# Patient Record
Sex: Female | Born: 1945
Health system: Southern US, Community
[De-identification: ages and names within clinical notes are randomized; demographics above are authoritative.]

## PROBLEM LIST (undated history)

## (undated) DIAGNOSIS — Z8701 Personal history of pneumonia (recurrent): Secondary | ICD-10-CM

## (undated) DIAGNOSIS — I4891 Unspecified atrial fibrillation: Secondary | ICD-10-CM

## (undated) DIAGNOSIS — Z87312 Personal history of (healed) stress fracture: Secondary | ICD-10-CM

## (undated) DIAGNOSIS — K56609 Unspecified intestinal obstruction, unspecified as to partial versus complete obstruction: Secondary | ICD-10-CM

## (undated) DIAGNOSIS — Z8614 Personal history of Methicillin resistant Staphylococcus aureus infection: Secondary | ICD-10-CM

## (undated) DIAGNOSIS — I420 Dilated cardiomyopathy: Secondary | ICD-10-CM

## (undated) DIAGNOSIS — I2510 Atherosclerotic heart disease of native coronary artery without angina pectoris: Secondary | ICD-10-CM

## (undated) DIAGNOSIS — M797 Fibromyalgia: Secondary | ICD-10-CM

## (undated) DIAGNOSIS — I10 Essential (primary) hypertension: Secondary | ICD-10-CM

## (undated) DIAGNOSIS — K3184 Gastroparesis: Secondary | ICD-10-CM

## (undated) DIAGNOSIS — K5792 Diverticulitis of intestine, part unspecified, without perforation or abscess without bleeding: Secondary | ICD-10-CM

## (undated) DIAGNOSIS — S42402A Unspecified fracture of lower end of left humerus, initial encounter for closed fracture: Secondary | ICD-10-CM

## (undated) DIAGNOSIS — J189 Pneumonia, unspecified organism: Secondary | ICD-10-CM

## (undated) DIAGNOSIS — M1990 Unspecified osteoarthritis, unspecified site: Secondary | ICD-10-CM

## (undated) DIAGNOSIS — D631 Anemia in chronic kidney disease: Secondary | ICD-10-CM

## (undated) DIAGNOSIS — T17900A Unspecified foreign body in respiratory tract, part unspecified causing asphyxiation, initial encounter: Secondary | ICD-10-CM

## (undated) DIAGNOSIS — J449 Chronic obstructive pulmonary disease, unspecified: Secondary | ICD-10-CM

## (undated) DIAGNOSIS — J90 Pleural effusion, not elsewhere classified: Secondary | ICD-10-CM

## (undated) DIAGNOSIS — E538 Deficiency of other specified B group vitamins: Secondary | ICD-10-CM

## (undated) DIAGNOSIS — D649 Anemia, unspecified: Secondary | ICD-10-CM

## (undated) DIAGNOSIS — Z950 Presence of cardiac pacemaker: Secondary | ICD-10-CM

## (undated) DIAGNOSIS — Z9981 Dependence on supplemental oxygen: Secondary | ICD-10-CM

## (undated) DIAGNOSIS — I447 Left bundle-branch block, unspecified: Secondary | ICD-10-CM

## (undated) DIAGNOSIS — I509 Heart failure, unspecified: Secondary | ICD-10-CM

## (undated) DIAGNOSIS — K759 Inflammatory liver disease, unspecified: Secondary | ICD-10-CM

## (undated) DIAGNOSIS — Z8719 Personal history of other diseases of the digestive system: Secondary | ICD-10-CM

## (undated) DIAGNOSIS — K909 Intestinal malabsorption, unspecified: Secondary | ICD-10-CM

## (undated) DIAGNOSIS — I82B19 Acute embolism and thrombosis of unspecified subclavian vein: Secondary | ICD-10-CM

## (undated) DIAGNOSIS — Z973 Presence of spectacles and contact lenses: Secondary | ICD-10-CM

## (undated) DIAGNOSIS — Z8711 Personal history of peptic ulcer disease: Secondary | ICD-10-CM

## (undated) DIAGNOSIS — K219 Gastro-esophageal reflux disease without esophagitis: Secondary | ICD-10-CM

## (undated) DIAGNOSIS — G2581 Restless legs syndrome: Secondary | ICD-10-CM

## (undated) HISTORY — DX: Left bundle-branch block, unspecified: I44.7

## (undated) HISTORY — PX: GASTROCUTANEOUS FISTULA CLOSURE: SHX1695

## (undated) HISTORY — DX: Dilated cardiomyopathy: I42.0

## (undated) HISTORY — DX: Atherosclerotic heart disease of native coronary artery without angina pectoris: I25.10

## (undated) HISTORY — DX: Presence of cardiac pacemaker: Z95.0

## (undated) HISTORY — PX: INTERSTIM IMPLANT PLACEMENT: SHX5130

## (undated) HISTORY — DX: Gastroparesis: K31.84

## (undated) HISTORY — DX: Acute embolism and thrombosis of unspecified subclavian vein: I82.B19

## (undated) HISTORY — DX: Gastro-esophageal reflux disease without esophagitis: K21.9

## (undated) HISTORY — PX: CARDIAC CATHETERIZATION: SHX172

## (undated) HISTORY — DX: Anemia in chronic kidney disease: D63.1

## (undated) HISTORY — DX: Deficiency of other specified B group vitamins: E53.8

## (undated) HISTORY — DX: Chronic obstructive pulmonary disease, unspecified: J44.9

## (undated) HISTORY — DX: Personal history of other diseases of the digestive system: Z87.19

## (undated) HISTORY — PX: APPENDECTOMY: SHX54

## (undated) HISTORY — DX: Intestinal malabsorption, unspecified: K90.9

## (undated) HISTORY — DX: Fibromyalgia: M79.7

## (undated) HISTORY — DX: Unspecified foreign body in respiratory tract, part unspecified causing asphyxiation, initial encounter: T17.900A

## (undated) HISTORY — DX: Unspecified atrial fibrillation: I48.91

## (undated) HISTORY — PX: TOTAL ABDOMINAL HYSTERECTOMY: SHX209

## (undated) HISTORY — DX: Dependence on supplemental oxygen: Z99.81

## (undated) HISTORY — DX: Unspecified intestinal obstruction, unspecified as to partial versus complete obstruction: K56.609

## (undated) HISTORY — DX: Pleural effusion, not elsewhere classified: J90

## (undated) HISTORY — DX: Essential (primary) hypertension: I10

## (undated) HISTORY — DX: Anemia, unspecified: D64.9

## (undated) HISTORY — DX: Pneumonia, unspecified organism: J18.9

## (undated) HISTORY — PX: TONSILLECTOMY: SUR1361

## (undated) HISTORY — PX: OTHER SURGICAL HISTORY: SHX169

## (undated) HISTORY — DX: Restless legs syndrome: G25.81

## (undated) HISTORY — PX: COLECTOMY: SHX59

## (undated) SURGERY — Surgical Case
Anesthesia: *Unknown

---

## 1997-07-27 ENCOUNTER — Inpatient Hospital Stay (HOSPITAL_COMMUNITY): Admission: EM | Admit: 1997-07-27 | Discharge: 1997-08-03 | Payer: Self-pay | Admitting: Emergency Medicine

## 1998-03-12 ENCOUNTER — Inpatient Hospital Stay (HOSPITAL_COMMUNITY): Admission: EM | Admit: 1998-03-12 | Discharge: 1998-03-18 | Payer: Self-pay | Admitting: *Deleted

## 1998-03-12 ENCOUNTER — Encounter: Payer: Self-pay | Admitting: General Surgery

## 1998-03-13 ENCOUNTER — Encounter: Payer: Self-pay | Admitting: General Surgery

## 1998-03-15 ENCOUNTER — Encounter: Payer: Self-pay | Admitting: General Surgery

## 1998-03-16 ENCOUNTER — Encounter: Payer: Self-pay | Admitting: General Surgery

## 1998-03-17 ENCOUNTER — Encounter: Payer: Self-pay | Admitting: General Surgery

## 1998-11-15 ENCOUNTER — Encounter: Payer: Self-pay | Admitting: Internal Medicine

## 1998-12-13 ENCOUNTER — Inpatient Hospital Stay (HOSPITAL_COMMUNITY): Admission: EM | Admit: 1998-12-13 | Discharge: 1998-12-17 | Payer: Self-pay | Admitting: Emergency Medicine

## 1998-12-13 ENCOUNTER — Encounter: Payer: Self-pay | Admitting: Emergency Medicine

## 1998-12-14 ENCOUNTER — Encounter: Payer: Self-pay | Admitting: General Surgery

## 1998-12-15 ENCOUNTER — Encounter: Payer: Self-pay | Admitting: Internal Medicine

## 1999-01-23 ENCOUNTER — Other Ambulatory Visit: Admission: RE | Admit: 1999-01-23 | Discharge: 1999-01-23 | Payer: Self-pay | Admitting: *Deleted

## 1999-04-27 ENCOUNTER — Encounter: Payer: Self-pay | Admitting: Emergency Medicine

## 1999-04-27 ENCOUNTER — Inpatient Hospital Stay (HOSPITAL_COMMUNITY): Admission: EM | Admit: 1999-04-27 | Discharge: 1999-04-30 | Payer: Self-pay | Admitting: Emergency Medicine

## 1999-04-29 ENCOUNTER — Encounter: Payer: Self-pay | Admitting: General Surgery

## 1999-11-22 ENCOUNTER — Inpatient Hospital Stay (HOSPITAL_COMMUNITY): Admission: EM | Admit: 1999-11-22 | Discharge: 1999-11-27 | Payer: Self-pay | Admitting: Emergency Medicine

## 1999-11-22 ENCOUNTER — Encounter: Payer: Self-pay | Admitting: General Surgery

## 1999-11-23 ENCOUNTER — Encounter: Payer: Self-pay | Admitting: General Surgery

## 1999-11-24 ENCOUNTER — Encounter: Payer: Self-pay | Admitting: General Surgery

## 1999-12-18 ENCOUNTER — Ambulatory Visit (HOSPITAL_COMMUNITY): Admission: RE | Admit: 1999-12-18 | Discharge: 1999-12-18 | Payer: Self-pay | Admitting: General Surgery

## 1999-12-18 ENCOUNTER — Encounter: Payer: Self-pay | Admitting: General Surgery

## 2000-01-25 ENCOUNTER — Encounter (INDEPENDENT_AMBULATORY_CARE_PROVIDER_SITE_OTHER): Payer: Self-pay | Admitting: Specialist

## 2000-01-25 ENCOUNTER — Inpatient Hospital Stay (HOSPITAL_COMMUNITY): Admission: RE | Admit: 2000-01-25 | Discharge: 2000-02-04 | Payer: Self-pay | Admitting: General Surgery

## 2000-01-25 ENCOUNTER — Encounter: Payer: Self-pay | Admitting: Anesthesiology

## 2000-01-28 ENCOUNTER — Encounter: Payer: Self-pay | Admitting: Surgery

## 2000-02-04 HISTORY — PX: COLECTOMY: SHX59

## 2000-08-05 ENCOUNTER — Other Ambulatory Visit: Admission: RE | Admit: 2000-08-05 | Discharge: 2000-08-05 | Payer: Self-pay | Admitting: *Deleted

## 2001-01-24 ENCOUNTER — Ambulatory Visit (HOSPITAL_COMMUNITY): Admission: RE | Admit: 2001-01-24 | Discharge: 2001-01-24 | Payer: Self-pay | Admitting: Gastroenterology

## 2001-01-24 ENCOUNTER — Encounter: Payer: Self-pay | Admitting: Internal Medicine

## 2001-02-26 ENCOUNTER — Inpatient Hospital Stay (HOSPITAL_COMMUNITY): Admission: EM | Admit: 2001-02-26 | Discharge: 2001-03-03 | Payer: Self-pay | Admitting: Emergency Medicine

## 2001-02-28 ENCOUNTER — Encounter: Payer: Self-pay | Admitting: General Surgery

## 2001-03-28 ENCOUNTER — Encounter (INDEPENDENT_AMBULATORY_CARE_PROVIDER_SITE_OTHER): Payer: Self-pay | Admitting: Specialist

## 2001-03-28 ENCOUNTER — Ambulatory Visit (HOSPITAL_COMMUNITY): Admission: RE | Admit: 2001-03-28 | Discharge: 2001-03-28 | Payer: Self-pay | Admitting: Hematology and Oncology

## 2001-03-30 ENCOUNTER — Encounter: Payer: Self-pay | Admitting: Emergency Medicine

## 2001-03-30 ENCOUNTER — Inpatient Hospital Stay (HOSPITAL_COMMUNITY): Admission: EM | Admit: 2001-03-30 | Discharge: 2001-04-08 | Payer: Self-pay | Admitting: Emergency Medicine

## 2001-03-31 ENCOUNTER — Encounter: Payer: Self-pay | Admitting: Internal Medicine

## 2001-04-01 ENCOUNTER — Encounter: Payer: Self-pay | Admitting: Internal Medicine

## 2001-05-06 ENCOUNTER — Encounter: Payer: Self-pay | Admitting: *Deleted

## 2001-05-06 ENCOUNTER — Emergency Department (HOSPITAL_COMMUNITY): Admission: EM | Admit: 2001-05-06 | Discharge: 2001-05-06 | Payer: Self-pay | Admitting: *Deleted

## 2001-05-07 ENCOUNTER — Encounter (INDEPENDENT_AMBULATORY_CARE_PROVIDER_SITE_OTHER): Payer: Self-pay | Admitting: *Deleted

## 2001-05-07 ENCOUNTER — Inpatient Hospital Stay (HOSPITAL_COMMUNITY): Admission: EM | Admit: 2001-05-07 | Discharge: 2001-05-31 | Payer: Self-pay | Admitting: Emergency Medicine

## 2001-05-07 ENCOUNTER — Encounter (INDEPENDENT_AMBULATORY_CARE_PROVIDER_SITE_OTHER): Payer: Self-pay | Admitting: Specialist

## 2001-05-07 ENCOUNTER — Encounter: Payer: Self-pay | Admitting: Family Medicine

## 2001-05-08 ENCOUNTER — Encounter (HOSPITAL_BASED_OUTPATIENT_CLINIC_OR_DEPARTMENT_OTHER): Payer: Self-pay | Admitting: General Surgery

## 2001-05-10 ENCOUNTER — Encounter (HOSPITAL_BASED_OUTPATIENT_CLINIC_OR_DEPARTMENT_OTHER): Payer: Self-pay | Admitting: General Surgery

## 2001-05-10 ENCOUNTER — Encounter (INDEPENDENT_AMBULATORY_CARE_PROVIDER_SITE_OTHER): Payer: Self-pay | Admitting: *Deleted

## 2001-05-20 ENCOUNTER — Encounter (HOSPITAL_BASED_OUTPATIENT_CLINIC_OR_DEPARTMENT_OTHER): Payer: Self-pay | Admitting: General Surgery

## 2001-05-20 HISTORY — PX: SMALL INTESTINE SURGERY: SHX150

## 2002-03-17 ENCOUNTER — Encounter: Payer: Self-pay | Admitting: Family Medicine

## 2002-03-17 ENCOUNTER — Ambulatory Visit (HOSPITAL_COMMUNITY): Admission: RE | Admit: 2002-03-17 | Discharge: 2002-03-17 | Payer: Self-pay | Admitting: Family Medicine

## 2003-08-24 ENCOUNTER — Other Ambulatory Visit: Admission: RE | Admit: 2003-08-24 | Discharge: 2003-08-24 | Payer: Self-pay | Admitting: Family Medicine

## 2004-01-08 ENCOUNTER — Ambulatory Visit: Payer: Self-pay | Admitting: Hematology & Oncology

## 2004-02-24 ENCOUNTER — Encounter (INDEPENDENT_AMBULATORY_CARE_PROVIDER_SITE_OTHER): Payer: Self-pay | Admitting: *Deleted

## 2004-02-24 ENCOUNTER — Encounter: Admission: RE | Admit: 2004-02-24 | Discharge: 2004-02-24 | Payer: Self-pay | Admitting: Gastroenterology

## 2004-03-23 ENCOUNTER — Ambulatory Visit: Payer: Self-pay | Admitting: Hematology & Oncology

## 2004-04-20 ENCOUNTER — Inpatient Hospital Stay (HOSPITAL_COMMUNITY): Admission: EM | Admit: 2004-04-20 | Discharge: 2004-04-22 | Payer: Self-pay | Admitting: Emergency Medicine

## 2004-05-17 ENCOUNTER — Ambulatory Visit: Payer: Self-pay | Admitting: Hematology & Oncology

## 2004-07-18 ENCOUNTER — Ambulatory Visit: Payer: Self-pay | Admitting: Hematology & Oncology

## 2004-10-27 ENCOUNTER — Ambulatory Visit: Payer: Self-pay | Admitting: Hematology & Oncology

## 2004-12-22 IMAGING — RF DG ESOPHAGUS
12 series · 20 of 21 positions shown · non-contrast
Comparison: none

CLINICAL DATA: Dysphagia. 
 BARIUM SWALLOW: 
 Initially double contrast barium swallow was performed.  The mucosa of the esophagus appears normal.  Single contrast study shows the neuromuscular swallowing mechanism to appear normal.  Esophageal peristalsis is normal.  There is a small sliding hiatal hernia present with valsalva maneuver.  No reflux is seen and a barium pill passes into the stomach without delay.

[Series 2: run · 1 of 1 slices shown (1 of 12)]
[im 1/1]
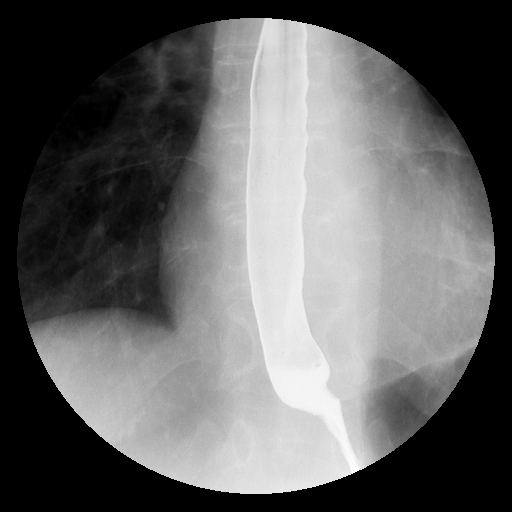

[Series 3: run · 1 of 1 slices shown (2 of 12)]
[im 1/1]
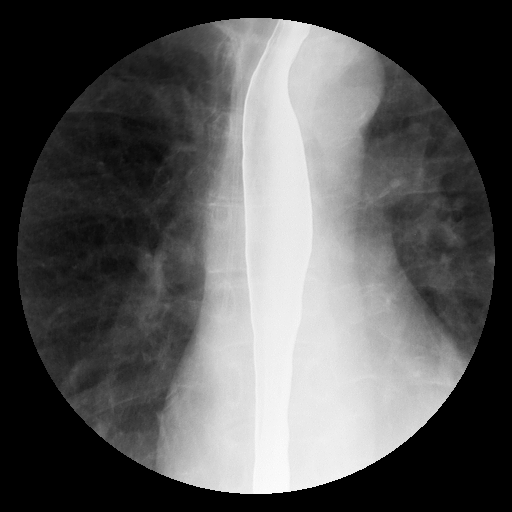

[Series 5: run · 1 of 1 slices shown (3 of 12)]
[im 1/1]
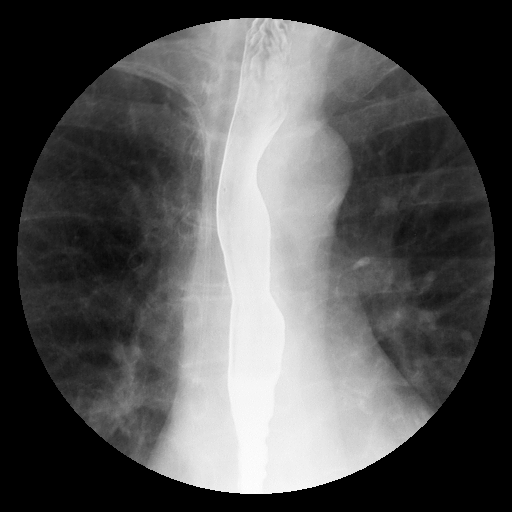

[Series 6: run · 6 of 6 slices shown (4 of 12)]
[im 1/6]
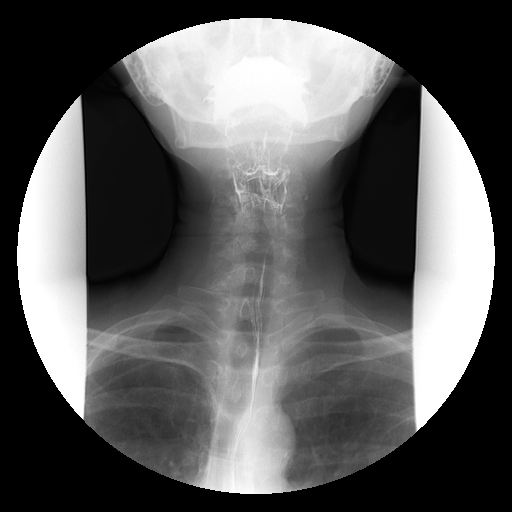
[im 2/6]
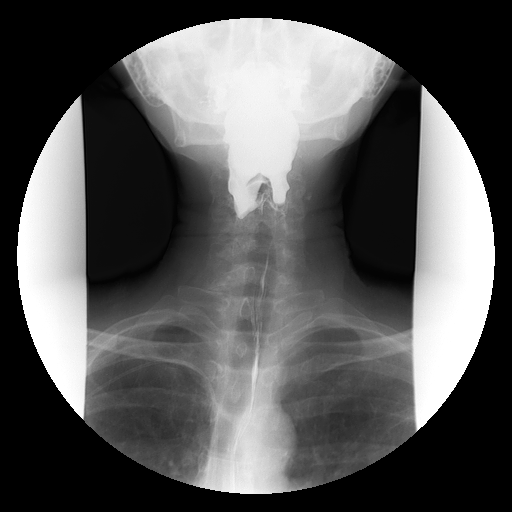
[im 3/6]
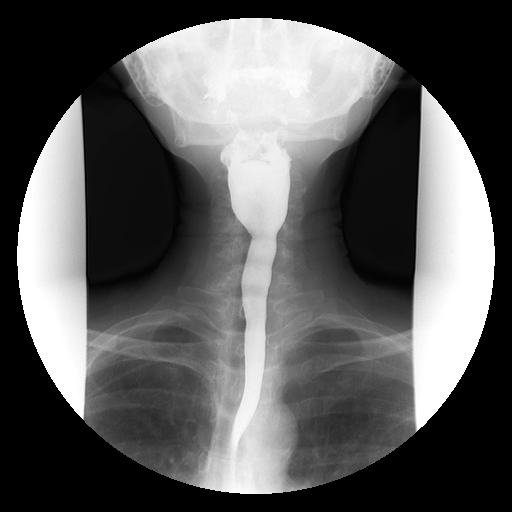
[im 4/6]
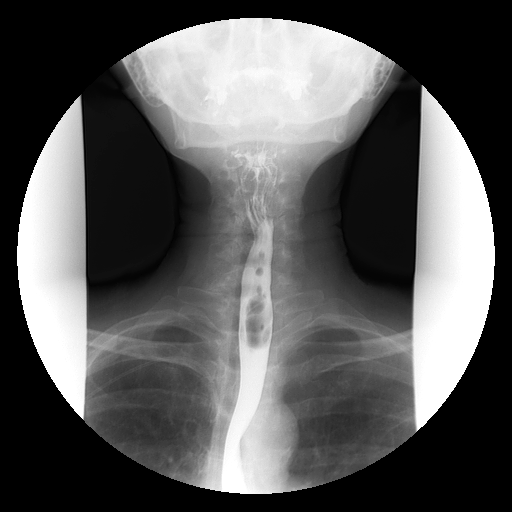
[im 5/6]
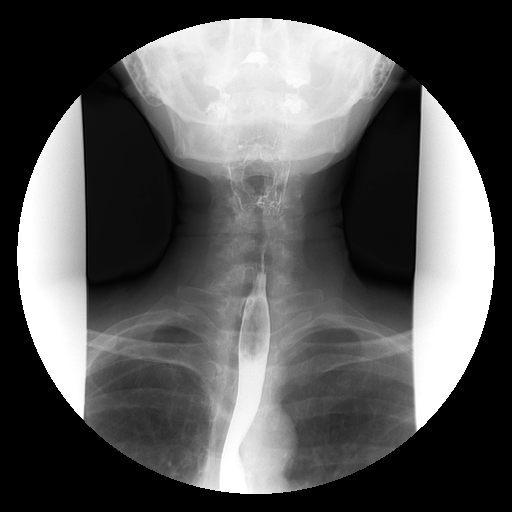
[im 6/6]
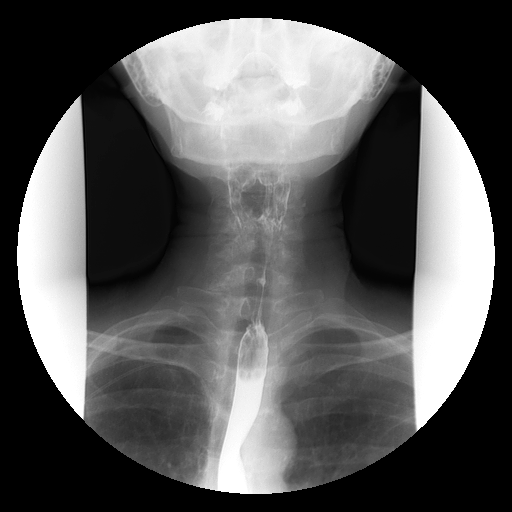

[Series 7: run · 4 of 5 slices shown (5 of 12)]
[im 1/5]
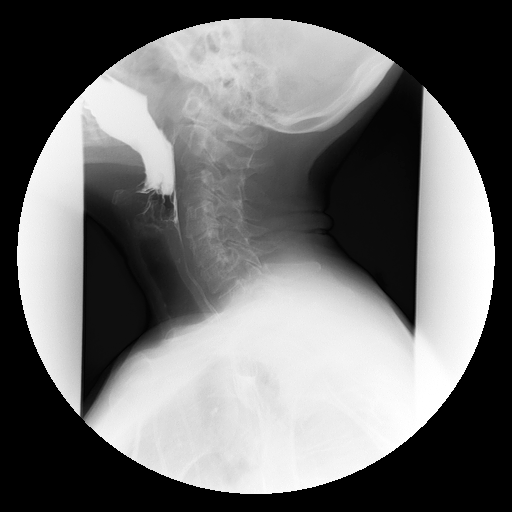
[im 3/5]
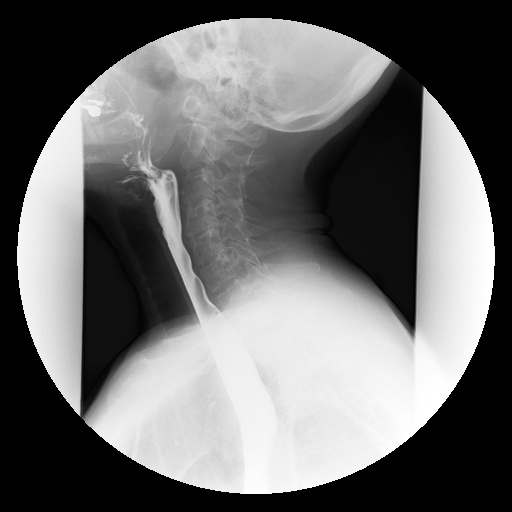
[im 4/5]
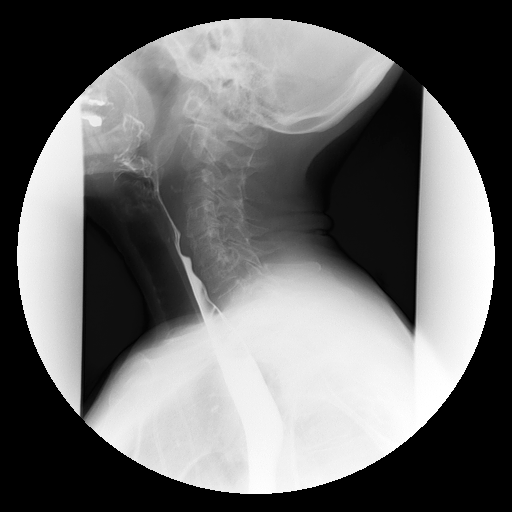
[im 5/5]
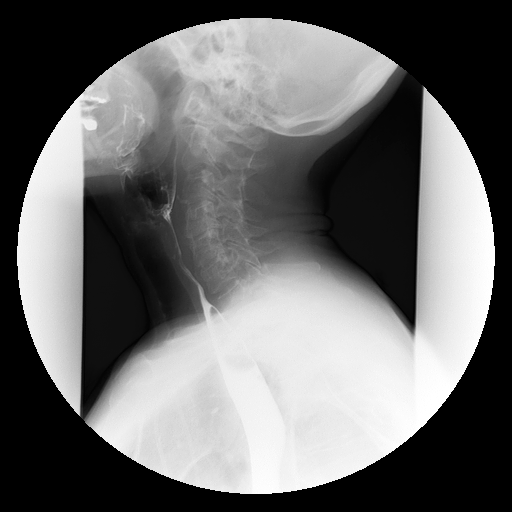

[Series 8: run · 1 of 1 slices shown (6 of 12)]
[im 1/1]
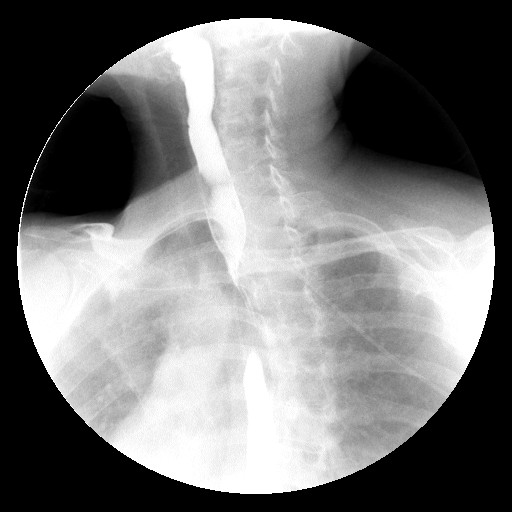

[Series 9: run · 1 of 1 slices shown (7 of 12)]
[im 1/1]
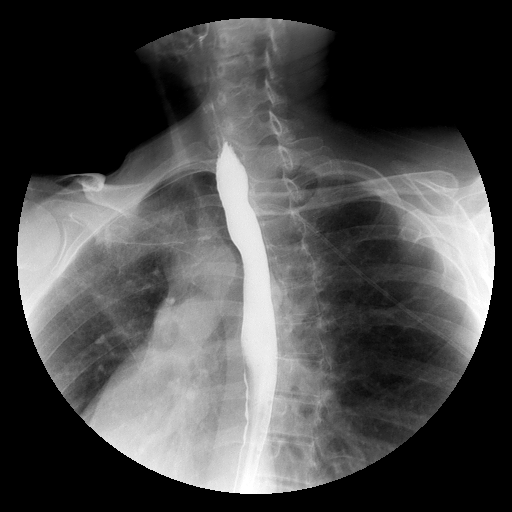

[Series 10: run · 1 of 1 slices shown (8 of 12)]
[im 1/1]
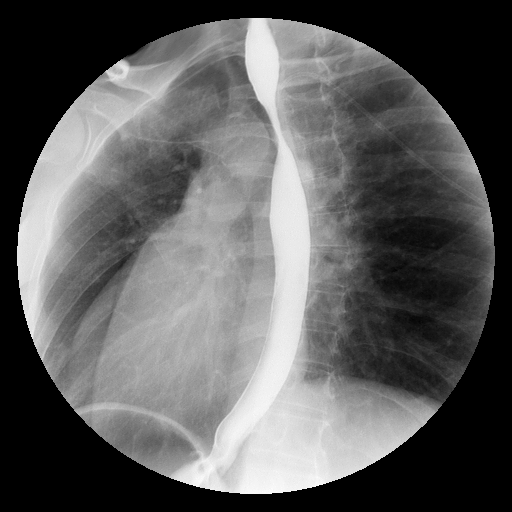

[Series 11: run · 1 of 1 slices shown (9 of 12)]
[im 1/1]
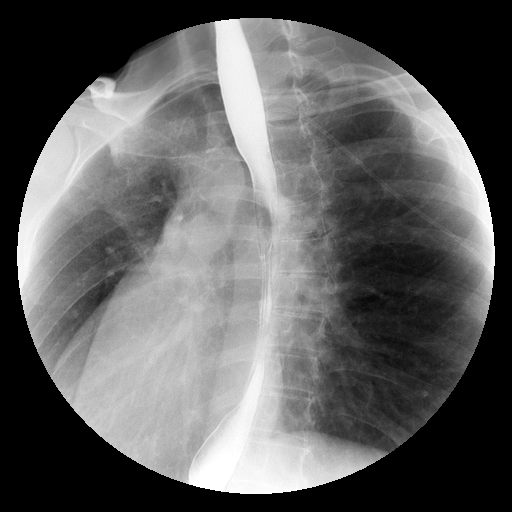

[Series 12: run · 1 of 1 slices shown (10 of 12)]
[im 1/1]
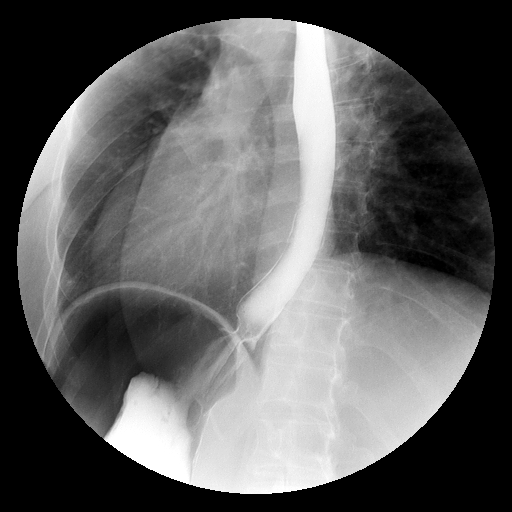

[Series 13: run · 1 of 1 slices shown (11 of 12)]
[im 1/1]
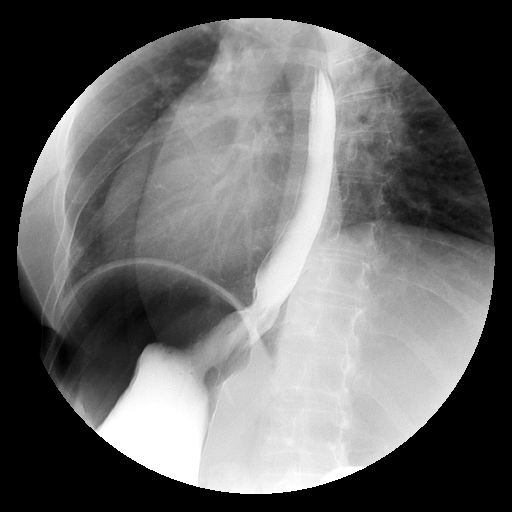

[Series 14: run · 1 of 1 slices shown (12 of 12)]
[im 1/1]
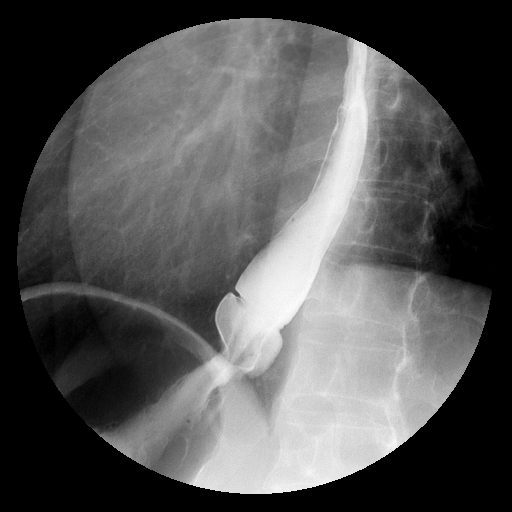

[20 of 21 positions shown; findings below may reference images not displayed]

IMPRESSION: 1.  Small sliding hiatal hernia.  No reflux is seen.  Barium pill passes into stomach without delay.

## 2005-02-17 IMAGING — CR DG CHEST 1V PORT
1 series · 1 of 1 positions shown · non-contrast
Comparison: none

CLINICAL DATA: Cellulitis of the nose, PICC line placement. 
 PORTABLE CHEST ? SINGLE VIEW:
 An AP semierect portable film of the chest made 04/21/04 at 1550 hours shows a new right PICC line to have been introduced.  The tip of the right PICC line is in the superior vena cava and estimated 3 cm above the right atrium. There is no pneumothorax. There remains the left central venous catheter which has not changed in position and again seen in the superior vena cava.  The lungs show diffuse peribronchial thickening and I cannot rule out an area of atelectasis or infiltrate at the right lung base.  It was not apparent on the previous study.  The heart and mediastinum within the normal limit.  Bony thorax is normal.

[view not recorded]
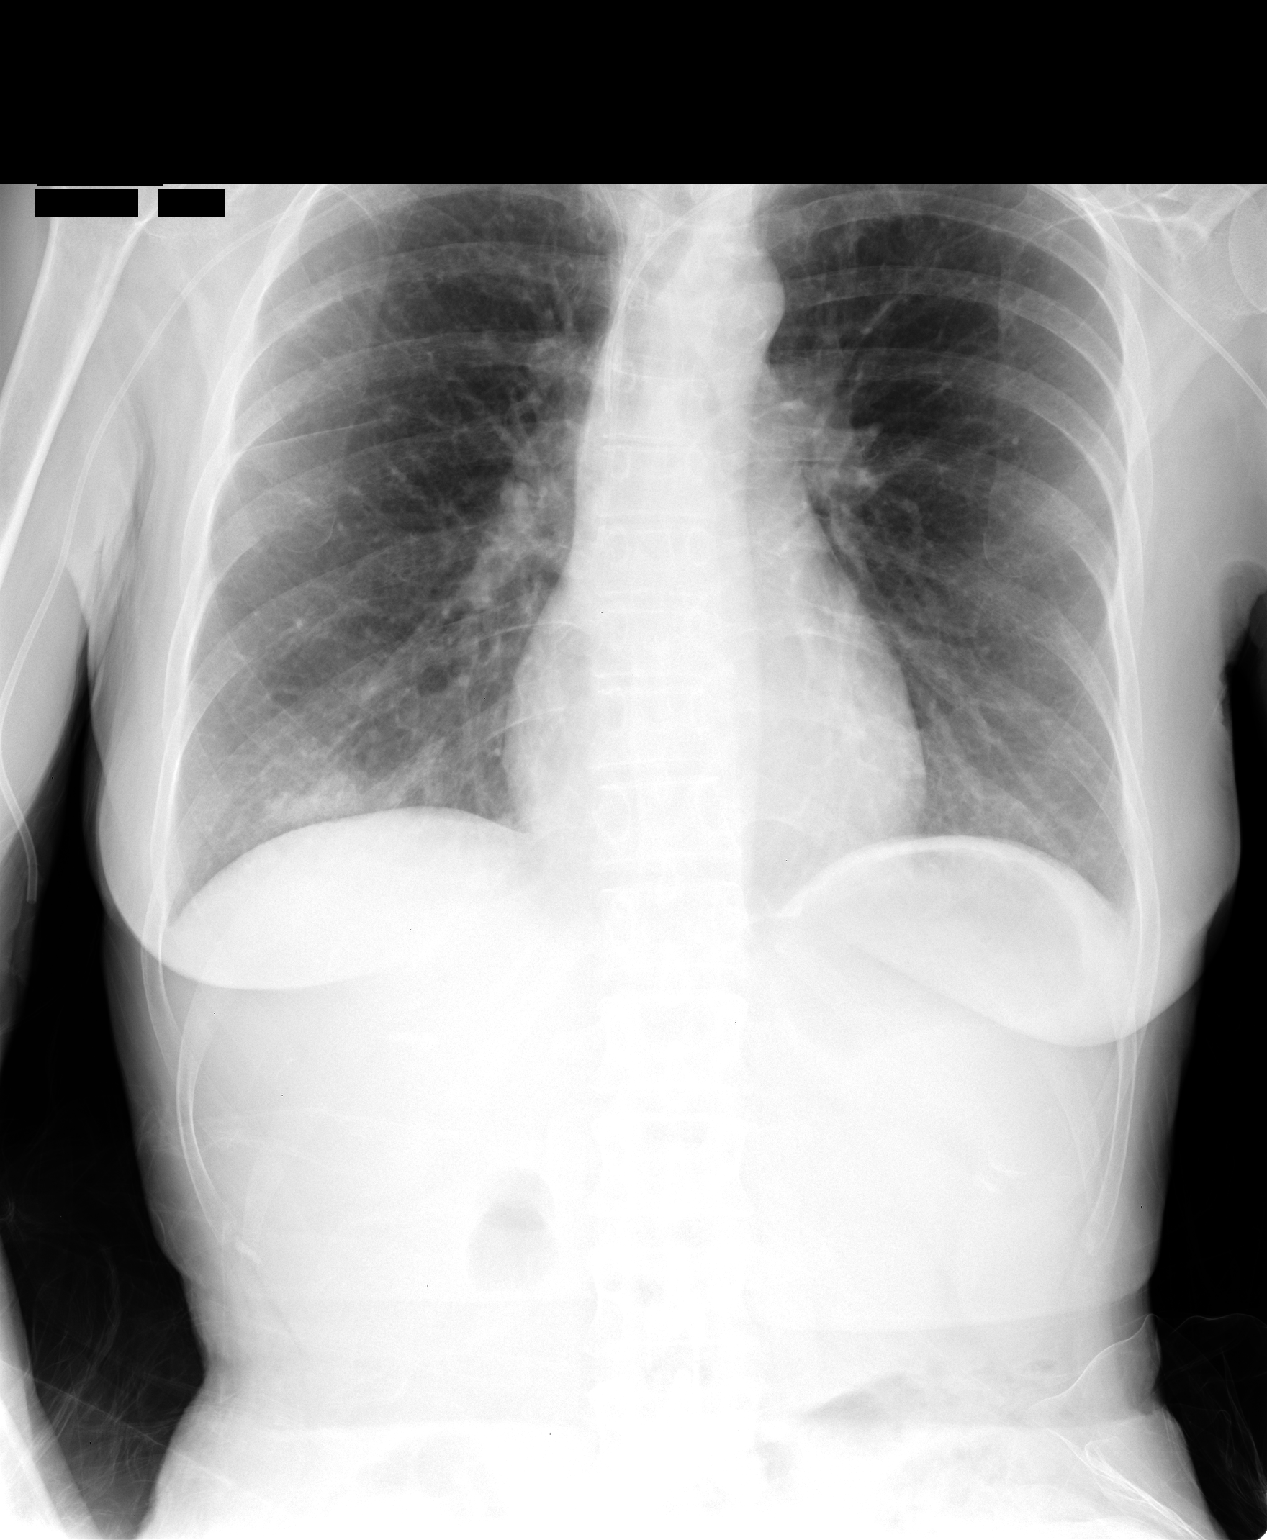

[1 of 1 positions shown; findings below may reference images not displayed]

IMPRESSION: 1.  PICC line tip appears to be in the superior vena cava estimated 3 cm above the right atrium.  No pneumothorax.  
 2.  There remains a left central venous catheter which has not changed. 
 3.  There is a vague area of increased density associated with the right lung base, possibility of both atelectasis/infiltrate Claudenilson be considered.

## 2005-05-14 ENCOUNTER — Ambulatory Visit: Payer: Self-pay | Admitting: Hematology & Oncology

## 2005-11-20 ENCOUNTER — Ambulatory Visit: Payer: Self-pay | Admitting: Family Medicine

## 2005-12-24 ENCOUNTER — Ambulatory Visit: Payer: Self-pay | Admitting: Family Medicine

## 2005-12-28 ENCOUNTER — Ambulatory Visit: Payer: Self-pay | Admitting: Internal Medicine

## 2005-12-31 ENCOUNTER — Ambulatory Visit (HOSPITAL_COMMUNITY): Admission: RE | Admit: 2005-12-31 | Discharge: 2005-12-31 | Payer: Self-pay | Admitting: Internal Medicine

## 2005-12-31 ENCOUNTER — Encounter (INDEPENDENT_AMBULATORY_CARE_PROVIDER_SITE_OTHER): Payer: Self-pay | Admitting: *Deleted

## 2006-01-01 ENCOUNTER — Encounter (INDEPENDENT_AMBULATORY_CARE_PROVIDER_SITE_OTHER): Payer: Self-pay | Admitting: *Deleted

## 2006-01-01 ENCOUNTER — Ambulatory Visit (HOSPITAL_COMMUNITY): Admission: RE | Admit: 2006-01-01 | Discharge: 2006-01-01 | Payer: Self-pay | Admitting: Internal Medicine

## 2006-01-15 ENCOUNTER — Ambulatory Visit: Payer: Self-pay | Admitting: Family Medicine

## 2006-01-15 LAB — CONVERTED CEMR LAB
BUN: 10 mg/dL (ref 6–23)
Creatinine, Ser: 1 mg/dL (ref 0.4–1.2)

## 2006-02-18 ENCOUNTER — Ambulatory Visit: Payer: Self-pay | Admitting: Family Medicine

## 2006-03-11 ENCOUNTER — Ambulatory Visit: Payer: Self-pay | Admitting: Family Medicine

## 2006-03-11 LAB — CONVERTED CEMR LAB
BUN: 14 mg/dL (ref 6–23)
CO2: 28 meq/L (ref 19–32)
Calcium: 8 mg/dL — ABNORMAL LOW (ref 8.4–10.5)
Chloride: 106 meq/L (ref 96–112)
Chol/HDL Ratio, serum: 3.6
Cholesterol: 139 mg/dL (ref 0–200)
Creatinine, Ser: 1.1 mg/dL (ref 0.4–1.2)
GFR calc non Af Amer: 54 mL/min
Glomerular Filtration Rate, Af Am: 65 mL/min/{1.73_m2}
Glucose, Bld: 104 mg/dL — ABNORMAL HIGH (ref 70–99)
HCT: 32.9 % — ABNORMAL LOW (ref 36.0–46.0)
HDL: 38.5 mg/dL — ABNORMAL LOW (ref >39.0)
Hemoglobin: 11.4 g/dL — ABNORMAL LOW (ref 12.0–15.0)
LDL Cholesterol: 85 mg/dL (ref 0–99)
MCHC: 34.6 g/dL (ref 30.0–36.0)
MCV: 88.9 fL (ref 78.0–100.0)
Platelets: 293 10*3/uL (ref 150–400)
Potassium: 3.9 meq/L (ref 3.5–5.1)
RBC: 3.7 M/uL — ABNORMAL LOW (ref 3.87–5.11)
RDW: 12.2 % (ref 11.5–14.6)
Sodium: 138 meq/L (ref 135–145)
TSH: 3.21 microintl units/mL (ref 0.35–5.50)
Triglyceride fasting, serum: 76 mg/dL (ref 0–149)
VLDL: 15 mg/dL (ref 0–40)
WBC: 10.2 10*3/uL (ref 4.5–10.5)

## 2006-03-18 ENCOUNTER — Ambulatory Visit: Payer: Self-pay | Admitting: Internal Medicine

## 2006-03-28 ENCOUNTER — Ambulatory Visit: Payer: Self-pay | Admitting: Family Medicine

## 2006-03-28 LAB — CONVERTED CEMR LAB: Calcium: 8.2 mg/dL — ABNORMAL LOW (ref 8.4–10.5)

## 2006-04-08 ENCOUNTER — Inpatient Hospital Stay (HOSPITAL_BASED_OUTPATIENT_CLINIC_OR_DEPARTMENT_OTHER): Admission: RE | Admit: 2006-04-08 | Discharge: 2006-04-08 | Payer: Self-pay | Admitting: Cardiology

## 2006-04-30 ENCOUNTER — Ambulatory Visit (HOSPITAL_BASED_OUTPATIENT_CLINIC_OR_DEPARTMENT_OTHER): Admission: RE | Admit: 2006-04-30 | Discharge: 2006-04-30 | Payer: Self-pay | Admitting: Urology

## 2006-04-30 HISTORY — PX: CYSTOSCOPY WITH INJECTION: SHX1424

## 2006-05-01 DIAGNOSIS — D509 Iron deficiency anemia, unspecified: Secondary | ICD-10-CM | POA: Insufficient documentation

## 2006-05-01 DIAGNOSIS — I10 Essential (primary) hypertension: Secondary | ICD-10-CM | POA: Insufficient documentation

## 2006-05-17 ENCOUNTER — Ambulatory Visit: Payer: Self-pay | Admitting: Family Medicine

## 2006-05-17 LAB — CONVERTED CEMR LAB: Calcium: 8.8 mg/dL (ref 8.4–10.5)

## 2006-05-28 ENCOUNTER — Ambulatory Visit (HOSPITAL_BASED_OUTPATIENT_CLINIC_OR_DEPARTMENT_OTHER): Admission: RE | Admit: 2006-05-28 | Discharge: 2006-05-28 | Payer: Self-pay | Admitting: Urology

## 2006-06-05 ENCOUNTER — Ambulatory Visit (HOSPITAL_COMMUNITY): Admission: RE | Admit: 2006-06-05 | Discharge: 2006-06-05 | Payer: Self-pay | Admitting: Urology

## 2006-07-18 ENCOUNTER — Ambulatory Visit: Payer: Self-pay | Admitting: Family Medicine

## 2006-07-18 LAB — CONVERTED CEMR LAB
BUN: 13 mg/dL (ref 6–23)
CO2: 29 meq/L (ref 19–32)
Calcium: 8.6 mg/dL (ref 8.4–10.5)
Chloride: 104 meq/L (ref 96–112)
Creatinine, Ser: 0.8 mg/dL (ref 0.4–1.2)
GFR calc Af Amer: 94 mL/min
GFR calc non Af Amer: 78 mL/min
Glucose, Bld: 90 mg/dL (ref 70–99)
Potassium: 4.3 meq/L (ref 3.5–5.1)
Sodium: 140 meq/L (ref 135–145)

## 2006-07-31 DIAGNOSIS — M1990 Unspecified osteoarthritis, unspecified site: Secondary | ICD-10-CM | POA: Insufficient documentation

## 2006-07-31 DIAGNOSIS — R5382 Chronic fatigue, unspecified: Secondary | ICD-10-CM | POA: Insufficient documentation

## 2006-07-31 DIAGNOSIS — R32 Unspecified urinary incontinence: Secondary | ICD-10-CM | POA: Insufficient documentation

## 2006-07-31 DIAGNOSIS — IMO0001 Reserved for inherently not codable concepts without codable children: Secondary | ICD-10-CM

## 2006-07-31 DIAGNOSIS — M797 Fibromyalgia: Secondary | ICD-10-CM | POA: Insufficient documentation

## 2006-07-31 DIAGNOSIS — G9332 Myalgic encephalomyelitis/chronic fatigue syndrome: Secondary | ICD-10-CM | POA: Insufficient documentation

## 2006-07-31 DIAGNOSIS — M5136 Other intervertebral disc degeneration, lumbar region: Secondary | ICD-10-CM | POA: Insufficient documentation

## 2006-08-01 ENCOUNTER — Encounter (INDEPENDENT_AMBULATORY_CARE_PROVIDER_SITE_OTHER): Payer: Self-pay | Admitting: Family Medicine

## 2006-08-01 ENCOUNTER — Ambulatory Visit: Payer: Self-pay | Admitting: Family Medicine

## 2006-08-01 ENCOUNTER — Encounter: Payer: Self-pay | Admitting: Family Medicine

## 2006-08-01 DIAGNOSIS — G2581 Restless legs syndrome: Secondary | ICD-10-CM | POA: Insufficient documentation

## 2006-08-01 DIAGNOSIS — G894 Chronic pain syndrome: Secondary | ICD-10-CM | POA: Insufficient documentation

## 2006-08-01 LAB — CONVERTED CEMR LAB
BUN: 15 mg/dL (ref 6–23)
CO2: 31 meq/L (ref 19–32)
Calcium: 8.8 mg/dL (ref 8.4–10.5)
Chloride: 101 meq/L (ref 96–112)
Creatinine, Ser: 0.9 mg/dL (ref 0.4–1.2)
GFR calc Af Amer: 82 mL/min
GFR calc non Af Amer: 68 mL/min
Glucose, Bld: 96 mg/dL (ref 70–99)
Potassium: 4.3 meq/L (ref 3.5–5.1)
Sodium: 138 meq/L (ref 135–145)

## 2006-08-16 ENCOUNTER — Ambulatory Visit: Payer: Self-pay | Admitting: Internal Medicine

## 2006-08-16 LAB — CONVERTED CEMR LAB
Albumin: 3.8 g/dL (ref 3.5–5.2)
BUN: 19 mg/dL (ref 6–23)
CO2: 33 meq/L — ABNORMAL HIGH (ref 19–32)
Calcium: 8.6 mg/dL (ref 8.4–10.5)
Chloride: 107 meq/L (ref 96–112)
Creatinine, Ser: 0.8 mg/dL (ref 0.4–1.2)
GFR calc Af Amer: 94 mL/min
GFR calc non Af Amer: 78 mL/min
Glucose, Bld: 95 mg/dL (ref 70–99)
Phosphorus: 4.5 mg/dL (ref 2.3–4.6)
Potassium: 5.2 meq/L — ABNORMAL HIGH (ref 3.5–5.1)
Prealbumin: 17.8 mg/dL — ABNORMAL LOW (ref 18.0–45.0)
Sodium: 143 meq/L (ref 135–145)

## 2006-09-05 ENCOUNTER — Ambulatory Visit: Payer: Self-pay | Admitting: Family Medicine

## 2006-09-08 LAB — CONVERTED CEMR LAB
BUN: 18 mg/dL (ref 6–23)
Basophils Absolute: 0.1 10*3/uL (ref 0.0–0.1)
Basophils Relative: 0.9 % (ref 0.0–1.0)
CO2: 32 meq/L (ref 19–32)
Calcium: 8.3 mg/dL — ABNORMAL LOW (ref 8.4–10.5)
Chloride: 107 meq/L (ref 96–112)
Creatinine, Ser: 0.8 mg/dL (ref 0.4–1.2)
Eosinophils Absolute: 0.1 10*3/uL (ref 0.0–0.6)
Eosinophils Relative: 1.1 % (ref 0.0–5.0)
GFR calc Af Amer: 94 mL/min
GFR calc non Af Amer: 78 mL/min
Glucose, Bld: 87 mg/dL (ref 70–99)
HCT: 32.9 % — ABNORMAL LOW (ref 36.0–46.0)
Hemoglobin: 11 g/dL — ABNORMAL LOW (ref 12.0–15.0)
Lymphocytes Relative: 7.1 % — ABNORMAL LOW (ref 12.0–46.0)
MCHC: 33.3 g/dL (ref 30.0–36.0)
MCV: 87.6 fL (ref 78.0–100.0)
Monocytes Absolute: 0.5 10*3/uL (ref 0.2–0.7)
Monocytes Relative: 8.3 % (ref 3.0–11.0)
Neutro Abs: 5.3 10*3/uL (ref 1.4–7.7)
Neutrophils Relative %: 82.6 % — ABNORMAL HIGH (ref 43.0–77.0)
Platelets: 262 10*3/uL (ref 150–400)
Potassium: 4.2 meq/L (ref 3.5–5.1)
RBC: 3.76 M/uL — ABNORMAL LOW (ref 3.87–5.11)
RDW: 11.9 % (ref 11.5–14.6)
Sodium: 141 meq/L (ref 135–145)
WBC: 6.5 10*3/uL (ref 4.5–10.5)

## 2006-09-09 ENCOUNTER — Telehealth (INDEPENDENT_AMBULATORY_CARE_PROVIDER_SITE_OTHER): Payer: Self-pay | Admitting: *Deleted

## 2006-09-16 ENCOUNTER — Ambulatory Visit: Payer: Self-pay | Admitting: Internal Medicine

## 2006-09-16 LAB — CONVERTED CEMR LAB
Albumin: 3.2 g/dL — ABNORMAL LOW (ref 3.5–5.2)
BUN: 10 mg/dL (ref 6–23)
CO2: 34 meq/L — ABNORMAL HIGH (ref 19–32)
Calcium: 8.8 mg/dL (ref 8.4–10.5)
Chloride: 100 meq/L (ref 96–112)
Creatinine, Ser: 0.8 mg/dL (ref 0.4–1.2)
GFR calc Af Amer: 94 mL/min
GFR calc non Af Amer: 78 mL/min
Glucose, Bld: 95 mg/dL (ref 70–99)
Phosphorus: 4.5 mg/dL (ref 2.3–4.6)
Potassium: 4.2 meq/L (ref 3.5–5.1)
Prealbumin: 17.1 mg/dL — ABNORMAL LOW (ref 18.0–45.0)
Sodium: 139 meq/L (ref 135–145)

## 2006-09-30 ENCOUNTER — Telehealth (INDEPENDENT_AMBULATORY_CARE_PROVIDER_SITE_OTHER): Payer: Self-pay | Admitting: *Deleted

## 2006-10-30 IMAGING — CR DG ABDOMEN 1V
3 series · 3 of 3 positions shown · non-contrast
Comparison: Small bowel follow-through of one day prior.

CLINICAL DATA: Yesterday?s small bowel follow-through demonstrated incomplete filling of the rectum. 
ABDOMEN ? 1 VIEW (3 IMAGES):

[t abdomen supine]
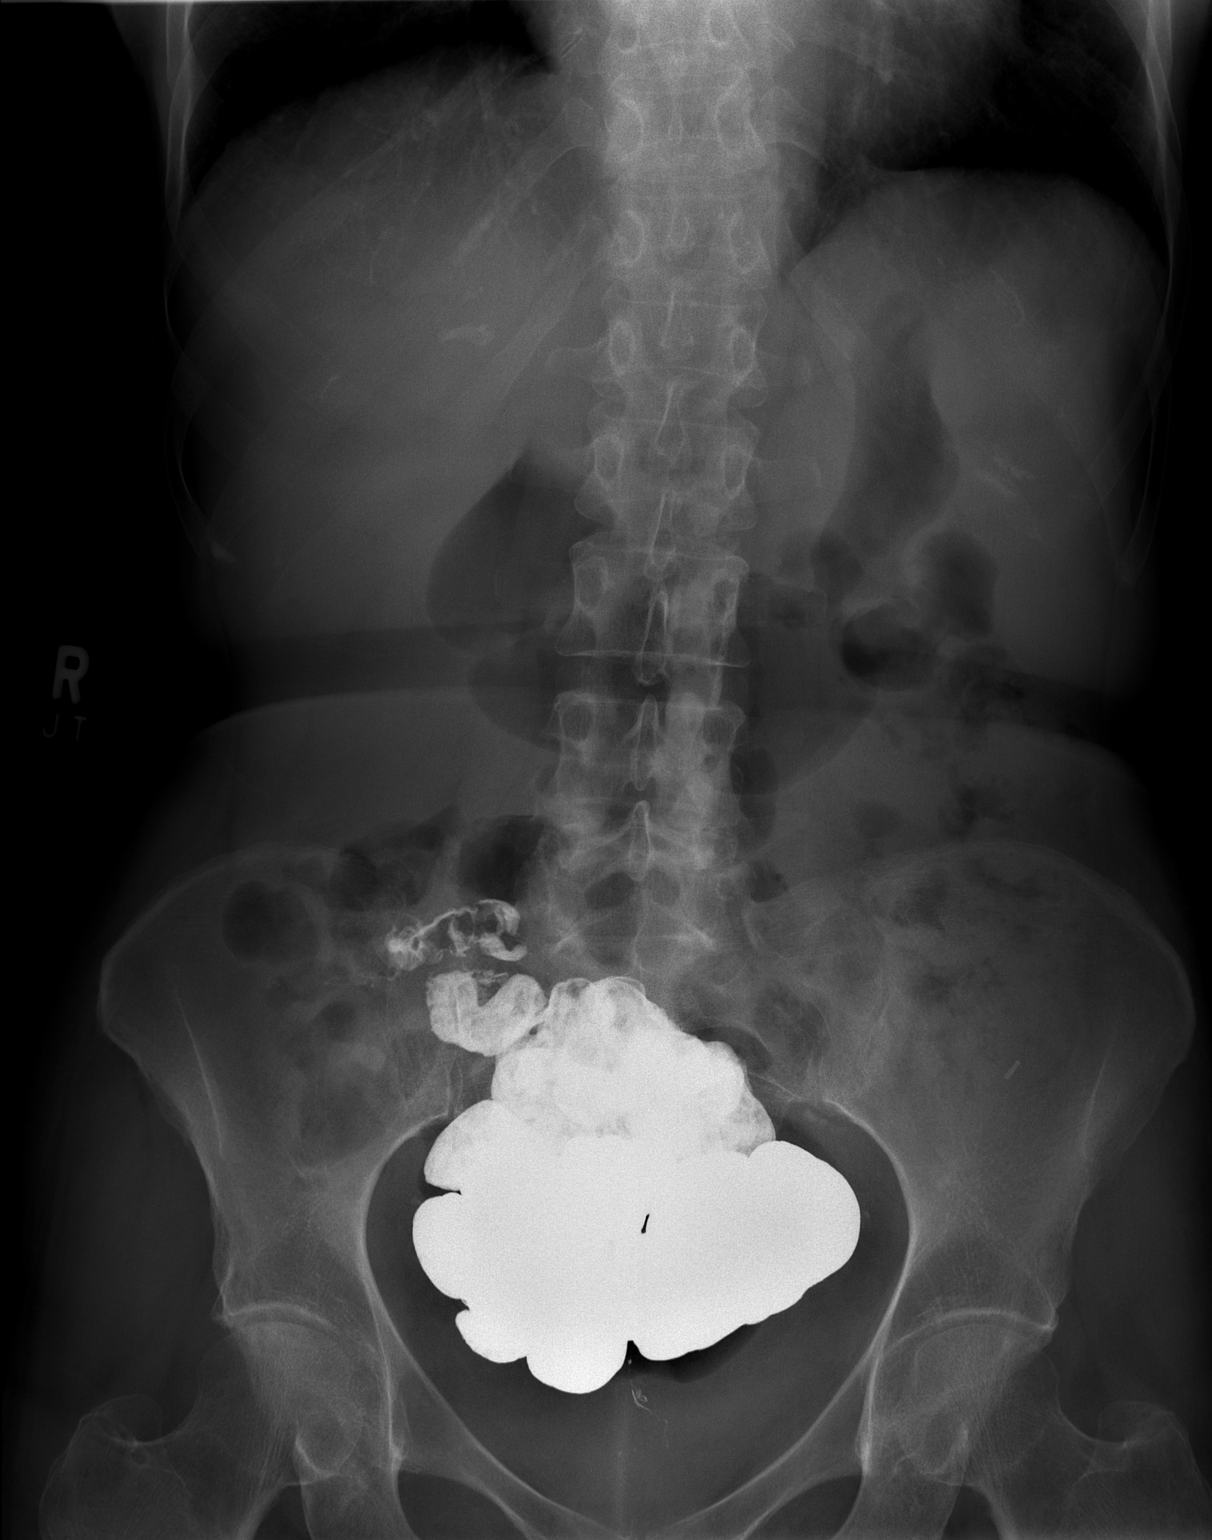

[t abdomen supine *]
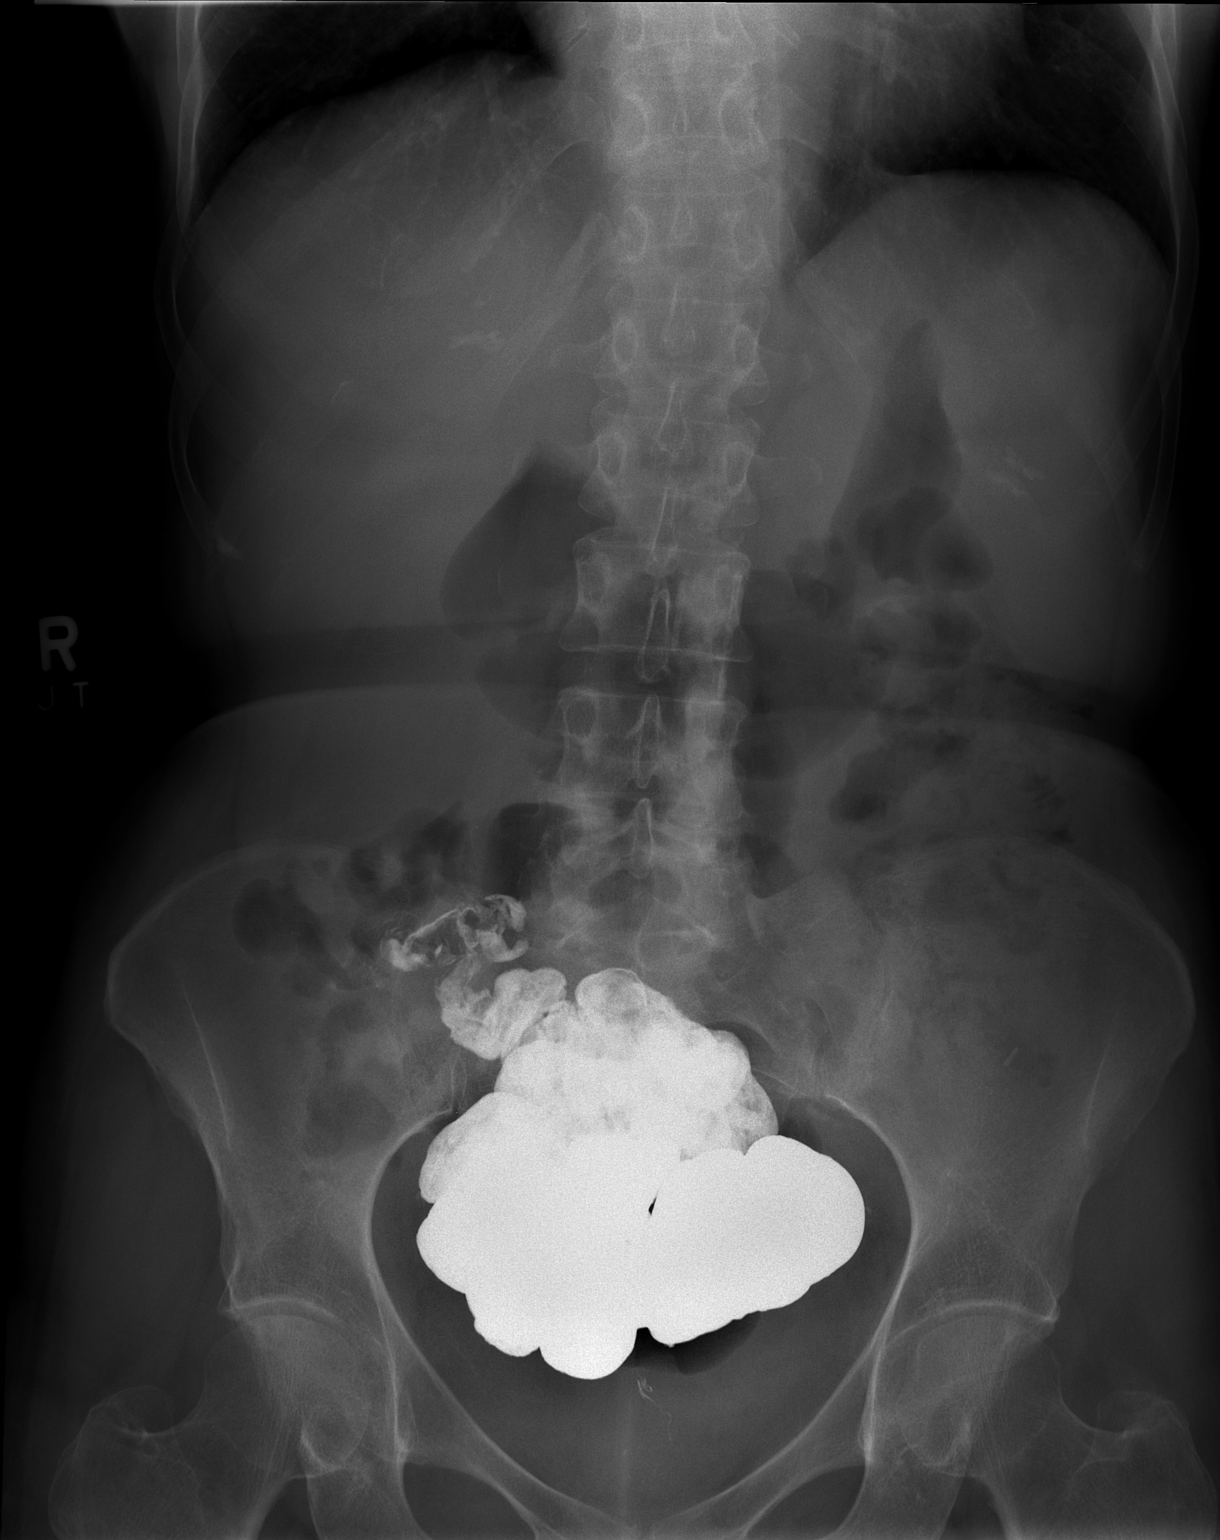

[view not recorded]
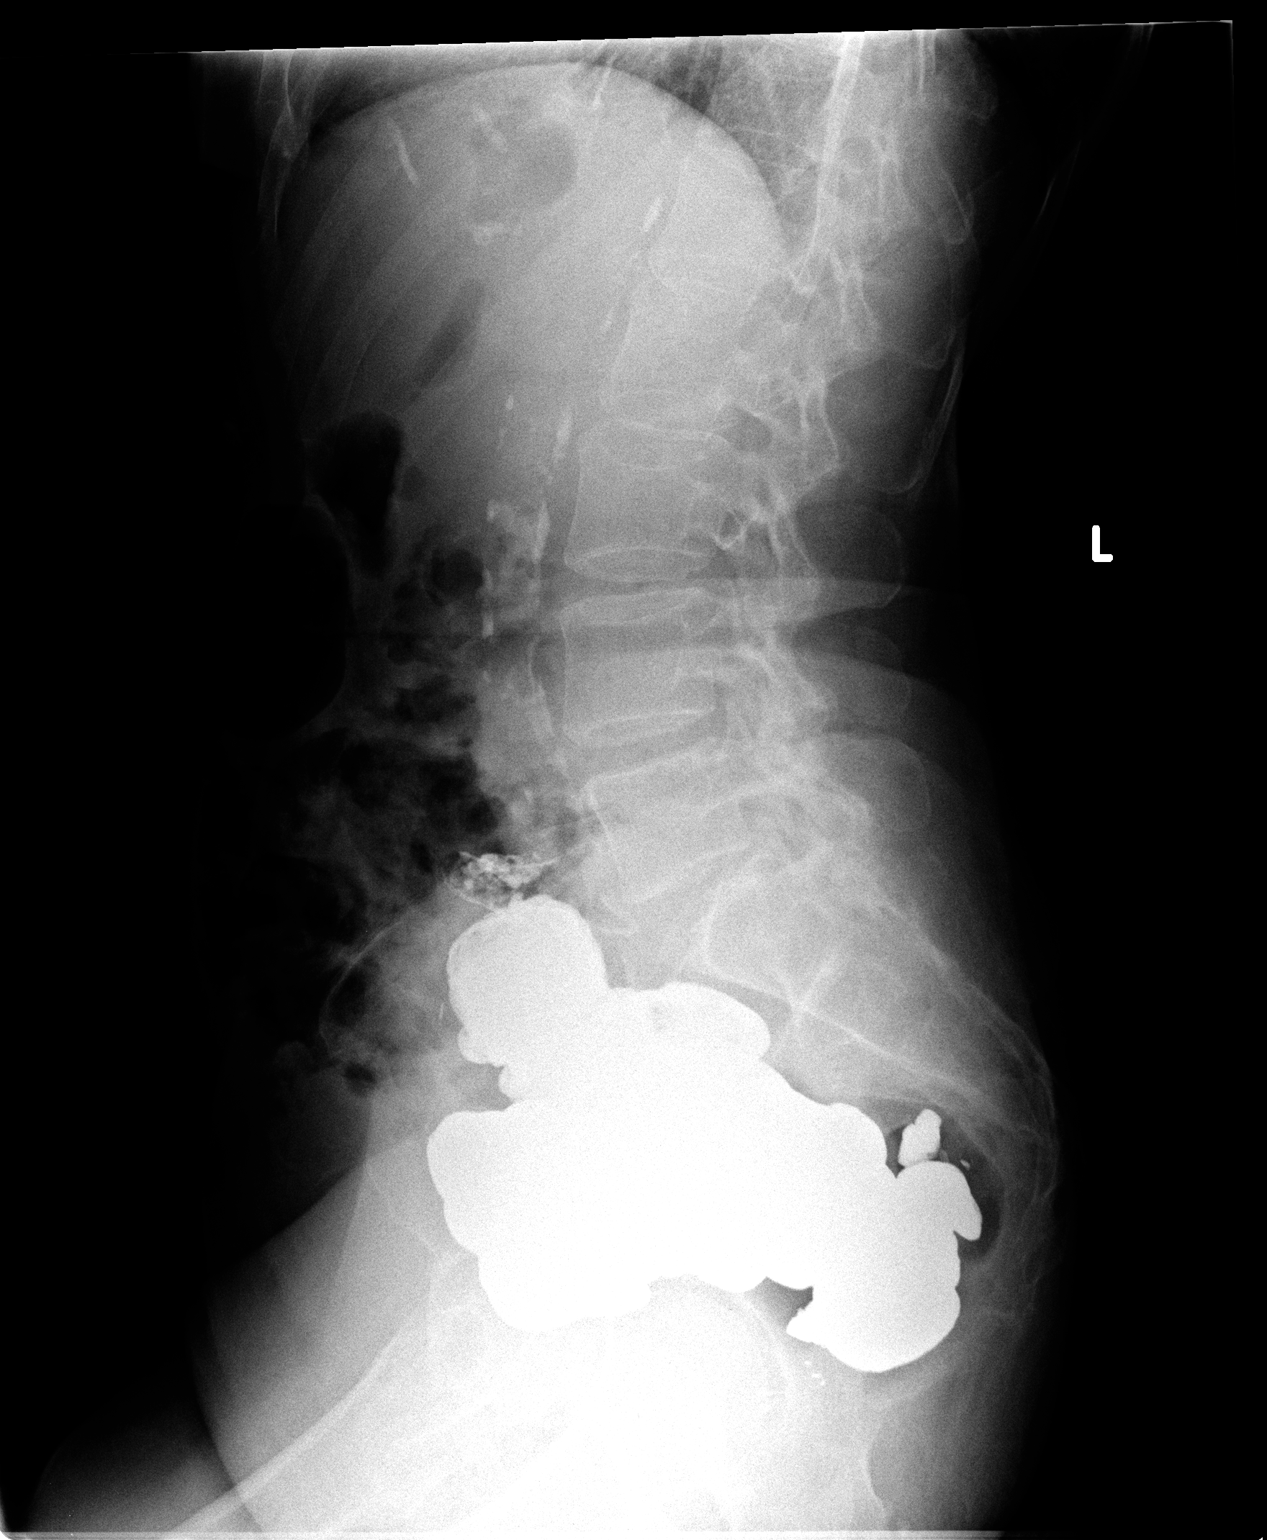

[3 of 3 positions shown; findings below may reference images not displayed]

FINDINGS: Two AP and one lateral view of the rectum demonstrate contrast filling the distal bowel.  This is likely to the level of the rectum.  The anus is not entirely filled.
IMPRESSION: Filling of the distal bowel loops, likely to the level of the rectum.

## 2006-10-31 ENCOUNTER — Encounter (INDEPENDENT_AMBULATORY_CARE_PROVIDER_SITE_OTHER): Payer: Self-pay | Admitting: *Deleted

## 2006-10-31 ENCOUNTER — Telehealth (INDEPENDENT_AMBULATORY_CARE_PROVIDER_SITE_OTHER): Payer: Self-pay | Admitting: *Deleted

## 2006-11-02 ENCOUNTER — Ambulatory Visit: Payer: Self-pay | Admitting: Hematology & Oncology

## 2006-11-06 ENCOUNTER — Encounter (INDEPENDENT_AMBULATORY_CARE_PROVIDER_SITE_OTHER): Payer: Self-pay | Admitting: Family Medicine

## 2006-11-06 LAB — FERRITIN: Ferritin: 466 ng/mL — ABNORMAL HIGH (ref 10–291)

## 2006-11-06 LAB — CBC & DIFF AND RETIC
BASO%: 0.7 % (ref 0.0–2.0)
Basophils Absolute: 0 10*3/uL (ref 0.0–0.1)
EOS%: 2.7 % (ref 0.0–7.0)
Eosinophils Absolute: 0.1 10*3/uL (ref 0.0–0.5)
HCT: 35.1 % (ref 34.8–46.6)
HGB: 12.1 g/dL (ref 11.6–15.9)
IRF: 0.26 (ref 0.130–0.330)
LYMPH%: 15.8 % (ref 14.0–48.0)
MCH: 29.7 pg (ref 26.0–34.0)
MCHC: 34.5 g/dL (ref 32.0–36.0)
MCV: 86.2 fL (ref 81.0–101.0)
MONO#: 0.4 10*3/uL (ref 0.1–0.9)
MONO%: 8 % (ref 0.0–13.0)
NEUT#: 4 10*3/uL (ref 1.5–6.5)
NEUT%: 72.8 % (ref 39.6–76.8)
Platelets: 255 10*3/uL (ref 145–400)
RBC: 4.07 10*6/uL (ref 3.70–5.32)
RDW: 13.3 % (ref 11.3–14.5)
RETIC #: 15.9 10*3/uL — ABNORMAL LOW (ref 19.7–115.1)
Retic %: 0.4 % (ref 0.4–2.3)
WBC: 5.5 10*3/uL (ref 3.9–10.0)
lymph#: 0.9 10*3/uL (ref 0.9–3.3)

## 2006-11-08 LAB — TRANSFERRIN RECEPTOR, SOLUABLE: Transferrin Receptor, Soluble: 9.7 nmol/L

## 2006-11-20 ENCOUNTER — Telehealth (INDEPENDENT_AMBULATORY_CARE_PROVIDER_SITE_OTHER): Payer: Self-pay | Admitting: *Deleted

## 2006-12-03 ENCOUNTER — Ambulatory Visit: Payer: Self-pay | Admitting: Family Medicine

## 2006-12-06 ENCOUNTER — Telehealth (INDEPENDENT_AMBULATORY_CARE_PROVIDER_SITE_OTHER): Payer: Self-pay | Admitting: *Deleted

## 2006-12-11 ENCOUNTER — Ambulatory Visit: Payer: Self-pay | Admitting: Internal Medicine

## 2006-12-23 ENCOUNTER — Ambulatory Visit: Payer: Self-pay | Admitting: Hematology & Oncology

## 2006-12-25 LAB — CBC & DIFF AND RETIC
BASO%: 0.5 % (ref 0.0–2.0)
Basophils Absolute: 0 10*3/uL (ref 0.0–0.1)
EOS%: 1.2 % (ref 0.0–7.0)
Eosinophils Absolute: 0.1 10*3/uL (ref 0.0–0.5)
HCT: 35.1 % (ref 34.8–46.6)
HGB: 11.9 g/dL (ref 11.6–15.9)
IRF: 0.33 (ref 0.130–0.330)
LYMPH%: 10.2 % — ABNORMAL LOW (ref 14.0–48.0)
MCH: 29.7 pg (ref 26.0–34.0)
MCHC: 34 g/dL (ref 32.0–36.0)
MCV: 87.5 fL (ref 81.0–101.0)
MONO#: 0.4 10*3/uL (ref 0.1–0.9)
MONO%: 6.3 % (ref 0.0–13.0)
NEUT#: 5.7 10*3/uL (ref 1.5–6.5)
NEUT%: 81.8 % — ABNORMAL HIGH (ref 39.6–76.8)
Platelets: 372 10*3/uL (ref 145–400)
RBC: 4.01 10*6/uL (ref 3.70–5.32)
RDW: 13.4 % (ref 11.3–14.5)
RETIC #: 31.7 10*3/uL (ref 19.7–115.1)
Retic %: 0.8 % (ref 0.4–2.3)
WBC: 6.9 10*3/uL (ref 3.9–10.0)
lymph#: 0.7 10*3/uL — ABNORMAL LOW (ref 0.9–3.3)

## 2006-12-25 LAB — CHCC SMEAR

## 2006-12-27 LAB — TRANSFERRIN RECEPTOR, SOLUABLE: Transferrin Receptor, Soluble: 8.2 nmol/L

## 2006-12-27 LAB — FERRITIN: Ferritin: 886 ng/mL — ABNORMAL HIGH (ref 10–291)

## 2007-01-06 ENCOUNTER — Encounter (INDEPENDENT_AMBULATORY_CARE_PROVIDER_SITE_OTHER): Payer: Self-pay | Admitting: Family Medicine

## 2007-01-08 ENCOUNTER — Encounter (INDEPENDENT_AMBULATORY_CARE_PROVIDER_SITE_OTHER): Payer: Self-pay | Admitting: *Deleted

## 2007-01-08 ENCOUNTER — Encounter: Admission: RE | Admit: 2007-01-08 | Discharge: 2007-01-08 | Payer: Self-pay | Admitting: General Surgery

## 2007-01-08 ENCOUNTER — Telehealth (INDEPENDENT_AMBULATORY_CARE_PROVIDER_SITE_OTHER): Payer: Self-pay | Admitting: *Deleted

## 2007-01-13 ENCOUNTER — Telehealth (INDEPENDENT_AMBULATORY_CARE_PROVIDER_SITE_OTHER): Payer: Self-pay | Admitting: *Deleted

## 2007-01-15 ENCOUNTER — Ambulatory Visit: Payer: Self-pay | Admitting: Family Medicine

## 2007-01-29 ENCOUNTER — Telehealth (INDEPENDENT_AMBULATORY_CARE_PROVIDER_SITE_OTHER): Payer: Self-pay | Admitting: *Deleted

## 2007-02-10 ENCOUNTER — Telehealth (INDEPENDENT_AMBULATORY_CARE_PROVIDER_SITE_OTHER): Payer: Self-pay | Admitting: *Deleted

## 2007-02-12 ENCOUNTER — Observation Stay (HOSPITAL_COMMUNITY): Admission: EM | Admit: 2007-02-12 | Discharge: 2007-02-14 | Payer: Self-pay | Admitting: Emergency Medicine

## 2007-02-12 ENCOUNTER — Ambulatory Visit: Payer: Self-pay | Admitting: Internal Medicine

## 2007-02-14 ENCOUNTER — Encounter (INDEPENDENT_AMBULATORY_CARE_PROVIDER_SITE_OTHER): Payer: Self-pay | Admitting: Family Medicine

## 2007-02-14 ENCOUNTER — Telehealth (INDEPENDENT_AMBULATORY_CARE_PROVIDER_SITE_OTHER): Payer: Self-pay | Admitting: Family Medicine

## 2007-02-14 ENCOUNTER — Telehealth (INDEPENDENT_AMBULATORY_CARE_PROVIDER_SITE_OTHER): Payer: Self-pay | Admitting: *Deleted

## 2007-02-17 ENCOUNTER — Ambulatory Visit: Payer: Self-pay | Admitting: Family Medicine

## 2007-02-17 DIAGNOSIS — R55 Syncope and collapse: Secondary | ICD-10-CM | POA: Insufficient documentation

## 2007-02-17 DIAGNOSIS — M810 Age-related osteoporosis without current pathological fracture: Secondary | ICD-10-CM | POA: Insufficient documentation

## 2007-02-20 ENCOUNTER — Telehealth (INDEPENDENT_AMBULATORY_CARE_PROVIDER_SITE_OTHER): Payer: Self-pay | Admitting: *Deleted

## 2007-02-24 ENCOUNTER — Ambulatory Visit: Payer: Self-pay | Admitting: Hematology & Oncology

## 2007-02-26 ENCOUNTER — Encounter (INDEPENDENT_AMBULATORY_CARE_PROVIDER_SITE_OTHER): Payer: Self-pay | Admitting: Family Medicine

## 2007-02-26 LAB — CBC & DIFF AND RETIC
BASO%: 0.4 % (ref 0.0–2.0)
Basophils Absolute: 0 10*3/uL (ref 0.0–0.1)
EOS%: 1.2 % (ref 0.0–7.0)
Eosinophils Absolute: 0.1 10*3/uL (ref 0.0–0.5)
HCT: 34.4 % — ABNORMAL LOW (ref 34.8–46.6)
HGB: 11.8 g/dL (ref 11.6–15.9)
IRF: 0.25 (ref 0.130–0.330)
LYMPH%: 13.5 % — ABNORMAL LOW (ref 14.0–48.0)
MCH: 30.3 pg (ref 26.0–34.0)
MCHC: 34.3 g/dL (ref 32.0–36.0)
MCV: 88.1 fL (ref 81.0–101.0)
MONO#: 0.5 10*3/uL (ref 0.1–0.9)
MONO%: 7.9 % (ref 0.0–13.0)
NEUT#: 4.9 10*3/uL (ref 1.5–6.5)
NEUT%: 77 % — ABNORMAL HIGH (ref 39.6–76.8)
Platelets: 373 10*3/uL (ref 145–400)
RBC: 3.9 10*6/uL (ref 3.70–5.32)
RDW: 13 % (ref 11.3–14.5)
RETIC #: 29.3 10*3/uL (ref 19.7–115.1)
Retic %: 0.8 % (ref 0.4–2.3)
WBC: 6.4 10*3/uL (ref 3.9–10.0)
lymph#: 0.9 10*3/uL (ref 0.9–3.3)

## 2007-02-26 LAB — CHCC SMEAR

## 2007-02-26 LAB — FERRITIN: Ferritin: 868 ng/mL — ABNORMAL HIGH (ref 10–291)

## 2007-03-03 ENCOUNTER — Ambulatory Visit: Payer: Self-pay | Admitting: Family Medicine

## 2007-03-10 ENCOUNTER — Telehealth (INDEPENDENT_AMBULATORY_CARE_PROVIDER_SITE_OTHER): Payer: Self-pay | Admitting: *Deleted

## 2007-03-18 ENCOUNTER — Encounter (INDEPENDENT_AMBULATORY_CARE_PROVIDER_SITE_OTHER): Payer: Self-pay | Admitting: Family Medicine

## 2007-03-18 ENCOUNTER — Ambulatory Visit: Payer: Self-pay | Admitting: Internal Medicine

## 2007-03-21 ENCOUNTER — Telehealth (INDEPENDENT_AMBULATORY_CARE_PROVIDER_SITE_OTHER): Payer: Self-pay | Admitting: *Deleted

## 2007-03-28 ENCOUNTER — Telehealth (INDEPENDENT_AMBULATORY_CARE_PROVIDER_SITE_OTHER): Payer: Self-pay | Admitting: *Deleted

## 2007-04-09 ENCOUNTER — Telehealth (INDEPENDENT_AMBULATORY_CARE_PROVIDER_SITE_OTHER): Payer: Self-pay | Admitting: *Deleted

## 2007-04-18 ENCOUNTER — Telehealth (INDEPENDENT_AMBULATORY_CARE_PROVIDER_SITE_OTHER): Payer: Self-pay | Admitting: *Deleted

## 2007-04-28 ENCOUNTER — Ambulatory Visit: Payer: Self-pay | Admitting: Hematology & Oncology

## 2007-04-28 ENCOUNTER — Telehealth (INDEPENDENT_AMBULATORY_CARE_PROVIDER_SITE_OTHER): Payer: Self-pay | Admitting: *Deleted

## 2007-05-01 ENCOUNTER — Encounter (INDEPENDENT_AMBULATORY_CARE_PROVIDER_SITE_OTHER): Payer: Self-pay | Admitting: Family Medicine

## 2007-05-01 LAB — CBC & DIFF AND RETIC
BASO%: 0.5 % (ref 0.0–2.0)
Basophils Absolute: 0 10*3/uL (ref 0.0–0.1)
EOS%: 0.8 % (ref 0.0–7.0)
Eosinophils Absolute: 0.1 10*3/uL (ref 0.0–0.5)
HCT: 37.7 % (ref 34.8–46.6)
HGB: 11.8 g/dL (ref 11.6–15.9)
IRF: 0.23 (ref 0.130–0.330)
LYMPH%: 7 % — ABNORMAL LOW (ref 14.0–48.0)
MCH: 27.5 pg (ref 26.0–34.0)
MCHC: 31.4 g/dL — ABNORMAL LOW (ref 32.0–36.0)
MCV: 87.8 fL (ref 81.0–101.0)
MONO#: 0.5 10*3/uL (ref 0.1–0.9)
MONO%: 5.4 % (ref 0.0–13.0)
NEUT#: 7.9 10*3/uL — ABNORMAL HIGH (ref 1.5–6.5)
NEUT%: 86.3 % — ABNORMAL HIGH (ref 39.6–76.8)
Platelets: 264 10*3/uL (ref 145–400)
RBC: 4.29 10*6/uL (ref 3.70–5.32)
RDW: 13.7 % (ref 11.3–14.5)
RETIC #: 24.9 10*3/uL (ref 19.7–115.1)
Retic %: 0.6 % (ref 0.4–2.3)
WBC: 9.1 10*3/uL (ref 3.9–10.0)
lymph#: 0.6 10*3/uL — ABNORMAL LOW (ref 0.9–3.3)

## 2007-05-01 LAB — CHCC SMEAR

## 2007-05-03 LAB — TRANSFERRIN RECEPTOR, SOLUABLE: Transferrin Receptor, Soluble: 8.6 nmol/L

## 2007-05-03 LAB — FERRITIN: Ferritin: 774 ng/mL — ABNORMAL HIGH (ref 10–291)

## 2007-05-14 ENCOUNTER — Telehealth (INDEPENDENT_AMBULATORY_CARE_PROVIDER_SITE_OTHER): Payer: Self-pay | Admitting: *Deleted

## 2007-06-02 ENCOUNTER — Ambulatory Visit: Payer: Self-pay | Admitting: Family Medicine

## 2007-06-12 ENCOUNTER — Telehealth (INDEPENDENT_AMBULATORY_CARE_PROVIDER_SITE_OTHER): Payer: Self-pay | Admitting: *Deleted

## 2007-06-26 ENCOUNTER — Telehealth (INDEPENDENT_AMBULATORY_CARE_PROVIDER_SITE_OTHER): Payer: Self-pay | Admitting: *Deleted

## 2007-07-14 ENCOUNTER — Telehealth: Payer: Self-pay | Admitting: Internal Medicine

## 2007-07-16 ENCOUNTER — Telehealth: Payer: Self-pay | Admitting: Internal Medicine

## 2007-07-17 ENCOUNTER — Encounter: Payer: Self-pay | Admitting: Internal Medicine

## 2007-07-17 ENCOUNTER — Telehealth (INDEPENDENT_AMBULATORY_CARE_PROVIDER_SITE_OTHER): Payer: Self-pay | Admitting: *Deleted

## 2007-07-29 ENCOUNTER — Ambulatory Visit: Payer: Self-pay | Admitting: Hematology & Oncology

## 2007-07-31 ENCOUNTER — Encounter: Payer: Self-pay | Admitting: Internal Medicine

## 2007-08-07 ENCOUNTER — Telehealth: Payer: Self-pay | Admitting: Internal Medicine

## 2007-08-07 ENCOUNTER — Ambulatory Visit: Payer: Self-pay | Admitting: Critical Care Medicine

## 2007-08-07 ENCOUNTER — Inpatient Hospital Stay (HOSPITAL_COMMUNITY): Admission: EM | Admit: 2007-08-07 | Discharge: 2007-08-19 | Payer: Self-pay | Admitting: Emergency Medicine

## 2007-08-07 ENCOUNTER — Ambulatory Visit: Payer: Self-pay | Admitting: Internal Medicine

## 2007-08-07 ENCOUNTER — Encounter (INDEPENDENT_AMBULATORY_CARE_PROVIDER_SITE_OTHER): Payer: Self-pay | Admitting: *Deleted

## 2007-08-07 DIAGNOSIS — R109 Unspecified abdominal pain: Secondary | ICD-10-CM | POA: Insufficient documentation

## 2007-08-07 DIAGNOSIS — R110 Nausea: Secondary | ICD-10-CM | POA: Insufficient documentation

## 2007-08-07 DIAGNOSIS — K3184 Gastroparesis: Secondary | ICD-10-CM | POA: Insufficient documentation

## 2007-08-13 ENCOUNTER — Encounter (INDEPENDENT_AMBULATORY_CARE_PROVIDER_SITE_OTHER): Payer: Self-pay | Admitting: Interventional Radiology

## 2007-08-15 ENCOUNTER — Encounter (INDEPENDENT_AMBULATORY_CARE_PROVIDER_SITE_OTHER): Payer: Self-pay | Admitting: *Deleted

## 2007-08-21 ENCOUNTER — Telehealth (INDEPENDENT_AMBULATORY_CARE_PROVIDER_SITE_OTHER): Payer: Self-pay | Admitting: *Deleted

## 2007-08-22 ENCOUNTER — Encounter: Payer: Self-pay | Admitting: Family Medicine

## 2007-08-25 ENCOUNTER — Encounter: Payer: Self-pay | Admitting: Family Medicine

## 2007-08-25 ENCOUNTER — Telehealth (INDEPENDENT_AMBULATORY_CARE_PROVIDER_SITE_OTHER): Payer: Self-pay | Admitting: *Deleted

## 2007-08-26 ENCOUNTER — Ambulatory Visit: Payer: Self-pay | Admitting: Family Medicine

## 2007-08-27 ENCOUNTER — Encounter: Payer: Self-pay | Admitting: Family Medicine

## 2007-08-28 ENCOUNTER — Telehealth: Payer: Self-pay | Admitting: Family Medicine

## 2007-08-28 LAB — CONVERTED CEMR LAB
BUN: 12 mg/dL (ref 6–23)
Basophils Absolute: 0 10*3/uL (ref 0.0–0.1)
Basophils Relative: 0.3 % (ref 0.0–1.0)
CO2: 29 meq/L (ref 19–32)
Calcium: 8.2 mg/dL — ABNORMAL LOW (ref 8.4–10.5)
Chloride: 98 meq/L (ref 96–112)
Creatinine, Ser: 0.9 mg/dL (ref 0.4–1.2)
Eosinophils Absolute: 0.2 10*3/uL (ref 0.0–0.7)
Eosinophils Relative: 2.9 % (ref 0.0–5.0)
GFR calc Af Amer: 82 mL/min
GFR calc non Af Amer: 67 mL/min
Glucose, Bld: 74 mg/dL (ref 70–99)
HCT: 29.6 % — ABNORMAL LOW (ref 36.0–46.0)
Hemoglobin: 10 g/dL — ABNORMAL LOW (ref 12.0–15.0)
Lymphocytes Relative: 15.4 % (ref 12.0–46.0)
MCHC: 33.7 g/dL (ref 30.0–36.0)
MCV: 86.9 fL (ref 78.0–100.0)
Monocytes Absolute: 0.4 10*3/uL (ref 0.1–1.0)
Monocytes Relative: 7.6 % (ref 3.0–12.0)
Neutro Abs: 3.9 10*3/uL (ref 1.4–7.7)
Neutrophils Relative %: 73.8 % (ref 43.0–77.0)
Platelets: 558 10*3/uL — ABNORMAL HIGH (ref 150–400)
Potassium: 4.9 meq/L (ref 3.5–5.1)
RBC: 3.4 M/uL — ABNORMAL LOW (ref 3.87–5.11)
RDW: 12.5 % (ref 11.5–14.6)
Sodium: 134 meq/L — ABNORMAL LOW (ref 135–145)
WBC: 5.3 10*3/uL (ref 4.5–10.5)

## 2007-09-01 ENCOUNTER — Encounter (INDEPENDENT_AMBULATORY_CARE_PROVIDER_SITE_OTHER): Payer: Self-pay | Admitting: *Deleted

## 2007-09-03 ENCOUNTER — Ambulatory Visit: Payer: Self-pay | Admitting: Family Medicine

## 2007-09-03 ENCOUNTER — Encounter (INDEPENDENT_AMBULATORY_CARE_PROVIDER_SITE_OTHER): Payer: Self-pay | Admitting: *Deleted

## 2007-09-08 ENCOUNTER — Ambulatory Visit: Payer: Self-pay | Admitting: Family Medicine

## 2007-09-24 ENCOUNTER — Telehealth (INDEPENDENT_AMBULATORY_CARE_PROVIDER_SITE_OTHER): Payer: Self-pay | Admitting: *Deleted

## 2007-09-26 ENCOUNTER — Telehealth (INDEPENDENT_AMBULATORY_CARE_PROVIDER_SITE_OTHER): Payer: Self-pay | Admitting: *Deleted

## 2007-10-02 ENCOUNTER — Telehealth (INDEPENDENT_AMBULATORY_CARE_PROVIDER_SITE_OTHER): Payer: Self-pay | Admitting: *Deleted

## 2007-10-03 ENCOUNTER — Telehealth (INDEPENDENT_AMBULATORY_CARE_PROVIDER_SITE_OTHER): Payer: Self-pay | Admitting: *Deleted

## 2007-10-09 ENCOUNTER — Ambulatory Visit: Payer: Self-pay | Admitting: Internal Medicine

## 2007-10-09 LAB — CONVERTED CEMR LAB
Basophils Absolute: 0.1 10*3/uL (ref 0.0–0.1)
Basophils Relative: 0.8 % (ref 0.0–3.0)
Eosinophils Absolute: 0.1 10*3/uL (ref 0.0–0.7)
Eosinophils Relative: 1.6 % (ref 0.0–5.0)
HCT: 35.6 % — ABNORMAL LOW (ref 36.0–46.0)
Hemoglobin: 11.9 g/dL — ABNORMAL LOW (ref 12.0–15.0)
Iron: 49 ug/dL (ref 42–145)
Lymphocytes Relative: 11.1 % — ABNORMAL LOW (ref 12.0–46.0)
MCHC: 33.5 g/dL (ref 30.0–36.0)
MCV: 85.8 fL (ref 78.0–100.0)
Monocytes Absolute: 0.4 10*3/uL (ref 0.1–1.0)
Monocytes Relative: 6 % (ref 3.0–12.0)
Neutro Abs: 5 10*3/uL (ref 1.4–7.7)
Neutrophils Relative %: 80.5 % — ABNORMAL HIGH (ref 43.0–77.0)
Platelets: 285 10*3/uL (ref 150–400)
RBC: 4.14 M/uL (ref 3.87–5.11)
RDW: 13.2 % (ref 11.5–14.6)
Saturation Ratios: 19.3 % — ABNORMAL LOW (ref 20.0–50.0)
Transferrin: 181.6 mg/dL — ABNORMAL LOW (ref >=212.0)
WBC: 6.3 10*3/uL (ref 4.5–10.5)

## 2007-10-13 ENCOUNTER — Encounter: Payer: Self-pay | Admitting: Family Medicine

## 2007-10-23 ENCOUNTER — Ambulatory Visit (HOSPITAL_BASED_OUTPATIENT_CLINIC_OR_DEPARTMENT_OTHER): Admission: RE | Admit: 2007-10-23 | Discharge: 2007-10-23 | Payer: Self-pay | Admitting: Urology

## 2007-10-23 HISTORY — PX: INTERSTIM IMPLANT REVISION: SHX5138

## 2007-10-24 ENCOUNTER — Telehealth (INDEPENDENT_AMBULATORY_CARE_PROVIDER_SITE_OTHER): Payer: Self-pay | Admitting: *Deleted

## 2007-10-28 ENCOUNTER — Ambulatory Visit: Payer: Self-pay | Admitting: Hematology & Oncology

## 2007-10-30 ENCOUNTER — Encounter: Payer: Self-pay | Admitting: Internal Medicine

## 2007-11-06 IMAGING — RF DG BE W/ CM - WO/W KUB
13 series · 13 of 13 positions shown · non-contrast
Comparison: Upper GI, 01/01/06.

CLINICAL DATA: Patient is status post near total colectomy with history of small bowel obstructions.  
 BARIUM ENEMA:

[Series 1: run · 1 of 1 slices shown (1 of 11)]
[im 1/1]
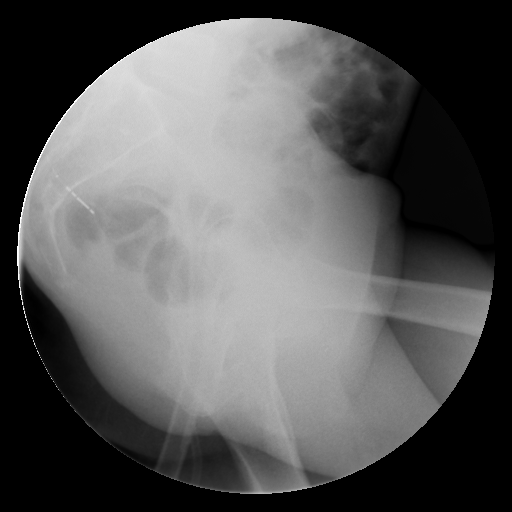

[Series 2: run · 1 of 1 slices shown (2 of 11)]
[im 1/1]
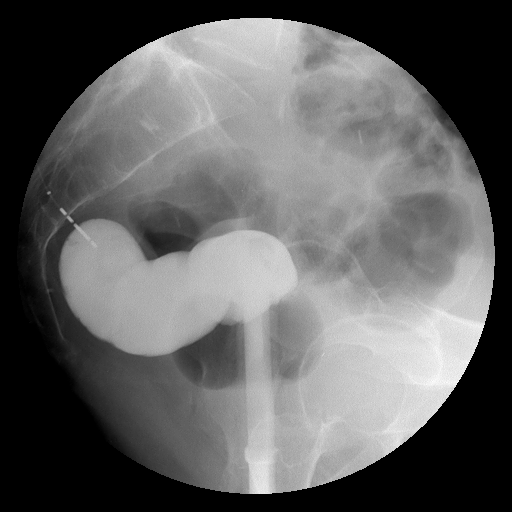

[Series 3: run · 1 of 1 slices shown (3 of 11)]
[im 1/1]
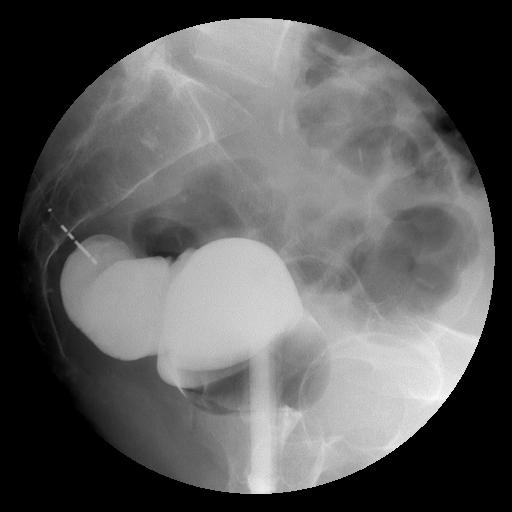

[Series 4: run · 1 of 1 slices shown (4 of 11)]
[im 1/1]
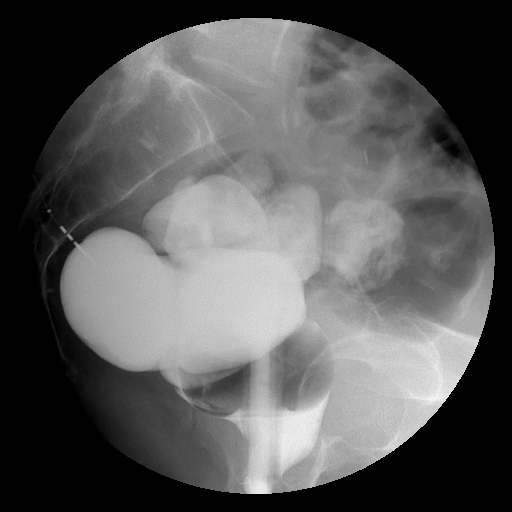

[Series 5: run · 1 of 1 slices shown (5 of 11)]
[im 1/1]
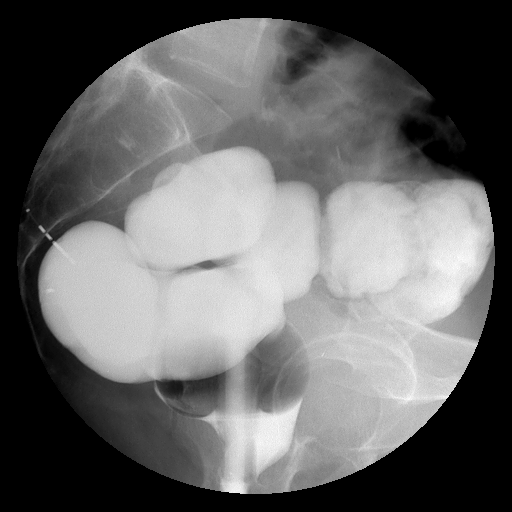

[Series 6: run · 1 of 1 slices shown (6 of 11)]
[im 1/1]
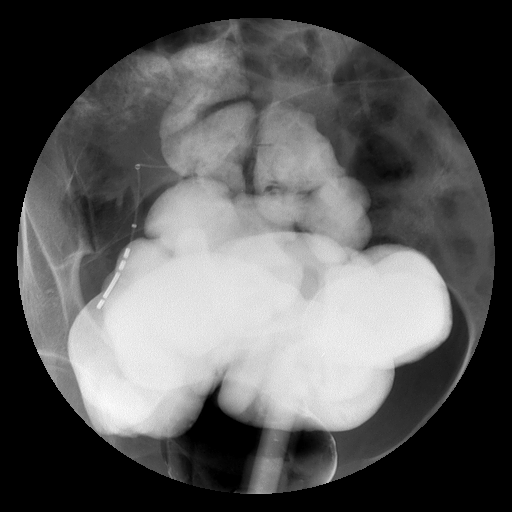

[Series 7: run · 1 of 1 slices shown (7 of 11)]
[im 1/1]
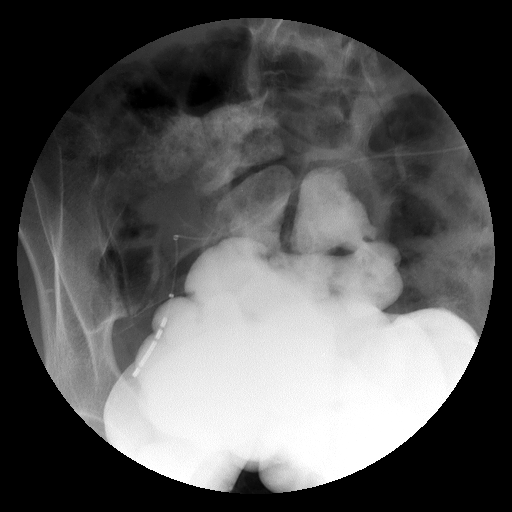

[Series 8: run · 1 of 1 slices shown (8 of 11)]
[im 1/1]
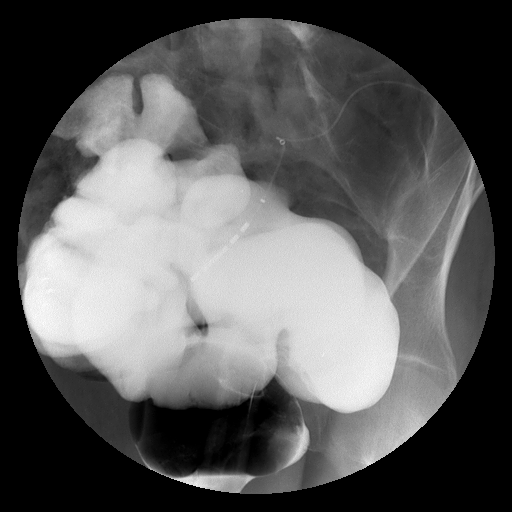

[Series 9: run · 1 of 1 slices shown (9 of 11)]
[im 1/1]
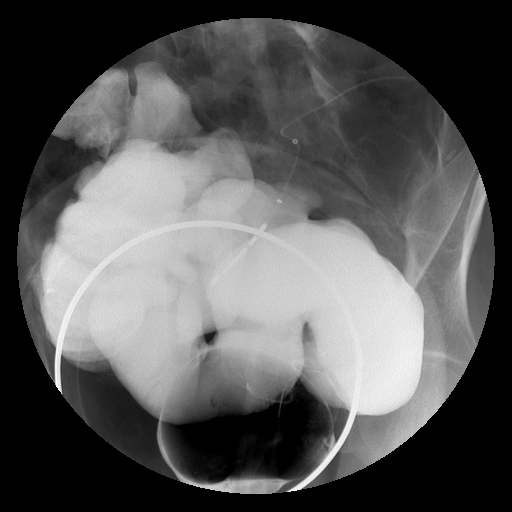

[Series 10: run · 1 of 1 slices shown (10 of 11)]
[im 1/1]
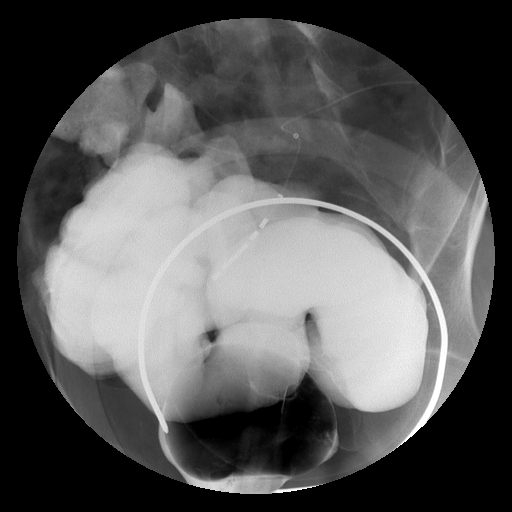

[Series 11: run · 1 of 1 slices shown (11 of 11)]
[im 1/1]
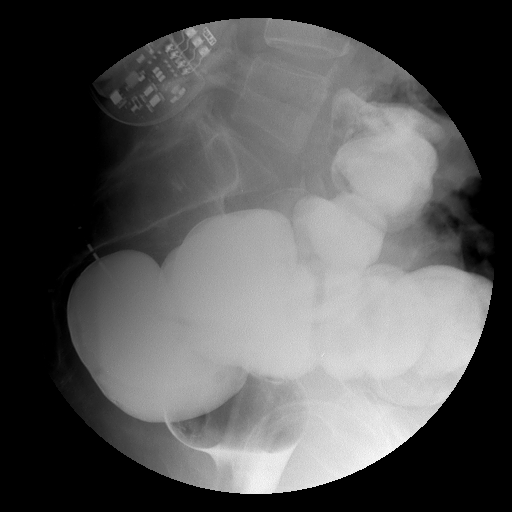

[Series 1001: view not recorded · 0.20mm/px · 1 of 1 slices shown (1 of 2)]
[im 1/1]
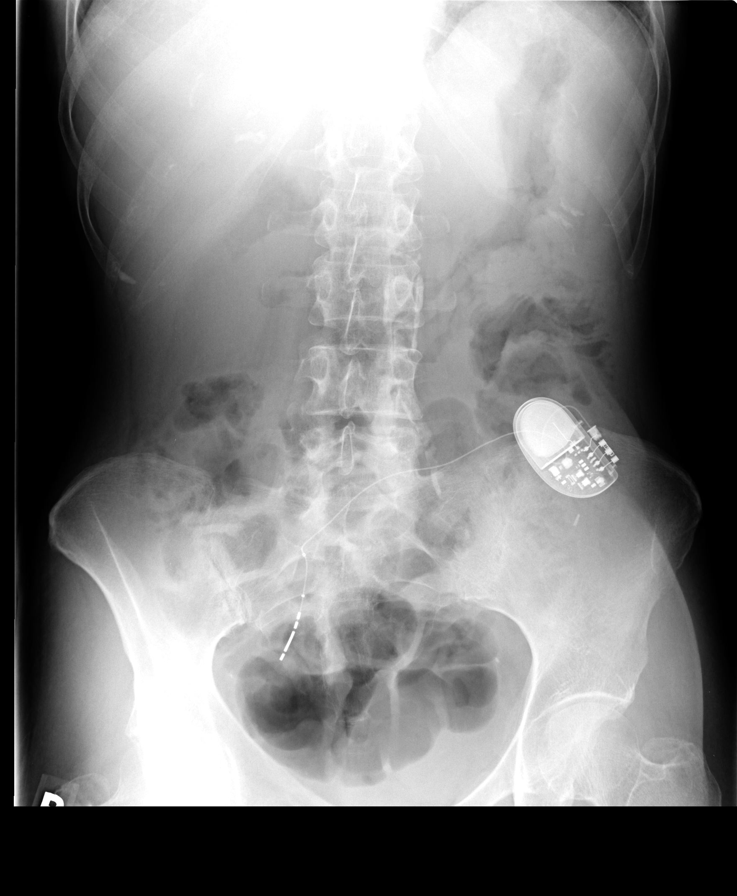

[Series 1002: view not recorded · 0.20mm/px · 1 of 1 slices shown (2 of 2)]
[im 1/1]
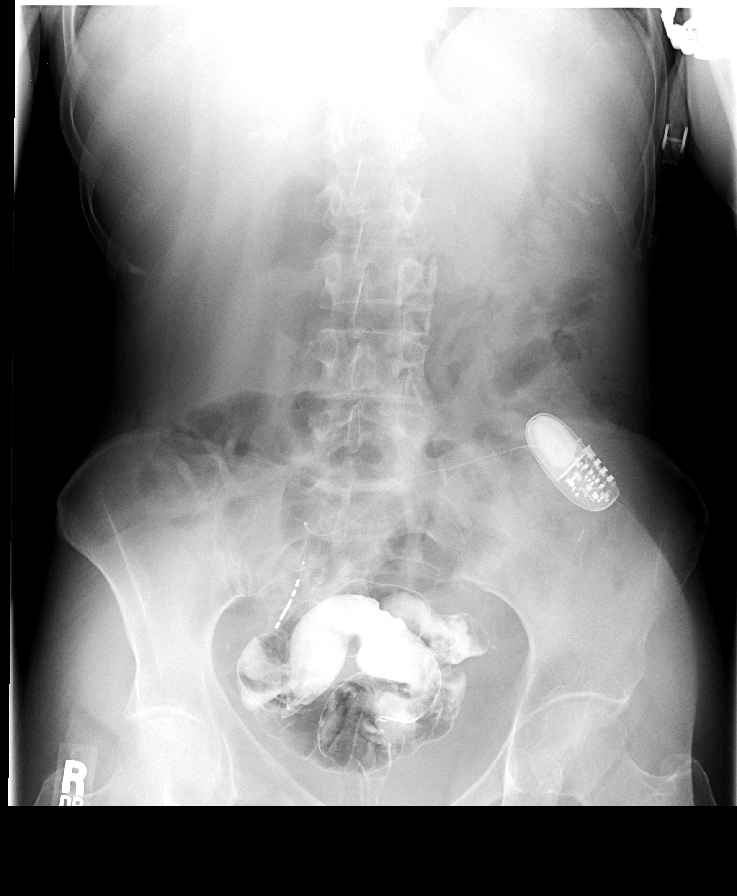

[13 of 13 positions shown; findings below may reference images not displayed]

FINDINGS: Under fluoroscopic guidance water soluble contrast was instilled in the rectum.  The rectum fills normally with contrast flowing freely through the distal enteric colonic anastomosis.  There is only a short segment of sigmoid colon distal to the anastomosis.  No evidence of mucosal irregularity, stricture, or mass within the sigmoid colon or rectum.
IMPRESSION: 1.  Near total colectomy. 
 2.  No evidence of obstruction or mass with contrast flowing freely through the enteric colonic anastomosis.

## 2007-11-20 ENCOUNTER — Telehealth (INDEPENDENT_AMBULATORY_CARE_PROVIDER_SITE_OTHER): Payer: Self-pay | Admitting: *Deleted

## 2007-11-27 ENCOUNTER — Telehealth (INDEPENDENT_AMBULATORY_CARE_PROVIDER_SITE_OTHER): Payer: Self-pay | Admitting: *Deleted

## 2007-12-08 ENCOUNTER — Ambulatory Visit: Payer: Self-pay | Admitting: Family Medicine

## 2007-12-08 DIAGNOSIS — I4891 Unspecified atrial fibrillation: Secondary | ICD-10-CM | POA: Insufficient documentation

## 2007-12-09 ENCOUNTER — Ambulatory Visit: Payer: Self-pay | Admitting: Family Medicine

## 2007-12-11 IMAGING — CR DG WRIST COMPLETE 3+V*L*
3 series · 3 of 3 positions shown · non-contrast
Comparison: none

HISTORY: Fall, left wrist pain

[view not recorded (1 of 3)]
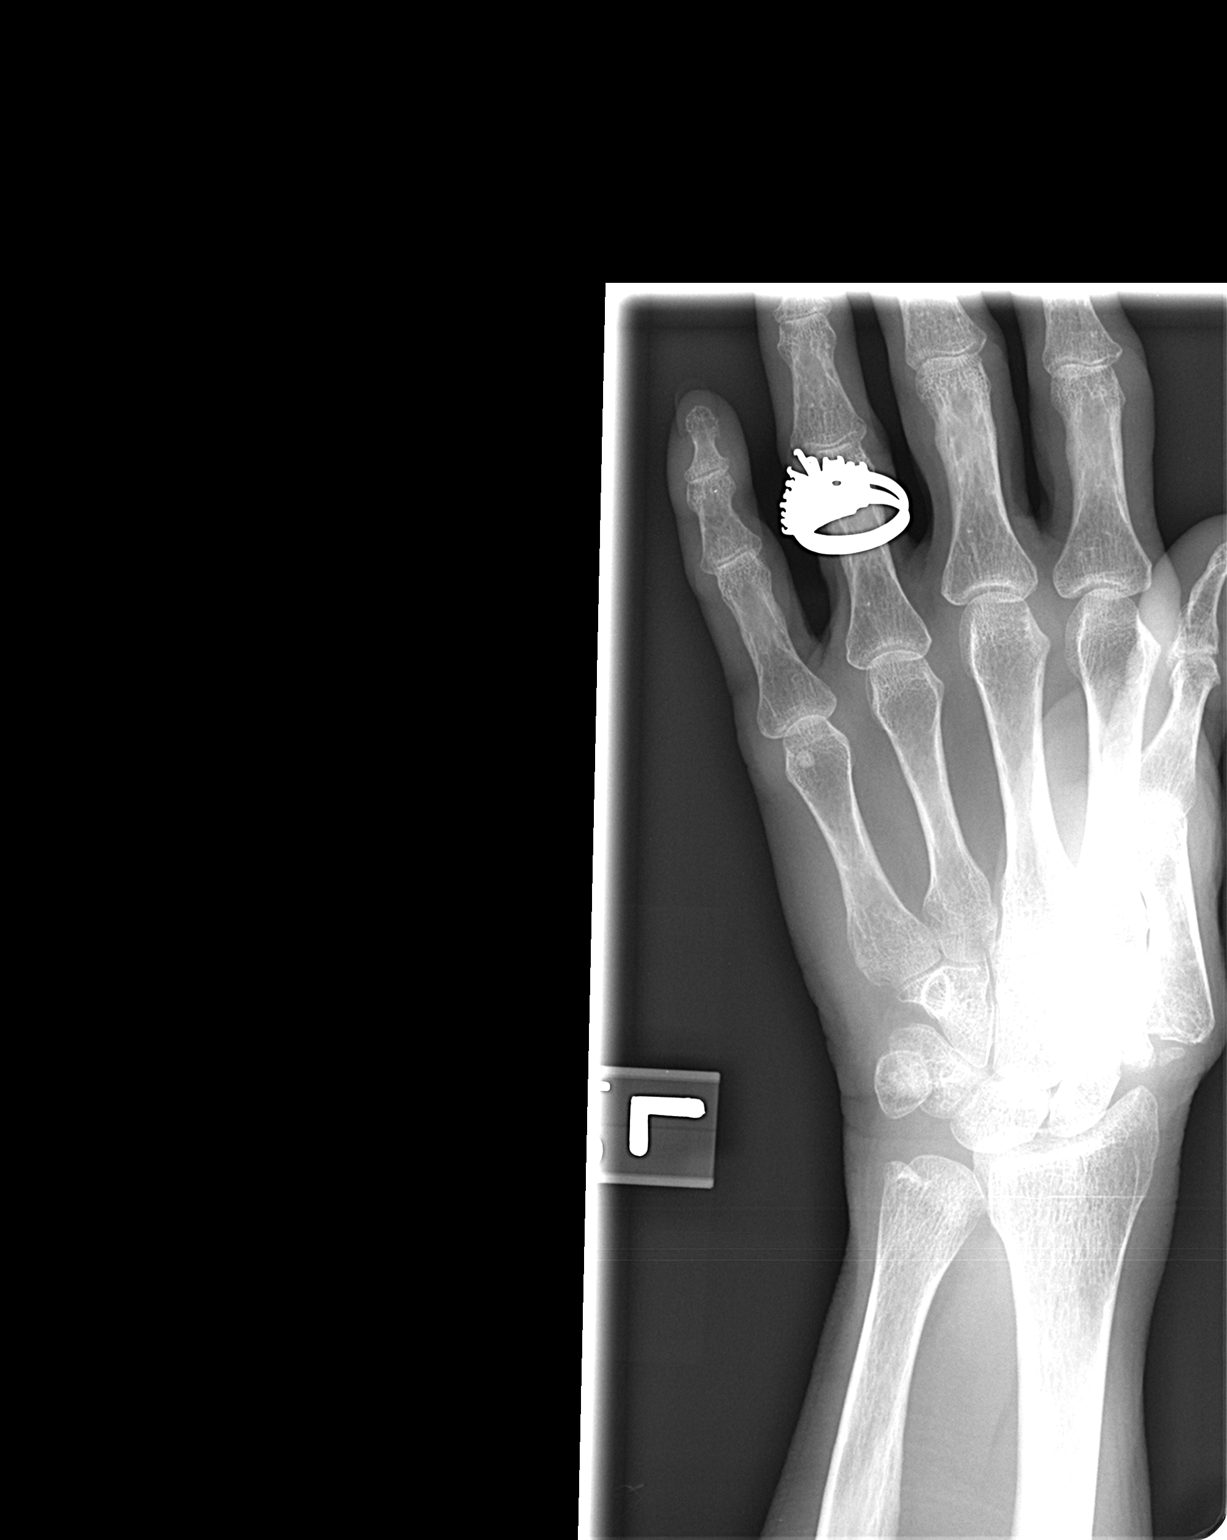

[view not recorded (2 of 3)]
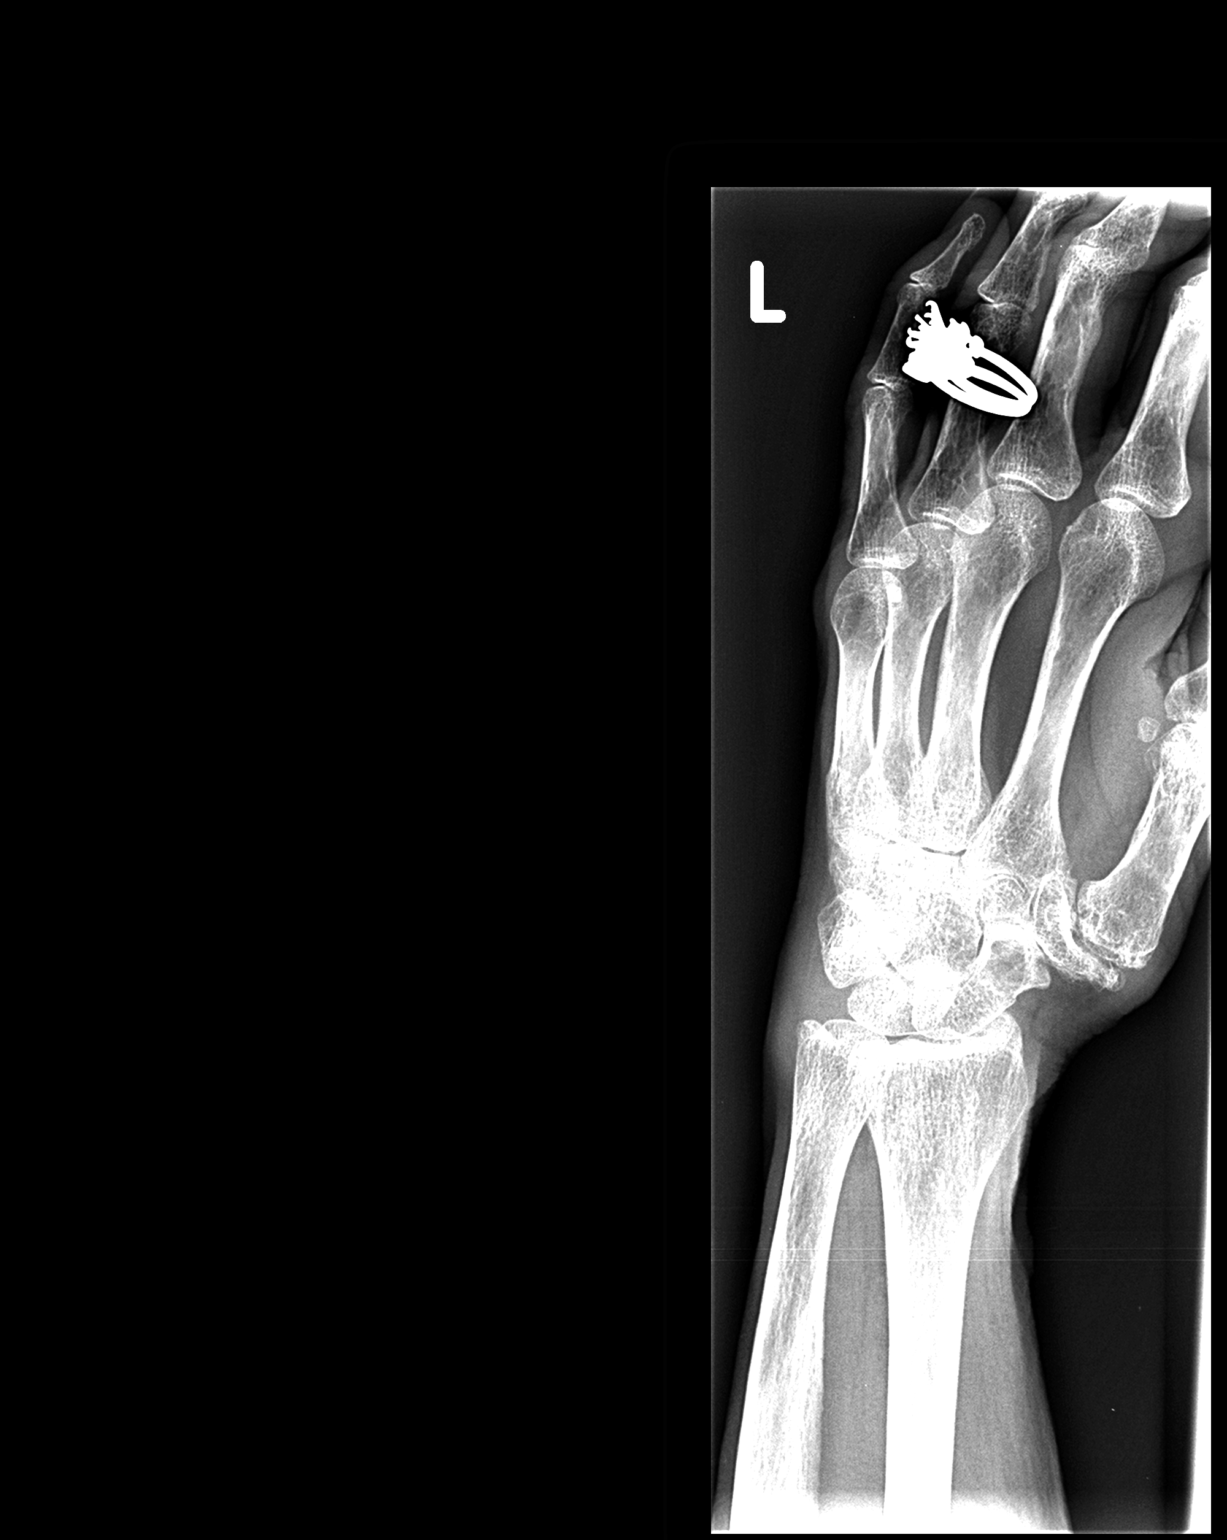

[view not recorded (3 of 3)]
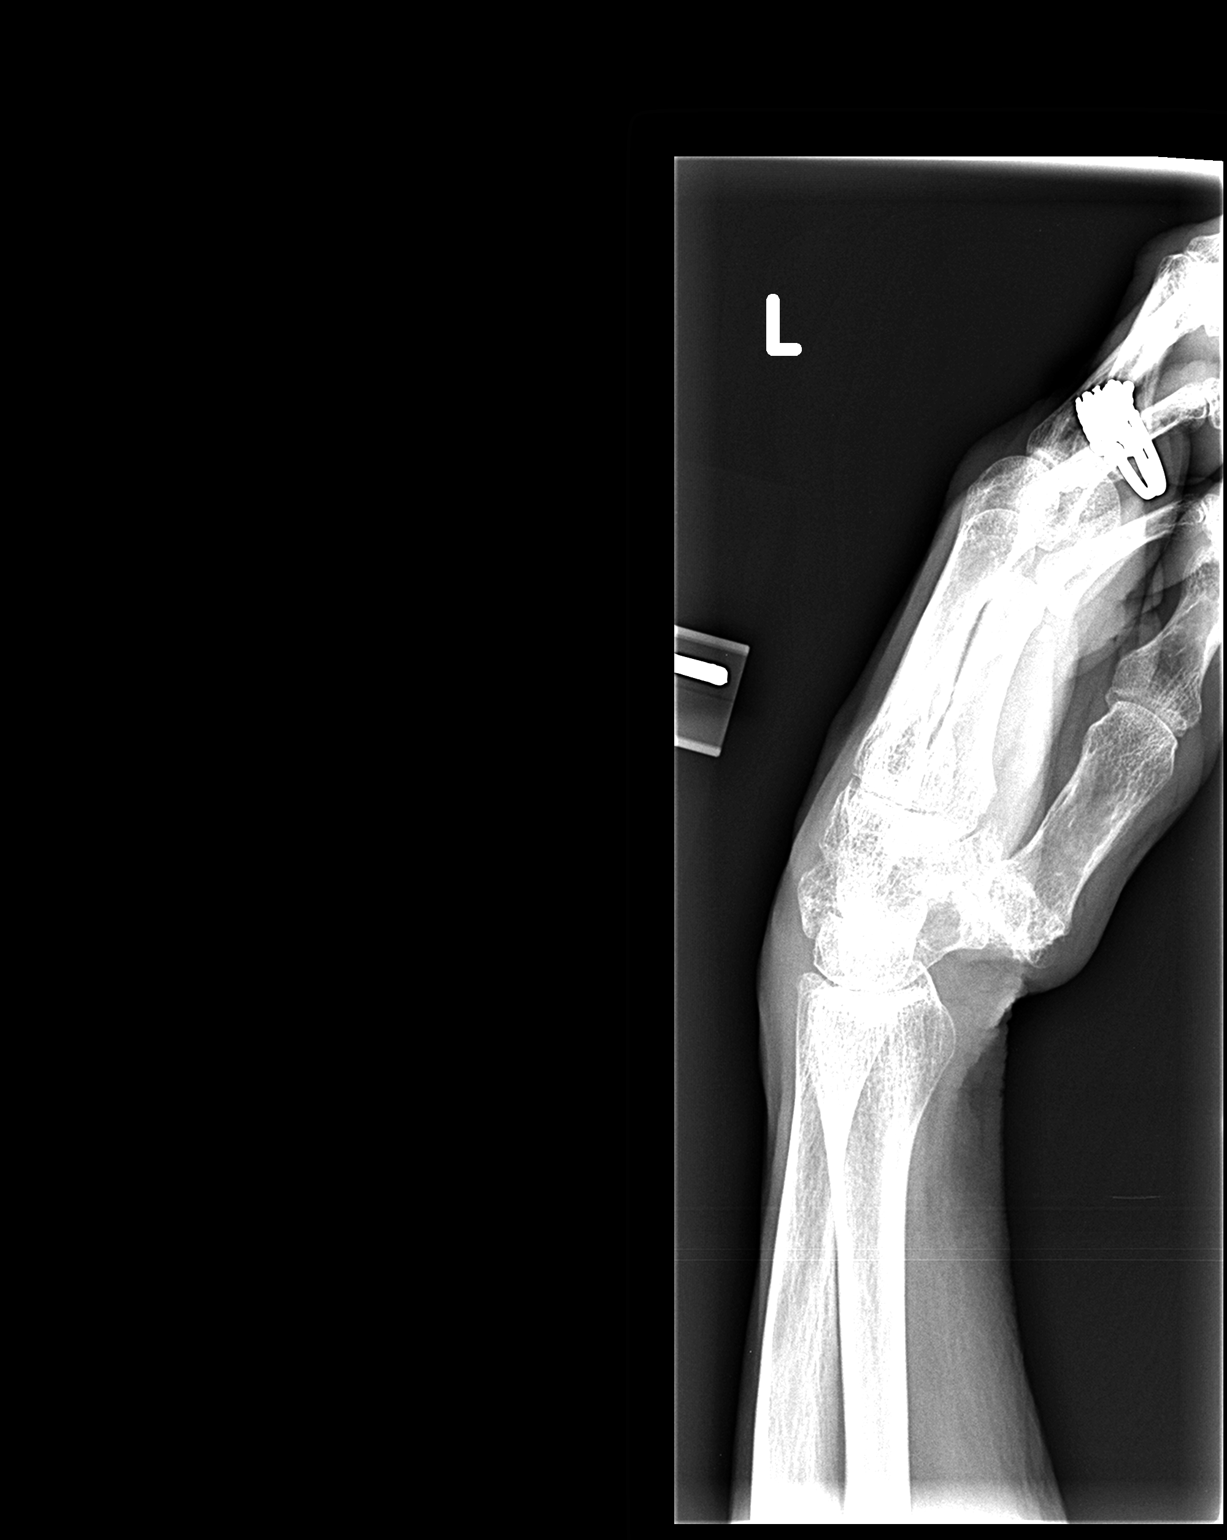

[3 of 3 positions shown; findings below may reference images not displayed]

LEFT WRIST 3 VIEWS:

Diffuse bony demineralization.
Degenerative changes at radial border of carpus, prominent at distal pole
scaphoid and at first carpometacarpal joint.
Cystic degenerative change seen at distal scaphoid.
No definite acute fracture or dislocation.
Slight obliquity on lateral view, limited by patient positioning.
Soft tissue swelling at ulnar border of wrist.
IMPRESSION: Osteoporosis with scattered degenerative changes at radial border of carpus.
No definite acute bony abnormalities.

## 2007-12-11 IMAGING — CT CT HEAD W/O CM
1 series · 16 of 30 positions shown, 20 images · IV contrast (agent unspecified)
Comparison: none

HISTORY: Syncope, fall

CT HEAD WITHOUT CONTRAST:
Routine noncontrast CT head without priors for comparison.
Minimal age related atrophy.
Normal ventricular morphology.
No midline shift or mass-effect.
Otherwise normal appearance of brain parenchyma.
No mass, hemorrhage, or infarct.
Sinuses clear and bones unremarkable.

[Series 2: headseq 4.8 h45s · axial · 0.43mm/px · z∈[-672,-544]mm · 16 of 30 slices shown, 20 images]
[im 2/30  brain]
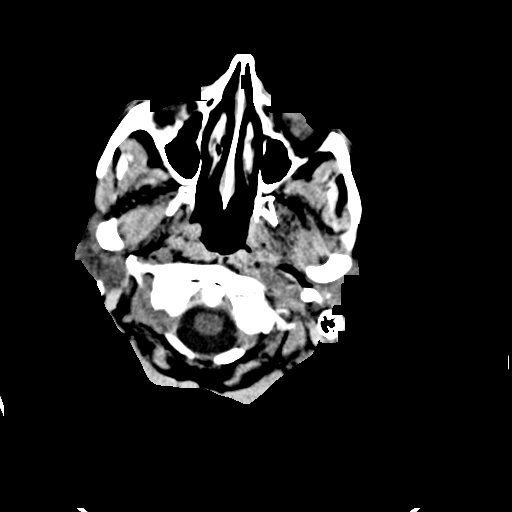
[im 2/30  bone]
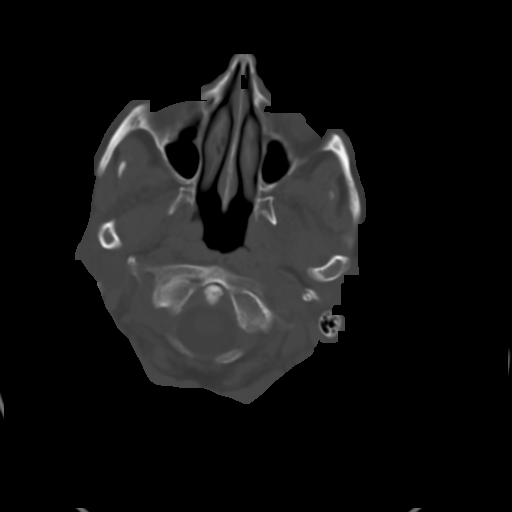
[im 4/30  brain]
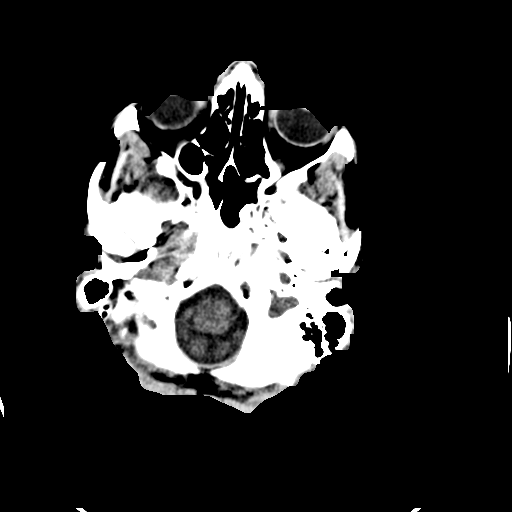
[im 6/30  brain]
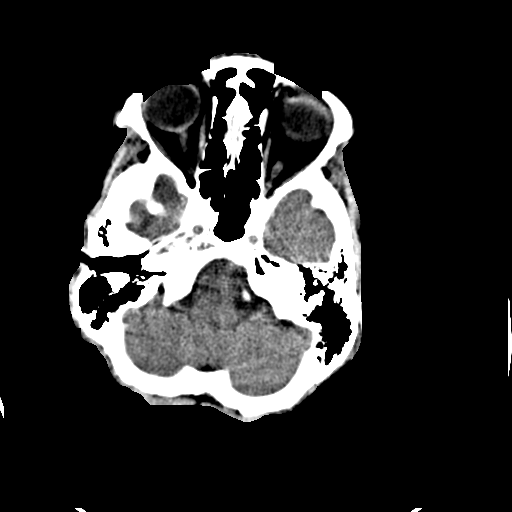
[im 8/30  brain]
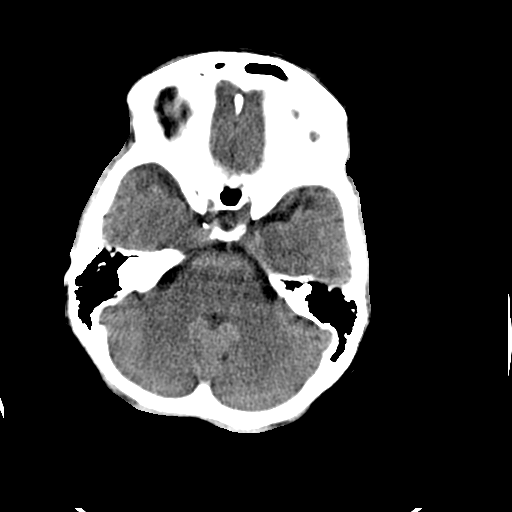
[im 9/30  brain]
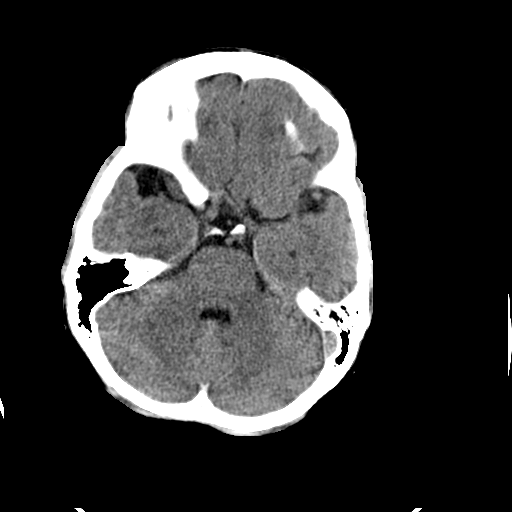
[im 9/30  bone]
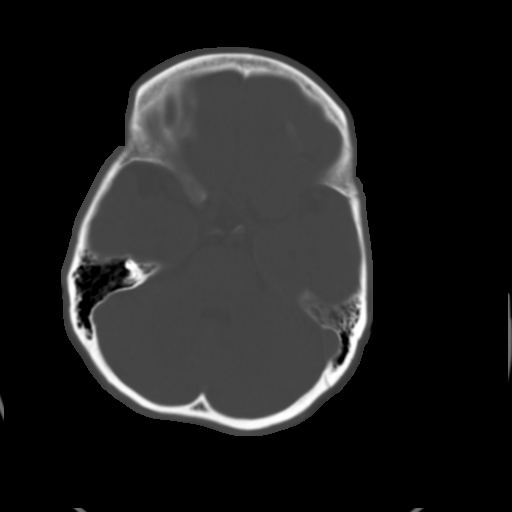
[im 11/30  brain]
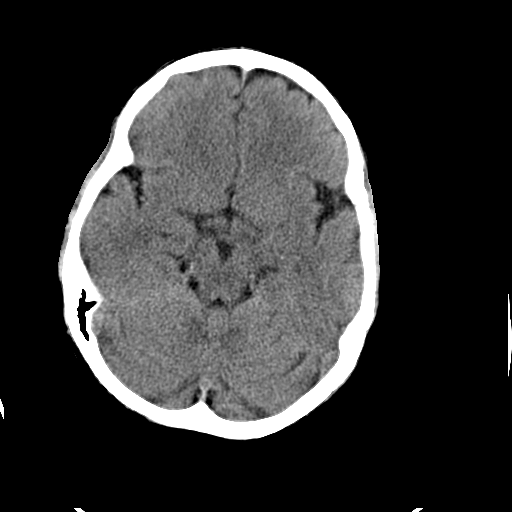
[im 13/30  brain]
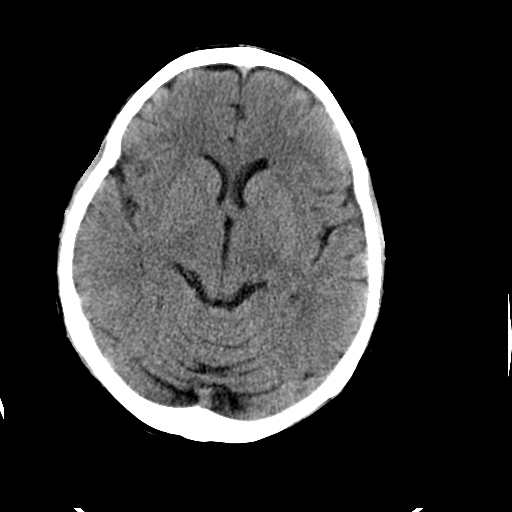
[im 15/30  brain]
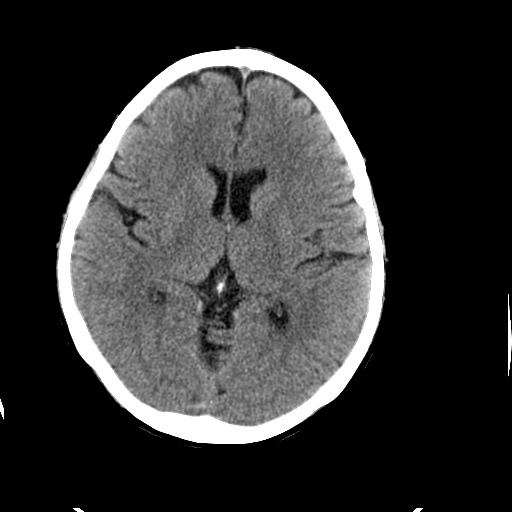
[im 16/30  brain]
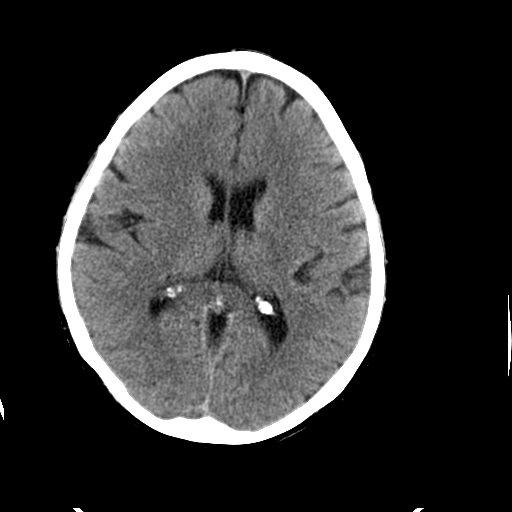
[im 16/30  bone]
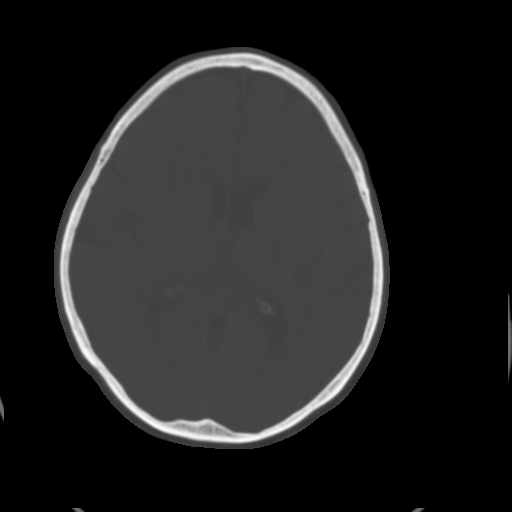
[im 18/30  brain]
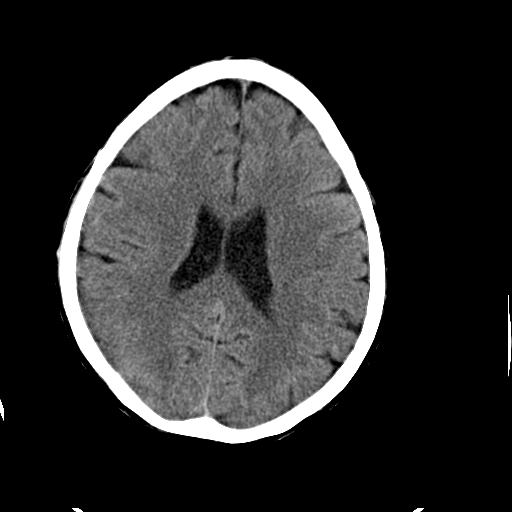
[im 20/30  brain]
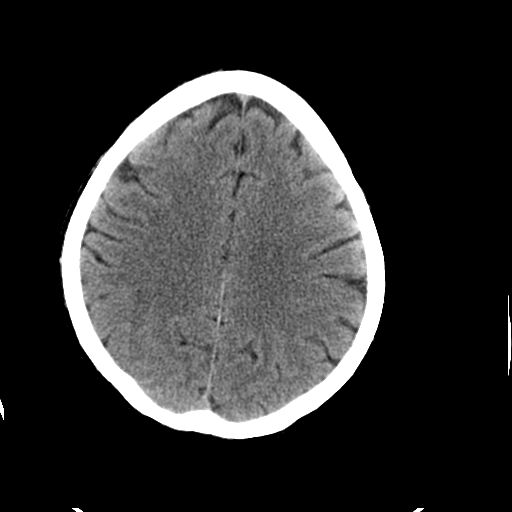
[im 22/30  brain]
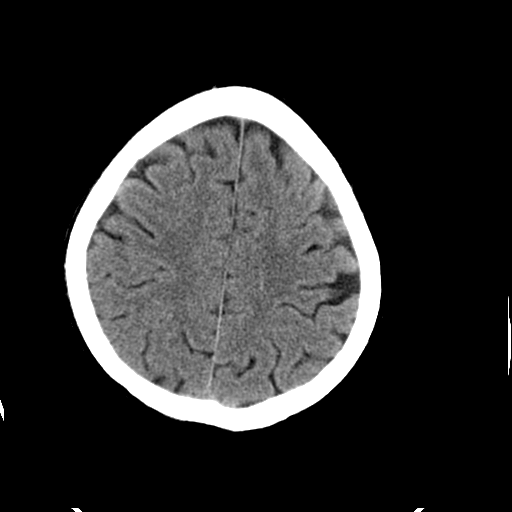
[im 23/30  brain]
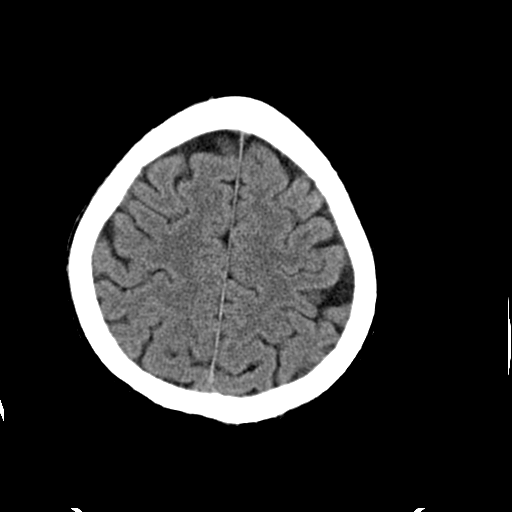
[im 23/30  bone]
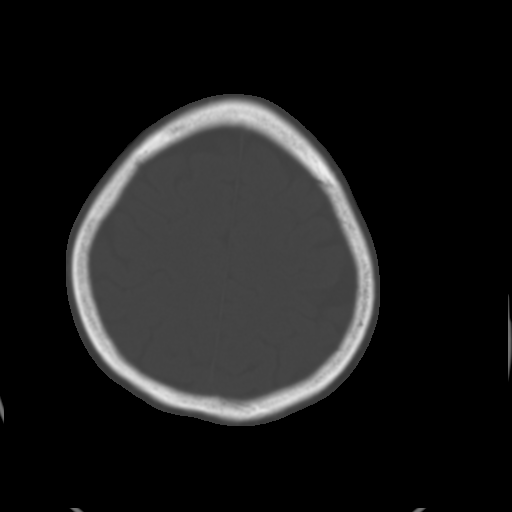
[im 25/30  brain]
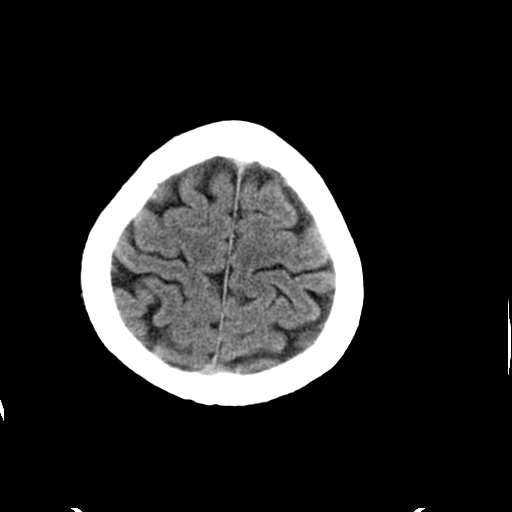
[im 27/30  brain]
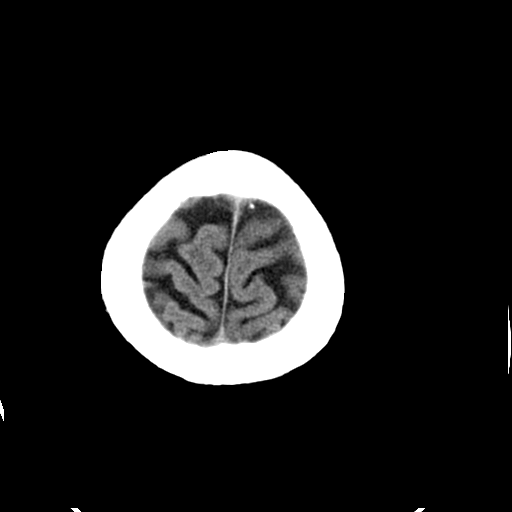
[im 29/30  brain]
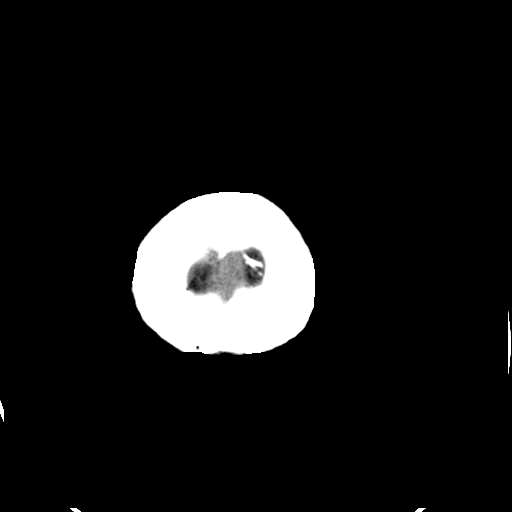

[16 of 30 positions shown; findings below may reference images not displayed]

IMPRESSION: No acute intracranial abnormalities.

## 2007-12-11 IMAGING — CR DG SACRUM/COCCYX 2+V
3 series · 3 of 3 positions shown · non-contrast
Comparison: none

HISTORY: Coccygeal pain, fall

SACRUM AND COCCYX 3 VIEWS:
Limited assessment of sacrum and coccyx on AP views due to stool in colon.
SI joints symmetric.
Diffuse bony demineralization.
Stimulator lead in pelvis.
No definite sacrococcygeal fracture on lateral view.

[t sacrum a.p.]
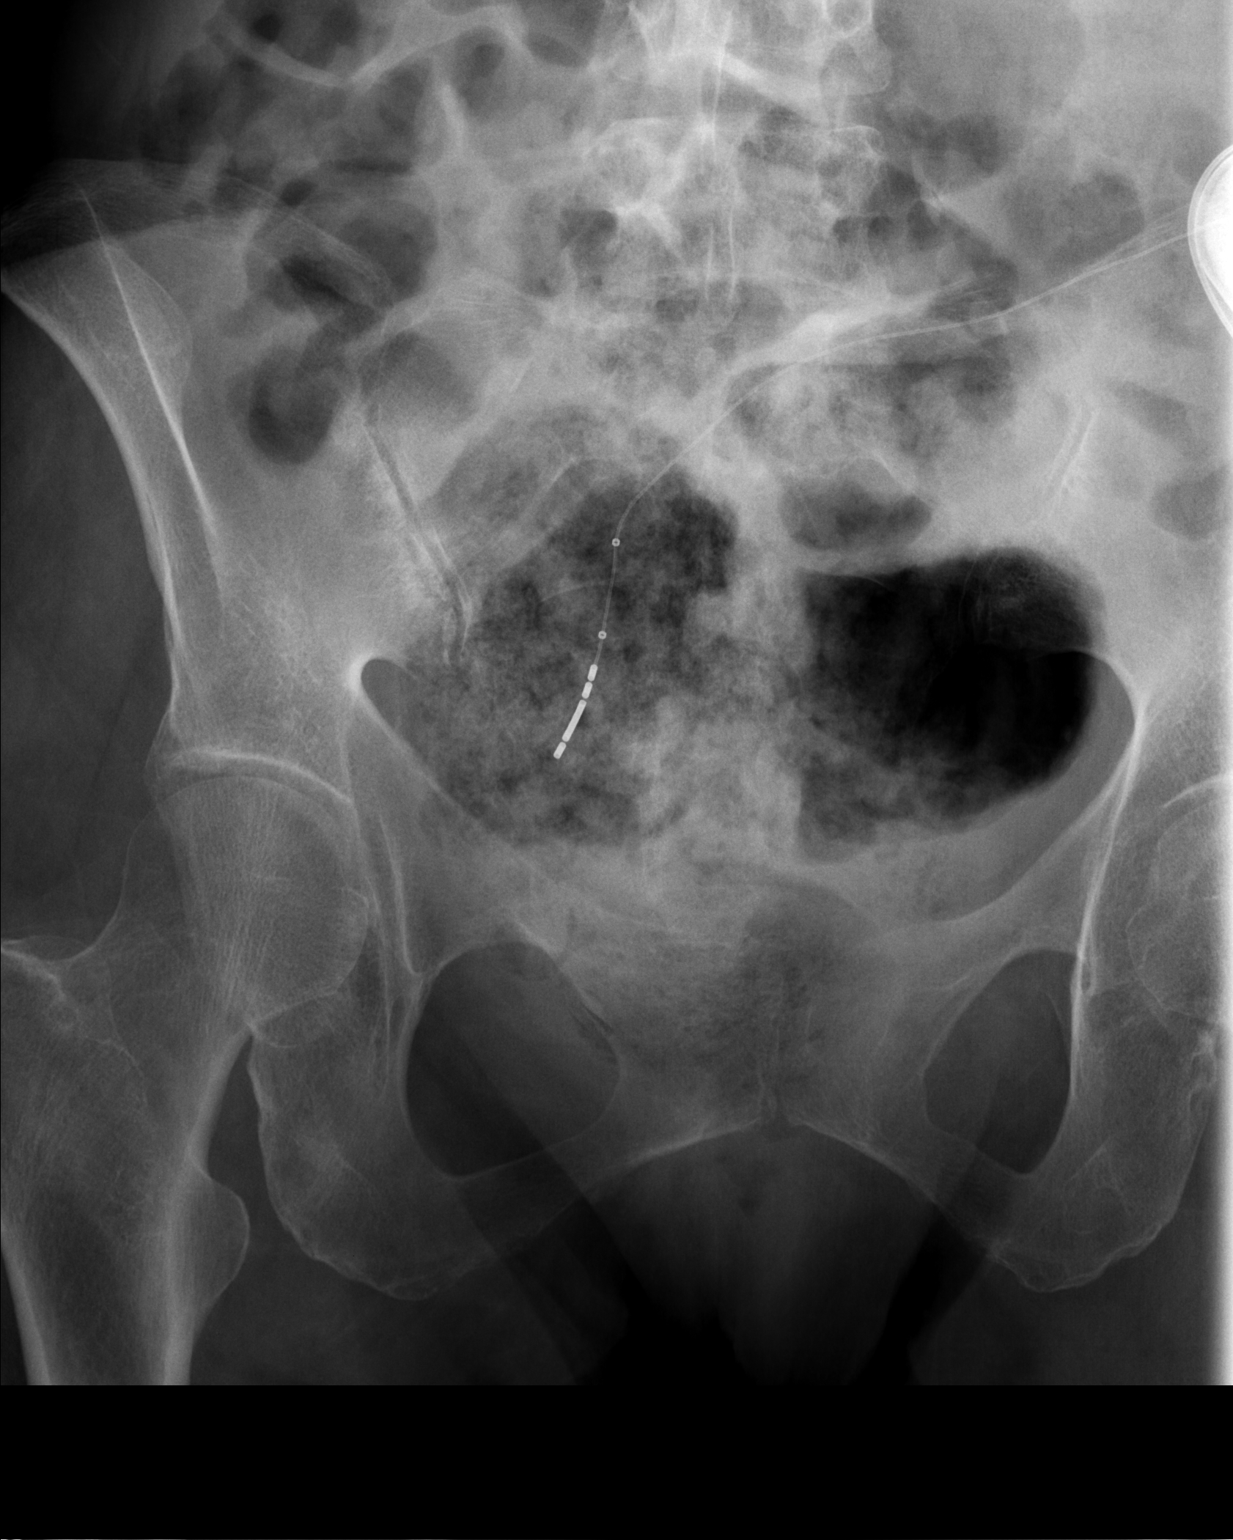

[t coccyx a.p.]
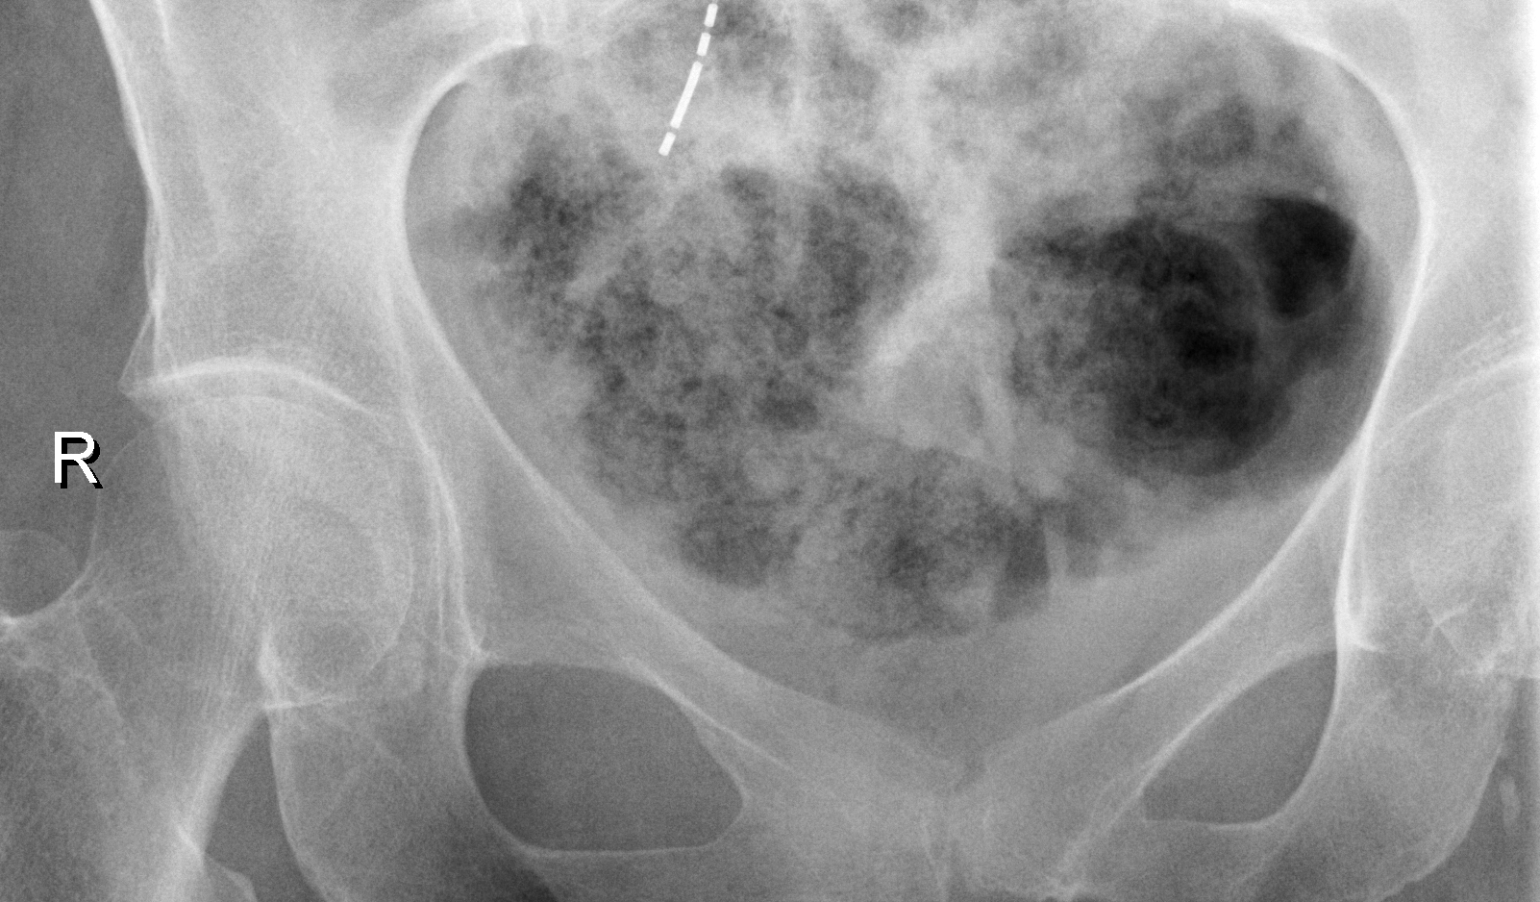

[t sacrum lat *]
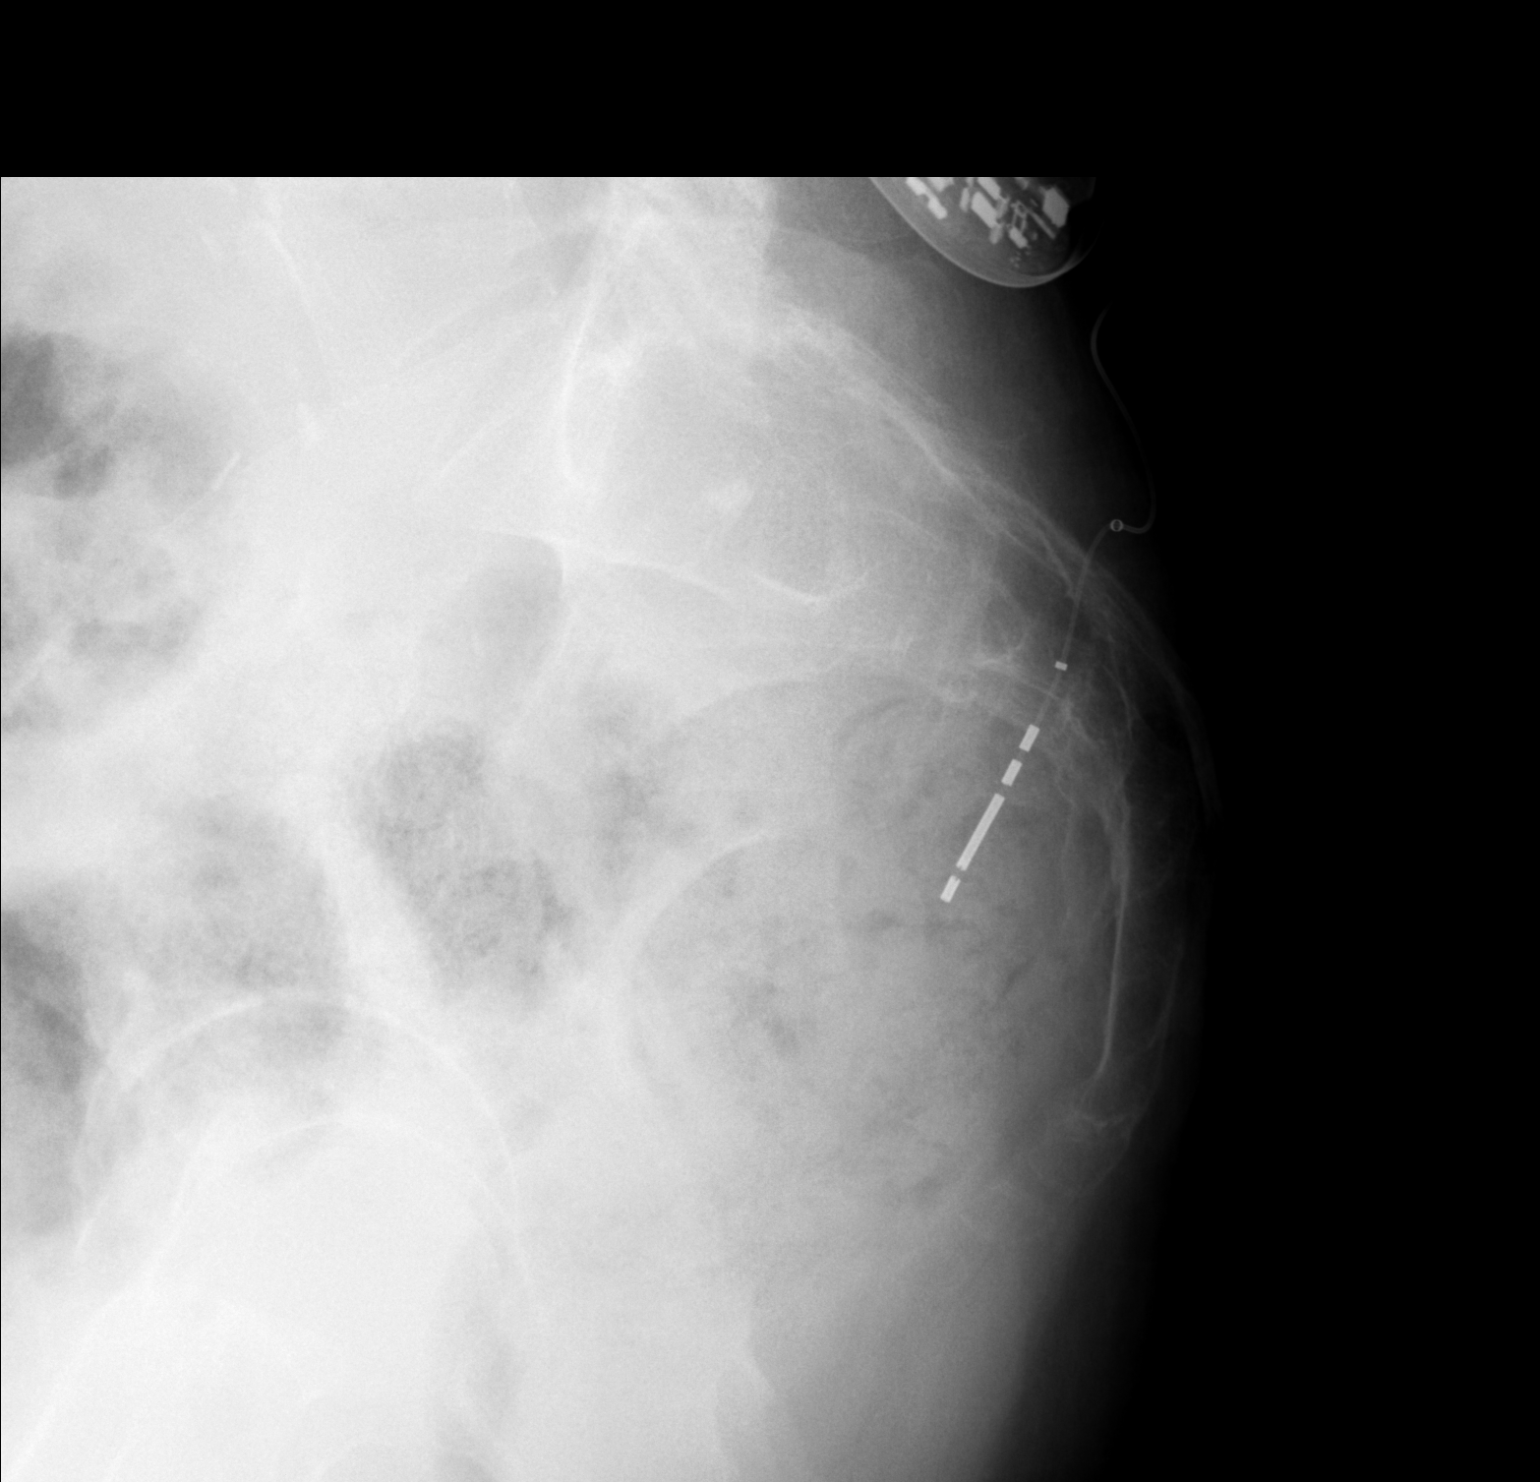

[3 of 3 positions shown; findings below may reference images not displayed]

IMPRESSION: Bony demineralization without definite sacrococcygeal fracture.

## 2007-12-16 ENCOUNTER — Telehealth (INDEPENDENT_AMBULATORY_CARE_PROVIDER_SITE_OTHER): Payer: Self-pay | Admitting: *Deleted

## 2007-12-16 LAB — CONVERTED CEMR LAB
ALT: 18 units/L (ref 0–35)
AST: 26 units/L (ref 0–37)
Albumin: 3.8 g/dL (ref 3.5–5.2)
Alkaline Phosphatase: 81 units/L (ref 39–117)
BUN: 12 mg/dL (ref 6–23)
Basophils Absolute: 0 10*3/uL (ref 0.0–0.1)
Basophils Relative: 0.1 % (ref 0.0–3.0)
Bilirubin, Direct: 0.1 mg/dL (ref 0.0–0.3)
CO2: 31 meq/L (ref 19–32)
Calcium: 8.5 mg/dL (ref 8.4–10.5)
Chloride: 104 meq/L (ref 96–112)
Cholesterol: 160 mg/dL (ref 0–200)
Creatinine, Ser: 0.8 mg/dL (ref 0.4–1.2)
Eosinophils Absolute: 0.1 10*3/uL (ref 0.0–0.7)
Eosinophils Relative: 1.4 % (ref 0.0–5.0)
Folate: 17.7 ng/mL
GFR calc Af Amer: 93 mL/min
GFR calc non Af Amer: 77 mL/min
Glucose, Bld: 94 mg/dL (ref 70–99)
HCT: 36.3 % (ref 36.0–46.0)
HDL: 44.4 mg/dL (ref >39.0)
Hemoglobin: 12.7 g/dL (ref 12.0–15.0)
Iron: 80 ug/dL (ref 42–145)
LDL Cholesterol: 100 mg/dL — ABNORMAL HIGH (ref 0–99)
Lymphocytes Relative: 9.6 % — ABNORMAL LOW (ref 12.0–46.0)
MCHC: 34.9 g/dL (ref 30.0–36.0)
MCV: 86.7 fL (ref 78.0–100.0)
Monocytes Absolute: 0.4 10*3/uL (ref 0.1–1.0)
Monocytes Relative: 6.7 % (ref 3.0–12.0)
Neutro Abs: 5.6 10*3/uL (ref 1.4–7.7)
Neutrophils Relative %: 82.2 % — ABNORMAL HIGH (ref 43.0–77.0)
Platelets: 306 10*3/uL (ref 150–400)
Potassium: 4.7 meq/L (ref 3.5–5.1)
RBC: 4.18 M/uL (ref 3.87–5.11)
RDW: 13.3 % (ref 11.5–14.6)
Saturation Ratios: 30.1 % (ref 20.0–50.0)
Sodium: 139 meq/L (ref 135–145)
TSH: 1 microintl units/mL (ref 0.35–5.50)
Total Bilirubin: 0.6 mg/dL (ref 0.3–1.2)
Total CHOL/HDL Ratio: 3.6
Total Protein: 6.8 g/dL (ref 6.0–8.3)
Transferrin: 189.9 mg/dL — ABNORMAL LOW (ref >=212.0)
Triglycerides: 76 mg/dL (ref 0–149)
VLDL: 15 mg/dL (ref 0–40)
Vit D, 1,25-Dihydroxy: 28 — ABNORMAL LOW (ref 30–89)
Vitamin B-12: 244 pg/mL (ref 211–911)
WBC: 6.7 10*3/uL (ref 4.5–10.5)

## 2007-12-19 ENCOUNTER — Encounter: Payer: Self-pay | Admitting: Internal Medicine

## 2007-12-29 ENCOUNTER — Telehealth (INDEPENDENT_AMBULATORY_CARE_PROVIDER_SITE_OTHER): Payer: Self-pay | Admitting: *Deleted

## 2008-01-08 ENCOUNTER — Telehealth (INDEPENDENT_AMBULATORY_CARE_PROVIDER_SITE_OTHER): Payer: Self-pay | Admitting: *Deleted

## 2008-01-15 ENCOUNTER — Ambulatory Visit: Payer: Self-pay | Admitting: Family Medicine

## 2008-01-16 ENCOUNTER — Encounter (INDEPENDENT_AMBULATORY_CARE_PROVIDER_SITE_OTHER): Payer: Self-pay | Admitting: *Deleted

## 2008-01-16 LAB — CONVERTED CEMR LAB: Vit D, 1,25-Dihydroxy: 57 (ref 30–89)

## 2008-01-28 ENCOUNTER — Telehealth (INDEPENDENT_AMBULATORY_CARE_PROVIDER_SITE_OTHER): Payer: Self-pay | Admitting: *Deleted

## 2008-02-03 ENCOUNTER — Ambulatory Visit: Payer: Self-pay | Admitting: Family Medicine

## 2008-02-03 ENCOUNTER — Encounter: Payer: Self-pay | Admitting: Family Medicine

## 2008-02-10 ENCOUNTER — Encounter: Payer: Self-pay | Admitting: Family Medicine

## 2008-02-25 ENCOUNTER — Ambulatory Visit: Payer: Self-pay | Admitting: Hematology & Oncology

## 2008-02-26 ENCOUNTER — Encounter (INDEPENDENT_AMBULATORY_CARE_PROVIDER_SITE_OTHER): Payer: Self-pay | Admitting: *Deleted

## 2008-02-26 ENCOUNTER — Telehealth (INDEPENDENT_AMBULATORY_CARE_PROVIDER_SITE_OTHER): Payer: Self-pay | Admitting: *Deleted

## 2008-03-01 ENCOUNTER — Telehealth (INDEPENDENT_AMBULATORY_CARE_PROVIDER_SITE_OTHER): Payer: Self-pay | Admitting: *Deleted

## 2008-03-02 ENCOUNTER — Ambulatory Visit: Payer: Self-pay | Admitting: Family Medicine

## 2008-03-03 ENCOUNTER — Telehealth (INDEPENDENT_AMBULATORY_CARE_PROVIDER_SITE_OTHER): Payer: Self-pay | Admitting: *Deleted

## 2008-03-03 ENCOUNTER — Encounter (INDEPENDENT_AMBULATORY_CARE_PROVIDER_SITE_OTHER): Payer: Self-pay | Admitting: *Deleted

## 2008-03-03 LAB — CONVERTED CEMR LAB
BUN: 11 mg/dL (ref 6–23)
Basophils Absolute: 0.1 10*3/uL (ref 0.0–0.1)
Basophils Relative: 1.4 % (ref 0.0–3.0)
CO2: 30 meq/L (ref 19–32)
Calcium: 8.4 mg/dL (ref 8.4–10.5)
Chloride: 104 meq/L (ref 96–112)
Creatinine, Ser: 0.7 mg/dL (ref 0.4–1.2)
Eosinophils Absolute: 0.1 10*3/uL (ref 0.0–0.7)
Eosinophils Relative: 1.1 % (ref 0.0–5.0)
GFR calc Af Amer: 109 mL/min
GFR calc non Af Amer: 90 mL/min
Glucose, Bld: 86 mg/dL (ref 70–99)
HCT: 34.7 % — ABNORMAL LOW (ref 36.0–46.0)
Hemoglobin: 11.8 g/dL — ABNORMAL LOW (ref 12.0–15.0)
Lymphocytes Relative: 13.4 % (ref 12.0–46.0)
MCHC: 34.2 g/dL (ref 30.0–36.0)
MCV: 88.2 fL (ref 78.0–100.0)
Monocytes Absolute: 0.5 10*3/uL (ref 0.1–1.0)
Monocytes Relative: 7.5 % (ref 3.0–12.0)
Neutro Abs: 4.9 10*3/uL (ref 1.4–7.7)
Neutrophils Relative %: 76.6 % (ref 43.0–77.0)
Platelets: 268 10*3/uL (ref 150–400)
Potassium: 3.9 meq/L (ref 3.5–5.1)
RBC: 3.93 M/uL (ref 3.87–5.11)
RDW: 12.5 % (ref 11.5–14.6)
Sodium: 141 meq/L (ref 135–145)
WBC: 6.5 10*3/uL (ref 4.5–10.5)

## 2008-03-05 ENCOUNTER — Ambulatory Visit: Payer: Self-pay | Admitting: Family Medicine

## 2008-03-08 ENCOUNTER — Encounter: Payer: Self-pay | Admitting: Internal Medicine

## 2008-03-08 LAB — CBC WITH DIFFERENTIAL (CANCER CENTER ONLY)
BASO#: 0 10*3/uL (ref 0.0–0.2)
BASO%: 0.5 % (ref 0.0–2.0)
EOS%: 1.6 % (ref 0.0–7.0)
Eosinophils Absolute: 0.1 10*3/uL (ref 0.0–0.5)
HCT: 31.6 % — ABNORMAL LOW (ref 34.8–46.6)
HGB: 10.8 g/dL — ABNORMAL LOW (ref 11.6–15.9)
LYMPH#: 0.9 10*3/uL (ref 0.9–3.3)
LYMPH%: 10.5 % — ABNORMAL LOW (ref 14.0–48.0)
MCH: 29.2 pg (ref 26.0–34.0)
MCHC: 34.2 g/dL (ref 32.0–36.0)
MCV: 85 fL (ref 81–101)
MONO#: 0.5 10*3/uL (ref 0.1–0.9)
MONO%: 5.7 % (ref 0.0–13.0)
NEUT#: 6.7 10*3/uL — ABNORMAL HIGH (ref 1.5–6.5)
NEUT%: 81.7 % — ABNORMAL HIGH (ref 39.6–80.0)
Platelets: 361 10*3/uL (ref 145–400)
RBC: 3.71 10*6/uL (ref 3.70–5.32)
RDW: 13.2 % (ref 10.5–14.6)
WBC: 8.2 10*3/uL (ref 3.9–10.0)

## 2008-03-08 LAB — FERRITIN: Ferritin: 486 ng/mL — ABNORMAL HIGH (ref 10–291)

## 2008-03-08 LAB — RETICULOCYTES (CHCC)
ABS Retic: 50.4 10*3/uL (ref 19.0–186.0)
RBC.: 3.88 MIL/uL (ref 3.87–5.11)
Retic Ct Pct: 1.3 % (ref 0.4–3.1)

## 2008-03-08 LAB — CHCC SATELLITE - SMEAR

## 2008-03-18 ENCOUNTER — Ambulatory Visit: Payer: Self-pay | Admitting: Family Medicine

## 2008-03-22 ENCOUNTER — Telehealth (INDEPENDENT_AMBULATORY_CARE_PROVIDER_SITE_OTHER): Payer: Self-pay | Admitting: *Deleted

## 2008-03-31 ENCOUNTER — Telehealth (INDEPENDENT_AMBULATORY_CARE_PROVIDER_SITE_OTHER): Payer: Self-pay | Admitting: *Deleted

## 2008-04-01 ENCOUNTER — Telehealth (INDEPENDENT_AMBULATORY_CARE_PROVIDER_SITE_OTHER): Payer: Self-pay | Admitting: *Deleted

## 2008-04-14 ENCOUNTER — Telehealth (INDEPENDENT_AMBULATORY_CARE_PROVIDER_SITE_OTHER): Payer: Self-pay | Admitting: *Deleted

## 2008-04-20 ENCOUNTER — Telehealth (INDEPENDENT_AMBULATORY_CARE_PROVIDER_SITE_OTHER): Payer: Self-pay | Admitting: *Deleted

## 2008-04-22 ENCOUNTER — Ambulatory Visit: Payer: Self-pay | Admitting: Family Medicine

## 2008-04-26 ENCOUNTER — Telehealth (INDEPENDENT_AMBULATORY_CARE_PROVIDER_SITE_OTHER): Payer: Self-pay | Admitting: *Deleted

## 2008-04-29 ENCOUNTER — Ambulatory Visit: Payer: Self-pay | Admitting: Family Medicine

## 2008-05-04 ENCOUNTER — Ambulatory Visit: Payer: Self-pay | Admitting: Hematology & Oncology

## 2008-05-05 ENCOUNTER — Ambulatory Visit: Payer: Self-pay | Admitting: Internal Medicine

## 2008-05-06 ENCOUNTER — Encounter: Payer: Self-pay | Admitting: Internal Medicine

## 2008-05-06 LAB — CBC WITH DIFFERENTIAL (CANCER CENTER ONLY)
BASO#: 0 10*3/uL (ref 0.0–0.2)
BASO%: 0.5 % (ref 0.0–2.0)
EOS%: 2 % (ref 0.0–7.0)
Eosinophils Absolute: 0.1 10*3/uL (ref 0.0–0.5)
HCT: 35.3 % (ref 34.8–46.6)
HGB: 11.5 g/dL — ABNORMAL LOW (ref 11.6–15.9)
LYMPH#: 0.6 10*3/uL — ABNORMAL LOW (ref 0.9–3.3)
LYMPH%: 15 % (ref 14.0–48.0)
MCH: 28.3 pg (ref 26.0–34.0)
MCHC: 32.5 g/dL (ref 32.0–36.0)
MCV: 87 fL (ref 81–101)
MONO#: 0.2 10*3/uL (ref 0.1–0.9)
MONO%: 5.9 % (ref 0.0–13.0)
NEUT#: 3.1 10*3/uL (ref 1.5–6.5)
NEUT%: 76.6 % (ref 39.6–80.0)
Platelets: 222 10*3/uL (ref 145–400)
RBC: 4.04 10*6/uL (ref 3.70–5.32)
RDW: 13.4 % (ref 10.5–14.6)
WBC: 4.1 10*3/uL (ref 3.9–10.0)

## 2008-05-06 LAB — CHCC SATELLITE - SMEAR

## 2008-05-08 LAB — ERYTHROPOIETIN: Erythropoietin: 10.3 m[IU]/mL (ref 2.6–34.0)

## 2008-05-08 LAB — TRANSFERRIN RECEPTOR, SOLUABLE: Transferrin Receptor, Soluble: 9.8 nmol/L

## 2008-05-08 LAB — FERRITIN: Ferritin: 620 ng/mL — ABNORMAL HIGH (ref 10–291)

## 2008-05-08 LAB — RETICULOCYTES (CHCC)
ABS Retic: 26.2 10*3/uL (ref 19.0–186.0)
RBC.: 4.37 MIL/uL (ref 3.87–5.11)
Retic Ct Pct: 0.6 % (ref 0.4–3.1)

## 2008-05-12 ENCOUNTER — Ambulatory Visit: Payer: Self-pay | Admitting: Pulmonary Disease

## 2008-05-12 LAB — CONVERTED CEMR LAB
BUN: 21 mg/dL (ref 6–23)
CO2: 32 meq/L (ref 19–32)
Calcium: 8.5 mg/dL (ref 8.4–10.5)
Chloride: 104 meq/L (ref 96–112)
Creatinine, Ser: 0.8 mg/dL (ref 0.4–1.2)
GFR calc Af Amer: 93 mL/min
GFR calc non Af Amer: 77 mL/min
Glucose, Bld: 101 mg/dL — ABNORMAL HIGH (ref 70–99)
Potassium: 4.7 meq/L (ref 3.5–5.1)
Sodium: 141 meq/L (ref 135–145)

## 2008-05-18 ENCOUNTER — Ambulatory Visit: Payer: Self-pay | Admitting: Internal Medicine

## 2008-05-18 ENCOUNTER — Telehealth (INDEPENDENT_AMBULATORY_CARE_PROVIDER_SITE_OTHER): Payer: Self-pay | Admitting: *Deleted

## 2008-05-31 ENCOUNTER — Telehealth (INDEPENDENT_AMBULATORY_CARE_PROVIDER_SITE_OTHER): Payer: Self-pay | Admitting: *Deleted

## 2008-06-01 ENCOUNTER — Telehealth (INDEPENDENT_AMBULATORY_CARE_PROVIDER_SITE_OTHER): Payer: Self-pay | Admitting: *Deleted

## 2008-06-02 ENCOUNTER — Encounter: Payer: Self-pay | Admitting: Internal Medicine

## 2008-06-02 ENCOUNTER — Ambulatory Visit: Payer: Self-pay | Admitting: Internal Medicine

## 2008-06-02 DIAGNOSIS — K2990 Gastroduodenitis, unspecified, without bleeding: Secondary | ICD-10-CM

## 2008-06-02 DIAGNOSIS — K2970 Gastritis, unspecified, without bleeding: Secondary | ICD-10-CM | POA: Insufficient documentation

## 2008-06-02 LAB — CONVERTED CEMR LAB: UREASE: NEGATIVE

## 2008-06-03 ENCOUNTER — Ambulatory Visit: Payer: Self-pay | Admitting: Family Medicine

## 2008-06-03 DIAGNOSIS — K5730 Diverticulosis of large intestine without perforation or abscess without bleeding: Secondary | ICD-10-CM | POA: Insufficient documentation

## 2008-06-04 IMAGING — CR DG ABDOMEN ACUTE W/ 1V CHEST
3 series · 3 of 3 positions shown · non-contrast
Comparison: Abdominal radiograph [DATE] of seven

CLINICAL DATA: Abdominal pain, nausea and vomiting

ACUTE ABDOMEN SERIES (ABDOMEN 2 VIEW & CHEST 1 VIEW)

[view not recorded (1 of 3)]
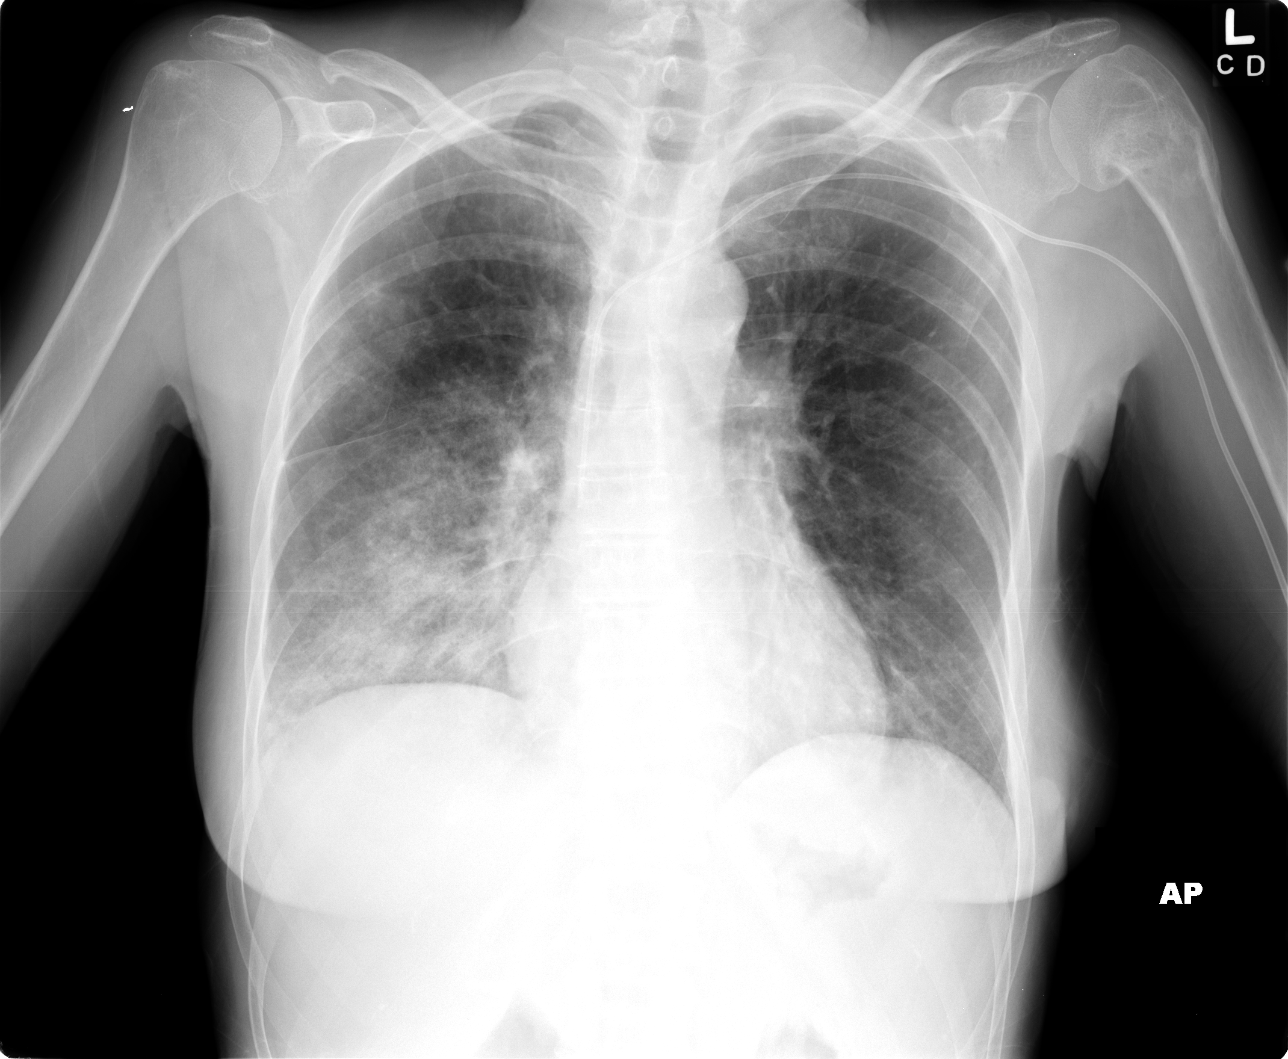

[view not recorded (2 of 3)]
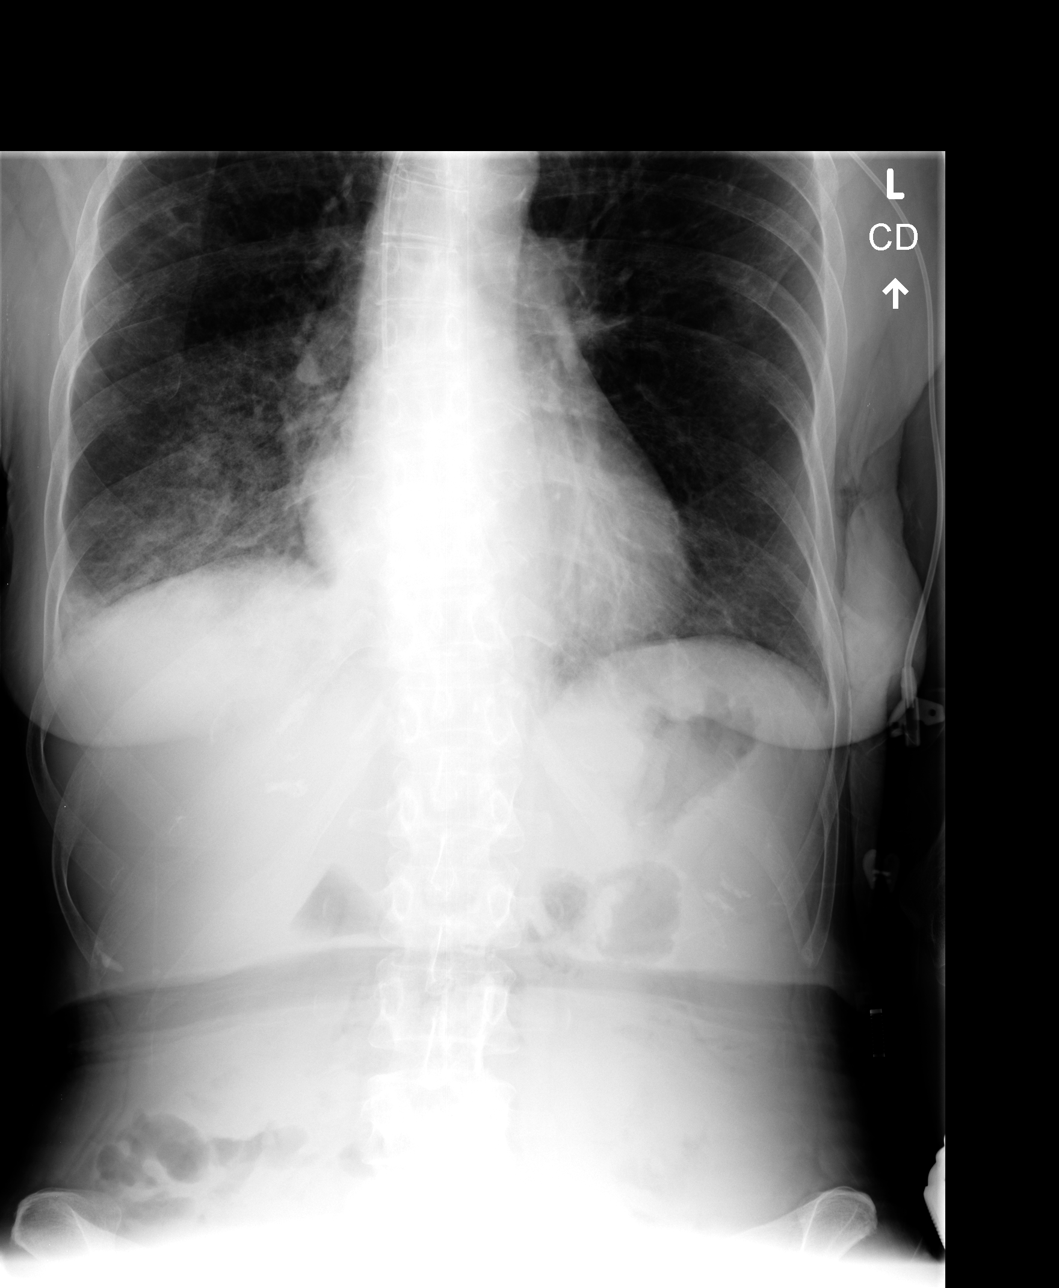

[view not recorded (3 of 3)]
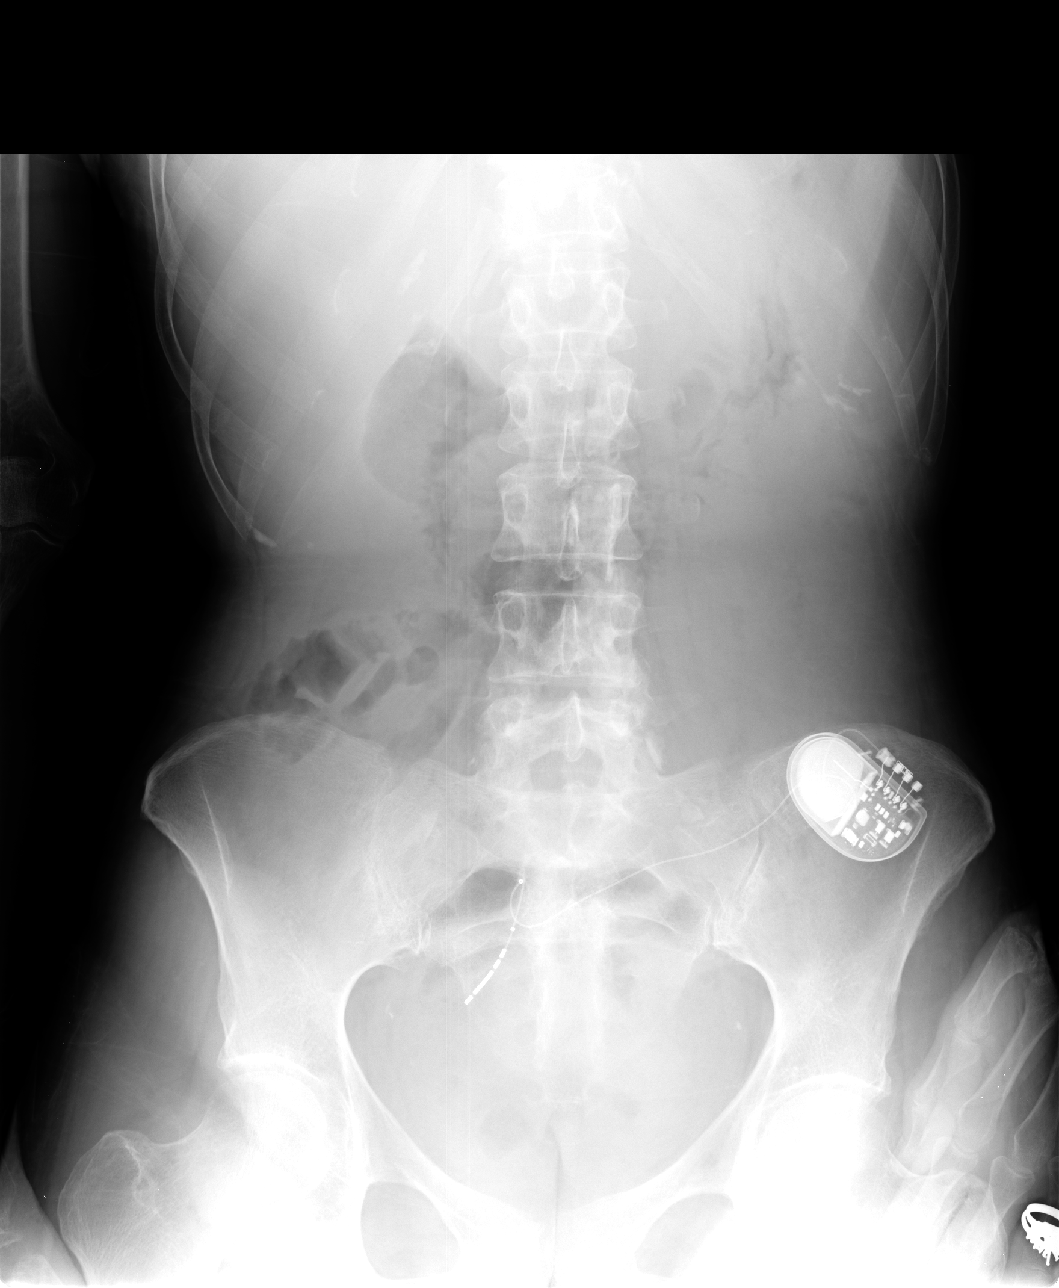

[3 of 3 positions shown; findings below may reference images not displayed]

FINDINGS: Left-sided PICC line tip terminates at the cavoatrial
junction.  There is ill-defined patchy right lower lobe pulmonary
parenchymal opacity and minimal involvement of the right upper
lobe.  Left lung is grossly clear.  No free air beneath the
diaphragms.  S-shaped scoliosis of the thoracolumbar spine noted.
Bowel gas pattern is normal.  A neural stimulator or other
electronic device projects over the pelvis.
IMPRESSION: Patchy right lower lobe and right upper lobe pulmonary opacities,
suspicious for pneumonia.  Alternatively, aspiration, hemorrhage,
asymmetric edema or contusion could have a similar appearance but
these would depend on the clinical context.

No evidence for obstruction or perforation.

## 2008-06-05 ENCOUNTER — Encounter: Payer: Self-pay | Admitting: Internal Medicine

## 2008-06-08 IMAGING — CR DG CHEST 2V
2 series · 2 of 2 positions shown · non-contrast
Comparison: Portable chest 04/21/2004.

CLINICAL DATA: Pneumonia, low oxygen saturation.

CHEST - 2 VIEW

[w chest pa]
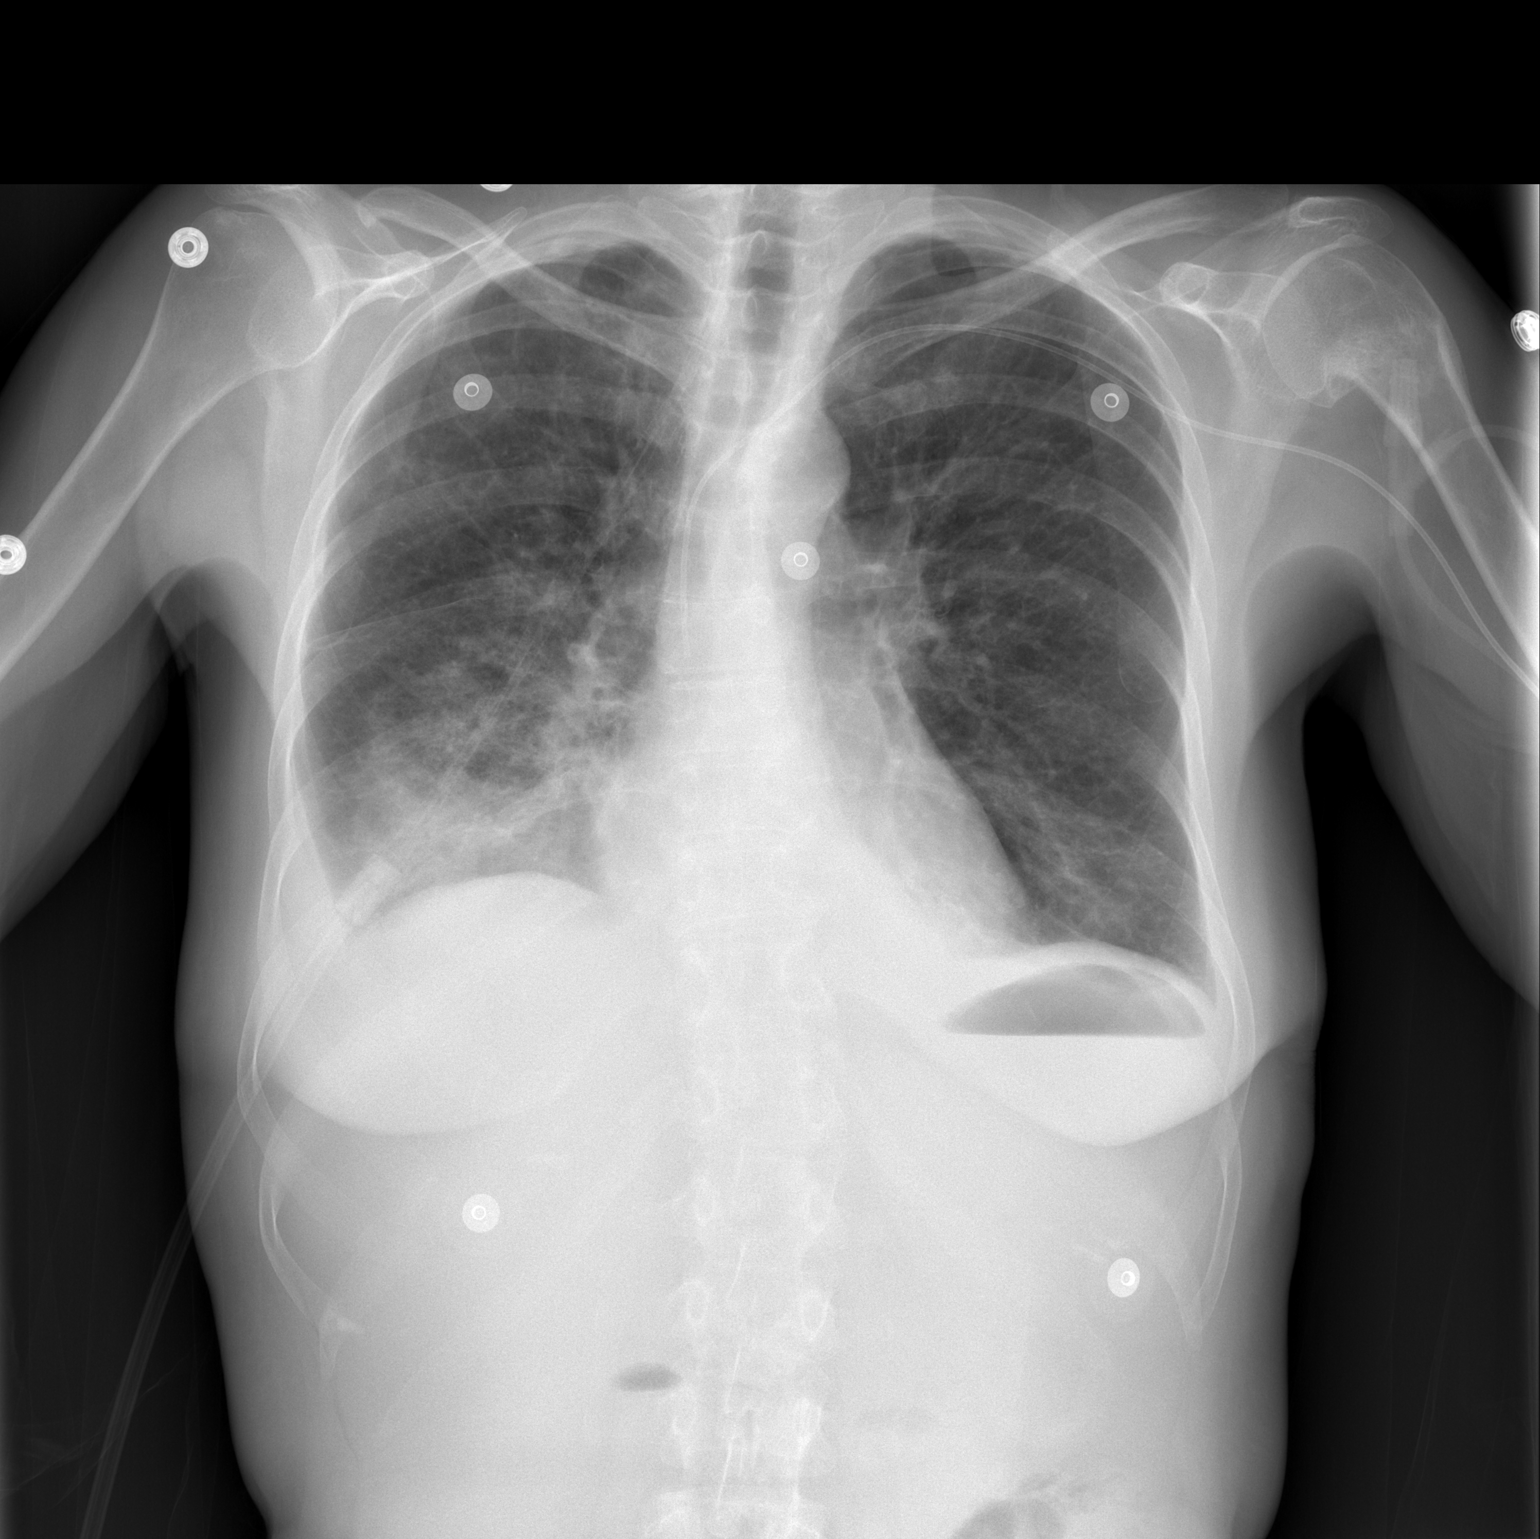

[w chest lat]
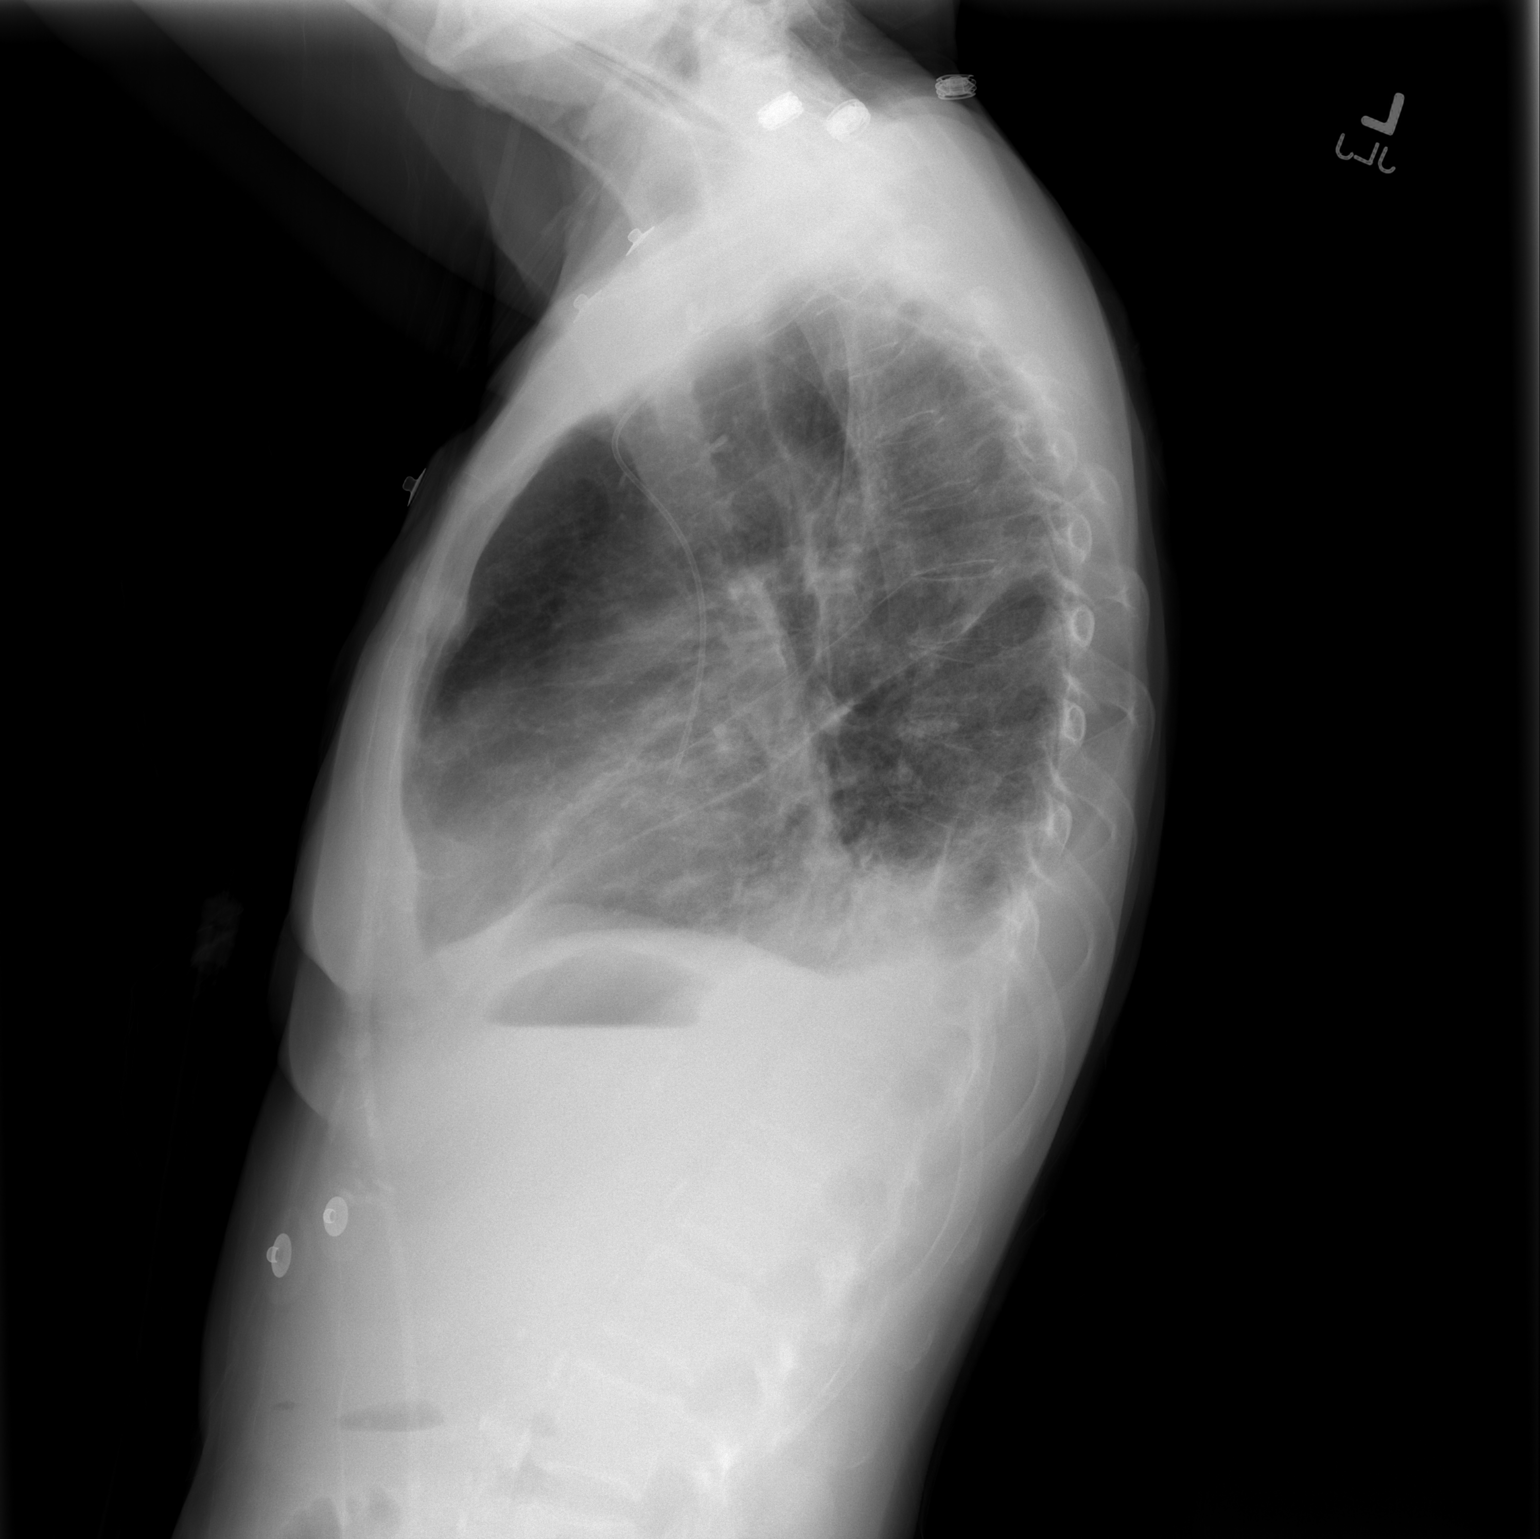

[2 of 2 positions shown; findings below may reference images not displayed]

FINDINGS: The patient has a small right pleural effusion of right
basilar air space disease consistent with pneumonia.  Smaller left
effusion with airspace opacity medial aspect of the left lung base
also noted.  Heart size normal.
IMPRESSION: Bibasilar airspace disease and effusions, greater on the right,
consistent with pneumonia.

## 2008-06-09 IMAGING — CT CT CHEST W/ CM
2 of 5 series · 15 of 36 positions shown, 18 images · IV contrast (omnipaque)
Comparison: Chest radiograph, 05/22/2007, radiograph T [DATE] six

CLINICAL DATA: Pneumonia,

CT CHEST WITH CONTRAST
TECHNIQUE: Multidetector CT imaging of the chest was performed
following the standard protocol during bolus administration of
intravenous contrast.
Contrast: 80 ml Omnipaque

[Series 2: chest_routine 5.0 b40f st · axial · 0.59mm/px · z∈[+1616,+1886]mm · 12 of 64 slices shown, 15 images]
[im 5/64  mediastinal]
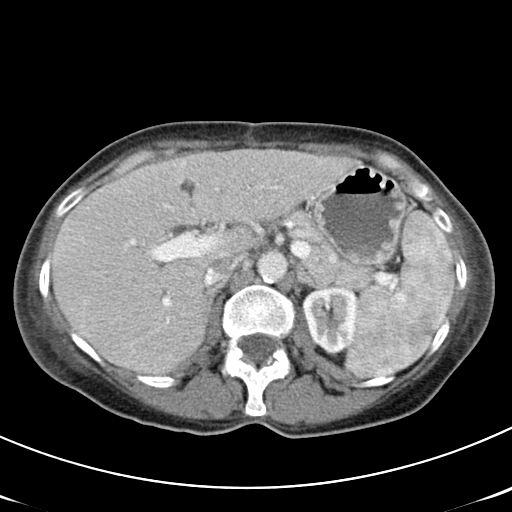
[im 5/64  lung]
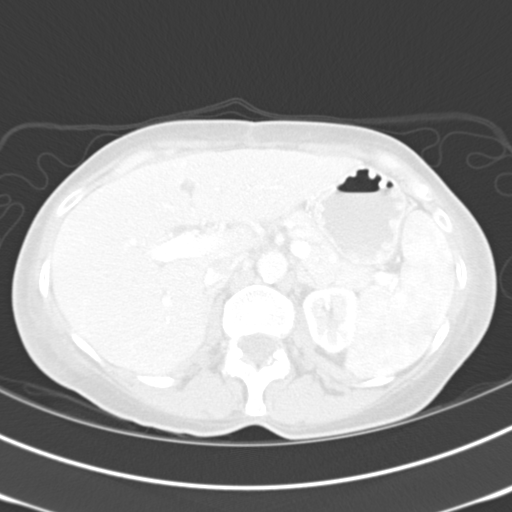
[im 10/64  lung]
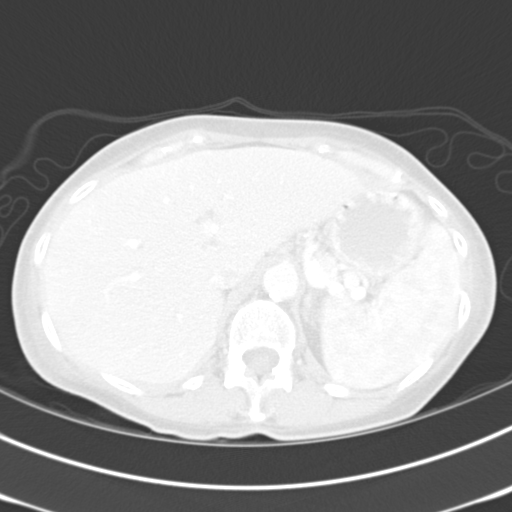
[im 14/64  lung]
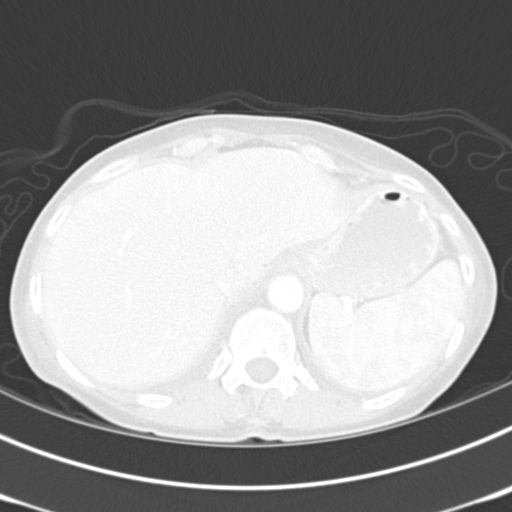
[im 19/64  lung]
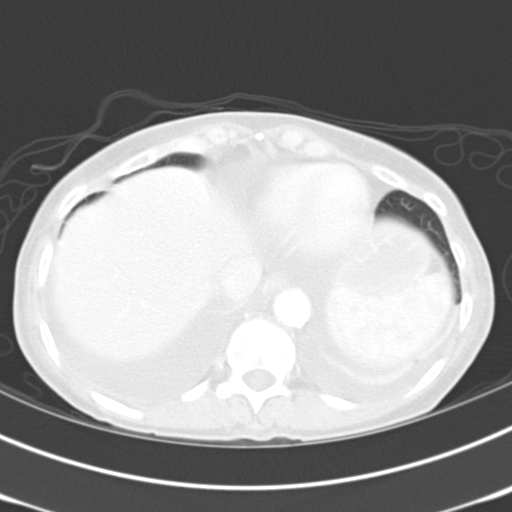
[im 23/64  mediastinal]
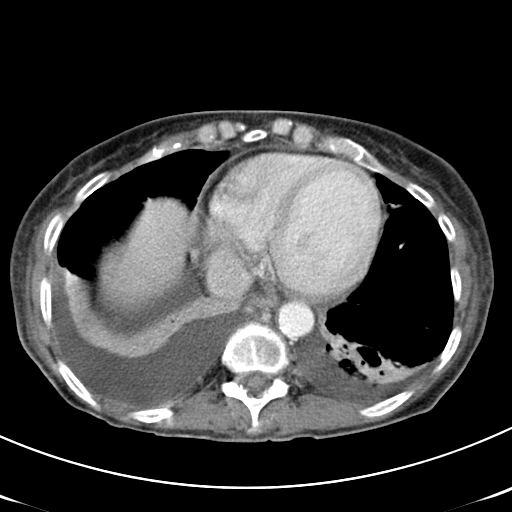
[im 23/64  lung]
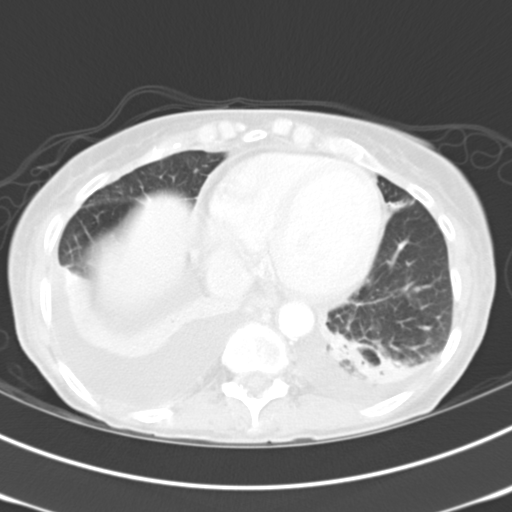
[im 28/64  lung]
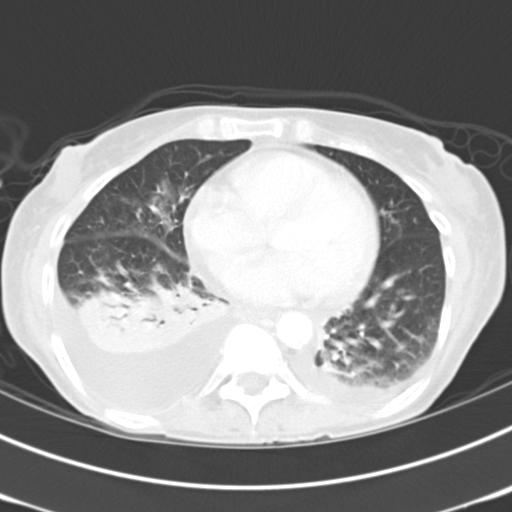
[im 37/64  lung]
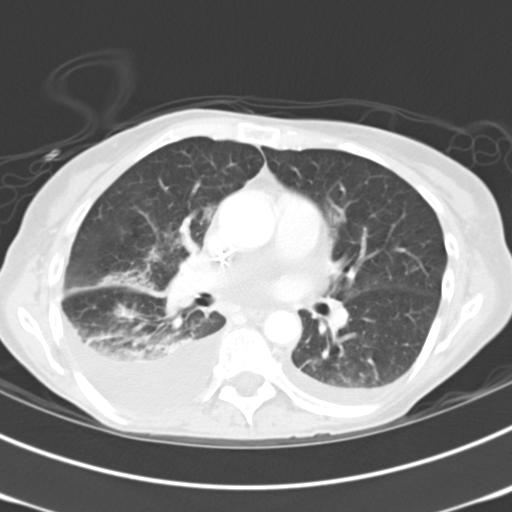
[im 41/64  lung]
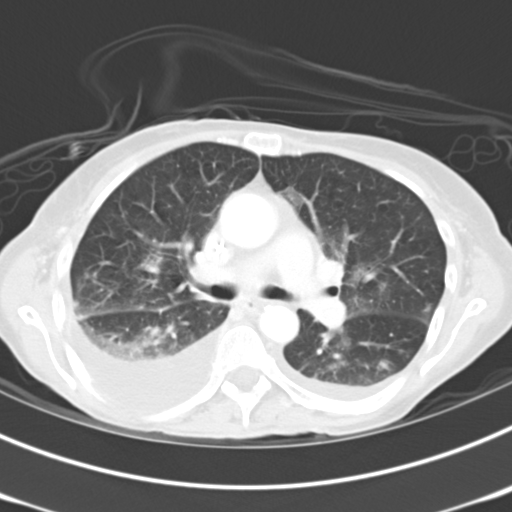
[im 46/64  mediastinal]
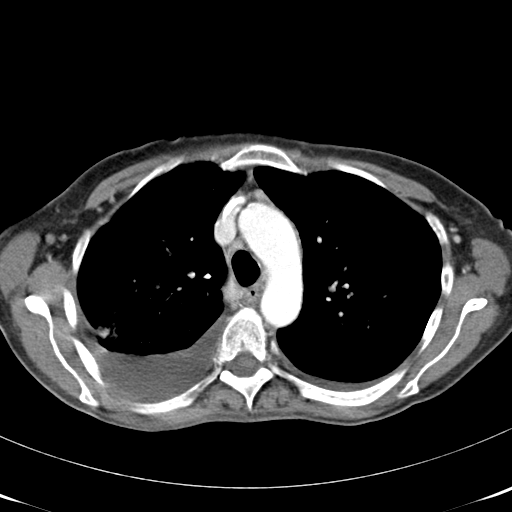
[im 46/64  lung]
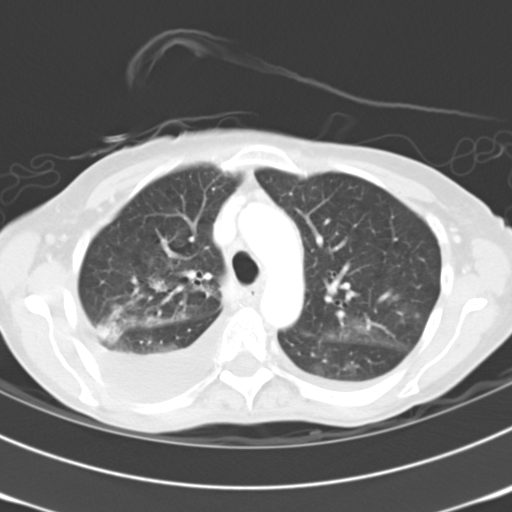
[im 50/64  lung]
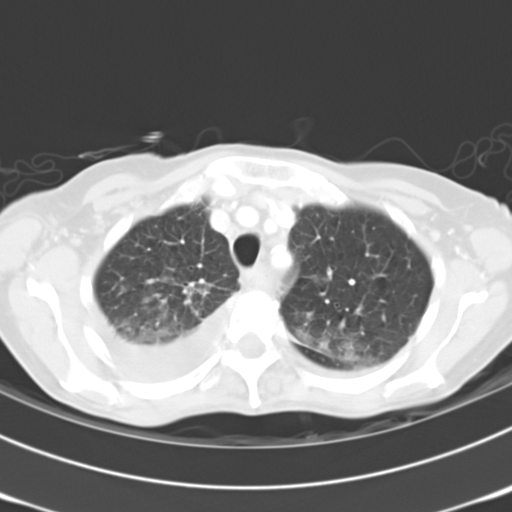
[im 55/64  lung]
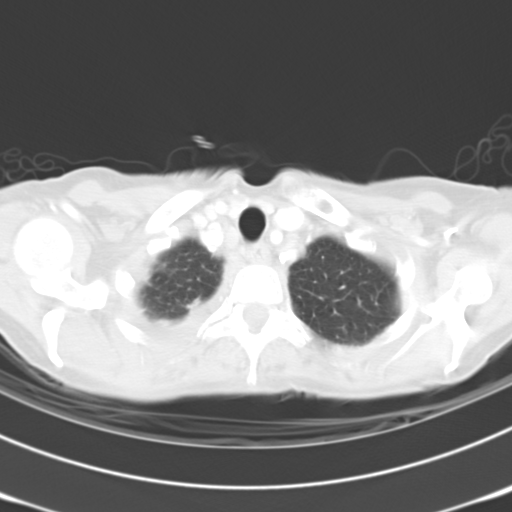
[im 59/64  lung]
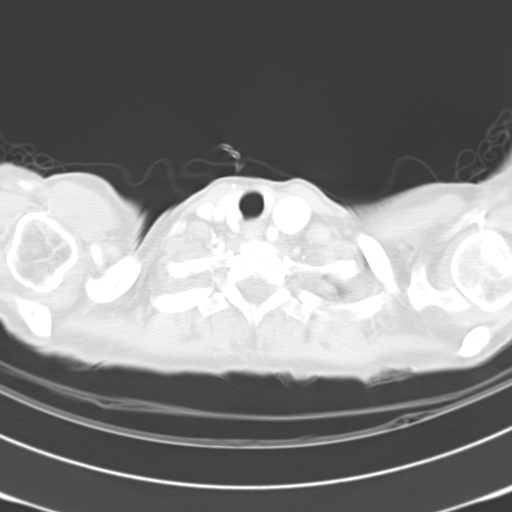

[Series 602: coronal images · coronal · 0.65mm/px · 3 of 71 slices shown]
[im 15/71  lung]
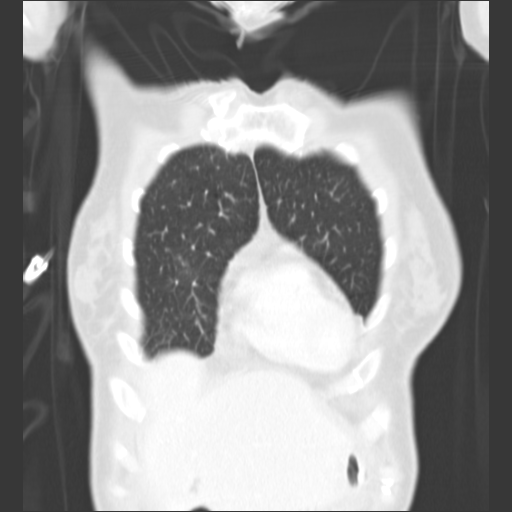
[im 29/71  lung]
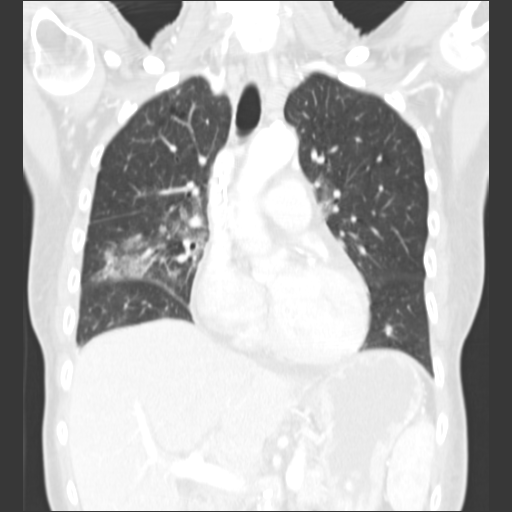
[im 43/71  lung]
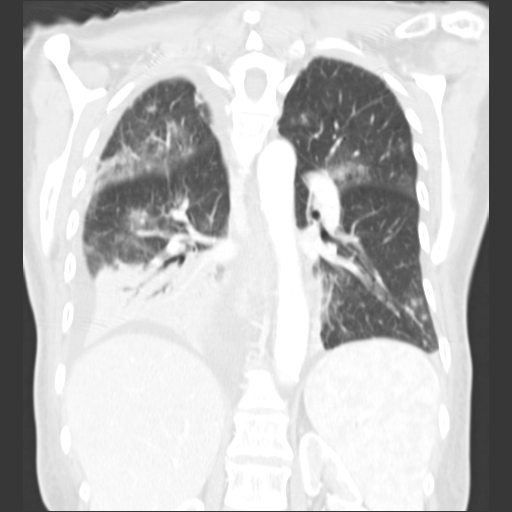

[15 of 36 positions shown; findings below may reference images not displayed]

FINDINGS: There is a moderate layering right pleural effusion and
small left pleural effusion.  There is collapse of the right lower
lobe with air bronchograms representing either a passive
atelectasis versus infiltrate.  There is a central air space
disease within the right middle lobe and superior segment right
lower lobe as well as the left lower lobe and lingula to a lesser
extent.  The airway appears normal.

There is a left PICC line with tip in the distal SVC.  There are
small left and right infraclavicular and hila high paratracheal
nodes.  For  example 8 mm on image 12 and 7 mm on image eight.  No
enlarged lower paratracheal nodes or hilar nodes.  No evidence of
supraclavicular or axillary nodes.

No focal hepatic lesion.  The adrenal glands are partially
visualized.  Spleen appears normal.

Review of the bone windows demonstrates no aggressive osseous
lesions.
IMPRESSION: 1  There is dense consolidation at the right lung base with air
bronchograms with associated large right pleural effusion.  This
likely represents a combination of infection and passive
atelectasis from adjacent large effusion..  No obstructing mass
identified..  Consider bronchoscopy in patient with a chronic
infection

2. .  There is also mild consolidation the left lung base as well
as patchy central air space disease in the upper lobes and lower
lobes suggesting multifocal pneumonia versus of pulmonary edema.
Favor infection.

3.  Small infraclavicular and high paratracheal nodes are not
pathologic by CT size criteria. Attention on follow-up.

## 2008-06-09 IMAGING — CR DG CHEST DECUBITUS*R*
1 series · 1 of 1 positions shown · non-contrast
Comparison: 08/11/2007

CLINICAL DATA: Pleural effusion

CHEST - RIGHT DECUBITUS

[w chest decub.]
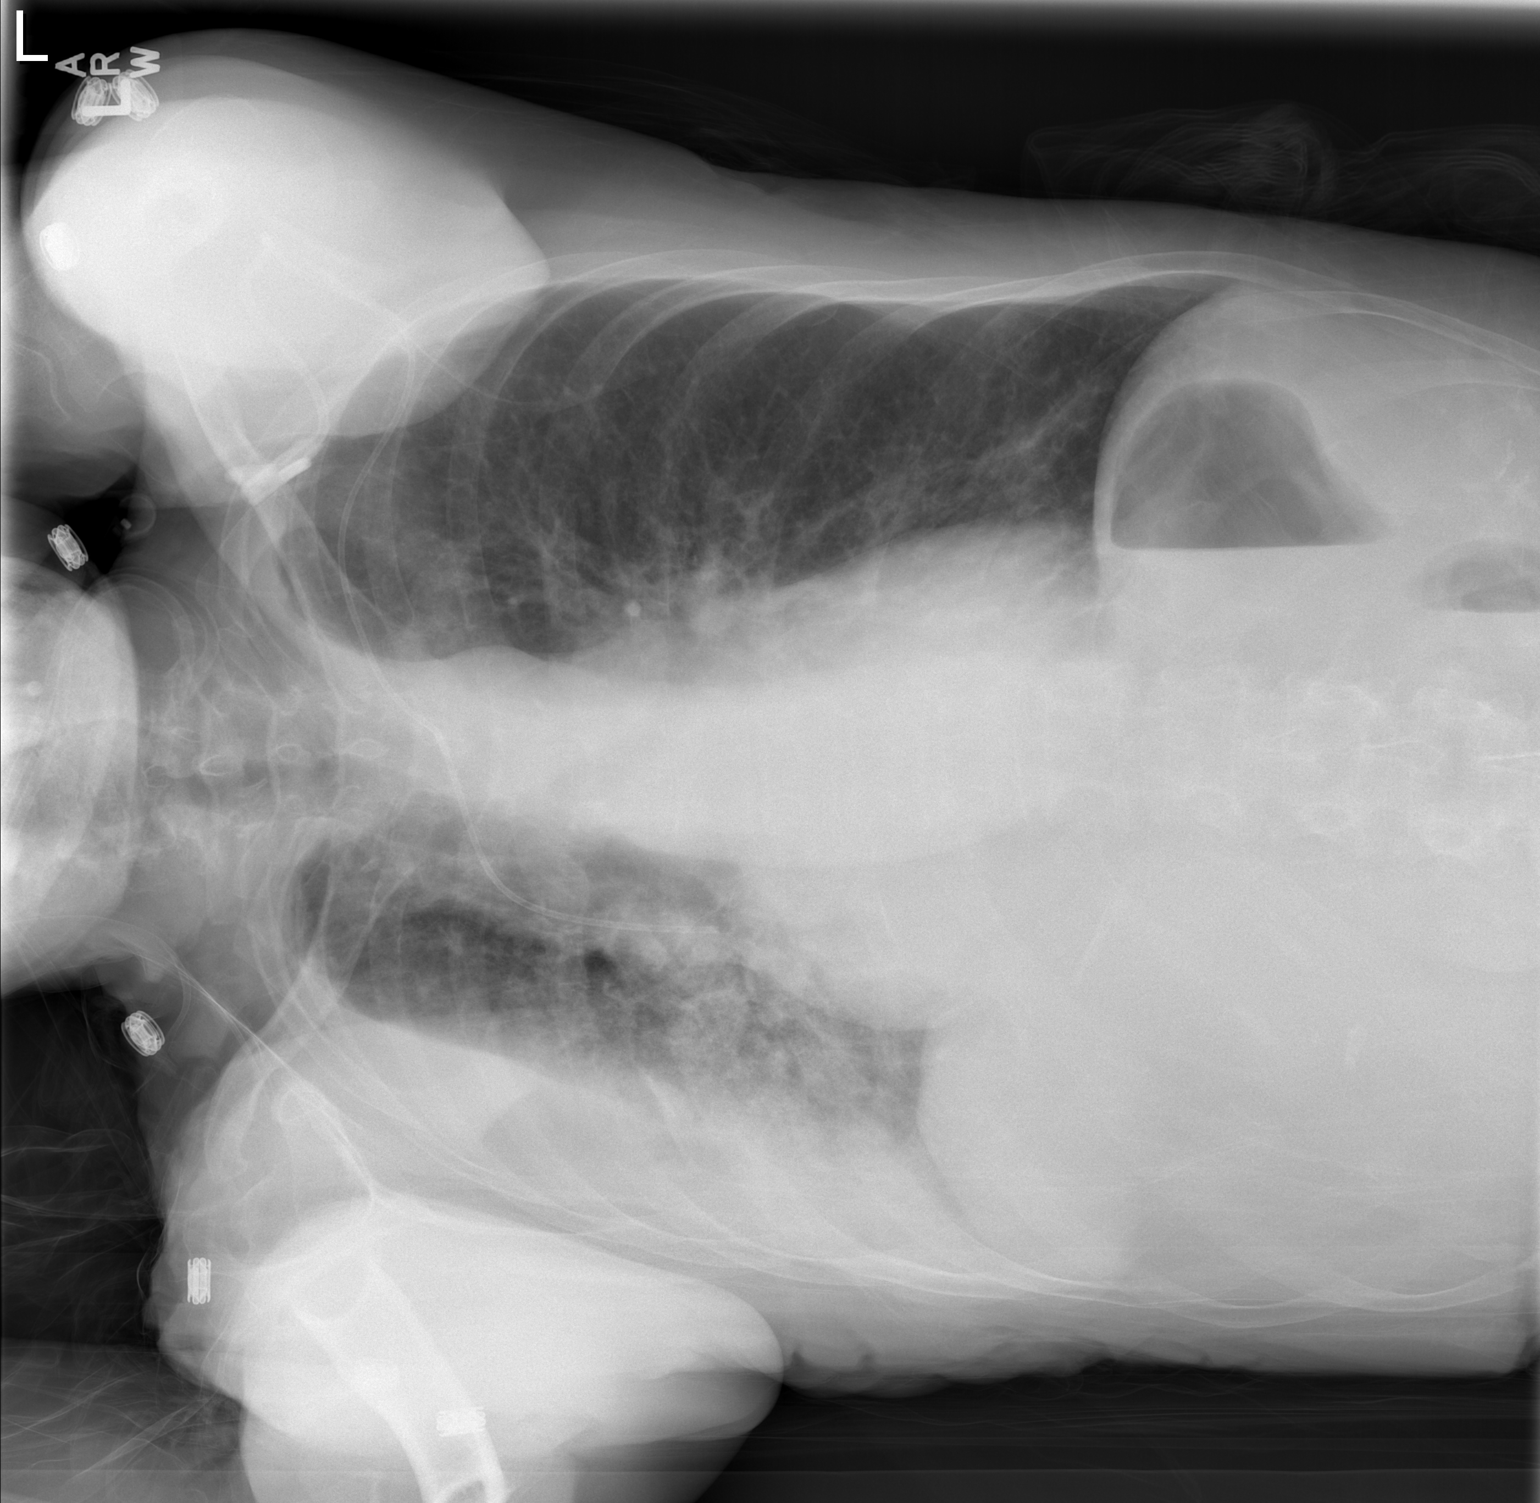

[1 of 1 positions shown; findings below may reference images not displayed]

FINDINGS: Right side down decubitus film demonstrates a moderate
sized free flowing right pleural effusion with overlying
atelectasis.  The left lung is relatively clear.
IMPRESSION: 1.  Moderate sized free flowing right sided pleural effusion with
overlying atelectasis.

## 2008-06-10 ENCOUNTER — Ambulatory Visit: Payer: Self-pay | Admitting: Pulmonary Disease

## 2008-06-10 IMAGING — CR DG CHEST 1V
1 series · 1 of 1 positions shown · non-contrast
Comparison: [DATE]/5669 6066 hours

CLINICAL DATA: Post thoracentesis, tiny pneumothorax, pain

CHEST - 1 VIEW

[w chest pa]
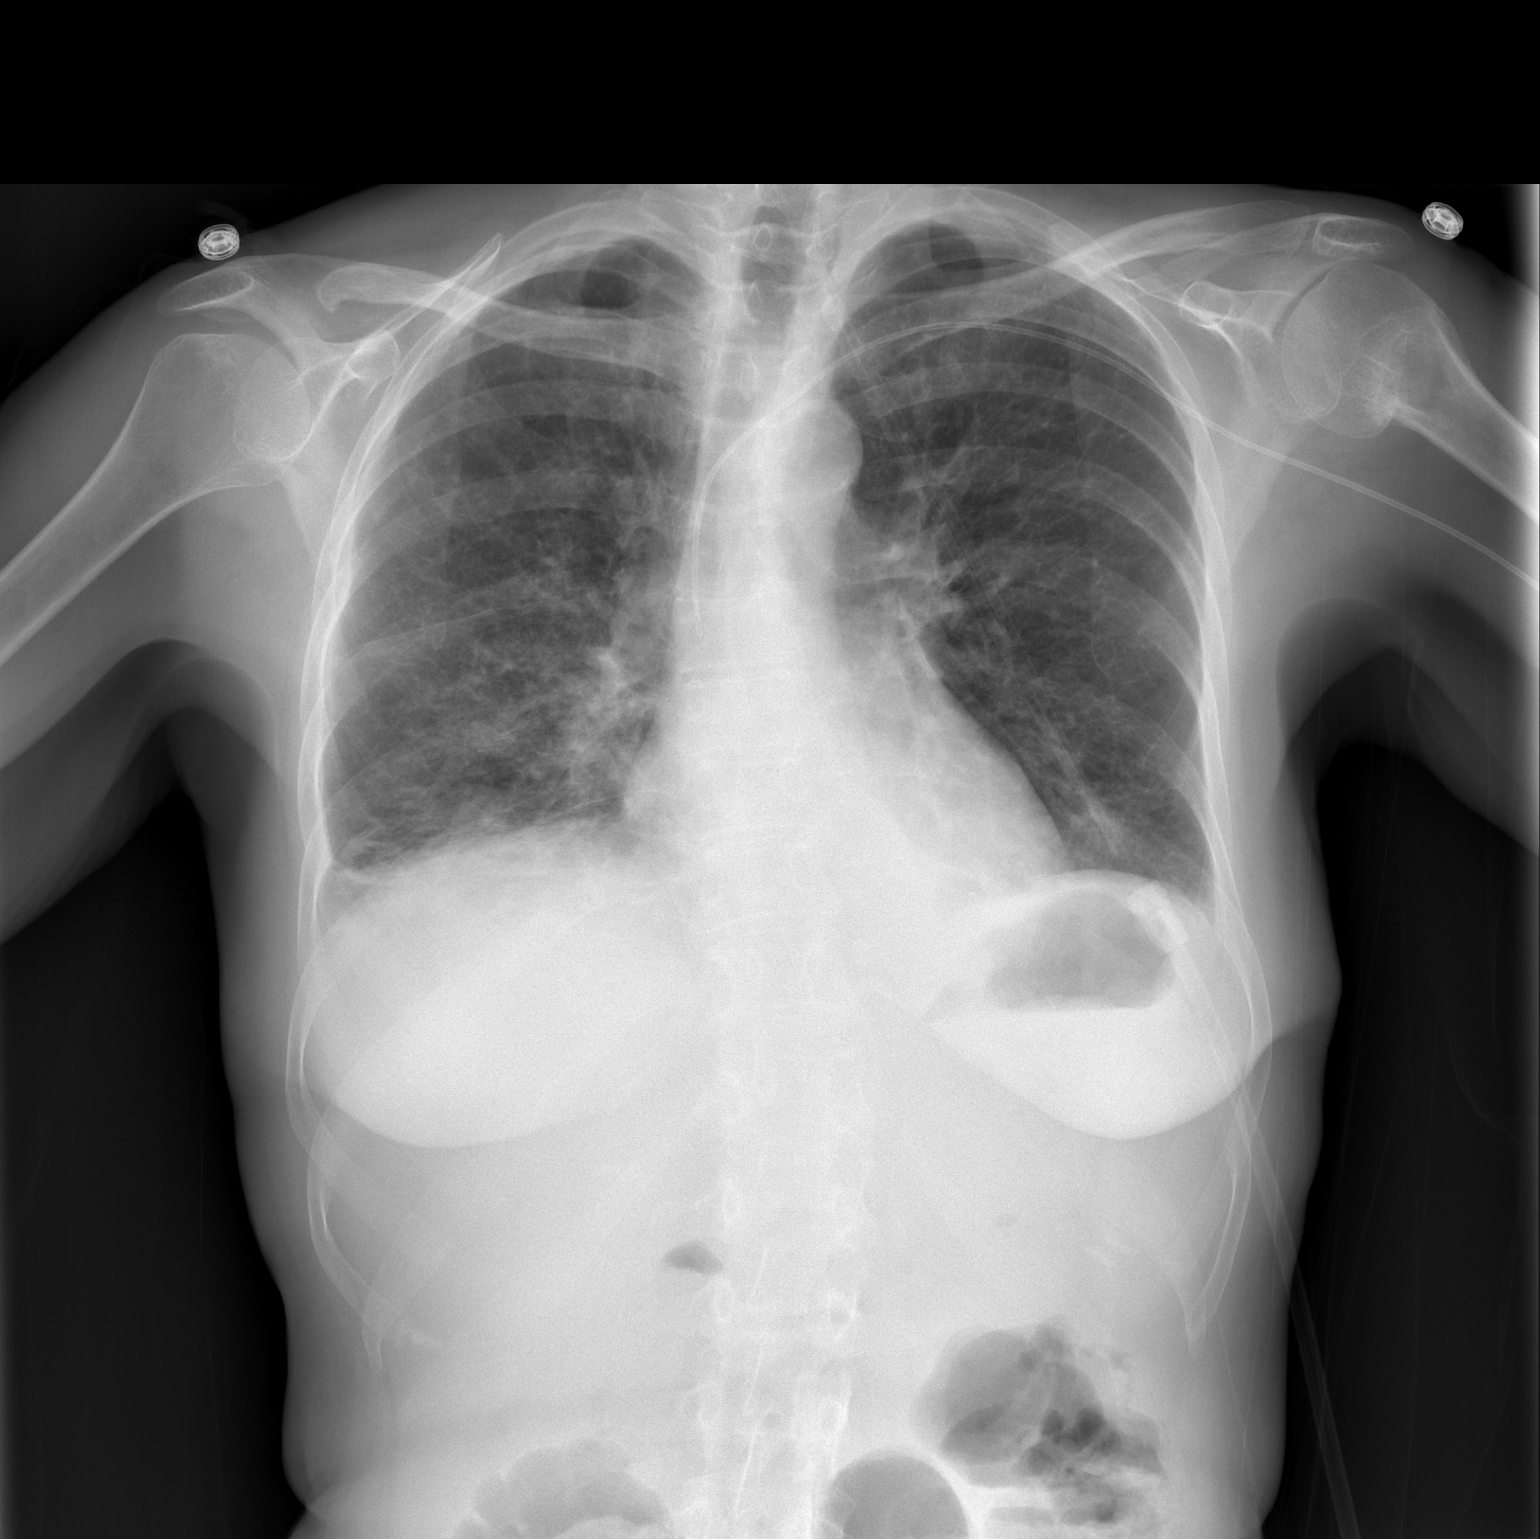

[1 of 1 positions shown; findings below may reference images not displayed]

FINDINGS: The right pneumothorax has resolved.  Right basilar
atelectasis verses airspace disease is stable.  The heart is normal
in size.  The PICC is unchanged.
IMPRESSION: The tiny right pneumothorax has resolved.  Otherwise stable.

## 2008-06-10 IMAGING — US US PARACENTESIS
1 series · 4 of 4 positions shown · non-contrast
Comparison: none

CLINICAL DATA: Right effusion

RIGHTULTRASOUND-GUIDED THORACENTESIS
TECHNIQUE: An ultrasound-guided thoracentesis was thoroughly
discussed with the patient and questions answered.  The benefits,
risks, alternatives and complications were also discussed.  The
patient understands and wishes to proceed with the procedure.  A
verbal as well as written consent was obtained. Ultrasound was
performed to localize and mark an adequate pocket of fluid for
thoracentesis.  The right chest wall was prepped and draped in the
normal sterile fashion.  1% Lidocaine was used for local
anesthesia.  Under ultrasound guidance a 19-gauge Yueh catheter was
introduced yielding approximately 580 ml  of clear fluid.  The
patient tolerated the procedure well and there were no immediate
complications. Post procedure chest x-ray is pending.

[Series 1: unknown · 0.30mm/px · 4 of 4 slices shown]
[im 1/4]
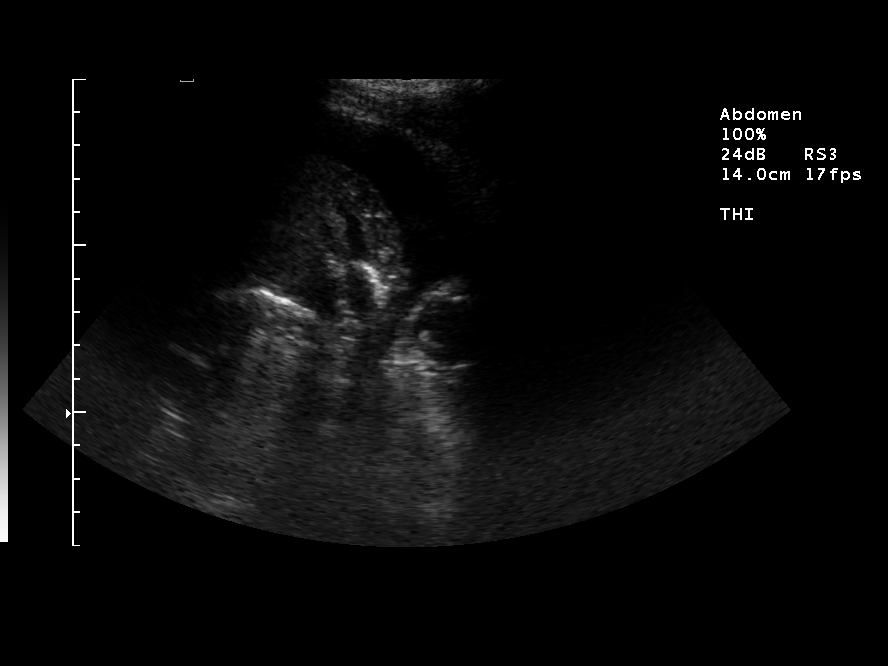
[im 2/4]
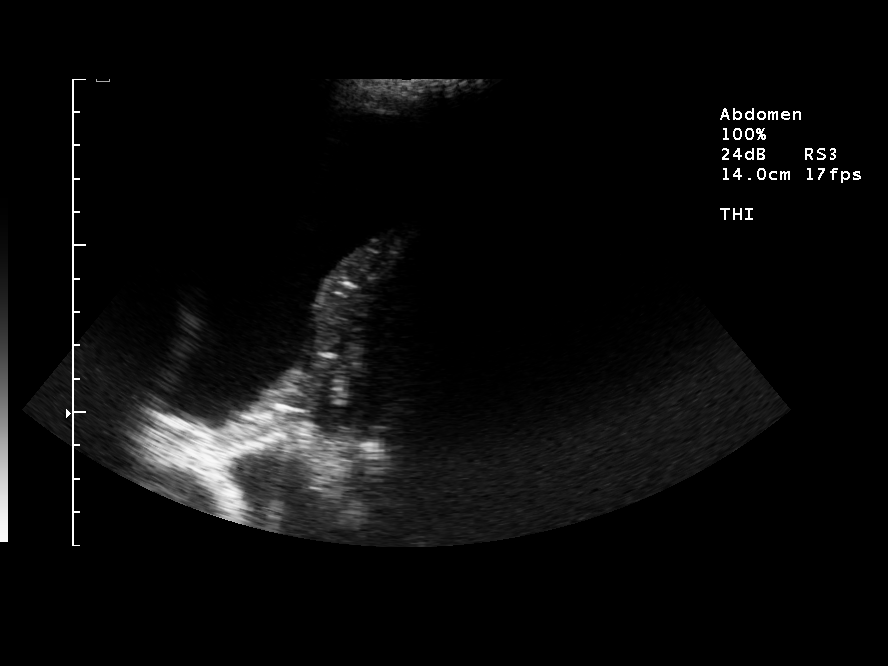
[im 3/4]
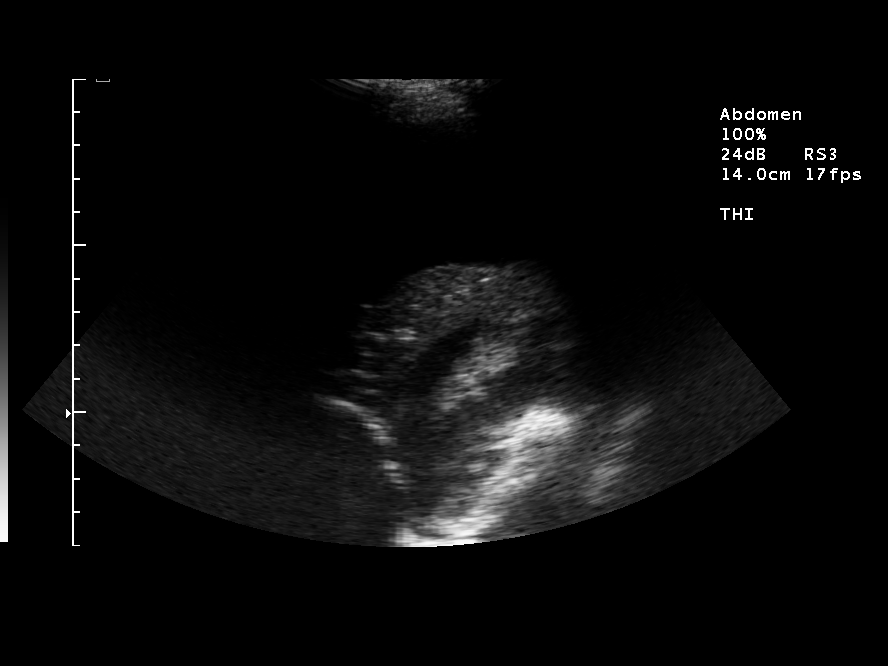
[im 4/4]
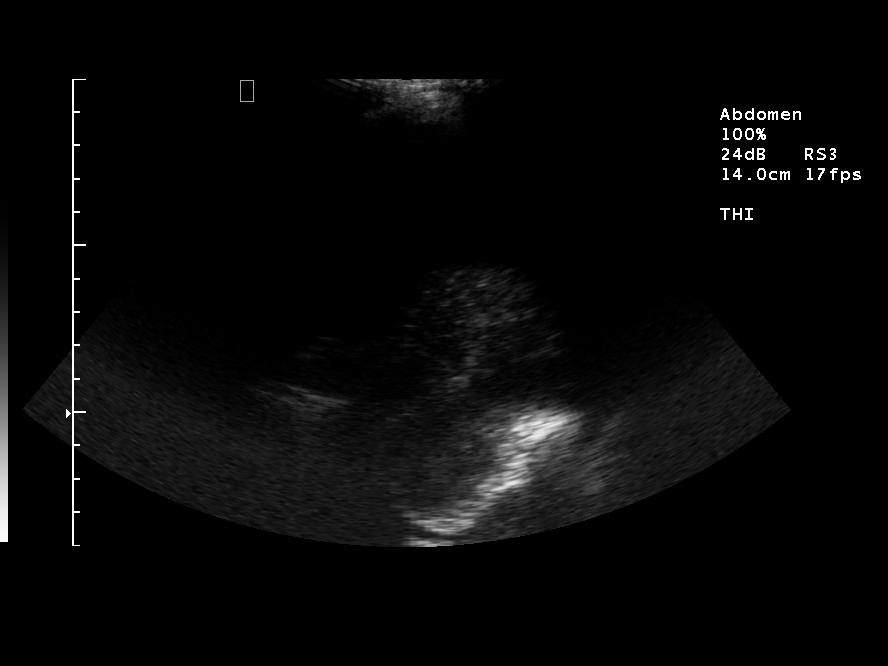

[4 of 4 positions shown; findings below may reference images not displayed]

IMPRESSION: Successful ultrasound-guidedright thoracentesis yielding 580 ml of
fluid.

## 2008-06-10 IMAGING — CR DG CHEST 1V
1 series · 1 of 1 positions shown · non-contrast
Comparison: 08/12/2007 and earlier. CT chest 08/12/2007.

CLINICAL DATA: 61-year-old female status post right thoracentesis.

CHEST - 1 VIEW

[view not recorded]
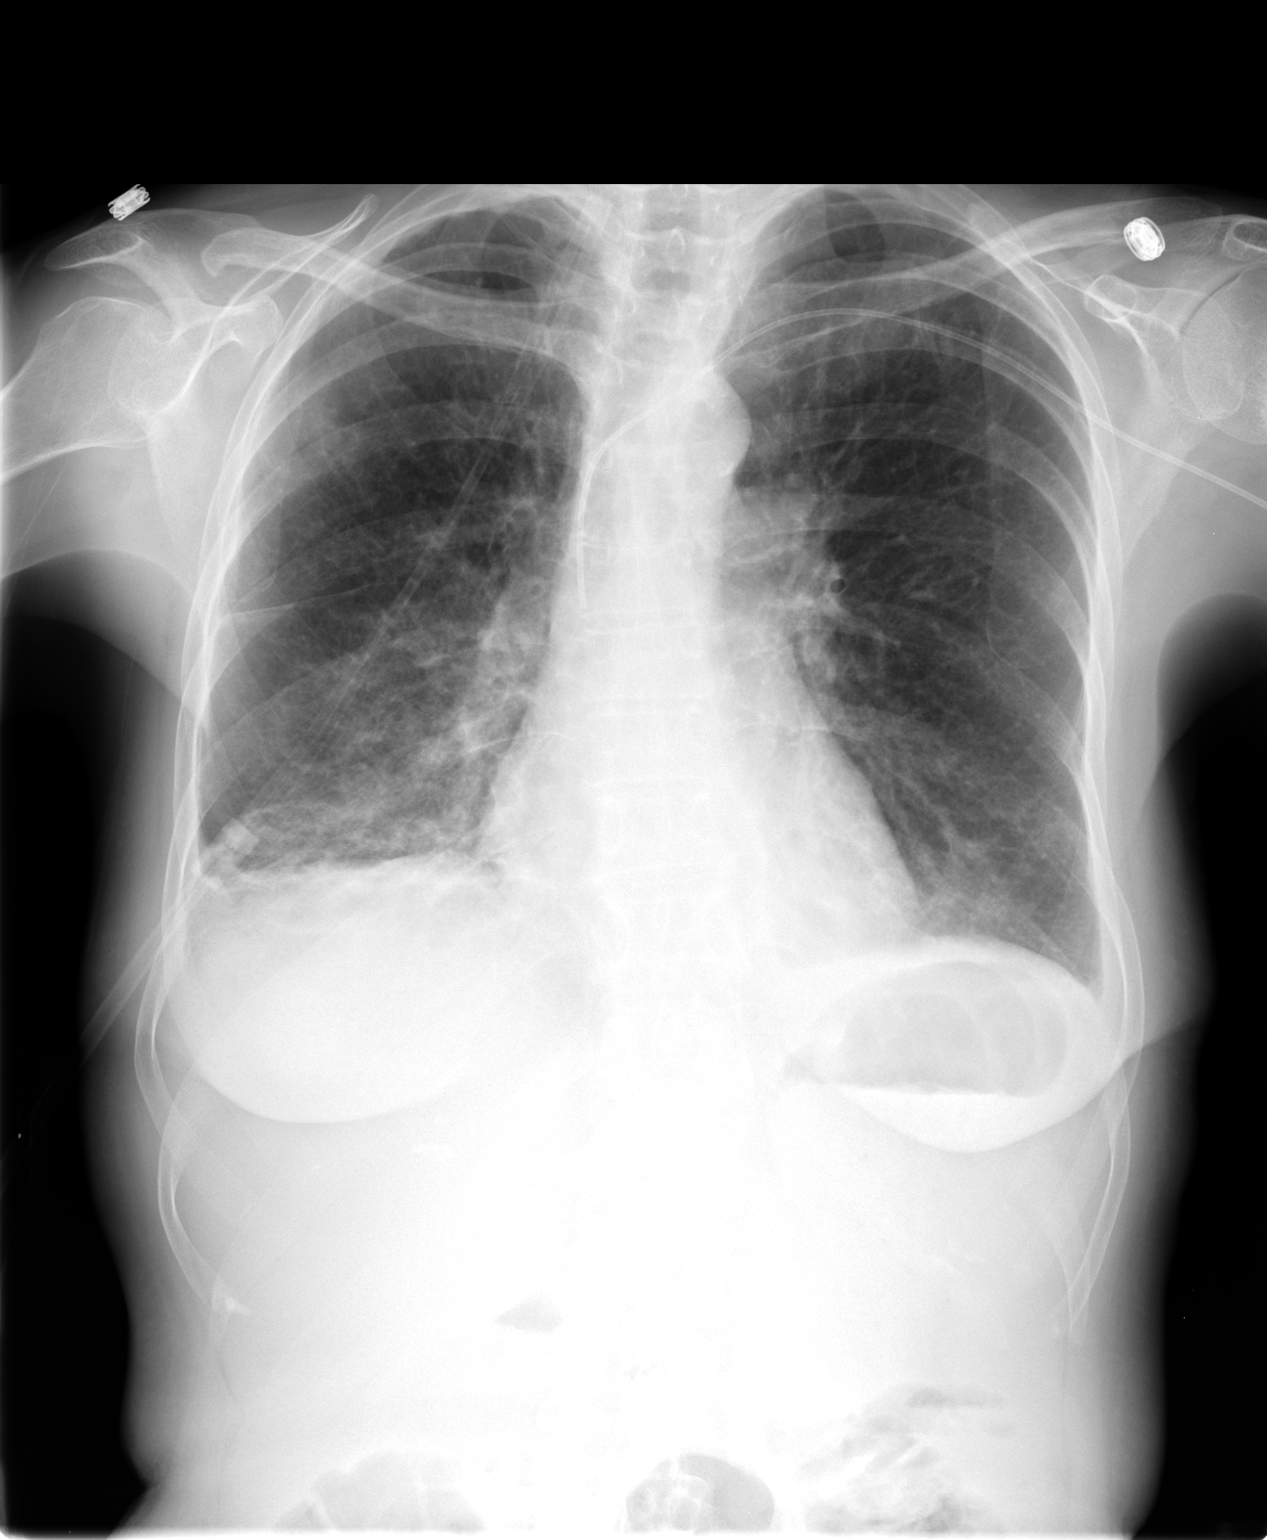

[1 of 1 positions shown; findings below may reference images not displayed]

FINDINGS: Upright frontal view the chest.  Very small pneumothorax
evident along the right lateral lung margin.  Decreased right
pleural effusion with some improved ventilation at the right lung
base.  Unchanged left upper extremity approach PICC line catheter.
Stable cardiac size mediastinal contour.  Stable biapical scarring
patchy airspace opacities in the upper lobes are better visualized
on the comparison CT.
IMPRESSION: 1.  Decreased right pleural effusion with tiny pneumothorax evident
laterally.
2.  Improved ventilation at the right lung base.  Patchy upper lobe
opacities better demonstrated on comparison CT.

## 2008-06-12 IMAGING — CR DG CHEST 2V
2 series · 2 of 2 positions shown · non-contrast
Comparison: 08/13/2007

CLINICAL DATA: Pneumonia, UTI

CHEST - 2 VIEW

[w chest pa]
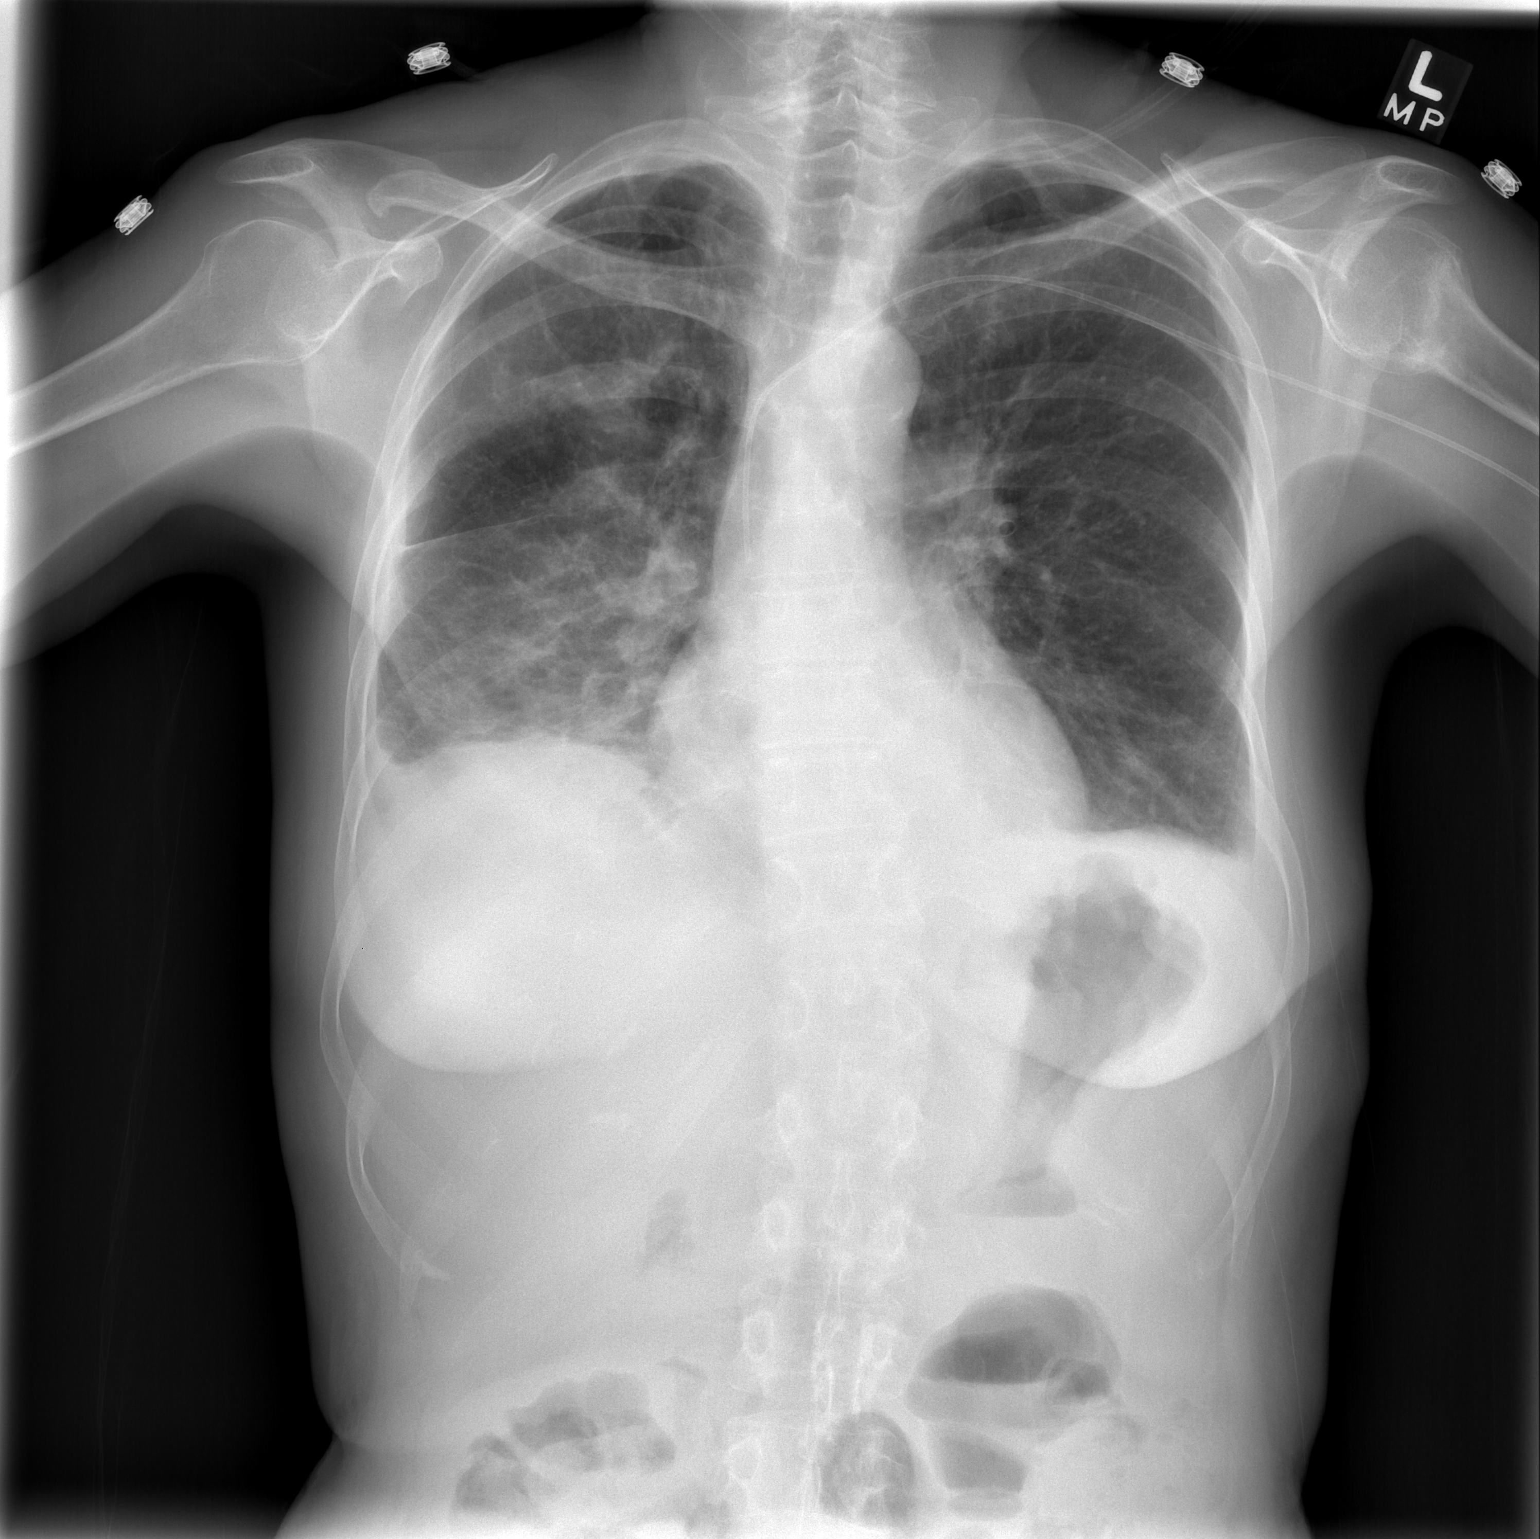

[w chest lat]
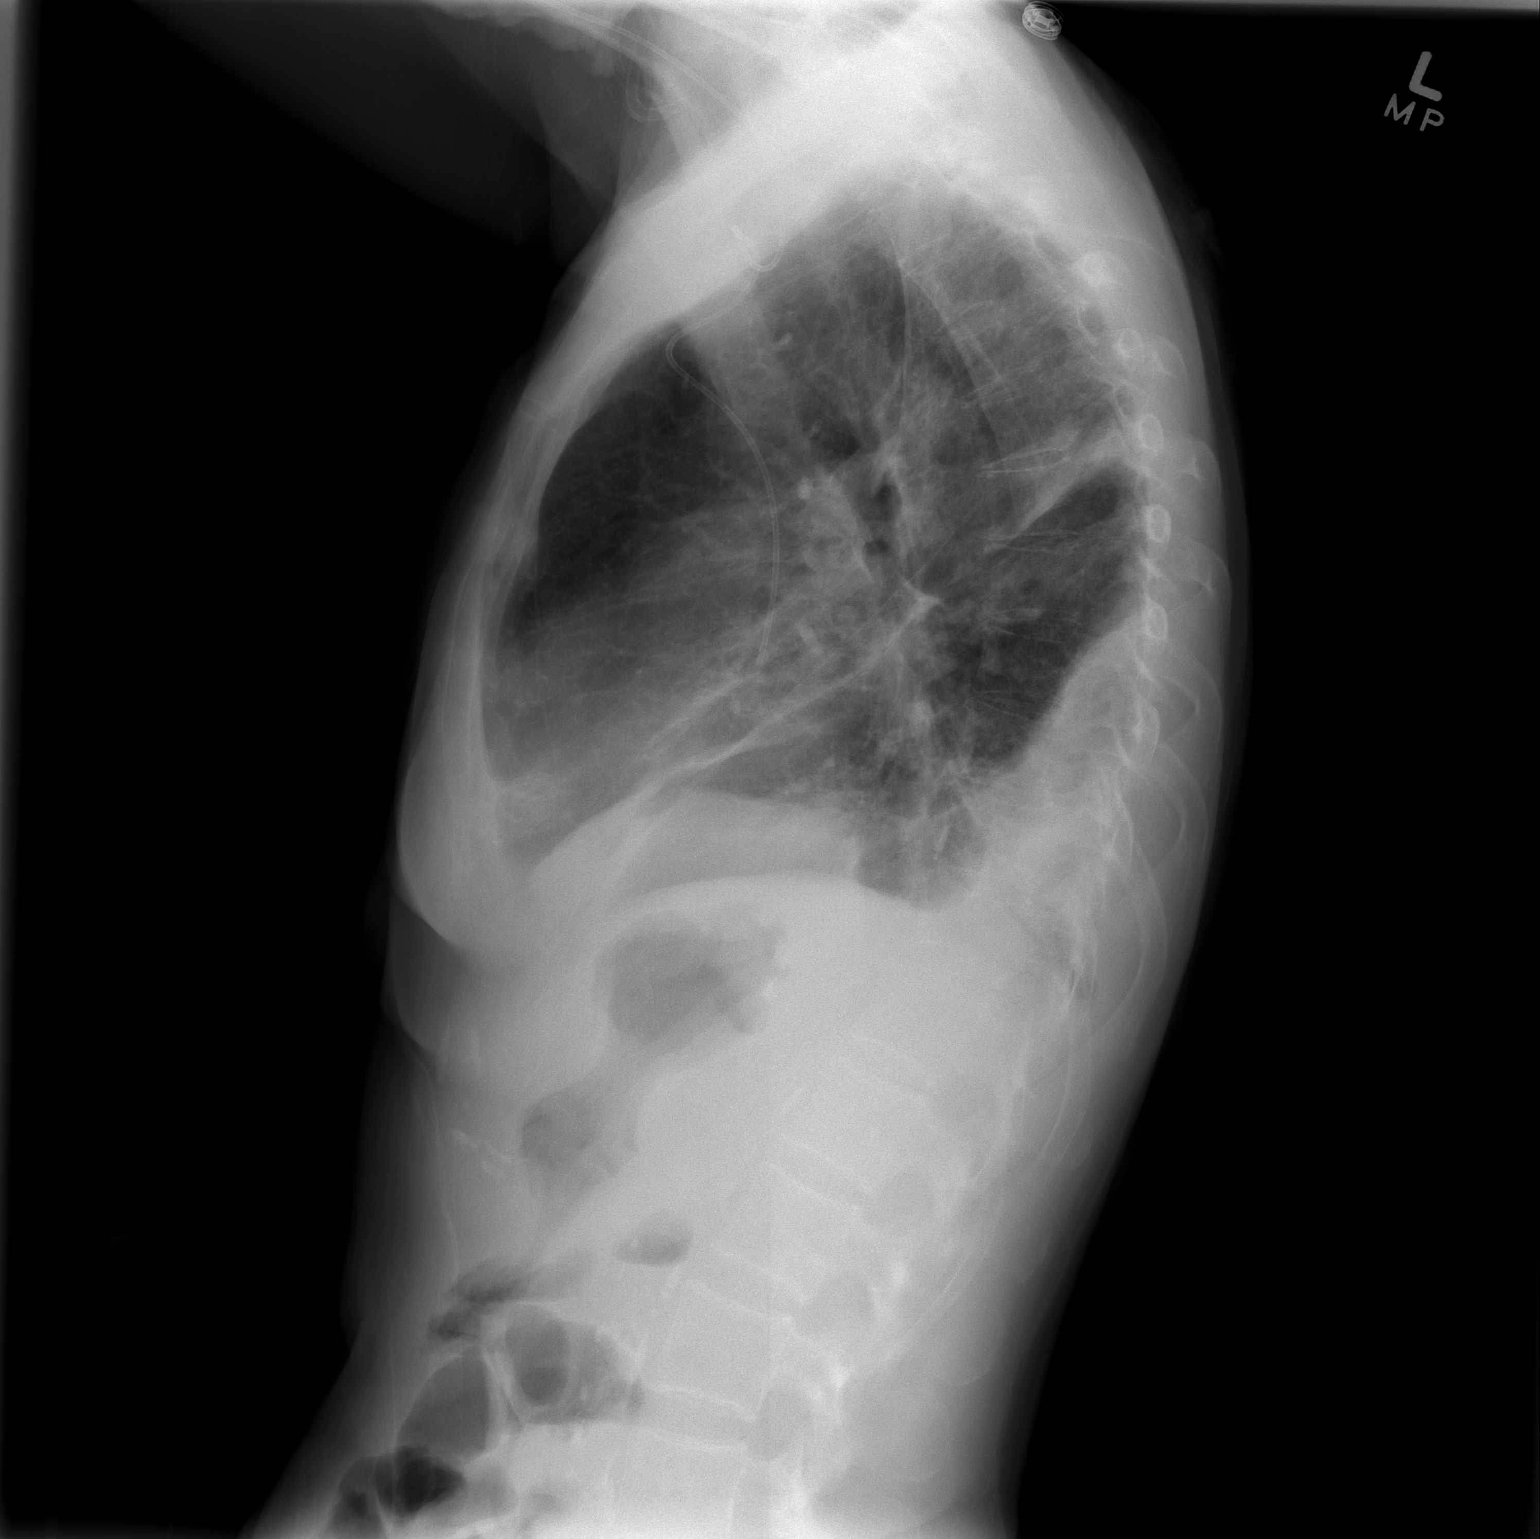

[2 of 2 positions shown; findings below may reference images not displayed]

FINDINGS: Patchy airspace disease involving the right upper and
lower lobes has slightly increased.  Bilateral pleural effusions
right greater than left has slightly increased. Mild patchy
airspace disease is present in the right middle lobe or lingula
noted on the lateral view. Minimal left basilar atelectasis
persists.  The left PICC is stable.  The heart is normal in size.
No pneumothorax.
IMPRESSION: Increasing airspace disease in the right lung.

Increasing small bilateral pleural effusions.

No pneumothorax.

## 2008-06-14 IMAGING — CR DG CHEST 2V
2 series · 2 of 2 positions shown · non-contrast
Comparison: Chest radiographs [DATE] and 08/13/2007.

CLINICAL DATA: Follow up pneumonia.  Hypoglycemia and cough.

CHEST - 2 VIEW

[w chest pa]
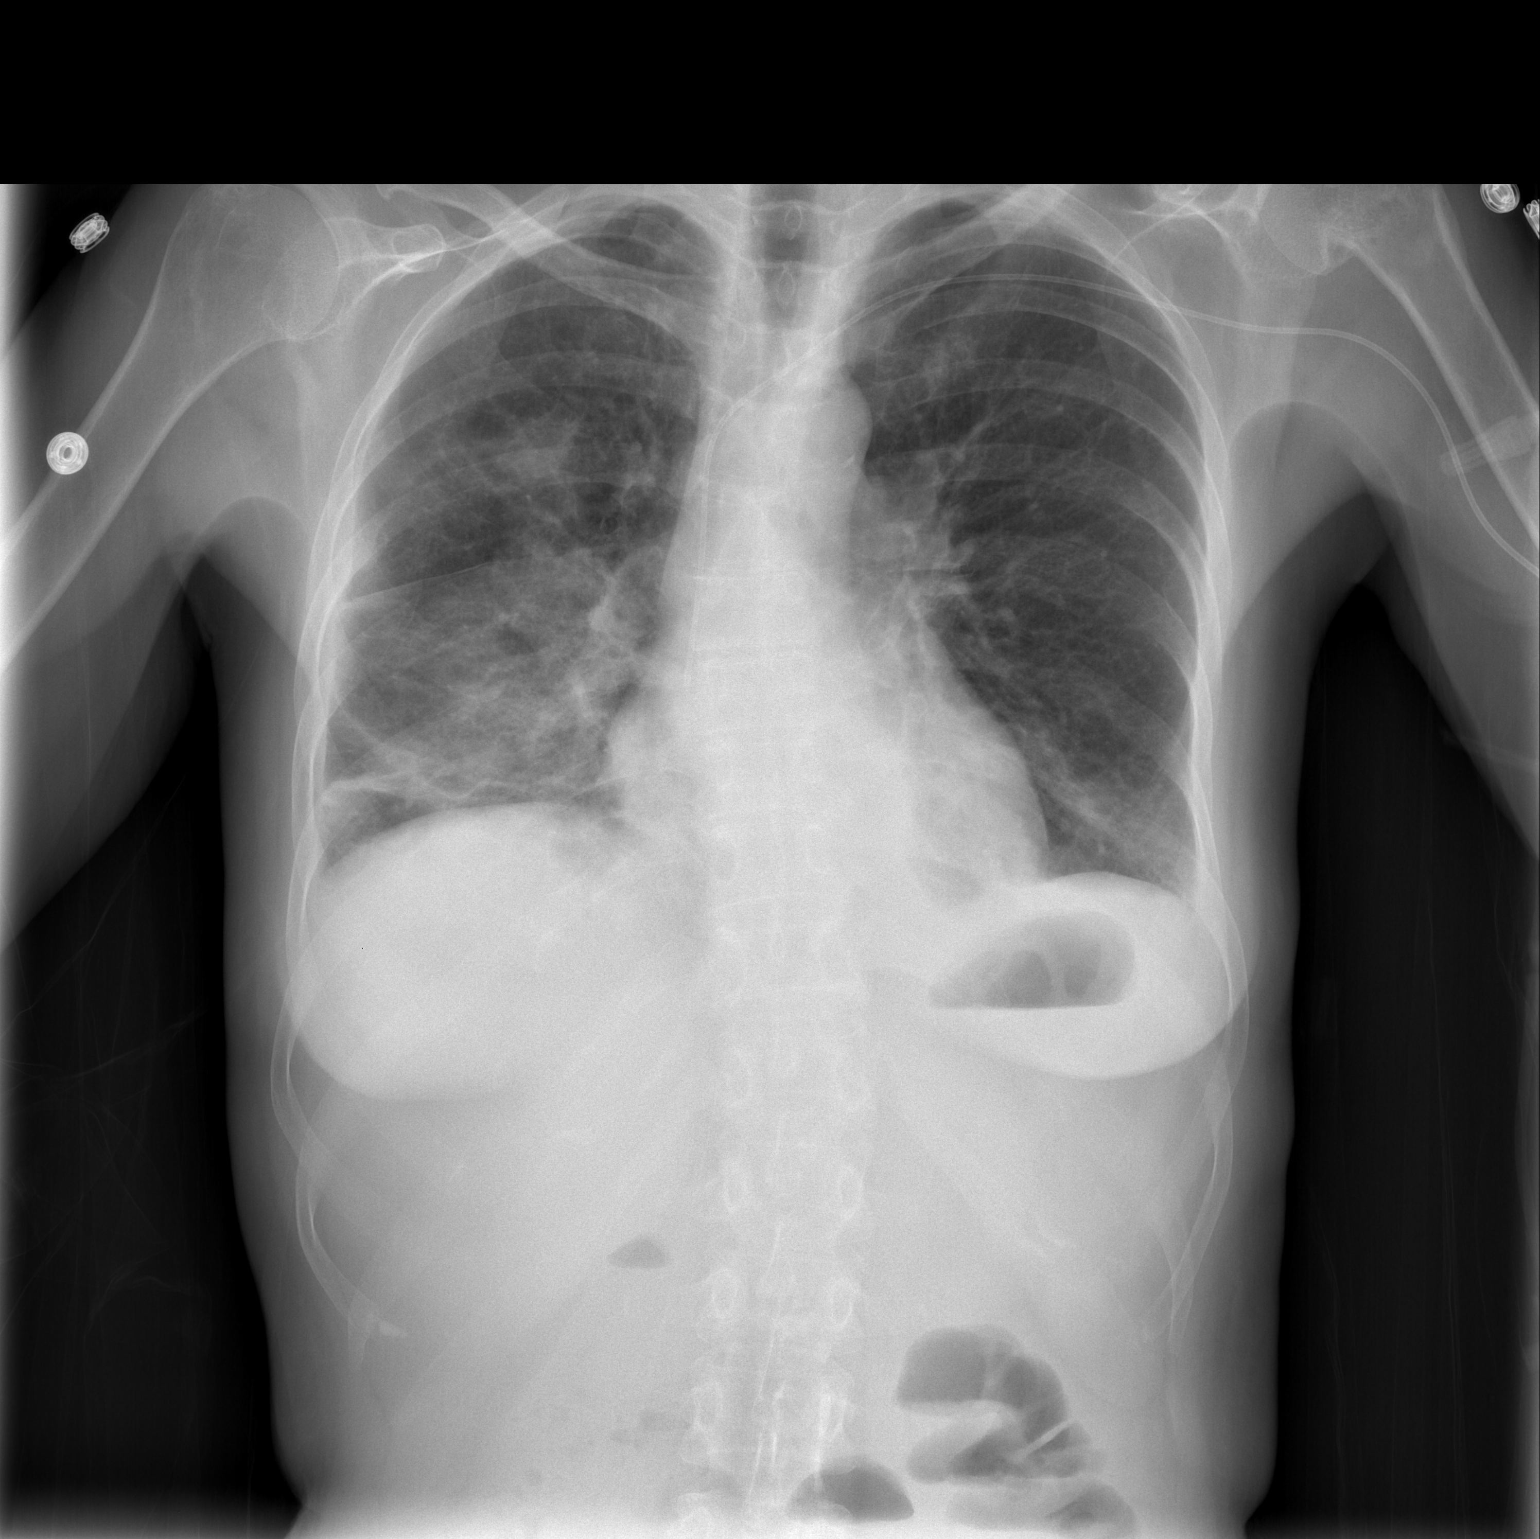

[w chest lat]
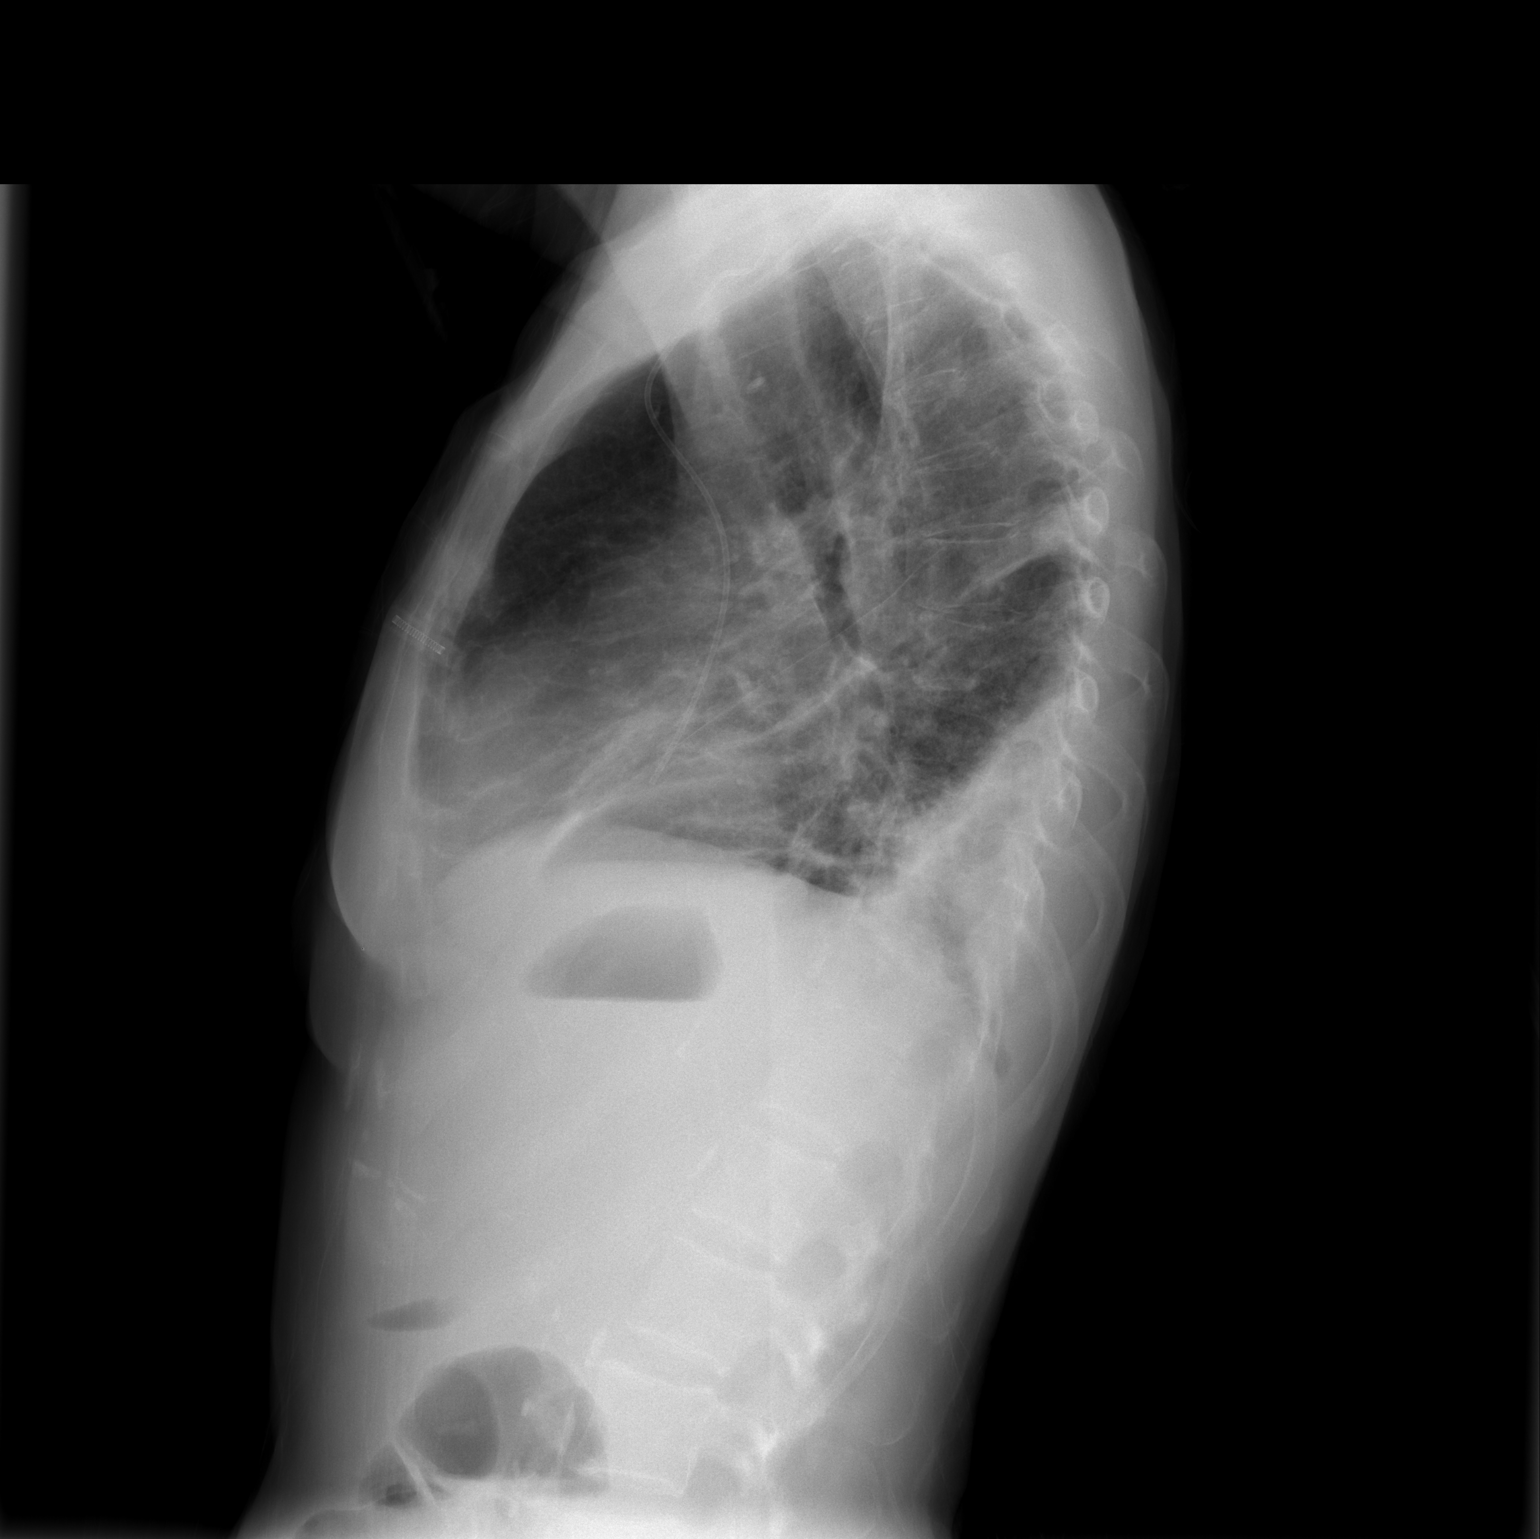

[2 of 2 positions shown; findings below may reference images not displayed]

FINDINGS: The left-sided PICC is unchanged with its tip near the
SVC right atrial junction.  The heart size and mediastinal contours
are stable.  The bilateral pleural effusions appear slightly
improved.  Right greater than left air space opacities are also
slightly improved.  There is no pneumothorax.  Post-traumatic
deformity of the proximal left humerus is noted.
IMPRESSION: Improving bilateral air space opacities and pleural effusions.

## 2008-06-17 ENCOUNTER — Telehealth (INDEPENDENT_AMBULATORY_CARE_PROVIDER_SITE_OTHER): Payer: Self-pay | Admitting: *Deleted

## 2008-06-17 ENCOUNTER — Encounter: Payer: Self-pay | Admitting: Family Medicine

## 2008-06-22 ENCOUNTER — Telehealth: Payer: Self-pay | Admitting: Family Medicine

## 2008-06-28 ENCOUNTER — Encounter: Payer: Self-pay | Admitting: Pulmonary Disease

## 2008-06-28 ENCOUNTER — Ambulatory Visit: Payer: Self-pay | Admitting: Pulmonary Disease

## 2008-06-28 DIAGNOSIS — J439 Emphysema, unspecified: Secondary | ICD-10-CM | POA: Insufficient documentation

## 2008-07-01 ENCOUNTER — Telehealth (INDEPENDENT_AMBULATORY_CARE_PROVIDER_SITE_OTHER): Payer: Self-pay | Admitting: *Deleted

## 2008-07-01 IMAGING — CR DG CHEST 2V
2 series · 2 of 2 positions shown · non-contrast
Comparison: Chest x-ray of 08/17/2007

CLINICAL DATA: Recent pneumonia, follow-up, cough and shortness of
breath

CHEST - 2 VIEW

[view not recorded (1 of 2)]
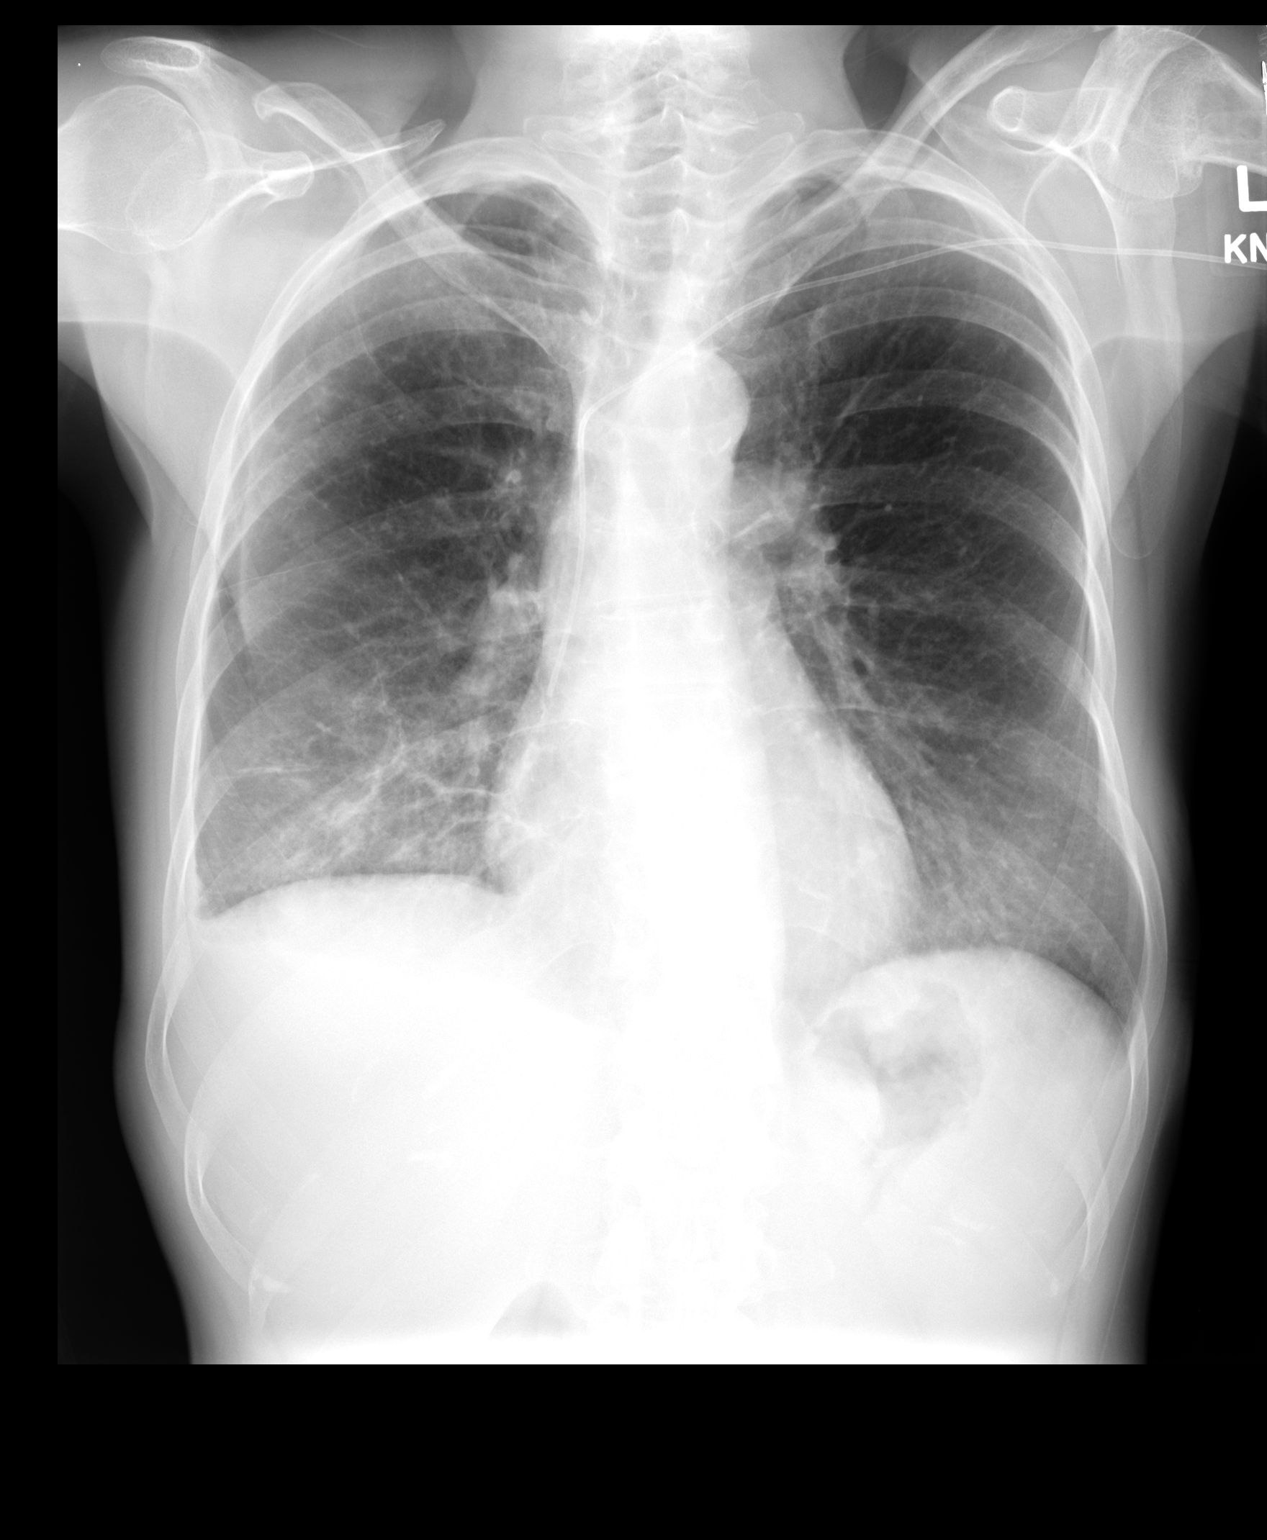

[view not recorded (2 of 2)]
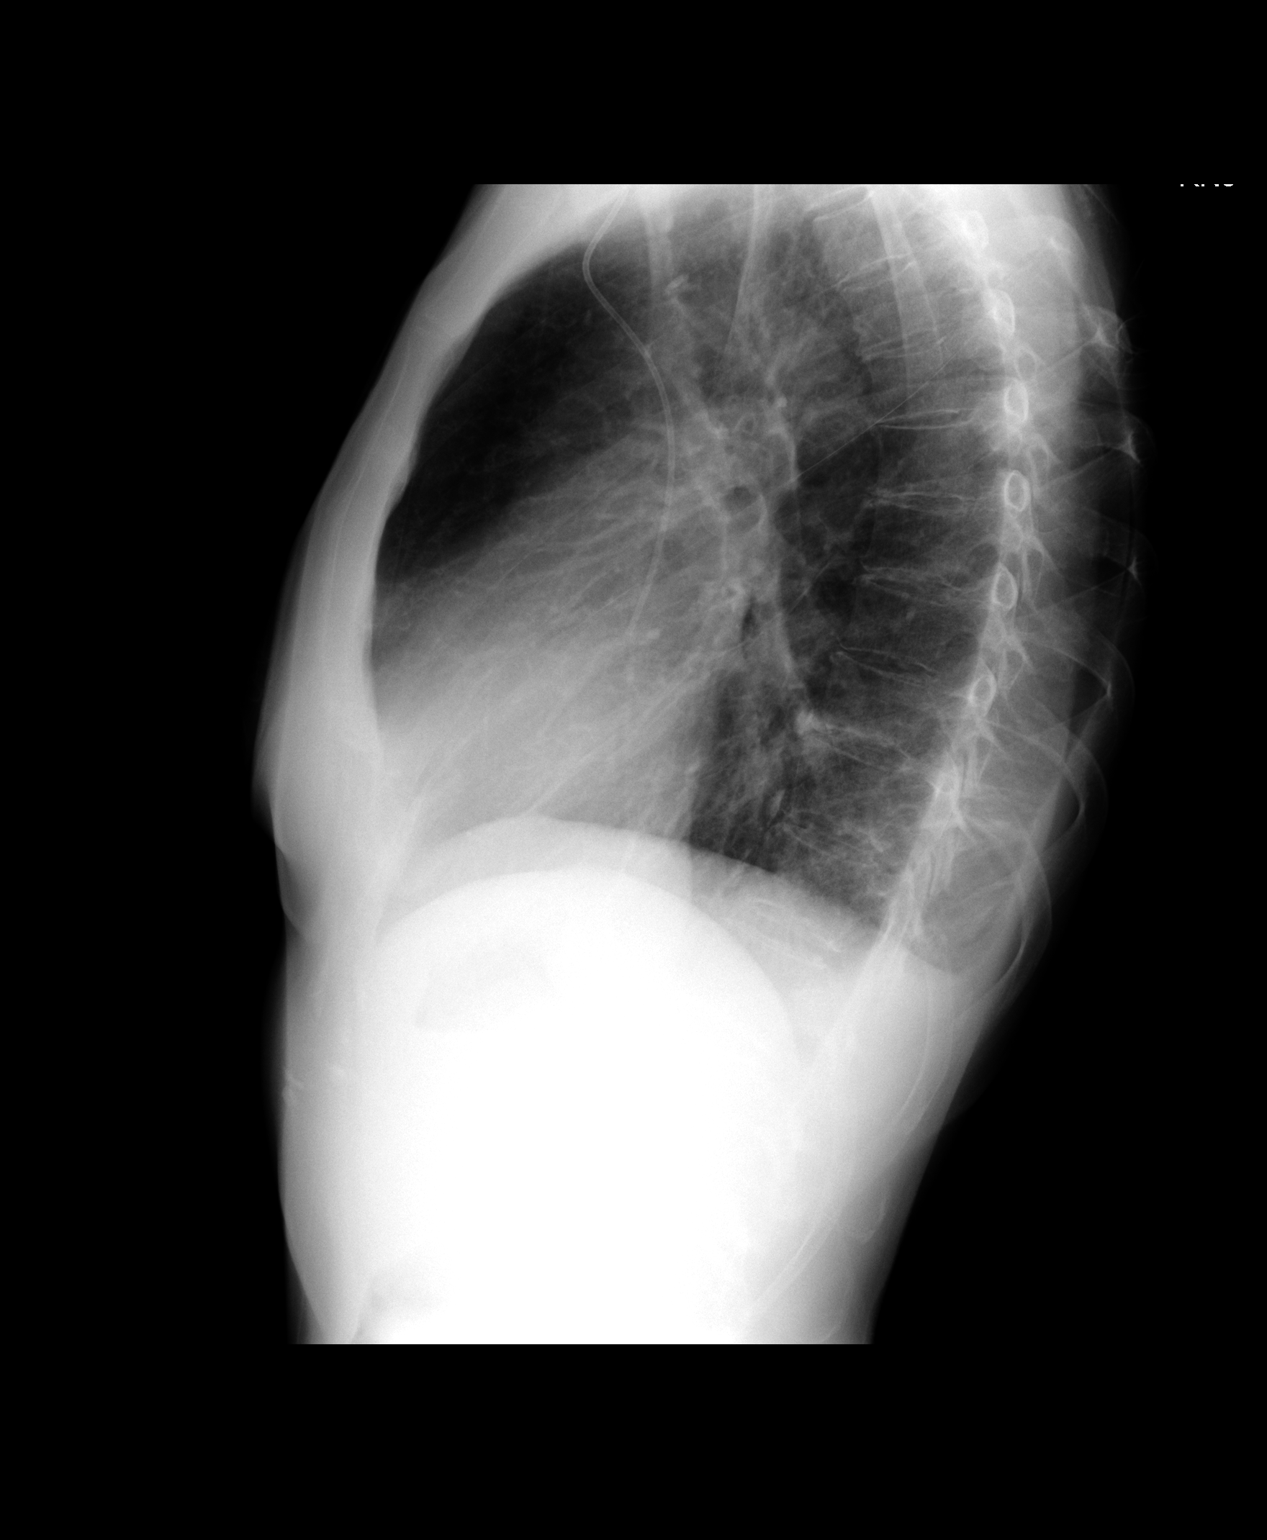

[2 of 2 positions shown; findings below may reference images not displayed]

FINDINGS: Aeration of the right lung base has improved.  Minimal
linear opacity remains at the right base.  The left lung is clear.
Left Port-A-Cath remains with the tip in the low SVC.  No
pneumothorax is seen.
IMPRESSION: Improved aeration of the right lung base with decreasing right
basilar opacity.  No change in PICC line tip in lower SVC.

## 2008-07-02 ENCOUNTER — Encounter: Payer: Self-pay | Admitting: Pulmonary Disease

## 2008-07-02 LAB — CONVERTED CEMR LAB
ALT: 18 units/L (ref 0–35)
AST: 25 units/L (ref 0–37)
Albumin: 3.6 g/dL (ref 3.5–5.2)
Alkaline Phosphatase: 94 units/L (ref 39–117)
Anti Nuclear Antibody(ANA): NEGATIVE
BUN: 16 mg/dL (ref 6–23)
Basophils Absolute: 0 10*3/uL (ref 0.0–0.1)
Basophils Relative: 0.1 % (ref 0.0–3.0)
Bilirubin, Direct: 0.1 mg/dL (ref 0.0–0.3)
CO2: 33 meq/L — ABNORMAL HIGH (ref 19–32)
Calcium: 8.7 mg/dL (ref 8.4–10.5)
Chloride: 102 meq/L (ref 96–112)
Creatinine, Ser: 0.8 mg/dL (ref 0.4–1.2)
Eosinophils Absolute: 0.2 10*3/uL (ref 0.0–0.7)
Eosinophils Relative: 2.5 % (ref 0.0–5.0)
GFR calc non Af Amer: 76.84 mL/min (ref >60)
Glucose, Bld: 86 mg/dL (ref 70–99)
HCT: 37.8 % (ref 36.0–46.0)
Hemoglobin: 12.9 g/dL (ref 12.0–15.0)
Lymphocytes Relative: 15.7 % (ref 12.0–46.0)
Lymphs Abs: 1 10*3/uL (ref 0.7–4.0)
MCHC: 34.1 g/dL (ref 30.0–36.0)
MCV: 87.3 fL (ref 78.0–100.0)
Monocytes Absolute: 0.4 10*3/uL (ref 0.1–1.0)
Monocytes Relative: 6.9 % (ref 3.0–12.0)
Neutro Abs: 4.6 10*3/uL (ref 1.4–7.7)
Neutrophils Relative %: 74.8 % (ref 43.0–77.0)
Platelets: 324 10*3/uL (ref 150.0–400.0)
Potassium: 4.4 meq/L (ref 3.5–5.1)
RBC: 4.33 M/uL (ref 3.87–5.11)
RDW: 13.3 % (ref 11.5–14.6)
Rhuematoid fact SerPl-aCnc: <20 intl units/mL (ref 0.0–20.0)
Sed Rate: 14 mm/hr (ref 0–22)
Sodium: 141 meq/L (ref 135–145)
Total Bilirubin: 0.4 mg/dL (ref 0.3–1.2)
Total Protein: 6.8 g/dL (ref 6.0–8.3)
WBC: 6.2 10*3/uL (ref 4.5–10.5)

## 2008-07-05 ENCOUNTER — Ambulatory Visit: Payer: Self-pay | Admitting: Internal Medicine

## 2008-07-12 ENCOUNTER — Telehealth (INDEPENDENT_AMBULATORY_CARE_PROVIDER_SITE_OTHER): Payer: Self-pay | Admitting: *Deleted

## 2008-07-13 ENCOUNTER — Telehealth (INDEPENDENT_AMBULATORY_CARE_PROVIDER_SITE_OTHER): Payer: Self-pay | Admitting: *Deleted

## 2008-07-26 ENCOUNTER — Telehealth (INDEPENDENT_AMBULATORY_CARE_PROVIDER_SITE_OTHER): Payer: Self-pay | Admitting: *Deleted

## 2008-07-29 ENCOUNTER — Ambulatory Visit: Payer: Self-pay | Admitting: Pulmonary Disease

## 2008-07-30 ENCOUNTER — Ambulatory Visit: Payer: Self-pay | Admitting: Hematology & Oncology

## 2008-08-02 ENCOUNTER — Encounter: Payer: Self-pay | Admitting: Internal Medicine

## 2008-08-02 LAB — CBC WITH DIFFERENTIAL (CANCER CENTER ONLY)
BASO#: 0 10*3/uL (ref 0.0–0.2)
BASO%: 0.5 % (ref 0.0–2.0)
EOS%: 2.2 % (ref 0.0–7.0)
Eosinophils Absolute: 0.1 10*3/uL (ref 0.0–0.5)
HCT: 30.5 % — ABNORMAL LOW (ref 34.8–46.6)
HGB: 10.4 g/dL — ABNORMAL LOW (ref 11.6–15.9)
LYMPH#: 0.9 10*3/uL (ref 0.9–3.3)
LYMPH%: 15.6 % (ref 14.0–48.0)
MCH: 29.4 pg (ref 26.0–34.0)
MCHC: 34.2 g/dL (ref 32.0–36.0)
MCV: 86 fL (ref 81–101)
MONO#: 0.3 10*3/uL (ref 0.1–0.9)
MONO%: 5.6 % (ref 0.0–13.0)
NEUT#: 4.4 10*3/uL (ref 1.5–6.5)
NEUT%: 76.1 % (ref 39.6–80.0)
Platelets: 234 10*3/uL (ref 145–400)
RBC: 3.54 10*6/uL — ABNORMAL LOW (ref 3.70–5.32)
RDW: 13.4 % (ref 10.5–14.6)
WBC: 5.7 10*3/uL (ref 3.9–10.0)

## 2008-08-02 LAB — CHCC SATELLITE - SMEAR

## 2008-08-03 ENCOUNTER — Ambulatory Visit: Payer: Self-pay | Admitting: Family Medicine

## 2008-08-04 ENCOUNTER — Ambulatory Visit: Payer: Self-pay | Admitting: Family Medicine

## 2008-08-04 LAB — CONVERTED CEMR LAB
Bilirubin Urine: NEGATIVE
Blood in Urine, dipstick: NEGATIVE
Glucose, Urine, Semiquant: NEGATIVE
Ketones, urine, test strip: NEGATIVE
Nitrite: NEGATIVE
Protein, U semiquant: NEGATIVE
Specific Gravity, Urine: <1.005
Urobilinogen, UA: 0.2
pH: 6

## 2008-08-04 LAB — RETICULOCYTES (CHCC)
ABS Retic: 26.5 10*3/uL (ref 19.0–186.0)
RBC.: 3.79 MIL/uL — ABNORMAL LOW (ref 3.87–5.11)
Retic Ct Pct: 0.7 % (ref 0.4–3.1)

## 2008-08-04 LAB — IRON AND TIBC
%SAT: 31 % (ref 20–55)
Iron: 76 ug/dL (ref 42–145)
TIBC: 245 ug/dL — ABNORMAL LOW (ref 250–470)
UIBC: 169 ug/dL

## 2008-08-04 LAB — TRANSFERRIN RECEPTOR, SOLUABLE: Transferrin Receptor, Soluble: 10.3 nmol/L

## 2008-08-04 LAB — FERRITIN: Ferritin: 404 ng/mL — ABNORMAL HIGH (ref 10–291)

## 2008-08-05 ENCOUNTER — Encounter: Payer: Self-pay | Admitting: Family Medicine

## 2008-08-05 ENCOUNTER — Ambulatory Visit: Payer: Self-pay | Admitting: Internal Medicine

## 2008-08-09 ENCOUNTER — Telehealth (INDEPENDENT_AMBULATORY_CARE_PROVIDER_SITE_OTHER): Payer: Self-pay | Admitting: *Deleted

## 2008-08-23 ENCOUNTER — Telehealth: Payer: Self-pay | Admitting: Pulmonary Disease

## 2008-08-27 ENCOUNTER — Ambulatory Visit: Payer: Self-pay | Admitting: Internal Medicine

## 2008-09-01 ENCOUNTER — Ambulatory Visit: Payer: Self-pay | Admitting: Pulmonary Disease

## 2008-09-01 ENCOUNTER — Ambulatory Visit: Payer: Self-pay | Admitting: Cardiology

## 2008-09-01 LAB — CONVERTED CEMR LAB
BUN: 9 mg/dL (ref 6–23)
Basophils Absolute: 0 10*3/uL (ref 0.0–0.1)
Basophils Relative: 0.2 % (ref 0.0–3.0)
CO2: 28 meq/L (ref 19–32)
Calcium: 7.6 mg/dL — ABNORMAL LOW (ref 8.4–10.5)
Chloride: 102 meq/L (ref 96–112)
Creatinine, Ser: 0.7 mg/dL (ref 0.4–1.2)
Eosinophils Absolute: 0.1 10*3/uL (ref 0.0–0.7)
Eosinophils Relative: 1.3 % (ref 0.0–5.0)
GFR calc non Af Amer: 89.59 mL/min (ref >60)
Glucose, Bld: 104 mg/dL — ABNORMAL HIGH (ref 70–99)
HCT: 32 % — ABNORMAL LOW (ref 36.0–46.0)
Hemoglobin: 10.6 g/dL — ABNORMAL LOW (ref 12.0–15.0)
Lymphocytes Relative: 5.4 % — ABNORMAL LOW (ref 12.0–46.0)
Lymphs Abs: 0.5 10*3/uL — ABNORMAL LOW (ref 0.7–4.0)
MCHC: 33.1 g/dL (ref 30.0–36.0)
MCV: 88.8 fL (ref 78.0–100.0)
Monocytes Absolute: 0.3 10*3/uL (ref 0.1–1.0)
Monocytes Relative: 2.7 % — ABNORMAL LOW (ref 3.0–12.0)
Neutro Abs: 8.5 10*3/uL — ABNORMAL HIGH (ref 1.4–7.7)
Neutrophils Relative %: 90.4 % — ABNORMAL HIGH (ref 43.0–77.0)
Platelets: 331 10*3/uL (ref 150.0–400.0)
Potassium: 4.1 meq/L (ref 3.5–5.1)
RBC: 3.6 M/uL — ABNORMAL LOW (ref 3.87–5.11)
RDW: 12.8 % (ref 11.5–14.6)
Sodium: 143 meq/L (ref 135–145)
WBC: 9.4 10*3/uL (ref 4.5–10.5)

## 2008-09-02 ENCOUNTER — Telehealth (INDEPENDENT_AMBULATORY_CARE_PROVIDER_SITE_OTHER): Payer: Self-pay | Admitting: *Deleted

## 2008-09-03 ENCOUNTER — Ambulatory Visit: Payer: Self-pay | Admitting: Internal Medicine

## 2008-09-03 DIAGNOSIS — R932 Abnormal findings on diagnostic imaging of liver and biliary tract: Secondary | ICD-10-CM | POA: Insufficient documentation

## 2008-09-06 LAB — CONVERTED CEMR LAB
ALT: 15 units/L (ref 0–35)
AST: 17 units/L (ref 0–37)
Albumin: 2.9 g/dL — ABNORMAL LOW (ref 3.5–5.2)
Alkaline Phosphatase: 85 units/L (ref 39–117)
Amylase: 63 units/L (ref 27–131)
Bilirubin, Direct: 0.1 mg/dL (ref 0.0–0.3)
Lipase: 28 units/L (ref 11.0–59.0)
Total Bilirubin: 0.5 mg/dL (ref 0.3–1.2)
Total Protein: 6.7 g/dL (ref 6.0–8.3)

## 2008-09-07 ENCOUNTER — Ambulatory Visit (HOSPITAL_COMMUNITY): Admission: RE | Admit: 2008-09-07 | Discharge: 2008-09-07 | Payer: Self-pay | Admitting: Internal Medicine

## 2008-09-08 ENCOUNTER — Ambulatory Visit: Payer: Self-pay | Admitting: Pulmonary Disease

## 2008-09-08 ENCOUNTER — Telehealth (INDEPENDENT_AMBULATORY_CARE_PROVIDER_SITE_OTHER): Payer: Self-pay | Admitting: *Deleted

## 2008-09-09 ENCOUNTER — Ambulatory Visit: Payer: Self-pay | Admitting: Internal Medicine

## 2008-09-16 ENCOUNTER — Ambulatory Visit: Payer: Self-pay | Admitting: Pulmonary Disease

## 2008-09-16 ENCOUNTER — Ambulatory Visit (HOSPITAL_COMMUNITY): Admission: RE | Admit: 2008-09-16 | Discharge: 2008-09-16 | Payer: Self-pay | Admitting: Pulmonary Disease

## 2008-10-04 ENCOUNTER — Telehealth (INDEPENDENT_AMBULATORY_CARE_PROVIDER_SITE_OTHER): Payer: Self-pay | Admitting: *Deleted

## 2008-10-06 ENCOUNTER — Telehealth (INDEPENDENT_AMBULATORY_CARE_PROVIDER_SITE_OTHER): Payer: Self-pay | Admitting: *Deleted

## 2008-10-12 ENCOUNTER — Ambulatory Visit: Payer: Self-pay | Admitting: Internal Medicine

## 2008-10-25 ENCOUNTER — Encounter: Payer: Self-pay | Admitting: Family Medicine

## 2008-11-03 ENCOUNTER — Ambulatory Visit: Payer: Self-pay | Admitting: Hematology & Oncology

## 2008-11-04 ENCOUNTER — Encounter: Payer: Self-pay | Admitting: Internal Medicine

## 2008-11-04 LAB — CBC WITH DIFFERENTIAL (CANCER CENTER ONLY)
BASO#: 0 10*3/uL (ref 0.0–0.2)
BASO%: 0.4 % (ref 0.0–2.0)
EOS%: 2.2 % (ref 0.0–7.0)
Eosinophils Absolute: 0.2 10*3/uL (ref 0.0–0.5)
HCT: 32.3 % — ABNORMAL LOW (ref 34.8–46.6)
HGB: 10.9 g/dL — ABNORMAL LOW (ref 11.6–15.9)
LYMPH#: 1.3 10*3/uL (ref 0.9–3.3)
LYMPH%: 14.2 % (ref 14.0–48.0)
MCH: 28.5 pg (ref 26.0–34.0)
MCHC: 33.7 g/dL (ref 32.0–36.0)
MCV: 84 fL (ref 81–101)
MONO#: 0.4 10*3/uL (ref 0.1–0.9)
MONO%: 4.6 % (ref 0.0–13.0)
NEUT#: 7.2 10*3/uL — ABNORMAL HIGH (ref 1.5–6.5)
NEUT%: 78.6 % (ref 39.6–80.0)
Platelets: 404 10*3/uL — ABNORMAL HIGH (ref 145–400)
RBC: 3.82 10*6/uL (ref 3.70–5.32)
RDW: 12.7 % (ref 10.5–14.6)
WBC: 9.1 10*3/uL (ref 3.9–10.0)

## 2008-11-04 LAB — CHCC SATELLITE - SMEAR

## 2008-11-06 LAB — FERRITIN: Ferritin: 483 ng/mL — ABNORMAL HIGH (ref 10–291)

## 2008-11-06 LAB — RETICULOCYTES (CHCC)
ABS Retic: 50.4 10*3/uL (ref 19.0–186.0)
RBC.: 3.88 MIL/uL (ref 3.87–5.11)
Retic Ct Pct: 1.3 % (ref 0.4–3.1)

## 2008-11-06 LAB — VITAMIN D 25 HYDROXY (VIT D DEFICIENCY, FRACTURES): Vit D, 25-Hydroxy: 17 ng/mL — ABNORMAL LOW (ref 30–89)

## 2008-11-06 LAB — TRANSFERRIN RECEPTOR, SOLUABLE: Transferrin Receptor, Soluble: 15.7 nmol/L

## 2008-11-11 ENCOUNTER — Encounter (INDEPENDENT_AMBULATORY_CARE_PROVIDER_SITE_OTHER): Payer: Self-pay | Admitting: Urology

## 2008-11-11 ENCOUNTER — Telehealth (INDEPENDENT_AMBULATORY_CARE_PROVIDER_SITE_OTHER): Payer: Self-pay | Admitting: *Deleted

## 2008-11-11 ENCOUNTER — Ambulatory Visit (HOSPITAL_BASED_OUTPATIENT_CLINIC_OR_DEPARTMENT_OTHER): Admission: RE | Admit: 2008-11-11 | Discharge: 2008-11-11 | Payer: Self-pay | Admitting: Urology

## 2008-11-11 HISTORY — PX: CYSTOSCOPY: SUR368

## 2008-11-11 HISTORY — PX: CYSTOSCOPY W/ RETROGRADES: SHX1426

## 2008-11-15 ENCOUNTER — Ambulatory Visit: Payer: Self-pay | Admitting: Internal Medicine

## 2008-11-15 ENCOUNTER — Encounter: Payer: Self-pay | Admitting: Family Medicine

## 2008-11-16 ENCOUNTER — Ambulatory Visit: Payer: Self-pay | Admitting: Family Medicine

## 2008-11-17 ENCOUNTER — Encounter: Payer: Self-pay | Admitting: Family Medicine

## 2008-11-22 LAB — CONVERTED CEMR LAB
ALT: 15 units/L (ref 0–35)
AST: 27 units/L (ref 0–37)
Albumin: 3.6 g/dL (ref 3.5–5.2)
Alkaline Phosphatase: 77 units/L (ref 39–117)
Basophils Absolute: 0.1 10*3/uL (ref 0.0–0.1)
Basophils Relative: 1 % (ref 0–1)
Bilirubin, Direct: <0.1 mg/dL (ref 0.0–0.3)
Eosinophils Absolute: 0.1 10*3/uL (ref 0.0–0.7)
Eosinophils Relative: 2 % (ref 0–5)
HCT: 33 % — ABNORMAL LOW (ref 36.0–46.0)
Hemoglobin: 10.2 g/dL — ABNORMAL LOW (ref 12.0–15.0)
INR: 1 (ref 0.0–1.5)
Lymphocytes Relative: 17 % (ref 12–46)
Lymphs Abs: 1 10*3/uL (ref 0.7–4.0)
MCHC: 30.9 g/dL (ref 30.0–36.0)
MCV: 87.5 fL (ref 78.0–100.0)
Monocytes Absolute: 0.4 10*3/uL (ref 0.1–1.0)
Monocytes Relative: 6 % (ref 3–12)
Neutro Abs: 4.3 10*3/uL (ref 1.7–7.7)
Neutrophils Relative %: 74 % (ref 43–77)
Platelets: 355 10*3/uL (ref 150–400)
Prothrombin Time: 13.3 s (ref 11.6–15.2)
RBC: 3.77 M/uL — ABNORMAL LOW (ref 3.87–5.11)
RDW: 14.2 % (ref 11.5–15.5)
Total Bilirubin: 0.2 mg/dL — ABNORMAL LOW (ref 0.3–1.2)
Total Protein: 6.3 g/dL (ref 6.0–8.3)
WBC: 5.8 10*3/uL (ref 4.0–10.5)
aPTT: 30 s (ref 24–37)

## 2008-11-24 ENCOUNTER — Telehealth (INDEPENDENT_AMBULATORY_CARE_PROVIDER_SITE_OTHER): Payer: Self-pay | Admitting: *Deleted

## 2008-11-25 ENCOUNTER — Telehealth (INDEPENDENT_AMBULATORY_CARE_PROVIDER_SITE_OTHER): Payer: Self-pay | Admitting: *Deleted

## 2008-11-26 ENCOUNTER — Encounter: Payer: Self-pay | Admitting: Family Medicine

## 2008-11-30 ENCOUNTER — Ambulatory Visit: Payer: Self-pay | Admitting: Pulmonary Disease

## 2008-12-13 ENCOUNTER — Telehealth (INDEPENDENT_AMBULATORY_CARE_PROVIDER_SITE_OTHER): Payer: Self-pay | Admitting: *Deleted

## 2008-12-16 ENCOUNTER — Ambulatory Visit: Payer: Self-pay | Admitting: Internal Medicine

## 2008-12-16 DIAGNOSIS — E538 Deficiency of other specified B group vitamins: Secondary | ICD-10-CM | POA: Insufficient documentation

## 2008-12-21 ENCOUNTER — Ambulatory Visit: Payer: Self-pay | Admitting: Hematology & Oncology

## 2008-12-23 ENCOUNTER — Encounter: Payer: Self-pay | Admitting: Family Medicine

## 2008-12-23 ENCOUNTER — Encounter: Payer: Self-pay | Admitting: Internal Medicine

## 2008-12-23 LAB — CBC WITH DIFFERENTIAL (CANCER CENTER ONLY)
BASO#: 0.1 10*3/uL (ref 0.0–0.2)
BASO%: 0.6 % (ref 0.0–2.0)
EOS%: 2.1 % (ref 0.0–7.0)
Eosinophils Absolute: 0.2 10*3/uL (ref 0.0–0.5)
HCT: 33.3 % — ABNORMAL LOW (ref 34.8–46.6)
HGB: 11.2 g/dL — ABNORMAL LOW (ref 11.6–15.9)
LYMPH#: 1 10*3/uL (ref 0.9–3.3)
LYMPH%: 9.4 % — ABNORMAL LOW (ref 14.0–48.0)
MCH: 28.2 pg (ref 26.0–34.0)
MCHC: 33.6 g/dL (ref 32.0–36.0)
MCV: 84 fL (ref 81–101)
MONO#: 0.5 10*3/uL (ref 0.1–0.9)
MONO%: 5.2 % (ref 0.0–13.0)
NEUT#: 8.5 10*3/uL — ABNORMAL HIGH (ref 1.5–6.5)
NEUT%: 82.7 % — ABNORMAL HIGH (ref 39.6–80.0)
Platelets: 340 10*3/uL (ref 145–400)
RBC: 3.97 10*6/uL (ref 3.70–5.32)
RDW: 13.3 % (ref 10.5–14.6)
WBC: 10.3 10*3/uL — ABNORMAL HIGH (ref 3.9–10.0)

## 2008-12-23 LAB — FERRITIN: Ferritin: 395 ng/mL — ABNORMAL HIGH (ref 10–291)

## 2008-12-23 LAB — RETICULOCYTES (CHCC)
ABS Retic: 29.2 10*3/uL (ref 19.0–186.0)
RBC.: 4.17 MIL/uL (ref 3.87–5.11)
Retic Ct Pct: 0.7 % (ref 0.4–3.1)

## 2008-12-23 LAB — CHCC SATELLITE - SMEAR

## 2008-12-28 ENCOUNTER — Ambulatory Visit: Payer: Self-pay | Admitting: Family Medicine

## 2008-12-29 IMAGING — CR DG CHEST 2V
2 series · 2 of 2 positions shown · non-contrast
Comparison: 09/03/2007.

CLINICAL DATA: History given of coughing, shortness of breath,
chest pain.  History of hypertension and previous tobacco smoking.

CHEST - 2 VIEW

[view not recorded (1 of 2)]
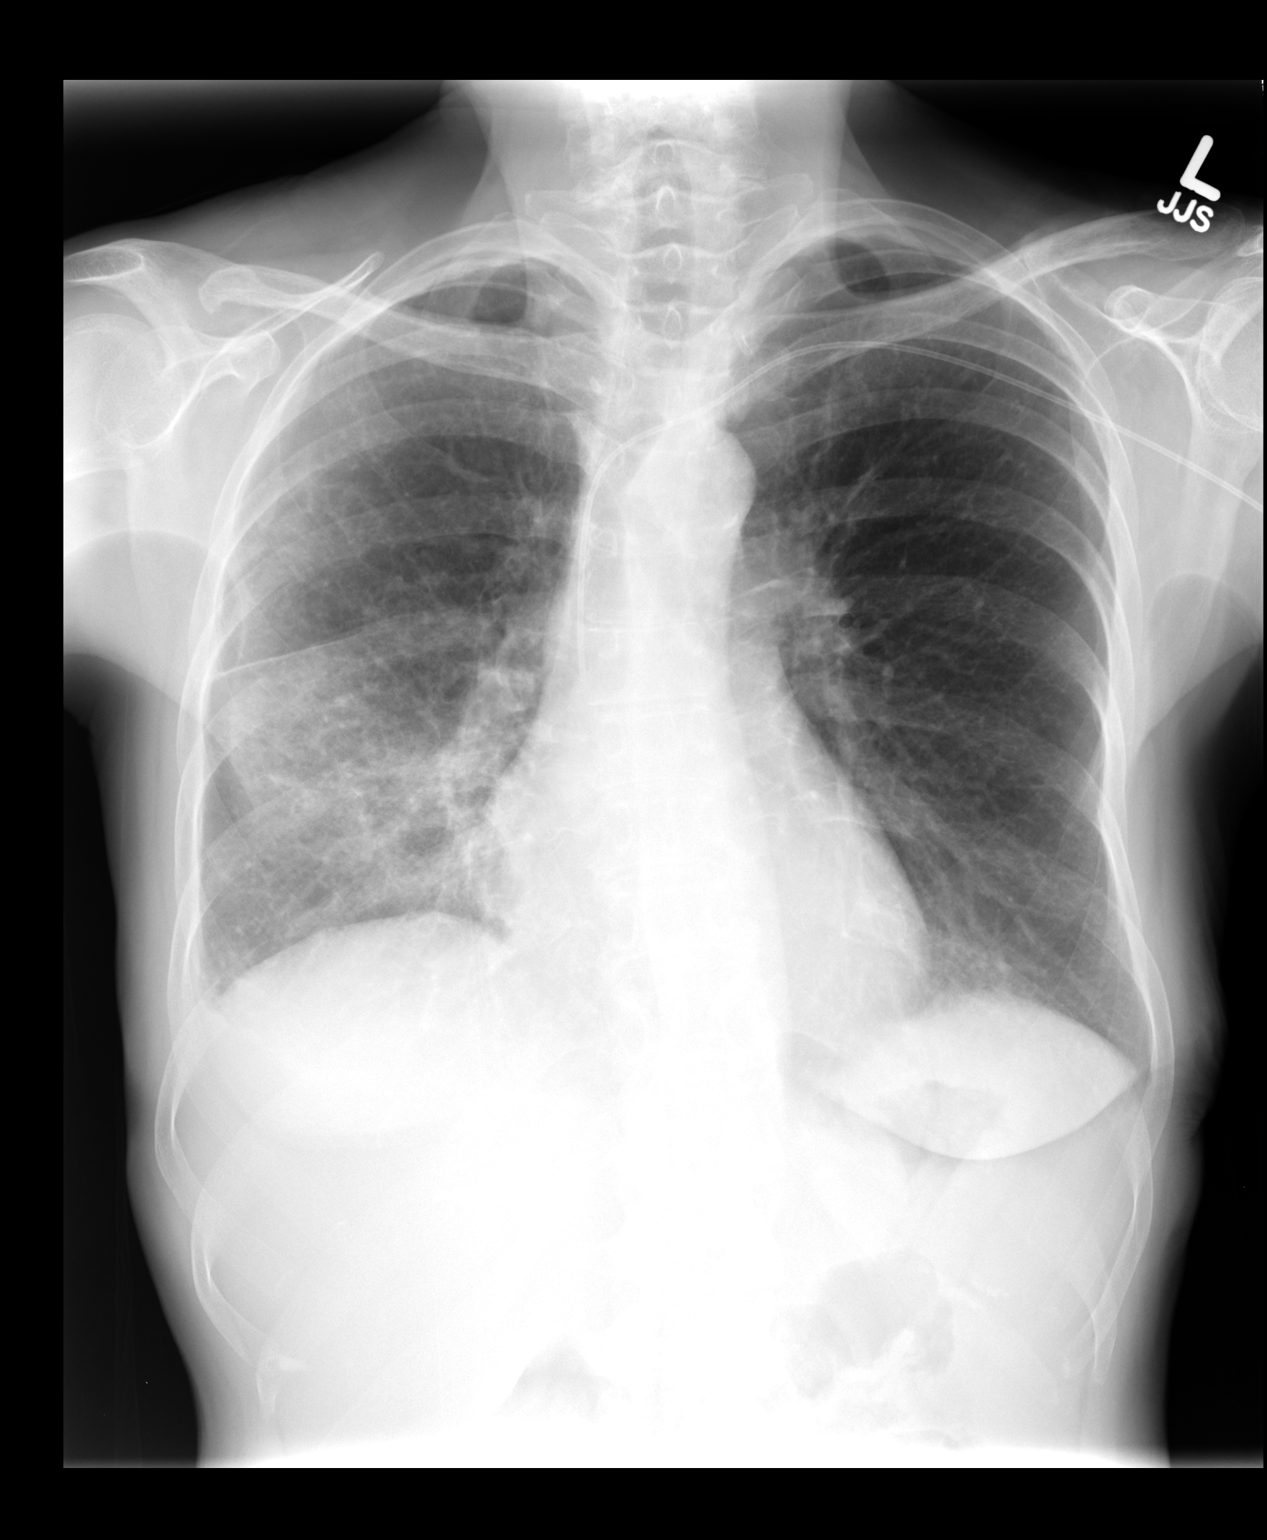

[view not recorded (2 of 2)]
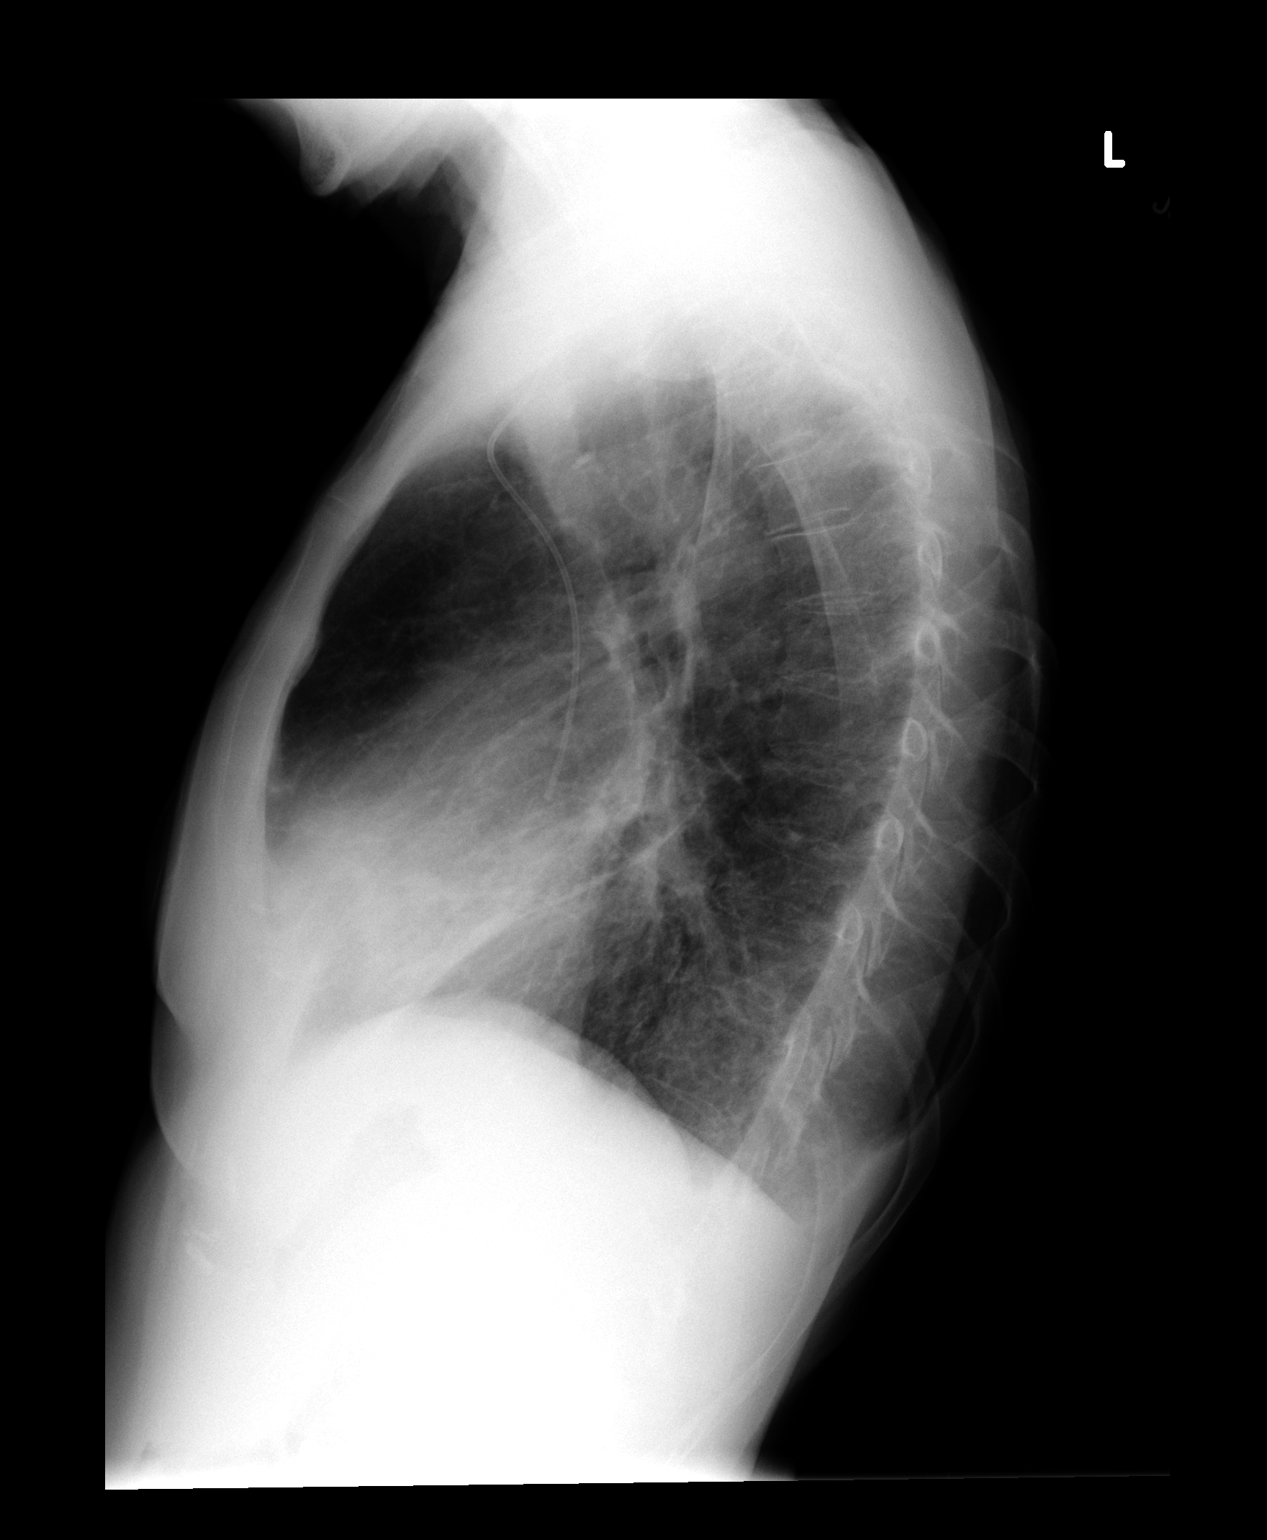

[2 of 2 positions shown; findings below may reference images not displayed]

FINDINGS: The cardiac silhouette is normal size and shape.
Nonaneurysmal aortic calcification is present.  PICC enters from
left sided approach with tip in superior vena cava.  No
pneumothorax is evident.  Left lung is free of infiltrates.  No
pleural effusion is seen on the left. There is a mild
hyperinflation configuration with slight flattening of the
diaphragm on lateral image.

Infiltrative density has increased within the right lung mid and
lower portions since the previous study of 09/03/2007.  This is in
the right middle lobe.  I am concerned this reflects
bronchopneumonia.  There is also a small amount of right pleural
effusion with minimal patchy infiltrate in the right lower lobe.
IMPRESSION: PICC is in place. There is a slight hyperinflation configuration.
Right basilar infiltrative density is seen with increase in density
and infiltrate compared to prior study.  I am concerned this
reflects bronchopneumonia.  There also appears to be a small amount
of right pleural effusion or thickening.

## 2008-12-30 ENCOUNTER — Encounter: Payer: Self-pay | Admitting: Family Medicine

## 2008-12-30 LAB — CONVERTED CEMR LAB
Basophils Absolute: 0 10*3/uL (ref 0.0–0.1)
Basophils Relative: 0.1 % (ref 0.0–3.0)
Eosinophils Absolute: 0.3 10*3/uL (ref 0.0–0.7)
Eosinophils Relative: 3.5 % (ref 0.0–5.0)
Ferritin: 342.2 ng/mL — ABNORMAL HIGH (ref 10.0–291.0)
HCT: 31.1 % — ABNORMAL LOW (ref 36.0–46.0)
Hemoglobin: 10.2 g/dL — ABNORMAL LOW (ref 12.0–15.0)
Iron: 37 ug/dL — ABNORMAL LOW (ref 42–145)
Lymphocytes Relative: 14.2 % (ref 12.0–46.0)
Lymphs Abs: 1.3 10*3/uL (ref 0.7–4.0)
MCHC: 32.7 g/dL (ref 30.0–36.0)
MCV: 86.6 fL (ref 78.0–100.0)
Monocytes Absolute: 0.6 10*3/uL (ref 0.1–1.0)
Monocytes Relative: 5.9 % (ref 3.0–12.0)
Neutro Abs: 7.3 10*3/uL (ref 1.4–7.7)
Neutrophils Relative %: 76.3 % (ref 43.0–77.0)
Platelets: 255 10*3/uL (ref 150.0–400.0)
RBC: 3.59 M/uL — ABNORMAL LOW (ref 3.87–5.11)
RDW: 13.5 % (ref 11.5–14.6)
Saturation Ratios: 15.6 % — ABNORMAL LOW (ref 20.0–50.0)
Transferrin: 169.5 mg/dL — ABNORMAL LOW (ref 212.0–360.0)
WBC: 9.5 10*3/uL (ref 4.5–10.5)

## 2009-01-12 ENCOUNTER — Telehealth: Payer: Self-pay | Admitting: Family Medicine

## 2009-01-13 ENCOUNTER — Ambulatory Visit: Payer: Self-pay | Admitting: Internal Medicine

## 2009-01-14 IMAGING — CR DG CHEST 2V
2 series · 2 of 2 positions shown · non-contrast
Comparison: Chest x-ray of 03/02/2008

CLINICAL DATA: Cough, shortness of breath, follow-up pneumonia

CHEST - 2 VIEW

[view not recorded (1 of 2)]
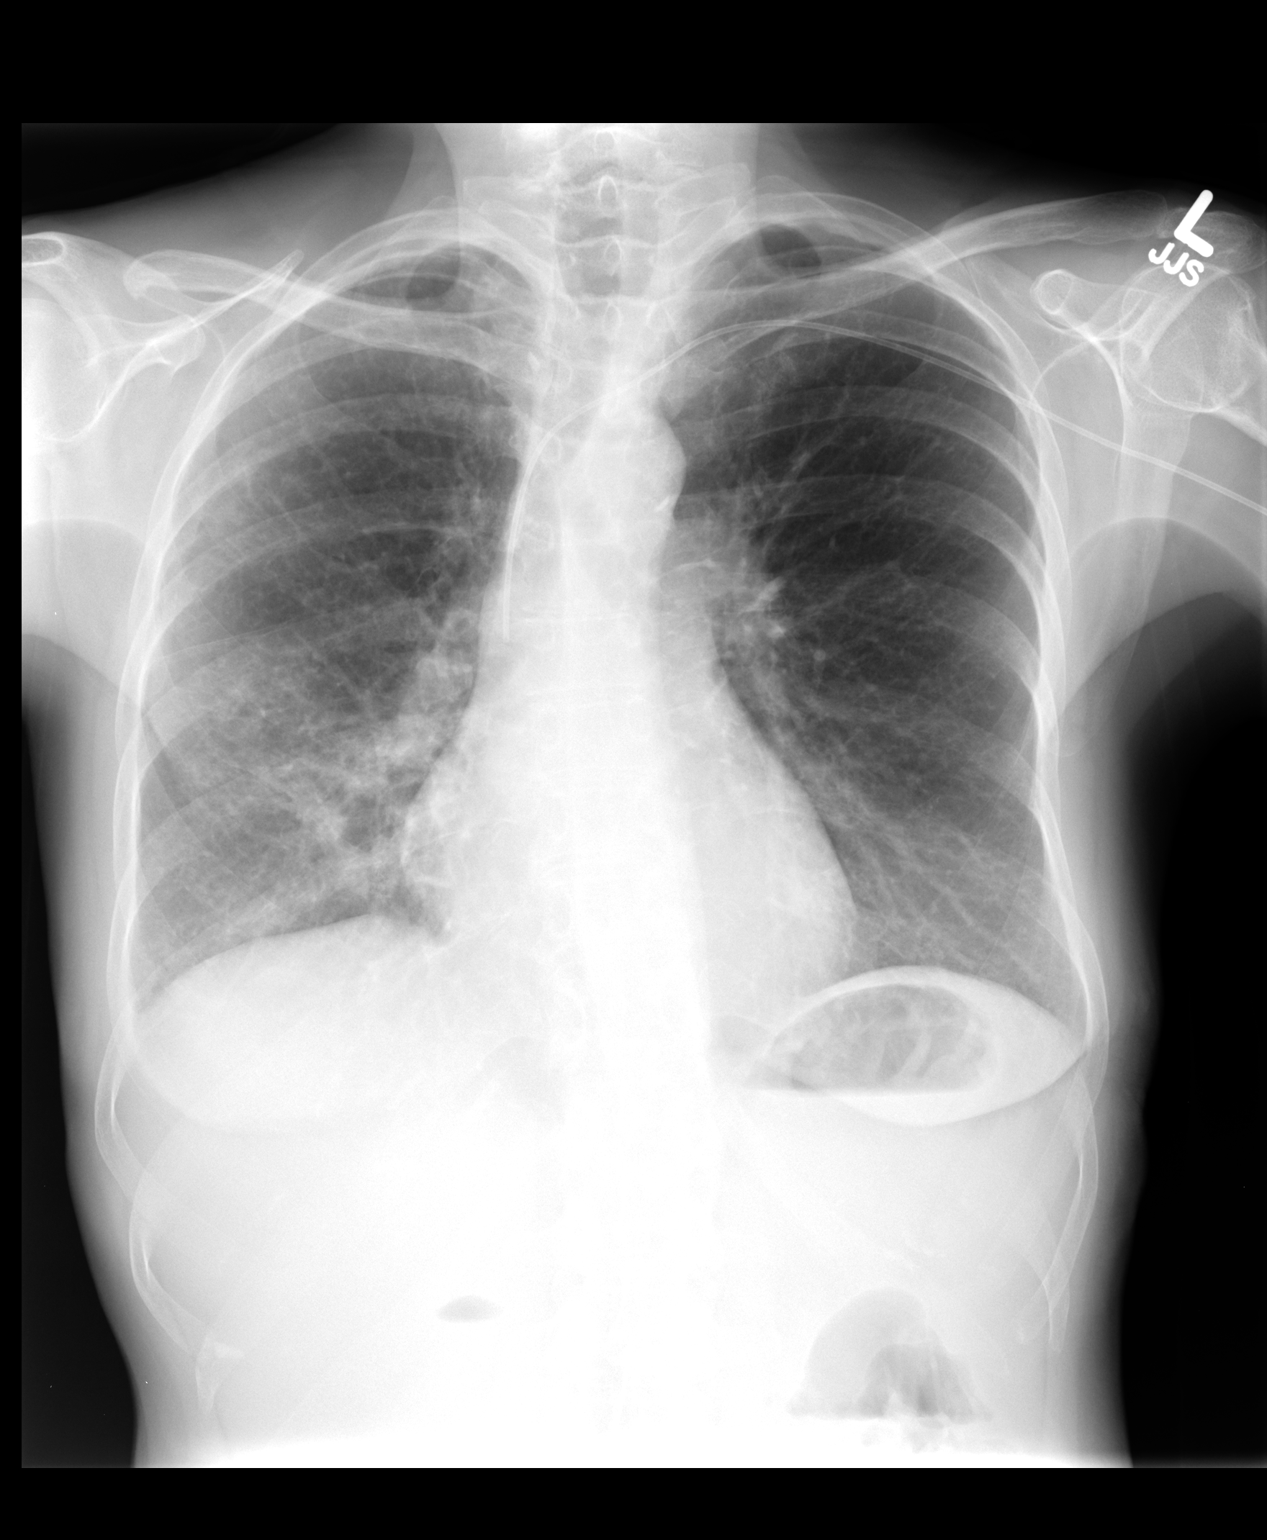

[view not recorded (2 of 2)]
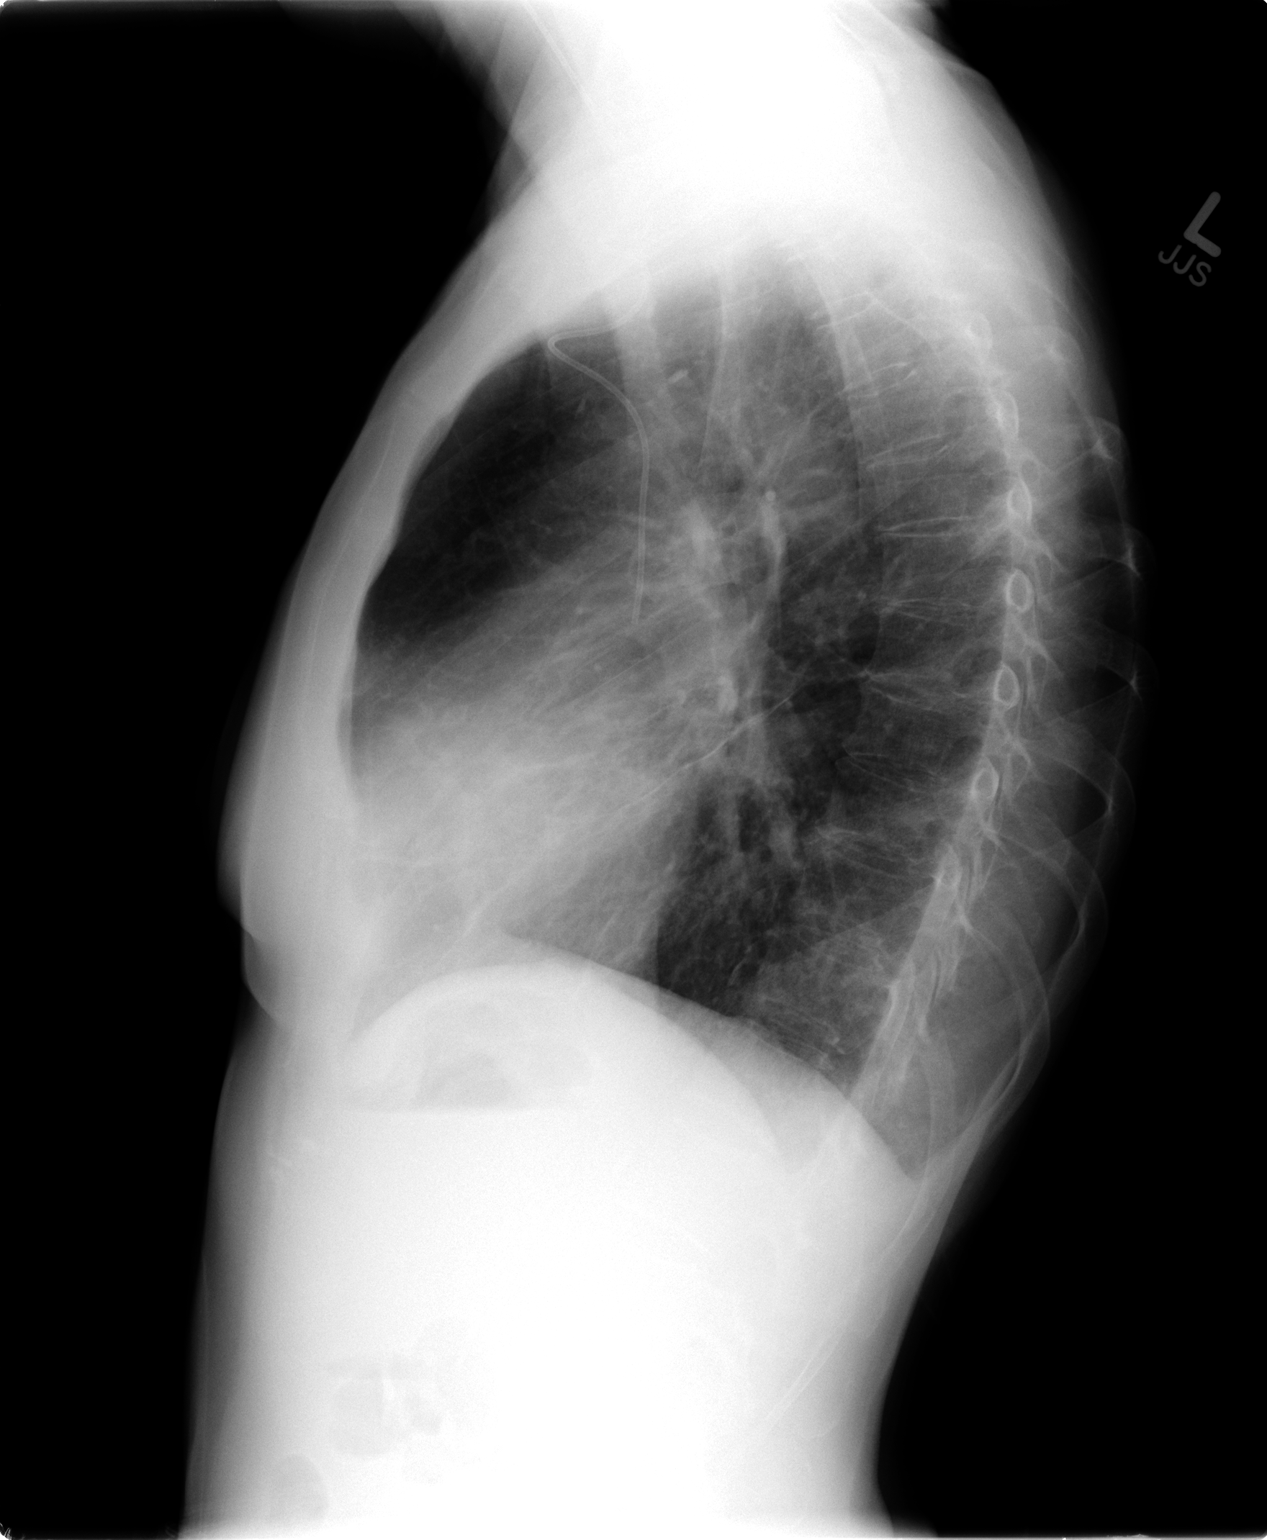

[2 of 2 positions shown; findings below may reference images not displayed]

FINDINGS: The primarily right middle lobe opacity noted previously
remains but has improved somewhat,  consistent with improving right
middle lobe pneumonia.  No other infiltrate or effusion is seen.
The heart is within normal limits in size.  A left PICC line
remains with the tip in the lower SVC.
IMPRESSION: Some improvement right middle lobe pneumonia.

## 2009-01-18 ENCOUNTER — Encounter: Payer: Self-pay | Admitting: Internal Medicine

## 2009-01-28 ENCOUNTER — Ambulatory Visit: Payer: Self-pay | Admitting: Pulmonary Disease

## 2009-02-09 ENCOUNTER — Ambulatory Visit: Payer: Self-pay | Admitting: Gastroenterology

## 2009-02-15 ENCOUNTER — Ambulatory Visit: Payer: Self-pay | Admitting: Family Medicine

## 2009-02-15 LAB — CONVERTED CEMR LAB
Bilirubin Urine: NEGATIVE
Blood in Urine, dipstick: NEGATIVE
Glucose, Urine, Semiquant: NEGATIVE
Ketones, urine, test strip: NEGATIVE
Nitrite: NEGATIVE
Protein, U semiquant: NEGATIVE
Specific Gravity, Urine: 1.01
Urobilinogen, UA: 0.2
WBC Urine, dipstick: NEGATIVE
pH: 6

## 2009-02-16 ENCOUNTER — Telehealth: Payer: Self-pay | Admitting: Internal Medicine

## 2009-02-16 ENCOUNTER — Telehealth: Payer: Self-pay | Admitting: Family Medicine

## 2009-02-17 ENCOUNTER — Encounter: Payer: Self-pay | Admitting: Family Medicine

## 2009-02-17 ENCOUNTER — Ambulatory Visit: Payer: Self-pay | Admitting: Gastroenterology

## 2009-02-17 ENCOUNTER — Encounter (INDEPENDENT_AMBULATORY_CARE_PROVIDER_SITE_OTHER): Payer: Self-pay | Admitting: *Deleted

## 2009-02-17 ENCOUNTER — Encounter: Payer: Self-pay | Admitting: Physician Assistant

## 2009-02-17 ENCOUNTER — Telehealth (INDEPENDENT_AMBULATORY_CARE_PROVIDER_SITE_OTHER): Payer: Self-pay | Admitting: *Deleted

## 2009-02-17 DIAGNOSIS — R1033 Periumbilical pain: Secondary | ICD-10-CM | POA: Insufficient documentation

## 2009-02-17 DIAGNOSIS — K219 Gastro-esophageal reflux disease without esophagitis: Secondary | ICD-10-CM | POA: Insufficient documentation

## 2009-02-17 DIAGNOSIS — R1032 Left lower quadrant pain: Secondary | ICD-10-CM | POA: Insufficient documentation

## 2009-02-17 LAB — CONVERTED CEMR LAB
ALT: 13 units/L (ref 0–35)
AST: 22 units/L (ref 0–37)
Albumin: 3.5 g/dL (ref 3.5–5.2)
Alkaline Phosphatase: 87 units/L (ref 39–117)
BUN: 11 mg/dL (ref 6–23)
Basophils Absolute: 0.1 10*3/uL (ref 0.0–0.1)
Basophils Relative: 0.6 % (ref 0.0–3.0)
Bilirubin, Direct: 0 mg/dL (ref 0.0–0.3)
CO2: 30 meq/L (ref 19–32)
Calcium: 8.5 mg/dL (ref 8.4–10.5)
Chloride: 102 meq/L (ref 96–112)
Creatinine, Ser: 0.8 mg/dL (ref 0.4–1.2)
Eosinophils Absolute: 0.2 10*3/uL (ref 0.0–0.7)
Eosinophils Relative: 1.8 % (ref 0.0–5.0)
Folate: 8.4 ng/mL
GFR calc non Af Amer: 76.69 mL/min (ref >60)
Glucose, Bld: 82 mg/dL (ref 70–99)
HCT: 38.5 % (ref 36.0–46.0)
Hemoglobin: 12.6 g/dL (ref 12.0–15.0)
Iron: 40 ug/dL — ABNORMAL LOW (ref 42–145)
Lymphocytes Relative: 9.3 % — ABNORMAL LOW (ref 12.0–46.0)
Lymphs Abs: 0.8 10*3/uL (ref 0.7–4.0)
MCHC: 32.8 g/dL (ref 30.0–36.0)
MCV: 87.3 fL (ref 78.0–100.0)
Monocytes Absolute: 0.4 10*3/uL (ref 0.1–1.0)
Monocytes Relative: 4.2 % (ref 3.0–12.0)
Neutro Abs: 7.2 10*3/uL (ref 1.4–7.7)
Neutrophils Relative %: 84.1 % — ABNORMAL HIGH (ref 43.0–77.0)
Platelets: 418 10*3/uL — ABNORMAL HIGH (ref 150.0–400.0)
Potassium: 4.2 meq/L (ref 3.5–5.1)
RBC: 4.41 M/uL (ref 3.87–5.11)
RDW: 14 % (ref 11.5–14.6)
Saturation Ratios: 12.6 % — ABNORMAL LOW (ref 20.0–50.0)
Sodium: 138 meq/L (ref 135–145)
TSH: 1.05 microintl units/mL (ref 0.35–5.50)
Total Bilirubin: 0.5 mg/dL (ref 0.3–1.2)
Total Protein: 6.9 g/dL (ref 6.0–8.3)
Transferrin: 226.7 mg/dL (ref 212.0–360.0)
Vit D, 25-Hydroxy: 18 ng/mL — ABNORMAL LOW (ref 30–89)
Vitamin B-12: 681 pg/mL (ref 211–911)
WBC: 8.7 10*3/uL (ref 4.5–10.5)

## 2009-02-22 ENCOUNTER — Ambulatory Visit: Payer: Self-pay | Admitting: Internal Medicine

## 2009-03-04 ENCOUNTER — Encounter: Payer: Self-pay | Admitting: Family Medicine

## 2009-03-07 ENCOUNTER — Ambulatory Visit: Payer: Self-pay | Admitting: Pulmonary Disease

## 2009-03-08 ENCOUNTER — Encounter (INDEPENDENT_AMBULATORY_CARE_PROVIDER_SITE_OTHER): Payer: Self-pay | Admitting: *Deleted

## 2009-03-08 ENCOUNTER — Ambulatory Visit (HOSPITAL_COMMUNITY): Admission: RE | Admit: 2009-03-08 | Discharge: 2009-03-08 | Payer: Self-pay | Admitting: General Surgery

## 2009-03-14 ENCOUNTER — Ambulatory Visit: Payer: Self-pay | Admitting: Gastroenterology

## 2009-03-15 ENCOUNTER — Telehealth: Payer: Self-pay | Admitting: Family Medicine

## 2009-03-16 IMAGING — CT CT CHEST W/ CM
2 of 4 series · 15 of 36 positions shown, 18 images · IV contrast (Omnipaque 300)
Comparison: 08/12/2007

CLINICAL DATA: Evaluate right lower lobe pneumonia

CT CHEST WITH CONTRAST
TECHNIQUE: Multidetector CT imaging of the chest was performed
following the standard protocol during bolus administration of
intravenous contrast.
Contrast: 60 ml of omni 300

[Series 2: chest routine with · axial · 0.62mm/px · z∈[-274,-29]mm · 12 of 59 slices shown, 15 images]
[im 5/59  mediastinal]
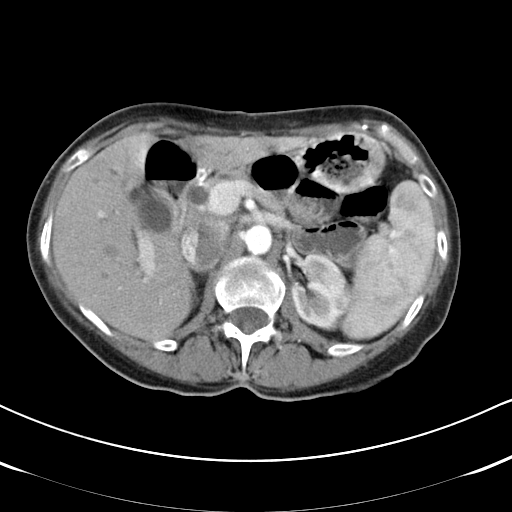
[im 5/59  lung]
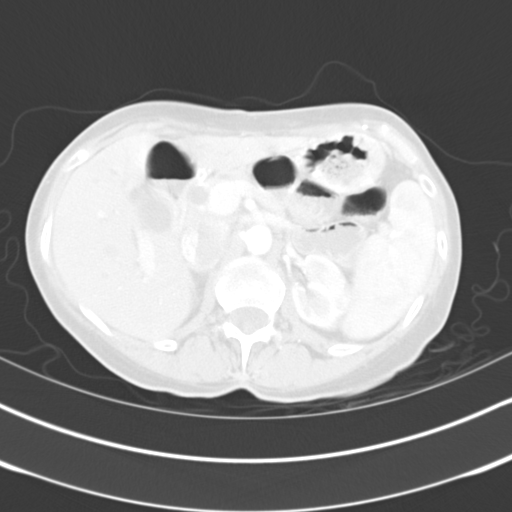
[im 9/59  lung]
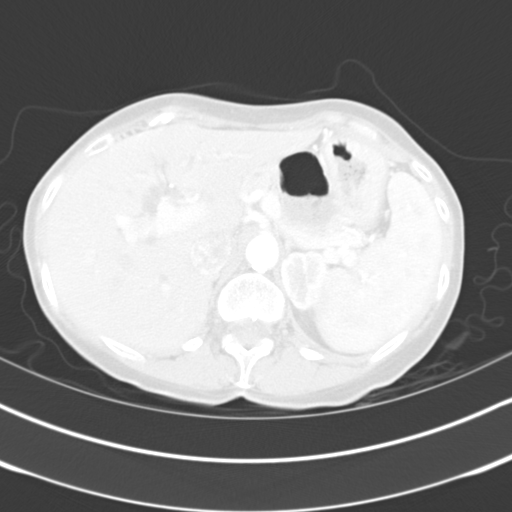
[im 14/59  lung]
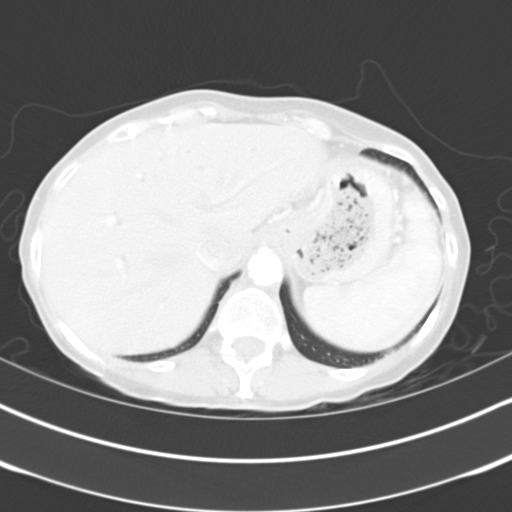
[im 18/59  lung]
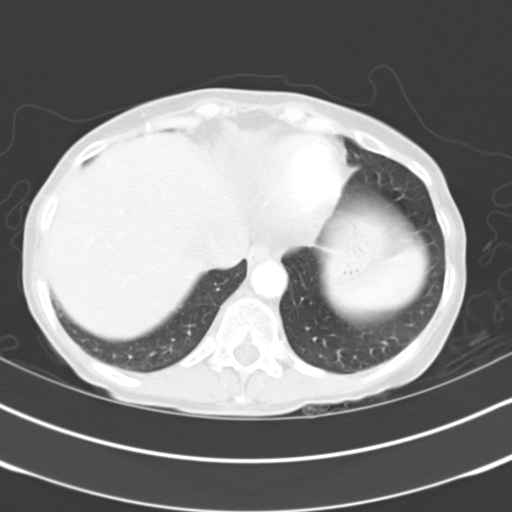
[im 23/59  mediastinal]
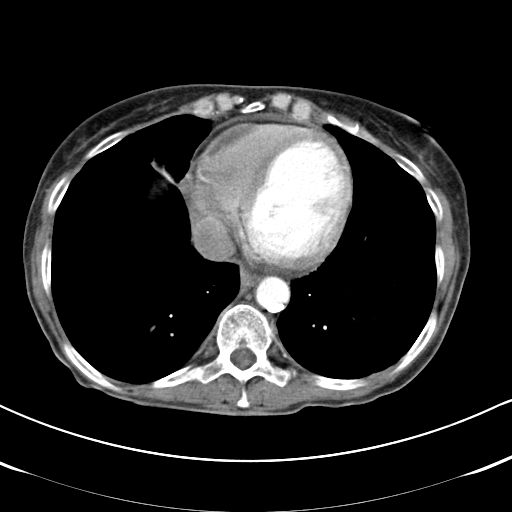
[im 23/59  lung]
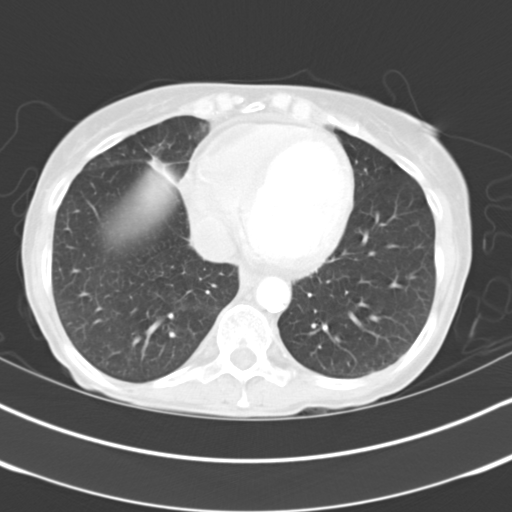
[im 27/59  lung]
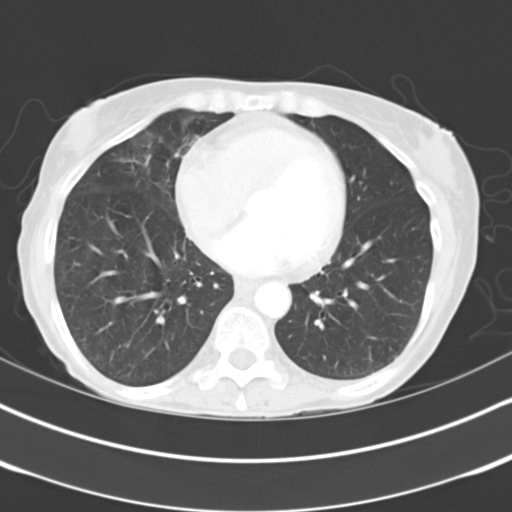
[im 32/59  lung]
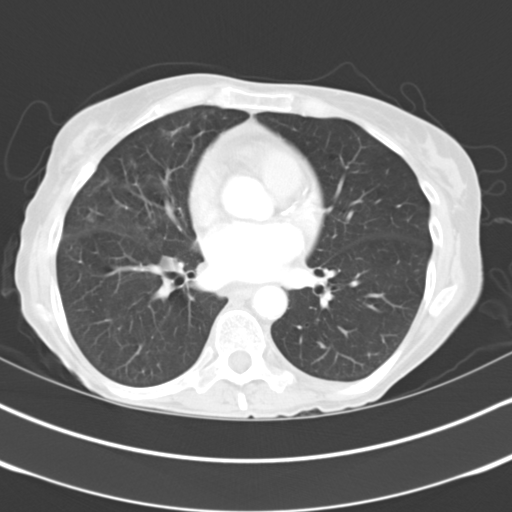
[im 36/59  lung]
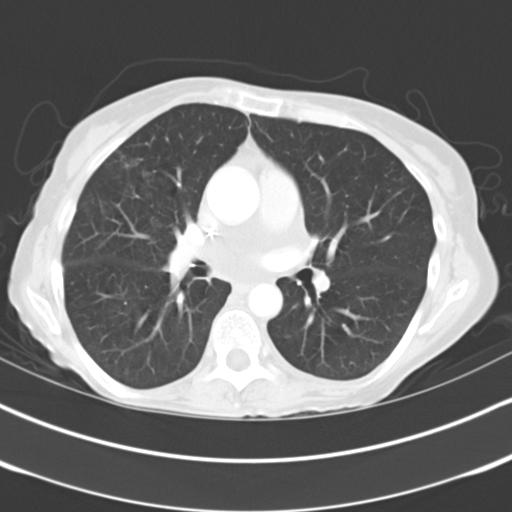
[im 41/59  mediastinal]
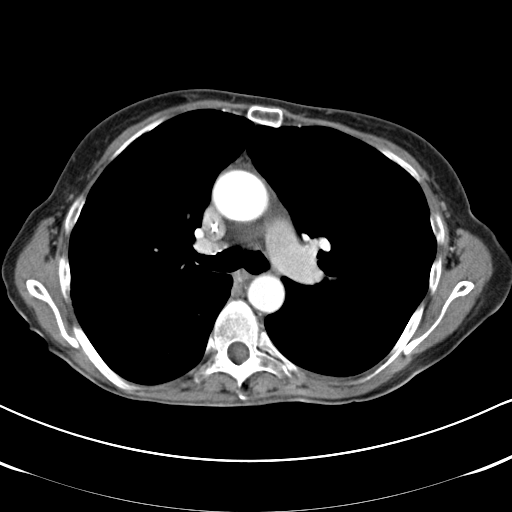
[im 41/59  lung]
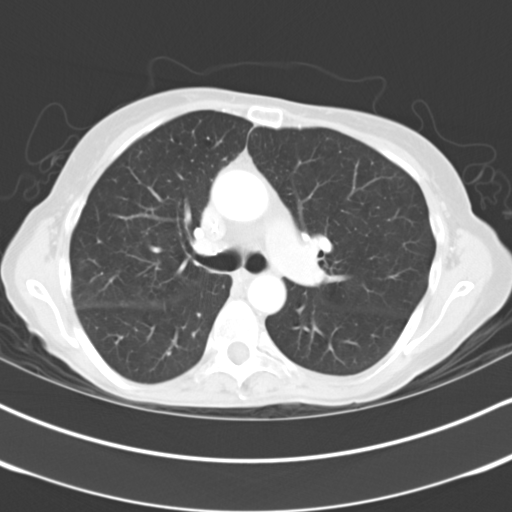
[im 45/59  lung]
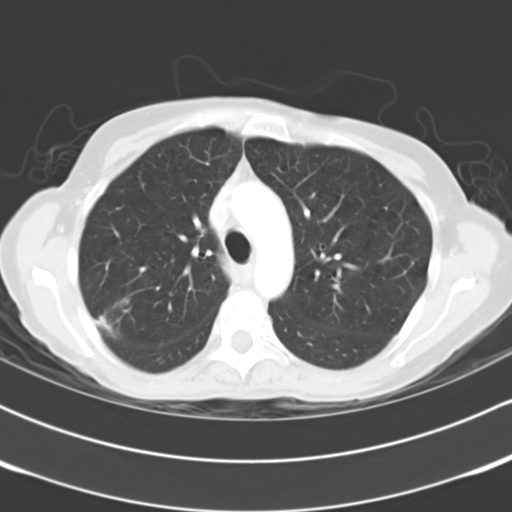
[im 50/59  lung]
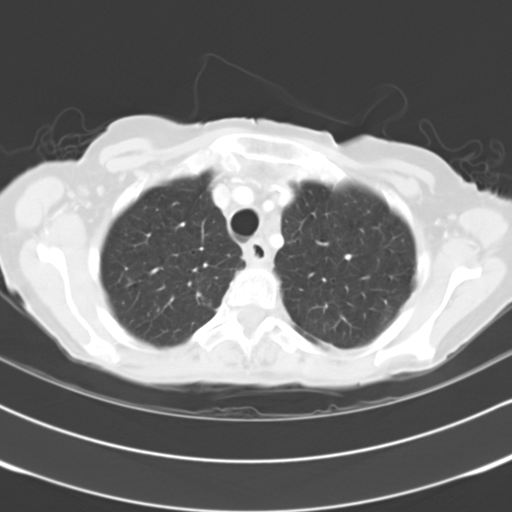
[im 54/59  lung]
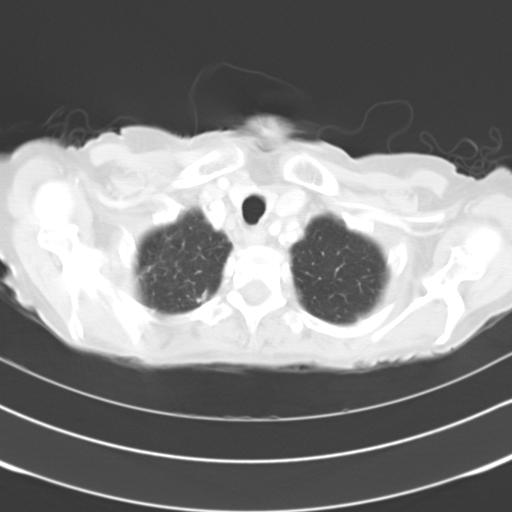

[Series 602: <mpr range> · coronal · 0.62mm/px · 3 of 98 slices shown]
[im 20/98  lung]
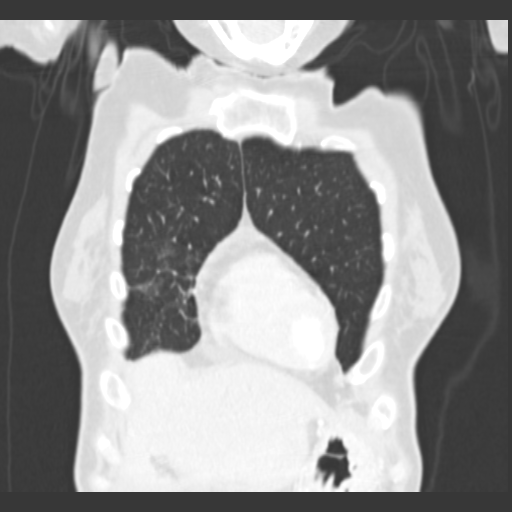
[im 39/98  lung]
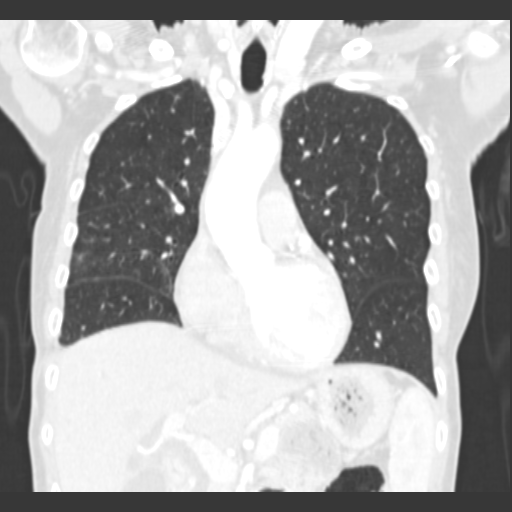
[im 59/98  lung]
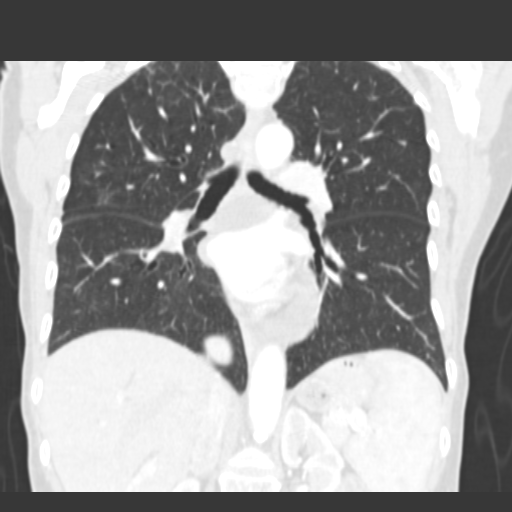

[15 of 36 positions shown; findings below may reference images not displayed]

FINDINGS: There are no enlarged axillary or supraclavicular lymph
nodes.  There is no enlarged mediastinal or hilar lymph nodes.  A
small pericardial effusion is noted.  There is no pleural effusion.

The left lung is clear.

Ill-defined right middle lobe densities are noted, image number 29.

There is no pulmonary nodule or mass.

Scar like densities seen within the right upper lobe, image number
14.

The trachea is midline and patent.

There are no abnormal filling defects within the central airway.

Review of the visualized osseous structures shows a fracture
deformity involving anterior right rib, image number 29.  This is
new when compared with the previous exam.

Limited imaging through the upper abdomen shows dilatation of the
intra and extrahepatic bile ducts.  The common bile duct measures
9.4 mm, image 56.

This is a new finding compared with the previous examination.  The
distal common bile duct is not visualized.
IMPRESSION: 1.  Ill-defined densities within the right middle lobe likely
represent areas of resolving infection.  There is no predisposing
mass or adenopathy identified.
2.  Interval increase in caliber of the intra and extrahepatic bile
ducts.  No mass or choledocholithiasis is identified.  However, the
distal common bile duct is not visualized.  Careful clinical
correlation advised.

## 2009-03-30 ENCOUNTER — Ambulatory Visit: Payer: Self-pay | Admitting: Hematology & Oncology

## 2009-03-30 ENCOUNTER — Telehealth (INDEPENDENT_AMBULATORY_CARE_PROVIDER_SITE_OTHER): Payer: Self-pay | Admitting: *Deleted

## 2009-04-01 ENCOUNTER — Encounter: Payer: Self-pay | Admitting: Internal Medicine

## 2009-04-01 LAB — CBC WITH DIFFERENTIAL (CANCER CENTER ONLY)
BASO#: 0 10e3/uL (ref 0.0–0.2)
BASO%: 0.4 % (ref 0.0–2.0)
EOS%: 1.6 % (ref 0.0–7.0)
Eosinophils Absolute: 0.1 10e3/uL (ref 0.0–0.5)
HCT: 32.4 % — ABNORMAL LOW (ref 34.8–46.6)
HGB: 10.7 g/dL — ABNORMAL LOW (ref 11.6–15.9)
LYMPH#: 1 10e3/uL (ref 0.9–3.3)
LYMPH%: 12.5 % — ABNORMAL LOW (ref 14.0–48.0)
MCH: 27.3 pg (ref 26.0–34.0)
MCHC: 32.9 g/dL (ref 32.0–36.0)
MCV: 83 fL (ref 81–101)
MONO#: 0.5 10e3/uL (ref 0.1–0.9)
MONO%: 6.2 % (ref 0.0–13.0)
NEUT#: 6.2 10e3/uL (ref 1.5–6.5)
NEUT%: 79.3 % (ref 39.6–80.0)
Platelets: 420 10e3/uL — ABNORMAL HIGH (ref 145–400)
RBC: 3.89 10e6/uL (ref 3.70–5.32)
RDW: 13.6 % (ref 10.5–14.6)
WBC: 7.8 10e3/uL (ref 3.9–10.0)

## 2009-04-01 LAB — RETICULOCYTES (CHCC)
ABS Retic: 64.6 10*3/uL (ref 19.0–186.0)
RBC.: 3.8 MIL/uL — ABNORMAL LOW (ref 3.87–5.11)
Retic Ct Pct: 1.7 % (ref 0.4–3.1)

## 2009-04-01 LAB — CHCC SATELLITE - SMEAR

## 2009-04-04 ENCOUNTER — Telehealth: Payer: Self-pay | Admitting: Family Medicine

## 2009-04-04 LAB — FERRITIN: Ferritin: 467 ng/mL — ABNORMAL HIGH (ref 10–291)

## 2009-04-04 LAB — TRANSFERRIN RECEPTOR, SOLUABLE: Transferrin Receptor, Soluble: 20.6 nmol/L

## 2009-04-05 ENCOUNTER — Encounter: Payer: Self-pay | Admitting: Internal Medicine

## 2009-04-07 ENCOUNTER — Ambulatory Visit: Payer: Self-pay | Admitting: Adult Health

## 2009-04-08 IMAGING — CR DG CHEST 2V
2 series · 2 of 2 positions shown · non-contrast
Comparison: 04/22/2008

CLINICAL DATA: Pneumonia.

CHEST - 2 VIEW

[view not recorded (1 of 2)]
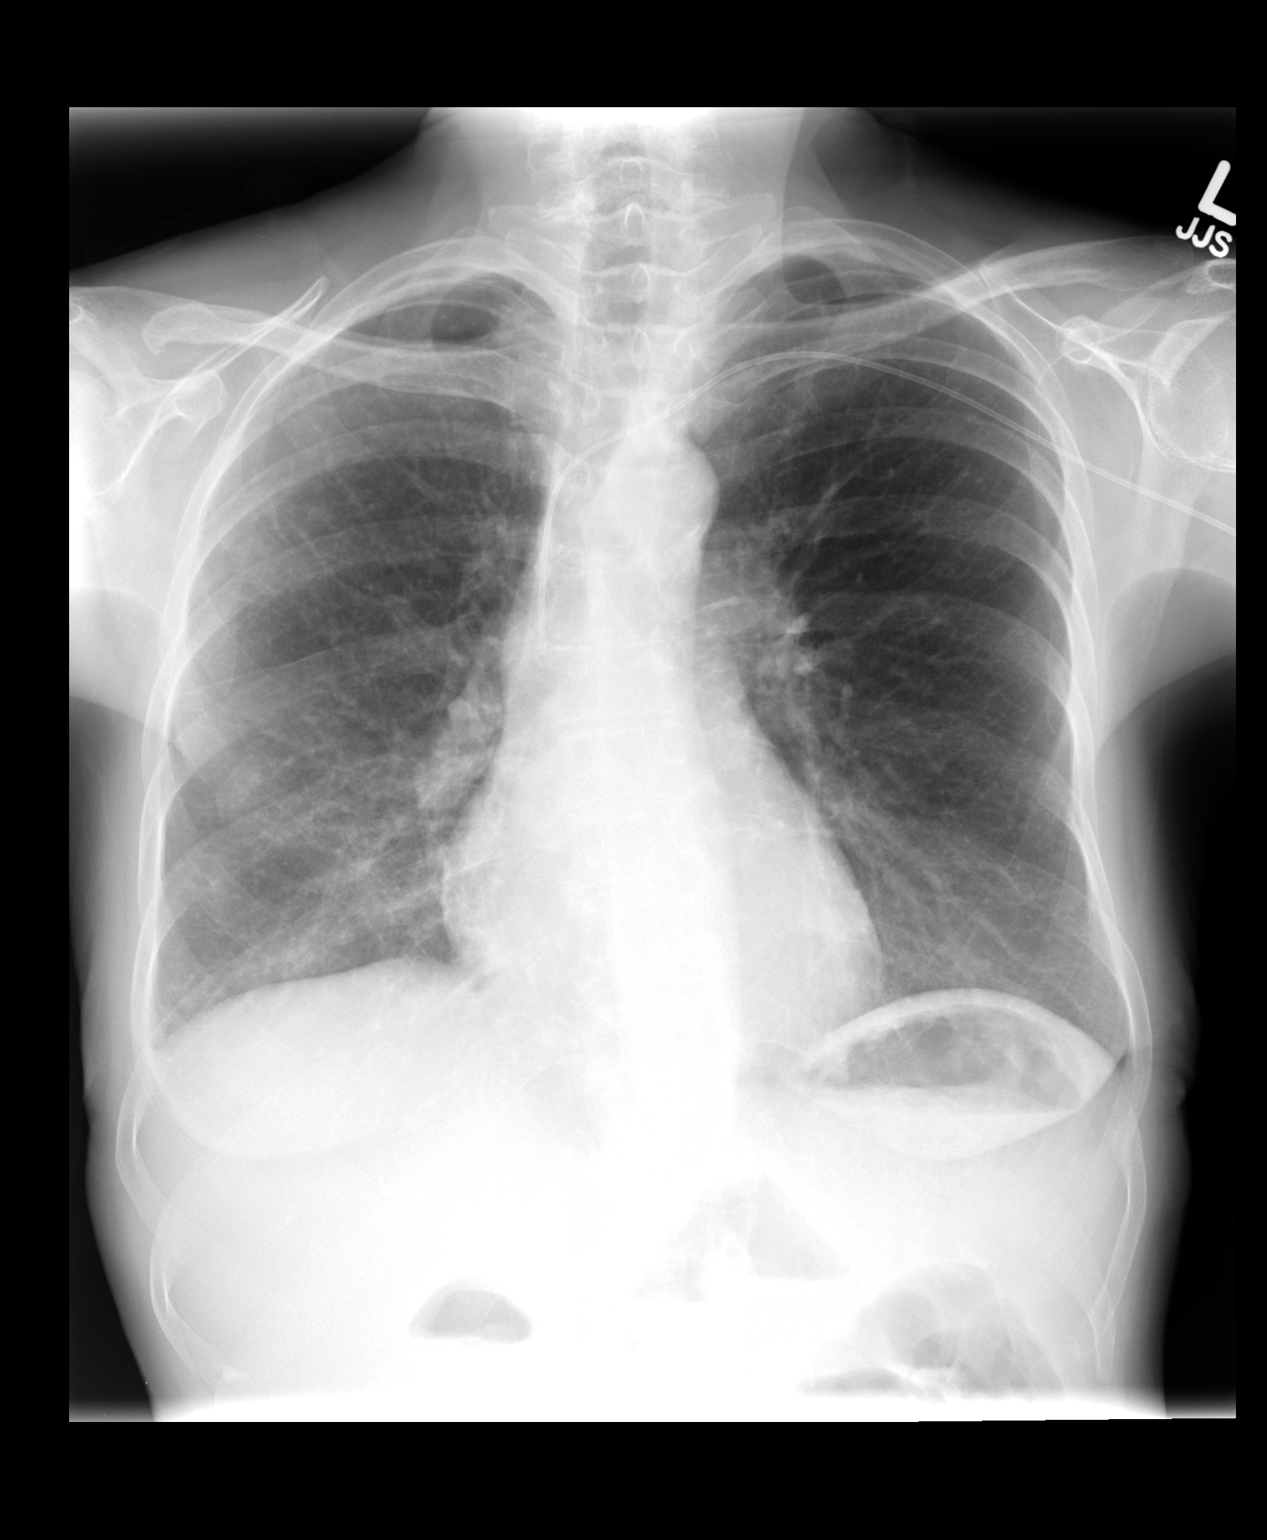

[view not recorded (2 of 2)]
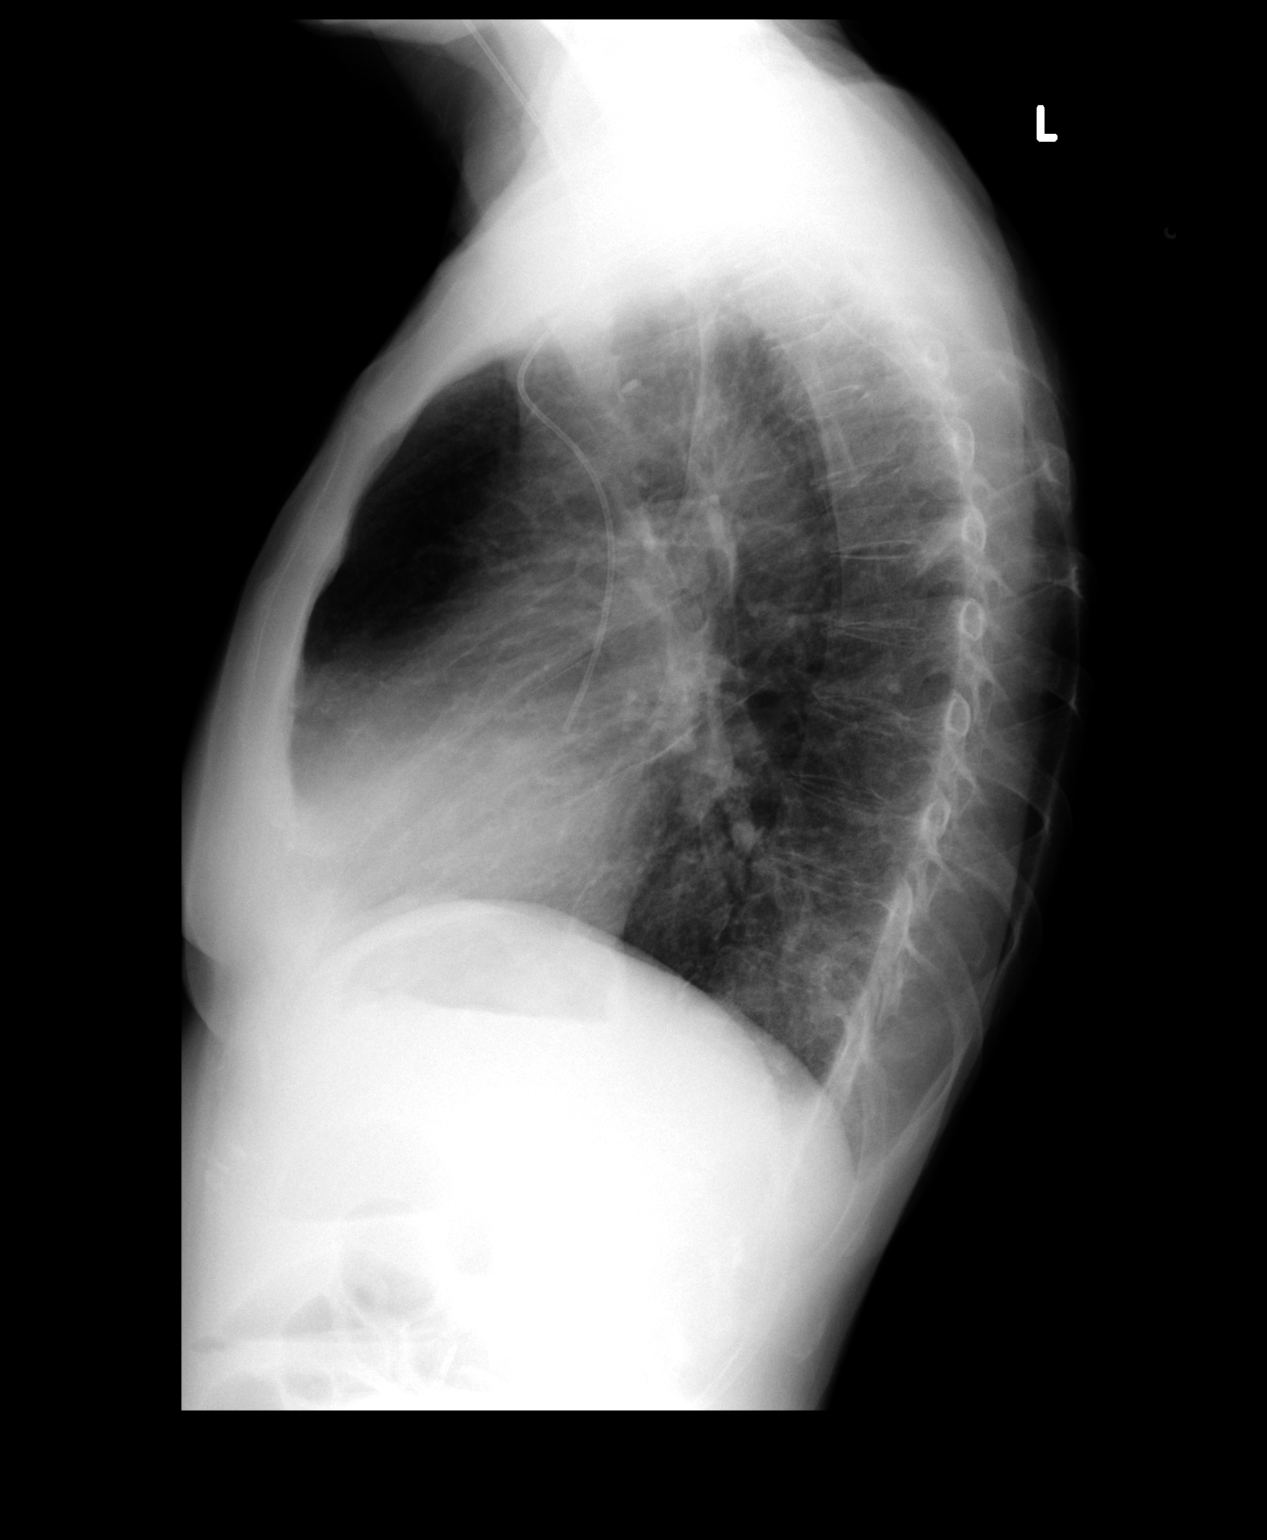

[2 of 2 positions shown; findings below may reference images not displayed]

FINDINGS: The left PICC line is stable.  There are chronic
interstitial changes more pronounced in the right lower lobe but no
definite airspace consolidation or pleural effusion.
IMPRESSION: 1.  Stable PICC line.
2.  Chronic asymmetric interstitial changes.  No definite
pneumonia.

## 2009-04-11 ENCOUNTER — Telehealth (INDEPENDENT_AMBULATORY_CARE_PROVIDER_SITE_OTHER): Payer: Self-pay | Admitting: *Deleted

## 2009-04-11 ENCOUNTER — Ambulatory Visit: Payer: Self-pay | Admitting: Internal Medicine

## 2009-04-15 ENCOUNTER — Telehealth: Payer: Self-pay | Admitting: Family Medicine

## 2009-04-20 ENCOUNTER — Telehealth: Payer: Self-pay | Admitting: Adult Health

## 2009-04-27 ENCOUNTER — Inpatient Hospital Stay (HOSPITAL_COMMUNITY): Admission: RE | Admit: 2009-04-27 | Discharge: 2009-05-06 | Payer: Self-pay | Admitting: General Surgery

## 2009-04-27 HISTORY — PX: EXPLORATORY LAPAROTOMY: SUR591

## 2009-05-06 ENCOUNTER — Encounter (INDEPENDENT_AMBULATORY_CARE_PROVIDER_SITE_OTHER): Payer: Self-pay | Admitting: *Deleted

## 2009-05-09 ENCOUNTER — Telehealth: Payer: Self-pay | Admitting: Family Medicine

## 2009-05-10 ENCOUNTER — Encounter: Payer: Self-pay | Admitting: Family Medicine

## 2009-05-12 ENCOUNTER — Telehealth: Payer: Self-pay | Admitting: Family Medicine

## 2009-05-13 ENCOUNTER — Encounter: Payer: Self-pay | Admitting: Internal Medicine

## 2009-05-23 ENCOUNTER — Ambulatory Visit: Payer: Self-pay | Admitting: Internal Medicine

## 2009-05-25 ENCOUNTER — Telehealth: Payer: Self-pay | Admitting: Family Medicine

## 2009-05-31 ENCOUNTER — Ambulatory Visit: Payer: Self-pay | Admitting: Family Medicine

## 2009-06-01 ENCOUNTER — Ambulatory Visit: Payer: Self-pay | Admitting: Hematology & Oncology

## 2009-06-03 ENCOUNTER — Telehealth: Payer: Self-pay | Admitting: Family Medicine

## 2009-06-03 ENCOUNTER — Encounter (INDEPENDENT_AMBULATORY_CARE_PROVIDER_SITE_OTHER): Payer: Self-pay | Admitting: *Deleted

## 2009-06-09 ENCOUNTER — Telehealth: Payer: Self-pay | Admitting: Family Medicine

## 2009-06-13 ENCOUNTER — Encounter: Payer: Self-pay | Admitting: Family Medicine

## 2009-06-15 ENCOUNTER — Ambulatory Visit: Payer: Self-pay | Admitting: Internal Medicine

## 2009-06-15 ENCOUNTER — Telehealth: Payer: Self-pay | Admitting: Family Medicine

## 2009-06-21 ENCOUNTER — Telehealth (INDEPENDENT_AMBULATORY_CARE_PROVIDER_SITE_OTHER): Payer: Self-pay | Admitting: *Deleted

## 2009-06-30 ENCOUNTER — Encounter: Payer: Self-pay | Admitting: Family Medicine

## 2009-06-30 IMAGING — CR DG CHEST 2V
2 series · 2 of 2 positions shown · non-contrast
Comparison: 06/10/2008

CLINICAL DATA: Cough and shortness of breath.

CHEST - 2 VIEW

[view not recorded (1 of 2)]
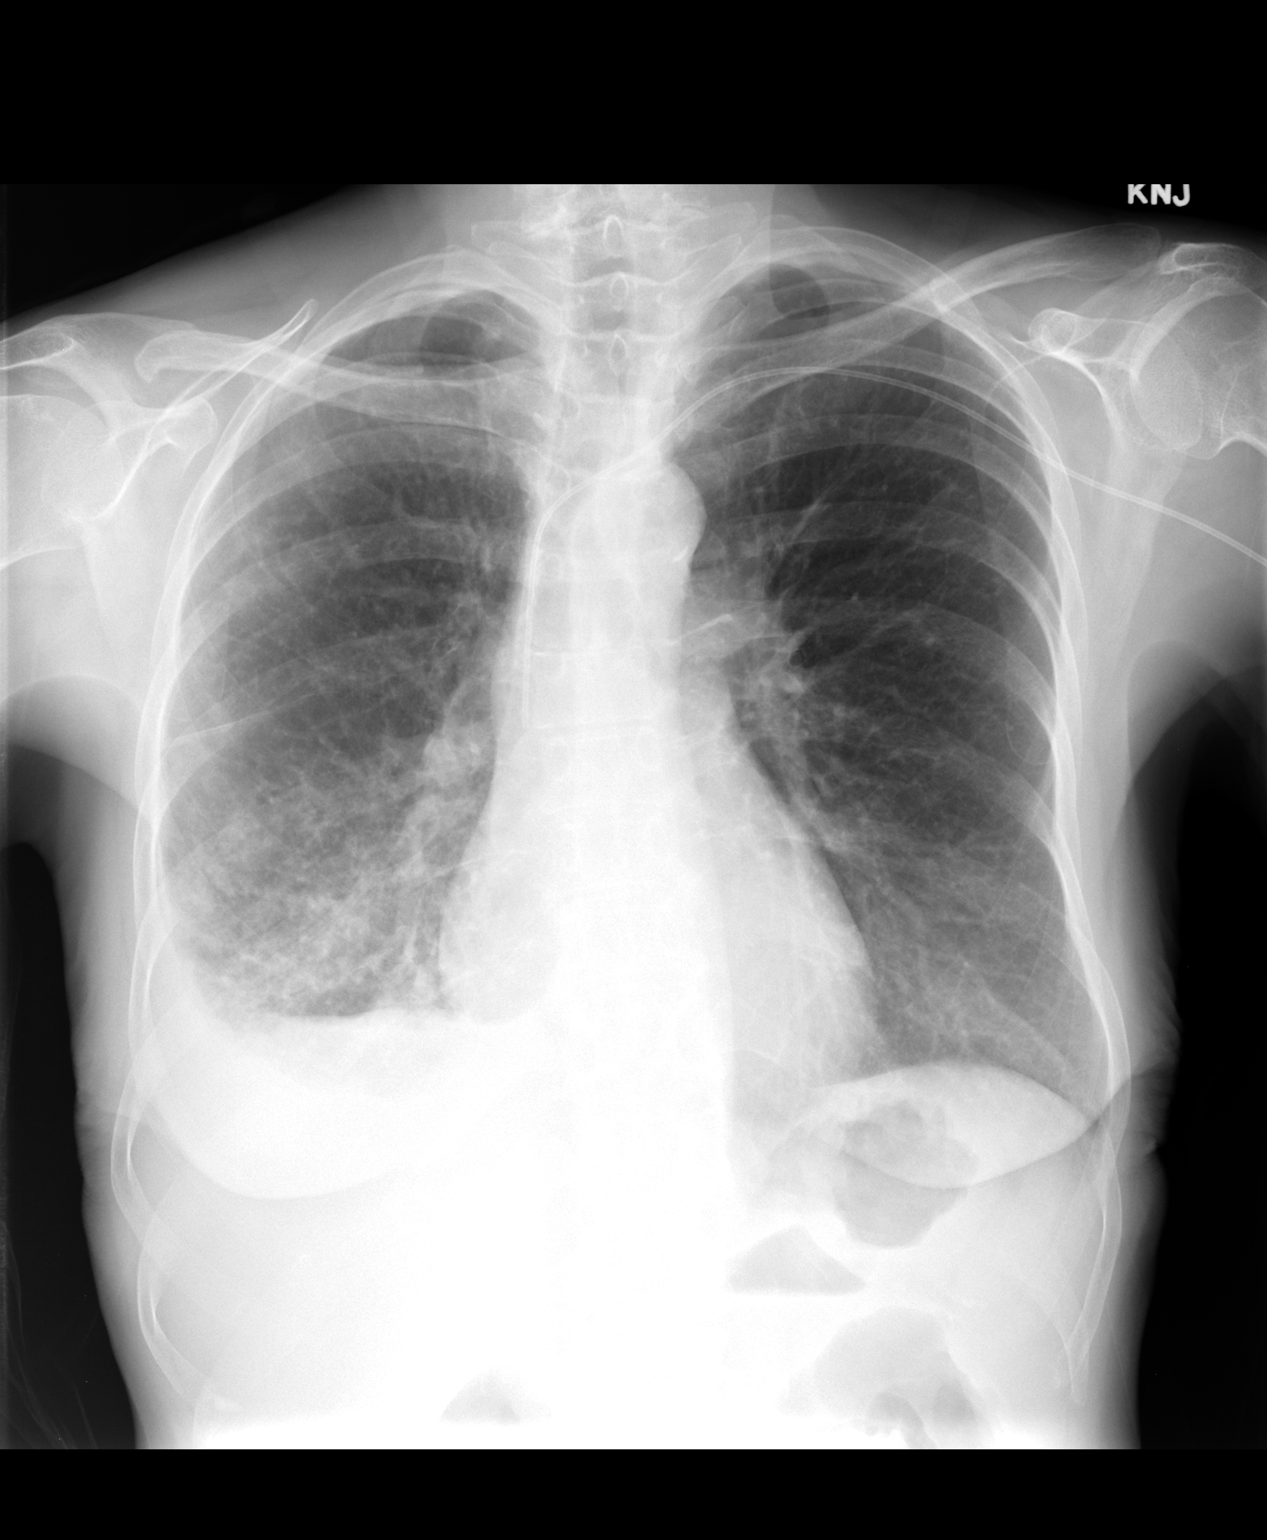

[view not recorded (2 of 2)]
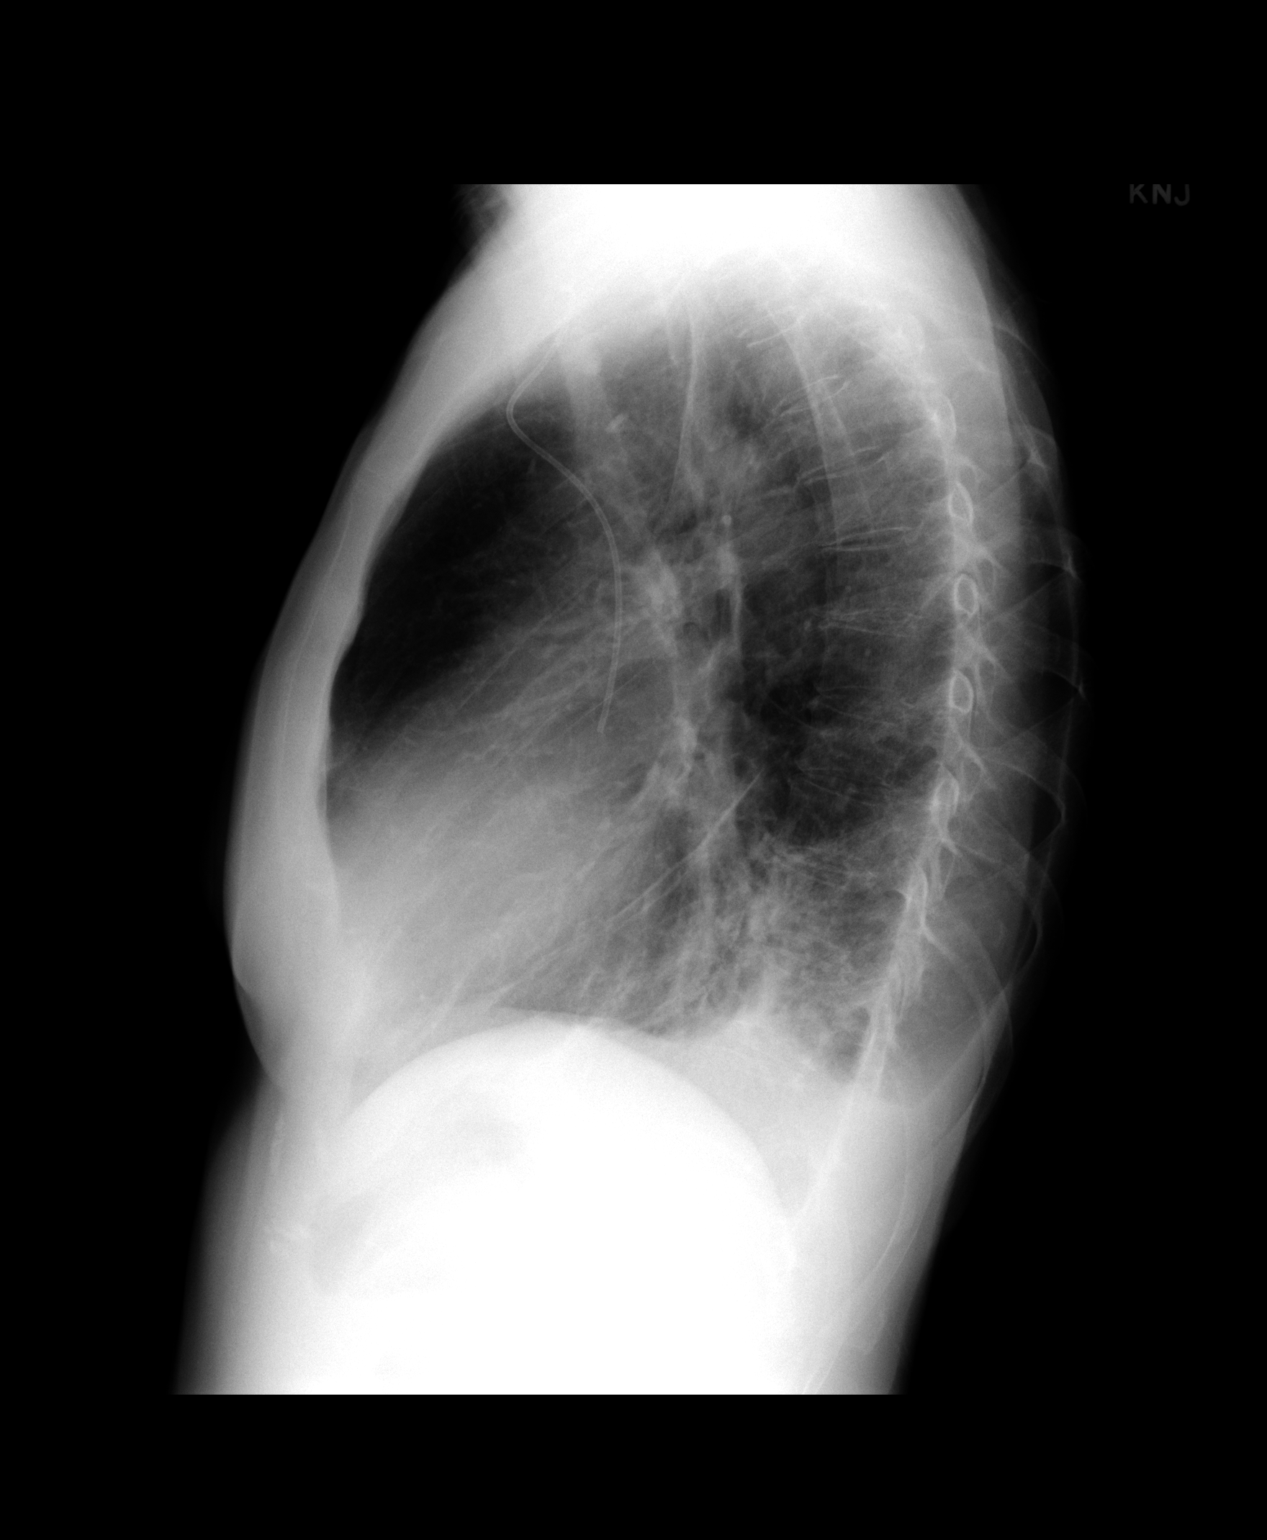

[2 of 2 positions shown; findings below may reference images not displayed]

FINDINGS: Trachea is midline.  Heart size normal.  There is new air
space disease in the right lower lobe, with a new right pleural
effusion.  Left lung clear.  Left PICC tip projects over the SVC.
IMPRESSION: Right lower lobe pneumonia with a right pleural effusion.  This
could be followed to clearing, as clinically indicated.

## 2009-07-05 ENCOUNTER — Ambulatory Visit (HOSPITAL_COMMUNITY): Admission: RE | Admit: 2009-07-05 | Discharge: 2009-07-05 | Payer: Self-pay | Admitting: Surgery

## 2009-07-05 ENCOUNTER — Ambulatory Visit: Payer: Self-pay | Admitting: Hematology & Oncology

## 2009-07-05 ENCOUNTER — Encounter (INDEPENDENT_AMBULATORY_CARE_PROVIDER_SITE_OTHER): Payer: Self-pay | Admitting: *Deleted

## 2009-07-06 IMAGING — US US ABDOMEN COMPLETE
1 series · 13 of 25 positions shown · non-contrast
Comparison: CT of the chest of 09/01/2008

CLINICAL DATA: Dilated biliary ducts noted on CT of the chest

COMPLETE ABDOMINAL ULTRASOUND

[Series 1: us abdomen complete · 0.15mm/px · 13 of 123 slices shown]
[im 1/123]
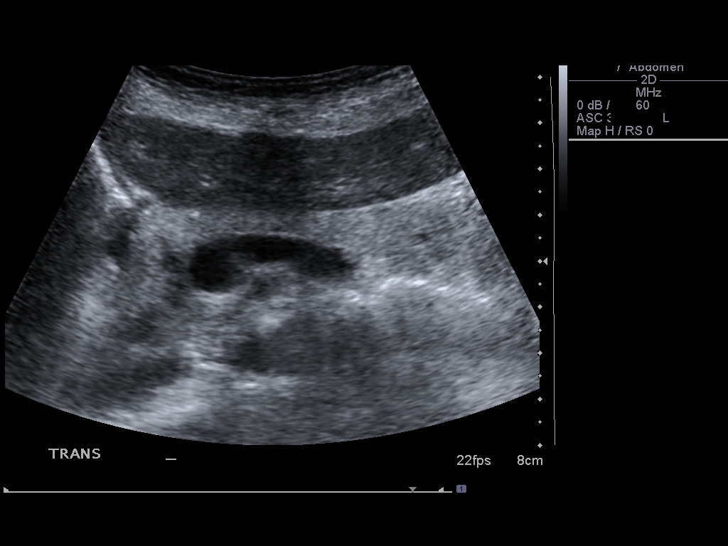
[im 11/123]
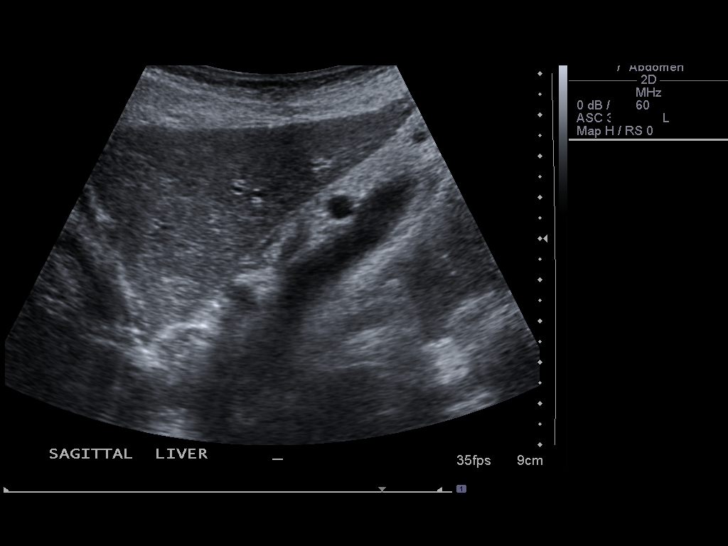
[im 21/123]
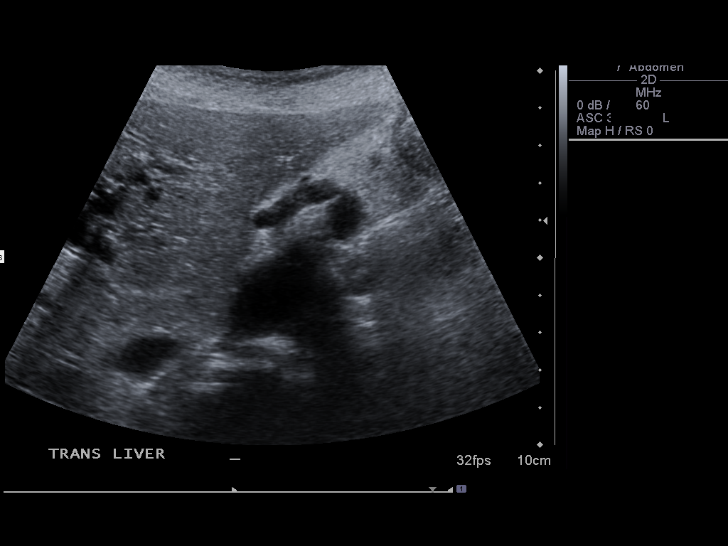
[im 31/123]
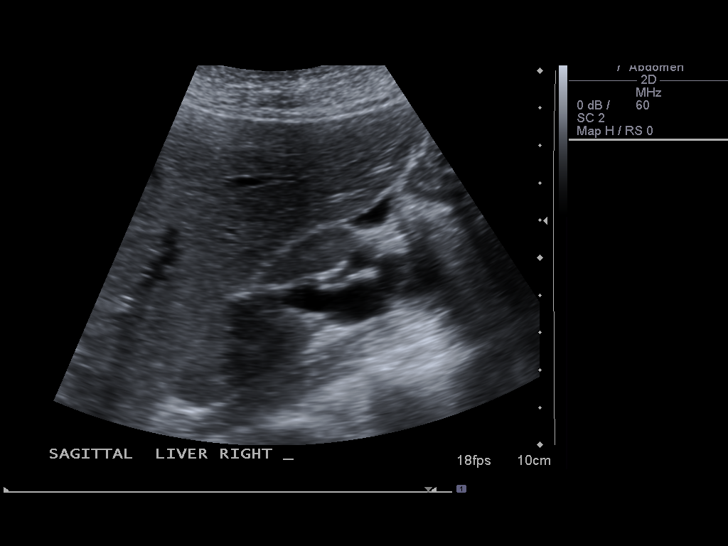
[im 41/123]
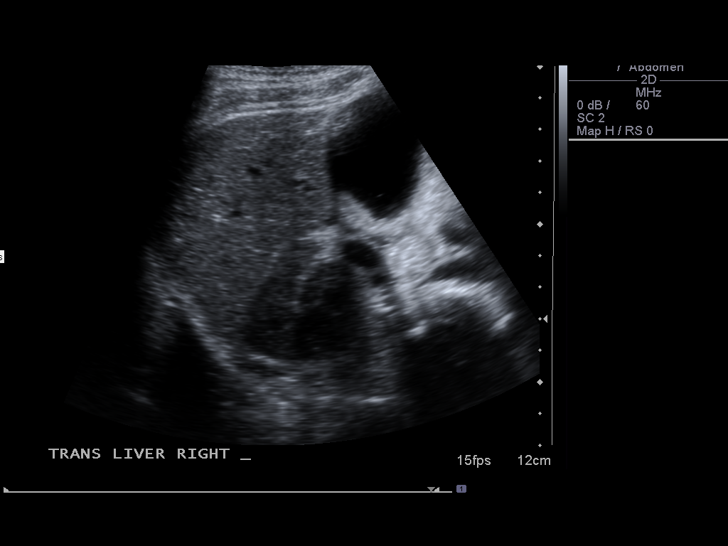
[im 51/123]
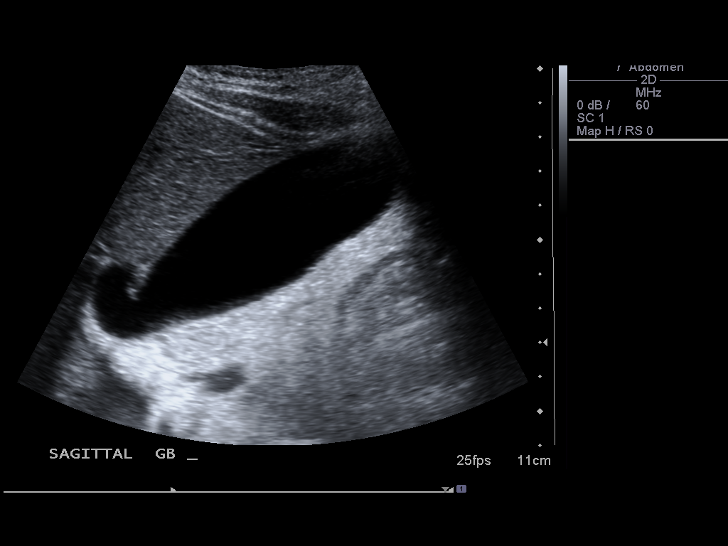
[im 62/123]
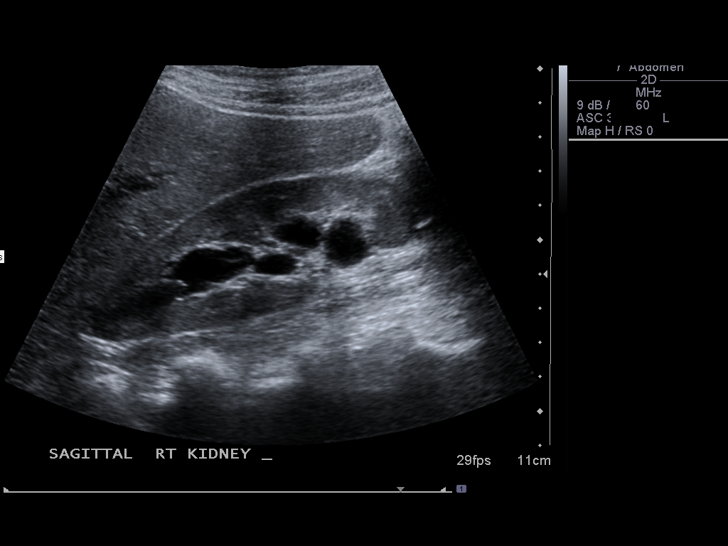
[im 72/123]
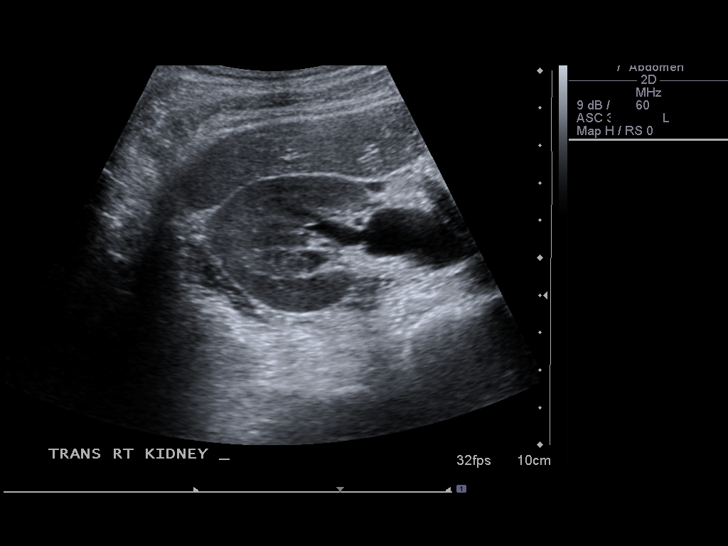
[im 82/123]
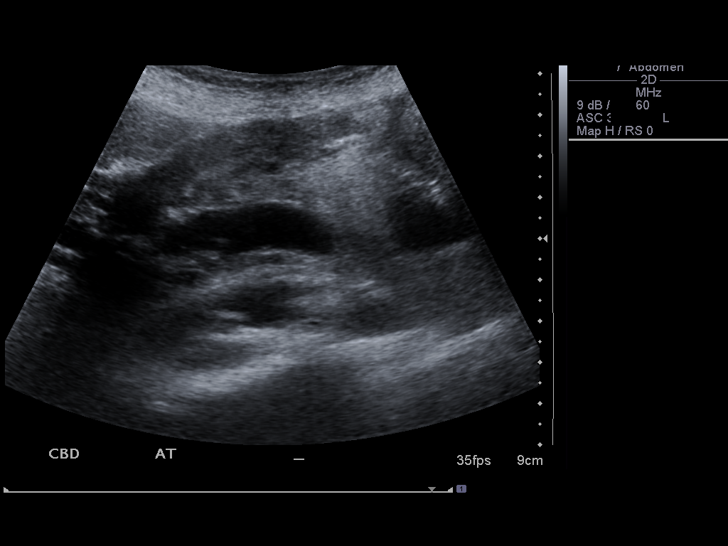
[im 92/123]
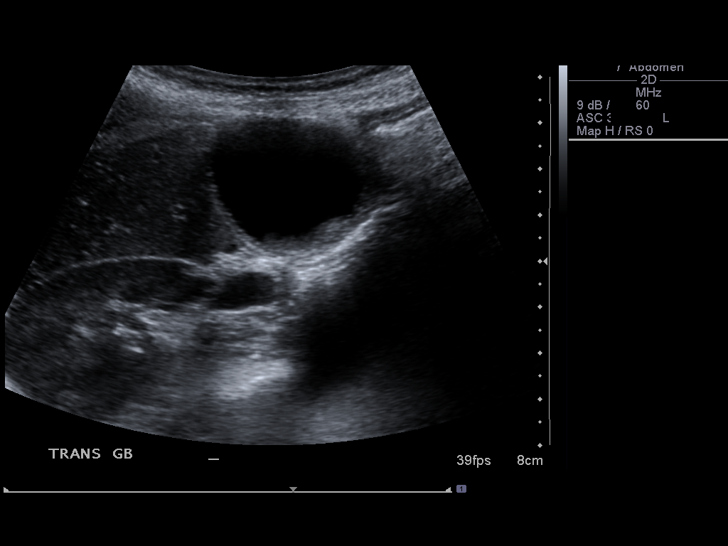
[im 102/123]
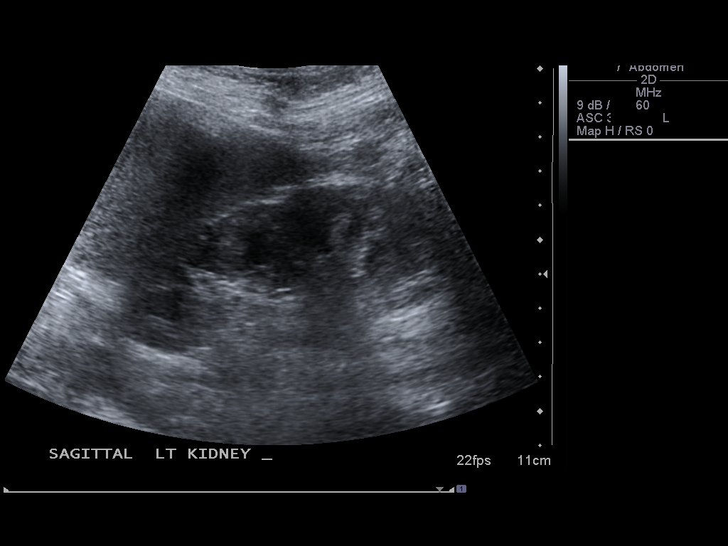
[im 112/123]
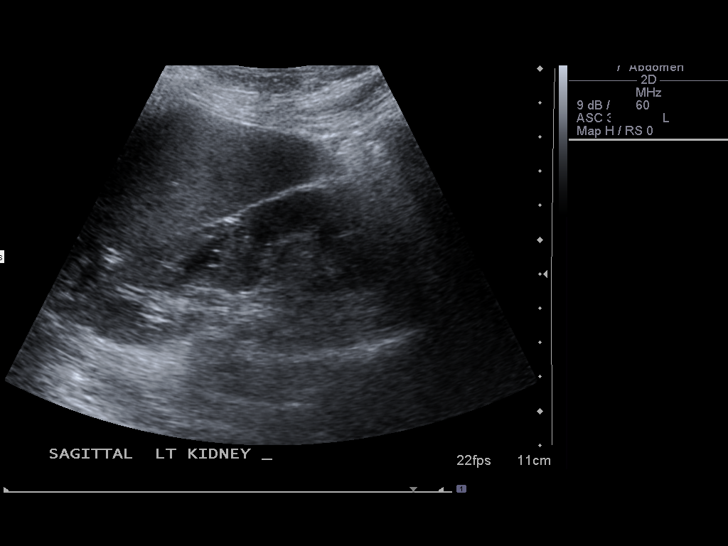
[im 123/123]
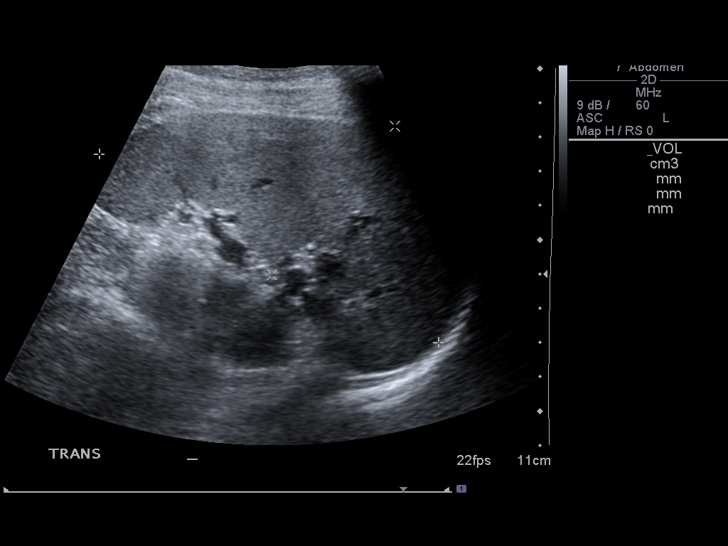

[13 of 25 positions shown; findings below may reference images not displayed]

FINDINGS: Gallbladder:  There is some gallbladder sludge present but no
definite gallstones are noted.  There is no pain over the
gallbladder upon compression.

Common bile duct:  Common bile duct is dilated measuring 10.7 mm in
diameter.

Liver:  No focal hepatic lesion is seen.  However there is
dilatation of the intrahepatic ductal system.

IVC:  Visualized.

Pancreas:  The pancreas appears normal although the tail the
pancreas is obscured.  There is no evidence of dilatation of the
pancreatic duct.

Spleen:  The spleen is slightly prominent with a volume of 360 9
ml.

Right Kidney:  There is slight fullness of the right pelvocaliceal
system.  The right kidney measures 11.1 cm sagittally.

Left Kidney:  No hydronephrosis is seen on the left.  The left
kidney measures 10.2 cm.

Abdominal aorta:  The abdominal aortic wall is somewhat irregular
consistent with atheromatous change.  No focal dilatation is seen.
There is a tiny amount of ascites noted.
IMPRESSION: 1.  Dilatation of the intrahepatic and extrahepatic biliary ductal
system.  Consider CT of the abdomen pelvis to assess this finding
as well as to assess the mild hydronephrosis further. No definite
gallstones.
 2.  Mild right hydronephrosis.
 3.  Slightly prominent spleen.

## 2009-07-13 ENCOUNTER — Telehealth: Payer: Self-pay | Admitting: Family Medicine

## 2009-07-15 ENCOUNTER — Ambulatory Visit: Payer: Self-pay | Admitting: Internal Medicine

## 2009-07-18 ENCOUNTER — Telehealth: Payer: Self-pay | Admitting: Family Medicine

## 2009-07-18 ENCOUNTER — Encounter: Payer: Self-pay | Admitting: Family Medicine

## 2009-07-21 ENCOUNTER — Ambulatory Visit: Payer: Self-pay | Admitting: Internal Medicine

## 2009-07-22 ENCOUNTER — Encounter (INDEPENDENT_AMBULATORY_CARE_PROVIDER_SITE_OTHER): Payer: Self-pay | Admitting: *Deleted

## 2009-07-22 ENCOUNTER — Telehealth: Payer: Self-pay | Admitting: Internal Medicine

## 2009-07-22 ENCOUNTER — Encounter: Payer: Self-pay | Admitting: Family Medicine

## 2009-07-22 LAB — CONVERTED CEMR LAB
ALT: 12 units/L (ref 0–35)
AST: 15 units/L (ref 0–37)
Albumin: 2.9 g/dL — ABNORMAL LOW (ref 3.5–5.2)
Alkaline Phosphatase: 89 units/L (ref 39–117)
BUN: 23 mg/dL (ref 6–23)
Basophils Absolute: 0 10*3/uL (ref 0.0–0.1)
Basophils Relative: 0.3 % (ref 0.0–3.0)
CO2: 30 meq/L (ref 19–32)
Calcium: 7.9 mg/dL — ABNORMAL LOW (ref 8.4–10.5)
Chloride: 104 meq/L (ref 96–112)
Creatinine, Ser: 0.7 mg/dL (ref 0.4–1.2)
Eosinophils Absolute: 0.1 10*3/uL (ref 0.0–0.7)
Eosinophils Relative: 2.3 % (ref 0.0–5.0)
GFR calc non Af Amer: 93.98 mL/min (ref >60)
Glucose, Bld: 73 mg/dL (ref 70–99)
HCT: 28.3 % — ABNORMAL LOW (ref 36.0–46.0)
Hemoglobin: 9.2 g/dL — ABNORMAL LOW (ref 12.0–15.0)
Lymphocytes Relative: 12.8 % (ref 12.0–46.0)
Lymphs Abs: 0.7 10*3/uL (ref 0.7–4.0)
MCHC: 32.6 g/dL (ref 30.0–36.0)
MCV: 85.9 fL (ref 78.0–100.0)
Magnesium: 1.8 mg/dL (ref 1.5–2.5)
Monocytes Absolute: 0.5 10*3/uL (ref 0.1–1.0)
Monocytes Relative: 8.4 % (ref 3.0–12.0)
Neutro Abs: 4.4 10*3/uL (ref 1.4–7.7)
Neutrophils Relative %: 76.2 % (ref 43.0–77.0)
Platelets: 267 10*3/uL (ref 150.0–400.0)
Potassium: 4.7 meq/L (ref 3.5–5.1)
RBC: 3.29 M/uL — ABNORMAL LOW (ref 3.87–5.11)
RDW: 15.1 % — ABNORMAL HIGH (ref 11.5–14.6)
Sodium: 142 meq/L (ref 135–145)
Total Bilirubin: 0.4 mg/dL (ref 0.3–1.2)
Total Protein: 5.5 g/dL — ABNORMAL LOW (ref 6.0–8.3)
Vitamin B-12: 679 pg/mL (ref 211–911)
WBC: 5.8 10*3/uL (ref 4.5–10.5)

## 2009-08-04 ENCOUNTER — Telehealth: Payer: Self-pay | Admitting: Internal Medicine

## 2009-08-04 ENCOUNTER — Encounter (HOSPITAL_COMMUNITY): Admission: RE | Admit: 2009-08-04 | Discharge: 2009-11-02 | Payer: Self-pay | Admitting: Internal Medicine

## 2009-08-04 ENCOUNTER — Encounter: Payer: Self-pay | Admitting: Internal Medicine

## 2009-08-12 ENCOUNTER — Telehealth: Payer: Self-pay | Admitting: Family Medicine

## 2009-08-16 ENCOUNTER — Ambulatory Visit: Payer: Self-pay | Admitting: Internal Medicine

## 2009-08-18 ENCOUNTER — Telehealth: Payer: Self-pay | Admitting: Family Medicine

## 2009-08-22 ENCOUNTER — Ambulatory Visit: Payer: Self-pay | Admitting: Internal Medicine

## 2009-08-22 LAB — CONVERTED CEMR LAB
Basophils Absolute: 0.1 10*3/uL (ref 0.0–0.1)
Basophils Relative: 0.7 % (ref 0.0–3.0)
Eosinophils Absolute: 0.2 10*3/uL (ref 0.0–0.7)
Eosinophils Relative: 2.4 % (ref 0.0–5.0)
HCT: 29.7 % — ABNORMAL LOW (ref 36.0–46.0)
Hemoglobin: 10 g/dL — ABNORMAL LOW (ref 12.0–15.0)
Lymphocytes Relative: 8.8 % — ABNORMAL LOW (ref 12.0–46.0)
Lymphs Abs: 0.7 10*3/uL (ref 0.7–4.0)
MCHC: 33.8 g/dL (ref 30.0–36.0)
MCV: 87.1 fL (ref 78.0–100.0)
Monocytes Absolute: 0.7 10*3/uL (ref 0.1–1.0)
Monocytes Relative: 8 % (ref 3.0–12.0)
Neutro Abs: 6.5 10*3/uL (ref 1.4–7.7)
Neutrophils Relative %: 80.1 % — ABNORMAL HIGH (ref 43.0–77.0)
Platelets: 277 10*3/uL (ref 150.0–400.0)
RBC: 3.41 M/uL — ABNORMAL LOW (ref 3.87–5.11)
RDW: 15.8 % — ABNORMAL HIGH (ref 11.5–14.6)
WBC: 8.1 10*3/uL (ref 4.5–10.5)

## 2009-09-07 ENCOUNTER — Ambulatory Visit: Payer: Self-pay | Admitting: Family Medicine

## 2009-09-15 ENCOUNTER — Telehealth (INDEPENDENT_AMBULATORY_CARE_PROVIDER_SITE_OTHER): Payer: Self-pay | Admitting: *Deleted

## 2009-09-15 ENCOUNTER — Ambulatory Visit: Payer: Self-pay | Admitting: Internal Medicine

## 2009-09-15 ENCOUNTER — Encounter: Payer: Self-pay | Admitting: Family Medicine

## 2009-09-20 ENCOUNTER — Ambulatory Visit: Payer: Self-pay | Admitting: Internal Medicine

## 2009-09-21 ENCOUNTER — Telehealth: Payer: Self-pay | Admitting: Internal Medicine

## 2009-09-21 LAB — CONVERTED CEMR LAB
ALT: 11 units/L (ref 0–35)
AST: 16 units/L (ref 0–37)
Albumin: 3 g/dL — ABNORMAL LOW (ref 3.5–5.2)
Alkaline Phosphatase: 78 units/L (ref 39–117)
BUN: 17 mg/dL (ref 6–23)
Basophils Absolute: 0 10*3/uL (ref 0.0–0.1)
Basophils Relative: 0.7 % (ref 0.0–3.0)
CO2: 30 meq/L (ref 19–32)
Calcium: 8.1 mg/dL — ABNORMAL LOW (ref 8.4–10.5)
Chloride: 103 meq/L (ref 96–112)
Creatinine, Ser: 0.6 mg/dL (ref 0.4–1.2)
Eosinophils Absolute: 0.1 10*3/uL (ref 0.0–0.7)
Eosinophils Relative: 1.4 % (ref 0.0–5.0)
GFR calc non Af Amer: 113.19 mL/min (ref >60)
Glucose, Bld: 70 mg/dL (ref 70–99)
HCT: 29.6 % — ABNORMAL LOW (ref 36.0–46.0)
Hemoglobin: 9.7 g/dL — ABNORMAL LOW (ref 12.0–15.0)
Iron: 25 ug/dL — ABNORMAL LOW (ref 42–145)
Lymphocytes Relative: 8.7 % — ABNORMAL LOW (ref 12.0–46.0)
Lymphs Abs: 0.6 10*3/uL — ABNORMAL LOW (ref 0.7–4.0)
MCHC: 32.8 g/dL (ref 30.0–36.0)
MCV: 85.9 fL (ref 78.0–100.0)
Monocytes Absolute: 0.7 10*3/uL (ref 0.1–1.0)
Monocytes Relative: 10.2 % (ref 3.0–12.0)
Neutro Abs: 5.3 10*3/uL (ref 1.4–7.7)
Neutrophils Relative %: 79 % — ABNORMAL HIGH (ref 43.0–77.0)
Platelets: 330 10*3/uL (ref 150.0–400.0)
Potassium: 5.2 meq/L — ABNORMAL HIGH (ref 3.5–5.1)
RBC: 3.44 M/uL — ABNORMAL LOW (ref 3.87–5.11)
RDW: 14.7 % — ABNORMAL HIGH (ref 11.5–14.6)
Saturation Ratios: 10.3 % — ABNORMAL LOW (ref 20.0–50.0)
Sodium: 138 meq/L (ref 135–145)
Total Bilirubin: 0.3 mg/dL (ref 0.3–1.2)
Total Protein: 6.2 g/dL (ref 6.0–8.3)
Transferrin: 173.9 mg/dL — ABNORMAL LOW (ref 212.0–360.0)
WBC: 6.7 10*3/uL (ref 4.5–10.5)

## 2009-09-26 ENCOUNTER — Telehealth: Payer: Self-pay | Admitting: Family Medicine

## 2009-10-14 ENCOUNTER — Encounter: Payer: Self-pay | Admitting: Family Medicine

## 2009-10-17 ENCOUNTER — Ambulatory Visit: Payer: Self-pay | Admitting: Internal Medicine

## 2009-10-17 DIAGNOSIS — Z87312 Personal history of (healed) stress fracture: Secondary | ICD-10-CM

## 2009-10-17 HISTORY — DX: Personal history of (healed) stress fracture: Z87.312

## 2009-10-18 ENCOUNTER — Telehealth: Payer: Self-pay | Admitting: Family Medicine

## 2009-10-19 ENCOUNTER — Ambulatory Visit (HOSPITAL_COMMUNITY): Admission: RE | Admit: 2009-10-19 | Discharge: 2009-10-19 | Payer: Self-pay | Admitting: General Surgery

## 2009-10-31 ENCOUNTER — Encounter: Payer: Self-pay | Admitting: Pulmonary Disease

## 2009-11-01 ENCOUNTER — Ambulatory Visit: Payer: Self-pay | Admitting: Cardiology

## 2009-11-01 ENCOUNTER — Ambulatory Visit: Payer: Self-pay | Admitting: Pulmonary Disease

## 2009-11-01 ENCOUNTER — Encounter: Payer: Self-pay | Admitting: Internal Medicine

## 2009-11-03 ENCOUNTER — Ambulatory Visit: Payer: Self-pay | Admitting: Cardiology

## 2009-11-08 ENCOUNTER — Ambulatory Visit (HOSPITAL_COMMUNITY): Admission: RE | Admit: 2009-11-08 | Discharge: 2009-11-08 | Payer: Self-pay | Admitting: Cardiology

## 2009-11-08 ENCOUNTER — Ambulatory Visit: Payer: Self-pay | Admitting: Cardiology

## 2009-11-08 ENCOUNTER — Encounter: Payer: Self-pay | Admitting: Internal Medicine

## 2009-11-09 ENCOUNTER — Ambulatory Visit: Payer: Self-pay | Admitting: Cardiology

## 2009-11-10 ENCOUNTER — Encounter: Payer: Self-pay | Admitting: Family Medicine

## 2009-11-14 ENCOUNTER — Encounter: Payer: Self-pay | Admitting: Family Medicine

## 2009-11-14 ENCOUNTER — Ambulatory Visit: Payer: Self-pay | Admitting: Cardiology

## 2009-11-15 ENCOUNTER — Ambulatory Visit: Payer: Self-pay | Admitting: Family Medicine

## 2009-11-16 ENCOUNTER — Ambulatory Visit: Payer: Self-pay | Admitting: Cardiology

## 2009-11-16 ENCOUNTER — Ambulatory Visit: Payer: Self-pay | Admitting: Pulmonary Disease

## 2009-11-16 ENCOUNTER — Ambulatory Visit: Payer: Self-pay | Admitting: Family Medicine

## 2009-11-16 ENCOUNTER — Inpatient Hospital Stay (HOSPITAL_BASED_OUTPATIENT_CLINIC_OR_DEPARTMENT_OTHER): Admission: RE | Admit: 2009-11-16 | Discharge: 2009-11-16 | Payer: Self-pay | Admitting: Cardiology

## 2009-11-17 ENCOUNTER — Ambulatory Visit: Payer: Self-pay | Admitting: Internal Medicine

## 2009-11-17 DIAGNOSIS — I5022 Chronic systolic (congestive) heart failure: Secondary | ICD-10-CM | POA: Insufficient documentation

## 2009-11-17 DIAGNOSIS — I420 Dilated cardiomyopathy: Secondary | ICD-10-CM | POA: Insufficient documentation

## 2009-11-17 DIAGNOSIS — I447 Left bundle-branch block, unspecified: Secondary | ICD-10-CM | POA: Insufficient documentation

## 2009-11-17 DIAGNOSIS — I4892 Unspecified atrial flutter: Secondary | ICD-10-CM | POA: Insufficient documentation

## 2009-11-18 ENCOUNTER — Ambulatory Visit: Payer: Self-pay | Admitting: Cardiology

## 2009-11-23 ENCOUNTER — Ambulatory Visit: Payer: Self-pay | Admitting: Cardiology

## 2009-11-29 ENCOUNTER — Telehealth (INDEPENDENT_AMBULATORY_CARE_PROVIDER_SITE_OTHER): Payer: Self-pay | Admitting: *Deleted

## 2009-11-30 ENCOUNTER — Encounter: Payer: Self-pay | Admitting: Internal Medicine

## 2009-12-01 ENCOUNTER — Ambulatory Visit: Payer: Self-pay | Admitting: Internal Medicine

## 2009-12-06 ENCOUNTER — Ambulatory Visit: Payer: Self-pay | Admitting: Cardiology

## 2009-12-12 ENCOUNTER — Encounter: Payer: Self-pay | Admitting: Family Medicine

## 2009-12-12 ENCOUNTER — Telehealth (INDEPENDENT_AMBULATORY_CARE_PROVIDER_SITE_OTHER): Payer: Self-pay | Admitting: *Deleted

## 2009-12-16 ENCOUNTER — Ambulatory Visit (HOSPITAL_COMMUNITY): Admission: RE | Admit: 2009-12-16 | Discharge: 2009-12-16 | Payer: Self-pay | Admitting: General Surgery

## 2009-12-16 HISTORY — PX: PORTACATH PLACEMENT: SHX2246

## 2009-12-19 ENCOUNTER — Ambulatory Visit: Payer: Self-pay | Admitting: Internal Medicine

## 2009-12-19 ENCOUNTER — Telehealth (INDEPENDENT_AMBULATORY_CARE_PROVIDER_SITE_OTHER): Payer: Self-pay | Admitting: *Deleted

## 2009-12-19 ENCOUNTER — Telehealth: Payer: Self-pay | Admitting: Internal Medicine

## 2009-12-19 ENCOUNTER — Telehealth: Payer: Self-pay | Admitting: Family Medicine

## 2009-12-19 LAB — CONVERTED CEMR LAB
Basophils Absolute: 0 10*3/uL (ref 0.0–0.1)
Basophils Absolute: 0 10*3/uL (ref 0.0–0.1)
Basophils Relative: 0.3 % (ref 0.0–3.0)
Basophils Relative: 0.2 % (ref 0.0–3.0)
Eosinophils Absolute: 0.1 10*3/uL (ref 0.0–0.7)
Eosinophils Absolute: 0.5 10*3/uL (ref 0.0–0.7)
Eosinophils Relative: 1.3 % (ref 0.0–5.0)
Eosinophils Relative: 4.8 % (ref 0.0–5.0)
HCT: 29.6 % — ABNORMAL LOW (ref 36.0–46.0)
HCT: 33.9 % — ABNORMAL LOW (ref 36.0–46.0)
Hemoglobin: 10.1 g/dL — ABNORMAL LOW (ref 12.0–15.0)
Hemoglobin: 11.5 g/dL — ABNORMAL LOW (ref 12.0–15.0)
Iron: 55 ug/dL (ref 42–145)
Lymphocytes Relative: 13.8 % (ref 12.0–46.0)
Lymphocytes Relative: 8.2 % — ABNORMAL LOW (ref 12.0–46.0)
Lymphs Abs: 0.9 10*3/uL (ref 0.7–4.0)
Lymphs Abs: 0.8 10*3/uL (ref 0.7–4.0)
MCHC: 34 g/dL (ref 30.0–36.0)
MCHC: 34 g/dL (ref 30.0–36.0)
MCV: 85.9 fL (ref 78.0–100.0)
MCV: 89.1 fL (ref 78.0–100.0)
Monocytes Absolute: 0.6 10*3/uL (ref 0.1–1.0)
Monocytes Absolute: 0.5 10*3/uL (ref 0.1–1.0)
Monocytes Relative: 10.4 % (ref 3.0–12.0)
Monocytes Relative: 4.9 % (ref 3.0–12.0)
Neutro Abs: 4.6 10*3/uL (ref 1.4–7.7)
Neutro Abs: 8.1 10*3/uL — ABNORMAL HIGH (ref 1.4–7.7)
Neutrophils Relative %: 74.2 % (ref 43.0–77.0)
Neutrophils Relative %: 81.9 % — ABNORMAL HIGH (ref 43.0–77.0)
Platelets: 270 10*3/uL (ref 150.0–400.0)
Platelets: 256 10*3/uL (ref 150.0–400.0)
RBC: 3.45 M/uL — ABNORMAL LOW (ref 3.87–5.11)
RBC: 3.81 M/uL — ABNORMAL LOW (ref 3.87–5.11)
RDW: 15.4 % — ABNORMAL HIGH (ref 11.5–14.6)
RDW: 15.4 % — ABNORMAL HIGH (ref 11.5–14.6)
Saturation Ratios: 22.9 % (ref 20.0–50.0)
Transferrin: 171.6 mg/dL — ABNORMAL LOW (ref 212.0–360.0)
WBC: 6.2 10*3/uL (ref 4.5–10.5)
WBC: 9.8 10*3/uL (ref 4.5–10.5)

## 2009-12-20 ENCOUNTER — Encounter: Payer: Self-pay | Admitting: Internal Medicine

## 2009-12-21 ENCOUNTER — Inpatient Hospital Stay (HOSPITAL_COMMUNITY): Admission: RE | Admit: 2009-12-21 | Discharge: 2009-12-22 | Payer: Self-pay | Admitting: Internal Medicine

## 2009-12-21 ENCOUNTER — Ambulatory Visit: Payer: Self-pay | Admitting: Internal Medicine

## 2009-12-21 HISTORY — PX: PACEMAKER PLACEMENT: SHX43

## 2009-12-21 IMAGING — CT CT ENTERO PELVIS W/CM
2 of 5 series · 16 of 46 positions shown, 18 images · IV contrast (Omnipaque 300)
Comparison: CT of the chest of 09/01/2008

CT ABDOMEN:

CLINICAL DATA: Chronic small bowel obstruction, nausea and
vomiting weight loss

CT ABDOMEN AND PELVIS WITH CONTRAST (CT ENTEROGRAPHY)
TECHNIQUE: Multidetector CT of the abdomen and pelvis during bolus
administration of intravenous contrast. Negative oral contrast
VoLumen was given.
Contrast: 70 ml Vmnipaque-ZMM intravenously and 5620 ml VoLumen
orally.

[Series 2: enterography standard · axial · 0.63mm/px · z∈[-414,-74]mm · 13 of 80 slices shown, 15 images]
[im 6/80  soft-tissue]
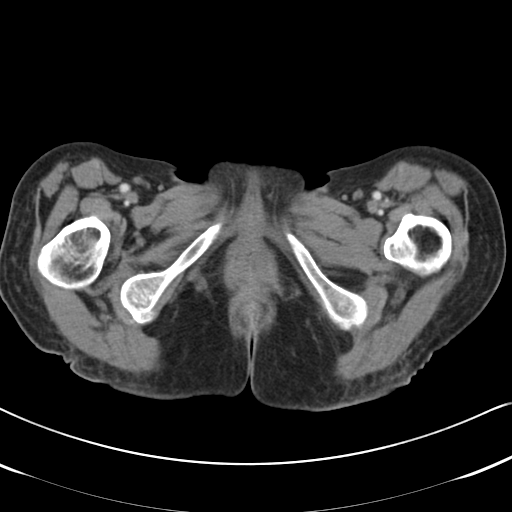
[im 6/80  bone]
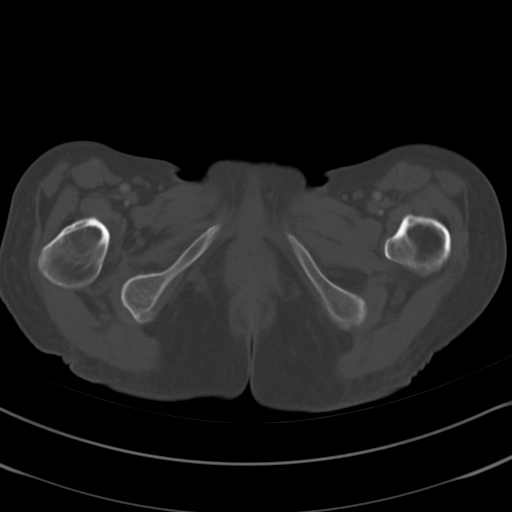
[im 12/80  soft-tissue]
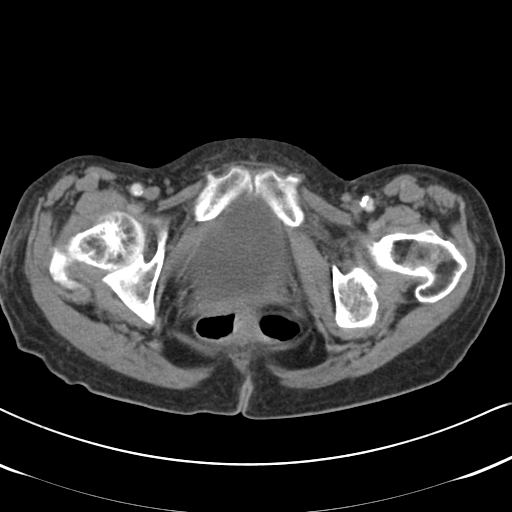
[im 17/80  soft-tissue]
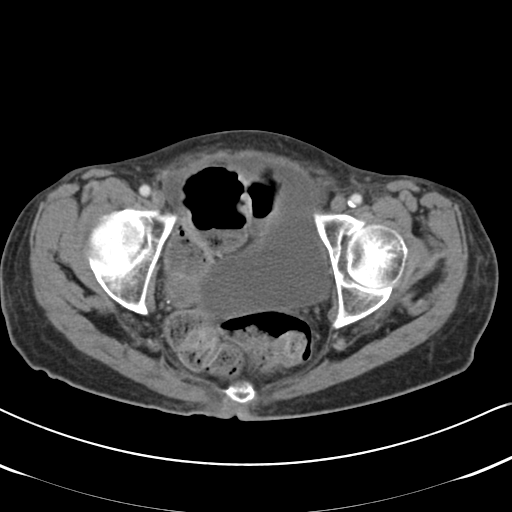
[im 23/80  soft-tissue]
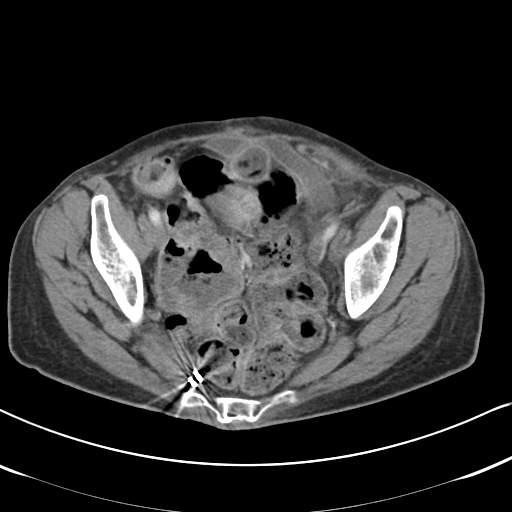
[im 29/80  soft-tissue]
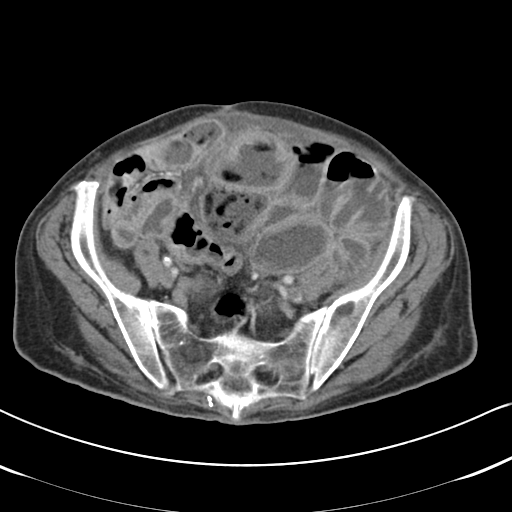
[im 34/80  soft-tissue]
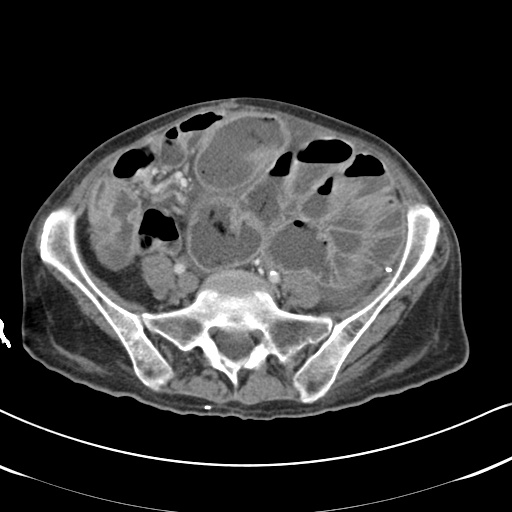
[im 40/80  soft-tissue]
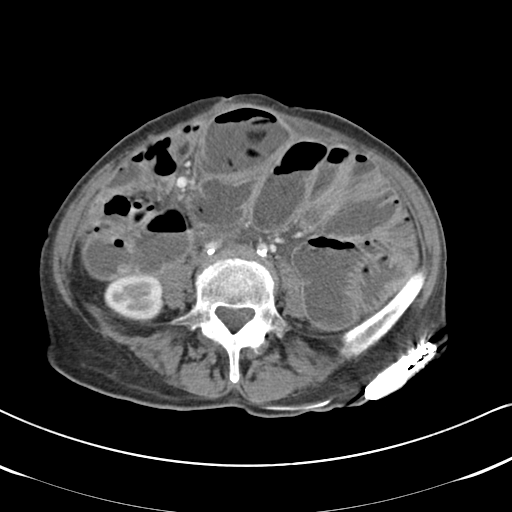
[im 46/80  soft-tissue]
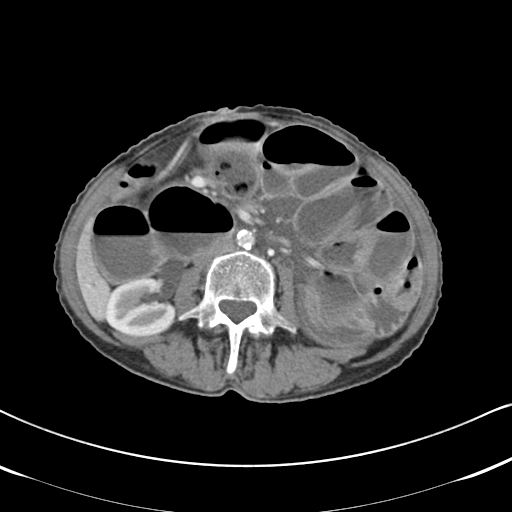
[im 51/80  soft-tissue]
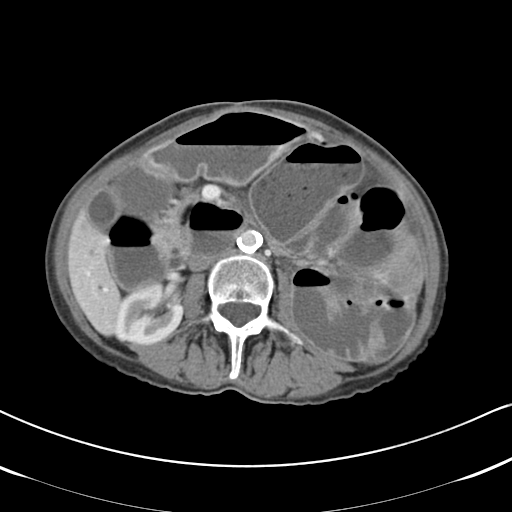
[im 51/80  bone]
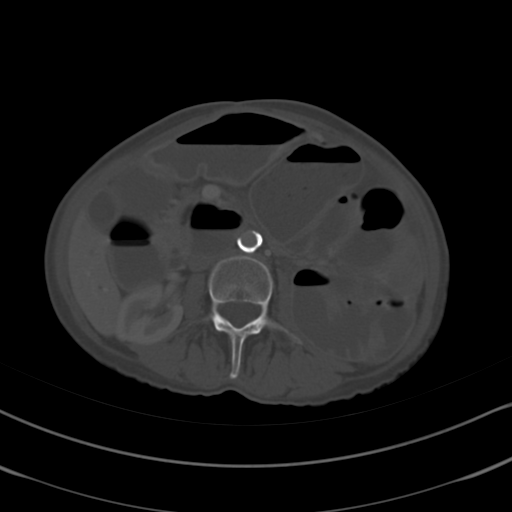
[im 57/80  soft-tissue]
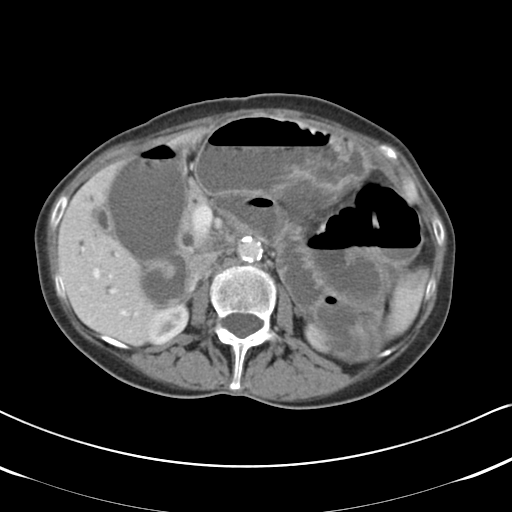
[im 63/80  soft-tissue]
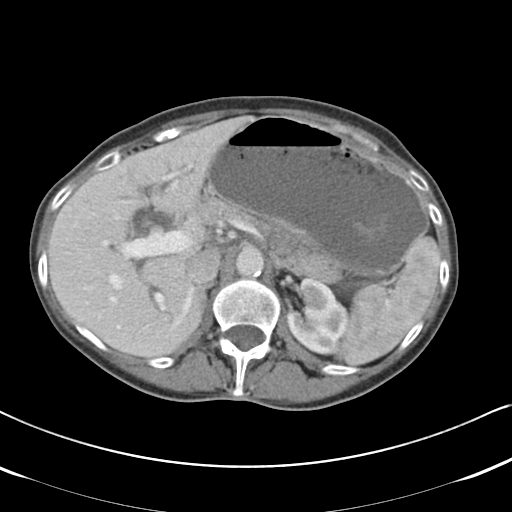
[im 68/80  soft-tissue]
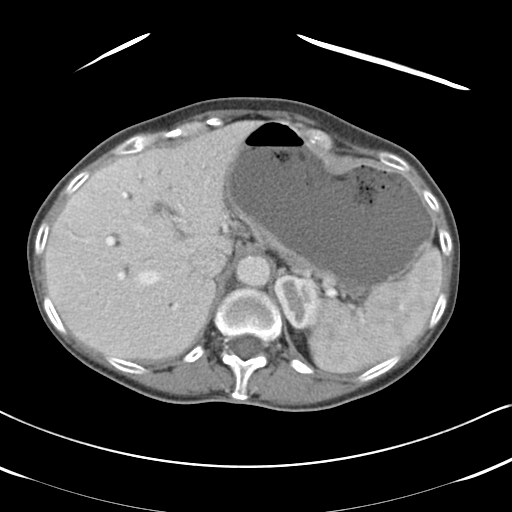
[im 74/80  soft-tissue]
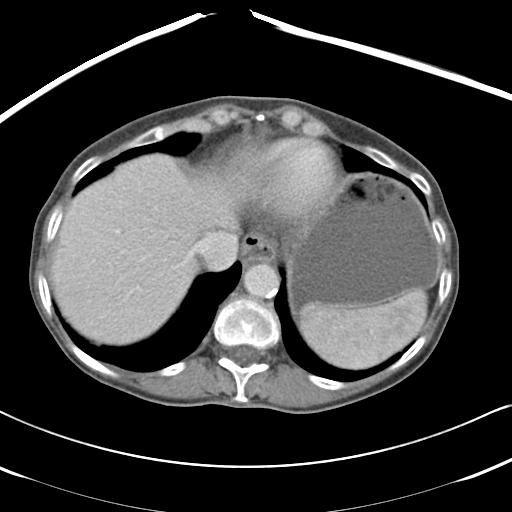

[Series 602: <mpr range> · coronal · 0.79mm/px · 3 of 103 slices shown]
[im 35/103  soft-tissue]
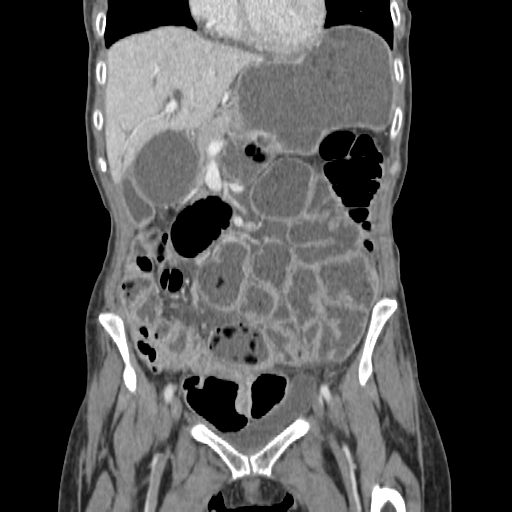
[im 46/103  soft-tissue]
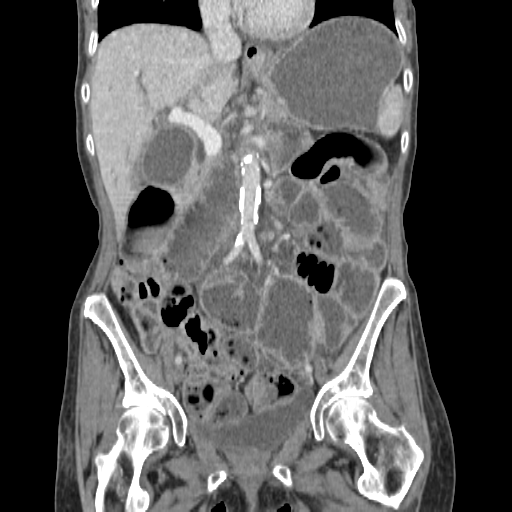
[im 57/103  soft-tissue]
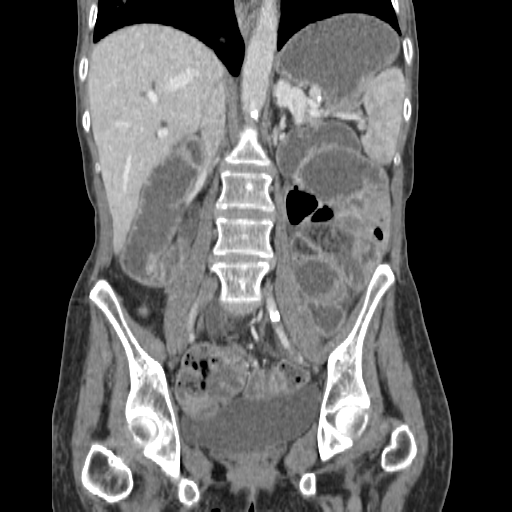

[16 of 46 positions shown; findings below may reference images not displayed]

FINDINGS: Compared to the CT of the chest, there are vague
opacities remaining particularly in the right lower lobe probably
representing residua from that presumed inflammatory process.  The
liver enhances and there are prominent central intrahepatic ducts.
No definite calcified gallstones are seen.  The common bile duct is
seen to the region of the ampulla with no definite mass or calculus
evident.  Correlation with liver function tests is recommended.
Distal common bile duct stricture cannot be excluded.  The pancreas
is normal in size and the pancreatic duct is not dilated.  The
adrenal glands and spleen are unremarkable.  The kidneys enhance
with no calculus, mass, or hydronephrosis.  The abdominal aorta is
normal in caliber with atheromatous change.  However, there is
considerable fluid distention of the stomach and proximal small
bowel consistent with partial small bowel obstruction.  It does
appear that the patient has previously undergone left
hemicolectomy.  The reason for that prior surgery is not given by
today's history.
IMPRESSION: 1.  Partial small bowel obstruction.  CT of the pelvis is to be
performed.
2.  Left hemicolectomy.  Etiology unknown.
3.  Dilated intrahepatic ducts and somewhat prominent common bile
duct without definite mass or calculus.  Cannot exclude distal
common bile duct stricture.  Correlate with liver function tests.

CT PELVIS:
FINDINGS: Dilated loops of small bowel continue into the pelvis.
There is some fecalization of these dilated distal small bowel
loops which indicates obstruction and/or delayed transit.  The
distal ileum does appear to be decompressed, but no definite point
of small bowel obstruction is seen.  Adhesions would be the primary
consideration with no mass evident and no mucosal enhancement
noted.  Correlation with the patient's prior surgeries would be
helpful, which is not given by history today.  The urinary bladder
is unremarkable.  No free fluid is seen within the pelvis.  The
patient has previously undergone hysterectomy.  No pelvic mass is
seen.  No bony abnormality is seen.
IMPRESSION: 1.  Partial small bowel obstruction without definite point of
caliber change.  Favor adhesion as the etiology.  Correlation with
the patient's prior bowel surgery is recommended.
2.  Fecalization of distal small bowel loops consistent with
obstruction or delayed transit.

## 2009-12-24 ENCOUNTER — Encounter (INDEPENDENT_AMBULATORY_CARE_PROVIDER_SITE_OTHER): Payer: Self-pay | Admitting: Nephrology

## 2009-12-24 ENCOUNTER — Ambulatory Visit: Payer: Self-pay | Admitting: Pulmonary Disease

## 2009-12-25 ENCOUNTER — Ambulatory Visit: Payer: Self-pay | Admitting: Internal Medicine

## 2009-12-28 ENCOUNTER — Encounter: Payer: Self-pay | Admitting: Family Medicine

## 2009-12-28 ENCOUNTER — Encounter: Payer: Self-pay | Admitting: Internal Medicine

## 2009-12-28 ENCOUNTER — Telehealth: Payer: Self-pay | Admitting: Family Medicine

## 2009-12-29 ENCOUNTER — Encounter: Payer: Self-pay | Admitting: Internal Medicine

## 2009-12-29 ENCOUNTER — Ambulatory Visit: Payer: Self-pay

## 2010-01-03 ENCOUNTER — Encounter: Payer: Self-pay | Admitting: Internal Medicine

## 2010-01-04 ENCOUNTER — Encounter: Payer: Self-pay | Admitting: Internal Medicine

## 2010-01-04 IMAGING — RF DG BE LIMITED  W/ CM
11 series · 15 of 15 positions shown · IV contrast (agent unspecified)
Comparison: CT abdomen and pelvis 02/22/2009 and barium enema
01/08/2007.

CLINICAL DATA: Patient history of near total colectomy with repeated
bowel obstructions.

SINGLE CONTRAST BARIUM ENEMA
Fluoroscopy Time: 1.8 minutes
Contrast: Barium

[Series 1: run · 2 of 2 slices shown (1 of 11)]
[im 1/2]
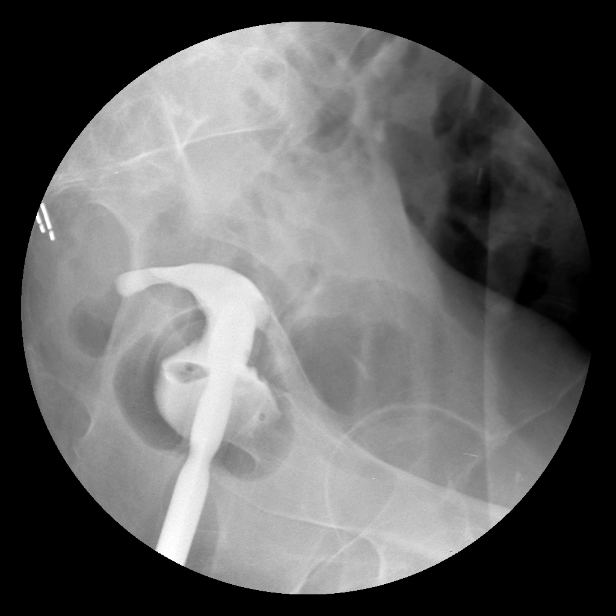
[im 2/2]
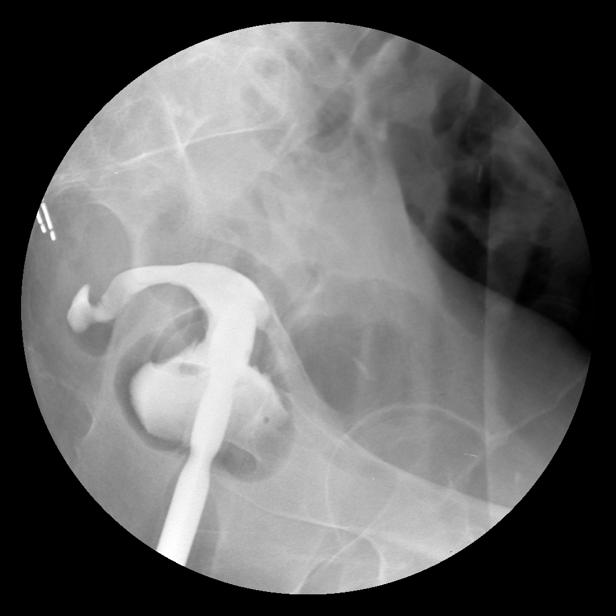

[Series 2: run · 1 of 1 slices shown (2 of 11)]
[im 1/1]
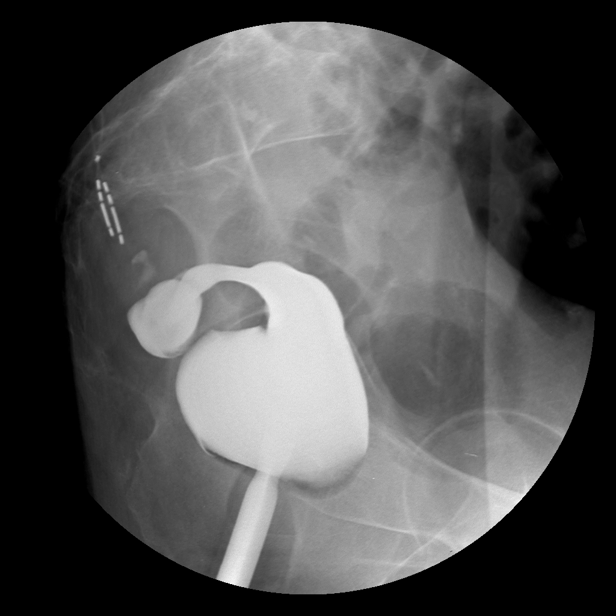

[Series 3: run · 2 of 2 slices shown (3 of 11)]
[im 1/2]
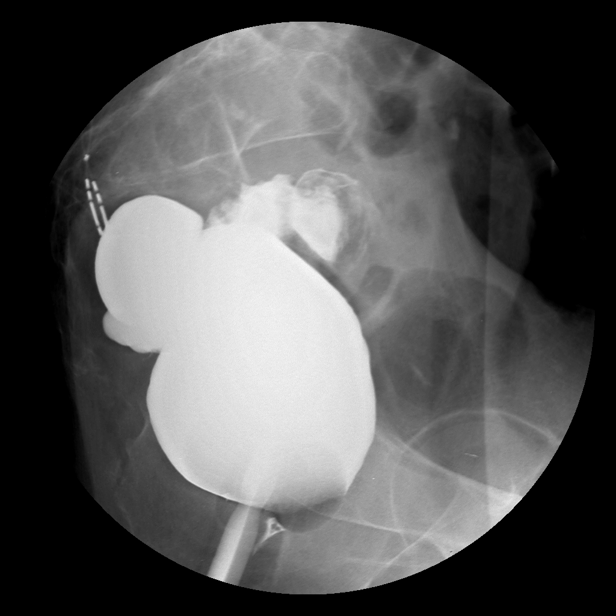
[im 2/2]
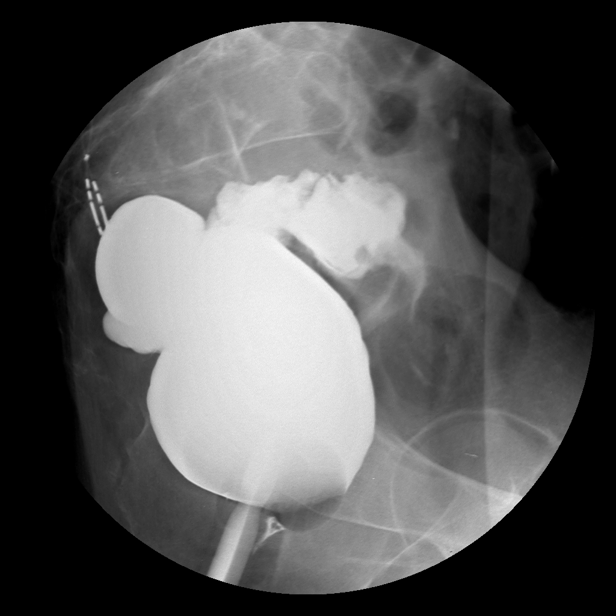

[Series 4: run · 2 of 2 slices shown (4 of 11)]
[im 1/2]
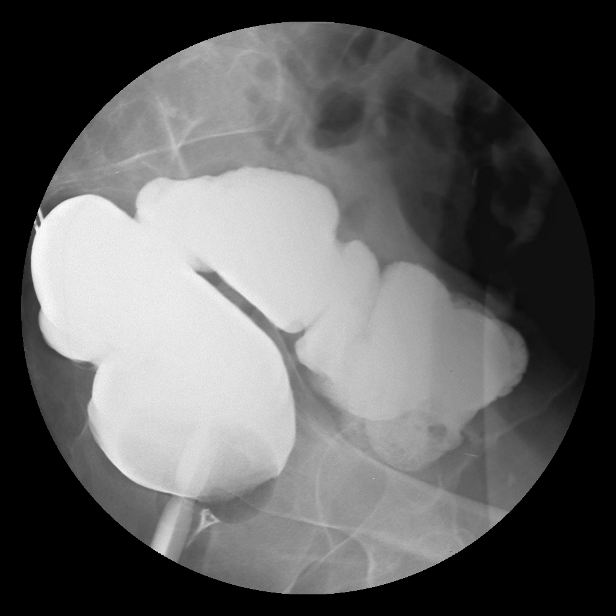
[im 2/2]
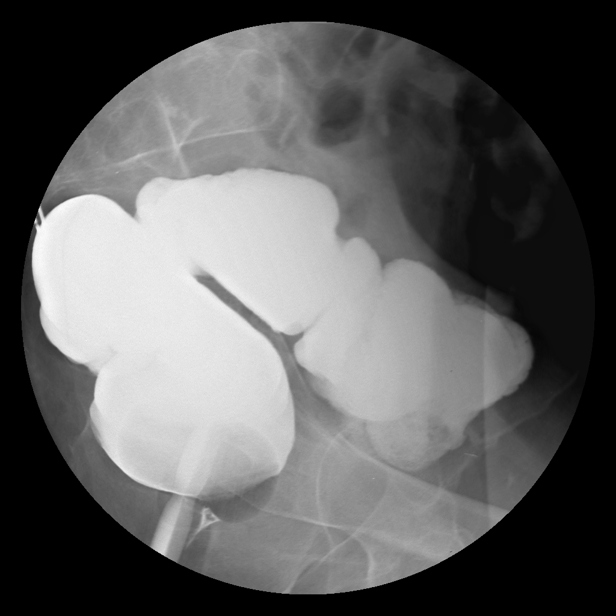

[Series 5: run · 1 of 1 slices shown (5 of 11)]
[im 1/1]
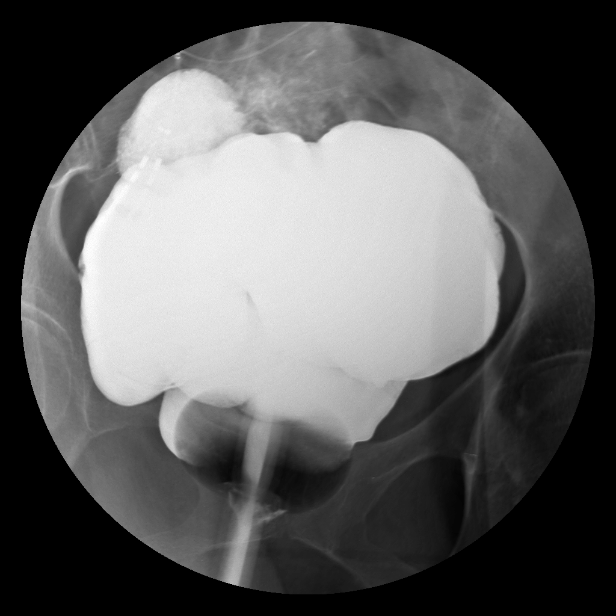

[Series 6: run · 2 of 2 slices shown (6 of 11)]
[im 1/2]
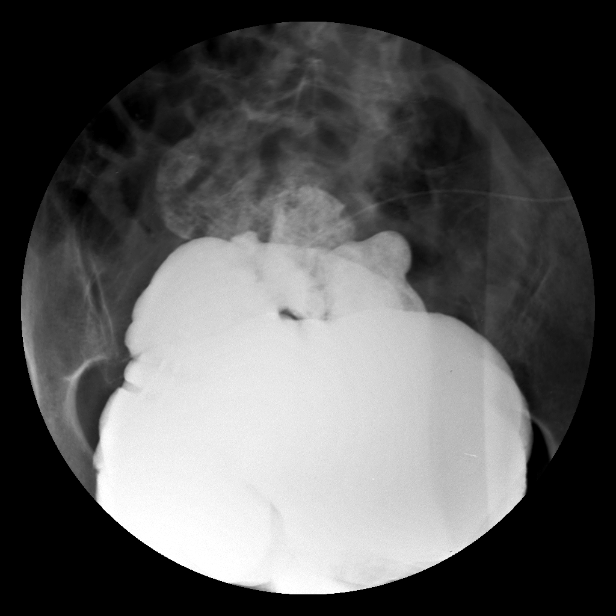
[im 2/2]
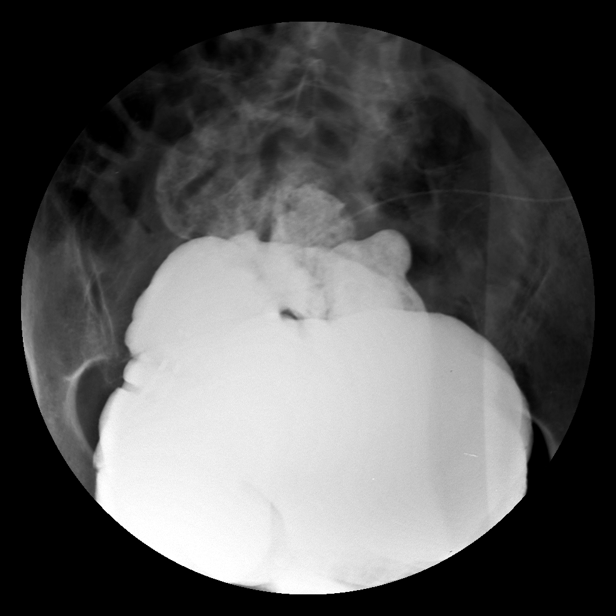

[Series 7: run · 1 of 1 slices shown (7 of 11)]
[im 1/1]
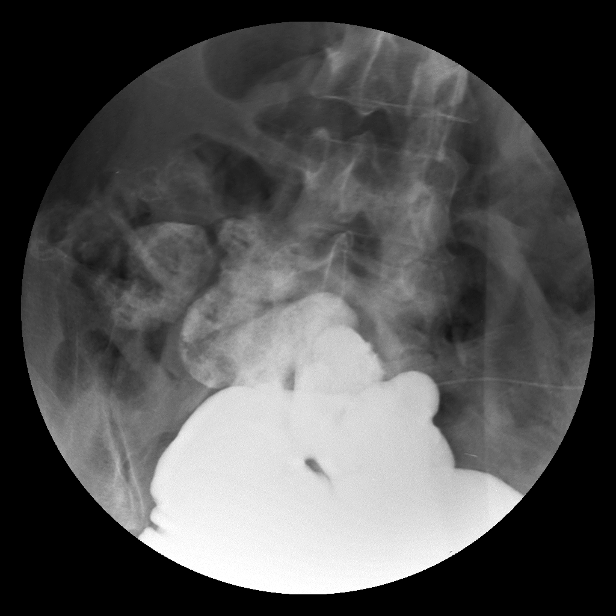

[Series 8: run · 1 of 1 slices shown (8 of 11)]
[im 1/1]
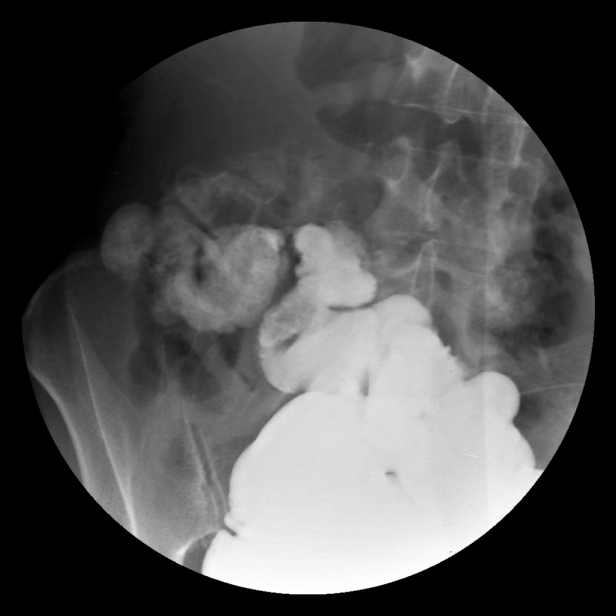

[Series 9: run · 1 of 1 slices shown (9 of 11)]
[im 1/1]
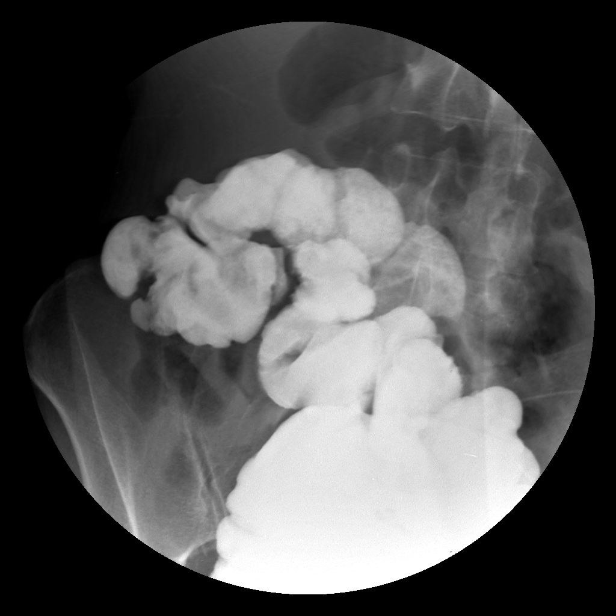

[Series 10: run · 1 of 1 slices shown (10 of 11)]
[im 1/1]
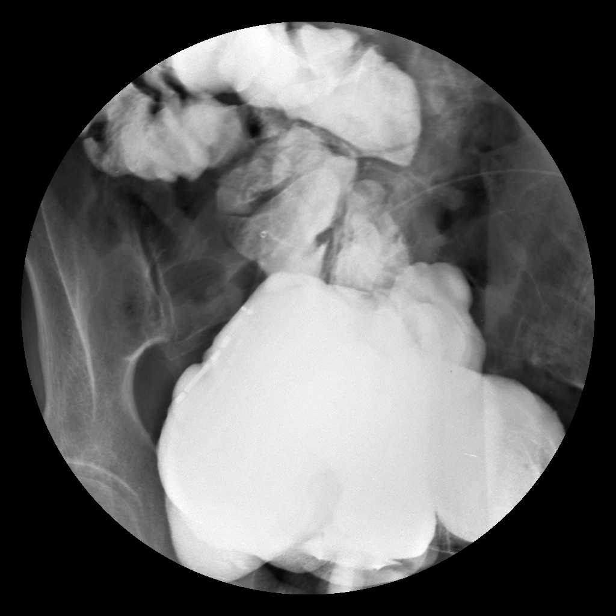

[Series 11: run · 1 of 1 slices shown (11 of 11)]
[im 1/1]
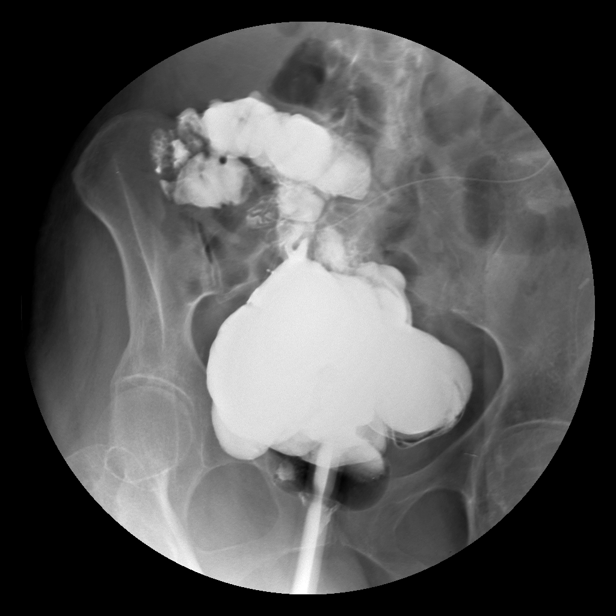

[15 of 15 positions shown; findings below may reference images not displayed]

FINDINGS: Scout film demonstrates an unremarkable bowel gas
pattern.  Pacing device in the urinary bladder is noted.  There is
some bilateral hip degenerative change.

Barium was instilled under gravity into the rectum.  Contrast
material flowed into small bowel.  No evidence of obstruction.  No
leak.
IMPRESSION: Negative for stricture in patient status post near total colectomy.

## 2010-01-05 ENCOUNTER — Telehealth: Payer: Self-pay | Admitting: Internal Medicine

## 2010-01-06 ENCOUNTER — Encounter (INDEPENDENT_AMBULATORY_CARE_PROVIDER_SITE_OTHER): Payer: Self-pay | Admitting: *Deleted

## 2010-01-06 ENCOUNTER — Ambulatory Visit: Payer: Self-pay | Admitting: Internal Medicine

## 2010-01-06 ENCOUNTER — Inpatient Hospital Stay (HOSPITAL_COMMUNITY): Admission: EM | Admit: 2010-01-06 | Discharge: 2010-01-10 | Payer: Self-pay | Admitting: Emergency Medicine

## 2010-01-06 ENCOUNTER — Ambulatory Visit: Payer: Self-pay | Admitting: Family Medicine

## 2010-01-06 DIAGNOSIS — K5289 Other specified noninfective gastroenteritis and colitis: Secondary | ICD-10-CM | POA: Insufficient documentation

## 2010-01-06 DIAGNOSIS — E860 Dehydration: Secondary | ICD-10-CM | POA: Insufficient documentation

## 2010-01-07 ENCOUNTER — Encounter: Payer: Self-pay | Admitting: Internal Medicine

## 2010-01-07 ENCOUNTER — Ambulatory Visit: Payer: Self-pay | Admitting: Vascular Surgery

## 2010-01-10 ENCOUNTER — Encounter: Payer: Self-pay | Admitting: Family Medicine

## 2010-01-17 ENCOUNTER — Encounter: Payer: Self-pay | Admitting: Internal Medicine

## 2010-01-23 ENCOUNTER — Ambulatory Visit: Payer: Self-pay | Admitting: Internal Medicine

## 2010-01-24 ENCOUNTER — Encounter: Payer: Self-pay | Admitting: Internal Medicine

## 2010-01-24 ENCOUNTER — Ambulatory Visit: Payer: Self-pay | Admitting: Cardiovascular Disease

## 2010-01-24 LAB — CONVERTED CEMR LAB
Basophils Absolute: 0.1 10*3/uL (ref 0.0–0.1)
Basophils Relative: 0.8 % (ref 0.0–3.0)
Eosinophils Absolute: 0.2 10*3/uL (ref 0.0–0.7)
Eosinophils Relative: 3.2 % (ref 0.0–5.0)
HCT: 32.1 % — ABNORMAL LOW (ref 36.0–46.0)
Hemoglobin: 10.9 g/dL — ABNORMAL LOW (ref 12.0–15.0)
Iron: 27 ug/dL — ABNORMAL LOW (ref 42–145)
Lymphocytes Relative: 12.1 % (ref 12.0–46.0)
Lymphs Abs: 0.9 10*3/uL (ref 0.7–4.0)
MCHC: 34 g/dL (ref 30.0–36.0)
MCV: 89.2 fL (ref 78.0–100.0)
Monocytes Absolute: 0.5 10*3/uL (ref 0.1–1.0)
Monocytes Relative: 6.4 % (ref 3.0–12.0)
Neutro Abs: 5.5 10*3/uL (ref 1.4–7.7)
Neutrophils Relative %: 77.5 % — ABNORMAL HIGH (ref 43.0–77.0)
Platelets: 258 10*3/uL (ref 150.0–400.0)
RBC: 3.6 M/uL — ABNORMAL LOW (ref 3.87–5.11)
RDW: 15.5 % — ABNORMAL HIGH (ref 11.5–14.6)
Saturation Ratios: 12.3 % — ABNORMAL LOW (ref 20.0–50.0)
Transferrin: 156.6 mg/dL — ABNORMAL LOW (ref 212.0–360.0)
WBC: 7.2 10*3/uL (ref 4.5–10.5)

## 2010-01-25 ENCOUNTER — Telehealth: Payer: Self-pay | Admitting: Internal Medicine

## 2010-01-30 ENCOUNTER — Telehealth: Payer: Self-pay | Admitting: Family Medicine

## 2010-02-01 ENCOUNTER — Telehealth: Payer: Self-pay | Admitting: Internal Medicine

## 2010-02-02 ENCOUNTER — Ambulatory Visit: Payer: Self-pay | Admitting: Pulmonary Disease

## 2010-02-02 DIAGNOSIS — R0609 Other forms of dyspnea: Secondary | ICD-10-CM | POA: Insufficient documentation

## 2010-02-02 DIAGNOSIS — R0989 Other specified symptoms and signs involving the circulatory and respiratory systems: Secondary | ICD-10-CM | POA: Insufficient documentation

## 2010-02-03 ENCOUNTER — Ambulatory Visit (HOSPITAL_COMMUNITY): Admission: RE | Admit: 2010-02-03 | Discharge: 2010-02-03 | Payer: Self-pay | Admitting: Internal Medicine

## 2010-02-03 ENCOUNTER — Ambulatory Visit: Payer: Self-pay | Admitting: Internal Medicine

## 2010-02-06 ENCOUNTER — Telehealth: Payer: Self-pay | Admitting: Internal Medicine

## 2010-02-07 ENCOUNTER — Telehealth (INDEPENDENT_AMBULATORY_CARE_PROVIDER_SITE_OTHER): Payer: Self-pay | Admitting: *Deleted

## 2010-02-11 ENCOUNTER — Encounter: Payer: Self-pay | Admitting: Pulmonary Disease

## 2010-02-14 ENCOUNTER — Encounter: Payer: Self-pay | Admitting: Internal Medicine

## 2010-02-17 IMAGING — CR DG CHEST 2V
2 series · 2 of 2 positions shown · non-contrast
Comparison: 09/16/2008.

CLINICAL DATA: Bowel obstruction.

CHEST - 2 VIEW

[view not recorded (1 of 2)]
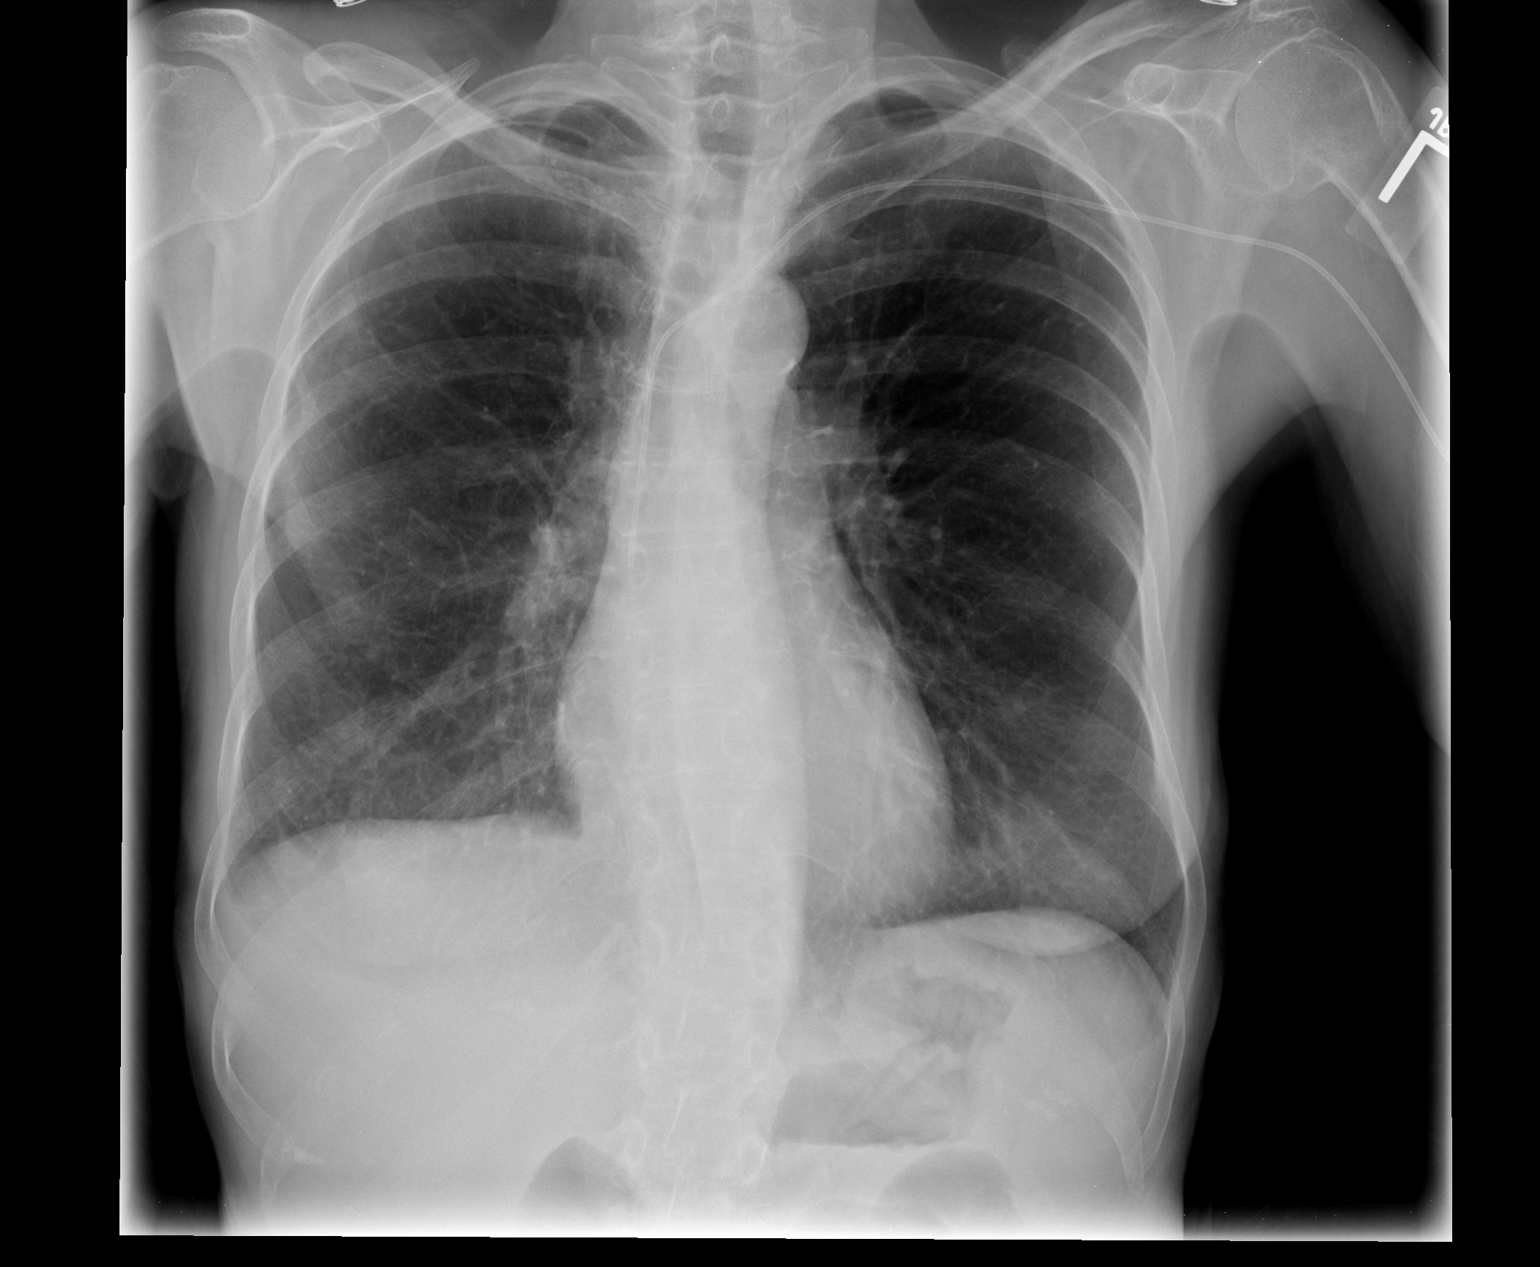

[view not recorded (2 of 2)]
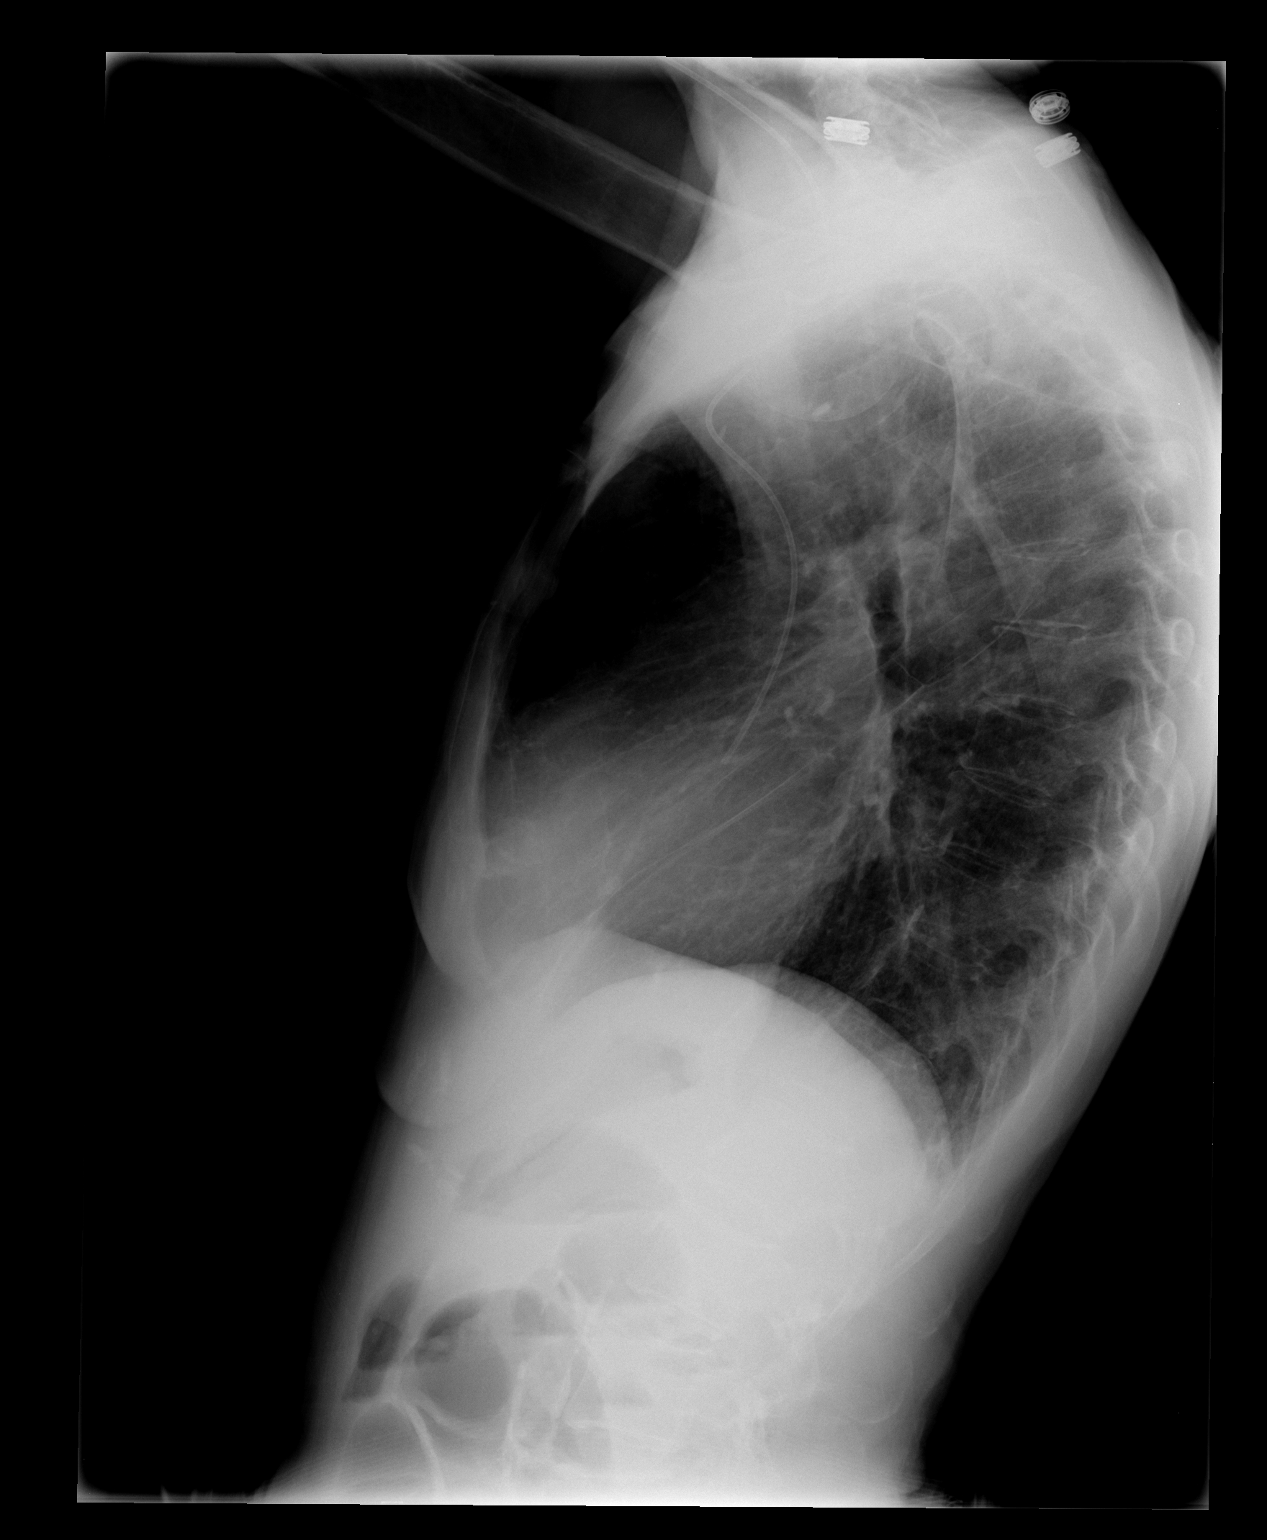

[2 of 2 positions shown; findings below may reference images not displayed]

FINDINGS: Left arm PICC line tip is  in the SVC and unchanged.  The
lungs remain clear and there is no infiltrate or effusion.  There
is no heart failure.  There is COPD.
IMPRESSION: COPD.  No acute cardiopulmonary disease.

## 2010-02-21 ENCOUNTER — Telehealth (INDEPENDENT_AMBULATORY_CARE_PROVIDER_SITE_OTHER): Payer: Self-pay | Admitting: *Deleted

## 2010-02-21 DIAGNOSIS — J9611 Chronic respiratory failure with hypoxia: Secondary | ICD-10-CM | POA: Insufficient documentation

## 2010-02-22 ENCOUNTER — Ambulatory Visit (HOSPITAL_COMMUNITY)
Admission: RE | Admit: 2010-02-22 | Discharge: 2010-02-22 | Payer: Self-pay | Source: Home / Self Care | Admitting: Internal Medicine

## 2010-02-22 ENCOUNTER — Ambulatory Visit: Payer: Self-pay | Admitting: Internal Medicine

## 2010-02-23 ENCOUNTER — Inpatient Hospital Stay (HOSPITAL_COMMUNITY): Admission: EM | Admit: 2010-02-23 | Discharge: 2009-12-27 | Payer: Self-pay | Admitting: Emergency Medicine

## 2010-02-27 IMAGING — CR DG CHEST 1V PORT
1 series · 1 of 1 positions shown · non-contrast
Comparison: Two-view chest x-ray 04/21/2009.

CLINICAL DATA: Bedside PICC placement.  Bowel obstruction.

PORTABLE CHEST - 1 VIEW [DATE]/9800 0081 hours:

[view not recorded]
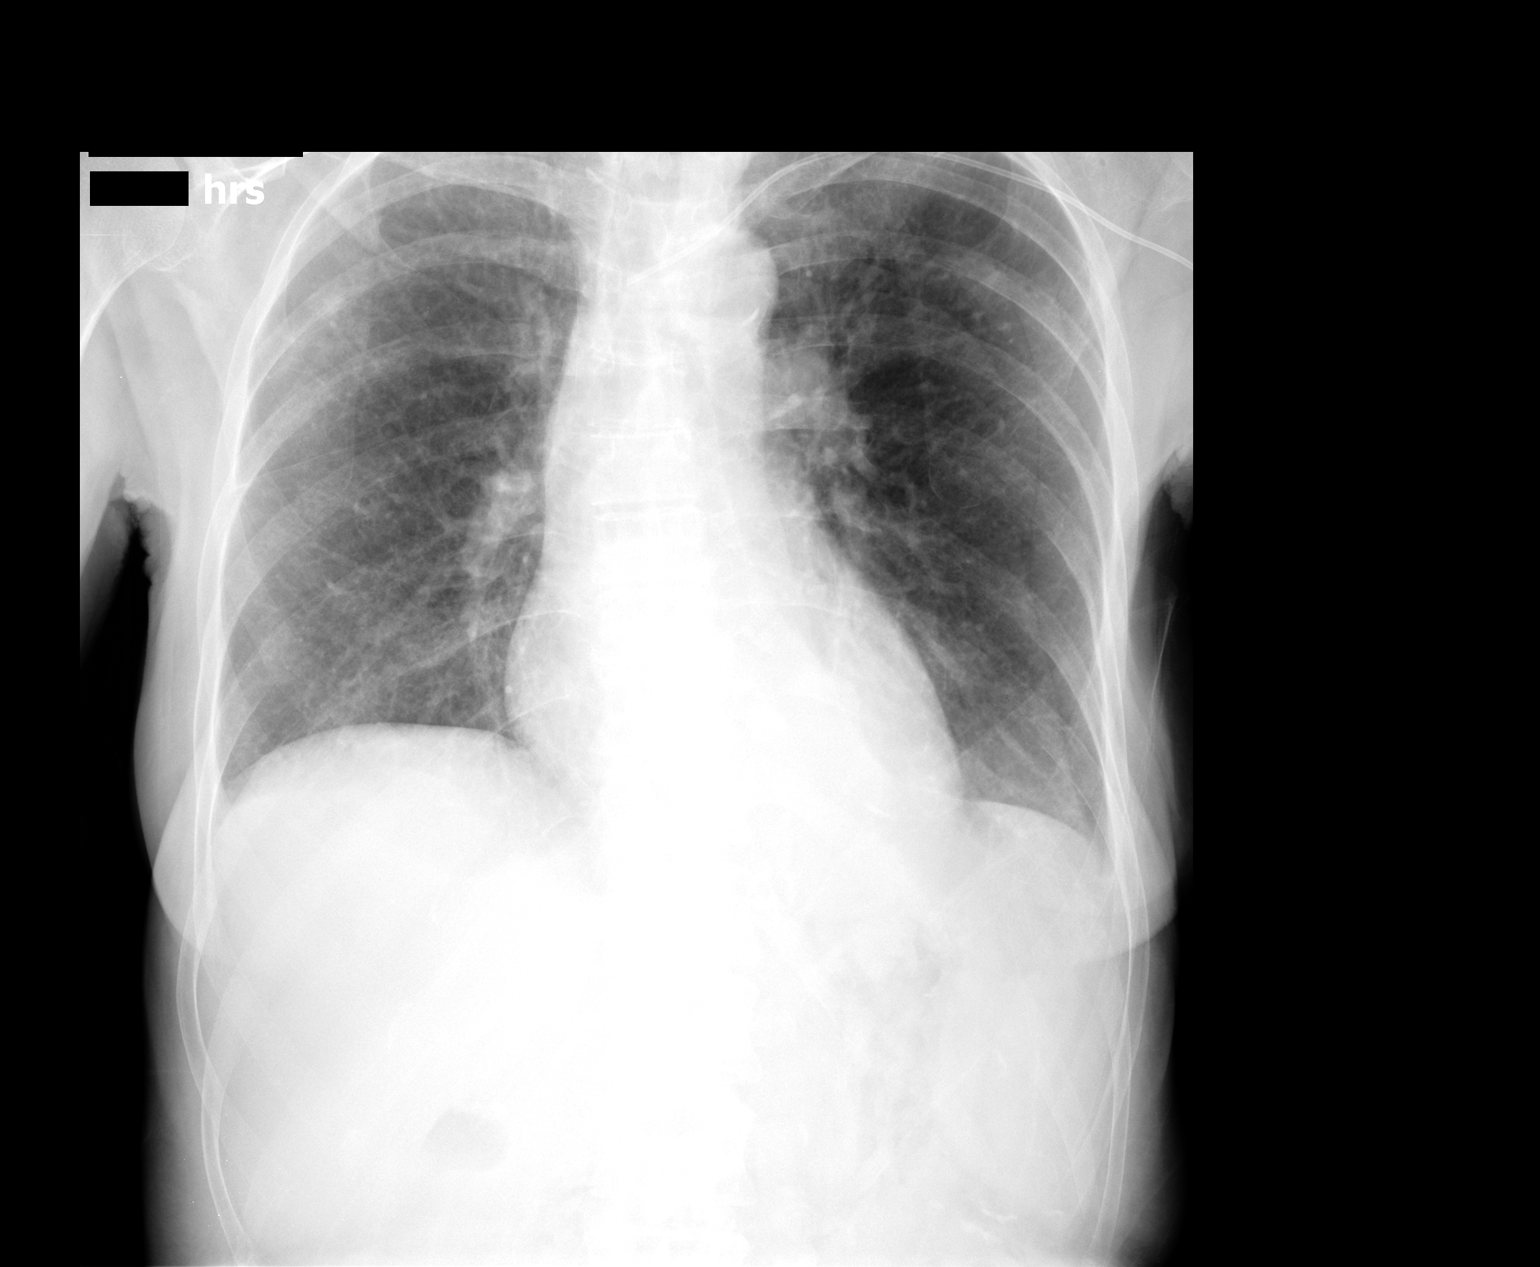

[1 of 1 positions shown; findings below may reference images not displayed]

FINDINGS: Left arm PICC tip in the left innominate vein just before
it enters the SVC.  Heart size normal and stable allowing for
differences in technique.  Thoracic aorta tortuous and
atherosclerotic, unchanged.  Hilar and mediastinal contours
otherwise unremarkable.  Lungs clear.
IMPRESSION: 1.  Left arm PICC tip in the left innominate vein just as it enters
the SVC.  This is a central venous position and is satisfactory for
use.
2.  No acute cardiopulmonary disease.

## 2010-02-28 ENCOUNTER — Telehealth: Payer: Self-pay | Admitting: Family Medicine

## 2010-02-28 IMAGING — US IR US GUIDE VASC ACCESS RIGHT
1 series · 1 of 1 positions shown · non-contrast
Comparison: none

CLINICAL DATA: Malabsorption syndrome, chronic adhesions and
partial small bowel obstruction access for chronic T N A

UPPER EXTREMITY PICC PLACEMENT WITH ULTRASOUND AND FLUORO GUIDANCE
TECHNIQUE: The right arm was prepped with chlorhexidine, draped in
the usual sterile fashion using maximum barrier technique and
infiltrated locally with 1% Lidocaine.  Ultrasound demonstrated
patency of the right brachial vein, and this was documented with an
image.  Under real-time ultrasound guidance, this vein was accessed
with a 21 gauge micropuncture needle and image documentation was
performed.  The needle was exchanged over a guidewire for a peel-
away sheath through which a 5 French double lumen PICC trimmed to
35cm was advanced, positioned with its tip at the lower SVC/right
atrial junction.  Fluoroscopy during the procedure and fluoro spot
radiograph confirms appropriate catheter position.  The catheter
was flushed, secured to the skin with Prolene sutures, and covered
with a sterile dressing.  No immediate complication.
Fluoroscopy Time: 0.2 minutes.

[Series 1: ir us guide vasc access right · 1 of 1 slices shown]
[im 1/1]
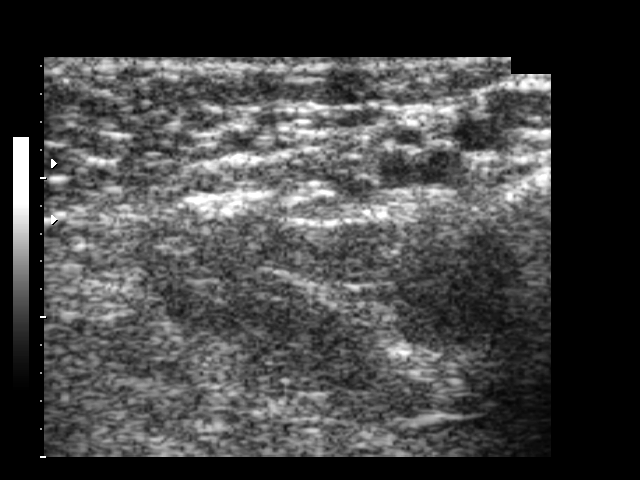

[1 of 1 positions shown; findings below may reference images not displayed]

IMPRESSION: Technically successful right arm PICC placement with ultrasound and
fluoroscopic guidance.  The catheter is ready for use.

## 2010-03-01 ENCOUNTER — Telehealth (INDEPENDENT_AMBULATORY_CARE_PROVIDER_SITE_OTHER): Payer: Self-pay | Admitting: *Deleted

## 2010-03-01 ENCOUNTER — Encounter: Payer: Self-pay | Admitting: Family Medicine

## 2010-03-02 ENCOUNTER — Encounter: Payer: Self-pay | Admitting: Pulmonary Disease

## 2010-03-08 ENCOUNTER — Ambulatory Visit: Payer: Self-pay | Admitting: Cardiology

## 2010-03-21 ENCOUNTER — Telehealth: Payer: Self-pay | Admitting: Family Medicine

## 2010-03-22 ENCOUNTER — Ambulatory Visit
Admission: RE | Admit: 2010-03-22 | Discharge: 2010-03-22 | Payer: Self-pay | Source: Home / Self Care | Attending: Internal Medicine | Admitting: Internal Medicine

## 2010-03-24 ENCOUNTER — Encounter: Payer: Self-pay | Admitting: Cardiology

## 2010-03-24 ENCOUNTER — Ambulatory Visit (HOSPITAL_COMMUNITY)
Admission: RE | Admit: 2010-03-24 | Discharge: 2010-03-24 | Payer: Self-pay | Source: Home / Self Care | Attending: Cardiology | Admitting: Cardiology

## 2010-03-24 ENCOUNTER — Ambulatory Visit: Admission: RE | Admit: 2010-03-24 | Discharge: 2010-03-24 | Payer: Self-pay | Source: Home / Self Care

## 2010-03-27 ENCOUNTER — Ambulatory Visit
Admission: RE | Admit: 2010-03-27 | Discharge: 2010-03-27 | Payer: Self-pay | Source: Home / Self Care | Attending: Internal Medicine | Admitting: Internal Medicine

## 2010-03-27 ENCOUNTER — Encounter: Payer: Self-pay | Admitting: Internal Medicine

## 2010-03-28 ENCOUNTER — Ambulatory Visit: Payer: Self-pay | Admitting: Cardiology

## 2010-03-28 ENCOUNTER — Encounter: Payer: Self-pay | Admitting: Internal Medicine

## 2010-03-31 ENCOUNTER — Encounter: Payer: Self-pay | Admitting: Internal Medicine

## 2010-03-31 LAB — CONVERTED CEMR LAB
Basophils Absolute: 0 10*3/uL (ref 0.0–0.1)
Basophils Relative: 1 % (ref 0–1)
Eosinophils Absolute: 0.2 10*3/uL (ref 0.0–0.7)
Eosinophils Relative: 3 % (ref 0–5)
HCT: 33.4 % — ABNORMAL LOW (ref 36.0–46.0)
Hemoglobin: 10.6 g/dL — ABNORMAL LOW (ref 12.0–15.0)
Lymphocytes Relative: 19 % (ref 12–46)
Lymphs Abs: 1.1 10*3/uL (ref 0.7–4.0)
MCHC: 31.7 g/dL (ref 30.0–36.0)
MCV: 89.1 fL (ref 78.0–100.0)
Monocytes Absolute: 0.6 10*3/uL (ref 0.1–1.0)
Monocytes Relative: 10 % (ref 3–12)
Neutro Abs: 3.9 10*3/uL (ref 1.7–7.7)
Neutrophils Relative %: 68 % (ref 43–77)
Platelets: 259 10*3/uL (ref 150–400)
RBC: 3.75 M/uL — ABNORMAL LOW (ref 3.87–5.11)
RDW: 13.8 % (ref 11.5–15.5)
WBC: 5.7 10*3/uL (ref 4.0–10.5)

## 2010-04-05 ENCOUNTER — Telehealth: Payer: Self-pay | Admitting: Internal Medicine

## 2010-04-07 ENCOUNTER — Telehealth (INDEPENDENT_AMBULATORY_CARE_PROVIDER_SITE_OTHER): Payer: Self-pay | Admitting: *Deleted

## 2010-04-09 ENCOUNTER — Encounter: Payer: Self-pay | Admitting: Interventional Radiology

## 2010-04-10 ENCOUNTER — Ambulatory Visit: Admission: RE | Admit: 2010-04-10 | Discharge: 2010-04-10 | Payer: Self-pay | Source: Home / Self Care

## 2010-04-11 ENCOUNTER — Ambulatory Visit (HOSPITAL_COMMUNITY)
Admission: RE | Admit: 2010-04-11 | Discharge: 2010-04-11 | Payer: Self-pay | Source: Home / Self Care | Attending: Internal Medicine | Admitting: Internal Medicine

## 2010-04-12 ENCOUNTER — Telehealth: Payer: Self-pay | Admitting: Family Medicine

## 2010-04-12 ENCOUNTER — Encounter: Payer: Self-pay | Admitting: Family Medicine

## 2010-04-16 LAB — CONVERTED CEMR LAB
ALT: 13 units/L (ref 0–35)
AST: 18 units/L (ref 0–37)
Albumin: 3.2 g/dL — ABNORMAL LOW (ref 3.5–5.2)
Alkaline Phosphatase: 84 units/L (ref 39–117)
BUN: 16 mg/dL (ref 6–23)
Basophils Absolute: 0 10*3/uL (ref 0.0–0.1)
Basophils Relative: 0.2 % (ref 0.0–3.0)
Bilirubin, Direct: 0.1 mg/dL (ref 0.0–0.3)
CK-MB: 5 ng/mL (ref 0.3–4.0)
CO2: 31 meq/L (ref 19–32)
Calcium: 8.5 mg/dL (ref 8.4–10.5)
Chloride: 104 meq/L (ref 96–112)
Creatinine, Ser: 0.9 mg/dL (ref 0.4–1.2)
Eosinophils Absolute: 0.2 10*3/uL (ref 0.0–0.7)
Eosinophils Relative: 2.6 % (ref 0.0–5.0)
GFR calc Af Amer: 81 mL/min
GFR calc non Af Amer: 67 mL/min
Glucose, Bld: 105 mg/dL — ABNORMAL HIGH (ref 70–99)
HCT: 35.1 % — ABNORMAL LOW (ref 36.0–46.0)
Hemoglobin: 11.9 g/dL — ABNORMAL LOW (ref 12.0–15.0)
Lymphocytes Relative: 9.9 % — ABNORMAL LOW (ref 12.0–46.0)
MCHC: 34 g/dL (ref 30.0–36.0)
MCV: 88.7 fL (ref 78.0–100.0)
Monocytes Absolute: 0.5 10*3/uL (ref 0.1–1.0)
Monocytes Relative: 7.4 % (ref 3.0–12.0)
Neutro Abs: 6 10*3/uL (ref 1.4–7.7)
Neutrophils Relative %: 79.9 % — ABNORMAL HIGH (ref 43.0–77.0)
Platelets: 268 10*3/uL (ref 150–400)
Potassium: 3.6 meq/L (ref 3.5–5.1)
RBC: 3.96 M/uL (ref 3.87–5.11)
RDW: 12.5 % (ref 11.5–14.6)
Sodium: 141 meq/L (ref 135–145)
TSH: 1.16 microintl units/mL (ref 0.35–5.50)
Total Bilirubin: 0.5 mg/dL (ref 0.3–1.2)
Total CK: 59 units/L (ref 7–177)
Total Protein: 5.9 g/dL — ABNORMAL LOW (ref 6.0–8.3)
WBC: 7.4 10*3/uL (ref 4.5–10.5)

## 2010-04-17 ENCOUNTER — Telehealth (INDEPENDENT_AMBULATORY_CARE_PROVIDER_SITE_OTHER): Payer: Self-pay | Admitting: *Deleted

## 2010-04-18 NOTE — Letter (Signed)
Summary: Disability Form/[employer]  Disability Form/[employer]   Imported By: Lanelle Bal 10/15/2007 13:26:03  _____________________________________________________________________  External Attachment:    Type:   Image     Comment:   External Document

## 2010-04-18 NOTE — Letter (Signed)
Summary: [facility]  [facility]   Imported By: Charlette Caffey 08/06/2008 14:57:27  _____________________________________________________________________  External Attachment:    Type:   Image     Comment:   External Document

## 2010-04-18 NOTE — Progress Notes (Signed)
Summary: med refill   Phone Note Call from Patient   Caller: Patient Reason for Call: Refill Medication Summary of Call: dr. Blossom Hoops [phone] needs to pick up rx for fentanyl per hour 2 boxs 5 per box, endocet 10/325mg  tabs #120  Initial call taken by: Charolette Child,  October 31, 2006 2:50 PM  Follow-up for Phone Call        [name] aware rx ready for p/u Follow-up by: Doristine Devoid,  November 01, 2006 1:51 PM    New/Updated Medications: FENTANYL 50 MCG/HR  PT72 (FENTANYL) change patch every three days   Prescriptions: FENTANYL 50 MCG/HR  PT72 (FENTANYL)   #10 x 0   Entered by:   Doristine Devoid   Authorized by:   Leanne Chang MD   Signed by:   Doristine Devoid on 11/01/2006   Method used:   Print then Give to Patient   RxID:   [id number] ENDOCET 10-325 MG  TABS (OXYCODONE-ACETAMINOPHEN) Take one tablet every 6-8 hours as needed for pain.  #120 x 0   Entered by:   Doristine Devoid   Authorized by:   Leanne Chang MD   Signed by:   Doristine Devoid on 11/01/2006   Method used:   Print then Give to Patient   RxID:   [id number]

## 2010-04-18 NOTE — Assessment & Plan Note (Signed)
Summary: annual f/u...as.    History of Present Illness Visit Type: Follow-up Visit Primary GI MD: [name] MD Primary Provider: Loreen Freud, DO Chief Complaint: Yearly follow up, has concerns that stomach is not emptying fast enough, had surgery on intestines Feb 9th by Dr Dwain Sarna History of Present Illness:   This is a 65 year old white female with chronic low-grade small bowel obstructions and gastroparesis. She isstatus post exploratory laparotomy on 04/27/09 and lysis of adhesions with placement of a 28 French gastrostomy which has recently been changed to a 24 [place]. She is using her gastrostomy tube to flush retained gastric content and she does that several times a day. Her weight was 97 pounds prior to the surgery and it dropped to 85 pounds after the surgery. She is 89 pounds today. She is eating 3 times a day, small amounts of food. She has not had any vomiting episodes mostly due to venting the gastrostomy tube several times a day. 2-6 ounces of fluid is drained out of the gastrostomy at a time. She has drainage around the gastrostomy tube as well, mostly serous fluid. She still has a PICC line in her right arm which she uses occasionally for IV fluids.   GI Review of Systems    Reports loss of appetite, nausea, and  weight loss.      Denies abdominal pain, acid reflux, belching, bloating, chest pain, dysphagia with liquids, dysphagia with solids, heartburn, vomiting, vomiting blood, and  weight gain.      Reports change in bowel habits and  diarrhea.     Denies anal fissure, black tarry stools, constipation, diverticulosis, fecal incontinence, heme positive stool, hemorrhoids, irritable bowel syndrome, jaundice, light color stool, liver problems, rectal bleeding, and  rectal pain.    Current Medications (verified): 1)  Spiriva Handihaler 18 Mcg Caps (Tiotropium Bromide Monohydrate) .... One Puff Once Daily 2)  Ventolin Hfa 108 (90 Base) Mcg/act Aers (Albuterol Sulfate) ....  Two Puffs Up To Four Times Per Day As Needed 3)  Endocet 10-325 Mg  Tabs (Oxycodone-Acetaminophen) .... Take One Tablet Every 6-8 Hours As Needed For Pain. 4)  Fentanyl 50 Mcg/hr  Pt72 (Fentanyl) .... Change Patch Every Three Days 5)  Mobic 15 Mg Tabs (Meloxicam) .... Take 1/2 - 1 By Mouth Once Daily 6)  Prilosec 40 Mg Cpdr (Omeprazole) .... Take 1 Tablet By Mouth Once A Day 7)  Norvasc 5 Mg Tabs (Amlodipine Besylate) .[name].. 1 By Mouth Once Daily 8)  Flecainide Acetate 50 Mg  Tabs (Flecainide Acetate) .[name].. 1 Tablet By Mouth Every Morning and 1/2 Tablet Po Every Night 9)  Temazepam 15 Mg  Caps (Temazepam) .... Take One To Two Capsules At Bedtime 10)  Cyclobenzaprine Hcl 10 Mg  Tabs (Cyclobenzaprine Hcl) .... Take One Tablet Three Times Daily 11)  Gabapentin 600 Mg  Tabs (Gabapentin) .... Take One Tablet 4 Times Daily 12)  Promethazine-Codeine 6.25-10 Mg/77ml Syrp (Promethazine-Codeine) .... 5 Ml By Mouth Every 4-6 Hours As Needed,  13)  Zofran 8 Mg Tabs (Ondansetron Hcl) .[name].. 1 By Mouth Q12 H As Needed 14)  Foradil Aerolizer 12 Mcg Caps (Formoterol Fumarate) .... One Puff Two Times A Day 15)  Zithromax Z-Pak 250 Mg Tabs (Azithromycin) .... As Directed 16)  Levaquin 500 Mg Tabs (Levofloxacin) .[name].. 1 By Mouth Once Daily For 10 Days.  Allergies (verified): 1)  ! Talwin 2)  ! Sulfa  Past History:  Past Medical History: Reviewed history from 02/17/2009 and no changes required. Anemia-iron deficiency  CHRONIC PARTIAL SMALL BOWEL OBSTRUCTION Hypertension Osteoarthritis GERD CAD Left bundle branch block Atrial fibrillation - Dr. Roger Shelter Chronic pain Fibromyalgia Diverticulosis, colon COPD/Emphysema      -06/28/08 PFT FEV1 1.95 (97%), FVC 2.79 (101%), FEV1% 70, TLC 3.88 (87%), DLCO 64% Pneumonia      - 03/09, 12/09, 02/10, 06/10 Exudative pleural effusion s/p right thoracentesis 03/09  Past Surgical History: Intestinal resection (5times),S/P PARTIAL COLECTOMY  2001 Hysterectomy-complete Appendectomy Bladder lift Tonsillectomy Lysis of intestinal adhesions-numerous surgeries/LAST 2003 PICC Line TAH Intestinal surgery again 04/27/2009  Family History: Reviewed history from 05/12/2008 and no changes required. Family History of Heart Disease: Father Family History of CVA: Mother No FH of Colon Cancer:  Social History: Reviewed history from 04/07/2009 and no changes required. Occupation: Disabled Alcohol Use - no Illicit Drug Use - no Patient is a former smoker, quit 1988 x56yrs, <1ppd.  Daily Caffeine Use  Review of Systems       The patient complains of fatigue.  The patient denies allergy/sinus, anemia, anxiety-new, arthritis/joint pain, back pain, blood in urine, breast changes/lumps, change in vision, confusion, cough, coughing up blood, depression-new, fainting, fever, headaches-new, hearing problems, heart murmur, heart rhythm changes, itching, menstrual pain, muscle pains/cramps, night sweats, nosebleeds, pregnancy symptoms, shortness of breath, skin rash, sleeping problems, sore throat, swelling of feet/legs, swollen lymph glands, thirst - excessive , urination - excessive , urination changes/pain, urine leakage, vision changes, and voice change.         Pertinent positive and negative review of systems were noted in the above HPI. All other ROS was otherwise negative.   Vital Signs:  Patient profile:   65 year old female Height:      62 inches Weight:      89 pounds BMI:     16.34 BSA:     1.36 Pulse rate:   88 / minute Pulse rhythm:   regular BP sitting:   102 / 64  (left arm)  Vitals Entered By: Merri Ray CMA Duncan Dull) (Jul 21, 2009 10:16 AM)  Physical Exam  General:  very slim, alert, oriented and pleasant. Eyes:  PERRLA, no icterus. Mouth:  No deformity or lesions, dentition normal. Neck:  Supple; no masses or thyromegaly. Lungs:  Clear throughout to auscultation. Heart:  rapid S1-S2, no murmur. Abdomen:   scaphoid abdomen with gastrostomy through epigastrium with surrounding erythema and serous drainage from around the adapter. There is mild tenderness diffusely but no rebound. Bowel sounds are normoactive with no abnormal rushes. Extremities:  No clubbing, cyanosis, edema or deformities noted. Skin:  Intact without significant lesions or rashes. Psych:  Alert and cooperative. Normal mood and affect.   Impression & Recommendations:  Problem # 1:  GASTROPARESIS (ICD-536.3) Patient has ongoing GI dysmotility secondary to long-term low grade small bowel obstructions which has been relieved. Patient continues to have a high volume of retained fluid in her gastrostomy. She needs to continue to use her gastrostomy to prevent vomiting. We will start her on Reglan 5 mg 3 times a day before meals and also will give her additional Resource 8 ounces at bedtime for nutritional support. We will check her nutritional parameters today.  Problem # 2:  NAUSEA AND VOMITING (ICD-787.01) Patient has not had any nausea or vomiting recently as long as she can drain her gastrostomy. Orders: TLB-CBC Platelet - w/Differential (85025-CBCD) TLB-CMP (Comprehensive Metabolic Pnl) (80053-COMP) TLB-B12, Serum-Total ONLY (16109-U04) TLB-Magnesium (Mg) (83735-MG) TLB-Albumin (82040-ALB)  Problem # 3:  DIVERTICULOSIS, COLON (ICD-562.10) Patient's last colonoscopy  in August 2000 was normal. When she recovers from her surgery, she may need a followup colonoscopy.  Patient Instructions: 1)  low residue diet with 6 small feedings a day. 2)  Reglan 5 mg p.o. t.i.d. a.c. 3)  Resource one can at bedtime. 4)  Keep the PICC line for now. 5)  Continue skin care around the gastrostomy. 6)  Office visit 8 week.s 7)  CBC, potassium and electrolytes and magnesium as well as serum albumin. 8)  Copy sent to : Dr Dwain Sarna, Dr Laury Axon 9)  The medication list was reviewed and reconciled.  All changed / newly prescribed medications were  explained.  A complete medication list was provided to the patient / caregiver. Prescriptions: RESOURCE BREEZE  LIQD (NUTRITIONAL SUPPLEMENTS) Take 1 can (250 calories/can) two times a day per PEG tube. Dx: 536.3-Gastroparesis, 787.01-nausea and vomiting  #54 cans x 3   Entered by:   Lamona Curl CMA (AAMA)   Authorized by:   Hart Carwin MD   Signed by:   Lamona Curl CMA (AAMA) on 07/21/2009   Method used:   Printed then faxed to ...       [facility] (retail)       [address]       [place], [place]  [id number]       Ph: [phone]       Fax: [phone]   RxID:   [id number] RESOURCE BREEZE  LIQD (NUTRITIONAL SUPPLEMENTS) Take 1 can two times a day per PEG tube. Dx: 536.3-Gastroparesis, 787.01-nausea and vomiting  #54 cans x 3   Entered by:   Lamona Curl CMA (AAMA)   Authorized by:   Hart Carwin MD   Signed by:   Lamona Curl CMA (AAMA) on 07/21/2009   Method used:   Printed then faxed to ...       [facility] (retail)       [address]       [place], [place]  [id number]       Ph: [phone]       Fax: [phone]   RxID:   [id number] REGLAN 5 MG TABS (METOCLOPRAMIDE HCL) Take 1 tablet by mouth three times a day before meals  #90 x 3   Entered by:   Lamona Curl CMA (AAMA)   Authorized by:   Hart Carwin MD   Signed by:   Lamona Curl CMA (AAMA) on 07/21/2009   Method used:   Electronically to        [facility] (retail)       [address]       [place], [place]  [address]       Ph: [phone]       Fax: [phone]   RxID:   [id number] RESOURCE NUTRITIONAL SUPPLEMENT 1 can by mouth two times a day per PEG tube  #60 cans x 3   Entered by:   Lamona Curl CMA (AAMA)   Authorized by:   Hart Carwin MD   Signed by:   Lamona Curl CMA (AAMA) on 07/21/2009   Method used:   Faxed to ...       [facility] (retail)       [address]       [place], [place]  [address]       Ph: [phone]       Fax: [phone]   RxID:   [id number]  of  note: resource nutritional supplement prescription has been printed again and faxed to pharmacy for medicare purposes. Dottie Nelson-Smith CMA Duncan Dull)  Jul 21, 2009 12:41 PM

## 2010-04-18 NOTE — Letter (Signed)
[facility]   Imported By: Lanelle Bal 03/25/2008 10:27:02  _____________________________________________________________________  External Attachment:    Type:   Image     Comment:   External Document  Appended Document: [facility] see telephone message,  please send it to Dr Myna Hidalgo

## 2010-04-18 NOTE — Progress Notes (Signed)
Summary: refill RX PRINTED AND TO YOU  Phone Note Call from Patient   Caller: Patient Reason for Call: Refill Medication Summary of Call: dr. Blossom Hoops [phone] needs a refill for oxycodine 10-325mg  and fentanyl 50mg  hr every 6-8 hours as needed. her last refill  was 9.19.08. she has a 64month f/u 12.15.08. Initial call taken by: Charolette Child,  January 08, 2007 1:15 PM  Follow-up for Phone Call        RX PRINTED AND TO YOU FOR SIG Follow-up by: Kandice Hams,  January 08, 2007 2:51 PM  Additional Follow-up for Phone Call Additional follow up Details #1::        Pt. aware ...................................................................Ardyth Man  January 08, 2007 4:15 PM (husband) Additional Follow-up by: Ardyth Man,  January 08, 2007 4:15 PM      Prescriptions: ENDOCET 10-325 MG  TABS (OXYCODONE-ACETAMINOPHEN) Take one tablet every 6-8 hours as needed for pain.  #120 x 0   Entered by:   Kandice Hams   Authorized by:   Leanne Chang MD   Signed by:   Kandice Hams on 01/08/2007   Method used:   Print then Give to Patient   RxID:   [id number] FENTANYL 50 MCG/HR  PT72 (FENTANYL) change patch every three days  #10 x 0   Entered by:   Kandice Hams   Authorized by:   Leanne Chang MD   Signed by:   Kandice Hams on 01/08/2007   Method used:   Print then Give to Patient   RxID:   [id number]

## 2010-04-18 NOTE — Assessment & Plan Note (Signed)
Summary: MONTHLY B12 SHOT  Nurse Visit   Allergies: 1)  ! Talwin 2)  ! Sulfa  Medication Administration  Injection # 1:    Medication: Vit B12 1000 mcg    Diagnosis: B12 DEFICIENCY (ICD-266.2)    Route: IM    Site: L deltoid    Exp Date: 03/2011    Lot #: [id number]    Mfr: [employer] Regent    Comments: pt to schedule next monthly b12 at front desk    Patient tolerated injection without complications    Given by: Chales Abrahams CMA Duncan Dull) (December 19, 2009 10:56 AM)  Orders Added: 1)  Vit B12 1000 mcg [J3420]

## 2010-04-18 NOTE — Progress Notes (Signed)
Summary: refill-dr lowne  Phone Note Call from Patient Call back at Home Phone [phone]   Caller: Patient Summary of Call: refill endocet 10mg /325 ----fentanyl patch 50 mcg  Initial call taken by: Okey Regal [place],  October 06, 2008 8:41 AM  Follow-up for Phone Call        09-02-08 last OV, last filled  patch #10, endocet #120 .....Marland KitchenMarland KitchenFelecia Deloach CMA  October 06, 2008 11:17 AM  Additional Follow-up for Phone Call Additional follow up Details #1::        ok to refill x1 Additional Follow-up by: Loreen Freud DO,  October 06, 2008 3:43 PM    Additional Follow-up for Phone Call Additional follow up Details #2::    pt aware rx ready for pickup .Kandice Hams  October 06, 2008 4:33 PM\par  Prescriptions: FENTANYL 50 MCG/HR  PT72 (FENTANYL) change patch every three days  #10 x 0   Entered by:   Kandice Hams   Authorized by:   Loreen Freud DO   Signed by:   Kandice Hams on 10/06/2008   Method used:   Print then Give to Patient   RxID:   [id number] ENDOCET 10-325 MG  TABS (OXYCODONE-ACETAMINOPHEN) Take one tablet every 6-8 hours as needed for pain.  #120 x 0   Entered by:   Kandice Hams   Authorized by:   Loreen Freud DO   Signed by:   Kandice Hams on 10/06/2008   Method used:   Print then Give to Patient   RxID:   [id number]

## 2010-04-18 NOTE — Progress Notes (Signed)
Summary: lowne--refill endocet,fentanyl  Phone Note Call from Patient Call back at Work Phone [phone]   Caller: Patient Reason for Call: Refill Medication Summary of Call: Pt is calling for aprescription for endocet 10.325 mg and fentanyl 50 mg. Please call when ready. Initial call taken by: Freddy Jaksch,  November 27, 2007 8:55 AM  Follow-up for Phone Call        last refill endocet 10/24/07 #120 no rf fentanyl #10 no refill 09/26/07  last ov 09/08/07 .Kandice Hams  November 27, 2007 12:04 PM  Follow-up by: Kandice Hams,  November 27, 2007 12:04 PM  Additional Follow-up for Phone Call Additional follow up Details #1::        ok x1 each----  Additional Follow-up by: Loreen Freud DO,  November 27, 2007 12:19 PM    Additional Follow-up for Phone Call Additional follow up Details #2::    Spoke with pt informed rx ready for pickup .Kandice Hams  November 27, 2007 12:40 PM  Follow-up by: Kandice Hams,  November 27, 2007 12:40 PM    Prescriptions: FENTANYL 50 MCG/HR  PT72 (FENTANYL) change patch every three days  #10 x 0   Entered and Authorized by:   Loreen Freud DO   Signed by:   Loreen Freud DO on 11/27/2007   Method used:   Print then Give to Patient   RxID:   [id number] ENDOCET 10-325 MG  TABS (OXYCODONE-ACETAMINOPHEN) Take one tablet every 6-8 hours as needed for pain.  #120 x 0   Entered and Authorized by:   Loreen Freud DO   Signed by:   Loreen Freud DO on 11/27/2007   Method used:   Print then Give to Patient   RxID:   [id number]

## 2010-04-18 NOTE — Letter (Signed)
Summary: CMN for Infusion/[facility]  CMN for Infusion/[facility]   Imported By: Lanelle Bal 07/05/2009 08:03:27  _____________________________________________________________________  External Attachment:    Type:   Image     Comment:   External Document

## 2010-04-18 NOTE — Assessment & Plan Note (Signed)
Summary: F/U APPT...LSW.    History of Present Illness Visit Type: Follow-up Visit Primary GI MD: [name] MD Primary Provider: Loreen Freud, DO Chief Complaint: Pt. has no GI complaints at this time  Pt. wants to know if she should take Prilsec or Protonix.   History of Present Illness:   This is a 65 year old white female with a chronic low-grade small bowel obstruction who is status post exploratory laparotomy in February 2011 with lysis of adhesions. She has a gastrostomy for decompression and a subtotal colectomy from prior surgeries. She has been maintained via PICC line and IV fluids. She received an iron infusion in July and her hemoglobin has so far increased only from 9.7-10.1. She has gained 3 pounds since her last visit. She has been followed by Dr. Dwain Sarna who is trying to reduce her G tube drainage  to every other day or possibly every several days before he can remove the G-tube. She had a recent hospitalization for heart failure. She was diagnosed with cardiomyopathy by Dr Deborah Chalk and is scheduled to have a pacemaker placed by Dr. Graciela Husbands. She is also scheduled for  Port-A-Cath.   GI Review of Systems    Reports nausea.      Denies abdominal pain, acid reflux, belching, bloating, chest pain, dysphagia with liquids, dysphagia with solids, heartburn, loss of appetite, vomiting, vomiting blood, weight loss, and  weight gain.        Denies anal fissure, black tarry stools, change in bowel habit, constipation, diarrhea, diverticulosis, fecal incontinence, heme positive stool, hemorrhoids, irritable bowel syndrome, jaundice, light color stool, liver problems, rectal bleeding, and  rectal pain.    Allergies: 1)  ! Talwin 2)  ! Sulfa  Past History:  Past Medical History: Reviewed history from 11/15/2009 and no changes required. Anemia-iron deficiency CHRONIC PARTIAL SMALL BOWEL OBSTRUCTION Hypertension Osteoarthritis GERD CAD Left bundle branch block Atrial fibrillation  - Dr. Roger Shelter Chronic pain Fibromyalgia Diverticulosis, colon COPD/Emphysema      -06/28/08 PFT FEV1 1.95 (97%), FVC 2.79 (101%), FEV1% 70, TLC 3.88 (87%), DLCO 64% Pneumonia      - 03/09, 12/09, 02/10, 06/10 Exudative pleural effusion s/p right thoracentesis 03/09 RLL pneumonia 10/26/2009,  sepsis picc line, UTI,   [place]  Past Surgical History: Reviewed history from 09/07/2009 and no changes required. Intestinal resection (5times),S/P PARTIAL COLECTOMY 2001 Hysterectomy-complete Appendectomy Bladder lift Tonsillectomy Lysis of intestinal adhesions-numerous surgeries/LAST 2003 PICC Line TAH Intestinal surgery again 04/27/2009 06/2009--changed colostomy tube---Dr Dwain Sarna  Family History: Reviewed history from 05/12/2008 and no changes required. Family History of Heart Disease: Father Family History of CVA: Mother No FH of Colon Cancer:  Social History: Reviewed history from 04/07/2009 and no changes required. Occupation: Disabled Alcohol Use - no Illicit Drug Use - no Patient is a former smoker, quit 1988 x29yrs, <1ppd.  Daily Caffeine Use  Review of Systems  The patient denies allergy/sinus, anemia, anxiety-new, arthritis/joint pain, back pain, blood in urine, breast changes/lumps, change in vision, confusion, cough, coughing up blood, depression-new, fainting, fatigue, fever, headaches-new, hearing problems, heart murmur, heart rhythm changes, itching, menstrual pain, muscle pains/cramps, night sweats, nosebleeds, pregnancy symptoms, shortness of breath, skin rash, sleeping problems, sore throat, swelling of feet/legs, swollen lymph glands, thirst - excessive , urination - excessive , urination changes/pain, urine leakage, vision changes, and voice change.         Pertinent positive and negative review of systems were noted in the above HPI. All other ROS was otherwise negative.   Vital  Signs:  Patient profile:   65 year old female Height:      62  inches Weight:      94.50 pounds BMI:     17.35 Pulse rate:   84 / minute BP sitting:   104 / 62  (right arm)  Vitals Entered By: Milford Cage NCMA (December 01, 2009 9:57 AM)  Physical Exam  General:  chronically ill appearing, thin, alert and oriented and very pleasant. Eyes:  PERRLA, no icterus. Mouth:  No deformity or lesions, dentition normal. Neck:  Supple; no masses or thyromegaly. Lungs:  Clear throughout to auscultation. Heart:  Regular rate and rhythm; no murmurs, rubs,  or bruits. Abdomen:  soft scaphoid abdomen with normal active bowel sounds. Gastrostomy in the left upper quadrant with minimal drainage from around the gastrostomy site. Mild diffuse tenderness but no distention. Rectal:  soft Hemoccult positive stool. Extremities:  No clubbing, cyanosis, edema or deformities noted. Skin:  Intact without significant lesions or rashes. Psych:  Alert and cooperative. Normal mood and affect.   Impression & Recommendations:  Problem # 1:  BOWEL OBSTRUCTION (ICD-560.9) Patient has chronic low grade partial small bowel obstructions. She currently has a decompressive G-tube which she is trying to use only once a day. She feels nauseated if she doesn't drain the gastric content. In order to remove the G-tube, she will have to be able to avoid use of the G-tube for 2 weeks at a time as per Dr. Dwain Sarna suggestions. She is currently doing quite well, gaining 3 pounds since her last visit with me. She will continue eating a low-residue diet and take pantoprazole 40 mg daily.  Problem # 2:  ANEMIA-IRON DEFICIENCY (ICD-280.9) Patient is status post iron infusion in July 2011. She is Hemoccult-positive today. We will continue her on iron supplements.  Patient Instructions: 1)  Followup with Dr. Dwain Sarna for G-tube removal. 2)  Follow up with Dr.Tennant and Dr Graciela Husbands for pacemaker insertion. 3)  I will see her in approximately 3 months. 4)  Continue low residue diet and PPI. 5)   The medication list was reviewed and reconciled.  All changed / newly prescribed medications were explained.  A complete medication list was provided to the patient / caregiver.

## 2010-04-18 NOTE — Progress Notes (Signed)
Summary: [facility]---RX  Phone Note Refill Request   Refills Requested: Medication #1:  TEMAZEPAM 15 MG  CAPS Take one to two capsules at bedtime [facility]--[phone]  Initial call taken by: Freddy Jaksch,  June 22, 2008 10:16 AM  Follow-up for Phone Call        last filled 05-19-08 #60, last ov 06-10-08.............Marland KitchenFelecia Deloach CMA  June 22, 2008 10:24 AM  Additional Follow-up for Phone Call Additional follow up Details #1::        ok to fill x1 Additional Follow-up by: Loreen Freud DO,  June 22, 2008 10:51 AM      Prescriptions: TEMAZEPAM 15 MG  CAPS (TEMAZEPAM) Take one to two capsules at bedtime  #60 x 0   Entered by:   Jeremy Johann CMA   Authorized by:   Loreen Freud DO   Signed by:   Jeremy Johann CMA on 06/22/2008   Method used:   Printed then faxed to ...       [facility] (retail)       [address]       [place], [place]  [id number]       Ph: [phone]       Fax: [phone]   RxID:   [id number]

## 2010-04-18 NOTE — Assessment & Plan Note (Signed)
Summary: HFU/LC   Visit Type:  Hospital Follow-up Copy to:  Roger Shelter Primary Provider/Referring Provider:  Loreen Freud, DO  CC:  HFU...the patient says she is feeling good...no complaints today.  History of Present Illness: 65  yo female with history of COPD/emphysema and recurrent pneumonia.  She was hospitalized recently for bleeding ulcer.    Her breathing has been doing well otherwise.  She has occasional cough.  She denies chest pain, sputum, hemoptysis, fever, or wheeze.  She has not been using her foradil or ventolin.  She continues to use spiriva once daily.   Current Medications (verified): 1)  Foradil Aerolizer 12 Mcg Caps (Formoterol Fumarate) .... One Puff Two Times A Day 2)  Spiriva Handihaler 18 Mcg Caps (Tiotropium Bromide Monohydrate) .... One Puff Once Daily 3)  Ventolin Hfa 108 (90 Base) Mcg/act Aers (Albuterol Sulfate) .... Two Puffs Up To Four Times Per Day As Needed 4)  Endocet 10-325 Mg  Tabs (Oxycodone-Acetaminophen) .... Take One Tablet Every 6-8 Hours As Needed For Pain. 5)  Fentanyl 50 Mcg/hr  Pt72 (Fentanyl) .... Change Patch Every Three Days 6)  Pantoprazole Sodium 40 Mg Tbec (Pantoprazole Sodium) .... Take 1 Tablet By Mouth Once A Day 7)  Temazepam 15 Mg  Caps (Temazepam) .... Take One To Two Capsules At Bedtime 8)  Cyclobenzaprine Hcl 10 Mg  Tabs (Cyclobenzaprine Hcl) .... Take One Tablet Three Times Daily 9)  Gabapentin 600 Mg  Tabs (Gabapentin) .... Take One Tablet 4 Times Daily 10)  Zofran 8 Mg Tabs (Ondansetron Hcl) .[name].. 1 By Mouth Q12 H As Needed 11)  Resource Breeze  Liqd (Nutritional Supplements) .... Take 1 Can (250 Calories/can) Two Times A Day Per Peg Tube. Dx: 536.3-Gastroparesis, 787.01-Nausea and Vomiting 12)  Reglan 5 Mg Tabs (Metoclopramide Hcl) .... Take 1 Tablet By Mouth Three Times A Day Before Meals 13)  Furosemide 20 Mg Tabs (Furosemide) .[name].. 1 By Mouth Qd 14)  Nitrostat 0.4 Mg Subl (Nitroglycerin) .... By Mouth 15)  Amiodarone  Hcl 200 Mg Tabs (Amiodarone Hcl) .[name].. 1 By Mouth Daily 16)  Carvedilol 6.25 Mg Tabs (Carvedilol) .... 2 By Mouth Daily 17)  Diflucan 100 Mg Tabs (Fluconazole) .... Take 1 Tablet By Mouth Once A Day X 3 Days  Allergies (verified): 1)  ! Talwin 2)  ! Sulfa 3)  ! * Pradaxa  Past History:  Past Medical History: Anemia-iron deficiency CHRONIC PARTIAL SMALL BOWEL OBSTRUCTION Hypertension Osteoarthritis GERD CAD Left bundle branch block Atrial fibrillation - Dr. 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 Chronic pain Fibromyalgia Diverticulosis, colon COPD/Emphysema      -06/28/08 PFT FEV1 1.95 (97%), FVC 2.79 (101%), FEV1% 70, TLC 3.88 (87%), DLCO 64% Pneumonia      - 03/09, 12/09, 02/10, 06/10 Exudative pleural effusion s/p right thoracentesis 03/09 RLL pneumonia 10/26/2009,  sepsis picc line, UTI,   [place]  Vital Signs:  Patient profile:   65 year old female Height:      62 inches (157.48 cm) Weight:      92.50 pounds (42.05 kg) BMI:     16.98 O2 Sat:      93 % on Room air Temp:     97.7 degrees F (36.50 degrees C) oral Pulse rate:   83 / minute BP sitting:   102 / 58  (right arm) Cuff size:   regular  Vitals Entered By: Michel Bickers CMA (February 02, 2010 1:30 PM)  O2 Sat at Rest %:  93  O2 Flow:  Room air CC: HFU...the patient says she is feeling good...no complaints today Comments Medications reviewed with patient Michel Bickers [facility]  February 02, 2010 1:31 PM   Physical Exam  General:  thin.   Nose:  no deformity, discharge, inflammation, or lesions Mouth:  no deformity or lesions Neck:  no JVD.   Lungs:  decreased breath sounds, no wheezing or rales, no dullness Heart:  regular rhythm, normal rate, and no murmurs.   Extremities:  no edema Neurologic:  normal CN II-XII and strength normal.   Cervical Nodes:  no significant adenopathy Psych:  alert and cooperative; normal mood and affect; normal attention span  and concentration   Impression & Recommendations:  Problem # 1:  COPD (ICD-496) She has been doing well with her breathing.  She is to continue on spiriva and as needed ventolin.  Problem # 2:  SNORING (ICD-786.09) She was noted to have apneic events while asleep, and snores some.  She was told her oxygen was low while asleep at night while in the hospital.  She is not convinced she has a sleep problem.  Will arrange for overnight oximetry on room air to further assess whether she needs regular use of nocturnal oxygen or further assessment for sleep apnea.  Complete Medication List: 1)  Spiriva Handihaler 18 Mcg Caps (Tiotropium bromide monohydrate) .... One puff once daily 2)  Ventolin Hfa 108 (90 Base) Mcg/act Aers (Albuterol sulfate) .... Two puffs up to four times per day as needed 3)  Endocet 10-325 Mg Tabs (Oxycodone-acetaminophen) .... Take one tablet every 6-8 hours as needed for pain. 4)  Fentanyl 50 Mcg/hr Pt72 (Fentanyl) .... Change patch every three days 5)  Pantoprazole Sodium 40 Mg Tbec (Pantoprazole sodium) .... Take 1 tablet by mouth once a day 6)  Temazepam 15 Mg Caps (Temazepam) .... Take one to two capsules at bedtime 7)  Cyclobenzaprine Hcl 10 Mg Tabs (Cyclobenzaprine hcl) .... Take one tablet three times daily 8)  Gabapentin 600 Mg Tabs (Gabapentin) .... Take one tablet 4 times daily 9)  Zofran 8 Mg Tabs (Ondansetron hcl) .[name].. 1 by mouth q12 h as needed 10)  Resource Breeze Liqd (Nutritional supplements) .... Take 1 can (250 calories/can) two times a day per peg tube. dx: 536.3-gastroparesis, 787.01-nausea and vomiting 11)  Reglan 5 Mg Tabs (Metoclopramide hcl) .... Take 1 tablet by mouth three times a day before meals 12)  Furosemide 20 Mg Tabs (Furosemide) .[name].. 1 by mouth qd 13)  Nitrostat 0.4 Mg Subl (Nitroglycerin) .... By mouth 14)  Amiodarone Hcl 200 Mg Tabs (Amiodarone hcl) .[name].. 1 by mouth daily 15)  Carvedilol 6.25 Mg Tabs (Carvedilol) .... 2 by mouth daily 16)   Diflucan 100 Mg Tabs (Fluconazole) .... Take 1 tablet by mouth once a day x 3 days  Other Orders: Est. Patient Level III (65784) DME Referral (DME)  Patient Instructions: 1)  Will set up oxygen test at night 2)  Follow up in 6 months   Immunization History:  Influenza Immunization History:    Influenza:  historical (12/17/2009)  Pneumovax Immunization History:    Pneumovax:  historical (12/17/2009)

## 2010-04-18 NOTE — Assessment & Plan Note (Signed)
Summary: roa 62month.cbs   Vital Signs:  Patient Profile:   65 Years Old Female Height:     62 inches Weight:      99.4 pounds Temp:     98.0 degrees F oral Pulse rate:   80 / minute BP sitting:   110 / 70  (left arm)  Pt. in pain?   no  Vitals Entered By: Jeremy Johann CMA (December 08, 2007 11:04 AM)                  PCP:  Loreen Freud, M.D.  Chief Complaint:  follow up, refills, and .  History of Present Illness: Pt here for f/u and refills.  No complaints.    Current Allergies (reviewed today): ! TALWIN ! SULFA  Past Medical History:    Reviewed history from 05/30/2007 and no changes required:       Anemia-iron deficiency       Hypertension       Osteoarthritis       GERD  Past Surgical History:    Reviewed history from 08/07/2007 and no changes required:       Intestinal resection (5times)       Hysterectomy-complete       Appendectomy       Bladder lift       Tonsillectomy       Lysis of intestinal adhesions-numerous surgeries       PICC Line   Family History:    Reviewed history from 10/09/2007 and no changes required:       Family History of Heart Disease: Father       Family History of CVA: Mother       No FH of Colon Cancer:  Social History:    Reviewed history from 10/09/2007 and no changes required:       Occupation: Disabled       Alcohol Use - no       Illicit Drug Use - no       Patient is a former smoker.        Daily Caffeine Use   Risk Factors: Tobacco use:  quit    Year quit:  20 years ago Drug use:  no Caffeine use:  1 drinks per day Alcohol use:  no   Review of Systems      See HPI   Physical Exam  General:     Well-developed,well-nourished,in no acute distress; alert,appropriate and cooperative throughout examination Neck:     No deformities, masses, or tenderness noted. Lungs:     Normal respiratory effort, chest expands symmetrically. Lungs are clear to auscultation, no crackles or wheezes. Heart:  normal rate, regular rhythm, and no murmur.   Extremities:     No clubbing, cyanosis, edema, or deformity noted with normal full range of motion of all joints.   Skin:     Intact without suspicious lesions or rashes Cervical Nodes:     No lymphadenopathy noted Psych:     Cognition and judgment appear intact. Alert and cooperative with normal attention span and concentration. No apparent delusions, illusions, hallucinations    Impression & Recommendations:  Problem # 1:  HYPERTENSION, BENIGN ESSENTIAL (ICD-401.1)  Her updated medication list for this problem includes:    Cardura 4 Mg Tabs (Doxazosin mesylate) .[name]... Take one tablet daily    Norvasc 10 Mg Tabs (Amlodipine besylate) .[name]... 1 by mouth qd  Orders: Venipuncture (55732) TLB-B12 + Folate Pnl (20254_27062-B76/EGB) TLB-IBC Pnl (Iron/FE;Transferrin) (83550-IBC) TLB-Lipid Panel (  80061-LIPID) TLB-BMP (Basic Metabolic Panel-BMET) (80048-METABOL) TLB-CBC Platelet - w/Differential (85025-CBCD) TLB-TSH (Thyroid Stimulating Hormone) (84443-TSH) TLB-Hepatic/Liver Function Pnl (80076-HEPATIC) T-Vitamin D (25-Hydroxy) (16109-60454)  BP today: 110/70 Prior BP: 108/60 (10/09/2007)  Labs Reviewed: Creat: 0.9 (08/26/2007) Chol: 139 (03/11/2006)   HDL: 38.5 (03/11/2006)   LDL: 85 (03/11/2006)   TG: 76 (03/11/2006)   Problem # 2:  ATRIAL FIBRILLATION (ICD-427.31)  Her updated medication list for this problem includes:    Flecainide Acetate 50 Mg Tabs (Flecainide acetate) .[name]... 1 tablet by mouth every morning and 1/2 tablet po every night  Orders: Venipuncture (09811) TLB-B12 + Folate Pnl (91478_29562-Z30/QMV) TLB-IBC Pnl (Iron/FE;Transferrin) (83550-IBC) TLB-Lipid Panel (80061-LIPID) TLB-BMP (Basic Metabolic Panel-BMET) (80048-METABOL) TLB-CBC Platelet - w/Differential (85025-CBCD) TLB-TSH (Thyroid Stimulating Hormone) (84443-TSH) TLB-Hepatic/Liver Function Pnl (80076-HEPATIC) T-Vitamin D (25-Hydroxy)  (78469-62952)   Problem # 3:  GASTROPARESIS (ICD-536.3) per GI  Problem # 4:  NAUSEA, CHRONIC (ICD-787.02)  Her updated medication list for this problem includes:    Promethazine Hcl 25 Mg Tabs (Promethazine hcl) .[name]... Take one tablet  q 4-6 hrs as needed for nausea Discussed symptom control.   Problem # 5:  TACHYCARDIA, HX OF (ICD-V12.50) Assessment: Improved  Problem # 6:  CHRONIC PAIN SYNDROME (ICD-338.4)  Problem # 7:  FIBROMYALGIA (ICD-729.1)  Her updated medication list for this problem includes:    Endocet 10-325 Mg Tabs (Oxycodone-acetaminophen) .[name]... Take one tablet every 6-8 hours as needed for pain.    Fentanyl 50 Mcg/hr Pt72 (Fentanyl) .[name]... Change patch every three days    Celebrex 200 Mg Caps (Celecoxib) .[name]... Take one tablet twice daily    Cyclobenzaprine Hcl 10 Mg Tabs (Cyclobenzaprine hcl) .[name]... Take one tablet three times daily   Complete Medication List: 1)  Endocet 10-325 Mg Tabs (Oxycodone-acetaminophen) .... Take one tablet every 6-8 hours as needed for pain. 2)  Fentanyl 50 Mcg/hr Pt72 (Fentanyl) .... Change patch every three days 3)  Omeprazole 20 Mg Cpdr (Omeprazole) .... Take one tablet daily as directed 4)  Cardura 4 Mg Tabs (Doxazosin mesylate) .... Take one tablet daily 5)  Enablex 7.5 Mg Tb24 (Darifenacin hydrobromide) .... Take daily as directed 6)  Amitriptyline Hcl 25 Mg Tabs (Amitriptyline hcl) .... Take 2 tablets every evening at bedtime 7)  Celebrex 200 Mg Caps (Celecoxib) .... Take one tablet twice daily 8)  Temazepam 15 Mg Caps (Temazepam) .... Take one to two capsules at bedtime 9)  Cyclobenzaprine Hcl 10 Mg Tabs (Cyclobenzaprine hcl) .... Take one tablet three times daily 10)  Gabapentin 600 Mg Tabs (Gabapentin) .... Take one tablet 4 times daily 11)  Norvasc 10 Mg Tabs (Amlodipine besylate) .[name].. 1 by mouth qd 12)  Promethazine Hcl 25 Mg Tabs (Promethazine hcl) .... Take one tablet  q 4-6 hrs as needed for nausea 13)  Fosamax 70 Mg Tabs  (Alendronate sodium) .... Take as directed weekly 14)  Flecainide Acetate 50 Mg Tabs (Flecainide acetate) .[name].. 1 tablet by mouth every morning and 1/2 tablet po every night  Other Orders: Flu Vaccine 76yrs + (84132) Admin of Therapeutic Inj (IM or [place]) (44010)    Prescriptions: CYCLOBENZAPRINE HCL 10 MG  TABS (CYCLOBENZAPRINE HCL) Take one tablet three times daily  #90 x 3   Entered and Authorized by:   Loreen Freud DO   Signed by:   Loreen Freud DO on 12/08/2007   Method used:   Faxed to ...       [facility] (retail)       [address]  [address]       [place], [place]  [address]       Ph: [phone]       Fax: [phone]   RxID:   [id number] PROMETHAZINE HCL 25 MG  TABS (PROMETHAZINE HCL) Take one tablet  q 4-6 hrs as needed for nausea  #30 x 3   Entered and Authorized by:   Loreen Freud DO   Signed by:   Loreen Freud DO on 12/08/2007   Method used:   Electronically to        [facility] (retail)       [address]       [place], [place]  [address]       Ph: [phone]       Fax: [phone]   RxID:   [id number] CYCLOBENZAPRINE HCL 10 MG  TABS (CYCLOBENZAPRINE HCL) Take one tablet three times daily  #90 x 3   Entered and Authorized by:   Loreen Freud DO   Signed by:   Loreen Freud DO on 12/08/2007   Method used:   Electronically to        [facility] (retail)       [address]       [place], [place]  [address]       Ph: [phone]       Fax: [phone]   RxID:   [id number]  ] Flu Vaccine Consent Questions     Do you have a history of severe allergic reactions to this vaccine? no    Any prior history of allergic reactions to egg and/or gelatin? no    Do you have a sensitivity to the preservative Thimersol? no    Do you have a past history of Guillan-Barre Syndrome? no    Do you currently have an acute febrile illness? no    Have you ever had a severe reaction to latex? no     Vaccine information given and explained to patient? yes    Are you currently pregnant? no    Lot Number:[id number]   Site Given  Right Deltoid IM

## 2010-04-18 NOTE — Progress Notes (Signed)
Summary: Fentanyl/Endocet RF  Phone Note Refill Request Message from:  Patient on December 28, 2009 11:09 AM  Refills Requested: Medication #1:  FENTANYL 50 MCG/HR  PT72 change patch every three days  Medication #2:  ENDOCET 10-325 MG  TABS Take one tablet every 6-8 hours as needed for pain. Last seen and Rx last filled 11/15/09   Method Requested: Pick up at Office Initial call taken by: Almeta Monas CMA Duncan Dull),  December 28, 2009 11:09 AM  Follow-up for Phone Call        refill x1 each Follow-up by: Loreen Freud DO,  December 28, 2009 12:06 PM    Prescriptions: FENTANYL 50 MCG/HR  PT72 (FENTANYL) change patch every three days  #10 x 0   Entered by:   Almeta Monas CMA (AAMA)   Authorized by:   Loreen Freud DO   Signed by:   Almeta Monas CMA (AAMA) on 12/28/2009   Method used:   Print then Give to Patient   RxID:   [id number] ENDOCET 10-325 MG  TABS (OXYCODONE-ACETAMINOPHEN) Take one tablet every 6-8 hours as needed for pain.  #120 x 0   Entered by:   Almeta Monas CMA (AAMA)   Authorized by:   Loreen Freud DO   Signed by:   Almeta Monas CMA (AAMA) on 12/28/2009   Method used:   Print then Give to Patient   RxID:   [id number]   Appended Document: Fentanyl/Endocet RF pt aware Rx ready in the morning for pick up

## 2010-04-18 NOTE — Progress Notes (Signed)
Summary: REFILL FOR AMLODIPINE  Phone Note Refill Request   Refills Requested: Medication #1:  amlodipine besylate 10 mg tab # 30 take 1 tab each day   Last Refilled: 02/11/2007 RX RECEIVED VIA FAX FROM [facility] FAX IS [phone]  Initial call taken by: Job Founds,  March 28, 2007 12:16 PM  Follow-up for Phone Call        Done earlier today electronically ...................................................................Ardyth Man  March 28, 2007 12:25 PM  Follow-up by: Ardyth Man,  March 28, 2007 12:25 PM

## 2010-04-18 NOTE — Assessment & Plan Note (Signed)
Summary: cough/ mbw   Copy to:  Dr. Laury Axon Primary Provider/Referring Provider:  Loreen Freud DO  CC:  cough for 1 week.[name]  History of Present Illness: 65 yo female complicated WF with known history of  recurrent pneumonia, and COPD.   August 27, 2008--Presents for an acute office visit.  Complains over last week of increased cough, congestion, thick mucous, dyspnea. Mucous is very thick.  Worse over last 2 days.   Mucinex dm not helping. Denies chest pain,  orthopnea, hemoptysis, fever, n/v/d, edema, headache.  Current Medications (verified): 1)  Endocet 10-325 Mg  Tabs (Oxycodone-Acetaminophen) .... Take One Tablet Every 6-8 Hours As Needed For Pain. 2)  Fentanyl 50 Mcg/hr  Pt72 (Fentanyl) .... Change Patch Every Three Days 3)  Prilosec 40 Mg Cpdr (Omeprazole) .... Take 1 Tablet By Mouth Once A Day 4)  Cardura 4 Mg  Tabs (Doxazosin Mesylate) .... Take One Tablet Daily 5)  Amitriptyline Hcl 25 Mg  Tabs (Amitriptyline Hcl) .... Take 2 Tablets Every Evening At Bedtime As Needed 6)  Mobic 15 Mg Tabs (Meloxicam) .... Take 1/2 - 1 By Mouth Once Daily 7)  Temazepam 15 Mg  Caps (Temazepam) .... Take One To Two Capsules At Bedtime 8)  Cyclobenzaprine Hcl 10 Mg  Tabs (Cyclobenzaprine Hcl) .... Take One Tablet Three Times Daily 9)  Gabapentin 600 Mg  Tabs (Gabapentin) .... Take One Tablet 4 Times Daily 10)  Norvasc 10 Mg  Tabs (Amlodipine Besylate) .[name].. 1 By Mouth Qd 11)  Promethazine Hcl 25 Mg  Tabs (Promethazine Hcl) .... Take One Tablet  Q 4-6 Hrs As Needed For Nausea 12)  Flecainide Acetate 50 Mg  Tabs (Flecainide Acetate) .[name].. 1 Tablet By Mouth Every Morning and 1/2 Tablet Po Every Night 13)  Spiriva Handihaler 18 Mcg Caps (Tiotropium Bromide Monohydrate) .... One Puff Once Daily 14)  Ventolin Hfa 108 (90 Base) Mcg/act Aers (Albuterol Sulfate) .... Two Puffs Up To Four Times Per Day As Needed  Allergies (verified): 1)  ! Talwin 2)  ! Sulfa  Past History:  Past Medical History: Last  updated: 07/29/2008 Anemia-iron deficiency Hypertension Osteoarthritis GERD CAD Left bundle branch block Atrial fibrillation - Dr. Roger Shelter Chronic pain Fibromyalgia Diverticulosis, colon COPD/Emphysema      -06/28/08 PFT FEV1 1.95 (97%), FVC 2.79 (101%), FEV1% 70, TLC 3.88 (87%), DLCO 64% Pneumonia      - 03/09, 12/09, 02/10 Exudative pleural effusion s/p right thoracentesis 03/09  Past Surgical History: Last updated: 05/12/2008 Intestinal resection (5times) Hysterectomy-complete Appendectomy Bladder lift Tonsillectomy Lysis of intestinal adhesions-numerous surgeries PICC Line TAH  Family History: Last updated: 05/12/2008 Family History of Heart Disease: Father Family History of CVA: Mother No FH of Colon Cancer:  Social History: Last updated: 05/12/2008 Occupation: Disabled Alcohol Use - no Illicit Drug Use - no Patient is a former smoker.  Daily Caffeine Use  Risk Factors: Smoking Status: quit (10/09/2007)  Review of Systems      See HPI  Vital Signs:  Patient profile:   65 year old female Height:      62 inches Weight:      96 pounds O2 Sat:      92 % on Room air Temp:     98.2 degrees F oral Pulse rate:   105 / minute BP sitting:   108 / 70  (right arm) Cuff size:   regular  Vitals Entered By: Marinus Maw (August 27, 2008 11:42 AM)  O2 Flow:  Room air CC: cough for 1  week. Comments Medications reviewed with patient Marinus Maw  August 27, 2008 11:43 AM    Physical Exam  Additional Exam:  GEN: A/Ox3; pleasant , NAD HEENT:  [place]/AT, , EACs-clear, TMs-wnl, NOSE-clear, THROAT-clear NECK:  Supple w/ fair ROM; no JVD; normal carotid impulses w/o bruits; no thyromegaly or nodules palpated; no lymphadenopathy. RESP  Coarse BS w/ no wheeizng, few rhonchi CARD:  RRR, no m/r/g   GI:   Soft & nt; nml bowel sounds; no organomegaly or masses detected. [place]: Warm bil,  no calf tenderness edema, clubbing, pulses intact Neuro: no  focal deficits.    Impression & Recommendations:  Problem # 1:  COPD (ICD-496) Exacerbation-xopenex neb given.  REC: Avelox 400mg  once daily for 7 days (samples given)  Mucinex DM two times a day as needed cough/congestion Increase fluids.  follow up Dr. Craige Cotta 3-4 weeks.  Please contact office for sooner follow up if symptoms do not improve or worsen   Complete Medication List: 1)  Endocet 10-325 Mg Tabs (Oxycodone-acetaminophen) .... Take one tablet every 6-8 hours as needed for pain. 2)  Fentanyl 50 Mcg/hr Pt72 (Fentanyl) .... Change patch every three days 3)  Prilosec 40 Mg Cpdr (Omeprazole) .... Take 1 tablet by mouth once a day 4)  Cardura 4 Mg Tabs (Doxazosin mesylate) .... Take one tablet daily 5)  Amitriptyline Hcl 25 Mg Tabs (Amitriptyline hcl) .... Take 2 tablets every evening at bedtime as needed 6)  Mobic 15 Mg Tabs (Meloxicam) .... Take 1/2 - 1 by mouth once daily 7)  Temazepam 15 Mg Caps (Temazepam) .... Take one to two capsules at bedtime 8)  Cyclobenzaprine Hcl 10 Mg Tabs (Cyclobenzaprine hcl) .... Take one tablet three times daily 9)  Gabapentin 600 Mg Tabs (Gabapentin) .... Take one tablet 4 times daily 10)  Norvasc 10 Mg Tabs (Amlodipine besylate) .[name].. 1 by mouth qd 11)  Promethazine Hcl 25 Mg Tabs (Promethazine hcl) .... Take one tablet  q 4-6 hrs as needed for nausea 12)  Flecainide Acetate 50 Mg Tabs (Flecainide acetate) .[name].. 1 tablet by mouth every morning and 1/2 tablet po every night 13)  Spiriva Handihaler 18 Mcg Caps (Tiotropium bromide monohydrate) .... One puff once daily 14)  Ventolin Hfa 108 (90 Base) Mcg/act Aers (Albuterol sulfate) .... Two puffs up to four times per day as needed  Other Orders: Nebulizer Tx (04540) Est. Patient Level IV (98119)  Patient Instructions: 1)  Avelox 400mg  once daily for 7 days 2)  Mucinex DM two times a day as needed cough/congestion 3)  Increase fluids.  4)  follow up Dr. Craige Cotta 3-4 weeks.  5)  Please contact office  for sooner follow up if symptoms do not improve or worsen    Medication Administration  Medication # 1:    Medication: Xopenex 1.25mg     Diagnosis: PNEUMONIA (ICD-486)    Dose: 1 vial    Route: inhaled    Exp Date: 08-2009    Lot #: [id number]    Mfr: SEPRACOR    Patient tolerated medication without complications    Given by: Zackery Barefoot CMA (August 27, 2008 12:14 PM)  Orders Added: 1)  Nebulizer Tx [94640] 2)  Est. Patient Level IV [62130]

## 2010-04-18 NOTE — Assessment & Plan Note (Signed)
Summary: rov//mbw   Copy to:  Dr. Laury Axon Primary Provider/Referring Provider:  Loreen Freud, M.D.  CC:  Follow-up for pneumonia/bronchitis.  Pt c/o a persistant cough and with and without mucus production for several months..  History of Present Illness: I met [name] for evaluation of her recurrent pneumonia, and to review her CT chest results.  The CT chest from 05/18/08 showed right middle lobe haziness which would be consistant with resolving pneumonia in this area.  She continues to have cough with white sputum.  She does have problems with swallowing, and gets a cough sometimes when she eats.  She has not had fever, hemoptysis, chest tightness, or wheezing.  She has not felt the need to use her inhalers.   Current Medications (verified): 1)  Endocet 10-325 Mg  Tabs (Oxycodone-Acetaminophen) .... Take One Tablet Every 6-8 Hours As Needed For Pain. 2)  Fentanyl 50 Mcg/hr  Pt72 (Fentanyl) .... Change Patch Every Three Days 3)  Prilosec 40 Mg Cpdr (Omeprazole) .... Take 1 Tablet By Mouth Once A Day 4)  Cardura 4 Mg  Tabs (Doxazosin Mesylate) .... Take One Tablet Daily 5)  Enablex 7.5 Mg  Tb24 (Darifenacin Hydrobromide) .... Take Daily As Directed 6)  Amitriptyline Hcl 25 Mg  Tabs (Amitriptyline Hcl) .... Take 2 Tablets Every Evening At Bedtime 7)  Celebrex 200 Mg  Caps (Celecoxib) .... Take One Tablet Twice Daily 8)  Temazepam 15 Mg  Caps (Temazepam) .... Take One To Two Capsules At Bedtime 9)  Cyclobenzaprine Hcl 10 Mg  Tabs (Cyclobenzaprine Hcl) .... Take One Tablet Three Times Daily 10)  Gabapentin 600 Mg  Tabs (Gabapentin) .... Take One Tablet 4 Times Daily 11)  Norvasc 10 Mg  Tabs (Amlodipine Besylate) .[name].. 1 By Mouth Qd 12)  Promethazine Hcl 25 Mg  Tabs (Promethazine Hcl) .... Take One Tablet  Q 4-6 Hrs As Needed For Nausea 13)  Flecainide Acetate 50 Mg  Tabs (Flecainide Acetate) .[name].. 1 Tablet By Mouth Every Morning and 1/2 Tablet Po Every Night 14)  Vitamin D 16109 Unit Caps  (Ergocalciferol) .... Take One Tablet Weekly  Allergies (verified): 1)  ! Talwin 2)  ! Sulfa  Past History:  Past Medical History:    Anemia-iron deficiency    Hypertension    Osteoarthritis    GERD    CAD    Left bundle branch block    Atrial fibrillation - Dr. Roger Shelter    Chronic pain    Fibromyalgia    Diverticulosis, colon    Pneumonia         - 03/09, 12/09, 02/10    Exudative pleural effusion s/p right thoracentesis 03/09  Past Surgical History:    Reviewed history from 05/12/2008 and no changes required:    Intestinal resection (5times)    Hysterectomy-complete    Appendectomy    Bladder lift    Tonsillectomy    Lysis of intestinal adhesions-numerous surgeries    PICC Line    TAH  Vital Signs:  Patient profile:   65 year old female Height:      62 inches (157.48 cm) Weight:      99 pounds (45.00 kg) BMI:     18.17 O2 Sat:      94 % Temp:     98.3 degrees F (36.83 degrees C) oral Pulse rate:   101 / minute BP sitting:   108 / 74  (right arm) Cuff size:   regular  Vitals Entered By: Michel Bickers CMA (June 10, 2008 4:04 PM)  O2 Sat at Rest %:  94 O2 Flow:  room air  Physical Exam  General:  thin.   Nose:  no deformity, discharge, inflammation, or lesions Mouth:  no deformity or lesions Neck:  no JVD.   Lungs:  no wheezing or rales Heart:  regular rhythm, normal rate, and no murmurs.   Abdomen:  thin, soft, no masses, non-tender, normal bowel sounds Extremities:  No clubbing, cyanosis, edema, or deformity noted with normal full range of motion of all joints.   Cervical Nodes:  no significant adenopathy   Pulmonary Function Test Height (in.): 62 Gender: Female  Impression & Recommendations:  Problem # 1:  PNEUMONIA (ICD-486) She has recurrent pneumonia.  Her CT chest was relatively unremarkable.  Specifically, I don't think there is an indication for bronchoscopy at this time.  I will check a chest xray today to further document clearing  or her right middle lobe infiltrate.  She is to continue on as needed albuterol.  I will schedule her for pulmonary function testing to further assess her lung mechanics.  She does complain of difficulty with swallowing.  This raises the possibility that she may be aspirating as a cause of her recurrent pneumonia.  I have advised her to discuss this further with primary care and GI to determine what further investigations are needed to evaluate this.  Complete Medication List: 1)  Endocet 10-325 Mg Tabs (Oxycodone-acetaminophen) .... Take one tablet every 6-8 hours as needed for pain. 2)  Fentanyl 50 Mcg/hr Pt72 (Fentanyl) .... Change patch every three days 3)  Prilosec 40 Mg Cpdr (Omeprazole) .... Take 1 tablet by mouth once a day 4)  Cardura 4 Mg Tabs (Doxazosin mesylate) .... Take one tablet daily 5)  Enablex 7.5 Mg Tb24 (Darifenacin hydrobromide) .... Take daily as directed 6)  Amitriptyline Hcl 25 Mg Tabs (Amitriptyline hcl) .... Take 2 tablets every evening at bedtime 7)  Celebrex 200 Mg Caps (Celecoxib) .... Take one tablet twice daily 8)  Temazepam 15 Mg Caps (Temazepam) .... Take one to two capsules at bedtime 9)  Cyclobenzaprine Hcl 10 Mg Tabs (Cyclobenzaprine hcl) .... Take one tablet three times daily 10)  Gabapentin 600 Mg Tabs (Gabapentin) .... Take one tablet 4 times daily 11)  Norvasc 10 Mg Tabs (Amlodipine besylate) .[name].. 1 by mouth qd 12)  Promethazine Hcl 25 Mg Tabs (Promethazine hcl) .... Take one tablet  q 4-6 hrs as needed for nausea 13)  Flecainide Acetate 50 Mg Tabs (Flecainide acetate) .[name].. 1 tablet by mouth every morning and 1/2 tablet po every night 14)  Vitamin D 16109 Unit Caps (Ergocalciferol) .... Take one tablet weekly  Other Orders: T-2 View CXR, Same Day (71020.5TC) Est. Patient Level II (60454) Full Pulmonary Function Test (PFT)  Patient Instructions: 1)  Try using inhaler as needed for cough 2)  Will schedule breathing test (PFT) 3)  Chest xray  today 4)  Follow up in 2 to 3 weeks

## 2010-04-18 NOTE — Progress Notes (Signed)
Summary: refill  Phone Note Refill Request Message from:  Fax from Pharmacy  Refills Requested: Medication #1:  ZOFRAN 8 MG TABS 1 by mouth q12 h as needed   Brand Name Necessary? No   Last Refilled: 02/17/2009 [facility] ph----[phone]    fax----[phone]  Initial call taken by: Warnell Forester,  April 04, 2009 10:17 AM  Follow-up for Phone Call        ok to refill x1  1 refills Follow-up by: Loreen Freud DO,  April 04, 2009 10:42 AM    Prescriptions: ZOFRAN 8 MG TABS (ONDANSETRON HCL) 1 by mouth q12 h as needed  #20 x 0   Entered by:   Army Fossa CMA   Authorized by:   Loreen Freud DO   Signed by:   Army Fossa CMA on 04/04/2009   Method used:   Electronically to        [facility] (retail)       [address]       [place], [place]  [id number]       Ph: [phone]       Fax: [phone]   RxID:   [id number]

## 2010-04-18 NOTE — Progress Notes (Signed)
Summary: GI tube concern   Phone Note Call from Patient Call back at Home Phone [phone]   Caller: Patient Call For: Dr. Juanda Chance  Reason for Call: Talk to Nurse Summary of Call: misfitting with tube part Initial call taken by: Vallarie Mare,  February 06, 2010 9:38 AM  Follow-up for Phone Call        Patient had tube changed on Friday. States that  a smaller tube was placed and Dr. Juanda Chance told her to call if she had a lot of drainage because she might need a 25 French tube. Patient calling to report a lot of drainage around the tube over the weekend. Please, advise. Follow-up by: Jesse Fall RN,  February 06, 2010 10:21 AM  Additional Follow-up for Phone Call Additional follow up Details #1::        I have spoken to the pt. She has a 2.5 cm 84F buttin gastrostomy. The ols PEG was 58F. Now the new one is leaking around ( not much). I have called [facility] to see if 58F Button gastrostomy exists Additional Follow-up by: Hart Carwin MD,  February 06, 2010 1:32 PM

## 2010-04-18 NOTE — Letter (Signed)
Summary: Results Follow-up Letter  [place] [facility]  [address] [place], [place] [address]   Phone: [phone]  Fax: [phone]    03/03/2008        [name] [address] [place], [place]  [address]  Dear Ms. Constancio,   The following are the results of your recent test(s):  Test     Result     Pap Smear    Normal_______  Not Normal_____       Comments: _________________________________________________________ Cholesterol LDL(Bad cholesterol):          Your goal is less than:         HDL (Good cholesterol):        Your goal is more than: _________________________________________________________ Other Tests:   _________________________________________________________  Please call for an appointment Or __Please see attached labwork._______________________________________________________ _________________________________________________________ _________________________________________________________  Sincerely,  Ardyth Man [place] at [facility]

## 2010-04-18 NOTE — Letter (Signed)
Summary: Hanicapped Placard  Hanicapped Placard   Imported By: Freddy Jaksch 07/18/2007 10:35:57  _____________________________________________________________________  External Attachment:    Type:   Image     Comment:   External Document

## 2010-04-18 NOTE — Assessment & Plan Note (Signed)
Summary: med check//lch   Vital Signs:  Patient profile:   65 year old female Weight:      90 pounds Pulse rate:   85 / minute Pulse rhythm:   regular BP sitting:   106 / 70  (left arm) Cuff size:   regular  Vitals Entered By: Army Fossa CMA (September 07, 2009 9:35 AM) CC: Pt here for med check, having stiffness in her neck and should since saturday. Comments refill on temazepam   History of Present Illness: Pt here for f/u and med refill.   Hypertension follow-up      This is a 65 year old woman who presents for Hypertension follow-up.  The patient denies lightheadedness, urinary frequency, headaches, edema, impotence, rash, and fatigue.  The patient denies the following associated symptoms: chest pain, chest pressure, exercise intolerance, dyspnea, palpitations, syncope, leg edema, and pedal edema.  Compliance with medications (by patient report) has been near 100%.  The patient reports that dietary compliance has been good.  The patient reports exercising daily.  Adjunctive measures currently used by the patient include salt restriction.    Current Medications (verified): 1)  Spiriva Handihaler 18 Mcg Caps (Tiotropium Bromide Monohydrate) .... One Puff Once Daily 2)  Ventolin Hfa 108 (90 Base) Mcg/act Aers (Albuterol Sulfate) .... Two Puffs Up To Four Times Per Day As Needed 3)  Endocet 10-325 Mg  Tabs (Oxycodone-Acetaminophen) .... Take One Tablet Every 6-8 Hours As Needed For Pain. 4)  Fentanyl 50 Mcg/hr  Pt72 (Fentanyl) .... Change Patch Every Three Days 5)  Mobic 15 Mg Tabs (Meloxicam) .... Take 1/2 - 1 By Mouth Once Daily 6)  Prilosec 40 Mg Cpdr (Omeprazole) .... Take 1 Tablet By Mouth Once A Day 7)  Norvasc 5 Mg Tabs (Amlodipine Besylate) .[name].. 1 By Mouth Once Daily 8)  Flecainide Acetate 50 Mg  Tabs (Flecainide Acetate) .[name].. 1 Tablet By Mouth Every Morning and 1/2 Tablet Po Every Night 9)  Temazepam 15 Mg  Caps (Temazepam) .... Take One To Two Capsules At Bedtime 10)   Cyclobenzaprine Hcl 10 Mg  Tabs (Cyclobenzaprine Hcl) .... Take One Tablet Three Times Daily 11)  Gabapentin 600 Mg  Tabs (Gabapentin) .... Take One Tablet 4 Times Daily 12)  Zofran 8 Mg Tabs (Ondansetron Hcl) .[name].. 1 By Mouth Q12 H As Needed 13)  Foradil Aerolizer 12 Mcg Caps (Formoterol Fumarate) .... One Puff Two Times A Day 14)  Resource Breeze  Liqd (Nutritional Supplements) .... Take 1 Can (250 Calories/can) Two Times A Day Per Peg Tube. Dx: 536.3-Gastroparesis, 787.01-Nausea and Vomiting 15)  Reglan 5 Mg Tabs (Metoclopramide Hcl) .... Take 1 Tablet By Mouth Three Times A Day Before Meals  Allergies: 1)  ! Talwin 2)  ! Sulfa  Past History:  Past medical, surgical, family and social histories (including risk factors) reviewed for relevance to current acute and chronic problems.  Past Medical History: Reviewed history from 02/17/2009 and no changes required. Anemia-iron deficiency CHRONIC PARTIAL SMALL BOWEL OBSTRUCTION Hypertension Osteoarthritis GERD CAD Left bundle branch block Atrial fibrillation - Dr. Roger Shelter Chronic pain Fibromyalgia Diverticulosis, colon COPD/Emphysema      -06/28/08 PFT FEV1 1.95 (97%), FVC 2.79 (101%), FEV1% 70, TLC 3.88 (87%), DLCO 64% Pneumonia      - 03/09, 12/09, 02/10, 06/10 Exudative pleural effusion s/p right thoracentesis 03/09  Past Surgical History: Intestinal resection (5times),S/P PARTIAL COLECTOMY 2001 Hysterectomy-complete Appendectomy Bladder lift Tonsillectomy Lysis of intestinal adhesions-numerous surgeries/LAST 2003 PICC Line TAH Intestinal surgery again 04/27/2009 06/2009--changed  colostomy tube---Dr Dwain Sarna  Family History: Reviewed history from 05/12/2008 and no changes required. Family History of Heart Disease: Father Family History of CVA: Mother No FH of Colon Cancer:  Social History: Reviewed history from 04/07/2009 and no changes required. Occupation: Disabled Alcohol Use - no Illicit Drug Use -  no Patient is a former smoker, quit 1988 x93yrs, <1ppd.  Daily Caffeine Use  Review of Systems      See HPI  Physical Exam  General:  Well-developed,well-nourished,in no acute distress; alert,appropriate and cooperative throughout examination Neck:  No deformities, masses, or tenderness noted. Lungs:  Normal respiratory effort, chest expands symmetrically. Lungs are clear to auscultation, no crackles or wheezes. Heart:  normal rate and no murmur.   Psych:  Oriented X3 and normally interactive.     Impression & Recommendations:  Problem # 1:  COPD (ICD-496)  Her updated medication list for this problem includes:    Spiriva Handihaler 18 Mcg Caps (Tiotropium bromide monohydrate) ..... One puff once daily    Ventolin Hfa 108 (90 Base) Mcg/act Aers (Albuterol sulfate) .[name]..[name] Two puffs up to four times per day as needed    Foradil Aerolizer 12 Mcg Caps (Formoterol fumarate) ..... One puff two times a day  Pulmonary Functions Reviewed: FEV1: 1.95 (06/28/2008)   FEV 25-75: 0.99 (06/28/2008)   O2 sat: 98 (04/07/2009)     Vaccines Reviewed: Flu Vax: Fluvax 3+ (12/28/2008)  Problem # 2:  ATRIAL FIBRILLATION (ICD-427.31)  Her updated medication list for this problem includes:    Norvasc 5 Mg Tabs (Amlodipine besylate) .[name]... 1 by mouth once daily    Flecainide Acetate 50 Mg Tabs (Flecainide acetate) .[name]... 1 tablet by mouth every morning and 1/2 tablet po every night  Reviewed the following: PT: 13.3 (11/17/2008)   INR: 1.0 (11/17/2008)  Problem # 3:  CORONARY ARTERY DISEASE (ICD-414.00)  Her updated medication list for this problem includes:    Norvasc 5 Mg Tabs (Amlodipine besylate) .[name]... 1 by mouth once daily    Flecainide Acetate 50 Mg Tabs (Flecainide acetate) .[name]... 1 tablet by mouth every morning and 1/2 tablet po every night  Labs Reviewed: Chol: 160 (12/09/2007)   HDL: 44.4 (12/09/2007)   LDL: 100 (12/09/2007)   TG: 76 (12/09/2007)  Problem # 4:  GASTROPARESIS  (ICD-536.3) per gi  Problem # 5:  COPD (ICD-496)  Her updated medication list for this problem includes:    Spiriva Handihaler 18 Mcg Caps (Tiotropium bromide monohydrate) ..... One puff once daily    Ventolin Hfa 108 (90 Base) Mcg/act Aers (Albuterol sulfate) .[name]..[name] Two puffs up to four times per day as needed    Foradil Aerolizer 12 Mcg Caps (Formoterol fumarate) ..... One puff two times a day  Pulmonary Functions Reviewed: FEV1: 1.95 (06/28/2008)   FEV 25-75: 0.99 (06/28/2008)   O2 sat: 98 (04/07/2009)     Vaccines Reviewed: Flu Vax: Fluvax 3+ (12/28/2008)  Problem # 6:  CHRONIC PAIN SYNDROME (ICD-338.4) refill pain meds secondary to scar tissue, OA and fibro rx given early because I will be out of town next week.  Problem # 7:  OSTEOPOROSIS (ICD-733.00)  Orders: Radiology Referral (Radiology)  Vit D:18 (02/15/2009), 57 (01/15/2008)  Problem # 8:  HYPERTENSION, BENIGN ESSENTIAL (ICD-401.1)  Her updated medication list for this problem includes:    Norvasc 5 Mg Tabs (Amlodipine besylate) .[name]... 1 by mouth once daily  BP today: 106/70 Prior BP: 102/64 (07/21/2009)  Labs Reviewed: K+: 4.7 (07/21/2009) Creat: : 0.7 (07/21/2009)  Chol: 160 (12/09/2007)   HDL: 44.4 (12/09/2007)   LDL: 100 (12/09/2007)   TG: 76 (12/09/2007)  Problem # 9:  ANEMIA-IRON DEFICIENCY (ICD-280.9)  per gi  Hgb: 10.0 (08/22/2009)   Hct: 29.7 (08/22/2009)   Platelets: 277.0 (08/22/2009) RBC: 3.41 (08/22/2009)   RDW: 15.8 (08/22/2009)   WBC: 8.1 (08/22/2009) MCV: 87.1 (08/22/2009)   MCHC: 33.8 (08/22/2009) Ferritin: 342.2 (12/28/2008) Iron: 40 (02/15/2009)   % Sat: 12.6 (02/15/2009) B12: 679 (07/21/2009)   Folate: 8.4 (02/15/2009)   TSH: 1.05 (02/15/2009)  Problem # 10:  CORONARY ARTERY DISEASE (ICD-414.00)  Her updated medication list for this problem includes:    Norvasc 5 Mg Tabs (Amlodipine besylate) .[name]... 1 by mouth once daily    Flecainide Acetate 50 Mg Tabs (Flecainide acetate) .[name]... 1  tablet by mouth every morning and 1/2 tablet po every night  Labs Reviewed: Chol: 160 (12/09/2007)   HDL: 44.4 (12/09/2007)   LDL: 100 (12/09/2007)   TG: 76 (12/09/2007)  Complete Medication List: 1)  Spiriva Handihaler 18 Mcg Caps (Tiotropium bromide monohydrate) .... One puff once daily 2)  Ventolin Hfa 108 (90 Base) Mcg/act Aers (Albuterol sulfate) .... Two puffs up to four times per day as needed 3)  Endocet 10-325 Mg Tabs (Oxycodone-acetaminophen) .... Take one tablet every 6-8 hours as needed for pain. 4)  Fentanyl 50 Mcg/hr Pt72 (Fentanyl) .... Change patch every three days 5)  Mobic 15 Mg Tabs (Meloxicam) .... Take 1/2 - 1 by mouth once daily 6)  Prilosec 40 Mg Cpdr (Omeprazole) .... Take 1 tablet by mouth once a day 7)  Norvasc 5 Mg Tabs (Amlodipine besylate) .[name].. 1 by mouth once daily 8)  Flecainide Acetate 50 Mg Tabs (Flecainide acetate) .[name].. 1 tablet by mouth every morning and 1/2 tablet po every night 9)  Temazepam 15 Mg Caps (Temazepam) .... Take one to two capsules at bedtime 10)  Cyclobenzaprine Hcl 10 Mg Tabs (Cyclobenzaprine hcl) .... Take one tablet three times daily 11)  Gabapentin 600 Mg Tabs (Gabapentin) .... Take one tablet 4 times daily 12)  Zofran 8 Mg Tabs (Ondansetron hcl) .[name].. 1 by mouth q12 h as needed 13)  Foradil Aerolizer 12 Mcg Caps (Formoterol fumarate) .... One puff two times a day 14)  Resource Breeze Liqd (Nutritional supplements) .... Take 1 can (250 calories/can) two times a day per peg tube. dx: 536.3-gastroparesis, 787.01-nausea and vomiting 15)  Reglan 5 Mg Tabs (Metoclopramide hcl) .... Take 1 tablet by mouth three times a day before meals  Patient Instructions: 1)  Please schedule a follow-up appointment in 6 months .  2)  Get a lipid profile done the next time you have a blood draw--either with Dr Juanda Chance or at the Cancer center Prescriptions: FENTANYL 50 MCG/HR  PT72 (FENTANYL) change patch every three days  #10 x 0   Entered and Authorized by:    Loreen Freud DO   Signed by:   Loreen Freud DO on 09/07/2009   Method used:   Print then Give to Patient   RxID:   [id number] ENDOCET 10-325 MG  TABS (OXYCODONE-ACETAMINOPHEN) Take one tablet every 6-8 hours as needed for pain.  #120 x 0   Entered and Authorized by:   Loreen Freud DO   Signed by:   Loreen Freud DO on 09/07/2009   Method used:   Print then Give to Patient   RxID:   [id number] TEMAZEPAM 15 MG  CAPS (TEMAZEPAM) Take one to two capsules at bedtime  #60 x 2  Entered and Authorized by:   Loreen Freud DO   Signed by:   Loreen Freud DO on 09/07/2009   Method used:   Print then Give to Patient   RxID:   [id number]

## 2010-04-18 NOTE — Cardiovascular Report (Signed)
Summary: Office Visit   Office Visit   Imported By: Roderic Ovens 01/05/2010 12:06:37  _____________________________________________________________________  External Attachment:    Type:   Image     Comment:   External Document

## 2010-04-18 NOTE — Progress Notes (Signed)
Summary: refill  Phone Note Refill Request Call back at Work Phone [phone] Message from:  Patient  Refills Requested: Medication #1:  ENDOCET 10-325 MG  TABS Take one tablet every 6-8 hours as needed for pain.   Last Refilled: 07/18/2009  Medication #2:  FENTANYL 50 MCG/HR  PT72 change patch every three days   Last Refilled: 07/18/2009 call when ready  Initial call taken by: Okey Regal Spring,  August 18, 2009 4:52 PM  Follow-up for Phone Call        per dr Laury Axon okay to fill. Army Fossa CMA  August 18, 2009 4:54 PM   Additional Follow-up for Phone Call Additional follow up Details #1::        ok to refill x1 each Additional Follow-up by: Loreen Freud DO,  August 20, 2009 3:23 PM    Prescriptions: FENTANYL 50 MCG/HR  PT72 (FENTANYL) change patch every three days  #10 x 0   Entered by:   Army Fossa CMA   Authorized by:   Loreen Freud DO   Signed by:   Army Fossa CMA on 08/18/2009   Method used:   Print then Give to Patient   RxID:   [id number] ENDOCET 10-325 MG  TABS (OXYCODONE-ACETAMINOPHEN) Take one tablet every 6-8 hours as needed for pain.  #120 x 0   Entered by:   Army Fossa CMA   Authorized by:   Loreen Freud DO   Signed by:   Army Fossa CMA on 08/18/2009   Method used:   Print then Give to Patient   RxID:   [id number]

## 2010-04-18 NOTE — Assessment & Plan Note (Signed)
Summary: F/U .Becky KitchenMarland KitchenEM  Medications Added FLECAINIDE ACETATE 50 MG  TABS (FLECAINIDE ACETATE) 1 tablet by mouth every morning and 1/2 tablet po every night        History of Present Illness Visit Type: follow up Primary GI Ross: [name] Primary Provider: Loreen Ross, M.D. Chief Complaint: f/u Pic Line for bowel obstructions History of Present Illness:   65 year old white female with chronic small bowel obstruction status post a hospitalization on 08/14/07 for partial small bowel obstruction, aspiration pneumonia, and hypoglycemia. She was found to be Hemoccult positive and anemic with hemoglobin of 9.4. Since the hospitalization, the patient has continued to steadily improve. Her weight has increased 3 pounds and she has been able to eat. Her hemoglobin on 08/29/07 was 10. She has a PICC line which she uses for IV fluids and hydration,  she uses  D5 half-normal saline. She has not had to use any IV fluids since her discharge from the hospital.   GI Review of Systems    Reports abdominal pain, acid reflux, nausea, and  vomiting.     Location of  Abdominal pain: LLQ.    Denies belching, bloating, chest pain, dysphagia with liquids, dysphagia with solids, heartburn, loss of appetite, vomiting blood, weight loss, and  weight gain.      Reports fecal incontinence.     Denies anal fissure, black tarry stools, change in bowel habit, constipation, diarrhea, diverticulosis, heme positive stool, hemorrhoids, irritable bowel syndrome, jaundice, light color stool, liver problems, rectal bleeding, and  rectal pain.     Prior Medications Reviewed Using: Patient Recall  Updated Prior Medication List: ENDOCET 10-325 MG  TABS (OXYCODONE-ACETAMINOPHEN) Take one tablet every 6-8 hours as needed for pain. FENTANYL 50 MCG/HR  PT72 (FENTANYL) change patch every three days OMEPRAZOLE 20 MG  CPDR (OMEPRAZOLE) Take one tablet daily as directed CARDURA 4 MG  TABS (DOXAZOSIN MESYLATE) Take one tablet  daily ENABLEX 7.5 MG  TB24 (DARIFENACIN HYDROBROMIDE) Take daily as directed AMITRIPTYLINE HCL 25 MG  TABS (AMITRIPTYLINE HCL) Take 2 tablets every evening at bedtime CELEBREX 200 MG  CAPS (CELECOXIB) Take one tablet twice daily TEMAZEPAM 15 MG  CAPS (TEMAZEPAM) Take one to two capsules at bedtime CYCLOBENZAPRINE HCL 10 MG  TABS (CYCLOBENZAPRINE HCL) Take one tablet three times daily GABAPENTIN 600 MG  TABS (GABAPENTIN) Take one tablet 4 times daily NORVASC 10 MG  TABS (AMLODIPINE BESYLATE) 1 by mouth qd PROMETHAZINE HCL 25 MG  TABS (PROMETHAZINE HCL) Take one tablet 14-6 hrs as needed for nausea FOSAMAX 70 MG  TABS (ALENDRONATE SODIUM) Take as directed weekly FLECAINIDE ACETATE 50 MG  TABS (FLECAINIDE ACETATE) 1 tablet by mouth every morning and 1/2 tablet po every night  Current Allergies (reviewed today): ! TALWIN ! SULFA  Past Medical History:    Reviewed history from 05/30/2007 and no changes required:       Anemia-iron deficiency       Hypertension       Osteoarthritis       GERD  Past Surgical History:    Reviewed history from 08/07/2007 and no changes required:       Intestinal resection (5times)       Hysterectomy-complete       Appendectomy       Bladder lift       Tonsillectomy       Lysis of intestinal adhesions-numerous surgeries       PICC Line   Family History:    Reviewed  history from 08/07/2007 and no changes required:       Family History of Heart Disease: Father       Family History of CVA: Mother       No FH of Colon Cancer:  Social History:    Reviewed history from 08/07/2007 and no changes required:       Occupation: Disabled       Alcohol Use - no       Illicit Drug Use - no       Patient is a former smoker.        Daily Caffeine Use   Risk Factors:  Tobacco use:  quit    Year quit:  20 years ago Caffeine use:  1 drinks per day   Review of Systems       The patient complains of anemia, arthritis/joint pain, back pain, fatigue, heart  rhythm changes, muscle pains/cramps, swelling of feet/legs, urination - excessive, and urine leakage.     Vital Signs:  Patient Profile:   65 Years Old Female Height:     62 inches Weight:      95.38 pounds BMI:     17.51 BSA:     1.40 Pulse rate:   96 / minute Pulse rhythm:   regular BP sitting:   108 / 60  (right arm)  Vitals Entered By: Milford Cage CMA (October 09, 2007 1:44 PM)                  Physical Exam  General:     slim ,chronically ill-appearing, PICC line left arm Neck:     Supple; no masses or thyromegaly. Lungs:     Clear throughout to auscultation. Heart:     Regular rate and rhythm; no murmurs, rubs,  or bruits. Abdomen:     abdomen is distended height tympanitic but nontender with hyperactive bowel sounds Rectal:     stool is Hemoccult negative Extremities:     No clubbing, cyanosis, edema or deformities noted. Neurologic:     Alert and  oriented x4;  grossly normal neurologically.    Impression & Recommendations:  Problem # 1:  ABDOMINAL PAIN, CHRONIC (ICD-789.00) Chronic low-grade small bowel obstruction, status post recent hospitalization.  Patient is doing well, weight gain 3 lbs.. She is Hemoccult-negative today. She is to continue on a low-residue diet with small frequent feedings. She needs renewal of her PICC line orders. We will repeat her CBC today as well as iron studies.  Problem # 2:  UNSPECIFIED BACTERIAL PNEUMONIA (ICD-482.9) resolved  Problem # 3:  ANEMIA-IRON DEFICIENCY (ICD-280.9) will recheck Iron studies Orders: TLB-CBC Platelet - w/Differential (85025-CBCD) TLB-IBC Pnl (Iron/FE;Transferrin) (83550-IBC)    Patient Instructions: 1)  renewal of PICC line orders D5 half-normal saline at 75 cc an hour p.r.n. dehydration 2)  Aspiration precautions 3)  obtain glucose tablets p.r.n. hypoglycemia 4)  Office visit three months 5)  CBC today, iron studies 6)  Copy Sent To:Dr Laury Axon   ]

## 2010-04-18 NOTE — Progress Notes (Signed)
Summary: IRON INFUSION/LABS SCHEDULED   Phone Note Outgoing Call   Call placed by: Laureen Ochs LPN,  Jul 22, 624 12:10 PM Call placed to: Patient Summary of Call: Follow-up from labs done 07-21-09. Pt. is scheduled for an Iron Infusion at [facility] on 08-04-09 at 9am. She will have labs rechecked on 09-05-09. Appt. information given to pt. by phone and mailed to her. Pt. instructed to call back as needed.  Initial call taken by: Laureen Ochs LPN,  Jul 23, 9483 12:11 PM

## 2010-04-18 NOTE — Progress Notes (Signed)
Summary: fentenyl - dr Blossom Hoops  Phone Note Refill Request Call back at [phone]   Refills Requested: Medication #1:  enDOCET 10-325 MG  TABS Take one t every 6-8 hours as needed for pain.   Last Refilled: [id number]  Medication #2:  FENTANYL 50 MCG/HR  PT72 change patch every three days   Last Refilled: [id number] call [name] when ready for pick up  Initial call taken by: Okey Regal Spring,  March 10, 2007 8:51 AM  Follow-up for Phone Call        Left message for [name]. to p/u rx's at her convenience ...................................................................Ardyth Man  March 10, 2007 1:01 PM  Follow-up by: Ardyth Man,  March 10, 2007 1:01 PM      Prescriptions: FENTANYL 50 MCG/HR  PT72 (FENTANYL) change patch every three days  #10 x 0   Entered by:   Ardyth Man   Authorized by:   Leanne Chang MD   Signed by:   Ardyth Man on 03/10/2007   Method used:   Print then Give to Patient   RxID:   [id number] ENDOCET 10-325 MG  TABS (OXYCODONE-ACETAMINOPHEN) Take one tablet every 6-8 hours as needed for pain.  #120 x 0   Entered by:   Ardyth Man   Authorized by:   Leanne Chang MD   Signed by:   Ardyth Man on 03/10/2007   Method used:   Print then Give to Patient   RxID:   [id number]

## 2010-04-18 NOTE — Progress Notes (Signed)
Summary: [facility]--script fentanyl,endocet  Phone Note Call from Patient Call back at Home Phone [phone]   Caller: Patient Reason for Call: Refill Medication Summary of Call: Patient is calling for her scrip on her endocet 10.325 and fentanyl 50mg  Initial call taken by: Freddy Jaksch,  December 29, 2007 9:06 AM  Follow-up for Phone Call        ok to refill  both x1 Follow-up by: Loreen Freud DO,  December 29, 2007 10:21 AM  Additional Follow-up for Phone Call Additional follow up Details #1::        SPOKE WITH PT INFORMED RX READY FOR PICKUP .Kandice Hams  December 29, 2007 11:40 AM   Additional Follow-up by: Kandice Hams,  December 29, 2007 11:40 AM      Prescriptions: FENTANYL 50 MCG/HR  PT72 (FENTANYL) change patch every three days  #10 x 0   Entered and Authorized by:   Loreen Freud DO   Signed by:   Loreen Freud DO on 12/29/2007   Method used:   Print then Give to Patient   RxID:   [id number] ENDOCET 10-325 MG  TABS (OXYCODONE-ACETAMINOPHEN) Take one tablet every 6-8 hours as needed for pain.  #120 x 0   Entered and Authorized by:   Loreen Freud DO   Signed by:   Loreen Freud DO on 12/29/2007   Method used:   Print then Give to Patient   RxID:   [id number]

## 2010-04-18 NOTE — Progress Notes (Signed)
Summary: [facility]--refill  Temazepam  Phone Note Refill Request   Refills Requested: Medication #1:  CARDURA 4 MG  TABS Take one tablet daily  Medication #2:  TEMAZEPAM 15 MG  CAPS Take one to two capsules at bedtime [facility]--ph--[phone]  fax--[phone]  Initial call taken by: Freddy Jaksch,  January 08, 2008 11:10 AM  Follow-up for Phone Call        last refill Temazepam #60 3 refills 07/17/07 last ov 12/08/07 Follow-up by: Kandice Hams,  January 08, 2008 2:57 PM  Additional Follow-up for Phone Call Additional follow up Details #1::        ok to refillx1 Additional Follow-up by: Loreen Freud DO,  January 08, 2008 3:12 PM      Prescriptions: CARDURA 4 MG  TABS (DOXAZOSIN MESYLATE) Take one tablet daily  #30 x 2   Entered by:   Kandice Hams   Authorized by:   Loreen Freud DO   Signed by:   Kandice Hams on 01/08/2008   Method used:   Printed then faxed to ...       [facility] (retail)       [address]       [place], [place]  [address]       Ph: [phone]       Fax: [phone]   RxID:   [id number] TEMAZEPAM 15 MG  CAPS (TEMAZEPAM) Take one to two capsules at bedtime  #60 x 0   Entered by:   Kandice Hams   Authorized by:   Loreen Freud DO   Signed by:   Kandice Hams on 01/08/2008   Method used:   Printed then faxed to ...       [facility] (retail)       [address]       [place], [place]  [address]       Ph: [phone]       Fax: [phone]   RxID:   [id number]

## 2010-04-18 NOTE — Assessment & Plan Note (Signed)
Summary: congestion//fld   Vital Signs:  Patient Profile:   65 Years Old Female Height:     62 inches Weight:      97.4 pounds O2 Sat:      99 % Temp:     98.1 degrees F oral Pulse rate:   103 / minute BP sitting:   90 / 60  Pt. in pain?   no  Vitals Entered By: Jeremy Johann CMA (April 22, 2008 10:07 AM)                  PCP:  Loreen Freud, M.D.  Chief Complaint:  cough, chest congestion, ? pneumonia, and URI symptoms.  History of Present Illness:  URI Symptoms      This is a 65 year old woman who presents with URI symptoms.  The symptoms began duration > 3 days ago.  Pt here c/o cough and congestion for 2-3 weeks.  The patient reports productive cough, but denies nasal congestion, clear nasal discharge, purulent nasal discharge, sore throat, dry cough, earache, and sick contacts.  Associated symptoms include wheezing.  The patient denies fever, low-grade fever (<100.5 degrees), fever of 100.5-103 degrees, fever of 103.1-104 degrees, fever to >104 degrees, stiff neck, dyspnea, rash, vomiting, diarrhea, use of an antipyretic, and response to antipyretic.  The patient denies itchy watery eyes, itchy throat, sneezing, seasonal symptoms, response to antihistamine, headache, muscle aches, and severe fatigue.  The patient denies the following risk factors for Strep sinusitis: unilateral facial pain, unilateral nasal discharge, poor response to decongestant, double sickening, tooth pain, Strep exposure, tender adenopathy, and absence of cough.      Current Allergies (reviewed today): ! TALWIN ! SULFA     Review of Systems      See HPI   Physical Exam  General:     Well-developed,well-nourished,in no acute distress; alert,appropriate and cooperative throughout examination Eyes:     No corneal or conjunctival inflammation noted. EOMI. Perrla. Funduscopic exam benign, without hemorrhages, exudates or papilledema. Vision grossly normal. Ears:     External ear exam shows  no significant lesions or deformities.  Otoscopic examination reveals clear canals, tympanic membranes are intact bilaterally without bulging, retraction, inflammation or discharge. Hearing is grossly normal bilaterally. Nose:     External nasal examination shows no deformity or inflammation. Nasal mucosa are pink and moist without lesions or exudates. Mouth:     Oral mucosa and oropharynx without lesions or exudates.  Teeth in good repair. Neck:     No deformities, masses, or tenderness noted. Lungs:     R decreased breath sounds.   L side normal Heart:     normal rate and regular rhythm.   Psych:     Cognition and judgment appear intact. Alert and cooperative with normal attention span and concentration. No apparent delusions, illusions, hallucinations    Impression & Recommendations:  Problem # 1:  ACUTE BRONCHITIS (ICD-466.0) r/o pneumonia The following medications were removed from the medication list:    Avelox 400 Mg Tabs (Moxifloxacin hcl) .[name]... 1 by mouth once daily  Her updated medication list for this problem includes:    Avelox 400 Mg Tabs (Moxifloxacin hcl) .[name]... 1 by mouth once daily  Orders: T-2 View CXR (71020TC)  Take antibiotics and other medications as directed. Encouraged to push clear liquids, get enough rest, and take acetaminophen as needed. To be seen in 5-7 days if no improvement, sooner if worse.   Complete Medication List: 1)  Endocet 10-325 Mg  Tabs (Oxycodone-acetaminophen) .... Take one tablet every 6-8 hours as needed for pain. 2)  Fentanyl 50 Mcg/hr Pt72 (Fentanyl) .... Change patch every three days 3)  Kapidex 60 Mg Cpdr (Dexlansoprazole) .[name].. 1 by mouth qd 4)  Cardura 4 Mg Tabs (Doxazosin mesylate) .... Take one tablet daily 5)  Enablex 7.5 Mg Tb24 (Darifenacin hydrobromide) .... Take daily as directed 6)  Amitriptyline Hcl 25 Mg Tabs (Amitriptyline hcl) .... Take 2 tablets every evening at bedtime 7)  Celebrex 200 Mg Caps (Celecoxib) .... Take  one tablet twice daily 8)  Temazepam 15 Mg Caps (Temazepam) .... Take one to two capsules at bedtime 9)  Cyclobenzaprine Hcl 10 Mg Tabs (Cyclobenzaprine hcl) .... Take one tablet three times daily 10)  Gabapentin 600 Mg Tabs (Gabapentin) .... Take one tablet 4 times daily 11)  Norvasc 10 Mg Tabs (Amlodipine besylate) .[name].. 1 by mouth qd 12)  Promethazine Hcl 25 Mg Tabs (Promethazine hcl) .... Take one tablet  q 4-6 hrs as needed for nausea 13)  Fosamax 70 Mg Tabs (Alendronate sodium) .... Take as directed weekly 14)  Flecainide Acetate 50 Mg Tabs (Flecainide acetate) .[name].. 1 tablet by mouth every morning and 1/2 tablet po every night 15)  Vitamin D 16109 Unit Caps (Ergocalciferol) .... Take one tablet weekly 16)  Avelox 400 Mg Tabs (Moxifloxacin hcl) .[name].. 1 by mouth once daily    Prescriptions: AVELOX 400 MG TABS (MOXIFLOXACIN HCL) 1 by mouth once daily  #10 x 0   Entered and Authorized by:   Loreen Freud DO   Signed by:   Loreen Freud DO on 04/22/2008   Method used:   Electronically to        [facility] (retail)       [address]       [place], [place]  [address]       Ph: [phone]       Fax: [phone]   RxID:   [id number]

## 2010-04-18 NOTE — Letter (Signed)
Summary: Results Follow-up Letter  [place] [facility]  [address] [place], [place] [address]   Phone: [phone]  Fax: [phone]    02/26/2008        Huda Petrey [address] [place], [place]  [address]  Dear Ms. Holstein,   The following are the results of your recent test(s):  Test     Result     Pap Smear    Normal_______  Not Normal_____       Comments: _________________________________________________________ Cholesterol LDL(Bad cholesterol):          Your goal is less than:         HDL (Good cholesterol):        Your goal is more than: _________________________________________________________ Other Tests:   _________________________________________________________  Please call for an appointment Or Please see attached labs._________________________________________________________ _________________________________________________________ _________________________________________________________  Sincerely,  Felecia Deloach CMA [place] [facility]

## 2010-04-18 NOTE — Letter (Signed)
Summary: [facility]   Imported By: Sherian Rein 02/07/2010 07:40:39  _____________________________________________________________________  External Attachment:    Type:   Image     Comment:   External Document

## 2010-04-18 NOTE — Letter (Signed)
Summary: [facility]   Imported By: Lanelle Bal 07/01/2009 09:12:29  _____________________________________________________________________  External Attachment:    Type:   Image     Comment:   External Document

## 2010-04-18 NOTE — Progress Notes (Signed)
Summary: refill - dr Kandice Moos  Phone Note Call from Patient Call back at Home Phone [phone]   Caller: Patient Summary of Call: patient needs rx endocet 10mg  - 4 a day ----- fentanyl 50 mcg - 2 boxesl Initial call taken by: Okey Regal [place],  June 01, 2008 9:04 AM  Follow-up for Phone Call        LAST OV 04/29/08, LAST REFILL ENDOCET 04/29/08 #!120 X 0, LAST REFILL fENTANYL #10 X 0 Follow-up by: Kandice Hams,  June 01, 2008 11:41 AM  Additional Follow-up for Phone Call Additional follow up Details #1::        ok to refill x1 each Additional Follow-up by: Loreen Freud DO,  June 01, 2008 11:58 AM    Additional Follow-up for Phone Call Additional follow up Details #2::    pt aware she will pickup on thur 06/02/08 .Kandice Hams  June 01, 2008 3:48 PM  Follow-up by: Kandice Hams,  June 01, 2008 3:48 PM    Prescriptions: FENTANYL 50 MCG/HR  PT72 (FENTANYL) change patch every three days  #10 x 0   Entered by:   Kandice Hams   Authorized by:   Loreen Freud DO   Signed by:   Kandice Hams on 06/01/2008   Method used:   Printed then faxed to ...       [facility] (retail)       [address]       [place], [place]  [id number]       Ph: [phone]       Fax: [phone]   RxID:   [id number] ENDOCET 10-325 MG  TABS (OXYCODONE-ACETAMINOPHEN) Take one tablet every 6-8 hours as needed for pain.  #120 x 0   Entered by:   Kandice Hams   Authorized by:   Loreen Freud DO   Signed by:   Kandice Hams on 06/01/2008   Method used:   Printed then faxed to ...       [facility] (retail)       [address]       [place], [place]  [address]       Ph: [phone]       Fax: [phone]   RxID:   [id number]

## 2010-04-18 NOTE — Progress Notes (Signed)
Summary: GI REFERRAL REQUEST  Phone Note Call from Patient Call back at Home Phone [phone]   Caller: Patient Call For: Becky Freud DO Reason for Call: Referral Summary of Call: PATIENT WAS HERE FOR OV YESTERDAY, 02-15-09.  SHE IS CALLING IN REFERENCE TO THE STATUS OF A GI REFERRAL.  WILL YOU BE REFERRING PATIENT TO GI? Initial call taken by: Magdalen Spatz [facility],  February 16, 2009 9:21 AM  Follow-up for Phone Call        Duwayne Heck is waiting for call back---she did call them and was waiting for Dr Juanda Chance to respone. Follow-up by: Becky Freud DO,  February 16, 2009 12:33 PM  Additional Follow-up for Phone Call Additional follow up Details #1::        Left pt a detailed message that her appt was tomorrow am 02-17-09 at 9am. She needed to take her insurance card, med list and informed her of the no show/cx policy.  Additional Follow-up by: Army Fossa CMA,  February 16, 2009 2:04 PM    Additional Follow-up for Phone Call Additional follow up Details #2::    I S/W PT [facility] IS AWARE OF APPT W/AMY Jeannine Kitten [facility]  February 16, 2009 4:26 PM

## 2010-04-18 NOTE — Miscellaneous (Signed)
Summary: omeperazole refill  Clinical Lists Changes  Medications: Rx of OMEPRAZOLE 20 MG  CPDR (OMEPRAZOLE) Take one tablet daily as directed;  #30 x 4;  Signed;  Entered by: Hortense Ramal CMA;  Authorized by: Hart Carwin MD;  Method used: Electronically to [facility] [address], [place], [place]  [id number], Ph: [phone], Fax: [phone]    Prescriptions: OMEPRAZOLE 20 MG  CPDR (OMEPRAZOLE) Take one tablet daily as directed  #30 x 4   Entered by:   Hortense Ramal CMA   Authorized by:   Hart Carwin MD   Signed by:   Hortense Ramal CMA on 12/19/2007   Method used:   Electronically to        [facility] (retail)       [address]       [place], [place]  [address]       Ph: [phone]       Fax: [phone]   RxID:   [id number]

## 2010-04-18 NOTE — Assessment & Plan Note (Signed)
Summary: follow up/ mbw   Copy to:    Primary Provider/Referring Provider:  Loreen Ross, M.D.  CC:  1 month follow-up visit.  Pt states overall breathing OK and gets SOB with exertion at times.  Pt states she can tell improvement with addition of spiriva inhaler.  .  History of Present Illness: I met [name] for evaluation of her recurrent pneumonia, and COPD.  She has been feeling better since starting spiriva.  She feels the best after she initially uses it, but then feels the effects wane as the day goes on.  She has not needed to use her ventolin that much.  She has developed a cough since yesterday.  She is not sure if she has bringing up sputum.  She denies hemoptysis, chest pain, wheeze, or fever.  She has not had sinus congestion, or sore throat.  Current Medications (verified): 1)  Endocet 10-325 Mg  Tabs (Oxycodone-Acetaminophen) .... Take One Tablet Every 6-8 Hours As Needed For Pain. 2)  Fentanyl 50 Mcg/hr  Pt72 (Fentanyl) .... Change Patch Every Three Days 3)  Prilosec 40 Mg Cpdr (Omeprazole) .... Take 1 Tablet By Mouth Once A Day 4)  Cardura 4 Mg  Tabs (Doxazosin Mesylate) .... Take One Tablet Daily 5)  Enablex 7.5 Mg  Tb24 (Darifenacin Hydrobromide) .... Take Daily As Directed 6)  Amitriptyline Hcl 25 Mg  Tabs (Amitriptyline Hcl) .... Take 2 Tablets Every Evening At Bedtime As Needed 7)  Mobic 15 Mg Tabs (Meloxicam) .... Take 1/2 - 1 By Mouth Once Daily 8)  Temazepam 15 Mg  Caps (Temazepam) .... Take One To Two Capsules At Bedtime 9)  Cyclobenzaprine Hcl 10 Mg  Tabs (Cyclobenzaprine Hcl) .... Take One Tablet Three Times Daily 10)  Gabapentin 600 Mg  Tabs (Gabapentin) .... Take One Tablet 4 Times Daily 11)  Norvasc 10 Mg  Tabs (Amlodipine Besylate) .[name].. 1 By Mouth Qd 12)  Promethazine Hcl 25 Mg  Tabs (Promethazine Hcl) .... Take One Tablet  Q 4-6 Hrs As Needed For Nausea 13)  Flecainide Acetate 50 Mg  Tabs (Flecainide Acetate) .[name].. 1 Tablet By Mouth Every Morning and 1/2  Tablet Po Every Night 14)  Vitamin D 16109 Unit Caps (Ergocalciferol) .... Take One Tablet Weekly 15)  Spiriva Handihaler 18 Mcg Caps (Tiotropium Bromide Monohydrate) .... One Puff Once Daily 16)  Ventolin Hfa 108 (90 Base) Mcg/act Aers (Albuterol Sulfate) .... Two Puffs Up To Four Times Per Day As Needed  Allergies (verified): 1)  ! Talwin 2)  ! Sulfa  Past History:  Past Medical History:    Anemia-iron deficiency    Hypertension    Osteoarthritis    GERD    CAD    Left bundle branch block    Atrial fibrillation - Dr. Roger Shelter    Chronic pain    Fibromyalgia    Diverticulosis, colon    COPD/Emphysema         -06/28/08 PFT FEV1 1.95 (97%), FVC 2.79 (101%), FEV1% 70, TLC 3.88 (87%), DLCO 64%    Pneumonia         - 03/09, 12/09, 02/10    Exudative pleural effusion s/p right thoracentesis 03/09  Past Surgical History:    Reviewed history from 05/12/2008 and no changes required:    Intestinal resection (5times)    Hysterectomy-complete    Appendectomy    Bladder lift    Tonsillectomy    Lysis of intestinal adhesions-numerous surgeries    PICC Line    TAH  Vital Signs:  Patient profile:   65 year old female Weight:      95.13 pounds O2 Sat:      94 % Temp:     98.0 degrees F oral Pulse rate:   89 / minute BP sitting:   120 / 74  (right arm) Cuff size:   small  Vitals Entered By: Cloyde Reams RN (Jul 29, 2008 11:10 AM)  O2 Sat at Rest %:  94 O2 Flow:  room air  Physical Exam  General:  thin.   Nose:  no deformity, discharge, inflammation, or lesions Mouth:  no deformity or lesions Neck:  no JVD.   Lungs:  no wheezing or rales Heart:  regular rhythm, normal rate, and no murmurs.   Abdomen:  thin, soft, no masses, non-tender, normal bowel sounds Extremities:  no edema, PICC line in left arm Cervical Nodes:  no significant adenopathy   Impression & Recommendations:  Problem # 1:  COPD (ICD-496) She has done well with spiriva.  Will also continue as  needed ventolin.  Will monitor her cough.  I don't think she needs a chest xray or antibiotics at this time.  Problem # 2:  PNEUMONIA (ICD-486) This does not appear to be an active issue at present, but will monitor her cough.  Medications Added to Medication List This Visit: 1)  Amitriptyline Hcl 25 Mg Tabs (Amitriptyline hcl) .... Take 2 tablets every evening at bedtime as needed  Complete Medication List: 1)  Endocet 10-325 Mg Tabs (Oxycodone-acetaminophen) .... Take one tablet every 6-8 hours as needed for pain. 2)  Fentanyl 50 Mcg/hr Pt72 (Fentanyl) .... Change patch every three days 3)  Prilosec 40 Mg Cpdr (Omeprazole) .... Take 1 tablet by mouth once a day 4)  Cardura 4 Mg Tabs (Doxazosin mesylate) .... Take one tablet daily 5)  Enablex 7.5 Mg Tb24 (Darifenacin hydrobromide) .... Take daily as directed 6)  Amitriptyline Hcl 25 Mg Tabs (Amitriptyline hcl) .... Take 2 tablets every evening at bedtime as needed 7)  Mobic 15 Mg Tabs (Meloxicam) .... Take 1/2 - 1 by mouth once daily 8)  Temazepam 15 Mg Caps (Temazepam) .... Take one to two capsules at bedtime 9)  Cyclobenzaprine Hcl 10 Mg Tabs (Cyclobenzaprine hcl) .... Take one tablet three times daily 10)  Gabapentin 600 Mg Tabs (Gabapentin) .... Take one tablet 4 times daily 11)  Norvasc 10 Mg Tabs (Amlodipine besylate) .[name].. 1 by mouth qd 12)  Promethazine Hcl 25 Mg Tabs (Promethazine hcl) .... Take one tablet  q 4-6 hrs as needed for nausea 13)  Flecainide Acetate 50 Mg Tabs (Flecainide acetate) .[name].. 1 tablet by mouth every morning and 1/2 tablet po every night 14)  Vitamin D 08657 Unit Caps (Ergocalciferol) .... Take one tablet weekly 15)  Spiriva Handihaler 18 Mcg Caps (Tiotropium bromide monohydrate) .... One puff once daily 16)  Ventolin Hfa 108 (90 Base) Mcg/act Aers (Albuterol sulfate) .... Two puffs up to four times per day as needed  Other Orders: Est. Patient Level II ([address])  Patient Instructions: 1)  Follow up in 4 to 6  months

## 2010-04-18 NOTE — Assessment & Plan Note (Signed)
Summary: 70months//tl   Vital Signs:  Patient Profile:   65 Years Old Female Weight:      93 pounds Temp:     98.0 degrees F oral Pulse rate:   60 / minute Resp:     16 per minute BP sitting:   98 / 68  (right arm)  Pt. in pain?   no  Vitals Entered By: Ardyth Man (March 03, 2007 11:17 AM)                  Chief Complaint:  3 month follow up.  History of Present Illness: Patient doing well since our last visit. Currentl wearing a holter monitor per cardiology. Has not had a recurrence of syncope.  Continue to wear hear arm sling for left arm fracture. Regarding her chronic pain management, she is doing well. Matter of fact, has not needed additional medication adjustments with recent fracture. Denies  side effects from medication and except mild dry mouth. Appetite unchanged. No diarrhea or constipation.  Regarding her blood pressure, she is taking her medications daily. No side effects. No complaint of chest pain, SOB, edema   Current Allergies: ! TALWIN ! SULFA      Physical Exam  General:     alert and pleasant Neck:     No deformities, masses, or tenderness noted. Lungs:     Normal respiratory effort, chest expands symmetrically. Lungs are clear to auscultation, no crackles or wheezes. Heart:     Normal rate and regular rhythm. S1 and S2 normal without gallop, murmur, click, rub or other extra sounds. Pulses:     R and L carotid,radial,femoral,dorsalis pedis and posterior tibial pulses are full and equal bilaterally Extremities:     No clubbing, cyanosis, edema, or deformity noted with normal full range of motion of all joints.      Impression & Recommendations:  Problem # 1:  HYPERTENSION, BENIGN ESSENTIAL (ICD-401.1) Stable Her updated medication list for this problem includes:    Cardura 4 Mg Tabs (Doxazosin mesylate) .[name]... Take one tablet daily    Verelan Pm 100 Mg Cp24 (Verapamil hcl) .[name]... Take one tablet daily    Norvasc 10 Mg Tabs  (Amlodipine besylate) .[name]... 1 by mouth qd  BP today: 98/68 Prior BP: 104/64 (02/17/2007)  Labs Reviewed: Creat: 0.8 (09/16/2006) Chol: 139 (03/11/2006)   HDL: 38.5 (03/11/2006)   LDL: 85 (03/11/2006)   TG: 76 (03/11/2006)   Problem # 2:  SYNDROME, CHRONIC PAIN (ICD-338.4) Stable. no change in medication F/u in 3 months and prn  Problem # 3:  SYNCOPE (ICD-780.2) Followed by Cardiology.  Problem # 4:  FRACTURE, HUMERUS, PROXIMAL (ICD-812.00) Followed by Ortho  Complete Medication List: 1)  Endocet 10-325 Mg Tabs (Oxycodone-acetaminophen) .... Take one tablet every 6-8 hours as needed for pain. 2)  Fentanyl 50 Mcg/hr Pt72 (Fentanyl) .... Change patch every three days 3)  Omeprazole 20 Mg Cpdr (Omeprazole) .... Take one tablet daily as directed 4)  Cardura 4 Mg Tabs (Doxazosin mesylate) .... Take one tablet daily 5)  Enablex 7.5 Mg Tb24 (Darifenacin hydrobromide) .... Take daily as directed 6)  Amitriptyline Hcl 25 Mg Tabs (Amitriptyline hcl) .... Take 2 tablets every evening at bedtime 7)  Celebrex 200 Mg Caps (Celecoxib) .... Take once daily 8)  Temazepam 15 Mg Caps (Temazepam) .... Take one to two capsules at bedtime 9)  Cyclobenzaprine Hcl 10 Mg Tabs (Cyclobenzaprine hcl) .... Take one tablet three times daily 10)  Clonazepam 0.5 Mg Tabs (Clonazepam) .Marland KitchenMarland KitchenMarland Kitchen  Take 2 tablets every evening at bedtime. 11)  Gabapentin 600 Mg Tabs (Gabapentin) .... Take one tablet daily 12)  Verelan Pm 100 Mg Cp24 (Verapamil hcl) .... Take one tablet daily 13)  Mirapex 0.125 Mg Tabs (Pramipexole dihydrochloride) .[name].. 1 by mouth 2 hrs before bed time 14)  Norvasc 10 Mg Tabs (Amlodipine besylate) .[name].. 1 by mouth qd 15)  Promethazine Hcl 25 Mg Tabs (Promethazine hcl) .... Take one tablet 14-6 hrs as needed for nausea 16)  Fosamax Plus D 70-5600 Mg-unit Tabs (Alendronate-cholecalciferol) .Marland KitchenMarland KitchenMarland Kitchen 1 weekly 17)  Fosamax 70 Mg Tabs (Alendronate sodium) .... Take as directed weekly     ]

## 2010-04-18 NOTE — Assessment & Plan Note (Signed)
Summary: ROV//MBW   Copy to:  Dr. Laury Axon Primary Provider/Referring Provider:  Loreen Freud DO  CC:  1 wk f/u.  Pt c/o "dry cough."  Pt states when she breathes deeply she still has pain in right lower lobe.[name]  History of Present Illness: 65 yo female with history of COPD and recurrent pneumonia.  She has been feeling much better.  Her cough has improved.  She is still bringing up some sputum, but this is less in amount.  She still gets some discomfort with deep breathing in her right lower chest, but this is less prominent than before.  She has not been having fever or sweats.  She does get occasional wheeze, but this clears after using her inhaler.  She denies abdominal symptoms.   Current Medications (verified): 1)  Endocet 10-325 Mg  Tabs (Oxycodone-Acetaminophen) .... Take One Tablet Every 6-8 Hours As Needed For Pain. 2)  Fentanyl 50 Mcg/hr  Pt72 (Fentanyl) .... Change Patch Every Three Days 3)  Prilosec 40 Mg Cpdr (Omeprazole) .... Take 1 Tablet By Mouth Once A Day 4)  Cardura 4 Mg  Tabs (Doxazosin Mesylate) .... Take One Tablet Daily 5)  Amitriptyline Hcl 25 Mg  Tabs (Amitriptyline Hcl) .... Take 2 Tablets Every Evening At Bedtime As Needed 6)  Mobic 15 Mg Tabs (Meloxicam) .... Take 1/2 - 1 By Mouth Once Daily 7)  Temazepam 15 Mg  Caps (Temazepam) .... Take One To Two Capsules At Bedtime 8)  Cyclobenzaprine Hcl 10 Mg  Tabs (Cyclobenzaprine Hcl) .... Take One Tablet Three Times Daily 9)  Gabapentin 600 Mg  Tabs (Gabapentin) .... Take One Tablet 4 Times Daily 10)  Norvasc 10 Mg  Tabs (Amlodipine Besylate) .[name].. 1 By Mouth Qd 11)  Promethazine Hcl 25 Mg  Tabs (Promethazine Hcl) .... Take One Tablet  Q 4-6 Hrs As Needed For Nausea 12)  Flecainide Acetate 50 Mg  Tabs (Flecainide Acetate) .[name].. 1 Tablet By Mouth Every Morning and 1/2 Tablet Po Every Night 13)  Spiriva Handihaler 18 Mcg Caps (Tiotropium Bromide Monohydrate) .... One Puff Once Daily 14)  Ventolin Hfa 108 (90 Base) Mcg/act  Aers (Albuterol Sulfate) .... Two Puffs Up To Four Times Per Day As Needed 15)  Doxycycline Hyclate 100 Mg Caps (Doxycycline Hyclate) .... One Tab Two Times A Day For 7 Days 16)  Promethazine-Codeine 6.25-10 Mg/59ml Syrp (Promethazine-Codeine) .... 5 Ml By Mouth Every 4-6 Hours As Needed,   Allergies (verified): 1)  ! Talwin 2)  ! Sulfa  Past History:  Past Medical History: Anemia-iron deficiency Hypertension Osteoarthritis GERD CAD Left bundle branch block Atrial fibrillation - Dr. Roger Shelter Chronic pain Fibromyalgia Diverticulosis, colon COPD/Emphysema      -06/28/08 PFT FEV1 1.95 (97%), FVC 2.79 (101%), FEV1% 70, TLC 3.88 (87%), DLCO 64% Pneumonia      - 03/09, 12/09, 02/10, 06/10 Exudative pleural effusion s/p right thoracentesis 03/09  Past Surgical History: Reviewed history from 05/12/2008 and no changes required. Intestinal resection (5times) Hysterectomy-complete Appendectomy Bladder lift Tonsillectomy Lysis of intestinal adhesions-numerous surgeries PICC Line TAH  Family History: Reviewed history from 05/12/2008 and no changes required. Family History of Heart Disease: Father Family History of CVA: Mother No FH of Colon Cancer:  Social History: Reviewed history from 05/12/2008 and no changes required. Occupation: Disabled Alcohol Use - no Illicit Drug Use - no Patient is a former smoker.  Daily Caffeine Use  Vital Signs:  Patient profile:   65 year old female Height:  62 inches Weight:      91.13 pounds BMI:     16.73 O2 Sat:      96 % on Room air Temp:     97.6 degrees F oral Pulse rate:   100 / minute BP sitting:   110 / 68  (right arm) Cuff size:   regular  Vitals Entered By: Gweneth Dimitri RN (September 16, 2008 10:22 AM)  O2 Flow:  Room air  Physical Exam  General:  thin.   Nose:  no deformity, discharge, inflammation, or lesions Mouth:  no deformity or lesions Neck:  no JVD.   Lungs:  decreased breath sounds, no  wheezing or rales, faint dullness at right base Heart:  regular rhythm, normal rate, and no murmurs.   Abdomen:  thin, soft, no masses, non-tender, normal bowel sounds Extremities:  no edema, PICC line in left arm Cervical Nodes:  no significant adenopathy   Impression & Recommendations:  Problem # 1:  PNEUMONIA (ICD-486) She has improved clinically.  She still has some mild pleuritic pain.  I will have her get a chest xray today to document clearing of her infiltrate.  I don't think she needs additional antibiotics at this time.  She may need further immunologic evaluation.  She may also need airway inspection, but her CT chest did not suggest any obstructing lesion.  Problem # 2:  COPD (ICD-496) She is to continue her inhaler regimen.  Problem # 3:  PLEURAL EFFUSION, RIGHT (ICD-511.9) Will follow up chest xray, but clinically seems improved.  Complete Medication List: 1)  Spiriva Handihaler 18 Mcg Caps (Tiotropium bromide monohydrate) .... One puff once daily 2)  Ventolin Hfa 108 (90 Base) Mcg/act Aers (Albuterol sulfate) .... Two puffs up to four times per day as needed 3)  Endocet 10-325 Mg Tabs (Oxycodone-acetaminophen) .... Take one tablet every 6-8 hours as needed for pain. 4)  Fentanyl 50 Mcg/hr Pt72 (Fentanyl) .... Change patch every three days 5)  Mobic 15 Mg Tabs (Meloxicam) .... Take 1/2 - 1 by mouth once daily 6)  Prilosec 40 Mg Cpdr (Omeprazole) .... Take 1 tablet by mouth once a day 7)  Cardura 4 Mg Tabs (Doxazosin mesylate) .... Take one tablet daily 8)  Norvasc 10 Mg Tabs (Amlodipine besylate) .[name].. 1 by mouth qd 9)  Flecainide Acetate 50 Mg Tabs (Flecainide acetate) .[name].. 1 tablet by mouth every morning and 1/2 tablet po every night 10)  Amitriptyline Hcl 25 Mg Tabs (Amitriptyline hcl) .... Take 2 tablets every evening at bedtime as needed 11)  Temazepam 15 Mg Caps (Temazepam) .... Take one to two capsules at bedtime 12)  Cyclobenzaprine Hcl 10 Mg Tabs (Cyclobenzaprine  hcl) .... Take one tablet three times daily 13)  Gabapentin 600 Mg Tabs (Gabapentin) .... Take one tablet 4 times daily 14)  Promethazine Hcl 25 Mg Tabs (Promethazine hcl) .... Take one tablet  q 4-6 hrs as needed for nausea 15)  Promethazine-codeine 6.25-10 Mg/59ml Syrp (Promethazine-codeine) .... 5 ml by mouth every 4-6 hours as needed,   Other Orders: T-2 View CXR, Same Day (71020.5TC) Est. Patient Level III (52841)  Patient Instructions: 1)  Chest xray today 2)  Follow up in 3 to 4 months

## 2010-04-18 NOTE — Assessment & Plan Note (Signed)
Summary: 2-3wk rov   Copy to:  Dr. Laury Axon Primary Provider/Referring Provider:  Loreen Freud, M.D.  CC:  2-3 WEEK FOLLOW-UP WITH pft..  History of Present Illness: I met [name] for evaluation of her recurrent pneumonia, and to review her CT chest results.   Still is having trouble swallowing.  She has not follow up with primary or GI for this.    She has an occasional cough which is usually dry.  She does occasionally produce some sputum which is clear.  She denies fever, sweats, or hemoptysis.  She is not having problems with allergies or her sinuses.    She has been using her ventolin, and this helps.  Her pulmonary function tests do show airflow obstruction.   Current Medications (verified): 1)  Endocet 10-325 Mg  Tabs (Oxycodone-Acetaminophen) .... Take One Tablet Every 6-8 Hours As Needed For Pain. 2)  Fentanyl 50 Mcg/hr  Pt72 (Fentanyl) .... Change Patch Every Three Days 3)  Prilosec 40 Mg Cpdr (Omeprazole) .... Take 1 Tablet By Mouth Once A Day 4)  Cardura 4 Mg  Tabs (Doxazosin Mesylate) .... Take One Tablet Daily 5)  Enablex 7.5 Mg  Tb24 (Darifenacin Hydrobromide) .... Take Daily As Directed 6)  Amitriptyline Hcl 25 Mg  Tabs (Amitriptyline Hcl) .... Take 2 Tablets Every Evening At Bedtime 7)  Celebrex 200 Mg  Caps (Celecoxib) .... Take One Tablet Twice Daily 8)  Temazepam 15 Mg  Caps (Temazepam) .... Take One To Two Capsules At Bedtime 9)  Cyclobenzaprine Hcl 10 Mg  Tabs (Cyclobenzaprine Hcl) .... Take One Tablet Three Times Daily 10)  Gabapentin 600 Mg  Tabs (Gabapentin) .... Take One Tablet 4 Times Daily 11)  Norvasc 10 Mg  Tabs (Amlodipine Besylate) .[name].. 1 By Mouth Qd 12)  Promethazine Hcl 25 Mg  Tabs (Promethazine Hcl) .... Take One Tablet  Q 4-6 Hrs As Needed For Nausea 13)  Flecainide Acetate 50 Mg  Tabs (Flecainide Acetate) .[name].. 1 Tablet By Mouth Every Morning and 1/2 Tablet Po Every Night 14)  Vitamin D 78295 Unit Caps (Ergocalciferol) .... Take One Tablet  Weekly  Allergies (verified): 1)  ! Talwin 2)  ! Sulfa  Past History:  Past Medical History:    Reviewed history from 06/10/2008 and no changes required:    Anemia-iron deficiency    Hypertension    Osteoarthritis    GERD    CAD    Left bundle branch block    Atrial fibrillation - Dr. Roger Shelter    Chronic pain    Fibromyalgia    Diverticulosis, colon    Pneumonia         - 03/09, 12/09, 02/10    Exudative pleural effusion s/p right thoracentesis 03/09  Past Surgical History:    Reviewed history from 05/12/2008 and no changes required:    Intestinal resection (5times)    Hysterectomy-complete    Appendectomy    Bladder lift    Tonsillectomy    Lysis of intestinal adhesions-numerous surgeries    PICC Line    TAH  Vital Signs:  Patient profile:   65 year old female Height:      62 inches (157.48 cm) Weight:      94 pounds (42.73 kg) BMI:     17.25 O2 Sat:      97 % Temp:     97.6 degrees F (36.44 degrees C) oral Pulse rate:   94 / minute BP sitting:   110 / 70  (right arm) Cuff  size:   regular  Vitals Entered By: Michel Bickers CMA (June 28, 2008 1:27 PM)  O2 Sat at Rest %:  97 O2 Flow:  room air  Physical Exam  General:  thin.   Nose:  no deformity, discharge, inflammation, or lesions Mouth:  no deformity or lesions Neck:  no JVD.   Lungs:  no wheezing or rales Heart:  regular rhythm, normal rate, and no murmurs.   Abdomen:  thin, soft, no masses, non-tender, normal bowel sounds Extremities:  No clubbing, cyanosis, edema, or deformity noted with normal full range of motion of all joints.   Cervical Nodes:  no significant adenopathy   Impression & Recommendations:  Problem # 1:  COPD (ICD-496) She has airflow obstruction on her pulmonary function test. She is a former smoker.   I will add spiriva and continue ventolin as needed.  Problem # 2:  PNEUMONIA (ICD-486) She appears to have recovered fully from her last episode of pneumonia.  She still  c/o difficulty swallowing.  She may need further evaluation of her swallowing, but I will defer this decision to her primary care and GI.  I will check her labs today to see if she may have an additional cause for her recurrent episodes of pneumonia.  Medications Added to Medication List This Visit: 1)  Spiriva Handihaler 18 Mcg Caps (Tiotropium bromide monohydrate) .... One puff once daily 2)  Ventolin Hfa 108 (90 Base) Mcg/act Aers (Albuterol sulfate) .... Two puffs up to four times per day as needed  Complete Medication List: 1)  Endocet 10-325 Mg Tabs (Oxycodone-acetaminophen) .... Take one tablet every 6-8 hours as needed for pain. 2)  Fentanyl 50 Mcg/hr Pt72 (Fentanyl) .... Change patch every three days 3)  Prilosec 40 Mg Cpdr (Omeprazole) .... Take 1 tablet by mouth once a day 4)  Cardura 4 Mg Tabs (Doxazosin mesylate) .... Take one tablet daily 5)  Enablex 7.5 Mg Tb24 (Darifenacin hydrobromide) .... Take daily as directed 6)  Amitriptyline Hcl 25 Mg Tabs (Amitriptyline hcl) .... Take 2 tablets every evening at bedtime 7)  Celebrex 200 Mg Caps (Celecoxib) .... Take one tablet twice daily 8)  Temazepam 15 Mg Caps (Temazepam) .... Take one to two capsules at bedtime 9)  Cyclobenzaprine Hcl 10 Mg Tabs (Cyclobenzaprine hcl) .... Take one tablet three times daily 10)  Gabapentin 600 Mg Tabs (Gabapentin) .... Take one tablet 4 times daily 11)  Norvasc 10 Mg Tabs (Amlodipine besylate) .[name].. 1 by mouth qd 12)  Promethazine Hcl 25 Mg Tabs (Promethazine hcl) .... Take one tablet  q 4-6 hrs as needed for nausea 13)  Flecainide Acetate 50 Mg Tabs (Flecainide acetate) .[name].. 1 tablet by mouth every morning and 1/2 tablet po every night 14)  Vitamin D 16109 Unit Caps (Ergocalciferol) .... Take one tablet weekly 15)  Spiriva Handihaler 18 Mcg Caps (Tiotropium bromide monohydrate) .... One puff once daily 16)  Ventolin Hfa 108 (90 Base) Mcg/act Aers (Albuterol sulfate) .... Two puffs up to four times per  day as needed  Other Orders: Est. Patient Level II (60454) T-Antinuclear Antib (ANA) [phone]) T-Anti-Parietal Cell Antibody ([id number]) T-Anti SMA ([id number]) T-AMA (28413-24401) Prescription Created Electronically [phone]) TLB-Hepatic/Liver Function Pnl (80076-HEPATIC) TLB-CBC Platelet - w/Differential (85025-CBCD) TLB-BMP (Basic Metabolic Panel-BMET) (80048-METABOL) TLB-Rheumatoid Factor (RA) (36644-IH) TLB-Sedimentation Rate (ESR) (85652-ESR)  Patient Instructions: 1)  Blood work today 2)  Spiriva one puff once daily 3)  Ventolin two puffs up to four times per day as needed  4)  Follow  up in 4 to 6 weeks Prescriptions: SPIRIVA HANDIHALER 18 MCG CAPS (TIOTROPIUM BROMIDE MONOHYDRATE) one puff once daily  #30 x 3   Entered and Authorized by:   Coralyn Helling MD   Signed by:   Coralyn Helling MD on 06/28/2008   Method used:   Print then Give to Patient   RxID:   [id number] VENTOLIN HFA 108 (90 BASE) MCG/ACT AERS (ALBUTEROL SULFATE) two puffs up to four times per day as needed  #1 x 3   Entered and Authorized by:   Coralyn Helling MD   Signed by:   Coralyn Helling MD on 06/28/2008   Method used:   Print then Give to Patient   RxID:   [id number] VENTOLIN HFA 108 (90 BASE) MCG/ACT AERS (ALBUTEROL SULFATE) two puffs up to four times per day as needed  #1 x 3   Entered and Authorized by:   Coralyn Helling MD   Signed by:   Coralyn Helling MD on 06/28/2008   Method used:   Electronically to        [facility] (retail)       [address]       [place], [place]  [address]       Ph: [phone]       Fax: [phone]   RxID:   [id number] SPIRIVA HANDIHALER 18 MCG CAPS (TIOTROPIUM BROMIDE MONOHYDRATE) one puff once daily  #30 x 3   Entered and Authorized by:   Coralyn Helling MD   Signed by:   Coralyn Helling MD on 06/28/2008   Method used:   Electronically to        [facility] (retail)       [address]       [place], [place]   [id number]       Ph: [phone]       Fax: [phone]   RxID:   [id number]

## 2010-04-18 NOTE — Assessment & Plan Note (Signed)
Summary: 3 MONTH FOLLOWUP///SPH   Vital Signs:  Patient profile:   65 year old female Weight:      86 pounds Temp:     98.2 degrees F oral Pulse rate:   82 / minute BP sitting:   100 / 62  (left arm)  Vitals Entered By: Jeremy Johann CMA (May 31, 2009 12:34 PM) CC: 3 month f/u meds Comments REVIEWED MED LIST, PATIENT AGREED DOSE AND INSTRUCTION CORRECT    History of Present Illness: Pt here f/u pain meds and bp.  Pt just had surgery.  Pt doing well.  No complaints.    Preventive Screening-Counseling & Management  Alcohol-Tobacco     Smoking Status: quit     Year Quit: 1990  Caffeine-Diet-Exercise     Caffeine use/day: 1  Current Medications (verified): 1)  Spiriva Handihaler 18 Mcg Caps (Tiotropium Bromide Monohydrate) .... One Puff Once Daily 2)  Ventolin Hfa 108 (90 Base) Mcg/act Aers (Albuterol Sulfate) .... Two Puffs Up To Four Times Per Day As Needed 3)  Endocet 10-325 Mg  Tabs (Oxycodone-Acetaminophen) .... Take One Tablet Every 6-8 Hours As Needed For Pain. 4)  Fentanyl 50 Mcg/hr  Pt72 (Fentanyl) .... Change Patch Every Three Days 5)  Mobic 15 Mg Tabs (Meloxicam) .... Take 1/2 - 1 By Mouth Once Daily 6)  Prilosec 40 Mg Cpdr (Omeprazole) .... Take 1 Tablet By Mouth Once A Day 7)  Norvasc 5 Mg Tabs (Amlodipine Besylate) .[name].. 1 By Mouth Once Daily 8)  Flecainide Acetate 50 Mg  Tabs (Flecainide Acetate) .[name].. 1 Tablet By Mouth Every Morning and 1/2 Tablet Po Every Night 9)  Temazepam 15 Mg  Caps (Temazepam) .... Take One To Two Capsules At Bedtime 10)  Cyclobenzaprine Hcl 10 Mg  Tabs (Cyclobenzaprine Hcl) .... Take One Tablet Three Times Daily 11)  Gabapentin 600 Mg  Tabs (Gabapentin) .... Take One Tablet 4 Times Daily 12)  Promethazine-Codeine 6.25-10 Mg/55ml Syrp (Promethazine-Codeine) .... 5 Ml By Mouth Every 4-6 Hours As Needed,  13)  Zofran 8 Mg Tabs (Ondansetron Hcl) .[name].. 1 By Mouth Q12 H As Needed 14)  Foradil Aerolizer 12 Mcg Caps (Formoterol Fumarate)  .... One Puff Two Times A Day 15)  Zithromax Z-Pak 250 Mg Tabs (Azithromycin) .... As Directed  Allergies: 1)  ! Talwin 2)  ! Sulfa  Past History:  Past medical, surgical, family and social histories (including risk factors) reviewed for relevance to current acute and chronic problems.  Past Medical History: Reviewed history from 02/17/2009 and no changes required. Anemia-iron deficiency CHRONIC PARTIAL SMALL BOWEL OBSTRUCTION Hypertension Osteoarthritis GERD CAD Left bundle branch block Atrial fibrillation - Dr. Roger Shelter Chronic pain Fibromyalgia Diverticulosis, colon COPD/Emphysema      -06/28/08 PFT FEV1 1.95 (97%), FVC 2.79 (101%), FEV1% 70, TLC 3.88 (87%), DLCO 64% Pneumonia      - 03/09, 12/09, 02/10, 06/10 Exudative pleural effusion s/p right thoracentesis 03/09  Past Surgical History: Reviewed history from 02/17/2009 and no changes required. Intestinal resection (5times),S/P PARTIAL COLECTOMY 2001 Hysterectomy-complete Appendectomy Bladder lift Tonsillectomy Lysis of intestinal adhesions-numerous surgeries/LAST 2003 PICC Line TAH  Family History: Reviewed history from 05/12/2008 and no changes required. Family History of Heart Disease: Father Family History of CVA: Mother No FH of Colon Cancer:  Social History: Reviewed history from 04/07/2009 and no changes required. Occupation: Disabled Alcohol Use - no Illicit Drug Use - no Patient is a former smoker, quit 1988 x68yrs, <1ppd.  Daily Caffeine Use  Review of Systems  See HPI  Physical Exam  General:  alert, well-developed, well-hydrated, and underweight appearing.   Lungs:  Normal respiratory effort, chest expands symmetrically. Lungs are clear to auscultation, no crackles or wheezes. Heart:  normal rate.   Extremities:  No clubbing, cyanosis, edema, or deformity noted with normal full range of motion of all joints.   Skin:  Intact without suspicious lesions or rashes Psych:   Oriented X3, normally interactive, good eye contact, not anxious appearing, and not depressed appearing.     Impression & Recommendations:  Problem # 1:  HYPERTENSION (ICD-401.9) decrease norvasc to 5 mg  Her updated medication list for this problem includes:    Norvasc 5 Mg Tabs (Amlodipine besylate) .[name]... 1 by mouth once daily  BP today: 100/62 Prior BP: 106/72 (04/07/2009)  Labs Reviewed: K+: 4.2 (02/15/2009) Creat: : 0.8 (02/15/2009)   Chol: 160 (12/09/2007)   HDL: 44.4 (12/09/2007)   LDL: 100 (12/09/2007)   TG: 76 (12/09/2007)  Problem # 2:  GERD (ICD-530.81)  Her updated medication list for this problem includes:    Prilosec 40 Mg Cpdr (Omeprazole) .[name]... Take 1 tablet by mouth once a day  Diagnostics Reviewed:  UGI: gastritis (06/02/2008) EGD: Location: [facility]   (06/02/2008) Discussed lifestyle modifications, diet, antacids/medications, and preventive measures. Handout provided.   Problem # 3:  B12 DEFICIENCY (ICD-266.2)  Problem # 4:  ATRIAL FIBRILLATION (ICD-427.31)  Her updated medication list for this problem includes:    Norvasc 5 Mg Tabs (Amlodipine besylate) .[name]... 1 by mouth once daily    Flecainide Acetate 50 Mg Tabs (Flecainide acetate) .[name]... 1 tablet by mouth every morning and 1/2 tablet po every night  Reviewed the following: PT: 13.3 (11/17/2008)   INR: 1.0 (11/17/2008)  Problem # 5:  CHRONIC PAIN SYNDROME (ICD-338.4)  Problem # 6:  OSTEOPOROSIS (ICD-733.00)  Problem # 7:  CELLULITIS AND ABSCESS OF UNSPECIFIED SITE (ICD-682.9)  Her updated medication list for this problem includes:    Zithromax Z-pak 250 Mg Tabs (Azithromycin) .[name]... As directed  Orders: T-Culture, Wound (87070/[id number])  Elevate affected area. Warm moist compresses for 20 minutes every 2 hours while awake. Take antibiotics as directed and take acetaminophen as needed. To be seen in 48-72 hours if no improvement, sooner if worse.  Complete Medication List: 1)   Spiriva Handihaler 18 Mcg Caps (Tiotropium bromide monohydrate) .... One puff once daily 2)  Ventolin Hfa 108 (90 Base) Mcg/act Aers (Albuterol sulfate) .... Two puffs up to four times per day as needed 3)  Endocet 10-325 Mg Tabs (Oxycodone-acetaminophen) .... Take one tablet every 6-8 hours as needed for pain. 4)  Fentanyl 50 Mcg/hr Pt72 (Fentanyl) .... Change patch every three days 5)  Mobic 15 Mg Tabs (Meloxicam) .... Take 1/2 - 1 by mouth once daily 6)  Prilosec 40 Mg Cpdr (Omeprazole) .... Take 1 tablet by mouth once a day 7)  Norvasc 5 Mg Tabs (Amlodipine besylate) .[name].. 1 by mouth once daily 8)  Flecainide Acetate 50 Mg Tabs (Flecainide acetate) .[name].. 1 tablet by mouth every morning and 1/2 tablet po every night 9)  Temazepam 15 Mg Caps (Temazepam) .... Take one to two capsules at bedtime 10)  Cyclobenzaprine Hcl 10 Mg Tabs (Cyclobenzaprine hcl) .... Take one tablet three times daily 11)  Gabapentin 600 Mg Tabs (Gabapentin) .... Take one tablet 4 times daily 12)  Promethazine-codeine 6.25-10 Mg/12ml Syrp (Promethazine-codeine) .... 5 ml by mouth every 4-6 hours as needed,  13)  Zofran 8 Mg Tabs (  Ondansetron hcl) .[name].. 1 by mouth q12 h as needed 14)  Foradil Aerolizer 12 Mcg Caps (Formoterol fumarate) .... One puff two times a day 15)  Zithromax Z-pak 250 Mg Tabs (Azithromycin) .... As directed  Patient Instructions: 1)  rto 3 months

## 2010-04-18 NOTE — Progress Notes (Signed)
Summary: Refills   Phone Note Refill Request Message from:  Patient  Refills Requested: Medication #1:  ENDOCET 10-325 MG  TABS Take one tablet every 6-8 hours as needed for pain.   Last Refilled: 03/16/2009   Notes: #120  Medication #2:  FENTANYL 50 MCG/HR  PT72 change patch every three days   Last Refilled: 03/16/2009   Notes: #10 Last OV- 02/15/09    Method Requested: Pick up at Office Initial call taken by: Army Fossa CMA,  April 15, 2009 8:48 AM  Follow-up for Phone Call        ok to fill her meds every month as long as it is not early.  Follow-up by: Loreen Freud DO,  April 15, 2009 9:13 AM    Prescriptions: FENTANYL 50 MCG/HR  PT72 (FENTANYL) change patch every three days  #10 x 0   Entered by:   Army Fossa CMA   Authorized by:   Loreen Freud DO   Signed by:   Army Fossa CMA on 04/15/2009   Method used:   Print then Give to Patient   RxID:   [id number] ENDOCET 10-325 MG  TABS (OXYCODONE-ACETAMINOPHEN) Take one tablet every 6-8 hours as needed for pain.  #120 x 0   Entered by:   Army Fossa CMA   Authorized by:   Loreen Freud DO   Signed by:   Army Fossa CMA on 04/15/2009   Method used:   Print then Give to Patient   RxID:   [id number]   Appended Document: Refills  left messge for pt that rx's were upfront.

## 2010-04-18 NOTE — Progress Notes (Signed)
Summary: Refill Request  Phone Note Refill Request Call back at [phone] Message from:  Pharmacy on Aug 12, 2009 10:32 AM  Refills Requested: Medication #1:  MOBIC 15 MG TABS take 1/2 - 1 by mouth once daily   Dosage confirmed as above?Dosage Confirmed   Brand Name Necessary? No   Supply Requested: 1 month   Last Refilled: 07/13/2009 [facility]  Next Appointment Scheduled: 6.16.11 Initial call taken by: Harold Barban,  Aug 12, 2009 10:34 AM  Follow-up for Phone Call        last ov- 05/31/2009. Army Fossa CMA  Aug 12, 2009 11:00 AM   Additional Follow-up for Phone Call Additional follow up Details #1::        sent Additional Follow-up by: Loreen Freud DO,  Aug 12, 2009 11:46 AM    rPrescriptions: MOBIC 15 MG TABS (MELOXICAM) take 1/2 - 1 by mouth once daily  #30 x 2   Entered and Authorized by:   Loreen Freud DO   Signed by:   Loreen Freud DO on 08/12/2009   Method used:   Electronically to        [facility] (retail)       [address]       [place], [place]  [id number]       Ph: [phone]       Fax: [phone]   RxID:   [id number]

## 2010-04-18 NOTE — Assessment & Plan Note (Signed)
Summary: cough/ok per Megan/apc   Copy to:  Dr. Laury Axon Primary Provider/Referring Provider:  Loreen Freud DO  CC:  has finished 2 rounds of avelox and a pred taper but still c/o croupy cough producing "dirty white" mucus, increased SOB, and wheezing - states is slightly improved since last OV.  History of Present Illness: 65 y/o female presents to clinic with c/o fatigue, fevers, chills and productive cough with creamy/white secretions.  Denies n/v/d.  Previously seen in office on 6/11 and 6/17 for similar symptoms and was treated with approx 2 weeks of avelox and prednisone taper. CT of chest on 6/17 revealed PNA and R pleural effusion.  She is followed by [facility] for her PICC line.    Medications Prior to Update: 1)  Endocet 10-325 Mg  Tabs (Oxycodone-Acetaminophen) .... Take One Tablet Every 6-8 Hours As Needed For Pain. 2)  Fentanyl 50 Mcg/hr  Pt72 (Fentanyl) .... Change Patch Every Three Days 3)  Prilosec 40 Mg Cpdr (Omeprazole) .... Take 1 Tablet By Mouth Once A Day 4)  Cardura 4 Mg  Tabs (Doxazosin Mesylate) .... Take One Tablet Daily 5)  Amitriptyline Hcl 25 Mg  Tabs (Amitriptyline Hcl) .... Take 2 Tablets Every Evening At Bedtime As Needed 6)  Mobic 15 Mg Tabs (Meloxicam) .... Take 1/2 - 1 By Mouth Once Daily 7)  Temazepam 15 Mg  Caps (Temazepam) .... Take One To Two Capsules At Bedtime 8)  Cyclobenzaprine Hcl 10 Mg  Tabs (Cyclobenzaprine Hcl) .... Take One Tablet Three Times Daily 9)  Gabapentin 600 Mg  Tabs (Gabapentin) .... Take One Tablet 4 Times Daily 10)  Norvasc 10 Mg  Tabs (Amlodipine Besylate) .[name].. 1 By Mouth Qd 11)  Promethazine Hcl 25 Mg  Tabs (Promethazine Hcl) .... Take One Tablet  Q 4-6 Hrs As Needed For Nausea 12)  Flecainide Acetate 50 Mg  Tabs (Flecainide Acetate) .[name].. 1 Tablet By Mouth Every Morning and 1/2 Tablet Po Every Night 13)  Spiriva Handihaler 18 Mcg Caps (Tiotropium Bromide Monohydrate) .... One Puff Once Daily 14)  Ventolin Hfa 108 (90 Base)  Mcg/act Aers (Albuterol Sulfate) .... Two Puffs Up To Four Times Per Day As Needed 15)  Avelox 400 Mg Tabs (Moxifloxacin Hcl) .... One By Mouth Once Daily 16)  Prednisone 10 Mg Tabs (Prednisone) .... 3 Once Daily For Two Days, 2 Once Daily For Two Days, 1 Once Daily For 2 Days  Current Medications (verified): 1)  Endocet 10-325 Mg  Tabs (Oxycodone-Acetaminophen) .... Take One Tablet Every 6-8 Hours As Needed For Pain. 2)  Fentanyl 50 Mcg/hr  Pt72 (Fentanyl) .... Change Patch Every Three Days 3)  Prilosec 40 Mg Cpdr (Omeprazole) .... Take 1 Tablet By Mouth Once A Day 4)  Cardura 4 Mg  Tabs (Doxazosin Mesylate) .... Take One Tablet Daily 5)  Amitriptyline Hcl 25 Mg  Tabs (Amitriptyline Hcl) .... Take 2 Tablets Every Evening At Bedtime As Needed 6)  Mobic 15 Mg Tabs (Meloxicam) .... Take 1/2 - 1 By Mouth Once Daily 7)  Temazepam 15 Mg  Caps (Temazepam) .... Take One To Two Capsules At Bedtime 8)  Cyclobenzaprine Hcl 10 Mg  Tabs (Cyclobenzaprine Hcl) .... Take One Tablet Three Times Daily 9)  Gabapentin 600 Mg  Tabs (Gabapentin) .... Take One Tablet 4 Times Daily 10)  Norvasc 10 Mg  Tabs (Amlodipine Besylate) .[name].. 1 By Mouth Qd 11)  Promethazine Hcl 25 Mg  Tabs (Promethazine Hcl) .... Take One Tablet  Q 4-6 Hrs As Needed  For Nausea 12)  Flecainide Acetate 50 Mg  Tabs (Flecainide Acetate) .[name].. 1 Tablet By Mouth Every Morning and 1/2 Tablet Po Every Night 13)  Spiriva Handihaler 18 Mcg Caps (Tiotropium Bromide Monohydrate) .... One Puff Once Daily 14)  Ventolin Hfa 108 (90 Base) Mcg/act Aers (Albuterol Sulfate) .... Two Puffs Up To Four Times Per Day As Needed  Allergies (verified): 1)  ! Talwin 2)  ! Sulfa  Past History:  Past Medical History: Last updated: 07/29/2008 Anemia-iron deficiency Hypertension Osteoarthritis GERD CAD Left bundle branch block Atrial fibrillation - Dr. Roger Shelter Chronic pain Fibromyalgia Diverticulosis, colon COPD/Emphysema      -06/28/08 PFT FEV1 1.95  (97%), FVC 2.79 (101%), FEV1% 70, TLC 3.88 (87%), DLCO 64% Pneumonia      - 03/09, 12/09, 02/10 Exudative pleural effusion s/p right thoracentesis 03/09  Past Surgical History: Last updated: 05/12/2008 Intestinal resection (5times) Hysterectomy-complete Appendectomy Bladder lift Tonsillectomy Lysis of intestinal adhesions-numerous surgeries PICC Line TAH  Family History: Last updated: 05/12/2008 Family History of Heart Disease: Father Family History of CVA: Mother No FH of Colon Cancer:  Social History: Last updated: 05/12/2008 Occupation: Disabled Alcohol Use - no Illicit Drug Use - no Patient is a former smoker.  Daily Caffeine Use  Risk Factors: Caffeine Use: 1 (10/09/2007)  Risk Factors: Smoking Status: quit (10/09/2007)  Vital Signs:  Patient profile:   65 year old female Height:      62 inches Weight:      96.13 pounds BMI:     17.65 O2 Sat:      93 % on Room air Temp:     99.7 degrees F oral Pulse rate:   99 / minute BP sitting:   108 / 68  (right arm) Cuff size:   regular  Vitals Entered By: Boone Master CNA (September 08, 2008 3:36 PM)  O2 Flow:  Room air CC: has finished 2 rounds of avelox and a pred taper but still c/o croupy cough producing "dirty white" mucus, increased SOB, wheezing - states is slightly improved since last OV Is Patient Diabetic? No Comments Medications reviewed with patient Boone Master CNA  September 08, 2008 3:36 PM   Daytime contact number verified with patient. Boone Master CNA  September 08, 2008 3:36 PM    Physical Exam  Additional Exam:  General: well developed adult in NAD HEENT: [place]/AT.  Mucus membranes pink/moist.  No JVD. Neuro: Awake, alert, oriented.  MAE, speech clear.  EOMI, PERRLA CV: S1S2 RRR, no murmurs/rubs/gallops.  Radial pulses +2.   Pulm: Resp's even and non-labored.  Lungs: clear on left, right diminished with end inspiratory crackles posterior/lateral lower lobe. GI: abdomen round/soft, non-tender to  palpation.  BSx4 active. No organomegaly. Skin: no rashes or lesions Ext: warm/dry.  No edema      Impression & Recommendations:  Problem # 1:  PNEUMONIA (ICD-486)  Known small right pleural effusion and pneumona in setting of COPD.  Finished avelox and pred taper -feels no better. Plan: - Doxycycline to cover atypicals.  May need to use PICC line for IV abx if no better.   -follow up with Dr. Craige Cotta in one week -promethazine/codiene syrup for cough  The following medications were removed from the medication list:    Avelox 400 Mg Tabs (Moxifloxacin hcl) ..... One by mouth once daily Her updated medication list for this problem includes:    Doxycycline Hyclate 100 Mg Caps (Doxycycline hyclate) ..... One tab two times a day for 7 days  Orders:  Prescription Created Electronically [phone]) Est. Patient Level III (60454)  Problem # 2:  PLEURAL EFFUSION, RIGHT (ICD-511.9)  R pleural effusion.   See above.    Orders: Prescription Created Electronically [phone]) Est. Patient Level III (91478)  Medications Added to Medication List This Visit: 1)  Doxycycline Hyclate 100 Mg Caps (Doxycycline hyclate) .... One tab two times a day for 7 days 2)  Promethazine-codeine 6.25-10 Mg/61ml Syrp (Promethazine-codeine) .... 5 ml by mouth every 4-6 hours as needed,   Patient Instructions: 1)  Follow up with Dr. Craige Cotta in one week 2)  Doxycycline 100mg  by mouth twice daily for seven days 3)  Promethazine/codiene cough syrup 5 ml (one teaspoon) every 4-6 hours as needed for coughing.  (no driving when using cough syrup-may make you sleepy) Prescriptions: PROMETHAZINE-CODEINE 6.25-10 MG/5ML SYRP (PROMETHAZINE-CODEINE) 5 ml by mouth every 4-6 hours as needed,   #200 ml x 0   Entered and Authorized by:   Canary Brim NP-C   Signed by:   Canary Brim NP-C on 09/08/2008   Method used:   Telephoned to ...       [facility] (retail)       [address]        [place], [place]  [address]       Ph: [phone]       Fax: [phone]   RxID:   [id number] DOXYCYCLINE HYCLATE 100 MG CAPS (DOXYCYCLINE HYCLATE) one tab two times a day for 7 days  #14 x 0   Entered and Authorized by:   Canary Brim NP-C   Signed by:   Canary Brim NP-C on 09/08/2008   Method used:   Electronically to        [facility] (retail)       [address]       [place], [place]  [address]       Ph: [phone]       Fax: [phone]   RxID:   [id number]   Appended Document: cough/ok per Megan/apc     Allergies: 1)  ! Talwin 2)  ! Sulfa  Review of Systems General:  Complains of fever and chills. ENT:  Denies nasal congestion, nosebleeds, and sore throat. CV:  Denies difficulty breathing at night, chest pain or discomfort, lightheadedness, palpitations, and swelling of hands or feet. Resp:  Denies cough, shortness of breath, difficulty breathing at rest, coughing up blood, and wheezing. GI:  Denies nausea, vomiting, and diarrhea.  Appended Document: cough/ok per Megan/apc I have reviewed the plan of care and agree with the above note.  pw

## 2010-04-18 NOTE — Progress Notes (Signed)
Summary: refill on doxazosin  Phone Note Refill Request   Refills Requested: Medication #1:  doxazosin mesylate 4mg  # 30 take 1 each day   Last Refilled: 03/17/2007  Medication #2:  GABAPENTIN 600 MG  TABS Take one tablet 4 times daily   Last Refilled: 03/21/2007 rx received via fax from [facility] fax is [phone]  Initial call taken by: Job Founds,  April 18, 2007 12:45 PM      Prescriptions: CARDURA 4 MG  TABS (DOXAZOSIN MESYLATE) Take one tablet daily  #30 x 3   Entered by:   Ardyth Man   Authorized by:   Leanne Chang MD   Signed by:   Ardyth Man on 04/18/2007   Method used:   Electronically sent to ...       [facility]       [address]       [place], [place]  [address]       Ph: [phone]       Fax: [phone]   RxID:   [id number] GABAPENTIN 600 MG  TABS (GABAPENTIN) Take one tablet 4 times daily  #120 x 1   Entered by:   Ardyth Man   Authorized by:   Leanne Chang MD   Signed by:   Ardyth Man on 04/18/2007   Method used:   Electronically sent to ...       [facility]       [address]       [place], [place]  [address]       Ph: [phone]       Fax: [phone]   RxID:   [id number]

## 2010-04-18 NOTE — Assessment & Plan Note (Signed)
Summary: rml pna/apc   Visit Type:  Initial Consult Referred by:  Dr. Laury Axon PCP:  Loreen Freud, M.D.  Chief Complaint:  pulmonary consult....Marland KitchenMarland Kitchenpneumonia 3 times......Marland Kitchenreviewed meds.  History of Present Illness: I met [name] for evaluation of her recurrent pneumonia.  She had an episode of pneumonia in May, 2009.  She was hospitalized, and treated with intravenous antibiotics.  She was evaluated at that time by Dr. Shan Levans with pulmonary service.  She also had a right pleural effusion.  Thoracentesis was done by intervention radiology, and had 580 ml of fluid removed.  This showed a protein of < 3, LDH was 389, and WBC was 11,270 with 79% neutrophils.    She had recurrent episode of pneumonia in December, 2009 again involving the right mid lung field.  She was treated with avelox, and improved.    She developed respiratory symptoms again in February, 2010.  Chest xray showed some persistence of the right middle lobe infiltrate.  She was treated with avelox, and zithromax.  She was having a cough, wheeze, and sputum production.  She would also get tired easily.  She denies chest pain.  She is not having fever.  She does get allergies, but does not have frequent sinus infections.  She sees Dr. [name] for reflux, and is scheduled to have endoscopy in March.  She does feel like food sometimes gets stuck in her throat.  She has been losing some weight.  She denies hemoptysis.  There is no history of tuberculosis.  She denies travel history, occupational exposures, animal exposures, or recent sick exposures.  She did smoke cigarettes, but quit in 1990.  She will get nauseous sometimes after eating.  She has a history of recurrent small bowel obstruction, and has undergone several surgies for this.    Current Allergies: ! TALWIN ! SULFA Past Medical History:    Anemia-iron deficiency    Hypertension    Osteoarthritis    GERD    CAD    Left bundle branch block    Atrial  fibrillation - Dr. Roger Shelter    Chronic pain    Fibromyalgia  Past Surgical History:    Intestinal resection (5times)    Hysterectomy-complete    Appendectomy    Bladder lift    Tonsillectomy    Lysis of intestinal adhesions-numerous surgeries    PICC Line    TAH  Family History:    Family History of Heart Disease: Father    Family History of CVA: Mother    No FH of Colon Cancer:      Social History:    Occupation: Disabled    Alcohol Use - no    Illicit Drug Use - no    Patient is a former smoker.     Daily Caffeine Use       Vital Signs:  Patient Profile:   65 Years Old Female Height:     62 inches Weight:      97.13 pounds BMI:     17.83 O2 Sat:      98 % O2 treatment:    Room Air Temp:     98.3 degrees F oral Pulse rate:   105 / minute BP sitting:   100 / 58  (left arm) Cuff size:   regular  Pt. in pain?   no  Vitals Entered By: Clarise Cruz Duncan Dull) (May 12, 2008 10:50 AM)  Physical Exam  General:     thin.   Eyes:     PERRLA and EOMI.   Ears:     cerumen impaction bilateral Nose:     no deformity, discharge, inflammation, or lesions Mouth:     no deformity or lesions Neck:     no masses, thyromegaly, or abnormal cervical nodes Chest Wall:     no deformities noted Lungs:     faint basilar crackles Heart:     regular rate and rhythm, S1, S2 without murmurs, rubs, gallops, or clicks Abdomen:     thin, soft, no masses, non-tender, normal bowel sounds Pulses:     pulses normal Extremities:     no clubbing, cyanosis, edema, or deformity noted Neurologic:     normal strength Cervical Nodes:     no significant adenopathy Psych:     anxious.     Impression & Recommendations:  Problem # 1:  PNEUMONIA, RIGHT MIDDLE LOBE (ICD-486) She has recurrent episodes of pneumonia.  She appears to be recovering from her most recent episode.  I will arrange for her to have a CT chest to determine if she has a structural  lesion which is causing her to get pneumonia.  Depending on the results of this I will decide if airway inspection is required with bronchoscopy.  Patient Instructions: 1)  Will schedule CT chest 2)  Follow up in 2 weeks

## 2010-04-18 NOTE — Progress Notes (Signed)
Summary: [facility] 06/17/08  Phone Note Outgoing Call Call back at Home Phone [phone]   Call placed by: Ardyth Man,  June 17, 2008 12:55 PM Call placed to: Patient Summary of Call: Left message for patient to call the office received paper from [facility] for picc line. Ardyth Man  June 17, 2008 12:56 PM Faxed order back. Ardyth Man  June 17, 2008 12:59 PM   Follow-up for Phone Call        Patient aware Ardyth Man  June 17, 2008 1:00 PM  Follow-up by: Ardyth Man,  June 17, 2008 1:01 PM

## 2010-04-18 NOTE — Progress Notes (Signed)
Summary: labs  Phone Note Outgoing Call   Call placed by: Doristine Devoid,  December 16, 2007 9:32 AM Call placed to: Patient Summary of Call: OK----recheck ---6 months------401.9  bmp, hep, lipid vita d is low---50000 weekly------with vita D3 2000 u daily (Otc)----recheck in 1 month spoke with patient aware of labs and schedule appt to recheck labs  Initial call taken by: Doristine Devoid,  December 16, 2007 9:33 AM    New/Updated Medications: VITAMIN D 16109 UNIT CAPS (ERGOCALCIFEROL) take one tablet weekly   Prescriptions: VITAMIN D 60454 UNIT CAPS (ERGOCALCIFEROL) take one tablet weekly  #4 x 1   Entered by:   Doristine Devoid   Authorized by:   Loreen Freud DO   Signed by:   Doristine Devoid on 12/16/2007   Method used:   Electronically to        [facility] (retail)       [address]       [place], [place]  [address]       Ph: [phone]       Fax: [phone]   RxID:   [id number]

## 2010-04-18 NOTE — Progress Notes (Signed)
Summary: [facility]--rx  Phone Note Refill Request   Refills Requested: Medication #1:  NORVASC 10 MG  TABS 1 by mouth qd [facility]--[phone] fax--[phone]----Last filled--12.15.09  Initial call taken by: Freddy Jaksch,  March 31, 2008 8:28 AM      Prescriptions: NORVASC 10 MG  TABS (AMLODIPINE BESYLATE) 1 by mouth qd  #30 x 3   Entered by:   Kandice Hams   Authorized by:   Loreen Freud DO   Signed by:   Kandice Hams on 03/31/2008   Method used:   Faxed to ...       [facility] (retail)       [address]       [place], [place]  [address]       Ph: [phone]       Fax: [phone]   RxID:   [id number]

## 2010-04-18 NOTE — Assessment & Plan Note (Signed)
Summary: recheck - pneumonia/cbs   Vital Signs:  Patient Profile:   65 Years Old Female Height:     62 inches Weight:      99.6 pounds Temp:     98.1 degrees F oral Pulse rate:   97 / minute Resp:     18 per minute BP sitting:   90 / 60  (right arm)  Vitals Entered By: Ardyth Man (April 29, 2008 9:59 AM)                 PCP:  Loreen Freud, M.D.  Chief Complaint:  pneumonia recheck.  History of Present Illness: Pt here to recheck pneumonia---pt still with cough and sided back pain.      Current Allergies: ! TALWIN ! SULFA  Past Medical History:    Reviewed history from 05/30/2007 and no changes required:       Anemia-iron deficiency       Hypertension       Osteoarthritis       GERD     Review of Systems      See HPI   Physical Exam  General:     Well-developed,well-nourished,in no acute distress; alert,appropriate and cooperative throughout examination Neck:     No deformities, masses, or tenderness noted. Lungs:     normal respiratory effort, no intercostal retractions, and R decreased breath sounds.   Heart:     normal rate and regular rhythm.   Psych:     Oriented X3, memory intact for recent and remote, and normally interactive.      Impression & Recommendations:  Problem # 1:  PNEUMONIA, RIGHT MIDDLE LOBE (ICD-486)  Her updated medication list for this problem includes:    Avelox 400 Mg Tabs (Moxifloxacin hcl) .[name]... 1 by mouth once daily    Zithromax Z-pak 250 Mg Tabs (Azithromycin) .[name]... 2 by mouth once daily day 1 then 1 by mouth once daily for 4 more days  Orders: Pulmonary Referral (Pulmonary) Rocephin  250mg  (T2458) Admin of Therapeutic Inj  intramuscular or subcutaneous (09983) Instructed patient to complete antibiotics, and call for worsened shortness of breath or new symptoms.   Complete Medication List: 1)  Endocet 10-325 Mg Tabs (Oxycodone-acetaminophen) .... Take one tablet every 6-8 hours as needed for pain. 2)   Fentanyl 50 Mcg/hr Pt72 (Fentanyl) .... Change patch every three days 3)  Kapidex 60 Mg Cpdr (Dexlansoprazole) .[name].. 1 by mouth qd 4)  Cardura 4 Mg Tabs (Doxazosin mesylate) .... Take one tablet daily 5)  Enablex 7.5 Mg Tb24 (Darifenacin hydrobromide) .... Take daily as directed 6)  Amitriptyline Hcl 25 Mg Tabs (Amitriptyline hcl) .... Take 2 tablets every evening at bedtime 7)  Celebrex 200 Mg Caps (Celecoxib) .... Take one tablet twice daily 8)  Temazepam 15 Mg Caps (Temazepam) .... Take one to two capsules at bedtime 9)  Cyclobenzaprine Hcl 10 Mg Tabs (Cyclobenzaprine hcl) .... Take one tablet three times daily 10)  Gabapentin 600 Mg Tabs (Gabapentin) .... Take one tablet 4 times daily 11)  Norvasc 10 Mg Tabs (Amlodipine besylate) .[name].. 1 by mouth qd 12)  Promethazine Hcl 25 Mg Tabs (Promethazine hcl) .... Take one tablet  q 4-6 hrs as needed for nausea 13)  Fosamax 70 Mg Tabs (Alendronate sodium) .... Take as directed weekly 14)  Flecainide Acetate 50 Mg Tabs (Flecainide acetate) .[name].. 1 tablet by mouth every morning and 1/2 tablet po every night 15)  Vitamin D 38250 Unit Caps (Ergocalciferol) .... Take one tablet weekly  16)  Avelox 400 Mg Tabs (Moxifloxacin hcl) .[name].. 1 by mouth once daily 17)  Zithromax Z-pak 250 Mg Tabs (Azithromycin) .... 2 by mouth once daily day 1 then 1 by mouth once daily for 4 more days    Prescriptions: ZITHROMAX Z-PAK 250 MG TABS (AZITHROMYCIN) 2 by mouth once daily DAY 1 THEN 1 by mouth once daily FOR 4 MORE DAYS  #6 x 0   Entered and Authorized by:   Loreen Freud DO   Signed by:   Loreen Freud DO on 04/29/2008   Method used:   Electronically to        [facility] (retail)       [address]       [place], [place]  [address]       Ph: [phone]       Fax: [phone]   RxID:   [id number] ZITHROMAX Z-PAK 250 MG TABS (AZITHROMYCIN) 2 by mouth once daily DAY 1 THEN 1 by mouth once daily FOR 4 MORE DAYS  #1 x 0   Entered and  Authorized by:   Loreen Freud DO   Signed by:   Loreen Freud DO on 04/29/2008   Method used:   Print then Give to Patient   RxID:   [id number] FENTANYL 50 MCG/HR  PT72 (FENTANYL) change patch every three days  #10 x 0   Entered by:   Ardyth Man   Authorized by:   Loreen Freud DO   Signed by:   Ardyth Man on 04/29/2008   Method used:   Print then Give to Patient   RxID:   [id number] ENDOCET 10-325 MG  TABS (OXYCODONE-ACETAMINOPHEN) Take one tablet every 6-8 hours as needed for pain.  #120 x 0   Entered by:   Ardyth Man   Authorized by:   Loreen Freud DO   Signed by:   Ardyth Man on 04/29/2008   Method used:   Print then Give to Patient   RxID:   [id number]    Medication Administration  Injection # 1:    Medication: Rocephin  250mg     Diagnosis: PNEUMONIA, RIGHT MIDDLE LOBE (ICD-486)    Route: IM    Site: RUOQ gluteus    Exp Date: 10/18/2010    Lot #: [id number]    Mfr: novaplus    Patient tolerated injection without complications    Given by: Jeremy Johann CMA (April 29, 2008 10:30 AM)  Orders Added: 1)  Pulmonary Referral [Pulmonary] 2)  Rocephin  250mg  [J0696] 3)  Admin of Therapeutic Inj  intramuscular or subcutaneous [96372] 4)  Est. Patient Level III [23557]

## 2010-04-18 NOTE — Letter (Signed)
[facility]   Imported By: Freddy Jaksch 04/01/2007 13:54:11  _____________________________________________________________________  External Attachment:    Type:   Image     Comment:   INTER

## 2010-04-18 NOTE — Assessment & Plan Note (Signed)
Summary: MONTHLY B12 SHOT...LSW.  Nurse Visit   Allergies: 1)  ! Talwin 2)  ! Sulfa  Medication Administration  Injection # 1:    Medication: Vit B12 1000 mcg    Diagnosis: B12 DEFICIENCY (ICD-266.2)    Route: IM    Site: L deltoid    Exp Date: 08/2011    Lot #: [id number]    Mfr: [employer]    Comments: pt to schedule next monthly b12 at front desk    Patient tolerated injection without complications    Given by: Chales Abrahams CMA (AAMA) (October 17, 2009 10:42 AM)  Orders Added: 1)  Vit B12 1000 mcg [J3420] 02

## 2010-04-18 NOTE — Progress Notes (Signed)
  Phone Note Outgoing Call   Call placed by: Ardyth Man,  September 09, 2006 9:52 AM Call placed to: Patient Summary of Call: [facility] when she gets back from her vacation. ...................................................................Ardyth Man  September 09, 2006 9:52 AM   Follow-up for Phone Call        Pt. c/o and is aware. ...................................................................Ardyth Man  September 16, 2006 3:05 PM  Follow-up by: Ardyth Man,  September 16, 2006 3:05 PM

## 2010-04-18 NOTE — Letter (Signed)
Summary: Results Follow-up Letter  [place] [facility]  [address] [place], [place] [address]   Phone: [phone]  Fax: [phone]    09/03/2007        [name] [address] [place], [place]  [address]  Dear Ms. Pavek,   The following are the results of your recent test(s):  Test     Result     Pap Smear    Normal_______  Not Normal_____       Comments: _________________________________________________________ Cholesterol LDL(Bad cholesterol):          Your goal is less than:         HDL (Good cholesterol):        Your goal is more than: _________________________________________________________ Other Tests:   _________________________________________________________  Please call for an appointment Or _Please see attached.________________________________________________________ _________________________________________________________ _________________________________________________________  Sincerely,  Ardyth Man [place] at [facility]

## 2010-04-18 NOTE — Assessment & Plan Note (Signed)
Summary: CHEST TIGHT RADIATING UP TO NECK EPISODE THIS MORNING--OK PER...   Vital Signs:  Patient Profile:   65 Years Old Female Height:     62 inches Weight:      97.4 pounds Temp:     98.0 degrees F oral Pulse rate:   113 / minute BP sitting:   100 / 90  (left arm)  Pt. in pain?   no  Vitals Entered By: Jeremy Johann CMA (February 03, 2008 1:36 PM)                  PCP:  Loreen Freud, M.D.  Chief Complaint:  Chest Pain.  History of Present Illness:       This is a 65 years old female who presents with Chest pain.  The symptoms began duration  0-15 min ago.  Pt here c/o episode of chest pain this afternoon that lasted less then 5 minutes and radiates to neck and jaw b/l.  Pt now feels fine with no symptoms.  The patient reports resting chest pain, but denies exertional chest pain, nausea, vomiting, diaphoresis, shortness of breath, palpitations, dizziness, light headedness, syncope, and indigestion.  The pain is described as intermittent and pressure-like.  The pain is located in the epigastric area.  The pain radiates to the neck and jaw.  Episodes of chest pain last 2-5 minutes.      Current Allergies (reviewed today): ! TALWIN ! SULFA  Past Medical History:    Reviewed history from 05/30/2007 and no changes required:       Anemia-iron deficiency       Hypertension       Osteoarthritis       GERD  Past Surgical History:    Reviewed history from 08/07/2007 and no changes required:       Intestinal resection (5times)       Hysterectomy-complete       Appendectomy       Bladder lift       Tonsillectomy       Lysis of intestinal adhesions-numerous surgeries       PICC Line   Family History:    Reviewed history from 10/09/2007 and no changes required:       Family History of Heart Disease: Father       Family History of CVA: Mother       No FH of Colon Cancer:  Social History:    Reviewed history from 10/09/2007 and no changes required:       Occupation:  Disabled       Alcohol Use - no       Illicit Drug Use - no       Patient is a former smoker.        Daily Caffeine Use   Risk Factors: Tobacco use:  quit    Year quit:  20 years ago Drug use:  no Caffeine use:  1 drinks per day Alcohol use:  no   Review of Systems      See HPI   Physical Exam  General:     Well-developed,well-nourished,in no acute distress; alert,appropriate and cooperative throughout examination Ears:     External ear exam shows no significant lesions or deformities.  Otoscopic examination reveals clear canals, tympanic membranes are intact bilaterally without bulging, retraction, inflammation or discharge. Hearing is grossly normal bilaterally. Nose:     External nasal examination shows no deformity or inflammation. Nasal mucosa are pink and moist without lesions or exudates.  Mouth:     Oral mucosa and oropharynx without lesions or exudates.  Teeth in good repair. Neck:     No deformities, masses, or tenderness noted. Lungs:     Normal respiratory effort, chest expands symmetrically. Lungs are clear to auscultation, no crackles or wheezes. Heart:     normal rate, regular rhythm, and no murmur.   Abdomen:     Bowel sounds positive,abdomen soft and non-tender without masses, organomegaly or hernias noted. Extremities:     No clubbing, cyanosis, edema, or deformity noted with normal full range of motion of all joints.   Neurologic:     alert & oriented X3, cranial nerves II-XII intact, and strength normal in all extremities.   Skin:     Intact without suspicious lesions or rashes Psych:     Oriented X3, memory intact for recent and remote, and normally interactive.      Impression & Recommendations:  Problem # 1:  CHEST PAIN UNSPECIFIED (ICD-786.50) Pt going to Dr Deborah Chalk today for eval--- if cp occurs again go to ER Orders: Venipuncture ([address]) TLB-BMP (Basic Metabolic Panel-BMET) (80048-METABOL) TLB-CBC Platelet - w/Differential  (85025-CBCD) TLB-Hepatic/Liver Function Pnl (80076-HEPATIC) TLB-TSH (Thyroid Stimulating Hormone) (84443-TSH) TLB-CK Total Only(Creatine Kinase/CPK) (82550-CK) TLB-CK-MB (Creatine Kinase MB) (82553-CKMB) T-D-Dimer Fibrin Derivatives Quantitive (60454-09811) Cardiology Referral (Cardiology) EKG w/ Interpretation (93000)   Complete Medication List: 1)  Endocet 10-325 Mg Tabs (Oxycodone-acetaminophen) .... Take one tablet every 6-8 hours as needed for pain. 2)  Fentanyl 50 Mcg/hr Pt72 (Fentanyl) .... Change patch every three days 3)  Omeprazole 20 Mg Cpdr (Omeprazole) .... Take one tablet daily as directed 4)  Cardura 4 Mg Tabs (Doxazosin mesylate) .... Take one tablet daily 5)  Enablex 7.5 Mg Tb24 (Darifenacin hydrobromide) .... Take daily as directed 6)  Amitriptyline Hcl 25 Mg Tabs (Amitriptyline hcl) .... Take 2 tablets every evening at bedtime 7)  Celebrex 200 Mg Caps (Celecoxib) .... Take one tablet twice daily 8)  Temazepam 15 Mg Caps (Temazepam) .... Take one to two capsules at bedtime 9)  Cyclobenzaprine Hcl 10 Mg Tabs (Cyclobenzaprine hcl) .... Take one tablet three times daily 10)  Gabapentin 600 Mg Tabs (Gabapentin) .... Take one tablet 4 times daily 11)  Norvasc 10 Mg Tabs (Amlodipine besylate) .[name].. 1 by mouth qd 12)  Promethazine Hcl 25 Mg Tabs (Promethazine hcl) .... Take one tablet  q 4-6 hrs as needed for nausea 13)  Fosamax 70 Mg Tabs (Alendronate sodium) .... Take as directed weekly 14)  Flecainide Acetate 50 Mg Tabs (Flecainide acetate) .[name].. 1 tablet by mouth every morning and 1/2 tablet po every night 15)  Vitamin D 91478 Unit Caps (Ergocalciferol) .... Take one tablet weekly    ]

## 2010-04-18 NOTE — Miscellaneous (Signed)
Summary: Order/[facility]  Order/[facility]   Imported By: Lester Tolland 01/27/2010 08:38:33  _____________________________________________________________________  External Attachment:    Type:   Image     Comment:   External Document

## 2010-04-18 NOTE — Assessment & Plan Note (Signed)
Summary: croopy, right lung trouble,tight in chest, weak/apc   Copy to:  Dr. Laury Axon Primary Provider/Referring Provider:  Loreen Freud DO  CC:  croupy cough with production, wheezing, increased SOB, low grade temp, increased fatigue, and PICC nurse stated right lung "didn't sound good." x5days.  History of Present Illness: 65  yo female with history of COPD and recurrent pneumonia.  January 28, 2009 -Presents for acute office visit. Complains of 5 days of croupy cough with production, wheezing, increased SOB, low grade temp, increased fatigue, PICC nurse stated right lung "didn't sound good." x5days. Denies chest pain,  orthopnea, hemoptysis, fever, n/v/d, edema, headache.,    Medications Prior to Update: 1)  Spiriva Handihaler 18 Mcg Caps (Tiotropium Bromide Monohydrate) .... One Puff Once Daily 2)  Ventolin Hfa 108 (90 Base) Mcg/act Aers (Albuterol Sulfate) .... Two Puffs Up To Four Times Per Day As Needed 3)  Endocet 10-325 Mg  Tabs (Oxycodone-Acetaminophen) .... Take One Tablet Every 6-8 Hours As Needed For Pain. 4)  Fentanyl 50 Mcg/hr  Pt72 (Fentanyl) .... Change Patch Every Three Days 5)  Mobic 15 Mg Tabs (Meloxicam) .... Take 1/2 - 1 By Mouth Once Daily 6)  Prilosec 40 Mg Cpdr (Omeprazole) .... Take 1 Tablet By Mouth Once A Day 7)  Cardura 4 Mg  Tabs (Doxazosin Mesylate) .... Take One Tablet Daily 8)  Norvasc 10 Mg  Tabs (Amlodipine Besylate) .[name].. 1 By Mouth Qd 9)  Flecainide Acetate 50 Mg  Tabs (Flecainide Acetate) .[name].. 1 Tablet By Mouth Every Morning and 1/2 Tablet Po Every Night 10)  Amitriptyline Hcl 25 Mg  Tabs (Amitriptyline Hcl) .... Take 2 Tablets Every Evening At Bedtime As Needed 11)  Temazepam 15 Mg  Caps (Temazepam) .... Take One To Two Capsules At Bedtime 12)  Cyclobenzaprine Hcl 10 Mg  Tabs (Cyclobenzaprine Hcl) .... Take One Tablet Three Times Daily 13)  Gabapentin 600 Mg  Tabs (Gabapentin) .... Take One Tablet 4 Times Daily 14)  Promethazine Hcl 25 Mg  Tabs  (Promethazine Hcl) .... Take One Tablet  Q 4-6 Hrs As Needed For Nausea 15)  Promethazine-Codeine 6.25-10 Mg/65ml Syrp (Promethazine-Codeine) .... 5 Ml By Mouth Every 4-6 Hours As Needed,  16)  Chromagen Forte .... Take One Tablet Daily 17)  Foradil Aerolizer 12 Mcg Caps (Formoterol Fumarate) .... One Puff Two Times A Day  Current Medications (verified): 1)  Spiriva Handihaler 18 Mcg Caps (Tiotropium Bromide Monohydrate) .... One Puff Once Daily 2)  Ventolin Hfa 108 (90 Base) Mcg/act Aers (Albuterol Sulfate) .... Two Puffs Up To Four Times Per Day As Needed 3)  Endocet 10-325 Mg  Tabs (Oxycodone-Acetaminophen) .... Take One Tablet Every 6-8 Hours As Needed For Pain. 4)  Fentanyl 50 Mcg/hr  Pt72 (Fentanyl) .... Change Patch Every Three Days 5)  Mobic 15 Mg Tabs (Meloxicam) .... Take 1/2 - 1 By Mouth Once Daily 6)  Prilosec 40 Mg Cpdr (Omeprazole) .... Take 1 Tablet By Mouth Once A Day 7)  Cardura 4 Mg  Tabs (Doxazosin Mesylate) .... Take One Tablet Daily 8)  Norvasc 10 Mg  Tabs (Amlodipine Besylate) .[name].. 1 By Mouth Qd 9)  Flecainide Acetate 50 Mg  Tabs (Flecainide Acetate) .[name].. 1 Tablet By Mouth Every Morning and 1/2 Tablet Po Every Night 10)  Amitriptyline Hcl 25 Mg  Tabs (Amitriptyline Hcl) .... Take 2 Tablets Every Evening At Bedtime As Needed 11)  Temazepam 15 Mg  Caps (Temazepam) .... Take One To Two Capsules At Bedtime 12)  Cyclobenzaprine Hcl  10 Mg  Tabs (Cyclobenzaprine Hcl) .... Take One Tablet Three Times Daily 13)  Gabapentin 600 Mg  Tabs (Gabapentin) .... Take One Tablet 4 Times Daily 14)  Promethazine Hcl 25 Mg  Tabs (Promethazine Hcl) .... Take One Tablet  Q 4-6 Hrs As Needed For Nausea 15)  Promethazine-Codeine 6.25-10 Mg/15ml Syrp (Promethazine-Codeine) .... 5 Ml By Mouth Every 4-6 Hours As Needed,  16)  Chromagen Forte .... Take One Tablet Daily 17)  Foradil Aerolizer 12 Mcg Caps (Formoterol Fumarate) .... One Puff Two Times A Day  Allergies (verified): 1)  !  Talwin 2)  ! Sulfa  Past History:  Past Medical History: Last updated: 09/16/2008 Anemia-iron deficiency Hypertension Osteoarthritis GERD CAD Left bundle branch block Atrial fibrillation - Dr. Roger Shelter Chronic pain Fibromyalgia Diverticulosis, colon COPD/Emphysema      -06/28/08 PFT FEV1 1.95 (97%), FVC 2.79 (101%), FEV1% 70, TLC 3.88 (87%), DLCO 64% Pneumonia      - 03/09, 12/09, 02/10, 06/10 Exudative pleural effusion s/p right thoracentesis 03/09  Past Surgical History: Last updated: 05/12/2008 Intestinal resection (5times) Hysterectomy-complete Appendectomy Bladder lift Tonsillectomy Lysis of intestinal adhesions-numerous surgeries PICC Line TAH  Family History: Last updated: 05/12/2008 Family History of Heart Disease: Father Family History of CVA: Mother No FH of Colon Cancer:  Social History: Last updated: 05/12/2008 Occupation: Disabled Alcohol Use - no Illicit Drug Use - no Patient is a former smoker.  Daily Caffeine Use  Risk Factors: Caffeine Use: 1 (10/09/2007)  Risk Factors: Smoking Status: quit (10/09/2007)  Review of Systems      See HPI  Vital Signs:  Patient profile:   65 year old female Height:      62 inches Weight:      90.38 pounds BMI:     16.59 O2 Sat:      98 % on Room air Temp:     98.0 degrees F oral Pulse rate:   91 / minute BP sitting:   104 / 64  (right arm) Cuff size:   regular  Vitals Entered By: Boone Master CNA (January 28, 2009 11:14 AM)  O2 Flow:  Room air CC: croupy cough with production, wheezing, increased SOB, low grade temp, increased fatigue, PICC nurse stated right lung "didn't sound good." x5days Is Patient Diabetic? No Comments Medications reviewed with patient Daytime contact number verified with patient. Boone Master CNA  January 28, 2009 11:13 AM    Physical Exam  Additional Exam:  General: frail female  HEENT: [place]/AT.  Mucus membranes pink/moist.  No JVD. Neuro: Awake, alert,  oriented.  MAE, speech clear.  EOMI, PERRLA CV: S1S2 RRR, no murmurs/rubs/gallops.  Radial pulses +2.   Pulm: Resp's even and non-labored, diminshed in bases, no wheezing.  GI: abdomen round/soft, non-tender to palpation.  BSx4 active. No organomegaly. Skin: no rashes or lesions Ext: warm/dry.  No edema      Impression & Recommendations:  Problem # 1:  CHRONIC OBSTRUCTIVE PULMONARY DISEASE, ACUTE EXACERBATION (ICD-491.21)  Avelox once daily w/ food for 7 days, finish all samples Mucinex DM two times a day as needed cough/congestion  Increase fluids.  Please contact office for sooner follow up if symptoms do not improve or worsen  follow up Dr. Craige Cotta in 1 month as scheduled. and as needed   Orders: Est. Patient Level III ([address]109)  Complete Medication List: 1)  Spiriva Handihaler 18 Mcg Caps (Tiotropium bromide monohydrate) .... One puff once daily 2)  Ventolin Hfa 108 (90 Base) Mcg/act Aers (Albuterol sulfate) .Marland KitchenMarland KitchenMarland Kitchen  Two puffs up to four times per day as needed 3)  Endocet 10-325 Mg Tabs (Oxycodone-acetaminophen) .... Take one tablet every 6-8 hours as needed for pain. 4)  Fentanyl 50 Mcg/hr Pt72 (Fentanyl) .... Change patch every three days 5)  Mobic 15 Mg Tabs (Meloxicam) .... Take 1/2 - 1 by mouth once daily 6)  Prilosec 40 Mg Cpdr (Omeprazole) .... Take 1 tablet by mouth once a day 7)  Cardura 4 Mg Tabs (Doxazosin mesylate) .... Take one tablet daily 8)  Norvasc 10 Mg Tabs (Amlodipine besylate) .[name].. 1 by mouth qd 9)  Flecainide Acetate 50 Mg Tabs (Flecainide acetate) .[name].. 1 tablet by mouth every morning and 1/2 tablet po every night 10)  Amitriptyline Hcl 25 Mg Tabs (Amitriptyline hcl) .... Take 2 tablets every evening at bedtime as needed 11)  Temazepam 15 Mg Caps (Temazepam) .... Take one to two capsules at bedtime 12)  Cyclobenzaprine Hcl 10 Mg Tabs (Cyclobenzaprine hcl) .... Take one tablet three times daily 13)  Gabapentin 600 Mg Tabs (Gabapentin) .... Take one tablet 4 times  daily 14)  Promethazine Hcl 25 Mg Tabs (Promethazine hcl) .... Take one tablet  q 4-6 hrs as needed for nausea 15)  Promethazine-codeine 6.25-10 Mg/16ml Syrp (Promethazine-codeine) .... 5 ml by mouth every 4-6 hours as needed,  16)  Chromagen Forte  .... Take one tablet daily 17)  Foradil Aerolizer 12 Mcg Caps (Formoterol fumarate) .... One puff two times a day  Patient Instructions: 1)  Avelox once daily w/ food for 7 days, finish all samples 2)  Mucinex DM two times a day as needed cough/congestion  3)  Increase fluids.  4)  Please contact office for sooner follow up if symptoms do not improve or worsen  5)  follow up Dr. Craige Cotta in 1 month as scheduled. and as needed   Appended Document: croopy, right lung trouble,tight in chest, weak/apc     Clinical Lists Changes  Orders: Added new Service order of Nebulizer Tx (09811) - Signed

## 2010-04-18 NOTE — Letter (Signed)
Summary: [facility]   Imported By: Freddy Jaksch 08/27/2007 10:40:55  _____________________________________________________________________  External Attachment:    Type:   Image     Comment:   External Document

## 2010-04-18 NOTE — Assessment & Plan Note (Signed)
   Vital Signs:  Patient Profile:   65 Years Old Female Temp:     97.9 degrees F oral Pulse rate:   78 / minute Resp:     16 per minute BP sitting:   130 / 90  (right arm)  Pt. in pain?   no  Vitals Entered By: Arnetha Gula (Aug 01, 2006 9:41 AM)                Chief Complaint:  Pt here for med refill on Indocet and has question about Requip.  History of Present Illness: Patient has chronic pain from mutliple medical problem. Need refill on Endocet. Previously given #60 when I was on vacation on 07/03/2006.Patient states she is probably taking 2-3 per day. Has  24 pills left. We counted it in the office. States she just deal with more pain. Doesn't want [place] to feel she overtaking her medication.  Additionally, reports she had been taking clonazepam at night in part for restless leg.Recentyl found out father was on Requip which helped him. Would like to try it.     Hypertension Follow-Up      This is a 65 year old woman who presents for Hypertension follow-up.  The patient denies lightheadedness, urinary frequency, headaches, and fatigue.  The patient denies the following associated symptoms: chest pain and chest pressure.  Compliance with medications (by patient report) has been near 100%.  The patient reports that dietary compliance has been fair.   Changed to Norvasc 5mg  daily at last visit.    Past Surgical History:    Intestinal resection (5times)    Hysterectomy    Appendectomy    Bladder lift      Physical Exam  General:     alert, well-hydrated, appropriate dress, cooperative to examination, good hygiene, and underweight appearing.   Neck:     No deformities, masses, or tenderness noted. Lungs:     Normal respiratory effort, chest expands symmetrically. Lungs are clear to auscultation, no crackles or wheezes. Heart:     Normal rate and regular rhythm. S1 and S2 normal without gallop, murmur, click, rub or other extra sounds. Extremities:     No  clubbing, cyanosis, edema, or deformity noted with normal full range of motion of all joints.   Psych:     Cognition and judgment appear intact. Alert and cooperative with normal attention span and concentration. No apparent delusions, illusions, hallucinations    Impression & Recommendations:  Problem # 1:  SYNDROME, CHRONIC PAIN (ICD-338.4) 1. Discussed with patient that she doesn't have to endure significant pain. Can use Endocet as directed. Based on review of records and last rx written, it appears that 60 tablets will last her 1 month.. 2. Wrote RXfor Endocet 10/325 1 Q6hr as needed #120. I anticipate this will last her 2months. If so next will be for  60 tablet.  Problem # 2:  RESTLESS LEG SYNDROME (ICD-333.94) 1. D/c clonazepam 2. Requip Sample pack given 3.Rx for 1mg  given. 4. Instruction and side effects profile provided  Problem # 3:  HYPERTENSION (ICD-401.9) Elevated. 1.Check BMET 2.Increase norvasc to 10 mg 3.F/u in 29month

## 2010-04-18 NOTE — Letter (Signed)
Summary: [facility] for [facility]fusion/[facility]  [facility]   Imported By: Lanelle Bal 07/27/2009 13:09:53  _____________________________________________________________________  External Attachment:    Type:   Image     Comment:   External Document

## 2010-04-18 NOTE — Assessment & Plan Note (Signed)
Summary: B12...AS.  Nurse Visit   Allergies: 1)  ! Talwin 2)  ! Sulfa 3)  ! * Pradaxa  Medication Administration  Injection # 1:    Medication: Vit B12 1000 mcg    Diagnosis: B12 DEFICIENCY (ICD-266.2)    Route: IM    Site: L deltoid    Exp Date: 12/2011    Lot #: [id number]    Mfr: [employer]ent    Patient tolerated injection without complications    Given by: Merri Ray CMA (AAMA) (February 22, 2010 10:55 AM)  Orders Added: 1)  Vit B12 1000 mcg [J3420]

## 2010-04-18 NOTE — Progress Notes (Signed)
Summary: cough/congestion  Phone Note Call from Patient   Caller: Patient Call For: Kazuko Clemence Summary of Call: coughing up yellow mucus would like zpak [facility] Initial call taken by: Rickard Patience,  April 20, 2009 8:49 AM  Follow-up for Phone Call        The patient c/o cough with yellow mucus. She did finish abx given by TP and says she is better but thinks a Zpak would help knock this out of her.  She wants to make sure she is well for surgery. She is still taking the Mucinex DM. Please advise. Michel Bickers [facility]  April 20, 2009 9:54 AM  Additional Follow-up for Phone Call Additional follow up Details #1::        zpack #1, take as directed. no refills.  will need ov if not improving.   Please contact office for sooner follow up if symptoms do not improve or worsen  Additional Follow-up by: Rubye Oaks NP,  April 20, 2009 10:09 AM    Additional Follow-up for Phone Call Additional follow up Details #2::    pt aware of recs and rx sent.Carron Curie CMA  April 20, 2009 10:17 AM   New/Updated Medications: ZITHROMAX Z-PAK 250 MG TABS (AZITHROMYCIN) as directed Prescriptions: ZITHROMAX Z-PAK 250 MG TABS (AZITHROMYCIN) as directed  #1 pak x 0   Entered by:   Carron Curie CMA   Authorized by:   Rubye Oaks NP   Signed by:   Carron Curie CMA on 04/20/2009   Method used:   Electronically to        [facility]* (retail)       [address]       [place], [place]  [id number]       Ph: [phone]       Fax: [phone]   RxID:   [id number]

## 2010-04-18 NOTE — Progress Notes (Signed)
Summary: [facility]--rx  Phone Note Refill Request   Refills Requested: Medication #1:  TEMAZEPAM 15 MG  CAPS Take one to two capsules at bedtime [facility]--p--[phone] f--[phone]--last filled--1.7.10  Initial call taken by: Freddy Jaksch,  May 18, 2008 2:30 PM  Follow-up for Phone Call        ok to refill x1 Follow-up by: Loreen Freud DO,  May 19, 2008 1:18 PM      Prescriptions: TEMAZEPAM 15 MG  CAPS (TEMAZEPAM) Take one to two capsules at bedtime  #60 x 0   Entered and Authorized by:   Loreen Freud DO   Signed by:   Loreen Freud DO on 05/19/2008   Method used:   Print then Give to Patient   RxID:   [id number]

## 2010-04-18 NOTE — Letter (Signed)
Summary: Encounter Notice/[facility]  Encounter [facility]   Imported By: Lanelle Bal 01/24/2010 13:06:01  _____________________________________________________________________  External Attachment:    Type:   Image     Comment:   External Document

## 2010-04-18 NOTE — Letter (Signed)
Summary: [facility]N for infusion/[facility]  [facility] for [facility]   Imported By: Sherian Rein 01/09/2010 12:20:49  _____________________________________________________________________  External Attachment:    Type:   Image     Comment:   External Document

## 2010-04-18 NOTE — Cardiovascular Report (Signed)
Summary: Pre-Op Orders  Pre-Op Orders   Imported By: Marylou Mccoy 12/28/2009 08:03:57  _____________________________________________________________________  External Attachment:    Type:   Image     Comment:   External Document

## 2010-04-18 NOTE — Letter (Signed)
Summary: Pt's Med list/[facility]  Pt's Med list/[facility]   Imported By: Sherian Rein 11/18/2009 10:37:06  _____________________________________________________________________  External Attachment:    Type:   Image     Comment:   External Document

## 2010-04-18 NOTE — Progress Notes (Signed)
  Phone Note Outgoing Call Call back at [facility] Phone [phone]   Call placed by: Ardyth Man,  February 14, 2007 3:11 PM Call placed to: Patient Summary of Call: Dr. Blossom Hoops, referral already done for Mrs. Yetta Flock ...................................................................Ardyth Man  February 14, 2007 3:12 PM   Follow-up for Phone Call        Thanks Follow-up by: Leanne Chang MD,  February 17, 2007 8:00 AM

## 2010-04-18 NOTE — Assessment & Plan Note (Signed)
Summary: bad cough, lungs hurt/apc   Copy to:  Dr. Laury Axon Primary Gizell Danser/Referring Antion Andres:  Loreen Freud DO  CC:  Pt states she saw TP last week and has been on abx since and but states her cough is no better. [name]  History of Present Illness: 65 yo female complicated WF with known history of  recurrent pneumonia, and COPD.  She was seen on June 11 for an exacerbation and given a course of avelox.  She finished the antibiotics today.  She continues to have cough with some sputum.  She is not sure what the color is.  She has been hoarse.  She has been getting a low grade temp of up to 100F.  She has been getting chest tightness and wheeze.  She denies sinus symptoms, and does not have abdominal complaints.  She has been using her ventolin 3 to 4 times per day.  She continues to use spiriva.  Chest xray shows right lower airspace disease and effusion.  Current Medications (verified): 1)  Endocet 10-325 Mg  Tabs (Oxycodone-Acetaminophen) .... Take One Tablet Every 6-8 Hours As Needed For Pain. 2)  Fentanyl 50 Mcg/hr  Pt72 (Fentanyl) .... Change Patch Every Three Days 3)  Prilosec 40 Mg Cpdr (Omeprazole) .... Take 1 Tablet By Mouth Once A Day 4)  Cardura 4 Mg  Tabs (Doxazosin Mesylate) .... Take One Tablet Daily 5)  Amitriptyline Hcl 25 Mg  Tabs (Amitriptyline Hcl) .... Take 2 Tablets Every Evening At Bedtime As Needed 6)  Mobic 15 Mg Tabs (Meloxicam) .... Take 1/2 - 1 By Mouth Once Daily 7)  Temazepam 15 Mg  Caps (Temazepam) .... Take One To Two Capsules At Bedtime 8)  Cyclobenzaprine Hcl 10 Mg  Tabs (Cyclobenzaprine Hcl) .... Take One Tablet Three Times Daily 9)  Gabapentin 600 Mg  Tabs (Gabapentin) .... Take One Tablet 4 Times Daily 10)  Norvasc 10 Mg  Tabs (Amlodipine Besylate) .[name].. 1 By Mouth Qd 11)  Promethazine Hcl 25 Mg  Tabs (Promethazine Hcl) .... Take One Tablet  Q 4-6 Hrs As Needed For Nausea 12)  Flecainide Acetate 50 Mg  Tabs (Flecainide Acetate) .[name].. 1 Tablet By Mouth Every  Morning and 1/2 Tablet Po Every Night 13)  Spiriva Handihaler 18 Mcg Caps (Tiotropium Bromide Monohydrate) .... One Puff Once Daily 14)  Ventolin Hfa 108 (90 Base) Mcg/act Aers (Albuterol Sulfate) .... Two Puffs Up To Four Times Per Day As Needed  Allergies (verified): 1)  ! Talwin 2)  ! Sulfa  Past History:  Past Medical History: Reviewed history from 07/29/2008 and no changes required. Anemia-iron deficiency Hypertension Osteoarthritis GERD CAD Left bundle branch block Atrial fibrillation - Dr. Roger Shelter Chronic pain Fibromyalgia Diverticulosis, colon COPD/Emphysema      -06/28/08 PFT FEV1 1.95 (97%), FVC 2.79 (101%), FEV1% 70, TLC 3.88 (87%), DLCO 64% Pneumonia      - 03/09, 12/09, 02/10 Exudative pleural effusion s/p right thoracentesis 03/09  Past Surgical History: Reviewed history from 05/12/2008 and no changes required. Intestinal resection (5times) Hysterectomy-complete Appendectomy Bladder lift Tonsillectomy Lysis of intestinal adhesions-numerous surgeries PICC Line TAH  Vital Signs:  Patient profile:   65 year old female Height:      62 inches Weight:      94.13 pounds O2 Sat:      95 % on Room air Temp:     97.5 degrees F oral Pulse rate:   111 / minute BP sitting:   110 / 72  (right arm) Cuff size:  regular  Vitals Entered By: Carron Curie CMA (September 01, 2008 9:52 AM)  O2 Flow:  Room air CC: Pt states she saw TP last week and has been on abx since, but states her cough is no better.  Comments Medications reviewed with patient Carron Curie CMA  September 01, 2008 9:52 AM    Physical Exam  General:  thin.   Ears:  mild cerumen build up Nose:  no deformity, discharge, inflammation, or lesions Mouth:  no deformity or lesions Neck:  no JVD.   Lungs:  coarse breath sounds, prolonged exhalation, no wheezing, decreased breath sounds right base Heart:  regular rhythm, normal rate, and no murmurs.   Abdomen:  thin, soft, no masses,  non-tender, normal bowel sounds Extremities:  no edema, PICC line in left arm Cervical Nodes:  no significant adenopathy   Impression & Recommendations:  Problem # 1:  CHRONIC OBSTRUCTIVE PULMONARY DISEASE, ACUTE EXACERBATION (ICD-491.21) I will give her a course of prednisone, and prolong her course of avelox.  She is to continue her inhaler regimen.  Problem # 2:  PLEURAL EFFUSION, RIGHT (ICD-511.9) I will have her get a CT chest to determine if she has a significant effusion as well as to better characterize her infiltrate.  Depending on the results she may need thoracentesis.  Problem # 3:  PNEUMONIA (ICD-486) She has recurrent pneumonia, and likely has another episode.  Will give her prolonged therapy with avelox.  If she fails to improve she will need to be set up with IV antibiotics.  She may need evaluation by immunology to determine if she has a deficiency which is predisposing her to getting pneumonia.  Medications Added to Medication List This Visit: 1)  Avelox 400 Mg Tabs (Moxifloxacin hcl) .... One by mouth once daily 2)  Prednisone 10 Mg Tabs (Prednisone) .... 3 once daily for two days, 2 once daily for two days, 1 once daily for 2 days  Other Orders: Est. Patient Level IV (14782) Radiology Referral (Radiology) T-2 View CXR, Same Day (71020.5TC) TLB-CBC Platelet - w/Differential (85025-CBCD) TLB-BMP (Basic Metabolic Panel-BMET) (80048-METABOL)  Patient Instructions: 1)  Avelox 400 mg once daily for 7 days 2)  Prednisone 10 mg: 3 pills once daily for 2 days, 2 pills once daily for 2 days, 1 pill once daily for two days 3)  Will schedule CT chest 4)  Lab test today 5)  Follow up in 2 weeks Prescriptions: PREDNISONE 10 MG TABS (PREDNISONE) 3 once daily for two days, 2 once daily for two days, 1 once daily for 2 days  #12 x 0   Entered and Authorized by:   Coralyn Helling MD   Signed by:   Coralyn Helling MD on 09/01/2008   Method used:   Electronically to        [facility] (retail)       [address]       [place], [place]  [id number]       Ph: [phone]       Fax: [phone]   RxID:   [id number] AVELOX 400 MG TABS (MOXIFLOXACIN HCL) one by mouth once daily  #7 x 0   Entered and Authorized by:   Coralyn Helling MD   Signed by:   Coralyn Helling MD on 09/01/2008   Method used:   Electronically to        [facility] (retail)       [address]  [place], [place]  [address]       Ph: [phone]       Fax: [phone]   RxID:   [id number]

## 2010-04-18 NOTE — Progress Notes (Signed)
Summary: TEMAZEPAM REFILL  Phone Note Refill Request Message from:  Fax from Pharmacy on December 19, 2009 11:37 AM  Refills Requested: Medication #1:  TEMAZEPAM 15 MG  CAPS Take one to two capsules at bedtime [address] [place] [phone]   QTY = 60  Initial call taken by: Jerolyn Shin,  December 19, 2009 11:38 AM  Follow-up for Phone Call        last OV 11/15/09 and filled 09/07/09. Please advise Follow-up by: Almeta Monas CMA  Dull),  December 19, 2009 11:59 AM  Additional Follow-up for Phone Call Additional follow up Details #1::        refill x1, 2 refills Additional Follow-up by: Loreen Freud DO,  December 19, 2009 12:04 PM    Additional Follow-up for Phone Call Additional follow up Details #2::    Rx faxed.... Marland KitchenAlmeta Monas CMA  Dull)  December 19, 2009 1:10 PM    Prescriptions: TEMAZEPAM 15 MG  CAPS (TEMAZEPAM) Take one to two capsules at bedtime  #60 x 2   Entered and Authorized by:   Loreen Freud DO   Signed by:   Loreen Freud DO on 12/19/2009   Method used:   Print then Give to Patient   RxID:   [id number]

## 2010-04-18 NOTE — Medication Information (Signed)
Summary: Patient Assistance Form/[facility]  Patient Assistance Form/[facility]   Imported By: Lanelle Bal 11/16/2009 11:51:21  _____________________________________________________________________  External Attachment:    Type:   Image     Comment:   External Document

## 2010-04-18 NOTE — Progress Notes (Signed)
Summary: [facility]---RX  Phone Note Refill Request   Refills Requested: Medication #1:  CELEBREX 200 MG  CAPS Take one tablet twice daily PLEASE NOTE WE OFFER #100 MELOXICAM 15 MG FOR $10.00 IF OKAY . PLEASE WRITE A NEW RX FOR #100 TABS--[facility]--PH-[phone]  Initial call taken by: Freddy Jaksch,  July 13, 2008 2:05 PM  Follow-up for Phone Call        I think pt already tried this---please verify--- Follow-up by: Loreen Freud DO,  July 13, 2008 3:15 PM  Additional Follow-up for Phone Call Additional follow up Details #1::        pt states that she has not tried this med and she is in the donut hole with her insurace and the celebrex is costing to much so she just needs something a little  cheaper now and the pharmacy offer this as a suggestion...............Marland KitchenFelecia Deloach CMA  July 14, 2008 11:51 AM    Additional Follow-up for Phone Call Additional follow up Details #2::    Mobic 15 mg  1/2 - 1 by mouth once daily #30  2 refills Follow-up by: Loreen Freud DO,  July 14, 2008 11:58 AM  New/Updated Medications: MOBIC 15 MG TABS (MELOXICAM) take 1/2 - 1 by mouth once daily   Prescriptions: MOBIC 15 MG TABS (MELOXICAM) take 1/2 - 1 by mouth once daily  #30 x 2   Entered by:   Jeremy Johann CMA   Authorized by:   Loreen Freud DO   Signed by:   Jeremy Johann CMA on 07/14/2008   Method used:   Faxed to ...       [facility] (retail)       [address]       [place], [place]  [id number]       Ph: [phone]       Fax: [phone]   RxID:   [id number]

## 2010-04-18 NOTE — Progress Notes (Signed)
Summary: two written rx for pick up  Phone Note Call from Patient   Caller: Patient Summary of Call: Dr.Lowne   [phone]  pt need a rx for oxycodone and fentanyl patch 50 micrograms. pt will pick rx up. Initial call taken by: Vanessa Swaziland,  June 12, 2007 10:36 AM  Follow-up for Phone Call        Patient aware to pick up rx's up front ...................................................................Ardyth Man  June 12, 2007 10:56 AM  Follow-up by: Ardyth Man,  June 12, 2007 10:56 AM      Prescriptions: FENTANYL 50 MCG/HR  PT72 (FENTANYL) change patch every three days  #10 x 0   Entered by:   Ardyth Man   Authorized by:   Loreen Freud DO   Signed by:   Ardyth Man on 06/12/2007   Method used:   Print then Give to Patient   RxID:   [id number] ENDOCET 10-325 MG  TABS (OXYCODONE-ACETAMINOPHEN) Take one tablet every 6-8 hours as needed for pain.  #120 x 0   Entered by:   Ardyth Man   Authorized by:   Loreen Freud DO   Signed by:   Ardyth Man on 06/12/2007   Method used:   Print then Give to Patient   RxID:   [id number]

## 2010-04-18 NOTE — Letter (Signed)
Summary: Results Follow-up Letter  [place] [facility]  [address] [place], [place] [address]   Phone: [phone]  Fax: [phone]    09/01/2007        Devonne Doughty [address] [place], [place]  [address]  Dear Ms. Mccart,   The following are the results of your recent test(s):  Test     Result     Pap Smear    Normal_______  Not Normal_____       Comments: _________________________________________________________ Cholesterol LDL(Bad cholesterol):          Your goal is less than:         HDL (Good cholesterol):        Your goal is more than: _________________________________________________________ Other Tests:   _________________________________________________________  Please call for an appointment Or __Please see attached._______________________________________________________ _________________________________________________________ _________________________________________________________  Sincerely,  Ardyth Man [place] [facility]

## 2010-04-18 NOTE — Letter (Signed)
[facility]   Imported By: Lester Takotna 04/16/2009 08:38:35  _____________________________________________________________________  External Attachment:    Type:   Image     Comment:   External Document

## 2010-04-18 NOTE — Progress Notes (Signed)
Summary: Needs orders faxed   Phone Note Other Incoming   Caller: [facility] Reason for Call: Get patient information Summary of Call: Did not get back the order for Infusion from 10-3 or the one from 12-28-09. Would like it faxed to [phone]. Initial call taken by: Leanor Kail [facility],  January 05, 2010 8:24 AM  Follow-up for Phone Call        I have faxed these orders to [facility] multiple times now. I have gotten Dr Juanda Chance to sign orders and have again faxed them to [facility]. I have spoken to [facility] who states that the 3 page fax has come over to them. Follow-up by: Lamona Curl CMA Duncan Dull),  January 06, 2010 11:44 AM

## 2010-04-18 NOTE — Assessment & Plan Note (Signed)
Summary: follow up 3 months.cbs   Vital Signs:  Patient profile:   65 year old female Height:      62 inches Weight:      92.0 pounds BMI:     16.89 BSA:     1.38 Pulse rate:   100 / minute Resp:     17 per minute BP sitting:   98 / 60  (right arm) Cuff size:   regular  Vitals Entered By: Ardyth Man (June 03, 2008 11:22 AM)    History of Present Illness:  Hypertension follow-up      This is a 65 year old woman who presents for Hypertension follow-up.  The patient denies lightheadedness, urinary frequency, headaches, edema, impotence, rash, and fatigue.  The patient denies the following associated symptoms: chest pain, chest pressure, exercise intolerance, dyspnea, palpitations, syncope, leg edema, and pedal edema.  Compliance with medications (by patient report) has been near 100%.  The patient reports that dietary compliance has been good.  Adjunctive measures currently used by the patient include salt restriction.    Problems Prior to Update: 1)  Gastritis  (ICD-535.50) 2)  Pneumonia, Right Middle Lobe  (ICD-486) 3)  Acute Bronchitis  (ICD-466.0) 4)  Pneumonia  (ICD-486) 5)  Pneumonia, Left Lower Lobe  (ICD-481) 6)  Chest Pain Unspecified  (ICD-786.50) 7)  Atrial Fibrillation  (ICD-427.31) 8)  Unspecified Bacterial Pneumonia  (ICD-482.9) 9)  Coronary Artery Disease  (ICD-414.00) 10)  Gastroparesis  (ICD-536.3) 11)  Tachycardia, Hx of  (ICD-V12.50) 12)  Nausea, Chronic  (ICD-787.02) 13)  Abdominal Pain, Chronic  (ICD-789.00) 14)  Chronic Pain Syndrome  (ICD-338.4) 15)  Syncope  (ICD-780.2) 16)  Osteoporosis  (ICD-733.00) 17)  Uti  (ICD-599.0) 18)  Fracture, Humerus, Proximal  (ICD-812.00) 19)  Hypertension, Benign Essential  (ICD-401.1) 20)  Syndrome, Chronic Pain  (ICD-338.4) 21)  Restless Leg Syndrome  (ICD-333.94) 22)  Urinary Incontinence  (ICD-788.30) 23)  Chronic Fatigue Syndrome  (ICD-780.71) 24)  Bowel Obstruction  (ICD-560.9) 25)  Fibromyalgia   (ICD-729.1) 26)  Degenerative Disc Disease  (ICD-722.6) 27)  Osteoarthritis  (ICD-715.90) 28)  Hypertension  (ICD-401.9) 29)  Anemia-iron Deficiency  (ICD-280.9)  Medications Prior to Update: 1)  Endocet 10-325 Mg  Tabs (Oxycodone-Acetaminophen) .... Take One Tablet Every 6-8 Hours As Needed For Pain. 2)  Fentanyl 50 Mcg/hr  Pt72 (Fentanyl) .... Change Patch Every Three Days 3)  Prilosec 40 Mg Cpdr (Omeprazole) .... Take 1 Tablet By Mouth Once A Day 4)  Cardura 4 Mg  Tabs (Doxazosin Mesylate) .... Take One Tablet Daily 5)  Enablex 7.5 Mg  Tb24 (Darifenacin Hydrobromide) .... Take Daily As Directed 6)  Amitriptyline Hcl 25 Mg  Tabs (Amitriptyline Hcl) .... Take 2 Tablets Every Evening At Bedtime 7)  Celebrex 200 Mg  Caps (Celecoxib) .... Take One Tablet Twice Daily 8)  Temazepam 15 Mg  Caps (Temazepam) .... Take One To Two Capsules At Bedtime 9)  Cyclobenzaprine Hcl 10 Mg  Tabs (Cyclobenzaprine Hcl) .... Take One Tablet Three Times Daily 10)  Gabapentin 600 Mg  Tabs (Gabapentin) .... Take One Tablet 4 Times Daily 11)  Norvasc 10 Mg  Tabs (Amlodipine Besylate) .[name].. 1 By Mouth Qd 12)  Promethazine Hcl 25 Mg  Tabs (Promethazine Hcl) .... Take One Tablet  Q 4-6 Hrs As Needed For Nausea 13)  Flecainide Acetate 50 Mg  Tabs (Flecainide Acetate) .[name].. 1 Tablet By Mouth Every Morning and 1/2 Tablet Po Every Night 14)  Vitamin D 88416 Unit Caps (Ergocalciferol) .Marland KitchenMarland KitchenMarland Kitchen  Take One Tablet Weekly  Current Medications (verified): 1)  Endocet 10-325 Mg  Tabs (Oxycodone-Acetaminophen) .... Take One Tablet Every 6-8 Hours As Needed For Pain. 2)  Fentanyl 50 Mcg/hr  Pt72 (Fentanyl) .... Change Patch Every Three Days 3)  Prilosec 40 Mg Cpdr (Omeprazole) .... Take 1 Tablet By Mouth Once A Day 4)  Cardura 4 Mg  Tabs (Doxazosin Mesylate) .... Take One Tablet Daily 5)  Enablex 7.5 Mg  Tb24 (Darifenacin Hydrobromide) .... Take Daily As Directed 6)  Amitriptyline Hcl 25 Mg  Tabs (Amitriptyline Hcl) .... Take 2 Tablets  Every Evening At Bedtime 7)  Celebrex 200 Mg  Caps (Celecoxib) .... Take One Tablet Twice Daily 8)  Temazepam 15 Mg  Caps (Temazepam) .... Take One To Two Capsules At Bedtime 9)  Cyclobenzaprine Hcl 10 Mg  Tabs (Cyclobenzaprine Hcl) .... Take One Tablet Three Times Daily 10)  Gabapentin 600 Mg  Tabs (Gabapentin) .... Take One Tablet 4 Times Daily 11)  Norvasc 10 Mg  Tabs (Amlodipine Besylate) .[name].. 1 By Mouth Qd 12)  Promethazine Hcl 25 Mg  Tabs (Promethazine Hcl) .... Take One Tablet  Q 4-6 Hrs As Needed For Nausea 13)  Flecainide Acetate 50 Mg  Tabs (Flecainide Acetate) .[name].. 1 Tablet By Mouth Every Morning and 1/2 Tablet Po Every Night 14)  Vitamin D [address] Unit Caps (Ergocalciferol) .... Take One Tablet Weekly  Allergies (verified): 1)  ! Talwin 2)  ! Sulfa  Past History:  Family History:    Family History of Heart Disease: Father    Family History of CVA: Mother    No FH of Colon Cancer:     (05/12/2008)  Social History:    Occupation: Disabled    Alcohol Use - no    Illicit Drug Use - no    Patient is a former smoker.     Daily Caffeine Use     (05/12/2008)  Risk Factors:    Alcohol Use: N/A    >5 drinks/d w/in last 3 months: N/A    Caffeine Use: 1 (10/09/2007)    Diet: N/A    Exercise: N/A  Risk Factors:    Smoking Status: quit (10/09/2007)    Packs/Day: N/A    Cigars/wk: N/A    Pipe Use/wk: N/A    Cans of tobacco/wk: N/A    Passive Smoke Exposure: N/A  Past medical, surgical, family and social histories (including risk factors) reviewed, and no changes noted (except as noted below).  Past Medical History:    Anemia-iron deficiency    Hypertension    Osteoarthritis    GERD    CAD    Left bundle branch block    Atrial fibrillation - Dr. Roger Shelter    Chronic pain    Fibromyalgia    Diverticulosis, colon  Past Surgical History:    Reviewed history from 05/12/2008 and no changes required:    Intestinal resection (5times)    Hysterectomy-complete     Appendectomy    Bladder lift    Tonsillectomy    Lysis of intestinal adhesions-numerous surgeries    PICC Line    TAH  Family History:    Reviewed history from 05/12/2008 and no changes required:       Family History of Heart Disease: Father       Family History of CVA: Mother       No FH of Colon Cancer:         Social History:    Reviewed history from 05/12/2008  and no changes required:       Occupation: Disabled       Alcohol Use - no       Illicit Drug Use - no       Patient is a former smoker.        Daily Caffeine Use         Review of Systems      See HPI  Physical Exam  General:  Well-developed,well-nourished,in no acute distress; alert,appropriate and cooperative throughout examination Lungs:  Normal respiratory effort, chest expands symmetrically. Lungs are clear to auscultation, no crackles or wheezes. Heart:  normal rate and regular rhythm.   Extremities:  No clubbing, cyanosis, edema, or deformity noted with normal full range of motion of all joints.   Psych:  Oriented X3, memory intact for recent and remote, and normally interactive.     Impression & Recommendations:  Problem # 1:  HYPERTENSION, BENIGN ESSENTIAL (ICD-401.1)  Her updated medication list for this problem includes:    Cardura 4 Mg Tabs (Doxazosin mesylate) .[name]... Take one tablet daily    Norvasc 10 Mg Tabs (Amlodipine besylate) .[name]... 1 by mouth qd  BP today: 98/60 Prior BP: 100/58 (05/12/2008)  Labs Reviewed: Creat: 0.8 (05/12/2008) Chol: 160 (12/09/2007)   HDL: 44.4 (12/09/2007)   LDL: 100 (12/09/2007)   TG: 76 (12/09/2007)  Problem # 2:  OSTEOPOROSIS (ICD-733.00)  Her updated medication list for this problem includes:    Vitamin D [address] Unit Caps (Ergocalciferol) .[name]... Take one tablet weekly  Problem # 3:  ATRIAL FIBRILLATION (ICD-427.31)  Her updated medication list for this problem includes:    Flecainide Acetate 50 Mg Tabs (Flecainide acetate) .[name]... 1 tablet by mouth every  morning and 1/2 tablet po every night  Complete Medication List: 1)  Endocet 10-325 Mg Tabs (Oxycodone-acetaminophen) .... Take one tablet every 6-8 hours as needed for pain. 2)  Fentanyl 50 Mcg/hr Pt72 (Fentanyl) .... Change patch every three days 3)  Prilosec 40 Mg Cpdr (Omeprazole) .... Take 1 tablet by mouth once a day 4)  Cardura 4 Mg Tabs (Doxazosin mesylate) .... Take one tablet daily 5)  Enablex 7.5 Mg Tb24 (Darifenacin hydrobromide) .... Take daily as directed 6)  Amitriptyline Hcl 25 Mg Tabs (Amitriptyline hcl) .... Take 2 tablets every evening at bedtime 7)  Celebrex 200 Mg Caps (Celecoxib) .... Take one tablet twice daily 8)  Temazepam 15 Mg Caps (Temazepam) .... Take one to two capsules at bedtime 9)  Cyclobenzaprine Hcl 10 Mg Tabs (Cyclobenzaprine hcl) .... Take one tablet three times daily 10)  Gabapentin 600 Mg Tabs (Gabapentin) .... Take one tablet 4 times daily 11)  Norvasc 10 Mg Tabs (Amlodipine besylate) .[name].. 1 by mouth qd 12)  Promethazine Hcl 25 Mg Tabs (Promethazine hcl) .... Take one tablet  q 4-6 hrs as needed for nausea 13)  Flecainide Acetate 50 Mg Tabs (Flecainide acetate) .[name].. 1 tablet by mouth every morning and 1/2 tablet po every night 14)  Vitamin D 60454 Unit Caps (Ergocalciferol) .... Take one tablet weekly Prescriptions: ENDOCET 10-325 MG  TABS (OXYCODONE-ACETAMINOPHEN) Take one tablet every 6-8 hours as needed for pain.  #120 x 0   Entered and Authorized by:   Loreen Freud DO   Signed by:   Loreen Freud DO on 06/03/2008   Method used:   Print then Give to Patient   RxID:   [id number]      Flexible Sigmoidoscopy  Procedure date:  06/02/2008  Findings:  Results: Diverticulosis.   Comments:      Location: [facility].   UGI  Procedure date:  06/02/2008  Findings:      gastritis

## 2010-04-18 NOTE — Assessment & Plan Note (Signed)
Summary: ONE-MONTH B12 SHOT...LSW.  Nurse Visit   Allergies: 1)  ! Talwin 2)  ! Sulfa  Medication Administration  Injection # 1:    Medication: Vit B12 1000 mcg    Diagnosis: B12 DEFICIENCY (ICD-266.2)    Route: IM    Site: R deltoid    Exp Date: 07/18/2010    Lot #: [id number]    Mfr: [employer]    Patient tolerated injection without complications    Given by: Harlow Mares CMA (AAMA) (December 16, 2008 10:57 AM)  Orders Added: 1)  Vit B12 1000 mcg [J3420]

## 2010-04-18 NOTE — Progress Notes (Signed)
Summary: Port-A-Cath Orders   ---- Converted from flag ---- ---- 12/19/2009 1:01 PM, Hart Carwin MD wrote: Karen Kitchens, pt wants to have her IV fluids reordered via Portacath which was placed last week. The PICC line has been removed, so they need new orders, weekly 1000cc NaCl with KCl, I told her Dr Dwain Sarna should arrange for the Porta-cath to be flushed once a month. ------------------------------ I have spoken to Dr Doreen Salvage office (patient says Dr Dwain Sarna refused to order monthly flushes). Dr Doreen Salvage nurse states that Dr Dwain Sarna would like whoever managed patient's picc line to also manage portacath flushes as he has not really been "involved in the patient's care, only the insertion of the portacath." I have spoken to Dr Juanda Chance who states that it is fine to go forward with the original orders plus orders for monthly portacath flush. I have spoken to [facility] to give verbal orders including: 1000 cc sodium chloride with 10 mEq potassium chloride once weekly via portacath as well as portacath flush once per month using 500 units/5 cc heprin flush. Dottie Nelson-Smith CMA Duncan Dull)  December 19, 2009 4:23 PM

## 2010-04-18 NOTE — Letter (Signed)
Summary: [facility]  [facility]   Imported By: Esmeralda Links D'jimraou 08/27/2007 14:59:11  _____________________________________________________________________  External Attachment:    Type:   Image     Comment:   External Document

## 2010-04-18 NOTE — Assessment & Plan Note (Signed)
Summary: 2 weeks/apc   Visit Type:  Follow-up Copy to:  [facility] Primary Janki Dike/Referring [place] Kosch:  Loreen Freud, DO  CC:  COPD.  The patient c/o occasional SOB and prod cough...pt is having a heart cath later today.  History of Present Illness: 65  yo female with history of COPD and recurrent pneumonia.  She wsa in the hospital in [place] during the beginning of the month.  She had PNA, bacteremia from PICC line, UTI, and found to have EF 20%.  Her breathing is improved.  She has occasional dry cough.  She is not using her ventolin much.   CXR  Procedure date:  11/16/2009  Findings:       Clinical Data: Pneumonia.  COPD.  Former smoker.  Hypertension.   Malabsorption syndrome.    CHEST - 2 VIEW    Comparison: 05/01/2009    Findings: Left arm PICC line is seen in appropriate position.  Both   lungs are clear.  No evidence of pleural effusion.  Heart size and   mediastinal contours are within normal limits.    IMPRESSION:   No active disease.   Current Medications (verified): 1)  Spiriva Handihaler 18 Mcg Caps (Tiotropium Bromide Monohydrate) .... One Puff Once Daily 2)  Ventolin Hfa 108 (90 Base) Mcg/act Aers (Albuterol Sulfate) .... Two Puffs Up To Four Times Per Day As Needed 3)  Endocet 10-325 Mg  Tabs (Oxycodone-Acetaminophen) .... Take One Tablet Every 6-8 Hours As Needed For Pain. 4)  Fentanyl 50 Mcg/hr  Pt72 (Fentanyl) .... Change Patch Every Three Days 5)  Mobic 15 Mg Tabs (Meloxicam) .... Hold 6)  Pantoprazole Sodium 40 Mg Tbec (Pantoprazole Sodium) .... Take 1 Tablet By Mouth Once A Day 7)  Temazepam 15 Mg  Caps (Temazepam) .... Take One To Two Capsules At Bedtime 8)  Cyclobenzaprine Hcl 10 Mg  Tabs (Cyclobenzaprine Hcl) .... Take One Tablet Three Times Daily 9)  Gabapentin 600 Mg  Tabs (Gabapentin) .... Take One Tablet 4 Times Daily 10)  Zofran 8 Mg Tabs (Ondansetron Hcl) .[name].. 1 By Mouth Q12 H As Needed 11)  Foradil Aerolizer 12 Mcg Caps  (Formoterol Fumarate) .... One Puff Two Times A Day 12)  Resource Breeze  Liqd (Nutritional Supplements) .... Take 1 Can (250 Calories/can) Two Times A Day Per Peg Tube. Dx: 536.3-Gastroparesis, 787.01-Nausea and Vomiting 13)  Reglan 5 Mg Tabs (Metoclopramide Hcl) .... Take 1 Tablet By Mouth Three Times A Day Before Meals 14)  Aspirin 81 Mg Tbec (Aspirin) .[name].. 1 By Mouth Qd 15)  Furosemide 20 Mg Tabs (Furosemide) .[name].. 1 By Mouth Qd 16)  Nitrostat 0.4 Mg Subl (Nitroglycerin) .... By Mouth 17)  Amiodarone Hcl 200 Mg Tabs (Amiodarone Hcl) .[name].. 1 By Mouth Daily 18)  Carvedilol 6.25 Mg Tabs (Carvedilol) .... 2 By Mouth Daily 19)  Metroclopramide 5 Mg .... By Mouth Three Times A Day  Allergies (verified): 1)  ! Talwin 2)  ! Sulfa  Review of Systems       The patient complains of non-productive cough, acid heartburn, and indigestion.  The patient denies shortness of breath with activity, shortness of breath at rest, productive cough, coughing up blood, chest pain, weight change, difficulty swallowing, sore throat, headaches, nasal congestion/difficulty breathing through nose, sneezing, hand/feet swelling, joint stiffness or pain, rash, change in color of mucus, and fever.    Vital Signs:  Patient profile:   65 year old female Height:      62 inches (157.48 cm) Weight:  95.13 pounds (43.24 kg) BMI:     17.46 O2 Sat:      93 % on Room air Temp:     98.5 degrees F (36.94 degrees C) oral Pulse rate:   74 / minute BP sitting:   110 / 62  (right arm) Cuff size:   regular  Vitals Entered By: Michel Bickers CMA (November 16, 2009 9:09 AM)  O2 Sat at Rest %:  93 O2 Flow:  Room air CC: COPD.  The patient c/o occasional SOB and prod cough...pt is having a heart cath later today Comments Medications reviewed with the patient. Daytime phone verified. Michel Bickers CMA  November 16, 2009 9:10 AM   Physical Exam  General:  thin.   Nose:  no deformity, discharge, inflammation, or lesions Mouth:  no  deformity or lesions Neck:  healing surgical scar on left neck Lungs:  decreased breath sounds, no wheezing or rales, no dullness Heart:  regular rhythm, normal rate, and no murmurs.   Abdomen:  thin, soft, no masses, non-tender, normal bowel sounds Extremities:  no edema, PICC line in left arm Cervical Nodes:  no significant adenopathy   Impression & Recommendations:  Problem # 1:  COPD (ICD-496) This is stable.  She is to continue her current inhaler regimen.  Problem # 2:  PNEUMONIA, RIGHT LOWER LOBE (ICD-486)  Resolved on chest xray.  Problem # 3:  CORONARY ARTERY DISEASE (ICD-414.00) She was found to have systolic dysfunction during her recent hospitalization.  She is to have heart catheterization and be evaluated for AICD with cardiology.  Medications Added to Medication List This Visit: 1)  Mobic 15 Mg Tabs (Meloxicam) .... Hold  Complete Medication List: 1)  Foradil Aerolizer 12 Mcg Caps (Formoterol fumarate) .... One puff two times a day 2)  Spiriva Handihaler 18 Mcg Caps (Tiotropium bromide monohydrate) .... One puff once daily 3)  Ventolin Hfa 108 (90 Base) Mcg/act Aers (Albuterol sulfate) .... Two puffs up to four times per day as needed 4)  Endocet 10-325 Mg Tabs (Oxycodone-acetaminophen) .... Take one tablet every 6-8 hours as needed for pain. 5)  Fentanyl 50 Mcg/hr Pt72 (Fentanyl) .... Change patch every three days 6)  Mobic 15 Mg Tabs (Meloxicam) .... Hold 7)  Pantoprazole Sodium 40 Mg Tbec (Pantoprazole sodium) .... Take 1 tablet by mouth once a day 8)  Temazepam 15 Mg Caps (Temazepam) .... Take one to two capsules at bedtime 9)  Cyclobenzaprine Hcl 10 Mg Tabs (Cyclobenzaprine hcl) .... Take one tablet three times daily 10)  Gabapentin 600 Mg Tabs (Gabapentin) .... Take one tablet 4 times daily 11)  Zofran 8 Mg Tabs (Ondansetron hcl) .[name].. 1 by mouth q12 h as needed 12)  Resource Breeze Liqd (Nutritional supplements) .... Take 1 can (250 calories/can) two times a  day per peg tube. dx: 536.3-gastroparesis, 787.01-nausea and vomiting 13)  Reglan 5 Mg Tabs (Metoclopramide hcl) .... Take 1 tablet by mouth three times a day before meals 14)  Aspirin 81 Mg Tbec (Aspirin) .[name].. 1 by mouth qd 15)  Furosemide 20 Mg Tabs (Furosemide) .[name].. 1 by mouth qd 16)  Nitrostat 0.4 Mg Subl (Nitroglycerin) .... By mouth 17)  Amiodarone Hcl 200 Mg Tabs (Amiodarone hcl) .[name].. 1 by mouth daily 18)  Carvedilol 6.25 Mg Tabs (Carvedilol) .... 2 by mouth daily 19)  Metroclopramide 5 Mg  .... By mouth three times a day  Other Orders: Est. Patient Level III (19147)  Patient Instructions: 1)  Follow up in 6 months

## 2010-04-18 NOTE — Progress Notes (Signed)
Summary: GABAPENTIN REFILL  Phone Note Refill Request Message from:  Fax from Pharmacy on December 19, 2009 4:28 PM  Refills Requested: Medication #1:  GABAPENTIN 600 MG  TABS Take one tablet 4 times daily [address] , [place]    FAX [phone]  QTY =120  Initial call taken by: Jerolyn Shin,  December 19, 2009 4:28 PM  Follow-up for Phone Call        last ov 11/15/09 last filled 09/27/09 please advise Follow-up by: Almeta Monas CMA Duncan Dull),  December 19, 2009 4:49 PM  Additional Follow-up for Phone Call Additional follow up Details #1::        ok to refill x1   5 refills Additional Follow-up by: Loreen Freud DO,  December 20, 2009 11:03 AM    Prescriptions: GABAPENTIN 600 MG  TABS (GABAPENTIN) Take one tablet 4 times daily  #120 x 5   Entered by:   Doristine Devoid CMA   Authorized by:   Loreen Freud DO   Signed by:   Doristine Devoid CMA on 12/20/2009   Method used:   Electronically to        [facility] (retail)       [address]       [place], [place]  [address]       Ph: [phone]       Fax: [phone]   RxID:   [id number]

## 2010-04-18 NOTE — Progress Notes (Signed)
Summary: REFILL FOR OXYCODONE & FENTANYL  Phone Note Call from Patient Call back at Home Phone [phone]   Caller: Patient Call For: Loreen Freud DO Reason for Call: Refill Medication Summary of Call: PATIENT CALLING, REQUESTS 2 RX BE LEFT AT FRONT DESK FOR HER TO PICK-UP....Marland KitchenFOR OXYCODONE & FENTANYL PAIN PATCHES, LAST OV 02-15-2009.  Pt has enough to last her until thursday both were filled on 02/15/09 Initial call taken by: Magdalen Spatz [facility],  March 15, 2009 9:59 AM  Follow-up for Phone Call        ok to fill both.  its always ok to refill for her as long as it is no more than 7  days early. Follow-up by: Loreen Freud DO,  March 16, 2009 1:52 PM  Additional Follow-up for Phone Call Additional follow up Details #1::        pt is aware.  Additional Follow-up by: Army Fossa CMA,  March 16, 2009 1:58 PM    Prescriptions: ENDOCET 10-325 MG  TABS (OXYCODONE-ACETAMINOPHEN) Take one tablet every 6-8 hours as needed for pain.  #120 x 0   Entered by:   Army Fossa CMA   Authorized by:   Marga Melnick MD   Signed by:   Army Fossa CMA on 03/16/2009   Method used:   Print then Give to Patient   RxID:   [id number] FENTANYL 50 MCG/HR  PT72 (FENTANYL) change patch every three days  #10 x 0   Entered by:   Army Fossa CMA   Authorized by:   Marga Melnick MD   Signed by:   Army Fossa CMA on 03/16/2009   Method used:   Print then Give to Patient   RxID:   [id number]

## 2010-04-18 NOTE — Progress Notes (Signed)
Summary: [place]--refill fentanyl and endocet  Phone Note Call from Patient Call back at Home Phone [phone]   Caller: Patient Reason for Call: Refill Medication Summary of Call: prescript for  Endocet 10-325, fentanyl 50 mg call when ready. Initial call taken by: Freddy Jaksch,  October 24, 2007 9:25 AM  Follow-up for Phone Call        ok x 1 per Dr. Laury Axon  Additional Follow-up for Phone Call Additional follow up Details #1::        Spoke with pt informed rx ready for pickup.Kandice Hams  October 24, 2007 11:16 AM  Additional Follow-up by: Kandice Hams,  October 24, 2007 11:16 AM      Prescriptions: FENTANYL 50 MCG/HR  PT72 (FENTANYL) change patch every three days  #10 x 0   Entered by:   Ardyth Man   Authorized by:   Loreen Freud DO   Signed by:   Ardyth Man on 10/24/2007   Method used:   Print then Give to Patient   RxID:   [id number] ENDOCET 10-325 MG  TABS (OXYCODONE-ACETAMINOPHEN) Take one tablet every 6-8 hours as needed for pain.  #120 x 0   Entered by:   Ardyth Man   Authorized by:   Loreen Freud DO   Signed by:   Ardyth Man on 10/24/2007   Method used:   Print then Give to Patient   RxID:   [id number]  okay per Dr. Laury Axon

## 2010-04-18 NOTE — Assessment & Plan Note (Signed)
Summary: B12 SHOT..AM  Nurse Visit   Allergies: 1)  ! Talwin 2)  ! Sulfa  Medication Administration  Injection # 1:    Medication: Vit B12 1000 mcg    Diagnosis: B12 DEFICIENCY (ICD-266.2)    Route: IM    Site: R deltoid    Exp Date: 01/2011    Lot #: [id number]    Mfr: American Regent    Comments: pt to schedule next monthly at front desk    Patient tolerated injection without complications    Given by: Chales Abrahams CMA Duncan Dull) (April 11, 2009 11:05 AM)  Orders Added: 1)  Vit B12 1000 mcg [J3420]

## 2010-04-18 NOTE — Miscellaneous (Signed)
Summary: Orders Update pft charges  Clinical Lists Changes  Orders: Added new Service order of Carbon Monoxide diffusing w/capacity (94720) - Signed Added new Service order of Lung Volumes (94240) - Signed Added new Service order of Spirometry (Pre & Post) (94060) - Signed 

## 2010-04-18 NOTE — Progress Notes (Signed)
Summary: temazepam  Phone Note Refill Request Message from:  Pharmacy [facility] fax [phone]  Refills Requested: Medication #1:  TEMAZEPAM 15 MG  CAPS Take one to two capsules at bedtime Initial call taken by: Barb Merino,  November 11, 2008 3:02 PM  Follow-up for Phone Call        LAST REFILL #60 10-05-08, LAST OV 06/03/08.Felecia Deloach CMA  November 12, 2008 12:38 PM  Additional Follow-up for Phone Call Additional follow up Details #1::        #30 Additional Follow-up by: Marga Melnick MD,  November 12, 2008 2:22 PM    Prescriptions: TEMAZEPAM 15 MG  CAPS (TEMAZEPAM) Take one to two capsules at bedtime  #30 x 0   Entered by:   Doristine Devoid   Authorized by:   Marga Melnick MD   Signed by:   Doristine Devoid on 11/12/2008   Method used:   Telephoned to ...       [facility] (retail)       [address]       [place], [place]  [address]       Ph: [phone]       Fax: [phone]   RxID:   [id number] TEMAZEPAM 15 MG  CAPS (TEMAZEPAM) Take one to two capsules at bedtime  #60 x 0   Entered by:   Doristine Devoid   Authorized by:   Marga Melnick MD   Signed by:   Doristine Devoid on 11/12/2008   Method used:   Telephoned to ...       [facility] (retail)       [address]       [place], [place]  [address]       Ph: [phone]       Fax: [phone]   RxID:   [id number]

## 2010-04-18 NOTE — Assessment & Plan Note (Signed)
Summary: B12.Marland KitchenAM.  Nurse Visit   Allergies: 1)  ! Talwin 2)  ! Sulfa  Medication Administration  Injection # 1:    Medication: Vit B12 1000 mcg    Diagnosis: ANEMIA-IRON DEFICIENCY (ICD-280.9)    Route: IM    Site: R deltoid    Exp Date: 4/12    Lot #: [id number]    Mfr: American Regent    Patient tolerated injection without complications    Given by: Hortense Ramal CMA (AAMA) (October 12, 2008 11:19 AM)

## 2010-04-18 NOTE — Assessment & Plan Note (Signed)
Summary: VOMITING X 3 MONTHES      (DR.BRODIE PT.)     [name]    History of Present Illness Visit Type: Follow-up Visit Primary GI MD: [name] MD Primary Provider: Loreen Freud, DO Chief Complaint: vomiting x 3 months, weight loss, occasional diarrhea History of Present Illness:   65 YO  FEMALE KNOWN TO DR BRODIE WITH HX OF CHRONIC LOW GRADE SMALL BOWEL OBSTRUCTION SECONDARY TO ADHESIVE DISEASE. SHE HAS A COMPONENT OF MALABSORPTION,FE DEFICIENCY ANEMIA FOR WHICH SHE SEES DR ENNEVER.SHE IS CURRENTLY RECEIVING ONE LITER OF FLUIDS NIGHTLY AT HOME VIA PICC. SHE COMES IN TODAY WITH C/O INCREASED SXS OVER THE PAST FEW MONTHS,BAD WEEK LAST WEEK. SHE SAYS SHE HAS BEEN HAVING 3-4 GOOD DAYS WERE SHE IS NOT NAUSEATED AND IS ABLE TO EAT,FOLLOWED BY 3-4 BAD DAYS WITH NAUSEA/VOMITING,DECREASED BMS.SHE DENIES ANY PAIN CURRENTLY BUT WILL OCCASIONALLY GET A BOUT OF PAIN.NO FEVER/CHILLS. SHE HAS DISTENTION AT TIMES-NONE RECENTLY.HER LOWEST WEIGHT WAS 86 POUNDS RECENTLY,WEIGHS 89 TODAY,HAS NOT BEEN OVER 95 IN PAST FEW YEARS   GI Review of Systems    Reports bloating, loss of appetite, nausea, vomiting, and  weight loss.      Denies abdominal pain, acid reflux, belching, chest pain, dysphagia with liquids, dysphagia with solids, heartburn, and  vomiting blood.        Denies anal fissure, black tarry stools, change in bowel habit, constipation, diarrhea, diverticulosis, fecal incontinence, heme positive stool, hemorrhoids, irritable bowel syndrome, jaundice, light color stool, liver problems, rectal bleeding, and  rectal pain.    Current Medications (verified): 1)  Spiriva Handihaler 18 Mcg Caps (Tiotropium Bromide Monohydrate) .... One Puff Once Daily 2)  Ventolin Hfa 108 (90 Base) Mcg/act Aers (Albuterol Sulfate) .... Two Puffs Up To Four Times Per Day As Needed 3)  Endocet 10-325 Mg  Tabs (Oxycodone-Acetaminophen) .... Take One Tablet Every 6-8 Hours As Needed For Pain. 4)  Fentanyl 50 Mcg/hr  Pt72  (Fentanyl) .... Change Patch Every Three Days 5)  Mobic 15 Mg Tabs (Meloxicam) .... Take 1/2 - 1 By Mouth Once Daily 6)  Prilosec 40 Mg Cpdr (Omeprazole) .... Take 1 Tablet By Mouth Once A Day 7)  Norvasc 10 Mg  Tabs (Amlodipine Besylate) .[name].. 1 By Mouth Qd 8)  Flecainide Acetate 50 Mg  Tabs (Flecainide Acetate) .[name].. 1 Tablet By Mouth Every Morning and 1/2 Tablet Po Every Night 9)  Temazepam 15 Mg  Caps (Temazepam) .... Take One To Two Capsules At Bedtime 10)  Cyclobenzaprine Hcl 10 Mg  Tabs (Cyclobenzaprine Hcl) .... Take One Tablet Three Times Daily 11)  Gabapentin 600 Mg  Tabs (Gabapentin) .... Take One Tablet 4 Times Daily 12)  Promethazine-Codeine 6.25-10 Mg/39ml Syrp (Promethazine-Codeine) .... 5 Ml By Mouth Every 4-6 Hours As Needed,  13)  Zofran 8 Mg Tabs (Ondansetron Hcl) .[name].. 1 By Mouth Q12 H As Needed  Allergies (verified): 1)  ! Talwin 2)  ! Sulfa  Past History:  Past Medical History: Anemia-iron deficiency CHRONIC PARTIAL SMALL BOWEL OBSTRUCTION Hypertension Osteoarthritis GERD CAD Left bundle branch block Atrial fibrillation - Dr. Roger Shelter Chronic pain Fibromyalgia Diverticulosis, colon COPD/Emphysema      -06/28/08 PFT FEV1 1.95 (97%), FVC 2.79 (101%), FEV1% 70, TLC 3.88 (87%), DLCO 64% Pneumonia      - 03/09, 12/09, 02/10, 06/10 Exudative pleural effusion s/p right thoracentesis 03/09  Past Surgical History: Intestinal resection (5times),S/P PARTIAL COLECTOMY 2001 Hysterectomy-complete Appendectomy Bladder lift Tonsillectomy Lysis of intestinal adhesions-numerous surgeries/LAST 2003 PICC Line TAH  Family History: Reviewed history from 05/12/2008 and no changes required. Family History of Heart Disease: Father Family History of CVA: Mother No FH of Colon Cancer:  Social History: Reviewed history from 05/12/2008 and no changes required. Occupation: Disabled Alcohol Use - no Illicit Drug Use - no Patient is a former smoker.  Daily  Caffeine Use  Review of Systems       The patient complains of fatigue, itching, and shortness of breath.  The patient denies allergy/sinus, anemia, anxiety-new, arthritis/joint pain, back pain, blood in urine, breast changes/lumps, change in vision, confusion, cough, coughing up blood, depression-new, fainting, fever, headaches-new, hearing problems, heart murmur, heart rhythm changes, muscle pains/cramps, night sweats, nosebleeds, skin rash, sleeping problems, sore throat, swelling of feet/legs, swollen lymph glands, thirst - excessive, urination - excessive, urination changes/pain, urine leakage, vision changes, and voice change.         ROS OTHERWISE AS IN HPI  Vital Signs:  Patient profile:   65 year old female Height:      62 inches Weight:      89 pounds BMI:     16.34 Pulse rate:   100 / minute Pulse rhythm:   regular BP sitting:   104 / 58  (right arm)  Vitals Entered By: Milford Cage NCMA (February 17, 2009 9:06 AM)  Physical Exam  General:  Well developed, well nourished, no acute distress.,VERY THIN Head:  Normocephalic and atraumatic. Eyes:  PERRLA, no icterus. Lungs:  decreased BS bilateral.  ,KYPHOTIC Heart:  Regular rate and rhythm; no murmurs, rubs,  or bruits. Abdomen:  MINIMALLY DISTENDED,BS HYPERACTIVE,MULTIPLE INCISIONAL SCARS AND PALPABLE MESH,VS SUTURE,TENDER LMQ ,NO GUARDING OR REBOUND Rectal:  NOT DONE Extremities:  No clubbing, cyanosis, edema or deformities noted. Neurologic:  Alert and  oriented x4;  grossly normal neurologically. Psych:  Alert and cooperative. Normal mood and affect.   Impression & Recommendations:  Problem # 1:  BOWEL OBSTRUCTION (ICD-560.9) Assessment Deteriorated 65 YO FEMALE  WITH CHRONIC PARTIAL SMALL BOWEL OBSTRUCTION  WITH RECENT EXACERBATION OF SXS ,PERIODIC DISTENTION,N/V.  LABS REVIWED FROM 11/30 DR LOWNE-FE 40/SAT 12.6/HGB 12.6,LYTES WNL,ALB 3.5,VIT D LOW AT 18.  CONTINUE DAILY FLUIDS AS PER DR BRODIE SCHEDULE CT  ENTEROGRAPHY TO R/O TRANSITION POINT ZOFRAN 8 MG TWICE DAILY ADVISED RESOURCE OR PROTEIN SHAKES 2-3 DAILY ON DAYS WHEN SHE IS UNABLE TO EAT FOLLOW UP DR BRODIE 3 WEEKS  , DR ENNEVER MANAGES ANEMIA. Orders: GI Misc Procedure/ Radiology Order (GI Misc )  Problem # 2:  GERD (ICD-530.81) Assessment: Comment Only STABLE  Problem # 3:  B12 DEFICIENCY (ICD-266.2) Assessment: Comment Only CONTINUE REPLACEMENT MONTHLY  Problem # 4:  COPD (ICD-496) Assessment: Comment Only  Problem # 5:  ATRIAL FIBRILLATION (ICD-427.31) Assessment: Comment Only  Problem # 6:  CHRONIC PAIN SYNDROME (ICD-338.4) Assessment: Comment Only  Patient Instructions: 1)  We have scheduled the CT Enterography for Tues 02-22-09. 2)  Location is [place] [facility] [address] Across from [facility]. 3)  Please arrive at 8:30Am. Do not eat or drink anything past 5:00 AM. 4)  You will drink contrast once you arrive. 5)  We will call you with an appointment to follow up with either Dr. Juanda Chance or Mike Gip PA. 6)  Copy sent to : Dr. Loreen Freud 7)  The medication list was reviewed and reconciled.  All changed / newly prescribed medications were explained.  A complete medication list was provided to the patient / caregiver.

## 2010-04-18 NOTE — Progress Notes (Signed)
Summary: RX -dr Laury Axon  Phone Note Call from Patient Call back at [facility] Phone [phone]   Summary of Call: PATIENT WANTS RX FOR ENDOCET 10/325MG  ----[facility] ----CALL WHEN READY Initial call taken by: Okey Regal [place],  August 21, 2007 2:32 PM  Follow-up for Phone Call        patient aware to pick up rx's Ardyth Man  August 21, 2007 2:46 PM  Follow-up by: Ardyth Man,  August 21, 2007 2:46 PM      Prescriptions: FENTANYL 50 MCG/HR  PT72 (FENTANYL) change patch every three days  #10 x 0   Entered by:   Ardyth Man   Authorized by:   Loreen Freud DO   Signed by:   Ardyth Man on 08/21/2007   Method used:   Print then Give to Patient   RxID:   [id number] ENDOCET 10-325 MG  TABS (OXYCODONE-ACETAMINOPHEN) Take one tablet every 6-8 hours as needed for pain.  #120 x 0   Entered by:   Ardyth Man   Authorized by:   Loreen Freud DO   Signed by:   Ardyth Man on 08/21/2007   Method used:   Print then Give to Patient   RxID:   [id number]

## 2010-04-18 NOTE — Letter (Signed)
Summary: [facility]  [facility]   Imported By: Esmeralda Links D'jimraou 01/01/2008 15:22:02  _____________________________________________________________________  External Attachment:    Type:   Image     Comment:   External Document

## 2010-04-18 NOTE — Progress Notes (Signed)
Summary: Alej--Refill  Phone Note Refill Request   Refills Requested: Medication #1:  Promethazine hcl 25 mg #30 Refill request from [facility]  [phone]  Initial call taken by: Freddy Jaksch,  April 28, 2007 11:43 AM      Prescriptions: PROMETHAZINE HCL 25 MG  TABS (PROMETHAZINE HCL) Take one tablet 14-6 hrs as needed for nausea  #30 x 3   Entered by:   Ardyth Man   Authorized by:   Leanne Chang MD   Signed by:   Ardyth Man on 04/28/2007   Method used:   Electronically sent to ...       [facility]       [address]       [place], [place]  [id number]       Ph: [phone]       Fax: [phone]   RxID:   [id number]

## 2010-04-18 NOTE — Assessment & Plan Note (Signed)
Summary: reflux, vomitting,aspirating,diarrhea.em    History of Present Illness Visit Type: follow up Primary GI MD: [name] MD Primary Provider: Loreen Freud, M.D. Chief Complaint: nausea, vomiting,diarrhea History of Present Illness:   This is a 66 year old white female with a partial low-grade small bowel obstruction due to adhesions. She is status post exploratory laparotomy and partial colectomy in 2001 and lysis of adhesions in 2003. Her last colonoscopy in August 2000 was a difficult exam. Her last upper endoscopy in November 2002 showed retained food in the stomach. A small bowel follow-through in September 2007 showed a dilated duodenal C-loop with a slow transit time of 2 hours and she is status post total subtotal colectomy. A retrograde barium enema in October 2008  did not show any obstruction. She has  used her PICC line  only once last month for hydration. She has had 3 bouts of aspiration pneumonia; one requiring hospitalization in May 2009. She has been scheduled to see a pulmonary specialist.   GI Review of Systems    Reports abdominal pain, acid reflux, bloating, nausea, vomiting, and  weight loss.     Location of  Abdominal pain: LUQ.    Denies belching, chest pain, dysphagia with liquids, dysphagia with solids, heartburn, loss of appetite, vomiting blood, and  weight gain.      Reports diarrhea.     Denies anal fissure, black tarry stools, change in bowel habit, constipation, diverticulosis, fecal incontinence, heme positive stool, hemorrhoids, irritable bowel syndrome, jaundice, light color stool, liver problems, rectal bleeding, and  rectal pain.   Prior Medications Reviewed Using: Patient Recall  Updated Prior Medication List: ENDOCET 10-325 MG  TABS (OXYCODONE-ACETAMINOPHEN) Take one tablet every 6-8 hours as needed for pain. FENTANYL 50 MCG/HR  PT72 (FENTANYL) change patch every three days PRILOSEC 40 MG CPDR (OMEPRAZOLE) once daily CARDURA 4 MG  TABS  (DOXAZOSIN MESYLATE) Take one tablet daily ENABLEX 7.5 MG  TB24 (DARIFENACIN HYDROBROMIDE) Take daily as directed AMITRIPTYLINE HCL 25 MG  TABS (AMITRIPTYLINE HCL) Take 2 tablets every evening at bedtime CELEBREX 200 MG  CAPS (CELECOXIB) Take one tablet twice daily TEMAZEPAM 15 MG  CAPS (TEMAZEPAM) Take one to two capsules at bedtime CYCLOBENZAPRINE HCL 10 MG  TABS (CYCLOBENZAPRINE HCL) Take one tablet three times daily GABAPENTIN 600 MG  TABS (GABAPENTIN) Take one tablet 4 times daily NORVASC 10 MG  TABS (AMLODIPINE BESYLATE) 1 by mouth qd PROMETHAZINE HCL 25 MG  TABS (PROMETHAZINE HCL) Take one tablet  q 4-6 hrs as needed for nausea FLECAINIDE ACETATE 50 MG  TABS (FLECAINIDE ACETATE) 1 tablet by mouth every morning and 1/2 tablet po every night VITAMIN D [address] UNIT CAPS (ERGOCALCIFEROL) take one tablet weekly  Current Allergies (reviewed today): ! TALWIN ! SULFA Past Medical History:    Reviewed history from 05/30/2007 and no changes required:       Anemia-iron deficiency       Hypertension       Osteoarthritis       GERD  Past Surgical History:    Reviewed history from 08/07/2007 and no changes required:       Intestinal resection (5times)       Hysterectomy-complete       Appendectomy       Bladder lift       Tonsillectomy       Lysis of intestinal adhesions-numerous surgeries       PICC Line   Family History:    Reviewed history from 10/09/2007 and no changes  required:       Family History of Heart Disease: Father       Family History of CVA: Mother       No FH of Colon Cancer:  Social History:    Reviewed history from 10/09/2007 and no changes required:       Occupation: Disabled       Alcohol Use - no       Illicit Drug Use - no       Patient is a former smoker.        Daily Caffeine Use  Review of Systems       The patient complains of anemia, arthritis/joint pain, back pain, cough, fatigue, headaches-new, itching, muscle pains/cramps, shortness of breath,  swelling of feet/legs, and urine leakage.    Vital Signs:  Patient Profile:   65 Years Old Female Height:     62 inches Weight:      97.13 pounds BMI:     17.83 Pulse rate:   88 / minute Pulse rhythm:   regular BP sitting:   100 / 62  (right arm)  Vitals Entered By: June McMurray CMA (May 05, 2008 9:59 AM)                  Physical Exam  General:     thin, chronically ill-appearing, alert and oriented. Neck:     Supple; no masses or thyromegaly. Lungs:     Clear throughout to auscultation. Heart:     Regular rate and rhythm; no murmurs, rubs or bruits. Abdomen:     soft abdomen with hyperactive bowel sounds. Increased tympany and mild tenderness in epigastrium. Well healed surgical scars. No palpable mass. Rectal:     soft hemoccult positive stool. Extremities:     No clubbing, cyanosis, edema or deformities noted. Neurologic:     Alert and  oriented x4;  grossly normal neurologically.   Impression & Recommendations:  Problem # 1:  NAUSEA, CHRONIC (ICD-787.02) nausea and vomiting due to a partial small bowel obstruction from adhesions. She has gastroparesis due to adhesions . She has had several episodes of aspiration pneumonia. We will proceed with an upper endoscopy to rule out Barrett's esophagus and to obtain a small bowel biopsy to rule out sprue. Orders: EGD (EGD) Flex with Sedation (Flex w/Sed)   Problem # 2:  ANEMIA-IRON DEFICIENCY (ICD-280.9) Iron deficiency anemia with borderline low B12. She is hemoccult-positive today. We will proceed with an upper endoscopy and flexible sigmoidoscopy of the remaining colon. Patient will receive vitamin B12 1000 mcg IM today. Her last B12 level was 244 mg/ml. Orders: EGD (EGD) Flex with Sedation (Flex w/Sed) Vit B12 1000 mcg (P3295)    Patient Instructions: 1)  Vitamin B12, 1000 mcg IM 2)  Upper endoscopy with small bowel biopsies 3)  Flexible sigmoidoscopy with fleets enema prep 4)  Continue Prilosec 40  mg daily 5)  consider small bowel capsule endoscopy depending on the results of the upper and lower endoscopy. 6)  Copy Sent To:Dr Y.Lowne, Dr Demetrius Charity.Ennever  Prescriptions: PRILOSEC 40 MG CPDR (OMEPRAZOLE) Take 1 tablet by mouth once a day  #30 x 3   Entered by:   Hortense Ramal CMA   Authorized by:   Hart Carwin MD   Signed by:   Hortense Ramal CMA on 05/05/2008   Method used:   Electronically to        [facility] (retail)       [address]  [address]       [place], [place]  [address]       Ph: [phone]       Fax: [phone]   RxID:   [id number]    Medication Administration  Injection # 1:    Medication: Vit B12 1000 mcg    Diagnosis: ANEMIA-IRON DEFICIENCY (ICD-280.9)    Route: IM    Site: R deltoid    Exp Date: 10/11    Lot #: [id number]    Mfr: American Regent    Patient tolerated injection without complications    Given by: Hortense Ramal CMA (May 05, 2008 11:27 AM)  Orders Added: 1)  EGD [EGD] 2)  Flex with Sedation [Flex w/Sed] 3)  Vit B12 1000 mcg [J3420]

## 2010-04-18 NOTE — Letter (Signed)
Summary: CMN for Infusion/[facility]  CMN for Infusion/[facility]   Imported By: Lanelle Bal 05/13/2009 12:22:08  _____________________________________________________________________  External Attachment:    Type:   Image     Comment:   External Document

## 2010-04-18 NOTE — Assessment & Plan Note (Signed)
Summary: MONTHLY B12 INJ/266.2/SP  Nurse Visit   Allergies: 1)  ! Talwin 2)  ! Sulfa  Medication Administration  Injection # 1:    Medication: Vit B12 1000 mcg    Diagnosis: B12 DEFICIENCY (ICD-266.2)    Route: IM    Site: L deltoid    Exp Date: 01/18/2011    Lot #: [id number]    Mfr: American Regent    Comments: Next appointment 07/15/2009 @ 11:00am    Patient tolerated injection without complications    Given by: June McMurray CMA Duncan Dull) (June 15, 2009 11:12 AM)  Orders Added: 1)  Vit B12 1000 mcg [J3420]   Medication Administration  Injection # 1:    Medication: Vit B12 1000 mcg    Diagnosis: B12 DEFICIENCY (ICD-266.2)    Route: IM    Site: L deltoid    Exp Date: 01/18/2011    Lot #: [id number]    Mfr: American Regent    Comments: Next appointment 07/15/2009 @ 11:00am    Patient tolerated injection without complications    Given by: June McMurray CMA Duncan Dull) (June 15, 2009 11:12 AM)  Orders Added: 1)  Vit B12 1000 mcg [J3420]

## 2010-04-18 NOTE — Progress Notes (Signed)
Summary: [name] 50MCG/ENDOCET 10/325MG   Phone Note Refill Request Call back at [phone] Message from:  Patient  Refills Requested: Medication #1:  FENTANYL 50 MCG/HR  PT72 change patch every three days  Medication #2:  ENDOCET 10-325 MG  TABS Take one tablet every 6-8 hours as needed for pain. PLEASE CALL FOR PICKUP  Initial call taken by: Gwen Pounds,  May 14, 2007 1:03 PM  Follow-up for Phone Call        Patient aware to pick up rx's up front at her convenience ...................................................................Ardyth Man  May 14, 2007 1:15 PM  Follow-up by: Ardyth Man,  May 14, 2007 1:15 PM      Prescriptions: FENTANYL 50 MCG/HR  PT72 (FENTANYL) change patch every three days  #10 x 0   Entered by:   Ardyth Man   Authorized by:   Leanne Chang MD   Signed by:   Ardyth Man on 05/14/2007   Method used:   Print then Give to Patient   RxID:   [id number] ENDOCET 10-325 MG  TABS (OXYCODONE-ACETAMINOPHEN) Take one tablet every 6-8 hours as needed for pain.  #120 x 0   Entered by:   Ardyth Man   Authorized by:   Leanne Chang MD   Signed by:   Ardyth Man on 05/14/2007   Method used:   Print then Give to Patient   RxID:   [id number]

## 2010-04-18 NOTE — Assessment & Plan Note (Signed)
Summary: 3 MONTH ROA///SPH   Vital Signs:  Patient Profile:   65 Years Old Female Weight:      95.13 pounds Temp:     98.1 degrees F oral Pulse rate:   72 / minute Resp:     14 per minute BP sitting:   118 / 70  (right arm)  Pt. in pain?   no  Vitals Entered By: Ardyth Man (December 03, 2006 10:32 AM)                  Chief Complaint:  3 month follow up.  History of Present Illness: patient is here for follow-up.  She reports that she's been doing overall okay with pain management.  She still able to do her activity of daily living.  She takes her medication as prescribed.  Denies any side effects.  Regarding her blood pressure, she's taken her medication daily.  Has no complaints of chest pain or shortness of breath.        Physical Exam  General:     alert.   Neck:     No deformities, masses, or tenderness noted. Lungs:     Normal respiratory effort, chest expands symmetrically. Lungs are clear to auscultation, no crackles or wheezes. Heart:     Normal rate and regular rhythm. S1 and S2 normal without gallop, murmur, click, rub or other extra sounds. Extremities:     No clubbing, cyanosis, edema, or deformity noted with normal full range of motion of all joints.      Impression & Recommendations:  Problem # 1:  HYPERTENSION, BENIGN ESSENTIAL (ICD-401.1) Stable Her updated medication list for this problem includes:    Cardura 4 Mg Tabs (Doxazosin mesylate) .[name]... Take one tablet daily    Verelan Pm 100 Mg Cp24 (Verapamil hcl) .[name]... Take one tablet daily    Norvasc 10 Mg Tabs (Amlodipine besylate) .[name]... 1 by mouth qd  BP today: 118/70 Prior BP: 110/68 (09/05/2006)  Labs Reviewed: Creat: 0.8 (09/16/2006) Chol: 139 (03/11/2006)   HDL: 38.5 (03/11/2006)   LDL: 85 (03/11/2006)   TG: 76 (03/11/2006)   Problem # 2:  SYNDROME, CHRONIC PAIN (ICD-338.4) Stable No change in medications F/u in 3 months  Complete Medication List: 1)  Endocet 10-325 Mg  Tabs (Oxycodone-acetaminophen) .... Take one tablet every 6-8 hours as needed for pain. 2)  Fentanyl 50 Mcg/hr Pt72 (Fentanyl) .... Change patch every three days 3)  Omeprazole 20 Mg Cpdr (Omeprazole) .... Take one tablet daily as directed 4)  Cardura 4 Mg Tabs (Doxazosin mesylate) .... Take one tablet daily 5)  Enablex 7.5 Mg Tb24 (Darifenacin hydrobromide) .... Take daily as directed 6)  Amitriptyline Hcl 25 Mg Tabs (Amitriptyline hcl) .... Take 2 tablets every evening at bedtime 7)  Celebrex 200 Mg Caps (Celecoxib) .... Take once daily 8)  Temazepam 15 Mg Caps (Temazepam) .... Take one to two capsules at bedtime 9)  Cyclobenzaprine Hcl 10 Mg Tabs (Cyclobenzaprine hcl) .... Take one tablet three times daily 10)  Clonazepam 0.5 Mg Tabs (Clonazepam) .... Take 2 tablets every evening at bedtime. 11)  Gabapentin 600 Mg Tabs (Gabapentin) .... Take one tablet daily 12)  Verelan Pm 100 Mg Cp24 (Verapamil hcl) .... Take one tablet daily 13)  Mirapex 0.125 Mg Tabs (Pramipexole dihydrochloride) .[name].. 1 by mouth 2 hrs before bed time 14)  Norvasc 10 Mg Tabs (Amlodipine besylate) .[name].. 1 by mouth qd     ]

## 2010-04-18 NOTE — Letter (Signed)
Summary: Implantable Device Instructions  Architectural technologist, Main Office  [address]   [place], [place] [address]   Phone: [phone]  Fax: [phone]      Implantable Device Instructions  You are scheduled for:  ___x__ Permanent Transvenous Pacemaker (CRT-P) _____ Implantable Cardioverter Defibrillator _____ Implantable Loop Recorder _____ Generator Change  on 12/21/09 @ 5:00 pm with Dr. Graciela Husbands.  1.  Please arrive at the Short Stay Center at [facility] at 2:00 pm on the day of your procedure.  2.  Do not eat or drink after 8:00 am. Have clear liquid breakfast as discussed.  3. Labs to be drawn at hospital via portacath.   4.  Hold Furosemide the morning of the procedure.  5.  Plan for an overnight stay.  Bring your insurance cards and a list of your medications.  6.  Wash your chest and neck with antibacterial soap (any brand) the evening before and the morning of your procedure.  Rinse well.   *If you have ANY questions after you get home, please call the office [phone]. [name]  *Every attempt is made to prevent procedures from being rescheduled.  Due to the nauture of Electrophysiology, rescheduling can happen.  The physician is always aware and directs the staff when this occurs.

## 2010-04-18 NOTE — Progress Notes (Signed)
Summary: rx oxycodone & fentanyl-dr alejandro  Phone Note Call from Patient Call back at Home Phone [phone]   Caller: Patient Summary of Call: rx for oxycodone 10-325mg  ---- fentanyl pain patch 50 micrograms - 2 boxes -  Initial call taken by: Okey Regal [place],  December 06, 2006 11:47 AM  Follow-up for Phone Call        [phone] pt. aware ...................................................................Ardyth Man  December 06, 2006 12:40 PM  Follow-up by: Ardyth Man,  December 06, 2006 12:40 PM      Prescriptions: FENTANYL 50 MCG/HR  PT72 (FENTANYL) change patch every three days  #10 x 0   Entered by:   Ardyth Man   Authorized by:   Leanne Chang MD   Signed by:   Ardyth Man on 12/06/2006   Method used:   Print then Give to Patient   RxID:   [id number] ENDOCET 10-325 MG  TABS (OXYCODONE-ACETAMINOPHEN) Take one tablet every 6-8 hours as needed for pain.  #120 x 0   Entered by:   Ardyth Man   Authorized by:   Leanne Chang MD   Signed by:   Ardyth Man on 12/06/2006   Method used:   Print then Give to Patient   RxID:   [id number]

## 2010-04-18 NOTE — Letter (Signed)
Summary: CMN for Infusion/[facility]  [facility]N for Infusion/[facility]   Imported By: Lanelle Bal 06/17/2009 09:44:45  _____________________________________________________________________  External Attachment:    Type:   Image     Comment:   External Document

## 2010-04-18 NOTE — Progress Notes (Signed)
Summary: [name]--GABAPENTINE 600MG   Phone Note Refill Request Call back at [phone]   Refills Requested: Medication #1:  GABAPENTINE 600 MG PT CALLING FOR A REFILL REQUEST TO [facility]  Initial call taken by: Freddy Jaksch,  March 21, 2007 9:00 AM      Prescriptions: GABAPENTIN 600 MG  TABS (GABAPENTIN) Take one tablet daily  #30 x 3   Entered by:   Ardyth Man   Authorized by:   Leanne Chang MD   Signed by:   Ardyth Man on 03/21/2007   Method used:   Telephoned to ...       [facility]       [address]       [place], [place]  [address]       Ph: [phone]       Fax: [phone]   RxID:   [id number]

## 2010-04-18 NOTE — Progress Notes (Signed)
Summary: CARDURA REFILL  Phone Note Refill Request Message from:  Fax from Pharmacy on October 02, 2007 3:09 PM  Refills Requested: Medication #1:  CARDURA 4 MG  TABS Take one tablet daily [facility]-FAX:[phone]  Initial call taken by: Doristine Devoid,  October 02, 2007 3:09 PM      Prescriptions: CARDURA 4 MG  TABS (DOXAZOSIN MESYLATE) Take one tablet daily  #30 x 2   Entered by:   Kandice Hams   Authorized by:   Loreen Freud DO   Signed by:   Kandice Hams on 10/02/2007   Method used:   Electronically sent to ...       [facility]       [address]       [place], [place]  [address]       Ph: [phone]       Fax: [phone]   RxID:   [id number]

## 2010-04-18 NOTE — Progress Notes (Signed)
Summary: refill   Phone Note Call from Patient   Caller: Patient Reason for Call: Refill Medication Summary of Call: dr. Blossom Hoops [phone] would like to pick up her rx for endocet 10/325 mg tab 4 times a day if needed. would to have # 120 instead of #60  its cheaper for her insurance Initial call taken by: Charolette Child,  September 30, 2006 10:17 AM     Prescriptions: ENDOCET 10-325 MG  TABS (OXYCODONE-ACETAMINOPHEN) Take one tablet every 6-8 hours as needed for pain.  #120 x 0   Entered by:   Ardyth Man   Authorized by:   Leanne Chang MD   Signed by:   Ardyth Man on 09/30/2006   Method used:   Telephoned to ...         RxID:   [id number] ENDOCET 10-325 MG  TABS (OXYCODONE-ACETAMINOPHEN) Take one tablet every 6-8 hours as needed for pain.  #60 x 0   Entered by:   Daine Gip   Authorized by:   Leanne Chang MD   Signed by:   Daine Gip on 09/30/2006   Method used:   Print then Give to Patient   RxID:   [id number]

## 2010-04-18 NOTE — Assessment & Plan Note (Signed)
Summary: monthly B12 inj. bs  Nurse Visit   Allergies: 1)  ! Talwin 2)  ! Sulfa  Medication Administration  Injection # 1:    Medication: Vit B12 1000 mcg    Diagnosis: B12 DEFICIENCY (ICD-266.2)    Route: IM    Site: R deltoid    Exp Date: 12/2010    Lot #: [id number]    Mfr: American Regent    Patient tolerated injection without complications    Given by: Christie Nottingham CMA (AAMA) (March 14, 2009 10:45 AM)  Orders Added: 1)  Vit B12 1000 mcg [J3420]

## 2010-04-18 NOTE — Letter (Signed)
Summary: [facility] [place] [place]   Imported By: Freddy Jaksch 02/10/2007 17:19:31  _____________________________________________________________________  External Attachment:    Type:   Image     Comment:   External Document

## 2010-04-18 NOTE — Miscellaneous (Signed)
Summary: omeprazole Refills  Clinical Lists Changes  Medications: Rx of PRILOSEC 40 MG CPDR (OMEPRAZOLE) Take 1 tablet by mouth once a day;  #30 x 4;  Signed;  Entered by: Hortense Ramal CMA (AAMA);  Authorized by: Hart Carwin MD;  Method used: Electronically to [facility] [address], [place], [place]  [id number], Ph: [phone], Fax: [phone]    Prescriptions: PRILOSEC 40 MG CPDR (OMEPRAZOLE) Take 1 tablet by mouth once a day  #30 x 4   Entered by:   Hortense Ramal CMA (AAMA)   Authorized by:   Hart Carwin MD   Signed by:   Hortense Ramal CMA (AAMA) on 01/18/2009   Method used:   Electronically to        [facility] (retail)       [address]       [place], [place]  [address]       Ph: [phone]       Fax: [phone]   RxID:   [id number]

## 2010-04-18 NOTE — Progress Notes (Signed)
Summary: [facility]--refill  Phone Note Refill Request   Refills Requested: Medication #1:  TEMAZEPAM 15 MG  CAPS Take one to two capsules at bedtime   Last Refilled: 05/19/2007 Rx received via fax from [facility]--[phone]  Initial call taken by: Freddy Jaksch,  July 16, 2007 3:30 PM  Follow-up for Phone Call        ok 60 x 3 rf Follow-up by: Nolon Rod. [facility] MD,  July 16, 2007 4:58 PM      Prescriptions: TEMAZEPAM 15 MG  CAPS (TEMAZEPAM) Take one to two capsules at bedtime  #60 x 3   Entered by:   Shary Decamp   Authorized by:   Nolon Rod. Paz MD   Signed by:   Shary Decamp on 07/17/2007   Method used:   Printed then faxed to ...       [facility]       [address]       [place], [place]  [address]       Ph: [phone]       Fax: [phone]   RxID:   [id number]

## 2010-04-18 NOTE — Progress Notes (Signed)
Summary: Iron Infusion/Repeat Labs Scheduled   Phone Note Outgoing Call   Call placed by: Laureen Ochs LPN,  September 21, 9145 10:18 AM Call placed to: Patient Summary of Call: Follow-up from labs done 09-20-09. Pt. is scheduled for a Iron infusion at [facility] on 09-27-09 at 10am and she will repeat labs around 10-25-09 at [facility].  All instructions reviewed w/pt. by phone. Pt. instructed to call back as needed.  Initial call taken by: Laureen Ochs LPN,  September 21, 8293 10:19 AM

## 2010-04-18 NOTE — Progress Notes (Signed)
Summary: Refax order   Phone Note Other Incoming   Caller: [facility]  [phone] Summary of Call: Infusion Order was faxed to wrong number Needs it refaxed to [phone] Initial call taken by: Karna Christmas,  February 01, 2010 10:22 AM  Follow-up for Phone Call        order refaxed Follow-up by: Lamona Curl CMA Duncan Dull),  February 01, 2010 12:17 PM

## 2010-04-18 NOTE — Progress Notes (Signed)
  Phone Note Outgoing Call   Call placed by: Ardyth Man,  September 09, 2006 9:51 AM Call placed to: Patient Summary of Call: [facility] when she gets back from her vacation.

## 2010-04-18 NOTE — Progress Notes (Signed)
Summary: Refill Request  Phone Note Refill Request Message from:  Pharmacy on [facility] Fax #: [phone]  Refills Requested: Medication #1:  GABAPENTIN 600 MG  TABS Take one tablet 4 times daily   Dosage confirmed as above?Dosage Confirmed   Supply Requested: 1 month   Last Refilled: 05/25/2009 Next Appointment Scheduled: 6.16.11 Initial call taken by: Harold Barban,  June 21, 2009 10:31 AM    Prescriptions: GABAPENTIN 600 MG  TABS (GABAPENTIN) Take one tablet 4 times daily  #120 x 2   Entered by:   Army Fossa CMA   Authorized by:   Loreen Freud DO   Signed by:   Army Fossa CMA on 06/21/2009   Method used:   Electronically to        [facility] (retail)       [address]       [place], [place]  [address]       Ph: [phone]       Fax: [phone]   RxID:   [id number]

## 2010-04-18 NOTE — Assessment & Plan Note (Signed)
Summary: roa 3 months,cbs   Vital Signs:  Patient Profile:   65 Years Old Female Height:     62 inches Weight:      97.6 pounds Temp:     98.1 degrees F oral Pulse rate:   74 / minute BP sitting:   100 / 62  (left arm)  Pt. in pain?   no  Vitals Entered By: Jeremy Johann CMA (March 05, 2008 9:35 AM)                  PCP:  Loreen Freud, M.D.  Chief Complaint:  follow up pneumonia.  History of Present Illness: Pt here to f/u pneumonia.  Pt feeling much better.  Still taking abx.    Current Allergies (reviewed today): ! TALWIN ! SULFA  Past Medical History:    Reviewed history from 05/30/2007 and no changes required:       Anemia-iron deficiency       Hypertension       Osteoarthritis       GERD   Family History:    Reviewed history from 10/09/2007 and no changes required:       Family History of Heart Disease: Father       Family History of CVA: Mother       No FH of Colon Cancer:  Social History:    Reviewed history from 10/09/2007 and no changes required:       Occupation: Disabled       Alcohol Use - no       Illicit Drug Use - no       Patient is a former smoker.        Daily Caffeine Use   Risk Factors: Tobacco use:  quit    Year quit:  20 years ago Drug use:  no Caffeine use:  1 drinks per day Alcohol use:  no   Review of Systems      See HPI   Physical Exam  General:     Well-developed,well-nourished,in no acute distress; alert,appropriate and cooperative throughout examination Lungs:     Normal respiratory effort, chest expands symmetrically. Lungs are clear to auscultation, no crackles or wheezes. Heart:     normal rate and regular rhythm.   Psych:     Cognition and judgment appear intact. Alert and cooperative with normal attention span and concentration. No apparent delusions, illusions, hallucinations    Impression & Recommendations:  Problem # 1:  PNEUMONIA (ICD-486)  Her updated medication list for this problem  includes:    Avelox 400 Mg Tabs (Moxifloxacin hcl) .[name]... 1 by mouth once daily Instructed patient to complete antibiotics, and call for worsened shortness of breath or new symptoms.   Complete Medication List: 1)  Endocet 10-325 Mg Tabs (Oxycodone-acetaminophen) .... Take one tablet every 6-8 hours as needed for pain. 2)  Fentanyl 50 Mcg/hr Pt72 (Fentanyl) .... Change patch every three days 3)  Omeprazole 20 Mg Cpdr (Omeprazole) .... Take one tablet daily as directed 4)  Cardura 4 Mg Tabs (Doxazosin mesylate) .... Take one tablet daily 5)  Enablex 7.5 Mg Tb24 (Darifenacin hydrobromide) .... Take daily as directed 6)  Amitriptyline Hcl 25 Mg Tabs (Amitriptyline hcl) .... Take 2 tablets every evening at bedtime 7)  Celebrex 200 Mg Caps (Celecoxib) .... Take one tablet twice daily 8)  Temazepam 15 Mg Caps (Temazepam) .... Take one to two capsules at bedtime 9)  Cyclobenzaprine Hcl 10 Mg Tabs (Cyclobenzaprine hcl) .... Take one tablet three  times daily 10)  Gabapentin 600 Mg Tabs (Gabapentin) .... Take one tablet 4 times daily 11)  Norvasc 10 Mg Tabs (Amlodipine besylate) .[name].. 1 by mouth qd 12)  Promethazine Hcl 25 Mg Tabs (Promethazine hcl) .... Take one tablet  q 4-6 hrs as needed for nausea 13)  Fosamax 70 Mg Tabs (Alendronate sodium) .... Take as directed weekly 14)  Flecainide Acetate 50 Mg Tabs (Flecainide acetate) .[name].. 1 tablet by mouth every morning and 1/2 tablet po every night 15)  Vitamin D [address] Unit Caps (Ergocalciferol) .... Take one tablet weekly 16)  Avelox 400 Mg Tabs (Moxifloxacin hcl) .[name].. 1 by mouth once daily  Other Orders: T-2 View CXR (71020TC)    ]

## 2010-04-18 NOTE — Assessment & Plan Note (Signed)
Summary: MONTHLY B12 SHOT...LSW.  Nurse Visit   Medication Administration  Injection # 1:    Medication: Vit B12 1000 mcg    Diagnosis: B12 DEFICIENCY (ICD-266.2)    Route: IM    Site: L deltoid    Exp Date: 01/2011    Lot #: [id number]    Mfr: [employer] Regent    Comments: Pt will return on 06/20/09 for next injection    Patient tolerated injection without complications    Given by: Francee Piccolo CMA Duncan Dull) (May 23, 2009 11:15 AM)  Orders Added: 1)  Vit B12 1000 mcg [J3420]

## 2010-04-18 NOTE — Procedures (Signed)
Summary: Gastroenterology EGD  Gastroenterology EGD   Imported By: Irma Newness 08/07/2007 14:04:06  _____________________________________________________________________  External Attachment:    Type:   Image     Comment:   External Document

## 2010-04-18 NOTE — Progress Notes (Signed)
Summary: returning call  Phone Note Call from Patient Call back at Home Phone [phone]   Caller: Patient Call For: sood Summary of Call: Returning Dr. Evlyn Courier call. Initial call taken by: Darletta Moll,  February 21, 2010 1:20 PM  Follow-up for Phone Call        pt aware of results, pt states she has o2 at home that she got during a hospital stay, she states dr Craige Cotta was aware of this and this is why the ONO was ordered because her sats were dropping at night while in the hospital. told pt to go ahead and start wearing the o2 at 2lpm while sleeping and we will send new order to [facility] so they have sats and liter flow on file, also will send order to [facility] for a new ONO on 2lpm--pls advise if anything else needed to be done Follow-up by: Philipp Deputy CMA,  February 21, 2010 4:23 PM  Additional Follow-up for Phone Call Additional follow up Details #1::        Have placed order with [facility] for 2 liters oxygen with sleep and do ONO on 2 liters. Additional Follow-up by: Coralyn Helling MD,  February 22, 2010 11:06 AM  New Problems: HYPOXEMIA (ICD-799.02)   New Problems: HYPOXEMIA (ICD-799.02)

## 2010-04-18 NOTE — Progress Notes (Signed)
Summary: ALEJANDRO-REFILL PROMETHAZINE HCL 25MG   Phone Note Refill Request   Refills Requested: Medication #1:  PROMETHAZINE HCL 25MG  TAKE ONE TABLET EVERY 4-6 HOURS AS NEEDED FOR NAUSEA RECD BY [facility] [phone] LAST FILLED #30 ON 8.30.08  Initial call taken by: Gwen Pounds,  January 29, 2007 10:25 AM    New/Updated Medications: PROMETHAZINE HCL 25 MG  TABS (PROMETHAZINE HCL) Take one tablet 14-6 hrs as needed for nausea   Prescriptions: PROMETHAZINE HCL 25 MG  TABS (PROMETHAZINE HCL) Take one tablet 14-6 hrs as needed for nausea  #30 x 0   Entered by:   Ardyth Man   Authorized by:   Leanne Chang MD   Signed by:   Ardyth Man on 01/29/2007   Method used:   Telephoned to ...       [facility]       [address]       [place], [place]  [address]  [place]       Ph: [phone]       Fax: [phone]   RxID:   [id number]

## 2010-04-18 NOTE — Letter (Signed)
Summary: [facility]   Imported By: Lester [place] 05/02/2009 09:46:31  _____________________________________________________________________  External Attachment:    Type:   Image     Comment:   External Document

## 2010-04-18 NOTE — Letter (Signed)
Summary: [facility] Medical Necessity  Letter  [facility]  [address] [place], [place] [address]   Phone: [phone]  Fax: [phone]              Jul 21, 2009 MRN: [id number]    ON:[facility] [place]    [address]    [place], [place]  [address]    To Whom It May Concern:  [name] is a patient who has been under my care for multiple gastroentestinal concerns. Ms. Campton has recurrent problems with small bowel obstructions as well as difficulty with gastroparesis. Due to both of these conditions, Ms. Los is often extremely nauseated and unable to consume food orally. She recently had a percutaneous gastrostomy tube placed because of her weight loss and occasional inability to eat.I find it medically necessary that she be given a nutritional supplement via her gastrostomy to aid in weight gain and prevention of malnutrition. Should you have any questions, please feel free to contact me at my office, [phone].  Dx Codes: 787.01, 536.3  Sincerely,     Hedwig Morton. Juanda Chance, MD [facility]

## 2010-04-18 NOTE — Letter (Signed)
Summary: [facility] Progress Note   [facility] Progress Note   Imported By: Roderic Ovens 02/23/2010 10:17:53  _____________________________________________________________________  External Attachment:    Type:   Image     Comment:   External Document

## 2010-04-18 NOTE — Progress Notes (Signed)
Summary: othro surgeon  Phone Note Call from Patient Call back at Home Phone [phone] Call back at [phone]   Summary of Call: dr. Blossom Hoops pt is calling about a referral for ortho. Careers adviser. Dr. Drue Novel was the doctor that seen the pt in the hospital. pt fell tuesday night around 11:30pm. she basically passed out. and they admited her as well to make sure nothing was wrong with her heart. she did go home with a heart monitor. Initial call taken by: Charolette Child,  February 14, 2007 2:34 PM  Follow-up for Phone Call        Spoke w/patient and has fractured the largest bone in left arm on Tuesday and Dr. Drue Novel told her that Dr. Blossom Hoops would do a referral because it is in a sling at this time. ...................................................................Ardyth Man  February 14, 2007 2:41 PM  Follow-up by: Ardyth Man,  February 14, 2007 2:41 PM  Additional Follow-up for Phone Call Additional follow up Details #1::        Patient wanted to be seen Monday Dec. 1, 2008 scheduled ov at 11:45 am ...................................................................Ardyth Man  February 14, 2007 2:43 PM  Additional Follow-up by: Ardyth Man,  February 14, 2007 2:43 PM

## 2010-04-18 NOTE — Procedures (Signed)
Summary: EGD   EGD  Procedure date:  06/02/2008  Findings:      Location: [facility]    ENDOSCOPY PROCEDURE REPORT  PATIENT:  [name], [name]  MR#:  [id number] BIRTHDATE:   1946/01/01   GENDER:   female  ENDOSCOPIST:   Hedwig Morton. Juanda Chance, MD Referred by: Arlan Organ, M.D.  PROCEDURE DATE:  06/02/2008 PROCEDURE:  EGD with biopsy ASA CLASS:   Class II INDICATIONS: nausea and vomiting, weight loss, abdominal pain   MEDICATIONS:    Versed 10 mg, Fentanyl 100 mcg, Benadryl 50 mg TOPICAL ANESTHETIC:   Exactacain Spray  DESCRIPTION OF PROCEDURE:   After the risks benefits and alternatives of the procedure were thoroughly explained, informed consent was obtained.  The [facility] [id number] endoscope was introduced through the mouth and advanced to the second portion of the duodenum, without limitations.  The instrument was slowly withdrawn as the mucosa was fully examined. <<PROCEDUREIMAGES>>            <<OLD IMAGES>>  Abnormal appearing mucosa in the descending duodenum. mildly dilated duodenal lumen, no acute inflammatory changes Random biopsies were obtained and sent to pathology (see image5 and image4).  Mild gastritis was found. diffuse antral erythema With standard forceps, a biopsy was obtained and sent to pathology (see image6, image3, and image2).  Esophagitis was found in the distal esophagus. mild esophagitis With standard forceps, a biopsy was obtained and sent to pathology (see image1 and image7).    Retroflexed views revealed no abnormalities.    The scope was then withdrawn from the patient and the procedure completed.  COMPLICATIONS:   None  ENDOSCOPIC IMPRESSION:  1) Abnormal mucosa in the descending duodenum  2) Mild gastritis  3) Esophagitis in the distal esophagus  mild dilation of the descending duodenum, no acute inflammation of the mucosa, Bx's taken  s/p CLO test, s/p Esophageal Bx's at the g-e junction RECOMMENDATIONS:  1) await biopsy  results  Prelosec 20 mg po qd  REPEAT EXAM:   In 0 year(s) for.   _______________________________ Hedwig Morton. Juanda Chance, MD    CC: Arlan Organ, MD     REPORT OF SURGICAL PATHOLOGY   Case #: [id number] Patient Name: [name]. Office Chart Number:  [id number]   MRN: [id number] Pathologist: Alden Server A. Delila Spence, MD DOB/Age  [birth year] (Age: 65)    Gender: F Date Taken:  06/02/2008 Date Received: 06/02/2008   FINAL DIAGNOSIS   ***MICROSCOPIC EXAMINATION AND DIAGNOSIS***   1. SMALL BOWEL BIOPSY:  BENIGN SMALL BOWEL MUCOSA.  NO VILLOUS ATROPHY, INFLAMMATION OR OTHER ABNORMALITIES PRESENT.   2. ESOPHAGUS, BIOPSIES:  BENIGN SQUAMOUS AND GASTRIC CARDIA MUCOSA.  NO INTESTINAL METAPLASIA, DYSPLASIA OR MALIGNANCY IDENTIFIED.   3. COLON, BIOPSY:  BENIGN COLONIC MUCOSA.  NO SIGNIFICANT INFLAMMATION OR OTHER ABNORMALITIES IDENTIFIED.    COMMENT 1. There is small bowel mucosa with normal villous architecture and no objective increase in inflammation.  No villous atrophy, active inflammation or other significant changes identified.   2. An Alcian Blue stain is performed to determine the presence of intestinal metaplasia (goblet cell metaplasia). No intestinal metaplasia (goblet cell metaplasia) is identified with the Alcian Blue stain. The control stained appropriately.   3. There is colorectal mucosa with normal crypt architecture and no objective increase in inflammation.  No active inflammation, microscopic colitis, collagenous colitis or significant chronic changes identified. No hyperplastic or adenomatous changes are seen, and there is no evidence of malignancy. (EA:jy) 06/03/08   jy Date Reported:  06/03/2008  Lyn Hollingshead Delila Spence, MD *** Electronically Signed Out By EAA ***  June 05, 2008 MRN: [id number]    [facility] [address] [place], [place]  [address]    Dear [name],  I am pleased to inform you that the biopsies taken during your recent endoscopic  examination did not show any evidence of cancer upon pathologic examination.The biopsies taken from Your stomach, small bowl and colon show normal tissue, no evidence of colitis  Additional information/recommendations:  __No further action is needed at this time.  Please follow-up with      your primary care physician for your other healthcare needs.  __ Please call [phone] to schedule a return visit to review      your condition.  x__ Continue with the treatment plan as outlined on the day of your      exam.    Please call us if you are having persistent problems or have questions about your condition that have not been fully answered at this time.  Sincerely,  Hart Carwin  This letter has been electronically signed by your physician.  This report was created from the original endoscopy report, which was reviewed and signed by the above listed endoscopist.

## 2010-04-18 NOTE — Consult Note (Signed)
Summary: [facility]   [place]   Imported By: Sherian Rein 02/24/2010 11:17:45  _____________________________________________________________________  External Attachment:    Type:   Image     Comment:   External Document

## 2010-04-18 NOTE — Progress Notes (Signed)
Summary: [facility]---RX  Phone Note Refill Request   Refills Requested: Medication #1:  CELEBREX 200 MG  CAPS Take one tablet twice daily [facility]--[phone]  Initial call taken by: Freddy Jaksch,  July 12, 2008 10:41 AM      Prescriptions: CELEBREX 200 MG  CAPS (CELECOXIB) Take one tablet twice daily  #60 x 3   Entered by:   Jeremy Johann CMA   Authorized by:   Loreen Freud DO   Signed by:   Jeremy Johann CMA on 07/13/2008   Method used:   Faxed to ...       [facility] (retail)       [address]       [place], [place]  [address]       Ph: [phone]       Fax: [phone]   RxID:   [id number]

## 2010-04-18 NOTE — Assessment & Plan Note (Signed)
Summary: MONTHLY B12 SHOT...LSW.  Nurse Visit   Allergies: 1)  ! Talwin 2)  ! Sulfa  Medication Administration  Injection # 1:    Medication: Vit B12 1000 mcg    Diagnosis: B12 DEFICIENCY (ICD-266.2)    Route: IM    Site: R deltoid    Exp Date: 12/12    Lot #: [id number]    Mfr: [employer] Regent    Comments: pt scheduled for next monthly b12 at front desk    Patient tolerated injection without complications    Given by: Chales Abrahams CMA Duncan Dull) (Aug 16, 2009 11:06 AM)

## 2010-04-18 NOTE — Letter (Signed)
Summary: [facility]   Imported By: Sherian Rein 05/25/2009 13:51:30  _____________________________________________________________________  External Attachment:    Type:   Image     Comment:   External Document

## 2010-04-18 NOTE — Progress Notes (Signed)
Summary: discuss labs  Phone Note Outgoing Call   Call placed by: Doristine Devoid,  November 25, 2008 9:09 AM Call placed to: Patient Summary of Call: + anemia-----  chromagen forte 1 by mouth once daily #30  2 refills----recheck 1 month---cbcd, ibc ,  ferritin  285.9   Follow-up for Phone Call        spoke with patient aware of labs and start of iron supplement and appt scheduled for 1 month..................Marland KitchenDoristine Devoid  November 25, 2008 9:11 AM     New/Updated Medications: * CHROMAGEN FORTE take one tablet daily Prescriptions: CHROMAGEN FORTE take one tablet daily  #30 x 2   Entered by:   Doristine Devoid   Authorized by:   Loreen Freud DO   Signed by:   Doristine Devoid on 11/25/2008   Method used:   Faxed to ...       [facility] (retail)       [address]       [place], [place]  [address]       Ph: [phone]       Fax: [phone]   RxID:   [id number]

## 2010-04-18 NOTE — Assessment & Plan Note (Signed)
Summary: roa 3 month,cbs   Vital Signs:  Patient Profile:   65 Years Old Female Weight:      98.38 pounds Temp:     97.8 degrees F oral Pulse rate:   62 / minute Resp:     16 per minute BP sitting:   100 / 64  (right arm)  Pt. in pain?   no  Vitals Entered By: Ardyth Man (June 02, 2007 11:20 AM)                  Chief Complaint:  3 month follow up.  History of Present Illness: Patient is being followed closely by cardiology (Dr. Deborah Chalk). Patient reports she has an arrythmia and may need a pacemaker, but currently is on flecainide.  Patient reports labs (Kidney function) has been checked at Dr. Ronnald Nian office.  Regarding her chronic pain,  she  has been well controlled. No side effects from medications. No need to adjust.  Followed every 3 months without issues. Medications filled  monthly and picked when needed.  No c/o worsening fatigue, constipation, lethargy, paresthesia, weakness. Patient reports she is rarely taking Elavil, but we will keep on her medication list for now.     Current Allergies: ! TALWIN ! SULFA  Past Medical History:    Reviewed history from 05/30/2007 and no changes required:       Anemia-iron deficiency       Hypertension       Osteoarthritis       GERD  Past Surgical History:    Reviewed history from 08/01/2006 and no changes required:       Intestinal resection (5times)       Hysterectomy       Appendectomy       Bladder lift      Physical Exam  General:     alert and pleasant Neck:     No deformities, masses, or tenderness noted. Lungs:     Normal respiratory effort, chest expands symmetrically. Lungs are clear to auscultation, no crackles or wheezes. Heart:     normal rate, regular rhythm, no murmur, and no gallop.   Msk:     No deformity or scoliosis noted of thoracic or lumbar spine.   Extremities:     No clubbing, cyanosis, edema, or deformity noted with normal full range of motion of all joints.   Neurologic:     alert & oriented X3.   Psych:     Oriented X3, memory intact for recent and remote, normally interactive, good eye contact, not anxious appearing, and not depressed appearing.      Impression & Recommendations:  Problem # 1:  HYPERTENSION, BENIGN ESSENTIAL (ICD-401.1) Will defer to cardiology for now since being seen by Dr. Deborah Chalk for arrythmia and medications are being adjusted.  The following medications were removed from the medication list:    Verelan Pm 100 Mg Cp24 (Verapamil hcl) .[name]... Take one tablet daily  Her updated medication list for this problem includes:    Cardura 4 Mg Tabs (Doxazosin mesylate) .[name]... Take one tablet daily    Norvasc 10 Mg Tabs (Amlodipine besylate) .[name]... 1 by mouth qd  BP today: 100/64 Prior BP: 98/68 (03/03/2007)  Labs Reviewed: Creat: 0.8 (09/16/2006) Chol: 139 (03/11/2006)   HDL: 38.5 (03/11/2006)   LDL: 85 (03/11/2006)   TG: 76 (03/11/2006)   Problem # 2:  CHRONIC PAIN SYNDROME (ICD-338.4) Stable Medications to be filled on current regimen. I would recommend removing  Elavil  from med lis if still not taking it by next visit. F/u in 3 months  Problem # 3:  RESTLESS LEG SYNDROME (ICD-333.94) stable  Complete Medication List: 1)  Endocet 10-325 Mg Tabs (Oxycodone-acetaminophen) .... Take one tablet every 6-8 hours as needed for pain. 2)  Fentanyl 50 Mcg/hr Pt72 (Fentanyl) .... Change patch every three days 3)  Omeprazole 20 Mg Cpdr (Omeprazole) .... Take one tablet daily as directed 4)  Cardura 4 Mg Tabs (Doxazosin mesylate) .... Take one tablet daily 5)  Enablex 7.5 Mg Tb24 (Darifenacin hydrobromide) .... Take daily as directed 6)  Amitriptyline Hcl 25 Mg Tabs (Amitriptyline hcl) .... Take 2 tablets every evening at bedtime 7)  Celebrex 200 Mg Caps (Celecoxib) .... Take one tablet twice daily 8)  Temazepam 15 Mg Caps (Temazepam) .... Take one to two capsules at bedtime 9)  Cyclobenzaprine Hcl 10 Mg Tabs (Cyclobenzaprine hcl) .... Take  one tablet three times daily 10)  Gabapentin 600 Mg Tabs (Gabapentin) .... Take one tablet 4 times daily 11)  Mirapex 0.125 Mg Tabs (Pramipexole dihydrochloride) .[name].. 1 by mouth 2 hrs before bed time 12)  Norvasc 10 Mg Tabs (Amlodipine besylate) .[name].. 1 by mouth qd 13)  Promethazine Hcl 25 Mg Tabs (Promethazine hcl) .... Take one tablet 14-6 hrs as needed for nausea 14)  Fosamax 70 Mg Tabs (Alendronate sodium) .... Take as directed weekly 15)  Flecainide Acetate 50 Mg Tabs (Flecainide acetate)     ]

## 2010-04-18 NOTE — Discharge Summary (Signed)
Summary: Iron-Deficiency Anemia; Abdominal Adhesions    NAME:  [name], [name]               ACCOUNT NO.:  [id number]      MEDICAL RECORD NO.:  [id number]          PATIENT TYPE:  INP      LOCATION:  5124                         FACILITY:  [facility]      PHYSICIAN:  Juanetta Gosling, MDDATE OF BIRTH:  [birth year]      DATE OF ADMISSION:  04/27/2009   DATE OF DISCHARGE:  05/06/2009                                  DISCHARGE SUMMARY      ADMISSION DIAGNOSES:   1. History of abdominal adhesions.   2. Iron-deficiency anemia.   3. Fibromyalgia.      DISCHARGE DIAGNOSES:   1. History of abdominal adhesions.   2. Iron-deficiency anemia.   3. Fibromyalgia.      PROCEDURES:   1. Exploratory laparotomy and lysis of adhesions x4 hours and a 28-       French gastrostomy tube placement.      HISTORY AND HOSPITAL COURSE:  [name] is a 65 year old female with   longstanding surgical history, last been adhesiolysis small bowel   resection by Dr. Leroy Sea in a number of years ago.  She had been   followed with a current partial small bowel obstruction for length of   time where she was down to about 89 pounds.  She is now up to about 100   pounds.  She subsequently underwent an evaluation by Dr. [name]   showing a CT enterography with what appears to be a partial small bowel   obstruction.  I sent her for barium enema to ensure that her ileosigmoid   anastomosis was open and this was appeared to be fairly normal.  She   continues to be maintained via fluid through her PICC line and can only   eat occasionally.  She certainly had some motility issues but did appear   both clinically and on Radiology to have a chronic high-grade small   bowel obstruction.  I had a long discussion with her husband about going   to the operating room.  She eventually underwent an exploratory   laparotomy with 4 hours of lysis of adhesions and a gastrostomy tube   placed and her abdomen was indeed frozen  once I was able to mobilize and   her bowel was unable to be separated from each other.  Postoperatively,   she was sore and her pain was maintained on PCA.  I took her NG tube out   the following day and Konrad Dolores G-tube to drainage over this time.  Really   over the next several days, she did not have any return of bowel   function.  We  were just awaiting her ileus to resolve.  I did start TPN   on postoperative day 2 due to my expected prolonged ileus.  Her Foley   was discontinued and I restarted her heparin after she had some oozing   from her wound that day as well.  By postoperative day #4, she continued   to have no return of her bowel  function and eventually had another PICC   line placed and had her old PICC line removed due to the fact that it   was not working.  On postoperative day #6, she was passing flatus, had a   small bowel movement.  I clamped her gastrostomy tube at that point   which she tolerated very well.  I then began to advance her diet with   clear liquids.  I did keep her on TNA.  At this point, eventually she   was half rated on her TNA with a full liquids and a regular diet which   she tolerated very well.  She was then discharged home with some   assistance.      DISCHARGE INSTRUCTIONS:  She was waiting home health to come with her to   do her PICC flushes in PICC line care.  She was not to do any heavy   lifting for 4 weeks.  She was due to follow up with me in 1 week in my   office.      MEDICATIONS UPON DISCHARGE:   1. Fentanyl 550 mcg transdermal every 3 days.   2. Oxycodone 10/325 one every 6-8 hours.   3. Gabapentin 600 q.i.d.   4. Flecainide 50 mg 1 q.a.m. and half q.p.m.   5. Meloxicam.   6. Omeprazole 40 mg daily.   7. Cyclobenzaprine.   8. Ondansetron as needed.   9. Amlodipine 10 mg daily.   10.Temazepam 50 mg every night.   11.Spiriva.   12.She was also placed on MiraLax as needed.      Her pertinent laboratory evaluation showed a initial  hematocrit of 34.5   and on discharge it was 31.2.  Her white blood cell count remained   normal throughout her hospital stay as did her creatinine.  Her albumin   upon discharge was 1.9.  Pertinent radiologic evaluation showed COPD no   acute disease and a PICC line in place after she had placed on May 02, 2009.               Juanetta Gosling, MD            MCW/MEDQ  D:  06/02/2009  T:  06/03/2009  Job:  [id number]      Electronically Signed by Emelia Loron MD on 06/03/2009 06:17:10 PM

## 2010-04-18 NOTE — Assessment & Plan Note (Signed)
Summary: hospital follow up 15 minutes   Vital Signs:  Patient Profile:   65 Years Old Female Weight:      96 pounds O2 Sat:      97 % O2 treatment:    Oxygen Temp:     98.4 degrees F oral Pulse rate:   62 / minute Pulse rhythm:   regular Resp:     16 per minute BP sitting:   110 / 70  (right arm)  Pt. in pain?   no  Vitals Entered By: Ardyth Man (August 26, 2007 3:26 PM)                  PCP:  Laury Axon  Chief Complaint:  Hospital follow up.  History of Present Illness: Pt here f/u hospitalization for anemia, pneumonia and UTI.  Pt doing much better and hopes to get off oxygen.  See Hospital D/C summary.      Current Allergies: ! TALWIN ! SULFA  Past Medical History:    Reviewed history from 05/30/2007 and no changes required:       Anemia-iron deficiency       Hypertension       Osteoarthritis       GERD  Past Surgical History:    Reviewed history from 08/07/2007 and no changes required:       Intestinal resection (5times)       Hysterectomy-complete       Appendectomy       Bladder lift       Tonsillectomy       Lysis of intestinal adhesions-numerous surgeries       PICC Line   Family History:    Reviewed history from 08/07/2007 and no changes required:       Family History of Heart Disease: Father       Family History of CVA: Mother  Social History:    Reviewed history from 08/07/2007 and no changes required:       Occupation: Disabled       Alcohol Use - no       Illicit Drug Use - no    Review of Systems      See HPI   Physical Exam  General:     Well-developed,well-nourished,in no acute distress; alert,appropriate and cooperative throughout examination Lungs:     Normal respiratory effort, chest expands symmetrically. Lungs are clear to auscultation, no crackles or wheezes. Heart:     normal rate, regular rhythm, and no murmur.   Extremities:     No clubbing, cyanosis, edema, or deformity noted with normal full range of motion of  all joints.   Psych:     Oriented X3, memory intact for recent and remote, and normally interactive.      Impression & Recommendations:  Problem # 1:  UNSPECIFIED BACTERIAL PNEUMONIA (ICD-482.9) recheck labs recheck CXR next week f/u here 2 weeks--- pt Pulse ox 97% off ox walking around office.  Pt will try to do without o2 at home but will keep it around and recheck in 2 weeks Orders: Venipuncture (30865) TLB-BMP (Basic Metabolic Panel-BMET) (80048-METABOL) TLB-CBC Platelet - w/Differential (85025-CBCD) T-2 View CXR (71020TC)   Problem # 2:  UTI (ICD-599.0) recheck urine Her updated medication list for this problem includes:    Enablex 7.5 Mg Tb24 (Darifenacin hydrobromide) .[name]... Take daily as directed  Orders: Venipuncture (78469) TLB-BMP (Basic Metabolic Panel-BMET) (80048-METABOL) TLB-CBC Platelet - w/Differential (85025-CBCD)   Problem # 3:  ANEMIA-IRON DEFICIENCY (ICD-280.9) check  labsHgb: 11.0 (09/05/2006)   Hct: 32.9 (09/05/2006)   RDW: 11.9 (09/05/2006)   MCV: 87.6 (09/05/2006)   MCHC: 33.3 (09/05/2006) TSH: 3.21 (03/11/2006)   Complete Medication List: 1)  Endocet 10-325 Mg Tabs (Oxycodone-acetaminophen) .... Take one tablet every 6-8 hours as needed for pain. 2)  Fentanyl 50 Mcg/hr Pt72 (Fentanyl) .... Change patch every three days 3)  Omeprazole 20 Mg Cpdr (Omeprazole) .... Take one tablet daily as directed 4)  Cardura 4 Mg Tabs (Doxazosin mesylate) .... Take one tablet daily 5)  Enablex 7.5 Mg Tb24 (Darifenacin hydrobromide) .... Take daily as directed 6)  Amitriptyline Hcl 25 Mg Tabs (Amitriptyline hcl) .... Take 2 tablets every evening at bedtime 7)  Celebrex 200 Mg Caps (Celecoxib) .... Take one tablet twice daily 8)  Temazepam 15 Mg Caps (Temazepam) .... Take one to two capsules at bedtime 9)  Cyclobenzaprine Hcl 10 Mg Tabs (Cyclobenzaprine hcl) .... Take one tablet three times daily 10)  Gabapentin 600 Mg Tabs (Gabapentin) .... Take one tablet 4 times  daily 11)  Mirapex 0.125 Mg Tabs (Pramipexole dihydrochloride) .[name].. 1 by mouth 2 hrs before bed time 12)  Norvasc 10 Mg Tabs (Amlodipine besylate) .[name].. 1 by mouth qd 13)  Promethazine Hcl 25 Mg Tabs (Promethazine hcl) .... Take one tablet 14-6 hrs as needed for nausea 14)  Fosamax 70 Mg Tabs (Alendronate sodium) .... Take as directed weekly 15)  Flecainide Acetate 50 Mg Tabs (Flecainide acetate)    ]

## 2010-04-18 NOTE — Progress Notes (Signed)
Summary: ENDOCET/FENTANYL//[facility]  Phone Note Refill Request Call back at Home Phone [phone] Message from:  Patient  Refills Requested: Medication #1:  ENDOCET 10-325 MG  TABS Take one tablet every 6-8 hours as needed for pain.  Medication #2:  FENTANYL 50 MCG/HR  PT72 change patch every three days LAST OV 06/03/08 LAST REFILL ENDOCET #120 X 0 ON 06/03/08, LAST REFILL FENTANY #10 X 0 ON 06/03/08   Method Requested: Pick up at Office Initial call taken by: Kandice Hams,  July 01, 2008 4:54 PM  Follow-up for Phone Call        ok to refill x1 Follow-up by: Loreen Freud DO,  July 01, 2008 5:17 PM  Additional Follow-up for Phone Call Additional follow up Details #1::        patient aware prescription ready..................Marland KitchenDoristine Devoid  July 02, 2008 10:14 AM       Prescriptions: FENTANYL 50 MCG/HR  PT72 (FENTANYL) change patch every three days  #10 x 0   Entered by:   Doristine Devoid   Authorized by:   Loreen Freud DO   Signed by:   Doristine Devoid on 07/02/2008   Method used:   Print then Give to Patient   RxID:   [id number] ENDOCET 10-325 MG  TABS (OXYCODONE-ACETAMINOPHEN) Take one tablet every 6-8 hours as needed for pain.  #120 x 0   Entered by:   Doristine Devoid   Authorized by:   Loreen Freud DO   Signed by:   Doristine Devoid on 07/02/2008   Method used:   Print then Give to Patient   RxID:   [id number]

## 2010-04-18 NOTE — Progress Notes (Signed)
Summary: cyclobenzaprine refill   Phone Note Refill Request Message from:  Fax from Pharmacy on November 29, 2009 3:50 PM  Refills Requested: Medication #1:  CYCLOBENZAPRINE HCL 10 MG  TABS Take one tablet three times daily Hayes Ludwig [phone]  Initial call taken by: Okey Regal Spring,  November 29, 2009 3:52 PM    Prescriptions: CYCLOBENZAPRINE HCL 10 MG  TABS (CYCLOBENZAPRINE HCL) Take one tablet three times daily  #90 x 2   Entered by:   Doristine Devoid CMA   Authorized by:   Loreen Freud DO   Signed by:   Doristine Devoid CMA on 11/29/2009   Method used:   Electronically to        [facility] (retail)       [address]       [place], [place]  [address]       Ph: [phone]       Fax: [phone]   RxID:   [id number]

## 2010-04-18 NOTE — Progress Notes (Signed)
Summary: [facility] MELOXICAM  Phone Note Refill Request   Refills Requested: Medication #1:  MELOXICAM 15 MG [facility]--[phone]  Initial call taken by: Freddy Jaksch,  October 04, 2008 3:02 PM  Follow-up for Phone Call        LAST REFILL #30 X 2 ON 07/14/08, LAST OV 06/03/08 Follow-up by: Kandice Hams,  October 04, 2008 4:46 PM  Additional Follow-up for Phone Call Additional follow up Details #1::        refill x1 5 refills-----  this is Mobic Additional Follow-up by: Loreen Freud DO,  October 04, 2008 4:52 PM    Prescriptions: MOBIC 15 MG TABS (MELOXICAM) take 1/2 - 1 by mouth once daily  #30 x 5   Entered by:   Jeremy Johann CMA   Authorized by:   Loreen Freud DO   Signed by:   Jeremy Johann CMA on 10/05/2008   Method used:   Faxed to ...       [facility] (retail)       [address]       [place], [place]  [address]       Ph: [phone]       Fax: [phone]   RxID:   [id number]

## 2010-04-18 NOTE — Assessment & Plan Note (Signed)
Summary: rto 3 months/cbs   Vital Signs:  Patient profile:   65 year old female Height:      62 inches Weight:      91 pounds Temp:     98.0 degrees F oral Pulse rate:   92 / minute BP sitting:   90 / 60  (left arm)  Vitals Entered By: Jeremy Johann CMA (November 16, 2008 10:55 AM) CC: 3 month f/u   History of Present Illness: Pt here to f/u bp.  Hypertension follow-up      This is a 65 year old woman who presents for Hypertension follow-up.  The patient denies lightheadedness, urinary frequency, headaches, edema, impotence, rash, and fatigue.  The patient denies the following associated symptoms: chest pain, chest pressure, exercise intolerance, dyspnea, palpitations, syncope, leg edema, and pedal edema.  Compliance with medications (by patient report) has been near 100%.  The patient reports that dietary compliance has been good.  The patient reports exercising occasionally.  Adjunctive measures currently used by the patient include salt restriction.   Pt c/o bruising easier than usual.  Current Medications (verified): 1)  Spiriva Handihaler 18 Mcg Caps (Tiotropium Bromide Monohydrate) .... One Puff Once Daily 2)  Ventolin Hfa 108 (90 Base) Mcg/act Aers (Albuterol Sulfate) .... Two Puffs Up To Four Times Per Day As Needed 3)  Endocet 10-325 Mg  Tabs (Oxycodone-Acetaminophen) .... Take One Tablet Every 6-8 Hours As Needed For Pain. 4)  Fentanyl 50 Mcg/hr  Pt72 (Fentanyl) .... Change Patch Every Three Days 5)  Mobic 15 Mg Tabs (Meloxicam) .... Take 1/2 - 1 By Mouth Once Daily 6)  Prilosec 40 Mg Cpdr (Omeprazole) .... Take 1 Tablet By Mouth Once A Day 7)  Cardura 4 Mg  Tabs (Doxazosin Mesylate) .... Take One Tablet Daily 8)  Norvasc 10 Mg  Tabs (Amlodipine Besylate) .[name].. 1 By Mouth Qd 9)  Flecainide Acetate 50 Mg  Tabs (Flecainide Acetate) .[name].. 1 Tablet By Mouth Every Morning and 1/2 Tablet Po Every Night 10)  Amitriptyline Hcl 25 Mg  Tabs (Amitriptyline Hcl) .... Take 2 Tablets Every  Evening At Bedtime As Needed 11)  Temazepam 15 Mg  Caps (Temazepam) .... Take One To Two Capsules At Bedtime 12)  Cyclobenzaprine Hcl 10 Mg  Tabs (Cyclobenzaprine Hcl) .... Take One Tablet Three Times Daily 13)  Gabapentin 600 Mg  Tabs (Gabapentin) .... Take One Tablet 4 Times Daily 14)  Promethazine Hcl 25 Mg  Tabs (Promethazine Hcl) .... Take One Tablet  Q 4-6 Hrs As Needed For Nausea 15)  Promethazine-Codeine 6.25-10 Mg/75ml Syrp (Promethazine-Codeine) .... 5 Ml By Mouth Every 4-6 Hours As Needed,   Allergies (verified): 1)  ! Talwin 2)  ! Sulfa  Past History:  Past medical, surgical, family and social histories (including risk factors) reviewed, and no changes noted (except as noted below).  Past Medical History: Reviewed history from 09/16/2008 and no changes required. Anemia-iron deficiency Hypertension Osteoarthritis GERD CAD Left bundle branch block Atrial fibrillation - Dr. Roger Shelter Chronic pain Fibromyalgia Diverticulosis, colon COPD/Emphysema      -06/28/08 PFT FEV1 1.95 (97%), FVC 2.79 (101%), FEV1% 70, TLC 3.88 (87%), DLCO 64% Pneumonia      - 03/09, 12/09, 02/10, 06/10 Exudative pleural effusion s/p right thoracentesis 03/09  Past Surgical History: Reviewed history from 05/12/2008 and no changes required. Intestinal resection (5times) Hysterectomy-complete Appendectomy Bladder lift Tonsillectomy Lysis of intestinal adhesions-numerous surgeries PICC Line TAH  Family History: Reviewed history from 05/12/2008 and no changes required. Family  History of Heart Disease: Father Family History of CVA: Mother No FH of Colon Cancer:  Social History: Reviewed history from 05/12/2008 and no changes required. Occupation: Disabled Alcohol Use - no Illicit Drug Use - no Patient is a former smoker.  Daily Caffeine Use  Review of Systems      See HPI  Physical Exam  General:  Well-developed,well-nourished,in no acute distress; alert,appropriate  and cooperative throughout examination Neck:  No deformities, masses, or tenderness noted. Lungs:  Normal respiratory effort, chest expands symmetrically. Lungs are clear to auscultation, no crackles or wheezes. Heart:  normal rate and no murmur.   Extremities:  No clubbing, cyanosis, edema, or deformity noted with normal full range of motion of all joints.     Impression & Recommendations:  Problem # 1:  ECCHYMOSES (ICD-782.9)  Orders: Venipuncture (62130) TLB-CBC Platelet - w/Differential (85025-CBCD) TLB-PT (Protime) (85610-PTP) TLB-PTT (85730-PTTL) TLB-Hepatic/Liver Function Pnl (80076-HEPATIC)  Problem # 2:  HYPERTENSION, BENIGN ESSENTIAL (ICD-401.1)  Her updated medication list for this problem includes:    Cardura 4 Mg Tabs (Doxazosin mesylate) .[name]... Take one tablet daily    Norvasc 10 Mg Tabs (Amlodipine besylate) .[name]... 1 by mouth qd  BP today: 90/60 Prior BP: 110/68 (09/16/2008)  Labs Reviewed: K+: 4.1 (09/01/2008) Creat: : 0.7 (09/01/2008)   Chol: 160 (12/09/2007)   HDL: 44.4 (12/09/2007)   LDL: 100 (12/09/2007)   TG: 76 (12/09/2007)  Complete Medication List: 1)  Spiriva Handihaler 18 Mcg Caps (Tiotropium bromide monohydrate) .... One puff once daily 2)  Ventolin Hfa 108 (90 Base) Mcg/act Aers (Albuterol sulfate) .... Two puffs up to four times per day as needed 3)  Endocet 10-325 Mg Tabs (Oxycodone-acetaminophen) .... Take one tablet every 6-8 hours as needed for pain. 4)  Fentanyl 50 Mcg/hr Pt72 (Fentanyl) .... Change patch every three days 5)  Mobic 15 Mg Tabs (Meloxicam) .... Take 1/2 - 1 by mouth once daily 6)  Prilosec 40 Mg Cpdr (Omeprazole) .... Take 1 tablet by mouth once a day 7)  Cardura 4 Mg Tabs (Doxazosin mesylate) .... Take one tablet daily 8)  Norvasc 10 Mg Tabs (Amlodipine besylate) .[name].. 1 by mouth qd 9)  Flecainide Acetate 50 Mg Tabs (Flecainide acetate) .[name].. 1 tablet by mouth every morning and 1/2 tablet po every night 10)  Amitriptyline Hcl 25  Mg Tabs (Amitriptyline hcl) .... Take 2 tablets every evening at bedtime as needed 11)  Temazepam 15 Mg Caps (Temazepam) .... Take one to two capsules at bedtime 12)  Cyclobenzaprine Hcl 10 Mg Tabs (Cyclobenzaprine hcl) .... Take one tablet three times daily 13)  Gabapentin 600 Mg Tabs (Gabapentin) .... Take one tablet 4 times daily 14)  Promethazine Hcl 25 Mg Tabs (Promethazine hcl) .... Take one tablet  q 4-6 hrs as needed for nausea 15)  Promethazine-codeine 6.25-10 Mg/16ml Syrp (Promethazine-codeine) .... 5 ml by mouth every 4-6 hours as needed,  Prescriptions: GABAPENTIN 600 MG  TABS (GABAPENTIN) Take one tablet 4 times daily  #120 x 5   Entered and Authorized by:   Loreen Freud DO   Signed by:   Loreen Freud DO on 11/16/2008   Method used:   Print then Give to Patient   RxID:   [id number] CYCLOBENZAPRINE HCL 10 MG  TABS (CYCLOBENZAPRINE HCL) Take one tablet three times daily  #90 x 3   Entered and Authorized by:   Loreen Freud DO   Signed by:   Loreen Freud DO on 11/16/2008   Method used:  Print then Give to Patient   RxID:   [id number] TEMAZEPAM 15 MG  CAPS (TEMAZEPAM) Take one to two capsules at bedtime  #60 x 2   Entered and Authorized by:   Loreen Freud DO   Signed by:   Loreen Freud DO on 11/16/2008   Method used:   Print then Give to Patient   RxID:   [id number]

## 2010-04-18 NOTE — Letter (Signed)
Summary: [facility]  [facility]   Imported By: Lanelle Bal 11/02/2008 10:21:22  _____________________________________________________________________  External Attachment:    Type:   Image     Comment:   External Document

## 2010-04-18 NOTE — Progress Notes (Signed)
Summary: lmtc-02-26-08//fld  Phone Note Outgoing Call   Summary of Call: left message to call  office.   pt is anemic--- MVI with iron daily    Initial call taken by: Jeremy Johann CMA,  February 26, 2008 2:13 PM  Follow-up for Phone Call        pt aware Follow-up by: [facility] CMA,  March 02, 2008 3:39 PM

## 2010-04-18 NOTE — Progress Notes (Signed)
   Phone Note Outgoing Call   Call placed by: rhonda Call placed to: Patient Details for Reason: pradaxa stopped-start coumadin Initial call taken by: [name] RN,  January 25, 2010 2:03 PM  Follow-up for Phone Call         spoke w/ pt and she wants to start coumadin after her procedure on 11/18. will call her then to get started. she had to stop pradaxa r/t esophagus ulcers.  Follow-up by: [name] RN,  January 25, 2010 2:05 PM

## 2010-04-18 NOTE — Progress Notes (Signed)
Summary: REFILL  Phone Note Refill Request Message from:  Pharmacy on [facility] FAX [phone]  Refills Requested: Medication #1:  TEMAZEPAM 15 MG  CAPS Take one to two capsules at bedtime Initial call taken by: Barb Merino,  September 02, 2008 9:48 AM  Follow-up for Phone Call        last filled #60 07-27-08, last OV 08-03-08...............Marland KitchenFelecia Deloach CMA  September 02, 2008 11:40 AM  Additional Follow-up for Phone Call Additional follow up Details #1::        ok to refill x1 Additional Follow-up by: Loreen Freud DO,  September 02, 2008 2:29 PM      Prescriptions: TEMAZEPAM 15 MG  CAPS (TEMAZEPAM) Take one to two capsules at bedtime  #60 x 0   Entered by:   Jeremy Johann CMA   Authorized by:   Loreen Freud DO   Signed by:   Jeremy Johann CMA on 09/02/2008   Method used:   Printed then faxed to ...       [facility] (retail)       [address]       [place], [place]  [id number]       Ph: [phone]       Fax: [phone]   RxID:   [id number]

## 2010-04-18 NOTE — Progress Notes (Signed)
Summary: Refill Request  Phone Note Refill Request Message from:  Pharmacy on [facility] Fax #: [phone]  Refills Requested: Medication #1:  TEMAZEPAM 15 MG  CAPS Take one to two capsules at bedtime   Dosage confirmed as above?Dosage Confirmed   Supply Requested: 1 month   Last Refilled: 05/09/2009 Next Appointment Scheduled: 6.16.11 Initial call taken by: Harold Barban,  June 09, 2009 8:58 AM  Follow-up for Phone Call        last ov- 05/31/09. Army Fossa CMA  June 09, 2009 9:07 AM   Additional Follow-up for Phone Call Additional follow up Details #1::        ok to refeill x1--  2 refills Additional Follow-up by: Loreen Freud DO,  June 09, 2009 9:57 AM    Prescriptions: TEMAZEPAM 15 MG  CAPS (TEMAZEPAM) Take one to two capsules at bedtime  #60 x 2   Entered by:   Army Fossa CMA   Authorized by:   Loreen Freud DO   Signed by:   Army Fossa CMA on 06/09/2009   Method used:   Printed then faxed to ...       [facility] (retail)       [address]       [place], [place]  [address]       Ph: [phone]       Fax: [phone]   RxID:   [id number]

## 2010-04-18 NOTE — Progress Notes (Signed)
Summary: Refill Request  Phone Note Refill Request Call back at [phone] Message from:  Pharmacy on July 13, 2009 10:43 AM  Refills Requested: Medication #1:  MELOXICAM 15MG  TAB, take 1/2 to 1 tablet daily   Dosage confirmed as above?Dosage Confirmed   Supply Requested: 1 month   Last Refilled: 06/21/2009 [facility]  Next Appointment Scheduled: 6.16.11 Initial call taken by: Harold Barban,  July 13, 2009 10:44 AM  Follow-up for Phone Call        last refill- 04/11/09 x 2 last ov- 05/2009 Army Fossa CMA  July 13, 2009 10:48 AM   Additional Follow-up for Phone Call Additional follow up Details #1::        ok x1 Additional Follow-up by: Loreen Freud DO,  July 13, 2009 11:09 AM    Prescriptions: MOBIC 15 MG TABS (MELOXICAM) take 1/2 - 1 by mouth once daily  #30 x 0   Entered by:   Army Fossa CMA   Authorized by:   Loreen Freud DO   Signed by:   Army Fossa CMA on 07/13/2009   Method used:   Electronically to        [facility] (retail)       [address]       [place], [place]  [address]       Ph: [phone]       Fax: [phone]   RxID:   [id number]

## 2010-04-18 NOTE — Progress Notes (Signed)
Summary: REFILL  Phone Note Refill Request Message from:  Patient on May 12, 2009 8:34 AM  Refills Requested: Medication #1:  FENTANYL 50 MCG/HR  PT72 change patch every three days OXYCODENE 10-325MG    Method Requested: Pick up at Office Next Appointment Scheduled: 05/31/2009 Initial call taken by: Barb Merino,  May 12, 2009 8:36 AM  Follow-up for Phone Call        last ov- 02/15/2009- both filled on 04/15/2009. Army Fossa CMA  May 12, 2009 9:18 AM   Additional Follow-up for Phone Call Additional follow up Details #1::        ok to refill x1 Additional Follow-up by: Loreen Freud DO,  May 12, 2009 11:25 AM    Additional Follow-up for Phone Call Additional follow up Details #2::    Pt is aware rx is ready. Army Fossa CMA  May 12, 2009 11:36 AM   Prescriptions: FENTANYL 50 MCG/HR  PT72 (FENTANYL) change patch every three days  #10 x 0   Entered by:   Army Fossa CMA   Authorized by:   Loreen Freud DO   Signed by:   Army Fossa CMA on 05/12/2009   Method used:   Print then Give to Patient   RxID:   [id number] ENDOCET 10-325 MG  TABS (OXYCODONE-ACETAMINOPHEN) Take one tablet every 6-8 hours as needed for pain.  #120 x 0   Entered by:   Army Fossa CMA   Authorized by:   Loreen Freud DO   Signed by:   Army Fossa CMA on 05/12/2009   Method used:   Print then Give to Patient   RxID:   [id number]

## 2010-04-18 NOTE — Assessment & Plan Note (Signed)
Summary: F/U APPT...& MONTHLY B12 SHOT...LSW.    History of Present Illness Visit Type: Follow-up Visit Primary GI MD: [name] MD Primary Provider: Loreen Freud, DO Chief Complaint: gi bleeding, low hemoglobin, post hospital History of Present Illness:   This is a 65 year old white female with chronic low-grade small bowel obstruction. She is status post recent hospitalization for an upper GI bleed due to erosive esophagitis attributed to Pradaxa and esophageal ulcers documented on an upper endoscopy  by Dr Leone Payor. The ulcerations were attributed to Pradaxa which has been discontinued. She is status post permanent transvenous pacemaker and status post Port-A-Cath placement. She required blood transfusions for an initial hemoglobin of 6.9 g. She left the hospital with a hemoglobin of 9.8. She denies any further bleeding. She continues to have abdominal pain, distention and intermittent diarrhea. She uses her PEG tube for decompression only and drinks Resource 2 cans a day. There has been irritation and burning around the tube which is difficult to clean. There has been a small amount of drainage around the tube. She would like to be switched to a button PEG to avoid continuous dangling of the tube. She has gained about 8 pounds since her discharge from the hospital to a currentl 94 pounds.   GI Review of Systems    Reports abdominal pain, acid reflux, loss of appetite, and  nausea.      Denies belching, bloating, chest pain, dysphagia with liquids, dysphagia with solids, heartburn, vomiting, vomiting blood, weight loss, and  weight gain.        Denies anal fissure, black tarry stools, change in bowel habit, constipation, diarrhea, diverticulosis, fecal incontinence, heme positive stool, hemorrhoids, irritable bowel syndrome, jaundice, light color stool, liver problems, rectal bleeding, and  rectal pain.    Current Medications (verified): 1)  Foradil Aerolizer 12 Mcg Caps (Formoterol  Fumarate) .... One Puff Two Times A Day 2)  Spiriva Handihaler 18 Mcg Caps (Tiotropium Bromide Monohydrate) .... One Puff Once Daily 3)  Ventolin Hfa 108 (90 Base) Mcg/act Aers (Albuterol Sulfate) .... Two Puffs Up To Four Times Per Day As Needed 4)  Endocet 10-325 Mg  Tabs (Oxycodone-Acetaminophen) .... Take One Tablet Every 6-8 Hours As Needed For Pain. 5)  Fentanyl 50 Mcg/hr  Pt72 (Fentanyl) .... Change Patch Every Three Days 6)  Mobic 15 Mg Tabs (Meloxicam) .... Take 1/2-1 Tablet Daily 7)  Pantoprazole Sodium 40 Mg Tbec (Pantoprazole Sodium) .... Take 1 Tablet By Mouth Once A Day 8)  Temazepam 15 Mg  Caps (Temazepam) .... Take One To Two Capsules At Bedtime 9)  Cyclobenzaprine Hcl 10 Mg  Tabs (Cyclobenzaprine Hcl) .... Take One Tablet Three Times Daily 10)  Gabapentin 600 Mg  Tabs (Gabapentin) .... Take One Tablet 4 Times Daily 11)  Zofran 8 Mg Tabs (Ondansetron Hcl) .[name].. 1 By Mouth Q12 H As Needed 12)  Resource Breeze  Liqd (Nutritional Supplements) .... Take 1 Can (250 Calories/can) Two Times A Day Per Peg Tube. Dx: 536.3-Gastroparesis, 787.01-Nausea and Vomiting 13)  Reglan 5 Mg Tabs (Metoclopramide Hcl) .... Take 1 Tablet By Mouth Three Times A Day Before Meals 14)  Aspirin 81 Mg Tbec (Aspirin) .[name].. 1 By Mouth Qd 15)  Furosemide 20 Mg Tabs (Furosemide) .[name].. 1 By Mouth Qd 16)  Nitrostat 0.4 Mg Subl (Nitroglycerin) .... By Mouth 17)  Amiodarone Hcl 200 Mg Tabs (Amiodarone Hcl) .[name].. 1 By Mouth Daily 18)  Carvedilol 6.25 Mg Tabs (Carvedilol) .... 2 By Mouth Daily 19)  Metroclopramide 5 Mg .Marland KitchenMarland KitchenMarland Kitchen  By Mouth Three Times A Day  Allergies: 1)  ! Talwin 2)  ! Sulfa 3)  ! * Pradaxa  Past History:  Past Medical History: Reviewed history from 11/15/2009 and no changes required. Anemia-iron deficiency CHRONIC PARTIAL SMALL BOWEL OBSTRUCTION Hypertension Osteoarthritis GERD CAD Left bundle branch block Atrial fibrillation - Dr. Roger Shelter Chronic pain Fibromyalgia Diverticulosis,  colon COPD/Emphysema      -06/28/08 PFT FEV1 1.95 (97%), FVC 2.79 (101%), FEV1% 70, TLC 3.88 (87%), DLCO 64% Pneumonia      - 03/09, 12/09, 02/10, 06/10 Exudative pleural effusion s/p right thoracentesis 03/09 RLL pneumonia 10/26/2009,  sepsis picc line, UTI,   hospitalization [place]  Past Surgical History: Reviewed history from 01/06/2010 and no changes required. Intestinal resection (5times),S/P PARTIAL COLECTOMY 2001 Hysterectomy-complete Appendectomy Bladder lift Tonsillectomy Lysis of intestinal adhesions-numerous surgeries/LAST 2003 PICC Line TAH Intestinal surgery again 04/27/2009 06/2009--changed colostomy tube---Dr Dwain Sarna Permanent pacemaker (12/22/2009)  Family History: Reviewed history from 05/12/2008 and no changes required. Family History of Heart Disease: Father Family History of CVA: Mother No FH of Colon Cancer:  Social History: Reviewed history from 04/07/2009 and no changes required. Occupation: Disabled Alcohol Use - no Illicit Drug Use - no Patient is a former smoker, quit 1988 x26yrs, <1ppd.  Daily Caffeine Use  Review of Systems       The patient complains of anemia, arthritis/joint pain, fatigue, headaches-new, and sleeping problems.  The patient denies allergy/sinus, anxiety-new, back pain, blood in urine, breast changes/lumps, change in vision, confusion, cough, coughing up blood, depression-new, fainting, fever, hearing problems, heart murmur, heart rhythm changes, itching, menstrual pain, muscle pains/cramps, night sweats, nosebleeds, pregnancy symptoms, shortness of breath, skin rash, sore throat, swelling of feet/legs, swollen lymph glands, thirst - excessive, urination - excessive, urination changes/pain, urine leakage, vision changes, and voice change.         Pertinent positive and negative review of systems were noted in the above HPI. All other ROS was otherwise negative.   Vital Signs:  Patient profile:   65 year old female Height:      62  inches Weight:      93.13 pounds BMI:     17.10 Pulse rate:   80 / minute Pulse rhythm:   regular BP sitting:   110 / 78  (left arm) Cuff size:   regular  Vitals Entered By: June McMurray CMA Duncan Dull) (January 23, 2010 1:43 PM)  Physical Exam  General:  Well developed, well nourished, no acute distress. Eyes:  PERRLA, no icterus. Mouth:  No deformity or lesions, dentition normal. Neck:  Supple; no masses or thyromegaly. Chest Wall:  Port-A-Cath in the right upper quadrant of the chest and permanent transvenous pacemaker in the left upper quadrant with no visible swelling vasodilatation of the colon at length or swelling of her left arm Lungs:  Clear throughout to auscultation. Heart:  Regular rate and rhythm; no murmurs, rubs,  or bruits. Abdomen:  tight, distended and diffusely tender. Abnormal bowel sounds with high-pitched rushes consistent with low-grade obstruction. Gastrostomy in left upper quadrant filled up with back Tube feedings. Skin around the adapter is red, erythematous and weeping. Extremities:  No clubbing, cyanosis, edema or deformities noted. Skin:  The gastrostomy appears irritated and is weeping. Psych:  Alert and cooperative. Normal mood and affect.   Impression & Recommendations:  Problem # 1:  GERD (ICD-530.81) Patient has GERD secondary to a low-grade small bowel obstruction. She is to continue pantoprazole 40 mg daily and p.r.n. twice a  day.  Problem # 2:  GASTROPARESIS (ICD-536.3) Patient has gastroparesis due to chronic small bowel obstructions. She uses her gastrostomy for decompression.  Orders: ZPEG Change (ZPEG Change)  Other Orders: TLB-CBC Platelet - w/Differential (85025-CBCD) TLB-IBC Pnl (Iron/FE;Transferrin) (83550-IBC) Vit B12 1000 mcg (U9811)  Patient Instructions: 1)  You have been scheduled for a change of gastrostomy tube to a button gastrostomy. 2)  Continue pantoprazole 40 mg once or twice a day. 3)  Continue resource, one can  twice a day. 4)  Your physician requests that you go to the basement floor of our office to have the following labwork completed before leaving today: CBC with differential and iron studies. 5)  Resume Reglan. 6)  Please pick up your prescriptions at the pharmacy. Electronic prescription(s) has already been sent for Diflucan 100 mg p.o. q.d. x 3 days and Reglan. 7)  Vitamin B12 1,000 mcg has been given to you IM today. 8)  Patient is not ready to have her gastrostomy removed. 9)  We have contacted Dan Humphreys, the ostomy nurse for a skin care consult of your PEG site at the time of your PEG change. 10)  Copy sent to : Dr Dwain Sarna, Dr Laury Axon 11)  The medication list was reviewed and reconciled.  All changed / newly prescribed medications were explained.  A complete medication list was provided to the patient / caregiver. Prescriptions: REGLAN 5 MG TABS (METOCLOPRAMIDE HCL) Take 1 tablet by mouth three times a day before meals  #90 x 6   Entered by:   Lamona Curl CMA (AAMA)   Authorized by:   Hart Carwin MD   Signed by:   Lamona Curl CMA (AAMA) on 01/23/2010   Method used:   Electronically to        [facility] (retail)       [address]       [place], [place]  [address]       Ph: [phone]       Fax: [phone]   RxID:   [id number] DIFLUCAN 100 MG TABS (FLUCONAZOLE) Take 1 tablet by mouth once a day x 3 days  #3 x 0   Entered by:   Lamona Curl CMA (AAMA)   Authorized by:   Hart Carwin MD   Signed by:   Lamona Curl CMA (AAMA) on 01/23/2010   Method used:   Electronically to        [facility] (retail)       [address]       [place], [place]  [address]       Ph: [phone]       Fax: [phone]   RxID:   [id number]    Medication Administration  Injection # 1:    Medication: Vit B12 1000 mcg    Diagnosis: B12 DEFICIENCY (ICD-266.2)    Route: IM    Site: R deltoid     Exp Date: 10/18/2011    Lot #: [id number]    Mfr: [employer]    Comments: Monthly injection    Patient tolerated injection without complications    Given by: June McMurray CMA Duncan Dull) (January 23, 2010 2:54 PM)  Orders Added: 1)  TLB-CBC Platelet - w/Differential [85025-CBCD] 2)  TLB-IBC Pnl (Iron/FE;Transferrin) [83550-IBC] 3)  ZPEG Change [ZPEG Change] 4)  Vit B12 1000 mcg [J3420]

## 2010-04-18 NOTE — Letter (Signed)
Summary: Results Follow up Letter  [facility]  [address] [place], [place] [address]   Phone: [phone]  Fax: [phone]    12/30/2008 MRN: [id number]  KHANDI KERNES [address] [place], [place]  [address]  Dear Ms. Imran,  The following are the results of your recent test(s):  Test         Result    Pap Smear:        Normal _____  Not Normal _____ Comments: ______________________________________________________ Cholesterol: LDL(Bad cholesterol):         Your goal is less than:         HDL (Good cholesterol):       Your goal is more than: Comments:  ______________________________________________________ Mammogram:        Normal _____  Not Normal _____ Comments:  ___________________________________________________________________ Hemoccult:        Normal _____  Not normal _______ Comments:    _____________________________________________________________________ Other Tests:  See attachment for results.   We routinely do not discuss normal results over the telephone.  If you desire a copy of the results, or you have any questions about this information we can discuss them at your next office visit.   Sincerely,    Army Fossa CMA  December 30, 2008 3:14 PM

## 2010-04-18 NOTE — Progress Notes (Signed)
Summary: lowne-refill  Phone Note Refill Request Call back at Home Phone [phone]   Refills Requested: Medication #1:  ENDOCET 10-325 MG  TABS Take one tablet every 6-8 hours as needed for pain.  Medication #2:  FENTANYL 50 MCG/HR  PT72 change patch every three days last OV 11-16-08... last filled 12-14-08 #120 oxycodine, fentanyl #10....................Marland KitchenFelecia Deloach CMA  January 12, 2009 5:03 PM   Initial call taken by: Jeremy Johann CMA,  January 12, 2009 5:02 PM  Follow-up for Phone Call        ok to refill x1 each Follow-up by: Loreen Freud DO,  January 12, 2009 10:18 PM  Additional Follow-up for Phone Call Additional follow up Details #1::        pt aware rx ready for pick-up..................Marland KitchenFelecia Deloach CMA  January 13, 2009 8:51 AM     Prescriptions: FENTANYL 50 MCG/HR  PT72 (FENTANYL) change patch every three days  #10 x 0   Entered by:   Jeremy Johann CMA   Authorized by:   Loreen Freud DO   Signed by:   Jeremy Johann CMA on 01/13/2009   Method used:   Print then Give to Patient   RxID:   [id number] ENDOCET 10-325 MG  TABS (OXYCODONE-ACETAMINOPHEN) Take one tablet every 6-8 hours as needed for pain.  #120 x 0   Entered by:   Jeremy Johann CMA   Authorized by:   Loreen Freud DO   Signed by:   Jeremy Johann CMA on 01/13/2009   Method used:   Print then Give to Patient   RxID:   [id number]

## 2010-04-18 NOTE — Assessment & Plan Note (Signed)
Summary: MONTHLY B-12 SHOT...LSW.  Nurse Visit   Allergies: 1)  ! Talwin 2)  ! Sulfa  Medication Administration  Injection # 1:    Medication: Vit B12 1000 mcg    Diagnosis: ANEMIA-IRON DEFICIENCY (ICD-280.9)    Route: IM    Site: L deltoid    Exp Date: 07/18/2010    Lot #: [id number]    Mfr: [employer]    Patient tolerated injection without complications    Given by: Harlow Mares CMA (AAMA) (November 15, 2008 10:42 AM)

## 2010-04-18 NOTE — Assessment & Plan Note (Signed)
Summary: ROV 3 MONTHS///KP   Copy to:  Loreen Freud, MD Primary Provider/Referring Provider:  Loreen Freud, DO  CC:  COPD.  Patient says she does have an occasional cough..  History of Present Illness: 65  yo female with history of COPD and recurrent pneumonia.  She was treated for a COPD exacerbation in November.  She improved after antibiotic therapy.    She has occasional cough with minimal sputum.  She gets occasional discomfort in her right lower chest.  This does not happen all the time.  She felt foradil helped, but she did not get her script filled.  She has not been using ventolin that much.   Current Medications (verified): 1)  Spiriva Handihaler 18 Mcg Caps (Tiotropium Bromide Monohydrate) .... One Puff Once Daily 2)  Ventolin Hfa 108 (90 Base) Mcg/act Aers (Albuterol Sulfate) .... Two Puffs Up To Four Times Per Day As Needed 3)  Endocet 10-325 Mg  Tabs (Oxycodone-Acetaminophen) .... Take One Tablet Every 6-8 Hours As Needed For Pain. 4)  Fentanyl 50 Mcg/hr  Pt72 (Fentanyl) .... Change Patch Every Three Days 5)  Mobic 15 Mg Tabs (Meloxicam) .... Take 1/2 - 1 By Mouth Once Daily 6)  Prilosec 40 Mg Cpdr (Omeprazole) .... Take 1 Tablet By Mouth Once A Day 7)  Norvasc 10 Mg  Tabs (Amlodipine Besylate) .[name].. 1 By Mouth Qd 8)  Flecainide Acetate 50 Mg  Tabs (Flecainide Acetate) .[name].. 1 Tablet By Mouth Every Morning and 1/2 Tablet Po Every Night 9)  Temazepam 15 Mg  Caps (Temazepam) .... Take One To Two Capsules At Bedtime 10)  Cyclobenzaprine Hcl 10 Mg  Tabs (Cyclobenzaprine Hcl) .... Take One Tablet Three Times Daily 11)  Gabapentin 600 Mg  Tabs (Gabapentin) .... Take One Tablet 4 Times Daily 12)  Promethazine-Codeine 6.25-10 Mg/19ml Syrp (Promethazine-Codeine) .... 5 Ml By Mouth Every 4-6 Hours As Needed,  13)  Zofran 8 Mg Tabs (Ondansetron Hcl) .[name].. 1 By Mouth Q12 H As Needed  Allergies (verified): 1)  ! Talwin 2)  ! Sulfa  Past History:  Past Medical  History: Reviewed history from 02/17/2009 and no changes required. Anemia-iron deficiency CHRONIC PARTIAL SMALL BOWEL OBSTRUCTION Hypertension Osteoarthritis GERD CAD Left bundle branch block Atrial fibrillation - Dr. Roger Shelter Chronic pain Fibromyalgia Diverticulosis, colon COPD/Emphysema      -06/28/08 PFT FEV1 1.95 (97%), FVC 2.79 (101%), FEV1% 70, TLC 3.88 (87%), DLCO 64% Pneumonia      - 03/09, 12/09, 02/10, 06/10 Exudative pleural effusion s/p right thoracentesis 03/09  Past Surgical History: Reviewed history from 02/17/2009 and no changes required. Intestinal resection (5times),S/P PARTIAL COLECTOMY 2001 Hysterectomy-complete Appendectomy Bladder lift Tonsillectomy Lysis of intestinal adhesions-numerous surgeries/LAST 2003 PICC Line TAH  Family History: Reviewed history from 05/12/2008 and no changes required. Family History of Heart Disease: Father Family History of CVA: Mother No FH of Colon Cancer:  Social History: Reviewed history from 05/12/2008 and no changes required. Occupation: Disabled Alcohol Use - no Illicit Drug Use - no Patient is a former smoker.  Daily Caffeine Use  Vital Signs:  Patient profile:   65 year old female Height:      62 inches (157.48 cm) Weight:      90.38 pounds (41.08 kg) BMI:     16.59 O2 Sat:      96 % on Room air Temp:     98.0 degrees F (36.67 degrees C) oral Pulse rate:   97 / minute BP sitting:   90 / 58  (  right arm) Cuff size:   regular  Vitals Entered By: Michel Bickers CMA (March 07, 2009 11:06 AM)  O2 Flow:  Room air  Physical Exam  General:  thin.   Nose:  no deformity, discharge, inflammation, or lesions Mouth:  no deformity or lesions Neck:  no JVD.   Lungs:  decreased breath sounds, no wheezing or rales Heart:  regular rhythm, normal rate, and no murmurs.   Abdomen:  thin, soft, no masses, non-tender, normal bowel sounds Extremities:  no edema, PICC line in left arm Cervical Nodes:  no  significant adenopathy   Impression & Recommendations:  Problem # 1:  COPD (ICD-496) Will restart her on foradil.  She is to continue on spiriva and as needed ventolin.  Medications Added to Medication List This Visit: 1)  Foradil Aerolizer 12 Mcg Caps (Formoterol fumarate) .... One puff two times a day  Complete Medication List: 1)  Spiriva Handihaler 18 Mcg Caps (Tiotropium bromide monohydrate) .... One puff once daily 2)  Ventolin Hfa 108 (90 Base) Mcg/act Aers (Albuterol sulfate) .... Two puffs up to four times per day as needed 3)  Endocet 10-325 Mg Tabs (Oxycodone-acetaminophen) .... Take one tablet every 6-8 hours as needed for pain. 4)  Fentanyl 50 Mcg/hr Pt72 (Fentanyl) .... Change patch every three days 5)  Mobic 15 Mg Tabs (Meloxicam) .... Take 1/2 - 1 by mouth once daily 6)  Prilosec 40 Mg Cpdr (Omeprazole) .... Take 1 tablet by mouth once a day 7)  Norvasc 10 Mg Tabs (Amlodipine besylate) .[name].. 1 by mouth qd 8)  Flecainide Acetate 50 Mg Tabs (Flecainide acetate) .[name].. 1 tablet by mouth every morning and 1/2 tablet po every night 9)  Temazepam 15 Mg Caps (Temazepam) .... Take one to two capsules at bedtime 10)  Cyclobenzaprine Hcl 10 Mg Tabs (Cyclobenzaprine hcl) .... Take one tablet three times daily 11)  Gabapentin 600 Mg Tabs (Gabapentin) .... Take one tablet 4 times daily 12)  Promethazine-codeine 6.25-10 Mg/77ml Syrp (Promethazine-codeine) .... 5 ml by mouth every 4-6 hours as needed,  13)  Zofran 8 Mg Tabs (Ondansetron hcl) .[name].. 1 by mouth q12 h as needed 14)  Foradil Aerolizer 12 Mcg Caps (Formoterol fumarate) .... One puff two times a day  Other Orders: Est. Patient Level III (53664)  Patient Instructions: 1)  Foradil one puff two times a day  2)  Spiriva one puff once daily  3)  Follow up in 3 to 4 months Prescriptions: SPIRIVA HANDIHALER 18 MCG CAPS (TIOTROPIUM BROMIDE MONOHYDRATE) one puff once daily  #30 x 6   Entered and Authorized by:   Coralyn Helling  MD   Signed by:   Coralyn Helling MD on 03/07/2009   Method used:   Electronically to        [facility] (retail)       [address]       [place], [place]  [address]       Ph: [phone]       Fax: [phone]   RxID:   [id number] FORADIL AEROLIZER 12 MCG CAPS (FORMOTEROL FUMARATE) one puff two times a day  #60 x 6   Entered and Authorized by:   Coralyn Helling MD   Signed by:   Coralyn Helling MD on 03/07/2009   Method used:   Electronically to        [facility] (retail)       [address]  [place], [place]  [address]       Ph: [phone]       Fax: [phone]   RxID:   [id number]

## 2010-04-18 NOTE — Letter (Signed)
Summary: [facility]   Imported By: Lanelle Bal 02/11/2008 10:27:21  _____________________________________________________________________  External Attachment:    Type:   Image     Comment:   External Document

## 2010-04-18 NOTE — Progress Notes (Signed)
Summary: CYCLOBENZAPRINE REFILL/see Dr Laury Axon  Phone Note Refill Request Message from:  Fax from Pharmacy on October 03, 2007 10:18 AM  Refills Requested: Medication #1:  CYCLOBENZAPRINE HCL 10 MG  TABS Take one tablet three times daily [facility]-FAX:[phone]  Initial call taken by: Doristine Devoid,  October 03, 2007 10:19 AM  Follow-up for Phone Call        PT SEEN 09/08/07 GIVEN CELEBREX, ADVISE PLEASE ON THIS RX LAST FILLED 2008 #90 2 RF.Kandice Hams  October 03, 2007 2:35 PM  Follow-up by: Kandice Hams,  October 03, 2007 2:35 PM  Additional Follow-up for Phone Call Additional follow up Details #1::        ok x1 Additional Follow-up by: Loreen Freud DO,  October 03, 2007 3:56 PM      Prescriptions: CYCLOBENZAPRINE HCL 10 MG  TABS (CYCLOBENZAPRINE HCL) Take one tablet three times daily  #90 x 0   Entered by:   Kandice Hams   Authorized by:   Loreen Freud DO   Signed by:   Kandice Hams on 10/03/2007   Method used:   Faxed to ...       [facility]       [address]       [place], [place]  [address]       Ph: [phone]       Fax: [phone]   RxID:   [id number]

## 2010-04-18 NOTE — Progress Notes (Signed)
   Phone Note Other Incoming   Caller: Jill with [facility] endo Summary of Call: Noreene Larsson at [facility] endo called to let us know the button for Ms. Viswanathan should be in tomorrow. She says we can schedule  her late next week or the week after. Please, advise. Initial call taken by: Jesse Fall RN,  February 07, 2010 9:41 AM  Follow-up for Phone Call        Thank You. Please call Mrs Lesh when the button arrives  and schedule the replacement accordingly. Thanx BD Follow-up by: Hart Carwin MD,  February 08, 2010 8:38 AM  Additional Follow-up for Phone Call Additional follow up Details #1::        Dr. Juanda Chance, Your next hospital week in 12/27. Do you want me to try to schedule on a Friday? Additional Follow-up by: Jesse Fall RN,  February 08, 2010 9:38 AM    Additional Follow-up for Phone Call Additional follow up Details #2::    we will be able to do it before that, just tell me when the Tomma Lightning becomes available and we will discuss it. I could do it on Wed afternoon off. Follow-up by: Hart Carwin MD,  February 08, 2010 1:25 PM  Additional Follow-up for Phone Call Additional follow up Details #3:: Details for Additional Follow-up Action Taken: Spoke with Debbie at [facility] endo. The button has not arrived yet.Jesse Fall RN  February 13, 2010 3:34 PM Debbie with Endo called to let us know the button came in. They received a 49 F not a 51 F. They do not make a 60 F. Please advise, on scheduling date to change button. Please schedule for me to place the button next Wed, Dec 7, after 1.00 pm. at [facility]. Thanx Scheduled patient at [facility] endo on 02/22/10 at [facility] with Kalman Shan number is [phone]. Dr. Juanda Chance aware and patient aware. Instruction letter mailed to patient. Additional Follow-up by: Jesse Fall RN,  February 14, 2010 9:13 AM   Appended Document:     Clinical Lists Changes  Orders: Added new Test order of ZEGD (ZEGD) - Signed

## 2010-04-18 NOTE — Assessment & Plan Note (Signed)
Summary: ROA 3 MONTHS   Vital Signs:  Patient profile:   65 year old female Height:      62 inches Weight:      95.25 pounds Temp:     98.1 degrees F oral Pulse rate:   86 / minute Pulse rhythm:   regular Resp:     16 per minute BP sitting:   110 / 68  (right arm)  Vitals Entered By: Ardyth Man (Aug 03, 2008 10:59 AM) CC: 3 month office visit Is Patient Diabetic? No Pain Assessment Patient in pain? no       Have you ever been in a relationship where you felt threatened, hurt or afraid?No   Does patient need assistance? Functional Status Self care Ambulation Normal   History of Present Illness: Pt here for 3 mo f/u.  Hypertension follow-up      This is a 65 year old woman who presents for Hypertension follow-up.  The patient complains of fatigue, but denies lightheadedness, urinary frequency, headaches, edema, impotence, and rash.  The patient denies the following associated symptoms: chest pain, chest pressure, exercise intolerance, dyspnea, palpitations, syncope, leg edema, and pedal edema.  Compliance with medications (by patient report) has been near 100%.  The patient reports that dietary compliance has been good.  The patient reports exercising daily.  Adjunctive measures currently used by the patient include salt restriction.    Allergies: 1)  ! Talwin 2)  ! Sulfa  Past History:  Past medical, surgical, family and social histories (including risk factors) reviewed, and no changes noted (except as noted below).  Past Medical History:    Reviewed history from 07/29/2008 and no changes required:    Anemia-iron deficiency    Hypertension    Osteoarthritis    GERD    CAD    Left bundle branch block    Atrial fibrillation - Dr. Roger Shelter    Chronic pain    Fibromyalgia    Diverticulosis, colon    COPD/Emphysema         -06/28/08 PFT FEV1 1.95 (97%), FVC 2.79 (101%), FEV1% 70, TLC 3.88 (87%), DLCO 64%    Pneumonia         - 03/09, 12/09, 02/10  Exudative pleural effusion s/p right thoracentesis 03/09  Past Surgical History:    Reviewed history from 05/12/2008 and no changes required:    Intestinal resection (5times)    Hysterectomy-complete    Appendectomy    Bladder lift    Tonsillectomy    Lysis of intestinal adhesions-numerous surgeries    PICC Line    TAH  Family History:    Reviewed history from 05/12/2008 and no changes required:       Family History of Heart Disease: Father       Family History of CVA: Mother       No FH of Colon Cancer:         Social History:    Reviewed history from 05/12/2008 and no changes required:       Occupation: Disabled       Alcohol Use - no       Illicit Drug Use - no       Patient is a former smoker.        Daily Caffeine Use         Review of Systems      See HPI  Physical Exam  General:  Well-developed,well-nourished,in no acute distress; alert,appropriate and cooperative throughout examination Lungs:  Normal  respiratory effort, chest expands symmetrically. Lungs are clear to auscultation, no crackles or wheezes. Heart:  normal rate, regular rhythm, and no murmur.   Psych:  Cognition and judgment appear intact. Alert and cooperative with normal attention span and concentration. No apparent delusions, illusions, hallucinations   Impression & Recommendations:  Problem # 1:  OSTEOPOROSIS (ICD-733.00)  The following medications were removed from the medication list:    Vitamin D 82956 Unit Caps (Ergocalciferol) .[name]... Take one tablet weekly  Problem # 2:  HYPERTENSION, BENIGN ESSENTIAL (ICD-401.1)  Her updated medication list for this problem includes:    Cardura 4 Mg Tabs (Doxazosin mesylate) .[name]... Take one tablet daily    Norvasc 10 Mg Tabs (Amlodipine besylate) .[name]... 1 by mouth qd  BP today: 110/68 Prior BP: 120/74 (07/29/2008)  Labs Reviewed: K+: 4.4 (06/28/2008) Creat: : 0.8 (06/28/2008)   Chol: 160 (12/09/2007)   HDL: 44.4 (12/09/2007)   LDL: 100 (12/09/2007)    TG: 76 (12/09/2007)  Problem # 3:  COPD (ICD-496) per pulm Her updated medication list for this problem includes:    Spiriva Handihaler 18 Mcg Caps (Tiotropium bromide monohydrate) ..... One puff once daily    Ventolin Hfa 108 (90 Base) Mcg/act Aers (Albuterol sulfate) .[name]..[name] Two puffs up to four times per day as needed  Pulmonary Functions Reviewed: FEV1: 1.95 (06/28/2008)   FEV 25-75: 0.99 (06/28/2008)   O2 sat: 94 (07/29/2008)     Vaccines Reviewed: Flu Vax: Fluvax 3+ (12/08/2007)  Problem # 4:  CHRONIC PAIN SYNDROME (ICD-338.4)  Problem # 5:  FIBROMYALGIA (ICD-729.1)  Her updated medication list for this problem includes:    Endocet 10-325 Mg Tabs (Oxycodone-acetaminophen) .[name]... Take one tablet every 6-8 hours as needed for pain.    Fentanyl 50 Mcg/hr Pt72 (Fentanyl) .[name]... Change patch every three days    Mobic 15 Mg Tabs (Meloxicam) .[name]... Take 1/2 - 1 by mouth once daily    Cyclobenzaprine Hcl 10 Mg Tabs (Cyclobenzaprine hcl) .[name]... Take one tablet three times daily  Problem # 6:  HYPERTENSION (ICD-401.9)  Her updated medication list for this problem includes:    Cardura 4 Mg Tabs (Doxazosin mesylate) .[name]... Take one tablet daily    Norvasc 10 Mg Tabs (Amlodipine besylate) .[name]... 1 by mouth qd  BP today: 110/68 Prior BP: 120/74 (07/29/2008)  Labs Reviewed: K+: 4.4 (06/28/2008) Creat: : 0.8 (06/28/2008)   Chol: 160 (12/09/2007)   HDL: 44.4 (12/09/2007)   LDL: 100 (12/09/2007)   TG: 76 (12/09/2007)  Problem # 7:  ANEMIA-IRON DEFICIENCY (ICD-280.9)  Hgb: 12.9 (06/28/2008)   Hct: 37.8 (06/28/2008)   Platelets: 324.0 (06/28/2008) RBC: 4.33 (06/28/2008)   RDW: 13.3 (06/28/2008)   WBC: 6.2 (06/28/2008) MCV: 87.3 (06/28/2008)   MCHC: 34.1 (06/28/2008) Iron: 80 (12/09/2007)   % Sat: 30.1 (12/09/2007) B12: 244 (12/09/2007)   Folate: 17.7 (12/09/2007)   TSH: 1.16 (02/03/2008)  Complete Medication List: 1)  Endocet 10-325 Mg Tabs (Oxycodone-acetaminophen) .... Take one tablet  every 6-8 hours as needed for pain. 2)  Fentanyl 50 Mcg/hr Pt72 (Fentanyl) .... Change patch every three days 3)  Prilosec 40 Mg Cpdr (Omeprazole) .... Take 1 tablet by mouth once a day 4)  Cardura 4 Mg Tabs (Doxazosin mesylate) .... Take one tablet daily 5)  Amitriptyline Hcl 25 Mg Tabs (Amitriptyline hcl) .... Take 2 tablets every evening at bedtime as needed 6)  Mobic 15 Mg Tabs (Meloxicam) .... Take 1/2 - 1 by mouth once daily 7)  Temazepam 15 Mg Caps (Temazepam) .Marland KitchenMarland KitchenMarland Kitchen  Take one to two capsules at bedtime 8)  Cyclobenzaprine Hcl 10 Mg Tabs (Cyclobenzaprine hcl) .... Take one tablet three times daily 9)  Gabapentin 600 Mg Tabs (Gabapentin) .... Take one tablet 4 times daily 10)  Norvasc 10 Mg Tabs (Amlodipine besylate) .[name].. 1 by mouth qd 11)  Promethazine Hcl 25 Mg Tabs (Promethazine hcl) .... Take one tablet  q 4-6 hrs as needed for nausea 12)  Flecainide Acetate 50 Mg Tabs (Flecainide acetate) .[name].. 1 tablet by mouth every morning and 1/2 tablet po every night 13)  Spiriva Handihaler 18 Mcg Caps (Tiotropium bromide monohydrate) .... One puff once daily 14)  Ventolin Hfa 108 (90 Base) Mcg/act Aers (Albuterol sulfate) .... Two puffs up to four times per day as needed Prescriptions: NORVASC 10 MG  TABS (AMLODIPINE BESYLATE) 1 by mouth qd  #30 x 6   Entered by:   Ardyth Man   Authorized by:   Loreen Freud DO   Signed by:   Ardyth Man on 08/03/2008   Method used:   Print then Give to Patient   RxID:   [id number] FENTANYL 50 MCG/HR  PT72 (FENTANYL) change patch every three days  #10 x 0   Entered by:   Ardyth Man   Authorized by:   Loreen Freud DO   Signed by:   Ardyth Man on 08/03/2008   Method used:   Print then Give to Patient   RxID:   [id number] ENDOCET 10-325 MG  TABS (OXYCODONE-ACETAMINOPHEN) Take one tablet every 6-8 hours as needed for pain.  #120 x 0   Entered by:   Ardyth Man   Authorized by:   Loreen Freud DO   Signed by:   Ardyth Man on  08/03/2008   Method used:   Print then Give to Patient   RxID:   [id number]

## 2010-04-18 NOTE — Progress Notes (Signed)
  Phone Note Outgoing Call Call back at [facility] Phone [phone]   Call placed by: Ardyth Man,  Aug 09, 2008 11:52 AM Call placed to: Patient Summary of Call: Patient aware of uti and rx sent to pharmacy Ardyth Man  Aug 09, 2008 11:52 AM

## 2010-04-18 NOTE — Miscellaneous (Signed)
Summary: clotest  Clinical Lists Changes  Problems: Added new problem of GASTRITIS (ICD-535.50) Orders: Added new Test order of TLB-H Pylori Screen Gastric Biopsy (83013-CLOTEST) - Signed 

## 2010-04-18 NOTE — Assessment & Plan Note (Signed)
Summary: f/u hospital/cbs   Vital Signs:  Patient profile:   65 year old female Weight:      95.8 pounds Temp:     98.0 degrees F oral Pulse rate:   68 / minute Pulse rhythm:   regular BP sitting:   102 / 60  (right arm)  Vitals Entered By: Almeta Monas CMA Duncan Dull) (November 15, 2009 2:01 PM) CC: pt wants to discuss health and needs refill on meds   History of Present Illness: Pt was recently in hospital in [place] 10/26/2009 for pneumonia and sepsis from picc line and UTI.  Her heart was found to be functioning at 20%---she is scheduled for a pacemaker ---Dr Graciela Husbands is putting it in and Dr Deborah Chalk is doing cath tomorrow.   Pt is finished with abx.  Her K is high but she just had labs done at Dr Deborah Chalk s  Office yesterday.    Current Medications (verified): 1)  Spiriva Handihaler 18 Mcg Caps (Tiotropium Bromide Monohydrate) .... One Puff Once Daily 2)  Ventolin Hfa 108 (90 Base) Mcg/act Aers (Albuterol Sulfate) .... Two Puffs Up To Four Times Per Day As Needed 3)  Endocet 10-325 Mg  Tabs (Oxycodone-Acetaminophen) .... Take One Tablet Every 6-8 Hours As Needed For Pain. 4)  Fentanyl 50 Mcg/hr  Pt72 (Fentanyl) .... Change Patch Every Three Days 5)  Mobic 15 Mg Tabs (Meloxicam) .... Take 1/2 - 1 By Mouth Once Daily 6)  Pantoprazole Sodium 40 Mg Tbec (Pantoprazole Sodium) .... Take 1 Tablet By Mouth Once A Day 7)  Temazepam 15 Mg  Caps (Temazepam) .... Take One To Two Capsules At Bedtime 8)  Cyclobenzaprine Hcl 10 Mg  Tabs (Cyclobenzaprine Hcl) .... Take One Tablet Three Times Daily 9)  Gabapentin 600 Mg  Tabs (Gabapentin) .... Take One Tablet 4 Times Daily 10)  Zofran 8 Mg Tabs (Ondansetron Hcl) .[name].. 1 By Mouth Q12 H As Needed 11)  Foradil Aerolizer 12 Mcg Caps (Formoterol Fumarate) .... One Puff Two Times A Day 12)  Resource Breeze  Liqd (Nutritional Supplements) .... Take 1 Can (250 Calories/can) Two Times A Day Per Peg Tube. Dx: 536.3-Gastroparesis, 787.01-Nausea and Vomiting 13)  Reglan 5 Mg  Tabs (Metoclopramide Hcl) .... Take 1 Tablet By Mouth Three Times A Day Before Meals 14)  Aspirin 81 Mg Tbec (Aspirin) .[name].. 1 By Mouth Qd 15)  Furosemide 20 Mg Tabs (Furosemide) .[name].. 1 By Mouth Qd 16)  Nitrostat 0.4 Mg Subl (Nitroglycerin) .... By Mouth 17)  Amiodarone Hcl 200 Mg Tabs (Amiodarone Hcl) .[name].. 1 By Mouth Daily 18)  Carvedilol 6.25 Mg Tabs (Carvedilol) .... 2 By Mouth Daily 19)  Metroclopramide 5 Mg .... By Mouth Three Times A Day  Allergies (verified): 1)  ! Talwin 2)  ! Sulfa  Past History:  Past medical, surgical, family and social histories (including risk factors) reviewed for relevance to current acute and chronic problems.  Past Medical History: Anemia-iron deficiency CHRONIC PARTIAL SMALL BOWEL OBSTRUCTION Hypertension Osteoarthritis GERD CAD Left bundle branch block Atrial fibrillation - Dr. Roger Shelter Chronic pain Fibromyalgia Diverticulosis, colon COPD/Emphysema      -06/28/08 PFT FEV1 1.95 (97%), FVC 2.79 (101%), FEV1% 70, TLC 3.88 (87%), DLCO 64% Pneumonia      - 03/09, 12/09, 02/10, 06/10 Exudative pleural effusion s/p right thoracentesis 03/09 RLL pneumonia 10/26/2009,  sepsis picc line, UTI,   hospitalization [place]  Past Surgical History: Reviewed history from 09/07/2009 and no changes required. Intestinal resection (5times),S/P PARTIAL COLECTOMY 2001 Hysterectomy-complete Appendectomy Bladder  lift Tonsillectomy Lysis of intestinal adhesions-numerous surgeries/LAST 2003 PICC Line TAH Intestinal surgery again 04/27/2009 06/2009--changed colostomy tube---Dr Dwain Sarna  Family History: Reviewed history from 05/12/2008 and no changes required. Family History of Heart Disease: Father Family History of CVA: Mother No FH of Colon Cancer:  Social History: Reviewed history from 04/07/2009 and no changes required. Occupation: Disabled Alcohol Use - no Illicit Drug Use - no Patient is a former smoker, quit 1988 x67yrs, <1ppd.  Daily Caffeine  Use   Impression & Recommendations:  Problem # 1:  PNEUMONIA, RIGHT LOWER LOBE (ICD-486) pt finished abx pulm appointment tomorrow Orders: T-2 View CXR (71020TC)  Problem # 2:  CORONARY ARTERY DISEASE (ICD-414.00)  stress test tomorrow The following medications were removed from the medication list:    Norvasc 5 Mg Tabs (Amlodipine besylate) .[name]... 1 by mouth once daily    Flecainide Acetate 50 Mg Tabs (Flecainide acetate) .[name]... 1 tablet by mouth every morning and 1/2 tablet po every night Her updated medication list for this problem includes:    Aspirin 81 Mg Tbec (Aspirin) .[name]... 1 by mouth qd    Furosemide 20 Mg Tabs (Furosemide) .[name]... 1 by mouth qd    Nitrostat 0.4 Mg Subl (Nitroglycerin) ..... By mouth    Amiodarone Hcl 200 Mg Tabs (Amiodarone hcl) .[name]... 1 by mouth daily    Carvedilol 6.25 Mg Tabs (Carvedilol) .[name]... 2 by mouth daily  Labs Reviewed: Chol: 160 (12/09/2007)   HDL: 44.4 (12/09/2007)   LDL: 100 (12/09/2007)   TG: 76 (12/09/2007)  Problem # 3:  ATRIAL FIBRILLATION (ICD-427.31) EF in [place] 20%  pt has appointment with Dr Graciela Husbands for pacemaker The following medications were removed from the medication list:    Norvasc 5 Mg Tabs (Amlodipine besylate) .[name]... 1 by mouth once daily    Flecainide Acetate 50 Mg Tabs (Flecainide acetate) .[name]... 1 tablet by mouth every morning and 1/2 tablet po every night Her updated medication list for this problem includes:    Aspirin 81 Mg Tbec (Aspirin) .[name]... 1 by mouth qd    Amiodarone Hcl 200 Mg Tabs (Amiodarone hcl) .[name]... 1 by mouth daily    Carvedilol 6.25 Mg Tabs (Carvedilol) .[name]... 2 by mouth daily  Reviewed the following: PT: 13.3 (11/17/2008)   INR: 1.0 (11/17/2008)  Problem # 4:  CHRONIC PAIN SYNDROME (ICD-338.4) refill meds   Problem # 5:  OSTEOPOROSIS (ICD-733.00)  we will do reclast after heart situation has settled down  Vit D:18 (02/15/2009), 57 (01/15/2008)  Problem # 6:  COPD (ICD-496)  Her updated medication list  for this problem includes:    Spiriva Handihaler 18 Mcg Caps (Tiotropium bromide monohydrate) ..... One puff once daily    Ventolin Hfa 108 (90 Base) Mcg/act Aers (Albuterol sulfate) .[name]..[name] Two puffs up to four times per day as needed    Foradil Aerolizer 12 Mcg Caps (Formoterol fumarate) ..... One puff two times a day  Pulmonary Functions Reviewed: FEV1: 1.95 (06/28/2008)   FEV 25-75: 0.99 (06/28/2008)   O2 sat: 93 (11/01/2009)     Vaccines Reviewed: Flu Vax: Fluvax 3+ (12/28/2008)  Problem # 7:  OSTEOPOROSIS (ICD-733.00)  hold off on reclast until cardiac status is stable  Vit D:18 (02/15/2009), 57 (01/15/2008) Assessment: Unchanged  Complete Medication List: 1)  Spiriva Handihaler 18 Mcg Caps (Tiotropium bromide monohydrate) .... One puff once daily 2)  Ventolin Hfa 108 (90 Base) Mcg/act Aers (Albuterol sulfate) .... Two puffs up to four times per day as needed 3)  Endocet 10-325  Mg Tabs (Oxycodone-acetaminophen) .... Take one tablet every 6-8 hours as needed for pain. 4)  Fentanyl 50 Mcg/hr Pt72 (Fentanyl) .... Change patch every three days 5)  Mobic 15 Mg Tabs (Meloxicam) .... Take 1/2 - 1 by mouth once daily 6)  Pantoprazole Sodium 40 Mg Tbec (Pantoprazole sodium) .... Take 1 tablet by mouth once a day 7)  Temazepam 15 Mg Caps (Temazepam) .... Take one to two capsules at bedtime 8)  Cyclobenzaprine Hcl 10 Mg Tabs (Cyclobenzaprine hcl) .... Take one tablet three times daily 9)  Gabapentin 600 Mg Tabs (Gabapentin) .... Take one tablet 4 times daily 10)  Zofran 8 Mg Tabs (Ondansetron hcl) .[name].. 1 by mouth q12 h as needed 11)  Foradil Aerolizer 12 Mcg Caps (Formoterol fumarate) .... One puff two times a day 12)  Resource Breeze Liqd (Nutritional supplements) .... Take 1 can (250 calories/can) two times a day per peg tube. dx: 536.3-gastroparesis, 787.01-nausea and vomiting 13)  Reglan 5 Mg Tabs (Metoclopramide hcl) .... Take 1 tablet by mouth three times a day before meals 14)   Aspirin 81 Mg Tbec (Aspirin) .[name].. 1 by mouth qd 15)  Furosemide 20 Mg Tabs (Furosemide) .[name].. 1 by mouth qd 16)  Nitrostat 0.4 Mg Subl (Nitroglycerin) .... By mouth 17)  Amiodarone Hcl 200 Mg Tabs (Amiodarone hcl) .[name].. 1 by mouth daily 18)  Carvedilol 6.25 Mg Tabs (Carvedilol) .... 2 by mouth daily 19)  Metroclopramide 5 Mg  .... By mouth three times a day  Patient Instructions: 1)  Please schedule a follow-up appointment in 6 months .  Prescriptions: FENTANYL 50 MCG/HR  PT72 (FENTANYL) change patch every three days  #10 x 0   Entered and Authorized by:   Loreen Freud DO   Signed by:   Loreen Freud DO on 11/15/2009   Method used:   Print then Give to Patient   RxID:   [id number] ENDOCET 10-325 MG  TABS (OXYCODONE-ACETAMINOPHEN) Take one tablet every 6-8 hours as needed for pain.  #120 x 0   Entered and Authorized by:   Loreen Freud DO   Signed by:   Loreen Freud DO on 11/15/2009   Method used:   Print then Give to Patient   RxID:   [id number]

## 2010-04-18 NOTE — Progress Notes (Signed)
Summary: Need Order to flush PICC line/pt there now   Phone Note From Other Clinic   Caller: [facility] Call For: Dr Juanda Chance Summary of Call: Patient having an [facility] Infusion today- patient  is there now and needs an order that reads: May flush PICC line with 250 units/2.5 cc's of Heparin after infusion. Faxed to  [phone]. Initial call taken by: Leanor Kail [facility],  Aug 04, 2009 9:06 AM  Follow-up for Phone Call        order faxed Darcey Nora RN, [facility]  Aug 04, 2009 9:13 AM

## 2010-04-18 NOTE — Letter (Signed)
Summary: Encounter Notice/[facility]  Encounter Notice/[facility]   Imported By: Lanelle Bal 01/06/2010 11:36:37  _____________________________________________________________________  External Attachment:    Type:   Image     Comment:   External Document

## 2010-04-18 NOTE — Op Note (Signed)
Summary: Iron Dextran/[place]  Iron Dextran/[place]   Imported By: Sherian Rein 08/19/2009 11:12:34  _____________________________________________________________________  External Attachment:    Type:   Image     Comment:   External Document

## 2010-04-18 NOTE — Assessment & Plan Note (Signed)
Summary: ROA 1 MONTH,CBS   Vital Signs:  Patient Profile:   65 Years Old Female Weight:      94.0 pounds Temp:     97.9 degrees F oral Pulse rate:   72 / minute Resp:     16 per minute BP sitting:   110 / 68  (right arm)  Pt. in pain?   no  Vitals Entered By: Ardyth Man (September 05, 2006 11:23 AM)               Prescriptions: MIRAPEX 0.125 MG  TABS (PRAMIPEXOLE DIHYDROCHLORIDE) 1 by mouth 2 hrs before bed time  #30 x 6   Entered and Authorized by:   Leanne Chang MD   Signed by:   Leanne Chang MD on 09/05/2006   Method used:   Print then Give to Patient   RxID:   [id number] FENTANYL 50 MCG/HR  PT72 (FENTANYL)   #10 x 0   Entered and Authorized by:   Leanne Chang MD   Signed by:   Leanne Chang MD on 09/05/2006   Method used:   Handwritten   RxID:   [id number] ENDOCET 10-325 MG  TABS (OXYCODONE-ACETAMINOPHEN) Take one tablet every 6-8 hours as needed for pain.  #60 x 0   Entered and Authorized by:   Leanne Chang MD   Signed by:   Leanne Chang MD on 09/05/2006   Method used:   Handwritten   RxID:   [id number] MIRAPEX 0.125 MG  TABS (PRAMIPEXOLE DIHYDROCHLORIDE) 1 by mouth 2 hrs before bed time  #30 x 6   Entered and Authorized by:   Leanne Chang MD   Signed by:   Leanne Chang MD on 09/05/2006   Method used:   Handwritten   RxID:   [id number] VERELAN PM 100 MG  CP24 (VERAPAMIL HCL) Take one tablet daily  #30 x 3   Entered by:   Ardyth Man   Authorized by:   Leanne Chang MD   Signed by:   Ardyth Man on 09/05/2006   Method used:   Print then Give to Patient   RxID:   [id number] GABAPENTIN 600 MG  TABS (GABAPENTIN) Take one tablet daily  #30 x 3   Entered by:   Ardyth Man   Authorized by:   Leanne Chang MD   Signed by:   Ardyth Man on 09/05/2006   Method used:   Print then Give to Patient   RxID:   [id number] CLONAZEPAM 0.5 MG  TABS (CLONAZEPAM) Take 2 tablets every evening at bedtime.  #60 x 2   Entered by:   Ardyth Man   Authorized by:   Leanne Chang MD   Signed by:   Ardyth Man on 09/05/2006   Method used:   Print then Give to Patient   RxID:   [id number] CYCLOBENZAPRINE HCL 10 MG  TABS (CYCLOBENZAPRINE HCL) Take one tablet three times daily  #90 x 2   Entered by:   Ardyth Man   Authorized by:   Leanne Chang MD   Signed by:   Ardyth Man on 09/05/2006   Method used:   Print then Give to Patient   RxID:   [id number] TEMAZEPAM 15 MG  CAPS (TEMAZEPAM) Take one to two capsules at bedtime  #60 x 3   Entered by:   Ardyth Man   Authorized by:   Leanne Chang MD   Signed by:   Ardyth Man on 09/05/2006   Method used:   Print then Give to  Patient   RxID:   [id number] CELEBREX 200 MG  CAPS (CELECOXIB) Take once daily  #30 x 2   Entered by:   Ardyth Man   Authorized by:   Leanne Chang MD   Signed by:   Ardyth Man on 09/05/2006   Method used:   Print then Give to Patient   RxID:   [id number] AMITRIPTYLINE HCL 25 MG  TABS (AMITRIPTYLINE HCL) Take 2 tablets every evening at bedtime  #60 x 2   Entered by:   Ardyth Man   Authorized by:   Leanne Chang MD   Signed by:   Ardyth Man on 09/05/2006   Method used:   Print then Give to Patient   RxID:   [id number] ENABLEX 7.5 MG  TB24 (DARIFENACIN HYDROBROMIDE) Take daily as directed  #30 x 2   Entered by:   Ardyth Man   Authorized by:   Leanne Chang MD   Signed by:   Ardyth Man on 09/05/2006   Method used:   Print then Give to Patient   RxID:   [id number] CARDURA 4 MG  TABS (DOXAZOSIN MESYLATE) Take one tablet daily  #30 x 3   Entered by:   Ardyth Man   Authorized by:   Leanne Chang MD   Signed by:   Ardyth Man on 09/05/2006   Method used:   Print then Give to Patient   RxID:   [id number] OMEPRAZOLE 20 MG  CPDR (OMEPRAZOLE) Take one tablet daily as directed  #30 x 3   Entered by:   Ardyth Man   Authorized by:    Leanne Chang MD   Signed by:   Ardyth Man on 09/05/2006   Method used:   Print then Give to Patient   RxID:   [id number] ENDOCET 10-325 MG  TABS (OXYCODONE-ACETAMINOPHEN) Take one tablet every 6-8 hours as needed for pain.  #60 x 0   Entered by:   Ardyth Man   Authorized by:   Leanne Chang MD   Signed by:   Ardyth Man on 09/05/2006   Method used:   Print then Give to Patient   RxID:   [id number]    Chief Complaint:  One month follow up.  History of Present Illness:  Hypertension Follow-Up      This is a 65 year old woman who presents for Hypertension follow-up.  The patient denies lightheadedness, urinary frequency, edema, and fatigue.  The patient denies the following associated symptoms: chest pain, chest pressure,syncope, leg edema, and pedal edema.  Compliance with medications (by patient report) has been near 100%.  The patient reports that dietary compliance has been good.    Also taking her pain medication regularly.No significant side effects. Tolerating well. Able to do actvities of daily living.        Physical Exam  General:     alert.   Lungs:     Normal respiratory effort, chest expands symmetrically. Lungs are clear to auscultation, no crackles or wheezes. Heart:     Normal rate and regular rhythm. S1 and S2 normal without gallop, murmur, click, rub or other extra sounds. Pulses:     R and L carotid,radial,dorsalis pedis and posterior tibial pulses are full and equal bilaterally Extremities:     No clubbing, cyanosis, edema, or deformity noted with normal full range of motion of all joints.      Impression & Recommendations:  Problem # 1:  SYNDROME, CHRONIC PAIN (ICD-338.4) Stable 1. Refill medications  Problem # 2:  RESTLESS LEG SYNDROME (ICD-333.94) improved 1.  Refill Mirapex  Problem # 3:  HYPERTENSION, BENIGN ESSENTIAL (ICD-401.1) Assessment: Improved Improved. Continue medications f/u 3 months and as  needed continue to monitor blood pressure weekly Her updated medication list for this problem includes:    Cardura 4 Mg Tabs (Doxazosin mesylate) .[name]... Take one tablet daily    Verelan Pm 100 Mg Cp24 (Verapamil hcl) .[name]... Take one tablet daily    Norvasc 10 Mg Tabs (Amlodipine besylate) .[name]... 1 by mouth qd  BP today: 110/68 Prior BP: 130/90 (08/01/2006)  Labs Reviewed: Creat: 0.8 (08/16/2006) Chol: 139 (03/11/2006)   HDL: 38.5 (03/11/2006)   LDL: 85 (03/11/2006)   TG: 76 (03/11/2006)  Orders: TLB-BMP (Basic Metabolic Panel-BMET) (80048-METABOL)   Problem # 4:  ANEMIA-IRON DEFICIENCY (ICD-280.9)  Orders: TLB-CBC Platelet - w/Differential (85025-CBCD)   Medications Added to Medication List This Visit: 1)  Endocet 10-325 Mg Tabs (Oxycodone-acetaminophen) .... Take one tablet every 6-8 hours as needed for pain. 2)  Fentanyl 50 Mcg/hr Pt72 (Fentanyl) 3)  Omeprazole 20 Mg Cpdr (Omeprazole) .... Take one tablet daily as directed 4)  Cardura 4 Mg Tabs (Doxazosin mesylate) .... Take one tablet daily 5)  Enablex 7.5 Mg Tb24 (Darifenacin hydrobromide) .... Take daily as directed 6)  Amitriptyline Hcl 25 Mg Tabs (Amitriptyline hcl) .... Take 2 tablets every evening at bedtime 7)  Celebrex 200 Mg Caps (Celecoxib) .... Take once daily 8)  Temazepam 15 Mg Caps (Temazepam) .... Take one to two capsules at bedtime 9)  Cyclobenzaprine Hcl 10 Mg Tabs (Cyclobenzaprine hcl) .... Take one tablet three times daily 10)  Clonazepam 0.5 Mg Tabs (Clonazepam) .... Take 2 tablets every evening at bedtime. 11)  Gabapentin 600 Mg Tabs (Gabapentin) .... Take one tablet daily 12)  Verelan Pm 100 Mg Cp24 (Verapamil hcl) .... Take one tablet daily 13)  Mirapex 0.125 Mg Tabs (Pramipexole dihydrochloride) .[name].. 1 by mouth 2 hrs before bed time 14)  Norvasc 10 Mg Tabs (Amlodipine besylate) .[name].. 1 by mouth qd

## 2010-04-18 NOTE — Assessment & Plan Note (Signed)
Summary: MONTHLY B12 SHOT...LSW.  Nurse Visit   Allergies: 1)  ! Talwin 2)  ! Sulfa  Medication Administration  Injection # 1:    Medication: Vit B12 1000 mcg    Diagnosis: B12 DEFICIENCY (ICD-266.2)    Route: IM    Site: L deltoid    Exp Date: 04/2011    Lot #: [id number]    Mfr: [employer]    Comments: pt to schdule next monthly b12 at front desk    Patient tolerated injection without complications    Given by: Chales Abrahams CMA (AAMA) (September 15, 2009 11:13 AM)  Orders Added: 1)  Vit B12 1000 mcg [J3420]

## 2010-04-18 NOTE — Progress Notes (Signed)
Summary: [facility] REFILL  Phone Note Refill Request Message from:  Fax from Pharmacy on December 12, 2009 3:33 PM  Refills Requested: Medication #1:  MOBIC 15 MG TABS hold   Last Refilled: 11/01/2009 [facility]   [address], [place]   FAX [phone]   QTY = 30  Initial call taken by: Jerolyn Shin,  December 12, 2009 3:36 PM    New/Updated Medications: MOBIC 15 MG TABS (MELOXICAM) take 1/2-1 tablet daily Prescriptions: MOBIC 15 MG TABS (MELOXICAM) take 1/2-1 tablet daily  #30 x 2   Entered by:   Doristine Devoid CMA   Authorized by:   Loreen Freud DO   Signed by:   Doristine Devoid CMA on 12/12/2009   Method used:   Electronically to        [facility] (retail)       [address]       [place], [place]  [address]       Ph: [phone]       Fax: [phone]   RxID:   [id number]

## 2010-04-18 NOTE — Letter (Signed)
Summary: Generic Letter  [facility]  [address] [place], [place] [address]   Phone: [phone]  Fax: [phone]    07/22/2009  SHANEKQUA SCHAPER [address] [place], [place]  [address]          Dear Ms. Gancarz,     You have an Iron Infusion scheduled at [facility] on 08-04-09 at [facility]. Please arrive by 8:45am to register in the outpatient registration area. You may eat and take your medication as usual. Please plan on being there 4-6 hours.     Please repeat you blood work at the [facility] lab on or around 08-22-09. The order is already in the computer, so just stop by the lab M-F, 7:30am to 5:30pm.     If you have any questions please call me at [phone].       Sincerely,   Laureen Ochs LPN  Appended Document: Generic Letter Letter mailed to patient.

## 2010-04-18 NOTE — Procedures (Signed)
Summary: Gastroenterology COLON  Gastroenterology COLON   Imported By: Irma Newness 08/07/2007 14:03:32  _____________________________________________________________________  External Attachment:    Type:   Image     Comment:   External Document

## 2010-04-18 NOTE — Progress Notes (Signed)
Summary: rx  Phone Note Call from Patient Call back at Home Phone [phone]   Caller: Patient Call For: sood Reason for Call: Talk to Nurse Summary of Call: pt has hit donut hole with insurance for Spiriva.  Insurance will not pay for it any longer.  Please advise pt what to do now. [facility] Initial call taken by: Eugene Gavia,  August 23, 2008 2:16 PM  Follow-up for Phone Call        pt unable to afford spiriva, pls advise if you want to change--pt aware vs out until 6/14-gave pt sample until vs returns and reviews  Follow-up by: Philipp Deputy CMA,  August 23, 2008 4:25 PM

## 2010-04-18 NOTE — Medication Information (Signed)
Summary: [facility]--physician order  [facility]--physician order   Imported By: Freddy Jaksch 08/25/2007 16:30:51  _____________________________________________________________________  External Attachment:    Type:   Image     Comment:   External Document

## 2010-04-18 NOTE — Progress Notes (Signed)
Summary: [facility]---rx cyclobenzaprine  Phone Note Refill Request   Refills Requested: Medication #1:  CYCLOBENZAPRINE HCL 10 MG  TABS Take one tablet three times daily   Last Refilled: 04/14/2008 [facility]--[phone]  Initial call taken by: Freddy Jaksch,  May 31, 2008 8:39 AM  Follow-up for Phone Call        last ov 04/29/08, last refill #90 x 3 on 12/08/07 .Kandice Hams  June 01, 2008 12:04 PM   Additional Follow-up for Phone Call Additional follow up Details #1::        sent Additional Follow-up by: Loreen Freud DO,  June 01, 2008 12:58 PM      Prescriptions: CYCLOBENZAPRINE HCL 10 MG  TABS (CYCLOBENZAPRINE HCL) Take one tablet three times daily  #90 x 3   Entered and Authorized by:   Loreen Freud DO   Signed by:   Loreen Freud DO on 06/01/2008   Method used:   Electronically to        [facility] (retail)       [address]       [place], [place]  [address]       Ph: [phone]       Fax: [phone]   RxID:   [id number]

## 2010-04-18 NOTE — Progress Notes (Signed)
Summary: Refill Request  Phone Note Refill Request Call back at Home Phone [phone] Message from:  Patient on October 18, 2009 10:42 AM  Refills Requested: Medication #1:  ENDOCET 10-325 MG  TABS Take one tablet every 6-8 hours as needed for pain.   Dosage confirmed as above?Dosage Confirmed   Supply Requested: 1 month   Last Refilled: 09/07/2009  Medication #2:  FENTANYL 50 MCG/HR  PT72 change patch every three days   Dosage confirmed as above?Dosage Confirmed   Supply Requested: 1 month  Method Requested: Pick up at Office Initial call taken by: Lavell Islam,  October 18, 2009 10:43 AM  Follow-up for Phone Call        ok to refill x1 each Follow-up by: Loreen Freud DO,  October 18, 2009 11:40 AM  Additional Follow-up for Phone Call Additional follow up Details #1::        Left message on voicemail notifying patient prescriptions are up front and ready for pickup. Additional Follow-up by: Lucious Groves CMA,  October 18, 2009 11:54 AM    Prescriptions: FENTANYL 50 MCG/HR  PT72 (FENTANYL) change patch every three days  #10 x 0   Entered and Authorized by:   Loreen Freud DO   Signed by:   Loreen Freud DO on 10/18/2009   Method used:   Print then Give to Patient   RxID:   [id number] ENDOCET 10-325 MG  TABS (OXYCODONE-ACETAMINOPHEN) Take one tablet every 6-8 hours as needed for pain.  #120 x 0   Entered and Authorized by:   Loreen Freud DO   Signed by:   Loreen Freud DO on 10/18/2009   Method used:   Print then Give to Patient   RxID:   [id number]

## 2010-04-18 NOTE — Letter (Signed)
[facility]   Imported By: Lanelle Bal 05/31/2008 11:36:03  _____________________________________________________________________  External Attachment:    Type:   Image     Comment:   External Document

## 2010-04-18 NOTE — Letter (Signed)
Summary: [facility]  [facility]   Imported By: Sherian Rein 08/25/2008 10:47:05  _____________________________________________________________________  External Attachment:    Type:   Image     Comment:   External Document

## 2010-04-18 NOTE — Miscellaneous (Signed)
Summary: Orders Update  Clinical Lists Changes  Problems: Added new problem of FRACTURE, HUMERUS, PROXIMAL (ICD-812.00) Orders: Added new Referral order of Orthopedic Surgeon Referral (Ortho Surgeon) - Signed Added new Referral order of Orthopedic Referral (Ortho) - Signed

## 2010-04-18 NOTE — Progress Notes (Signed)
Summary: refill  Phone Note Refill Request Message from:  Fax from Pharmacy on September 26, 2009 3:50 PM  Refills Requested: Medication #1:  CYCLOBENZAPRINE HCL 10 MG  TABS Take one tablet three times daily  Medication #2:  GABAPENTIN 600 MG  TABS Take one tablet 4 times daily [facility] fax [phone] - tel [phone]  Initial call taken by: Okey Regal Spring,  September 26, 2009 3:51 PM  Follow-up for Phone Call        last ov 09-07-09, last filled 06-21-09 #120  2.Marland KitchenMarland KitchenFelecia Deloach CMA  September 26, 2009 5:26 PM   Additional Follow-up for Phone Call Additional follow up Details #1::        ok to refill both x1 Additional Follow-up by: Loreen Freud DO,  September 27, 2009 9:51 AM    Prescriptions: GABAPENTIN 600 MG  TABS (GABAPENTIN) Take one tablet 4 times daily  #120 x 0   Entered by:   Jeremy Johann CMA   Authorized by:   Loreen Freud DO   Signed by:   Jeremy Johann CMA on 09/27/2009   Method used:   Faxed to ...       [facility] (retail)       [address]       [place], [place]  [address]       Ph: [phone]       Fax: [phone]   RxID:   [id number] CYCLOBENZAPRINE HCL 10 MG  TABS (CYCLOBENZAPRINE HCL) Take one tablet three times daily  #90 x 0   Entered by:   Jeremy Johann CMA   Authorized by:   Loreen Freud DO   Signed by:   Jeremy Johann CMA on 09/27/2009   Method used:   Faxed to ...       [facility] (retail)       [address]       [place], [place]  [id number]       Ph: [phone]       Fax: [phone]   RxID:   [id number]

## 2010-04-18 NOTE — Progress Notes (Signed)
Summary: refill - dr Laury Axon  Phone Note Call from Patient Call back at [facility] Phone [phone]   Caller: Patient Summary of Call: refill oxycodone apap 10/325mg  4 times a day ---- fentanyl patch 2 boxes   Initial call taken by: Okey Regal Spring,  September 02, 2008 9:38 AM  Follow-up for Phone Call        fentanyl patch last OV 09-01-08, last filled #10 08-03-08.....................Marland KitchenFelecia Deloach CMA  September 02, 2008 9:49 AM  oxycodone not on med list....................Marland KitchenFelecia Deloach CMA  September 02, 2008 9:51 AM  Additional Follow-up for Phone Call Additional follow up Details #1::        ok to refill both---- endocet is oxycodone Additional Follow-up by: Loreen Freud DO,  September 02, 2008 9:56 AM    Additional Follow-up for Phone Call Additional follow up Details #2::    pt aware rx ready for pick-up...................................Marland KitchenFelecia Deloach CMA  September 02, 2008 11:50 AM    Prescriptions: FENTANYL 50 MCG/HR  PT72 (FENTANYL) change patch every three days  #10 x 0   Entered by:   Jeremy Johann CMA   Authorized by:   Loreen Freud DO   Signed by:   Jeremy Johann CMA on 09/02/2008   Method used:   Print then Give to Patient   RxID:   [id number] ENDOCET 10-325 MG  TABS (OXYCODONE-ACETAMINOPHEN) Take one tablet every 6-8 hours as needed for pain.  #120 x 0   Entered by:   Jeremy Johann CMA   Authorized by:   Loreen Freud DO   Signed by:   Jeremy Johann CMA on 09/02/2008   Method used:   Print then Give to Patient   RxID:   [id number]

## 2010-04-18 NOTE — Progress Notes (Signed)
Summary: [facility]--REFILL endocet and fentnyl  Phone Note Call from Patient Call back at Home Phone [phone]   Caller: Patient Reason for Call: Refill Medication Summary of Call: PATIENT IS CALLING FOR A REFILL ON HER ENDOCET 10-325MG  AND FENTANYL 50 MG. CALL WHEN READY TO PICK UP Initial call taken by: Freddy Jaksch,  January 28, 2008 9:17 AM  Follow-up for Phone Call        last refill patch #10 with 0 refills, last endocet #120 no refills last ov 12/29/07 .Kandice Hams  January 28, 2008 11:20 AM  Follow-up by: Kandice Hams,  January 28, 2008 11:20 AM  Additional Follow-up for Phone Call Additional follow up Details #1::        ok to fill x1 each Additional Follow-up by: Loreen Freud DO,  January 28, 2008 12:19 PM    Additional Follow-up for Phone Call Additional follow up Details #2::    Spoke with pt informed rx ready for pickup .Kandice Hams  January 28, 2008 12:43 PM  Follow-up by: Kandice Hams,  January 28, 2008 12:43 PM    Prescriptions: ENDOCET 10-325 MG  TABS (OXYCODONE-ACETAMINOPHEN) Take one tablet every 6-8 hours as needed for pain.  #120 x 0   Entered by:   Kandice Hams   Authorized by:   Loreen Freud DO   Signed by:   Kandice Hams on 01/28/2008   Method used:   Print then Give to Patient   RxID:   [id number] FENTANYL 50 MCG/HR  PT72 (FENTANYL) change patch every three days  #10 x 0   Entered by:   Kandice Hams   Authorized by:   Loreen Freud DO   Signed by:   Kandice Hams on 01/28/2008   Method used:   Print then Give to Patient   RxID:   [id number]

## 2010-04-18 NOTE — Progress Notes (Signed)
Summary: sick  Phone Note Call from Patient Call back at Home Phone [phone]   Caller: Patient Reason for Call: Acute Illness Summary of Call: Dr. Laury Axon Patient would like to come into the office she is losing her voice, pain around her left side due to coughing. I offered an appt for tomorrow at 9:45am with dr. Laury Axon, pt refused, i offered an appt with dr. Beverely Low today patient refused, she would like to see dr. Laury Axon only today. Initial call taken by: Charolette Child,  March 01, 2008 9:03 AM  Follow-up for Phone Call        pt coming in the morning Follow-up by: [facility] CMA,  March 01, 2008 4:47 PM

## 2010-04-18 NOTE — Letter (Signed)
Summary: [facility] Progress Note   [facility] Progress Note   Imported By: Roderic Ovens 01/17/2010 16:45:05  _____________________________________________________________________  External Attachment:    Type:   Image     Comment:   External Document

## 2010-04-18 NOTE — Letter (Signed)
[facility]  [facility]   Imported By: Lanelle Bal 02/03/2009 08:52:24  _____________________________________________________________________  External Attachment:    Type:   Image     Comment:   External Document

## 2010-04-18 NOTE — Progress Notes (Signed)
Summary: refill  Phone Note Refill Request Message from:  Patient on January 30, 2010 1:44 PM  Refills Requested: Medication #1:  FENTANYL 50 MCG/HR  PT72 change patch every three days  Medication #2:  ENDOCET 10-325 MG  TABS Take one tablet every 6-8 hours as needed for pain. patient will pick up [id number]  Initial call taken by: Okey Regal Spring,  January 30, 2010 1:45 PM  Follow-up for Phone Call        Last filled 12/28/09 and last seen 01/06/10.Marland KitchenPlease advise Follow-up by: Almeta Monas CMA Duncan Dull),  January 31, 2010 9:10 AM  Additional Follow-up for Phone Call Additional follow up Details #1::        ok to refill x1 each Additional Follow-up by: Loreen Freud DO,  January 31, 2010 9:32 AM    Prescriptions: FENTANYL 50 MCG/HR  PT72 (FENTANYL) change patch every three days  #10 x 0   Entered by:   Almeta Monas CMA (AAMA)   Authorized by:   Loreen Freud DO   Signed by:   Almeta Monas CMA (AAMA) on 01/31/2010   Method used:   Print then Give to Patient   RxID:   [id number] ENDOCET 10-325 MG  TABS (OXYCODONE-ACETAMINOPHEN) Take one tablet every 6-8 hours as needed for pain.  #120 x 0   Entered by:   Almeta Monas CMA (AAMA)   Authorized by:   Loreen Freud DO   Signed by:   Almeta Monas CMA (AAMA) on 01/31/2010   Method used:   Print then Give to Patient   RxID:   [id number]

## 2010-04-18 NOTE — Progress Notes (Signed)
Summary: CALLED FOR RESULTS ON CULTURE  Phone Note Call from Patient Call back at Home Phone [phone]   Caller: Patient Summary of Call: PATIENT CALLED TO SEE IF RESULTS WERE BACK ON HER CULTURE----PLEASE CALL HER TODAY  AT [phone] IF POSSIBLE SO SHE CAN KNOW RESULTS Initial call taken by: Jerolyn Shin,  June 03, 2009 12:17 PM  Follow-up for Phone Call        just came in today---- start levaquin 500 mg  1 by mouth once daily for 10 days  Follow-up by: Loreen Freud DO,  June 03, 2009 12:49 PM  Additional Follow-up for Phone Call Additional follow up Details #1::        Pt is aware. Army Fossa CMA  June 03, 2009 1:16 PM     New/Updated Medications: LEVAQUIN 500 MG TABS (LEVOFLOXACIN) 1 by mouth once daily for 10 days. Prescriptions: LEVAQUIN 500 MG TABS (LEVOFLOXACIN) 1 by mouth once daily for 10 days.  #10 x 0   Entered by:   Army Fossa CMA   Authorized by:   Loreen Freud DO   Signed by:   Army Fossa CMA on 06/03/2009   Method used:   Electronically to        [facility] (retail)       [address]       [place], [place]  [id number]       Ph: [phone]       Fax: [phone]   RxID:   [id number]

## 2010-04-18 NOTE — Miscellaneous (Signed)
Summary: Device preload  Clinical Lists Changes  Observations: Added new observation of PPM INDICATN: NICM A-fib  (12/28/2009 10:56) Added new observation of MAGNET RTE: BOL 85 ERI 65 (12/28/2009 10:56) Added new observation of PPMLEADSTAT3: active (12/28/2009 10:56) Added new observation of PPMLEADSER3: [id number] (12/28/2009 10:56) Added new observation of PPMLEADMOD3: 4196  (12/28/2009 10:56) Added new observation of PPMLEADLOC3: LV  (12/28/2009 10:56) Added new observation of PPMLEADSTAT2: active  (12/28/2009 10:56) Added new observation of PPMLEADSER2: [id number]  (12/28/2009 10:56) Added new observation of PPMLEADMOD2: 5076  (12/28/2009 10:56) Added new observation of PPMLEADLOC2: RV  (12/28/2009 10:56) Added new observation of PPMLEADSTAT1: active  (12/28/2009 10:56) Added new observation of PPMLEADSER1: [id number]  (12/28/2009 10:56) Added new observation of PPMLEADMOD1: 5076  (12/28/2009 10:56) Added new observation of PPMLEADLOC1: RA  (12/28/2009 10:56) Added new observation of PPM IMP MD: Sherryl Manges, MD  (12/28/2009 10:56) Added new observation of PPMLEADDOI3: 12/21/2009  (12/28/2009 10:56) Added new observation of PPMLEADDOI2: 12/21/2009  (12/28/2009 10:56) Added new observation of PPMLEADDOI1: 12/21/2009  (12/28/2009 10:56) Added new observation of PPM DOI: 12/21/2009  (12/28/2009 10:56) Added new observation of PPM SERL#: [id number]  (12/28/2009 10:56) Added new observation of PPM MODL#: [id number]  (12/28/2009 72:53) Added new observation of PACEMAKERMFG: [employer]  (12/28/2009 10:56) Added new observation of PACEMAKER MD: Sherryl Manges, MD  (12/28/2009 10:56)      PPM Specifications Following MD:  Sherryl Manges, MD     PPM Vendor:  [employer]onic     PPM Model Number:  [id number]     PPM Serial Number:  [id number] PPM DOI:  12/21/2009     PPM Implanting MD:  Sherryl Manges, MD  Lead 1    Location: RA     DOI: 12/21/2009     Model #: [id number]     Serial #: [id number]     Status:  active Lead 2    Location: RV     DOI: 12/21/2009     Model #: [id number]     Serial #: [id number]     Status: active Lead 3    Location: LV     DOI: 12/21/2009     Model #: 3235     Serial #: [id number]     Status: active  Magnet Response Rate:  BOL 85 ERI 65  Indications:  NICM A-fib

## 2010-04-18 NOTE — Progress Notes (Signed)
Summary: refill  Phone Note Refill Request   Refills Requested: Medication #1:  FENTANYL 50 MCG/HR  PT72 change patch every three days  Medication #2:  ENDOCET 10-325 MG  TABS Take one tablet every 6-8 hours as needed for pain. last OV 08-03-08, last filled oxycodone #120, fentanyl #10......................Marland KitchenFelecia Deloach CMA  November 11, 2008 12:08 PM   Follow-up for Phone Call        ok to refill both x1 Follow-up by: Loreen Freud DO,  November 11, 2008 1:03 PM  Additional Follow-up for Phone Call Additional follow up Details #1::        pt aware.Felecia Deloach CMA  November 11, 2008 2:31 PM    Prescriptions: FENTANYL 50 MCG/HR  PT72 (FENTANYL) change patch every three days  #10 x 0   Entered by:   Jeremy Johann CMA   Authorized by:   Loreen Freud DO   Signed by:   Jeremy Johann CMA on 11/11/2008   Method used:   Print then Give to Patient   RxID:   [id number] ENDOCET 10-325 MG  TABS (OXYCODONE-ACETAMINOPHEN) Take one tablet every 6-8 hours as needed for pain.  #120 x 0   Entered by:   Jeremy Johann CMA   Authorized by:   Loreen Freud DO   Signed by:   Jeremy Johann CMA on 11/11/2008   Method used:   Print then Give to Patient   RxID:   [id number]

## 2010-04-18 NOTE — Procedures (Signed)
Summary: Endoscopy  Endoscopy   Imported By: Marylou Mccoy 02/08/2010 08:24:40  _____________________________________________________________________  External Attachment:    Type:   Image     Comment:   External Document

## 2010-04-18 NOTE — Progress Notes (Signed)
Summary: LOWNE-REFILL  Phone Note Refill Request   Refills Requested: Medication #1:  CYCLOBENZAPRINE HCL 10 MG  TABS Take one tablet three times daily  Medication #2:  GABAPENTIN 600 MG  TABS Take one tablet 4 times daily [phone]----------LAST FILLED-10-24-07  Initial call taken by: Freddy Jaksch,  November 20, 2007 9:49 AM      Prescriptions: GABAPENTIN 600 MG  TABS (GABAPENTIN) Take one tablet 4 times daily  #120 x 1   Entered by:   Kandice Hams   Authorized by:   Loreen Freud DO   Signed by:   Kandice Hams on 11/20/2007   Method used:   Faxed to ...       [facility] (retail)       [address]       [place], [place]  [address]       Ph: [phone]       Fax: [phone]   RxID:   [id number] CELEBREX 200 MG  CAPS (CELECOXIB) Take one tablet twice daily  #60 x 3   Entered by:   Kandice Hams   Authorized by:   Loreen Freud DO   Signed by:   Kandice Hams on 11/20/2007   Method used:   Faxed to ...       [facility] (retail)       [address]       [place], [place]  [address]       Ph: [phone]       Fax: [phone]   RxID:   [id number]

## 2010-04-18 NOTE — Progress Notes (Signed)
Summary: dr. Blossom Hoops  Phone Note Call from Patient Call back at Home Phone [phone]   Caller: Patient Reason for Call: Talk to Nurse Summary of Call: dr. Blossom Hoops pt would like to have a gernic of the fosmax to her pharmacy. [facility] Initial call taken by: Charolette Child,  February 20, 2007 4:58 PM  Follow-up for Phone Call        Sent new rx per Dr. Blossom Hoops for Fosamax 70 telephoned in for #4 w/12 refills. ...................................................................Ardyth Man  February 20, 2007   Patient aware ...................................................................Ardyth Man  February 20, 2007 5:15 PM  Follow-up by: Ardyth Man,  February 20, 2007 5:15 PM    New/Updated Medications: FOSAMAX 70 MG  TABS (ALENDRONATE SODIUM) Take as directed weekly   Prescriptions: FOSAMAX 70 MG  TABS (ALENDRONATE SODIUM) Take as directed weekly  #4 x 12   Entered by:   Ardyth Man   Authorized by:   Leanne Chang MD   Signed by:   Ardyth Man on 02/20/2007   Method used:   Telephoned to ...       [facility]       [address]       [place], [place]  [address]       Ph: [phone]       Fax: [phone]   RxID:   [id number]

## 2010-04-18 NOTE — Miscellaneous (Signed)
Summary: BONE DENSITY  Clinical Lists Changes  Orders: Added new Test order of T-Bone Densitometry (77080) - Signed 

## 2010-04-18 NOTE — Progress Notes (Signed)
Summary: LOWNE TEMAZEPAM  Phone Note Refill Request   Refills Requested: Medication #1:  TEMAZEPAM 15 MG  CAPS Take one to two capsules at bedtime [facility]-[phone]  Initial call taken by: Freddy Jaksch,  October 04, 2008 9:12 AM  Follow-up for Phone Call        LAST REFILL #60 X 0 ON 09/02/08, LAST OV 06/03/08 Follow-up by: Kandice Hams,  October 04, 2008 4:47 PM  Additional Follow-up for Phone Call Additional follow up Details #1::        ok to refill Additional Follow-up by: Loreen Freud DO,  October 04, 2008 5:02 PM    Prescriptions: TEMAZEPAM 15 MG  CAPS (TEMAZEPAM) Take one to two capsules at bedtime  #60 x 0   Entered by:   Jeremy Johann CMA   Authorized by:   Loreen Freud DO   Signed by:   Jeremy Johann CMA on 10/05/2008   Method used:   Printed then faxed to ...       [facility] (retail)       [address]       [place], [place]  [address]       Ph: [phone]       Fax: [phone]   RxID:   [id number]

## 2010-04-18 NOTE — Progress Notes (Signed)
Summary: REFILL  Phone Note Refill Request Message from:  Fax from Pharmacy on May 09, 2009 12:43 PM  Refills Requested: Medication #1:  CYCLOBENZAPRINE HCL 10 MG  TABS Take one tablet three times daily [facility] FAX [phone]   Method Requested: Fax to Local Pharmacy Next Appointment Scheduled: FEB 28,2011 Initial call taken by: Barb Merino,  May 09, 2009 12:44 PM  Follow-up for Phone Call        last ov- 02/15/09, last fill- 10/19/08. Army Fossa CMA  May 09, 2009 1:51 PM   Additional Follow-up for Phone Call Additional follow up Details #1::        ok to refill and put 3 refills on it Additional Follow-up by: Loreen Freud DO,  May 09, 2009 2:54 PM    Prescriptions: CYCLOBENZAPRINE HCL 10 MG  TABS (CYCLOBENZAPRINE HCL) Take one tablet three times daily  #90 x 3   Entered by:   Army Fossa CMA   Authorized by:   Loreen Freud DO   Signed by:   Army Fossa CMA on 05/09/2009   Method used:   Electronically to        [facility] (retail)       [address]       [place], [place]  [address]       Ph: [phone]       Fax: [phone]   RxID:   [id number]

## 2010-04-18 NOTE — Discharge Summary (Signed)
Summary: Abdominal Adhesions    NAME:  [name], [name]               ACCOUNT NO.:  [id number]      MEDICAL RECORD NO.:  [id number]          PATIENT TYPE:  INP      LOCATION:  5124                         FACILITY:  [facility]      PHYSICIAN:  Juanetta Gosling, MDDATE OF BIRTH:  1946-03-05      DATE OF ADMISSION:  04/27/2009   DATE OF DISCHARGE:  05/06/2009                                  DISCHARGE SUMMARY      ADMISSION DIAGNOSES:   1. History of abdominal adhesions.   2. Iron-deficiency anemia.   3. Fibromyalgia.      DISCHARGE DIAGNOSES:   1. History of abdominal adhesions.   2. Iron-deficiency anemia.   3. Fibromyalgia.      PROCEDURES:   1. Exploratory laparotomy and lysis of adhesions x4 hours and a 28-       French gastrostomy tube placement.      HISTORY AND HOSPITAL COURSE:  Ms. Cullens is a 65 year old female with   longstanding surgical history, last been adhesiolysis small bowel   resection by Dr. Leroy Sea in a number of years ago.  She had been   followed with a current partial small bowel obstruction for length of   time where she was down to about 89 pounds.  She is now up to about 100   pounds.  She subsequently underwent an evaluation by Dr. [name]   showing a CT enterography with what appears to be a partial small bowel   obstruction.  I sent her for barium enema to ensure that her ileosigmoid   anastomosis was open and this was appeared to be fairly normal.  She   continues to be maintained via fluid through her PICC line and can only   eat occasionally.  She certainly had some motility issues but did appear   both clinically and on Radiology to have a chronic high-grade small   bowel obstruction.  I had a long discussion with her husband about going   to the operating room.  She eventually underwent an exploratory   laparotomy with 4 hours of lysis of adhesions and a gastrostomy tube   placed and her abdomen was indeed frozen once I was able to  mobilize and   her bowel was unable to be separated from each other.  Postoperatively,   she was sore and her pain was maintained on PCA.  I took her NG tube out   the following day and Konrad Dolores G-tube to drainage over this time.  Really   over the next several days, she did not have any return of bowel   function.  We  were just awaiting her ileus to resolve.  I did start TPN   on postoperative day 2 due to my expected prolonged ileus.  Her Foley   was discontinued and I restarted her heparin after she had some oozing   from her wound that day as well.  By postoperative day #4, she continued   to have no return of her bowel function and  eventually had another PICC   line placed and had her old PICC line removed due to the fact that it   was not working.  On postoperative day #6, she was passing flatus, had a   small bowel movement.  I clamped her gastrostomy tube at that point   which she tolerated very well.  I then began to advance her diet with   clear liquids.  I did keep her on TNA.  At this point, eventually she   was half rated on her TNA with a full liquids and a regular diet which   she tolerated very well.  She was then discharged home with some   assistance.      DISCHARGE INSTRUCTIONS:  She was waiting home health to come with her to   do her PICC flushes in PICC line care.  She was not to do any heavy   lifting for 4 weeks.  She was due to follow up with me in 1 week in my   office.      MEDICATIONS UPON DISCHARGE:   1. Fentanyl 550 mcg transdermal every 3 days.   2. Oxycodone 10/325 one every 6-8 hours.   3. Gabapentin 600 q.i.d.   4. Flecainide 50 mg 1 q.a.m. and half q.p.m.   5. Meloxicam.   6. Omeprazole 40 mg daily.   7. Cyclobenzaprine.   8. Ondansetron as needed.   9. Amlodipine 10 mg daily.   10.Temazepam 50 mg every night.   11.Spiriva.   12.She was also placed on MiraLax as needed.      Her pertinent laboratory evaluation showed a initial hematocrit of 34.5    and on discharge it was 31.2.  Her white blood cell count remained   normal throughout her hospital stay as did her creatinine.  Her albumin   upon discharge was 1.9.  Pertinent radiologic evaluation showed COPD no   acute disease and a PICC line in place after she had placed on May 02, 2009.               Juanetta Gosling, MD            MCW/MEDQ  D:  06/02/2009  T:  06/03/2009  Job:  [id number]      Electronically Signed by Emelia Loron MD on 06/03/2009 06:17:10 PM

## 2010-04-18 NOTE — Progress Notes (Signed)
Summary: home health serivces  Phone Note Other Incoming   Call placed by: Ardyth Man,  November 20, 2006 2:34 PM Call placed to: Insurer Summary of Call: dr. Blossom Hoops [phone] candy from [employer]  wanted to talk to dr. Laqueta Linden nurse about pt getting home health serivces. Initial call taken by: Charolette Child,  November 20, 2006 2:23 PM  Follow-up for Phone Call        I spoke w/Candy case mgr for [facility] for home health svc's, has been receiving care for her pic line through [facility] since May, and a nurse is going in every week to change dressing. Candy would like to know what the pic line is for and if it is being used.  Because it is being flushed every week and dressing changed. ...................................................................Ardyth Man  November 20, 2006 2:37 PM  Follow-up by: Ardyth Man,  November 20, 2006 2:37 PM  Additional Follow-up for Phone Call Additional follow up Details #1::        I spoke w/ Dr. Blossom Hoops and pt. GI Dr. Juanda Chance is the one that Candy needs to speak with. Candy aware ...................................................................Ardyth Man  November 20, 2006 2:48 PM  Additional Follow-up by: Ardyth Man,  November 20, 2006 2:48 PM

## 2010-04-18 NOTE — Progress Notes (Signed)
Summary: lowne----rx  Phone Note Refill Request   Refills Requested: Medication #1:  GABAPENTIN 600 MG  TABS Take one tablet 4 times daily [facility]--[phone]  Initial call taken by: Freddy Jaksch,  April 20, 2008 2:58 PM  Follow-up for Phone Call        ok to refill with 5 refills Follow-up by: Loreen Freud DO,  April 20, 2008 5:06 PM      Prescriptions: GABAPENTIN 600 MG  TABS (GABAPENTIN) Take one tablet 4 times daily  #120 x 5   Entered by:   Kandice Hams   Authorized by:   Loreen Freud DO   Signed by:   Kandice Hams on 04/21/2008   Method used:   Faxed to ...       [facility] (retail)       [address]       [place], [place]  [address]       Ph: [phone]       Fax: [phone]   RxID:   [id number]

## 2010-04-18 NOTE — Progress Notes (Signed)
Summary: REFILL  Phone Note Refill Request  on [facility] FAX [phone]  [facility] 15MG   Initial call taken by: Barb Merino,  April 11, 2009 9:31 AM    Prescriptions: MOBIC 15 MG TABS (MELOXICAM) take 1/2 - 1 by mouth once daily  #30 x 2   Entered by:   Army Fossa CMA   Authorized by:   Loreen Freud DO   Signed by:   Army Fossa CMA on 04/11/2009   Method used:   Electronically to        [facility] (retail)       [address]       [place], [place]  [address]       Ph: [phone]       Fax: [phone]   RxID:   [id number]

## 2010-04-18 NOTE — Assessment & Plan Note (Signed)
Summary: roa 3 months,cbs   Vital Signs:  Patient Profile:   65 Years Old Female Weight:      91.38 pounds O2 Sat:      96 % Temp:     98.5 degrees F oral Pulse rate:   60 / minute Resp:     14 per minute BP sitting:   110 / 78  (right arm)  Pt. in pain?   no  Vitals Entered By: Ardyth Man (September 08, 2007 10:20 AM)                  PCP:  Laury Axon  Chief Complaint:  follow up 3 months.  History of Present Illness: Pt here f/u pneumonia.  Pt doing better at home.  She feels like she doesn't need O2 anymore.  Pt seeing Dr Juanda Chance and Dr Myna Hidalgo for Iron deficiency.   Pt also needs refill on Celebrex.   Hypertension Follow-Up      This is a 65 year old woman who presents for Hypertension follow-up.  The patient denies lightheadedness, urinary frequency, headaches, edema, impotence, rash, and fatigue.  The patient denies the following associated symptoms: chest pain, chest pressure, exercise intolerance, dyspnea, palpitations, syncope, leg edema, and pedal edema.  Compliance with medications (by patient report) has been near 100%.  The patient reports that dietary compliance has been good.  Adjunctive measures currently used by the patient include salt restriction.      Current Allergies: ! TALWIN ! SULFA  Past Medical History:    Reviewed history from 05/30/2007 and no changes required:       Anemia-iron deficiency       Hypertension       Osteoarthritis       GERD  Past Surgical History:    Reviewed history from 08/07/2007 and no changes required:       Intestinal resection (5times)       Hysterectomy-complete       Appendectomy       Bladder lift       Tonsillectomy       Lysis of intestinal adhesions-numerous surgeries       PICC Line   Family History:    Reviewed history from 08/07/2007 and no changes required:       Family History of Heart Disease: Father       Family History of CVA: Mother  Social History:    Reviewed history from 08/07/2007 and no  changes required:       Occupation: Disabled       Alcohol Use - no       Illicit Drug Use - no    Review of Systems      See HPI   Physical Exam  General:     Well-developed,well-nourished,in no acute distress; alert,appropriate and cooperative throughout examination Lungs:     Normal respiratory effort, chest expands symmetrically. Lungs are clear to auscultation, no crackles or wheezes. Heart:     normal rate, regular rhythm, and no murmur.   Extremities:     No clubbing, cyanosis, edema, or deformity noted with normal full range of motion of all joints.      Impression & Recommendations:  Problem # 1:  UNSPECIFIED BACTERIAL PNEUMONIA (ICD-482.9) xray reviewed rto 3 months-- to recheck cxr d/C O2  Problem # 2:  HYPERTENSION, BENIGN ESSENTIAL (ICD-401.1)  Her updated medication list for this problem includes:    Cardura 4 Mg Tabs (Doxazosin mesylate) .[name]... Take one tablet daily  Norvasc 10 Mg Tabs (Amlodipine besylate) .[name]... 1 by mouth qd  BP today: 110/78 Prior BP: 110/70 (08/26/2007)  Labs Reviewed: Creat: 0.9 (08/26/2007) Chol: 139 (03/11/2006)   HDL: 38.5 (03/11/2006)   LDL: 85 (03/11/2006)   TG: 76 (03/11/2006)   Problem # 3:  FIBROMYALGIA (ICD-729.1) refilled celebrex Her updated medication list for this problem includes:    Endocet 10-325 Mg Tabs (Oxycodone-acetaminophen) .[name]... Take one tablet every 6-8 hours as needed for pain.    Fentanyl 50 Mcg/hr Pt72 (Fentanyl) .[name]... Change patch every three days    Celebrex 200 Mg Caps (Celecoxib) .[name]... Take one tablet twice daily    Cyclobenzaprine Hcl 10 Mg Tabs (Cyclobenzaprine hcl) .[name]... Take one tablet three times daily   Problem # 4:  ANEMIA-IRON DEFICIENCY (ICD-280.9) Hgb: 10.0 (08/26/2007)   Hct: 29.6 (08/26/2007)   RDW: 12.5 (08/26/2007)   MCV: 86.9 (08/26/2007)   MCHC: 33.7 (08/26/2007) TSH: 3.21 (03/11/2006) per GI and Heme  Complete Medication List: 1)  Endocet 10-325 Mg Tabs  (Oxycodone-acetaminophen) .... Take one tablet every 6-8 hours as needed for pain. 2)  Fentanyl 50 Mcg/hr Pt72 (Fentanyl) .... Change patch every three days 3)  Omeprazole 20 Mg Cpdr (Omeprazole) .... Take one tablet daily as directed 4)  Cardura 4 Mg Tabs (Doxazosin mesylate) .... Take one tablet daily 5)  Enablex 7.5 Mg Tb24 (Darifenacin hydrobromide) .... Take daily as directed 6)  Amitriptyline Hcl 25 Mg Tabs (Amitriptyline hcl) .... Take 2 tablets every evening at bedtime 7)  Celebrex 200 Mg Caps (Celecoxib) .... Take one tablet twice daily 8)  Temazepam 15 Mg Caps (Temazepam) .... Take one to two capsules at bedtime 9)  Cyclobenzaprine Hcl 10 Mg Tabs (Cyclobenzaprine hcl) .... Take one tablet three times daily 10)  Gabapentin 600 Mg Tabs (Gabapentin) .... Take one tablet 4 times daily 11)  Mirapex 0.125 Mg Tabs (Pramipexole dihydrochloride) .[name].. 1 by mouth 2 hrs before bed time 12)  Norvasc 10 Mg Tabs (Amlodipine besylate) .[name].. 1 by mouth qd 13)  Promethazine Hcl 25 Mg Tabs (Promethazine hcl) .... Take one tablet 14-6 hrs as needed for nausea 14)  Fosamax 70 Mg Tabs (Alendronate sodium) .... Take as directed weekly 15)  Flecainide Acetate 50 Mg Tabs (Flecainide acetate) 16)  D/c O2     Prescriptions: D/C O2   #1 x 0   Entered and Authorized by:   Loreen Freud DO   Signed by:   Loreen Freud DO on 09/08/2007   Method used:   Print then Give to Patient   RxID:   [id number] CELEBREX 200 MG  CAPS (CELECOXIB) Take one tablet twice daily  #60 x 3   Entered and Authorized by:   Loreen Freud DO   Signed by:   Loreen Freud DO on 09/08/2007   Method used:   Electronically sent to ...       [facility]       [address]       [place], [place]  [address]       Ph: [phone]       Fax: [phone]   RxID:   [id number]  ]

## 2010-04-18 NOTE — Progress Notes (Signed)
Summary: ALEJANDRO-REFILL AMLODIPINE BESYLATE 10MG   Phone Note Refill Request   Refills Requested: Medication #1:  NORVASC 10 MG  TABS 1 by mouth qd BY [facility] [phone] LAST FILLED #30   Initial call taken by: Gwen Pounds,  March 28, 2007 9:53 AM      Prescriptions: NORVASC 10 MG  TABS (AMLODIPINE BESYLATE) 1 by mouth qd  #30 x 3   Entered by:   Ardyth Man   Authorized by:   Leanne Chang MD   Signed by:   Ardyth Man on 03/28/2007   Method used:   Telephoned to ...       [facility]       [address]       [place], [place]  [address]       Ph: [phone]       Fax: [phone]   RxID:   [id number]

## 2010-04-18 NOTE — Progress Notes (Signed)
Summary: lowne--result and question about her blood sugar  Phone Note Call from Patient Call back at Home Phone [phone]   Summary of Call: Pt is calling with a question about her blood sugar dropping and result of her lab work Initial call taken by: Freddy Jaksch,  August 28, 2007 10:41 AM  Follow-up for Phone Call        Patient aware of lab results and gets bowel obstruction when takes iron and with her pic-line she see's Raynelle Chary  at the [facility] and she usually gets an iron transfusion. Please advise. Ardyth Man  August 28, 2007 12:45 PM  Follow-up by: Ardyth Man,  August 28, 2007 12:45 PM  Additional Follow-up for Phone Call Additional follow up Details #1::        fax labs to Dr Gerilyn Pilgrim and I think she sees Dr Juanda Chance to. Additional Follow-up by: Loreen Freud DO,  August 28, 2007 2:13 PM    Additional Follow-up for Phone Call Additional follow up Details #2::    Done. Ardyth Man  August 28, 2007 2:31 PM

## 2010-04-18 NOTE — Letter (Signed)
Summary: [facility] / [facility]  [facility] / [facility]   Imported By: Lennie Odor 01/10/2010 10:10:03  _____________________________________________________________________  External Attachment:    Type:   Image     Comment:   External Document

## 2010-04-18 NOTE — Progress Notes (Signed)
Summary: ***TRIAGE*** SEVERE PAIN   Phone Note Call from Patient Call back at Home Phone [phone]   Caller: Spouse GEORGE Call For: [place] Reason for Call: Talk to Nurse Summary of Call: PT HAVING SEVERE ABD PAIN AND VOMITTING WOULD LIKE WIFE TO BE SEEN TODAY OR HE'LL NEED TO TAKE HER TO ER....CHART READING TO [facility] Initial call taken by: Tawni Levy,  Aug 07, 2007 2:32 PM  Follow-up for Phone Call        Per pt. husband pt. has had abd. pain and n/v since yesterday morning.Pain is getting worse and she continues to vomit. Pt. has a history of SBO. Pt. husband advised to take pt. directly to the emergency room for evaluation. He is aware I will also notify Dr.Cobey Raineri. Follow-up by: Laureen Ochs LPN,  Aug 07, 2007 3:03 PM  Additional Follow-up for Phone Call Additional follow up Details #1::        reviewed, I agree, will need OV if she is not admitted Additional Follow-up by: Hart Carwin MD,  Aug 07, 2007 11:53 PM

## 2010-04-18 NOTE — Progress Notes (Signed)
Summary: endocet and fentyl refill/please advise  Phone Note Refill Request Call back at Home Phone [phone] Message from:  Patient on September 24, 2007 8:53 AM  Refills Requested: Medication #1:  ENDOCET 10-325 MG  TABS Take one tablet every 6-8 hours as needed for pain.  Medication #2:  FENTANYL 50 MCG/HR  PT72 change patch every three days CALL WHEN READY TO PICK UP  Initial call taken by: Doristine Devoid,  September 24, 2007 8:54 AM  Follow-up for Phone Call        PLEASE ADVISE ON THIS RX PAIN MED.Kandice Hams  September 24, 2007 10:18 AM   Follow-up by: Kandice Hams,  September 24, 2007 10:18 AM  Additional Follow-up for Phone Call Additional follow up Details #1::        Ok to refill x1 for both Additional Follow-up by: Loreen Freud DO,  September 24, 2007 12:11 PM    Additional Follow-up for Phone Call Additional follow up Details #2::    Spoke with pt rx ready for pickup.Kandice Hams  September 24, 2007 2:41 PM  Follow-up by: Kandice Hams,  September 24, 2007 2:41 PM    Prescriptions: FENTANYL 50 MCG/HR  PT72 (FENTANYL) change patch every three days  #10 x 0   Entered by:   Kandice Hams   Authorized by:   Loreen Freud DO   Signed by:   Kandice Hams on 09/24/2007   Method used:   Print then Give to Patient   RxID:   [id number] ENDOCET 10-325 MG  TABS (OXYCODONE-ACETAMINOPHEN) Take one tablet every 6-8 hours as needed for pain.  #120 x 0   Entered by:   Kandice Hams   Authorized by:   Loreen Freud DO   Signed by:   Kandice Hams on 09/24/2007   Method used:   Print then Give to Patient   RxID:   [id number]

## 2010-04-18 NOTE — Letter (Signed)
Summary: [facility]   Imported By: Lanelle Bal 11/24/2009 13:12:35  _____________________________________________________________________  External Attachment:    Type:   Image     Comment:   External Document

## 2010-04-18 NOTE — Letter (Signed)
Summary: [facility]   Imported By: Lanelle Bal 07/26/2009 12:46:17  _____________________________________________________________________  External Attachment:    Type:   Image     Comment:   External Document

## 2010-04-18 NOTE — Progress Notes (Signed)
Summary: refill  Phone Note Refill Request Message from:  Fax from Pharmacy [facility] fax [phone]  amlodipine besylate 10mg    Initial call taken by: Barb Merino,  March 30, 2009 11:15 AM    Prescriptions: NORVASC 10 MG  TABS (AMLODIPINE BESYLATE) 1 by mouth qd  #30 x 3   Entered by:   Army Fossa CMA   Authorized by:   Loreen Freud DO   Signed by:   Army Fossa CMA on 03/30/2009   Method used:   Electronically to        [facility] (retail)       [address]       [place], [place]  [id number]       Ph: [phone]       Fax: [phone]   RxID:   [id number]

## 2010-04-18 NOTE — Letter (Signed)
Summary: EGD Instructions  [facility]  [address] [place], [place] [address]   Phone: [phone]  Fax: [phone]       [name]    07/05/1945    MRN: [id number]       Procedure Day /Date: Friday 02/03/10     Arrival Time: 9:00 am     Procedure Time: 10:00 am     Location of Procedure:                    _x  _ [facility] ( Outpatient Registration)   PREPARATION FOR PEG REPLACEMENT   On 02/03/10 THE DAY OF THE PROCEDURE:  1.   No solid foods, milk or milk products are allowed after midnight the night before your procedure.  2.   Do not drink anything colored red or purple.  Avoid juices with pulp.  No orange juice.  3.  You may drink clear liquids until 6:00 am, which is 4 hours before your procedure.                                                                                                CLEAR LIQUIDS INCLUDE: Water Jello Ice Popsicles Tea (sugar ok, no milk/cream) Powdered fruit flavored drinks Coffee (sugar ok, no milk/cream) Gatorade Juice: apple, white grape, white cranberry  Lemonade Clear bullion, consomm, broth Carbonated beverages (any kind) Strained chicken noodle soup Hard Candy   MEDICATION INSTRUCTIONS  Unless otherwise instructed, you should take regular prescription medications with a small sip of water as early as possible the morning of your procedure.               OTHER INSTRUCTIONS  You will need a responsible adult at least 66 years of age to accompany you and drive you home.   This person must remain in the waiting room during your procedure.  Wear loose fitting clothing that is easily removed.  Leave jewelry and other valuables at home.  However, you may wish to bring a book to read or an iPod/MP3 player to listen to music as you wait for your procedure to start.  Remove all body piercing jewelry and leave at home.  Total time from sign-in until discharge is approximately 2-3  hours.  You should go home directly after your procedure and rest.  You can resume normal activities the day after your procedure.  The day of your procedure you should not:   Drive   Make legal decisions   Operate machinery   Drink alcohol   Return to work  You will receive specific instructions about eating, activities and medications before you leave.    The above instructions have been reviewed and explained to me by  Lamona Curl CMA Duncan Dull)  January 23, 2010 2:41 PM     I fully understand and can verbalize these instructions _____________________________ Date _________

## 2010-04-18 NOTE — Assessment & Plan Note (Signed)
Summary: heart beating fast      Allergies Added:   Current Medications (verified): 1)  Foradil Aerolizer 12 Mcg Caps (Formoterol Fumarate) .... One Puff Two Times A Day 2)  Spiriva Handihaler 18 Mcg Caps (Tiotropium Bromide Monohydrate) .... One Puff Once Daily 3)  Ventolin Hfa 108 (90 Base) Mcg/act Aers (Albuterol Sulfate) .... Two Puffs Up To Four Times Per Day As Needed 4)  Endocet 10-325 Mg  Tabs (Oxycodone-Acetaminophen) .... Take One Tablet Every 6-8 Hours As Needed For Pain. 5)  Fentanyl 50 Mcg/hr  Pt72 (Fentanyl) .... Change Patch Every Three Days 6)  Mobic 15 Mg Tabs (Meloxicam) .... Take 1/2-1 Tablet Daily 7)  Pantoprazole Sodium 40 Mg Tbec (Pantoprazole Sodium) .... Take 1 Tablet By Mouth Once A Day 8)  Temazepam 15 Mg  Caps (Temazepam) .... Take One To Two Capsules At Bedtime 9)  Cyclobenzaprine Hcl 10 Mg  Tabs (Cyclobenzaprine Hcl) .... Take One Tablet Three Times Daily 10)  Gabapentin 600 Mg  Tabs (Gabapentin) .... Take One Tablet 4 Times Daily 11)  Zofran 8 Mg Tabs (Ondansetron Hcl) .[name].. 1 By Mouth Q12 H As Needed 12)  Resource Breeze  Liqd (Nutritional Supplements) .... Take 1 Can (250 Calories/can) Two Times A Day Per Peg Tube. Dx: 536.3-Gastroparesis, 787.01-Nausea and Vomiting 13)  Reglan 5 Mg Tabs (Metoclopramide Hcl) .... Take 1 Tablet By Mouth Three Times A Day Before Meals 14)  Aspirin 81 Mg Tbec (Aspirin) .[name].. 1 By Mouth Qd 15)  Furosemide 20 Mg Tabs (Furosemide) .[name].. 1 By Mouth Qd 16)  Nitrostat 0.4 Mg Subl (Nitroglycerin) .... By Mouth 17)  Amiodarone Hcl 200 Mg Tabs (Amiodarone Hcl) .[name].. 1 By Mouth Daily 18)  Carvedilol 6.25 Mg Tabs (Carvedilol) .... 2 By Mouth Daily 19)  Metroclopramide 5 Mg .... By Mouth Three Times A Day  Allergies (verified): 1)  ! Talwin 2)  ! Sulfa   PPM Specifications Following MD:  Sherryl Manges, MD     PPM Vendor:  [employer]     PPM Model Number:  [id number]     PPM Serial Number:  [id number] PPM DOI:  12/21/2009     PPM Implanting  MD:  Sherryl Manges, MD  Lead 1    Location: RA     DOI: 12/21/2009     Model #: [id number]     Serial #: [id number]     Status: active Lead 2    Location: RV     DOI: 12/21/2009     Model #: [id number]     Serial #: [id number]     Status: active Lead 3    Location: LV     DOI: 12/21/2009     Model #: [id number]     Serial #: [id number]     Status: active  Magnet Response Rate:  BOL 85 ERI 65  Indications:  NICM A-fib    PPM Follow Up Battery Voltage:  3.07 V       PPM Device Measurements Atrium  Amplitude: 1.5 mV, Impedance: 437 ohms, Threshold: 1.50 V at 0.40 msec Right Ventricle  Amplitude: 16.9 mV, Impedance: 646 ohms, Threshold: 1.00 V at 0.40 msec Left Ventricle  Impedance: 779 ohms, Threshold: 1.00 V at 0.40 msec  Episodes MS Episodes:  0     Ventricular High Rate:  0     Atrial Pacing:  0.7%     Ventricular Pacing:  99.9%  Parameters Mode:  DDD     Lower Rate Limit:  60  Upper Rate Limit:  130 Paced AV Delay:  130     Sensed AV Delay:  100 Next Cardiology Appt Due:  01/04/2010 Tech Comments:  PT CALLED AND WAS HAVING FAST HEART RATE OR THUMPING.  NORMAL DEVICE FUNCTION.  PT FELT THUMPING WITH LV THRESHOLD TESTING.  LV THRESHOLD 1.00 @ 0.40--CHANGED LV OUTPUT TO 2.50 V.  ROV 01-04-10 @ 900. Vella Kohler  December 29, 2009 1:39 PM

## 2010-04-18 NOTE — Progress Notes (Signed)
Summary: refill - dr Desiree Lucy, FENTANYL  Phone Note Call from Patient Call back at Home Phone [phone]   Caller: Patient Summary of Call: rx for endocet 10/325mg  4 times a day --- fentanyl patch - call when ready  Initial call taken by: Okey Regal [place],  April 01, 2008 9:50 AM  Follow-up for Phone Call        last ov 03/05/08, last refill Fentanyl #10 x 0 03/02/08, last Endoce #120 x 0 03/02/08 Follow-up by: Kandice Hams,  April 01, 2008 4:50 PM  Additional Follow-up for Phone Call Additional follow up Details #1::        ok to refill both Additional Follow-up by: Loreen Freud DO,  April 01, 2008 5:18 PM    Additional Follow-up for Phone Call Additional follow up Details #2::    rx ready for pickup lrgy msg for pt Follow-up by: Kandice Hams,  April 02, 2008 8:13 AM    Prescriptions: FENTANYL 50 MCG/HR  PT72 (FENTANYL) change patch every three days  #10 x 0   Entered and Authorized by:   Loreen Freud DO   Signed by:   Loreen Freud DO on 04/01/2008   Method used:   Print then Give to Patient   RxID:   [id number] ENDOCET 10-325 MG  TABS (OXYCODONE-ACETAMINOPHEN) Take one tablet every 6-8 hours as needed for pain.  #120 x 0   Entered and Authorized by:   Loreen Freud DO   Signed by:   Loreen Freud DO on 04/01/2008   Method used:   Print then Give to Patient   RxID:   [id number]

## 2010-04-18 NOTE — Assessment & Plan Note (Signed)
Summary: MONTHLY B-12...LSW.  Nurse Visit     Allergies: 1)  ! Talwin 2)  ! Sulfa     Medication Administration  Injection # 1:    Medication: Vit B12 1000 mcg    Diagnosis: ANEMIA-IRON DEFICIENCY (ICD-280.9)    Route: IM    Site: R deltoid    Exp Date: 02/16/2010    Lot #: [id number]    Mfr: American Regent    Comments: Appt made for next monthly B12/   09-06-08    Patient tolerated injection without complications    Given by: Lowry Ram CMA (Aug 05, 2008 10:43 AM)  Orders Added: 1)  Vit B12 1000 mcg [J3420]

## 2010-04-18 NOTE — Medication Information (Signed)
Summary: Letter Regarding Polypharmacy in [facility]/[employer]  Letter Regarding [facility]   Imported By: Lanelle Bal 01/17/2010 11:56:57  _____________________________________________________________________  External Attachment:    Type:   Image     Comment:   External Document

## 2010-04-18 NOTE — Letter (Signed)
Summary: [facility]  [facility]   Imported By: Lanelle Bal 12/08/2008 09:07:30  _____________________________________________________________________  External Attachment:    Type:   Image     Comment:   External Document

## 2010-04-18 NOTE — Progress Notes (Signed)
Summary: Refills  Phone Note Refill Request   Refills Requested: Medication #1:  ENDOCET 10-325 MG  TABS Take one tablet every 6-8 hours as needed for pain.   Last Refilled: 05/12/2009  Medication #2:  FENTANYL 50 MCG/HR  PT72 change patch every three days   Last Refilled: 05/12/2009 last ov- 05/31/2009. Army Fossa CMA  June 15, 2009 7:58 AM    Follow-up for Phone Call        ok to refill x1 each Follow-up by: Loreen Freud DO,  June 15, 2009 10:49 AM  Additional Follow-up for Phone Call Additional follow up Details #1::        Pt aware, rxs are ready. Army Fossa CMA  June 15, 2009 11:30 AM     Prescriptions: FENTANYL 50 MCG/HR  PT72 (FENTANYL) change patch every three days  #10 x 0   Entered by:   Army Fossa CMA   Authorized by:   Loreen Freud DO   Signed by:   Army Fossa CMA on 06/15/2009   Method used:   Print then Give to Patient   RxID:   [id number] ENDOCET 10-325 MG  TABS (OXYCODONE-ACETAMINOPHEN) Take one tablet every 6-8 hours as needed for pain.  #120 x 0   Entered by:   Army Fossa CMA   Authorized by:   Loreen Freud DO   Signed by:   Army Fossa CMA on 06/15/2009   Method used:   Print then Give to Patient   RxID:   [id number]

## 2010-04-18 NOTE — Miscellaneous (Signed)
Summary: Overnight oximetry on room air   Clinical Lists Changes Test time 9hrs .  Mean SpO2 87%, low 80%.  Spent 5hrs 57 min with SpO2 < 88%.  Attempted to contact pt by phone to discuss.  Left message for patient to call back.  Will need to arrange for nocturnal home oxygen at 2 liters and repeat ONO with oxygen.

## 2010-04-18 NOTE — Assessment & Plan Note (Signed)
Summary: F/U APPT...LSW.    History of Present Illness Visit Type: Follow-up Visit Primary GI MD: [name] MD Primary Provider: Loreen Freud, DO Chief Complaint: [name] has had some small flares, but nothing major; tolerated last iron infusion well; has stiffness/pain in neck History of Present Illness:   This is a 65 year old white female with chronic partial small bowel obstructions for which she is status post exploratory laparotomy in February 2011 with lysis of adhesions and placement of a 76 French gastrostomy tube subsequently changed to 24 [place]. Patient has been using the gastrostomy tube for decompression. She uses it every 1-2 hours. She has a history of a partial colectomy in 2001 and prior exploratory laparotomy in 2003 for adhesions. Since the surgery in February, she has been on dietary supplements and her weight has slightly increased from 89 to 91 pounds. She received an iron infusion in May 2011. Her hemoglobin has increased from 9.2 to 10. She is on B12 supplements monthly.   GI Review of Systems      Denies abdominal pain, acid reflux, belching, bloating, chest pain, dysphagia with liquids, dysphagia with solids, heartburn, loss of appetite, nausea, vomiting, vomiting blood, weight loss, and  weight gain.        Denies anal fissure, black tarry stools, change in bowel habit, constipation, diarrhea, diverticulosis, fecal incontinence, heme positive stool, hemorrhoids, irritable bowel syndrome, jaundice, light color stool, liver problems, rectal bleeding, and  rectal pain.    Current Medications (verified): 1)  Spiriva Handihaler 18 Mcg Caps (Tiotropium Bromide Monohydrate) .... One Puff Once Daily 2)  Ventolin Hfa 108 (90 Base) Mcg/act Aers (Albuterol Sulfate) .... Two Puffs Up To Four Times Per Day As Needed 3)  Endocet 10-325 Mg  Tabs (Oxycodone-Acetaminophen) .... Take One Tablet Every 6-8 Hours As Needed For Pain. 4)  Fentanyl 50 Mcg/hr  Pt72 (Fentanyl) ....  Change Patch Every Three Days 5)  Mobic 15 Mg Tabs (Meloxicam) .... Take 1/2 - 1 By Mouth Once Daily 6)  Prilosec 40 Mg Cpdr (Omeprazole) .... Take 1 Tablet By Mouth Once A Day 7)  Norvasc 5 Mg Tabs (Amlodipine Besylate) .[name].. 1 By Mouth Once Daily 8)  Flecainide Acetate 50 Mg  Tabs (Flecainide Acetate) .[name].. 1 Tablet By Mouth Every Morning and 1/2 Tablet Po Every Night 9)  Temazepam 15 Mg  Caps (Temazepam) .... Take One To Two Capsules At Bedtime 10)  Cyclobenzaprine Hcl 10 Mg  Tabs (Cyclobenzaprine Hcl) .... Take One Tablet Three Times Daily 11)  Gabapentin 600 Mg  Tabs (Gabapentin) .... Take One Tablet 4 Times Daily 12)  Zofran 8 Mg Tabs (Ondansetron Hcl) .[name].. 1 By Mouth Q12 H As Needed 13)  Foradil Aerolizer 12 Mcg Caps (Formoterol Fumarate) .... One Puff Two Times A Day 14)  Resource Breeze  Liqd (Nutritional Supplements) .... Take 1 Can (250 Calories/can) Two Times A Day Per Peg Tube. Dx: 536.3-Gastroparesis, 787.01-Nausea and Vomiting 15)  Reglan 5 Mg Tabs (Metoclopramide Hcl) .... Take 1 Tablet By Mouth Three Times A Day Before Meals  Allergies (verified): 1)  ! Talwin 2)  ! Sulfa  Past History:  Past Medical History: Reviewed history from 02/17/2009 and no changes required. Anemia-iron deficiency CHRONIC PARTIAL SMALL BOWEL OBSTRUCTION Hypertension Osteoarthritis GERD CAD Left bundle branch block Atrial fibrillation - Dr. Roger Shelter Chronic pain Fibromyalgia Diverticulosis, colon COPD/Emphysema      -06/28/08 PFT FEV1 1.95 (97%), FVC 2.79 (101%), FEV1% 70, TLC 3.88 (87%), DLCO 64% Pneumonia      -  03/09, 12/09, 02/10, 06/10 Exudative pleural effusion s/p right thoracentesis 03/09  Past Surgical History: Reviewed history from 09/07/2009 and no changes required. Intestinal resection (5times),S/P PARTIAL COLECTOMY 2001 Hysterectomy-complete Appendectomy Bladder lift Tonsillectomy Lysis of intestinal adhesions-numerous surgeries/LAST 2003 PICC  Line TAH Intestinal surgery again 04/27/2009 06/2009--changed colostomy tube---Dr Dwain Sarna  Family History: Reviewed history from 05/12/2008 and no changes required. Family History of Heart Disease: Father Family History of CVA: Mother No FH of Colon Cancer:  Social History: Reviewed history from 04/07/2009 and no changes required. Occupation: Disabled Alcohol Use - no Illicit Drug Use - no Patient is a former smoker, quit 1988 x103yrs, <1ppd.  Daily Caffeine Use  Review of Systems       The patient complains of anemia, arthritis/joint pain, back pain, fatigue, and muscle pains/cramps.  The patient denies allergy/sinus, anxiety-new, blood in urine, breast changes/lumps, change in vision, confusion, cough, coughing up blood, depression-new, fainting, fever, headaches-new, hearing problems, heart murmur, heart rhythm changes, itching, menstrual pain, night sweats, nosebleeds, pregnancy symptoms, shortness of breath, skin rash, sleeping problems, sore throat, swelling of feet/legs, swollen lymph glands, thirst - excessive , urination - excessive , urination changes/pain, urine leakage, vision changes, and voice change.         .  Vital Signs:  Patient profile:   65 year old female Height:      62 inches Weight:      91 pounds BMI:     16.70 Pulse rate:   72 / minute Pulse rhythm:   regular BP sitting:   94 / 62  (left arm) Cuff size:   regular  Vitals Entered By: June McMurray CMA Duncan Dull) (September 20, 2009 11:19 AM)  Physical Exam  General:  thin, chronically ill-appearing. Eyes:  nonicteric. Mouth:  normal mucosa. Neck:  Supple; no masses or thyromegaly. Lungs:  Clear throughout to auscultation. Heart:  Regular rate and rhythm; no murmurs, rubs,  or bruits. Abdomen:  soft abdomen the gastrostomy on appliance and left middle quadrant. Increased tympany. Minimal tenderness around the gastrostomy. No palpable mass, no succussion splash. Extremities:  No clubbing, cyanosis, edema  or deformities noted. Skin:  Intact without significant lesions or rashes. Psych:  Alert and cooperative. Normal mood and affect.   Impression & Recommendations:  Problem # 1:  NAUSEA AND VOMITING (ICD-787.01) Patient's nausea and vomiting has stopped since she can decompress her stomach with a gastrostomy tube. She is doing it 10-15 times a day and I am concerned that she may be dehydrating herself and she is not getting benefit of the food she eats. She reports increased abdominal pressure after eating which necessitates her decompressing her stomach with draining of her gastrostomy tube. I asked her to quantitate the the volume that she drains out every day. We will check her electrolytes today as well as a CBC and iron studies.  Problem # 2:  B12 DEFICIENCY (ICD-266.2) Patient is on a monthly B12 supplement. We will check her iron studies and CBC today.  Problem # 3:  ABDOMINAL PAIN, CHRONIC (ICD-789.00) Patient is on the fentanyl patch 50 mcg/72 hours and Endocet. These medications are most likely slowing down her GI motility and should be reduced to minimum dosing.  Problem # 4:  BOWEL OBSTRUCTION (ICD-560.9) Patient has partial low-grade small bowel obstructions not relieved the by the exportory laparotomy due to too many adhesions and a complicated anatomy which did not allow complete relief of the obstruction. Orders: TLB-CBC Platelet - w/Differential (85025-CBCD) TLB-IBC Pnl (Iron/FE;Transferrin) (  83550-IBC) TLB-CMP (Comprehensive Metabolic Pnl) (80053-COMP)  Patient Instructions: 1)  Please go to the basement floor of our office today for labs including a CMET, CBC and IBC panel. 2)  try to quantitate the gastric drainage daily. 3)  Continue B12 supplements 4)  Consider G-tube study to assess small bowel obstruction. 5)  Copy sent to: Dr Johney Frame, Dr Loreen Freud, Dr Dwain Sarna 6)  The medication list was reviewed and reconciled.  All changed / newly prescribed medications  were explained.  A complete medication list was provided to the patient / caregiver.

## 2010-04-18 NOTE — Letter (Signed)
Summary: External Correspondence--[employer]  External Correspondence--[employer]   Imported By: Freddy Jaksch 11/06/2006 13:00:13  _____________________________________________________________________  External Attachment:    Type:   Image     Comment:   External Document

## 2010-04-18 NOTE — Assessment & Plan Note (Signed)
Summary: MONTHLY B-12/YF  Nurse Visit     Allergies: 1)  ! Talwin 2)  ! Sulfa     Medication Administration  Injection # 1:    Medication: Vit B12 1000 mcg    Diagnosis: ANEMIA-IRON DEFICIENCY (ICD-280.9)    Route: IM    Site: R deltoid    Exp Date: 02/16/2010    Lot #: [id number]    Mfr: [employer] Regent    Patient tolerated injection without complications    Given by: Harlow Mares CMA (July 05, 2008 11:06 AM)

## 2010-04-18 NOTE — Progress Notes (Signed)
Summary: ALEJANDRO-REFILL REQUEST CELEBREX 200MG   Phone Note Refill Request   Refills Requested: Medication #1:  CELEBREX 200 MG  CAPS Take once daily RECD BY [facility] [phone] LAST FILLED #60 ON 8.30.08  Initial call taken by: Gwen Pounds,  January 13, 2007 11:09 AM      Prescriptions: CELEBREX 200 MG  CAPS (CELECOXIB) Take once daily  #30 x 2   Entered by:   Ardyth Man   Authorized by:   Leanne Chang MD   Signed by:   Ardyth Man on 01/13/2007   Method used:   Telephoned to ...       [facility]       [address]       [place], [place]  [address]  [place]       Ph: [phone]       Fax: [phone]   RxID:   [id number]

## 2010-04-18 NOTE — Procedures (Signed)
Summary: PEG Replacement  Patient: [name] Note: All result statuses are Final unless otherwise noted.  Tests: (1) PEG Replacement (PEG)   PEG PEG Replacement       DONE     [facility]     [address] [place], [place]  [address]           OPERATIVE PROCEDURE REPORT           PATIENT:  [name], [name]  MR#:  [id number]     BIRTHDATE:  February 18, 1946  GENDER:  female           ENDOSCOPIST:  Hedwig Morton. Juanda Chance, MD     ASSISTANT:           PROCEDURE DATE:  02/03/2010     PROCEDURE:  Percutaneous PEG Replacement     ASA CLASS:  Class II     INDICATIONS:  leakage around the gastrostomy           MEDICATIONS:   none     TOPICAL ANESTHETIC:  none           DESCRIPTION OF PROCEDURE:  A history and physical had previously     been performed.  The procedure, indications, potential     complications (bleeding, perforation, infection, adverse     medication reaction), and alternatives were explained to the     patient who appeared to understand.  Opportunity for questions was     provided and informed consent was obtained.  The existing PEG tube     was removed using traction technique. after deflating the     retention baloon containing 10cc's of saline.  A 31F 2.5 cm button     PEG gastrostomy tube was inserted using the existing tract. The     G-tube insertion site was then cleansed,    A sterile dressing was     then applied, and the procedure terminated. Place,emt was checked     by gravity feeding           COMPLICATIONS:  None           ENDOSCOPIC IMPRESSION:     31F 2.5 cm button PEG     RECOMMENDATIONS:     1) follow PEG suggestions     may use the tube for decompression right away           REPEAT EXAM:  1 year(s) for.           __________________________________     Hedwig Morton. Juanda Chance, MD           CC:  Loreen Freud, DO, Dr Dwain Sarna           n.     eSIGNED:   Hedwig Morton. Brodie at 02/03/2010 10:45 AM           Montez Morita, [id number]  Note: An  exclamation mark (!) indicates a result that was not dispersed into the flowsheet. Document Creation Date: 02/03/2010 10:46 AM _______________________________________________________________________  (1) Order result status: Final Collection or observation date-time: 02/03/2010 10:34 Requested date-time:  Receipt date-time:  Reported date-time:  Referring Physician:   Ordering Physician: [name] [phone]) Specimen Source:  Source: Launa Grill Order Number: [id number] Lab site:

## 2010-04-18 NOTE — Progress Notes (Signed)
Summary: ALEJANDRO-CELEBREX DIRECTIONS NEED TO BE CORRECTED/michelle see  Phone Note Call from Patient   Caller: Patient Summary of Call: CELEBREX - AUG 1 AM 1 PM THEN WENT INTO [facility] SEPT TO [facility] WITHOUT TAKING NOW IT SHOWS ONLY ONCE A DAY.  SHE IS OUT EARLY B/C SHE HAS BEEN TAKING THE WAY SHE WAS ORIGINALLY TOLD TO TAKE THEM.  NEEDS THIS TO BE CORRECTED AT THE PHARMACY TO SHOW 1 AM 1 PM  [facility] [phone] Initial call taken by: Gwen Pounds,  April 09, 2007 12:24 PM  Follow-up for Phone Call        [facility], pt is only suppose to take 1 once daily right? That is the med directions I see...................................................................Marland KitchenKandice Hams  April 09, 2007 2:07 PM  Follow-up by: Kandice Hams,  April 09, 2007 2:07 PM    New/Updated Medications: CELEBREX 200 MG  CAPS (CELECOXIB) Take one tablet twice daily   Prescriptions: CELEBREX 200 MG  CAPS (CELECOXIB) Take one tablet twice daily  #60 x 3   Entered by:   Ardyth Man   Authorized by:   Leanne Chang MD   Signed by:   Ardyth Man on 04/09/2007   Method used:   Electronically sent to ...       [facility]       [address]       [place], [place]  [id number]       Ph: [phone]       Fax: [phone]   RxID:   [id number]

## 2010-04-18 NOTE — Letter (Signed)
Summary: Results Follow up Letter  [place] [facility]  [address] [place], [place] [address]   Phone: [phone]  Fax: [phone]    02/17/2009 MRN: [id number]  [facility] [place] [address] [place], [place]  [address]  Dear [name],  The following are the results of your recent test(s):  Test         Result    Pap Smear:        Normal _____  Not Normal _____ Comments: ______________________________________________________ Cholesterol: LDL(Bad cholesterol):         Your goal is less than:         HDL (Good cholesterol):       Your goal is more than: Comments:  ______________________________________________________ Mammogram:        Normal _____  Not Normal _____ Comments:  ___________________________________________________________________ Hemoccult:        Normal _____  Not normal _______ Comments:    _____________________________________________________________________ Other Tests:  See attachment for results.   We routinely do not discuss normal results over the telephone.  If you desire a copy of the results, or you have any questions about this information we can discuss them at your next office visit.   Sincerely,    Army Fossa CMA  February 17, 2009 8:23 AM

## 2010-04-18 NOTE — Assessment & Plan Note (Signed)
Summary: Monthly B12 injection  Nurse Visit   Allergies: 1)  ! Talwin 2)  ! Sulfa  Medication Administration  Injection # 1:    Medication: Vit B12 1000 mcg    Diagnosis: B12 DEFICIENCY (ICD-266.2)    Route: IM    Site: R deltoid    Exp Date: 8/12    Lot #: [id number]    Mfr: [employer] Regent    Comments: next appt. made for 03/14/09    Patient tolerated injection without complications    Given by: Milford Cage NCMA (February 09, 2009 10:54 AM)  Orders Added: 1)  Vit B12 1000 mcg [J3420]

## 2010-04-18 NOTE — Progress Notes (Signed)
Summary: lmtc 03-22-08  Phone Note Outgoing Call   Call placed by: Jeremy Johann CMA,  March 22, 2008 3:30 PM Summary of Call: left message to call  office.  improvement of cxr Initial call taken by: Jeremy Johann CMA,  March 22, 2008 3:30 PM  Follow-up for Phone Call        pt aware Follow-up by: [facility] CMA,  March 22, 2008 3:37 PM

## 2010-04-18 NOTE — Progress Notes (Signed)
Summary: Refill Request  Phone Note Refill Request Message from:  Pharmacy on [facility] Fax #: [phone]  Refills Requested: Medication #1:  ZOFRAN 8 MG TABS 1 by mouth q12 h as needed   Dosage confirmed as above?Dosage Confirmed   Brand Name Necessary? No   Supply Requested: 1 month   Last Refilled: 04/04/2009 Next Appointment Scheduled: 3.15.11 Initial call taken by: Harold Barban,  May 25, 2009 8:55 AM  Follow-up for Phone Call        last ov 02/15/2009. Army Fossa CMA  May 25, 2009 8:59 AM   Additional Follow-up for Phone Call Additional follow up Details #1::        ok to refill x1  3 refills Additional Follow-up by: Loreen Freud DO,  May 25, 2009 9:00 AM    Prescriptions: ZOFRAN 8 MG TABS (ONDANSETRON HCL) 1 by mouth q12 h as needed  #20 x 3   Entered by:   Army Fossa CMA   Authorized by:   Loreen Freud DO   Signed by:   Army Fossa CMA on 05/25/2009   Method used:   Electronically to        [facility] (retail)       [address]       [place], [place]  [id number]       Ph: [phone]       Fax: [phone]   RxID:   [id number]

## 2010-04-18 NOTE — Progress Notes (Signed)
Summary: awaiting sig from [facility]  Phone Note Call from Patient Call back at [facility] Phone [phone]   Caller: Patient Summary of Call: PATIENT BROUGHT IN REQUEST TO RENEW HANDICAP STICKER--LISTED ALL OF HER MEDICAL PROBLEMS ON BACK OF ENVELOPE FROM [place] [facility] [employer]  PLEASE CALL [phone] WHEN READY FOR PICKUP  WILL PLACE IN PLASTIC FOLDER AND TAKE TO [facility] Initial call taken by: Jerolyn Shin,  July 17, 2007 12:27 PM  Follow-up for Phone Call        Awaiting Hopp's signature, placed on ledge Ardyth Man  July 17, 2007 12:40 PM  Follow-up by: Ardyth Man,  July 17, 2007 12:40 PM  Additional Follow-up for Phone Call Additional follow up Details #1::        Patient aware to pick up at front desk Ardyth Man  July 17, 2007 1:21 PM  Additional Follow-up by: Ardyth Man,  July 17, 2007 1:21 PM

## 2010-04-18 NOTE — Assessment & Plan Note (Signed)
Summary: NP follow up - PNA   Copy to:  n/a Primary Provider/Referring Provider:  Loreen Freud, DO  CC:  RLL PNA - was diagnosed while in [place] for vacation x5days.  History of Present Illness: 65  yo female with history of COPD and recurrent pneumonia.  April 07, 2009 --Presents for an acute office visit. Complains of prod cough, pain in lower right rib area, increased SOB x2.5weeks. OTC not working. Denies chest pain, dyspnea, orthopnea, hemoptysis, fever,  edema, headache,recent travel or antibiotics.  Is scheduled for colon surgery for adhesion and recurrent bowel obstructions. Has lost weight due to colon isssues. Has intermittent n/v.   November 01, 2009--Presents for a post Hospital visit. Pt was admitted 8/10-8/15 for RLL PNA and CHF.  She was on vacation in [place]. 1 Day after arriving on vacation she starting have increased dyspnea and cough. She went to ER and was admitted told she had fluid in lungs and RLL PNA. She receivied abx and nebs. She was discharged on Levaquin 750mg  x 5 days. Several of her meds were changed -b/p and heart meds. She has ov w/ Dr. Deborah Chalk her cardiologist this evening. She is feeling better but still weak. Cough and dyspnea are much better. We are requesting records from [place] hospital. Denies chest pain,  orthopnea, hemoptysis, fever, n/v/d, edema, headache.     Medications Prior to Update: 1)  Spiriva Handihaler 18 Mcg Caps (Tiotropium Bromide Monohydrate) .... One Puff Once Daily 2)  Ventolin Hfa 108 (90 Base) Mcg/act Aers (Albuterol Sulfate) .... Two Puffs Up To Four Times Per Day As Needed 3)  Endocet 10-325 Mg  Tabs (Oxycodone-Acetaminophen) .... Take One Tablet Every 6-8 Hours As Needed For Pain. 4)  Fentanyl 50 Mcg/hr  Pt72 (Fentanyl) .... Change Patch Every Three Days 5)  Mobic 15 Mg Tabs (Meloxicam) .... Take 1/2 - 1 By Mouth Once Daily 6)  Prilosec 40 Mg Cpdr (Omeprazole) .... Take 1 Tablet By Mouth Once A Day 7)  Norvasc 5 Mg Tabs (Amlodipine Besylate)  .[name].. 1 By Mouth Once Daily 8)  Flecainide Acetate 50 Mg  Tabs (Flecainide Acetate) .[name].. 1 Tablet By Mouth Every Morning and 1/2 Tablet Po Every Night 9)  Temazepam 15 Mg  Caps (Temazepam) .... Take One To Two Capsules At Bedtime 10)  Cyclobenzaprine Hcl 10 Mg  Tabs (Cyclobenzaprine Hcl) .... Take One Tablet Three Times Daily 11)  Gabapentin 600 Mg  Tabs (Gabapentin) .... Take One Tablet 4 Times Daily 12)  Zofran 8 Mg Tabs (Ondansetron Hcl) .[name].. 1 By Mouth Q12 H As Needed 13)  Foradil Aerolizer 12 Mcg Caps (Formoterol Fumarate) .... One Puff Two Times A Day 14)  Resource Breeze  Liqd (Nutritional Supplements) .... Take 1 Can (250 Calories/can) Two Times A Day Per Peg Tube. Dx: 536.3-Gastroparesis, 787.01-Nausea and Vomiting 15)  Reglan 5 Mg Tabs (Metoclopramide Hcl) .... Take 1 Tablet By Mouth Three Times A Day Before Meals  Allergies (verified): 1)  ! Talwin 2)  ! Sulfa  Past History:  Past Medical History: Last updated: 02/17/2009 Anemia-iron deficiency CHRONIC PARTIAL SMALL BOWEL OBSTRUCTION Hypertension Osteoarthritis GERD CAD Left bundle branch block Atrial fibrillation - Dr. Roger Shelter Chronic pain Fibromyalgia Diverticulosis, colon COPD/Emphysema      -06/28/08 PFT FEV1 1.95 (97%), FVC 2.79 (101%), FEV1% 70, TLC 3.88 (87%), DLCO 64% Pneumonia      - 03/09, 12/09, 02/10, 06/10 Exudative pleural effusion s/p right thoracentesis 03/09  Past Surgical History: Last updated: 09/07/2009 Intestinal resection (5times),S/P PARTIAL  COLECTOMY 2001 Hysterectomy-complete Appendectomy Bladder lift Tonsillectomy Lysis of intestinal adhesions-numerous surgeries/LAST 2003 PICC Line TAH Intestinal surgery again 04/27/2009 06/2009--changed colostomy tube---Dr Dwain Sarna  Family History: Last updated: 05/12/2008 Family History of Heart Disease: Father Family History of CVA: Mother No FH of Colon Cancer:  Social History: Last updated: 04/07/2009 Occupation: Disabled Alcohol  Use - no Illicit Drug Use - no Patient is a former smoker, quit 1988 x27yrs, <1ppd.  Daily Caffeine Use  Risk Factors: Caffeine Use: 1 (05/31/2009)  Risk Factors: Smoking Status: quit (05/31/2009)  Review of Systems      See HPI  Vital Signs:  Patient profile:   65 year old female Height:      62 inches Weight:      98.44 pounds BMI:     18.07 O2 Sat:      93 % on Room air Temp:     97.5 degrees F oral Pulse rate:   83 / minute BP sitting:   110 / 66  (right arm) Cuff size:   regular  Vitals Entered By: Boone Master CNA/MA (November 01, 2009 2:46 PM)  O2 Flow:  Room air CC: RLL PNA - was diagnosed while in [place] for vacation x5days Is Patient Diabetic? No Comments Medications reviewed with patient Daytime contact number verified with patient. Boone Master CNA/MA  November 01, 2009 2:46 PM    Physical Exam  Additional Exam:  General: frail female  HEENT: [place]/AT.  Mucus membranes pink/moist.  No JVD. Neuro: Awake, alert, oriented.  MAE, speech clear.  EOMI, PERRLA CV: S1S2 RRR, no murmurs/rubs/gallops.  Radial pulses +2.   Pulm: Coarse BS with no wheezing or crackles  GI: abdomen round/soft, non-tender to palpation.  BSx4 active. No organomegaly. Skin: no rashes or lesions Ext: warm/dry.  No edema      Impression & Recommendations:  Problem # 1:  PNEUMONIA (ICD-486)  Recent RLL PNA admitted on vacation in [place].  will try to obtain records.  recommend to finish Levaquin.  will repeat xray in 2 weeks.  REC:  Finish Levaquin as recommended .  Mucinex DM two times a day as needed cough/congestion follow up Dr Craige Cotta in 2 weeks with chest xray follow up Dr. Deborah Chalk this evening, discuss recent med changes.  Please contact office for sooner follow up if symptoms do not improve or worsen   Orders: Est. Patient Level III ([address])  Complete Medication List: 1)  Spiriva Handihaler 18 Mcg Caps (Tiotropium bromide monohydrate) .... One puff once daily 2)  Ventolin Hfa 108  (90 Base) Mcg/act Aers (Albuterol sulfate) .... Two puffs up to four times per day as needed 3)  Endocet 10-325 Mg Tabs (Oxycodone-acetaminophen) .... Take one tablet every 6-8 hours as needed for pain. 4)  Fentanyl 50 Mcg/hr Pt72 (Fentanyl) .... Change patch every three days 5)  Mobic 15 Mg Tabs (Meloxicam) .... Take 1/2 - 1 by mouth once daily 6)  Prilosec 40 Mg Cpdr (Omeprazole) .... Take 1 tablet by mouth once a day 7)  Norvasc 5 Mg Tabs (Amlodipine besylate) .[name].. 1 by mouth once daily 8)  Flecainide Acetate 50 Mg Tabs (Flecainide acetate) .[name].. 1 tablet by mouth every morning and 1/2 tablet po every night 9)  Temazepam 15 Mg Caps (Temazepam) .... Take one to two capsules at bedtime 10)  Cyclobenzaprine Hcl 10 Mg Tabs (Cyclobenzaprine hcl) .... Take one tablet three times daily 11)  Gabapentin 600 Mg Tabs (Gabapentin) .... Take one tablet 4 times daily 12)  Zofran 8  Mg Tabs (Ondansetron hcl) .[name].. 1 by mouth q12 h as needed 13)  Foradil Aerolizer 12 Mcg Caps (Formoterol fumarate) .... One puff two times a day 14)  Resource Breeze Liqd (Nutritional supplements) .... Take 1 can (250 calories/can) two times a day per peg tube. dx: 536.3-gastroparesis, 787.01-nausea and vomiting 15)  Reglan 5 Mg Tabs (Metoclopramide hcl) .... Take 1 tablet by mouth three times a day before meals   Patient Instructions: 1)  Finish Levaquin as recommended .  2)  Mucinex DM two times a day as needed cough/congestion 3)  follow up Dr Craige Cotta in 2 weeks with chest xray 4)  follow up Dr. Deborah Chalk this evening, discuss recent med changes.  5)  Please contact office for sooner follow up if symptoms do not improve or worsen

## 2010-04-18 NOTE — Procedures (Signed)
Summary: Upper Endoscopy  Patient: [name] Note: All result statuses are Final unless otherwise noted.  Tests: (1) Upper Endoscopy (EGD)   EGD Upper Endoscopy       DONE     [facility]     [address] [place], [place]  [address]           ENDOSCOPY PROCEDURE REPORT           PATIENT:  [name], [name]  MR#:  [id number]     BIRTHDATE:  07-19-1945, 64 yrs. old  GENDER:  female           ENDOSCOPIST:  Iva Boop, MD, [facility]     Referred by:  [facility]           PROCEDURE DATE:  01/07/2010     PROCEDURE:  EGD, diagnostic 46962     ASA CLASS:  Class III     INDICATIONS:  melena, dyspepsia also with odynophagia     problems began after starting Pradaxa           MEDICATIONS:   Fentanyl 75 mcg, Versed 8 mg     TOPICAL ANESTHETIC:  Cetacaine Spray           DESCRIPTION OF PROCEDURE:   After the risks benefits and     alternatives of the procedure were thoroughly explained, informed     consent was obtained.  The  endoscope was introduced through the     mouth and advanced to the second portion of the duodenum, without     limitations.  The instrument was slowly withdrawn as the mucosa     was fully examined.     <<PROCEDUREIMAGES>>           Multiple ulcers were found in the mid esophagus. Into distal     esophagus. Clean-based and large geographic ulcers with some     contact friability but no bleeding stigmata.  Post-operative     change was noted in the body of the stomach. Gastrostomy intact.     Otherwise the examination was normal.    Retroflexed views revealed     no abnormalities.    The scope was then withdrawn from the patient     and the procedure completed.           COMPLICATIONS:  None           ENDOSCOPIC IMPRESSION:     1) Ulcers, multiple in the mid esophagus - believe caused by     Pradaxa     2) Post-operative change in the body of the stomach -     gastrostomy tube     3) Otherwise normal examination      RECOMMENDATIONS:     1) Add carafate     2) No Pradaxa - time course and clinical scenario consistent     with adverse effects of Pradaxa     3) Other anti-coagulants not precluded if needed but would wait     2+ weeks to start     4) If ok tomorrow send to floor     5) Change PPI to po tomorrow if ok     6) Hold Mobic for now - restarting depends upon what other     anti-thrombotics used in future -  but it probably had little to     do with this           REPEAT EXAM:  In for as needed.           Iva Boop, MD, [facility]           CC:  Duke Salvia, MD           n.     Rosalie Doctor:   Iva Boop at 01/07/2010 05:12 PM           Montez Morita, [id number]  Note: An exclamation mark (!) indicates a result that was not dispersed into the flowsheet. Document Creation Date: 01/07/2010 5:13 PM _______________________________________________________________________  (1) Order result status: Final Collection or observation date-time: 01/07/2010 16:38 Requested date-time:  Receipt date-time:  Reported date-time:  Referring Physician:   Ordering Physician: Stan Head [phone]) Specimen Source:  Source: Launa Grill Order Number: [id number] Lab site:

## 2010-04-18 NOTE — Progress Notes (Signed)
Summary: 2 RX REFILLS --OXYCODONE AND FENTANYL PATCHES/[facility]  Phone Note Call from Patient Call back at Home Phone [phone]   Caller: Patient Call For: Loreen Freud DO Reason for Call: Refill Medication Summary of Call: PATIENT IS CALLING TO REQUEST RX'S FOR HER OXYCODONE & FENTANYL PATCHES.  PLEASE CALL PT WHEN READY FOR PICK-UP. Initial call taken by: Magdalen Spatz [facility],  December 13, 2008 9:22 AM  Follow-up for Phone Call        LAST OV 11/16/08, LAST REFILL FENTANYL PATCH #10 X 0 11/11/08/ LAST REFILL OXYCODONE #120 X 0 ON 11/11/08 Follow-up by: Kandice Hams,  December 13, 2008 4:54 PM  Additional Follow-up for Phone Call Additional follow up Details #1::        ok to refill both Additional Follow-up by: Loreen Freud DO,  December 13, 2008 4:59 PM    Additional Follow-up for Phone Call Additional follow up Details #2::    SPOKE WITHPT INFORMED RX READY FOR PICKUP Follow-up by: Kandice Hams,  December 14, 2008 11:18 AM  Prescriptions: ENDOCET 10-325 MG  TABS (OXYCODONE-ACETAMINOPHEN) Take one tablet every 6-8 hours as needed for pain.  #120 x 0   Entered by:   Kandice Hams   Authorized by:   Loreen Freud DO   Signed by:   Kandice Hams on 12/14/2008   Method used:   Print then Give to Patient   RxID:   [id number] FENTANYL 50 MCG/HR  PT72 (FENTANYL) change patch every three days  #10 x 0   Entered by:   Kandice Hams   Authorized by:   Loreen Freud DO   Signed by:   Kandice Hams on 12/14/2008   Method used:   Print then Give to Patient   RxID:   [id number]

## 2010-04-18 NOTE — Medication Information (Signed)
Summary: Prior Authorization for Ondansetron/[employer]  Prior Authorization for Ondansetron/[employer]   Imported By: Lanelle Bal 02/25/2009 09:59:05  _____________________________________________________________________  External Attachment:    Type:   Image     Comment:   External Document

## 2010-04-18 NOTE — Letter (Signed)
Summary: [facility]--Office [facility]  [facility]--Office Progress Note   Imported By: Freddy Jaksch 05/27/2007 11:13:39  _____________________________________________________________________  External Attachment:    Type:   Image     Comment:   External Document

## 2010-04-18 NOTE — Consult Note (Signed)
Summary: Vomiting  [name], [name]               ACCOUNT NO.:  [id number]      MEDICAL RECORD NO.:  [id number]          PATIENT TYPE:  INP      LOCATION:  1443                         FACILITY:  [facility]      PHYSICIAN:  Charlcie Cradle. Delford Field, MD, FCCPDATE OF BIRTH:  1945/04[birth year]      DATE OF CONSULTATION:  08/15/2007   DATE OF DISCHARGE:                                    CONSULTATION      A 65 year old white female with previous chronic medical problems   brought to the emergency room on Aug 07, 2007, after being found   unresponsive on the floor by her daughter, had an earlier episode of   emesis, found to be hypoglycemic and had complained of abdominal pain   for 3 days with vomiting yellow-green bilious material since 6 a.m. on   Aug 06, 2007.  She had been acting tired and more sleepy than usual   prior to this.  There had not been a bowel movement in 2 days.  She had   some headaches.  Abdominal pain was less and she was awake and alert on   arrival in Aug 07, 2007, with improved mental status following D50 x1   amp.  Subsequent to this there was infiltrate seen in the right lung and   left lung with right pleural effusion.  Subsequently had right pleural   effusion tapped which was exudative effusion.  Cultures of the blood are   negative.  The patient is improving on a course of Rocephin and   Zithromax with decreased shortness of breath and cough.  We were asked   to assess the bilateral infiltrates in the lung.      PAST MEDICAL HISTORY:  Medical history of fibromyalgia with chronic pain   syndrome and chronic narcotic use.  History of recurrent small bowel   obstruction, previous colon surgery, chronic PICC line placement,   history of osteoarthritis, chronic left bundle branch block, history of   coronary artery disease in the past with cath last January of 2008.      MEDICATIONS PRIOR TO ADMISSION:   1. Celebrex.   2. Cardura.   3. Flexeril.   4. Neurontin.   5.  Fentanyl patch.   6. Prilosec.   7. Flecainide.   8. Elavil.   9. Norvasc.   10.Mirapex.      ALLERGIES:  TALWIN, SULFA.      FAMILY HISTORY:  Is noncontributory.      SOCIAL HISTORY:  The patient lives with spouse at home, has daughters,   does not smoke or drink.      PHYSICAL EXAM:  GENERAL:  Now the patient is currently awake and alert.   Denies any active shortness of breath or cough.   CHEST:  Showed a few expiratory wheezes.  Decreased breath sounds.  Dry   rales at the right base.  Decreased breath sounds at right base.   CARDIAC:  Exam showed a regular rate and rhythm without S3.  Normal S1,   S2.  The patient is thin, alert.   ABDOMEN:  Soft.  Bowel sounds active.  Multiple abdominal scars noted.   NEUROLOGIC:  Intact.   EXTREMITIES:  Warm without edema and well-perfused.      Hemoglobin 10.3, white count 9000, platelet count 261,000, sodium 140,   potassium 3.8, chloride 110, CO2 21, BUN 13, creatinine 0.8, blood sugar   79.  CT scan of the chest and chest x-ray were reviewed and showed   bilateral infiltrates initially with right pleural effusion.  Now the   pleural effusion is last after thoracentesis.  The pleural fluid was an   exudate.  The LDH in the pleural fluid was 389, total protein less than   3.  Pathology showed mesothelial cells.  Urine culture positive for   Klebsiella.  Chest x-ray now shows improved infiltrates on the right and   left.      IMPRESSION:  Impression is that of bilateral air space disease.  My   suspicion is the patient had emesis with aspiration resulting in   aspiration pneumonia with right parapneumonic effusion.  These are all   now improving as the patient's mental status has improved.  I am   concerned about the patient's constellation of chronic pain syndrome   with chronic narcotics and Neurontin use in addition to the chronic   bowel dysfunction and tendency towards alterations in consciousness and   aspiration.  There does  not appear to be any evidence of transfer   dysphagia at this time.      RECOMMENDATIONS:  Would be to try to minimize narcotic use if at all   possible because of recurrence of aspiration that will be seen.  No   indication for bronchoscopy.  Will up titrate oxygen.  No indication for   systemic steroids.  Will give albuterol nebs and flutter valve for   pulmonary toilet and follow expectantly with chest x-ray to clear and   change to oral Avelox, discontinue Rocephin and Zithromax in this   patient now.               Charlcie Cradle Delford Field, MD, [facility]   Electronically Signed            PEW/MEDQ  D:  08/15/2007  T:  08/15/2007  Job:  [id number]      cc:   Leanne Chang, M.D.   Fax: [phone]      Colleen Can. Deborah Chalk, M.D.   Fax: [phone]      Hedwig Morton. Juanda Chance, MD   [address]   [place]   [place] [address]      Christeen Douglas, Dr

## 2010-04-18 NOTE — Progress Notes (Signed)
Summary: TRIAGE   Phone Note From Other Clinic Call back at [phone]   Caller: Duwayne Heck, nurse Call For: Dr. Juanda Chance Reason for Call: Schedule Patient Appt Summary of Call: Dr. Laury Axon would like pt seen asap for daily vomiting x3 months Initial call taken by: Vallarie Mare,  February 16, 2009 1:36 PM  Follow-up for Phone Call        Pt. will see Mike G[facility] on 02-17-09 at 9am. Duwayne Heck will advise pt. of appt/med.list.co-pay/cx.policy.  Follow-up by: Laureen Ochs LPN,  February 16, 2009 1:59 PM

## 2010-04-18 NOTE — Miscellaneous (Signed)
Summary: Plan of Treatment/[facility]  Plan of Treatment/[facility]   Imported By: Lanelle Bal 11/18/2008 12:18:01  _____________________________________________________________________  External Attachment:    Type:   Image     Comment:   External Document

## 2010-04-18 NOTE — Progress Notes (Signed)
Summary: [facility]--rx  Phone Note Refill Request   Refills Requested: Medication #1:  CARDURA 4 MG  TABS Take one tablet daily [facility]--[id number]--[id number] filled12.29.09  Initial call taken by: Freddy Jaksch,  April 14, 2008 4:38 PM      Prescriptions: CARDURA 4 MG  TABS (DOXAZOSIN MESYLATE) Take one tablet daily  #30 x 2   Entered by:   Shary Decamp   Authorized by:   Loreen Freud DO   Signed by:   Shary Decamp on 04/15/2008   Method used:   Electronically to        [facility] (retail)       [address]       [place], [place]  [id number]       Ph: [phone]       Fax: [phone]   RxID:   [id number]

## 2010-04-18 NOTE — Letter (Signed)
Summary: M/M Imaging Options/[facility]  M/M Imaging Options/[facility]   Imported By: Sherian Rein 02/21/2009 10:58:29  _____________________________________________________________________  External Attachment:    Type:   Image     Comment:   External Document

## 2010-04-18 NOTE — Consult Note (Signed)
Summary: Consultation Report-OFFICE  NOTE  Consultation Report-OFFICE  NOTE   Imported By: Vanessa Swaziland 12/10/2006 11:40:14  _____________________________________________________________________  External Attachment:    Type:   Image     Comment:   External Document

## 2010-04-18 NOTE — Progress Notes (Signed)
Summary: [facility]--Refill  Phone Note Refill Request   Refills Requested: Medication #1:  GABAPENTIN 600 MG  TABS Take one tablet 4 times daily   Last Refilled: 05/19/2007 Rx received via fax from [facility]  [phone]  Initial call taken by: Freddy Jaksch,  June 26, 2007 12:06 PM      Prescriptions: GABAPENTIN 600 MG  TABS (GABAPENTIN) Take one tablet 4 times daily  #120 x 3   Entered by:   Ardyth Man   Authorized by:   Loreen Freud DO   Signed by:   Ardyth Man on 06/26/2007   Method used:   Electronically sent to ...       [facility]       [address]       [place], [place]  [address]       Ph: [phone]       Fax: [phone]   RxID:   [id number]

## 2010-04-18 NOTE — Letter (Signed)
Summary: [facility]   Imported By: Sherian Rein 02/08/2009 15:16:41  _____________________________________________________________________  External Attachment:    Type:   Image     Comment:   External Document

## 2010-04-18 NOTE — Progress Notes (Signed)
Summary: refill  Phone Note Refill Request Call back at Home Phone [phone] Message from:  Patient  Refills Requested: Medication #1:  FENTANYL 50 MCG/HR  PT72 change patch every three days   Last Refilled: 06/15/2009  Medication #2:  ENDOCET 10-325 MG  TABS Take one tablet every 6-8 hours as needed for pain.   Dosage confirmed as above?Dosage Confirmed   Last Refilled: 06/15/2009 call when ready   Method Requested: Pick up at Office Initial call taken by: Okey Regal [place],  Jul 18, 2009 3:13 PM  Follow-up for Phone Call        refill x1 Follow-up by: Loreen Freud DO,  Jul 18, 2009 3:30 PM  Additional Follow-up for Phone Call Additional follow up Details #1::        Pt is aware rx is ready. Army Fossa CMA  Jul 18, 2009 3:39 PM     Prescriptions: FENTANYL 50 MCG/HR  PT72 (FENTANYL) change patch every three days  #10 x 0   Entered by:   Army Fossa CMA   Authorized by:   Loreen Freud DO   Signed by:   Army Fossa CMA on 07/18/2009   Method used:   Print then Give to Patient   RxID:   [id number] ENDOCET 10-325 MG  TABS (OXYCODONE-ACETAMINOPHEN) Take one tablet every 6-8 hours as needed for pain.  #120 x 0   Entered by:   Army Fossa CMA   Authorized by:   Loreen Freud DO   Signed by:   Army Fossa CMA on 07/18/2009   Method used:   Print then Give to Patient   RxID:   [id number]

## 2010-04-18 NOTE — Assessment & Plan Note (Signed)
Summary: MONTHLY B12 SHOT...LSW.  Nurse Visit   Allergies: 1)  ! Talwin 2)  ! Sulfa  Medication Administration  Injection # 1:    Medication: Vit B12 1000 mcg    Diagnosis: B12 DEFICIENCY (ICD-266.2)    Route: IM    Site: R deltoid    Exp Date: 10/18/2010    Lot #: [id number]    Mfr: [employer] Regent    Patient tolerated injection without complications    Given by: Lowry Ram NCMA (January 13, 2009 11:02 AM)  Orders Added: 1)  Vit B12 1000 mcg [J3420] Appt made for next Vit B12 02-09-09 at 11AM.

## 2010-04-18 NOTE — Assessment & Plan Note (Signed)
Summary: diarrhea//lch   Vital Signs:  Patient profile:   65 year old female Weight:      87.8 pounds BMI:     16.12 Temp:     98.0 degrees F oral Pulse rate:   92 / minute Pulse rhythm:   regular BP sitting:   88 / 58  (right arm) Cuff size:   regular  Vitals Entered By: Almeta Monas CMA Duncan Dull) (January 06, 2010 3:50 PM) CC: c/o no appetite x5days with diarrhea and abdominal pain x5days   History of Present Illness: Pt here with husband c/o NVD and abd pain for 5 days.  She has been unable to eat anything Since sunday.  She went to the ER 8---2 days after her pacemaker was but in----with swelling around the pacemaker site.  She was sent home and then started with N/VD-== Everytime pt eats she gets a pain in her chest and has some Diarrhea.    Preventive Screening-Counseling & Management  Alcohol-Tobacco     Smoking Status: quit     Year Quit: 1990  Caffeine-Diet-Exercise     Caffeine use/day: 1  Current Medications (verified): 1)  Foradil Aerolizer 12 Mcg Caps (Formoterol Fumarate) .... One Puff Two Times A Day 2)  Spiriva Handihaler 18 Mcg Caps (Tiotropium Bromide Monohydrate) .... One Puff Once Daily 3)  Ventolin Hfa 108 (90 Base) Mcg/act Aers (Albuterol Sulfate) .... Two Puffs Up To Four Times Per Day As Needed 4)  Endocet 10-325 Mg  Tabs (Oxycodone-Acetaminophen) .... Take One Tablet Every 6-8 Hours As Needed For Pain. 5)  Fentanyl 50 Mcg/hr  Pt72 (Fentanyl) .... Change Patch Every Three Days 6)  Mobic 15 Mg Tabs (Meloxicam) .... Take 1/2-1 Tablet Daily 7)  Pantoprazole Sodium 40 Mg Tbec (Pantoprazole Sodium) .... Take 1 Tablet By Mouth Once A Day 8)  Temazepam 15 Mg  Caps (Temazepam) .... Take One To Two Capsules At Bedtime 9)  Cyclobenzaprine Hcl 10 Mg  Tabs (Cyclobenzaprine Hcl) .... Take One Tablet Three Times Daily 10)  Gabapentin 600 Mg  Tabs (Gabapentin) .... Take One Tablet 4 Times Daily 11)  Zofran 8 Mg Tabs (Ondansetron Hcl) .[name].. 1 By Mouth Q12 H As  Needed 12)  Resource Breeze  Liqd (Nutritional Supplements) .... Take 1 Can (250 Calories/can) Two Times A Day Per Peg Tube. Dx: 536.3-Gastroparesis, 787.01-Nausea and Vomiting 13)  Reglan 5 Mg Tabs (Metoclopramide Hcl) .... Take 1 Tablet By Mouth Three Times A Day Before Meals 14)  Aspirin 81 Mg Tbec (Aspirin) .[name].. 1 By Mouth Qd 15)  Furosemide 20 Mg Tabs (Furosemide) .[name].. 1 By Mouth Qd 16)  Nitrostat 0.4 Mg Subl (Nitroglycerin) .... By Mouth 17)  Amiodarone Hcl 200 Mg Tabs (Amiodarone Hcl) .[name].. 1 By Mouth Daily 18)  Carvedilol 6.25 Mg Tabs (Carvedilol) .... 2 By Mouth Daily 19)  Metroclopramide 5 Mg .... By Mouth Three Times A Day  Allergies (verified): 1)  ! Talwin 2)  ! Sulfa  Past History:  Past Medical History: Last updated: 11/15/2009 Anemia-iron deficiency CHRONIC PARTIAL SMALL BOWEL OBSTRUCTION Hypertension Osteoarthritis GERD CAD Left bundle branch block Atrial fibrillation - Dr. Roger Shelter Chronic pain Fibromyalgia Diverticulosis, colon COPD/Emphysema      -06/28/08 PFT FEV1 1.95 (97%), FVC 2.79 (101%), FEV1% 70, TLC 3.88 (87%), DLCO 64% Pneumonia      - 03/09, 12/09, 02/10, 06/10 Exudative pleural effusion s/p right thoracentesis 03/09 RLL pneumonia 10/26/2009,  sepsis picc line, UTI,   hospitalization [place]  Family History: Last updated: 05/12/2008 Family  History of Heart Disease: Father Family History of CVA: Mother No FH of Colon Cancer:  Social History: Last updated: 04/07/2009 Occupation: Disabled Alcohol Use - no Illicit Drug Use - no Patient is a former smoker, quit 1988 x44yrs, <1ppd.  Daily Caffeine Use  Risk Factors: Caffeine Use: 1 (01/06/2010)  Risk Factors: Smoking Status: quit (01/06/2010)  Past Surgical History: Intestinal resection (5times),S/P PARTIAL COLECTOMY 2001 Hysterectomy-complete Appendectomy Bladder lift Tonsillectomy Lysis of intestinal adhesions-numerous surgeries/LAST 2003 PICC Line TAH Intestinal surgery again  04/27/2009 06/2009--changed colostomy tube---Dr Dwain Sarna Permanent pacemaker (12/22/2009)  Family History: Reviewed history from 05/12/2008 and no changes required. Family History of Heart Disease: Father Family History of CVA: Mother No FH of Colon Cancer:  Social History: Reviewed history from 04/07/2009 and no changes required. Occupation: Disabled Alcohol Use - no Illicit Drug Use - no Patient is a former smoker, quit 1988 x109yrs, <1ppd.  Daily Caffeine Use  Review of Systems      See HPI  Physical Exam  General:  underweight appearing, cachetic, and pale.   comfortable on exam table  Mouth:  mucous membranes dry   Abdomen:  some R mid and low quad abd pain no rebound  Skin:  decreased turgor and pale.   Cervical Nodes:  No lymphadenopathy noted Psych:  Oriented X3 and normally interactive.     Impression & Recommendations:  Problem # 1:  GASTROENTERITIS (ICD-558.9) we were going to admit pt for IVF and evaluation but hospitalist preferred pt go through ER since her BP was so low--- pt husband is taking her to [facility]---they did not want to go by ambulance Her updated medication list for this problem includes:    Zofran 8 Mg Tabs (Ondansetron hcl) .[name]... 1 by mouth q12 h as needed  Discussed use of medication and role of diet. Encouraged clear liquids and electrolyte replacement fluids. Instructed to call if any signs of worsening dehydration.   Problem # 2:  DEHYDRATION (ICD-276.51)  Complete Medication List: 1)  Foradil Aerolizer 12 Mcg Caps (Formoterol fumarate) .... One puff two times a day 2)  Spiriva Handihaler 18 Mcg Caps (Tiotropium bromide monohydrate) .... One puff once daily 3)  Ventolin Hfa 108 (90 Base) Mcg/act Aers (Albuterol sulfate) .... Two puffs up to four times per day as needed 4)  Endocet 10-325 Mg Tabs (Oxycodone-acetaminophen) .... Take one tablet every 6-8 hours as needed for pain. 5)  Fentanyl 50 Mcg/hr Pt72 (Fentanyl) .... Change patch every  three days 6)  Mobic 15 Mg Tabs (Meloxicam) .... Take 1/2-1 tablet daily 7)  Pantoprazole Sodium 40 Mg Tbec (Pantoprazole sodium) .... Take 1 tablet by mouth once a day 8)  Temazepam 15 Mg Caps (Temazepam) .... Take one to two capsules at bedtime 9)  Cyclobenzaprine Hcl 10 Mg Tabs (Cyclobenzaprine hcl) .... Take one tablet three times daily 10)  Gabapentin 600 Mg Tabs (Gabapentin) .... Take one tablet 4 times daily 11)  Zofran 8 Mg Tabs (Ondansetron hcl) .[name].. 1 by mouth q12 h as needed 12)  Resource Breeze Liqd (Nutritional supplements) .... Take 1 can (250 calories/can) two times a day per peg tube. dx: 536.3-gastroparesis, 787.01-nausea and vomiting 13)  Reglan 5 Mg Tabs (Metoclopramide hcl) .... Take 1 tablet by mouth three times a day before meals 14)  Aspirin 81 Mg Tbec (Aspirin) .[name].. 1 by mouth qd 15)  Furosemide 20 Mg Tabs (Furosemide) .[name].. 1 by mouth qd 16)  Nitrostat 0.4 Mg Subl (Nitroglycerin) .... By mouth 17)  Amiodarone Hcl 200  Mg Tabs (Amiodarone hcl) .[name].. 1 by mouth daily 18)  Carvedilol 6.25 Mg Tabs (Carvedilol) .... 2 by mouth daily 19)  Metroclopramide 5 Mg  .... By mouth three times a day   Orders Added: 1)  Est. Patient Level III [16[address]]

## 2010-04-18 NOTE — Assessment & Plan Note (Signed)
Summary: fell and broke left arm and would like a referral to orthoped...   Vital Signs:  Patient Profile:   65 Years Old Female Weight:      93.13 pounds Temp:     98.2 degrees F oral Pulse rate:   60 / minute Resp:     16 per minute BP sitting:   104 / 64  (right arm)  Vitals Entered By: Ardyth Man (February 17, 2007 11:38 AM)                 Chief Complaint:  F/u left humerus fracture.  History of Present Illness: Pleasant female here for f/u Left humerus fracture. Was admitted for Syncopal episode and currently being followed by Cardiology. Was noted to have a humeral fracture. Also noted to have osteoporotic changes which patient was aware of. States she was on Boniva in the past, but discontinued it. States it was too expensive. Was recommended Forteo based on fracture risks, but states she will not be able to afford it.  Overall doing okay without recurrence of syncope. Was placed on Cipro for an UTI. No side effects.   Current Allergies (reviewed today): ! TALWIN ! SULFA      Physical Exam  General:     alert and pleasant Neck:     No deformities, masses, or tenderness noted. Lungs:     Normal respiratory effort, chest expands symmetrically. Lungs are clear to auscultation, no crackles or wheezes. Heart:     Normal rate and regular rhythm. S1 and S2 normal without gallop, murmur, click, rub or other extra sounds. Extremities:     Left arm in a sling    Impression & Recommendations:  Problem # 1:  UTI (ICD-599.0) Continue with Cipro F/u if any problems Her updated medication list for this problem includes:    Enablex 7.5 Mg Tb24 (Darifenacin hydrobromide) .[name]... Take daily as directed   Problem # 2:  OSTEOPOROSIS (ICD-733.00) After long discussion agreed to Below. Side effects and instructions reviewed. Recommended she also day 1200mg  Calcium  Daily Will order Bone density scan Her updated medication list for this problem includes:  Fosamax Plus D 70-5600 Mg-unit Tabs (Alendronate-cholecalciferol) .[name]... 1 weekly  Orders: Radiology Referral (Radiology)   Problem # 3:  FRACTURE, HUMERUS, PROXIMAL (ICD-812.00) Referred to Ortho  Problem # 4:  SYNCOPE (ICD-780.2) No recurrence F/u by cardiology  Complete Medication List: 1)  Endocet 10-325 Mg Tabs (Oxycodone-acetaminophen) .... Take one tablet every 6-8 hours as needed for pain. 2)  Fentanyl 50 Mcg/hr Pt72 (Fentanyl) .... Change patch every three days 3)  Omeprazole 20 Mg Cpdr (Omeprazole) .... Take one tablet daily as directed 4)  Cardura 4 Mg Tabs (Doxazosin mesylate) .... Take one tablet daily 5)  Enablex 7.5 Mg Tb24 (Darifenacin hydrobromide) .... Take daily as directed 6)  Amitriptyline Hcl 25 Mg Tabs (Amitriptyline hcl) .... Take 2 tablets every evening at bedtime 7)  Celebrex 200 Mg Caps (Celecoxib) .... Take once daily 8)  Temazepam 15 Mg Caps (Temazepam) .... Take one to two capsules at bedtime 9)  Cyclobenzaprine Hcl 10 Mg Tabs (Cyclobenzaprine hcl) .... Take one tablet three times daily 10)  Clonazepam 0.5 Mg Tabs (Clonazepam) .... Take 2 tablets every evening at bedtime. 11)  Gabapentin 600 Mg Tabs (Gabapentin) .... Take one tablet daily 12)  Verelan Pm 100 Mg Cp24 (Verapamil hcl) .... Take one tablet daily 13)  Mirapex 0.125 Mg Tabs (Pramipexole dihydrochloride) .[name].. 1 by mouth 2 hrs before bed  time 14)  Norvasc 10 Mg Tabs (Amlodipine besylate) .[name].. 1 by mouth qd 15)  Promethazine Hcl 25 Mg Tabs (Promethazine hcl) .... Take one tablet 14-6 hrs as needed for nausea 16)  Fosamax Plus D 70-5600 Mg-unit Tabs (Alendronate-cholecalciferol) .Marland KitchenMarland KitchenMarland Kitchen 1 weekly     Prescriptions: FOSAMAX PLUS D 70-5600 MG-UNIT  TABS (ALENDRONATE-CHOLECALCIFEROL) 1 weekly  #1 month x 12   Entered and Authorized by:   Leanne Chang MD   Signed by:   Leanne Chang MD on 02/17/2007   Method used:   Print then Give to Patient   RxID:   [id number]  ]

## 2010-04-18 NOTE — Letter (Signed)
Summary: Results Follow-up Letter  [place] [facility]  [address] [place], [place] [address]   Phone: [phone]  Fax: [phone]    01/16/2008        [name] [address] [place], [place]  [address]  Dear [name],   The following are the results of your recent test(s):  Test     Result     Pap Smear    Normal_______  Not Normal_____       Comments: _________________________________________________________ Cholesterol LDL(Bad cholesterol):          Your goal is less than:         HDL (Good cholesterol):        Your goal is more than: _________________________________________________________ Other Tests:   _________________________________________________________  Please call for an appointment Or _Please see attached labwork.________________________________________________________ _________________________________________________________ _________________________________________________________  Sincerely,  Ardyth Man [place] at [facility]

## 2010-04-18 NOTE — Progress Notes (Signed)
Summary: prior Yetta Numbers (Approved)  Phone Note From Pharmacy   Summary of Call: prior auth [id number]................Marland KitchenFelecia Deloach CMA  February 17, 2009 12:24 PM   prior auth in process [employer] faxed awaiting response...........................................Marland KitchenFelecia Deloach CMA  February 17, 2009 12:24 PM   Follow-up for Phone Call        Celine Mans called and left message on Felicias VM stating medication is approved 02/17/09-02/16/2010. A letter will follow and the patient will be informed. Please call [phone] is any questions or concerns Follow-up by: Shonna Chock,  February 17, 2009 5:06 PM

## 2010-04-18 NOTE — Assessment & Plan Note (Signed)
Summary: 3 month recheck//ph   Vital Signs:  Patient profile:   65 year old female Weight:      86.4 pounds Temp:     98.2 degrees F oral Pulse rate:   85 / minute Pulse (ortho):   88 / minute Pulse rhythm:   regular BP supine:   110 / 70  (left arm) BP sitting:   100 / 62  (left arm) BP standing:   110 / 70  (right arm) Cuff size:   regular  Vitals Entered By: Army Fossa CMA (February 15, 2009 9:49 AM)  CC: 3 month follow up. Pt c/o throwing up every day for past 3 months. says she is eating.  Rx refill oxycodone, temazepam.    History of Present Illness: Pt here c/o N/v for over a month.  Pt has been giving herself IVF through [facility].  Phenergan is not as affective.  Pt is eating and drinking but not holding everything down.    Current Medications (verified): 1)  Spiriva Handihaler 18 Mcg Caps (Tiotropium Bromide Monohydrate) .... One Puff Once Daily 2)  Ventolin Hfa 108 (90 Base) Mcg/act Aers (Albuterol Sulfate) .... Two Puffs Up To Four Times Per Day As Needed 3)  Endocet 10-325 Mg  Tabs (Oxycodone-Acetaminophen) .... Take One Tablet Every 6-8 Hours As Needed For Pain. 4)  Fentanyl 50 Mcg/hr  Pt72 (Fentanyl) .... Change Patch Every Three Days 5)  Mobic 15 Mg Tabs (Meloxicam) .... Take 1/2 - 1 By Mouth Once Daily 6)  Prilosec 40 Mg Cpdr (Omeprazole) .... Take 1 Tablet By Mouth Once A Day 7)  Cardura 4 Mg  Tabs (Doxazosin Mesylate) .... Take One Tablet Daily 8)  Norvasc 10 Mg  Tabs (Amlodipine Besylate) .[name].. 1 By Mouth Qd 9)  Flecainide Acetate 50 Mg  Tabs (Flecainide Acetate) .[name].. 1 Tablet By Mouth Every Morning and 1/2 Tablet Po Every Night 10)  Temazepam 15 Mg  Caps (Temazepam) .... Take One To Two Capsules At Bedtime 11)  Cyclobenzaprine Hcl 10 Mg  Tabs (Cyclobenzaprine Hcl) .... Take One Tablet Three Times Daily 12)  Gabapentin 600 Mg  Tabs (Gabapentin) .... Take One Tablet 4 Times Daily 13)  Promethazine-Codeine 6.25-10 Mg/80ml Syrp (Promethazine-Codeine) .... 5 Ml  By Mouth Every 4-6 Hours As Needed,  14)  Zofran 8 Mg Tabs (Ondansetron Hcl) .[name].. 1 By Mouth Q12 H As Needed  Allergies: 1)  ! Talwin 2)  ! Sulfa  Past History:  Past medical, surgical, family and social histories (including risk factors) reviewed for relevance to current acute and chronic problems.  Past Medical History: Reviewed history from 09/16/2008 and no changes required. Anemia-iron deficiency Hypertension Osteoarthritis GERD CAD Left bundle branch block Atrial fibrillation - Dr. Roger Shelter Chronic pain Fibromyalgia Diverticulosis, colon COPD/Emphysema      -06/28/08 PFT FEV1 1.95 (97%), FVC 2.79 (101%), FEV1% 70, TLC 3.88 (87%), DLCO 64% Pneumonia      - 03/09, 12/09, 02/10, 06/10 Exudative pleural effusion s/p right thoracentesis 03/09  Past Surgical History: Reviewed history from 05/12/2008 and no changes required. Intestinal resection (5times) Hysterectomy-complete Appendectomy Bladder lift Tonsillectomy Lysis of intestinal adhesions-numerous surgeries PICC Line TAH  Family History: Reviewed history from 05/12/2008 and no changes required. Family History of Heart Disease: Father Family History of CVA: Mother No FH of Colon Cancer:  Social History: Reviewed history from 05/12/2008 and no changes required. Occupation: Disabled Alcohol Use - no Illicit Drug Use - no Patient is a former smoker.  Daily Caffeine  Use  Review of Systems      See HPI  Physical Exam  General:  appropriate dress, cooperative to examination, and underweight appearing.   Mouth:  Oral mucosa and oropharynx without lesions or exudates.  Teeth in good repair. Neck:  No deformities, masses, or tenderness noted. Lungs:  Normal respiratory effort, chest expands symmetrically. Lungs are clear to auscultation, no crackles or wheezes. Heart:  normal rate and no murmur.   Abdomen:  soft, non-tender, no distention, no masses, no guarding, and no rigidity.   Skin:  no  rashes and decreased turgor.   Psych:  Cognition and judgment appear intact. Alert and cooperative with normal attention span and concentration. No apparent delusions, illusions, hallucinations   Impression & Recommendations:  Problem # 1:  NAUSEA AND VOMITING (ICD-787.01) zofran 8 mg cont with ivf check labs f/u GI go to ER if symptoms cont or worsen  Complete Medication List: 1)  Spiriva Handihaler 18 Mcg Caps (Tiotropium bromide monohydrate) .... One puff once daily 2)  Ventolin Hfa 108 (90 Base) Mcg/act Aers (Albuterol sulfate) .... Two puffs up to four times per day as needed 3)  Endocet 10-325 Mg Tabs (Oxycodone-acetaminophen) .... Take one tablet every 6-8 hours as needed for pain. 4)  Fentanyl 50 Mcg/hr Pt72 (Fentanyl) .... Change patch every three days 5)  Mobic 15 Mg Tabs (Meloxicam) .... Take 1/2 - 1 by mouth once daily 6)  Prilosec 40 Mg Cpdr (Omeprazole) .... Take 1 tablet by mouth once a day 7)  Cardura 4 Mg Tabs (Doxazosin mesylate) .... Take one tablet daily 8)  Norvasc 10 Mg Tabs (Amlodipine besylate) .[name].. 1 by mouth qd 9)  Flecainide Acetate 50 Mg Tabs (Flecainide acetate) .[name].. 1 tablet by mouth every morning and 1/2 tablet po every night 10)  Temazepam 15 Mg Caps (Temazepam) .... Take one to two capsules at bedtime 11)  Cyclobenzaprine Hcl 10 Mg Tabs (Cyclobenzaprine hcl) .... Take one tablet three times daily 12)  Gabapentin 600 Mg Tabs (Gabapentin) .... Take one tablet 4 times daily 13)  Promethazine-codeine 6.25-10 Mg/37ml Syrp (Promethazine-codeine) .... 5 ml by mouth every 4-6 hours as needed,  14)  Zofran 8 Mg Tabs (Ondansetron hcl) .[name].. 1 by mouth q12 h as needed  Other Orders: Venipuncture (04540) TLB-B12 + Folate Pnl (98119_14782-N56/OZH) TLB-IBC Pnl (Iron/FE;Transferrin) (83550-IBC) TLB-BMP (Basic Metabolic Panel-BMET) (80048-METABOL) TLB-CBC Platelet - w/Differential (85025-CBCD) TLB-Hepatic/Liver Function Pnl (80076-HEPATIC) TLB-TSH (Thyroid  Stimulating Hormone) (84443-TSH) T-Vitamin D (25-Hydroxy) (08657-84696) Prescriptions: TEMAZEPAM 15 MG  CAPS (TEMAZEPAM) Take one to two capsules at bedtime  #60 x 2   Entered and Authorized by:   Loreen Freud DO   Signed by:   Loreen Freud DO on 02/15/2009   Method used:   Print then Give to Patient   RxID:   [id number] FENTANYL 50 MCG/HR  PT72 (FENTANYL) change patch every three days  #10 x 0   Entered and Authorized by:   Loreen Freud DO   Signed by:   Loreen Freud DO on 02/15/2009   Method used:   Print then Give to Patient   RxID:   [id number] ENDOCET 10-325 MG  TABS (OXYCODONE-ACETAMINOPHEN) Take one tablet every 6-8 hours as needed for pain.  #120 x 0   Entered and Authorized by:   Loreen Freud DO   Signed by:   Loreen Freud DO on 02/15/2009   Method used:   Print then Give to Patient   RxID:   [id number] ZOFRAN 8 MG TABS (ONDANSETRON  HCL) 1 by mouth q12 h as needed  #20 x 0   Entered and Authorized by:   Loreen Freud DO   Signed by:   Loreen Freud DO on 02/15/2009   Method used:   Electronically to        [facility] (retail)       [address]       [place], [place]  [id number]       Ph: [phone]       Fax: [phone]   RxID:   [id number]   Laboratory Results   Urine Tests    Routine Urinalysis   Glucose: negative   (Normal Range: Negative) Bilirubin: negative   (Normal Range: Negative) Ketone: negative   (Normal Range: Negative) Spec. Gravity: 1.010   (Normal Range: 1.003-1.035) Blood: negative   (Normal Range: Negative) pH: 6.0   (Normal Range: 5.0-8.0) Protein: negative   (Normal Range: Negative) Urobilinogen: 0.2   (Normal Range: 0-1) Nitrite: negative   (Normal Range: Negative) Leukocyte Esterace: negative   (Normal Range: Negative)

## 2010-04-18 NOTE — Assessment & Plan Note (Signed)
Summary: MONTHLY B12 SHOT...LSW.  Nurse Visit   Allergies: 1)  ! Talwin 2)  ! Sulfa  Medication Administration  Injection # 1:    Medication: Vit B12 1000 mcg    Diagnosis: B12 DEFICIENCY (ICD-266.2)    Route: IM    Site: L deltoid    Exp Date: 06/2011    Lot #: [id number]    Mfr: [employer]    Comments: pt to schedule next monthly b12 at the front desk    Patient tolerated injection without complications    Given by: Chales Abrahams CMA (AAMA) (November 17, 2009 11:14 AM)  Orders Added: 1)  Vit B12 1000 mcg [J3420]

## 2010-04-18 NOTE — Letter (Signed)
Summary: Patient [facility] Biopsy Results  [facility]  [address] [place], [place] [address]   Phone: [phone]  Fax: [phone]        June 05, 2008 MRN: [id number]    [facility] [address] [place], [place]  [address]    Dear Ms. Tomey,  I am pleased to inform you that the biopsies taken during your recent endoscopic examination did not show any evidence of cancer upon pathologic examination.The biopsies taken from Your stomach, small bowl and colon show normal tissue, no evidence of colitis  Additional information/recommendations:  __No further action is needed at this time.  Please follow-up with      your primary care physician for your other healthcare needs.  __ Please call [phone] to schedule a return visit to review      your condition.  x__ Continue with the treatment plan as outlined on the day of your      exam.    Please call us if you are having persistent problems or have questions about your condition that have not been fully answered at this time.  Sincerely,  Hart Carwin  This letter has been electronically signed by your physician.

## 2010-04-18 NOTE — Miscellaneous (Signed)
Summary: Pulmonary function test   Pulmonary Function Test Date: 06/28/2008 Height (in.): 62 Gender: Female  Pre-Spirometry FVC    Value: 2.79 L/min   Pred: 2.77 L/min     % Pred: 101 % FEV1    Value: 1.95 L     Pred: 2.01 L     % Pred: 97 % FEV1/FVC  Value: 70 %     Pred: 73 %     % Pred: . % FEF 25-75  Value: 0.99 L/min   Pred: 2.38 L/min     % Pred: 42 %  Post-Spirometry FVC    Value: 2.71 L/min   Pred: 2.77 L/min     % Pred: 98 % FEV1    Value: 1.90 L     Pred: 2.01 L     % Pred: 94 % FEV1/FVC  Value: 70 %     Pred: 73 %     % Pred: . % FEF 25-75  Value: 1.11 L/min   Pred: 2.38 L/min     % Pred: 46 %  Lung Volumes TLC    Value: 3.88 L   % Pred: 87 % RV    Value: 1.10 L   % Pred: 64 % DLCO    Value: 9.9 %   % Pred: 68 % DLCO/VA  Value: 3.38 %   % Pred: 91 %  Comments: Mild obstruction.  No bronchodilator response.  Mild decrease in RV, otherwise normal lung volumes.  Mild diffusing defect.  Clinical Lists Changes  Observations: Added new observation of PFT COMMENTS: Mild obstruction.  No bronchodilator response.  Mild decrease in RV, otherwise normal lung volumes.  Mild diffusing defect. (06/28/2008 12:39) Added new observation of DLCO/VA%EXP: 91 % (06/28/2008 12:39) Added new observation of DLCO/VA: 3.38 % (06/28/2008 12:39) Added new observation of DLCO % EXPEC: 68 % (06/28/2008 12:39) Added new observation of DLCO: 9.9 % (06/28/2008 12:39) Added new observation of RV % EXPECT: 64 % (06/28/2008 12:39) Added new observation of RV: 1.10 L (06/28/2008 12:39) Added new observation of TLC % EXPECT: 87 % (06/28/2008 12:39) Added new observation of TLC: 3.88 L (06/28/2008 12:39) Added new observation of FEF2575%EXPS: 46 % (06/28/2008 12:39) Added new observation of PSTFEF25/75P: 2.38  (06/28/2008 12:39) Added new observation of PSTFEF25/75%: 1.11 L/min (06/28/2008 12:39) Added new observation of PSTFEV1/FCV%: . % (06/28/2008 12:39) Added new observation of FEV1FVCPRDPS: 73  % (06/28/2008 12:39) Added new observation of PSTFEV1/FVC: 70 % (06/28/2008 12:39) Added new observation of POSTFEV1%PRD: 94 % (06/28/2008 12:39) Added new observation of FEV1PRDPST: 2.01 L (06/28/2008 12:39) Added new observation of POST FEV1: 1.90 L/min (06/28/2008 12:39) Added new observation of POST FVC%EXP: 98 % (06/28/2008 12:39) Added new observation of FVCPRDPST: 2.77 L/min (06/28/2008 12:39) Added new observation of POST FVC: 2.71 L (06/28/2008 12:39) Added new observation of FEF % EXPEC: 42 % (06/28/2008 12:39) Added new observation of FEF25-75%PRE: 2.38 L/min (06/28/2008 12:39) Added new observation of FEF 25-75%: 0.99 L/min (06/28/2008 12:39) Added new observation of FEV1/FVC%EXP: . % (06/28/2008 12:39) Added new observation of FEV1/FVC PRE: 73 % (06/28/2008 12:39) Added new observation of FEV1/FVC: 70 % (06/28/2008 12:39) Added new observation of FEV1 % EXP: 97 % (06/28/2008 12:39) Added new observation of FEV1 PREDICT: 2.01 L (06/28/2008 12:39) Added new observation of FEV1: 1.95 L (06/28/2008 12:39) Added new observation of FVC % EXPECT: 101 % (06/28/2008 12:39) Added new observation of FVC PREDICT: 2.77 L (06/28/2008 12:39) Added new observation of FVC: 2.79 L (06/28/2008 12:39) Added new observation  of PFT HEIGHT: 62  (06/28/2008 12:39) Added new observation of PFT DATE: 06/28/2008  (06/28/2008 12:39)

## 2010-04-18 NOTE — Progress Notes (Signed)
Summary: rx refill - dr Blossom Hoops  Phone Note Refill Request   Refills Requested: Medication #1:  FENTANYL 50 MCG/HR  PT72 change patch every three days   Last Refilled: [id number]  Medication #2:  ENDOCET 10-325 MG  TABS Take one tablet every 6-8 hours as needed for pain.   Last Refilled: 102208 call patient when ready  Initial call taken by: Okey Regal [place],  February 10, 2007 9:49 AM  Follow-up for Phone Call        Pt. aware to p/u rx up front at her convenience ...................................................................Ardyth Man  February 10, 2007 10:16 AM  Follow-up by: Ardyth Man,  February 10, 2007 10:16 AM      Prescriptions: FENTANYL 50 MCG/HR  PT72 (FENTANYL) change patch every three days  #10 x 0   Entered by:   Ardyth Man   Authorized by:   Leanne Chang MD   Signed by:   Ardyth Man on 02/10/2007   Method used:   Print then Give to Patient   RxID:   [id number] ENDOCET 10-325 MG  TABS (OXYCODONE-ACETAMINOPHEN) Take one tablet every 6-8 hours as needed for pain.  #120 x 0   Entered by:   Ardyth Man   Authorized by:   Leanne Chang MD   Signed by:   Ardyth Man on 02/10/2007   Method used:   Print then Give to Patient   RxID:   [id number]

## 2010-04-18 NOTE — Progress Notes (Signed)
Summary: BONE DENSITY

## 2010-04-18 NOTE — Letter (Signed)
Summary: CMN for infusion/[facility]  Infusion order/[facility]   Imported By: Sherian Rein 01/04/2010 09:59:01  _____________________________________________________________________  External Attachment:    Type:   Image     Comment:   External Document

## 2010-04-18 NOTE — Progress Notes (Signed)
Summary: REFILL PROMETHAZINE//[facility]  Phone Note Refill Request Message from:  Pharmacy on [facility] [phone]  Refills Requested: Medication #1:  PROMETHAZINE HCL 25 MG  TABS Take one tablet  q 4-6 hrs as needed for nausea Initial call taken by: Barb Merino,  November 24, 2008 11:51 AM  Follow-up for Phone Call        LAST OV 11/16/08, LAST REFILL #30 X 3 ON 12/08/07 Follow-up by: Kandice Hams,  November 26, 2008 9:21 AM  Additional Follow-up for Phone Call Additional follow up Details #1::        ok to refill x1 Additional Follow-up by: Loreen Freud DO,  November 26, 2008 9:27 AM    Prescriptions: PROMETHAZINE HCL 25 MG  TABS (PROMETHAZINE HCL) Take one tablet  q 4-6 hrs as needed for nausea  #30 x 0   Entered by:   Kandice Hams   Authorized by:   Loreen Freud DO   Signed by:   Kandice Hams on 11/26/2008   Method used:   Faxed to ...       [facility] (retail)       [address]       [place], [place]  [id number]       Ph: [phone]       Fax: [phone]   RxID:   [id number]

## 2010-04-18 NOTE — Progress Notes (Signed)
Summary: [facility]-REFILL[facility] ENDOCET 10/325MG  AND FENTANYL   Phone Note Refill Request Call back at Home Phone [phone] Message from:  Patient  Refills Requested: Medication #1:  ENDOCET 10-325 MG  TABS Take one tablet every 6-8 hours as needed for pain.  Medication #2:  FENTANYL 50 MCG/HR  PT72 change patch every three days please call pt when ready for pickup  Initial call taken by: Gwen Pounds,  July 14, 2007 11:44 AM  Follow-up for Phone Call        Dr. Drue Novel ok to fill? They were last filled 06/12/07 for # 10 patches & #120 endocet ..................................................................Marland KitchenShary Decamp  July 14, 2007 11:55 AM ok both 1 month, no RF....................................................................Jose E. Paz MD  July 14, 2007 12:04 PM  patient aware ready for pick up ..................................................................Marland KitchenShary Decamp  July 14, 2007 2:06 PM       Prescriptions: FENTANYL 50 MCG/HR  PT72 (FENTANYL) change patch every three days  #10 x 0   Entered by:   Shary Decamp   Authorized by:   Nolon Rod. Paz MD   Signed by:   Shary Decamp on 07/14/2007   Method used:   Print then Give to Patient   RxID:   [id number] ENDOCET 10-325 MG  TABS (OXYCODONE-ACETAMINOPHEN) Take one tablet every 6-8 hours as needed for pain.  #120 x 0   Entered by:   Shary Decamp   Authorized by:   Nolon Rod. Paz MD   Signed by:   Shary Decamp on 07/14/2007   Method used:   Print then Give to Patient   RxID:   [id number]

## 2010-04-18 NOTE — Assessment & Plan Note (Signed)
Summary: nep/poss placment needed/mt      Allergies Added:   Referring Provider:  Roger Shelter Primary Provider:  Loreen Freud, DO   History of Present Illness: [name] is seen at the request of Dr. Deborah Chalk for consideration of CRT implantation.  She is a 65 year old woman with a complex past medical history including chronic GI fluid losses prompting PICC lines for fluid resuscitation at home, COPD, urinary problems requiring implantation of a [employer] bladder stimulator   Her cardiac history includes long-standing left bundle branch block with which she presented with syncope a number of years ago. Outpatient monitoring demonstrated atrial arrhythmias for which she was started on amiodarone. This is described thoroughly as flutter and fibrillation.she was treated with like a knife.For reasons that are not clear she is not on anticoagulation.  She was in 2008 at the above presentation demonstrating mild left ventricular dysfunction with EF of 50%. A nuclear scan in 2009 demonstrated an ejection fraction of 34%. Beta blocker and ACE inhibitor therapy was not initiated at that time.  last month she became septic related to a urinary tract infection. She was very very sick. Her PICC line ended up having to be changed. Her ejection fraction was noted to be pressed. She underwent catheterization yesterday demonstrating an ejection fraction of 30-35% with nonobstructive coronary disease.  Current Medications (verified): 1)  Foradil Aerolizer 12 Mcg Caps (Formoterol Fumarate) .... One Puff Two Times A Day 2)  Spiriva Handihaler 18 Mcg Caps (Tiotropium Bromide Monohydrate) .... One Puff Once Daily 3)  Ventolin Hfa 108 (90 Base) Mcg/act Aers (Albuterol Sulfate) .... Two Puffs Up To Four Times Per Day As Needed 4)  Endocet 10-325 Mg  Tabs (Oxycodone-Acetaminophen) .... Take One Tablet Every 6-8 Hours As Needed For Pain. 5)  Fentanyl 50 Mcg/hr  Pt72 (Fentanyl) .... Change Patch Every Three  Days 6)  Mobic 15 Mg Tabs (Meloxicam) .... Hold 7)  Pantoprazole Sodium 40 Mg Tbec (Pantoprazole Sodium) .... Take 1 Tablet By Mouth Once A Day 8)  Temazepam 15 Mg  Caps (Temazepam) .... Take One To Two Capsules At Bedtime 9)  Cyclobenzaprine Hcl 10 Mg  Tabs (Cyclobenzaprine Hcl) .... Take One Tablet Three Times Daily 10)  Gabapentin 600 Mg  Tabs (Gabapentin) .... Take One Tablet 4 Times Daily 11)  Zofran 8 Mg Tabs (Ondansetron Hcl) .[name].. 1 By Mouth Q12 H As Needed 12)  Resource Breeze  Liqd (Nutritional Supplements) .... Take 1 Can (250 Calories/can) Two Times A Day Per Peg Tube. Dx: 536.3-Gastroparesis, 787.01-Nausea and Vomiting 13)  Reglan 5 Mg Tabs (Metoclopramide Hcl) .... Take 1 Tablet By Mouth Three Times A Day Before Meals 14)  Aspirin 81 Mg Tbec (Aspirin) .[name].. 1 By Mouth Qd 15)  Furosemide 20 Mg Tabs (Furosemide) .[name].. 1 By Mouth Qd 16)  Nitrostat 0.4 Mg Subl (Nitroglycerin) .... By Mouth 17)  Amiodarone Hcl 200 Mg Tabs (Amiodarone Hcl) .[name].. 1 By Mouth Daily 18)  Carvedilol 6.25 Mg Tabs (Carvedilol) .... 2 By Mouth Daily 19)  Metroclopramide 5 Mg .... By Mouth Three Times A Day  Allergies (verified): 1)  ! Talwin 2)  ! Sulfa  Past History:  Past Medical History: Last updated: 11/15/2009 Anemia-iron deficiency CHRONIC PARTIAL SMALL BOWEL OBSTRUCTION Hypertension Osteoarthritis GERD CAD Left bundle branch block Atrial fibrillation - Dr. Roger Shelter Chronic pain Fibromyalgia Diverticulosis, colon COPD/Emphysema      -06/28/08 PFT FEV1 1.95 (97%), FVC 2.79 (101%), FEV1% 70, TLC 3.88 (87%), DLCO 64% Pneumonia      -  03/09, 12/09, 02/10, 06/10 Exudative pleural effusion s/p right thoracentesis 03/09 RLL pneumonia 10/26/2009,  sepsis picc line, UTI,   hospitalization [place]  Past Surgical History: Last updated: 09/07/2009 Intestinal resection (5times),S/P PARTIAL COLECTOMY 2001 Hysterectomy-complete Appendectomy Bladder lift Tonsillectomy Lysis of intestinal  adhesions-numerous surgeries/LAST 2003 PICC Line TAH Intestinal surgery again 04/27/2009 06/2009--changed colostomy tube---Dr Dwain Sarna  Family History: Last updated: 05/12/2008 Family History of Heart Disease: Father Family History of CVA: Mother No FH of Colon Cancer:  Social History: Last updated: 04/07/2009 Occupation: Disabled Alcohol Use - no Illicit Drug Use - no Patient is a former smoker, quit 1988 x63yrs, <1ppd.  Daily Caffeine Use  Vital Signs:  Patient profile:   65 year old female Height:      62 inches Weight:      93.50 pounds Pulse rate:   68 / minute Pulse rhythm:   regular BP sitting:   118 / 64  (right arm) Cuff size:   regular  Vitals Entered By: Judithe Modest CMA (November 17, 2009 9:05 AM)  Physical Exam  General:  cachectic and somewhat ill-appearing older Caucasian female appearing her stated age of 56 Head:  HEENT normal apart from a scar underneath her left shin from a recently resected skin cancer Neck:  JVP 8-9 cm with brisk carotids; supple Chest Wall:  without CVA tenderness or scoliosis Lungs:  clear to auscultation Heart:  regular rate and rhythm with a displaced PMI in a properly is to Abdomen:  nontender with a J-tube/G-tube in place Msk:  very thin PICC line in the left upper extremity no obvious deformations Pulses:  trace pulses Extremities:  clubbing cyanosis or edema Neurologic:  alert and oriented x3 Skin:  warm and dry without rashes Cervical Nodes:  no adenopathy Psych:  engaging affect   Impression & Recommendations:  Problem # 1:  CARDIOMYOPATHY, PRIMARY, DILATED (ICD-425.4) The patient has a newly identified/treated nonischemic cardiomyopathy. Her ejection fraction was mildly depressed 2 years ago, no that was administered at that time as best as I can tell. Beta blocker therapy has recently been started. ACE Inhibitor and Aldactone therapy had to be discontinued because of hyperkalemia the mechanism of which is not yet  clear. The identification of a cardiomyopathy also comes in a context of a sepsis syndrome related to indwelling PICC line. This may further have dampened left ventricular systolic function.  Guidelines have suggested that 3-6 months of maximal medical therapy is appropriate prior to implantation of an ICD in a nonischemic patient. It should be noted that she also has left bundle branch block and as a woman with the expected to potentially be a "super responder" with cardiac resynchronization in the event that her left ventricular function persists.  Implantation of a device in her is further complicated however by the potential risks associated with that implantation related to the lack of venous access and the presence of persistent transcutaneous catheters for volume resuscitation. These risks would have to be balanced with potential benefit even in the event that device therapy were pursued if the LV function persists. It would also be appropriate to consider hydralazine nitrate therapy in the event that she is unable to tolerate ACE inhibitor therapy.  The patient is very anxious about her risk of cardiac death. She and her husband have adopted many children she has 3 teenagers still at home. Her updated medication list for this problem includes:    Aspirin 81 Mg Tbec (Aspirin) .[name]... 1 by mouth qd  Furosemide 20 Mg Tabs (Furosemide) .[name]... 1 by mouth qd    Nitrostat 0.4 Mg Subl (Nitroglycerin) ..... By mouth    Amiodarone Hcl 200 Mg Tabs (Amiodarone hcl) .[name]... 1 by mouth daily    Carvedilol 6.25 Mg Tabs (Carvedilol) .[name]... 2 by mouth daily  Problem # 2:  SYSTOLIC HEART FAILURE, CHRONIC (ICD-428.22) as above. She has class III symptoms currently. SHe is improving in the wake of her sepsis syndrome. Her updated medication list for this problem includes:    Aspirin 81 Mg Tbec (Aspirin) .[name]... 1 by mouth qd    Furosemide 20 Mg Tabs (Furosemide) .[name]... 1 by mouth qd    Nitrostat 0.4 Mg Subl  (Nitroglycerin) ..... By mouth    Amiodarone Hcl 200 Mg Tabs (Amiodarone hcl) .[name]... 1 by mouth daily    Carvedilol 6.25 Mg Tabs (Carvedilol) .[name]... 2 by mouth daily  Problem # 3:  LBBB (ICD-426.3) as above Her updated medication list for this problem includes:    Aspirin 81 Mg Tbec (Aspirin) .[name]... 1 by mouth qd    Nitrostat 0.4 Mg Subl (Nitroglycerin) ..... By mouth    Amiodarone Hcl 200 Mg Tabs (Amiodarone hcl) .[name]... 1 by mouth daily    Carvedilol 6.25 Mg Tabs (Carvedilol) .[name]... 2 by mouth daily  Orders: EKG w/ Interpretation (93000)  Problem # 4:  SYNCOPE (ICD-780.2) the patient had syncope in the setting of left bundle branch block and a Van none significant cardiomyopathy. It was attributed to atrial flutter in the wake of a Holter monitor apparently. She was started on flecainide therapy. When she presented with her sepsis syndrome and depressed left ventricular function this was changed to amiodarone. In the event that the rhythm problem was primarily flutter, and not fibrillation, it is reasonable to think about catheter ablation as an alternative antiarrhythmic drug therapy.  Furthermore, in the event that she has atrial arrhythmias, or thromboembolic risk factors are high with a CHADS VASC score 4(one age, one-gender, one-hypertension, one-congestive heart failure) as such she should be on oral anticoagulant therapy either with Pradaxa or with Coumadin the absence of a contraindication. Her updated medication list for this problem includes:    Aspirin 81 Mg Tbec (Aspirin) .[name]... 1 by mouth qd    Nitrostat 0.4 Mg Subl (Nitroglycerin) ..... By mouth    Amiodarone Hcl 200 Mg Tabs (Amiodarone hcl) .[name]... 1 by mouth daily    Carvedilol 6.25 Mg Tabs (Carvedilol) .[name]... 2 by mouth daily  Problem # 5:  DYSAUTONOMIA ???? TO EXPLAIN GI ISSUES/BLADDER ISSUES (ICD-742.8) her unrelated medical history is extremely complicated. The issues  though of GI dysmotility and bladder dysmotility to my  ignorant appraisal raises the possibility of a systemic dysautonomia. I wonder if this has been investigated; the term was not familiar to the patient  Patient Instructions: 1)  Your physician recommends that you schedule a follow-up appointment in: AS NEEDED 2)  Your physician recommends that you continue on your current medications as directed. Please refer to the Current Medication list given to you today.  Appended Document: [facility]      Allergies: 1)  ! Talwin 2)  ! Sulfa   EKG  Procedure date:  11/18/2009  Findings:      nsr lbbb

## 2010-04-18 NOTE — Assessment & Plan Note (Signed)
Summary: Acute NP office visit - cough   Copy to:  Loreen Freud, MD Primary Provider/Referring Provider:  Loreen Freud, DO  CC:  prod cough, pain in lower right rib area, and increased SOB x2.5weeks - denies f/c/s.  History of Present Illness: 65  yo female with history of COPD and recurrent pneumonia.  April 07, 2009 --Presents for an acute office visit. Complains of prod cough, pain in lower right rib area, increased SOB x2.5weeks. OTC not working. Denies chest pain, dyspnea, orthopnea, hemoptysis, fever,  edema, headache,recent travel or antibiotics.  Is scheduled for colon surgery for adhesion and recurrent bowel obstructions. Has lost weight due to colon isssues. Has intermittent n/v.     Medications Prior to Update: 1)  Spiriva Handihaler 18 Mcg Caps (Tiotropium Bromide Monohydrate) .... One Puff Once Daily 2)  Ventolin Hfa 108 (90 Base) Mcg/act Aers (Albuterol Sulfate) .... Two Puffs Up To Four Times Per Day As Needed 3)  Endocet 10-325 Mg  Tabs (Oxycodone-Acetaminophen) .... Take One Tablet Every 6-8 Hours As Needed For Pain. 4)  Fentanyl 50 Mcg/hr  Pt72 (Fentanyl) .... Change Patch Every Three Days 5)  Mobic 15 Mg Tabs (Meloxicam) .... Take 1/2 - 1 By Mouth Once Daily 6)  Prilosec 40 Mg Cpdr (Omeprazole) .... Take 1 Tablet By Mouth Once A Day 7)  Norvasc 10 Mg  Tabs (Amlodipine Besylate) .[name].. 1 By Mouth Qd 8)  Flecainide Acetate 50 Mg  Tabs (Flecainide Acetate) .[name].. 1 Tablet By Mouth Every Morning and 1/2 Tablet Po Every Night 9)  Temazepam 15 Mg  Caps (Temazepam) .... Take One To Two Capsules At Bedtime 10)  Cyclobenzaprine Hcl 10 Mg  Tabs (Cyclobenzaprine Hcl) .... Take One Tablet Three Times Daily 11)  Gabapentin 600 Mg  Tabs (Gabapentin) .... Take One Tablet 4 Times Daily 12)  Promethazine-Codeine 6.25-10 Mg/50ml Syrp (Promethazine-Codeine) .... 5 Ml By Mouth Every 4-6 Hours As Needed,  13)  Zofran 8 Mg Tabs (Ondansetron Hcl) .[name].. 1 By Mouth Q12 H As Needed 14)   Foradil Aerolizer 12 Mcg Caps (Formoterol Fumarate) .... One Puff Two Times A Day  Current Medications (verified): 1)  Spiriva Handihaler 18 Mcg Caps (Tiotropium Bromide Monohydrate) .... One Puff Once Daily 2)  Ventolin Hfa 108 (90 Base) Mcg/act Aers (Albuterol Sulfate) .... Two Puffs Up To Four Times Per Day As Needed 3)  Endocet 10-325 Mg  Tabs (Oxycodone-Acetaminophen) .... Take One Tablet Every 6-8 Hours As Needed For Pain. 4)  Fentanyl 50 Mcg/hr  Pt72 (Fentanyl) .... Change Patch Every Three Days 5)  Mobic 15 Mg Tabs (Meloxicam) .... Take 1/2 - 1 By Mouth Once Daily 6)  Prilosec 40 Mg Cpdr (Omeprazole) .... Take 1 Tablet By Mouth Once A Day 7)  Norvasc 10 Mg  Tabs (Amlodipine Besylate) .[name].. 1 By Mouth Qd 8)  Flecainide Acetate 50 Mg  Tabs (Flecainide Acetate) .[name].. 1 Tablet By Mouth Every Morning and 1/2 Tablet Po Every Night 9)  Temazepam 15 Mg  Caps (Temazepam) .... Take One To Two Capsules At Bedtime 10)  Cyclobenzaprine Hcl 10 Mg  Tabs (Cyclobenzaprine Hcl) .... Take One Tablet Three Times Daily 11)  Gabapentin 600 Mg  Tabs (Gabapentin) .... Take One Tablet 4 Times Daily 12)  Promethazine-Codeine 6.25-10 Mg/76ml Syrp (Promethazine-Codeine) .... 5 Ml By Mouth Every 4-6 Hours As Needed,  13)  Zofran 8 Mg Tabs (Ondansetron Hcl) .[name].. 1 By Mouth Q12 H As Needed 14)  Foradil Aerolizer 12 Mcg Caps (Formoterol Fumarate) .Marland KitchenMarland KitchenMarland Kitchen  One Puff Two Times A Day  Allergies (verified): 1)  ! Talwin 2)  ! Sulfa  Past History:  Past Medical History: Last updated: 02/17/2009 Anemia-iron deficiency CHRONIC PARTIAL SMALL BOWEL OBSTRUCTION Hypertension Osteoarthritis GERD CAD Left bundle branch block Atrial fibrillation - Dr. Roger Shelter Chronic pain Fibromyalgia Diverticulosis, colon COPD/Emphysema      -06/28/08 PFT FEV1 1.95 (97%), FVC 2.79 (101%), FEV1% 70, TLC 3.88 (87%), DLCO 64% Pneumonia      - 03/09, 12/09, 02/10, 06/10 Exudative pleural effusion s/p right thoracentesis  03/09  Past Surgical History: Last updated: 02/17/2009 Intestinal resection (5times),S/P PARTIAL COLECTOMY 2001 Hysterectomy-complete Appendectomy Bladder lift Tonsillectomy Lysis of intestinal adhesions-numerous surgeries/LAST 2003 PICC Line TAH  Family History: Last updated: 05/12/2008 Family History of Heart Disease: Father Family History of CVA: Mother No FH of Colon Cancer:  Social History: Last updated: 04/07/2009 Occupation: Disabled Alcohol Use - no Illicit Drug Use - no Patient is a former smoker, quit 1988 x29yrs, <1ppd.  Daily Caffeine Use  Risk Factors: Caffeine Use: 1 (10/09/2007)  Risk Factors: Smoking Status: quit (10/09/2007)  Social History: Occupation: Disabled Alcohol Use - no Illicit Drug Use - no Patient is a former smoker, quit 1988 x58yrs, <1ppd.  Daily Caffeine Use  Review of Systems      See HPI  Vital Signs:  Patient profile:   65 year old female Height:      62 inches Weight:      85 pounds BMI:     15.60 O2 Sat:      98 % on Room air Temp:     98.1 degrees F oral Pulse rate:   105 / minute BP sitting:   106 / 72  (right arm) Cuff size:   regular  Vitals Entered By: Boone Master CNA (April 07, 2009 10:55 AM)  O2 Flow:  Room air CC: prod cough, pain in lower right rib area, increased SOB x2.5weeks - denies f/c/s Is Patient Diabetic? No Comments Medications reviewed with patient Daytime contact number verified with patient. Boone Master CNA  April 07, 2009 10:55 AM    Physical Exam  Additional Exam:  General: frail female  HEENT: [place]/AT.  Mucus membranes pink/moist.  No JVD. Neuro: Awake, alert, oriented.  MAE, speech clear.  EOMI, PERRLA CV: S1S2 RRR, no murmurs/rubs/gallops.  Radial pulses +2.   Pulm: Coarse BS with scattered rhonchi GI: abdomen round/soft, non-tender to palpation.  BSx4 active. No organomegaly. Skin: no rashes or lesions Ext: warm/dry.  No edema      Impression & Recommendations:  Problem  # 1:  COPD (ICD-496) Exacerbation  REC:  Levaquin 500mg  once daily for 7 days Mucinex DM two times a day as needed cough/congestion lots of fluids.  Please contact office for sooner follow up if symptoms do not improve or worsen  follow up Dr. Craige Cotta as scheduled.  PT with chronic partial SBO due for surgery soon, advised to call back if unable to keep food/meds down.   Medications Added to Medication List This Visit: 1)  Levaquin 500 Mg Tabs (Levofloxacin) .[name].. 1 by mouth once daily  Complete Medication List: 1)  Spiriva Handihaler 18 Mcg Caps (Tiotropium bromide monohydrate) .... One puff once daily 2)  Ventolin Hfa 108 (90 Base) Mcg/act Aers (Albuterol sulfate) .... Two puffs up to four times per day as needed 3)  Endocet 10-325 Mg Tabs (Oxycodone-acetaminophen) .... Take one tablet every 6-8 hours as needed for pain. 4)  Fentanyl 50 Mcg/hr Pt72 (Fentanyl) .... Change  patch every three days 5)  Mobic 15 Mg Tabs (Meloxicam) .... Take 1/2 - 1 by mouth once daily 6)  Prilosec 40 Mg Cpdr (Omeprazole) .... Take 1 tablet by mouth once a day 7)  Norvasc 10 Mg Tabs (Amlodipine besylate) .[name].. 1 by mouth qd 8)  Flecainide Acetate 50 Mg Tabs (Flecainide acetate) .[name].. 1 tablet by mouth every morning and 1/2 tablet po every night 9)  Temazepam 15 Mg Caps (Temazepam) .... Take one to two capsules at bedtime 10)  Cyclobenzaprine Hcl 10 Mg Tabs (Cyclobenzaprine hcl) .... Take one tablet three times daily 11)  Gabapentin 600 Mg Tabs (Gabapentin) .... Take one tablet 4 times daily 12)  Promethazine-codeine 6.25-10 Mg/104ml Syrp (Promethazine-codeine) .... 5 ml by mouth every 4-6 hours as needed,  13)  Zofran 8 Mg Tabs (Ondansetron hcl) .[name].. 1 by mouth q12 h as needed 14)  Foradil Aerolizer 12 Mcg Caps (Formoterol fumarate) .... One puff two times a day 15)  Levaquin 500 Mg Tabs (Levofloxacin) .[name].. 1 by mouth once daily  Other Orders: Nebulizer Tx (29528) Est. Patient Level IV (41324)  Patient  Instructions: 1)  Levaquin 500mg  once daily for 7 days 2)  Mucinex DM two times a day as needed cough/congestion 3)  lots of fluids.  4)  Please contact office for sooner follow up if symptoms do not improve or worsen  5)  follow up Dr. Craige Cotta as scheduled.  Prescriptions: LEVAQUIN 500 MG TABS (LEVOFLOXACIN) 1 by mouth once daily  #7 x 0   Entered and Authorized by:   Rubye Oaks NP   Signed by:   Tammy Parrett NP on 04/07/2009   Method used:   Electronically to        [facility]* (retail)       [address]       [place], [place]  [id number]       Ph: [phone]       Fax: [phone]   RxID:   [id number]

## 2010-04-18 NOTE — Assessment & Plan Note (Signed)
Summary: ACUTE FOR PNUEAMENIA //PH   Vital Signs:  Patient Profile:   65 Years Old Female Height:     62 inches Weight:      92 pounds O2 Sat:      94 % Temp:     98.1 degrees F oral Pulse rate:   121 / minute Resp:     18 per minute BP sitting:   98 / 70  (right arm)  Pt. in pain?   no  Vitals Entered By: Ardyth Man (March 02, 2008 10:52 AM)                  PCP:  Loreen Freud, M.D.  Chief Complaint:  acute/pneumonia-since last Thursday and URI symptoms.  History of Present Illness: Pt here c/o cough and congestion similar to how she started before her pneumonia.  No fever.    URI Symptoms      This is a 65 year old woman who presents with URI symptoms.  The symptoms began duration > 3 days ago.  The patient reports purulent nasal discharge and productive cough, but denies nasal congestion, clear nasal discharge, sore throat, dry cough, earache, and sick contacts.  The patient denies fever, low-grade fever (<100.5 degrees), fever of 100.5-103 degrees, fever of 103.1-104 degrees, fever to >104 degrees, stiff neck, dyspnea, wheezing, rash, vomiting, diarrhea, use of an antipyretic, and response to antipyretic.  The patient denies itchy watery eyes, itchy throat, sneezing, seasonal symptoms, response to antihistamine, headache, muscle aches, and severe fatigue.  The patient denies the following risk factors for Strep sinusitis: unilateral facial pain, unilateral nasal discharge, poor response to decongestant, double sickening, tooth pain, Strep exposure, tender adenopathy, and absence of cough.      Current Allergies: ! TALWIN ! SULFA  Past Medical History:    Reviewed history from 05/30/2007 and no changes required:       Anemia-iron deficiency       Hypertension       Osteoarthritis       GERD   Family History:    Reviewed history from 10/09/2007 and no changes required:       Family History of Heart Disease: Father       Family History of CVA: Mother  No FH of Colon Cancer:  Social History:    Reviewed history from 10/09/2007 and no changes required:       Occupation: Disabled       Alcohol Use - no       Illicit Drug Use - no       Patient is a former smoker.        Daily Caffeine Use    Review of Systems      See HPI   Physical Exam  General:     Well-developed,well-nourished,in no acute distress; alert,appropriate and cooperative throughout examination Ears:     External ear exam shows no significant lesions or deformities.  Otoscopic examination reveals clear canals, tympanic membranes are intact bilaterally without bulging, retraction, inflammation or discharge. Hearing is grossly normal bilaterally. Nose:     External nasal examination shows no deformity or inflammation. Nasal mucosa are pink and moist without lesions or exudates. Mouth:     Oral mucosa and oropharynx without lesions or exudates.  Teeth in good repair. Neck:     No deformities, masses, or tenderness noted. Lungs:     normal respiratory effort, no intercostal retractions, and L decreased breath sounds.   Heart:  normal rate and regular rhythm.   Psych:     Cognition and judgment appear intact. Alert and cooperative with normal attention span and concentration. No apparent delusions, illusions, hallucinations    Impression & Recommendations:  Problem # 1:  PNEUMONIA, LEFT LOWER LOBE (ICD-481)  Her updated medication list for this problem includes:    Avelox 400 Mg Tabs (Moxifloxacin hcl) .[name]... 1 by mouth once daily  Orders: Venipuncture ([address]109) TLB-BMP (Basic Metabolic Panel-BMET) (80048-METABOL) TLB-CBC Platelet - w/Differential (85025-CBCD) T-2 View CXR (71020TC) Instructed patient to complete antibiotics, and call for worsened shortness of breath or new symptoms.   Complete Medication List: 1)  Endocet 10-325 Mg Tabs (Oxycodone-acetaminophen) .... Take one tablet every 6-8 hours as needed for pain. 2)  Fentanyl 50 Mcg/hr Pt72  (Fentanyl) .... Change patch every three days 3)  Omeprazole 20 Mg Cpdr (Omeprazole) .... Take one tablet daily as directed 4)  Cardura 4 Mg Tabs (Doxazosin mesylate) .... Take one tablet daily 5)  Enablex 7.5 Mg Tb24 (Darifenacin hydrobromide) .... Take daily as directed 6)  Amitriptyline Hcl 25 Mg Tabs (Amitriptyline hcl) .... Take 2 tablets every evening at bedtime 7)  Celebrex 200 Mg Caps (Celecoxib) .... Take one tablet twice daily 8)  Temazepam 15 Mg Caps (Temazepam) .... Take one to two capsules at bedtime 9)  Cyclobenzaprine Hcl 10 Mg Tabs (Cyclobenzaprine hcl) .... Take one tablet three times daily 10)  Gabapentin 600 Mg Tabs (Gabapentin) .... Take one tablet 4 times daily 11)  Norvasc 10 Mg Tabs (Amlodipine besylate) .[name].. 1 by mouth qd 12)  Promethazine Hcl 25 Mg Tabs (Promethazine hcl) .... Take one tablet  q 4-6 hrs as needed for nausea 13)  Fosamax 70 Mg Tabs (Alendronate sodium) .... Take as directed weekly 14)  Flecainide Acetate 50 Mg Tabs (Flecainide acetate) .[name].. 1 tablet by mouth every morning and 1/2 tablet po every night 15)  Vitamin D 60454 Unit Caps (Ergocalciferol) .... Take one tablet weekly 16)  Avelox 400 Mg Tabs (Moxifloxacin hcl) .[name].. 1 by mouth once daily    Prescriptions: AVELOX 400 MG TABS (MOXIFLOXACIN HCL) 1 by mouth once daily  #10 x 0   Entered and Authorized by:   Loreen Freud DO   Signed by:   Loreen Freud DO on 03/02/2008   Method used:   Electronically to        [facility] (retail)       [address]       [place], [place]  [address]       Ph: [phone]       Fax: [phone]   RxID:   [id number] FENTANYL 50 MCG/HR  PT72 (FENTANYL) change patch every three days  #10 x 0   Entered by:   Ardyth Man   Authorized by:   Loreen Freud DO   Signed by:   Ardyth Man on 03/02/2008   Method used:   Print then Give to Patient   RxID:   [id number] ENDOCET 10-325 MG  TABS (OXYCODONE-ACETAMINOPHEN) Take  one tablet every 6-8 hours as needed for pain.  #120 x 0   Entered by:   Ardyth Man   Authorized by:   Loreen Freud DO   Signed by:   Ardyth Man on 03/02/2008   Method used:   Print then Give to Patient   RxID:   [id number]  ]

## 2010-04-18 NOTE — Letter (Signed)
Summary: [facility]   Imported By: Lennie Odor 12/15/2009 10:23:54  _____________________________________________________________________  External Attachment:    Type:   Image     Comment:   External Document

## 2010-04-18 NOTE — Assessment & Plan Note (Signed)
Summary: B12 SHOT.  Nurse Visit   Allergies: 1)  ! Talwin 2)  ! Sulfa  Medication Administration  Injection # 1:    Medication: Vit B12 1000 mcg    Diagnosis: B12 DEFICIENCY (ICD-266.2)    Route: IM    Site: L deltoid    Exp Date: 04/20/2011    Lot #: [id number]    Mfr: American Regent    Patient tolerated injection without complications    Given by: June McMurray CMA Duncan Dull) (July 15, 2009 1:34 PM)  Orders Added: 1)  Vit B12 1000 mcg [J3420]   Medication Administration  Injection # 1:    Medication: Vit B12 1000 mcg    Diagnosis: B12 DEFICIENCY (ICD-266.2)    Route: IM    Site: L deltoid    Exp Date: 04/20/2011    Lot #: [id number]    Mfr: American Regent    Patient tolerated injection without complications    Given by: June McMurray CMA Duncan Dull) (July 15, 2009 1:34 PM)  Orders Added: 1)  Vit B12 1000 mcg [J3420]

## 2010-04-18 NOTE — Medication Information (Signed)
Summary: Approval for Ondansetron/[employer]  Approval for Ondansetron/[employer]   Imported By: Lanelle Bal 02/26/2009 08:53:35  _____________________________________________________________________  External Attachment:    Type:   Image     Comment:   External Document

## 2010-04-18 NOTE — Letter (Signed)
Summary: EGD Instructions   Gastroenterology  [address] [place], [place] [address]   Phone: [phone]  Fax: [phone]       [name]    12/13/1945    MRN: [id number]       Procedure Day /Date:02/22/2010     Arrival Time: 1:00 PM     Procedure Time:2:00 PM     Location of Procedure:                     _  x_ [facility] ( Outpatient Registration)    PREPARATION FOR  PEG REPLACEMENT   On12/09/2009,THE DAY OF THE PROCEDURE:  1.   No solid foods, milk or milk products are allowed after midnight the night before your procedure.  2.   Do not drink anything colored red or purple.  Avoid juices with pulp.  No orange juice.  3.  You may drink clear liquids until 10:00 AM, which is 4 hours before your procedure.                                                                                                CLEAR LIQUIDS INCLUDE: Water Jello Ice Popsicles Tea (sugar ok, no milk/cream) Powdered fruit flavored drinks Coffee (sugar ok, no milk/cream) Gatorade Juice: apple, white grape, white cranberry  Lemonade Clear bullion, consomm, broth Carbonated beverages (any kind) Strained chicken noodle soup Hard Candy   MEDICATION INSTRUCTIONS  Unless otherwise instructed, you should take regular prescription medications with a small sip of water as early as possible the morning of your procedure.      Additional medication instructions: _             OTHER INSTRUCTIONS  You will need a responsible adult at least 66 years of age to accompany you and drive you home.   This person must remain in the waiting room during your procedure.  Wear loose fitting clothing that is easily removed.  Leave jewelry and other valuables at home.  However, you may wish to bring a book to read or an iPod/MP3 player to listen to music as you wait for your procedure to start.  Remove all body piercing jewelry and leave at home.  Total time from sign-in until  discharge is approximately 2-3 hours.  You should go home directly after your procedure and rest.  You can resume normal activities the day after your procedure.  The day of your procedure you should not:   Drive   Make legal decisions   Operate machinery   Drink alcohol   Return to work  You will receive specific instructions about eating, activities and medications before you leave.    The above instructions have been mailed  to me by   Jesse Fall, RN    I fully understand and can verbalize these instructions _____________________________ Date _________

## 2010-04-18 NOTE — Progress Notes (Signed)
Summary: [facility]---RX  Phone Note Refill Request   Refills Requested: Medication #1:  TEMAZEPAM 15 MG  CAPS Take one to two capsules at bedtime [facility]--[phone]  Initial call taken by: Freddy Jaksch,  Jul 26, 2008 10:01 AM  Follow-up for Phone Call        last office visit 06/03/08 and last filled 06/22/08..............Marland KitchenDoristine Devoid  Jul 27, 2008 11:14 AM   Additional Follow-up for Phone Call Additional follow up Details #1::        ok to refill x1 Additional Follow-up by: Loreen Freud DO,  Jul 27, 2008 11:43 AM      Prescriptions: TEMAZEPAM 15 MG  CAPS (TEMAZEPAM) Take one to two capsules at bedtime  #60 x 0   Entered by:   Kandice Hams   Authorized by:   Loreen Freud DO   Signed by:   Kandice Hams on 07/27/2008   Method used:   Printed then faxed to ...       [facility] (retail)       [address]       [place], [place]  [address]       Ph: [phone]       Fax: [phone]   RxID:   [id number]

## 2010-04-18 NOTE — Progress Notes (Signed)
Summary: norvasc refill  Phone Note Refill Request Message from:  Fax from Pharmacy on September 26, 2007 10:21 AM  Refills Requested: Medication #1:  NORVASC 10 MG  TABS 1 by mouth qd [facility]-fax:[phone]  Initial call taken by: Doristine Devoid,  September 26, 2007 10:22 AM      Prescriptions: NORVASC 10 MG  TABS (AMLODIPINE BESYLATE) 1 by mouth qd  #30 x 5   Entered by:   Doristine Devoid   Authorized by:   Loreen Freud DO   Signed by:   Doristine Devoid on 09/26/2007   Method used:   Electronically sent to ...       [facility]       [address]       [place], [place]  [address]       Ph: [phone]       Fax: [phone]   RxID:   [id number]

## 2010-04-20 NOTE — Medication Information (Signed)
Summary: Prior Authorization for Ondansetron/[employer]  Prior Authorization for Ondansetron/[employer]   Imported By: Lanelle Bal 03/14/2010 07:30:26  _____________________________________________________________________  External Attachment:    Type:   Image     Comment:   External Document

## 2010-04-20 NOTE — Assessment & Plan Note (Signed)
Summary: ROUTINE OV & B12...AS.    History of Present Illness Visit Type: Follow-up Visit Primary GI MD: [name] MD Primary Provider: Loreen Freud, DO Chief Complaint: routine ROV , no bleeding, patient already had B12 shot History of Present Illness:   This is a 65 year old white female with chronic partial  small bowel obstruction due to adhesions. She is status post exploratory laparotomy and attempted  for lysis of adhesions which was unsuccessful. She has a G-tube for decompression. We just changed her G-tube to a 27F button gastrostomy. She is feeling well. Her weight is maintained at 93 pounds. She has gastroesophageal reflux for which she takes pantoprazole. She is status post recent upper GI bleed due to Pradaxa  for arrhythmia.   GI Review of Systems      Denies abdominal pain, acid reflux, belching, bloating, chest pain, dysphagia with liquids, dysphagia with solids, heartburn, loss of appetite, nausea, vomiting, vomiting blood, weight loss, and  weight gain.        Denies anal fissure, black tarry stools, change in bowel habit, constipation, diarrhea, diverticulosis, fecal incontinence, heme positive stool, hemorrhoids, irritable bowel syndrome, jaundice, light color stool, liver problems, rectal bleeding, and  rectal pain.    Current Medications (verified): 1)  Spiriva Handihaler 18 Mcg Caps (Tiotropium Bromide Monohydrate) .... One Puff Once Daily 2)  Ventolin Hfa 108 (90 Base) Mcg/act Aers (Albuterol Sulfate) .... Two Puffs Up To Four Times Per Day As Needed 3)  Endocet 10-325 Mg  Tabs (Oxycodone-Acetaminophen) .... Take One Tablet Every 6-8 Hours As Needed For Pain. 4)  Fentanyl 50 Mcg/hr  Pt72 (Fentanyl) .... Change Patch Every Three Days 5)  Pantoprazole Sodium 40 Mg Tbec (Pantoprazole Sodium) .... Take 1 Tablet By Mouth Once A Day 6)  Temazepam 15 Mg  Caps (Temazepam) .... Take One To Two Capsules At Bedtime 7)  Cyclobenzaprine Hcl 10 Mg  Tabs (Cyclobenzaprine Hcl)  .... Take One Tablet Three Times Daily 8)  Gabapentin 600 Mg  Tabs (Gabapentin) .... Take One Tablet 4 Times Daily 9)  Zofran 8 Mg Tabs (Ondansetron Hcl) .[name].. 1 By Mouth Q12 H As Needed 10)  Resource Breeze  Liqd (Nutritional Supplements) .... Take 1 Can (250 Calories/can) Two Times A Day Per Peg Tube. Dx: 536.3-Gastroparesis, 787.01-Nausea and Vomiting 11)  Reglan 5 Mg Tabs (Metoclopramide Hcl) .... Take 1 Tablet By Mouth Three Times A Day Before Meals 12)  Furosemide 20 Mg Tabs (Furosemide) .[name].. 1 By Mouth Qd 13)  Nitrostat 0.4 Mg Subl (Nitroglycerin) .... By Mouth 14)  Amiodarone Hcl 200 Mg Tabs (Amiodarone Hcl) .[name].. 1 By Mouth Daily 15)  Carvedilol 6.25 Mg Tabs (Carvedilol) .... 2 By Mouth Daily 16)  Diflucan 100 Mg Tabs (Fluconazole) .... Take 1 Tablet By Mouth Once A Day X 3 Days  Allergies (verified): 1)  ! Talwin 2)  ! Sulfa 3)  ! * Pradaxa  Past History:  Past Medical History: Reviewed history from 02/02/2010 and no changes required. Anemia-iron deficiency CHRONIC PARTIAL SMALL BOWEL OBSTRUCTION Hypertension Osteoarthritis GERD CAD Left bundle branch block Atrial fibrillation - Dr. 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 Intestinal resection (5times),S/P PARTIAL COLECTOMY 2001 Hysterectomy-complete Appendectomy Bladder lift Tonsillectomy Lysis of intestinal adhesions-numerous surgeries/LAST 2003 PICC Line TAH Intestinal surgery again 04/27/2009 06/2009--changed colostomy tube---Dr Dwain Sarna Permanent pacemaker  (12/22/2009)  Family History: Reviewed history from 05/12/2008 and no changes required. Family History of Heart Disease: Father Family History of CVA: Mother No FH of Colon Cancer:  Social History: Reviewed history from 04/07/2009 and no changes required. Occupation: Disabled Alcohol Use - no Illicit Drug Use - no Patient is a former smoker, quit 1988 x91yrs, <1ppd.  Daily Caffeine Use  Review of Systems  The patient denies allergy/sinus, anemia, anxiety-new, arthritis/joint pain, back pain, blood in urine, breast changes/lumps, change in vision, confusion, cough, coughing up blood, depression-new, fainting, fatigue, fever, headaches-new, hearing problems, heart murmur, heart rhythm changes, itching, menstrual pain, muscle pains/cramps, night sweats, nosebleeds, pregnancy symptoms, shortness of breath, skin rash, sleeping problems, sore throat, swelling of feet/legs, swollen lymph glands, thirst - excessive , urination - excessive , urination changes/pain, urine leakage, vision changes, and voice change.         Pertinent positive and negative review of systems were noted in the above HPI. All other ROS was otherwise negative.   Vital Signs:  Patient profile:   65 year old female Height:      62 inches Weight:      93.50 pounds BMI:     17.16 Pulse rate:   72 / minute Pulse rhythm:   regular BP sitting:   112 / 68  (left arm) Cuff size:   regular  Vitals Entered By: June McMurray CMA Duncan Dull) (March 27, 2010 11:46 AM)  Physical Exam  General:  Alert and oriented. Mouth:  No deformity or lesions, dentition normal. Neck:  Supple; no masses or thyromegaly. Lungs:  Clear throughout to auscultation. Heart:  Regular rate and rhythm; no murmurs, rubs,  or bruits. Abdomen:  abdomen with normoactive bowel sounds. Gastrostomy in left upper quadrant. Small amount of clear drainage around the home PEG site. Extremities:  No clubbing, cyanosis, edema or deformities noted. Skin:  Intact  without significant lesions or rashes. Psych:  Alert and cooperative. Normal mood and affect.   Impression & Recommendations:  Problem # 1:  GERD (ICD-530.81) Patient's GERD is controlled with pantoprazole 40 mg daily. I have advised her to take OTC Prilosec for breakthrough symptoms.  Problem # 2:  ABDOMINAL PAIN, CHRONIC (ICD-789.00) Paitent has chronic low-grade small bowel obstruction. She is status post attempted lysis of adhesions. She uses a G-Tube for decompression. It is currently functioning well. She should continue the same regimen.  Problem # 3:  B12 DEFICIENCY (ICD-266.2) check CBC  Patient Instructions: 1)  We will contact [facility] to have them draw a CBC, CMET and Magnesium. 2)  Please schedule a follow-up appointment in 2 months.  3)  Please pick up your prescriptions at the pharmacy. Electronic prescription(s) has already been sent for Pantoprazole. 4)  Copy sent to : Dr Loreen Freud, Dr Myna Hidalgo 5)  The medication list was reviewed and reconciled.  All changed / newly prescribed medications were explained.  A complete medication list was provided to the patient / caregiver. Prescriptions: PANTOPRAZOLE SODIUM 40 MG TBEC (PANTOPRAZOLE SODIUM) Take 1 tablet by mouth once a day  #90 x 0   Entered by:   Lamona Curl CMA (AAMA)   Authorized by:   Hart Carwin MD   Signed by:   Lamona Curl CMA (AAMA)  on 03/27/2010   Method used:   Electronically to        [facility]* (retail)       [address]       [place], [place]  [address]       Ph: [phone]       Fax: [phone]   RxID:   [id number]  I have spoken to [facility] (phone # [phone]) and have asked that they draw a CBC, CMET and magnesium before patient's portacath flush next week. He verbalizes understanding and states that they will do so. Dottie Nelson-Smith CMA (AAMA)  March 27, 2010 2:25 PM

## 2010-04-20 NOTE — Medication Information (Signed)
Summary: Approval for Ondansetron/[employer]  Approval for Ondansetron/[employer]   Imported By: Lanelle Bal 03/10/2010 12:56:26  _____________________________________________________________________  External Attachment:    Type:   Image     Comment:   External Document

## 2010-04-20 NOTE — Assessment & Plan Note (Signed)
Summary: MONTHLY B12 SHOT...LSW.  Nurse Visit   Allergies: 1)  ! Talwin 2)  ! Sulfa 3)  ! * Pradaxa  Medication Administration  Injection # 1:    Medication: Vit B12 1000 mcg    Diagnosis: B12 DEFICIENCY (ICD-266.2)    Route: IM    Site: R deltoid    Exp Date: 12/2011    Lot #: [id number]    Mfr: American Regent    Patient tolerated injection without complications    Given by: Harlow Mares CMA (AAMA) (March 22, 2010 11:02 AM)  Orders Added: 1)  Vit B12 1000 mcg [J3420]

## 2010-04-20 NOTE — Progress Notes (Signed)
Summary: Ondansetron refill  Phone Note Refill Request Message from:  Fax from Pharmacy on February 28, 2010 3:49 PM  Refills Requested: Medication #1:  ZOFRAN 8 MG TABS 1 by mouth q12 h as needed   Last Refilled: 05/25/2009  Medication #2:  ENDOCET 10-325 MG  TABS Take one tablet every 6-8 hours as needed for pain.   Last Refilled: 01/31/2010  Medication #3:  FENTANYL 50 MCG/HR  PT72 change patch every three days   Last Refilled: 01/31/2010 [facility], [address], [place], [place]  [phone], fax - [phone]   qty =20  Next Appointment Scheduled: none Initial call taken by: Jerolyn Shin,  February 28, 2010 3:50 PM  Follow-up for Phone Call        spoke pt and stated she is having her issues with nausea and diarrhea wanted to get a refill on Zofran, I asked has she spoken with GI doc, and she stated she was told to take Immodium. Pt unable to have anymore surgery due to the damage previous surgeries have caused. says she took her last Zofran today. Also requested Refill on  pain meds, Last filled both on 01/31/10. Please advise Follow-up by: Almeta Monas CMA Duncan Dull),  February 28, 2010 4:08 PM  Additional Follow-up for Phone Call Additional follow up Details #1::        ok to refill x1 -----2 refills Additional Follow-up by: Loreen Freud DO,  February 28, 2010 4:34 PM    Additional Follow-up for Phone Call Additional follow up Details #2::    Pt aware Rx ready for pick up..... Follow-up by: Almeta Monas CMA Duncan Dull),  February 28, 2010 4:43 PM  Prescriptions: FENTANYL 50 MCG/HR  PT72 (FENTANYL) change patch every three days  #10 x 0   Entered by:   Almeta Monas CMA (AAMA)   Authorized by:   Loreen Freud DO   Signed by:   Almeta Monas CMA (AAMA) on 02/28/2010   Method used:   Print then Give to Patient   RxID:   [id number] ENDOCET 10-325 MG  TABS (OXYCODONE-ACETAMINOPHEN) Take one tablet every 6-8 hours as needed for pain.  #120 x 0   Entered by:    Almeta Monas CMA (AAMA)   Authorized by:   Loreen Freud DO   Signed by:   Almeta Monas CMA (AAMA) on 02/28/2010   Method used:   Print then Give to Patient   RxID:   [id number] ZOFRAN 8 MG TABS (ONDANSETRON HCL) 1 by mouth q12 h as needed  #20 x 2   Entered by:   Almeta Monas CMA (AAMA)   Authorized by:   Loreen Freud DO   Signed by:   Almeta Monas CMA (AAMA) on 02/28/2010   Method used:   Faxed to ...       [facility] (retail)       [address]       [place], [place]  [address]       Ph: [phone]       Fax: [phone]   RxID:   [id number]

## 2010-04-20 NOTE — Progress Notes (Signed)
Summary: c/o swelling in right arm   Phone Note Outgoing Call   Call placed by: Lorne Skeens,  April 05, 2010 4:49 PM Call placed to: Patient Summary of Call: call patient to r.s her appt with dr. Graciela Husbands from [facility]. pt started to  c/o about  swelling in left arm for about a week. advise pt i would send a message to nurse.  Initial call taken by: Lorne Skeens,  April 05, 2010 4:51 PM  Follow-up for Phone Call        spoke w/pt who states that sometimes her arm swells above the elbow and sometimes below. pt states no pain, warmth, or redness.  she also states that this is the area that swelled after she had it put in. questioned her again about going on coumadin and she said Dr. Deborah Chalk said she did not need to go on it. she went on pradaxa before and it caused  bleeding from her esophageal ulcers per pt. Spoke w/PA regarding see pt and he recommends her having an u/s of arm to r/o dvt.  Follow-up by: [name] RN,  April 06, 2010 4:39 PM  Additional Follow-up for Phone Call Additional follow up Details #1::        pt aware that u/s to be ordered and expecting call to be scheduled.  Additional Follow-up by: [name] RN,  April 06, 2010 4:43 PM

## 2010-04-20 NOTE — Progress Notes (Signed)
Summary: REFILL  Phone Note Refill Request Call back at [phone] Message from:  Pharmacy on March 21, 2010 11:48 AM  Refills Requested: Medication #1:  TEMAZEPAM 15 MG  CAPS Take one to two capsules at bedtime   Dosage confirmed as above?Dosage Confirmed   Supply Requested: 1 month [facility]  Next Appointment Scheduled: 1.4.12 Initial call taken by: Lavell Islam,  March 21, 2010 11:48 AM  Follow-up for Phone Call        last seen 01/06/10 and filled 12/19/09 with 2 refills....please advise  Additional Follow-up for Phone Call Additional follow up Details #1::        refill x1  2 refills Additional Follow-up by: Loreen Freud DO,  March 21, 2010 4:50 PM    Prescriptions: TEMAZEPAM 15 MG  CAPS (TEMAZEPAM) Take one to two capsules at bedtime  #60 x 2   Entered by:   Almeta Monas CMA (AAMA)   Authorized by:   Loreen Freud DO   Signed by:   Almeta Monas CMA (AAMA) on 03/21/2010   Method used:   Printed then faxed to ...       [facility] (retail)       [address]       [place], [place]  [address]       Ph: [phone]       Fax: [phone]   RxID:   [id number]

## 2010-04-20 NOTE — Progress Notes (Signed)
Summary: Refill Request  Phone Note Refill Request Call back at [phone] Message from:  Pharmacy on April 07, 2010 10:15 AM  Refills Requested: Medication #1:  CYCLOBENZAPRINE HCL 10 MG  TABS Take one tablet three times daily   Dosage confirmed as above?Dosage Confirmed   Supply Requested: 90   Last Refilled: 03/06/2010 [facility]  Next Appointment Scheduled: none Initial call taken by: Harold Barban,  April 07, 2010 10:18 AM  Follow-up for Phone Call        last seen 01/06/10 and filled 03/06/10 please advise Follow-up by: Almeta Monas CMA Duncan Dull),  April 07, 2010 10:29 AM  Additional Follow-up for Phone Call Additional follow up Details #1::        refill x1  2 refills Additional Follow-up by: Loreen Freud DO,  April 07, 2010 11:55 AM    Prescriptions: CYCLOBENZAPRINE HCL 10 MG  TABS (CYCLOBENZAPRINE HCL) Take one tablet three times daily  #90 x 2   Entered and Authorized by:   Loreen Freud DO   Signed by:   Loreen Freud DO on 04/07/2010   Method used:   Electronically to        [facility] (retail)       [address]       [place], [place]  [address]       Ph: [phone]       Fax: [phone]   RxID:   [id number]

## 2010-04-20 NOTE — Miscellaneous (Signed)
Summary: Overnight oximetry on 2 liters   Clinical Lists Changes 9hrs 38 min test time.  Mean SpO2 96%, low 92%.  Will have my nurse inform pt that oxygen level looks good with 2 liters oxygen at night.  Appended Document: Overnight oximetry on 2 liters LMOMTCB.  Appended Document: Overnight oximetry on 2 liters Pt returned call.  She was informed that o2 level looks good with 2 L o2 at night.  She verbalized understanding of this

## 2010-04-20 NOTE — Procedures (Signed)
Summary: [facility]  [facility]ximetry/[facility]   Imported By: Lester [place] 03/23/2010 08:31:20  _____________________________________________________________________  External Attachment:    Type:   Image     Comment:   External Document

## 2010-04-20 NOTE — Letter (Signed)
Summary: [facility]   Imported By: Sherian Rein 04/05/2010 08:43:45  _____________________________________________________________________  External Attachment:    Type:   Image     Comment:   External Document

## 2010-04-20 NOTE — Progress Notes (Signed)
Summary: Fentanyl, Endocet refills  Phone Note Refill Request Call back at Home Phone [phone] Message from:  Patient on April 12, 2010 10:22 AM  Refills Requested: Medication #1:  FENTANYL 50 MCG/HR  PT72 change patch every three days  Medication #2:  ENDOCET 10-325 MG  TABS Take one tablet every 6-8 hours as needed for pain. advised patient that prescriptions would be ready for pickup on Thursday 1/26 after 10:00am unless we called her at [phone]  Next Appointment Scheduled: none Initial call taken by: Jerolyn Shin,  April 12, 2010 10:24 AM  Follow-up for Phone Call        last seen 01/06/10 and filled 02/28/10 please advise.... Almeta Monas CMA Duncan Dull)  April 12, 2010 11:32 AM   Additional Follow-up for Phone Call Additional follow up Details #1::        refill x1  both Additional Follow-up by: Loreen Freud DO,  April 12, 2010 12:38 PM    Additional Follow-up for Phone Call Additional follow up Details #2::    pt aware Rx ready for pick up tomorrow... Almeta Monas CMA Duncan Dull)  April 12, 2010 2:28 PM   Prescriptions: ENDOCET 10-325 MG  TABS (OXYCODONE-ACETAMINOPHEN) Take one tablet every 6-8 hours as needed for pain.  #120 x 0   Entered by:   Almeta Monas CMA (AAMA)   Authorized by:   Loreen Freud DO   Signed by:   Almeta Monas CMA (AAMA) on 04/12/2010   Method used:   Print then Give to Patient   RxID:   [id number] FENTANYL 50 MCG/HR  PT72 (FENTANYL) change patch every three days  #10 x 0   Entered by:   Almeta Monas CMA (AAMA)   Authorized by:   Loreen Freud DO   Signed by:   Almeta Monas CMA (AAMA) on 04/12/2010   Method used:   Print then Give to Patient   RxID:   [id number]

## 2010-04-20 NOTE — Progress Notes (Signed)
Summary: prior auth APPROVED  ZOFRAN  [employer]  Phone Note Refill Request Message from:  Fax from Pharmacy on March 01, 2010 10:45 AM  Refills Requested: Medication #1:  ZOFRAN 8 MG TABS 1 by mouth q12 h as needed prior [name] [id number] - id [id number]  Initial call taken by: Okey Regal [place],  March 01, 2010 10:47 AM  Follow-up for Phone Call        [phone]. awaiting fax...........Marland KitchenFelecia Deloach CMA  March 01, 2010 11:11 AM  Prior auth faxed back awaiting response........Marland KitchenFelecia Deloach CMA  March 01, 2010 4:05 PM   incoming call Prior auth approved per representative  03-01-10 until 03-01-11. Pharmacy faxed awaiting approval letter...Marland KitchenMarland KitchenFelecia Deloach CMA  March 02, 2010 4:04 PM

## 2010-04-20 NOTE — Procedures (Signed)
Summary: Oximetry/[facility]  Oxim[facility]   Imported By: Lester [place] 03/06/2010 08:13:51  _____________________________________________________________________  External Attachment:    Type:   Image     Comment:   External Document

## 2010-04-21 ENCOUNTER — Telehealth: Payer: Self-pay | Admitting: Family Medicine

## 2010-04-21 NOTE — Procedures (Signed)
Summary: [facility]  [facility]   Imported By: Lester [place] 01/26/2010 09:07:16  _____________________________________________________________________  External Attachment:    Type:   Image     Comment:   External Document

## 2010-04-21 NOTE — Letter (Signed)
[facility]   Imported By: Lanelle Bal 12/25/2007 10:52:26  _____________________________________________________________________  External Attachment:    Type:   Image     Comment:   External Document

## 2010-04-21 NOTE — Medication Information (Signed)
Summary: Letter Regarding Cyclobenzaprine Use in Seniors/[facility]  Letter Regarding Cyclobenzaprine Use in Seniors/[facility]   Imported By: Lanelle Bal 01/20/2010 08:30:20  _____________________________________________________________________  External Attachment:    Type:   Image     Comment:   External Document

## 2010-04-26 NOTE — Progress Notes (Signed)
Summary: Refill  Phone Note Call from Patient Call back at Home Phone [phone]   Caller: Patient Summary of Call: Pt states she has started back taking her Meloxicam. Medication is not on active med list and pt is requesting refill Please advise. Pt uses [facility] Initial call taken by: Lavell Islam,  April 21, 2010 9:18 AM  Follow-up for Phone Call        spoke with patient and she stated her joints started hurting againa dn wanted to get a refill.Marland KitchenMarland KitchenMeloxicam taken off of list in November 2011....please advise Follow-up by: Almeta Monas CMA Duncan Dull),  April 21, 2010 10:11 AM  Additional Follow-up for Phone Call Additional follow up Details #1::        ok to send mobic 15 mg  #30  1/2 - 1 by mouth once daily as needed  5 refills Additional Follow-up by: Loreen Freud DO,  April 21, 2010 10:48 AM    Additional Follow-up for Phone Call Additional follow up Details #2::    pt aware and Prolia injection scheduled Follow-up by: Almeta Monas CMA (AAMA),  April 21, 2010 3:03 PM  New/Updated Medications: MOBIC 15 MG TABS (MELOXICAM) 1/2-1 by mouth as needed Prescriptions: MOBIC 15 MG TABS (MELOXICAM) 1/2-1 by mouth as needed  #30 x 5   Entered by:   Almeta Monas CMA (AAMA)   Authorized by:   Loreen Freud DO   Signed by:   Almeta Monas CMA (AAMA) on 04/21/2010   Method used:   Faxed to ...       [facility] (retail)       [address]       [place], [place]  [address]       Ph: [phone]       Fax: [phone]   RxID:   [id number]

## 2010-04-26 NOTE — Progress Notes (Signed)
Summary: Checking status-- to see if patiet would like Prolia inj 1/30  Phone Note Outgoing Call   Call placed by: Almeta Monas CMA Duncan Dull),  April 17, 2010 8:46 AM Call placed to: Patient Details for Reason: Need to see if patient wants the Prolia injection Summary of Call: Left message to call back.... Almeta Monas CMA Duncan Dull)  April 17, 2010 8:46 AM  pt aware of Prolia and stated she recieved an approval letter from her ins company and agreed to injection, stated she would call to schedule appt..... Almeta Monas CMA (AAMA)  April 21, 2010 10:12 AM

## 2010-04-27 ENCOUNTER — Ambulatory Visit (INDEPENDENT_AMBULATORY_CARE_PROVIDER_SITE_OTHER): Payer: Medicare Other | Admitting: Family Medicine

## 2010-04-27 ENCOUNTER — Encounter (INDEPENDENT_AMBULATORY_CARE_PROVIDER_SITE_OTHER): Payer: Self-pay | Admitting: *Deleted

## 2010-04-27 DIAGNOSIS — M810 Age-related osteoporosis without current pathological fracture: Secondary | ICD-10-CM

## 2010-04-27 DIAGNOSIS — M1990 Unspecified osteoarthritis, unspecified site: Secondary | ICD-10-CM

## 2010-05-03 IMAGING — XA IR REPLACE G-TUBE/COLONIC TUBE
1 series · 9 of 9 positions shown · IV contrast (agent unspecified)
Comparison: None.

CLINICAL DATA: History of multiple bowel surgeries.  A large bore
gastrostomy tube has been placed surgically and is used for
decompression of a distended stomach.  The patient requires
replacement of the tube due to leakage.

GASTROSTOMY CATHETER REPLACEMENT
Contrast:  15 ml Ymnipaque-WFF
Fluoro time:  0.4 minutes

[Series 1: run · 9 of 9 slices shown]
[im 1/9]
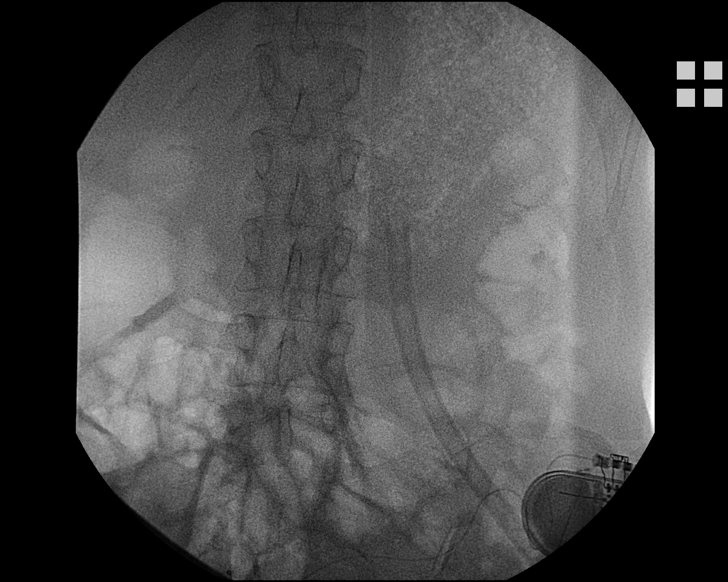
[im 2/9]
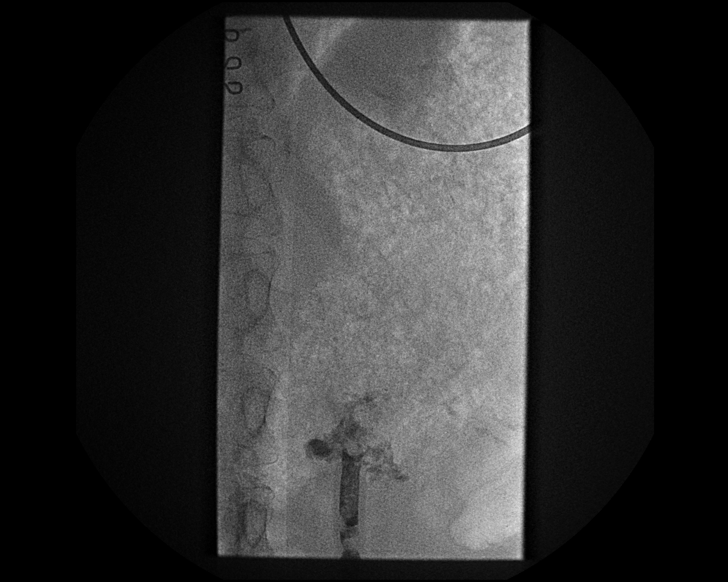
[im 3/9]
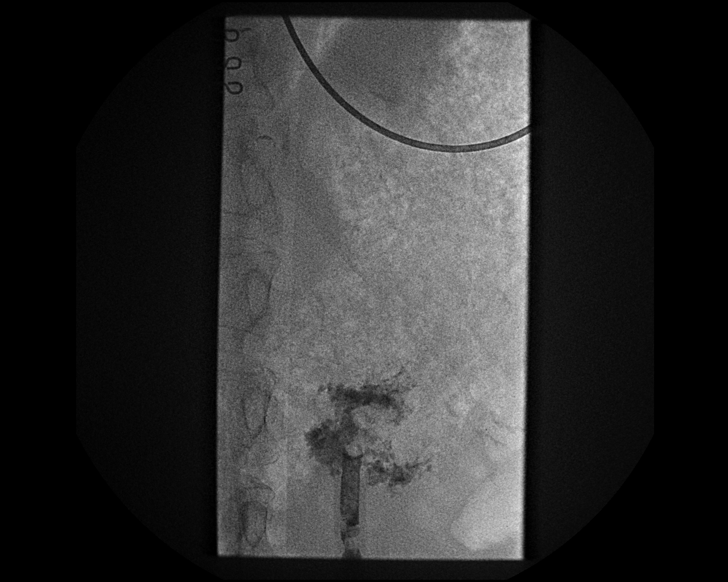
[im 4/9]
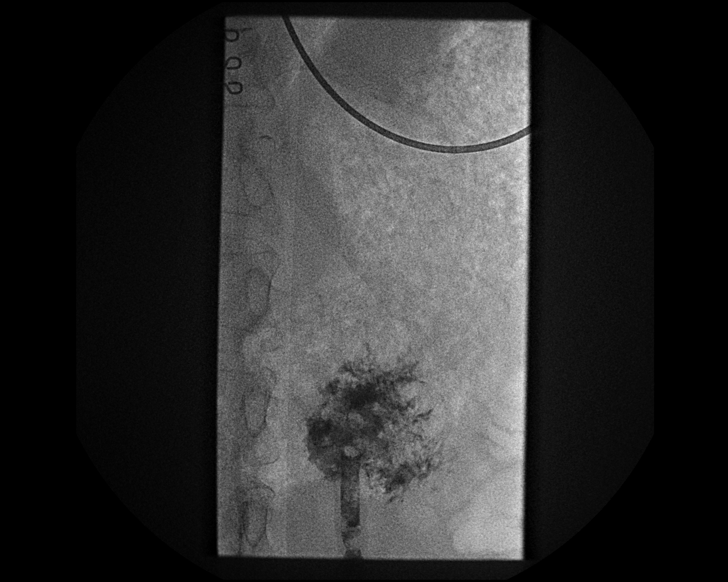
[im 5/9]
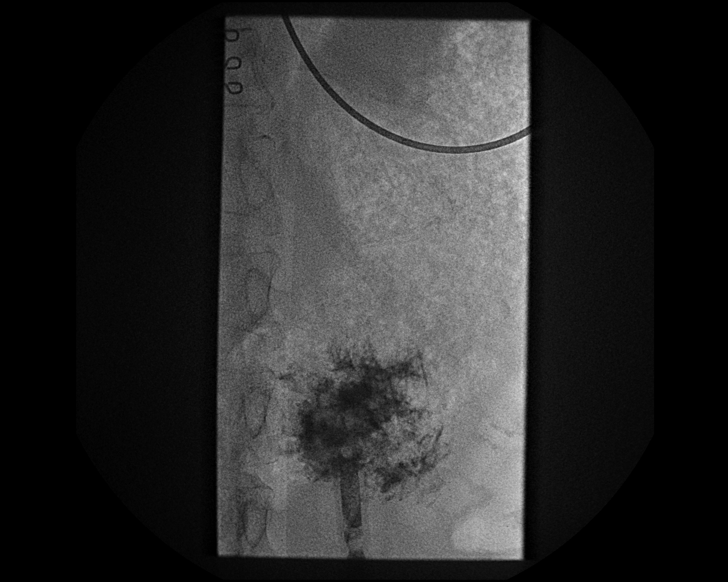
[im 6/9]
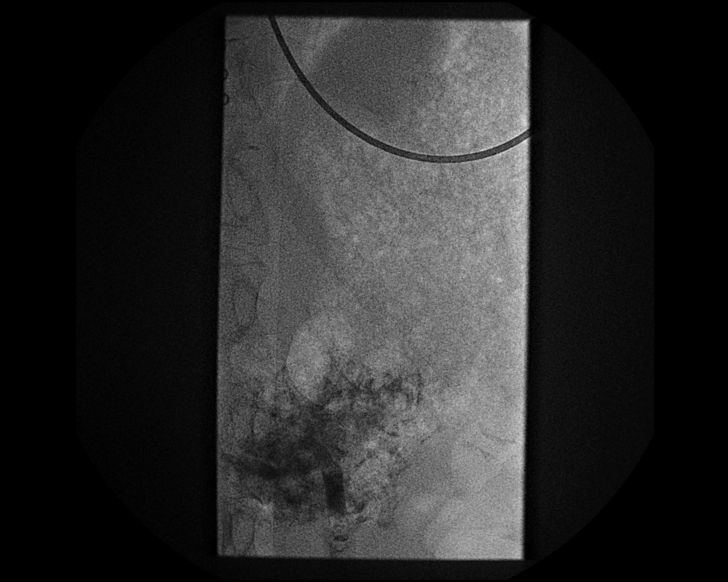
[im 7/9]
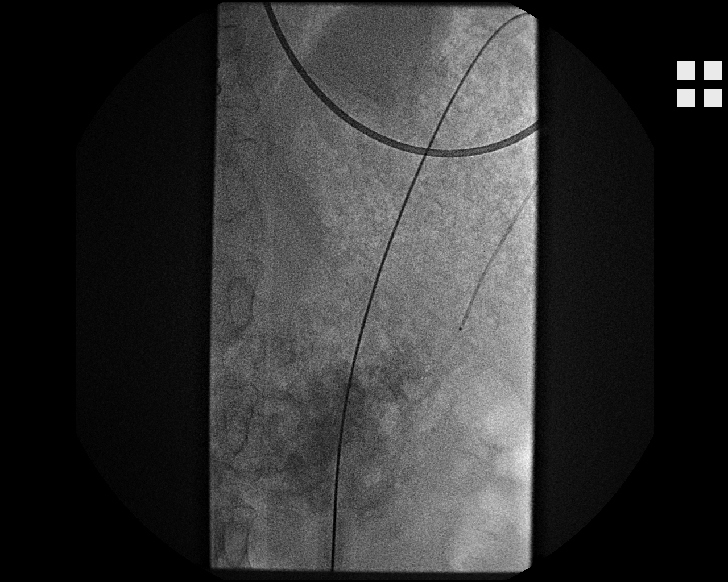
[im 8/9]
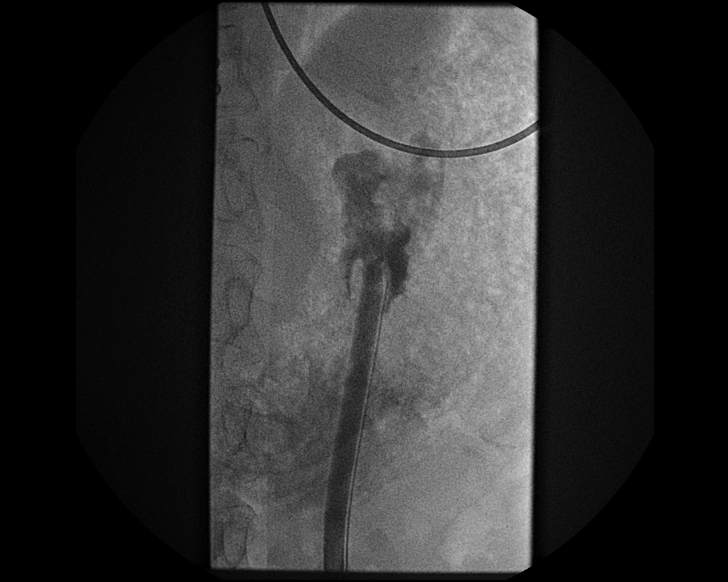
[im 9/9]
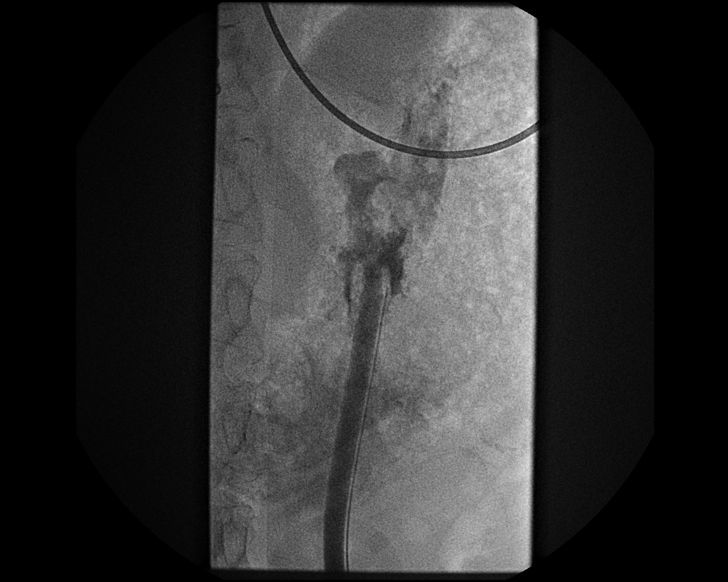

[9 of 9 positions shown; findings below may reference images not displayed]

Preexisting 28-French gastrostomy tube was injected with contrast.
The tube was prepped.  Both viscous lidocaine and 1% lidocaine were
utilized for local anesthesia.  Retention sutures were cut.  The
tube was then cut and removed over a guidewire.  A new 26-French
balloon retention gastrostomy tube was then advanced into the
stomach.  Retention balloon was inflated with 8 ml of saline.
Contrast injection was performed to confirm position.  The tube was
connected to wall suction after placement then flushed with saline.
FINDINGS: Fluoroscopy demonstrates a massively distended stomach
which is filled with visible debris.  After removal of the 28-
French gastrostomy, a 26-French balloon retention catheter was
advanced into the stomach.  This was connected to wall suction to
try to decompress the stomach resulting in yield of only
approximately 100 ml of material.  The stomach could not be further
decompressed due to undigested food and debris which limited the
amount that was able to be aspirated from the tube.
IMPRESSION: Replacement of gastrostomy tube as above.  The stomach is noted to
be very distended and filled with debris.  A 26-French balloon
retention gastrostomy tube was placed.

## 2010-05-03 NOTE — Op Note (Signed)
  NAMEJOHANNY, [name]               ACCOUNT NO.:  [id number]  MEDICAL RECORD NO.:  [id number]          PATIENT TYPE:  OIB  LOCATION:  2899                         FACILITY:  [facility]  PHYSICIAN:  Hillis Range, MD       DATE OF BIRTH:  1945/07/26  DATE OF PROCEDURE: DATE OF DISCHARGE:                              OPERATIVE REPORT   SURGEON:  Hillis Range, MD  PREPROCEDURE DIAGNOSIS:  Left arm swelling with presumed left subclavian occlusion.  POSTPROCEDURE DIAGNOSES:  Left subclavian venous occlusion with good collateral flow.  PROCEDURES:  Left upper extremity venography.  INTRODUCTION:  [name] is a pleasant 65 year old female, status post biventricular pacemaker implantation in October 2011 by Dr. Graciela Husbands. Several days later, she had early swelling of her left arm and therefore underwent a venogram by Dr. Graciela Husbands, which I do not have documentation of, but apparently showed occlusion of the left subclavian vein.  He was placed on Pradaxa, but unfortunately, subsequently had GI bleeding.  She did very well until recently.  Most recently, she reports worsening swelling of her left arm.  She therefore presents today for further evaluation of her left arm swelling with repeat venography.  DESCRIPTION OF PROCEDURE:  Informed written consent was obtained and the patient was brought to the Electrophysiology Lab in the fasting state. She did not require sedation for the procedure.  An IV was placed in the left antecubital vein.  A venogram of the left upper extremity was performed by hand injection of 10 mL of nonionic contrast.  This demonstrated a moderate-sized left axillary vein as well as a moderate- sized left cephalic vein, which were both patent.  Both veins emptied into a large left subclavian vein.  The proximal portion of the left subclavian vein, however, was completely occluded.  There was very mature and robust collateral flow from the proximal portion of the  left subclavian vein to a more distal portion of the subclavian vein.  There were no early apparent complications.  CONCLUSIONS: 1. Occlusion of the proximal third of the left subclavian vein with     mature collateral reconstitution thereafter. 2. No early apparent complications.  Upon discussion with both the patient and Dr. Graciela Husbands, it is felt that her subclavian stenosis is now chronic and clearly her collateral flow has matured.  I think that we should treat her supportively with arm elevation.  Given her very significant GI bleeding with Pradaxa in the past, I am quite reluctant to start the patient on Coumadin at this time as I believe that the benefits of this medicine are clearly outweighed by the risk of bleeding.  Dr. Graciela Husbands agrees and we will therefore follow the patient in the office supportively.     Hillis Range, MD     JA/MEDQ  D:  04/11/2010  T:  04/12/2010  Job:  [id number]  cc:   Duke Salvia, MD, [facility] Colleen Can. Deborah Chalk, M.D.  Electronically Signed by Hillis Range MD on 05/03/2010 10:20:50 PM

## 2010-05-04 NOTE — Assessment & Plan Note (Signed)
Summary: prolia injection  Medications Added PROLIA 60 MG/ML SOLN (DENOSUMAB) Given 04/27/10      Allergies Added:  Nurse Visit   Vital Signs:  Patient profile:   65 year old female Weight:      93.6 pounds Temp:     98.1 degrees F oral BP sitting:   90 / 60  (right arm) Cuff size:   regular  Vitals Entered By: Almeta Monas CMA Duncan Dull) (April 27, 2010 11:10 AM) CC: Prolia Injection   Current Medications (verified): 1)  Spiriva Handihaler 18 Mcg Caps (Tiotropium Bromide Monohydrate) .... One Puff Once Daily 2)  Ventolin Hfa 108 (90 Base) Mcg/act Aers (Albuterol Sulfate) .... Two Puffs Up To Four Times Per Day As Needed 3)  Endocet 10-325 Mg  Tabs (Oxycodone-Acetaminophen) .... Take One Tablet Every 6-8 Hours As Needed For Pain. 4)  Fentanyl 50 Mcg/hr  Pt72 (Fentanyl) .... Change Patch Every Three Days 5)  Pantoprazole Sodium 40 Mg Tbec (Pantoprazole Sodium) .... Take 1 Tablet By Mouth Once A Day 6)  Temazepam 15 Mg  Caps (Temazepam) .... Take One To Two Capsules At Bedtime 7)  Cyclobenzaprine Hcl 10 Mg  Tabs (Cyclobenzaprine Hcl) .... Take One Tablet Three Times Daily 8)  Gabapentin 600 Mg  Tabs (Gabapentin) .... Take One Tablet 4 Times Daily 9)  Zofran 8 Mg Tabs (Ondansetron Hcl) .[name].. 1 By Mouth Q12 H As Needed 10)  Resource Breeze  Liqd (Nutritional Supplements) .... Take 1 Can (250 Calories/can) Two Times A Day Per Peg Tube. Dx: 536.3-Gastroparesis, 787.01-Nausea and Vomiting 11)  Reglan 5 Mg Tabs (Metoclopramide Hcl) .... Take 1 Tablet By Mouth Three Times A Day Before Meals 12)  Furosemide 20 Mg Tabs (Furosemide) .[name].. 1 By Mouth Qd 13)  Nitrostat 0.4 Mg Subl (Nitroglycerin) .... By Mouth 14)  Amiodarone Hcl 200 Mg Tabs (Amiodarone Hcl) .[name].. 1 By Mouth Daily 15)  Carvedilol 6.25 Mg Tabs (Carvedilol) .... 2 By Mouth Daily 16)  Diflucan 100 Mg Tabs (Fluconazole) .... Take 1 Tablet By Mouth Once A Day X 3 Days 17)  Magnesium Oxide 400 Mg Tabs (Magnesium Oxide) .... Take One  Bid 18)  Mobic 15 Mg Tabs (Meloxicam) .... 1/2-1 By Mouth As Needed 19)  Prolia 60 Mg/ml Soln (Denosumab) .... Given 04/27/10  Allergies (verified): 1)  ! Talwin 2)  ! Sulfa 3)  ! * Pradaxa  Medication Administration  Injection # 1:    Medication: Prolia 60mg     Diagnosis: OSTEOPOROSIS (ICD-733.00)    Route: SQ    Site: L deltoid    Exp Date: 04/20/2011    Lot #: [id number]    Mfr: [id number]    Patient tolerated injection without complications    Given by: Almeta Monas CMA (AAMA) (April 27, 2010 11:12 AM)  Orders Added: 1)  Prolia 60mg  [J3590] 2)  Admin of Therapeutic Inj  intramuscular or subcutaneous [96372]  Appended Document: prolia injection     Allergies: 1)  ! Talwin 2)  ! Sulfa 3)  ! * Pradaxa   Complete Medication List: 1)  Spiriva Handihaler 18 Mcg Caps (Tiotropium bromide monohydrate) .... One puff once daily 2)  Ventolin Hfa 108 (90 Base) Mcg/act Aers (Albuterol sulfate) .... Two puffs up to four times per day as needed 3)  Endocet 10-325 Mg Tabs (Oxycodone-acetaminophen) .... Take one tablet every 6-8 hours as needed for pain. 4)  Fentanyl 50 Mcg/hr Pt72 (Fentanyl) .... Change patch every three days 5)  Pantoprazole Sodium 40 Mg Tbec (  Pantoprazole sodium) .... Take 1 tablet by mouth once a day 6)  Temazepam 15 Mg Caps (Temazepam) .... Take one to two capsules at bedtime 7)  Cyclobenzaprine Hcl 10 Mg Tabs (Cyclobenzaprine hcl) .... Take one tablet three times daily 8)  Gabapentin 600 Mg Tabs (Gabapentin) .... Take one tablet 4 times daily 9)  Zofran 8 Mg Tabs (Ondansetron hcl) .[name].. 1 by mouth q12 h as needed 10)  Resource Breeze Liqd (Nutritional supplements) .... Take 1 can (250 calories/can) two times a day per peg tube. dx: 536.3-gastroparesis, 787.01-nausea and vomiting 11)  Reglan 5 Mg Tabs (Metoclopramide hcl) .... Take 1 tablet by mouth three times a day before meals 12)  Furosemide 20 Mg Tabs (Furosemide) .[name].. 1 by mouth qd 13)  Nitrostat 0.4 Mg Subl  (Nitroglycerin) .... By mouth 14)  Amiodarone Hcl 200 Mg Tabs (Amiodarone hcl) .[name].. 1 by mouth daily 15)  Carvedilol 6.25 Mg Tabs (Carvedilol) .... 2 by mouth daily 16)  Diflucan 100 Mg Tabs (Fluconazole) .... Take 1 tablet by mouth once a day x 3 days 17)  Magnesium Oxide 400 Mg Tabs (Magnesium oxide) .... Take one bid 18)  Mobic 15 Mg Tabs (Meloxicam) .... 1/2-1 by mouth as needed 19)  Prolia 60 Mg/ml Soln (Denosumab) .... Given 04/27/10   Orders Added: 1)  Est. Patient Level I [04540]

## 2010-05-04 NOTE — Letter (Signed)
Summary: ProliaPlus  ProliaPlus   Imported By: Kassie Mends 04/27/2010 11:11:38  _____________________________________________________________________  External Attachment:    Type:   Image     Comment:   External Document

## 2010-05-16 ENCOUNTER — Encounter: Payer: Self-pay | Admitting: Internal Medicine

## 2010-05-16 ENCOUNTER — Encounter (INDEPENDENT_AMBULATORY_CARE_PROVIDER_SITE_OTHER): Payer: Medicare Other

## 2010-05-16 ENCOUNTER — Telehealth (INDEPENDENT_AMBULATORY_CARE_PROVIDER_SITE_OTHER): Payer: Self-pay | Admitting: *Deleted

## 2010-05-16 DIAGNOSIS — E538 Deficiency of other specified B group vitamins: Secondary | ICD-10-CM

## 2010-05-17 ENCOUNTER — Telehealth: Payer: Self-pay | Admitting: Family Medicine

## 2010-05-19 ENCOUNTER — Encounter: Payer: Self-pay | Admitting: Family Medicine

## 2010-05-19 ENCOUNTER — Ambulatory Visit (INDEPENDENT_AMBULATORY_CARE_PROVIDER_SITE_OTHER): Payer: Medicare Other | Admitting: Family Medicine

## 2010-05-19 DIAGNOSIS — R300 Dysuria: Secondary | ICD-10-CM | POA: Insufficient documentation

## 2010-05-19 LAB — CONVERTED CEMR LAB
Bilirubin Urine: NEGATIVE
Blood in Urine, dipstick: NEGATIVE
Glucose, Urine, Semiquant: NEGATIVE
Ketones, urine, test strip: NEGATIVE
Nitrite: NEGATIVE
Protein, U semiquant: NEGATIVE
Specific Gravity, Urine: 1.015
Urobilinogen, UA: 0.2
WBC Urine, dipstick: NEGATIVE
pH: 6

## 2010-05-20 ENCOUNTER — Encounter: Payer: Self-pay | Admitting: Family Medicine

## 2010-05-24 ENCOUNTER — Other Ambulatory Visit: Payer: Self-pay | Admitting: Internal Medicine

## 2010-05-24 ENCOUNTER — Encounter: Payer: Self-pay | Admitting: Internal Medicine

## 2010-05-24 ENCOUNTER — Ambulatory Visit (INDEPENDENT_AMBULATORY_CARE_PROVIDER_SITE_OTHER): Payer: Medicare Other | Admitting: Internal Medicine

## 2010-05-24 ENCOUNTER — Other Ambulatory Visit: Payer: Medicare Other

## 2010-05-24 DIAGNOSIS — R633 Feeding difficulties, unspecified: Secondary | ICD-10-CM

## 2010-05-24 DIAGNOSIS — D509 Iron deficiency anemia, unspecified: Secondary | ICD-10-CM

## 2010-05-24 DIAGNOSIS — K56609 Unspecified intestinal obstruction, unspecified as to partial versus complete obstruction: Secondary | ICD-10-CM

## 2010-05-24 LAB — COMPREHENSIVE METABOLIC PANEL
ALT: 14 U/L (ref 0–35)
AST: 21 U/L (ref 0–37)
Albumin: 2.6 g/dL — ABNORMAL LOW (ref 3.5–5.2)
Alkaline Phosphatase: 86 U/L (ref 39–117)
BUN: 21 mg/dL (ref 6–23)
CO2: 26 mEq/L (ref 19–32)
Calcium: 6.8 mg/dL — ABNORMAL LOW (ref 8.4–10.5)
Chloride: 107 mEq/L (ref 96–112)
Creatinine, Ser: 1.1 mg/dL (ref 0.4–1.2)
GFR: 54.61 mL/min — ABNORMAL LOW (ref >60.00)
Glucose, Bld: 87 mg/dL (ref 70–99)
Potassium: 5.1 mEq/L (ref 3.5–5.1)
Sodium: 139 mEq/L (ref 135–145)
Total Bilirubin: 0.2 mg/dL — ABNORMAL LOW (ref 0.3–1.2)
Total Protein: 5.5 g/dL — ABNORMAL LOW (ref 6.0–8.3)

## 2010-05-24 LAB — CBC WITH DIFFERENTIAL/PLATELET
Basophils Absolute: 0 10*3/uL (ref 0.0–0.1)
Basophils Relative: 0.7 % (ref 0.0–3.0)
Eosinophils Absolute: 0.2 10*3/uL (ref 0.0–0.7)
Eosinophils Relative: 3 % (ref 0.0–5.0)
HCT: 32.3 % — ABNORMAL LOW (ref 36.0–46.0)
Hemoglobin: 10.8 g/dL — ABNORMAL LOW (ref 12.0–15.0)
Lymphocytes Relative: 10.3 % — ABNORMAL LOW (ref 12.0–46.0)
Lymphs Abs: 0.7 10*3/uL (ref 0.7–4.0)
MCHC: 33.3 g/dL (ref 30.0–36.0)
MCV: 89.5 fl (ref 78.0–100.0)
Monocytes Absolute: 0.6 10*3/uL (ref 0.1–1.0)
Monocytes Relative: 8.1 % (ref 3.0–12.0)
Neutro Abs: 5.5 10*3/uL (ref 1.4–7.7)
Neutrophils Relative %: 77.9 % — ABNORMAL HIGH (ref 43.0–77.0)
Platelets: 250 10*3/uL (ref 150.0–400.0)
RBC: 3.6 Mil/uL — ABNORMAL LOW (ref 3.87–5.11)
RDW: 15.1 % — ABNORMAL HIGH (ref 11.5–14.6)
WBC: 7 10*3/uL (ref 4.5–10.5)

## 2010-05-24 LAB — IBC PANEL
Iron: 54 ug/dL (ref 42–145)
Saturation Ratios: 22.3 % (ref 20.0–50.0)
Transferrin: 173.1 mg/dL — ABNORMAL LOW (ref 212.0–360.0)

## 2010-05-24 LAB — MAGNESIUM: Magnesium: 2 mg/dL (ref 1.5–2.5)

## 2010-05-25 NOTE — Assessment & Plan Note (Signed)
Summary: UTI   Vital Signs:  Patient profile:   65 year old female Weight:      95 pounds BMI:     17.44 Temp:     98.0 degrees F oral BP sitting:   100 / 58  (left arm)  Vitals Entered By: Doristine Devoid CMA (May 19, 2010 4:10 PM) CC: UTI, Dysuria   History of Present Illness:  Dysuria      This is a 65 year old woman who presents with Dysuria.  The patient complains of burning with urination and urinary frequency, but denies urgency, hematuria, vaginal discharge, vaginal itching, vaginal sores, and penile discharge.  The patient denies the following associated symptoms: nausea, vomiting, fever, shaking chills, flank pain, abdominal pain, back pain, pelvic pain, and arthralgias.  The patient denies the following risk factors: diabetes, prior antibiotics, immunosuppression, history of GU anomaly, history of pyelonephritis, pregnancy, history of STD, and analgesic abuse.  History is significant for no urinary tract problems.    Current Medications (verified): 1)  Spiriva Handihaler 18 Mcg Caps (Tiotropium Bromide Monohydrate) .... One Puff Once Daily 2)  Ventolin Hfa 108 (90 Base) Mcg/act Aers (Albuterol Sulfate) .... Two Puffs Up To Four Times Per Day As Needed 3)  Endocet 10-325 Mg  Tabs (Oxycodone-Acetaminophen) .... Take One Tablet Every 6-8 Hours As Needed For Pain. 4)  Fentanyl 50 Mcg/hr  Pt72 (Fentanyl) .... Change Patch Every Three Days 5)  Pantoprazole Sodium 40 Mg Tbec (Pantoprazole Sodium) .... Take 1 Tablet By Mouth Once A Day 6)  Temazepam 15 Mg  Caps (Temazepam) .... Take One To Two Capsules At Bedtime 7)  Cyclobenzaprine Hcl 10 Mg  Tabs (Cyclobenzaprine Hcl) .... Take One Tablet Three Times Daily 8)  Gabapentin 600 Mg  Tabs (Gabapentin) .... Take One Tablet 4 Times Daily 9)  Zofran 8 Mg Tabs (Ondansetron Hcl) .[name].. 1 By Mouth Q12 H As Needed 10)  Resource Breeze  Liqd (Nutritional Supplements) .... Take 1 Can (250 Calories/can) Two Times A Day Per Peg Tube. Dx:  536.3-Gastroparesis, 787.01-Nausea and Vomiting 11)  Reglan 5 Mg Tabs (Metoclopramide Hcl) .... Take 1 Tablet By Mouth Three Times A Day Before Meals 12)  Furosemide 20 Mg Tabs (Furosemide) .[name].. 1 By Mouth Qd 13)  Nitrostat 0.4 Mg Subl (Nitroglycerin) .... By Mouth 14)  Amiodarone Hcl 200 Mg Tabs (Amiodarone Hcl) .[name].. 1 By Mouth Daily 15)  Carvedilol 6.25 Mg Tabs (Carvedilol) .... 2 By Mouth Daily 16)  Diflucan 100 Mg Tabs (Fluconazole) .... Take 1 Tablet By Mouth Once A Day X 3 Days 17)  Magnesium Oxide 400 Mg Tabs (Magnesium Oxide) .... Take One Bid 18)  Mobic 15 Mg Tabs (Meloxicam) .... 1/2-1 By Mouth As Needed 19)  Prolia 60 Mg/ml Soln (Denosumab) .... Given 04/27/10 20)  Cipro 500 Mg Tabs (Ciprofloxacin Hcl) .[name].. 1 By Mouth Two Times A Day  Allergies (verified): 1)  ! Talwin 2)  ! Sulfa 3)  ! * Pradaxa  Past History:  Past medical, surgical, family and social histories (including risk factors) reviewed for relevance to current acute and chronic problems.  Past Medical History: Reviewed history from 02/02/2010 and no changes required. Anemia-iron deficiency CHRONIC PARTIAL SMALL BOWEL OBSTRUCTION Hypertension Osteoarthritis GERD CAD Left bundle branch block Atrial fibrillation - Dr. 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 Intestinal resection (5times),S/P PARTIAL COLECTOMY 2001 Hysterectomy-complete Appendectomy Bladder lift Tonsillectomy Lysis of intestinal adhesions-numerous surgeries/LAST 2003 PICC Line TAH Intestinal  surgery again 04/27/2009 06/2009--changed colostomy tube---Dr Dwain Sarna Permanent pacemaker (12/22/2009)  Family History: Reviewed history from 05/12/2008 and no changes required. Family History of Heart Disease: Father Family History of CVA: Mother No FH of Colon Cancer:  Social History: Reviewed history from 04/07/2009 and no changes required. Occupation: Disabled Alcohol Use - no Illicit Drug Use - no Patient is a former smoker, quit 1988 x40yrs, <1ppd.  Daily Caffeine Use  Review of Systems      See HPI  Physical Exam  General:  Well-developed,well-nourished,in no acute distress; alert,appropriate and cooperative throughout examination Psych:  Cognition and judgment appear intact. Alert and cooperative with normal attention span and concentration. No apparent delusions, illusions, hallucinations   Impression & Recommendations:  Problem # 1:  DYSURIA (ICD-788.1)  Her updated medication list for this problem includes:    Cipro 500 Mg Tabs (Ciprofloxacin hcl) .[name]... 1 by mouth two times a day  Orders: UA Dipstick w/o Micro (manual) (30865) Specimen Handling (99000) T-Culture, Urine ([id number]) Prescription Created Electronically [phone])  Encouraged to push clear liquids, get enough rest, and take acetaminophen as needed. To be seen in 10 days if no improvement, sooner if worse.  Complete Medication List: 1)  Spiriva Handihaler 18 Mcg Caps (Tiotropium bromide monohydrate) .... One puff once daily 2)  Ventolin Hfa 108 (90 Base) Mcg/act Aers (Albuterol sulfate) .... Two puffs up to four times per day as needed 3)  Endocet 10-325 Mg Tabs (Oxycodone-acetaminophen) .... Take one tablet every 6-8 hours as needed for pain. 4)  Fentanyl 50 Mcg/hr Pt72 (Fentanyl) .... Change patch every three days 5)  Pantoprazole Sodium 40 Mg Tbec (Pantoprazole sodium) .... Take 1 tablet by mouth once a day 6)  Temazepam 15 Mg Caps (Temazepam) .... Take one to two capsules at bedtime 7)   Cyclobenzaprine Hcl 10 Mg Tabs (Cyclobenzaprine hcl) .... Take one tablet three times daily 8)  Gabapentin 600 Mg Tabs (Gabapentin) .... Take one tablet 4 times daily 9)  Zofran 8 Mg Tabs (Ondansetron hcl) .[name].. 1 by mouth q12 h as needed 10)  Resource Breeze Liqd (Nutritional supplements) .... Take 1 can (250 calories/can) two times a day per peg tube. dx: 536.3-gastroparesis, 787.01-nausea and vomiting 11)  Reglan 5 Mg Tabs (Metoclopramide hcl) .... Take 1 tablet by mouth three times a day before meals 12)  Furosemide 20 Mg Tabs (Furosemide) .[name].. 1 by mouth qd 13)  Nitrostat 0.4 Mg Subl (Nitroglycerin) .... By mouth 14)  Amiodarone Hcl 200 Mg Tabs (Amiodarone hcl) .[name].. 1 by mouth daily 15)  Carvedilol 6.25 Mg Tabs (Carvedilol) .... 2 by mouth daily 16)  Diflucan 100 Mg Tabs (Fluconazole) .... Take 1 tablet by mouth once a day x 3 days 17)  Magnesium Oxide 400 Mg Tabs (Magnesium oxide) .... Take one bid 18)  Mobic 15 Mg Tabs (Meloxicam) .... 1/2-1 by mouth as needed 19)  Prolia 60 Mg/ml Soln (Denosumab) .... Given 04/27/10 20)  Cipro 500 Mg Tabs (Ciprofloxacin hcl) .[name].. 1 by mouth two times a day Prescriptions: CIPRO 500 MG TABS (CIPROFLOXACIN HCL) 1 by mouth two times a day  #10 x 0  Entered and Authorized by:   Loreen Freud DO   Signed by:   Loreen Freud DO on 05/19/2010   Method used:   Print then Give to Patient   RxID:   [id number]    Orders Added: 1)  UA Dipstick w/o Micro (manual) [81002] 2)  Specimen Handling [99000] 3)  T-Culture, Urine [[id number]] 4)  Est. Patient Level III [30865] 5)  Prescription Created Electronically [phone]    Laboratory Results   Urine Tests    Routine Urinalysis   Glucose: negative   (Normal Range: Negative) Bilirubin: negative   (Normal Range: Negative) Ketone: negative   (Normal Range: Negative) Spec. Gravity: 1.015   (Normal Range: 1.003-1.035) Blood: negative   (Normal Range: Negative) pH: 6.0   (Normal Range: 5.0-8.0) Protein:  negative   (Normal Range: Negative) Urobilinogen: 0.2   (Normal Range: 0-1) Nitrite: negative   (Normal Range: Negative) Leukocyte Esterace: negative   (Normal Range: Negative)    Comments: has very strong odor sent for cx ........Marland KitchenDoristine Devoid CMA  May 19, 2010 4:15 PM

## 2010-05-25 NOTE — Assessment & Plan Note (Signed)
Summary: MONTLY B12  Nurse Visit   Allergies: 1)  ! Talwin 2)  ! Sulfa 3)  ! * Pradaxa  Medication Administration  Injection # 1:    Medication: Vit B12 1000 mcg    Diagnosis: B12 DEFICIENCY (ICD-266.2)    Route: IM    Site: L deltoid    Exp Date: 01/18/2012    Lot #: [id number]    Mfr: [employer] Regent    Comments: Made appt for pt for next B12 injection, 06-14-2010 at 11 AM.    Patient tolerated injection without complications    Given by: Lowry Ram NCMA (May 16, 2010 11:25 AM)  Orders Added: 1)  Vit B12 1000 mcg [J3420]

## 2010-05-25 NOTE — Progress Notes (Signed)
Summary: Endocet, Fentanyl refills  Phone Note Refill Request Call back at Home Phone [phone] Message from:  Patient on May 17, 2010 9:32 AM  Refills Requested: Medication #1:  ENDOCET 10-325 MG  TABS Take one tablet every 6-8 hours as needed for pain.   Last Refilled: 04/12/2010  Medication #2:  FENTANYL 50 MCG/HR  PT72 change patch every three days   Last Refilled: 04/12/2010 please call patient when prescriptions are ready for pickup  Next Appointment Scheduled: Wed 5/9   [place] Initial call taken by: Jerolyn Shin,  May 17, 2010 9:34 AM  Follow-up for Phone Call        last seen 01/06/10 and filled 04/12/10... please advise Follow-up by: Almeta Monas CMA Duncan Dull),  May 17, 2010 11:32 AM  Additional Follow-up for Phone Call Additional follow up Details #1::        ok to refillx1 each Additional Follow-up by: Loreen Freud DO,  May 17, 2010 12:03 PM    Additional Follow-up for Phone Call Additional follow up Details #2::    pt aware Rx ready for pickup....  Follow-up by: Almeta Monas CMA Duncan Dull),  May 17, 2010 2:20 PM  Prescriptions: FENTANYL 50 MCG/HR  PT72 (FENTANYL) change patch every three days  #10 x 0   Entered and Authorized by:   Loreen Freud DO   Signed by:   Loreen Freud DO on 05/17/2010   Method used:   Print then Give to Patient   RxID:   [id number] ENDOCET 10-325 MG  TABS (OXYCODONE-ACETAMINOPHEN) Take one tablet every 6-8 hours as needed for pain.  #120 x 0   Entered and Authorized by:   Loreen Freud DO   Signed by:   Loreen Freud DO on 05/17/2010   Method used:   Print then Give to Patient   RxID:   [id number]

## 2010-05-25 NOTE — Progress Notes (Signed)
 ----   Converted from flag ---- ---- 05/16/2010 11:47 AM, Lowry Ram NCMA wrote: I gave Ms  Mallen her B12 inj today.  She wants to be worked in before  Dr Delia Chimes next available which is early April.  I didn't promise her anything but she asked for Dr Delia Chimes nurse to call her.  Thanks. ------------------------------  Phone Note Outgoing Call   Call placed by: Jesse Fall, RN Call placed to: Patient Summary of Call: Spoke with patient and scheduled her for an appointment with Dr. Juanda Chance on 06/05/10 at 9:30 AM Initial call taken by: Jesse Fall RN,  May 16, 2010 1:40 PM

## 2010-05-29 ENCOUNTER — Ambulatory Visit (HOSPITAL_COMMUNITY)
Admission: RE | Admit: 2010-05-29 | Discharge: 2010-05-29 | Disposition: A | Discharge status: Home / Self Care | Payer: Medicare Other | Source: Ambulatory Visit | Attending: Internal Medicine | Admitting: Internal Medicine

## 2010-05-29 ENCOUNTER — Encounter: Payer: Medicare Other | Admitting: Internal Medicine

## 2010-05-29 DIAGNOSIS — IMO0001 Reserved for inherently not codable concepts without codable children: Secondary | ICD-10-CM | POA: Insufficient documentation

## 2010-05-29 DIAGNOSIS — K219 Gastro-esophageal reflux disease without esophagitis: Secondary | ICD-10-CM | POA: Insufficient documentation

## 2010-05-29 DIAGNOSIS — K912 Postsurgical malabsorption, not elsewhere classified: Secondary | ICD-10-CM | POA: Insufficient documentation

## 2010-05-29 DIAGNOSIS — R633 Feeding difficulties, unspecified: Secondary | ICD-10-CM

## 2010-05-29 DIAGNOSIS — K565 Intestinal adhesions [bands], unspecified as to partial versus complete obstruction: Secondary | ICD-10-CM | POA: Insufficient documentation

## 2010-05-29 DIAGNOSIS — Z95810 Presence of automatic (implantable) cardiac defibrillator: Secondary | ICD-10-CM | POA: Insufficient documentation

## 2010-05-29 DIAGNOSIS — D509 Iron deficiency anemia, unspecified: Secondary | ICD-10-CM | POA: Insufficient documentation

## 2010-05-29 DIAGNOSIS — R109 Unspecified abdominal pain: Secondary | ICD-10-CM | POA: Insufficient documentation

## 2010-05-29 DIAGNOSIS — E538 Deficiency of other specified B group vitamins: Secondary | ICD-10-CM | POA: Insufficient documentation

## 2010-05-29 DIAGNOSIS — Z431 Encounter for attention to gastrostomy: Secondary | ICD-10-CM | POA: Insufficient documentation

## 2010-05-29 DIAGNOSIS — G8929 Other chronic pain: Secondary | ICD-10-CM | POA: Insufficient documentation

## 2010-05-29 DIAGNOSIS — I4891 Unspecified atrial fibrillation: Secondary | ICD-10-CM | POA: Insufficient documentation

## 2010-05-29 DIAGNOSIS — K56609 Unspecified intestinal obstruction, unspecified as to partial versus complete obstruction: Secondary | ICD-10-CM

## 2010-05-30 ENCOUNTER — Telehealth: Payer: Self-pay | Admitting: Internal Medicine

## 2010-05-30 NOTE — Assessment & Plan Note (Signed)
Summary: 2 month f/u...sch w/patient    History of Present Illness Visit Type: Follow-up Visit Primary GI MD: [name] MD Primary Provider: Loreen Freud, DO Requesting Provider: na Chief Complaint: Two month ROV follow up. Pt states that she is doing better no GI complaints but having alot of fatigue  History of Present Illness:   This is a 65 year old white female with chronic small bowel obstructions due to adhesions. She has chronic anemia and malabsorption. She is status post exploratory laparotomy and lysis of adhesions several months ago. She has a G-tube for decompression. The tube has not been used recently. Her weight has increased from 93 to 97 pounds. Patient has occasional episodes of abdominal pain, nausea and vomiting. She is status post upper GI bleed secondary to Pradaxa. Her overall level of energy has been low. She has a history of fibromyalgia, COPD, atrial fibrillation and B12 deficiency. She is status post biventricular pacer implantation and subsequent left subclavian venous occlusion.   GI Review of Systems      Denies abdominal pain, acid reflux, belching, bloating, chest pain, dysphagia with liquids, dysphagia with solids, heartburn, loss of appetite, nausea, vomiting, vomiting blood, weight loss, and  weight gain.        Denies anal fissure, black tarry stools, change in bowel habit, constipation, diarrhea, diverticulosis, fecal incontinence, heme positive stool, hemorrhoids, irritable bowel syndrome, jaundice, light color stool, liver problems, rectal bleeding, and  rectal pain.    Current Medications (verified): 1)  Spiriva Handihaler 18 Mcg Caps (Tiotropium Bromide Monohydrate) .... One Puff Once Daily 2)  Ventolin Hfa 108 (90 Base) Mcg/act Aers (Albuterol Sulfate) .... Two Puffs Up To Four Times Per Day As Needed 3)  Endocet 10-325 Mg  Tabs (Oxycodone-Acetaminophen) .... Take One Tablet Every 6-8 Hours As Needed For Pain. 4)  Fentanyl 50 Mcg/hr  Pt72  (Fentanyl) .... Change Patch Every Three Days 5)  Pantoprazole Sodium 40 Mg Tbec (Pantoprazole Sodium) .... Take 1 Tablet By Mouth Once A Day 6)  Temazepam 15 Mg  Caps (Temazepam) .... Take One To Two Capsules At Bedtime 7)  Cyclobenzaprine Hcl 10 Mg  Tabs (Cyclobenzaprine Hcl) .... Take One Tablet Three Times Daily 8)  Gabapentin 600 Mg  Tabs (Gabapentin) .... Take One Tablet 4 Times Daily 9)  Zofran 8 Mg Tabs (Ondansetron Hcl) .[name].. 1 By Mouth Q12 H As Needed 10)  Resource Breeze  Liqd (Nutritional Supplements) .... Take 1 Can (250 Calories/can) Two Times A Day Per Peg Tube. Dx: 536.3-Gastroparesis, 787.01-Nausea and Vomiting 11)  Reglan 5 Mg Tabs (Metoclopramide Hcl) .... Take 1 Tablet By Mouth Three Times A Day Before Meals 12)  Furosemide 20 Mg Tabs (Furosemide) .[name].. 1 By Mouth Qd 13)  Nitrostat 0.4 Mg Subl (Nitroglycerin) .... By Mouth 14)  Amiodarone Hcl 200 Mg Tabs (Amiodarone Hcl) .[name].. 1 By Mouth Daily 15)  Carvedilol 6.25 Mg Tabs (Carvedilol) .... 2 By Mouth Daily 16)  Magnesium Oxide 400 Mg Tabs (Magnesium Oxide) .... Take One Bid 17)  Mobic 15 Mg Tabs (Meloxicam) .... 1/2-1 By Mouth As Needed 18)  Prolia 60 Mg/ml Soln (Denosumab) .... Given 04/27/10 19)  Cipro 500 Mg Tabs (Ciprofloxacin Hcl) .... One Tablet By Mouth Two Times A Day For Uti  Allergies (verified): 1)  ! Talwin 2)  ! Sulfa 3)  ! * Pradaxa  Past History:  Past Medical History: Anemia-iron deficiency CHRONIC PARTIAL SMALL BOWEL OBSTRUCTION Hypertension Osteoarthritis GERD CAD Left bundle branch block Atrial fibrillation - Dr. 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 Intestinal resection (5times),S/P PARTIAL COLECTOMY 2001 Hysterectomy-complete Appendectomy Bladder lift Tonsillectomy Lysis of intestinal adhesions-numerous surgeries/LAST 2003 PICC Line TAH Intestinal surgery again 04/27/2009 06/2009--changed colostomy tube---Dr Dwain Sarna Permanent pacemaker (12/22/2009)  Family History: Reviewed history from 05/12/2008 and no changes required. Family History of Heart Disease: Father Family History of CVA: Mother No FH of Colon Cancer:  Social History: Reviewed history from 04/07/2009 and no changes required. Occupation: Disabled Alcohol Use - no Illicit Drug Use - no Patient is a former smoker, quit 1988 x75yrs, <1ppd.  Daily Caffeine Use  Review of Systems       The patient complains of fatigue.  The patient denies allergy/sinus, anemia, anxiety-new, arthritis/joint pain, back pain, blood in urine, breast changes/lumps, change in vision, confusion, cough, coughing up blood, depression-new, fainting, fever, headaches-new, hearing problems, heart murmur, heart rhythm changes, itching, menstrual pain, muscle pains/cramps, night sweats, nosebleeds, pregnancy symptoms, shortness of breath, skin rash, sleeping problems, sore throat, swelling of feet/legs, swollen lymph glands, thirst - excessive , urination - excessive , urination changes/pain, urine leakage, vision changes, and voice change.         Pertinent positive and negative review of systems were noted in the above HPI. All other ROS was otherwise negative.   Vital Signs:  Patient profile:   65 year old female Height:      62 inches Weight:      97 pounds BMI:     17.81 BSA:     1.41 Pulse rate:   60 / minute Pulse rhythm:   regular BP sitting:   118 / 64  (left arm) Cuff size:   regular  Vitals Entered By: Ok Anis CMA (May 24, 2010 10:09 AM)  Physical  Exam  General:  thin, alert and oriented, chronically ill-appearing. Eyes:  nonicteric. Mouth:  normal mucosa. Neck:  Supple; no masses or thyromegaly. Lungs:  Clear throughout to auscultation. Heart:  Regular rate and rhythm; no murmurs, rubs,  or bruits. Abdomen:  PEG tube in left upper quadrant. Macerated irritated mucosa around the gastrostomy. Some leakage of tube feedings. Soft abdomen somewhat distended with increased tympany in lower abdomen. No palpable mass. Extremities:  No clubbing, cyanosis, edema or deformities noted. Skin:  Intact without significant lesions or rashes. Psych:  Alert and cooperative. Normal mood and affect.   Impression & Recommendations:  Problem # 1:  ABDOMINAL PAIN, CHRONIC (ICD-789.00) Patient has chronic partial small bowel obstruction. Her G-tube is not functioning, she has not been able to use it for decompression for weeks. The peri gastrostomy skin is macerated. We will plan to remove it. She needs to continue her hydration through a PICC line. We will recheck her chemistries today. We will recheck her magnesium. If normal, I would stop the magnesium oxide because it causes diarrhea.  Problem # 2:  GASTROENTERITIS (ICD-558.9) Patient has chronic diarrhea due to short bowel syndrome. Patient has gastroesophageal reflux due to partial small bowel obstruction. She is to continue on PPIs and small frequent feedings.  Problem # 3:  ANEMIA-IRON DEFICIENCY (ICD-280.9) recheck CBC and Iron  today.  Other Orders: ZEGD (ZEGD) TLB-CBC Platelet - w/Differential (85025-CBCD) TLB-CMP (Comprehensive Metabolic Pnl) (80053-COMP) TLB-IBC Pnl (Iron/FE;Transferrin) (83550-IBC) TLB-Magnesium (Mg) (83735-MG)  Patient Instructions: 1)  You have been scheduled for removal of your PEG tube @ [facility] on Monday 05/29/10 @ 9:30 am. You will need to arrive around 8:30 am for registration @ outpatient registration. There is no prep. 2)  Your physician requests  that you go to the basement floor of our office to have the following labwork completed before leaving today: Magnesium, CMET, CBC, IBC panel 3)  The medication list was reviewed and reconciled.  All changed / newly prescribed medications were explained.  A complete medication list was provided to the patient / caregiver.

## 2010-05-31 LAB — PROTIME-INR
INR: 1.08 (ref 0.00–1.49)
INR: 1.4 (ref 0.00–1.49)
INR: 1.75 — ABNORMAL HIGH (ref 0.00–1.49)
Prothrombin Time: 14.2 seconds (ref 11.6–15.2)
Prothrombin Time: 17.4 seconds — ABNORMAL HIGH (ref 11.6–15.2)
Prothrombin Time: 20.6 seconds — ABNORMAL HIGH (ref 11.6–15.2)

## 2010-05-31 LAB — CBC
HCT: 19.6 % — ABNORMAL LOW (ref 36.0–46.0)
HCT: 23.3 % — ABNORMAL LOW (ref 36.0–46.0)
HCT: 24 % — ABNORMAL LOW (ref 36.0–46.0)
HCT: 28.9 % — ABNORMAL LOW (ref 36.0–46.0)
HCT: 31.6 % — ABNORMAL LOW (ref 36.0–46.0)
Hemoglobin: 10.8 g/dL — ABNORMAL LOW (ref 12.0–15.0)
Hemoglobin: 6.5 g/dL — CL (ref 12.0–15.0)
Hemoglobin: 7.9 g/dL — ABNORMAL LOW (ref 12.0–15.0)
Hemoglobin: 8.2 g/dL — ABNORMAL LOW (ref 12.0–15.0)
Hemoglobin: 9.8 g/dL — ABNORMAL LOW (ref 12.0–15.0)
MCH: 29.2 pg (ref 26.0–34.0)
MCH: 29.3 pg (ref 26.0–34.0)
MCH: 29.7 pg (ref 26.0–34.0)
MCH: 29.9 pg (ref 26.0–34.0)
MCH: 30 pg (ref 26.0–34.0)
MCHC: 33.3 g/dL (ref 30.0–36.0)
MCHC: 33.9 g/dL (ref 30.0–36.0)
MCHC: 34 g/dL (ref 30.0–36.0)
MCHC: 34.2 g/dL (ref 30.0–36.0)
MCHC: 34.4 g/dL (ref 30.0–36.0)
MCV: 86.3 fL (ref 78.0–100.0)
MCV: 86.4 fL (ref 78.0–100.0)
MCV: 87.8 fL (ref 78.0–100.0)
MCV: 87.8 fL (ref 78.0–100.0)
MCV: 87.9 fL (ref 78.0–100.0)
Platelets: 306 10*3/uL (ref 150–400)
Platelets: 332 10*3/uL (ref 150–400)
Platelets: 351 10*3/uL (ref 150–400)
Platelets: 393 10*3/uL (ref 150–400)
Platelets: 411 10*3/uL — ABNORMAL HIGH (ref 150–400)
RBC: 2.23 MIL/uL — ABNORMAL LOW (ref 3.87–5.11)
RBC: 2.7 MIL/uL — ABNORMAL LOW (ref 3.87–5.11)
RBC: 2.77 MIL/uL — ABNORMAL LOW (ref 3.87–5.11)
RBC: 3.29 MIL/uL — ABNORMAL LOW (ref 3.87–5.11)
RBC: 3.6 MIL/uL — ABNORMAL LOW (ref 3.87–5.11)
RDW: 15 % (ref 11.5–15.5)
RDW: 15.1 % (ref 11.5–15.5)
RDW: 15.2 % (ref 11.5–15.5)
RDW: 15.2 % (ref 11.5–15.5)
RDW: 15.7 % — ABNORMAL HIGH (ref 11.5–15.5)
WBC: 12 10*3/uL — ABNORMAL HIGH (ref 4.0–10.5)
WBC: 12.5 10*3/uL — ABNORMAL HIGH (ref 4.0–10.5)
WBC: 13.6 10*3/uL — ABNORMAL HIGH (ref 4.0–10.5)
WBC: 8.1 10*3/uL (ref 4.0–10.5)
WBC: 8.4 10*3/uL (ref 4.0–10.5)

## 2010-05-31 LAB — BASIC METABOLIC PANEL
BUN: 10 mg/dL (ref 6–23)
BUN: 11 mg/dL (ref 6–23)
BUN: 29 mg/dL — ABNORMAL HIGH (ref 6–23)
BUN: 41 mg/dL — ABNORMAL HIGH (ref 6–23)
CO2: 27 mEq/L (ref 19–32)
CO2: 27 mEq/L (ref 19–32)
CO2: 28 mEq/L (ref 19–32)
CO2: 30 mEq/L (ref 19–32)
Calcium: 7.2 mg/dL — ABNORMAL LOW (ref 8.4–10.5)
Calcium: 7.5 mg/dL — ABNORMAL LOW (ref 8.4–10.5)
Calcium: 7.6 mg/dL — ABNORMAL LOW (ref 8.4–10.5)
Calcium: 8.2 mg/dL — ABNORMAL LOW (ref 8.4–10.5)
Chloride: 101 mEq/L (ref 96–112)
Chloride: 101 mEq/L (ref 96–112)
Chloride: 108 mEq/L (ref 96–112)
Chloride: 108 mEq/L (ref 96–112)
Creatinine, Ser: 0.7 mg/dL (ref 0.4–1.2)
Creatinine, Ser: 0.83 mg/dL (ref 0.4–1.2)
Creatinine, Ser: 0.98 mg/dL (ref 0.4–1.2)
Creatinine, Ser: 1.3 mg/dL — ABNORMAL HIGH (ref 0.4–1.2)
GFR calc Af Amer: 50 mL/min — ABNORMAL LOW (ref >60)
GFR calc Af Amer: >60 mL/min (ref >60)
GFR calc Af Amer: >60 mL/min (ref >60)
GFR calc Af Amer: >60 mL/min (ref >60)
GFR calc non Af Amer: 41 mL/min — ABNORMAL LOW (ref >60)
GFR calc non Af Amer: 57 mL/min — ABNORMAL LOW (ref >60)
GFR calc non Af Amer: >60 mL/min (ref >60)
GFR calc non Af Amer: >60 mL/min (ref >60)
Glucose, Bld: 113 mg/dL — ABNORMAL HIGH (ref 70–99)
Glucose, Bld: 86 mg/dL (ref 70–99)
Glucose, Bld: 87 mg/dL (ref 70–99)
Glucose, Bld: 94 mg/dL (ref 70–99)
Potassium: 3.5 mEq/L (ref 3.5–5.1)
Potassium: 3.5 mEq/L (ref 3.5–5.1)
Potassium: 3.7 mEq/L (ref 3.5–5.1)
Potassium: 4.1 mEq/L (ref 3.5–5.1)
Sodium: 137 mEq/L (ref 135–145)
Sodium: 139 mEq/L (ref 135–145)
Sodium: 140 mEq/L (ref 135–145)
Sodium: 141 mEq/L (ref 135–145)

## 2010-05-31 LAB — MRSA PCR SCREENING: MRSA by PCR: NEGATIVE

## 2010-05-31 LAB — PREPARE FRESH FROZEN PLASMA
Unit division: 0
Unit division: 0
Unit division: 0

## 2010-05-31 LAB — CROSSMATCH
ABO/RH(D): A NEG
Antibody Screen: NEGATIVE
Unit division: 0
Unit division: 0
Unit division: 0
Unit division: 0

## 2010-05-31 LAB — APTT: aPTT: 82 seconds — ABNORMAL HIGH (ref 24–37)

## 2010-05-31 LAB — URINE CULTURE
Colony Count: NO GROWTH
Culture  Setup Time: 201110220336
Culture: NO GROWTH

## 2010-05-31 LAB — HEPATIC FUNCTION PANEL
ALT: 10 U/L (ref 0–35)
AST: 12 U/L (ref 0–37)
Albumin: 2.4 g/dL — ABNORMAL LOW (ref 3.5–5.2)
Alkaline Phosphatase: 56 U/L (ref 39–117)
Bilirubin, Direct: 0.1 mg/dL (ref 0.0–0.3)
Indirect Bilirubin: 0.2 mg/dL — ABNORMAL LOW (ref 0.3–0.9)
Total Bilirubin: 0.3 mg/dL (ref 0.3–1.2)
Total Protein: 5.3 g/dL — ABNORMAL LOW (ref 6.0–8.3)

## 2010-05-31 LAB — URINALYSIS, ROUTINE W REFLEX MICROSCOPIC
Bilirubin Urine: NEGATIVE
Glucose, UA: NEGATIVE mg/dL
Hgb urine dipstick: NEGATIVE
Ketones, ur: NEGATIVE mg/dL
Nitrite: NEGATIVE
Protein, ur: NEGATIVE mg/dL
Specific Gravity, Urine: 1.018 (ref 1.005–1.030)
Urobilinogen, UA: 0.2 mg/dL (ref 0.0–1.0)
pH: 5.5 (ref 5.0–8.0)

## 2010-05-31 LAB — DIFFERENTIAL
Basophils Absolute: 0 10*3/uL (ref 0.0–0.1)
Basophils Relative: 0 % (ref 0–1)
Eosinophils Absolute: 0.1 10*3/uL (ref 0.0–0.7)
Eosinophils Relative: 1 % (ref 0–5)
Lymphocytes Relative: 4 % — ABNORMAL LOW (ref 12–46)
Lymphs Abs: 0.4 10*3/uL — ABNORMAL LOW (ref 0.7–4.0)
Monocytes Absolute: 0.7 10*3/uL (ref 0.1–1.0)
Monocytes Relative: 6 % (ref 3–12)
Neutro Abs: 10.7 10*3/uL — ABNORMAL HIGH (ref 1.7–7.7)
Neutrophils Relative %: 90 % — ABNORMAL HIGH (ref 43–77)

## 2010-05-31 LAB — HEMOGLOBIN
Hemoglobin: 7.8 g/dL — ABNORMAL LOW (ref 12.0–15.0)
Hemoglobin: 8.4 g/dL — ABNORMAL LOW (ref 12.0–15.0)

## 2010-05-31 LAB — ABO/RH: ABO/RH(D): A NEG

## 2010-05-31 LAB — CARDIAC PANEL(CRET KIN+CKTOT+MB+TROPI)
CK, MB: 1 ng/mL (ref 0.3–4.0)
CK, MB: 1.2 ng/mL (ref 0.3–4.0)
CK, MB: 1.4 ng/mL (ref 0.3–4.0)
Relative Index: INVALID (ref 0.0–2.5)
Relative Index: INVALID (ref 0.0–2.5)
Relative Index: INVALID (ref 0.0–2.5)
Total CK: 36 U/L (ref 7–177)
Total CK: 36 U/L (ref 7–177)
Total CK: 41 U/L (ref 7–177)
Troponin I: 0.01 ng/mL (ref 0.00–0.06)
Troponin I: 0.01 ng/mL (ref 0.00–0.06)
Troponin I: 0.02 ng/mL (ref 0.00–0.06)

## 2010-05-31 LAB — HEMOCCULT GUIAC POC 1CARD (OFFICE)
Fecal Occult Bld: POSITIVE
Fecal Occult Bld: POSITIVE

## 2010-05-31 LAB — MAGNESIUM: Magnesium: 2.2 mg/dL (ref 1.5–2.5)

## 2010-06-01 LAB — POCT I-STAT 3, ART BLOOD GAS (G3+)
Acid-Base Excess: 5 mmol/L — ABNORMAL HIGH (ref 0.0–2.0)
Bicarbonate: 30 meq/L — ABNORMAL HIGH (ref 20.0–24.0)
O2 Saturation: 93 %
Patient temperature: 98.6
TCO2: 31 mmol/L (ref 0–100)
pCO2 arterial: 48.9 mmHg — ABNORMAL HIGH (ref 35.0–45.0)
pH, Arterial: 7.396 (ref 7.350–7.400)
pO2, Arterial: 67 mmHg — ABNORMAL LOW (ref 80.0–100.0)

## 2010-06-01 LAB — CBC
HCT: 25.8 % — ABNORMAL LOW (ref 36.0–46.0)
HCT: 26 % — ABNORMAL LOW (ref 36.0–46.0)
HCT: 28.3 % — ABNORMAL LOW (ref 36.0–46.0)
HCT: 36.3 % (ref 36.0–46.0)
HCT: 34.5 % — ABNORMAL LOW (ref 36.0–46.0)
Hemoglobin: 12.4 g/dL (ref 12.0–15.0)
Hemoglobin: 8.2 g/dL — ABNORMAL LOW (ref 12.0–15.0)
Hemoglobin: 8.3 g/dL — ABNORMAL LOW (ref 12.0–15.0)
Hemoglobin: 9.1 g/dL — ABNORMAL LOW (ref 12.0–15.0)
Hemoglobin: 11.2 g/dL — ABNORMAL LOW (ref 12.0–15.0)
MCH: 28 pg (ref 26.0–34.0)
MCH: 28.9 pg (ref 26.0–34.0)
MCH: 29.2 pg (ref 26.0–34.0)
MCH: 31.4 pg (ref 26.0–34.0)
MCH: 28.9 pg (ref 26.0–34.0)
MCHC: 31.8 g/dL (ref 30.0–36.0)
MCHC: 31.9 g/dL (ref 30.0–36.0)
MCHC: 32.2 g/dL (ref 30.0–36.0)
MCHC: 34.2 g/dL (ref 30.0–36.0)
MCHC: 32.5 g/dL (ref 30.0–36.0)
MCV: 88.1 fL (ref 78.0–100.0)
MCV: 90.6 fL (ref 78.0–100.0)
MCV: 90.7 fL (ref 78.0–100.0)
MCV: 91.9 fL (ref 78.0–100.0)
MCV: 89.1 fL (ref 78.0–100.0)
Platelets: 177 K/uL (ref 150–400)
Platelets: 189 10*3/uL (ref 150–400)
Platelets: 218 10*3/uL (ref 150–400)
Platelets: 98 K/uL — ABNORMAL LOW (ref 150–400)
Platelets: 256 K/uL (ref 150–400)
RBC: 2.87 MIL/uL — ABNORMAL LOW (ref 3.87–5.11)
RBC: 2.93 MIL/uL — ABNORMAL LOW (ref 3.87–5.11)
RBC: 3.12 MIL/uL — ABNORMAL LOW (ref 3.87–5.11)
RBC: 3.95 MIL/uL (ref 3.87–5.11)
RBC: 3.87 MIL/uL (ref 3.87–5.11)
RDW: 12.3 % (ref 11.5–15.5)
RDW: 14.5 % (ref 11.5–15.5)
RDW: 14.7 % (ref 11.5–15.5)
RDW: 14.9 % (ref 11.5–15.5)
RDW: 14.3 % (ref 11.5–15.5)
WBC: 10.8 K/uL — ABNORMAL HIGH (ref 4.0–10.5)
WBC: 20.5 10*3/uL — ABNORMAL HIGH (ref 4.0–10.5)
WBC: 6.6 K/uL (ref 4.0–10.5)
WBC: 7.8 10*3/uL (ref 4.0–10.5)
WBC: 8 K/uL (ref 4.0–10.5)

## 2010-06-01 LAB — DIFFERENTIAL
Basophils Absolute: 0 K/uL (ref 0.0–0.1)
Basophils Absolute: 0 10*3/uL (ref 0.0–0.1)
Basophils Relative: 0 % (ref 0–1)
Basophils Relative: 0 % (ref 0–1)
Eosinophils Absolute: 0.3 K/uL (ref 0.0–0.7)
Eosinophils Absolute: 0.4 10*3/uL (ref 0.0–0.7)
Eosinophils Relative: 2 % (ref 0–5)
Eosinophils Relative: 4 % (ref 0–5)
Lymphocytes Relative: 4 % — ABNORMAL LOW (ref 12–46)
Lymphocytes Relative: 10 % — ABNORMAL LOW (ref 12–46)
Lymphs Abs: 0.7 K/uL (ref 0.7–4.0)
Lymphs Abs: 0.8 10*3/uL (ref 0.7–4.0)
Monocytes Absolute: 1.1 K/uL — ABNORMAL HIGH (ref 0.1–1.0)
Monocytes Absolute: 0.5 10*3/uL (ref 0.1–1.0)
Monocytes Relative: 5 % (ref 3–12)
Monocytes Relative: 6 % (ref 3–12)
Neutro Abs: 18.3 K/uL — ABNORMAL HIGH (ref 1.7–7.7)
Neutro Abs: 6.3 10*3/uL (ref 1.7–7.7)
Neutrophils Relative %: 90 % — ABNORMAL HIGH (ref 43–77)
Neutrophils Relative %: 79 % — ABNORMAL HIGH (ref 43–77)

## 2010-06-01 LAB — BASIC METABOLIC PANEL WITH GFR
BUN: 23 mg/dL (ref 6–23)
CO2: 29 meq/L (ref 19–32)
Calcium: 8 mg/dL — ABNORMAL LOW (ref 8.4–10.5)
Chloride: 102 meq/L (ref 96–112)
Creatinine, Ser: 1.06 mg/dL (ref 0.4–1.2)
GFR calc non Af Amer: 52 mL/min — ABNORMAL LOW
Glucose, Bld: 95 mg/dL (ref 70–99)
Potassium: 4.8 meq/L (ref 3.5–5.1)
Sodium: 136 meq/L (ref 135–145)

## 2010-06-01 LAB — SURGICAL PCR SCREEN
MRSA, PCR: NEGATIVE
Staphylococcus aureus: NEGATIVE

## 2010-06-01 LAB — COMPREHENSIVE METABOLIC PANEL WITH GFR
ALT: 13 U/L (ref 0–35)
AST: 19 U/L (ref 0–37)
Albumin: 2.1 g/dL — ABNORMAL LOW (ref 3.5–5.2)
Alkaline Phosphatase: 59 U/L (ref 39–117)
BUN: 18 mg/dL (ref 6–23)
CO2: 30 meq/L (ref 19–32)
Calcium: 7.4 mg/dL — ABNORMAL LOW (ref 8.4–10.5)
Chloride: 104 meq/L (ref 96–112)
Creatinine, Ser: 0.94 mg/dL (ref 0.4–1.2)
GFR calc non Af Amer: 60 mL/min — ABNORMAL LOW
Glucose, Bld: 112 mg/dL — ABNORMAL HIGH (ref 70–99)
Potassium: 4.2 meq/L (ref 3.5–5.1)
Sodium: 137 meq/L (ref 135–145)
Total Bilirubin: 0.5 mg/dL (ref 0.3–1.2)
Total Protein: 4.8 g/dL — ABNORMAL LOW (ref 6.0–8.3)

## 2010-06-01 LAB — POCT I-STAT, CHEM 8
BUN: 25 mg/dL — ABNORMAL HIGH (ref 6–23)
Calcium, Ion: 1.05 mmol/L — ABNORMAL LOW (ref 1.12–1.32)
Chloride: 100 meq/L (ref 96–112)
Creatinine, Ser: 1.4 mg/dL — ABNORMAL HIGH (ref 0.4–1.2)
Glucose, Bld: 100 mg/dL — ABNORMAL HIGH (ref 70–99)
HCT: 29 % — ABNORMAL LOW (ref 36.0–46.0)
Hemoglobin: 9.9 g/dL — ABNORMAL LOW (ref 12.0–15.0)
Potassium: 4.6 meq/L (ref 3.5–5.1)
Sodium: 137 meq/L (ref 135–145)
TCO2: 29 mmol/L (ref 0–100)

## 2010-06-01 LAB — APTT: aPTT: 27 seconds (ref 24–37)

## 2010-06-01 LAB — BRAIN NATRIURETIC PEPTIDE: Pro B Natriuretic peptide (BNP): 338 pg/mL — ABNORMAL HIGH (ref 0.0–100.0)

## 2010-06-01 LAB — PROTIME-INR
INR: 1.01 (ref 0.00–1.49)
Prothrombin Time: 13.5 s (ref 11.6–15.2)

## 2010-06-01 LAB — MAGNESIUM: Magnesium: 1.9 mg/dL (ref 1.5–2.5)

## 2010-06-01 LAB — POCT CARDIAC MARKERS
CKMB, poc: 2.1 ng/mL (ref 1.0–8.0)
Myoglobin, poc: 162 ng/mL (ref 12–200)
Troponin i, poc: <0.05 ng/mL (ref 0.00–0.09)

## 2010-06-02 LAB — POCT I-STAT 3, VENOUS BLOOD GAS (G3P V)
Acid-Base Excess: 4 mmol/L — ABNORMAL HIGH (ref 0.0–2.0)
Bicarbonate: 29.6 mEq/L — ABNORMAL HIGH (ref 20.0–24.0)
O2 Saturation: 62 %
TCO2: 31 mmol/L (ref 0–100)
pCO2, Ven: 50.4 mmHg — ABNORMAL HIGH (ref 45.0–50.0)
pH, Ven: 7.377 — ABNORMAL HIGH (ref 7.250–7.300)
pO2, Ven: 33 mmHg (ref 30.0–45.0)

## 2010-06-02 LAB — POCT I-STAT 3, ART BLOOD GAS (G3+)
Acid-Base Excess: 4 mmol/L — ABNORMAL HIGH (ref 0.0–2.0)
Bicarbonate: 28.9 mEq/L — ABNORMAL HIGH (ref 20.0–24.0)
O2 Saturation: 94 %
TCO2: 30 mmol/L (ref 0–100)
pCO2 arterial: 43.9 mmHg (ref 35.0–45.0)
pH, Arterial: 7.425 — ABNORMAL HIGH (ref 7.350–7.400)
pO2, Arterial: 71 mmHg — ABNORMAL LOW (ref 80.0–100.0)

## 2010-06-03 NOTE — Consult Note (Signed)
  NAMEPAXTYN, [name]               ACCOUNT NO.:  [id number]  MEDICAL RECORD NO.:  [id number]           PATIENT TYPE:  O  LOCATION:  [facility]                         FACILITY:  [facility]  PHYSICIAN:  Hedwig Morton. Juanda Chance, MD     DATE OF BIRTH:  [birth year]  DATE OF CONSULTATION:  05/29/2010 DATE OF DISCHARGE:                                CONSULTATION   NAME OF PROCEDURE:  Removal of gastrostomy.  INDICATIONS:  This 65 year old white female has a chronic small bowel obstruction due to adhesions.  She underwent exploratory laparotomy and placement of G-tube for decompression.  She has not been able to irrigate and use the tube in last several weeks.  She is undergoing removal of the gastrostomy.  SEDATION:  None.  FINDINGS:  The gastrostomy was located in the left upper quadrant and it was a 24-French 3 cm Button gastrostomy.  The retention balloon was deflated by removing 6 mL of fluid.  The gastrostomy was then gently removed by traction.  The residual defect in the abdominal wall was cleaned and dressed.  There was no leakage when the patient was ready for discharge.  RECOMMENDATIONS:  The patient will return to regular diet.  She will have an office visit in 3 months and CBC at that time.  She will call us with any questions.  She will continue all her medications.  She will change the dressing at the gastrostomy site until the wound heals.     Hedwig Morton. Juanda Chance, MD     DMB/MEDQ  D:  05/29/2010  T:  05/29/2010  Job:  [id number]  cc:   Juanetta Gosling, MD [address] [place] [address]  Electronically Signed by [name] MD on 06/03/2010 05:09:45 PM

## 2010-06-05 ENCOUNTER — Ambulatory Visit: Payer: Medicare Other | Admitting: Internal Medicine

## 2010-06-05 ENCOUNTER — Encounter: Payer: Self-pay | Admitting: Internal Medicine

## 2010-06-06 NOTE — Progress Notes (Signed)
Summary: Triage   Phone Note Call from Patient Call back at Home Phone [phone] [facility]     Caller: Patient Call For: Dr. Juanda Chance Reason for Call: Talk to Nurse Summary of Call: Dr. Juanda Chance changed her PEG tube yesterday and it is draining bad. Has had to change the dressing several times Initial call taken by: Karna Christmas,  May 30, 2010 9:01 AM  Follow-up for Phone Call        Spoke with patient. She states that the drainage from the former PEG tube site has been a lot. She states she had to change the dressing 10-15 times yesterday. She has had to change it 3-4 times this AM. The drainage is brownish in color and is "eating up my skin." Spoke with  Mike Gip, PA and she suggested to be sure patient is on an acid blocker and that the drainge should decrease in a day or so. Spoke with patient and she states she is one Pantoprazole daily. Also, suggested trying a skin barrier on her skin. Patient understands to call back if drainage does not decrease today. Follow-up by: Jesse Fall RN,  May 30, 2010 11:12 AM  Additional Follow-up for Phone Call Additional follow up Details #1::        She should call Dr Dwain Sarna, her surgeon  to look at her PEG site.  he put it in. he may need to close it surgically. Additional Follow-up by: Hart Carwin MD,  May 30, 2010 11:37 PM    Additional Follow-up for Phone Call Additional follow up Details #2::    Spoke with patient and gave her Dr. Regino Schultze recommendations. She will call Dr. Doreen Salvage office. Follow-up by: Jesse Fall RN,  May 31, 2010 8:54 AM

## 2010-06-09 LAB — CBC
HCT: 30.1 % — ABNORMAL LOW (ref 36.0–46.0)
HCT: 31.2 % — ABNORMAL LOW (ref 36.0–46.0)
HCT: 34.2 % — ABNORMAL LOW (ref 36.0–46.0)
HCT: 34.5 % — ABNORMAL LOW (ref 36.0–46.0)
Hemoglobin: 10.1 g/dL — ABNORMAL LOW (ref 12.0–15.0)
Hemoglobin: 10.6 g/dL — ABNORMAL LOW (ref 12.0–15.0)
Hemoglobin: 11.4 g/dL — ABNORMAL LOW (ref 12.0–15.0)
Hemoglobin: 11.5 g/dL — ABNORMAL LOW (ref 12.0–15.0)
MCHC: 33.1 g/dL (ref 30.0–36.0)
MCHC: 33.6 g/dL (ref 30.0–36.0)
MCHC: 33.7 g/dL (ref 30.0–36.0)
MCHC: 33.9 g/dL (ref 30.0–36.0)
MCV: 87.8 fL (ref 78.0–100.0)
MCV: 88 fL (ref 78.0–100.0)
MCV: 88.1 fL (ref 78.0–100.0)
MCV: 88.5 fL (ref 78.0–100.0)
Platelets: 217 10*3/uL (ref 150–400)
Platelets: 222 10*3/uL (ref 150–400)
Platelets: 290 10*3/uL (ref 150–400)
Platelets: 321 10*3/uL (ref 150–400)
RBC: 3.42 MIL/uL — ABNORMAL LOW (ref 3.87–5.11)
RBC: 3.55 MIL/uL — ABNORMAL LOW (ref 3.87–5.11)
RBC: 3.87 MIL/uL (ref 3.87–5.11)
RBC: 3.92 MIL/uL (ref 3.87–5.11)
RDW: 15.8 % — ABNORMAL HIGH (ref 11.5–15.5)
RDW: 16.2 % — ABNORMAL HIGH (ref 11.5–15.5)
RDW: 16.4 % — ABNORMAL HIGH (ref 11.5–15.5)
RDW: 16.5 % — ABNORMAL HIGH (ref 11.5–15.5)
WBC: 5.4 10*3/uL (ref 4.0–10.5)
WBC: 7.7 10*3/uL (ref 4.0–10.5)
WBC: 8.8 10*3/uL (ref 4.0–10.5)
WBC: 9.9 10*3/uL (ref 4.0–10.5)

## 2010-06-09 LAB — COMPREHENSIVE METABOLIC PANEL
ALT: 12 U/L (ref 0–35)
ALT: 21 U/L (ref 0–35)
ALT: 8 U/L (ref 0–35)
AST: 12 U/L (ref 0–37)
AST: 18 U/L (ref 0–37)
AST: 23 U/L (ref 0–37)
Albumin: 1.8 g/dL — ABNORMAL LOW (ref 3.5–5.2)
Albumin: 1.9 g/dL — ABNORMAL LOW (ref 3.5–5.2)
Albumin: 2.9 g/dL — ABNORMAL LOW (ref 3.5–5.2)
Alkaline Phosphatase: 114 U/L (ref 39–117)
Alkaline Phosphatase: 63 U/L (ref 39–117)
Alkaline Phosphatase: 67 U/L (ref 39–117)
BUN: 14 mg/dL (ref 6–23)
BUN: 17 mg/dL (ref 6–23)
BUN: 5 mg/dL — ABNORMAL LOW (ref 6–23)
CO2: 26 mEq/L (ref 19–32)
CO2: 28 mEq/L (ref 19–32)
CO2: 30 mEq/L (ref 19–32)
Calcium: 7.6 mg/dL — ABNORMAL LOW (ref 8.4–10.5)
Calcium: 8 mg/dL — ABNORMAL LOW (ref 8.4–10.5)
Calcium: 8.2 mg/dL — ABNORMAL LOW (ref 8.4–10.5)
Chloride: 102 mEq/L (ref 96–112)
Chloride: 103 mEq/L (ref 96–112)
Chloride: 105 mEq/L (ref 96–112)
Creatinine, Ser: 0.42 mg/dL (ref 0.4–1.2)
Creatinine, Ser: 0.48 mg/dL (ref 0.4–1.2)
Creatinine, Ser: 0.58 mg/dL (ref 0.4–1.2)
GFR calc Af Amer: >60 mL/min (ref >60)
GFR calc Af Amer: >60 mL/min (ref >60)
GFR calc Af Amer: >60 mL/min (ref >60)
GFR calc non Af Amer: >60 mL/min (ref >60)
GFR calc non Af Amer: >60 mL/min (ref >60)
GFR calc non Af Amer: >60 mL/min (ref >60)
Glucose, Bld: 102 mg/dL — ABNORMAL HIGH (ref 70–99)
Glucose, Bld: 126 mg/dL — ABNORMAL HIGH (ref 70–99)
Glucose, Bld: 75 mg/dL (ref 70–99)
Potassium: 3.5 mEq/L (ref 3.5–5.1)
Potassium: 3.6 mEq/L (ref 3.5–5.1)
Potassium: 4.1 mEq/L (ref 3.5–5.1)
Sodium: 134 mEq/L — ABNORMAL LOW (ref 135–145)
Sodium: 135 mEq/L (ref 135–145)
Sodium: 138 mEq/L (ref 135–145)
Total Bilirubin: 0.1 mg/dL — ABNORMAL LOW (ref 0.3–1.2)
Total Bilirubin: 0.4 mg/dL (ref 0.3–1.2)
Total Bilirubin: 0.4 mg/dL (ref 0.3–1.2)
Total Protein: 4.2 g/dL — ABNORMAL LOW (ref 6.0–8.3)
Total Protein: 4.5 g/dL — ABNORMAL LOW (ref 6.0–8.3)
Total Protein: 5.5 g/dL — ABNORMAL LOW (ref 6.0–8.3)

## 2010-06-09 LAB — BASIC METABOLIC PANEL
BUN: 10 mg/dL (ref 6–23)
BUN: 11 mg/dL (ref 6–23)
BUN: 11 mg/dL (ref 6–23)
BUN: 17 mg/dL (ref 6–23)
BUN: 7 mg/dL (ref 6–23)
CO2: 24 mEq/L (ref 19–32)
CO2: 25 mEq/L (ref 19–32)
CO2: 28 mEq/L (ref 19–32)
CO2: 29 mEq/L (ref 19–32)
CO2: 30 mEq/L (ref 19–32)
Calcium: 7.3 mg/dL — ABNORMAL LOW (ref 8.4–10.5)
Calcium: 7.8 mg/dL — ABNORMAL LOW (ref 8.4–10.5)
Calcium: 7.8 mg/dL — ABNORMAL LOW (ref 8.4–10.5)
Calcium: 7.9 mg/dL — ABNORMAL LOW (ref 8.4–10.5)
Calcium: 8.4 mg/dL (ref 8.4–10.5)
Chloride: 102 mEq/L (ref 96–112)
Chloride: 103 mEq/L (ref 96–112)
Chloride: 105 mEq/L (ref 96–112)
Chloride: 105 mEq/L (ref 96–112)
Chloride: 105 mEq/L (ref 96–112)
Creatinine, Ser: 0.4 mg/dL (ref 0.4–1.2)
Creatinine, Ser: 0.41 mg/dL (ref 0.4–1.2)
Creatinine, Ser: 0.5 mg/dL (ref 0.4–1.2)
Creatinine, Ser: 0.53 mg/dL (ref 0.4–1.2)
Creatinine, Ser: 0.57 mg/dL (ref 0.4–1.2)
GFR calc Af Amer: >60 mL/min (ref >60)
GFR calc Af Amer: >60 mL/min (ref >60)
GFR calc Af Amer: >60 mL/min (ref >60)
GFR calc Af Amer: >60 mL/min (ref >60)
GFR calc Af Amer: >60 mL/min (ref >60)
GFR calc non Af Amer: >60 mL/min (ref >60)
GFR calc non Af Amer: >60 mL/min (ref >60)
GFR calc non Af Amer: >60 mL/min (ref >60)
GFR calc non Af Amer: >60 mL/min (ref >60)
GFR calc non Af Amer: >60 mL/min (ref >60)
Glucose, Bld: 102 mg/dL — ABNORMAL HIGH (ref 70–99)
Glucose, Bld: 113 mg/dL — ABNORMAL HIGH (ref 70–99)
Glucose, Bld: 132 mg/dL — ABNORMAL HIGH (ref 70–99)
Glucose, Bld: 97 mg/dL (ref 70–99)
Glucose, Bld: 98 mg/dL (ref 70–99)
Potassium: 3.8 mEq/L (ref 3.5–5.1)
Potassium: 3.9 mEq/L (ref 3.5–5.1)
Potassium: 4.3 mEq/L (ref 3.5–5.1)
Potassium: 4.4 mEq/L (ref 3.5–5.1)
Potassium: 4.7 mEq/L (ref 3.5–5.1)
Sodium: 132 mEq/L — ABNORMAL LOW (ref 135–145)
Sodium: 135 mEq/L (ref 135–145)
Sodium: 136 mEq/L (ref 135–145)
Sodium: 138 mEq/L (ref 135–145)
Sodium: 138 mEq/L (ref 135–145)

## 2010-06-09 LAB — DIFFERENTIAL
Basophils Absolute: 0 10*3/uL (ref 0.0–0.1)
Basophils Absolute: 0 10*3/uL (ref 0.0–0.1)
Basophils Relative: 0 % (ref 0–1)
Basophils Relative: 1 % (ref 0–1)
Eosinophils Absolute: 0.1 10*3/uL (ref 0.0–0.7)
Eosinophils Absolute: 0.1 10*3/uL (ref 0.0–0.7)
Eosinophils Relative: 1 % (ref 0–5)
Eosinophils Relative: 1 % (ref 0–5)
Lymphocytes Relative: 6 % — ABNORMAL LOW (ref 12–46)
Lymphocytes Relative: 9 % — ABNORMAL LOW (ref 12–46)
Lymphs Abs: 0.4 10*3/uL — ABNORMAL LOW (ref 0.7–4.0)
Lymphs Abs: 0.8 10*3/uL (ref 0.7–4.0)
Monocytes Absolute: 0.4 10*3/uL (ref 0.1–1.0)
Monocytes Absolute: 0.5 10*3/uL (ref 0.1–1.0)
Monocytes Relative: 5 % (ref 3–12)
Monocytes Relative: 7 % (ref 3–12)
Neutro Abs: 6.6 10*3/uL (ref 1.7–7.7)
Neutro Abs: 7.5 10*3/uL (ref 1.7–7.7)
Neutrophils Relative %: 85 % — ABNORMAL HIGH (ref 43–77)
Neutrophils Relative %: 86 % — ABNORMAL HIGH (ref 43–77)

## 2010-06-09 LAB — GLUCOSE, CAPILLARY
Glucose-Capillary: 101 mg/dL — ABNORMAL HIGH (ref 70–99)
Glucose-Capillary: 105 mg/dL — ABNORMAL HIGH (ref 70–99)
Glucose-Capillary: 106 mg/dL — ABNORMAL HIGH (ref 70–99)
Glucose-Capillary: 107 mg/dL — ABNORMAL HIGH (ref 70–99)
Glucose-Capillary: 108 mg/dL — ABNORMAL HIGH (ref 70–99)
Glucose-Capillary: 111 mg/dL — ABNORMAL HIGH (ref 70–99)
Glucose-Capillary: 117 mg/dL — ABNORMAL HIGH (ref 70–99)
Glucose-Capillary: 118 mg/dL — ABNORMAL HIGH (ref 70–99)
Glucose-Capillary: 118 mg/dL — ABNORMAL HIGH (ref 70–99)
Glucose-Capillary: 119 mg/dL — ABNORMAL HIGH (ref 70–99)
Glucose-Capillary: 121 mg/dL — ABNORMAL HIGH (ref 70–99)
Glucose-Capillary: 124 mg/dL — ABNORMAL HIGH (ref 70–99)
Glucose-Capillary: 128 mg/dL — ABNORMAL HIGH (ref 70–99)
Glucose-Capillary: 133 mg/dL — ABNORMAL HIGH (ref 70–99)
Glucose-Capillary: 76 mg/dL (ref 70–99)
Glucose-Capillary: 96 mg/dL (ref 70–99)

## 2010-06-09 LAB — TYPE AND SCREEN
ABO/RH(D): A NEG
Antibody Screen: NEGATIVE

## 2010-06-09 LAB — PHOSPHORUS
Phosphorus: 2.8 mg/dL (ref 2.3–4.6)
Phosphorus: 3.4 mg/dL (ref 2.3–4.6)
Phosphorus: 4.5 mg/dL (ref 2.3–4.6)
Phosphorus: 5 mg/dL — ABNORMAL HIGH (ref 2.3–4.6)

## 2010-06-09 LAB — TRIGLYCERIDES: Triglycerides: 142 mg/dL (ref <150)

## 2010-06-09 LAB — PROTIME-INR
INR: 1.14 (ref 0.00–1.49)
Prothrombin Time: 14.5 seconds (ref 11.6–15.2)

## 2010-06-09 LAB — CHOLESTEROL, TOTAL: Cholesterol: 130 mg/dL (ref 0–200)

## 2010-06-09 LAB — MAGNESIUM
Magnesium: 1.9 mg/dL (ref 1.5–2.5)
Magnesium: 1.9 mg/dL (ref 1.5–2.5)
Magnesium: 2 mg/dL (ref 1.5–2.5)

## 2010-06-09 LAB — APTT: aPTT: 32 seconds (ref 24–37)

## 2010-06-09 LAB — POCT I-STAT 4, (NA,K, GLUC, HGB,HCT)
Glucose, Bld: 118 mg/dL — ABNORMAL HIGH (ref 70–99)
HCT: 24 % — ABNORMAL LOW (ref 36.0–46.0)
Hemoglobin: 8.2 g/dL — ABNORMAL LOW (ref 12.0–15.0)
Potassium: 4.2 mEq/L (ref 3.5–5.1)
Sodium: 138 mEq/L (ref 135–145)

## 2010-06-09 LAB — ABO/RH: ABO/RH(D): A NEG

## 2010-06-09 LAB — MRSA PCR SCREENING: MRSA by PCR: NEGATIVE

## 2010-06-09 LAB — PREALBUMIN: Prealbumin: 7.1 mg/dL — ABNORMAL LOW (ref 18.0–45.0)

## 2010-06-14 ENCOUNTER — Other Ambulatory Visit: Payer: Self-pay | Admitting: Internal Medicine

## 2010-06-14 ENCOUNTER — Ambulatory Visit (INDEPENDENT_AMBULATORY_CARE_PROVIDER_SITE_OTHER): Payer: Medicare Other | Admitting: Internal Medicine

## 2010-06-14 ENCOUNTER — Other Ambulatory Visit (HOSPITAL_COMMUNITY): Payer: Self-pay | Admitting: General Surgery

## 2010-06-14 DIAGNOSIS — K56609 Unspecified intestinal obstruction, unspecified as to partial versus complete obstruction: Secondary | ICD-10-CM

## 2010-06-14 DIAGNOSIS — E538 Deficiency of other specified B group vitamins: Secondary | ICD-10-CM

## 2010-06-14 MED ORDER — CYANOCOBALAMIN 1000 MCG/ML IJ SOLN
1000.0000 ug | INTRAMUSCULAR | Status: AC
  Administered 2010-06-14 – 2010-11-21 (×5): 1000 ug via INTRAMUSCULAR

## 2010-06-16 ENCOUNTER — Other Ambulatory Visit: Payer: Self-pay | Admitting: Family Medicine

## 2010-06-16 ENCOUNTER — Ambulatory Visit (HOSPITAL_COMMUNITY)
Admission: RE | Admit: 2010-06-16 | Discharge: 2010-06-16 | Disposition: A | Discharge status: Home / Self Care | Payer: Medicare Other | Source: Ambulatory Visit | Attending: General Surgery | Admitting: General Surgery

## 2010-06-16 ENCOUNTER — Encounter: Payer: Self-pay | Admitting: Internal Medicine

## 2010-06-16 DIAGNOSIS — Z431 Encounter for attention to gastrostomy: Secondary | ICD-10-CM | POA: Insufficient documentation

## 2010-06-16 DIAGNOSIS — K56609 Unspecified intestinal obstruction, unspecified as to partial versus complete obstruction: Secondary | ICD-10-CM

## 2010-06-16 MED ORDER — IOHEXOL 300 MG/ML  SOLN
50.0000 mL | Freq: Once | INTRAMUSCULAR | Status: AC | PRN
  Administered 2010-06-16: 10 mL

## 2010-06-19 MED ORDER — TEMAZEPAM 15 MG PO CAPS: 15.0000 mg | ORAL_CAPSULE | Freq: Every evening | ORAL | Status: DC | PRN

## 2010-06-19 MED ORDER — IOHEXOL 300 MG/ML  SOLN
50.0000 mL | Freq: Once | INTRAMUSCULAR | Status: AC | PRN
  Administered 2010-06-19: 10 mL via INTRAVENOUS

## 2010-06-19 NOTE — Telephone Encounter (Signed)
Last seen 05/19/10 and filled 03/21/10 please advise     KP

## 2010-06-19 NOTE — Telephone Encounter (Signed)
Rx faxed to pharmacy.      KP 

## 2010-06-20 ENCOUNTER — Telehealth: Payer: Self-pay | Admitting: Family Medicine

## 2010-06-20 ENCOUNTER — Encounter: Payer: Self-pay | Admitting: Cardiology

## 2010-06-20 ENCOUNTER — Ambulatory Visit (INDEPENDENT_AMBULATORY_CARE_PROVIDER_SITE_OTHER): Payer: Medicare Other | Admitting: Cardiology

## 2010-06-20 DIAGNOSIS — I10 Essential (primary) hypertension: Secondary | ICD-10-CM

## 2010-06-20 DIAGNOSIS — I5022 Chronic systolic (congestive) heart failure: Secondary | ICD-10-CM

## 2010-06-20 DIAGNOSIS — K3184 Gastroparesis: Secondary | ICD-10-CM

## 2010-06-20 MED ORDER — FENTANYL 50 MCG/HR TD PT72: 1.0000 | MEDICATED_PATCH | TRANSDERMAL | Status: DC

## 2010-06-20 MED ORDER — OXYCODONE-ACETAMINOPHEN 10-325 MG PO TABS: 1.0000 | ORAL_TABLET | ORAL | Status: DC | PRN

## 2010-06-20 NOTE — Telephone Encounter (Signed)
Last filled 05/17/10 and seen 05/19/10 please advise     KP

## 2010-06-20 NOTE — Telephone Encounter (Signed)
OK TO REFILL X1

## 2010-06-20 NOTE — Assessment & Plan Note (Signed)
Recurrent gastroparesis has been a major issue. She's generally been treated with IV fluids that she oftentimes will administer at home. This has been a successful way of dealing with a very complicated problem.

## 2010-06-20 NOTE — Progress Notes (Signed)
Subjective:   [name] is seen today in followup. She is a very complex medical history including a nonischemic cardiomyopathy chronic left bundle branch block. 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 She has some mild residual left arm edema. He's had mild coronary atherosclerosis by catheterization in January 2008. She's had a history of previous atrial flutter has been on amiodarone she does have a chronic left bundle branch block. She had a GI bleed on Pradaxa that was quite significant. She currently is waiting for revision of a PEG tube drainage site by Dr. Dwain Sarna. She has followup with Dr. Graciela Husbands for her body pacemaker. Overall, she's doing reasonably well.  Her other problems include COPD, hypertension, recurrent small bowel obstruction treated with IV fluids by way of a Port-A-Cath, chronic pain, fibromyalgia, a history of recurrent infections.  Current Outpatient Prescriptions  Medication Sig Dispense Refill  . albuterol (VENTOLIN HFA) 108 (90 BASE) MCG/ACT inhaler Inhale 2 puffs into the lungs every 6 (six) hours as needed.        [name] amiodarone (PACERONE) 200 MG tablet Take 200 mg by mouth daily.        . carvedilol (COREG) 6.25 MG tablet Take 6.25 mg by mouth 2 (two) times daily with a meal.        . ciprofloxacin (CIPRO) 500 MG tablet Take 500 mg by mouth 2 (two) times daily.        . cyclobenzaprine (FLEXERIL) 10 MG tablet Take 10 mg by mouth 3 (three) times daily as needed.        [name] denosumab (PROLIA) 60 MG/ML SOLN Inject 60 mg into the skin once.        . furosemide (LASIX) 20 MG tablet Take 20 mg by mouth daily.        . Gabapentin, PHN, 600 MG TABS 4 (four) times daily.        . meloxicam (MOBIC) 15 MG tablet Take 15 mg by mouth as needed.        . metoCLOPramide (REGLAN) 5 MG tablet Take 5 mg by mouth 3 (three) times daily.        . nitroGLYCERIN (NITROSTAT) 0.4 MG SL tablet Place 0.4 mg under the tongue every 5 (five)  minutes as needed.        . Nutritional Supplements (RESOURCE BREEZE PO) 2 (two) times daily.       . ondansetron (ZOFRAN) 8 MG tablet Take by mouth every 8 (eight) hours as needed.        . pantoprazole (PROTONIX) 40 MG tablet Take 1 tablet by mouth once daily  90 tablet  0  . temazepam (RESTORIL) 15 MG capsule Take 1 capsule (15 mg total) by mouth at bedtime as needed.  30 capsule  0  . tiotropium (SPIRIVA HANDIHALER) 18 MCG inhalation capsule Place 18 mcg into inhaler and inhale daily.        [name] DISCONTD: fentaNYL (DURAGESIC - DOSED MCG/HR) 50 MCG/HR Place 1 patch onto the skin every 3 (three) days.        [name] DISCONTD: oxyCODONE-acetaminophen (ENDOCET) 10-325 MG per tablet Take 1 tablet by mouth every 4 (four) hours as needed.        . fentaNYL (DURAGESIC - DOSED MCG/HR) 50 MCG/HR Place 1 patch (50 mcg total) onto the skin every 3 (three) days.  10 patch  0  . magnesium oxide (MAG-OX) 400 MG tablet Take 400 mg by mouth  2 (two) times daily.        [name] oxyCODONE-acetaminophen (ENDOCET) 10-325 MG per tablet Take 1 tablet by mouth every 4 (four) hours as needed.  120 tablet  0   Current Facility-Administered Medications  Medication Dose Route Frequency Provider Last Rate Last Dose  . cyanocobalamin ((VITAMIN B-12)) injection 1,000 mcg  1,000 mcg Intramuscular Q30 days Hart Carwin, MD   1,000 mcg at 06/14/10 1100    Allergies  Allergen Reactions  . Pentazocine Lactate   . Sulfonamide Derivatives     Patient Active Problem List  Diagnoses  . B12 DEFICIENCY  . DEHYDRATION  . ANEMIA-IRON DEFICIENCY  . RESTLESS LEG SYNDROME  . Chronic Pain Syndrome  . HYPERTENSION, BENIGN ESSENTIAL  . HYPERTENSION  . CARDIOMYOPATHY, PRIMARY, DILATED  . LBBB  . ATRIAL FIBRILLATION  . ATRIAL FLUTTER  . SYSTOLIC HEART FAILURE, CHRONIC  . COPD  . GERD  . GASTRITIS  . GASTROPARESIS  . GASTROENTERITIS  . DIVERTICULOSIS, COLON  . OSTEOARTHRITIS  . DEGENERATIVE DISC DISEASE  . FIBROMYALGIA  .  OSTEOPOROSIS  . SYNCOPE  . CHRONIC FATIGUE SYNDROME  . SNORING  . NAUSEA, CHRONIC  . URINARY INCONTINENCE  . ABDOMINAL PAIN, CHRONIC  . ABDOMINAL PAIN-LLQ  . ABDOMINAL PAIN-PERIUMBILICAL  . NONSPECIFIC ABN FINDNG RAD&OTH EXAM BILARY TRCT  . HYPOXEMIA  . DYSURIA  . UNSPECIFIED INTESTINAL OBSTRUCTION    History  Smoking status  . Former Smoker  Smokeless tobacco  . Not on file    History  Alcohol Use No    Family History  Problem Relation Age of Onset  . Heart disease Father     Review of Systems:   The patient denies any heat or cold intolerance.  No weight gain or weight loss.  The patient denies headaches or blurry vision.  There is no cough or sputum production.  The patient denies dizziness.  There is no hematuria or hematochezia.  The patient denies any muscle aches or arthritis.  The patient denies any rash.  The patient denies frequent falling or instability.  There is no history of depression or anxiety.  All other systems were reviewed and are negative.   Physical Exam:   Weight is 98. Blood pressure is 118/72 heart rates 72 and regular. She is pleasant but somewhat sallow appearance. She's in no acute distress and overall she's in a good mood and cheerful. She is a pacemaker in the left upper chest Port-A-Cath in the right upper chest. She has a bandaged area over the previous PEG tube was present. She is normocephalic and atraumatic. Neck is supple without bruits. Lungs are clear. Heart shows a regular rate and rhythm. Extremities show only trace edema neurologically she's intact Assessment / Plan:

## 2010-06-20 NOTE — Telephone Encounter (Signed)
Pt aware Rx ready for pick up.    KP 

## 2010-06-20 NOTE — Telephone Encounter (Signed)
Patient called to request refill prescriptions for 1)  Oxycodone-Acet  10/325 and 2) fentanyl 50 pain patches---please call her when prescriptions are ready for pickup

## 2010-06-20 NOTE — Assessment & Plan Note (Signed)
Blood pressures been well-controlled. She's currently on carvedilol furosemide.

## 2010-06-20 NOTE — Assessment & Plan Note (Signed)
Overall, she continues to do reasonably well. Her body pacemaker has improved LV function in general although she's had difficulties getting adjusted to it originally. She does have chronic amiodarone therapy because a history of atrial flutter. In general, her cardiomyopathy has been reasonably stable. She did have a significant bleed with Pradaxa has not been on any anticoagulation currently.

## 2010-06-24 LAB — POCT I-STAT 4, (NA,K, GLUC, HGB,HCT)
Glucose, Bld: 90 mg/dL (ref 70–99)
HCT: 32 % — ABNORMAL LOW (ref 36.0–46.0)
Hemoglobin: 10.9 g/dL — ABNORMAL LOW (ref 12.0–15.0)
Potassium: 4.4 mEq/L (ref 3.5–5.1)
Sodium: 137 mEq/L (ref 135–145)

## 2010-06-26 ENCOUNTER — Encounter: Payer: Self-pay | Admitting: Internal Medicine

## 2010-06-26 ENCOUNTER — Other Ambulatory Visit: Payer: Self-pay

## 2010-06-26 ENCOUNTER — Ambulatory Visit (INDEPENDENT_AMBULATORY_CARE_PROVIDER_SITE_OTHER): Payer: Medicare Other | Admitting: Internal Medicine

## 2010-06-26 VITALS — BP 119/71 | HR 74 | Ht 62.0 in | Wt 98.0 lb

## 2010-06-26 DIAGNOSIS — R55 Syncope and collapse: Secondary | ICD-10-CM

## 2010-06-26 DIAGNOSIS — I82629 Acute embolism and thrombosis of deep veins of unspecified upper extremity: Secondary | ICD-10-CM | POA: Insufficient documentation

## 2010-06-26 DIAGNOSIS — R0989 Other specified symptoms and signs involving the circulatory and respiratory systems: Secondary | ICD-10-CM

## 2010-06-26 DIAGNOSIS — I4891 Unspecified atrial fibrillation: Secondary | ICD-10-CM

## 2010-06-26 DIAGNOSIS — Z950 Presence of cardiac pacemaker: Secondary | ICD-10-CM | POA: Insufficient documentation

## 2010-06-26 DIAGNOSIS — Z95810 Presence of automatic (implantable) cardiac defibrillator: Secondary | ICD-10-CM

## 2010-06-26 DIAGNOSIS — R0609 Other forms of dyspnea: Secondary | ICD-10-CM

## 2010-06-26 DIAGNOSIS — I428 Other cardiomyopathies: Secondary | ICD-10-CM

## 2010-06-26 DIAGNOSIS — I10 Essential (primary) hypertension: Secondary | ICD-10-CM

## 2010-06-26 DIAGNOSIS — I5022 Chronic systolic (congestive) heart failure: Secondary | ICD-10-CM

## 2010-06-26 LAB — BASIC METABOLIC PANEL
BUN: 19 mg/dL (ref 6–23)
CO2: 31 mEq/L (ref 19–32)
Calcium: 6.8 mg/dL — ABNORMAL LOW (ref 8.4–10.5)
Chloride: 99 mEq/L (ref 96–112)
Creatinine, Ser: 1 mg/dL (ref 0.4–1.2)
GFR: 62.63 mL/min (ref >60.00)
Glucose, Bld: 69 mg/dL — ABNORMAL LOW (ref 70–99)
Potassium: 4.3 mEq/L (ref 3.5–5.1)
Sodium: 138 mEq/L (ref 135–145)

## 2010-06-26 LAB — BRAIN NATRIURETIC PEPTIDE: Pro B Natriuretic peptide (BNP): 70.8 pg/mL (ref 0.0–100.0)

## 2010-06-26 LAB — TSH: TSH: 4.43 u[IU]/mL (ref 0.35–5.50)

## 2010-06-26 MED ORDER — FUROSEMIDE 20 MG PO TABS: ORAL_TABLET | ORAL | Status: DC

## 2010-06-26 NOTE — Progress Notes (Signed)
HPI  [name] is a 65 y.o. female Is seen in followup for congestive heart failure in the setting of nonischemic cardiomyopathy. She underwent CRT-D implantation October 2011. This has been associated with significant improvement although complicated by left upper extremity deep venous thrombosis.  The patient denies chest pain, shortness of breath, nocturnal dyspnea, orthopnea or peripheral edema.  There have been no palpitations, lightheadedness or syncope.    She also has a history of atrial arrhythmias for which he was treated with amiodarone.    She recently underwent revision again of her gastrostomy that had been implanted because of significant GI fluid losses   Past Medical History  Diagnosis Date  . Small bowel obstruction   . Anemia   . HTN (hypertension)   . Osteoarthritis   . GERD (gastroesophageal reflux disease)   . CAD (coronary artery disease)   . LBBB (left bundle branch block)   . Atrial fibrillation     Dr. Roger Shelter  . 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 Fibromyalgia   . Diverticular disease   . COPD (chronic obstructive pulmonary disease)     -06/28/08 PFT FEV1 1.95 (97%), FVC 2.79 (101%), FEV1% 70, TLC 3.88 (87%), DLCO 64%  . Pneumonia - 03/09, 12/09, 02/10, 06/10  . Pleural effusion      s/p right thoracentesis 03/09    Past Surgical History  Procedure Date  . Colectomy     Intestinal resection (5times),S/P PARTIAL COLECTOMY 2001  . Appendectomy   . Bladder surgery   . Tonsillectomy   . Picc line   . Total abdominal hysterectomy   . Pacemaker placement     PPM-Medtronic    Current Outpatient Prescriptions  Medication Sig Dispense Refill  . albuterol (VENTOLIN HFA) 108 (90 BASE) MCG/ACT inhaler Inhale 2 puffs into the lungs every 6 (six) hours as needed.        [name] amiodarone (PACERONE) 200 MG tablet Take 200 mg by mouth daily.        . carvedilol (COREG) 6.25 MG tablet Take 6.25 mg by mouth  2 (two) times daily with a meal.        . cyclobenzaprine (FLEXERIL) 10 MG tablet Take 10 mg by mouth 3 (three) times daily as needed.        [name] denosumab (PROLIA) 60 MG/ML SOLN Inject 60 mg into the skin once.        . fentaNYL (DURAGESIC - DOSED MCG/HR) 50 MCG/HR Place 1 patch (50 mcg total) onto the skin every 3 (three) days.  10 patch  0  . furosemide (LASIX) 20 MG tablet Take 20 mg by mouth daily.        . Gabapentin, PHN, 600 MG TABS 4 (four) times daily.        . magnesium oxide (MAG-OX) 400 MG tablet Take 400 mg by mouth 2 (two) times daily.        . meloxicam (MOBIC) 15 MG tablet Take 15 mg by mouth as needed.        . metoCLOPramide (REGLAN) 5 MG tablet Take 5 mg by mouth 3 (three) times daily.        . nitroGLYCERIN (NITROSTAT) 0.4 MG SL tablet Place 0.4 mg under the tongue every 5 (five) minutes as needed.        . Nutritional Supplements (RESOURCE BREEZE PO) 2 (two) times daily.       . ondansetron (ZOFRAN) 8 MG tablet  Take by mouth every 8 (eight) hours as needed.        [name] oxyCODONE-acetaminophen (ENDOCET) 10-325 MG per tablet Take 1 tablet by mouth every 4 (four) hours as needed.  120 tablet  0  . pantoprazole (PROTONIX) 40 MG tablet Take 1 tablet by mouth once daily  90 tablet  0  . temazepam (RESTORIL) 15 MG capsule Take 1 capsule (15 mg total) by mouth at bedtime as needed.  30 capsule  0  . tiotropium (SPIRIVA HANDIHALER) 18 MCG inhalation capsule Place 18 mcg into inhaler and inhale daily.        [name] DISCONTD: ciprofloxacin (CIPRO) 500 MG tablet Take 500 mg by mouth 2 (two) times daily.         Current Facility-Administered Medications  Medication Dose Route Frequency Provider Last Rate Last Dose  . cyanocobalamin ((VITAMIN B-12)) injection 1,000 mcg  1,000 mcg Intramuscular Q30 days Hart Carwin, MD   1,000 mcg at 06/14/10 1100    Allergies  Allergen Reactions  . Pentazocine Lactate   . Pradaxa   . Sulfonamide Derivatives   . Talwin     Review of Systems negative  except from HPI and PMH  Physical Exam Well developed and well nourished in no acute distress HENT normal E scleral and icterus clear Neck Supple JVP 8.-9 cm with what appears to be a V wave Clear to ausculation Regular rate and rhythm, no murmurs gallops or rub Soft with active bowel sounds No clubbing cyanosis; some swelling of the left upper extremityAlert and oriented, grossly normal motor and sensory function Skin Warm and Dry    Assessment and  Plan

## 2010-06-26 NOTE — Patient Instructions (Signed)
Your physician has recommended you make the following change in your medication: Take 40mg  of Furosemide daily alternating with 80mg  of Furosemide. Remote monitoring is used to monitor your Pacemaker of ICD from home.You are scheduled for a device check from home on 07-10-10. You may send your transmission at any time that day. Your physician wants you to follow-up in: October 2012 with Dr Graciela Husbands. You will receive a reminder letter in the mail two months in advance. If you don't receive a letter, please call our office to schedule the follow-up appointment. Your physician recommends that you return for lab work today.

## 2010-06-26 NOTE — Assessment & Plan Note (Signed)
Stable on current medications although she is not on ACE inhibitors probably because of hyperkalemia or spironolactone for the same reason

## 2010-06-26 NOTE — Assessment & Plan Note (Signed)
She is relatively stable. Her optivol index however is initially elevated. Check a BNP and then increase her Lasix from 40 daily to 40-80. We will recheck her optivol index in 2 weeks via [facility]

## 2010-06-26 NOTE — Assessment & Plan Note (Signed)
The patient's device was interrogated.  The information was reviewed. No changes were made in the programming.    

## 2010-06-26 NOTE — Assessment & Plan Note (Signed)
No Recurrent syncope 

## 2010-06-26 NOTE — Assessment & Plan Note (Signed)
No intercurrent atrial fibrillation by monitor. We will check her amiodarone surveillance laboratories with a TSH. LFTs from the hospital one month ago normal

## 2010-06-26 NOTE — Assessment & Plan Note (Signed)
Stable with gradual improvement.

## 2010-07-07 ENCOUNTER — Other Ambulatory Visit: Payer: Self-pay | Admitting: General Surgery

## 2010-07-07 ENCOUNTER — Encounter (HOSPITAL_COMMUNITY): Payer: Medicare Other

## 2010-07-07 ENCOUNTER — Telehealth: Payer: Self-pay | Admitting: Internal Medicine

## 2010-07-07 DIAGNOSIS — J4489 Other specified chronic obstructive pulmonary disease: Secondary | ICD-10-CM | POA: Insufficient documentation

## 2010-07-07 DIAGNOSIS — Z8673 Personal history of transient ischemic attack (TIA), and cerebral infarction without residual deficits: Secondary | ICD-10-CM | POA: Insufficient documentation

## 2010-07-07 DIAGNOSIS — Z01812 Encounter for preprocedural laboratory examination: Secondary | ICD-10-CM | POA: Insufficient documentation

## 2010-07-07 DIAGNOSIS — Z79899 Other long term (current) drug therapy: Secondary | ICD-10-CM | POA: Insufficient documentation

## 2010-07-07 DIAGNOSIS — J449 Chronic obstructive pulmonary disease, unspecified: Secondary | ICD-10-CM | POA: Insufficient documentation

## 2010-07-07 DIAGNOSIS — K316 Fistula of stomach and duodenum: Secondary | ICD-10-CM | POA: Insufficient documentation

## 2010-07-07 DIAGNOSIS — I10 Essential (primary) hypertension: Secondary | ICD-10-CM | POA: Insufficient documentation

## 2010-07-07 DIAGNOSIS — D649 Anemia, unspecified: Secondary | ICD-10-CM | POA: Insufficient documentation

## 2010-07-07 LAB — CBC
HCT: 36.1 % (ref 36.0–46.0)
Hemoglobin: 11 g/dL — ABNORMAL LOW (ref 12.0–15.0)
MCH: 26.8 pg (ref 26.0–34.0)
MCHC: 30.5 g/dL (ref 30.0–36.0)
MCV: 88 fL (ref 78.0–100.0)
Platelets: 380 10*3/uL (ref 150–400)
RBC: 4.1 MIL/uL (ref 3.87–5.11)
RDW: 13.5 % (ref 11.5–15.5)
WBC: 12.8 10*3/uL — ABNORMAL HIGH (ref 4.0–10.5)

## 2010-07-07 LAB — DIFFERENTIAL
Basophils Absolute: 0 10*3/uL (ref 0.0–0.1)
Basophils Relative: 0 % (ref 0–1)
Eosinophils Absolute: 0.1 10*3/uL (ref 0.0–0.7)
Eosinophils Relative: 1 % (ref 0–5)
Lymphocytes Relative: 6 % — ABNORMAL LOW (ref 12–46)
Lymphs Abs: 0.8 10*3/uL (ref 0.7–4.0)
Monocytes Absolute: 0.6 10*3/uL (ref 0.1–1.0)
Monocytes Relative: 5 % (ref 3–12)
Neutro Abs: 11.3 10*3/uL — ABNORMAL HIGH (ref 1.7–7.7)
Neutrophils Relative %: 88 % — ABNORMAL HIGH (ref 43–77)

## 2010-07-07 LAB — COMPREHENSIVE METABOLIC PANEL
ALT: 15 U/L (ref 0–35)
AST: 20 U/L (ref 0–37)
Albumin: 2.4 g/dL — ABNORMAL LOW (ref 3.5–5.2)
Alkaline Phosphatase: 84 U/L (ref 39–117)
BUN: 17 mg/dL (ref 6–23)
CO2: 32 mEq/L (ref 19–32)
Calcium: 7.7 mg/dL — ABNORMAL LOW (ref 8.4–10.5)
Chloride: 103 mEq/L (ref 96–112)
Creatinine, Ser: 0.89 mg/dL (ref 0.4–1.2)
GFR calc Af Amer: >60 mL/min (ref >60)
GFR calc non Af Amer: >60 mL/min (ref >60)
Glucose, Bld: 64 mg/dL — ABNORMAL LOW (ref 70–99)
Potassium: 3.9 mEq/L (ref 3.5–5.1)
Sodium: 142 mEq/L (ref 135–145)
Total Bilirubin: 0.4 mg/dL (ref 0.3–1.2)
Total Protein: 5.7 g/dL — ABNORMAL LOW (ref 6.0–8.3)

## 2010-07-07 LAB — SURGICAL PCR SCREEN
MRSA, PCR: NEGATIVE
Staphylococcus aureus: POSITIVE — AB

## 2010-07-07 NOTE — Telephone Encounter (Signed)
Faxed OV [facility] Labs to [facility] ([phone]).

## 2010-07-10 ENCOUNTER — Ambulatory Visit (INDEPENDENT_AMBULATORY_CARE_PROVIDER_SITE_OTHER): Payer: Medicare Other | Admitting: *Deleted

## 2010-07-10 ENCOUNTER — Encounter: Payer: Medicare Other | Admitting: *Deleted

## 2010-07-10 DIAGNOSIS — I5022 Chronic systolic (congestive) heart failure: Secondary | ICD-10-CM

## 2010-07-10 DIAGNOSIS — I459 Conduction disorder, unspecified: Secondary | ICD-10-CM

## 2010-07-10 NOTE — Progress Notes (Signed)
Icd/icm recheck

## 2010-07-11 ENCOUNTER — Telehealth: Payer: Self-pay | Admitting: Internal Medicine

## 2010-07-13 ENCOUNTER — Other Ambulatory Visit: Payer: Self-pay | Admitting: General Surgery

## 2010-07-13 ENCOUNTER — Ambulatory Visit (HOSPITAL_COMMUNITY)
Admission: RE | Admit: 2010-07-13 | Discharge: 2010-07-14 | Disposition: A | Discharge status: Home / Self Care | Payer: Medicare Other | Source: Ambulatory Visit | Attending: General Surgery | Admitting: General Surgery

## 2010-07-13 DIAGNOSIS — I10 Essential (primary) hypertension: Secondary | ICD-10-CM | POA: Insufficient documentation

## 2010-07-13 DIAGNOSIS — D649 Anemia, unspecified: Secondary | ICD-10-CM | POA: Insufficient documentation

## 2010-07-13 DIAGNOSIS — J449 Chronic obstructive pulmonary disease, unspecified: Secondary | ICD-10-CM | POA: Insufficient documentation

## 2010-07-13 DIAGNOSIS — Y833 Surgical operation with formation of external stoma as the cause of abnormal reaction of the patient, or of later complication, without mention of misadventure at the time of the procedure: Secondary | ICD-10-CM | POA: Insufficient documentation

## 2010-07-13 DIAGNOSIS — Z86718 Personal history of other venous thrombosis and embolism: Secondary | ICD-10-CM | POA: Insufficient documentation

## 2010-07-13 DIAGNOSIS — Z950 Presence of cardiac pacemaker: Secondary | ICD-10-CM | POA: Insufficient documentation

## 2010-07-13 DIAGNOSIS — Z79899 Other long term (current) drug therapy: Secondary | ICD-10-CM | POA: Insufficient documentation

## 2010-07-13 DIAGNOSIS — T8183XA Persistent postprocedural fistula, initial encounter: Secondary | ICD-10-CM | POA: Insufficient documentation

## 2010-07-13 DIAGNOSIS — Z01812 Encounter for preprocedural laboratory examination: Secondary | ICD-10-CM | POA: Insufficient documentation

## 2010-07-13 DIAGNOSIS — K909 Intestinal malabsorption, unspecified: Secondary | ICD-10-CM | POA: Insufficient documentation

## 2010-07-13 DIAGNOSIS — J4489 Other specified chronic obstructive pulmonary disease: Secondary | ICD-10-CM | POA: Insufficient documentation

## 2010-07-13 DIAGNOSIS — K9429 Other complications of gastrostomy: Secondary | ICD-10-CM | POA: Insufficient documentation

## 2010-07-14 NOTE — Op Note (Signed)
[name], [name]               ACCOUNT NO.:  [id number]  MEDICAL RECORD NO.:  [id number]           PATIENT TYPE:  O  LOCATION:  1538                         FACILITY[facility]  [facility]  PHYSICIAN:  Juanetta Gosling, MDDATE OF BIRTH:  1945/07/12  DATE OF PROCEDURE: DATE OF DISCHARGE:                              OPERATIVE REPORT   PREOPERATIVE DIAGNOSIS:  Gastrocutaneous fistula, status post gastrostomy tube which has failed to close.  POSTOPERATIVE DIAGNOSIS:  Gastrocutaneous fistula, status post gastrostomy which has failed to close.  PROCEDURE:  Closure of gastrocutaneous fistula.  SURGEON:  Emelia Loron, M.D.  ASSISTANT:  None.  ANESTHESIA:  General.  ANESTHESIOLOGIST:  Hezzie Bump. Okey Dupre, M.D.  SPECIMEN:  Gastrocutaneous fistula sent to pathology.  ESTIMATED BLOOD LOSS:  Minimal.  COMPLICATIONS:  None.  DRAINS:  None.  DISPOSITION:  To recovery room in stable condition.  INDICATIONS:  [name] is a 65 year old female well known to me from an exploratory laparotomy, extensive lysis of adhesions, and gastrostomy tube placement.  I have also placed a port in her.  She has been doing pretty well and Dr. Juanda Chance had removed her gastrostomy tube and this had failed to close.  When I have seen her, she had a fairly significant chemical reaction on her skin and I discussed with her that would go to the operating room and repair this, but I would wanted to clean her skin up a little bit, so interventional Radiology replaced her gastrostomy tube, we left this heal and is now doing much better.  I then discussed, take her to the operating room and closing her gastrocutaneous fistula.  PROCEDURE:  After informed consent was obtained, the patient was administered 1 g of intravenous cefazolin.  Sequential compression devices were placed to lower extremities prior to induction of the anesthesia.  She then had a port access for the procedure.  She then was placed in general  endotracheal anesthesia without complication.  A Foley catheter was placed.  Her abdomen was then prepped and draped in a standard sterile surgical fashion leaving her gastrostomy tube in place. I then made an elliptical incision surrounding her gastrostomy site and carried this down to the fascia.  This was then brought medially to the tube where I then could identify her tube as well as her stomach.  I excise ellipse of skin.  I then removed the tube, I could see the gastrocutaneous fistula at this point at the fascia where I had performed Stamm gastrostomy previously.  I then closed this with numerous interrupted 2-0 silk sutures.  This all appeared intact and appeared to approximate the mucosa completely.  There was no leakage when I had completed this.  I then irrigated this copiously, obtained hemostasis.  I closed the portion of the deep layer with 2-0 Vicryl sutures.  I then stapled the lateral portions of the incision.  I left the middle open and I did pack this with Iodoform gauze.  Sterile dressing was placed.  She tolerated this well, was extubated in the operating room and transferred to recovery room in stable condition.     Juanetta Gosling, MD  MCW/MEDQ  D:  07/13/2010  T:  07/14/2010  Job:  [id number]  cc:   Hedwig Morton. Juanda Chance, MD [address] [place] [address]  Colleen Can. Deborah Chalk, M.D. Fax: [phone]  Duke Salvia, MD, [facility] [address] [place] [address]  Lelon Perla, DO [address] [place], [place] [address]  Electronically Signed by Emelia Loron MD on 07/14/2010 10:05:19 AM

## 2010-07-17 ENCOUNTER — Ambulatory Visit (INDEPENDENT_AMBULATORY_CARE_PROVIDER_SITE_OTHER): Payer: Medicare Other | Admitting: Internal Medicine

## 2010-07-17 DIAGNOSIS — E538 Deficiency of other specified B group vitamins: Secondary | ICD-10-CM

## 2010-07-24 ENCOUNTER — Telehealth: Payer: Self-pay | Admitting: Family Medicine

## 2010-07-24 NOTE — Telephone Encounter (Signed)
Patient needs refills for Oxycodone and Fentanyl pain patches---please call her when ready for pickup

## 2010-07-24 NOTE — Telephone Encounter (Signed)
Both meds filled 06/20/10 patient has and appt scheduled for 08/15/10 please advise      KP

## 2010-07-24 NOTE — Telephone Encounter (Signed)
Ok to fill x1 each 

## 2010-07-25 MED ORDER — OXYCODONE-ACETAMINOPHEN 10-325 MG PO TABS: 1.0000 | ORAL_TABLET | ORAL | Status: DC | PRN

## 2010-07-25 MED ORDER — FENTANYL 50 MCG/HR TD PT72: 1.0000 | MEDICATED_PATCH | TRANSDERMAL | Status: DC

## 2010-07-25 NOTE — Telephone Encounter (Signed)
Pt aware Rx ready for pick up.    KP 

## 2010-07-26 ENCOUNTER — Ambulatory Visit (INDEPENDENT_AMBULATORY_CARE_PROVIDER_SITE_OTHER): Payer: Medicare Other | Admitting: Internal Medicine

## 2010-07-26 ENCOUNTER — Ambulatory Visit: Payer: Medicare Other | Admitting: Family Medicine

## 2010-07-26 ENCOUNTER — Encounter: Payer: Self-pay | Admitting: Internal Medicine

## 2010-07-26 VITALS — BP 122/70 | HR 68 | Ht 62.0 in | Wt 98.0 lb

## 2010-07-26 DIAGNOSIS — R633 Feeding difficulties, unspecified: Secondary | ICD-10-CM

## 2010-07-26 DIAGNOSIS — K316 Fistula of stomach and duodenum: Secondary | ICD-10-CM

## 2010-07-26 DIAGNOSIS — K56609 Unspecified intestinal obstruction, unspecified as to partial versus complete obstruction: Secondary | ICD-10-CM

## 2010-07-26 NOTE — Progress Notes (Signed)
MARTINIQUE PIZZIMENTI 1945-06-15 MRN [id number]    History of Present Illness:  This is a 65 year old white female with a gastrocutaneous fistula, which failed to close surgically. She has chronic low-grade small bowel obstruction question of malabsorption. She is status post exploratory laparotomy and lysis of adhesions one year ago. She was hospitalized recently for an upper GI bleed Pradaxa. She has COPD, atrial fibrillation, B12 deficiency and biventricular pacemaker. She had a closure of the gastrocutaneous fistula done 2 weeks ago by Dr. Dwain Sarna. The wound was closed for 4 days before it started to leak small amounts of serous fluid. She ate some rice yesterday and noticed some rice coming out of the wound this morning.   Past Medical History  Diagnosis Date  . Small bowel obstruction   . Anemia   . HTN (hypertension)   . Osteoarthritis   . GERD (gastroesophageal reflux disease)   . CAD (coronary artery disease)   . LBBB (left bundle branch block)   . Atrial fibrillation     Dr. Roger Shelter  . 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 Fibromyalgia   . Diverticulosis   . COPD (chronic obstructive pulmonary disease)     -06/28/08 PFT FEV1 1.95 (97%), FVC 2.79 (101%), FEV1% 70, TLC 3.88 (87%), DLCO 64%  . Pneumonia - 03/09, 12/09, 02/10, 06/10  . Pleural effusion      s/p right thoracentesis 03/09  . Small bowel obstruction   . DDD (degenerative disc disease)   . Urinary incontinence   . RLS (restless legs syndrome)   . Primary dilated cardiomyopathy   . Vitamin B12 deficiency   . Gastroparesis   . Chronic nausea   . Chronic abdominal pain   . Chronic pain syndrome    Past Surgical History  Procedure Date  . Colectomy     Intestinal resection (5times),S/P PARTIAL COLECTOMY 2001  . Appendectomy   . Bladder surgery   . Tonsillectomy   . Picc line   . Total abdominal hysterectomy   . Pacemaker placement     PPM-Medtronic  . Peg  placement   . Abdominal adhesion surgery     numerous surgeries  . Peg removed     with complications    reports that she quit smoking about 24 years ago. She has never used smokeless tobacco. She reports that she does not drink alcohol or use illicit drugs. family history includes Heart disease in her father and Stroke in her mother.  There is no history of Colon cancer. Allergies  Allergen Reactions  . Pentazocine Lactate   . Pradaxa   . Sulfonamide Derivatives   . Talwin         Review of Systems: Denies dysphagia odynophagia. Positive for vomiting. Positive for intermittent abdominal pain relieved by vomiting her weight has been stable around 9-8 pounds.  The remainder of the 10  point ROS is negative except as outlined in H&P   Physical Exam: General appearance  Well developed in no distress, chronically ill-appearing Eyes- non icteric HEENT nontraumatic, normocephalic Mouth no lesions, tongue papillated, no cheilosis Neck supple without adenopathy, thyroid not enlarged, no carotid bruits, no JVD Lungs Clear to auscultation bilaterally Cor normal S1 normal S2, regular rhythm , no murmur,  quiet precordium Abdomen gastrocutaneous fistula left upper quadrant. Small amount of sanguinous fluid soaking the 4x4 pad,  erythema and maceration surrounding the fistula.. Rectal deferred: Extremities no pedal edema Skin no lesions Neurological alert  and oriented x 3. Psychological normal mood and affect.  Assessment and Plan:  Gastrocutaneous fistula with failure of surgical closure. She is malnourished. She has an albumin 2.4. Partial small bowel obstruction leads to increased intrapressure in the stomach and adversely effects the healing of the fistula. I have spoken to Dr. Dwain Sarna about it. She has an appointment with him today. One option would be to put her on bowel rest and home TPN to see if he can improve her nutritional status.  Partial small bowel obstruction. Patient  has low-grade obstruction with exacerbations at frequent intervals. Not all episodes require hospitalization. She has a Port-A-Cath through which she can administer IV fluids. Her last infusion was several weeks ago.  Anemia. This is multifactorial. Some of it is from chronic disease as well and because she is status post upper GI bleed. She is to continue her B12 supplements as well as iron supplements.   07/26/2010 [name]

## 2010-07-28 ENCOUNTER — Other Ambulatory Visit: Payer: Self-pay

## 2010-07-28 MED ORDER — GABAPENTIN 600 MG PO TABS: 600.0000 mg | ORAL_TABLET | Freq: Four times a day (QID) | ORAL | Status: DC

## 2010-08-01 NOTE — Op Note (Signed)
[name], [name]               ACCOUNT NO.:  [id number]   MEDICAL RECORD NO.:  [id number]          PATIENT TYPE:  AMB   LOCATION:  [facility]                         FACILITY:  [facility]   PHYSICIAN:  Martina Sinner, MD DATE OF BIRTH:  1945/05/24   DATE OF PROCEDURE:  10/23/2007  DATE OF DISCHARGE:                               OPERATIVE REPORT   PREOPERATIVE DIAGNOSIS:  Malfunctioning InterStim surgery.   POSTOPERATIVE DIAGNOSIS:  Malfunctioning InterStim surgery.   SURGERY:  Replacement of InterStim.   [name] had a well-placed InterStim at S3 foramina more than a  year ago.  She was dramatically improved.  She fell and actually broke  her arm secondary to an arrhythmia and since that time her device has  not worked.  The impedance was checked just before surgery as well as in  the clinic and the impedance was over 4000 on all four positions.   An AP and lateral view were taken in the operating room of the original  lead placement using fluoroscopy.  This was done after sterile technique  with a 10 minute scrub and prep.  We used a large Iodoform dressing as  usual.  I could identify both incisions.  Hard copies were taken for  comparison and I also used the left side of a fluoroscopic machine to  freeze frame her original lead position.   I initially opened up the 4 cm incision overlying the palpable  generator.  I easily opened the sheath not injuring the lead and removed  the generator and lead connected to it.   I then opened up the 1 cm incision overlying the right S3 foramina but  made it approximately 1-1/2 and possibly 2 inches in length.  I used  fluoroscopy and a hemostat and there was no question the lead was just  below the incision.  I was able to easily locate it.  I passed a  hemostat around it but did not pull backward.  I then disconnected the  lead from the generator and threaded the lead out through the midline  incision.  I left the lead in situ as  a marker of the S3 foramina.  I  easily passed a 3.5 inch foramen needle through the S3 foramina.  I did  so approximately six times.  The last fluoroscopically best mimicked the  original lead access and position.  Even after giving some reversal of  muscle relaxants I could not could not get a bellows or toe reaction.  She did have good motor responses not under general anesthetic.   I did an AP view as well as a lateral view and the leads were almost  superimposable on one another and I felt that it should be left in that  position.  The AP view looked excellent.  On the lateral view the more  caudal lead is the original and on the AP view the lead that is a few  millimeters lateral towards the hip of the original lead is the new  intact lead.   With the foramen needle in place I gently  attempted to remove the  original lead pulling straight back in the correct axis under  fluoroscopic guidance.  There was some resistance.  I pulled a little  bit harder but with minimal tension and the inner wire dislodged from  the distal part of the lead leaving a retained segment.  I was surprised  because I was not pulling hard and I was doing this under fluoroscopic  guidance.   The distal aspect of the original lead was left in situ and I inserted a  new lead with the usual technique.   I removed the inner core of the foramen needle and passed the wire guide  down to the first line.  I removed the foramen needle.  I then placed  the white introducer with its inner sheath to the bone table using its  marker.  I removed the inner sheath and inserted the lead with its inner  core guidewire down to the first line.  I then checked fluoroscopically  and I thought it was in good position.  I then advanced just proximal to  the second line and performed fluoroscopy.  I pulled back on both the  sheath and the lead bringing position zero just at the level of the bone  table.  I removed the white  sheath easily without changing position of  the lead under fluoroscopic guidance followed by removal of the  guidewire inner wire.  All positions were checked for bellows but again  there was not a good bellows response.  AP and lateral views were taken.  I was very happy fluoroscopically with the position of the new lead.  I  felt that the lack of motor responses were due to the fact that she was  under general anesthetic and she had received muscle relaxants earlier.   I then used a tunneling device to tunnel a subcutaneous tunnel to the  lateral incision near her left iliac crest.  I delivered the lead.  I  connected to a clean generator and used a screwdriver in the usual  fashion.  I easily placed it into its pseudocapsule pocket without  mobility that would cause the device to flip over.  [employer] labeling  was facing up.  We then checked impedance of all four positions and it  was normal.   Copious irrigation with antibiotics was utilized.  Running 3-0 Vicryl  for the subcutaneous tissue was used for the lateral incision followed  by 4-0 Vicryl subcuticular.  The same closure was used for the midline  incision.  The patient was cleaned and small watertight dressings and  Telfa was applied.   I am hoping that [name] will have a duplicate good response from  replacing her InterStim.  I am going to send her home with Keflex and  Vicodin and see her in a week.  My nurses will call her tomorrow.           ______________________________  Martina Sinner, MD  Electronically Signed     SAM/MEDQ  D:  10/23/2007  T:  10/23/2007  Job:  [id number]

## 2010-08-01 NOTE — Assessment & Plan Note (Signed)
[facility]                         GASTROENTEROLOGY OFFICE NOTE   NAME:[name], [name]                      MRN:          [id number]  DATE:09/16/2006                            DOB:          [birth year]    [name] is a 65 year old white female with chronic partial small  bowel obstruction due to multiple adhesions.  She has a PICC line for IV  access.  We see her every few months.  Her nutritional parameters are  monitored.  Last prealbumin level was 17.  She went to the beach last  week for a whole week and forgot to bring her IV fluids with her.  She  became rather sick during the vacation and was vomiting and having  diarrhea almost on a daily basis.  She did not go to an emergency room  or did not return from vacation.  She managed her family and returned  after a week of vacation only to find out that she had lost about 6  pounds.  She still has not used any IVs which usually her husband takes  care of by hooking her up to her IV PICC line.  She has not vomited now  for 2 days and has not had any diarrhea for about 2 days.  There is no  fever.  She feels somewhat weak and worn out.   MEDICATIONS:  1. Celebrex 200 mg p.o. b.i.d.  2. Cardura 4 mg p.o. daily.  3. Temazepam p.r.n.  4. Gabapentin 600 mg p.o. daily.  5. Sanctura 20 mg p.o. b.i.d.  6. Mirapex 0.125 mg nightly.  7. Amlodipine 5 mg p.o. daily.  8. Fentanyl  patch 50 mcg q.72 h.  9. Amitriptyline 25 mg p.o. daily.  10.Phenergan 25 mg p.r.n.  11.Oxycodone 1 p.o. b.i.d.   PHYSICAL EXAMINATION:  Blood pressure 106/64, pulse was 76, and weight  was 93.8 pounds, last weight on Aug 16, 2006 was 98.2 pounds.  Prior to  that weight was 101 pounds.  The patient appeared mildly dehydrated, alert and oriented.  Sclerae was nonicteric.  Mucous membranes were moist.  LUNGS:  Clear to auscultation.  COR:  Normal S1, normal S2.  ABDOMEN:  Soft, flat, relaxed with increased tympany in the  upper  abdomen but no visible distension.  There was no tenderness.  RECTAL EXAM:  Soft, hemoccult-negative stool.  EXTREMITIES:  No edema.   IMPRESSION:  A 65 year old white female with partial chronic small bowel  obstruction who had episodic nausea and vomiting last week while on  vacation at the beach.  She did not resume her IV fluids as she is  supposed to since she forgot her supplies at home.  This was emphasized  to her, that she is responsible for bringing her medications with her  wherever she goes to prevent serious dehydration.  It almost seems to me  that the patient is manipulative in that she almost wanted to be sick  while at the beach, but would not help herself in any way.  She was  asking today if she needs a TPN, but I would be  reluctant to do that at  this point unless we show her that she has deteriorating parameter.   PLAN:  1. The patient needs to take her IV fluids if she travels.  2. Renal profile and serum prealbumin today.  3. Low-residue diet, small meals, frequent feedings.  I will see her      again in 6-8 weeks.     Hedwig Morton. Juanda Chance, MD  Electronically Signed    DMB/MedQ  DD: 09/16/2006  DT: 09/16/2006  Job #: [id number]   cc:   Leanne Chang, M.D.

## 2010-08-01 NOTE — Discharge Summary (Signed)
NAMEANISTEN, TOMASSI               ACCOUNT NO.:  [id number]   MEDICAL RECORD NO.:  [id number]          PATIENT TYPE:  OBV   LOCATION:  1403                         FACILITY:  [facility]   PHYSICIAN:  Rosalyn Gess. Norins, MD  DATE OF BIRTH:  17-Sep-1945   DATE OF ADMISSION:  02/12/2007  DATE OF DISCHARGE:  02/14/2007                               DISCHARGE SUMMARY   ADMITTING DIAGNOSES:  1. Syncope.  2. Fracture of the right shoulder.  3. Urinary tract infection.  4. Mild coronary artery disease by cardiac cath in January 2008.   DISCHARGE DIAGNOSES:  1. Syncope.  2. Fracture of the right shoulder.  3. Urinary tract infection.  4. Mild coronary artery disease by cardiac cath in January 2008.   CONSULTANTS:  Dr. Delfin Edis for Cardiology.   PROCEDURES:  1. Left shoulder film to views revealing diffuse osteoporosis, oblique      fracture of the left humeral neck with minimal displacement no      dislocation on AP exam.  Adjacent ribs were intact.  2. Left wrist films February 12, 2007, which showed osteoporosis with      scattered degenerative change.  No definitive acute bony      abnormality.  3. X-ray coccyx and sacrum February 12, 2007, showed bony      demineralization without definitive sacrococcygeal fracture.  4. CT of the brain February 12, 2007, read as no acute abnormalities.   HISTORY OF PRESENT ILLNESS:  Ms. Rozas is a 65 year old woman with past  medical history of recurrent small-bowel obstruction and hypertension  who had been doing well with no previous episode of syncope.  Today, she  had a sudden drop in sat while at her house with loss of consciousness.  She reports she had no warning signs or symptoms.  She injured herself  in her fall sustaining a left humeral fracture.  The patient was brought  to emergency department for further evaluation.  She was hemodynamically  stable.  She was found to have mild left shift on CBC with a white count  6,800.   Radiographic studies were as noted.  Because of the patient's  syncope, thought to be a possible cardiac origin, she was admitted for  observation on telemetry.  Please see the admitting H&P for past medical history, family history,  social history, and medications.   HOSPITAL COURSE:  1. Syncope.  The patient had no further syncopal events while in the      hospital.  She remained in stable rhythm on telemetry.  She was      seen in consultation by Dr. Roger Shelter.  It is felt that she      had no significant cardiac problem requiring acute intervention.      Her cardiac enzymes were checked and were negative x3.  Plan is for      the patient to have an event recorder which would be placed at Dr.      Ronnald Nian office on the day of discharge.  2. Orthopedics.  The patient with a minimally displaced rather humeral  fracture..  The patient was not evaluated by orthopedics during her      hospital stay.  She was placed in an immobilizing sling.  She was      treated for pain initially with IV Dilaudid.  She was converted to      the fentanyl patch and was to continue Percocet as needed for      breakthrough and Celebrex and Flexeril as needed.  The patient will      need be seen by orthopedics as an outpatient.  Her preference is      Dr. Norlene Campbell at [facility].  We will ask Dr. Laqueta Linden office to      schedule her an appointment.  In the interval, she is to continue      with an immobilizing sling.  3. Urinary tract infection.  The patient did have a positive      urinalysis.  She will be discharged home on Cipro at 250 mg twice      daily for 5 days.   FINAL EXAMINATION:  Temperature of 98.3, blood pressure 122/72, heart  rate was 76, respirations were 18, O2 sat was 91% on room air.  GENERAL  APPEARANCE:  This is very slender woman in no acute distress.  PULMONARY: The patient is moving air well.  She has no increased work of  breathing.  CARDIOVASCULAR:  2+ radial  pulse.  Precordium was quiet.  She had a  regular rate and rhythm.  ABDOMEN was soft.  No further examination conducted.   DISCHARGE MEDICATIONS:  The patient will resume all her home medications  including  1. Celebrex 200 mg daily.  2. Cardura 4 mg daily.  3. Flexeril 10 mg 3 times daily.  4. Neurontin 600 mg daily.  5. Percocet 10/325 orally every 6 hours as needed.  6. Mirapex 0.125 mg orally at bedtime.  7. Norvasc 5 mg daily.  8. Fentanyl patch at 50 mcg every 72 hours.  9. Prilosec 20 mg daily.   DISPOSITION:  The patient is to go to Dr. Duffy Rhody Tennant's office on  the day of discharge to be fitted with an event recorder.  She will need  to follow up with Dr. Blossom Hoops on Monday or Tuesday of next week.  Will  notify his office to set her up for follow-up with orthopedics, Dr.  Norlene Campbell.  The patient would be a candidate to consider for  Forteo given her significant osteoporosis and new fracture; and will  defer this to Dr. Blossom Hoops.   The patient's condition at time of discharge dictation was stable.      Rosalyn Gess Norins, MD  Electronically Signed     MEN/MEDQ  D:  02/14/2007  T:  02/14/2007  Job:  [id number]   cc:   Leanne Chang, M.D.  Fax: [phone]   Claude Manges. Cleophas Dunker, M.D.  Fax: [phone]   Colleen Can. Deborah Chalk, M.D.  Fax: [phone]

## 2010-08-01 NOTE — Assessment & Plan Note (Signed)
[facility]                        [place] OFFICE NOTE   NAME:HOD[place], Gamero                      MRN:          [id number]  DATE:07/18/2006                            DOB:          February 20, 1946    CONTINUATION:   PLAN:  1. Patient will be switched over to Norvasc 5 mg daily.  She will      discontinue Verelan PM.  She was advised to monitor her blood      pressure regularly and to follow up with me in three weeks, or      sooner if her blood pressure is elevated.  Of note, patient reports      that her blood pressure has been checked weekly by the nurse and      has run in the 120s to upper 70s to 80.  2. We will also check a BMET.  3. In regards to the Celebrex, I advised the patient there is no      generic to Celebrex or anything comparable in generic version.      Patient is okay with that.  4. Further recommendations after lab review.     Leanne Chang, M.D.     LA/MedQ  DD: 07/18/2006  DT: 07/18/2006  Job #: [id number]

## 2010-08-01 NOTE — Discharge Summary (Signed)
NAMEJACLYNN, [name]               ACCOUNT NO.:  [id number]   MEDICAL RECORD NO.:  [id number]          PATIENT TYPE:  INP   LOCATION:  1443                         [facility]ACILITY:  [facility]   PHYSICIAN:  Valerie A. Felicity Coyer, MDDATE OF BIRTH:  October 06, 1945   DATE OF ADMISSION:  08/07/2007  DATE OF DISCHARGE:  08/19/2007                               DISCHARGE SUMMARY   DISCHARGE DIAGNOSES:  1. Pneumonia thought secondary to aspiration.  2. Right pleural effusion.  3. Altered mental status on admit.  4. Hypoglycemia on admit  5. Chronic pain syndrome with fibromyalgia.  6. Positive fecal occult blood with chronic anemia.   HISTORY OF PRESENT ILLNESS:  [name] is a 65 year old white female  with past medical history of fibromyalgia with chronic pain syndrome and  chronic narcotic use as well as history of recurrent small-bowel  obstructions.  The patient was brought to the emergency room on day of  admission after being found unresponsive on the floor by her daughter.  Daughter reported earlier episode of emesis.  Upon EMS arrival at home,  the patient found to be hypoglycemic and daughter reported the patient  acting more tired and sleepy than usual prior to this episode,  complaining of abdominal pain for 3 days with vomiting yellow-green  bilious emesis.  Upon evaluation in the ER, the patient's mentation did  improve with 1 amp of D50 given.  Chest x-ray done revealed a patchy  right lower lobe/right upper lobe airspace disease consistent with  pneumonia.  In addition, urinalysis revealed many bacteria with 7-10  WBCs with white cell count of 13.7.  The patient was admitted at that  time for further evaluation and treatment.   PAST MEDICAL HISTORY:  1. Fibromyalgia with chronic pain syndrome on chronic narcotics.  2. Mild CAD, cath in January of 2008.  3. Recurrent small-bowel obstructions status post resection with      chronic PICC line.  4. Hypertension.  5. Osteoarthritis with  DJD.  6. Chronic left bundle branch block.  7. Status post appendectomy.  8. Status post hysterectomy.   HOSPITAL COURSE:  Problem 1.  Pneumonia with right pleural effusion.  As  mentioned in HPI, patient with altered mental status with nausea,  vomiting, abdominal pain on admission.  Acute abdominal series done at  time of admission revealed patchy right lower lobe and right upper lobe  air space disease consistent with pneumonia.  The patient was placed on  IV Rocephin and azithromycin at time of admission. Pneumonia thought  secondary to aspiration in the setting of emesis just prior to episode  of altered mental status.  The patient did experience decreased white  blood cell count and remained afebrile with IV antibiotics.  However,  oxygen saturations remained decreased with continued consolidation on  chest x-ray.  Due to persistent chest consolidation and decreased O2  saturation, the patient seen in consultation by pulmonology on Aug 15, 2007 for hypoxic respiratory failure thought secondary to aspiration  pneumonia.  At the time of pulmonology evaluation, the patient had  completed a total 9 days IV azithromycin and  Rocephin.  However, it was  recommended that the patient be placed back on p.o. Avelox, titrate O2.  No steroids were indicated throughout hospitalization.  At this time,  the patient has completed a total of 4 days p.o. Avelox.  O2 saturations  have stabilized; however, she still is experiencing slight decrease in  O2 saturation upon ambulation on room air as low as 82-86% documented.  Because of this, the patient will be sent home on O2 nasal cannula at 2  liters per minute until further instructed by primary care physician.  Also of note, chest x-ray repeated on Aug 17, 2007 revealed improving  bilateral airspace opacities.  The patient also suffered from right  pleural effusion during hospitalization, likely reactive in setting of  pneumonia.  She did undergo  thoracentesis on Aug 13, 2007 with 580 mL  drawn off.  Culture of this fluid was negative for any growth.   Problem 2.  Hypoglycemia on admit.  The patient's hypoglycemia thought  secondary to infection, as episodes did resolve approximately 48 hours  after admission.  A1c obtained at this admission 5.6.   Problem 3.  Positive fecal occult blood throughout admission with  anemia.  Of note, the patient with chronic iron deficiency anemia.  Hemoglobin has fluctuated throughout hospitalization from 9.3 up to 10.3  and down to 9.4 at time of discharge.  The patient asymptomatic.  Denies  any gross hematochezia.  She has an outpatient followup scheduled with  [name].  At this time, she is to continue her proton pump inhibitor  as prior to this admission.  It is suspected that the patient has  chronic GI loss anemia.   Problem 4.  UTI with positive UA on admit.  Urine culture revealed  greater than 100,000 colonies of Klebsiella sensitive to antibiotics  used in treatment of problem #1.   DISCHARGE MEDICATIONS:  1. Avelox 400 mg p.o. daily until gone.  2. O2 2 liters nasal cannula at all times until otherwise instructed      by primary care physician.  3. Neurontin 600 mg p.o. q.i.d.  4. Flexeril 10 mg p.o. t.i.d.  5. Cardura 4 mg p.o. daily.  6. Prilosec 20 mg p.o. t.i.d.  7. Flecainide 50 mg p.o. b.i.d.  8. Duragesic patch 50 mcg change q.72h.  9. Elavil 25 mg p.o. daily.  10.Norvasc 10 mg p.o. daily.  11.Alendronate 70 mg p.o. weekly.  12.Indomethacin 10/325 q.8h. p.r.n. pain.  13.Temazepam 50 mg p.o. nightly p.r.n.  14.Calcium 600 mg p.o. daily.  15.Albuterol 90 mcg inhaler 2 puffs q.4h. p.r.n. shortness of breath.  16.Celebrex 200 mg p.o. b.i.d.   PERTINENT LABORATORY DATA AT TIME OF DISCHARGE:  White cell count 9.6,  platelet count 525, hemoglobin 9.4, hematocrit 28.0.  Sodium 135,  potassium 4.3, BUN 11, creatinine 0.71.  Hemoglobin A1c 5.6.   DISPOSITION:  The  patient felt medically stable for discharge home at  this time.  At time of this dictation, we are awaiting physical therapy  consult to determine if any home needs.  The patient also to be  discharged on home O2, as  mentioned above, to be monitored by primary care physician and hopefully  titrated off in the very near future.  The patient is scheduled to  follow up with her primary care physician, Dr. Loreen Freud, on August 26, 2007 at 3:45 p.m.   TIME SPENT AT DISCHARGE:  Greater than 30 minutes spent on discharge  planning.  Cordelia Pen, NP      Raenette Rover. Felicity Coyer, MD  Electronically Signed    LE/MEDQ  D:  08/19/2007  T:  08/19/2007  Job:  [id number]   cc:   Lelon Perla, DO  [address] [place], [place] [address]

## 2010-08-01 NOTE — H&P (Signed)
[name], [name]               ACCOUNT NO.:  [id number]   MEDICAL RECORD NO.:  [id number]          PATIENT TYPE:  EMS   LOCATION:  ED                           FACILITY:  [facility]   PHYSICIAN:  Hollice Espy, M.D.DATE OF BIRTH:  1945-12-19   DATE OF ADMISSION:  02/12/2007  DATE OF DISCHARGE:                              HISTORY & PHYSICAL   PRIMARY CARE PHYSICIAN:  Leanne Chang, MD   CHIEF COMPLAINT:  Syncope.   HISTORY OF PRESENT ILLNESS:  The patient is a 65 year old white female  with past medical history of some recurrent small bowel obstruction and  hypertension, who has been in previously good health with no previous  episodes of syncope but all of a sudden today, she was standing by her  bar at her house when all of a sudden she passed out.  She had  absolutely no warning signs, no previous recent illness, no  lightheadedness.  One minute she was standing, the next minute she had  fallen.  She landed on her left shoulder, leading to a left humeral  fracture, minimally displaced.  The patient was brought into the  emergency room for further evaluation.  On arrival her blood pressure  was noted to be 129/59 with a heart rate of 99.  She was saturating 99%  on room air.  Labs were ordered on the patient.  She was found to have a  normal BMET with no evidence of electrolyte abnormalities or  dehydration.  Her white count was found to be normal at 6.8 but she was  noted to have an 81% shift.  A CT scan of the head was done, which was  unremarkable.  The rest of her labs were unremarkable, again, with the  exception of her left shoulder x-ray.  I have ordered a urinalysis as  well as an albumin level, which is pending.   The patient currently after receiving some pain medication is feeling a  little bit better but she still complains of some generalized soreness.  She otherwise denied any headaches, vision changes, dysphagia.  No chest  pain, palpitations, shortness of  breath, wheezing, coughing.  No  abdominal pain.  No hematuria, dysuria, constipation or diarrhea, focal  extremity weakness, numbness or pain other than as expected in her left  shoulder.  Review of systems otherwise negative.   PAST MEDICAL HISTORY:  1. A history of fibromyalgia.  2. Recurrent small bowel obstruction.  3. Osteoarthritis.  4. Hypertension.  5. DJD.   Medications from her last [facility] note:  1. Celebrex 200 mg p.r.n.  2. Cardura 4 mg.  3. Flexeril 10 mg t.i.d.  4. Neurontin 600 mg daily.  5. Percocet 10/325 mg p.o. p.r.n.  6. Mirapex 0.125 mg p.o. q.h.s.  7. Norvasc 5 mg daily.  8. Fentanyl patch 50 mcg q.72h.  9. Prilosec 20 mg.   She has allergy to TALWIN.   SOCIAL HISTORY:  No tobacco, alcohol or drug use.   FAMILY HISTORY:  Noncontributory.   PHYSICAL EXAM:  VITALS ON ADMISSION:  Temperature 97.9, heart rate 99,  blood  pressure 129/59, respirations 16, O2 saturation 99% on room air.  GENERAL:  She is alert and oriented x3, in no apparent distress.  HEENT:  Normocephalic, atraumatic.  Her mucous membranes are slightly  dry.  She has no carotid bruits.  HEART:  Regular rate and rhythm, S1, S2.  LUNGS:  Clear to auscultation bilaterally.  ABDOMEN:  Soft, nontender, nondistended, positive bowel sounds.  EXTREMITIES:  No clubbing, cyanosis, or edema.  Her left arm is in a  sling.   LAB WORK:  Her EKG shows normal sinus rhythm with a left bundle branch  block.  No previous EKG to compare to.   X-rays:  CT scan of the head negative.  Left shoulder:  Minimally-  displaced proximal left femur and osteoporosis.  Left wrist:  Osteoporosis, scattered degenerative changes.  Sacrum/Coccyx:  Bony  demineralization without definite sacrococcygeal fracture.   Lab work:  Sodium 138, potassium 3.9, chloride 102, bicarb 30, BUN 17,  creatinine 0.8, glucose 84.  White count 6.8, H&H 11.7 and 34, MCV of  90, platelet count 301, 81% neutrophils.  I have ordered an  albumin and  a urinalysis.   ASSESSMENT AND PLAN:  1. Syncope, cause unclear.  This may be cardiac-related.  We will      check a CPK, MB and troponin as well as look for other causes,      including noting her neutrophil shift.  To rule out infection,      check a urinalysis as well as check an albumin level to rule out      malnutrition.  Also monitor for arrhythmia.  2. Humeral fracture.  Physical therapy plus sling.  3. Hypertension.  Continue Cardura and monitor.      Hollice Espy, M.D.  Electronically Signed     SKK/MEDQ  D:  02/12/2007  T:  02/12/2007  Job:  [id number]   cc:   Leanne Chang, M.D.  Fax: [phone]

## 2010-08-01 NOTE — Consult Note (Signed)
NAMEZYANNE, SCHUMM               ACCOUNT NO.:  [id number]   MEDICAL RECORD NO.:  [id number]          PATIENT TYPE:  INP   LOCATION:  1443                         FACILITY:  [facility]   PHYSICIAN:  Charlcie Cradle. Delford Field, MD, FCCPDATE OF BIRTH:  16-Aug-1945   DATE OF CONSULTATION:  08/15/2007  DATE OF DISCHARGE:                                 CONSULTATION   A 65 year old white female with previous chronic medical problems  brought to the emergency room on Aug 07, 2007, after being found  unresponsive on the floor by her daughter, had an earlier episode of  emesis, found to be hypoglycemic and had complained of abdominal pain  for 3 days with vomiting yellow-green bilious material since 6 a.m. on  Aug 06, 2007.  She had been acting tired and more sleepy than usual  prior to this.  There had not been a bowel movement in 2 days.  She had  some headaches.  Abdominal pain was less and she was awake and alert on  arrival in Aug 07, 2007, with improved mental status following D50 x1  amp.  Subsequent to this there was infiltrate seen in the right lung and  left lung with right pleural effusion.  Subsequently had right pleural  effusion tapped which was exudative effusion.  Cultures of the blood are  negative.  The patient is improving on a course of Rocephin and  Zithromax with decreased shortness of breath and cough.  We were asked  to assess the bilateral infiltrates in the lung.   PAST MEDICAL HISTORY:  Medical history of fibromyalgia with chronic pain  syndrome and chronic narcotic use.  History of recurrent small bowel  obstruction, previous colon surgery, chronic PICC line placement,  history of osteoarthritis, chronic left bundle branch block, history of  coronary artery disease in the past with cath last January of 2008.   MEDICATIONS PRIOR TO ADMISSION:  1. Celebrex.  2. Cardura.  3. Flexeril.  4. Neurontin.  5. Fentanyl patch.  6. Prilosec.  7. Flecainide.  8. Elavil.  9.  Norvasc.  10.Mirapex.   ALLERGIES:  TALWIN, SULFA.   FAMILY HISTORY:  Is noncontributory.   SOCIAL HISTORY:  The patient lives with spouse at home, has daughters,  does not smoke or drink.   PHYSICAL EXAM:  GENERAL:  Now the patient is currently awake and alert.  Denies any active shortness of breath or cough.  CHEST:  Showed a few expiratory wheezes.  Decreased breath sounds.  Dry  rales at the right base.  Decreased breath sounds at right base.  CARDIAC:  Exam showed a regular rate and rhythm without S3.  Normal S1,  S2.  The patient is thin, alert.  ABDOMEN:  Soft.  Bowel sounds active.  Multiple abdominal scars noted.  NEUROLOGIC:  Intact.  EXTREMITIES:  Warm without edema and well-perfused.   Hemoglobin 10.3, white count 9000, platelet count 261,000, sodium 140,  potassium 3.8, chloride 110, CO2 21, BUN 13, creatinine 0.8, blood sugar  79.  CT scan of the chest and chest x-ray were reviewed and showed  bilateral infiltrates initially  with right pleural effusion.  Now the  pleural effusion is last after thoracentesis.  The pleural fluid was an  exudate.  The LDH in the pleural fluid was 389, total protein less than  3.  Pathology showed mesothelial cells.  Urine culture positive for  Klebsiella.  Chest x-ray now shows improved infiltrates on the right and  left.   IMPRESSION:  Impression is that of bilateral air space disease.  My  suspicion is the patient had emesis with aspiration resulting in  aspiration pneumonia with right parapneumonic effusion.  These are all  now improving as the patient's mental status has improved.  I am  concerned about the patient's constellation of chronic pain syndrome  with chronic narcotics and Neurontin use in addition to the chronic  bowel dysfunction and tendency towards alterations in consciousness and  aspiration.  There does not appear to be any evidence of transfer  dysphagia at this time.   RECOMMENDATIONS:  Would be to try to  minimize narcotic use if at all  possible because of recurrence of aspiration that will be seen.  No  indication for bronchoscopy.  Will up titrate oxygen.  No indication for  systemic steroids.  Will give albuterol nebs and flutter valve for  pulmonary toilet and follow expectantly with chest x-ray to clear and  change to oral Avelox, discontinue Rocephin and Zithromax in this  patient now.      Charlcie Cradle Delford Field, MD, [facility]  Electronically Signed     PEW/MEDQ  D:  08/15/2007  T:  08/15/2007  Job:  [id number]   cc:   Leanne Chang, M.D.  Fax: [phone]   Colleen Can. Deborah Chalk, M.D.  Fax: [phone]   Hedwig Morton. Juanda Chance, MD  [address]  [place]  [place] [address]   Christeen Douglas, Dr

## 2010-08-01 NOTE — Assessment & Plan Note (Signed)
[facility]                         GASTROENTEROLOGY OFFICE NOTE   NAME:[name], [name]                      MRN:          [id number]  DATE:12/11/2006                            DOB:          28-Oct-1945    [name] is a 65 year old white female with a chronic low-grade small  bowel obstruction due to adhesions.  Small bowel follow-through almost a  year ago showed dilated duodenum C-loop, delay in the passage of the  barium through the mid small bowel lasting at least 2 hours.  In 4 hours  there was a filling of the distal small bowel.  [name] also had a subtotal  colectomy during [name] last surgery about 5 years ago by Dr. Lurene Shadow.  [name]  symptoms include chronic abdominal pain, chronic nausea, occasional  vomiting with dehydration, and occasional diarrhea.  [name] has had low  nutritional parameters.  [name] prealbumin level recently was 17.1, normal  being 18 up to 28.  [name] weight has fluctuated at around 92 pounds.  [name]  maximum weight was 132 pounds approximately 7 years ago.   [name] has gastroesophageal reflux, for which [name] takes omeprazole.  Other  medication includes:  1. Celebrex 200 mg p.r.n.  2. Cardura 4 mg daily.  3. Cyclobenzaprine t.i.d.  4. Gabapentin 600 mg daily.  5. Endocet 10/325 mg.  6. Mirapex 0.125 mg q.h.s.  7. Amlodipine 5 mg daily.  8. Fentanyl patch 50 mcg q.72h.   [name] has a PICC line, which [name] uses rarely for IV hydration.  The last  time [name] used IV fluids was 2 weeks ago.   PHYSICAL EXAM:  Blood pressure 116/62, pulse 80 and a weight of 92.6  pounds.  Last weight 93.8 pounds.  [name] appeared chronically ill, very pleasant and cooperative.  LUNGS:  Clear to auscultation.  CARDIAC:  With normal S1, normal S2.  ABDOMEN:  Soft.  There is tenderness throughout with somewhat hypoactive  bowel sounds today.  A well-healed surgical scar vertically from upper  to lower abdomen with palpable retained sutures in the umbilical area.  RECTAL:  Not repeated today.   IMPRESSION:  A 65 year old white female with chronic low-grade small  bowel obstruction localized to somewhere in the mid small bowel, either  distal jejunum or proximal ileum.  [name] has symptoms of gastric  retention, bacterial overgrowth causing diarrhea.  [name] has a marginal  nutritional status.  The patient has also a PICC line for intravenous  hydration, which [name] does not use very often.   PLAN:  1. We have discussed again extensively [name] prognosis and further      management of [name] problems.  [name] seems to be functioning well,      having about 4 small children at home, being able to take care of      them.  [name] asked about the possibility of home TPN.  I suggested      that we wait with that since [name] prealbumin level is only      borderline and [name] also has a history of central line sepsis at one  point in the past.  2. I suggested that [name] see Dr. Lurene Shadow for surgical follow-up, since      he may be the one who would operate in the future if [name] needed      emergency surgery.  It would be to [name] advantage if Dr. Lurene Shadow      could see [name] at least once a year to assess [name] condition and help      [place] to make decisions.  3. Samples of Align probiotic, take one daily.  4. Continue small, frequent feedings.  5. Will see [name] again every 6-8 weeks to monitor [name] weight and      general condition.  6. Refilled for omeprazole 20 mg daily, called to [facility].     Hedwig Morton. Juanda Chance, MD  Electronically Signed    DMB/MedQ  DD: 12/11/2006  DT: 12/12/2006  Job #: [id number]   cc:   Leonie Man, M.D.  Leanne Chang, M.D.

## 2010-08-01 NOTE — Consult Note (Signed)
Becky, Ross               ACCOUNT NO.:  [id number]   MEDICAL RECORD NO.:  [id number]          PATIENT TYPE:  OBV   LOCATION:  1403                         FACILITY:  [facility]   PHYSICIAN:  Colleen Can. Deborah Chalk, M.D.DATE OF BIRTH:  December 15, 1945   DATE OF CONSULTATION:  02/12/2007  DATE OF DISCHARGE:                                 CONSULTATION   REFERRING PHYSICIAN:  Leanne Chang, M.D.   Thank you very much for asking [place] see [name].  She is a 65 year old  female with a history of left bundle branch block.  She came to cardiac  catheterization in January 2008, because of an abnormal stress  Cardiolite study.  At the time of catheterization.  She had mild  coronary atherosclerosis and mild decrease in ejection fraction,  approximately 50%, and mild septal hypokinesia.  She presents with a  lone episode of syncope without warning.  She was standing at her sink  in her kitchen and suddenly fell with a complete loss of postural tone.  She landed on her left shoulder and has a minimally displaced left  humeral fracture.  She awoke when she hit the floor and has not had  further episodes.   Her past medical history is that of the left bundle branch block.  She  had catheterization in January of 2008.  She had a left main coronary  artery that was normal.  Left anterior descending had calcification of  proximal 1/3 with 20%-30% ostial narrowing and scattered 20%-30% lesions  proximally to the first diagonal.  The left circumflex was a large  vessel.  It has minor irregularities.  The right coronary artery is a  small but dominant vessel.  It was normal.  The left ventricular  angiogram was performed in the LAO and RAO projection.  There was septal  hypokinesia with a global ejection fraction estimated to be 50%.   She has a history of bladder surgery in February of 2008.  She has  chronic small bowel obstruction.  She has had a chronic PIC line  placement for management of her  small bowel obstruction.  She has a  history of fibromyalgia.  She has history of hypertension, degenerative  joint disease, hysterectomy in 1987, remote tonsillectomy and history of  anemia with periodic iron transfusions.   ALLERGIES:  None.   CURRENT MEDICATIONS:  1. Cardura 4 mg a day.  2. Gabapentin 670 mg 4 times a day.  3. Verelan 100 mg per day.  4. Endocet 4 per day.  5. Clonazepam 0.5 mg 2 at bedtime.  6. Restoril p.r.n.  7. Celebrex 2 per day.  8. Elavil p.r.n.  9. Flexeril.  10.Phenergan.   FAMILY HISTORY:  Father is living with a history of coronary disease and  ICD in place.  Mother died at age 70 of acute CVA.   SOCIAL HISTORY:  She is disabled.  No smoking over 20 years.  She lives  with her husband and with adopted children.   REVIEW OF SYSTEMS:  No prior syncopal episode.  She has had more trouble  with her bowel  obstruction recently with increasing vomiting, but that  was not a presenting symptom and she has only had symptoms perhaps 4  hours earlier.   EXAM:  GENERAL:  She is in no acute distress.  A sling is in place on  left arm.  VITAL SIGNS:  Blood pressure of 110/68, heart rate 80.  SKIN:  Is warm and dry.  Color is normal.  LUNGS:  Clear.  HEART:  Shows regular rate and rhythm.  S4 gallop.  No murmur.  ABDOMEN:  Is negative.  EXTREMITIES:  Without edema.  Left shoulder is in the sling.  NEUROLOGICALLY:  She is intact.   Pertinent lab:  Hematocrit 34, white count 6000, sodium is 138,  potassium is 3.9, chloride is 102, CO2 is 30, BUN is 17, creatinine 0.8,  glucose is 84.  CK and troponin are negative.  EKG shows left bundle  branch block with a sinus rhythm.   CT scan of the head is negative.   X-RAYS:  Shoulder shows minimally displaced left humeral fracture.   OVERALL IMPRESSION:  1. Syncope,  2. Left bundle branch block.  3. History of minimal coronary atherosclerosis.   PLAN:  Will rule out myocardial infarction.  Will watch on  telemetry.  Will monitor over the next 24 to 36 hours.  She will need an event  monitor.  There will be no driving.  There may be the question of a  pacemaker, but we will plan on watching over Thanksgiving and  discharging her on Friday morning with plans to follow up in our office  and get a monitor placed at that point in time.  Other pertinent  information has shown that an echocardiogram was done in January of  2008, which shows findings consistent with left bundle branch block, but  with only mild mitral and tricuspid regurgitation and mild LV systolic  function with impaired LV relaxation.      Colleen Can. Deborah Chalk, M.D.  Electronically Signed     SNT/MEDQ  D:  02/12/2007  T:  02/13/2007  Job:  [id number]   cc:   Leanne Chang, M.D.  Fax: [phone]

## 2010-08-01 NOTE — Assessment & Plan Note (Signed)
[name]                         GASTROENTEROLOGY OFFICE NOTE   NAME:HODGESLaray, Rivkin                      MRN:          [id number]  DATE:08/16/2006                            DOB:          1945/03/30    [name] is a 65 year old white female with chronic partial small  bowel obstruction due to adhesions due to multiple surgeries.  She  transferred from Dr. Randa Evens' care to [place] in the fall of 2007 and I saw  her only on one occasion on December 28, 2005.  She now comes for  followup.  Dr. Blossom Hoops has requested that we take of her PICC line  since it is connected to her underlying problem of small bowel  obstruction.  The patient has had a PICC line initially in the right arm  for 2 years for home IV hydration during attacks of small bowel  obstruction for the past . The PICC line was switched to her left arm  and has been functioning very well.  Her husband flushes it daily with  heparin.  She cleans it and [facility] comes to deliver normal  saline bags and redresses  the PICC line, which has been  used because  of poor venous access.  The patient has not had to use her  PICC line  recently for intravenous fluids .  She usually has 1 or 2 bags of 1000  mL of normal saline at home.  She may go 1 or 2 months without using IV  fluids and then may use IV fluids  several days in succession.  Use of  home IV has helped her to stay out of the hospital.  She would like to  continue the PICC line because of possible emergency IV access that may  be necessary in case she has a clinically significant obstruction.  As  the result of the small bowel obstruction, she has had increased  gastroesophageal reflux, not on a daily basis, but only at times.  Her  weight in general has been stable at around 100 pounds.   MEDICATIONS:  1. Celebrex 200 mg p.o. b.i.d. for fibromyalgia.  2. Cardura 4 mg p.o. daily.  3. Temazepam p.r.n.  4. Cyclobenzaprine t.i.d.  p.r.n.  5. Neurontin 600 mg q.i.d.  6. Endocet 10/325 mg b.i.d.  7. Sanctura 20 mg p.o. b.i.d.  8. Mirapex 0.125 mg p.o. nightly.  9. Amlodipine 5 mg p.o. daily.  10.Fentanyl patch 50 mcg q. 72 hours.   OTHER MEDICAL PROBLEMS:  Fibromyalgia, status post recent bladder  repair, iron deviancy anemia.   PHYSICAL EXAMINATION:  Blood pressure 114/68, pulse 80 and weight 98.2  pounds.  She was alert and oriented, no distress.  LUNGS:  Clear to auscultation.  COR:  Normal S1, normal S2.  ABDOMEN:  Soft with well healed vertical and horizontal surgical scars  through her abdomen with a hyperactive bowel sounds and abnormal rushes,  as well as increased tympanum in upper abdomen.  There was no  significant distension and there was no tenderness.  RECTAL:  Shows normal to somewhat decreased rectal tone, no  stool in the  rectal ampulla, mucus was hemoccult negative.  The patient was  complaining of leakage of small amount of stool at times.   IMPRESSION:  62. A 65 year old white female with partial small bowel obstruction in      mid small bowel as demonstrated on recent small bowel follow      through in January 2008, currently stable.  2. PICC line, left arm for home, IV therapy, which has not been used      very often.  The patient continues to flush it and maintain it with      help of home care.  3. Gastroesophageal reflux secondary to small bowel obstruction.  4. Fibromyalgia.  5. Chronic pain syndrome.   PLAN:  1. Continue PICC line as such.  I will take over the orders from home      care as far as the IV therapy is concerned.  If she does not use      her PICC line more than several times a year, I do not think it is      worth keeping because of the risk associated with possible      bacteremia.  We will see over a period of next year how often she      would use it.  2. Renal profile today.  3. Serum albumin to assess her nutritional status.  4. Prilosec 20 mg, dispensed 30  by electronic prescribing.  I will see      her in 6 months.     Becky Morton. Juanda Chance, MD  Electronically Signed    DMB/MedQ  DD: 08/16/2006  DT: 08/16/2006  Job #: [id number]   cc:   Leanne Chang, M.D.

## 2010-08-01 NOTE — Op Note (Signed)
NAMETIPPI, MCCRAE               ACCOUNT NO.:  [id number]   MEDICAL RECORD NO.:  [id number]          PATIENT TYPE:  AMB   LOCATION:  [facility]                         FACILITY:  [facility]   PHYSICIAN:  Martina Sinner, MD DATE OF BIRTH:  Feb 03, 1946   DATE OF PROCEDURE:  DATE OF DISCHARGE:                               OPERATIVE REPORT   PREOPERATIVE DIAGNOSIS:  Right hydronephrosis.   POSTOPERATIVE DIAGNOSIS:  Right hydronephrosis.   PROCEDURE:  Cystoscopy, right retrograde urethrogram plus bladder plus  ureteral cytology.   [name] has hydronephrosis and hydroureter nearly to the level  of the urethrovesical junction based upon a CT scan.  She has good  function, no pain and no filling defects on CT scan.   From a diagnostic standpoint, the above procedure was scheduled.  She  was given preoperative ciprofloxacin.   A 21-French scope was utilized.  Bladder mucosa and trigone were normal.  There was no stitch, foreign body or carcinoma.   Fluoroscopically, she was in good position.  I could see the InterStim  leads.  Ureteral orifices looked normal.  Cone tip catheter was  utilized.  A retrograde ureterogram utilizing low-volume was performed.   Retrograde ureterogram:  Approximately 5 mL of contrast was instilled  and in my opinion, she had a normal caliber pelvic ureter with mild to  moderate dilation with an S turn near the upper aspect of the sacral  iliac joint.  It then turned laterally to a mild to moderately dilated  ureter and renal calyces.  I did not tried to fill the upper tract since  there was no need to and I did not want to inject under pressure.   I removed the cone tip catheter and then dye immediately evacuated.   I repeated the same procedure using an open-end ureteral catheter to the  level of the upper pelvic ureter easily placed fluoroscopically.  The  ureter looked much straighter, and there was no filling defect, hour  glass effect or acute  narrowing.   I then switched over to normal saline.  I decided to do a saline bladder  wash, and this was performed.  I ran through cystoscopic fluid before  doing this.  Fluoroscopically, there was no dye in her ureters.  I  passed an open-end ureteral catheter fluoroscopically up to the upper  pelvic ureter and did a gentle ureteral irrigation with saline.  Both  specimens were sent for cytology separately.  I repeated the retrograde,  and again there was no filling defect, acute tapering or hour glass  deformity.  Within seconds, the ureter drained fluoroscopically and  cystoscopically.   In my opinion the patient did not need ureteroscopy.  She was sent to  the recovery room after the bladder was emptied.           ______________________________  Martina Sinner, MD  Electronically Signed     SAM/MEDQ  D:  11/11/2008  T:  11/11/2008  Job:  [id number]

## 2010-08-01 NOTE — Assessment & Plan Note (Signed)
[facility]                        [facility] OFFICE NOTE   NAME:[name], [name]                      MRN:          [id number]  DATE:07/18/2006                            DOB:          June 05, 1945    REASON FOR VISIT:  Wants to change medication to generic.   [name] is a pleasant female presenting here to discuss change of  medications.  She is currently on Verelan PM 100 mg daily for  hypertension and she would like to change to generic due to cost.  Additionally, she is on Celebrex for chronic pain of osteoarthritis.   Overall, [name] has been doing well and taking her medications  regularly.  She does live with chronic pain, but at least is able to do  activities of daily living with the current dosage.   She also has a history of recurrent small bowel obstruction secondary to  adhesion followed by Dr. Juanda Chance.  The patient did advise me that she  will schedule followup with Dr. Juanda Chance in the near future.  She has no  other complaints today.  Denies any side effects from medications.   MEDICATIONS:  Please see med list.   ALLERGIES:  SULFUR.   OBJECTIVE:  Weight 96.2, temperature 98.1, pulse 78, respiratory rate of  14, blood pressure 160/90.  IN GENERAL:  We have a pleasant female, alert and oriented x3.  NECK:  Supple, no lymphadenopathy, carotid bruits, JVD.  LUNGS:  Clear.  HEART:  Regular rate and rhythm, normal S1, S2.  No murmurs, gallops, or  rubs heard on my examination.  EXTREMITIES:  No cyanosis, clubbing, or edema.   IMPRESSION:  1. Hypertension, currently on Verelan PM 100 mg daily with Cardura 4      mg daily.  2. Chronic pain and fatigue.  3. Degenerative disk disease and osteoarthritis.   PLAN:  1. Patient will be switched over to Norvasc 5 mg daily.  She will      discontinue Verelan PM.  She was advised to monitor her blood      pressure regularly and to follow up with me in three weeks, or      sooner  if her blood pressure is elevated.  Of note, patient reports      that her blood pressure has been checked weekly by the nurse and      has run in the 120s to upper 70s to 80.  2. We will also check a BMET.  3. In regards to the Celebrex, I advised the patient there is no      generic to Celebrex or anything      comparable in generic version.  Patient is okay with that.  4. Further recommendations after lab review.     Leanne Chang, M.D.  Electronically Signed    LA/MedQ  DD: 07/18/2006  DT: 07/18/2006  Job #: [id number]

## 2010-08-04 NOTE — Consult Note (Signed)
[facility]  Patient:    [name], [name] Visit Number: [id number] MRN: [id number]          Service Type: MED Location: [phone] Attending Physician:  Gracelyn Nurse Dictated by:   Angelia Mould. Derrell Lolling, M.D. Proc. Date: 03/30/01 Admit Date:  03/30/2001   CC:         Chales Salmon. Abigail Miyamoto, M.D.  Lowell C. Catha Gosselin, M.D.  James L. Randa Evens, M.D.   Consultation Report  REASON FOR CONSULTATION:  Evaluate acute and chronic abdominal pain.  HISTORY OF PRESENT ILLNESS:  This is a 65 year old white female who has lots of chronic GI problems.  She came to the emergency room this evening complaining of a 24-hour history of mostly constant left-sided abdominal pain and vomiting.  Really no change in her bowel habits.  She has one to two soft to loose stools per day and has been doing that for some time.  She also notes that she has chronic abdominal pain and chronic intermittent nausea and vomiting.  She has had a fairly extensive work-up as an outpatient with Fayrene Fearing L. Randa Evens, M.D.  She is being admitted by the [facility].  PAST MEDICAL HISTORY:  She underwent a subtotal colectomy by Anselm Pancoast. Zachery Dakins, M.D., in November of 2001 for "chronic constipation."  She has had three laparotomies by Levan Hurst, M.D., for "adhesions" in the past.  She has had three pelvic and gynecologic surgeries in the past, ultimately resulting in a total hysterectomy and bilateral salpingo-oophorectomy.  She had a CT scan last month in the [facility] Emergency Room, which apparently showed a right hydronephrosis, and the patient states she is unaware of that.  She has fibromyalgia.  She has anemia and has been worked up by [employer]. Catha Gosselin, M.D., and had bone marrow biopsy recently.  She has hypertension.  She has arthritis.  She has had a right shoulder surgery.  CURRENT MEDICATIONS: Zestril, hydrochlorothiazide, amitriptyline, ______ (presumably a  benzodiazepine), Flexeril, OxyContin, and Ambien.  DRUG ALLERGIES:  None known.  PHYSICAL EXAMINATION:  This is a thin, pleasant, pale, white female in minimal distress.  VITAL SIGNS:  Temperature 98.7 degrees, respiratory rate 22, initial heart rate 112, down to 85 at my exam, blood pressure 164/91.  HEENT:  Sclerae clear.  Extraocular movements intact.  NECK:  Supple.  Nontender.  No mass.  LUNGS:  Clear to auscultation.  HEART:  Regular rate and rhythm at a rate of 85.  No murmur.  ABDOMEN:  Active bowel sounds.  Questionably distended.  She has multiple scars, but no hernia and no palpable mass.  Exam is variable.  She has diffuse variable tenderness.  No localizing findings.  She has voluntary guarding, which is inconsistent on repeated exam.  ADMISSION LABORATORY DATA:  Abdominal x-rays showed nonspecific bowel gas pattern.  This does not look like a bowel obstruction.  Laboratory work shows a hemoglobin of 10.7 and a white count of 8800. Amylase 54.  The comprehensive metabolic panel is normal.  The albumin is 3.4.  IMPRESSION: 1. Acute on chronic abdominal pain.  Question adhesions.  Question ileus.    Doubt high-grade small bowel obstruction. 2. Status post subtotal colectomy for presumable motility disorder. 3. Right hydronephrosis by CT scan of uncertain etiology. 4. Anemia.  PLAN: 1. The patient will be admitted by the [facility].  She will be placed    NPO.  IV fluids will be given to rehydrate her. 2. I would  suggest non-narcotic analgesics if possible to minimize the    narcotic effect on any motility disorder. 3. Will repeat abdominal x-rays and laboratory work tomorrow morning. 4. I would ask James L. Randa Evens, M.D., from gastroenterology to see the    patient regarding his opinion and insights into her care. 5. The patient states that she is scheduled to see Luisa Hart L. Lurene Shadow, M.D., on    April 07, 2001, for another surgical opinion and she is  advised that he    is out of town for one week. 6. I will follow up in the morning. Dictated by:   Angelia Mould. Derrell Lolling, M.D. Attending Physician:  Marcelino Duster D DD:  03/30/01 TD:  03/31/01 Job: [id number]

## 2010-08-04 NOTE — Op Note (Signed)
NAMEEMMAROSE, KLINKE               ACCOUNT NO.:  [id number]   MEDICAL RECORD NO.:  [id number]          PATIENT TYPE:  AMB   LOCATION:  DAY                          FACILITY:  [facility]   PHYSICIAN:  Martina Sinner, MD DATE OF BIRTH:  [birth year]   DATE OF PROCEDURE:  06/05/2006  DATE OF DISCHARGE:                               OPERATIVE REPORT   PRE AND POSTOP DIAGNOSIS:  Refractory urge incontinence and overactive  bladder.   SURGERY:  InterStim second stage implant plus impedance check.   SURGEON:  Martina Sinner, MD   Montez Morita has refractory urge incontinence that responded  beautifully to the first stage implant.   The patient is prepped and draped in usual fashion.  Given preoperative  antibiotics.  We prepped her in such a way that I separated the foreign  body that was exiting near the middle of her back from the rest of the  incision.   I opened the incision without cutting the Medtronic InterStim lead.  I  made the incision approximately 4.5 cm in length.  She had a 1.5 to 2 cm  pocket without any bleeding.  I easily delivered the lead in the lead  connector.  I cut both silk sutures and using a screwdriver, removed the  boot and disconnected the lead connector from the lead itself.  The lead  was intact.  It was gently placed through the new Medtronic smaller  generator.  Screwdriver was utilized to connect.   The generator was then placed in the pocket with the Medtronic label  facing upward.   We then did the impedance checked.  All positions were checked.  Everything was in order.   Copious irrigation was used with double antibiotic.   A running 3-0 Vicryl was used for the subcuticular tissue.  Running 4-0  subcuticular suture with 4-0 Vicryl was utilized.  Steri-Strip dressing  with Telfa and OpSite was utilized.   Ms. Irving Burton was done beautifully with InterStim.  I hope she continues to  reach her treatment goal.  I will follow her as  protocol.           ______________________________  Martina Sinner, MD  Electronically Signed     SAM/MEDQ  D:  06/05/2006  T:  06/05/2006  Job:  [id number]

## 2010-08-04 NOTE — Discharge Summary (Signed)
[facility]  Patient:    [name], [name] Visit Number: [id number] MRN: [id number]          Service Type: SUR Location: 4W [id number] Attending Physician:  Meredith Leeds Dictated by:   Anselm Pancoast. Zachery Dakins, M.D. Admit Date:  02/26/2001 Discharge Date: 03/03/2001                             Discharge Summary  INCOMPLETE  DISCHARGE DIAGNOSES: 1. Chronic abdominal pain. 2. History of excessive narcotic use. 3. Previous intestinal obstruction secondary to intestinal adhesions. 4. Questionable history of fibromyalgia.  HISTORY OF PRESENT ILLNESS:  The patient is a 64 year old female who was referred to the emergency room from [facility] office with the diagnosis of probable small bowel obstruction.  The patient previously was managed by Dr. Woodfin Ganja with several operations and I have operated on her twice approximately two or three years ago with a bowel obstruction that was slow to resolve, and then, basically, a total intra-abdominal colectomy approximately a year ago for a chronic problem with severe constipation.  The patient since then has been followed, I think, by Dr. Abigail Miyamoto, her primary physician there, and I am not sure who saw her when they referred her to the hospital on December 11.  Dr. Orson Slick was on call and he admitted her after abdominal films questioned ______ air fluid levels greater on the left than right.  When he admitted her, he did not note that she was presently taking Dictated by:   Anselm Pancoast. Zachery Dakins, M.D. Attending Physician:  Meredith Leeds DD:  03/25/00 TD:  03/25/01 Job: [id number]

## 2010-08-04 NOTE — Op Note (Signed)
[name], [name]               ACCOUNT NO.:  [id number]   MEDICAL RECORD NO.:  [id number]          PATIENT TYPE:  AMB   LOCATION:  [facility]                         FACILITY:  [facility]   PHYSICIAN:  Martina Sinner, MD DATE OF BIRTH:  13-Oct-1945   DATE OF PROCEDURE:  05/28/2006  DATE OF DISCHARGE:  05/28/2006                               OPERATIVE REPORT   SURGEON:  Martina Sinner, MD   ASSISTANT:  Cornelious Bryant, MD.   PREOP DIAGNOSIS:  Refractory urge incontinence and overactive bladder.   POSTOP DIAGNOSIS:  Refractory urge incontinence and overactive bladder.   SURGERY:  InterStim stage I plus fluoroscopy.   HISTORY:  [name] has refractory urge incontinence.  She has failed  injectables and behavioral medical therapy.   DESCRIPTION OF PROCEDURE:  The patient is prepped and draped in the  usual fashion.  She was given MAC anesthesia; and was quite sedated  throughout the case.  The anesthesia was very well.  She was placed in  the prone position with rolls underneath her chest and hip.  She was a  little bit tilted, to her left, but things went fine using fluoroscopy.  She is very thin.  A 3.5 foramen needle was easily passed through her S3  foramina on the right.  We tested it; and she had nice bellows and toe  response.  Fluoroscopically it looked a little bit low, so I placed one  just above, but there is no question that we got a n S2 foot response.  I was comfortable that the first lead was in S3.   I removed the center needle of the foramen needle and passed the  guidewire down fluoroscopically to the first line.  I removed the  foramen needle.  I made a 1-cm incision around the wire.  I then placed  a white trocar over the guide fluoroscopically down through the bone  table.  I removed its inner sheath and passed the lead through it.  I  passed the lead down to the first white line.  It was obvious that we  had to bring had to bring the white sheath in  deeper, and I did this  sequentially, 2-3 times until the lead was in a good position  fluoroscopically.  I then brought the sheath back to the second dot  exposing the four leads.  She had excellent bellows and toe response in  position 3 and 4, but not on 0 and 1.  She was minimal on position 1.  I  spent a lot of time changing his position, but finally I thought we  would leave it as is since she had a good motor response.   An AP and lateral view was taken and I  got hard copies.  The x-ray is  not completely accurate due to the angle of the C-arm relative to the  angle of her torso.  I then made a 3-cm skin incision two fingerbreadths  above the left iliac crest.  I made a subcutaneous pocket after  injecting 10 mL of lidocaine and epinephrine.  I then used  lidocaine/epinephrine to anesthetize a tunnel between the two incisions.  The lead passer was utilized.  The lead was passed, once the lead passer  was brought out through both incisions.   With the lead laying nicely in place, a boot was put over the lead; and  then using a screwdriver, it was attached to the lead connector; and it  was brought out through a central puncture incision.  After  anesthetizing the subcutaneous tissue, once again, using the lead  passer.  Copious irrigation was utilized.  A 4-0 Vicryl was used for the  1-cm initial incision over the sacrum.  A 3-0 Vicryl and 4-0 Vicryl was  used for the 3 cm incision in her left hip.  Dermabond was applied.  Dermabond followed by light Telfa and Tegaderm dressing was used over  the exiting lead.   The patient was given perioperative Ancef.  She will be followed in  clinic by phone.  I gave her Keflex, Vicodin, and Colace.           ______________________________  Martina Sinner, MD  Electronically Signed     SAM/MEDQ  D:  05/28/2006  T:  05/30/2006  Job:  [id number]

## 2010-08-04 NOTE — Letter (Signed)
February 01, 2006     RE:  ZAHARAH, AMIR  MRN:  [id number]  /  DOB:  1946/03/12   To whom it may concern:   Ms. Heinrich is a patient at [facility] who was recently asked to  participate in jury duty.  I am writing this letter on behalf of Ms.  Vollmer in hopes that she can be excused from this obligation.  Ms.  Capshaw has significant chronic medical problems that would compromise  her ability to perform this duty.  If you have any questions, please do  not hesitate to call our office.    Sincerely,      Leanne Chang, M.D.  Electronically Signed    LA/MedQ  DD: 02/01/2006  DT: 02/01/2006  Job #: [id number]

## 2010-08-04 NOTE — Op Note (Signed)
[facility]  Patient:    [name], [name] Visit Number: [id number] MRN: [id number]          Service Type: MED Location: 1W 0162 01 Attending Physician:  Sonda Primes Dictated by:   Lindaann Slough, M.D. Proc. Date: 05/20/01 Admit Date:  05/07/2001   CC:         Luisa Hart L. Lurene Shadow, M.D.  Chales Salmon. Abigail Miyamoto, M.D.   Operative Report  PREOPERATIVE DIAGNOSIS:  Small bowel obstruction.  POSTOPERATIVE DIAGNOSIS:  Small bowel obstruction.  PROCEDURE:  Cystoscopy and insertion of bilateral ureteral catheters.  SURGEON:  Lindaann Slough, M.D.  ANESTHESIA:  General.  INDICATIONS:  The patient is a 65 year old female who is scheduled for exploratory laparotomy by Dr. Lurene Shadow and Dr. Lurene Shadow has requested that ureteral catheters be inserted before surgery. She is scheduled for the procedure.  DESCRIPTION OF PROCEDURE:  After general anesthesia, the patient was prepped and draped and placed in the dorsal lithotomy position. A #22 Wappler catheter cystoscope was inserted in the bladder. The bladder mucosa is normal. There is no stone or tumor in the bladder. The ureteral orifices are in normal position and shape with clear efflux. Then a #6-French open end catheter was passed over a glidewire into the right ureteral orifice and advanced up into the renal pelvis. The same procedure was done on the left side.  Then, a #16 Foley catheter was inserted in the bladder.  The patient tolerated the procedure well and was left in dorsal lithotomy position for surgery by Dr. Lurene Shadow. Dictated by:   Lindaann Slough, M.D. Attending Physician:  Sonda Primes DD:  05/20/01 TD:  05/21/01 Job: [id number]

## 2010-08-04 NOTE — Discharge Summary (Signed)
[place]  Patient:    [name], [name]                      MRN: [id number] Adm. Date:  [id number] Disc. Date: [id number] Attending:  Henrene Dodge CC:         Anselm Pancoast. Zachery Dakins, M.D.                           Discharge Summary  DISCHARGE DIAGNOSES: 1. Chronic constipation. 2. History of intestinal adhesions.  OPERATIONS:  None.  HISTORY OF PRESENT ILLNESS:  [name] is a 65 year old Caucasian female who previously was a patient of Dr. Woodfin Ganja.  She had several operations for adhesions, and I have seen her on several occasions when she was admitted extremely constipated.  This appears to be a chronic problem in spite of chronic laxatives, and her last admission, I think, was around Christmas time.  PHYSICAL EXAMINATION:  GENERAL:  She is thin, and presented to the emergency room on April 27, 1999.  VITAL SIGNS:  Temperature was normal at 97.5, pulse of 110, blood pressure 166/97.  On exam, she had palpable stool in the left colon, kind of up into the transverse colon, and this was a lot less than when I have admitted her previously, but obviously more than can be managed as an outpatient.  MEDICATIONS:  She is on Premarin and Altace and Vioxx b.i.d.  HOSPITAL COURSE:  We admitted her.  She had a couple of enemas with the expelling of a large amount of stool.  After that, the nausea was subsided, and she was started on full liquids, and then a follow-up flat and upright abdominal x-ray was performed on Saturday.  She was seen by the surgeon on call at that time, and the x-rays were consistent with resolving small-bowel obstruction.  She was continued on full liquids, and then the following day was seen by Dr. Derrell Lolling, he was on call, and had good bowel sounds and had a bowel movement, and he discharged her on a full liquid diet with instructions to see me in the office in approximately a week.  This problem is one  that we have treated with laxatives, which she certainly needs to continue.  I do not think that any colon exam or other studies are needed since they have been studied repeatedly in the past.  She will be followed in the office as instructed.DD:  06/12/99 TD:  06/13/99 Job: [id number]

## 2010-08-04 NOTE — Op Note (Signed)
[facility]  Patient:    [name], [name] Visit Number: [id number] MRN: [id number]          Service Type: MED Location: 1W [phone] Attending Physician:  Sonda Primes Dictated by:   Mardene Celeste. Lurene Shadow, M.D. Proc. Date: 05/20/01 Admit Date:  05/07/2001                             Operative Report  PREOPERATIVE DIAGNOSIS:  Partial small bowel obstruction status post multiple abdominal operations.  POSTOPERATIVE DIAGNOSIS:  Partial small bowel obstruction status post multiple abdominal operations.  PROCEDURE:  Exploratory laparotomy, adhesiolysis, small bowel resection of small bowel stricture, gastrostomy and insertion of new central line.  INDICATIONS FOR PROCEDURE:  This patient is a 65 year old woman admitted to the hospital on February 19 with severe crampy abdominal pain, nausea and vomiting. She has been admitted on several occasions before for the same situation. She has had partial abdominal colectomy and multiple operations for adhesiolysis as well as a total abdominal hysterectomy and bilateral salpingo-oophorectomy. Studies show a partial bowel obstruction retrograde barium through the rectum cannot go beyond a region in the left lower quadrant. The patient is advised of the risks and potential benefits of surgery and gives her consent and brought to the operating room now for exploration. Prior to abdominal exploration, ureteral catheters were placed in the ureters bilaterally by Dr. Su Grand.  DESCRIPTION OF PROCEDURE:  Following the induction of a satisfactory general anesthetic with the patient positioned supine, the abdomen is prepped and draped to be included in the sterile operative field. A midline incision is reopened upon entering the abdomen multiple adhesions of the abdominal wall midline and to the lateral wall were encountered and taken down serially. These were very thick fibrous adhesions. The more proximal and  dilated bowel was dissected down into the left lower quadrant where the decompressed bowel was noted. The area at the compressed bowel was opened and a finger inserted into the bowel where a stricture was encountered. This entire segment was dissected free. The small bowel here was resected between clamps and the mesentery secured with ties of 2-0 silk. I then accomplished a hand sewn end-to-end anastomosis in two layers using 3-0 Vicryl sutures on the inner layer and interrupted Lembert silk sutures on the outer layer. Following this, the anastomosis was tested and noted to be widely patent. The mesenteric defect closed with interrupted silk sutures.  I then turned attention to the stomach where a gastrostomy tube was placed by placing two pursestring sutures in the anterior gastric wall ______  a gastrotomy and inserting a modified size 20 French mushroom catheter into the stomach. The pursestring sutures were taut and tied inverting the inner suture line by the outer suture line. The stomach was then sutured to the anterior abdominal wall through which the gastrostomy tube was brought. This was done with 2-0 silk sutures. All the areas of dissection were then checked for hemostasis. We specifically did not try to take down any of the gnarled adhesions found in the small bowel and most of this had comprised somewhat dilated bowel and thought to be patent. Sponge, instrument and sharp counts were then doubly verified and the wound thoroughly irrigated with multiple aliquots of normal saline. The midline wound was then closed with a running suture of #1 Novofil. The subcutaneous tissue was irrigated and skin closed with staples. The gastrostomy tube was secured to the  abdominal wall with a Novofil suture.  I then inserted a new central venous catheter by first prepping her left shoulder and then making a subclavian ______ in the left subclavian region and inserting a guidewire which was  threaded into the central venous system. The catheter was then threaded over the guidewire and the guidewire removed. Good return of blood was noted through all three ports. The ports were then flushed with heparinized saline and the central venous catheter secured to the skin with 3-0 nylon sutures. The anesthetic was reversed, the patient removed from the operating room to the recovery room in stable condition. She tolerated the procedures well. Dictated by:   Mardene Celeste. Lurene Shadow, M.D. Attending Physician:  Sonda Primes DD:  05/20/01 TD:  05/21/01 Job: [id number]

## 2010-08-04 NOTE — Discharge Summary (Signed)
[facility]  Patient:    [name], [name] Visit Number: [id number] MRN: [id number]          Service Type: MED Location: 3W [phone] Attending Physician:  Sonda Primes Dictated by:   Mardene Celeste. Lurene Shadow, M.D. Admit Date:  05/07/2001 Discharge Date: 05/31/2001   CC:         Chales Salmon. Abigail Miyamoto, M.D.   Discharge Summary  ADMITTING DIAGNOSIS:  Partial small bowel obstruction.  DISCHARGE DIAGNOSIS:  Partial small bowel obstruction with small bowel stricture.  PROCEDURES IN THE HOSPITAL:  Central venous catheters x 2.  CT scans of the abdomen showing a partial small bowel obstruction, ______ of the contrast enema which is also consistent with small bowel obstruction in that the contrast would not progress retrograde.  She was also treated in the hospital with central TNA and ______ with exploratory laparotomy with adhesiolysis and small bowel resection.  There were no complications.  Discharge condition is improved.  She had gastrostomy tube placement.  HOSPITAL COURSE:  This patient is a 65 year old who is status post multiple abdominal operations in the past for bowel obstructions.  She most significantly had a near total abdominal colectomy for bowel obstruction and for chronic and persistent constipation.  On this admission, she came to the hospital complaining of severe abdominal cramping pain, nausea, and vomiting. Following admission, she was treated first with nasogastric decompression. The subsequent workup including CT scans of the abdomen which indeed did confirm small bowel obstruction and Gastrografin enema which also showed an area of obstruction.  She was treated with central TNA after insertion of central lines.  These were changed about halfway through her hospital course and, then, following this, she was taken to the operating room May 20, 2001, after a central TNA had gotten her nutritional status up to par.  Her prealbumin  just preoperatively had gone from 7 to 27.  Following exploratory laparotomy, adhesiolysis, and small bowel resection, the postoperative course has been benign.  She was continued on TNA until one day prior to discharge. At the time of discharge, her oral intake is excellent, taking approximately 1800 to 2000 calories per day.  Bowel activity is normalizing.  She has approximately two loose stools per day.  There is no abdominal cramping or abdominal distention.  She is being discharged now to be followed up in the office in one week of removal of a gastrostomy tube.  She will see me in approximately one week for removal of her gastrostomy tube.  MEDICATIONS:  Tylox one to two every 4 to 6 hours p.r.n. for pain.  She is to continue on her Zyrtec and Phenergan as needed.  ACTIVITY AND DIET:  Unrestricted. Dictated by:   Mardene Celeste. Lurene Shadow, M.D. Attending Physician:  Sonda Primes DD:  05/30/01 TD:  05/31/01 Job: [id number]

## 2010-08-04 NOTE — Cardiovascular Report (Signed)
[name], [name]               ACCOUNT NO.:  [id number]   MEDICAL RECORD NO.:  [id number]          PATIENT TYPE:  OIB   LOCATION:  1962                         FACILITY:  [facility]   PHYSICIAN:  Colleen Can. Deborah Chalk, M.D.DATE OF BIRTH:  04-24-1945   DATE OF PROCEDURE:  04/08/2006  DATE OF DISCHARGE:                            CARDIAC CATHETERIZATION   HISTORY:  [name] presents for a preoperative evaluation.  She had a  left bundle branch block.  She had a 2 D echocardiogram which showed  septal hypokinesia and a defect by stress Cardiolite study.  With that,  she was referred for catheterization.   PROCEDURES:  Left heart catheterization with selective coronary  angiography, __________ , and biplane left ventricular angiography.   TYPE AND SITE OF ENTRY:  Percutaneous right femoral artery.   CATHETERS:  #4 [place] 4 curved Judkins right and left coronary  catheters, #4 French pigtail ventriculographic catheter.   CONTRAST:  Pure Omnipaque.   MEDICATIONS PRIOR TO PROCEDURE:  None.   MEDICATIONS DURING THE PROCEDURE:  Versed 2 kg IV.   COMMENTS:  The patient tolerated the procedure well.   ANGIOGRAPHIC DATA:  1. Left main coronary artery:  Normal.  2. Left anterior descending:  Left anterior descending has      calcification in the proximal one-third.  There is a 20-30% ostial      narrowing.  There is scattered 20-30% lesion proximally and into      the first diagonal..  There is no significant obstructive disease      present.  The left anterior descending wraps around the apex.  3. Left circumflex:  The left circumflex is a large vessel.  It gives      a large obtuse marginal.  It has minor irregularities.  No      obstructive lesions are present.  4. Right coronary artery:  The right coronary artery is a small,      dominant vessel.  It is __________ .  5. Left ventricular angiogram performed in the LAO and RAO      projections.  There was septal hypokinesia, but he  global ejection      fraction would be estimated to be 50%.  There was no mitral      regurgitation noted.  There was a somewhat vertical __________      with the apex pointing anteriorly.   OVERALL IMPRESSION:  1. Mild septal hypokinesia with mild global reduction in ejection      fraction.  2. Mild coronary atherosclerosis primarily in the proximal left      anterior descending with coronary calcification but no significant      focal obstructive disease is present.   DISCUSSION:  We will allow [name] to have her planned bladder  surgery.  I think she is at low risk.  We will try to stress issues of  cholesterol management after her surgery and continue follow this issue  with observation periodically.      Colleen Can. Deborah Chalk, M.D.  Electronically Signed     SNT/MEDQ  D:  04/08/2006  T:  04/08/2006  Job:  [id number]

## 2010-08-04 NOTE — Assessment & Plan Note (Signed)
[facility]                          [place] OFFICE NOTE   NAME:[name], [name]                      MRN:          [id number]  DATE:11/20/2005                            DOB:          [birth year]    [name] is a 65 year old female establishing here from [place] at [facility].  She needs refills on her current medications.   According to [name] and her husband, they were both dismissed from  [place].  According to her husband, [name] was sick with an upper  respiratory infection and he decided to ask one of his friends that happened  to be a pharmacist for amoxicillin.  The pharmacist provided him amoxicillin  without a physician prescription and this has led to both of them being  dismissed from [place].  [name] was very repentant about the situation.  The pharmacist is under review by the medical board.  Mrs.  Ross has been  a patient at [facility] for a long while and has been seeing Dr.  Abigail Miyamoto for 25+ years.  She has been devastated by loss of her physician.   She has no current concerns, except she needs refills on most of her  medicines.   PAST MEDICAL HISTORY:  1. Hypertension.  2. Degenerative disk disease.  3. Osteoarthritis.  4. Iron deficiency anemia, followed by Dr. Myna Hidalgo.  5. Fibromyalgia.  6. Bowel obstruction secondary to adhesions.  7. Chronic pain.  8. Chronic fatigue syndrome.  9. Fibromyalgia.   SURGICAL HISTORY:  1. Intestinal resection x5.  2. Hysterectomy with oophorectomy.  3. Bladder lift.  4. Appendectomy.   MEDICATIONS:  1. Verelan PM 100 mg daily.  2. Gabapentin 600 mg q.i.d.  3. Clonazepam 0.5 mg two tablets nightly for leg twitching.  4. Fentanyl patch 500 mcg/hr transdermal.  5. Cyclobenzaprine 10 mg t.i.d.  6. Temazepam 15 mg one to two capsules nightly p.r.n.  7. Celebrex 200 mg daily.  8. Amitriptyline 25 mg two tablets nightly.  9. OxyContin/APAP 10/325 mg  q.i.d.  10.__________ 7.5 mg daily.   ALLERGIES:  SULFA.   FAMILY HISTORY:  Father alive with a history of coronary artery disease and  a hip replacement.  Mother deceased with a history of a stroke.  Brother  live with a history of hypertension.   SOCIAL HISTORY:  She is on disability.  She is married with 4 adopted  children.  She denies any alcohol or tobacco use.   REVIEW OF SYSTEMS:  Unremarkable.   OBJECTIVE:  VITAL SIGNS:  Weight 100.4.  Pulse 72, blood pressure 142/90.  GENERAL:  We have a pleasant female in no acute distress, answers questions  appropriately.  NECK:  Supple with no lymphadenopathy, carotid bruits or JVD.  LUNGS:  Clear.  HEART:  Regular rate and rhythm, normal S1 and S2.  No murmurs, gallops or  rubs.  EXTREMITIES:  No cyanosis, clubbing or edema.   IMPRESSION:  1. Hypertension.  2. Degenerative disk disease.  3. Osteoarthritis.  4. Fibromyalgia.  5. Recurrent bowel obstruction secondary to adhesions.  6. Iron deficiency anemia.  7. Chronic fatigue.  8. Chronic pain.   PLAN:  1. Given their recent issues with the pharmacy, the patient is aware that      I will need to verify all the medications she is currently taking as      well as review the medical records from her previous physician.  2. I will also get a list from the pharmacy regarding her current      medications.  3. I will have the patient follow up with me in 4 weeks, given the slight      elevation of her blood pressure today.                                   Leanne Chang, M.D.   LA/MedQ  DD:  11/21/2005  DT:  11/21/2005  Job #:  [id number]

## 2010-08-04 NOTE — Op Note (Signed)
[name], [name]               ACCOUNT NO.:  [id number]   MEDICAL RECORD NO.:  [id number]          PATIENT TYPE:  HAMB   LOCATION:                               FACILITY:  [facility]   PHYSICIAN:  Martina Sinner, MD DATE OF BIRTH:  [birth year]   DATE OF PROCEDURE:  04/30/2006  DATE OF DISCHARGE:                               OPERATIVE REPORT   PREOP DIAGNOSIS:  1. Vaginal stenosis.  2. Stress urinary incontinence.   POSTOPERATIVE DIAGNOSES:  1. Vaginal stenosis.  2. Stress urinary incontinence.   SURGERY:  1. Incision vaginal stenosis  2. Transurethral collagen injection treatment plus cystoscopy.   [name] has significant narrowing of the vaginal introitus and  based upon pelvic examination under anesthesia.  She likely has an  element of mild lichen sclerosis.  She does have mixed stressors  incontinence with a lot of bladder overactivity and we elected to try to  treat her outlet before treating her overactive bladder.   The patient was prepped and draped in the usual fashion.  Extra care was  taken to minimize any trauma to the introitus with prepping.  She was  given preoperative antibiotics.  Her preoperative blood tests were  normal.  Her cardiac workup was normal.   There was a palpable ring.  At the level of the introitus which I  incised, moderately deeply, 6 o'clock.  After the incision, the opening  of the vagina was very good.  She has a little bit of a ring effect in  the posterior mid vaginal vault, as though she has had a previous  rectocele repair, but I really felt that she did not need a lateral  incision of this which would also require postoperative dilators.  Her  vaginal length is approximately 7 cm.  She is quite short; and has a  small cystocele.   Hemostasis was excellent.  I transversely closed the introitus mucosa-to-  mucosa with interrupted 3-0 chromic.  This did not cause any narrowing  of the introitus.  I was very happy with the  procedure.   The patient then underwent transurethral collagen injection treatment.  The collagen scope was used for the examination and treatment.  Bladder  mucosa and trigone were normal.  There was no stitch foreign body or  carcinoma.  I injected three syringes of collagen at 5 and 7 o'clock  with excellent coaptation.  I emptied the bladder with a small red  rubber catheter.   The patient was sent to recovery room.  She will be given Vicodin,  ciprofloxacin, and Premarin cream postoperatively.           ______________________________  Martina Sinner, MD  Electronically Signed     SAM/MEDQ  D:  04/30/2006  T:  04/30/2006  Job:  [id number]

## 2010-08-04 NOTE — Discharge Summary (Signed)
[facility]  Patient:    [name], [name]               MRN: [id number] Adm. Date:  [id number] Disc. Date: [id number] Attending:  Henrene Dodge CC:         Hedwig Morton. Juanda Chance, M.D. [facility]   Discharge Summary  DISCHARGE DIAGNOSES: 1. Chronic, severe constipation. 2. Multiple intestinal adhesions with previous problems with colonic and small    bowel obstruction.  OPERATION:  Near total intra-abdominal colectomy with ileodistal sigmoid anastomosis.  HISTORY OF PRESENT ILLNESS:  The patient is a 65 year old Caucasian female who has had multiple previous admissions both here and at [facility] for abdominal pain.  She usually presents with severe bloating and severe constipation in spite of a chronic laxative regimen that consists of Dulcolax tablets about every three days, enemas.  She has had previous colonoscopies by Dr. [name] and has had three previous surgeries performed by Dr. Woodfin Ganja with a lot of intra-abdominal adhesions but, at no time, has she ever had any documented diverticulitis or actually a lesion within her colon.  I assisted on two of the previous surgeries and had always noted that she always had a very severe, kind of chronically bloated colon and, since Dr. Woodfin Ganja is no longer practicing, I have been managing her the last three years.  During this time, she has had three admissions in which she will present with severe bloating, cramping.  Sometimes, she has had a markedly elevated white count and significant abdominal tenderness.  We have tried all efforts as far as controlling this constipation with laxatives and I have recommended that we basically do a near-total intra-abdominal colectomy to hopefully manage the constipation on that method.  She is aware and realizes that she may have loose bowel movements and she still may have similar problems with this abdominal pain and constipation but is in  agreement and eager to try something since things have just deteriorated otherwise.  HOSPITAL COURSE:  She was admitted after having a three-day prep of her colon at home on the morning of surgery and was taken to surgery where Dr. Derrell Lolling assisted and we basically did a total intra-abdominal colectomy with anastomosis of the terminal ileum to the distal sigmoid colon.  She had most of the intra-abdominal adhesions of the small bowel also relysed.  We did leave some sort of short area in the more proximal small bowel that appeared to be kind of chronically dilated but not obstructed.  Postoperatively, she basically did fairly well.  Her postoperative white count was only 9200 and her hemoglobin was 26, and she had had transfusions following one of Dr. Retta Mac surgeries, and I gave her two units of blood the day after surgery which raised her hematocrit to 34.  Patient was about five days before having any type of bowel function.  We placed her on hyperalimentation at the time of surgery and started her on TPN immediately after surgery.  On the sixth postoperative day, she had had a bowel movement and when I removed the NG tube.  I then started her on a liquid diet which was advanced to kind of a full liquid diet, and she was ready for discharge in improved condition on the eighth postoperative day.  Her abdomen was reasonably, was certainly not distended, and she can gradually increase this to a regular diet over the next several days.  The staples will be removed in the office and the pathology  report showed no evidence of any mechanical lesions, but we had not expected to find any actual obstructive points since she had had a colonoscopy within the past six months.  She is discharged with instructions to see me in the office and not to drive if she has taken the Tylox and, hopefully, this problem with the chronic constipation will no longer be a problem. DD:  02/28/00 TD:   02/29/00 Job: [id number]

## 2010-08-04 NOTE — Discharge Summary (Signed)
[facility]  Patient:    Becky, Ross Visit Number: [id number] MRN: [id number]          Service Type: MED Location: [phone] Attending Physician:  Gracelyn Nurse Dictated by:   Anselm Pancoast. Zachery Dakins, M.D. Admit Date:  03/30/2001 Disc. Date: 03/03/01   CC:         Dr. Janey Greaser   Discharge Summary  CONTINUATION  DISCHARGE DIAGNOSES: 1. Chronic abdominal pain. 2. History of excessive narcotic use. 3. Previous intestinal obstruction secondary to intestinal adhesions. 4. History of fibromyalgia.  HISTORY OF PRESENT ILLNESS:  [name] is a 65 year old female who was referred to the emergency room from the [facility] office with a diagnosis of probable small bowel obstruction.  The patient was previously managed by Levan Hurst, M.D., and has had three previous operations for intestinal obstructions with extensive intra-abdominal adhesions identified each time.  Approximately a year ago after she had had repeated episodes of bloating and each time she had a massively distended colon secondary to fecal impaction, I recommended that we proceed with a total intra-abdominal colectomy and this was performed.  At that time, she also had extensive intra-abdominal adhesions.  Most were taken down.  Postoperatively, she did satisfactorily as far as return of bowel function, but still complaining of abdominal pain and has been managed by Chales Salmon. Abigail Miyamoto, M.D., her primary physician.  Since then they have come up with a diagnosis of fibromyalgia.  Also, she says that she has back problems, but is on large chronic pain medications, OxyContin approximately 60 mg a day plus Tylox which she uses for "breakthrough pain."  The patient has also had extensive x-rays and upper endoscopies managed by Fayrene Fearing L. Edwards, M.D., with the findings of kind of a bloated proximal small bowel, but never definite small bowel obstruction being documented  in the past year.  She was seen on March 29, 2001, and referred over with the diagnosis of intestinal obstruction.  HOSPITAL COURSE:  Zigmund Daniel, M.D., was on call and he actually admitted her on March 29, 2001. She has very poor IV access.  After the IV team was not able to get an IV in, Zigmund Daniel, M.D., inserted a triple-lumen subclavian catheter.  A chest x-ray afterward showed it to be in good position with no pneumothorax.  He saw her the following morning and on third hospital day, I was back and he asked me to manage her.  In the emergency room, she was not able to be moved to the floor, so she was there for about 24 hours and she was given IV morphine for pain.  During this time, she started having bowel function.  When she was moved to the floor, I saw her on March 31, 2001, and she did have a little bit of bile in the NG tube. Her abdomen was flat.  Each time when she has actually been obstructed before, she had been very distended.  I have recommended that we get a CT with p.o. contrast.  I also asked Gracelyn Nurse, M.D., who was on call for the hospitalists, to see her.  Zigmund Daniel, M.D., had not noted that she was on this OxyContin chronically when he actually put her in the hospital and of course the Demerol that she was getting at 2-3 mg IV q.1-2h. was a lot of less narcotic and of course she was getting the OxyContin.  The CT was performed on  March 31, 2001, and it showed a moderate right hydronephrosis, but there was no evidence of a small bowel obstruction with the small bowel loops showing contrast and the contrast going into the short colon at an appropriate time. The patient did have a large bladder while she was receiving the pain medication.  I discussed with her that since she had a subclavian and she is down to about 85 pounds that we would start her on TPN.  This was started. The hospitalist did see her and discussed with her about  the possibility of trying to taper down her pain medications since I am sure that this is definitely effecting her intestinal bowel function.  He was of the opinion that she has a chronic pain syndrome/fibromyalgia.  We tapered the morphine. I called and talked with Dr. Chales Salmon. [employer] about this and they stated that yes she had been kind of referred for pain management, but this had not achieved very much.  Dr. Abigail Miyamoto was actually out of the hospital and Dr. Janey Greaser, who I talked to, wanted to wait and let her discuss this with him the following week.  We removed the NG tube.  She was started on liquids, which she tolerated.  She never was bloated.  She eats slowly.  The nurses reported on April 02, 2001, that she was finishing all of her tray contents.  She was advanced to a select diet on April 02, 2001.  We tapered TPN and at that time I transferred her to Dr. Nevin Bloodgood service since I was trying to get someone to actually discuss with her about the pain management.  He was actually the attending physician at the time of her discharge on April 03, 2001, but according to the information sheet that he filled out, he released her on the 20 mg of OxyContin q.8h. plus the other pain medication as needed, which is what she was taking previously.  The subclavian catheter was removed. In spite of her weight being down to 85 pounds, her albumin was 3.1 and her liver function studies and all were normal.  Her white count this admission was never elevated.  Her hemoglobin was 10.4 on admission with a white count of 7.4.  When she has had previous intestinal obstructions, her white count has gone significantly elevated on the previous times that I have seen her when she has had an intestinal obstruction.  Hopefully the patient will be in agreement with pain management concept, but I feel that unless a definite intestinal obstruction can be documented that her fifth now  operation for lysis of adhesions would not be beneficial as the  previously three performed by Levan Hurst, M.D., two of which I was present, all adhesions had been taken down, but she only becomes more densely scarred down after each operation. Dictated by:   Anselm Pancoast. Zachery Dakins, M.D. Attending Physician:  Marcelino Duster D DD:  04/07/01 TD:  04/08/01 Job: [id number]

## 2010-08-04 NOTE — Op Note (Signed)
[facility]  Patient:    [name], [name]               MRN: [id number] Proc. Date: 01/25/00 Adm. Date:  [id number] Disc. Date: [id number] Attending:  Henrene Dodge CC:         Hedwig Morton. Juanda Chance, M.D. [facility]   Operative Report  PREOPERATIVE DIAGNOSES: 1. Chronic severe constipation. 2. Multiple intra-abdominal adhesions.  POSTOPERATIVE DIAGNOSES: 1. Chronic severe constipation. 2. Multiple intra-abdominal adhesions.  OPERATION:  Exploratory laparotomy and subtotal intra-abdominal colectomy with ileosigmoid colon anastomosis and lysis of adhesions.  SURGEON:  Anselm Pancoast. Zachery Dakins, M.D.  ASSISTANT:  Lucille Passy, M.D.  ANESTHESIA:  General.  HISTORY:  Ms. Wahlberg is a 65 year old Caucasian female who previously was a patient of Dr. [name] for assessment and two _________  operations and over the last probably 15 years, she has had multiple chronic problems with pain with very poor bowel functions.  Her last surgery for lysis of adhesions I think, was approximately four to five years ago.  Over the last three years, she has had three admissions, presenting extremely constipated with nausea, vomiting, and abdominally distented.  On some occasions, she has been mildly febrile with elevated white count and extremely constipated. On the last admission was at [facility] with these symptoms back in September of this year.  Her most severe problem was practically nine months earlier when she was admitted at [facility], and she has had colonoscopies and has a lot of adhesions, and it was difficult to completely evaluate the colon.  She is followed by Dr. [name].  On the x-rays each of the times, it appears more of a problem with constipation as the etiology of obstruction than that of an obvious small-bowel obstruction.  She has been on a laxative regimen consisting of milk of magnesia and ______ enemas on a daily basis in large doses  of about 8 Dulcolax tablets after the third day trying to keep the colon unobstructed.  She has cramps and bloats and says "something is just got to be done."  I have recommended that, if surgery is definitely needed, that not only lysis of adhesions but basically an intra-abdominal colectomy be performed since it appears she has had a lifelong problem with chronic constipation.  The patient is in agreement with this and understands the risk of possibly having chronic diarrhea and also understands the risk of infection and bleeding.  She did have a GoLYTELY bowel prep and she prepped herself over a period of about three days and had Neomycin and erythromycin orally yesterday evening.  Preoperatively, her lab studies are essentially unremarkable at this time.  The patient was given 3 g of Unasyn and PAS stockings and was taken to the operative suite.  DESCRIPTION OF PROCEDURE:  The induction of general anesthesia was via orotracheal tube and a nasogastric tube was placed.  A Foley catheter was inserted sterilely, and Dr. Rica Mast placed a subclavian in the left subclavian for postoperative IVs and TPN.  The patient was then draped after being prepped with Betadine surgical scrub solution and draped in sterile manner.  The midline incision, which was a generous midline incision, was opened with sharp dissection down through the skin and subcutaneous and Scarpas fascia. The fascia was picked up between hemostats and on opening through the peritoneal cavity, fortunately, the adhesions which were very extensive were reasonably soft to the anterior abdominal wall.  These were all carefully taken down.  First I  was working on the right and the switched to the left side.  The colon itself is not real dilated now.  The cecum is all bloated, but fortunately there was stone within it and we first after the sum of adhesions to the small bowel had been taken down, we started first mobilizing the cecum  and the hepatic flexure; there was no evidence of any stones in the gallbladder.  We mobilized the transverse colon and then the left colon where shes got really the most extensive adhesions.  It was difficult to separate the small bowel that was kind of up in the splenic flexure area from the colon, and basically, I elected to basically, do a splenic flexure mobilization and dissection of the descending colon to provide relief, in taking down the small bowel loops that were very densely adherent to the retroperitoneal area.   There was one little area that we had pull in and obviously we tore the small bowel and this was recognized and promptly closed with interrupted sutures of 3-0 silk sutures.  Next, the descending colon and proximal sigmoid colon were mobilized so that the small bowel and terminal ileum could be brought down to the mid-sigmoid colon, and at that point, an end-to-end anastomosis with 3-0 silk knots inverted was performed.  The mesenteric defect was closed with 3-0 silk.  The bowel lies without any tension.  We then ran the small bowel in both the antegrade and retrograde manner, making sure that were no other injuries or evidence of bleeding, and then reinspected the pelvis, the left splenic fossa, the hepatic flexure area, and etc. and a few little bleeders were electrocoagulated or sutured but there was no extensive bleeding.  We then replaced small bowel in relative anatomic position and the remaining omentum that was present was brought down over this and then the fascia was closed with running #1 PDS and interrupted #1 Prolene for about every three loops of sutures.  The NG tube was in good position, and the skin was closed with staples.  The patient tolerated the procedure satisfactorily.  We will get a chest x-ray in the recovery room.  Sponge and needle counts were correct. She had very good urine output.  She probable had about 250 cc of blood loss or exudate  just because of the duration of the surgery - it was about nearly a 5-hour operation. DD:  01/25/00 TD:  01/26/00  Job: [id number]

## 2010-08-04 NOTE — Assessment & Plan Note (Signed)
[name]                           GASTROENTEROLOGY OFFICE NOTE   NAME:[name]                      MRN:          [id number]  DATE:12/28/2005                            DOB:          07-Jan-1946    [name] is a 65 year old black female, transferring her care from [facility]  GI where she was dismissed, to our practice.  Her records are in process of  being transferred to [place].  We actually did see her as a patient in 1980s up  until 2001 when she transferred her care to Dr. Randa Evens.  She has a history  of colonic dysmotility, dilated colon, status post septal colectomy by Dr.  Zachery Dakins in 2001 and subsequent exploratory laparotomy by Dr. Lurene Shadow in  2003.  She is having intermittent abdominal pain, nausea and vomiting  which  occurs about on a monthly basis.  Since last October, she has had about 8 or  9 attacks which start with vomiting and last about 24-48 hours.  She has a  PICC line through which she administers normal saline with help of advanced  home care. This occurs about every 3-4 weeks. She has been functioning quite  well with it. She adopted 4 children with special needs, and she takes care  of them.  She has been disabled because of fibromyalgia since 2001.  She  also has a history of iron-deficiency anemia which has been evaluated by Dr.  Myna Hidalgo and bladder spasm followed by Dr. Sherron Monday in urology.   MEDICATIONS:  1. Celebrex 200 mg p.o. b.i.d. for back and hand problems.  2. Cardura 4 mg p.o. daily.  3. Temazepam p.r.n.  4. Cyclobenzaprine 3 times a day.  5. Neurontin 600 mg 4 times a day.  6. Endocet 10/325.  7. Oxycodone.  8. Acetaminophen twice a day.  9. Clonazepam 0.5 mg nightly on p.r.n. basis only.  10.Miralene 100 mg p.o. q.a.m. and 20 mg b.i.d. for bladder spasm.  11.She also on p.r.n. basis takes Phenergan and amitriptyline.   Last upper endoscopy was done several years ago by Dr.  Randa Evens.  Those  records  are pending.  She also had a small-bowel follow-through many years  ago, not recently.  I did the last colonoscopy in 2000 which showed  tortuous, redundant colon, was difficult to traverse.   PHYSICAL EXAMINATION:  VITAL SIGNS: Blood pressure 108/76, pulse 64, weight  101 pounds.  GENERAL: She was thin.  This is her usual weight.  She was alert and  oriented, in no distress.  HEENT: Oral cavity showed no calluses.  Tongue was peculated .  LUNGS: Clear to auscultation.  COR: Normal S1 and S2.  ABDOMEN: Soft but moderately distended with several surgical scars.  Increased tympany across the mid abdomen and umbilicus with hyperactive  bowel sounds.  Tenderness in the left middle quadrant.  No rebound. Liver  edge at costal margin.  No fluid wave.  RECTAL:  Hemoccult-negative stool.  EXTREMITIES: Trace edema.   IMPRESSION:  38. A 65 year old black female with intermittent small-bowel obstruction      due to benign disease  status post multiple resections of the small      bowel and adhesions status post subtotal colectomy for colonic      hypomotility in 2001 with recurrence of small-bowel obstructions over      the past year.  2. Gastroesophageal reflux disease.  3. Dependence on narcotics.  4. Fibromyalgia.  5. PICC line for fluid administration for hydration.   PLAN:  1. Small-bowel follow-through and upper GI to access the bowel motility      transit time and dilatation.  2. Lower diet with small frequent feedings.  3. Prevacid solutab the patient has at home to take on p.r.n. basis for      acid reflux.  4. I would be reluctant to refer her for another surgery, but the small-      bowel follow-through may be informative in assessing her bowel motility      and any need for future resections.       Hedwig Morton. Juanda Chance, MD      DMB/MedQ  DD:  12/28/2005  DT:  12/29/2005  Job #:  [id number]   cc:   [name], M.D.

## 2010-08-04 NOTE — H&P (Signed)
[facility]  Patient:    [name], [name] Visit Number: [id number] MRN: [id number]          Service Type: SUR Location: 1E 0106 01 Attending Physician:  Sandi Raveling Dictated by:   Zigmund Daniel, M.D. Admit Date:  02/26/2001                           History and Physical  CHIEF COMPLAINT:  Abdominal pain and vomiting.  HISTORY OF PRESENT ILLNESS:  The patient is a 65 year old white female who has had a very long history of chronic constipation and recurring small bowel obstruction.  She states that she has been operated on three times for small bowel obstruction.  About a year ago, Anselm Pancoast. Zachery Dakins, M.D., did a subtotal colectomy with ileoproctostomy for chronic constipation and recurring problems moving her bowels.  That seemed to be initially helpful, but she again began having trouble with bloating.  This was increased and over the past couple of days she has not been able to keep down food or fluids and has become obstipated.  X-rays done at her doctors office showed some dilatation of the small bowel loops and some air/fluid levels.  She is admitted for treatment of small bowel obstruction.  Labs are pending.  PAST MEDICAL HISTORY:  Prior to the problems with her small bowel obstruction, the patient has had three pelvic operations.  She has never had a diagnosis of cancer.  She has hypertension and is on hydrochlorothiazide and another medication for that.  She does not smoke or drink.  She denied heart trouble, lung trouble, kidney trouble, and other serious chronic problems.  REVIEW OF SYSTEMS:  Unremarkable beyond the above.  FAMILY HISTORY:  Unremarkable beyond the above.  ALLERGIES:  She denied medication allergies.  PHYSICAL EXAMINATION:  The patient is in no acute distress.  She is thin, almost frail in appearance.  VITAL SIGNS:  As recorded by nursing staff.  HEENT:  The head, eyes, ears, nose, mouth, and throat are  unremarkable.  NECK:  Unremarkable.  CHEST:  Clear to auscultation.  BREASTS:  Normal.  HEART:  Rate and rhythm normal.  No murmur or gallop.  The heart rate was a little rapid.  ABDOMEN:  Slight distention.  Slight diffuse tenderness.  Very active bowel sounds.  No rebound tenderness or mass.  No hernia.  A well-healed midline incision.  RECTAL AND PELVIC:  Not repeated having recently been reported normal.  EXTREMITIES:  Normal.  No edema.  Good pulses.  SKIN:  No lesions noted.  LYMPH NODES:  Not enlarged.  IMPRESSION: 1. Small bowel obstruction. 2. Hypertension.  PLAN:  IV fluids, nasogastric suction, and possible eventual laparotomy. Dictated by:   Zigmund Daniel, M.D. Attending Physician:  Sandi Raveling DD:  02/26/01 TD:  02/26/01 Job: [id number]

## 2010-08-04 NOTE — Procedures (Signed)
[facility]  Patient:    [name], [name] Visit Number: [id number] MRN: [id number]          Service Type: OUT Location: OMED Attending Physician:  Janan Halter Dictated by:   Lowell C. Catha Gosselin, M.D. Proc. Date: 03/28/01 Admit Date:  03/28/2001   CC:         Chales Salmon. Abigail Miyamoto, M.D.   Procedure Report  DATE OF BIRTH:  [birth year]  PROCEDURE:  Bone marrow biopsy and aspiration.  INDICATION:  Microcytic anemia.  SUMMARY:  From left posterior iliac crest, bone marrow biopsy and aspiration were obtained under 2% lidocaine local.  There is no obvious complications and minimal bleeding.  Bone marrow biopsy and aspirate were obtained for routine processing.  The patient tolerated it well and tomorrow will remove her bandage and call [phone] for any problems or questions.  Will notify her next week of any results that return so we can sit down and discuss them. Dictated by:   Lowell C. Catha Gosselin, M.D. Attending Physician:  Janan Halter DD:  03/28/01 TD:  03/29/01 Job: [id number]

## 2010-08-04 NOTE — Op Note (Signed)
[facility]  Patient:    [name], [name]               MRN: [id number] Proc. Date: 01/25/00 Adm. Date:  [id number] Attending:  Henrene Dodge CC:         Hedwig Morton. Juanda Chance, M.D. [facility]   Operative Report  PREOPERATIVE DIAGNOSIS: 1. Chronic severe constipation. 2. Multiple intra-abdominal adhesions.  POSTOPERATIVE DIAGNOSIS: 1. Chronic severe constipation. 2. Multiple intra-abdominal adhesions.  OPERATION:  Exploratory laparotomy and subtotal intra-abdominal colectomy with ileosigmoid colon anastomosis and lysis of adhesions.  SURGEON:  Anselm Pancoast. Zachery Dakins, M.D.  ASSISTANT:  Lucille Passy, M.D.  ANESTHESIA:  General.  HISTORY:  [name] is a 65 year old Caucasian female who previously was a patient of Dr. [name] for assessment and two _________  operations and over the last probably 15 years, she has had multiple chronic problems with pain with very poor bowel functions.  Her last surgery for lysis of adhesions I think, was approximately four to five years ago.  Over the last three years, she has had three admissions, presenting extremely constipated with nausea, vomiting, and abdominally distented.  On some occasions, she has been mildly febrile with elevated white count and extremely constipated. On the last admission was at [facility] with these symptoms back in September of this year.  Her most severe problem was practically nine months earlier when she was admitted at [facility], and she has had colonoscopies and has a lot of adhesions, and it was difficult to completely evaluate the colon.  She is followed by Dr. [name].  On the x-rays each of the times, it appears more of a problem with constipation as the etiology of obstruction than that of an obvious small-bowel obstruction.  She has been on a laxative regimen consisting of Milk of Magnesia and ______ enemas on a daily basis in large doses of about 8 Dulcolax  tablets after the third day trying to keep the colon unobstructed.  She has cramps and bloats and says "something is just got to be done."  I have recommended that, if surgery is definitely needed, that not only lysis of adhesions but basically an intra-abdominal colectomy be performed since it appears she has had a lifelong problem with chronic constipation.  The patient is in agreement with this and understands the risk of possibly having chronic diarrhea and also understands the risk of infection and bleeding.  She did have a GoLYTELY bowel prep and she prepped herself over a period of about three days and had Neomycin and erythromycin orally yesterday evening.  Preoperatively, her lab studies are essentially unremarkable at this time.  The patient was given 3 g of Unasyn and PAS stockings and was taken to the operative suite.  DESCRIPTION OF PROCEDURE:  The induction of general anesthesia was via orotracheal tube and a nasogastric tube was placed.  A Foley catheter was inserted sterilely, and Dr. Rica Mast placed a subclavian in the left subclavian for postoperative IVs and TPN.  The patient was then draped after being prepped with Betadine surgical scrub solution and draped in sterile manner.  The midline incision, which was a generous midline incision, was opened with sharp dissection down through the skin and subcutaneous and Scarpas fascia. The fascia was picked up between hemostats and on opening through the peritoneal cavity, fortunately, the adhesions which were very extensive were reasonably soft to the anterior abdominal wall.  These were all carefully taken down.  First I was working on  the right and the switched to the left side.  The colon itself is not real dilated now.  The cecum is all bloated, but fortunately there was stone within it and we first after the sum of adhesions to the small bowel had been taken down, we started first mobilizing the cecum and the hepatic  flexure; there was no evidence of any stones in the gallbladder.  We mobilized the transverse colon and then the left colon where shes got really the most extensive adhesions.  It was difficult to separate the small bowel that was kind of up in the splenic flexure area from the colon, and basically, I elected to basically, do a splenic flexure mobilization and dissection of the descending colon to provide relief, in taking down the small bowel loops that were very densely adherent to the retroperitoneal area.   There was one little area that we had pull in and obviously we tore the small bowel and this was recognized and promptly closed with interrupted sutures of 3-0 silk sutures.  Next, the descending colon and proximal sigmoid colon were mobilized so that the small bowel and terminal ileum could be brought down to the mid-sigmoid colon, and at that point, an end-to-end anastomosis with 3-0 silk knots inverted was performed.  The mesenteric defect was closed with 3-0 silk.  The bowel lies without any tension.  We then ran the small bowel in both the antegrade and retrograde manner, making sure that were no other injuries or evidence of bleeding, and then reinspected the pelvis, the left splenic fossa, the hepatic flexure area, and etc. and a few little bleeders were electrocoagulated or sutured but there was no extensive bleeding.  We then replaced small bowel in relative anatomic position and the remaining omentum that was present was brought down over this and then the fascia was closed with running #1 PDS and interrupted #1 Prolene for about every three loops of sutures.  The NG tube was in good position, and the skin was closed with staples.  The patient tolerated the procedure satisfactorily.  We will get a chest x-ray in the recovery room.  Sponge and needle counts were correct. She had very good urine output.  She probable had about 250 cc of blood loss or exudate just because of  the duration of the surgery - it was about nearly a 5-hour operation. DD:  01/25/00 TD:  01/26/00 Job: [id number]

## 2010-08-04 NOTE — H&P (Signed)
NAMECRYSTALYNN, [name]               ACCOUNT NO.:  [id number]   MEDICAL RECORD NO.:  [id number]          PATIENT TYPE:  EMS   LOCATION:  ED                           FACILITY:  [facility]   PHYSICIAN:  Hollice Espy, M.D.DATE OF BIRTH:  [birth year]   DATE OF ADMISSION:  04/20/2004  DATE OF DISCHARGE:                                HISTORY & PHYSICAL   PRIMARY CARE PHYSICIAN:  Chales Salmon. Abigail Miyamoto, M.D.   CHIEF COMPLAINT:  Cellulitis of the nose.   HISTORY OF PRESENT ILLNESS:  The patient is a 65 year old white female with  chronic medical issues including fibromyalgia, osteoporosis, chronic  fatigue, multiple abdominal surgeries leading to a number of multiple  abdominal obstructions and she has also had a chronic PICC line.  She was  previously in the hospital back approximately two years ago and at that time  had been having multiple hospital admissions for obstruction.  She was  discharged on a PICC line which she goes home with and apparently when she  starts having problems with abdominal pain, received IV narcotic medicine  through this PICC line to prevent any severe bouts of pain and preventing a  hospitalization.  She has not ever had the PICC line changed and it  continues to remain patent.  The patient recently, approximately four days  ago, started noticing that she was having a fever while at home.  She was  complaining of a worsening headache and started developing a cough as well  as redness on her nose.  She became concerned as she had had a previous  history of cellulitis and Staph infection, then came in to be evaluated in  the office.  Later in the office her temperature was noted to be slightly  elevated at 99.4 and she was tachycardic at 108.  Her blood pressure was  checked and it was 160/96.  Dr. Abigail Miyamoto was concerned that she may be having  a recurrence of her cellulitis and contacted [facility] to make the  patient a direct admission.  The patient was sent  over to [facility].  There were no beds available and the patient was sent to the emergency room.  Emergency room labs were drawn and the patient was noted to have a white  count of 5.7, however, with an 80% shift.  The rest of her lab work was  otherwise unremarkable.  The patient herself feels very weak and very tired.  She complains of hurting all over.  She denies any visual changes.  She does  complain of a headache, bitemporal which has been getting worse over the  last couple days.  She complains of a cough, nonproductive, as well as some  sore throat.  She denies any shortness of breath, no chest pain, or  palpitations.  She denies any current abdominal pain.  She has no problem  with hematuria, dysuria, constipation, or diarrhea lately.  She denies any  focal extremity pain and complains again that she huts all over. Her review  of systems is otherwise negative.   PAST MEDICAL HISTORY:  1.  Osteoporosis.  2.  Chronic fatigue.  3.  Fibromyalgia.  4.  Multiple abdominal surgeries.  5.  Multiple small bowel obstructions.  6.  Chronic PICC line for the past two years.  7.  Hypertension.   MEDICATIONS:  1.  The patient is on Actonel 35 p.o. each week.  2.  Cardura 2 p.o. daily.  3.  Neurontin 300 p.o. four times a day.  4.  Norvasc 5 daily.  5.  Duragesic 50 mcg patch topically, q.72 h.  6.  Lexapro 10 mg p.o. t.i.d.  7.  Celebrex 200 mg p.o. t.i.d.  8.  Ambien 5 mg p.o. q.h.s.  9.  Vicodin 5/500 q.4-6h. p.r.n.   ALLERGIES:  The patient is allergic to a medicine called TALWIN.   SOCIAL HISTORY:  She denies any tobacco, alcohol, or drug use.   FAMILY HISTORY:  Noncontributory.   PHYSICAL EXAMINATION:  VITAL SIGNS:  On admission temperature was 99.1,  blood pressure 184/106, respirations 18, heart rate 125, O2 saturation 94%  on room  air.  GENERAL:  In general the patient appears to be alert and oriented, in mild  distress secondary to pain and worry.  HEENT:   Normocephalic, atraumatic.  Mucous membranes are slightly dry.  She  admitted to having some mild erythema over the nose, but otherwise she has  no other involvement of the rest of her face.  There is no extension to the  cheek, periorbital, or orbital area.  HEART:  Regular rate and rhythm, S1/S2.  LUNGS: Clear o auscultation bilaterally.  ABDOMEN:  Soft, nontender, nondistended.  Positive bowel sounds.  EXTREMITIES:  No clubbing, cyanosis, or trace pitting edema.   LABORATORY DATA:  White count 5.7, H&H 13.8, 42.6, MCV of 84, platelet count  of 248.  Neutrophils are 80.  Sodium 134, potassium 4.1, chloride 100,  bicarb 30, BUN 4, creatinine 0.7, glucose 87, calcium 8.1.   ASSESSMENT/PLAN:  1.  Questionable facial cellulitis of the nose.  She has a normal white      count and a mild shift.  She has no history of diabetes mellitus and I      am not concerned about a severe infection such as Rhizopus.  Will go      ahead and cover with IV Zosyn and vancomycin.  The reason for such      severe broad spectrum antibiotics is I am concerned about her chronic      PICC line which is the second problem.  11.  Two-year-old PICC line, very concerning.  Will go ahead and check blood      cultures.  I worry that this is the cause of her history of recurrent      infection or contributes to some component of her illness.  Will go      ahead and      change this.  3.  Cough.  Will go ahead and evaluate to make sure she has no evidence of      pneumonia.  4.  Fibromyalgia and osteoporosis.  Will continue her medications.      SKK/MEDQ  D:  04/20/2004  T:  04/20/2004  Job:  [id number]   cc:   Chales Salmon. Abigail Miyamoto, M.D.  [address]  [place]  [place] [address]  Fax: [phone]

## 2010-08-04 NOTE — H&P (Signed)
NAMESHELBEE, APGAR               ACCOUNT NO.:  [id number]   MEDICAL RECORD NO.:  [id number]          PATIENT TYPE:  AMB   LOCATION:  [facility]                         FACILITY:  [name]   PHYSICIAN:  Colleen Can. Deborah Chalk, M.D.DATE OF BIRTH:  25-Feb-1946   DATE OF ADMISSION:  04/06/2006  DATE OF DISCHARGE:                              HISTORY & PHYSICAL   CHIEF COMPLAINT:  None.   HISTORY OF PRESENT ILLNESS:  [name] is a 65 year old white female  who was previously scheduled for bladder surgery.  She had an abnormal  preoperative EKG with left bundle branch block.  She has had a remote  history of chest pain approximately a month ago that was associated with  mild diaphoresis and radiation into the neck.  She described it as a  vague uncomfortable sensation.  She was referred for 2D echocardiogram  as well as a Cardiolite study.  Her echocardiogram showed an ejection  fraction of 50 to 55% with septal wall motion abnormality secondary to  the left bundle branch block.  She had mild LV systolic dysfunction and  mild MR and TR with a small pericardial effusion.  Her Cardiolite study  was performed and showed  reduction of ejection fraction at 36%.  There  was also septal ischemia.  She is now referred for cardiac  catheterization.   PAST MEDICAL HISTORY:  1. Left bundle branch block.  2. Periodic GI symptoms associated with nausea and actually has a PICC      line in place for IV fluids.  3. History of incontinence with plans for upcoming collagen injection      and excision of a vaginal scar.  4. Left and right ovary removal as well as uterus removal in 1987.  5. Fibromyalgia.  6. Intestinal obstruction.  7. Remote tonsillectomy.  8. History of anemia with periodic iron transfusions.   ALLERGIES:  None.   CURRENT MEDICATIONS:  1. Doxazosin 4 mg a day.  2. Gabapentin 600 mg 4 times a day.  3. Verelan 100 mg a day.  4. Endocet 10/325 four per day.  5. Clonazepam 0.5 at  bedtime.  6. Sanctura 20 mg taking 2 per day.  7. Restoril 50 one to two at bedtime p.r.n.  8. Celebrex 200 b.i.d.  9. Amitriptyline 25 a day.  10.Cyclobenzaprine 10 mg t.i.d.  11.Phenergan 25 p.r.n.   FAMILY HISTORY:  Her father is a long-term patient of ours.  He has  hypertension and known heart disease and ICD in place.  He is 84 years  ago.  Mother died at 50 years old following acute stroke.   SOCIAL HISTORY:  She is disabled.  She is a nonsmoker for 20 years.  She  drinks two to three cups of coffee a day.  She lives at home with her  husband and adopted children.  She his one 19 year old daughter  biologically, but they have also adopted a 80-year-old girl, 66 year old  boy, and two 66 year old boys with disabilities.   REVIEW OF SYSTEMS:  As noted above and is otherwise unremarkable.   PHYSICAL EXAMINATION:  She is  a pleasant very thin white female in no  acute distress.  Her weight is 100 pounds, blood pressure is 138/80 sitting and 120/70  standing.  Heart rate is 78.  HEENT:  Unremarkable.  NECK:  Supple.  LUNGS:  Clear.  HEART:  A regular rhythm but with an S4 gallop.  ABDOMEN:  Soft, positive bowel sounds, nontender.  EXTREMITIES:  Without edema.  NEUROLOGIC:  No gross focal deficits.   Her EKG shows left bundle branch block with sinus tachycardia.   Other labs are pending.   OVERALL IMPRESSION:  1. Abnormal Cardiolite study.  2. Left bundle branch block.  3. Remote episode of chest pain.  4. Fibromyalgia.   PLAN:  Will proceed with diagnostic catheterization, and procedure,  risks, and benefits have all been explained, and she is willing to  proceed on Monday, April 08, 2006.      [name], N.P.      Colleen Can. Deborah Chalk, M.D.  Electronically Signed    LC/MEDQ  D:  04/05/2006  T:  04/06/2006  Job:  [id number]   cc:   Martina Sinner, MD

## 2010-08-04 NOTE — Discharge Summary (Signed)
[facility]  Patient:    [name], [name]                      MRN: [id number] Adm. Date:  [id number] Disc. Date: [id number] Attending:  Henrene Dodge CC:         Hedwig Morton. Juanda Chance, M.D. [facility]   Discharge Summary  DISCHARGE DIAGNOSES: 1. Chronic constipation with small-bowel obstruction. 2. Multiple adhesions from previous abdominal surgery. 3. Functional constipation.  OPERATIONS:  None.  HISTORY:  [name] is a 65 year old Caucasian female who was admitted this time on November 22, 1999, with cramping, bloating, severe abdominal pain. The patient has a long history of functional constipation.  She has had three previous surgeries, all by Dr. ______  for extensive problems with adhesions, and the last time that I had the patient in was about eight months ago with a similar episode.  Since then, she has had a colonoscopy by Dr. [name] that was difficult to perform but with no functional problems noted.  The present episode started approximately three or four days prior to her admission.  She takes Dulcolax tablets, 4 on a kind of a regular basis every severe days and this time did not get much result.  She then was more bloated with cramping.  She had Dulcolax suppositories with which she only got a small amount of stool, and presented to the emergency room on November 22, 1999, bloated, very distended, and a pulse of 120.  On physical exam, I have seen her more distended.  She is kind of full in all quadrant, not locally tender.  Her admission white count was not elevated.  At Christmas approximately a year ago, she had a very elevated white count with one of these episodes, but she was only 8800 at this time.  HOSPITAL COURSE:  I admitted her.  A nasogastric tube was placed with foul-smelling NG drainage for the first 8 or 10 hours.  Soap suds enemas to kind of soften things up, and then her bowel started functioning.  The nasogastric  tube was removed on about the third hospital day.  Her electrolytes have always remained normal.  Repeat white count on September 7 was 5.7.  We started her on a full liquid diet the day before yesterday which she has tolerated.  Her IV has been discontinued, and she is now having bowel movements and doing satisfactory.  It appears to me that the function of her colon, of course, she has had a chronic problem with constipation, and she is well aware of this and uses chronic laxatives.  But when these are not doing the job, I think a soap suds enema when the symptoms first start would probably correct the problem and prevent these repeated hospitalizations.  We will plan on discharging her on a full liquid diet for approximately a couple of days.  She will continue on her usual laxative routine, and then we will make sure that she has the enemas available at home if needed in the future.  I will plan on seeing her back in the office in approximately one week, and she is discharged with no medications for pain.  DISPOSITION:  She is discharged in markedly improved condition.  Her abdomen is soft, and she is tolerating a full liquid diet without problems at this time. DD:  11/27/99 TD:  11/27/99 Job: [id number]

## 2010-08-04 NOTE — H&P (Signed)
[facility]  Patient:    [name], [name]               MRN: [id number] Adm. Date:  [id number] Attending:  Henrene Dodge                         History and Physical  CHIEF COMPLAINT:  Chronic abdominal pain and constipation.  HISTORY:  The patient is a 65 year old Caucasian female who has had multiple previous admissions here and also at [facility] for abdominal pain and she usually presents with bloated distention and severe constipation, in spite of a chronic laxative regimen and Dulcolax tablets about every three days.  She has had colonoscopies by Dr. Hedwig Morton. Juanda Chance and has a lot of adhesions but no tumor or obvious diverticulitis or areas of mechanical blockage and she has had three previous laparotomies -- and I have assisted in two -- for intra-abdominal adhesions with complete freeing up of the small bowel from the ligament of Treitz to the terminal ileum.  Each time, she appears to have some improvement initially, but then back to the old regimen of laxatives and abdominal pain and states that something has definitely got to be done.  We do not feel there is any underlying Hirschsprungs or anything of this nature, since her colon, once you get it cleaned out, always functions satisfactorily, but she has had a lifelong history of this problem with constipation and I think it is more of a chronic problem with the laxative abuse than other physiologic problem.  I have recommended that we proceed basically with intra-abdominal colectomy and do an anastomosis from the terminal ileum to the sigmoid colon, which hopefully will not give her chronic diarrhea, but will also get rid of this problem of chronic constipation; the patient is in agreement and understands the possibilities of bleeding, infection, etc.  Her last colonoscopy was with Dr. Juanda Chance approximately a year ago.  MEDICATIONS:  Chronic medication, besides her laxatives, is  on estrogen replacement, Premarin 0.0625 mg pill; she is also on Vioxx two times a day.  REVIEW OF SYSTEMS:  EYES, EARS, NOSE AND THROAT:  Denies acute symptoms. CHEST:  She does not smoke cigarettes.  CARDIAC:  No history of angina or myocardial infarction.  GI:  See history of present illness.  MUSCULOSKELETAL: No real chronic problems.  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  Patient is married.  PHYSICAL EXAMINATION  GENERAL:  She is a female who appears younger than her stated age and in no acute distress.  VITAL SIGNS:  Temperature was 98.4, pulse 82, respirations 18, blood pressure 120/84.  Weight 114 pounds.  She is 5 feet 2 inches.  EENT:  Negative.  She appears adequately hydrated.  NECK:  No supraclavicular nodes.  No carotid bruits.  LUNGS:  Clear.  CARDIAC:  Normal sinus rhythm without murmur, gallop or rub.  BREASTS:  Unremarkable.  ABDOMEN:  She has got a well-healed midline incision.  She has a transverse abdominal incision below the umbilicus and it is an old scar from Pfannenstiel incision.  There is no evidence of any obvious herniae in any of these incisions.  RECTAL:  I did not do a rectal exam today as she has completed the bowel prep but stools with Hemoccults have always been negative in the office, with the last one approximately three weeks ago.  MUSCULOSKELETAL:  Unremarkable.  CNS:  Physiologic.  ADMISSION IMPRESSION:  Chronic abdominal pain with very severe functional constipation.  PLAN:  The patient will undergo exploratory laparotomy and lysis of adhesions and also an intra-abdominal near total colectomy with ileocecal-sigmoid colon anastomosis. DD:  01/25/00 TD:  01/26/00 Job: [id number]

## 2010-08-04 NOTE — Discharge Summary (Signed)
[facility]  Patient:    [name], [name] Visit Number: [id number] MRN: [id number]          Service Type: MED Location: [phone] Attending Physician:  Katy Apo. Dictated by:   Anastasio Auerbach, M.D. Admit Date:  03/30/2001 Discharge Date: 04/08/2001   CC:         Chales Salmon. Abigail Miyamoto, M.D.             Mardene Celeste Lurene Shadow, M.D.             James L. Randa Evens, M.D.             Lowell C. Catha Gosselin, M.D.             Anselm Pancoast. Zachery Dakins, M.D.                           Discharge Summary  DATE OF BIRTH: 1945-12-28  DISCHARGE DIAGNOSES:  1. Acute on chronic abdominal pain.     a. No evidence of obstruction.     b. Suspect fecal impaction.  2. Narcotic-induced gastrointestinal paresis; outpatient upper     gastrointestinal series and small-bowel follow-through confirmed, fall     2002.  3. Dehydration.  4. History of multiple abdominal surgeries.     a. Adhesion lysis x2 in 1989.     b. Adhesion lysis 1974, 1985, 1987, 1995.  5. History of chronic constipation; status post colectomy with     ileoproctostomy.  6. Iron deficiency anemia.     a. Bone marrow biopsy January 2003.     b. Received intravenous iron April 02, 2001.     c. Admission hemoglobin 10.7, MCV 75, discharge hemoglobin 9.2.  7. Mild hyponatremia, resolved.  8. Chronic musculoskeletal pain syndrome; probable fibromyalgia.  9. History of hypertension; not a significant problem this admission. 10. Osteoarthritis. 11. History of right hydronephrosis by computed tomographic scan December     2002; followup ultrasound this admission shows no hydronephrosis. 12. Allergies: TALWIN/SULFA. 13. Chronic anxiety. 14. Insomnia. 15. Slightly elevated liver function test, resolved.     a. Etiology unclear.     b. Normal on admission.  DISCHARGE MEDICATIONS:  1. Phenergan 25 mg before meals and at bedtime if needed for nausea,     dispense #30, no refills (new).  2. Reglan 10 mg q.a.c. and  h.s., dispense #120, no refills (new).  3. Neurontin 100 mg at 8 a.m., 4 p.m., and 10 p.m., goal is to titrate     this up and try to decrease narcotic use (new).  4. MiraLax 17 g daily, goal is to have 2-3 soft stools per day.  5. Senokot or Colace daily to help keep stools soft.  6. MSIR 30 mg q.4h. p.r.n. severe pain, dispense #20, no refills (new).  7. OxyContin 20 mg p.o. q.8h.  8. Elavil 25-50 mg at bedtime as needed for insomnia.  9. Protonix 40 mg q.d. (new). 10. Klonopin 1 mg p.o. q.h.s. 11. *Do not take HCTZ or lisinopril until Dr. Abigail Miyamoto tells you to (blood     pressure has been low here).  CONDITION ON DISCHARGE: Stable.  DISPOSITION: Discharge home.  RECOMMENDED ACTIVITY: As tolerated.  RECOMMENDED DIET: Small frequent meals. Stay sitting up for at least three hours after eating. No tight waisted clothing.  FOLLOWUP:  1. Dr. Abigail Miyamoto on Monday, April 14, 2001, at 9:15. Check comprehensive     metabolic panel, CBC, blood  pressure.  2. Dr. Randa Evens on Tuesday, April 29, 2001, at 2:15.  3. Dr. Leonie Man in 4-6 weeks. The patient was given his number to     contact.  CONSULTATIONS:  1. Dr. Derrell Lolling.  2. Dr. Catha Gosselin.  3. Dr. Lurene Shadow.  PROCEDURES:  1. Acute abdominal series (January 12); no evidence of obstruction.  2. KUB (January 13); question of fecal impaction.  3. Renal ultrasound (January 14); no evidence of hydronephrosis.     Minimal caliectasis suggested. No evidence of stones or other     specific abnormality.  4. PICC line placement (January 14), removed January 21.  5. IV iron administration (January 15); abdominal two-view (January 17);     decreased fecal manner.  6. Abdominal two-view (January 18); no significant stool in the colon.  HOSPITAL COURSE: #1 - ACUTE ON CHRONIC ABDOMINAL PAIN: The patient is a 65 year old, Caucasian female, with fibromyalgia and chronic abdominal pain. She presents with severe abdominal pain associated with nausea  and vomiting. For the past 2-3 months she has had progressive difficulty eating. She suffers from severe abdominal cramping after meals and often vomits up food hours after eating. Since mid summer, she has been placed on long-acting narcotic medication for both her fibromyalgia as well as chronic abdominal pain. The dose has been increased over time to help combat this. She saw Dr. Randa Evens in the fall of 2002. He did an upper GI series and a small-bowel follow-through at that time, which demonstrated marked delayed emptying of the stomach and intestines. Upon admission to the hospital, she was mildly dehydration and did receive IV fluids. She required narcotic IV pain medicines to get control of the pain. A surgical consult was obtained. Dr. Derrell Lolling did this initially and she was subsequently seen by Dr. Zachery Dakins. Without evidence of clear small-bowel obstruction on x-ray, they felt repeat surgical procedure was unnecessary. The patient herself feels strongly that her symptoms will be improved if not resolved if she has another surgery for lysis of adhesions. Again, current surgical consultation was not willing to perform surgery without definite obstruction. The patient had an appointment to see Dr. Leonie Man as an outpatient for a second opinion for her abdominal pain, but because of her prolonged hospital stay we did ask Dr. Lurene Shadow to see her while she was here. Dr. Lurene Shadow indeed did see her and recommended minimizing medications that delay motility.  We did obtain a TSH, which was normal, as he recommended. He would like to see her back in the office in 4-6 weeks and he will consider surgery if the pain persists. In the meantime, we will be aggressively trying to minimize if not get her off narcotic medications completely. I have also  added a proton pump inhibitor as well as Reglan and Phenergan. The latter two are not ideal long-term medications, but given the fact that she has  had weight loss and other issues related to her abdominal pain, I think it is reasonable to try in the short-term. She will go home on stool softeners to try to prevent any further fecal impactions.  #2 - MILDLY ELEVATED LIVER FUNCTION TEST: The patient had a ultrasound of her gallbladder on January 14. It was felt to be somewhat prominent, but this was felt secondary to her fasting state. This study was also done before the liver function test demonstrated an elevation. On January 30, 2001, the liver tests were normal. On the 18th her SGOT was 82 with an SGPT of 75 and  an alkaline phosphatase of 132. Repeated the following day, they had come down to 49, 61, and 124, respectively. Not sure of the etiology of this. At this point, I would just plan to repeat it as an outpatient.  #3 - FIBROMYALGIA/CHRONIC PAIN SYNDROME: I have started her on low-dose Neurontin, which can be titrated as an outpatient. I would also consider a Cox II inhibitor, as there may be a component of osteoarthritis. Aggressively wean narcotics. Consider Effexor as an outpatient.  #4 - HISTORY OF HYPERTENSION: Blood pressures have been low here. She had a low potassium on admission, which was replaced. For now, I will hold the HCTZ and lisinopril until she follows up with Dr. Abigail Miyamoto.  #5 - IRON DEFICIENCY ANEMIA: There were no iron stores by recent bone marrow. Dr. Catha Gosselin did see the patient here and ordered IV iron. She has very poor absorption and will likely need IV iron every four months or so. Dr. Catha Gosselin will be managing this.  #6 - MALNUTRITION: Supplements: Small frequent meals.  #7 - BORDERLINE THYROID TESTS: TSH was 0.922, which is normal. T3 was normal at 127. Free T4 was 0.88, and the normal range is given as 0.89 to 1.80. I would recommend repeating this in approximately one month. I would not start her on replacement at this time given her normal TSH.  DISCHARGE AND OTHER PERTINENT LABORATORY DATA:  Hemoglobin 9.2, MCV 77, WBC 5300, platelet count 306. Sodium 137, potassium 3.6, chloride 102, bicarb 29, glucose 87, BUN 8, creatinine 0.7, albumin 2.6. SGOT 49, SGPT 61, alkaline phosphatase 124, total bilirubin 0.6. Dictated by:   Anastasio Auerbach, M.D. Attending Physician:  Renford Dills D. DD:  04/18/01 TD:  04/18/01 Job: [id number]

## 2010-08-04 NOTE — Procedures (Signed)
[facility]  Patient:    [name], [name] Visit Number: [id number] MRN: [id number]          Service Type: END Location: ENDO Attending Physician:  Orland Mustard Dictated by:   Llana Aliment. Randa Evens, M.D. Proc. Date: 01/24/01 Admit Date:  01/24/2001 Discharge Date: 01/24/2001   CC:         Anselm Pancoast. Zachery Dakins, M.D.  Chales Salmon. Abigail Miyamoto, M.D.   Procedure Report  PROCEDURE:  Esophagogastroduodenoscopy with esophageal band of varices.  ENDOSCOPIST:  Llana Aliment. Randa Evens, M.D.  MEDICATIONS:  Cetacaine spray, fentanyl 87.5 mcg, Versed 8 mg IV.  INDICATIONS:  This is a 65 year old woman with persistent abdominal pain, constipation due to adhesions.  She has had a previous subtotal colectomy by Dr. Zachery Dakins for chronic constipation.  She had small bowel obstruction and lysis of adhesions x 3.  When we saw her she was having intermittent small bowel obstructive type symptoms, dilation. She had an upper GI and a small bowel series done showing delayed gastric emptying, delayed transit time, dilation of the duodenum, and some areas of small bowel dilation.  Since there was such a prominent duodenal dilation, endoscopy was done to rule out a gross obstruction.  INFORMED CONSENT:  The procedure had been explained to the patient and consent obtained.  DESCRIPTION OF PROCEDURE:  With the patient in the left lateral decubitus position, the Fujinon video endoscope was inserted blindly into the esophagus and advanced under direct visualization. The stomach was entered.  There was approximately 200-300 cc of liquid material as well as some minimal retained solid material in the stomach.  We went down to the full extent of the duodenum and this area we were unable to see any abnormalities to the full extent of the scope.  The scope was then withdrawn.  There was still some liquid and food material.  This was sucked out.  The stomach and duodenum  were decompressed.  The scope was withdrawn.  The patient tolerated the procedure well and was maintained on low flow oxygen throughout the procedure.  ASSESSMENT:  Gastroparesis probably due to small bowel obstruction, narcotics or some combination of all of these things.  PLAN:  We will have her hold the narcotics as much as possible.  We will go ahead and stay on a full liquid diet.  She will call if worse.  Otherwise, we will see her back in four to six weeks. Dictated by:   Llana Aliment. Randa Evens, M.D. Attending Physician:  Orland Mustard DD:  01/24/01 TD:  01/26/01 Job: [id number]

## 2010-08-07 ENCOUNTER — Encounter: Payer: Self-pay | Admitting: Pulmonary Disease

## 2010-08-07 ENCOUNTER — Other Ambulatory Visit (INDEPENDENT_AMBULATORY_CARE_PROVIDER_SITE_OTHER): Payer: Self-pay | Admitting: General Surgery

## 2010-08-07 DIAGNOSIS — K316 Fistula of stomach and duodenum: Secondary | ICD-10-CM

## 2010-08-08 ENCOUNTER — Ambulatory Visit (HOSPITAL_COMMUNITY)
Admission: RE | Admit: 2010-08-08 | Discharge: 2010-08-08 | Disposition: A | Discharge status: Home / Self Care | Payer: Medicare Other | Source: Ambulatory Visit | Attending: General Surgery | Admitting: General Surgery

## 2010-08-08 ENCOUNTER — Other Ambulatory Visit (INDEPENDENT_AMBULATORY_CARE_PROVIDER_SITE_OTHER): Payer: Self-pay | Admitting: General Surgery

## 2010-08-08 ENCOUNTER — Other Ambulatory Visit (HOSPITAL_COMMUNITY): Payer: Medicare Other

## 2010-08-08 DIAGNOSIS — K316 Fistula of stomach and duodenum: Secondary | ICD-10-CM

## 2010-08-08 DIAGNOSIS — K838 Other specified diseases of biliary tract: Secondary | ICD-10-CM | POA: Insufficient documentation

## 2010-08-08 DIAGNOSIS — N1330 Unspecified hydronephrosis: Secondary | ICD-10-CM | POA: Insufficient documentation

## 2010-08-11 ENCOUNTER — Ambulatory Visit: Payer: Medicare Other | Admitting: Pulmonary Disease

## 2010-08-11 DIAGNOSIS — J189 Pneumonia, unspecified organism: Secondary | ICD-10-CM | POA: Insufficient documentation

## 2010-08-11 NOTE — Progress Notes (Deleted)
  Subjective:    Patient ID: [name], female    DOB: 07-04-1945, 65 y.o.   MRN: [id number]  HPI 64 yo female with history of COPD/emphysema and recurrent pneumonia.    Review of Systems     Objective:   Physical Exam        Assessment & Plan:

## 2010-08-15 ENCOUNTER — Ambulatory Visit (INDEPENDENT_AMBULATORY_CARE_PROVIDER_SITE_OTHER): Payer: Medicare Other | Admitting: Family Medicine

## 2010-08-15 ENCOUNTER — Encounter: Payer: Self-pay | Admitting: Family Medicine

## 2010-08-15 DIAGNOSIS — G8929 Other chronic pain: Secondary | ICD-10-CM

## 2010-08-15 DIAGNOSIS — R300 Dysuria: Secondary | ICD-10-CM

## 2010-08-15 DIAGNOSIS — N390 Urinary tract infection, site not specified: Secondary | ICD-10-CM

## 2010-08-15 DIAGNOSIS — G894 Chronic pain syndrome: Secondary | ICD-10-CM

## 2010-08-15 LAB — POCT URINALYSIS DIPSTICK
Bilirubin, UA: NEGATIVE
Blood, UA: NEGATIVE
Glucose, UA: NEGATIVE
Ketones, UA: NEGATIVE
Nitrite, UA: NEGATIVE
Protein, UA: NEGATIVE
Spec Grav, UA: 1.005
Urobilinogen, UA: 0.2
pH, UA: 5

## 2010-08-15 MED ORDER — FENTANYL 50 MCG/HR TD PT72: 1.0000 | MEDICATED_PATCH | TRANSDERMAL | Status: DC

## 2010-08-15 MED ORDER — CYCLOBENZAPRINE HCL 10 MG PO TABS: 10.0000 mg | ORAL_TABLET | Freq: Three times a day (TID) | ORAL | Status: DC | PRN

## 2010-08-15 NOTE — Assessment & Plan Note (Signed)
Stable Pt has many problems with abd pain, colitis etc---Pt takes med as directed only rx printed for June

## 2010-08-15 NOTE — Patient Instructions (Signed)
Urinary Tract Infection (UTI)   Infections of the urinary tract can start in several places. A bladder infection (cystitis), a kidney infection (pyelonephritis), and a prostate infection (prostatitis) are different types of urinary tract infections. They usually get better if treated with medicines (antibiotics) that kill germs. Take all the medicine until it is gone. You or your child may feel better in a few days, but TAKE ALL MEDICINE or the infection may not respond and may become more difficult to treat.   HOME CARE INSTRUCTIONS   Drink enough water and fluids to keep the urine clear or pale yellow. Cranberry juice is especially recommended, in addition to large amounts of water.   Avoid caffeine, tea, and carbonated beverages. They tend to irritate the bladder.   Alcohol may irritate the prostate.   Only take over-the-counter or prescription medicines for pain, discomfort, or fever as directed by your caregiver.   FINDING OUT THE RESULTS OF YOUR TEST   Not all test results are available during your visit. If your or your child's test results are not back during the visit, make an appointment with your caregiver to find out the results. Do not assume everything is normal if you have not heard from your caregiver or the medical facility. It is important for you to follow up on all test results.   TO PREVENT FURTHER INFECTIONS:   Empty the bladder often. Avoid holding urine for long periods of time.   After a bowel movement, women should cleanse from front to back. Use each tissue only once.   Empty the bladder before and after sexual intercourse.   SEEK MEDICAL CARE IF:   There is back pain.   You or your child has an oral temperature above 100.4.   Your baby is older than 3 months with a rectal temperature of 100.5º F (38.1° C) or higher for more than 1 day.   Your or your child's problems (symptoms) are no better in 3 days. Return sooner if you or your child is getting worse.   SEEK IMMEDIATE MEDICAL CARE IF:    There is severe back pain or lower abdominal pain.   You or your child develops chills.   You or your child has an oral temperature above 100.4, not controlled by medicine.   Your baby is older than 3 months with a rectal temperature of 102º F (38.9º C) or higher.   Your baby is 3 months old or younger with a rectal temperature of 100.4º F (38º C) or higher.   There is nausea or vomiting.   There is continued burning or discomfort with urination.   MAKE SURE YOU:   Understand these instructions.   Will watch this condition.   Will get help right away if you or your child is not doing well or gets worse.   Document Released: 12/13/2004 Document Re-Released: 05/30/2009   [employer]® Patient Information ©2011 [employer].

## 2010-08-15 NOTE — Assessment & Plan Note (Signed)
Pt unable to give sample in office--She will get sterile sample at home and bring it in today so we can check it

## 2010-08-15 NOTE — Progress Notes (Signed)
Addended by: Legrand Como on: 08/15/2010 02:29 PM   Modules accepted: Orders

## 2010-08-15 NOTE — Progress Notes (Signed)
  Subjective:    Patient ID: [name], female    DOB: Sep 07, 1945, 65 y.o.   MRN: [id number]  Urinary Tract Infection  This is a new problem. The current episode started 1 to 4 weeks ago. The problem occurs every urination. The problem has been gradually worsening. The quality of the pain is described as aching. The pain is mild. There has been no fever. There is no history of pyelonephritis. Associated symptoms include flank pain and frequency. Pertinent negatives include no chills, discharge, hematuria, hesitancy, nausea, possible pregnancy, sweats, urgency or vomiting. She has tried nothing for the symptoms. Her past medical history is significant for recurrent UTIs.   Pt also here for pain f/u. No change.  Pt has been seeing cardio and GI. She has appointment with Pulm this week.  Review of Systems  Constitutional: Negative for chills.  Gastrointestinal: Negative for nausea and vomiting.  Genitourinary: Positive for frequency and flank pain. Negative for hesitancy, urgency and hematuria.  All other systems reviewed and are negative.       Objective:   Physical Exam  Constitutional: She is oriented to person, place, and time. She appears well-developed and well-nourished.  Abdominal: Soft. Bowel sounds are normal. She exhibits no distension and no mass. There is no tenderness. There is no rebound and no guarding.  Musculoskeletal: She exhibits no edema and no tenderness.  Neurological: She is alert and oriented to person, place, and time.  Skin: Skin is warm and dry.  Psychiatric: She has a normal mood and affect. Her behavior is normal.          Assessment & Plan:

## 2010-08-16 ENCOUNTER — Ambulatory Visit (INDEPENDENT_AMBULATORY_CARE_PROVIDER_SITE_OTHER): Payer: Medicare Other | Admitting: Pulmonary Disease

## 2010-08-16 ENCOUNTER — Ambulatory Visit (INDEPENDENT_AMBULATORY_CARE_PROVIDER_SITE_OTHER): Payer: Medicare Other | Admitting: Internal Medicine

## 2010-08-16 ENCOUNTER — Telehealth: Payer: Self-pay

## 2010-08-16 ENCOUNTER — Ambulatory Visit (INDEPENDENT_AMBULATORY_CARE_PROVIDER_SITE_OTHER)
Admission: RE | Admit: 2010-08-16 | Discharge: 2010-08-16 | Disposition: A | Discharge status: Home / Self Care | Payer: Medicare Other | Source: Ambulatory Visit | Attending: Pulmonary Disease | Admitting: Pulmonary Disease

## 2010-08-16 ENCOUNTER — Encounter: Payer: Self-pay | Admitting: Pulmonary Disease

## 2010-08-16 DIAGNOSIS — E538 Deficiency of other specified B group vitamins: Secondary | ICD-10-CM

## 2010-08-16 DIAGNOSIS — J189 Pneumonia, unspecified organism: Secondary | ICD-10-CM

## 2010-08-16 DIAGNOSIS — J4489 Other specified chronic obstructive pulmonary disease: Secondary | ICD-10-CM

## 2010-08-16 DIAGNOSIS — J9611 Chronic respiratory failure with hypoxia: Secondary | ICD-10-CM

## 2010-08-16 DIAGNOSIS — J449 Chronic obstructive pulmonary disease, unspecified: Secondary | ICD-10-CM

## 2010-08-16 DIAGNOSIS — J9610 Chronic respiratory failure, unspecified whether with hypoxia or hypercapnia: Secondary | ICD-10-CM

## 2010-08-16 NOTE — Progress Notes (Signed)
Subjective:    Patient ID: [name], female    DOB: 08/18/1945, 65 y.o.   MRN: [id number]  HPI 65 yo female with history of COPD/emphysema, chronic hypoxemic respiratory failure, and recurrent pneumonia.  She was seen by PCP earlier this week, and noted to have low oxygen at rest.  She is followed by Dr. Dwain Sarna with [facility] for non-healing stoma from previous feeding tube.  She may need to have surgical correction of this.  She has occasional cough with sputum.  She is not sure what the sputum looks like.  She denies fever, wheeze, or chest pain.  She has been using her oxygen at night, but not during the day.   Past Medical History  Diagnosis Date  . Small bowel obstruction   . Anemia   . HTN (hypertension)   . Osteoarthritis   . GERD (gastroesophageal reflux disease)   . CAD (coronary artery disease)   . LBBB (left bundle branch block)   . Atrial fibrillation     Dr. Roger Shelter  . 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 Fibromyalgia   . Diverticulosis   . COPD (chronic obstructive pulmonary disease)     -06/28/08 PFT FEV1 1.95 (97%), FVC 2.79 (101%), FEV1% 70, TLC 3.88 (87%), DLCO 64%  . Pneumonia - 03/09, 12/09, 02/10, 06/10  . Pleural effusion      s/p right thoracentesis 03/09  . Small bowel obstruction   . DDD (degenerative disc disease)   . Urinary incontinence   . RLS (restless legs syndrome)   . Primary dilated cardiomyopathy   . Vitamin B12 deficiency   . Gastroparesis   . Chronic nausea   . Chronic abdominal pain   . Chronic pain syndrome      Family History  Problem Relation Age of Onset  . Heart disease Father   . Stroke Mother   . Colon cancer Neg Hx      History   Social History  . Marital Status: Married    Spouse Name: N/A    Number of Children: 5  . Years of Education: N/A   Occupational History  . disabled    Social History Main Topics  . Smoking status: Former Smoker -- 1.0 packs/day for 20  years    Types: Cigarettes    Quit date: 03/19/1986  . Smokeless tobacco: Never Used  . Alcohol Use: No  . Drug Use: No  . Sexually Active: Not on file   Other Topics Concern  . Not on file   Social History Narrative   Occupation: DisabledAlcohol Use - noIllicit Drug Use - noPatient is a former smoker, quit 1988 x49yrs, <1ppd. Daily Caffeine Use     Allergies  Allergen Reactions  . Pentazocine Lactate   . Pradaxa   . Sulfonamide Derivatives   . Talwin      Outpatient Prescriptions Prior to Visit  Medication Sig Dispense Refill  . albuterol (VENTOLIN HFA) 108 (90 BASE) MCG/ACT inhaler Inhale 2 puffs into the lungs every 6 (six) hours as needed.        [name] amiodarone (PACERONE) 200 MG tablet Take 200 mg by mouth daily.        . carvedilol (COREG) 6.25 MG tablet Take 6.25 mg by mouth 2 (two) times daily with a meal.        . cyclobenzaprine (FLEXERIL) 10 MG tablet Take 1 tablet (10 mg total) by mouth 3 (three) times daily as  needed.  60 tablet  2  . denosumab (PROLIA) 60 MG/ML SOLN Inject 60 mg into the skin. Once a year      . fentaNYL (DURAGESIC - DOSED MCG/HR) 50 MCG/HR Place 1 patch (50 mcg total) onto the skin every 3 (three) days.  10 patch  0  . furosemide (LASIX) 20 MG tablet Take 40mg  of Furosemide daily alternating with 80mg  of Furosemide.  90 tablet  3  . gabapentin (NEURONTIN) 600 MG tablet Take 1 tablet (600 mg total) by mouth 4 (four) times daily.  120 tablet  2  . meloxicam (MOBIC) 15 MG tablet Take 15 mg by mouth as needed.        . metoCLOPramide (REGLAN) 5 MG tablet Take 5 mg by mouth 3 (three) times daily.        . nitroGLYCERIN (NITROSTAT) 0.4 MG SL tablet Place 0.4 mg under the tongue every 5 (five) minutes as needed.        . Nutritional Supplements (RESOURCE BREEZE PO) 2 (two) times daily.       . ondansetron (ZOFRAN) 8 MG tablet Take by mouth every 8 (eight) hours as needed.        [name] oxyCODONE-acetaminophen (ENDOCET) 10-325 MG per tablet Take 1 tablet by mouth  every 4 (four) hours as needed.  120 tablet  0  . pantoprazole (PROTONIX) 40 MG tablet Take 1 tablet by mouth once daily  90 tablet  0  . temazepam (RESTORIL) 15 MG capsule Take 1 capsule (15 mg total) by mouth at bedtime as needed.  30 capsule  0  . tiotropium (SPIRIVA HANDIHALER) 18 MCG inhalation capsule Place 18 mcg into inhaler and inhale daily.        . Gabapentin, PHN, 600 MG TABS 4 (four) times daily.         Facility-Administered Medications Prior to Visit  Medication Dose Route Frequency Provider Last Rate Last Dose  . cyanocobalamin ((VITAMIN B-12)) injection 1,000 mcg  1,000 mcg Intramuscular Q30 days Hart Carwin, MD   1,000 mcg at 07/17/10 1052   Review of Systems     Objective:   Physical Exam  Filed Vitals:   08/16/10 0934  BP: 110/60  Pulse: 72  Temp: 98.2 F (36.8 C)  TempSrc: Oral  Height: 5\' 2"  (1.575 m)  Weight: 100 lb (45.36 kg)  SpO2: 92%   General: thin.  Nose: no deformity, discharge, inflammation, or lesions  Mouth: no deformity or lesions  Neck: no JVD.  Lungs: decreased breath sounds, no wheezing or rales, no dullness  Heart: regular rhythm, normal rate, and no murmurs.  Abd: Soft, nontender Extremities: no edema  Neurologic: normal CN II-XII and strength normal.  Cervical Nodes: no significant adenopathy  Psych: alert and cooperative; normal mood and affect; normal attention span and concentration     CHEST - 2 VIEW 08/16/10:  Comparison: 12/27/2009   Findings: Lungs are hyperexpanded consistent with emphysema. There  is some patchy airspace disease at the right lung base. No  substantial pleural effusion. The cardiopericardial silhouette is  within normal limits for size. The right Port-A-Cath is again  noted with tip positioned at the distal SVC level. A left triple  lead permanent pacemaker remains in place.   IMPRESSION:  Airspace disease at the right base is suspicious for pneumonia    Assessment & Plan:   COPD She is to  continue her current inhaler regimen.  Recurrent pneumonia She has more prominent findings on chest xray in  Rt lower lobe.  She does not have clinical findings to suggest acute infection.  Will hold off on starting antibiotics for now.  Will repeat chest CT w/o contrast to further evaluate.  Will call her with results, and then decide what additional interventions are needed.  Chronic hypoxemic respiratory failure She had oxygen desaturation on exertion.  She will need to use oxygen with exertion and sleep.    Updated Medication List Outpatient Encounter Prescriptions as of 08/16/2010  Medication Sig Dispense Refill  . albuterol (VENTOLIN HFA) 108 (90 BASE) MCG/ACT inhaler Inhale 2 puffs into the lungs every 6 (six) hours as needed.        [name] amiodarone (PACERONE) 200 MG tablet Take 200 mg by mouth daily.        . carvedilol (COREG) 6.25 MG tablet Take 6.25 mg by mouth 2 (two) times daily with a meal.        . cyclobenzaprine (FLEXERIL) 10 MG tablet Take 1 tablet (10 mg total) by mouth 3 (three) times daily as needed.  60 tablet  2  . denosumab (PROLIA) 60 MG/ML SOLN Inject 60 mg into the skin. Once a year      . fentaNYL (DURAGESIC - DOSED MCG/HR) 50 MCG/HR Place 1 patch (50 mcg total) onto the skin every 3 (three) days.  10 patch  0  . furosemide (LASIX) 20 MG tablet Take 40mg  of Furosemide daily alternating with 80mg  of Furosemide.  90 tablet  3  . gabapentin (NEURONTIN) 600 MG tablet Take 1 tablet (600 mg total) by mouth 4 (four) times daily.  120 tablet  2  . meloxicam (MOBIC) 15 MG tablet Take 15 mg by mouth as needed.        . metoCLOPramide (REGLAN) 5 MG tablet Take 5 mg by mouth 3 (three) times daily.        . nitroGLYCERIN (NITROSTAT) 0.4 MG SL tablet Place 0.4 mg under the tongue every 5 (five) minutes as needed.        . Nutritional Supplements (RESOURCE BREEZE PO) 2 (two) times daily.       . ondansetron (ZOFRAN) 8 MG tablet Take by mouth every 8 (eight) hours as needed.        [name]  oxyCODONE-acetaminophen (ENDOCET) 10-325 MG per tablet Take 1 tablet by mouth every 4 (four) hours as needed.  120 tablet  0  . pantoprazole (PROTONIX) 40 MG tablet Take 1 tablet by mouth once daily  90 tablet  0  . temazepam (RESTORIL) 15 MG capsule Take 1 capsule (15 mg total) by mouth at bedtime as needed.  30 capsule  0  . tiotropium (SPIRIVA HANDIHALER) 18 MCG inhalation capsule Place 18 mcg into inhaler and inhale daily.        [name] DISCONTD: Gabapentin, PHN, 600 MG TABS 4 (four) times daily.         Facility-Administered Encounter Medications as of 08/16/2010  Medication Dose Route Frequency Provider Last Rate Last Dose  . cyanocobalamin ((VITAMIN B-12)) injection 1,000 mcg  1,000 mcg Intramuscular Q30 days Hart Carwin, MD   1,000 mcg at 07/17/10 1052

## 2010-08-16 NOTE — Patient Instructions (Signed)
Will schedule CT chest>>will call with results Use oxygen with activity and sleep Follow up in 2 months

## 2010-08-16 NOTE — Telephone Encounter (Signed)
Message copied by Arnette Norris on Wed Aug 16, 2010  3:54 PM ------      Message from: Lelon Perla      Created: Tue Aug 15, 2010  1:07 PM       Pt appointment is tomorrow with pulmonary.  Pt could not remember when it was.  Please call her and leave message.  Thanks.

## 2010-08-16 NOTE — Telephone Encounter (Signed)
Patient was seen today.    KP 

## 2010-08-16 NOTE — Assessment & Plan Note (Addendum)
She has more prominent findings on chest xray in Rt lower lobe.  She does not have clinical findings to suggest acute infection.  Will hold off on starting antibiotics for now.  Will repeat chest CT w/o contrast to further evaluate.  Will call her with results, and then decide what additional interventions are needed.

## 2010-08-16 NOTE — Assessment & Plan Note (Signed)
She had oxygen desaturation on exertion.  She will need to use oxygen with exertion and sleep.

## 2010-08-16 NOTE — Assessment & Plan Note (Signed)
She is to continue her current inhaler regimen. 

## 2010-08-17 ENCOUNTER — Telehealth: Payer: Self-pay

## 2010-08-17 ENCOUNTER — Ambulatory Visit (INDEPENDENT_AMBULATORY_CARE_PROVIDER_SITE_OTHER)
Admission: RE | Admit: 2010-08-17 | Discharge: 2010-08-17 | Disposition: A | Discharge status: Home / Self Care | Payer: Medicare Other | Source: Ambulatory Visit | Attending: Pulmonary Disease | Admitting: Pulmonary Disease

## 2010-08-17 DIAGNOSIS — J189 Pneumonia, unspecified organism: Secondary | ICD-10-CM

## 2010-08-17 IMAGING — XA IR REPLACE G/J TUBE W/ FLUORO
1 series · 4 of 4 positions shown · non-contrast
Comparison: none

CLINICAL HISTORY: 63-year-old with gastroparesis.  The patient has a
percutaneous gastrostomy tube for gastric decompression.  The
gastrostomy tube hub is broken and needs replacement.

[Series 2: fl neuro · 4 of 4 slices shown]
[im 1/4]
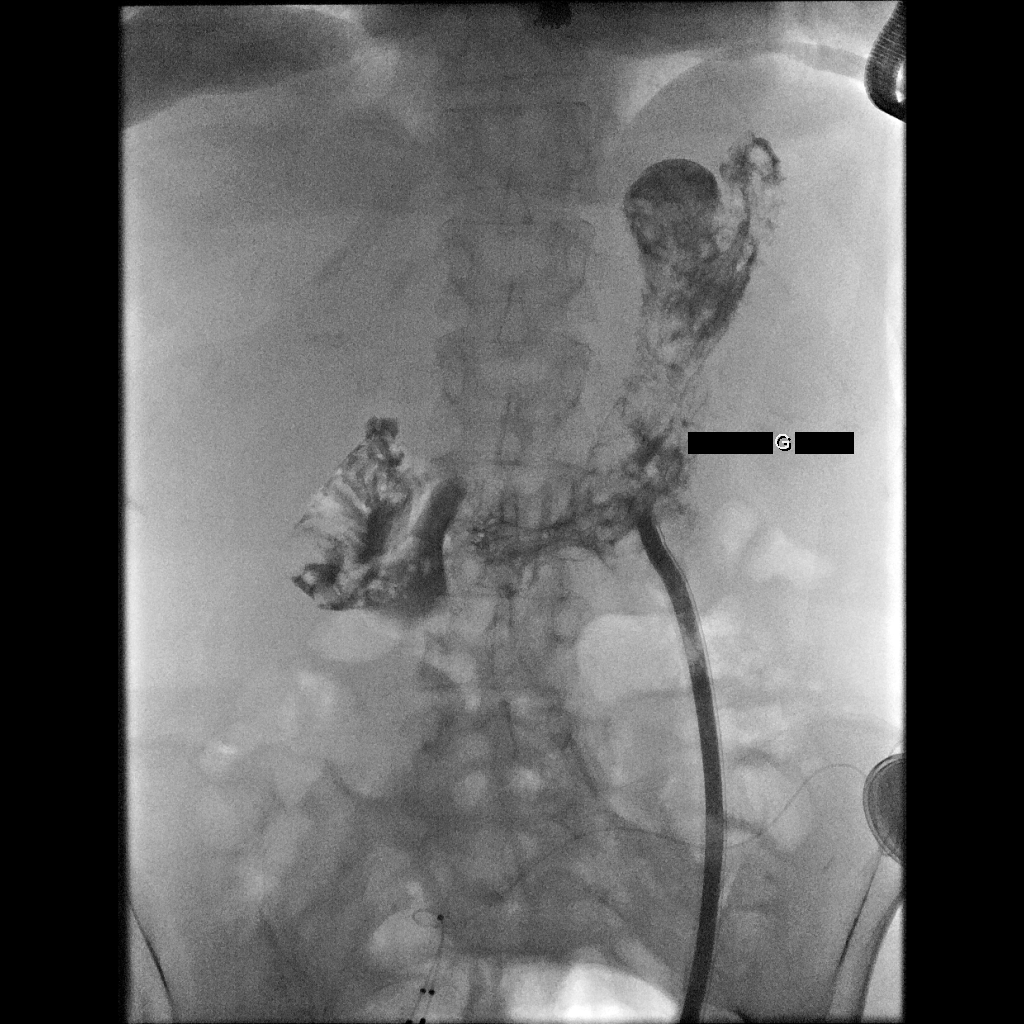
[im 2/4]
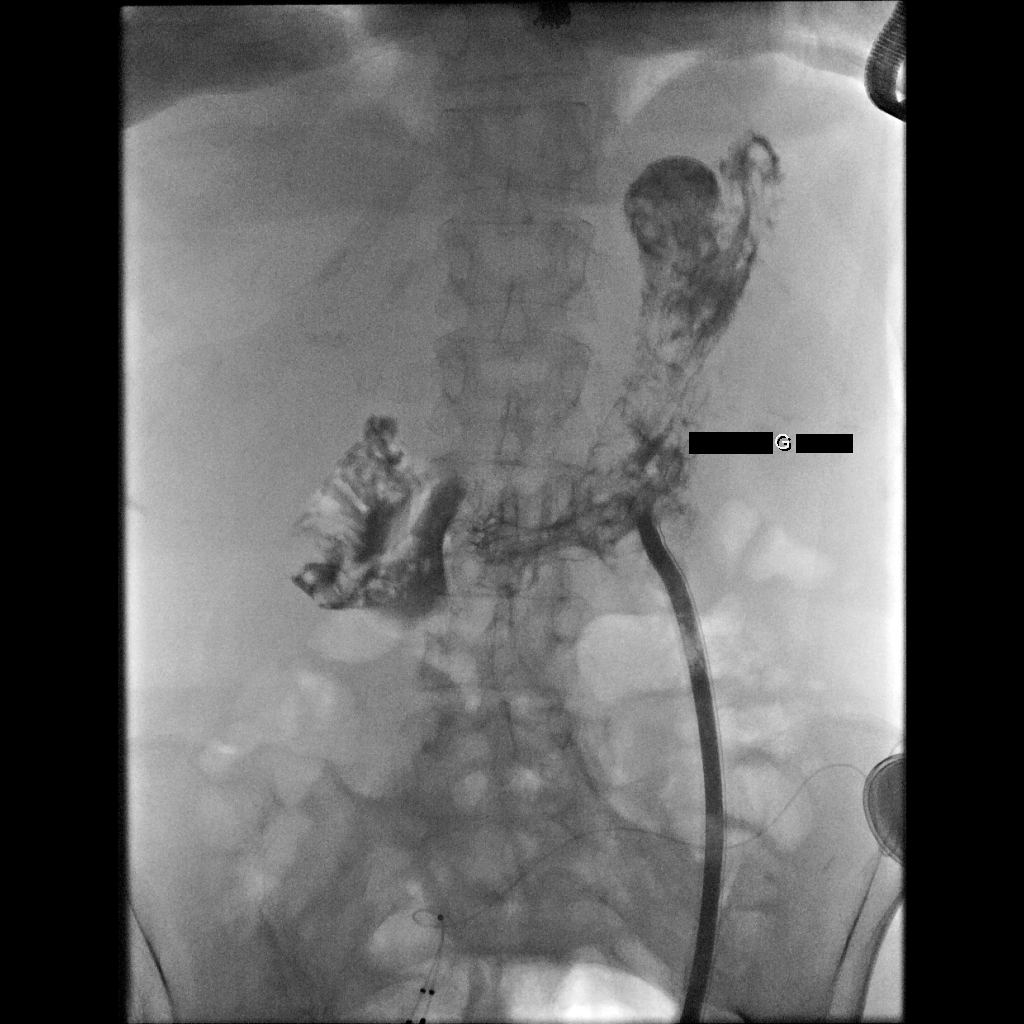
[im 3/4]
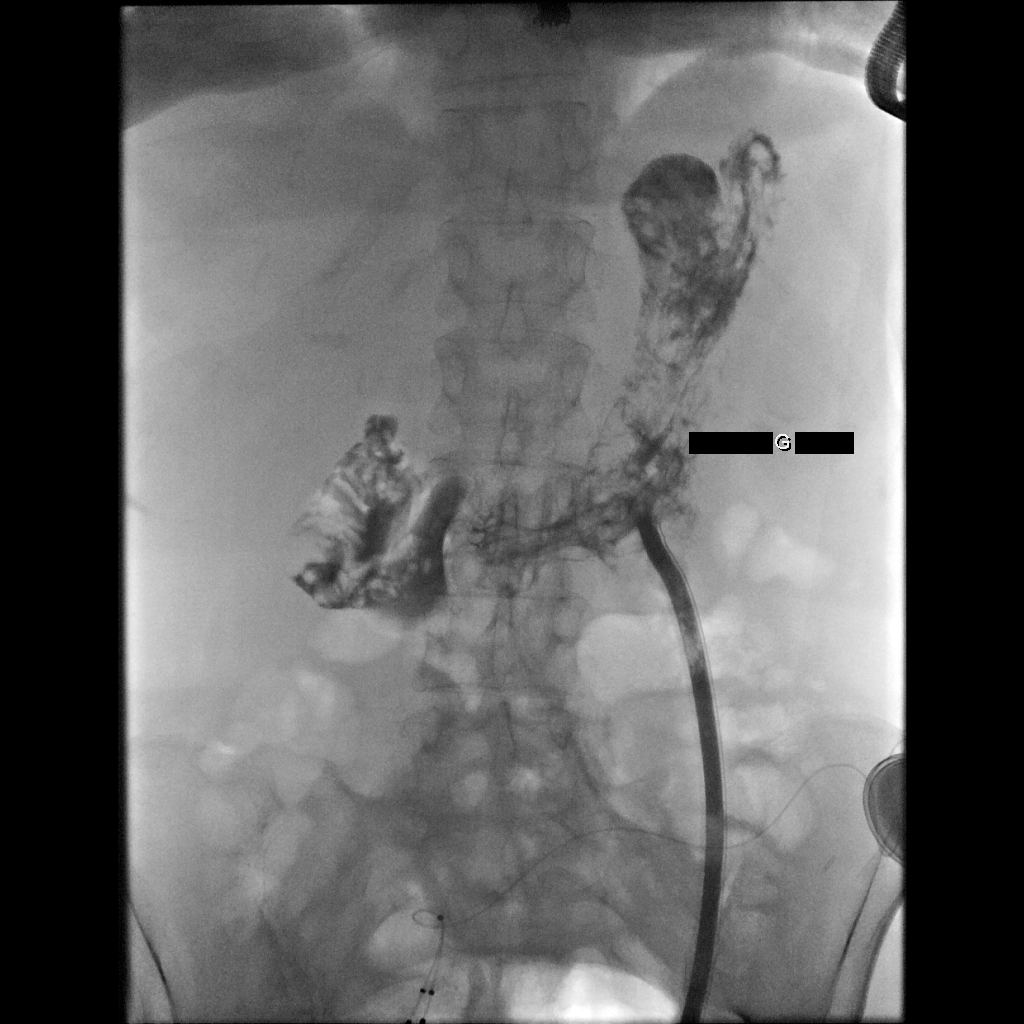
[im 4/4]
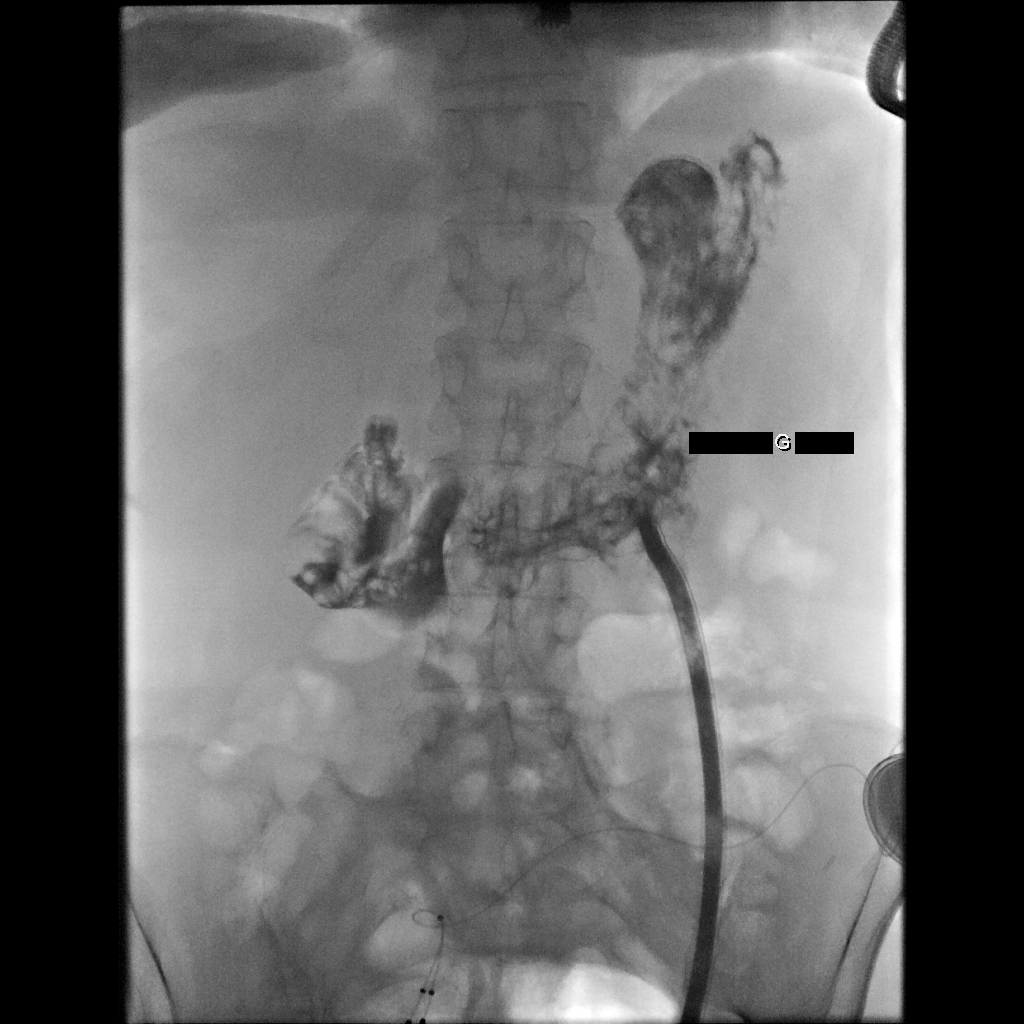

[4 of 4 positions shown; findings below may reference images not displayed]

PROCEDURE(S): EXCHANGE OF A PERCUTANEOUS GASTROSTOMY TUBE WITH
FLUOROSCOPY.

Medications:None

Moderate sedation time:None

Fluoroscopy time: 0.1 minutes

Contrast:  15 ml [id number]

Procedure:The procedure was explained to the patient.  The risks
and benefits of the procedure were discussed and the patient's
questions were addressed.  Informed consent was obtained from the
patient. The existing tube was prepped and draped in a sterile
fashion.  The balloon was deflated.  The tube was removed and a new
26-French balloon retention gastrostomy tube was advanced into the
stomach.  The balloon was inflated with 8 ml of sterile saline.
The contrast injection confirmed placement in the stomach. The
gastrostomy tube disc was cinched up against the skin.
FINDINGS: Gastrostomy tube within the stomach.
IMPRESSION: Successful exchange of the 26-French gastrostomy tube.

## 2010-08-17 MED ORDER — CIPROFLOXACIN HCL 500 MG PO TABS: 500.0000 mg | ORAL_TABLET | Freq: Two times a day (BID) | ORAL | Status: AC

## 2010-08-17 NOTE — Telephone Encounter (Signed)
Message copied by Arnette Norris on Thu Aug 17, 2010 11:46 AM ------      Message from: Lelon Perla      Created: Tue Aug 15, 2010  3:18 PM       May be UTI-- if pt is very symptomatic start cipro 500 mg 1 po bid for 5 days-----culture will take 2 days

## 2010-08-17 NOTE — Telephone Encounter (Signed)
Spoke with patient and advised of results--Rx sent to [facility]     KP

## 2010-08-18 ENCOUNTER — Telehealth: Payer: Self-pay | Admitting: *Deleted

## 2010-08-18 ENCOUNTER — Telehealth: Payer: Self-pay | Admitting: Pulmonary Disease

## 2010-08-18 LAB — URINE CULTURE: Colony Count: >=100000

## 2010-08-18 MED ORDER — AMBULATORY NON FORMULARY MEDICATION: Status: DC

## 2010-08-18 MED ORDER — LEVOFLOXACIN 500 MG PO TABS: ORAL_TABLET | ORAL | Status: DC

## 2010-08-18 NOTE — Telephone Encounter (Signed)
I have spoken with patient. She is agreeable to trying domperidone. I have sent a prescription to [facility] in [place]. I have also advised patient that she has an appointment with Dr Juanda Chance on 08/31/10 as recommended by Dr Juanda Chance. Patient verbalizes understanding of this as well.

## 2010-08-18 NOTE — Telephone Encounter (Signed)
Reviewed CT chest.  Attempted to call pt, but got voicemail.  Left info for plan, and advised to call back.  I have sent script for levaquin 500 mg qd for 7 days to her pharmacy.    Will have my triage nurse confirm with patient plan to take levaquin for one week, and then schedule ROV in two weeks from today.

## 2010-08-18 NOTE — Telephone Encounter (Signed)
Pt aware of Levaquin prescription and is scheduled for follow-up on 08/31/2010 w/ VS @ 1:30. Pt will arrive at 1:15 so she can also make her appt with Dr. Juanda Chance upstairs that day at 2:45pm. Pt will call with any further questions or concerns.

## 2010-08-18 NOTE — Telephone Encounter (Signed)
Patient phoned stated that Dr Craige Cotta left her a message and she needs to speak to his nurse. She can be reached at [phone].[name]

## 2010-08-18 NOTE — Telephone Encounter (Signed)
Dr Juanda Chance called to state that she has spoken with patient's surgeon today. Patient is having problems with her gastroparesis. Dr Juanda Chance would like her to try domperidone 10 mg three times daily and to follow up with her in 2 weeks. Patient has been scheduled for an appointment with Dr Juanda Chance on 08/31/10 @ 2:30 pm. I have left a voicemail for patient to call back to discuss.

## 2010-08-18 NOTE — Telephone Encounter (Signed)
Will forward to dr sood for results

## 2010-08-20 NOTE — Telephone Encounter (Signed)
agree

## 2010-08-23 ENCOUNTER — Telehealth: Payer: Self-pay | Admitting: *Deleted

## 2010-08-23 NOTE — Telephone Encounter (Addendum)
Left message to call office   Message copied by [facility] on Wed Aug 23, 2010  8:57 AM ------      Message from: Lelon Perla      Created: Sat Aug 19, 2010 10:45 AM       + UTi--- pt on abx

## 2010-08-23 NOTE — Telephone Encounter (Signed)
Message copied by Verdene Rio on Wed Aug 23, 2010  9:09 AM ------      Message from: Lelon Perla      Created: Sat Aug 19, 2010 10:45 AM       + UTi--- pt on abx

## 2010-08-28 ENCOUNTER — Encounter: Payer: Self-pay | Admitting: Pulmonary Disease

## 2010-08-29 ENCOUNTER — Other Ambulatory Visit: Payer: Self-pay | Admitting: Family Medicine

## 2010-08-29 MED ORDER — OXYCODONE-ACETAMINOPHEN 10-325 MG PO TABS: 1.0000 | ORAL_TABLET | ORAL | Status: DC | PRN

## 2010-08-29 NOTE — Telephone Encounter (Signed)
Last seen 08/15/10 and filled 07/25/10 please advise    KP

## 2010-08-29 NOTE — Telephone Encounter (Signed)
Pt aware and stated she completed meds and not having any issues     KP

## 2010-08-30 NOTE — Telephone Encounter (Signed)
Pt aware R ready for pick up     KP

## 2010-08-31 ENCOUNTER — Ambulatory Visit (INDEPENDENT_AMBULATORY_CARE_PROVIDER_SITE_OTHER)
Admission: RE | Admit: 2010-08-31 | Discharge: 2010-08-31 | Disposition: A | Discharge status: Home / Self Care | Payer: Medicare Other | Source: Ambulatory Visit | Attending: Pulmonary Disease | Admitting: Pulmonary Disease

## 2010-08-31 ENCOUNTER — Ambulatory Visit (INDEPENDENT_AMBULATORY_CARE_PROVIDER_SITE_OTHER): Payer: Medicare Other | Admitting: Pulmonary Disease

## 2010-08-31 ENCOUNTER — Ambulatory Visit (INDEPENDENT_AMBULATORY_CARE_PROVIDER_SITE_OTHER): Payer: Medicare Other | Admitting: Internal Medicine

## 2010-08-31 ENCOUNTER — Encounter: Payer: Self-pay | Admitting: Internal Medicine

## 2010-08-31 ENCOUNTER — Encounter: Payer: Self-pay | Admitting: Pulmonary Disease

## 2010-08-31 VITALS — BP 120/78 | HR 76 | Temp 98.2°F | Ht 62.0 in | Wt 96.0 lb

## 2010-08-31 VITALS — BP 112/62 | HR 60 | Ht 62.0 in | Wt 95.0 lb

## 2010-08-31 DIAGNOSIS — J9611 Chronic respiratory failure with hypoxia: Secondary | ICD-10-CM

## 2010-08-31 DIAGNOSIS — D509 Iron deficiency anemia, unspecified: Secondary | ICD-10-CM

## 2010-08-31 DIAGNOSIS — J449 Chronic obstructive pulmonary disease, unspecified: Secondary | ICD-10-CM

## 2010-08-31 DIAGNOSIS — R633 Feeding difficulties, unspecified: Secondary | ICD-10-CM

## 2010-08-31 DIAGNOSIS — J189 Pneumonia, unspecified organism: Secondary | ICD-10-CM

## 2010-08-31 DIAGNOSIS — K56609 Unspecified intestinal obstruction, unspecified as to partial versus complete obstruction: Secondary | ICD-10-CM

## 2010-08-31 DIAGNOSIS — J9610 Chronic respiratory failure, unspecified whether with hypoxia or hypercapnia: Secondary | ICD-10-CM

## 2010-08-31 NOTE — Patient Instructions (Addendum)
I have spoken to [facility], a pharmacist at [facility] and explained that patient will need TPN with at least 2,000 calories daily. De Nurse will start this process and would like recent labs and office note sent to [phone]. Patient will only be having small sips of clear liquids by mouth  Eston Esters, Registered Dietician with [facility] has called and spoken to patient. They will try to start patient on TPN tomorrow.  CC: Dr  Dwain Sarna

## 2010-08-31 NOTE — Assessment & Plan Note (Signed)
She is to continue her current inhaler regimen. 

## 2010-08-31 NOTE — Progress Notes (Signed)
Subjective:    Patient ID: [name], female    DOB: July 02, 1945, 65 y.o.   MRN: [id number]  HPI 65 yo female with history of COPD/emphysema, chronic hypoxemic respiratory failure, and recurrent pneumonia.  Her recent CT chest showed increase Rt sided infiltrate.  She finished course of levaquin.  She feels better.  She is not having as much cough or sputum.  She denies chest pain, wheeze, fever, or sweats.  She feels that using he oxygen more has helped.  Past Medical History  Diagnosis Date  . Small bowel obstruction   . Anemia   . HTN (hypertension)   . Osteoarthritis   . GERD (gastroesophageal reflux disease)   . CAD (coronary artery disease)   . LBBB (left bundle branch block)   . Atrial fibrillation     Dr. Roger Shelter  . 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 Fibromyalgia   . Diverticulosis   . COPD (chronic obstructive pulmonary disease)     -06/28/08 PFT FEV1 1.95 (97%), FVC 2.79 (101%), FEV1% 70, TLC 3.88 (87%), DLCO 64%  . Pneumonia - 03/09, 12/09, 02/10, 06/10  . Pleural effusion      s/p right thoracentesis 03/09  . Small bowel obstruction   . DDD (degenerative disc disease)   . Urinary incontinence   . RLS (restless legs syndrome)   . Primary dilated cardiomyopathy   . Vitamin B12 deficiency   . Gastroparesis   . Chronic nausea   . Chronic abdominal pain   . Chronic pain syndrome      Family History  Problem Relation Age of Onset  . Heart disease Father   . Stroke Mother   . Colon cancer Neg Hx      History   Social History  . Marital Status: Married    Spouse Name: N/A    Number of Children: 5  . Years of Education: N/A   Occupational History  . disabled    Social History Main Topics  . Smoking status: Former Smoker -- 1.0 packs/day for 20 years    Types: Cigarettes    Quit date: 03/19/1986  . Smokeless tobacco: Never Used  . Alcohol Use: No  . Drug Use: No  . Sexually Active: Not on file    Other Topics Concern  . Not on file   Social History Narrative   Occupation: DisabledAlcohol Use - noIllicit Drug Use - noPatient is a former smoker, quit 1988 x90yrs, <1ppd. Daily Caffeine Use     Allergies  Allergen Reactions  . Pentazocine Lactate   . Pradaxa   . Sulfonamide Derivatives   . Talwin      Outpatient Prescriptions Prior to Visit  Medication Sig Dispense Refill  . albuterol (VENTOLIN HFA) 108 (90 BASE) MCG/ACT inhaler Inhale 2 puffs into the lungs every 6 (six) hours as needed.        . AMBULATORY NON FORMULARY MEDICATION Domperidone 10 mg Take 1 tablet by mouth three times daily before meals  240 tablet  0  . amiodarone (PACERONE) 200 MG tablet Take 200 mg by mouth daily.        . carvedilol (COREG) 6.25 MG tablet Take 6.25 mg by mouth 2 (two) times daily with a meal.        . cyclobenzaprine (FLEXERIL) 10 MG tablet Take 1 tablet (10 mg total) by mouth 3 (three) times daily as needed.  60 tablet  2  . denosumab (PROLIA)  60 MG/ML SOLN Inject 60 mg into the skin. Once a year      . fentaNYL (DURAGESIC - DOSED MCG/HR) 50 MCG/HR Place 1 patch (50 mcg total) onto the skin every 3 (three) days.  10 patch  0  . furosemide (LASIX) 20 MG tablet Take 40mg  of Furosemide daily alternating with 80mg  of Furosemide.  90 tablet  3  . gabapentin (NEURONTIN) 600 MG tablet Take 1 tablet (600 mg total) by mouth 4 (four) times daily.  120 tablet  2  . meloxicam (MOBIC) 15 MG tablet Take 15 mg by mouth as needed.        . metoCLOPramide (REGLAN) 5 MG tablet Take 5 mg by mouth 3 (three) times daily.        . nitroGLYCERIN (NITROSTAT) 0.4 MG SL tablet Place 0.4 mg under the tongue every 5 (five) minutes as needed.        . Nutritional Supplements (RESOURCE BREEZE PO) 2 (two) times daily.       . ondansetron (ZOFRAN) 8 MG tablet Take by mouth every 8 (eight) hours as needed.        [name] oxyCODONE-acetaminophen (ENDOCET) 10-325 MG per tablet Take 1 tablet by mouth every 4 (four) hours as  needed.  120 tablet  0  . pantoprazole (PROTONIX) 40 MG tablet Take 1 tablet by mouth once daily  90 tablet  0  . temazepam (RESTORIL) 15 MG capsule Take 1 capsule (15 mg total) by mouth at bedtime as needed.  30 capsule  0  . tiotropium (SPIRIVA HANDIHALER) 18 MCG inhalation capsule Place 18 mcg into inhaler and inhale daily.        [name] levofloxacin (LEVAQUIN) 500 MG tablet One pill daily for 7 days  7 tablet  0   Facility-Administered Medications Prior to Visit  Medication Dose Route Frequency Provider Last Rate Last Dose  . cyanocobalamin ((VITAMIN B-12)) injection 1,000 mcg  1,000 mcg Intramuscular Q30 days Hart Carwin, MD   1,000 mcg at 07/17/10 1052      Review of Systems     Objective:   Physical Exam  BP 120/78  Pulse 76  Temp(Src) 98.2 F (36.8 C) (Oral)  Ht 5\' 2"  (1.575 m)  Wt 96 lb (43.545 kg)  BMI 17.56 kg/m2  SpO2 96%  General: thin.  Nose: no deformity, discharge, inflammation, or lesions  Mouth: no deformity or lesions  Neck: no JVD.  Lungs: decreased breath sounds, no wheezing or rales, no dullness  Heart: regular rhythm, normal rate, and no murmurs.  Abd: Soft, nontender  Extremities: no edema  Neurologic: normal CN II-XII and strength normal.  Cervical Nodes: no significant adenopathy  Psych: alert and cooperative; normal mood and affect; normal attention span and concentration      Assessment & Plan:   Recurrent pneumonia She has finished course of antibiotics.  Will repeat chest xray today and call her with results.  Explained if her infiltrates progress she may need bronchoscopy.  COPD She is to continue her current inhaler regimen.    Chronic hypoxemic respiratory failure She had oxygen desaturation on exertion.  She will need to use oxygen with exertion and sleep.      Updated Medication List Outpatient Encounter Prescriptions as of 08/31/2010  Medication Sig Dispense Refill  . albuterol (VENTOLIN HFA) 108 (90 BASE) MCG/ACT inhaler  Inhale 2 puffs into the lungs every 6 (six) hours as needed.        . AMBULATORY NON FORMULARY MEDICATION Domperidone  10 mg Take 1 tablet by mouth three times daily before meals  240 tablet  0  . amiodarone (PACERONE) 200 MG tablet Take 200 mg by mouth daily.        . carvedilol (COREG) 6.25 MG tablet Take 6.25 mg by mouth 2 (two) times daily with a meal.        . cyclobenzaprine (FLEXERIL) 10 MG tablet Take 1 tablet (10 mg total) by mouth 3 (three) times daily as needed.  60 tablet  2  . denosumab (PROLIA) 60 MG/ML SOLN Inject 60 mg into the skin. Once a year      . fentaNYL (DURAGESIC - DOSED MCG/HR) 50 MCG/HR Place 1 patch (50 mcg total) onto the skin every 3 (three) days.  10 patch  0  . furosemide (LASIX) 20 MG tablet Take 40mg  of Furosemide daily alternating with 80mg  of Furosemide.  90 tablet  3  . gabapentin (NEURONTIN) 600 MG tablet Take 1 tablet (600 mg total) by mouth 4 (four) times daily.  120 tablet  2  . meloxicam (MOBIC) 15 MG tablet Take 15 mg by mouth as needed.        . metoCLOPramide (REGLAN) 5 MG tablet Take 5 mg by mouth 3 (three) times daily.        . nitroGLYCERIN (NITROSTAT) 0.4 MG SL tablet Place 0.4 mg under the tongue every 5 (five) minutes as needed.        . Nutritional Supplements (RESOURCE BREEZE PO) 2 (two) times daily.       . ondansetron (ZOFRAN) 8 MG tablet Take by mouth every 8 (eight) hours as needed.        [name] oxyCODONE-acetaminophen (ENDOCET) 10-325 MG per tablet Take 1 tablet by mouth every 4 (four) hours as needed.  120 tablet  0  . pantoprazole (PROTONIX) 40 MG tablet Take 1 tablet by mouth once daily  90 tablet  0  . temazepam (RESTORIL) 15 MG capsule Take 1 capsule (15 mg total) by mouth at bedtime as needed.  30 capsule  0  . tiotropium (SPIRIVA HANDIHALER) 18 MCG inhalation capsule Place 18 mcg into inhaler and inhale daily.        [name] DISCONTD: levofloxacin (LEVAQUIN) 500 MG tablet One pill daily for 7 days  7 tablet  0   Facility-Administered  Encounter Medications as of 08/31/2010  Medication Dose Route Frequency Provider Last Rate Last Dose  . cyanocobalamin ((VITAMIN B-12)) injection 1,000 mcg  1,000 mcg Intramuscular Q30 days Hart Carwin, MD   1,000 mcg at 07/17/10 1052

## 2010-08-31 NOTE — Assessment & Plan Note (Signed)
She has finished course of antibiotics.  Will repeat chest xray today and call her with results.  Explained if her infiltrates progress she may need bronchoscopy.

## 2010-08-31 NOTE — Assessment & Plan Note (Signed)
She had oxygen desaturation on exertion.  She will need to use oxygen with exertion and sleep. 

## 2010-08-31 NOTE — Progress Notes (Signed)
[name] 1945/07/20 MRN [id number]     History of Present Illness:  This is a 65 year old, white female with post gastrostomy gastrocutaneous fistula. She is status post failed attempt for closure of the fistula. She has a chronic low-grade small bowel obstruction. She is status post exploratory laparotomy and lysis of adhesions one year ago and status post recent upper GI bleed due to pradaxa. She has oxygen-dependent COPD, atrial fibrillation, B12 deficiency and a biventricular pacemaker. I have spoken to Dr. Dwain Sarna,  her surgeon,  who feels that surgical closure of the gastrocutaneous fistula is too risky and would require an extensive operation. We have agreed on  conservative treatment by placing the patient on bowel rest, central TNA and domperidone 10 mg 3 times a day. By improving her nutritional status, we hope to achieve healing of the gastrocutaneous fistula which is draining small amounts on a 4 x 4 pad which patient aaplies several times a day over the gastrostomy site.   Past Medical History  Diagnosis Date  . Small bowel obstruction   . Anemia   . HTN (hypertension)   . Osteoarthritis   . GERD (gastroesophageal reflux disease)   . CAD (coronary artery disease)   . LBBB (left bundle branch block)   . Atrial fibrillation     Dr. Roger Shelter  . 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 Fibromyalgia   . Diverticulosis   . COPD (chronic obstructive pulmonary disease)     -06/28/08 PFT FEV1 1.95 (97%), FVC 2.79 (101%), FEV1% 70, TLC 3.88 (87%), DLCO 64%  . Pneumonia - 03/09, 12/09, 02/10, 06/10  . Pleural effusion      s/p right thoracentesis 03/09  . Small bowel obstruction   . DDD (degenerative disc disease)   . Urinary incontinence   . RLS (restless legs syndrome)   . Primary dilated cardiomyopathy   . Vitamin B12 deficiency   . Gastroparesis   . Chronic nausea   . Chronic abdominal pain   . Chronic pain syndrome   .  Gastrocutaneous fistula    Past Surgical History  Procedure Date  . Colectomy     Intestinal resection (5times),S/P PARTIAL COLECTOMY 2001  . Appendectomy   . Bladder surgery   . Tonsillectomy   . Picc line   . Total abdominal hysterectomy   . Pacemaker placement     PPM-Medtronic  . Peg placement   . Abdominal adhesion surgery     numerous surgeries  . Peg removed     with complications    reports that she quit smoking about 24 years ago. Her smoking use included Cigarettes. She has a 20 pack-year smoking history. She has never used smokeless tobacco. She reports that she does not drink alcohol or use illicit drugs. family history includes Heart disease in her father and Stroke in her mother.  There is no history of Colon cancer. Allergies  Allergen Reactions  . Pentazocine Lactate   . Pradaxa   . Sulfonamide Derivatives   . Talwin         Review of Systems: Denies dysphagia chest pain fever positive for diarrhea and abdominal pain  The remainder of the 10  point ROS is negative except as outlined in H&P   Assessment and Plan:  Problem #1 Chronic partial small bowel obstruction and gastrocutaneous fistula from failure of a percutaneous gastrostomy closure. She will be starting on home TPN at night and bowel rest allowing  only clear liquids by mouth. She will continue on domperidone 10 mg 3 times a day as a promotility agent. We will monitor her nutritional status, her weight and her electrolytes. I suspect she will have to stay on TPN for several months. I spent some time with the patient and her husband today discussing it. She already has a Port-A-Cath.   08/31/2010 [name]

## 2010-08-31 NOTE — Patient Instructions (Signed)
Chest xray today>>will call with results Follow up in 2 months 

## 2010-09-01 ENCOUNTER — Telehealth: Payer: Self-pay | Admitting: Pulmonary Disease

## 2010-09-01 NOTE — Telephone Encounter (Signed)
Will have my nurse inform pt that chest looks improved.  No need for additional antibiotics.  No change to current treatment plan.

## 2010-09-04 NOTE — Telephone Encounter (Signed)
Pt aware cxr looks good per Dr. Craige Cotta. She will call if she has any problems or questions.

## 2010-09-07 ENCOUNTER — Telehealth: Payer: Self-pay | Admitting: *Deleted

## 2010-09-07 NOTE — Telephone Encounter (Signed)
We have received a fax from [facility] with patient's cumulative CMET and CBC from 08/31/10, 09/02/10, and 09/06/10. Patient's hemoglobin has steadily decreased from 9.1 on 08/31/10 (which seems to be her norm) to 8.8 on 09/02/10 and is now down to 7.6 l as of the evening of 09/06/10. I have shown the results to Dr Russella Dar (as Dr Juanda Chance is out of the office). With his limited knowledge of the patient's case (as he is not her primary gastroenterologist), he suggests that patient has a repeat CBC and see a provider in the office. I have spoken to Willette Cluster, NP to explain the case briefly (as she has no openings available tomorrow). She states that if the patient's hemoglobin is still in the 7 range today, that she will add her on tomorrow to be seen. If the level has gone up, we will discuss further at that time. I have called and spoken to [facility], pharmacist with [facility] and he states that they will repeat CBC today. I have spoken to Ms.Perno to explain that her hemoglobin has decreased over the last week. Although some of that is likely related to replenishing her fluids, we need to be careful that her levels dont get out of control. Patient states that she has not seen any bleeding per rectum or dark black stools. She is not experiencing any pain out of the norm for her. I have advised patient of the plan and she agrees. Rene Kocher, RN will follow up with this case tomorrow as I will be out of the office.

## 2010-09-08 ENCOUNTER — Encounter: Payer: Self-pay | Admitting: Internal Medicine

## 2010-09-08 NOTE — Telephone Encounter (Signed)
Received labs from [facility]. Labs are stable and patient is feeling fine. No increased SOB, bleeding or dark bowel movements, Per Willette Cluster, NP, schedule patient to be seen on 09/12/10. Patient notified and scheduled patient at 8:30 AM on 09/12/10.

## 2010-09-12 ENCOUNTER — Ambulatory Visit (INDEPENDENT_AMBULATORY_CARE_PROVIDER_SITE_OTHER): Payer: Medicare Other | Admitting: Nurse Practitioner

## 2010-09-12 ENCOUNTER — Other Ambulatory Visit: Payer: Self-pay | Admitting: Internal Medicine

## 2010-09-12 ENCOUNTER — Ambulatory Visit (HOSPITAL_COMMUNITY): Payer: Medicare Other | Attending: Internal Medicine

## 2010-09-12 ENCOUNTER — Encounter: Payer: Self-pay | Admitting: Nurse Practitioner

## 2010-09-12 VITALS — BP 116/64 | HR 64 | Ht 62.0 in | Wt 106.6 lb

## 2010-09-12 DIAGNOSIS — D649 Anemia, unspecified: Secondary | ICD-10-CM

## 2010-09-12 DIAGNOSIS — K56609 Unspecified intestinal obstruction, unspecified as to partial versus complete obstruction: Secondary | ICD-10-CM

## 2010-09-12 LAB — IRON AND TIBC
Iron: 16 ug/dL — ABNORMAL LOW (ref 42–135)
Saturation Ratios: 8 % — ABNORMAL LOW (ref 20–55)
TIBC: 196 ug/dL — ABNORMAL LOW (ref 250–470)
UIBC: 180 ug/dL

## 2010-09-12 LAB — VITAMIN B12: Vitamin B-12: 606 pg/mL (ref 211–911)

## 2010-09-12 LAB — FERRITIN: Ferritin: 244 ng/mL (ref 10–291)

## 2010-09-12 NOTE — Patient Instructions (Signed)
Go to the [facility] front doors and ask for short stay. Come to [place] on Friday 6-29.  to our lab in the basement.

## 2010-09-12 NOTE — Progress Notes (Signed)
[name] [id number] 16-May-1945   HISTORY OR PRESENT ILLNESS : [name] is a 65 year old female with multiple medical problems. She is known to Dr. Juanda Chance for history of chronic low grade bowel obstructions. She also had an UGI bleed in Oct. 2011 secondary to esophageal ulcers in setting of Pradaxa. In Feb 2011 patient had exploratory laparotomy with lysis of adhesions and gastrostomy tube placement for decompression. Because of chronic leaking / malfunctioning the tube replaced Dec. 2011 but ultimately had to be removed in March 2012. Patient has since been found to have a post gastrostomy gastrocutaneous fistula. She had surgery in April to close the fistula but procedure was unsuccessful. Patient was just seen on the 14th of this month for further evaluation and management of her fistula. The plan was for conservative treatment by placing the patient on bowel rest, TNA and domperidone. She is here for evaluation of a declining hemoglobin (home health drawing weekly labs). No overt GI bleeding. No abdominal pain, no nausea.  Current Medications, Allergies, Past Medical History, Past Surgical History, Family History and Social History were reviewed in [employer] record.   PHYSICAL EXAMINATION : General: Well developed  female in no acute distress Head: Normocephalic and atraumatic Eyes:  sclerae anicteric,conjunctive pink. Ears: Normal auditory acuity Mouth: No deformity or lesions Neck: Supple, no masses.  Lungs: Clear throughout to auscultation Heart: Regular rate and rhythm; no murmurs heard Abdomen: Soft, nondistended, nontender. No masses or hepatomegaly noted. Normal bowel sounds Rectal: Light brown, heme negative stool.  Musculoskeletal: Symmetrical with no gross deformities  Skin: No lesions on visible extremities Extremities: No edema or deformities noted Neurological: Alert oriented x 4, grossly nonfocal Cervical Nodes:  No significant cervical  adenopathy Psychological:  Alert and cooperative. Normal mood and affect  ASSESSMENT AND PLAN :

## 2010-09-13 LAB — CROSSMATCH
ABO/RH(D): A NEG
Antibody Screen: NEGATIVE
Unit division: 0
Unit division: 0

## 2010-09-13 LAB — FOLATE RBC: RBC Folate: 825 ng/mL — ABNORMAL HIGH (ref >=366)

## 2010-09-14 ENCOUNTER — Ambulatory Visit: Payer: Medicare Other | Admitting: Pulmonary Disease

## 2010-09-14 DIAGNOSIS — D638 Anemia in other chronic diseases classified elsewhere: Secondary | ICD-10-CM | POA: Insufficient documentation

## 2010-09-14 IMAGING — CR DG CHEST 2V
2 series · 2 of 2 positions shown · non-contrast
Comparison: 05/01/2009

CLINICAL DATA: Pneumonia.  COPD.  Former smoker.  Hypertension.
Malabsorption syndrome.

CHEST - 2 VIEW

[view not recorded (1 of 2)]
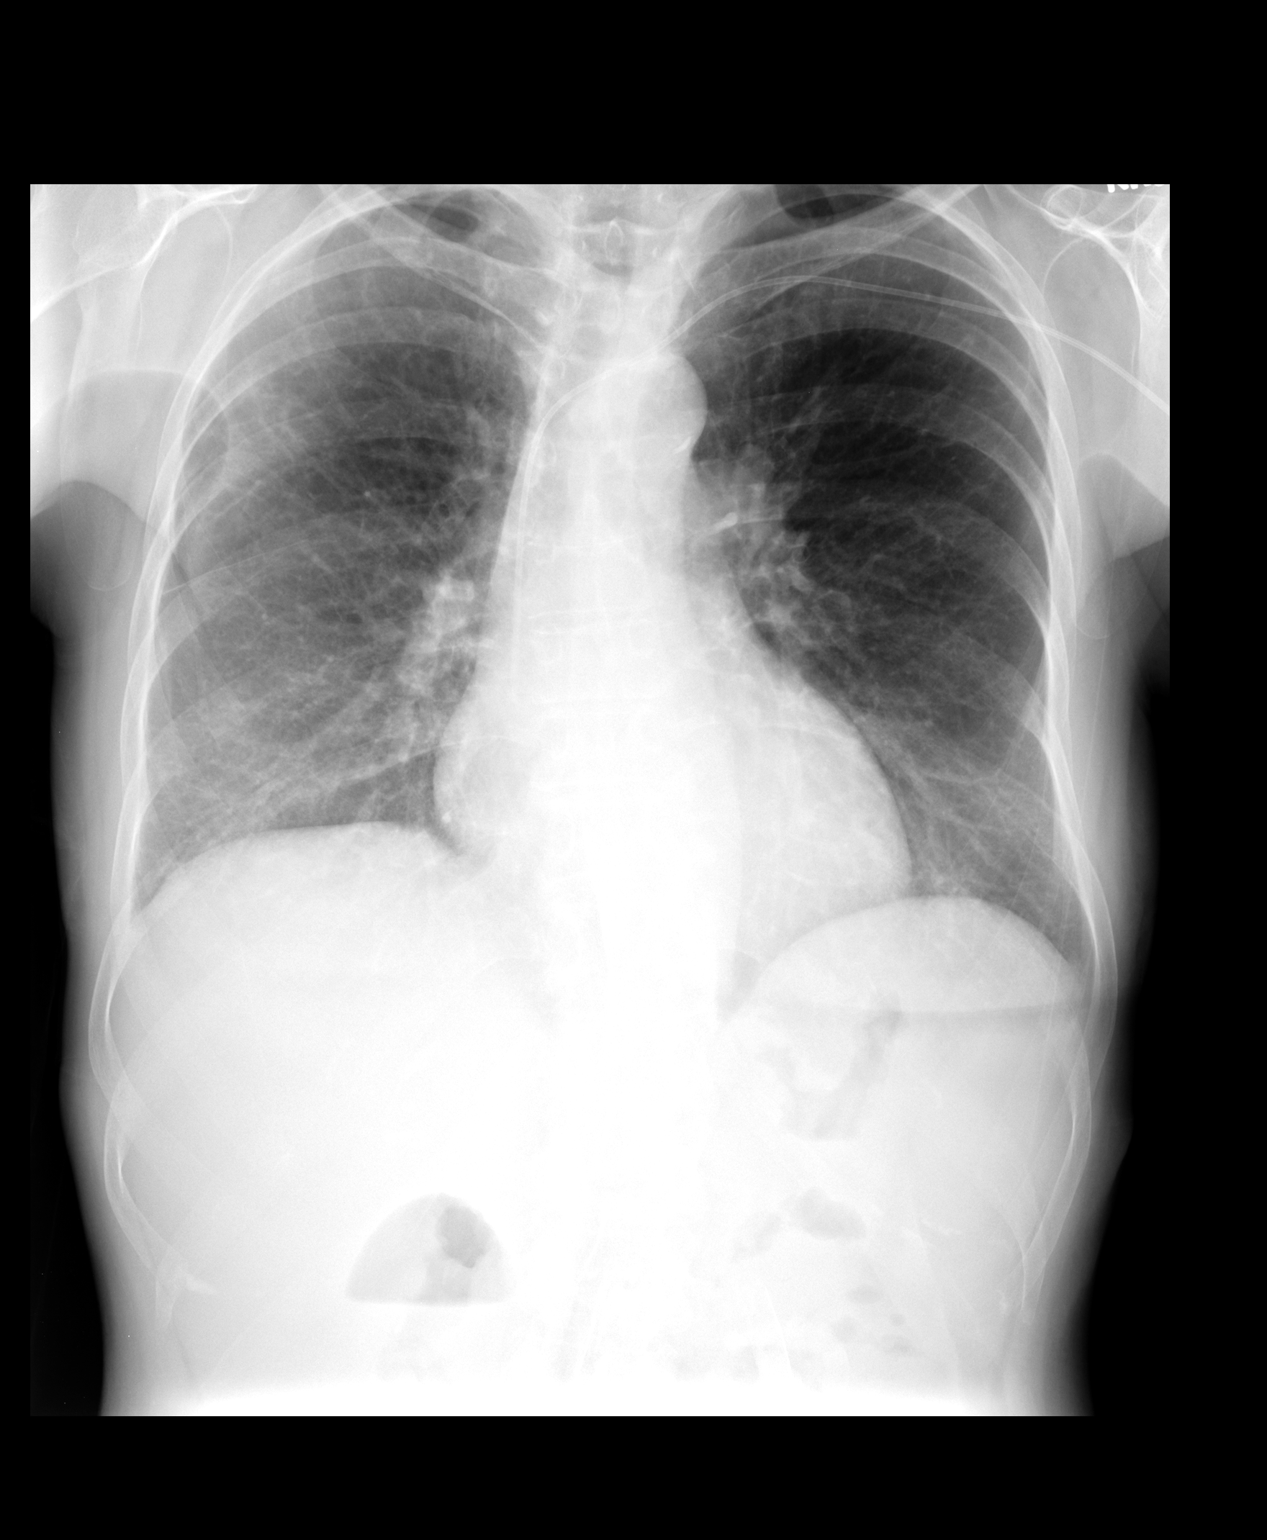

[view not recorded (2 of 2)]
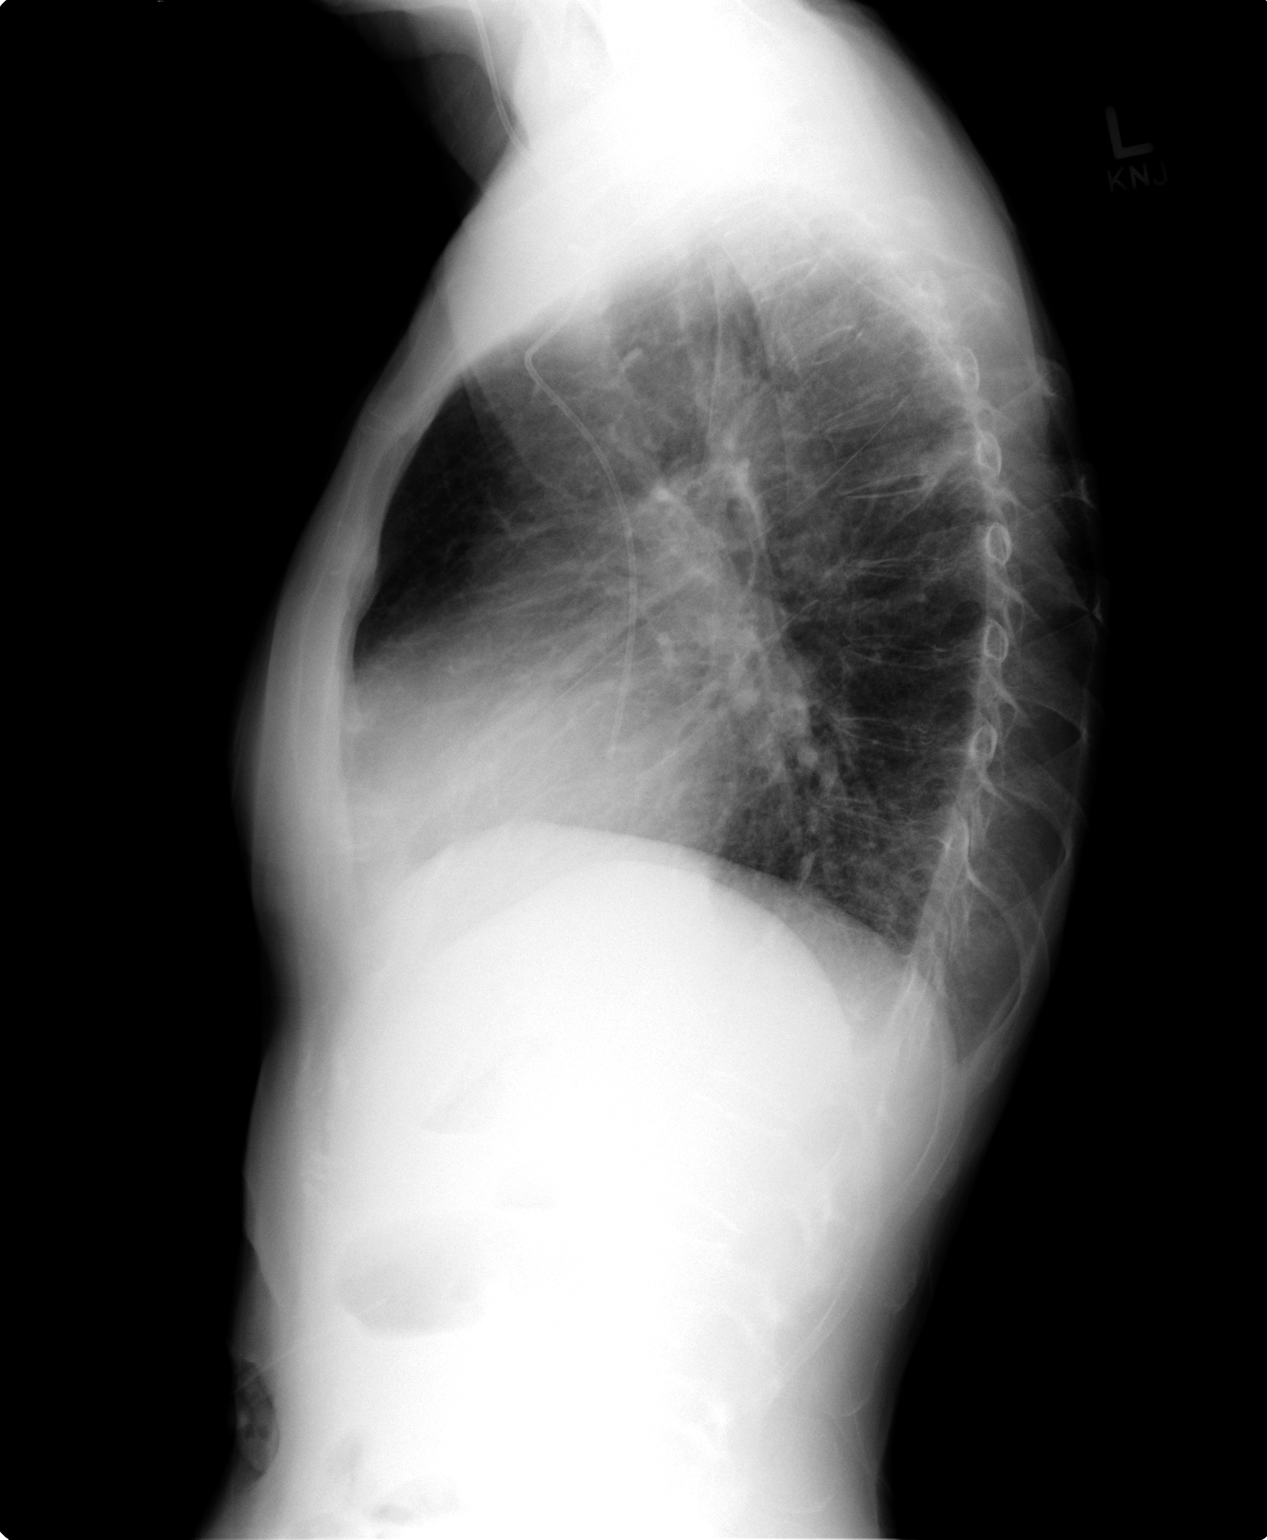

[2 of 2 positions shown; findings below may reference images not displayed]

FINDINGS: Left arm PICC line is seen in appropriate position.  Both
lungs are clear.  No evidence of pleural effusion.  Heart size and
mediastinal contours are within normal limits.
IMPRESSION: No active disease.

## 2010-09-14 NOTE — Assessment & Plan Note (Addendum)
Hemoglobin down 2 gms over last few weeks (now at 7.4). No overt bleeding, heme negative. Patient is more SOB than baseline. Will check iron studies, B12, folate. She will go to [facility] stay today for 2 units of PRBC with lasix in between units. Source of anemia not clear at this point. Patient will follow up with Dr. Juanda Chance in few weeks. I have asked her to come in Friday for CBC to make sure hemoglobin is stable post-transfusion.

## 2010-09-15 ENCOUNTER — Other Ambulatory Visit: Payer: Medicare Other

## 2010-09-15 ENCOUNTER — Other Ambulatory Visit (INDEPENDENT_AMBULATORY_CARE_PROVIDER_SITE_OTHER): Payer: Medicare Other

## 2010-09-15 ENCOUNTER — Telehealth: Payer: Self-pay | Admitting: Internal Medicine

## 2010-09-15 DIAGNOSIS — D649 Anemia, unspecified: Secondary | ICD-10-CM

## 2010-09-15 LAB — CBC WITH DIFFERENTIAL/PLATELET
Basophils Absolute: 0 10*3/uL (ref 0.0–0.1)
Basophils Relative: 0.5 % (ref 0.0–3.0)
Eosinophils Absolute: 0.3 10*3/uL (ref 0.0–0.7)
Eosinophils Relative: 3.1 % (ref 0.0–5.0)
HCT: 34 % — ABNORMAL LOW (ref 36.0–46.0)
Hemoglobin: 11.3 g/dL — ABNORMAL LOW (ref 12.0–15.0)
Lymphocytes Relative: 6.2 % — ABNORMAL LOW (ref 12.0–46.0)
Lymphs Abs: 0.6 10*3/uL — ABNORMAL LOW (ref 0.7–4.0)
MCHC: 33.2 g/dL (ref 30.0–36.0)
MCV: 84.7 fl (ref 78.0–100.0)
Monocytes Absolute: 0.8 10*3/uL (ref 0.1–1.0)
Monocytes Relative: 7.9 % (ref 3.0–12.0)
Neutro Abs: 8 10*3/uL — ABNORMAL HIGH (ref 1.4–7.7)
Neutrophils Relative %: 82.3 % — ABNORMAL HIGH (ref 43.0–77.0)
Platelets: 239 10*3/uL (ref 150.0–400.0)
RBC: 4.01 Mil/uL (ref 3.87–5.11)
RDW: 16.6 % — ABNORMAL HIGH (ref 11.5–14.6)
WBC: 9.7 10*3/uL (ref 4.5–10.5)

## 2010-09-15 NOTE — Telephone Encounter (Signed)
Pt with hx of post gastrostomy gastrocutaneous fistula; failed attempt for closure, low grade SBO, post exp. Lap and lysis of adhesions 1 yr ago, GI bleed d/t pradaxa, O2 dependent COPD, Afib and B12 deficiency, biventricular pacemaker. Per Dr Dwain Sarna, too risky for closure of fistula surgery; do bowel rest with central TPN and domperidone therapy. Pt saw Willette Cluster, NP on 09/12/10 for dropping HGB; went to [facility] Short Stay for 2 units of blood with Lasix between units. Source of anemia remains unclear. CBC post transfusion today.  Pt called to report [facility] drew her labs, can't we use those? Explained to pt that CBC wasn't drawn; I called and the saved blood tube cannot be used, she has to come in. Pt stated understanding.

## 2010-09-18 ENCOUNTER — Ambulatory Visit (INDEPENDENT_AMBULATORY_CARE_PROVIDER_SITE_OTHER): Payer: Medicare Other | Admitting: Internal Medicine

## 2010-09-18 DIAGNOSIS — E538 Deficiency of other specified B group vitamins: Secondary | ICD-10-CM

## 2010-09-18 NOTE — Progress Notes (Signed)
Agree with initial assessment and plans 

## 2010-09-18 NOTE — Assessment & Plan Note (Addendum)
Patient had exploratory laparotomy, lysis of adhesions and decompressive gastrostomy tube placement in Feb. 2011. Gastrostomy tube removed March 2011, now with post-gastrostomy gastrocutaneous fistula. Patient is s/p unsuccessful surgical attempt to repair the fistula. Ongoing management per Dr. Juanda Chance.On bowel rest with TNA. Keep follow up appt. with Dr. Juanda Chance.

## 2010-09-21 ENCOUNTER — Other Ambulatory Visit: Payer: Self-pay | Admitting: Family Medicine

## 2010-09-21 ENCOUNTER — Telehealth: Payer: Self-pay | Admitting: *Deleted

## 2010-09-21 ENCOUNTER — Other Ambulatory Visit: Payer: Self-pay | Admitting: Internal Medicine

## 2010-09-21 NOTE — Telephone Encounter (Signed)
Error, did not need this encounter, was only checking a reminder.

## 2010-09-23 ENCOUNTER — Encounter: Payer: Medicare Other | Admitting: Family Medicine

## 2010-09-23 DIAGNOSIS — M79603 Pain in arm, unspecified: Secondary | ICD-10-CM

## 2010-09-23 NOTE — Progress Notes (Signed)
We do not have xray on site, so pt left.  Preferred to go to ortho after hours clinic. Pt not charged.

## 2010-09-27 ENCOUNTER — Encounter: Payer: Self-pay | Admitting: Internal Medicine

## 2010-09-27 ENCOUNTER — Other Ambulatory Visit: Payer: Self-pay | Admitting: Family Medicine

## 2010-09-28 ENCOUNTER — Telehealth: Payer: Self-pay | Admitting: *Deleted

## 2010-09-28 MED ORDER — TEMAZEPAM 15 MG PO CAPS: 15.0000 mg | ORAL_CAPSULE | Freq: Every evening | ORAL | Status: DC | PRN

## 2010-09-28 MED ORDER — AMOXICILLIN 500 MG PO CAPS: ORAL_CAPSULE | ORAL | Status: DC

## 2010-09-28 NOTE — Telephone Encounter (Signed)
Faxed.   KP 

## 2010-09-28 NOTE — Telephone Encounter (Signed)
Last seen 08/15/10 and filled 06/16/10 # 30 please advise    KP

## 2010-09-28 NOTE — Telephone Encounter (Signed)
We got a fax from [facility] that patient's white count is 14.7 (as compared to last weeks wbc of 5.3). I have spoken to patient and she complains of cough only. She denies of fever or any other symptoms. Per Dr Juanda Chance, patient should be given Amoxicillin 500 mg three times daily x 7 days. I have advised patient of this. Patient verbalizes understanding that she should take all 7 days of the medication. She will call us back with any further problems.

## 2010-09-29 ENCOUNTER — Telehealth: Payer: Self-pay | Admitting: Internal Medicine

## 2010-09-29 ENCOUNTER — Encounter: Payer: Self-pay | Admitting: Internal Medicine

## 2010-09-29 NOTE — Telephone Encounter (Signed)
[name] that I sent fax x 2 yesterday. Advised her that everytime I have ever tried to send a fax to the number previously provided, the fax never goes through. She has given me a different number to try. [phone]. Fax has been resent.

## 2010-10-03 ENCOUNTER — Encounter: Payer: Self-pay | Admitting: Internal Medicine

## 2010-10-09 ENCOUNTER — Telehealth: Payer: Self-pay | Admitting: *Deleted

## 2010-10-09 ENCOUNTER — Other Ambulatory Visit: Payer: Self-pay | Admitting: Family Medicine

## 2010-10-09 NOTE — Telephone Encounter (Signed)
Last seen 08/15/10 and fent filled 08/15/10 and Oxy filled 08/29/10 please advise    KP

## 2010-10-09 NOTE — Telephone Encounter (Signed)
Refill - oxycodone 10/325 mg - fentanyl patch - patient will pick up Tuesday [id number]

## 2010-10-10 MED ORDER — FENTANYL 50 MCG/HR TD PT72: 1.0000 | MEDICATED_PATCH | TRANSDERMAL | Status: DC

## 2010-10-10 MED ORDER — OXYCODONE-ACETAMINOPHEN 10-325 MG PO TABS: 1.0000 | ORAL_TABLET | ORAL | Status: DC | PRN

## 2010-10-10 NOTE — Telephone Encounter (Signed)
Patient aware Rx ready for pick up.      KP 

## 2010-10-10 NOTE — Telephone Encounter (Signed)
Refill x1 

## 2010-10-10 NOTE — Telephone Encounter (Signed)
LM for pt regarding prescription from [facility].

## 2010-10-11 ENCOUNTER — Ambulatory Visit: Payer: Medicare Other | Admitting: Pulmonary Disease

## 2010-10-11 ENCOUNTER — Ambulatory Visit: Payer: Medicare Other | Admitting: Internal Medicine

## 2010-10-12 ENCOUNTER — Encounter: Payer: Medicare Other | Admitting: *Deleted

## 2010-10-14 IMAGING — CR DG CHEST 1V PORT
1 series · 1 of 1 positions shown · non-contrast
Comparison: 11/16/2009

CLINICAL DATA: Right port catheter insertion

PORTABLE CHEST - 1 VIEW

[view not recorded]
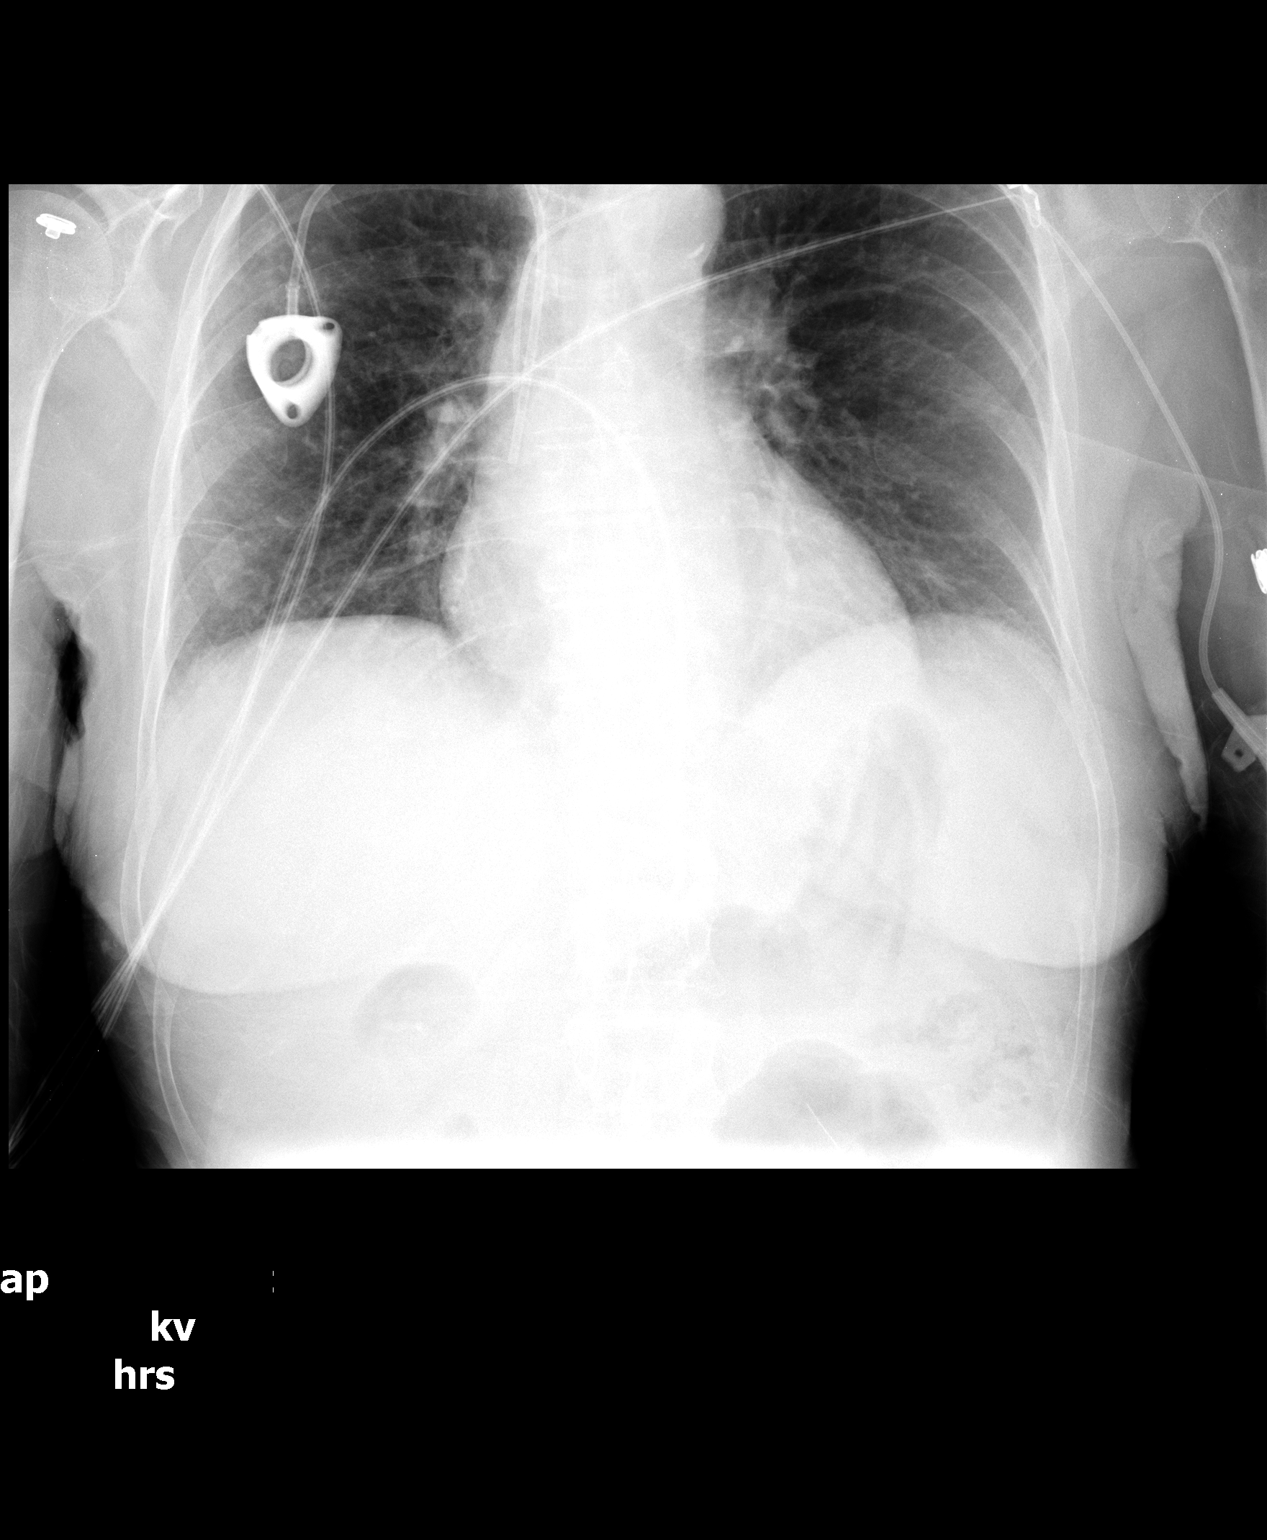

[1 of 1 positions shown; findings below may reference images not displayed]

FINDINGS: New right subclavian port catheter tip in the SVC.  Left
PICC line tip also in the SVC.  Normal heart size and vascularity.
No effusion or pneumothorax.
IMPRESSION: Right subclavian port catheter tip SVC.

## 2010-10-16 ENCOUNTER — Other Ambulatory Visit: Payer: Self-pay | Admitting: Family Medicine

## 2010-10-16 NOTE — Telephone Encounter (Signed)
Last OV- 05/19/10- acute Zofran- 09/21/10- #20 Meloxam- 04/21/10, 5 rfs.

## 2010-10-17 ENCOUNTER — Encounter: Payer: Self-pay | Admitting: *Deleted

## 2010-10-18 ENCOUNTER — Encounter: Payer: Self-pay | Admitting: Internal Medicine

## 2010-10-18 DIAGNOSIS — S42402A Unspecified fracture of lower end of left humerus, initial encounter for closed fracture: Secondary | ICD-10-CM

## 2010-10-18 HISTORY — DX: Unspecified fracture of lower end of left humerus, initial encounter for closed fracture: S42.402A

## 2010-10-19 ENCOUNTER — Ambulatory Visit (INDEPENDENT_AMBULATORY_CARE_PROVIDER_SITE_OTHER)
Admission: RE | Admit: 2010-10-19 | Discharge: 2010-10-19 | Disposition: A | Discharge status: Home / Self Care | Payer: Medicare Other | Source: Ambulatory Visit | Attending: Internal Medicine | Admitting: Internal Medicine

## 2010-10-19 ENCOUNTER — Telehealth: Payer: Self-pay | Admitting: *Deleted

## 2010-10-19 ENCOUNTER — Ambulatory Visit (INDEPENDENT_AMBULATORY_CARE_PROVIDER_SITE_OTHER): Payer: Medicare Other | Admitting: Internal Medicine

## 2010-10-19 ENCOUNTER — Telehealth: Payer: Self-pay | Admitting: Internal Medicine

## 2010-10-19 DIAGNOSIS — R1110 Vomiting, unspecified: Secondary | ICD-10-CM

## 2010-10-19 DIAGNOSIS — R197 Diarrhea, unspecified: Secondary | ICD-10-CM

## 2010-10-19 DIAGNOSIS — E538 Deficiency of other specified B group vitamins: Secondary | ICD-10-CM

## 2010-10-19 DIAGNOSIS — D649 Anemia, unspecified: Secondary | ICD-10-CM

## 2010-10-19 MED ORDER — SUCRALFATE 1 GM/10ML PO SUSP: ORAL | Status: DC

## 2010-10-19 MED ORDER — PANTOPRAZOLE SODIUM 40 MG PO TBEC: DELAYED_RELEASE_TABLET | ORAL | Status: DC

## 2010-10-19 NOTE — Telephone Encounter (Signed)
Rx sent to pharmacy   

## 2010-10-19 NOTE — Telephone Encounter (Signed)
Per Dr. Juanda Chance, KUB to r/o SBO, Increase Protonix to BID, Carafate slurry QID x 5 days 12 oz.  May need extra liter of IVF if labs are abnormal. Patient notified and will come for KUB. Patient is allergic to Talwin and this is contraindicated with Carafate slurry- Please, advise.

## 2010-10-19 NOTE — Telephone Encounter (Signed)
Please override Talwin allergy. She had carafate before.

## 2010-10-19 NOTE — Telephone Encounter (Signed)
Called and spoke with pharmacist at [facility] and the labs on patient are not back yet. He will fax them when he gets them.

## 2010-10-19 NOTE — Telephone Encounter (Signed)
Patient calling to report she has had vomiting and diarrhea for one week. States she is vomiting 2-3 times/day and water burns when it goes down. States she is having diarrhea 4-5 times/day and it is watery. She is taking Protonix daily and Zofran. Zofran does help some. Temperature was 99.5 yesterday. Labs drawn by her nurse yesterday(Free albumin results back this AM and are normal). Please advise.

## 2010-10-20 ENCOUNTER — Inpatient Hospital Stay (HOSPITAL_COMMUNITY)
Admission: AD | Admit: 2010-10-20 | Discharge: 2010-10-26 | DRG: 389 | Disposition: A | Discharge status: Home Health | Payer: Medicare Other | Source: Ambulatory Visit | Attending: Internal Medicine | Admitting: Internal Medicine

## 2010-10-20 ENCOUNTER — Telehealth: Payer: Self-pay | Admitting: *Deleted

## 2010-10-20 ENCOUNTER — Encounter: Payer: Self-pay | Admitting: Internal Medicine

## 2010-10-20 ENCOUNTER — Ambulatory Visit (HOSPITAL_COMMUNITY): Payer: Medicare Other | Admitting: Internal Medicine

## 2010-10-20 VITALS — BP 122/60 | HR 64 | Ht 62.0 in | Wt 99.0 lb

## 2010-10-20 DIAGNOSIS — G894 Chronic pain syndrome: Secondary | ICD-10-CM | POA: Diagnosis present

## 2010-10-20 DIAGNOSIS — I447 Left bundle-branch block, unspecified: Secondary | ICD-10-CM | POA: Diagnosis present

## 2010-10-20 DIAGNOSIS — Z950 Presence of cardiac pacemaker: Secondary | ICD-10-CM

## 2010-10-20 DIAGNOSIS — D509 Iron deficiency anemia, unspecified: Secondary | ICD-10-CM

## 2010-10-20 DIAGNOSIS — IMO0001 Reserved for inherently not codable concepts without codable children: Secondary | ICD-10-CM | POA: Diagnosis present

## 2010-10-20 DIAGNOSIS — A09 Infectious gastroenteritis and colitis, unspecified: Secondary | ICD-10-CM

## 2010-10-20 DIAGNOSIS — J449 Chronic obstructive pulmonary disease, unspecified: Secondary | ICD-10-CM | POA: Diagnosis present

## 2010-10-20 DIAGNOSIS — I428 Other cardiomyopathies: Secondary | ICD-10-CM | POA: Diagnosis present

## 2010-10-20 DIAGNOSIS — J4489 Other specified chronic obstructive pulmonary disease: Secondary | ICD-10-CM | POA: Diagnosis present

## 2010-10-20 DIAGNOSIS — D638 Anemia in other chronic diseases classified elsewhere: Secondary | ICD-10-CM | POA: Diagnosis present

## 2010-10-20 DIAGNOSIS — T8183XA Persistent postprocedural fistula, initial encounter: Secondary | ICD-10-CM | POA: Diagnosis present

## 2010-10-20 DIAGNOSIS — Y849 Medical procedure, unspecified as the cause of abnormal reaction of the patient, or of later complication, without mention of misadventure at the time of the procedure: Secondary | ICD-10-CM | POA: Diagnosis present

## 2010-10-20 DIAGNOSIS — Z681 Body mass index (BMI) 19 or less, adult: Secondary | ICD-10-CM

## 2010-10-20 DIAGNOSIS — I4891 Unspecified atrial fibrillation: Secondary | ICD-10-CM | POA: Diagnosis present

## 2010-10-20 DIAGNOSIS — R195 Other fecal abnormalities: Secondary | ICD-10-CM

## 2010-10-20 DIAGNOSIS — E46 Unspecified protein-calorie malnutrition: Secondary | ICD-10-CM | POA: Diagnosis present

## 2010-10-20 DIAGNOSIS — K56609 Unspecified intestinal obstruction, unspecified as to partial versus complete obstruction: Principal | ICD-10-CM | POA: Diagnosis present

## 2010-10-20 DIAGNOSIS — K3184 Gastroparesis: Secondary | ICD-10-CM | POA: Diagnosis present

## 2010-10-20 DIAGNOSIS — R112 Nausea with vomiting, unspecified: Secondary | ICD-10-CM | POA: Diagnosis present

## 2010-10-20 DIAGNOSIS — I10 Essential (primary) hypertension: Secondary | ICD-10-CM | POA: Diagnosis present

## 2010-10-20 DIAGNOSIS — IMO0002 Reserved for concepts with insufficient information to code with codable children: Secondary | ICD-10-CM | POA: Diagnosis present

## 2010-10-20 LAB — COMPREHENSIVE METABOLIC PANEL
ALT: 28 U/L (ref 0–35)
AST: 28 U/L (ref 0–37)
Albumin: 2.4 g/dL — ABNORMAL LOW (ref 3.5–5.2)
Alkaline Phosphatase: 157 U/L — ABNORMAL HIGH (ref 39–117)
BUN: 50 mg/dL — ABNORMAL HIGH (ref 6–23)
CO2: 28 mEq/L (ref 19–32)
Calcium: 7.8 mg/dL — ABNORMAL LOW (ref 8.4–10.5)
Chloride: 105 mEq/L (ref 96–112)
Creatinine, Ser: 0.92 mg/dL (ref 0.50–1.10)
GFR calc Af Amer: >60 mL/min (ref >60)
GFR calc non Af Amer: >60 mL/min (ref >60)
Glucose, Bld: 86 mg/dL (ref 70–99)
Potassium: 5.1 mEq/L (ref 3.5–5.1)
Sodium: 136 mEq/L (ref 135–145)
Total Bilirubin: 0.2 mg/dL — ABNORMAL LOW (ref 0.3–1.2)
Total Protein: 5.7 g/dL — ABNORMAL LOW (ref 6.0–8.3)

## 2010-10-20 LAB — CBC
HCT: 29.8 % — ABNORMAL LOW (ref 36.0–46.0)
Hemoglobin: 9.5 g/dL — ABNORMAL LOW (ref 12.0–15.0)
MCH: 26.7 pg (ref 26.0–34.0)
MCHC: 31.9 g/dL (ref 30.0–36.0)
MCV: 83.7 fL (ref 78.0–100.0)
Platelets: 350 10*3/uL (ref 150–400)
RBC: 3.56 MIL/uL — ABNORMAL LOW (ref 3.87–5.11)
RDW: 16.7 % — ABNORMAL HIGH (ref 11.5–15.5)
WBC: 11.4 10*3/uL — ABNORMAL HIGH (ref 4.0–10.5)

## 2010-10-20 IMAGING — CR DG CHEST 1V PORT
1 series · 1 of 1 positions shown · non-contrast
Comparison: 12/16/2009

CLINICAL DATA: Post pacemaker placement

PORTABLE CHEST - 1 VIEW

[view not recorded]
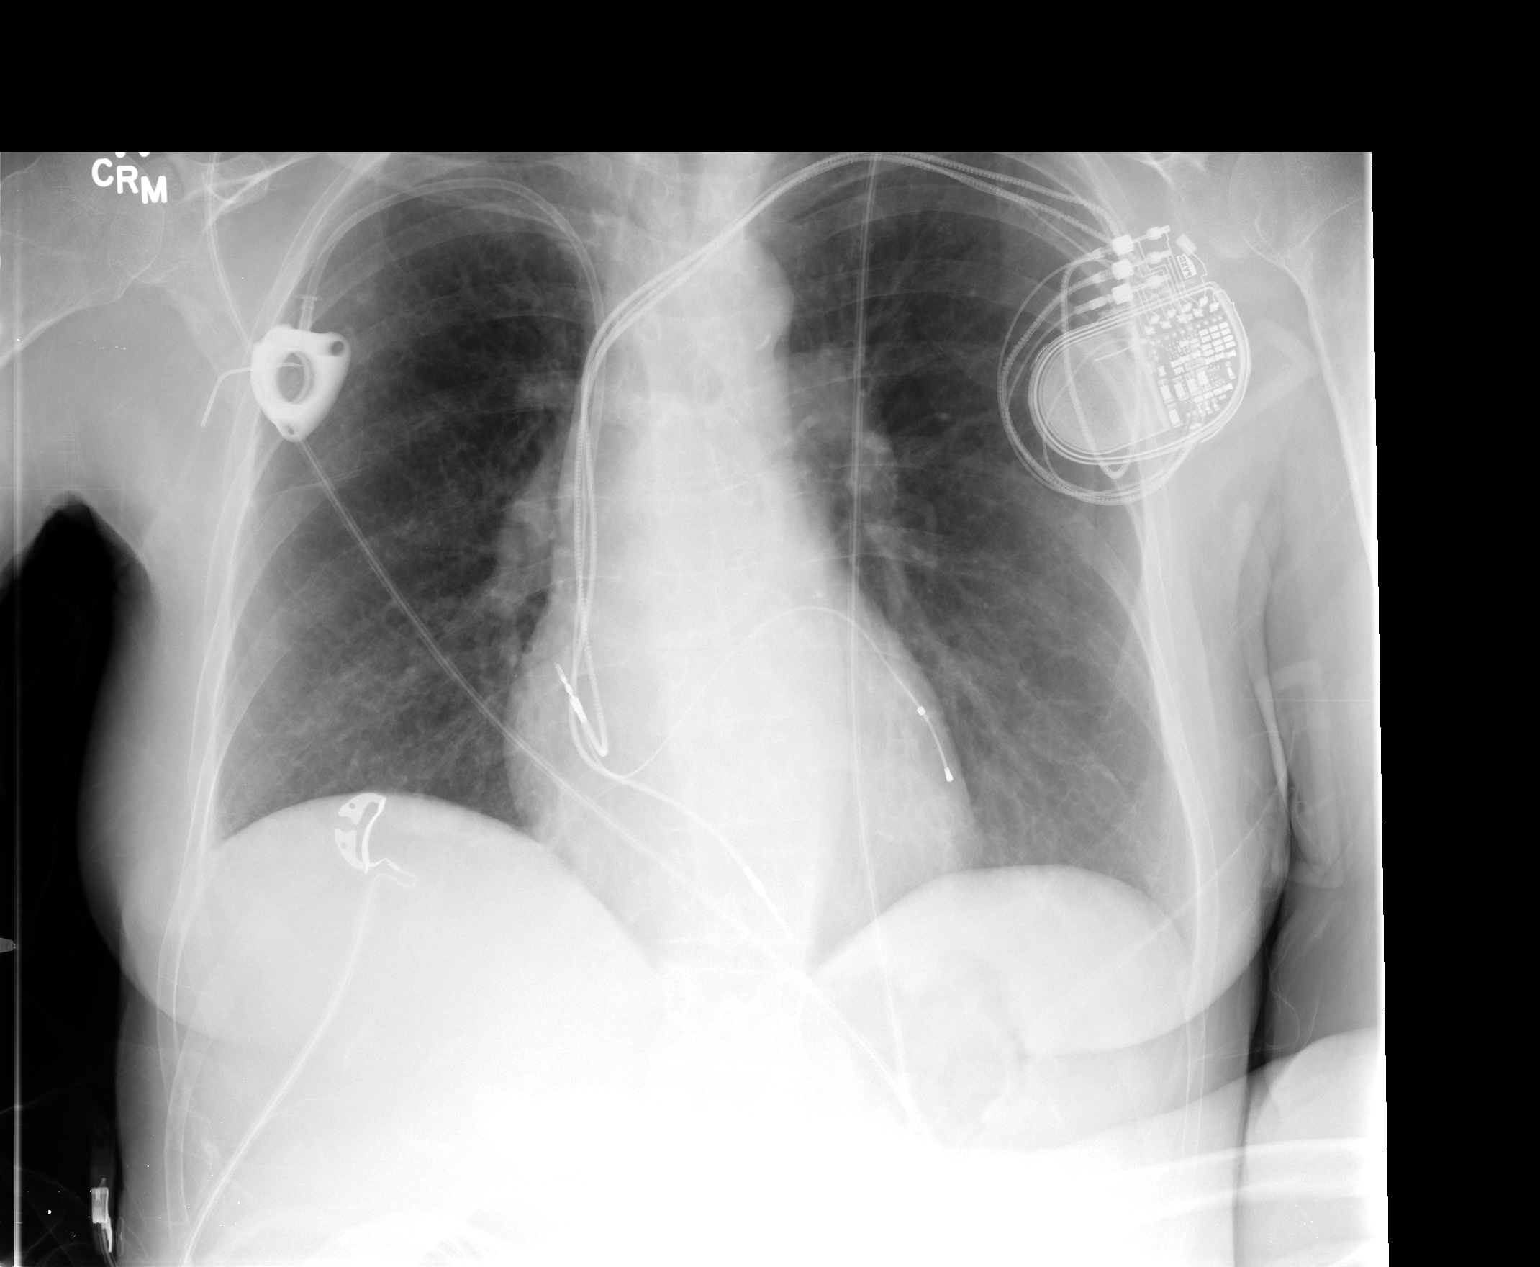

[1 of 1 positions shown; findings below may reference images not displayed]

FINDINGS: A multiple lead permanent cardiac pacer has been placed
from the left subclavian venous approach.  No pneumothorax or other
complications evident in one-view. Heart and lungs normal. Port-A-
Cath unchanged.
IMPRESSION: Post placement of permanent cardiac pacemaker with no immediate
complications or active disease.

## 2010-10-20 NOTE — Telephone Encounter (Signed)
Message copied by Daphine Deutscher on Fri Oct 20, 2010  9:41 AM ------      Message from: Hart Carwin      Created: Thu Oct 19, 2010 10:47 PM       Please call [name] with nonspecific findings, no definite obstruction, increased fluid in the ismall bowl. Stay on liquids x 24 hrs.

## 2010-10-20 NOTE — Telephone Encounter (Signed)
Patient notified of results. She is not doing any better this AM. States she is still having a lot of nausea. She did get Carafate and had 2 doses yesterday. Continues to have diarrhea at least 5-6 times yesterday. She report she was up during the night and has it this AM.

## 2010-10-20 NOTE — Patient Instructions (Signed)
You are being admitted to [facility], [address] [place]. Please go to admissions.

## 2010-10-20 NOTE — Progress Notes (Signed)
[name] 1945/07/30 MRN [id number]   History of Present Illness:  This is a 65 year old white female with chronic low-grade small bowel obstruction due to adhesive disease. She has had recurrent obstruction, diarrhea, nausea and vomiting of one week duration. She has a gastrocutaneous fistula from a percutaneous gastrostomy which did not heal after PEG  removal several months ago. She has been on bowel rest at home, having central TNA administered at night; she has a history of an upper GI bleed in October 2011 secondary to esophageal ulcers in the setting of Pradaxa. She underwent an exploratory laparotomy with lysis of adhesions and gastrostomy tube placement in December 2011. She has chronic GI blood loss and iron deficiency anemia.    Past Medical History  Diagnosis Date  . Small bowel obstruction   . Anemia   . HTN (hypertension)   . Osteoarthritis   . GERD (gastroesophageal reflux disease)   . CAD (coronary artery disease)   . LBBB (left bundle branch block)   . Atrial fibrillation     Dr. Roger Shelter  . 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 Fibromyalgia   . Diverticulosis   . COPD (chronic obstructive pulmonary disease)     -06/28/08 PFT FEV1 1.95 (97%), FVC 2.79 (101%), FEV1% 70, TLC 3.88 (87%), DLCO 64%  . Pneumonia - 03/09, 12/09, 02/10, 06/10  . Pleural effusion      s/p right thoracentesis 03/09  . Small bowel obstruction   . DDD (degenerative disc disease)   . Urinary incontinence   . RLS (restless legs syndrome)   . Primary dilated cardiomyopathy   . Vitamin B12 deficiency   . Gastroparesis   . Chronic nausea   . Chronic abdominal pain   . Chronic pain syndrome   . Gastrocutaneous fistula    Past Surgical History  Procedure Date  . Colectomy     Intestinal resection (5times),S/P PARTIAL COLECTOMY 2001  . Appendectomy   . Bladder surgery   . Tonsillectomy   . Picc line   . Total abdominal hysterectomy   .  Pacemaker placement     PPM-Medtronic  . Peg placement   . Abdominal adhesion surgery     numerous surgeries  . Peg removed     with complications    reports that she quit smoking about 24 years ago. Her smoking use included Cigarettes. She has a 20 pack-year smoking history. She has never used smokeless tobacco. She reports that she does not drink alcohol or use illicit drugs. family history includes Heart disease in her father and Stroke in her mother.  There is no history of Colon cancer. Allergies  Allergen Reactions  . Pentazocine Lactate   . Pradaxa   . Sulfonamide Derivatives   . Talwin         Review of Systems positive for nausea vomiting negative for hematemesis. Positive for diarrhea but no gross G. a:  The remainder of the 10  point ROS is negative except as outlined in H&P   Physical Exam: General appearance chronically ill appearing, thin female with nasal oxygen. Eyes- non icteric. HEENT nontraumatic, normocephalic. Mouth no lesions, tongue papillated, no cheilosis. Neck supple without adenopathy, thyroid not enlarged, no carotid bruits, no JVD. Lungs Clear to auscultation bilaterally. Cor normal S1 normal S2, regular rhythm , no murmur,  quiet precordium, tachycardia. Abdomen scaphoid abdomen with scar in left upper quadrant with minimal drainage. Hyperactive bowel sounds with increased tympany  in all quadrants. No rebound, well-healed surgical scars. Rectal: Soft Hemoccult-positive stool. Extremities no pedal edema. Skin no lesions. Neurological alert and oriented x 3. Psychological normal mood and affect.  Assessment and Plan:  Problem #1 Chronic low-grade small bowel obstruction with superimposed acute illness consisting of diarrhea, nausea and vomiting. KUB yesterday showed fluid levels but no definite obstruction. We need to rule out infectious diarrhea. He will obtain stool cultures and C. difficile toxin. She will be hydrated through a Port-A-Cath and  we will continue her central TNA 24-hours. We may ask for surgical consultation from Dr. Davina Poke. She will be put back on her home medications. Tentatively, she may need an upper endoscopy to evaluate Hemoccult-positive stool. She is up-to-date on her colonoscopy. She may need an iron infusion while in the hospital. Problem#2 anemia, heme positive stool, off Pradaxa, up to date on colonoscopy, m consider EGD,   Problem#3 failure to thrive ,weight loss, on bowl rest with cTNA managed by home care, weight down 6 lbs. Will continue cTNA i the hospital,   10/20/2010 [name]

## 2010-10-20 NOTE — Telephone Encounter (Signed)
Per Dr. Juanda Chance, add patient to schedule for today, Scheduled at 1:15 PM with 1:00 arrival. Patient aware.

## 2010-10-21 DIAGNOSIS — D62 Acute posthemorrhagic anemia: Secondary | ICD-10-CM

## 2010-10-21 DIAGNOSIS — K56609 Unspecified intestinal obstruction, unspecified as to partial versus complete obstruction: Secondary | ICD-10-CM

## 2010-10-21 DIAGNOSIS — E440 Moderate protein-calorie malnutrition: Secondary | ICD-10-CM

## 2010-10-21 DIAGNOSIS — K316 Fistula of stomach and duodenum: Secondary | ICD-10-CM

## 2010-10-21 LAB — COMPREHENSIVE METABOLIC PANEL
ALT: 55 U/L — ABNORMAL HIGH (ref 0–35)
AST: 90 U/L — ABNORMAL HIGH (ref 0–37)
Albumin: 2 g/dL — ABNORMAL LOW (ref 3.5–5.2)
Alkaline Phosphatase: 222 U/L — ABNORMAL HIGH (ref 39–117)
BUN: 36 mg/dL — ABNORMAL HIGH (ref 6–23)
CO2: 26 mEq/L (ref 19–32)
Calcium: 8 mg/dL — ABNORMAL LOW (ref 8.4–10.5)
Chloride: 107 mEq/L (ref 96–112)
Creatinine, Ser: 0.85 mg/dL (ref 0.50–1.10)
GFR calc Af Amer: >60 mL/min (ref >60)
GFR calc non Af Amer: >60 mL/min (ref >60)
Glucose, Bld: 99 mg/dL (ref 70–99)
Potassium: 4.6 mEq/L (ref 3.5–5.1)
Sodium: 138 mEq/L (ref 135–145)
Total Bilirubin: 0.3 mg/dL (ref 0.3–1.2)
Total Protein: 5 g/dL — ABNORMAL LOW (ref 6.0–8.3)

## 2010-10-21 LAB — IRON AND TIBC
Iron: 19 ug/dL — ABNORMAL LOW (ref 42–135)
Saturation Ratios: 11 % — ABNORMAL LOW (ref 20–55)
TIBC: 169 ug/dL — ABNORMAL LOW (ref 250–470)
UIBC: 150 ug/dL

## 2010-10-21 LAB — PHOSPHORUS: Phosphorus: 5 mg/dL — ABNORMAL HIGH (ref 2.3–4.6)

## 2010-10-21 LAB — CLOSTRIDIUM DIFFICILE BY PCR: Toxigenic C. Difficile by PCR: NEGATIVE

## 2010-10-21 LAB — MAGNESIUM: Magnesium: 2.1 mg/dL (ref 1.5–2.5)

## 2010-10-21 LAB — VITAMIN B12: Vitamin B-12: >2000 pg/mL — ABNORMAL HIGH (ref 211–911)

## 2010-10-22 ENCOUNTER — Inpatient Hospital Stay (HOSPITAL_COMMUNITY): Payer: Medicare Other

## 2010-10-22 DIAGNOSIS — D638 Anemia in other chronic diseases classified elsewhere: Secondary | ICD-10-CM

## 2010-10-22 DIAGNOSIS — N159 Renal tubulo-interstitial disease, unspecified: Secondary | ICD-10-CM

## 2010-10-22 DIAGNOSIS — K316 Fistula of stomach and duodenum: Secondary | ICD-10-CM

## 2010-10-22 LAB — COMPREHENSIVE METABOLIC PANEL
ALT: 39 U/L — ABNORMAL HIGH (ref 0–35)
AST: 38 U/L — ABNORMAL HIGH (ref 0–37)
Albumin: 2.1 g/dL — ABNORMAL LOW (ref 3.5–5.2)
Alkaline Phosphatase: 215 U/L — ABNORMAL HIGH (ref 39–117)
BUN: 14 mg/dL (ref 6–23)
CO2: 29 mEq/L (ref 19–32)
Calcium: 8.1 mg/dL — ABNORMAL LOW (ref 8.4–10.5)
Chloride: 104 mEq/L (ref 96–112)
Creatinine, Ser: 0.6 mg/dL (ref 0.50–1.10)
GFR calc Af Amer: >60 mL/min (ref >60)
GFR calc non Af Amer: >60 mL/min (ref >60)
Glucose, Bld: 109 mg/dL — ABNORMAL HIGH (ref 70–99)
Potassium: 3.7 mEq/L (ref 3.5–5.1)
Sodium: 138 mEq/L (ref 135–145)
Total Bilirubin: 0.1 mg/dL — ABNORMAL LOW (ref 0.3–1.2)
Total Protein: 5.2 g/dL — ABNORMAL LOW (ref 6.0–8.3)

## 2010-10-22 LAB — DIFFERENTIAL
Basophils Absolute: 0 10*3/uL (ref 0.0–0.1)
Basophils Relative: 0 % (ref 0–1)
Eosinophils Absolute: 0.2 10*3/uL (ref 0.0–0.7)
Eosinophils Relative: 4 % (ref 0–5)
Lymphocytes Relative: 10 % — ABNORMAL LOW (ref 12–46)
Lymphs Abs: 0.5 10*3/uL — ABNORMAL LOW (ref 0.7–4.0)
Monocytes Absolute: 0.6 10*3/uL (ref 0.1–1.0)
Monocytes Relative: 11 % (ref 3–12)
Neutro Abs: 4.2 10*3/uL (ref 1.7–7.7)
Neutrophils Relative %: 76 % (ref 43–77)

## 2010-10-22 LAB — CBC
HCT: 27.1 % — ABNORMAL LOW (ref 36.0–46.0)
Hemoglobin: 8.4 g/dL — ABNORMAL LOW (ref 12.0–15.0)
MCH: 26.3 pg (ref 26.0–34.0)
MCHC: 31 g/dL (ref 30.0–36.0)
MCV: 85 fL (ref 78.0–100.0)
Platelets: 299 10*3/uL (ref 150–400)
RBC: 3.19 MIL/uL — ABNORMAL LOW (ref 3.87–5.11)
RDW: 16.3 % — ABNORMAL HIGH (ref 11.5–15.5)
WBC: 5.6 10*3/uL (ref 4.0–10.5)

## 2010-10-22 LAB — GLUCOSE, CAPILLARY
Glucose-Capillary: 127 mg/dL — ABNORMAL HIGH (ref 70–99)
Glucose-Capillary: 131 mg/dL — ABNORMAL HIGH (ref 70–99)
Glucose-Capillary: 129 mg/dL — ABNORMAL HIGH (ref 70–99)

## 2010-10-22 LAB — PREALBUMIN: Prealbumin: 13 mg/dL — ABNORMAL LOW (ref 17.0–34.0)

## 2010-10-22 LAB — MAGNESIUM: Magnesium: 2.1 mg/dL (ref 1.5–2.5)

## 2010-10-22 LAB — TRIGLYCERIDES: Triglycerides: 71 mg/dL (ref <150)

## 2010-10-22 LAB — CHOLESTEROL, TOTAL: Cholesterol: 112 mg/dL (ref 0–200)

## 2010-10-22 LAB — PHOSPHORUS: Phosphorus: 4.4 mg/dL (ref 2.3–4.6)

## 2010-10-22 IMAGING — CT CT ANGIO CHEST
2 of 6 series · 19 of 36 positions shown · IV contrast (omniscan)
Comparison: Chest radiograph same date

CLINICAL DATA: Left upper extremity swelling, pacemaker placement
3 days ago.

CT ANGIOGRAPHY CHEST WITH CONTRAST
TECHNIQUE: Multidetector CT imaging of the chest was performed
using the standard protocol during bolus administration of
intravenous contrast.  Multiplanar CT image reconstructions
including MIPs were obtained to evaluate the vascular anatomy.
Contrast:  100 ml Omniscan 300 IV contrast

[Series 8: pulm embolism 1.0 b25f thins · axial · 0.62mm/px · z∈[+676,+912]mm · 18 of 264 slices shown]
[im 14/264  lung]
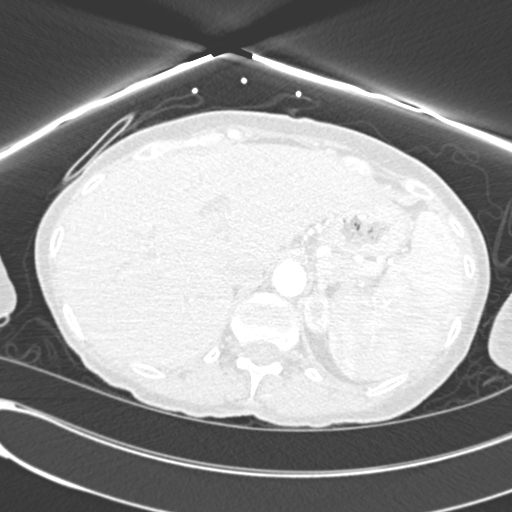
[im 27/264  mediastinal]
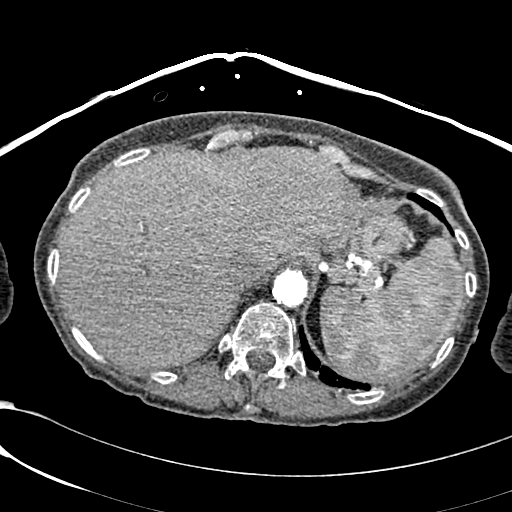
[im 40/264  lung]
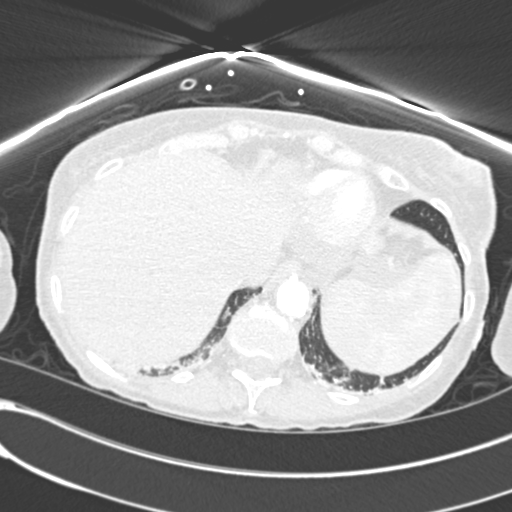
[im 53/264  mediastinal]
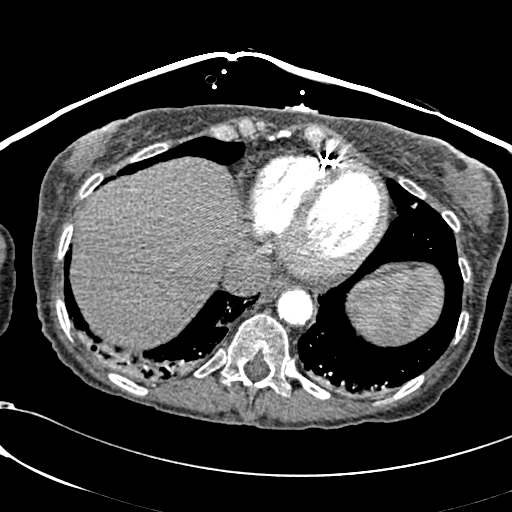
[im 66/264  lung]
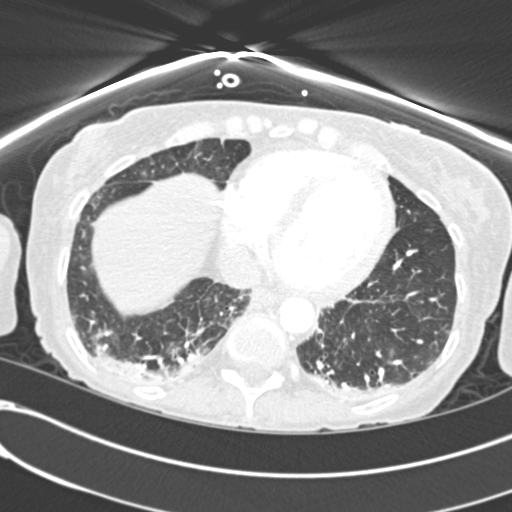
[im 79/264  mediastinal]
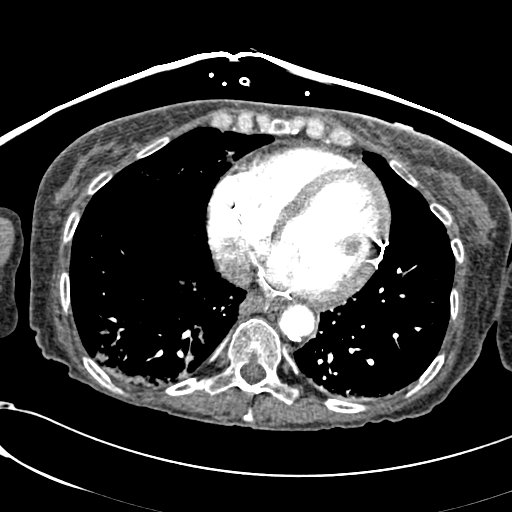
[im 93/264  lung]
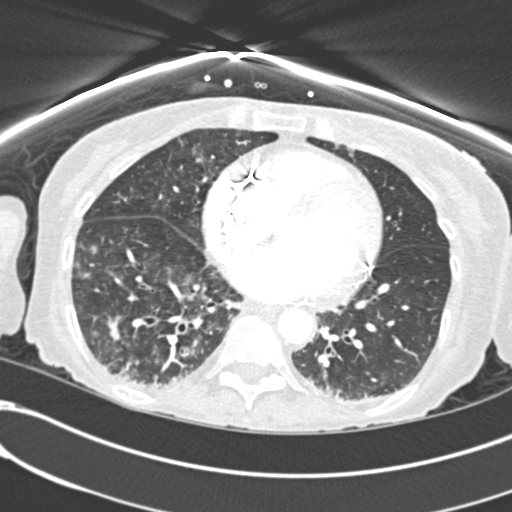
[im 106/264  mediastinal]
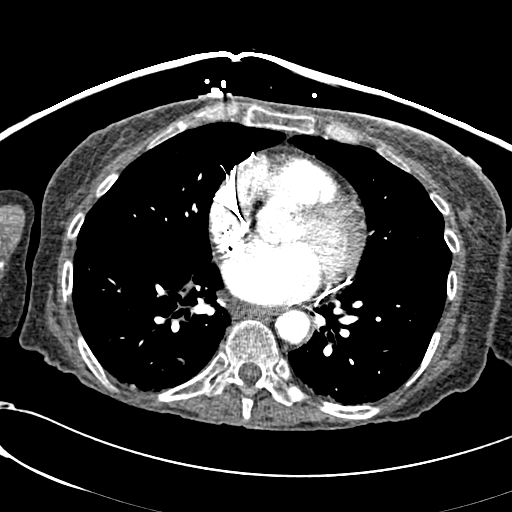
[im 119/264  lung]
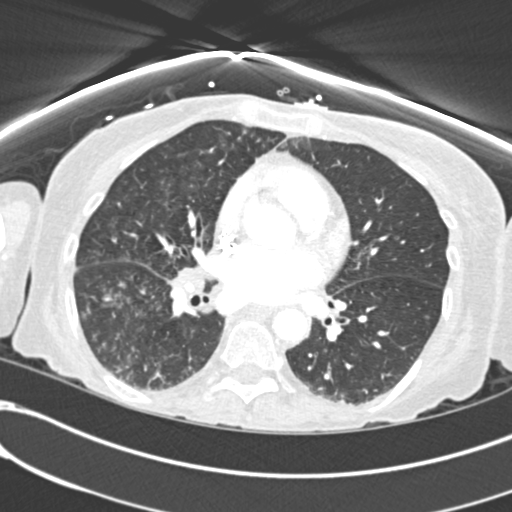
[im 145/264  mediastinal]
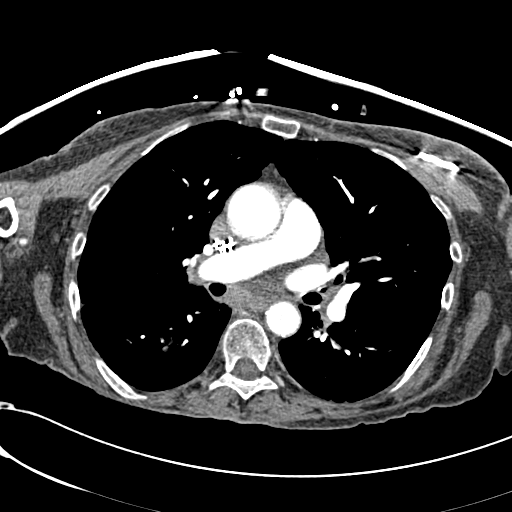
[im 158/264  lung]
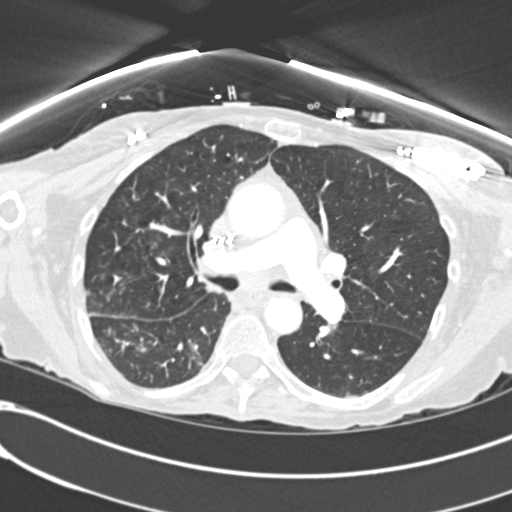
[im 171/264  mediastinal]
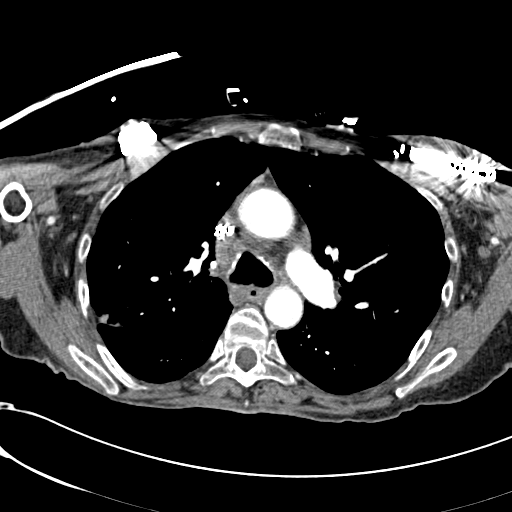
[im 185/264  lung]
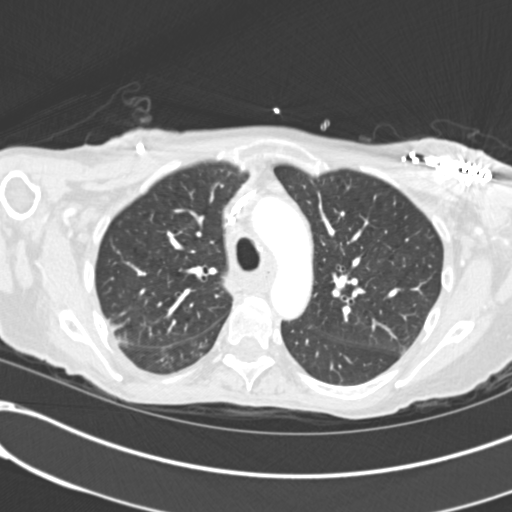
[im 198/264  mediastinal]
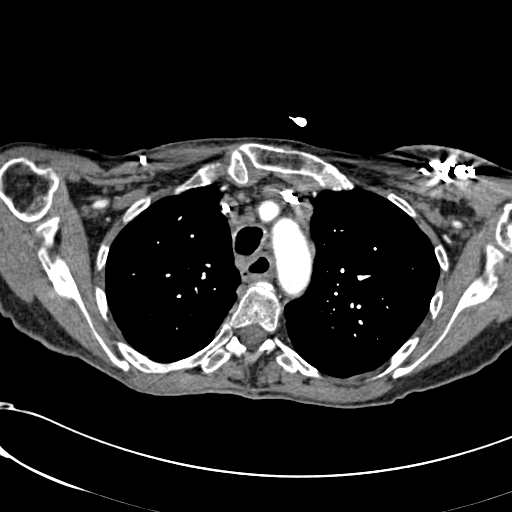
[im 211/264  lung]
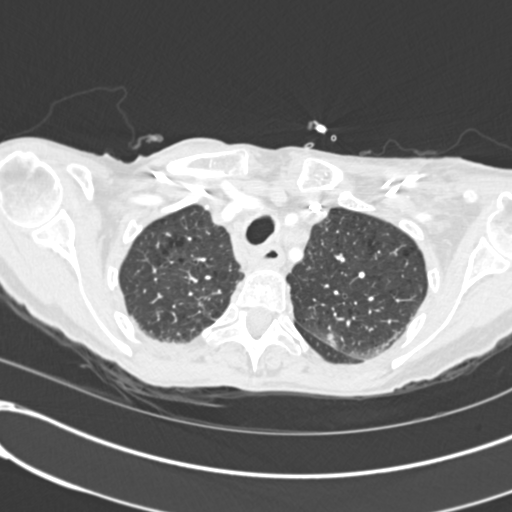
[im 224/264  mediastinal]
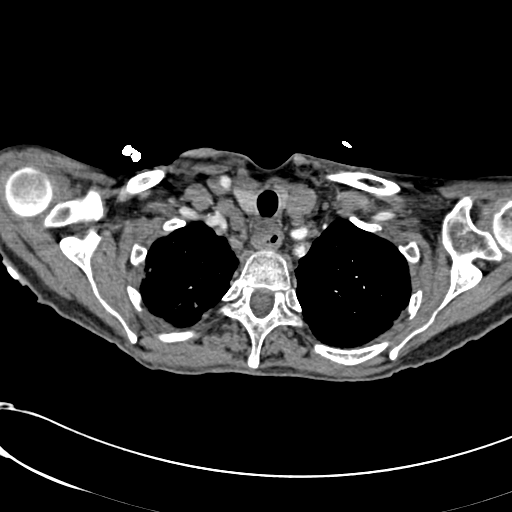
[im 237/264  lung]
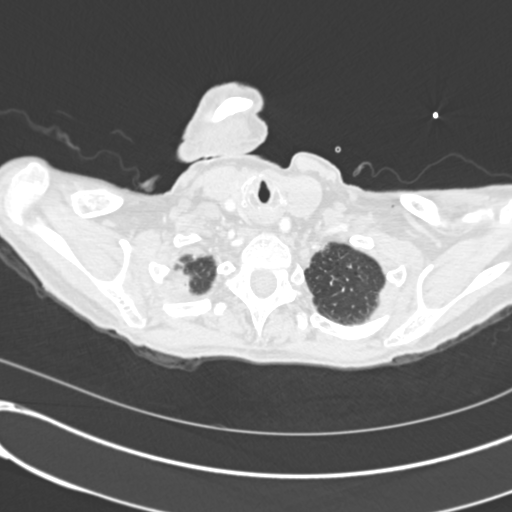
[im 250/264  mediastinal]
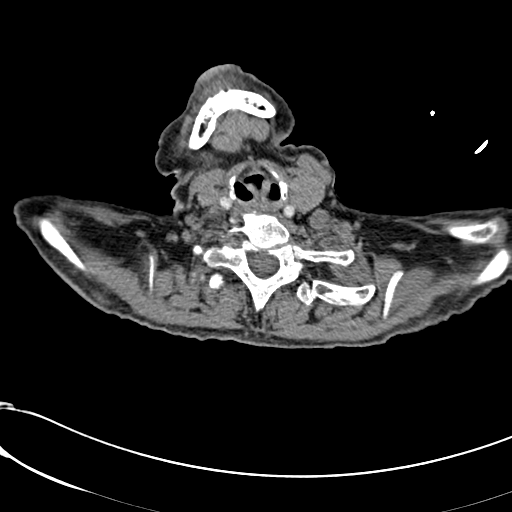

[Series 602: coronal · coronal · 0.62mm/px · 1 of 101 slices shown]
[im 51/101  mediastinal]
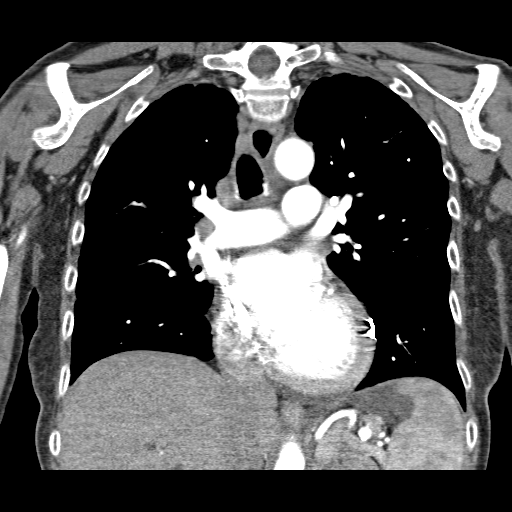

[19 of 36 positions shown; findings below may reference images not displayed]

FINDINGS: Left-sided pacer is noted producing streak artifact
obscuring detail.  Right-sided Port-A-Cath tip terminates in the
distal SVC.  Heart size is normal.  No pericardial effusion.  No
lymphadenopathy.

The study is of adequate technical quality for evaluation for
pulmonary embolism up to and including the 3rd order pulmonary
arteries. No focal filling defect is seen to suggest acute
pulmonary embolism.

Incomplete imaging of the upper abdomen demonstrates prominence of
the central bile mass, incompletely imaged.  Atherosclerotic aortic
calcification noted without aneurysm.  No axillary lymphadenopathy
or fluid collection surrounding the left-sided pacer is noted.

Bibasilar dependent atelectasis is noted.  A few patchy areas of
minimal airspace opacity are noted, likely atelectasis, less likely
focal aspiration/infection.  Central airways are patent.

No acute osseous abnormality.

Review of the MIP images confirms the above findings.
IMPRESSION: Patchy bibasilar dependent atelectasis with other areas of focal
minimal parenchymal opacity which could represent atelectasis or
early infection.

No CT evidence for acute pulmonary embolism.

## 2010-10-22 IMAGING — CR DG CHEST 2V
2 series · 2 of 2 positions shown · non-contrast
Comparison: 12/22/2009

CLINICAL DATA: Pacemaker placement with new onset left upper
extremity swelling

CHEST - 2 VIEW

[w chest pa]
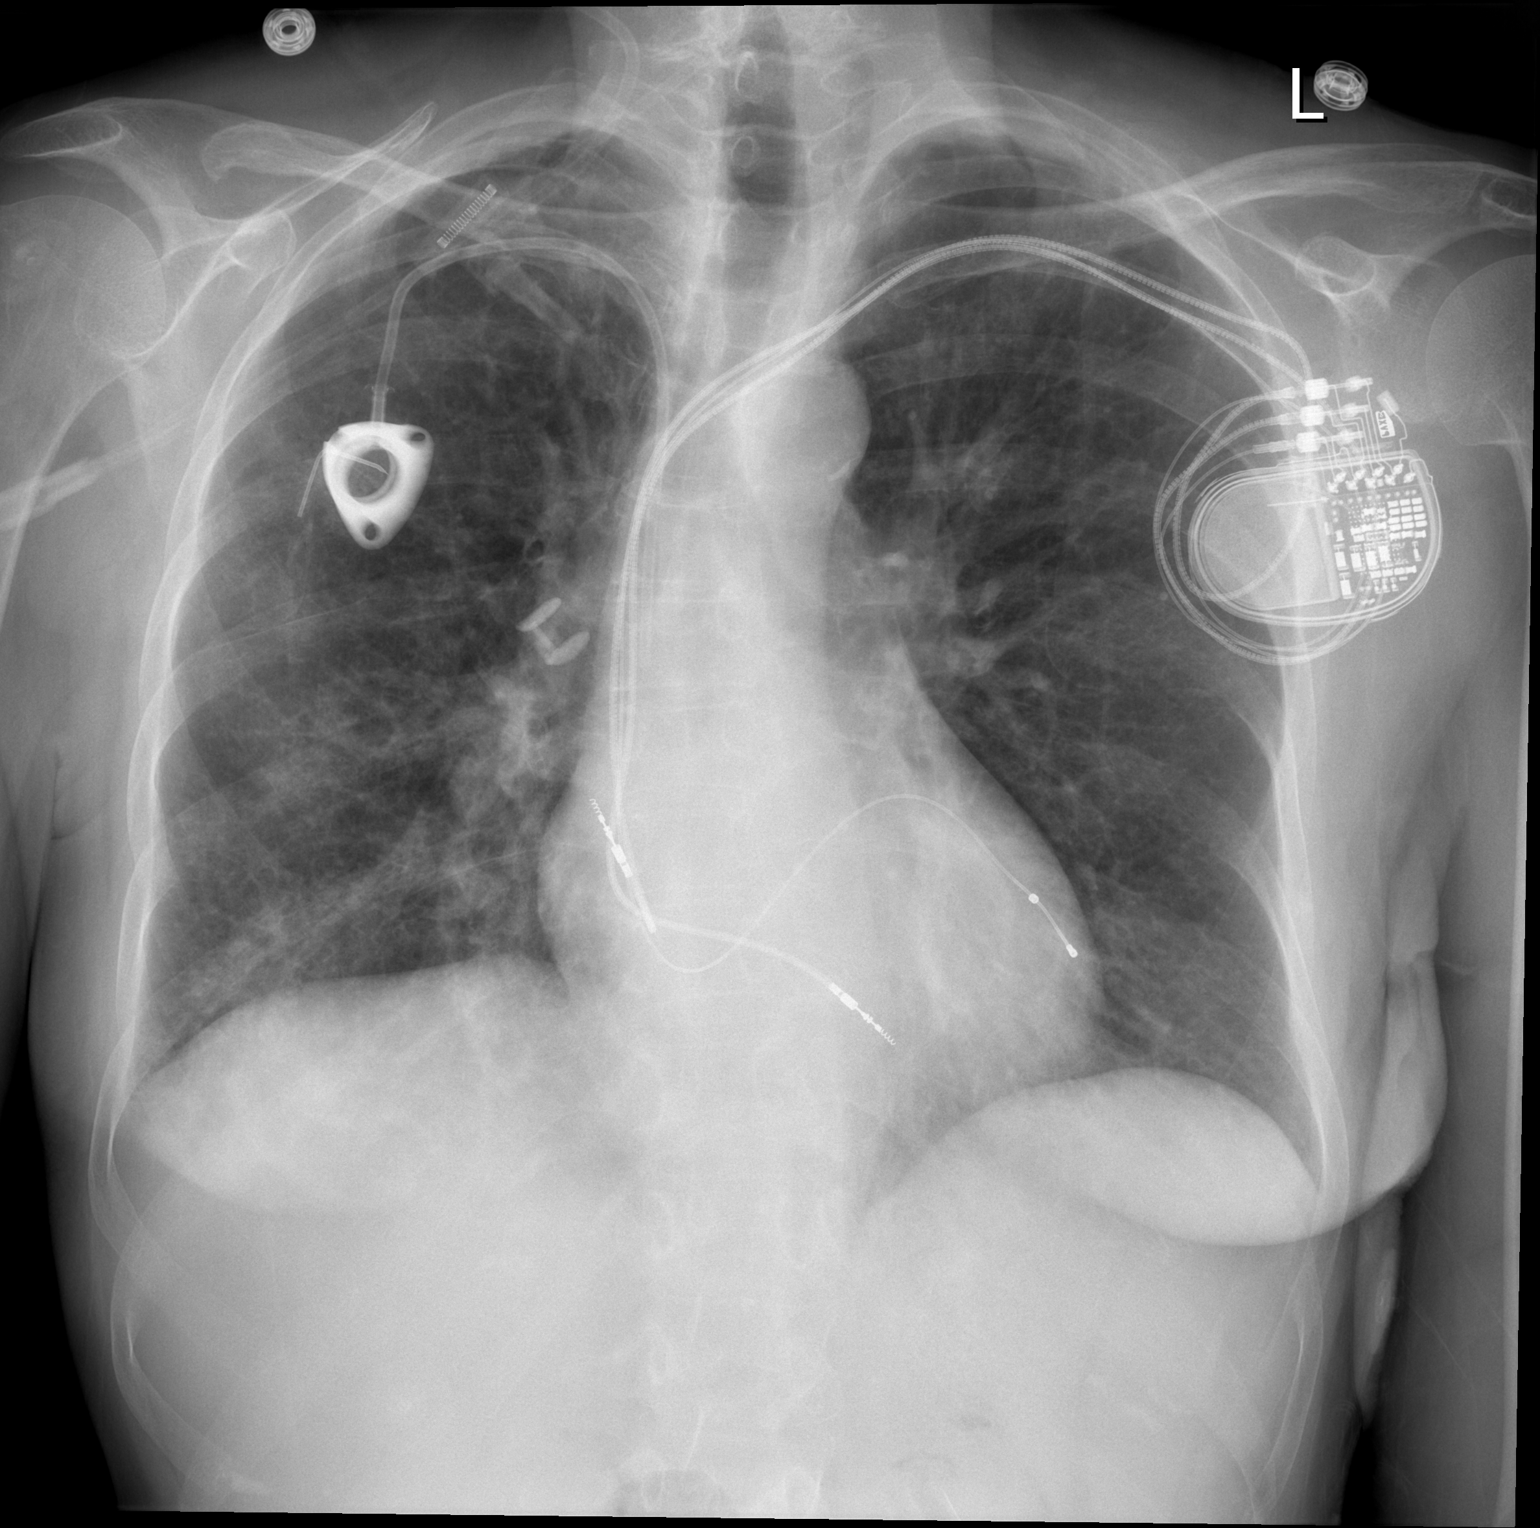

[w chest lat]
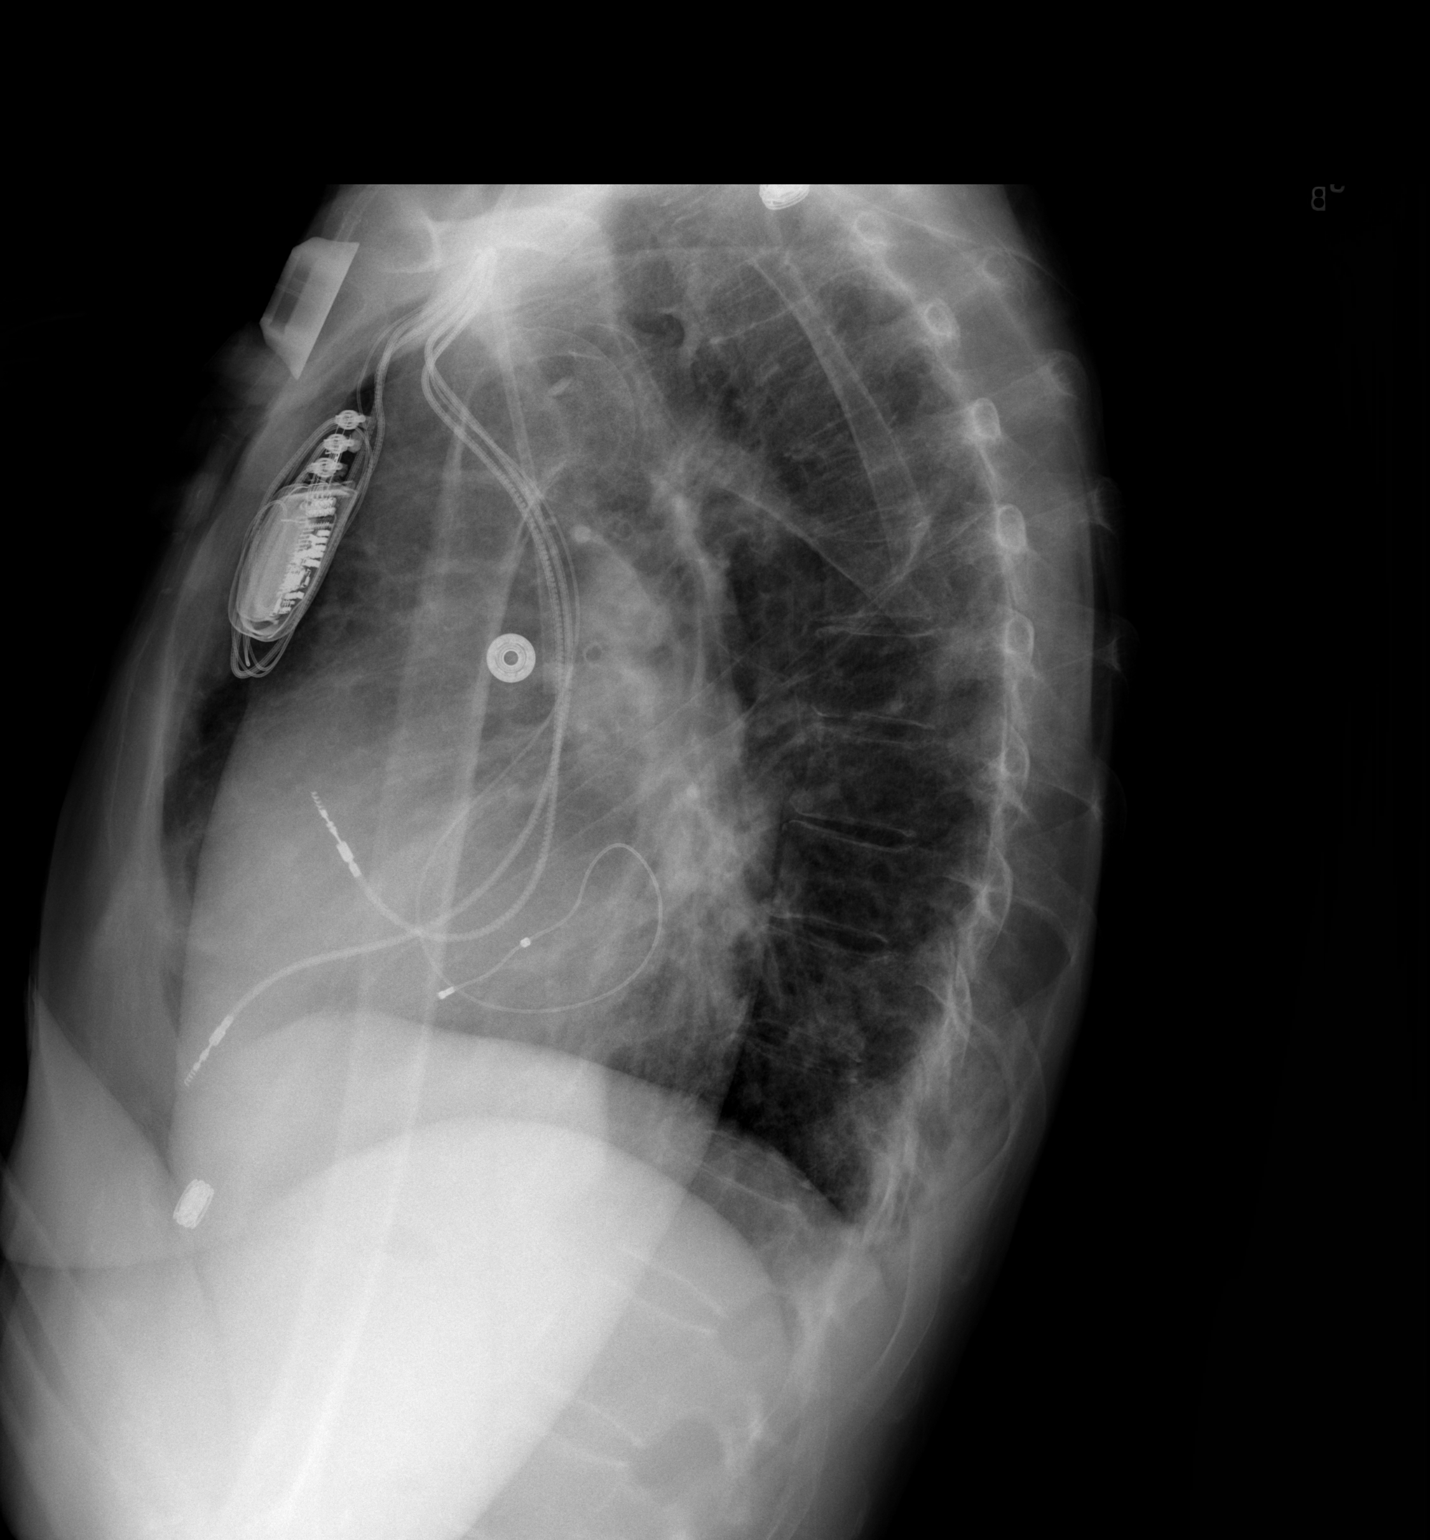

[2 of 2 positions shown; findings below may reference images not displayed]

FINDINGS: Left-sided pacer noted with three visualized leads.
Heart size is normal.  Right-sided Port-A-Cath tip terminates over
the distal SVC.  Heart size is normal.  Diffusely mildly prominent
interstitial markings are noted with a few patchy areas of ill-
defined opacity over the right lower lobe with development of a few
Kerley B lines peripherally.  Biapical pleural thickening again
noted.  No pleural effusion.  No acute osseous finding.
IMPRESSION: Minimal increase in right lower lobe patchy apparent airspace
disease with a few Kerley B lines and trace right costophrenic
angle blunting may indicate effusion with minimal interstitial
edema.

## 2010-10-23 ENCOUNTER — Encounter: Payer: Self-pay | Admitting: Internal Medicine

## 2010-10-23 ENCOUNTER — Inpatient Hospital Stay (HOSPITAL_COMMUNITY): Payer: Medicare Other

## 2010-10-23 DIAGNOSIS — K56609 Unspecified intestinal obstruction, unspecified as to partial versus complete obstruction: Secondary | ICD-10-CM

## 2010-10-23 DIAGNOSIS — D638 Anemia in other chronic diseases classified elsewhere: Secondary | ICD-10-CM

## 2010-10-23 DIAGNOSIS — E440 Moderate protein-calorie malnutrition: Secondary | ICD-10-CM

## 2010-10-23 DIAGNOSIS — K316 Fistula of stomach and duodenum: Secondary | ICD-10-CM

## 2010-10-23 LAB — COMPREHENSIVE METABOLIC PANEL
ALT: 30 U/L (ref 0–35)
AST: 24 U/L (ref 0–37)
Albumin: 2.1 g/dL — ABNORMAL LOW (ref 3.5–5.2)
Alkaline Phosphatase: 207 U/L — ABNORMAL HIGH (ref 39–117)
BUN: 11 mg/dL (ref 6–23)
CO2: 30 mEq/L (ref 19–32)
Calcium: 8.3 mg/dL — ABNORMAL LOW (ref 8.4–10.5)
Chloride: 101 mEq/L (ref 96–112)
Creatinine, Ser: <0.47 mg/dL — ABNORMAL LOW (ref 0.50–1.10)
Glucose, Bld: 126 mg/dL — ABNORMAL HIGH (ref 70–99)
Potassium: 3.9 mEq/L (ref 3.5–5.1)
Sodium: 135 mEq/L (ref 135–145)
Total Bilirubin: 0.1 mg/dL — ABNORMAL LOW (ref 0.3–1.2)
Total Protein: 5.4 g/dL — ABNORMAL LOW (ref 6.0–8.3)

## 2010-10-23 LAB — DIFFERENTIAL
Basophils Absolute: 0 10*3/uL (ref 0.0–0.1)
Basophils Relative: 0 % (ref 0–1)
Eosinophils Absolute: 0.2 10*3/uL (ref 0.0–0.7)
Eosinophils Relative: 2 % (ref 0–5)
Lymphocytes Relative: 5 % — ABNORMAL LOW (ref 12–46)
Lymphs Abs: 0.3 10*3/uL — ABNORMAL LOW (ref 0.7–4.0)
Monocytes Absolute: 0.6 10*3/uL (ref 0.1–1.0)
Monocytes Relative: 9 % (ref 3–12)
Neutro Abs: 6 10*3/uL (ref 1.7–7.7)
Neutrophils Relative %: 84 % — ABNORMAL HIGH (ref 43–77)

## 2010-10-23 LAB — CBC
HCT: 28.7 % — ABNORMAL LOW (ref 36.0–46.0)
Hemoglobin: 9.1 g/dL — ABNORMAL LOW (ref 12.0–15.0)
MCH: 26.9 pg (ref 26.0–34.0)
MCHC: 31.7 g/dL (ref 30.0–36.0)
MCV: 84.9 fL (ref 78.0–100.0)
Platelets: 338 10*3/uL (ref 150–400)
RBC: 3.38 MIL/uL — ABNORMAL LOW (ref 3.87–5.11)
RDW: 16.1 % — ABNORMAL HIGH (ref 11.5–15.5)
WBC: 7.2 10*3/uL (ref 4.0–10.5)

## 2010-10-23 LAB — GLUCOSE, CAPILLARY
Glucose-Capillary: 118 mg/dL — ABNORMAL HIGH (ref 70–99)
Glucose-Capillary: 104 mg/dL — ABNORMAL HIGH (ref 70–99)

## 2010-10-23 LAB — TRIGLYCERIDES: Triglycerides: 101 mg/dL (ref <150)

## 2010-10-23 LAB — MAGNESIUM: Magnesium: 2 mg/dL (ref 1.5–2.5)

## 2010-10-23 LAB — PHOSPHORUS: Phosphorus: 3.5 mg/dL (ref 2.3–4.6)

## 2010-10-23 LAB — CHOLESTEROL, TOTAL: Cholesterol: 123 mg/dL (ref 0–200)

## 2010-10-23 LAB — PREALBUMIN: Prealbumin: 13.8 mg/dL — ABNORMAL LOW (ref 17.0–34.0)

## 2010-10-24 ENCOUNTER — Inpatient Hospital Stay (HOSPITAL_COMMUNITY): Payer: Medicare Other

## 2010-10-24 LAB — GLUCOSE, CAPILLARY
Glucose-Capillary: 119 mg/dL — ABNORMAL HIGH (ref 70–99)
Glucose-Capillary: 117 mg/dL — ABNORMAL HIGH (ref 70–99)
Glucose-Capillary: 114 mg/dL — ABNORMAL HIGH (ref 70–99)

## 2010-10-25 ENCOUNTER — Ambulatory Visit: Payer: Medicare Other | Admitting: Pulmonary Disease

## 2010-10-25 ENCOUNTER — Ambulatory Visit: Payer: Medicare Other | Admitting: Nurse Practitioner

## 2010-10-25 LAB — BASIC METABOLIC PANEL
BUN: 20 mg/dL (ref 6–23)
CO2: 29 mEq/L (ref 19–32)
Calcium: 8.7 mg/dL (ref 8.4–10.5)
Chloride: 104 mEq/L (ref 96–112)
Creatinine, Ser: <0.47 mg/dL — ABNORMAL LOW (ref 0.50–1.10)
Glucose, Bld: 122 mg/dL — ABNORMAL HIGH (ref 70–99)
Potassium: 4.6 mEq/L (ref 3.5–5.1)
Sodium: 137 mEq/L (ref 135–145)

## 2010-10-25 LAB — GLUCOSE, CAPILLARY
Glucose-Capillary: 104 mg/dL — ABNORMAL HIGH (ref 70–99)
Glucose-Capillary: 111 mg/dL — ABNORMAL HIGH (ref 70–99)

## 2010-10-25 IMAGING — CR DG CHEST 2V
2 series · 2 of 2 positions shown · non-contrast
Comparison: 12/24/2009, 12/22/2009

CLINICAL DATA: Shortness of breath, weakness, pneumonia

CHEST - 2 VIEW

[w chest pa]
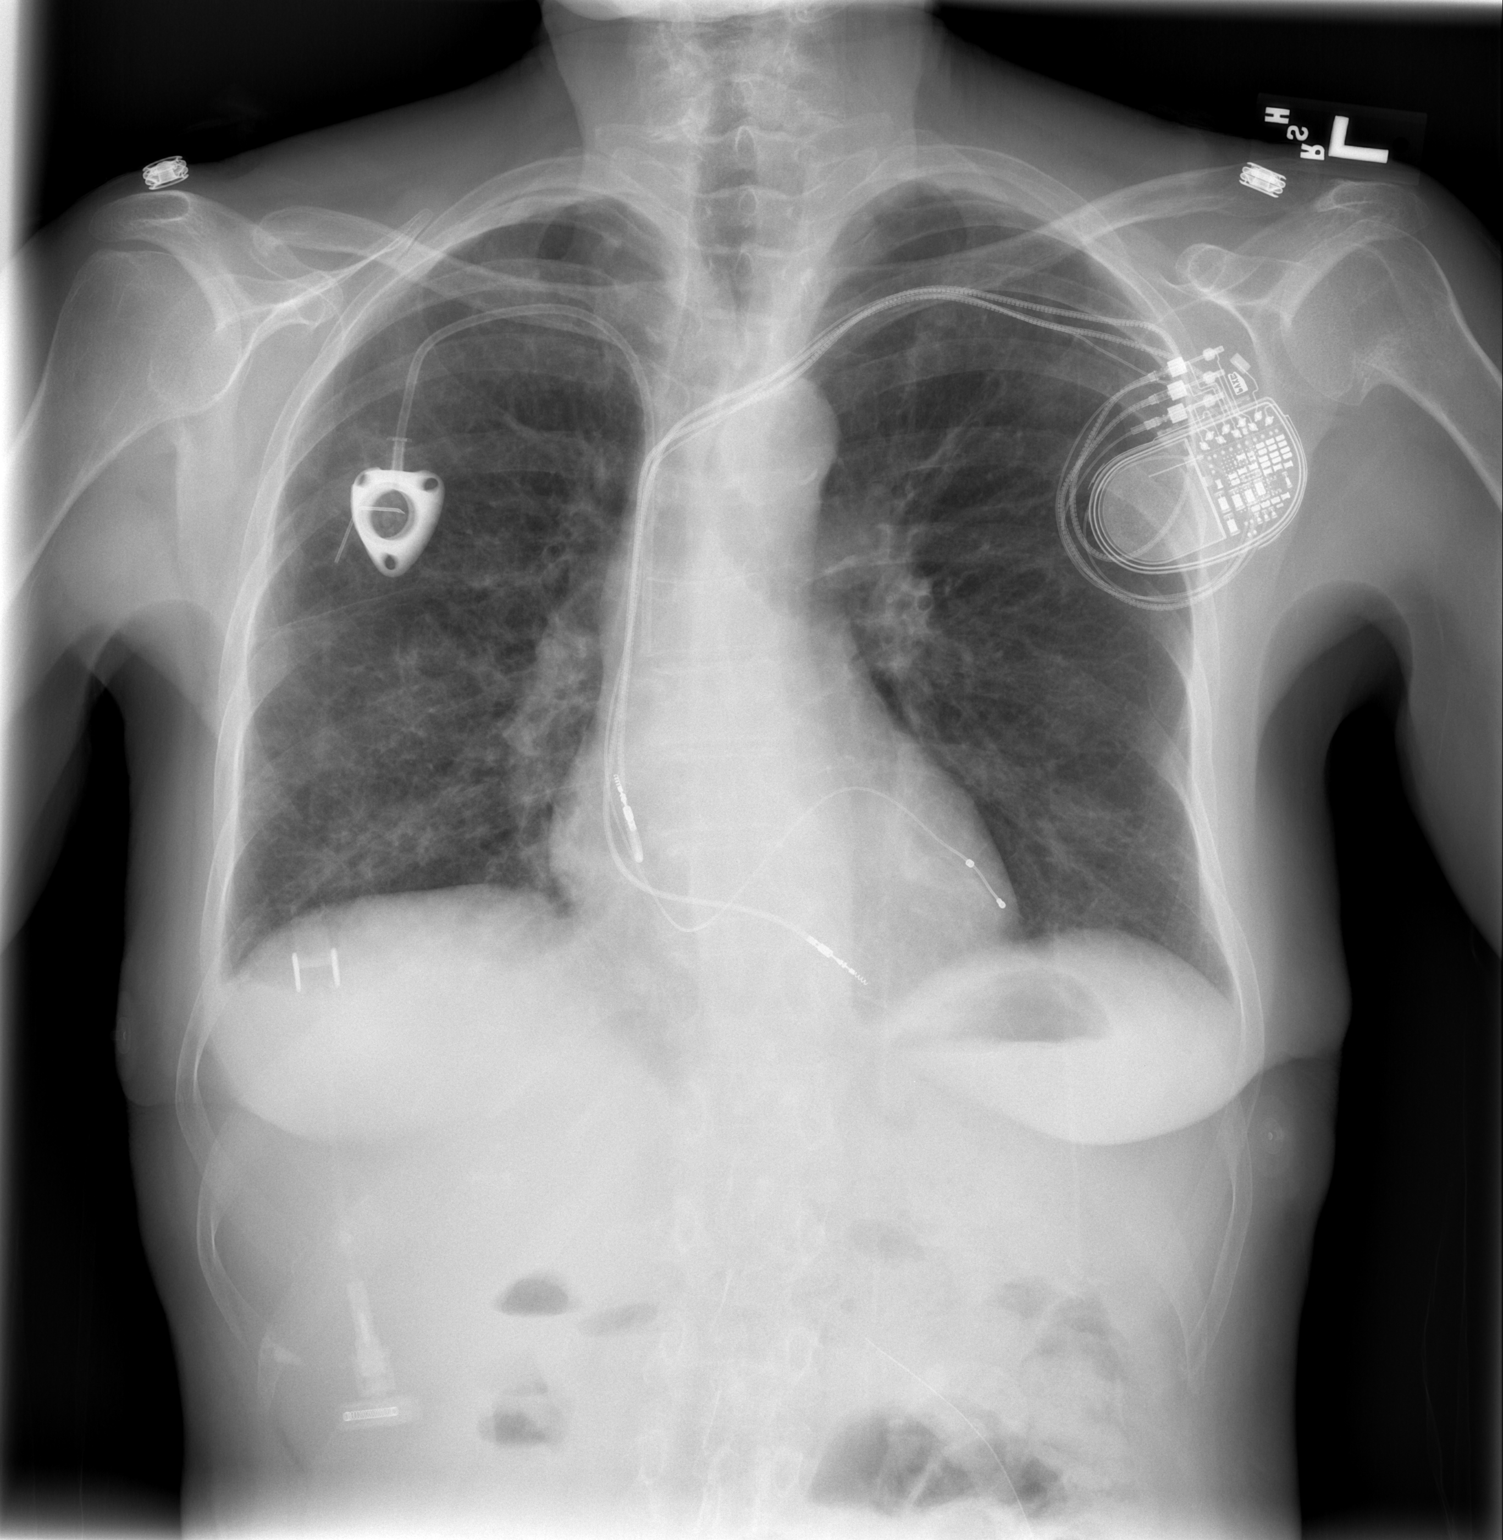

[w chest lat]
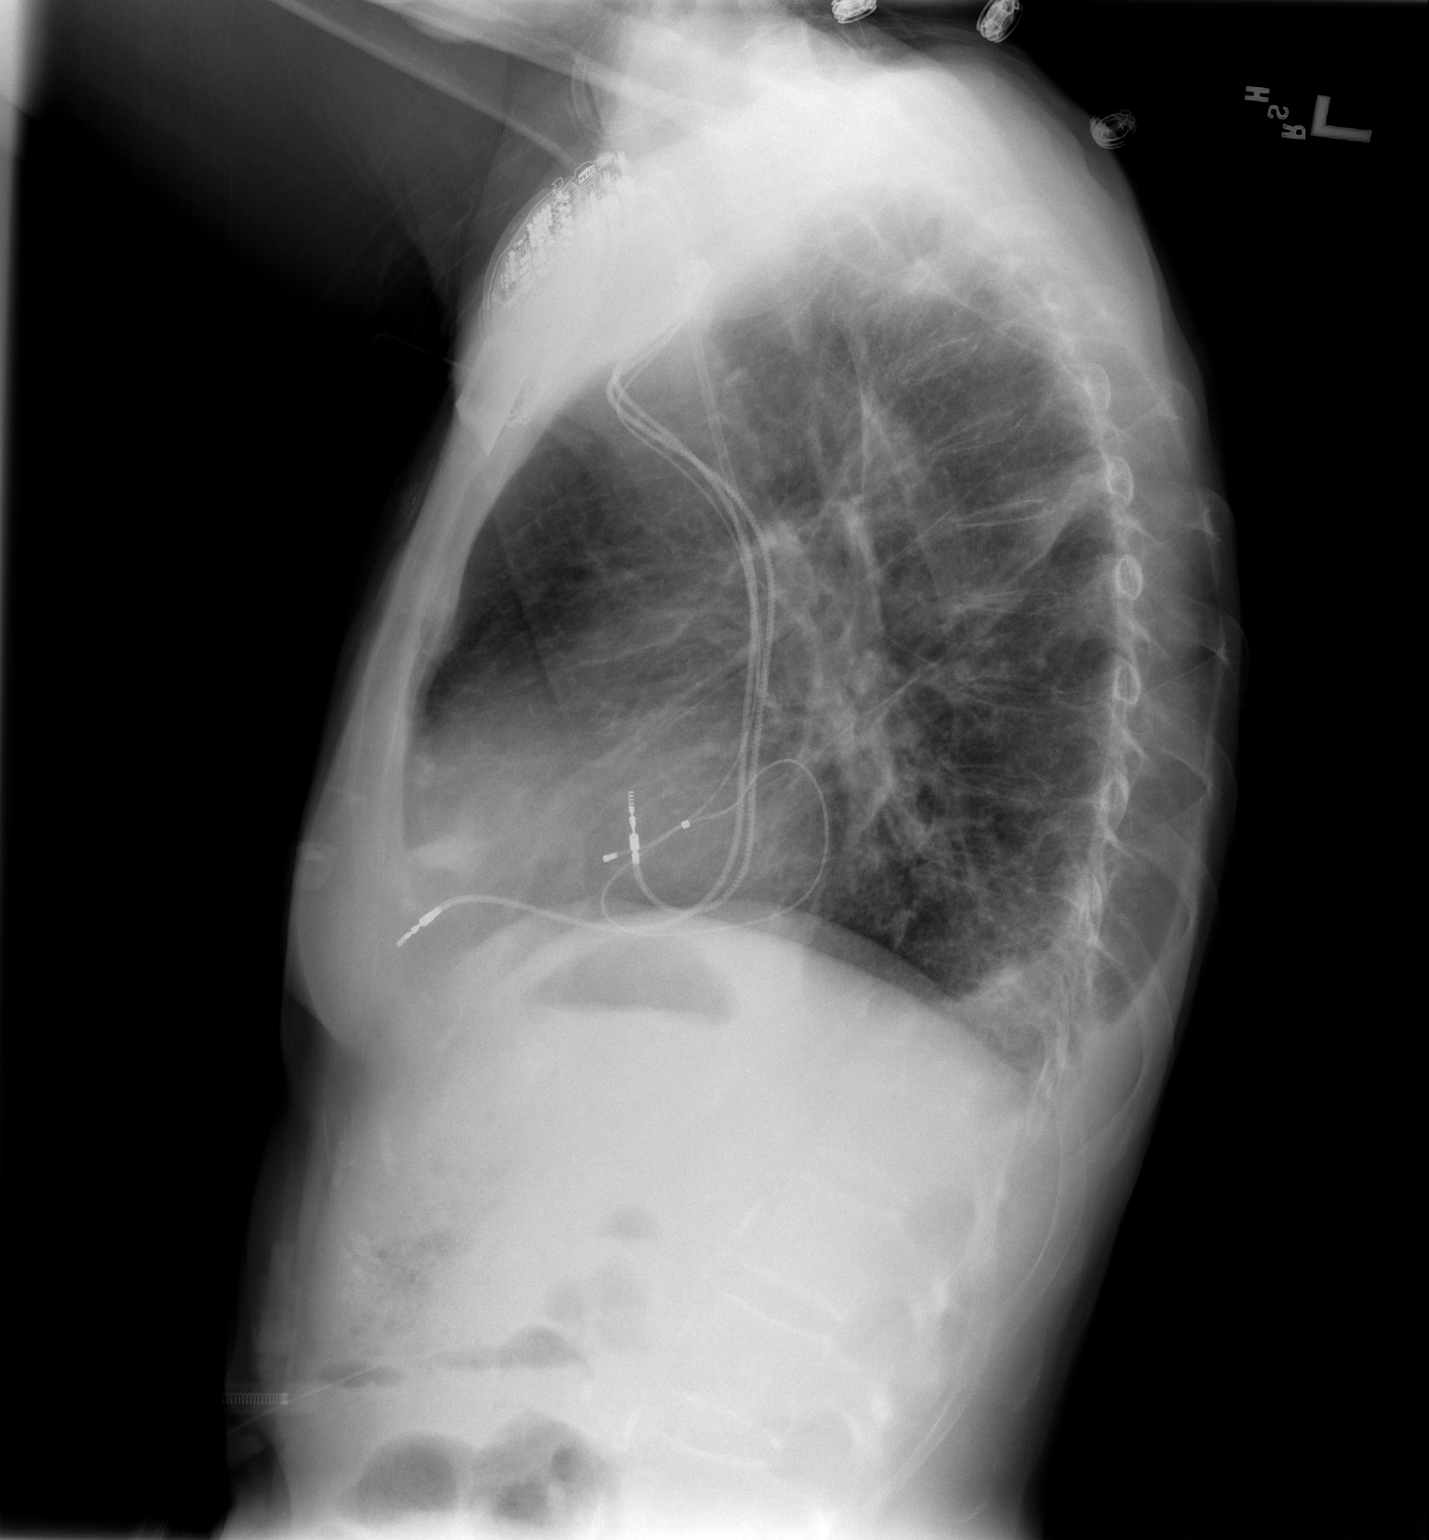

[2 of 2 positions shown; findings below may reference images not displayed]

FINDINGS: Right subclavian port catheter tip in the mid SVC.  Left
subclavian pacemaker noted.  Stable heart size and vascularity.
Mild central bronchial thickening and interstitial prominence.
Improvement in ill-defined patchy right lung and left lower lobe
retrocardiac airspace disease.  Trace right effusion noted.  No
pneumothorax.  Trachea is midline.  Gastrostomy also noted.
IMPRESSION: Improving right lung and left lower lobe patchy airspace disease.
Trace right effusion.

## 2010-10-26 LAB — COMPREHENSIVE METABOLIC PANEL
ALT: 17 U/L (ref 0–35)
AST: 19 U/L (ref 0–37)
Albumin: 2.2 g/dL — ABNORMAL LOW (ref 3.5–5.2)
Alkaline Phosphatase: 148 U/L — ABNORMAL HIGH (ref 39–117)
BUN: 17 mg/dL (ref 6–23)
CO2: 29 mEq/L (ref 19–32)
Calcium: 8.6 mg/dL (ref 8.4–10.5)
Chloride: 101 mEq/L (ref 96–112)
Creatinine, Ser: 0.48 mg/dL — ABNORMAL LOW (ref 0.50–1.10)
GFR calc Af Amer: >60 mL/min (ref >60)
GFR calc non Af Amer: >60 mL/min (ref >60)
Glucose, Bld: 102 mg/dL — ABNORMAL HIGH (ref 70–99)
Potassium: 4.4 mEq/L (ref 3.5–5.1)
Sodium: 134 mEq/L — ABNORMAL LOW (ref 135–145)
Total Bilirubin: 0.1 mg/dL — ABNORMAL LOW (ref 0.3–1.2)
Total Protein: 5.5 g/dL — ABNORMAL LOW (ref 6.0–8.3)

## 2010-10-26 LAB — GLUCOSE, CAPILLARY: Glucose-Capillary: 107 mg/dL — ABNORMAL HIGH (ref 70–99)

## 2010-10-26 LAB — PHOSPHORUS: Phosphorus: 3.3 mg/dL (ref 2.3–4.6)

## 2010-10-26 LAB — MAGNESIUM: Magnesium: 2 mg/dL (ref 1.5–2.5)

## 2010-10-30 ENCOUNTER — Encounter: Payer: Self-pay | Admitting: Internal Medicine

## 2010-10-31 ENCOUNTER — Ambulatory Visit: Payer: Medicare Other | Admitting: Nurse Practitioner

## 2010-11-01 ENCOUNTER — Telehealth: Payer: Self-pay | Admitting: Gastroenterology

## 2010-11-01 NOTE — Telephone Encounter (Signed)
On call note at 2100. Mary at [facility] called to report results of stat CBC drawn at 1900 ordered under Dr. Algis Downs. [name]'s name. Lab results were FAXed to [facility] [place]. W=12.2, Hb=8.9, plts=160k. I reviewed recent lab and hospitalization records. WBC has increased above last level in EPIC. Hb and plts both stable. Will forward to Dr. Juanda Chance for further review in am.

## 2010-11-02 NOTE — Telephone Encounter (Signed)
Reviewed. Pt has a f/up appointment with me. No immediate action required

## 2010-11-03 ENCOUNTER — Telehealth: Payer: Self-pay | Admitting: Internal Medicine

## 2010-11-03 ENCOUNTER — Ambulatory Visit: Payer: Medicare Other | Admitting: Internal Medicine

## 2010-11-03 NOTE — Telephone Encounter (Signed)
Do not charge for late cancelation DB

## 2010-11-03 NOTE — Telephone Encounter (Signed)
Patient will need 2 units PRBC's per Dr. Juanda Chance. Set up for next week.Scheduled at [facility] short stay on 11/08/10 at 8:00 AM

## 2010-11-04 IMAGING — CR DG ABDOMEN ACUTE W/ 1V CHEST
3 series · 3 of 3 positions shown · non-contrast
Comparison: 02/22/2009; 12/27/2009; 12/24/2009

CLINICAL DATA: Hypotension.  Chest pain.  Diarrhea.  Abdominal
pain.

ACUTE ABDOMEN SERIES (ABDOMEN 2 VIEW & CHEST 1 VIEW)

[w chest pa]
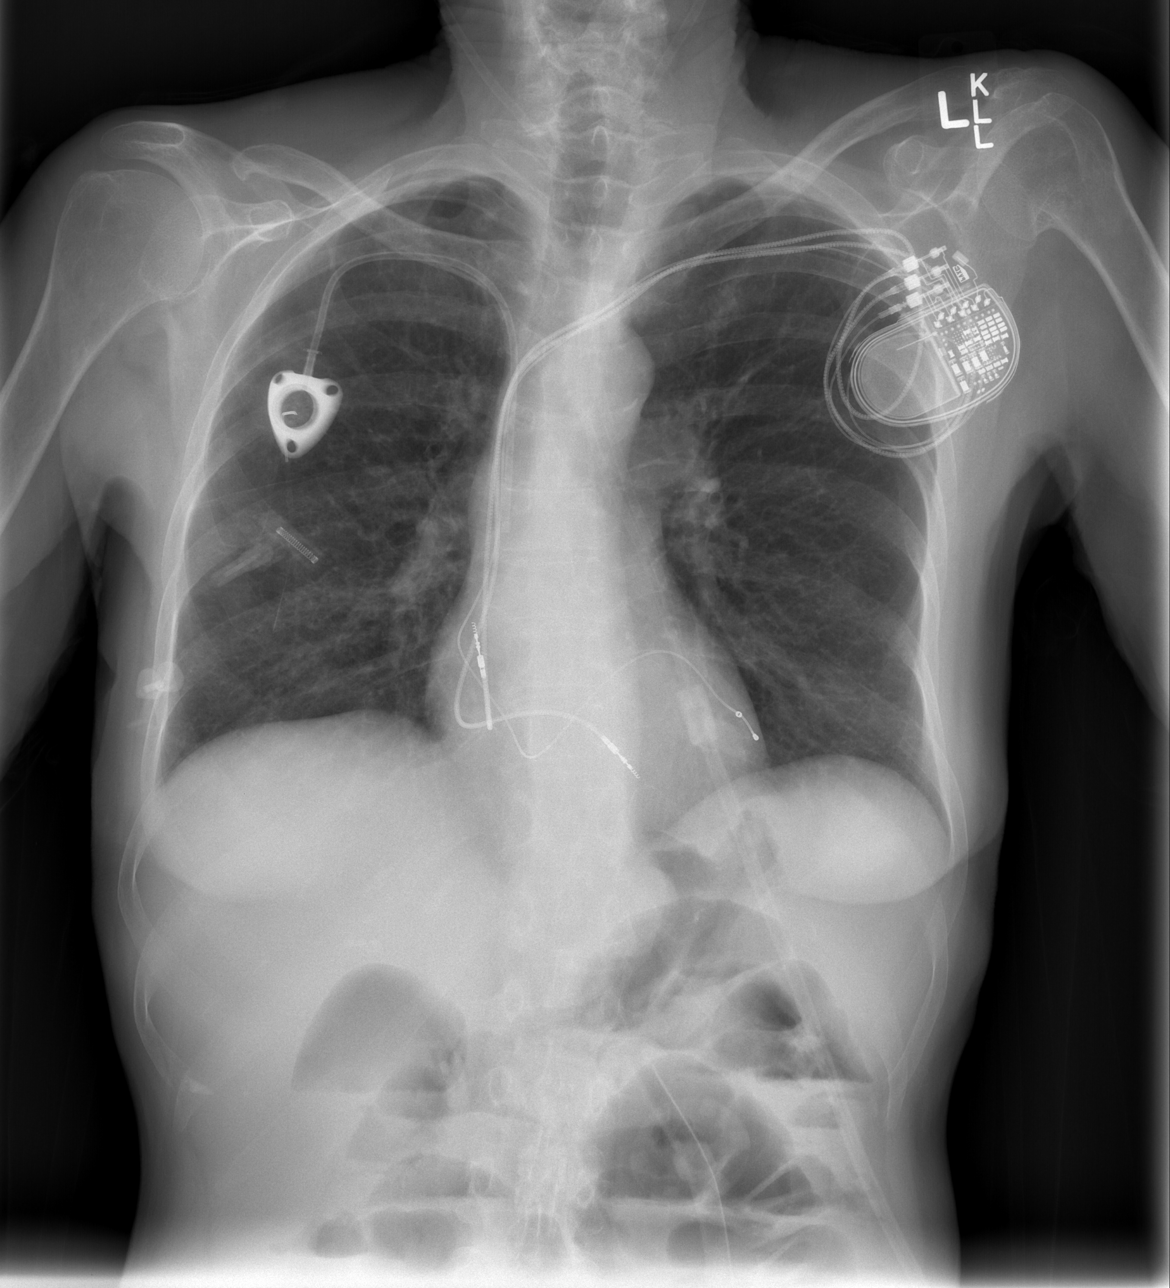

[w abdomen upright *]
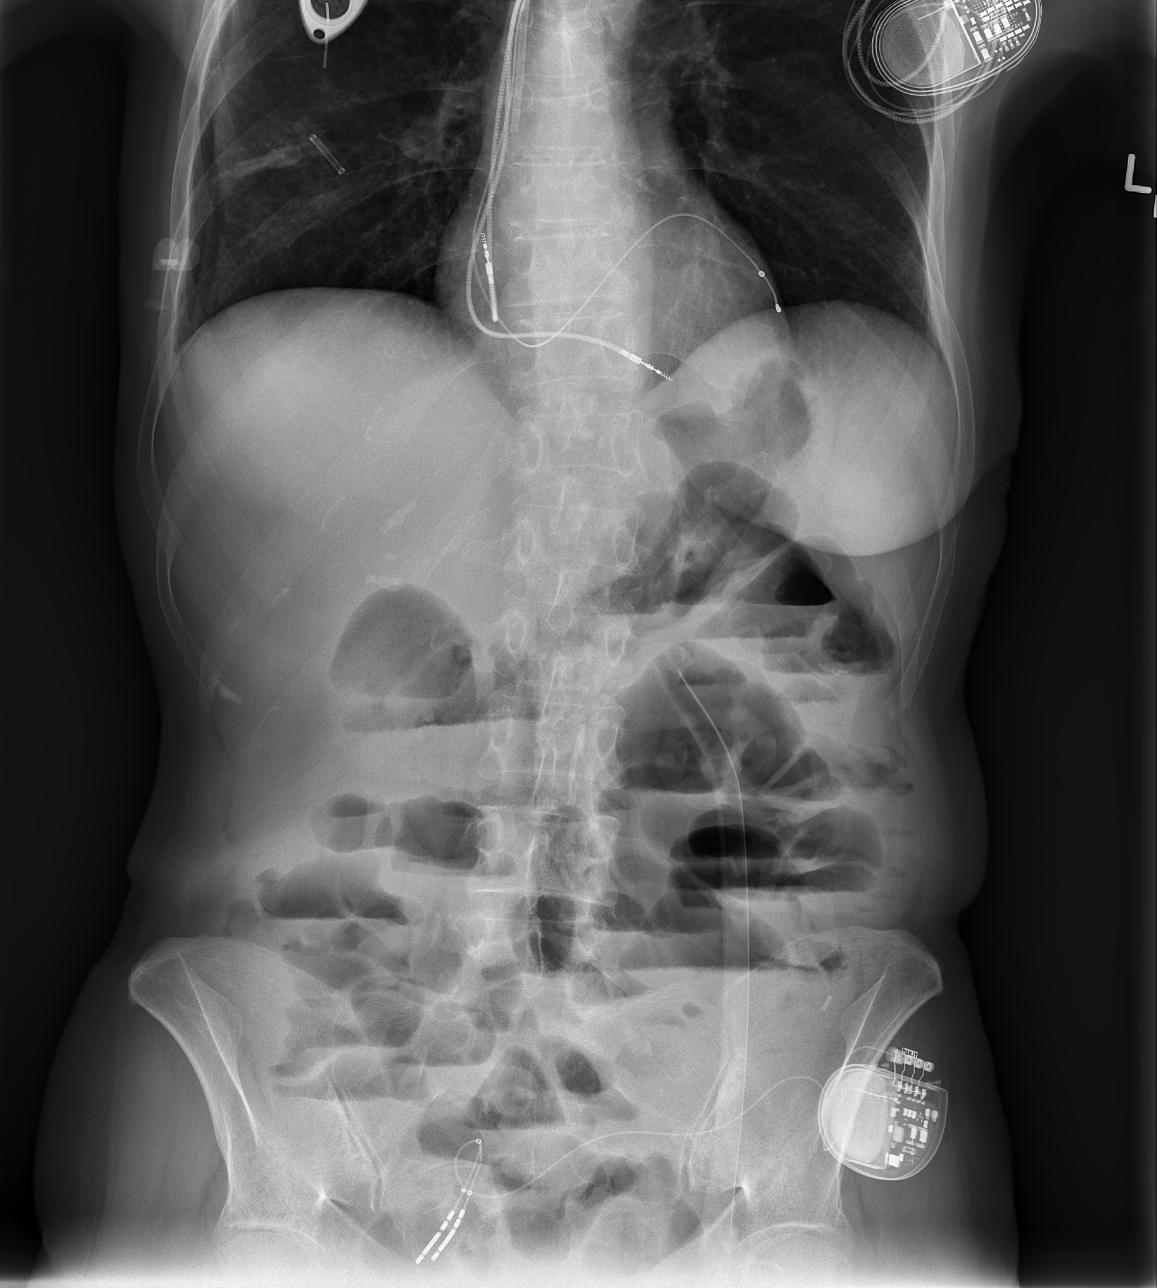

[t abdomen supine]
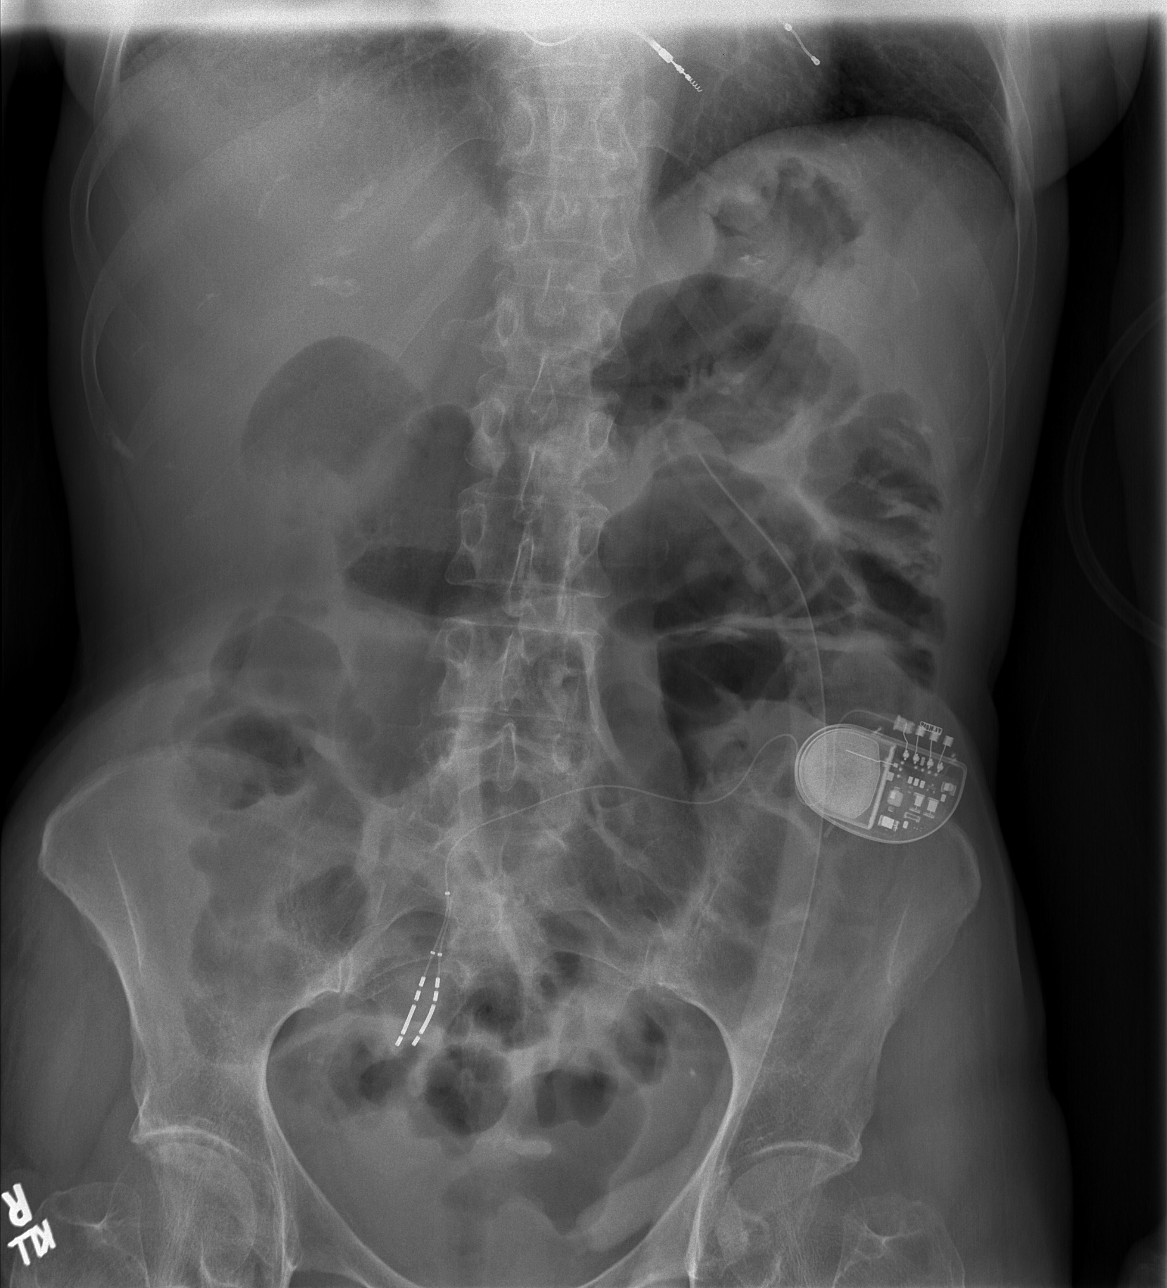

[3 of 3 positions shown; findings below may reference images not displayed]

FINDINGS: Further improvement in right basilar airspace opacity
noted.

Right-sided Port-A-Cath and left-sided pacer are in place.

No residual blunting the right costophrenic angle is identified.

Tubing projects over the left abdomen, and a stimulator device
projects over the right sacrum.

Multiple air-fluid levels are present in large and small bowel.
Both large and small bowel may be somewhat distended.  The
appearance favors ileus although a distal obstruction cannot be
readily ruled out.

Mild bony demineralization noted.
IMPRESSION: 1.  Mildly distended small and large bowel with air-fluid levels in
both small and large bowel.  The appearance is nonspecific but does
favor ileus over distal bowel obstruction.  Correlate with bowel
sounds.
2.  Essentially complete clearing of the prior right lower lobe
airspace opacity.  No definite residual pleural effusion
identified.

## 2010-11-06 ENCOUNTER — Other Ambulatory Visit: Payer: Self-pay | Admitting: Family Medicine

## 2010-11-06 NOTE — Discharge Summary (Signed)
[name], [name]               ACCOUNT NO.:  [id number]  MEDICAL RECORD NO.:  [id number]  LOCATION:  1314                         FACILITY:  [facility]  PHYSICIAN:  Erick Blinks, MD         DATE OF BIRTH:  [birth year]  DATE OF ADMISSION:  10/20/2010 DATE OF DISCHARGE:  10/26/2010                              DISCHARGE SUMMARY   ADMITTING DIAGNOSES: 69. A 64 year old female with chronic low-grade small-bowel obstruction     with superimposed acute illness with nausea, vomiting, abdominal     pain, and diarrhea, rule out recurrent obstruction, rule out     infectious gastroenteritis. 2. Chronic protein-calorie malnutrition on home TNA secondary to     dysfunctional bowel. 3. Fibromyalgia with chronic pain syndrome and chronic narcotic use. 4. Nonischemic cardiomyopathy. 5. Gastroparesis. 6. Degenerative disk disease. 7. 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 8. History of atrial fibrillation.  No current anticoagulation. 9. Status post pacemaker placement. 10.Status post multiple abdominal surgeries. 11.Left bundle-branch block. 12.Hypertension. 13.Chronic obstructive pulmonary disease.  DISCHARGE DIAGNOSES: 1. Resolved acute partial small-bowel obstruction superimposed on     chronic low-grade small-bowel obstruction. 2. Chronic protein-calorie malnutrition on home TNA secondary to     dysfunctional bowel. 3. Fibromyalgia with chronic pain syndrome and chronic narcotic use. 4. Nonischemic cardiomyopathy. 5. Gastroparesis. 6. Degenerative disk disease. 7. 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 8. History of atrial fibrillation.  No current anticoagulation. 9. Status post pacemaker placement. 10.Status post multiple abdominal surgeries. 11.Left bundle-branch block. 12.Hypertension. 13.Chronic obstructive pulmonary disease.  CONSULTATIONS:  None.  PROCEDURES:  None.  BRIEF HISTORY:  [name] is a very nice 65 year old white  female well known to Dr. [name] with history of chronic low-grade bowel obstructions with history of chronic low-grade small-bowel obstruction. She also had an upper GI bleed in October 2011, which was secondary to esophageal ulcers in the setting of Pradaxa use.  The patient last had surgery in February 2011 with extensive lysis of adhesions for 4 hours and a gastrostomy tube placement for decompression for her chronic bowel obstruction.  However, because of chronic malfunctioning and leaking of the tube, it was replaced in December 2011 and then ultimately removed in March 2012.  Since then, she has had a persistent gastrocutaneous fistula.  There was an attempt to close the fistula in April, but the procedure was unsuccessful and she continues to have a small amount of drainage.  At this time, the patient has been managing at home on basically a liquid diet with occasional solid food and has been placed on home TNA as well as domperidone in addition to her of chronic narcotics.  At this time, she was seen and evaluated in the office with complaints of weakness, nausea, vomiting, and diarrhea and was readmitted for hydration and further diagnostic evaluation and supportive medical management.  LABORATORY STUDIES:  On August 5, WBC of 5.6, hemoglobin 8.4, hematocrit of 27.1.  Electrolytes within normal limits.  Glucose was 109.  Liver function studies showed a total bilirubin of 0.1, alk phos 215, SGOT 38, SGPT 39, albumin is 2.1.  Cholesterol 112, triglycerides 71.  Prealbumin of 13.  Follow up  on August 6, the prealbumin was 13.8, WBC is 7.2, hemoglobin 9.1, hematocrit of 28.7, and electrolytes on August 8 were within normal limits.  X-RAY STUDIES:  On August 5, there was NG tube be in place with decrease in small-bowel obstructive pattern.  On August 6, KUB showed nonspecific bowel gas pattern with scattered air fluid levels, no definite obstruction.  On August 7, no  significant change with nonspecific bowel gas pattern.  HOSPITAL COURSE:  The patient was initially admitted to the service of Dr. Stan Head, who was covering on-call.  NG tube was placed for decompression.  She was placed on TNA round-the-clock and IV fluids. She was given her usual regimen of medications and in addition, IV Zofran and Dilaudid as needed.  She was started on IV Flagyl to cover the possibility of bacterial overgrowth or an infectious etiology, given her recent diarrhea.  Stool cultures were also ordered; however, her diarrhea stopped after admission.  Over the first couple of days, she required NG suction, which we gradually started clamping and she was able to tolerate this by August 6 and have her NG tube removed, likewise her abdominal films improved.  She began passing flatus and eventually had some loose stools.  She was seen by Nutrition and restarted on her usual regimen of cyclic TNA from home.  In addition, they recommended a [employer] twice daily for some additional calories.  By August 7, she was started on clear liquids and by August 8, we were able to advance this to full liquids which she tolerated without difficulty. She has continued to complain of some queasiness, but has not had any real nausea or vomiting over at least the past 48 hours.  She is having bowel movements, which are loose at this time, but just 2 over the past 24 hours and again had not had any bowel movements for 2 days prior to that.  She is discharged to home with plans to follow up with Dr. [name] in the office on August 17th at 3:30 p.m. to call for any problems in the interim.  She will continue her usual regimen of cyclic TNA 12 hours per day.  She will maintain a clear liquid diet with addition of [employer] twice daily.  She was given a prescription for Phenergan 25 mg q.6 h. as needed for nausea, which seems to work for her better than Zofran.  Her  other home meds include amiodarone 200 mg daily, Carafate slurry 1 g 4 times daily, Coreg 6.25 twice daily, vitamin B subcu 1000 mcg monthly, domperidone 10 mg three times a day before meals, fentanyl patch 50 mcg q.72 h., Flexeril 10 mg three times daily, Lasix 20 mg q.a.m., gabapentin 600 mg four times daily, meloxicam 1 tablet every morning, nitroglycerin sublingual as needed, Percocet 10/325 q.4 h. as needed, Protonix 40 mg q.a.m., Spiriva 18 mcg inhaled once daily, temazepam 30 mg q.h.s. and again right now she will not be using Zofran which she has had previously.  CONDITION ON THIS DISCHARGE:  Stable and improved.     Amy Esterwood, PA-C   ______________________________ Erick Blinks, MD    AE/MEDQ  D:  10/26/2010  T:  10/26/2010  Job:  [id number]  cc:   Hedwig Morton. Juanda Chance, MD [address] [place] [address]  Electronically Signed by Mike Gip PA-C on 10/30/2010 11:20:18 AM Electronically Signed by Erick Blinks MD on 11/06/2010 02:07:59 PM

## 2010-11-06 NOTE — Telephone Encounter (Signed)
Last seen 08/15/10 and Flexeril filled 08/15/10 and Temazepam 09/28/10 please advise     KP

## 2010-11-07 ENCOUNTER — Telehealth: Payer: Self-pay | Admitting: Internal Medicine

## 2010-11-07 MED ORDER — CYCLOBENZAPRINE HCL 10 MG PO TABS: 10.0000 mg | ORAL_TABLET | Freq: Three times a day (TID) | ORAL | Status: DC | PRN

## 2010-11-07 MED ORDER — TEMAZEPAM 15 MG PO CAPS: 15.0000 mg | ORAL_CAPSULE | Freq: Every evening | ORAL | Status: DC | PRN

## 2010-11-07 NOTE — Telephone Encounter (Signed)
Left a message for pharmacist to call me back

## 2010-11-07 NOTE — Telephone Encounter (Signed)
Spoke with pharmacist at [facility]. Based on patient's labs(he is faxing these over for [place]) she is dehydrated.  Would like an order to add IVF as needed 2-3 times/week. Please, advise.

## 2010-11-07 NOTE — Telephone Encounter (Signed)
Faxed.   KP 

## 2010-11-07 NOTE — Telephone Encounter (Signed)
OK to add 1000 cc of IVF 2-3x/week, ?? NaCl ? I will leave it up to his discretion to order the IV solution.

## 2010-11-08 ENCOUNTER — Encounter (HOSPITAL_COMMUNITY): Payer: Medicare Other | Attending: Internal Medicine

## 2010-11-08 ENCOUNTER — Other Ambulatory Visit: Payer: Self-pay | Admitting: Internal Medicine

## 2010-11-08 DIAGNOSIS — K219 Gastro-esophageal reflux disease without esophagitis: Secondary | ICD-10-CM | POA: Insufficient documentation

## 2010-11-08 DIAGNOSIS — D649 Anemia, unspecified: Secondary | ICD-10-CM | POA: Insufficient documentation

## 2010-11-08 DIAGNOSIS — Z9981 Dependence on supplemental oxygen: Secondary | ICD-10-CM | POA: Insufficient documentation

## 2010-11-08 DIAGNOSIS — I10 Essential (primary) hypertension: Secondary | ICD-10-CM | POA: Insufficient documentation

## 2010-11-08 DIAGNOSIS — K5730 Diverticulosis of large intestine without perforation or abscess without bleeding: Secondary | ICD-10-CM | POA: Insufficient documentation

## 2010-11-08 DIAGNOSIS — M1990 Unspecified osteoarthritis, unspecified site: Secondary | ICD-10-CM | POA: Insufficient documentation

## 2010-11-08 DIAGNOSIS — Z79899 Other long term (current) drug therapy: Secondary | ICD-10-CM | POA: Insufficient documentation

## 2010-11-08 DIAGNOSIS — I2510 Atherosclerotic heart disease of native coronary artery without angina pectoris: Secondary | ICD-10-CM | POA: Insufficient documentation

## 2010-11-08 DIAGNOSIS — IMO0001 Reserved for inherently not codable concepts without codable children: Secondary | ICD-10-CM | POA: Insufficient documentation

## 2010-11-08 DIAGNOSIS — J4489 Other specified chronic obstructive pulmonary disease: Secondary | ICD-10-CM | POA: Insufficient documentation

## 2010-11-08 DIAGNOSIS — J449 Chronic obstructive pulmonary disease, unspecified: Secondary | ICD-10-CM | POA: Insufficient documentation

## 2010-11-08 NOTE — Telephone Encounter (Signed)
Spoke with [name] patient's pharmacist at [facility] and gave him Dr. Regino Schultze recommendation.

## 2010-11-09 ENCOUNTER — Ambulatory Visit: Payer: Medicare Other | Admitting: *Deleted

## 2010-11-09 ENCOUNTER — Other Ambulatory Visit: Payer: Self-pay | Admitting: Family Medicine

## 2010-11-09 ENCOUNTER — Encounter: Payer: Self-pay | Admitting: Internal Medicine

## 2010-11-09 DIAGNOSIS — M810 Age-related osteoporosis without current pathological fracture: Secondary | ICD-10-CM

## 2010-11-09 LAB — CROSSMATCH
ABO/RH(D): A NEG
Antibody Screen: NEGATIVE
Unit division: 0
Unit division: 0

## 2010-11-09 MED ORDER — DENOSUMAB 60 MG/ML ~~LOC~~ SOLN
60.0000 mg | Freq: Once | SUBCUTANEOUS | Status: AC
  Administered 2010-11-09: 60 mg via SUBCUTANEOUS

## 2010-11-09 MED ORDER — OXYCODONE-ACETAMINOPHEN 10-325 MG PO TABS: 1.0000 | ORAL_TABLET | ORAL | Status: DC | PRN

## 2010-11-09 MED ORDER — DENOSUMAB 60 MG/ML ~~LOC~~ SOLN: 60.0000 mg | Freq: Once | SUBCUTANEOUS | Status: DC

## 2010-11-09 MED ORDER — FENTANYL 50 MCG/HR TD PT72: 1.0000 | MEDICATED_PATCH | TRANSDERMAL | Status: DC

## 2010-11-09 NOTE — Telephone Encounter (Signed)
Patient aware Rx ready for pick up.      KP 

## 2010-11-09 NOTE — Telephone Encounter (Signed)
Last seen 08/15/10 and filled 10/10/10 please advise    KP

## 2010-11-09 NOTE — Telephone Encounter (Signed)
Refill x1 each 

## 2010-11-13 ENCOUNTER — Ambulatory Visit (INDEPENDENT_AMBULATORY_CARE_PROVIDER_SITE_OTHER): Payer: Medicare Other | Admitting: Nurse Practitioner

## 2010-11-13 ENCOUNTER — Other Ambulatory Visit: Payer: Self-pay | Admitting: Family Medicine

## 2010-11-13 ENCOUNTER — Encounter: Payer: Self-pay | Admitting: Nurse Practitioner

## 2010-11-13 DIAGNOSIS — I428 Other cardiomyopathies: Secondary | ICD-10-CM

## 2010-11-13 DIAGNOSIS — G8929 Other chronic pain: Secondary | ICD-10-CM

## 2010-11-13 DIAGNOSIS — I10 Essential (primary) hypertension: Secondary | ICD-10-CM

## 2010-11-13 DIAGNOSIS — I4891 Unspecified atrial fibrillation: Secondary | ICD-10-CM

## 2010-11-13 NOTE — Assessment & Plan Note (Signed)
Last EF was 45 to 50% in January of 2012. She seems fairly compensated. No change in her medicines. She has her CRT device in place. We will see her back in about 4 months. May set up echo on return. She will see Dr. Antoine Poche on return in light of Dr. Ronnald Nian retirement. Patient is agreeable to this plan and will call if any problems develop in the interim.

## 2010-11-13 NOTE — Progress Notes (Signed)
[name] Date of Birth: 10/01/1945   History of Present Illness: [name] is seen today for her 4 month visit. She is seen for Dr. Antoine Poche and Dr. Graciela Husbands. She is a former patient of Dr. Ronnald Nian. Her history is quite complex and extensive. She is now on oxygen. She is not having chest pain. Her rhythm has been ok. She remains on chronic amiodarone. No recent TSH but has had recent CMET earlier this month. She continues to have issues with her bowel and requires admission frequently. She has been transfused recently. Weight is up a little since going on TPN at night. She no longer has her PICC but has a port in place. She has cut her dose of Lasix back. She seems to be ok from our standpoint.   Current Outpatient Prescriptions on File Prior to Visit  Medication Sig Dispense Refill  . AMBULATORY NON FORMULARY MEDICATION Domperidone 10 mg Take 1 tablet by mouth three times daily before meals  240 tablet  0  . amiodarone (PACERONE) 200 MG tablet Take 200 mg by mouth daily.        . carvedilol (COREG) 6.25 MG tablet Take 6.25 mg by mouth 2 (two) times daily with a meal.        . cyclobenzaprine (FLEXERIL) 10 MG tablet Take 1 tablet (10 mg total) by mouth 3 (three) times daily as needed.  60 tablet  2  . denosumab (PROLIA) 60 MG/ML SOLN Inject 60 mg into the skin. Once a year      . fentaNYL (DURAGESIC - DOSED MCG/HR) 50 MCG/HR Place 1 patch (50 mcg total) onto the skin every 3 (three) days.  10 patch  0  . furosemide (LASIX) 20 MG tablet Take 40mg  of Furosemide daily alternating with 80mg  of Furosemide.  90 tablet  3  . gabapentin (NEURONTIN) 600 MG tablet Take 1 tablet (600 mg total) by mouth 4 (four) times daily.  120 tablet  2  . meloxicam (MOBIC) 15 MG tablet TAKE 1/2 TO 1 TABLET DAILY AS NEEDED  30 tablet  0  . nitroGLYCERIN (NITROSTAT) 0.4 MG SL tablet Place 0.4 mg under the tongue every 5 (five) minutes as needed.        [name] oxyCODONE-acetaminophen (ENDOCET) 10-325 MG per tablet Take 1  tablet by mouth every 4 (four) hours as needed.  120 tablet  0  . pantoprazole (PROTONIX) 40 MG tablet Take one po BID  180 tablet  0  . sucralfate (CARAFATE) 1 GM/10ML suspension Take 10 cc po QID x 5 days  420 mL  0  . temazepam (RESTORIL) 15 MG capsule Take 1 capsule (15 mg total) by mouth at bedtime as needed.  30 capsule  0  . tiotropium (SPIRIVA HANDIHALER) 18 MCG inhalation capsule Place 18 mcg into inhaler and inhale daily.        . Nutritional Supplements (RESOURCE BREEZE PO) 2 (two) times daily.       . ondansetron (ZOFRAN) 8 MG tablet TAKE 1 TABLET EVERY 12 HR AS NEEDED  20 tablet  0   Current Facility-Administered Medications on File Prior to Visit  Medication Dose Route Frequency Provider Last Rate Last Dose  . cyanocobalamin ((VITAMIN B-12)) injection 1,000 mcg  1,000 mcg Intramuscular Q30 days Hart Carwin, MD   1,000 mcg at 10/19/10 1324    Allergies  Allergen Reactions  . Pentazocine Lactate   . Pradaxa   . Sulfonamide Derivatives   . Talwin  Past Medical History  Diagnosis Date  . Small bowel obstruction   . Anemia   . HTN (hypertension)   . Osteoarthritis   . GERD (gastroesophageal reflux disease)   . CAD (coronary artery disease)     mild disease per cath in 2008  . LBBB (left bundle branch block)   . Atrial fibrillation     on amiodarone  . 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 Fibromyalgia   . Diverticulosis   . COPD (chronic obstructive pulmonary disease)     -06/28/08 PFT FEV1 1.95 (97%), FVC 2.79 (101%), FEV1% 70, TLC 3.88 (87%), DLCO 64%  . Pneumonia - 03/09, 12/09, 02/10, 06/10  . Pleural effusion      s/p right thoracentesis 03/09  . Small bowel obstruction   . DDD (degenerative disc disease)   . Urinary incontinence     has pacemaker stimulator in her bladder  . RLS (restless legs syndrome)   . Primary dilated cardiomyopathy     EF 45 to 50% per echo in Jan 2012  . Vitamin B12 deficiency   .  Gastroparesis   . Chronic nausea   . Chronic abdominal pain   . Chronic pain syndrome   . Gastrocutaneous fistula   . Pacemaker     CRT therapy; followed by Dr. Graciela Husbands  . H/O: GI bleed     from Pradaxa  . Oxygen dependent     Past Surgical History  Procedure Date  . Colectomy     Intestinal resection (5times),S/P PARTIAL COLECTOMY 2001  . Appendectomy   . Bladder surgery   . Tonsillectomy   . Total abdominal hysterectomy   . Pacemaker placement     CRT-PPM-Medtr[employer] in October 2011  . Peg placement   . Abdominal adhesion surgery     numerous surgeries  . Peg removed     with complications  . Cardiac catheterization 2008    History  Smoking status  . Former Smoker -- 1.0 packs/day for 20 years  . Types: Cigarettes  . Quit date: 03/19/1986  Smokeless tobacco  . Never Used    History  Alcohol Use No    Family History  Problem Relation Age of Onset  . Heart disease Father   . Stroke Mother   . Colon cancer Neg Hx     Review of Systems: The review of systems is positive for chronic DOE, now on oxygen. No chest pain. Rhythm has been ok. No falls. She is tolerating her medicines. She does have some weakness in her legs. She has become more deconditioned.  All other systems were reviewed and are negative.  Physical Exam: BP 98/70  Pulse 78  Ht 5\' 2"  (1.575 m)  Wt 101 lb 3.2 oz (45.904 kg)  BMI 18.51 kg/m2 Patient is very pleasant and in no acute distress. She looks chronically ill. Skin is warm and dry. Color is very sallow which is chronic.  HEENT is unremarkable. Normocephalic/atraumatic. PERRL. Sclera are nonicteric. Neck is supple. No masses. No JVD. Lungs are fairly clear. Cardiac exam shows a regular rate and rhythm. Pacemaker is in the left upper chest and looks good. Abdomen is soft. Extremities are without edema. Her pulses are intact bilaterally. Gait and ROM are intact. No gross neurologic deficits noted.   LABORATORY DATA:   Assessment / Plan:

## 2010-11-13 NOTE — Patient Instructions (Addendum)
Stay on your current medicines We will see you back in 4 months You will see Dr. Angelina Sheriff at your next visit Will consider repeating your ultrasound of your heart at that time Call for any problems.

## 2010-11-13 NOTE — Telephone Encounter (Signed)
Last seen 08/15/10 and filled  07-28-10  #120 2

## 2010-11-13 NOTE — Telephone Encounter (Signed)
Rx sent 

## 2010-11-13 NOTE — Assessment & Plan Note (Signed)
Blood pressure is ok. No change in her medicines. She has not had ACE or aldactone due to history of hyperkalemia.

## 2010-11-13 NOTE — Assessment & Plan Note (Addendum)
Appears to be in sinus by exam today. Remains on amiodarone. No longer on pradaxa due to GI bleeding. She has her pacemaker in place which is followed by Dr. Graciela Husbands. She needs a follow up TSH and we will have Home Health draw at their next visit.

## 2010-11-17 ENCOUNTER — Telehealth: Payer: Self-pay | Admitting: *Deleted

## 2010-11-17 NOTE — Telephone Encounter (Signed)
Dr. Juanda Chance, Please see labs on 10/20/10 from hospital(Vitamin B 12 level >2000). She is scheduled for Vitamin B12 injection on 11/21/10. Does she need to continue?

## 2010-11-17 NOTE — Telephone Encounter (Signed)
Dr. Juanda Chance, Please see labs on 10/20/10 from hospital(Vitamin B 12 level >2000).  She had received her B12 injection on 10/19/10.She is scheduled for Vitamin B12 injection on 11/21/10. Does she need to continue B 12?

## 2010-11-19 NOTE — Telephone Encounter (Signed)
She needs to stay on B12 indefinitely due to her malabsorption.

## 2010-11-21 ENCOUNTER — Other Ambulatory Visit: Payer: Self-pay | Admitting: Family Medicine

## 2010-11-21 ENCOUNTER — Ambulatory Visit (INDEPENDENT_AMBULATORY_CARE_PROVIDER_SITE_OTHER): Payer: Medicare Other | Admitting: Internal Medicine

## 2010-11-21 DIAGNOSIS — E538 Deficiency of other specified B group vitamins: Secondary | ICD-10-CM

## 2010-11-21 NOTE — Telephone Encounter (Signed)
Ok to change directions?

## 2010-11-21 NOTE — Telephone Encounter (Signed)
Please advise      KP 

## 2010-11-21 NOTE — Telephone Encounter (Signed)
rx for temazepam was called in with directions 1 per night- directions should have been 1 to 2 per night patient wants new rx -  [facility]

## 2010-11-22 ENCOUNTER — Other Ambulatory Visit: Payer: Self-pay | Admitting: *Deleted

## 2010-11-22 DIAGNOSIS — I4892 Unspecified atrial flutter: Secondary | ICD-10-CM

## 2010-11-22 MED ORDER — TEMAZEPAM 15 MG PO CAPS: ORAL_CAPSULE | ORAL | Status: DC

## 2010-11-22 MED ORDER — AMIODARONE HCL 200 MG PO TABS: 200.0000 mg | ORAL_TABLET | Freq: Every day | ORAL | Status: DC

## 2010-11-22 NOTE — Telephone Encounter (Signed)
Faxed.   KP 

## 2010-11-22 NOTE — Telephone Encounter (Signed)
Refilled meds per fax request. Has been using Amiodorone and Duragesic

## 2010-11-23 ENCOUNTER — Other Ambulatory Visit: Payer: Self-pay | Admitting: Family Medicine

## 2010-11-23 MED ORDER — TEMAZEPAM 15 MG PO CAPS: ORAL_CAPSULE | ORAL | Status: DC

## 2010-11-23 NOTE — Telephone Encounter (Signed)
Rx corrected     KP 

## 2010-11-24 ENCOUNTER — Other Ambulatory Visit: Payer: Self-pay

## 2010-11-24 MED ORDER — TEMAZEPAM 15 MG PO CAPS: ORAL_CAPSULE | ORAL | Status: DC

## 2010-11-24 NOTE — Telephone Encounter (Signed)
Refaxed   KP 

## 2010-11-27 ENCOUNTER — Other Ambulatory Visit: Payer: Self-pay | Admitting: *Deleted

## 2010-11-27 MED ORDER — CARVEDILOL 6.25 MG PO TABS: 6.2500 mg | ORAL_TABLET | Freq: Two times a day (BID) | ORAL | Status: DC

## 2010-11-27 NOTE — Discharge Summary (Signed)
NAMEASTRID, VIDES               ACCOUNT NO.:  [id number]  MEDICAL RECORD NO.:  [id number]  LOCATION:  1314                         FACILITY:  [facility]  PHYSICIAN:  Erick Blinks, MD         DATE OF BIRTH:  [birth year]  DATE OF ADMISSION:  10/20/2010 DATE OF DISCHARGE:  10/26/2010                              DISCHARGE SUMMARY   ADMITTING DIAGNOSES: 68. 65 year old female with chronic low-grade small bowel obstruction     due to adhesive disease presenting with acute illness with     diarrhea, nausea, vomiting, and possible recurrent acute     obstruction.  Rule out infectious etiology, rule out higher grade     small bowel obstruction. 2. Failure to thrive, acute on chronic, on chronic home total nutrient     admixture. 3. Chronic anemia. 4. Gastroesophageal reflux disease. 5. Coronary artery disease. 6. Left bundle branch block. 7. Superior vena cava thrombosis, post pacemaker placement. 8. Fibromyalgia. 9. Diverticulosis. 10.Chronic obstructive pulmonary disease. 11.B12 deficiency. 12.Gastrocutaneous fistula. 13.Restless legs syndrome. 14.Primary dilated cardiomyopathy. 15.Status post multiple abdominal surgeries with lysis of adhesions     and percutaneous endoscopic gastrostomy placement for     decompression, subsequently removed and complicated by     gastrocutaneous fistula.  DISCHARGE DIAGNOSES: 1. Resolved diarrhea, nausea, and vomiting with x-ray findings     consistent with persistent low-grade bowel obstruction,     symptomatically improved. 2. Failure to thrive, acute on chronic, on chronic home total nutrient     admixture. 3. Chronic anemia. 4. Gastroesophageal reflux disease. 5. Coronary artery disease. 6. Left bundle branch block. 7. Superior vena cava thrombosis, post pacemaker placement. 8. Fibromyalgia. 9. Diverticulosis. 10.Chronic obstructive pulmonary disease. 11.B12 deficiency. 12.Gastrocutaneous fistula. 13.Restless legs syndrome. 14.Primary  dilated cardiomyopathy. 15.Status post multiple abdominal surgeries with lysis of adhesions     and percutaneous endoscopic gastrostomy placement for     decompression, subsequently removed and complicated by     gastrocutaneous fistula..  CONSULTATIONS:  None.  BRIEF HISTORY:  [name] is a pleasant 65 year old white female well known to Dr. Hedwig Morton. Brodie with history as outlined above.  She does suffer from a chronic low-grade small bowel obstruction due to dense adhesive disease and has had problems with recurrent obstructions over the past few years.  She does have a gastrocutaneous fistula currently as a result of a PEG placement for venting purposes which did not heal after the PEG was removed several months ago.  At this point, she has been on bowel rest at home with sips of clear liquids and TNA cyclic for 12 hours per day.  The patient last had exploratory laparotomy with lysis of adhesions and a PEG tube placement in December 2011.  At this time, she presented with 1-week history of nausea, vomiting, abdominal discomfort, and diarrhea.  She was felt to be acutely ill and dehydrated in the office and is admitted for further diagnostic workup.  LABORATORY STUDIES ON ADMISSION:  On October 20, 2010, WBC of 11.4, hemoglobin 9.5, hematocrit of 29.8, MCV of 83, and platelets 350. Followup on October 23, 2010, WBC of 7.2, hemoglobin 9.1, hematocrit of 28.7, MCV of 84.9,  and platelets 338.  On admission, sodium 138, potassium 3.7, glucose 109, BUN 14, and creatinine 0.6.  On October 26, 2010, electrolytes within normal limits.  Cholesterol 112, triglycerides 71.  Serum iron 19, TIBC of 169, iron sat is 11, B12 was greater than 2000.  Prealbumin of 13.  C. difficile PCR negative on October 20, 2010. X-ray studies, abdominal films on October 22, 2010, showed an NG tube placed in the tip of the gastric body, pacemaker in place, decrease in small bowel obstructive pattern.  Followup on October 23, 2010, nonspecific bowel gas pattern, scattered air fluid levels, and on October 24, 2010, no significant change with gas throughout the nondistended large and small bowel.  HOSPITAL COURSE:  The patient was admitted to the service of Dr. [name].  She was initially kept n.p.o. and an NG tube was placed for decompression.  She was continued on her home TPN initially at 24-hours per day and then converted back to cyclic.  She was also placed on IV fluids and given IV antiemetics.  Abdominal films and baseline labs were obtained.  She was noted on abdominal films to have air-fluid levels. NG was continued for the first 48 hours of hospitalization and she gradually began to feel a bit better.  She continued having mild diarrhea.  By October 22, 2010, she had had no further nausea and vomiting, had been passing flatus, but had not had any further stools. We began clamping her NG tube and started her on sips of clears which she tolerated, and by October 23, 2010, no further diarrhea, in fact no bowel movements.  Abdominal films were repeated and showed some decrease in her obstructive pattern, however, she had increased nausea, so the NG was left in.  By October 24, 2010, again she was better and had tolerated clamping of the NG overnight.  The NG was then discontinued.  She was started on clear liquids and encouraged to ambulate, again the diarrhea had resolved.  She was seen in consult by Nutrition with a goal of being able to tolerate clear liquid diet with berry  resource twice daily in addition and then TNA 12-hours per day at night.  By October 25, 2010, we were able to let her try some full liquids which she did tolerate, and on October 26, 2010, complained of some headache, but otherwise felt fairly well.  She was allowed discharge to home with instructions to follow up with Dr. [name] on November 03, 2010, at 3:30 p.m., to call for any problems in the interim.  She was to continue cyclic  TNA via advanced home care 12 hours per day, and to continue a clear to very soupy full liquid diet with resource once to twice daily.  All of her medications were the same as on admission with the addition of adding Phenergan 25 mg q.6 hours as needed for nausea and vomiting which she felt was more beneficial than Zofran.  CONDITION ON DISCHARGE:  Stable and improved.     Becky Gip, PA-C   ______________________________ Erick Blinks, MD    AE/MEDQ  D:  11/21/2010  T:  11/21/2010  Job:  [id number]  Electronically Signed by AMY ESTERWOOD PA-C on 11/23/2010 04:56:14 PM Electronically Signed by Erick Blinks MD on 11/27/2010 09:34:10 AM

## 2010-11-27 NOTE — Telephone Encounter (Signed)
escribe medication per fax request  

## 2010-12-11 ENCOUNTER — Other Ambulatory Visit: Payer: Self-pay | Admitting: Internal Medicine

## 2010-12-11 ENCOUNTER — Telehealth: Payer: Self-pay | Admitting: Pulmonary Disease

## 2010-12-11 NOTE — Telephone Encounter (Signed)
Spoke with pt. She states that she is starting to have a croupy cough x several days and her WBC count elevated. Wants to see VS. I offered ov with TP today, as VS first opening on Wed. She states only wants to see VS, so sched her for Wed 9/26 at 3:30 and advised seek emergency care in the meantime if needed. Pt verbalized understanding.

## 2010-12-13 ENCOUNTER — Encounter: Payer: Self-pay | Admitting: Pulmonary Disease

## 2010-12-13 ENCOUNTER — Encounter: Payer: Self-pay | Admitting: Internal Medicine

## 2010-12-13 ENCOUNTER — Ambulatory Visit (INDEPENDENT_AMBULATORY_CARE_PROVIDER_SITE_OTHER)
Admission: RE | Admit: 2010-12-13 | Discharge: 2010-12-13 | Disposition: A | Discharge status: Home / Self Care | Payer: Medicare Other | Source: Ambulatory Visit | Attending: Pulmonary Disease | Admitting: Pulmonary Disease

## 2010-12-13 ENCOUNTER — Ambulatory Visit (INDEPENDENT_AMBULATORY_CARE_PROVIDER_SITE_OTHER): Payer: Medicare Other | Admitting: Pulmonary Disease

## 2010-12-13 VITALS — BP 102/64 | HR 85 | Temp 97.9°F | Ht 62.0 in | Wt 102.0 lb

## 2010-12-13 DIAGNOSIS — J449 Chronic obstructive pulmonary disease, unspecified: Secondary | ICD-10-CM

## 2010-12-13 DIAGNOSIS — J9610 Chronic respiratory failure, unspecified whether with hypoxia or hypercapnia: Secondary | ICD-10-CM

## 2010-12-13 DIAGNOSIS — J189 Pneumonia, unspecified organism: Secondary | ICD-10-CM

## 2010-12-13 DIAGNOSIS — J9611 Chronic respiratory failure with hypoxia: Secondary | ICD-10-CM

## 2010-12-13 LAB — BASIC METABOLIC PANEL
BUN: 11
BUN: 12
BUN: 13
BUN: 17
BUN: 25 — ABNORMAL HIGH
CO2: 21
CO2: 23
CO2: 31
CO2: 33 — ABNORMAL HIGH
CO2: 33 — ABNORMAL HIGH
Calcium: 7 — ABNORMAL LOW
Calcium: 7.6 — ABNORMAL LOW
Calcium: 7.9 — ABNORMAL LOW
Calcium: 8.3 — ABNORMAL LOW
Calcium: 8.3 — ABNORMAL LOW
Chloride: 110
Chloride: 112
Chloride: 95 — ABNORMAL LOW
Chloride: 97
Chloride: 99
Creatinine, Ser: 0.71
Creatinine, Ser: 0.74
Creatinine, Ser: 0.74
Creatinine, Ser: 0.79
Creatinine, Ser: 0.89
GFR calc Af Amer: >60
GFR calc Af Amer: >60
GFR calc Af Amer: >60
GFR calc Af Amer: >60
GFR calc Af Amer: >60
GFR calc non Af Amer: >60
GFR calc non Af Amer: >60
GFR calc non Af Amer: >60
GFR calc non Af Amer: >60
GFR calc non Af Amer: >60
Glucose, Bld: 100 — ABNORMAL HIGH
Glucose, Bld: 114 — ABNORMAL HIGH
Glucose, Bld: 50 — ABNORMAL LOW
Glucose, Bld: 79
Glucose, Bld: 83
Potassium: 3.8
Potassium: 3.9
Potassium: 4.3
Potassium: 4.5
Potassium: 4.8
Sodium: 135
Sodium: 135
Sodium: 137
Sodium: 140
Sodium: 141

## 2010-12-13 LAB — CBC
HCT: 27 — ABNORMAL LOW
HCT: 27.2 — ABNORMAL LOW
HCT: 28 — ABNORMAL LOW
HCT: 28.8 — ABNORMAL LOW
HCT: 30.2 — ABNORMAL LOW
HCT: 30.7 — ABNORMAL LOW
HCT: 38
Hemoglobin: 10 — ABNORMAL LOW
Hemoglobin: 10.3 — ABNORMAL LOW
Hemoglobin: 12.8
Hemoglobin: 9.1 — ABNORMAL LOW
Hemoglobin: 9.1 — ABNORMAL LOW
Hemoglobin: 9.3 — ABNORMAL LOW
Hemoglobin: 9.8 — ABNORMAL LOW
MCHC: 32.8
MCHC: 33.3
MCHC: 33.6
MCHC: 33.7
MCHC: 33.7
MCHC: 34
MCHC: 34.1
MCV: 86.8
MCV: 87.2
MCV: 87.5
MCV: 88.5
MCV: 88.5
MCV: 88.8
MCV: 88.9
Platelets: 171
Platelets: 184
Platelets: 186
Platelets: 213
Platelets: 261
Platelets: 370
Platelets: 446 — ABNORMAL HIGH
RBC: 3.07 — ABNORMAL LOW
RBC: 3.09 — ABNORMAL LOW
RBC: 3.15 — ABNORMAL LOW
RBC: 3.29 — ABNORMAL LOW
RBC: 3.47 — ABNORMAL LOW
RBC: 3.48 — ABNORMAL LOW
RBC: 4.29
RDW: 12.6
RDW: 13.1
RDW: 13.1
RDW: 13.3
RDW: 13.4
RDW: 13.6
RDW: 13.8
WBC: 10.6 — ABNORMAL HIGH
WBC: 10.7 — ABNORMAL HIGH
WBC: 11.8 — ABNORMAL HIGH
WBC: 12.3 — ABNORMAL HIGH
WBC: 13.7 — ABNORMAL HIGH
WBC: 6.8
WBC: 9

## 2010-12-13 LAB — PROTEIN, BODY FLUID: Total protein, fluid: <3

## 2010-12-13 LAB — BODY FLUID CULTURE
Culture: NO GROWTH
Gram Stain: NONE SEEN

## 2010-12-13 LAB — B-NATRIURETIC PEPTIDE (CONVERTED LAB): Pro B Natriuretic peptide (BNP): 245 — ABNORMAL HIGH

## 2010-12-13 LAB — COMPREHENSIVE METABOLIC PANEL
ALT: 17
AST: 36
Albumin: 3.1 — ABNORMAL LOW
Alkaline Phosphatase: 59
BUN: 33 — ABNORMAL HIGH
CO2: 25
Calcium: 8.1 — ABNORMAL LOW
Chloride: 107
Creatinine, Ser: 0.92
GFR calc Af Amer: >60
GFR calc non Af Amer: >60
Glucose, Bld: 42 — ABNORMAL LOW
Potassium: 4.7
Sodium: 140
Total Bilirubin: 0.9
Total Protein: 5.9 — ABNORMAL LOW

## 2010-12-13 LAB — BODY FLUID CELL COUNT WITH DIFFERENTIAL
Lymphs, Fluid: 4
Monocyte-Macrophage-Serous Fluid: 17 — ABNORMAL LOW
Neutrophil Count, Fluid: 79 — ABNORMAL HIGH
Total Nucleated Cell Count, Fluid: 11270 — ABNORMAL HIGH

## 2010-12-13 LAB — URINE CULTURE: Colony Count: >=100000

## 2010-12-13 LAB — HEMOGLOBIN A1C
Hgb A1c MFr Bld: 5.6
Mean Plasma Glucose: 122

## 2010-12-13 LAB — URINALYSIS, ROUTINE W REFLEX MICROSCOPIC
Bilirubin Urine: NEGATIVE
Glucose, UA: NEGATIVE
Nitrite: NEGATIVE
Protein, ur: 30 — AB
Specific Gravity, Urine: 1.013
Urobilinogen, UA: 1
pH: 8

## 2010-12-13 LAB — DIFFERENTIAL
Basophils Absolute: 0
Basophils Absolute: 0
Basophils Relative: 0
Basophils Relative: 0
Eosinophils Absolute: 0
Eosinophils Absolute: 0.2
Eosinophils Relative: 0
Eosinophils Relative: 2
Lymphocytes Relative: 2 — ABNORMAL LOW
Lymphocytes Relative: 7 — ABNORMAL LOW
Lymphs Abs: 0.3 — ABNORMAL LOW
Lymphs Abs: 0.8
Monocytes Absolute: 0.5
Monocytes Absolute: 0.7
Monocytes Relative: 4
Monocytes Relative: 6
Neutro Abs: 10.1 — ABNORMAL HIGH
Neutro Abs: 12.9 — ABNORMAL HIGH
Neutrophils Relative %: 85 — ABNORMAL HIGH
Neutrophils Relative %: 94 — ABNORMAL HIGH

## 2010-12-13 LAB — OCCULT BLOOD X 1 CARD TO LAB, STOOL
Fecal Occult Bld: NEGATIVE
Fecal Occult Bld: NEGATIVE
Fecal Occult Bld: POSITIVE

## 2010-12-13 LAB — LACTATE DEHYDROGENASE, PLEURAL OR PERITONEAL FLUID: LD, Fluid: 389 — ABNORMAL HIGH

## 2010-12-13 LAB — LIPASE, BLOOD: Lipase: 14

## 2010-12-13 LAB — SEDIMENTATION RATE: Sed Rate: 94 — ABNORMAL HIGH

## 2010-12-13 LAB — URINE MICROSCOPIC-ADD ON

## 2010-12-13 LAB — PATHOLOGIST SMEAR REVIEW

## 2010-12-13 MED ORDER — LEVOFLOXACIN 500 MG PO TABS: 500.0000 mg | ORAL_TABLET | Freq: Every day | ORAL | Status: AC

## 2010-12-13 NOTE — Assessment & Plan Note (Signed)
She has increased cough, sputum, and dyspnea.  Will give course of levaquin and repeat chest xray today.

## 2010-12-13 NOTE — Progress Notes (Signed)
Subjective:    Patient ID: [name], female    DOB: 1946-02-17, 65 y.o.   MRN: [id number]  HPI CC: [name]  65 yo female with history of COPD/emphysema, chronic hypoxemic respiratory failure, and recurrent pneumonia.  She has more cough for one week.  She has increased WBC from recent lab test.  She is not having fever.  She is not sure what her sputum looks like.  She has rattling in her chest.    Past Medical History  Diagnosis Date  . Small bowel obstruction   . Anemia   . HTN (hypertension)   . Osteoarthritis   . GERD (gastroesophageal reflux disease)   . CAD (coronary artery disease)     mild disease per cath in 2008  . LBBB (left bundle branch block)   . Atrial fibrillation     on amiodarone  . 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 Fibromyalgia   . Diverticulosis   . COPD (chronic obstructive pulmonary disease)     -06/28/08 PFT FEV1 1.95 (97%), FVC 2.79 (101%), FEV1% 70, TLC 3.88 (87%), DLCO 64%  . Pneumonia - 03/09, 12/09, 02/10, 06/10  . Pleural effusion      s/p right thoracentesis 03/09  . Small bowel obstruction   . DDD (degenerative disc disease)   . Urinary incontinence     has pacemaker stimulator in her bladder  . RLS (restless legs syndrome)   . Primary dilated cardiomyopathy     EF 45 to 50% per echo in Jan 2012  . Vitamin B12 deficiency   . Gastroparesis   . Chronic nausea   . Chronic abdominal pain   . Chronic pain syndrome   . Gastrocutaneous fistula   . Pacemaker     CRT therapy; followed by Dr. Graciela Husbands  . H/O: GI bleed     from Pradaxa  . Oxygen dependent     Family History  Problem Relation Age of Onset  . Heart disease Father   . Stroke Mother   . Colon cancer Neg Hx     History   Social History  . Marital Status: Married    Number of Children: 5   Occupational History  . disabled    Social History Main Topics  . Smoking status: Former Smoker -- 1.0 packs/day for 20 years    Types:  Cigarettes    Quit date: 03/19/1986  . Smokeless tobacco: Never Used  . Alcohol Use: No  . Drug Use: No   Social History Narrative   Occupation: DisabledAlcohol Use - noIllicit Drug Use - noPatient is a former smoker, quit 1988 x11yrs, <1ppd. Daily Caffeine Use    Allergies  Allergen Reactions  . Pentazocine Lactate   . Pradaxa   . Sulfonamide Derivatives   . Talwin     Outpatient Prescriptions Prior to Visit  Medication Sig Dispense Refill  . amiodarone (PACERONE) 200 MG tablet Take 1 tablet (200 mg total) by mouth daily.  30 tablet  5  . carvedilol (COREG) 6.25 MG tablet Take 1 tablet (6.25 mg total) by mouth 2 (two) times daily with a meal.  60 tablet  4  . cyclobenzaprine (FLEXERIL) 10 MG tablet Take 1 tablet (10 mg total) by mouth 3 (three) times daily as needed.  60 tablet  2  . denosumab (PROLIA) 60 MG/ML SOLN Inject 60 mg into the skin. Twice a year      . fentaNYL (DURAGESIC -  DOSED MCG/HR) 50 MCG/HR Place 1 patch (50 mcg total) onto the skin every 3 (three) days.  10 patch  0  . gabapentin (NEURONTIN) 600 MG tablet TAKE 1 TABLET 4 TIMES DAILY  120 tablet  5  . meloxicam (MOBIC) 15 MG tablet TAKE 1/2 TO 1 TABLET DAILY AS NEEDED  30 tablet  0  . nitroGLYCERIN (NITROSTAT) 0.4 MG SL tablet Place 0.4 mg under the tongue every 5 (five) minutes as needed.        . Nutritional Supplements (RESOURCE BREEZE PO) 2 (two) times daily.       [name] oxyCODONE-acetaminophen (ENDOCET) 10-325 MG per tablet Take 1 tablet by mouth every 4 (four) hours as needed.  120 tablet  0  . pantoprazole (PROTONIX) 40 MG tablet Take one po BID  180 tablet  0  . temazepam (RESTORIL) 15 MG capsule 1-2 tablets at bedtime as needed  60 capsule  0  . tiotropium (SPIRIVA HANDIHALER) 18 MCG inhalation capsule Place 18 mcg into inhaler and inhale daily.        . furosemide (LASIX) 20 MG tablet Take 40mg  of Furosemide daily alternating with 80mg  of Furosemide.  90 tablet  3  . AMBULATORY NON FORMULARY MEDICATION  Domperidone 10 mg Take 1 tablet by mouth three times daily before meals  240 tablet  0  . ondansetron (ZOFRAN) 8 MG tablet TAKE 1 TABLET EVERY 12 HR AS NEEDED  20 tablet  0  . sucralfate (CARAFATE) 1 GM/10ML suspension Take 10 cc po QID x 5 days  420 mL  0   Review of Systems     Objective:   Physical Exam  BP 102/64  Pulse 85  Temp(Src) 97.9 F (36.6 C) (Oral)  Ht 5\' 2"  (1.575 m)  Wt 102 lb (46.267 kg)  BMI 18.66 kg/m2  SpO2 96%  General: thin.  Nose: no deformity, discharge, inflammation, or lesions  Mouth: no deformity or lesions  Neck: no JVD.  Lungs: decreased breath sounds, scattered rhonchi b/l, no wheezing or rales, no dullness  Heart: regular rhythm, normal rate, and no murmurs.  Abd: Soft, nontender  Extremities: no edema  Neurologic: normal CN II-XII and strength normal.  Cervical Nodes: no significant adenopathy  Psych: alert and cooperative; normal mood and affect; normal attention span and concentration      Assessment & Plan:   Recurrent pneumonia She has increased cough, sputum, and dyspnea.  Will give course of levaquin and repeat chest xray today.  COPD She is to continue her current inhaler regimen.  I don't think she needs prednisone at this time.      Chronic hypoxemic respiratory failure She had oxygen desaturation on exertion.  She will need to use oxygen with exertion and sleep.        Updated Medication List Outpatient Encounter Prescriptions as of 12/13/2010  Medication Sig Dispense Refill  . amiodarone (PACERONE) 200 MG tablet Take 1 tablet (200 mg total) by mouth daily.  30 tablet  5  . carvedilol (COREG) 6.25 MG tablet Take 1 tablet (6.25 mg total) by mouth 2 (two) times daily with a meal.  60 tablet  4  . cyclobenzaprine (FLEXERIL) 10 MG tablet Take 1 tablet (10 mg total) by mouth 3 (three) times daily as needed.  60 tablet  2  . denosumab (PROLIA) 60 MG/ML SOLN Inject 60 mg into the skin. Twice a year      . fentaNYL  (DURAGESIC - DOSED MCG/HR) 50 MCG/HR Place 1 patch (  50 mcg total) onto the skin every 3 (three) days.  10 patch  0  . furosemide (LASIX) 20 MG tablet Take 20 mg by mouth daily.        [name] gabapentin (NEURONTIN) 600 MG tablet TAKE 1 TABLET 4 TIMES DAILY  120 tablet  5  . meloxicam (MOBIC) 15 MG tablet TAKE 1/2 TO 1 TABLET DAILY AS NEEDED  30 tablet  0  . metoCLOPramide (REGLAN) 5 MG tablet 1 by mouth three times daily      . nitroGLYCERIN (NITROSTAT) 0.4 MG SL tablet Place 0.4 mg under the tongue every 5 (five) minutes as needed.        . Nutritional Supplements (RESOURCE BREEZE PO) 2 (two) times daily.       [name] oxyCODONE-acetaminophen (ENDOCET) 10-325 MG per tablet Take 1 tablet by mouth every 4 (four) hours as needed.  120 tablet  0  . pantoprazole (PROTONIX) 40 MG tablet Take one po BID  180 tablet  0  . promethazine (PHENERGAN) 25 MG tablet 1 by mouth every 6 hours as needed      . temazepam (RESTORIL) 15 MG capsule 1-2 tablets at bedtime as needed  60 capsule  0  . tiotropium (SPIRIVA HANDIHALER) 18 MCG inhalation capsule Place 18 mcg into inhaler and inhale daily.        [name] DISCONTD: furosemide (LASIX) 20 MG tablet Take 40mg  of Furosemide daily alternating with 80mg  of Furosemide.  90 tablet  3  . levofloxacin (LEVAQUIN) 500 MG tablet Take 1 tablet (500 mg total) by mouth daily.  7 tablet  0  . DISCONTD: AMBULATORY NON FORMULARY MEDICATION Domperidone 10 mg Take 1 tablet by mouth three times daily before meals  240 tablet  0  . DISCONTD: ondansetron (ZOFRAN) 8 MG tablet TAKE 1 TABLET EVERY 12 HR AS NEEDED  20 tablet  0  . DISCONTD: sucralfate (CARAFATE) 1 GM/10ML suspension Take 10 cc po QID x 5 days  420 mL  0

## 2010-12-13 NOTE — Assessment & Plan Note (Signed)
She had oxygen desaturation on exertion.  She will need to use oxygen with exertion and sleep.

## 2010-12-13 NOTE — Assessment & Plan Note (Signed)
She is to continue her current inhaler regimen.  I don't think she needs prednisone at this time.

## 2010-12-13 NOTE — Patient Instructions (Signed)
Chest xray today>>will call with results Levaquin 500 mg daily for 7 days Follow up in 4 to 6 weeks

## 2010-12-14 ENCOUNTER — Other Ambulatory Visit: Payer: Self-pay | Admitting: Family Medicine

## 2010-12-14 ENCOUNTER — Telehealth: Payer: Self-pay | Admitting: Pulmonary Disease

## 2010-12-14 DIAGNOSIS — J189 Pneumonia, unspecified organism: Secondary | ICD-10-CM

## 2010-12-14 LAB — CBC
HCT: 28 — ABNORMAL LOW
Hemoglobin: 9.4 — ABNORMAL LOW
MCHC: 33.6
MCV: 87.2
Platelets: 525 — ABNORMAL HIGH
RBC: 3.21 — ABNORMAL LOW
RDW: 13.3
WBC: 9.6

## 2010-12-14 LAB — OCCULT BLOOD X 1 CARD TO LAB, STOOL: Fecal Occult Bld: POSITIVE

## 2010-12-14 NOTE — Telephone Encounter (Signed)
Last seen 08/15/10 and filled 11/09/10 please advise    KP

## 2010-12-14 NOTE — Telephone Encounter (Signed)
CXR 12/13/10>>patchy infiltrate on Rt.  D/W pt over phone.  She feels about the same as on 09/26, but no worse.  Still has cough but easy to expectorate.  Advised her to complete course of levaquin.  Advised her to call or go to hospital if symptoms get worse.

## 2010-12-15 ENCOUNTER — Ambulatory Visit (INDEPENDENT_AMBULATORY_CARE_PROVIDER_SITE_OTHER): Payer: Medicare Other | Admitting: Internal Medicine

## 2010-12-15 ENCOUNTER — Encounter: Payer: Self-pay | Admitting: Internal Medicine

## 2010-12-15 DIAGNOSIS — K56609 Unspecified intestinal obstruction, unspecified as to partial versus complete obstruction: Secondary | ICD-10-CM

## 2010-12-15 DIAGNOSIS — K316 Fistula of stomach and duodenum: Secondary | ICD-10-CM

## 2010-12-15 DIAGNOSIS — R195 Other fecal abnormalities: Secondary | ICD-10-CM

## 2010-12-15 DIAGNOSIS — D649 Anemia, unspecified: Secondary | ICD-10-CM

## 2010-12-15 LAB — POCT HEMOGLOBIN-HEMACUE: Hemoglobin: 13.2

## 2010-12-15 MED ORDER — IRON DEXTRAN 50 MG/ML IJ SOLN: INTRAMUSCULAR | Status: DC

## 2010-12-15 MED ORDER — OXYCODONE-ACETAMINOPHEN 10-325 MG PO TABS: 1.0000 | ORAL_TABLET | ORAL | Status: DC | PRN

## 2010-12-15 MED ORDER — FENTANYL 50 MCG/HR TD PT72: 1.0000 | MEDICATED_PATCH | TRANSDERMAL | Status: DC

## 2010-12-15 NOTE — Patient Instructions (Signed)
You have been scheduled for a colonoscopy. Please follow written instructions given to you at your visit today.  You have been scheduled for an appointment with Dr Dwain Sarna at [facility]. Your appointment is on 12/25/10 at 11:00 am. Please arrive at 10:45 am for registration. Make certain to bring a list of current medications, including any over the counter medications or vitamins. Also bring your co-pay if you have one as well as your insurance cards. [facility] is located at [address], [address]. Should you need to reschedule your appointment, please contact them at [phone]. You have been scheduled for an imfed infusion at [facility] on 12/20/10 @ 11 am. Please arrive at 10:30 am for registration. CC: Dr Dwain Sarna, Dr Laury Axon

## 2010-12-15 NOTE — Telephone Encounter (Signed)
Patient aware Rx ready for pick up.      KP 

## 2010-12-15 NOTE — Progress Notes (Signed)
[name] [birth year] MRN [id number]     History of Present Illness:  This is a 65 year old white female with chronic GI blood loss and chronic low-grade small bowel obstruction and gastroenteric fistula after removal of percutaneous gastrostomy. She is status post upper GI bleed while on Pradaxa. She is status post PTVP. Her bleeding was due to esophageal ulcers. She is currently on central TNA which started 2 months ago in an effort to close the gastroenteric fistula. It continues to drain a small amount of bile liquid which increases in volume when she has a significant amount of oral liquids. Her TNA runs on a daily basis from 8 PM to 8 AM. Her serum albumin has improved to a normal level of 21. She underwent an exploratory laparotomy and lysis of adhesions in December 2011. She denies abdominal pain but her level of energy has been very low. She has frequent vomiting. She has been on complete bowel rest with the exception of clear liquids. She was recently hospitalized with pneumonia and treated with Levaquin. She is on home oxygen at all times.    Past Medical History  Diagnosis Date  . Small bowel obstruction   . Anemia   . HTN (hypertension)   . Osteoarthritis   . GERD (gastroesophageal reflux disease)   . CAD (coronary artery disease)     mild disease per cath in 2008  . LBBB (left bundle branch block)   . Atrial fibrillation     on amiodarone  . 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 Fibromyalgia   . Diverticulosis   . COPD (chronic obstructive pulmonary disease)     -06/28/08 PFT FEV1 1.95 (97%), FVC 2.79 (101%), FEV1% 70, TLC 3.88 (87%), DLCO 64%  . Pneumonia - 03/09, 12/09, 02/10, 06/10  . Pleural effusion      s/p right thoracentesis 03/09  . Small bowel obstruction   . DDD (degenerative disc disease)   . Urinary incontinence     has pacemaker stimulator in her bladder  . RLS (restless legs syndrome)   . Primary dilated  cardiomyopathy     EF 45 to 50% per echo in Jan 2012  . Vitamin B12 deficiency   . Gastroparesis   . Chronic nausea   . Chronic abdominal pain   . Chronic pain syndrome   . Gastrocutaneous fistula   . Pacemaker     CRT therapy; followed by Dr. Graciela Husbands  . H/O: GI bleed     from Pradaxa  . Oxygen dependent    Past Surgical History  Procedure Date  . Colectomy     Intestinal resection (5times),S/P PARTIAL COLECTOMY 2001  . Appendectomy   . Bladder surgery   . Tonsillectomy   . Total abdominal hysterectomy   . Pacemaker placement     CRT-PPM-[employer] in October 2011  . Peg placement   . Abdominal adhesion surgery     numerous surgeries  . Peg removed     with complications  . Cardiac catheterization 2008    reports that she quit smoking about 24 years ago. Her smoking use included Cigarettes. She has a 20 pack-year smoking history. She has never used smokeless tobacco. She reports that she does not drink alcohol or use illicit drugs. family history includes Heart disease in her father and Stroke in her mother.  There is no history of Colon cancer. Allergies  Allergen Reactions  . Pentazocine Lactate   . Pradaxa   .  Sulfonamide Derivatives   . Talwin         Review of Systems: Denies dysphagia odynophagia or chest pain  The remainder of the 10 point ROS is negative except as outlined in H&P   Physical Exam: General appearance  thin chronically-appearing, in no distress, appears pale. Eyes- non icteric. HEENT nontraumatic, normocephalic. Mouth no lesions, tongue papillated, no cheilosis. Neck supple without adenopathy, thyroid not enlarged, no carotid bruits, no JVD. Lungs Clear to auscultation bilaterally. Frequent cough with expectoration. Cor normal S1, normal S2, regular rhythm, no murmur,  quiet precordium. Abdomen: Soft nontender abdomen with normoactive bowel sounds. Gastrostomy site shows focal erythema around the opening. Small amount of yellowish drainage  and retained suture at the gastrostomy site. Rectal: Small amount of Hemoccult positive stool. Extremities no pedal edema. Skin no lesions. Neurological alert and oriented x 3. Psychological normal mood and affect.  Assessment and Plan:  Problem #1 ongoing partial small bowel obstruction due to adhesive disease. The bowel rest has been effective in that the drainage from the gastroenteric fistula has decreased and also her nocturnal aspiration has decreased, although she still vomits at times. I suspect that her pneumonia might have been caused by aspiration.  Problem #2 Malnutrition. Her prealbumin level is up to normal now. We will plan to continue for another month then have her reassessed by Dr. Dwain Sarna for closure of the g-e fistula.  Problem #3 chronic anemia and chronic Hemoccult-positive stool. Her last full colonoscopy was in 2000 prior to subtotal colectomy. Her most recent flexible sigmoidoscopy in 2010 showed a normal appearing sigmoid colon with moderate diverticulosis. The anastomosis was not visualized. A barium enema in 2008 showed a subtotal colectomy.  Plan #1 schedule iron infusion. Plan #2 next H&H will be drawn next week. If it less than 9, we will plan to transfuse 2 units of packed cells. Plan #3 Schedule for flexible sigmoidoscopy to look for the site of bleeding. Plan #4 TNA.  Patient to see Dr. Dwain Sarna first week in November 2012.   12/15/2010 [name]

## 2010-12-18 ENCOUNTER — Encounter: Payer: Self-pay | Admitting: Internal Medicine

## 2010-12-18 ENCOUNTER — Ambulatory Visit (AMBULATORY_SURGERY_CENTER): Payer: Medicare Other | Admitting: Internal Medicine

## 2010-12-18 VITALS — BP 99/59 | HR 81 | Temp 98.8°F | Resp 16 | Ht 62.0 in | Wt 99.0 lb

## 2010-12-18 DIAGNOSIS — D649 Anemia, unspecified: Secondary | ICD-10-CM

## 2010-12-18 DIAGNOSIS — K5190 Ulcerative colitis, unspecified, without complications: Secondary | ICD-10-CM

## 2010-12-18 DIAGNOSIS — K633 Ulcer of intestine: Secondary | ICD-10-CM

## 2010-12-18 MED ORDER — SODIUM CHLORIDE 0.9 % IV SOLN: 500.0000 mL | INTRAVENOUS | Status: DC

## 2010-12-18 NOTE — Progress Notes (Signed)
PT. B/P LOW DR. Juanda Chance AWARE. CONTINUOUS ASSESSING.I.V. FLUIDS ADMINISTERED CONTINOUSLY AND DR. Juanda Chance MADE AWARE OF PT. STATUS.1031 PT. AWAKE ASKING FOR MEDICATION EXPLAINED TO HER THAT B/P LOW MEDICATION IS TITRATED ACCORDING TO STATUS.

## 2010-12-18 NOTE — Patient Instructions (Signed)
Discharge instructions given with verbal understanding. Biopsies taken. Resume previous medications. 

## 2010-12-19 ENCOUNTER — Telehealth: Payer: Self-pay | Admitting: *Deleted

## 2010-12-19 ENCOUNTER — Other Ambulatory Visit: Payer: Self-pay | Admitting: Family Medicine

## 2010-12-19 DIAGNOSIS — L98499 Non-pressure chronic ulcer of skin of other sites with unspecified severity: Secondary | ICD-10-CM

## 2010-12-19 DIAGNOSIS — K56609 Unspecified intestinal obstruction, unspecified as to partial versus complete obstruction: Secondary | ICD-10-CM

## 2010-12-19 DIAGNOSIS — R197 Diarrhea, unspecified: Secondary | ICD-10-CM

## 2010-12-19 DIAGNOSIS — D509 Iron deficiency anemia, unspecified: Secondary | ICD-10-CM

## 2010-12-19 MED ORDER — TEMAZEPAM 15 MG PO CAPS: ORAL_CAPSULE | ORAL | Status: DC

## 2010-12-19 MED ORDER — IRON DEXTRAN 50 MG/ML IJ SOLN: INTRAMUSCULAR | Status: DC

## 2010-12-19 NOTE — Telephone Encounter (Signed)
Patient is going to have the test done next week. She will come up to the 3rd floor and sign financial waiver then have labs drawn.

## 2010-12-19 NOTE — Telephone Encounter (Signed)
[facility] short stay called and needs orders for IV iron for tomorrow. Refaxed orders.

## 2010-12-19 NOTE — Telephone Encounter (Signed)
VOICEMAIL NO I.D. NO MESSAGE LEFT. 

## 2010-12-19 NOTE — Telephone Encounter (Signed)
Temazepam request [last refill 11/24/10 #[id number]]

## 2010-12-19 NOTE — Telephone Encounter (Signed)
Ok to refill 

## 2010-12-19 NOTE — Telephone Encounter (Signed)
Calling patient to discuss IBD markers. Discussed what the test is and the cost that may not be covered by insurance. She wishes to check with her insurance and let me know.

## 2010-12-19 NOTE — Telephone Encounter (Signed)
Faxed.   KP 

## 2010-12-20 ENCOUNTER — Telehealth: Payer: Self-pay | Admitting: Internal Medicine

## 2010-12-20 ENCOUNTER — Encounter: Payer: Self-pay | Admitting: Internal Medicine

## 2010-12-20 ENCOUNTER — Encounter (HOSPITAL_COMMUNITY): Payer: Medicare Other | Attending: Internal Medicine

## 2010-12-20 DIAGNOSIS — Z9981 Dependence on supplemental oxygen: Secondary | ICD-10-CM | POA: Insufficient documentation

## 2010-12-20 DIAGNOSIS — IMO0001 Reserved for inherently not codable concepts without codable children: Secondary | ICD-10-CM | POA: Insufficient documentation

## 2010-12-20 DIAGNOSIS — M1990 Unspecified osteoarthritis, unspecified site: Secondary | ICD-10-CM | POA: Insufficient documentation

## 2010-12-20 DIAGNOSIS — I2510 Atherosclerotic heart disease of native coronary artery without angina pectoris: Secondary | ICD-10-CM | POA: Insufficient documentation

## 2010-12-20 DIAGNOSIS — I10 Essential (primary) hypertension: Secondary | ICD-10-CM | POA: Insufficient documentation

## 2010-12-20 DIAGNOSIS — K5730 Diverticulosis of large intestine without perforation or abscess without bleeding: Secondary | ICD-10-CM | POA: Insufficient documentation

## 2010-12-20 DIAGNOSIS — K219 Gastro-esophageal reflux disease without esophagitis: Secondary | ICD-10-CM | POA: Insufficient documentation

## 2010-12-20 DIAGNOSIS — J4489 Other specified chronic obstructive pulmonary disease: Secondary | ICD-10-CM | POA: Insufficient documentation

## 2010-12-20 DIAGNOSIS — Z79899 Other long term (current) drug therapy: Secondary | ICD-10-CM | POA: Insufficient documentation

## 2010-12-20 DIAGNOSIS — J449 Chronic obstructive pulmonary disease, unspecified: Secondary | ICD-10-CM | POA: Insufficient documentation

## 2010-12-20 DIAGNOSIS — D649 Anemia, unspecified: Secondary | ICD-10-CM | POA: Insufficient documentation

## 2010-12-20 NOTE — Telephone Encounter (Signed)
Called by clinical lab with STAT results. Nothing critical.  Labs ordered by Dr. Juanda Chance today. Na 140, K 4.1, Crt 0.66 AST 141, ALT 87, TBILI 0.3, Alk Phos 328 TP 5.4, Alb 2.1, Ca 7.7 Phos, Mg normal WBC 6.5, Hbg 8.9, HCT 28, Plt 320 (N 83%, L 7%)  I did not contact patient by phone with these results as nothing critical tonight.  Office can contact patient in the am.

## 2010-12-21 ENCOUNTER — Encounter: Payer: Self-pay | Admitting: Internal Medicine

## 2010-12-21 NOTE — Telephone Encounter (Signed)
Please go ahead and schedule transfusion 2 u of PRBC's since her Hgb < 9.0.

## 2010-12-21 NOTE — Telephone Encounter (Signed)
Spoke with patient gave her results and the date and time of transfusion at [facility].

## 2010-12-21 NOTE — Telephone Encounter (Signed)
Scheduled patient for 2 units of PRBC at [facility] hospital on 12/25/10 at 8:00 AM.([name])

## 2010-12-21 NOTE — Telephone Encounter (Signed)
Orders faxed to [facility] short stay

## 2010-12-22 ENCOUNTER — Ambulatory Visit (INDEPENDENT_AMBULATORY_CARE_PROVIDER_SITE_OTHER): Payer: Medicare Other | Admitting: Internal Medicine

## 2010-12-22 DIAGNOSIS — E538 Deficiency of other specified B group vitamins: Secondary | ICD-10-CM

## 2010-12-22 MED ORDER — CYANOCOBALAMIN 1000 MCG/ML IJ SOLN
1000.0000 ug | INTRAMUSCULAR | Status: DC
  Administered 2010-12-22 – 2011-09-03 (×9): 1000 ug via INTRAMUSCULAR

## 2010-12-25 ENCOUNTER — Encounter (INDEPENDENT_AMBULATORY_CARE_PROVIDER_SITE_OTHER): Payer: Self-pay | Admitting: General Surgery

## 2010-12-25 ENCOUNTER — Ambulatory Visit (HOSPITAL_COMMUNITY): Payer: Medicare Other | Attending: Internal Medicine

## 2010-12-25 ENCOUNTER — Other Ambulatory Visit: Payer: Self-pay | Admitting: Internal Medicine

## 2010-12-25 DIAGNOSIS — D509 Iron deficiency anemia, unspecified: Secondary | ICD-10-CM | POA: Insufficient documentation

## 2010-12-26 LAB — URINALYSIS, MICROSCOPIC ONLY
Bilirubin Urine: NEGATIVE
Glucose, UA: NEGATIVE
Hgb urine dipstick: NEGATIVE
Ketones, ur: NEGATIVE
Nitrite: NEGATIVE
Protein, ur: NEGATIVE
Specific Gravity, Urine: 1.012
Urobilinogen, UA: 0.2
pH: 6

## 2010-12-26 LAB — BASIC METABOLIC PANEL
BUN: 17
CO2: 30
Calcium: 8.3 — ABNORMAL LOW
Chloride: 102
Creatinine, Ser: 0.83
GFR calc Af Amer: >60
GFR calc non Af Amer: >60
Glucose, Bld: 84
Potassium: 3.9
Sodium: 138

## 2010-12-26 LAB — CROSSMATCH
ABO/RH(D): A NEG
Antibody Screen: NEGATIVE
Unit division: 0
Unit division: 0

## 2010-12-26 LAB — CARDIAC PANEL(CRET KIN+CKTOT+MB+TROPI)
CK, MB: 1.8
CK, MB: 2
Relative Index: INVALID
Relative Index: INVALID
Total CK: 45
Total CK: 48
Troponin I: 0.01
Troponin I: 0.03

## 2010-12-26 LAB — CBC
HCT: 34.3 — ABNORMAL LOW
Hemoglobin: 11.7 — ABNORMAL LOW
MCHC: 33.9
MCV: 89.8
Platelets: 301
RBC: 3.83 — ABNORMAL LOW
RDW: 13.8
WBC: 6.8

## 2010-12-26 LAB — DIFFERENTIAL
Basophils Absolute: 0
Basophils Relative: 0
Eosinophils Absolute: 0.1 — ABNORMAL LOW
Eosinophils Relative: 1
Lymphocytes Relative: 11 — ABNORMAL LOW
Lymphs Abs: 0.7
Monocytes Absolute: 0.5
Monocytes Relative: 7
Neutro Abs: 5.5
Neutrophils Relative %: 81 — ABNORMAL HIGH

## 2010-12-26 LAB — TROPONIN I: Troponin I: 0.01

## 2010-12-26 LAB — CK TOTAL AND CKMB (NOT AT ARMC)
CK, MB: 4
Relative Index: 3.6 — ABNORMAL HIGH
Total CK: 111

## 2010-12-26 LAB — ALBUMIN: Albumin: 3.1 — ABNORMAL LOW

## 2010-12-28 ENCOUNTER — Encounter: Payer: Self-pay | Admitting: Internal Medicine

## 2010-12-29 ENCOUNTER — Telehealth: Payer: Self-pay | Admitting: *Deleted

## 2010-12-29 NOTE — Telephone Encounter (Signed)
Called and left a message for patient to call me and let me know if she is going to have IBD markers drawn.

## 2010-12-29 NOTE — Telephone Encounter (Signed)
Message copied by Daphine Deutscher on Fri Dec 29, 2010  9:30 AM ------      Message from: Daphine Deutscher      Created: Tue Dec 19, 2010 10:41 AM       Did patient have IBD markers drawn for DB

## 2011-01-01 NOTE — Telephone Encounter (Signed)
Spoke with patient and she is very undecided about having the IBD marker drawn because of the cost. She wants to know if it will make a difference if she does not have them drawn. Please, advise.

## 2011-01-02 ENCOUNTER — Telehealth: Payer: Self-pay | Admitting: *Deleted

## 2011-01-02 NOTE — Telephone Encounter (Signed)
If the markers are positive , I would definitely want to put her on a steroid taper, if they are negative, I may still do that but I will have to be more careful  Because of uncertainly whether is is going to help.

## 2011-01-02 NOTE — Telephone Encounter (Signed)
Patient calling in because she has received a bill from [facility]. Her TPN will cost her $400/week. She does not feel she can afford this and wants to know if she can stop the TPN. Please, advise.

## 2011-01-02 NOTE — Telephone Encounter (Signed)
Yes, she will have to stop her TNA. [facility] can taper it. Can we appeal it??

## 2011-01-03 ENCOUNTER — Encounter: Payer: Self-pay | Admitting: Internal Medicine

## 2011-01-03 NOTE — Telephone Encounter (Signed)
Spoke with Elnita Maxwell at [facility] and she is checking in to  Patient accounts re: costs.

## 2011-01-03 NOTE — Telephone Encounter (Signed)
She still wants to think about the blood test.

## 2011-01-04 ENCOUNTER — Encounter: Payer: Self-pay | Admitting: *Deleted

## 2011-01-04 NOTE — Telephone Encounter (Signed)
Spoke with patient and she will continue TPN until the first of the year since she has met her out of pocket for the year. Patient wants to know when she should come back for an OV.  Last OV 12/15/10.Please, advise.

## 2011-01-04 NOTE — Telephone Encounter (Signed)
Received a call from Garland with [facility]. She states she has talked with patient's insurance and patient about her TPN. Her TPN is considered DME but she has reached her out of pocket for the year so it is covered 100 %.

## 2011-01-04 NOTE — Telephone Encounter (Signed)
I should see her every 8 weeks.

## 2011-01-04 NOTE — Telephone Encounter (Signed)
Scheduled patient on 02/02/11 at 1:45 PM. Left a message for patient with appointment and mailed her a letter with appointment.

## 2011-01-08 ENCOUNTER — Other Ambulatory Visit: Payer: Self-pay | Admitting: Family Medicine

## 2011-01-08 ENCOUNTER — Ambulatory Visit (INDEPENDENT_AMBULATORY_CARE_PROVIDER_SITE_OTHER)
Admission: RE | Admit: 2011-01-08 | Discharge: 2011-01-08 | Disposition: A | Discharge status: Home / Self Care | Payer: Medicare Other | Source: Ambulatory Visit | Attending: Pulmonary Disease | Admitting: Pulmonary Disease

## 2011-01-08 ENCOUNTER — Encounter: Payer: Self-pay | Admitting: Pulmonary Disease

## 2011-01-08 ENCOUNTER — Ambulatory Visit (INDEPENDENT_AMBULATORY_CARE_PROVIDER_SITE_OTHER): Payer: Medicare Other | Admitting: Pulmonary Disease

## 2011-01-08 VITALS — BP 128/72 | HR 86 | Temp 98.0°F | Ht 62.0 in | Wt 97.4 lb

## 2011-01-08 DIAGNOSIS — J9610 Chronic respiratory failure, unspecified whether with hypoxia or hypercapnia: Secondary | ICD-10-CM

## 2011-01-08 DIAGNOSIS — J9611 Chronic respiratory failure with hypoxia: Secondary | ICD-10-CM

## 2011-01-08 DIAGNOSIS — J189 Pneumonia, unspecified organism: Secondary | ICD-10-CM

## 2011-01-08 DIAGNOSIS — J449 Chronic obstructive pulmonary disease, unspecified: Secondary | ICD-10-CM

## 2011-01-08 MED ORDER — CYCLOBENZAPRINE HCL 10 MG PO TABS: 10.0000 mg | ORAL_TABLET | Freq: Three times a day (TID) | ORAL | Status: DC | PRN

## 2011-01-08 NOTE — Assessment & Plan Note (Signed)
She had oxygen desaturation on exertion.  She will continue to use oxygen with exertion and sleep.

## 2011-01-08 NOTE — Patient Instructions (Signed)
Chest xray today >> will call with results Follow up in 3 months 

## 2011-01-08 NOTE — Assessment & Plan Note (Signed)
She had improvement after recent course of antibiotics, but now has recurrent cough.  Will repeat chest xray.  Likely from recurrent aspiration.

## 2011-01-08 NOTE — Telephone Encounter (Signed)
Last seen 08/15/10 and filled 11/06/10 # 60 with 2 refills.

## 2011-01-08 NOTE — Assessment & Plan Note (Signed)
She is to continue her current inhaler regimen. 

## 2011-01-08 NOTE — Progress Notes (Signed)
Chief Complaint  Patient presents with  . Follow-up    Pt states her breathing is better with the oxygen. Pt c/o cough (unsure color). Pt denies any fever, wheezing, chest tightness, nausea, vomiting. Pt would like flu shot    History of Present Illness:  CC: [name]  65 yo female with history of COPD/emphysema, chronic hypoxemic respiratory failure, and recurrent pneumonia.  She felt better after finishing antibiotics.  She then started getting more cough with congestion.  She has not had fever, chills, chest pain, wheeze, sore throat, or sinus congestion.  Past Medical History  Diagnosis Date  . Small bowel obstruction   . Anemia   . HTN (hypertension)   . Osteoarthritis   . GERD (gastroesophageal reflux disease)   . CAD (coronary artery disease)     mild disease per cath in 2008  . LBBB (left bundle branch block)   . Atrial fibrillation     on amiodarone  . 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 Fibromyalgia   . Diverticulosis   . COPD (chronic obstructive pulmonary disease)     -06/28/08 PFT FEV1 1.95 (97%), FVC 2.79 (101%), FEV1% 70, TLC 3.88 (87%), DLCO 64%  . Pneumonia - 03/09, 12/09, 02/10, 06/10  . Pleural effusion      s/p right thoracentesis 03/09  . Small bowel obstruction   . DDD (degenerative disc disease)   . Urinary incontinence     has pacemaker stimulator in her bladder  . RLS (restless legs syndrome)   . Primary dilated cardiomyopathy     EF 45 to 50% per echo in Jan 2012  . Vitamin B12 deficiency   . Gastroparesis   . Chronic nausea   . Chronic abdominal pain   . Chronic pain syndrome   . Gastrocutaneous fistula   . Pacemaker     CRT therapy; followed by Dr. Graciela Husbands  . H/O: GI bleed     from Pradaxa  . Oxygen dependent   . Fibromyalgia     Past Surgical History  Procedure Date  . Colectomy     Intestinal resection (5times),S/P PARTIAL COLECTOMY 2001  . Appendectomy   . Bladder surgery   .  Tonsillectomy   . Total abdominal hysterectomy   . Pacemaker placement     CRT-PPM-Medtronic in October 2011  . Peg placement   . Abdominal adhesion surgery     numerous surgeries  . Peg removed     with complications  . Cardiac catheterization 2008    Current Outpatient Prescriptions on File Prior to Visit  Medication Sig Dispense Refill  . amiodarone (PACERONE) 200 MG tablet Take 1 tablet (200 mg total) by mouth daily.  30 tablet  5  . carvedilol (COREG) 6.25 MG tablet Take 1 tablet (6.25 mg total) by mouth 2 (two) times daily with a meal.  60 tablet  4  . cyclobenzaprine (FLEXERIL) 10 MG tablet Take 1 tablet (10 mg total) by mouth 3 (three) times daily as needed.  60 tablet  2  . denosumab (PROLIA) 60 MG/ML SOLN Inject 60 mg into the skin. Twice a year      . fentaNYL (DURAGESIC - DOSED MCG/HR) 50 MCG/HR Place 1 patch (50 mcg total) onto the skin every 3 (three) days.  10 patch  0  . furosemide (LASIX) 20 MG tablet Take 20 mg by mouth daily.        [name] gabapentin (NEURONTIN) 600 MG tablet TAKE  1 TABLET 4 TIMES DAILY  120 tablet  5  . iron dextran complex (INFED) 50 MG/ML injection Please give infed infusion (no test dose needed) over 4 hours per pharmacy calculated dose. Ht: 5'2 Wt: 99 pounds Hgb: 12  1 mL  0  . metoCLOPramide (REGLAN) 5 MG tablet 1 by mouth three times daily      . nitroGLYCERIN (NITROSTAT) 0.4 MG SL tablet Place 0.4 mg under the tongue every 5 (five) minutes as needed.        . Nutritional Supplements (RESOURCE BREEZE PO) 2 (two) times daily.       [name] oxyCODONE-acetaminophen (ENDOCET) 10-325 MG per tablet Take 1 tablet by mouth every 4 (four) hours as needed.  120 tablet  0  . promethazine (PHENERGAN) 25 MG tablet 1 by mouth every 6 hours as needed      . temazepam (RESTORIL) 15 MG capsule 1-2 tablets at bedtime as needed  60 capsule  0  . tiotropium (SPIRIVA HANDIHALER) 18 MCG inhalation capsule Place 18 mcg into inhaler and inhale daily.        [name] DISCONTD:  pantoprazole (PROTONIX) 40 MG tablet Take one po BID  180 tablet  0   Current Facility-Administered Medications on File Prior to Visit  Medication Dose Route Frequency Provider Last Rate Last Dose  . 0.9 %  sodium chloride infusion  500 mL Intravenous Continuous Hart Carwin, MD      . cyanocobalamin ((VITAMIN B-12)) injection 1,000 mcg  1,000 mcg Intramuscular Q30 days Hart Carwin, MD   1,000 mcg at 12/22/10 1323    Allergies  Allergen Reactions  . Pentazocine Lactate   . Pradaxa   . Sulfonamide Derivatives   . Talwin     Physical Exam:  Blood pressure 128/72, pulse 86, temperature 98 F (36.7 C), temperature source Oral, height 5\' 2"  (1.575 m), weight 97 lb 6.4 oz (44.18 kg), SpO2 95.00%.  General - Thin, using oxygen HEENT - No sinus tenderness, no oral exudate Cardiac - s1s2 regular Chest - no wheeze/rales Abdomen - soft, nontender Extremities - no edema Skin - no rashes Neurologic - normal strength, CN intact Psychiatric - normal mood, behavior   Assessment/Plan:  Recurrent pneumonia She had improvement after recent course of antibiotics, but now has recurrent cough.  Will repeat chest xray.  Likely from recurrent aspiration.  COPD She is to continue her current inhaler regimen.  Chronic hypoxemic respiratory failure She had oxygen desaturation on exertion.  She will continue to use oxygen with exertion and sleep.  Outpatient Encounter Prescriptions as of 01/08/2011  Medication Sig Dispense Refill  . amiodarone (PACERONE) 200 MG tablet Take 1 tablet (200 mg total) by mouth daily.  30 tablet  5  . carvedilol (COREG) 6.25 MG tablet Take 1 tablet (6.25 mg total) by mouth 2 (two) times daily with a meal.  60 tablet  4  . cyclobenzaprine (FLEXERIL) 10 MG tablet Take 1 tablet (10 mg total) by mouth 3 (three) times daily as needed.  60 tablet  2  . denosumab (PROLIA) 60 MG/ML SOLN Inject 60 mg into the skin. Twice a year      . fentaNYL (DURAGESIC - DOSED MCG/HR) 50  MCG/HR Place 1 patch (50 mcg total) onto the skin every 3 (three) days.  10 patch  0  . furosemide (LASIX) 20 MG tablet Take 20 mg by mouth daily.        [name] gabapentin (NEURONTIN) 600 MG tablet TAKE 1 TABLET  4 TIMES DAILY  120 tablet  5  . iron dextran complex (INFED) 50 MG/ML injection Please give infed infusion (no test dose needed) over 4 hours per pharmacy calculated dose. Ht: 5'2 Wt: 99 pounds Hgb: 12  1 mL  0  . metoCLOPramide (REGLAN) 5 MG tablet 1 by mouth three times daily      . nitrofurantoin, macrocrystal-monohydrate, (MACROBID) 100 MG capsule Twice a day      . nitroGLYCERIN (NITROSTAT) 0.4 MG SL tablet Place 0.4 mg under the tongue every 5 (five) minutes as needed.        . Nutritional Supplements (RESOURCE BREEZE PO) 2 (two) times daily.       [name] oxyCODONE-acetaminophen (ENDOCET) 10-325 MG per tablet Take 1 tablet by mouth every 4 (four) hours as needed.  120 tablet  0  . pantoprazole (PROTONIX) 40 MG tablet Take one tablet twice a day       . promethazine (PHENERGAN) 25 MG tablet 1 by mouth every 6 hours as needed      . temazepam (RESTORIL) 15 MG capsule 1-2 tablets at bedtime as needed  60 capsule  0  . tiotropium (SPIRIVA HANDIHALER) 18 MCG inhalation capsule Place 18 mcg into inhaler and inhale daily.        [name] DISCONTD: pantoprazole (PROTONIX) 40 MG tablet Take one po BID  180 tablet  0  . DISCONTD: meloxicam (MOBIC) 15 MG tablet TAKE 1/2 TO 1 TABLET DAILY AS NEEDED  30 tablet  0  . DISCONTD: ofloxacin (FLOXIN) 400 MG tablet Take 400 mg by mouth daily.         Facility-Administered Encounter Medications as of 01/08/2011  Medication Dose Route Frequency Provider Last Rate Last Dose  . 0.9 %  sodium chloride infusion  500 mL Intravenous Continuous Hart Carwin, MD      . cyanocobalamin ((VITAMIN B-12)) injection 1,000 mcg  1,000 mcg Intramuscular Q30 days Hart Carwin, MD   1,000 mcg at 12/22/10 1323    Yuliet Needs 01/08/2011, 3:38 PM

## 2011-01-10 ENCOUNTER — Encounter: Payer: Self-pay | Admitting: Internal Medicine

## 2011-01-12 ENCOUNTER — Telehealth: Payer: Self-pay | Admitting: Pulmonary Disease

## 2011-01-12 ENCOUNTER — Telehealth: Payer: Self-pay | Admitting: Family Medicine

## 2011-01-12 MED ORDER — FENTANYL 50 MCG/HR TD PT72: 1.0000 | MEDICATED_PATCH | TRANSDERMAL | Status: DC

## 2011-01-12 MED ORDER — OXYCODONE-ACETAMINOPHEN 10-325 MG PO TABS: 1.0000 | ORAL_TABLET | ORAL | Status: DC | PRN

## 2011-01-12 NOTE — Telephone Encounter (Signed)
Last seen 08/15/10 and filled 12/14/10. Please advise      KP

## 2011-01-12 NOTE — Telephone Encounter (Signed)
Left at check In    KP

## 2011-01-12 NOTE — Telephone Encounter (Signed)
Ok to refill x 1  

## 2011-01-12 NOTE — Telephone Encounter (Signed)
Chest xray from 01/08/11 shows b/l patchy interstitial and airspace opacities, no change.  Asked pt to return call to discuss CXR in more detail.

## 2011-01-12 NOTE — Telephone Encounter (Signed)
Refill oxycodone - fentanyl patch - patient will pick up Tuesday [id number]

## 2011-01-16 ENCOUNTER — Encounter: Payer: Self-pay | Admitting: Internal Medicine

## 2011-01-16 ENCOUNTER — Ambulatory Visit (INDEPENDENT_AMBULATORY_CARE_PROVIDER_SITE_OTHER): Payer: Medicare Other

## 2011-01-16 ENCOUNTER — Telehealth: Payer: Self-pay | Admitting: *Deleted

## 2011-01-16 ENCOUNTER — Ambulatory Visit (INDEPENDENT_AMBULATORY_CARE_PROVIDER_SITE_OTHER): Payer: Medicare Other | Admitting: Internal Medicine

## 2011-01-16 ENCOUNTER — Other Ambulatory Visit: Payer: Self-pay | Admitting: Family Medicine

## 2011-01-16 DIAGNOSIS — I82629 Acute embolism and thrombosis of deep veins of unspecified upper extremity: Secondary | ICD-10-CM

## 2011-01-16 DIAGNOSIS — M6281 Muscle weakness (generalized): Secondary | ICD-10-CM

## 2011-01-16 DIAGNOSIS — I428 Other cardiomyopathies: Secondary | ICD-10-CM

## 2011-01-16 DIAGNOSIS — Z23 Encounter for immunization: Secondary | ICD-10-CM

## 2011-01-16 DIAGNOSIS — R29898 Other symptoms and signs involving the musculoskeletal system: Secondary | ICD-10-CM

## 2011-01-16 DIAGNOSIS — I5022 Chronic systolic (congestive) heart failure: Secondary | ICD-10-CM

## 2011-01-16 DIAGNOSIS — I4891 Unspecified atrial fibrillation: Secondary | ICD-10-CM

## 2011-01-16 DIAGNOSIS — Z950 Presence of cardiac pacemaker: Secondary | ICD-10-CM

## 2011-01-16 LAB — PACEMAKER DEVICE OBSERVATION
AL AMPLITUDE: 1.5 mv
AL IMPEDENCE PM: 418 Ohm
AL THRESHOLD: 0.875 V
ATRIAL PACING PM: 0.21
BAMS-0001: 170 {beats}/min
BATTERY VOLTAGE: 3.0087 V
LV LEAD THRESHOLD: 1 V
RV LEAD AMPLITUDE: 5.625 mv
RV LEAD IMPEDENCE PM: 475 Ohm
RV LEAD THRESHOLD: 0.75 V
VENTRICULAR PACING PM: 99.96

## 2011-01-16 NOTE — Patient Instructions (Signed)
Your physician recommends that you schedule a follow-up appointment in: 3 months with Kristin/Paula.  Your physician recommends that you continue on your current medications as directed. Please refer to the Current Medication list given to you today.  You have been given an order to have a TSH drawn. Please take this order with you when you have your next lab drawn so the results can be faxed to Dr. Graciela Husbands.

## 2011-01-16 NOTE — Telephone Encounter (Signed)
Faxed.   KP 

## 2011-01-16 NOTE — Assessment & Plan Note (Signed)
No intercurrent AF  Continue amio  wiould decease dose at next visit. Surveillance labs nl lfts in aug, TSH to be drawn

## 2011-01-16 NOTE — Telephone Encounter (Signed)
Message copied by Daphine Deutscher on Tue Jan 16, 2011 11:51 AM ------      Message from: Daphine Deutscher      Created: Wed Jan 03, 2011 11:13 AM       Did patient decide on IBD marker lab

## 2011-01-16 NOTE — Telephone Encounter (Signed)
OK 

## 2011-01-16 NOTE — Telephone Encounter (Signed)
Spoke with patient and she wants to "hold off" on doing the IBD marker lab for now.

## 2011-01-16 NOTE — Telephone Encounter (Signed)
Last seen 08/15/10 and filled 12/19/10 please advise    KP

## 2011-01-16 NOTE — Progress Notes (Signed)
HPI  [name] is a 65 y.o. female seen in followup for congestive heart failure in the setting of nonischemic cardiomyopathy. She underwent CRT-P implantation October 2011. This has been associated with significant improvement although complicated by left upper extremity deep venous thrombosis.   The patient denies chest pain, shortness of breath, nocturnal dyspnea, orthopnea or peripheral edema. There have been no palpitations, lightheadedness or syncope.  There is progressive weakness in the lower extremeties;  Seen by ortho treated with injection as there has been pain   She also has a history of atrial arrhythmias for which she was treated with amiodarone. LFTs normal  In aug; TSH last checked in April    She has complaints of some thumping    Past Medical History  Diagnosis Date  . Small bowel obstruction   . Anemia   . HTN (hypertension)   . Osteoarthritis   . GERD (gastroesophageal reflux disease)   . CAD (coronary artery disease)     mild disease per cath in 2008  . LBBB (left bundle branch block)   . Atrial fibrillation     on amiodarone  . 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 Fibromyalgia   . Diverticulosis   . COPD (chronic obstructive pulmonary disease)     -06/28/08 PFT FEV1 1.95 (97%), FVC 2.79 (101%), FEV1% 70, TLC 3.88 (87%), DLCO 64%  . Pneumonia - 03/09, 12/09, 02/10, 06/10  . Pleural effusion      s/p right thoracentesis 03/09  . Small bowel obstruction   . DDD (degenerative disc disease)   . Urinary incontinence     has pacemaker stimulator in her bladder  . RLS (restless legs syndrome)   . Primary dilated cardiomyopathy     EF 45 to 50% per echo in Jan 2012  . Vitamin B12 deficiency   . Gastroparesis   . Chronic nausea   . Chronic abdominal pain   . Chronic pain syndrome   . Gastrocutaneous fistula   . Pacemaker     CRT therapy; followed by Dr. Graciela Husbands  . H/O: GI bleed     from Pradaxa  . Oxygen  dependent   . Fibromyalgia     Past Surgical History  Procedure Date  . Colectomy     Intestinal resection (5times),S/P PARTIAL COLECTOMY 2001  . Appendectomy   . Bladder surgery   . Tonsillectomy   . Total abdominal hysterectomy   . Pacemaker placement     CRT-PPM-[employer]tr[employer] in October 2011  . Peg placement   . Abdominal adhesion surgery     numerous surgeries  . Peg removed     with complications  . Cardiac catheterization 2008    Current Outpatient Prescriptions  Medication Sig Dispense Refill  . amiodarone (PACERONE) 200 MG tablet Take 1 tablet (200 mg total) by mouth daily.  30 tablet  5  . carvedilol (COREG) 6.25 MG tablet Take 1 tablet (6.25 mg total) by mouth 2 (two) times daily with a meal.  60 tablet  4  . cyclobenzaprine (FLEXERIL) 10 MG tablet Take 1 tablet (10 mg total) by mouth 3 (three) times daily as needed.  60 tablet  2  . denosumab (PROLIA) 60 MG/ML SOLN Inject 60 mg into the skin. Twice a year      . fentaNYL (DURAGESIC - DOSED MCG/HR) 50 MCG/HR Place 1 patch (50 mcg total) onto the skin every 3 (three) days.  10 patch  0  .  furosemide (LASIX) 20 MG tablet Take 20 mg by mouth daily.        [name] gabapentin (NEURONTIN) 600 MG tablet TAKE 1 TABLET 4 TIMES DAILY  120 tablet  5  . iron dextran complex (INFED) 50 MG/ML injection Please give infed infusion (no test dose needed) over 4 hours per pharmacy calculated dose. Ht: 5'2 Wt: 99 pounds Hgb: 12  1 mL  0  . metoCLOPramide (REGLAN) 5 MG tablet 1 by mouth three times daily      . nitrofurantoin, macrocrystal-monohydrate, (MACROBID) 100 MG capsule Twice a day      . nitroGLYCERIN (NITROSTAT) 0.4 MG SL tablet Place 0.4 mg under the tongue every 5 (five) minutes as needed.        . Nutritional Supplements (RESOURCE BREEZE PO) 2 (two) times daily.       [name] oxyCODONE-acetaminophen (ENDOCET) 10-325 MG per tablet Take 1 tablet by mouth every 4 (four) hours as needed.  120 tablet  0  . pantoprazole (PROTONIX) 40 MG tablet  Take one tablet twice a day       . promethazine (PHENERGAN) 25 MG tablet 1 by mouth every 6 hours as needed      . temazepam (RESTORIL) 15 MG capsule 1-2 tablets at bedtime as needed  60 capsule  0  . tiotropium (SPIRIVA HANDIHALER) 18 MCG inhalation capsule Place 18 mcg into inhaler and inhale daily.         Current Facility-Administered Medications  Medication Dose Route Frequency Provider Last Rate Last Dose  . 0.9 %  sodium chloride infusion  500 mL Intravenous Continuous Hart Carwin, MD      . cyanocobalamin ((VITAMIN B-12)) injection 1,000 mcg  1,000 mcg Intramuscular Q30 days Hart Carwin, MD   1,000 mcg at 12/22/10 1323    Allergies  Allergen Reactions  . Pentazocine Lactate   . Pradaxa   . Sulfonamide Derivatives   . Talwin     Review of Systems negative except from HPI and PMH  Physical Exam Well developed and cachecticwoman in no acute distress HENT normal E scleral and icterus clear Neck Supple JVP flat; carotids brisk and full Transcutanaous deep venous catheter in place Clear to ausculation Regular rate and rhythm, no murmurs gallops or rub Soft with active bowel sounds No clubbing cyanosis and edema Alert and oriented, weakness r>l hip flexors, with some give way weakness; intact reflexes Skin Warm and Dry  ECG p synchronus biventricular pacing  Assessment and  Plan

## 2011-01-16 NOTE — Assessment & Plan Note (Signed)
Not on ARB/ACE  Will need to review chart.  EF 45% at last assessment jan 2012

## 2011-01-16 NOTE — Assessment & Plan Note (Signed)
Stable on current meds; not above

## 2011-01-16 NOTE — Assessment & Plan Note (Signed)
Largely resolved.  

## 2011-01-16 NOTE — Assessment & Plan Note (Signed)
The patient's device was interrogated and the information was fully reviewed.  The device was reprogrammed to decrease LV output to minimize the possibility of diaghramgtic stimulation

## 2011-01-16 NOTE — Assessment & Plan Note (Signed)
The patient has give way weakness at least in part; I'm not sure if the primary muscle weakness or not. She is to see her primary care physician within a few weeks.

## 2011-01-17 ENCOUNTER — Encounter: Payer: Self-pay | Admitting: Internal Medicine

## 2011-01-19 ENCOUNTER — Encounter (INDEPENDENT_AMBULATORY_CARE_PROVIDER_SITE_OTHER): Payer: Medicare Other | Admitting: General Surgery

## 2011-01-22 ENCOUNTER — Encounter (INDEPENDENT_AMBULATORY_CARE_PROVIDER_SITE_OTHER): Payer: Self-pay | Admitting: General Surgery

## 2011-01-22 ENCOUNTER — Ambulatory Visit (INDEPENDENT_AMBULATORY_CARE_PROVIDER_SITE_OTHER): Payer: Medicare Other | Admitting: General Surgery

## 2011-01-22 VITALS — BP 108/66 | HR 80 | Temp 98.0°F | Resp 24 | Ht 62.0 in | Wt 100.1 lb

## 2011-01-22 DIAGNOSIS — K632 Fistula of intestine: Secondary | ICD-10-CM

## 2011-01-22 NOTE — Progress Notes (Signed)
Subjective:     Patient ID: [name], female   DOB: 1945-08-19, 65 y.o.   MRN: [id number]  HPI This is a 65 year old female well-known to me for a chronic small bowel obstruction as well as and really what appears to be an unknown condition relative to her bowels. I did take her to the operating room in the past for an extensive lysis of adhesions as well as a placement of gastrostomy tube. This gastrostomy tube was then removed and attempted to try to close this hole. This leak and then now has been slowly healing. She now is tolerating a regular diet. She still has nausea and vomiting a couple of times a week at this point. Her hole from her gastrostomy really has very minimal output and appears to be healed on my examination today. She is on total parenteral nutrition at this point in her protein numbers are much better. Check she looks very well today and has gained some weight. She is on home oxygen since I had last seen her also. She is having bowel movements which certainly are continuing and some are consistent with diarrhea. These occur at the same time she is having nausea and vomiting. She has been evaluated by Dr. Dickie La in his stool currently undergoing evaluation. The last pathology I have is from an ileal biopsy showing minimal active ileitis with focal ulceration.   Review of Systems     Objective:   Physical Exam Healed midline wound, gastrostomy site nearly healed with no real opening I can identify today    Assessment:     Chronic low grade bowel obstruction ? Inflammatory bowel disorder Healing gastrocutaneous fistula    Plan:          I don't begin there is any surgical treatment for this from my standpoint. If she wants any  further surgical evaluation I would refer to a tertiary Medical Center. I think that the  ideas plan   with Dr. Delia Chimes continuing to evaluate her medically as I'm not sure exactly why she has a lot of the different issues that she has. I'm not  sure if there is an underlying inflammatory bowel disorder that would explain  the years of these problems. She is due  to see Dr. Juanda Chance next week I will follow her up as needed.  If she desires to see another surgeon or be re-evaluated I would be happy to try and facilitate that.

## 2011-01-23 ENCOUNTER — Encounter: Payer: Self-pay | Admitting: Internal Medicine

## 2011-01-23 ENCOUNTER — Other Ambulatory Visit: Payer: Self-pay | Admitting: Urology

## 2011-01-23 ENCOUNTER — Ambulatory Visit (INDEPENDENT_AMBULATORY_CARE_PROVIDER_SITE_OTHER): Payer: Medicare Other | Admitting: Internal Medicine

## 2011-01-23 DIAGNOSIS — E538 Deficiency of other specified B group vitamins: Secondary | ICD-10-CM

## 2011-01-29 ENCOUNTER — Other Ambulatory Visit: Payer: Self-pay | Admitting: *Deleted

## 2011-01-29 ENCOUNTER — Telehealth: Payer: Self-pay | Admitting: Internal Medicine

## 2011-01-29 ENCOUNTER — Encounter (HOSPITAL_BASED_OUTPATIENT_CLINIC_OR_DEPARTMENT_OTHER): Payer: Self-pay | Admitting: *Deleted

## 2011-01-29 NOTE — Telephone Encounter (Signed)
Replacement of generator of innerstem battery for incontinence device.   This was an elective procedure and Dr Baruch Goldmann said she needed cardiac clearance  Please advise.  I spoke with Pam and let her know Dr Graciela Husbands is out all week and it will be next week before we get back to her.  She is okay with this

## 2011-01-29 NOTE — Telephone Encounter (Signed)
From: Hart Carwin, MD    Sent: 01/29/2011  10:31 AM      To: Vernia Buff, CMA Subject: ?? Reglan                                      I would prefer for her not to take Reglan at this time. She is having diarrhea and  Depression.[name] ----- Message -----    From: Vernia Buff, CMA    Sent: 01/29/2011   8:28 AM      To: Hart Carwin, MD  Dr Juanda Chance- I have a refill request from patient for metoclopramide. She last got it filled 12/11/10. However, I cant quite figure out if she is supposed to be on Reglan now or not. It was discontinued on her medication list due to "change in therapy," Is she supposed to still be on Reglan?

## 2011-01-29 NOTE — Progress Notes (Signed)
Reviewed chart w/ Dr Shireen Quan, w/ info from epic (Dr Graciela Husbands note and pt hx), states pt needs cardiac clearance then pt need to be revaluated.

## 2011-01-29 NOTE — Telephone Encounter (Signed)
New problem;   Per Pam  Pt need clearance for surgery. Cancel on 11/13 by anesthesiology .

## 2011-01-30 ENCOUNTER — Encounter (HOSPITAL_BASED_OUTPATIENT_CLINIC_OR_DEPARTMENT_OTHER): Admission: RE | Payer: Self-pay | Source: Ambulatory Visit

## 2011-01-30 ENCOUNTER — Other Ambulatory Visit (HOSPITAL_COMMUNITY): Payer: Self-pay | Admitting: Family Medicine

## 2011-01-30 ENCOUNTER — Ambulatory Visit (HOSPITAL_BASED_OUTPATIENT_CLINIC_OR_DEPARTMENT_OTHER): Admission: RE | Admit: 2011-01-30 | Payer: Medicare Other | Source: Ambulatory Visit | Admitting: Urology

## 2011-01-30 ENCOUNTER — Other Ambulatory Visit: Payer: Self-pay | Admitting: Internal Medicine

## 2011-01-30 ENCOUNTER — Other Ambulatory Visit: Payer: Self-pay | Admitting: Family Medicine

## 2011-01-30 DIAGNOSIS — M25551 Pain in right hip: Secondary | ICD-10-CM

## 2011-01-30 HISTORY — DX: Heart failure, unspecified: I50.9

## 2011-01-30 SURGERY — REVISION, SACRAL NERVE STIMULATOR, INTERSTIM
Anesthesia: General

## 2011-02-02 ENCOUNTER — Encounter: Payer: Self-pay | Admitting: Internal Medicine

## 2011-02-02 ENCOUNTER — Ambulatory Visit (INDEPENDENT_AMBULATORY_CARE_PROVIDER_SITE_OTHER): Payer: Medicare Other | Admitting: Internal Medicine

## 2011-02-02 VITALS — BP 100/58 | HR 72 | Ht 62.0 in | Wt 100.0 lb

## 2011-02-02 DIAGNOSIS — R7401 Elevation of levels of liver transaminase levels: Secondary | ICD-10-CM

## 2011-02-02 DIAGNOSIS — K56609 Unspecified intestinal obstruction, unspecified as to partial versus complete obstruction: Secondary | ICD-10-CM

## 2011-02-02 NOTE — Progress Notes (Signed)
[name] 1945-12-18 MRN [id number]     History of Present Illness:  This is a 65 year old white female with chronic low-grade GI blood loss due to ileitis. She is status post recent flexible sigmoidoscopy to 70 cm with findings of a subtotal colectomy with a remaining 20 cm of the colon and ulceration at 60 cm in the ileum with a concentric stricture. We were unable to advance beyond the 70 cm mark. Biopsies showed minimally active ileitis with focal ulceration. The differential diagnosis included NSAID's. She has been on total  parenteral nutrition. Her serum prealbumin ranges between 13 and 17. She has a partial small bowel obstruction due to adhesive disease. She is status post exploratory laparotomy in December 2011 and lysis of adhesions and placement of a gastrostomy which became a gastrocutaneous fistula after removal of gastrostomy. The fistula has finally closed after several months of bowel rest and cTNA.[name] Her liver function tests have been elevated while on TPN with her last enzymes showing an AST of 169, ALT 166 and alkaline phosphatase of 426. She has frequent vomiting of acid and bilious material. Her weight has remained stable at 100 pounds. She just saw Dr. Dwain Ross, her surgeon and there are no plans for further surgery.   Past Medical History  Diagnosis Date  . Small bowel obstruction   . Anemia   . HTN (hypertension)   . Systolic CHF, chronic   . GERD (gastroesophageal reflux disease)   . CAD (coronary artery disease)     mild disease per cath in 2008  . LBBB (left bundle branch block)   . Atrial fibrillation     on amiodarone  . Venous embolism and thrombosis of subclavian vein     after pacemaker insertion Oct 2011  . Fibromyalgia   . Diverticulosis   . COPD (chronic obstructive pulmonary disease)     -06/28/08 PFT FEV1 1.95 (97%), FVC 2.79 (101%), FEV1% 70, TLC 3.88 (87%), DLCO 64%  . Pneumonia - 03/09, 12/09, 02/10, 06/10  . Pleural effusion      s/p right  thoracentesis 03/09  . Small bowel obstruction   . DDD (degenerative disc disease)   . Urinary incontinence     has pacemaker stimulator in her bladder  . RLS (restless legs syndrome)   . Primary dilated cardiomyopathy     EF 45 to 50% per echo in Jan 2012  . Vitamin B12 deficiency   . Gastroparesis   . Chronic nausea   . Chronic abdominal pain   . Chronic pain syndrome   . Gastrocutaneous fistula   . Pacemaker     CRT therapy; followed by Dr. Graciela Ross  . H/O: GI bleed     from Pradaxa  . Oxygen dependent   . Fibromyalgia   . CHF (congestive heart failure)    Past Surgical History  Procedure Date  . Colectomy     Intestinal resection (5times),S/P PARTIAL COLECTOMY 2001  . Appendectomy   . Bladder surgery   . Tonsillectomy   . Total abdominal hysterectomy   . Pacemaker placement     CRT-PPM-Medtronic in October 2011  . Peg placement   . Abdominal adhesion surgery     numerous surgeries  . Peg removed     with complications  . Cardiac catheterization 2008    reports that she quit smoking about 24 years ago. Her smoking use included Cigarettes. She has a 20 pack-year smoking history. She has never used smokeless tobacco. She reports that she does  not drink alcohol or use illicit drugs. family history includes Breast cancer in her maternal aunt; Diabetes in her paternal grandfather; Heart disease in her brother, father, mother, and paternal aunt; and Stroke in her mother.  There is no history of Colon cancer. Allergies  Allergen Reactions  . Pentazocine Lactate   . Pradaxa   . Sulfonamide Derivatives   . Talwin         Review of Systems:Denies dysphagia. Positive full intermittent nausea and vomiting positive for diarrhea negative for rectal bleeding  The remainder of the 10 point ROS is negative except as outlined in H&P   Physical Exam: General appearance  Well developed, in no distress.Same alert and oriented  Eyes- non icteric. HEENT nontraumatic,  normocephalic., Nasal oxygen  Mouth no lesions, tongue papillated, no cheilosis. Neck supple without adenopathy, thyroid not enlarged, no carotid bruits, no JVD. Lungs Clear to auscultation bilaterally. Cor normal S1, normal S2, regular rhythm, no murmur,  quiet precordium. Abdomen: Firm, tender in left lower quadrant. Percutaneous gastrostomy site is healed. There is no drainage. There is only mild erythema surrounding the scar. Normoactive bowel sounds. Increased tympany.  Rectal:Not done. Extremities no pedal edema. Skin no lesions., PICC line in place  Neurological alert and oriented x 3. Psychological normal mood and affect.  Assessment and Plan:  Problem #1 Partial chronic small bowel obstruction, relatively stable. Although the gastro-cutaneous fistula has  Closed  patient is in need of TPN for further nutritional support. There are inflammatory changes at the ileum suggestive of either Crohn's disease or anti-inflammatory agent related ulcerations. We have discussed trial of prednisone 20 mg a day for next 4 weeks to see if we can control the inflammation. She declined having IBD markers because of the cost. She will continue TPN at a reduced rate as dicussed by [employer]. We will reduce Intralipid but maintain the same caloric intake. This will hopefully normalize her LFT;'s which have been rising..  Problem #2 Abnormal liver function tests. This is most likely due to TPN. I have talked to the pharmacist at [facility] and we decrease the lipids and increase the calories from other sources. She will have liver function tests rechecked in one week.     02/02/2011 [name]

## 2011-02-02 NOTE — Patient Instructions (Addendum)
We have sent the following medications to your pharmacy for you to pick up at your convenience: Phenergan Prednisone 20 mg daily Please follow up with Dr Juanda Chance around 02/2011 CC: Dr Dwain Sarna

## 2011-02-04 NOTE — Telephone Encounter (Signed)
She was seen in the office two weeks ago   She is stable for surgery  For replacement of device   If this is a new device i need to know that  Prior to proceeding steve

## 2011-02-05 ENCOUNTER — Encounter (HOSPITAL_COMMUNITY)
Admission: RE | Admit: 2011-02-05 | Discharge: 2011-02-05 | Disposition: A | Discharge status: Home / Self Care | Payer: Medicare Other | Source: Ambulatory Visit | Attending: Family Medicine | Admitting: Family Medicine

## 2011-02-05 ENCOUNTER — Ambulatory Visit (HOSPITAL_COMMUNITY)
Admission: RE | Admit: 2011-02-05 | Discharge: 2011-02-05 | Disposition: A | Discharge status: Home / Self Care | Payer: Medicare Other | Source: Ambulatory Visit | Attending: Family Medicine | Admitting: Family Medicine

## 2011-02-05 DIAGNOSIS — M84353A Stress fracture, unspecified femur, initial encounter for fracture: Secondary | ICD-10-CM | POA: Insufficient documentation

## 2011-02-05 MED ORDER — TECHNETIUM TC 99M MEDRONATE IV KIT
25.0000 | PACK | Freq: Once | INTRAVENOUS | Status: AC | PRN
  Administered 2011-02-05: 26.5 via INTRAVENOUS

## 2011-02-05 NOTE — Telephone Encounter (Signed)
LM for Pam to call to verify this is just a generator replacement and not a new device.

## 2011-02-06 ENCOUNTER — Encounter: Payer: Self-pay | Admitting: *Deleted

## 2011-02-06 ENCOUNTER — Telehealth: Payer: Self-pay | Admitting: *Deleted

## 2011-02-06 MED ORDER — PREDNISONE 20 MG PO TABS: ORAL_TABLET | ORAL | Status: DC

## 2011-02-06 NOTE — Telephone Encounter (Signed)
Patient calling to let us know the rx for Prednisone from last Friday did not go to her pharmacy. Rx resent.

## 2011-02-06 NOTE — Telephone Encounter (Signed)
Letter done and left with Medical Records to fax.

## 2011-02-06 NOTE — Telephone Encounter (Signed)
OK to proceed per Dr. Graciela Husbands.

## 2011-02-06 NOTE — Telephone Encounter (Signed)
F/u:  Chasity called from [employer] with information on pts. generator.  It is a replacement of neuro stimulator.  If you have any more questions please call her at [phone] ext [phone]

## 2011-02-13 ENCOUNTER — Other Ambulatory Visit: Payer: Self-pay | Admitting: Urology

## 2011-02-15 ENCOUNTER — Ambulatory Visit: Payer: Medicare Other | Admitting: Family Medicine

## 2011-02-19 ENCOUNTER — Encounter: Payer: Self-pay | Admitting: Family Medicine

## 2011-02-19 ENCOUNTER — Ambulatory Visit (INDEPENDENT_AMBULATORY_CARE_PROVIDER_SITE_OTHER): Payer: Medicare Other | Admitting: Family Medicine

## 2011-02-19 VITALS — BP 112/66 | HR 79 | Temp 97.9°F | Wt 101.4 lb

## 2011-02-19 DIAGNOSIS — E785 Hyperlipidemia, unspecified: Secondary | ICD-10-CM

## 2011-02-19 DIAGNOSIS — R300 Dysuria: Secondary | ICD-10-CM

## 2011-02-19 DIAGNOSIS — R52 Pain, unspecified: Secondary | ICD-10-CM

## 2011-02-19 LAB — POCT URINALYSIS DIPSTICK
Glucose, UA: NEGATIVE
Nitrite, UA: NEGATIVE
Protein, UA: NEGATIVE
Spec Grav, UA: <=1.005
Urobilinogen, UA: 0.2
pH, UA: 5

## 2011-02-19 MED ORDER — CIPROFLOXACIN HCL 500 MG PO TABS: 500.0000 mg | ORAL_TABLET | Freq: Two times a day (BID) | ORAL | Status: AC

## 2011-02-19 MED ORDER — OXYCODONE-ACETAMINOPHEN 10-325 MG PO TABS: 1.0000 | ORAL_TABLET | ORAL | Status: DC | PRN

## 2011-02-19 MED ORDER — FENTANYL 50 MCG/HR TD PT72: 1.0000 | MEDICATED_PATCH | TRANSDERMAL | Status: DC

## 2011-02-19 NOTE — Patient Instructions (Signed)

## 2011-02-20 NOTE — Progress Notes (Deleted)
  Subjective:    [name] is a 65 y.o. female who complains of {uti sx:16137}. She has had symptoms for {1-10:13787} {time; units:18646}. Patient also complains of {symptoms; ZOX:09604}. Patient denies {symptoms VWU:98119}. Patient {does/does not:19097::"does not"} have a history of recurrent UTI. Patient {does/does not:19097::"does not"} have a history of pyelonephritis.   {Common ambulatory SmartLinks:19316}  Review of Systems {ros; complete:30496}    Objective:    {exam; complete:17964}  Laboratory:  Urine dipstick: {UA dip:16139}.   Micro exam: {urine micro exam:5114}.    Assessment:    {diagnosis:16142::"Acute cystitis"}     Plan:    {JYNW:29562}

## 2011-02-20 NOTE — Progress Notes (Signed)
  Subjective:    [name] is a 65 y.o. female who complains of burning with urination and foul smelling urine. She has had symptoms for 4 days. Patient also complains of back pain. Patient denies congestion, cough, fever, headache, rhinitis, sorethroat and vaginal discharge. Patient does not have a history of recurrent UTI. Patient does not have a history of pyelonephritis.   The following portions of the patient's history were reviewed and updated as appropriate: allergies, current medications, past family history, past medical history, past social history, past surgical history and problem list.  Review of Systems Pertinent items are noted in HPI.    Objective:    BP 112/66  Pulse 79  Temp(Src) 97.9 F (36.6 C) (Oral)  Wt 101 lb 6.4 oz (45.995 kg)  SpO2 97% General appearance: alert, cooperative, appears stated age and no distress   Laboratory:  Urine dipstick: trace for ketones, trace for leukocyte esterase, negative for nitrites and trace for urobilinogen.   Micro exam: not done.    Assessment:    dysuria    chronic pain---meds refilled   Htn-- stable osteoporosis Plan:    Medications: ciprofloxacin. Maintain adequate hydration. Follow up if symptoms not improving, and as needed.

## 2011-02-21 LAB — URINE CULTURE: Colony Count: >=100000

## 2011-02-26 ENCOUNTER — Ambulatory Visit (INDEPENDENT_AMBULATORY_CARE_PROVIDER_SITE_OTHER): Payer: Medicare Other | Admitting: Internal Medicine

## 2011-02-26 DIAGNOSIS — E538 Deficiency of other specified B group vitamins: Secondary | ICD-10-CM

## 2011-02-27 ENCOUNTER — Encounter (HOSPITAL_COMMUNITY): Payer: Self-pay | Admitting: Pharmacy Technician

## 2011-02-27 ENCOUNTER — Encounter: Payer: Self-pay | Admitting: Internal Medicine

## 2011-02-28 ENCOUNTER — Encounter (HOSPITAL_COMMUNITY): Payer: Self-pay

## 2011-02-28 ENCOUNTER — Encounter (HOSPITAL_COMMUNITY)
Admission: RE | Admit: 2011-02-28 | Discharge: 2011-02-28 | Disposition: A | Discharge status: Home / Self Care | Payer: Medicare Other | Source: Ambulatory Visit | Attending: Urology | Admitting: Urology

## 2011-02-28 ENCOUNTER — Ambulatory Visit (HOSPITAL_COMMUNITY)
Admission: RE | Admit: 2011-02-28 | Discharge: 2011-02-28 | Disposition: A | Discharge status: Home / Self Care | Payer: Medicare Other | Source: Ambulatory Visit | Attending: Urology | Admitting: Urology

## 2011-02-28 DIAGNOSIS — Z01812 Encounter for preprocedural laboratory examination: Secondary | ICD-10-CM | POA: Insufficient documentation

## 2011-02-28 DIAGNOSIS — Z01818 Encounter for other preprocedural examination: Secondary | ICD-10-CM | POA: Insufficient documentation

## 2011-02-28 DIAGNOSIS — Z87891 Personal history of nicotine dependence: Secondary | ICD-10-CM | POA: Insufficient documentation

## 2011-02-28 DIAGNOSIS — I10 Essential (primary) hypertension: Secondary | ICD-10-CM | POA: Insufficient documentation

## 2011-02-28 HISTORY — DX: Personal history of (healed) stress fracture: Z87.312

## 2011-02-28 HISTORY — DX: Unspecified fracture of lower end of left humerus, initial encounter for closed fracture: S42.402A

## 2011-02-28 LAB — SURGICAL PCR SCREEN
MRSA, PCR: NEGATIVE
Staphylococcus aureus: NEGATIVE

## 2011-02-28 NOTE — Patient Instructions (Addendum)
19 [name]  02/28/2011   Your procedure is scheduled on: 03-06-2011  Report to [facility] at 0530 AM.  Call this number if you have problems the morning of surgery: [phone]   Remember:   Do not eat food:After Midnight.  May have clear liquids:until Midnight .  Clear liquids include soda, tea, black coffee, apple or grape juice, broth.  Take these medicines the morning of surgery with A SIP OF WATER: coreg, neurontin, amiodarone, flexeril if needed, percocet if needed, protonix, prednisone, spiriva   Do not wear jewelry, make-up or nail polish.  Do not wear lotions, powders, or perfumes.  Do not shave 48 hours prior to surgery.  Do not bring valuables to the hospital.  Contacts, dentures or bridgework may not be worn into surgery.  Leave suitcase in the car. After surgery it may be brought to your room.  For patients admitted to the hospital, checkout time is 11:00 AM the day of discharge.   Patients discharged the day of surgery will not be allowed to drive home.  Name and phone number of your driver: george spouse [phone] cell  Special Instructions: CHG Shower Use Special Wash: 1/2 bottle night before surgery and 1/2 bottle morning of surgery.neck down avoid private area   Please read over the following fact sheets that you were given: MRSA Information             Cain Sieve, rn [facility] pre op nurse phone number [phone]              Have [facility] fax labs from 02-28-2011 to [phone] fax for [facility] presurgical testing

## 2011-02-28 NOTE — Pre-Procedure Instructions (Addendum)
Cardiac clearance note dr Graciela Husbands 02-06-2011 on chart Last office visit note dr Graciela Husbands cardiology 12-20-2010 in epic under encounters Last office visit note dr sood pulmonary 01-08-2011 in epic under encounters Spoke with Jana Half [employer]tronic rep will arrive 0730 am 03-06-11 [facility] phone number is [phone] 03-01-2011 cbc with dif, and cmet results [facility] lab on chart

## 2011-03-05 ENCOUNTER — Other Ambulatory Visit (HOSPITAL_COMMUNITY): Payer: Self-pay | Admitting: *Deleted

## 2011-03-06 ENCOUNTER — Encounter (HOSPITAL_COMMUNITY)
Admission: RE | Disposition: A | Discharge status: Hospice | Payer: Self-pay | Source: Ambulatory Visit | Attending: Urology

## 2011-03-06 ENCOUNTER — Encounter (HOSPITAL_COMMUNITY): Payer: Self-pay | Admitting: Anesthesiology

## 2011-03-06 ENCOUNTER — Ambulatory Visit (HOSPITAL_COMMUNITY): Payer: Medicare Other | Admitting: Anesthesiology

## 2011-03-06 ENCOUNTER — Encounter (HOSPITAL_COMMUNITY): Payer: Self-pay | Admitting: *Deleted

## 2011-03-06 ENCOUNTER — Ambulatory Visit (HOSPITAL_COMMUNITY)
Admission: RE | Admit: 2011-03-06 | Discharge: 2011-03-06 | Disposition: A | Discharge status: Hospice | Payer: Medicare Other | Source: Ambulatory Visit | Attending: Urology | Admitting: Urology

## 2011-03-06 DIAGNOSIS — Y831 Surgical operation with implant of artificial internal device as the cause of abnormal reaction of the patient, or of later complication, without mention of misadventure at the time of the procedure: Secondary | ICD-10-CM | POA: Insufficient documentation

## 2011-03-06 DIAGNOSIS — Z79899 Other long term (current) drug therapy: Secondary | ICD-10-CM | POA: Insufficient documentation

## 2011-03-06 DIAGNOSIS — I10 Essential (primary) hypertension: Secondary | ICD-10-CM | POA: Insufficient documentation

## 2011-03-06 DIAGNOSIS — IMO0001 Reserved for inherently not codable concepts without codable children: Secondary | ICD-10-CM | POA: Insufficient documentation

## 2011-03-06 DIAGNOSIS — D649 Anemia, unspecified: Secondary | ICD-10-CM

## 2011-03-06 DIAGNOSIS — N3941 Urge incontinence: Secondary | ICD-10-CM | POA: Insufficient documentation

## 2011-03-06 DIAGNOSIS — T85695A Other mechanical complication of other nervous system device, implant or graft, initial encounter: Secondary | ICD-10-CM | POA: Insufficient documentation

## 2011-03-06 DIAGNOSIS — E538 Deficiency of other specified B group vitamins: Secondary | ICD-10-CM

## 2011-03-06 DIAGNOSIS — D509 Iron deficiency anemia, unspecified: Secondary | ICD-10-CM

## 2011-03-06 DIAGNOSIS — R52 Pain, unspecified: Secondary | ICD-10-CM

## 2011-03-06 DIAGNOSIS — K219 Gastro-esophageal reflux disease without esophagitis: Secondary | ICD-10-CM | POA: Insufficient documentation

## 2011-03-06 DIAGNOSIS — R350 Frequency of micturition: Secondary | ICD-10-CM | POA: Insufficient documentation

## 2011-03-06 DIAGNOSIS — G8929 Other chronic pain: Secondary | ICD-10-CM

## 2011-03-06 HISTORY — PX: INTERSTIM IMPLANT REVISION: SHX5138

## 2011-03-06 SURGERY — REVISION, SACRAL NERVE STIMULATOR, INTERSTIM
Anesthesia: General | Wound class: Clean

## 2011-03-06 MED ORDER — ONDANSETRON HCL 4 MG/2ML IJ SOLN
INTRAMUSCULAR | Status: DC | PRN
  Administered 2011-03-06: 4 mg via INTRAVENOUS

## 2011-03-06 MED ORDER — EPHEDRINE SULFATE 50 MG/ML IJ SOLN
INTRAMUSCULAR | Status: DC | PRN
  Administered 2011-03-06 (×3): 5 mg via INTRAVENOUS

## 2011-03-06 MED ORDER — PROPOFOL 10 MG/ML IV BOLUS
INTRAVENOUS | Status: DC | PRN
  Administered 2011-03-06: 160 mg via INTRAVENOUS

## 2011-03-06 MED ORDER — LACTATED RINGERS IV SOLN: INTRAVENOUS | Status: DC

## 2011-03-06 MED ORDER — LIDOCAINE-EPINEPHRINE (PF) 1 %-1:200000 IJ SOLN: INTRAMUSCULAR | Status: DC | PRN

## 2011-03-06 MED ORDER — MIDAZOLAM HCL 5 MG/5ML IJ SOLN
INTRAMUSCULAR | Status: DC | PRN
  Administered 2011-03-06 (×2): 1 mg via INTRAVENOUS

## 2011-03-06 MED ORDER — SODIUM CHLORIDE 0.9 % IR SOLN
Status: DC | PRN
  Administered 2011-03-06: 08:00:00

## 2011-03-06 MED ORDER — FENTANYL CITRATE 0.05 MG/ML IJ SOLN
INTRAMUSCULAR | Status: DC | PRN
  Administered 2011-03-06: 100 ug via INTRAVENOUS

## 2011-03-06 MED ORDER — LIDOCAINE-EPINEPHRINE 1 %-1:100000 IJ SOLN
INTRAMUSCULAR | Status: AC
  Filled 2011-03-06: qty 1

## 2011-03-06 MED ORDER — HYDROCORTISONE SOD SUCCINATE 100 MG IJ SOLR
INTRAMUSCULAR | Status: AC
  Filled 2011-03-06: qty 2

## 2011-03-06 MED ORDER — GLYCOPYRROLATE 0.2 MG/ML IJ SOLN
INTRAMUSCULAR | Status: DC | PRN
  Administered 2011-03-06: .4 mg via INTRAVENOUS

## 2011-03-06 MED ORDER — CEFAZOLIN SODIUM 1-5 GM-% IV SOLN
INTRAVENOUS | Status: AC
  Filled 2011-03-06: qty 50

## 2011-03-06 MED ORDER — FENTANYL CITRATE 0.05 MG/ML IJ SOLN
25.0000 ug | INTRAMUSCULAR | Status: DC | PRN
  Administered 2011-03-06: 25 ug via INTRAVENOUS

## 2011-03-06 MED ORDER — BUPIVACAINE-EPINEPHRINE (PF) 0.5% -1:200000 IJ SOLN
INTRAMUSCULAR | Status: AC
  Filled 2011-03-06: qty 10

## 2011-03-06 MED ORDER — NEOSTIGMINE METHYLSULFATE 1 MG/ML IJ SOLN
INTRAMUSCULAR | Status: DC | PRN
  Administered 2011-03-06: 3 mg via INTRAVENOUS

## 2011-03-06 MED ORDER — LIDOCAINE HCL (CARDIAC) 20 MG/ML IV SOLN
INTRAVENOUS | Status: DC | PRN
  Administered 2011-03-06: 80 mg via INTRAVENOUS

## 2011-03-06 MED ORDER — PHENYLEPHRINE HCL 10 MG/ML IJ SOLN
10.0000 mg | INTRAVENOUS | Status: DC | PRN
  Administered 2011-03-06: 5 ug/min via INTRAVENOUS

## 2011-03-06 MED ORDER — PROMETHAZINE HCL 25 MG/ML IJ SOLN: 6.2500 mg | INTRAMUSCULAR | Status: DC | PRN

## 2011-03-06 MED ORDER — CEFAZOLIN SODIUM 1-5 GM-% IV SOLN: 1.0000 g | INTRAVENOUS | Status: DC

## 2011-03-06 MED ORDER — HEPARIN SOD (PORK) LOCK FLUSH 100 UNIT/ML IV SOLN
INTRAVENOUS | Status: AC
  Administered 2011-03-06: 500 [IU]
  Filled 2011-03-06: qty 5

## 2011-03-06 MED ORDER — HEPARIN SOD (PORK) LOCK FLUSH 100 UNIT/ML IV SOLN
500.0000 [IU] | INTRAVENOUS | Status: AC | PRN
  Administered 2011-03-06: 500 [IU]

## 2011-03-06 MED ORDER — LACTATED RINGERS IV SOLN
INTRAVENOUS | Status: DC | PRN
  Administered 2011-03-06: 07:00:00 via INTRAVENOUS

## 2011-03-06 MED ORDER — FENTANYL CITRATE 0.05 MG/ML IJ SOLN
INTRAMUSCULAR | Status: AC
  Filled 2011-03-06: qty 2

## 2011-03-06 MED ORDER — OXYCODONE-ACETAMINOPHEN 5-325 MG PO TABS
ORAL_TABLET | ORAL | Status: AC
  Administered 2011-03-06: 2 via ORAL
  Filled 2011-03-06: qty 2

## 2011-03-06 MED ORDER — OXYCODONE-ACETAMINOPHEN 5-325 MG PO TABS
1.0000 | ORAL_TABLET | ORAL | Status: DC | PRN
  Administered 2011-03-06: 2 via ORAL

## 2011-03-06 MED ORDER — CEPHALEXIN 500 MG PO CAPS: 500.0000 mg | ORAL_CAPSULE | Freq: Three times a day (TID) | ORAL | Status: DC

## 2011-03-06 MED ORDER — ROCURONIUM BROMIDE 100 MG/10ML IV SOLN
INTRAVENOUS | Status: DC | PRN
  Administered 2011-03-06: 30 mg via INTRAVENOUS

## 2011-03-06 SURGICAL SUPPLY — 46 items
ADH SKN CLS APL DERMABOND .7 (GAUZE/BANDAGES/DRESSINGS)
ANTNA NRSTM XTRN TELEM NS LF (UROLOGICAL SUPPLIES) ×1
APL SKNCLS STERI-STRIP NONHPOA (GAUZE/BANDAGES/DRESSINGS) ×1
BAG URINE LEG 500ML (DRAIN) IMPLANT
BANDAGE ADHESIVE 1X3 (GAUZE/BANDAGES/DRESSINGS) IMPLANT
BENZOIN TINCTURE PRP APPL 2/3 (GAUZE/BANDAGES/DRESSINGS) ×2 IMPLANT
BLADE HEX COATED 2.75 (ELECTRODE) ×2 IMPLANT
BLADE SURG 15 STRL LF DISP TIS (BLADE) ×1 IMPLANT
BLADE SURG 15 STRL SS (BLADE) ×1
CATH FOLEY 2WAY SLVR  5CC 16FR (CATHETERS)
CATH FOLEY 2WAY SLVR 5CC 16FR (CATHETERS) IMPLANT
CLOTH BEACON ORANGE TIMEOUT ST (SAFETY) ×2 IMPLANT
COVER MAYO STAND STRL (DRAPES) IMPLANT
COVER PROBE W GEL 5X96 (DRAPES) IMPLANT
DERMABOND ADVANCED (GAUZE/BANDAGES/DRESSINGS)
DERMABOND ADVANCED .7 DNX12 (GAUZE/BANDAGES/DRESSINGS) IMPLANT
DRAPE BACK TABLE (DRAPES) IMPLANT
DRAPE INCISE 23X17 IOBAN STRL (DRAPES)
DRAPE INCISE IOBAN 23X17 STRL (DRAPES) IMPLANT
DRAPE LAPAROSCOPIC ABDOMINAL (DRAPES) ×2 IMPLANT
DRESSING TELFA 8X3 (GAUZE/BANDAGES/DRESSINGS) IMPLANT
DRSG TEGADERM 4X4.75 (GAUZE/BANDAGES/DRESSINGS) ×2 IMPLANT
ELECT REM PT RETURN 9FT ADLT (ELECTROSURGICAL) ×2
ELECTRODE REM PT RTRN 9FT ADLT (ELECTROSURGICAL) ×1 IMPLANT
GLOVE BIO SURGEON STRL SZ7.5 (GLOVE) ×2 IMPLANT
GOWN PREVENTION PLUS LG XLONG (DISPOSABLE) ×2 IMPLANT
GOWN STRL REIN XL XLG (GOWN DISPOSABLE) ×2 IMPLANT
INTRODUCER GUIDE DILATR SHEATH (SET/KITS/TRAYS/PACK) IMPLANT
KIT BASIN OR (CUSTOM PROCEDURE TRAY) ×2 IMPLANT
NEEDLE FORAMEN 20GA 3.5  9CM (NEEDLE) IMPLANT
NEEDLE FORAMEN 20GA 5  12.5CM (NEEDLE) IMPLANT
NEEDLE HYPO 22GX1.5 SAFETY (NEEDLE) IMPLANT
PAD TELFA 2X3 NADH STRL (GAUZE/BANDAGES/DRESSINGS) ×2 IMPLANT
PENCIL BUTTON HOLSTER BLD 10FT (ELECTRODE) IMPLANT
PROGRAMMER ANTENNA EXT (UROLOGICAL SUPPLIES) ×2 IMPLANT
PROGRAMMER STIMUL 2.2X1.1X3.7 (UROLOGICAL SUPPLIES) ×2 IMPLANT
SPONGE GAUZE 4X4 12PLY (GAUZE/BANDAGES/DRESSINGS) IMPLANT
STAPLER VISISTAT 35W (STAPLE) IMPLANT
STIMULATOR INTERSTIM 2X1.7X.3 (Orthopedic Implant) ×2 IMPLANT
STRIP CLOSURE SKIN 1/2X4 (GAUZE/BANDAGES/DRESSINGS) ×4 IMPLANT
SUT VIC AB 3-0 SH 27 (SUTURE) ×2
SUT VIC AB 3-0 SH 27X BRD (SUTURE) ×1 IMPLANT
SUT VICRYL RAPIDE 4/0 PS 2 (SUTURE) ×2 IMPLANT
SYR BULB IRRIGATION 50ML (SYRINGE) ×2 IMPLANT
SYR CONTROL 10ML LL (SYRINGE) IMPLANT
TOWEL OR 17X26 10 PK STRL BLUE (TOWEL DISPOSABLE) ×2 IMPLANT

## 2011-03-06 NOTE — Progress Notes (Signed)
Pt redeived received i PACU receiving  IV  Fluid viaa r chest central line.  Fluids placed on pump at  100 mls/hr.  [employer] rep paged  fpr stimulator and pacer.
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
## 2011-03-06 NOTE — Anesthesia Postprocedure Evaluation (Signed)
Anesthesia Post Note  Patient: [name]  Procedure(s) Performed:  REVISION OF INTERSTIM - Replacement of Neurostimulator  Anesthesia type: General  Patient location: PACU  Post pain: Pain level controlled  Post assessment: Post-op Vital signs reviewed  Last Vitals:  Filed Vitals:   03/06/11 1015  BP: 90/48  Pulse: 71  Temp:   Resp: 8    Post vital signs: Reviewed  Level of consciousness: sedated  Complications: No apparent anesthesia complications

## 2011-03-06 NOTE — Interval H&P Note (Signed)
History and Physical Interval Note:  03/06/2011 7:18 AM  [name]  has presented today for surgery, with the diagnosis of Urge Incontinence  The various methods of treatment have been discussed with the patient and family. After consideration of risks, benefits and other options for treatment, the patient has consented to  Procedure(s): REVISION OF INTERSTIM as a surgical intervention .  The patients' history has been reviewed, patient examined, no change in status, stable for surgery.  I have reviewed the patients' chart and labs.  Questions were answered to the patient's satisfaction.     Trung Wenzl A

## 2011-03-06 NOTE — Progress Notes (Signed)
Pt  Has med tronic box   With  Her.  Pt has talked with  Dr Sherron Monday

## 2011-03-06 NOTE — Transfer of Care (Signed)
Immediate Anesthesia Transfer of Care Note  Patient: [name]  Procedure(s) Performed:  REVISION OF INTERSTIM - Replacement of Neurostimulator  Patient Location: PACU  Anesthesia Type: General  Level of Consciousness: awake and alert   Airway & Oxygen Therapy: Patient Spontanous Breathing and Patient connected to face mask oxygen  Post-op Assessment: Report given to PACU RN and Post -op Vital signs reviewed and stable  Post vital signs: Reviewed and stable  Complications: No apparent anesthesia complications

## 2011-03-06 NOTE — Op Note (Signed)
Operative diagnosis: Refractory urge incontinence and malfunctioning InterStim   Post operative diagnosis: Refractory incontinence and malfunctioning InterStim Surgery: Replacement of InterStim generator  Surgeon: Lorin Picket Zanovia Rotz  The patienmt has the above diagnoses.She constented to the above procedure  Ten minute scrub. Preop antibiotics were given. Patient was in prone position.  I opened her original left buttuck incision and opened the pseudo capsule delivering the generator.I disconnect the generator from the lead with the srew driver. The dry clean lead was placed in the new generator and placed back in the capsule.  I did a tension free closure with 3-0 vicryl followed by 4-0 for the skin.  Dressing was applied

## 2011-03-06 NOTE — Progress Notes (Signed)
Patient arrives to short stay for Phase II of PACU.  Port a cath has been accessed for surgery. RN received verbal order to flush PAC. No documentation available prior to arrival to Phase II. Huber needle discontinued intact and sterile dressing applied. Good blood return before PAC flushed.

## 2011-03-06 NOTE — Anesthesia Preprocedure Evaluation (Addendum)
Anesthesia Evaluation  Patient identified by MRN, date of birth, ID band Patient awake    Reviewed: Allergy & Precautions, H&P , NPO status , Patient's Chart, lab work & pertinent test results  History of Anesthesia Complications (+) DIFFICULT IV STICK / SPECIAL LINENegative for: history of anesthetic complications  Airway Mallampati: II TM Distance: >3 FB     Dental  (+) Teeth Intact and Dental Advisory Given   Pulmonary neg pulmonary ROS, pneumonia ,  oxygen dependent,  clear to auscultation  Pulmonary exam normal       Cardiovascular hypertension, Pt. on medications + CAD and +CHF + dysrhythmias Atrial Fibrillation + pacemaker Regular Normal    Neuro/Psych PSYCHIATRIC DISORDERS  Neuromuscular disease Negative Neurological ROS  Negative Psych ROS   GI/Hepatic Neg liver ROS, GERD-  Medicated and Controlled,(+) Hepatitis -, Unspecified  Endo/Other  Negative Endocrine ROS  Renal/GU negative Renal ROS  Genitourinary negative   Musculoskeletal negative musculoskeletal ROS (+) Arthritis -, Osteoarthritis,  Fibromyalgia -, narcotic dependent  Abdominal   Peds negative pediatric ROS (+)  Hematology negative hematology ROS (+)   Anesthesia Other Findings   Reproductive/Obstetrics negative OB ROS                          Anesthesia Physical Anesthesia Plan  ASA: III  Anesthesia Plan: General   Post-op Pain Management:    Induction: Intravenous  Airway Management Planned: Oral ETT  Additional Equipment:   Intra-op Plan:   Post-operative Plan: Extubation in OR  Informed Consent: I have reviewed the patients History and Physical, chart, labs and discussed the procedure including the risks, benefits and alternatives for the proposed anesthesia with the patient or authorized representative who has indicated his/her understanding and acceptance.   Dental advisory given  Plan Discussed with:  CRNA  Anesthesia Plan Comments:         Anesthesia Quick Evaluation

## 2011-03-06 NOTE — Anesthesia Procedure Notes (Signed)
Procedures

## 2011-03-07 ENCOUNTER — Encounter: Payer: Self-pay | Admitting: Internal Medicine

## 2011-03-08 ENCOUNTER — Encounter (HOSPITAL_COMMUNITY): Payer: Self-pay | Admitting: Urology

## 2011-03-12 ENCOUNTER — Encounter: Payer: Self-pay | Admitting: Internal Medicine

## 2011-03-14 ENCOUNTER — Ambulatory Visit (INDEPENDENT_AMBULATORY_CARE_PROVIDER_SITE_OTHER)
Admission: RE | Admit: 2011-03-14 | Discharge: 2011-03-14 | Disposition: A | Discharge status: Home / Self Care | Payer: Medicare Other | Source: Ambulatory Visit | Attending: Cardiology | Admitting: Cardiology

## 2011-03-14 ENCOUNTER — Ambulatory Visit (INDEPENDENT_AMBULATORY_CARE_PROVIDER_SITE_OTHER): Payer: Medicare Other | Admitting: Cardiology

## 2011-03-14 ENCOUNTER — Encounter: Payer: Self-pay | Admitting: Cardiology

## 2011-03-14 ENCOUNTER — Telehealth: Payer: Self-pay | Admitting: *Deleted

## 2011-03-14 ENCOUNTER — Telehealth: Payer: Self-pay | Admitting: Pulmonary Disease

## 2011-03-14 DIAGNOSIS — I5022 Chronic systolic (congestive) heart failure: Secondary | ICD-10-CM

## 2011-03-14 DIAGNOSIS — I10 Essential (primary) hypertension: Secondary | ICD-10-CM

## 2011-03-14 DIAGNOSIS — R05 Cough: Secondary | ICD-10-CM

## 2011-03-14 DIAGNOSIS — R059 Cough, unspecified: Secondary | ICD-10-CM

## 2011-03-14 DIAGNOSIS — I4891 Unspecified atrial fibrillation: Secondary | ICD-10-CM

## 2011-03-14 DIAGNOSIS — J189 Pneumonia, unspecified organism: Secondary | ICD-10-CM

## 2011-03-14 MED ORDER — NITROGLYCERIN 0.4 MG SL SUBL: 0.4000 mg | SUBLINGUAL_TABLET | SUBLINGUAL | Status: DC | PRN

## 2011-03-14 NOTE — Assessment & Plan Note (Signed)
She seems to be euvolemic.  At this point. She will continue the meds as listed.

## 2011-03-14 NOTE — Assessment & Plan Note (Signed)
She apparently is maintaining NSR. I will check a TSH and liver profile.

## 2011-03-14 NOTE — Patient Instructions (Signed)
Your physician recommends that you schedule a follow-up appointment in: 2 MONTHS WITH DR [facility]  A chest x-ray takes a picture of the organs and structures inside the chest, including the heart, lungs, and blood vessels. This test can show several things, including, whether the heart is enlarges; whether fluid is building up in the lungs; and whether pacemaker / defibrillator leads are still in place. AT [address] OFFICE  FOLLOW UP WITH DR SOOD FOR COUGH POSS RECURRENT PNEUMONIA

## 2011-03-14 NOTE — Assessment & Plan Note (Signed)
She has recurrent cough and some posterior chest discomfort. There is some dullness to percussion on her right lower chest posteriorly. I will check a two-view chest x-ray and refer her back to see Dr. Craige Cotta.

## 2011-03-14 NOTE — Progress Notes (Signed)
HPI She presents for followup of a nonischemic cardiomyopathy. She presents as a new patient for me having previously been treated by Dr.Tennant.  As a history of a nonischemic cardiomyopathy though her last ejection fraction was 40-50%. She has a CRT. She has multiple medical problems and currently actually is receiving TPN. Despite this she's not having any overt cardiovascular symptoms. She denies any PND or orthopnea though she's chronically slept in an easy chair. She's not having any anterior chest discomfort though she has some discomfort in her right posterior chest. She's had a cough productive of phlegm though she can't describe it. She's not had any fevers or chills. She has had chronic dyspnea and is on home O2 continuously. She's not describing this to be worse. She has had some "muscle twitching". She describes this as occasional involuntary movements of a muscle in her leg or her arms. She doesn't describe cramping. She's not having any other focal neurologic symptoms.   Allergies  Allergen Reactions  . Pradaxa Other (See Comments)    LOW BLOOD COUNT  . Sulfonamide Derivatives Other (See Comments)    SHAKE   . Talwin Other (See Comments)    hallucination    Current Outpatient Prescriptions  Medication Sig Dispense Refill  . amiodarone (PACERONE) 200 MG tablet Take 200 mg by mouth every morning.       . carvedilol (COREG) 6.25 MG tablet Take 1 tablet (6.25 mg total) by mouth 2 (two) times daily with a meal.  60 tablet  4  . cyclobenzaprine (FLEXERIL) 10 MG tablet Take 10 mg by mouth 3 (three) times daily as needed. MUSCLE SPASM       . denosumab (PROLIA) 60 MG/ML SOLN Inject 60 mg into the skin every 6 (six) months. Twice a year      . etodolac (LODINE) 400 MG tablet Take 400 mg by mouth every morning.       . fentaNYL (DURAGESIC - DOSED MCG/HR) 50 MCG/HR Place 1 patch (50 mcg total) onto the skin every 3 (three) days.  10 patch  0  . furosemide (LASIX) 20 MG tablet Take 20  mg by mouth every morning.       . gabapentin (NEURONTIN) 600 MG tablet TAKE 1 TABLET 4 TIMES DAILY  120 tablet  5  . iron dextran complex (INFED) 50 MG/ML injection Please give infed infusion (no test dose needed) over 4 hours per pharmacy calculated dose. Ht: 5'2 Wt: 99 pounds Hgb: 12  1 mL  0  . nitrofurantoin, macrocrystal-monohydrate, (MACROBID) 100 MG capsule Take 100 mg by mouth 2 (two) times daily.       . nitroGLYCERIN (NITROSTAT) 0.4 MG SL tablet Place 0.4 mg under the tongue every 5 (five) minutes as needed. CHEST PAIN       . Nutritional Supplements (RESOURCE BREEZE PO) 2 (two) times daily.       [name] oxyCODONE-acetaminophen (PERCOCET) 10-325 MG per tablet Take 1 tablet by mouth every 4 (four) hours as needed. PAIN       . pantoprazole (PROTONIX) 40 MG tablet Take 40 mg by mouth every morning.       . predniSONE (DELTASONE) 20 MG tablet Take 20 mg by mouth every morning.       . promethazine (PHENERGAN) 25 MG tablet Take 25 mg by mouth every 6 (six) hours as needed. NAUSEA       . temazepam (RESTORIL) 15 MG capsule 30 mg at bedtime.       [name]  tiotropium (SPIRIVA HANDIHALER) 18 MCG inhalation capsule Place 18 mcg into inhaler and inhale every morning.       . TPN ADULT Inject into the vein every 12 (twelve) hours. tpn 700 pm to 0700 am daily via right chest port a cath       Current Facility-Administered Medications  Medication Dose Route Frequency Provider Last Rate Last Dose  . cyanocobalamin ((VITAMIN B-12)) injection 1,000 mcg  1,000 mcg Intramuscular Q30 days Hart Carwin, MD   1,000 mcg at 02/26/11 1405    Past Medical History  Diagnosis Date  . Small bowel obstruction   . HTN (hypertension)   . Systolic CHF, chronic   . GERD (gastroesophageal reflux disease)   . CAD (coronary artery disease)     mild disease per cath in 2008  . LBBB (left bundle branch block)   . Atrial fibrillation     on amiodarone  . Venous embolism and thrombosis of subclavian vein     after  pacemaker insertion Oct 2011  . Diverticulosis   . COPD (chronic obstructive pulmonary disease)     -06/28/08 PFT FEV1 1.95 (97%), FVC 2.79 (101%), FEV1% 70, TLC 3.88 (87%), DLCO 64%  . Pleural effusion      s/p right thoracentesis 03/09  . Small bowel obstruction   . DDD (degenerative disc disease)   . Urinary incontinence     has pacemaker stimulator in her bladder  . RLS (restless legs syndrome)   . Primary dilated cardiomyopathy     EF 45 to 50% per echo in Jan 2012  . Vitamin B12 deficiency   . Gastroparesis   . Chronic nausea   . Chronic abdominal pain   . Chronic pain syndrome   . Gastrocutaneous fistula   . Pacemaker     CRT therapy; followed by Dr. Graciela Husbands  . H/O: GI bleed     from Pradaxa  . Oxygen dependent     2 liters via [place] all the time  . CHF (congestive heart failure)   . Pneumonia - 03/09, 12/09, 02/10, 06/10  . Anemia   . Fibromyalgia   . Fibromyalgia   . Hx of stress fx aug 2011    right hip   . Elbow fracture, left aug 2012    Past Surgical History  Procedure Date  . Appendectomy   . Bladder surgery   . Total abdominal hysterectomy   . Pacemaker placement     CRT-PPM-Medtronic in October 2011  . Peg placement 2012  . Abdominal adhesion surgery     numerous surgeries, last done 2011  . Peg removed     with complications  . Tonsillectomy age 76  . Colectomy     Intestinal resection (5times),S/P PARTIAL COLECTOMY 2001  . Cardiac catheterization 2008  . Interstim implant revision 03/06/2011    Procedure: REVISION OF Leane Platt;  Surgeon: Martina Sinner, MD;  Location: [facility];  Service: Urology;  Laterality: N/A;  Replacement of Neurostimulator    ROS: As stated in the HPI and negative for all other systems.  PHYSICAL EXAM BP 98/50  Pulse 72  Ht 5\' 2"  (1.575 m)  Wt 105 lb (47.628 kg)  BMI 19.20 kg/m2 GENERAL:  Well appearing HEENT:  Pupils equal round and reactive, fundi not visualized, oral mucosa unremarkable NECK:  No jugular venous  distention, waveform within normal limits, carotid upstroke brisk and symmetric, no bruits, no thyromegaly LYMPHATICS:  No cervical, inguinal adenopathy LUNGS:  Clear to auscultation bilaterally BACK:  No CVA tenderness CHEST:  Port a cath, CRT pocket HEART:  PMI not displaced or sustained,S1 and S2 within normal limits, no S3, no S4, no clicks, no rubs, no murmurs ABD:  Flat, positive bowel sounds normal in frequency in pitch, no bruits, no rebound, no guarding, no midline pulsatile mass, no hepatomegaly, no splenomegaly EXT:  2 plus pulses throughout, no edema, no cyanosis no clubbing SKIN:  No rashes no nodules, bruising and mild edema NEURO:  Cranial nerves II through XII grossly intact, motor grossly intact throughout PSYCH:  Cognitively intact, oriented to person place and time  EKG:  Sinus with ventricular pacing 100% capture, rate 72.  .03/14/2011  ASSESSMENT AND PLAN

## 2011-03-14 NOTE — Telephone Encounter (Signed)
Left message for pt to call, per dr hochrein the pt will need to start avelox 400 mg daily for 7 days due to pneumonia on cxr done today. Dr hochrein spoke with the pulmonary dept and the pt will be seen this week.

## 2011-03-14 NOTE — Telephone Encounter (Signed)
Spoke to dr hochrein - OV with me or other MD in 1-2 days Started on avelox OK to double book

## 2011-03-14 NOTE — Assessment & Plan Note (Signed)
Her blood pressure runs low and she tolerates this.  I will continue the meds as listed.

## 2011-03-15 ENCOUNTER — Telehealth: Payer: Self-pay | Admitting: Gastroenterology

## 2011-03-15 MED ORDER — MOXIFLOXACIN HCL 400 MG PO TABS: 400.0000 mg | ORAL_TABLET | Freq: Every day | ORAL | Status: AC

## 2011-03-15 NOTE — Telephone Encounter (Signed)
Pt has already been scheduled to see RA 03/16/11

## 2011-03-15 NOTE — Telephone Encounter (Signed)
Spoke with pt, aware of cxr results. meds called to pharm

## 2011-03-15 NOTE — Telephone Encounter (Signed)
STAT labs were drawn tonight at about 7pm.  Hb 8.5.  Albumin 1.4.  I will forward the results to Dr. Juanda Chance; [facility] lab tech who called said Dr. Juanda Chance ordered the labs however I don't see any recent documentation about need for stat labs for the patient.  [facility] also had her birthday written differently and so there was quite a lot of confusion trying to decide what patient they were calling about.

## 2011-03-16 ENCOUNTER — Ambulatory Visit (INDEPENDENT_AMBULATORY_CARE_PROVIDER_SITE_OTHER): Payer: Medicare Other | Admitting: Pulmonary Disease

## 2011-03-16 ENCOUNTER — Other Ambulatory Visit: Payer: Self-pay | Admitting: Internal Medicine

## 2011-03-16 ENCOUNTER — Encounter: Payer: Medicare Other | Admitting: Cardiology

## 2011-03-16 ENCOUNTER — Encounter: Payer: Self-pay | Admitting: Internal Medicine

## 2011-03-16 ENCOUNTER — Telehealth: Payer: Self-pay | Admitting: Internal Medicine

## 2011-03-16 ENCOUNTER — Encounter: Payer: Self-pay | Admitting: Pulmonary Disease

## 2011-03-16 VITALS — BP 126/74 | HR 76 | Temp 98.4°F | Ht 62.0 in | Wt 105.0 lb

## 2011-03-16 DIAGNOSIS — J189 Pneumonia, unspecified organism: Secondary | ICD-10-CM

## 2011-03-16 MED ORDER — PREDNISONE 10 MG PO TABS: ORAL_TABLET | ORAL | Status: DC

## 2011-03-16 NOTE — Telephone Encounter (Signed)
I agree with restarting Prednisone at 15 mg/day, continue TPN and achedule Transfusion 2 u PRBC's

## 2011-03-16 NOTE — Telephone Encounter (Signed)
Per Dr Juanda Chance, patient needs to continue TPN. In fact, we have called and asked to see if we could revert back to the original order for TPN due to patient's falling prealbumin and albumin level. I have spoken to [place] at [place] who states that they have already gone back to the original order. She states that she will speak to the dietician and have them contact our office ASAP with recommendations. Dr Juanda Chance has also asked that patient be set up for a blood transfusion of 2 units PRBC. She would also like patient to go back on prednisone (if not already on prednisone) 15 mg daily and schedule an office visit for 03/21/11. Blood transfusion has been scheduled at [facility] for 03/22/11 @ 7:30 am and office visit has been scheduled for 03/21/11 @ 3:00 pm. I have advised patient of falling prealbumin, albumin and hemoglobin as well as the recommendations to correct those. Patient verbalizes understanding of all of the above. She does state that she "ran out of Prednisone" so she has not been taking it and stopped it rather abruptly. I have advised her that we will start her back on Prednisone 15 mg daily for now.

## 2011-03-16 NOTE — Progress Notes (Signed)
Addended by: Deeann Saint on: 03/16/2011 05:35 PM   Modules accepted: Orders

## 2011-03-16 NOTE — Telephone Encounter (Signed)
Patient is calling because she has been on the TPN for several more months and is wondering if Dr. Juanda Chance will let her try stopping it and see how she will do. Please, advise.

## 2011-03-16 NOTE — Assessment & Plan Note (Signed)
Complete avelox x 14 ds Chest xray next week Please have your nurse send me CBC results Defer to Dr Craige Cotta - should we repeat swalllow evaln - she is considering stopping TPN - will d/w dr Juanda Chance

## 2011-03-16 NOTE — Progress Notes (Signed)
  Subjective:    Patient ID: [name], female    DOB: 1945-12-01, 64 y.o.   MRN: [id number]  HPI PCP - Laury [facility]on [facility] - [facility] - Juanda Chance  65 yo female with history of COPD/emphysema, chronic hypoxemic respiratory failure, and recurrent pneumonia -aspiration suspected. She states swallow evaln was nml - I could not find this record. Last abx -levaquin 9/12. She has a non ischemic cardiomyopathy &  Is on TPN for chronic malnutrition & ? Intestina fistula via rt portacath. Wt of 105 lbs is the best We were asked by Dr hochrein to work her in due to finding of RLL infiltrate on CXR - on avelox since 12/27 C/o cough - does not look at color, no fever    Review of Systems Patient denies significant dyspnea,cough, hemoptysis,  chest pain, palpitations, pedal edema, orthopnea, paroxysmal nocturnal dyspnea, lightheadedness, nausea, vomiting, abdominal or  leg pains      Objective:   Physical Exam  Gen. Pleasant, thin, in no distress ENT - no lesions, no post nasal drip Neck: No JVD, no thyromegaly, no carotid bruits Lungs: no use of accessory muscles, no dullness to percussion, RLL rales, no rhonchi  Cardiovascular: Rhythm regular, heart sounds  normal, no murmurs or gallops, no peripheral edema Musculoskeletal: No deformities, no cyanosis or clubbing        Assessment & Plan:

## 2011-03-16 NOTE — Patient Instructions (Signed)
Complete avelox x 14 ds Chest xray next week Please have your nurse send me CBC results

## 2011-03-19 ENCOUNTER — Telehealth: Payer: Self-pay | Admitting: *Deleted

## 2011-03-19 NOTE — Telephone Encounter (Signed)
I have spoken to [name], dietician at [facility] regarding patient's TPN dosing (see previous phone note from 03/16/11). She states that actually, they did NOT go back to the original order and that they can do that. However, she is on a higher amount of protein now (1.5) than she was originally (at 1.2) so she will leave her on a higher dose of protein and increase lipids as well. She will send orders over to be signed by Dr Juanda Chance. Vernona Rieger states that she spoke with the patient and the patient "doesnt want to gain any weight." I have advised her that the patient's lab levels are not where we would like them to be and she weighs 105 pounds which is not overweight, therefore, we are fine with her gaining some weight it it means keeping her lab levels within normal range. She verbalizes understanding.

## 2011-03-21 ENCOUNTER — Encounter (HOSPITAL_COMMUNITY): Admission: RE | Admit: 2011-03-21 | Payer: Medicare Other | Source: Ambulatory Visit

## 2011-03-21 ENCOUNTER — Ambulatory Visit (INDEPENDENT_AMBULATORY_CARE_PROVIDER_SITE_OTHER): Payer: Medicare Other | Admitting: Internal Medicine

## 2011-03-21 ENCOUNTER — Encounter: Payer: Self-pay | Admitting: Internal Medicine

## 2011-03-21 ENCOUNTER — Ambulatory Visit (HOSPITAL_COMMUNITY)
Admission: RE | Admit: 2011-03-21 | Discharge: 2011-03-21 | Disposition: A | Discharge status: Home / Self Care | Payer: Medicare Other | Source: Ambulatory Visit | Attending: Internal Medicine | Admitting: Internal Medicine

## 2011-03-21 VITALS — BP 100/62 | HR 70 | Ht 62.0 in | Wt 105.0 lb

## 2011-03-21 DIAGNOSIS — R633 Feeding difficulties, unspecified: Secondary | ICD-10-CM

## 2011-03-21 DIAGNOSIS — D649 Anemia, unspecified: Secondary | ICD-10-CM | POA: Insufficient documentation

## 2011-03-21 DIAGNOSIS — K56609 Unspecified intestinal obstruction, unspecified as to partial versus complete obstruction: Secondary | ICD-10-CM

## 2011-03-21 DIAGNOSIS — D509 Iron deficiency anemia, unspecified: Secondary | ICD-10-CM

## 2011-03-21 LAB — PREPARE RBC (CROSSMATCH)

## 2011-03-21 NOTE — Progress Notes (Signed)
[name] 1945-09-16 MRN [id number]   History of Present Illness:  This is a 66 year old white female with chronic small bowel obstruction and history of gastrocutaneous fistula post percutaneous gastrostomy removal. Her fistula has been closed  for 2 months. She has failure to thrive and is supported with home TNA via Port-A-Cath. Her serum pre albumin has recently dropped to less than 8 as a result of cutting back on her TPN because of abnormal liver function tests. She has chronic GI blood loss due to ileal inflammation at 70 cm from the rectum. She is status post a subtotal colectomy with 20 cm of colon left. There is a stricture and inflammation at 70 cm. Biopsies are nonspecific. She was started on prednisone 20 mg a day 6 weeks ago resulting in improvement of her diarrhea and a general weight gain of 5 pounds. Her last surgery was an exploratory laparotomy in December 2011 by Dr Dwain Sarna. It  was unsuccessful as far as relieving the intestinal obstruction due to dense adhesions and risk of perforation. Dr Dwain Sarna placed a surgical gastrostomy at that time for decompression. She has COPD and oxygen-dependent respiratory failure. She was recently seen by  Dr Vassie Loll in Dr Evlyn Courier absence. He wonders if the patient may be aspirating during her episodes of obstruction. She also has heart disease followed by cardiology. She denies recent vomiting. She claims to be eating more.   Past Medical History  Diagnosis Date  . Small bowel obstruction   . HTN (hypertension)   . Systolic CHF, chronic   . GERD (gastroesophageal reflux disease)   . CAD (coronary artery disease)     mild disease per cath in 2008  . LBBB (left bundle branch block)   . Campath-induced atrial fibrillation     on amiodarone  . Venous embolism and thrombosis of subclavian vein     after pacemaker insertion Oct 2011  . Diverticulosis   . COPD (chronic obstructive pulmonary disease)     -06/28/08 PFT FEV1 1.95 (97%), FVC  2.79 (101%), FEV1% 70, TLC 3.88 (87%), DLCO 64%  . Pleural effusion      s/p right thoracentesis 03/09  . Small bowel obstruction   . DDD (degenerative disc disease)   . Urinary incontinence     has pacemaker stimulator in her bladder  . RLS (restless legs syndrome)   . Primary dilated cardiomyopathy     EF 45 to 50% per echo in Jan 2012  . Vitamin B12 deficiency   . Gastroparesis   . Chronic nausea   . Chronic abdominal pain   . Chronic pain syndrome   . Gastrocutaneous fistula   . Pacemaker     CRT therapy; followed by Dr. Graciela Husbands  . H/O: GI bleed     from Pradaxa  . Oxygen dependent     2 liters via [place] all the time  . CHF (congestive heart failure)   . Pneumonia - 03/09, 12/09, 02/10, 06/10  . Anemia   . Fibromyalgia   . Fibromyalgia   . Hx of stress fx aug 2011    right hip   . Elbow fracture, left aug 2012   Past Surgical History  Procedure Date  . Appendectomy   . Bladder surgery   . Total abdominal hysterectomy   . Pacemaker placement     CRT-PPM-Medtronic in October 2011  . Peg placement 2012  . Abdominal adhesion surgery     numerous surgeries, last done 2011  . Peg  removed     with complications  . Tonsillectomy age 25  . Colectomy     Intestinal resection (5times),S/P PARTIAL COLECTOMY 2001  . Cardiac catheterization 2008  . Interstim implant revision 03/06/2011    Procedure: REVISION OF Leane Platt;  Surgeon: Martina Sinner, MD;  Location: [facility];  Service: Urology;  Laterality: N/A;  Replacement of Neurostimulator    reports that she quit smoking about 25 years ago. Her smoking use included Cigarettes. She has a 20 pack-year smoking history. She has never used smokeless tobacco. She reports that she does not drink alcohol or use illicit drugs. family history includes Breast cancer in her maternal aunt; Diabetes in her paternal grandfather; Heart disease in her brother, father, mother, and paternal aunt; and Stroke in her mother.  There is no history of  Colon cancer. Allergies  Allergen Reactions  . Pradaxa Other (See Comments)    LOW BLOOD COUNT  . Sulfonamide Derivatives Other (See Comments)    SHAKE   . Talwin Other (See Comments)    hallucination        Review of Systems: Denies dysphagia choking odynophagia or coughing when she eats. She has low-back pain and arthralgias  The remainder of the 10 point ROS is negative except as outlined in H&P   Physical Exam: General appearance Portable O2 via nasal cannula, in no distress. Chronically ill-appearing Eyes- non icteric. HEENT nontraumatic, normocephalic. Mouth no lesions, tongue papillated, no cheilosis. Neck supple without adenopathy, thyroid not enlarged, no carotid bruits, no JVD. Lungs Clear to auscultation bilaterally. Decreased breath sounds nasal oxygen 2 L a minute Cor normal S1, normal S2, regular rhythm, no murmur,  quiet precordium. Abdomen: Complete healing of the p status gastrostomy site. Normoactive bowel sounds. Rectal:not repeated. Extremities no pedal edema. Skin no lesions. Neurological alert and oriented x 3. Psychological normal mood and affect.  Assessment and Plan:   Problem #1 Chronic low-grade small bowel obstruction due to adhesive disease. A colonoscopy showed a distal ileal stricture. Patient is clinically improved on low-dose steroids. She will continue a prednisone taper from 20 mg to 15 mg every 4 weeks then 10 mg for 4 weeks then 5 mg for 4 weeks. I will see her in 8 weeks.  Problem #2 Anemia due to chronic disease as well as iron deficiency and chronic low grade blood loss. She is status post prior iron infusions and oral iron supplements. She is scheduled for a blood transfusion tomorrow.  Problem #3 Failure to thrive supported by central TNA. We have increased her caloric intake in an effort to increase her visceral protein measured by prealbumin. I told her that at the moment there is no way we can discontinue the TNA since it is  maintaining her nutritional status.  Problem #4 Possible  Aspiration, questioned by her pulmonary doctors, which is possible. At the times of small bowel obstruction, patient has had intractable vomiting. Unfortunately not much can be done surgically  due to extensive adhesions and failed recent attempts at exploratory laparotomy. I would also discourage reinstitution of the gastrostomy even if just for decompression due to various problems with PEG leakage and later gastrocutaneous fistula following removal of the gastrostomy tube. 03/21/2011 [name]

## 2011-03-21 NOTE — Patient Instructions (Signed)
Dr Craige Cotta, Dr Laury Axon, Dr Dwain Sarna

## 2011-03-21 NOTE — Telephone Encounter (Signed)
I agree with the plans to go to the original cTNA orders in ordet to provide higher caloric intake.

## 2011-03-22 ENCOUNTER — Other Ambulatory Visit: Payer: Self-pay | Admitting: Urology

## 2011-03-22 ENCOUNTER — Encounter (HOSPITAL_COMMUNITY): Payer: Self-pay

## 2011-03-22 ENCOUNTER — Other Ambulatory Visit (HOSPITAL_COMMUNITY): Payer: Self-pay | Admitting: *Deleted

## 2011-03-22 ENCOUNTER — Encounter (HOSPITAL_COMMUNITY)
Admission: RE | Admit: 2011-03-22 | Discharge: 2011-03-22 | Disposition: A | Discharge status: Home / Self Care | Payer: Medicare Other | Source: Ambulatory Visit | Attending: Internal Medicine | Admitting: Internal Medicine

## 2011-03-22 ENCOUNTER — Other Ambulatory Visit: Payer: Self-pay | Admitting: Internal Medicine

## 2011-03-22 DIAGNOSIS — R6251 Failure to thrive (child): Secondary | ICD-10-CM

## 2011-03-22 DIAGNOSIS — D649 Anemia, unspecified: Secondary | ICD-10-CM

## 2011-03-22 MED ORDER — HEPARIN SOD (PORK) LOCK FLUSH 100 UNIT/ML IV SOLN
500.0000 [IU] | INTRAVENOUS | Status: AC | PRN
  Administered 2011-03-22: 500 [IU]

## 2011-03-22 MED ORDER — SODIUM CHLORIDE 0.9 % IV SOLN: INTRAVENOUS | Status: DC

## 2011-03-23 ENCOUNTER — Ambulatory Visit: Payer: Medicare Other | Admitting: Internal Medicine

## 2011-03-23 LAB — TYPE AND SCREEN
ABO/RH(D): A NEG
Antibody Screen: NEGATIVE
Unit division: 0
Unit division: 0

## 2011-03-23 MED FILL — Heparin Sodium (Porcine) Lock Flush IV Soln 100 Unit/ML: INTRAVENOUS | Qty: 5 | Status: AC

## 2011-03-26 ENCOUNTER — Telehealth: Payer: Self-pay | Admitting: Family Medicine

## 2011-03-26 ENCOUNTER — Other Ambulatory Visit: Payer: Self-pay | Admitting: Physician Assistant

## 2011-03-26 ENCOUNTER — Other Ambulatory Visit: Payer: Self-pay | Admitting: Family Medicine

## 2011-03-26 ENCOUNTER — Encounter (HOSPITAL_COMMUNITY): Payer: Medicare Other

## 2011-03-26 DIAGNOSIS — R52 Pain, unspecified: Secondary | ICD-10-CM

## 2011-03-26 MED ORDER — CYCLOBENZAPRINE HCL 10 MG PO TABS: 10.0000 mg | ORAL_TABLET | Freq: Three times a day (TID) | ORAL | Status: DC | PRN

## 2011-03-26 NOTE — Telephone Encounter (Signed)
Last seen 02/19/11 and filled 01/08/11. Please advise     KP

## 2011-03-26 NOTE — Telephone Encounter (Signed)
Ok to refill x1 each   

## 2011-03-26 NOTE — Telephone Encounter (Signed)
Patient states that she needs to pickup rx for her pain patch and oxycodone.

## 2011-03-26 NOTE — Telephone Encounter (Signed)
Last seen and filled 02/19/11. Please advise    KP

## 2011-03-27 ENCOUNTER — Ambulatory Visit (INDEPENDENT_AMBULATORY_CARE_PROVIDER_SITE_OTHER)
Admission: RE | Admit: 2011-03-27 | Discharge: 2011-03-27 | Disposition: A | Discharge status: Home / Self Care | Payer: Medicare Other | Source: Ambulatory Visit | Attending: Pulmonary Disease | Admitting: Pulmonary Disease

## 2011-03-27 ENCOUNTER — Telehealth: Payer: Self-pay | Admitting: Pulmonary Disease

## 2011-03-27 DIAGNOSIS — J189 Pneumonia, unspecified organism: Secondary | ICD-10-CM

## 2011-03-27 MED ORDER — FENTANYL 50 MCG/HR TD PT72: 1.0000 | MEDICATED_PATCH | TRANSDERMAL | Status: DC

## 2011-03-27 MED ORDER — OXYCODONE-ACETAMINOPHEN 10-325 MG PO TABS: 1.0000 | ORAL_TABLET | ORAL | Status: DC | PRN

## 2011-03-27 NOTE — Telephone Encounter (Signed)
Patient aware Rx ready for pick up.      KP 
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
## 2011-03-27 NOTE — Telephone Encounter (Signed)
I spoke with pt and advised her that her order was already placed in the system and can come by anytime today to have this done. She voiced her understanding and needed nothing further

## 2011-03-28 ENCOUNTER — Encounter: Payer: Self-pay | Admitting: Internal Medicine

## 2011-03-28 ENCOUNTER — Other Ambulatory Visit: Payer: Self-pay | Admitting: Family Medicine

## 2011-03-29 NOTE — Telephone Encounter (Signed)
Spoke with Roseanne Reno at the pharmacy and Rx was not received on the 7th---Re faxed    KP

## 2011-03-30 ENCOUNTER — Ambulatory Visit (INDEPENDENT_AMBULATORY_CARE_PROVIDER_SITE_OTHER): Payer: Medicare Other | Admitting: Internal Medicine

## 2011-03-30 ENCOUNTER — Encounter (HOSPITAL_COMMUNITY): Payer: Medicare Other

## 2011-03-30 DIAGNOSIS — E538 Deficiency of other specified B group vitamins: Secondary | ICD-10-CM

## 2011-04-02 ENCOUNTER — Encounter (HOSPITAL_BASED_OUTPATIENT_CLINIC_OR_DEPARTMENT_OTHER): Payer: Self-pay | Admitting: Anesthesiology

## 2011-04-02 NOTE — Progress Notes (Signed)
REVIEWED PT HX, RECENT CARDIAC NOTE, PULM. NOTE, CXR AND LABS. NOTED RECENT RECURRENT PNEUMONIA, PT HAS EMPHYSEMA AND DEPENDENT O2.  PAGED DR Leta Jungling.  HE REVIEWED PT CHART IN EPIC , STATED PT SHOULD BE DONE AT MAIN OR W/ REPEAT CXR AND ASSESSED BY MDA BEFORE IV INSERTED .   LM W/ PAM AT DR [facility] OFFICE.

## 2011-04-03 ENCOUNTER — Encounter (HOSPITAL_BASED_OUTPATIENT_CLINIC_OR_DEPARTMENT_OTHER): Admission: RE | Payer: Self-pay | Source: Ambulatory Visit

## 2011-04-03 ENCOUNTER — Encounter (HOSPITAL_COMMUNITY): Payer: Medicare Other

## 2011-04-03 ENCOUNTER — Ambulatory Visit (HOSPITAL_BASED_OUTPATIENT_CLINIC_OR_DEPARTMENT_OTHER): Admission: RE | Admit: 2011-04-03 | Payer: Medicare Other | Source: Ambulatory Visit | Admitting: Urology

## 2011-04-03 SURGERY — REVISION, SACRAL NERVE STIMULATOR, INTERSTIM
Anesthesia: General

## 2011-04-04 ENCOUNTER — Encounter: Payer: Self-pay | Admitting: Family Medicine

## 2011-04-04 ENCOUNTER — Ambulatory Visit (INDEPENDENT_AMBULATORY_CARE_PROVIDER_SITE_OTHER): Payer: Medicare Other | Admitting: Family Medicine

## 2011-04-04 VITALS — BP 110/68 | HR 107 | Temp 98.6°F | Wt 99.2 lb

## 2011-04-04 DIAGNOSIS — R296 Repeated falls: Secondary | ICD-10-CM

## 2011-04-04 DIAGNOSIS — R300 Dysuria: Secondary | ICD-10-CM

## 2011-04-04 DIAGNOSIS — J189 Pneumonia, unspecified organism: Secondary | ICD-10-CM

## 2011-04-04 DIAGNOSIS — R5381 Other malaise: Secondary | ICD-10-CM

## 2011-04-04 DIAGNOSIS — W19XXXA Unspecified fall, initial encounter: Secondary | ICD-10-CM

## 2011-04-04 DIAGNOSIS — R531 Weakness: Secondary | ICD-10-CM

## 2011-04-04 DIAGNOSIS — R5383 Other fatigue: Secondary | ICD-10-CM

## 2011-04-04 MED ORDER — MOXIFLOXACIN HCL 400 MG PO TABS: 400.0000 mg | ORAL_TABLET | Freq: Every day | ORAL | Status: AC

## 2011-04-04 MED ORDER — CEFTRIAXONE SODIUM 1 G IJ SOLR
2.0000 g | Freq: Once | INTRAMUSCULAR | Status: AC
  Administered 2011-04-04: 2 g via INTRAMUSCULAR

## 2011-04-04 NOTE — Patient Instructions (Signed)
Pneumonia, Adult Pneumonia is an infection of the lungs.  CAUSES Pneumonia may be caused by bacteria or a virus. Usually, these infections are caused by breathing infectious particles into the lungs (respiratory tract). SYMPTOMS   Cough.   Fever.   Chest pain.   Increased rate of breathing.   Wheezing.   Mucus production.  DIAGNOSIS  If you have the common symptoms of pneumonia, your caregiver will typically confirm the diagnosis with a chest X-ray. The X-ray will show an abnormality in the lung (pulmonary infiltrate) if you have pneumonia. Other tests of your blood, urine, or sputum may be done to find the specific cause of your pneumonia. Your caregiver may also do tests (blood gases or pulse oximetry) to see how well your lungs are working. TREATMENT  Some forms of pneumonia may be spread to other people when you cough or sneeze. You may be asked to wear a mask before and during your exam. Pneumonia that is caused by bacteria is treated with antibiotic medicine. Pneumonia that is caused by the influenza virus may be treated with an antiviral medicine. Most other viral infections must run their course. These infections will not respond to antibiotics.  PREVENTION A pneumococcal shot (vaccine) is available to prevent a common bacterial cause of pneumonia. This is usually suggested for:  People over 65 years old.   Patients on chemotherapy.   People with chronic lung problems, such as bronchitis or emphysema.   People with immune system problems.  If you are over 65 or have a high risk condition, you may receive the pneumococcal vaccine if you have not received it before. In some countries, a routine influenza vaccine is also recommended. This vaccine can help prevent some cases of pneumonia.You may be offered the influenza vaccine as part of your care. If you smoke, it is time to quit. You may receive instructions on how to stop smoking. Your caregiver can provide medicines and  counseling to help you quit. HOME CARE INSTRUCTIONS   Cough suppressants may be used if you are losing too much rest. However, coughing protects you by clearing your lungs. You should avoid using cough suppressants if you can.   Your caregiver may have prescribed medicine if he or she thinks your pneumonia is caused by a bacteria or influenza. Finish your medicine even if you start to feel better.   Your caregiver may also prescribe an expectorant. This loosens the mucus to be coughed up.   Only take over-the-counter or prescription medicines for pain, discomfort, or fever as directed by your caregiver.   Do not smoke. Smoking is a common cause of bronchitis and can contribute to pneumonia. If you are a smoker and continue to smoke, your cough may last several weeks after your pneumonia has cleared.   A cold steam vaporizer or humidifier in your room or home may help loosen mucus.   Coughing is often worse at night. Sleeping in a semi-upright position in a recliner or using a couple pillows under your head will help with this.   Get rest as you feel it is needed. Your body will usually let you know when you need to rest.  SEEK IMMEDIATE MEDICAL CARE IF:   Your illness becomes worse. This is especially true if you are elderly or weakened from any other disease.   You cannot control your cough with suppressants and are losing sleep.   You begin coughing up blood.   You develop pain which is getting worse or   is uncontrolled with medicines.   You have a fever.   Any of the symptoms which initially brought you in for treatment are getting worse rather than better.   You develop shortness of breath or chest pain.  MAKE SURE YOU:   Understand these instructions.   Will watch your condition.   Will get help right away if you are not doing well or get worse.  Document Released: 03/05/2005 Document Revised: 11/15/2010 Document Reviewed: 05/25/2010 [employer] Patient Information 2012  [employer]. 

## 2011-04-04 NOTE — Progress Notes (Signed)
  Subjective:    Patient ID: [name], female    DOB: May 04, 1945, 66 y.o.   MRN: [id number]  HPI Pt is here with her husband c/o increase falls over the last several months but worse recently.  Pt just finished tx for pneumonia but does not feel better.  She is very weak and recently fell and has bruiss on her face.  She did not go to ER.  + coughing and sleeping alot   Review of Systems    as above Objective:   Physical Exam  Constitutional: She is oriented to person, place, and time. Vital signs are normal. She appears lethargic.  Non-toxic appearance. She has a sickly appearance. She appears ill. No distress.  Cardiovascular: S1 normal and S2 normal.   Pulmonary/Chest: Effort normal. No respiratory distress. She has no wheezes. She has rales.       RLL rales  Neurological: She is oriented to person, place, and time. She appears lethargic.       + weakness in legs and arms B/l -- mild   Psychiatric: She has a normal mood and affect. Her behavior is normal.          Assessment & Plan:  Hx pneumonia---  Rocephin given in office                           Refill avelox---f/u pulmonary--if pt has increase in symptoms rto or go to ER--she and her husband understand Weakness/ falling---check labs ,  Some of this may be due to pneumonia but symptoms did start before she was dx pneumonia---neuro

## 2011-04-05 ENCOUNTER — Encounter: Payer: Self-pay | Admitting: Neurology

## 2011-04-05 ENCOUNTER — Telehealth: Payer: Self-pay | Admitting: Internal Medicine

## 2011-04-05 NOTE — Telephone Encounter (Signed)
Per Dr. Rhea Belton, may attempt perpherial IV blood drawn x 2 if no blood from portacath.. Ordered faxed to [phone] attn: Harmon Dun.

## 2011-04-05 NOTE — Telephone Encounter (Signed)
Spoke with Marcelino Duster and the home health nurse could not get blood from portacath last night. She will try again tonight. Marcelino Duster is asking for an order for perpherial IV blood draw on patient.

## 2011-04-06 ENCOUNTER — Telehealth: Payer: Self-pay | Admitting: Internal Medicine

## 2011-04-06 NOTE — Telephone Encounter (Signed)
Attempted to call Ms. Flowers but unable to reach her at [employer].

## 2011-04-09 ENCOUNTER — Encounter: Payer: Self-pay | Admitting: Adult Health

## 2011-04-09 ENCOUNTER — Ambulatory Visit (INDEPENDENT_AMBULATORY_CARE_PROVIDER_SITE_OTHER): Payer: Medicare Other | Admitting: Adult Health

## 2011-04-09 ENCOUNTER — Ambulatory Visit (INDEPENDENT_AMBULATORY_CARE_PROVIDER_SITE_OTHER)
Admission: RE | Admit: 2011-04-09 | Discharge: 2011-04-09 | Disposition: A | Discharge status: Home / Self Care | Payer: Medicare Other | Source: Ambulatory Visit | Attending: Adult Health | Admitting: Adult Health

## 2011-04-09 VITALS — BP 112/70 | HR 75 | Temp 97.1°F | Ht 62.0 in | Wt 101.4 lb

## 2011-04-09 DIAGNOSIS — J189 Pneumonia, unspecified organism: Secondary | ICD-10-CM

## 2011-04-09 NOTE — Telephone Encounter (Signed)
I agree with the change in cTNA as suggested by [facility].

## 2011-04-09 NOTE — Progress Notes (Signed)
  Subjective:    Patient ID: [name], female    DOB: [birth year], 66 y.o.   MRN: [id number]  HPI  PCP - [facility] - [facility] - Juanda Chance  66 yo female with history of COPD/emphysema, chronic hypoxemic respiratory failure, and recurrent pneumonia -aspiration suspected. She states swallow evaln was nml - I could not find this record. Last abx -levaquin 9/12. She has a non ischemic cardiomyopathy &  Is on TPN for chronic malnutrition & ? Intestina fistula via rt portacath. Wt of 105 lbs is the best We were asked by Dr hochrein to work her in due to finding of RLL infiltrate on CXR - on avelox since   12/27 C/o cough - does not look at color, no fever >rx avelox x 14 d  04/09/2011 Follow up  Pt returns for follow up for Pneumonia. Does feel her  breathing has improved, but still have  prod cough.  She has few days of Avelox left. She was seen 1 month ago tx w/ Avelox x 2 weeks with some help but cough returned and she was restarted on Avelox. CXR shows persistent densities in RLL.  She is feeling much better . No hemoptysis or fever.  No increased edema.  Of note she is on Amiodarone .     Review of Systems Constitutional:   No  weight loss, night sweats,  Fevers, chills, + fatigue, or  lassitude.  HEENT:   No headaches,  Difficulty swallowing,  Tooth/dental problems, or  Sore throat,                No sneezing, itching, ear ache, nasal congestion, post nasal drip,   CV:  No chest pain,  Orthopnea, PND, swelling in lower extremities, anasarca, dizziness, palpitations, syncope.   GI  No heartburn, indigestion, abdominal pain, nausea, vomiting, diarrhea, change in bowel habits, loss of appetite, bloody stools.   Resp:   No coughing up of blood.     No chest wall deformity  Skin: no rash or lesions.  GU: no dysuria, change in color of urine, no urgency or frequency.  No flank pain, no hematuria   MS:  No joint pain or swelling.  No decreased range of motion.  No back  pain.  Psych:  No change in mood or affect. No depression or anxiety.  No memory loss.      .     Objective:   Physical Exam   Gen. Pleasant, thin, in no distress ENT - no lesions, no post nasal drip Neck: No JVD, no thyromegaly, no carotid bruits Lungs: no use of accessory muscles, no dullness to percussion, coarse BS w/ bibasilar  rales.  Cardiovascular: Rhythm regular, heart sounds  normal, no murmurs or gallops, no peripheral edema Musculoskeletal: No deformities, no cyanosis or clubbing        Assessment & Plan:

## 2011-04-09 NOTE — Patient Instructions (Addendum)
follow up with Dr. Craige Cotta in 3 weeks with chest xray.  Finish Avelox as planned.  Mucinex DM Twice daily  As needed  Cough/congestion  Fluids and rest

## 2011-04-09 NOTE — Telephone Encounter (Signed)
[name] with [facility] calling to let us know that there is a Sport and exercise psychologist of lipids because of this [name] will have some changes in her formula. She will get lipids 3 times/week instead of daily. Her daily formula will be changed to accommodate this. Daily formula change: Protein will increase from 70 grams to 88 grams and Dextrose will increase from 200 grams to 225 grams. Volume will remain the same.

## 2011-04-10 NOTE — Assessment & Plan Note (Addendum)
Recurrent exacerbation - slowly improving but w/ residual densities on xray  Will get her to finish abx. If on return cxr  If not improving -would consider repeating CT Chest  Last CT chest 07/2010 (There are patchy areas of alveolar infiltrate bilaterally, as described above. The right lower lobe has improved since the prior study but the right middle lobe and medial right upper lobes have worsened. The right middle lobe is more consolidated.)  Also consider ESR (? Amio related ) vs recurrent Aspiration  Pt was seen by GI Dr. Juanda Chance -in regards to aspiration -she has an extensive GI hx with hx of adhesion , small bowel obstruction with previous fistula requiring  G Tube removal , known distal ileal stricture on steroid challenge on chronic TPN .    Plan  Finish Avelox as planned.  Mucinex DM Twice daily  As needed  Cough/congestion  Fluids and rest  Follow up with   Dr. Craige Cotta in 3 weeks with Dr. Craige Cotta

## 2011-04-11 NOTE — Progress Notes (Signed)
Reviewed, and agree with assessment/plan. 

## 2011-04-12 ENCOUNTER — Telehealth: Payer: Self-pay | Admitting: *Deleted

## 2011-04-12 DIAGNOSIS — T82594A Other mechanical complication of infusion catheter, initial encounter: Secondary | ICD-10-CM

## 2011-04-12 NOTE — Telephone Encounter (Signed)
Please call IR at [facility] to ask if they deal with clogged up Porta-cath? Another possibility is [facility] where people get infusions, they may be able to call IV team to flush the Porta-cath. The last resort would be for her to go to the surgeon who put it in.

## 2011-04-12 NOTE — Telephone Encounter (Signed)
Dr Juanda Chance- Amy Esterwood, PA-C states that she received a call yesterday from [facility] that patient's port a cath needs to be checked. They are able to flush the cath and use it for TPN but are unable to draw blood, so they have to get blood peripherally which is very difficult. Dr Juanda Chance, please advise.

## 2011-04-13 NOTE — Telephone Encounter (Signed)
Spoke with Dr. Juanda Chance re: IR looking at Catheter and possible TPA. Ok to do short TPA if needed. If needs infusional TPA, will have them call us first.

## 2011-04-13 NOTE — Telephone Encounter (Signed)
Patient is going on Monday to dx radiology to have portacath checked.

## 2011-04-14 IMAGING — XA IR REPLACE G-TUBE/COLONIC TUBE
1 series · 2 of 2 positions shown · non-contrast
Comparison: none

CLINICAL HISTORY: Recently removed gastrostomy tube.  The patient
has a gastrocutaneous fistula with irritation to the skin.

[Series 300: sp replc gastro/colonic tube percut w/fl · 2 of 2 slices shown]
[im 1/2]
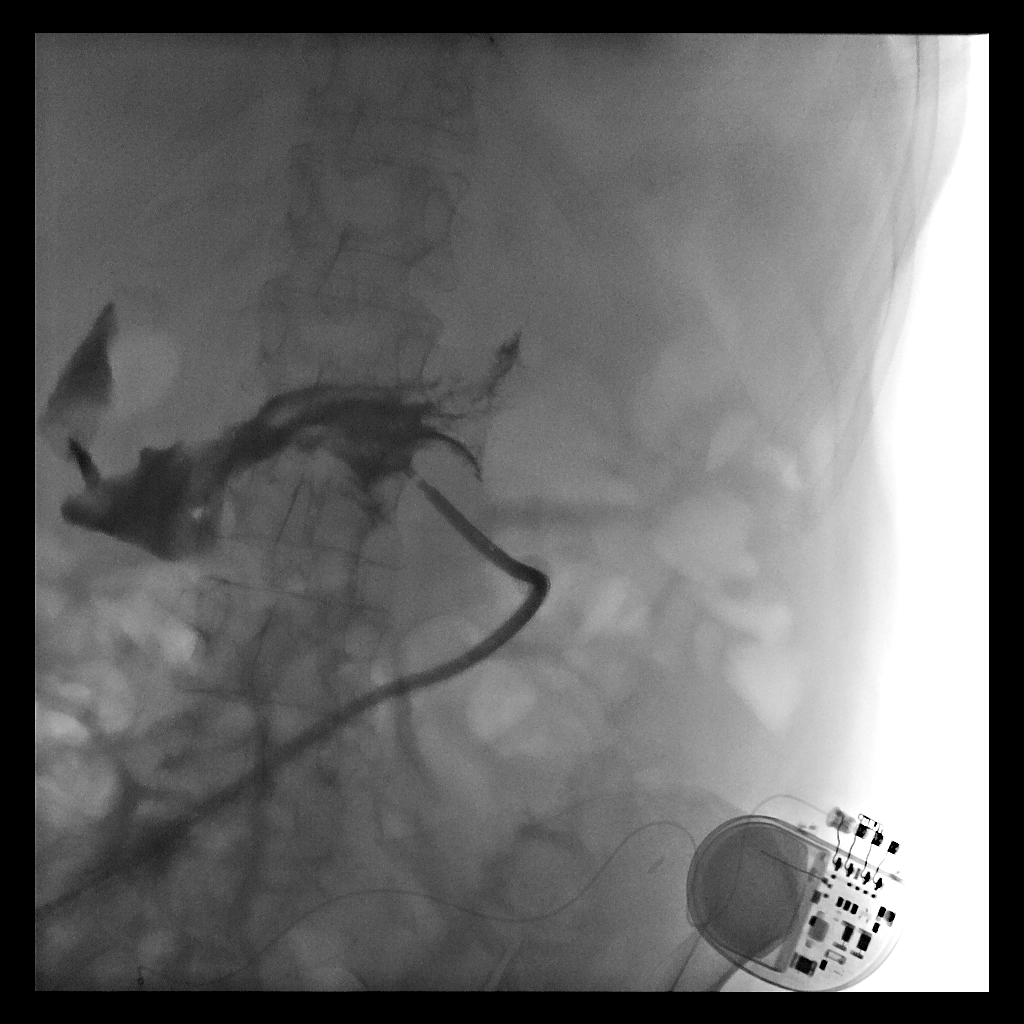
[im 2/2]
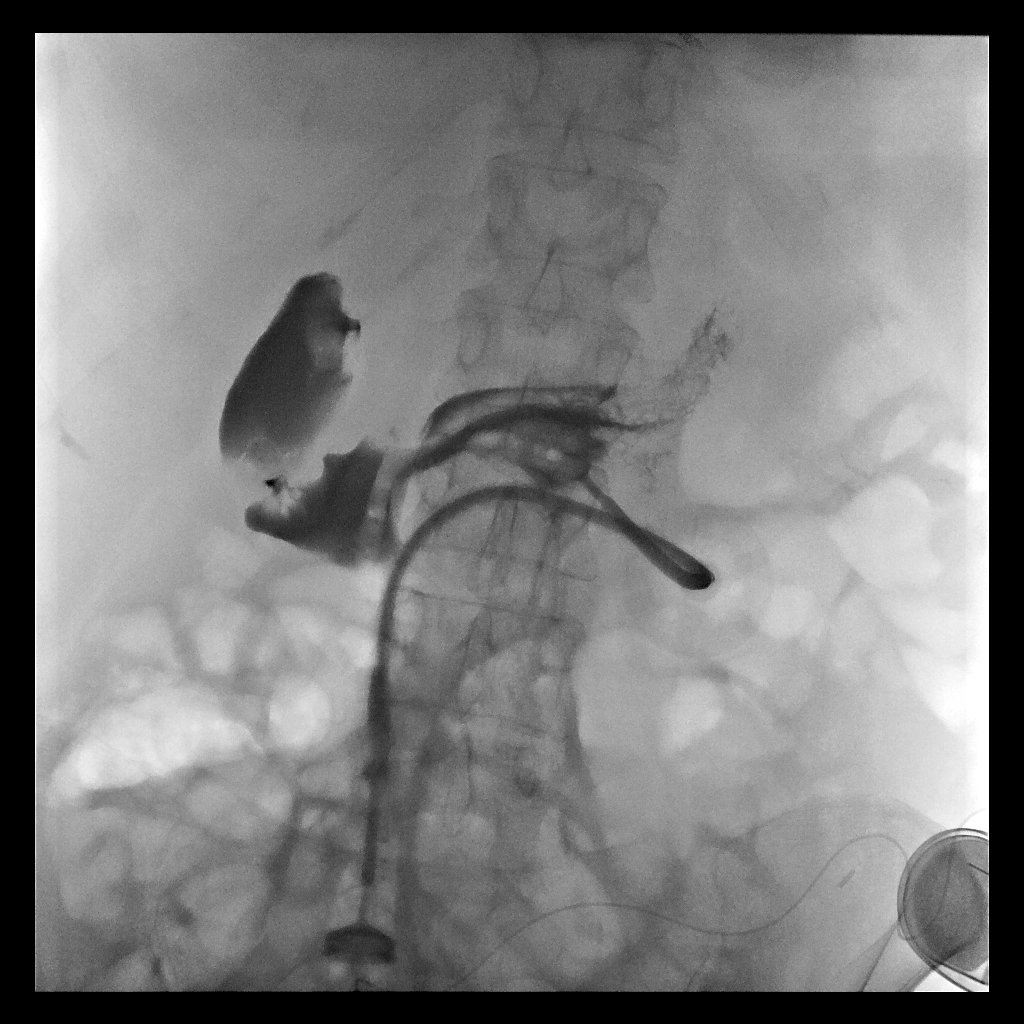

[2 of 2 positions shown; findings below may reference images not displayed]

PROCEDURE(S): REPLACEMENT OF GASTROSTOMY TUBE WITH FLUOROSCOPY

Medications:None

Moderate sedation time:None

Fluoroscopy time: 0.1 minutes

Procedure:The bandage was removed from the patient's abdomen.  The
skin around the old gastrostomy tube is very irritated and
erythematous.  The gastrostomy tube site was anesthetized with
viscous lidocaine.  An 18 French balloon retention tube was easily
advanced through the ostomy into the stomach.  The balloon was
inflated with 10 ml of saline.  Contrast was injected to confirm
placement in the stomach.
FINDINGS: Gastrostomy tube is well positioned within the stomach
body.

Complications: None
IMPRESSION: Successful replacement of the gastrostomy tube.

## 2011-04-16 ENCOUNTER — Ambulatory Visit (HOSPITAL_COMMUNITY)
Admission: RE | Admit: 2011-04-16 | Discharge: 2011-04-16 | Disposition: A | Discharge status: Home / Self Care | Payer: Medicare Other | Source: Ambulatory Visit | Attending: Internal Medicine | Admitting: Internal Medicine

## 2011-04-16 DIAGNOSIS — Y838 Other surgical procedures as the cause of abnormal reaction of the patient, or of later complication, without mention of misadventure at the time of the procedure: Secondary | ICD-10-CM | POA: Insufficient documentation

## 2011-04-16 DIAGNOSIS — T82594A Other mechanical complication of infusion catheter, initial encounter: Secondary | ICD-10-CM

## 2011-04-16 DIAGNOSIS — T82898A Other specified complication of vascular prosthetic devices, implants and grafts, initial encounter: Secondary | ICD-10-CM | POA: Insufficient documentation

## 2011-04-16 MED ORDER — IOHEXOL 300 MG/ML  SOLN
5.0000 mL | Freq: Once | INTRAMUSCULAR | Status: AC | PRN
  Administered 2011-04-16: 5 mL via INTRAVENOUS

## 2011-04-19 ENCOUNTER — Encounter: Payer: Self-pay | Admitting: Internal Medicine

## 2011-04-19 ENCOUNTER — Other Ambulatory Visit: Payer: Self-pay | Admitting: Family Medicine

## 2011-04-19 ENCOUNTER — Encounter: Payer: Medicare Other | Admitting: *Deleted

## 2011-04-19 NOTE — Telephone Encounter (Signed)
Last seen 04/04/11 and filled 01/16/11.   Please advise    KP

## 2011-04-20 MED ORDER — TEMAZEPAM 15 MG PO CAPS: ORAL_CAPSULE | ORAL | Status: DC

## 2011-04-20 NOTE — Telephone Encounter (Signed)
Addended by: Arnette Norris on: 04/20/2011 08:26 AM   Modules accepted: Orders

## 2011-04-20 NOTE — Telephone Encounter (Signed)
Rx was approved but had an error..prescription for resent     KP

## 2011-04-23 ENCOUNTER — Encounter: Payer: Self-pay | Admitting: Internal Medicine

## 2011-04-23 ENCOUNTER — Ambulatory Visit (INDEPENDENT_AMBULATORY_CARE_PROVIDER_SITE_OTHER): Payer: Medicare Other | Admitting: *Deleted

## 2011-04-23 DIAGNOSIS — I5022 Chronic systolic (congestive) heart failure: Secondary | ICD-10-CM

## 2011-04-23 LAB — PACEMAKER DEVICE OBSERVATION
AL AMPLITUDE: 1.4 mv
AL IMPEDENCE PM: 418 Ohm
AL THRESHOLD: 0.75 V
ATRIAL PACING PM: 0
BAMS-0001: 170 {beats}/min
BATTERY VOLTAGE: 3.01 V
LV LEAD IMPEDENCE PM: 589 Ohm
LV LEAD THRESHOLD: 0.5 V
RV LEAD AMPLITUDE: 5.9 mv
RV LEAD IMPEDENCE PM: 551 Ohm
RV LEAD THRESHOLD: 0.75 V
VENTRICULAR PACING PM: 100

## 2011-04-23 NOTE — Progress Notes (Signed)
Patient presents for device clinic pacemaker check.  No problems with shortness of breath, chest pain, palpitations, or syncope.  Device interrogated and found to be functioning normally.  No changes made today.  See [facility] report for full details.  Plan ROV with Dr. Graciela Husbands in 6 months.  Gypsy Balsam, RN, BSN 04/23/2011 12:17 PM

## 2011-04-25 ENCOUNTER — Telehealth: Payer: Self-pay | Admitting: *Deleted

## 2011-04-25 NOTE — Telephone Encounter (Signed)
Received a call from Eston Esters, dietitian with [facility]. She reports patient is vomiting 5 times/day. Patient has had increased N,V,D for the last 2-3 days. Patient is using Phenergan and Protonix without relief. She is asking if Dr. Juanda Chance would like to add IV Zofran 8 mg to TPN(pharmacist said this can be done) or if she wants to adjust oral medications. Patient has not been running the extra IVF but they have encouraged her too. Please, advise.

## 2011-04-25 NOTE — Telephone Encounter (Signed)
OK to add Zofran 8 mg IV in TNA. She ought to go to ER if she continues to vomit.

## 2011-04-26 NOTE — Telephone Encounter (Signed)
Spoke with [name] at [facility] and gave her the order to add Zofran 8 mg IV to TNA. Also, spoke with patient and told her if vomiting continues, she must go to the ER.

## 2011-04-30 ENCOUNTER — Encounter: Payer: Self-pay | Admitting: Adult Health

## 2011-04-30 ENCOUNTER — Ambulatory Visit (INDEPENDENT_AMBULATORY_CARE_PROVIDER_SITE_OTHER)
Admission: RE | Admit: 2011-04-30 | Discharge: 2011-04-30 | Disposition: A | Discharge status: Home / Self Care | Payer: Medicare Other | Source: Ambulatory Visit | Attending: Adult Health | Admitting: Adult Health

## 2011-04-30 ENCOUNTER — Ambulatory Visit (INDEPENDENT_AMBULATORY_CARE_PROVIDER_SITE_OTHER): Payer: Medicare Other | Admitting: Adult Health

## 2011-04-30 ENCOUNTER — Ambulatory Visit (INDEPENDENT_AMBULATORY_CARE_PROVIDER_SITE_OTHER): Payer: Medicare Other | Admitting: Internal Medicine

## 2011-04-30 VITALS — BP 100/68 | HR 76 | Temp 97.6°F | Ht 62.0 in | Wt 101.0 lb

## 2011-04-30 DIAGNOSIS — E538 Deficiency of other specified B group vitamins: Secondary | ICD-10-CM

## 2011-04-30 DIAGNOSIS — J189 Pneumonia, unspecified organism: Secondary | ICD-10-CM

## 2011-04-30 NOTE — Progress Notes (Signed)
Subjective:    Patient ID: [name], female    DOB: 02/01/1946, 66 y.o.   MRN: [id number]  HPI  PCP - [facility]on [facility] - [facility] - Juanda Chance  66 yo female with history of COPD/emphysema, chronic hypoxemic respiratory failure, and recurrent pneumonia -aspiration suspected. She states swallow evaln was nml - I could not find this record. Last abx -levaquin 9/12. She has a non ischemic cardiomyopathy &  Is on TPN for chronic malnutrition & ? Intestina fistula via rt portacath. Wt of 105 lbs is the best We were asked by Dr hochrein to work her in due to finding of RLL infiltrate on CXR - on avelox since   12/27 C/o cough - does not look at color, no fever >rx avelox x 14 d  04/09/11  Follow up  Pt returns for follow up for Pneumonia. Does feel her  breathing has improved, but still have  prod cough.  She has few days of Avelox left. She was seen 1 month ago tx w/ Avelox x 2 weeks with some help but cough returned and she was restarted on Avelox. CXR shows persistent densities in RLL.  She is feeling much better . No hemoptysis or fever.  No increased edema.  Of note she is on Amiodarone .  >>cxr w/ no change, rec to finish avelox   04/30/2011 Follow up  Returns for follow up of PNA . Reports breathing is "a little better" since last ov. Does still having prod cough, and occ wheezing/SOB. Feels tired a lot. CXR today shows no change in RLL densities.  We discussed getting a repeat CT chest to evaluate this area.  She denies a fever or discolored mucus.  She denies choking or dysphagia. She is followed by GI for chronic GI issues (on TPN via portacath)  Could not find a swallow evaluation on file.  No chest pain or edema.   Review of Systems Constitutional:   No  weight loss, night sweats,  Fevers, chills, + fatigue, or  lassitude.  HEENT:   No headaches,  Difficulty swallowing,  Tooth/dental problems, or  Sore throat,                No sneezing, itching, ear ache, nasal congestion, post  nasal drip,   CV:  No chest pain,  Orthopnea, PND, swelling in lower extremities, anasarca, dizziness, palpitations, syncope.   GI  No heartburn, indigestion, abdominal pain, nausea, vomiting, diarrhea, change in bowel habits, loss of appetite, bloody stools.   Resp:   No coughing up of blood.     No chest wall deformity  Skin: no rash or lesions.  GU: no dysuria, change in color of urine, no urgency or frequency.  No flank pain, no hematuria   MS:  No joint pain or swelling.  No decreased range of motion.  No back pain.  Psych:  No change in mood or affect. No depression or anxiety.  No memory loss.      .     Objective:   Physical Exam   Gen. Pleasant, thin, in no distress ENT - no lesions, no post nasal drip Neck: No JVD, no thyromegaly, no carotid bruits Lungs: no use of accessory muscles, no dullness to percussion, coarse BS w/ bibasilar  rales.  Cardiovascular: Rhythm regular, heart sounds  normal, no murmurs or gallops, no peripheral edema Musculoskeletal: No deformities, no cyanosis or clubbing   CXR :  Stable opacity in the right lung base, residual infiltrate  versus  scar.  Stable coarsened chronic interstitial prominence.      Assessment & Plan:

## 2011-04-30 NOTE — Patient Instructions (Addendum)
We are setting you up for a CT Chest  Continue on Spiriva daily  Have home health draw labs this week.  follow up Dr. Craige Cotta  In 2-3 weeks as planned.  Please contact office for sooner follow up if symptoms do not improve or worsen or seek emergency care

## 2011-04-30 NOTE — Assessment & Plan Note (Signed)
Recurrent PNA- clinically improved Will hold on further abx at this time.  ? Aspiration related. Xray today with no significant change in RLL infiltrates.  Will check CT chest today also , get labs with ESR as she is on Amiodarone.  Have her return in 2 weeks to review with Dr. Craige Cotta   May need to set up for swallow evaluation as well -will wait till CT results are back.

## 2011-05-01 ENCOUNTER — Other Ambulatory Visit: Payer: Self-pay | Admitting: *Deleted

## 2011-05-01 DIAGNOSIS — R52 Pain, unspecified: Secondary | ICD-10-CM

## 2011-05-01 MED ORDER — OXYCODONE-ACETAMINOPHEN 10-325 MG PO TABS: 1.0000 | ORAL_TABLET | ORAL | Status: DC | PRN

## 2011-05-01 MED ORDER — FENTANYL 50 MCG/HR TD PT72: 1.0000 | MEDICATED_PATCH | TRANSDERMAL | Status: DC

## 2011-05-01 NOTE — Telephone Encounter (Signed)
Ok to refill 

## 2011-05-01 NOTE — Progress Notes (Signed)
Reviewed and agree with assessment/plan. 

## 2011-05-01 NOTE — Telephone Encounter (Signed)
Pt aware Rx ready for pick up 

## 2011-05-01 NOTE — Telephone Encounter (Signed)
Last OV 04-04-11, last filled 03-25-10 #120 oxycodone, #10 fentanyl

## 2011-05-03 ENCOUNTER — Other Ambulatory Visit: Payer: Self-pay | Admitting: Adult Health

## 2011-05-03 DIAGNOSIS — J189 Pneumonia, unspecified organism: Secondary | ICD-10-CM

## 2011-05-04 ENCOUNTER — Other Ambulatory Visit: Payer: Self-pay | Admitting: Adult Health

## 2011-05-04 ENCOUNTER — Other Ambulatory Visit: Payer: Medicare Other

## 2011-05-04 ENCOUNTER — Ambulatory Visit (INDEPENDENT_AMBULATORY_CARE_PROVIDER_SITE_OTHER)
Admission: RE | Admit: 2011-05-04 | Discharge: 2011-05-04 | Disposition: A | Discharge status: Home / Self Care | Payer: Medicare Other | Source: Ambulatory Visit | Attending: Cardiology | Admitting: Cardiology

## 2011-05-04 DIAGNOSIS — J189 Pneumonia, unspecified organism: Secondary | ICD-10-CM

## 2011-05-08 ENCOUNTER — Other Ambulatory Visit: Payer: Self-pay | Admitting: *Deleted

## 2011-05-08 ENCOUNTER — Encounter: Payer: Self-pay | Admitting: Adult Health

## 2011-05-08 MED ORDER — CARVEDILOL 6.25 MG PO TABS: 6.2500 mg | ORAL_TABLET | Freq: Two times a day (BID) | ORAL | Status: DC

## 2011-05-10 ENCOUNTER — Other Ambulatory Visit: Payer: Self-pay | Admitting: Physician Assistant

## 2011-05-11 ENCOUNTER — Encounter: Payer: Self-pay | Admitting: Neurology

## 2011-05-11 ENCOUNTER — Ambulatory Visit (INDEPENDENT_AMBULATORY_CARE_PROVIDER_SITE_OTHER): Payer: Medicare Other | Admitting: Neurology

## 2011-05-11 DIAGNOSIS — R5381 Other malaise: Secondary | ICD-10-CM

## 2011-05-11 DIAGNOSIS — R531 Weakness: Secondary | ICD-10-CM

## 2011-05-11 DIAGNOSIS — R5383 Other fatigue: Secondary | ICD-10-CM

## 2011-05-11 DIAGNOSIS — W19XXXA Unspecified fall, initial encounter: Secondary | ICD-10-CM

## 2011-05-11 NOTE — Patient Instructions (Signed)
We will call you for your EEG appointment at [facility].

## 2011-05-11 NOTE — Progress Notes (Signed)
[name],  Thank you for having me see [name] in consultation today at [facility] for her problem with falls, spells of confusion and twitching.  As you may recall, she is a 66 y.o. year old female with a history of chronic pain, TPN dependence, CHF who presents with 1-2 year history of spells of confusion lasting hours up to a day that are sometimes accompanied by falls.  The falls also occur without the spells of confusion.  The confusion usually involves day night reversal, she will be cooking dinner at midnight for example.  She also appears to slur her words and have problems finding words during these times.  Her falls are not described as a loss of consciousness but rather "tripping" or her "legs giving away." She does not generally remember the loss of consciousness spells.  She did have a confusion spell about 3 years ago where she was found to be hypoglycemic.  No one has checked her blood pressure or glucose during these more recent spells.  She denies new medication.  She denies lightheadedness.    She has been on TPN for about 6 months for chronic SBO.  The spells occurred before the TPN use.    She denies liver or kidney problems.  She is on chronic opiods for multiple sites of pain.  These have not changed recently.  She also complains of twitching at night and during the day.  This is multifocal and involves the head as well.  She uses O2 for her COPD.  Patient also complains of difficulty with memory.    Past Medical History  Diagnosis Date  . Small bowel obstruction   . HTN (hypertension)   . Systolic CHF, chronic   . GERD (gastroesophageal reflux disease)   . CAD (coronary artery disease)     mild disease per cath in 2008  . LBBB (left bundle branch block)   . Atrial fibrillation     on amiodarone  . Venous embolism and thrombosis of subclavian vein     after pacemaker insertion Oct 2011  . Diverticulosis   . COPD (chronic obstructive pulmonary  disease)     -06/28/08 PFT FEV1 1.95 (97%), FVC 2.79 (101%), FEV1% 70, TLC 3.88 (87%), DLCO 64%  . Pleural effusion      s/p right thoracentesis 03/09  . Small bowel obstruction   . DDD (degenerative disc disease)   . Urinary incontinence     has pacemaker stimulator in her bladder  . RLS (restless legs syndrome)   . Primary dilated cardiomyopathy     EF 45 to 50% per echo in Jan 2012  . Vitamin B12 deficiency   . Gastroparesis   . Chronic nausea   . Chronic abdominal pain   . Chronic pain syndrome   . Gastrocutaneous fistula   . Pacemaker     CRT therapy; followed by Dr. Graciela Husbands  . H/O: GI bleed     from Pradaxa  . Oxygen dependent     2 liters via [place] all the time  . CHF (congestive heart failure)   . Pneumonia - 03/09, 12/09, 02/10, 06/10  . Anemia   . Fibromyalgia   . Fibromyalgia   . Hx of stress fx aug 2011    right hip   . Elbow fracture, left aug 2012    Past Surgical History  Procedure Date  . Appendectomy   . Bladder surgery   . Total abdominal hysterectomy   . Pacemaker  placement     CRT-PPM-Medtronic in October 2011  . Peg placement 2012  . Abdominal adhesion surgery     numerous surgeries, last done 2011  . Peg removed     with complications  . Tonsillectomy age 36  . Colectomy     Intestinal resection (5times),S/P PARTIAL COLECTOMY 2001  . Cardiac catheterization 2008  . Interstim implant revision 03/06/2011    Procedure: REVISION OF Leane Platt;  Surgeon: Martina Sinner, MD;  Location: [facility];  Service: Urology;  Laterality: N/A;  Replacement of Neurostimulator    History   Social History  . Marital Status: Married    Spouse Name: N/A    Number of Children: 5  . Years of Education: N/A   Occupational History  . disabled    Social History Main Topics  . Smoking status: Former Smoker -- 1.0 packs/day for 20 years    Types: Cigarettes    Quit date: 03/19/1986  . Smokeless tobacco: Never Used  . Alcohol Use: No  . Drug Use: No  .  Sexually Active: None   Other Topics Concern  . None   Social History Narrative   Occupation: DisabledAlcohol Use - noIllicit Drug Use - noPatient is a former smoker, quit 1988 x32yrs, <1ppd. Daily Caffeine Use    Family History  Problem Relation Age of Onset  . Heart disease Father   . Stroke Mother   . Heart disease Mother   . Colon cancer Neg Hx   . Heart disease Brother   . Breast cancer Maternal Aunt   . Heart disease Paternal Aunt   . Diabetes Paternal Grandfather     Current Outpatient Prescriptions on File Prior to Visit  Medication Sig Dispense Refill  . amiodarone (PACERONE) 200 MG tablet Take 200 mg by mouth every morning.       . carvedilol (COREG) 6.25 MG tablet Take 1 tablet (6.25 mg total) by mouth 2 (two) times daily with a meal.  60 tablet  4  . cyclobenzaprine (FLEXERIL) 10 MG tablet TAKE 1 TABLET 3 TIMES DAILY AS NEEDED  60 tablet  1  . denosumab (PROLIA) 60 MG/ML SOLN Inject 60 mg into the skin every 6 (six) months. Twice a year      . etodolac (LODINE) 400 MG tablet Take 400 mg by mouth 2 (two) times daily.       . fentaNYL (DURAGESIC - DOSED MCG/HR) 50 MCG/HR Place 1 patch (50 mcg total) onto the skin every 3 (three) days.  10 patch  0  . furosemide (LASIX) 20 MG tablet Take 20 mg by mouth every morning.       . gabapentin (NEURONTIN) 600 MG tablet TAKE 1 TABLET 4 TIMES DAILY  120 tablet  5  . iron dextran complex (INFED) 50 MG/ML injection Please give infed infusion (no test dose needed) over 4 hours per pharmacy calculated dose. Ht: 5'2 Wt: 99 pounds Hgb: 12  1 mL  0  . nitroGLYCERIN (NITROSTAT) 0.4 MG SL tablet Place 1 tablet (0.4 mg total) under the tongue every 5 (five) minutes as needed for chest pain. CHEST PAIN   25 tablet  11  . oxyCODONE-acetaminophen (PERCOCET) 10-325 MG per tablet Take 1 tablet by mouth every 4 (four) hours as needed. PAIN  120 tablet  0  . pantoprazole (PROTONIX) 40 MG tablet Take 40 mg by mouth every morning.       .  predniSONE (DELTASONE) 10 MG tablet Take 10 mg by mouth daily.      [name]  promethazine (PHENERGAN) 25 MG tablet TAKE 1 TABLET BY MOUTH EVERY 6 HOURS AS NEEDED FOR NAUSEA  40 tablet  0  . temazepam (RESTORIL) 15 MG capsule 1-2 tablets by mouth at bedtime as needed  60 capsule  0  . tiotropium (SPIRIVA HANDIHALER) 18 MCG inhalation capsule Place 18 mcg into inhaler and inhale every morning.       . TPN ADULT Inject into the vein every 12 (twelve) hours. tpn 700 pm to 0700 am daily via right chest port a cath       Current Facility-Administered Medications on File Prior to Visit  Medication Dose Route Frequency Provider Last Rate Last Dose  . cyanocobalamin ((VITAMIN B-12)) injection 1,000 mcg  1,000 mcg Intramuscular Q30 days Hart Carwin, MD   1,000 mcg at 04/30/11 1219    Allergies  Allergen Reactions  . Pradaxa Other (See Comments)    LOW BLOOD COUNT  . Sulfonamide Derivatives Other (See Comments)    SHAKE   . Talwin Other (See Comments)    hallucination      ROS:  13 systems were reviewed and are notable for chronic abdominal pain, chronic arthritic pain, fibromyalgia.  Patient has chronic SOB.  .  All other review of systems are unremarkable.   Examination:  Filed Vitals:   05/11/11 1527  BP: 128/72  Pulse: 84  Height: 5\' 2"  (1.575 m)  Weight: 101 lb (45.813 kg)     In general, thin appearing women with O2 in place  Cardiovascular: The patient has no carotid bruits.  Fundoscopy:  Disks are flat. Vessel caliber within normal limits.  Mental status:   MMSE 26/27 with 1 point lost for 3 word recall  Cranial Nerves: Pupils are equally round and reactive to light. Visual fields full to confrontation. Extraocular movements are intact without nystagmus. Facial sensation and muscles of mastication are intact. Muscles of facial expression are symmetric. Hearing intact to bilateral finger rub. Tongue protrusion, uvula, palate midline.  Shoulder shrug intact  Motor:  The  patient has normal bulk and tone, no pronator drift.  Patient has asterixis.  5/5 muscle strength bilaterally.  Reflexes:  1+ throughout.    Toes down  Coordination:  Normal finger to nose.  No dysdiadokinesia.  Sensation is intact to temperature and vibration symmetrically.  Gait and Station are narrow based, slightly unstable.  Romberg -  Impression/Recs: 1.  Spells of confusion - I am very suspicious for a toxic metabolic cause.  I think it is unlikely these are seizures or represent a progressive neurologic disease.  She notably has asterixis on exam.  I have asked her to have an ammonia checked as hyperammonemia can cause transient confusion.  She also will check a blood glucose during her spells of confusion and a blood pressure.  It is also possible this is related to medication intoxication as well given her opiod use.  I will also get an EEG to see if there are signs of encephalopathy. 2. Falling - I think her falls are likely related to #1. 3. Myoclonus during sleep - likely exacerbated by toxic/metabolic causes.  Certainly opiod use can cause this.    We will see the patient back in 6 weeks.  Thank you for having [place] see [name] in consultation.  Feel free to contact me with any questions.  Lupita Raider Modesto Charon, MD [facility], [place] [address] [place], [place] [address] Phone: [phone] Fax: [phone].

## 2011-05-14 ENCOUNTER — Other Ambulatory Visit: Payer: Self-pay | Admitting: Family Medicine

## 2011-05-14 ENCOUNTER — Telehealth: Payer: Self-pay | Admitting: Internal Medicine

## 2011-05-14 ENCOUNTER — Ambulatory Visit (INDEPENDENT_AMBULATORY_CARE_PROVIDER_SITE_OTHER): Payer: Medicare Other | Admitting: *Deleted

## 2011-05-14 DIAGNOSIS — M810 Age-related osteoporosis without current pathological fracture: Secondary | ICD-10-CM

## 2011-05-14 MED ORDER — DENOSUMAB 60 MG/ML ~~LOC~~ SOLN
60.0000 mg | Freq: Once | SUBCUTANEOUS | Status: AC
  Administered 2011-05-14: 60 mg via SUBCUTANEOUS

## 2011-05-14 MED ORDER — PROMETHAZINE HCL 25 MG PO TABS: 25.0000 mg | ORAL_TABLET | Freq: Three times a day (TID) | ORAL | Status: DC | PRN

## 2011-05-14 MED ORDER — CYCLOBENZAPRINE HCL 10 MG PO TABS: 10.0000 mg | ORAL_TABLET | Freq: Three times a day (TID) | ORAL | Status: DC | PRN

## 2011-05-14 NOTE — Telephone Encounter (Signed)
rx sent. Patient advised. 

## 2011-05-14 NOTE — Telephone Encounter (Signed)
Spoke with patient and she is calling because her Phenergan refill was declined. She wasn't sure why it was declined and wanted to let us know her insurance declined the Zofran IV in her TPA.

## 2011-05-14 NOTE — Telephone Encounter (Signed)
Last seen 04/04/11 and filled 03/28/11 # 60 with 1 refill.    Please advise     KP

## 2011-05-15 ENCOUNTER — Telehealth: Payer: Self-pay | Admitting: *Deleted

## 2011-05-15 ENCOUNTER — Encounter: Payer: Self-pay | Admitting: Internal Medicine

## 2011-05-15 ENCOUNTER — Other Ambulatory Visit: Payer: Self-pay | Admitting: Family Medicine

## 2011-05-15 ENCOUNTER — Ambulatory Visit: Payer: Medicare Other | Admitting: Cardiology

## 2011-05-15 ENCOUNTER — Other Ambulatory Visit: Payer: Self-pay | Admitting: *Deleted

## 2011-05-15 LAB — POCT URINALYSIS DIPSTICK
Blood, UA: NEGATIVE
Glucose, UA: NEGATIVE
Ketones, UA: NEGATIVE
Nitrite, UA: NEGATIVE
Protein, UA: NEGATIVE
Spec Grav, UA: 1.02
Urobilinogen, UA: 0.2
pH, UA: 6

## 2011-05-15 MED ORDER — FUROSEMIDE 20 MG PO TABS: 20.0000 mg | ORAL_TABLET | ORAL | Status: DC

## 2011-05-15 MED ORDER — MAGNESIUM OXIDE 400 MG PO TABS: ORAL_TABLET | ORAL | Status: DC

## 2011-05-15 NOTE — Progress Notes (Signed)
Addended by: Silvio Pate D on: 05/15/2011 02:28 PM   Modules accepted: Orders

## 2011-05-15 NOTE — Telephone Encounter (Signed)
[name] from [facility] called re: Magnesium level from 05/14/11- less than 0.5. Dr. Juanda Chance notified. Patient to take Magnesium Oxide 400 mg BID x l week. Repeat labs in one week. Increase Magnesium in TPN as per pharmacy. [name] notified of orders. Spoke with patient and she will start the Magnesium tablets today.

## 2011-05-16 ENCOUNTER — Telehealth: Payer: Self-pay

## 2011-05-16 NOTE — Telephone Encounter (Signed)
The patient and Synetta Fail with [facility] [employer] called regarding patient's Prior Auth for her Temazepam. It was approved effective 05/15/11 through 05/14/12. Any questions, you may contact [facility] Medicare [id number].  They will fax over a copy as well. Patient requesting script be sent to the pharmacy if not already.

## 2011-05-18 ENCOUNTER — Encounter: Payer: Self-pay | Admitting: *Deleted

## 2011-05-18 LAB — URINE CULTURE: Colony Count: >=100000

## 2011-05-18 MED ORDER — CIPROFLOXACIN HCL 500 MG PO TABS: 500.0000 mg | ORAL_TABLET | Freq: Two times a day (BID) | ORAL | Status: AC

## 2011-05-22 ENCOUNTER — Ambulatory Visit (INDEPENDENT_AMBULATORY_CARE_PROVIDER_SITE_OTHER): Payer: Medicare Other | Admitting: Pulmonary Disease

## 2011-05-22 ENCOUNTER — Encounter: Payer: Self-pay | Admitting: Pulmonary Disease

## 2011-05-22 ENCOUNTER — Ambulatory Visit (HOSPITAL_COMMUNITY)
Admission: RE | Admit: 2011-05-22 | Discharge: 2011-05-22 | Disposition: A | Discharge status: Home / Self Care | Payer: Medicare Other | Source: Ambulatory Visit | Attending: Neurology | Admitting: Neurology

## 2011-05-22 ENCOUNTER — Ambulatory Visit (INDEPENDENT_AMBULATORY_CARE_PROVIDER_SITE_OTHER)
Admission: RE | Admit: 2011-05-22 | Discharge: 2011-05-22 | Disposition: A | Discharge status: Home / Self Care | Payer: Medicare Other | Source: Ambulatory Visit | Attending: Pulmonary Disease | Admitting: Pulmonary Disease

## 2011-05-22 VITALS — BP 98/60 | HR 77 | Temp 98.1°F | Ht 62.0 in | Wt 103.0 lb

## 2011-05-22 DIAGNOSIS — J9611 Chronic respiratory failure with hypoxia: Secondary | ICD-10-CM

## 2011-05-22 DIAGNOSIS — J9610 Chronic respiratory failure, unspecified whether with hypoxia or hypercapnia: Secondary | ICD-10-CM

## 2011-05-22 DIAGNOSIS — Z79899 Other long term (current) drug therapy: Secondary | ICD-10-CM | POA: Insufficient documentation

## 2011-05-22 DIAGNOSIS — J189 Pneumonia, unspecified organism: Secondary | ICD-10-CM

## 2011-05-22 DIAGNOSIS — F29 Unspecified psychosis not due to a substance or known physiological condition: Secondary | ICD-10-CM | POA: Insufficient documentation

## 2011-05-22 DIAGNOSIS — R531 Weakness: Secondary | ICD-10-CM

## 2011-05-22 DIAGNOSIS — J4489 Other specified chronic obstructive pulmonary disease: Secondary | ICD-10-CM

## 2011-05-22 DIAGNOSIS — J449 Chronic obstructive pulmonary disease, unspecified: Secondary | ICD-10-CM

## 2011-05-22 DIAGNOSIS — W19XXXA Unspecified fall, initial encounter: Secondary | ICD-10-CM

## 2011-05-22 DIAGNOSIS — R569 Unspecified convulsions: Secondary | ICD-10-CM

## 2011-05-22 MED ORDER — ALBUTEROL SULFATE HFA 108 (90 BASE) MCG/ACT IN AERS: 2.0000 | INHALATION_SPRAY | Freq: Four times a day (QID) | RESPIRATORY_TRACT | Status: DC | PRN

## 2011-05-22 NOTE — Assessment & Plan Note (Signed)
Clinically she is improving slowly.  Will repeat chest xray today.

## 2011-05-22 NOTE — Assessment & Plan Note (Addendum)
Stable.  Continue spiriva.  Will add prn proair.

## 2011-05-22 NOTE — Progress Notes (Signed)
Chief Complaint  Patient presents with  . Follow-up    Patient states a little better since last visit. c/o sob with exertion and cough. Denies chest pain, chest tightness, and wheezing.    History of Present Illness: [name] is a 66 y.o. female with history of COPD/emphysema, chronic hypoxemic respiratory failure, and recurrent pneumonia with focal areas of bronchiectasis.  She has been feeling better since her last episode of pneumonia.  She still has a cough, but does not look at the sputum.  She denies chest pain, fever, or wheeze.  She feels that spiriva helps.  She does not have all of her energy back.  She is currently being treated with cipro for a UTI.   Past Medical History  Diagnosis Date  . Small bowel obstruction   . HTN (hypertension)   . Systolic CHF, chronic   . GERD (gastroesophageal reflux disease)   . CAD (coronary artery disease)     mild disease per cath in 2008  . LBBB (left bundle branch block)   . Atrial fibrillation     on amiodarone  . Venous embolism and thrombosis of subclavian vein     after pacemaker insertion Oct 2011  . Diverticulosis   . COPD (chronic obstructive pulmonary disease)     -06/28/08 PFT FEV1 1.95 (97%), FVC 2.79 (101%), FEV1% 70, TLC 3.88 (87%), DLCO 64%  . Pleural effusion      s/p right thoracentesis 03/09  . Small bowel obstruction   . DDD (degenerative disc disease)   . Urinary incontinence     has pacemaker stimulator in her bladder  . RLS (restless legs syndrome)   . Primary dilated cardiomyopathy     EF 45 to 50% per echo in Jan 2012  . Vitamin B12 deficiency   . Gastroparesis   . Chronic nausea   . Chronic abdominal pain   . Chronic pain syndrome   . Gastrocutaneous fistula   . Pacemaker     CRT therapy; followed by Dr. Graciela Husbands  . H/O: GI bleed     from Pradaxa  . Oxygen dependent     2 liters via [place] all the time  . CHF (congestive heart failure)   . Pneumonia - 03/09, 12/09, 02/10, 06/10  . Anemia   .  Fibromyalgia   . Hx of stress fx aug 2011    right hip   . Elbow fracture, left aug 2012    Past Surgical History  Procedure Date  . Appendectomy   . Bladder surgery   . Total abdominal hysterectomy   . Pacemaker placement     CRT-PPM-Medtronic in October 2011  . Peg placement 2012  . Abdominal adhesion surgery     numerous surgeries, last done 2011  . Peg removed     with complications  . Tonsillectomy age 89  . Colectomy     Intestinal resection (5times),S/P PARTIAL COLECTOMY 2001  . Cardiac catheterization 2008  . Interstim implant revision 03/06/2011    Procedure: REVISION OF Leane Platt;  Surgeon: Martina Sinner, MD;  Location: [facility];  Service: Urology;  Laterality: N/A;  Replacement of Neurostimulator    Allergies  Allergen Reactions  . Pradaxa Other (See Comments)    LOW BLOOD COUNT  . Sulfonamide Derivatives Other (See Comments)    SHAKE   . Talwin Other (See Comments)    hallucination    Physical Exam:  Blood pressure 98/60, pulse 77, temperature 98.1 F (36.7 C), temperature source Oral,  height 5\' 2"  (1.575 m), weight 103 lb (46.72 kg), SpO2 96.00%. Body mass index is 18.84 kg/(m^2). Wt Readings from Last 2 Encounters:  05/22/11 103 lb (46.72 kg)  05/11/11 101 lb (45.813 kg)    General - Thin, wearing supplemental oxygen HEENT - no sinus tenderness, no oral exudate, no LAN Cardiac - s1s2 regular Chest - prolonged exhalation, normal respiratory excursion, no wheeze, right mid-lung field expiratory squeak Abdomen - soft, non-tender Extremities - no edema Skin - no rashes Neurologic - normal strength Psychiatric - normal mood, behavior   Assessment/Plan:  Outpatient Encounter Prescriptions as of 05/22/2011  Medication Sig Dispense Refill  . amiodarone (PACERONE) 200 MG tablet Take 200 mg by mouth every morning.       . carvedilol (COREG) 6.25 MG tablet Take 1 tablet (6.25 mg total) by mouth 2 (two) times daily with a meal.  60 tablet  4  .  ciprofloxacin (CIPRO) 500 MG tablet Take 1 tablet (500 mg total) by mouth 2 (two) times daily.  10 tablet  0  . cyclobenzaprine (FLEXERIL) 10 MG tablet TAKE 1 TABLET 3 TIMES DAILY AS NEEDED  60 tablet  1  . denosumab (PROLIA) 60 MG/ML SOLN Inject 60 mg into the skin every 6 (six) months. Twice a year      . etodolac (LODINE) 400 MG tablet Take 400 mg by mouth 2 (two) times daily.       . fentaNYL (DURAGESIC - DOSED MCG/HR) 50 MCG/HR Place 1 patch (50 mcg total) onto the skin every 3 (three) days.  10 patch  0  . furosemide (LASIX) 20 MG tablet Take 1 tablet (20 mg total) by mouth every morning.  30 tablet  6  . gabapentin (NEURONTIN) 600 MG tablet TAKE 1 TABLET 4 TIMES DAILY  120 tablet  5  . iron dextran complex (INFED) 50 MG/ML injection Please give infed infusion (no test dose needed) over 4 hours per pharmacy calculated dose. Ht: 5'2 Wt: 99 pounds Hgb: 12  1 mL  0  . magnesium oxide (MAG-OX 400) 400 MG tablet Take one po BID x 1 week  14 tablet  0  . nitroGLYCERIN (NITROSTAT) 0.4 MG SL tablet Place 1 tablet (0.4 mg total) under the tongue every 5 (five) minutes as needed for chest pain. CHEST PAIN   25 tablet  11  . oxyCODONE-acetaminophen (PERCOCET) 10-325 MG per tablet Take 1 tablet by mouth every 4 (four) hours as needed. PAIN  120 tablet  0  . pantoprazole (PROTONIX) 40 MG tablet Take 40 mg by mouth every morning.       . predniSONE (DELTASONE) 10 MG tablet Take 10 mg by mouth daily.      . promethazine (PHENERGAN) 25 MG tablet Take 1 tablet (25 mg total) by mouth every 8 (eight) hours as needed for nausea.  40 tablet  0  . temazepam (RESTORIL) 15 MG capsule 1-2 tablets by mouth at bedtime as needed  60 capsule  0  . tiotropium (SPIRIVA HANDIHALER) 18 MCG inhalation capsule Place 18 mcg into inhaler and inhale every morning.       . TPN ADULT Inject into the vein every 12 (twelve) hours. tpn 700 pm to 0700 am daily via right chest port a cath       Facility-Administered Encounter  Medications as of 05/22/2011  Medication Dose Route Frequency Provider Last Rate Last Dose  . cyanocobalamin ((VITAMIN B-12)) injection 1,000 mcg  1,000 mcg Intramuscular Q30 days Hart Carwin,  MD   1,000 mcg at 04/30/11 1219    Allaya Abbasi Pager:  [phone] 05/22/2011, 3:27 PM

## 2011-05-22 NOTE — Patient Instructions (Addendum)
Chest xray today Proair two puffs as needed for cough, wheeze, or chest congestion Follow up in 3 months

## 2011-05-23 ENCOUNTER — Encounter: Payer: Self-pay | Admitting: Pulmonary Disease

## 2011-05-23 ENCOUNTER — Telehealth: Payer: Self-pay | Admitting: Pulmonary Disease

## 2011-05-23 ENCOUNTER — Encounter: Payer: Self-pay | Admitting: Internal Medicine

## 2011-05-23 NOTE — Assessment & Plan Note (Signed)
Continue supplemental oxygen. 

## 2011-05-23 NOTE — Telephone Encounter (Signed)
Dg Chest 2 View  05/22/2011  *RADIOLOGY REPORT*  Clinical Data: Pneumonia, cough  CHEST - 2 VIEW  Comparison:  Chest x-ray of 04/30/2011 and CT chest of 05/04/2011.  Findings: The lungs are hyperaerated.  There is some peribronchial thickening which may indicate bronchitis.  No definite pneumonia or effusion is seen with probable scarring at the right lung base. The heart is within normal limits in size.  Permanent pacemaker and Port-A-Cath remain.  The bones are osteopenic.  IMPRESSION:  1.  Probable COPD and chronic bronchitis. 2.  Pacemaker and Port-A-Cath remain.  Original Report Authenticated By: Juline Patch, M.D.    Discussed results with pt over phone.  Pneumonia has cleared.  No change to current treatment plan.

## 2011-05-24 NOTE — Procedures (Signed)
EEG NUMBER:  [id number].  This routine EEG was requested in this 66 year old woman who is having spells of confusion.  She is on gabapentin.  The EEG was done with the patient awake and drowsy.  During periods of maximal wakefulness, she had an 8.5 cycle per second posterior dominant rhythm that attenuated with eye opening and was symmetric.  Background activities were composed mainly of low amplitude alpha activities that were symmetric.  Note was made of right parietal theta and delta range slowing that was persistent throughout the recording.  Sometimes this seemed to extend into the left posterior quadrant.  However, it was the highest amplitude and slowest frequencies in the right side.  Photic stimulation produced a driving response that seemed to be best seen over the left temporal chain.  Hyperventilation was not performed.  The patient did become drowsy as characterized by the onset of slow roving eye movements, some attenuation of alpha activity and attenuation of muscle activity.  CLINICAL INTERPRETATION:  This routine EEG done when the patient awake and drowsy is abnormal.  The persistent theta and delta range slowing seen best in the right parietal region suggest an underlying area of functional or structural abnormality.  No interictal epileptiform discharges were seen.          ______________________________ Denton Meek, MD    [id number] D:  05/23/2011 17:19:16  T:  05/24/2011 04:49:55  Job #:  [id number]

## 2011-05-28 ENCOUNTER — Other Ambulatory Visit: Payer: Self-pay | Admitting: *Deleted

## 2011-05-28 MED ORDER — PANTOPRAZOLE SODIUM 40 MG PO TBEC: 40.0000 mg | DELAYED_RELEASE_TABLET | ORAL | Status: DC

## 2011-05-29 ENCOUNTER — Ambulatory Visit (INDEPENDENT_AMBULATORY_CARE_PROVIDER_SITE_OTHER): Payer: Medicare Other | Admitting: Internal Medicine

## 2011-05-29 DIAGNOSIS — E538 Deficiency of other specified B group vitamins: Secondary | ICD-10-CM

## 2011-05-30 ENCOUNTER — Other Ambulatory Visit: Payer: Self-pay | Admitting: *Deleted

## 2011-05-30 MED ORDER — AMIODARONE HCL 200 MG PO TABS: 200.0000 mg | ORAL_TABLET | ORAL | Status: DC

## 2011-06-01 ENCOUNTER — Encounter: Payer: Self-pay | Admitting: Internal Medicine

## 2011-06-04 ENCOUNTER — Ambulatory Visit (INDEPENDENT_AMBULATORY_CARE_PROVIDER_SITE_OTHER): Payer: Medicare Other | Admitting: Cardiology

## 2011-06-04 ENCOUNTER — Encounter: Payer: Self-pay | Admitting: Cardiology

## 2011-06-04 ENCOUNTER — Other Ambulatory Visit: Payer: Self-pay | Admitting: Neurology

## 2011-06-04 ENCOUNTER — Ambulatory Visit (INDEPENDENT_AMBULATORY_CARE_PROVIDER_SITE_OTHER)
Admission: RE | Admit: 2011-06-04 | Discharge: 2011-06-04 | Disposition: A | Discharge status: Home / Self Care | Payer: Medicare Other | Source: Ambulatory Visit | Attending: Neurology | Admitting: Neurology

## 2011-06-04 VITALS — BP 112/60 | HR 73 | Ht 62.0 in | Wt 101.0 lb

## 2011-06-04 DIAGNOSIS — W19XXXA Unspecified fall, initial encounter: Secondary | ICD-10-CM

## 2011-06-04 DIAGNOSIS — R531 Weakness: Secondary | ICD-10-CM

## 2011-06-04 DIAGNOSIS — I10 Essential (primary) hypertension: Secondary | ICD-10-CM

## 2011-06-04 DIAGNOSIS — I428 Other cardiomyopathies: Secondary | ICD-10-CM

## 2011-06-04 DIAGNOSIS — R5381 Other malaise: Secondary | ICD-10-CM

## 2011-06-04 DIAGNOSIS — R5383 Other fatigue: Secondary | ICD-10-CM

## 2011-06-04 DIAGNOSIS — I4891 Unspecified atrial fibrillation: Secondary | ICD-10-CM

## 2011-06-04 NOTE — Assessment & Plan Note (Signed)
The blood pressure is at target. No change in medications is indicated. We will continue with therapeutic lifestyle changes (TLC).  

## 2011-06-04 NOTE — Assessment & Plan Note (Signed)
She is maintaining sinus rhythm. I have sent a message to both Dr. Craige Cotta and Dr. Juanda Chance to make sure that they are okay from a pulmonary and GI standpoint with continuing amiodarone. I would prefer to continue with unless there is a strong contraindication in their minds.

## 2011-06-04 NOTE — Patient Instructions (Signed)
The current medical regimen is effective;  continue present plan and medications.  Follow up in 6 months with Dr Hochrein.  You will receive a letter in the mail 2 months before you are due.  Please call us when you receive this letter to schedule your follow up appointment.  

## 2011-06-04 NOTE — Progress Notes (Signed)
HPI She presents for followup of a nonischemic cardiomyopathy. She has a history of a nonischemic cardiomyopathy though her last ejection fraction was 40-50%. She has a CRT. She has multiple medical problems and currently actually is receiving TPN.   She presents for followup. She has had 2 episodes of chest discomfort in the past 3 months. The last was about a month ago. This happens at rest. She gets some left-sided and epigastric discomfort with at times radiation to her neck. She did take a nitroglycerin on both occasions. However, she's had no recurrence of this in the last few weeks. She's very limited because of her oxygenation but she can do some walking to the mailbox without bringing on chest discomfort. She does not describe nausea or vomiting. She has not had PND or orthopnea. She denies palpitations, presyncope or syncope.   I do note that her liver enzymes recently drawn remain elevated. She is due to see Dr. Juanda Chance tomorrow.   Allergies  Allergen Reactions  . Pradaxa Other (See Comments)    LOW BLOOD COUNT  . Sulfonamide Derivatives Other (See Comments)    SHAKE   . Talwin Other (See Comments)    hallucination    Current Outpatient Prescriptions  Medication Sig Dispense Refill  . albuterol (PROAIR HFA) 108 (90 BASE) MCG/ACT inhaler Inhale 2 puffs into the lungs every 6 (six) hours as needed for wheezing.  1 Inhaler  5  . amiodarone (PACERONE) 200 MG tablet Take 1 tablet (200 mg total) by mouth every morning.  30 tablet  6  . carvedilol (COREG) 6.25 MG tablet Take 1 tablet (6.25 mg total) by mouth 2 (two) times daily with a meal.  60 tablet  4  . cyclobenzaprine (FLEXERIL) 10 MG tablet TAKE 1 TABLET 3 TIMES DAILY AS NEEDED  60 tablet  1  . denosumab (PROLIA) 60 MG/ML SOLN Inject 60 mg into the skin every 6 (six) months. Twice a year      . etodolac (LODINE) 400 MG tablet Take 400 mg by mouth 2 (two) times daily.       . fentaNYL (DURAGESIC - DOSED MCG/HR) 50 MCG/HR Place  1 patch (50 mcg total) onto the skin every 3 (three) days.  10 patch  0  . furosemide (LASIX) 20 MG tablet Take 1 tablet (20 mg total) by mouth every morning.  30 tablet  6  . gabapentin (NEURONTIN) 600 MG tablet TAKE 1 TABLET 4 TIMES DAILY  120 tablet  5  . iron dextran complex (INFED) 50 MG/ML injection Please give infed infusion (no test dose needed) over 4 hours per pharmacy calculated dose. Ht: 5'2 Wt: 99 pounds Hgb: 12  1 mL  0  . nitroGLYCERIN (NITROSTAT) 0.4 MG SL tablet Place 1 tablet (0.4 mg total) under the tongue every 5 (five) minutes as needed for chest pain. CHEST PAIN   25 tablet  11  . oxyCODONE-acetaminophen (PERCOCET) 10-325 MG per tablet Take 1 tablet by mouth every 4 (four) hours as needed. PAIN  120 tablet  0  . pantoprazole (PROTONIX) 40 MG tablet Take 1 tablet (40 mg total) by mouth every morning.  30 tablet  1  . promethazine (PHENERGAN) 25 MG tablet Take 1 tablet (25 mg total) by mouth every 8 (eight) hours as needed for nausea.  40 tablet  0  . temazepam (RESTORIL) 15 MG capsule 1-2 tablets by mouth at bedtime as needed  60 capsule  0  . tiotropium (SPIRIVA HANDIHALER) 18 MCG  inhalation capsule Place 18 mcg into inhaler and inhale every morning.       . TPN ADULT Inject into the vein every 12 (twelve) hours. tpn 700 pm to 0700 am daily via right chest port a cath       Current Facility-Administered Medications  Medication Dose Route Frequency Provider Last Rate Last Dose  . cyanocobalamin ((VITAMIN B-12)) injection 1,000 mcg  1,000 mcg Intramuscular Q30 days Hart Carwin, MD   1,000 mcg at 05/29/11 1137    Past Medical History  Diagnosis Date  . Small bowel obstruction   . HTN (hypertension)   . Systolic CHF, chronic   . GERD (gastroesophageal reflux disease)   . CAD (coronary artery disease)     mild disease per cath in 2008  . LBBB (left bundle branch block)   . Atrial fibrillation     on amiodarone  . Venous embolism and thrombosis of subclavian vein      after pacemaker insertion Oct 2011  . Diverticulosis   . COPD (chronic obstructive pulmonary disease)     -06/28/08 PFT FEV1 1.95 (97%), FVC 2.79 (101%), FEV1% 70, TLC 3.88 (87%), DLCO 64%  . Pleural effusion      s/p right thoracentesis 03/09  . Small bowel obstruction   . DDD (degenerative disc disease)   . Urinary incontinence     has pacemaker stimulator in her bladder  . RLS (restless legs syndrome)   . Primary dilated cardiomyopathy     EF 45 to 50% per echo in Jan 2012  . Vitamin B12 deficiency   . Gastroparesis   . Chronic nausea   . Chronic abdominal pain   . Chronic pain syndrome   . Gastrocutaneous fistula   . Pacemaker     CRT therapy; followed by Dr. Graciela Husbands  . H/O: GI bleed     from Pradaxa  . Oxygen dependent     2 liters via [place] all the time  . CHF (congestive heart failure)   . Pneumonia - 03/09, 12/09, 02/10, 06/10  . Anemia   . Fibromyalgia   . Hx of stress fx aug 2011    right hip   . Elbow fracture, left aug 2012    Past Surgical History  Procedure Date  . Appendectomy   . Bladder surgery   . Total abdominal hysterectomy   . Pacemaker placement     CRT-PPM-Medtronic in October 2011  . Peg placement 2012  . Abdominal adhesion surgery     numerous surgeries, last done 2011  . Peg removed     with complications  . Tonsillectomy age 41  . Colectomy     Intestinal resection (5times),S/P PARTIAL COLECTOMY 2001  . Cardiac catheterization 2008  . Interstim implant revision 03/06/2011    Procedure: REVISION OF Leane Platt;  Surgeon: Martina Sinner, MD;  Location: [facility];  Service: Urology;  Laterality: N/A;  Replacement of Neurostimulator    ROS: As stated in the HPI and negative for all other systems.  PHYSICAL EXAM BP 112/60  Pulse 73  Ht 5\' 2"  (1.575 m)  Wt 101 lb (45.813 kg)  BMI 18.47 kg/m2 GENERAL:  Frail appearing HEENT:  Pupils equal round and reactive, fundi not visualized, oral mucosa unremarkable NECK:  No jugular venous  distention, waveform within normal limits, carotid upstroke brisk and symmetric, no bruits, no thyromegaly LYMPHATICS:  No cervical, inguinal adenopathy LUNGS:  Clear to auscultation bilaterally BACK:  No CVA tenderness CHEST:  Port a cath,  CRT pocket HEART:  PMI not displaced or sustained,S1 and S2 within normal limits, no S3, no S4, no clicks, no rubs, no murmurs ABD:  Flat, positive bowel sounds normal in frequency in pitch, no bruits, no rebound, no guarding, no midline pulsatile mass, no hepatomegaly, no splenomegaly EXT:  2 plus pulses throughout, no edema, no cyanosis no clubbing   EKG:  Sinus with ventricular pacing 100% capture, rate 73  .06/04/2011  ASSESSMENT AND PLAN

## 2011-06-04 NOTE — Assessment & Plan Note (Signed)
Her EF is 40%.  She will continue the meds as listed.

## 2011-06-05 ENCOUNTER — Ambulatory Visit (INDEPENDENT_AMBULATORY_CARE_PROVIDER_SITE_OTHER): Payer: Medicare Other | Admitting: Internal Medicine

## 2011-06-05 ENCOUNTER — Encounter: Payer: Self-pay | Admitting: Internal Medicine

## 2011-06-05 ENCOUNTER — Telehealth: Payer: Self-pay | Admitting: Neurology

## 2011-06-05 VITALS — BP 126/68 | HR 64 | Ht 62.0 in | Wt 101.0 lb

## 2011-06-05 DIAGNOSIS — D509 Iron deficiency anemia, unspecified: Secondary | ICD-10-CM

## 2011-06-05 DIAGNOSIS — K56609 Unspecified intestinal obstruction, unspecified as to partial versus complete obstruction: Secondary | ICD-10-CM

## 2011-06-05 DIAGNOSIS — R195 Other fecal abnormalities: Secondary | ICD-10-CM

## 2011-06-05 MED ORDER — PREDNISONE 5 MG PO TABS: 5.0000 mg | ORAL_TABLET | Freq: Every day | ORAL | Status: AC

## 2011-06-05 NOTE — Telephone Encounter (Signed)
Called and spoke with the patient. She states she had the CT yesterday while she was at [facility] seeing her cardiologist. Testing is complete. No other issues or concerns at this time.

## 2011-06-05 NOTE — Telephone Encounter (Signed)
Message copied by Benay Spice on Tue Jun 05, 2011  9:23 AM ------      Message from: Milas Gain      Created: Fri Jun 01, 2011  5:41 PM       could you call and tell patient that EEG looked ok, no obvious cause of the spells, but I would like to get a CT of her head without contrast.  thx

## 2011-06-05 NOTE — Patient Instructions (Addendum)
We have spoken with De Nurse at [facility] to ask that TPN be discontinued at this time. You will keep your port-a-cath and continue to have blood drawn. De Nurse states that he will contact you regarding the TPN and how to taper it. (spoke with De Nurse @ 3:26 pm on 06/05/11-Eulalah Rupert, cma) We have sent the following medications to your pharmacy for you to pick up at your convenience: Prednisone 5 mg daily. Follow up with Dr Juanda Chance in 3 months. CC: Dr Loreen Freud, Dr Craige Cotta, Dr Antoine Poche

## 2011-06-05 NOTE — Progress Notes (Signed)
[name] 22-Mar-1945 MRN [id number]   History of Present Illness:  This is a 66 year old white female with chronic small bowel obstruction due to adhesive disease. She is status post subtotal colectomy in 2001 and an exploratory laparotomy in February 2011 with unsuccessful relief of obstruction due to severe adhesive disease. She has been on home TNA  to  via Port-A-Cath but she claims that the TPN is making her nauseated. Her liver function tests have been abnormal. She has a history of a GI bleed while being on Pradaxa for atrial fibrillation. She has had chronic respiratory failure, COPD and emphysema. She is on nasal oxygen. She has chronic GI blood loss from an stricture which was documented on a recent colonoscopy. She was given a short course of steroids with some improvement. She has been able to maintain her weight around 100 lbs eating small meals. She would like to stop her TPN because of nausea as well as the expense. Her albumin has been 2.9 and her hemoglobin 9.8. She has been getting weekly labs.Last blood transfusion 2 months ago.  Past Medical History  Diagnosis Date  . Small bowel obstruction   . HTN (hypertension)   . Systolic CHF, chronic   . GERD (gastroesophageal reflux disease)   . CAD (coronary artery disease)     mild disease per cath in 2011  . LBBB (left bundle branch block)   . Atrial fibrillation     on amiodarone  . Venous embolism and thrombosis of subclavian vein     after pacemaker insertion Oct 2011  . Diverticulosis   . COPD (chronic obstructive pulmonary disease)     -06/28/08 PFT FEV1 1.95 (97%), FVC 2.79 (101%), FEV1% 70, TLC 3.88 (87%), DLCO 64%  . Pleural effusion      s/p right thoracentesis 03/09  . Small bowel obstruction   . DDD (degenerative disc disease)   . Urinary incontinence     has pacemaker stimulator in her bladder  . RLS (restless legs syndrome)   . Primary dilated cardiomyopathy     EF 45 to 50% per echo in Jan 2012  .  Vitamin B12 deficiency   . Gastroparesis   . Chronic nausea   . Chronic abdominal pain   . Chronic pain syndrome   . Gastrocutaneous fistula   . Pacemaker     CRT therapy; followed by Dr. Graciela Husbands  . H/O: GI bleed     from Pradaxa  . Oxygen dependent     2 liters via [place] all the time  . CHF (congestive heart failure)   . Pneumonia - 03/09, 12/09, 02/10, 06/10  . Anemia   . Fibromyalgia   . Hx of stress fx aug 2011    right hip   . Elbow fracture, left aug 2012   Past Surgical History  Procedure Date  . Appendectomy   . Bladder surgery   . Total abdominal hysterectomy   . Pacemaker placement     CRT-PPM-Medtronic in October 2011  . Peg placement 2012  . Abdominal adhesion surgery     numerous surgeries, last done 2011  . Peg removed     with complications  . Tonsillectomy age 64  . Colectomy     Intestinal resection (5times),S/P PARTIAL COLECTOMY 2001  . Cardiac catheterization 2008  . Interstim implant revision 03/06/2011    Procedure: REVISION OF Leane Platt;  Surgeon: Martina Sinner, MD;  Location: [facility];  Service: Urology;  Laterality: N/A;  Replacement of Neurostimulator  reports that she quit smoking about 25 years ago. Her smoking use included Cigarettes. She has a 20 pack-year smoking history. She has never used smokeless tobacco. She reports that she does not drink alcohol or use illicit drugs. family history includes Breast cancer in her maternal aunt; Diabetes in her paternal grandfather; Heart disease in her brother, father, mother, and paternal aunt; and Stroke in her mother.  There is no history of Colon cancer. Allergies  Allergen Reactions  . Pradaxa Other (See Comments)    LOW BLOOD COUNT  . Sulfonamide Derivatives Other (See Comments)    SHAKE   . Talwin Other (See Comments)    hallucination        Review of Systems: Positive for nausea, abdominal distention decreased level of energy and chronic diarrhea  The remainder of the 10 point ROS is  negative except as outlined in H&P   Physical Exam: General appearance  Well developed, in no distress. Eyes- non icteric. HEENT nontraumatic, normocephalic. Mouth no lesions, tongue papillated, no cheilosis. Neck supple without adenopathy, thyroid not enlarged, no carotid bruits, no JVD. Lungs Clear to auscultation bilaterally. Port-A-Cath right subclavian area Cor normal S1, normal S2, regular rhythm, no murmur,  quiet precordium. Abdomen: Distended firm abdomen with normal bowel sounds consistent with a partial obstruction, nontender. Rectal: Not done today. Extremities no pedal edema. Skin no lesions. Neurological alert and oriented x 3. Psychological normal mood and affect.  Assessment and Plan:  Problem #1 Chronic low-grade small bowel obstruction due to adhesive disease. Somehow, she has been able to eat and maintain her weight. She has not been using her TPN as she is supposed to. According to her, it makes her nauseated and sick the next day. We will stop the TPN at this point for at least a month and reassess whether she is able to maintain her nutrition without it. We will monitor her nutritional parameters.  Problem #2 Chronic low-grade GI blood loss from the GI tract probably from the ileocolic anastomoses. She has maintained her blood count since last visit and therefore I would like to keep her on low-dose prednisone 5 mg daily. It may also improve her breathing as well as appetite. I will see her in 3 months   06/05/2011 [name]

## 2011-06-06 ENCOUNTER — Telehealth: Payer: Self-pay | Admitting: Internal Medicine

## 2011-06-06 IMAGING — CT CT ABD-PELV W/O CM
2 of 4 series · 16 of 46 positions shown, 18 images · non-contrast
Comparison: CT abdomen and pelvis 02/22/2009.

CLINICAL DATA: Patient with enterocutaneous fistula.  History of
multiple bowel adhesions and bowel obstructions.

CT ABDOMEN AND PELVIS WITHOUT CONTRAST
TECHNIQUE: Multidetector CT imaging of the abdomen and pelvis was
performed following the standard protocol without intravenous
contrast.

[Series 2: rtn a/p w/o · axial · non-contrast · 0.55mm/px · z∈[-500,-150]mm · 13 of 76 slices shown, 15 images]
[im 3/76  soft-tissue]
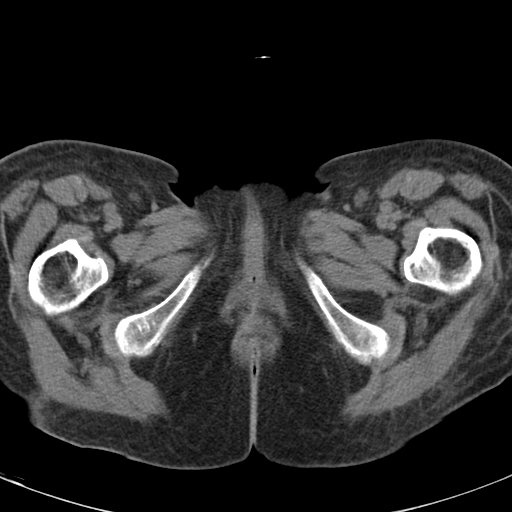
[im 3/76  bone]
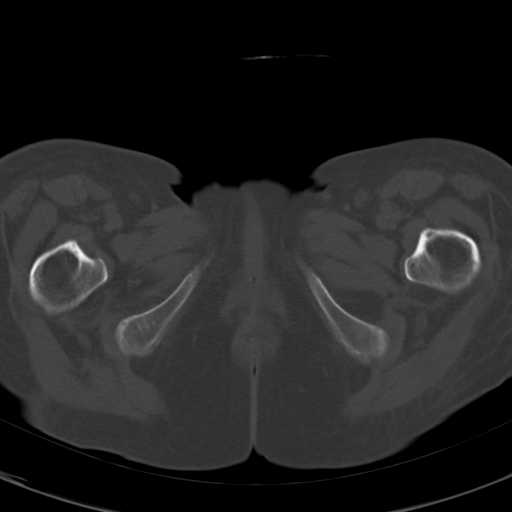
[im 9/76  soft-tissue]
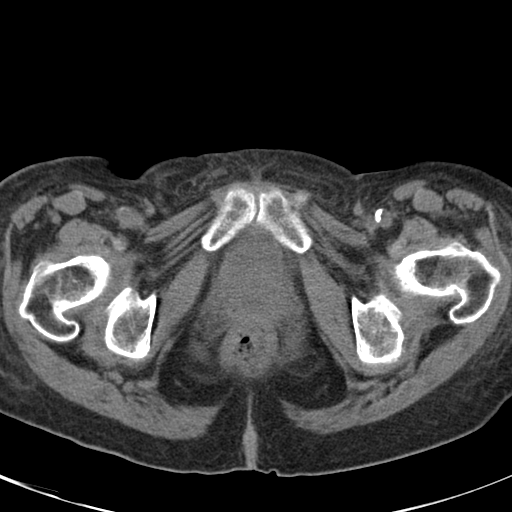
[im 15/76  soft-tissue]
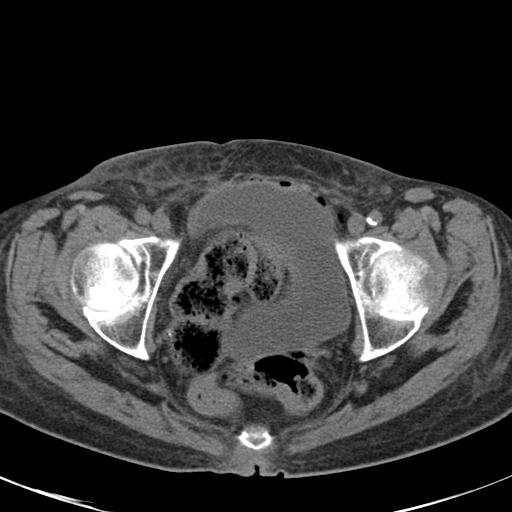
[im 21/76  soft-tissue]
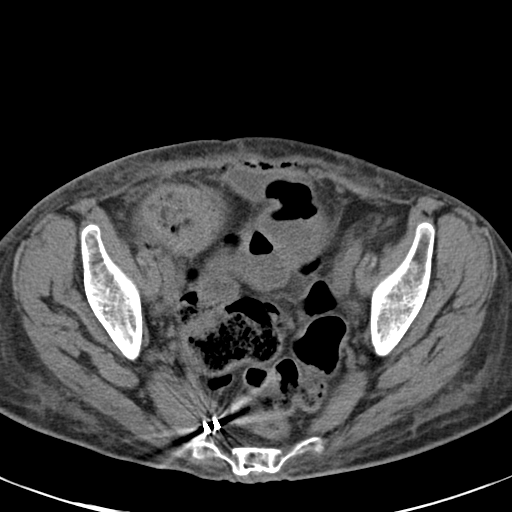
[im 26/76  soft-tissue]
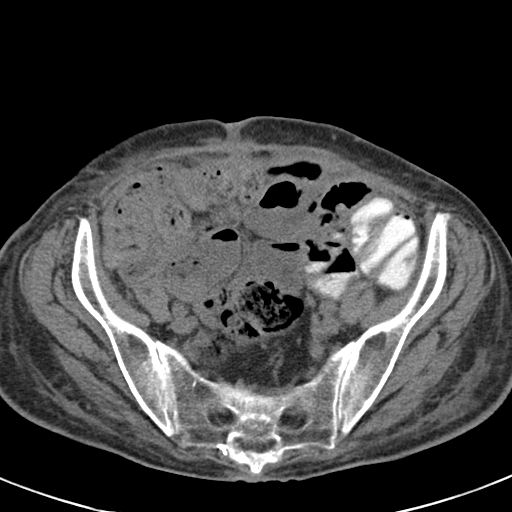
[im 32/76  soft-tissue]
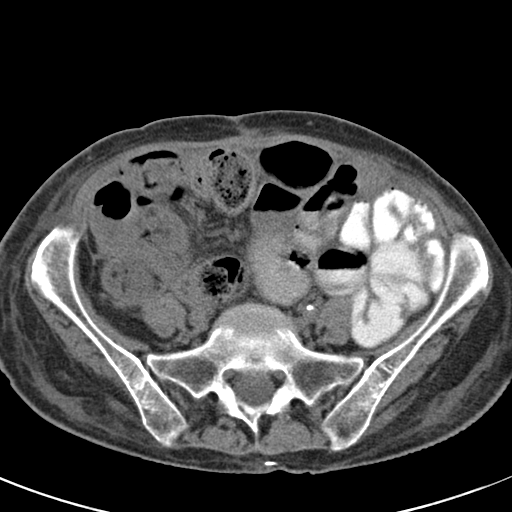
[im 38/76  soft-tissue]
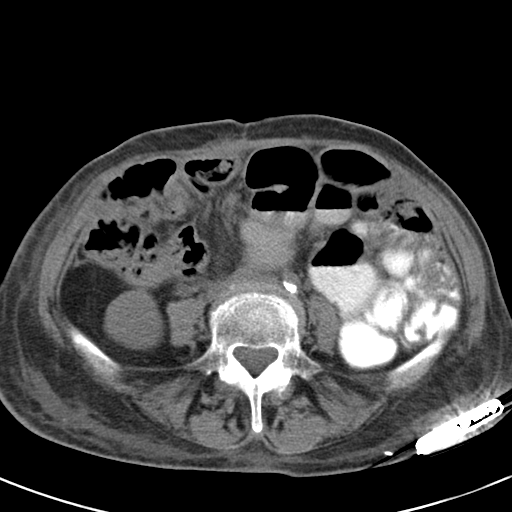
[im 44/76  soft-tissue]
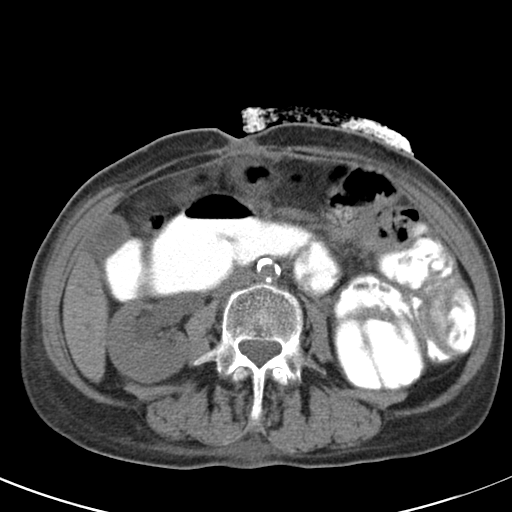
[im 50/76  soft-tissue]
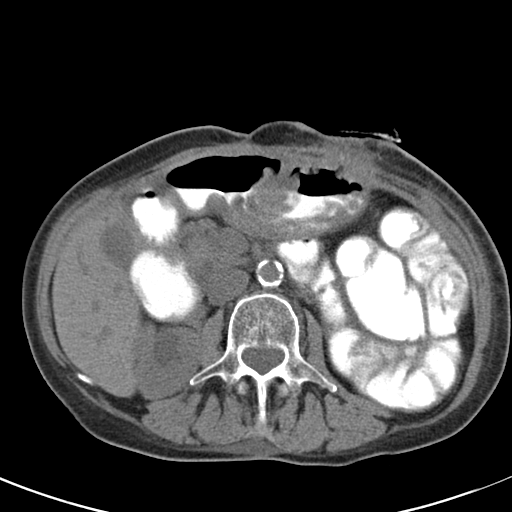
[im 50/76  bone]
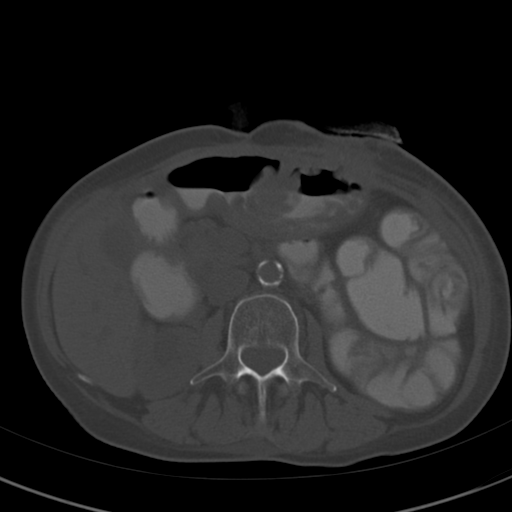
[im 55/76  soft-tissue]
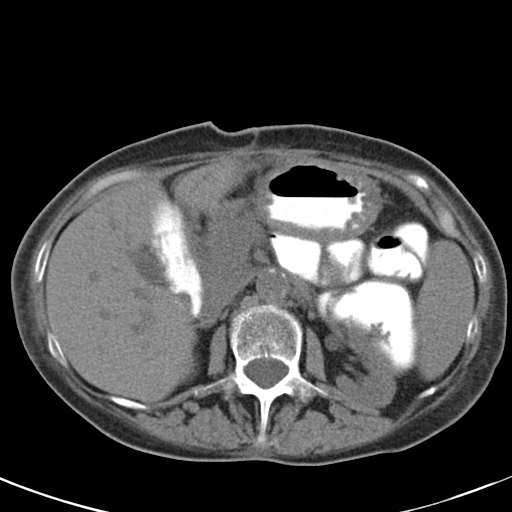
[im 61/76  soft-tissue]
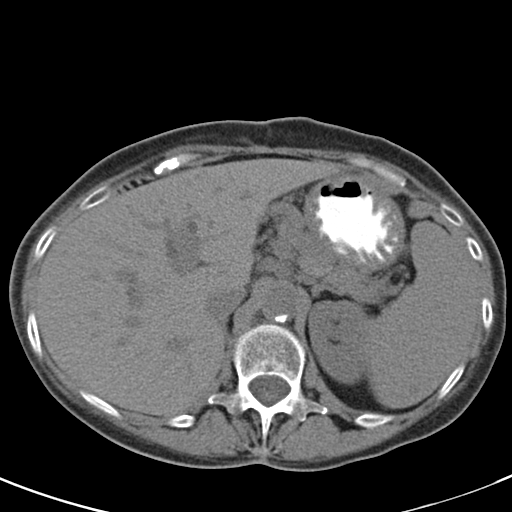
[im 67/76  soft-tissue]
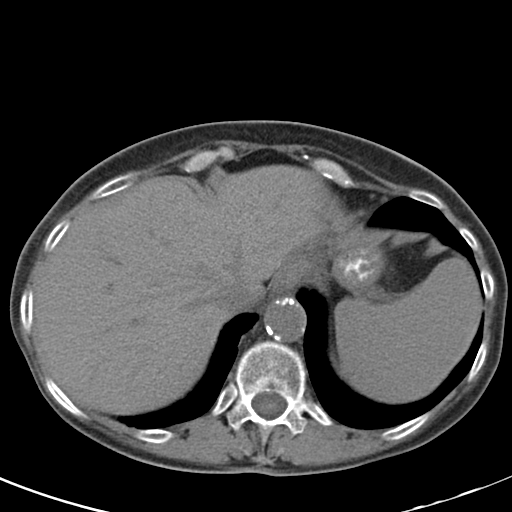
[im 73/76  soft-tissue]
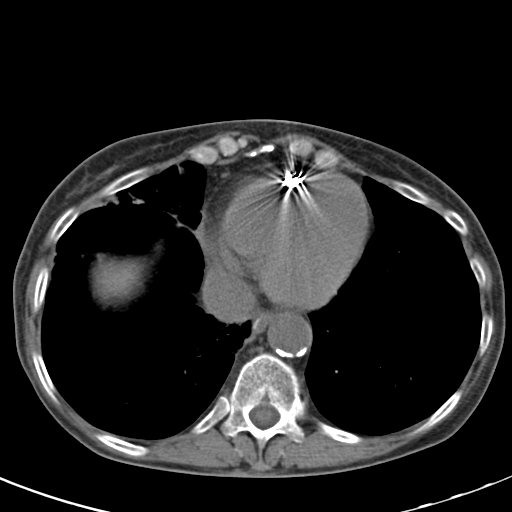

[Series 602: <mpr thick range> · coronal · 0.74mm/px · 3 of 73 slices shown]
[im 25/73  soft-tissue]
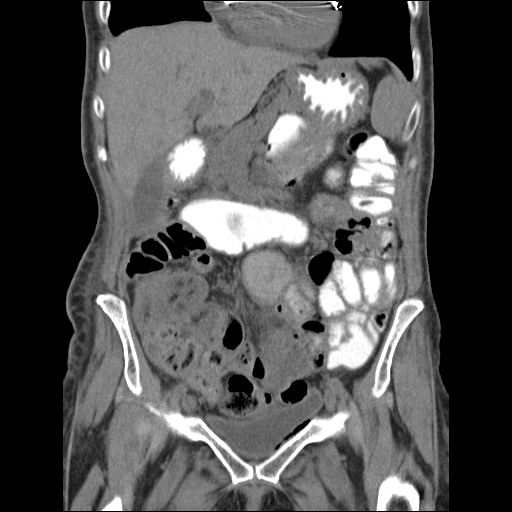
[im 33/73  soft-tissue]
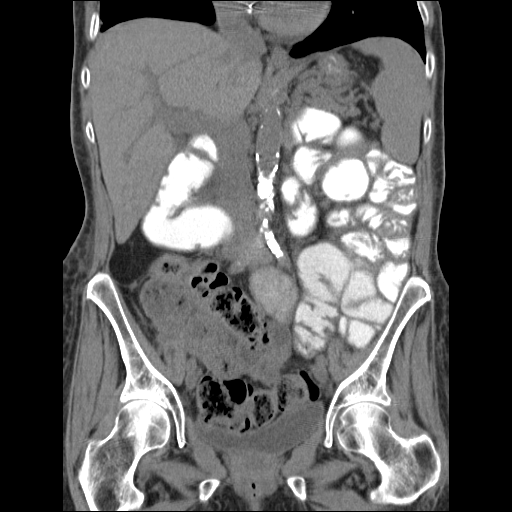
[im 41/73  soft-tissue]
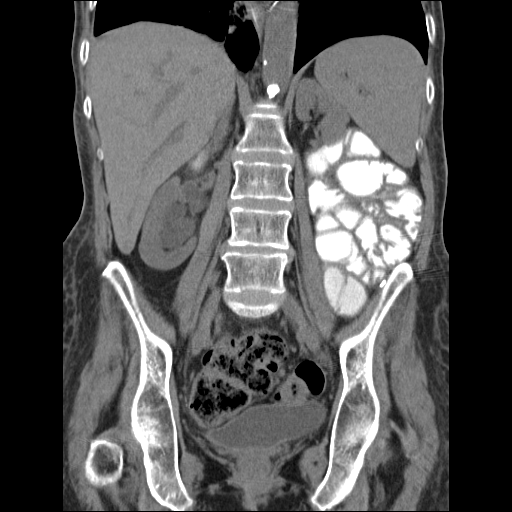

[16 of 46 positions shown; findings below may reference images not displayed]

FINDINGS: Extensive micronodular opacity is present in the right
middle and lower lobes, more prominent in the right middle lobe.
Minimal similar changes are seen in the lingula.  Lung bases
otherwise unremarkable.  No pleural or pericardial effusion.

An attempt was made to evaluate for gastrocutaneous patient under
fluoroscopy.  No fistula could be localized.  Therefore, the
patient was given oral contrast material and CT was performed.  The
anterior wall of the stomach appears adhesed to the anterior
abdominal wall. A small, subtle fistula tract is identified at the
site of the patient's ostomy wound.  Tract measures only 1-2 mm in
diameter and approximately 1.4 cm AP.  The walls of the body of the
stomach appear thickened. There is air about the urinary bladder
which appears extraperitoneal and likely related to the patient's
fistula.

The uninfused appearance of the gallbladder, spleen, adrenal
glands, left kidney and pancreas is normal.  The patient has
moderate right hydronephrosis.  Cause for obstruction is not
identified.  There is no left hydronephrosis. There is no evidence
of bowel obstruction.  There is mixed fairly extensive infiltration
of subcutaneous fat diffusely. No focal bony abnormality.  Spinal
stimulator noted.
IMPRESSION: 1.  Small gastrocutaneous fistula as described above.
2.  The walls of the body of the stomach appear thickened and the
stomach appears adhesed to the anterior wall.  This may be
inflammatory but neoplastic process could create a similar
appearance.
3.  Right hydronephrosis progressive since [DATE].  Cause for
obstruction is not identified.
4.  Unchanged intrahepatic biliary ductal dilatation.
5.  Micronodular opacities in the right middle and lower lobes may
be due to old infectious or inflammatory process.

## 2011-06-06 NOTE — Telephone Encounter (Signed)
Spoke with Archer Asa the pharmacist for [facility] about stopping TPN. She will finish up what she has on hand every other day. He will send over the orders for this.

## 2011-06-07 ENCOUNTER — Telehealth: Payer: Self-pay | Admitting: Family Medicine

## 2011-06-07 ENCOUNTER — Other Ambulatory Visit: Payer: Self-pay | Admitting: Internal Medicine

## 2011-06-07 NOTE — Telephone Encounter (Signed)
Refill x1 

## 2011-06-07 NOTE — Telephone Encounter (Signed)
Oxycodone 10-325mg  and pain patch (do not know name) senteanyl maybe? Call when ready to pick up.

## 2011-06-07 NOTE — Telephone Encounter (Signed)
Last filled 05-01-11 #120, last OV 06-05-11

## 2011-06-08 ENCOUNTER — Other Ambulatory Visit: Payer: Self-pay | Admitting: *Deleted

## 2011-06-08 ENCOUNTER — Ambulatory Visit: Payer: Medicare Other | Admitting: Internal Medicine

## 2011-06-08 MED ORDER — OXYCODONE-ACETAMINOPHEN 10-325 MG PO TABS: 1.0000 | ORAL_TABLET | ORAL | Status: DC | PRN

## 2011-06-08 NOTE — Telephone Encounter (Signed)
msg left to call back.   KP 

## 2011-06-12 ENCOUNTER — Telehealth: Payer: Self-pay | Admitting: Neurology

## 2011-06-12 NOTE — Telephone Encounter (Signed)
Called and spoke with the patient. Information given re: normal head CT. No other issues voiced at this time.

## 2011-06-12 NOTE — Telephone Encounter (Signed)
Message copied by Benay Spice on Tue Jun 12, 2011  8:38 AM ------      Message from: Milas Gain      Created: Mon Jun 11, 2011 11:58 PM       Let Ms. Antenucci know that her CT was normal.

## 2011-06-14 IMAGING — CR DG CHEST 2V
2 series · 2 of 2 positions shown · non-contrast
Comparison: 12/27/2009

CLINICAL DATA: COPD.  Recurrent pneumonia.

CHEST - 2 VIEW

[view not recorded (1 of 2)]
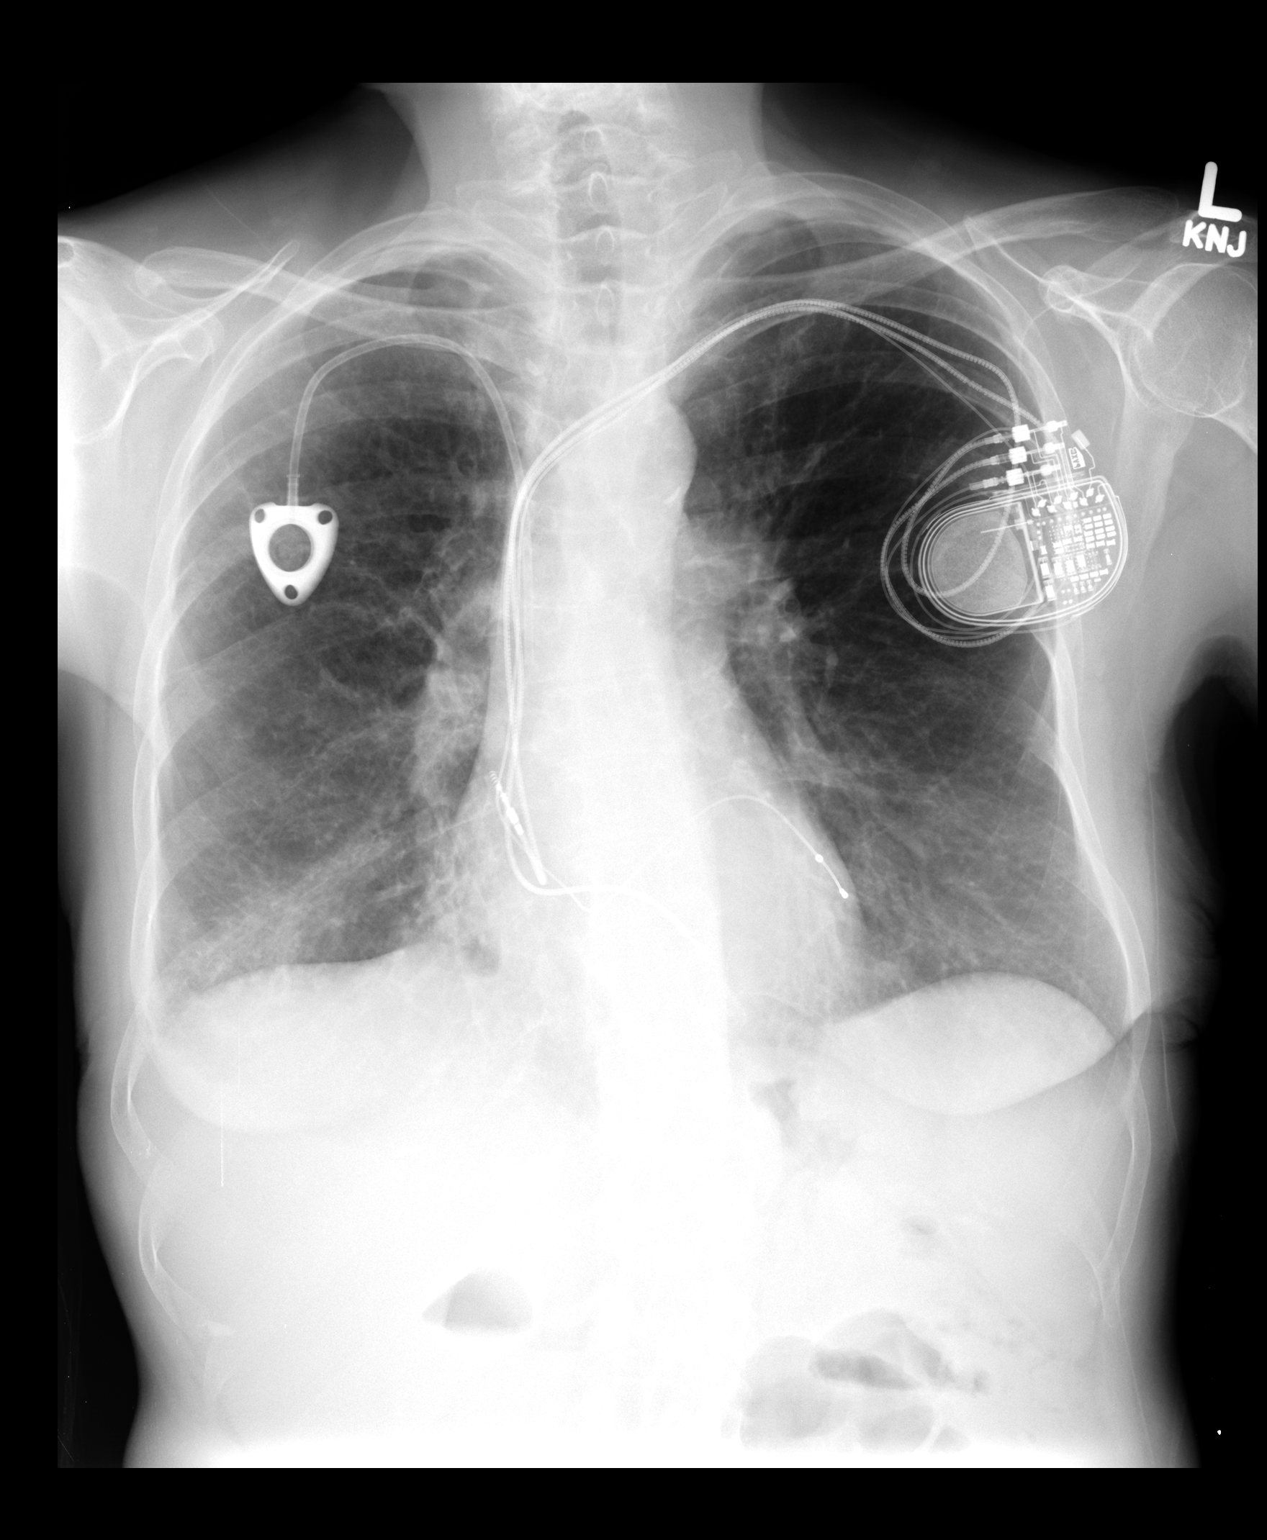

[view not recorded (2 of 2)]
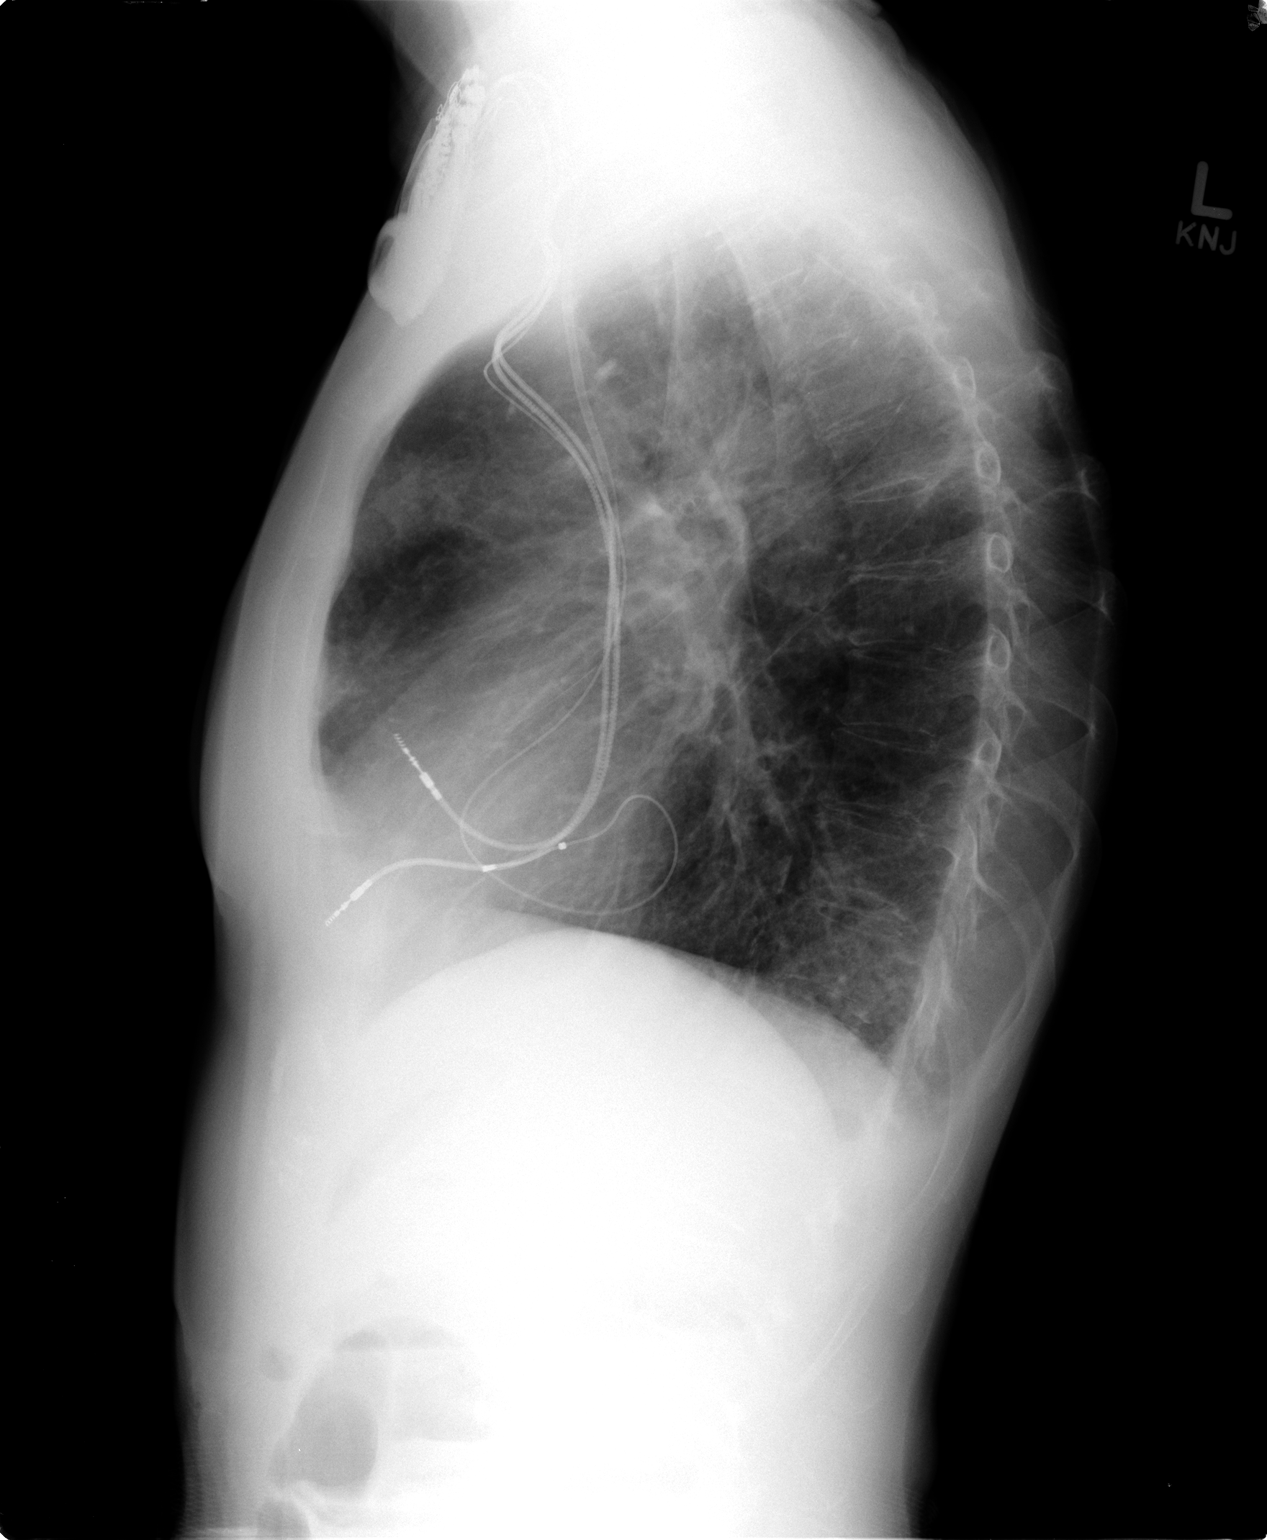

[2 of 2 positions shown; findings below may reference images not displayed]

FINDINGS: Lungs are hyperexpanded consistent with emphysema.  There
is some patchy airspace disease at the right lung base.  No
substantial pleural effusion. The cardiopericardial silhouette is
within normal limits for size.  The right Port-A-Cath is again
noted with tip positioned at the distal SVC level.  A left triple
lead permanent pacemaker remains in place.
IMPRESSION: Airspace disease at the right base is suspicious for pneumonia.

## 2011-06-15 IMAGING — CT CT CHEST W/O CM
2 of 3 series · 15 of 36 positions shown, 18 images · IV contrast (Omnipaque 300)
Comparison: CT scan dated December 24, 2009 and chest x-ray from
yesterday.

CLINICAL DATA: Recurrent pneumonia; shortness of breath; COPD with
hypoxemia

CT CHEST WITHOUT CONTRAST
TECHNIQUE: Multidetector CT imaging of the chest was performed
following the standard protocol without IV contrast.

[Series 2: chest routine with · axial · 0.60mm/px · z∈[-224,+0]mm · 12 of 53 slices shown, 15 images]
[im 4/53  mediastinal]
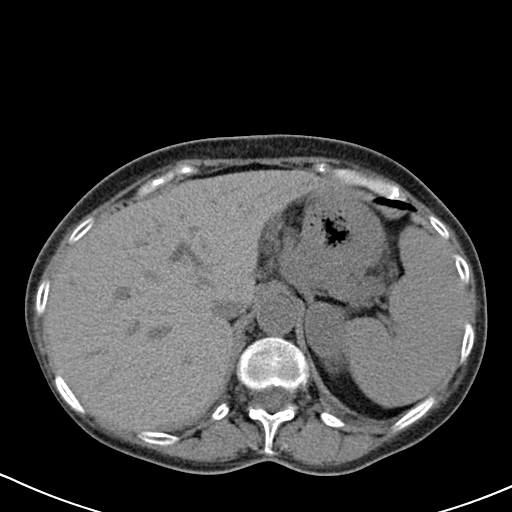
[im 4/53  lung]
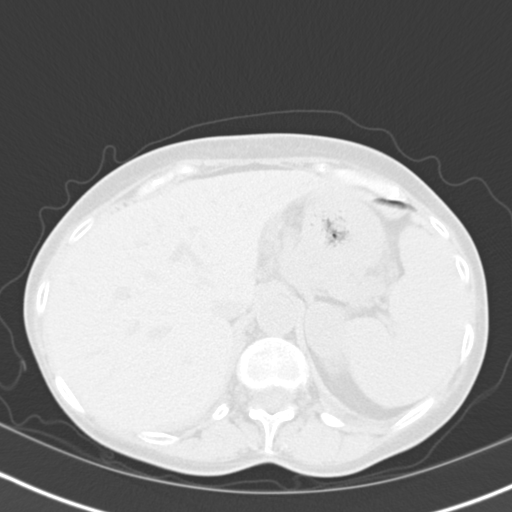
[im 8/53  lung]
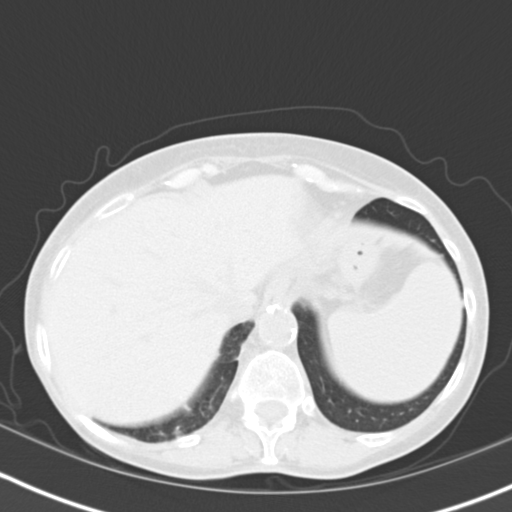
[im 12/53  lung]
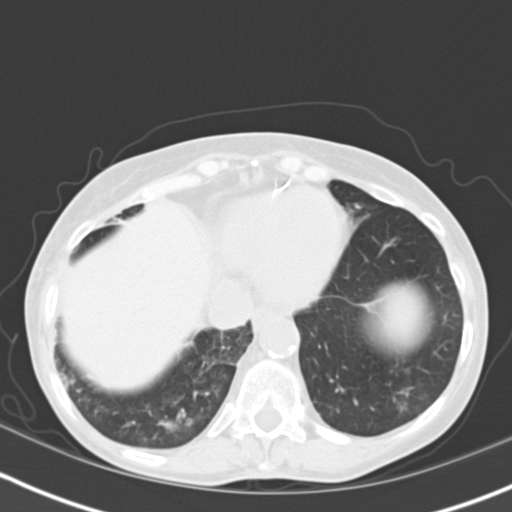
[im 16/53  lung]
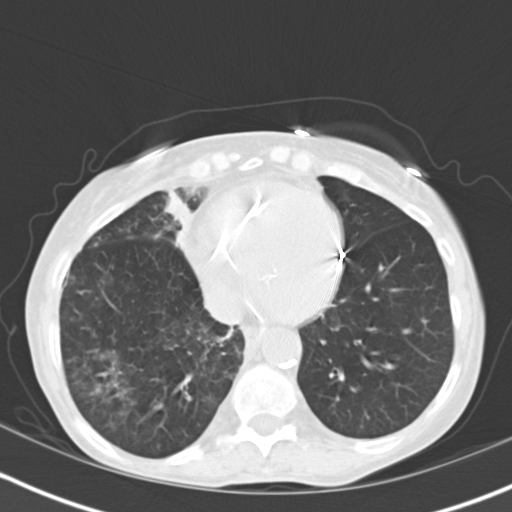
[im 20/53  mediastinal]
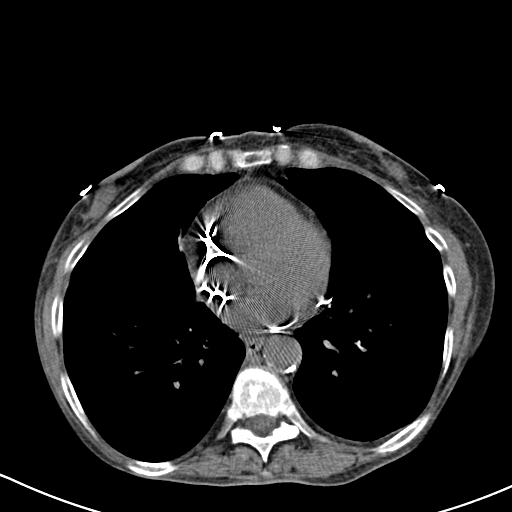
[im 20/53  lung]
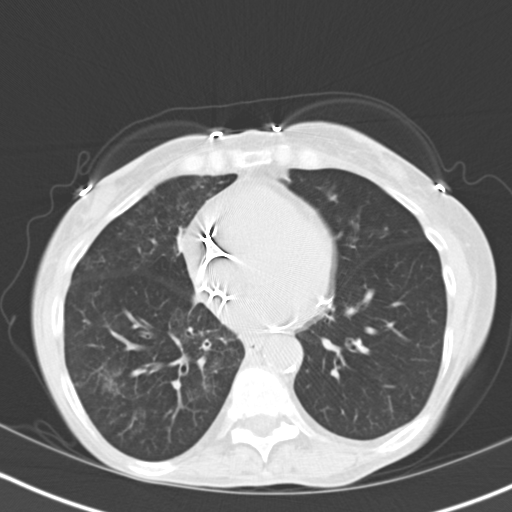
[im 24/53  lung]
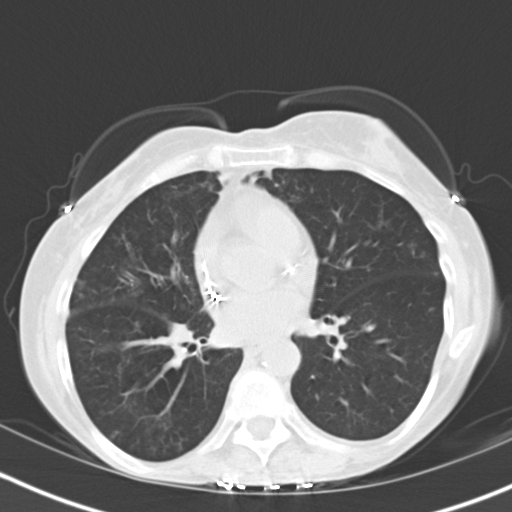
[im 29/53  lung]
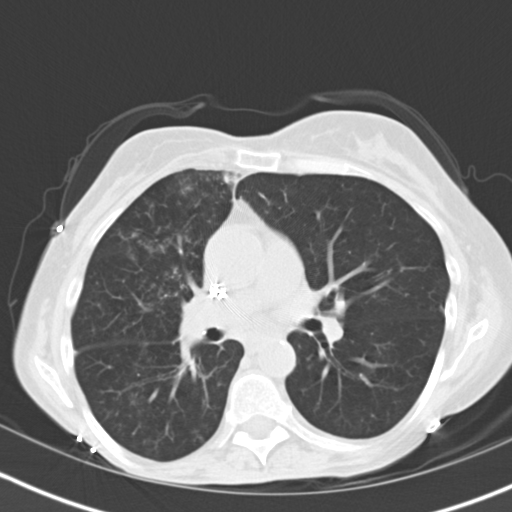
[im 33/53  lung]
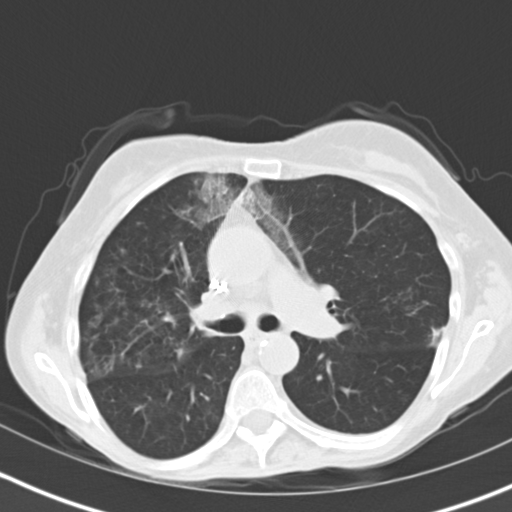
[im 37/53  mediastinal]
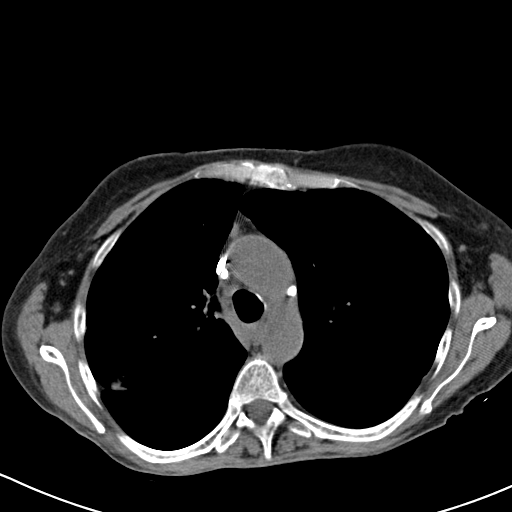
[im 37/53  lung]
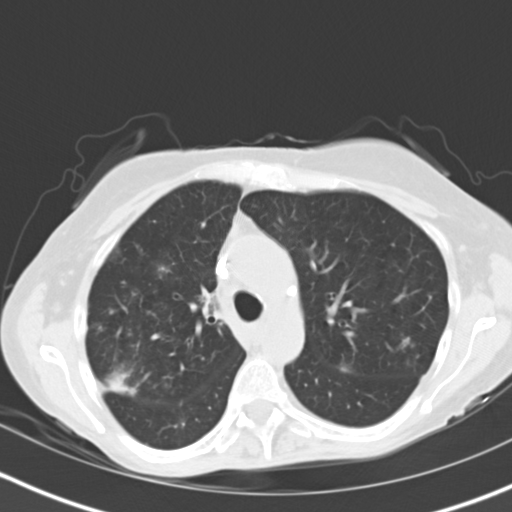
[im 41/53  lung]
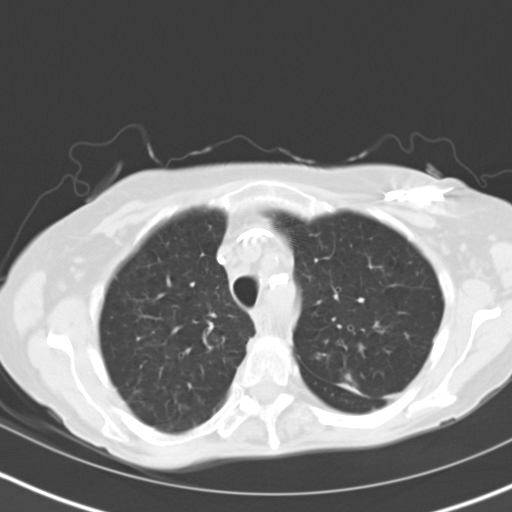
[im 45/53  lung]
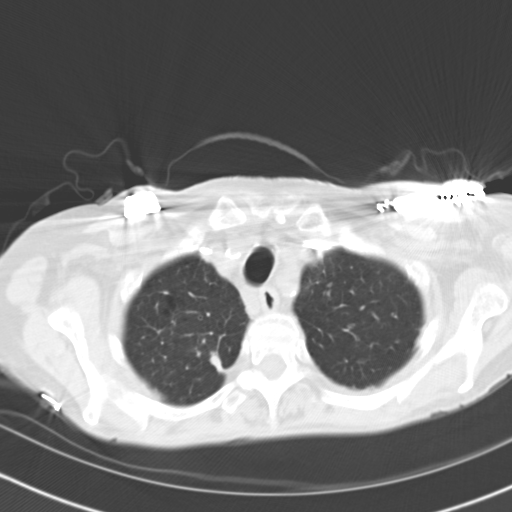
[im 49/53  lung]
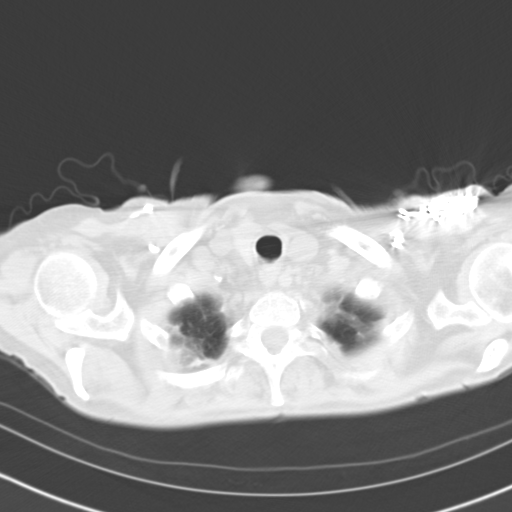

[Series 602: cor · coronal · 0.60mm/px · 3 of 94 slices shown]
[im 19/94  lung]
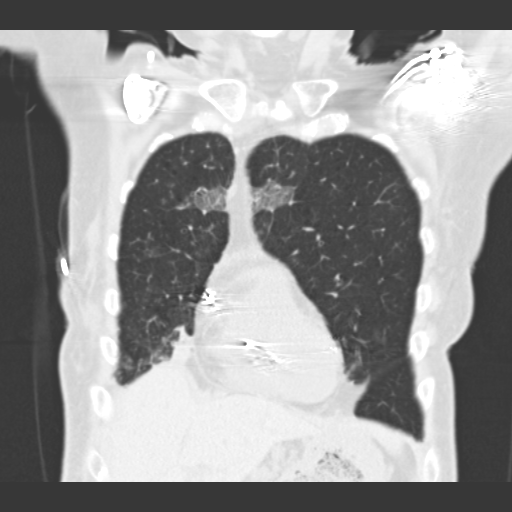
[im 38/94  lung]
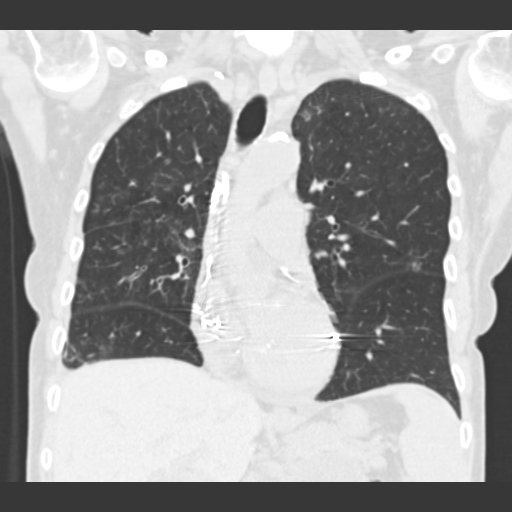
[im 56/94  lung]
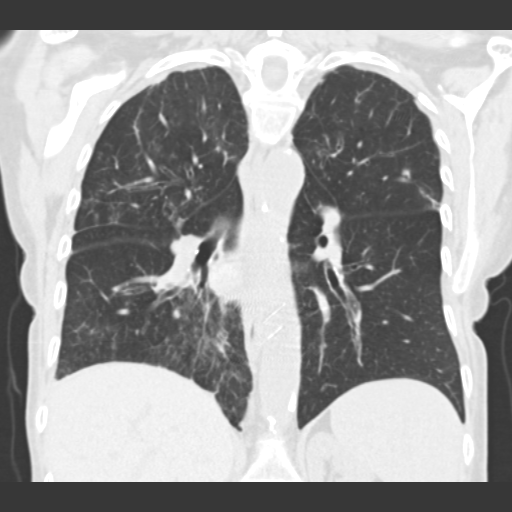

[15 of 36 positions shown; findings below may reference images not displayed]

FINDINGS: Patchy areas of mild airspace opacity are again noted
which are most prominent in the medial aspects of both upper lobes
and the right middle lobe.  The appearance of the right base has
improved compared with the prior study. The opacity in the right
middle lobe is more consolidated than seen previously.  Pleural
parenchymal scarring at the major fissure on the right is
unchanged.  Bronchiectasis is present and most severe in the mid -
posterior right upper lobe.  There are no pleural effusions.

There is no axillary, hilar, mediastinal adenopathy.  The
visualized upper abdomen is unremarkable.  There are no osseous
lesions.  Pacemaker and Port-A-Cath leads are unremarkable.  There
is atherosclerotic calcification within the thoracic aorta.
IMPRESSION: There are patchy areas of alveolar infiltrate bilaterally, as
described above. The right lower lobe has improved since the prior
study but the right middle lobe and medial right upper lobes have
worsened.  The right middle lobe is more consolidated.  Follow-up
CT scan in approximately 2 - 4 months is recommended to document
resolution.

## 2011-06-19 ENCOUNTER — Telehealth: Payer: Self-pay | Admitting: Internal Medicine

## 2011-06-19 ENCOUNTER — Encounter: Payer: Self-pay | Admitting: Internal Medicine

## 2011-06-19 ENCOUNTER — Other Ambulatory Visit: Payer: Self-pay | Admitting: Family Medicine

## 2011-06-19 DIAGNOSIS — G8929 Other chronic pain: Secondary | ICD-10-CM

## 2011-06-19 MED ORDER — GABAPENTIN 600 MG PO TABS: 600.0000 mg | ORAL_TABLET | Freq: Four times a day (QID) | ORAL | Status: DC

## 2011-06-19 NOTE — Telephone Encounter (Signed)
Dr. Juanda Chance, Patient is wanting to stop TPN but continue IV fluids. [facility] needs an order for this. Is this okay?

## 2011-06-19 NOTE — Telephone Encounter (Signed)
OK to continue IV fluids. I am not sure what she was on before the cTNA. She can have NaCl 1000cc bag IV 3x/week, prn dehydration, run over night or during the day over 6 hrs, her husband knows how to hook her up.

## 2011-06-19 NOTE — Telephone Encounter (Signed)
Refill for  Gabapentin 600mg  TAB #120 Take 1-tablet 4-times daily Qty 120 Last filled 2.26.13

## 2011-06-20 NOTE — Telephone Encounter (Signed)
Orders faxed as per Dr. Juanda Chance.

## 2011-06-22 ENCOUNTER — Other Ambulatory Visit: Payer: Self-pay | Admitting: Family Medicine

## 2011-06-22 MED ORDER — TEMAZEPAM 15 MG PO CAPS: ORAL_CAPSULE | ORAL | Status: DC

## 2011-06-22 NOTE — Telephone Encounter (Signed)
Refill x1 

## 2011-06-22 NOTE — Telephone Encounter (Signed)
Refill for Temazepam 15MG  CAP #60 Qty 90 Take 1-2 Capsules at bedtime as needed for sleep  Last filled 4.4.13

## 2011-06-22 NOTE — Telephone Encounter (Signed)
Last seen 03/25/11 and filled 04/20/11 # 60. Please advise     KP

## 2011-06-27 ENCOUNTER — Encounter: Payer: Self-pay | Admitting: Internal Medicine

## 2011-06-29 IMAGING — CR DG CHEST 2V
2 series · 2 of 2 positions shown · non-contrast
Comparison: 08/17/2010 CT and 08/16/2010 plain film.

CLINICAL DATA: Follow up pneumonia.  COPD.  Ex-smoker.

CHEST - 2 VIEW

[view not recorded (1 of 2)]
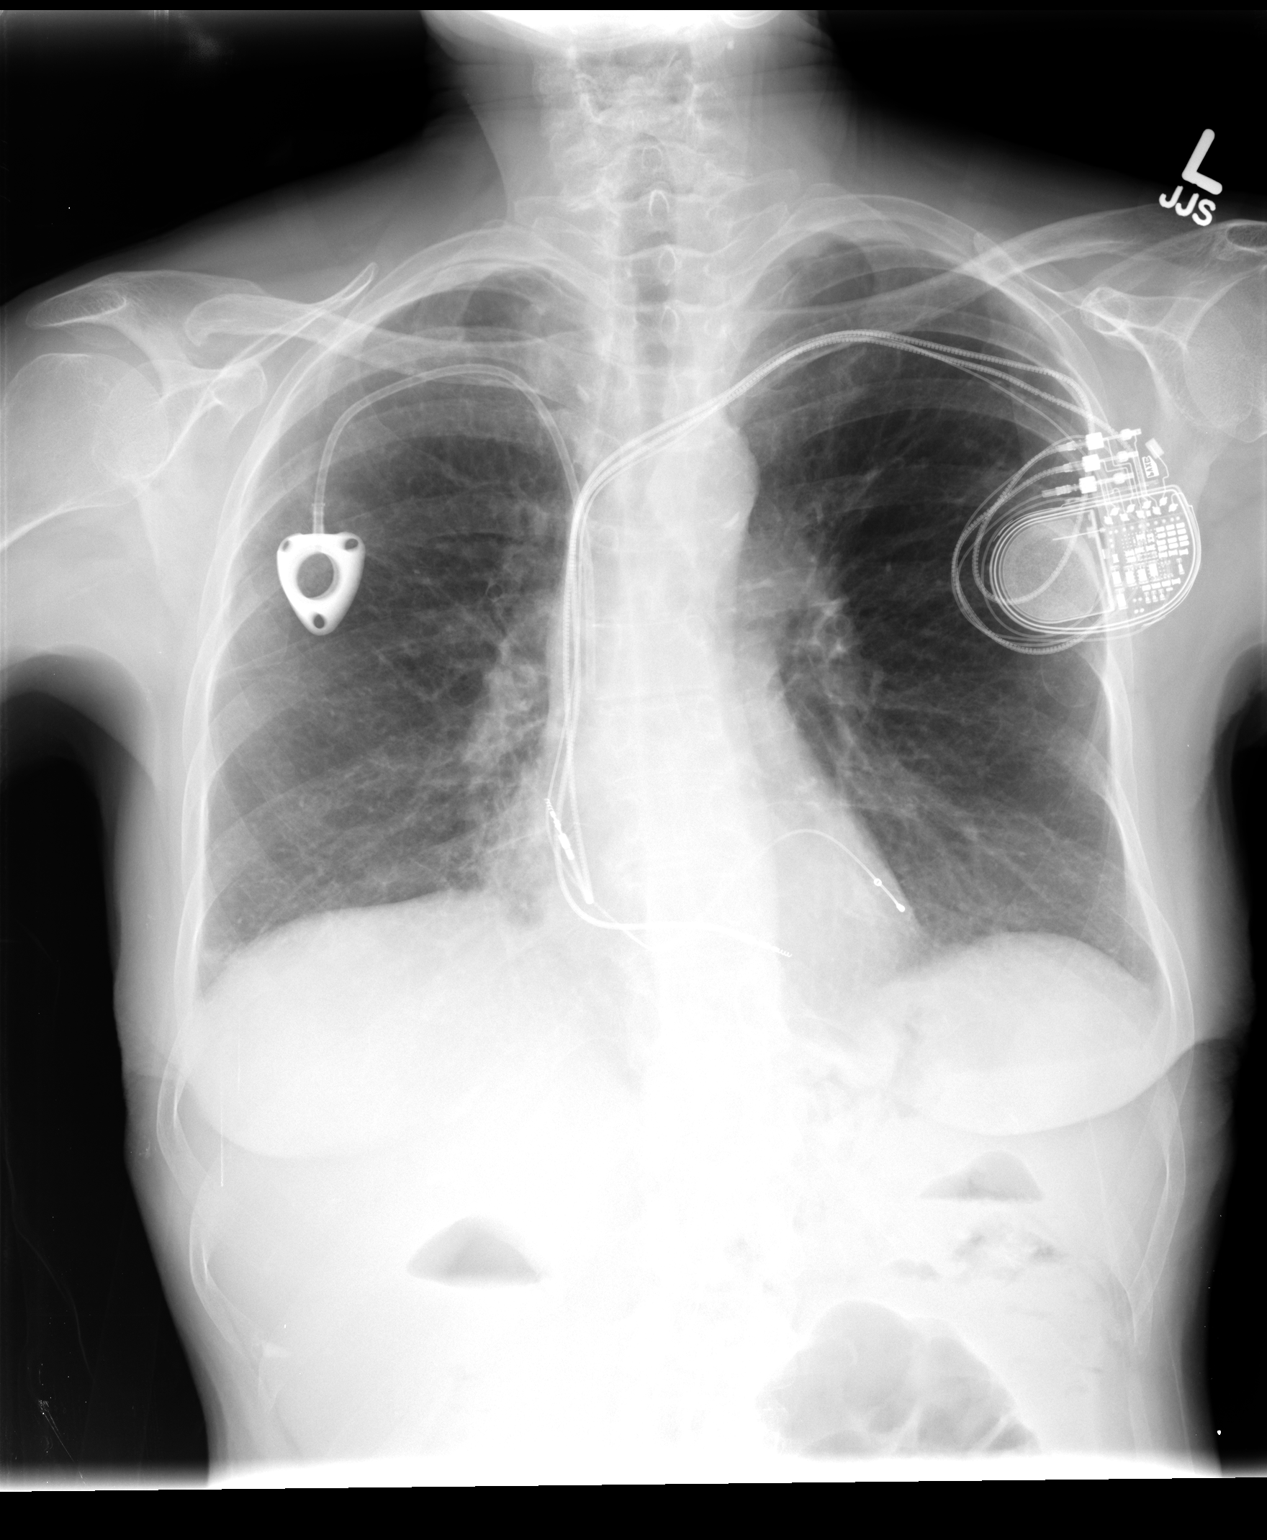

[view not recorded (2 of 2)]
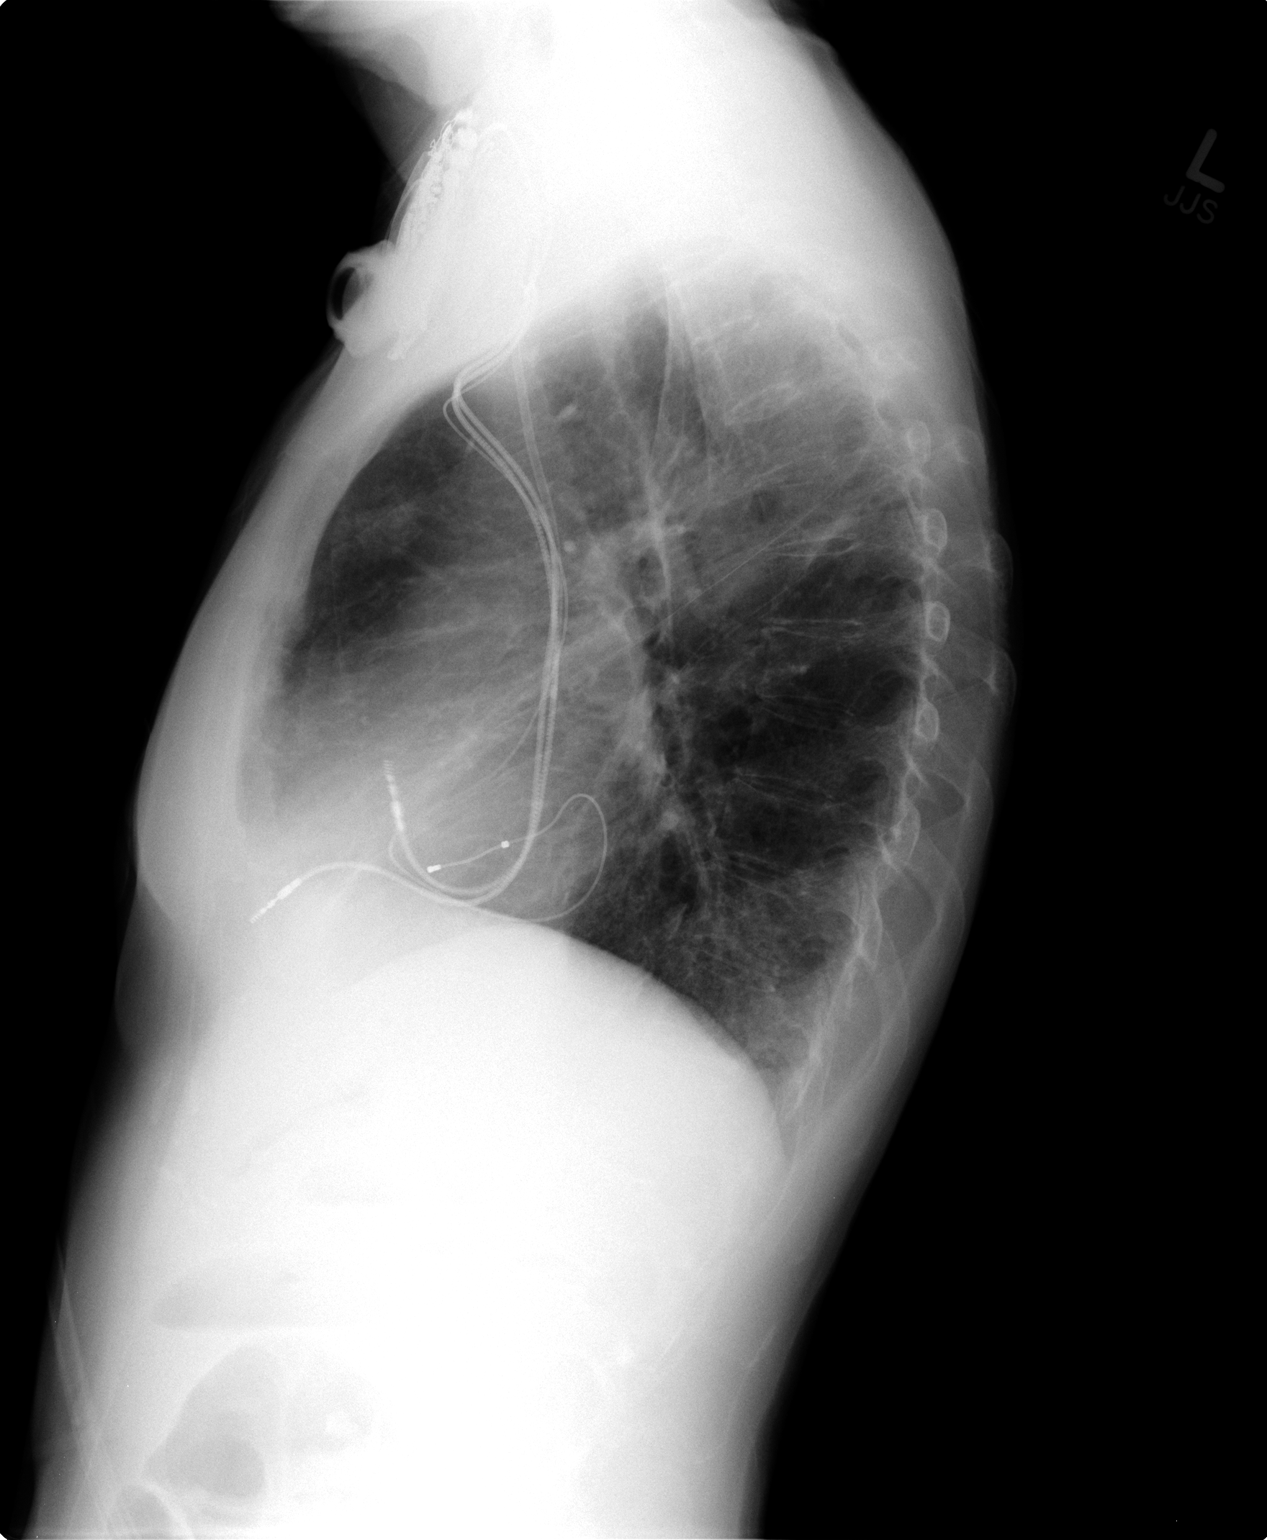

[2 of 2 positions shown; findings below may reference images not displayed]

FINDINGS: Mild osteopenia.  Right-sided Port-A-Cath which
terminates at the mid to low SVC.  Dual lead pacer / AICD device.
Right atrium, right ventricle, and coronary sinus. Midline trachea.
Normal heart size and mediastinal contours for age.  No pleural
effusion or pneumothorax.  Diffuse peribronchial thickening.
Improved right base aeration.  There is likely still residual lower
lobe airspace disease in the lateral view.  No acute superimposed
process.
IMPRESSION: Improved but not resolved right lower lobe airspace disease.

## 2011-07-02 ENCOUNTER — Other Ambulatory Visit: Payer: Self-pay | Admitting: Family Medicine

## 2011-07-02 ENCOUNTER — Ambulatory Visit (INDEPENDENT_AMBULATORY_CARE_PROVIDER_SITE_OTHER): Payer: Medicare Other | Admitting: Internal Medicine

## 2011-07-02 DIAGNOSIS — E538 Deficiency of other specified B group vitamins: Secondary | ICD-10-CM

## 2011-07-03 NOTE — Telephone Encounter (Signed)
Refill done.  

## 2011-07-04 ENCOUNTER — Telehealth: Payer: Self-pay | Admitting: Internal Medicine

## 2011-07-05 ENCOUNTER — Other Ambulatory Visit: Payer: Self-pay | Admitting: Family Medicine

## 2011-07-05 NOTE — Telephone Encounter (Signed)
Ok to refill 

## 2011-07-05 NOTE — Telephone Encounter (Signed)
We ought to stop drawing regular labs.

## 2011-07-05 NOTE — Telephone Encounter (Signed)
Home  Health calling to see when patient needs labs drawn now that the TPA has been stopped. Should she have weekly or monthly labs. Please, advise.

## 2011-07-05 NOTE — Telephone Encounter (Signed)
OK X1 

## 2011-07-06 NOTE — Telephone Encounter (Signed)
Spoke with Eston Esters and gave her Dr. Regino Schultze recommendation

## 2011-07-10 ENCOUNTER — Encounter: Payer: Self-pay | Admitting: Internal Medicine

## 2011-07-10 ENCOUNTER — Ambulatory Visit: Payer: Medicare Other

## 2011-07-10 ENCOUNTER — Ambulatory Visit (INDEPENDENT_AMBULATORY_CARE_PROVIDER_SITE_OTHER): Payer: Medicare Other | Admitting: Internal Medicine

## 2011-07-10 ENCOUNTER — Telehealth: Payer: Self-pay | Admitting: Pulmonary Disease

## 2011-07-10 ENCOUNTER — Ambulatory Visit (INDEPENDENT_AMBULATORY_CARE_PROVIDER_SITE_OTHER): Payer: Medicare Other | Admitting: Neurology

## 2011-07-10 ENCOUNTER — Encounter: Payer: Self-pay | Admitting: Neurology

## 2011-07-10 ENCOUNTER — Telehealth: Payer: Self-pay | Admitting: *Deleted

## 2011-07-10 VITALS — BP 108/62 | HR 92 | Wt 100.0 lb

## 2011-07-10 VITALS — BP 100/60 | HR 88 | Wt 100.8 lb

## 2011-07-10 DIAGNOSIS — K3184 Gastroparesis: Secondary | ICD-10-CM

## 2011-07-10 DIAGNOSIS — K56609 Unspecified intestinal obstruction, unspecified as to partial versus complete obstruction: Secondary | ICD-10-CM

## 2011-07-10 DIAGNOSIS — D509 Iron deficiency anemia, unspecified: Secondary | ICD-10-CM

## 2011-07-10 DIAGNOSIS — N1330 Unspecified hydronephrosis: Secondary | ICD-10-CM

## 2011-07-10 DIAGNOSIS — R569 Unspecified convulsions: Secondary | ICD-10-CM

## 2011-07-10 NOTE — Progress Notes (Signed)
[name] Sep 26, 1945 MRN [id number]  History of Present Illness:  This is a 66 year old white female with chronic small bowel obstruction due to adhesive disease and an inflammatory stricture found at the distal small bowel above the ileocolic anastomosis. The inflammatory stricture was located between 50-60 cm on her colonoscopy in October 2012. She was tried on a prednisone taper without response. She had a subtotal colectomy in 2001 and exploratory laparotomy in February 2011 with unsuccessful lysis of adhesions and placement of a percutaneous gastrostomy. She subsequently developed a gastrocutaneous fistula. The patient was put on bowel rest and TNA. TNA was discontinued about 2 weeks ago and she has been able to maintain her weight of about 100 pounds. A CT scan of the abdomen in May 2012 showed right-sided hydronephrosis which has been stable since 2010. She has done reasonably well since her last appointment 2 months ago. She is going to have a right hip replacement by Dr. Magnus Ivan.   Past Medical History  Diagnosis Date  . Small bowel obstruction   . HTN (hypertension)   . Systolic CHF, chronic   . GERD (gastroesophageal reflux disease)   . CAD (coronary artery disease)     mild disease per cath in 2011  . LBBB (left bundle branch block)   . Atrial fibrillation     on amiodarone  . Venous embolism and thrombosis of subclavian vein     after pacemaker insertion Oct 2011  . Diverticulosis   . COPD (chronic obstructive pulmonary disease)     -06/28/08 PFT FEV1 1.95 (97%), FVC 2.79 (101%), FEV1% 70, TLC 3.88 (87%), DLCO 64%  . Pleural effusion      s/p right thoracentesis 03/09  . Small bowel obstruction   . DDD (degenerative disc disease)   . Urinary incontinence     has pacemaker stimulator in her bladder  . RLS (restless legs syndrome)   . Primary dilated cardiomyopathy     EF 45 to 50% per echo in Jan 2012  . Vitamin B12 deficiency   . Gastroparesis   . Chronic nausea    . Chronic abdominal pain   . Chronic pain syndrome   . Gastrocutaneous fistula   . Pacemaker     CRT therapy; followed by Dr. Graciela Husbands  . H/O: GI bleed     from Pradaxa  . Oxygen dependent     2 liters via [name] all the time  . CHF (congestive heart failure)   . Pneumonia - 03/09, 12/09, 02/10, 06/10  . Anemia   . Fibromyalgia   . Hx of stress fx aug 2011    right hip   . Elbow fracture, left aug 2012   Past Surgical History  Procedure Date  . Appendectomy   . Bladder surgery   . Total abdominal hysterectomy   . Pacemaker placement     CRT-PPM-Medtronic in October 2011  . Peg placement 2012  . Abdominal adhesion surgery     numerous surgeries, last done 2011  . Peg removed     with complications  . Tonsillectomy age 89  . Colectomy     Intestinal resection (5times),S/P PARTIAL COLECTOMY 2001  . Cardiac catheterization 2008  . Interstim implant revision 03/06/2011    Procedure: REVISION OF Leane Platt;  Surgeon: Martina Sinner, MD;  Location: [facility];  Service: Urology;  Laterality: N/A;  Replacement of Neurostimulator    reports that she quit smoking about 25 years ago. Her smoking use included Cigarettes. She has a  20 pack-year smoking history. She has never used smokeless tobacco. She reports that she does not drink alcohol or use illicit drugs. family history includes Breast cancer in her maternal aunt; Diabetes in her paternal grandfather; Heart disease in her brother, father, mother, and paternal aunt; and Stroke in her mother.  There is no history of Colon cancer. Allergies  Allergen Reactions  . Pradaxa Other (See Comments)    LOW BLOOD COUNT  . Sulfonamide Derivatives Other (See Comments)    SHAKE   . Talwin Other (See Comments)    hallucination        Review of Systems: Denies dysphagia odynophagia chest pain. Weight stable  The remainder of the 10 point ROS is negative except as outlined in H&P   Physical Exam: General appearance  Well developed, in no  distress. Chronically ill-appearing date the oxygen can call for the shoulder Eyes- non icteric. HEENT nontraumatic, normocephalic. Mouth no lesions, tongue papillated, no cheilosis. Neck supple without adenopathy, thyroid not enlarged, no carotid bruits, no JVD. Lungs Clear to auscultation bilaterally. Cor normal S1, normal S2, regular rhythm, no murmur,  quiet precordium. Abdomen: Increased tympany. Firm nontender. Normal active bowel sounds. Rectal: Soft Hemoccult-positive stool Extremities no pedal edema. Skin no lesions. Neurological alert and oriented x 3. Psychological normal mood and affect.  Assessment and Plan:  Problem #1 Chronic low-grade small bowel obstruction due to adhesive disease. She has done quite well since TPN has been discontinued maintaining her weight. She is tolerating a low-residue diet with small frequent feedings. Her pre-albumin is still very low but she is managing.  Problem #2 Port-A-Cath. This need to be flushed by [facility] once per month.  Problem #3 Iron deficiency. Patient has occult positive stool and ongoing GI blood loss from the small bowel. We will set her up for an iron infusion. His last infusion was in October 2012 and her last hemoglobin was 10.4.  Problem #4 Hydronephrosis as per CT scan in May 2012. We will obtain a followup ultrasound of the right kidney. We will also obtain a urinalysis today.     07/10/2011 [name]

## 2011-07-10 NOTE — Patient Instructions (Signed)
You have been scheduled for an iron infusion at [facility] on 07/18/11 @ 8:30 am. Please bring a book or something to do as you will be at the facility for several hours. You have been scheduled for an abdominal ultrasound at [facility]iology (1st floor of hospital) on 07/12/11 Thursday at 9:00 am. Please arrive 15 minutes prior to your appointment for registration. Make certain not to have anything to eat or drink 6 hours prior to your appointment. Should you need to reschedule your appointment, please contact radiology at [phone]. Your physician has requested that you go to the basement for the following lab work before leaving today: Urinalysis We will contact [facility] to advise them to flush your port-a-cath and draw your CBC and Renal profile once per month. CC: Dr Loreen Freud

## 2011-07-10 NOTE — Telephone Encounter (Signed)
I have spoken to [name] at [facility]. He has taken orders for port-a-cath flush and CBC and Renal Profile once per month.

## 2011-07-10 NOTE — Telephone Encounter (Signed)
I have placed letter in [facility] look at. One is from patient and one is from Dr. Magnus Ivan. Please advise Dr. Craige Cotta, thanks

## 2011-07-11 LAB — URINALYSIS, ROUTINE W REFLEX MICROSCOPIC
Ketones, ur: NEGATIVE
Nitrite: NEGATIVE
Specific Gravity, Urine: 1.02 (ref 1.000–1.030)
Total Protein, Urine: NEGATIVE
Urine Glucose: NEGATIVE
Urobilinogen, UA: 0.2 (ref 0.0–1.0)
pH: 5.5 (ref 5.0–8.0)

## 2011-07-11 NOTE — Progress Notes (Signed)
Dear Dr. Laury Axon,   Thank you for having me see [name] back in [facility] Neurology for her problem with falls, spells of confusion and twitching. As you may recall, she is a 66 y.o. year old female with a history of chronic pain, TPN dependence, CHF who presents with 1-2 year history of spells of confusion lasting hours up to a day that are sometimes accompanied by falls. The falls also occur without the spells of confusion. The confusion usually involves day night reversal, she will be cooking dinner at midnight for example. She also appears to slur her words and have problems finding words during these times. Her falls are not described as a loss of consciousness but rather "tripping" or her "legs giving away." She does not generally remember the loss of consciousness spells.   She did have a confusion spell about 3 years ago where she was found to be hypoglycemic. No one has checked her blood pressure or glucose during these more recent spells. She denies new medication. She denies lightheadedness.   She has been on TPN for about 6 months for chronic SBO. The spells occurred before the TPN use.  She denies liver or kidney problems.  She is on chronic opiods for multiple sites of pain. These have not changed recently.  She also complains of twitching at night and during the day. This is multifocal and involves the head as well.   She uses O2 for her COPD. Patient also complains of difficulty with memory.   When I last saw her I felt that her falls and spells of confusion were likely due to toxic metabolic causes.  I felt that her twitching was likely due to her use of opiods.  Since I last saw her she has not had any further falls or spells of confusion.  Interestingly she has stopped her TPN as well.   Medical history, social history, and family history were reviewed and have not changed except where noted above since the last clinic visit.  Current Outpatient Prescriptions on File  Prior to Visit  Medication Sig Dispense Refill  . albuterol (PROAIR HFA) 108 (90 BASE) MCG/ACT inhaler Inhale 2 puffs into the lungs every 6 (six) hours as needed for wheezing.  1 Inhaler  5  . amiodarone (PACERONE) 200 MG tablet Take 1 tablet (200 mg total) by mouth every morning.  30 tablet  6  . carvedilol (COREG) 6.25 MG tablet Take 1 tablet (6.25 mg total) by mouth 2 (two) times daily with a meal.  60 tablet  4  . cyclobenzaprine (FLEXERIL) 10 MG tablet TAKE 1 TABLET 3 TIMES DAILY AS NEEDED  60 tablet  1  . cyclobenzaprine (FLEXERIL) 10 MG tablet TAKE 1 TABLET BY MOUTH THREE TIMES DAILY AS NEEDED FOR MUSCLE SPASMS  60 tablet  0  . denosumab (PROLIA) 60 MG/ML SOLN Inject 60 mg into the skin every 6 (six) months. Twice a year      . etodolac (LODINE) 400 MG tablet Take 400 mg by mouth 2 (two) times daily.       . fentaNYL (DURAGESIC - DOSED MCG/HR) 50 MCG/HR Place 1 patch (50 mcg total) onto the skin every 3 (three) days.  10 patch  0  . furosemide (LASIX) 20 MG tablet Take 1 tablet (20 mg total) by mouth every morning.  30 tablet  6  . gabapentin (NEURONTIN) 600 MG tablet Take 1 tablet (600 mg total) by mouth 4 (four) times daily.  120 tablet  2  . nitroGLYCERIN (NITROSTAT) 0.4 MG SL tablet Place 1 tablet (0.4 mg total) under the tongue every 5 (five) minutes as needed for chest pain. CHEST PAIN   25 tablet  11  . oxyCODONE-acetaminophen (PERCOCET) 10-325 MG per tablet Take 1 tablet by mouth every 4 (four) hours as needed. PAIN  120 tablet  0  . pantoprazole (PROTONIX) 40 MG tablet TAKE 1 TABLET EACH DAY  90 tablet  0  . promethazine (PHENERGAN) 25 MG tablet Take 1 tablet (25 mg total) by mouth every 8 (eight) hours as needed for nausea.  40 tablet  0  . temazepam (RESTORIL) 15 MG capsule TAKE ONE TO TWO CAPSULES BY MOUTH AT BEDTIME AS NEEDED FOR SLEEP  60 capsule  0  . tiotropium (SPIRIVA HANDIHALER) 18 MCG inhalation capsule Place 18 mcg into inhaler and inhale every morning.       . TPN  ADULT Inject into the vein every 12 (twelve) hours. tpn 700 pm to 0700 am daily via right chest port a cath      . DISCONTD: iron dextran complex (INFED) 50 MG/ML injection Please give infed infusion (no test dose needed) over 4 hours per pharmacy calculated dose. Ht: 5'2 Wt: 99 pounds Hgb: 12  1 mL  0   Current Facility-Administered Medications on File Prior to Visit  Medication Dose Route Frequency Provider Last Rate Last Dose  . cyanocobalamin ((VITAMIN B-12)) injection 1,000 mcg  1,000 mcg Intramuscular Q30 days Hart Carwin, MD   1,000 mcg at 07/02/11 1414    Allergies  Allergen Reactions  . Pradaxa Other (See Comments)    LOW BLOOD COUNT  . Sulfonamide Derivatives Other (See Comments)    SHAKE   . Talwin Other (See Comments)    hallucination    ROS:  13 systems and are unremarkable.  Exam: . Filed Vitals:   07/10/11 1416  BP: 108/62  Pulse: 92  Weight: 100 lb (45.36 kg)    In general, well appearing women.  Mental status:   The patient is oriented to person, place and time. Recent and remote memory are intact. Attention span and concentration are normal. Language including repetition, naming, following commands are intact. Fund of knowledge of current and historical events, as well as vocabulary are normal.  Cranial Nerves: Pupils are equally round and reactive to light. Extraocular movements are intact without nystagmus. Facial sensation and muscles of mastication are intact. Muscles of facial expression are symmetric.Tongue protrusion, uvula, palate midline.  Shoulder shrug intact  Motor:  Normal bulk and tone, no drift and 5/5 muscle strength bilaterally.  No asterixis.  Reflexes:  Symmetric  Coordination:  Normal finger to nose  Gait:  Normal gait and station.    Impression/Recommendations:  1.  Spells of confusion - likely toxic/metabolic, perhaps related to TPN.  If she has another then she will check her glucose as well as BP. 2.  Falls - again  related to spells of confusion. 3.  Twitching - likely related to use of opiods.  We will see the patient back in 6 months.  Lupita Raider Modesto Charon, MD [facility], [place]

## 2011-07-12 ENCOUNTER — Ambulatory Visit (HOSPITAL_COMMUNITY)
Admission: RE | Admit: 2011-07-12 | Discharge: 2011-07-12 | Disposition: A | Discharge status: Home / Self Care | Payer: Medicare Other | Source: Ambulatory Visit | Attending: Internal Medicine | Admitting: Internal Medicine

## 2011-07-12 ENCOUNTER — Telehealth: Payer: Self-pay | Admitting: *Deleted

## 2011-07-12 DIAGNOSIS — N390 Urinary tract infection, site not specified: Secondary | ICD-10-CM

## 2011-07-12 DIAGNOSIS — N1330 Unspecified hydronephrosis: Secondary | ICD-10-CM | POA: Insufficient documentation

## 2011-07-12 DIAGNOSIS — K838 Other specified diseases of biliary tract: Secondary | ICD-10-CM | POA: Insufficient documentation

## 2011-07-12 MED ORDER — AMOXICILLIN 250 MG PO CAPS: ORAL_CAPSULE | ORAL | Status: DC

## 2011-07-12 NOTE — Telephone Encounter (Signed)
Spoke with patient and gave her the change of antibiotics and why we changed them

## 2011-07-12 NOTE — Telephone Encounter (Signed)
Rx ordered. Lab in [facility] for repeat UA. Left a message for patient to call me.

## 2011-07-12 NOTE — Telephone Encounter (Signed)
Cipro and Amiodarone may cause arrithymias. Per Dr. Juanda Chance Amoxycillin 250 mg TID x 7 days. Rx sent. Left a message for patient to call me to explain changes in medication.

## 2011-07-12 NOTE — Telephone Encounter (Signed)
Message copied by Daphine Deutscher on Thu Jul 12, 2011  9:04 AM ------      Message from: Hart Carwin      Created: Wed Jul 11, 2011 11:16 PM       Please call pt with abnormal U/A, please send her Cipro 250 mg po bid x 7 days,,#14, repeat u/a  after that.

## 2011-07-13 ENCOUNTER — Other Ambulatory Visit: Payer: Self-pay | Admitting: Family Medicine

## 2011-07-13 DIAGNOSIS — R52 Pain, unspecified: Secondary | ICD-10-CM

## 2011-07-13 MED ORDER — OXYCODONE-ACETAMINOPHEN 10-325 MG PO TABS: 1.0000 | ORAL_TABLET | ORAL | Status: DC | PRN

## 2011-07-13 MED ORDER — FENTANYL 50 MCG/HR TD PT72: 1.0000 | MEDICATED_PATCH | TRANSDERMAL | Status: DC

## 2011-07-13 NOTE — Telephone Encounter (Signed)
Last seen 04/04/11 and fentanyl filled 05/01/11 and Percocet filled 06/08/11 #120.please advise    KP

## 2011-07-13 NOTE — Telephone Encounter (Signed)
Ok for Fentanyl #10 Ok for [employer] #60, no refills

## 2011-07-13 NOTE — Telephone Encounter (Signed)
VM left advising Rx ready for pick up.     KP 

## 2011-07-13 NOTE — Telephone Encounter (Signed)
Patient called & would like the following printed & call when ready at [phone]  fentaNYL (DURAGESIC - DOSED MCG/HR) 50 MCG/HR [[id number]]  Order Details     Dose: 1 patch  Route: Transdermal  Frequency: every 72 hours    Dispense Quantity: 10 patch  Refills: 0  Fills Remaining: 0             Sig: Place 1 patch (50 mcg total) onto the skin every 3 (three) days.   &  oxyCODONE-acetaminophen (PERCOCET) 10-325 MG per tablet [[id number]]  Order Details     Dose: 1 tablet  Route: Oral  Frequency: Every 4 hours PRN    Dispense Quantity: 120 tablet  Refills: 0  Fills Remaining: 0             Sig: Take 1 tablet by mouth every 4 (four) hours as needed. PAIN          Last OV 1.16.13

## 2011-07-17 NOTE — Telephone Encounter (Signed)
Pt called back re: status of this letter. Needs back soon so she can have surgery. Hazel Sams

## 2011-07-18 ENCOUNTER — Encounter: Payer: Self-pay | Admitting: Pulmonary Disease

## 2011-07-18 ENCOUNTER — Telehealth: Payer: Self-pay | Admitting: Internal Medicine

## 2011-07-18 ENCOUNTER — Encounter: Payer: Self-pay | Admitting: Internal Medicine

## 2011-07-18 ENCOUNTER — Other Ambulatory Visit: Payer: Self-pay

## 2011-07-18 ENCOUNTER — Encounter (HOSPITAL_COMMUNITY): Admission: RE | Admit: 2011-07-18 | Payer: Medicare Other | Source: Ambulatory Visit

## 2011-07-18 MED ORDER — OXYCODONE-ACETAMINOPHEN 10-325 MG PO TABS: 1.0000 | ORAL_TABLET | ORAL | Status: DC | PRN

## 2011-07-18 NOTE — Telephone Encounter (Signed)
Patient did not take the Rx for 60, she gave it back to me and wanted to wait until Dr.Lowne came back to fill the 120.    KP

## 2011-07-18 NOTE — Telephone Encounter (Signed)
Spoke with patient and advised her that I have rescheduled her iron infusion for 07-24-11 @ 9:30 am. Patient verbalizes understanding.

## 2011-07-18 NOTE — Telephone Encounter (Signed)
I have completed note, and placed in my look at section on side A.

## 2011-07-18 NOTE — Telephone Encounter (Signed)
I have faxed note over to Dr. Eliberto Ivory office. Lmomtcb x1 for pt to make aware

## 2011-07-19 ENCOUNTER — Other Ambulatory Visit (HOSPITAL_COMMUNITY): Payer: Self-pay | Admitting: Orthopaedic Surgery

## 2011-07-19 NOTE — Telephone Encounter (Signed)
Pt returned call and is aware that note was signed and faxed to dr. Magnus Ivan. Nothing further needed at this time per pt. Hazel Sams

## 2011-07-20 ENCOUNTER — Other Ambulatory Visit: Payer: Self-pay | Admitting: Family Medicine

## 2011-07-20 ENCOUNTER — Other Ambulatory Visit: Payer: Self-pay | Admitting: Internal Medicine

## 2011-07-20 NOTE — Telephone Encounter (Signed)
Last seen 04/04/11 and filled 07/02/11 #60. Please advise     KP

## 2011-07-23 ENCOUNTER — Telehealth: Payer: Self-pay | Admitting: *Deleted

## 2011-07-23 ENCOUNTER — Telehealth (INDEPENDENT_AMBULATORY_CARE_PROVIDER_SITE_OTHER): Payer: Self-pay

## 2011-07-23 ENCOUNTER — Telehealth (INDEPENDENT_AMBULATORY_CARE_PROVIDER_SITE_OTHER): Payer: Self-pay | Admitting: General Surgery

## 2011-07-23 NOTE — Telephone Encounter (Signed)
Ok she needs to reschedule her Iron infusion when [employer] cath functioning again

## 2011-07-23 NOTE — Telephone Encounter (Signed)
Patient calling today stating the skin over her Port- a- cath is completely gone. Just happened over the last two days. She is supposed to have an iron infusion next week and she is worried about this area. Please advise.

## 2011-07-23 NOTE — Telephone Encounter (Signed)
Patient calling to let us know her port is showing through her skin. She has a call in for Dr. Doreen Salvage office to look at it. She wants to cancel her IV iron infusion for tomorrow. Cancelled appointment with [facility].

## 2011-07-23 NOTE — Telephone Encounter (Signed)
Spoke with patient and she will come by tomorrow and get UA done. Her father has been sick and she did not get by yet.

## 2011-07-23 NOTE — Telephone Encounter (Signed)
Returned pt's call about the PAC. Dr Dwain Sarna wanted to know about the [facility] showing thru the skin and the pt said the area looks black. I told the pt that as long as the port which is purple not showing that there is nothing that we could do right now. I did advise pt to call her orthopedic doctor that is doing the hip placement next week to let them know about the [facility] b/c that doctor might want to see the PAC. The pt will call her doctor and call me back.

## 2011-07-24 ENCOUNTER — Other Ambulatory Visit (INDEPENDENT_AMBULATORY_CARE_PROVIDER_SITE_OTHER): Payer: Medicare Other

## 2011-07-24 ENCOUNTER — Encounter (HOSPITAL_COMMUNITY): Admission: RE | Admit: 2011-07-24 | Payer: Medicare Other | Source: Ambulatory Visit

## 2011-07-24 ENCOUNTER — Telehealth: Payer: Self-pay | Admitting: Pulmonary Disease

## 2011-07-24 ENCOUNTER — Ambulatory Visit: Payer: Medicare Other | Admitting: Pulmonary Disease

## 2011-07-24 DIAGNOSIS — N390 Urinary tract infection, site not specified: Secondary | ICD-10-CM

## 2011-07-24 LAB — URINALYSIS
Hgb urine dipstick: NEGATIVE
Ketones, ur: NEGATIVE
Leukocytes, UA: NEGATIVE
Nitrite: NEGATIVE
Specific Gravity, Urine: 1.025 (ref 1.000–1.030)
Total Protein, Urine: NEGATIVE
Urine Glucose: NEGATIVE
Urobilinogen, UA: 0.2 (ref 0.0–1.0)
pH: 6 (ref 5.0–8.0)

## 2011-07-24 MED ORDER — TIOTROPIUM BROMIDE MONOHYDRATE 18 MCG IN CAPS: 18.0000 ug | ORAL_CAPSULE | RESPIRATORY_TRACT | Status: DC

## 2011-07-24 NOTE — Telephone Encounter (Signed)
I spoke with pt and she is needing a refill of spiriva sent to [facility]. rx has been sent. Also pt requesting an apt to be seen for her cough and wanting her lungs listened to. Since VS had an opening at 1:45 pt is coming in at that time to be seen

## 2011-07-25 ENCOUNTER — Ambulatory Visit (INDEPENDENT_AMBULATORY_CARE_PROVIDER_SITE_OTHER): Payer: Medicare Other | Admitting: General Surgery

## 2011-07-25 ENCOUNTER — Encounter (HOSPITAL_COMMUNITY): Payer: Self-pay | Admitting: Pharmacy Technician

## 2011-07-25 ENCOUNTER — Encounter (INDEPENDENT_AMBULATORY_CARE_PROVIDER_SITE_OTHER): Payer: Self-pay | Admitting: General Surgery

## 2011-07-25 VITALS — BP 86/58 | HR 58 | Temp 97.4°F | Resp 14 | Ht 62.0 in | Wt 105.4 lb

## 2011-07-25 DIAGNOSIS — Z9889 Other specified postprocedural states: Secondary | ICD-10-CM

## 2011-07-25 DIAGNOSIS — Z95828 Presence of other vascular implants and grafts: Secondary | ICD-10-CM

## 2011-07-25 NOTE — Progress Notes (Signed)
Patient ID: [name], female   DOB: 05-01-1945, 66 y.o.   MRN: [id number]  Chief Complaint  Patient presents with  . Follow-up    Eval PAC    HPI [name] is a 66 y.o. female.   HPI This is a 65yof who I know well from prior laparotomy. She is scheduled for hip replacement next week.  She has noticed that the port she has used for her tpn until recently and for fluids appears to have eroded through her skin.  She otherwise is well.  Her gastric fistula has finally closed.  Past Medical History  Diagnosis Date  . Small bowel obstruction   . HTN (hypertension)   . Systolic CHF, chronic   . GERD (gastroesophageal reflux disease)   . CAD (coronary artery disease)     mild disease per cath in 2011  . LBBB (left bundle branch block)   . Atrial fibrillation     on amiodarone  . Venous embolism and thrombosis of subclavian vein     after pacemaker insertion Oct 2011  . Diverticulosis   . COPD (chronic obstructive pulmonary disease)     -06/28/08 PFT FEV1 1.95 (97%), FVC 2.79 (101%), FEV1% 70, TLC 3.88 (87%), DLCO 64%  . Pleural effusion      s/p right thoracentesis 03/09  . Small bowel obstruction   . DDD (degenerative disc disease)   . Urinary incontinence     has pacemaker stimulator in her bladder  . RLS (restless legs syndrome)   . Primary dilated cardiomyopathy     EF 45 to 50% per echo in Jan 2012  . Vitamin B12 deficiency   . Gastroparesis   . Chronic nausea   . Chronic abdominal pain   . Chronic pain syndrome   . Gastrocutaneous fistula   . Pacemaker     CRT therapy; followed by Dr. Graciela Husbands  . H/O: GI bleed     from Pradaxa  . Oxygen dependent     2 liters via [place] all the time  . CHF (congestive heart failure)   . Pneumonia - 03/09, 12/09, 02/10, 06/10  . Anemia   . Fibromyalgia   . Hx of stress fx aug 2011    right hip   . Elbow fracture, left aug 2012    Past Surgical History  Procedure Date  . Appendectomy   . Bladder surgery   . Total  abdominal hysterectomy   . Pacemaker placement     CRT-PPM-[employer]tr[employer] in October 2011  . Peg placement 2012  . Abdominal adhesion surgery     numerous surgeries, last done 2011  . Peg removed     with complications  . Tonsillectomy age 72  . Colectomy     Intestinal resection (5times),S/P PARTIAL COLECTOMY 2001  . Cardiac catheterization 2008  . Interstim implant revision 03/06/2011    Procedure: REVISION OF Leane Platt;  Surgeon: Martina Sinner, MD;  Location: [facility];  Service: Urology;  Laterality: N/A;  Replacement of Neurostimulator  . Portacath placement     Family History  Problem Relation Age of Onset  . Heart disease Father   . Stroke Mother   . Heart disease Mother   . Colon cancer Neg Hx   . Heart disease Brother   . Breast cancer Maternal Aunt   . Heart disease Paternal Aunt   . Diabetes Paternal Grandfather     Social History History  Substance Use Topics  . Smoking status: Former Smoker --  1.0 packs/day for 20 years    Types: Cigarettes    Quit date: 03/19/1986  . Smokeless tobacco: Never Used  . Alcohol Use: No    Allergies  Allergen Reactions  . Dabigatran Etexilate Mesylate Other (See Comments)    LOW BLOOD COUNT  . Pentazocine Lactate Other (See Comments)    hallucination  . Sulfonamide Derivatives Other (See Comments)    SHAKE     Current Outpatient Prescriptions  Medication Sig Dispense Refill  . albuterol (PROAIR HFA) 108 (90 BASE) MCG/ACT inhaler Inhale 2 puffs into the lungs every 6 (six) hours as needed for wheezing.  1 Inhaler  5  . amiodarone (PACERONE) 200 MG tablet Take 1 tablet (200 mg total) by mouth every morning.  30 tablet  6  . carvedilol (COREG) 6.25 MG tablet Take 1 tablet (6.25 mg total) by mouth 2 (two) times daily with a meal.  60 tablet  4  . cyclobenzaprine (FLEXERIL) 10 MG tablet Take 10 mg by mouth 3 (three) times daily as needed. For pain      . denosumab (PROLIA) 60 MG/ML SOLN Inject 60 mg into the skin every 6 (six)  months. Twice a year      . etodolac (LODINE) 400 MG tablet Take 400 mg by mouth 2 (two) times daily.       . fentaNYL (DURAGESIC - DOSED MCG/HR) 50 MCG/HR Place 1 patch (50 mcg total) onto the skin every 3 (three) days.  10 patch  0  . furosemide (LASIX) 20 MG tablet Take 1 tablet (20 mg total) by mouth every morning.  30 tablet  6  . gabapentin (NEURONTIN) 600 MG tablet Take 1 tablet (600 mg total) by mouth 4 (four) times daily.  120 tablet  2  . nitroGLYCERIN (NITROSTAT) 0.4 MG SL tablet Place 1 tablet (0.4 mg total) under the tongue every 5 (five) minutes as needed for chest pain. CHEST PAIN   25 tablet  11  . oxyCODONE-acetaminophen (PERCOCET) 10-325 MG per tablet Take 1 tablet by mouth every 4 (four) hours as needed. PAIN  120 tablet  0  . pantoprazole (PROTONIX) 40 MG tablet Take 40 mg by mouth daily with breakfast.      . promethazine (PHENERGAN) 25 MG tablet Take 1 tablet (25 mg total) by mouth every 8 (eight) hours as needed for nausea.  40 tablet  0  . temazepam (RESTORIL) 15 MG capsule Take 15-30 mg by mouth at bedtime as needed. For sleep      . tiotropium (SPIRIVA HANDIHALER) 18 MCG inhalation capsule Place 1 capsule (18 mcg total) into inhaler and inhale every morning.  30 capsule  5  . TPN ADULT Inject into the vein every 12 (twelve) hours. tpn 700 pm to 0700 am daily via right chest port a cath      . DISCONTD: iron dextran complex (INFED) 50 MG/ML injection Please give infed infusion (no test dose needed) over 4 hours per pharmacy calculated dose. Ht: 5'2 Wt: 99 pounds Hgb: 12  1 mL  0   Current Facility-Administered Medications  Medication Dose Route Frequency Provider Last Rate Last Dose  . cyanocobalamin ((VITAMIN B-12)) injection 1,000 mcg  1,000 mcg Intramuscular Q30 days Hart Carwin, MD   1,000 mcg at 07/02/11 1414    Review of Systems Review of Systems  Blood pressure 86/58, pulse 58, temperature 97.4 F (36.3 C), temperature source Temporal, resp. rate 14,  height 5\' 2"  (1.575 m), weight 105 lb 6 oz (  47.798 kg).  Physical Exam Physical Exam  Vitals reviewed. Constitutional: She appears well-developed.  Neck: Neck supple.  Cardiovascular: Normal rate, regular rhythm and normal heart sounds.   Pulmonary/Chest: Effort normal and breath sounds normal.    Lymphadenopathy:    She has no cervical adenopathy.     Assessment    Exposed port     Plan    She is quite thin and her port has been used frequently.  Her skin has eroded through and her port is now visible.  I told her we need to remove this port this week.  I will not put in another one at that time.        Gennie Eisinger 07/25/2011, 8:56 PM

## 2011-07-26 ENCOUNTER — Encounter (HOSPITAL_BASED_OUTPATIENT_CLINIC_OR_DEPARTMENT_OTHER): Payer: Self-pay | Admitting: *Deleted

## 2011-07-26 NOTE — Pre-Procedure Instructions (Signed)
To come for CBC, BMET, UA

## 2011-07-27 ENCOUNTER — Encounter (HOSPITAL_BASED_OUTPATIENT_CLINIC_OR_DEPARTMENT_OTHER)
Admission: RE | Disposition: A | Discharge status: Home / Self Care | Payer: Self-pay | Source: Ambulatory Visit | Attending: General Surgery

## 2011-07-27 ENCOUNTER — Encounter (HOSPITAL_BASED_OUTPATIENT_CLINIC_OR_DEPARTMENT_OTHER): Payer: Self-pay | Admitting: Anesthesiology

## 2011-07-27 ENCOUNTER — Ambulatory Visit (HOSPITAL_BASED_OUTPATIENT_CLINIC_OR_DEPARTMENT_OTHER): Payer: Medicare Other | Admitting: Anesthesiology

## 2011-07-27 ENCOUNTER — Ambulatory Visit (HOSPITAL_BASED_OUTPATIENT_CLINIC_OR_DEPARTMENT_OTHER)
Admission: RE | Admit: 2011-07-27 | Discharge: 2011-07-27 | Disposition: A | Discharge status: Home / Self Care | Payer: Medicare Other | Source: Ambulatory Visit | Attending: General Surgery | Admitting: General Surgery

## 2011-07-27 DIAGNOSIS — I447 Left bundle-branch block, unspecified: Secondary | ICD-10-CM | POA: Insufficient documentation

## 2011-07-27 DIAGNOSIS — J449 Chronic obstructive pulmonary disease, unspecified: Secondary | ICD-10-CM | POA: Insufficient documentation

## 2011-07-27 DIAGNOSIS — J4489 Other specified chronic obstructive pulmonary disease: Secondary | ICD-10-CM | POA: Insufficient documentation

## 2011-07-27 DIAGNOSIS — Y849 Medical procedure, unspecified as the cause of abnormal reaction of the patient, or of later complication, without mention of misadventure at the time of the procedure: Secondary | ICD-10-CM | POA: Insufficient documentation

## 2011-07-27 DIAGNOSIS — Z452 Encounter for adjustment and management of vascular access device: Secondary | ICD-10-CM

## 2011-07-27 DIAGNOSIS — I5022 Chronic systolic (congestive) heart failure: Secondary | ICD-10-CM | POA: Insufficient documentation

## 2011-07-27 DIAGNOSIS — I4891 Unspecified atrial fibrillation: Secondary | ICD-10-CM | POA: Insufficient documentation

## 2011-07-27 DIAGNOSIS — K5730 Diverticulosis of large intestine without perforation or abscess without bleeding: Secondary | ICD-10-CM | POA: Insufficient documentation

## 2011-07-27 DIAGNOSIS — I10 Essential (primary) hypertension: Secondary | ICD-10-CM | POA: Insufficient documentation

## 2011-07-27 DIAGNOSIS — Z950 Presence of cardiac pacemaker: Secondary | ICD-10-CM | POA: Insufficient documentation

## 2011-07-27 DIAGNOSIS — T82598A Other mechanical complication of other cardiac and vascular devices and implants, initial encounter: Secondary | ICD-10-CM | POA: Insufficient documentation

## 2011-07-27 DIAGNOSIS — K219 Gastro-esophageal reflux disease without esophagitis: Secondary | ICD-10-CM | POA: Insufficient documentation

## 2011-07-27 DIAGNOSIS — I2510 Atherosclerotic heart disease of native coronary artery without angina pectoris: Secondary | ICD-10-CM | POA: Insufficient documentation

## 2011-07-27 DIAGNOSIS — IMO0001 Reserved for inherently not codable concepts without codable children: Secondary | ICD-10-CM | POA: Insufficient documentation

## 2011-07-27 HISTORY — DX: Personal history of peptic ulcer disease: Z87.11

## 2011-07-27 HISTORY — DX: Personal history of Methicillin resistant Staphylococcus aureus infection: Z86.14

## 2011-07-27 HISTORY — DX: Unspecified osteoarthritis, unspecified site: M19.90

## 2011-07-27 HISTORY — DX: Personal history of other diseases of the digestive system: Z87.19

## 2011-07-27 HISTORY — PX: PORT-A-CATH REMOVAL: SHX5289

## 2011-07-27 HISTORY — DX: Diverticulitis of intestine, part unspecified, without perforation or abscess without bleeding: K57.92

## 2011-07-27 LAB — POCT I-STAT, CHEM 8
BUN: 28 mg/dL — ABNORMAL HIGH (ref 6–23)
Calcium, Ion: 0.93 mmol/L — ABNORMAL LOW (ref 1.12–1.32)
Chloride: 107 meq/L (ref 96–112)
Creatinine, Ser: 0.9 mg/dL (ref 0.50–1.10)
Glucose, Bld: 72 mg/dL (ref 70–99)
HCT: 35 % — ABNORMAL LOW (ref 36.0–46.0)
Hemoglobin: 11.9 g/dL — ABNORMAL LOW (ref 12.0–15.0)
Potassium: 3.8 meq/L (ref 3.5–5.1)
Sodium: 144 meq/L (ref 135–145)
TCO2: 27 mmol/L (ref 0–100)

## 2011-07-27 SURGERY — REMOVAL PORT-A-CATH
Anesthesia: Monitor Anesthesia Care | Site: Chest | Wound class: Clean

## 2011-07-27 MED ORDER — MIDAZOLAM HCL 5 MG/5ML IJ SOLN
INTRAMUSCULAR | Status: DC | PRN
  Administered 2011-07-27: 2 mg via INTRAVENOUS

## 2011-07-27 MED ORDER — PROPOFOL 10 MG/ML IV BOLUS
INTRAVENOUS | Status: DC | PRN
Start: 2011-07-27 — End: 2011-07-27
  Administered 2011-07-27: 10 mg via INTRAVENOUS

## 2011-07-27 MED ORDER — FENTANYL CITRATE 0.05 MG/ML IJ SOLN
INTRAMUSCULAR | Status: DC | PRN
  Administered 2011-07-27: 100 ug via INTRAVENOUS

## 2011-07-27 MED ORDER — LACTATED RINGERS IV SOLN
INTRAVENOUS | Status: DC
  Administered 2011-07-27: 15:00:00 via INTRAVENOUS

## 2011-07-27 MED ORDER — MIDAZOLAM HCL 2 MG/2ML IJ SOLN: 1.0000 mg | INTRAMUSCULAR | Status: DC | PRN

## 2011-07-27 MED ORDER — FENTANYL CITRATE 0.05 MG/ML IJ SOLN: 25.0000 ug | INTRAMUSCULAR | Status: DC | PRN

## 2011-07-27 MED ORDER — CEFAZOLIN SODIUM 1-5 GM-% IV SOLN
1.0000 g | INTRAVENOUS | Status: AC
  Administered 2011-07-27: 1 g via INTRAVENOUS

## 2011-07-27 MED ORDER — LIDOCAINE-EPINEPHRINE (PF) 1 %-1:200000 IJ SOLN
INTRAMUSCULAR | Status: DC | PRN
  Administered 2011-07-27: 9 mL

## 2011-07-27 MED ORDER — LORAZEPAM 2 MG/ML IJ SOLN: 1.0000 mg | Freq: Once | INTRAMUSCULAR | Status: DC | PRN

## 2011-07-27 MED ORDER — FENTANYL CITRATE 0.05 MG/ML IJ SOLN: 50.0000 ug | INTRAMUSCULAR | Status: DC | PRN

## 2011-07-27 MED ORDER — BUPIVACAINE HCL (PF) 0.25 % IJ SOLN
INTRAMUSCULAR | Status: DC | PRN
  Administered 2011-07-27: 9 mL

## 2011-07-27 MED ORDER — OXYCODONE-ACETAMINOPHEN 10-325 MG PO TABS: 1.0000 | ORAL_TABLET | Freq: Four times a day (QID) | ORAL | Status: DC | PRN

## 2011-07-27 SURGICAL SUPPLY — 32 items
ADH SKN CLS APL DERMABOND .7 (GAUZE/BANDAGES/DRESSINGS) ×1
BLADE SURG 15 STRL LF DISP TIS (BLADE) ×1 IMPLANT
BLADE SURG 15 STRL SS (BLADE) ×2
CHLORAPREP W/TINT 26ML (MISCELLANEOUS) ×2 IMPLANT
CLOTH BEACON ORANGE TIMEOUT ST (SAFETY) ×2 IMPLANT
COVER MAYO STAND STRL (DRAPES) ×2 IMPLANT
COVER TABLE BACK 60X90 (DRAPES) ×2 IMPLANT
DECANTER SPIKE VIAL GLASS SM (MISCELLANEOUS) IMPLANT
DERMABOND ADVANCED (GAUZE/BANDAGES/DRESSINGS) ×1
DERMABOND ADVANCED .7 DNX12 (GAUZE/BANDAGES/DRESSINGS) ×1 IMPLANT
DRAPE PED LAPAROTOMY (DRAPES) ×2 IMPLANT
DRSG TEGADERM 4X4.75 (GAUZE/BANDAGES/DRESSINGS) ×2 IMPLANT
ELECT COATED BLADE 2.86 ST (ELECTRODE) ×2 IMPLANT
ELECT REM PT RETURN 9FT ADLT (ELECTROSURGICAL) ×2
ELECTRODE REM PT RTRN 9FT ADLT (ELECTROSURGICAL) ×1 IMPLANT
GLOVE BIO SURGEON STRL SZ7 (GLOVE) ×2 IMPLANT
GOWN PREVENTION PLUS XLARGE (GOWN DISPOSABLE) ×4 IMPLANT
NEEDLE HYPO 25X1 1.5 SAFETY (NEEDLE) ×2 IMPLANT
PACK BASIN DAY SURGERY FS (CUSTOM PROCEDURE TRAY) ×2 IMPLANT
PENCIL BUTTON HOLSTER BLD 10FT (ELECTRODE) ×2 IMPLANT
SLEEVE SCD COMPRESS KNEE MED (MISCELLANEOUS) ×2 IMPLANT
SPONGE GAUZE 2X2 8PLY STRL LF (GAUZE/BANDAGES/DRESSINGS) ×2 IMPLANT
SUT ETHILON 3 0 PS 1 (SUTURE) ×2 IMPLANT
SUT ETHILON 4 0 PS 2 18 (SUTURE) ×2 IMPLANT
SUT MON AB 4-0 PC3 18 (SUTURE) ×2 IMPLANT
SUT VIC AB 3-0 SH 27 (SUTURE)
SUT VIC AB 3-0 SH 27X BRD (SUTURE) IMPLANT
SYR CONTROL 10ML LL (SYRINGE) ×2 IMPLANT
TOWEL OR 17X24 6PK STRL BLUE (TOWEL DISPOSABLE) ×2 IMPLANT
TOWEL OR NON WOVEN STRL DISP B (DISPOSABLE) ×2 IMPLANT
VCP215 3-0 VICRYL RB-1 27" IMPLANT
WATER STERILE IRR 1000ML POUR (IV SOLUTION) IMPLANT

## 2011-07-27 NOTE — Anesthesia Preprocedure Evaluation (Addendum)
Anesthesia Evaluation  Patient identified by MRN, date of birth, ID band Patient awake    Reviewed: Allergy & Precautions, H&P , NPO status , Patient's Chart, lab work & pertinent test results  Airway Mallampati: I TM Distance: >3 FB Neck ROM: Full    Dental   Pulmonary COPD   Pulmonary exam normal       Cardiovascular hypertension, + CAD + dysrhythmias Atrial Fibrillation + pacemaker Rhythm:Irregular Rate:Normal     Neuro/Psych  Neuromuscular disease    GI/Hepatic GERD-  ,  Endo/Other    Renal/GU      Musculoskeletal  (+) Fibromyalgia -  Abdominal   Peds  Hematology   Anesthesia Other Findings   Reproductive/Obstetrics                           Anesthesia Physical Anesthesia Plan  ASA: III  Anesthesia Plan: MAC   Post-op Pain Management:    Induction: Intravenous  Airway Management Planned: Simple Face Mask  Additional Equipment:   Intra-op Plan:   Post-operative Plan: Extubation in OR  Informed Consent: I have reviewed the patients History and Physical, chart, labs and discussed the procedure including the risks, benefits and alternatives for the proposed anesthesia with the patient or authorized representative who has indicated his/her understanding and acceptance.     Plan Discussed with: CRNA and Surgeon  Anesthesia Plan Comments:        Anesthesia Quick Evaluation

## 2011-07-27 NOTE — Discharge Instructions (Signed)
Post Anesthesia Home Care Instructions  Activity: Get plenty of rest for the remainder of the day. A responsible adult should stay with you for 24 hours following the procedure.  For the next 24 hours, DO NOT: -Drive a car -Advertising copywriter -Drink alcoholic beverages -Take any medication unless instructed by your physician -Make any legal decisions or sign important papers.  Meals: Start with liquid foods such as gelatin or soup. Progress to regular foods as tolerated. Avoid greasy, spicy, heavy foods. If nausea and/or vomiting occur, drink only clear liquids until the nausea and/or vomiting subsides. Call your physician if vomiting continues.  Special Instructions/Symptoms: Your throat may feel dry or sore from the anesthesia or the breathing tube placed in your throat during surgery. If this causes discomfort, gargle with warm salt water. The discomfort should disappear within 24 hours.      [facility] Office Phone Number [phone]   POST OP INSTRUCTIONS  Always review your discharge instruction sheet given to you by the facility where your surgery was performed.  IF YOU HAVE DISABILITY OR FAMILY LEAVE FORMS, YOU MUST BRING THEM TO THE OFFICE FOR PROCESSING.  DO NOT GIVE THEM TO YOUR DOCTOR.  1. A prescription for pain medication may be given to you upon discharge.  Take your pain medication as prescribed, if needed.  If narcotic pain medicine is not needed, then you may take acetaminophen (Tylenol), naprosyn (Alleve) or ibuprofen (Advil) as needed. 2. Take your usually prescribed medications unless otherwise directed 3. If you need a refill on your pain medication, please contact your pharmacy.  They will contact our office to request authorization.  Prescriptions will not be filled after 5pm or on week-ends. 4. You should eat very light the first 24 hours after surgery, such as soup, crackers, pudding, etc.  Resume your normal diet the day after  surgery. 5. Most patients will experience some swelling and bruising in the area of the incision.  Ice packs will help.  Swelling and bruising can take several days to resolve.  6. It is common to experience some constipation if taking pain medication after surgery.  Increasing fluid intake and taking a stool softener will usually help or prevent this problem from occurring.  A mild laxative (Milk of Magnesia or Miralax) should be taken according to package directions if there are no bowel movements after 48 hours. 7. Unless discharge instructions indicate otherwise, you may remove your bandages 48 hours after surgery and you may shower at that time.  You may have steri-strips (small skin tapes) in place directly over the incision.  These strips should be left on the skin for 7-10 days and will come off on their own.  If your surgeon used skin glue on the incision, you may shower in 24 hours.  The glue will flake off over the next 2-3 weeks.  Any sutures or staples will be removed at the office during your follow-up visit. Place neosporin or triple antibiotic ointment over the black stitches daily once dressing is removed. 8. ACTIVITIES:  You may resume regular daily activities (gradually increasing) beginning the next day.  You may have sexual intercourse when it is comfortable. a. You may drive when you no longer are taking prescription pain medication, you can comfortably wear a seatbelt, and you can safely maneuver your car and apply brakes. b. RETURN TO WORK:  ______________________________________________________________________________________ 9. You should see your doctor in the office for a follow-up appointment approximately 2-3 weeks after your surgery.  Your doctor's nurse will typically make your follow-up appointment when she calls you with your pathology report.  Expect your pathology report 3-4 business days after your surgery.  You may call to check if you do not hear from [place] after three  days. 10. OTHER INSTRUCTIONS: _______________________________________________________________________________________________ _____________________________________________________________________________________________________________________________________ _____________________________________________________________________________________________________________________________________ _____________________________________________________________________________________________________________________________________  WHEN TO CALL DR WAKEFIELD: 1. Fever over 101.0 2. Nausea and/or vomiting. 3. Extreme swelling or bruising. 4. Continued bleeding from incision. 5. Increased pain, redness, or drainage from the incision.  The clinic staff is available to answer your questions during regular business hours.  Please don't hesitate to call and ask to speak to one of the nurses for clinical concerns.  If you have a medical emergency, go to the nearest emergency room or call 911.  A surgeon from [facility] is always on call at the hospital.  For further questions, please visit [email or web address]

## 2011-07-27 NOTE — Transfer of Care (Signed)
Immediate Anesthesia Transfer of Care Note  Patient: [name]  Procedure(s) Performed: Procedure(s) (LRB): REMOVAL PORT-A-CATH (N/A)  Patient Location: PACU  Anesthesia Type: MAC  Level of Consciousness: awake, alert  and oriented  Airway & Oxygen Therapy: Patient Spontanous Breathing and Patient connected to face mask oxygen  Post-op Assessment: Report given to PACU RN and Post -op Vital signs reviewed and stable  Post vital signs: Reviewed and stable  Complications: No apparent anesthesia complications

## 2011-07-27 NOTE — Progress Notes (Signed)
[employer] person here and checked pacer, pt had no episodes.

## 2011-07-27 NOTE — Anesthesia Postprocedure Evaluation (Signed)
  Anesthesia Post-op Note  Patient: [name]  Procedure(s) Performed: Procedure(s) (LRB): REMOVAL PORT-A-CATH (N/A)  Patient Location: PACU  Anesthesia Type: MAC  Level of Consciousness: awake  Airway and Oxygen Therapy: Patient Spontanous Breathing  Post-op Pain: mild  Post-op Assessment: Post-op Vital signs reviewed, Patient's Cardiovascular Status Stable, Respiratory Function Stable, Patent Airway, No signs of Nausea or vomiting, Adequate PO intake and Pain level controlled  Post-op Vital Signs: stable  Complications: No apparent anesthesia complications

## 2011-07-27 NOTE — Progress Notes (Signed)
Thanks for the note, but I think you wanted to send it to [place], the fat [employer]

## 2011-07-27 NOTE — H&P (View-Only) (Signed)
Patient ID: [name], female   DOB: 05/24/1945, 65 y.o.   MRN: [id number]  Chief Complaint  Patient presents with  . Follow-up    Eval PAC    HPI [name] is a 65 y.o. female.   HPI This is a 65yof who I know well from prior laparotomy. She is scheduled for hip replacement next week.  She has noticed that the port she has used for her tpn until recently and for fluids appears to have eroded through her skin.  She otherwise is well.  Her gastric fistula has finally closed.  Past Medical History  Diagnosis Date  . Small bowel obstruction   . HTN (hypertension)   . Systolic CHF, chronic   . GERD (gastroesophageal reflux disease)   . CAD (coronary artery disease)     mild disease per cath in 2011  . LBBB (left bundle branch block)   . Atrial fibrillation     on amiodarone  . Venous embolism and thrombosis of subclavian vein     after pacemaker insertion Oct 2011  . Diverticulosis   . COPD (chronic obstructive pulmonary disease)     -06/28/08 PFT FEV1 1.95 (97%), FVC 2.79 (101%), FEV1% 70, TLC 3.88 (87%), DLCO 64%  . Pleural effusion      s/p right thoracentesis 03/09  . Small bowel obstruction   . DDD (degenerative disc disease)   . Urinary incontinence     has pacemaker stimulator in her bladder  . RLS (restless legs syndrome)   . Primary dilated cardiomyopathy     EF 45 to 50% per echo in Jan 2012  . Vitamin B12 deficiency   . Gastroparesis   . Chronic nausea   . Chronic abdominal pain   . Chronic pain syndrome   . Gastrocutaneous fistula   . Pacemaker     CRT therapy; followed by Dr. Klein  . H/O: GI bleed     from Pradaxa  . Oxygen dependent     2 liters via [place] all the time  . CHF (congestive heart failure)   . Pneumonia - 03/09, 12/09, 02/10, 06/10  . Anemia   . Fibromyalgia   . Hx of stress fx aug 2011    right hip   . Elbow fracture, left aug 2012    Past Surgical History  Procedure Date  . Appendectomy   . Bladder surgery   . Total  abdominal hysterectomy   . Pacemaker placement     CRT-PPM-[employer]tr[employer] in October 2011  . Peg placement 2012  . Abdominal adhesion surgery     numerous surgeries, last done 2011  . Peg removed     with complications  . Tonsillectomy age 4  . Colectomy     Intestinal resection (5times),S/P PARTIAL COLECTOMY 2001  . Cardiac catheterization 2008  . Interstim implant revision 03/06/2011    Procedure: REVISION OF INTERSTIM;  Surgeon: Scott A MacDiarmid, MD;  Location: [facility];  Service: Urology;  Laterality: N/A;  Replacement of Neurostimulator  . Portacath placement     Family History  Problem Relation Age of Onset  . Heart disease Father   . Stroke Mother   . Heart disease Mother   . Colon cancer Neg Hx   . Heart disease Brother   . Breast cancer Maternal Aunt   . Heart disease Paternal Aunt   . Diabetes Paternal Grandfather     Social History History  Substance Use Topics  . Smoking status: Former Smoker --   1.0 packs/day for 20 years    Types: Cigarettes    Quit date: 03/19/1986  . Smokeless tobacco: Never Used  . Alcohol Use: No    Allergies  Allergen Reactions  . Dabigatran Etexilate Mesylate Other (See Comments)    LOW BLOOD COUNT  . Pentazocine Lactate Other (See Comments)    hallucination  . Sulfonamide Derivatives Other (See Comments)    SHAKE     Current Outpatient Prescriptions  Medication Sig Dispense Refill  . albuterol (PROAIR HFA) 108 (90 BASE) MCG/ACT inhaler Inhale 2 puffs into the lungs every 6 (six) hours as needed for wheezing.  1 Inhaler  5  . amiodarone (PACERONE) 200 MG tablet Take 1 tablet (200 mg total) by mouth every morning.  30 tablet  6  . carvedilol (COREG) 6.25 MG tablet Take 1 tablet (6.25 mg total) by mouth 2 (two) times daily with a meal.  60 tablet  4  . cyclobenzaprine (FLEXERIL) 10 MG tablet Take 10 mg by mouth 3 (three) times daily as needed. For pain      . denosumab (PROLIA) 60 MG/ML SOLN Inject 60 mg into the skin every 6 (six)  months. Twice a year      . etodolac (LODINE) 400 MG tablet Take 400 mg by mouth 2 (two) times daily.       . fentaNYL (DURAGESIC - DOSED MCG/HR) 50 MCG/HR Place 1 patch (50 mcg total) onto the skin every 3 (three) days.  10 patch  0  . furosemide (LASIX) 20 MG tablet Take 1 tablet (20 mg total) by mouth every morning.  30 tablet  6  . gabapentin (NEURONTIN) 600 MG tablet Take 1 tablet (600 mg total) by mouth 4 (four) times daily.  120 tablet  2  . nitroGLYCERIN (NITROSTAT) 0.4 MG SL tablet Place 1 tablet (0.4 mg total) under the tongue every 5 (five) minutes as needed for chest pain. CHEST PAIN   25 tablet  11  . oxyCODONE-acetaminophen (PERCOCET) 10-325 MG per tablet Take 1 tablet by mouth every 4 (four) hours as needed. PAIN  120 tablet  0  . pantoprazole (PROTONIX) 40 MG tablet Take 40 mg by mouth daily with breakfast.      . promethazine (PHENERGAN) 25 MG tablet Take 1 tablet (25 mg total) by mouth every 8 (eight) hours as needed for nausea.  40 tablet  0  . temazepam (RESTORIL) 15 MG capsule Take 15-30 mg by mouth at bedtime as needed. For sleep      . tiotropium (SPIRIVA HANDIHALER) 18 MCG inhalation capsule Place 1 capsule (18 mcg total) into inhaler and inhale every morning.  30 capsule  5  . TPN ADULT Inject into the vein every 12 (twelve) hours. tpn 700 pm to 0700 am daily via right chest port a cath      . DISCONTD: iron dextran complex (INFED) 50 MG/ML injection Please give infed infusion (no test dose needed) over 4 hours per pharmacy calculated dose. Ht: 5'2 Wt: 99 pounds Hgb: 12  1 mL  0   Current Facility-Administered Medications  Medication Dose Route Frequency Provider Last Rate Last Dose  . cyanocobalamin ((VITAMIN B-12)) injection 1,000 mcg  1,000 mcg Intramuscular Q30 days Dora M Brodie, MD   1,000 mcg at 07/02/11 1414    Review of Systems Review of Systems  Blood pressure 86/58, pulse 58, temperature 97.4 F (36.3 C), temperature source Temporal, resp. rate 14,  height 5' 2" (1.575 m), weight 105 lb 6 oz (  47.798 kg).  Physical Exam Physical Exam  Vitals reviewed. Constitutional: She appears well-developed.  Neck: Neck supple.  Cardiovascular: Normal rate, regular rhythm and normal heart sounds.   Pulmonary/Chest: Effort normal and breath sounds normal.    Lymphadenopathy:    She has no cervical adenopathy.     Assessment    Exposed port     Plan    She is quite thin and her port has been used frequently.  Her skin has eroded through and her port is now visible.  I told her we need to remove this port this week.  I will not put in another one at that time.        [name] 07/25/2011, 8:56 PM    

## 2011-07-27 NOTE — Progress Notes (Signed)
[employer] here to program pacemaker no pacing required. Earley Abide will return to recheck pacer after sx.

## 2011-07-27 NOTE — Interval H&P Note (Signed)
History and Physical Interval Note:  07/27/2011 3:23 PM  [name]  has presented today for surgery, with the diagnosis of Portacath  The various methods of treatment have been discussed with the patient and family. After consideration of risks, benefits and other options for treatment, the patient has consented to  Procedure(s) (LRB): REMOVAL PORT-A-CATH (N/A) as a surgical intervention .  The patients' history has been reviewed, patient examined, no change in status, stable for surgery.  I have reviewed the patients' chart and labs.  Questions were answered to the patient's satisfaction.     Keedan Sample

## 2011-07-27 NOTE — Op Note (Signed)
Preoperative diagnosis: Right subclavian Port-A-Cath that has eroded through the skin Postoperative diagnosis: Same as above, no infection Procedure: Right subclavian port removal Surgeon: Dr. Harden Mo Anesthesia: Local with IV sedation Specimens: None Drains: None Estimated blood loss: None Complications: None Sponge and needle count correct at end of operation Disposition to recovery in stable condition  Indications: This is a 66 year female is well-known to me who is at a port placed for access, fluid, and total parenteral nutrition. She is very thin. She is Morrie Sheldon off the TPN at this point. However she is due to get a hip replacements weekend she noted what appeared to be her port eroding through the skin. I saw her in the office and indeed it did appear that her port eroding through the skin. We schedule for removal. I discussed this with her orthopedic surgeon as well.  Procedure: After informed consent was obtained the patient was taken to the operating room. She was administered intravenous antibiotics. Sequential compression devices were on her legs. She was placed under monitored anesthesia care. Her right chest was prepped and draped in the standard sterile surgical fashion. A surgical timeout was performed.  I told her before that I would notplace a new port due to the fact that she had an exposed port and she has a pacemaker on the other side. I infiltrated local anesthetic throughout this region. I then reentered the old incision. The port was removed in its entirety. Hemostasis was observed and I closed the incision with 3-0 Vicryl and 4-0 Monocryl. I then debrided the edges of the hole where the port eroded into. I then closed this with 3-0 nylon external sutures. Bacitracin was placed on this. I Dermabonded the other incision. A dressing was placed. She tolerated this well was transferred to recovery in stable condition.

## 2011-07-30 ENCOUNTER — Other Ambulatory Visit (HOSPITAL_COMMUNITY): Payer: Self-pay | Admitting: Orthopaedic Surgery

## 2011-07-30 ENCOUNTER — Encounter (HOSPITAL_COMMUNITY)
Admission: RE | Admit: 2011-07-30 | Discharge: 2011-07-30 | Disposition: A | Discharge status: Home / Self Care | Payer: Medicare Other | Source: Ambulatory Visit | Attending: Surgery | Admitting: Surgery

## 2011-07-30 ENCOUNTER — Ambulatory Visit (HOSPITAL_COMMUNITY)
Admission: RE | Admit: 2011-07-30 | Discharge: 2011-07-30 | Disposition: A | Discharge status: Home / Self Care | Payer: Medicare Other | Source: Ambulatory Visit | Attending: Orthopaedic Surgery | Admitting: Orthopaedic Surgery

## 2011-07-30 ENCOUNTER — Encounter (HOSPITAL_BASED_OUTPATIENT_CLINIC_OR_DEPARTMENT_OTHER): Payer: Self-pay | Admitting: General Surgery

## 2011-07-30 DIAGNOSIS — M1610 Unilateral primary osteoarthritis, unspecified hip: Secondary | ICD-10-CM | POA: Insufficient documentation

## 2011-07-30 DIAGNOSIS — Z01818 Encounter for other preprocedural examination: Secondary | ICD-10-CM | POA: Insufficient documentation

## 2011-07-30 DIAGNOSIS — Z01812 Encounter for preprocedural laboratory examination: Secondary | ICD-10-CM | POA: Insufficient documentation

## 2011-07-30 DIAGNOSIS — M1990 Unspecified osteoarthritis, unspecified site: Secondary | ICD-10-CM

## 2011-07-30 DIAGNOSIS — R918 Other nonspecific abnormal finding of lung field: Secondary | ICD-10-CM | POA: Insufficient documentation

## 2011-07-30 DIAGNOSIS — M169 Osteoarthritis of hip, unspecified: Secondary | ICD-10-CM | POA: Insufficient documentation

## 2011-07-30 LAB — BASIC METABOLIC PANEL
BUN: 21 mg/dL (ref 6–23)
CO2: 30 mEq/L (ref 19–32)
Calcium: 8.1 mg/dL — ABNORMAL LOW (ref 8.4–10.5)
Chloride: 96 mEq/L (ref 96–112)
Creatinine, Ser: 0.79 mg/dL (ref 0.50–1.10)
GFR calc Af Amer: >90 mL/min (ref >90)
GFR calc non Af Amer: 85 mL/min — ABNORMAL LOW (ref >90)
Glucose, Bld: 82 mg/dL (ref 70–99)
Potassium: 4.1 mEq/L (ref 3.5–5.1)
Sodium: 134 mEq/L — ABNORMAL LOW (ref 135–145)

## 2011-07-30 LAB — PROTIME-INR
INR: 0.91 (ref 0.00–1.49)
Prothrombin Time: 12.5 seconds (ref 11.6–15.2)

## 2011-07-30 LAB — CBC
HCT: 35.3 % — ABNORMAL LOW (ref 36.0–46.0)
Hemoglobin: 11.3 g/dL — ABNORMAL LOW (ref 12.0–15.0)
MCH: 28.8 pg (ref 26.0–34.0)
MCHC: 32 g/dL (ref 30.0–36.0)
MCV: 90.1 fL (ref 78.0–100.0)
Platelets: 279 10*3/uL (ref 150–400)
RBC: 3.92 MIL/uL (ref 3.87–5.11)
RDW: 13.8 % (ref 11.5–15.5)
WBC: 7.4 10*3/uL (ref 4.0–10.5)

## 2011-07-30 LAB — APTT: aPTT: 29 seconds (ref 24–37)

## 2011-07-30 LAB — SURGICAL PCR SCREEN
MRSA, PCR: NEGATIVE
Staphylococcus aureus: NEGATIVE

## 2011-07-30 NOTE — Pre-Procedure Instructions (Addendum)
DR. Eliberto Ivory OFFICE FAXED A NOTE OF PULMONARY CLEARANCE FROM DR. SOOD--NOTE IS ON PT'S CHART. EKG REPORT AND CARDIOLOGY OFFICE NOTE IN EPIC AND COPIES ON PT'S CHART - FROM DR. HOCHREIN -DATED 06/04/11. NEUROLOGY OFFICE NOTE 07/10/11 FROM DR. WONG - IN EPIC AND COPY ON PT'S CHART. URINALYSIS REPORT 07/24/11 FROM [facility] [place] - IN EPIC AND COPY ON PT'S CHART AND DOES NOT NEED REPEATING FOR HIP REPLACEMENT SURGERY. CXR, CBC, BMET, PT, PTT WERE DONE TODAY - PREOP - AT [facility].  T/S WILL BE DRAWN AM OF SURGERY.

## 2011-07-30 NOTE — Pre-Procedure Instructions (Signed)
SHERRIE AT DR. CEliberto Ivory OFFICE NOTIFIED BY MESSAGE LEFT ON HER ANSWERING MACHINE - THAT PT STATES DR. Dwain Sarna AND DR. Maureen Ralphs WERE TO TALK AND ARRANGE PICC LINE INSERTION BEFORE HIP REPLACEMENT AND PT WOULD LIKE SHERRIE TO CALL HER WITH INFORMATION REGARDING PICC LINE INSERTION.

## 2011-07-30 NOTE — Pre-Procedure Instructions (Signed)
AMY AT DR. Alben Spittle OFFICE NOTIFIED TO HAVE DR. BLACKMAN REVIEW PT'S PREOP CXR REPORT--IT WAS DONE TODAY AT [facility] HARD TO KNOW IF REPORT IS OKAY AS PT HAS LUNG PROBLEMS--PLEASE ASK DR. BLACKMAN TO REVIEW.  COPY OF THE CXR REPORT IS ON PT'S CHART.

## 2011-07-30 NOTE — Patient Instructions (Signed)
YOUR SURGERY IS SCHEDULED ON:  Friday  5/17  AT 10:00 AM  REPORT TO  SHORT STAY CENTER AT:  7:30 AM      PHONE # FOR SHORT STAY IS [phone]  DO NOT EAT OR DRINK ANYTHING AFTER MIDNIGHT THE NIGHT BEFORE YOUR SURGERY.  YOU MAY BRUSH YOUR TEETH, RINSE OUT YOUR MOUTH--BUT NO WATER, NO FOOD, NO CHEWING GUM, NO MINTS, NO CANDIES, NO CHEWING TOBACCO.  PLEASE TAKE THE FOLLOWING MEDICATIONS THE AM OF YOUR SURGERY WITH A FEW SIPS OF WATER:  AMIODARONE, CARVEDILOL, GABAPENTIN, OXYCODONE-ACETAMINOPHEN, PANTOPRAZOLE.   MAY WEAR FENTANYL PATCH.   USE ALBUTEROL IF NEEDED--BRING TO TAKE TO SURGERY.  BRING NITROGLYCERIN.  USE YOUR SPIRIVA.    IF YOU USE INHALERS--USE YOUR INHALERS THE AM OF YOUR SURGERY AND BRING INHALERS TO THE HOSPITAL -TAKE TO SURGERY.    IF YOU ARE DIABETIC:  DO NOT TAKE ANY DIABETIC MEDICATIONS THE AM OF YOUR SURGERY.  IF YOU TAKE INSULIN IN THE EVENINGS--PLEASE ONLY TAKE 1/2 NORMAL EVENING DOSE THE NIGHT BEFORE YOUR SURGERY.  NO INSULIN THE AM OF YOUR SURGERY.  IF YOU HAVE SLEEP APNEA AND USE CPAP OR BIPAP--PLEASE BRING THE MASK --NOT THE MACHINE-NOT THE TUBING   -JUST THE MASK. DO NOT BRING VALUABLES, MONEY, CREDIT CARDS.  CONTACT LENS, DENTURES / PARTIALS, GLASSES SHOULD NOT BE WORN TO SURGERY AND IN MOST CASES-HEARING AIDS WILL NEED TO BE REMOVED.  BRING YOUR GLASSES CASE, ANY EQUIPMENT NEEDED FOR YOUR CONTACT LENS. FOR PATIENTS ADMITTED TO THE HOSPITAL--CHECK OUT TIME THE DAY OF DISCHARGE IS 11:00 AM.  ALL INPATIENT ROOMS ARE PRIVATE - WITH BATHROOM, TELEPHONE, TELEVISION AND WIFI INTERNET. IF YOU ARE BEING DISCHARGED THE SAME DAY OF YOUR SURGERY--YOU CAN NOT DRIVE YOURSELF HOME--AND SHOULD NOT GO HOME ALONE BY TAXI OR BUS.  NO DRIVING OR OPERATING MACHINERY FOR 24 HOURS FOLLOWING ANESTHESIA / PAIN MEDICATIONS.                            SPECIAL INSTRUCTIONS:  CHLORHEXIDINE SOAP SHOWER (other brand names are Betasept and Hibiclens ) PLEASE SHOWER WITH CHLORHEXIDINE THE  NIGHT BEFORE YOUR SURGERY AND THE AM OF YOUR SURGERY. DO NOT USE CHLORHEXIDINE ON YOUR FACE OR PRIVATE AREAS--YOU MAY USE YOUR NORMAL SOAP THOSE AREAS AND YOUR NORMAL SHAMPOO.  WOMEN SHOULD AVOID SHAVING UNDER ARMS AND SHAVING LEGS 48 HOURS BEFORE USING CHLORHEXIDINE TO AVOID SKIN IRRITATION.  DO NOT USE IF ALLERGIC TO CHLORHEXIDINE.  PLEASE READ OVER ANY  FACT SHEETS THAT YOU WERE GIVEN: MRSA INFORMATION, BLOOD TRANSFUSION INFORMATION.

## 2011-07-31 NOTE — Pre-Procedure Instructions (Signed)
PERIOPERATIVE PRESCRIPTION FOR IMPLANTED CARDIAC DEVICE PROGRAMMING IS ON PT'S CHART -FROM DR. KLEIN--NO DEVICE RE-PROGRAMMING OR MAGNET PLACEMENT NEEDED FOR PT'S PACEMAKER.

## 2011-08-01 ENCOUNTER — Ambulatory Visit (INDEPENDENT_AMBULATORY_CARE_PROVIDER_SITE_OTHER): Payer: Medicare Other | Admitting: Internal Medicine

## 2011-08-01 ENCOUNTER — Telehealth: Payer: Self-pay | Admitting: Internal Medicine

## 2011-08-01 ENCOUNTER — Telehealth (HOSPITAL_COMMUNITY): Payer: Self-pay | Admitting: Orthopaedic Surgery

## 2011-08-01 DIAGNOSIS — E538 Deficiency of other specified B group vitamins: Secondary | ICD-10-CM

## 2011-08-01 DIAGNOSIS — D509 Iron deficiency anemia, unspecified: Secondary | ICD-10-CM

## 2011-08-01 NOTE — Telephone Encounter (Signed)
Spoke with patient and her portacath was removed and she is to have a PICC line placed. Explained to patient that the MD ordering the PICC would arrange her care for PICC. Claudia notified.

## 2011-08-01 NOTE — Telephone Encounter (Signed)
Left a message for patient to call me. 

## 2011-08-02 ENCOUNTER — Other Ambulatory Visit (HOSPITAL_COMMUNITY): Payer: Self-pay | Admitting: Orthopaedic Surgery

## 2011-08-02 ENCOUNTER — Ambulatory Visit (HOSPITAL_COMMUNITY)
Admission: RE | Admit: 2011-08-02 | Discharge: 2011-08-02 | Disposition: A | Discharge status: Home / Self Care | Payer: Medicare Other | Source: Ambulatory Visit | Attending: Orthopaedic Surgery | Admitting: Orthopaedic Surgery

## 2011-08-02 DIAGNOSIS — M1990 Unspecified osteoarthritis, unspecified site: Secondary | ICD-10-CM

## 2011-08-02 DIAGNOSIS — M169 Osteoarthritis of hip, unspecified: Secondary | ICD-10-CM | POA: Insufficient documentation

## 2011-08-02 DIAGNOSIS — M1610 Unilateral primary osteoarthritis, unspecified hip: Secondary | ICD-10-CM | POA: Insufficient documentation

## 2011-08-02 MED ORDER — LIDOCAINE HCL 1 % IJ SOLN
INTRAMUSCULAR | Status: AC
  Filled 2011-08-02: qty 20

## 2011-08-02 NOTE — Discharge Instructions (Signed)
Peripherally Inserted Central Catheter (PICC)  Home Guide  A peripherally inserted central catheter (PICC) is a long, thin, flexible tube that is inserted into a vein in the upper arm. It is a form of intravenous (IV) access. It is considered to be a "central" line because the tip of the PICC ends in a large vein in your chest. This large vein is called the superior vena cava (SVC). The PICC tip ends in the SVC because there is a lot of blood flow in the SVC. This allows medicines and IV fluids to be quickly distributed throughout the body. The PICC is inserted using a sterile technique by a specially trained nurse or physician. After the PICC is inserted, a chest X-ray is done to be sure it is in the correct place.   A PICC may be placed for different reasons, such as:   To give medicines and liquid nutrition that can only be given through a central line. Examples are:   Certain antibiotic treatments.   Chemotherapy.   Total parenteral nutrition (TPN).   To take frequent blood samples.   To give IV fluids and blood products.   If there is difficulty placing a peripheral intravenous (PIV) catheter.  If taken care of properly, a PICC can remain in place for several months. A PICC can also allow patients to go home early. Medicine and PICC care can be managed at home by a family member or home healthcare team.  RISKS AND COMPLICATIONS  Possible problems with a PICC can occasionally occur. This may include:   A clot (thrombus) forming in or at the tip of the PICC. This can cause the PICC to become clogged. A "clot-busting" medicine called tissue plasminogen activator (tPA) can be inserted into the PICC to help break up the clot.   Inflammation of the vein (phlebitis) in which the PICC is placed. Signs of inflammation may include redness, pain at the insertion site, red streaks, or being able to feel a "cord" in the vein where the PICC is located.   Infection in the PICC or at the insertion site. Signs of  infection may include fever, chills, redness, swelling, or pus drainage from the PICC insertion site.   PICC movement (malposition). The PICC tip may migrate from its original position due to excessive physical activity, forceful coughing, sneezing, or vomiting.   A break or cut in the PICC. It is important to not use scissors near the PICC.   Nerve or tendon irritation or injury during PICC insertion.  HOME CARE INSTRUCTIONS  Activity   You may bend your arm and move it freely. If your PICC is near or at the bend of your elbow, avoid activity with repeated motion at the elbow.   Avoid lifting heavy objects as instructed by your caregiver.   Avoid using a crutch with the arm on the same side as your PICC. You may need to use a walker.  PICC Dressing   Keep your PICC bandage (dressing) clean and dry to prevent infection.   Ask your caregiver when you may shower. Ask your caregiver to teach you how to wrap the PICC when you do take a shower.   Do not bathe, swim, or use hot tubs when you have a PICC.   Change the PICC dressing as instructed by your caregiver.   Change your PICC dressing if it becomes loose or wet.  General PICC Care   Check the PICC insertion site daily for leakage, redness, swelling,   or pain.   Flush the PICC as directed by your caregiver. Let your caregiver know right away if the PICC is difficult to flush or does not flush. Do not use force to flush the PICC.   Do not use a syringe that is less than 10 mLs to flush the PICC.   Never pull or tug on the PICC.   Avoid blood pressure checks on the arm with the PICC.   Keep your PICC identification card with you at all times.   Do not take the PICC out yourself. Only a trained clinical professional should remove the PICC.  SEEK IMMEDIATE MEDICAL CARE IF:   Your PICC is accidently pulled all the way out. If this happens, cover the insertion site with a bandage or gauze dressing. Do not throw the PICC away. Your caregiver will need to  inspect it.   Your PICC was tugged or pulled and has partially come out. Do not  push the PICC back in.   There is any type of drainage, redness, or swelling where the PICC enters the skin.   You cannot flush the PICC, it is difficult to flush, or the PICC leaks around the insertion site when it is flushed.   You hear a "flushing" sound when the PICC is flushed.   You have pain, discomfort, or numbness in your arm, shoulder, or jaw on the same side as the PICC .   You feel your heart "racing" or skipping beats.   You notice a hole or tear in the PICC.   You develop chills or a fever.  MAKE SURE YOU:    Understand these instructions.   Will watch your condition.   Will get help right away if you are not doing well or get worse.  Document Released: 09/09/2002 Document Revised: 02/22/2011 Document Reviewed: 07/10/2010  Exit[employer] Patient Information 2012 [employer].

## 2011-08-03 ENCOUNTER — Inpatient Hospital Stay (HOSPITAL_COMMUNITY)
Admission: RE | Admit: 2011-08-03 | Discharge: 2011-08-07 | DRG: 470 | Disposition: A | Discharge status: Home Health | Payer: Medicare Other | Source: Ambulatory Visit | Attending: Orthopaedic Surgery | Admitting: Orthopaedic Surgery

## 2011-08-03 ENCOUNTER — Ambulatory Visit (HOSPITAL_COMMUNITY): Payer: Medicare Other

## 2011-08-03 ENCOUNTER — Encounter (HOSPITAL_COMMUNITY): Payer: Self-pay | Admitting: Anesthesiology

## 2011-08-03 ENCOUNTER — Encounter (HOSPITAL_COMMUNITY): Payer: Self-pay | Admitting: *Deleted

## 2011-08-03 ENCOUNTER — Encounter (HOSPITAL_COMMUNITY)
Admission: RE | Disposition: A | Discharge status: Home Health | Payer: Self-pay | Source: Ambulatory Visit | Attending: Orthopaedic Surgery

## 2011-08-03 ENCOUNTER — Ambulatory Visit (HOSPITAL_COMMUNITY): Payer: Medicare Other | Admitting: Anesthesiology

## 2011-08-03 DIAGNOSIS — Z950 Presence of cardiac pacemaker: Secondary | ICD-10-CM

## 2011-08-03 DIAGNOSIS — I10 Essential (primary) hypertension: Secondary | ICD-10-CM | POA: Diagnosis present

## 2011-08-03 DIAGNOSIS — D62 Acute posthemorrhagic anemia: Secondary | ICD-10-CM | POA: Diagnosis not present

## 2011-08-03 DIAGNOSIS — IMO0001 Reserved for inherently not codable concepts without codable children: Secondary | ICD-10-CM | POA: Diagnosis present

## 2011-08-03 DIAGNOSIS — J449 Chronic obstructive pulmonary disease, unspecified: Secondary | ICD-10-CM | POA: Diagnosis present

## 2011-08-03 DIAGNOSIS — J4489 Other specified chronic obstructive pulmonary disease: Secondary | ICD-10-CM

## 2011-08-03 DIAGNOSIS — Z681 Body mass index (BMI) 19 or less, adult: Secondary | ICD-10-CM

## 2011-08-03 DIAGNOSIS — G8929 Other chronic pain: Secondary | ICD-10-CM

## 2011-08-03 DIAGNOSIS — M1610 Unilateral primary osteoarthritis, unspecified hip: Principal | ICD-10-CM | POA: Diagnosis present

## 2011-08-03 DIAGNOSIS — I2510 Atherosclerotic heart disease of native coronary artery without angina pectoris: Secondary | ICD-10-CM | POA: Diagnosis present

## 2011-08-03 DIAGNOSIS — K219 Gastro-esophageal reflux disease without esophagitis: Secondary | ICD-10-CM | POA: Diagnosis present

## 2011-08-03 DIAGNOSIS — M169 Osteoarthritis of hip, unspecified: Secondary | ICD-10-CM

## 2011-08-03 DIAGNOSIS — R64 Cachexia: Secondary | ICD-10-CM | POA: Diagnosis present

## 2011-08-03 DIAGNOSIS — R52 Pain, unspecified: Secondary | ICD-10-CM

## 2011-08-03 HISTORY — PX: TOTAL HIP ARTHROPLASTY: SHX124

## 2011-08-03 SURGERY — ARTHROPLASTY, HIP, TOTAL, ANTERIOR APPROACH
Anesthesia: Spinal | Site: Hip | Laterality: Right | Wound class: Clean

## 2011-08-03 MED ORDER — MORPHINE SULFATE (PF) 1 MG/ML IV SOLN
INTRAVENOUS | Status: DC
  Administered 2011-08-03: 12:00:00 via INTRAVENOUS
  Administered 2011-08-03: 11 mg via INTRAVENOUS
  Administered 2011-08-03: 5 mg via INTRAVENOUS
  Administered 2011-08-03: 8 mg via INTRAVENOUS
  Administered 2011-08-04: 4.99 mg via INTRAVENOUS
  Administered 2011-08-04: 3 mg via INTRAVENOUS
  Administered 2011-08-04: 2 mg via INTRAVENOUS
  Administered 2011-08-04: 5 mg via INTRAVENOUS
  Administered 2011-08-05: 0.67 mg via INTRAVENOUS
  Administered 2011-08-05: 4 mg via INTRAVENOUS
  Administered 2011-08-05: 6 mg via INTRAVENOUS
  Administered 2011-08-05: 9 mg via INTRAVENOUS
  Administered 2011-08-06: 04:00:00 via INTRAVENOUS
  Administered 2011-08-06: 5 mg via INTRAVENOUS
  Administered 2011-08-06: 2.61 mg via INTRAVENOUS
  Filled 2011-08-03 (×3): qty 25

## 2011-08-03 MED ORDER — OXYCODONE HCL 5 MG PO TABS
5.0000 mg | ORAL_TABLET | ORAL | Status: DC | PRN
  Administered 2011-08-04: 10 mg via ORAL
  Administered 2011-08-05: 5 mg via ORAL
  Administered 2011-08-06 (×2): 10 mg via ORAL
  Filled 2011-08-03 (×2): qty 1
  Filled 2011-08-03 (×3): qty 2
  Filled 2011-08-03: qty 1

## 2011-08-03 MED ORDER — ALBUTEROL SULFATE HFA 108 (90 BASE) MCG/ACT IN AERS
2.0000 | INHALATION_SPRAY | Freq: Four times a day (QID) | RESPIRATORY_TRACT | Status: DC | PRN
  Filled 2011-08-03: qty 6.7

## 2011-08-03 MED ORDER — DIPHENHYDRAMINE HCL 12.5 MG/5ML PO ELIX: 12.5000 mg | ORAL_SOLUTION | Freq: Four times a day (QID) | ORAL | Status: DC | PRN

## 2011-08-03 MED ORDER — MENTHOL 3 MG MT LOZG: 1.0000 | LOZENGE | OROMUCOSAL | Status: DC | PRN

## 2011-08-03 MED ORDER — LACTATED RINGERS IV SOLN: INTRAVENOUS | Status: DC

## 2011-08-03 MED ORDER — TIOTROPIUM BROMIDE MONOHYDRATE 18 MCG IN CAPS
18.0000 ug | ORAL_CAPSULE | Freq: Every day | RESPIRATORY_TRACT | Status: DC
  Administered 2011-08-04 – 2011-08-07 (×4): 18 ug via RESPIRATORY_TRACT
  Filled 2011-08-03: qty 5

## 2011-08-03 MED ORDER — ONDANSETRON HCL 4 MG/2ML IJ SOLN
INTRAMUSCULAR | Status: DC | PRN
  Administered 2011-08-03 (×2): 2 mg via INTRAVENOUS

## 2011-08-03 MED ORDER — CEFAZOLIN SODIUM 1-5 GM-% IV SOLN
INTRAVENOUS | Status: AC
  Filled 2011-08-03: qty 50

## 2011-08-03 MED ORDER — DOCUSATE SODIUM 100 MG PO CAPS
100.0000 mg | ORAL_CAPSULE | Freq: Two times a day (BID) | ORAL | Status: DC
  Administered 2011-08-03 – 2011-08-07 (×9): 100 mg via ORAL

## 2011-08-03 MED ORDER — ONDANSETRON HCL 4 MG/2ML IJ SOLN
4.0000 mg | Freq: Four times a day (QID) | INTRAMUSCULAR | Status: DC | PRN
  Administered 2011-08-06: 4 mg via INTRAVENOUS
  Filled 2011-08-03 (×2): qty 2

## 2011-08-03 MED ORDER — CEFAZOLIN SODIUM 1-5 GM-% IV SOLN
1.0000 g | Freq: Four times a day (QID) | INTRAVENOUS | Status: AC
Start: 2011-08-03 — End: 2011-08-04
  Administered 2011-08-03 – 2011-08-04 (×3): 1 g via INTRAVENOUS
  Filled 2011-08-03 (×3): qty 50

## 2011-08-03 MED ORDER — DIPHENHYDRAMINE HCL 50 MG/ML IJ SOLN: 12.5000 mg | Freq: Four times a day (QID) | INTRAMUSCULAR | Status: DC | PRN

## 2011-08-03 MED ORDER — MIDAZOLAM HCL 5 MG/5ML IJ SOLN
INTRAMUSCULAR | Status: DC | PRN
  Administered 2011-08-03: 1 mg via INTRAVENOUS

## 2011-08-03 MED ORDER — LACTATED RINGERS IV SOLN
INTRAVENOUS | Status: DC
  Administered 2011-08-03 (×2): via INTRAVENOUS

## 2011-08-03 MED ORDER — CYCLOBENZAPRINE HCL 10 MG PO TABS
10.0000 mg | ORAL_TABLET | Freq: Three times a day (TID) | ORAL | Status: DC | PRN
  Administered 2011-08-03 – 2011-08-07 (×2): 10 mg via ORAL
  Filled 2011-08-03 (×2): qty 1

## 2011-08-03 MED ORDER — ACETAMINOPHEN 325 MG PO TABS: 650.0000 mg | ORAL_TABLET | Freq: Four times a day (QID) | ORAL | Status: DC | PRN

## 2011-08-03 MED ORDER — FUROSEMIDE 20 MG PO TABS
20.0000 mg | ORAL_TABLET | Freq: Every day | ORAL | Status: DC
  Administered 2011-08-03 – 2011-08-07 (×5): 20 mg via ORAL
  Filled 2011-08-03 (×5): qty 1

## 2011-08-03 MED ORDER — SODIUM CHLORIDE 0.9 % IV SOLN
INTRAVENOUS | Status: DC
  Administered 2011-08-04 – 2011-08-05 (×3): via INTRAVENOUS

## 2011-08-03 MED ORDER — BUPIVACAINE HCL (PF) 0.5 % IJ SOLN
INTRAMUSCULAR | Status: DC | PRN
  Administered 2011-08-03: 3 mL

## 2011-08-03 MED ORDER — NITROGLYCERIN 0.4 MG SL SUBL: 0.4000 mg | SUBLINGUAL_TABLET | SUBLINGUAL | Status: DC | PRN

## 2011-08-03 MED ORDER — MEPERIDINE HCL 50 MG/ML IJ SOLN: 6.2500 mg | INTRAMUSCULAR | Status: DC | PRN

## 2011-08-03 MED ORDER — FENTANYL CITRATE 0.05 MG/ML IJ SOLN
INTRAMUSCULAR | Status: DC | PRN
  Administered 2011-08-03 (×3): 25 ug via INTRAVENOUS

## 2011-08-03 MED ORDER — PANTOPRAZOLE SODIUM 40 MG PO TBEC
40.0000 mg | DELAYED_RELEASE_TABLET | Freq: Every day | ORAL | Status: DC
  Administered 2011-08-03 – 2011-08-07 (×5): 40 mg via ORAL
  Filled 2011-08-03 (×5): qty 1

## 2011-08-03 MED ORDER — ALUM & MAG HYDROXIDE-SIMETH 200-200-20 MG/5ML PO SUSP: 30.0000 mL | ORAL | Status: DC | PRN

## 2011-08-03 MED ORDER — METOCLOPRAMIDE HCL 10 MG PO TABS
5.0000 mg | ORAL_TABLET | Freq: Three times a day (TID) | ORAL | Status: DC | PRN
  Administered 2011-08-06: 10 mg via ORAL
  Filled 2011-08-03: qty 1

## 2011-08-03 MED ORDER — RIVAROXABAN 10 MG PO TABS
10.0000 mg | ORAL_TABLET | Freq: Every day | ORAL | Status: DC
  Administered 2011-08-04: 10 mg via ORAL
  Filled 2011-08-03 (×2): qty 1

## 2011-08-03 MED ORDER — HYDROCODONE-ACETAMINOPHEN 5-325 MG PO TABS
1.0000 | ORAL_TABLET | ORAL | Status: DC | PRN
  Administered 2011-08-03 – 2011-08-06 (×3): 2 via ORAL
  Administered 2011-08-06: 1 via ORAL
  Administered 2011-08-06 – 2011-08-07 (×2): 2 via ORAL
  Administered 2011-08-07: 1 via ORAL
  Administered 2011-08-07: 2 via ORAL
  Filled 2011-08-03 (×4): qty 2
  Filled 2011-08-03: qty 1
  Filled 2011-08-03 (×3): qty 2

## 2011-08-03 MED ORDER — MORPHINE SULFATE (PF) 1 MG/ML IV SOLN
INTRAVENOUS | Status: AC
  Administered 2011-08-04: 4 mg via INTRAVENOUS
  Filled 2011-08-03: qty 25

## 2011-08-03 MED ORDER — PHENYLEPHRINE HCL 10 MG/ML IJ SOLN
INTRAMUSCULAR | Status: DC | PRN
  Administered 2011-08-03: 40 ug via INTRAVENOUS
  Administered 2011-08-03: 20 ug via INTRAVENOUS

## 2011-08-03 MED ORDER — NALOXONE HCL 0.4 MG/ML IJ SOLN: 0.4000 mg | INTRAMUSCULAR | Status: DC | PRN

## 2011-08-03 MED ORDER — ONDANSETRON HCL 4 MG/2ML IJ SOLN: 4.0000 mg | Freq: Four times a day (QID) | INTRAMUSCULAR | Status: DC | PRN

## 2011-08-03 MED ORDER — SODIUM CHLORIDE 0.9 % IJ SOLN: 9.0000 mL | INTRAMUSCULAR | Status: DC | PRN

## 2011-08-03 MED ORDER — 0.9 % SODIUM CHLORIDE (POUR BTL) OPTIME
TOPICAL | Status: DC | PRN
  Administered 2011-08-03: 1000 mL

## 2011-08-03 MED ORDER — BUPIVACAINE HCL (PF) 0.5 % IJ SOLN
INTRAMUSCULAR | Status: AC
  Filled 2011-08-03: qty 30

## 2011-08-03 MED ORDER — CEFAZOLIN SODIUM 1-5 GM-% IV SOLN
1.0000 g | INTRAVENOUS | Status: AC
  Administered 2011-08-03: 1 g via INTRAVENOUS

## 2011-08-03 MED ORDER — CARVEDILOL 6.25 MG PO TABS
6.2500 mg | ORAL_TABLET | Freq: Two times a day (BID) | ORAL | Status: DC
  Administered 2011-08-03 – 2011-08-07 (×8): 6.25 mg via ORAL
  Filled 2011-08-03 (×11): qty 1

## 2011-08-03 MED ORDER — DIPHENHYDRAMINE HCL 12.5 MG/5ML PO ELIX: 12.5000 mg | ORAL_SOLUTION | ORAL | Status: DC | PRN

## 2011-08-03 MED ORDER — TEMAZEPAM 15 MG PO CAPS
15.0000 mg | ORAL_CAPSULE | Freq: Every evening | ORAL | Status: DC | PRN
  Administered 2011-08-03: 30 mg via ORAL
  Administered 2011-08-04 (×2): 15 mg via ORAL
  Administered 2011-08-05: 30 mg via ORAL
  Administered 2011-08-06: 15 mg via ORAL
  Filled 2011-08-03 (×2): qty 2
  Filled 2011-08-03: qty 1
  Filled 2011-08-03: qty 2
  Filled 2011-08-03: qty 1

## 2011-08-03 MED ORDER — PROPOFOL 10 MG/ML IV EMUL
INTRAVENOUS | Status: DC | PRN
  Administered 2011-08-03: 50 ug/kg/min via INTRAVENOUS

## 2011-08-03 MED ORDER — FENTANYL 50 MCG/HR TD PT72
50.0000 ug | MEDICATED_PATCH | TRANSDERMAL | Status: DC
  Administered 2011-08-05: 50 ug via TRANSDERMAL
  Filled 2011-08-03: qty 1

## 2011-08-03 MED ORDER — ONDANSETRON HCL 4 MG/2ML IJ SOLN: 4.0000 mg | Freq: Once | INTRAMUSCULAR | Status: DC | PRN

## 2011-08-03 MED ORDER — METOCLOPRAMIDE HCL 5 MG/ML IJ SOLN: 5.0000 mg | Freq: Three times a day (TID) | INTRAMUSCULAR | Status: DC | PRN

## 2011-08-03 MED ORDER — EPHEDRINE SULFATE 50 MG/ML IJ SOLN
INTRAMUSCULAR | Status: DC | PRN
  Administered 2011-08-03 (×2): 5 mg via INTRAVENOUS
  Administered 2011-08-03: 10 mg via INTRAVENOUS
  Administered 2011-08-03 (×2): 5 mg via INTRAVENOUS

## 2011-08-03 MED ORDER — ONDANSETRON HCL 4 MG PO TABS
4.0000 mg | ORAL_TABLET | Freq: Four times a day (QID) | ORAL | Status: DC | PRN
  Administered 2011-08-06: 4 mg via ORAL
  Filled 2011-08-03: qty 1

## 2011-08-03 MED ORDER — ACETAMINOPHEN 650 MG RE SUPP: 650.0000 mg | Freq: Four times a day (QID) | RECTAL | Status: DC | PRN

## 2011-08-03 MED ORDER — PHENYLEPHRINE HCL 10 MG/ML IJ SOLN
10000.0000 ug | INTRAVENOUS | Status: DC | PRN
  Administered 2011-08-03: 30 ug/min via INTRAVENOUS

## 2011-08-03 MED ORDER — HYDROMORPHONE HCL PF 1 MG/ML IJ SOLN: 0.2500 mg | INTRAMUSCULAR | Status: DC | PRN

## 2011-08-03 MED ORDER — AMIODARONE HCL 200 MG PO TABS
200.0000 mg | ORAL_TABLET | Freq: Every day | ORAL | Status: DC
  Administered 2011-08-04 – 2011-08-07 (×4): 200 mg via ORAL
  Filled 2011-08-03 (×4): qty 1

## 2011-08-03 MED ORDER — MORPHINE SULFATE 2 MG/ML IJ SOLN: 1.0000 mg | INTRAMUSCULAR | Status: DC | PRN

## 2011-08-03 MED ORDER — LACTATED RINGERS IV SOLN
INTRAVENOUS | Status: DC | PRN
  Administered 2011-08-03: 09:00:00 via INTRAVENOUS

## 2011-08-03 MED ORDER — GABAPENTIN 600 MG PO TABS
600.0000 mg | ORAL_TABLET | Freq: Four times a day (QID) | ORAL | Status: DC
  Administered 2011-08-03 (×3): 600 mg via ORAL
  Filled 2011-08-03 (×7): qty 1

## 2011-08-03 MED ORDER — HETASTARCH-ELECTROLYTES 6 % IV SOLN
INTRAVENOUS | Status: DC | PRN
  Administered 2011-08-03: 11:00:00 via INTRAVENOUS

## 2011-08-03 MED ORDER — PHENOL 1.4 % MT LIQD: 1.0000 | OROMUCOSAL | Status: DC | PRN

## 2011-08-03 SURGICAL SUPPLY — 35 items
BAG SPEC THK2 15X12 ZIP CLS (MISCELLANEOUS) ×1
BAG ZIPLOCK 12X15 (MISCELLANEOUS) ×2 IMPLANT
BLADE SAW SGTL 18X1.27X75 (BLADE) ×2 IMPLANT
CELLS DAT CNTRL 66122 CELL SVR (MISCELLANEOUS) ×1 IMPLANT
CLOTH BEACON ORANGE TIMEOUT ST (SAFETY) ×2 IMPLANT
DRAPE C-ARM 42X72 X-RAY (DRAPES) ×2 IMPLANT
DRAPE STERI IOBAN 125X83 (DRAPES) ×2 IMPLANT
DRAPE U-SHAPE 47X51 STRL (DRAPES) ×6 IMPLANT
DRSG MEPILEX BORDER 4X8 (GAUZE/BANDAGES/DRESSINGS) ×2 IMPLANT
DURAPREP 26ML APPLICATOR (WOUND CARE) ×2 IMPLANT
ELECT BLADE TIP CTD 4 INCH (ELECTRODE) ×2 IMPLANT
ELECT REM PT RETURN 9FT ADLT (ELECTROSURGICAL) ×2
ELECTRODE REM PT RTRN 9FT ADLT (ELECTROSURGICAL) ×1 IMPLANT
FACESHIELD LNG OPTICON STERILE (SAFETY) ×6 IMPLANT
GAUZE XEROFORM 1X8 LF (GAUZE/BANDAGES/DRESSINGS) ×2 IMPLANT
GLOVE BIO SURGEON STRL SZ7 (GLOVE) ×2 IMPLANT
GLOVE BIO SURGEON STRL SZ7.5 (GLOVE) ×2 IMPLANT
GLOVE BIOGEL PI IND STRL 7.5 (GLOVE) IMPLANT
GLOVE BIOGEL PI IND STRL 8 (GLOVE) ×1 IMPLANT
GLOVE BIOGEL PI INDICATOR 7.5 (GLOVE)
GLOVE BIOGEL PI INDICATOR 8 (GLOVE) ×1
GLOVE ECLIPSE 7.0 STRL STRAW (GLOVE) IMPLANT
GOWN STRL REIN XL XLG (GOWN DISPOSABLE) ×4 IMPLANT
KIT BASIN OR (CUSTOM PROCEDURE TRAY) ×2 IMPLANT
PACK TOTAL JOINT (CUSTOM PROCEDURE TRAY) ×2 IMPLANT
PADDING CAST COTTON 6X4 STRL (CAST SUPPLIES) ×2 IMPLANT
RTRCTR WOUND ALEXIS 18CM MED (MISCELLANEOUS) ×2
STAPLER VISISTAT 35W (STAPLE) ×2 IMPLANT
SUT ETHIBOND NAB CT1 #1 30IN (SUTURE) ×4 IMPLANT
SUT VIC AB 2-0 CT1 27 (SUTURE) ×4
SUT VIC AB 2-0 CT1 TAPERPNT 27 (SUTURE) ×2 IMPLANT
SUT VLOC 180 0 24IN GS25 (SUTURE) ×2 IMPLANT
TOWEL OR 17X26 10 PK STRL BLUE (TOWEL DISPOSABLE) ×4 IMPLANT
TOWEL OR NON WOVEN STRL DISP B (DISPOSABLE) ×2 IMPLANT
TRAY FOLEY CATH 14FRSI W/METER (CATHETERS) ×2 IMPLANT

## 2011-08-03 NOTE — Anesthesia Procedure Notes (Signed)
Spinal Patient location during procedure: OR Staffing Anesthesiologist: Ollis Daudelin Performed by: anesthesiologist  Preanesthetic Checklist Completed: patient identified, site marked, surgical consent, pre-op evaluation, timeout performed, IV checked, risks and benefits discussed and monitors and equipment checked Spinal Block Patient position: sitting Prep: Betadine Patient monitoring: heart rate, continuous pulse ox and blood pressure Approach: left paramedian Location: L2-3 Injection technique: single-shot Needle Needle type: Spinocan  Needle gauge: 22 G Needle length: 9 cm Additional Notes Expiration date of kit checked and confirmed. Patient tolerated procedure well, without complications.     

## 2011-08-03 NOTE — H&P (Signed)
[name] is an 66 y.o. female.   Chief Complaint:   Severe right hip pain HPI:   66 yo female with multiple medical problems as well as severe right hip pain.  X-rays show bone-on-bone wear.  Her quality of life and mobility are poor as a result of this.  She wishes to proceed with a right total hip replacement.  The risks of blood loss, nerve injury, DVT, PE and death have all been discussed.  The goals are decreased pain and improved mobility.  Past Medical History  Diagnosis Date  . GERD (gastroesophageal reflux disease)   . CAD (coronary artery disease)     mild disease per cath in 2011  . LBBB (left bundle branch block)   . Atrial fibrillation     on amiodarone  . Venous embolism and thrombosis of subclavian vein     after pacemaker insertion Oct 2011  . Pleural effusion      s/p right thoracentesis 03/09  . Small bowel obstruction   . Urinary incontinence      -INTERSTIM IMPLANT NOT FUNCTIONING PER PT  . RLS (restless legs syndrome)   . Primary dilated cardiomyopathy     EF 45 to 50% per echo in Jan 2012  . Vitamin B12 deficiency   . Gastroparesis   . Chronic nausea   . Chronic abdominal pain   . Chronic pain syndrome   . Pacemaker     CRT therapy; followed by Dr. Graciela Husbands  . H/O: GI bleed     from Pradaxa  . Oxygen dependent     2 liters via nasal cannula at all times  . CHF (congestive heart failure)   . Anemia   . Hx of stress fx aug 2011    right hip   . Elbow fracture, left aug 2012  . Arthritis     right hip  . Hx of gastric ulcer   . Diverticulitis   . HTN (hypertension)     under control; has been on med. x "years"  . COPD (chronic obstructive pulmonary disease)     continuous O2  . Hx MRSA infection   . Pneumonia - 03/09, 12/09, 02/10, 06/10    MOST RECENT FEB 2013  . Fibromyalgia     COUPLE OF TIMES A YEAR-PT HAS EPISODES OF CONFUSION-USUALLY INVOLVES DAY/NIGHT REVERSAL AND EPISODES OF TWITCHING AND FALLS--SEES DR. Modesto Charon - NEUROLOGIST-LAST SEEN  07/10/11  . Elevated liver enzymes   . Blood transfusion     Past Surgical History  Procedure Date  . Appendectomy   . Pacemaker placement 12/21/2009  . Peg removed     with complications  . Tonsillectomy age 19  . Interstim implant revision 03/06/2011    Procedure: REVISION OF Leane Platt;  Surgeon: Martina Sinner, MD;  Location: [facility];  Service: Urology;  Laterality: N/A;  Replacement of Neurostimulator  . Portacath placement 12/16/2009  . Total abdominal hysterectomy     complete  . Interstim implant placement 05/28/2006 - stage I    06/05/2006 - stage II  . Interstim implant revision 10/23/2007  . Cystoscopy with injection 04/30/2006    transurethral collagen injection; incision vaginal stenosis  . Small intestine surgery 05/20/2001    ex. lap., resection of small bowel stricture; gastrostomy; insertion central line  . Cardiac catheterization 04/08/2006, 11/16/2009  . Cystoscopy 11/11/2008  . Cystoscopy w/ retrogrades 11/11/2008    right  . Colectomy     Intestinal resection (5times)  . Colectomy 02/04/2000  ex. lap., intra-abd. subtotal colectomy with ileosigmoid colon anastomosies and lysis of adhesions  . Gastrocutaneous fistula closure   . Exploratory laparotomy 04/27/2009    lysis of adhesions, gastrostomy tube  . Port-a-cath removal 07/27/2011    Procedure: REMOVAL PORT-A-CATH;  Surgeon: Emelia Loron, MD;  Location: [facility];  Service: General;  Laterality: N/A;    Family History  Problem Relation Age of Onset  . Heart disease Father   . Stroke Mother   . Heart disease Mother   . Colon cancer Neg Hx   . Heart disease Brother   . Breast cancer Maternal Aunt   . Heart disease Paternal Aunt   . Diabetes Paternal Grandfather    Social History:  reports that she quit smoking about 25 years ago. She has never used smokeless tobacco. She reports that she does not drink alcohol or use illicit drugs.  Allergies:  Allergies  Allergen Reactions  .  Dabigatran Etexilate Mesylate Other (See Comments)    INTERNAL BLEEDING  . Pentazocine Lactate Other (See Comments)    HALLUCINATIONS  . Sulfonamide Derivatives Other (See Comments)    JITTERINESS     Medications Prior to Admission  Medication Sig Dispense Refill  . albuterol (PROAIR HFA) 108 (90 BASE) MCG/ACT inhaler Inhale 2 puffs into the lungs every 6 (six) hours as needed for wheezing.  1 Inhaler  5  . amiodarone (PACERONE) 200 MG tablet Take 1 tablet (200 mg total) by mouth every morning.  30 tablet  6  . carvedilol (COREG) 6.25 MG tablet Take 1 tablet (6.25 mg total) by mouth 2 (two) times daily with a meal.  60 tablet  4  . cyclobenzaprine (FLEXERIL) 10 MG tablet Take 10 mg by mouth 3 (three) times daily as needed. For pain      . denosumab (PROLIA) 60 MG/ML SOLN Inject 60 mg into the skin every 6 (six) months. Twice a year      . etodolac (LODINE) 400 MG tablet Take 400 mg by mouth 2 (two) times daily.       . fentaNYL (DURAGESIC - DOSED MCG/HR) 50 MCG/HR Place 1 patch (50 mcg total) onto the skin every 3 (three) days.  10 patch  0  . furosemide (LASIX) 20 MG tablet Take 1 tablet (20 mg total) by mouth every morning.  30 tablet  6  . gabapentin (NEURONTIN) 600 MG tablet Take 1 tablet (600 mg total) by mouth 4 (four) times daily.  120 tablet  2  . nitroGLYCERIN (NITROSTAT) 0.4 MG SL tablet Place 1 tablet (0.4 mg total) under the tongue every 5 (five) minutes as needed for chest pain. CHEST PAIN   25 tablet  11  . oxyCODONE-acetaminophen (PERCOCET) 10-325 MG per tablet Take 1 tablet by mouth every 4 (four) hours as needed. PAIN  120 tablet  0  . oxyCODONE-acetaminophen (PERCOCET) 10-325 MG per tablet Take 1 tablet by mouth every 6 (six) hours as needed for pain.  20 tablet  0  . pantoprazole (PROTONIX) 40 MG tablet Take 40 mg by mouth daily with breakfast.      . promethazine (PHENERGAN) 25 MG tablet Take 1 tablet (25 mg total) by mouth every 8 (eight) hours as needed for nausea.  40  tablet  0  . temazepam (RESTORIL) 15 MG capsule Take 15-30 mg by mouth at bedtime as needed. For sleep      . tiotropium (SPIRIVA HANDIHALER) 18 MCG inhalation capsule Place 1 capsule (18 mcg total) into  inhaler and inhale every morning.  30 capsule  5    Results for orders placed during the hospital encounter of 08/03/11 (from the past 48 hour(s))  TYPE AND SCREEN     Status: Normal   Collection Time   08/03/11  8:30 AM      Component Value Range Comment   ABO/RH(D) A NEG      Antibody Screen NEG      Sample Expiration 08/06/2011      Ir Fluoro Guide Cv Line Right  08/02/2011  *RADIOLOGY REPORT*  Clinical Data: preoperative access, poor peripheral veins, hip osteoarthritis, plan for hip replacement  PICC LINE PLACEMENT WITH ULTRASOUND AND FLUOROSCOPIC  GUIDANCE  Fluoroscopy Time: 1.2 minutes.  The right arm was prepped with chlorhexidine, draped in the usual sterile fashion using maximum barrier technique (cap and mask, sterile gown, sterile gloves, large sterile sheet, hand hygiene and cutaneous antisepsis) and infiltrated locally with 1% Lidocaine.  Ultrasound demonstrated patency of the right brachial vein, and this was documented with an image.  Under real-time ultrasound guidance, this vein was accessed with a 21 gauge micropuncture needle and image documentation was performed.  The needle was exchanged over a guidewire for a peel-away sheath through which a 5 [place] double lumen PICC trimmed to 36 cm was advanced, positioned with its tip at the lower SVC/right atrial junction.  Fluoroscopy during the procedure and fluoro spot radiograph confirms appropriate catheter position.  The catheter was flushed, secured to the skin with Prolene sutures, and covered with a sterile dressing.  Complications:  No immediate  IMPRESSION: Successful right arm PICC line placement with ultrasound and fluoroscopic guidance.  The catheter is ready for use.  Original Report Authenticated By: Judie Petit. Ruel Favors, M.D.    Ir US Guide Vasc Access Right  08/02/2011  *RADIOLOGY REPORT*  Clinical Data: preoperative access, poor peripheral veins, hip osteoarthritis, plan for hip replacement  PICC LINE PLACEMENT WITH ULTRASOUND AND FLUOROSCOPIC  GUIDANCE  Fluoroscopy Time: 1.2 minutes.  The right arm was prepped with chlorhexidine, draped in the usual sterile fashion using maximum barrier technique (cap and mask, sterile gown, sterile gloves, large sterile sheet, hand hygiene and cutaneous antisepsis) and infiltrated locally with 1% Lidocaine.  Ultrasound demonstrated patency of the right brachial vein, and this was documented with an image.  Under real-time ultrasound guidance, this vein was accessed with a 21 gauge micropuncture needle and image documentation was performed.  The needle was exchanged over a guidewire for a peel-away sheath through which a 5 [place] double lumen PICC trimmed to 36 cm was advanced, positioned with its tip at the lower SVC/right atrial junction.  Fluoroscopy during the procedure and fluoro spot radiograph confirms appropriate catheter position.  The catheter was flushed, secured to the skin with Prolene sutures, and covered with a sterile dressing.  Complications:  No immediate  IMPRESSION: Successful right arm PICC line placement with ultrasound and fluoroscopic guidance.  The catheter is ready for use.  Original Report Authenticated By: Judie Petit. Ruel Favors, M.D.    Review of Systems  All other systems reviewed and are negative.    Blood pressure 63/45, pulse 96, temperature 98.5 F (36.9 C), temperature source Oral, resp. rate 16, SpO2 90.00%. Physical Exam  Constitutional: She is oriented to person, place, and time. She appears cachectic. She is cooperative.  HENT:  Head: Normocephalic and atraumatic.  Eyes: EOM are normal. Pupils are equal, round, and reactive to light.  Neck: Normal range of motion. Neck supple.  Cardiovascular: Normal rate and regular rhythm.   Respiratory: Effort  normal and breath sounds normal.  GI: Soft. Bowel sounds are normal.  Musculoskeletal:       Right hip: She exhibits decreased range of motion, decreased strength, bony tenderness and crepitus.  Neurological: She is alert and oriented to person, place, and time.  Skin: Skin is warm and dry.  Psychiatric: She has a normal mood and affect.     Assessment/Plan To the OR for a right total hip replacement.  Jadynn Epping Y 08/03/2011, 9:26 AM

## 2011-08-03 NOTE — Progress Notes (Signed)
Oxygen 2 liters per min continously

## 2011-08-03 NOTE — Anesthesia Preprocedure Evaluation (Signed)
Anesthesia Evaluation  Patient identified by MRN, date of birth, ID band Patient awake    Reviewed: Allergy & Precautions, H&P , NPO status , Patient's Chart, lab work & pertinent test results  Airway Mallampati: I TM Distance: >3 FB Neck ROM: Full    Dental   Pulmonary COPD oxygen dependent,    Pulmonary exam normal       Cardiovascular hypertension, + CAD + dysrhythmias Atrial Fibrillation + pacemaker Rhythm:Irregular Rate:Normal     Neuro/Psych  Neuromuscular disease    GI/Hepatic GERD-  ,  Endo/Other    Renal/GU      Musculoskeletal  (+) Fibromyalgia -  Abdominal   Peds  Hematology   Anesthesia Other Findings   Reproductive/Obstetrics                           Anesthesia Physical  Anesthesia Plan  ASA: III  Anesthesia Plan: Spinal   Post-op Pain Management:    Induction: Intravenous  Airway Management Planned: Simple Face Mask  Additional Equipment:   Intra-op Plan:   Post-operative Plan: Extubation in OR  Informed Consent: I have reviewed the patients History and Physical, chart, labs and discussed the procedure including the risks, benefits and alternatives for the proposed anesthesia with the patient or authorized representative who has indicated his/her understanding and acceptance.     Plan Discussed with: CRNA and Surgeon  Anesthesia Plan Comments:         Anesthesia Quick Evaluation

## 2011-08-03 NOTE — Progress Notes (Signed)
o2 at 2 liters per hour

## 2011-08-03 NOTE — Brief Op Note (Signed)
08/03/2011  11:21 AM  PATIENT:  [name]  66 y.o. female  PRE-OPERATIVE DIAGNOSIS:  Severe arthritis right hip  POST-OPERATIVE DIAGNOSIS:  Severe arthritis right hip  PROCEDURE:  Procedure(s) (LRB): TOTAL HIP ARTHROPLASTY ANTERIOR APPROACH (Right)  SURGEON:  Surgeon(s) and Role:    * Kathryne Hitch, MD - Primary  PHYSICIAN ASSISTANT:   ASSISTANTS: none   ANESTHESIA:   spinal  EBL:  Total I/O In: -  Out: 350 [Blood:350]  BLOOD ADMINISTERED:none  DRAINS: none   LOCAL MEDICATIONS USED:  NONE  SPECIMEN:  No Specimen  DISPOSITION OF SPECIMEN:  N/A  COUNTS:  YES  TOURNIQUET:  * No tourniquets in log *  DICTATION: .Other Dictation: Dictation Number [id number]  PLAN OF CARE: Admit to inpatient   PATIENT DISPOSITION:  PACU - hemodynamically stable.   Delay start of Pharmacological VTE agent (>24hrs) due to surgical blood loss or risk of bleeding: no

## 2011-08-03 NOTE — Transfer of Care (Signed)
Immediate Anesthesia Transfer of Care Note  Patient: [name]  Procedure(s) Performed: Procedure(s) (LRB): TOTAL HIP ARTHROPLASTY ANTERIOR APPROACH (Right)  Patient Location: PACU  Anesthesia Type: Spinal  Level of Consciousness: awake, alert , oriented, patient cooperative and responds to stimulation  Airway & Oxygen Therapy: Patient Spontanous Breathing and Patient connected to face mask oxygen  Post-op Assessment: Report given to PACU RN and Post -op Vital signs reviewed and stable  Post vital signs: stable  Complications: No apparent anesthesia complications

## 2011-08-03 NOTE — Progress Notes (Signed)
Utilization review completed.  

## 2011-08-03 NOTE — Anesthesia Postprocedure Evaluation (Signed)
  Anesthesia Post-op Note  Patient: [name]  Procedure(s) Performed: Procedure(s) (LRB): TOTAL HIP ARTHROPLASTY ANTERIOR APPROACH (Right)  Patient Location: PACU  Anesthesia Type: Spinal  Level of Consciousness: awake and alert   Airway and Oxygen Therapy: Patient Spontanous Breathing  Post-op Pain: mild  Post-op Assessment: Post-op Vital signs reviewed, Patient's Cardiovascular Status Stable, Respiratory Function Stable, Patent Airway and No signs of Nausea or vomiting  Post-op Vital Signs: stable  Complications: No apparent anesthesia complications

## 2011-08-04 LAB — BASIC METABOLIC PANEL
BUN: 15 mg/dL (ref 6–23)
CO2: 27 mEq/L (ref 19–32)
Calcium: 6.6 mg/dL — ABNORMAL LOW (ref 8.4–10.5)
Chloride: 102 mEq/L (ref 96–112)
Creatinine, Ser: 0.87 mg/dL (ref 0.50–1.10)
GFR calc Af Amer: 79 mL/min — ABNORMAL LOW (ref >90)
GFR calc non Af Amer: 68 mL/min — ABNORMAL LOW (ref >90)
Glucose, Bld: 90 mg/dL (ref 70–99)
Potassium: 3.9 mEq/L (ref 3.5–5.1)
Sodium: 134 mEq/L — ABNORMAL LOW (ref 135–145)

## 2011-08-04 LAB — CBC
HCT: 23 % — ABNORMAL LOW (ref 36.0–46.0)
Hemoglobin: 7.3 g/dL — ABNORMAL LOW (ref 12.0–15.0)
MCH: 28.6 pg (ref 26.0–34.0)
MCHC: 31.7 g/dL (ref 30.0–36.0)
MCV: 90.2 fL (ref 78.0–100.0)
Platelets: 218 10*3/uL (ref 150–400)
RBC: 2.55 MIL/uL — ABNORMAL LOW (ref 3.87–5.11)
RDW: 13.9 % (ref 11.5–15.5)
WBC: 7.3 10*3/uL (ref 4.0–10.5)

## 2011-08-04 LAB — PREPARE RBC (CROSSMATCH)

## 2011-08-04 LAB — PROTIME-INR
INR: 1.78 — ABNORMAL HIGH (ref 0.00–1.49)
Prothrombin Time: 21 seconds — ABNORMAL HIGH (ref 11.6–15.2)

## 2011-08-04 MED ORDER — SODIUM CHLORIDE 0.9 % IJ SOLN
10.0000 mL | INTRAMUSCULAR | Status: DC | PRN
  Administered 2011-08-04 – 2011-08-07 (×6): 10 mL

## 2011-08-04 MED ORDER — WARFARIN - PHARMACIST DOSING INPATIENT: Freq: Every day | Status: DC

## 2011-08-04 MED ORDER — GABAPENTIN 300 MG PO CAPS
600.0000 mg | ORAL_CAPSULE | Freq: Four times a day (QID) | ORAL | Status: DC
  Administered 2011-08-04 – 2011-08-07 (×13): 600 mg via ORAL
  Filled 2011-08-04 (×17): qty 2

## 2011-08-04 MED ORDER — WARFARIN VIDEO
Freq: Once | Status: AC
  Administered 2011-08-05: 12:00:00

## 2011-08-04 MED ORDER — COUMADIN BOOK
Freq: Once | Status: AC
  Administered 2011-08-04: 14:00:00
  Filled 2011-08-04: qty 1

## 2011-08-04 MED ORDER — WARFARIN SODIUM 2.5 MG PO TABS
2.5000 mg | ORAL_TABLET | Freq: Once | ORAL | Status: AC
  Administered 2011-08-05: 2.5 mg via ORAL
  Filled 2011-08-04: qty 1

## 2011-08-04 MED ORDER — DIPHENHYDRAMINE HCL 25 MG PO CAPS
25.0000 mg | ORAL_CAPSULE | Freq: Once | ORAL | Status: AC
  Administered 2011-08-04: 25 mg via ORAL
  Filled 2011-08-04: qty 1

## 2011-08-04 MED ORDER — ACETAMINOPHEN 325 MG PO TABS
650.0000 mg | ORAL_TABLET | Freq: Once | ORAL | Status: AC
  Administered 2011-08-04: 650 mg via ORAL
  Filled 2011-08-04: qty 2

## 2011-08-04 MED ORDER — FUROSEMIDE 10 MG/ML IJ SOLN
20.0000 mg | Freq: Once | INTRAMUSCULAR | Status: AC
  Administered 2011-08-04: 20 mg via INTRAVENOUS
  Filled 2011-08-04 (×2): qty 2

## 2011-08-04 NOTE — Plan of Care (Signed)
Problem: Phase II Progression Outcomes Goal: Ambulates Outcome: Not Progressing Due to low BP and Hgb

## 2011-08-04 NOTE — Progress Notes (Signed)
OT Cancellation Note  Treatment cancelled today due to medical issues with patient which prohibited therapy: pt with hgb of 7.3 and is symptomatic stating, "I'm wiped out. Maybe I'll have more energy later, after I get that blood." Will check back as schedule allows. Thank you!  [name], OTR/L Pager: [phone] 08/04/2011    [name] 08/04/2011, 10:29 AM

## 2011-08-04 NOTE — Progress Notes (Signed)
08/04/11 1610 Nursing Dr Magnus Ivan called reg hgb 7.3 this am. Orders received to transfuse 2 units prbcs today

## 2011-08-04 NOTE — Op Note (Signed)
NAMEVIRIGINIA, [name]               ACCOUNT NO.:  [id number]  MEDICAL RECORD NO.:  [id number]  LOCATION:  1617                         FACILITY:  [facility]  PHYSICIAN:  Vanita Panda. Magnus Ivan, M.D.DATE OF BIRTH:  21-Nov-1945  DATE OF PROCEDURE:  08/03/2011 DATE OF DISCHARGE:                              OPERATIVE REPORT   PREOPERATIVE DIAGNOSES:  End-stage arthritis and degenerative joint disease, right hip.  POSTOPERATIVE DIAGNOSIS:  End-stage arthritis and degenerative joint disease, right hip.  PROCEDURE:  Right total hip arthroplasty through direct anterior approach.  IMPLANTS:  DePuy [employer] G[employer]ion acetabular component size 48, size 32+ 4 neutral polyethylene liner, Corail femoral component with standard offset, size 11, size 32+ 1 metal hip ball.  SURGEON:  Vanita Panda. Magnus Ivan, M.D.  ANESTHESIA:  Spinal.  BLOOD LOSS:  350 cc.  COMPLICATIONS:  None.  INDICATIONS:  [name] is a very pleasant 66 year old female with well- documented end-stage arthritis of her right hip.  This caused debilitating pain.  She is now on chronic pain medications.  As a result of this, has poor mobility as well as poor quality of life.  She wished to proceed with a total hip arthroplasty.  The risks and benefits of this have been explained to her in detail.  She does wish to proceed with surgery.  PROCEDURE DESCRIPTION:  After informed consent was obtained, appropriate right hip was marked.  She was brought to the operating room.  Spinal anesthesia was then obtained while she was on her stretcher.  She was then laid back supine and a Foley catheter was placed as well as traction boots were placed on her feet for placement on the Hana fracture table.  She was placed on the Hana fracture table supine with the perineal post in place and both legs were placed in in-line skeletal traction, but no traction applied.  Her right hip was then prepped and draped with DuraPrep and sterile drapes.   A time-out was called and she was identified as correct patient and correct right hip.  I then made an incision just distal and posterior to the anterior-superior iliac spine and carried this obliquely down the leg.  I dissected down to the tensor fascia lata and the tensor fascia was divided obliquely.  I proceeded with a direct anterior approach to the hip.  At that point, a Cobra retractor was placed around the lateral neck and then up underneath the rectus femoris, a Cobra retractor was placed medially.  I cauterized the lateral femoral circumflex vessels and then divided the hip capsule in a T-type format and put the Cobra retractors within the hip capsule.  I then made my femoral neck cut just proximal to the lesser trochanter.  I placed a corkscrew guide in the femoral head and removed the femoral head in its entirety.  It was found to have significant disease.  I then placed a Bent Hohmann medially to the acetabulum and a Cobra retractor laterally.  I was able to clean the acetabular debris as well as __________ labrum.  I then began reaming from a size 42 reamer in 2-mm increments up to a 48.  All reamers were placed under direct visualization with  the last reamer placed under direct fluoroscopy to obtain my depth of reaming as well as my inclination and anteversion.  I then placed a real size 48 acetabular component, which was a Public affairs consultant from [employer] and knocked this into place.  Again, this was under direct visualization and fluoroscopy.  It was nice, solid fit, so I placed the real 32+ 4 polyethylene liner.  Next, attention was turned to the femur.  The leg was externally rotated to 90 degrees, extended and adducted to allow access to the femoral canal.  I placed a Mueller retractor medially and a Hohmann underneath the greater trochanter.  I released the piriformis as well as the lateral capsule.  I then used a box cutting guide to gain access to the femoral canal and  then began broaching from a size 8 broach up to a size 11.  The 11 was felt to be stable.  I trialed a standard neck and a 32+ 1 hip ball.  We brought the leg back over and up and then full traction with internal rotation, I reduced the hip.  It was stable with internal and external rotation with minimal shuck.  Under direct fluoroscopy, her leg lengths were measured to be equal.  I then re-dislocated the hip and removed all trial components.  I placed the real Corail femoral component from DePuy, size 11 with standard offset and the real 32+ 1 metal hip ball.  I reduced this back in the acetabulum and it was stable.  I copiously irrigated the soft tissues with normal saline solution and closed the joint capsule with interrupted #1 Ethibond suture followed by a running V-Loc 0 suture in the tensor fascia lata, 2-0 Vicryl in subcutaneous tissue, and interrupted staples on the skin.  Well-padded sterile dressing was applied.  She was taken off of the Hana table into the recovery room in stable condition.  All final counts were correct.  There were no complications noted.     Vanita Panda. Magnus Ivan, M.D.     CYB/MEDQ  D:  08/03/2011  T:  08/04/2011  Job:  [id number]

## 2011-08-04 NOTE — Progress Notes (Signed)
Did not give notes on xarelto. Med dc'd and pt to start on Coumadin. Conleigh Heinlein, [employer]

## 2011-08-04 NOTE — Progress Notes (Signed)
Cm spoke with pt with multiple family members present in room concerning dc planning. Pt offered choice list for [facility]. 3N1 delivered from TNT present in room during interview. Pt informed referral made to [facility] from Md office. Wants to look at choice list for a day. Will continue to follow.  Leonie Green [phone]

## 2011-08-04 NOTE — Progress Notes (Signed)
ANTICOAGULATION CONSULT NOTE - Initial Consult  Pharmacy Consult for Warfarin Indication: VTE px s/p R THA  Allergies  Allergen Reactions  . Dabigatran Etexilate Mesylate Other (See Comments)    INTERNAL BLEEDING  . Pentazocine Lactate Other (See Comments)    HALLUCINATIONS  . Sulfonamide Derivatives Other (See Comments)    JITTERINESS     Patient Measurements: Height: 5\' 2"  (157.5 cm) Weight: 99 lb 6 oz (45.076 kg) IBW/kg (Calculated) : 50.1    Vital Signs: Temp: 97.9 F (36.6 C) (05/18 0555) Temp src: Oral (05/18 0555) BP: 109/68 mmHg (05/18 0555) Pulse Rate: 106  (05/18 0555)  Labs:  Basename 08/04/11 0325  HGB 7.3*  HCT 23.0*  PLT 218  APTT --  LABPROT --  INR --  HEPARINUNFRC --  CREATININE 0.87  CKTOTAL --  CKMB --  TROPONINI --    Estimated Creatinine Clearance: 45.3 ml/min (by C-G formula based on Cr of 0.87).   Medical History: Past Medical History  Diagnosis Date  . GERD (gastroesophageal reflux disease)   . CAD (coronary artery disease)     mild disease per cath in 2011  . LBBB (left bundle branch block)   . Atrial fibrillation     on amiodarone  . Venous embolism and thrombosis of subclavian vein     after pacemaker insertion Oct 2011  . Pleural effusion      s/p right thoracentesis 03/09  . Small bowel obstruction   . Urinary incontinence      -INTERSTIM IMPLANT NOT FUNCTIONING PER PT  . RLS (restless legs syndrome)   . Primary dilated cardiomyopathy     EF 45 to 50% per echo in Jan 2012  . Vitamin B12 deficiency   . Gastroparesis   . Chronic nausea   . Chronic abdominal pain   . Chronic pain syndrome   . Pacemaker     CRT therapy; followed by Dr. Graciela Husbands  . H/O: GI bleed     from Pradaxa  . Oxygen dependent     2 liters via nasal cannula at all times  . CHF (congestive heart failure)   . Anemia   . Hx of stress fx aug 2011    right hip   . Elbow fracture, left aug 2012  . Arthritis     right hip  . Hx of gastric ulcer     . Diverticulitis   . HTN (hypertension)     under control; has been on med. x "years"  . COPD (chronic obstructive pulmonary disease)     continuous O2  . Hx MRSA infection   . Pneumonia - 03/09, 12/09, 02/10, 06/10    MOST RECENT FEB 2013  . Fibromyalgia     COUPLE OF TIMES A YEAR-PT HAS EPISODES OF CONFUSION-USUALLY INVOLVES DAY/NIGHT REVERSAL AND EPISODES OF TWITCHING AND FALLS--SEES DR. Modesto Charon - NEUROLOGIST-LAST SEEN 07/10/11  . Elevated liver enzymes   . Blood transfusion     Medications:  Scheduled:    . acetaminophen  650 mg Oral Once  . amiodarone  200 mg Oral Daily  . carvedilol  6.25 mg Oral BID WC  .  ceFAZolin (ANCEF) IV  1 g Intravenous Q6H  . diphenhydrAMINE  25 mg Oral Once  . docusate sodium  100 mg Oral BID  . fentaNYL  50 mcg Transdermal Q72H  . furosemide  20 mg Intravenous Once  . furosemide  20 mg Oral Daily  . gabapentin  600 mg Oral QID  . morphine  Intravenous Q4H  . pantoprazole  40 mg Oral Q1200  . tiotropium  18 mcg Inhalation Daily  . DISCONTD: gabapentin  600 mg Oral QID  . DISCONTD: rivaroxaban  10 mg Oral Q breakfast   Infusions:    . sodium chloride 50 mL/hr at 08/04/11 0343  . DISCONTD: lactated ringers 20 mL/hr at 08/03/11 1150  . DISCONTD: lactated ringers     PRN: acetaminophen, acetaminophen, albuterol, alum & mag hydroxide-simeth, cyclobenzaprine, diphenhydrAMINE, diphenhydrAMINE, diphenhydrAMINE, HYDROcodone-acetaminophen, menthol-cetylpyridinium, metoCLOPramide (REGLAN) injection, metoCLOPramide, morphine injection, naloxone, nitroGLYCERIN, ondansetron (ZOFRAN) IV, ondansetron (ZOFRAN) IV, ondansetron, oxyCODONE, phenol, sodium chloride, sodium chloride, temazepam, DISCONTD: 0.9 % irrigation (POUR BTL) DISCONTD:  HYDROmorphone (DILAUDID) injection, DISCONTD: meperidine (DEMEROL) injection, DISCONTD: ondansetron (ZOFRAN) IV  Assessment:  66 yo F s/p R THA on 5/17 to start coumadin for vte prophylaxis  Patient was initially started  on xarelto, however pt. Has increased risk of bleeding on xarelto with a crcl 30-50 and also on amiodarone.  Today Hgb was 7.3, discussed with Dr. Magnus Ivan and will switch patient to coumadin.   Xarelto dose given today, will start dosing coumadin in AM  Baseline INR prior to xarelto was 0.91, will repeat today given xarelto can affect INR and dose given today.   *Coumadin Drug interaction with amiodarone* Will monitor  Patient's weight is 45 kg - will dose coumadin conservatively  Goal of Therapy:  INR 2-3 Monitor platelets by anticoagulation protocol: Yes   Plan:  1.) Discontinue xarelto 2.) Baseline INR today 3.) Coumadin 2.5 mg in the morning 4.) Daily PT/INR, monitor CBC 5.) Coumadin book/video/education   Louretta Tantillo, [name] PharmD 11:16 AM 08/04/2011

## 2011-08-04 NOTE — Evaluation (Signed)
Physical Therapy Evaluation Patient Details Name: [name] MRN: [id number] DOB: [birth year] Today's Date: 08/04/2011 Time: 4098-1191 PT Time Calculation (min): 12 min  PT Assessment / Plan / Recommendation Clinical Impression  66 y.o. female who is POD#1 for R direct anterior THA. Pt has low Hgb and low BP so only bed exercises were performed per RN request. Will initiate transfers and ambulation tomorrow. Encouraged pt to get OOB with nursing later tonight after she's received 2nd unit of blood.     PT Assessment  Patient needs continued PT services    Follow Up Recommendations  Home health PT    Barriers to Discharge None      lEquipment Recommendations  None recommended by PT    Recommendations for Other Services OT consult   Frequency 7X/week    Precautions / Restrictions Precautions Precautions: None Restrictions Weight Bearing Restrictions: No Other Position/Activity Restrictions: WBAT   Pertinent Vitals/Pain **7/10 R hip with exercises; pt premedicated, ice applied*      Mobility       Exercises Total Joint Exercises Ankle Circles/Pumps: AROM;Both;10 reps;Supine Quad Sets: AROM;Both;10 reps;Supine Short Arc Quad: AROM;Right;10 reps;Supine Heel Slides: AROM;Right;10 reps;Supine Hip ABduction/ADduction: AROM;Right;10 reps;Supine   PT Diagnosis: Acute pain;Difficulty walking  PT Problem List: Decreased strength;Pain;Decreased mobility;Decreased range of motion PT Treatment Interventions: DME instruction;Gait training;Stair training;Functional mobility training;Therapeutic activities;Therapeutic exercise;Patient/family education   PT Goals Acute Rehab PT Goals PT Goal Formulation: With patient Time For Goal Achievement: 08/07/11 Potential to Achieve Goals: Good Pt will go Supine/Side to Sit: with supervision PT Goal: Supine/Side to Sit - Progress: Goal set today Pt will go Sit to Stand: with modified independence PT Goal: Sit to Stand - Progress: Goal  set today Pt will Ambulate: 51 - 150 feet PT Goal: Ambulate - Progress: Goal set today Pt will Go Up / Down Stairs: 1-2 stairs PT Goal: Up/Down Stairs - Progress: Goal set today Pt will Perform Home Exercise Program: with min assist PT Goal: Perform Home Exercise Program - Progress: Goal set today  Visit Information  Last PT Received On: 08/04/11 Assistance Needed: +1 Reason Eval/Treat Not Completed: Medical issues which prohibited therapy    Subjective Data  Subjective: I'm sure my hair is a mess. Patient Stated Goal: be able to walk   Prior Functioning  Home Living Lives With: Spouse Available Help at Discharge: Family Home Access: Stairs to enter Secretary/administrator of Steps: 1 Entrance Stairs-Rails: None Home Layout: One level Home Adaptive Equipment: Environmental consultant - rolling;Straight cane Prior Function Level of Independence: Independent with assistive device(s) Able to Take Stairs?: Yes Communication Communication: No difficulties    Cognition  Overall Cognitive Status: Appears within functional limits for tasks assessed/performed Arousal/Alertness: Awake/alert Orientation Level: Appears intact for tasks assessed Behavior During Session: [facility] for tasks performed    Extremity/Trunk Assessment Right Lower Extremity Assessment RLE ROM/Strength/Tone: Due to pain;Deficits RLE ROM/Strength/Tone Deficits: Hip ABD AROM 15* during ther ex, limited by pain. Hip strength 2/5 grossly. Ankle 5/5. Knee/ankle AROM WNL. RLE Sensation: WFL - Light Touch Left Lower Extremity Assessment LLE ROM/Strength/Tone: Within functional levels LLE Sensation: WFL - Light Touch LLE Coordination: WFL - gross/fine motor   Balance    End of Session PT - End of Session Activity Tolerance: Patient limited by pain Patient left: in bed   Ralene Bathe Kistler 08/04/2011, 1:56 PM  [phone]

## 2011-08-04 NOTE — Progress Notes (Signed)
Subjective: 1 Day Post-Op Procedure(s) (LRB): TOTAL HIP ARTHROPLASTY ANTERIOR APPROACH (Right) Patient reports pain as moderate.  Acute blood loss anemia.  Objective: Vital signs in last 24 hours: Temp:  [97.3 F (36.3 C)-100 F (37.8 C)] 97.9 F (36.6 C) (05/18 0555) Pulse Rate:  [69-106] 106  (05/18 0555) Resp:  [7-16] 13  (05/18 0908) BP: (86-109)/(53-68) 109/68 mmHg (05/18 0555) SpO2:  [93 %-100 %] 96 % (05/18 0908) Weight:  [45.076 kg (99 lb 6 oz)] 45.076 kg (99 lb 6 oz) (05/17 1235)  Intake/Output from previous day: 05/17 0701 - 05/18 0700 In: 3280 [P.O.:680; I.V.:2000; IV Piggyback:600] Out: 3025 [Urine:2675; Blood:350] Intake/Output this shift:     Basename 08/04/11 0325  HGB 7.3*    Basename 08/04/11 0325  WBC 7.3  RBC 2.55*  HCT 23.0*  PLT 218    Basename 08/04/11 0325  NA 134*  K 3.9  CL 102  CO2 27  BUN 15  CREATININE 0.87  GLUCOSE 90  CALCIUM 6.6*   No results found for this basename: LABPT:2,INR:2 in the last 72 hours  Sensation intact distally Intact pulses distally Dorsiflexion/Plantar flexion intact Incision: no drainage  Assessment/Plan: 1 Day Post-Op Procedure(s) (LRB): TOTAL HIP ARTHROPLASTY ANTERIOR APPROACH (Right) Up with therapy Transfuse 2 units PRBCs. Leave foley in today to monitor urine out put given drop in hgb.  Jerelle Virden Y 08/04/2011, 10:50 AM

## 2011-08-05 LAB — CBC
HCT: 33 % — ABNORMAL LOW (ref 36.0–46.0)
Hemoglobin: 11 g/dL — ABNORMAL LOW (ref 12.0–15.0)
MCH: 29 pg (ref 26.0–34.0)
MCHC: 33.3 g/dL (ref 30.0–36.0)
MCV: 87.1 fL (ref 78.0–100.0)
Platelets: 207 10*3/uL (ref 150–400)
RBC: 3.79 MIL/uL — ABNORMAL LOW (ref 3.87–5.11)
RDW: 15.4 % (ref 11.5–15.5)
WBC: 10.4 10*3/uL (ref 4.0–10.5)

## 2011-08-05 LAB — TYPE AND SCREEN
ABO/RH(D): A NEG
Antibody Screen: NEGATIVE
Unit division: 0
Unit division: 0

## 2011-08-05 LAB — PROTIME-INR
INR: 1.66 — ABNORMAL HIGH (ref 0.00–1.49)
Prothrombin Time: 19.9 seconds — ABNORMAL HIGH (ref 11.6–15.2)

## 2011-08-05 NOTE — Progress Notes (Signed)
ANTICOAGULATION CONSULT NOTE - Initial Consult  Pharmacy Consult for Warfarin Indication: VTE px s/p R THA  Allergies  Allergen Reactions  . Dabigatran Etexilate Mesylate Other (See Comments)    INTERNAL BLEEDING  . Pentazocine Lactate Other (See Comments)    HALLUCINATIONS  . Sulfonamide Derivatives Other (See Comments)    JITTERINESS     Patient Measurements: Height: 5\' 2"  (157.5 cm) Weight: 99 lb 6 oz (45.076 kg) IBW/kg (Calculated) : 50.1    Vital Signs: Temp: 99.3 F (37.4 C) (05/19 0510) Temp src: Oral (05/19 0510) BP: 120/77 mmHg (05/19 0510) Pulse Rate: 105  (05/19 0510)  Labs:  Basename 08/05/11 0425 08/04/11 2200 08/04/11 0325  HGB 11.0* -- 7.3*  HCT 33.0* -- 23.0*  PLT 207 -- 218  APTT -- -- --  LABPROT 19.9* 21.0* --  INR 1.66* 1.78* --  HEPARINUNFRC -- -- --  CREATININE -- -- 0.87  CKTOTAL -- -- --  CKMB -- -- --  TROPONINI -- -- --    Estimated Creatinine Clearance: 45.3 ml/min (by C-G formula based on Cr of 0.87).   Medical History: Past Medical History  Diagnosis Date  . GERD (gastroesophageal reflux disease)   . CAD (coronary artery disease)     mild disease per cath in 2011  . LBBB (left bundle branch block)   . Atrial fibrillation     on amiodarone  . Venous embolism and thrombosis of subclavian vein     after pacemaker insertion Oct 2011  . Pleural effusion      s/p right thoracentesis 03/09  . Small bowel obstruction   . Urinary incontinence      -INTERSTIM IMPLANT NOT FUNCTIONING PER PT  . RLS (restless legs syndrome)   . Primary dilated cardiomyopathy     EF 45 to 50% per echo in Jan 2012  . Vitamin B12 deficiency   . Gastroparesis   . Chronic nausea   . Chronic abdominal pain   . Chronic pain syndrome   . Pacemaker     CRT therapy; followed by Dr. Graciela Husbands  . H/O: GI bleed     from Pradaxa  . Oxygen dependent     2 liters via nasal cannula at all times  . CHF (congestive heart failure)   . Anemia   . Hx of stress fx  aug 2011    right hip   . Elbow fracture, left aug 2012  . Arthritis     right hip  . Hx of gastric ulcer   . Diverticulitis   . HTN (hypertension)     under control; has been on med. x "years"  . COPD (chronic obstructive pulmonary disease)     continuous O2  . Hx MRSA infection   . Pneumonia - 03/09, 12/09, 02/10, 06/10    MOST RECENT FEB 2013  . Fibromyalgia     COUPLE OF TIMES A YEAR-PT HAS EPISODES OF CONFUSION-USUALLY INVOLVES DAY/NIGHT REVERSAL AND EPISODES OF TWITCHING AND FALLS--SEES DR. Modesto Charon - NEUROLOGIST-LAST SEEN 07/10/11  . Elevated liver enzymes   . Blood transfusion     Medications:  Scheduled:     . acetaminophen  650 mg Oral Once  . amiodarone  200 mg Oral Daily  . carvedilol  6.25 mg Oral BID WC  . coumadin book   Does not apply Once  . diphenhydrAMINE  25 mg Oral Once  . docusate sodium  100 mg Oral BID  . fentaNYL  50 mcg Transdermal Q72H  . furosemide  20 mg Intravenous Once  . furosemide  20 mg Oral Daily  . gabapentin  600 mg Oral QID  . morphine   Intravenous Q4H  . pantoprazole  40 mg Oral Q1200  . tiotropium  18 mcg Inhalation Daily  . warfarin  2.5 mg Oral Once  . warfarin   Does not apply Once  . Warfarin - Pharmacist Dosing Inpatient   Does not apply q1800  . DISCONTD: rivaroxaban  10 mg Oral Q breakfast   Infusions:     . sodium chloride 50 mL/hr at 08/04/11 2352   PRN: acetaminophen, acetaminophen, albuterol, alum & mag hydroxide-simeth, cyclobenzaprine, diphenhydrAMINE, diphenhydrAMINE, diphenhydrAMINE, HYDROcodone-acetaminophen, menthol-cetylpyridinium, metoCLOPramide (REGLAN) injection, metoCLOPramide, morphine injection, naloxone, nitroGLYCERIN, ondansetron (ZOFRAN) IV, ondansetron (ZOFRAN) IV, ondansetron, oxyCODONE, phenol, sodium chloride, sodium chloride, temazepam  Assessment:  66 yo F s/p R THA on 5/17 to start coumadin for vte prophylaxis  Patient was initially started on xarelto, however pt. Has increased risk of bleeding  on xarelto with a crcl 30-50 and also on amiodarone. Hgb improved today, continue with coumadin dose ordered.    Baseline INR prior to xarelto was 0.91, after xarelto dose INR increased quite a bit but remains < 2.     *Coumadin Drug interaction with amiodarone* Will monitor  Patient's weight is 45 kg - will dose coumadin conservatively  Goal of Therapy:  INR 2-3 Monitor platelets by anticoagulation protocol: Yes   Plan:  1.) proceed with Coumadin 2.5 mg this morning 2.) Follow daily INR  Jaely Silman, [name] PharmD 7:28 AM 08/05/2011

## 2011-08-05 NOTE — Progress Notes (Signed)
Cm spoke with pt concerning dc planning. Per pt choice [facility] to provide [facility] services. Pt informed Md office refferd pt to [facility], pt stated [facility] has provided care in past, wants to choose [facility] to provide HHPT. [facility] notified of referral. DME delivered prior to interview. [name] to assist in home care.   [name] [phone]

## 2011-08-05 NOTE — Progress Notes (Signed)
Physical Therapy Treatment Patient Details Name: [name] MRN: [id number] DOB: 1945-10-27 Today's Date: 08/05/2011 Time: 2952-8413 PT Time Calculation (min): 10 min  PT Assessment / Plan / Recommendation Comments on Treatment Session  Pt did well with transfers and ambulation today, pt walked with 2L O2, she is O2 dependent. Progressing well. Pain limits activity tolerance.     Follow Up Recommendations  Home health PT    Barriers to Discharge        Equipment Recommendations  None recommended by PT    Recommendations for Other Services OT consult  Frequency 7X/week   Plan Discharge plan remains appropriate    Precautions / Restrictions Precautions Precautions: None Precaution Comments: direct anterior THA Restrictions Other Position/Activity Restrictions: WBAT   Pertinent Vitals/Pain *6/10 R hip; ice applied, pt using PCA**    Mobility  Bed Mobility Bed Mobility: Sit to Supine Supine to Sit: 4: Min assist;HOB elevated Sit to Supine: 4: Min assist Details for Bed Mobility Assistance: min assist to support LEs Transfers Transfers: Sit to Stand;Stand to Sit Sit to Stand: 4: Min assist;From chair/3-in-1;With upper extremity assist Stand to Sit: 4: Min assist;To bed Stand Pivot Transfers: 4: Min assist Details for Transfer Assistance: VCs for hand placement Ambulation/Gait Ambulation/Gait Assistance: 4: Min guard Ambulation Distance (Feet): 6 Feet Assistive device: Rolling walker Ambulation/Gait Assistance Details: VCs sequencing and for flexed neck Gait Pattern: Step-to pattern;Decreased step length - right    Exercises Total Joint Exercises Ankle Circles/Pumps: AROM;Both;10 reps;Supine Quad Sets: AROM;Both;10 reps;Supine Short Arc Quad: AAROM;Right;10 reps;Seated Heel Slides: AROM;Right;10 reps;Supine Hip ABduction/ADduction: AROM;Right;10 reps;Supine   PT Diagnosis:    PT Problem List:   PT Treatment Interventions:     PT Goals Acute Rehab PT  Goals PT Goal Formulation: With patient Time For Goal Achievement: 08/07/11 Potential to Achieve Goals: Good Pt will go Supine/Side to Sit: with supervision PT Goal: Supine/Side to Sit - Progress: Progressing toward goal Pt will go Sit to Stand: with modified independence PT Goal: Sit to Stand - Progress: Progressing toward goal Pt will Ambulate: 51 - 150 feet PT Goal: Ambulate - Progress: Progressing toward goal Pt will Go Up / Down Stairs: 1-2 stairs PT Goal: Up/Down Stairs - Progress: Not met Pt will Perform Home Exercise Program: with min assist PT Goal: Perform Home Exercise Program - Progress: Progressing toward goal  Visit Information  Last PT Received On: 08/05/11 Assistance Needed: +1    Subjective Data  Subjective: I'm ready to get back in bed. Patient Stated Goal: be able to walk   Cognition  Overall Cognitive Status: Appears within functional limits for tasks assessed/performed Arousal/Alertness: Awake/alert Orientation Level: Appears intact for tasks assessed Behavior During Session: [facility] for tasks performed    Balance     End of Session PT - End of Session Activity Tolerance: Patient limited by pain Patient left: with call bell/phone within reach;in bed Nurse Communication: Mobility status    Tamala Ser 08/05/2011, 12:29 PM [phone]

## 2011-08-05 NOTE — Progress Notes (Signed)
Subjective: 2 Days Post-Op Procedure(s) (LRB): TOTAL HIP ARTHROPLASTY ANTERIOR APPROACH (Right) Patient reports pain as severe.  Mobility limited by chronic pain.  Responded well with transfusion yesterday.  Objective: Vital signs in last 24 hours: Temp:  [99.1 F (37.3 C)-100.6 F (38.1 C)] 99.3 F (37.4 C) (05/19 0510) Pulse Rate:  [86-105] 105  (05/19 0510) Resp:  [12-21] 16  (05/19 0738) BP: (82-120)/(52-77) 120/77 mmHg (05/19 0510) SpO2:  [92 %-97 %] 92 % (05/19 0738)  Intake/Output from previous day: 05/18 0701 - 05/19 0700 In: 2301.3 [P.O.:480; I.V.:1128.8; Blood:692.5] Out: 1725 [Urine:1725] Intake/Output this shift: Total I/O In: 120 [P.O.:120] Out: -    Basename 08/05/11 0425 08/04/11 0325  HGB 11.0* 7.3*    Basename 08/05/11 0425 08/04/11 0325  WBC 10.4 7.3  RBC 3.79* 2.55*  HCT 33.0* 23.0*  PLT 207 218    Basename 08/04/11 0325  NA 134*  K 3.9  CL 102  CO2 27  BUN 15  CREATININE 0.87  GLUCOSE 90  CALCIUM 6.6*    Basename 08/05/11 0425 08/04/11 2200  LABPT -- --  INR 1.66* 1.78*    Sensation intact distally Intact pulses distally Dorsiflexion/Plantar flexion intact Incision: dressing C/D/I  Assessment/Plan: 2 Days Post-Op Procedure(s) (LRB): TOTAL HIP ARTHROPLASTY ANTERIOR APPROACH (Right) Up with therapy Very slow mobility limited by chronic pain and weakness.  May need ST-SNF placement.  Lylith Bebeau Y 08/05/2011, 9:28 AM

## 2011-08-05 NOTE — Progress Notes (Signed)
Physical Therapy Treatment Patient Details Name: [name] MRN: [id number] DOB: 06-05-1945 Today's Date: 08/05/2011 Time: 4696-2952 PT Time Calculation (min): 43 min  PT Assessment / Plan / Recommendation Comments on Treatment Session  Pt did well with transfers and ambulation today, pt walked with 2L O2, she is O2 dependent. Progressing well. Pain limits activity tolerance.     Follow Up Recommendations  Home health PT    Barriers to Discharge        Equipment Recommendations  None recommended by PT    Recommendations for Other Services OT consult  Frequency 7X/week   Plan Discharge plan remains appropriate    Precautions / Restrictions Precautions Precautions: None Precaution Comments: direct anterior THA Restrictions Weight Bearing Restrictions: No Other Position/Activity Restrictions: WBAT   Pertinent Vitals/Pain *8/10 R hip with activity; ice applied, pt using PCA**    Mobility  Bed Mobility Bed Mobility: Supine to Sit Supine to Sit: 4: Min assist;HOB elevated Details for Bed Mobility Assistance: min A for RLE, pt 75% Transfers Transfers: Sit to Stand;Stand to [employer] Transfers Sit to Stand: 4: Min assist;From bed;From chair/3-in-1;With upper extremity assist Stand to Sit: To chair/3-in-1;With upper extremity assist;With armrests Stand Pivot Transfers: 4: Min assist Details for Transfer Assistance: VCs hand placement Ambulation/Gait Ambulation/Gait Assistance: 4: Min assist Ambulation Distance (Feet): 20 Feet Assistive device: Rolling walker Ambulation/Gait Assistance Details: VCs sequencing and for flexed neck Gait Pattern: Step-to pattern;Decreased step length - left;Decreased step length - right    Exercises Total Joint Exercises Ankle Circles/Pumps: AROM;Both;10 reps;Supine Quad Sets: AROM;Both;10 reps;Supine Short Arc Quad: AAROM;Right;10 reps;Seated Heel Slides: AROM;Right;10 reps;Supine Hip ABduction/ADduction: AROM;Right;10 reps;Supine     PT Diagnosis:    PT Problem List:   PT Treatment Interventions:     PT Goals Acute Rehab PT Goals PT Goal Formulation: With patient Time For Goal Achievement: 08/07/11 Potential to Achieve Goals: Good Pt will go Supine/Side to Sit: with supervision PT Goal: Supine/Side to Sit - Progress: Progressing toward goal Pt will go Sit to Stand: with modified independence PT Goal: Sit to Stand - Progress: Progressing toward goal Pt will Ambulate: 51 - 150 feet PT Goal: Ambulate - Progress: Progressing toward goal Pt will Go Up / Down Stairs: 1-2 stairs PT Goal: Up/Down Stairs - Progress: Not met Pt will Perform Home Exercise Program: with min assist PT Goal: Perform Home Exercise Program - Progress: Progressing toward goal  Visit Information  Last PT Received On: 08/05/11 Assistance Needed: +1    Subjective Data  Subjective: The more I move my leg the better it will get. Patient Stated Goal: return to PLOF   Cognition  Overall Cognitive Status: Appears within functional limits for tasks assessed/performed Arousal/Alertness: Awake/alert Orientation Level: Appears intact for tasks assessed Behavior During Session: [facility] for tasks performed    Balance     End of Session PT - End of Session Activity Tolerance: Patient limited by pain Patient left: in chair;with call bell/phone within reach Nurse Communication: Mobility status    Tamala Ser 08/05/2011, 9:58 AM [phone]

## 2011-08-06 ENCOUNTER — Encounter (HOSPITAL_COMMUNITY): Payer: Self-pay | Admitting: Orthopaedic Surgery

## 2011-08-06 LAB — CBC
HCT: 31.2 % — ABNORMAL LOW (ref 36.0–46.0)
Hemoglobin: 10.3 g/dL — ABNORMAL LOW (ref 12.0–15.0)
MCH: 29.3 pg (ref 26.0–34.0)
MCHC: 33 g/dL (ref 30.0–36.0)
MCV: 88.6 fL (ref 78.0–100.0)
Platelets: 205 10*3/uL (ref 150–400)
RBC: 3.52 MIL/uL — ABNORMAL LOW (ref 3.87–5.11)
RDW: 15.1 % (ref 11.5–15.5)
WBC: 9 10*3/uL (ref 4.0–10.5)

## 2011-08-06 LAB — PROTIME-INR
INR: 1.22 (ref 0.00–1.49)
Prothrombin Time: 15.7 seconds — ABNORMAL HIGH (ref 11.6–15.2)

## 2011-08-06 MED ORDER — WARFARIN SODIUM 2.5 MG PO TABS
2.5000 mg | ORAL_TABLET | Freq: Once | ORAL | Status: AC
  Administered 2011-08-06: 2.5 mg via ORAL
  Filled 2011-08-06: qty 1

## 2011-08-06 MED ORDER — SODIUM CHLORIDE 0.9 % IV SOLN
INTRAVENOUS | Status: DC
  Administered 2011-08-06: 22:00:00 via INTRAVENOUS

## 2011-08-06 NOTE — Progress Notes (Signed)
Physical Therapy Treatment Patient Details Name: [name] MRN: [id number] DOB: Oct 21, 1945 Today's Date: 08/06/2011 Time: 4098-1191 PT Time Calculation (min): 25 min  PT Assessment / Plan / Recommendation Comments on Treatment Session  Assisted pt OOB to amb to BR to void, then amb in hallway 2nd time today.  Pt states, "It feels good to get out of that bed."    Follow Up Recommendations  Home health PT    Barriers to Discharge        Equipment Recommendations  3 in 1 bedside comode;Other (comment) (advised pt she should be okay to use her 4WW that she alread)    Recommendations for Other Services    Frequency 7X/week   Plan Discharge plan remains appropriate    Precautions / Restrictions   NONE Direct Anterior Approach   Pertinent Vitals/Pain C/o swelling, ICE pack    Mobility  Bed Mobility Bed Mobility: Sit to Supine;Supine to Sit Supine to Sit: 4: Min guard Sit to Supine: 4: Min assist Transfers Transfers: Sit to Stand;Stand to Sit Sit to Stand: 4: Min guard;From bed;With upper extremity assist Stand to Sit: 4: Min assist Details for Transfer Assistance: 50% VC's on hand placement to push up from behind vs pull self up using RW. Ambulation/Gait Ambulation/Gait Assistance: 4: Min guard Ambulation Distance (Feet): 150 Feet Assistive device: Rolling walker Ambulation/Gait Assistance Details: On 2 lts O2, sats avg 93% with DOE 2/4. Gait Pattern: Step-through pattern;Decreased stance time - right Step through    PT Goals Acute Rehab PT Goals PT Goal Formulation: With patient Potential to Achieve Goals: Good Pt will go Supine/Side to Sit: with supervision PT Goal: Supine/Side to Sit - Progress: Progressing toward goal Pt will go Sit to Stand: with modified independence PT Goal: Sit to Stand - Progress: Progressing toward goal Pt will Ambulate: 51 - 150 feet;with supervision;with least restrictive assistive device PT Goal: Ambulate - Progress: Progressing  toward goal  Visit Information  Last PT Received On: 08/06/11 Assistance Needed: +1    Subjective Data  Subjective: It feels good to get out of that bed Patient Stated Goal: going home tomorrow   Cognition    good   Balance   good with AD  End of Session PT - End of Session Equipment Utilized During Treatment: Gait belt Activity Tolerance: Patient tolerated treatment well Patient left: in bed;with call bell/phone within reach;with family/visitor present (spouse)    Felecia Shelling  PTA [facility]  Acute  Rehab Pager     [phone]

## 2011-08-06 NOTE — Evaluation (Signed)
Occupational Therapy Evaluation Patient Details Name: [name] MRN: [id number] DOB: [birth year] Today's Date: 08/06/2011 Time: 4098-1191 OT Time Calculation (min): 33 min  OT Assessment / Plan / Recommendation Clinical Impression  66 y.o. female with a  R direct anterior THA. She presents with decreased strength, increased pain and overall decrease in independence with ADL. Will benefit from skilled OT services to improver her safety and independence with these tasks.    OT Assessment  Patient needs continued OT Services    Follow Up Recommendations  No OT follow up    Barriers to Discharge      Equipment Recommendations  3 in 1 bedside comode;Other (comment) (pt has a 4 wheel walker, not a RW. Will defer to PT)    Recommendations for Other Services    Frequency  Min 2X/week    Precautions / Restrictions Precautions Precautions: None Precaution Comments: direct anterior THA Restrictions Weight Bearing Restrictions: No        ADL  Eating/Feeding: Simulated;Independent Where Assessed - Eating/Feeding: Bed level Grooming: Simulated;Wash/dry hands;Set up Where Assessed - Grooming: Supported sitting Upper Body Bathing: Simulated;Chest;Right arm;Left arm;Abdomen;Set up Where Assessed - Upper Body Bathing: Unsupported sitting Lower Body Bathing: Simulated;Minimal assistance Where Assessed - Lower Body Bathing: Supported sit to stand Upper Body Dressing: Simulated;Set up Where Assessed - Upper Body Dressing: Unsupported sitting Lower Body Dressing: Simulated;Minimal assistance Where Assessed - Lower Body Dressing: Sopported sit to stand Toilet Transfer: Performed;Minimal assistance Toilet Transfer Method: Stand pivot Toilet Transfer Equipment: Bedside commode Toileting - Clothing Manipulation and Hygiene: Performed;Minimal assistance Where Assessed - Engineer, mining and Hygiene: Standing Tub/Shower Transfer Method: Not assessed Equipment Used: Rolling  walker ADL Comments: Demonstrated AE briefly. Husband can assist. 3in1 delivered to room already. Pt on oxygen at home.  Pt just alittle dizzy with first sitting up to EOB. Dizziness subsided after approximately 10 seconds.   OT Diagnosis: Generalized weakness  OT Problem List: Decreased strength;Decreased knowledge of use of DME or AE;Pain OT Treatment Interventions: Self-care/ADL training;Therapeutic activities;DME and/or AE instruction;Patient/family education   OT Goals Acute Rehab OT Goals OT Goal Formulation: With patient Time For Goal Achievement: 08/13/11 Potential to Achieve Goals: Good ADL Goals Pt Will Perform Grooming: with supervision;Standing at sink ADL Goal: Grooming - Progress: Goal set today Pt Will Transfer to Toilet: with supervision;with DME;3-in-1;Ambulation ADL Goal: Toilet Transfer - Progress: Goal set today Pt Will Perform Tub/Shower Transfer: with min assist;Shower transfer;with DME ADL Goal: Tub/Shower Transfer - Progress: Goal set today  Visit Information  Last OT Received On: 08/06/11 Assistance Needed: +1    Subjective Data  Subjective: I got up to the chair yesterday Patient Stated Goal: none stated independently. agreeable to OT   Prior Functioning  Home Living Lives With: Spouse Available Help at Discharge: Family Type of Home: House Home Access: Stairs to enter [employer] of Steps: 1 Entrance Stairs-Rails: None Home Layout: One level Bathroom Shower/Tub: Tub/shower unit;Walk-in shower Bathroom Toilet: Standard Home Adaptive Equipment: Straight cane;Walker - four wheeled;Shower chair with back Prior Function Level of Independence: Independent with assistive device(s) Able to Take Stairs?: Yes Communication Communication: No difficulties    Cognition  Overall Cognitive Status: Appears within functional limits for tasks assessed/performed Arousal/Alertness: Awake/alert Orientation Level: Appears intact for tasks  assessed Behavior During Session: [facility] for tasks performed    Extremity/Trunk Assessment Right Upper Extremity Assessment RUE ROM/Strength/Tone: Within functional levels Left Upper Extremity Assessment LUE ROM/Strength/Tone: Within functional levels   Mobility Bed Mobility Bed Mobility: Supine to  Sit Supine to Sit: HOB elevated;4: Min assist Details for Bed Mobility Assistance: min A for LE support Transfers Transfers: Sit to Stand;Stand to Sit Sit to Stand: 4: Min assist;With upper extremity assist Stand to Sit: 4: Min assist;With upper extremity assist Details for Transfer Assistance: min verbal cues for hand placement   Exercise    Balance Balance Balance Assessed: Yes Dynamic Standing Balance Dynamic Standing - Level of Assistance: 4: Min assist;Other (comment) (pull up Depends)  End of Session OT - End of Session Activity Tolerance: Patient tolerated treatment well Patient left: in chair;with call bell/phone within reach   Lennox Laity [phone] 08/06/2011, 11:31 AM

## 2011-08-06 NOTE — Progress Notes (Signed)
ANTICOAGULATION CONSULT NOTE - Follow up Consult  Pharmacy Consult for Warfarin Indication: VTE ppx s/p R THA  Allergies  Allergen Reactions  . Dabigatran Etexilate Mesylate Other (See Comments)    INTERNAL BLEEDING  . Pentazocine Lactate Other (See Comments)    HALLUCINATIONS  . Sulfonamide Derivatives Other (See Comments)    JITTERINESS     Patient Measurements: Height: 5\' 2"  (157.5 cm) Weight: 99 lb 6 oz (45.076 kg) IBW/kg (Calculated) : 50.1   Vital Signs: Temp: 98.1 F (36.7 C) (05/20 0600) Temp src: Oral (05/20 0600) BP: 96/61 mmHg (05/20 0910) Pulse Rate: 80  (05/20 0910)  Labs:  Basename 08/06/11 0545 08/05/11 0425 08/04/11 2200 08/04/11 0325  HGB 10.3* 11.0* -- --  HCT 31.2* 33.0* -- 23.0*  PLT 205 207 -- 218  APTT -- -- -- --  LABPROT 15.7* 19.9* 21.0* --  INR 1.22 1.66* 1.78* --  HEPARINUNFRC -- -- -- --  CREATININE -- -- -- 0.87  CKTOTAL -- -- -- --  CKMB -- -- -- --  TROPONINI -- -- -- --    Estimated Creatinine Clearance: 45.3 ml/min (by C-G formula based on Cr of 0.87).  Medications:  Scheduled:     . amiodarone  200 mg Oral Daily  . carvedilol  6.25 mg Oral BID WC  . docusate sodium  100 mg Oral BID  . fentaNYL  50 mcg Transdermal Q72H  . furosemide  20 mg Oral Daily  . gabapentin  600 mg Oral QID  . pantoprazole  40 mg Oral Q1200  . tiotropium  18 mcg Inhalation Daily  . Warfarin - Pharmacist Dosing Inpatient   Does not apply q1800  . DISCONTD: morphine   Intravenous Q4H   Infusions:     . sodium chloride 20 mL/hr at 08/06/11 0645  . DISCONTD: sodium chloride 50 mL/hr at 08/05/11 2039   Inpatient Warfarin Doses: 5/19 2.5mg   Assessment:  66 yo F s/p R THA on 5/17 with warfarin per pharmacy for VTE prophylaxis  Patient was initially started on Xarelto 5/18, however pt. has increased risk of bleeding on Xarelto with a Crcl 30-50 and also on amiodarone.  CBC ok, no reports of bleeding or complications  INR subtherapeutic; pt  has only had 1 dose of warfarin, and INR is decreasing since Xarelto was d/c  Noted DDI: amiodarone may increase INR - monitor  Conservative warfarin dosing d/t weight only 45 kg  Goal of Therapy:  INR 2-3   Plan:   Warfarin 2.5 mg PO tonight at 1800  Follow daily INR   Lynann Beaver PharmD, BCPS Pager [phone] 08/06/2011 1:58 PM

## 2011-08-06 NOTE — Progress Notes (Signed)
Subjective: 3 Days Post-Op Procedure(s) (LRB): TOTAL HIP ARTHROPLASTY ANTERIOR APPROACH (Right) Patient reports pain as moderate.  Some progress with therapy, but slow.  Objective: Vital signs in last 24 hours: Temp:  [98.9 F (37.2 C)-99.2 F (37.3 C)] 98.9 F (37.2 C) (05/19 2145) Pulse Rate:  [86-91] 86  (05/19 2145) Resp:  [10-16] 10  (05/20 0406) BP: (94-113)/(55-72) 113/72 mmHg (05/19 2145) SpO2:  [92 %-98 %] 97 % (05/20 0406)  Intake/Output from previous day: 05/19 0701 - 05/20 0700 In: 2257.2 [P.O.:840; I.V.:1417.2] Out: 1200 [Urine:1200] Intake/Output this shift: Total I/O In: 1657.2 [P.O.:240; I.V.:1417.2] Out: 500 [Urine:500]   Basename 08/06/11 0545 08/05/11 0425 08/04/11 0325  HGB 10.3* 11.0* 7.3*    Basename 08/06/11 0545 08/05/11 0425  WBC 9.0 10.4  RBC 3.52* 3.79*  HCT 31.2* 33.0*  PLT 205 207    Basename 08/04/11 0325  NA 134*  K 3.9  CL 102  CO2 27  BUN 15  CREATININE 0.87  GLUCOSE 90  CALCIUM 6.6*    Basename 08/05/11 0425 08/04/11 2200  LABPT -- --  INR 1.66* 1.78*    Sensation intact distally Intact pulses distally Dorsiflexion/Plantar flexion intact Incision: scant drainage  Assessment/Plan: 3 Days Post-Op Procedure(s) (LRB): TOTAL HIP ARTHROPLASTY ANTERIOR APPROACH (Right) Plan for discharge tomorrow D/C PCA  Kabrina Christiano Y 08/06/2011, 6:22 AM

## 2011-08-06 NOTE — Progress Notes (Signed)
Physical Therapy Treatment Patient Details Name: [name] MRN: [id number] DOB: Aug 16, 1945 Today's Date: 08/06/2011 Time: 4098-1191 PT Time Calculation (min): 25 min  PT Assessment / Plan / Recommendation Comments on Treatment Session  Pt asked if she could use her 4WW that she has at home which has a seat for her O2 tank.  Advised pt that should be fine as she is WBAT and demon good balance and safety cognition.  Pt plans to D/C to home tomorrow.    Follow Up Recommendations  Home health PT    Barriers to Discharge        Equipment Recommendations  3 in 1 bedside comode;Other (comment) (advised pt she should be okay to use her 4WW that she alread)    Recommendations for Other Services    Frequency 7X/week   Plan Discharge plan remains appropriate    Precautions / Restrictions Precautions Precautions: None Precaution Comments: direct anterior THA Restrictions Weight Bearing Restrictions: No   Pertinent Vitals/Pain 3/10 R hip pain    Mobility  Bed Mobility Bed Mobility: Sit to Supine;Supine to Sit Supine to Sit: 4: Min guard Sit to Supine: 4: Min assist Details for Bed Mobility Assistance: with instructions for spouse to assist Transfers Transfers: Sit to Stand;Stand to Sit Sit to Stand: 4: Min guard;From bed;With upper extremity assist Stand to Sit: 4: Min assist Details for Transfer Assistance: 50% VC's on hand placement to push up from behind vs pull self up using RW. Ambulation/Gait Ambulation/Gait Assistance: 4: Min guard Ambulation Distance (Feet): 70 Feet Assistive device: Rolling walker Ambulation/Gait Assistance Details: On 2 lts O2, sats avg 93% with DOE 2/4. Gait Pattern: Step-through pattern;Decreased stance time - right Gait velocity: amb with spouse under direction of PTA with instructions on safety with turns and backward gait sequencing. Stairs: No Corporate treasurer: No    PT Goals Acute Rehab PT Goals PT Goal  Formulation: With patient Potential to Achieve Goals: Good Pt will go Supine/Side to Sit: with supervision PT Goal: Supine/Side to Sit - Progress: Progressing toward goal Pt will go Sit to Stand: with modified independence PT Goal: Sit to Stand - Progress: Progressing toward goal Pt will Ambulate: 51 - 150 feet;with supervision;with least restrictive assistive device PT Goal: Ambulate - Progress: Progressing toward goal  Visit Information  Last PT Received On: 08/06/11 Assistance Needed: +1    Subjective Data  Subjective: I want to get better Patient Stated Goal: to be able to walk   Cognition  Overall Cognitive Status: Appears within functional limits for tasks assessed/performed Arousal/Alertness: Awake/alert Orientation Level: Appears intact for tasks assessed Behavior During Session: [facility] for tasks performed    Balance  Balance Balance Assessed: Yes Dynamic Standing Balance Dynamic Standing - Level of Assistance: 4: Min assist;Other (comment) (pull up Depends)  End of Session PT - End of Session Equipment Utilized During Treatment: Gait belt Activity Tolerance: Patient tolerated treatment well Patient left: in bed;with call bell/phone within reach;with family/visitor present (spouse)    Felecia Shelling  PTA [facility]  Acute  Rehab Pager     [phone]

## 2011-08-07 LAB — PROTIME-INR
INR: 1.02 (ref 0.00–1.49)
Prothrombin Time: 13.6 seconds (ref 11.6–15.2)

## 2011-08-07 MED ORDER — HEPARIN SOD (PORK) LOCK FLUSH 100 UNIT/ML IV SOLN
250.0000 [IU] | INTRAVENOUS | Status: AC | PRN
  Administered 2011-08-07: 250 [IU]

## 2011-08-07 MED ORDER — WARFARIN SODIUM 4 MG PO TABS
4.0000 mg | ORAL_TABLET | Freq: Once | ORAL | Status: DC
  Filled 2011-08-07: qty 1

## 2011-08-07 MED ORDER — HEPARIN SOD (PORK) LOCK FLUSH 100 UNIT/ML IV SOLN
250.0000 [IU] | INTRAVENOUS | Status: DC | PRN
  Filled 2011-08-07: qty 3

## 2011-08-07 MED ORDER — WARFARIN SODIUM 5 MG PO TABS: 5.0000 mg | ORAL_TABLET | Freq: Every day | ORAL | Status: DC

## 2011-08-07 MED ORDER — HEPARIN SOD (PORK) LOCK FLUSH 100 UNIT/ML IV SOLN
250.0000 [IU] | Freq: Every day | INTRAVENOUS | Status: DC
  Filled 2011-08-07: qty 3

## 2011-08-07 NOTE — Progress Notes (Signed)
Pt for d/c home today with [facility] HHPT/ RN. Order to D/C with PICC line requested by pt & ordered by MD r/t being a hard stick & will need f/u with Coumadin tx. Dressing CDI to R hip. Dressing instructions emphasized to pt. Husband at bedside to assist pt with D/C. No changes in am assessments today. 3 in 1 available for pt to bring home with her.

## 2011-08-07 NOTE — Progress Notes (Signed)
Physical Therapy Treatment Patient Details Name: [name] MRN: [id number] DOB: May 19, 1945 Today's Date: 08/07/2011 Time: 1025-1050 PT Time Calculation (min): 25 min  PT Assessment / Plan / Recommendation Comments on Treatment Session  Pt plans to D/C to home today.  Family in room during session and assisted/observed.  Instructed on use of ICE and safety negociating thru small spaces by side stepping with RW.    Follow Up Recommendations  Home health PT    Barriers to Discharge        Equipment Recommendations  3 in 1 bedside comode    Recommendations for Other Services    Frequency     Plan Discharge plan remains appropriate    Precautions / Restrictions Precautions Precautions: None   Pertinent Vitals/Pain C/o "soreness"    Mobility  Bed Mobility Bed Mobility: Supine to Sit Supine to Sit: 5: Supervision Details for Bed Mobility Assistance: pt OOB in recliner Transfers Transfers: Sit to Stand;Stand to Sit Sit to Stand: 5: Supervision;With upper extremity assist;From chair/3-in-1 Stand to Sit: 5: Supervision;With upper extremity assist;To chair/3-in-1 Details for Transfer Assistance: good safety cognition and use of hands Ambulation/Gait Ambulation/Gait Assistance: 5: Supervision Ambulation Distance (Feet): 180 Feet Assistive device: Rolling walker Ambulation/Gait Assistance Details: On 2lts O2 sats avg 95%. Gait Pattern: Step-through pattern;Decreased stance time - right;Trunk flexed Gait velocity: amb with spouse with one VC on safety with backward gait and side stepping to negociate thru small spaces. Stairs: No (Pt only has a threshold into home) Wheelchair Mobility Wheelchair Mobility: No     PT Goals Acute Rehab PT Goals PT Goal Formulation: With patient Potential to Achieve Goals: Good Pt will go Sit to Stand: with modified independence PT Goal: Sit to Stand - Progress: Progressing toward goal Pt will Ambulate: 51 - 150 feet;with supervision;with  least restrictive assistive device PT Goal: Ambulate - Progress: Progressing toward goal  Visit Information  Last PT Received On: 08/07/11 Assistance Needed: +1    Subjective Data  Subjective: Excited to go home   Cognition  Overall Cognitive Status: Appears within functional limits for tasks assessed/performed Arousal/Alertness: Awake/alert Orientation Level: Appears intact for tasks assessed Behavior During Session: [facility] for tasks performed    Balance   good with RW  End of Session PT - End of Session Equipment Utilized During Treatment: Gait belt Activity Tolerance: Patient tolerated treatment well Patient left: in chair;with call bell/phone within reach;with family/visitor present Nurse Communication: Other (comment) (Pt ready for D/C to home)    Felecia Shelling  PTA [facility]  Acute  Rehab Pager     [phone]

## 2011-08-07 NOTE — Progress Notes (Signed)
ANTICOAGULATION CONSULT NOTE - Follow up Consult  Pharmacy Consult for Warfarin Indication: VTE ppx s/p R THA  Allergies  Allergen Reactions  . Dabigatran Etexilate Mesylate Other (See Comments)    INTERNAL BLEEDING  . Pentazocine Lactate Other (See Comments)    HALLUCINATIONS  . Sulfonamide Derivatives Other (See Comments)    JITTERINESS     Patient Measurements: Height: 5\' 2"  (157.5 cm) Weight: 99 lb 6 oz (45.076 kg) IBW/kg (Calculated) : 50.1   Vital Signs: Temp: 97.6 F (36.4 C) (05/21 0507) BP: 101/67 mmHg (05/21 0507) Pulse Rate: 71  (05/21 0507)  Labs:  Basename 08/07/11 0515 08/06/11 0545 08/05/11 0425  HGB -- 10.3* 11.0*  HCT -- 31.2* 33.0*  PLT -- 205 207  APTT -- -- --  LABPROT 13.6 15.7* 19.9*  INR 1.02 1.22 1.66*  HEPARINUNFRC -- -- --  CREATININE -- -- --  CKTOTAL -- -- --  CKMB -- -- --  TROPONINI -- -- --    Estimated Creatinine Clearance: 45.3 ml/min (by C-G formula based on Cr of 0.87).  Medications:  Scheduled:     . amiodarone  200 mg Oral Daily  . carvedilol  6.25 mg Oral BID WC  . docusate sodium  100 mg Oral BID  . fentaNYL  50 mcg Transdermal Q72H  . furosemide  20 mg Oral Daily  . gabapentin  600 mg Oral QID  . heparin lock flush  250 Units Intracatheter Daily  . pantoprazole  40 mg Oral Q1200  . tiotropium  18 mcg Inhalation Daily  . warfarin  2.5 mg Oral ONCE-1800  . Warfarin - Pharmacist Dosing Inpatient   Does not apply q1800   Infusions:     . sodium chloride 20 mL/hr at 08/06/11 2152   Inpatient Warfarin Doses: 5/19 2.5mg , 5/20 2.5mg   Assessment:  66 yo F s/p R THA on 5/17 with warfarin per pharmacy for VTE prophylaxis  Patient was initially started on Xarelto 5/18, however pt. has increased risk of bleeding on Xarelto with a Crcl 30-50 and also on amiodarone.  CBC ok, no reports of bleeding or complications  INR subtherapeutic and dropping after warfarin 2.5mg  x 2 doses  Noted DDI: amiodarone may increase  INR - monitor  Conservative warfarin dosing initially d/t weight only 45 kg  Goal of Therapy:  INR 2-3   Plan:   Warfarin 4 mg PO tonight at 1800  Follow daily INR  Should Lovenox 40mg  daily be considered at this point until INR therapeutic?   Hessie Knows, PharmD, BCPS Pager [phone] 08/07/2011 12:58 PM

## 2011-08-07 NOTE — Progress Notes (Signed)
Discharge summary sent to payer through [facility]  

## 2011-08-07 NOTE — Progress Notes (Signed)
CARE MANAGEMENT NOTE 08/07/2011  Patient:  [name], [name]   Account Number:  [id number]  Date Initiated:  08/05/2011  Documentation initiated by:  Ross,Becky  Subjective/Objective Assessment:   66 yo female admitted s/p right total hip replacement.     Action/Plan:   home when stable   Anticipated DC Date:  08/07/2011   Anticipated DC Plan:  HOME W HOME HEALTH SERVICES  In-house referral  NA      DC Planning Services  CM consult      [facility] Choice  HOME HEALTH  DURABLE MEDICAL EQUIPMENT   Choice offered to / List presented to:  C-1 Patient   DME arranged  Levan Hurst      DME agency  [facility]     HH arranged  HH-2 PT      [facility] agency  [facility]   Status of service:  Completed, signed off Medicare Important Message given?  NO (If response is "NO", the following Medicare IM given date fields will be blank) Date Medicare IM given:   Date Additional Medicare IM given:    Discharge Disposition:  HOME W HOME HEALTH SERVICES    Comments:  08/07/2011 [name] BSN CCM [phone] Pt for discharge today; [facility] will provide [facility] -coumadin managemnt, PIcc line care and HHPt/services to start tomorrow.

## 2011-08-07 NOTE — Discharge Summary (Signed)
Patient ID: [name] MRN: [id number] DOB/AGE: 67-Apr-1947 67 y.o.  Admit date: 08/03/2011 Discharge date: 08/07/2011  Admission Diagnoses:  Principal Problem:  *Degenerative arthritis of hip   Discharge Diagnoses:  Same  Past Medical History  Diagnosis Date  . GERD (gastroesophageal reflux disease)   . CAD (coronary artery disease)     mild disease per cath in 2011  . LBBB (left bundle branch block)   . Atrial fibrillation     on amiodarone  . Venous embolism and thrombosis of subclavian vein     after pacemaker insertion Oct 2011  . Pleural effusion      s/p right thoracentesis 03/09  . Small bowel obstruction   . Urinary incontinence      -INTERSTIM IMPLANT NOT FUNCTIONING PER PT  . RLS (restless legs syndrome)   . Primary dilated cardiomyopathy     EF 45 to 50% per echo in Jan 2012  . Vitamin B12 deficiency   . Gastroparesis   . Chronic nausea   . Chronic abdominal pain   . Chronic pain syndrome   . Pacemaker     CRT therapy; followed by Dr. Graciela Husbands  . H/O: GI bleed     from Pradaxa  . Oxygen dependent     2 liters via nasal cannula at all times  . CHF (congestive heart failure)   . Anemia   . Hx of stress fx aug 2011    right hip   . Elbow fracture, left aug 2012  . Arthritis     right hip  . Hx of gastric ulcer   . Diverticulitis   . HTN (hypertension)     under control; has been on med. x "years"  . COPD (chronic obstructive pulmonary disease)     continuous O2  . Hx MRSA infection   . Pneumonia - 03/09, 12/09, 02/10, 06/10    MOST RECENT FEB 2013  . Fibromyalgia     COUPLE OF TIMES A YEAR-PT HAS EPISODES OF CONFUSION-USUALLY INVOLVES DAY/NIGHT REVERSAL AND EPISODES OF TWITCHING AND FALLS--SEES DR. Modesto Charon - NEUROLOGIST-LAST SEEN 07/10/11  . Elevated liver enzymes   . Blood transfusion     Surgeries: Procedure(s): TOTAL HIP ARTHROPLASTY ANTERIOR APPROACH on 08/03/2011   Consultants:    Discharged Condition: Improved  Hospital Course: [name] is an 66 y.o. female who was admitted 08/03/2011 for operative treatment ofDegenerative arthritis of hip. Patient has severe unremitting pain that affects sleep, daily activities, and work/hobbies. After pre-op clearance the patient was taken to the operating room on 08/03/2011 and underwent  Procedure(s): TOTAL HIP ARTHROPLASTY ANTERIOR APPROACH.    Patient was given perioperative antibiotics: Anti-infectives     Start     Dose/Rate Route Frequency Ordered Stop   08/03/11 1600   ceFAZolin (ANCEF) IVPB 1 g/50 mL premix        1 g 100 mL/hr over 30 Minutes Intravenous Every 6 hours 08/03/11 1246 08/04/11 0413   08/03/11 0744   ceFAZolin (ANCEF) IVPB 1 g/50 mL premix        1 g 100 mL/hr over 30 Minutes Intravenous 60 min pre-op 08/03/11 0744 08/03/11 0940           Patient was given sequential compression devices, early ambulation, and chemoprophylaxis to prevent DVT.  Patient benefited maximally from hospital stay and there were no complications.    Recent vital signs: Patient Vitals for the past 24 hrs:  BP Temp Temp src Pulse Resp SpO2  08/07/11  0507 101/67 mmHg 97.6 F (36.4 C) - 71  16  96 %  08/06/11 2131 113/68 mmHg 97.4 F (36.3 C) - 72  18  96 %  08/06/11 1620 120/75 mmHg - - 79  - -  08/06/11 1450 99/59 mmHg 98 F (36.7 C) Oral 76  16  98 %  08/06/11 0942 - - - - - 96 %  08/06/11 0910 96/61 mmHg - - 80  - -     Recent laboratory studies:  Basename 08/06/11 0545 September 01, 2011 0425  WBC 9.0 10.4  HGB 10.3* 11.0*  HCT 31.2* 33.0*  PLT 205 207  NA -- --  K -- --  CL -- --  CO2 -- --  BUN -- --  CREATININE -- --  GLUCOSE -- --  INR 1.22 1.66*  CALCIUM -- --     Discharge Medications:   Medication List  As of 08/07/2011  6:34 AM   STOP taking these medications         etodolac 400 MG tablet         TAKE these medications         albuterol 108 (90 BASE) MCG/ACT inhaler   Commonly known as: PROVENTIL HFA;VENTOLIN HFA   Inhale 2 puffs into the lungs  every 6 (six) hours as needed for wheezing.      amiodarone 200 MG tablet   Commonly known as: PACERONE   Take 1 tablet (200 mg total) by mouth every morning.      carvedilol 6.25 MG tablet   Commonly known as: COREG   Take 1 tablet (6.25 mg total) by mouth 2 (two) times daily with a meal.      cyclobenzaprine 10 MG tablet   Commonly known as: FLEXERIL   Take 10 mg by mouth 3 (three) times daily as needed. For pain      fentaNYL 50 MCG/HR   Commonly known as: DURAGESIC - dosed mcg/hr   Place 1 patch (50 mcg total) onto the skin every 3 (three) days.      furosemide 20 MG tablet   Commonly known as: LASIX   Take 1 tablet (20 mg total) by mouth every morning.      gabapentin 600 MG tablet   Commonly known as: NEURONTIN   Take 1 tablet (600 mg total) by mouth 4 (four) times daily.      nitroGLYCERIN 0.4 MG SL tablet   Commonly known as: NITROSTAT   Place 1 tablet (0.4 mg total) under the tongue every 5 (five) minutes as needed for chest pain. CHEST PAIN        oxyCODONE-acetaminophen 10-325 MG per tablet   Commonly known as: PERCOCET   Take 1 tablet by mouth every 4 (four) hours as needed. PAIN      oxyCODONE-acetaminophen 10-325 MG per tablet   Commonly known as: PERCOCET   Take 1 tablet by mouth every 6 (six) hours as needed for pain.      pantoprazole 40 MG tablet   Commonly known as: PROTONIX   Take 40 mg by mouth daily with breakfast.      PROLIA 60 MG/ML Soln injection   Generic drug: denosumab   Inject 60 mg into the skin every 6 (six) months. Twice a year      promethazine 25 MG tablet   Commonly known as: PHENERGAN   Take 1 tablet (25 mg total) by mouth every 8 (eight) hours as needed for nausea.      temazepam  15 MG capsule   Commonly known as: RESTORIL   Take 15-30 mg by mouth at bedtime as needed. For sleep      tiotropium 18 MCG inhalation capsule   Commonly known as: SPIRIVA   Place 1 capsule (18 mcg total) into inhaler and inhale every morning.       warfarin 5 MG tablet   Commonly known as: COUMADIN   Take 1 tablet (5 mg total) by mouth daily.            Diagnostic Studies: Dg Chest 2 View  07/30/2011  *RADIOLOGY REPORT*  Clinical Data: Preop right hip surgery  CHEST - 2 VIEW  Comparison: Chest x-ray 05/22/2011, chest CT 05/04/2011  Findings: Left basilar nodular shadows are more prominent than on the prior study. These correspond to subacute healing rib fractures based on the prior chest CT which showed left anterior rib fractures and callus formation.  Right lower lobe airspace disease has improved since the prior study.  There is some residual right apical density which is most likely scarring.  Negative for heart failure or effusion.  Port-A-Cath has been removed since the prior study.  Three lead pacer is unchanged. Heart size is normal.  IMPRESSION: Healing left anterior rib fractures are present, accounting for nodular densities in the left lung base.  Improvement in right lower lobe infiltrate compared with the prior chest CT.  Residual right upper lobe density may be infiltrate or scarring.  Original Report Authenticated By: Camelia Phenes, M.D.   Dg Hip Complete Right  08/03/2011  *RADIOLOGY REPORT*  Clinical Data: Hip replacement.  RIGHT HIP - COMPLETE 2+ VIEW  Comparison: None.  Findings: We are provided two fluoroscopic spot intraoperative views of the right hip.  Images demonstrate a total hip replacement in place.  Device appears located.  No fracture is identified.  IMPRESSION: Right total hip replacement.  Original Report Authenticated By: Bernadene Bell. Maricela Curet, M.D.   US Abdomen Complete  07/12/2011  *RADIOLOGY REPORT*  Clinical Data:  Hydronephrosis.  COMPLETE ABDOMINAL ULTRASOUND  Comparison:  CT abdomen and pelvis 08/08/2010 and12/09/2008. Abdominal ultrasound 09/07/2008.  Findings:  Gallbladder:  Tiny amount of sludge is seen in the gallbladder.  No stones, wall thickening or pericholecystic fluid.  Sonographer reports  negative Murphy's sign.  Common bile duct:  Measures 1.1 cm, unchanged.  Liver:  No focal lesion is identified.  There is some mild intrahepatic biliary ductal dilatation.  IVC:  Appears normal.  Pancreas:  No focal lesion is seen.  The pancreatic duct is prominent at 0.3 cm, unchanged.  Spleen:  Measures 9.0 cm and appears normal.  Right Kidney:  Measures 10.5 cm. Mild fullness of the right renal collecting system without frank hydronephrosis.  No stone or mass.  Left Kidney:  8.0 cm.  No stone, mass or hydronephrosis.  Abdominal aorta:  No aneurysm identified.  IMPRESSION:  1.  Mild fullness the right renal collecting system without frank hydronephrosis. 2.  Chronic intra and extrahepatic biliary ductal dilatation. 3.  Tiny amountof gallbladder sludge without evidence of cholecystitis.  Original Report Authenticated By: Bernadene Bell. Maricela Curet, M.D.   Dg Pelvis Portable  08/03/2011  *RADIOLOGY REPORT*  Clinical Data: Status post hip replacement.  PORTABLE PELVIS  Comparison: Plain films of the right hip 03/16/2011.  Findings: The patient has a new right total hip arthroplasty.  The device is located and there is no fracture.  Gas in the soft tissues from surgery and surgical staples noted.  IMPRESSION: Right  total hip replacement without evidence of complication.  Original Report Authenticated By: Bernadene Bell. Maricela Curet, M.D.   Ir Fluoro Guide Cv Line Right  08/02/2011  *RADIOLOGY REPORT*  Clinical Data: preoperative access, poor peripheral veins, hip osteoarthritis, plan for hip replacement  PICC LINE PLACEMENT WITH ULTRASOUND AND FLUOROSCOPIC  GUIDANCE  Fluoroscopy Time: 1.2 minutes.  The right arm was prepped with chlorhexidine, draped in the usual sterile fashion using maximum barrier technique (cap and mask, sterile gown, sterile gloves, large sterile sheet, hand hygiene and cutaneous antisepsis) and infiltrated locally with 1% Lidocaine.  Ultrasound demonstrated patency of the right brachial vein, and this  was documented with an image.  Under real-time ultrasound guidance, this vein was accessed with a 21 gauge micropuncture needle and image documentation was performed.  The needle was exchanged over a guidewire for a peel-away sheath through which a 5 [place] double lumen PICC trimmed to 36 cm was advanced, positioned with its tip at the lower SVC/right atrial junction.  Fluoroscopy during the procedure and fluoro spot radiograph confirms appropriate catheter position.  The catheter was flushed, secured to the skin with Prolene sutures, and covered with a sterile dressing.  Complications:  No immediate  IMPRESSION: Successful right arm PICC line placement with ultrasound and fluoroscopic guidance.  The catheter is ready for use.  Original Report Authenticated By: Judie Petit. Ruel Favors, M.D.   Ir US Guide Vasc Access Right  08/02/2011  *RADIOLOGY REPORT*  Clinical Data: preoperative access, poor peripheral veins, hip osteoarthritis, plan for hip replacement  PICC LINE PLACEMENT WITH ULTRASOUND AND FLUOROSCOPIC  GUIDANCE  Fluoroscopy Time: 1.2 minutes.  The right arm was prepped with chlorhexidine, draped in the usual sterile fashion using maximum barrier technique (cap and mask, sterile gown, sterile gloves, large sterile sheet, hand hygiene and cutaneous antisepsis) and infiltrated locally with 1% Lidocaine.  Ultrasound demonstrated patency of the right brachial vein, and this was documented with an image.  Under real-time ultrasound guidance, this vein was accessed with a 21 gauge micropuncture needle and image documentation was performed.  The needle was exchanged over a guidewire for a peel-away sheath through which a 5 [place] double lumen PICC trimmed to 36 cm was advanced, positioned with its tip at the lower SVC/right atrial junction.  Fluoroscopy during the procedure and fluoro spot radiograph confirms appropriate catheter position.  The catheter was flushed, secured to the skin with Prolene sutures, and covered  with a sterile dressing.  Complications:  No immediate  IMPRESSION: Successful right arm PICC line placement with ultrasound and fluoroscopic guidance.  The catheter is ready for use.  Original Report Authenticated By: Judie Petit. Ruel Favors, M.D.   Dg Hip Portable 1 View Right  08/03/2011  *RADIOLOGY REPORT*  Clinical Data: Hip replacement.  PORTABLE RIGHT HIP - 1 VIEW  Comparison: Plain films right hip 03/08/2011.  Findings: Right total hip replacement is in place.  No fracture is identified.  Gas in the soft tissues and surgical staples are noted.  IMPRESSION: Right hip replacement without evidence of complication.  Original Report Authenticated By: Bernadene Bell. D'ALESSIO, M.D.   Dg C-arm 1-60 Min-no Report  08/03/2011  CLINICAL DATA: anterior hip   C-ARM 1-60 MINUTES  Fluoroscopy was utilized by the requesting physician.  No radiographic  interpretation.      Disposition: 01-Home or Self Care  Discharge Orders    Future Appointments: Provider: Department: Dept Phone: Center:   09/03/2011 11:00 AM [facility] Laurette Schimke Office [phone] [facility]  Future Orders Please Complete By Expires   Diet - low sodium heart healthy      Care order/instruction      Scheduling Instructions:   Have PICC line removed prior to d/c   Call MD / Call 911      Comments:   If you experience chest pain or shortness of breath, CALL 911 and be transported to the hospital emergency room.  If you develope a fever above 101 F, pus (white drainage) or increased drainage or redness at the wound, or calf pain, call your surgeon's office.   Constipation Prevention      Comments:   Drink plenty of fluids.  Prune juice may be helpful.  You may use a stool softener, such as Colace (over the counter) 100 mg twice a day.  Use MiraLax (over the counter) for constipation as needed.   Increase activity slowly as tolerated      Weight Bearing as taught in Physical Therapy      Comments:   Use a walker or crutches as  instructed.   Discharge instructions      Comments:   You can increase your activities as comfort allows. You can get your actual incision wet in the shower starting 08/08/11. Daily dry dressing as needed. Follow-up at [facility] in 2 weeks   Discharge patient            Signed: Kathryne Hitch 08/07/2011, 6:34 AM

## 2011-08-07 NOTE — Progress Notes (Signed)
Occupational Therapy Treatment Patient Details Name: [name] MRN: [id number] DOB: 09/25/1945 Today's Date: 08/07/2011 Time: 4098-1191 OT Time Calculation (min): 28 min  OT Assessment / Plan / Recommendation Comments on Treatment Session Pt doing well. Practiced grooming standing at the sink and shower transfer. Pt states she feels comfortable with toilet transfer with 3in1 and doesnt need to practice with OT. Pt to discharge today with husband assisting PRN.    Follow Up Recommendations  No OT follow up    Barriers to Discharge       Equipment Recommendations  3 in 1 bedside comode    Recommendations for Other Services    Frequency Min 2X/week   Plan Discharge plan remains appropriate    Precautions / Restrictions Precautions Precautions: None Restrictions Weight Bearing Restrictions: No        ADL  Grooming: Performed;Teeth care;Supervision/safety Tub/Shower Transfer: Simulated;Minimal assistance Tub/Shower Transfer Method: Other (comment) (step back over shower ledge) Equipment Used: Rolling walker ADL Comments: Per PT note, pt plans to use 4 wheel walker to be able to carry around her O2 tank. Discussed with patient to not try to lift 4 wheel walker over shower ledge as it will be heavier/more ackward to lift over and decrease her safety. Explained how pt can position her shower seat toward the front of the shower so when she steps back into the shower , she can then sit right down. She can use walker handles to help her make the step back over ledge while husband holds walker steady. Pt verbalizes understanding.    OT Diagnosis:    OT Problem List:   OT Treatment Interventions:     OT Goals ADL Goals ADL Goal: Grooming - Progress: Met ADL Goal: Tub/Shower Transfer - Progress: Met  Visit Information  Last OT Received On: 08/07/11 Assistance Needed: +1    Subjective Data  Subjective: I would like to brush my teeth Patient Stated Goal: to brush teeth     Prior Functioning       Cognition  Overall Cognitive Status: Appears within functional limits for tasks assessed/performed Arousal/Alertness: Awake/alert Orientation Level: Appears intact for tasks assessed Behavior During Session: [facility] for tasks performed    Mobility Bed Mobility Bed Mobility: Supine to Sit Supine to Sit: 5: Supervision Transfers Transfers: Sit to Stand;Stand to Sit Sit to Stand: 5: Supervision;With upper extremity assist;From bed Stand to Sit: 5: Supervision;With upper extremity assist;To chair/3-in-1 Details for Transfer Assistance: min verbal cues as pt tends to forget to reach back for armrests before sitting down.    Exercises    Balance    End of Session OT - End of Session Activity Tolerance: Patient tolerated treatment well Patient left: in chair;with call bell/phone within reach   Lennox Laity [phone] 08/07/2011, 9:49 AM

## 2011-08-08 ENCOUNTER — Telehealth: Payer: Self-pay

## 2011-08-08 ENCOUNTER — Telehealth: Payer: Self-pay | Admitting: *Deleted

## 2011-08-08 DIAGNOSIS — D649 Anemia, unspecified: Secondary | ICD-10-CM

## 2011-08-08 NOTE — Telephone Encounter (Signed)
Spoke with patient and she had her hip surgery last Friday and came home yesterday. She has done well but did require 2 units of blood after surgery. She reports she did have to have her porta cath removed and now has a double PICC. Also reports she is on Coumadin. [facility] and P[facility] care. She did not have an iron infusion prior to her surgery due to her porta cath not working. She wants to know if she needs labs, OV or to have iron. Please, advise.

## 2011-08-08 NOTE — Telephone Encounter (Signed)
Spoke with patient and she will have her [facility] nurse call me in AM to discuss lab draw/orders. She will call short stay at [facility] to schedule her IV iron. OV scheduled on 09/07/11 at 1:45 PM with Dr. Juanda Chance

## 2011-08-08 NOTE — Telephone Encounter (Signed)
Discussed with Vernona Rieger and made her aware ok to do the dressing change ut she will nee to follow up with the surgeon to see why the picc line was left in. She stated she would call them tomorrow.      KP

## 2011-08-08 NOTE — Telephone Encounter (Signed)
She will need CBC this coming Monday 08/13/2011 and weekly as long as she is on Coumadin., she still needs her Iron infusion scheduled, please, use the same orders. OVisit in 4 weeks.

## 2011-08-08 NOTE — Telephone Encounter (Signed)
Ok to give verbal for dressing but I have not been getting labs --- shes been seeing GI and surgery.  Why was picc left in?

## 2011-08-08 NOTE — Telephone Encounter (Addendum)
Msg from Vernona Rieger stating the patient had an order for Home health and the patient was released from the hospital with a PICC Line, they order was only for 2-3 weeks only and no order for the picc line was received, she wanted to know if you wanted to gets labs on his patient, and if she could get a 1 time only verbal order to change the picc line dressing?    KP

## 2011-08-10 NOTE — Telephone Encounter (Signed)
Spoke with patient and faxed orders to [facility] at [phone].

## 2011-08-17 ENCOUNTER — Encounter: Payer: Self-pay | Admitting: Internal Medicine

## 2011-08-17 ENCOUNTER — Telehealth: Payer: Self-pay | Admitting: Family Medicine

## 2011-08-17 ENCOUNTER — Other Ambulatory Visit: Payer: Self-pay | Admitting: Family Medicine

## 2011-08-17 DIAGNOSIS — R52 Pain, unspecified: Secondary | ICD-10-CM

## 2011-08-17 IMAGING — CR DG ABDOMEN 2V
2 series · 2 of 2 positions shown · non-contrast
Comparison: CT [DATE]

CLINICAL DATA: Vomiting and diarrhea.  Assess for small bowel
obstruction.

ABDOMEN - 2 VIEW

[view not recorded (1 of 2)]
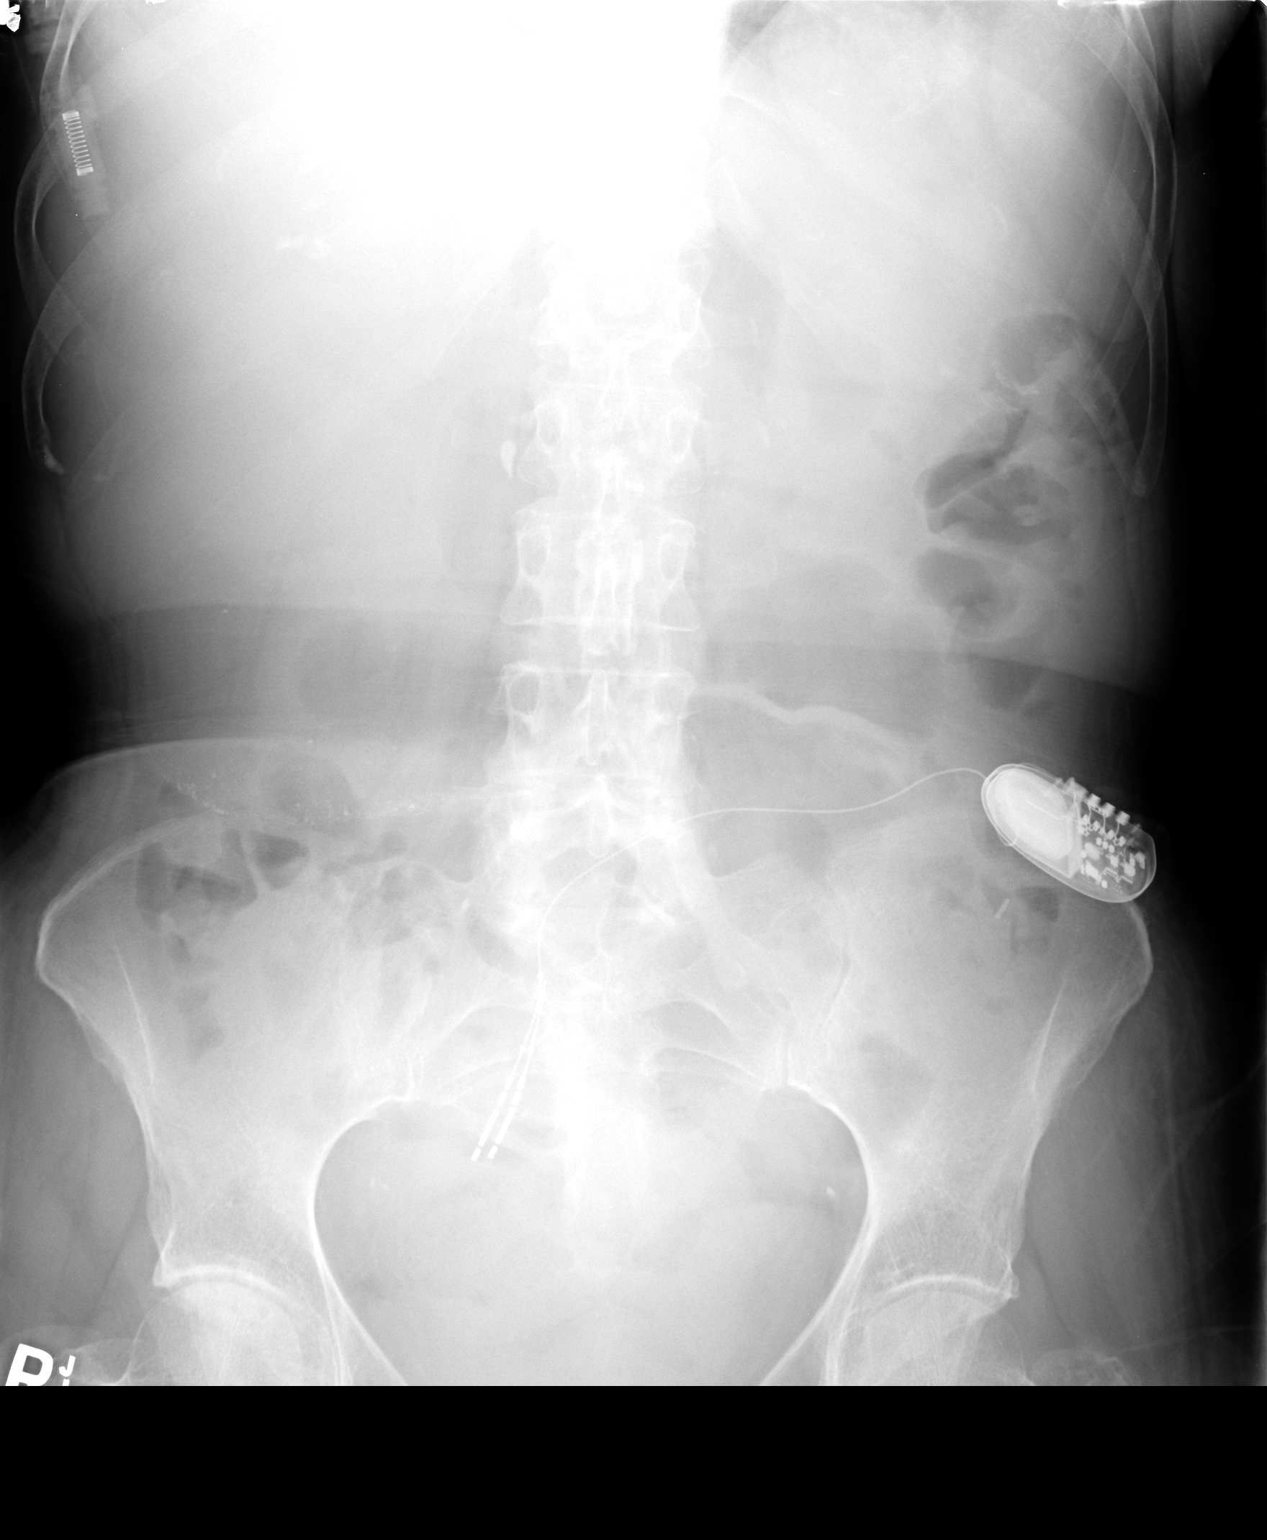

[view not recorded (2 of 2)]
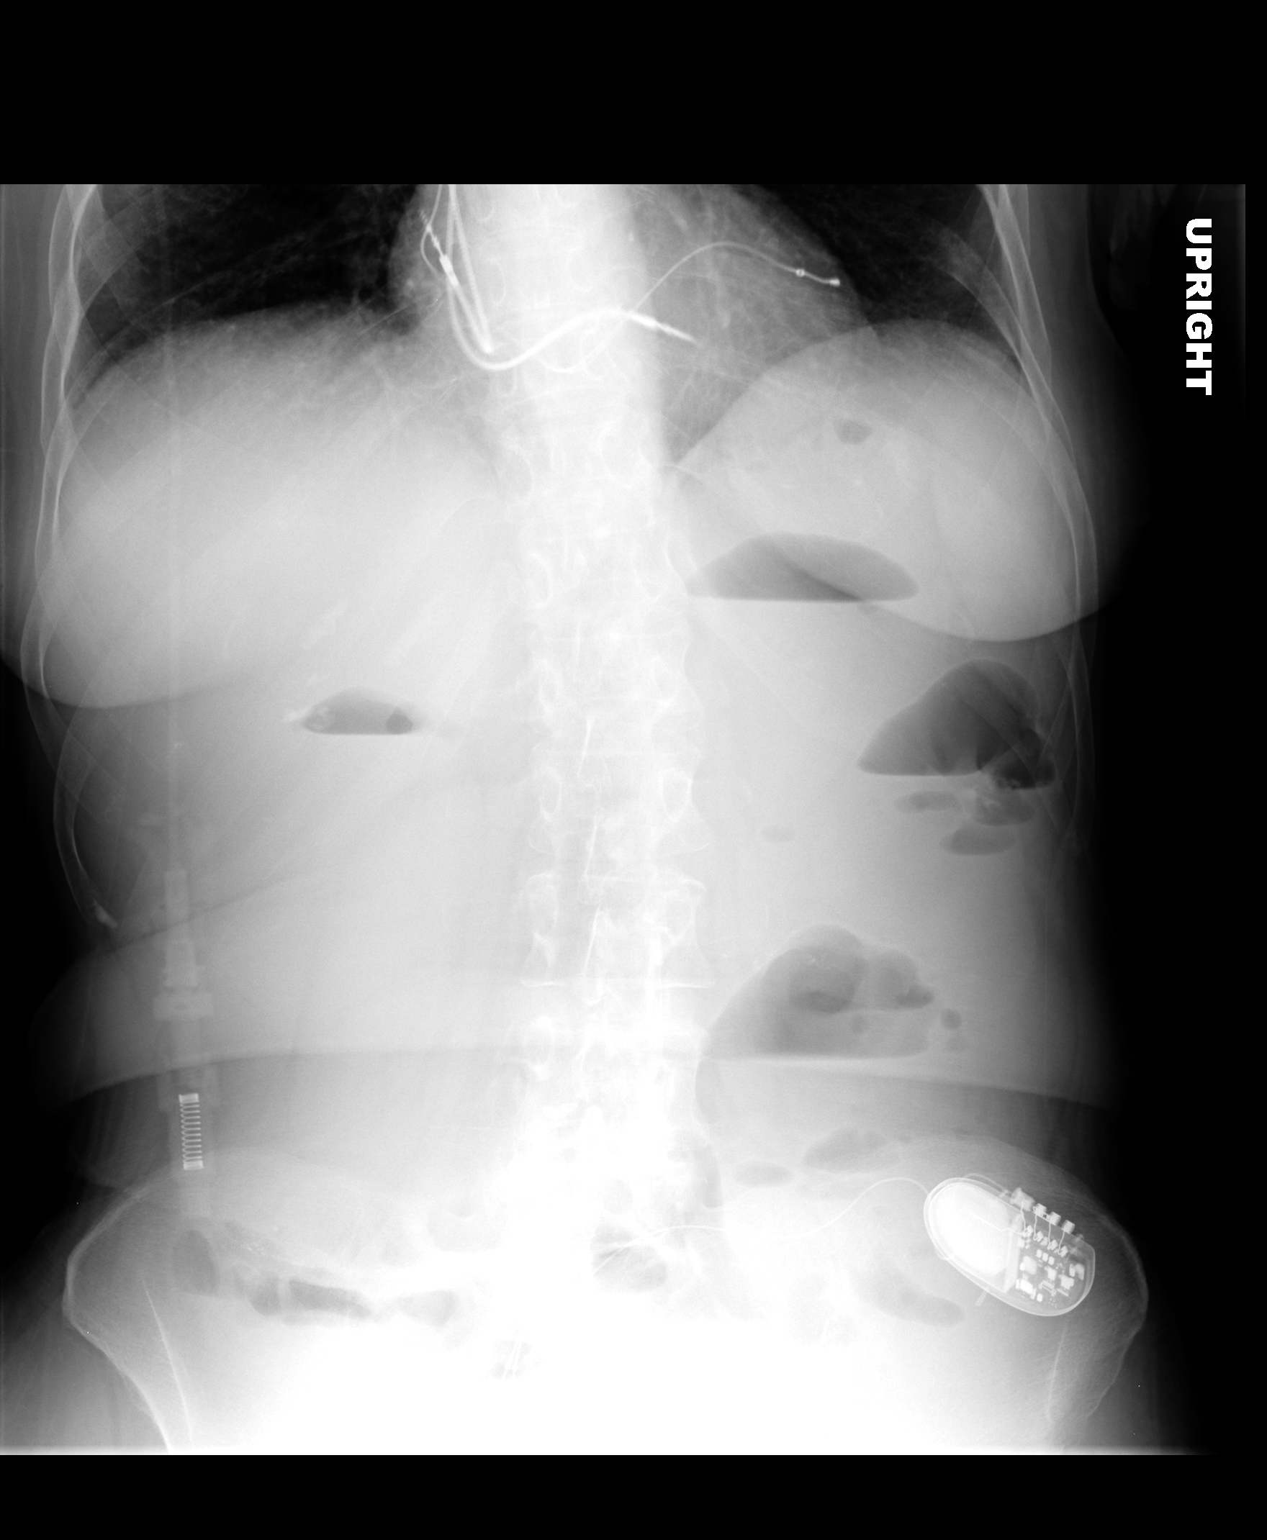

[2 of 2 positions shown; findings below may reference images not displayed]

FINDINGS: There are air-fluid levels and small and large bowel.  I
do not see a any dilated loops to allow diagnosis of obstruction.
No free air.  Sacral neurostimulator remains evident.
IMPRESSION: Air-fluid levels in small and large bowel.  This could be seen in
gastroenteritis or conceivably distal bowel obstruction.

## 2011-08-17 MED ORDER — OXYCODONE-ACETAMINOPHEN 10-325 MG PO TABS: 1.0000 | ORAL_TABLET | ORAL | Status: DC | PRN

## 2011-08-17 MED ORDER — FENTANYL 50 MCG/HR TD PT72: 1.0000 | MEDICATED_PATCH | TRANSDERMAL | Status: DC

## 2011-08-17 NOTE — Telephone Encounter (Signed)
Patient was last seen 04/04/11 and fentanyl filled 07/13/11 # 10 and Oxycodone filled 07/18/11 #120----Dr. Lowne's patient. Please advise     KP

## 2011-08-17 NOTE — Telephone Encounter (Signed)
Dr. Laury Axon is out of town, she refills these medicines routinely. OK to refill one-month supply.

## 2011-08-17 NOTE — Telephone Encounter (Signed)
Patient aware Rx ready for pick up.      KP 

## 2011-08-17 NOTE — Telephone Encounter (Signed)
Patient would like to pick up her rx for oxycodone and fentanyl patch. Call [phone] when ready

## 2011-08-20 IMAGING — CR DG ABD PORTABLE 2V
1 series · 2 of 2 positions shown · non-contrast
Comparison: 10/19/2010.

CLINICAL DATA: Followup small bowel obstruction.

ABDOMEN - 2 VIEW

[Series 1: ap (kub) · U · 2 of 2 slices shown]
[im 1/2]
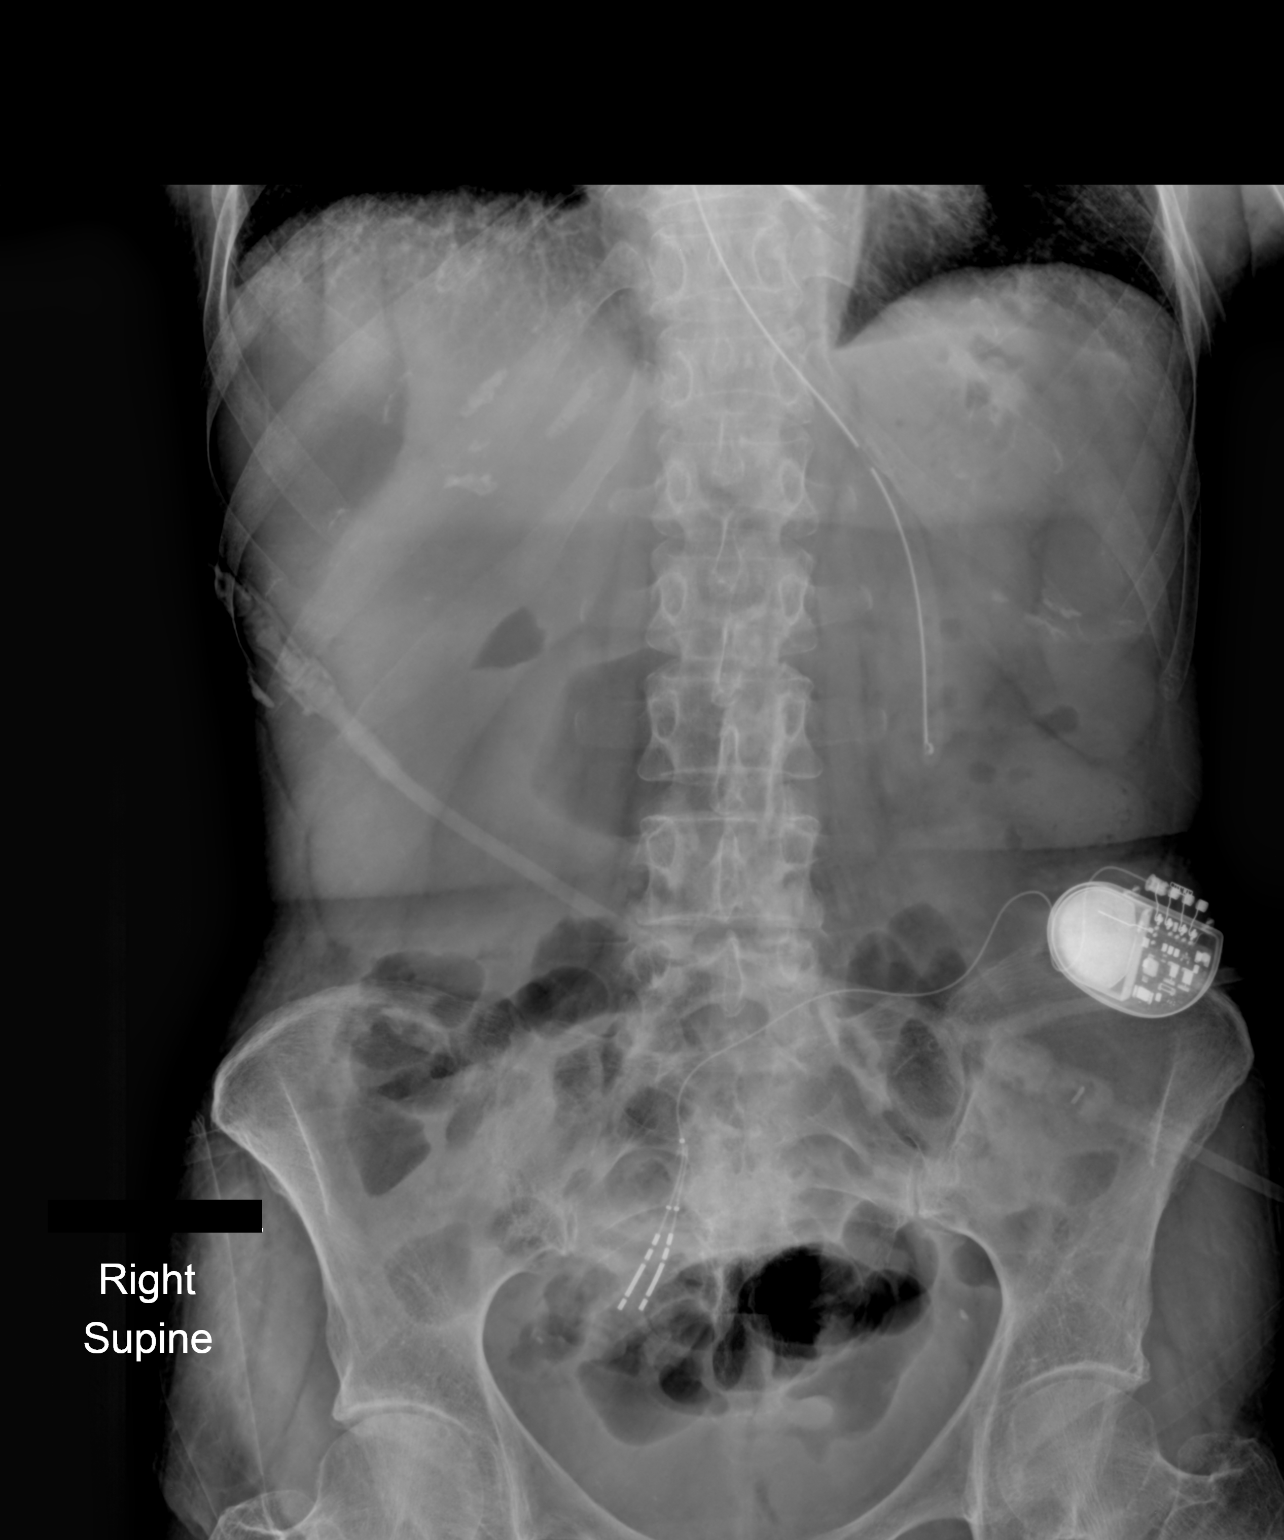
[im 2/2]
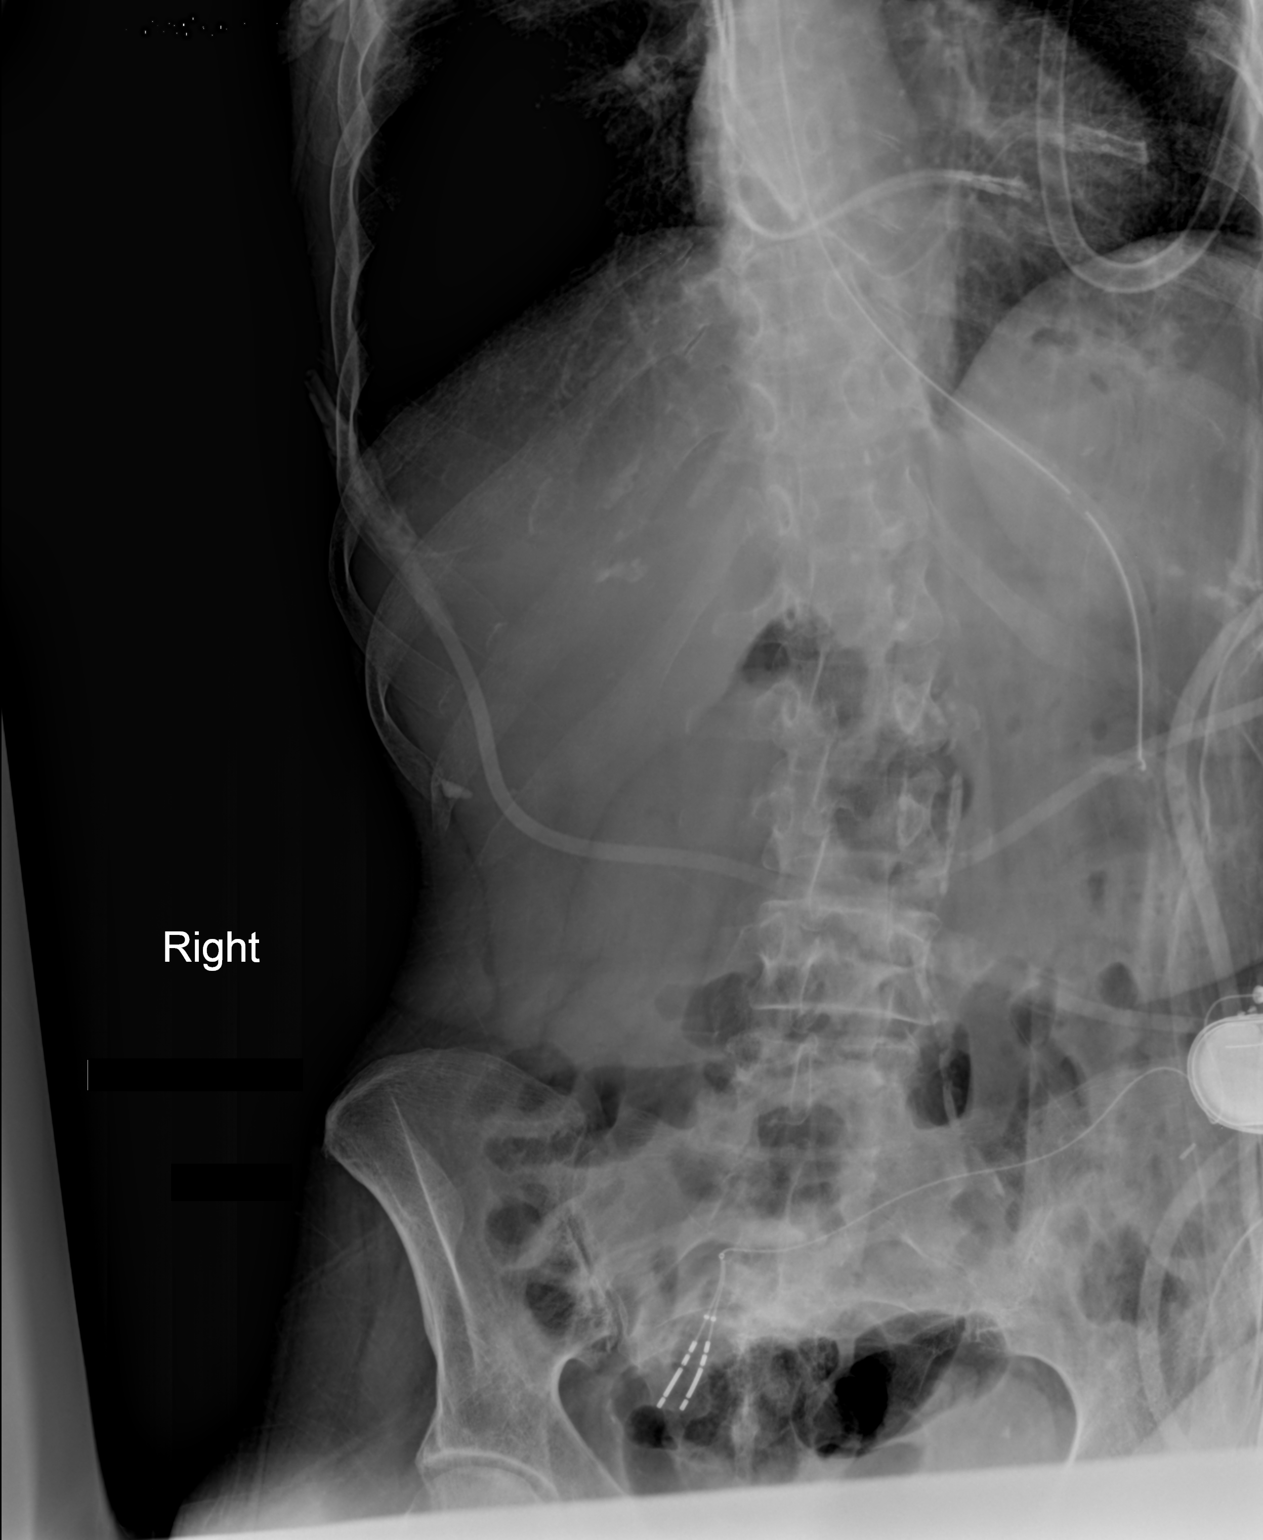

[2 of 2 positions shown; findings below may reference images not displayed]

FINDINGS: Nasogastric tube has been placed with the tip in the
region of the gastric body and the side hole at the gastric fundus.

Decrease in small bowel obstructive pattern.  No free
intraperitoneal air.

Pacemaker is in place.
IMPRESSION: Nasogastric tube placed with decrease in the small bowel
obstructive pattern.

## 2011-08-21 ENCOUNTER — Other Ambulatory Visit: Payer: Self-pay | Admitting: Internal Medicine

## 2011-08-21 IMAGING — CR DG ABDOMEN 2V
2 series · 2 of 2 positions shown · non-contrast
Comparison: 10/22/2010

CLINICAL DATA: Nausea, vomiting, diarrhea, abdominal pain, loss of
appetite

ABDOMEN - 2 VIEW

[w abdomen upright]
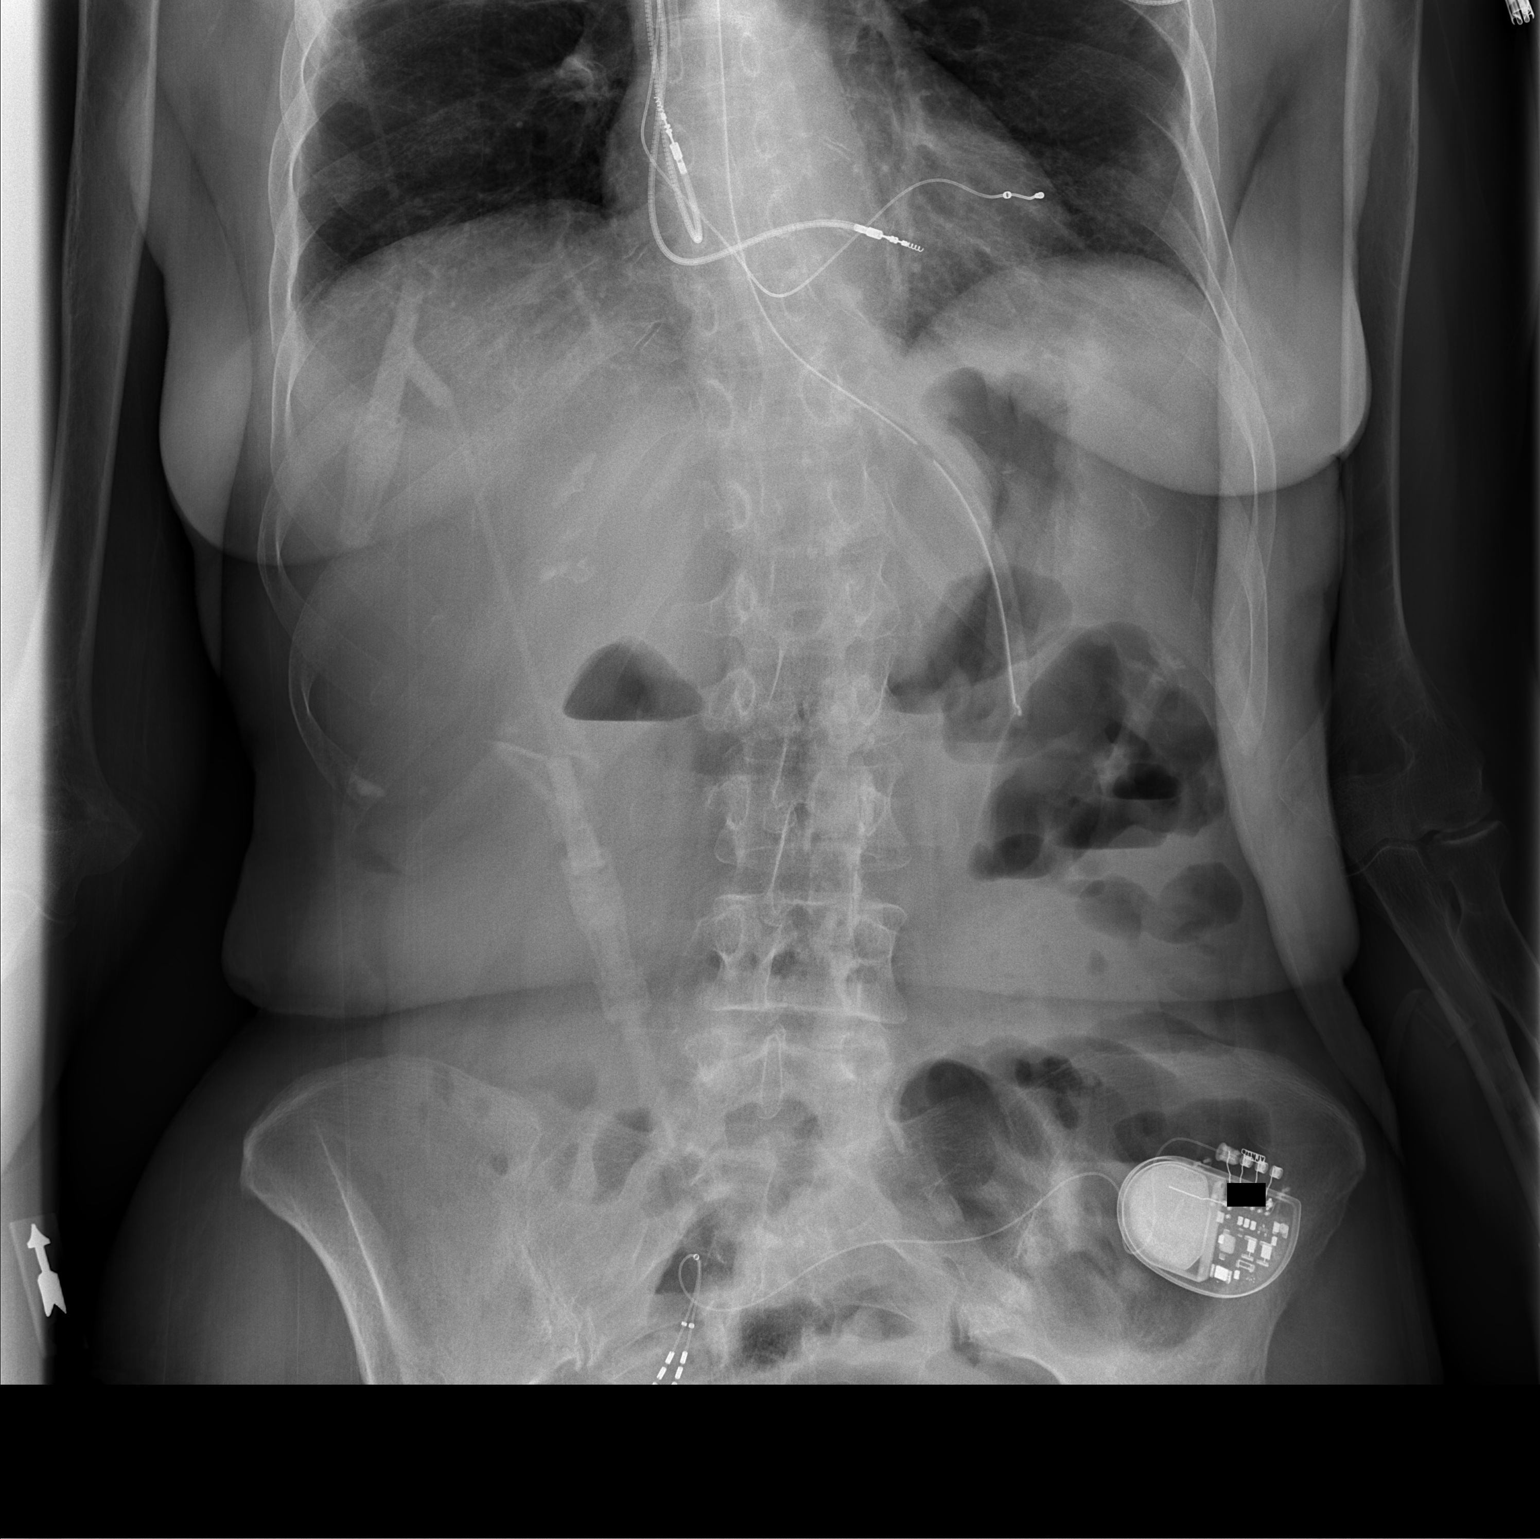

[t abdomen supine]
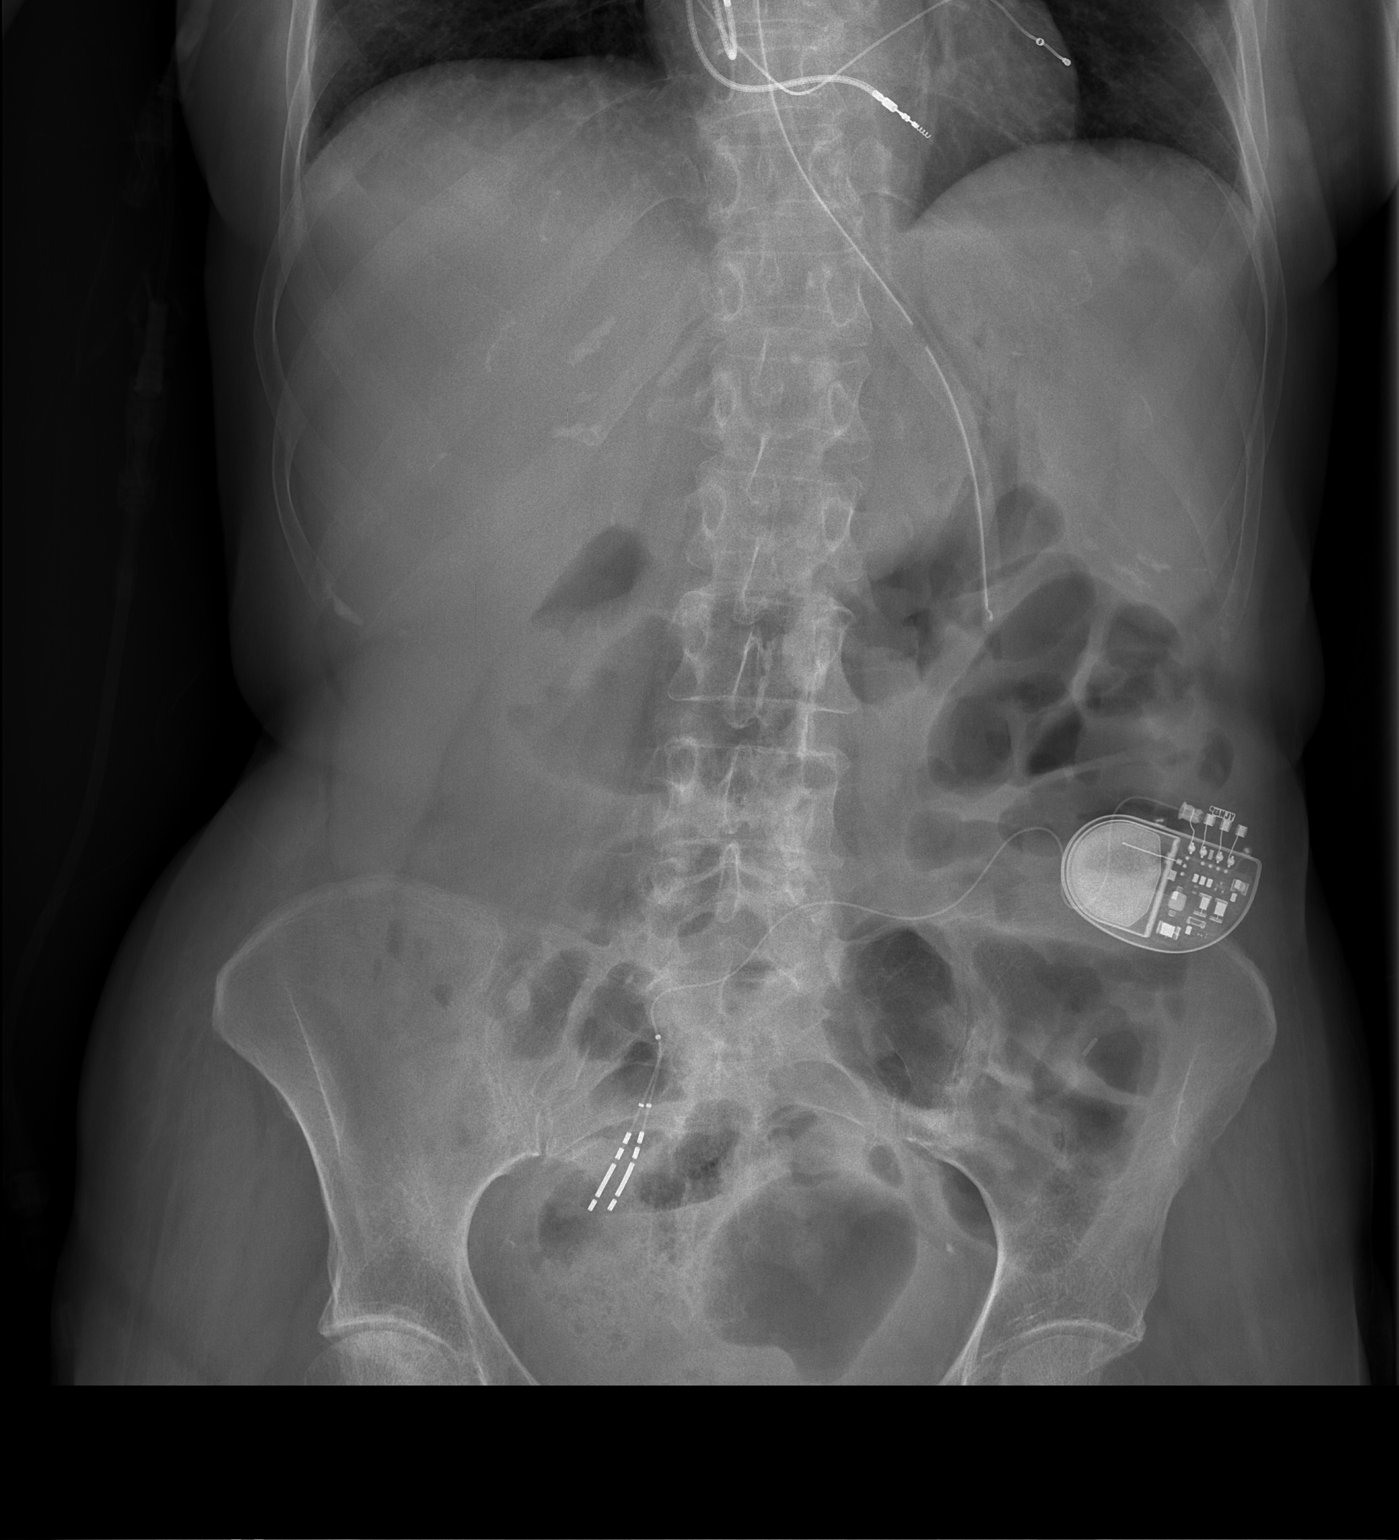

[2 of 2 positions shown; findings below may reference images not displayed]

FINDINGS: NG tube is in the stomach.  Pacer wires present in the
heart.  Normal heart size.  Lung bases clear.  Stimulator device
noted in the sacral region.

Mild nonspecific gaseous distention of small and large bowel.  Air-
fluid levels present diffusely on the upright exam.  No definite
obstruction pattern.  No free air.  No significant interval change.
IMPRESSION: Nonspecific bowel gas pattern with scattered air fluid levels.
This can be seen with ongoing diarrhea.

No definite obstruction pattern or free air.

## 2011-08-22 IMAGING — CR DG ABDOMEN 2V
2 series · 2 of 2 positions shown · non-contrast
Comparison: 10/23/2010

CLINICAL DATA: Small bowel obstruction, nausea, abdominal pain.

ABDOMEN - 2 VIEW

[w abdomen upright]
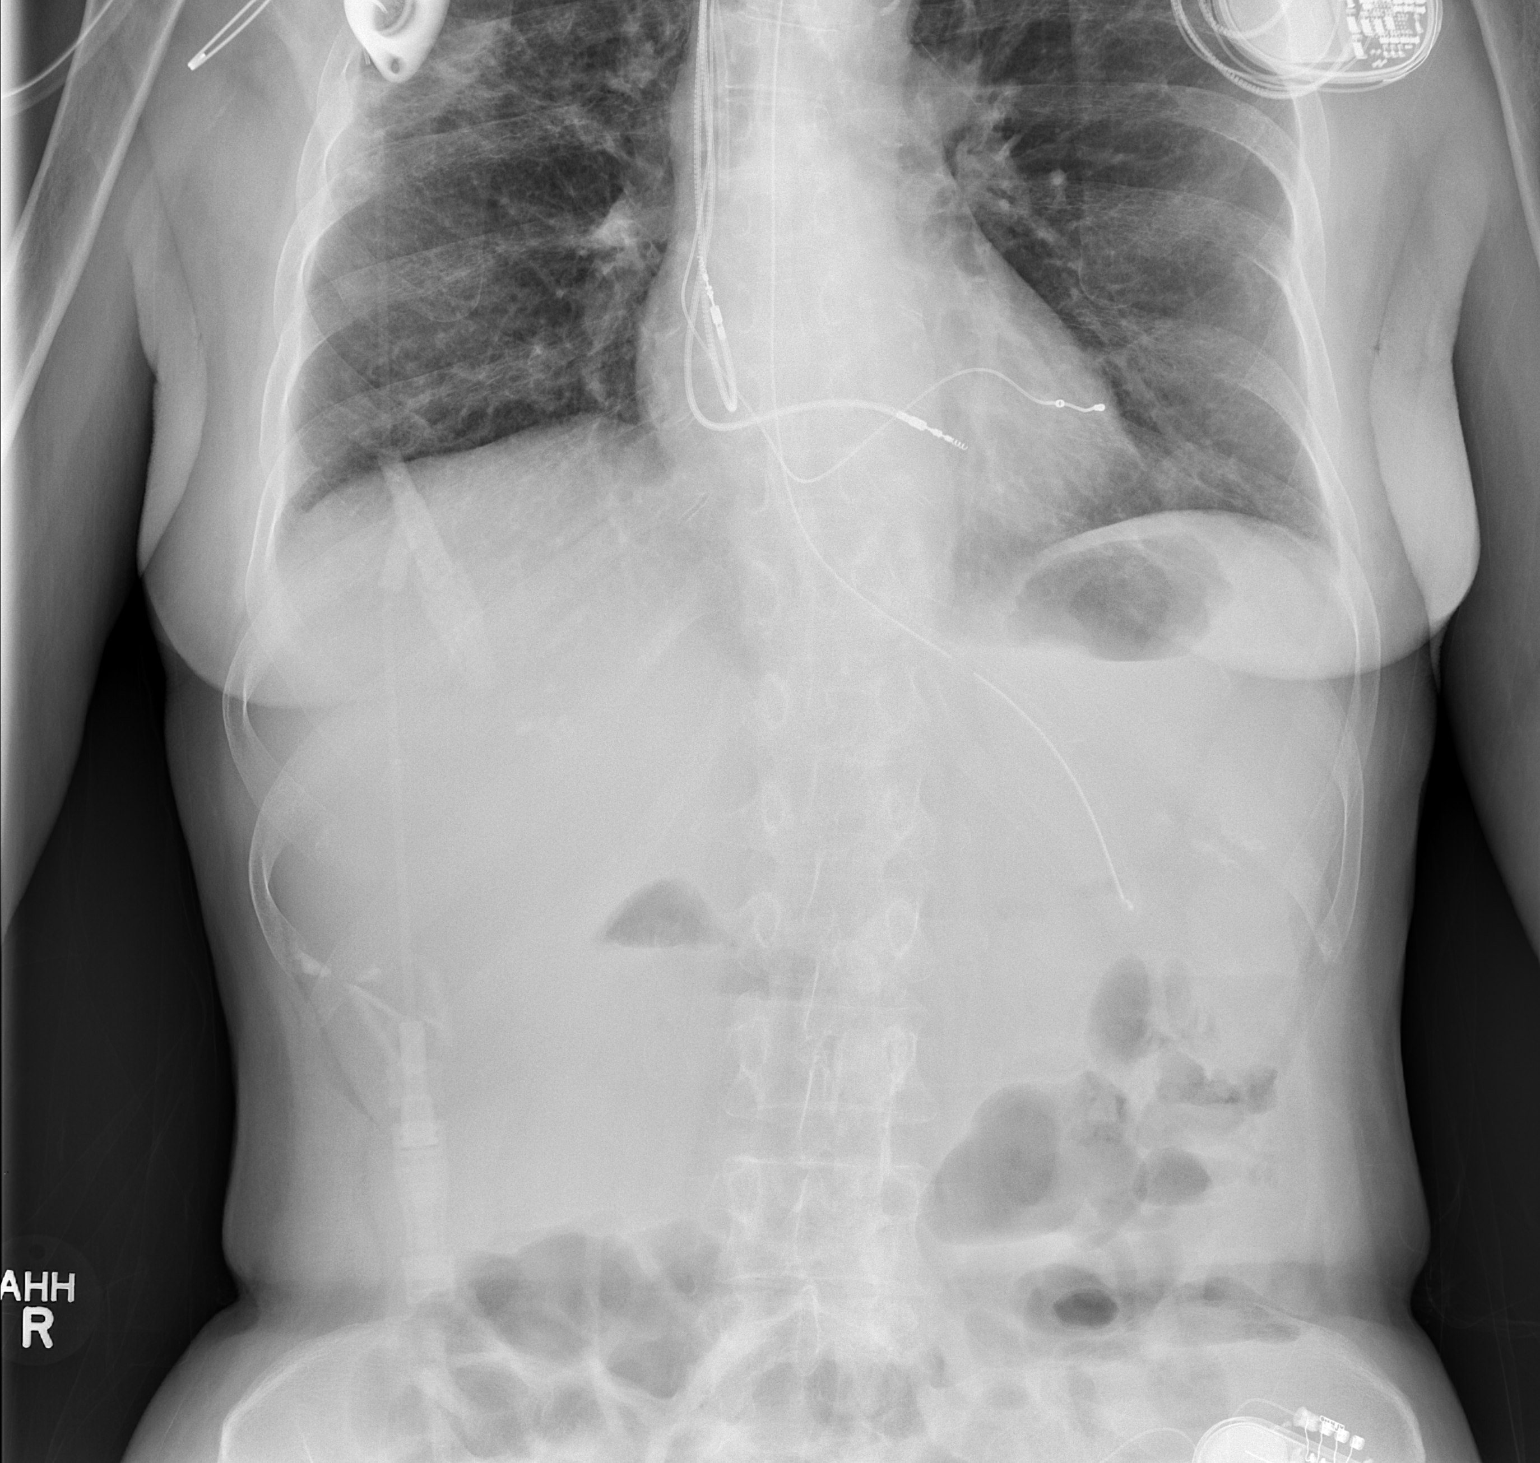

[t abdomen supine]
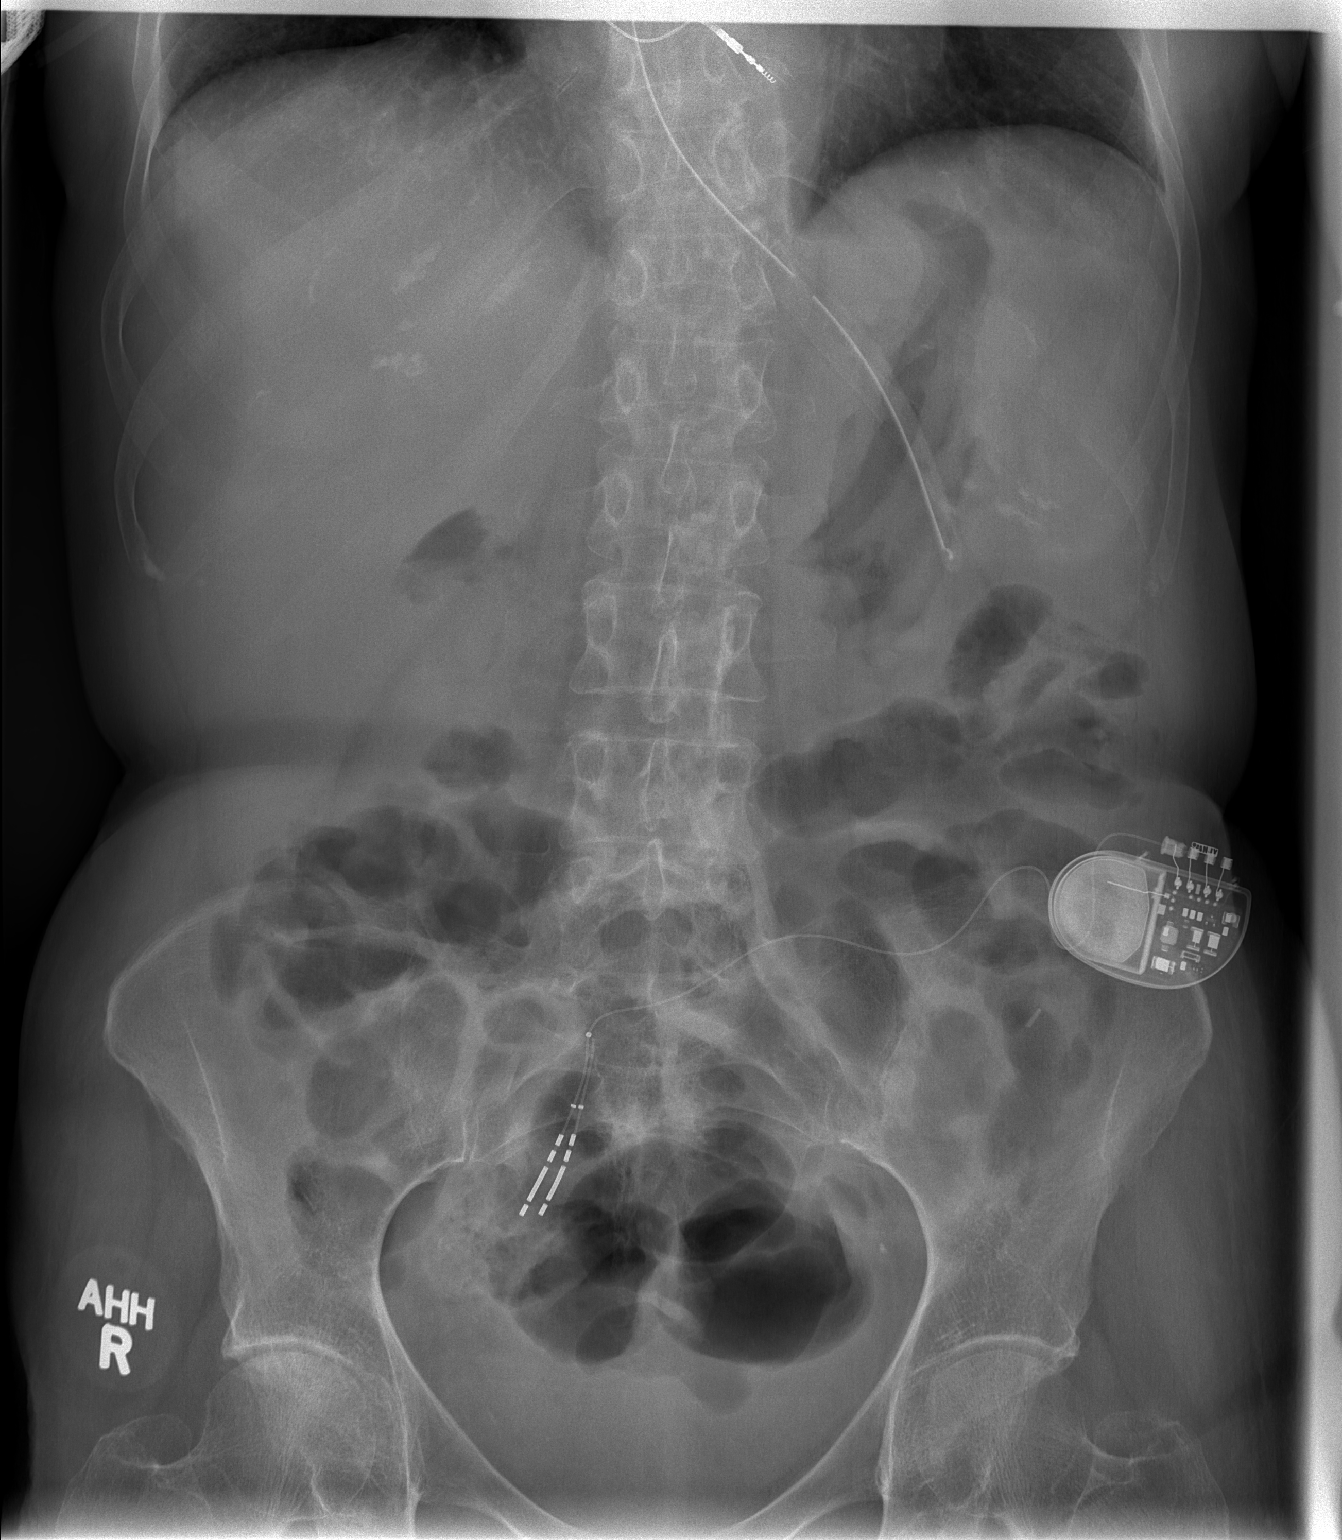

[2 of 2 positions shown; findings below may reference images not displayed]

FINDINGS: Nonspecific bowel gas pattern persists with gas
throughout nondistended large and small bowel.  NG tube is in the
stomach.  No organomegaly or supine evidence of free air.
IMPRESSION: No significant change.

## 2011-08-27 ENCOUNTER — Other Ambulatory Visit: Payer: Self-pay | Admitting: Internal Medicine

## 2011-08-27 NOTE — Telephone Encounter (Signed)
last seen 04/04/11

## 2011-08-28 ENCOUNTER — Telehealth: Payer: Self-pay | Admitting: Family Medicine

## 2011-08-28 DIAGNOSIS — M810 Age-related osteoporosis without current pathological fracture: Secondary | ICD-10-CM

## 2011-08-28 NOTE — Telephone Encounter (Signed)
Wants to go ahead and schedule Bone Density Test, as per letter she received today from [facility] please call at [phone]

## 2011-09-03 ENCOUNTER — Ambulatory Visit (INDEPENDENT_AMBULATORY_CARE_PROVIDER_SITE_OTHER): Payer: Medicare Other | Admitting: Internal Medicine

## 2011-09-03 DIAGNOSIS — E538 Deficiency of other specified B group vitamins: Secondary | ICD-10-CM

## 2011-09-07 ENCOUNTER — Ambulatory Visit (INDEPENDENT_AMBULATORY_CARE_PROVIDER_SITE_OTHER): Payer: Medicare Other | Admitting: Internal Medicine

## 2011-09-07 ENCOUNTER — Other Ambulatory Visit (INDEPENDENT_AMBULATORY_CARE_PROVIDER_SITE_OTHER): Payer: Medicare Other

## 2011-09-07 ENCOUNTER — Encounter: Payer: Self-pay | Admitting: Internal Medicine

## 2011-09-07 VITALS — BP 110/60 | HR 78 | Ht 62.0 in | Wt 102.0 lb

## 2011-09-07 DIAGNOSIS — K56609 Unspecified intestinal obstruction, unspecified as to partial versus complete obstruction: Secondary | ICD-10-CM

## 2011-09-07 DIAGNOSIS — D509 Iron deficiency anemia, unspecified: Secondary | ICD-10-CM

## 2011-09-07 LAB — CBC WITH DIFFERENTIAL/PLATELET
Basophils Absolute: 0.2 10*3/uL — ABNORMAL HIGH (ref 0.0–0.1)
Basophils Relative: 1.5 % (ref 0.0–3.0)
Eosinophils Absolute: 0.3 10*3/uL (ref 0.0–0.7)
Eosinophils Relative: 2.7 % (ref 0.0–5.0)
HCT: 30.1 % — ABNORMAL LOW (ref 36.0–46.0)
Hemoglobin: 9.9 g/dL — ABNORMAL LOW (ref 12.0–15.0)
Lymphocytes Relative: 6.6 % — ABNORMAL LOW (ref 12.0–46.0)
Lymphs Abs: 0.8 10*3/uL (ref 0.7–4.0)
MCHC: 32.8 g/dL (ref 30.0–36.0)
MCV: 89.4 fl (ref 78.0–100.0)
Monocytes Absolute: 0.8 10*3/uL (ref 0.1–1.0)
Monocytes Relative: 7.1 % (ref 3.0–12.0)
Neutro Abs: 9.5 10*3/uL — ABNORMAL HIGH (ref 1.4–7.7)
Neutrophils Relative %: 82.1 % — ABNORMAL HIGH (ref 43.0–77.0)
Platelets: 323 10*3/uL (ref 150.0–400.0)
RBC: 3.37 Mil/uL — ABNORMAL LOW (ref 3.87–5.11)
RDW: 14.8 % — ABNORMAL HIGH (ref 11.5–14.6)
WBC: 11.5 10*3/uL — ABNORMAL HIGH (ref 4.5–10.5)

## 2011-09-07 MED ORDER — PANTOPRAZOLE SODIUM 40 MG PO TBEC: 40.0000 mg | DELAYED_RELEASE_TABLET | Freq: Every day | ORAL | Status: DC

## 2011-09-07 NOTE — Progress Notes (Signed)
[name] [birth year] MRN [id number]   History of Present Illness:  This is a 66 year old white female who is post right hip replacement and is now doing very well. 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 Her last colonoscopy in October 2012 confirmed a stricture and prior subtotal colectomy. She had an exploratory laparotomy in February 2011 to attempt lysis of adhesions. She had recent closure of the gastrocutaneous fistula following PEG removal. Her home TNA was discontinued in April 2013. She was scheduled for an iron infusion but it did not take place because she lost her venous access. She is doing very well. She denies diarrhea or constipation. She has been eating reasonably well and her weight remains around 102 pounds which is very good for her. A recent upper abdominal ultrasound showed no significant hydronephrosis and chronic intra-and extrahepatic duct dilatation.    Past Medical History  Diagnosis Date  . GERD (gastroesophageal reflux disease)   . CAD (coronary artery disease)     mild disease per cath in 2011  . LBBB (left bundle branch block)   . Atrial fibrillation     on amiodarone  . Venous embolism and thrombosis of subclavian vein     after pacemaker insertion Oct 2011  . Pleural effusion      s/p right thoracentesis 03/09  . Small bowel obstruction   . Urinary incontinence      -INTERSTIM IMPLANT NOT FUNCTIONING PER PT  . RLS (restless legs syndrome)   . Primary dilated cardiomyopathy     EF 45 to 50% per echo in Jan 2012  . Vitamin B12 deficiency   . Gastroparesis   . Chronic nausea   . Chronic abdominal pain   . Chronic pain syndrome   . Pacemaker     CRT therapy; followed by [name]  . H/O: GI bleed     from Pradaxa  . Oxygen dependent     2 liters via nasal cannula at all times  . CHF (congestive heart failure)   . Anemia   . Hx of stress fx  aug 2011    right hip   . Elbow fracture, left aug 2012  . Arthritis     right hip  . Hx of gastric ulcer   . Diverticulitis   . HTN (hypertension)     under control; has been on med. x "years"  . COPD (chronic obstructive pulmonary disease)     continuous O2  . Hx MRSA infection   . Pneumonia - 03/09, 12/09, 02/10, 06/10    MOST RECENT FEB 2013  . Fibromyalgia     COUPLE OF TIMES A YEAR-PT HAS EPISODES OF CONFUSION-USUALLY INVOLVES DAY/NIGHT REVERSAL AND EPISODES OF TWITCHING AND FALLS--SEES [name] - NEUROLOGIST-LAST SEEN 07/10/11  . Elevated liver enzymes   . Blood transfusion    Past Surgical History  Procedure Date  . Appendectomy   . Pacemaker placement 12/21/2009  . Peg removed     with complications  . Tonsillectomy age 8  . Interstim implant revision 03/06/2011    Procedure: REVISION OF [name];  Surgeon: Becky Sinner, MD;  Location: [name];  Service: Urology;  Laterality: N/A;  Replacement of Neurostimulator  . Portacath placement 12/16/2009  . Total abdominal hysterectomy     complete  . Interstim implant placement 05/28/2006 - stage I    06/05/2006 - stage II  . Interstim implant revision  10/23/2007  . Cystoscopy with injection 04/30/2006    transurethral collagen injection; incision vaginal stenosis  . Small intestine surgery 05/20/2001    ex. lap., resection of small bowel stricture; gastrostomy; insertion central line  . Cardiac catheterization 04/08/2006, 11/16/2009  . Cystoscopy 11/11/2008  . Cystoscopy w/ retrogrades 11/11/2008    right  . Colectomy     Intestinal resection (5times)  . Colectomy 02/04/2000    ex. lap., intra-abd. subtotal colectomy with ileosigmoid colon anastomosies and lysis of adhesions  . Gastrocutaneous fistula closure   . Exploratory laparotomy 04/27/2009    lysis of adhesions, gastrostomy tube  . Port-a-cath removal 07/27/2011    Procedure: REMOVAL PORT-A-CATH;  Surgeon: Becky Loron, MD;  Location: [facility];   Service: General;  Laterality: N/A;  . Total hip arthroplasty 08/03/2011    Procedure: TOTAL HIP ARTHROPLASTY ANTERIOR APPROACH;  Surgeon: Becky Hitch, MD;  Location: [name];  Service: Orthopedics;  Laterality: Right;    reports that she quit smoking about 25 years ago. She has never used smokeless tobacco. She reports that she does not drink alcohol or use illicit drugs. family history includes Breast cancer in her maternal aunt; Diabetes in her paternal grandfather; Heart disease in her brother, father, mother, and paternal aunt; and Stroke in her mother.  There is no history of Colon cancer. Allergies  Allergen Reactions  . Dabigatran Etexilate Mesylate Other (See Comments)    INTERNAL BLEEDING  . Pentazocine Lactate Other (See Comments)    HALLUCINATIONS  . Sulfonamide Derivatives Other (See Comments)    JITTERINESS         Review of Systems: Denies dysphagia. Positive for reflux  The remainder of the 10 point ROS is negative except as outlined in H&P   Physical Exam: General appearance  Well developed, in no distress. Chronically ill-appearing. Nasal oxygen Eyes- non icteric. HEENT nontraumatic, normocephalic. Mouth no lesions, tongue papillated, no cheilosis. Neck supple without adenopathy, thyroid not enlarged, no carotid bruits, no JVD. Lungs Clear to auscultation bilaterally fine crackles in the right lung. Cor normal S1, normal S2, regular rhythm, no murmur,  quiet precordium. Abdomen: Tenderness but nontender. Hyperactive bowel sounds. Multiple surgical scars. Rectal: Not done. Extremities trace pedal edema. Skin no lesions. Neurological alert and oriented x 3. Psychological normal mood and affect.  Assessment and Plan:  Problem #1 Patient is post recent hip replacement with satisfactory postoperative recovery. We will recheck her CBC today to see whether we should go ahead with the iron infusion.  Problem #2 Small bowel obstruction. Patient is  currently doing well with no obstructive symptoms.  Problem #4 Failure to thrive. Weight is stable. We will see her in 3 months.   09/07/2011 [name]

## 2011-09-07 NOTE — Patient Instructions (Signed)
We have sent the following medications to your pharmacy for you to pick up at your convenience: Pantoprazole Your physician has requested that you go to the basement for the following lab work before leaving today: CBC CC: Dr Loreen Freud

## 2011-09-10 ENCOUNTER — Other Ambulatory Visit: Payer: Self-pay | Admitting: *Deleted

## 2011-09-10 ENCOUNTER — Other Ambulatory Visit (HOSPITAL_COMMUNITY): Payer: Self-pay | Admitting: *Deleted

## 2011-09-10 ENCOUNTER — Encounter: Payer: Self-pay | Admitting: Cardiology

## 2011-09-10 ENCOUNTER — Telehealth: Payer: Self-pay | Admitting: Internal Medicine

## 2011-09-10 NOTE — Telephone Encounter (Signed)
Spoke with short stay [name]) and scheduled patient on 09/12/11 at 8:00 AM for Infed. Orders in EPIC. Patient given date and time.

## 2011-09-12 ENCOUNTER — Encounter (HOSPITAL_COMMUNITY)
Admission: RE | Admit: 2011-09-12 | Discharge: 2011-09-12 | Disposition: A | Discharge status: Home / Self Care | Payer: Medicare Other | Source: Ambulatory Visit | Attending: Internal Medicine | Admitting: Internal Medicine

## 2011-09-12 ENCOUNTER — Encounter (HOSPITAL_COMMUNITY): Payer: Self-pay

## 2011-09-12 DIAGNOSIS — D649 Anemia, unspecified: Secondary | ICD-10-CM | POA: Insufficient documentation

## 2011-09-12 MED ORDER — SODIUM CHLORIDE 0.9 % IV SOLN
Freq: Once | INTRAVENOUS | Status: AC
  Administered 2011-09-12: 250 mL via INTRAVENOUS

## 2011-09-12 MED ORDER — SODIUM CHLORIDE 0.9 % IV SOLN
1000.0000 mg | Freq: Once | INTRAVENOUS | Status: AC
  Administered 2011-09-12: 1000 mg via INTRAVENOUS
  Filled 2011-09-12: qty 20

## 2011-09-12 MED ORDER — SODIUM CHLORIDE 0.9 % IV SOLN
25.0000 mg | Freq: Once | INTRAVENOUS | Status: AC
  Administered 2011-09-12: 25 mg via INTRAVENOUS
  Filled 2011-09-12: qty 0.5

## 2011-09-12 NOTE — Discharge Instructions (Signed)

## 2011-09-15 ENCOUNTER — Other Ambulatory Visit: Payer: Self-pay | Admitting: Family Medicine

## 2011-09-17 ENCOUNTER — Other Ambulatory Visit: Payer: Self-pay | Admitting: *Deleted

## 2011-09-17 DIAGNOSIS — R52 Pain, unspecified: Secondary | ICD-10-CM

## 2011-09-17 MED ORDER — OXYCODONE-ACETAMINOPHEN 10-325 MG PO TABS: 1.0000 | ORAL_TABLET | ORAL | Status: DC | PRN

## 2011-09-17 MED ORDER — FENTANYL 50 MCG/HR TD PT72: 1.0000 | MEDICATED_PATCH | TRANSDERMAL | Status: DC

## 2011-09-17 NOTE — Telephone Encounter (Signed)
Last seen 04/04/11 and filled 08/17/11 # 60. Please advise    KP

## 2011-09-17 NOTE — Telephone Encounter (Signed)
Last OV 04-04-11, Last Filled 08-17-11 #120, last filled # 10

## 2011-09-17 NOTE — Telephone Encounter (Signed)
Ok to refill both?? 

## 2011-09-17 NOTE — Telephone Encounter (Signed)
Patient aware Rx ready for pick up.      KP 

## 2011-09-25 ENCOUNTER — Other Ambulatory Visit: Payer: Self-pay | Admitting: Family Medicine

## 2011-09-25 NOTE — Telephone Encounter (Signed)
Last OV 09-07-11, last filled 08-27-11 #60

## 2011-09-26 ENCOUNTER — Encounter: Payer: Self-pay | Admitting: Pulmonary Disease

## 2011-09-26 ENCOUNTER — Ambulatory Visit (INDEPENDENT_AMBULATORY_CARE_PROVIDER_SITE_OTHER): Payer: Medicare Other | Admitting: Pulmonary Disease

## 2011-09-26 VITALS — BP 116/68 | HR 89 | Temp 98.0°F | Ht 62.0 in | Wt 97.4 lb

## 2011-09-26 DIAGNOSIS — I4891 Unspecified atrial fibrillation: Secondary | ICD-10-CM

## 2011-09-26 DIAGNOSIS — J189 Pneumonia, unspecified organism: Secondary | ICD-10-CM

## 2011-09-26 DIAGNOSIS — J9611 Chronic respiratory failure with hypoxia: Secondary | ICD-10-CM

## 2011-09-26 DIAGNOSIS — J9610 Chronic respiratory failure, unspecified whether with hypoxia or hypercapnia: Secondary | ICD-10-CM

## 2011-09-26 DIAGNOSIS — J449 Chronic obstructive pulmonary disease, unspecified: Secondary | ICD-10-CM

## 2011-09-26 NOTE — Progress Notes (Signed)
Chief Complaint  Patient presents with  . Follow-up    Pt states if she is off the oxygen she is more SOB. she has very little dry cough. denies any wheezing, chest tx. She is still using oxygen 2l at bedtime    History of Present Illness: [name] is a 66 y.o. female with history of COPD/emphysema, chronic hypoxemic respiratory failure, and recurrent pneumonia with focal areas of bronchiectasis.  She had hip replacement in May 2013.  She has been doing well.  She is surprised how much better she feels.  She is still using a cane when she goes out, but not so much at home.  She had trouble with her breathing in May and June.  She thinks this was related to the weather.  Since then she has been doing better. She denies wheeze, or sputum production.  She has not had fever recently.  She is not using albuterol much at present.  She uses spiriva daily.  She notices her activity level is worse if she does not use her oxygen.   Past Medical History  Diagnosis Date  . GERD (gastroesophageal reflux disease)   . CAD (coronary artery disease)     mild disease per cath in 2011  . LBBB (left bundle branch block)   . Atrial fibrillation     on amiodarone  . Venous embolism and thrombosis of subclavian vein     after pacemaker insertion Oct 2011  . Pleural effusion      s/p right thoracentesis 03/09  . Small bowel obstruction   . Urinary incontinence      -INTERSTIM IMPLANT NOT FUNCTIONING PER PT  . RLS (restless legs syndrome)   . Primary dilated cardiomyopathy     EF 45 to 50% per echo in Jan 2012  . Vitamin B12 deficiency   . Gastroparesis   . Chronic nausea   . Chronic abdominal pain   . Chronic pain syndrome   . Pacemaker     CRT therapy; followed by Dr. Graciela Husbands  . H/O: GI bleed     from Pradaxa  . Oxygen dependent     2 liters via nasal cannula at all times  . CHF (congestive heart failure)   . Anemia   . Hx of stress fx aug 2011    right hip   . Elbow fracture, left aug  2012  . Arthritis     right hip  . Hx of gastric ulcer   . Diverticulitis   . HTN (hypertension)     under control; has been on med. x "years"  . COPD (chronic obstructive pulmonary disease)     continuous O2  . Hx MRSA infection   . Pneumonia - 03/09, 12/09, 02/10, 06/10    MOST RECENT FEB 2013  . Elevated liver enzymes   . Blood transfusion   . Anginal pain   . Shortness of breath   . Fibromyalgia     COUPLE OF TIMES A YEAR-PT HAS EPISODES OF CONFUSION-USUALLY INVOLVES DAY/NIGHT REVERSAL AND EPISODES OF TWITCHING AND FALLS--SEES DR. Modesto Charon - NEUROLOGIST-LAST SEEN 07/10/11    Past Surgical History  Procedure Date  . Appendectomy   . Pacemaker placement 12/21/2009  . Peg removed     with complications  . Tonsillectomy age 30  . Interstim implant revision 03/06/2011    Procedure: REVISION OF Leane Platt;  Surgeon: Martina Sinner, MD;  Location: [facility];  Service: Urology;  Laterality: N/A;  Replacement of Neurostimulator  .  Portacath placement 12/16/2009  . Total abdominal hysterectomy     complete  . Interstim implant placement 05/28/2006 - stage I    06/05/2006 - stage II  . Interstim implant revision 10/23/2007  . Cystoscopy with injection 04/30/2006    transurethral collagen injection; incision vaginal stenosis  . Small intestine surgery 05/20/2001    ex. lap., resection of small bowel stricture; gastrostomy; insertion central line  . Cardiac catheterization 04/08/2006, 11/16/2009  . Cystoscopy 11/11/2008  . Cystoscopy w/ retrogrades 11/11/2008    right  . Colectomy     Intestinal resection (5times)  . Colectomy 02/04/2000    ex. lap., intra-abd. subtotal colectomy with ileosigmoid colon anastomosies and lysis of adhesions  . Gastrocutaneous fistula closure   . Exploratory laparotomy 04/27/2009    lysis of adhesions, gastrostomy tube  . Port-a-cath removal 07/27/2011    Procedure: REMOVAL PORT-A-CATH;  Surgeon: Emelia Loron, MD;  Location: [facility];  Service:  General;  Laterality: N/A;  . Total hip arthroplasty 08/03/2011    Procedure: TOTAL HIP ARTHROPLASTY ANTERIOR APPROACH;  Surgeon: Kathryne Hitch, MD;  Location: [facility];  Service: Orthopedics;  Laterality: Right;  . Joint replacement 5/13    right total hip    Allergies  Allergen Reactions  . Dabigatran Etexilate Mesylate Other (See Comments)    INTERNAL BLEEDING  . Talwin (Pentazocine) Other (See Comments)    hallucinations  . Pentazocine Lactate Other (See Comments)    HALLUCINATIONS  . Sulfonamide Derivatives Other (See Comments)    JITTERINESS     Physical Exam:  Blood pressure 116/68, pulse 89, temperature 98 F (36.7 C), temperature source Oral, height 5\' 2"  (1.575 m), weight 97 lb 6.4 oz (44.18 kg), SpO2 92.00%.  Body mass index is 17.81 kg/(m^2).  Wt Readings from Last 2 Encounters:  09/26/11 97 lb 6.4 oz (44.18 kg)  09/12/11 98 lb 6 oz (44.623 kg)    General - Thin, wearing supplemental oxygen HEENT - no sinus tenderness, no oral exudate, no LAN Cardiac - s1s2 regular Chest - prolonged exhalation, normal respiratory excursion, no wheeze, right mid-lung field expiratory squeak Abdomen - soft, non-tender Extremities - no edema Skin - no rashes Neurologic - normal strength Psychiatric - normal mood, behavior  CHEST - 2 VIEW 07/30/11 Comparison: Chest x-ray 05/22/2011, chest CT 05/04/2011  Findings: Left basilar nodular shadows are more prominent than on the prior study. These correspond to subacute healing rib fractures based on the prior chest CT which showed left anterior rib fractures and callus formation. Right lower lobe airspace disease has improved since the prior study. There is some residual right apical density which is most likely scarring.  Negative for heart failure or effusion. Port-A-Cath has been removed since the prior study. Three lead pacer is unchanged.  Heart size is normal.  IMPRESSION:  Healing left anterior rib fractures are present,  accounting for nodular densities in the left lung base.  Improvement in right lower lobe infiltrate compared with the prior chest CT.  Residual right upper lobe density may be infiltrate or scarring.  Original Report Authenticated By: Camelia Phenes, M.D.  Assessment/Plan:  Outpatient Encounter Prescriptions as of 09/26/2011  Medication Sig Dispense Refill  . albuterol (PROAIR HFA) 108 (90 BASE) MCG/ACT inhaler Inhale 2 puffs into the lungs every 6 (six) hours as needed for wheezing.  1 Inhaler  5  . amiodarone (PACERONE) 200 MG tablet Take 1 tablet (200 mg total) by mouth every morning.  30 tablet  6  .  carvedilol (COREG) 6.25 MG tablet Take 1 tablet (6.25 mg total) by mouth 2 (two) times daily with a meal.  60 tablet  4  . cyclobenzaprine (FLEXERIL) 10 MG tablet TAKE 1 TABLET BY MOUTH THREE TIMES DAILY AS NEEDED FOR MUSCLE SPASMS  60 tablet  0  . denosumab (PROLIA) 60 MG/ML SOLN Inject 60 mg into the skin every 6 (six) months. Twice a year      . fentaNYL (DURAGESIC - DOSED MCG/HR) 50 MCG/HR Place 1 patch (50 mcg total) onto the skin every 3 (three) days.  10 patch  0  . furosemide (LASIX) 20 MG tablet Take 1 tablet (20 mg total) by mouth every morning.  30 tablet  6  . gabapentin (NEURONTIN) 600 MG tablet Take 1 tablet (600 mg total) by mouth 4 (four) times daily.  120 tablet  2  . nitroGLYCERIN (NITROSTAT) 0.4 MG SL tablet Place 1 tablet (0.4 mg total) under the tongue every 5 (five) minutes as needed for chest pain. CHEST PAIN   25 tablet  11  . oxyCODONE-acetaminophen (PERCOCET) 10-325 MG per tablet Take 1 tablet by mouth every 4 (four) hours as needed. PAIN  120 tablet  0  . pantoprazole (PROTONIX) 40 MG tablet Take 1 tablet (40 mg total) by mouth daily.  30 tablet  3  . promethazine (PHENERGAN) 25 MG tablet TAKE 1 TABLET BY MOUTH EVERY 8 HOURS AS NEEDED FOR NAUSEA  40 tablet  0  . temazepam (RESTORIL) 15 MG capsule TAKE 1 TO 2 CAPSULES BY MOUTH EVERY NIGHT AT BEDTIME AS NEEDED FOR SLEEP   60 capsule  0  . tiotropium (SPIRIVA HANDIHALER) 18 MCG inhalation capsule Place 1 capsule (18 mcg total) into inhaler and inhale every morning.  30 capsule  5    Damaya Channing Pager:  [phone] 09/26/2011, 12:06 PM

## 2011-09-26 NOTE — Assessment & Plan Note (Signed)
Continue supplemental oxygen. 

## 2011-09-26 NOTE — Assessment & Plan Note (Signed)
She is on chronic amiodarone therapy.  Will review PFT's.

## 2011-09-26 NOTE — Assessment & Plan Note (Signed)
Concern has been for recurrent aspiration.  Improved since episode in Winter 2012.  Monitor clinically.

## 2011-09-26 NOTE — Patient Instructions (Signed)
Will schedule breathing test (PFT) >> will call with results Follow up in 6 months  

## 2011-09-26 NOTE — Assessment & Plan Note (Signed)
She is stable on her current regimen.  Will repeat her PFT's and call her with results.

## 2011-10-10 ENCOUNTER — Ambulatory Visit (INDEPENDENT_AMBULATORY_CARE_PROVIDER_SITE_OTHER): Payer: Medicare Other | Admitting: Pulmonary Disease

## 2011-10-10 ENCOUNTER — Ambulatory Visit (INDEPENDENT_AMBULATORY_CARE_PROVIDER_SITE_OTHER): Payer: Medicare Other | Admitting: Internal Medicine

## 2011-10-10 DIAGNOSIS — J9611 Chronic respiratory failure with hypoxia: Secondary | ICD-10-CM

## 2011-10-10 DIAGNOSIS — E538 Deficiency of other specified B group vitamins: Secondary | ICD-10-CM

## 2011-10-10 DIAGNOSIS — J9610 Chronic respiratory failure, unspecified whether with hypoxia or hypercapnia: Secondary | ICD-10-CM

## 2011-10-10 LAB — PULMONARY FUNCTION TEST

## 2011-10-10 MED ORDER — CYANOCOBALAMIN 1000 MCG/ML IJ SOLN
1000.0000 ug | Freq: Once | INTRAMUSCULAR | Status: AC
  Administered 2011-10-10: 1000 ug via INTRAMUSCULAR

## 2011-10-10 NOTE — Progress Notes (Signed)
PFT done today. 

## 2011-10-11 ENCOUNTER — Other Ambulatory Visit: Payer: Self-pay | Admitting: Family Medicine

## 2011-10-11 ENCOUNTER — Other Ambulatory Visit: Payer: Self-pay | Admitting: Cardiology

## 2011-10-11 IMAGING — CR DG CHEST 2V
2 series · 2 of 2 positions shown · non-contrast
Comparison: 08/31/2010

CLINICAL DATA: Cough, COPD.  Recurrent pneumonia.

CHEST - 2 VIEW

[view not recorded (1 of 2)]
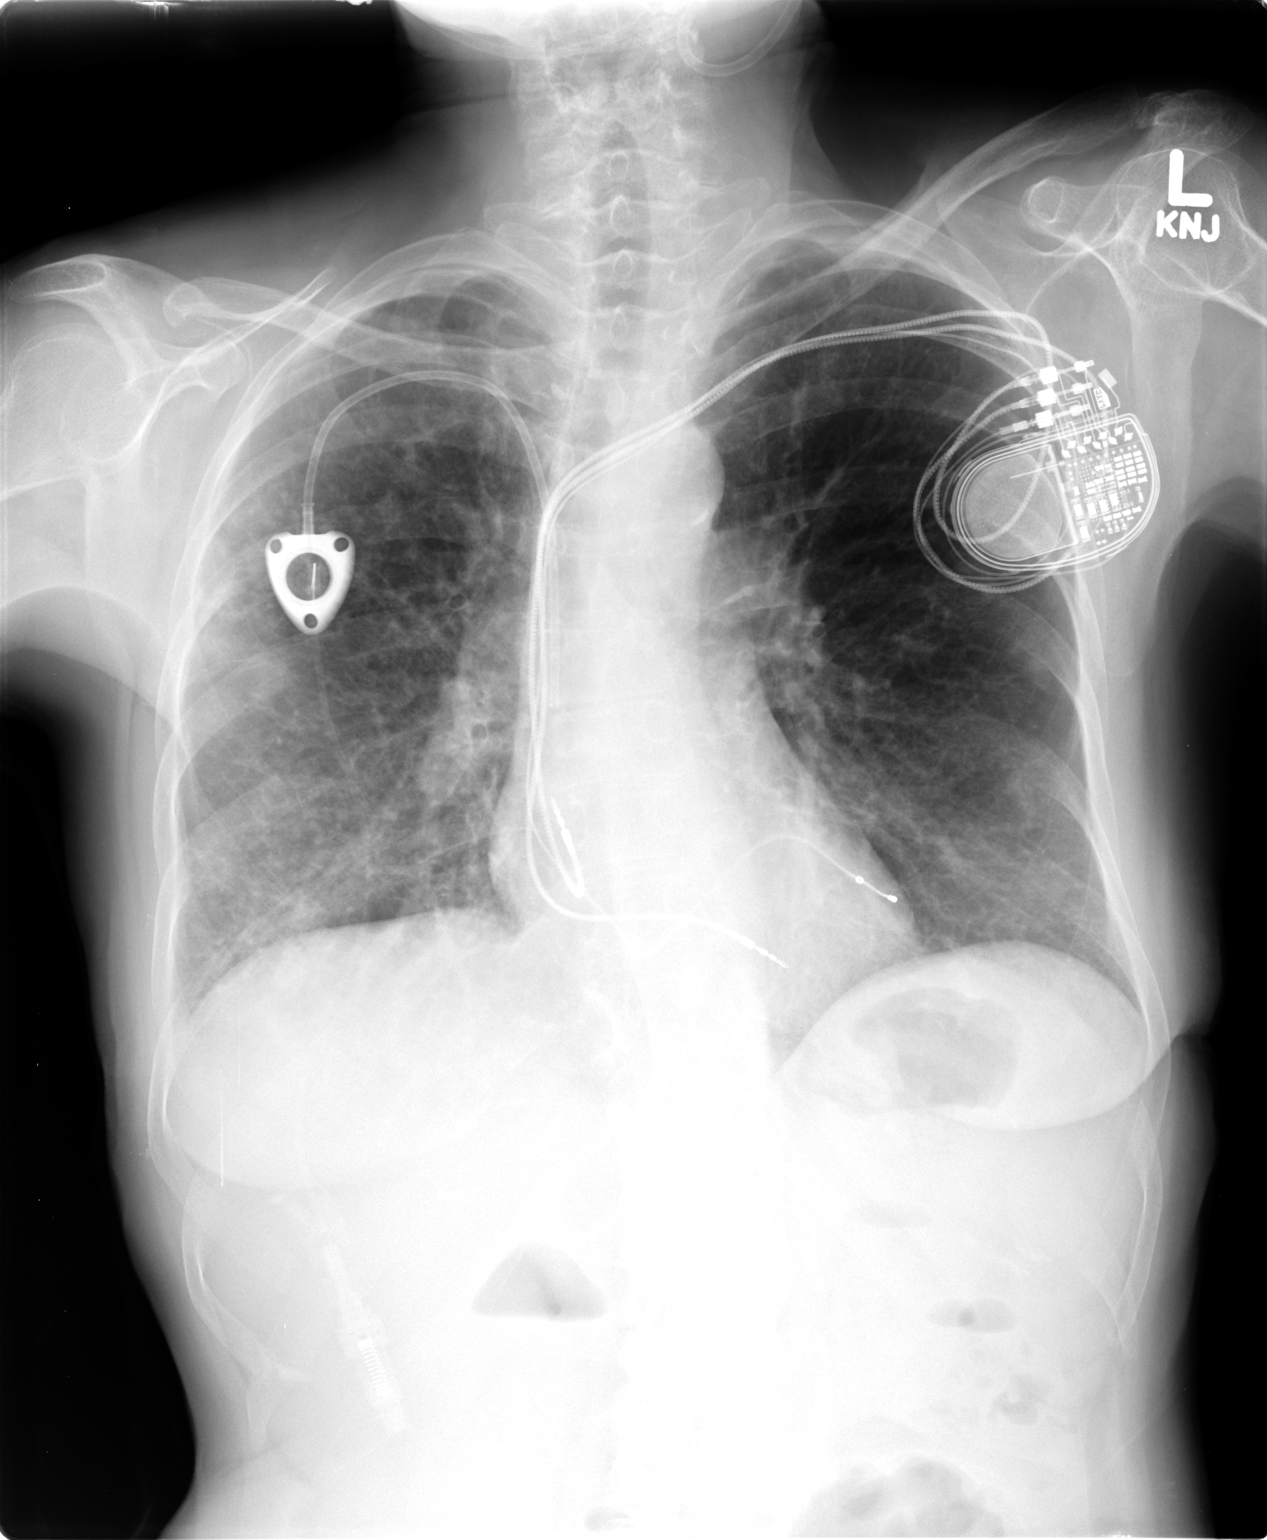

[view not recorded (2 of 2)]
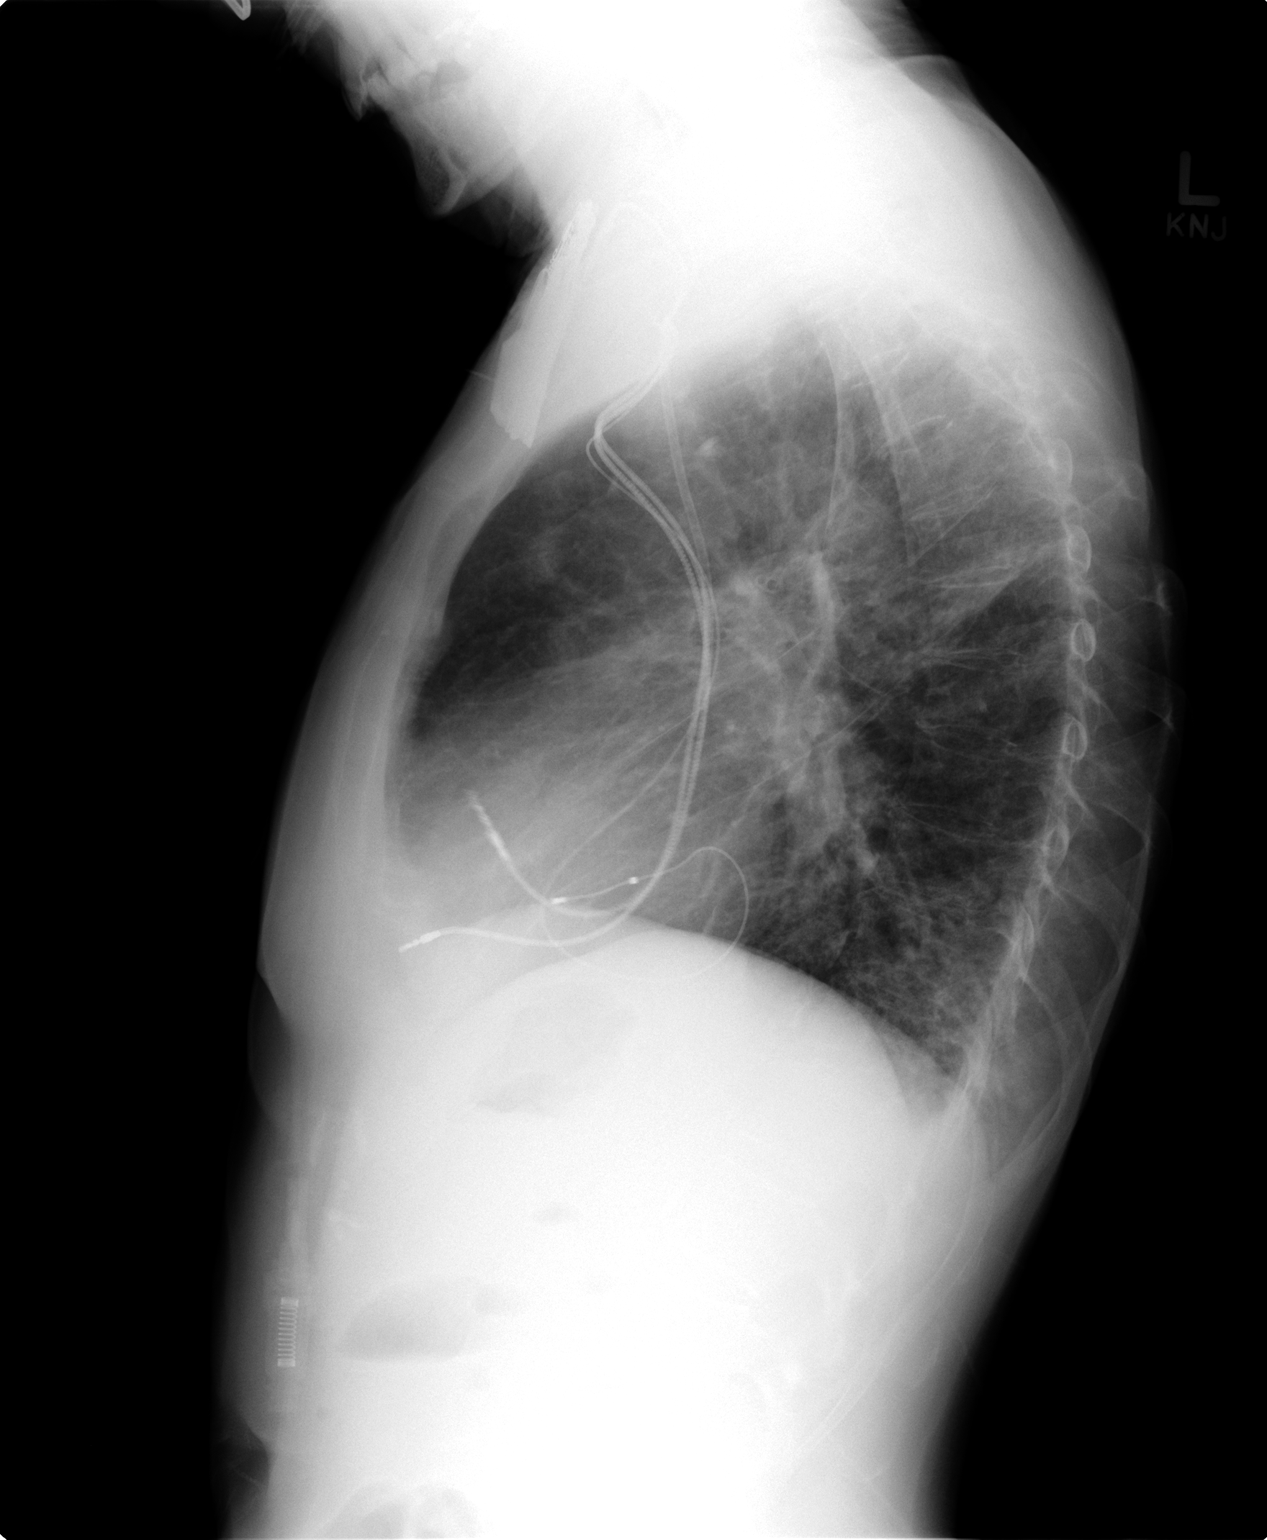

[2 of 2 positions shown; findings below may reference images not displayed]

FINDINGS: Patchy airspace disease throughout the right lung, new
since prior study concerning for pneumonia.  Left lung is clear.
Heart is normal size.  Left pacer and right Port-A-Cath remain in
place, unchanged.
IMPRESSION: Patchy airspace disease throughout the right lung concerning for
pneumonia.

## 2011-10-11 NOTE — Telephone Encounter (Signed)
..   Requested Prescriptions   Pending Prescriptions Disp Refills  . carvedilol (COREG) 6.25 MG tablet [Pharmacy Med Name: CARVEDILOL 6.25MG  TABLETS] 60 tablet 3    Sig: TAKE 1 TABLET BY MOUTH TWICE DAILY WITH A MEAL

## 2011-10-15 ENCOUNTER — Ambulatory Visit (INDEPENDENT_AMBULATORY_CARE_PROVIDER_SITE_OTHER): Payer: Medicare Other | Admitting: Family Medicine

## 2011-10-15 ENCOUNTER — Encounter: Payer: Self-pay | Admitting: Family Medicine

## 2011-10-15 VITALS — BP 100/60 | HR 84 | Temp 98.1°F | Wt 94.0 lb

## 2011-10-15 DIAGNOSIS — H1045 Other chronic allergic conjunctivitis: Secondary | ICD-10-CM

## 2011-10-15 DIAGNOSIS — R52 Pain, unspecified: Secondary | ICD-10-CM

## 2011-10-15 DIAGNOSIS — K56609 Unspecified intestinal obstruction, unspecified as to partial versus complete obstruction: Secondary | ICD-10-CM

## 2011-10-15 DIAGNOSIS — H1010 Acute atopic conjunctivitis, unspecified eye: Secondary | ICD-10-CM | POA: Insufficient documentation

## 2011-10-15 DIAGNOSIS — G894 Chronic pain syndrome: Secondary | ICD-10-CM

## 2011-10-15 DIAGNOSIS — R300 Dysuria: Secondary | ICD-10-CM

## 2011-10-15 DIAGNOSIS — N390 Urinary tract infection, site not specified: Secondary | ICD-10-CM | POA: Insufficient documentation

## 2011-10-15 DIAGNOSIS — Z1239 Encounter for other screening for malignant neoplasm of breast: Secondary | ICD-10-CM

## 2011-10-15 LAB — POCT URINALYSIS DIPSTICK
Blood, UA: NEGATIVE
Glucose, UA: NEGATIVE
Nitrite, UA: POSITIVE
Protein, UA: NEGATIVE
Spec Grav, UA: <=1.005
Urobilinogen, UA: 0.2
pH, UA: 7.5

## 2011-10-15 MED ORDER — OXYCODONE-ACETAMINOPHEN 10-325 MG PO TABS: 1.0000 | ORAL_TABLET | ORAL | Status: DC | PRN

## 2011-10-15 MED ORDER — AZELASTINE HCL 0.05 % OP SOLN: 1.0000 [drp] | Freq: Two times a day (BID) | OPHTHALMIC | Status: DC

## 2011-10-15 MED ORDER — CIPROFLOXACIN HCL 500 MG PO TABS: 500.0000 mg | ORAL_TABLET | Freq: Two times a day (BID) | ORAL | Status: AC

## 2011-10-15 MED ORDER — PHENAZOPYRIDINE HCL 200 MG PO TABS: 200.0000 mg | ORAL_TABLET | Freq: Three times a day (TID) | ORAL | Status: AC | PRN

## 2011-10-15 MED ORDER — FENTANYL 50 MCG/HR TD PT72: 1.0000 | MEDICATED_PATCH | TRANSDERMAL | Status: DC

## 2011-10-15 NOTE — Assessment & Plan Note (Signed)
cipro for 5 days Check urine culture

## 2011-10-15 NOTE — Assessment & Plan Note (Signed)
Refill pt meds  she is not abusing them

## 2011-10-15 NOTE — Assessment & Plan Note (Signed)
optivar  Pt already taking benadryl

## 2011-10-15 NOTE — Assessment & Plan Note (Signed)
Recurrent --- per GI

## 2011-10-15 NOTE — Patient Instructions (Addendum)
Allergic Conjunctivitis The conjunctiva is a thin membrane that covers the visible white part of the eyeball and the underside of the eyelids. This membrane protects and lubricates the eye. The membrane has small blood vessels running through it that can normally be seen. When the conjunctiva becomes inflamed, the condition is called conjunctivitis. In response to the inflammation, the conjunctival blood vessels become swollen. The swelling results in redness in the normally white part of the eye. The blood vessels of this membrane also react when a person has allergies and is then called allergic conjunctivitis. This condition usually lasts for as long as the allergy persists. Allergic conjunctivitis cannot be passed to another person (non-contagious). The likelihood of bacterial infection is great and the cause is not likely due to allergies if the inflamed eye has:  A sticky discharge.   Discharge or sticking together of the lids in the morning.   Scaling or flaking of the eyelids where the eyelashes come out.   Red swollen eyelids.  CAUSES   Viruses.   Irritants such as foreign bodies.   Chemicals.   General allergic reactions.   Inflammation or serious diseases in the inside or the outside of the eye or the orbit (the boney cavity in which the eye sits) can cause a "red eye."  SYMPTOMS   Eye redness.   Tearing.   Itchy eyes.   Burning feeling in the eyes.   Clear drainage from the eye.   Allergic reaction due to pollens or ragweed sensitivity. Seasonal allergic conjunctivitis is frequent in the spring when pollens are in the air and in the fall.  DIAGNOSIS  This condition, in its many forms, is usually diagnosed based on the history and an ophthalmological exam. It usually involves both eyes. If your eyes react at the same time every year, allergies may be the cause. While most "red eyes" are due to allergy or an infection, the role of an eye (ophthalmological) exam is  important. The exam can rule out serious diseases of the eye or orbit. TREATMENT   Non-antibiotic eye drops, ointments, or medications by mouth may be prescribed if the ophthalmologist is sure the conjunctivitis is due to allergies alone.   Over-the-counter drops and ointments for allergic symptoms should be used only after other causes of conjunctivitis have been ruled out, or as your caregiver suggests.  Medications by mouth are often prescribed if other allergy-related symptoms are present. If the ophthalmologist is sure that the conjunctivitis is due to allergies alone, treatment is normally limited to drops or ointments to reduce itching and burning. HOME CARE INSTRUCTIONS   Wash hands before and after applying drops or ointments, or touching the inflamed eye(s) or eyelids.   Do not let the eye dropper tip or ointment tube touch the eyelid when putting medicine in your eye.   Stop using your soft contact lenses and throw them away. Use a new pair of lenses when recovery is complete. You should run through sterilizing cycles at least three times before use after complete recovery if the old soft contact lenses are to be used. Hard contact lenses should be stopped. They need to be thoroughly sterilized before use after recovery.   Itching and burning eyes due to allergies is often relieved by using a cool cloth applied to closed eye(s).  SEEK MEDICAL CARE IF:   Your problems do not go away after two or three days of treatment.   Your lids are sticky (especially in the morning when   you wake up) or stick together.   Discharge develops. Antibiotics may be needed either as drops, ointment, or by mouth.   You have extreme light sensitivity.   An oral temperature above 102 F (38.9 C) develops.   Pain in or around the eye or any other visual symptom develops.  MAKE SURE YOU:   Understand these instructions.   Will watch your condition.   Will get help right away if you are not doing  well or get worse.  Document Released: 05/26/2002 Document Revised: 02/22/2011 Document Reviewed: 04/21/2007 [employer] Patient Information 2012 [employer]. 

## 2011-10-15 NOTE — Progress Notes (Signed)
  Subjective:    Patient ID: [name], female    DOB: 04/28/1945, 66 y.o.   MRN: [id number]  HPI Pt here c/o swollen eyes, itchy and clear drainage.  Some nasal congestion but mostly from eyes. Pt also needs refilll of pain maeds.   She is also c/o of dysuria and frequentcy and thinks she has a uti.  No fevers no abd pain.     Review of Systems As above    Objective:   Physical Exam  Constitutional: She appears well-developed and well-nourished.  Eyes: Right eye exhibits discharge. Left eye exhibits discharge.       Eyelids swollen and clear drainage  Neck: Normal range of motion. Neck supple.  Neurological: She is alert.  Psychiatric: She has a normal mood and affect. Her behavior is normal. Judgment and thought content normal.  UA--- + leuk , + nitrites        Assessment & Plan:

## 2011-10-17 ENCOUNTER — Other Ambulatory Visit: Payer: Self-pay | Admitting: Family Medicine

## 2011-10-17 LAB — URINE CULTURE: Colony Count: >=100000

## 2011-10-17 NOTE — Telephone Encounter (Signed)
Last seen 10/15/11 and filled 09/15/11 #60. Please advise    KP

## 2011-10-21 ENCOUNTER — Encounter: Payer: Self-pay | Admitting: Pulmonary Disease

## 2011-10-22 ENCOUNTER — Ambulatory Visit (INDEPENDENT_AMBULATORY_CARE_PROVIDER_SITE_OTHER)
Admission: RE | Admit: 2011-10-22 | Discharge: 2011-10-22 | Disposition: A | Discharge status: Home / Self Care | Payer: Medicare Other | Source: Ambulatory Visit

## 2011-10-22 DIAGNOSIS — M810 Age-related osteoporosis without current pathological fracture: Secondary | ICD-10-CM

## 2011-10-23 ENCOUNTER — Telehealth: Payer: Self-pay | Admitting: Pulmonary Disease

## 2011-10-23 NOTE — Telephone Encounter (Signed)
06/28/08 PFT>>FEV1 1.95 (97%), FEV1% 70, TLC 3.88 (87%), DLCO 64%  10/10/11 PFT>>FEV1 1.15 (59%), FEV1% 64, TLC 3.26 (73%), DLCO 35%, + BD  She has significant decrease in diffusion capacity compared to 2010.  Left message on pt's voicemail detailing results.  Will need to discuss with cardiology about whether to continue amiodarone.  Will route message to Dr. Antoine Poche for his review.

## 2011-10-24 NOTE — Telephone Encounter (Signed)
She can stop the amiodarone with this result.  We will deal with the atrial fibrillation if she has a symptomatic recurrence.  We will call her to stop the medication.

## 2011-10-24 NOTE — Telephone Encounter (Signed)
Reviewed orders to stop Amiodarone with pt who stated understanding. She will call back should she have any s/s of At Fib.

## 2011-10-25 NOTE — Telephone Encounter (Signed)
Noted  

## 2011-10-30 ENCOUNTER — Telehealth: Payer: Self-pay | Admitting: Pulmonary Disease

## 2011-10-30 NOTE — Telephone Encounter (Signed)
Spoke with pt-states that her cardiology office called her about 1 week ago and they stated per her PFT results they wanted her to come off one of her BP meds; states she would like a call from VS or his nurse with results and if he agrees to have her come off BP med. Aware that VS is out of office until Thursday.

## 2011-10-30 NOTE — Telephone Encounter (Signed)
I spoke with pt and explained the results of her PFT particularly in regard to decrease in diffusion capacity and how amiodarone can affect this.

## 2011-10-31 ENCOUNTER — Other Ambulatory Visit: Payer: Self-pay | Admitting: Family Medicine

## 2011-10-31 NOTE — Telephone Encounter (Signed)
Last seen 10/15/11 and filled 09/25/11 # 60. Please advise      KP

## 2011-11-02 ENCOUNTER — Other Ambulatory Visit: Payer: Self-pay | Admitting: Family Medicine

## 2011-11-06 IMAGING — CR DG CHEST 2V
2 series · 2 of 2 positions shown · non-contrast
Comparison: 12/13/2010 and CT of 08/17/2010.

CLINICAL DATA: Follow up of pneumonia.  Cough.  Ex-smoker.  COPD.

CHEST - 2 VIEW

[view not recorded (1 of 2)]
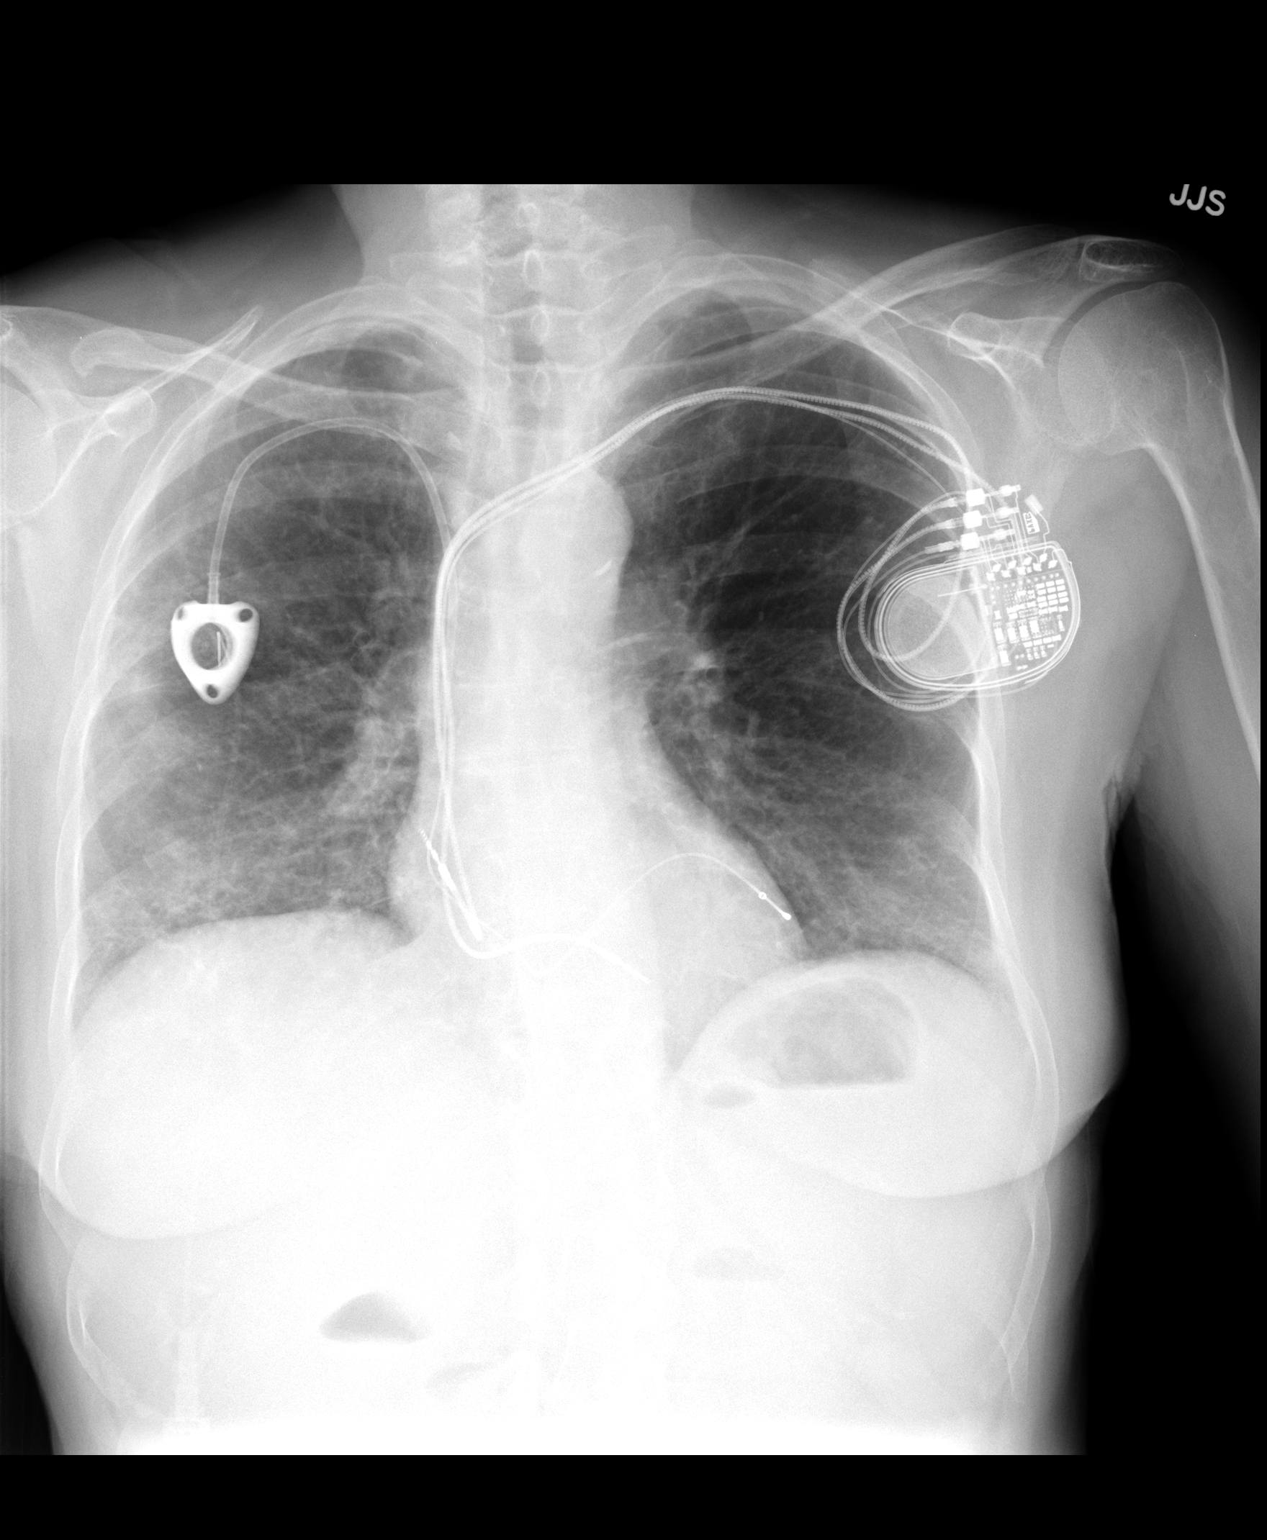

[view not recorded (2 of 2)]
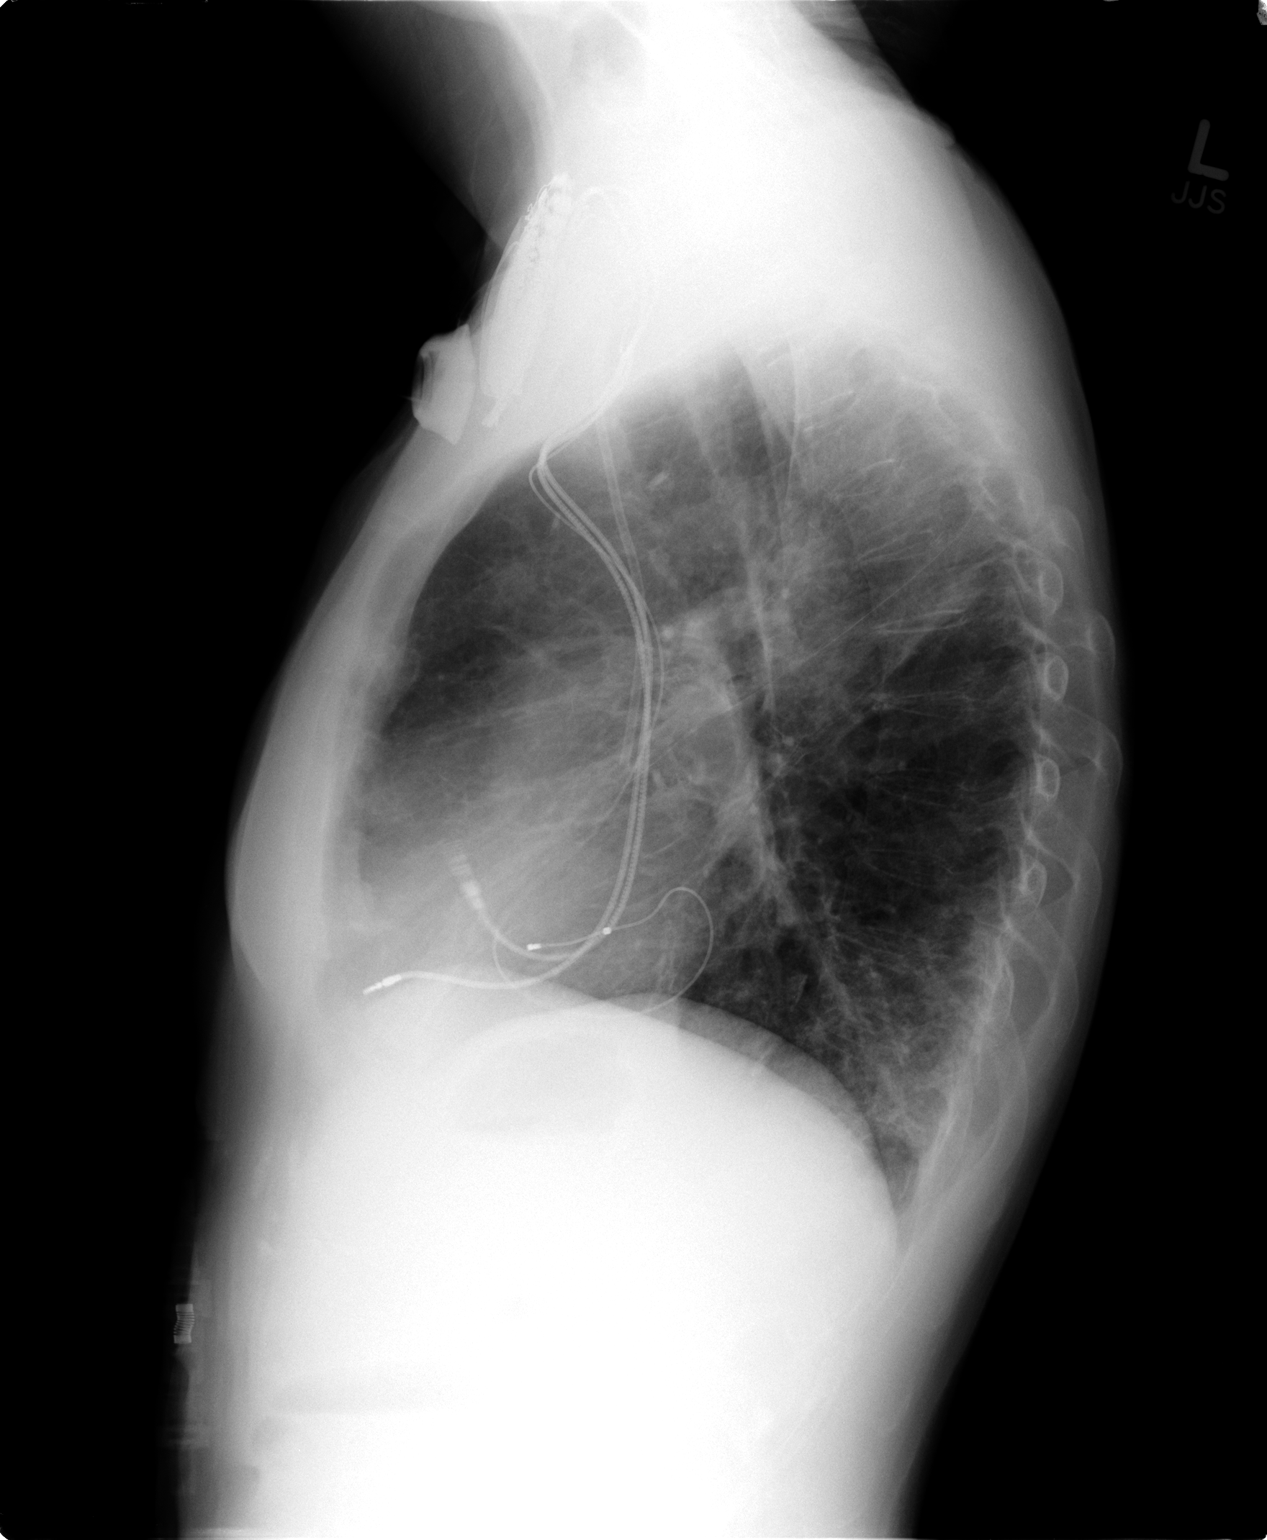

[2 of 2 positions shown; findings below may reference images not displayed]

FINDINGS: Pacer leads right atrium, right ventricle, coronary
sinus.  Right-sided Port-A-Cath terminates at the low SVC.

Midline trachea.  Normal heart size and mediastinal contours. No
pleural effusion or pneumothorax.  Biapical pleural thickening.
Patchy right greater than left interstitial and airspace disease is
not significantly changed.  Slightly lower lobe predominant.
Relative sparing of the left upper lobe.
IMPRESSION: No change in patchy interstitial and air space disease bilaterally.
Favor atypical infection.  Aspiration could have a similar
appearance.

## 2011-11-08 ENCOUNTER — Encounter: Payer: Medicare Other | Admitting: Internal Medicine

## 2011-11-12 ENCOUNTER — Ambulatory Visit (INDEPENDENT_AMBULATORY_CARE_PROVIDER_SITE_OTHER): Payer: Medicare Other | Admitting: Internal Medicine

## 2011-11-12 DIAGNOSIS — E538 Deficiency of other specified B group vitamins: Secondary | ICD-10-CM

## 2011-11-12 MED ORDER — CYANOCOBALAMIN 1000 MCG/ML IJ SOLN
1000.0000 ug | INTRAMUSCULAR | Status: DC
  Administered 2011-11-12 – 2012-04-03 (×3): 1000 ug via INTRAMUSCULAR

## 2011-11-14 ENCOUNTER — Ambulatory Visit (INDEPENDENT_AMBULATORY_CARE_PROVIDER_SITE_OTHER): Payer: Medicare Other

## 2011-11-14 DIAGNOSIS — M810 Age-related osteoporosis without current pathological fracture: Secondary | ICD-10-CM

## 2011-11-14 MED ORDER — DENOSUMAB 60 MG/ML ~~LOC~~ SOLN
60.0000 mg | Freq: Once | SUBCUTANEOUS | Status: AC
  Administered 2011-11-14: 60 mg via SUBCUTANEOUS

## 2011-11-15 ENCOUNTER — Other Ambulatory Visit: Payer: Self-pay | Admitting: Family Medicine

## 2011-11-15 ENCOUNTER — Other Ambulatory Visit: Payer: Self-pay | Admitting: Urology

## 2011-11-15 NOTE — Telephone Encounter (Signed)
Last seen 10/15/11 and filled 10/17/11 3 60. Please advise    KP

## 2011-11-23 ENCOUNTER — Telehealth: Payer: Self-pay | Admitting: Family Medicine

## 2011-11-23 DIAGNOSIS — R52 Pain, unspecified: Secondary | ICD-10-CM

## 2011-11-23 MED ORDER — FENTANYL 50 MCG/HR TD PT72: 1.0000 | MEDICATED_PATCH | TRANSDERMAL | Status: DC

## 2011-11-23 MED ORDER — OXYCODONE-ACETAMINOPHEN 10-325 MG PO TABS: 1.0000 | ORAL_TABLET | ORAL | Status: DC | PRN

## 2011-11-23 NOTE — Telephone Encounter (Signed)
Patient needs rx for oxycodone 10-325mg  and fentanyl 62mcg/hr. Call [phone] when ready.

## 2011-11-23 NOTE — Telephone Encounter (Signed)
Ok to refill x 1  

## 2011-11-23 NOTE — Telephone Encounter (Signed)
Last seen and filled 10/15/11 # 120 Oxy an #10 fentanyl. Please advise     KP

## 2011-11-23 NOTE — Telephone Encounter (Signed)
Patient aware Rx ready for pick up.      KP 

## 2011-12-03 ENCOUNTER — Other Ambulatory Visit: Payer: Self-pay | Admitting: Family Medicine

## 2011-12-03 NOTE — Telephone Encounter (Signed)
Last seen 10/15/11 and filled 10/31/11 # 60. Please advise     KP

## 2011-12-04 IMAGING — NM NM BONE 3 PHASE
1 series · 12 of 12 positions shown · non-contrast
Comparison: CT abdomen pelvis 08/08/2010.

CLINICAL DATA: Right hip pain.  Question stress fracture.

NUCLEAR MEDICINE THREE PHASE BONE SCAN
TECHNIQUE: Radionuclide angiographic images, immediate static
blood pool images, and 3-hour delayed static images were obtained
after intravenous injection of radiopharmaceutical.
Radiopharmaceutical: 25 mCi technetium-00m - MDP.

[Series 0: 3 phase bone · 9.33mm/px · 2 acquisitions, 12 frames shown]
[im 1/2]
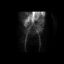
[im 1/2]
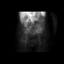
[im 1/2]
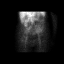
[im 1/2]
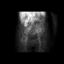
[im 1/2]
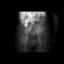
[im 1/2]
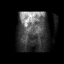
[im 2/2]
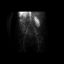
[im 2/2]
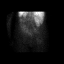
[im 2/2]
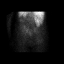
[im 2/2]
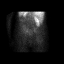
[im 2/2]
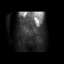
[im 2/2]
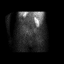

[12 of 12 positions shown; findings below may reference images not displayed]

FINDINGS: There is no definite asymmetric flow to the right hip.
Blood pool and delayed images show abnormal activity in the right
hip joint, with some involvement of the right femoral neck.
IMPRESSION: Degenerative changes in the right hip with possible activity in the
right femoral neck.  A stress fracture cannot be definitively
excluded.  MRI without contrast may be helpful in further
evaluation, as clinically indicated.

## 2011-12-10 ENCOUNTER — Telehealth: Payer: Self-pay | Admitting: Family Medicine

## 2011-12-10 MED ORDER — PROMETHAZINE HCL 25 MG PO TABS: 25.0000 mg | ORAL_TABLET | Freq: Three times a day (TID) | ORAL | Status: DC | PRN

## 2011-12-10 NOTE — Telephone Encounter (Signed)
Patient aware Rx ready for pick up.      KP 

## 2011-12-10 NOTE — Telephone Encounter (Signed)
Refill: Promethazine 25mg  tablets. Take 1 tablet by mouth every 8 hours as needed for nausea. Qty 40. Last fill 6.4.13

## 2011-12-10 NOTE — Telephone Encounter (Signed)
Last seen 10/15/11 and filled 08/21/11 # 40 please advise     KP

## 2011-12-10 NOTE — Telephone Encounter (Signed)
Refill x1 

## 2011-12-11 ENCOUNTER — Emergency Department (HOSPITAL_COMMUNITY): Payer: Medicare Other

## 2011-12-11 ENCOUNTER — Inpatient Hospital Stay (HOSPITAL_COMMUNITY): Payer: Medicare Other

## 2011-12-11 ENCOUNTER — Encounter (HOSPITAL_COMMUNITY): Payer: Self-pay | Admitting: *Deleted

## 2011-12-11 ENCOUNTER — Inpatient Hospital Stay (HOSPITAL_COMMUNITY)
Admission: EM | Admit: 2011-12-11 | Discharge: 2011-12-18 | DRG: 388 | Disposition: A | Discharge status: Home Health | Payer: Medicare Other | Attending: Family Medicine | Admitting: Family Medicine

## 2011-12-11 ENCOUNTER — Other Ambulatory Visit: Payer: Self-pay | Admitting: Internal Medicine

## 2011-12-11 ENCOUNTER — Other Ambulatory Visit: Payer: Self-pay | Admitting: Cardiology

## 2011-12-11 ENCOUNTER — Telehealth: Payer: Self-pay | Admitting: Internal Medicine

## 2011-12-11 DIAGNOSIS — K56609 Unspecified intestinal obstruction, unspecified as to partial versus complete obstruction: Secondary | ICD-10-CM

## 2011-12-11 DIAGNOSIS — I82629 Acute embolism and thrombosis of deep veins of unspecified upper extremity: Secondary | ICD-10-CM

## 2011-12-11 DIAGNOSIS — Z9049 Acquired absence of other specified parts of digestive tract: Secondary | ICD-10-CM

## 2011-12-11 DIAGNOSIS — G894 Chronic pain syndrome: Secondary | ICD-10-CM

## 2011-12-11 DIAGNOSIS — M1990 Unspecified osteoarthritis, unspecified site: Secondary | ICD-10-CM

## 2011-12-11 DIAGNOSIS — R109 Unspecified abdominal pain: Secondary | ICD-10-CM

## 2011-12-11 DIAGNOSIS — R5382 Chronic fatigue, unspecified: Secondary | ICD-10-CM

## 2011-12-11 DIAGNOSIS — E876 Hypokalemia: Secondary | ICD-10-CM | POA: Diagnosis not present

## 2011-12-11 DIAGNOSIS — Z882 Allergy status to sulfonamides status: Secondary | ICD-10-CM

## 2011-12-11 DIAGNOSIS — E871 Hypo-osmolality and hyponatremia: Secondary | ICD-10-CM | POA: Diagnosis not present

## 2011-12-11 DIAGNOSIS — R32 Unspecified urinary incontinence: Secondary | ICD-10-CM

## 2011-12-11 DIAGNOSIS — M810 Age-related osteoporosis without current pathological fracture: Secondary | ICD-10-CM

## 2011-12-11 DIAGNOSIS — I2510 Atherosclerotic heart disease of native coronary artery without angina pectoris: Secondary | ICD-10-CM | POA: Diagnosis present

## 2011-12-11 DIAGNOSIS — Z90710 Acquired absence of both cervix and uterus: Secondary | ICD-10-CM

## 2011-12-11 DIAGNOSIS — N390 Urinary tract infection, site not specified: Secondary | ICD-10-CM | POA: Diagnosis present

## 2011-12-11 DIAGNOSIS — I5022 Chronic systolic (congestive) heart failure: Secondary | ICD-10-CM | POA: Diagnosis present

## 2011-12-11 DIAGNOSIS — J189 Pneumonia, unspecified organism: Secondary | ICD-10-CM

## 2011-12-11 DIAGNOSIS — M169 Osteoarthritis of hip, unspecified: Secondary | ICD-10-CM

## 2011-12-11 DIAGNOSIS — E43 Unspecified severe protein-calorie malnutrition: Secondary | ICD-10-CM | POA: Diagnosis present

## 2011-12-11 DIAGNOSIS — Z8719 Personal history of other diseases of the digestive system: Secondary | ICD-10-CM

## 2011-12-11 DIAGNOSIS — E860 Dehydration: Secondary | ICD-10-CM

## 2011-12-11 DIAGNOSIS — Z79899 Other long term (current) drug therapy: Secondary | ICD-10-CM

## 2011-12-11 DIAGNOSIS — Z9089 Acquired absence of other organs: Secondary | ICD-10-CM

## 2011-12-11 DIAGNOSIS — I509 Heart failure, unspecified: Secondary | ICD-10-CM | POA: Diagnosis present

## 2011-12-11 DIAGNOSIS — K5289 Other specified noninfective gastroenteritis and colitis: Secondary | ICD-10-CM

## 2011-12-11 DIAGNOSIS — D509 Iron deficiency anemia, unspecified: Secondary | ICD-10-CM

## 2011-12-11 DIAGNOSIS — Z23 Encounter for immunization: Secondary | ICD-10-CM

## 2011-12-11 DIAGNOSIS — R110 Nausea: Secondary | ICD-10-CM

## 2011-12-11 DIAGNOSIS — J439 Emphysema, unspecified: Secondary | ICD-10-CM | POA: Diagnosis present

## 2011-12-11 DIAGNOSIS — K2990 Gastroduodenitis, unspecified, without bleeding: Secondary | ICD-10-CM

## 2011-12-11 DIAGNOSIS — R1033 Periumbilical pain: Secondary | ICD-10-CM

## 2011-12-11 DIAGNOSIS — R55 Syncope and collapse: Secondary | ICD-10-CM

## 2011-12-11 DIAGNOSIS — Z86718 Personal history of other venous thrombosis and embolism: Secondary | ICD-10-CM

## 2011-12-11 DIAGNOSIS — D649 Anemia, unspecified: Secondary | ICD-10-CM

## 2011-12-11 DIAGNOSIS — IMO0002 Reserved for concepts with insufficient information to code with codable children: Secondary | ICD-10-CM

## 2011-12-11 DIAGNOSIS — Z888 Allergy status to other drugs, medicaments and biological substances status: Secondary | ICD-10-CM

## 2011-12-11 DIAGNOSIS — K5730 Diverticulosis of large intestine without perforation or abscess without bleeding: Secondary | ICD-10-CM

## 2011-12-11 DIAGNOSIS — G9332 Myalgic encephalomyelitis/chronic fatigue syndrome: Secondary | ICD-10-CM

## 2011-12-11 DIAGNOSIS — I480 Paroxysmal atrial fibrillation: Secondary | ICD-10-CM | POA: Diagnosis present

## 2011-12-11 DIAGNOSIS — Z8614 Personal history of Methicillin resistant Staphylococcus aureus infection: Secondary | ICD-10-CM

## 2011-12-11 DIAGNOSIS — R932 Abnormal findings on diagnostic imaging of liver and biliary tract: Secondary | ICD-10-CM

## 2011-12-11 DIAGNOSIS — I428 Other cardiomyopathies: Secondary | ICD-10-CM

## 2011-12-11 DIAGNOSIS — I447 Left bundle-branch block, unspecified: Secondary | ICD-10-CM

## 2011-12-11 DIAGNOSIS — K660 Peritoneal adhesions (postprocedural) (postinfection): Secondary | ICD-10-CM | POA: Diagnosis present

## 2011-12-11 DIAGNOSIS — E538 Deficiency of other specified B group vitamins: Secondary | ICD-10-CM

## 2011-12-11 DIAGNOSIS — Z9981 Dependence on supplemental oxygen: Secondary | ICD-10-CM

## 2011-12-11 DIAGNOSIS — E440 Moderate protein-calorie malnutrition: Secondary | ICD-10-CM | POA: Diagnosis present

## 2011-12-11 DIAGNOSIS — Z8711 Personal history of peptic ulcer disease: Secondary | ICD-10-CM

## 2011-12-11 DIAGNOSIS — G2581 Restless legs syndrome: Secondary | ICD-10-CM

## 2011-12-11 DIAGNOSIS — Z681 Body mass index (BMI) 19 or less, adult: Secondary | ICD-10-CM

## 2011-12-11 DIAGNOSIS — J449 Chronic obstructive pulmonary disease, unspecified: Secondary | ICD-10-CM | POA: Diagnosis present

## 2011-12-11 DIAGNOSIS — K3184 Gastroparesis: Secondary | ICD-10-CM

## 2011-12-11 DIAGNOSIS — Z950 Presence of cardiac pacemaker: Secondary | ICD-10-CM

## 2011-12-11 DIAGNOSIS — J9611 Chronic respiratory failure with hypoxia: Secondary | ICD-10-CM | POA: Diagnosis present

## 2011-12-11 DIAGNOSIS — R300 Dysuria: Secondary | ICD-10-CM

## 2011-12-11 DIAGNOSIS — I4892 Unspecified atrial flutter: Secondary | ICD-10-CM | POA: Diagnosis present

## 2011-12-11 DIAGNOSIS — Z87891 Personal history of nicotine dependence: Secondary | ICD-10-CM

## 2011-12-11 DIAGNOSIS — R52 Pain, unspecified: Secondary | ICD-10-CM

## 2011-12-11 DIAGNOSIS — J4489 Other specified chronic obstructive pulmonary disease: Secondary | ICD-10-CM

## 2011-12-11 DIAGNOSIS — K219 Gastro-esophageal reflux disease without esophagitis: Secondary | ICD-10-CM | POA: Diagnosis present

## 2011-12-11 DIAGNOSIS — IMO0001 Reserved for inherently not codable concepts without codable children: Secondary | ICD-10-CM

## 2011-12-11 DIAGNOSIS — R1032 Left lower quadrant pain: Secondary | ICD-10-CM

## 2011-12-11 DIAGNOSIS — J9610 Chronic respiratory failure, unspecified whether with hypoxia or hypercapnia: Secondary | ICD-10-CM | POA: Diagnosis present

## 2011-12-11 DIAGNOSIS — K565 Intestinal adhesions [bands], unspecified as to partial versus complete obstruction: Principal | ICD-10-CM | POA: Diagnosis present

## 2011-12-11 DIAGNOSIS — H1010 Acute atopic conjunctivitis, unspecified eye: Secondary | ICD-10-CM

## 2011-12-11 DIAGNOSIS — E559 Vitamin D deficiency, unspecified: Secondary | ICD-10-CM | POA: Diagnosis present

## 2011-12-11 DIAGNOSIS — I10 Essential (primary) hypertension: Secondary | ICD-10-CM

## 2011-12-11 DIAGNOSIS — R29898 Other symptoms and signs involving the musculoskeletal system: Secondary | ICD-10-CM

## 2011-12-11 DIAGNOSIS — I4891 Unspecified atrial fibrillation: Secondary | ICD-10-CM

## 2011-12-11 DIAGNOSIS — K2970 Gastritis, unspecified, without bleeding: Secondary | ICD-10-CM

## 2011-12-11 DIAGNOSIS — Z96649 Presence of unspecified artificial hip joint: Secondary | ICD-10-CM

## 2011-12-11 DIAGNOSIS — K5909 Other constipation: Secondary | ICD-10-CM | POA: Diagnosis present

## 2011-12-11 LAB — CBC WITH DIFFERENTIAL/PLATELET
Basophils Absolute: 0 10*3/uL (ref 0.0–0.1)
Basophils Relative: 0 % (ref 0–1)
Eosinophils Absolute: 0 10*3/uL (ref 0.0–0.7)
Eosinophils Relative: 0 % (ref 0–5)
HCT: 40.6 % (ref 36.0–46.0)
Hemoglobin: 13.3 g/dL (ref 12.0–15.0)
Lymphocytes Relative: 7 % — ABNORMAL LOW (ref 12–46)
Lymphs Abs: 0.8 10*3/uL (ref 0.7–4.0)
MCH: 29.8 pg (ref 26.0–34.0)
MCHC: 32.8 g/dL (ref 30.0–36.0)
MCV: 91 fL (ref 78.0–100.0)
Monocytes Absolute: 1.4 10*3/uL — ABNORMAL HIGH (ref 0.1–1.0)
Monocytes Relative: 12 % (ref 3–12)
Neutro Abs: 9.1 10*3/uL — ABNORMAL HIGH (ref 1.7–7.7)
Neutrophils Relative %: 81 % — ABNORMAL HIGH (ref 43–77)
Platelets: 365 10*3/uL (ref 150–400)
RBC: 4.46 MIL/uL (ref 3.87–5.11)
RDW: 13.7 % (ref 11.5–15.5)
WBC: 11.3 10*3/uL — ABNORMAL HIGH (ref 4.0–10.5)

## 2011-12-11 LAB — COMPREHENSIVE METABOLIC PANEL
ALT: 13 U/L (ref 0–35)
AST: 16 U/L (ref 0–37)
Albumin: 2.4 g/dL — ABNORMAL LOW (ref 3.5–5.2)
Alkaline Phosphatase: 85 U/L (ref 39–117)
BUN: 35 mg/dL — ABNORMAL HIGH (ref 6–23)
CO2: 27 mEq/L (ref 19–32)
Calcium: 6.2 mg/dL — CL (ref 8.4–10.5)
Chloride: 97 mEq/L (ref 96–112)
Creatinine, Ser: 0.82 mg/dL (ref 0.50–1.10)
GFR calc Af Amer: 84 mL/min — ABNORMAL LOW (ref >90)
GFR calc non Af Amer: 72 mL/min — ABNORMAL LOW (ref >90)
Glucose, Bld: 100 mg/dL — ABNORMAL HIGH (ref 70–99)
Potassium: 4.6 mEq/L (ref 3.5–5.1)
Sodium: 138 mEq/L (ref 135–145)
Total Bilirubin: 0.3 mg/dL (ref 0.3–1.2)
Total Protein: 5.9 g/dL — ABNORMAL LOW (ref 6.0–8.3)

## 2011-12-11 LAB — URINALYSIS, ROUTINE W REFLEX MICROSCOPIC
Glucose, UA: NEGATIVE mg/dL
Hgb urine dipstick: NEGATIVE
Nitrite: NEGATIVE
Protein, ur: NEGATIVE mg/dL
Specific Gravity, Urine: 1.025 (ref 1.005–1.030)
Urobilinogen, UA: 0.2 mg/dL (ref 0.0–1.0)
pH: 5.5 (ref 5.0–8.0)

## 2011-12-11 LAB — LIPASE, BLOOD: Lipase: 8 U/L — ABNORMAL LOW (ref 11–59)

## 2011-12-11 LAB — URINE MICROSCOPIC-ADD ON

## 2011-12-11 LAB — MAGNESIUM: Magnesium: 1.9 mg/dL (ref 1.5–2.5)

## 2011-12-11 MED ORDER — ALBUTEROL SULFATE HFA 108 (90 BASE) MCG/ACT IN AERS
2.0000 | INHALATION_SPRAY | Freq: Four times a day (QID) | RESPIRATORY_TRACT | Status: DC | PRN
  Filled 2011-12-11: qty 6.7

## 2011-12-11 MED ORDER — ACETAMINOPHEN 650 MG RE SUPP
650.0000 mg | Freq: Four times a day (QID) | RECTAL | Status: DC | PRN
  Administered 2011-12-17: 650 mg via RECTAL
  Filled 2011-12-11 (×2): qty 1

## 2011-12-11 MED ORDER — ONDANSETRON HCL 4 MG/2ML IJ SOLN
4.0000 mg | Freq: Once | INTRAMUSCULAR | Status: AC
  Administered 2011-12-11: 4 mg via INTRAVENOUS
  Filled 2011-12-11: qty 2

## 2011-12-11 MED ORDER — AZELASTINE HCL 0.05 % OP SOLN: 1.0000 [drp] | Freq: Two times a day (BID) | OPHTHALMIC | Status: DC

## 2011-12-11 MED ORDER — INFLUENZA VIRUS VACC SPLIT PF IM SUSP
0.5000 mL | INTRAMUSCULAR | Status: AC
  Administered 2011-12-12: 0.5 mL via INTRAMUSCULAR
  Filled 2011-12-11: qty 0.5

## 2011-12-11 MED ORDER — ONDANSETRON 8 MG/NS 50 ML IVPB
8.0000 mg | Freq: Four times a day (QID) | INTRAVENOUS | Status: DC | PRN
  Filled 2011-12-11: qty 8

## 2011-12-11 MED ORDER — METOPROLOL TARTRATE 1 MG/ML IV SOLN
2.5000 mg | Freq: Four times a day (QID) | INTRAVENOUS | Status: DC
  Administered 2011-12-12 – 2011-12-18 (×23): 2.5 mg via INTRAVENOUS
  Filled 2011-12-11 (×31): qty 5

## 2011-12-11 MED ORDER — MENTHOL 3 MG MT LOZG: 1.0000 | LOZENGE | OROMUCOSAL | Status: DC | PRN

## 2011-12-11 MED ORDER — FENTANYL 50 MCG/HR TD PT72
50.0000 ug | MEDICATED_PATCH | TRANSDERMAL | Status: DC
  Administered 2011-12-12 – 2011-12-18 (×3): 50 ug via TRANSDERMAL
  Filled 2011-12-11 (×3): qty 1

## 2011-12-11 MED ORDER — FENTANYL CITRATE 0.05 MG/ML IJ SOLN
25.0000 ug | INTRAMUSCULAR | Status: DC | PRN
  Administered 2011-12-11: 50 ug via INTRAVENOUS
  Filled 2011-12-11 (×2): qty 2

## 2011-12-11 MED ORDER — PANTOPRAZOLE SODIUM 40 MG IV SOLR
40.0000 mg | Freq: Two times a day (BID) | INTRAVENOUS | Status: DC
  Administered 2011-12-12 – 2011-12-17 (×12): 40 mg via INTRAVENOUS
  Filled 2011-12-11 (×13): qty 40

## 2011-12-11 MED ORDER — LACTATED RINGERS IV BOLUS (SEPSIS): 1000.0000 mL | Freq: Once | INTRAVENOUS | Status: DC

## 2011-12-11 MED ORDER — LIP MEDEX EX OINT
1.0000 "application " | TOPICAL_OINTMENT | Freq: Two times a day (BID) | CUTANEOUS | Status: DC
  Administered 2011-12-12 – 2011-12-18 (×13): 1 via TOPICAL
  Filled 2011-12-11 (×2): qty 7

## 2011-12-11 MED ORDER — ONDANSETRON HCL 4 MG/2ML IJ SOLN: 4.0000 mg | Freq: Four times a day (QID) | INTRAMUSCULAR | Status: DC | PRN

## 2011-12-11 MED ORDER — FENTANYL CITRATE 0.05 MG/ML IJ SOLN
12.5000 ug | INTRAMUSCULAR | Status: DC | PRN
  Administered 2011-12-12 – 2011-12-13 (×9): 12.5 ug via INTRAVENOUS
  Filled 2011-12-11 (×9): qty 2

## 2011-12-11 MED ORDER — DIPHENHYDRAMINE HCL 50 MG/ML IJ SOLN
12.5000 mg | Freq: Four times a day (QID) | INTRAMUSCULAR | Status: DC | PRN
  Administered 2011-12-12 – 2011-12-14 (×6): 25 mg via INTRAVENOUS
  Administered 2011-12-15: 12.5 mg via INTRAVENOUS
  Administered 2011-12-15: 25 mg via INTRAVENOUS
  Filled 2011-12-11 (×7): qty 1

## 2011-12-11 MED ORDER — SODIUM CHLORIDE 0.9 % IV SOLN
INTRAVENOUS | Status: AC
  Administered 2011-12-11: via INTRAVENOUS
  Administered 2011-12-12: 1000 mL via INTRAVENOUS

## 2011-12-11 MED ORDER — PHENOL 1.4 % MT LIQD: 2.0000 | OROMUCOSAL | Status: DC | PRN

## 2011-12-11 MED ORDER — IOHEXOL 300 MG/ML  SOLN
80.0000 mL | Freq: Once | INTRAMUSCULAR | Status: AC | PRN
  Administered 2011-12-11: 80 mL via INTRAVENOUS

## 2011-12-11 MED ORDER — BISACODYL 10 MG RE SUPP
10.0000 mg | Freq: Every day | RECTAL | Status: DC
  Administered 2011-12-12 – 2011-12-17 (×2): 10 mg via RECTAL
  Filled 2011-12-11 (×4): qty 1

## 2011-12-11 MED ORDER — MAGIC MOUTHWASH
15.0000 mL | Freq: Four times a day (QID) | ORAL | Status: DC | PRN
  Filled 2011-12-11: qty 15

## 2011-12-11 MED ORDER — HYDROMORPHONE HCL PF 1 MG/ML IJ SOLN
1.0000 mg | Freq: Once | INTRAMUSCULAR | Status: AC
  Administered 2011-12-11: 1 mg via INTRAVENOUS
  Filled 2011-12-11: qty 1

## 2011-12-11 MED ORDER — SODIUM CHLORIDE 0.9 % IV BOLUS (SEPSIS)
1000.0000 mL | Freq: Once | INTRAVENOUS | Status: AC
  Administered 2011-12-11: 1000 mL via INTRAVENOUS

## 2011-12-11 MED ORDER — OLOPATADINE HCL 0.1 % OP SOLN
1.0000 [drp] | Freq: Two times a day (BID) | OPHTHALMIC | Status: DC
  Administered 2011-12-11 – 2011-12-18 (×14): 1 [drp] via OPHTHALMIC
  Filled 2011-12-11: qty 5

## 2011-12-11 MED ORDER — NITROGLYCERIN 0.4 MG SL SUBL: 0.4000 mg | SUBLINGUAL_TABLET | SUBLINGUAL | Status: DC | PRN

## 2011-12-11 MED ORDER — TIOTROPIUM BROMIDE MONOHYDRATE 18 MCG IN CAPS
18.0000 ug | ORAL_CAPSULE | Freq: Every day | RESPIRATORY_TRACT | Status: DC
  Administered 2011-12-12 – 2011-12-18 (×5): 18 ug via RESPIRATORY_TRACT
  Filled 2011-12-11: qty 5

## 2011-12-11 MED ORDER — ENOXAPARIN SODIUM 40 MG/0.4ML ~~LOC~~ SOLN
40.0000 mg | Freq: Every day | SUBCUTANEOUS | Status: DC
  Administered 2011-12-12 – 2011-12-13 (×3): 40 mg via SUBCUTANEOUS
  Filled 2011-12-11 (×4): qty 0.4

## 2011-12-11 MED ORDER — ALUM & MAG HYDROXIDE-SIMETH 200-200-20 MG/5ML PO SUSP: 30.0000 mL | Freq: Four times a day (QID) | ORAL | Status: DC | PRN

## 2011-12-11 MED ORDER — PROMETHAZINE HCL 25 MG/ML IJ SOLN
12.5000 mg | Freq: Four times a day (QID) | INTRAMUSCULAR | Status: DC | PRN
  Administered 2011-12-11 – 2011-12-14 (×3): 12.5 mg via INTRAVENOUS
  Administered 2011-12-17: 25 mg via INTRAVENOUS
  Administered 2011-12-17: 12.5 mg via INTRAVENOUS
  Administered 2011-12-18: 25 mg via INTRAVENOUS
  Administered 2011-12-18: 12.5 mg via INTRAVENOUS
  Filled 2011-12-11 (×7): qty 1

## 2011-12-11 MED ORDER — CIPROFLOXACIN IN D5W 400 MG/200ML IV SOLN
400.0000 mg | Freq: Two times a day (BID) | INTRAVENOUS | Status: DC
  Administered 2011-12-12 – 2011-12-16 (×10): 400 mg via INTRAVENOUS
  Filled 2011-12-11 (×10): qty 200

## 2011-12-11 MED ORDER — ONDANSETRON HCL 4 MG/2ML IJ SOLN
4.0000 mg | Freq: Four times a day (QID) | INTRAMUSCULAR | Status: DC | PRN
  Administered 2011-12-12 – 2011-12-18 (×12): 4 mg via INTRAVENOUS
  Filled 2011-12-11 (×13): qty 2

## 2011-12-11 NOTE — Telephone Encounter (Signed)
Pt with hx of SBO last seen 09/07/11. Other hx includes GERD, CAD, LBBB, AFIB, Gastroparesis and multiple other problems. Today, husband reports the obstruction began about 3 weeks ago and pt would stop eating and the problem would resolve; he states she has done this for years and when she gets obstructed, she vomits. Today, she is having intense pain allover her abdomen and is vomiting; pt's husband is asking that we direct admit her. Please advise. Thanks.

## 2011-12-11 NOTE — Telephone Encounter (Signed)
..   Requested Prescriptions   Pending Prescriptions Disp Refills  . amiodarone (PACERONE) 200 MG tablet [Pharmacy Med Name: AMIODARONE 200MG  TABLETS] 30 tablet 5    Sig: TAKE 1 TABLET BY MOUTH EVERY MORNING

## 2011-12-11 NOTE — ED Notes (Signed)
Attempt to get urine, pt said she can't go right now

## 2011-12-11 NOTE — ED Notes (Signed)
Pt states has a hx of bowel obstruction and think she's has one now, last BM was 4 days ago, been having nausea/vomiting since Sunday evening. States vomit is a dark brown color. Unable to keep down food or fluids.

## 2011-12-11 NOTE — ED Notes (Signed)
IV team called for 20g

## 2011-12-11 NOTE — H&P (Addendum)
[facility]                                                                                    Patient Demographics  [name], is a 66 y.o. female  CSN: [id number]  MRN: [id number]  DOB - 09/06/1945  Admit Date - 12/11/2011  Outpatient Primary MD for the patient is Loreen Freud, DO   With History of -  Past Medical History  Diagnosis Date  . GERD (gastroesophageal reflux disease)   . CAD (coronary artery disease)     mild disease per cath in 2011  . LBBB (left bundle branch block)   . Atrial fibrillation     on amiodarone  . Venous embolism and thrombosis of subclavian vein     after pacemaker insertion Oct 2011  . Pleural effusion      s/p right thoracentesis 03/09  . Small bowel obstruction   . Urinary incontinence      -INTERSTIM IMPLANT NOT FUNCTIONING PER PT  . RLS (restless legs syndrome)   . Primary dilated cardiomyopathy     EF 45 to 50% per echo in Jan 2012  . Vitamin B12 deficiency   . Gastroparesis   . Chronic nausea   . Chronic abdominal pain   . Chronic pain syndrome   . Pacemaker     CRT therapy; followed by Dr. Graciela Husbands  . H/O: GI bleed     from Pradaxa  . Oxygen dependent     2 liters via nasal cannula at all times  . CHF (congestive heart failure)   . Anemia   . Hx of stress fx aug 2011    right hip   . Elbow fracture, left aug 2012  . Arthritis     right hip  . Hx of gastric ulcer   . Diverticulitis   . HTN (hypertension)     under control; has been on med. x "years"  . COPD (chronic obstructive pulmonary disease)     continuous O2  . Hx MRSA infection   . Pneumonia - 03/09, 12/09, 02/10, 06/10    MOST RECENT FEB 2013  . Elevated liver enzymes   . Blood transfusion   . Anginal pain   . Shortness of breath   . Fibromyalgia     COUPLE OF TIMES A YEAR-PT HAS EPISODES OF CONFUSION-USUALLY INVOLVES DAY/NIGHT REVERSAL AND EPISODES OF TWITCHING AND FALLS--SEES DR. Modesto Charon - NEUROLOGIST-LAST SEEN 07/10/11      Past Surgical  History  Procedure Date  . Appendectomy   . Pacemaker placement 12/21/2009  . Peg removed     with complications  . Tonsillectomy age 69  . Interstim implant revision 03/06/2011    Procedure: REVISION OF Leane Platt;  Surgeon: Martina Sinner, MD;  Location: [facility];  Service: Urology;  Laterality: N/A;  Replacement of Neurostimulator  . Portacath placement 12/16/2009  . Total abdominal hysterectomy     complete  . Interstim implant placement 05/28/2006 - stage I    06/05/2006 - stage II  . Interstim implant revision 10/23/2007  . Cystoscopy with injection 04/30/2006    transurethral collagen injection; incision vaginal stenosis  . Small intestine  surgery 05/20/2001    ex. lap., resection of small bowel stricture; gastrostomy; insertion central line  . Cardiac catheterization 04/08/2006, 11/16/2009  . Cystoscopy 11/11/2008  . Cystoscopy w/ retrogrades 11/11/2008    right  . Colectomy     Intestinal resection (5times)  . Colectomy 02/04/2000    ex. lap., intra-abd. subtotal colectomy with ileosigmoid colon anastomosies and lysis of adhesions  . Gastrocutaneous fistula closure   . Exploratory laparotomy 04/27/2009    lysis of adhesions, gastrostomy tube  . Port-a-cath removal 07/27/2011    Procedure: REMOVAL PORT-A-CATH;  Surgeon: Emelia Loron, MD;  Location: [facility];  Service: General;  Laterality: N/A;  . Total hip arthroplasty 08/03/2011    Procedure: TOTAL HIP ARTHROPLASTY ANTERIOR APPROACH;  Surgeon: Kathryne Hitch, MD;  Location: [facility];  Service: Orthopedics;  Laterality: Right;  . Joint replacement 5/13    right total hip    in for   Chief Complaint  Patient presents with  . Nausea  . Emesis     HPI  [name]  is a 67 y.o. female, with past medical history significant for congestive heart failure or multiple episodes of small bowel obstruction and multiple surgeries for lysis of adhesions presenting with one month history of abdominal pain  nausea vomiting on and off today it became much worse she could not comment the times that she had vomiting denies any diarrhea no fever or chills. Patient denies any chest pains shortness of breath burning on urination.   Review of Systems    In addition to the HPI above,  No Fever-chills, No Headache, No changes with Vision or hearing, No problems swallowing food or Liquids, No Chest pain, Cough or Shortness of Breath, + Abdominal pain,+ Nausea or Vomiting, Constipated No Blood in stool or Urine, No dysuria, No new skin rashes or bruises, No new joints pains-aches,  No new weakness, tingling, numbness in any extremity, No recent weight gain or loss, No polyuria, polydypsia or polyphagia, No significant Mental Stressors.  A full 10 point Review of Systems was done, except as stated above, all other Review of Systems were negative.   Social History History  Substance Use Topics  . Smoking status: Former Smoker -- 1.0 packs/day for 20 years    Quit date: 03/19/1986  . Smokeless tobacco: Never Used  . Alcohol Use: No    Family History Family History  Problem Relation Age of Onset  . Heart disease Father   . Stroke Mother   . Heart disease Mother   . Colon cancer Neg Hx   . Heart disease Brother   . Breast cancer Maternal Aunt   . Heart disease Paternal Aunt   . Diabetes Paternal Grandfather      Prior to Admission medications   Medication Sig Start Date End Date Taking? Authorizing Provider  albuterol (PROAIR HFA) 108 (90 BASE) MCG/ACT inhaler Inhale 2 puffs into the lungs every 6 (six) hours as needed for wheezing. 05/22/11 05/21/12 Yes Coralyn Helling, MD  amiodarone (PACERONE) 200 MG tablet TAKE 1 TABLET BY MOUTH EVERY MORNING 12/11/11  Yes Rollene Rotunda, MD  azelastine (OPTIVAR) 0.05 % ophthalmic solution Place 1 drop into both eyes 2 (two) times daily. 10/15/11 10/14/12 Yes Yvonne R Lowne, DO  carvedilol (COREG) 6.25 MG tablet TAKE 1 TABLET BY MOUTH TWICE DAILY WITH A MEAL  10/11/11  Yes Rollene Rotunda, MD  cyclobenzaprine (FLEXERIL) 10 MG tablet TAKE 1 TABLET BY MOUTH THREE TIMES DAILY AS NEEDED FOR MUSCLE  SPASMS 11/15/11  Yes Lelon Perla, DO  etodolac (LODINE) 400 MG tablet Take 1 tablet by mouth 2 (two) times daily. 10/11/11  Yes Historical Provider, MD  fentaNYL (DURAGESIC - DOSED MCG/HR) 50 MCG/HR Place 1 patch (50 mcg total) onto the skin every 3 (three) days. 11/23/11  Yes Yvonne R Lowne, DO  furosemide (LASIX) 20 MG tablet Take 1 tablet (20 mg total) by mouth every morning. 05/15/11  Yes Rollene Rotunda, MD  gabapentin (NEURONTIN) 600 MG tablet  10/11/11  Yes Grayling Congress Lowne, DO  nitroGLYCERIN (NITROSTAT) 0.4 MG SL tablet Place 1 tablet (0.4 mg total) under the tongue every 5 (five) minutes as needed for chest pain. CHEST PAIN  03/14/11  Yes Rollene Rotunda, MD  oxyCODONE-acetaminophen (PERCOCET) 10-325 MG per tablet Take 1 tablet by mouth every 4 (four) hours as needed. PAIN 11/23/11  Yes Yvonne R Lowne, DO  pantoprazole (PROTONIX) 40 MG tablet Take 1 tablet (40 mg total) by mouth daily. 09/07/11  Yes Hart Carwin, MD  promethazine (PHENERGAN) 25 MG tablet Take 1 tablet (25 mg total) by mouth every 8 (eight) hours as needed for nausea. 12/10/11  Yes Yvonne R Lowne, DO  temazepam (RESTORIL) 15 MG capsule TAKE 1-2 CAPSULES BY MOUTH EVERY NIGHT AT BEDTIME AS NEEDED FOR SLEEP 10/31/11  Yes Yvonne R Lowne, DO  tiotropium (SPIRIVA HANDIHALER) 18 MCG inhalation capsule Place 1 capsule (18 mcg total) into inhaler and inhale every morning. 07/24/11  Yes Coralyn Helling, MD  denosumab (PROLIA) 60 MG/ML SOLN Inject 60 mg into the skin every 6 (six) months. Twice a year    Historical Provider, MD    Allergies  Allergen Reactions  . Dabigatran Etexilate Mesylate Other (See Comments)    INTERNAL BLEEDING  . Talwin (Pentazocine) Other (See Comments)    hallucinations  . Pentazocine Lactate Other (See Comments)    HALLUCINATIONS  . Sulfonamide Derivatives Other (See Comments)     JITTERINESS     Physical Exam  Vitals  Blood pressure 112/80, pulse 98, temperature 98.4 F (36.9 C), temperature source Oral, resp. rate 16, SpO2 98.00%.   1. General Elderly white female in pain  2. Normal affect and insight, Not Suicidal or Homicidal, Awake Alert, Oriented X 3.  3. No F.N deficits, ALL C.Nerves Intact, Strength 5/5 all 4 extremities, Sensation intact all 4 extremities, Plantars down going.  4. Ears and Eyes appear Normal, Conjunctivae clear, PERRLA. Moist Oral Mucosa.  5. Supple Neck, No JVD, No cervical lymphadenopathy appriciated, No Carotid Bruits.  6. Symmetrical Chest wall movement, Good air movement bilaterally, CTAB.  7. RRR, No Gallops, Rubs or Murmurs, No Parasternal Heave.  8.Decreased Bowel Sounds, Abdomen Distended TTP allover  No organomegaly appriciated  9.  No Cyanosis, Normal Skin Turgor, No Skin Rash or Bruise.  10. Good muscle tone,  joints appear normal , no effusions, Normal ROM.  11. No Palpable Lymph Nodes in Neck or Axillae   Data Review  CBC  Lab 12/11/11 1455  WBC 11.3*  HGB 13.3  HCT 40.6  PLT 365  MCV 91.0  MCH 29.8  MCHC 32.8  RDW 13.7  LYMPHSABS 0.8  MONOABS 1.4*  EOSABS 0.0  BASOSABS 0.0  BANDABS --   ------------------------------------------------------------------------------------------------------------------  Chemistries   Lab 12/11/11 1455 12/11/11 1445  NA 138 --  K 4.6 --  CL 97 --  CO2 27 --  GLUCOSE 100* --  BUN 35* --  CREATININE 0.82 --  CALCIUM 6.2* --  MG --  1.9  AST 16 --  ALT 13 --  ALKPHOS 85 --  BILITOT 0.3 --   ------------------------------------------------------------------------------------------------------------------ CrCl is unknown because both a height and weight (above a minimum accepted value) are required for this calculation. ------------------------------------------------------------------------------------------------------------------ No results found for  this basename: TSH,T4TOTAL,FREET3,T3FREE,THYROIDAB in the last 72 hours   Coagulation profile No results found for this basename: INR:5,PROTIME:5 in the last 168 hours ------------------------------------------------------------------------------------------------------------------- No results found for this basename: DDIMER:2 in the last 72 hours -------------------------------------------------------------------------------------------------------------------    ---------------------------------------------------------------------------------------------------------------  Urinalysis    Component Value Date/Time   COLORURINE AMBER* 12/11/2011 1818   APPEARANCEUR CLOUDY* 12/11/2011 1818   LABSPEC 1.025 12/11/2011 1818   PHURINE 5.5 12/11/2011 1818   GLUCOSEU NEGATIVE 12/11/2011 1818   HGBUR NEGATIVE 12/11/2011 1818   HGBUR negative 05/19/2010 1609   BILIRUBINUR LARGE* 12/11/2011 1818   BILIRUBINUR Large 10/15/2011 1115   KETONESUR TRACE* 12/11/2011 1818   PROTEINUR NEGATIVE 12/11/2011 1818   UROBILINOGEN 0.2 12/11/2011 1818   UROBILINOGEN 0.2 10/15/2011 1115   NITRITE NEGATIVE 12/11/2011 1818   NITRITE Positive 10/15/2011 1115   LEUKOCYTESUR MODERATE* 12/11/2011 1818    ----------------------------------------------------------------------------------------------------------------     Imaging results:   Dg Abd 1 View  12/11/2011  *RADIOLOGY REPORT*  Clinical Data: 66 year old female abdominal pain nausea vomiting.  ABDOMEN - 1 VIEW  Comparison: 11/03/2010.  Findings: Cardiac pacemaker lead partially visible.  Pelvic stimulator device re-identified.  Interval right hip arthroplasty. Osteopenia.  Somewhat featureless gas filled bowel loops in the mid and lower abdomen.  The more normal appearing gas filled bowel in the pelvis.  IMPRESSION: Featureless loops of gas-filled bowel in the mid abdomen. Mechanical bowel obstruction cannot be excluded. Gas pattern in the pelvis is more normal.    Original Report Authenticated By: Harley Hallmark, M.D.    Ct Abdomen Pelvis W Contrast  12/11/2011  *RADIOLOGY REPORT*  Clinical Data: History bowel obstruction.  Last bowel movement 4 days ago.  Nausea vomiting since on the evening.  Hypertension and COPD.  CT ABDOMEN AND PELVIS WITH CONTRAST  Technique:  Multidetector CT imaging of the abdomen and pelvis was performed following the standard protocol during bolus administration of intravenous contrast.  Contrast: 80mL OMNIPAQUE IOHEXOL 300 MG/ML  SOLN  Comparison: Plain film earlier today.  08/08/2010 CT.  Findings: Centrilobular emphysema at the lung bases.  Right lung base interstitial thickening with mild bronchiectasis, slightly progressive since 08/08/2010.  Normal heart size with dual lead pacer. No pericardial or pleural effusion.  No focal liver lesion.  Too small to characterize splenic lesion is unchanged.  Gallbladder is normal.  There is moderate intra hepatic biliary ductal dilatation.  The common duct is upper normal for patient age, maximally 7 mm.  No obstructive stone or mass is identified. The intrahepatic ductal dilatation is likely similar when compared to the prior unenhanced study.  The stomach is mildly dilated.  The duodenum is markedly dilated. Normal adrenal glands.  Moderate right renal atrophy with mild right hydronephrosis which is chronic.  No cause is identified. Mild left renal cortical atrophy.  No retroperitoneal or retrocrural adenopathy.  The rectum and distal sigmoid are stool-filled.  There has been a left hemicolectomy.  The proximal jejunum is moderately dilated and fluid-filled.  The mid small bowel is upper normal in caliber.  A probable transition in the right lower quadrant on images 59 - 48. No pneumatosis or free intraperitoneal air.  Beam hardening artifact degrades evaluation of the pelvis secondary to right hip arthroplasty. No  pelvic adenopathy.  Pelvic floor laxity.  Hysterectomy.  Normal urinary bladder.  No  definite adnexal mass or significant free pelvic fluid.  Moderate osteopenia.  IMPRESSION:  1.  High-grade partial small bowel obstruction.  Marked duodenal and moderate proximal small bowel dilatation with probable transition point in the distal small bowel.  Favor secondary to adhesions.  The patient would likely benefit from nasogastric tube placement. 2.  No evidence of ischemia or other acute complication. 3.  Centrilobular emphysema with worsened right base aeration. Cannot exclude superimposed infection or chronic aspiration. 4.  Chronic intrahepatic biliary ductal dilatation, without cause identified. 5.  Chronic right-sided hydroureteronephrosis, also without cause identified.  Mild.   Original Report Authenticated By: Consuello Bossier, M.D.     My personal review of EKG:   Assessment & Plan  Principal Problem:  *Protein-calorie malnutrition, moderate Active Problems:  Atrial fibrillation  Atrial flutter  SYSTOLIC HEART FAILURE, CHRONIC  COPD  GERD  Chronic hypoxemic respiratory failure  UTI (urinary tract infection)  Adhesions of intestine, very dense, with frozen abdomen  SBO (small bowel obstruction), recurrent    1. small bowel obstruction recurrent patient had multiple surgeries in the past for lysis of adhesions. The patient will be kept n.p.o. surgery was consulted Will give IV Protonix, IV Zofran when necessary, IV fluids . Pain control. please place NG if needed . 2. Atrial fibrillation/flutter; the patient cannot be on by mouth medications I will switch to IV Lopressor every 6 hours please follow 3. Congestive heart failure chronic systolic seems to be compensated 4. COPD 5.Hypocalcemia : Check Ionized Calcium 6. PCM  DVT Prophylaxis  Lovenox AM Labs Ordered, also please review Full Orders  Family Communication: Admission, patients condition and plan of care including tests being ordered have been discussed with the patient who indicate understanding and agree with  the plan and Code Status.  Code Status Full  Disposition Plan: Home to follow up with PMD  Time spent in minutes :35  Condition Fair

## 2011-12-11 NOTE — Telephone Encounter (Signed)
I think she should go thru the Er -and be admitted to hospitalist service-she has multiple other medical problems-02 dependent copd etc

## 2011-12-11 NOTE — Telephone Encounter (Signed)
Informed husband per Dr Juanda Chance and Mike Gip, PA, d/t to pt's complex medical problems and hx, they suggest she go to the ER and be admitted by a hospitalist. Husband stated understanding.

## 2011-12-11 NOTE — Consult Note (Signed)
MODESTINE SCHERZINGER  07-26-1945 [id number]  CARE TEAM:  PCP: Loreen Freud, DO  Outpatient Care Team: Patient Care Team: Lelon Perla, DO as PCP - General  Inpatient Treatment Team: Treatment Team: Attending Provider: Gwyneth Sprout, MD; Technician: Shade Flood, NT; Registered Nurse: Nigel Bridgeman, RN; Consulting Physician: Bishop Limbo, MD   This patient is a 66 y.o.female who presents today for surgical evaluation at the request of Dr. Anitra Lauth.   Reason for evaluation: Recurrent high-grade small bowel obstruction  Pleasant female with numerous prior abdominal surgeries Including hysterectomy, appendectomy, and partial colectomy.  History of small bowel obstructions in the past.  Had to undergo extensive lyses of adhesions for over four hours and 2012.  He denied could not be completed secondary to dense adhesions and poor tissue quality.  Eventually improved.  She has chronic intermittent distention and nausea.  Chronic pain.    Patient notes worsening abdominal pain and distention over past 4 days.  Has tended to be in the right lower side.  She began to have emesis.  Still a little flatus.  Last bowel movement a few days ago.  Based on concerns, she came emergency room.  Workup and CT scan very suspicious for bowel obstruction.  Urinalysis pushes for her urinary tract infection as well.  We are asked to be involved.  Has numerous health issues including his numerous pulmonary and cardiac issues.  On chronic antiarrhythmic medications  Patient Active Problem List  Diagnosis  . B12 DEFICIENCY  . DEHYDRATION  . ANEMIA-IRON DEFICIENCY  . RESTLESS LEG SYNDROME  . Chronic pain syndrome  . HYPERTENSION  . CARDIOMYOPATHY, PRIMARY, DILATED  . LBBB  . ATRIAL FIBRILLATION  . ATRIAL FLUTTER  . SYSTOLIC HEART FAILURE, CHRONIC  . COPD  . GERD  . GASTRITIS  . GASTROPARESIS  . GASTROENTERITIS  . DIVERTICULOSIS, COLON  . OSTEOARTHRITIS  . DEGENERATIVE DISC DISEASE  . FIBROMYALGIA    . OSTEOPOROSIS  . SYNCOPE  . CHRONIC FATIGUE SYNDROME  . NAUSEA, CHRONIC  . URINARY INCONTINENCE  . ABDOMINAL PAIN, CHRONIC  . ABDOMINAL PAIN-LLQ  . ABDOMINAL PAIN-PERIUMBILICAL  . NONSPECIFIC ABN FINDNG RAD&OTH EXAM BILARY TRCT  . Chronic hypoxemic respiratory failure  . DYSURIA  . UNSPECIFIED INTESTINAL OBSTRUCTION  . CRT- [employer]  . Deep venous thrombosis Left subclavian vein  . Recurrent pneumonia  . Anemia  . Bilateral leg weakness  . B12 deficiency  . Degenerative arthritis of hip  . UTI (urinary tract infection)  . Allergic conjunctivitis  . Adhesions of intestine, very dense, with frozen abdomen    Past Medical History  Diagnosis Date  . GERD (gastroesophageal reflux disease)   . CAD (coronary artery disease)     mild disease per cath in 2011  . LBBB (left bundle branch block)   . Atrial fibrillation     on amiodarone  . Venous embolism and thrombosis of subclavian vein     after pacemaker insertion Oct 2011  . Pleural effusion      s/p right thoracentesis 03/09  . Small bowel obstruction   . Urinary incontinence      -INTERSTIM IMPLANT NOT FUNCTIONING PER PT  . RLS (restless legs syndrome)   . Primary dilated cardiomyopathy     EF 45 to 50% per echo in Jan 2012  . Vitamin B12 deficiency   . Gastroparesis   . Chronic nausea   . Chronic abdominal pain   . Chronic pain syndrome   . Pacemaker  CRT therapy; followed by Dr. Graciela Husbands  . H/O: GI bleed     from Pradaxa  . Oxygen dependent     2 liters via nasal cannula at all times  . CHF (congestive heart failure)   . Anemia   . Hx of stress fx aug 2011    right hip   . Elbow fracture, left aug 2012  . Arthritis     right hip  . Hx of gastric ulcer   . Diverticulitis   . HTN (hypertension)     under control; has been on med. x "years"  . COPD (chronic obstructive pulmonary disease)     continuous O2  . Hx MRSA infection   . Pneumonia - 03/09, 12/09, 02/10, 06/10    MOST RECENT FEB 2013  .  Elevated liver enzymes   . Blood transfusion   . Anginal pain   . Shortness of breath   . Fibromyalgia     COUPLE OF TIMES A YEAR-PT HAS EPISODES OF CONFUSION-USUALLY INVOLVES DAY/NIGHT REVERSAL AND EPISODES OF TWITCHING AND FALLS--SEES DR. Modesto Charon - NEUROLOGIST-LAST SEEN 07/10/11    Past Surgical History  Procedure Date  . Appendectomy   . Pacemaker placement 12/21/2009  . Peg removed     with complications  . Tonsillectomy age 35  . Interstim implant revision 03/06/2011    Procedure: REVISION OF Leane Platt;  Surgeon: Martina Sinner, MD;  Location: [facility];  Service: Urology;  Laterality: N/A;  Replacement of Neurostimulator  . Portacath placement 12/16/2009  . Total abdominal hysterectomy     complete  . Interstim implant placement 05/28/2006 - stage I    06/05/2006 - stage II  . Interstim implant revision 10/23/2007  . Cystoscopy with injection 04/30/2006    transurethral collagen injection; incision vaginal stenosis  . Small intestine surgery 05/20/2001    ex. lap., resection of small bowel stricture; gastrostomy; insertion central line  . Cardiac catheterization 04/08/2006, 11/16/2009  . Cystoscopy 11/11/2008  . Cystoscopy w/ retrogrades 11/11/2008    right  . Colectomy     Intestinal resection (5times)  . Colectomy 02/04/2000    ex. lap., intra-abd. subtotal colectomy with ileosigmoid colon anastomosies and lysis of adhesions  . Gastrocutaneous fistula closure   . Exploratory laparotomy 04/27/2009    lysis of adhesions, gastrostomy tube  . Port-a-cath removal 07/27/2011    Procedure: REMOVAL PORT-A-CATH;  Surgeon: Emelia Loron, MD;  Location: [facility];  Service: General;  Laterality: N/A;  . Total hip arthroplasty 08/03/2011    Procedure: TOTAL HIP ARTHROPLASTY ANTERIOR APPROACH;  Surgeon: Kathryne Hitch, MD;  Location: [facility];  Service: Orthopedics;  Laterality: Right;  . Joint replacement 5/13    right total hip    History   Social History  . Marital  Status: Married    Spouse Name: N/A    Number of Children: 5  . Years of Education: N/A   Occupational History  . disabled    Social History Main Topics  . Smoking status: Former Smoker -- 1.0 packs/day for 20 years    Quit date: 03/19/1986  . Smokeless tobacco: Never Used  . Alcohol Use: No  . Drug Use: No  . Sexually Active: Not on file   Other Topics Concern  . Not on file   Social History Narrative   Occupation: DisabledAlcohol Use - noIllicit Drug Use - noPatient is a former smoker, quit 1988 x37yrs, <1ppd. Daily Caffeine Use    Family History  Problem Relation Age  of Onset  . Heart disease Father   . Stroke Mother   . Heart disease Mother   . Colon cancer Neg Hx   . Heart disease Brother   . Breast cancer Maternal Aunt   . Heart disease Paternal Aunt   . Diabetes Paternal Grandfather     Current Facility-Administered Medications  Medication Dose Route Frequency Provider Last Rate Last Dose  . acetaminophen (TYLENOL) suppository 650 mg  650 mg Rectal Q6H PRN Ardeth Sportsman, MD      . alum & mag hydroxide-simeth (MAALOX/MYLANTA) 200-200-20 MG/5ML suspension 30 mL  30 mL Oral Q6H PRN Ardeth Sportsman, MD      . bisacodyl (DULCOLAX) suppository 10 mg  10 mg Rectal Daily Ardeth Sportsman, MD      . cyanocobalamin ((VITAMIN B-12)) injection 1,000 mcg  1,000 mcg Intramuscular Q30 days Hart Carwin, MD   1,000 mcg at 11/12/11 1109  . diphenhydrAMINE (BENADRYL) injection 12.5-25 mg  12.5-25 mg Intravenous Q6H PRN Ardeth Sportsman, MD      . fentaNYL (SUBLIMAZE) injection 25-50 mcg  25-50 mcg Intravenous Q1H PRN Ardeth Sportsman, MD      . HYDROmorphone (DILAUDID) injection 1 mg  1 mg Intravenous Once Gwyneth Sprout, MD   1 mg at 12/11/11 1902  . iohexol (OMNIPAQUE) 300 MG/ML solution 80 mL  80 mL Intravenous Once PRN Medication Radiologist, MD   80 mL at 12/11/11 2006  . lactated ringers bolus 1,000 mL  1,000 mL Intravenous Once Ardeth Sportsman, MD      . lip balm (CARMEX)  ointment 1 application  1 application Topical BID Ardeth Sportsman, MD      . magic mouthwash  15 mL Oral QID PRN Ardeth Sportsman, MD      . menthol-cetylpyridinium (CEPACOL) lozenge 3 mg  1 lozenge Oral PRN Ardeth Sportsman, MD      . ondansetron [facility]) injection 4 mg  4 mg Intravenous Once Hurman Horn, MD   4 mg at 12/11/11 1835  . ondansetron (ZOFRAN) injection 4 mg  4 mg Intravenous Once Gwyneth Sprout, MD   4 mg at 12/11/11 2021  . ondansetron (ZOFRAN) injection 4-8 mg  4-8 mg Intravenous Q6H PRN Ardeth Sportsman, MD      . phenol (CHLORASEPTIC) mouth spray 2 spray  2 spray Mouth/Throat PRN Ardeth Sportsman, MD      . promethazine (PHENERGAN) injection 12.5-25 mg  12.5-25 mg Intravenous Q6H PRN Ardeth Sportsman, MD      . sodium chloride 0.9 % bolus 1,000 mL  1,000 mL Intravenous Once Gwyneth Sprout, MD   1,000 mL at 12/11/11 1756   Current Outpatient Prescriptions  Medication Sig Dispense Refill  . albuterol (PROAIR HFA) 108 (90 BASE) MCG/ACT inhaler Inhale 2 puffs into the lungs every 6 (six) hours as needed for wheezing.  1 Inhaler  5  . amiodarone (PACERONE) 200 MG tablet TAKE 1 TABLET BY MOUTH EVERY MORNING  30 tablet  5  . azelastine (OPTIVAR) 0.05 % ophthalmic solution Place 1 drop into both eyes 2 (two) times daily.  6 mL  12  . carvedilol (COREG) 6.25 MG tablet TAKE 1 TABLET BY MOUTH TWICE DAILY WITH A MEAL  60 tablet  3  . cyclobenzaprine (FLEXERIL) 10 MG tablet TAKE 1 TABLET BY MOUTH THREE TIMES DAILY AS NEEDED FOR MUSCLE SPASMS  60 tablet  0  . etodolac (LODINE) 400 MG tablet Take 1 tablet by  mouth 2 (two) times daily.      . fentaNYL (DURAGESIC - DOSED MCG/HR) 50 MCG/HR Place 1 patch (50 mcg total) onto the skin every 3 (three) days.  10 patch  0  . furosemide (LASIX) 20 MG tablet Take 1 tablet (20 mg total) by mouth every morning.  30 tablet  6  . gabapentin (NEURONTIN) 600 MG tablet       . nitroGLYCERIN (NITROSTAT) 0.4 MG SL tablet Place 1 tablet (0.4 mg total) under the  tongue every 5 (five) minutes as needed for chest pain. CHEST PAIN   25 tablet  11  . oxyCODONE-acetaminophen (PERCOCET) 10-325 MG per tablet Take 1 tablet by mouth every 4 (four) hours as needed. PAIN  120 tablet  0  . pantoprazole (PROTONIX) 40 MG tablet Take 1 tablet (40 mg total) by mouth daily.  30 tablet  3  . promethazine (PHENERGAN) 25 MG tablet Take 1 tablet (25 mg total) by mouth every 8 (eight) hours as needed for nausea.  40 tablet  0  . temazepam (RESTORIL) 15 MG capsule TAKE 1-2 CAPSULES BY MOUTH EVERY NIGHT AT BEDTIME AS NEEDED FOR SLEEP  60 capsule  0  . tiotropium (SPIRIVA HANDIHALER) 18 MCG inhalation capsule Place 1 capsule (18 mcg total) into inhaler and inhale every morning.  30 capsule  5  . DISCONTD: gabapentin (NEURONTIN) 600 MG tablet TAKE 1 TABLET BY MOUTH FOUR TIMES DAILY  120 tablet  1  . denosumab (PROLIA) 60 MG/ML SOLN Inject 60 mg into the skin every 6 (six) months. Twice a year      . DISCONTD: amiodarone (PACERONE) 200 MG tablet Take 1 tablet (200 mg total) by mouth every morning.  30 tablet  6  . DISCONTD: iron dextran complex (INFED) 50 MG/ML injection Please give infed infusion (no test dose needed) over 4 hours per pharmacy calculated dose. Ht: 5'2 Wt: 99 pounds Hgb: 12  1 mL  0     Allergies  Allergen Reactions  . Dabigatran Etexilate Mesylate Other (See Comments)    INTERNAL BLEEDING  . Talwin (Pentazocine) Other (See Comments)    hallucinations  . Pentazocine Lactate Other (See Comments)    HALLUCINATIONS  . Sulfonamide Derivatives Other (See Comments)    JITTERINESS     ROS: Constitutional:  No fevers, chills, sweats.  Weight stable Eyes:  No vision changes, No discharge HENT:  No sore throats, nasal drainage Lymph: No neck swelling, No bruising easily Pulmonary:  No cough, productive sputum CV: No orthopnea, PND  Patient walks 20 minutes for about 1/2 miles without difficulty.  No exertional chest/neck/shoulder/arm pain.  GI:  No personal  nor family history of GI/colon cancer, inflammatory bowel disease, irritable bowel syndrome, allergy such as Celiac Sprue, dietary/dairy problems, colitis.  No recent sick contacts/gastroenteritis.  No travel outside the country.  No changes in diet.  GI bleed from Pradaxa.  H/o gastric ulcer  Renal: No UTIs, No hematuria Genital:  No drainage, bleeding, masses Musculoskeletal: No severe joint pain.  Good ROM major joints Skin:  No sores or lesions.  No rashes Heme/Lymph:  No easy bleeding.  No swollen lymph nodes  BP 112/80  Pulse 98  Temp 98.4 F (36.9 C) (Oral)  Resp 16  SpO2 98%  Physical Exam: General: Pt awake/alert/oriented x4 in no major acute distress.  Thin, mildly cachectic. Eyes: PERRL, normal EOM. Sclera nonicteric Neuro: CN II-XII intact w/o focal sensory/motor deficits. Lymph: No head/neck/groin lymphadenopathy Psych:  No delerium/psychosis/paranoia HENT:  Normocephalic, Mucus membranes moist.  No thrush Neck: Supple, No tracheal deviation Chest: No pain.  Good respiratory excursion. CV:  Pulses intact.  Irregular rhythm but no tachycardia Abdomen: Somewhat firm.  Soft, Moderately distended.  Numerous transverse & midline abd wall incisions.  No fistula.  No hernias.   Mildly tender but no severe peritonitis.  . Ext:  SCDs BLE.  No significant edema.  No cyanosis Skin: No petechiae / purpurae  Results:   Labs: Results for orders placed during the hospital encounter of 12/11/11 (from the past 48 hour(s))  CBC WITH DIFFERENTIAL     Status: Abnormal   Collection Time   12/11/11  2:55 PM      Component Value Range Comment   WBC 11.3 (*) 4.0 - 10.5 K/uL    RBC 4.46  3.87 - 5.11 MIL/uL    Hemoglobin 13.3  12.0 - 15.0 g/dL    HCT 78.2  95.6 - 21.3 %    MCV 91.0  78.0 - 100.0 fL    MCH 29.8  26.0 - 34.0 pg    MCHC 32.8  30.0 - 36.0 g/dL    RDW 08.6  57.8 - 46.9 %    Platelets 365  150 - 400 K/uL    Neutrophils Relative 81 (*) 43 - 77 %    Neutro Abs 9.1 (*) 1.7 -  7.7 K/uL    Lymphocytes Relative 7 (*) 12 - 46 %    Lymphs Abs 0.8  0.7 - 4.0 K/uL    Monocytes Relative 12  3 - 12 %    Monocytes Absolute 1.4 (*) 0.1 - 1.0 K/uL    Eosinophils Relative 0  0 - 5 %    Eosinophils Absolute 0.0  0.0 - 0.7 K/uL    Basophils Relative 0  0 - 1 %    Basophils Absolute 0.0  0.0 - 0.1 K/uL   COMPREHENSIVE METABOLIC PANEL     Status: Abnormal   Collection Time   12/11/11  2:55 PM      Component Value Range Comment   Sodium 138  135 - 145 mEq/L    Potassium 4.6  3.5 - 5.1 mEq/L    Chloride 97  96 - 112 mEq/L    CO2 27  19 - 32 mEq/L    Glucose, Bld 100 (*) 70 - 99 mg/dL    BUN 35 (*) 6 - 23 mg/dL    Creatinine, Ser 6.29  0.50 - 1.10 mg/dL    Calcium 6.2 (*) 8.4 - 10.5 mg/dL    Total Protein 5.9 (*) 6.0 - 8.3 g/dL    Albumin 2.4 (*) 3.5 - 5.2 g/dL    AST 16  0 - 37 U/L    ALT 13  0 - 35 U/L    Alkaline Phosphatase 85  39 - 117 U/L    Total Bilirubin 0.3  0.3 - 1.2 mg/dL    GFR calc non Af Amer 72 (*) >90 mL/min    GFR calc Af Amer 84 (*) >90 mL/min   LIPASE, BLOOD     Status: Abnormal   Collection Time   12/11/11  2:55 PM      Component Value Range Comment   Lipase 8 (*) 11 - 59 U/L   URINALYSIS, ROUTINE W REFLEX MICROSCOPIC     Status: Abnormal   Collection Time   12/11/11  6:18 PM      Component Value Range Comment   Color, Urine AMBER (*) YELLOW BIOCHEMICALS  MAY BE AFFECTED BY COLOR   APPearance CLOUDY (*) CLEAR    Specific Gravity, Urine 1.025  1.005 - 1.030    pH 5.5  5.0 - 8.0    Glucose, UA NEGATIVE  NEGATIVE mg/dL    Hgb urine dipstick NEGATIVE  NEGATIVE    Bilirubin Urine LARGE (*) NEGATIVE    Ketones, ur TRACE (*) NEGATIVE mg/dL    Protein, ur NEGATIVE  NEGATIVE mg/dL    Urobilinogen, UA 0.2  0.0 - 1.0 mg/dL    Nitrite NEGATIVE  NEGATIVE    Leukocytes, UA MODERATE (*) NEGATIVE   URINE MICROSCOPIC-ADD ON     Status: Abnormal   Collection Time   12/11/11  6:18 PM      Component Value Range Comment   Squamous Epithelial / LPF FEW (*)  RARE    WBC, UA 11-20  <3 WBC/hpf    Bacteria, UA MANY (*) RARE    Urine-Other MUCOUS PRESENT       Imaging / Studies: Dg Abd 1 View  12/11/2011  *RADIOLOGY REPORT*  Clinical Data: 66 year old female abdominal pain nausea vomiting.  ABDOMEN - 1 VIEW  Comparison: 11/03/2010.  Findings: Cardiac pacemaker lead partially visible.  Pelvic stimulator device re-identified.  Interval right hip arthroplasty. Osteopenia.  Somewhat featureless gas filled bowel loops in the mid and lower abdomen.  The more normal appearing gas filled bowel in the pelvis.  IMPRESSION: Featureless loops of gas-filled bowel in the mid abdomen. Mechanical bowel obstruction cannot be excluded. Gas pattern in the pelvis is more normal.   Original Report Authenticated By: Harley Hallmark, M.D.    Ct Abdomen Pelvis W Contrast  12/11/2011  *RADIOLOGY REPORT*  Clinical Data: History bowel obstruction.  Last bowel movement 4 days ago.  Nausea vomiting since on the evening.  Hypertension and COPD.  CT ABDOMEN AND PELVIS WITH CONTRAST  Technique:  Multidetector CT imaging of the abdomen and pelvis was performed following the standard protocol during bolus administration of intravenous contrast.  Contrast: 80mL OMNIPAQUE IOHEXOL 300 MG/ML  SOLN  Comparison: Plain film earlier today.  08/08/2010 CT.  Findings: Centrilobular emphysema at the lung bases.  Right lung base interstitial thickening with mild bronchiectasis, slightly progressive since 08/08/2010.  Normal heart size with dual lead pacer. No pericardial or pleural effusion.  No focal liver lesion.  Too small to characterize splenic lesion is unchanged.  Gallbladder is normal.  There is moderate intra hepatic biliary ductal dilatation.  The common duct is upper normal for patient age, maximally 7 mm.  No obstructive stone or mass is identified. The intrahepatic ductal dilatation is likely similar when compared to the prior unenhanced study.  The stomach is mildly dilated.  The duodenum is  markedly dilated. Normal adrenal glands.  Moderate right renal atrophy with mild right hydronephrosis which is chronic.  No cause is identified. Mild left renal cortical atrophy.  No retroperitoneal or retrocrural adenopathy.  The rectum and distal sigmoid are stool-filled.  There has been a left hemicolectomy.  The proximal jejunum is moderately dilated and fluid-filled.  The mid small bowel is upper normal in caliber.  A probable transition in the right lower quadrant on images 59 - 48. No pneumatosis or free intraperitoneal air.  Beam hardening artifact degrades evaluation of the pelvis secondary to right hip arthroplasty. No pelvic adenopathy.  Pelvic floor laxity.  Hysterectomy.  Normal urinary bladder.  No definite adnexal mass or significant free pelvic fluid.  Moderate osteopenia.  IMPRESSION:  1.  High-grade partial small bowel obstruction.  Marked duodenal and moderate proximal small bowel dilatation with probable transition point in the distal small bowel.  Favor secondary to adhesions.  The patient would likely benefit from nasogastric tube placement. 2.  No evidence of ischemia or other acute complication. 3.  Centrilobular emphysema with worsened right base aeration. Cannot exclude superimposed infection or chronic aspiration. 4.  Chronic intrahepatic biliary ductal dilatation, without cause identified. 5.  Chronic right-sided hydroureteronephrosis, also without cause identified.  Mild.   Original Report Authenticated By: Consuello Bossier, M.D.     Medications / Allergies: per chart  Antibiotics: Anti-infectives    None      Assessment  Devonne Doughty  66 y.o. female       Problem List:  Principal Problem:  *SBO (small bowel obstruction), recurrent Active Problems:  Adhesions of intestine, very dense, with frozen abdomen  Protein-calorie malnutrition, moderate  Atrial fibrillation  Atrial flutter  SYSTOLIC HEART FAILURE, CHRONIC  COPD  GERD  Chronic hypoxemic respiratory  failure  UTI (urinary tract infection)  Recurrent SBO In a poor operative candidate from a cardiac/pulmonary standpoint as well as a frozen abdomen with dense adhesions.  Plan:  Medical admission give her in her numerous comorbidities and health problems.  Treat UTI  We will follow in consultation  Nasogastric tube decompression.  Hold off on replacement gastrostomy tube at this point.  Followup x-ray studies.  Given the fact that it is late Tuesday night would wait till Thursday morning.  Hold off on surgery as a last resort Given her dense adhesions and very challenging surgery the last time.  Would be high risk for small bowel resection and fistula formation.  Nutrition studies are to show evidence of malnutrition.  Hold off on the parental nutrition but later may need to be an option.  -VTE prophylaxis- SCDs, etc  -mobilize as tolerated to help recovery  Ardeth Sportsman, M.D., F.A.C.S. Gastrointestinal and Minimally Invasive Surgery [facility] [address], [address] [place], [place] [address] [phone] Main / Paging [phone] Voice Mail   12/11/2011

## 2011-12-11 NOTE — ED Provider Notes (Addendum)
History     CSN: [id number]  Arrival date & time 12/11/11  1347   First MD Initiated Contact with Patient 12/11/11 1823      Chief Complaint  Patient presents with  . Nausea  . Emesis    (Consider location/radiation/quality/duration/timing/severity/associated sxs/prior treatment) Patient is a 66 y.o. female presenting with abdominal pain. The history is provided by the patient.  Abdominal Pain The primary symptoms of the illness include abdominal pain, nausea and vomiting. The primary symptoms of the illness do not include fever, diarrhea or dysuria. Episode onset: 3-4 days ago. The onset of the illness was gradual. The problem has been gradually worsening.  The abdominal pain is located in the periumbilical region. The abdominal pain does not radiate. The severity of the abdominal pain is 7/10. The abdominal pain is relieved by nothing. The abdominal pain is exacerbated by eating and vomiting.  Vomiting occurs 2 to 5 times per day. The emesis contains stomach contents (brown material).  Associated with: no alcohol or nsaid use. The patient has had a change in bowel habit (last bm was 1 week ago). Additional symptoms associated with the illness include anorexia. Symptoms associated with the illness do not include constipation. Associated medical issues comments: hx of multiple surgeries due to bowel obstruction.    Past Medical History  Diagnosis Date  . GERD (gastroesophageal reflux disease)   . CAD (coronary artery disease)     mild disease per cath in 2011  . LBBB (left bundle branch block)   . Atrial fibrillation     on amiodarone  . Venous embolism and thrombosis of subclavian vein     after pacemaker insertion Oct 2011  . Pleural effusion      s/p right thoracentesis 03/09  . Small bowel obstruction   . Urinary incontinence      -INTERSTIM IMPLANT NOT FUNCTIONING PER PT  . RLS (restless legs syndrome)   . Primary dilated cardiomyopathy     EF 45 to 50% per echo in Jan  2012  . Vitamin B12 deficiency   . Gastroparesis   . Chronic nausea   . Chronic abdominal pain   . Chronic pain syndrome   . Pacemaker     CRT therapy; followed by Dr. Graciela Husbands  . H/O: GI bleed     from Pradaxa  . Oxygen dependent     2 liters via nasal cannula at all times  . CHF (congestive heart failure)   . Anemia   . Hx of stress fx aug 2011    right hip   . Elbow fracture, left aug 2012  . Arthritis     right hip  . Hx of gastric ulcer   . Diverticulitis   . HTN (hypertension)     under control; has been on med. x "years"  . COPD (chronic obstructive pulmonary disease)     continuous O2  . Hx MRSA infection   . Pneumonia - 03/09, 12/09, 02/10, 06/10    MOST RECENT FEB 2013  . Elevated liver enzymes   . Blood transfusion   . Anginal pain   . Shortness of breath   . Fibromyalgia     COUPLE OF TIMES A YEAR-PT HAS EPISODES OF CONFUSION-USUALLY INVOLVES DAY/NIGHT REVERSAL AND EPISODES OF TWITCHING AND FALLS--SEES DR. Modesto Charon - NEUROLOGIST-LAST SEEN 07/10/11    Past Surgical History  Procedure Date  . Appendectomy   . Pacemaker placement 12/21/2009  . Peg removed     with complications  .  Tonsillectomy age 58  . Interstim implant revision 03/06/2011    Procedure: REVISION OF Leane Platt;  Surgeon: Martina Sinner, MD;  Location: [facility];  Service: Urology;  Laterality: N/A;  Replacement of Neurostimulator  . Portacath placement 12/16/2009  . Total abdominal hysterectomy     complete  . Interstim implant placement 05/28/2006 - stage I    06/05/2006 - stage II  . Interstim implant revision 10/23/2007  . Cystoscopy with injection 04/30/2006    transurethral collagen injection; incision vaginal stenosis  . Small intestine surgery 05/20/2001    ex. lap., resection of small bowel stricture; gastrostomy; insertion central line  . Cardiac catheterization 04/08/2006, 11/16/2009  . Cystoscopy 11/11/2008  . Cystoscopy w/ retrogrades 11/11/2008    right  . Colectomy     Intestinal  resection (5times)  . Colectomy 02/04/2000    ex. lap., intra-abd. subtotal colectomy with ileosigmoid colon anastomosies and lysis of adhesions  . Gastrocutaneous fistula closure   . Exploratory laparotomy 04/27/2009    lysis of adhesions, gastrostomy tube  . Port-a-cath removal 07/27/2011    Procedure: REMOVAL PORT-A-CATH;  Surgeon: Emelia Loron, MD;  Location: [facility];  Service: General;  Laterality: N/A;  . Total hip arthroplasty 08/03/2011    Procedure: TOTAL HIP ARTHROPLASTY ANTERIOR APPROACH;  Surgeon: Kathryne Hitch, MD;  Location: [facility];  Service: Orthopedics;  Laterality: Right;  . Joint replacement 5/13    right total hip    Family History  Problem Relation Age of Onset  . Heart disease Father   . Stroke Mother   . Heart disease Mother   . Colon cancer Neg Hx   . Heart disease Brother   . Breast cancer Maternal Aunt   . Heart disease Paternal Aunt   . Diabetes Paternal Grandfather     History  Substance Use Topics  . Smoking status: Former Smoker -- 1.0 packs/day for 20 years    Quit date: 03/19/1986  . Smokeless tobacco: Never Used  . Alcohol Use: No    OB History    Grav Para Term Preterm Abortions TAB SAB Ect Mult Living                  Review of Systems  Constitutional: Negative for fever.  Gastrointestinal: Positive for nausea, vomiting, abdominal pain and anorexia. Negative for diarrhea and constipation.  Genitourinary: Negative for dysuria.  All other systems reviewed and are negative.    Allergies  Dabigatran etexilate mesylate; Talwin; Pentazocine lactate; and Sulfonamide derivatives  Home Medications   Current Outpatient Rx  Name Route Sig Dispense Refill  . ALBUTEROL SULFATE HFA 108 (90 BASE) MCG/ACT IN AERS Inhalation Inhale 2 puffs into the lungs every 6 (six) hours as needed for wheezing. 1 Inhaler 5  . AMIODARONE HCL 200 MG PO TABS  TAKE 1 TABLET BY MOUTH EVERY MORNING 30 tablet 5    .Marland KitchenPatient needs to  contact office to schedule  App ...  . AZELASTINE HCL 0.05 % OP SOLN Both Eyes Place 1 drop into both eyes 2 (two) times daily. 6 mL 12  . CARVEDILOL 6.25 MG PO TABS  TAKE 1 TABLET BY MOUTH TWICE DAILY WITH A MEAL 60 tablet 3    Please call the office to make appointment for Sep ...  . CYCLOBENZAPRINE HCL 10 MG PO TABS  TAKE 1 TABLET BY MOUTH THREE TIMES DAILY AS NEEDED FOR MUSCLE SPASMS 60 tablet 0  . ETODOLAC 400 MG PO TABS Oral Take 1 tablet  by mouth 2 (two) times daily.    . FENTANYL 50 MCG/HR TD PT72 Transdermal Place 1 patch (50 mcg total) onto the skin every 3 (three) days. 10 patch 0  . FUROSEMIDE 20 MG PO TABS Oral Take 1 tablet (20 mg total) by mouth every morning. 30 tablet 6  . GABAPENTIN 600 MG PO TABS      . NITROGLYCERIN 0.4 MG SL SUBL Sublingual Place 1 tablet (0.4 mg total) under the tongue every 5 (five) minutes as needed for chest pain. CHEST PAIN  25 tablet 11  . OXYCODONE-ACETAMINOPHEN 10-325 MG PO TABS Oral Take 1 tablet by mouth every 4 (four) hours as needed. PAIN 120 tablet 0  . PANTOPRAZOLE SODIUM 40 MG PO TBEC Oral Take 1 tablet (40 mg total) by mouth daily. 30 tablet 3  . PROMETHAZINE HCL 25 MG PO TABS Oral Take 1 tablet (25 mg total) by mouth every 8 (eight) hours as needed for nausea. 40 tablet 0  . TEMAZEPAM 15 MG PO CAPS  TAKE 1-2 CAPSULES BY MOUTH EVERY NIGHT AT BEDTIME AS NEEDED FOR SLEEP 60 capsule 0  . TIOTROPIUM BROMIDE MONOHYDRATE 18 MCG IN CAPS Inhalation Place 1 capsule (18 mcg total) into inhaler and inhale every morning. 30 capsule 5  . DENOSUMAB 60 MG/ML Crellin SOLN Subcutaneous Inject 60 mg into the skin every 6 (six) months. Twice a year      BP 112/80  Pulse 98  Temp 98.4 F (36.9 C) (Oral)  Resp 16  SpO2 98%  Physical Exam  Nursing note and vitals reviewed. Constitutional: She is oriented to person, place, and time. She appears well-developed and well-nourished. No distress.  HENT:  Head: Normocephalic and atraumatic.  Mouth/Throat: Oropharynx  is clear and moist. Mucous membranes are dry.  Eyes: Conjunctivae normal and EOM are normal. Pupils are equal, round, and reactive to light.  Neck: Normal range of motion. Neck supple.  Cardiovascular: Regular rhythm and intact distal pulses.  Tachycardia present.   No murmur heard. Pulmonary/Chest: Effort normal and breath sounds normal. No respiratory distress. She has no wheezes. She has no rales.  Abdominal: Soft. She exhibits abdominal bruit. She exhibits no distension, no ascites and no mass. Bowel sounds are decreased. There is tenderness in the periumbilical area and left lower quadrant. There is no rebound, no guarding and no CVA tenderness.  Musculoskeletal: Normal range of motion. She exhibits no edema and no tenderness.  Neurological: She is alert and oriented to person, place, and time.  Skin: Skin is warm and dry. No rash noted. No erythema.  Psychiatric: She has a normal mood and affect. Her behavior is normal.    ED Course  Procedures (including critical care time)  Labs Reviewed  CBC WITH DIFFERENTIAL - Abnormal; Notable for the following:    WBC 11.3 (*)     Neutrophils Relative 81 (*)     Neutro Abs 9.1 (*)     Lymphocytes Relative 7 (*)     Monocytes Absolute 1.4 (*)     All other components within normal limits  COMPREHENSIVE METABOLIC PANEL - Abnormal; Notable for the following:    Glucose, Bld 100 (*)     BUN 35 (*)     Calcium 6.2 (*)     Total Protein 5.9 (*)     Albumin 2.4 (*)     GFR calc non Af Amer 72 (*)     GFR calc Af Amer 84 (*)     All other components  within normal limits  LIPASE, BLOOD - Abnormal; Notable for the following:    Lipase 8 (*)     All other components within normal limits  URINALYSIS, ROUTINE W REFLEX MICROSCOPIC - Abnormal; Notable for the following:    Color, Urine AMBER (*)  BIOCHEMICALS MAY BE AFFECTED BY COLOR   APPearance CLOUDY (*)     Bilirubin Urine LARGE (*)     Ketones, ur TRACE (*)     Leukocytes, UA MODERATE (*)      All other components within normal limits  URINE MICROSCOPIC-ADD ON - Abnormal; Notable for the following:    Squamous Epithelial / LPF FEW (*)     Bacteria, UA MANY (*)     All other components within normal limits   Dg Abd 1 View  12/11/2011  *RADIOLOGY REPORT*  Clinical Data: 66 year old female abdominal pain nausea vomiting.  ABDOMEN - 1 VIEW  Comparison: 11/03/2010.  Findings: Cardiac pacemaker lead partially visible.  Pelvic stimulator device re-identified.  Interval right hip arthroplasty. Osteopenia.  Somewhat featureless gas filled bowel loops in the mid and lower abdomen.  The more normal appearing gas filled bowel in the pelvis.  IMPRESSION: Featureless loops of gas-filled bowel in the mid abdomen. Mechanical bowel obstruction cannot be excluded. Gas pattern in the pelvis is more normal.   Original Report Authenticated By: Harley Hallmark, M.D.    Ct Abdomen Pelvis W Contrast  12/11/2011  *RADIOLOGY REPORT*  Clinical Data: History bowel obstruction.  Last bowel movement 4 days ago.  Nausea vomiting since on the evening.  Hypertension and COPD.  CT ABDOMEN AND PELVIS WITH CONTRAST  Technique:  Multidetector CT imaging of the abdomen and pelvis was performed following the standard protocol during bolus administration of intravenous contrast.  Contrast: 80mL OMNIPAQUE IOHEXOL 300 MG/ML  SOLN  Comparison: Plain film earlier today.  08/08/2010 CT.  Findings: Centrilobular emphysema at the lung bases.  Right lung base interstitial thickening with mild bronchiectasis, slightly progressive since 08/08/2010.  Normal heart size with dual lead pacer. No pericardial or pleural effusion.  No focal liver lesion.  Too small to characterize splenic lesion is unchanged.  Gallbladder is normal.  There is moderate intra hepatic biliary ductal dilatation.  The common duct is upper normal for patient age, maximally 7 mm.  No obstructive stone or mass is identified. The intrahepatic ductal dilatation is likely  similar when compared to the prior unenhanced study.  The stomach is mildly dilated.  The duodenum is markedly dilated. Normal adrenal glands.  Moderate right renal atrophy with mild right hydronephrosis which is chronic.  No cause is identified. Mild left renal cortical atrophy.  No retroperitoneal or retrocrural adenopathy.  The rectum and distal sigmoid are stool-filled.  There has been a left hemicolectomy.  The proximal jejunum is moderately dilated and fluid-filled.  The mid small bowel is upper normal in caliber.  A probable transition in the right lower quadrant on images 59 - 48. No pneumatosis or free intraperitoneal air.  Beam hardening artifact degrades evaluation of the pelvis secondary to right hip arthroplasty. No pelvic adenopathy.  Pelvic floor laxity.  Hysterectomy.  Normal urinary bladder.  No definite adnexal mass or significant free pelvic fluid.  Moderate osteopenia.  IMPRESSION:  1.  High-grade partial small bowel obstruction.  Marked duodenal and moderate proximal small bowel dilatation with probable transition point in the distal small bowel.  Favor secondary to adhesions.  The patient would likely benefit from nasogastric tube placement. 2.  No evidence of ischemia or other acute complication. 3.  Centrilobular emphysema with worsened right base aeration. Cannot exclude superimposed infection or chronic aspiration. 4.  Chronic intrahepatic biliary ductal dilatation, without cause identified. 5.  Chronic right-sided hydroureteronephrosis, also without cause identified.  Mild.   Original Report Authenticated By: Consuello Bossier, M.D.      No diagnosis found.    MDM   Pt with hx of multiple bowel obstructions in the past requiring 5 separate surgeries due to adhesions with the last surgery being 2 years ago presents do to worsening vomiting and abdominal pain. Patient states she chronically will get intermittent bouts of nausea but usually the vomiting is not this extensive or severe.  Last bowel movement was 4 days ago. Concern for new bowel obstruction. Patient is hemodynamically stable but does appear dehydrated. She denies any hematemesis or history of gastric ulcers. Patient is not currently taking any NSAIDs and labs are unremarkable except for a mild leukocytosis of 11,000. KUB shows featureless loops of gas-filled bowel in the midabdomen and a mechanical bowel obstruction cannot be excluded. CT of the abdomen and pelvis was ordered. Patient was given IV fluids, pain and nausea control.  8:48 PM Pt with high-grade partial SBO.  Will discuss with surgery.  Pt with signs of UTI and given rocephin.  Surgery will follow and have seen the pt.     Gwyneth Sprout, MD 12/11/11 2049  Gwyneth Sprout, MD 12/11/11 2109

## 2011-12-12 ENCOUNTER — Inpatient Hospital Stay (HOSPITAL_COMMUNITY): Payer: Medicare Other

## 2011-12-12 DIAGNOSIS — K56609 Unspecified intestinal obstruction, unspecified as to partial versus complete obstruction: Secondary | ICD-10-CM

## 2011-12-12 DIAGNOSIS — K660 Peritoneal adhesions (postprocedural) (postinfection): Secondary | ICD-10-CM

## 2011-12-12 DIAGNOSIS — I4891 Unspecified atrial fibrillation: Secondary | ICD-10-CM

## 2011-12-12 DIAGNOSIS — R109 Unspecified abdominal pain: Secondary | ICD-10-CM

## 2011-12-12 DIAGNOSIS — N390 Urinary tract infection, site not specified: Secondary | ICD-10-CM

## 2011-12-12 LAB — GLUCOSE, CAPILLARY: Glucose-Capillary: 76 mg/dL (ref 70–99)

## 2011-12-12 LAB — CALCIUM, IONIZED: Calcium, Ion: 0.77 mmol/L — ABNORMAL LOW (ref 1.12–1.32)

## 2011-12-12 MED ORDER — LIDOCAINE HCL 1 % IJ SOLN
INTRAMUSCULAR | Status: AC
  Filled 2011-12-12: qty 20

## 2011-12-12 MED ORDER — FENTANYL CITRATE 0.05 MG/ML IJ SOLN
50.0000 ug | Freq: Once | INTRAMUSCULAR | Status: AC
  Administered 2011-12-12: 50 ug via INTRAVENOUS
  Filled 2011-12-12: qty 2

## 2011-12-12 MED ORDER — CHLORHEXIDINE GLUCONATE 0.12 % MT SOLN
15.0000 mL | Freq: Two times a day (BID) | OROMUCOSAL | Status: DC
  Administered 2011-12-12 – 2011-12-16 (×8): 15 mL via OROMUCOSAL
  Filled 2011-12-12 (×11): qty 15

## 2011-12-12 MED ORDER — ZINC TRACE METAL 1 MG/ML IV SOLN
INTRAVENOUS | Status: AC
  Administered 2011-12-12: 22:00:00 via INTRAVENOUS
  Filled 2011-12-12: qty 1000

## 2011-12-12 MED ORDER — FAT EMULSION 20 % IV EMUL
250.0000 mL | INTRAVENOUS | Status: AC
  Administered 2011-12-12: 250 mL via INTRAVENOUS
  Filled 2011-12-12: qty 250

## 2011-12-12 MED ORDER — CHLORHEXIDINE GLUCONATE 0.12 % MT SOLN: 15.0000 mL | Freq: Two times a day (BID) | OROMUCOSAL | Status: DC

## 2011-12-12 MED ORDER — IOHEXOL 300 MG/ML  SOLN: 10.0000 mL | Freq: Once | INTRAMUSCULAR | Status: AC | PRN

## 2011-12-12 MED ORDER — SODIUM CHLORIDE 0.9 % IV SOLN: INTRAVENOUS | Status: AC

## 2011-12-12 MED ORDER — ZOLPIDEM TARTRATE 5 MG PO TABS: 5.0000 mg | ORAL_TABLET | Freq: Once | ORAL | Status: DC

## 2011-12-12 MED ORDER — ZOLPIDEM TARTRATE 5 MG PO TABS
5.0000 mg | ORAL_TABLET | Freq: Once | ORAL | Status: AC
  Administered 2011-12-13: 5 mg via ORAL
  Filled 2011-12-12: qty 1

## 2011-12-12 NOTE — Progress Notes (Signed)
PARENTERAL NUTRITION CONSULT NOTE - INITIAL  Pharmacy Consult for TPN Indication: Bowel obstruction  Allergies  Allergen Reactions  . Dabigatran Etexilate Mesylate Other (See Comments)    INTERNAL BLEEDING  . Talwin (Pentazocine) Other (See Comments)    hallucinations  . Pentazocine Lactate Other (See Comments)    HALLUCINATIONS  . Sulfonamide Derivatives Other (See Comments)    JITTERINESS     Patient Measurements: Height: 5\' 2"  (157.5 cm) Weight: 94 lb 5.7 oz (42.8 kg) IBW/kg (Calculated) : 50.1   Vital Signs: Temp: 98.1 F (36.7 C) (09/25 1439) Temp src: Oral (09/25 1439) BP: 106/56 mmHg (09/25 1439) Pulse Rate: 86  (09/25 1439) Intake/Output from previous day: 09/24 0701 - 09/25 0700 In: 632 [P.O.:45; I.V.:587] Out: 1280 [Emesis/NG output:1280]  Labs:  [facility] 12/11/11 1455  WBC 11.3*  HGB 13.3  HCT 40.6  PLT 365  APTT --  INR --     Basename 12/11/11 1455 12/11/11 1445  NA 138 --  K 4.6 --  CL 97 --  CO2 27 --  GLUCOSE 100* --  BUN 35* --  CREATININE 0.82 --  LABCREA -- --  CREAT24HRUR -- --  CALCIUM 6.2* --  MG -- 1.9  PHOS -- --  PROT 5.9* --  ALBUMIN 2.4* --  AST 16 --  ALT 13 --  ALKPHOS 85 --  BILITOT 0.3 --  BILIDIR -- --  IBILI -- --  PREALBUMIN -- --  TRIG -- --  CHOLHDL -- --  CHOL -- --   Estimated Creatinine Clearance: 45 ml/min (by C-G formula based on Cr of 0.82).   No results found for this basename: GLUCAP:3 in the last 72 hours  Medical History: Past Medical History  Diagnosis Date  . GERD (gastroesophageal reflux disease)   . CAD (coronary artery disease)     mild disease per cath in 2011  . LBBB (left bundle branch block)   . Atrial fibrillation     on amiodarone  . Venous embolism and thrombosis of subclavian vein     after pacemaker insertion Oct 2011  . Pleural effusion      s/p right thoracentesis 03/09  . Small bowel obstruction   . Urinary incontinence      -INTERSTIM IMPLANT NOT FUNCTIONING PER  PT  . RLS (restless legs syndrome)   . Primary dilated cardiomyopathy     EF 45 to 50% per echo in Jan 2012  . Vitamin B12 deficiency   . Gastroparesis   . Chronic nausea   . Chronic abdominal pain   . Chronic pain syndrome   . Pacemaker     CRT therapy; followed by Dr. Graciela Husbands  . H/O: GI bleed     from Pradaxa  . Oxygen dependent     2 liters via nasal cannula at all times  . CHF (congestive heart failure)   . Anemia   . Hx of stress fx aug 2011    right hip   . Elbow fracture, left aug 2012  . Arthritis     right hip  . Hx of gastric ulcer   . Diverticulitis   . HTN (hypertension)     under control; has been on med. x "years"  . COPD (chronic obstructive pulmonary disease)     continuous O2  . Hx MRSA infection   . Pneumonia - 03/09, 12/09, 02/10, 06/10    MOST RECENT FEB 2013  . Elevated liver enzymes   . Blood transfusion   . Anginal  pain   . Shortness of breath   . Fibromyalgia     COUPLE OF TIMES A YEAR-PT HAS EPISODES OF CONFUSION-USUALLY INVOLVES DAY/NIGHT REVERSAL AND EPISODES OF TWITCHING AND FALLS--SEES DR. Modesto Charon - NEUROLOGIST-LAST SEEN 07/10/11    Medications:  Scheduled:    . bisacodyl  10 mg Rectal Daily  . chlorhexidine  15 mL Mouth Rinse BID  . ciprofloxacin  400 mg Intravenous BID  . enoxaparin (LOVENOX) injection  40 mg Subcutaneous QHS  . fentaNYL  50 mcg Transdermal Q72H  .  HYDROmorphone (DILAUDID) injection  1 mg Intravenous Once  . influenza  inactive virus vaccine  0.5 mL Intramuscular Tomorrow-1000  . lidocaine      . lip balm  1 application Topical BID  . metoprolol  2.5 mg Intravenous Q6H  . olopatadine  1 drop Both Eyes BID  . ondansetron (ZOFRAN) IV  4 mg Intravenous Once  . ondansetron  4 mg Intravenous Once  . pantoprazole (PROTONIX) IV  40 mg Intravenous Q12H  . sodium chloride  1,000 mL Intravenous Once  . tiotropium  18 mcg Inhalation Daily  . DISCONTD: azelastine  1 drop Both Eyes BID  . DISCONTD: chlorhexidine  15 mL Mouth  Rinse BID  . DISCONTD: lactated ringers  1,000 mL Intravenous Once   Infusions:    . sodium chloride 1,000 mL (12/12/11 1236)    Nutritional Goals:   RD recs 9/25:  Kcal:1500-1700, Protein:65-75g  Clinimix 5/20 at goal rate of 60 ml/hr + Lipids MWF to provide: 72 g/day protein and 1746 Kcal/day MWF, 1267 Kcal/day STTHS(Avg. 1473 Kcal/day weekly).  Current nutrition:   Diet: NPO  IVF: NS at 75 ml/hr  CBGs & Insulin requirements past 24 hours:   CBGs: none  Insulin: none  Assessment:   66 YOF admit with recurrent high-grade small bowel obstruction (hx of multiple abdominal surgeries).  NG to suction.    Renal/hepatic function: wnl (9/24)  Electrolytes: wnl (9/24)  Pre-Albumin: pending  TG/Cholesterol: pending  GI prophylaxis:  Pantoprazole IV   Plan:  At 1800 tonight Start Clinimix E 5/20 at 40 ml/hr Fat emulsion at 10 ml/hr (MWF only due to ongoing shortage). TNA to contain standard multivitamins and trace elements (MWF only due to ongoing shortage). Reduce IVF to 35 ml/hr. Add CBGs q8h; Add SSI if needed. TNA lab panels on Mondays & Thursdays. F/u daily.   Lynann Beaver PharmD, BCPS Pager [phone] 12/12/2011 4:53 PM

## 2011-12-12 NOTE — Progress Notes (Addendum)
Subjective: Comfortable in bed. No flatus, talking with nurses about pain meds. Nausea is better.  Objective: Vital signs in last 24 hours: Temp:  [97.7 F (36.5 C)-98.4 F (36.9 C)] 97.7 F (36.5 C) (09/25 0554) Pulse Rate:  [88-102] 88  (09/25 0554) Resp:  [14-18] 14  (09/25 0554) BP: (109-122)/(54-93) 122/54 mmHg (09/25 0554) SpO2:  [96 %-100 %] 98 % (09/25 0725) Weight:  [42.8 kg (94 lb 5.7 oz)] 42.8 kg (94 lb 5.7 oz) (09/24 2315) Last BM Date:  (Patient stated "3-4 days ago")  NG 1280 ml recorded, NPO, afebrile, VSS,  Intake/Output from previous day: 09/24 0701 - 09/25 0700 In: 632 [P.O.:45; I.V.:587] Out: 1280 [Emesis/NG output:1280] Intake/Output this shift:    General appearance: alert, cooperative and no distress GI: She is small, but still distended.  BS hyperactive, she has no signs of peritonitis, no rebound or guarding.  Lab Results:   Basename 12/11/11 1455  WBC 11.3*  HGB 13.3  HCT 40.6  PLT 365    BMET  Basename 12/11/11 1455  NA 138  K 4.6  CL 97  CO2 27  GLUCOSE 100*  BUN 35*  CREATININE 0.82  CALCIUM 6.2*   PT/INR No results found for this basename: LABPROT:2,INR:2 in the last 72 hours   Lab 12/11/11 1455  AST 16  ALT 13  ALKPHOS 85  BILITOT 0.3  PROT 5.9*  ALBUMIN 2.4*     Lipase     Component Value Date/Time   LIPASE 8* 12/11/2011 1455     Studies/Results: Dg Abd 1 View  12/11/2011  *RADIOLOGY REPORT*  Clinical Data: 66 year old female abdominal pain nausea vomiting.  ABDOMEN - 1 VIEW  Comparison: 11/03/2010.  Findings: Cardiac pacemaker lead partially visible.  Pelvic stimulator device re-identified.  Interval right hip arthroplasty. Osteopenia.  Somewhat featureless gas filled bowel loops in the mid and lower abdomen.  The more normal appearing gas filled bowel in the pelvis.  IMPRESSION: Featureless loops of gas-filled bowel in the mid abdomen. Mechanical bowel obstruction cannot be excluded. Gas pattern in the pelvis is  more normal.   Original Report Authenticated By: Harley Hallmark, M.D.    Ct Abdomen Pelvis W Contrast  12/11/2011  *RADIOLOGY REPORT*  Clinical Data: History bowel obstruction.  Last bowel movement 4 days ago.  Nausea vomiting since on the evening.  Hypertension and COPD.  CT ABDOMEN AND PELVIS WITH CONTRAST  Technique:  Multidetector CT imaging of the abdomen and pelvis was performed following the standard protocol during bolus administration of intravenous contrast.  Contrast: 80mL OMNIPAQUE IOHEXOL 300 MG/ML  SOLN  Comparison: Plain film earlier today.  08/08/2010 CT.  Findings: Centrilobular emphysema at the lung bases.  Right lung base interstitial thickening with mild bronchiectasis, slightly progressive since 08/08/2010.  Normal heart size with dual lead pacer. No pericardial or pleural effusion.  No focal liver lesion.  Too small to characterize splenic lesion is unchanged.  Gallbladder is normal.  There is moderate intra hepatic biliary ductal dilatation.  The common duct is upper normal for patient age, maximally 7 mm.  No obstructive stone or mass is identified. The intrahepatic ductal dilatation is likely similar when compared to the prior unenhanced study.  The stomach is mildly dilated.  The duodenum is markedly dilated. Normal adrenal glands.  Moderate right renal atrophy with mild right hydronephrosis which is chronic.  No cause is identified. Mild left renal cortical atrophy.  No retroperitoneal or retrocrural adenopathy.  The rectum and distal sigmoid are  stool-filled.  There has been a left hemicolectomy.  The proximal jejunum is moderately dilated and fluid-filled.  The mid small bowel is upper normal in caliber.  A probable transition in the right lower quadrant on images 59 - 48. No pneumatosis or free intraperitoneal air.  Beam hardening artifact degrades evaluation of the pelvis secondary to right hip arthroplasty. No pelvic adenopathy.  Pelvic floor laxity.  Hysterectomy.  Normal urinary  bladder.  No definite adnexal mass or significant free pelvic fluid.  Moderate osteopenia.  IMPRESSION:  1.  High-grade partial small bowel obstruction.  Marked duodenal and moderate proximal small bowel dilatation with probable transition point in the distal small bowel.  Favor secondary to adhesions.  The patient would likely benefit from nasogastric tube placement. 2.  No evidence of ischemia or other acute complication. 3.  Centrilobular emphysema with worsened right base aeration. Cannot exclude superimposed infection or chronic aspiration. 4.  Chronic intrahepatic biliary ductal dilatation, without cause identified. 5.  Chronic right-sided hydroureteronephrosis, also without cause identified.  Mild.   Original Report Authenticated By: Consuello Bossier, M.D.     Medications:    . bisacodyl  10 mg Rectal Daily  . chlorhexidine  15 mL Mouth Rinse BID  . ciprofloxacin  400 mg Intravenous BID  . enoxaparin (LOVENOX) injection  40 mg Subcutaneous QHS  . fentaNYL  50 mcg Transdermal Q72H  .  HYDROmorphone (DILAUDID) injection  1 mg Intravenous Once  . influenza  inactive virus vaccine  0.5 mL Intramuscular Tomorrow-1000  . lip balm  1 application Topical BID  . metoprolol  2.5 mg Intravenous Q6H  . olopatadine  1 drop Both Eyes BID  . ondansetron (ZOFRAN) IV  4 mg Intravenous Once  . ondansetron  4 mg Intravenous Once  . pantoprazole (PROTONIX) IV  40 mg Intravenous Q12H  . sodium chloride  1,000 mL Intravenous Once  . tiotropium  18 mcg Inhalation Daily  . DISCONTD: azelastine  1 drop Both Eyes BID  . DISCONTD: chlorhexidine  15 mL Mouth Rinse BID  . DISCONTD: lactated ringers  1,000 mL Intravenous Once    Assessment/Plan Recurrent high-grade small bowel obstruction Multiple abdominal surgeries: S/p colectomy 2001,  Small bowel resection 2003, Gastrocutaneous fistula closure, exploratory lap, lysis of adhesions, gastrostomy tube 2011, hx of hysterectomy, appendectomy Probable UTI CAD/ AF/  Cardiomyopathy/ CHF/PTVP COPD O2  Dependant,  Chronic painsyndrome/ chronic abdominal pain/fibromyalgia on multiple pain meds at home. Hx of gastroparesis Hx Gi Bleed Restless leg Hypertension   Plan:  Continue NG suction, I will recheck 2 view film and labs in AM. She has one already ordered for this AM not done yet. Film no change, discussed with Dr. Donell Beers, will start TNA, she is going down for PICC today.      LOS: 1 day    Willam Munford 12/12/2011

## 2011-12-12 NOTE — Clinical Documentation Improvement (Signed)
MALNUTRITION DOCUMENTATION CLARIFICATION  THIS DOCUMENT IS NOT A PERMANENT PART OF THE MEDICAL RECORD  TO RESPOND TO THE THIS QUERY, FOLLOW THE INSTRUCTIONS BELOW:  1. If needed, update documentation for the patient's encounter via the notes activity.  2. Access this query again and click edit on the In [employer].  3. After updating, or not, click F2 to complete all highlighted (required) fields concerning your review. Select "additional documentation in the medical record" OR "no additional documentation provided".  4. Click Sign note button.  5. The deficiency will fall out of your In Basket *Please let us know if you are not able to complete this workflow by phone or e-mail (listed below).  Please update your documentation within the medical record to reflect your response to this query.                                                                                        12/12/11   Dear Dr. Laural Benes, [facility],  In a better effort to capture your patient's severity of illness, reflect appropriate length of stay and utilization of resources, a review of the patient medical record has revealed the following indicators.    Based on your clinical judgment, please clarify and document in a progress note and/or discharge summary the clinical condition associated with the following supporting information:  In responding to this query please exercise your independent judgment.  The fact that a query is asked, does not imply that any particular answer is desired or expected.  According to HP pt with moderate protein calorie malnutrition  Nutritional assessment notes pt meets criteria for "severe malnutrition" in setting of muscle wasting, fat loss, and 4% body weight loss in the past month.   Please clarify if "moderate protein calorie malnutrition" can be further specified as "severe" and document in progress note and discharge summary.     Possible Clinical  Conditions?   _______Severe Malnutrition   _______Severe Protein Calorie Malnutrition  _______Other Condition________________ _______Cannot clinically determine     Supporting Information: Risk Factors: SBO, Protein calorie malnutrition, A-flutter, AFIB, Systolic CHF chronic, COPD, GERD, Chronic hypoxemic respiratory failure, UTI, hypocalcemia,   Signs & Symptoms: Nutritional Note:  Pt meets criteria for severe malnutrition of chronic illness AEB pt with severe muscle wasting in upper extremities and clavicles and fat loss throughout body and pt reported 4% weight loss in the past month.    Food/Nutrition Related Hx: Pt reports being unable to eat/drink for the past 2 days r/t nausea/vomiting/abdomnial pain. Pt states before then, for the past 3 weeks, she was eating 2 meals/day and drinking up to 2 [employer] daily. Pt reports 4 pound unintended weight loss in the past month. Pt with NGT in place with 80ml light green/yellow output total yesterday. Pt reports improvement in nausea and abdominal distention. CT of the abdomen/pelvis yesterday showed high grade partial SBO.  Ht: 5\' 2"  (157.5 cm)  Wt: 94 lb 5.7 oz (42.8 kg)  BMI=17.26  DOCUMENTATION CODES  Per approved criteria   -Severe malnutrition in the context of chronic illness  -Underweight     Marshall Cork 12/12/2011, 10:36  AM   Diagnostics:  Component     Latest Ref Rng 12/11/2011  Calcium     8.4 - 10.5 mg/dL 6.2 (LL)  Total Protein     6.0 - 8.3 g/dL 5.9 (L)  Albumin     3.5 - 5.2 g/dL 2.4 (L)   Treatment  Monitoring I & 0   You may use possible, probable, or suspect with inpatient documentation. possible, probable, suspected diagnoses MUST be documented at the time of discharge  Reviewed: additional documentation in the medical record ljh pn 12/13/11  Thank You,  Enis Slipper  RN, BSN, MSN/Inf, CCDS Clinical Documentation Specialist [facility] Dept Pager: [phone] /  E-mail: [email or web address]  Health Information Management [place]

## 2011-12-12 NOTE — Progress Notes (Addendum)
INITIAL ADULT NUTRITION ASSESSMENT Date: 12/12/2011   Time: 10:36 AM Reason for Assessment: Nutrition risk   INTERVENTION: Diet advancement per MD. If diet unable to be advanced in next 1-2 days, recommend nutrition support. Will monitor.   Pt meets criteria for severe malnutrition of chronic illness AEB pt with severe muscle wasting in upper extremities and clavicles and fat loss throughout body and pt reported 4% weight loss in the past month.   ASSESSMENT: Female 66 y.o.  Dx: Protein-calorie malnutrition, moderate  Food/Nutrition Related Hx: Pt reports being unable to eat/drink for the past 2 days r/t nausea/vomiting/abdomnial pain. Pt states before then, for the past 3 weeks, she was eating 2 meals/day and drinking up to 2 [employer] daily. Pt reports 4 pound unintended weight loss in the past month. Pt with NGT in place with 80ml light green/yellow output total yesterday. Pt reports improvement in nausea and abdominal distention. CT of the abdomen/pelvis yesterday showed high grade partial SBO.   Hx:  Past Medical History  Diagnosis Date  . GERD (gastroesophageal reflux disease)   . CAD (coronary artery disease)     mild disease per cath in 2011  . LBBB (left bundle branch block)   . Atrial fibrillation     on amiodarone  . Venous embolism and thrombosis of subclavian vein     after pacemaker insertion Oct 2011  . Pleural effusion      s/p right thoracentesis 03/09  . Small bowel obstruction   . Urinary incontinence      -INTERSTIM IMPLANT NOT FUNCTIONING PER PT  . RLS (restless legs syndrome)   . Primary dilated cardiomyopathy     EF 45 to 50% per echo in Jan 2012  . Vitamin B12 deficiency   . Gastroparesis   . Chronic nausea   . Chronic abdominal pain   . Chronic pain syndrome   . Pacemaker     CRT therapy; followed by Dr. Graciela Husbands  . H/O: GI bleed     from Pradaxa  . Oxygen dependent     2 liters via nasal cannula at all times  . CHF (congestive heart failure)     . Anemia   . Hx of stress fx aug 2011    right hip   . Elbow fracture, left aug 2012  . Arthritis     right hip  . Hx of gastric ulcer   . Diverticulitis   . HTN (hypertension)     under control; has been on med. x "years"  . COPD (chronic obstructive pulmonary disease)     continuous O2  . Hx MRSA infection   . Pneumonia - 03/09, 12/09, 02/10, 06/10    MOST RECENT FEB 2013  . Elevated liver enzymes   . Blood transfusion   . Anginal pain   . Shortness of breath   . Fibromyalgia     COUPLE OF TIMES A YEAR-PT HAS EPISODES OF CONFUSION-USUALLY INVOLVES DAY/NIGHT REVERSAL AND EPISODES OF TWITCHING AND FALLS--SEES DR. Modesto Charon - NEUROLOGIST-LAST SEEN 07/10/11   Related Meds:  Scheduled Meds:   . bisacodyl  10 mg Rectal Daily  . chlorhexidine  15 mL Mouth Rinse BID  . ciprofloxacin  400 mg Intravenous BID  . enoxaparin (LOVENOX) injection  40 mg Subcutaneous QHS  . fentaNYL  50 mcg Transdermal Q72H  .  HYDROmorphone (DILAUDID) injection  1 mg Intravenous Once  . influenza  inactive virus vaccine  0.5 mL Intramuscular Tomorrow-1000  . lip balm  1 application  Topical BID  . metoprolol  2.5 mg Intravenous Q6H  . olopatadine  1 drop Both Eyes BID  . ondansetron (ZOFRAN) IV  4 mg Intravenous Once  . ondansetron  4 mg Intravenous Once  . pantoprazole (PROTONIX) IV  40 mg Intravenous Q12H  . sodium chloride  1,000 mL Intravenous Once  . tiotropium  18 mcg Inhalation Daily  . DISCONTD: azelastine  1 drop Both Eyes BID  . DISCONTD: chlorhexidine  15 mL Mouth Rinse BID  . DISCONTD: lactated ringers  1,000 mL Intravenous Once   Continuous Infusions:   . sodium chloride 75 mL/hr at 12/11/11 2342   PRN Meds:.acetaminophen, albuterol, diphenhydrAMINE, fentaNYL, iohexol, magic mouthwash, menthol-cetylpyridinium, nitroGLYCERIN, ondansetron (ZOFRAN) IV, ondansetron, phenol, promethazine, DISCONTD: alum & mag hydroxide-simeth, DISCONTD: fentaNYL, DISCONTD: ondansetron (ZOFRAN) IV  Ht: 5\' 2"   (157.5 cm)  Wt: 94 lb 5.7 oz (42.8 kg)  Ideal Wt: 110 lb % Ideal Wt: 85  Usual Wt: 98 lb in June 2013 % Usual Wt: 96   Body mass index is 17.26 kg/(m^2). Underweight   Labs:  CMP     Component Value Date/Time   NA 138 12/11/2011 1455   K 4.6 12/11/2011 1455   CL 97 12/11/2011 1455   CO2 27 12/11/2011 1455   GLUCOSE 100* 12/11/2011 1455   GLUCOSE 104* 03/11/2006 1059   BUN 35* 12/11/2011 1455   CREATININE 0.82 12/11/2011 1455   CALCIUM 6.2* 12/11/2011 1455   PROT 5.9* 12/11/2011 1455   ALBUMIN 2.4* 12/11/2011 1455   AST 16 12/11/2011 1455   ALT 13 12/11/2011 1455   ALKPHOS 85 12/11/2011 1455   BILITOT 0.3 12/11/2011 1455   GFRNONAA 72* 12/11/2011 1455   GFRAA 84* 12/11/2011 1455    Intake/Output Summary (Last 24 hours) at 12/12/11 1048 Last data filed at 12/12/11 0900  Gross per 24 hour  Intake    632 ml  Output   1280 ml  Net   -648 ml   Last BM - 3-4 days ago per pt report   Diet Order: NPO   IVF:    sodium chloride Last Rate: 75 mL/hr at 12/11/11 2342    Estimated Nutritional Needs:   Kcal:1500-1700 Protein:65-75g Fluid:1.5-1.7L  NUTRITION DIAGNOSIS: -Inadequate oral intake (NI-2.1).  Status: Ongoing  RELATED TO: partial SBO  AS EVIDENCE BY: MD notes  MONITORING/EVALUATION(Goals): If diet unable to be advanced in next 1-2 days, recommend nutrition support.   EDUCATION NEEDS: -No education needs identified at this time  Dietitian #: [phone]  DOCUMENTATION CODES Per approved criteria  -Severe malnutrition in the context of chronic illness -Underweight    Marshall Cork 12/12/2011, 10:36 AM

## 2011-12-12 NOTE — Progress Notes (Signed)
[facility] Progress Note  12/12/2011 HPI Pleasant female with numerous prior abdominal surgeries Including hysterectomy, appendectomy, and partial colectomy. History of small bowel obstructions in the past. Had to undergo extensive lyses of adhesions for over four hours and 2012. He denied could not be completed secondary to dense adhesions and poor tissue quality. Eventually improved. She has chronic intermittent distention and nausea. Chronic pain. Patient notes worsening abdominal pain and distention over past 4 days PTA pain tended to be in the right lower side. She began to have emesis. Still a little flatus. Last bowel movement a few days ago. Based on concerns, she came emergency room. Workup and CT scan very suspicious for bowel obstruction. Urinalysis pushes for her urinary tract infection as well. We are asked to be involved. Has numerous health issues including his numerous pulmonary and cardiac issues. On chronic antiarrhythmic medications.  Subjective: Pt without complaints.  She is resting comfortably.  Nausea controlled with meds. No BM reported.   Objective:  Vital signs in last 24 hours: Filed Vitals:   12/11/11 2315 12/12/11 0032 12/12/11 0554 12/12/11 0725  BP: 119/63 109/59 122/54   Pulse: 95 94 88   Temp: 98 F (36.7 C)  97.7 F (36.5 C)   TempSrc: Oral  Oral   Resp: 16  14   Height: 5\' 2"  (1.575 m)     Weight: 42.8 kg (94 lb 5.7 oz)     SpO2: 97%  100% 98%   Weight change:   Intake/Output Summary (Last 24 hours) at 12/12/11 1101 Last data filed at 12/12/11 0900  Gross per 24 hour  Intake    632 ml  Output   1280 ml  Net   -648 ml   Lab Results  Component Value Date   HGBA1C  Value: 5.6 (NOTE)   The ADA recommends the following therapeutic goals for glycemic   control related to Hgb A1C measurement:   Goal of Therapy:   < 7.0% Hgb A1C   Action Suggested:  > 8.0% Hgb A1C   Ref:  Diabetes Care, 22, Suppl. 1, 1999 08/17/2007   Lab Results  Component  Value Date   LDLCALC 100* 12/09/2007   CREATININE 0.82 12/11/2011    Review of Systems As above, otherwise all reviewed and reported negative  Physical Exam General - awake, cachectic, no distress, cooperative HEENT - NGT in place, NCAT, MMM Lungs - BBS, CTA CV - normal s1, s2 sounds Abd - soft, mildly distended Ext - no C/C/E  Lab Results: Results for orders placed during the hospital encounter of 12/11/11 (from the past 24 hour(s))  MAGNESIUM     Status: Normal   Collection Time   12/11/11  2:45 PM      Component Value Range   Magnesium 1.9  1.5 - 2.5 mg/dL  CALCIUM, IONIZED     Status: Abnormal   Collection Time   12/11/11  2:45 PM      Component Value Range   Calcium, Ion 0.77 (*) 1.12 - 1.32 mmol/L  CBC WITH DIFFERENTIAL     Status: Abnormal   Collection Time   12/11/11  2:55 PM      Component Value Range   WBC 11.3 (*) 4.0 - 10.5 K/uL   RBC 4.46  3.87 - 5.11 MIL/uL   Hemoglobin 13.3  12.0 - 15.0 g/dL   HCT 16.1  09.6 - 04.5 %   MCV 91.0  78.0 - 100.0 fL   MCH 29.8  26.0 - 34.0  pg   MCHC 32.8  30.0 - 36.0 g/dL   RDW 14.7  82.9 - 56.2 %   Platelets 365  150 - 400 K/uL   Neutrophils Relative 81 (*) 43 - 77 %   Neutro Abs 9.1 (*) 1.7 - 7.7 K/uL   Lymphocytes Relative 7 (*) 12 - 46 %   Lymphs Abs 0.8  0.7 - 4.0 K/uL   Monocytes Relative 12  3 - 12 %   Monocytes Absolute 1.4 (*) 0.1 - 1.0 K/uL   Eosinophils Relative 0  0 - 5 %   Eosinophils Absolute 0.0  0.0 - 0.7 K/uL   Basophils Relative 0  0 - 1 %   Basophils Absolute 0.0  0.0 - 0.1 K/uL  COMPREHENSIVE METABOLIC PANEL     Status: Abnormal   Collection Time   12/11/11  2:55 PM      Component Value Range   Sodium 138  135 - 145 mEq/L   Potassium 4.6  3.5 - 5.1 mEq/L   Chloride 97  96 - 112 mEq/L   CO2 27  19 - 32 mEq/L   Glucose, Bld 100 (*) 70 - 99 mg/dL   BUN 35 (*) 6 - 23 mg/dL   Creatinine, Ser 1.30  0.50 - 1.10 mg/dL   Calcium 6.2 (*) 8.4 - 10.5 mg/dL   Total Protein 5.9 (*) 6.0 - 8.3 g/dL   Albumin 2.4  (*) 3.5 - 5.2 g/dL   AST 16  0 - 37 U/L   ALT 13  0 - 35 U/L   Alkaline Phosphatase 85  39 - 117 U/L   Total Bilirubin 0.3  0.3 - 1.2 mg/dL   GFR calc non Af Amer 72 (*) >90 mL/min   GFR calc Af Amer 84 (*) >90 mL/min  LIPASE, BLOOD     Status: Abnormal   Collection Time   12/11/11  2:55 PM      Component Value Range   Lipase 8 (*) 11 - 59 U/L  URINALYSIS, ROUTINE W REFLEX MICROSCOPIC     Status: Abnormal   Collection Time   12/11/11  6:18 PM      Component Value Range   Color, Urine AMBER (*) YELLOW   APPearance CLOUDY (*) CLEAR   Specific Gravity, Urine 1.025  1.005 - 1.030   pH 5.5  5.0 - 8.0   Glucose, UA NEGATIVE  NEGATIVE mg/dL   Hgb urine dipstick NEGATIVE  NEGATIVE   Bilirubin Urine LARGE (*) NEGATIVE   Ketones, ur TRACE (*) NEGATIVE mg/dL   Protein, ur NEGATIVE  NEGATIVE mg/dL   Urobilinogen, UA 0.2  0.0 - 1.0 mg/dL   Nitrite NEGATIVE  NEGATIVE   Leukocytes, UA MODERATE (*) NEGATIVE  URINE MICROSCOPIC-ADD ON     Status: Abnormal   Collection Time   12/11/11  6:18 PM      Component Value Range   Squamous Epithelial / LPF FEW (*) RARE   WBC, UA 11-20  <3 WBC/hpf   Bacteria, UA MANY (*) RARE   Urine-Other MUCOUS PRESENT      Micro Results: No results found for this or any previous visit (from the past 240 hour(s)).  Medications:  Scheduled Meds:   . bisacodyl  10 mg Rectal Daily  . chlorhexidine  15 mL Mouth Rinse BID  . ciprofloxacin  400 mg Intravenous BID  . enoxaparin (LOVENOX) injection  40 mg Subcutaneous QHS  . fentaNYL  50 mcg Transdermal Q72H  .  HYDROmorphone (DILAUDID)  injection  1 mg Intravenous Once  . influenza  inactive virus vaccine  0.5 mL Intramuscular Tomorrow-1000  . lip balm  1 application Topical BID  . metoprolol  2.5 mg Intravenous Q6H  . olopatadine  1 drop Both Eyes BID  . ondansetron (ZOFRAN) IV  4 mg Intravenous Once  . ondansetron  4 mg Intravenous Once  . pantoprazole (PROTONIX) IV  40 mg Intravenous Q12H  . sodium chloride   1,000 mL Intravenous Once  . tiotropium  18 mcg Inhalation Daily  . DISCONTD: azelastine  1 drop Both Eyes BID  . DISCONTD: chlorhexidine  15 mL Mouth Rinse BID  . DISCONTD: lactated ringers  1,000 mL Intravenous Once   Continuous Infusions:   . sodium chloride 75 mL/hr at 12/11/11 2342   PRN Meds:.acetaminophen, albuterol, diphenhydrAMINE, fentaNYL, iohexol, magic mouthwash, menthol-cetylpyridinium, nitroGLYCERIN, ondansetron (ZOFRAN) IV, ondansetron, phenol, promethazine, DISCONTD: alum & mag hydroxide-simeth, DISCONTD: fentaNYL, DISCONTD: ondansetron (ZOFRAN) IV  Assessment/Plan: Probable UTI - continue IV ciprofloxacin  Recurrent High grade SBO - continue NG suction, NPO, follow films, surgery consult, IVFs;   CAD/ AF/ Cardiomyopathy/ CHF/PTVP   COPD O2 Dependant, - continue oxygen support   Chronic pain syndrome/ chronic abdominal pain/fibromyalgia on multiple pain meds at home.   Chronic Constipation - laxatives as needed  Hx of gastroparesis   Hx Gi Bleed   Restless leg   Hypertension - stable, controlled   LOS: 1 day   [name] 12/12/2011, 11:02 AM  Cleora Fleet, MD, CDE, FAAFP [facility] [place], [place]  [phone]

## 2011-12-12 NOTE — Progress Notes (Signed)
NPO/NGT Frozen abdomen. Consider early start of TNA.

## 2011-12-12 NOTE — Procedures (Signed)
RIJV PICC SVC RA 14 cm 

## 2011-12-12 NOTE — Progress Notes (Signed)
Initial review is complete. 

## 2011-12-12 NOTE — Progress Notes (Signed)
NGT NPO Add TNA Discussed with Dr. Dwain Sarna.   Not an operative candidate.

## 2011-12-13 ENCOUNTER — Inpatient Hospital Stay (HOSPITAL_COMMUNITY): Payer: Medicare Other

## 2011-12-13 DIAGNOSIS — D649 Anemia, unspecified: Secondary | ICD-10-CM

## 2011-12-13 DIAGNOSIS — D509 Iron deficiency anemia, unspecified: Secondary | ICD-10-CM

## 2011-12-13 DIAGNOSIS — R1032 Left lower quadrant pain: Secondary | ICD-10-CM

## 2011-12-13 DIAGNOSIS — E876 Hypokalemia: Secondary | ICD-10-CM | POA: Diagnosis not present

## 2011-12-13 DIAGNOSIS — E559 Vitamin D deficiency, unspecified: Secondary | ICD-10-CM | POA: Diagnosis present

## 2011-12-13 LAB — GLUCOSE, CAPILLARY
Glucose-Capillary: 121 mg/dL — ABNORMAL HIGH (ref 70–99)
Glucose-Capillary: 136 mg/dL — ABNORMAL HIGH (ref 70–99)
Glucose-Capillary: 157 mg/dL — ABNORMAL HIGH (ref 70–99)

## 2011-12-13 LAB — DIFFERENTIAL
Basophils Absolute: 0 10*3/uL (ref 0.0–0.1)
Basophils Relative: 0 % (ref 0–1)
Eosinophils Absolute: 0 10*3/uL (ref 0.0–0.7)
Eosinophils Relative: 1 % (ref 0–5)
Lymphocytes Relative: 9 % — ABNORMAL LOW (ref 12–46)
Lymphs Abs: 0.5 10*3/uL — ABNORMAL LOW (ref 0.7–4.0)
Monocytes Absolute: 0.7 10*3/uL (ref 0.1–1.0)
Monocytes Relative: 12 % (ref 3–12)
Neutro Abs: 4.5 10*3/uL (ref 1.7–7.7)
Neutrophils Relative %: 78 % — ABNORMAL HIGH (ref 43–77)

## 2011-12-13 LAB — COMPREHENSIVE METABOLIC PANEL
ALT: 7 U/L (ref 0–35)
AST: 12 U/L (ref 0–37)
Albumin: 1.8 g/dL — ABNORMAL LOW (ref 3.5–5.2)
Alkaline Phosphatase: 63 U/L (ref 39–117)
BUN: 19 mg/dL (ref 6–23)
CO2: 25 mEq/L (ref 19–32)
Calcium: 4.7 mg/dL — CL (ref 8.4–10.5)
Chloride: 102 mEq/L (ref 96–112)
Creatinine, Ser: 0.49 mg/dL — ABNORMAL LOW (ref 0.50–1.10)
GFR calc Af Amer: >90 mL/min (ref >90)
GFR calc non Af Amer: >90 mL/min (ref >90)
Glucose, Bld: 122 mg/dL — ABNORMAL HIGH (ref 70–99)
Potassium: 3.3 mEq/L — ABNORMAL LOW (ref 3.5–5.1)
Sodium: 133 mEq/L — ABNORMAL LOW (ref 135–145)
Total Bilirubin: 0.1 mg/dL — ABNORMAL LOW (ref 0.3–1.2)
Total Protein: 4.3 g/dL — ABNORMAL LOW (ref 6.0–8.3)

## 2011-12-13 LAB — CHOLESTEROL, TOTAL: Cholesterol: 83 mg/dL (ref 0–200)

## 2011-12-13 LAB — MAGNESIUM: Magnesium: 1.7 mg/dL (ref 1.5–2.5)

## 2011-12-13 LAB — PARATHYROID HORMONE, INTACT (NO CA): PTH: 250.4 pg/mL — ABNORMAL HIGH (ref 14.0–72.0)

## 2011-12-13 LAB — PREALBUMIN: Prealbumin: 8.3 mg/dL — ABNORMAL LOW (ref 17.0–34.0)

## 2011-12-13 LAB — CBC
HCT: 30.6 % — ABNORMAL LOW (ref 36.0–46.0)
Hemoglobin: 9.9 g/dL — ABNORMAL LOW (ref 12.0–15.0)
MCH: 29.6 pg (ref 26.0–34.0)
MCHC: 32.4 g/dL (ref 30.0–36.0)
MCV: 91.6 fL (ref 78.0–100.0)
Platelets: 251 10*3/uL (ref 150–400)
RBC: 3.34 MIL/uL — ABNORMAL LOW (ref 3.87–5.11)
RDW: 13.6 % (ref 11.5–15.5)
WBC: 5.7 10*3/uL (ref 4.0–10.5)

## 2011-12-13 LAB — PHOSPHORUS: Phosphorus: 1.8 mg/dL — ABNORMAL LOW (ref 2.3–4.6)

## 2011-12-13 LAB — TRIGLYCERIDES: Triglycerides: 117 mg/dL (ref <150)

## 2011-12-13 LAB — VITAMIN D 25 HYDROXY (VIT D DEFICIENCY, FRACTURES): Vit D, 25-Hydroxy: 15 ng/mL — ABNORMAL LOW (ref 30–89)

## 2011-12-13 MED ORDER — SODIUM CHLORIDE 0.9 % IV SOLN
1.0000 g | Freq: Once | INTRAVENOUS | Status: AC
  Administered 2011-12-13: 1 g via INTRAVENOUS
  Filled 2011-12-13: qty 10

## 2011-12-13 MED ORDER — DEXTROSE 5 % IV SOLN
20.0000 mmol | Freq: Once | INTRAVENOUS | Status: AC
  Administered 2011-12-13: 20 mmol via INTRAVENOUS
  Filled 2011-12-13: qty 6.67

## 2011-12-13 MED ORDER — ZOLPIDEM TARTRATE 5 MG PO TABS
5.0000 mg | ORAL_TABLET | Freq: Once | ORAL | Status: AC
  Administered 2011-12-13: 5 mg via ORAL
  Filled 2011-12-13: qty 1

## 2011-12-13 MED ORDER — CLINIMIX E/DEXTROSE (5/20) 5 % IV SOLN
INTRAVENOUS | Status: AC
  Administered 2011-12-13: 17:00:00 via INTRAVENOUS
  Filled 2011-12-13: qty 1000

## 2011-12-13 MED ORDER — POTASSIUM CHLORIDE 10 MEQ/100ML IV SOLN
10.0000 meq | INTRAVENOUS | Status: AC
  Administered 2011-12-13 (×3): 10 meq via INTRAVENOUS
  Filled 2011-12-13 (×3): qty 100

## 2011-12-13 MED ORDER — FENTANYL CITRATE 0.05 MG/ML IJ SOLN
25.0000 ug | INTRAMUSCULAR | Status: DC | PRN
  Administered 2011-12-13 – 2011-12-18 (×25): 25 ug via INTRAVENOUS
  Filled 2011-12-13 (×26): qty 2

## 2011-12-13 NOTE — Progress Notes (Signed)
[facility] Progress Note  12/13/2011  HPI  Pleasant female with numerous prior abdominal surgeries Including hysterectomy, appendectomy, and partial colectomy. History of small bowel obstructions in the past. Had to undergo extensive lyses of adhesions for over four hours and 2012. He denied could not be completed secondary to dense adhesions and poor tissue quality. Eventually improved. She has chronic intermittent distention and nausea. Chronic pain. Patient notes worsening abdominal pain and distention over past 4 days PTA pain tended to be in the right lower side. She began to have emesis. Still a little flatus. Last bowel movement a few days ago. Based on concerns, she came emergency room. Workup and CT scan very suspicious for bowel obstruction. Urinalysis pushes for her urinary tract infection as well. We are asked to be involved. Has numerous health issues including his numerous pulmonary and cardiac issues. On chronic antiarrhythmic medications.  Subjective: Pt reports that she had a loose BM yesterday.  Pt says that pain is not controlled fully with meds.  No N/V   Objective:  Vital signs in last 24 hours: Filed Vitals:   12/12/11 1439 12/12/11 1927 12/12/11 2130 12/13/11 0603  BP: 106/56 133/61 114/55 122/94  Pulse: 86 92 86 90  Temp: 98.1 F (36.7 C) 98.1 F (36.7 C) 98.1 F (36.7 C) 97.9 F (36.6 C)  TempSrc: Oral Oral Oral Oral  Resp: 16 16 17 18   Height:      Weight:      SpO2: 99% 100% 100% 100%   Weight change:   Intake/Output Summary (Last 24 hours) at 12/13/11 0935 Last data filed at 12/13/11 0604  Gross per 24 hour  Intake    706 ml  Output    761 ml  Net    -55 ml   Lab Results  Component Value Date   HGBA1C  Value: 5.6 (NOTE)   The ADA recommends the following therapeutic goals for glycemic   control related to Hgb A1C measurement:   Goal of Therapy:   < 7.0% Hgb A1C   Action Suggested:  > 8.0% Hgb A1C   Ref:  Diabetes Care, 22, Suppl. 1, 1999  08/17/2007   Lab Results  Component Value Date   LDLCALC 100* 12/09/2007   CREATININE 0.49* 12/13/2011    Review of Systems As above, otherwise all reviewed and reported negative  Physical Exam General - awake, cachectic, no distress, cooperative  HEENT - NGT in place, NCAT, MMM Lungs - BBS, CTA  CV - normal s1, s2 sounds  Abd - soft, mildly distended, LUQ TTP, no guarding Ext - no C/C/E  Lab Results: Results for orders placed during the hospital encounter of 12/11/11 (from the past 24 hour(s))  VITAMIN D 25 HYDROXY     Status: Abnormal   Collection Time   12/12/11  6:45 PM      Component Value Range   Vit D, 25-Hydroxy 15 (*) 30 - 89 ng/mL  GLUCOSE, CAPILLARY     Status: Normal   Collection Time   12/12/11 10:11 PM      Component Value Range   Glucose-Capillary 76  70 - 99 mg/dL  CBC     Status: Abnormal   Collection Time   12/13/11  6:45 AM      Component Value Range   WBC 5.7  4.0 - 10.5 K/uL   RBC 3.34 (*) 3.87 - 5.11 MIL/uL   Hemoglobin 9.9 (*) 12.0 - 15.0 g/dL   HCT 08.6 (*) 57.8 - 46.9 %  MCV 91.6  78.0 - 100.0 fL   MCH 29.6  26.0 - 34.0 pg   MCHC 32.4  30.0 - 36.0 g/dL   RDW 40.9  81.1 - 91.4 %   Platelets 251  150 - 400 K/uL  COMPREHENSIVE METABOLIC PANEL     Status: Abnormal   Collection Time   12/13/11  6:45 AM      Component Value Range   Sodium 133 (*) 135 - 145 mEq/L   Potassium 3.3 (*) 3.5 - 5.1 mEq/L   Chloride 102  96 - 112 mEq/L   CO2 25  19 - 32 mEq/L   Glucose, Bld 122 (*) 70 - 99 mg/dL   BUN 19  6 - 23 mg/dL   Creatinine, Ser 7.82 (*) 0.50 - 1.10 mg/dL   Calcium 4.7 (*) 8.4 - 10.5 mg/dL   Total Protein 4.3 (*) 6.0 - 8.3 g/dL   Albumin 1.8 (*) 3.5 - 5.2 g/dL   AST 12  0 - 37 U/L   ALT 7  0 - 35 U/L   Alkaline Phosphatase 63  39 - 117 U/L   Total Bilirubin 0.1 (*) 0.3 - 1.2 mg/dL   GFR calc non Af Amer >90  >90 mL/min   GFR calc Af Amer >90  >90 mL/min  MAGNESIUM     Status: Normal   Collection Time   12/13/11  6:45 AM      Component  Value Range   Magnesium 1.7  1.5 - 2.5 mg/dL  PHOSPHORUS     Status: Abnormal   Collection Time   12/13/11  6:45 AM      Component Value Range   Phosphorus 1.8 (*) 2.3 - 4.6 mg/dL  DIFFERENTIAL     Status: Abnormal   Collection Time   12/13/11  6:45 AM      Component Value Range   Neutrophils Relative 78 (*) 43 - 77 %   Neutro Abs 4.5  1.7 - 7.7 K/uL   Lymphocytes Relative 9 (*) 12 - 46 %   Lymphs Abs 0.5 (*) 0.7 - 4.0 K/uL   Monocytes Relative 12  3 - 12 %   Monocytes Absolute 0.7  0.1 - 1.0 K/uL   Eosinophils Relative 1  0 - 5 %   Eosinophils Absolute 0.0  0.0 - 0.7 K/uL   Basophils Relative 0  0 - 1 %   Basophils Absolute 0.0  0.0 - 0.1 K/uL  GLUCOSE, CAPILLARY     Status: Abnormal   Collection Time   12/13/11  6:52 AM      Component Value Range   Glucose-Capillary 121 (*) 70 - 99 mg/dL    Micro Results: No results found for this or any previous visit (from the past 240 hour(s)).  Medications:  Scheduled Meds:   . bisacodyl  10 mg Rectal Daily  . chlorhexidine  15 mL Mouth Rinse BID  . ciprofloxacin  400 mg Intravenous BID  . enoxaparin (LOVENOX) injection  40 mg Subcutaneous QHS  . fentaNYL  50 mcg Transdermal Q72H  . fentaNYL  50 mcg Intravenous Once  . influenza  inactive virus vaccine  0.5 mL Intramuscular Tomorrow-1000  . lidocaine      . lip balm  1 application Topical BID  . metoprolol  2.5 mg Intravenous Q6H  . olopatadine  1 drop Both Eyes BID  . pantoprazole (PROTONIX) IV  40 mg Intravenous Q12H  . tiotropium  18 mcg Inhalation Daily  . zolpidem  5  mg Oral Once  . DISCONTD: zolpidem  5 mg Oral Once   Continuous Infusions:   . sodium chloride 1,000 mL (12/12/11 1236)  . sodium chloride 1,000 mL (12/12/11 1800)  . TPN (CLINIMIX) +/- additives 40 mL/hr at 12/12/11 2143   And  . fat emulsion 250 mL (12/12/11 2143)   PRN Meds:.acetaminophen, albuterol, diphenhydrAMINE, fentaNYL, iohexol, magic mouthwash, menthol-cetylpyridinium, nitroGLYCERIN,  ondansetron (ZOFRAN) IV, ondansetron, phenol, promethazine  Assessment/Plan: UTI - continue IV ciprofloxacin   Recurrent High grade SBO - continue NG suction, NPO, TNA started, follow films, surgery consult, IVFs;   Severe Protein Calorie Malnutrition - Pt started on TNA and tolerating it currently well  Hypocalcemia/low Vit D  - Pt is NPO, will give IV calcium today, follow Ca, start Vit D and oral calcium when can take p.o.   CAD/ AF/ Cardiomyopathy/ CHF/PTVP   COPD O2 Dependant, - continue oxygen support   Chronic pain syndrome/ chronic abdominal pain/fibromyalgia on multiple pain meds at home.   Chronic Constipation - laxatives as needed, pt starting to have BMs now  Hx of gastroparesis   Hx Gi Bleed   Restless leg   Hypertension - stable, controlled   LOS: 2 days   Greogory Cornette 12/13/2011, 9:35 AM  Cleora Fleet, MD, CDE, FAAFP [facility] [place], [place]  [phone]

## 2011-12-13 NOTE — Progress Notes (Signed)
PARENTERAL NUTRITION CONSULT NOTE - FOLLOW UP  Pharmacy Consult for TNA Indication: Bowel obstruction  Allergies  Allergen Reactions  . Dabigatran Etexilate Mesylate Other (See Comments)    INTERNAL BLEEDING  . Talwin (Pentazocine) Other (See Comments)    hallucinations  . Pentazocine Lactate Other (See Comments)    HALLUCINATIONS  . Sulfonamide Derivatives Other (See Comments)    JITTERINESS    Patient Measurements: Height: 5\' 2"  (157.5 cm) Weight: 94 lb 5.7 oz (42.8 kg) IBW/kg (Calculated) : 50.1  Usual Weight: 98lb  Vital Signs: Temp: 97.9 F (36.6 C) (09/26 0603) Temp src: Oral (09/26 0603) BP: 122/94 mmHg (09/26 0603) Pulse Rate: 90  (09/26 0603) Intake/Output from previous day: 09/25 0701 - 09/26 0700 In: 3006.1 [P.O.:40; I.V.:1769.6; NG/GT:105; IV Piggyback:615; TPN:474.4] Out: 1061 [Urine:650; Emesis/NG output:410; Stool:1] Intake/Output from this shift:    Labs:  Basename 12/13/11 0645 12/11/11 1455  WBC 5.7 11.3*  HGB 9.9* 13.3  HCT 30.6* 40.6  PLT 251 365  APTT -- --  INR -- --     Basename 12/13/11 0645 12/13/11 0500 12/11/11 1455 12/11/11 1445  NA 133* -- 138 --  K 3.3* -- 4.6 --  CL 102 -- 97 --  CO2 25 -- 27 --  GLUCOSE 122* -- 100* --  BUN 19 -- 35* --  CREATININE 0.49* -- 0.82 --  LABCREA -- -- -- --  CREAT24HRUR -- -- -- --  CALCIUM 4.7* -- 6.2* --  MG 1.7 -- -- 1.9  PHOS 1.8* -- -- --  PROT 4.3* -- 5.9* --  ALBUMIN 1.8* -- 2.4* --  AST 12 -- 16 --  ALT 7 -- 13 --  ALKPHOS 63 -- 85 --  BILITOT 0.1* -- 0.3 --  BILIDIR -- -- -- --  IBILI -- -- -- --  PREALBUMIN -- -- -- --  TRIG -- 117 -- --  CHOLHDL -- -- -- --  CHOL -- 83 -- --   Estimated Creatinine Clearance: 46.1 ml/min (by C-G formula based on Cr of 0.49).    Basename 12/13/11 0652 12/12/11 2211  GLUCAP 121* 76   Medications:  Infusions:    . sodium chloride 1,000 mL (12/12/11 1236)  . sodium chloride 1,000 mL (12/12/11 1800)  . TPN (CLINIMIX) +/- additives 40  mL/hr at 12/12/11 2143   And  . fat emulsion 250 mL (12/12/11 2143)    Insulin Requirements in the past 24 hours:  CBG: 76/121 No insulin ordered, CBG q8h, add insulin as necessary.  Nutritional Goals: RD recommendations from 9/25 are 1500-1700 kCal, 65-75 grams of protein per day. -Goal rate TNA E 5/20 at 60 ml/hr = 72 gm protein, avg 1473 kcal/day. (kcal MWF = 1746; Tu,Th,Sa,Su = 1022)  Current Nutrition:  Clinimix E 5/20 at 40 ml/hr Diet: NPO, NG to suction. IV: NS at 35 ml/hr.  Assessment: 65 YOF admit with recurrent high-grade small bowel obstruction (hx of multiple abdominal surgeries).  Electrolytes:Na, K, Phosphorus low; K runs x 3 and K Phos 20 mmol bolus today. Unable to adjust lytes in TNA. Calcium decreased further today; corrected Ca = 6.3, for 1gm Ca Gluconate IV.   Renal/Hepatic function: wnl  Pre-albumin: pending  TG/Cholesterol:117/83  GI prophylaxis: IV Protonix.  Plan: at 1800 today:  Continue Clinimix E 5/20 at 67ml/hr, advance to goal rate when lytes stabilized.  Fat emulsion, MVI and trace elements MWF only due to ongoing shortage.  No change NS at 35 ml/hr  TNA labs Mon,Thur  BMET, Magnesium,  Phosphorous levels in am.   Otho Bellows PharmD Pager [phone] 12/13/2011,10:52 AM

## 2011-12-13 NOTE — Progress Notes (Signed)
Nutrition Brief Note  Intervention: TPN per pharmacy. Hopefully as partial SBO improves and resolves, diet can be advanced and TPN can be weaned. Will monitor.   Diet: NPO  TPN: Clinimix E 5/20 @ 40 ml/hr.  Lipids (20% IVFE @ 10 ml/hr), multivitamins, and trace elements are provided 3 times weekly (MWF) due to national backorder.  Provides 1051 kcal and 48 grams protein daily (based on weekly average).  Meets 70% minimum estimated kcal and 73% minimum estimated protein needs.  Additional IVF with NS @ 35 ml/hr.  - Pt started on TPN yesterday. Pt had 2 loose BMs this morning. Pt with improved abdominal distention and minimal NGT output yesterday ( ). MD reports pt with improving partial SBO. CBGs controlled. Noted pt with low potassium and phosphorus which are being replaced.    Potassium  Date/Time Value Range Status  12/13/2011  6:45 AM 3.3* 3.5 - 5.1 mEq/L Final     DELTA CHECK NOTED     REPEATED TO VERIFY  12/11/2011  2:55 PM 4.6  3.5 - 5.1 mEq/L Final  08/04/2011  3:25 AM 3.9  3.5 - 5.1 mEq/L Final    Phosphorus  Date/Time Value Range Status  12/13/2011  6:45 AM 1.8* 2.3 - 4.6 mg/dL Final  03/24/1094  0:45 AM 3.3  2.3 - 4.6 mg/dL Final  4/0/9811  9:14 AM 3.5  2.3 - 4.6 mg/dL Final    Magnesium  Date/Time Value Range Status  12/13/2011  6:45 AM 1.7  1.5 - 2.5 mg/dL Final  7/82/9562  1:30 PM 1.9  1.5 - 2.5 mg/dL Final  10/23/5782  6:96 AM 2.0  1.5 - 2.5 mg/dL Final   CBG (last 3)   Basename 12/13/11 0652 12/12/11 2211  GLUCAP 121* [address] [place], [place], [place] [phone] Pager [phone] After Hours Pager

## 2011-12-13 NOTE — Progress Notes (Signed)
Patient reports that fentanyl not controlling pain. "It takes it down some but not much" per patient.  Patient has fibromyalgia and has been on fentanyl for 6-7 years now. Paged NP on call. Orders received for increase dose of fentanyl. Patient also requesting sleeping aid. Takes Restoril at home. Order for [employer] obtained.

## 2011-12-13 NOTE — Progress Notes (Signed)
Patient with loose bm this am!!

## 2011-12-13 NOTE — Progress Notes (Signed)
Subjective: Still complaining of pain,but having more than one BM.  Now on TNA.  Objective: Vital signs in last 24 hours: Temp:  [97.9 F (36.6 C)-98.1 F (36.7 C)] 97.9 F (36.6 C) (09/26 0603) Pulse Rate:  [78-92] 90  (09/26 0603) Resp:  [16-18] 18  (09/26 0603) BP: (104-133)/(55-94) 122/94 mmHg (09/26 0603) SpO2:  [99 %-100 %] 100 % (09/26 0603) Last BM Date: 12/13/11  Had a bowel movement this AM, NPO, VSS, K+ is low, H/H is down also  Intake/Output from previous day: 09/25 0701 - 09/26 0700 In: 3006.1 [P.O.:40; I.V.:1769.6; NG/GT:105; IV Piggyback:615; TPN:474.4] Out: 1061 [Urine:650; Emesis/NG output:410; Stool:1] Intake/Output this shift:    General appearance: alert, cooperative and no distress GI: soft, distension is much improved, + BS, minimal thru NG yesterday.  Lab Results:   Basename 12/13/11 0645 12/11/11 1455  WBC 5.7 11.3*  HGB 9.9* 13.3  HCT 30.6* 40.6  PLT 251 365    BMET  Basename 12/13/11 0645 12/11/11 1455  NA 133* 138  K 3.3* 4.6  CL 102 97  CO2 25 27  GLUCOSE 122* 100*  BUN 19 35*  CREATININE 0.49* 0.82  CALCIUM 4.7* 6.2*   PT/INR No results found for this basename: LABPROT:2,INR:2 in the last 72 hours   Lab 12/13/11 0645 12/11/11 1455  AST 12 16  ALT 7 13  ALKPHOS 63 85  BILITOT 0.1* 0.3  PROT 4.3* 5.9*  ALBUMIN 1.8* 2.4*     Lipase     Component Value Date/Time   LIPASE 8* 12/11/2011 1455     Studies/Results: Abd 1 View (kub)  12/12/2011  *RADIOLOGY REPORT*  Clinical Data: Follow up small bowel obstruction  ABDOMEN - 1 VIEW  Comparison: 12/11/2011  Findings: There is a nerve stimulator battery pack within the projection of the left lower quadrant.  The leads terminate in the region of the right sacrum.  The patient has a right hip arthroplasty device.  The contrast material is identified within the dilated right renal collecting system and normal appearing left renal collecting system.  There is opacification of the  urinary bladder.  The bowel gas pattern appears unchanged from previous exam.  Dilated small bowel loops within the left lower quadrant of the persist.  IMPRESSION:  1.  No significant change in bowel gas pattern. 2.  Extruded contrast material within the renal collecting systems and urinary bladder.   Original Report Authenticated By: Rosealee Albee, M.D.    Dg Abd 1 View  12/11/2011  *RADIOLOGY REPORT*  Clinical Data: 66 year old female abdominal pain nausea vomiting.  ABDOMEN - 1 VIEW  Comparison: 11/03/2010.  Findings: Cardiac pacemaker lead partially visible.  Pelvic stimulator device re-identified.  Interval right hip arthroplasty. Osteopenia.  Somewhat featureless gas filled bowel loops in the mid and lower abdomen.  The more normal appearing gas filled bowel in the pelvis.  IMPRESSION: Featureless loops of gas-filled bowel in the mid abdomen. Mechanical bowel obstruction cannot be excluded. Gas pattern in the pelvis is more normal.   Original Report Authenticated By: Harley Hallmark, M.D.    Ct Abdomen Pelvis W Contrast  12/11/2011  *RADIOLOGY REPORT*  Clinical Data: History bowel obstruction.  Last bowel movement 4 days ago.  Nausea vomiting since on the evening.  Hypertension and COPD.  CT ABDOMEN AND PELVIS WITH CONTRAST  Technique:  Multidetector CT imaging of the abdomen and pelvis was performed following the standard protocol during bolus administration of intravenous contrast.  Contrast: 80mL OMNIPAQUE IOHEXOL  300 MG/ML  SOLN  Comparison: Plain film earlier today.  08/08/2010 CT.  Findings: Centrilobular emphysema at the lung bases.  Right lung base interstitial thickening with mild bronchiectasis, slightly progressive since 08/08/2010.  Normal heart size with dual lead pacer. No pericardial or pleural effusion.  No focal liver lesion.  Too small to characterize splenic lesion is unchanged.  Gallbladder is normal.  There is moderate intra hepatic biliary ductal dilatation.  The common duct is  upper normal for patient age, maximally 7 mm.  No obstructive stone or mass is identified. The intrahepatic ductal dilatation is likely similar when compared to the prior unenhanced study.  The stomach is mildly dilated.  The duodenum is markedly dilated. Normal adrenal glands.  Moderate right renal atrophy with mild right hydronephrosis which is chronic.  No cause is identified. Mild left renal cortical atrophy.  No retroperitoneal or retrocrural adenopathy.  The rectum and distal sigmoid are stool-filled.  There has been a left hemicolectomy.  The proximal jejunum is moderately dilated and fluid-filled.  The mid small bowel is upper normal in caliber.  A probable transition in the right lower quadrant on images 59 - 48. No pneumatosis or free intraperitoneal air.  Beam hardening artifact degrades evaluation of the pelvis secondary to right hip arthroplasty. No pelvic adenopathy.  Pelvic floor laxity.  Hysterectomy.  Normal urinary bladder.  No definite adnexal mass or significant free pelvic fluid.  Moderate osteopenia.  IMPRESSION:  1.  High-grade partial small bowel obstruction.  Marked duodenal and moderate proximal small bowel dilatation with probable transition point in the distal small bowel.  Favor secondary to adhesions.  The patient would likely benefit from nasogastric tube placement. 2.  No evidence of ischemia or other acute complication. 3.  Centrilobular emphysema with worsened right base aeration. Cannot exclude superimposed infection or chronic aspiration. 4.  Chronic intrahepatic biliary ductal dilatation, without cause identified. 5.  Chronic right-sided hydroureteronephrosis, also without cause identified.  Mild.   Original Report Authenticated By: Consuello Bossier, M.D.    Ir Veno/ext/uni Right  12/13/2011  *RADIOLOGY REPORT*  Clinical Data: Crohn's disease.  Poor IV access.  PICC LINE PLACEMENT WITH ULTRASOUND AND FLUOROSCOPIC  GUIDANCE  Fluoroscopy Time: 11.7 minutes.  Initially, the right  arm was prepped and draped in a sterile fashion and 1% lidocaine was utilized for local infiltration. Under sonographic guidance, a micropuncture needle was inserted into the right basilic vein and removed over a 0018 wire.  A 5- French peel-away sheath was inserted, through which 5-French Kumpe catheter was advanced.  Venography confirms chronic occlusion of the brachial vein.  The central axillary vein reconstitutes.  An attempt was made to access the central venous structures from the right arm unsuccessfully.  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  The patient therefore has a chronic bilateral upper extremity venous occlusion.  The right neck was prepped with chlorhexidine, draped in the usual sterile fashion using maximum barrier technique (cap and mask, sterile gown, sterile gloves, large sterile sheet, hand hygiene and cutaneous antisepsis) and infiltrated locally with 1% Lidocaine.  Ultrasound demonstrated patency of the right internal jugular vein, and this was documented with an image.  Under real-time ultrasound guidance, this vein was accessed with a 21 gauge micropuncture needle and image documentation was performed.  The needle was exchanged over a guidewire for a peel-away sheath through which a five [place] double lumen PICC trimmed to 14 cm was advanced, positioned with  its tip at the lower SVC/right atrial junction. Fluoroscopy during the procedure and fluoro spot radiograph confirms appropriate catheter position.  The catheter was flushed, secured to the skin with Prolene sutures, and covered with a sterile dressing.  Complications:  None  IMPRESSION: Successful right neck internal jugular vein PICC line placement with ultrasound and fluoroscopic guidance.  The catheter is ready for use.  The patient has chronic bilateral upper extremity venous occlusion.   Original Report Authenticated By: Donavan Burnet, M.D.    Ir Fluoro Guide  Cv Line Right  12/13/2011  *RADIOLOGY REPORT*  Clinical Data: Crohn's disease.  Poor IV access.  PICC LINE PLACEMENT WITH ULTRASOUND AND FLUOROSCOPIC  GUIDANCE  Fluoroscopy Time: 11.7 minutes.  Initially, the right arm was prepped and draped in a sterile fashion and 1% lidocaine was utilized for local infiltration. Under sonographic guidance, a micropuncture needle was inserted into the right basilic vein and removed over a 0018 wire.  A 5- French peel-away sheath was inserted, through which 5-French Kumpe catheter was advanced.  Venography confirms chronic occlusion of the brachial vein.  The central axillary vein reconstitutes.  An attempt was made to access the central venous structures from the right arm unsuccessfully.  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  The patient therefore has a chronic bilateral upper extremity venous occlusion.  The right neck was prepped with chlorhexidine, draped in the usual sterile fashion using maximum barrier technique (cap and mask, sterile gown, sterile gloves, large sterile sheet, hand hygiene and cutaneous antisepsis) and infiltrated locally with 1% Lidocaine.  Ultrasound demonstrated patency of the right internal jugular vein, and this was documented with an image.  Under real-time ultrasound guidance, this vein was accessed with a 21 gauge micropuncture needle and image documentation was performed.  The needle was exchanged over a guidewire for a peel-away sheath through which a five [place] double lumen PICC trimmed to 14 cm was advanced, positioned with its tip at the lower SVC/right atrial junction. Fluoroscopy during the procedure and fluoro spot radiograph confirms appropriate catheter position.  The catheter was flushed, secured to the skin with Prolene sutures, and covered with a sterile dressing.  Complications:  None  IMPRESSION: Successful right neck internal jugular vein PICC line placement with  ultrasound and fluoroscopic guidance.  The catheter is ready for use.  The patient has chronic bilateral upper extremity venous occlusion.   Original Report Authenticated By: Donavan Burnet, M.D.    Ir US Guide Vasc Access Right  12/13/2011  *RADIOLOGY REPORT*  Clinical Data: Crohn's disease.  Poor IV access.  PICC LINE PLACEMENT WITH ULTRASOUND AND FLUOROSCOPIC  GUIDANCE  Fluoroscopy Time: 11.7 minutes.  Initially, the right arm was prepped and draped in a sterile fashion and 1% lidocaine was utilized for local infiltration. Under sonographic guidance, a micropuncture needle was inserted into the right basilic vein and removed over a 0018 wire.  A 5- French peel-away sheath was inserted, through which 5-French Kumpe catheter was advanced.  Venography confirms chronic occlusion of the brachial vein.  The central axillary vein reconstitutes.  An attempt was made to access the central venous structures from the right arm unsuccessfully.  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  The patient therefore has a chronic bilateral upper extremity venous occlusion.  The right neck was prepped with chlorhexidine, draped in the usual sterile fashion using maximum barrier technique (cap and mask, sterile  gown, sterile gloves, large sterile sheet, hand hygiene and cutaneous antisepsis) and infiltrated locally with 1% Lidocaine.  Ultrasound demonstrated patency of the right internal jugular vein, and this was documented with an image.  Under real-time ultrasound guidance, this vein was accessed with a 21 gauge micropuncture needle and image documentation was performed.  The needle was exchanged over a guidewire for a peel-away sheath through which a five [place] double lumen PICC trimmed to 14 cm was advanced, positioned with its tip at the lower SVC/right atrial junction. Fluoroscopy during the procedure and fluoro spot radiograph confirms appropriate catheter  position.  The catheter was flushed, secured to the skin with Prolene sutures, and covered with a sterile dressing.  Complications:  None  IMPRESSION: Successful right neck internal jugular vein PICC line placement with ultrasound and fluoroscopic guidance.  The catheter is ready for use.  The patient has chronic bilateral upper extremity venous occlusion.   Original Report Authenticated By: Donavan Burnet, M.D.    Dg Abd 2 Views  12/13/2011  *RADIOLOGY REPORT*  Clinical Data: Abdominal pain.  Follow up of small bowel obstruction.  ABDOMEN - 2 VIEW  Comparison: 12/12/2011 and CT of the 12/11/2011  Findings: Right side up and supine views.  The supine view demonstrates slight improvement in small bowel distention compared to the scout film of 12/11/2011.  Left upper quadrant small bowel loop measures 2.8 cm, only mildly dilated.  Right-sided decubitus view demonstrates numerous small bowel air fluid levels. No free intraperitoneal air.  Spinal stimulator.  Contrast within urinary bladder.  Right hip arthroplasty.  IMPRESSION: Slight improvement in partial small bowel obstruction.   Original Report Authenticated By: Consuello Bossier, M.D.     Medications:    . bisacodyl  10 mg Rectal Daily  . calcium gluconate  1 g Intravenous Once  . chlorhexidine  15 mL Mouth Rinse BID  . ciprofloxacin  400 mg Intravenous BID  . enoxaparin (LOVENOX) injection  40 mg Subcutaneous QHS  . fentaNYL  50 mcg Transdermal Q72H  . fentaNYL  50 mcg Intravenous Once  . lidocaine      . lip balm  1 application Topical BID  . metoprolol  2.5 mg Intravenous Q6H  . olopatadine  1 drop Both Eyes BID  . pantoprazole (PROTONIX) IV  40 mg Intravenous Q12H  . potassium chloride  10 mEq Intravenous Q1 Hr x 3  . potassium phosphate IVPB (mmol)  20 mmol Intravenous Once  . tiotropium  18 mcg Inhalation Daily  . zolpidem  5 mg Oral Once  . DISCONTD: zolpidem  5 mg Oral Once    Assessment/Plan Recurrent high-grade small bowel  obstruction  Multiple abdominal surgeries: S/p colectomy 2001, Small bowel resection 2003, Gastrocutaneous fistula closure, exploratory lap, lysis of adhesions, gastrostomy tube 2011, hx of hysterectomy, appendectomy  Probable UTI  CAD/ AF/ Cardiomyopathy/ CHF/PTVP  COPD O2 Dependant,  Chronic painsyndrome/ chronic abdominal pain/fibromyalgia on multiple pain meds at home.  Hx of gastroparesis  Hx Gi Bleed  Restless leg  Hypertension    Plan:  i have ask for a stool guaiac, drop in H/H may just be dilutional. It would appear she is opening up.  I will leave NG as is for now. Film this AM reports improving PSBO. K+ being replaced   LOS: 2 days    [name] 12/13/2011

## 2011-12-13 NOTE — Progress Notes (Signed)
?   Resolving SBO.  Continue NGT TNA.

## 2011-12-14 ENCOUNTER — Inpatient Hospital Stay (HOSPITAL_COMMUNITY): Payer: Medicare Other

## 2011-12-14 DIAGNOSIS — G894 Chronic pain syndrome: Secondary | ICD-10-CM

## 2011-12-14 DIAGNOSIS — E860 Dehydration: Secondary | ICD-10-CM

## 2011-12-14 DIAGNOSIS — K219 Gastro-esophageal reflux disease without esophagitis: Secondary | ICD-10-CM

## 2011-12-14 DIAGNOSIS — I5022 Chronic systolic (congestive) heart failure: Secondary | ICD-10-CM

## 2011-12-14 LAB — COMPREHENSIVE METABOLIC PANEL
ALT: 7 U/L (ref 0–35)
AST: 12 U/L (ref 0–37)
Albumin: 1.9 g/dL — ABNORMAL LOW (ref 3.5–5.2)
Alkaline Phosphatase: 70 U/L (ref 39–117)
BUN: 8 mg/dL (ref 6–23)
CO2: 25 mEq/L (ref 19–32)
Calcium: 5.4 mg/dL — CL (ref 8.4–10.5)
Chloride: 99 mEq/L (ref 96–112)
Creatinine, Ser: 0.38 mg/dL — ABNORMAL LOW (ref 0.50–1.10)
GFR calc Af Amer: >90 mL/min (ref >90)
GFR calc non Af Amer: >90 mL/min (ref >90)
Glucose, Bld: 142 mg/dL — ABNORMAL HIGH (ref 70–99)
Potassium: 3.9 mEq/L (ref 3.5–5.1)
Sodium: 132 mEq/L — ABNORMAL LOW (ref 135–145)
Total Bilirubin: 0.2 mg/dL — ABNORMAL LOW (ref 0.3–1.2)
Total Protein: 4.9 g/dL — ABNORMAL LOW (ref 6.0–8.3)

## 2011-12-14 LAB — CALCIUM, IONIZED: Calcium, Ion: 0.78 mmol/L — ABNORMAL LOW (ref 1.12–1.32)

## 2011-12-14 LAB — MAGNESIUM: Magnesium: 1.7 mg/dL (ref 1.5–2.5)

## 2011-12-14 LAB — PHOSPHORUS: Phosphorus: 1.7 mg/dL — ABNORMAL LOW (ref 2.3–4.6)

## 2011-12-14 LAB — GLUCOSE, CAPILLARY
Glucose-Capillary: 154 mg/dL — ABNORMAL HIGH (ref 70–99)
Glucose-Capillary: 143 mg/dL — ABNORMAL HIGH (ref 70–99)

## 2011-12-14 MED ORDER — ZOLPIDEM TARTRATE 5 MG PO TABS
5.0000 mg | ORAL_TABLET | Freq: Once | ORAL | Status: AC
  Administered 2011-12-14: 5 mg via ORAL
  Filled 2011-12-14: qty 1

## 2011-12-14 MED ORDER — ENOXAPARIN SODIUM 30 MG/0.3ML ~~LOC~~ SOLN
30.0000 mg | SUBCUTANEOUS | Status: DC
  Administered 2011-12-14 – 2011-12-17 (×4): 30 mg via SUBCUTANEOUS
  Filled 2011-12-14 (×5): qty 0.3

## 2011-12-14 MED ORDER — PSEUDOEPHEDRINE HCL 30 MG PO TABS
30.0000 mg | ORAL_TABLET | Freq: Four times a day (QID) | ORAL | Status: DC | PRN
  Administered 2011-12-14 – 2011-12-15 (×2): 30 mg via ORAL
  Filled 2011-12-14 (×3): qty 1

## 2011-12-14 MED ORDER — INSULIN ASPART 100 UNIT/ML ~~LOC~~ SOLN
0.0000 [IU] | Freq: Three times a day (TID) | SUBCUTANEOUS | Status: DC
  Administered 2011-12-14 – 2011-12-16 (×5): 1 [IU] via SUBCUTANEOUS
  Administered 2011-12-17: 2 [IU] via SUBCUTANEOUS

## 2011-12-14 MED ORDER — FAT EMULSION 20 % IV EMUL
250.0000 mL | INTRAVENOUS | Status: AC
  Administered 2011-12-14: 250 mL via INTRAVENOUS
  Filled 2011-12-14: qty 250

## 2011-12-14 MED ORDER — SODIUM CHLORIDE 0.9 % IV SOLN
1.0000 g | Freq: Once | INTRAVENOUS | Status: AC
  Administered 2011-12-14: 1 g via INTRAVENOUS
  Filled 2011-12-14: qty 10

## 2011-12-14 MED ORDER — ZINC TRACE METAL 1 MG/ML IV SOLN
INTRAVENOUS | Status: AC
  Administered 2011-12-14: 18:00:00 via INTRAVENOUS
  Filled 2011-12-14: qty 2000

## 2011-12-14 MED ORDER — DEXTROSE 5 % IV SOLN
20.0000 mmol | Freq: Once | INTRAVENOUS | Status: AC
  Administered 2011-12-14: 20 mmol via INTRAVENOUS
  Filled 2011-12-14: qty 6.67

## 2011-12-14 NOTE — Progress Notes (Signed)
Seems like opening up.  Would be slow to advance and pull NGT given history. abd soft, minimally tender.

## 2011-12-14 NOTE — Progress Notes (Signed)
Subjective: Say she had diarrhea all night up to 6 times.  Feels better complaining of runny nose.  Objective: Vital signs in last 24 hours: Temp:  [97.8 F (36.6 C)-98 F (36.7 C)] 97.8 F (36.6 C) (09/27 0510) Pulse Rate:  [85-88] 88  (09/27 0510) Resp:  [16-18] 16  (09/27 0510) BP: (120-144)/(65-74) 136/67 mmHg (09/27 0510) SpO2:  [100 %] 100 % (09/27 0510) Last BM Date: 12/13/11 50/NG,  2 BM recorded, afebrile, VSS, labs OK Intake/Output from previous day: 09/26 0701 - 09/27 0700 In: 0  Out: 2100 [Urine:2050; Emesis/NG output:50] Intake/Output this shift:    General appearance: alert, cooperative and no distress GI: soft, non-tender; bowel sounds normal; no masses,  no organomegaly and distension she had earlier this week is better.  Lab Results:   Basename 12/13/11 0645 12/11/11 1455  WBC 5.7 11.3*  HGB 9.9* 13.3  HCT 30.6* 40.6  PLT 251 365    BMET  Basename 12/14/11 0640 12/13/11 0645  NA 132* 133*  K 3.9 3.3*  CL 99 102  CO2 25 25  GLUCOSE 142* 122*  BUN 8 19  CREATININE 0.38* 0.49*  CALCIUM 5.4* 4.7*   PT/INR No results found for this basename: LABPROT:2,INR:2 in the last 72 hours   Lab 12/14/11 0640 12/13/11 0645 12/11/11 1455  AST 12 12 16   ALT 7 7 13   ALKPHOS 70 63 85  BILITOT 0.2* 0.1* 0.3  PROT 4.9* 4.3* 5.9*  ALBUMIN 1.9* 1.8* 2.4*     Lipase     Component Value Date/Time   LIPASE 8* 12/11/2011 1455     Studies/Results: Ir Veno/ext/uni Right  12/13/2011  *RADIOLOGY REPORT*  Clinical Data: Crohn's disease.  Poor IV access.  PICC LINE PLACEMENT WITH ULTRASOUND AND FLUOROSCOPIC  GUIDANCE  Fluoroscopy Time: 11.7 minutes.  Initially, the right arm was prepped and draped in a sterile fashion and 1% lidocaine was utilized for local infiltration. Under sonographic guidance, a micropuncture needle was inserted into the right basilic vein and removed over a 0018 wire.  A 5- French peel-away sheath was inserted, through which 5-French Kumpe  catheter was advanced.  Venography confirms chronic occlusion of the brachial vein.  The central axillary vein reconstitutes.  An attempt was made to access the central venous structures from the right arm unsuccessfully.  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  The patient therefore has a chronic bilateral upper extremity venous occlusion.  The right neck was prepped with chlorhexidine, draped in the usual sterile fashion using maximum barrier technique (cap and mask, sterile gown, sterile gloves, large sterile sheet, hand hygiene and cutaneous antisepsis) and infiltrated locally with 1% Lidocaine.  Ultrasound demonstrated patency of the right internal jugular vein, and this was documented with an image.  Under real-time ultrasound guidance, this vein was accessed with a 21 gauge micropuncture needle and image documentation was performed.  The needle was exchanged over a guidewire for a peel-away sheath through which a five [place] double lumen PICC trimmed to 14 cm was advanced, positioned with its tip at the lower SVC/right atrial junction. Fluoroscopy during the procedure and fluoro spot radiograph confirms appropriate catheter position.  The catheter was flushed, secured to the skin with Prolene sutures, and covered with a sterile dressing.  Complications:  None  IMPRESSION: Successful right neck internal jugular vein PICC line placement with ultrasound and fluoroscopic guidance.  The catheter is ready for use.  The patient has chronic bilateral upper  extremity venous occlusion.   Original Report Authenticated By: Donavan Burnet, M.D.    Ir Fluoro Guide Cv Line Right  12/13/2011  *RADIOLOGY REPORT*  Clinical Data: Crohn's disease.  Poor IV access.  PICC LINE PLACEMENT WITH ULTRASOUND AND FLUOROSCOPIC  GUIDANCE  Fluoroscopy Time: 11.7 minutes.  Initially, the right arm was prepped and draped in a sterile fashion and 1% lidocaine was utilized  for local infiltration. Under sonographic guidance, a micropuncture needle was inserted into the right basilic vein and removed over a 0018 wire.  A 5- French peel-away sheath was inserted, through which 5-French Kumpe catheter was advanced.  Venography confirms chronic occlusion of the brachial vein.  The central axillary vein reconstitutes.  An attempt was made to access the central venous structures from the right arm unsuccessfully.  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  The patient therefore has a chronic bilateral upper extremity venous occlusion.  The right neck was prepped with chlorhexidine, draped in the usual sterile fashion using maximum barrier technique (cap and mask, sterile gown, sterile gloves, large sterile sheet, hand hygiene and cutaneous antisepsis) and infiltrated locally with 1% Lidocaine.  Ultrasound demonstrated patency of the right internal jugular vein, and this was documented with an image.  Under real-time ultrasound guidance, this vein was accessed with a 21 gauge micropuncture needle and image documentation was performed.  The needle was exchanged over a guidewire for a peel-away sheath through which a five [place] double lumen PICC trimmed to 14 cm was advanced, positioned with its tip at the lower SVC/right atrial junction. Fluoroscopy during the procedure and fluoro spot radiograph confirms appropriate catheter position.  The catheter was flushed, secured to the skin with Prolene sutures, and covered with a sterile dressing.  Complications:  None  IMPRESSION: Successful right neck internal jugular vein PICC line placement with ultrasound and fluoroscopic guidance.  The catheter is ready for use.  The patient has chronic bilateral upper extremity venous occlusion.   Original Report Authenticated By: Donavan Burnet, M.D.    Ir US Guide Vasc Access Right  12/13/2011  *RADIOLOGY REPORT*  Clinical Data: Crohn's  disease.  Poor IV access.  PICC LINE PLACEMENT WITH ULTRASOUND AND FLUOROSCOPIC  GUIDANCE  Fluoroscopy Time: 11.7 minutes.  Initially, the right arm was prepped and draped in a sterile fashion and 1% lidocaine was utilized for local infiltration. Under sonographic guidance, a micropuncture needle was inserted into the right basilic vein and removed over a 0018 wire.  A 5- French peel-away sheath was inserted, through which 5-French Kumpe catheter was advanced.  Venography confirms chronic occlusion of the brachial vein.  The central axillary vein reconstitutes.  An attempt was made to access the central venous structures from the right arm unsuccessfully.  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  The patient therefore has a chronic bilateral upper extremity venous occlusion.  The right neck was prepped with chlorhexidine, draped in the usual sterile fashion using maximum barrier technique (cap and mask, sterile gown, sterile gloves, large sterile sheet, hand hygiene and cutaneous antisepsis) and infiltrated locally with 1% Lidocaine.  Ultrasound demonstrated patency of the right internal jugular vein, and this was documented with an image.  Under real-time ultrasound guidance, this vein was accessed with a 21 gauge micropuncture needle and image documentation was performed.  The needle was exchanged over a guidewire for a peel-away sheath through which a five [place] double  lumen PICC trimmed to 14 cm was advanced, positioned with its tip at the lower SVC/right atrial junction. Fluoroscopy during the procedure and fluoro spot radiograph confirms appropriate catheter position.  The catheter was flushed, secured to the skin with Prolene sutures, and covered with a sterile dressing.  Complications:  None  IMPRESSION: Successful right neck internal jugular vein PICC line placement with ultrasound and fluoroscopic guidance.  The catheter is ready for use.   The patient has chronic bilateral upper extremity venous occlusion.   Original Report Authenticated By: Donavan Burnet, M.D.    Dg Abd 2 Views  12/13/2011  *RADIOLOGY REPORT*  Clinical Data: Abdominal pain.  Follow up of small bowel obstruction.  ABDOMEN - 2 VIEW  Comparison: 12/12/2011 and CT of the 12/11/2011  Findings: Right side up and supine views.  The supine view demonstrates slight improvement in small bowel distention compared to the scout film of 12/11/2011.  Left upper quadrant small bowel loop measures 2.8 cm, only mildly dilated.  Right-sided decubitus view demonstrates numerous small bowel air fluid levels. No free intraperitoneal air.  Spinal stimulator.  Contrast within urinary bladder.  Right hip arthroplasty.  IMPRESSION: Slight improvement in partial small bowel obstruction.   Original Report Authenticated By: Consuello Bossier, M.D.     Medications:    . bisacodyl  10 mg Rectal Daily  . calcium gluconate  1 g Intravenous Once  . calcium gluconate  1 g Intravenous Once  . chlorhexidine  15 mL Mouth Rinse BID  . ciprofloxacin  400 mg Intravenous BID  . enoxaparin (LOVENOX) injection  30 mg Subcutaneous Q24H  . fentaNYL  50 mcg Transdermal Q72H  . insulin aspart  0-9 Units Subcutaneous Q8H  . lip balm  1 application Topical BID  . metoprolol  2.5 mg Intravenous Q6H  . olopatadine  1 drop Both Eyes BID  . pantoprazole (PROTONIX) IV  40 mg Intravenous Q12H  . potassium chloride  10 mEq Intravenous Q1 Hr x 3  . potassium phosphate IVPB (mmol)  20 mmol Intravenous Once  . potassium phosphate IVPB (mmol)  20 mmol Intravenous Once  . tiotropium  18 mcg Inhalation Daily  . zolpidem  5 mg Oral Once  . DISCONTD: enoxaparin (LOVENOX) injection  40 mg Subcutaneous QHS    Assessment/Plan Recurrent high-grade small bowel obstruction  Multiple abdominal surgeries: S/p colectomy 2001, Small bowel resection 2003, Gastrocutaneous fistula closure, exploratory lap, lysis of adhesions,  gastrostomy tube 2011, hx of hysterectomy, appendectomy  Probable UTI  CAD/ AF/ Cardiomyopathy/ CHF/PTVP  COPD O2 Dependant,  Chronic painsyndrome/ chronic abdominal pain/fibromyalgia on multiple pain meds at home.  Hx of gastroparesis  Hx Gi Bleed  Restless leg  Hypertension    Plan:  I have ordered a film but on exam and discussion, it sounds like she's doing much better.     LOS: 3 days    [name] 12/14/2011

## 2011-12-14 NOTE — Progress Notes (Signed)
CRITICAL VALUE ALERT  Critical value received:  Calcium 5.4  Date of notification:  12/14/11  Time of notification:  0750  Critical value read back:yes  Nurse who received alert:  Hulda Marin  MD notified (1st page): Dr. Manson Passey  Time of first page:  [phone]  MD notified (2nd page):  Time of second page:  Responding MD:  Dr. Manson Passey  Time MD responded:  [phone]

## 2011-12-14 NOTE — Progress Notes (Signed)
[facility] Progress Note  12/14/2011  HPI  Pleasant female with numerous prior abdominal surgeries Including hysterectomy, appendectomy, and partial colectomy. History of small bowel obstructions in the past. Had to undergo extensive lyses of adhesions for over four hours and 2012. He denied could not be completed secondary to dense adhesions and poor tissue quality. Eventually improved. She has chronic intermittent distention and nausea. Chronic pain. Patient notes worsening abdominal pain and distention over past 4 days PTA pain tended to be in the right lower side. She began to have emesis. Still a little flatus. Last bowel movement a few days ago. Based on concerns, she came emergency room. Workup and CT scan very suspicious for bowel obstruction. Urinalysis pushes for her urinary tract infection as well. We are asked to be involved. Has numerous health issues including his numerous pulmonary and cardiac issues. On chronic antiarrhythmic medications.  Subjective: Pt having bowel movements and says she is feeling better today  Objective:  Vital signs in last 24 hours: Filed Vitals:   12/13/11 1444 12/13/11 2030 12/14/11 0510 12/14/11 1201  BP: 120/65 144/74 136/67 149/78  Pulse: 85 86 88 84  Temp: 97.8 F (36.6 C) 98 F (36.7 C) 97.8 F (36.6 C)   TempSrc: Oral Oral Oral   Resp: 16 18 16    Height:      Weight:      SpO2: 100% 100% 100%    Weight change:   Intake/Output Summary (Last 24 hours) at 12/14/11 1251 Last data filed at 12/14/11 4782  Gross per 24 hour  Intake      0 ml  Output   2100 ml  Net  -2100 ml   Lab Results  Component Value Date   HGBA1C  Value: 5.6 (NOTE)   The ADA recommends the following therapeutic goals for glycemic   control related to Hgb A1C measurement:   Goal of Therapy:   < 7.0% Hgb A1C   Action Suggested:  > 8.0% Hgb A1C   Ref:  Diabetes Care, 22, Suppl. 1, 1999 08/17/2007   Lab Results  Component Value Date   LDLCALC 100* 12/09/2007   CREATININE 0.38* 12/14/2011    Review of Systems As above, otherwise all reviewed and reported negative  Physical Exam General - awake, cachectic, no distress, cooperative  HEENT - NGT in place, NCAT, MMM Lungs - BBS, CTA  CV - normal s1, s2 sounds  Abd - soft, nondistended, mild LUQ TTP, no guarding  Ext - no C/C/E  Lab Results: Results for orders placed during the hospital encounter of 12/11/11 (from the past 24 hour(s))  GLUCOSE, CAPILLARY     Status: Abnormal   Collection Time   12/13/11  4:15 PM      Component Value Range   Glucose-Capillary 157 (*) 70 - 99 mg/dL   Comment 1 Documented in Chart     Comment 2 Notify RN    GLUCOSE, CAPILLARY     Status: Abnormal   Collection Time   12/13/11 11:51 PM      Component Value Range   Glucose-Capillary 136 (*) 70 - 99 mg/dL   Comment 1 Notify RN    COMPREHENSIVE METABOLIC PANEL     Status: Abnormal   Collection Time   12/14/11  6:40 AM      Component Value Range   Sodium 132 (*) 135 - 145 mEq/L   Potassium 3.9  3.5 - 5.1 mEq/L   Chloride 99  96 - 112 mEq/L   CO2  25  19 - 32 mEq/L   Glucose, Bld 142 (*) 70 - 99 mg/dL   BUN 8  6 - 23 mg/dL   Creatinine, Ser 2.13 (*) 0.50 - 1.10 mg/dL   Calcium 5.4 (*) 8.4 - 10.5 mg/dL   Total Protein 4.9 (*) 6.0 - 8.3 g/dL   Albumin 1.9 (*) 3.5 - 5.2 g/dL   AST 12  0 - 37 U/L   ALT 7  0 - 35 U/L   Alkaline Phosphatase 70  39 - 117 U/L   Total Bilirubin 0.2 (*) 0.3 - 1.2 mg/dL   GFR calc non Af Amer >90  >90 mL/min   GFR calc Af Amer >90  >90 mL/min  MAGNESIUM     Status: Normal   Collection Time   12/14/11  6:40 AM      Component Value Range   Magnesium 1.7  1.5 - 2.5 mg/dL  CALCIUM, IONIZED     Status: Abnormal   Collection Time   12/14/11  6:40 AM      Component Value Range   Calcium, Ion 0.78 (*) 1.12 - 1.32 mmol/L  PHOSPHORUS     Status: Abnormal   Collection Time   12/14/11  6:40 AM      Component Value Range   Phosphorus 1.7 (*) 2.3 - 4.6 mg/dL  GLUCOSE, CAPILLARY     Status:  Abnormal   Collection Time   12/14/11  7:55 AM      Component Value Range   Glucose-Capillary 154 (*) 70 - 99 mg/dL   Comment 1 Notify RN      Micro Results: No results found for this or any previous visit (from the past 240 hour(s)).  Medications:  Scheduled Meds:   . bisacodyl  10 mg Rectal Daily  . calcium gluconate  1 g Intravenous Once  . chlorhexidine  15 mL Mouth Rinse BID  . ciprofloxacin  400 mg Intravenous BID  . enoxaparin (LOVENOX) injection  30 mg Subcutaneous Q24H  . fentaNYL  50 mcg Transdermal Q72H  . insulin aspart  0-9 Units Subcutaneous Q8H  . lip balm  1 application Topical BID  . metoprolol  2.5 mg Intravenous Q6H  . olopatadine  1 drop Both Eyes BID  . pantoprazole (PROTONIX) IV  40 mg Intravenous Q12H  . potassium chloride  10 mEq Intravenous Q1 Hr x 3  . potassium phosphate IVPB (mmol)  20 mmol Intravenous Once  . potassium phosphate IVPB (mmol)  20 mmol Intravenous Once  . tiotropium  18 mcg Inhalation Daily  . zolpidem  5 mg Oral Once  . DISCONTD: enoxaparin (LOVENOX) injection  40 mg Subcutaneous QHS   Continuous Infusions:   . sodium chloride 1,000 mL (12/12/11 1800)  . TPN (CLINIMIX) +/- additives 40 mL/hr at 12/12/11 2143   And  . fat emulsion 250 mL (12/12/11 2143)  . fat emulsion    . TPN (CLINIMIX) +/- additives 40 mL/hr at 12/13/11 1718  . TPN (CLINIMIX) +/- additives     PRN Meds:.acetaminophen, albuterol, diphenhydrAMINE, fentaNYL, magic mouthwash, menthol-cetylpyridinium, nitroGLYCERIN, ondansetron (ZOFRAN) IV, ondansetron, phenol, promethazine, pseudoephedrine  Assessment/Plan: UTI - continue IV ciprofloxacin until she can take oral meds  Recurrent High grade SBO - improving, mgmt per [facility], following abd films  Severe Protein Calorie Malnutrition  - Pt started on TNA and tolerating it currently well   Hypocalcemia/low Vit D  - Pt is NPO, given IV calcium today, follow Ca, start Vit D and oral calcium when  can take p.o.   CAD/  AF/ Cardiomyopathy/ CHF/PTVP  - currently compensated, stable  COPD O2 Dependant, - continue oxygen support   Chronic pain syndrome/ chronic abdominal pain/fibromyalgia on multiple pain meds at home.   Chronic Constipation - laxatives as needed, pt starting to have BMs now   Hx of gastroparesis  Hx Gi Bleed  Restless leg  Hypertension - stable, controlled   LOS: 3 days   Juanangel Soderholm 12/14/2011, 12:51 PM  Cleora Fleet, MD, CDE, FAAFP [facility] [place], [place]  [phone]

## 2011-12-14 NOTE — Progress Notes (Signed)
PARENTERAL NUTRITION CONSULT NOTE   Pharmacy Consult for TNA Indication: Bowel obstruction  Allergies  Allergen Reactions  . Dabigatran Etexilate Mesylate Other (See Comments)    INTERNAL BLEEDING  . Talwin (Pentazocine) Other (See Comments)    hallucinations  . Pentazocine Lactate Other (See Comments)    HALLUCINATIONS  . Sulfonamide Derivatives Other (See Comments)    JITTERINESS    Patient Measurements: Height: 5\' 2"  (157.5 cm) Weight: 94 lb 5.7 oz (42.8 kg) IBW/kg (Calculated) : 50.1  Usual Weight: 98lb  Vital Signs: Temp: 97.8 F (36.6 C) (09/27 0510) Temp src: Oral (09/27 0510) BP: 136/67 mmHg (09/27 0510) Pulse Rate: 88  (09/27 0510) Intake/Output from previous day: 09/26 0701 - 09/27 0700 In: 0  Out: 2100 [Urine:2050; Emesis/NG output:50] Intake/Output from this shift:    Labs:  [facility] 12/13/11 0645 12/11/11 1455  WBC 5.7 11.3*  HGB 9.9* 13.3  HCT 30.6* 40.6  PLT 251 365  APTT -- --  INR -- --     Basename 12/14/11 0640 12/13/11 0645 12/13/11 0500 12/11/11 1455 12/11/11 1445  NA 132* 133* -- 138 --  K 3.9 3.3* -- 4.6 --  CL 99 102 -- 97 --  CO2 25 25 -- 27 --  GLUCOSE 142* 122* -- 100* --  BUN 8 19 -- 35* --  CREATININE 0.38* 0.49* -- 0.82 --  LABCREA -- -- -- -- --  CREAT24HRUR -- -- -- -- --  CALCIUM 5.4* 4.7* -- 6.2* --  MG 1.7 1.7 -- -- 1.9  PHOS 1.7* 1.8* -- -- --  PROT 4.9* 4.3* -- 5.9* --  ALBUMIN 1.9* 1.8* -- 2.4* --  AST 12 12 -- 16 --  ALT 7 7 -- 13 --  ALKPHOS 70 63 -- 85 --  BILITOT 0.2* 0.1* -- 0.3 --  BILIDIR -- -- -- -- --  IBILI -- -- -- -- --  PREALBUMIN -- 8.3* -- -- --  TRIG -- -- 117 -- --  CHOLHDL -- -- -- -- --  CHOL -- -- 83 -- --  Corrected Calcium=7.1 Estimated Creatinine Clearance: 46.1 ml/min (by C-G formula based on Cr of 0.38).    Basename 12/14/11 0755 12/13/11 2351 12/13/11 1615  GLUCAP 154* 136* 157*   Medications:  Infusions:     . sodium chloride 1,000 mL (12/12/11 1800)  . TPN (CLINIMIX)  +/- additives 40 mL/hr at 12/12/11 2143   And  . fat emulsion 250 mL (12/12/11 2143)  . TPN (CLINIMIX) +/- additives 40 mL/hr at 12/13/11 1718    CBGs and Insulin Requirements in the past 24 hours:  CBG: 136 - 157 No insulin ordered currently  Nutritional Goals: RD recommendations from 9/25 are 1500-1700 kCal, 65-75 grams of protein per day. -Goal rate TNA Clinimix E 5/20 at 60 ml/hr delivers 72 gm protein, avg 1473 kcal/day. (kcal MWF = 1746; Tu,Th,Sa,Su = 1267)  Current Nutrition:  Clinimix E 5/20 at 40 ml/hr Diet: NPO, NG to suction.  Labs:  Electrolytes: K improved, wnl; Phos remains low; Ca improved but still low; Na slightly low.   Renal/Hepatic function: wnl  Pre-albumin: 8.3 on 9/26  TG/Cholesterol:117 on 9/26   Assessment:   31 YOF admitted with recurrent high-grade small bowel obstruction (hx of multiple abdominal surgeries).  Electrolytes as above.  CBGs trending up.  TNA remains at initial rate of 40 ml/hr.  Plan:  Calcium Gluconate 1 gram IV x 1 ordered by MD  K Phos 20mM IV x 1  Add sliding  scale Novolog q8h, sensitive scale.  Advance Clinimix E 5/20 to 50 ml/hr..  Fat emulsion, MVI and trace elements MWF only due to ongoing shortage.  BMet, Mag, Phos in AM   Rikki Smestad, Ky Barban PharmD, BCPS Pager [phone] 12/14/2011,8:15 AM

## 2011-12-14 NOTE — Progress Notes (Signed)
Pharmacy Brief Note - Lovenox  On Lovenox 40mg  SQ q24h for VTE prophylaxis  Wt 42.8 kg CrCl 46 mL/min  Hgb 9.9, Hct 30.6 (falling) Pltc 251K  Stool guaiac was ordered yesterday.   P:  With weight < 45 kg, will reduce Lovenox to 30 mg SQ q24h for VTE prophylaxis.  Elie Goody, PharmD, BCPS Pager: [phone] 12/14/2011  6:55 AM

## 2011-12-14 NOTE — Progress Notes (Signed)
Dr. Manson Passey, notified re: critical Calcium of 5.4, new order received.

## 2011-12-15 DIAGNOSIS — E559 Vitamin D deficiency, unspecified: Secondary | ICD-10-CM

## 2011-12-15 LAB — BASIC METABOLIC PANEL
BUN: 9 mg/dL (ref 6–23)
CO2: 25 mEq/L (ref 19–32)
Calcium: 6.1 mg/dL — CL (ref 8.4–10.5)
Chloride: 99 mEq/L (ref 96–112)
Creatinine, Ser: 0.39 mg/dL — ABNORMAL LOW (ref 0.50–1.10)
GFR calc Af Amer: >90 mL/min (ref >90)
GFR calc non Af Amer: >90 mL/min (ref >90)
Glucose, Bld: 128 mg/dL — ABNORMAL HIGH (ref 70–99)
Potassium: 4.3 mEq/L (ref 3.5–5.1)
Sodium: 129 mEq/L — ABNORMAL LOW (ref 135–145)

## 2011-12-15 LAB — GLUCOSE, CAPILLARY
Glucose-Capillary: 115 mg/dL — ABNORMAL HIGH (ref 70–99)
Glucose-Capillary: 125 mg/dL — ABNORMAL HIGH (ref 70–99)
Glucose-Capillary: 138 mg/dL — ABNORMAL HIGH (ref 70–99)
Glucose-Capillary: 98 mg/dL (ref 70–99)

## 2011-12-15 LAB — PHOSPHORUS: Phosphorus: 1.9 mg/dL — ABNORMAL LOW (ref 2.3–4.6)

## 2011-12-15 LAB — MAGNESIUM: Magnesium: 1.9 mg/dL (ref 1.5–2.5)

## 2011-12-15 MED ORDER — ZOLPIDEM TARTRATE 5 MG PO TABS
5.0000 mg | ORAL_TABLET | Freq: Every evening | ORAL | Status: DC | PRN
  Administered 2011-12-15 – 2011-12-17 (×3): 5 mg via ORAL
  Filled 2011-12-15 (×3): qty 1

## 2011-12-15 MED ORDER — SODIUM CHLORIDE 0.9 % IJ SOLN
10.0000 mL | Freq: Two times a day (BID) | INTRAMUSCULAR | Status: DC
  Administered 2011-12-15 – 2011-12-18 (×3): 10 mL

## 2011-12-15 MED ORDER — ALTEPLASE 2 MG IJ SOLR
2.0000 mg | Freq: Once | INTRAMUSCULAR | Status: AC
  Administered 2011-12-15: 2 mg
  Filled 2011-12-15: qty 2

## 2011-12-15 MED ORDER — SODIUM CHLORIDE 0.9 % IJ SOLN
10.0000 mL | INTRAMUSCULAR | Status: DC | PRN
Start: 2011-12-15 — End: 2011-12-18
  Administered 2011-12-15: 10 mL

## 2011-12-15 MED ORDER — SODIUM CHLORIDE 0.9 % IV SOLN
1.0000 g | Freq: Once | INTRAVENOUS | Status: AC
  Administered 2011-12-15: 1 g via INTRAVENOUS
  Filled 2011-12-15: qty 10

## 2011-12-15 MED ORDER — SODIUM PHOSPHATE 3 MMOLE/ML IV SOLN
20.0000 mmol | Freq: Once | INTRAVENOUS | Status: AC
  Administered 2011-12-15: 20 mmol via INTRAVENOUS
  Filled 2011-12-15: qty 6.67

## 2011-12-15 MED ORDER — CLINIMIX E/DEXTROSE (5/20) 5 % IV SOLN
INTRAVENOUS | Status: AC
  Administered 2011-12-15: 18:00:00 via INTRAVENOUS
  Filled 2011-12-15: qty 2000

## 2011-12-15 NOTE — Progress Notes (Signed)
[facility] Progress Note  12/15/2011  HPI  Pleasant female with numerous prior abdominal surgeries Including hysterectomy, appendectomy, and partial colectomy. History of small bowel obstructions in the past. Had to undergo extensive lyses of adhesions for over four hours and 2012. He denied could not be completed secondary to dense adhesions and poor tissue quality. Eventually improved. She has chronic intermittent distention and nausea. Chronic pain. Patient notes worsening abdominal pain and distention over past 4 days PTA pain tended to be in the right lower side. She began to have emesis. Still a little flatus. Last bowel movement a few days ago. Based on concerns, she came emergency room. Workup and CT scan very suspicious for bowel obstruction. Urinalysis pushes for her urinary tract infection as well. We are asked to be involved. Has numerous health issues including his numerous pulmonary and cardiac issues. On chronic antiarrhythmic medications.  Subjective: Pt says she feels hungry today.  Still having BMs.  Abd pain persists  Objective:  Vital signs in last 24 hours: Filed Vitals:   12/14/11 1500 12/14/11 2025 12/15/11 0455 12/15/11 0826  BP: 121/68 131/81 124/74   Pulse: 88 98 100   Temp: 98 F (36.7 C) 97.8 F (36.6 C) 97.5 F (36.4 C)   TempSrc: Oral Oral Oral   Resp: 18 16 17    Height:      Weight:      SpO2: 100% 100% 100% 98%   Weight change:   Intake/Output Summary (Last 24 hours) at 12/15/11 1045 Last data filed at 12/15/11 0947  Gross per 24 hour  Intake   1505 ml  Output   3450 ml  Net  -1945 ml   Lab Results  Component Value Date   HGBA1C  Value: 5.6 (NOTE)   The ADA recommends the following therapeutic goals for glycemic   control related to Hgb A1C measurement:   Goal of Therapy:   < 7.0% Hgb A1C   Action Suggested:  > 8.0% Hgb A1C   Ref:  Diabetes Care, 22, Suppl. 1, 1999 08/17/2007   Lab Results  Component Value Date   LDLCALC 100* 12/09/2007     CREATININE 0.39* 12/15/2011    Review of Systems As above, otherwise all reviewed and reported negative  Physical Exam General - awake, cachectic, no distress, cooperative  HEENT - NGT in place, NCAT, MMM Lungs - BBS, CTA  CV - normal s1, s2 sounds  Abd - soft, nondistended, BS heard, mild LUQ TTP, no guarding  Ext - no C/C/E  Lab Results: Results for orders placed during the hospital encounter of 12/11/11 (from the past 24 hour(s))  GLUCOSE, CAPILLARY     Status: Abnormal   Collection Time   12/14/11  3:59 PM      Component Value Range   Glucose-Capillary 143 (*) 70 - 99 mg/dL   Comment 1 Notify RN    GLUCOSE, CAPILLARY     Status: Abnormal   Collection Time   12/14/11 11:23 PM      Component Value Range   Glucose-Capillary 125 (*) 70 - 99 mg/dL   Comment 1 Notify RN    GLUCOSE, CAPILLARY     Status: Abnormal   Collection Time   12/15/11  8:05 AM      Component Value Range   Glucose-Capillary 138 (*) 70 - 99 mg/dL  BASIC METABOLIC PANEL     Status: Abnormal   Collection Time   12/15/11  9:45 AM      Component Value  Range   Sodium 129 (*) 135 - 145 mEq/L   Potassium 4.3  3.5 - 5.1 mEq/L   Chloride 99  96 - 112 mEq/L   CO2 25  19 - 32 mEq/L   Glucose, Bld 128 (*) 70 - 99 mg/dL   BUN 9  6 - 23 mg/dL   Creatinine, Ser 2.13 (*) 0.50 - 1.10 mg/dL   Calcium 6.1 (*) 8.4 - 10.5 mg/dL   GFR calc non Af Amer >90  >90 mL/min   GFR calc Af Amer >90  >90 mL/min  MAGNESIUM     Status: Normal   Collection Time   12/15/11  9:45 AM      Component Value Range   Magnesium 1.9  1.5 - 2.5 mg/dL  PHOSPHORUS     Status: Abnormal   Collection Time   12/15/11  9:45 AM      Component Value Range   Phosphorus 1.9 (*) 2.3 - 4.6 mg/dL    Micro Results: No results found for this or any previous visit (from the past 240 hour(s)).  Medications:  Scheduled Meds:   . alteplase  2 mg Intracatheter Once  . bisacodyl  10 mg Rectal Daily  . chlorhexidine  15 mL Mouth Rinse BID  .  ciprofloxacin  400 mg Intravenous BID  . enoxaparin (LOVENOX) injection  30 mg Subcutaneous Q24H  . fentaNYL  50 mcg Transdermal Q72H  . insulin aspart  0-9 Units Subcutaneous Q8H  . lip balm  1 application Topical BID  . metoprolol  2.5 mg Intravenous Q6H  . olopatadine  1 drop Both Eyes BID  . pantoprazole (PROTONIX) IV  40 mg Intravenous Q12H  . potassium phosphate IVPB (mmol)  20 mmol Intravenous Once  . sodium chloride  10-40 mL Intracatheter Q12H  . tiotropium  18 mcg Inhalation Daily  . zolpidem  5 mg Oral Once   Continuous Infusions:   . fat emulsion 250 mL (12/14/11 1734)  . TPN (CLINIMIX) +/- additives 40 mL/hr at 12/13/11 1718  . TPN (CLINIMIX) +/- additives 50 mL/hr at 12/14/11 1733   PRN Meds:.acetaminophen, albuterol, diphenhydrAMINE, fentaNYL, magic mouthwash, menthol-cetylpyridinium, nitroGLYCERIN, ondansetron (ZOFRAN) IV, ondansetron, phenol, promethazine, pseudoephedrine, sodium chloride  Assessment/Plan: UTI - continue IV ciprofloxacin until she can take oral meds   Recurrent High grade SBO - some clinical improvement noted,  mgmt per [facility],   - following abd films, most recent xray didn't show much improvement in SBO  Severe Protein Calorie Malnutrition  - Pt started on TNA and tolerating it currently well   Hypocalcemia/low Vit D  - Pt is NPO, given IV calcium today, follow Ca, start Vit D and oral calcium when can take p.o.  - mild improvement noted after IV calcium  Hyponatremia   - following sodium level  - suspect fluid overload from TNA, IVFs,   CAD/ AF/ Cardiomyopathy/ CHF/PTVP  - currently compensated, stable   COPD O2 Dependant, - continue oxygen support   Chronic pain syndrome/ chronic abdominal pain/fibromyalgia on multiple pain meds at home.   Chronic Constipation - laxatives as needed, pt starting to have BMs now   Hx of gastroparesis  Hx Gi Bleed  Restless leg   Hypertension - stable, controlled   LOS: 4 days   Clanford  Johnson 12/15/2011, 10:45 AM  Cleora Fleet, MD, CDE, FAAFP [facility] [place], [place]  [phone]

## 2011-12-15 NOTE — Progress Notes (Signed)
PARENTERAL NUTRITION CONSULT NOTE   Pharmacy Consult for TNA Indication: Bowel obstruction  Allergies  Allergen Reactions  . Dabigatran Etexilate Mesylate Other (See Comments)    INTERNAL BLEEDING  . Talwin (Pentazocine) Other (See Comments)    hallucinations  . Pentazocine Lactate Other (See Comments)    HALLUCINATIONS  . Sulfonamide Derivatives Other (See Comments)    JITTERINESS    Patient Measurements: Height: 5\' 2"  (157.5 cm) Weight: 94 lb 5.7 oz (42.8 kg) IBW/kg (Calculated) : 50.1  Usual Weight: 98lb  Vital Signs: Temp: 97.5 F (36.4 C) (09/28 0455) Temp src: Oral (09/28 0455) BP: 124/74 mmHg (09/28 0455) Pulse Rate: 100  (09/28 0455) Intake/Output from previous day: 09/27 0701 - 09/28 0700 In: 1495 [IV Piggyback:200; TPN:1295] Out: 3450 [Urine:3250; Emesis/NG output:200] Intake/Output from this shift: Total I/O In: 10 [I.V.:10] Out: -   Labs:  [facility] 12/13/11 0645  WBC 5.7  HGB 9.9*  HCT 30.6*  PLT 251  APTT --  INR --     Basename 12/15/11 0945 12/14/11 0640 12/13/11 0645 12/13/11 0500  NA 129* 132* 133* --  K 4.3 3.9 3.3* --  CL 99 99 102 --  CO2 25 25 25  --  GLUCOSE 128* 142* 122* --  BUN 9 8 19  --  CREATININE 0.39* 0.38* 0.49* --  LABCREA -- -- -- --  CREAT24HRUR -- -- -- --  CALCIUM 6.1* 5.4* 4.7* --  MG 1.9 1.7 1.7 --  PHOS 1.9* 1.7* 1.8* --  PROT -- 4.9* 4.3* --  ALBUMIN -- 1.9* 1.8* --  AST -- 12 12 --  ALT -- 7 7 --  ALKPHOS -- 70 63 --  BILITOT -- 0.2* 0.1* --  BILIDIR -- -- -- --  IBILI -- -- -- --  PREALBUMIN -- -- 8.3* --  TRIG -- -- -- 117  CHOLHDL -- -- -- --  CHOL -- -- -- 83  Corrected Calcium=7.1 Estimated Creatinine Clearance: 46.1 ml/min (by C-G formula based on Cr of 0.39).    Basename 12/15/11 0805 12/14/11 2323 12/14/11 1559  GLUCAP 138* 125* 143*   Medications:  Infusions:     . fat emulsion 250 mL (12/14/11 1734)  . TPN (CLINIMIX) +/- additives 40 mL/hr at 12/13/11 1718  . TPN (CLINIMIX) +/-  additives 50 mL/hr at 12/14/11 1733    CBGs and Insulin Requirements in the past 24 hours:  CBGs: < 150 - 1 unit SSI given  Nutritional Goals: RD recommendations from 9/25 are 1500-1700 kCal, 65-75 grams of protein per day. -Goal rate TNA Clinimix E 5/20 at 60 ml/hr delivers 72 gm protein, avg 1473 kcal/day. (kcal MWF = 1746; Tu,Th,Sa,Su = 1267)  Current Nutrition:  Clinimix E 5/20 at 40 ml/hr Diet: NPO, NG to suction.  Labs:  Electrolytes: K improved, wnl; Phos remains low; Ca improved but still low; Na slightly low.   Renal/Hepatic function: wnl  Pre-albumin: 8.3 on 9/26  TG/Cholesterol:117 on 9/26   Assessment:   29 YOF admitted with recurrent high-grade small bowel obstruction (hx of multiple abdominal surgeries).  Electrolytes as above.  CBGs trending up.  TNA remains at initial rate of 40 ml/hr.  Plan:  Calcium Gluconate 1 gram IV x 1  Na Phos 20mM IV x 1  Add sliding scale Novolog q8h, sensitive scale.  Advance Clinimix E 5/20 to goal 60 ml/hr  Fat emulsion, MVI and trace elements MWF only due to ongoing shortage.  BMet, Mag, Phos in AM   Hessie Knows, PharmD, BCPS  Pager [phone] 12/15/2011 10:51 AM

## 2011-12-15 NOTE — Progress Notes (Signed)
General Surgery Note  LOS: 4 days  Room - 1313  Assessment/Plan: 1.  SBO -  Gastrocutaneous fistula closed [name] - 07/13/2011  Last enterolysis - [name] - Feb 2011.  Adhesions of intestine, very dense, with frozen abdomen   Had BM and flatus.    To remove NGT.  Probably restart diet tomorrow.  2.  Protein-calorie malnutrition, moderate (12/11/2011)  On TPN 3.  Atrial fibrillation and Atrial flutter  4.  Pacemaker - sees Dr. Odessa Fleming  5.  SYSTOLIC HEART FAILURE, CHRONIC (11/17/2009) 6.  COPD 7.  GERD    8.  Chronic hypoxemic respiratory failure  9.  UTI (urinary tract infection)  10.  Vitamin d deficiency  75.  DVT prophylaxis - on Lovenox 12.  Needs to ambulate more  Subjective:  Doing well.  Passing flatus and had BM.  Still sore in left mid abdomen, but better overall.  Wants NGT out. Objective:   Filed Vitals:   12/15/11 0455  BP: 124/74  Pulse: 100  Temp: 97.5 F (36.4 C)  Resp: 17     Intake/Output from previous day:  09/27 0701 - 09/28 0700 In: 1495 [IV Piggyback:200; TPN:1295] Out: 3450 [Urine:3250; Emesis/NG output:200]  Intake/Output this shift:  Total I/O In: 10 [I.V.:10] Out: -    Physical Exam:   General: Thin older WF who is alert and oriented.    HEENT: Normal. Pupils equal. .   Lungs: Clear   Abdomen: Flat, good BS   Neurologic:  Grossly intact to motor and sensory function.   Psychiatric: Has normal mood and affect.   Lab Results:    [facility] 12/13/11 0645  WBC 5.7  HGB 9.9*  HCT 30.6*  PLT 251    BMET   Basename 12/15/11 0945 12/14/11 0640  NA 129* 132*  K 4.3 3.9  CL 99 99  CO2 25 25  GLUCOSE 128* 142*  BUN 9 8  CREATININE 0.39* 0.38*  CALCIUM 6.1* 5.4*    PT/INR  No results found for this basename: LABPROT:2,INR:2 in the last 72 hours  ABG  No results found for this basename: PHART:2,PCO2:2,PO2:2,HCO3:2 in the last 72 hours   Studies/Results:  Dg Abd 2 Views  12/14/2011  *RADIOLOGY REPORT*  Clinical Data:  Small bowel obstruction  ABDOMEN - 2 VIEW  Comparison: Yesterday  Findings: Small bowel distention has increased.  Colonic gas is present.  There is no free intraperitoneal gas.  Stable NG tube with its tip in the fundus of the stomach.  Air fluid levels persist.  IMPRESSION: Worsening partial small bowel obstruction pattern.  No free intraperitoneal gas.   Original Report Authenticated By: Donavan Burnet, M.D.      Anti-infectives:   Anti-infectives     Start     Dose/Rate Route Frequency Ordered Stop   12/11/11 2345   ciprofloxacin (CIPRO) IVPB 400 mg        400 mg 200 mL/hr over 60 Minutes Intravenous 2 times daily 12/11/11 2339            Ovidio Kin, MD, FACS Pager: [phone],   [facility] Office: [phone] 12/15/2011

## 2011-12-16 DIAGNOSIS — E440 Moderate protein-calorie malnutrition: Secondary | ICD-10-CM

## 2011-12-16 LAB — BASIC METABOLIC PANEL
BUN: 14 mg/dL (ref 6–23)
CO2: 22 mEq/L (ref 19–32)
Calcium: 6.8 mg/dL — ABNORMAL LOW (ref 8.4–10.5)
Chloride: 102 mEq/L (ref 96–112)
Creatinine, Ser: 0.4 mg/dL — ABNORMAL LOW (ref 0.50–1.10)
GFR calc Af Amer: >90 mL/min (ref >90)
GFR calc non Af Amer: >90 mL/min (ref >90)
Glucose, Bld: 106 mg/dL — ABNORMAL HIGH (ref 70–99)
Potassium: 4.8 mEq/L (ref 3.5–5.1)
Sodium: 130 mEq/L — ABNORMAL LOW (ref 135–145)

## 2011-12-16 LAB — GLUCOSE, CAPILLARY
Glucose-Capillary: 110 mg/dL — ABNORMAL HIGH (ref 70–99)
Glucose-Capillary: 133 mg/dL — ABNORMAL HIGH (ref 70–99)
Glucose-Capillary: 134 mg/dL — ABNORMAL HIGH (ref 70–99)

## 2011-12-16 LAB — PHOSPHORUS: Phosphorus: 2 mg/dL — ABNORMAL LOW (ref 2.3–4.6)

## 2011-12-16 LAB — OCCULT BLOOD X 1 CARD TO LAB, STOOL: Fecal Occult Bld: NEGATIVE

## 2011-12-16 MED ORDER — SODIUM PHOSPHATE 3 MMOLE/ML IV SOLN
20.0000 mmol | Freq: Once | INTRAVENOUS | Status: AC
  Administered 2011-12-16: 20 mmol via INTRAVENOUS
  Filled 2011-12-16: qty 6.67

## 2011-12-16 MED ORDER — CLINIMIX E/DEXTROSE (5/20) 5 % IV SOLN
INTRAVENOUS | Status: DC
  Administered 2011-12-16: 18:00:00 via INTRAVENOUS
  Filled 2011-12-16: qty 2000

## 2011-12-16 NOTE — Progress Notes (Signed)
PARENTERAL NUTRITION CONSULT NOTE   Pharmacy Consult for TNA Indication: Bowel obstruction  Allergies  Allergen Reactions  . Dabigatran Etexilate Mesylate Other (See Comments)    INTERNAL BLEEDING  . Talwin (Pentazocine) Other (See Comments)    hallucinations  . Pentazocine Lactate Other (See Comments)    HALLUCINATIONS  . Sulfonamide Derivatives Other (See Comments)    JITTERINESS    Patient Measurements: Height: 5\' 2"  (157.5 cm) Weight: 94 lb 5.7 oz (42.8 kg) IBW/kg (Calculated) : 50.1  Usual Weight: 98lb  Vital Signs: Temp: 98.2 F (36.8 C) (09/29 0530) Temp src: Oral (09/29 0530) BP: 120/49 mmHg (09/29 0530) Pulse Rate: 93  (09/29 0530) Intake/Output from previous day: 09/28 0701 - 09/29 0700 In: 1739.5 [I.V.:10; IV Piggyback:300; TPN:1429.5] Out: 1400 [Urine:1400] Intake/Output from this shift: Total I/O In: 240 [P.O.:240] Out: 300 [Urine:300]  Labs: No results found for this basename: WBC:3,HGB:3,HCT:3,PLT:3,APTT:3,INR:3 in the last 72 hours   Basename 12/16/11 1247 12/15/11 0945 12/14/11 0640  NA 130* 129* 132*  K 4.8 4.3 3.9  CL 102 99 99  CO2 22 25 25   GLUCOSE 106* 128* 142*  BUN 14 9 8   CREATININE 0.40* 0.39* 0.38*  LABCREA -- -- --  CREAT24HRUR -- -- --  CALCIUM 6.8* 6.1* 5.4*  MG -- 1.9 1.7  PHOS 2.0* 1.9* 1.7*  PROT -- -- 4.9*  ALBUMIN -- -- 1.9*  AST -- -- 12  ALT -- -- 7  ALKPHOS -- -- 70  BILITOT -- -- 0.2*  BILIDIR -- -- --  IBILI -- -- --  PREALBUMIN -- -- --  TRIG -- -- --  CHOLHDL -- -- --  CHOL -- -- --  Corrected Calcium=8.5 Estimated Creatinine Clearance: 46.1 ml/min (by C-G formula based on Cr of 0.4).    Basename 12/16/11 0742 12/15/11 2355 12/15/11 1626  GLUCAP 133* 98 115*   Medications:  Infusions:     . fat emulsion 250 mL (12/14/11 1734)  . TPN (CLINIMIX) +/- additives 50 mL/hr at 12/14/11 1733  . TPN (CLINIMIX) +/- additives 60 mL/hr at 12/15/11 1800    CBGs and Insulin Requirements in the past 24 hours:   CBGs: < 150 - 1 unit SSI given  Nutritional Goals: RD recommendations from 9/25 are 1500-1700 kCal, 65-75 grams of protein per day. -Goal rate TNA Clinimix E 5/20 at 60 ml/hr delivers 72 gm protein, avg 1473 kcal/day. (kcal MWF = 1746; Tu,Th,Sa,Su = 1267)  Current Nutrition:  Clinimix E 5/20 at 40 ml/hr Diet: NGT out - [place] diet  Labs:  Electrolytes:Phos remains low; Corrected Ca finally WNL; Na slightly low.   Renal/Hepatic function: wnl  Pre-albumin: 8.3 on 9/26  TG/Cholesterol:117 on 9/26   Assessment:   54 YOF admitted with recurrent high-grade small bowel obstruction (hx of multiple abdominal surgeries).  Electrolytes as above.  CBGs trending up.  TNA now at goal rate of 60 ml/hr.  Plan:  Na Phos 20mM IV x 1  Continue Clinimix E 5/20 at goal rate 60 ml/hr  Fat emulsion, MVI and trace elements MWF only due to ongoing shortage.  TNA labs in AM  Noted plan per [facility] that if tolerates FL diet may stop TNA tomorrow. Goal rate only 60 ml/hr so if stopped tomorrow will be easy to decrease rate slightly and turn off TNA at [address] PharmD, BCPS Pager [phone] 12/16/2011 1:33 PM

## 2011-12-16 NOTE — Progress Notes (Signed)
General Surgery Note  LOS: 5 days  Room - 1313  Assessment/Plan: 1.  SBO -  Gastrocutaneous fistula closed Dwain Sarna - 07/13/2011  Last enterolysis - Dwain Sarna - Feb 2011.  Adhesions of intestine, very dense, with frozen abdomen   Had BM yesterday and flatus.    Tolerate NGT out.   Will start with full liquids.  2.  Protein-calorie malnutrition, moderate (12/11/2011)  On TPN.  If she tolerates diet, may stop this tomorrow. 3.  Atrial fibrillation and Atrial flutter  4.  Pacemaker - sees Dr. Odessa Fleming  5.  SYSTOLIC HEART FAILURE, CHRONIC (11/17/2009) 6.  COPD 7.  GERD    8.  Chronic hypoxemic respiratory failure  9.  UTI (urinary tract infection)  10.  Vitamin d deficiency  16.  DVT prophylaxis - on Lovenox 12.  Needs to ambulate more  Subjective:  Doing well.  Passing flatus and had BM.  Hungry for tomato soup.  Objective:   Filed Vitals:   12/16/11 0530  BP: 120/49  Pulse: 93  Temp: 98.2 F (36.8 C)  Resp: 16     Intake/Output from previous day:  09/28 0701 - 09/29 0700 In: 1739.5 [I.V.:10; IV Piggyback:300; TPN:1429.5] Out: 1400 [Urine:1400]  Intake/Output this shift:      Physical Exam:   General: Thin older WF who is alert and oriented.    HEENT: Normal. Pupils equal. .   Lungs: Clear   Abdomen: Flat, good BS.  No soreness today.   Neurologic:  Grossly intact to motor and sensory function.   Psychiatric: Has normal mood and affect.   Lab Results:   No results found for this basename: WBC:2,HGB:2,HCT:2,PLT:2 in the last 72 hours  BMET    Basename 12/15/11 0945 12/14/11 0640  NA 129* 132*  K 4.3 3.9  CL 99 99  CO2 25 25  GLUCOSE 128* 142*  BUN 9 8  CREATININE 0.39* 0.38*  CALCIUM 6.1* 5.4*    PT/INR  No results found for this basename: LABPROT:2,INR:2 in the last 72 hours  ABG  No results found for this basename: PHART:2,PCO2:2,PO2:2,HCO3:2 in the last 72 hours   Studies/Results:  Dg Abd 2 Views  12/14/2011  *RADIOLOGY REPORT*  Clinical  Data: Small bowel obstruction  ABDOMEN - 2 VIEW  Comparison: Yesterday  Findings: Small bowel distention has increased.  Colonic gas is present.  There is no free intraperitoneal gas.  Stable NG tube with its tip in the fundus of the stomach.  Air fluid levels persist.  IMPRESSION: Worsening partial small bowel obstruction pattern.  No free intraperitoneal gas.   Original Report Authenticated By: Donavan Burnet, M.D.      Anti-infectives:   Anti-infectives     Start     Dose/Rate Route Frequency Ordered Stop   12/11/11 2345   ciprofloxacin (CIPRO) IVPB 400 mg        400 mg 200 mL/hr over 60 Minutes Intravenous 2 times daily 12/11/11 2339            Ovidio Kin, MD, FACS Pager: [phone],   [facility] Office: [phone] 12/16/2011

## 2011-12-16 NOTE — Progress Notes (Addendum)
[facility] Progress Note  12/16/2011  HPI  Pleasant female with numerous prior abdominal surgeries Including hysterectomy, appendectomy, and partial colectomy. History of small bowel obstructions in the past. Had to undergo extensive lyses of adhesions for over four hours and 2012. He denied could not be completed secondary to dense adhesions and poor tissue quality. Eventually improved. She has chronic intermittent distention and nausea. Chronic pain. Patient notes worsening abdominal pain and distention over past 4 days PTA pain tended to be in the right lower side. She began to have emesis. Still a little flatus. Last bowel movement a few days ago. Based on concerns, she came emergency room. Workup and CT scan very suspicious for bowel obstruction. Urinalysis pushes for her urinary tract infection as well. We are asked to be involved. Has numerous health issues including his numerous pulmonary and cardiac issues. On chronic antiarrhythmic medications.  Subjective: Pt started full liquid diet and had some tomato soup this morning and coffee.  She has tolerated it so far.  She is having a headache this morning.     Objective:  Vital signs in last 24 hours: Filed Vitals:   12/15/11 1433 12/15/11 2100 12/16/11 0530 12/16/11 0849  BP: 134/66 132/65 120/49   Pulse: 93 87 93   Temp: 97.9 F (36.6 C) 97.7 F (36.5 C) 98.2 F (36.8 C)   TempSrc: Oral Oral Oral   Resp: 18 16 16    Height:      Weight:      SpO2: 100% 100% 100% 98%   Weight change:   Intake/Output Summary (Last 24 hours) at 12/16/11 1136 Last data filed at 12/16/11 0530  Gross per 24 hour  Intake 1729.5 ml  Output   1400 ml  Net  329.5 ml   Lab Results  Component Value Date   HGBA1C  Value: 5.6 (NOTE)   The ADA recommends the following therapeutic goals for glycemic   control related to Hgb A1C measurement:   Goal of Therapy:   < 7.0% Hgb A1C   Action Suggested:  > 8.0% Hgb A1C   Ref:  Diabetes Care, 22, Suppl. 1,  1999 08/17/2007   Lab Results  Component Value Date   LDLCALC 100* 12/09/2007   CREATININE 0.39* 12/15/2011    Review of Systems As above, otherwise all reviewed and reported negative  Physical Exam General - awake, cachectic, no distress, cooperative  HEENT - NGT in place, NCAT, MMM Lungs - BBS, CTA  CV - normal s1, s2 sounds  Abd - soft, nondistended, BS heard, mild LUQ TTP, no guarding  Ext - no C/C/E  Lab Results: Results for orders placed during the hospital encounter of 12/11/11 (from the past 24 hour(s))  GLUCOSE, CAPILLARY     Status: Abnormal   Collection Time   12/15/11  4:26 PM      Component Value Range   Glucose-Capillary 115 (*) 70 - 99 mg/dL   Comment 1 Documented in Chart     Comment 2 Notify RN    GLUCOSE, CAPILLARY     Status: Normal   Collection Time   12/15/11 11:55 PM      Component Value Range   Glucose-Capillary 98  70 - 99 mg/dL   Comment 1 Notify RN    GLUCOSE, CAPILLARY     Status: Abnormal   Collection Time   12/16/11  7:42 AM      Component Value Range   Glucose-Capillary 133 (*) 70 - 99 mg/dL  Micro Results: No results found for this or any previous visit (from the past 240 hour(s)).  Medications:  Scheduled Meds:   . bisacodyl  10 mg Rectal Daily  . calcium gluconate  1 g Intravenous Once  . chlorhexidine  15 mL Mouth Rinse BID  . ciprofloxacin  400 mg Intravenous BID  . enoxaparin (LOVENOX) injection  30 mg Subcutaneous Q24H  . fentaNYL  50 mcg Transdermal Q72H  . insulin aspart  0-9 Units Subcutaneous Q8H  . lip balm  1 application Topical BID  . metoprolol  2.5 mg Intravenous Q6H  . olopatadine  1 drop Both Eyes BID  . pantoprazole (PROTONIX) IV  40 mg Intravenous Q12H  . sodium chloride  10-40 mL Intracatheter Q12H  . sodium phosphate  Dextrose 5% IVPB  20 mmol Intravenous Once  . tiotropium  18 mcg Inhalation Daily   Continuous Infusions:   . fat emulsion 250 mL (12/14/11 1734)  . TPN (CLINIMIX) +/- additives 50 mL/hr at  12/14/11 1733  . TPN (CLINIMIX) +/- additives 60 mL/hr at 12/15/11 1800   PRN Meds:.acetaminophen, albuterol, diphenhydrAMINE, fentaNYL, magic mouthwash, menthol-cetylpyridinium, nitroGLYCERIN, ondansetron (ZOFRAN) IV, ondansetron, phenol, promethazine, pseudoephedrine, sodium chloride, zolpidem  Assessment/Plan: UTI - complete cipro today  Recurrent High grade SBO - some clinical improvement noted, mgmt per [facility],  - starting full liquid diet today - if tolerates today will try oral meds tomorrow  Severe Protein Calorie Malnutrition  - Pt had been on TNA and now on full liquids   Hypocalcemia/low Vit D  - Pt is NPO, give IV calcium today, follow Ca, start Vit D and oral calcium when can take p.o.  - mild improvement noted after IV calcium   Hyponatremia  - following sodium level   CAD/ AF/ Cardiomyopathy/ CHF/PTVP  - currently compensated, stable   COPD O2 Dependant, - continue oxygen support   Chronic pain syndrome/ chronic abdominal pain/fibromyalgia on multiple pain meds at home.   Chronic Constipation - laxatives as needed, pt starting to have BMs now  Hx of gastroparesis  Hx Gi Bleed  Restless leg   Hypertension - stable, controlled   LOS: 5 days   Jamas Jaquay 12/16/2011, 11:36 AM  Cleora Fleet, MD, CDE, FAAFP [facility] [place], [place]  [phone]

## 2011-12-17 LAB — COMPREHENSIVE METABOLIC PANEL
ALT: 6 U/L (ref 0–35)
AST: 9 U/L (ref 0–37)
Albumin: 1.9 g/dL — ABNORMAL LOW (ref 3.5–5.2)
Alkaline Phosphatase: 68 U/L (ref 39–117)
BUN: 14 mg/dL (ref 6–23)
CO2: 25 mEq/L (ref 19–32)
Calcium: 6.9 mg/dL — ABNORMAL LOW (ref 8.4–10.5)
Chloride: 101 mEq/L (ref 96–112)
Creatinine, Ser: 0.49 mg/dL — ABNORMAL LOW (ref 0.50–1.10)
GFR calc Af Amer: >90 mL/min (ref >90)
GFR calc non Af Amer: >90 mL/min (ref >90)
Glucose, Bld: 121 mg/dL — ABNORMAL HIGH (ref 70–99)
Potassium: 5 mEq/L (ref 3.5–5.1)
Sodium: 132 mEq/L — ABNORMAL LOW (ref 135–145)
Total Bilirubin: 0.1 mg/dL — ABNORMAL LOW (ref 0.3–1.2)
Total Protein: 5 g/dL — ABNORMAL LOW (ref 6.0–8.3)

## 2011-12-17 LAB — DIFFERENTIAL
Basophils Absolute: 0 10*3/uL (ref 0.0–0.1)
Basophils Relative: 0 % (ref 0–1)
Eosinophils Absolute: 0.2 10*3/uL (ref 0.0–0.7)
Eosinophils Relative: 3 % (ref 0–5)
Lymphocytes Relative: 10 % — ABNORMAL LOW (ref 12–46)
Lymphs Abs: 0.7 10*3/uL (ref 0.7–4.0)
Monocytes Absolute: 0.8 10*3/uL (ref 0.1–1.0)
Monocytes Relative: 12 % (ref 3–12)
Neutro Abs: 5 10*3/uL (ref 1.7–7.7)
Neutrophils Relative %: 75 % (ref 43–77)

## 2011-12-17 LAB — GLUCOSE, CAPILLARY
Glucose-Capillary: 153 mg/dL — ABNORMAL HIGH (ref 70–99)
Glucose-Capillary: 120 mg/dL — ABNORMAL HIGH (ref 70–99)
Glucose-Capillary: 93 mg/dL (ref 70–99)

## 2011-12-17 LAB — PREALBUMIN: Prealbumin: 14.6 mg/dL — ABNORMAL LOW (ref 17.0–34.0)

## 2011-12-17 LAB — CBC
HCT: 33.5 % — ABNORMAL LOW (ref 36.0–46.0)
Hemoglobin: 11.2 g/dL — ABNORMAL LOW (ref 12.0–15.0)
MCH: 29.8 pg (ref 26.0–34.0)
MCHC: 33.4 g/dL (ref 30.0–36.0)
MCV: 89.1 fL (ref 78.0–100.0)
Platelets: 236 10*3/uL (ref 150–400)
RBC: 3.76 MIL/uL — ABNORMAL LOW (ref 3.87–5.11)
RDW: 13.2 % (ref 11.5–15.5)
WBC: 6.8 10*3/uL (ref 4.0–10.5)

## 2011-12-17 LAB — TRIGLYCERIDES: Triglycerides: 151 mg/dL — ABNORMAL HIGH (ref <150)

## 2011-12-17 LAB — CHOLESTEROL, TOTAL: Cholesterol: 107 mg/dL (ref 0–200)

## 2011-12-17 LAB — MAGNESIUM: Magnesium: 2.2 mg/dL (ref 1.5–2.5)

## 2011-12-17 LAB — PHOSPHORUS: Phosphorus: 2.5 mg/dL (ref 2.3–4.6)

## 2011-12-17 MED ORDER — CLINIMIX E/DEXTROSE (5/20) 5 % IV SOLN
INTRAVENOUS | Status: DC
Start: 2011-12-17 — End: 2011-12-17
  Filled 2011-12-17: qty 2000

## 2011-12-17 MED ORDER — VITAMIN D (ERGOCALCIFEROL) 1.25 MG (50000 UNIT) PO CAPS
50000.0000 [IU] | ORAL_CAPSULE | ORAL | Status: DC
  Administered 2011-12-17: 50000 [IU] via ORAL
  Filled 2011-12-17: qty 1

## 2011-12-17 MED ORDER — CALCIUM CARBONATE-VITAMIN D 500-200 MG-UNIT PO TABS
1.0000 | ORAL_TABLET | Freq: Two times a day (BID) | ORAL | Status: DC
  Administered 2011-12-17 – 2011-12-18 (×2): 1 via ORAL
  Filled 2011-12-17 (×3): qty 1

## 2011-12-17 MED ORDER — PANTOPRAZOLE SODIUM 40 MG PO PACK
40.0000 mg | PACK | Freq: Two times a day (BID) | ORAL | Status: DC
  Administered 2011-12-17: 40 mg via ORAL
  Filled 2011-12-17 (×4): qty 20

## 2011-12-17 NOTE — Progress Notes (Signed)
[facility] Progress Note  12/17/2011  HPI  Pleasant female with numerous prior abdominal surgeries Including hysterectomy, appendectomy, and partial colectomy. History of small bowel obstructions in the past. Had to undergo extensive lyses of adhesions for over four hours and 2012. He denied could not be completed secondary to dense adhesions and poor tissue quality. Eventually improved. She has chronic intermittent distention and nausea. Chronic pain. Patient notes worsening abdominal pain and distention over past 4 days PTA pain tended to be in the right lower side. She began to have emesis. Still a little flatus. Last bowel movement a few days ago. Based on concerns, she came emergency room. Workup and CT scan very suspicious for bowel obstruction. Urinalysis pushes for her urinary tract infection as well. We are asked to be involved. Has numerous health issues including his numerous pulmonary and cardiac issues. On chronic antiarrhythmic medications.  Subjective: Pt tolerated her full liquid diet well.    Objective:  Vital signs in last 24 hours: Filed Vitals:   12/17/11 0454 12/17/11 0807 12/17/11 1200 12/17/11 1350  BP: 116/54  141/69 126/68  Pulse: 86  92 87  Temp: 97.9 F (36.6 C)   98 F (36.7 C)  TempSrc: Oral   Oral  Resp: 16   20  Height:      Weight:      SpO2: 100% 95%  100%   Weight change:   Intake/Output Summary (Last 24 hours) at 12/17/11 1512 Last data filed at 12/17/11 1335  Gross per 24 hour  Intake   2855 ml  Output   1350 ml  Net   1505 ml   Lab Results  Component Value Date   HGBA1C  Value: 5.6 (NOTE)   The ADA recommends the following therapeutic goals for glycemic   control related to Hgb A1C measurement:   Goal of Therapy:   < 7.0% Hgb A1C   Action Suggested:  > 8.0% Hgb A1C   Ref:  Diabetes Care, 22, Suppl. 1, 1999 08/17/2007   Lab Results  Component Value Date   LDLCALC 100* 12/09/2007   CREATININE 0.49* 12/17/2011    Review of Systems As  above, otherwise all reviewed and reported negative  Physical Exam General - awake, cachectic, no distress, cooperative  HEENT - NCAT, MMM Lungs - BBS, CTA  CV - normal s1, s2 sounds  Abd - soft, nondistended, BS heard, no guarding  Ext - no C/C/E  Lab Results: Results for orders placed during the hospital encounter of 12/11/11 (from the past 24 hour(s))  GLUCOSE, CAPILLARY     Status: Abnormal   Collection Time   12/16/11  4:42 PM      Component Value Range   Glucose-Capillary 134 (*) 70 - 99 mg/dL  OCCULT BLOOD X 1 CARD TO LAB, STOOL     Status: Normal   Collection Time   12/16/11  8:06 PM      Component Value Range   Fecal Occult Bld NEGATIVE    GLUCOSE, CAPILLARY     Status: Abnormal   Collection Time   12/16/11 11:49 PM      Component Value Range   Glucose-Capillary 110 (*) 70 - 99 mg/dL   Comment 1 Notify RN    COMPREHENSIVE METABOLIC PANEL     Status: Abnormal   Collection Time   12/17/11  7:05 AM      Component Value Range   Sodium 132 (*) 135 - 145 mEq/L   Potassium 5.0  3.5 -  5.1 mEq/L   Chloride 101  96 - 112 mEq/L   CO2 25  19 - 32 mEq/L   Glucose, Bld 121 (*) 70 - 99 mg/dL   BUN 14  6 - 23 mg/dL   Creatinine, Ser 1.61 (*) 0.50 - 1.10 mg/dL   Calcium 6.9 (*) 8.4 - 10.5 mg/dL   Total Protein 5.0 (*) 6.0 - 8.3 g/dL   Albumin 1.9 (*) 3.5 - 5.2 g/dL   AST 9  0 - 37 U/L   ALT 6  0 - 35 U/L   Alkaline Phosphatase 68  39 - 117 U/L   Total Bilirubin 0.1 (*) 0.3 - 1.2 mg/dL   GFR calc non Af Amer >90  >90 mL/min   GFR calc Af Amer >90  >90 mL/min  MAGNESIUM     Status: Normal   Collection Time   12/17/11  7:05 AM      Component Value Range   Magnesium 2.2  1.5 - 2.5 mg/dL  PHOSPHORUS     Status: Normal   Collection Time   12/17/11  7:05 AM      Component Value Range   Phosphorus 2.5  2.3 - 4.6 mg/dL  CBC     Status: Abnormal   Collection Time   12/17/11  7:05 AM      Component Value Range   WBC 6.8  4.0 - 10.5 K/uL   RBC 3.76 (*) 3.87 - 5.11 MIL/uL    Hemoglobin 11.2 (*) 12.0 - 15.0 g/dL   HCT 09.6 (*) 04.5 - 40.9 %   MCV 89.1  78.0 - 100.0 fL   MCH 29.8  26.0 - 34.0 pg   MCHC 33.4  30.0 - 36.0 g/dL   RDW 81.1  91.4 - 78.2 %   Platelets 236  150 - 400 K/uL  DIFFERENTIAL     Status: Abnormal   Collection Time   12/17/11  7:05 AM      Component Value Range   Neutrophils Relative 75  43 - 77 %   Neutro Abs 5.0  1.7 - 7.7 K/uL   Lymphocytes Relative 10 (*) 12 - 46 %   Lymphs Abs 0.7  0.7 - 4.0 K/uL   Monocytes Relative 12  3 - 12 %   Monocytes Absolute 0.8  0.1 - 1.0 K/uL   Eosinophils Relative 3  0 - 5 %   Eosinophils Absolute 0.2  0.0 - 0.7 K/uL   Basophils Relative 0  0 - 1 %   Basophils Absolute 0.0  0.0 - 0.1 K/uL  CHOLESTEROL, TOTAL     Status: Normal   Collection Time   12/17/11  7:05 AM      Component Value Range   Cholesterol 107  0 - 200 mg/dL  TRIGLYCERIDES     Status: Abnormal   Collection Time   12/17/11  7:05 AM      Component Value Range   Triglycerides 151 (*) <150 mg/dL  PREALBUMIN     Status: Abnormal   Collection Time   12/17/11  7:05 AM      Component Value Range   Prealbumin 14.6 (*) 17.0 - 34.0 mg/dL  GLUCOSE, CAPILLARY     Status: Abnormal   Collection Time   12/17/11  7:48 AM      Component Value Range   Glucose-Capillary 153 (*) 70 - 99 mg/dL    Micro Results: No results found for this or any previous visit (from the past 240 hour(s)).  Medications:  Scheduled Meds:   . bisacodyl  10 mg Rectal Daily  . enoxaparin (LOVENOX) injection  30 mg Subcutaneous Q24H  . fentaNYL  50 mcg Transdermal Q72H  . insulin aspart  0-9 Units Subcutaneous Q8H  . lip balm  1 application Topical BID  . metoprolol  2.5 mg Intravenous Q6H  . olopatadine  1 drop Both Eyes BID  . pantoprazole sodium  40 mg Oral BID  . sodium chloride  10-40 mL Intracatheter Q12H  . sodium phosphate  Dextrose 5% IVPB  20 mmol Intravenous Once  . tiotropium  18 mcg Inhalation Daily  . DISCONTD: chlorhexidine  15 mL Mouth Rinse BID    . DISCONTD: pantoprazole (PROTONIX) IV  40 mg Intravenous Q12H   Continuous Infusions:   . TPN (CLINIMIX) +/- additives 60 mL/hr at 12/15/11 1800  . DISCONTD: TPN (CLINIMIX) +/- additives 60 mL/hr at 12/16/11 1735  . DISCONTD: TPN (CLINIMIX) +/- additives     PRN Meds:.acetaminophen, albuterol, diphenhydrAMINE, fentaNYL, magic mouthwash, menthol-cetylpyridinium, nitroGLYCERIN, ondansetron (ZOFRAN) IV, ondansetron, phenol, promethazine, pseudoephedrine, sodium chloride, zolpidem  Assessment/Plan: UTI - completed cipro   Recurrent High grade SBO - some clinical improvement noted, mgmt per [facility],  - tolerated full liquid diet, surgery advancing to soft diet, if tolerates then home in AM   Severe Protein Calorie Malnutrition  - Pt had been on TNA and now on full liquids   Hypocalcemia/low Vit D  -give vitamin D 50,000 IU today - start calcium Vit D supplement   Hyponatremia  - following sodium level, slightly improved  CAD/ AF/ Cardiomyopathy/ CHF/PTVP  - currently compensated, stable   COPD O2 Dependant, - continue oxygen support   Chronic pain syndrome/ chronic abdominal pain/fibromyalgia on multiple pain meds at home.   Chronic Constipation - laxatives as needed, pt starting to have BMs now   Hx of gastroparesis  Hx Gi Bleed  Restless leg  Hypertension - stable, controlled   LOS: 6 days   Clanford Johnson 12/17/2011, 3:12 PM  Cleora Fleet, MD, CDE, FAAFP [facility] [place], [place]  [phone]

## 2011-12-17 NOTE — Progress Notes (Signed)
  Subjective: Tolerating full liquid diet  Objective: Vital signs in last 24 hours: Temp:  [97.7 F (36.5 C)-97.9 F (36.6 C)] 97.9 F (36.6 C) (09/30 0454) Pulse Rate:  [86-94] 86  (09/30 0454) Resp:  [16] 16  (09/30 0454) BP: (116-136)/(53-69) 116/54 mmHg (09/30 0454) SpO2:  [98 %-100 %] 100 % (09/30 0454) Last BM Date: 12/15/11  Intake/Output from previous day: 09/29 0701 - 09/30 0700 In: 2100 [P.O.:600; TPN:1500] Out: 1300 [Urine:1300] Intake/Output this shift:    GI: soft flat non tender good BS  Lab Results:   [facility] 12/17/11 0705  WBC 6.8  HGB 11.2*  HCT 33.5*  PLT 236   BMET  Basename 12/17/11 0705 12/16/11 1247  NA 132* 130*  K 5.0 4.8  CL 101 102  CO2 25 22  GLUCOSE 121* 106*  BUN 14 14  CREATININE 0.49* 0.40*  CALCIUM 6.9* 6.8*   PT/INR No results found for this basename: LABPROT:2,INR:2 in the last 72 hours ABG No results found for this basename: PHART:2,PCO2:2,PO2:2,HCO3:2 in the last 72 hours  Studies/Results: No results found.  Anti-infectives: Anti-infectives     Start     Dose/Rate Route Frequency Ordered Stop   12/11/11 2345   ciprofloxacin (CIPRO) IVPB 400 mg  Status:  Discontinued        400 mg 200 mL/hr over 60 Minutes Intravenous 2 times daily 12/11/11 2339 12/16/11 1148          Assessment/Plan: pSBO  Resolving  Soft diet.  Can stop TNA today.  Can go home from our standpoint if she tolerates diet and should stay on soft diet for 2 weeks.  LOS: 6 days    Anik Wesch A. 12/17/2011

## 2011-12-17 NOTE — Progress Notes (Signed)
Pharmacy: IV to PO Protonix  Patient has been receiving IV Protonix. Per Pharmacy and Therapeutics Committee, this patient meets criteria for auto conversion to PO Protonix:   Tolerating a diet of full liquids or better  Tolerating other medications via enteral route  No GIB  Matilynn Dacey PharmD  [phone] 04/16/2011 8:54 AM    

## 2011-12-18 ENCOUNTER — Ambulatory Visit: Payer: Medicare Other | Admitting: Internal Medicine

## 2011-12-18 LAB — GLUCOSE, CAPILLARY
Glucose-Capillary: 81 mg/dL (ref 70–99)
Glucose-Capillary: 91 mg/dL (ref 70–99)

## 2011-12-18 MED ORDER — VITAMIN D (ERGOCALCIFEROL) 1.25 MG (50000 UNIT) PO CAPS: 50000.0000 [IU] | ORAL_CAPSULE | ORAL | Status: DC

## 2011-12-18 MED ORDER — ONDANSETRON HCL 4 MG PO TABS: 4.0000 mg | ORAL_TABLET | Freq: Three times a day (TID) | ORAL | Status: DC | PRN

## 2011-12-18 MED ORDER — PROMETHAZINE HCL 25 MG PO TABS: 25.0000 mg | ORAL_TABLET | Freq: Three times a day (TID) | ORAL | Status: DC | PRN

## 2011-12-18 MED ORDER — CALCIUM CARBONATE-VITAMIN D 500-200 MG-UNIT PO TABS: 1.0000 | ORAL_TABLET | Freq: Two times a day (BID) | ORAL | Status: DC

## 2011-12-18 MED ORDER — PANTOPRAZOLE SODIUM 40 MG PO TBEC
40.0000 mg | DELAYED_RELEASE_TABLET | Freq: Two times a day (BID) | ORAL | Status: DC
  Administered 2011-12-18: 40 mg via ORAL
  Filled 2011-12-18: qty 1

## 2011-12-18 NOTE — Discharge Summary (Signed)
Physician Discharge Summary  Patient ID: [name] MRN: [id number] DOB/AGE: 25-Nov-1945 66 y.o.  Admit date: 12/11/2011 Discharge date: 12/18/2011  Discharge Diagnoses:    *Protein-calorie malnutrition, moderate  Atrial fibrillation  Atrial flutter  SYSTOLIC HEART FAILURE, CHRONIC  COPD  GERD  Chronic hypoxemic respiratory failure  UTI (urinary tract infection)  Adhesions of intestine, very dense, with frozen abdomen  SBO (small bowel obstruction), recurrent  Hypocalcemia  Vitamin d deficiency  Hypokalemia  Recommendations for outpatient follow up:  Please check calcium/ vit D, Pt has severe vit D deficiency, started on Vit D 50,000 IU once weekly, follow BMP, CBC  Discharged Condition: good  Hospital Course: From H&P:  [name] is a 66 y.o. female, with past medical history significant for congestive heart failure or multiple episodes of small bowel obstruction and multiple surgeries for lysis of adhesions presenting with one month history of abdominal pain nausea vomiting on and off today it became much worse she could not comment the times that she had vomiting denies any diarrhea no fever or chills. Patient denies any chest pains shortness of breath burning on urination. HPI  Pleasant female with numerous prior abdominal surgeries Including hysterectomy, appendectomy, and partial colectomy. History of small bowel obstructions in the past. Had to undergo extensive lyses of adhesions for over four hours and 2012. He denied could not be completed secondary to dense adhesions and poor tissue quality. Eventually improved. She has chronic intermittent distention and nausea. Chronic pain. Patient notes worsening abdominal pain and distention over past 4 days PTA pain tended to be in the right lower side. She began to have emesis. Still a little flatus. Last bowel movement a few days ago. Based on concerns, she came emergency room. Workup and CT scan very suspicious for bowel  obstruction. Urinalysis pushes for her urinary tract infection as well. We are asked to be involved. Has numerous health issues including his numerous pulmonary and cardiac issues. On chronic antiarrhythmic medications.  UTI - completed cipro IV  Recurrent High grade SBO - some clinical improvement noted, mgmt per [facility], - Pt received TNA for nutrition for several days until she could start diet.  She was started on clears, diet advanced to soft and tolerated well with normal BMs.   - Pt should continue soft diet   Severe Protein Calorie Malnutrition  - Pt resuming soft diet and tolerating well  Hypocalcemia/low Vit D  -gave vitamin D 50,000 IU on 9/30  - start calcium Vit D supplement bid - follow up with PCP  Hyponatremia  - following sodium level, improved   CAD/ AF/ Cardiomyopathy/ CHF/PTVP  - currently compensated, stable   COPD O2 Dependant, - continue oxygen support   Chronic pain syndrome/ chronic abdominal pain/fibromyalgia on multiple pain meds at home.   Chronic Constipation - laxatives as needed, pt starting to have BMs prior to discharge  Hx of gastroparesis  Hx Gi Bleed  Restless leg   Hypertension - stable, controlled   Consults: general surgery  Discharge Exam:Pt tolerated soft diet well and having BMs.  Pt tolerating oral tablets well. Asking to go home. Blood pressure 143/72, pulse 80, temperature 97.5 F (36.4 C), temperature source Oral, resp. rate 18, height 5\' 2"  (1.575 m), weight 42.8 kg (94 lb 5.7 oz), SpO2 99.00%. General - awake, cachectic, no distress, cooperative  HEENT - NCAT, MMM Lungs - BBS, CTA  CV - normal s1, s2 sounds  Abd - soft, nondistended, BS heard, no guarding  Ext -  no C/C/E  Disposition: 06-Home-Health Care Svc  Discharge Orders    Future Appointments: Provider: Department: Dept Phone: Center:   01/09/2012 9:15 AM Duke Salvia, MD [facility] [phone] [facility]     Future Orders Please Complete By  Expires   Increase activity slowly      Discharge instructions      Comments:   Please continue soft diet for the next 2 weeks.  Seek medical care or return if symptoms return, worsen or don't improve       Medication List     As of 12/18/2011 11:22 AM    TAKE these medications         albuterol 108 (90 BASE) MCG/ACT inhaler   Commonly known as: PROVENTIL HFA;VENTOLIN HFA   Inhale 2 puffs into the lungs every 6 (six) hours as needed for wheezing.      amiodarone 200 MG tablet   Commonly known as: PACERONE   TAKE 1 TABLET BY MOUTH EVERY MORNING      azelastine 0.05 % ophthalmic solution   Commonly known as: OPTIVAR   Place 1 drop into both eyes 2 (two) times daily.      calcium-vitamin D 500-200 MG-UNIT per tablet   Commonly known as: OSCAL WITH D   Take 1 tablet by mouth 2 (two) times daily.      carvedilol 6.25 MG tablet   Commonly known as: COREG   TAKE 1 TABLET BY MOUTH TWICE DAILY WITH A MEAL      cyclobenzaprine 10 MG tablet   Commonly known as: FLEXERIL   TAKE 1 TABLET BY MOUTH THREE TIMES DAILY AS NEEDED FOR MUSCLE SPASMS      etodolac 400 MG tablet   Commonly known as: LODINE   Take 1 tablet by mouth 2 (two) times daily.      fentaNYL 50 MCG/HR   Commonly known as: DURAGESIC - dosed mcg/hr   Place 1 patch (50 mcg total) onto the skin every 3 (three) days.      furosemide 20 MG tablet   Commonly known as: LASIX   Take 1 tablet (20 mg total) by mouth every morning.      gabapentin 600 MG tablet   Commonly known as: NEURONTIN      nitroGLYCERIN 0.4 MG SL tablet   Commonly known as: NITROSTAT   Place 1 tablet (0.4 mg total) under the tongue every 5 (five) minutes as needed for chest pain. CHEST PAIN        ondansetron 4 MG tablet   Commonly known as: ZOFRAN   Take 1 tablet (4 mg total) by mouth every 8 (eight) hours as needed for nausea.      oxyCODONE-acetaminophen 10-325 MG per tablet   Commonly known as: PERCOCET   Take 1 tablet by mouth every 4  (four) hours as needed. PAIN      pantoprazole 40 MG tablet   Commonly known as: PROTONIX   Take 1 tablet (40 mg total) by mouth daily.      PROLIA 60 MG/ML Soln injection   Generic drug: denosumab   Inject 60 mg into the skin every 6 (six) months. Twice a year      promethazine 25 MG tablet   Commonly known as: PHENERGAN   Take 1 tablet (25 mg total) by mouth every 8 (eight) hours as needed for nausea.      temazepam 15 MG capsule   Commonly known as: RESTORIL   TAKE 1-2 CAPSULES  BY MOUTH EVERY NIGHT AT BEDTIME AS NEEDED FOR SLEEP      tiotropium 18 MCG inhalation capsule   Commonly known as: SPIRIVA   Place 1 capsule (18 mcg total) into inhaler and inhale every morning.      Vitamin D (Ergocalciferol) 50000 UNITS Caps   Commonly known as: DRISDOL   Take 1 capsule (50,000 Units total) by mouth every 7 (seven) days.           Follow-up Information    Follow up with Loreen Freud, DO. Schedule an appointment as soon as possible for a visit in 1 week. (hospital follow up)    Contact information:   [address] [facility] [address] [place] [address] [phone]       Follow up with [name], MD. Schedule an appointment as soon as possible for a visit in 3 weeks. (hospital follow up)    Contact information:   [address] [place] [address] [phone]         I spent 33 mins preparing discharge, reconciling meds, writing prescriptions, reviewing labs, xrays, tests, consult notes, etc.  Signed: Standley Dakins MD [facility] [place], [place] 12/18/2011, 11:22 AM

## 2011-12-18 NOTE — Progress Notes (Signed)
  Subjective: Feels great mult BM tol diet  Objective: Vital signs in last 24 hours: Temp:  [97.8 F (36.6 C)-98.1 F (36.7 C)] 97.8 F (36.6 C) (10/01 0549) Pulse Rate:  [76-98] 76  (10/01 0549) Resp:  [16-20] 16  (10/01 0549) BP: (121-141)/(53-91) 121/84 mmHg (10/01 0549) SpO2:  [98 %-100 %] 98 % (10/01 0819) Last BM Date: 12/18/11  Intake/Output from previous day: 09/30 0701 - 10/01 0700 In: 1235 [P.O.:780; TPN:455] Out: 350 [Urine:350] Intake/Output this shift: Total I/O In: 240 [P.O.:240] Out: -   GI: soft non tender no distension  Lab Results:   [name] 12/17/11 0705  WBC 6.8  HGB 11.2*  HCT 33.5*  PLT 236   BMET  Basename 12/17/11 0705 12/16/11 1247  NA 132* 130*  K 5.0 4.8  CL 101 102  CO2 25 22  GLUCOSE 121* 106*  BUN 14 14  CREATININE 0.49* 0.40*  CALCIUM 6.9* 6.8*   PT/INR No results found for this basename: LABPROT:2,INR:2 in the last 72 hours ABG No results found for this basename: PHART:2,PCO2:2,PO2:2,HCO3:2 in the last 72 hours  Studies/Results: No results found.  Anti-infectives: Anti-infectives     Start     Dose/Rate Route Frequency Ordered Stop   12/11/11 2345   ciprofloxacin (CIPRO) IVPB 400 mg  Status:  Discontinued        400 mg 200 mL/hr over 60 Minutes Intravenous 2 times daily 12/11/11 2339 12/16/11 1148          Assessment/Plan: Resolved pSBO OK to discharge  No follow up needed  LOS: 7 days    Cleopha Indelicato A. 12/18/2011

## 2011-12-18 NOTE — Progress Notes (Signed)
Pt discharged to home with husband. Pt. Is alert and oriented. Pt is hemodynamically stable. AVS reviewed with pt. Capable of re verbalizing medication regimen. Discharge plan appropriate and in place. 

## 2011-12-19 ENCOUNTER — Telehealth: Payer: Self-pay | Admitting: Internal Medicine

## 2011-12-19 ENCOUNTER — Other Ambulatory Visit: Payer: Self-pay | Admitting: Family Medicine

## 2011-12-19 NOTE — Telephone Encounter (Signed)
Spoke with patient and scheduled her OV on 12/31/11 at 11:00 AM for post hospital f/u

## 2011-12-19 NOTE — Telephone Encounter (Signed)
Last seen 10/15/11 and filled 11/15/11 . Please advise     KP

## 2011-12-24 ENCOUNTER — Other Ambulatory Visit: Payer: Self-pay | Admitting: Family Medicine

## 2011-12-24 MED ORDER — GABAPENTIN 600 MG PO TABS: 600.0000 mg | ORAL_TABLET | Freq: Four times a day (QID) | ORAL | Status: DC

## 2011-12-24 NOTE — Telephone Encounter (Signed)
refill gabapentin 600mg  tablets #120, take one tablet by mouth four times daily, last fill 8.29.13, last ov 7.29.13 Meds MGMT

## 2011-12-26 ENCOUNTER — Other Ambulatory Visit: Payer: Self-pay | Admitting: Cardiology

## 2011-12-26 NOTE — Telephone Encounter (Signed)
..   Requested Prescriptions   Pending Prescriptions Disp Refills  . furosemide (LASIX) 20 MG tablet [Pharmacy Med Name: FUROSEMIDE 20MG  TABLETS] 30 tablet 5    Sig: TAKE 1 TABLET BY MOUTH EVERY MORNING

## 2011-12-27 IMAGING — CR DG CHEST 2V
2 series · 2 of 2 positions shown · non-contrast
Comparison: 01/08/2011

CLINICAL DATA: Preop interstitial and planned revision,
hypertension, previous tobacco use

CHEST - 2 VIEW

[w chest pa]
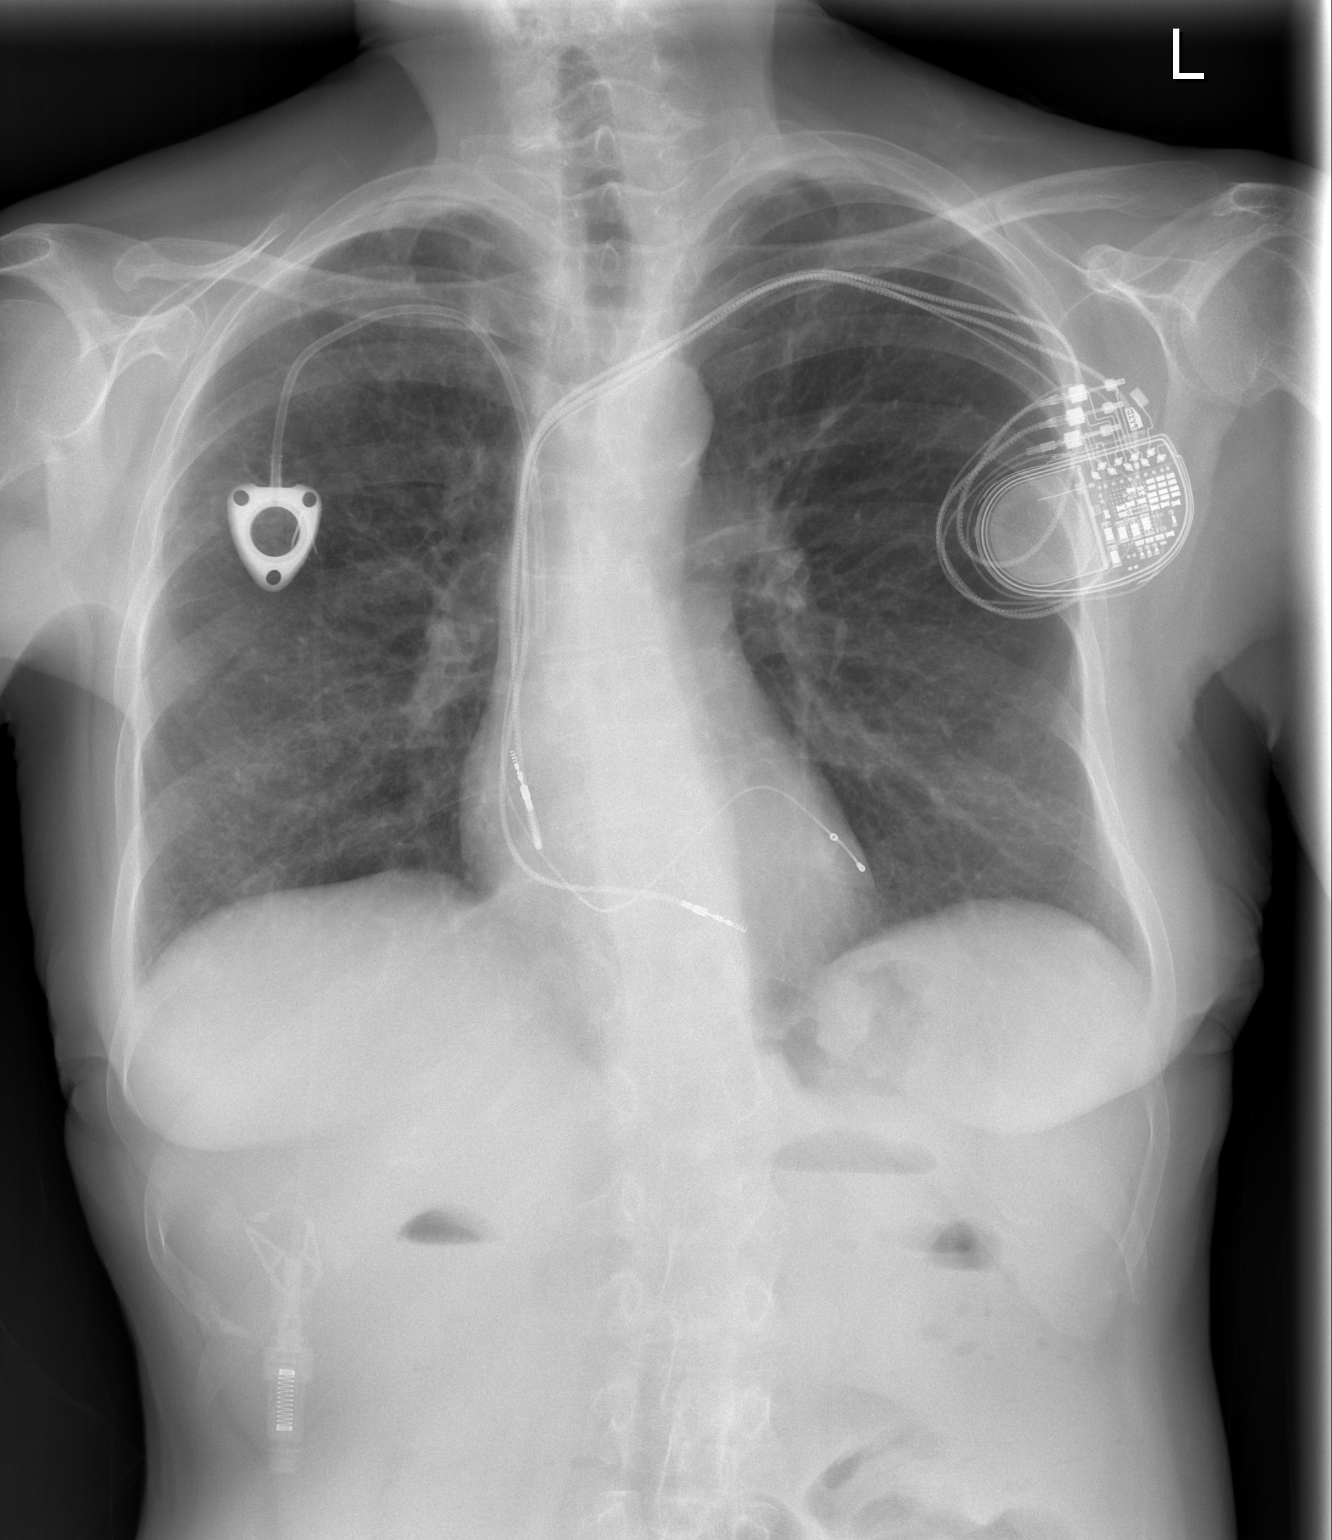

[w chest lat]
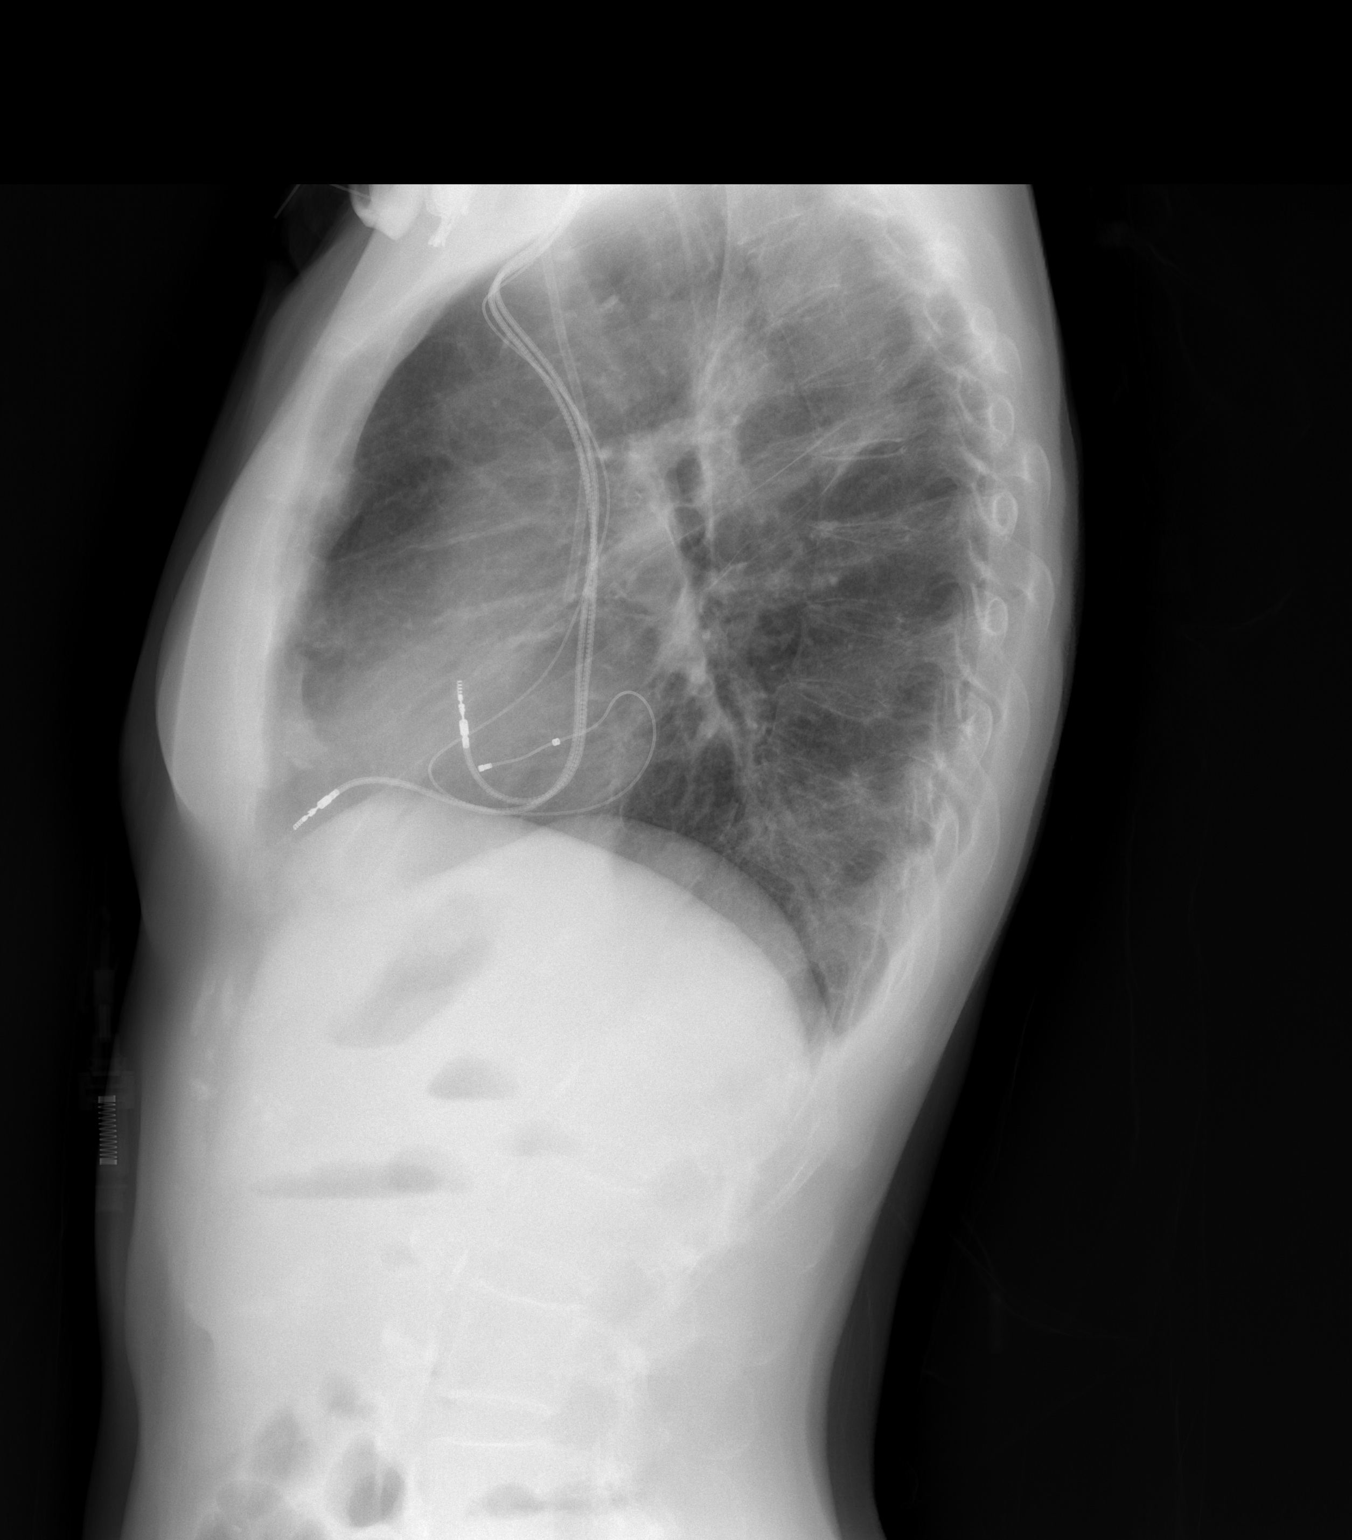

[2 of 2 positions shown; findings below may reference images not displayed]

FINDINGS: Right subclavian port catheter and   left subclavian
pacemaker are in stable position.  Heart size normal.  Lungs are
clear.  Atheromatous aortic arch.  No effusion. Stable mild
thoracic dextroscoliosis.
IMPRESSION: 1.  Stable postoperative changes.  No acute disease.

## 2011-12-31 ENCOUNTER — Encounter: Payer: Self-pay | Admitting: Physician Assistant

## 2011-12-31 ENCOUNTER — Ambulatory Visit (INDEPENDENT_AMBULATORY_CARE_PROVIDER_SITE_OTHER): Payer: Medicare Other | Admitting: Physician Assistant

## 2011-12-31 VITALS — BP 112/70 | HR 80 | Ht 61.0 in | Wt 96.0 lb

## 2011-12-31 DIAGNOSIS — Z8719 Personal history of other diseases of the digestive system: Secondary | ICD-10-CM

## 2011-12-31 DIAGNOSIS — E538 Deficiency of other specified B group vitamins: Secondary | ICD-10-CM

## 2011-12-31 MED ORDER — CYANOCOBALAMIN 1000 MCG/ML IJ SOLN
1000.0000 ug | Freq: Once | INTRAMUSCULAR | Status: AC
  Administered 2011-12-31: 1000 ug via INTRAMUSCULAR

## 2011-12-31 NOTE — Progress Notes (Signed)
Subjective:    Patient ID: [name], female    DOB: 24-Feb-1946, 66 y.o.   MRN: [id number]  HPI [name] is a very nice 66 year old female known to Dr. [name] with multiple medical problems including history of atrial fib/flutter, congestive heart failure, dilated cardiomyopathy, GERD, O2 dependent COPD, chronic pain syndrome, fibromyalgia. She has history of remote subtotal colectomy and subsequent development of an anastomotic ileocolic stricture. She is also known to have dense abdominal adhesive disease. She has history of recurrent small bowel obstructions. She had attempt at lysis of adhesions a couple of years ago. Patient comes in today for post hospital followup. She was hospitalized 924 through 12/18/2011 with a recurrent small bowel obstruction. She did require short-term nutritional support with TNA while in the hospital. She hasn't passed required home TPN-She had been on TPN earlier this year and it was discontinued in April. She had been maintaining her weight fairly well until this recent obstruction and drop several pounds. Patient says she actually had 3 episodes in September in the last episode went to the hospital. Since discharge she says she's doing well has had some vague abdominal queasiness which is relieved with antibiotics but no vomiting. Her appetite has improved and she feels that she's eating pretty well and has actually gained about 5 pounds. She's also using resource supplements. She has periodic diarrhea which is normal for her no current complaints of abdominal pain.    Review of Systems  Constitutional: Negative.   HENT: Negative.   Eyes: Negative.   Respiratory: Positive for shortness of breath.   Cardiovascular: Negative.   Gastrointestinal: Positive for nausea and diarrhea.  Genitourinary: Negative.   Musculoskeletal: Negative.   Neurological: Negative.   Hematological: Negative.   Psychiatric/Behavioral: Negative.    Outpatient Prescriptions  Prior to Visit  Medication Sig Dispense Refill  . albuterol (PROAIR HFA) 108 (90 BASE) MCG/ACT inhaler Inhale 2 puffs into the lungs every 6 (six) hours as needed for wheezing.  1 Inhaler  5  . azelastine (OPTIVAR) 0.05 % ophthalmic solution Place 1 drop into both eyes 2 (two) times daily.  6 mL  12  . calcium-vitamin D (OSCAL WITH D) 500-200 MG-UNIT per tablet Take 1 tablet by mouth 2 (two) times daily.  60 tablet  0  . carvedilol (COREG) 6.25 MG tablet TAKE 1 TABLET BY MOUTH TWICE DAILY WITH A MEAL  60 tablet  3  . cyclobenzaprine (FLEXERIL) 10 MG tablet TAKE 1 TABLET BY MOUTH THREE TIMES DAILY AS NEEDED FOR MUSCLE SPASMS  60 tablet  0  . denosumab (PROLIA) 60 MG/ML SOLN Inject 60 mg into the skin every 6 (six) months. Twice a year      . etodolac (LODINE) 400 MG tablet Take 1 tablet by mouth 2 (two) times daily.      . fentaNYL (DURAGESIC - DOSED MCG/HR) 50 MCG/HR Place 1 patch (50 mcg total) onto the skin every 3 (three) days.  10 patch  0  . furosemide (LASIX) 20 MG tablet TAKE 1 TABLET BY MOUTH EVERY MORNING  30 tablet  5  . gabapentin (NEURONTIN) 600 MG tablet Take 1 tablet (600 mg total) by mouth 4 (four) times daily.  120 tablet  2  . nitroGLYCERIN (NITROSTAT) 0.4 MG SL tablet Place 1 tablet (0.4 mg total) under the tongue every 5 (five) minutes as needed for chest pain. CHEST PAIN   25 tablet  11  . ondansetron (ZOFRAN) 4 MG tablet Take 1 tablet (4 mg  total) by mouth every 8 (eight) hours as needed for nausea.  20 tablet  0  . oxyCODONE-acetaminophen (PERCOCET) 10-325 MG per tablet Take 1 tablet by mouth every 4 (four) hours as needed. PAIN  120 tablet  0  . pantoprazole (PROTONIX) 40 MG tablet Take 1 tablet (40 mg total) by mouth daily.  30 tablet  3  . promethazine (PHENERGAN) 25 MG tablet Take 1 tablet (25 mg total) by mouth every 8 (eight) hours as needed for nausea.  40 tablet  0  . temazepam (RESTORIL) 15 MG capsule TAKE 1-2 CAPSULES BY MOUTH EVERY NIGHT AT BEDTIME AS NEEDED FOR  SLEEP  60 capsule  0  . tiotropium (SPIRIVA HANDIHALER) 18 MCG inhalation capsule Place 1 capsule (18 mcg total) into inhaler and inhale every morning.  30 capsule  5  . Vitamin D, Ergocalciferol, (DRISDOL) 50000 UNITS CAPS Take 1 capsule (50,000 Units total) by mouth every 7 (seven) days.  8 capsule  0  . amiodarone (PACERONE) 200 MG tablet TAKE 1 TABLET BY MOUTH EVERY MORNING  30 tablet  5   Facility-Administered Medications Prior to Visit  Medication Dose Route Frequency Provider Last Rate Last Dose  . cyanocobalamin ((VITAMIN B-12)) injection 1,000 mcg  1,000 mcg Intramuscular Q30 days Hart Carwin, MD   1,000 mcg at 11/12/11 1109   Allergies  Allergen Reactions  . Dabigatran Etexilate Mesylate Other (See Comments)    INTERNAL BLEEDING-pradaxa  . Talwin (Pentazocine) Other (See Comments)    hallucinations  . Pentazocine Lactate Other (See Comments)    HALLUCINATIONS  . Sulfonamide Derivatives Other (See Comments)    JITTERINESS    Patient Active Problem List  Diagnosis  . DEHYDRATION  . ANEMIA-IRON DEFICIENCY  . RESTLESS LEG SYNDROME  . Chronic pain syndrome  . HYPERTENSION  . CARDIOMYOPATHY, PRIMARY, DILATED  . LBBB  . Atrial fibrillation  . Atrial flutter  . SYSTOLIC HEART FAILURE, CHRONIC  . COPD  . GERD  . GASTRITIS  . GASTROPARESIS  . GASTROENTERITIS  . DIVERTICULOSIS, COLON  . OSTEOARTHRITIS  . DEGENERATIVE DISC DISEASE  . FIBROMYALGIA  . OSTEOPOROSIS  . SYNCOPE  . CHRONIC FATIGUE SYNDROME  . NAUSEA, CHRONIC  . URINARY INCONTINENCE  . ABDOMINAL PAIN, CHRONIC  . NONSPECIFIC ABN FINDNG RAD&OTH EXAM BILARY TRCT  . Chronic hypoxemic respiratory failure  . DYSURIA  . CRT- [employer]  . Deep venous thrombosis Left subclavian vein  . Recurrent pneumonia  . Anemia  . Bilateral leg weakness  . B12 deficiency  . Degenerative arthritis of hip  . UTI (urinary tract infection)  . Allergic conjunctivitis  . Adhesions of intestine, very dense, with frozen  abdomen  . SBO (small bowel obstruction), recurrent  . Protein-calorie malnutrition, moderate  . Oxygen dependent  . Hx of gastric ulcer  . Hypocalcemia  . Vitamin D deficiency  . Hypokalemia   History   Social History  . Marital Status: Married    Spouse Name: N/A    Number of Children: 5  . Years of Education: N/A   Occupational History  . disabled    Social History Main Topics  . Smoking status: Former Smoker -- 1.0 packs/day for 20 years    Quit date: 03/19/1986  . Smokeless tobacco: Never Used  . Alcohol Use: No  . Drug Use: No  . Sexually Active: Not on file   Other Topics Concern  . Not on file   Social History Narrative   Occupation: DisabledAlcohol Use - noIllicit  Drug Use - noPatient is a former smoker, quit 1988 x39yrs, <1ppd. Daily Caffeine Use       Objective:   Physical Exam well-developed frail chronically ill-appearing white female in no acute distress, quite pleasant, using portable oxygen. Blood pressure 112/70 pulse 80 height 5 foot 1 weight 96. HEENT; nontraumatic normocephalic EOMI PERRLA sclera anicteric, Neck; supple no JVD, Cardiovascular; regular rate and rhythm with S1-S2 soft systolic murmur, Pulmonary; clear with decreased breath sounds bilaterally, Abdomen; soft mild tenderness bilateral lower clot or audible incisional scars bowel sounds are active no palpable mass or hepatosplenomegaly, Rectal; not done, Extremities; no clubbing cyanosis or edema skin warm and dry, Psych; mood and affect appropriate.        Assessment & Plan:  #10 66 year old female with multiple comorbidities seen in post hospital followup after recent admission for small bowel obstruction which resolved with conservative management. She is doing well over the past couple of weeks, has been gaining weight and is eating without difficulty. #2 history of dense abdominal adhesive disease, and known anastomotic ileocolic stricture #3 history of malnutrition requiring TPN-she's  been off of nutritional support since April has been maintaining her weight well #4 O2 dependent COPD #5 dilated cardiomyopathy #6 chronic pain syndrome #7 fibromyalgia #8 atrial fib/flutter #9 history of iron deficiency anemia-patient did receive iron dextran in June 2013-last hemoglobin was 11.2 in September 2013  Plan; patient is currently stable from a GI standpoint B12 injection today and then q. monthly as per her usual regimen Zofran4 mg 6 hours as needed for nausea Plan followup with Dr. [name] in 4-6 weeks

## 2011-12-31 NOTE — Patient Instructions (Addendum)
You were given a B12 injection today   You were scheduled for a follow up appointment with Dr. Juanda Chance for 02-01-2012 at 3pm

## 2011-12-31 NOTE — Progress Notes (Signed)
Agree with Ms. Esterwood's assessment and plan. Kirstan Fentress E. Jerrianne Hartin, MD, FACG   

## 2012-01-02 ENCOUNTER — Other Ambulatory Visit: Payer: Self-pay

## 2012-01-02 DIAGNOSIS — R52 Pain, unspecified: Secondary | ICD-10-CM

## 2012-01-02 NOTE — Telephone Encounter (Signed)
OV 12/31/11. Last filled oxy 11/23/11 #120 no refills, pain patch 11/23/11 #10 no refills.   PLz advise   MW

## 2012-01-04 ENCOUNTER — Telehealth: Payer: Self-pay

## 2012-01-04 MED ORDER — FENTANYL 50 MCG/HR TD PT72: 1.0000 | MEDICATED_PATCH | TRANSDERMAL | Status: DC

## 2012-01-04 MED ORDER — OXYCODONE-ACETAMINOPHEN 10-325 MG PO TABS: 1.0000 | ORAL_TABLET | ORAL | Status: DC | PRN

## 2012-01-04 NOTE — Telephone Encounter (Signed)
duplicate

## 2012-01-04 NOTE — Telephone Encounter (Signed)
Pt called in checking on 2 Rx I advise pt Rx was ready for pick up from our office.   MW

## 2012-01-04 NOTE — Telephone Encounter (Signed)
Patient requests a refill on pain medication, chart reviewed, recently admitted to hospital w/  SBO. I am concerned about prescribing pain meds in this setting however she was discharged from the hospital on pain medications. Plan: Prescribe one-month supply of Duragesic Prescribed 15 days of oxycodone Further refills per PCP

## 2012-01-09 ENCOUNTER — Encounter: Payer: Self-pay | Admitting: Internal Medicine

## 2012-01-09 ENCOUNTER — Ambulatory Visit (INDEPENDENT_AMBULATORY_CARE_PROVIDER_SITE_OTHER): Payer: Medicare Other | Admitting: Internal Medicine

## 2012-01-09 ENCOUNTER — Telehealth: Payer: Self-pay | Admitting: Internal Medicine

## 2012-01-09 VITALS — BP 110/76 | HR 81 | Ht 61.0 in | Wt 100.0 lb

## 2012-01-09 DIAGNOSIS — I447 Left bundle-branch block, unspecified: Secondary | ICD-10-CM

## 2012-01-09 DIAGNOSIS — R55 Syncope and collapse: Secondary | ICD-10-CM

## 2012-01-09 DIAGNOSIS — I4892 Unspecified atrial flutter: Secondary | ICD-10-CM

## 2012-01-09 DIAGNOSIS — I509 Heart failure, unspecified: Secondary | ICD-10-CM

## 2012-01-09 DIAGNOSIS — Z950 Presence of cardiac pacemaker: Secondary | ICD-10-CM

## 2012-01-09 DIAGNOSIS — I4891 Unspecified atrial fibrillation: Secondary | ICD-10-CM

## 2012-01-09 DIAGNOSIS — I428 Other cardiomyopathies: Secondary | ICD-10-CM

## 2012-01-09 DIAGNOSIS — I5022 Chronic systolic (congestive) heart failure: Secondary | ICD-10-CM

## 2012-01-09 LAB — PACEMAKER DEVICE OBSERVATION
AL AMPLITUDE: 0.9 mv
AL IMPEDENCE PM: 342 Ohm
AL THRESHOLD: 0.625 v
ATRIAL PACING PM: 0.01
BAMS-0001: 170 {beats}/min
BATTERY VOLTAGE: 3.0042 v
RV LEAD AMPLITUDE: 7.4 mv
RV LEAD IMPEDENCE PM: 494 Ohm
RV LEAD THRESHOLD: 0.625 v
RV LEAD THRESHOLD: 1 v
VENTRICULAR PACING PM: 99.96

## 2012-01-09 NOTE — Patient Instructions (Signed)
Your physician wants you to follow-up in: 1 year with Dr Klein.  You will receive a reminder letter in the mail two months in advance. If you don't receive a letter, please call our office to schedule the follow-up appointment.  

## 2012-01-09 NOTE — Assessment & Plan Note (Signed)
Device was reprogrammed to minimize diaphragmatic stimulation

## 2012-01-09 NOTE — Telephone Encounter (Signed)
Pt seen today by Graciela Husbands,   Please send clearance for surgery to [facility] Urology Dr. Jacquelyne Balint for upcoming surgery.  Patient did not have a number to reach this doctor.

## 2012-01-09 NOTE — Progress Notes (Signed)
Patient Care Team: Lelon Perla, DO as PCP - General Emelia Loron, MD as Consulting Physician (General Surgery) Hart Carwin, MD as Consulting Physician (Gastroenterology)   HPI  [name] is a 66 y.o. female seen in followup for congestive heart failure in the setting of nonischemic cardiomyopathy. She underwent CRT-P implantation October 2011. This has been associated with significant improvement although complicated by left upper extremity deep venous thrombosis.   She also has a history of atrial arrhythmias for which she was treated with amiodarone. LFTs normal In aug; Also O2 dependent COPD, chronic pain and malnutrition  She is doing relatively well. She continues to have occasional chest pains. She had one the other day took nitroglycerin got up a couple moments later and went to the kitchen and passed out. She's also had diaphragmatic stimulation and thumping which has been going on for a couple of months. She does not recall a specific fall prior to that.    Past Medical History  Diagnosis Date  . GERD (gastroesophageal reflux disease)   . CAD (coronary artery disease)     mild disease per cath in 2011  . LBBB (left bundle branch block)   . Atrial fibrillation     on amiodarone  . Venous embolism and thrombosis of subclavian vein     after pacemaker insertion Oct 2011  . Pleural effusion      s/p right thoracentesis 03/09  . Small bowel obstruction   . Urinary incontinence      -INTERSTIM IMPLANT NOT FUNCTIONING PER PT  . RLS (restless legs syndrome)   . Primary dilated cardiomyopathy     EF 45 to 50% per echo in Jan 2012  . Vitamin B12 deficiency   . Gastroparesis   . Chronic nausea   . Chronic abdominal pain   . Chronic pain syndrome   . Pacemaker     CRT therapy; followed by Dr. Graciela Husbands  . H/O: GI bleed     from Pradaxa  . Oxygen dependent     2 liters via nasal cannula at all times  . CHF (congestive heart failure)   . Anemia   . Hx of stress  fx aug 2011    right hip   . Elbow fracture, left aug 2012  . Arthritis     right hip  . Hx of gastric ulcer   . Diverticulitis   . HTN (hypertension)     under control; has been on med. x "years"  . COPD (chronic obstructive pulmonary disease)     continuous O2  . Hx MRSA infection   . Pneumonia - 03/09, 12/09, 02/10, 06/10    MOST RECENT FEB 2013  . Elevated liver enzymes   . Blood transfusion   . Anginal pain   . Shortness of breath   . Fibromyalgia     COUPLE OF TIMES A YEAR-PT HAS EPISODES OF CONFUSION-USUALLY INVOLVES DAY/NIGHT REVERSAL AND EPISODES OF TWITCHING AND FALLS--SEES DR. Modesto Charon - NEUROLOGIST-LAST SEEN 07/10/11    Past Surgical History  Procedure Date  . Appendectomy   . Pacemaker placement 12/21/2009  . Peg removed     with complications  . Tonsillectomy age 48  . Interstim implant revision 03/06/2011    Procedure: REVISION OF Leane Platt;  Surgeon: Martina Sinner, MD;  Location: [facility];  Service: Urology;  Laterality: N/A;  Replacement of Neurostimulator  . Portacath placement 12/16/2009  . Total abdominal hysterectomy     complete  . Interstim implant  placement 05/28/2006 - stage I    06/05/2006 - stage II  . Interstim implant revision 10/23/2007  . Cystoscopy with injection 04/30/2006    transurethral collagen injection; incision vaginal stenosis  . Small intestine surgery 05/20/2001    ex. lap., resection of small bowel stricture; gastrostomy; insertion central line  . Cardiac catheterization 04/08/2006, 11/16/2009  . Cystoscopy 11/11/2008  . Cystoscopy w/ retrogrades 11/11/2008    right  . Colectomy     Intestinal resection (5times)  . Colectomy 02/04/2000    ex. lap., intra-abd. subtotal colectomy with ileosigmoid colon anastomosies and lysis of adhesions  . Gastrocutaneous fistula closure   . Exploratory laparotomy 04/27/2009    lysis of adhesions, gastrostomy tube  . Port-a-cath removal 07/27/2011    Procedure: REMOVAL PORT-A-CATH;  Surgeon: Emelia Loron, MD;  Location: [facility];  Service: General;  Laterality: N/A;  . Total hip arthroplasty 08/03/2011    Procedure: TOTAL HIP ARTHROPLASTY ANTERIOR APPROACH;  Surgeon: Kathryne Hitch, MD;  Location: [facility];  Service: Orthopedics;  Laterality: Right;    Current Outpatient Prescriptions  Medication Sig Dispense Refill  . albuterol (PROAIR HFA) 108 (90 BASE) MCG/ACT inhaler Inhale 2 puffs into the lungs every 6 (six) hours as needed for wheezing.  1 Inhaler  5  . azelastine (OPTIVAR) 0.05 % ophthalmic solution Place 1 drop into both eyes 2 (two) times daily.  6 mL  12  . calcium-vitamin D (OSCAL WITH D) 500-200 MG-UNIT per tablet Take 1 tablet by mouth 2 (two) times daily.  60 tablet  0  . carvedilol (COREG) 6.25 MG tablet TAKE 1 TABLET BY MOUTH TWICE DAILY WITH A MEAL  60 tablet  3  . cyclobenzaprine (FLEXERIL) 10 MG tablet TAKE 1 TABLET BY MOUTH THREE TIMES DAILY AS NEEDED FOR MUSCLE SPASMS  60 tablet  0  . denosumab (PROLIA) 60 MG/ML SOLN Inject 60 mg into the skin every 6 (six) months. Twice a year      . etodolac (LODINE) 400 MG tablet Take 1 tablet by mouth 2 (two) times daily.      . fentaNYL (DURAGESIC - DOSED MCG/HR) 50 MCG/HR Place 1 patch (50 mcg total) onto the skin every 3 (three) days.  10 patch  0  . furosemide (LASIX) 20 MG tablet TAKE 1 TABLET BY MOUTH EVERY MORNING  30 tablet  5  . gabapentin (NEURONTIN) 600 MG tablet Take 1 tablet (600 mg total) by mouth 4 (four) times daily.  120 tablet  2  . nitroGLYCERIN (NITROSTAT) 0.4 MG SL tablet Place 1 tablet (0.4 mg total) under the tongue every 5 (five) minutes as needed for chest pain. CHEST PAIN   25 tablet  11  . ondansetron (ZOFRAN) 4 MG tablet Take 1 tablet (4 mg total) by mouth every 8 (eight) hours as needed for nausea.  20 tablet  0  . oxyCODONE-acetaminophen (PERCOCET) 10-325 MG per tablet Take 1 tablet by mouth every 4 (four) hours as needed. PAIN  60 tablet  0  . pantoprazole (PROTONIX) 40 MG  tablet Take 1 tablet (40 mg total) by mouth daily.  30 tablet  3  . promethazine (PHENERGAN) 25 MG tablet Take 1 tablet (25 mg total) by mouth every 8 (eight) hours as needed for nausea.  40 tablet  0  . temazepam (RESTORIL) 15 MG capsule TAKE 1-2 CAPSULES BY MOUTH EVERY NIGHT AT BEDTIME AS NEEDED FOR SLEEP  60 capsule  0  . tiotropium (SPIRIVA HANDIHALER) 18 MCG  inhalation capsule Place 1 capsule (18 mcg total) into inhaler and inhale every morning.  30 capsule  5  . Vitamin D, Ergocalciferol, (DRISDOL) 50000 UNITS CAPS Take 1 capsule (50,000 Units total) by mouth every 7 (seven) days.  8 capsule  0  . DISCONTD: iron dextran complex (INFED) 50 MG/ML injection Please give infed infusion (no test dose needed) over 4 hours per pharmacy calculated dose. Ht: 5'2 Wt: 99 pounds Hgb: 12  1 mL  0   Current Facility-Administered Medications  Medication Dose Route Frequency Provider Last Rate Last Dose  . cyanocobalamin ((VITAMIN B-12)) injection 1,000 mcg  1,000 mcg Intramuscular Q30 days Hart Carwin, MD   1,000 mcg at 11/12/11 1109    Allergies  Allergen Reactions  . Dabigatran Etexilate Mesylate Other (See Comments)    INTERNAL BLEEDING-pradaxa  . Talwin (Pentazocine) Other (See Comments)    hallucinations  . Pentazocine Lactate Other (See Comments)    HALLUCINATIONS  . Sulfonamide Derivatives Other (See Comments)    JITTERINESS     Review of Systems negative except from HPI and PMH  Physical Exam Ht 5\' 1"  (1.549 m)  Wt 100 lb (45.36 kg)  BMI 18.89 kg/m2 Cachectic female in no acute distress wearing o2 HENT normal E scleral and icterus clear Neck Supple JVP flat; carotids brisk and full Clear to ausculation Regular rate and rhythm, no murmurs gallops or rub Soft with active bowel sounds No clubbing cyanosis 1+ Edema Alert and oriented, grossly normal motor and sensory function Skin Warm and Dry    Assessment and  Plan

## 2012-01-09 NOTE — Assessment & Plan Note (Signed)
She has had another episode of syncope in the context of nitroglycerin. I've advised her to sit for about 20 minutes prior to getting up

## 2012-01-09 NOTE — Assessment & Plan Note (Addendum)
She is euvolemic but her optivol index is increased. We will recheck this in about 2 weeks. We'll continue her current medications.

## 2012-01-09 NOTE — Assessment & Plan Note (Signed)
She has had INFREQUENT  epidose of atrial fibrillation. Her amiodarone has been discontinued because of pulmonary issues. She was concerned about that. We will follow the frequency from her device.

## 2012-01-10 IMAGING — CR DG CHEST 2V
2 series · 2 of 2 positions shown · non-contrast
Comparison: Chest x-ray of 02/28/2011

CLINICAL DATA: Cough for 3 days, former smoker

CHEST - 2 VIEW

[view not recorded (1 of 2)]
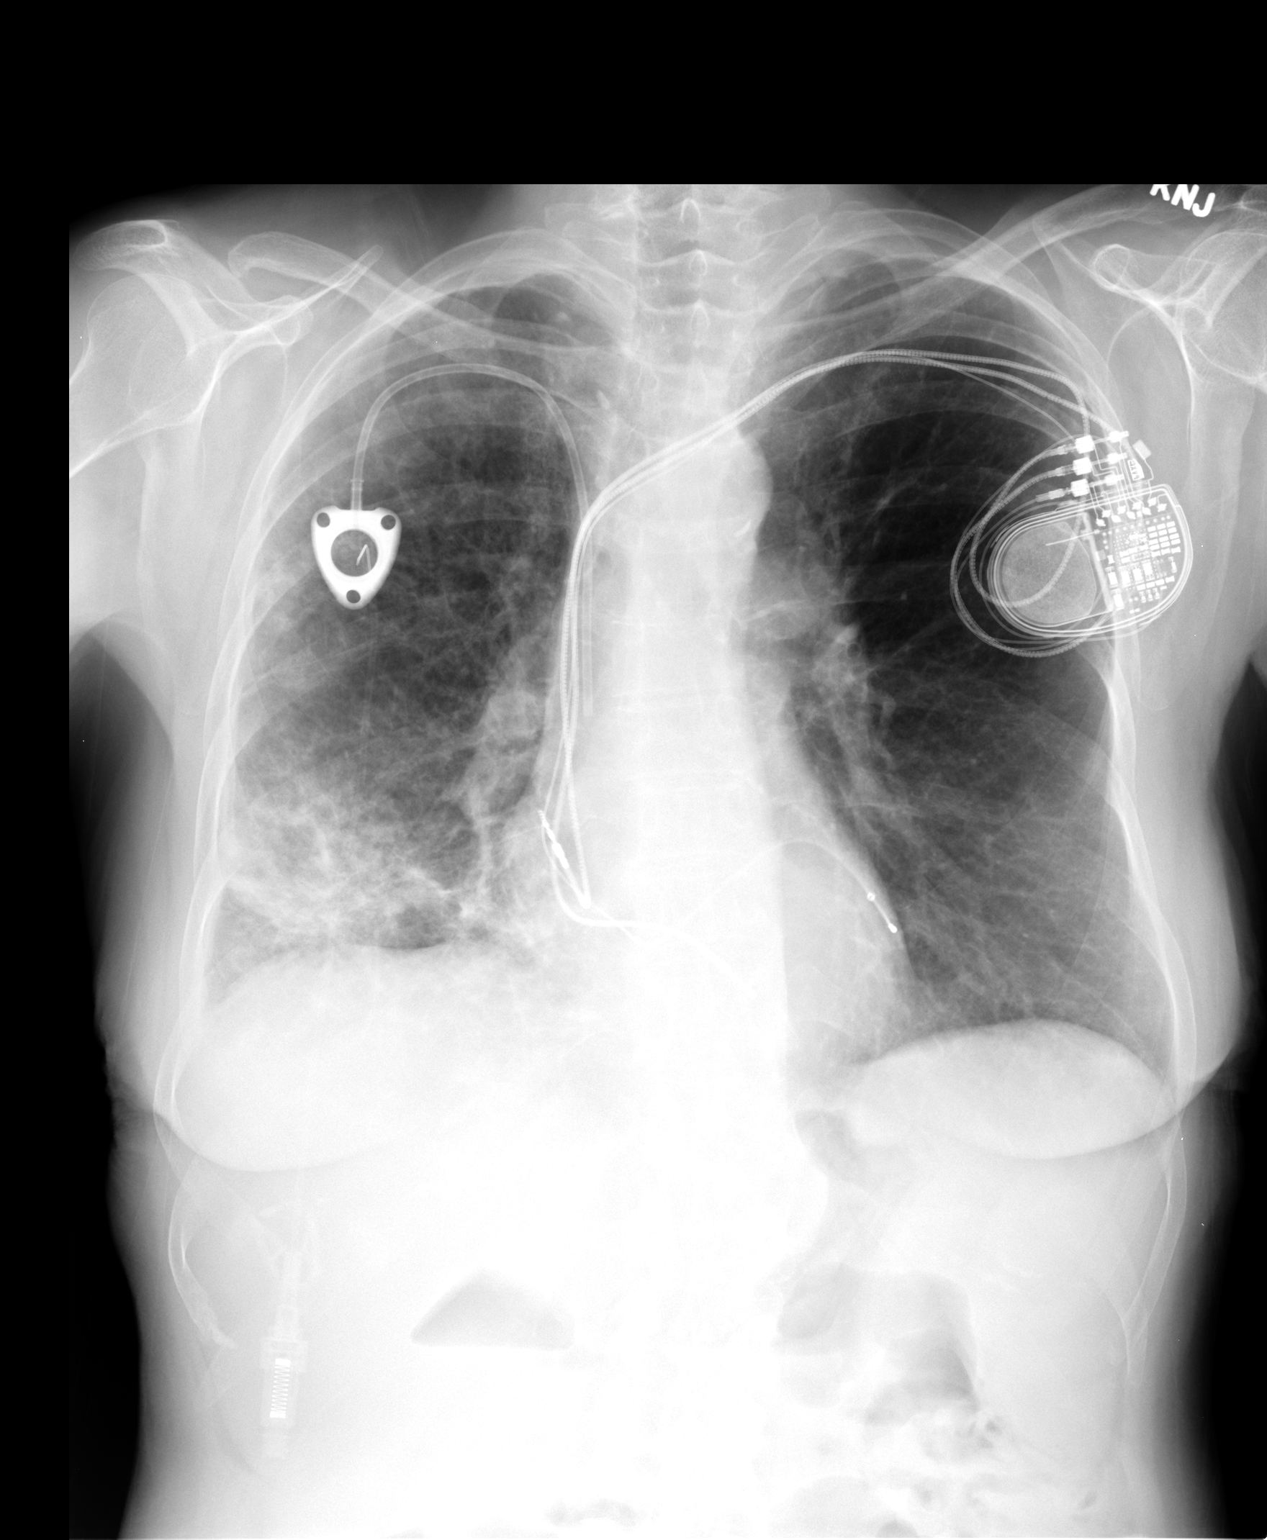

[view not recorded (2 of 2)]
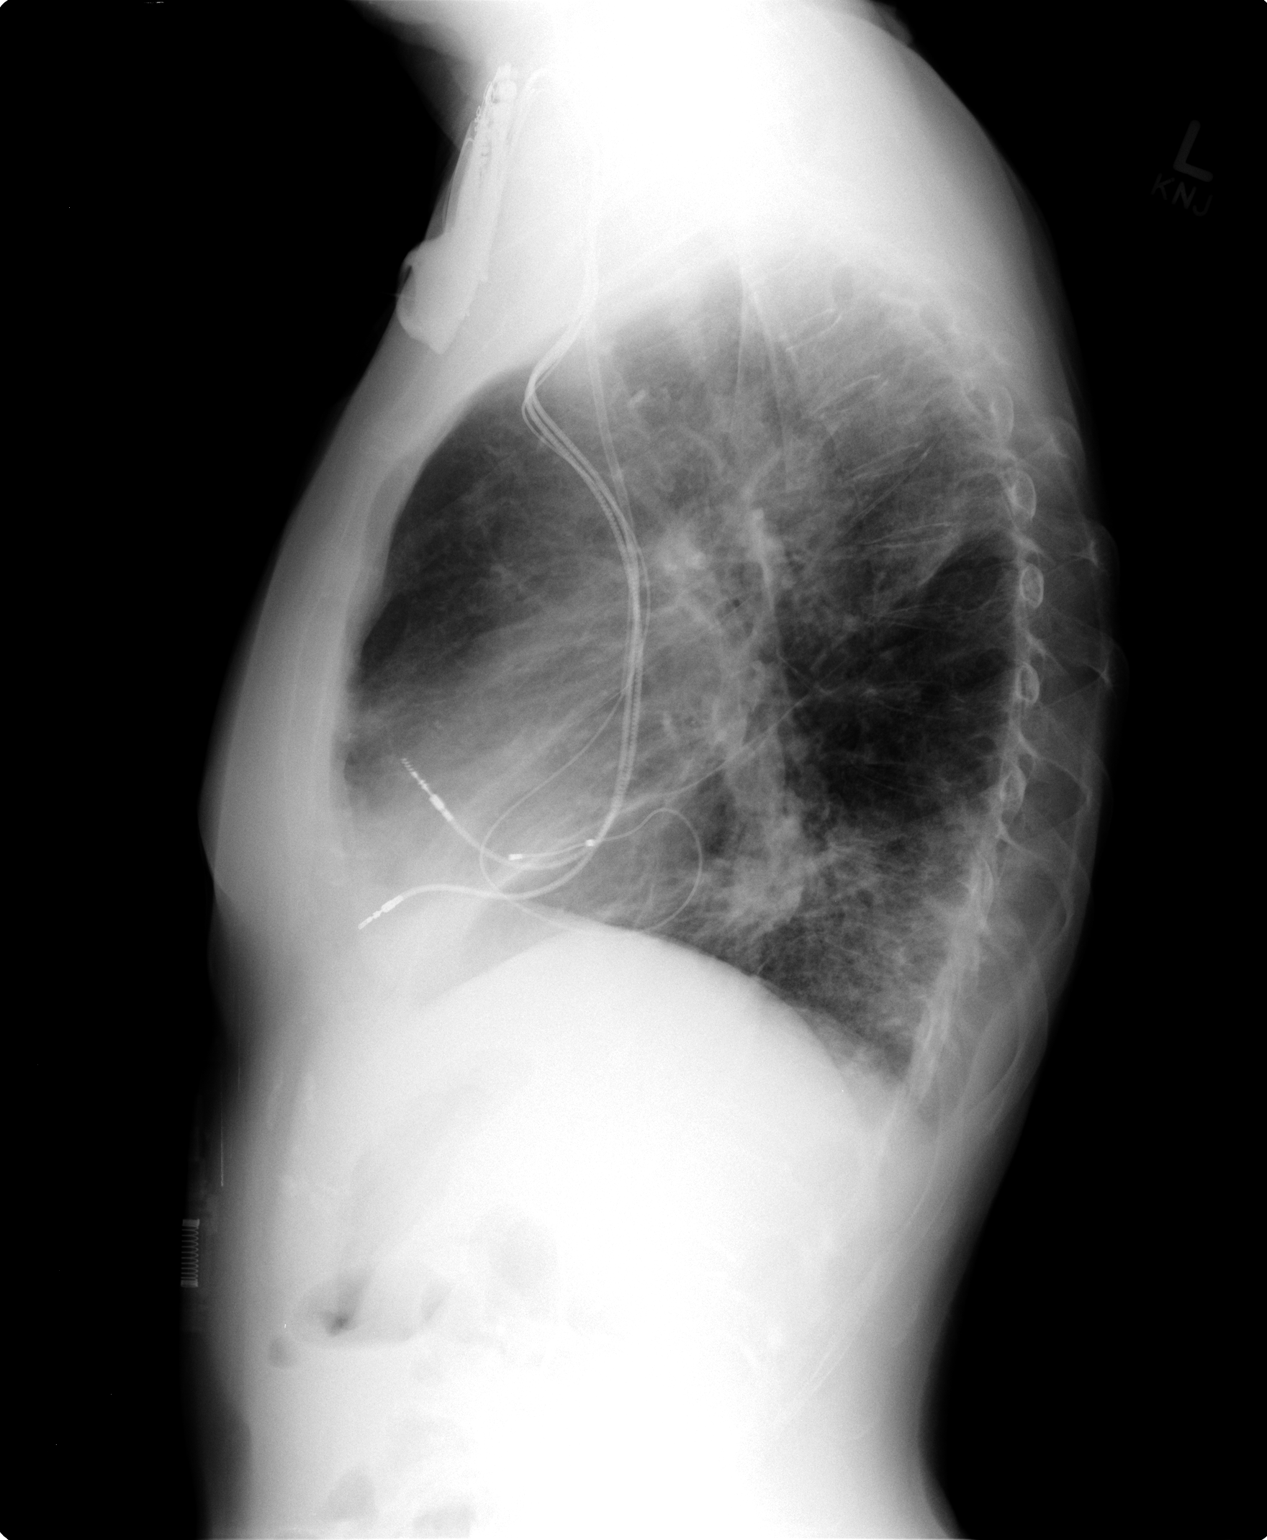

[2 of 2 positions shown; findings below may reference images not displayed]

FINDINGS: There is parenchymal opacity within the right lower lobe
consistent with pneumonia.  Minimal opacity is also noted in the
mid upper right lung which may represent a focus of pneumonia.  The
left lung is clear.  Mediastinal contours are stable.  The heart is
within normal limits in size.  A right-sided Port-A-Cath and left-
sided permanent pacemaker remain.  The bones are osteopenic.  There
is a mild thoracolumbar scoliosis present.
IMPRESSION: Right lower lobe pneumonia.

## 2012-01-11 ENCOUNTER — Ambulatory Visit (INDEPENDENT_AMBULATORY_CARE_PROVIDER_SITE_OTHER): Payer: Medicare Other | Admitting: Cardiology

## 2012-01-11 ENCOUNTER — Encounter: Payer: Self-pay | Admitting: Cardiology

## 2012-01-11 VITALS — BP 132/77 | HR 88 | Ht 61.0 in | Wt 99.8 lb

## 2012-01-11 DIAGNOSIS — I4891 Unspecified atrial fibrillation: Secondary | ICD-10-CM

## 2012-01-11 DIAGNOSIS — I428 Other cardiomyopathies: Secondary | ICD-10-CM

## 2012-01-11 NOTE — Progress Notes (Signed)
HPI She presents for followup of a nonischemic cardiomyopathy and atrial fibrillation. She has a history of a nonischemic cardiomyopathy though her last ejection fraction was 40-50%. She has a CRT.  Since I last saw her she said she think she's had one episode of chest discomfort. This was last week. She did take a nitroglycerin. The pain was indistinguishable from GI problems or any angina that she had previously. It was mild and went away with the nitroglycerin. There was no radiation of the discomfort to her jaw or arms. She had no associated shortness of breath, diaphoresis or nausea. However, after taking a nitroglycerin she did get up to go into the kitchen and apparently had a syncopal episode. She saw Dr. Graciela Husbands and there were no sustained arrhythmias noted. She has since had no further events. She does not describe any palpitations or orthostatic symptoms otherwise. Of note she is due to have an Interstim implant for treatment of bladder incontinence. She is actually referred here for preoperative clearance.   Allergies  Allergen Reactions  . Dabigatran Etexilate Mesylate Other (See Comments)    INTERNAL BLEEDING-pradaxa  . Talwin (Pentazocine) Other (See Comments)    hallucinations  . Pentazocine Lactate Other (See Comments)    HALLUCINATIONS  . Sulfonamide Derivatives Other (See Comments)    JITTERINESS     Current Outpatient Prescriptions  Medication Sig Dispense Refill  . albuterol (PROAIR HFA) 108 (90 BASE) MCG/ACT inhaler Inhale 2 puffs into the lungs every 6 (six) hours as needed for wheezing.  1 Inhaler  5  . azelastine (OPTIVAR) 0.05 % ophthalmic solution Place 1 drop into both eyes 2 (two) times daily.  6 mL  12  . calcium-vitamin D (OSCAL WITH D) 500-200 MG-UNIT per tablet Take 1 tablet by mouth 2 (two) times daily.  60 tablet  0  . carvedilol (COREG) 6.25 MG tablet TAKE 1 TABLET BY MOUTH TWICE DAILY WITH A MEAL  60 tablet  3  . cyclobenzaprine (FLEXERIL) 10 MG tablet  TAKE 1 TABLET BY MOUTH THREE TIMES DAILY AS NEEDED FOR MUSCLE SPASMS  60 tablet  0  . denosumab (PROLIA) 60 MG/ML SOLN Inject 60 mg into the skin every 6 (six) months. Twice a year      . etodolac (LODINE) 400 MG tablet Take 1 tablet by mouth 2 (two) times daily.      . fentaNYL (DURAGESIC - DOSED MCG/HR) 50 MCG/HR Place 1 patch (50 mcg total) onto the skin every 3 (three) days.  10 patch  0  . furosemide (LASIX) 20 MG tablet TAKE 1 TABLET BY MOUTH EVERY MORNING  30 tablet  5  . gabapentin (NEURONTIN) 600 MG tablet Take 1 tablet (600 mg total) by mouth 4 (four) times daily.  120 tablet  2  . nitroGLYCERIN (NITROSTAT) 0.4 MG SL tablet Place 1 tablet (0.4 mg total) under the tongue every 5 (five) minutes as needed for chest pain. CHEST PAIN   25 tablet  11  . ondansetron (ZOFRAN) 4 MG tablet Take 1 tablet (4 mg total) by mouth every 8 (eight) hours as needed for nausea.  20 tablet  0  . oxyCODONE-acetaminophen (PERCOCET) 10-325 MG per tablet Take 1 tablet by mouth every 4 (four) hours as needed. PAIN  60 tablet  0  . pantoprazole (PROTONIX) 40 MG tablet Take 1 tablet (40 mg total) by mouth daily.  30 tablet  3  . promethazine (PHENERGAN) 25 MG tablet Take 1 tablet (25 mg total) by mouth  every 8 (eight) hours as needed for nausea.  40 tablet  0  . temazepam (RESTORIL) 15 MG capsule TAKE 1-2 CAPSULES BY MOUTH EVERY NIGHT AT BEDTIME AS NEEDED FOR SLEEP  60 capsule  0  . tiotropium (SPIRIVA HANDIHALER) 18 MCG inhalation capsule Place 1 capsule (18 mcg total) into inhaler and inhale every morning.  30 capsule  5  . Vitamin D, Ergocalciferol, (DRISDOL) 50000 UNITS CAPS Take 1 capsule (50,000 Units total) by mouth every 7 (seven) days.  8 capsule  0  . DISCONTD: iron dextran complex (INFED) 50 MG/ML injection Please give infed infusion (no test dose needed) over 4 hours per pharmacy calculated dose. Ht: 5'2 Wt: 99 pounds Hgb: 12  1 mL  0   Current Facility-Administered Medications  Medication Dose Route  Frequency Provider Last Rate Last Dose  . cyanocobalamin ((VITAMIN B-12)) injection 1,000 mcg  1,000 mcg Intramuscular Q30 days Hart Carwin, MD   1,000 mcg at 11/12/11 1109    Past Medical History  Diagnosis Date  . GERD (gastroesophageal reflux disease)   . CAD (coronary artery disease)     mild disease per cath in 2011  . LBBB (left bundle branch block)   . Atrial fibrillation     on amiodarone  . Venous embolism and thrombosis of subclavian vein     after pacemaker insertion Oct 2011  . Pleural effusion      s/p right thoracentesis 03/09  . Small bowel obstruction   . Urinary incontinence      -INTERSTIM IMPLANT NOT FUNCTIONING PER PT  . RLS (restless legs syndrome)   . Primary dilated cardiomyopathy     EF 45 to 50% per echo in Jan 2012  . Vitamin B12 deficiency   . Gastroparesis   . Chronic nausea   . Chronic abdominal pain   . Chronic pain syndrome   . Pacemaker     CRT therapy; followed by Dr. Graciela Husbands  . H/O: GI bleed     from Pradaxa  . Oxygen dependent     2 liters via nasal cannula at all times  . CHF (congestive heart failure)   . Anemia   . Hx of stress fx aug 2011    right hip   . Elbow fracture, left aug 2012  . Arthritis     right hip  . Hx of gastric ulcer   . Diverticulitis   . HTN (hypertension)     under control; has been on med. x "years"  . COPD (chronic obstructive pulmonary disease)     continuous O2  . Hx MRSA infection   . Pneumonia - 03/09, 12/09, 02/10, 06/10    MOST RECENT FEB 2013  . Elevated liver enzymes   . Blood transfusion   . Anginal pain   . Shortness of breath   . Fibromyalgia     COUPLE OF TIMES A YEAR-PT HAS EPISODES OF CONFUSION-USUALLY INVOLVES DAY/NIGHT REVERSAL AND EPISODES OF TWITCHING AND FALLS--SEES DR. Modesto Charon - NEUROLOGIST-LAST SEEN 07/10/11    Past Surgical History  Procedure Date  . Appendectomy   . Pacemaker placement 12/21/2009  . Peg removed     with complications  . Tonsillectomy age 67  . Interstim  implant revision 03/06/2011    Procedure: REVISION OF Leane Platt;  Surgeon: Martina Sinner, MD;  Location: [facility];  Service: Urology;  Laterality: N/A;  Replacement of Neurostimulator  . Portacath placement 12/16/2009  . Total abdominal hysterectomy     complete  .  Interstim implant placement 05/28/2006 - stage I    06/05/2006 - stage II  . Interstim implant revision 10/23/2007  . Cystoscopy with injection 04/30/2006    transurethral collagen injection; incision vaginal stenosis  . Small intestine surgery 05/20/2001    ex. lap., resection of small bowel stricture; gastrostomy; insertion central line  . Cardiac catheterization 04/08/2006, 11/16/2009  . Cystoscopy 11/11/2008  . Cystoscopy w/ retrogrades 11/11/2008    right  . Colectomy     Intestinal resection (5times)  . Colectomy 02/04/2000    ex. lap., intra-abd. subtotal colectomy with ileosigmoid colon anastomosies and lysis of adhesions  . Gastrocutaneous fistula closure   . Exploratory laparotomy 04/27/2009    lysis of adhesions, gastrostomy tube  . Port-a-cath removal 07/27/2011    Procedure: REMOVAL PORT-A-CATH;  Surgeon: Emelia Loron, MD;  Location: [facility];  Service: General;  Laterality: N/A;  . Total hip arthroplasty 08/03/2011    Procedure: TOTAL HIP ARTHROPLASTY ANTERIOR APPROACH;  Surgeon: Kathryne Hitch, MD;  Location: [facility];  Service: Orthopedics;  Laterality: Right;    ROS:   Positive for fatigue, urinary incontinence. Otherwise as stated in the HPI and negative for all other systems.  PHYSICAL EXAM BP 132/77  Pulse 88  Ht 5\' 1"  (1.549 m)  Wt 99 lb 12.8 oz (45.269 kg)  BMI 18.86 kg/m2 GENERAL:  Frail appearing HEENT:  Pupils equal round and reactive, fundi not visualized, oral mucosa unremarkable NECK:  No jugular venous distention, waveform within normal limits, carotid upstroke brisk and symmetric, no bruits, no thyromegaly LYMPHATICS:  No cervical, inguinal adenopathy LUNGS:  Clear to  auscultation bilaterally BACK:  No CVA tenderness CHEST:  Port a cath, CRT pocket HEART:  PMI not displaced or sustained,S1 and S2 within normal limits, no S3, no S4, no clicks, no rubs, no murmurs ABD:  Flat, positive bowel sounds normal in frequency in pitch, no bruits, no rebound, no guarding, no midline pulsatile mass, no hepatomegaly, no splenomegaly EXT:  2 plus pulses throughout, no edema, no cyanosis no clubbing NEURO:  Non focal  ASSESSMENT AND PLAN  PRE OP - The patient has no high-risk findings that would preclude her from having the planned procedure. Certainly given her history of heart failure but providers attending to her should watch her fluid status carefully. However, no further cardiovascular testing is suggested.  ATRIAL FIBRILLATION -  She is maintaining sinus rhythm. She is off of amiodarone. At this point I think it is prudent to continue to hold this medication given her comorbid problems particularly her lung disease.  She apparently did have some infrequent paroxysms of fibrillation recently on device interrogation.  We will continue to follow this. Certainly with her frailty, iron deficiency anemia and multiple comorbidities anticoagulation would be higher risk.  CARDIOMYOPATHY, PRIMARY, DILATED -  Her EF is 40%. She will continue the meds as listed.  HYPERTENSION -  The blood pressure is at target. No change in medications is indicated. We will continue with therapeutic lifestyle changes (TLC).

## 2012-01-11 NOTE — Patient Instructions (Addendum)
The current medical regimen is effective;  continue present plan and medications.  Follow up in 6 months with Dr Hochrein.  You will receive a letter in the mail 2 months before you are due.  Please call us when you receive this letter to schedule your follow up appointment.  

## 2012-01-11 NOTE — Progress Notes (Signed)
NOTED PT MULTIPLE HEALTH ISSUES INCLUDING CHF, PACEMAKER, COPD W/ HYPOXEMIC AND O2 DEPENDANCE.  REVIEWED CHART W/ DR CARIGNIN MDA, PT NEEDS TO BE AT MAIN OR. LM FOR PAM AT OFFICE.

## 2012-01-14 ENCOUNTER — Other Ambulatory Visit: Payer: Self-pay | Admitting: Urology

## 2012-01-15 ENCOUNTER — Encounter (HOSPITAL_BASED_OUTPATIENT_CLINIC_OR_DEPARTMENT_OTHER): Admission: RE | Payer: Self-pay | Source: Ambulatory Visit

## 2012-01-15 ENCOUNTER — Ambulatory Visit (HOSPITAL_BASED_OUTPATIENT_CLINIC_OR_DEPARTMENT_OTHER): Admission: RE | Admit: 2012-01-15 | Payer: Medicare Other | Source: Ambulatory Visit | Admitting: Urology

## 2012-01-15 SURGERY — INSERTION, SACRAL NERVE STIMULATOR, INTERSTIM, STAGE 1
Anesthesia: General

## 2012-01-16 ENCOUNTER — Other Ambulatory Visit: Payer: Self-pay | Admitting: Family Medicine

## 2012-01-17 NOTE — Telephone Encounter (Signed)
Last seen 10/15/11 and filled 12/19/11 # 60.    KP

## 2012-01-18 ENCOUNTER — Encounter: Payer: Self-pay | Admitting: Family Medicine

## 2012-01-18 ENCOUNTER — Ambulatory Visit (INDEPENDENT_AMBULATORY_CARE_PROVIDER_SITE_OTHER): Payer: Medicare Other | Admitting: Family Medicine

## 2012-01-18 VITALS — BP 104/68 | HR 95 | Temp 98.1°F | Ht 61.0 in | Wt 104.3 lb

## 2012-01-18 DIAGNOSIS — N390 Urinary tract infection, site not specified: Secondary | ICD-10-CM

## 2012-01-18 MED ORDER — CEPHALEXIN 500 MG PO CAPS: 500.0000 mg | ORAL_CAPSULE | Freq: Two times a day (BID) | ORAL | Status: DC

## 2012-01-18 NOTE — Patient Instructions (Addendum)
This is most likely a bladder infection Start the Keflex twice daily w/ food for the infection Drink plenty of fluids Call with any questions or concerns Hang in there!!

## 2012-01-18 NOTE — Progress Notes (Signed)
  Subjective:    Patient ID: [name], female    DOB: 1945-03-30, 66 y.o.   MRN: [id number]  HPI Pt reports she's having suprapubic pain and low back pain.  sxs started yesterday.  Pt reports less urination than usual.  + pain w/ urination.  Denies incomplete emptying.  Some hesitancy.  + urgency- but this is not new for pt.  No fever.  Hx of frequent UTIs- this feels similar.  Took AZO for pain relief- sxs improved.  No blood in urine.   Review of Systems For ROS see HPI     Objective:   Physical Exam  Vitals reviewed. Constitutional: She appears well-developed and well-nourished. No distress.  Abdominal: Soft. She exhibits no distension. There is tenderness (+ suprapubic but no CVA tenderness).          Assessment & Plan:

## 2012-01-18 NOTE — Telephone Encounter (Signed)
Dr. Jenene Slicker OV note indicating surgical clearance sent to Dr. Mina Marble office at [facility].

## 2012-01-18 NOTE — Assessment & Plan Note (Signed)
Pt's sxs consistent w/ infxn but pt's UA invalid due to AZO.  Start abx for presumed infxn.  Reviewed supportive care and red flags that should prompt return.  Pt expressed understanding and is in agreement w/ plan.

## 2012-01-20 ENCOUNTER — Other Ambulatory Visit: Payer: Self-pay | Admitting: Internal Medicine

## 2012-01-20 DIAGNOSIS — K219 Gastro-esophageal reflux disease without esophagitis: Secondary | ICD-10-CM

## 2012-01-21 ENCOUNTER — Other Ambulatory Visit: Payer: Self-pay | Admitting: Family Medicine

## 2012-01-21 DIAGNOSIS — R52 Pain, unspecified: Secondary | ICD-10-CM

## 2012-01-21 MED ORDER — OXYCODONE-ACETAMINOPHEN 10-325 MG PO TABS: 1.0000 | ORAL_TABLET | ORAL | Status: DC | PRN

## 2012-01-22 LAB — URINE CULTURE: Colony Count: >=100000

## 2012-01-28 ENCOUNTER — Encounter (HOSPITAL_COMMUNITY): Payer: Self-pay

## 2012-01-28 NOTE — Patient Instructions (Signed)
19 [name]  01/28/2012   Your procedure is scheduled on:  02/05/12 1145AM-1220PM  Report to [facility] at 0915 AM.  Call this number if you have problems the morning of surgery: [phone]   Remember:   Do not eat food:After Midnight.  May have clear liquids:until Midnight .  [name]  Take these medicines the morning of surgery with A SIP OF WATER:    Do not wear jewelry, make-up or nail polish.  Do not wear lotions, powders, or perfumes. .  Do not shave 48 hours prior to surgery.   Do not bring valuables to the hospital.  Contacts, dentures or bridgework may not be worn into surgery.  Leave suitcase in the car. After surgery it may be brought to your room.  For patients admitted to the hospital, checkout time is 11:00 AM the day of discharge.   Patients discharged the day of surgery will not be allowed to drive home.  Name and phone number of your driver:             See chg instruction sheet.    Please read over the following fact sheets that you were given: MRSA Information, COUGHING AND DEEP BREATHING EXERCISES, LEG EXERCISES

## 2012-01-29 ENCOUNTER — Encounter (HOSPITAL_COMMUNITY)
Admission: RE | Admit: 2012-01-29 | Discharge: 2012-01-29 | Disposition: A | Discharge status: Home / Self Care | Payer: Medicare Other | Source: Ambulatory Visit | Attending: Urology | Admitting: Urology

## 2012-01-29 ENCOUNTER — Encounter: Payer: Self-pay | Admitting: Family Medicine

## 2012-01-29 ENCOUNTER — Encounter (HOSPITAL_COMMUNITY): Payer: Self-pay

## 2012-01-29 ENCOUNTER — Ambulatory Visit (INDEPENDENT_AMBULATORY_CARE_PROVIDER_SITE_OTHER): Payer: Medicare Other | Admitting: Family Medicine

## 2012-01-29 VITALS — BP 132/82 | HR 98 | Temp 98.6°F | Wt 98.6 lb

## 2012-01-29 DIAGNOSIS — D696 Thrombocytopenia, unspecified: Secondary | ICD-10-CM | POA: Insufficient documentation

## 2012-01-29 HISTORY — DX: Inflammatory liver disease, unspecified: K75.9

## 2012-01-29 LAB — CBC WITH DIFFERENTIAL/PLATELET
Basophils Absolute: 0 10*3/uL (ref 0.0–0.1)
Basophils Relative: 0 % (ref 0–1)
Eosinophils Absolute: 0.1 10*3/uL (ref 0.0–0.7)
Eosinophils Relative: 1 % (ref 0–5)
HCT: 33.4 % — ABNORMAL LOW (ref 36.0–46.0)
Hemoglobin: 10.8 g/dL — ABNORMAL LOW (ref 12.0–15.0)
Lymphocytes Relative: 6 % — ABNORMAL LOW (ref 12–46)
Lymphs Abs: 0.6 10*3/uL — ABNORMAL LOW (ref 0.7–4.0)
MCH: 29.4 pg (ref 26.0–34.0)
MCHC: 32.3 g/dL (ref 30.0–36.0)
MCV: 91 fL (ref 78.0–100.0)
Monocytes Absolute: 0.7 10*3/uL (ref 0.1–1.0)
Monocytes Relative: 7 % (ref 3–12)
Neutro Abs: 9 10*3/uL — ABNORMAL HIGH (ref 1.7–7.7)
Neutrophils Relative %: 86 % — ABNORMAL HIGH (ref 43–77)
Platelets: 345 10*3/uL (ref 150–400)
RBC: 3.67 MIL/uL — ABNORMAL LOW (ref 3.87–5.11)
RDW: 14.3 % (ref 11.5–15.5)
WBC: 10.5 10*3/uL (ref 4.0–10.5)

## 2012-01-29 LAB — BASIC METABOLIC PANEL
BUN: 18 mg/dL (ref 6–23)
CO2: 28 mEq/L (ref 19–32)
Calcium: 7.4 mg/dL — ABNORMAL LOW (ref 8.4–10.5)
Chloride: 101 mEq/L (ref 96–112)
Creatinine, Ser: 0.58 mg/dL (ref 0.50–1.10)
GFR calc Af Amer: >90 mL/min (ref >90)
GFR calc non Af Amer: >90 mL/min (ref >90)
Glucose, Bld: 89 mg/dL (ref 70–99)
Potassium: 4 mEq/L (ref 3.5–5.1)
Sodium: 138 mEq/L (ref 135–145)

## 2012-01-29 LAB — SURGICAL PCR SCREEN
MRSA, PCR: NEGATIVE
Staphylococcus aureus: NEGATIVE

## 2012-01-29 MED ORDER — METHYLPREDNISOLONE ACETATE 80 MG/ML IJ SUSP
80.0000 mg | Freq: Once | INTRAMUSCULAR | Status: AC
  Administered 2012-01-29: 80 mg via INTRAMUSCULAR

## 2012-01-29 MED ORDER — PREDNISONE 10 MG PO TABS: ORAL_TABLET | ORAL | Status: DC

## 2012-01-29 NOTE — Progress Notes (Signed)
Device orders resent to Device Clinic at [facility]  Due to  Orders were sent back but not signed

## 2012-01-29 NOTE — Progress Notes (Signed)
  Subjective:    Patient ID: [name], female    DOB: 05/24/1945, 66 y.o.   MRN: [id number]  HPI Pt was sent over here from urology with a low platelets <5.  She is asymptomatic and denies any bleeding.  We tried to get  Urine but she was unable to urinate.   No other complaints.     Review of Systems As above    Objective:   Physical Exam  BP 132/82  Pulse 98  Temp 98.6 F (37 C) (Oral)  Wt 98 lb 9.6 oz (44.725 kg)  SpO2 98% General appearance: alert, cooperative, appears stated age and no distress Neurologic: Grossly normal      Assessment & Plan:

## 2012-01-29 NOTE — Progress Notes (Signed)
Asher Muir called back from office to state that CBC results had been received by fax.  Nurse called office back at 1140am to make sure that Delice Bison had notified Dr Sherron Monday as she previously stated.  Delice Bison was unavailable but office personnel checked computer and stated that Delice Bison had notified Dr Sherron Monday of critical value platelet count results.

## 2012-01-29 NOTE — Progress Notes (Signed)
Last office visit with Dr Daiva Nakayama- 01/11/12-EPIC Last office visit with Dr Graciela Husbands 01/09/12 in [facility]  Last Device Observation 01/09/12 [facility]  03/24/10 ECHO IN EPIC  STRESS TEST - 2009 [facility]

## 2012-01-29 NOTE — Assessment & Plan Note (Signed)
D/w with hematology Prednisone taper and depo medrol Check cbcd stat and smear Refer to hematology

## 2012-01-29 NOTE — Progress Notes (Signed)
Critical value called to me by Eunice Blase in Lab.  Platelet count < 5.  Sample short per debbie in Lab.  Called and spoke with Delice Bison , nurse in office of Dr Sherron Monday.  Faxed CBC results to [phone].  Delice Bison stated she would notify Dr Sherron Monday.  Fax confirmation received of CBC results to [phone].  Called office to also verify fax CBC results received.  Nurse to call me back at [phone].

## 2012-01-29 NOTE — Progress Notes (Signed)
Device Clinic faxed back perioperative implanted device orders signed by Dr Graciela Husbands .  Placed on chart at front.

## 2012-01-30 ENCOUNTER — Telehealth: Payer: Self-pay | Admitting: Hematology & Oncology

## 2012-01-30 LAB — CBC

## 2012-01-30 LAB — PATHOLOGIST SMEAR REVIEW

## 2012-01-30 NOTE — Progress Notes (Signed)
According to [facility] patient was sent to PCP  on 01/29/12 for recheck of CBC.  Results of CBC are in EPIC.

## 2012-01-30 NOTE — Telephone Encounter (Signed)
Per Dr. Myna Hidalgo blood work normal for pt. Called and talked to [facility] with Dr. Ernst Spell office. She said Kim Dr. Ernst Spell Nurse will call me back after talking to MD

## 2012-01-30 NOTE — Progress Notes (Signed)
[facility] lab called and stated that CBC done on 01/29/12 in am would be credited to patient per [place] in lab.  Pam called from office of Dr Sherron Monday this am and asked if I had seen results of CBC done 01/29/12 pm by office of Dr Laury Axon.  I told her I had seen PCP visit and lab results.

## 2012-02-01 ENCOUNTER — Ambulatory Visit (INDEPENDENT_AMBULATORY_CARE_PROVIDER_SITE_OTHER): Payer: Medicare Other | Admitting: Internal Medicine

## 2012-02-01 ENCOUNTER — Encounter: Payer: Self-pay | Admitting: Internal Medicine

## 2012-02-01 VITALS — BP 140/82 | HR 96 | Ht 60.5 in | Wt 97.4 lb

## 2012-02-01 DIAGNOSIS — K56609 Unspecified intestinal obstruction, unspecified as to partial versus complete obstruction: Secondary | ICD-10-CM

## 2012-02-01 DIAGNOSIS — K219 Gastro-esophageal reflux disease without esophagitis: Secondary | ICD-10-CM

## 2012-02-01 DIAGNOSIS — R195 Other fecal abnormalities: Secondary | ICD-10-CM

## 2012-02-01 MED ORDER — PANTOPRAZOLE SODIUM 40 MG PO TBEC: 40.0000 mg | DELAYED_RELEASE_TABLET | Freq: Every day | ORAL | Status: DC

## 2012-02-01 NOTE — Patient Instructions (Addendum)
We have sent the following medications to your pharmacy for you to pick up at your convenience: Protonix  We have given you your monthly b12 injection. Please schedule your next injection for 1 month from now.  Please get your labs completed (CBC, IBC, Ferritin) with [facility].  Please follow up with Dr Juanda Chance in 3 months.  CC: Dr Loreen Freud

## 2012-02-01 NOTE — Progress Notes (Signed)
[name] December 10, 1945 MRN [id number]   History of Present Illness:  This is a 66 year old white female who is post hospitalization for a small bowel obstruction. She has chronic adhesive disease due to multiple abdominal surgeries. Her last exploratory laparotomy was in March 2011. She has chronic abdominal pain and failure to thrive. She has known ischemic cardiomyopathy with an ejection fraction of 40%, chronic anemia. She also has chronically Hemoccult-positive stool. Her last colonoscopy in October 2012 showed a subtotal colectomy with a 20 cm long colon segment. She had a stricture in the distal ileum.There is a history of a gastrocutaneous fistula which closed spontaneously on TPN. She has oxygen-dependent COPD, atrial fibrillation and permanent transvenous pacemaker. Her weight has been stable around 97 to 100 pounds. She reports only one episode of partial obstruction since the discharge from the hospital.on 12/18/2011. She has been tolrating  regular diet.   Past Medical History  Diagnosis Date  . GERD (gastroesophageal reflux disease)   . CAD (coronary artery disease)     mild disease per cath in 2011  . Venous embolism and thrombosis of subclavian vein     after pacemaker insertion Oct 2011  . Pleural effusion      s/p right thoracentesis 03/09  . Small bowel obstruction   . Urinary incontinence      -INTERSTIM IMPLANT NOT FUNCTIONING PER PT  . RLS (restless legs syndrome)   . Primary dilated cardiomyopathy     EF 45 to 50% per echo in Jan 2012  . Vitamin B12 deficiency   . Gastroparesis   . Chronic nausea   . Chronic abdominal pain   . Chronic pain syndrome   . Pacemaker     CRT therapy; followed by Dr. Graciela Husbands  . H/O: GI bleed     from Pradaxa  . Oxygen dependent     2 liters via nasal cannula at all times  . CHF (congestive heart failure)   . Anemia   . Hx of stress fx aug 2011    right hip   . Elbow fracture, left aug 2012  . Arthritis     right hip  . Hx of  gastric ulcer   . Diverticulitis   . HTN (hypertension)     under control; has been on med. x "years"  . Hx MRSA infection   . Pneumonia - 03/09, 12/09, 02/10, 06/10    MOST RECENT FEB 2013  . Elevated liver enzymes   . Blood transfusion   . Anginal pain   . Shortness of breath   . Fibromyalgia     COUPLE OF TIMES A YEAR-PT HAS EPISODES OF CONFUSION-USUALLY INVOLVES DAY/NIGHT REVERSAL AND EPISODES OF TWITCHING AND FALLS--SEES DR. Modesto Charon - NEUROLOGIST-LAST SEEN 07/10/11  . LBBB (left bundle branch block)   . Atrial fibrillation   . COPD (chronic obstructive pulmonary disease)     continuous O2- 2l  . Hepatitis     hx of in high school    Past Surgical History  Procedure Date  . Appendectomy   . Pacemaker placement 12/21/2009  . Peg removed     with complications  . Tonsillectomy age 26  . Interstim implant revision 03/06/2011    Procedure: REVISION OF Leane Platt;  Surgeon: Martina Sinner, MD;  Location: [facility];  Service: Urology;  Laterality: N/A;  Replacement of Neurostimulator  . Portacath placement 12/16/2009  . Total abdominal hysterectomy     complete  . Interstim implant placement 05/28/2006 - stage I  06/05/2006 - stage II  . Interstim implant revision 10/23/2007  . Cystoscopy with injection 04/30/2006    transurethral collagen injection; incision vaginal stenosis  . Small intestine surgery 05/20/2001    ex. lap., resection of small bowel stricture; gastrostomy; insertion central line  . Cardiac catheterization 04/08/2006, 11/16/2009  . Cystoscopy 11/11/2008  . Cystoscopy w/ retrogrades 11/11/2008    right  . Colectomy     Intestinal resection (5times)  . Colectomy 02/04/2000    ex. lap., intra-abd. subtotal colectomy with ileosigmoid colon anastomosies and lysis of adhesions  . Gastrocutaneous fistula closure   . Exploratory laparotomy 04/27/2009    lysis of adhesions, gastrostomy tube  . Port-a-cath removal 07/27/2011    Procedure: REMOVAL PORT-A-CATH;  Surgeon: Emelia Loron, MD;  Location: [facility];  Service: General;  Laterality: N/A;  . Total hip arthroplasty 08/03/2011    Procedure: TOTAL HIP ARTHROPLASTY ANTERIOR APPROACH;  Surgeon: Kathryne Hitch, MD;  Location: [facility];  Service: Orthopedics;  Laterality: Right;    reports that she quit smoking about 25 years ago. She has never used smokeless tobacco. She reports that she does not drink alcohol or use illicit drugs. family history includes Breast cancer in her maternal aunt; Diabetes in her paternal grandfather; Heart disease in her brother, father, mother, and paternal aunt; and Stroke in her mother.  There is no history of Colon cancer. Allergies  Allergen Reactions  . Dabigatran Etexilate Mesylate Other (See Comments)    INTERNAL BLEEDING-pradaxa  . Talwin (Pentazocine) Other (See Comments)    hallucinations  . Pentazocine Lactate Other (See Comments)    HALLUCINATIONS  . Sulfonamide Derivatives Other (See Comments)    JITTERINESS         Review of Systems: Periodic nausea or vomiting. Denies dysphagia.  The remainder of the 10 point ROS is negative except as outlined in H&P   Physical Exam: General appearance in no distress. And chronically ill-appearing with nasal oxygen Eyes- non icteric. HEENT nontraumatic, normocephalic. Mouth no lesions, tongue papillated, no cheilosis. Neck supple without adenopathy, thyroid not enlarged, no carotid bruits, no JVD. Lungs Clear to auscultation bilaterally. Decreased breath sounds no wheezes Cor normal S1, normal S2, regular rhythm, no murmur,  quiet precordium. Abdomen: distended tight abdomen very firm. Increased bowel sounds. Increased tympany throughout. Well-healed surgical scars. Minimal tenderness. Rectal: Soft Hemoccult positive stool. Extremities no pedal edema. Skin no lesions. Neurological alert and oriented x 3. Psychological normal mood and affect.  Assessment and Plan:  Problem #1 GI blood loss from  GI tract likely originated in the stricture of the distal small bowel which is not surgically accessible. She is maintaining her hemoglobin. We will have her blood count rechecked when she is having a urological procedure next week. We will also check her iron studies.  Problem #2 B12 deficiency. We will give her 1,000 mcg B12 IM today.  Problem #3 Chronic  partial small bowel obstruction with recent hospitalization due to severe adhesive disease. She is not a surgical candidate.  Problem #4 GI bleed due to Pradaxa. Patient is taking Protonix 40 mg daily.  Problem #5 Status post subtotal colectomy with only 20 cm of colon left. She is up-to-date on her sigmoidoscopy.    02/01/2012 [name]

## 2012-02-04 NOTE — H&P (Signed)
History of Present Illness   [name] unfortunately had her generator changed and then we found that the lead was not in good position. We could not trouble shoot this with the battery not working. The impedance on the existing lead is not good at position zero but is fine in all other 3 positions.   Today I am going to get a lateral and AP view of her pelvis.   Her incisions look great. She is on home oxygen.   She has medical comorbidities.   She would like me to try to replace the lead and place it in a different position using motor and sensory responses intraoperatively. She understands the risks and then potential risk of complications or failure. She understands the risk of infection on reopening the implant again. Review of systems: No change in bowel or neurologic status.   We did this AP and lateral of the sacrum and it reminded me, based up on the x-ray and by history, that she had a retained tip. I will get hard copies of this made and have asked for a disk to be made as well.    Past Medical History Problems  1. History of  Arthritis V13.4 2. History of  Esophageal Reflux 530.81 3. History of  Fibromyalgia 729.1 4. History of  Hepatitis 573.3 5. History of  Hypertension 401.9 6. History of  Osteoarthritis V13.4 7. History of  Peptic Ulcer V12.71  Surgical History Problems  1. History of  Cystoscopy With Ureteral Catheterization 2. History of  Hand Surgery 3. History of  Install Sacral Nerve Neurostimulator By Incision 4. History of  Peripheral Nerve Neurostimulator Revision Of Pulse Generator  Current Meds 1. Cardura 4 MG Oral Tablet; Therapy: (Recorded:28Nov2007) to 2. Carvedilol 6.25 MG Oral Tablet; Therapy: 23Aug2011 to 3. CeleBREX 200 MG Oral Capsule; Therapy: (Recorded:28Nov2007) to 4. Cyclobenzaprine HCl TABS; Therapy: (Recorded:28Nov2007) to 5. FentaNYL 50 MCG/HR Transdermal Patch 72 Hour; Therapy: (Recorded:28Nov2007) to 6. Furosemide 20 MG Oral Tablet;  Therapy: 09Jan2012 to 7. Gabapentin 600 MG Oral Tablet; Therapy: (Recorded:28Nov2007) to 8. Meloxicam 15 MG Oral Tablet; Therapy: 03Feb2012 to 9. Metoclopramide HCl 5 MG Oral Tablet; Therapy: 07Nov2011 to 10. Nitrofurantoin Macrocrystal 100 MG Oral Capsule; TAKE 100 MG Twice daily; Therapy: 19Oct2012   to (Evaluate:26Oct2012)  Requested for: 19Oct2012; Last Rx:19Oct2012 11. Nitrostat 0.4 MG Sublingual Tablet Sublingual; Therapy: (Recorded:16Oct2012) to 12. Ondansetron HCl 8 MG Oral Tablet; Therapy: 05Jul2012 to 13. Oxycodone-Acetaminophen 10-325 MG Oral Tablet; Therapy: (Recorded:28Nov2007) to 14. Pantoprazole Sodium 40 MG Oral Tablet Delayed Release; Therapy: 05Jul2012 to 15. Prolia 60 MG/ML Subcutaneous Solution; Therapy: (Recorded:16Oct2012) to 16. Resource Breeze Oral Liquid; Therapy: (Recorded:16Oct2012) to 17. Temazepam 15 MG Oral Capsule; Therapy: (Recorded:28Nov2007) to 18. Toviaz 8 MG Oral Tablet Extended Release 24 Hour; TAKE 1 TABLET DAILY; Therapy: 09Jul2010   to (Evaluate:04Jul2011); Last Rx:09Jul2010 19. Zofran ODT 8 MG Oral Tablet Dispersible; Therapy: (Recorded:16Oct2012) to  Allergies Medication  1. Pradaxa CAPS 2. Sulfa Drugs 3. Talwin NX TABS  Family History Problems  1. Family history of  Family Health Status Number Of Children 3 SONS, 2 DAUGHTERS 2. Paternal history of  Heart Surgery HEART BY PASS 3. Maternal history of  Stroke Syndrome V17.1 4. Paternal history of  Total Hip Replacement 5. Paternal history of  Urologic Disorder V18.7  Social History Problems  1. Caffeine Use 3 CUPS COFFEE, 1 DIET DRINK/DAY 2. Family history of  Death In The Family Mother AGE 63 3. Former Smoker V15.82 4. Marital History - Currently  Married 5. 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 Alcohol Use 7. Tobacco Use V15.82  Vitals Vital Signs [Data Includes: Last 1 Day]  28Dec2012 10:59AM  Blood Pressure: 123 / 76 Temperature: 98.4 F Heart Rate:  80  Assessment Assessed  1. Urge Incontinence Of Urine 788.31 2. Urinary Frequency 788.41  Discussion/Summary   It would be prudent to try the left side for [name], but make certain we have good sensory and motor responses. Otherwise, I will try the right side again, but theoretically we could be running out of space in the foramen. I will leave [name] for approximately 3 more weeks to let her incisions heal well and then proceed forward.  After a thorough review of the management options for the patient's condition the patient  elected to proceed with surgical therapy as noted above. We have discussed the potential benefits and risks of the procedure, side effects of the proposed treatment, the likelihood of the patient achieving the goals of the procedure, and any potential problems that might occur during the procedure or recuperation. Informed consent has been obtained.

## 2012-02-05 ENCOUNTER — Encounter (HOSPITAL_COMMUNITY): Payer: Self-pay | Admitting: *Deleted

## 2012-02-05 ENCOUNTER — Ambulatory Visit (HOSPITAL_COMMUNITY): Payer: Medicare Other

## 2012-02-05 ENCOUNTER — Encounter (HOSPITAL_COMMUNITY)
Admission: RE | Disposition: A | Discharge status: Home / Self Care | Payer: Self-pay | Source: Ambulatory Visit | Attending: Urology

## 2012-02-05 ENCOUNTER — Encounter (HOSPITAL_COMMUNITY): Payer: Self-pay | Admitting: Anesthesiology

## 2012-02-05 ENCOUNTER — Ambulatory Visit (HOSPITAL_COMMUNITY)
Admission: RE | Admit: 2012-02-05 | Discharge: 2012-02-05 | Disposition: A | Discharge status: Home / Self Care | Payer: Medicare Other | Source: Ambulatory Visit | Attending: Urology | Admitting: Urology

## 2012-02-05 ENCOUNTER — Other Ambulatory Visit: Payer: Self-pay | Admitting: Family Medicine

## 2012-02-05 ENCOUNTER — Ambulatory Visit (HOSPITAL_COMMUNITY): Payer: Medicare Other | Admitting: Anesthesiology

## 2012-02-05 DIAGNOSIS — IMO0001 Reserved for inherently not codable concepts without codable children: Secondary | ICD-10-CM | POA: Insufficient documentation

## 2012-02-05 DIAGNOSIS — N3941 Urge incontinence: Secondary | ICD-10-CM | POA: Insufficient documentation

## 2012-02-05 DIAGNOSIS — K219 Gastro-esophageal reflux disease without esophagitis: Secondary | ICD-10-CM | POA: Insufficient documentation

## 2012-02-05 DIAGNOSIS — Z01812 Encounter for preprocedural laboratory examination: Secondary | ICD-10-CM | POA: Insufficient documentation

## 2012-02-05 DIAGNOSIS — Y831 Surgical operation with implant of artificial internal device as the cause of abnormal reaction of the patient, or of later complication, without mention of misadventure at the time of the procedure: Secondary | ICD-10-CM | POA: Insufficient documentation

## 2012-02-05 DIAGNOSIS — T8389XA Other specified complication of genitourinary prosthetic devices, implants and grafts, initial encounter: Secondary | ICD-10-CM | POA: Insufficient documentation

## 2012-02-05 DIAGNOSIS — Z79899 Other long term (current) drug therapy: Secondary | ICD-10-CM | POA: Insufficient documentation

## 2012-02-05 DIAGNOSIS — I10 Essential (primary) hypertension: Secondary | ICD-10-CM | POA: Insufficient documentation

## 2012-02-05 HISTORY — PX: INTERSTIM IMPLANT PLACEMENT: SHX5130

## 2012-02-05 LAB — DIFFERENTIAL
Basophils Absolute: 0 10*3/uL (ref 0.0–0.1)
Basophils Relative: 0 % (ref 0–1)
Eosinophils Absolute: 0.1 10*3/uL (ref 0.0–0.7)
Eosinophils Relative: 1 % (ref 0–5)
Lymphocytes Relative: 9 % — ABNORMAL LOW (ref 12–46)
Lymphs Abs: 0.8 10*3/uL (ref 0.7–4.0)
Monocytes Absolute: 0.6 10*3/uL (ref 0.1–1.0)
Monocytes Relative: 7 % (ref 3–12)
Neutro Abs: 8.2 10*3/uL — ABNORMAL HIGH (ref 1.7–7.7)
Neutrophils Relative %: 84 % — ABNORMAL HIGH (ref 43–77)

## 2012-02-05 LAB — CBC
HCT: 33.2 % — ABNORMAL LOW (ref 36.0–46.0)
Hemoglobin: 10.8 g/dL — ABNORMAL LOW (ref 12.0–15.0)
MCH: 30.2 pg (ref 26.0–34.0)
MCHC: 32.5 g/dL (ref 30.0–36.0)
MCV: 92.7 fL (ref 78.0–100.0)
Platelets: 330 10*3/uL (ref 150–400)
RBC: 3.58 MIL/uL — ABNORMAL LOW (ref 3.87–5.11)
RDW: 14.6 % (ref 11.5–15.5)
WBC: 9.7 10*3/uL (ref 4.0–10.5)

## 2012-02-05 IMAGING — CR DG CHEST 2V
2 series · 2 of 2 positions shown · non-contrast
Comparison: 03/27/2011

CLINICAL DATA: Pneumonia.  Cough and congestion.

CHEST - 2 VIEW

[view not recorded (1 of 2)]
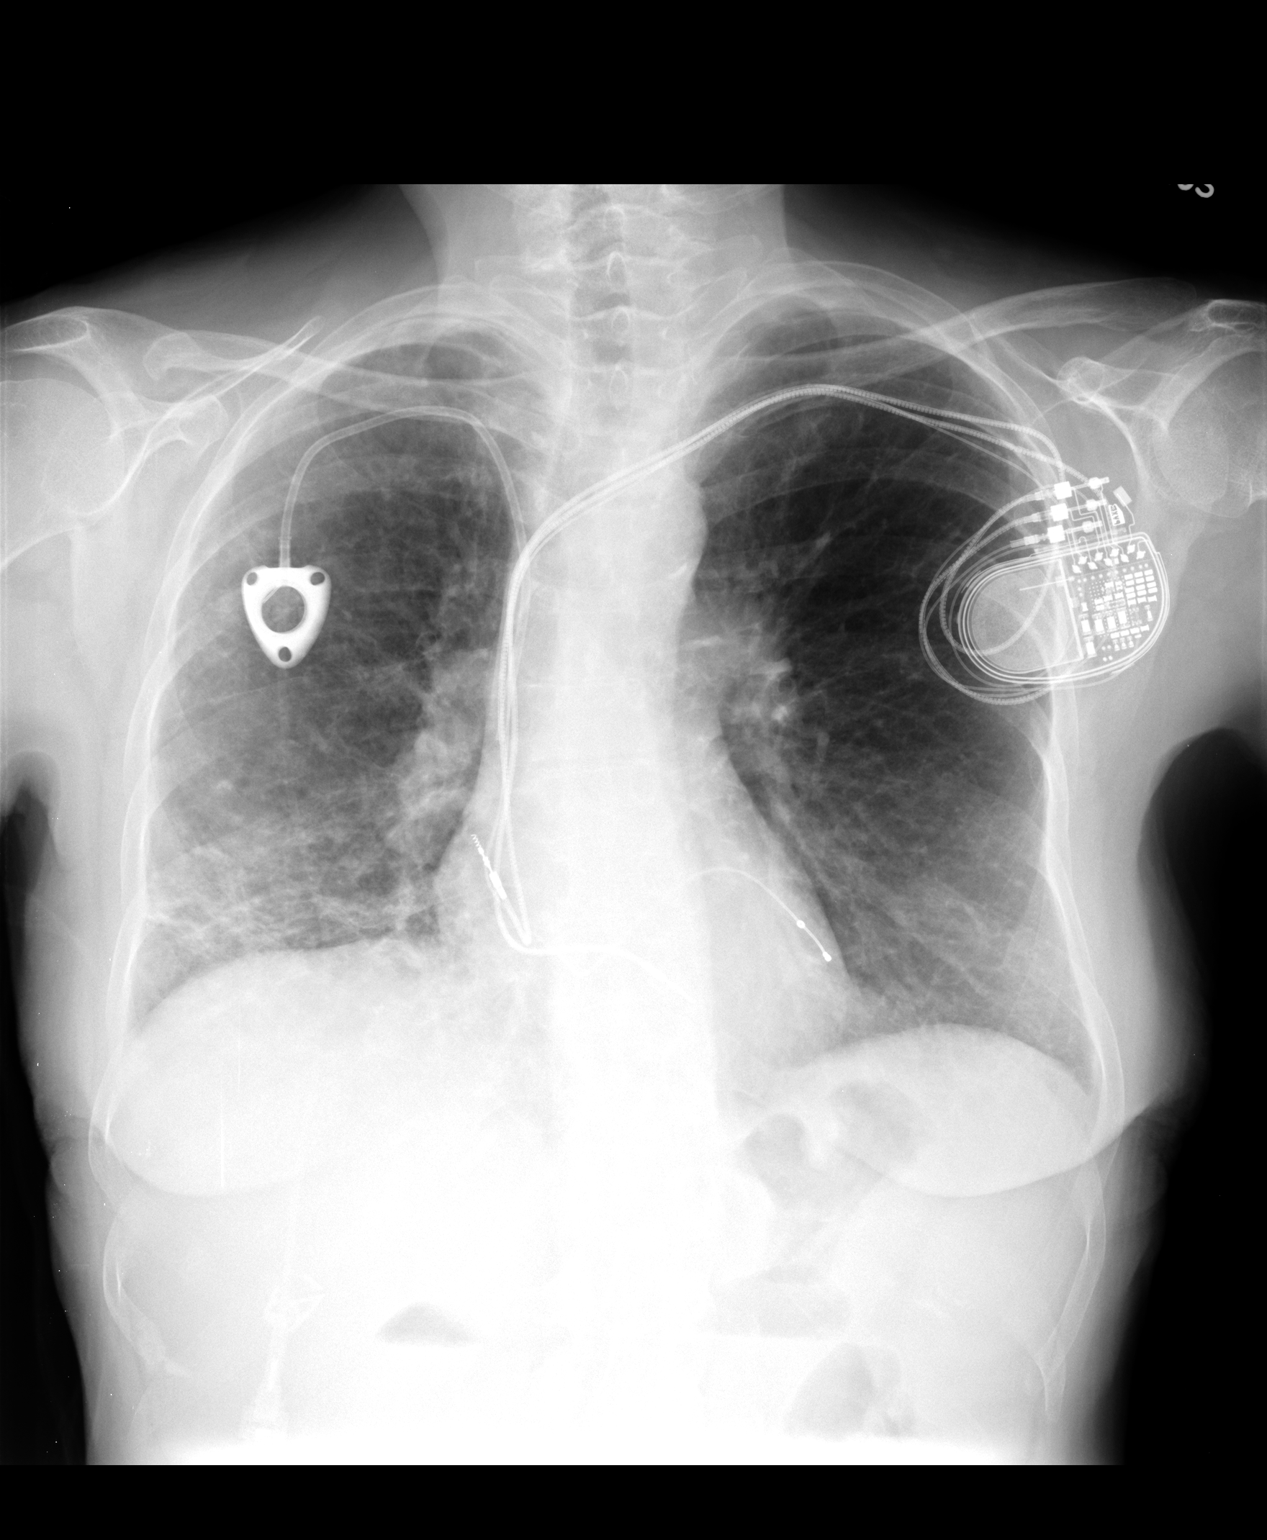

[view not recorded (2 of 2)]
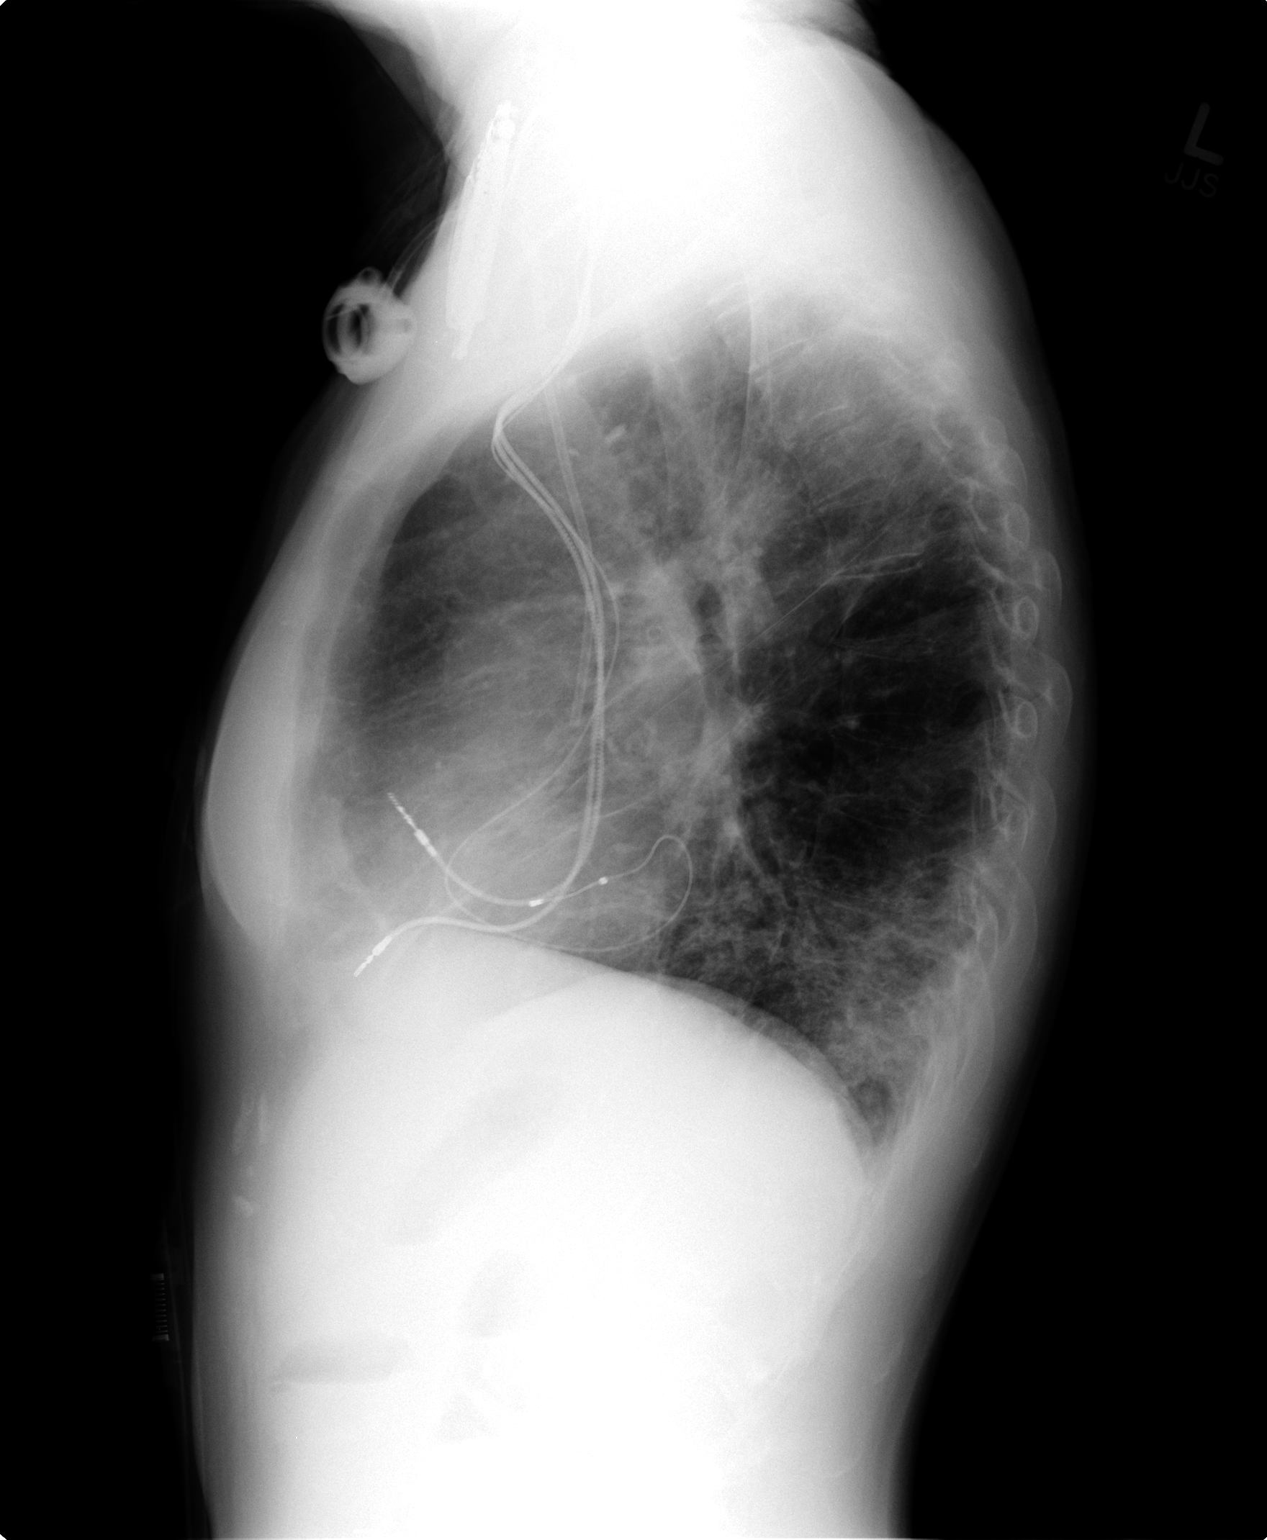

[2 of 2 positions shown; findings below may reference images not displayed]

FINDINGS: The interstitial and subtle airspace opacity the right
base is stable to potentially minimally improved since the prior
study.  Compared to 03/14/2011, and shows more substantial
improvement.  This may represent some residual of the previous
disease or evolving scar.  Left lung is hyperexpanded but clear.
Interstitial markings are diffusely coarsened with chronic
features.  Right Port-A-Cath and left permanent pacemaker remain in
place.  Bones are diffusely demineralized.
IMPRESSION: No substantial interval change exam.  See above.

## 2012-02-05 SURGERY — INSERTION, SACRAL NERVE STIMULATOR, INTERSTIM, STAGE 1
Anesthesia: General | Site: Back | Wound class: Clean

## 2012-02-05 MED ORDER — MIDAZOLAM HCL 5 MG/5ML IJ SOLN
INTRAMUSCULAR | Status: DC | PRN
  Administered 2012-02-05 (×4): 1 mg via INTRAVENOUS

## 2012-02-05 MED ORDER — LACTATED RINGERS IV SOLN
INTRAVENOUS | Status: DC
  Administered 2012-02-05 (×2): via INTRAVENOUS

## 2012-02-05 MED ORDER — CEFAZOLIN SODIUM-DEXTROSE 2-3 GM-% IV SOLR
2.0000 g | INTRAVENOUS | Status: AC
  Administered 2012-02-05: 2 g via INTRAVENOUS

## 2012-02-05 MED ORDER — LIDOCAINE-EPINEPHRINE (PF) 1 %-1:200000 IJ SOLN
INTRAMUSCULAR | Status: DC | PRN
  Administered 2012-02-05: 7.5 mL

## 2012-02-05 MED ORDER — LIDOCAINE-EPINEPHRINE (PF) 1 %-1:200000 IJ SOLN
INTRAMUSCULAR | Status: AC
  Filled 2012-02-05: qty 10

## 2012-02-05 MED ORDER — ACETAMINOPHEN 10 MG/ML IV SOLN
INTRAVENOUS | Status: DC | PRN
  Administered 2012-02-05: 1000 mg via INTRAVENOUS

## 2012-02-05 MED ORDER — FENTANYL CITRATE 0.05 MG/ML IJ SOLN
INTRAMUSCULAR | Status: DC | PRN
  Administered 2012-02-05 (×2): 50 ug via INTRAVENOUS

## 2012-02-05 MED ORDER — BUPIVACAINE-EPINEPHRINE 0.5% -1:200000 IJ SOLN
INTRAMUSCULAR | Status: DC | PRN
  Administered 2012-02-05: 7.5 mL

## 2012-02-05 MED ORDER — BUPIVACAINE-EPINEPHRINE (PF) 0.5% -1:200000 IJ SOLN
INTRAMUSCULAR | Status: AC
  Filled 2012-02-05: qty 10

## 2012-02-05 MED ORDER — CEPHALEXIN 500 MG PO CAPS: 500.0000 mg | ORAL_CAPSULE | Freq: Three times a day (TID) | ORAL | Status: DC

## 2012-02-05 MED ORDER — LACTATED RINGERS IV SOLN: INTRAVENOUS | Status: DC

## 2012-02-05 MED ORDER — ACETAMINOPHEN 10 MG/ML IV SOLN
INTRAVENOUS | Status: AC
  Filled 2012-02-05: qty 100

## 2012-02-05 MED ORDER — CEFAZOLIN SODIUM-DEXTROSE 2-3 GM-% IV SOLR
INTRAVENOUS | Status: AC
  Filled 2012-02-05: qty 50

## 2012-02-05 MED ORDER — LIDOCAINE HCL (CARDIAC) 20 MG/ML IV SOLN
INTRAVENOUS | Status: DC | PRN
  Administered 2012-02-05: 40 mg via INTRAVENOUS

## 2012-02-05 MED ORDER — HYDROCODONE-ACETAMINOPHEN 5-500 MG PO TABS: 1.0000 | ORAL_TABLET | Freq: Four times a day (QID) | ORAL | Status: DC | PRN

## 2012-02-05 MED ORDER — FENTANYL CITRATE 0.05 MG/ML IJ SOLN
25.0000 ug | INTRAMUSCULAR | Status: DC | PRN
  Administered 2012-02-05 (×2): 50 ug via INTRAVENOUS

## 2012-02-05 MED ORDER — PROMETHAZINE HCL 25 MG/ML IJ SOLN: 6.2500 mg | INTRAMUSCULAR | Status: DC | PRN

## 2012-02-05 MED ORDER — FENTANYL CITRATE 0.05 MG/ML IJ SOLN
INTRAMUSCULAR | Status: AC
  Filled 2012-02-05: qty 2

## 2012-02-05 MED ORDER — PROPOFOL INFUSION 10 MG/ML OPTIME
INTRAVENOUS | Status: DC | PRN
  Administered 2012-02-05: 120 ug/kg/min via INTRAVENOUS
  Administered 2012-02-05: 140 ug/kg/min via INTRAVENOUS

## 2012-02-05 SURGICAL SUPPLY — 42 items
ADH SKN CLS APL DERMABOND .7 (GAUZE/BANDAGES/DRESSINGS) ×1
APL SKNCLS STERI-STRIP NONHPOA (GAUZE/BANDAGES/DRESSINGS) ×1
BENZOIN TINCTURE PRP APPL 2/3 (GAUZE/BANDAGES/DRESSINGS) ×2 IMPLANT
BLADE HEX COATED 2.75 (ELECTRODE) ×2 IMPLANT
BLADE SURG SZ10 CARB STEEL (BLADE) ×4 IMPLANT
CANISTER SUCTION 2500CC (MISCELLANEOUS) ×2 IMPLANT
CLOTH BEACON ORANGE TIMEOUT ST (SAFETY) ×2 IMPLANT
COVER SURGICAL LIGHT HANDLE (MISCELLANEOUS) ×2 IMPLANT
DECANTER SPIKE VIAL GLASS SM (MISCELLANEOUS) ×2 IMPLANT
DERMABOND ADVANCED (GAUZE/BANDAGES/DRESSINGS) ×1
DERMABOND ADVANCED .7 DNX12 (GAUZE/BANDAGES/DRESSINGS) ×1 IMPLANT
DRAPE C-ARM 42X72 X-RAY (DRAPES) ×2 IMPLANT
DRAPE CAMERA CLOSED 9X96 (DRAPES) ×2 IMPLANT
DRAPE INCISE 23X17 IOBAN STRL (DRAPES) ×1
DRAPE INCISE IOBAN 23X17 STRL (DRAPES) ×1 IMPLANT
DRAPE LAPAROSCOPIC ABDOMINAL (DRAPES) ×2 IMPLANT
DRAPE LG THREE QUARTER DISP (DRAPES) ×2 IMPLANT
DRSG TEGADERM 2-3/8X2-3/4 SM (GAUZE/BANDAGES/DRESSINGS) ×4 IMPLANT
DRSG TEGADERM 4X4.75 (GAUZE/BANDAGES/DRESSINGS) ×2 IMPLANT
GAUZE SPONGE 4X4 16PLY XRAY LF (GAUZE/BANDAGES/DRESSINGS) ×2 IMPLANT
GLOVE BIO SURGEON STRL SZ7.5 (GLOVE) ×2 IMPLANT
GOWN STRL REIN XL XLG (GOWN DISPOSABLE) ×2 IMPLANT
INTRODUCER GUIDE DILATR SHEATH (SET/KITS/TRAYS/PACK) ×4 IMPLANT
KIT BASIN OR (CUSTOM PROCEDURE TRAY) ×2 IMPLANT
LEAD (Lead) ×2 IMPLANT
NEEDLE FORAMEN 20GA 3.5  9CM (NEEDLE) IMPLANT
NEEDLE FORAMEN 20GA 5  12.5CM (NEEDLE) IMPLANT
NEEDLE HYPO 25X1 1.5 SAFETY (NEEDLE) IMPLANT
NS IRRIG 1000ML POUR BTL (IV SOLUTION) ×2 IMPLANT
PACK GENERAL/GYN (CUSTOM PROCEDURE TRAY) ×2 IMPLANT
PAD TELFA 2X3 NADH STRL (GAUZE/BANDAGES/DRESSINGS) ×2 IMPLANT
SPONGE GAUZE 4X4 12PLY (GAUZE/BANDAGES/DRESSINGS) ×2 IMPLANT
SUT ETHILON 3 0 PS 1 (SUTURE) IMPLANT
SUT PROLENE 2 0 CT2 30 (SUTURE) IMPLANT
SUT SILK 2 0 (SUTURE) ×2
SUT SILK 2 0 SH (SUTURE) IMPLANT
SUT SILK 2-0 18XBRD TIE 12 (SUTURE) ×1 IMPLANT
SUT VIC AB 2-0 UR6 27 (SUTURE) ×4 IMPLANT
SYR CONTROL 10ML LL (SYRINGE) ×2 IMPLANT
TOWEL OR 17X26 10 PK STRL BLUE (TOWEL DISPOSABLE) ×2 IMPLANT
TRAY FOLEY CATH 14FRSI W/METER (CATHETERS) ×2 IMPLANT
WATER STERILE IRR 1500ML POUR (IV SOLUTION) ×2 IMPLANT

## 2012-02-05 NOTE — Anesthesia Preprocedure Evaluation (Addendum)
Anesthesia Evaluation  Patient identified by MRN, date of birth, ID band Patient awake    Reviewed: Allergy & Precautions, H&P , NPO status , Patient's Chart, lab work & pertinent test results  History of Anesthesia Complications (+) DIFFICULT IV STICK / SPECIAL LINENegative for: history of anesthetic complications  Airway Mallampati: II TM Distance: >3 FB Neck ROM: Full    Dental  (+) Teeth Intact and Dental Advisory Given   Pulmonary neg pulmonary ROS, shortness of breath, pneumonia -, COPD oxygen dependent,  breath sounds clear to auscultation  Pulmonary exam normal       Cardiovascular hypertension, Pt. on medications + CAD and +CHF + dysrhythmias Atrial Fibrillation + pacemaker Rhythm:Regular Rate:Normal     Neuro/Psych PSYCHIATRIC DISORDERS Chronic pain  Neuromuscular disease negative neurological ROS  negative psych ROS   GI/Hepatic Neg liver ROS, GERD-  Medicated and Controlled,(+) Hepatitis -, Unspecified  Endo/Other  negative endocrine ROS  Renal/GU negative Renal ROS  negative genitourinary   Musculoskeletal negative musculoskeletal ROS (+) Arthritis -, Osteoarthritis,  Fibromyalgia -, narcotic dependent  Abdominal   Peds negative pediatric ROS (+)  Hematology negative hematology ROS (+)   Anesthesia Other Findings   Reproductive/Obstetrics negative OB ROS                           Anesthesia Physical Anesthesia Plan  ASA: III  Anesthesia Plan: MAC   Post-op Pain Management:    Induction: Intravenous  Airway Management Planned: Simple Face Mask  Additional Equipment:   Intra-op Plan:   Post-operative Plan: Extubation in OR  Informed Consent: I have reviewed the patients History and Physical, chart, labs and discussed the procedure including the risks, benefits and alternatives for the proposed anesthesia with the patient or authorized representative who has indicated  his/her understanding and acceptance.   Dental advisory given  Plan Discussed with: CRNA  Anesthesia Plan Comments:       Anesthesia Quick Evaluation

## 2012-02-05 NOTE — Progress Notes (Signed)
Pt ambulated to Br without difficulty, not able to void. Notified Dr Sherron Monday, Received order to D/c home & if not able to void by am call office. IV removed, cath intact, site unremarkable.

## 2012-02-05 NOTE — Progress Notes (Signed)
Spoke with Dr Sherron Monday & pt may be d/c home after voids.

## 2012-02-05 NOTE — Progress Notes (Signed)
Dr. Rica Mast states OK to do foot sticks to obtain labs as needed.

## 2012-02-05 NOTE — Transfer of Care (Signed)
Immediate Anesthesia Transfer of Care Note  Patient: [name]  Procedure(s) Performed: Procedure(s) (LRB) with comments: INTERSTIM IMPLANT FIRST STAGE (N/A) - Replacement of Interstim Lead     Patient Location: PACU  Anesthesia Type:MAC  Level of Consciousness: awake, alert  and oriented  Airway & Oxygen Therapy: Patient Spontanous Breathing and Patient connected to face mask oxygen  Post-op Assessment: Report given to PACU RN, Post -op Vital signs reviewed and stable and Patient moving all extremities  Post vital signs: Reviewed and stable  Complications: No apparent anesthesia complications

## 2012-02-05 NOTE — Op Note (Signed)
Preoperative diagnosis: Urgency incontinence and malfunctioning InterStim Postoperative diagnosis: Malfunctioning InterStim and urgency incontinence Surgery: Placement of InterStim lead stage I; removal of previous lead Surgeon: Dr. Alfredo Martinez  The patient consented the above procedure. Preoperative antibiotics were given. Skin preparation is appropriate. Patient was under MAC anesthesia prone.  Under fluoroscopic guidance I marked the S3 foramina on her left side. she had a lead on the right side with a retained lead on her right. I easily after instilling 10 cc of a lidocaine epinephrine mixture passed a 3.5 inch framing needle through the S3 foramina on the left. She had excellent bellows and toe response.  I replaced the framing needle with the introductory lead. I made a 1 cm scalpel incision. I passed the white trocar to the appropriate depth. I removed his inner core and passed the lead to the appropriate depth using the lateral fluoroscopy. The inner wire had been removed to make the lead more flexible. She had excellent bellows and toe response at all 4 positions. Under fluoroscopic guidance I removed the white sheath and left the lead and placed  10 cc of a lidocaine epinephrine mixture were then utilized over the palpable generator on the left side. It was only 66-year-old. I opened the skin and pseudocapsule. I delivered it.  Screwdriver was utilized. I disconnected the lead from the right side and fortunately with some traction could remove it in its entirety. She still has a retained fragment on the right.  At the appropriate depth I tunneled the lead medial to lateral. Utilizing a screwdriver was hooked up to the generator. I opened the pseudocapsule minimally so with the generator with leg and freshly in nicely. Irrigation was utilized. I closed the left lateral buttock incision tension-free with running 3-0 Vicryl followed for subcuticular. My usual interrupted 4-0 Vicryl were  used medially. Sterile dressing was applied.  Patient taken to recover room.

## 2012-02-05 NOTE — Anesthesia Postprocedure Evaluation (Signed)
Anesthesia Post Note  Patient: [name]  Procedure(s) Performed: Procedure(s) (LRB): INTERSTIM IMPLANT FIRST STAGE (N/A)  Anesthesia type: MAC  Patient location: PACU  Post pain: Pain level controlled  Post assessment: Post-op Vital signs reviewed  Last Vitals:  Filed Vitals:   02/05/12 1415  BP:   Pulse: 76  Temp: 36.1 C  Resp: 22    Post vital signs: Reviewed  Level of consciousness: sedated  Complications: No apparent anesthesia complications

## 2012-02-05 NOTE — Progress Notes (Signed)
Arrived with IV in L wrist, 22g, CDI, LR at [facility]

## 2012-02-05 NOTE — Interval H&P Note (Signed)
History and Physical Interval Note:  02/05/2012 7:06 AM  [name]  has presented today for surgery, with the diagnosis of urge incontinence  The various methods of treatment have been discussed with the patient and family. After consideration of risks, benefits and other options for treatment, the patient has consented to  Procedure(s) (LRB) with comments: INTERSTIM IMPLANT FIRST STAGE (N/A) - Replacement of Interstim Lead    as a surgical intervention .  The patient's history has been reviewed, patient examined, no change in status, stable for surgery.  I have reviewed the patient's chart and labs.  Questions were answered to the patient's satisfaction.     Anab Vivar A

## 2012-02-06 LAB — FERRITIN: Ferritin: 552 ng/mL — ABNORMAL HIGH (ref 10–291)

## 2012-02-06 LAB — IRON AND TIBC
Iron: 23 ug/dL — ABNORMAL LOW (ref 42–135)
Saturation Ratios: 11 % — ABNORMAL LOW (ref 20–55)
TIBC: 211 ug/dL — ABNORMAL LOW (ref 250–470)
UIBC: 188 ug/dL (ref 125–400)

## 2012-02-06 NOTE — Telephone Encounter (Signed)
OV 01/29/12 last filled 10/31/11 # 60 no refills.  Plz advise  MW

## 2012-02-08 ENCOUNTER — Encounter (HOSPITAL_COMMUNITY): Payer: Self-pay | Admitting: Urology

## 2012-02-08 ENCOUNTER — Encounter (HOSPITAL_COMMUNITY): Payer: Self-pay

## 2012-02-11 ENCOUNTER — Ambulatory Visit (INDEPENDENT_AMBULATORY_CARE_PROVIDER_SITE_OTHER): Payer: Medicare Other | Admitting: *Deleted

## 2012-02-11 ENCOUNTER — Encounter: Payer: Self-pay | Admitting: Internal Medicine

## 2012-02-11 DIAGNOSIS — I5022 Chronic systolic (congestive) heart failure: Secondary | ICD-10-CM

## 2012-02-11 LAB — PACEMAKER DEVICE OBSERVATION
AL AMPLITUDE: 0.9 mv
AL THRESHOLD: 0.5 V
BAMS-0001: 170 {beats}/min
RV LEAD AMPLITUDE: 8.5 mv
RV LEAD THRESHOLD: 0.625 V

## 2012-02-11 NOTE — Progress Notes (Signed)
ICM check

## 2012-02-11 NOTE — Patient Instructions (Addendum)
Return office visit 04/14/12 @ 12:00pm with the device clinic.

## 2012-02-12 IMAGING — RF IR CV CATH INJECTION
2 series · 15 of 16 positions shown · non-contrast
Comparison: None.

CLINICAL DATA: Inability to aspirate from the Port-A-Cath.

CV CATHETER INJECTION UNDER FLUOROSCOPY

[Series 1: run · 1 of 1 slices shown (1 of 2)]
[im 1/1]
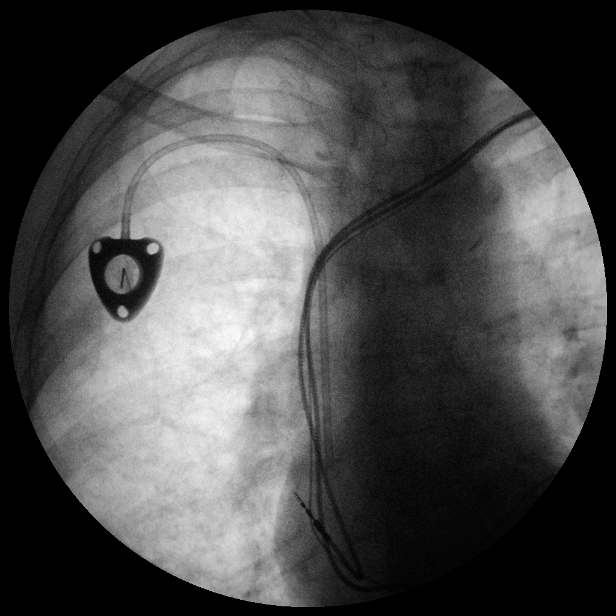

[Series 2: run · 14 of 28 slices shown (2 of 2)]
[im 1/28]
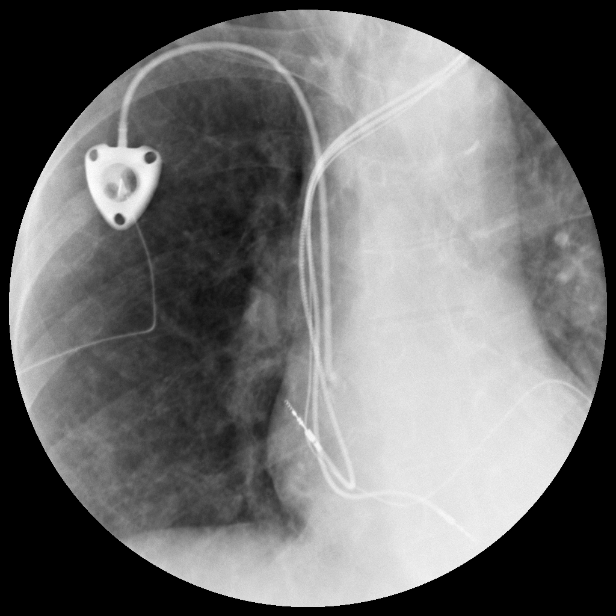
[im 2/28]
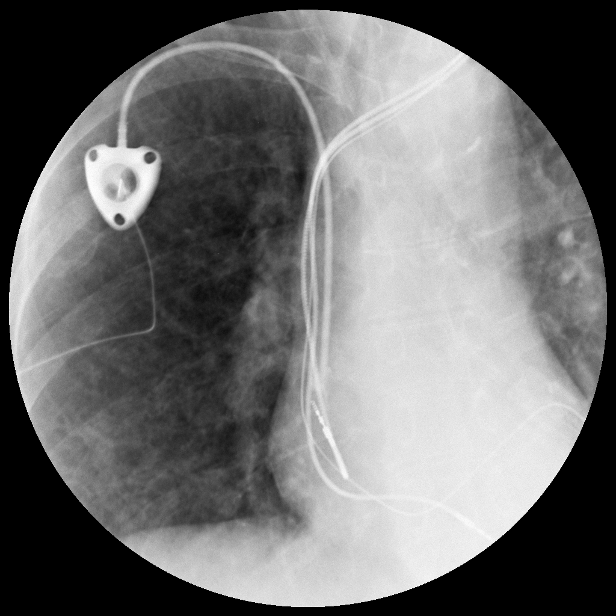
[im 4/28]
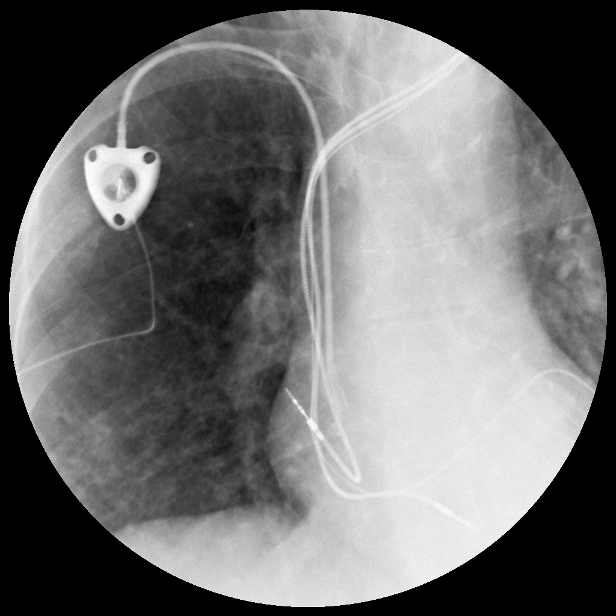
[im 6/28]
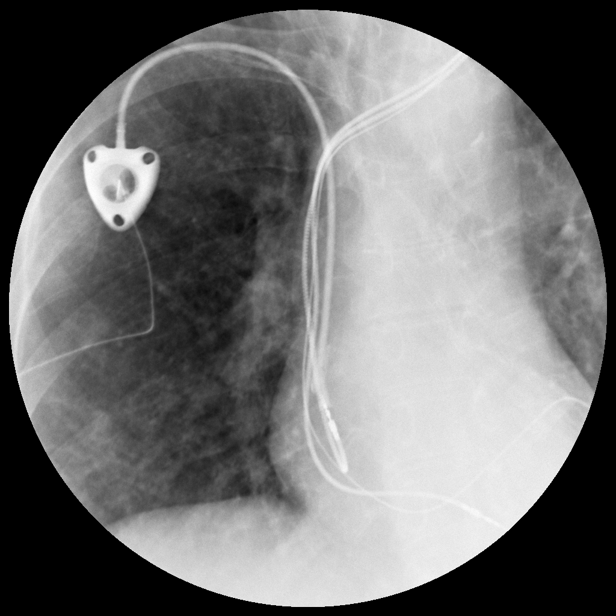
[im 8/28]
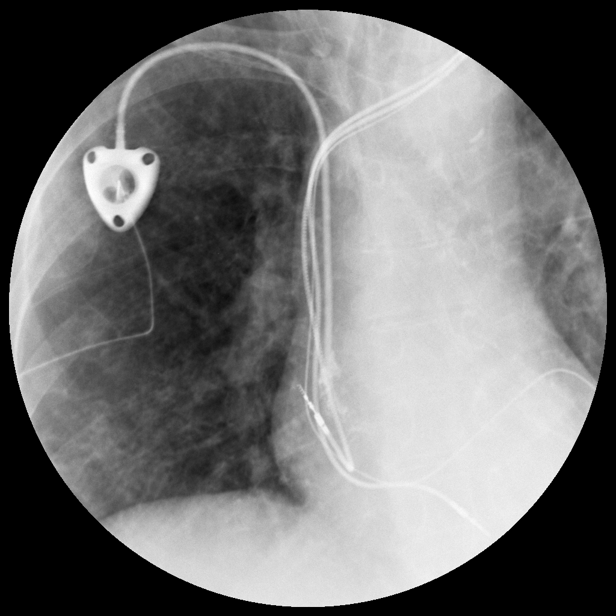
[im 10/28]
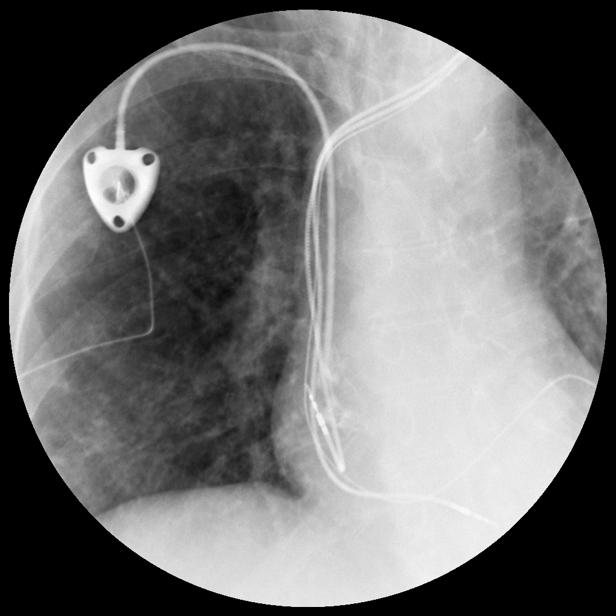
[im 14/28]
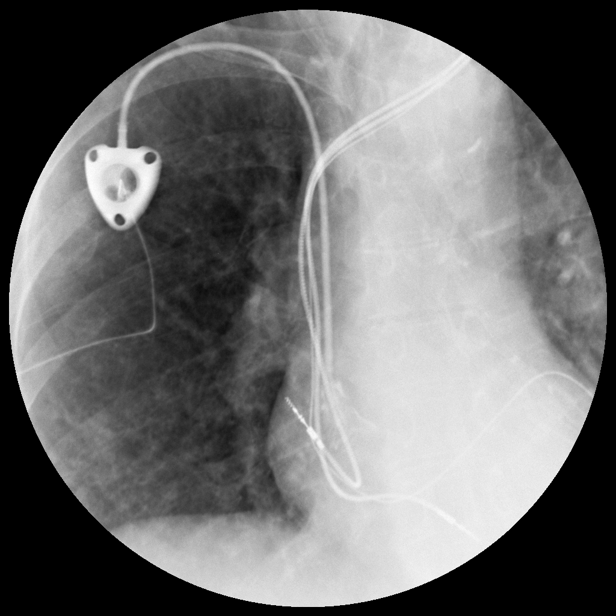
[im 16/28]
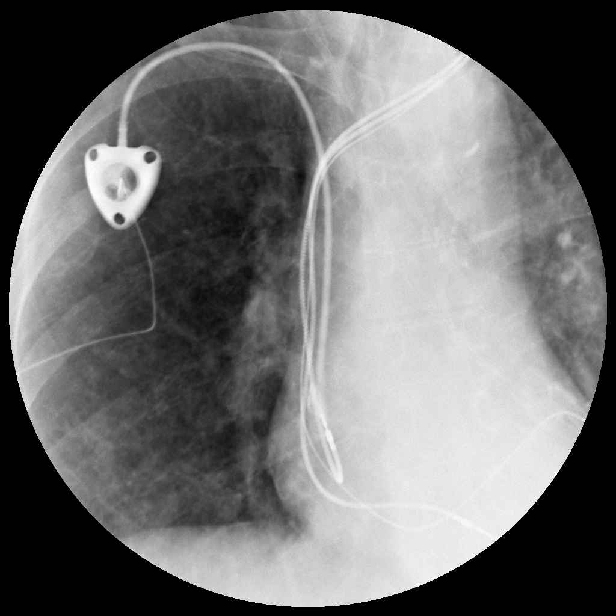
[im 18/28]
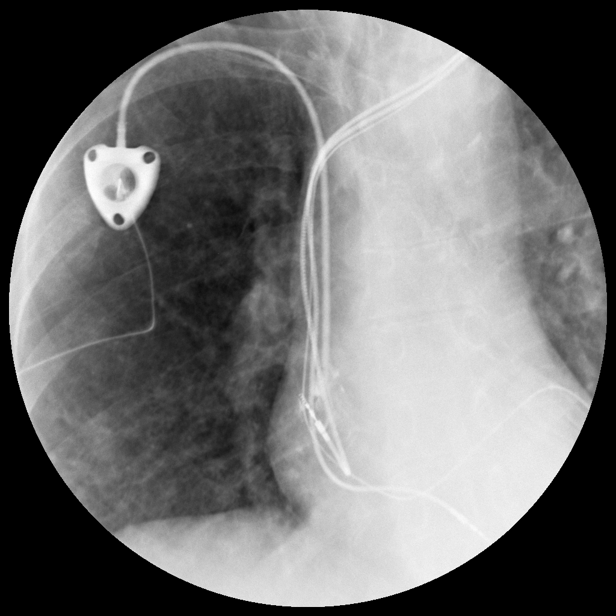
[im 20/28]
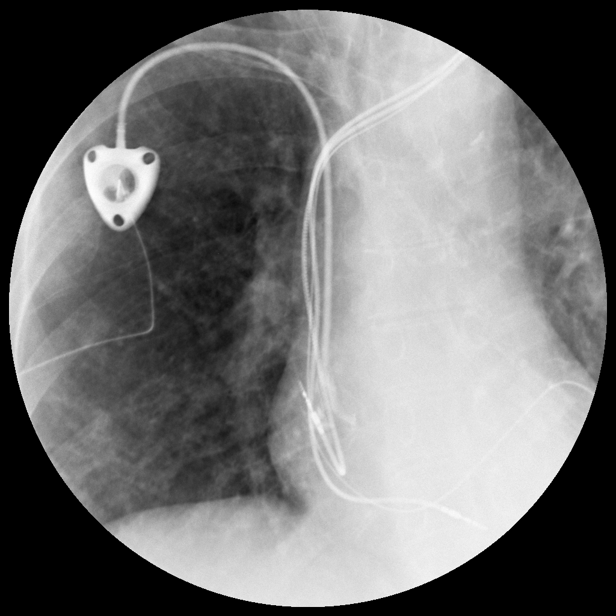
[im 22/28]
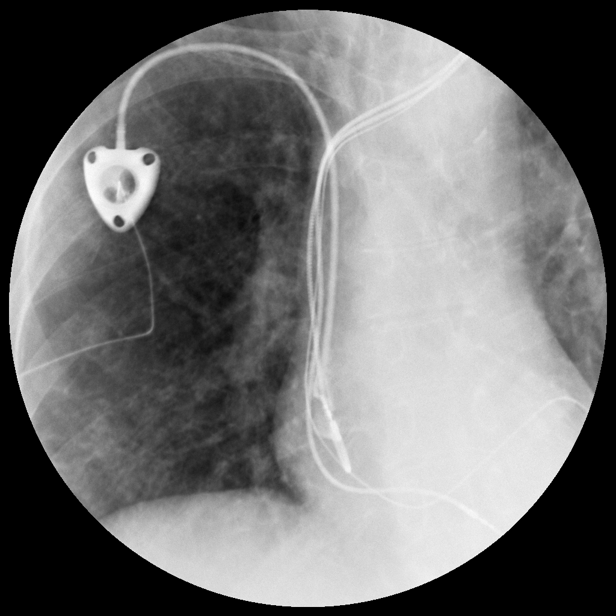
[im 24/28]
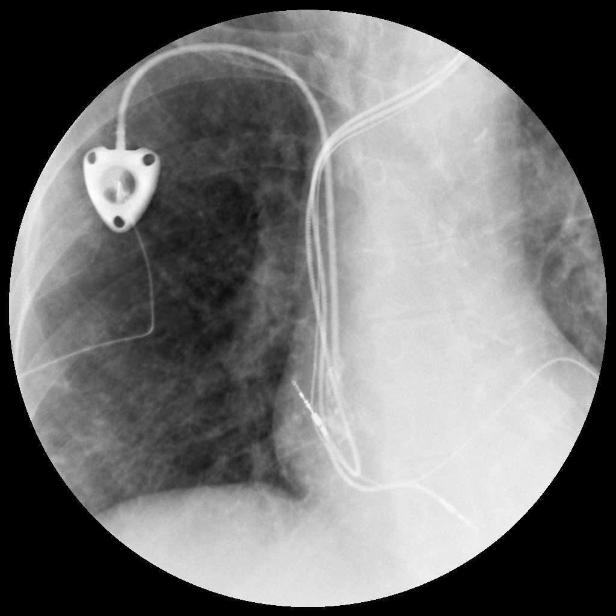
[im 26/28]
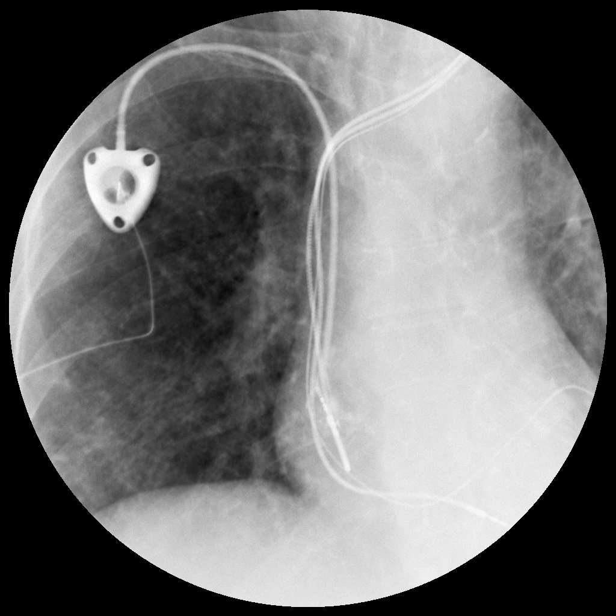
[im 28/28]
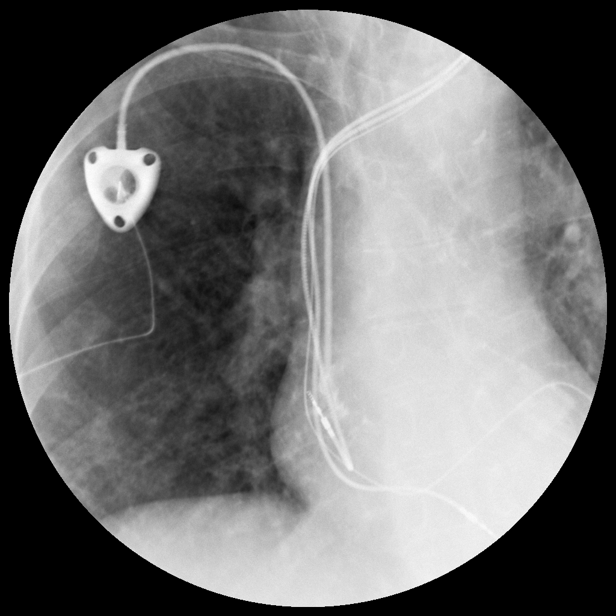

[15 of 16 positions shown; findings below may reference images not displayed]

Contrast: 5 ml Tmnipaque-B66 IV of the Port-A-Cath.

Procedure: Initial spot image demonstrates the Port-A-Cath tip at
the cavoatrial junction.  Hand injection of contrast shows no
evidence of a fibrin sheath.  Contrast flows normally from the tip
of the catheter.

Fluoroscopy time: 20 seconds, pulsed fluoroscopy.
IMPRESSION: No evidence of fibrin sheath involving the tip of the Port-A-Cath.

## 2012-02-17 ENCOUNTER — Other Ambulatory Visit: Payer: Self-pay | Admitting: Family Medicine

## 2012-02-18 ENCOUNTER — Other Ambulatory Visit: Payer: Self-pay

## 2012-02-18 DIAGNOSIS — R52 Pain, unspecified: Secondary | ICD-10-CM

## 2012-02-18 MED ORDER — FENTANYL 50 MCG/HR TD PT72: 1.0000 | MEDICATED_PATCH | TRANSDERMAL | Status: DC

## 2012-02-18 MED ORDER — OXYCODONE-ACETAMINOPHEN 10-325 MG PO TABS: 1.0000 | ORAL_TABLET | ORAL | Status: DC | PRN

## 2012-02-18 NOTE — Telephone Encounter (Signed)
Percocet# 60 only ; Fentanyl OK

## 2012-02-18 NOTE — Telephone Encounter (Signed)
Pt LMOVM requesting 2 Rx -OV 02/01/12 Last filled fentanyl patch 01/02/12 10 patches and Oxycodone 01/21/12 # 120 no refills plz advise   MW

## 2012-02-18 NOTE — Telephone Encounter (Signed)
msg left advising patient Rx ready for pick up.     KP

## 2012-02-20 ENCOUNTER — Other Ambulatory Visit: Payer: Self-pay | Admitting: Cardiology

## 2012-02-26 IMAGING — CR DG CHEST 2V
2 series · 2 of 2 positions shown · non-contrast
Comparison: 04/09/2011

CLINICAL DATA: Pneumonia, cough, COPD.

CHEST - 2 VIEW

[view not recorded (1 of 2)]
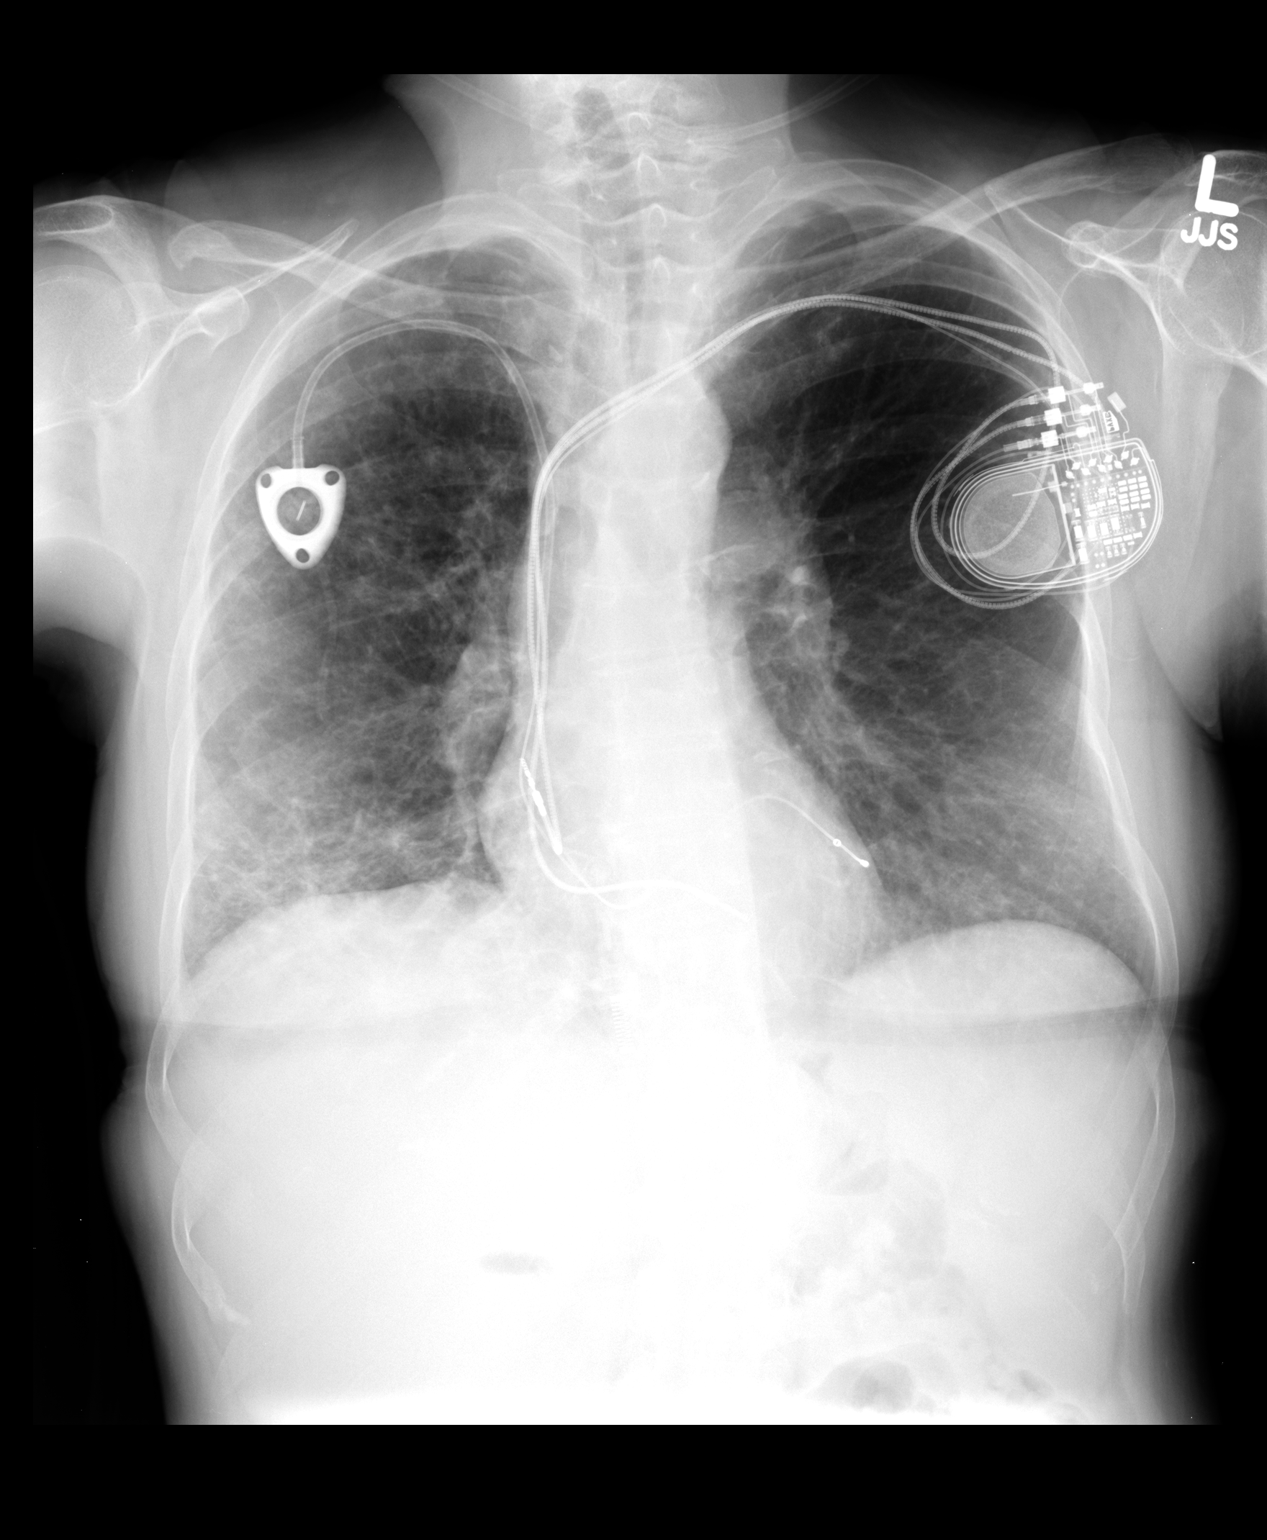

[view not recorded (2 of 2)]
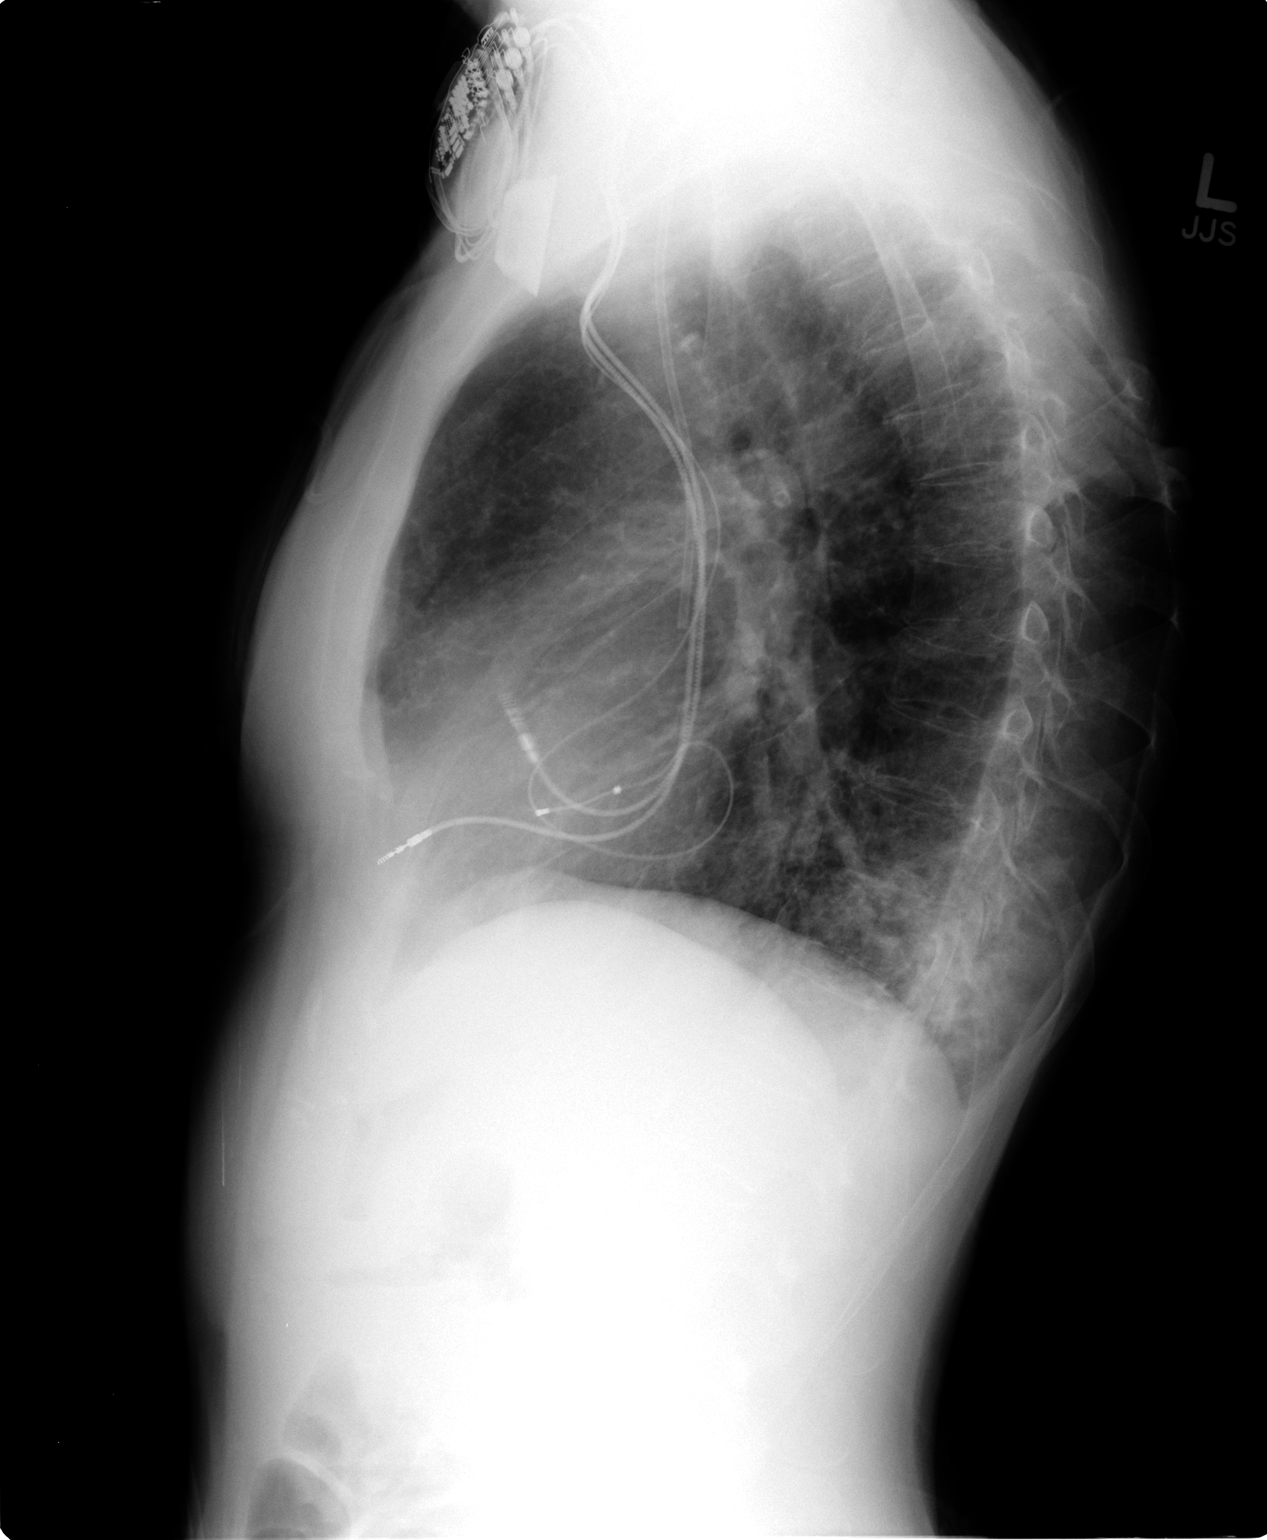

[2 of 2 positions shown; findings below may reference images not displayed]

FINDINGS: Left pacer and right Port-A-Cath remain in place,
unchanged. Opacity noted posteriorly on the lateral view, likely
within the right lower lobe, similar to prior study.  No confluent
opacity on the left.  No effusions or acute bony abnormality.
Stable coarsened interstitial opacities within the lungs, likely
chronic interstitial lung disease.
IMPRESSION: Stable opacity in the right lung base, residual infiltrate versus
scar.

Stable coarsened chronic interstitial prominence.

## 2012-02-27 ENCOUNTER — Other Ambulatory Visit: Payer: Self-pay

## 2012-02-27 DIAGNOSIS — R52 Pain, unspecified: Secondary | ICD-10-CM

## 2012-02-27 MED ORDER — OXYCODONE-ACETAMINOPHEN 10-325 MG PO TABS: 1.0000 | ORAL_TABLET | ORAL | Status: DC | PRN

## 2012-02-27 NOTE — Telephone Encounter (Signed)
Message copied by Arnette Norris on Wed Feb 27, 2012 10:18 AM ------      Message from: Baldwin [place]      Created: Wed Feb 27, 2012  8:20 AM       Patient lm on triage line requesting other half of oxycodone Rx (received 60 tabs from [place]).      Thanks,             Dois Davenport

## 2012-02-27 NOTE — Telephone Encounter (Signed)
Patient aware Rx ready for pick up.      KP 

## 2012-02-27 NOTE — Telephone Encounter (Signed)
Please advise      KP 

## 2012-02-27 NOTE — Telephone Encounter (Signed)
Ok to give rx  

## 2012-03-01 IMAGING — CT CT CHEST W/O CM
2 of 3 series · 15 of 36 positions shown, 18 images · IV contrast (Omnipaque 300)
Comparison: 04/30/2011

CLINICAL DATA: Recurrent right lower lobe infiltrates

CT CHEST WITHOUT CONTRAST
TECHNIQUE: Multidetector CT imaging of the chest was performed
following the standard protocol without IV contrast.

[Series 2: chest routine with · axial · 0.61mm/px · z∈[-221,-1]mm · 12 of 52 slices shown, 15 images]
[im 4/52  mediastinal]
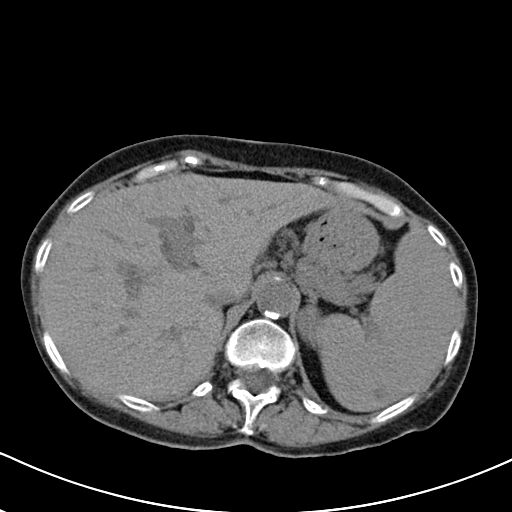
[im 4/52  lung]
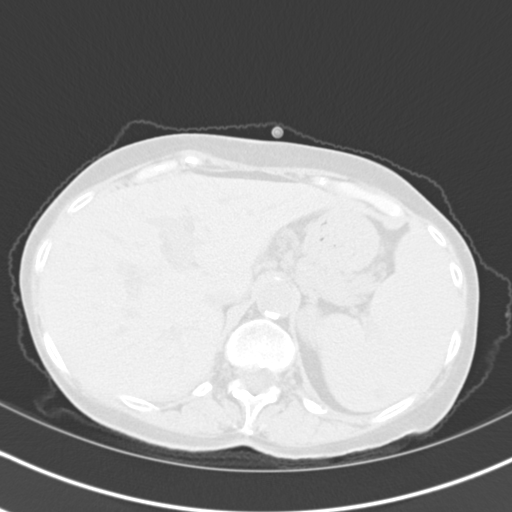
[im 8/52  lung]
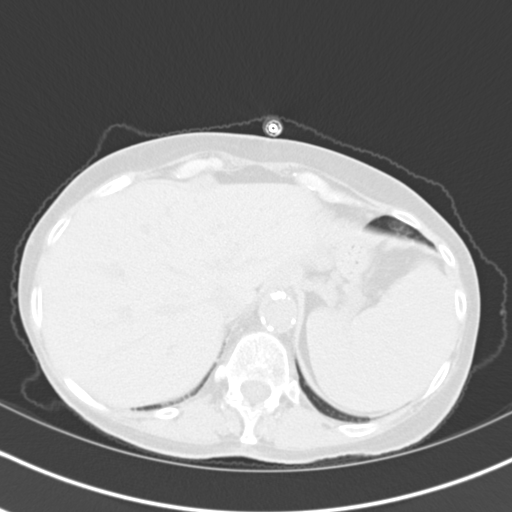
[im 12/52  lung]
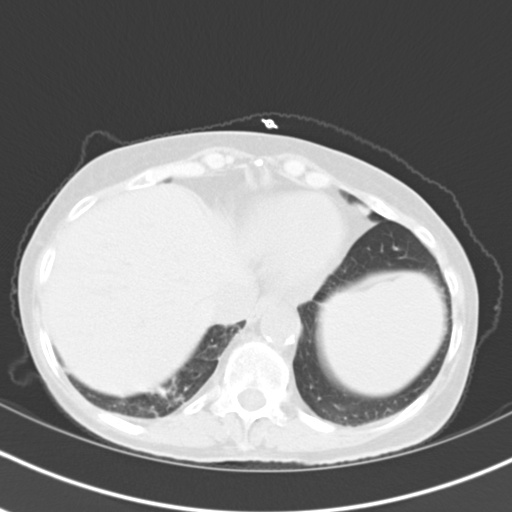
[im 16/52  lung]
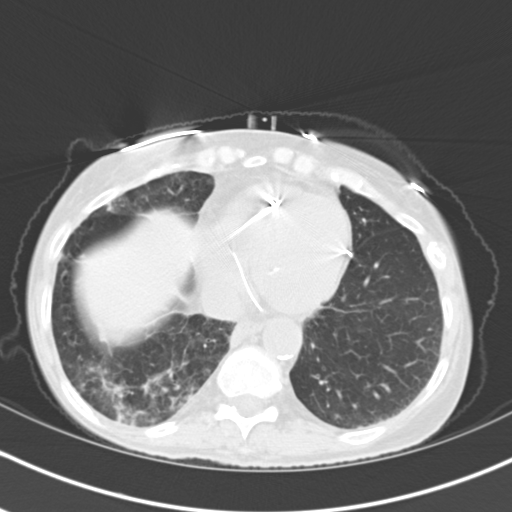
[im 19/52  mediastinal]
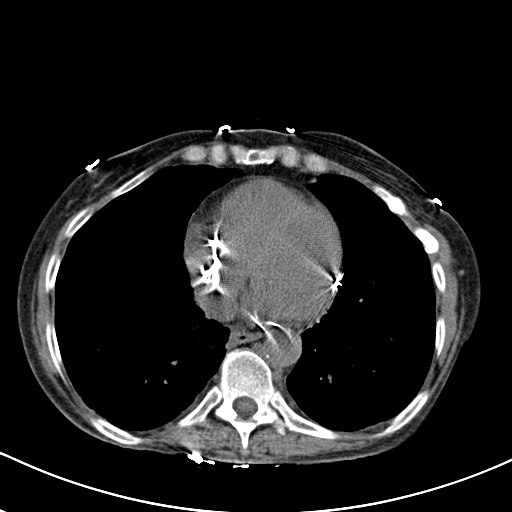
[im 19/52  lung]
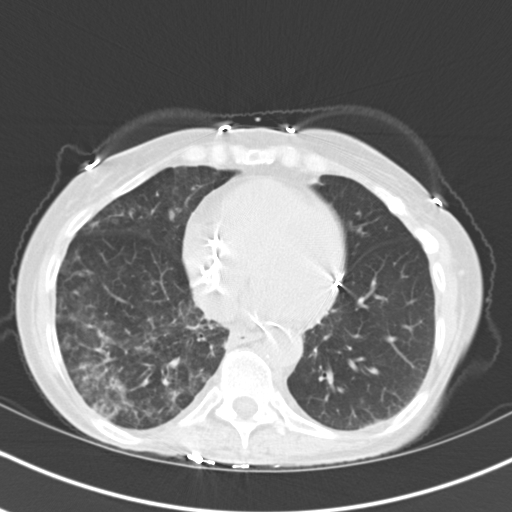
[im 23/52  lung]
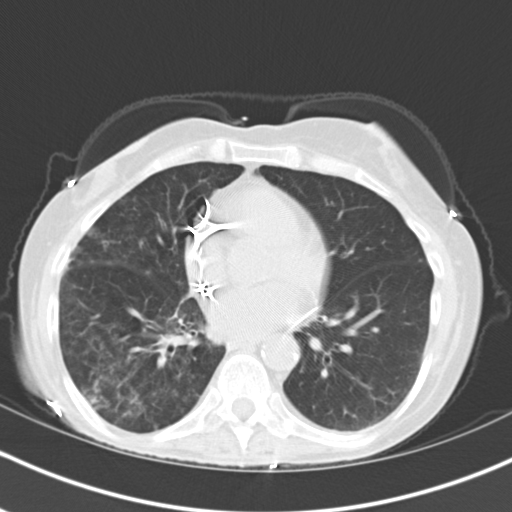
[im 29/52  lung]
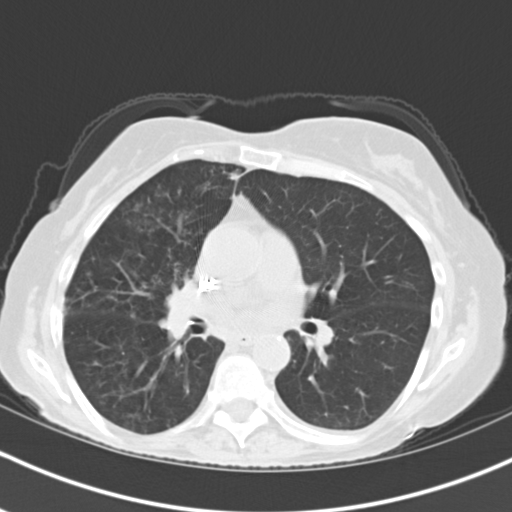
[im 33/52  lung]
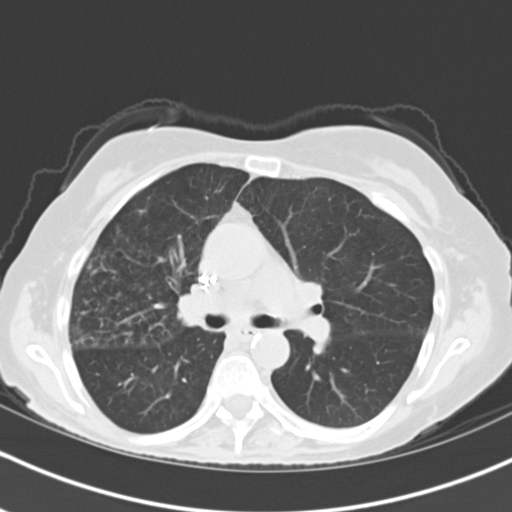
[im 36/52  mediastinal]
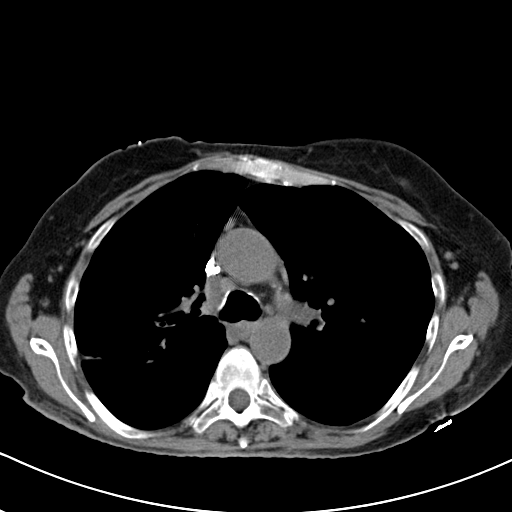
[im 36/52  lung]
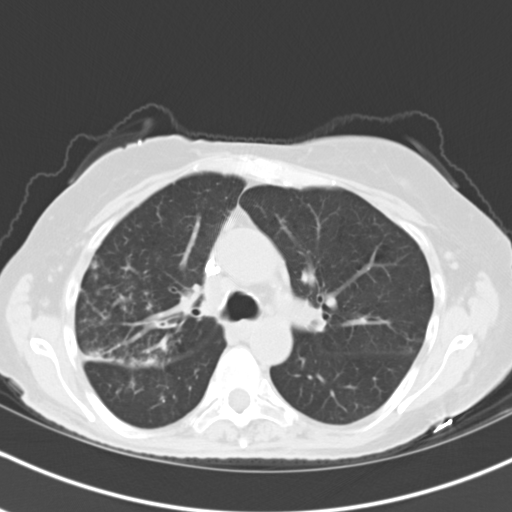
[im 40/52  lung]
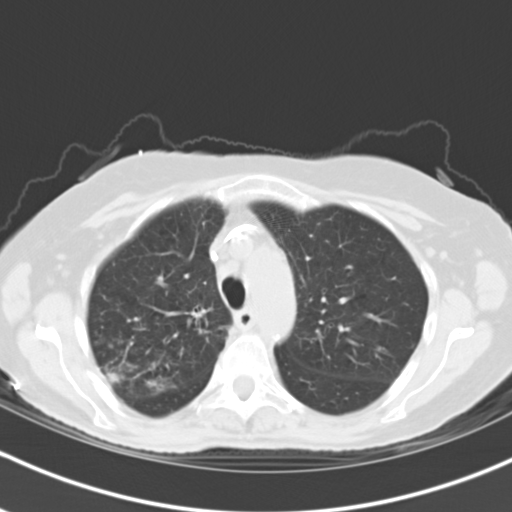
[im 44/52  lung]
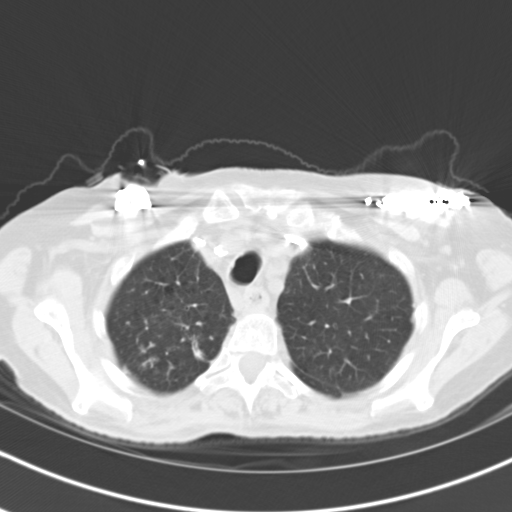
[im 48/52  lung]
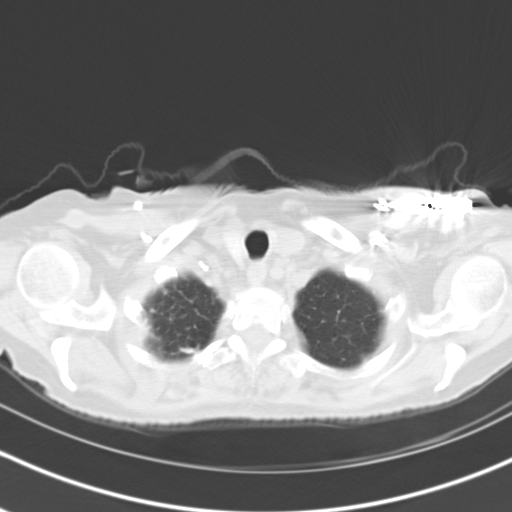

[Series 602: cor · coronal · 0.61mm/px · 3 of 93 slices shown]
[im 19/93  lung]
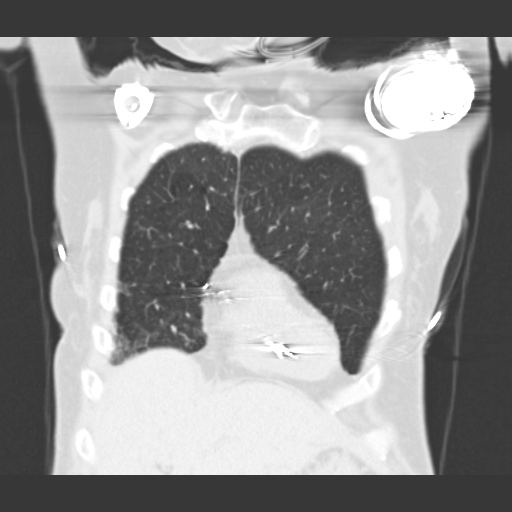
[im 37/93  lung]
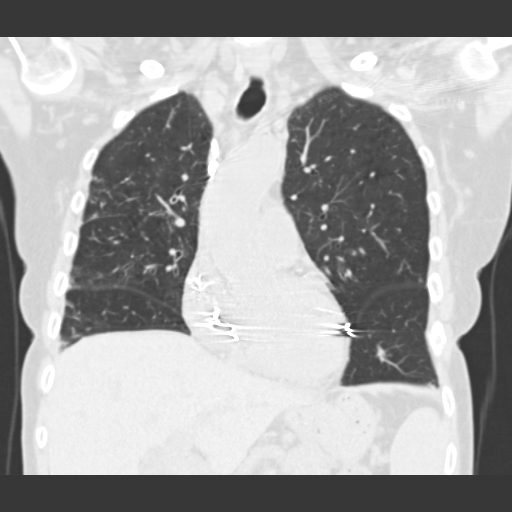
[im 56/93  lung]
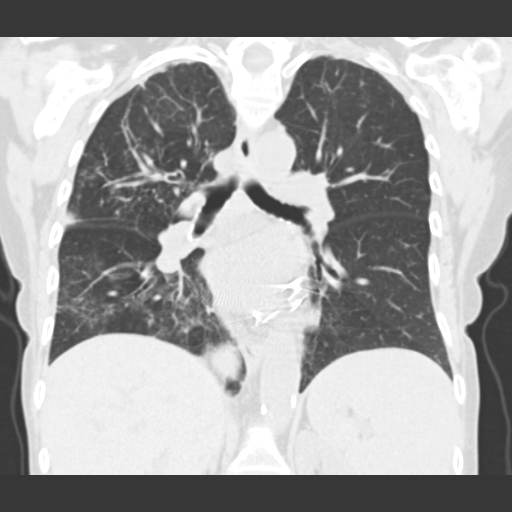

[15 of 36 positions shown; findings below may reference images not displayed]

FINDINGS: No enlarged axillary or supraclavicular lymph nodes.

Right paratracheal lymph node is stable measuring 1.1 cm.

No pericardial or pleural effusion identified.

Again noted is bilateral areas of bronchiectasis within the right
upper lobe and right middle lobe as well as the right base.

Patchy and nodular airspace densities are again noted within the
right upper lobe, right middle lobe and right lower lobe.  There
are also scattered areas of ground-glass attenuation. This is
similar to 12/24/2009.

The tracheobronchial tree appears patent.

No endobronchial lesion identified.

Stable subpleural density within the basilar portion of the left
upper lobe, image 10.

There is a mild scoliosis deformity affecting the thoracic spine
with multilevel spondylosis.

Limited imaging through the upper abdomen is unremarkable.
IMPRESSION: 1.  Chronic multifocal nodular and patchy airspace densities
throughout the right lung.  Similar in distribution to 12/24/2009.
There is also mild bronchiectasis throughout the right lung.
Differential considerations include recurrent bronchopneumonia
versus atypical infection such as GUEVARRA.
2.  No obstructing endobronchial lesion identified.
3.  Stable borderline mediastinal lymph nodes.

## 2012-03-03 ENCOUNTER — Ambulatory Visit (INDEPENDENT_AMBULATORY_CARE_PROVIDER_SITE_OTHER): Payer: Medicare Other | Admitting: Internal Medicine

## 2012-03-03 DIAGNOSIS — E538 Deficiency of other specified B group vitamins: Secondary | ICD-10-CM

## 2012-03-04 ENCOUNTER — Other Ambulatory Visit: Payer: Self-pay | Admitting: Family Medicine

## 2012-03-04 NOTE — Telephone Encounter (Signed)
Last seen 01/29/12 and filled 02/05/12 # 60. Please advise    KP

## 2012-03-05 ENCOUNTER — Other Ambulatory Visit: Payer: Self-pay | Admitting: Family Medicine

## 2012-03-05 NOTE — Telephone Encounter (Signed)
Rx re-sent   KP 

## 2012-03-18 ENCOUNTER — Other Ambulatory Visit: Payer: Self-pay | Admitting: Family Medicine

## 2012-03-18 NOTE — Telephone Encounter (Signed)
Last seen 01/29/12 and filled 02/17/12 # 60. Pease advise     KP

## 2012-03-19 ENCOUNTER — Other Ambulatory Visit: Payer: Self-pay | Admitting: Cardiology

## 2012-03-19 IMAGING — CR DG CHEST 2V
2 series · 2 of 2 positions shown · non-contrast
Comparison: Chest x-ray of 04/30/2011 and CT chest of 05/04/2011.

CLINICAL DATA: Pneumonia, cough

CHEST - 2 VIEW

[view not recorded (1 of 2)]
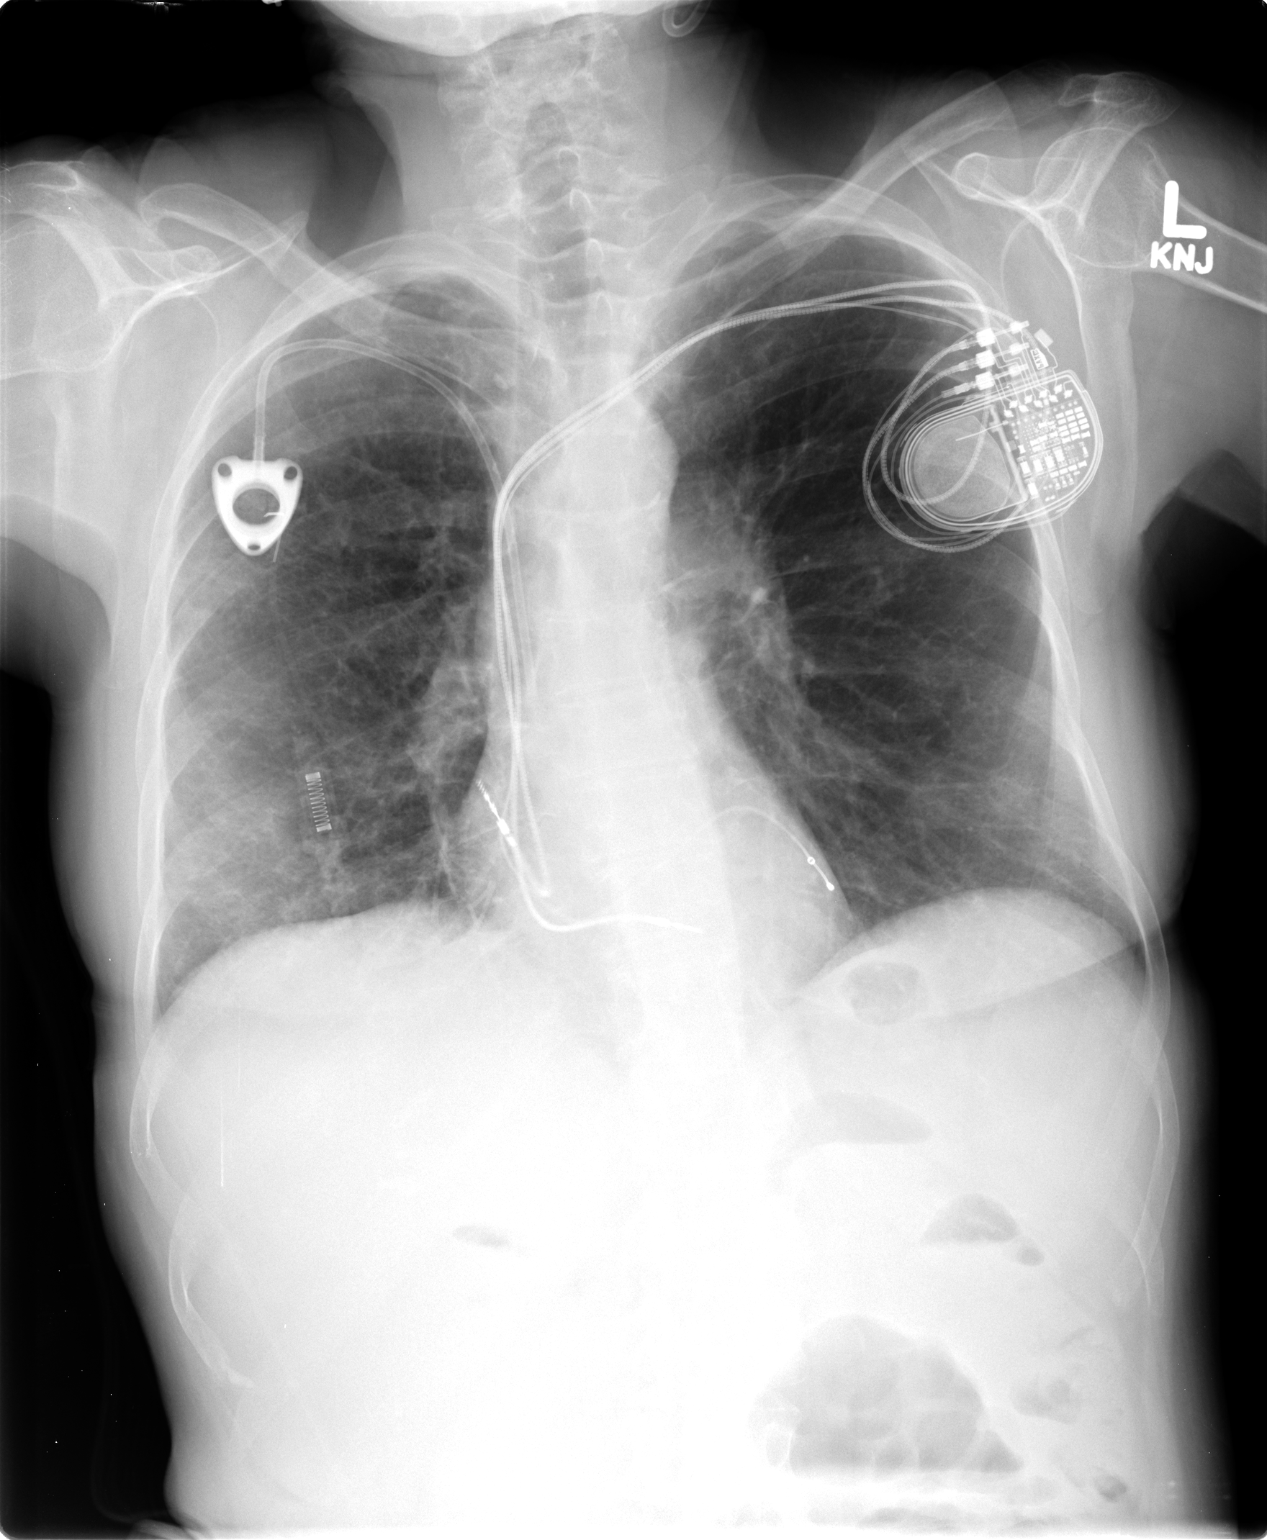

[view not recorded (2 of 2)]
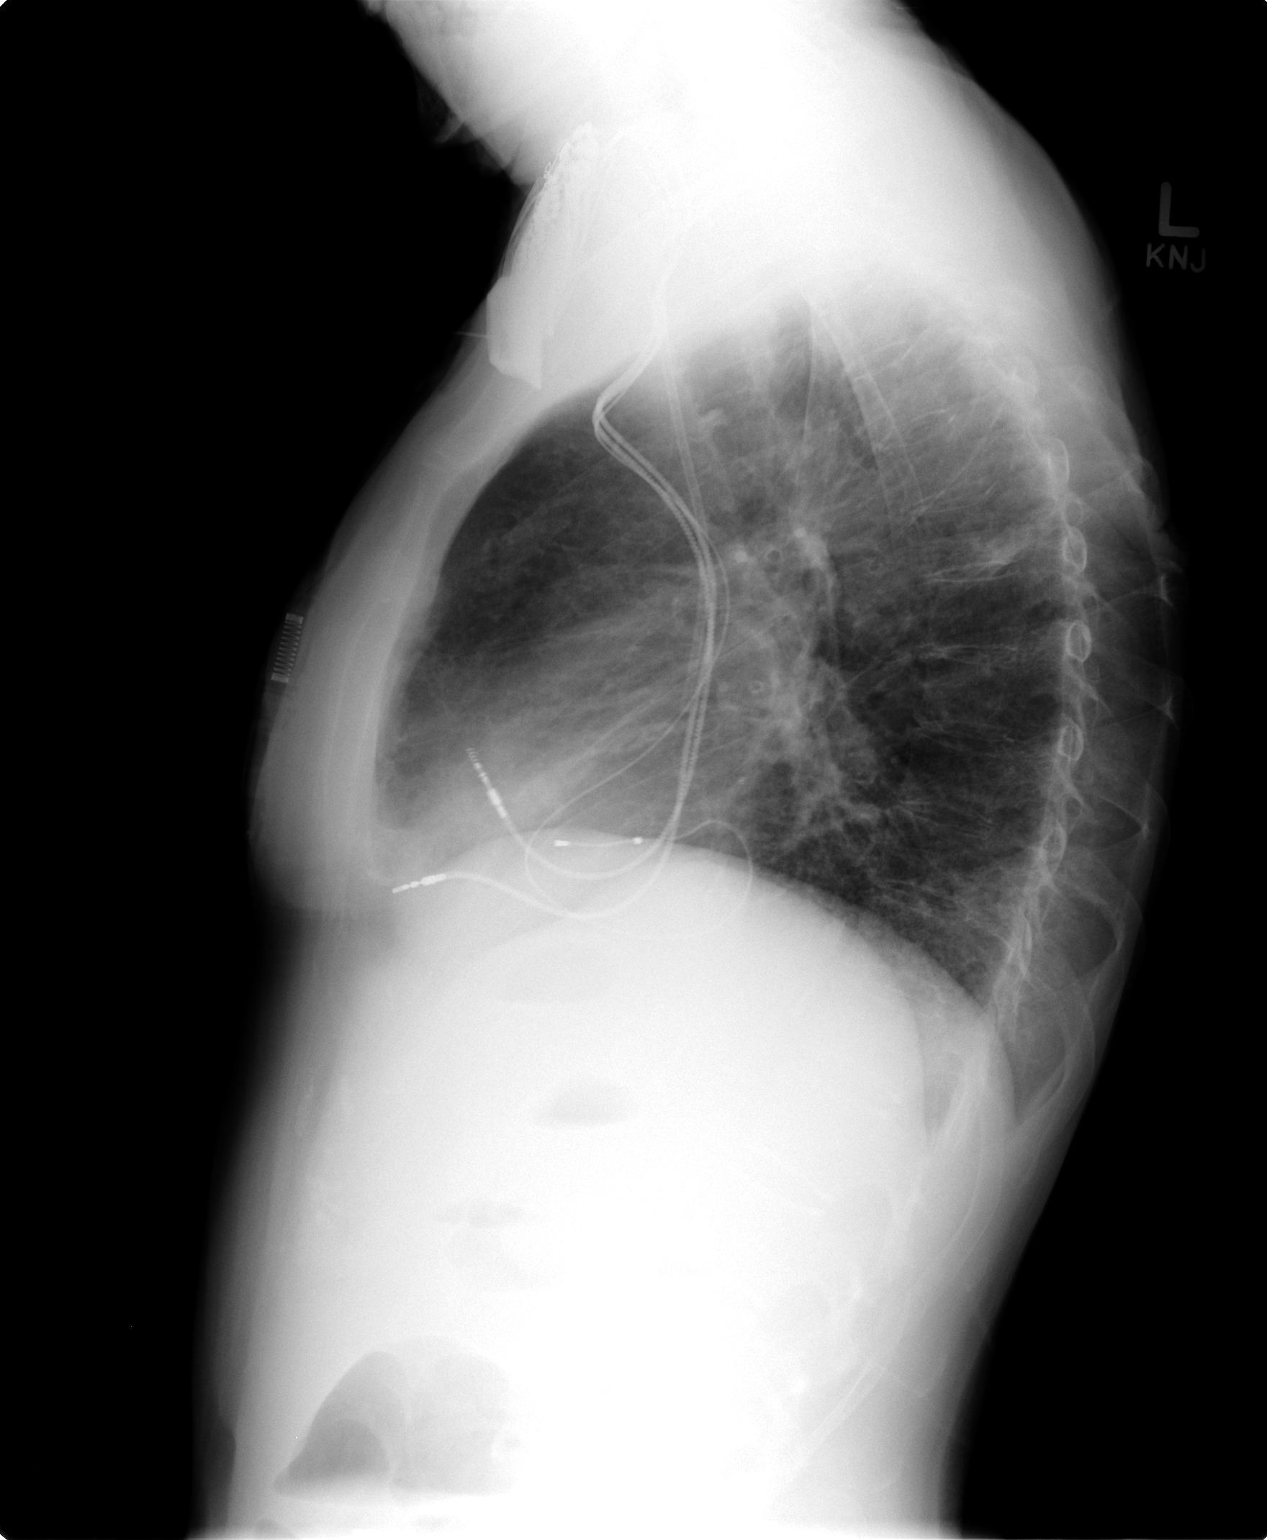

[2 of 2 positions shown; findings below may reference images not displayed]

FINDINGS: The lungs are hyperaerated.  There is some peribronchial
thickening which may indicate bronchitis.  No definite pneumonia or
effusion is seen with probable scarring at the right lung base.
The heart is within normal limits in size.  Permanent pacemaker and
Port-A-Cath remain.  The bones are osteopenic.
IMPRESSION: 1.  Probable COPD and chronic bronchitis.
2.  Pacemaker and Port-A-Cath remain.

## 2012-03-24 ENCOUNTER — Telehealth: Payer: Self-pay | Admitting: *Deleted

## 2012-03-24 DIAGNOSIS — R52 Pain, unspecified: Secondary | ICD-10-CM

## 2012-03-24 NOTE — Telephone Encounter (Signed)
Please advise      KP 

## 2012-03-24 NOTE — Telephone Encounter (Signed)
Patient requesting refills on percocet and fentanyl patches. Last OV , acute 01/29/12. Last fill for percocet 02/27/12 #60 no refills and fentanyl 02/18/12 #10 no refills

## 2012-03-24 NOTE — Telephone Encounter (Signed)
Ok to refill x1 each   

## 2012-03-26 MED ORDER — FENTANYL 50 MCG/HR TD PT72: 1.0000 | MEDICATED_PATCH | TRANSDERMAL | Status: DC

## 2012-03-26 MED ORDER — OXYCODONE-ACETAMINOPHEN 10-325 MG PO TABS: 1.0000 | ORAL_TABLET | ORAL | Status: DC | PRN

## 2012-03-26 NOTE — Telephone Encounter (Signed)
Rx printed for incorrect quantity, Rx redone with correct quantity and Pt will bring in other Rx. Rx printed.

## 2012-03-26 NOTE — Telephone Encounter (Signed)
Pt called to check status of refill. Pt notes that she is not out of med but will be in a few days. Pt aware med has been approved and will be ready for pickup today.

## 2012-03-26 NOTE — Addendum Note (Signed)
Addended by: Candie Echevaria L on: 03/26/2012 04:57 PM   Modules accepted: Orders

## 2012-03-31 ENCOUNTER — Ambulatory Visit: Payer: Medicare Other | Admitting: Pulmonary Disease

## 2012-04-03 ENCOUNTER — Other Ambulatory Visit: Payer: Self-pay | Admitting: Family Medicine

## 2012-04-03 ENCOUNTER — Ambulatory Visit (INDEPENDENT_AMBULATORY_CARE_PROVIDER_SITE_OTHER): Payer: Medicare Other | Admitting: Internal Medicine

## 2012-04-03 DIAGNOSIS — E538 Deficiency of other specified B group vitamins: Secondary | ICD-10-CM

## 2012-04-04 ENCOUNTER — Other Ambulatory Visit: Payer: Self-pay | Admitting: Family Medicine

## 2012-04-04 NOTE — Progress Notes (Signed)
b12 injection given 

## 2012-04-04 NOTE — Telephone Encounter (Signed)
Last seen 01/29/12 and filled 03/05/12 # 60. Please advise      KP

## 2012-04-11 ENCOUNTER — Encounter: Payer: Medicare Other | Admitting: Cardiology

## 2012-04-17 ENCOUNTER — Ambulatory Visit (INDEPENDENT_AMBULATORY_CARE_PROVIDER_SITE_OTHER): Payer: Medicare Other | Admitting: Cardiology

## 2012-04-17 ENCOUNTER — Encounter: Payer: Self-pay | Admitting: Cardiology

## 2012-04-17 ENCOUNTER — Other Ambulatory Visit: Payer: Self-pay | Admitting: Family Medicine

## 2012-04-17 ENCOUNTER — Encounter: Payer: Self-pay | Admitting: Internal Medicine

## 2012-04-17 DIAGNOSIS — I5022 Chronic systolic (congestive) heart failure: Secondary | ICD-10-CM

## 2012-04-17 DIAGNOSIS — I428 Other cardiomyopathies: Secondary | ICD-10-CM

## 2012-04-17 DIAGNOSIS — Z950 Presence of cardiac pacemaker: Secondary | ICD-10-CM

## 2012-04-17 LAB — PACEMAKER DEVICE OBSERVATION
AL AMPLITUDE: 1.3 mv
AL IMPEDENCE PM: 361 Ohm
AL THRESHOLD: 0.5 V
ATRIAL PACING PM: 0
BAMS-0001: 170 {beats}/min
BATTERY VOLTAGE: 3 V
LV LEAD IMPEDENCE PM: 494 Ohm
LV LEAD THRESHOLD: 0.75 V
RV LEAD AMPLITUDE: 8 mv
RV LEAD IMPEDENCE PM: 513 Ohm
RV LEAD THRESHOLD: 0.75 V
VENTRICULAR PACING PM: 99.9

## 2012-04-17 NOTE — Progress Notes (Signed)
CRT-P check/device clinic only. See [facility] report. Continue device checks every 3 months.

## 2012-04-17 NOTE — Telephone Encounter (Signed)
Last seen 01/29/12 and filled 03/18/12 # 60. Please advise     KP

## 2012-04-18 ENCOUNTER — Ambulatory Visit: Payer: Medicare Other | Admitting: Pulmonary Disease

## 2012-04-23 ENCOUNTER — Other Ambulatory Visit: Payer: Self-pay | Admitting: *Deleted

## 2012-04-23 ENCOUNTER — Ambulatory Visit (INDEPENDENT_AMBULATORY_CARE_PROVIDER_SITE_OTHER): Payer: Medicare Other | Admitting: Internal Medicine

## 2012-04-23 ENCOUNTER — Encounter: Payer: Self-pay | Admitting: Internal Medicine

## 2012-04-23 ENCOUNTER — Other Ambulatory Visit (INDEPENDENT_AMBULATORY_CARE_PROVIDER_SITE_OTHER): Payer: Medicare Other

## 2012-04-23 VITALS — BP 120/60 | HR 86 | Ht 65.0 in | Wt 101.0 lb

## 2012-04-23 DIAGNOSIS — K56609 Unspecified intestinal obstruction, unspecified as to partial versus complete obstruction: Secondary | ICD-10-CM

## 2012-04-23 DIAGNOSIS — R197 Diarrhea, unspecified: Secondary | ICD-10-CM

## 2012-04-23 DIAGNOSIS — D509 Iron deficiency anemia, unspecified: Secondary | ICD-10-CM

## 2012-04-23 DIAGNOSIS — R195 Other fecal abnormalities: Secondary | ICD-10-CM

## 2012-04-23 LAB — COMPREHENSIVE METABOLIC PANEL
ALT: 24 U/L (ref 0–35)
AST: 23 U/L (ref 0–37)
Albumin: 3.4 g/dL — ABNORMAL LOW (ref 3.5–5.2)
Alkaline Phosphatase: 119 U/L — ABNORMAL HIGH (ref 39–117)
BUN: 25 mg/dL — ABNORMAL HIGH (ref 6–23)
CO2: 34 mEq/L — ABNORMAL HIGH (ref 19–32)
Calcium: 8.4 mg/dL (ref 8.4–10.5)
Chloride: 97 mEq/L (ref 96–112)
Creatinine, Ser: 0.9 mg/dL (ref 0.4–1.2)
GFR: 68.03 mL/min (ref >60.00)
Glucose, Bld: 101 mg/dL — ABNORMAL HIGH (ref 70–99)
Potassium: 4.5 mEq/L (ref 3.5–5.1)
Sodium: 138 mEq/L (ref 135–145)
Total Bilirubin: 0.4 mg/dL (ref 0.3–1.2)
Total Protein: 6.3 g/dL (ref 6.0–8.3)

## 2012-04-23 LAB — CBC WITH DIFFERENTIAL/PLATELET
Basophils Absolute: 0 10*3/uL (ref 0.0–0.1)
Basophils Relative: 0 % (ref 0.0–3.0)
Eosinophils Absolute: 0.1 10*3/uL (ref 0.0–0.7)
Eosinophils Relative: 1.6 % (ref 0.0–5.0)
HCT: 33.8 % — ABNORMAL LOW (ref 36.0–46.0)
Hemoglobin: 11 g/dL — ABNORMAL LOW (ref 12.0–15.0)
Lymphocytes Relative: 8.4 % — ABNORMAL LOW (ref 12.0–46.0)
Lymphs Abs: 0.7 10*3/uL (ref 0.7–4.0)
MCHC: 32.5 g/dL (ref 30.0–36.0)
MCV: 89.9 fl (ref 78.0–100.0)
Monocytes Absolute: 0.6 10*3/uL (ref 0.1–1.0)
Monocytes Relative: 6.9 % (ref 3.0–12.0)
Neutro Abs: 7 10*3/uL (ref 1.4–7.7)
Neutrophils Relative %: 83.1 % — ABNORMAL HIGH (ref 43.0–77.0)
Platelets: 193 10*3/uL (ref 150.0–400.0)
RBC: 3.76 Mil/uL — ABNORMAL LOW (ref 3.87–5.11)
RDW: 13.1 % (ref 11.5–14.6)
WBC: 8.4 10*3/uL (ref 4.5–10.5)

## 2012-04-23 LAB — IBC PANEL
Iron: 29 ug/dL — ABNORMAL LOW (ref 42–145)
Saturation Ratios: 10.3 % — ABNORMAL LOW (ref 20.0–50.0)
Transferrin: 201.1 mg/dL — ABNORMAL LOW (ref 212.0–360.0)

## 2012-04-23 MED ORDER — PROMETHAZINE HCL 25 MG PO TABS: 25.0000 mg | ORAL_TABLET | Freq: Three times a day (TID) | ORAL | Status: DC | PRN

## 2012-04-23 MED ORDER — RIFAXIMIN 550 MG PO TABS: 550.0000 mg | ORAL_TABLET | Freq: Two times a day (BID) | ORAL | Status: DC

## 2012-04-23 MED ORDER — LOPERAMIDE HCL 1 MG/5ML PO LIQD: 0.5000 mg | Freq: Every day | ORAL | Status: DC

## 2012-04-23 NOTE — Progress Notes (Signed)
GARY BULTMAN 15-Sep-1945 MRN [id number]  History of Present Illness:  This is a 67 year old white female with chronic small bowel obstruction due to adhesive disease from multiple abdominal surgeries. Her last exploratory laparotomy was in March 2011. Her last appointment with [place] was in November 2013. She is doing reasonably well. The weight has increased from 97 to 101 pounds. Her complaints today are fecal incontinence and diarrhea. She wakes up at night with stool incontinence. There is a history of a subtotal colectomy and distal ileal stricture seen on colonoscopy in October 2012. Her colonic anastomosis is at 20 cm. She has chronic iron deficiency anemia and has received iron infusions in the past. Her last hemoglobin was 10.8, hematocrit 33.2 and in 11% iron saturation in November 2013. Her last CT scan of the abdomen in September 2013 showed a dilated right ureter, emphysema and chronic mild intrahepatic biliary duct dilatation.   Past Medical History  Diagnosis Date  . GERD (gastroesophageal reflux disease)   . CAD (coronary artery disease)     mild disease per cath in 2011  . Venous embolism and thrombosis of subclavian vein     after pacemaker insertion Oct 2011  . Pleural effusion      s/p right thoracentesis 03/09  . Small bowel obstruction   . Urinary incontinence      -INTERSTIM IMPLANT NOT FUNCTIONING PER PT  . RLS (restless legs syndrome)   . Primary dilated cardiomyopathy     EF 45 to 50% per echo in Jan 2012  . Vitamin B12 deficiency   . Gastroparesis   . Chronic nausea   . Chronic abdominal pain   . Chronic pain syndrome   . Pacemaker     CRT therapy; followed by Dr. Graciela Husbands  . H/O: GI bleed     from Pradaxa  . Oxygen dependent     2 liters via nasal cannula at all times  . CHF (congestive heart failure)   . Anemia   . Hx of stress fx aug 2011    right hip   . Elbow fracture, left aug 2012  . Arthritis     right hip  . Hx of gastric ulcer   .  Diverticulitis   . HTN (hypertension)     under control; has been on med. x "years"  . Hx MRSA infection   . Pneumonia - 03/09, 12/09, 02/10, 06/10    MOST RECENT FEB 2013  . Elevated liver enzymes   . Blood transfusion   . Anginal pain   . Shortness of breath   . Fibromyalgia     COUPLE OF TIMES A YEAR-PT HAS EPISODES OF CONFUSION-USUALLY INVOLVES DAY/NIGHT REVERSAL AND EPISODES OF TWITCHING AND FALLS--SEES DR. Modesto Charon - NEUROLOGIST-LAST SEEN 07/10/11  . LBBB (left bundle branch block)   . Atrial fibrillation   . COPD (chronic obstructive pulmonary disease)     continuous O2- 2l  . Hepatitis     hx of in high school    Past Surgical History  Procedure Date  . Appendectomy   . Pacemaker placement 12/21/2009  . Peg removed     with complications  . Tonsillectomy age 58  . Interstim implant revision 03/06/2011    Procedure: REVISION OF Leane Platt;  Surgeon: Martina Sinner, MD;  Location: [facility];  Service: Urology;  Laterality: N/A;  Replacement of Neurostimulator  . Portacath placement 12/16/2009  . Total abdominal hysterectomy     complete  . Interstim implant placement 05/28/2006 -  stage I    06/05/2006 - stage II  . Interstim implant revision 10/23/2007  . Cystoscopy with injection 04/30/2006    transurethral collagen injection; incision vaginal stenosis  . Small intestine surgery 05/20/2001    ex. lap., resection of small bowel stricture; gastrostomy; insertion central line  . Cardiac catheterization 04/08/2006, 11/16/2009  . Cystoscopy 11/11/2008  . Cystoscopy w/ retrogrades 11/11/2008    right  . Colectomy     Intestinal resection (5times)  . Colectomy 02/04/2000    ex. lap., intra-abd. subtotal colectomy with ileosigmoid colon anastomosies and lysis of adhesions  . Gastrocutaneous fistula closure   . Exploratory laparotomy 04/27/2009    lysis of adhesions, gastrostomy tube  . Port-a-cath removal 07/27/2011    Procedure: REMOVAL PORT-A-CATH;  Surgeon: Emelia Loron, MD;   Location: [facility];  Service: General;  Laterality: N/A;  . Total hip arthroplasty 08/03/2011    Procedure: TOTAL HIP ARTHROPLASTY ANTERIOR APPROACH;  Surgeon: Kathryne Hitch, MD;  Location: [facility];  Service: Orthopedics;  Laterality: Right;  . Interstim implant placement 02/05/2012    Procedure: INTERSTIM IMPLANT FIRST STAGE;  Surgeon: Martina Sinner, MD;  Location: [facility];  Service: Urology;  Laterality: N/A;  Replacement of Interstim Lead       reports that she quit smoking about 26 years ago. She has never used smokeless tobacco. She reports that she does not drink alcohol or use illicit drugs. family history includes Breast cancer in her maternal aunt; Diabetes in her paternal grandfather; Heart disease in her brother, father, mother, and paternal aunt; and Stroke in her mother.  There is no history of Colon cancer. Allergies  Allergen Reactions  . Dabigatran Etexilate Mesylate Other (See Comments)    INTERNAL BLEEDING-pradaxa  . Talwin (Pentazocine) Other (See Comments)    hallucinations  . Pentazocine Lactate Other (See Comments)    HALLUCINATIONS  . Sulfonamide Derivatives Other (See Comments)    JITTERINESS         Review of Systems: Denies heartburn dysphagia. Positive for occasional vomiting  The remainder of the 10 point ROS is negative except as outlined in H&P   Physical Exam: General appearance  Well developed, in no distress. With nasal oxygen. Appears chronically ill Eyes- non icteric. HEENT nontraumatic, normocephalic. Mouth no lesions, tongue papillated, no cheilosis. Neck supple without adenopathy, thyroid not enlarged, no carotid bruits, no JVD. Lungs Clear to auscultation bilaterally. Cor normal S1, normal S2, regular rhythm, no murmur,  quiet precordium. Abdomen: Mildly distended with abnormal bowel sounds and rushes. No tenderness. Increased tympany in upper abdomen. Rectal: Yellow pasty Hemoccult-positive stool. Extremities  no pedal edema. Skin no lesions. Neurological alert and oriented x 3. Psychological normal mood and affect.  Assessment and Plan:  Problem #1 Chronic diarrhea due to subtotal colectomy and partial small bowel obstruction causing bacterial overgrowth. We will start Rifaximin 550 mg twice a day for 10 days. We will also give a trial of Imodium 0.5 mg at bedtime to prevent nocturnal episodes of incontinence. She may be able to increase it to 1 mg at bedtime.  Problem #2 Failure to thrive. She has done reasonably well gaining 4 pounds since last visit.  Problem #3 Hemoccult-positive stool with iron deficiency anemia. We will check a CBC and iron studies today and decide whether iron infusion is indicated. She also eds refills on her Phenergan.     04/23/2012 [name]

## 2012-04-23 NOTE — Patient Instructions (Addendum)
Your physician has requested that you go to the basement for the following lab work before leaving today: CBC, CMET, IBC  We have sent the following medications to your pharmacy for you to pick up at your convenience: Xifaxan Imodium 2.5 ml (1/2 teaspoon) at bedtime. Phenergan  CC: Dr Loreen Freud

## 2012-04-29 ENCOUNTER — Telehealth: Payer: Self-pay | Admitting: *Deleted

## 2012-04-29 DIAGNOSIS — R52 Pain, unspecified: Secondary | ICD-10-CM

## 2012-04-29 MED ORDER — OXYCODONE-ACETAMINOPHEN 10-325 MG PO TABS: 1.0000 | ORAL_TABLET | ORAL | Status: DC | PRN

## 2012-04-29 MED ORDER — FENTANYL 50 MCG/HR TD PT72: 1.0000 | MEDICATED_PATCH | TRANSDERMAL | Status: DC

## 2012-04-29 NOTE — Telephone Encounter (Signed)
msg left to make the patient aware Rx ready for pick up.      KP

## 2012-04-29 NOTE — Telephone Encounter (Signed)
Ok to refill x1 each   

## 2012-04-29 NOTE — Telephone Encounter (Signed)
Patient called triage would like refills for oxycodone and fentanyl patches. Both last filled 03/26/12, last ov here 01/2012. OK to refill?

## 2012-04-30 ENCOUNTER — Other Ambulatory Visit (HOSPITAL_COMMUNITY): Payer: Self-pay | Admitting: Internal Medicine

## 2012-05-01 ENCOUNTER — Encounter (HOSPITAL_COMMUNITY): Admission: RE | Admit: 2012-05-01 | Payer: Medicare Other | Source: Ambulatory Visit

## 2012-05-05 ENCOUNTER — Ambulatory Visit (INDEPENDENT_AMBULATORY_CARE_PROVIDER_SITE_OTHER): Payer: Medicare Other | Admitting: Internal Medicine

## 2012-05-05 ENCOUNTER — Other Ambulatory Visit: Payer: Self-pay | Admitting: Family Medicine

## 2012-05-05 ENCOUNTER — Telehealth: Payer: Self-pay | Admitting: Family Medicine

## 2012-05-05 DIAGNOSIS — E538 Deficiency of other specified B group vitamins: Secondary | ICD-10-CM

## 2012-05-05 MED ORDER — CYANOCOBALAMIN 1000 MCG/ML IJ SOLN
1000.0000 ug | Freq: Once | INTRAMUSCULAR | Status: AC
  Administered 2012-05-05: 1000 ug via INTRAMUSCULAR

## 2012-05-05 NOTE — Telephone Encounter (Signed)
Last seen 01/29/12 and filled 04/03/12 #60. Please advise     KP

## 2012-05-05 NOTE — Telephone Encounter (Signed)
Rx manually faxed      KP

## 2012-05-05 NOTE — Progress Notes (Signed)
Patient ID: [name], female   DOB: [birth year], 67 y.o.   MRN: [id number] Patient informed of national B-12 back order, therefore she will call next month to see if we have it before making an appointment.  Suggested she also check with her PCP.  She verbalized understanding.

## 2012-05-05 NOTE — Telephone Encounter (Signed)
refill  Temazepam (Cap) 15 MG TAKE 1 TO 2 CAPSULES BY MOUTH EVERY NIGHT AT BEDTIME AS NEEDED FOR SLEEP

## 2012-05-07 ENCOUNTER — Encounter (HOSPITAL_COMMUNITY): Payer: Self-pay

## 2012-05-07 ENCOUNTER — Encounter (HOSPITAL_COMMUNITY)
Admission: RE | Admit: 2012-05-07 | Discharge: 2012-05-07 | Disposition: A | Discharge status: Home / Self Care | Payer: Medicare Other | Source: Ambulatory Visit | Attending: Internal Medicine | Admitting: Internal Medicine

## 2012-05-07 VITALS — BP 158/88 | HR 81 | Temp 97.4°F | Resp 16

## 2012-05-07 DIAGNOSIS — D509 Iron deficiency anemia, unspecified: Secondary | ICD-10-CM

## 2012-05-07 MED ORDER — SODIUM CHLORIDE 0.9 % IV SOLN
Freq: Once | INTRAVENOUS | Status: AC
Start: 2012-05-07 — End: 2012-05-07
  Administered 2012-05-07: 14:00:00 via INTRAVENOUS

## 2012-05-07 MED ORDER — SODIUM CHLORIDE 0.9 % IV SOLN
1020.0000 mg | Freq: Once | INTRAVENOUS | Status: AC
  Administered 2012-05-07: 1020 mg via INTRAVENOUS
  Filled 2012-05-07: qty 34

## 2012-05-07 NOTE — Progress Notes (Addendum)
Tolerated Feraheme well. No signs of adverse reaction noted. Verbalizes understanding of discharge instructions. Instructed to call Dr. Juanda Chance for any concerns following discharge from Short Stay.

## 2012-05-09 ENCOUNTER — Ambulatory Visit (INDEPENDENT_AMBULATORY_CARE_PROVIDER_SITE_OTHER): Payer: Medicare Other | Admitting: Pulmonary Disease

## 2012-05-09 ENCOUNTER — Encounter: Payer: Self-pay | Admitting: Pulmonary Disease

## 2012-05-09 VITALS — BP 112/74 | HR 86 | Temp 98.2°F | Wt 103.6 lb

## 2012-05-09 DIAGNOSIS — J9610 Chronic respiratory failure, unspecified whether with hypoxia or hypercapnia: Secondary | ICD-10-CM

## 2012-05-09 DIAGNOSIS — J9611 Chronic respiratory failure with hypoxia: Secondary | ICD-10-CM

## 2012-05-09 DIAGNOSIS — J189 Pneumonia, unspecified organism: Secondary | ICD-10-CM

## 2012-05-09 DIAGNOSIS — J449 Chronic obstructive pulmonary disease, unspecified: Secondary | ICD-10-CM

## 2012-05-09 IMAGING — US US ABDOMEN COMPLETE
1 series · 14 of 25 positions shown · non-contrast
Comparison: CT abdomen and pelvis 08/08/2010 and02/22/2009.
Abdominal ultrasound 09/07/2008.

CLINICAL DATA: Hydronephrosis.

COMPLETE ABDOMINAL ULTRASOUND

[Series 1: us abdomen complete · 0.26mm/px · 14 of 79 slices shown]
[im 1/79]
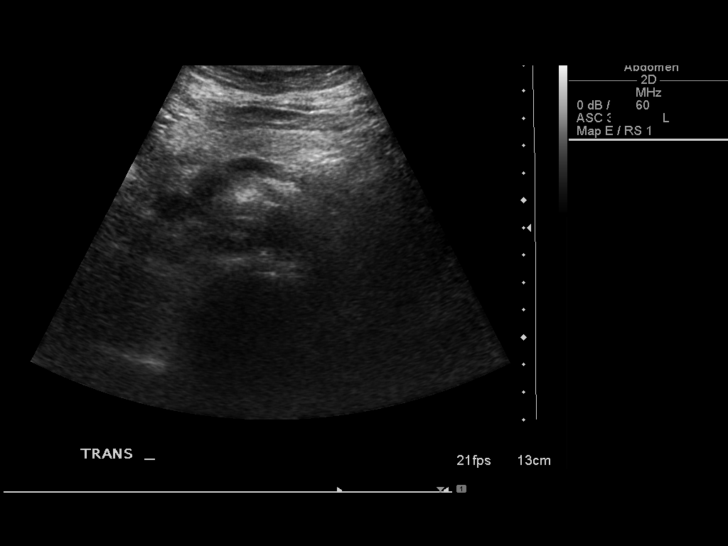
[im 7/79]
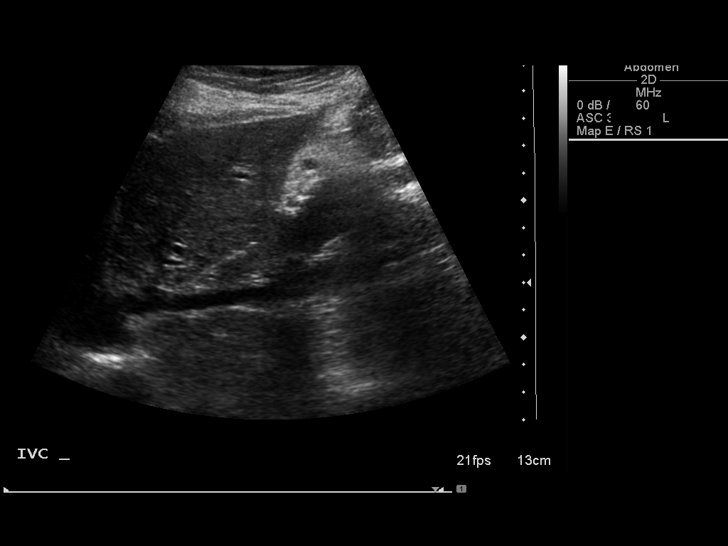
[im 14/79]
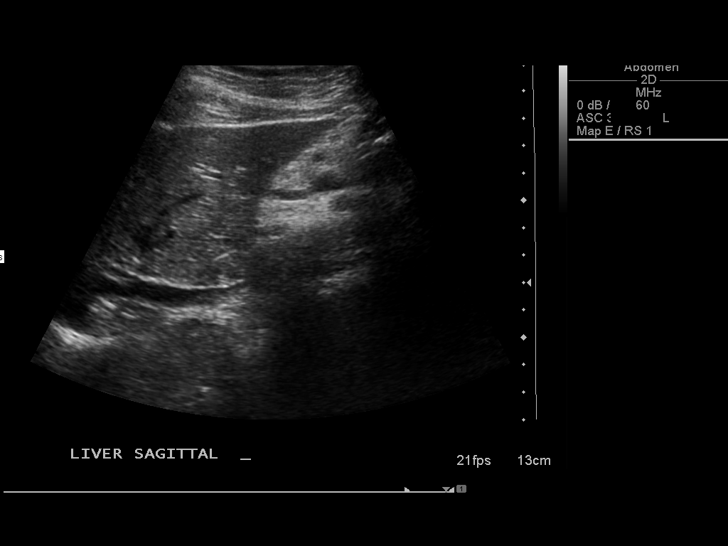
[im 20/79]
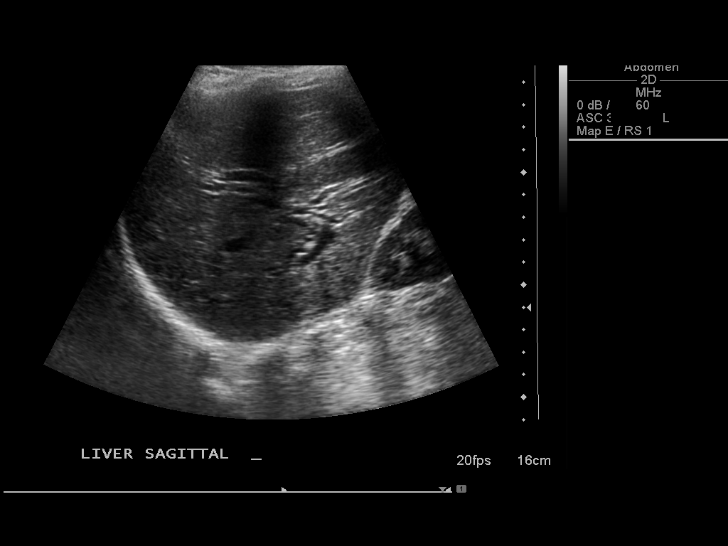
[im 27/79]
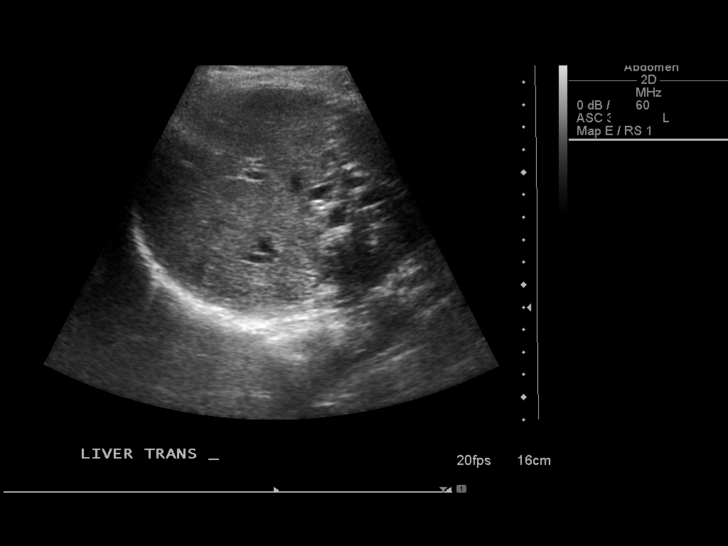
[im 30/79]
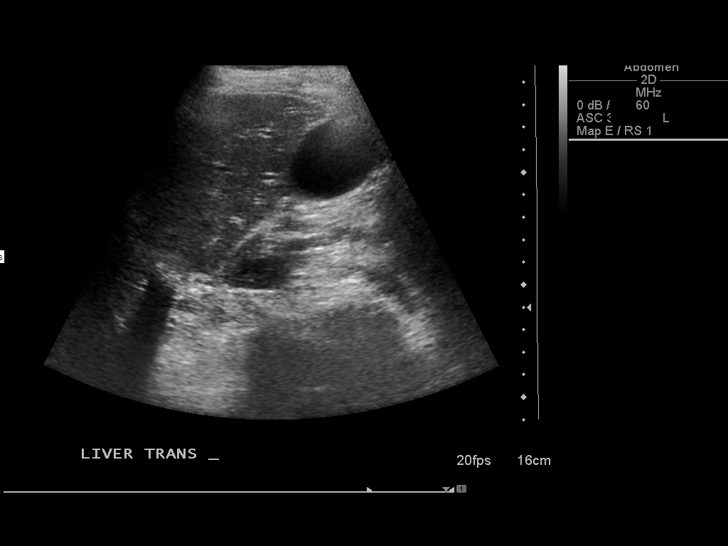
[im 36/79]
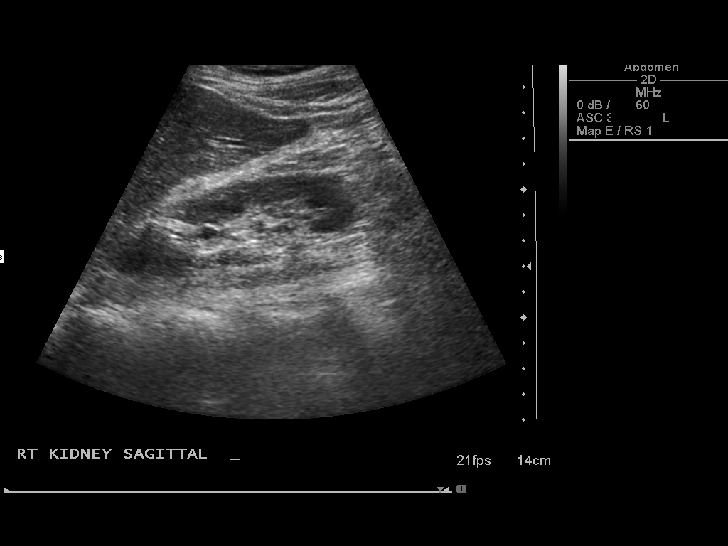
[im 43/79]
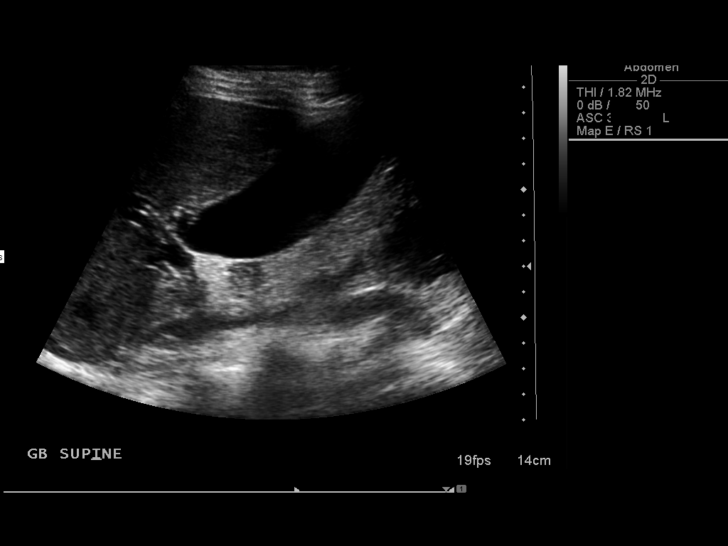
[im 49/79]
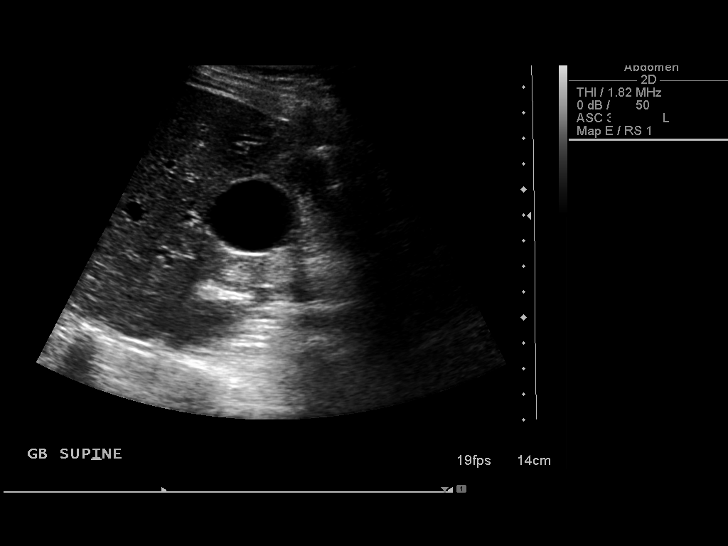
[im 53/79]
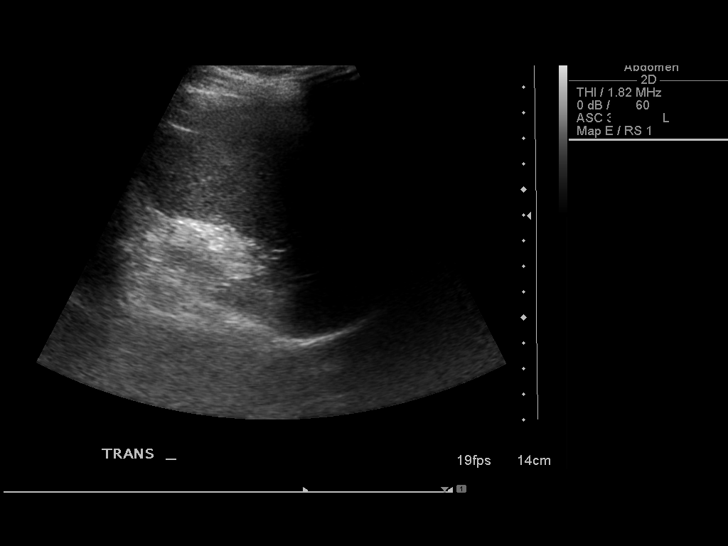
[im 59/79]
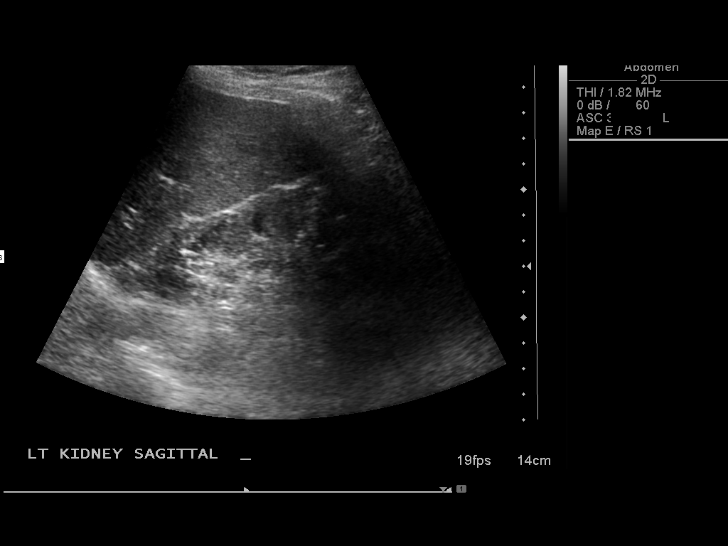
[im 66/79]
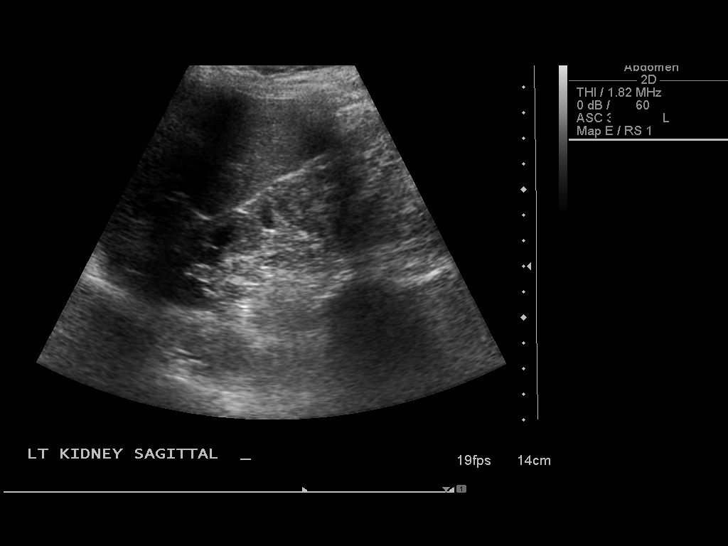
[im 72/79]
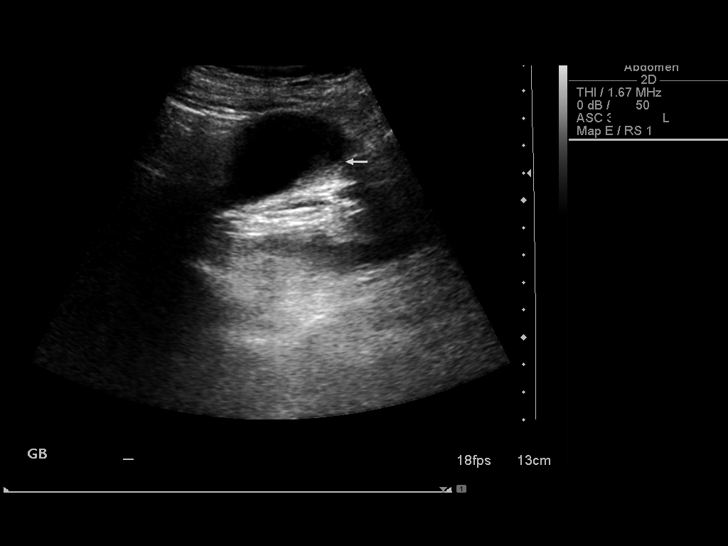
[im 79/79]
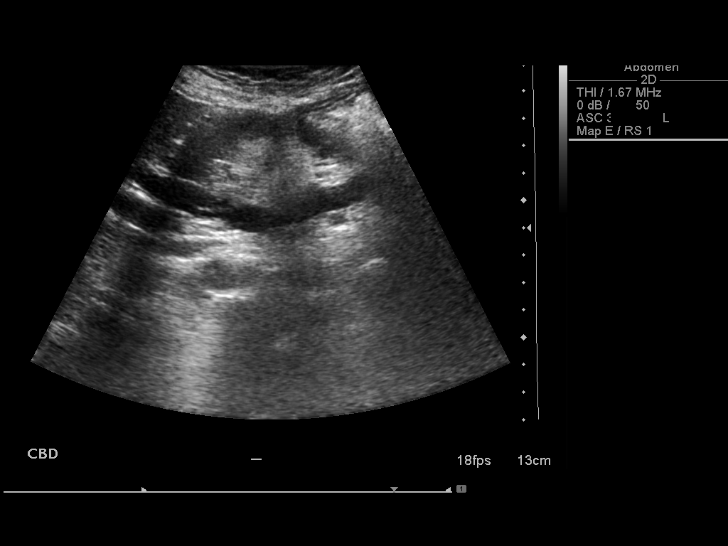

[14 of 25 positions shown; findings below may reference images not displayed]

FINDINGS: Gallbladder:  Tiny amount of sludge is seen in the gallbladder.  No
stones, wall thickening or pericholecystic fluid.  Sonographer
reports negative Murphy's sign.

Common bile duct:  Measures 1.1 cm, unchanged.

Liver:  No focal lesion is identified.  There is some mild
intrahepatic biliary ductal dilatation.

IVC:  Appears normal.

Pancreas:  No focal lesion is seen.  The pancreatic duct is
prominent at 0.3 cm, unchanged.

Spleen:  Measures 9.0 cm and appears normal.

Right Kidney:  Measures 10.5 cm. Mild fullness of the right renal
collecting system without frank hydronephrosis.  No stone or mass.

Left Kidney:  8.0 cm.  No stone, mass or hydronephrosis.

Abdominal aorta:  No aneurysm identified.
IMPRESSION: 1.  Mild fullness the right renal collecting system without frank
hydronephrosis.
2.  Chronic intra and extrahepatic biliary ductal dilatation.
3.  Tiny amountof gallbladder sludge without evidence of
cholecystitis.

## 2012-05-09 NOTE — Patient Instructions (Signed)
Follow up in 6 months 

## 2012-05-09 NOTE — Assessment & Plan Note (Signed)
Follow up CXR as needed if her symptoms get worse.

## 2012-05-09 NOTE — Assessment & Plan Note (Signed)
She is to continue spiriva and prn proair.

## 2012-05-09 NOTE — Progress Notes (Signed)
Chief Complaint  Patient presents with  . Follow-up    breathing is okay. [name] uses her oxygen 24/7. [name] states at times [name] finds herself breathing through her mouth. Slight cough. no wheezing, no chest tx.     History of Present Illness: [name] is a 67 y.o. female with history of COPD/emphysema, chronic hypoxemic respiratory failure, and recurrent pneumonia with focal areas of bronchiectasis.  [name] was in hospital for SBO in September 2013.  Her breathing is doing okay.  [name] occasionally feels like [name] has to sigh to get a breath.  [name] is not having sputum, fever, hemoptysis, or chest pain.  [name] gets occasional cough.  [name] is not having wheeze.  [name] does not use albuterol much.  [name] uses her oxygen 24/7.   TESTS: 06/28/08 PFT >> FEV1 1.95 (97%), FEV1% 70, TLC 3.88 (87%), DLCO 64% ONO 02/11/10 >> Test time 9 hrs 9 min, Mean SpO2 87%, low SpO2 80%, Spent 5hrs 57 min (65%) with SpO2 < 88%. 10/10/11 PFT >> FEV1 1.15 (59%), FEV1% 64, TLC 3.26 (73%), DLCO 35%, + BD CT chest 04/30/2011 >> Chronic multifocal nodular and patchy airspace densities throughout the right lung with mild BTX.   Past Medical History  Diagnosis Date  . GERD (gastroesophageal reflux disease)   . CAD (coronary artery disease)     mild disease per cath in 2011  . Venous embolism and thrombosis of subclavian vein     after pacemaker insertion Oct 2011  . Pleural effusion      s/p right thoracentesis 03/09  . Small bowel obstruction   . Urinary incontinence      -INTERSTIM IMPLANT NOT FUNCTIONING PER PT  . RLS (restless legs syndrome)   . Primary dilated cardiomyopathy     EF 45 to 50% per echo in Jan 2012  . Vitamin B12 deficiency   . Gastroparesis   . Chronic nausea   . Chronic abdominal pain   . Chronic pain syndrome   . Pacemaker     CRT therapy; followed by Dr. Graciela Husbands  . H/O: GI bleed     from Pradaxa  . Oxygen dependent     2 liters via nasal cannula at all times  . CHF (congestive heart  failure)   . Anemia   . Hx of stress fx aug 2011    right hip   . Elbow fracture, left aug 2012  . Arthritis     right hip  . Hx of gastric ulcer   . Diverticulitis   . HTN (hypertension)     under control; has been on med. x "years"  . Hx MRSA infection   . Pneumonia - 03/09, 12/09, 02/10, 06/10    MOST RECENT FEB 2013  . Elevated liver enzymes   . Blood transfusion   . Anginal pain   . Shortness of breath   . Fibromyalgia     COUPLE OF TIMES A YEAR-PT HAS EPISODES OF CONFUSION-USUALLY INVOLVES DAY/NIGHT REVERSAL AND EPISODES OF TWITCHING AND FALLS--SEES DR. Modesto Charon - NEUROLOGIST-LAST SEEN 07/10/11  . LBBB (left bundle branch block)   . Atrial fibrillation   . COPD (chronic obstructive pulmonary disease)     continuous O2- 2l  . Hepatitis     hx of in high school     Past Surgical History  Procedure Laterality Date  . Appendectomy    . Pacemaker placement  12/21/2009  . Peg removed      with complications  . Tonsillectomy  age 48  . Interstim implant revision  03/06/2011    Procedure: REVISION OF Leane Platt;  Surgeon: Martina Sinner, MD;  Location: [facility];  Service: Urology;  Laterality: N/A;  Replacement of Neurostimulator  . Portacath placement  12/16/2009  . Total abdominal hysterectomy      complete  . Interstim implant placement  05/28/2006 - stage I    06/05/2006 - stage II  . Interstim implant revision  10/23/2007  . Cystoscopy with injection  04/30/2006    transurethral collagen injection; incision vaginal stenosis  . Small intestine surgery  05/20/2001    ex. lap., resection of small bowel stricture; gastrostomy; insertion central line  . Cardiac catheterization  04/08/2006, 11/16/2009  . Cystoscopy  11/11/2008  . Cystoscopy w/ retrogrades  11/11/2008    right  . Colectomy      Intestinal resection (5times)  . Colectomy  02/04/2000    ex. lap., intra-abd. subtotal colectomy with ileosigmoid colon anastomosies and lysis of adhesions  . Gastrocutaneous fistula closure     . Exploratory laparotomy  04/27/2009    lysis of adhesions, gastrostomy tube  . Port-a-cath removal  07/27/2011    Procedure: REMOVAL PORT-A-CATH;  Surgeon: Emelia Loron, MD;  Location: [facility];  Service: General;  Laterality: N/A;  . Total hip arthroplasty  08/03/2011    Procedure: TOTAL HIP ARTHROPLASTY ANTERIOR APPROACH;  Surgeon: Kathryne Hitch, MD;  Location: [facility];  Service: Orthopedics;  Laterality: Right;  . Interstim implant placement  02/05/2012    Procedure: INTERSTIM IMPLANT FIRST STAGE;  Surgeon: Martina Sinner, MD;  Location: [facility];  Service: Urology;  Laterality: N/A;  Replacement of Interstim Lead       Outpatient Encounter Prescriptions as of 05/09/2012  Medication Sig Dispense Refill  . albuterol (PROAIR HFA) 108 (90 BASE) MCG/ACT inhaler Inhale 2 puffs into the lungs every 6 (six) hours as needed for wheezing.  1 Inhaler  5  . azelastine (OPTIVAR) 0.05 % ophthalmic solution Place 1 drop into both eyes 2 (two) times daily.  6 mL  12  . calcium-vitamin D (OSCAL WITH D) 500-200 MG-UNIT per tablet Take 1 tablet by mouth 2 (two) times daily.  60 tablet  0  . carvedilol (COREG) 6.25 MG tablet TAKE ONE TABLET BY MOUTH TWICE DAILY WITH MEALS  60 tablet  2  . cyclobenzaprine (FLEXERIL) 10 MG tablet TAKE 1 TABLET BY MOUTH THREE TIMES DAILY AS NEEDED FOR MUSCLE SPASMS  60 tablet  0  . denosumab (PROLIA) 60 MG/ML SOLN Inject 60 mg into the skin every 6 (six) months. Twice a year      . fentaNYL (DURAGESIC - DOSED MCG/HR) 50 MCG/HR Place 1 patch (50 mcg total) onto the skin every 3 (three) days.  10 patch  0  . furosemide (LASIX) 20 MG tablet TAKE 1 TABLET BY MOUTH EVERY MORNING  30 tablet  5  . gabapentin (NEURONTIN) 600 MG tablet TAKE 1 TABLET BY MOUTH FOUR TIMES DAILY  120 tablet  2  . loperamide (IMODIUM) 1 MG/5ML solution Take 2.5 mLs (0.5 mg total) by mouth at bedtime.  80 mL  1  . nitroGLYCERIN (NITROSTAT) 0.4 MG SL tablet Place 0.4 mg under  the tongue every 5 (five) minutes as needed. CHEST PAIN      . oxyCODONE-acetaminophen (PERCOCET) 10-325 MG per tablet Take 1 tablet by mouth every 4 (four) hours as needed. PAIN  120 tablet  0  . pantoprazole (PROTONIX) 40 MG tablet Take  1 tablet (40 mg total) by mouth daily.  30 tablet  4  . promethazine (PHENERGAN) 25 MG tablet Take 1 tablet (25 mg total) by mouth every 8 (eight) hours as needed for nausea.  40 tablet  0  . temazepam (RESTORIL) 15 MG capsule TAKE 1 TO 2 CAPSULES BY MOUTH EVERY NIGHT AT BEDTIME AS NEEDED FOR SLEEP  60 capsule  0  . tiotropium (SPIRIVA HANDIHALER) 18 MCG inhalation capsule Place 1 capsule (18 mcg total) into inhaler and inhale every morning.  30 capsule  5  . [DISCONTINUED] rifaximin (XIFAXAN) 550 MG TABS Take 1 tablet (550 mg total) by mouth 2 (two) times daily.  20 tablet  0  . [DISCONTINUED] Vitamin D, Ergocalciferol, (DRISDOL) 50000 UNITS CAPS Take 50,000 Units by mouth every 7 (seven) days. Last dose 01/29/12       No facility-administered encounter medications on file as of 05/09/2012.    Allergies  Allergen Reactions  . Dabigatran Etexilate Mesylate Other (See Comments)    INTERNAL BLEEDING-pradaxa  . Talwin (Pentazocine) Other (See Comments)    hallucinations  . Pentazocine Lactate Other (See Comments)    HALLUCINATIONS  . Sulfonamide Derivatives Other (See Comments)    JITTERINESS     Physical Exam:  Filed Vitals:   05/09/12 1436  BP: 112/74  Pulse: 86  Temp: 98.2 F (36.8 C)  TempSrc: Oral  Weight: 103 lb 9.6 oz (46.993 kg)  SpO2: 98%     Current Encounter SPO2  05/09/12 1436 98%  05/07/12 1451 96%  05/07/12 1320 96%  02/05/12 1454 99%  02/05/12 1430 100%  02/05/12 1415 98%  02/05/12 1400 100%  02/05/12 0928 99%     Body mass index is 17.24 kg/(m^2).   Wt Readings from Last 2 Encounters:  05/09/12 103 lb 9.6 oz (46.993 kg)  04/23/12 101 lb (45.813 kg)     General - No distress, wearing oxygen ENT - No sinus  tenderness, no oral exudate, no LAN Cardiac - s1s2 regular, no murmur Chest - Prolonged exhalation, scattered rhonchi Rt > Lt, no wheeze Back - No focal tenderness Abd - Soft, non-tender Ext - No edema Neuro - Normal strength Skin - No rashes Psych - normal mood, and behavior   Assessment/Plan:  Coralyn Helling, MD [facility] Pager:  [phone] 05/09/2012, 2:45 PM

## 2012-05-09 NOTE — Assessment & Plan Note (Signed)
Continue supplemental oxygen. 

## 2012-05-18 ENCOUNTER — Other Ambulatory Visit: Payer: Self-pay | Admitting: Family Medicine

## 2012-05-19 NOTE — Telephone Encounter (Signed)
Last seen 01/29/12 and filled 04/17/12 #60. Please advise    KP

## 2012-05-27 IMAGING — CR DG CHEST 2V
2 series · 2 of 2 positions shown · non-contrast
Comparison: Chest x-ray 05/22/2011, chest CT 05/04/2011

CLINICAL DATA: Preop right hip surgery

CHEST - 2 VIEW

[w chest pa]
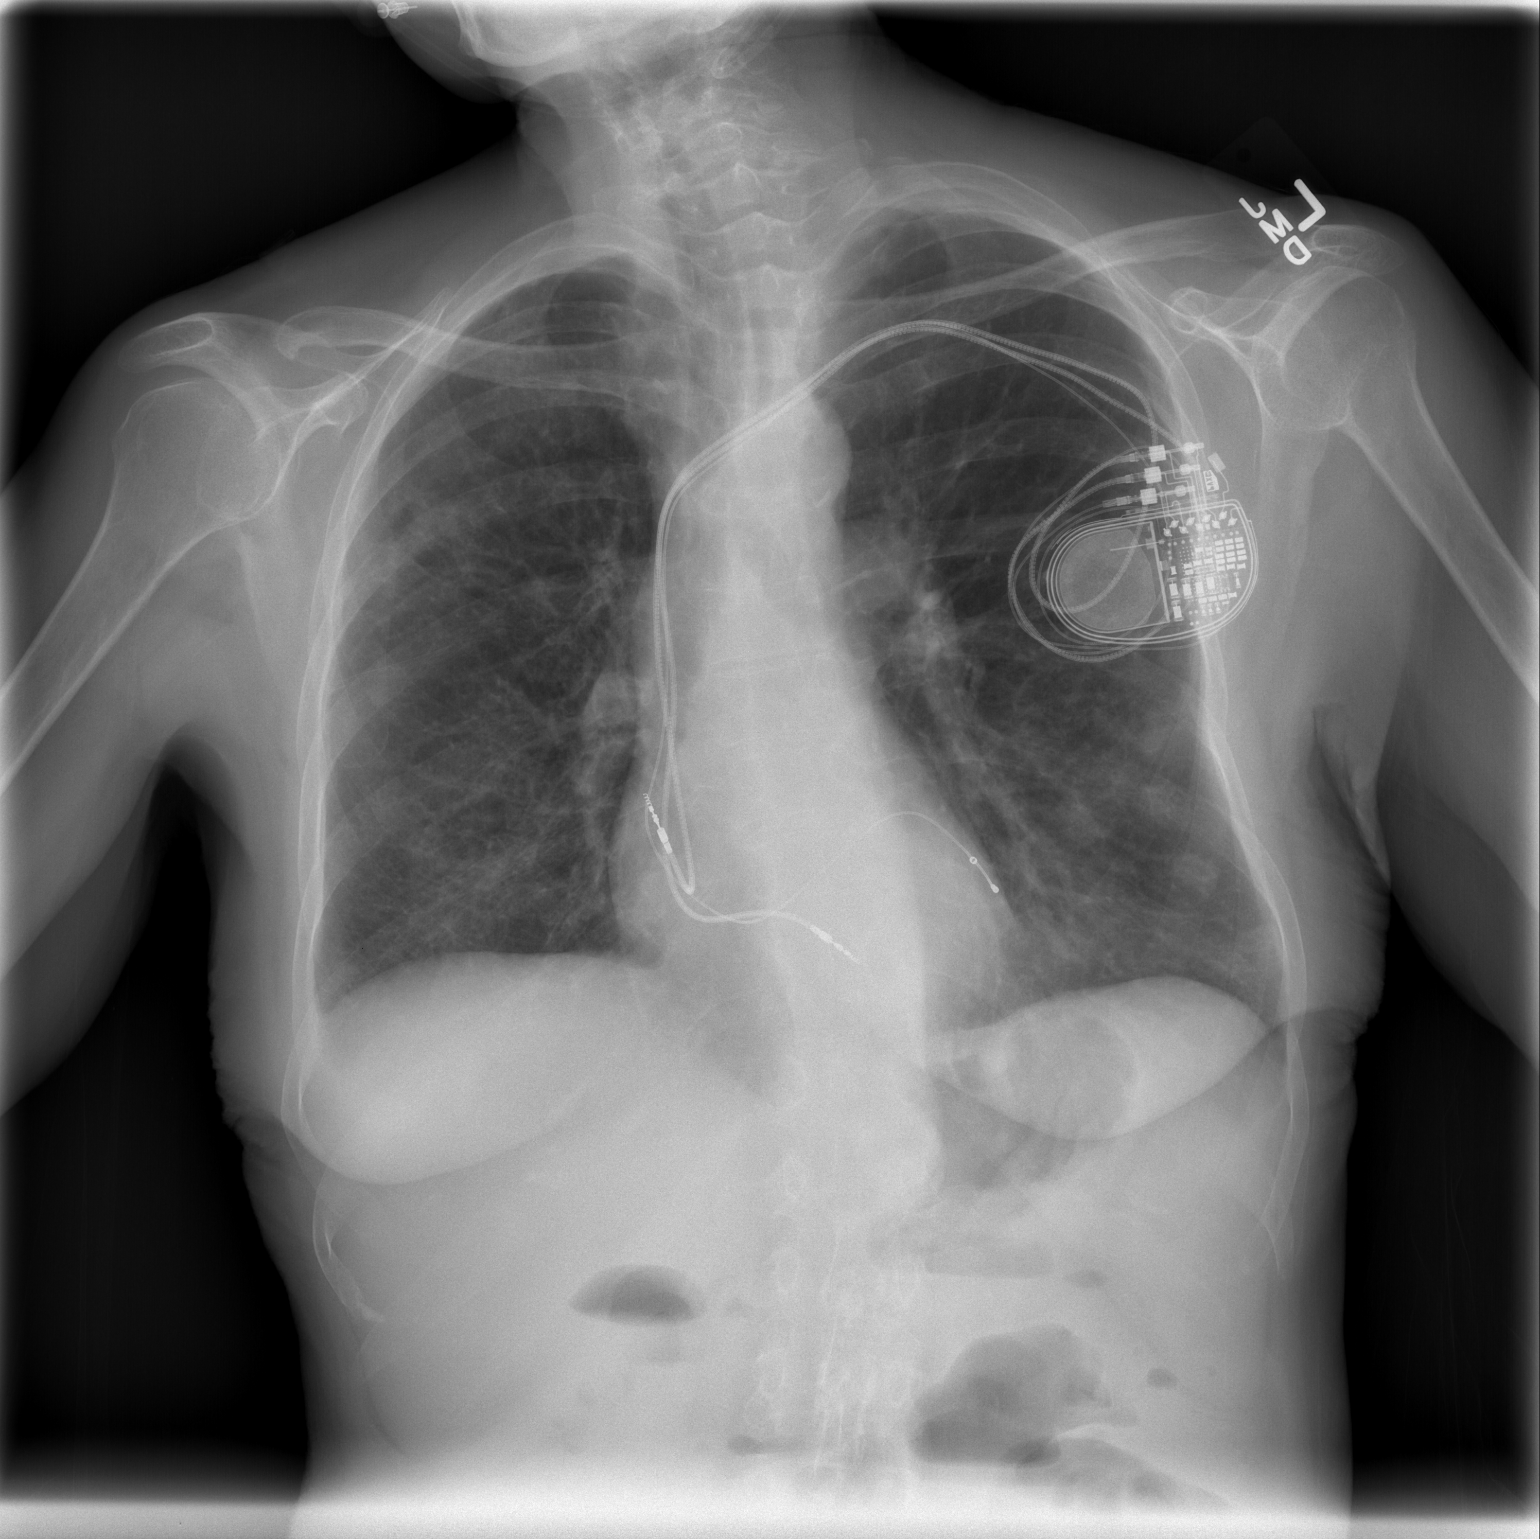

[w chest lat]
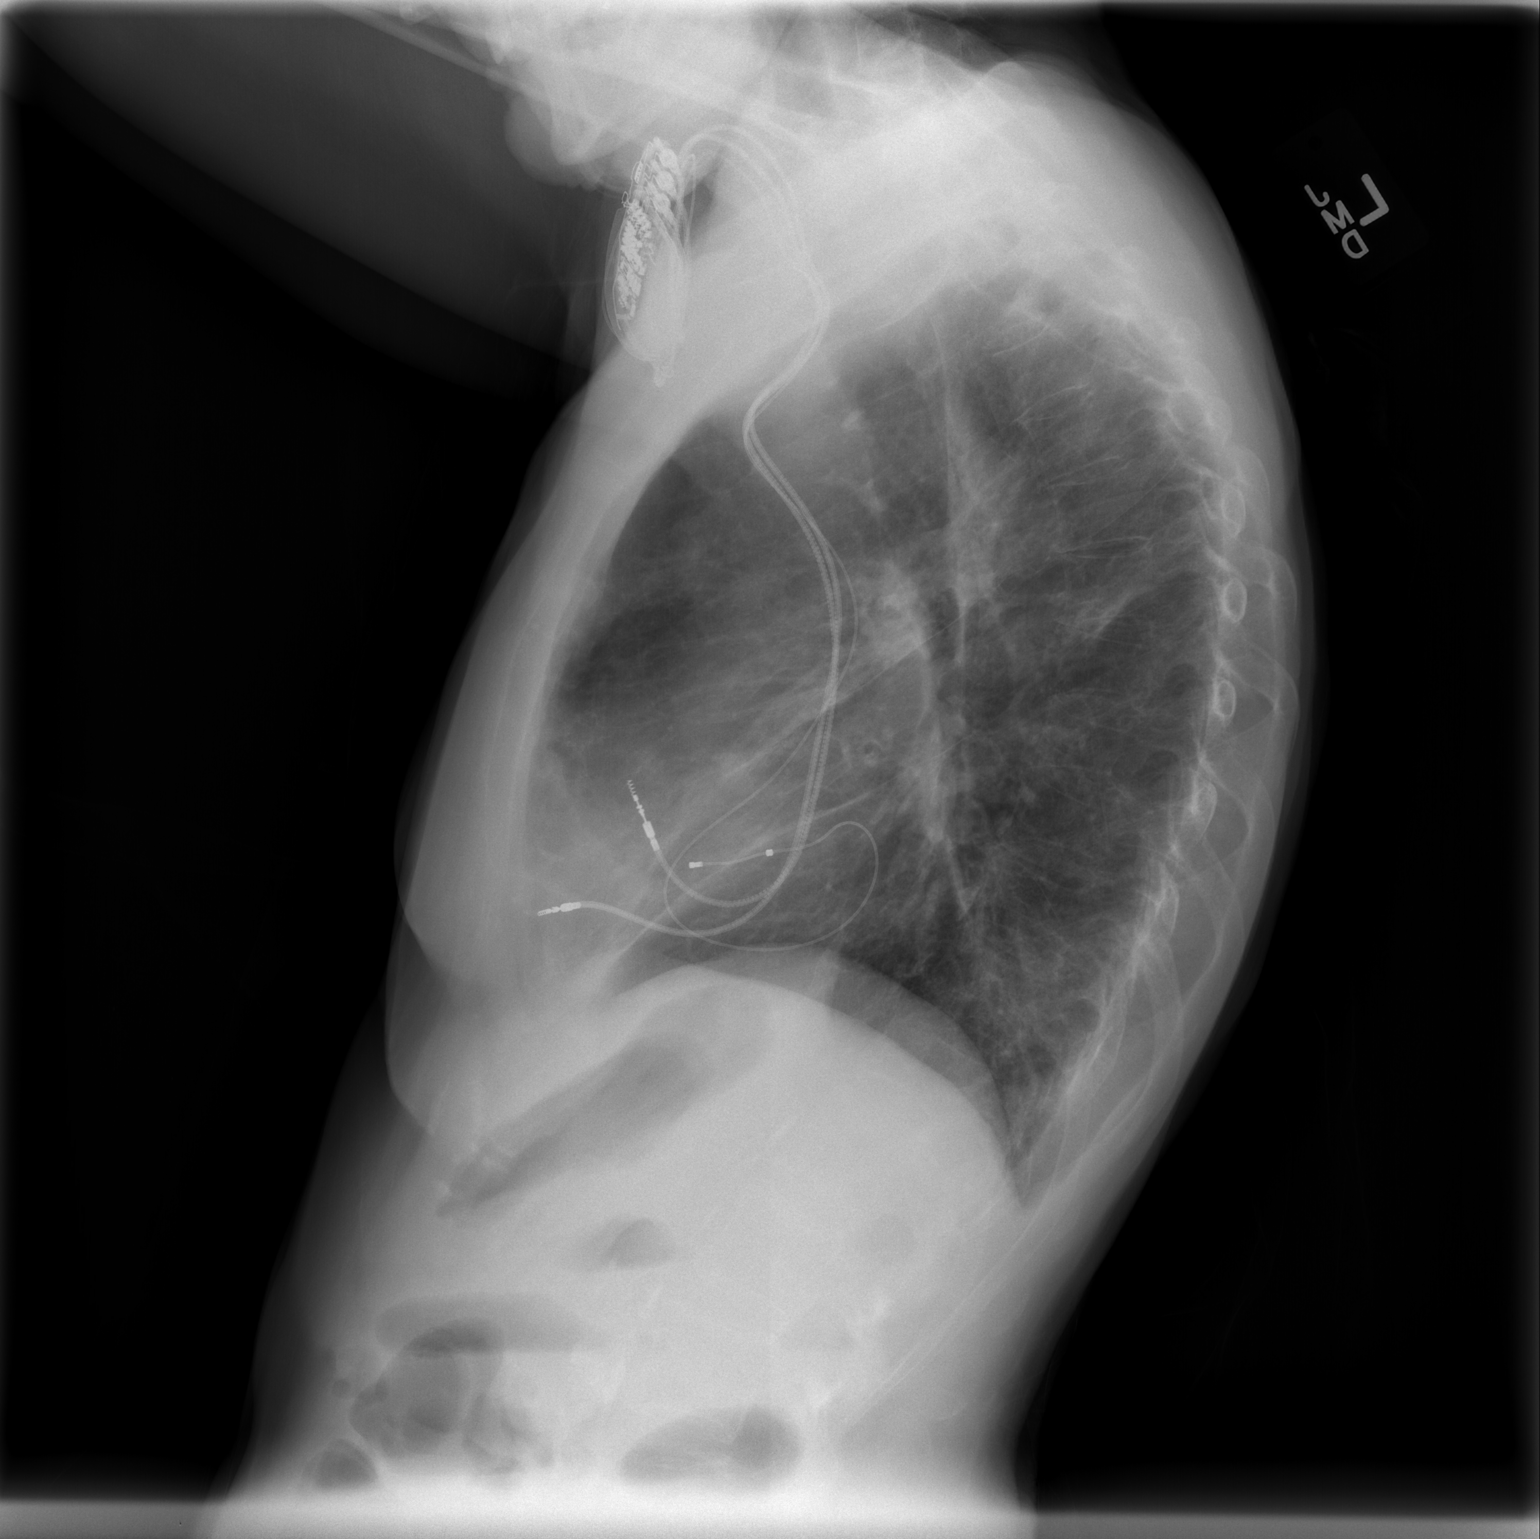

[2 of 2 positions shown; findings below may reference images not displayed]

FINDINGS: Left basilar nodular shadows are more prominent than on
the prior study. These correspond to subacute healing rib fractures
based on the prior chest CT which showed left anterior rib
fractures and callus formation.

Right lower lobe airspace disease has improved since the prior
study.  There is some residual right apical density which is most
likely scarring.

Negative for heart failure or effusion.  Port-A-Cath has been
removed since the prior study.  Three lead pacer is unchanged.
Heart size is normal.
IMPRESSION: Healing left anterior rib fractures are present, accounting for
nodular densities in the left lung base.

Improvement in right lower lobe infiltrate compared with the prior
chest CT.  Residual right upper lobe density may be infiltrate or
scarring.

## 2012-05-30 IMAGING — US IR FLUORO GUIDE CV LINE*R*
1 series · 1 of 1 positions shown · non-contrast
Comparison: none

CLINICAL DATA: preoperative access, poor peripheral veins, hip
osteoarthritis, plan for hip replacement

[Series 1: ir fluoro guide cv line*right* · 1 of 1 slices shown]
[im 1/1]
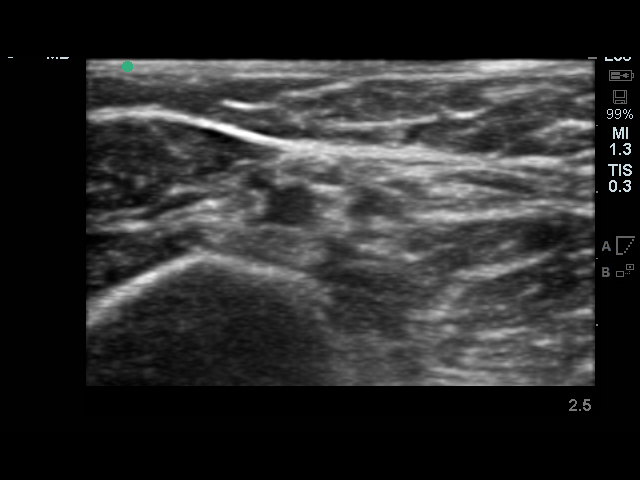

[1 of 1 positions shown; findings below may reference images not displayed]

PICC LINE PLACEMENT WITH ULTRASOUND AND FLUOROSCOPIC  GUIDANCE

Fluoroscopy Time: 1.2 minutes.

The right arm was prepped with chlorhexidine, draped in the usual
sterile fashion using maximum barrier technique (cap and mask,
sterile gown, sterile gloves, large sterile sheet, hand hygiene and
cutaneous antisepsis) and infiltrated locally with 1% Lidocaine.

Ultrasound demonstrated patency of the right brachial vein, and
this was documented with an image.  Under real-time ultrasound
guidance, this vein was accessed with a 21 gauge micropuncture
needle and image documentation was performed.  The needle was
exchanged over a guidewire for a peel-away sheath through which a 5
French double lumen PICC trimmed to 36 cm was advanced, positioned
with its tip at the lower SVC/right atrial junction.  Fluoroscopy
during the procedure and fluoro spot radiograph confirms
appropriate catheter position.  The catheter was flushed, secured
to the skin with Prolene sutures, and covered with a sterile
dressing.

Complications:  No immediate
IMPRESSION: Successful right arm PICC line placement with ultrasound and
fluoroscopic guidance.  The catheter is ready for use.

## 2012-05-31 IMAGING — CR DG HIP 1V PORT*R*
1 series · 2 of 2 positions shown · non-contrast
Comparison: Plain films right hip 03/08/2011.

CLINICAL DATA: Hip replacement.

PORTABLE RIGHT HIP - 1 VIEW

[Series 1: AP · right · 2 of 2 slices shown]
[im 1/2]
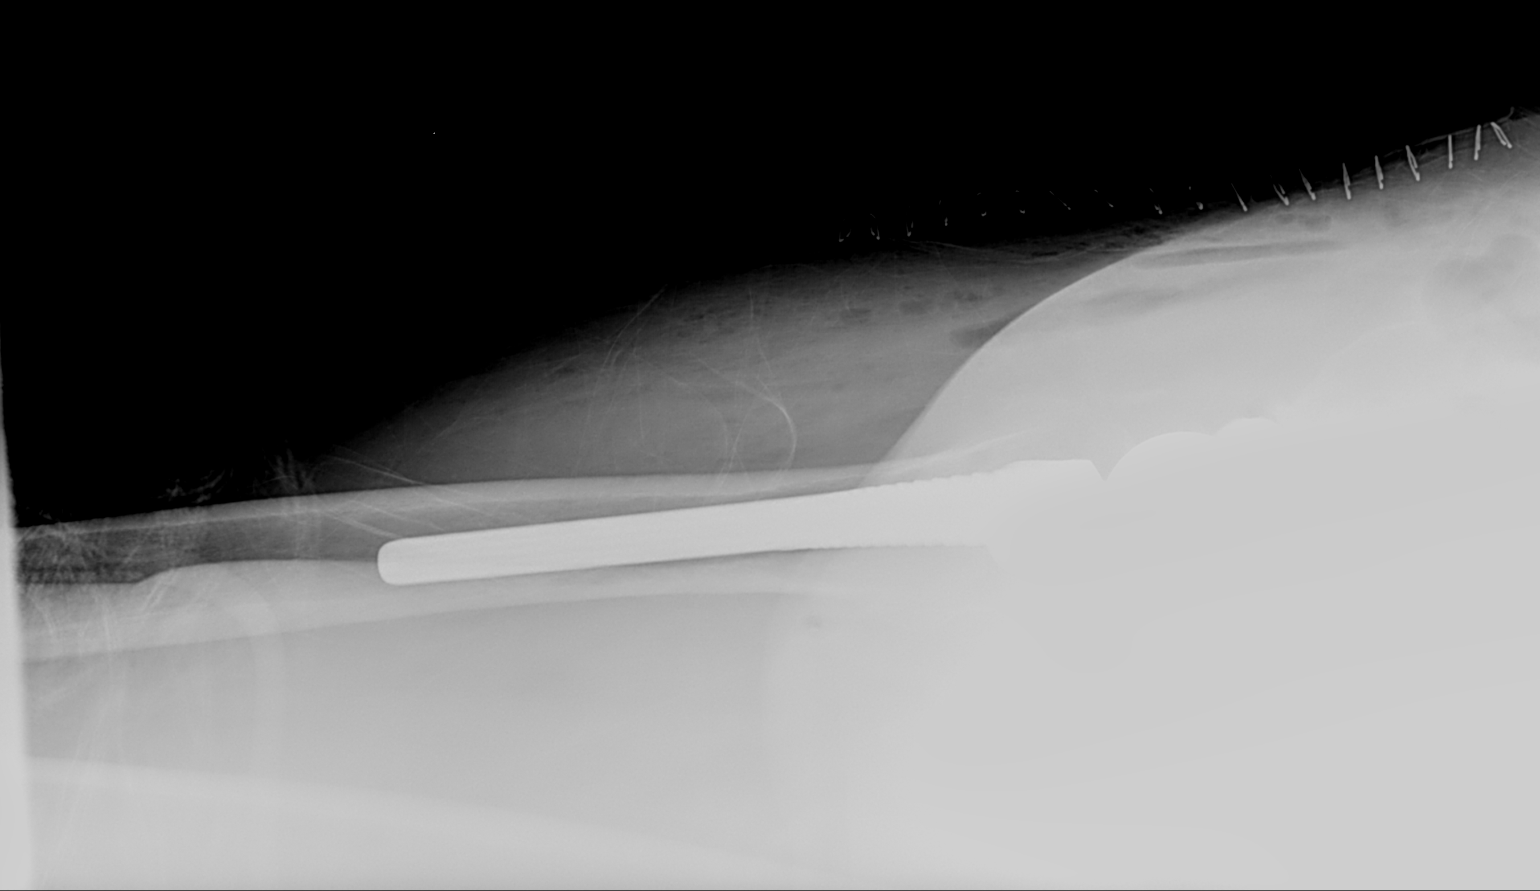
[im 2/2]
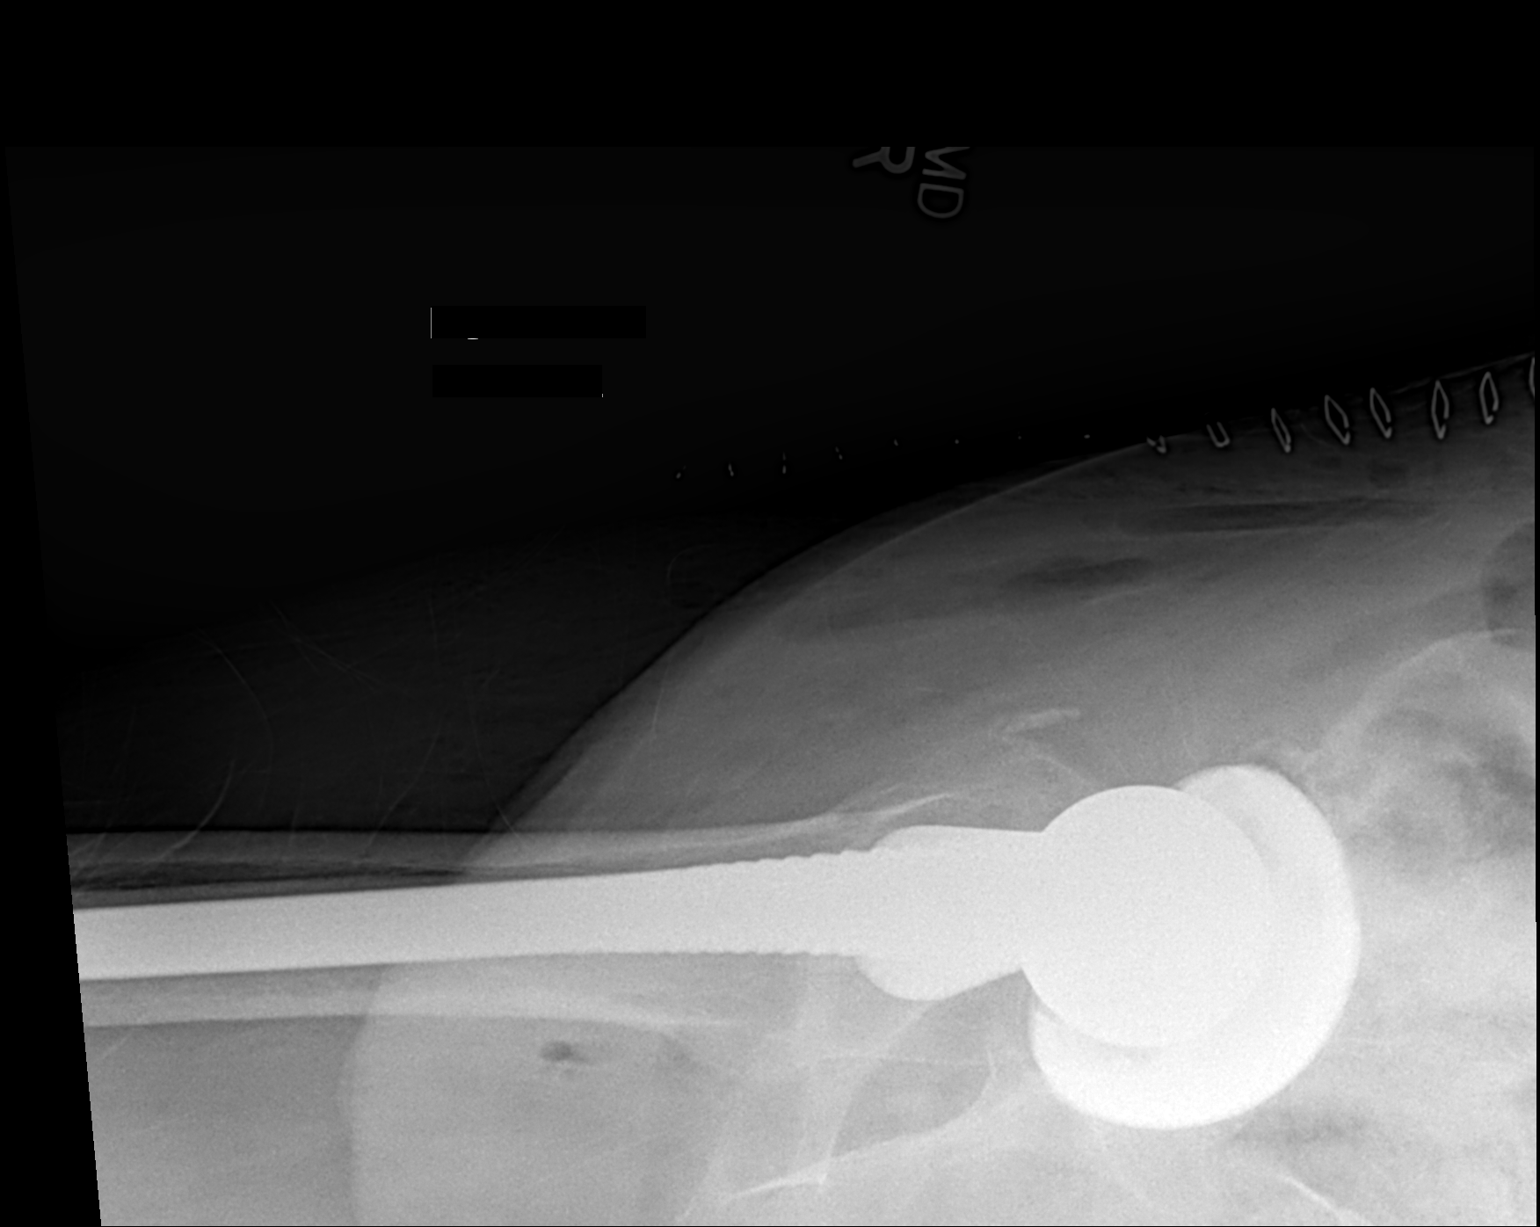

[2 of 2 positions shown; findings below may reference images not displayed]

FINDINGS: Right total hip replacement is in place.  No fracture is
identified.  Gas in the soft tissues and surgical staples are
noted.
IMPRESSION: Right hip replacement without evidence of complication.

## 2012-05-31 IMAGING — CR DG PORTABLE PELVIS
1 series · 1 of 1 positions shown · non-contrast
Comparison: Plain films of the right hip 03/16/2011.

CLINICAL DATA: Status post hip replacement.

PORTABLE PELVIS

[AP]
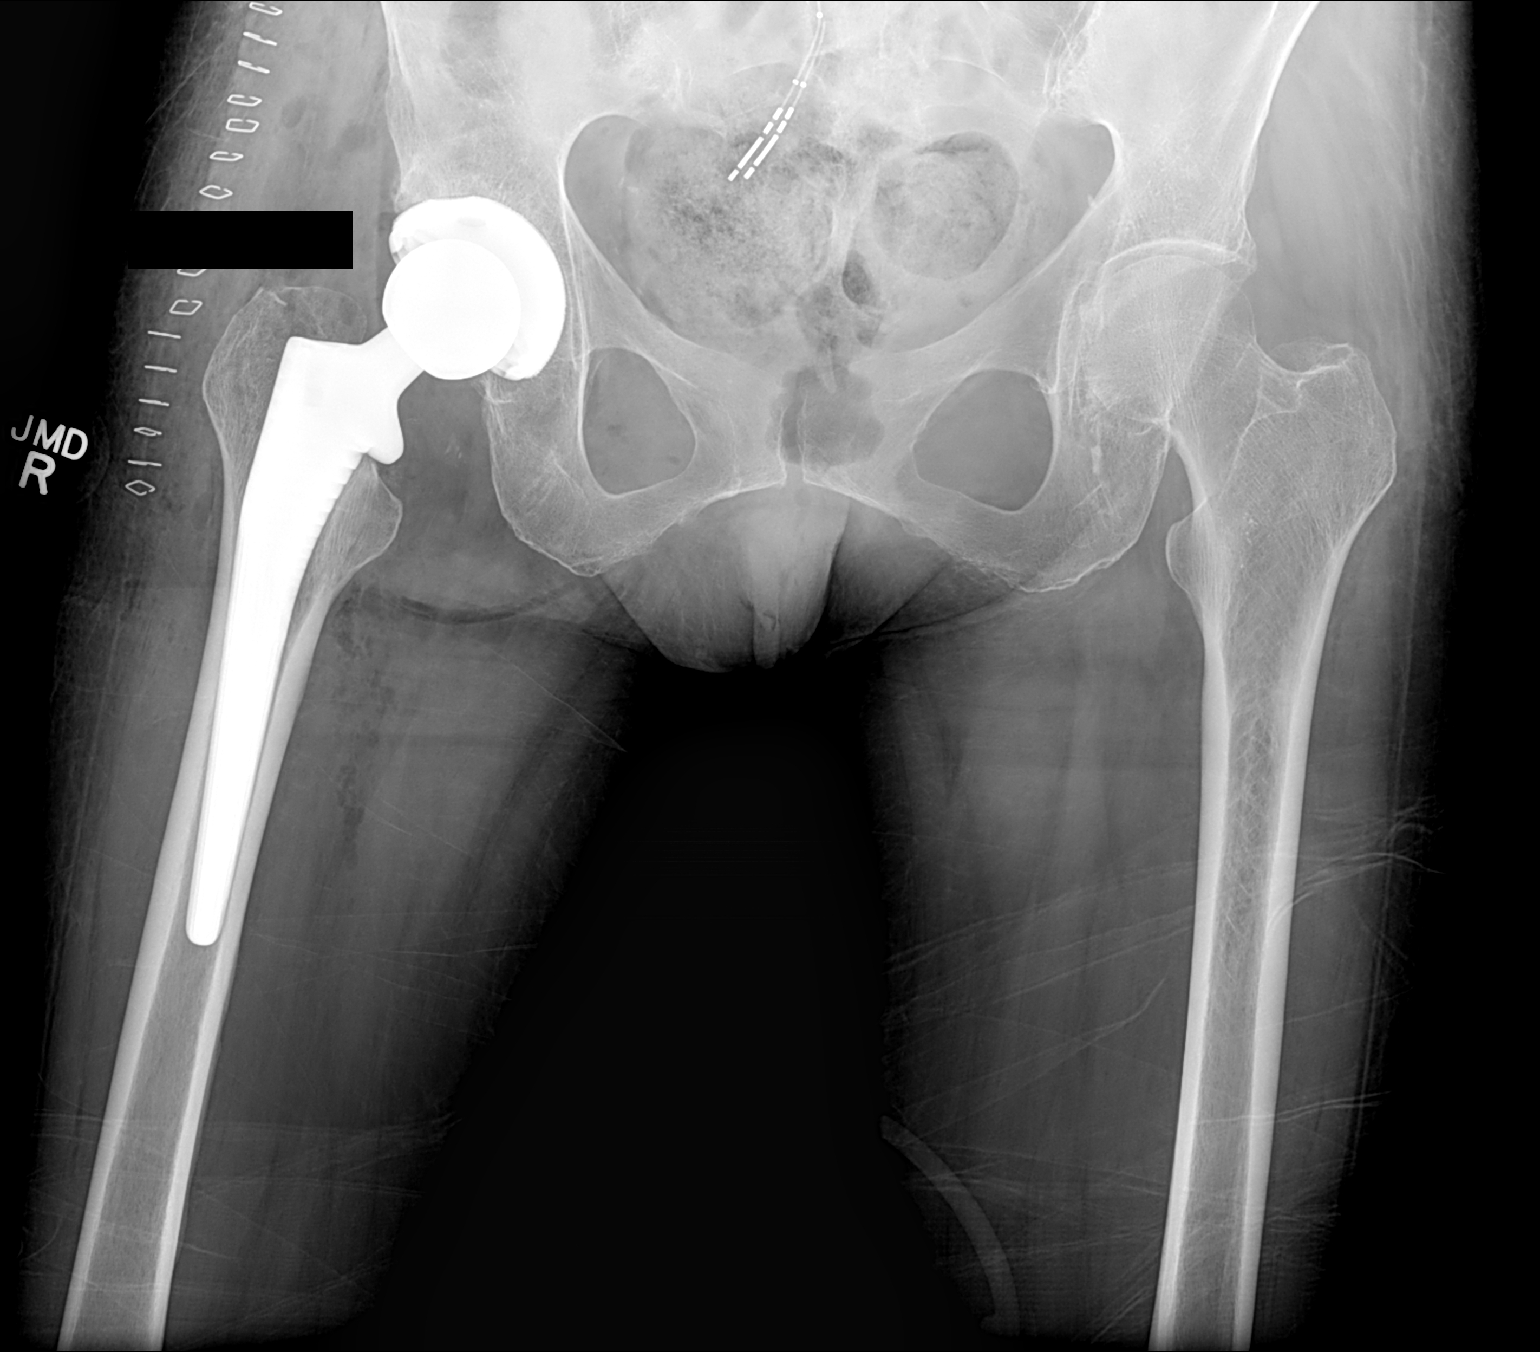

[1 of 1 positions shown; findings below may reference images not displayed]

FINDINGS: The patient has a new right total hip arthroplasty.  The
device is located and there is no fracture.  Gas in the soft
tissues from surgery and surgical staples noted.
IMPRESSION: Right total hip replacement without evidence of complication.

## 2012-05-31 IMAGING — RF DG C-ARM 1-60 MIN-NO REPORT
1 series · 2 of 2 positions shown · non-contrast
Comparison: None.

***ADDENDUM*** CREATED: 08/10/2011 [DATE]

Please disregard the below report. It was assigned to this order
due to administrative error. This order should have no report
attached.
***END ADDENDUM*** SIGNED BY: Asimo Rogai, M.D.
CLINICAL DATA: Hip replacement.
RIGHT HIP - COMPLETE 2+ VIEW and DG C-ARM 1-60 MIN - NRPT [facility]

[Series 1: run · 2 of 2 slices shown]
[im 1/2]
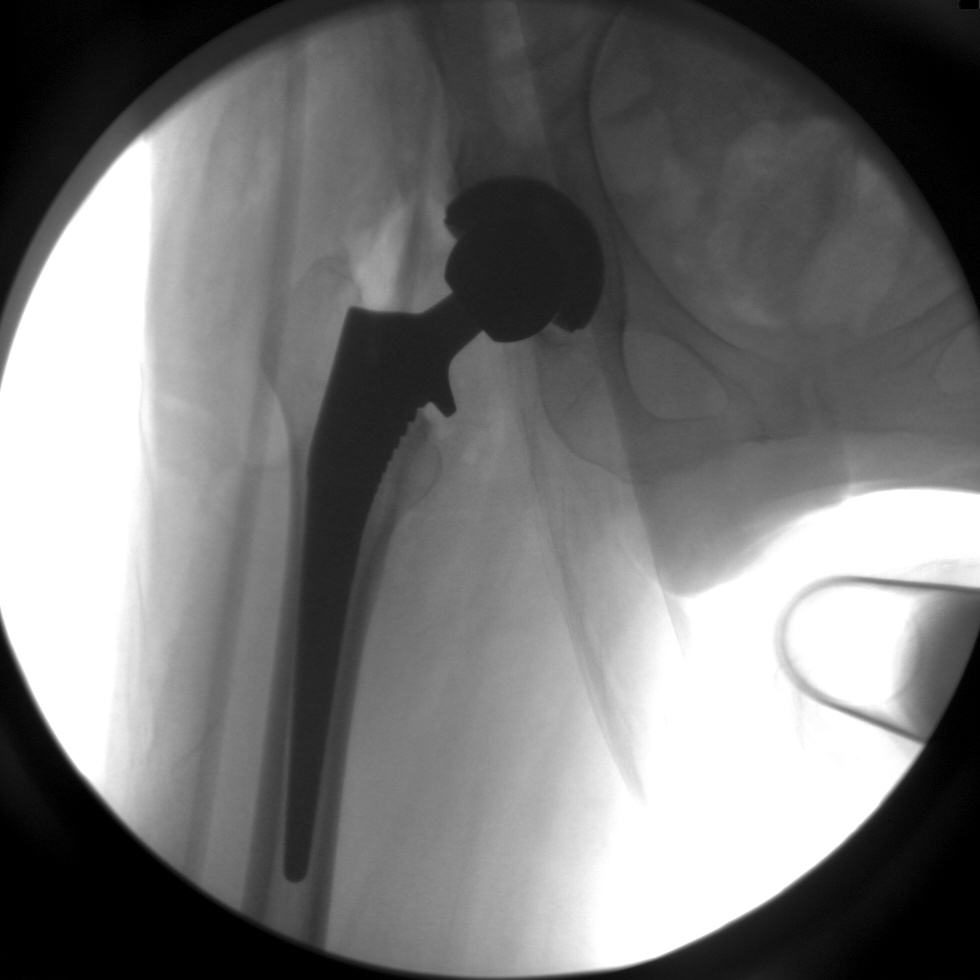
[im 2/2]
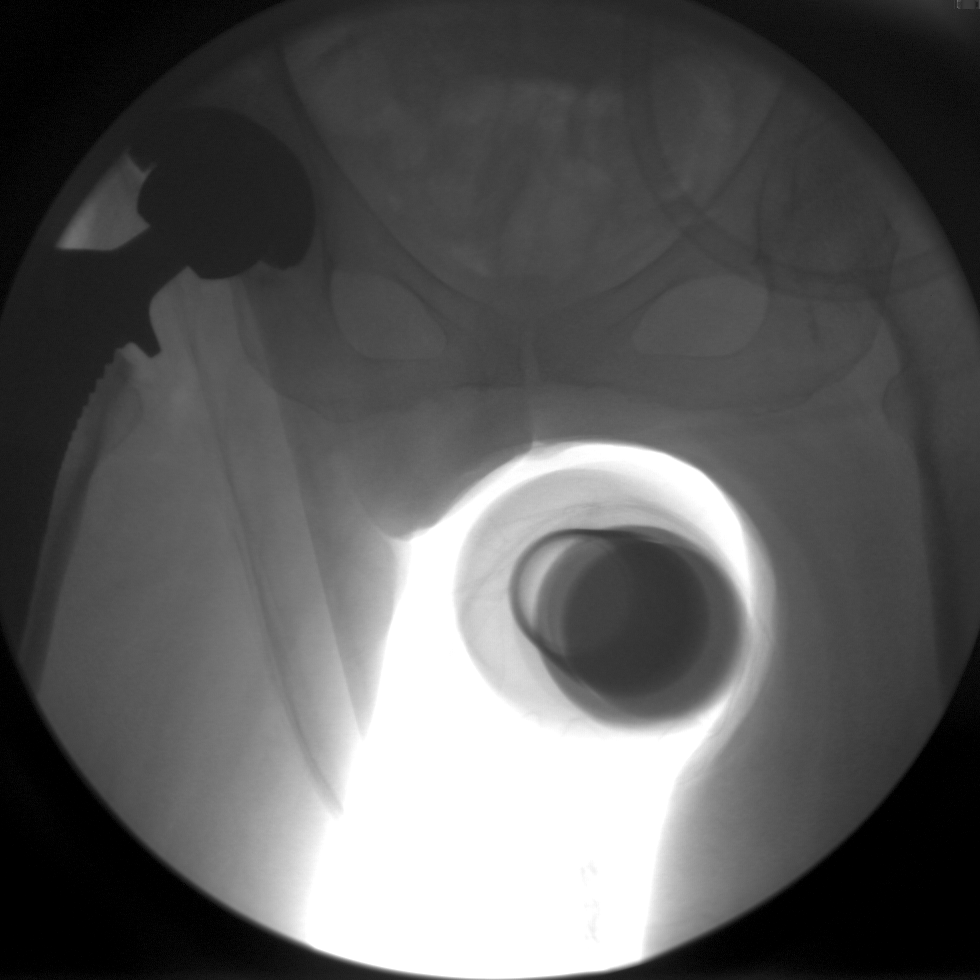

[2 of 2 positions shown; findings below may reference images not displayed]

FINDINGS: We are provided two fluoroscopic spot intraoperative
views of the right hip.  Images demonstrate a total hip replacement
in place.  Device appears located.  No fracture is identified.
IMPRESSION: Right total hip replacement.

## 2012-06-02 ENCOUNTER — Other Ambulatory Visit: Payer: Self-pay | Admitting: Family Medicine

## 2012-06-02 NOTE — Telephone Encounter (Signed)
Last seen 01/29/12 and filled 05/05/12 #60. Please advise     KP

## 2012-06-03 ENCOUNTER — Telehealth: Payer: Self-pay | Admitting: Family Medicine

## 2012-06-03 DIAGNOSIS — R52 Pain, unspecified: Secondary | ICD-10-CM

## 2012-06-04 MED ORDER — FENTANYL 50 MCG/HR TD PT72: 1.0000 | MEDICATED_PATCH | TRANSDERMAL | Status: DC

## 2012-06-04 MED ORDER — OXYCODONE-ACETAMINOPHEN 10-325 MG PO TABS: 1.0000 | ORAL_TABLET | ORAL | Status: DC | PRN

## 2012-06-04 NOTE — Telephone Encounter (Signed)
Patient aware Rx ready for pick up in the morning.     KP

## 2012-06-04 NOTE — Telephone Encounter (Signed)
Last seen 01/29/12 and filled 04/29/12. Please advise      KP

## 2012-06-04 NOTE — Telephone Encounter (Signed)
Ok to refill 

## 2012-06-15 ENCOUNTER — Other Ambulatory Visit: Payer: Self-pay | Admitting: Internal Medicine

## 2012-06-17 ENCOUNTER — Other Ambulatory Visit: Payer: Self-pay | Admitting: Family Medicine

## 2012-06-17 NOTE — Telephone Encounter (Signed)
Last seen 01/29/12 and filled 05/18/12 #60. Please advise      KP

## 2012-06-23 ENCOUNTER — Other Ambulatory Visit: Payer: Self-pay | Admitting: Cardiology

## 2012-07-08 ENCOUNTER — Other Ambulatory Visit: Payer: Self-pay | Admitting: Family Medicine

## 2012-07-09 ENCOUNTER — Telehealth: Payer: Self-pay

## 2012-07-09 ENCOUNTER — Telehealth: Payer: Self-pay | Admitting: Family Medicine

## 2012-07-09 DIAGNOSIS — R52 Pain, unspecified: Secondary | ICD-10-CM

## 2012-07-09 DIAGNOSIS — M810 Age-related osteoporosis without current pathological fracture: Secondary | ICD-10-CM

## 2012-07-09 MED ORDER — OXYCODONE-ACETAMINOPHEN 10-325 MG PO TABS: 1.0000 | ORAL_TABLET | ORAL | Status: DC | PRN

## 2012-07-09 NOTE — Telephone Encounter (Signed)
Pt called to get a refill on her oxycodone medicine. thanks

## 2012-07-09 NOTE — Telephone Encounter (Signed)
Spoke with patient and made her aware prolia benefits verified. She will need a BMP due to elevated Serum calcium levels.  She is aware and will be in to have the labs done. Apt scheduled    KP

## 2012-07-09 NOTE — Telephone Encounter (Signed)
Rx faxed on 4/23. JLT

## 2012-07-09 NOTE — Telephone Encounter (Signed)
Last seen 01/29/12 and filled 05/23/12 #60. Please advise      KP

## 2012-07-09 NOTE — Telephone Encounter (Signed)
Ok to refill 

## 2012-07-09 NOTE — Telephone Encounter (Signed)
Patient aware Rx ready for pick up.      KP 

## 2012-07-10 ENCOUNTER — Ambulatory Visit: Payer: Medicare Other | Admitting: Cardiology

## 2012-07-10 ENCOUNTER — Other Ambulatory Visit: Payer: Medicare Other

## 2012-07-11 ENCOUNTER — Other Ambulatory Visit: Payer: Self-pay | Admitting: Cardiology

## 2012-07-16 ENCOUNTER — Other Ambulatory Visit (INDEPENDENT_AMBULATORY_CARE_PROVIDER_SITE_OTHER): Payer: Medicare Other

## 2012-07-16 DIAGNOSIS — M810 Age-related osteoporosis without current pathological fracture: Secondary | ICD-10-CM

## 2012-07-16 LAB — BASIC METABOLIC PANEL
BUN: 11 mg/dL (ref 6–23)
CO2: 35 mEq/L — ABNORMAL HIGH (ref 19–32)
Calcium: 8.5 mg/dL (ref 8.4–10.5)
Chloride: 99 mEq/L (ref 96–112)
Creatinine, Ser: 0.6 mg/dL (ref 0.4–1.2)
GFR: 101.84 mL/min (ref >60.00)
Glucose, Bld: 87 mg/dL (ref 70–99)
Potassium: 4 mEq/L (ref 3.5–5.1)
Sodium: 139 mEq/L (ref 135–145)

## 2012-07-18 ENCOUNTER — Ambulatory Visit (INDEPENDENT_AMBULATORY_CARE_PROVIDER_SITE_OTHER): Payer: Medicare Other | Admitting: Internal Medicine

## 2012-07-18 ENCOUNTER — Encounter: Payer: Self-pay | Admitting: Internal Medicine

## 2012-07-18 VITALS — BP 140/82 | HR 80 | Ht 60.0 in | Wt 101.0 lb

## 2012-07-18 DIAGNOSIS — K56609 Unspecified intestinal obstruction, unspecified as to partial versus complete obstruction: Secondary | ICD-10-CM

## 2012-07-18 DIAGNOSIS — R195 Other fecal abnormalities: Secondary | ICD-10-CM

## 2012-07-18 DIAGNOSIS — R633 Feeding difficulties, unspecified: Secondary | ICD-10-CM

## 2012-07-18 MED ORDER — LOPERAMIDE HCL 2 MG PO TABS: ORAL_TABLET | ORAL | Status: DC

## 2012-07-18 NOTE — Progress Notes (Signed)
[name] [birth year] MRN [id number]        History of Present Illness:  This is a 67 year old white female with chronic low-grade small bowel obstruction attributed to severe adhesive disease from multiple abdominal surgeries. Last one in March 2011. She has a known stricture in the distal ileum seen on a colonoscopy in October 2012. She had a subtotal colectomy with an anastomosis at 20 cm. She was hospitalized for small bowel obstruction in September 2013. She has chronic iron deficiency anemia and chronic Hemoccult-positive stool. Last iron infusion was in February 2014. Last hemoglobin yesterday was 11. She has maintained her weight since last appointment in February when she weighed 101 pounds, the same as today. Since her last visit she had 2 episodes of small bowel obstruction both resolved at home after she took hydrocodone and stayed on bowel rest. She continues to have diarrhea for which she take Imodium. She has episodic incontinence due to bacterial overgrowth and short bowel syndrome. CT scan of the abdomen in September 2013 showed dilated right ureter, emphysema and chronic mild intrahepatic duct dilatation. She has a history of upper GI bleed G-tube her ducts. She has documented gastric passes and oxygen-dependent emphysema followed by Dr.Sood.    Past Medical History  Diagnosis Date  . GERD (gastroesophageal reflux disease)   . CAD (coronary artery disease)     mild disease per cath in 2011  . Venous embolism and thrombosis of subclavian vein     after pacemaker insertion Oct 2011  . Pleural effusion      s/p right thoracentesis 03/09  . Small bowel obstruction   . Urinary incontinence      -INTERSTIM IMPLANT NOT FUNCTIONING PER PT  . RLS (restless legs syndrome)   . Primary dilated cardiomyopathy     EF 45 to 50% per echo in Jan 2012  . Vitamin B12 deficiency   . Gastroparesis   . Chronic nausea   . Chronic abdominal pain   . Chronic pain syndrome   . Pacemaker      CRT therapy; followed by Dr. Graciela Husbands  . H/O: GI bleed     from Pradaxa  . Oxygen dependent     2 liters via nasal cannula at all times  . CHF (congestive heart failure)   . Anemia   . Hx of stress fx aug 2011    right hip   . Elbow fracture, left aug 2012  . Arthritis     right hip  . Hx of gastric ulcer   . Diverticulitis   . HTN (hypertension)     under control; has been on med. x "years"  . Hx MRSA infection   . Pneumonia - 03/09, 12/09, 02/10, 06/10    MOST RECENT FEB 2013  . Elevated liver enzymes   . Blood transfusion   . Anginal pain   . Shortness of breath   . Fibromyalgia     COUPLE OF TIMES A YEAR-PT HAS EPISODES OF CONFUSION-USUALLY INVOLVES DAY/NIGHT REVERSAL AND EPISODES OF TWITCHING AND FALLS--SEES DR. Modesto Charon - NEUROLOGIST-LAST SEEN 07/10/11  . LBBB (left bundle branch block)   . Atrial fibrillation   . COPD (chronic obstructive pulmonary disease)     continuous O2- 2l  . Hepatitis     hx of in high school    Past Surgical History  Procedure Laterality Date  . Appendectomy    . Pacemaker placement  12/21/2009  . Peg removed      with complications  .  Tonsillectomy  age 67  . Interstim implant revision  03/06/2011    Procedure: REVISION OF Leane Platt;  Surgeon: Martina Sinner, MD;  Location: [facility];  Service: Urology;  Laterality: N/A;  Replacement of Neurostimulator  . Portacath placement  12/16/2009  . Total abdominal hysterectomy      complete  . Interstim implant placement  05/28/2006 - stage I    06/05/2006 - stage II  . Interstim implant revision  10/23/2007  . Cystoscopy with injection  04/30/2006    transurethral collagen injection; incision vaginal stenosis  . Small intestine surgery  05/20/2001    ex. lap., resection of small bowel stricture; gastrostomy; insertion central line  . Cardiac catheterization  04/08/2006, 11/16/2009  . Cystoscopy  11/11/2008  . Cystoscopy w/ retrogrades  11/11/2008    right  . Colectomy      Intestinal resection (5times)   . Colectomy  02/04/2000    ex. lap., intra-abd. subtotal colectomy with ileosigmoid colon anastomosies and lysis of adhesions  . Gastrocutaneous fistula closure    . Exploratory laparotomy  04/27/2009    lysis of adhesions, gastrostomy tube  . Port-a-cath removal  07/27/2011    Procedure: REMOVAL PORT-A-CATH;  Surgeon: Emelia Loron, MD;  Location: [facility];  Service: General;  Laterality: N/A;  . Total hip arthroplasty  08/03/2011    Procedure: TOTAL HIP ARTHROPLASTY ANTERIOR APPROACH;  Surgeon: Kathryne Hitch, MD;  Location: [facility];  Service: Orthopedics;  Laterality: Right;  . Interstim implant placement  02/05/2012    Procedure: INTERSTIM IMPLANT FIRST STAGE;  Surgeon: Martina Sinner, MD;  Location: [facility];  Service: Urology;  Laterality: N/A;  Replacement of Interstim Lead       reports that she quit smoking about 26 years ago. She has never used smokeless tobacco. She reports that she does not drink alcohol or use illicit drugs. family history includes Breast cancer in her maternal aunt; Diabetes in her paternal grandfather; Heart disease in her brother, father, mother, and paternal aunt; and Stroke in her mother.  There is no history of Colon cancer. Allergies  Allergen Reactions  . Dabigatran Etexilate Mesylate Other (See Comments)    INTERNAL BLEEDING-pradaxa  . Talwin (Pentazocine) Other (See Comments)    hallucinations  . Pentazocine Lactate Other (See Comments)    HALLUCINATIONS  . Sulfonamide Derivatives Other (See Comments)    JITTERINESS         Review of Systems:Episodic nausea vomiting and abdominal pain consistent with small bowel obstruction  The remainder of the 10 point ROS is negative except as outlined in H&P   Physical Exam: General appearance  Well developed, in no distress.Chronically ill appearing but oxygen tent and nasal prongs  Eyes- non icteric. HEENT nontraumatic, normocephalic. Mouth no lesions, tongue  papillated, no cheilosis. Neck supple without adenopathy, thyroid not enlarged, no carotid bruits, no JVD. Lungs Clear to auscultation bilaterally. Cor normal S1, normal S2, regular rhythm, no murmur,  quiet precordium. Abdomen: Mildly distended and tympanitic but abnormal rushes of bowel sounds. Minimal tenderness left middle quadrant. Multiple scars.  Rectal:Note done. Extremities no pedal edema. Skin no lesions. Neurological alert and oriented x 3. Psychological normal mood and affect.  Assessment and Plan:  Problem #43 67 year old white female with chronic low-grade small bowel obstruction due to benign disease. She has a chronic stricture in the distal ileum seen on a colonoscopy. She is on a low-residue diet. She is not a surgical candidate at this point. We have extensively  discussed her dietary modifications, specifically diminished in any fiber intake. She takes hydrocodone for the episodes of abdominal pain.to avoid having to come to ED.  Problem #2 Chronic diarrhea due to short bowel syndrome and bacterial overgrowth. We will refill her Imodium 2 mg, take 1/2 when necessary. I will see her in 3 months.  Problem #3 Chronic Iron deficiency anemia and chronic GI blood loss. She is up-to-date on her iron infusion.   07/18/2012 [name]

## 2012-07-18 NOTE — Patient Instructions (Addendum)
We have sent the following medications to your pharmacy for you to pick up at your convenience: Imodium  Please follow up with Dr Juanda Chance in 3 months.  1 week before your next scheduled office visit, please go to the basement floor of Dr Regino Schultze office for the following labwork: IBC  CC: Dr Loreen Freud

## 2012-07-22 ENCOUNTER — Telehealth: Payer: Self-pay | Admitting: *Deleted

## 2012-07-22 NOTE — Telephone Encounter (Signed)
Refill x 3 months 

## 2012-07-22 NOTE — Telephone Encounter (Signed)
Last filled 06-17-12 #60, last OV 01-29-12

## 2012-07-23 MED ORDER — CYCLOBENZAPRINE HCL 10 MG PO TABS: ORAL_TABLET | ORAL | Status: DC

## 2012-07-24 ENCOUNTER — Ambulatory Visit (INDEPENDENT_AMBULATORY_CARE_PROVIDER_SITE_OTHER): Payer: Medicare Other

## 2012-07-24 DIAGNOSIS — M810 Age-related osteoporosis without current pathological fracture: Secondary | ICD-10-CM

## 2012-07-24 MED ORDER — CYANOCOBALAMIN 1000 MCG/ML IJ SOLN: 1000.0000 ug | Freq: Once | INTRAMUSCULAR | Status: DC

## 2012-07-24 MED ORDER — DENOSUMAB 60 MG/ML ~~LOC~~ SOLN
60.0000 mg | Freq: Once | SUBCUTANEOUS | Status: AC
  Administered 2012-07-24: 60 mg via SUBCUTANEOUS

## 2012-07-25 ENCOUNTER — Ambulatory Visit: Payer: Medicare Other

## 2012-07-28 ENCOUNTER — Ambulatory Visit (INDEPENDENT_AMBULATORY_CARE_PROVIDER_SITE_OTHER): Payer: Medicare Other | Admitting: *Deleted

## 2012-07-28 DIAGNOSIS — E538 Deficiency of other specified B group vitamins: Secondary | ICD-10-CM

## 2012-07-28 MED ORDER — CYANOCOBALAMIN 1000 MCG/ML IJ SOLN
1000.0000 ug | Freq: Once | INTRAMUSCULAR | Status: AC
  Administered 2012-07-28: 1000 ug via INTRAMUSCULAR

## 2012-08-06 ENCOUNTER — Telehealth: Payer: Self-pay | Admitting: General Practice

## 2012-08-06 MED ORDER — TEMAZEPAM 15 MG PO CAPS: ORAL_CAPSULE | ORAL | Status: DC

## 2012-08-06 NOTE — Telephone Encounter (Signed)
Refill with 6 refills

## 2012-08-06 NOTE — Telephone Encounter (Signed)
Pt called stating she needs a refill on her Temazepam. Pt last seen on 01-29-12. Med last filled on 07-08-12 #60 with 0 refills. Pt takes two tablets daily.

## 2012-08-13 ENCOUNTER — Telehealth: Payer: Self-pay | Admitting: Family Medicine

## 2012-08-13 ENCOUNTER — Other Ambulatory Visit: Payer: Self-pay | Admitting: General Practice

## 2012-08-13 DIAGNOSIS — R52 Pain, unspecified: Secondary | ICD-10-CM

## 2012-08-13 MED ORDER — FENTANYL 50 MCG/HR TD PT72: 1.0000 | MEDICATED_PATCH | TRANSDERMAL | Status: DC

## 2012-08-13 MED ORDER — GABAPENTIN 600 MG PO TABS: ORAL_TABLET | ORAL | Status: DC

## 2012-08-13 MED ORDER — OXYCODONE-ACETAMINOPHEN 10-325 MG PO TABS: 1.0000 | ORAL_TABLET | ORAL | Status: DC | PRN

## 2012-08-13 NOTE — Telephone Encounter (Signed)
Patient called requesting rx for fentanyl and oxycodone. Call [phone] when ready for pick up. She also states we should be receiving a request for her gabapentin.

## 2012-08-13 NOTE — Telephone Encounter (Signed)
Patient aware med's ready for pick up.      KP 

## 2012-08-13 NOTE — Telephone Encounter (Signed)
Med filled.  

## 2012-08-13 NOTE — Telephone Encounter (Signed)
Last seen 01/29/12 and 07/09/12 #120. Please advise     KP

## 2012-08-13 NOTE — Telephone Encounter (Signed)
Refill x1 

## 2012-08-14 ENCOUNTER — Ambulatory Visit (INDEPENDENT_AMBULATORY_CARE_PROVIDER_SITE_OTHER): Payer: Medicare Other | Admitting: Cardiology

## 2012-08-14 ENCOUNTER — Encounter: Payer: Self-pay | Admitting: Cardiology

## 2012-08-14 VITALS — BP 115/70 | HR 78 | Ht 60.0 in | Wt 103.3 lb

## 2012-08-14 DIAGNOSIS — R55 Syncope and collapse: Secondary | ICD-10-CM

## 2012-08-14 DIAGNOSIS — I4891 Unspecified atrial fibrillation: Secondary | ICD-10-CM

## 2012-08-14 NOTE — Progress Notes (Signed)
HPI She presents for followup of a nonischemic cardiomyopathy and atrial fibrillation. She has a history of a nonischemic cardiomyopathy though her last ejection fraction was 40-50%. She has a CRT.  Since I last saw her she said she has rare chest pain and has only had to use two NTG. She uses O2 continuously. She does not describe any palpitations or orthostatic symptoms . She had stimulator implant for treatment of bladder incontinence. She has had no further presyncopal episodes.    Allergies  Allergen Reactions  . Dabigatran Etexilate Mesylate Other (See Comments)    INTERNAL BLEEDING-pradaxa  . Talwin (Pentazocine) Other (See Comments)    hallucinations  . Pentazocine Lactate Other (See Comments)    HALLUCINATIONS  . Sulfonamide Derivatives Other (See Comments)    JITTERINESS     Current Outpatient Prescriptions  Medication Sig Dispense Refill  . azelastine (OPTIVAR) 0.05 % ophthalmic solution Place 1 drop into both eyes 2 (two) times daily.  6 mL  12  . calcium-vitamin D (OSCAL WITH D) 500-200 MG-UNIT per tablet Take 1 tablet by mouth 2 (two) times daily.  60 tablet  0  . carvedilol (COREG) 6.25 MG tablet TAKE ONE TABLET BY MOUTH TWICE DAILY WITH  MEALS  60 tablet  5  . cyanocobalamin (,VITAMIN B-12,) 1000 MCG/ML injection Inject 1 mL (1,000 mcg total) into the muscle once.  1 mL  0  . cyclobenzaprine (FLEXERIL) 10 MG tablet TAKE 1 TABLET BY MOUTH THREE TIMES DAILY AS NEEDED FOR MUSCLE SPASMS  60 tablet  2  . denosumab (PROLIA) 60 MG/ML SOLN Inject 60 mg into the skin every 6 (six) months. Twice a year      . fentaNYL (DURAGESIC - DOSED MCG/HR) 50 MCG/HR Place 1 patch (50 mcg total) onto the skin every 3 (three) days.  10 patch  0  . furosemide (LASIX) 20 MG tablet TAKE 1 TABLET BY MOUTH EVERY MORNING  30 tablet  5  . gabapentin (NEURONTIN) 600 MG tablet TAKE 1 TABLET BY MOUTH FOUR TIMES DAILY  120 tablet  2  . loperamide (IMODIUM A-D) 2 MG tablet Take 1/2 tablet by mouth daily  as needed for diarrhea.  30 tablet  2  . nitroGLYCERIN (NITROSTAT) 0.4 MG SL tablet Place 0.4 mg under the tongue every 5 (five) minutes as needed. CHEST PAIN      . oxyCODONE-acetaminophen (PERCOCET) 10-325 MG per tablet Take 1 tablet by mouth every 4 (four) hours as needed. PAIN  120 tablet  0  . pantoprazole (PROTONIX) 40 MG tablet TAKE 1 TABLET BY MOUTH DAILY  30 tablet  5  . promethazine (PHENERGAN) 25 MG tablet Take 1 tablet (25 mg total) by mouth every 8 (eight) hours as needed for nausea.  40 tablet  0  . temazepam (RESTORIL) 15 MG capsule       . temazepam (RESTORIL) 15 MG capsule TAKE 1 TO 2 CAPSULES BY MOUTH EVERY NIGHT AT BEDTIME AS NEEDED FOR SLEEP  60 capsule  5  . tiotropium (SPIRIVA HANDIHALER) 18 MCG inhalation capsule Place 1 capsule (18 mcg total) into inhaler and inhale every morning.  30 capsule  5  . albuterol (PROAIR HFA) 108 (90 BASE) MCG/ACT inhaler Inhale 2 puffs into the lungs every 6 (six) hours as needed for wheezing.  1 Inhaler  5  . [DISCONTINUED] iron dextran complex (INFED) 50 MG/ML injection Please give infed infusion (no test dose needed) over 4 hours per pharmacy calculated dose. Ht: 5'2 Wt: 99  pounds Hgb: 12  1 mL  0   No current facility-administered medications for this visit.    Past Medical History  Diagnosis Date  . GERD (gastroesophageal reflux disease)   . CAD (coronary artery disease)     mild disease per cath in 2011  . Venous embolism and thrombosis of subclavian vein     after pacemaker insertion Oct 2011  . Pleural effusion      s/p right thoracentesis 03/09  . Small bowel obstruction   . Urinary incontinence      -INTERSTIM IMPLANT NOT FUNCTIONING PER PT  . RLS (restless legs syndrome)   . Primary dilated cardiomyopathy     EF 45 to 50% per echo in Jan 2012  . Vitamin B12 deficiency   . Gastroparesis   . Chronic nausea   . Chronic abdominal pain   . Chronic pain syndrome   . Pacemaker     CRT therapy; followed by Dr. Graciela Husbands  .  H/O: GI bleed     from Pradaxa  . Oxygen dependent     2 liters via nasal cannula at all times  . CHF (congestive heart failure)   . Anemia   . Hx of stress fx aug 2011    right hip   . Elbow fracture, left aug 2012  . Arthritis     right hip  . Hx of gastric ulcer   . Diverticulitis   . HTN (hypertension)     under control; has been on med. x "years"  . Hx MRSA infection   . Pneumonia - 03/09, 12/09, 02/10, 06/10    MOST RECENT FEB 2013  . Elevated liver enzymes   . Blood transfusion   . Anginal pain   . Shortness of breath   . Fibromyalgia     COUPLE OF TIMES A YEAR-PT HAS EPISODES OF CONFUSION-USUALLY INVOLVES DAY/NIGHT REVERSAL AND EPISODES OF TWITCHING AND FALLS--SEES DR. Modesto Charon - NEUROLOGIST-LAST SEEN 07/10/11  . LBBB (left bundle branch block)   . Atrial fibrillation   . COPD (chronic obstructive pulmonary disease)     continuous O2- 2l  . Hepatitis     hx of in high school     Past Surgical History  Procedure Laterality Date  . Appendectomy    . Pacemaker placement  12/21/2009  . Peg removed      with complications  . Tonsillectomy  age 23  . Interstim implant revision  03/06/2011    Procedure: REVISION OF Leane Platt;  Surgeon: Martina Sinner, MD;  Location: [facility];  Service: Urology;  Laterality: N/A;  Replacement of Neurostimulator  . Portacath placement  12/16/2009  . Total abdominal hysterectomy      complete  . Interstim implant placement  05/28/2006 - stage I    06/05/2006 - stage II  . Interstim implant revision  10/23/2007  . Cystoscopy with injection  04/30/2006    transurethral collagen injection; incision vaginal stenosis  . Small intestine surgery  05/20/2001    ex. lap., resection of small bowel stricture; gastrostomy; insertion central line  . Cardiac catheterization  04/08/2006, 11/16/2009  . Cystoscopy  11/11/2008  . Cystoscopy w/ retrogrades  11/11/2008    right  . Colectomy      Intestinal resection (5times)  . Colectomy  02/04/2000    ex. lap.,  intra-abd. subtotal colectomy with ileosigmoid colon anastomosies and lysis of adhesions  . Gastrocutaneous fistula closure    . Exploratory laparotomy  04/27/2009    lysis of  adhesions, gastrostomy tube  . Port-a-cath removal  07/27/2011    Procedure: REMOVAL PORT-A-CATH;  Surgeon: Emelia Loron, MD;  Location: [facility];  Service: General;  Laterality: N/A;  . Total hip arthroplasty  08/03/2011    Procedure: TOTAL HIP ARTHROPLASTY ANTERIOR APPROACH;  Surgeon: Kathryne Hitch, MD;  Location: [facility];  Service: Orthopedics;  Laterality: Right;  . Interstim implant placement  02/05/2012    Procedure: INTERSTIM IMPLANT FIRST STAGE;  Surgeon: Martina Sinner, MD;  Location: [facility];  Service: Urology;  Laterality: N/A;  Replacement of Interstim Lead       ROS:   As stated in the HPI and negative for all other systems.  PHYSICAL EXAM BP 115/70  Pulse 78  Ht 5' (1.524 m)  Wt 103 lb 4.8 oz (46.857 kg)  BMI 20.17 kg/m2 GENERAL:  Frail appearing NECK:  No jugular venous distention, waveform within normal limits, carotid upstroke brisk and symmetric, no bruits, no thyromegaly LYMPHATICS:  No cervical, inguinal adenopathy LUNGS:  Clear to auscultation bilaterally BACK:  No CVA tenderness CHEST: CRT pocket intact HEART:  PMI not displaced or sustained,S1 and S2 within normal limits, no S3, no S4, no clicks, no rubs, no murmurs ABD:  Flat, positive bowel sounds normal in frequency in pitch, no bruits, no rebound, no guarding, no midline pulsatile mass, no hepatomegaly, no splenomegaly EXT:  2 plus pulses throughout, no edema, no cyanosis no clubbing  ASSESSMENT AND PLAN   ATRIAL FIBRILLATION -  She is maintaining sinus rhythm. She is off of amiodarone. A She apparently did have some infrequent paroxysms of fibrillation recently on device interrogation.  We will continue to follow this. Certainly with her frailty, iron deficiency anemia and multiple comorbidities  anticoagulation would be higher risk.  CARDIOMYOPATHY, PRIMARY, DILATED -  Her EF is 50%. She is euvolemic.  She will continue the meds as listed.  HYPERTENSION -  The blood pressure is at target. No change in medications is indicated. We will continue with therapeutic lifestyle changes (TLC).

## 2012-08-14 NOTE — Patient Instructions (Addendum)
The current medical regimen is effective;  continue present plan and medications.  Follow up in 1 year with Dr Hochrein.  You will receive a letter in the mail 2 months before you are due.  Please call us when you receive this letter to schedule your follow up appointment.  

## 2012-09-05 ENCOUNTER — Telehealth: Payer: Self-pay | Admitting: Family Medicine

## 2012-09-05 MED ORDER — CYANOCOBALAMIN 1000 MCG/ML IJ SOLN: 1000.0000 ug | Freq: Once | INTRAMUSCULAR | Status: DC

## 2012-09-05 NOTE — Telephone Encounter (Signed)
Patient called requesting that we send rx for b12 to [facility] on [address]. She will bring it in and have the injection done here.

## 2012-09-05 NOTE — Telephone Encounter (Signed)
e

## 2012-09-05 NOTE — Telephone Encounter (Signed)
Yes , refill and have pt come in for labs or we can do it at next ov

## 2012-09-05 NOTE — Telephone Encounter (Signed)
Dr.Lowne please advise if it is ok to send the B-12. No results on the system for more than 1 year. Please advise if this refill is appropriate. Thanks      KP

## 2012-09-09 ENCOUNTER — Ambulatory Visit (INDEPENDENT_AMBULATORY_CARE_PROVIDER_SITE_OTHER): Payer: Medicare Other | Admitting: *Deleted

## 2012-09-09 DIAGNOSIS — E538 Deficiency of other specified B group vitamins: Secondary | ICD-10-CM

## 2012-09-09 MED ORDER — CYANOCOBALAMIN 1000 MCG/ML IJ SOLN
1000.0000 ug | Freq: Once | INTRAMUSCULAR | Status: AC
  Administered 2012-09-09: 1000 ug via INTRAMUSCULAR

## 2012-09-15 ENCOUNTER — Telehealth: Payer: Self-pay | Admitting: Family Medicine

## 2012-09-15 DIAGNOSIS — R52 Pain, unspecified: Secondary | ICD-10-CM

## 2012-09-15 NOTE — Telephone Encounter (Signed)
Last seen 01/29/12 and filled 08/13/12. Please advise     KP

## 2012-09-15 NOTE — Telephone Encounter (Signed)
Patient is calling requesting rx for fentanyl and oxycodone. Call [phone] when ready for pick up.

## 2012-09-15 NOTE — Telephone Encounter (Signed)
Ok to refill both x 1  

## 2012-09-16 MED ORDER — FENTANYL 50 MCG/HR TD PT72: 1.0000 | MEDICATED_PATCH | TRANSDERMAL | Status: DC

## 2012-09-16 MED ORDER — OXYCODONE-ACETAMINOPHEN 10-325 MG PO TABS: 1.0000 | ORAL_TABLET | ORAL | Status: DC | PRN

## 2012-09-16 NOTE — Telephone Encounter (Signed)
Patient aware Rx ready for pick up.      KP 

## 2012-10-08 ENCOUNTER — Other Ambulatory Visit (INDEPENDENT_AMBULATORY_CARE_PROVIDER_SITE_OTHER): Payer: Medicare Other

## 2012-10-08 ENCOUNTER — Other Ambulatory Visit: Payer: Self-pay | Admitting: *Deleted

## 2012-10-08 DIAGNOSIS — D508 Other iron deficiency anemias: Secondary | ICD-10-CM

## 2012-10-08 LAB — IBC PANEL
Iron: 74 ug/dL (ref 42–145)
Saturation Ratios: 34.4 % (ref 20.0–50.0)
Transferrin: 153.7 mg/dL — ABNORMAL LOW (ref 212.0–360.0)

## 2012-10-08 IMAGING — CR DG ABDOMEN 1V
1 series · 1 of 1 positions shown · non-contrast
Comparison: 11/03/2010.

CLINICAL DATA: 66-year-old female abdominal pain nausea vomiting.

ABDOMEN - 1 VIEW

[x abdomen supine]
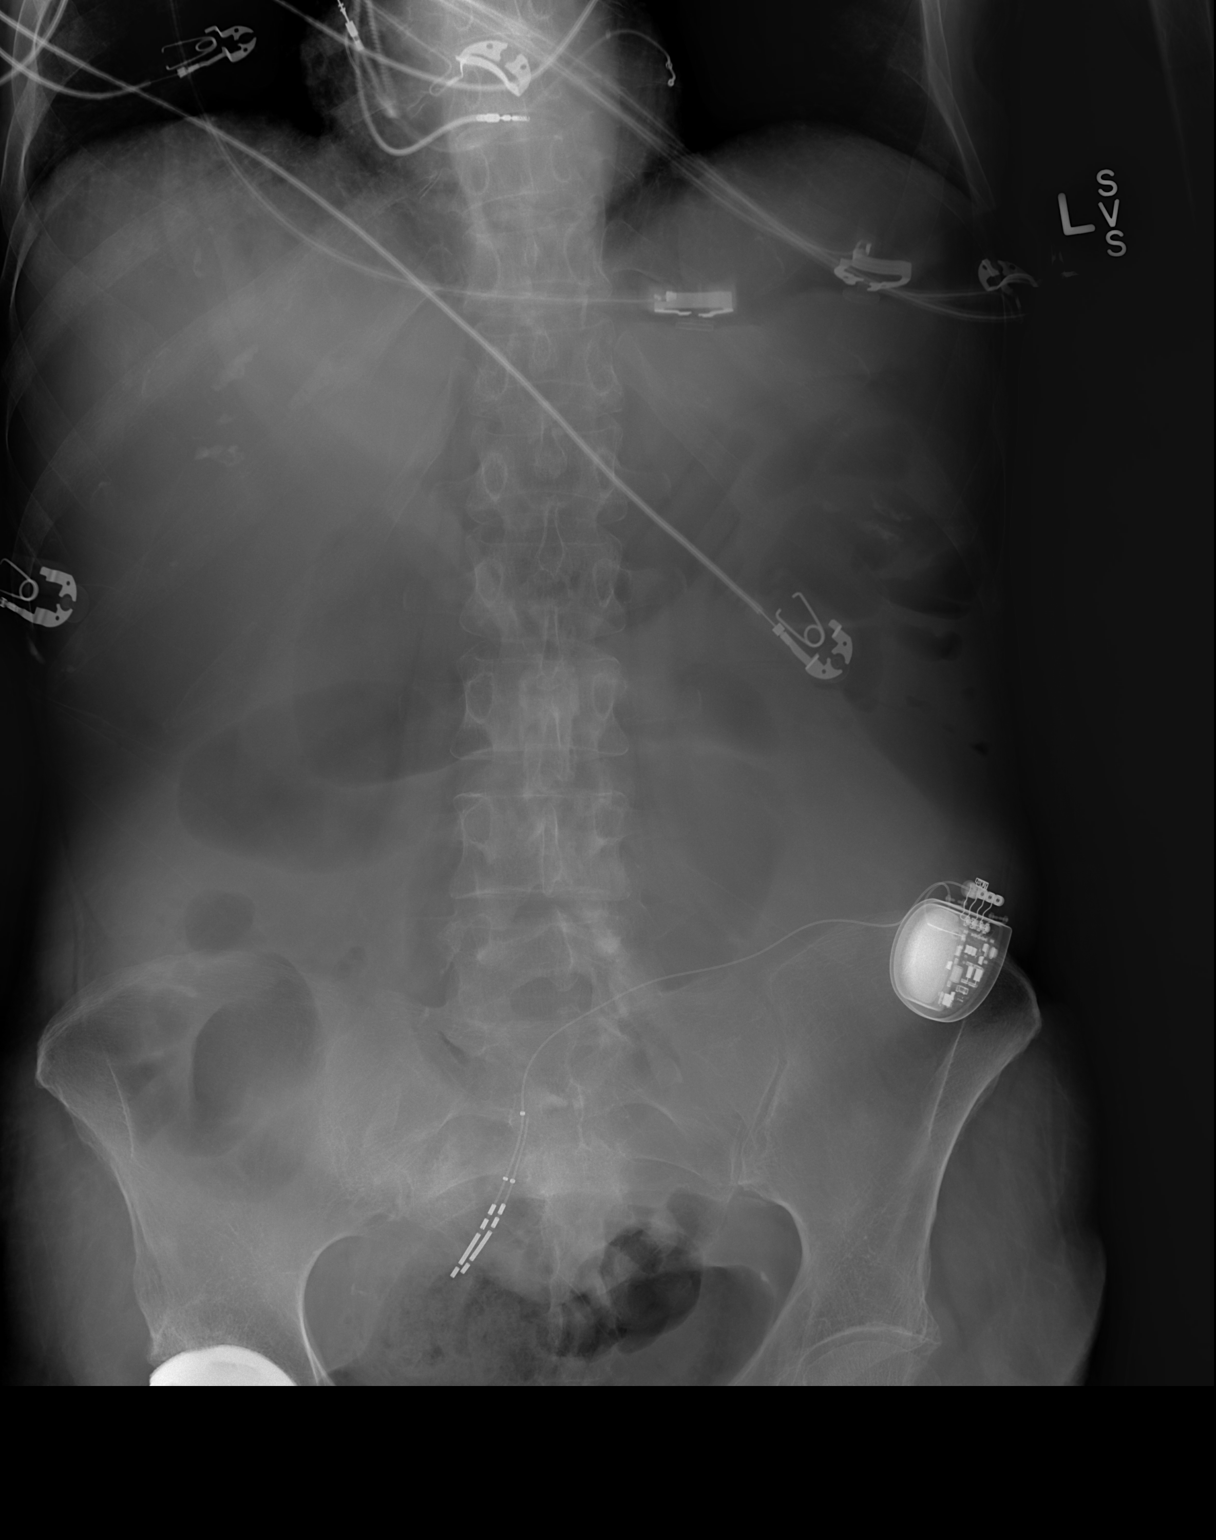

[1 of 1 positions shown; findings below may reference images not displayed]

FINDINGS: Cardiac pacemaker lead partially visible.  Pelvic
stimulator device re-identified.  Interval right hip arthroplasty.
Osteopenia.  Somewhat featureless gas filled bowel loops in the mid
and lower abdomen.  The more normal appearing gas filled bowel in
the pelvis.
IMPRESSION: Featureless loops of gas-filled bowel in the mid abdomen.
Mechanical bowel obstruction cannot be excluded.
Gas pattern in the pelvis is more normal.

## 2012-10-08 IMAGING — CT CT ABD-PELV W/ CM
1 of 3 series · 13 of 32 positions shown, 18 images · IV contrast (OMNIPAQUE 300)
Comparison: Plain film earlier today.  08/08/2010 CT.

CLINICAL DATA: History bowel obstruction.  Last bowel movement 4
days ago.  Nausea vomiting since on the evening.  Hypertension and
COPD.

CT ABDOMEN AND PELVIS WITH CONTRAST
TECHNIQUE: Multidetector CT imaging of the abdomen and pelvis was
performed following the standard protocol during bolus
administration of intravenous contrast.
Contrast: 80mL OMNIPAQUE IOHEXOL 300 MG/ML  SOLN

[Series 2: abd/pel with · axial · 0.65mm/px · z∈[+824,+1190]mm · 13 of 83 slices shown, 18 images]
[im 5/83  soft-tissue]
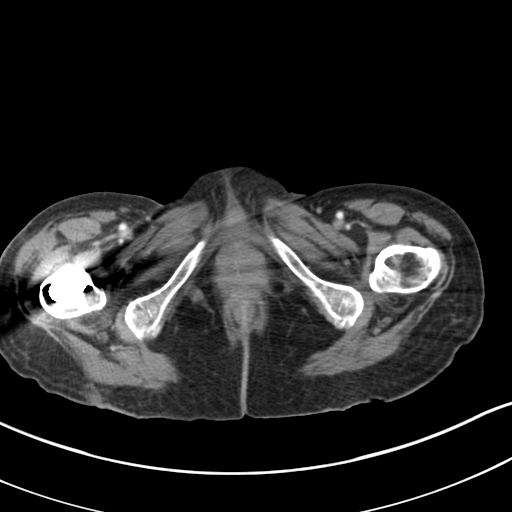
[im 5/83  bone]
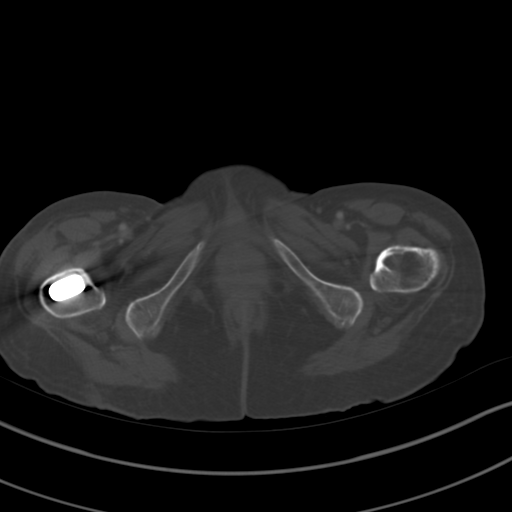
[im 14/83  soft-tissue]
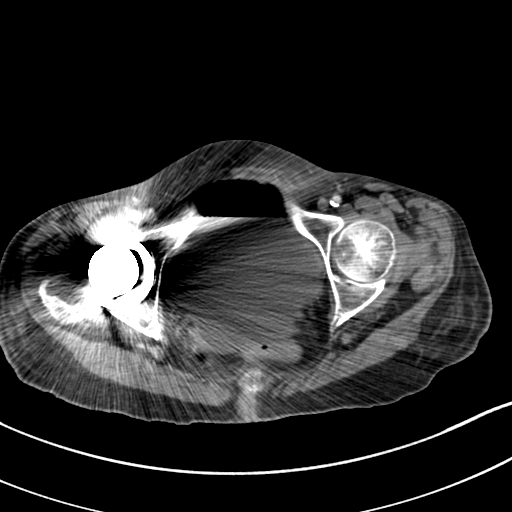
[im 19/83  soft-tissue]
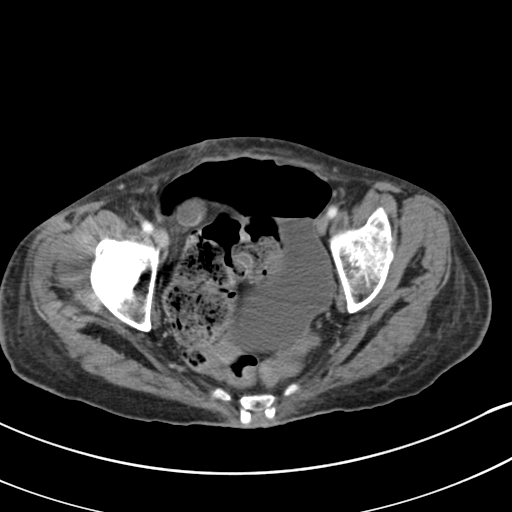
[im 23/83  soft-tissue]
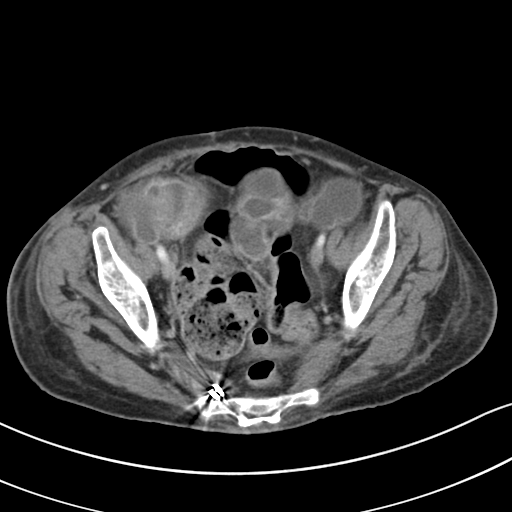
[im 32/83  soft-tissue]
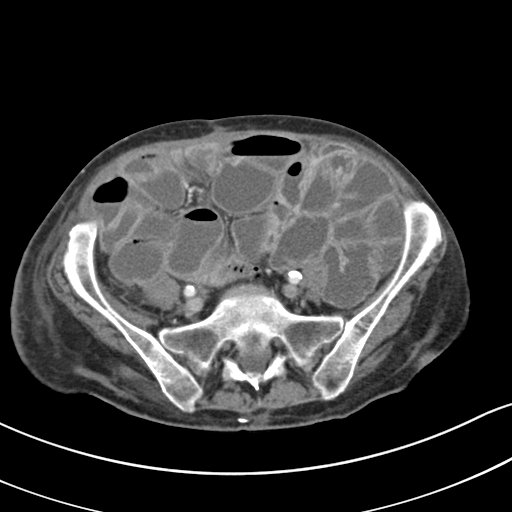
[im 37/83  soft-tissue]
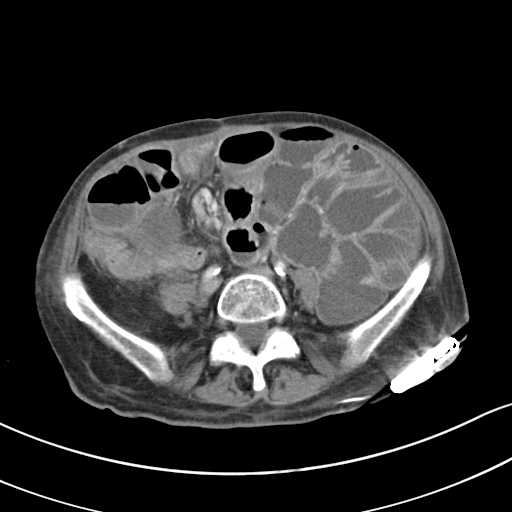
[im 46/83  soft-tissue]
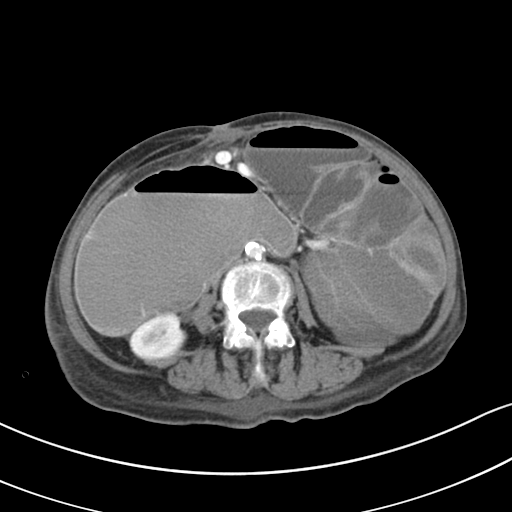
[im 51/83  soft-tissue]
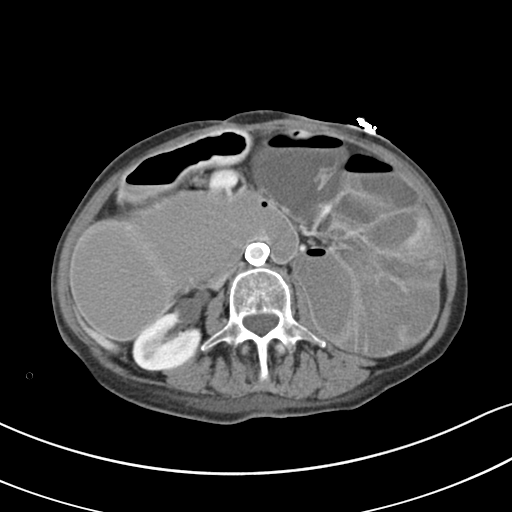
[im 60/83  soft-tissue]
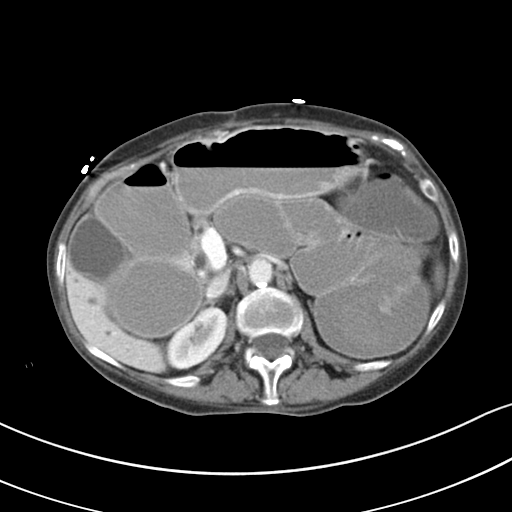
[im 60/83  bone]
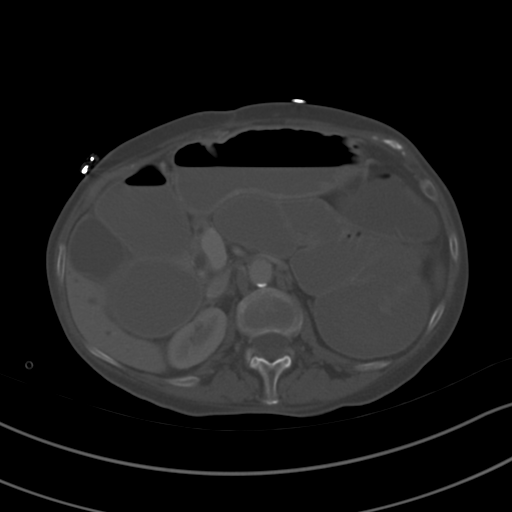
[im 64/83  soft-tissue]
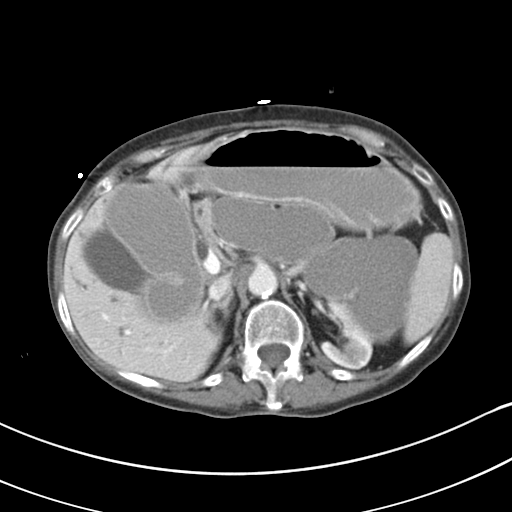
[im 64/83  lung]
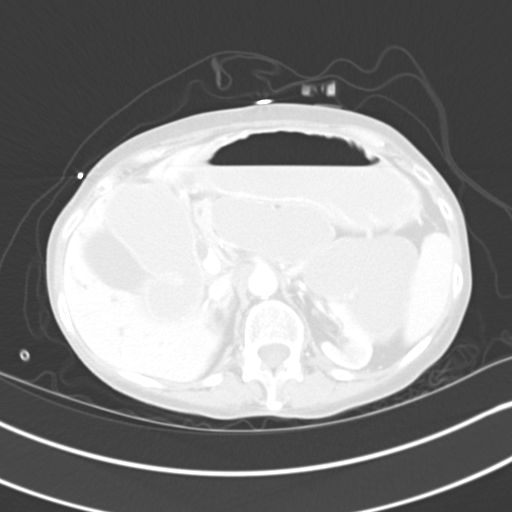
[im 69/83  soft-tissue]
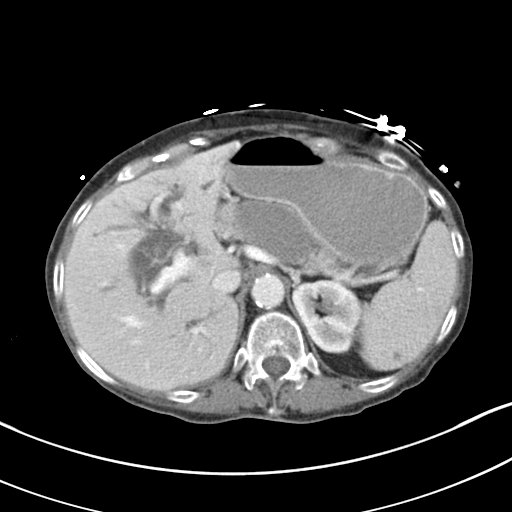
[im 69/83  lung]
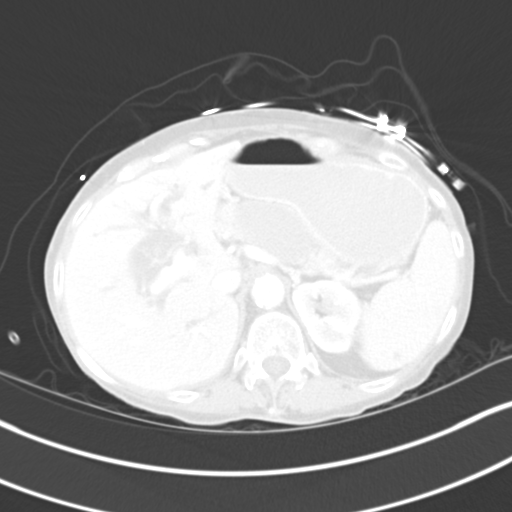
[im 73/83  lung]
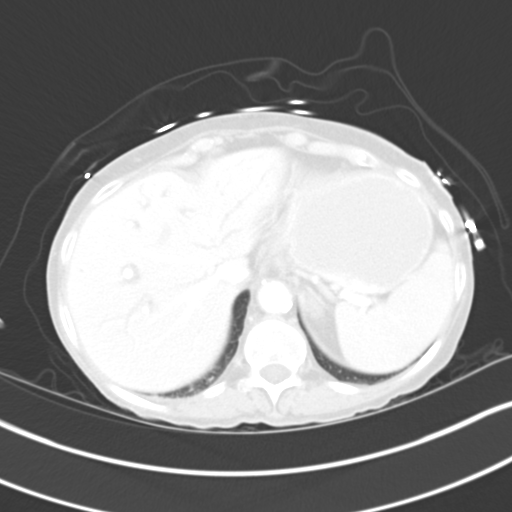
[im 78/83  soft-tissue]
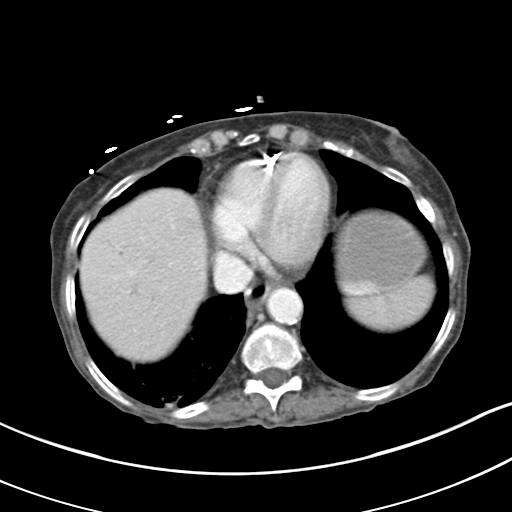
[im 78/83  lung]
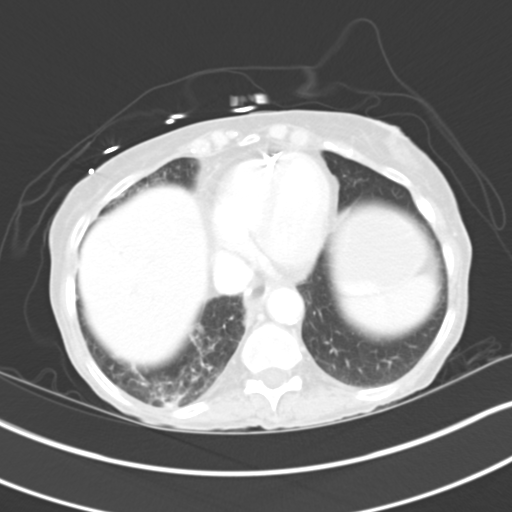

[13 of 32 positions shown; findings below may reference images not displayed]

FINDINGS: Centrilobular emphysema at the lung bases.  Right lung
base interstitial thickening with mild bronchiectasis, slightly
progressive since 08/08/2010.

Normal heart size with dual lead pacer. No pericardial or pleural
effusion.

No focal liver lesion.  Too small to characterize splenic lesion is
unchanged.

Gallbladder is normal.  There is moderate intra hepatic biliary
ductal dilatation.  The common duct is upper normal for patient
age, maximally 7 mm.  No obstructive stone or mass is identified.
The intrahepatic ductal dilatation is likely similar when compared
to the prior unenhanced study.

The stomach is mildly dilated.  The duodenum is markedly dilated.
Normal adrenal glands.  Moderate right renal atrophy with mild
right hydronephrosis which is chronic.  No cause is identified.
Mild left renal cortical atrophy.

No retroperitoneal or retrocrural adenopathy.

The rectum and distal sigmoid are stool-filled.  There has been a
left hemicolectomy.

The proximal jejunum is moderately dilated and fluid-filled.  The
mid small bowel is upper normal in caliber.  A probable transition
in the right lower quadrant on images 59 - 48. No pneumatosis or
free intraperitoneal air.  Beam hardening artifact degrades
evaluation of the pelvis secondary to right hip arthroplasty. No
pelvic adenopathy.  Pelvic floor laxity.  Hysterectomy.  Normal
urinary bladder.  No definite adnexal mass or significant free
pelvic fluid.  Moderate osteopenia.
IMPRESSION: 1.  High-grade partial small bowel obstruction.  Marked duodenal
and moderate proximal small bowel dilatation with probable
transition point in the distal small bowel.  Favor secondary to
adhesions.  The patient would likely benefit from nasogastric tube
placement.
2.  No evidence of ischemia or other acute complication.
3.  Centrilobular emphysema with worsened right base aeration.
Cannot exclude superimposed infection or chronic aspiration.
4.  Chronic intrahepatic biliary ductal dilatation, without cause
identified.
5.  Chronic right-sided hydroureteronephrosis, also without cause
identified.  Mild.

## 2012-10-09 ENCOUNTER — Other Ambulatory Visit: Payer: Self-pay | Admitting: *Deleted

## 2012-10-09 DIAGNOSIS — D509 Iron deficiency anemia, unspecified: Secondary | ICD-10-CM

## 2012-10-09 IMAGING — XA IR US GUIDE VASC ACCESS RIGHT
1 series · 3 of 3 positions shown · non-contrast
Comparison: none

CLINICAL DATA: Crohn's disease.  Poor IV access.

[Series 300: line placements · 3 of 3 slices shown]
[im 1/3]
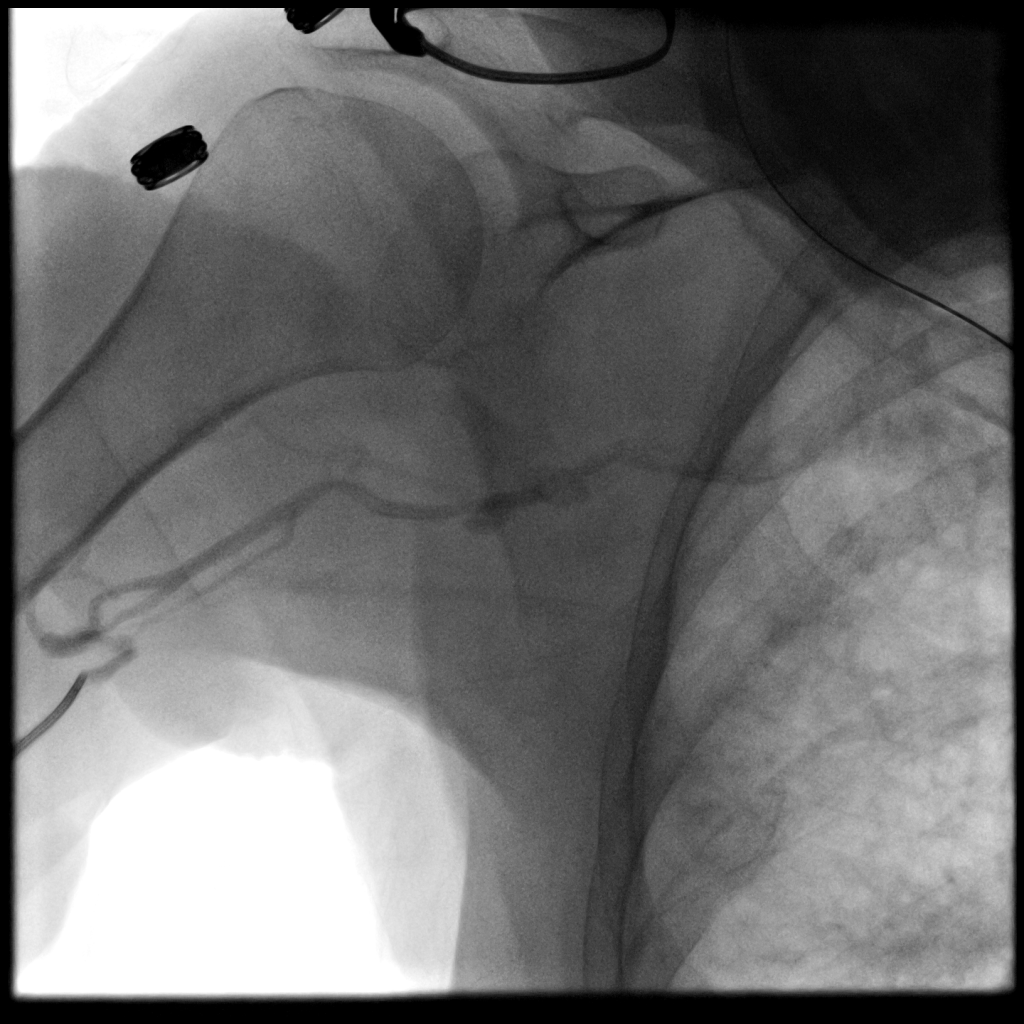
[im 2/3]
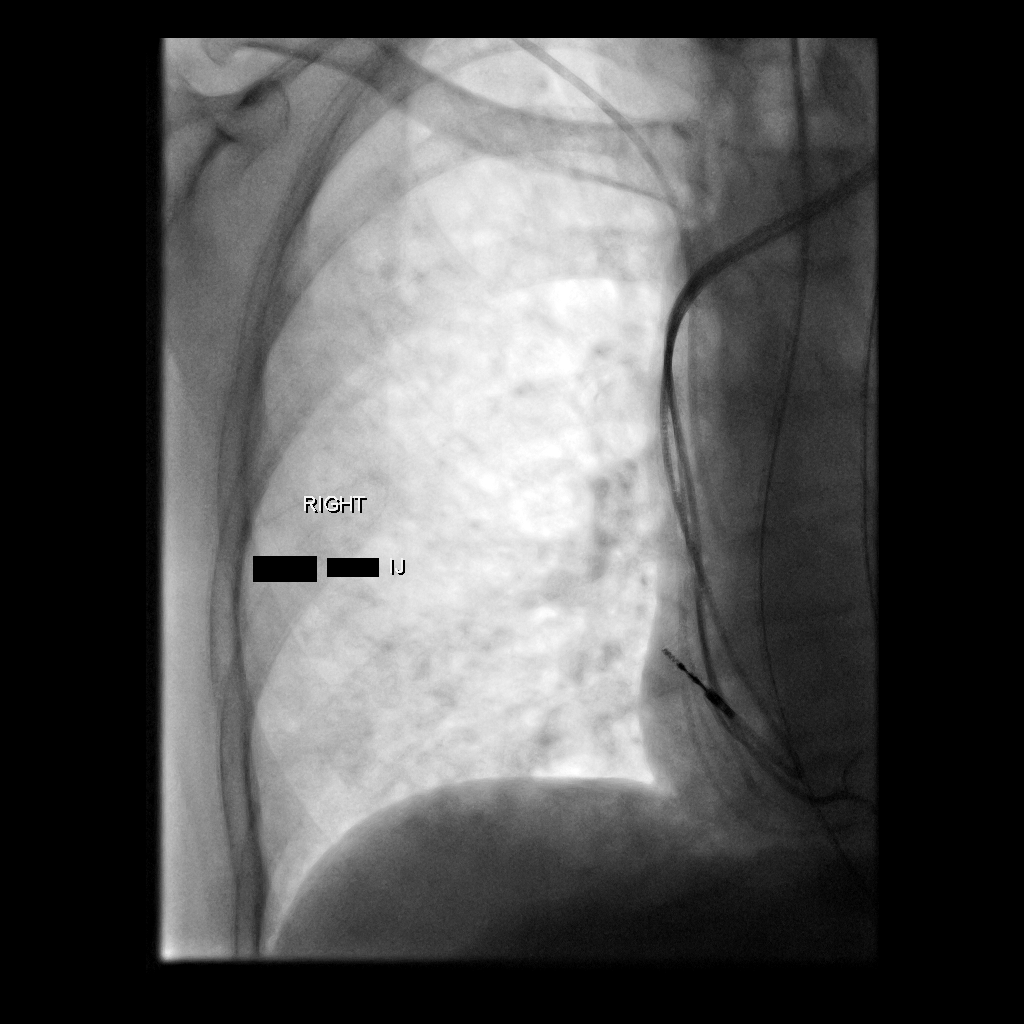
[im 3/3]
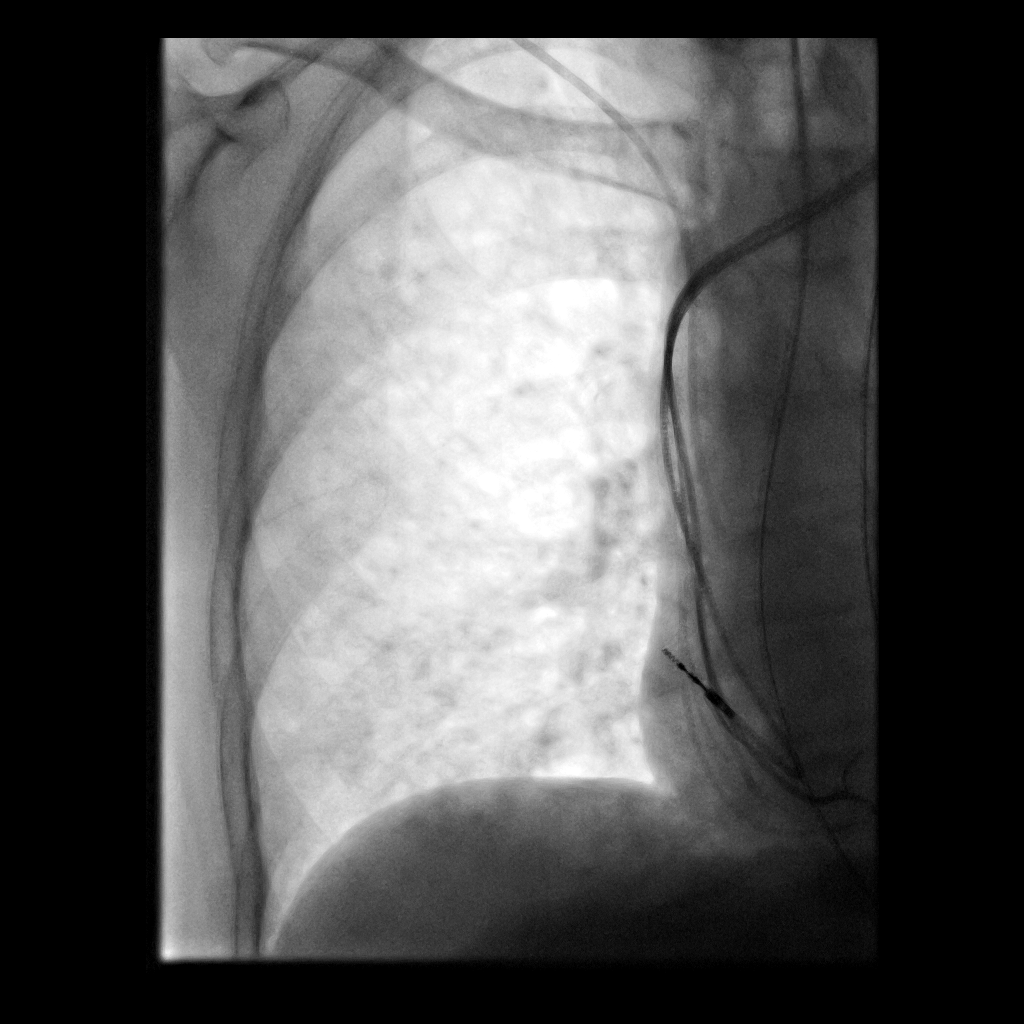

[3 of 3 positions shown; findings below may reference images not displayed]

PICC LINE PLACEMENT WITH ULTRASOUND AND FLUOROSCOPIC  GUIDANCE

Fluoroscopy Time: 11.7 minutes.

Initially, the right arm was prepped and draped in a sterile
fashion and 1% lidocaine was utilized for local infiltration.
Under sonographic guidance, a micropuncture needle was inserted
into the right basilic vein and removed over a 2257 wire.  A 5-
French peel-away sheath was inserted, through which 5-French Kumpe
catheter was advanced.  Venography confirms chronic occlusion of
the brachial vein.  The central axillary vein reconstitutes.  An
attempt was made to access the central venous structures from the
right arm unsuccessfully.

Review of prior computed tomographic examinations confirms chronic
occlusion of the left innominate vein.  The patient therefore has a
chronic bilateral upper extremity venous occlusion.

The right neck was prepped with chlorhexidine, draped in the usual
sterile fashion using maximum barrier technique (cap and mask,
sterile gown, sterile gloves, large sterile sheet, hand hygiene and
cutaneous antisepsis) and infiltrated locally with 1% Lidocaine.

Ultrasound demonstrated patency of the right internal jugular vein,
and this was documented with an image.  Under real-time ultrasound
guidance, this vein was accessed with a 21 gauge micropuncture
needle and image documentation was performed.  The needle was
exchanged over a guidewire for a peel-away sheath through which a
five French double lumen PICC trimmed to 14 cm was advanced,
positioned with its tip at the lower SVC/right atrial junction.
Fluoroscopy during the procedure and fluoro spot radiograph
confirms appropriate catheter position.  The catheter was flushed,
secured to the skin with Prolene sutures, and covered with a
sterile dressing.

Complications:  None
IMPRESSION: Successful right neck internal jugular vein PICC line placement
with ultrasound and fluoroscopic guidance.  The catheter is ready
for use.

The patient has chronic bilateral upper extremity venous occlusion.

## 2012-10-09 IMAGING — CR DG ABDOMEN 1V
1 series · 1 of 1 positions shown · non-contrast
Comparison: 12/11/2011

CLINICAL DATA: Follow up small bowel obstruction

ABDOMEN - 1 VIEW

[view not recorded]
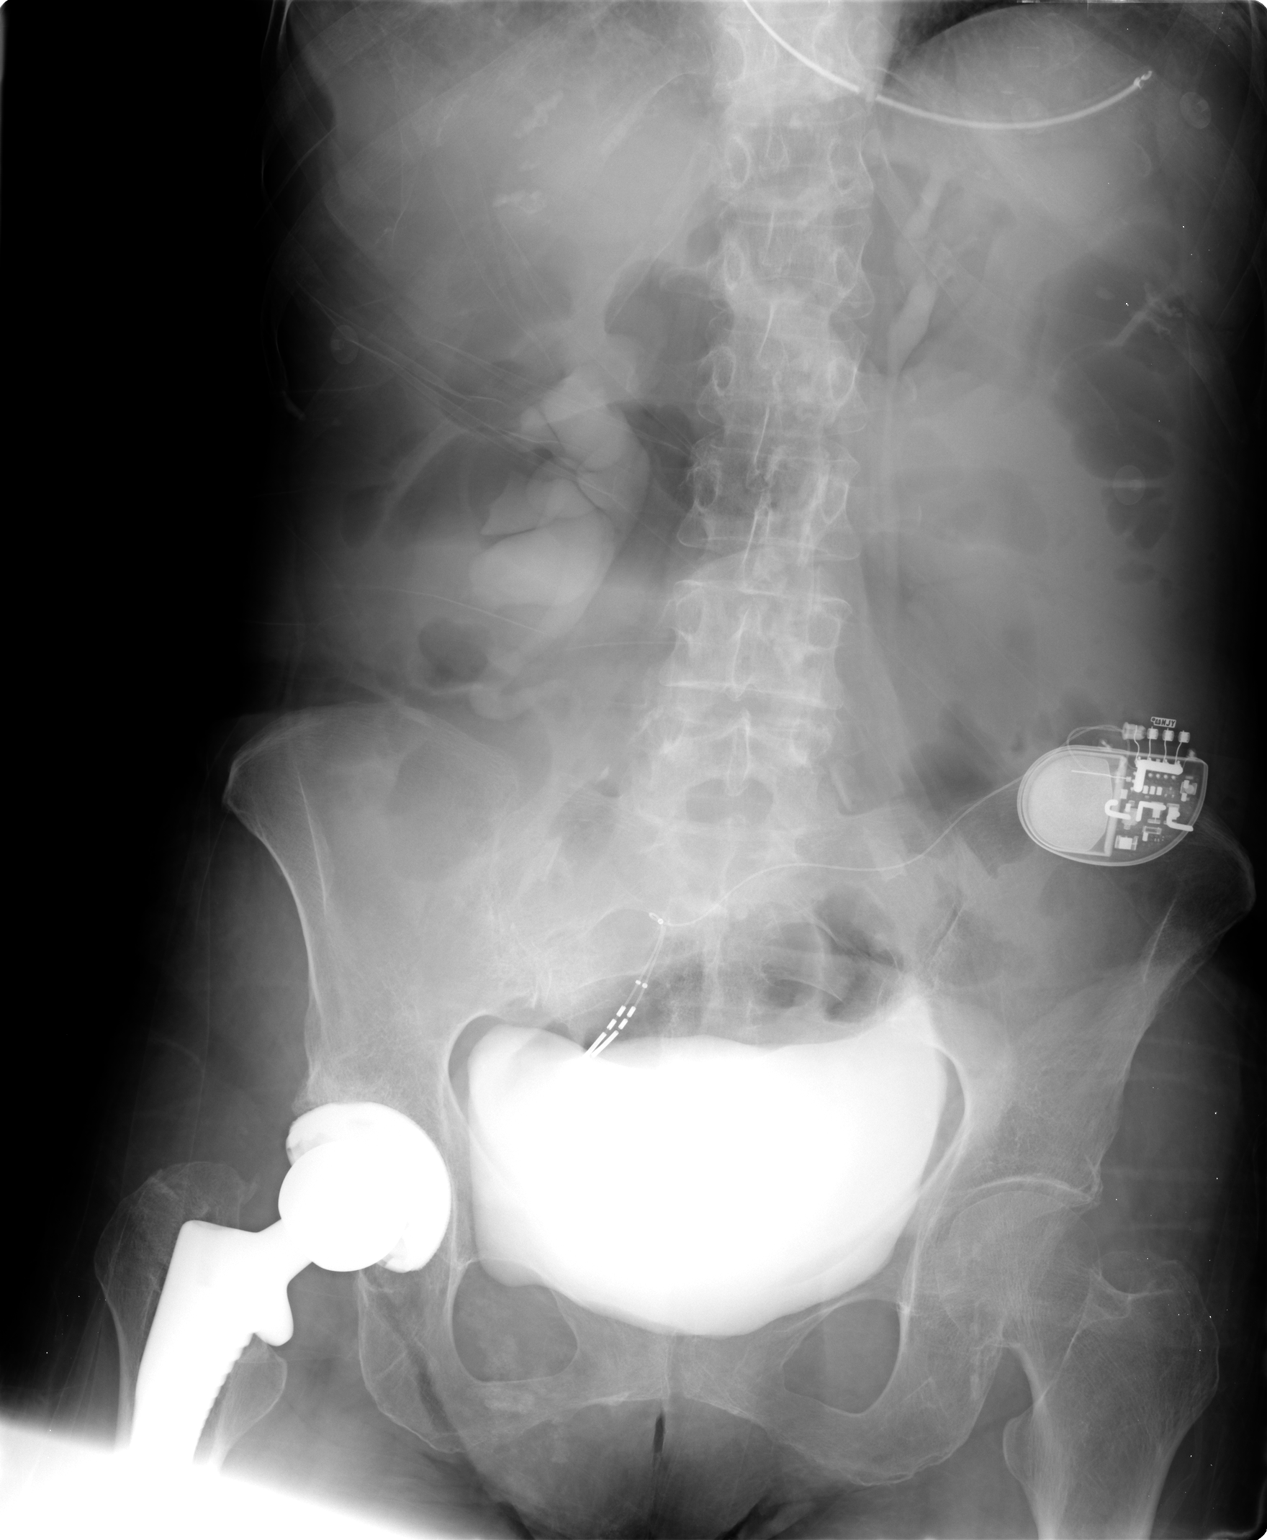

[1 of 1 positions shown; findings below may reference images not displayed]

FINDINGS: There is a nerve stimulator battery pack within the
projection of the left lower quadrant.  The leads terminate in the
region of the right sacrum.

The patient has a right hip arthroplasty device.  The contrast
material is identified within the dilated right renal collecting
system and normal appearing left renal collecting system.  There is
opacification of the urinary bladder.  The bowel gas pattern
appears unchanged from previous exam.  Dilated small bowel loops
within the left lower quadrant of the persist.
IMPRESSION: 1.  No significant change in bowel gas pattern.
2.  Extruded contrast material within the renal collecting systems
and urinary bladder.

## 2012-10-10 IMAGING — CR DG ABDOMEN 2V
2 series · 2 of 2 positions shown · non-contrast
Comparison: 12/12/2011 and CT of the 12/11/2011

CLINICAL DATA: Abdominal pain.  Follow up of small bowel
obstruction.

ABDOMEN - 2 VIEW

[w abdomen decub]
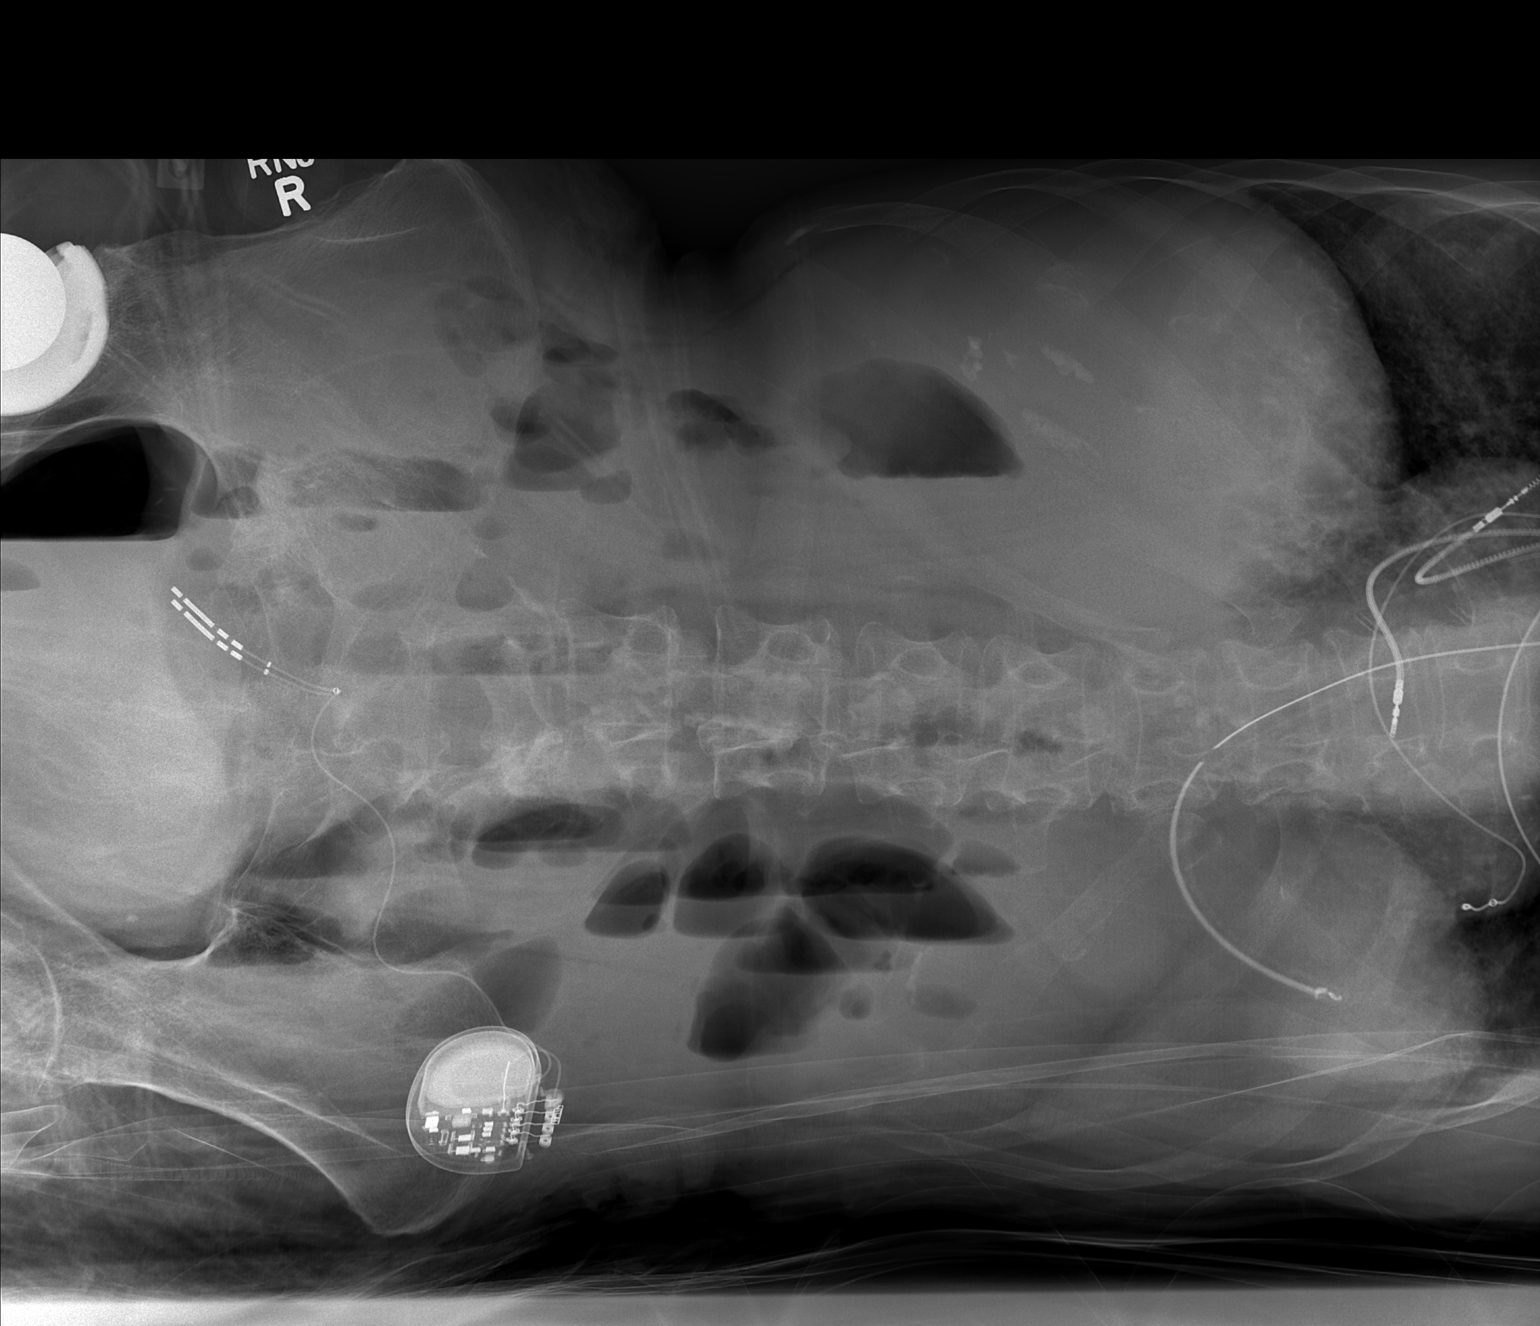

[view not recorded]
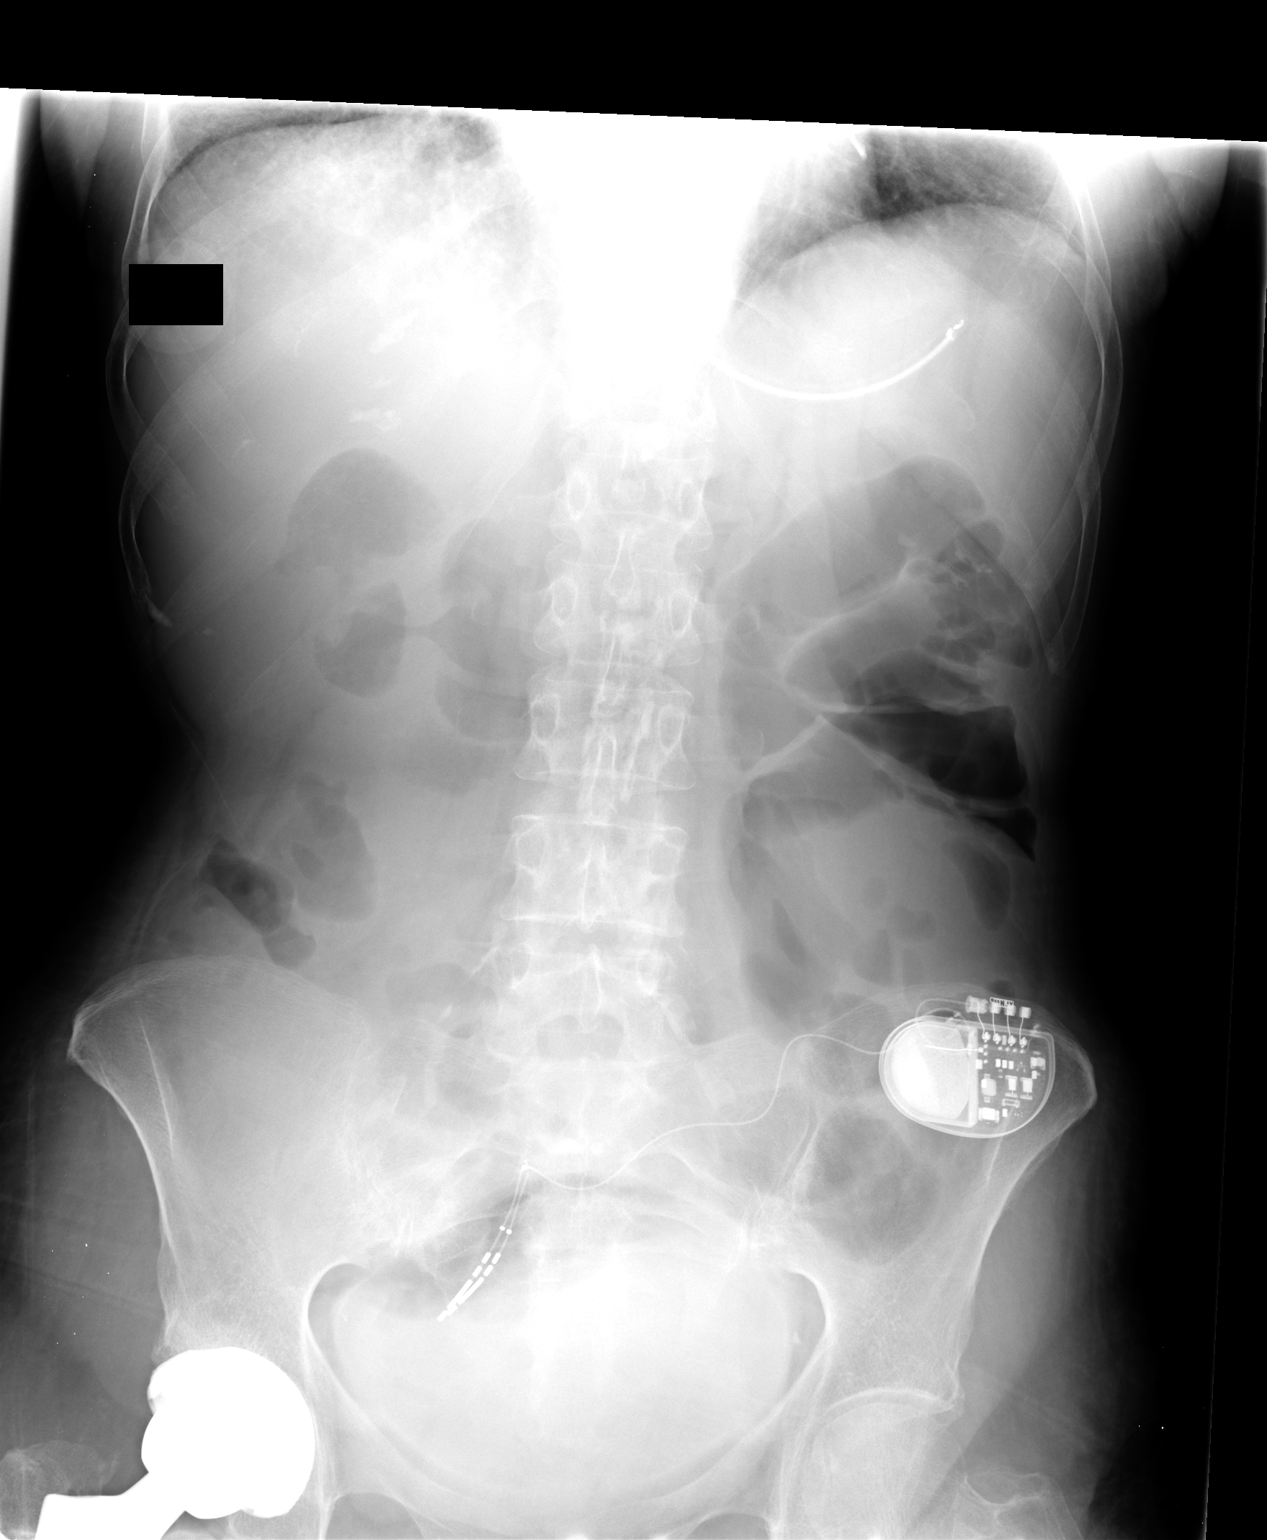

[2 of 2 positions shown; findings below may reference images not displayed]

FINDINGS: Right side up and supine views.  The supine view
demonstrates slight improvement in small bowel distention compared
to the scout film of 12/11/2011.  Left upper quadrant small bowel
loop measures 2.8 cm, only mildly dilated.

Right-sided decubitus view demonstrates numerous small bowel air
fluid levels. No free intraperitoneal air.

Spinal stimulator.

Contrast within urinary bladder.  Right hip arthroplasty.
IMPRESSION: Slight improvement in partial small bowel obstruction.

## 2012-10-11 IMAGING — CR DG ABDOMEN 2V
3 series · 3 of 3 positions shown · non-contrast
Comparison: Yesterday

CLINICAL DATA: Small bowel obstruction

ABDOMEN - 2 VIEW

[w abdomen decub * (1 of 2)]
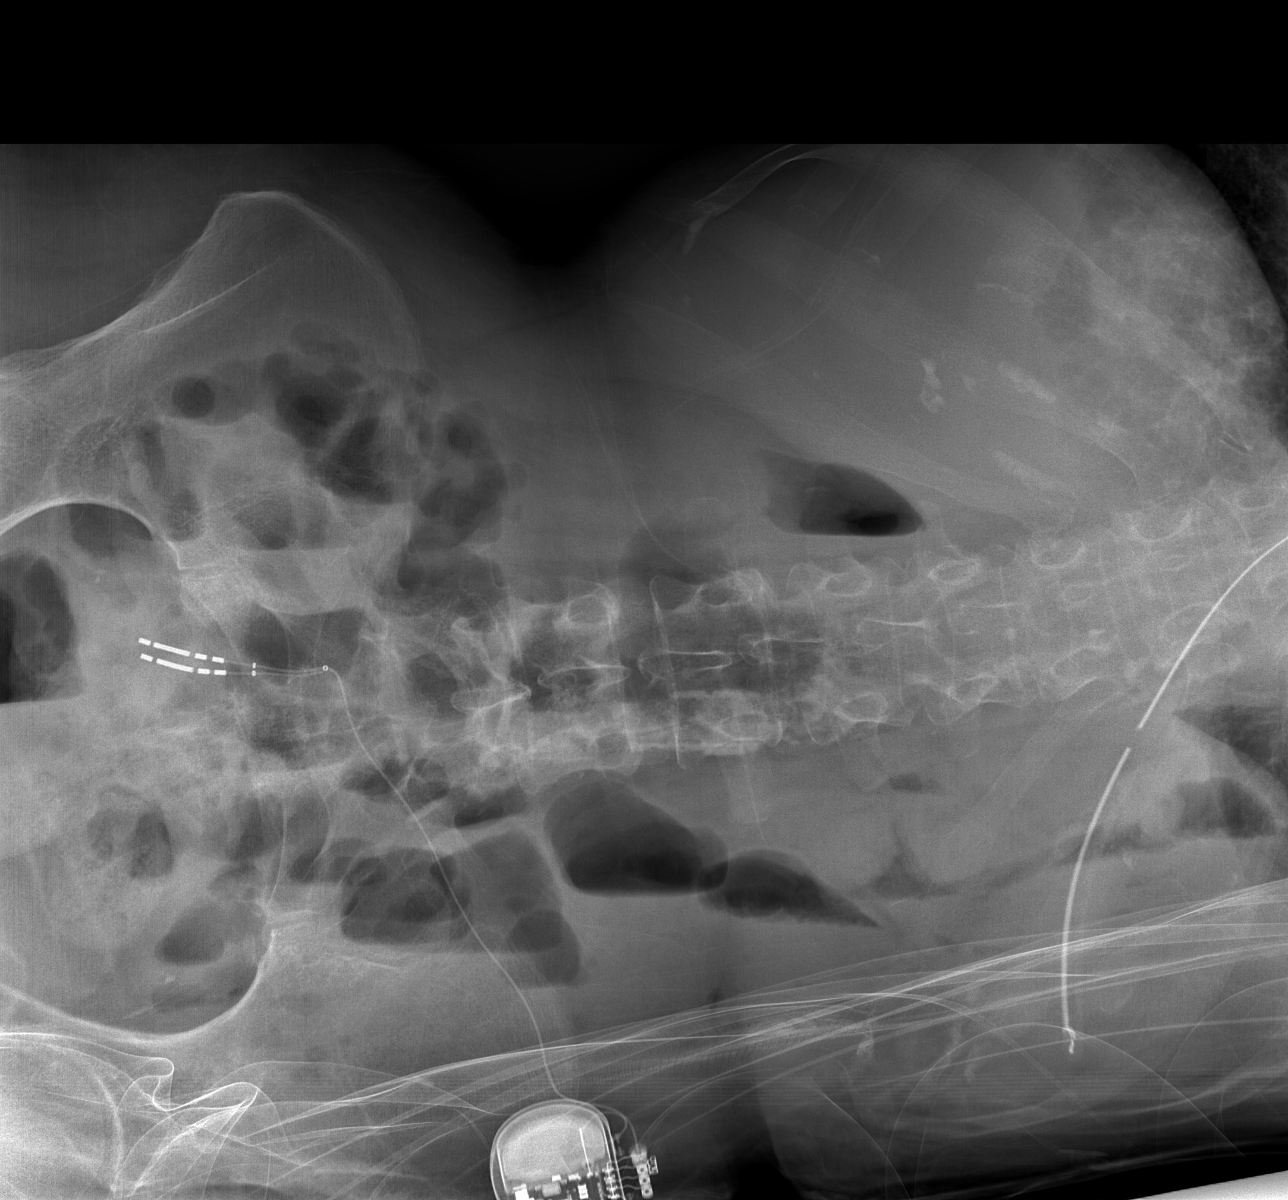

[w abdomen decub * (2 of 2)]
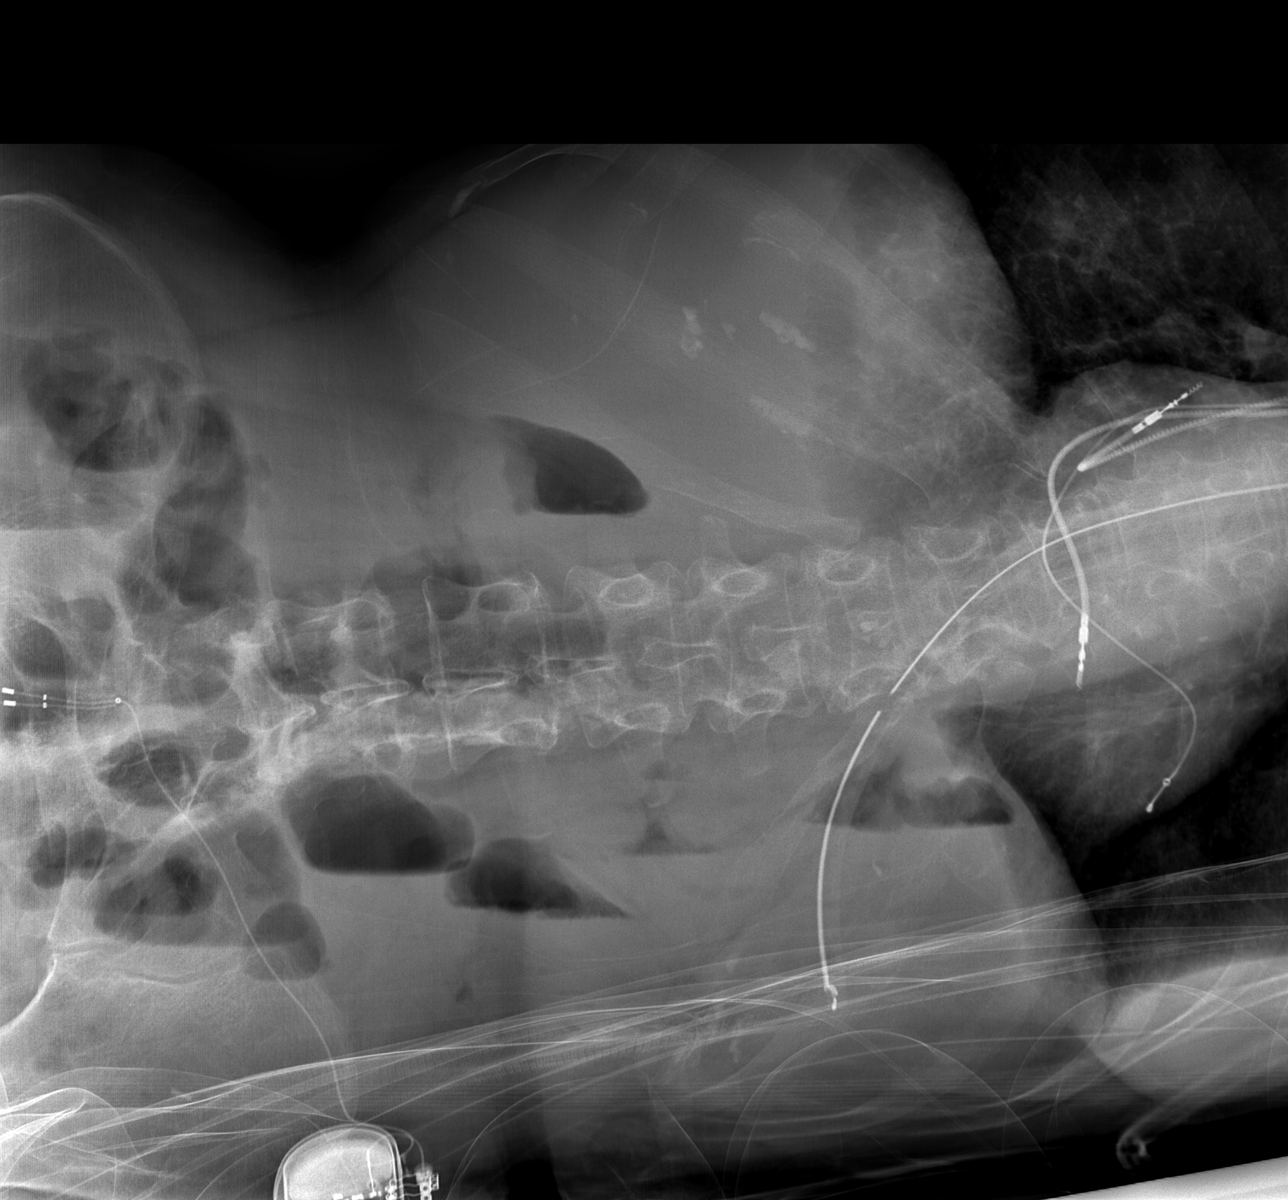

[view not recorded]
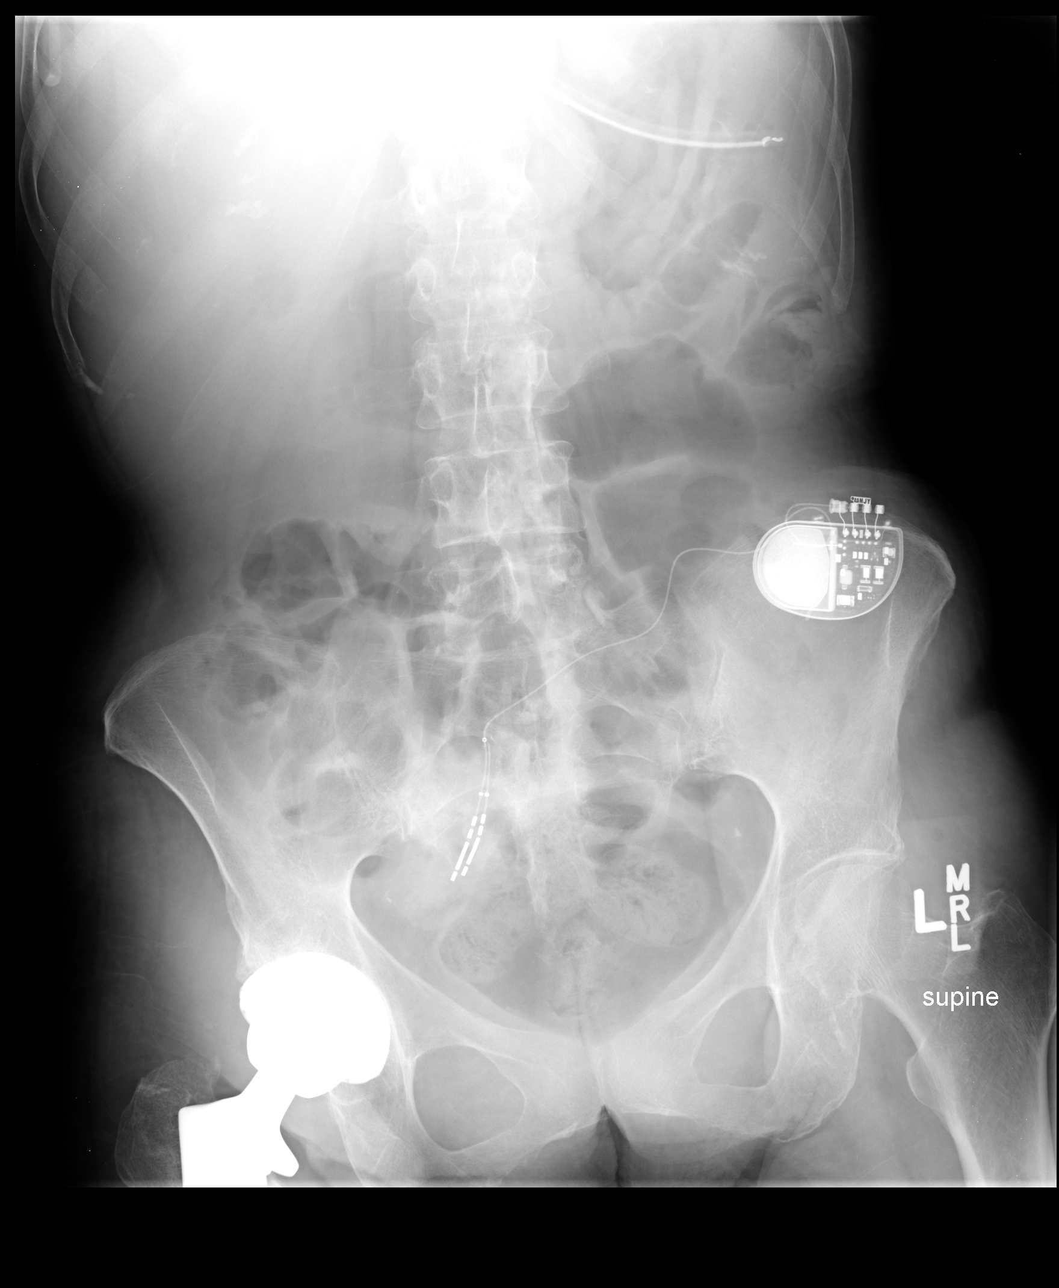

[3 of 3 positions shown; findings below may reference images not displayed]

FINDINGS: Small bowel distention has increased.  Colonic gas is
present.  There is no free intraperitoneal gas.  Stable NG tube
with its tip in the fundus of the stomach.  Air fluid levels
persist.
IMPRESSION: Worsening partial small bowel obstruction pattern.  No free
intraperitoneal gas.

## 2012-10-13 ENCOUNTER — Ambulatory Visit (INDEPENDENT_AMBULATORY_CARE_PROVIDER_SITE_OTHER): Payer: Medicare Other

## 2012-10-13 DIAGNOSIS — E538 Deficiency of other specified B group vitamins: Secondary | ICD-10-CM

## 2012-10-13 MED ORDER — CYANOCOBALAMIN 1000 MCG/ML IJ SOLN
1000.0000 ug | Freq: Once | INTRAMUSCULAR | Status: AC
  Administered 2012-10-13: 1000 ug via INTRAMUSCULAR

## 2012-10-14 ENCOUNTER — Ambulatory Visit (INDEPENDENT_AMBULATORY_CARE_PROVIDER_SITE_OTHER): Payer: Medicare Other | Admitting: Internal Medicine

## 2012-10-14 ENCOUNTER — Encounter: Payer: Self-pay | Admitting: Internal Medicine

## 2012-10-14 VITALS — BP 86/60 | HR 72 | Ht 60.0 in | Wt 102.0 lb

## 2012-10-14 DIAGNOSIS — D509 Iron deficiency anemia, unspecified: Secondary | ICD-10-CM

## 2012-10-14 DIAGNOSIS — K56609 Unspecified intestinal obstruction, unspecified as to partial versus complete obstruction: Secondary | ICD-10-CM

## 2012-10-14 NOTE — Progress Notes (Signed)
[name] 11-16-1945 MRN [id number]        History of Present Illness:  This is a 67 year old, white female with multiple medical problems. This is a 3 months return of office visit. She has a chronic low-grade small bowel obstruction attributed to adhesive disease. She has a history of several abdominal surgeries including subtotal colectomy with anastomosis at 20 cm. She had an unsuccessful attempt for lysis of adhesions in March 2011. She has a known benign stricture of the distal ileum and has had chronic low-grade GI bleed necessitating iron infusions. Last infusion was in February 2014. She is doing very well. Last iron saturation was 34%. She denies visible blood per rectum. She has gained 1 pound. She is eating well. There has been no nausea or vomiting. She has chronic diarrhea likely from bacterial overgrowth or short bowel syndrome. Last CT scan of the abdomen in September 2013 showed dilated right ureter, COPD and mild intrahepatic duct dilation. She has no complaints today, she has oxygen-dependent COPD followed by Dr.Sood and  documented gastroparesis. She has a history of gastrocutaneous fistula and her to fail of PEG to close. The fistula has been healed now for at least a year   Past Medical History  Diagnosis Date  . GERD (gastroesophageal reflux disease)   . CAD (coronary artery disease)     mild disease per cath in 2011  . Venous embolism and thrombosis of subclavian vein     after pacemaker insertion Oct 2011  . Pleural effusion      s/p right thoracentesis 03/09  . Small bowel obstruction   . Urinary incontinence      -INTERSTIM IMPLANT NOT FUNCTIONING PER PT  . RLS (restless legs syndrome)   . Primary dilated cardiomyopathy     EF 45 to 50% per echo in Jan 2012  . Vitamin B12 deficiency   . Gastroparesis   . Chronic nausea   . Chronic abdominal pain   . Chronic pain syndrome   . Pacemaker     CRT therapy; followed by Dr. Graciela Husbands  . H/O: GI bleed     from  Pradaxa  . Oxygen dependent     2 liters via nasal cannula at all times  . CHF (congestive heart failure)   . Anemia   . Hx of stress fx aug 2011    right hip   . Elbow fracture, left aug 2012  . Arthritis     right hip  . Hx of gastric ulcer   . Diverticulitis   . HTN (hypertension)     under control; has been on med. x "years"  . Hx MRSA infection   . Pneumonia - 03/09, 12/09, 02/10, 06/10    MOST RECENT FEB 2013  . Elevated liver enzymes   . Blood transfusion   . Anginal pain   . Shortness of breath   . Fibromyalgia     COUPLE OF TIMES A YEAR-PT HAS EPISODES OF CONFUSION-USUALLY INVOLVES DAY/NIGHT REVERSAL AND EPISODES OF TWITCHING AND FALLS--SEES DR. Modesto Charon - NEUROLOGIST-LAST SEEN 07/10/11  . LBBB (left bundle branch block)   . Atrial fibrillation   . COPD (chronic obstructive pulmonary disease)     continuous O2- 2l  . Hepatitis     hx of in high school    Past Surgical History  Procedure Laterality Date  . Appendectomy    . Pacemaker placement  12/21/2009  . Peg removed      with complications  . Tonsillectomy  age 20  . Interstim implant revision  03/06/2011    Procedure: REVISION OF Leane Platt;  Surgeon: Martina Sinner, MD;  Location: [facility];  Service: Urology;  Laterality: N/A;  Replacement of Neurostimulator  . Portacath placement  12/16/2009  . Total abdominal hysterectomy      complete  . Interstim implant placement  05/28/2006 - stage I    06/05/2006 - stage II  . Interstim implant revision  10/23/2007  . Cystoscopy with injection  04/30/2006    transurethral collagen injection; incision vaginal stenosis  . Small intestine surgery  05/20/2001    ex. lap., resection of small bowel stricture; gastrostomy; insertion central line  . Cardiac catheterization  04/08/2006, 11/16/2009  . Cystoscopy  11/11/2008  . Cystoscopy w/ retrogrades  11/11/2008    right  . Colectomy      Intestinal resection (5times)  . Colectomy  02/04/2000    ex. lap., intra-abd. subtotal  colectomy with ileosigmoid colon anastomosies and lysis of adhesions  . Gastrocutaneous fistula closure    . Exploratory laparotomy  04/27/2009    lysis of adhesions, gastrostomy tube  . Port-a-cath removal  07/27/2011    Procedure: REMOVAL PORT-A-CATH;  Surgeon: Emelia Loron, MD;  Location: [facility];  Service: General;  Laterality: N/A;  . Total hip arthroplasty  08/03/2011    Procedure: TOTAL HIP ARTHROPLASTY ANTERIOR APPROACH;  Surgeon: Kathryne Hitch, MD;  Location: [facility];  Service: Orthopedics;  Laterality: Right;  . Interstim implant placement  02/05/2012    Procedure: INTERSTIM IMPLANT FIRST STAGE;  Surgeon: Martina Sinner, MD;  Location: [facility];  Service: Urology;  Laterality: N/A;  Replacement of Interstim Lead       reports that she quit smoking about 26 years ago. She has never used smokeless tobacco. She reports that she does not drink alcohol or use illicit drugs. family history includes Breast cancer in her maternal aunt; Diabetes in her paternal grandfather; Heart disease in her brother, father, mother, and paternal aunt; and Stroke in her mother.  There is no history of Colon cancer. Allergies  Allergen Reactions  . Dabigatran Etexilate Mesylate Other (See Comments)    INTERNAL BLEEDING-pradaxa  . Talwin (Pentazocine) Other (See Comments)    hallucinations  . Pentazocine Lactate Other (See Comments)    HALLUCINATIONS  . Sulfonamide Derivatives Other (See Comments)    JITTERINESS         Review of Systems:Positive for a diarrhea. Negative for abdominal pain  The remainder of the 10 point ROS is negative except as outlined in H&P   Physical Exam: General appearance  Well developed, in no distress.Chronically ill appearing  Eyes- non icteric. HEENT nontraumatic, normocephalic. Mouth no lesions, tongue papillated, no cheilosis. Neck supple without adenopathy, thyroid not enlarged, no carotid bruits, no JVD. Lungs Clear to  auscultation bilaterally. Cor normal S1, normal S2, regular rhythm, no murmur,  quiet precordium. Abdomen: Soft not distended with minimal tympany. Normoactive bowel sounds. Mild tenderness to the left of the umbilicus  Rectal:Not done  Extremities no pedal edema. Skin no lesions. Neurological alert and oriented x 3. Psychological normal mood and affect.  Assessment and Plan:  66 year old white female with chronic low-grade small bowel obstruction was doing very well at this point. She has gained 1 pound. Is trying to eat small frequent feedings. Her iron level is normal. We will have to recheck in 3 months. I will see her in 3 months. She will likely need iron infusions in the  future. For now she will continue probiotic and Imodium and Protonix   10/14/2012 [name]

## 2012-10-14 NOTE — Patient Instructions (Addendum)
Please follow up with Dr Juanda Chance in 3 months.  CC: Dr Loreen Freud

## 2012-10-20 ENCOUNTER — Other Ambulatory Visit: Payer: Self-pay | Admitting: Family Medicine

## 2012-10-21 NOTE — Telephone Encounter (Signed)
Last seen 01/29/12 and filled 07/23/12 #60 with 2 refills. please advise      KP

## 2012-10-24 ENCOUNTER — Telehealth: Payer: Self-pay | Admitting: Family Medicine

## 2012-10-24 DIAGNOSIS — R52 Pain, unspecified: Secondary | ICD-10-CM

## 2012-10-24 MED ORDER — CYCLOBENZAPRINE HCL 10 MG PO TABS: ORAL_TABLET | ORAL | Status: DC

## 2012-10-24 MED ORDER — FENTANYL 50 MCG/HR TD PT72: 1.0000 | MEDICATED_PATCH | TRANSDERMAL | Status: DC

## 2012-10-24 MED ORDER — OXYCODONE-ACETAMINOPHEN 10-325 MG PO TABS: 1.0000 | ORAL_TABLET | ORAL | Status: DC | PRN

## 2012-10-24 NOTE — Telephone Encounter (Signed)
Last seen 01/29/12 and filled 09/16/12. Oxy 120 and fent #10. Contract signed12/24/13 and UDS not done. NO pending appointments.  Please advise      KP

## 2012-10-24 NOTE — Telephone Encounter (Signed)
Ok to fill x1 

## 2012-10-24 NOTE — Telephone Encounter (Signed)
Patient aware Rx ready will be ready for pickup after 2 pm

## 2012-10-24 NOTE — Telephone Encounter (Signed)
Patient is calling back to check the status of this request.  °

## 2012-10-24 NOTE — Telephone Encounter (Signed)
Patient needs a new Oxycodone and Fentanyl Rx for this month. Please advise.

## 2012-10-28 ENCOUNTER — Encounter: Payer: Medicare Other | Admitting: Internal Medicine

## 2012-11-19 ENCOUNTER — Ambulatory Visit (INDEPENDENT_AMBULATORY_CARE_PROVIDER_SITE_OTHER): Payer: Medicare Other

## 2012-11-19 DIAGNOSIS — E538 Deficiency of other specified B group vitamins: Secondary | ICD-10-CM

## 2012-11-19 MED ORDER — CYANOCOBALAMIN 1000 MCG/ML IJ SOLN
1000.0000 ug | Freq: Once | INTRAMUSCULAR | Status: AC
  Administered 2012-11-19: 1000 ug via INTRAMUSCULAR

## 2012-12-02 ENCOUNTER — Other Ambulatory Visit: Payer: Self-pay | Admitting: Family Medicine

## 2012-12-02 DIAGNOSIS — R52 Pain, unspecified: Secondary | ICD-10-CM

## 2012-12-02 MED ORDER — FENTANYL 50 MCG/HR TD PT72: 1.0000 | MEDICATED_PATCH | TRANSDERMAL | Status: DC

## 2012-12-02 MED ORDER — OXYCODONE-ACETAMINOPHEN 10-325 MG PO TABS: 1.0000 | ORAL_TABLET | ORAL | Status: DC | PRN

## 2012-12-02 NOTE — Telephone Encounter (Signed)
Last seen 01/29/12 and filled 10/24/12 #120 for Oxy and 10 for Fent. Please advise     KP

## 2012-12-02 NOTE — Telephone Encounter (Signed)
Patient aware Rx ready for pick up.      KP 

## 2012-12-02 NOTE — Telephone Encounter (Signed)
Patient needs a new Oxycodone and Fentanyl Rx for this month. Please advise. °

## 2012-12-04 ENCOUNTER — Telehealth: Payer: Self-pay | Admitting: *Deleted

## 2012-12-04 NOTE — Telephone Encounter (Signed)
Message copied by Daphine Deutscher on Thu Dec 04, 2012  8:13 AM ------      Message from: Daphine Deutscher      Created: Thu Oct 09, 2012  8:28 AM       Call and remind due for CBC on 12/08/12 for DB ------

## 2012-12-04 NOTE — Telephone Encounter (Signed)
Patient notified of labs due next week.

## 2012-12-07 ENCOUNTER — Other Ambulatory Visit: Payer: Self-pay | Admitting: Cardiology

## 2012-12-08 ENCOUNTER — Other Ambulatory Visit (INDEPENDENT_AMBULATORY_CARE_PROVIDER_SITE_OTHER): Payer: Medicare Other

## 2012-12-08 DIAGNOSIS — D509 Iron deficiency anemia, unspecified: Secondary | ICD-10-CM

## 2012-12-08 LAB — CBC WITH DIFFERENTIAL/PLATELET
Basophils Absolute: 0 10*3/uL (ref 0.0–0.1)
Basophils Relative: 0.5 % (ref 0.0–3.0)
Eosinophils Absolute: 0.2 10*3/uL (ref 0.0–0.7)
Eosinophils Relative: 3.8 % (ref 0.0–5.0)
HCT: 35.4 % — ABNORMAL LOW (ref 36.0–46.0)
Hemoglobin: 11.8 g/dL — ABNORMAL LOW (ref 12.0–15.0)
Lymphocytes Relative: 14.2 % (ref 12.0–46.0)
Lymphs Abs: 0.9 10*3/uL (ref 0.7–4.0)
MCHC: 33.3 g/dL (ref 30.0–36.0)
MCV: 86.5 fl (ref 78.0–100.0)
Monocytes Absolute: 0.6 10*3/uL (ref 0.1–1.0)
Monocytes Relative: 10.4 % (ref 3.0–12.0)
Neutro Abs: 4.3 10*3/uL (ref 1.4–7.7)
Neutrophils Relative %: 71.1 % (ref 43.0–77.0)
Platelets: 229 10*3/uL (ref 150.0–400.0)
RBC: 4.09 Mil/uL (ref 3.87–5.11)
RDW: 14.2 % (ref 11.5–14.6)
WBC: 6 10*3/uL (ref 4.5–10.5)

## 2012-12-09 ENCOUNTER — Ambulatory Visit (INDEPENDENT_AMBULATORY_CARE_PROVIDER_SITE_OTHER): Payer: Medicare Other | Admitting: *Deleted

## 2012-12-09 ENCOUNTER — Other Ambulatory Visit: Payer: Self-pay | Admitting: *Deleted

## 2012-12-09 DIAGNOSIS — Z23 Encounter for immunization: Secondary | ICD-10-CM

## 2012-12-09 DIAGNOSIS — D649 Anemia, unspecified: Secondary | ICD-10-CM

## 2012-12-09 DIAGNOSIS — E538 Deficiency of other specified B group vitamins: Secondary | ICD-10-CM

## 2012-12-09 MED ORDER — CYANOCOBALAMIN 1000 MCG/ML IJ SOLN
1000.0000 ug | Freq: Once | INTRAMUSCULAR | Status: AC
  Administered 2012-12-09: 1000 ug via INTRAMUSCULAR

## 2012-12-12 ENCOUNTER — Other Ambulatory Visit: Payer: Self-pay | Admitting: Family Medicine

## 2012-12-12 NOTE — Telephone Encounter (Signed)
Med filled. Letter mailed to have pt schedule appt.

## 2012-12-14 ENCOUNTER — Other Ambulatory Visit: Payer: Self-pay | Admitting: Family Medicine

## 2012-12-19 ENCOUNTER — Encounter: Payer: Self-pay | Admitting: Internal Medicine

## 2012-12-19 ENCOUNTER — Ambulatory Visit (INDEPENDENT_AMBULATORY_CARE_PROVIDER_SITE_OTHER): Payer: Medicare Other | Admitting: Internal Medicine

## 2012-12-19 VITALS — BP 120/72 | HR 83 | Ht 60.0 in | Wt 105.0 lb

## 2012-12-19 DIAGNOSIS — I4891 Unspecified atrial fibrillation: Secondary | ICD-10-CM

## 2012-12-19 DIAGNOSIS — Z950 Presence of cardiac pacemaker: Secondary | ICD-10-CM

## 2012-12-19 DIAGNOSIS — I4892 Unspecified atrial flutter: Secondary | ICD-10-CM

## 2012-12-19 DIAGNOSIS — I5022 Chronic systolic (congestive) heart failure: Secondary | ICD-10-CM

## 2012-12-19 DIAGNOSIS — I428 Other cardiomyopathies: Secondary | ICD-10-CM

## 2012-12-19 LAB — PACEMAKER DEVICE OBSERVATION
AL AMPLITUDE: 1.25 mv
AL IMPEDENCE PM: 380 Ohm
AL THRESHOLD: 0.5 V
ATRIAL PACING PM: 0.01
BAMS-0001: 170 {beats}/min
BATTERY VOLTAGE: 2.9973 V
LV LEAD THRESHOLD: 0.75 V
RV LEAD AMPLITUDE: 7.25 mv
RV LEAD IMPEDENCE PM: 494 Ohm
RV LEAD THRESHOLD: 0.75 V
VENTRICULAR PACING PM: 99.95

## 2012-12-19 NOTE — Assessment & Plan Note (Signed)
The patient's device was interrogated.  The information was reviewed. No changes were made in the programming.    

## 2012-12-19 NOTE — Assessment & Plan Note (Addendum)
No intercurrent atrial arrhythmias. She is not on Coumadin but is on aspirin

## 2012-12-19 NOTE — Patient Instructions (Addendum)
Your physician recommends that you schedule a follow-up appointment in: 6 months with device clinic  Your physician wants you to follow-up in: one year with Dr. Graciela Husbands.  You will receive a reminder letter in the mail two months in advance. If you don't receive a letter, please call our office to schedule the follow-up appointment.

## 2012-12-19 NOTE — Assessment & Plan Note (Signed)
Significant interval improvement; she is not on ACE inhibitors

## 2012-12-19 NOTE — Progress Notes (Signed)
Patient Care Team: Lelon Perla, DO as PCP - General Emelia Loron, MD as Consulting Physician (General Surgery) Hart Carwin, MD as Consulting Physician (Gastroenterology)   HPI  [name] is a 67 y.o. female seen in followup for congestive heart failure in the setting of nonischemic cardiomyopathy. She underwent CRT-P implantation October 2011. This has been associated with significant improvement although complicated by left upper extremity deep venous thrombosis.   She also has a history of atrial arrhythmias for which she was treated with amiodarone. LFTs normal feb 2014 ; Also O2 dependent COPD, chronic pain and malnutrition    Last echocardiogram demonstrated ejection fraction of 45-50% 1/12   Past Medical History  Diagnosis Date  . GERD (gastroesophageal reflux disease)   . CAD (coronary artery disease)     mild disease per cath in 2011  . Venous embolism and thrombosis of subclavian vein     after pacemaker insertion Oct 2011  . Pleural effusion      s/p right thoracentesis 03/09  . Small bowel obstruction   . Urinary incontinence      -INTERSTIM IMPLANT NOT FUNCTIONING PER PT  . RLS (restless legs syndrome)   . Primary dilated cardiomyopathy     EF 45 to 50% per echo in Jan 2012  . Vitamin B12 deficiency   . Gastroparesis   . Chronic nausea   . Chronic abdominal pain   . Chronic pain syndrome   . Pacemaker     CRT therapy; followed by Dr. Graciela Husbands  . H/O: GI bleed     from Pradaxa  . Oxygen dependent     2 liters via nasal cannula at all times  . CHF (congestive heart failure)   . Anemia   . Hx of stress fx aug 2011    right hip   . Elbow fracture, left aug 2012  . Arthritis     right hip  . Hx of gastric ulcer   . Diverticulitis   . HTN (hypertension)     under control; has been on med. x "years"  . Hx MRSA infection   . Pneumonia - 03/09, 12/09, 02/10, 06/10    MOST RECENT FEB 2013  . Elevated liver enzymes   . Blood transfusion   .  Anginal pain   . Shortness of breath   . Fibromyalgia     COUPLE OF TIMES A YEAR-PT HAS EPISODES OF CONFUSION-USUALLY INVOLVES DAY/NIGHT REVERSAL AND EPISODES OF TWITCHING AND FALLS--SEES DR. Modesto Charon - NEUROLOGIST-LAST SEEN 07/10/11  . LBBB (left bundle branch block)   . Atrial fibrillation   . COPD (chronic obstructive pulmonary disease)     continuous O2- 2l  . Hepatitis     hx of in high school     Past Surgical History  Procedure Laterality Date  . Appendectomy    . Pacemaker placement  12/21/2009  . Peg removed      with complications  . Tonsillectomy  age 44  . Interstim implant revision  03/06/2011    Procedure: REVISION OF Leane Platt;  Surgeon: Martina Sinner, MD;  Location: [facility];  Service: Urology;  Laterality: N/A;  Replacement of Neurostimulator  . Portacath placement  12/16/2009  . Total abdominal hysterectomy      complete  . Interstim implant placement  05/28/2006 - stage I    06/05/2006 - stage II  . Interstim implant revision  10/23/2007  . Cystoscopy with injection  04/30/2006    transurethral collagen injection; incision  vaginal stenosis  . Small intestine surgery  05/20/2001    ex. lap., resection of small bowel stricture; gastrostomy; insertion central line  . Cardiac catheterization  04/08/2006, 11/16/2009  . Cystoscopy  11/11/2008  . Cystoscopy w/ retrogrades  11/11/2008    right  . Colectomy      Intestinal resection (5times)  . Colectomy  02/04/2000    ex. lap., intra-abd. subtotal colectomy with ileosigmoid colon anastomosies and lysis of adhesions  . Gastrocutaneous fistula closure    . Exploratory laparotomy  04/27/2009    lysis of adhesions, gastrostomy tube  . Port-a-cath removal  07/27/2011    Procedure: REMOVAL PORT-A-CATH;  Surgeon: Emelia Loron, MD;  Location: [facility];  Service: General;  Laterality: N/A;  . Total hip arthroplasty  08/03/2011    Procedure: TOTAL HIP ARTHROPLASTY ANTERIOR APPROACH;  Surgeon: Kathryne Hitch,  MD;  Location: [facility];  Service: Orthopedics;  Laterality: Right;  . Interstim implant placement  02/05/2012    Procedure: INTERSTIM IMPLANT FIRST STAGE;  Surgeon: Martina Sinner, MD;  Location: [facility];  Service: Urology;  Laterality: N/A;  Replacement of Interstim Lead       Current Outpatient Prescriptions  Medication Sig Dispense Refill  . azelastine (OPTIVAR) 0.05 % ophthalmic solution INSTILL 1 DROP INTO BOTH EYES TWICE DAILY  6 mL  11  . calcium-vitamin D (OSCAL WITH D) 500-200 MG-UNIT per tablet Take 1 tablet by mouth 2 (two) times daily.  60 tablet  0  . carvedilol (COREG) 6.25 MG tablet TAKE ONE TABLET BY MOUTH TWICE DAILY WITH  MEALS  60 tablet  5  . cyanocobalamin (,VITAMIN B-12,) 1000 MCG/ML injection Inject 1 mL (1,000 mcg total) into the muscle once. Labs are due now  4 mL  0  . cyclobenzaprine (FLEXERIL) 10 MG tablet TAKE 1 TABLET BY MOUTH THREE TIMES DAILY AS NEEDED FOR MUSCLE SPASMS  60 tablet  2  . denosumab (PROLIA) 60 MG/ML SOLN Inject 60 mg into the skin every 6 (six) months. Twice a year      . fentaNYL (DURAGESIC - DOSED MCG/HR) 50 MCG/HR Place 1 patch (50 mcg total) onto the skin every 3 (three) days.  10 patch  0  . furosemide (LASIX) 20 MG tablet       . gabapentin (NEURONTIN) 600 MG tablet TAKE ONE TABLET BY MOUTH 4 TIMES DAILY  120 tablet  0  . loperamide (IMODIUM A-D) 2 MG tablet Take 1/2 tablet by mouth daily as needed for diarrhea.  30 tablet  2  . nitroGLYCERIN (NITROSTAT) 0.4 MG SL tablet Place 0.4 mg under the tongue every 5 (five) minutes as needed. CHEST PAIN      . oxyCODONE-acetaminophen (PERCOCET) 10-325 MG per tablet Take 1 tablet by mouth every 4 (four) hours as needed. PAIN  120 tablet  0  . pantoprazole (PROTONIX) 40 MG tablet TAKE 1 TABLET BY MOUTH DAILY  30 tablet  5  . promethazine (PHENERGAN) 25 MG tablet Take 1 tablet (25 mg total) by mouth every 8 (eight) hours as needed for nausea.  40 tablet  0  . temazepam (RESTORIL) 15 MG capsule        . temazepam (RESTORIL) 15 MG capsule TAKE 1 TO 2 CAPSULES BY MOUTH EVERY NIGHT AT BEDTIME AS NEEDED FOR SLEEP  60 capsule  5  . tiotropium (SPIRIVA HANDIHALER) 18 MCG inhalation capsule Place 1 capsule (18 mcg total) into inhaler and inhale every morning.  30 capsule  5  .  albuterol (PROAIR HFA) 108 (90 BASE) MCG/ACT inhaler Inhale 2 puffs into the lungs every 6 (six) hours as needed for wheezing.  1 Inhaler  5  . [DISCONTINUED] iron dextran complex (INFED) 50 MG/ML injection Please give infed infusion (no test dose needed) over 4 hours per pharmacy calculated dose. Ht: 5'2 Wt: 99 pounds Hgb: 12  1 mL  0   No current facility-administered medications for this visit.    Allergies  Allergen Reactions  . Dabigatran Etexilate Mesylate Other (See Comments)    INTERNAL BLEEDING-pradaxa  . Talwin [Pentazocine] Other (See Comments)    hallucinations  . Pentazocine Lactate Other (See Comments)    HALLUCINATIONS  . Sulfonamide Derivatives Other (See Comments)    JITTERINESS     Review of Systems negative except from HPI and PMH  Physical Exam BP 120/72  Pulse 83  Ht 5' (1.524 m)  Wt 105 lb (47.628 kg)  BMI 20.51 kg/m2 Well developed and nourished in no acute distress HENT normal Neck supple with JVP-flat Clear Device pocket well healed; without hematoma or erythema.  There is no tethering cevice with migration into arm pit Regular rate and rhythm, no murmurs or gallops Abd-soft with active BS No Clubbing cyanosis edema Skin-warm and dry A & Oriented  Grossly normal sensory and motor function     Assessment and  Plan

## 2012-12-19 NOTE — Assessment & Plan Note (Signed)
Euvolemic. We will continue her on her current medications

## 2012-12-20 ENCOUNTER — Other Ambulatory Visit: Payer: Self-pay | Admitting: Cardiology

## 2013-01-06 ENCOUNTER — Telehealth: Payer: Self-pay | Admitting: Family Medicine

## 2013-01-06 DIAGNOSIS — R52 Pain, unspecified: Secondary | ICD-10-CM

## 2013-01-06 MED ORDER — OXYCODONE-ACETAMINOPHEN 10-325 MG PO TABS: 1.0000 | ORAL_TABLET | ORAL | Status: DC | PRN

## 2013-01-06 MED ORDER — FENTANYL 50 MCG/HR TD PT72: 1.0000 | MEDICATED_PATCH | TRANSDERMAL | Status: DC

## 2013-01-06 NOTE — Telephone Encounter (Signed)
Correction---No answer and No message left.      KP

## 2013-01-06 NOTE — Telephone Encounter (Signed)
Patient is requesting a new prescription for oxycodone and pain patch

## 2013-01-06 NOTE — Telephone Encounter (Signed)
Ok to refill x 1  

## 2013-01-06 NOTE — Telephone Encounter (Signed)
Patient aware Rx ready for pick up.      KP 

## 2013-01-06 NOTE — Telephone Encounter (Signed)
Scheduled patient for 01/09/2013 at 1:45pm

## 2013-01-06 NOTE — Telephone Encounter (Signed)
Please advise      KP 

## 2013-01-06 NOTE — Telephone Encounter (Signed)
No refills. Patient needs an apt. Please offer this patient an apt per Dr.Lowne.      KP

## 2013-01-09 ENCOUNTER — Ambulatory Visit: Payer: Medicare Other

## 2013-01-09 ENCOUNTER — Ambulatory Visit (INDEPENDENT_AMBULATORY_CARE_PROVIDER_SITE_OTHER): Payer: Medicare Other | Admitting: Family Medicine

## 2013-01-09 ENCOUNTER — Encounter: Payer: Self-pay | Admitting: Family Medicine

## 2013-01-09 VITALS — BP 118/78 | HR 106 | Temp 98.2°F | Wt 104.6 lb

## 2013-01-09 DIAGNOSIS — E440 Moderate protein-calorie malnutrition: Secondary | ICD-10-CM

## 2013-01-09 DIAGNOSIS — J9611 Chronic respiratory failure with hypoxia: Secondary | ICD-10-CM

## 2013-01-09 DIAGNOSIS — G894 Chronic pain syndrome: Secondary | ICD-10-CM

## 2013-01-09 DIAGNOSIS — K56609 Unspecified intestinal obstruction, unspecified as to partial versus complete obstruction: Secondary | ICD-10-CM

## 2013-01-09 DIAGNOSIS — E538 Deficiency of other specified B group vitamins: Secondary | ICD-10-CM

## 2013-01-09 DIAGNOSIS — R300 Dysuria: Secondary | ICD-10-CM

## 2013-01-09 DIAGNOSIS — I10 Essential (primary) hypertension: Secondary | ICD-10-CM

## 2013-01-09 DIAGNOSIS — J9610 Chronic respiratory failure, unspecified whether with hypoxia or hypercapnia: Secondary | ICD-10-CM

## 2013-01-09 LAB — POCT URINALYSIS DIPSTICK
Blood, UA: NEGATIVE
Glucose, UA: NEGATIVE
Ketones, UA: NEGATIVE
Nitrite, UA: NEGATIVE
Spec Grav, UA: 1.01
Urobilinogen, UA: 0.2
pH, UA: 5

## 2013-01-09 MED ORDER — CYANOCOBALAMIN 1000 MCG/ML IJ SOLN
1000.0000 ug | Freq: Once | INTRAMUSCULAR | Status: AC
  Administered 2013-01-09: 1000 ug via INTRAMUSCULAR

## 2013-01-09 MED ORDER — CIPROFLOXACIN HCL 500 MG PO TABS: 500.0000 mg | ORAL_TABLET | Freq: Two times a day (BID) | ORAL | Status: DC

## 2013-01-09 NOTE — Patient Instructions (Addendum)
F/u 6 months for pain meds and bp Pt is due Nov 9 for prolia inject

## 2013-01-10 LAB — BASIC METABOLIC PANEL
BUN: 27 mg/dL — ABNORMAL HIGH (ref 6–23)
CO2: 30 mEq/L (ref 19–32)
Calcium: 8.6 mg/dL (ref 8.4–10.5)
Chloride: 99 mEq/L (ref 96–112)
Creat: 0.74 mg/dL (ref 0.50–1.10)
Glucose, Bld: 70 mg/dL (ref 70–99)
Potassium: 3.9 mEq/L (ref 3.5–5.3)
Sodium: 140 mEq/L (ref 135–145)

## 2013-01-10 LAB — VITAMIN B12: Vitamin B-12: 594 pg/mL (ref 211–911)

## 2013-01-10 NOTE — Assessment & Plan Note (Signed)
Stable con't meds 

## 2013-01-10 NOTE — Progress Notes (Signed)
  Subjective:    [name] is a 67 y.o. female who complains of dysuria and frequency. She has had symptoms for a few days. Patient also complains of back pain, congestion, cough, fever, headache, rhinitis, sorethroat, stomach ache and vaginal discharge. Patient denies congestion, cough, fever, headache, rhinitis, sorethroat and vaginal discharge. Patient does not have a history of recurrent UTI. Patient does not have a history of pyelonephritis.    She is also here for f/u chronic pain and htn.  No new complaints.  The following portions of the patient's history were reviewed and updated as appropriate: allergies, current medications, past family history, past medical history, past social history, past surgical history and problem list.  Review of Systems Pertinent items are noted in HPI.    Objective:    BP 118/78  Pulse 106  Temp(Src) 98.2 F (36.8 C) (Oral)  Wt 104 lb 9.6 oz (47.446 kg)  BMI 20.43 kg/m2  SpO2 96% General appearance: alert, cooperative, appears stated age and no distress Lungs: clear to auscultation bilaterally Heart: S1, S2 normal Extremities: extremities normal, atraumatic, no cyanosis or edema  Laboratory:  Urine dipstick: trace for leukocyte esterase, trace for protein and moderate for urobilinogen.   Micro exam: not done.    Assessment:    dysuria     Plan:    Medications: rx cipro given but pt was going to wait for culture. Maintain adequate hydration. Follow up if symptoms not improving, and as needed.

## 2013-01-10 NOTE — Assessment & Plan Note (Signed)
con't meds 

## 2013-01-10 NOTE — Assessment & Plan Note (Signed)
Culture pending

## 2013-01-10 NOTE — Assessment & Plan Note (Signed)
Check labs today con't injections

## 2013-01-10 NOTE — Assessment & Plan Note (Signed)
Per GI Pt states she had pain over last few days but it went away

## 2013-01-10 NOTE — Assessment & Plan Note (Signed)
On O2 

## 2013-01-10 NOTE — Assessment & Plan Note (Addendum)
Pt encouraged to call GI about recent abd pains She has not been eating , only drinking--

## 2013-01-12 LAB — URINE CULTURE: Colony Count: >=100000

## 2013-01-13 ENCOUNTER — Telehealth: Payer: Self-pay | Admitting: Family Medicine

## 2013-01-13 ENCOUNTER — Encounter: Payer: Self-pay | Admitting: Internal Medicine

## 2013-01-13 ENCOUNTER — Ambulatory Visit (INDEPENDENT_AMBULATORY_CARE_PROVIDER_SITE_OTHER): Payer: Medicare Other | Admitting: Internal Medicine

## 2013-01-13 VITALS — BP 110/68 | HR 68 | Ht 60.0 in | Wt 102.6 lb

## 2013-01-13 DIAGNOSIS — R633 Feeding difficulties, unspecified: Secondary | ICD-10-CM

## 2013-01-13 DIAGNOSIS — K5090 Crohn's disease, unspecified, without complications: Secondary | ICD-10-CM

## 2013-01-13 DIAGNOSIS — R1310 Dysphagia, unspecified: Secondary | ICD-10-CM

## 2013-01-13 DIAGNOSIS — K56609 Unspecified intestinal obstruction, unspecified as to partial versus complete obstruction: Secondary | ICD-10-CM

## 2013-01-13 MED ORDER — PROMETHAZINE HCL 25 MG PO TABS: 25.0000 mg | ORAL_TABLET | Freq: Three times a day (TID) | ORAL | Status: DC | PRN

## 2013-01-13 NOTE — Progress Notes (Signed)
[name] 10-Apr-1945 MRN [id number]   History of Present Illness:  This is a 67 year old white female with a complicated medical history. She has nonischemic cardiomyopathy Dr Antoine Poche, Dr Ladona Ridgel) and oxygen-dependent COPD followed by Dr Craige Cotta. She has chronic abdominal  pain and malnutrition. She also has a history of a GI bleed from Pradaxa ( 2011). Her last appointment was on 10/14/2012. She had an episode of a small bowel obstruction last week which lasted 2 days and resolved spontaneously  on bowel rest. She did not have to go to  emergency room. She has severe chronic adhesive disease causing partial small bowel obstruction. She has had several abdominal surgeries including a subtotal colectomy and ileocolic anastomosis which is located at 20 cm from the rectum. She had unsuccessful lysis of adhesions in March 2011 and she has a stricture in the distal ileum which also causes ( flex sig 2012), low-grade GI bleeding. She has had iron infusions in the past; last one in February 2014. Her last hemoglobin on 12/08/2012 was 11.8. She has chronic diarrhea and known gastroparesis. She had a gastrocutaneous fistula following PEG removal in 2011. She is on fentanyl patches and Phenergan when necessary. Her weight has been stable at 102 pounds. Today, patient complains of dysphagia to solid foods which has slowly worsened.    Past Medical History  Diagnosis Date  . GERD (gastroesophageal reflux disease)   . CAD (coronary artery disease)     mild disease per cath in 2011  . Venous embolism and thrombosis of subclavian vein     after pacemaker insertion Oct 2011  . Pleural effusion      s/p right thoracentesis 03/09  . Small bowel obstruction   . Urinary incontinence      -INTERSTIM IMPLANT NOT FUNCTIONING PER PT  . RLS (restless legs syndrome)   . Primary dilated cardiomyopathy     EF 45 to 50% per echo in Jan 2012  . Vitamin B12 deficiency   . Gastroparesis   . Chronic nausea   . Chronic  abdominal pain   . Chronic pain syndrome   . Pacemaker     CRT therapy; followed by Dr. Graciela Husbands  . H/O: GI bleed     from Pradaxa  . Oxygen dependent     2 liters via nasal cannula at all times  . CHF (congestive heart failure)   . Anemia   . Hx of stress fx aug 2011    right hip   . Elbow fracture, left aug 2012  . Arthritis     right hip  . Hx of gastric ulcer   . Diverticulitis   . HTN (hypertension)     under control; has been on med. x "years"  . Hx MRSA infection   . Pneumonia - 03/09, 12/09, 02/10, 06/10    MOST RECENT FEB 2013  . Elevated liver enzymes   . Blood transfusion   . Anginal pain   . Shortness of breath   . Fibromyalgia     COUPLE OF TIMES A YEAR-PT HAS EPISODES OF CONFUSION-USUALLY INVOLVES DAY/NIGHT REVERSAL AND EPISODES OF TWITCHING AND FALLS--SEES DR. Modesto Charon - NEUROLOGIST-LAST SEEN 07/10/11  . LBBB (left bundle branch block)   . Atrial fibrillation   . COPD (chronic obstructive pulmonary disease)     continuous O2- 2l  . Hepatitis     hx of in high school    Past Surgical History  Procedure Laterality Date  . Appendectomy    .  Pacemaker placement  12/21/2009  . Peg removed      with complications  . Tonsillectomy  age 8  . Interstim implant revision  03/06/2011    Procedure: REVISION OF Leane Platt;  Surgeon: Martina Sinner, MD;  Location: [facility];  Service: Urology;  Laterality: N/A;  Replacement of Neurostimulator  . Portacath placement  12/16/2009  . Total abdominal hysterectomy      complete  . Interstim implant placement  05/28/2006 - stage I    06/05/2006 - stage II  . Interstim implant revision  10/23/2007  . Cystoscopy with injection  04/30/2006    transurethral collagen injection; incision vaginal stenosis  . Small intestine surgery  05/20/2001    ex. lap., resection of small bowel stricture; gastrostomy; insertion central line  . Cardiac catheterization  04/08/2006, 11/16/2009  . Cystoscopy  11/11/2008  . Cystoscopy w/ retrogrades  11/11/2008     right  . Colectomy      Intestinal resection (5times)  . Colectomy  02/04/2000    ex. lap., intra-abd. subtotal colectomy with ileosigmoid colon anastomosies and lysis of adhesions  . Gastrocutaneous fistula closure    . Exploratory laparotomy  04/27/2009    lysis of adhesions, gastrostomy tube  . Port-a-cath removal  07/27/2011    Procedure: REMOVAL PORT-A-CATH;  Surgeon: Emelia Loron, MD;  Location: [facility];  Service: General;  Laterality: N/A;  . Total hip arthroplasty  08/03/2011    Procedure: TOTAL HIP ARTHROPLASTY ANTERIOR APPROACH;  Surgeon: Kathryne Hitch, MD;  Location: [facility];  Service: Orthopedics;  Laterality: Right;  . Interstim implant placement  02/05/2012    Procedure: INTERSTIM IMPLANT FIRST STAGE;  Surgeon: Martina Sinner, MD;  Location: [facility];  Service: Urology;  Laterality: N/A;  Replacement of Interstim Lead       reports that she quit smoking about 26 years ago. She has never used smokeless tobacco. She reports that she does not drink alcohol or use illicit drugs. family history includes Breast cancer in her maternal aunt; Diabetes in her paternal grandfather; Heart disease in her brother, father, mother, and paternal aunt; Stroke in her mother. There is no history of Colon cancer. Allergies  Allergen Reactions  . Dabigatran Etexilate Mesylate Other (See Comments)    INTERNAL BLEEDING-pradaxa  . Talwin [Pentazocine] Other (See Comments)    hallucinations  . Pentazocine Lactate Other (See Comments)    HALLUCINATIONS  . Sulfonamide Derivatives Other (See Comments)    JITTERINESS         Review of Systems: Negative for dysphagia. Weight stable. No rectal bleeding  The remainder of the 10 point ROS is negative except as outlined in H&P   Physical Exam: General appearance  thin chronically-appearing in no distress. With portable oxygen Eyes- non icteric. HEENT nontraumatic, normocephalic. Mouth no lesions, tongue  papillated, no cheilosis. Neck supple without adenopathy, thyroid not enlarged, no carotid bruits, no JVD. Lungs Clear to auscultation bilaterally. Cor normal S1, normal S2, regular rhythm, no murmur,  quiet precordium. Implantable pacemaker in the left chest. Abdomen: Multiple scars. Quiet bowel sounds. No tenderness. Increased tympany. Rectal: Not done. Extremities no pedal edema. Skin no lesions. Neurological alert and oriented x 3. Psychological normal mood and affect.  Assessment and Plan:  Problem #92 67 year old white female with chronic low-grade small bowel obstruction. Patient is status post recent episode of small bowel obstruction which resolved on bowel rest. She is back on a low residue diet. She needs a refill on Phenergan. I  will see her in 3 months. She will have a CBC around December 5th.   Problem #2 Dysphagia. Patient complains of slow progression of dysphagia with solid foods. Due to her increased risk for complications under sedation, we will first complete a barium esophagram to assess for esophageal stricture.Continue PPI     01/13/2013 [name]

## 2013-01-13 NOTE — Telephone Encounter (Signed)
If patient happens to call she is due for her prolia injection on 01/25/13. Needs nurse visit. Attempted to call patient. Number listed has been disconnected.

## 2013-01-13 NOTE — Patient Instructions (Addendum)
We have sent the following medications to your pharmacy for you to pick up at your convenience: Phenergan  Your physician has requested that you go to the basement for the following lab work on 02/20/13: CBC  You have been scheduled for a Barium Esophogram at [facility] (1st floor of the hospital) on Friday 01/16/13 at 11:00 am. Please arrive 15 minutes prior to your appointment for registration. Make certain not to have anything to eat or drink 6 hours prior to your test. If you need to reschedule for any reason, please contact radiology at [phone] to do so. __________________________________________________________________ A barium swallow is an examination that concentrates on views of the esophagus. This tends to be a double contrast exam (barium and two liquids which, when combined, create a gas to distend the wall of the oesophagus) or single contrast (non-ionic iodine based). The study is usually tailored to your symptoms so a good history is essential. Attention is paid during the study to the form, structure and configuration of the esophagus, looking for functional disorders (such as aspiration, dysphagia, achalasia, motility and reflux) EXAMINATION You may be asked to change into a gown, depending on the type of swallow being performed. A radiologist and radiographer will perform the procedure. The radiologist will advise you of the type of contrast selected for your procedure and direct you during the exam. You will be asked to stand, sit or lie in several different positions and to hold a small amount of fluid in your mouth before being asked to swallow while the imaging is performed .In some instances you may be asked to swallow barium coated marshmallows to assess the motility of a solid food bolus. The exam can be recorded as a digital or video fluoroscopy procedure. POST PROCEDURE It will take 1-2 days for the barium to pass through your system. To facilitate this, it is  important, unless otherwise directed, to increase your fluids for the next 24-48hrs and to resume your normal diet.  This test typically takes about 30 minutes to perform. __________________________________________________________________________________   Please follow up with Dr Juanda Chance in 3 months.  CC: Dr Loreen Freud

## 2013-01-16 ENCOUNTER — Other Ambulatory Visit: Payer: Self-pay | Admitting: *Deleted

## 2013-01-16 ENCOUNTER — Other Ambulatory Visit (HOSPITAL_COMMUNITY): Payer: Medicare Other

## 2013-01-16 ENCOUNTER — Ambulatory Visit (HOSPITAL_COMMUNITY)
Admission: RE | Admit: 2013-01-16 | Discharge: 2013-01-16 | Disposition: A | Discharge status: Home / Self Care | Payer: Medicare Other | Source: Ambulatory Visit | Attending: Internal Medicine | Admitting: Internal Medicine

## 2013-01-16 DIAGNOSIS — R1319 Other dysphagia: Secondary | ICD-10-CM

## 2013-01-16 DIAGNOSIS — K5090 Crohn's disease, unspecified, without complications: Secondary | ICD-10-CM

## 2013-01-16 DIAGNOSIS — R1310 Dysphagia, unspecified: Secondary | ICD-10-CM

## 2013-01-16 DIAGNOSIS — K56609 Unspecified intestinal obstruction, unspecified as to partial versus complete obstruction: Secondary | ICD-10-CM

## 2013-01-16 DIAGNOSIS — IMO0002 Reserved for concepts with insufficient information to code with codable children: Secondary | ICD-10-CM | POA: Insufficient documentation

## 2013-01-16 DIAGNOSIS — T17408A Unspecified foreign body in trachea causing other injury, initial encounter: Secondary | ICD-10-CM | POA: Insufficient documentation

## 2013-01-19 ENCOUNTER — Other Ambulatory Visit: Payer: Self-pay | Admitting: Internal Medicine

## 2013-01-19 ENCOUNTER — Other Ambulatory Visit: Payer: Self-pay | Admitting: *Deleted

## 2013-01-19 DIAGNOSIS — R1319 Other dysphagia: Secondary | ICD-10-CM

## 2013-01-20 ENCOUNTER — Other Ambulatory Visit: Payer: Self-pay | Admitting: Family Medicine

## 2013-01-20 ENCOUNTER — Encounter: Payer: Self-pay | Admitting: Internal Medicine

## 2013-01-21 NOTE — Telephone Encounter (Signed)
Last seen 01/09/13 and filled 10/24/12 #60 with 2 refills. Please advise      KP

## 2013-01-22 ENCOUNTER — Other Ambulatory Visit: Payer: Self-pay | Admitting: Family Medicine

## 2013-01-22 ENCOUNTER — Other Ambulatory Visit: Payer: Self-pay | Admitting: Cardiology

## 2013-01-22 ENCOUNTER — Ambulatory Visit (AMBULATORY_SURGERY_CENTER): Payer: Self-pay | Admitting: *Deleted

## 2013-01-22 VITALS — Ht 60.0 in | Wt 107.4 lb

## 2013-01-22 DIAGNOSIS — R1310 Dysphagia, unspecified: Secondary | ICD-10-CM

## 2013-01-22 NOTE — Progress Notes (Signed)
No allergies to eggs or soy. No problems with anesthesia.  

## 2013-01-23 ENCOUNTER — Encounter: Payer: Self-pay | Admitting: Internal Medicine

## 2013-02-01 NOTE — H&P (View-Only) (Signed)
Emani H Farone 06/09/1945 MRN [id number]   History of Present Illness:  This is a 67-year-old white female with a complicated medical history. She has nonischemic cardiomyopathy Dr Hochrein, Dr Taylor) and oxygen-dependent COPD followed by Dr Sood. She has chronic abdominal  pain and malnutrition. She also has a history of a GI bleed from Pradaxa ( 2011). Her last appointment was on 10/14/2012. She had an episode of a small bowel obstruction last week which lasted 2 days and resolved spontaneously  on bowel rest. She did not have to go to  emergency room. She has severe chronic adhesive disease causing partial small bowel obstruction. She has had several abdominal surgeries including a subtotal colectomy and ileocolic anastomosis which is located at 20 cm from the rectum. She had unsuccessful lysis of adhesions in March 2011 and she has a stricture in the distal ileum which also causes ( flex sig 2012), low-grade GI bleeding. She has had iron infusions in the past; last one in February 2014. Her last hemoglobin on 12/08/2012 was 11.8. She has chronic diarrhea and known gastroparesis. She had a gastrocutaneous fistula following PEG removal in 2011. She is on fentanyl patches and Phenergan when necessary. Her weight has been stable at 102 pounds. Today, patient complains of dysphagia to solid foods which has slowly worsened.    Past Medical History  Diagnosis Date  . GERD (gastroesophageal reflux disease)   . CAD (coronary artery disease)     mild disease per cath in 2011  . Venous embolism and thrombosis of subclavian vein     after pacemaker insertion Oct 2011  . Pleural effusion      s/p right thoracentesis 03/09  . Small bowel obstruction   . Urinary incontinence      -INTERSTIM IMPLANT NOT FUNCTIONING PER PT  . RLS (restless legs syndrome)   . Primary dilated cardiomyopathy     EF 45 to 50% per echo in Jan 2012  . Vitamin B12 deficiency   . Gastroparesis   . Chronic nausea   . Chronic  abdominal pain   . Chronic pain syndrome   . Pacemaker     CRT therapy; followed by Dr. Klein  . H/O: GI bleed     from Pradaxa  . Oxygen dependent     2 liters via nasal cannula at all times  . CHF (congestive heart failure)   . Anemia   . Hx of stress fx aug 2011    right hip   . Elbow fracture, left aug 2012  . Arthritis     right hip  . Hx of gastric ulcer   . Diverticulitis   . HTN (hypertension)     under control; has been on med. x "years"  . Hx MRSA infection   . Pneumonia - 03/09, 12/09, 02/10, 06/10    MOST RECENT FEB 2013  . Elevated liver enzymes   . Blood transfusion   . Anginal pain   . Shortness of breath   . Fibromyalgia     COUPLE OF TIMES A YEAR-PT HAS EPISODES OF CONFUSION-USUALLY INVOLVES DAY/NIGHT REVERSAL AND EPISODES OF TWITCHING AND FALLS--SEES DR. WONG - NEUROLOGIST-LAST SEEN 07/10/11  . LBBB (left bundle branch block)   . Atrial fibrillation   . COPD (chronic obstructive pulmonary disease)     continuous O2- 2l  . Hepatitis     hx of in high school    Past Surgical History  Procedure Laterality Date  . Appendectomy    .   Pacemaker placement  12/21/2009  . Peg removed      with complications  . Tonsillectomy  age 4  . Interstim implant revision  03/06/2011    Procedure: REVISION OF INTERSTIM;  Surgeon: Scott A MacDiarmid, MD;  Location: [facility];  Service: Urology;  Laterality: N/A;  Replacement of Neurostimulator  . Portacath placement  12/16/2009  . Total abdominal hysterectomy      complete  . Interstim implant placement  05/28/2006 - stage I    06/05/2006 - stage II  . Interstim implant revision  10/23/2007  . Cystoscopy with injection  04/30/2006    transurethral collagen injection; incision vaginal stenosis  . Small intestine surgery  05/20/2001    ex. lap., resection of small bowel stricture; gastrostomy; insertion central line  . Cardiac catheterization  04/08/2006, 11/16/2009  . Cystoscopy  11/11/2008  . Cystoscopy w/ retrogrades  11/11/2008     right  . Colectomy      Intestinal resection (5times)  . Colectomy  02/04/2000    ex. lap., intra-abd. subtotal colectomy with ileosigmoid colon anastomosies and lysis of adhesions  . Gastrocutaneous fistula closure    . Exploratory laparotomy  04/27/2009    lysis of adhesions, gastrostomy tube  . Port-a-cath removal  07/27/2011    Procedure: REMOVAL PORT-A-CATH;  Surgeon: Matthew Wakefield, MD;  Location: [facility];  Service: General;  Laterality: N/A;  . Total hip arthroplasty  08/03/2011    Procedure: TOTAL HIP ARTHROPLASTY ANTERIOR APPROACH;  Surgeon: Christopher Y Blackman, MD;  Location: [facility];  Service: Orthopedics;  Laterality: Right;  . Interstim implant placement  02/05/2012    Procedure: INTERSTIM IMPLANT FIRST STAGE;  Surgeon: Scott A MacDiarmid, MD;  Location: [facility];  Service: Urology;  Laterality: N/A;  Replacement of Interstim Lead       reports that she quit smoking about 26 years ago. She has never used smokeless tobacco. She reports that she does not drink alcohol or use illicit drugs. family history includes Breast cancer in her maternal aunt; Diabetes in her paternal grandfather; Heart disease in her brother, father, mother, and paternal aunt; Stroke in her mother. There is no history of Colon cancer. Allergies  Allergen Reactions  . Dabigatran Etexilate Mesylate Other (See Comments)    INTERNAL BLEEDING-pradaxa  . Talwin [Pentazocine] Other (See Comments)    hallucinations  . Pentazocine Lactate Other (See Comments)    HALLUCINATIONS  . Sulfonamide Derivatives Other (See Comments)    JITTERINESS         Review of Systems: Negative for dysphagia. Weight stable. No rectal bleeding  The remainder of the 10 point ROS is negative except as outlined in H&P   Physical Exam: General appearance  thin chronically-appearing in no distress. With portable oxygen Eyes- non icteric. HEENT nontraumatic, normocephalic. Mouth no lesions, tongue  papillated, no cheilosis. Neck supple without adenopathy, thyroid not enlarged, no carotid bruits, no JVD. Lungs Clear to auscultation bilaterally. Cor normal S1, normal S2, regular rhythm, no murmur,  quiet precordium. Implantable pacemaker in the left chest. Abdomen: Multiple scars. Quiet bowel sounds. No tenderness. Increased tympany. Rectal: Not done. Extremities no pedal edema. Skin no lesions. Neurological alert and oriented x 3. Psychological normal mood and affect.  Assessment and Plan:  Problem #1 67-year-old white female with chronic low-grade small bowel obstruction. Patient is status post recent episode of small bowel obstruction which resolved on bowel rest. She is back on a low residue diet. She needs a refill on Phenergan. I   will see her in 3 months. She will have a CBC around December 5th.   Problem #2 Dysphagia. Patient complains of slow progression of dysphagia with solid foods. Due to her increased risk for complications under sedation, we will first complete a barium esophagram to assess for esophageal stricture.Continue PPI     01/13/2013 Dora Brodie  

## 2013-02-01 NOTE — Interval H&P Note (Signed)
History and Physical Interval Note:  02/01/2013 8:13 PM  [name]  has presented today for surgery, with the diagnosis of Patient has pacemaker, on oxygen. She is a hard IV stick.  The various methods of treatment have been discussed with the patient and family. After consideration of risks, benefits and other options for treatment, the patient has consented to  Procedure(s): ESOPHAGOGASTRODUODENOSCOPY (EGD) (N/A) SAVORY DILATION (N/A) as a surgical intervention .  The patient's history has been reviewed, patient examined, no change in status, stable for surgery.  I have reviewed the patient's chart and labs.  Questions were answered to the patient's satisfaction.     [name]

## 2013-02-02 ENCOUNTER — Encounter (HOSPITAL_COMMUNITY)
Admission: RE | Disposition: A | Discharge status: Home / Self Care | Payer: Self-pay | Source: Ambulatory Visit | Attending: Internal Medicine

## 2013-02-02 ENCOUNTER — Other Ambulatory Visit: Payer: Self-pay | Admitting: *Deleted

## 2013-02-02 ENCOUNTER — Ambulatory Visit (HOSPITAL_COMMUNITY)
Admission: RE | Admit: 2013-02-02 | Discharge: 2013-02-02 | Disposition: A | Discharge status: Home / Self Care | Payer: Medicare Other | Source: Ambulatory Visit | Attending: Internal Medicine | Admitting: Internal Medicine

## 2013-02-02 ENCOUNTER — Encounter (HOSPITAL_COMMUNITY): Payer: Self-pay | Admitting: *Deleted

## 2013-02-02 DIAGNOSIS — R933 Abnormal findings on diagnostic imaging of other parts of digestive tract: Secondary | ICD-10-CM

## 2013-02-02 DIAGNOSIS — R1310 Dysphagia, unspecified: Secondary | ICD-10-CM

## 2013-02-02 DIAGNOSIS — R197 Diarrhea, unspecified: Secondary | ICD-10-CM | POA: Insufficient documentation

## 2013-02-02 DIAGNOSIS — J4489 Other specified chronic obstructive pulmonary disease: Secondary | ICD-10-CM | POA: Insufficient documentation

## 2013-02-02 DIAGNOSIS — E46 Unspecified protein-calorie malnutrition: Secondary | ICD-10-CM | POA: Insufficient documentation

## 2013-02-02 DIAGNOSIS — R1319 Other dysphagia: Secondary | ICD-10-CM

## 2013-02-02 DIAGNOSIS — Z87891 Personal history of nicotine dependence: Secondary | ICD-10-CM | POA: Insufficient documentation

## 2013-02-02 DIAGNOSIS — K660 Peritoneal adhesions (postprocedural) (postinfection): Secondary | ICD-10-CM | POA: Insufficient documentation

## 2013-02-02 DIAGNOSIS — K2960 Other gastritis without bleeding: Secondary | ICD-10-CM | POA: Insufficient documentation

## 2013-02-02 DIAGNOSIS — R932 Abnormal findings on diagnostic imaging of liver and biliary tract: Secondary | ICD-10-CM

## 2013-02-02 DIAGNOSIS — K219 Gastro-esophageal reflux disease without esophagitis: Secondary | ICD-10-CM

## 2013-02-02 DIAGNOSIS — I509 Heart failure, unspecified: Secondary | ICD-10-CM | POA: Insufficient documentation

## 2013-02-02 DIAGNOSIS — J449 Chronic obstructive pulmonary disease, unspecified: Secondary | ICD-10-CM

## 2013-02-02 DIAGNOSIS — T17308A Unspecified foreign body in larynx causing other injury, initial encounter: Secondary | ICD-10-CM | POA: Insufficient documentation

## 2013-02-02 DIAGNOSIS — I428 Other cardiomyopathies: Secondary | ICD-10-CM | POA: Insufficient documentation

## 2013-02-02 DIAGNOSIS — I4891 Unspecified atrial fibrillation: Secondary | ICD-10-CM | POA: Insufficient documentation

## 2013-02-02 DIAGNOSIS — K269 Duodenal ulcer, unspecified as acute or chronic, without hemorrhage or perforation: Secondary | ICD-10-CM | POA: Insufficient documentation

## 2013-02-02 DIAGNOSIS — K2980 Duodenitis without bleeding: Secondary | ICD-10-CM | POA: Insufficient documentation

## 2013-02-02 DIAGNOSIS — I10 Essential (primary) hypertension: Secondary | ICD-10-CM | POA: Insufficient documentation

## 2013-02-02 DIAGNOSIS — K3184 Gastroparesis: Secondary | ICD-10-CM

## 2013-02-02 DIAGNOSIS — Z9981 Dependence on supplemental oxygen: Secondary | ICD-10-CM | POA: Insufficient documentation

## 2013-02-02 DIAGNOSIS — I2510 Atherosclerotic heart disease of native coronary artery without angina pectoris: Secondary | ICD-10-CM | POA: Insufficient documentation

## 2013-02-02 DIAGNOSIS — Z950 Presence of cardiac pacemaker: Secondary | ICD-10-CM | POA: Insufficient documentation

## 2013-02-02 DIAGNOSIS — IMO0002 Reserved for concepts with insufficient information to code with codable children: Secondary | ICD-10-CM | POA: Insufficient documentation

## 2013-02-02 DIAGNOSIS — Z9049 Acquired absence of other specified parts of digestive tract: Secondary | ICD-10-CM | POA: Insufficient documentation

## 2013-02-02 HISTORY — PX: SAVORY DILATION: SHX5439

## 2013-02-02 HISTORY — PX: ESOPHAGOGASTRODUODENOSCOPY: SHX5428

## 2013-02-02 SURGERY — EGD (ESOPHAGOGASTRODUODENOSCOPY)
Anesthesia: Moderate Sedation

## 2013-02-02 MED ORDER — FENTANYL CITRATE 0.05 MG/ML IJ SOLN
INTRAMUSCULAR | Status: AC
  Filled 2013-02-02: qty 2

## 2013-02-02 MED ORDER — ALBUTEROL SULFATE HFA 108 (90 BASE) MCG/ACT IN AERS: 2.0000 | INHALATION_SPRAY | Freq: Four times a day (QID) | RESPIRATORY_TRACT | Status: DC | PRN

## 2013-02-02 MED ORDER — SODIUM CHLORIDE 0.9 % IV SOLN: INTRAVENOUS | Status: DC

## 2013-02-02 MED ORDER — DIPHENHYDRAMINE HCL 50 MG/ML IJ SOLN
INTRAMUSCULAR | Status: DC | PRN
  Administered 2013-02-02: 12.5 mg via INTRAVENOUS

## 2013-02-02 MED ORDER — MIDAZOLAM HCL 10 MG/2ML IJ SOLN
INTRAMUSCULAR | Status: AC
  Filled 2013-02-02: qty 2

## 2013-02-02 MED ORDER — FENTANYL CITRATE 0.05 MG/ML IJ SOLN
INTRAMUSCULAR | Status: DC | PRN
  Administered 2013-02-02 (×2): 25 ug via INTRAVENOUS

## 2013-02-02 MED ORDER — MIDAZOLAM HCL 10 MG/2ML IJ SOLN
INTRAMUSCULAR | Status: DC | PRN
  Administered 2013-02-02 (×3): 2 mg via INTRAVENOUS

## 2013-02-02 MED ORDER — PANTOPRAZOLE SODIUM 40 MG PO TBEC: 40.0000 mg | DELAYED_RELEASE_TABLET | Freq: Two times a day (BID) | ORAL | Status: DC

## 2013-02-02 MED ORDER — OMEPRAZOLE 20 MG PO CPDR: 20.0000 mg | DELAYED_RELEASE_CAPSULE | Freq: Two times a day (BID) | ORAL | Status: DC

## 2013-02-02 MED ORDER — BUTAMBEN-TETRACAINE-BENZOCAINE 2-2-14 % EX AERO
INHALATION_SPRAY | CUTANEOUS | Status: DC | PRN
  Administered 2013-02-02: 2 via TOPICAL

## 2013-02-02 MED ORDER — DIPHENHYDRAMINE HCL 50 MG/ML IJ SOLN
INTRAMUSCULAR | Status: AC
  Filled 2013-02-02: qty 1

## 2013-02-02 NOTE — Interval H&P Note (Signed)
History and Physical Interval Note:  02/02/2013 10:18 AM  [name]  has presented today for surgery, with the diagnosis of Patient has pacemaker, on oxygen. She is a hard IV stick.  The various methods of treatment have been discussed with the patient and family. After consideration of risks, benefits and other options for treatment, the patient has consented to  Procedure(s): ESOPHAGOGASTRODUODENOSCOPY (EGD) (N/A) SAVORY DILATION (N/A) as a surgical intervention .  The patient's history has been reviewed, patient examined, no change in status, stable for surgery.  I have reviewed the patient's chart and labs.  Questions were answered to the patient's satisfaction.     [name]

## 2013-02-02 NOTE — Op Note (Signed)
[facility] [address] [place], [address]   ENDOSCOPY PROCEDURE REPORT  PATIENT: [name], [name]  MR#: [id number] BIRTHDATE: 08-29-1945 , 67  yrs. old GENDER: Female ENDOSCOPIST: Hart Carwin, MD REFERRED BY:  Loreen Freud, DO PROCEDURE DATE:  02/02/2013 PROCEDURE:  EGD w/ biopsy and Savary dilation of esophagus ASA CLASS:     Class III INDICATIONS:  Dysphagia.   dysphagia to solids.  Upper normal barium esophagram.  Barium tablet the retained foot 2 and half remaining and distal esophagus.  Normal peristalsis.  Intermittent aspiration.  History of gastroparesis, chronic low-grade small bowel obstruction due to adhesions, history of GI bleed due to Pradaxa. MEDICATIONS: These medications were titrated to patient response per physician's verbal order, Benadryl 12.5 mg IV, Fentanyl 50 mcg IV, and Versed 6 mg IV TOPICAL ANESTHETIC: Cetacaine Spray  DESCRIPTION OF PROCEDURE: After the risks benefits and alternatives of the procedure were thoroughly explained, informed consent was obtained.  The PENTAX GASTOROSCOPE [id number] endoscope was introduced through the mouth and advanced to the second portion of the duodenum. Without limitations.  The instrument was slowly withdrawn as the mucosa was fully examined.      Esophagus: [  proximal esophageal mucosa was normal. Esophageal lumen appeared normal. Squamocolumnar junction appeared slightly irregular this was a soft room in but there was no hiatal hernia. There was no retained food or liquid in the esophagus,endoscope traversed into the stomach without resistance. Distal esophageal lumen was slightly angulated  Stomach: There was a mild to moderate amount of bilious material retained in the stomach but there was no solid food. Rugal folds were normal. There are well multiple linear erosions throughout the body of the stomach in the gastric antrum. There was old scar from previous gastrostomy. Gastric  outlet folds widely open. Retroflexion of the endoscope revealed normal fundus and cardia  Duodenum: Duodenal bulb walls spacious as well as the second portion of the duodenum did show some retained bilious material. The lumen was mildly dilated and there were multiple erosions throughout the bulb and descending duodenum. Biopsies were taken the mucosa was somewhat fibrotis  and non-distensible       The scope was then withdrawn from the patient and the procedure completed.  Guidewire was placed into the stomach and a Savary dilator 16 mm passed over the guidewire without much resistance. There was small amount of blood on the dilator  COMPLICATIONS: There were no complications. ENDOSCOPIC IMPRESSION:  no evidence of esophageal stricture. Status post passage of 16 mm Savary dilator Tainted bilious material in the stomach and slightly dilated descending duodenum consistent with chronic small bowel obstruction Duodenitis. Status post biopsies The above findings are consistent with chronic small bowel obstruction and gastroparesis as well as chronic gastroesophageal reflux. The dysphagia is likely related to retained gastric material regurgitating back into the esophagus. She has occasional aspiration on barium esophagram. She has a appointment in speech pathology for evaluation of aspiration. She will continue on strict gastroparesis diet  and anti-reflex measures. Continue PPI   RECOMMENDATIONS: Await biopsy results antireflux measures Gastroparesis diet Continue PPI Speech pathology evaluation  REPEAT EXAM: for EGD pending biopsy results.  eSigned:  Hart Carwin, MD 02/02/2013 11:37 AM   CC:  PATIENT NAME:  Norinne, Jeane MR#: [id number]

## 2013-02-03 ENCOUNTER — Encounter (HOSPITAL_COMMUNITY): Payer: Self-pay | Admitting: Internal Medicine

## 2013-02-03 ENCOUNTER — Telehealth: Payer: Self-pay | Admitting: *Deleted

## 2013-02-03 NOTE — Telephone Encounter (Signed)
Sp[facility] with [facility]ony [facility] and she states they called patient on 01/22/13 and requested to call them to schedule speech evaulation and patient has not called. Attempted to call patient. Her number has been disconnected.

## 2013-02-03 NOTE — Telephone Encounter (Signed)
Spoke with patient's husband and he states he will have patient call and schedule the speech evaluation.

## 2013-02-04 ENCOUNTER — Encounter: Payer: Self-pay | Admitting: Internal Medicine

## 2013-02-04 ENCOUNTER — Other Ambulatory Visit: Payer: Self-pay | Admitting: Family Medicine

## 2013-02-04 NOTE — Telephone Encounter (Signed)
Last seen 01/09/13 and filled 08/06/12 #60 with 5 refills. Please advise      KP

## 2013-02-05 ENCOUNTER — Ambulatory Visit (INDEPENDENT_AMBULATORY_CARE_PROVIDER_SITE_OTHER): Payer: Medicare Other | Admitting: *Deleted

## 2013-02-05 DIAGNOSIS — M810 Age-related osteoporosis without current pathological fracture: Secondary | ICD-10-CM

## 2013-02-05 MED ORDER — DENOSUMAB 60 MG/ML ~~LOC~~ SOLN
60.0000 mg | Freq: Once | SUBCUTANEOUS | Status: AC
  Administered 2013-02-05: 60 mg via SUBCUTANEOUS

## 2013-02-16 ENCOUNTER — Other Ambulatory Visit: Payer: Self-pay | Admitting: *Deleted

## 2013-02-16 DIAGNOSIS — R52 Pain, unspecified: Secondary | ICD-10-CM

## 2013-02-16 MED ORDER — OXYCODONE-ACETAMINOPHEN 10-325 MG PO TABS: 1.0000 | ORAL_TABLET | ORAL | Status: DC | PRN

## 2013-02-16 NOTE — Telephone Encounter (Signed)
Last seen-01/09/2013  Last filled-01/06/2013 #120, 0 refills  [facility]-not on file, contract signed  Please advise. SW

## 2013-02-17 ENCOUNTER — Other Ambulatory Visit: Payer: Self-pay | Admitting: Family Medicine

## 2013-02-17 NOTE — Telephone Encounter (Signed)
Last seen 01/09/13 and filled 01/20/13 # 60. Please advise      KP

## 2013-02-18 ENCOUNTER — Encounter: Payer: Self-pay | Admitting: Family Medicine

## 2013-02-18 ENCOUNTER — Other Ambulatory Visit: Payer: Self-pay | Admitting: *Deleted

## 2013-02-18 ENCOUNTER — Telehealth: Payer: Self-pay | Admitting: Family Medicine

## 2013-02-18 ENCOUNTER — Telehealth: Payer: Self-pay | Admitting: *Deleted

## 2013-02-18 MED ORDER — PROMETHAZINE HCL 25 MG PO TABS: 25.0000 mg | ORAL_TABLET | Freq: Three times a day (TID) | ORAL | Status: DC | PRN

## 2013-02-18 NOTE — Telephone Encounter (Signed)
Patient would like to know if she could come in get a B12 injection. Patient last injection was on 11/19/2012. Please advise. SW

## 2013-02-18 NOTE — Telephone Encounter (Signed)
Patient called and request a refill for fentaNYL (DURAGESIC - DOSED MCG/HR) 50 MCG/HR and oxyCODONE-acetaminophen (PERCOCET) 10-325 MG per tablet

## 2013-02-18 NOTE — Telephone Encounter (Signed)
Ok to get injection

## 2013-02-18 NOTE — Telephone Encounter (Signed)
Last seen 01/09/13 and filled 01/06/13. Please advise     KP

## 2013-02-18 NOTE — Telephone Encounter (Signed)
Ok to refill x 1  

## 2013-02-19 ENCOUNTER — Ambulatory Visit (INDEPENDENT_AMBULATORY_CARE_PROVIDER_SITE_OTHER): Payer: Medicare Other | Admitting: *Deleted

## 2013-02-19 DIAGNOSIS — E538 Deficiency of other specified B group vitamins: Secondary | ICD-10-CM

## 2013-02-19 MED ORDER — CYANOCOBALAMIN 1000 MCG/ML IJ SOLN
1000.0000 ug | Freq: Once | INTRAMUSCULAR | Status: AC
  Administered 2013-02-19: 1000 ug via INTRAMUSCULAR

## 2013-02-19 NOTE — Telephone Encounter (Signed)
Patient was given prescription for phenergan instead of fentanyl. Patient was notified and stated that she will wait until Monday and bring the old prescription back and get the prescription for fentanyl. SW

## 2013-02-19 NOTE — Telephone Encounter (Signed)
Patient need the fentanyl refilled.

## 2013-02-20 ENCOUNTER — Telehealth: Payer: Self-pay | Admitting: *Deleted

## 2013-02-20 DIAGNOSIS — R1310 Dysphagia, unspecified: Secondary | ICD-10-CM

## 2013-02-20 NOTE — Telephone Encounter (Signed)
Message     [name], could you, please schedule Modified Barium study? With speech? pathologist        ----- Message -----    From: [name]    Sent: 02/20/2013 2:10 PM    To: Becky Carwin, MD, Daphine Deutscher, RN    Subject: additional info needed         Per speech pathologist [name]), the radiologist recommended modified barium study, this must be done before scheduling a speech evaluation. You may contact [name] @ [phone] to schedule an appointment. Thank you

## 2013-02-23 ENCOUNTER — Telehealth: Payer: Self-pay | Admitting: Family Medicine

## 2013-02-23 DIAGNOSIS — R52 Pain, unspecified: Secondary | ICD-10-CM

## 2013-02-23 MED ORDER — FENTANYL 50 MCG/HR TD PT72: 50.0000 ug | MEDICATED_PATCH | TRANSDERMAL | Status: DC

## 2013-02-23 NOTE — Telephone Encounter (Signed)
Patient aware Rx ready--   KP

## 2013-02-23 NOTE — Telephone Encounter (Signed)
Spoke with [facility]. They will call patient and schedule Mod. Barium study.

## 2013-02-23 NOTE — Telephone Encounter (Signed)
Patient is coming to pick up her Fentanyl rx tomorrow and wants to make sure it is ready.   See previous phone note on 02/19/13:  "Patient was given prescription for phenergan instead of fentanyl. Patient was notified and stated that she will wait until Monday and bring the old prescription back and get the prescription for fentanyl. SW."     Please advise.

## 2013-02-26 ENCOUNTER — Other Ambulatory Visit (HOSPITAL_COMMUNITY): Payer: Self-pay | Admitting: Internal Medicine

## 2013-02-26 DIAGNOSIS — R1310 Dysphagia, unspecified: Secondary | ICD-10-CM

## 2013-03-05 ENCOUNTER — Telehealth: Payer: Self-pay | Admitting: *Deleted

## 2013-03-05 NOTE — Telephone Encounter (Signed)
Spoke with patient and she will come for labs. 

## 2013-03-05 NOTE — Telephone Encounter (Signed)
Message copied by Daphine Deutscher on Thu Mar 05, 2013 10:01 AM ------      Message from: Daphine Deutscher      Created: Tue Dec 09, 2012  9:25 AM       Call and remind patient due for CBC diff on 03/09/13 for DB ------

## 2013-03-06 ENCOUNTER — Other Ambulatory Visit: Payer: Self-pay | Admitting: Family Medicine

## 2013-03-06 ENCOUNTER — Ambulatory Visit (HOSPITAL_COMMUNITY)
Admission: RE | Admit: 2013-03-06 | Discharge: 2013-03-06 | Disposition: A | Discharge status: Home / Self Care | Payer: Medicare Other | Source: Ambulatory Visit | Attending: Internal Medicine | Admitting: Internal Medicine

## 2013-03-06 ENCOUNTER — Other Ambulatory Visit (INDEPENDENT_AMBULATORY_CARE_PROVIDER_SITE_OTHER): Payer: Medicare Other

## 2013-03-06 ENCOUNTER — Other Ambulatory Visit: Payer: Self-pay | Admitting: *Deleted

## 2013-03-06 ENCOUNTER — Ambulatory Visit (INDEPENDENT_AMBULATORY_CARE_PROVIDER_SITE_OTHER): Payer: Medicare Other | Admitting: *Deleted

## 2013-03-06 ENCOUNTER — Telehealth: Payer: Self-pay | Admitting: *Deleted

## 2013-03-06 DIAGNOSIS — D649 Anemia, unspecified: Secondary | ICD-10-CM

## 2013-03-06 DIAGNOSIS — I5022 Chronic systolic (congestive) heart failure: Secondary | ICD-10-CM

## 2013-03-06 DIAGNOSIS — R1310 Dysphagia, unspecified: Secondary | ICD-10-CM | POA: Insufficient documentation

## 2013-03-06 DIAGNOSIS — I428 Other cardiomyopathies: Secondary | ICD-10-CM

## 2013-03-06 LAB — MDC_IDC_ENUM_SESS_TYPE_INCLINIC
Battery Remaining Longevity: 45 mo
Battery Voltage: 2.99 V
Brady Statistic AP VP Percent: 0.01 %
Brady Statistic AP VS Percent: 0 %
Brady Statistic AS VP Percent: 99.9 %
Brady Statistic AS VS Percent: 0.09 %
Brady Statistic RA Percent Paced: 0.01 %
Brady Statistic RV Percent Paced: 99.91 %
Date Time Interrogation Session: 20141219100055
Lead Channel Impedance Value: 342 Ohm
Lead Channel Impedance Value: 380 Ohm
Lead Channel Impedance Value: 475 Ohm
Lead Channel Impedance Value: 494 Ohm
Lead Channel Impedance Value: 513 Ohm
Lead Channel Impedance Value: 513 Ohm
Lead Channel Impedance Value: 551 Ohm
Lead Channel Impedance Value: 589 Ohm
Lead Channel Impedance Value: 836 Ohm
Lead Channel Pacing Threshold Amplitude: 0.5 V
Lead Channel Pacing Threshold Amplitude: 0.5 V
Lead Channel Pacing Threshold Amplitude: 0.625 V
Lead Channel Pacing Threshold Pulse Width: 0.4 ms
Lead Channel Pacing Threshold Pulse Width: 0.4 ms
Lead Channel Pacing Threshold Pulse Width: 0.8 ms
Lead Channel Sensing Intrinsic Amplitude: 0.75 mV
Lead Channel Sensing Intrinsic Amplitude: 0.75 mV
Lead Channel Sensing Intrinsic Amplitude: 6.875 mV
Lead Channel Sensing Intrinsic Amplitude: 8.375 mV
Lead Channel Setting Pacing Amplitude: 1.25 V
Lead Channel Setting Pacing Amplitude: 2 V
Lead Channel Setting Pacing Amplitude: 2.5 V
Lead Channel Setting Pacing Pulse Width: 0.4 ms
Lead Channel Setting Pacing Pulse Width: 0.8 ms
Lead Channel Setting Sensing Sensitivity: 0.9 mV
Zone Setting Detection Interval: 350 ms
Zone Setting Detection Interval: 400 ms

## 2013-03-06 LAB — CBC WITH DIFFERENTIAL/PLATELET
Basophils Absolute: 0 10*3/uL (ref 0.0–0.1)
Basophils Relative: 0.3 % (ref 0.0–3.0)
Eosinophils Absolute: 0.1 10*3/uL (ref 0.0–0.7)
Eosinophils Relative: 1.3 % (ref 0.0–5.0)
HCT: 36.9 % (ref 36.0–46.0)
Hemoglobin: 12.1 g/dL (ref 12.0–15.0)
Lymphocytes Relative: 9 % — ABNORMAL LOW (ref 12.0–46.0)
Lymphs Abs: 0.6 10*3/uL — ABNORMAL LOW (ref 0.7–4.0)
MCHC: 32.7 g/dL (ref 30.0–36.0)
MCV: 87.7 fl (ref 78.0–100.0)
Monocytes Absolute: 0.5 10*3/uL (ref 0.1–1.0)
Monocytes Relative: 7.2 % (ref 3.0–12.0)
Neutro Abs: 5.9 10*3/uL (ref 1.4–7.7)
Neutrophils Relative %: 82.2 % — ABNORMAL HIGH (ref 43.0–77.0)
Platelets: 222 10*3/uL (ref 150.0–400.0)
RBC: 4.21 Mil/uL (ref 3.87–5.11)
RDW: 12.5 % (ref 11.5–14.6)
WBC: 7.2 10*3/uL (ref 4.5–10.5)

## 2013-03-06 NOTE — Progress Notes (Signed)
In office CRT-P device check. Normal device function. Thresholds, sensing, impedance consistent with previous measurements. Histograms appropriate for patient and level of activity. No mode switches or ventricular high rate episodes. Patient bi-ventricularly pacing >100% of the time.  She was seen today for intermittent phrenic nerve stimulation.  She was tested with different configurations and left LV ring -> RV ring but the output was decreased to 1.25@ 0.8.   Device programmed with appropriate safety margins. Device heart failure diagnostics show an increase in opitvol and thoracic impedance ongoing since 12/5.  The patient has bilateral edema noted above her sock line in her calves.  A message was sent to Dr. Antoine Poche to address this.   Estimated longevity 3.5 years.  ROV in April with the device clinic.

## 2013-03-06 NOTE — Telephone Encounter (Signed)
Per Gunnar Fusi in device clinic - fluid volume up since 12/5. Edema in calves bilaterally - on Lasix 40 mg a day. No other complaints - will forward to MD for review and any orders.

## 2013-03-06 NOTE — Procedures (Signed)
Objective Swallowing Evaluation: Modified Barium Swallowing Study  Patient Details  Name: [name] MRN: [id number] Date of Birth: 04/24/1945  Today's Date: 03/06/2013 Time: 4098-1191 SLP Time Calculation (min): 40 min  Past Medical History:  Past Medical History  Diagnosis Date  . GERD (gastroesophageal reflux disease)   . CAD (coronary artery disease)     mild disease per cath in 2011  . Venous embolism and thrombosis of subclavian vein     after pacemaker insertion Oct 2011  . Pleural effusion      s/p right thoracentesis 03/09  . Small bowel obstruction   . Urinary incontinence      -INTERSTIM IMPLANT NOT FUNCTIONING PER PT  . RLS (restless legs syndrome)   . Primary dilated cardiomyopathy     EF 45 to 50% per echo in Jan 2012  . Vitamin B12 deficiency   . Gastroparesis   . Chronic nausea   . Chronic abdominal pain   . Chronic pain syndrome   . Pacemaker     CRT therapy; followed by Dr. Graciela Husbands  . H/O: GI bleed     from Pradaxa  . Oxygen dependent     2 liters via nasal cannula at all times  . CHF (congestive heart failure)   . Anemia   . Hx of stress fx aug 2011    right hip   . Elbow fracture, left aug 2012  . Arthritis     right hip  . Hx of gastric ulcer   . Diverticulitis   . HTN (hypertension)     under control; has been on med. x "years"  . Hx MRSA infection   . Pneumonia - 03/09, 12/09, 02/10, 06/10    MOST RECENT FEB 2013  . Elevated liver enzymes   . Blood transfusion   . Anginal pain   . Shortness of breath   . Fibromyalgia     COUPLE OF TIMES A YEAR-PT HAS EPISODES OF CONFUSION-USUALLY INVOLVES DAY/NIGHT REVERSAL AND EPISODES OF TWITCHING AND FALLS--SEES DR. Modesto Charon - NEUROLOGIST-LAST SEEN 07/10/11  . LBBB (left bundle branch block)   . Atrial fibrillation   . COPD (chronic obstructive pulmonary disease)     continuous O2- 2l  . Hepatitis     hx of in high school    Past Surgical History:  Past Surgical History  Procedure Laterality Date   . Appendectomy    . Pacemaker placement  12/21/2009  . Peg removed      with complications  . Tonsillectomy  age 32  . Interstim implant revision  03/06/2011    Procedure: REVISION OF Leane Platt;  Surgeon: Martina Sinner, MD;  Location: [facility];  Service: Urology;  Laterality: N/A;  Replacement of Neurostimulator  . Portacath placement  12/16/2009  . Total abdominal hysterectomy      complete  . Interstim implant placement  05/28/2006 - stage I    06/05/2006 - stage II  . Interstim implant revision  10/23/2007  . Cystoscopy with injection  04/30/2006    transurethral collagen injection; incision vaginal stenosis  . Small intestine surgery  05/20/2001    ex. lap., resection of small bowel stricture; gastrostomy; insertion central line  . Cardiac catheterization  04/08/2006, 11/16/2009  . Cystoscopy  11/11/2008  . Cystoscopy w/ retrogrades  11/11/2008    right  . Colectomy      Intestinal resection (5times)  . Colectomy  02/04/2000    ex. lap., intra-abd. subtotal colectomy with ileosigmoid colon anastomosies and lysis  of adhesions  . Gastrocutaneous fistula closure    . Exploratory laparotomy  04/27/2009    lysis of adhesions, gastrostomy tube  . Port-a-cath removal  07/27/2011    Procedure: REMOVAL PORT-A-CATH;  Surgeon: Emelia Loron, MD;  Location: [facility];  Service: General;  Laterality: N/A;  . Total hip arthroplasty  08/03/2011    Procedure: TOTAL HIP ARTHROPLASTY ANTERIOR APPROACH;  Surgeon: Kathryne Hitch, MD;  Location: [facility];  Service: Orthopedics;  Laterality: Right;  . Interstim implant placement  02/05/2012    Procedure: INTERSTIM IMPLANT FIRST STAGE;  Surgeon: Martina Sinner, MD;  Location: [facility];  Service: Urology;  Laterality: N/A;  Replacement of Interstim Lead     . Esophagogastroduodenoscopy N/A 02/02/2013    Procedure: ESOPHAGOGASTRODUODENOSCOPY (EGD);  Surgeon: Hart Carwin, MD;  Location: [facility];  Service: Endoscopy;  Laterality:  N/A;  . Savory dilation N/A 02/02/2013    Procedure: SAVORY DILATION;  Surgeon: Hart Carwin, MD;  Location: [facility];  Service: Endoscopy;  Laterality: N/A;   HPI:  67 yo female with medical h/o COPD, acid reflux, arthritis, pna referred by Dr Juanda Chance for MBS.  Pt PMH also + for gastroparesis and bowel issues.  Pt has undergone endoscopy in Nov 2014 and reports improved dysphagia symptoms since this eval/tx.  Per review of endoscopy, pt with no evidence of stricture and recommendations were to continue antireflux measures and gastroparesis diet.  Pt underwent esophagram Jan 16, 2013 with silent aspiration noted, esophageal motility WNL - no stricture or evidence of reflux.  Barium tablet temporarily delayed at distal region but did pass into stomach.  Pt reports dysphagia symptoms as sensation of food lodging in pharynx at times requiring her expectorate it.  She states consuming liquids does not clear food stasis in throat.  Pt also reports occasional  "gurgling sound" at throat when she swallows solids.  Stasis occurs approximately twice a week and has been present and stable x1 year.  Liquids are subjectively easier for pt to swallow.  She denies weight loss nor recent pulmonary infections, admits to chronic xerostomia.  Pt is also on oxygen at home for her COPD x approx one year.       Assessment / Plan / Recommendation Clinical Impression  Dysphagia Diagnosis: Within Functional Limits;Mild cervical esophageal phase dysphagia Clinical impression: Oropharyngeal swallow ability was within functional limits for pt without aspiration of any consistency tested.  Trace intermittent laryngeal penetration of thin liquids cleared independently and was functional for pt's age.  Pharyngeal swallow is strong without stasis in pharynx.  Appearance of mild stasis of pudding/solid below UES/CP that liquid swallows helped to clear.   Educated pt to aspiration precautions due to her COPD possibly impacting  swallow/respiration coordination.  Also provided compensation strategies in hopes of mitigating pt's symptoms, eg; start meals with liquids, follow solids with liquids, eat several small meals, rest break if short of breath,etc.  Please note, dry swallows were difficult for pt to conduct - ? source.  Pt did not cough nor complain of significant pharyngeal stasis nor expectorate during testing therefore unable to fully identify source of symptoms.     Using teach back and visual feedback/monitor, pt was educated and effective strategies were reinforced.  Thanks for this consult.     Treatment Recommendation    Defer to MD if outpt indicated for generalization of compensation strategies and education   Diet Recommendation Thin liquid;Regular (use caution with mixed consistencies, particulate foods)  Liquid Administration via: Cup;Straw Medication Administration: Whole meds with puree (start and follow with liquids) Supervision: Patient able to self feed Compensations: Slow rate;Small sips/bites;Follow solids with liquid (several small meals) Postural Changes and/or Swallow Maneuvers: Seated upright 90 degrees;Upright 30-60 min after meal (tuck chin if found to decrease symptoms)    Other  Recommendations Oral Care Recommendations: Oral care BID   Follow Up Recommendations   (defer to referring MD)            General Date of Onset: 03/06/13 HPI: 67 yo female with medical h/o COPD, acid reflux, arthritis, pna referred by Dr Juanda Chance for MBS.  Pt PMH also + for gastroparesis and bowel issues.  Pt has undergone endoscopy in Nov 2014 and reports improved dysphagia symptoms since this eval/tx.  Per review of endoscopy, pt with no evidence of stricture and recommendations were to continue antireflux measures and gastroparesis diet.  Pt underwent esophagram Jan 16, 2013 with silent aspiration noted, esophageal motility WNL - no stricture or evidence of reflux.  Barium tablet temporarily delayed at distal  region but did pass into stomach.  Pt reports dysphagia symptoms as sensation of food lodging in pharynx at times requiring her expectorate it.  She states consuming liquids does not clear food stasis in throat.  Pt also reports occasional  "gurgling sound" at throat when she swallows solids.  Stasis occurs approximately twice a week and has been present and stable x1 year.  Liquids are subjectively easier for pt to swallow.  She denies weight loss nor recent pulmonary infections, admits to chronic xerostomia.  Pt is also on oxygen at home for her COPD x approx one year.   Type of Study: Modified Barium Swallowing Study Reason for Referral: Objectively evaluate swallowing function Diet Prior to this Study: Regular;Thin liquids Temperature Spikes Noted: No Respiratory Status: Nasal cannula Behavior/Cognition: Alert Oral Cavity - Dentition: Adequate natural dentition Oral Motor / Sensory Function:  (? mild pullling of soft palate to right side with phonation, pt leaning right- denies neuro hx) Self-Feeding Abilities: Able to feed self Patient Positioning: Upright in chair Baseline Vocal Quality: Clear Volitional Cough: Strong Volitional Swallow: Able to elicit Anatomy: Within functional limits Pharyngeal Secretions: Not observed secondary MBS    Reason for Referral Objectively evaluate swallowing function   Oral Phase Oral Preparation/Oral Phase Oral Phase: WFL Oral Phase - Comment Oral Phase - Comment: dry swallow observed to be difficult for pt to produce, ? d/t xerostomia and/or known reflux/gastroparesis issues   Pharyngeal Phase Pharyngeal Phase Pharyngeal Phase: Within functional limits Pharyngeal Phase - Comment Pharyngeal Comment: Trace laryngeal penetration of thin was functional for pt's age- pt even taxed to consume large sequential boluses without aspiration observed.  Did note cued dry swallow was difficult for pt to perform.    Cervical Esophageal Phase    GO    Cervical  Esophageal Phase Cervical Esophageal Phase: Impaired Cervical Esophageal Phase - Comment Cervical Esophageal Comment: Appearance of prominent cricopharyngeus contributing to mild stasis of solids more tha liquids below UES with occasional pt awareness/sensation, liquid swallows appeared to facilitate clearance, Did not test barium tablet as was tested by radiologist in Oct 2014, radiologist not present to confirm findings    Functional Assessment Tool Used: mbs, clinical judgement Functional Limitations: Swallowing Swallow Current Status (W1191): At least 1 percent but less than 20 percent impaired, limited or restricted Swallow Goal Status [phone]): At least 1 percent but less than 20 percent impaired, limited or restricted Swallow Discharge Status (  M5784): At least 1 percent but less than 20 percent impaired, limited or restricted    Donavan Burnet, MS [facility] SLP [phone]

## 2013-03-06 NOTE — Telephone Encounter (Signed)
Last seen 01/09/13 and filled 02/04/13 360. Please advise      KP

## 2013-03-09 ENCOUNTER — Other Ambulatory Visit: Payer: Self-pay | Admitting: Family Medicine

## 2013-03-10 ENCOUNTER — Ambulatory Visit: Payer: Medicare Other | Admitting: Internal Medicine

## 2013-03-13 ENCOUNTER — Other Ambulatory Visit: Payer: Self-pay | Admitting: Family Medicine

## 2013-03-13 NOTE — Telephone Encounter (Signed)
She needs a follow up appt.  No hurry.

## 2013-03-14 ENCOUNTER — Other Ambulatory Visit: Payer: Self-pay | Admitting: Family Medicine

## 2013-03-16 ENCOUNTER — Other Ambulatory Visit: Payer: Medicare Other

## 2013-03-17 NOTE — Telephone Encounter (Signed)
Pt aware for the need for an appt.  She reports feeling fine.  Her father is ill in [place] and she is leaving to fly down to be with him.  She will call back to schedule.

## 2013-03-18 ENCOUNTER — Telehealth: Payer: Self-pay | Admitting: *Deleted

## 2013-03-18 LAB — URINE CULTURE: Colony Count: >=100000

## 2013-03-18 NOTE — Telephone Encounter (Signed)
Patients husband presented tot he office requesting a letter of necessity for pt to use a portable oxygen tank while travel on an aircraft. Letter of medical necessity printed and given to patient.

## 2013-03-20 ENCOUNTER — Telehealth: Payer: Self-pay

## 2013-03-20 MED ORDER — CIPROFLOXACIN HCL 500 MG PO TABS: 500.0000 mg | ORAL_TABLET | Freq: Two times a day (BID) | ORAL | Status: DC

## 2013-03-20 NOTE — Telephone Encounter (Signed)
Message copied by Ewing Schlein on Fri Mar 20, 2013  9:46 AM ------      Message from: Rosalita Chessman      Created: Fri Mar 20, 2013  8:46 AM       + UTI=  Resistant to macrobid--- change to cipro 500 mg 1 po bid for 5 days ------

## 2013-03-20 NOTE — Telephone Encounter (Signed)
Spoke with patient and she voiced understanding, Rx sent      KP

## 2013-03-26 ENCOUNTER — Telehealth: Payer: Self-pay | Admitting: Family Medicine

## 2013-03-26 DIAGNOSIS — R52 Pain, unspecified: Secondary | ICD-10-CM

## 2013-03-26 MED ORDER — OXYCODONE-ACETAMINOPHEN 10-325 MG PO TABS: 1.0000 | ORAL_TABLET | ORAL | Status: DC | PRN

## 2013-03-26 NOTE — Telephone Encounter (Signed)
Refill x1 

## 2013-03-26 NOTE — Telephone Encounter (Signed)
Last seen 01/09/13 and filled 02/16/13 #120. UDS 10/14 low risk Please advise      KP

## 2013-03-26 NOTE — Telephone Encounter (Signed)
Patient is calling to request a refill on her oxyCODONE-acetaminophen (PERCOCET) 10-325 MG and Fentanyl pain patches. Please advise.

## 2013-03-26 NOTE — Telephone Encounter (Signed)
Patient aware Rx ready for pick up tomorrow.    KP

## 2013-04-02 ENCOUNTER — Other Ambulatory Visit: Payer: Self-pay | Admitting: Family Medicine

## 2013-04-03 ENCOUNTER — Encounter: Payer: Self-pay | Admitting: Internal Medicine

## 2013-04-10 ENCOUNTER — Ambulatory Visit (INDEPENDENT_AMBULATORY_CARE_PROVIDER_SITE_OTHER): Payer: Medicare Other | Admitting: Family Medicine

## 2013-04-10 ENCOUNTER — Encounter: Payer: Self-pay | Admitting: Family Medicine

## 2013-04-10 VITALS — BP 140/84 | HR 89 | Temp 97.9°F | Resp 18 | Wt 103.2 lb

## 2013-04-10 DIAGNOSIS — J441 Chronic obstructive pulmonary disease with (acute) exacerbation: Secondary | ICD-10-CM

## 2013-04-10 MED ORDER — AMOXICILLIN 500 MG PO CAPS: 500.0000 mg | ORAL_CAPSULE | Freq: Two times a day (BID) | ORAL | Status: DC

## 2013-04-10 MED ORDER — PROMETHAZINE-DM 6.25-15 MG/5ML PO SYRP: 5.0000 mL | ORAL_SOLUTION | Freq: Four times a day (QID) | ORAL | Status: DC | PRN

## 2013-04-10 NOTE — Progress Notes (Signed)
Pre visit review using our clinic review tool, if applicable. No additional management support is needed unless otherwise documented below in the visit note. 

## 2013-04-10 NOTE — Assessment & Plan Note (Signed)
New to provider, recurrent issue for pt.  Mild.  No current wheezes or rhonchi but wet, hacking cough.  Start abx.  Continue inhalers.  Reviewed supportive care and red flags that should prompt return.  Pt expressed understanding and is in agreement w/ plan.

## 2013-04-10 NOTE — Patient Instructions (Signed)
Follow up as needed Start the Amoxicillin twice daily Use the cough syrup as needed- will cause drowsiness Mucinex to thin congestion REST!! Hang in there!!! Fly Safely!

## 2013-04-10 NOTE — Progress Notes (Signed)
   Subjective:    Patient ID: [name], female    DOB: 13-Mar-1946, 68 y.o.   MRN: [id number]  HPI URI- sxs started on Sunday w/ 'croupy cough'.  + body aches, fatigue.  No sinus pressure.  No ear pain.  Sore throat x1 day.  + hoarseness.  Cough is wet but not productive.  + sick contacts.  Hx of COPD, O2 dependent.  On Spiriva, albuterol.   Review of Systems For ROS see HPI     Objective:   Physical Exam  Vitals reviewed. Constitutional: She is oriented to person, place, and time. She appears well-developed and well-nourished. No distress.  HENT:  Head: Normocephalic and atraumatic.  Mouth/Throat: Oropharynx is clear and moist.  TMs normal bilaterally O2 in place via  Mild nasal congestion Throat w/out erythema, edema, or exudate  Eyes: Conjunctivae and EOM are normal. Pupils are equal, round, and reactive to light.  Neck: Normal range of motion. Neck supple.  Cardiovascular: Normal rate, regular rhythm, normal heart sounds and intact distal pulses.   No murmur heard. Pulmonary/Chest: Effort normal and breath sounds normal. No respiratory distress. She has no wheezes. She has no rales.  + wet, hacking cough  Lymphadenopathy:    She has no cervical adenopathy.  Neurological: She is alert and oriented to person, place, and time.          Assessment & Plan:

## 2013-04-12 ENCOUNTER — Other Ambulatory Visit: Payer: Self-pay | Admitting: Family Medicine

## 2013-04-13 ENCOUNTER — Other Ambulatory Visit: Payer: Self-pay | Admitting: *Deleted

## 2013-04-13 MED ORDER — TEMAZEPAM 15 MG PO CAPS: ORAL_CAPSULE | ORAL | Status: DC

## 2013-04-13 NOTE — Telephone Encounter (Signed)
Patient called and requested a refill for temazepam (RESTORIL) 15 MG capsule   Pharmacy [facility] [place], [place]

## 2013-04-13 NOTE — Telephone Encounter (Signed)
Last seen 03/06/13 and filled 04/10/13 #60. Please advise     KP

## 2013-04-14 ENCOUNTER — Telehealth: Payer: Self-pay | Admitting: Family Medicine

## 2013-04-14 NOTE — Telephone Encounter (Signed)
Relevant patient education assigned to patient using Emmi. ° °

## 2013-04-15 ENCOUNTER — Other Ambulatory Visit: Payer: Self-pay | Admitting: *Deleted

## 2013-04-15 MED ORDER — TEMAZEPAM 15 MG PO CAPS: ORAL_CAPSULE | ORAL | Status: DC

## 2013-04-22 ENCOUNTER — Ambulatory Visit (INDEPENDENT_AMBULATORY_CARE_PROVIDER_SITE_OTHER): Payer: Medicare Other | Admitting: Pulmonary Disease

## 2013-04-22 ENCOUNTER — Encounter: Payer: Self-pay | Admitting: Pulmonary Disease

## 2013-04-22 VITALS — BP 94/62 | HR 96 | Ht 60.0 in | Wt 99.0 lb

## 2013-04-22 DIAGNOSIS — J9611 Chronic respiratory failure with hypoxia: Secondary | ICD-10-CM

## 2013-04-22 DIAGNOSIS — R0902 Hypoxemia: Secondary | ICD-10-CM

## 2013-04-22 DIAGNOSIS — J439 Emphysema, unspecified: Secondary | ICD-10-CM

## 2013-04-22 DIAGNOSIS — J9610 Chronic respiratory failure, unspecified whether with hypoxia or hypercapnia: Secondary | ICD-10-CM

## 2013-04-22 DIAGNOSIS — J438 Other emphysema: Secondary | ICD-10-CM

## 2013-04-22 DIAGNOSIS — J189 Pneumonia, unspecified organism: Secondary | ICD-10-CM

## 2013-04-22 MED ORDER — AZITHROMYCIN 250 MG PO TABS: ORAL_TABLET | ORAL | Status: DC

## 2013-04-22 NOTE — Assessment & Plan Note (Signed)
Continue spiriva and prn albuterol.

## 2013-04-22 NOTE — Assessment & Plan Note (Signed)
Continue oxygen 24/7.

## 2013-04-22 NOTE — Patient Instructions (Signed)
Zithromax 250 mg pill >> 2 pills daily on day 1, then 1 pill daily for 4 days Call if not feeling better Follow up in 6 months

## 2013-04-22 NOTE — Assessment & Plan Note (Signed)
Related to chronic aspiration.  She was recently treated with amoxicillin, but has persistent symptoms.  Will give course of zithromax.  Defer chest xray for now >> will do CXR if her symptoms do not improve with additional antibiotics.

## 2013-04-22 NOTE — Progress Notes (Signed)
Chief Complaint  Patient presents with  . COPD    Breathing has been doing well. Reports coughing. Denies SOB, chest tightness or wheezing.    History of Present Illness: [name] is a 68 y.o. female with history of COPD/emphysema, chronic hypoxemic respiratory failure, and recurrent pneumonia with focal areas of bronchiectasis.  I last saw her in February 2014.    She developed a cough again about 3 weeks ago.  She is bringing up sputum, but does not look at the phlegm.  She has notice low grade temperature.  She denies chest pain.  She gets occasional wheeze.  She still has reflux, but this has been okay recently.  She was treated with amoxicillin and cough medicine last month, but her symptoms have persisted.  She uses spiriva daily.  She does not use albuterol much.  She uses her oxygen 24/7.   TESTS: 06/28/08 PFT >> FEV1 1.95 (97%), FEV1% 70, TLC 3.88 (87%), DLCO 64% ONO 02/11/10 >> Test time 9 hrs 9 min, Mean SpO2 87%, low SpO2 80%, Spent 5hrs 57 min (65%) with SpO2 < 88%. 10/10/11 PFT >> FEV1 1.15 (59%), FEV1% 64, TLC 3.26 (73%), DLCO 35%, + BD CT chest 04/30/2011 >> Chronic multifocal nodular and patchy airspace densities throughout the right lung with mild BTX.  She  has a past medical history of GERD (gastroesophageal reflux disease); CAD (coronary artery disease); Venous embolism and thrombosis of subclavian vein; Pleural effusion; Small bowel obstruction; Urinary incontinence; RLS (restless legs syndrome); Primary dilated cardiomyopathy; Vitamin B12 deficiency; Gastroparesis; Chronic nausea; Chronic abdominal pain; Chronic pain syndrome; Pacemaker; H/O: GI bleed; Oxygen dependent; CHF (congestive heart failure); Anemia; stress fx (aug 2011); Elbow fracture, left (aug 2012); Arthritis; gastric ulcer; Diverticulitis; HTN (hypertension); MRSA infection; Pneumonia (- 03/09, 12/09, 02/10, 06/10); Elevated liver enzymes; Blood transfusion; Anginal pain; Shortness of breath;  Fibromyalgia; LBBB (left bundle branch block); Atrial fibrillation; COPD (chronic obstructive pulmonary disease); and Hepatitis.  She  has past surgical history that includes Appendectomy; pacemaker placement (12/21/2009); Peg removed; Tonsillectomy (age 22); Interstim implant revision (03/06/2011); Portacath placement (12/16/2009); Total abdominal hysterectomy; Interstim Implant placement (05/28/2006 - stage I); Interstim implant revision (10/23/2007); Cystoscopy with injection (04/30/2006); Small intestine surgery (05/20/2001); Cardiac catheterization (04/08/2006, 11/16/2009); Cystoscopy (11/11/2008); Cystoscopy w/ retrogrades (11/11/2008); Colectomy; Colectomy (02/04/2000); Gastrocutaneous fistula closure; Exploratory laparotomy (04/27/2009); Port-a-cath removal (07/27/2011); Total hip arthroplasty (08/03/2011); Interstim Implant placement (02/05/2012); Esophagogastroduodenoscopy (N/A, 02/02/2013); and Savory dilation (N/A, 02/02/2013).   Outpatient Encounter Prescriptions as of 04/22/2013  Medication Sig  . albuterol (PROAIR HFA) 108 (90 BASE) MCG/ACT inhaler Inhale 2 puffs into the lungs every 6 (six) hours as needed for wheezing.  [name] azelastine (OPTIVAR) 0.05 % ophthalmic solution INSTILL 1 DROP INTO BOTH EYES TWICE DAILY  . carvedilol (COREG) 6.25 MG tablet TAKE ONE TABLET BY MOUTH TWICE DAILY WITH MEALS  . cyanocobalamin (,VITAMIN B-12,) 1000 MCG/ML injection Inject 1 mL (1,000 mcg total) into the muscle once. Labs are due now  . cyclobenzaprine (FLEXERIL) 10 MG tablet TAKE ONE TABLET BY MOUTH THREE TIMES DAILY AS NEEDED FOR MUSCLE SPASM  . fentaNYL (DURAGESIC - DOSED MCG/HR) 50 MCG/HR Place 1 patch (50 mcg total) onto the skin every 3 (three) days.  . furosemide (LASIX) 20 MG tablet 40 mg.   . gabapentin (NEURONTIN) 600 MG tablet TAKE ONE TABLET BY MOUTH 4 TIMES DAILY  . loperamide (IMODIUM A-D) 2 MG tablet Take 1/2 tablet by mouth daily as needed for diarrhea.  [name] MYRBETRIQ 25 MG TB24 tablet Take  1 tablet by  mouth daily.  . nitrofurantoin (MACRODANTIN) 100 MG capsule Take 1 capsule by mouth daily.  . nitroGLYCERIN (NITROSTAT) 0.4 MG SL tablet Place 0.4 mg under the tongue every 5 (five) minutes as needed. CHEST PAIN  . oxyCODONE-acetaminophen (PERCOCET) 10-325 MG per tablet Take 1 tablet by mouth every 4 (four) hours as needed. PAIN  . pantoprazole (PROTONIX) 40 MG tablet TAKE ONE TABLET BY MOUTH ONCE DAILY  . promethazine (PHENERGAN) 25 MG tablet Take 1 tablet (25 mg total) by mouth every 8 (eight) hours as needed for nausea.  . promethazine-dextromethorphan (PROMETHAZINE-DM) 6.25-15 MG/5ML syrup Take 5 mLs by mouth 4 (four) times daily as needed.  . temazepam (RESTORIL) 15 MG capsule TAKE ONE TO TWO CAPSULES BY MOUTH EVERY NIGHT AT BEDTIME AS NEEDED FOR SLEEP  . tiotropium (SPIRIVA HANDIHALER) 18 MCG inhalation capsule Place 1 capsule (18 mcg total) into inhaler and inhale every morning.  . [DISCONTINUED] amoxicillin (AMOXIL) 500 MG capsule Take 1 capsule (500 mg total) by mouth 2 (two) times daily.  . [DISCONTINUED] denosumab (PROLIA) 60 MG/ML SOLN Inject 60 mg into the skin every 6 (six) months. Twice a year    Allergies  Allergen Reactions  . Dabigatran Etexilate Mesylate Other (See Comments)    INTERNAL BLEEDING-pradaxa  . Talwin [Pentazocine] Other (See Comments)    hallucinations  . Pentazocine Lactate Other (See Comments)    HALLUCINATIONS  . Sulfonamide Derivatives Other (See Comments)    JITTERINESS     Physical Exam:  General - No distress, wearing oxygen ENT - No sinus tenderness, no oral exudate, no LAN Cardiac - s1s2 regular, no murmur Chest - Prolonged exhalation, decreased breath sounds, no wheeze Back - No focal tenderness Abd - Soft, non-tender Ext - No edema Neuro - Normal strength Skin - No rashes Psych - normal mood, and behavior   Assessment/Plan:  Chesley Mires, MD [facility] Pager:  [phone] 04/22/2013, 3:12 PM

## 2013-04-28 ENCOUNTER — Encounter: Payer: Self-pay | Admitting: *Deleted

## 2013-04-28 ENCOUNTER — Telehealth: Payer: Self-pay | Admitting: *Deleted

## 2013-04-28 NOTE — Telephone Encounter (Signed)
Patient called and stated that she needed dr Etter Sjogren nurse to call her insurance company. She would like for [place] to called them and go through her medication list with [employer] and explain to them how many pills she uses each month. Also [employer] is trying to charge her more money for some of her Rx's but we can get them to drop it to a lower tier patient states.    [employer] [phone]

## 2013-04-28 NOTE — Telephone Encounter (Signed)
She can call and have the insurance company fax [place] a tier exception form for the med's she is requesting, I can not be on the phone with the insurance company due to assisting my provider.      KP

## 2013-04-28 NOTE — Telephone Encounter (Signed)
Called patient back and made patient aware that she would need to have her insurance send [place] a tier form to fill out. Patient stated that she understood and would call her insurance company.

## 2013-04-29 ENCOUNTER — Telehealth: Payer: Self-pay | Admitting: *Deleted

## 2013-04-29 NOTE — Telephone Encounter (Signed)
PA paperwork for Temazepam faxed to [facility]. Awaiting response. [id number]

## 2013-05-01 ENCOUNTER — Telehealth: Payer: Self-pay | Admitting: Family Medicine

## 2013-05-01 DIAGNOSIS — R52 Pain, unspecified: Secondary | ICD-10-CM

## 2013-05-01 MED ORDER — OXYCODONE-ACETAMINOPHEN 10-325 MG PO TABS: 1.0000 | ORAL_TABLET | ORAL | Status: DC | PRN

## 2013-05-01 NOTE — Telephone Encounter (Signed)
Tried calling patient but the number was disconnected. My-chart message sent.     KP

## 2013-05-01 NOTE — Telephone Encounter (Signed)
Patient called requesting rx for oxycodone. Call [phone] when ready for pick up.

## 2013-05-06 ENCOUNTER — Telehealth: Payer: Self-pay | Admitting: *Deleted

## 2013-05-06 NOTE — Telephone Encounter (Signed)
Patient dropped off Physicians Consent Form sent to Julienne Kass (RN)

## 2013-05-07 NOTE — Telephone Encounter (Signed)
Form completed, patient notified it is ready to be picked up.

## 2013-05-13 ENCOUNTER — Other Ambulatory Visit: Payer: Self-pay | Admitting: Family Medicine

## 2013-06-04 ENCOUNTER — Telehealth: Payer: Self-pay | Admitting: *Deleted

## 2013-06-04 NOTE — Telephone Encounter (Signed)
Message copied by Hulan Saas on Thu Jun 04, 2013  9:39 AM ------      Message from: Hulan Saas      Created: Fri Mar 06, 2013  2:12 PM       Call and remind patient due for CBC on 06/08/13 for DB ------

## 2013-06-04 NOTE — Telephone Encounter (Signed)
Patient will come for labs.  

## 2013-06-08 ENCOUNTER — Telehealth: Payer: Self-pay | Admitting: Family Medicine

## 2013-06-08 DIAGNOSIS — R52 Pain, unspecified: Secondary | ICD-10-CM

## 2013-06-08 NOTE — Telephone Encounter (Signed)
Patient called requested a refill for fentaNYL (DURAGESIC - DOSED MCG/HR) 50 MCG/HR and oxyCODONE-acetaminophen (PERCOCET) 10-325 MG per tablet

## 2013-06-08 NOTE — Telephone Encounter (Signed)
Last OV: 04/10/13 Fentanyl #10 patches, 0 refills Oxycodone #120, 0 refills Contract on file [facility]- 01/09/13- low risk.  Please advise.

## 2013-06-09 ENCOUNTER — Other Ambulatory Visit: Payer: Self-pay | Admitting: Cardiology

## 2013-06-09 ENCOUNTER — Ambulatory Visit (INDEPENDENT_AMBULATORY_CARE_PROVIDER_SITE_OTHER): Payer: Medicare Other | Admitting: Internal Medicine

## 2013-06-09 ENCOUNTER — Encounter: Payer: Self-pay | Admitting: Internal Medicine

## 2013-06-09 VITALS — BP 90/60 | HR 60 | Ht 60.0 in | Wt 98.6 lb

## 2013-06-09 DIAGNOSIS — E538 Deficiency of other specified B group vitamins: Secondary | ICD-10-CM

## 2013-06-09 DIAGNOSIS — D509 Iron deficiency anemia, unspecified: Secondary | ICD-10-CM

## 2013-06-09 DIAGNOSIS — K56609 Unspecified intestinal obstruction, unspecified as to partial versus complete obstruction: Secondary | ICD-10-CM

## 2013-06-09 DIAGNOSIS — R197 Diarrhea, unspecified: Secondary | ICD-10-CM

## 2013-06-09 DIAGNOSIS — K3184 Gastroparesis: Secondary | ICD-10-CM

## 2013-06-09 MED ORDER — OXYCODONE-ACETAMINOPHEN 10-325 MG PO TABS: 1.0000 | ORAL_TABLET | ORAL | Status: DC | PRN

## 2013-06-09 MED ORDER — TEMAZEPAM 15 MG PO CAPS: ORAL_CAPSULE | ORAL | Status: DC

## 2013-06-09 MED ORDER — FENTANYL 50 MCG/HR TD PT72: 50.0000 ug | MEDICATED_PATCH | TRANSDERMAL | Status: DC

## 2013-06-09 NOTE — Telephone Encounter (Signed)
Please advise and refill for this pt- I don't know if she is on both fentanyl and oxycodone b/c the fentanyl hasn't been filled since December.  I don't want to over dose this pt on pain meds.

## 2013-06-09 NOTE — Telephone Encounter (Signed)
Please refill both-- she has been on them for years.  Hx mult abd procedures/ surgery and pain

## 2013-06-09 NOTE — Telephone Encounter (Signed)
Spoke with patient.  Patient stated that she has been taking both medications and has not had any adverse effects.  States that medications have been therapeutic in relieving pain.     Fentanyl and Oxycodone prescriptions filled and printed.  Patient aware.   Patient is also requesting a refill on temazepam 15 mg caps, 1-2 caps by mouth once daily at bedtime as needed for sleep. Last refilled:  05/13/13 #60, 0 refills  Please advise.

## 2013-06-09 NOTE — Telephone Encounter (Signed)
Dr. Etter Sjogren stated to refill temazepam x 1 refill.  When refilling the medication, the computer prompted that the medication was currently not on patient's preferred formulary.  The alternatives listed were Zaleplon (tier 1) and Zolpidem (tier 4).  Patient stated that she was willing to try Zaleplon or whichever one Dr. Etter Sjogren suggested.     Please advise.

## 2013-06-09 NOTE — Telephone Encounter (Signed)
If temazepam is working for her I recommend no change. If is very expensive to the patient and we can try something different. Print  temazepam one-month supply

## 2013-06-09 NOTE — Telephone Encounter (Signed)
Prescription printed.  Awaiting signature.  Patient aware.

## 2013-06-09 NOTE — Patient Instructions (Signed)
Your physician has requested that you go to the basement for the following lab work before leaving today: CBC, CMT, B12, IBC  CC:Dr Lowne

## 2013-06-09 NOTE — Telephone Encounter (Signed)
Pt notified. rx rdy for pick up at front desk.

## 2013-06-09 NOTE — Telephone Encounter (Signed)
I prefer not to change unless pt is requesting it

## 2013-06-09 NOTE — Progress Notes (Signed)
[name] 1945-11-05 [id number]  Note: This dictation was prepared with Dragon digital system. Any transcriptional errors that result from this procedure are unintentional.   History of Present Illness:  This is a 68 year old white female with chronic low-grade small bowel obstruction from adhesive disease. She has a history of multiple abdominal surgeries including subtotal colectomy with 20 cm of the colon left. She had an unsuccessful attempt for lysis of adhesions in March 2011 and has a persistent stricture in the distal ileum which was visualized on a flexible sigmoidoscopy in 2012. She has chronic GI blood loss requiring iron infusions. Her last infusion was in May 06, 2012. She has chronic diarrhea and gastroparesis. She also has a history of gastrocutaneous fistula following the removal of percutaneous gastrostomy. She had an upper GI bleed while on Pradaxa in 2011. She has oxygen-dependent COPD followed by Dr.Sood. She comes today with 2 recent episodes of small bowel obstruction which resolved on bowel rest, phenergan and Percocet. Her father passed away in 05-06-2022 and she had to travel back-and-forth to [place]. Her weight has decreased from 102 pounds in October to a current 98 pounds. She is now  feeling better and has been able to eat solid food.     Past Medical History  Diagnosis Date  . GERD (gastroesophageal reflux disease)   . CAD (coronary artery disease)     mild disease per cath in 2011  . Venous embolism and thrombosis of subclavian vein     after pacemaker insertion Oct 2011  . Pleural effusion      s/p right thoracentesis 03/09  . Small bowel obstruction   . Urinary incontinence      -INTERSTIM IMPLANT NOT FUNCTIONING PER PT  . RLS (restless legs syndrome)   . Primary dilated cardiomyopathy     EF 45 to 50% per echo in Jan 2012  . Vitamin B12 deficiency   . Gastroparesis   . Chronic nausea   . Chronic abdominal pain   . Chronic pain syndrome   .  Pacemaker     CRT therapy; followed by Dr. Caryl Comes  . H/O: GI bleed     from Pradaxa  . Oxygen dependent     2 liters via nasal cannula at all times  . CHF (congestive heart failure)   . Anemia   . Hx of stress fx aug 2011    right hip   . Elbow fracture, left aug 2012  . Arthritis     right hip  . Hx of gastric ulcer   . Diverticulitis   . HTN (hypertension)     under control; has been on med. x "years"  . Hx MRSA infection   . Pneumonia - 03/09, 12/09, 02/10, 06/10    MOST RECENT 05/07/11  . Elevated liver enzymes   . Blood transfusion   . Anginal pain   . Shortness of breath   . Fibromyalgia     COUPLE OF TIMES A YEAR-PT HAS EPISODES OF CONFUSION-USUALLY INVOLVES DAY/NIGHT REVERSAL AND EPISODES OF TWITCHING AND FALLS--SEES DR. Jacelyn Grip - NEUROLOGIST-LAST SEEN 07/10/11  . LBBB (left bundle branch block)   . Atrial fibrillation   . COPD (chronic obstructive pulmonary disease)     continuous O2- 2l  . Hepatitis     hx of in high school   . Silent aspiration     Past Surgical History  Procedure Laterality Date  . Appendectomy    . Pacemaker placement  12/21/2009  . Peg removed  with complications  . Tonsillectomy  age 40  . Interstim implant revision  03/06/2011    Procedure: REVISION OF [name];  Surgeon: Reece Packer, MD;  Location: [facility];  Service: Urology;  Laterality: N/A;  Replacement of Neurostimulator  . Portacath placement  12/16/2009  . Total abdominal hysterectomy      complete  . Interstim implant placement  05/28/2006 - stage I    06/05/2006 - stage II  . Interstim implant revision  10/23/2007  . Cystoscopy with injection  04/30/2006    transurethral collagen injection; incision vaginal stenosis  . Small intestine surgery  05/20/2001    ex. lap., resection of small bowel stricture; gastrostomy; insertion central line  . Cardiac catheterization  04/08/2006, 11/16/2009  . Cystoscopy  11/11/2008  . Cystoscopy w/ retrogrades  11/11/2008    right  . Colectomy       Intestinal resection (5times)  . Colectomy  02/04/2000    ex. lap., intra-abd. subtotal colectomy with ileosigmoid colon anastomosies and lysis of adhesions  . Gastrocutaneous fistula closure    . Exploratory laparotomy  04/27/2009    lysis of adhesions, gastrostomy tube  . Port-a-cath removal  07/27/2011    Procedure: REMOVAL PORT-A-CATH;  Surgeon: Rolm Bookbinder, MD;  Location: [facility];  Service: General;  Laterality: N/A;  . Total hip arthroplasty  08/03/2011    Procedure: TOTAL HIP ARTHROPLASTY ANTERIOR APPROACH;  Surgeon: Mcarthur Rossetti, MD;  Location: [facility];  Service: Orthopedics;  Laterality: Right;  . Interstim implant placement  02/05/2012    Procedure: INTERSTIM IMPLANT FIRST STAGE;  Surgeon: Reece Packer, MD;  Location: [facility];  Service: Urology;  Laterality: N/A;  Replacement of Interstim Lead     . Esophagogastroduodenoscopy N/A 02/02/2013    Procedure: ESOPHAGOGASTRODUODENOSCOPY (EGD);  Surgeon: Lafayette Dragon, MD;  Location: [facility];  Service: Endoscopy;  Laterality: N/A;  . Savory dilation N/A 02/02/2013    Procedure: SAVORY DILATION;  Surgeon: Lafayette Dragon, MD;  Location: [facility];  Service: Endoscopy;  Laterality: N/A;    Allergies  Allergen Reactions  . Dabigatran Etexilate Mesylate Other (See Comments)    INTERNAL BLEEDING-pradaxa  . Talwin [Pentazocine] Other (See Comments)    hallucinations  . Pentazocine Lactate Other (See Comments)    HALLUCINATIONS  . Sulfonamide Derivatives Other (See Comments)    JITTERINESS     Family history and social history have been reviewed.  Review of Systems: Denies dysphagia  The remainder of the 10 point ROS is negative except as outlined in the H&P  Physical Exam: General Appearance  thin chronically ill-appearing, in no distress, with oxygen prongs  Eyes  Non icteric  HEENT  Non traumatic, normocephalic  Mouth No lesion, tongue papillated, no cheilosis Neck Supple  without adenopathy, thyroid not enlarged, no carotid bruits, no JVD Lungs Clear to auscultation bilaterally COR Normal S1, normal S2, regular rhythm, no murmur, quiet precordium Abdomen Multiple surgical scars. Increased bowel sounds. Tenderness to the left of the midline. No palpable mass  Rectal Yellow pasty Hemoccult negative stool  Extremities  No pedal edema Skin No lesions Neurological Alert and oriented x 3 Psychological Normal mood and affect  Assessment and Plan:   Problem #1 Adhesive disease in the abdomen causing chronic low-grade small bowel obstruction. She just recovered from 2 separate episodes which resolved on bowel rest. She will continue to slowly advance the diet. She has Phenergan and pantoprazole at home. I will see her in 3 months. She  will have blood tests next week which include CBC, metabolic panel, H46 levels and iron panel.    Delfin Edis 06/09/2013

## 2013-06-11 ENCOUNTER — Encounter: Payer: Self-pay | Admitting: *Deleted

## 2013-06-11 ENCOUNTER — Telehealth: Payer: Self-pay | Admitting: *Deleted

## 2013-06-11 NOTE — Telephone Encounter (Signed)
Message copied by Hulan Saas on Thu Jun 11, 2013 10:11 AM ------      Message from: Hulan Saas      Created: Thu Jun 04, 2013  9:41 AM       Did patient have labs for DB ------

## 2013-06-11 NOTE — Telephone Encounter (Signed)
Letter mailed to patient reminding her of labs due.

## 2013-06-12 ENCOUNTER — Other Ambulatory Visit: Payer: Self-pay | Admitting: Family Medicine

## 2013-06-15 ENCOUNTER — Other Ambulatory Visit (INDEPENDENT_AMBULATORY_CARE_PROVIDER_SITE_OTHER): Payer: Medicare Other

## 2013-06-15 DIAGNOSIS — E538 Deficiency of other specified B group vitamins: Secondary | ICD-10-CM

## 2013-06-15 DIAGNOSIS — K3184 Gastroparesis: Secondary | ICD-10-CM

## 2013-06-15 DIAGNOSIS — D509 Iron deficiency anemia, unspecified: Secondary | ICD-10-CM

## 2013-06-15 DIAGNOSIS — R197 Diarrhea, unspecified: Secondary | ICD-10-CM

## 2013-06-15 DIAGNOSIS — K56609 Unspecified intestinal obstruction, unspecified as to partial versus complete obstruction: Secondary | ICD-10-CM

## 2013-06-15 LAB — IBC PANEL
Iron: 61 ug/dL (ref 42–145)
Saturation Ratios: 26.2 % (ref 20.0–50.0)
Transferrin: 166 mg/dL — ABNORMAL LOW (ref 212.0–360.0)

## 2013-06-15 LAB — COMPREHENSIVE METABOLIC PANEL
ALT: 9 U/L (ref 0–35)
AST: 16 U/L (ref 0–37)
Albumin: 3.6 g/dL (ref 3.5–5.2)
Alkaline Phosphatase: 61 U/L (ref 39–117)
BUN: 19 mg/dL (ref 6–23)
CO2: 31 mEq/L (ref 19–32)
Calcium: 8.5 mg/dL (ref 8.4–10.5)
Chloride: 100 mEq/L (ref 96–112)
Creatinine, Ser: 0.8 mg/dL (ref 0.4–1.2)
GFR: 71.54 mL/min (ref >60.00)
Glucose, Bld: 80 mg/dL (ref 70–99)
Potassium: 4.1 mEq/L (ref 3.5–5.1)
Sodium: 138 mEq/L (ref 135–145)
Total Bilirubin: 0.4 mg/dL (ref 0.3–1.2)
Total Protein: 6.4 g/dL (ref 6.0–8.3)

## 2013-06-15 LAB — VITAMIN B12: Vitamin B-12: 419 pg/mL (ref 211–911)

## 2013-06-16 ENCOUNTER — Other Ambulatory Visit: Payer: Self-pay | Admitting: *Deleted

## 2013-06-16 DIAGNOSIS — E538 Deficiency of other specified B group vitamins: Secondary | ICD-10-CM

## 2013-06-16 DIAGNOSIS — D649 Anemia, unspecified: Secondary | ICD-10-CM

## 2013-06-16 LAB — CBC WITH DIFFERENTIAL/PLATELET
Basophils Absolute: 0 10*3/uL (ref 0.0–0.1)
Basophils Relative: 0.4 % (ref 0.0–3.0)
Eosinophils Absolute: 0.1 10*3/uL (ref 0.0–0.7)
Eosinophils Relative: 2.2 % (ref 0.0–5.0)
HCT: 32.3 % — ABNORMAL LOW (ref 36.0–46.0)
Hemoglobin: 10.5 g/dL — ABNORMAL LOW (ref 12.0–15.0)
Lymphocytes Relative: 15.5 % (ref 12.0–46.0)
Lymphs Abs: 0.7 10*3/uL (ref 0.7–4.0)
MCHC: 32.6 g/dL (ref 30.0–36.0)
MCV: 88.8 fl (ref 78.0–100.0)
Monocytes Absolute: 0.4 10*3/uL (ref 0.1–1.0)
Monocytes Relative: 8.7 % (ref 3.0–12.0)
Neutro Abs: 3.5 10*3/uL (ref 1.4–7.7)
Neutrophils Relative %: 73.2 % (ref 43.0–77.0)
Platelets: 242 10*3/uL (ref 150.0–400.0)
RBC: 3.64 Mil/uL — ABNORMAL LOW (ref 3.87–5.11)
RDW: 13.9 % (ref 11.5–14.6)
WBC: 4.7 10*3/uL (ref 4.5–10.5)

## 2013-06-22 ENCOUNTER — Telehealth: Payer: Self-pay | Admitting: Internal Medicine

## 2013-06-22 NOTE — Telephone Encounter (Signed)
Patient advised that her recent b12 levels are within normal range on supplementation, therefore we would have her to continue supplementation. She verbalizes understanding.

## 2013-06-23 ENCOUNTER — Ambulatory Visit (INDEPENDENT_AMBULATORY_CARE_PROVIDER_SITE_OTHER): Payer: Medicare Other

## 2013-06-23 DIAGNOSIS — E538 Deficiency of other specified B group vitamins: Secondary | ICD-10-CM

## 2013-06-23 MED ORDER — CYANOCOBALAMIN 1000 MCG/ML IJ SOLN
1000.0000 ug | Freq: Once | INTRAMUSCULAR | Status: AC
  Administered 2013-06-23: 1000 ug via INTRAMUSCULAR

## 2013-07-04 ENCOUNTER — Other Ambulatory Visit: Payer: Self-pay | Admitting: Cardiology

## 2013-07-07 ENCOUNTER — Encounter: Payer: Self-pay | Admitting: Family Medicine

## 2013-07-07 ENCOUNTER — Ambulatory Visit (INDEPENDENT_AMBULATORY_CARE_PROVIDER_SITE_OTHER): Payer: Medicare Other | Admitting: Family Medicine

## 2013-07-07 VITALS — BP 118/70 | HR 82 | Temp 97.9°F | Wt 104.4 lb

## 2013-07-07 DIAGNOSIS — R21 Rash and other nonspecific skin eruption: Secondary | ICD-10-CM

## 2013-07-07 DIAGNOSIS — R52 Pain, unspecified: Secondary | ICD-10-CM

## 2013-07-07 MED ORDER — OXYCODONE-ACETAMINOPHEN 10-325 MG PO TABS: 1.0000 | ORAL_TABLET | ORAL | Status: DC | PRN

## 2013-07-07 MED ORDER — PREDNISONE 10 MG PO TABS: ORAL_TABLET | ORAL | Status: DC

## 2013-07-07 NOTE — Patient Instructions (Signed)
Rash  A rash is a change in the color or texture of your skin. There are many different types of rashes. You may have other problems that accompany your rash.  CAUSES   · Infections.  · Allergic reactions. This can include allergies to pets or foods.  · Certain medicines.  · Exposure to certain chemicals, soaps, or cosmetics.  · Heat.  · Exposure to poisonous plants.  · Tumors, both cancerous and noncancerous.  SYMPTOMS   · Redness.  · Scaly skin.  · Itchy skin.  · Dry or cracked skin.  · Bumps.  · Blisters.  · Pain.  DIAGNOSIS   Your caregiver may do a physical exam to determine what type of rash you have. A skin sample (biopsy) may be taken and examined under a microscope.  TREATMENT   Treatment depends on the type of rash you have. Your caregiver may prescribe certain medicines. For serious conditions, you may need to see a skin doctor (dermatologist).  HOME CARE INSTRUCTIONS   · Avoid the substance that caused your rash.  · Do not scratch your rash. This can cause infection.  · You may take cool baths to help stop itching.  · Only take over-the-counter or prescription medicines as directed by your caregiver.  · Keep all follow-up appointments as directed by your caregiver.  SEEK IMMEDIATE MEDICAL CARE IF:  · You have increasing pain, swelling, or redness.  · You have a fever.  · You have new or severe symptoms.  · You have body aches, diarrhea, or vomiting.  · Your rash is not better after 3 days.  MAKE SURE YOU:  · Understand these instructions.  · Will watch your condition.  · Will get help right away if you are not doing well or get worse.  Document Released: 02/23/2002 Document Revised: 05/28/2011 Document Reviewed: 12/18/2010  ExitCare® Patient Information ©2014 [employer].

## 2013-07-07 NOTE — Progress Notes (Signed)
   Subjective:    Patient ID: [name], female    DOB: [birth year], 68 y.o.   MRN: [id number]  HPI Pt here c/o rash on legs and arms x few weeks.  Occasionally itching.  Otherwise pt not in any distress.    Review of Systems As above    Objective:   Physical Exam  BP 118/70  Pulse 82  Temp(Src) 97.9 F (36.6 C)  Wt 104 lb 6.4 oz (47.356 kg)  SpO2 98% General appearance: alert, cooperative, appears stated age and no distress Nose: Nares normal. Septum midline. Mucosa normal. No drainage or sinus tenderness. Throat: lips, mucosa, and tongue normal; teeth and gums normal Neck: no adenopathy and thyroid not enlarged, symmetric, no tenderness/mass/nodules Lungs: clear to auscultation bilaterally Heart: S1, S2 normal Extremities: extremities normal, atraumatic, no cyanosis or edema Skin: macular - arm(s) bilateral, lower leg(s) bilateral---errythematous patches      Assessment & Plan:  1. Pain Refill meds - oxyCODONE-acetaminophen (PERCOCET) 10-325 MG per tablet; Take 1 tablet by mouth every 4 (four) hours as needed. PAIN  Dispense: 120 tablet; Refill: 0  2. Rash and nonspecific skin eruption pred taper--- derm consult if no better - predniSONE (DELTASONE) 10 MG tablet; 3 po qd for 3 days then 2 po qd for 3 days the 1 po qd for 3 days  Dispense: 18 tablet; Refill: 0

## 2013-07-07 NOTE — Progress Notes (Signed)
Pre visit review using our clinic review tool, if applicable. No additional management support is needed unless otherwise documented below in the visit note. 

## 2013-07-08 ENCOUNTER — Other Ambulatory Visit: Payer: Self-pay | Admitting: Cardiology

## 2013-07-09 ENCOUNTER — Other Ambulatory Visit: Payer: Self-pay | Admitting: Family Medicine

## 2013-07-09 ENCOUNTER — Encounter (INDEPENDENT_AMBULATORY_CARE_PROVIDER_SITE_OTHER): Payer: Self-pay | Admitting: General Surgery

## 2013-07-09 ENCOUNTER — Ambulatory Visit (INDEPENDENT_AMBULATORY_CARE_PROVIDER_SITE_OTHER): Payer: Medicare Other | Admitting: General Surgery

## 2013-07-09 VITALS — BP 126/72 | HR 86 | Temp 97.7°F | Resp 16 | Ht 60.0 in | Wt 105.6 lb

## 2013-07-09 DIAGNOSIS — R109 Unspecified abdominal pain: Secondary | ICD-10-CM

## 2013-07-09 NOTE — Progress Notes (Signed)
Subjective:     Patient ID: [name], female   DOB: [birth year], 68 y.o.   MRN: [id number]  HPI Patient has had multiple previous surgeries by Dr. Donne Hazel. She comes surgeon office complaining of some pain at a drain site on her left upper quadrant. She's had no drainage. She has chronic nausea alternating with diarrhea and is followed closely by Dr. Maurene Capes from GI.  Review of Systems     Objective:   Physical Exam  Constitutional: She appears well-developed.  HENT:  Head: Normocephalic.  Cardiovascular: Normal rate and normal heart sounds.   Pulmonary/Chest: Effort normal.  Wears oxygen  Abdominal: Soft. She exhibits no distension.    Center of left upper quadrant transverse incision has some palpable scar tissue which is mildly tender, no fascial defect or hernia is felt, chronic skin discoloration without cellulitis       Assessment:     Painful scar tissue left abdomen, no hernia felt    Plan:     Return for reevaluation by Dr. Donne Hazel in a month or so

## 2013-07-09 NOTE — Telephone Encounter (Signed)
Last seen 07/07/13 and filled 06/09/13 #60 UDS 01/09/13 Low risk   Please advise     KP

## 2013-07-11 ENCOUNTER — Other Ambulatory Visit: Payer: Self-pay | Admitting: Internal Medicine

## 2013-07-11 ENCOUNTER — Other Ambulatory Visit: Payer: Self-pay | Admitting: Family Medicine

## 2013-07-12 ENCOUNTER — Other Ambulatory Visit: Payer: Self-pay | Admitting: Family Medicine

## 2013-07-14 NOTE — Telephone Encounter (Signed)
Rx sent on 07/09/13.     KP

## 2013-07-20 ENCOUNTER — Ambulatory Visit (INDEPENDENT_AMBULATORY_CARE_PROVIDER_SITE_OTHER): Payer: Medicare Other | Admitting: Family Medicine

## 2013-07-20 ENCOUNTER — Encounter: Payer: Self-pay | Admitting: Family Medicine

## 2013-07-20 VITALS — BP 122/60 | HR 89 | Temp 98.0°F | Ht 60.0 in | Wt 101.6 lb

## 2013-07-20 DIAGNOSIS — T81321A Disruption or dehiscence of closure of internal operation (surgical) wound of abdominal wall muscle or fascia, initial encounter: Secondary | ICD-10-CM

## 2013-07-20 DIAGNOSIS — Z1231 Encounter for screening mammogram for malignant neoplasm of breast: Secondary | ICD-10-CM

## 2013-07-20 DIAGNOSIS — E785 Hyperlipidemia, unspecified: Secondary | ICD-10-CM

## 2013-07-20 DIAGNOSIS — Z0000 Encounter for general adult medical examination without abnormal findings: Secondary | ICD-10-CM

## 2013-07-20 DIAGNOSIS — I10 Essential (primary) hypertension: Secondary | ICD-10-CM

## 2013-07-20 DIAGNOSIS — T8130XA Disruption of wound, unspecified, initial encounter: Secondary | ICD-10-CM

## 2013-07-20 MED ORDER — CEPHALEXIN 500 MG PO CAPS: 500.0000 mg | ORAL_CAPSULE | Freq: Two times a day (BID) | ORAL | Status: DC

## 2013-07-20 NOTE — Patient Instructions (Signed)

## 2013-07-20 NOTE — Progress Notes (Signed)
Pre visit review using our clinic review tool, if applicable. No additional management support is needed unless otherwise documented below in the visit note. 

## 2013-07-20 NOTE — Progress Notes (Signed)
Subjective:    [name] is a 68 y.o. female who presents for Medicare Annual/Subsequent preventive examination.  Preventive Screening-Counseling & Management  Tobacco History  Smoking status  . Former Smoker -- 1.00 packs/day for 20 years  . Quit date: 03/19/1986  Smokeless tobacco  . Never Used     Problems Prior to Visit 1. Nothing new  Current Problems (verified) Patient Active Problem List   Diagnosis Date Noted  . Abdominal wall pain 07/09/2013  . COPD exacerbation 04/10/2013  . Other dysphagia 02/02/2013  . Nonspecific (abnormal) findings on radiological and other examination of gastrointestinal tract 02/02/2013  . Platelets decreased 01/29/2012  . Vitamin D deficiency 12/13/2011  . Hypokalemia 12/13/2011  . Hypocalcemia 12/12/2011  . Adhesions of intestine, very dense, with frozen abdomen 12/11/2011  . SBO (small bowel obstruction), recurrent 12/11/2011  . Protein-calorie malnutrition, moderate 12/11/2011  . Oxygen dependent   . Hx of gastric ulcer   . UTI (urinary tract infection) 10/15/2011  . Allergic conjunctivitis 10/15/2011  . Degenerative arthritis of hip 08/03/2011  . B12 deficiency 07/02/2011  . Bilateral leg weakness 01/16/2011  . Anemia 09/14/2010  . Recurrent pneumonia 08/11/2010  . CRT- [employer] 06/26/2010  . DYSURIA 05/19/2010  . Chronic hypoxemic respiratory failure 02/21/2010  . DEHYDRATION 01/06/2010  . GASTROENTERITIS 01/06/2010  . CARDIOMYOPATHY, PRIMARY, DILATED 11/17/2009  . LBBB 11/17/2009  . SYSTOLIC HEART FAILURE, CHRONIC 11/17/2009  . GERD 02/17/2009  . NONSPECIFIC ABN FINDNG RAD&OTH EXAM BILARY TRCT 09/03/2008  . COPD with emphysema 06/28/2008  . DIVERTICULOSIS, COLON 06/03/2008  . GASTRITIS 06/02/2008  . Atrial fibrillation/flutter 12/08/2007  . GASTROPARESIS 08/07/2007  . NAUSEA, CHRONIC 08/07/2007  . ABDOMINAL PAIN, CHRONIC 08/07/2007  . OSTEOPOROSIS 02/17/2007  . SYNCOPE 02/17/2007  . RESTLESS LEG SYNDROME  08/01/2006  . Chronic pain syndrome 08/01/2006  . OSTEOARTHRITIS 07/31/2006  . [place] DISEASE 07/31/2006  . FIBROMYALGIA 07/31/2006  . CHRONIC FATIGUE SYNDROME 07/31/2006  . URINARY INCONTINENCE 07/31/2006  . ANEMIA-IRON DEFICIENCY 05/01/2006  . HYPERTENSION 05/01/2006    Medications Prior to Visit Current Outpatient Prescriptions on File Prior to Visit  Medication Sig Dispense Refill  . albuterol (PROAIR HFA) 108 (90 BASE) MCG/ACT inhaler Inhale 2 puffs into the lungs every 6 (six) hours as needed for wheezing.  1 Inhaler  5  . azelastine (OPTIVAR) 0.05 % ophthalmic solution INSTILL 1 DROP INTO BOTH EYES TWICE DAILY  6 mL  11  . carvedilol (COREG) 6.25 MG tablet TAKE ONE TABLET BY MOUTH TWICE DAILY WITH MEALS  60 tablet  5  . cyanocobalamin (,VITAMIN B-12,) 1000 MCG/ML injection Inject 1 mL (1,000 mcg total) into the muscle once. Labs are due now  4 mL  0  . cyclobenzaprine (FLEXERIL) 10 MG tablet TAKE ONE TABLET BY MOUTH THREE TIMES DAILY AS NEEDED FOR MUSCLE SPASM  60 tablet  0  . furosemide (LASIX) 40 MG tablet TAKE ONE TABLET BY MOUTH IN THE MORNING  30 tablet  0  . gabapentin (NEURONTIN) 600 MG tablet TAKE ONE TABLET BY MOUTH 4 TIMES DAILY  120 tablet  2  . loperamide (IMODIUM A-D) 2 MG tablet Take 1/2 tablet by mouth daily as needed for diarrhea.  30 tablet  2  . MYRBETRIQ 25 MG TB24 tablet Take 1 tablet by mouth daily.      . nitroGLYCERIN (NITROSTAT) 0.4 MG SL tablet Place 0.4 mg under the tongue every 5 (five) minutes as needed. CHEST PAIN      . oxyCODONE-acetaminophen (PERCOCET) 10-325 MG  per tablet Take 1 tablet by mouth every 4 (four) hours as needed. PAIN  120 tablet  0  . pantoprazole (PROTONIX) 40 MG tablet TAKE ONE TABLET BY MOUTH ONCE DAILY  30 tablet  2  . promethazine (PHENERGAN) 25 MG tablet Take 1 tablet (25 mg total) by mouth every 8 (eight) hours as needed for nausea.  40 tablet  0  . temazepam (RESTORIL) 15 MG capsule TAKE ONE TO TWO CAPSULES BY MOUTH  AT BEDTIME AS NEEDED FOR  SLEEP  60 capsule  0  . tiotropium (SPIRIVA HANDIHALER) 18 MCG inhalation capsule Place 1 capsule (18 mcg total) into inhaler and inhale every morning.  30 capsule  5  . [DISCONTINUED] iron dextran complex (INFED) 50 MG/ML injection Please give infed infusion (no test dose needed) over 4 hours per pharmacy calculated dose. Ht: 5'2 Wt: 99 pounds Hgb: 12  1 mL  0   No current facility-administered medications on file prior to visit.    Current Medications (verified) Current Outpatient Prescriptions  Medication Sig Dispense Refill  . albuterol (PROAIR HFA) 108 (90 BASE) MCG/ACT inhaler Inhale 2 puffs into the lungs every 6 (six) hours as needed for wheezing.  1 Inhaler  5  . azelastine (OPTIVAR) 0.05 % ophthalmic solution INSTILL 1 DROP INTO BOTH EYES TWICE DAILY  6 mL  11  . carvedilol (COREG) 6.25 MG tablet TAKE ONE TABLET BY MOUTH TWICE DAILY WITH MEALS  60 tablet  5  . cyanocobalamin (,VITAMIN B-12,) 1000 MCG/ML injection Inject 1 mL (1,000 mcg total) into the muscle once. Labs are due now  4 mL  0  . cyclobenzaprine (FLEXERIL) 10 MG tablet TAKE ONE TABLET BY MOUTH THREE TIMES DAILY AS NEEDED FOR MUSCLE SPASM  60 tablet  0  . furosemide (LASIX) 40 MG tablet TAKE ONE TABLET BY MOUTH IN THE MORNING  30 tablet  0  . gabapentin (NEURONTIN) 600 MG tablet TAKE ONE TABLET BY MOUTH 4 TIMES DAILY  120 tablet  2  . loperamide (IMODIUM A-D) 2 MG tablet Take 1/2 tablet by mouth daily as needed for diarrhea.  30 tablet  2  . MYRBETRIQ 25 MG TB24 tablet Take 1 tablet by mouth daily.      . nitroGLYCERIN (NITROSTAT) 0.4 MG SL tablet Place 0.4 mg under the tongue every 5 (five) minutes as needed. CHEST PAIN      . oxyCODONE-acetaminophen (PERCOCET) 10-325 MG per tablet Take 1 tablet by mouth every 4 (four) hours as needed. PAIN  120 tablet  0  . pantoprazole (PROTONIX) 40 MG tablet TAKE ONE TABLET BY MOUTH ONCE DAILY  30 tablet  2  . promethazine (PHENERGAN) 25 MG tablet Take 1  tablet (25 mg total) by mouth every 8 (eight) hours as needed for nausea.  40 tablet  0  . temazepam (RESTORIL) 15 MG capsule TAKE ONE TO TWO CAPSULES BY MOUTH AT BEDTIME AS NEEDED FOR  SLEEP  60 capsule  0  . tiotropium (SPIRIVA HANDIHALER) 18 MCG inhalation capsule Place 1 capsule (18 mcg total) into inhaler and inhale every morning.  30 capsule  5  . [DISCONTINUED] iron dextran complex (INFED) 50 MG/ML injection Please give infed infusion (no test dose needed) over 4 hours per pharmacy calculated dose. Ht: 5'2 Wt: 99 pounds Hgb: 12  1 mL  0   No current facility-administered medications for this visit.     Allergies (verified) Dabigatran etexilate mesylate; Talwin; Pentazocine lactate; and Sulfonamide derivatives   PAST HISTORY  Family History Family History  Problem Relation Age of Onset  . Heart disease Father   . Stroke Mother   . Heart disease Mother   . Colon cancer Neg Hx   . Heart disease Brother   . Breast cancer Maternal Aunt   . Heart disease Paternal Aunt   . Diabetes Paternal Grandfather     Social History History  Substance Use Topics  . Smoking status: Former Smoker -- 1.00 packs/day for 20 years    Quit date: 03/19/1986  . Smokeless tobacco: Never Used  . Alcohol Use: No     Are there smokers in your home (other than you)? No  Risk Factors Current exercise habits: silver sneakers  Dietary issues discussed: na   Cardiac risk factors: advanced age (older than 8 for men, 90 for women), dyslipidemia and hypertension.  Depression Screen (Note: if answer to either of the following is "Yes", a more complete depression screening is indicated)   Over the past two weeks, have you felt down, depressed or hopeless? No  Over the past two weeks, have you felt little interest or pleasure in doing things? No  Have you lost interest or pleasure in daily life? No  Do you often feel hopeless? No  Do you cry easily over simple problems? No  Activities of Daily  Living In your present state of health, do you have any difficulty performing the following activities?:  Driving? No Managing money?  No Feeding yourself? No Getting from bed to chair? No Climbing a flight of stairs? No Preparing food and eating?: No Bathing or showering? No Getting dressed: No Getting to the toilet? No Using the toilet:No Moving around from place to place: No In the past year have you fallen or had a near fall?:No   Are you sexually active?  Yes  Do you have more than one partner?  No  Hearing Difficulties: No Do you often ask people to speak up or repeat themselves? No Do you experience ringing or noises in your ears? No Do you have difficulty understanding soft or whispered voices? No   Do you feel that you have a problem with memory? No  Do you often misplace items? No  Do you feel safe at home?  Yes  Cognitive Testing  Alert? Yes  Normal Appearance?Yes  Oriented to person? Yes  Place? Yes   Time? Yes  Recall of three objects?  Yes  Can perform simple calculations? Yes  Displays appropriate judgment?Yes  Can read the correct time from a watch face?Yes   Advanced Directives have been discussed with the patient? Yes  List the Names of Other Physician/Practitioners you currently use: 1.    Indicate any recent Medical Services you may have received from other than [facility] providers in the past year (date may be approximate).  Immunization History  Administered Date(s) Administered  . Influenza Split 01/16/2011, 12/12/2011  . Influenza Whole 01/15/2007, 12/08/2007, 12/28/2008, 12/17/2009  . Influenza, Seasonal, Injecte, Preservative Fre 12/09/2012  . Pneumococcal Polysaccharide-23 12/17/2009    Screening Tests Health Maintenance  Topic Date Due  . Tetanus/tdap  11/10/1963  . Mammogram  11/10/1994  . Zostavax  11/09/2004  . Colonoscopy  12/12/2008  . Influenza Vaccine  10/17/2013  . Pneumococcal Polysaccharide Vaccine Age 4 And Over  Completed     All answers were reviewed with the patient and necessary referrals were made:  Garnet Koyanagi, DO   07/20/2013   History reviewed:  She  has a past medical history  of GERD (gastroesophageal reflux disease); CAD (coronary artery disease); Venous embolism and thrombosis of subclavian vein; Pleural effusion; Small bowel obstruction; Urinary incontinence; RLS (restless legs syndrome); Primary dilated cardiomyopathy; Vitamin B12 deficiency; Gastroparesis; Chronic nausea; Chronic abdominal pain; Chronic pain syndrome; Pacemaker; H/O: GI bleed; Oxygen dependent; CHF (congestive heart failure); Anemia; stress fx (aug 2011); Elbow fracture, left (aug 2012); Arthritis; gastric ulcer; Diverticulitis; HTN (hypertension); MRSA infection; Pneumonia (- 03/09, 12/09, 02/10, 06/10); Elevated liver enzymes; Blood transfusion; Anginal pain; Shortness of breath; Fibromyalgia; LBBB (left bundle branch block); Atrial fibrillation; COPD (chronic obstructive pulmonary disease); Hepatitis; and Silent aspiration. She  does not have any pertinent problems on file. She  has past surgical history that includes Appendectomy; pacemaker placement (12/21/2009); Peg removed; Tonsillectomy (age 4); Interstim implant revision (03/06/2011); Portacath placement (12/16/2009); Total abdominal hysterectomy; Interstim Implant placement (05/28/2006 - stage I); Interstim implant revision (10/23/2007); Cystoscopy with injection (04/30/2006); Small intestine surgery (05/20/2001); Cardiac catheterization (04/08/2006, 11/16/2009); Cystoscopy (11/11/2008); Cystoscopy w/ retrogrades (11/11/2008); Colectomy; Colectomy (02/04/2000); Gastrocutaneous fistula closure; Exploratory laparotomy (04/27/2009); Port-a-cath removal (07/27/2011); Total hip arthroplasty (08/03/2011); Interstim Implant placement (02/05/2012); Esophagogastroduodenoscopy (N/A, 02/02/2013); and Savory dilation (N/A, 02/02/2013). Her family history includes Breast cancer in her maternal aunt; Diabetes in  her paternal grandfather; Heart disease in her brother, father, mother, and paternal aunt; Stroke in her mother. There is no history of Colon cancer. She  reports that she quit smoking about 27 years ago. She has never used smokeless tobacco. She reports that she does not drink alcohol or use illicit drugs. She has a current medication list which includes the following prescription(s): albuterol, azelastine, carvedilol, cyanocobalamin, cyclobenzaprine, furosemide, gabapentin, loperamide, myrbetriq, nitroglycerin, oxycodone-acetaminophen, pantoprazole, promethazine, temazepam, tiotropium, and cephalexin. Current Outpatient Prescriptions on File Prior to Visit  Medication Sig Dispense Refill  . albuterol (PROAIR HFA) 108 (90 BASE) MCG/ACT inhaler Inhale 2 puffs into the lungs every 6 (six) hours as needed for wheezing.  1 Inhaler  5  . azelastine (OPTIVAR) 0.05 % ophthalmic solution INSTILL 1 DROP INTO BOTH EYES TWICE DAILY  6 mL  11  . carvedilol (COREG) 6.25 MG tablet TAKE ONE TABLET BY MOUTH TWICE DAILY WITH MEALS  60 tablet  5  . cyanocobalamin (,VITAMIN B-12,) 1000 MCG/ML injection Inject 1 mL (1,000 mcg total) into the muscle once. Labs are due now  4 mL  0  . cyclobenzaprine (FLEXERIL) 10 MG tablet TAKE ONE TABLET BY MOUTH THREE TIMES DAILY AS NEEDED FOR MUSCLE SPASM  60 tablet  0  . furosemide (LASIX) 40 MG tablet TAKE ONE TABLET BY MOUTH IN THE MORNING  30 tablet  0  . gabapentin (NEURONTIN) 600 MG tablet TAKE ONE TABLET BY MOUTH 4 TIMES DAILY  120 tablet  2  . loperamide (IMODIUM A-D) 2 MG tablet Take 1/2 tablet by mouth daily as needed for diarrhea.  30 tablet  2  . MYRBETRIQ 25 MG TB24 tablet Take 1 tablet by mouth daily.      . nitroGLYCERIN (NITROSTAT) 0.4 MG SL tablet Place 0.4 mg under the tongue every 5 (five) minutes as needed. CHEST PAIN      . oxyCODONE-acetaminophen (PERCOCET) 10-325 MG per tablet Take 1 tablet by mouth every 4 (four) hours as needed. PAIN  120 tablet  0  .  pantoprazole (PROTONIX) 40 MG tablet TAKE ONE TABLET BY MOUTH ONCE DAILY  30 tablet  2  . promethazine (PHENERGAN) 25 MG tablet Take 1 tablet (25 mg total) by mouth every 8 (eight) hours as needed for  nausea.  40 tablet  0  . temazepam (RESTORIL) 15 MG capsule TAKE ONE TO TWO CAPSULES BY MOUTH AT BEDTIME AS NEEDED FOR  SLEEP  60 capsule  0  . tiotropium (SPIRIVA HANDIHALER) 18 MCG inhalation capsule Place 1 capsule (18 mcg total) into inhaler and inhale every morning.  30 capsule  5  . [DISCONTINUED] iron dextran complex (INFED) 50 MG/ML injection Please give infed infusion (no test dose needed) over 4 hours per pharmacy calculated dose. Ht: 5'2 Wt: 99 pounds Hgb: 12  1 mL  0   No current facility-administered medications on file prior to visit.   She is allergic to dabigatran etexilate mesylate; talwin; pentazocine lactate; and sulfonamide derivatives.  Review of Systems  Review of Systems  Constitutional: Negative for activity change, appetite change and fatigue.  HENT: Negative for hearing loss, congestion, tinnitus and ear discharge.   Eyes: Negative for visual disturbance (see optho q1y -- vision corrected to 20/20 with glasses).  Respiratory: Negative for cough, chest tightness and shortness of breath.   Cardiovascular: Negative for chest pain, palpitations and leg swelling.  Gastrointestinal: Negative for abdominal pain, diarrhea, constipation and abdominal distention.  Genitourinary: Negative for urgency, frequency, decreased urine volume and difficulty urinating.  Musculoskeletal: Negative for back pain, arthralgias and gait problem.  Skin: Negative for color change, pallor and rash.  Neurological: Negative for dizziness, light-headedness, numbness and headaches.  Hematological: Negative for adenopathy. Does not bruise/bleed easily.  Psychiatric/Behavioral: Negative for suicidal ideas, confusion, sleep disturbance, self-injury, dysphoric mood, decreased concentration and  agitation.  Pt is able to read and write and can do all ADLs No risk for falling No abuse/ violence in home     Objective:     Vision by Snellen chart: ophth  Body mass index is 19.84 kg/(m^2). BP 122/60  Pulse 89  Temp(Src) 98 F (36.7 C) (Oral)  Ht 5' (1.524 m)  Wt 101 lb 9.6 oz (46.085 kg)  BMI 19.84 kg/m2  SpO2 97%  BP 122/60  Pulse 89  Temp(Src) 98 F (36.7 C) (Oral)  Ht 5' (1.524 m)  Wt 101 lb 9.6 oz (46.085 kg)  BMI 19.84 kg/m2  SpO2 97% General appearance: alert, cooperative, appears stated age and no distress Head: Normocephalic, without obvious abnormality, atraumatic Eyes: conjunctivae/corneas clear. PERRL, EOM's intact. Fundi benign. Ears: normal TM's and external ear canals both ears Nose: Nares normal. Septum midline. Mucosa normal. No drainage or sinus tenderness. Throat: lips, mucosa, and tongue normal; teeth and gums normal Neck: no adenopathy, no carotid bruit, no JVD, supple, symmetrical, trachea midline and thyroid not enlarged, symmetric, no tenderness/mass/nodules Back: symmetric, no curvature. ROM normal. No CVA tenderness. Lungs: clear to auscultation bilaterally Breasts: normal appearance, no masses or tenderness Heart: regular rate and rhythm, S1, S2 normal, no murmur, click, rub or gallop Abdomen: soft, non-tender; bowel sounds normal; no masses,  no organomegaly and + wound with green/yellow drainage Pelvic: not indicated; post-menopausal, no abnormal Pap smears in past Extremities: extremities normal, atraumatic, no cyanosis or edema Pulses: 2+ and symmetric Skin: Skin color, texture, turgor normal. No rashes or lesions Lymph nodes: Cervical, supraclavicular, and axillary nodes normal. Neurologic: Alert and oriented X 3, normal strength and tone. Normal symmetric reflexes. Normal coordination and gait Psych-- no depression , no anxiety      Assessment:     cpe      Plan:     During the course of the visit the patient was educated  and counseled about appropriate screening  and preventive services including:    Pneumococcal vaccine   Td vaccine  Screening mammography  Bone densitometry screening  Colorectal cancer screening  Diabetes screening  Glaucoma screening  Advanced directives: has an advanced directive - a copy HAS NOT been provided.  Diet review for nutrition referral? Yes ____  Not Indicated __x__   Patient Instructions (the written plan) was given to the patient.  Medicare Attestation I have personally reviewed: The patient's medical and social history Their use of alcohol, tobacco or illicit drugs Their current medications and supplements The patient's functional ability including ADLs,fall risks, home safety risks, cognitive, and hearing and visual impairment Diet and physical activities Evidence for depression or mood disorders  The patient's weight, height, BMI, and visual acuity have been recorded in the chart.  I have made referrals, counseling, and provided education to the patient based on review of the above and I have provided the patient with a written personalized care plan for preventive services.    1. Other screening mammogram  - MM Digital Screening; Future - CBC w/Diff  2. HTN (hypertension) stable - Basic metabolic panel - CBC with Differential - POCT urinalysis dipstick - CBC w/Diff  3. Hyperlipidemia Check labs - Hepatic function panel - Lipid panel - CBC w/Diff  4. Abdominal wound dehiscence  - Wound culture - cephALEXin (KEFLEX) 500 MG capsule; Take 1 capsule (500 mg total) by mouth 2 (two) times daily.  Dispense: 20 capsule; Refill: 0 - CBC w/Diff  5. Medicare annual wellness visit, subsequent  - CBC w/Diff   Garnet Koyanagi, DO   07/20/2013

## 2013-07-21 ENCOUNTER — Telehealth: Payer: Self-pay | Admitting: Family Medicine

## 2013-07-21 NOTE — Telephone Encounter (Signed)
Relevant patient education assigned to patient using Emmi. ° °

## 2013-07-22 ENCOUNTER — Other Ambulatory Visit (INDEPENDENT_AMBULATORY_CARE_PROVIDER_SITE_OTHER): Payer: Medicare Other

## 2013-07-22 DIAGNOSIS — R82998 Other abnormal findings in urine: Secondary | ICD-10-CM

## 2013-07-22 LAB — CBC WITH DIFFERENTIAL/PLATELET
Basophils Absolute: 0 10*3/uL (ref 0.0–0.1)
Basophils Relative: 0.6 % (ref 0.0–3.0)
Eosinophils Absolute: 0.2 10*3/uL (ref 0.0–0.7)
Eosinophils Relative: 3.3 % (ref 0.0–5.0)
HCT: 33.8 % — ABNORMAL LOW (ref 36.0–46.0)
Hemoglobin: 11.1 g/dL — ABNORMAL LOW (ref 12.0–15.0)
Lymphocytes Relative: 11.7 % — ABNORMAL LOW (ref 12.0–46.0)
Lymphs Abs: 0.8 10*3/uL (ref 0.7–4.0)
MCHC: 32.8 g/dL (ref 30.0–36.0)
MCV: 89.8 fl (ref 78.0–100.0)
Monocytes Absolute: 0.5 10*3/uL (ref 0.1–1.0)
Monocytes Relative: 7.2 % (ref 3.0–12.0)
Neutro Abs: 5.6 10*3/uL (ref 1.4–7.7)
Neutrophils Relative %: 77.2 % — ABNORMAL HIGH (ref 43.0–77.0)
Platelets: 291 10*3/uL (ref 150.0–400.0)
RBC: 3.77 Mil/uL — ABNORMAL LOW (ref 3.87–5.11)
RDW: 13.9 % (ref 11.5–15.5)
WBC: 7.3 10*3/uL (ref 4.0–10.5)

## 2013-07-22 LAB — POCT URINALYSIS DIPSTICK
Bilirubin, UA: NEGATIVE
Blood, UA: NEGATIVE
Glucose, UA: NEGATIVE
Ketones, UA: NEGATIVE
Protein, UA: NEGATIVE
Spec Grav, UA: 1.01
Urobilinogen, UA: 0.2
pH, UA: 6.5

## 2013-07-22 LAB — LIPID PANEL
Cholesterol: 171 mg/dL (ref 0–200)
HDL: 37.1 mg/dL — ABNORMAL LOW (ref >39.00)
LDL Cholesterol: 108 mg/dL — ABNORMAL HIGH (ref 0–99)
Total CHOL/HDL Ratio: 5
Triglycerides: 131 mg/dL (ref 0.0–149.0)
VLDL: 26.2 mg/dL (ref 0.0–40.0)

## 2013-07-22 LAB — HEPATIC FUNCTION PANEL
ALT: 13 U/L (ref 0–35)
AST: 18 U/L (ref 0–37)
Albumin: 3.4 g/dL — ABNORMAL LOW (ref 3.5–5.2)
Alkaline Phosphatase: 73 U/L (ref 39–117)
Bilirubin, Direct: 0 mg/dL (ref 0.0–0.3)
Total Bilirubin: 0.1 mg/dL — ABNORMAL LOW (ref 0.2–1.2)
Total Protein: 6.7 g/dL (ref 6.0–8.3)

## 2013-07-22 LAB — BASIC METABOLIC PANEL
BUN: 18 mg/dL (ref 6–23)
CO2: 33 mEq/L — ABNORMAL HIGH (ref 19–32)
Calcium: 9 mg/dL (ref 8.4–10.5)
Chloride: 98 mEq/L (ref 96–112)
Creatinine, Ser: 0.9 mg/dL (ref 0.4–1.2)
GFR: 66.9 mL/min (ref >60.00)
Glucose, Bld: 84 mg/dL (ref 70–99)
Potassium: 4.9 mEq/L (ref 3.5–5.1)
Sodium: 139 mEq/L (ref 135–145)

## 2013-07-23 LAB — WOUND CULTURE
Gram Stain: NONE SEEN
Gram Stain: NONE SEEN

## 2013-07-24 ENCOUNTER — Other Ambulatory Visit: Payer: Self-pay

## 2013-07-24 LAB — URINE CULTURE: Colony Count: >=100000

## 2013-07-24 MED ORDER — CIPROFLOXACIN HCL 500 MG PO TABS: 500.0000 mg | ORAL_TABLET | Freq: Two times a day (BID) | ORAL | Status: DC

## 2013-08-02 ENCOUNTER — Other Ambulatory Visit: Payer: Self-pay | Admitting: Cardiology

## 2013-08-03 ENCOUNTER — Encounter: Payer: Self-pay | Admitting: Cardiology

## 2013-08-03 ENCOUNTER — Ambulatory Visit (INDEPENDENT_AMBULATORY_CARE_PROVIDER_SITE_OTHER): Payer: Medicare Other | Admitting: Cardiology

## 2013-08-03 VITALS — BP 124/62 | HR 79 | Ht 60.0 in | Wt 104.0 lb

## 2013-08-03 DIAGNOSIS — I4891 Unspecified atrial fibrillation: Secondary | ICD-10-CM

## 2013-08-03 DIAGNOSIS — I10 Essential (primary) hypertension: Secondary | ICD-10-CM

## 2013-08-03 DIAGNOSIS — I447 Left bundle-branch block, unspecified: Secondary | ICD-10-CM

## 2013-08-03 MED ORDER — FUROSEMIDE 20 MG PO TABS: ORAL_TABLET | ORAL | Status: DC

## 2013-08-03 NOTE — Progress Notes (Signed)
HPI She presents for followup of a nonischemic cardiomyopathy and atrial fibrillation. She has a history of a nonischemic cardiomyopathy though her last ejection fraction was 40-50%. She has a CRT.  Since I last saw her she said she has had some increased heart rate only in the treadmill where the machine say she's up to 200. However, she doesn't feel this. She doesn't have any presyncope or syncope.. She uses O2 continuously. She does not describe any palpitations or orthostatic symptoms . She had stimulator implant for treatment of bladder incontinence. She has had no further presyncopal episodes. She says that the diuretic in particular exacerbates her incontinence.     Allergies  Allergen Reactions  . Dabigatran Etexilate Mesylate Other (See Comments)    INTERNAL BLEEDING-pradaxa  . Talwin [Pentazocine] Other (See Comments)    hallucinations  . Pentazocine Lactate Other (See Comments)    HALLUCINATIONS  . Sulfonamide Derivatives Other (See Comments)    JITTERINESS     Current Outpatient Prescriptions  Medication Sig Dispense Refill  . albuterol (PROAIR HFA) 108 (90 BASE) MCG/ACT inhaler Inhale 2 puffs into the lungs every 6 (six) hours as needed for wheezing.  1 Inhaler  5  . azelastine (OPTIVAR) 0.05 % ophthalmic solution INSTILL 1 DROP INTO BOTH EYES TWICE DAILY  6 mL  11  . carvedilol (COREG) 6.25 MG tablet TAKE ONE TABLET BY MOUTH TWICE DAILY WITH MEALS  60 tablet  5  . cephALEXin (KEFLEX) 500 MG capsule Take 500 mg by mouth 2 (two) times daily.       . cyanocobalamin (,VITAMIN B-12,) 1000 MCG/ML injection Inject 1 mL (1,000 mcg total) into the muscle once. Labs are due now  4 mL  0  . cyclobenzaprine (FLEXERIL) 10 MG tablet TAKE ONE TABLET BY MOUTH THREE TIMES DAILY AS NEEDED FOR MUSCLE SPASM  60 tablet  0  . furosemide (LASIX) 40 MG tablet TAKE ONE TABLET BY MOUTH IN THE MORNING  30 tablet  0  . gabapentin (NEURONTIN) 600 MG tablet TAKE ONE TABLET BY MOUTH 4 TIMES DAILY  120  tablet  2  . loperamide (IMODIUM A-D) 2 MG tablet Take 1/2 tablet by mouth daily as needed for diarrhea.  30 tablet  2  . MYRBETRIQ 25 MG TB24 tablet Take 1 tablet by mouth daily.      . nitroGLYCERIN (NITROSTAT) 0.4 MG SL tablet Place 0.4 mg under the tongue every 5 (five) minutes as needed. CHEST PAIN      . oxyCODONE-acetaminophen (PERCOCET) 10-325 MG per tablet Take 1 tablet by mouth every 4 (four) hours as needed. PAIN  120 tablet  0  . pantoprazole (PROTONIX) 40 MG tablet TAKE ONE TABLET BY MOUTH ONCE DAILY  30 tablet  2  . promethazine (PHENERGAN) 25 MG tablet Take 1 tablet (25 mg total) by mouth every 8 (eight) hours as needed for nausea.  40 tablet  0  . temazepam (RESTORIL) 15 MG capsule TAKE ONE TO TWO CAPSULES BY MOUTH AT BEDTIME AS NEEDED FOR  SLEEP  60 capsule  0  . tiotropium (SPIRIVA HANDIHALER) 18 MCG inhalation capsule Place 1 capsule (18 mcg total) into inhaler and inhale every morning.  30 capsule  5  . [DISCONTINUED] iron dextran complex (INFED) 50 MG/ML injection Please give infed infusion (no test dose needed) over 4 hours per pharmacy calculated dose. Ht: 5'2 Wt: 99 pounds Hgb: 12  1 mL  0   No current facility-administered medications for this visit.  Past Medical History  Diagnosis Date  . GERD (gastroesophageal reflux disease)   . CAD (coronary artery disease)     mild disease per cath in 2011  . Venous embolism and thrombosis of subclavian vein     after pacemaker insertion Oct 2011  . Pleural effusion      s/p right thoracentesis 03/09  . Small bowel obstruction   . Urinary incontinence      -INTERSTIM IMPLANT NOT FUNCTIONING PER PT  . RLS (restless legs syndrome)   . Primary dilated cardiomyopathy     EF 45 to 50% per echo in Jan 2012  . Vitamin B12 deficiency   . Gastroparesis   . Chronic nausea   . Chronic abdominal pain   . Chronic pain syndrome   . Pacemaker     CRT therapy; followed by Dr. Caryl Comes  . H/O: GI bleed     from Pradaxa  . Oxygen  dependent     2 liters via nasal cannula at all times  . CHF (congestive heart failure)   . Anemia   . Hx of stress fx aug 2011    right hip   . Elbow fracture, left aug 2012  . Arthritis     right hip  . Hx of gastric ulcer   . Diverticulitis   . HTN (hypertension)     under control; has been on med. x "years"  . Hx MRSA infection   . Pneumonia - 03/09, 12/09, 02/10, 06/10    MOST RECENT FEB 2013  . Elevated liver enzymes   . Blood transfusion   . Anginal pain   . Shortness of breath   . Fibromyalgia     COUPLE OF TIMES A YEAR-PT HAS EPISODES OF CONFUSION-USUALLY INVOLVES DAY/NIGHT REVERSAL AND EPISODES OF TWITCHING AND FALLS--SEES DR. Jacelyn Grip - NEUROLOGIST-LAST SEEN 07/10/11  . LBBB (left bundle branch block)   . Atrial fibrillation   . COPD (chronic obstructive pulmonary disease)     continuous O2- 2l  . Hepatitis     hx of in high school   . Silent aspiration     Past Surgical History  Procedure Laterality Date  . Appendectomy    . Pacemaker placement  12/21/2009  . Peg removed      with complications  . Tonsillectomy  age 24  . Interstim implant revision  03/06/2011    Procedure: REVISION OF [name];  Surgeon: Reece Packer, MD;  Location: [facility];  Service: Urology;  Laterality: N/A;  Replacement of Neurostimulator  . Portacath placement  12/16/2009  . Total abdominal hysterectomy      complete  . Interstim implant placement  05/28/2006 - stage I    06/05/2006 - stage II  . Interstim implant revision  10/23/2007  . Cystoscopy with injection  04/30/2006    transurethral collagen injection; incision vaginal stenosis  . Small intestine surgery  05/20/2001    ex. lap., resection of small bowel stricture; gastrostomy; insertion central line  . Cardiac catheterization  04/08/2006, 11/16/2009  . Cystoscopy  11/11/2008  . Cystoscopy w/ retrogrades  11/11/2008    right  . Colectomy      Intestinal resection (5times)  . Colectomy  02/04/2000    ex. lap., intra-abd. subtotal  colectomy with ileosigmoid colon anastomosies and lysis of adhesions  . Gastrocutaneous fistula closure    . Exploratory laparotomy  04/27/2009    lysis of adhesions, gastrostomy tube  . Port-a-cath removal  07/27/2011    Procedure: REMOVAL PORT-A-CATH;  Surgeon: Rolm Bookbinder, MD;  Location: [place];  Service: General;  Laterality: N/A;  . Total hip arthroplasty  08/03/2011    Procedure: TOTAL HIP ARTHROPLASTY ANTERIOR APPROACH;  Surgeon: Mcarthur Rossetti, MD;  Location: [facility];  Service: Orthopedics;  Laterality: Right;  . Interstim implant placement  02/05/2012    Procedure: INTERSTIM IMPLANT FIRST STAGE;  Surgeon: Reece Packer, MD;  Location: [facility];  Service: Urology;  Laterality: N/A;  Replacement of Interstim Lead     . Esophagogastroduodenoscopy N/A 02/02/2013    Procedure: ESOPHAGOGASTRODUODENOSCOPY (EGD);  Surgeon: Lafayette Dragon, MD;  Location: [facility];  Service: Endoscopy;  Laterality: N/A;  . Savory dilation N/A 02/02/2013    Procedure: SAVORY DILATION;  Surgeon: Lafayette Dragon, MD;  Location: [facility];  Service: Endoscopy;  Laterality: N/A;    ROS:   As stated in the HPI and negative for all other systems.  PHYSICAL EXAM BP 124/62  Pulse 79  Ht 5' (1.524 m)  Wt 104 lb (47.174 kg)  BMI 20.31 kg/m2 GENERAL:  Frail appearing NECK:  No jugular venous distention, waveform within normal limits, carotid upstroke brisk and symmetric, no bruits, no thyromegaly LYMPHATICS:  No cervical, inguinal adenopathy LUNGS:  Clear to auscultation bilaterally BACK:  No CVA tenderness CHEST: CRT pocket intact HEART:  PMI not displaced or sustained,S1 and S2 within normal limits, no S3, no S4, no clicks, no rubs, no murmurs ABD:  Flat, positive bowel sounds normal in frequency in pitch, no bruits, no rebound, no guarding, no midline pulsatile mass, no hepatomegaly, no splenomegaly EXT:  2 plus pulses throughout, no edema, no cyanosis no  clubbing  ASSESSMENT AND PLAN   ATRIAL FIBRILLATION -  She seems to bemaintaining sinus rhythm. She is off of amiodarone. A She apparently did have some infrequent paroxysms of fibrillation on device interrogation in the past.  I will arrange to have this interrogated sooner than was scheduled given her complaints of rapid heart rate on the treadmill.  CARDIOMYOPATHY, PRIMARY, DILATED -  Her EF is 50%. She is euvolemic.  However, with her frequent urination and incontinence she wants to reduce the dose of her Lasix and I think it is reasonable to try 20 mg daily. We also talked about the diaphragmatic stimulation that she has occasionally. This is not particularly bothersome.  HYPERTENSION -  The blood pressure is at target. No change in medications is indicated. We will continue with therapeutic lifestyle changes (TLC).

## 2013-08-03 NOTE — Patient Instructions (Signed)
Please decrease your Furosemide to 20 mg a day. Continue all other medications as listed.  Please schedule for a device check.  Follow up in 6 months with Dr Percival Spanish at the [facility] office.  You will receive a letter in the mail 2 months before you are due.  Please call us when you receive this letter to schedule your follow up appointment.

## 2013-08-04 ENCOUNTER — Other Ambulatory Visit: Payer: Self-pay

## 2013-08-04 MED ORDER — CARVEDILOL 6.25 MG PO TABS: ORAL_TABLET | ORAL | Status: DC

## 2013-08-10 ENCOUNTER — Other Ambulatory Visit: Payer: Self-pay | Admitting: Family Medicine

## 2013-08-11 ENCOUNTER — Encounter (INDEPENDENT_AMBULATORY_CARE_PROVIDER_SITE_OTHER): Payer: Medicare Other | Admitting: General Surgery

## 2013-08-11 NOTE — Telephone Encounter (Signed)
Last seen 07/20/13 and filled 07/09/13 #60. Please advise    KP

## 2013-08-14 ENCOUNTER — Encounter (INDEPENDENT_AMBULATORY_CARE_PROVIDER_SITE_OTHER): Payer: Medicare Other | Admitting: General Surgery

## 2013-08-17 ENCOUNTER — Other Ambulatory Visit: Payer: Self-pay | Admitting: Family Medicine

## 2013-08-18 ENCOUNTER — Other Ambulatory Visit: Payer: Self-pay | Admitting: Family Medicine

## 2013-08-20 ENCOUNTER — Encounter: Payer: Self-pay | Admitting: Internal Medicine

## 2013-08-21 ENCOUNTER — Other Ambulatory Visit: Payer: Self-pay | Admitting: Cardiology

## 2013-08-24 ENCOUNTER — Ambulatory Visit (INDEPENDENT_AMBULATORY_CARE_PROVIDER_SITE_OTHER): Payer: Medicare Other | Admitting: *Deleted

## 2013-08-24 ENCOUNTER — Encounter: Payer: Self-pay | Admitting: Internal Medicine

## 2013-08-24 ENCOUNTER — Telehealth: Payer: Self-pay | Admitting: Family Medicine

## 2013-08-24 DIAGNOSIS — I428 Other cardiomyopathies: Secondary | ICD-10-CM

## 2013-08-24 DIAGNOSIS — R52 Pain, unspecified: Secondary | ICD-10-CM

## 2013-08-24 DIAGNOSIS — I5022 Chronic systolic (congestive) heart failure: Secondary | ICD-10-CM

## 2013-08-24 LAB — MDC_IDC_ENUM_SESS_TYPE_INCLINIC
Battery Remaining Longevity: 47 mo
Battery Voltage: 2.99 V
Brady Statistic AP VP Percent: 0 %
Brady Statistic AP VS Percent: 0 %
Brady Statistic AS VP Percent: 99.9 %
Brady Statistic AS VS Percent: 0.1 %
Brady Statistic RA Percent Paced: 0.01 %
Brady Statistic RV Percent Paced: 99.9 %
Date Time Interrogation Session: 20150608115910
Lead Channel Impedance Value: 304 Ohm
Lead Channel Impedance Value: 361 Ohm
Lead Channel Impedance Value: 475 Ohm
Lead Channel Impedance Value: 494 Ohm
Lead Channel Impedance Value: 551 Ohm
Lead Channel Impedance Value: 551 Ohm
Lead Channel Impedance Value: 608 Ohm
Lead Channel Impedance Value: 646 Ohm
Lead Channel Impedance Value: 798 Ohm
Lead Channel Pacing Threshold Amplitude: 0.5 V
Lead Channel Pacing Threshold Amplitude: 0.5 V
Lead Channel Pacing Threshold Amplitude: 1.25 V
Lead Channel Pacing Threshold Pulse Width: 0.4 ms
Lead Channel Pacing Threshold Pulse Width: 0.4 ms
Lead Channel Pacing Threshold Pulse Width: 0.8 ms
Lead Channel Sensing Intrinsic Amplitude: 0.875 mV
Lead Channel Sensing Intrinsic Amplitude: 1.25 mV
Lead Channel Sensing Intrinsic Amplitude: 7.75 mV
Lead Channel Setting Pacing Amplitude: 1.25 V
Lead Channel Setting Pacing Amplitude: 2 V
Lead Channel Setting Pacing Amplitude: 2.5 V
Lead Channel Setting Pacing Pulse Width: 0.4 ms
Lead Channel Setting Pacing Pulse Width: 0.8 ms
Lead Channel Setting Sensing Sensitivity: 0.9 mV
Zone Setting Detection Interval: 350 ms
Zone Setting Detection Interval: 400 ms

## 2013-08-24 MED ORDER — OXYCODONE-ACETAMINOPHEN 10-325 MG PO TABS: 1.0000 | ORAL_TABLET | ORAL | Status: DC | PRN

## 2013-08-24 NOTE — Telephone Encounter (Signed)
Refill x1 

## 2013-08-24 NOTE — Telephone Encounter (Signed)
Last seen 07/20/12 and filled 07/07/13 #120. Please advise     KP

## 2013-08-24 NOTE — Progress Notes (Signed)
CRT-P device check in clinic. Normal device function. Thresholds, sensing, impedance consistent with previous measurements. Histograms appropriate for patient and level of activity. No mode switches.  1 SVT episode. Patient bi-ventricularly pacing >100 % of the time. Device programmed with appropriate safety margins. Optivol and thoracic impedance abnormal 5/23 ongoing but the patient remains asymptomatic.   Estimated longevity 3.5 years.

## 2013-08-24 NOTE — Telephone Encounter (Signed)
Caller name:Avaley Nyra Capes  Relation to BW:IOMBTDH Call back number: [phone] Pharmacy:  Reason for call:  Patient called to request a refill for hydrocodone.

## 2013-08-24 NOTE — Telephone Encounter (Signed)
MSG left making patient aware Oxycodone is ready for pick up.      KP

## 2013-09-10 ENCOUNTER — Telehealth: Payer: Self-pay | Admitting: *Deleted

## 2013-09-10 NOTE — Telephone Encounter (Signed)
Message copied by Hulan Saas on Thu Sep 10, 2013  9:57 AM ------      Message from: Hulan Saas      Created: Tue Jun 16, 2013  1:30 PM       Call and remind patient due for labs prior to [place] on 09/14/12 DB Lab in EPIC ------

## 2013-09-10 NOTE — Telephone Encounter (Signed)
Spoke with patient and she will come for labs prior to her 09/29/13 OV.

## 2013-09-10 NOTE — Telephone Encounter (Signed)
Left a message for patient to call back. 

## 2013-09-14 ENCOUNTER — Other Ambulatory Visit: Payer: Self-pay | Admitting: Family Medicine

## 2013-09-14 ENCOUNTER — Telehealth: Payer: Self-pay | Admitting: Internal Medicine

## 2013-09-14 MED ORDER — PANTOPRAZOLE SODIUM 40 MG PO TBEC: DELAYED_RELEASE_TABLET | ORAL | Status: DC

## 2013-09-14 NOTE — Telephone Encounter (Signed)
Rx sent until patient's appointment on 09/29/2013.

## 2013-09-14 NOTE — Telephone Encounter (Signed)
Last seen 07/21/14 and filled 08/11/13 #60. Please advise    KP

## 2013-09-22 ENCOUNTER — Telehealth: Payer: Self-pay | Admitting: Family Medicine

## 2013-09-22 NOTE — Telephone Encounter (Signed)
Lmovm for Patient to schedule her prolia injection with our office.

## 2013-09-23 ENCOUNTER — Other Ambulatory Visit (INDEPENDENT_AMBULATORY_CARE_PROVIDER_SITE_OTHER): Payer: Medicare Other

## 2013-09-23 DIAGNOSIS — D649 Anemia, unspecified: Secondary | ICD-10-CM

## 2013-09-23 LAB — CBC WITH DIFFERENTIAL/PLATELET
Basophils Absolute: 0 10*3/uL (ref 0.0–0.1)
Basophils Relative: 0.4 % (ref 0.0–3.0)
Eosinophils Absolute: 0.4 10*3/uL (ref 0.0–0.7)
Eosinophils Relative: 4.8 % (ref 0.0–5.0)
HCT: 35 % — ABNORMAL LOW (ref 36.0–46.0)
Hemoglobin: 11.5 g/dL — ABNORMAL LOW (ref 12.0–15.0)
Lymphocytes Relative: 13.6 % (ref 12.0–46.0)
Lymphs Abs: 1 10*3/uL (ref 0.7–4.0)
MCHC: 32.8 g/dL (ref 30.0–36.0)
MCV: 88.1 fl (ref 78.0–100.0)
Monocytes Absolute: 0.5 10*3/uL (ref 0.1–1.0)
Monocytes Relative: 7.5 % (ref 3.0–12.0)
Neutro Abs: 5.4 10*3/uL (ref 1.4–7.7)
Neutrophils Relative %: 73.7 % (ref 43.0–77.0)
Platelets: 302 10*3/uL (ref 150.0–400.0)
RBC: 3.98 Mil/uL (ref 3.87–5.11)
RDW: 14.4 % (ref 11.5–15.5)
WBC: 7.3 10*3/uL (ref 4.0–10.5)

## 2013-09-23 LAB — COMPREHENSIVE METABOLIC PANEL
ALT: 20 U/L (ref 0–35)
AST: 26 U/L (ref 0–37)
Albumin: 3.4 g/dL — ABNORMAL LOW (ref 3.5–5.2)
Alkaline Phosphatase: 102 U/L (ref 39–117)
BUN: 22 mg/dL (ref 6–23)
CO2: 31 mEq/L (ref 19–32)
Calcium: 8.6 mg/dL (ref 8.4–10.5)
Chloride: 103 mEq/L (ref 96–112)
Creatinine, Ser: 1 mg/dL (ref 0.4–1.2)
GFR: 56.49 mL/min — ABNORMAL LOW (ref >60.00)
Glucose, Bld: 97 mg/dL (ref 70–99)
Potassium: 4.5 mEq/L (ref 3.5–5.1)
Sodium: 141 mEq/L (ref 135–145)
Total Bilirubin: 0.5 mg/dL (ref 0.2–1.2)
Total Protein: 6.3 g/dL (ref 6.0–8.3)

## 2013-09-23 LAB — IBC PANEL
Iron: 84 ug/dL (ref 42–145)
Saturation Ratios: 32.5 % (ref 20.0–50.0)
Transferrin: 184.7 mg/dL — ABNORMAL LOW (ref 212.0–360.0)

## 2013-09-25 NOTE — Telephone Encounter (Signed)
Close encounter 

## 2013-09-29 ENCOUNTER — Encounter: Payer: Self-pay | Admitting: Internal Medicine

## 2013-09-29 ENCOUNTER — Ambulatory Visit (INDEPENDENT_AMBULATORY_CARE_PROVIDER_SITE_OTHER): Payer: Medicare Other | Admitting: Internal Medicine

## 2013-09-29 VITALS — BP 110/64 | HR 84 | Ht 60.5 in | Wt 104.2 lb

## 2013-09-29 DIAGNOSIS — K219 Gastro-esophageal reflux disease without esophagitis: Secondary | ICD-10-CM

## 2013-09-29 DIAGNOSIS — D509 Iron deficiency anemia, unspecified: Secondary | ICD-10-CM

## 2013-09-29 DIAGNOSIS — R197 Diarrhea, unspecified: Secondary | ICD-10-CM

## 2013-09-29 DIAGNOSIS — K56609 Unspecified intestinal obstruction, unspecified as to partial versus complete obstruction: Secondary | ICD-10-CM

## 2013-09-29 NOTE — Patient Instructions (Signed)
Dr Halford Chessman, Dr Etter Sjogren

## 2013-09-29 NOTE — Progress Notes (Addendum)
[name] 1946/03/02 [id number]  Note: This dictation was prepared with Dragon digital system. Any transcriptional errors that result from this procedure are unintentional.   History of Present Illness:  This is a 68 year old white female with chronic low-grade small bowel obstruction due to adhesive disease. She had multiple abdominal surgeries including subtotal colectomy with only 20 cm of remaining colon. Her last surgery was in March 2011 for lysis of adhesions but the procedure was unsuccessful. She has a persistent stricture in distal ileum which was visualized on a flexible sigmoidoscopy in 2012. She has had chronic low-grade GI blood loss responsive to iron infusions. Her hemoglobin has recently been stable at 11.5. She has a history of an upper GI bleed while on Pradaxa. She has oxygen-dependent COPD followed by Dr.Sood. Her last appointment was on 06/09/2013. She weighed 98 pounds. Today, she is up to 104 pounds. She has had only several minor episodes of obstructive symptoms but overall she has done quite well. She needs a refill on Protonix 40 mg twice a day. Her old PEG site opened up and drained for several weeks but it has closed up now.     Past Medical History  Diagnosis Date  . GERD (gastroesophageal reflux disease)   . CAD (coronary artery disease)     mild disease per cath in 2011  . Venous embolism and thrombosis of subclavian vein     after pacemaker insertion Oct 2011  . Pleural effusion      s/p right thoracentesis 03/09  . Small bowel obstruction   . Urinary incontinence      -INTERSTIM IMPLANT NOT FUNCTIONING PER PT  . RLS (restless legs syndrome)   . Primary dilated cardiomyopathy     EF 45 to 50% per echo in Jan 2012  . Vitamin B12 deficiency   . Gastroparesis   . Chronic nausea   . Chronic abdominal pain   . Chronic pain syndrome   . Pacemaker     CRT therapy; followed by Dr. Caryl Comes  . H/O: GI bleed     from Pradaxa  . Oxygen dependent     2 liters  via nasal cannula at all times  . CHF (congestive heart failure)   . Anemia   . Hx of stress fx aug 2011    right hip   . Elbow fracture, left aug 2012  . Arthritis     right hip  . Hx of gastric ulcer   . Diverticulitis   . HTN (hypertension)     under control; has been on med. x "years"  . Hx MRSA infection   . Pneumonia - 03/09, 12/09, 02/10, 06/10    MOST RECENT FEB 2013  . Elevated liver enzymes   . Blood transfusion   . Anginal pain   . Shortness of breath   . Fibromyalgia     COUPLE OF TIMES A YEAR-PT HAS EPISODES OF CONFUSION-USUALLY INVOLVES DAY/NIGHT REVERSAL AND EPISODES OF TWITCHING AND FALLS--SEES DR. Jacelyn Grip - NEUROLOGIST-LAST SEEN 07/10/11  . LBBB (left bundle branch block)   . Atrial fibrillation   . COPD (chronic obstructive pulmonary disease)     continuous O2- 2l  . Hepatitis     hx of in high school   . Silent aspiration     Past Surgical History  Procedure Laterality Date  . Appendectomy    . Pacemaker placement  12/21/2009  . Peg removed      with complications  . Tonsillectomy  age 89  .  Interstim implant revision  03/06/2011    Procedure: REVISION OF [name];  Surgeon: Reece Packer, MD;  Location: [facility];  Service: Urology;  Laterality: N/A;  Replacement of Neurostimulator  . Portacath placement  12/16/2009  . Total abdominal hysterectomy      complete  . Interstim implant placement  05/28/2006 - stage I    06/05/2006 - stage II  . Interstim implant revision  10/23/2007  . Cystoscopy with injection  04/30/2006    transurethral collagen injection; incision vaginal stenosis  . Small intestine surgery  05/20/2001    ex. lap., resection of small bowel stricture; gastrostomy; insertion central line  . Cardiac catheterization  04/08/2006, 11/16/2009  . Cystoscopy  11/11/2008  . Cystoscopy w/ retrogrades  11/11/2008    right  . Colectomy      Intestinal resection (5times)  . Colectomy  02/04/2000    ex. lap., intra-abd. subtotal colectomy with  ileosigmoid colon anastomosies and lysis of adhesions  . Gastrocutaneous fistula closure    . Exploratory laparotomy  04/27/2009    lysis of adhesions, gastrostomy tube  . Port-a-cath removal  07/27/2011    Procedure: REMOVAL PORT-A-CATH;  Surgeon: Rolm Bookbinder, MD;  Location: [facility];  Service: General;  Laterality: N/A;  . Total hip arthroplasty  08/03/2011    Procedure: TOTAL HIP ARTHROPLASTY ANTERIOR APPROACH;  Surgeon: Mcarthur Rossetti, MD;  Location: [facility];  Service: Orthopedics;  Laterality: Right;  . Interstim implant placement  02/05/2012    Procedure: INTERSTIM IMPLANT FIRST STAGE;  Surgeon: Reece Packer, MD;  Location: [facility];  Service: Urology;  Laterality: N/A;  Replacement of Interstim Lead     . Esophagogastroduodenoscopy N/A 02/02/2013    Procedure: ESOPHAGOGASTRODUODENOSCOPY (EGD);  Surgeon: Lafayette Dragon, MD;  Location: [facility];  Service: Endoscopy;  Laterality: N/A;  . Savory dilation N/A 02/02/2013    Procedure: SAVORY DILATION;  Surgeon: Lafayette Dragon, MD;  Location: [facility];  Service: Endoscopy;  Laterality: N/A;    Allergies  Allergen Reactions  . Dabigatran Etexilate Mesylate Other (See Comments)    INTERNAL BLEEDING-pradaxa  . Talwin [Pentazocine] Other (See Comments)    hallucinations  . Pentazocine Lactate Other (See Comments)    HALLUCINATIONS  . Sulfonamide Derivatives Other (See Comments)    JITTERINESS     Family history and social history have been reviewed.  Review of Systems:  negative for dysphagia vomiting fever rectal bleeding   The remainder of the 10 point ROS is negative except as outlined in the H&P  Physical Exam: General Appearance  seen chronically ill appearing with oxygen prongs, in no distress Eyes  Non icteric  HEENT  Non traumatic, normocephalic  Mouth No lesion, tongue papillated, no cheilosis Neck Supple without adenopathy, thyroid not enlarged, no carotid bruits, no JVD Lungs  Clear to auscultation bilaterally COR Normal S1, normal S2, regular rhythm, no murmur, quiet precordium Abdomen  tends mildly tender diffusely mostly left upper quadrant. Well-healed surgical scars. PEG site has a small scab over it. No active drainage. Increased bowel sounds. No palpable mass  Rectal  not done  Extremities  No pedal edema Skin No lesions Neurological Alert and oriented x 3 Psychological Normal mood and affect  Assessment and Plan:   Problem #62 68 year old white female with chronic low-grade small bowel obstruction which has been recently quite stable. She has not required hospitalization for several years. Also, her hemoglobin has been stable recently without needing iron transfusions. She will continue on  the Protonix 40 mg twice a day, B12 injections and Imodium when necessary. We arerechecking her blood tests at her next appointment in 3 months.     Delfin Edis 09/29/2013

## 2013-09-30 ENCOUNTER — Ambulatory Visit (INDEPENDENT_AMBULATORY_CARE_PROVIDER_SITE_OTHER): Payer: Medicare Other | Admitting: *Deleted

## 2013-09-30 ENCOUNTER — Telehealth: Payer: Self-pay | Admitting: Family Medicine

## 2013-09-30 DIAGNOSIS — R52 Pain, unspecified: Secondary | ICD-10-CM

## 2013-09-30 DIAGNOSIS — E538 Deficiency of other specified B group vitamins: Secondary | ICD-10-CM

## 2013-09-30 DIAGNOSIS — M810 Age-related osteoporosis without current pathological fracture: Secondary | ICD-10-CM

## 2013-09-30 MED ORDER — DENOSUMAB 60 MG/ML ~~LOC~~ SOLN
60.0000 mg | Freq: Once | SUBCUTANEOUS | Status: AC
  Administered 2013-09-30: 60 mg via SUBCUTANEOUS

## 2013-09-30 MED ORDER — OXYCODONE-ACETAMINOPHEN 10-325 MG PO TABS: 1.0000 | ORAL_TABLET | ORAL | Status: DC | PRN

## 2013-09-30 MED ORDER — CYANOCOBALAMIN 1000 MCG/ML IJ SOLN
1000.0000 ug | Freq: Once | INTRAMUSCULAR | Status: AC
  Administered 2013-09-30: 1000 ug via INTRAMUSCULAR

## 2013-09-30 NOTE — Telephone Encounter (Signed)
Patient is in the office to pick up the medication.    KP

## 2013-09-30 NOTE — Telephone Encounter (Signed)
Caller name: Nyellie  Call back [place]   Reason for call:  Pt would like a refill on Rx oxyCODONE-acetaminophen (PERCOCET) 10-325 MG per tablet

## 2013-09-30 NOTE — Progress Notes (Signed)
Pt came in the office today for her 6 mos Prolia injection.  Pt tolerated the injection well.  While here the pt mentioned that she needs her B12 injection.  She stated that she brought her own B12.  Asked the pt when was her last B12 and who gives her the injection, and when was her last labs for Vit B12.  She stated the her last B12 was 3 mos ,and she was not sure if it was given here are at Dr. Nichola Sizer office.  Pt stated that Dr. Olevia Perches has done recent lab work.  Looked to see when the pt had last labs and appt with Dr. Olevia Perches, and pt was seen by Dr. Olevia Perches on (09-29-13),and it was stated for the pt to continue B12.  Her last labs was done (06-15-13) and the B12 was normal.  Informed the pt that I will need to get the order from Dr. Etter Sjogren to give her the B12.  Spoke with Dr. Etter Sjogren and she review the pt's chart and she verbally gave me the order to give the pt her B12.  B12 injection was given and pt tolerated it well.  Asked the pt to schedule here next B12 injection on her way out  Pt agreed and stated that she will if there is not a line.//AB/CMA

## 2013-09-30 NOTE — Telephone Encounter (Signed)
Refill x1 

## 2013-09-30 NOTE — Telephone Encounter (Signed)
Last seen 07/20/13 and filled 08/24/13 #120. UDS 01/09/13 low risk   Please advise    KP

## 2013-10-07 ENCOUNTER — Telehealth: Payer: Self-pay | Admitting: Internal Medicine

## 2013-10-07 MED ORDER — PANTOPRAZOLE SODIUM 40 MG PO TBEC: 40.0000 mg | DELAYED_RELEASE_TABLET | Freq: Two times a day (BID) | ORAL | Status: DC

## 2013-10-07 NOTE — Telephone Encounter (Signed)
Rx sent 

## 2013-10-08 ENCOUNTER — Telehealth (INDEPENDENT_AMBULATORY_CARE_PROVIDER_SITE_OTHER): Payer: Self-pay

## 2013-10-08 NOTE — Telephone Encounter (Signed)
Patient has had multiple previous surgeries by Dr. Donne Hazel.  Pt is calling today complaining of some pain at a drain site on her left upper quadrant. Pt states that she has seen Dr Grandville Silos in April, he felt that the area was scar tissue. Pt states that she has noticed the area has started to become more tender, red and swollen. Pt would prefer to see Dr Donne Hazel. Informed pt that I would send him a message with her complications and we would her with appt time. Pt verbalized understanding.

## 2013-10-12 ENCOUNTER — Other Ambulatory Visit: Payer: Self-pay | Admitting: Family Medicine

## 2013-10-12 NOTE — Telephone Encounter (Signed)
LMOM calling to check on pt and see about an appt to be made with Dr Donne Hazel.

## 2013-10-12 NOTE — Telephone Encounter (Signed)
last seen 07/20/13 and both filled 09/14/13 #60.   Please advise     KP

## 2013-10-12 NOTE — Telephone Encounter (Signed)
Pt returned my call. I was asking about the message below when pt called last week. The pt reports that she has swelling from time to time where the feeding tube was placed and she worries about the swelling. The pt remembers that Dr Grandville Silos told her it was scar tissue but last week it really got sore. The pt reports today it's much better with no swelling now. I advised pt that I was not going to make her an appt since the area is better but if it happens again to please call the office to get an appt that week. Pt understands.

## 2013-10-15 NOTE — Telephone Encounter (Signed)
Returned pt's call. Pt calling in today b/c the area where the g-tube used to be is swelling up again and now having drainage. The pt said that whatever she eats or drinks will come out of the area where the g-tube used to be. The pt is not having any fevers,chills,or odor from the drainage. The pt just wants to know why she is having the swelling and drainage. The pt said she is just concerned with the swelling when it flares up b/c it causes discomfort. The pt just wants to make sure that there is nothing she needs to be worried about with old g-tube site. The pt leaves to go to [place] on 8/3 thru 8/13. I advised pt that I will send Dr Donne Hazel a message and let her know what he would advise. Pt aware that he is not in the office this week. The pt will just keep area on abdomen clean w/soap and water covered with a bandage. I will be back in touch with pt.

## 2013-10-18 NOTE — Telephone Encounter (Signed)
I dont think so. Only way to be sure would be to see her and scan her but in the absence of any other symptoms she is probably fine

## 2013-10-20 ENCOUNTER — Telehealth (INDEPENDENT_AMBULATORY_CARE_PROVIDER_SITE_OTHER): Payer: Self-pay

## 2013-10-20 NOTE — Telephone Encounter (Signed)
LMOM notifying pt that I am having to r/s the appt I just made for her on 8/27 to another day due to Dr Donne Hazel adding a surgery case on in the middle of the clinic. I gave the pt an appt for 9/4 but before I could reserve the time another person in the office had already given the appt away. I now have made the appt for 9/3 arrive at 1:15/1:30.

## 2013-10-20 NOTE — Telephone Encounter (Signed)
Called pt to notify her that we just need to make her an appt with Dr Donne Hazel. I scheduled pt for an appt on 11/12/13 arrive at 10:15/10:30. Pt understands.

## 2013-11-03 ENCOUNTER — Telehealth: Payer: Self-pay

## 2013-11-03 ENCOUNTER — Ambulatory Visit (INDEPENDENT_AMBULATORY_CARE_PROVIDER_SITE_OTHER): Payer: Medicare Other

## 2013-11-03 ENCOUNTER — Ambulatory Visit: Payer: Medicare Other | Admitting: Family Medicine

## 2013-11-03 DIAGNOSIS — E538 Deficiency of other specified B group vitamins: Secondary | ICD-10-CM

## 2013-11-03 DIAGNOSIS — R52 Pain, unspecified: Secondary | ICD-10-CM

## 2013-11-03 MED ORDER — OXYCODONE-ACETAMINOPHEN 10-325 MG PO TABS: 1.0000 | ORAL_TABLET | ORAL | Status: DC | PRN

## 2013-11-03 MED ORDER — CYANOCOBALAMIN 1000 MCG/ML IJ SOLN
1000.0000 ug | Freq: Once | INTRAMUSCULAR | Status: AC
  Administered 2013-11-03: 1000 ug via INTRAMUSCULAR

## 2013-11-03 NOTE — Telephone Encounter (Signed)
Refill x1 

## 2013-11-03 NOTE — Telephone Encounter (Signed)
Last seen 07/20/13 and filled 09/30/2013 #120.  UDS 01/09/13 low risk  Please advise

## 2013-11-03 NOTE — Telephone Encounter (Signed)
Rx printed. Signed. Placed at front for pick up. Patient notified.

## 2013-11-06 ENCOUNTER — Encounter: Payer: Self-pay | Admitting: Pulmonary Disease

## 2013-11-06 ENCOUNTER — Ambulatory Visit (INDEPENDENT_AMBULATORY_CARE_PROVIDER_SITE_OTHER): Payer: Medicare Other | Admitting: Pulmonary Disease

## 2013-11-06 VITALS — BP 98/60 | HR 81 | Ht 60.0 in | Wt 102.2 lb

## 2013-11-06 DIAGNOSIS — J9610 Chronic respiratory failure, unspecified whether with hypoxia or hypercapnia: Secondary | ICD-10-CM

## 2013-11-06 DIAGNOSIS — J432 Centrilobular emphysema: Secondary | ICD-10-CM

## 2013-11-06 DIAGNOSIS — J9611 Chronic respiratory failure with hypoxia: Secondary | ICD-10-CM

## 2013-11-06 DIAGNOSIS — J189 Pneumonia, unspecified organism: Secondary | ICD-10-CM

## 2013-11-06 DIAGNOSIS — R0902 Hypoxemia: Secondary | ICD-10-CM

## 2013-11-06 DIAGNOSIS — J438 Other emphysema: Secondary | ICD-10-CM

## 2013-11-06 NOTE — Progress Notes (Signed)
Chief Complaint  Patient presents with  . Follow-up    No complaints today.     History of Present Illness: [name] is a 68 y.o. female with history of COPD/emphysema, chronic hypoxemic respiratory failure, and recurrent pneumonia with focal areas of bronchiectasis.  She has been doing okay.  She has not been on abx since her last visit.  She denies chest pain, cough, or sputum.  She had a terrible GI infection when visiting family in [place] this Summer, but this has resolved.  TESTS: 06/28/08 PFT >> FEV1 1.95 (97%), FEV1% 70, TLC 3.88 (87%), DLCO 64% ONO 02/11/10 >> Test time 9 hrs 9 min, Mean SpO2 87%, low SpO2 80%, Spent 5hrs 57 min (65%) with SpO2 < 88%. 10/10/11 PFT >> FEV1 1.15 (59%), FEV1% 64, TLC 3.26 (73%), DLCO 35%, + BD CT chest 04/30/2011 >> Chronic multifocal nodular and patchy airspace densities throughout the right lung with mild BTX.  PMHx, PSHx, Medications, Allergies, Fhx, Shx reviewed.  Physical Exam:  General - No distress, wearing oxygen ENT - No sinus tenderness, no oral exudate, no LAN Cardiac - s1s2 regular, no murmur Chest - Prolonged exhalation, decreased breath sounds, no wheeze Back - No focal tenderness Abd - Soft, non-tender Ext - No edema Neuro - Normal strength Skin - No rashes Psych - normal mood, and behavior   Assessment/Plan:  Chesley Mires, MD [facility] Pager:  [phone] 11/06/2013, 4:43 PM

## 2013-11-06 NOTE — Patient Instructions (Signed)
Follow up in 6 months 

## 2013-11-08 NOTE — Assessment & Plan Note (Signed)
Continue oxygen 24/7.

## 2013-11-08 NOTE — Assessment & Plan Note (Signed)
Related to chronic aspiration.  Stable at present.

## 2013-11-08 NOTE — Assessment & Plan Note (Signed)
Stable.  Continue spiriva and prn albuterol.

## 2013-11-11 ENCOUNTER — Other Ambulatory Visit: Payer: Self-pay | Admitting: Family Medicine

## 2013-11-11 ENCOUNTER — Telehealth: Payer: Self-pay | Admitting: Family Medicine

## 2013-11-11 ENCOUNTER — Telehealth: Payer: Self-pay

## 2013-11-11 MED ORDER — TRAZODONE 25 MG HALF TABLET: 25.0000 mg | ORAL_TABLET | Freq: Every evening | ORAL | Status: DC | PRN

## 2013-11-11 MED ORDER — TRAZODONE HCL 50 MG PO TABS
25.0000 mg | ORAL_TABLET | Freq: Every evening | ORAL | Status: DC | PRN
Start: 2013-11-11 — End: 2013-12-18

## 2013-11-11 NOTE — Telephone Encounter (Signed)
error 

## 2013-11-11 NOTE — Telephone Encounter (Signed)
Last seen 07/20/13 and filled 10/12/13 #60. Please advise     KP

## 2013-11-11 NOTE — Telephone Encounter (Signed)
Patient aware Trazodone is being faxed     KP

## 2013-11-11 NOTE — Telephone Encounter (Addendum)
Call from Drai at the pharmacy and they do not make 25 mg of Trazodone, she stated they would need to change Rx. Per Dr.Tabpori, ok to call in Trazodone 50 1/2-1 by mouth qhs #30. Rx has been phoned in.    [place]

## 2013-11-11 NOTE — Telephone Encounter (Signed)
D/c restoril trazadone 25mg  ----1-2 po qhs prn #60

## 2013-11-11 NOTE — Telephone Encounter (Signed)
Caller name: Hayleigh  Relation to pt: self  Call back [phone]   Reason for call: pt states temazepam (RESTORIL) 15 MG capsule does not work and would like to try another medication.

## 2013-11-11 NOTE — Addendum Note (Signed)
Addended by: Ewing Schlein on: 11/11/2013 04:02 PM   Modules accepted: Orders

## 2013-11-11 NOTE — Telephone Encounter (Signed)
Please advise      KP 

## 2013-11-12 ENCOUNTER — Encounter (INDEPENDENT_AMBULATORY_CARE_PROVIDER_SITE_OTHER): Payer: Medicare Other | Admitting: General Surgery

## 2013-11-14 IMAGING — RF DG ESOPHAGUS
13 of 19 series · 14 of 24 positions shown · non-contrast
Comparison: Abdominal pelvic CT 12/11/2011)

FLUOROSCOPY TIME:  One Min and 4 seconds.

CLINICAL DATA: Dysphagia. Patient reports food sticking in throat.

EXAM:
ESOPHOGRAM / BARIUM SWALLOW / BARIUM TABLET STUDY
TECHNIQUE: Combined double contrast and single contrast examination performed
using effervescent crystals, thick barium liquid, and thin barium
liquid. The patient was observed with fluoroscopy swallowing a 13mm
barium sulphate tablet.

[Series 1: run · 1 of 4 slices shown (1 of 13)]
[im 1/4]
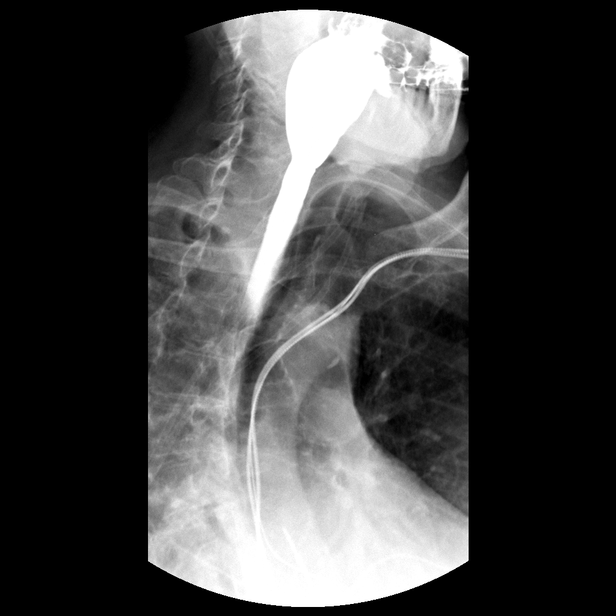

[Series 2: run · 1 of 3 slices shown (2 of 13)]
[im 1/3]
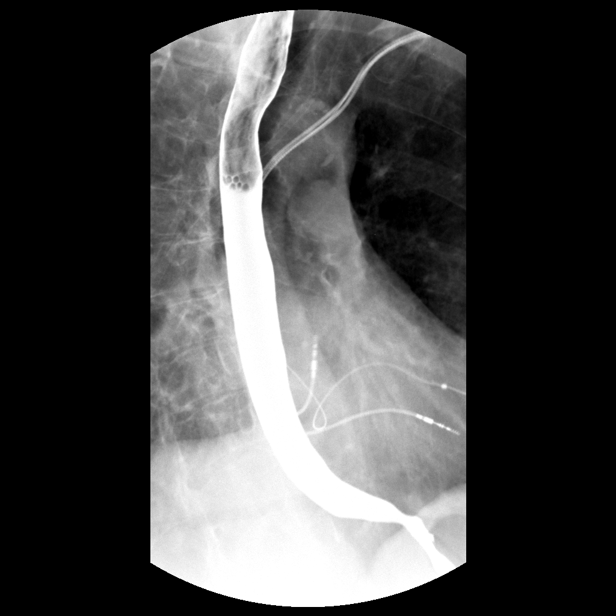

[Series 3: run · 2 of 7 slices shown (3 of 13)]
[im 3/7]
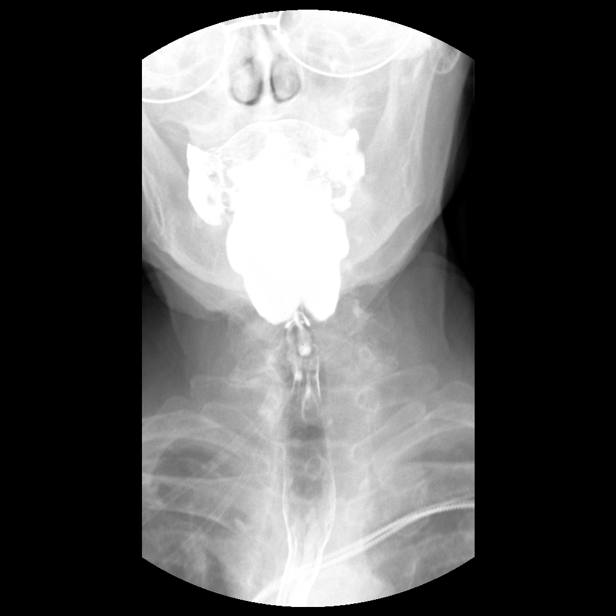
[im 7/7]
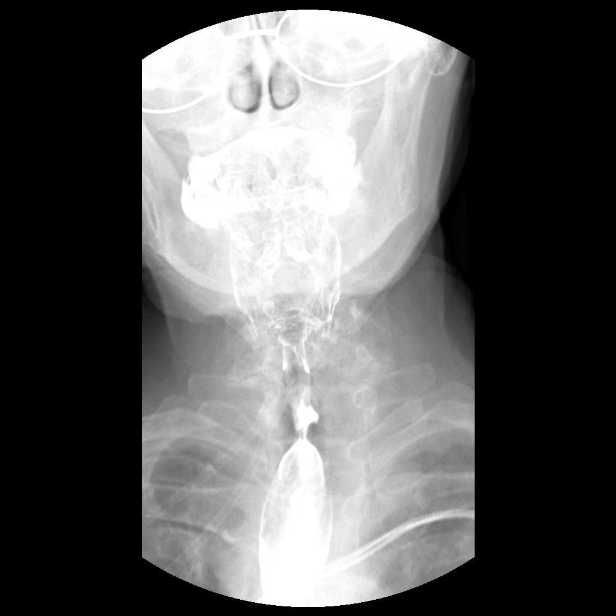

[Series 4: run · 1 of 4 slices shown (4 of 13)]
[im 1/4]
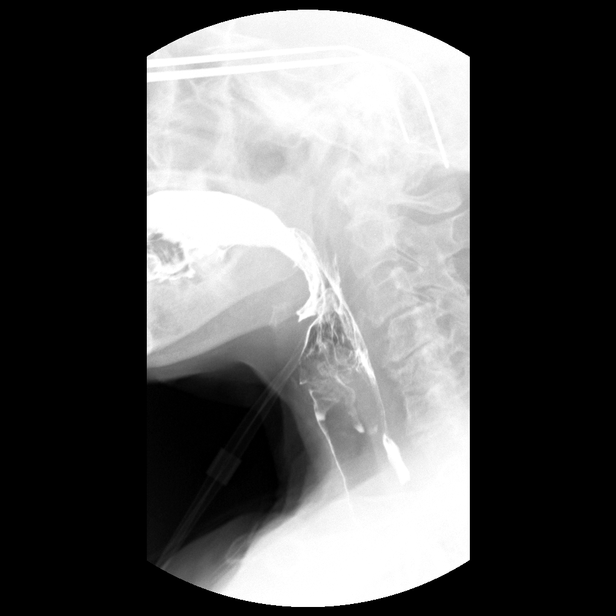

[Series 5: run · 1 of 1 slices shown (5 of 13)]
[im 1/1]
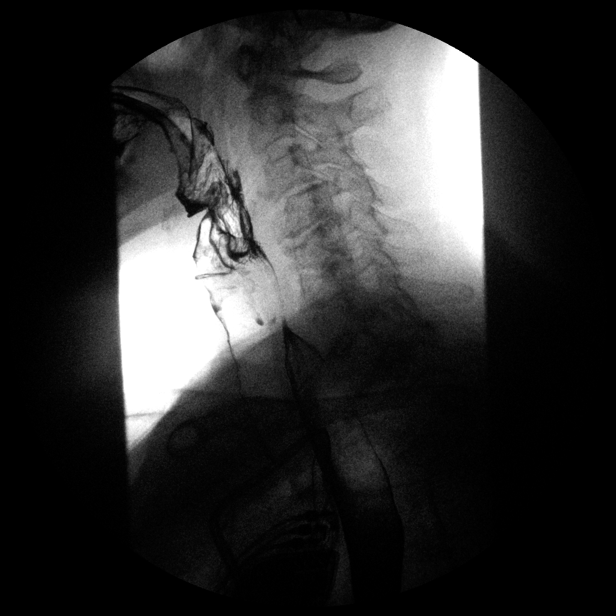

[Series 7: run · 1 of 1 slices shown (6 of 13)]
[im 1/1]
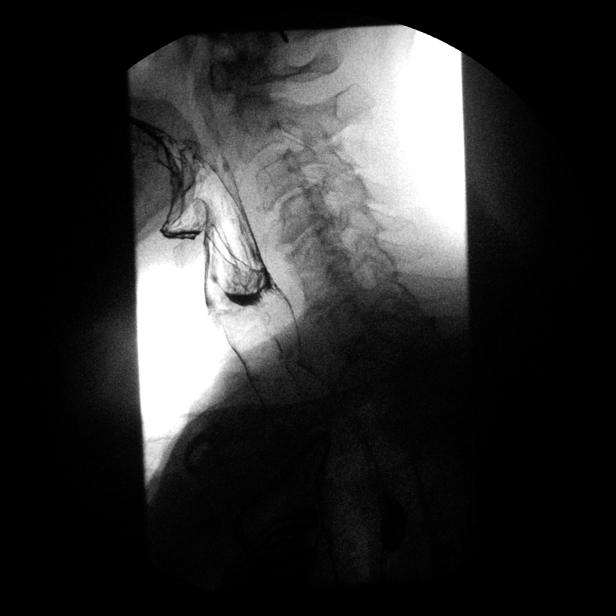

[Series 8: run · 1 of 1 slices shown (7 of 13)]
[im 1/1]
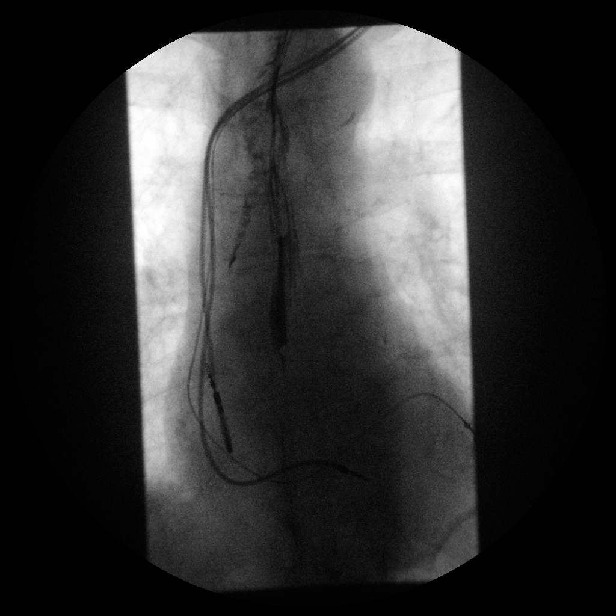

[Series 10: run · 1 of 1 slices shown (8 of 13)]
[im 1/1]
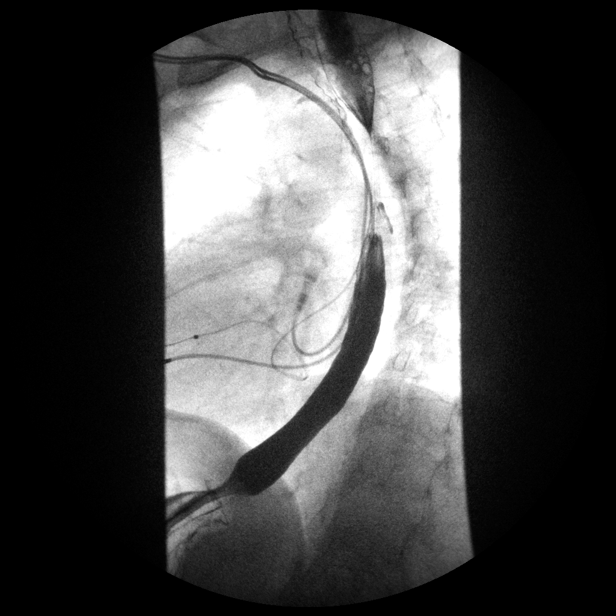

[Series 12: run · 1 of 1 slices shown (9 of 13)]
[im 1/1]
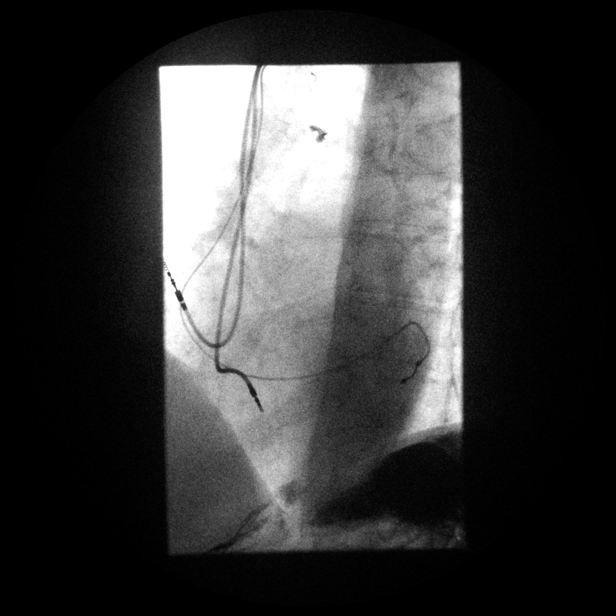

[Series 14: run · 1 of 1 slices shown (10 of 13)]
[im 1/1]
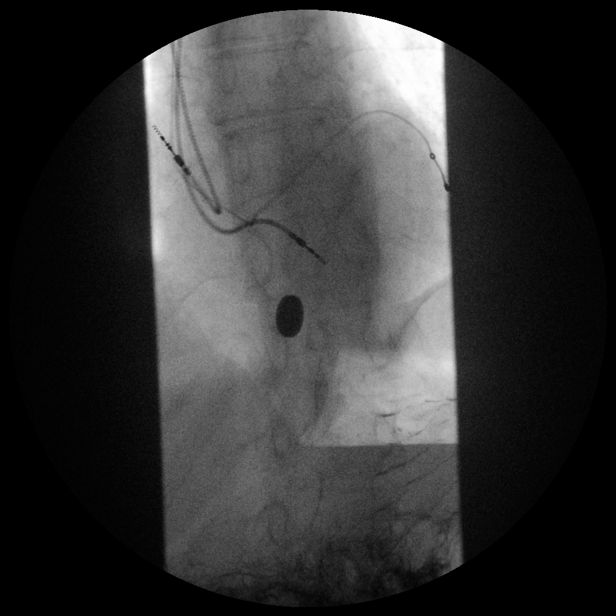

[Series 15: run · 1 of 1 slices shown (11 of 13)]
[im 1/1]
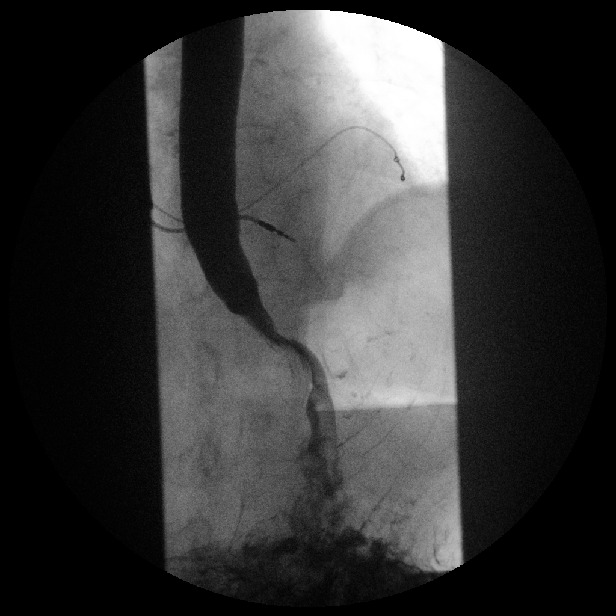

[Series 17: run · 1 of 1 slices shown (12 of 13)]
[im 1/1]
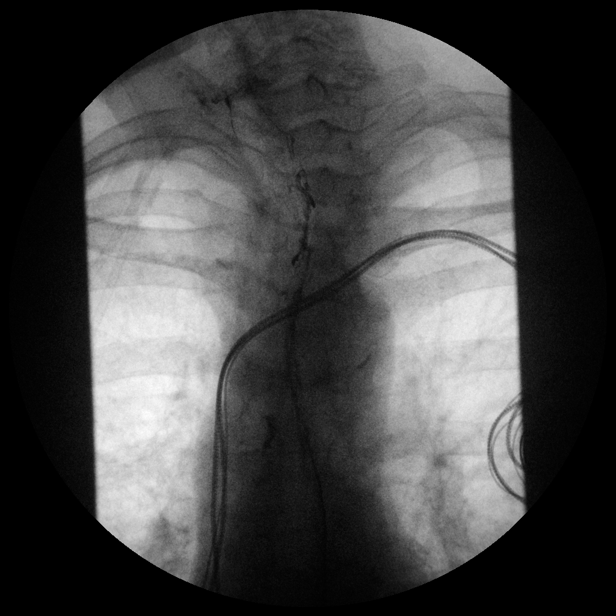

[Series 19: run · 1 of 1 slices shown (13 of 13)]
[im 1/1]
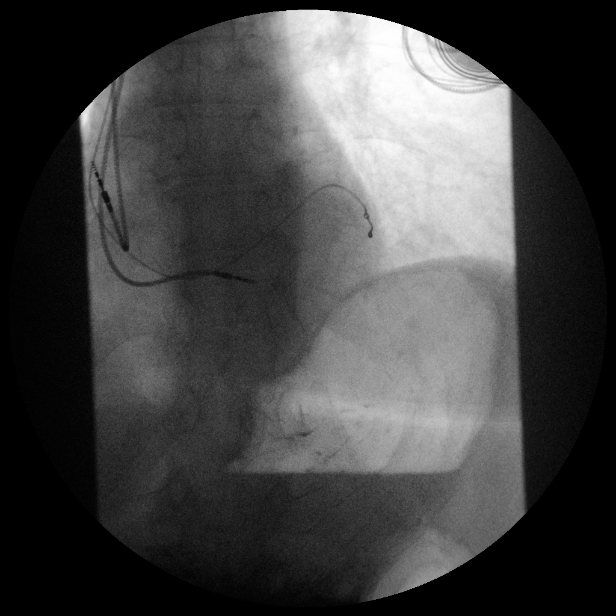

[14 of 24 positions shown; findings below may reference images not displayed]

FINDINGS: On initial evaluation of the esophagus with the patient erect, the
esophageal motility appears within normal limits. There is no
evidence of stricture, mass or ulceration. Rapid sequence imaging of
the pharynx was performed in the AP and lateral projections . When
the patient was turned in the lateral projection, it was noted that
there was aspiration of barium into the trachea. The patient was
asymptomatic. There was only partial clearing of this aspiration
with throat clearing. After the aspiration was visualized, only
minimal additional barium was administered.

In the right anterior oblique position, the esophageal motility
appears normal. No significant reflux was elicited with water siphon
test . A barium tablet was administered and was temporarily (for
approximately 2.5 min) retained within the distal esophagus. The
barium tablet did eventually pass into the stomach .
IMPRESSION: 1. Silent aspiration into the trachea. Consider further evaluation
with dedicated speech therapy.
2. The esophageal motility appears within normal limits. There is no
evidence of stricture or reflux. A barium tablet was temporally
delayed within the distal esophagus, although did pass into the
stomach.

## 2013-11-16 ENCOUNTER — Other Ambulatory Visit: Payer: Self-pay | Admitting: Family Medicine

## 2013-11-19 ENCOUNTER — Telehealth (INDEPENDENT_AMBULATORY_CARE_PROVIDER_SITE_OTHER): Payer: Self-pay

## 2013-11-19 ENCOUNTER — Encounter (INDEPENDENT_AMBULATORY_CARE_PROVIDER_SITE_OTHER): Payer: Medicare Other | Admitting: General Surgery

## 2013-11-19 NOTE — Telephone Encounter (Signed)
ERROR

## 2013-12-01 ENCOUNTER — Telehealth: Payer: Self-pay | Admitting: *Deleted

## 2013-12-01 ENCOUNTER — Ambulatory Visit (INDEPENDENT_AMBULATORY_CARE_PROVIDER_SITE_OTHER): Payer: Medicare Other

## 2013-12-01 DIAGNOSIS — E538 Deficiency of other specified B group vitamins: Secondary | ICD-10-CM

## 2013-12-01 DIAGNOSIS — Z23 Encounter for immunization: Secondary | ICD-10-CM

## 2013-12-01 MED ORDER — CYANOCOBALAMIN 1000 MCG/ML IJ SOLN
1000.0000 ug | Freq: Once | INTRAMUSCULAR | Status: AC
  Administered 2013-12-01: 1000 ug via INTRAMUSCULAR

## 2013-12-01 NOTE — Telephone Encounter (Signed)
Please advise      KP 

## 2013-12-01 NOTE — Telephone Encounter (Signed)
She said she will skip it, she said she just though about it while waiting for her B-12. But she does not have to have it right now.     KP

## 2013-12-01 NOTE — Telephone Encounter (Signed)
Pt would like to get the shingles vaccine when she gets her B12 next month 12/31/2013.  Please advise.

## 2013-12-01 NOTE — Telephone Encounter (Signed)
Medicare will not pay for it in the doctors office --- normally they have to go to pharmacy for it--- did she call ins co?

## 2013-12-10 ENCOUNTER — Telehealth: Payer: Self-pay

## 2013-12-10 MED ORDER — SUVOREXANT 10 MG PO TABS: 1.0000 | ORAL_TABLET | Freq: Every evening | ORAL | Status: DC | PRN

## 2013-12-10 NOTE — Telephone Encounter (Signed)
To MD for review     KP 

## 2013-12-10 NOTE — Telephone Encounter (Signed)
Patient aware of the new Rx and she will pick it up in the office     KP

## 2013-12-10 NOTE — Telephone Encounter (Signed)
[name] [phone] [address])  [name] and New sleeping pill not working, she has been taking 1 whole pill traZODone (DESYREL) 50 MG tablet, she has 4 pills left, wanted to give an honest try before calling.

## 2013-12-10 NOTE — Telephone Encounter (Signed)
Refill x1 

## 2013-12-10 NOTE — Telephone Encounter (Signed)
Refill what because the Trazodone is not working?

## 2013-12-10 NOTE — Telephone Encounter (Signed)
belsomra 10 mg #10 give coupon for 10 free---  If one does not work try 2--- don't use more than 2

## 2013-12-15 ENCOUNTER — Encounter: Payer: Self-pay | Admitting: Internal Medicine

## 2013-12-15 ENCOUNTER — Ambulatory Visit (INDEPENDENT_AMBULATORY_CARE_PROVIDER_SITE_OTHER): Payer: Medicare Other | Admitting: Internal Medicine

## 2013-12-15 VITALS — BP 116/60 | HR 94 | Ht 60.0 in | Wt 102.0 lb

## 2013-12-15 DIAGNOSIS — I428 Other cardiomyopathies: Secondary | ICD-10-CM

## 2013-12-15 DIAGNOSIS — I4891 Unspecified atrial fibrillation: Secondary | ICD-10-CM

## 2013-12-15 DIAGNOSIS — I5022 Chronic systolic (congestive) heart failure: Secondary | ICD-10-CM

## 2013-12-15 DIAGNOSIS — I480 Paroxysmal atrial fibrillation: Secondary | ICD-10-CM

## 2013-12-15 DIAGNOSIS — Z950 Presence of cardiac pacemaker: Secondary | ICD-10-CM

## 2013-12-15 LAB — MDC_IDC_ENUM_SESS_TYPE_INCLINIC
Battery Remaining Longevity: 50 mo
Battery Voltage: 2.98 V
Brady Statistic AP VP Percent: 0 %
Brady Statistic AP VS Percent: 0 %
Brady Statistic AS VP Percent: 99.92 %
Brady Statistic AS VS Percent: 0.07 %
Brady Statistic RA Percent Paced: 0.01 %
Brady Statistic RV Percent Paced: 99.93 %
Date Time Interrogation Session: 20150929155859
Lead Channel Impedance Value: 323 Ohm
Lead Channel Impedance Value: 361 Ohm
Lead Channel Impedance Value: 399 Ohm
Lead Channel Impedance Value: 437 Ohm
Lead Channel Impedance Value: 475 Ohm
Lead Channel Impedance Value: 513 Ohm
Lead Channel Impedance Value: 570 Ohm
Lead Channel Impedance Value: 608 Ohm
Lead Channel Impedance Value: 684 Ohm
Lead Channel Pacing Threshold Amplitude: 0.5 V
Lead Channel Pacing Threshold Amplitude: 0.5 V
Lead Channel Pacing Threshold Amplitude: 1 V
Lead Channel Pacing Threshold Pulse Width: 0.4 ms
Lead Channel Pacing Threshold Pulse Width: 0.4 ms
Lead Channel Pacing Threshold Pulse Width: 0.8 ms
Lead Channel Sensing Intrinsic Amplitude: 0.75 mV
Lead Channel Sensing Intrinsic Amplitude: 0.875 mV
Lead Channel Sensing Intrinsic Amplitude: 7.875 mV
Lead Channel Sensing Intrinsic Amplitude: 8.125 mV
Lead Channel Setting Pacing Amplitude: 1.25 V
Lead Channel Setting Pacing Amplitude: 2 V
Lead Channel Setting Pacing Amplitude: 2.5 V
Lead Channel Setting Pacing Pulse Width: 0.4 ms
Lead Channel Setting Pacing Pulse Width: 0.8 ms
Lead Channel Setting Sensing Sensitivity: 0.9 mV
Zone Setting Detection Interval: 350 ms
Zone Setting Detection Interval: 400 ms

## 2013-12-15 NOTE — Patient Instructions (Signed)
Your physician wants you to follow-up in: [place]. You will receive a reminder letter in the mail two months in advance. If you don't receive a letter, please call our office to schedule the follow-up appointment.  Your physician wants you to follow-up in: [place] Caryl Comes. You will receive a reminder letter in the mail two months in advance. If you don't receive a letter, please call our office to schedule the follow-up appointment.

## 2013-12-15 NOTE — Progress Notes (Signed)
Patient Care Team: Rosalita Chessman, DO as PCP - General Rolm Bookbinder, MD as Consulting Physician (General Surgery) Lafayette Dragon, MD as Consulting Physician (Gastroenterology)   HPI  [name] is a 68 y.o. female seen in followup for congestive heart failure in the setting of nonischemic cardiomyopathy. She underwent CRT-P implantation October 2011. This has been associated with significant improvement although complicated by left upper extremity deep venous thrombosis. She also has infrequent diaphragmatic stimulation this prompted a variety of programming changes   She also has a history of atrial arrhythmias for which she was previously  treated with amiodarone.  She has complaints of occasional floaters.  His oxygen-dependent COPD  Clinically she's been quite stable. She does have cramps in her legs. This is relatively new. Metabolic profile 6/73 was normal; no magnesium was measured   Last echocardiogram demonstrated ejection fracton of 45-50% 1/12   Past Medical History  Diagnosis Date  . GERD (gastroesophageal reflux disease)   . CAD (coronary artery disease)     mild disease per cath in 2011  . Venous embolism and thrombosis of subclavian vein     after pacemaker insertion Oct 2011  . Pleural effusion      s/p right thoracentesis 03/09  . Small bowel obstruction   . Urinary incontinence      -INTERSTIM IMPLANT NOT FUNCTIONING PER PT  . RLS (restless legs syndrome)   . Primary dilated cardiomyopathy     EF 45 to 50% per echo in Jan 2012  . Vitamin B12 deficiency   . Gastroparesis   . Chronic nausea   . Chronic abdominal pain   . Chronic pain syndrome   . Pacemaker     CRT therapy; followed by Dr. Caryl Comes  . H/O: GI bleed     from Pradaxa  . Oxygen dependent     2 liters via nasal cannula at all times  . CHF (congestive heart failure)   . Anemia   . Hx of stress fx aug 2011    right hip   . Elbow fracture, left aug 2012  . Arthritis     right hip  .  Hx of gastric ulcer   . Diverticulitis   . HTN (hypertension)     under control; has been on med. x "years"  . Hx MRSA infection   . Pneumonia - 03/09, 12/09, 02/10, 06/10    MOST RECENT FEB 2013  . Elevated liver enzymes   . Blood transfusion   . Anginal pain   . Shortness of breath   . Fibromyalgia     COUPLE OF TIMES A YEAR-PT HAS EPISODES OF CONFUSION-USUALLY INVOLVES DAY/NIGHT REVERSAL AND EPISODES OF TWITCHING AND FALLS--SEES DR. Jacelyn Grip - NEUROLOGIST-LAST SEEN 07/10/11  . LBBB (left bundle branch block)   . Atrial fibrillation   . COPD (chronic obstructive pulmonary disease)     continuous O2- 2l  . Hepatitis     hx of in high school   . Silent aspiration     Past Surgical History  Procedure Laterality Date  . Appendectomy    . Pacemaker placement  12/21/2009  . Peg removed      with complications  . Tonsillectomy  age 36  . Interstim implant revision  03/06/2011    Procedure: REVISION OF [name];  Surgeon: Reece Packer, MD;  Location: [facility];  Service: Urology;  Laterality: N/A;  Replacement of Neurostimulator  . Portacath placement  12/16/2009  . Total abdominal hysterectomy  complete  . Interstim implant placement  05/28/2006 - stage I    06/05/2006 - stage II  . Interstim implant revision  10/23/2007  . Cystoscopy with injection  04/30/2006    transurethral collagen injection; incision vaginal stenosis  . Small intestine surgery  05/20/2001    ex. lap., resection of small bowel stricture; gastrostomy; insertion central line  . Cardiac catheterization  04/08/2006, 11/16/2009  . Cystoscopy  11/11/2008  . Cystoscopy w/ retrogrades  11/11/2008    right  . Colectomy      Intestinal resection (5times)  . Colectomy  02/04/2000    ex. lap., intra-abd. subtotal colectomy with ileosigmoid colon anastomosies and lysis of adhesions  . Gastrocutaneous fistula closure    . Exploratory laparotomy  04/27/2009    lysis of adhesions, gastrostomy tube  . Port-a-cath removal   07/27/2011    Procedure: REMOVAL PORT-A-CATH;  Surgeon: Rolm Bookbinder, MD;  Location: [facility];  Service: General;  Laterality: N/A;  . Total hip arthroplasty  08/03/2011    Procedure: TOTAL HIP ARTHROPLASTY ANTERIOR APPROACH;  Surgeon: Mcarthur Rossetti, MD;  Location: [facility];  Service: Orthopedics;  Laterality: Right;  . Interstim implant placement  02/05/2012    Procedure: INTERSTIM IMPLANT FIRST STAGE;  Surgeon: Reece Packer, MD;  Location: [facility];  Service: Urology;  Laterality: N/A;  Replacement of Interstim Lead     . Esophagogastroduodenoscopy N/A 02/02/2013    Procedure: ESOPHAGOGASTRODUODENOSCOPY (EGD);  Surgeon: Lafayette Dragon, MD;  Location: [facility];  Service: Endoscopy;  Laterality: N/A;  . Savory dilation N/A 02/02/2013    Procedure: SAVORY DILATION;  Surgeon: Lafayette Dragon, MD;  Location: [facility];  Service: Endoscopy;  Laterality: N/A;    Current Outpatient Prescriptions  Medication Sig Dispense Refill  . albuterol (PROAIR HFA) 108 (90 BASE) MCG/ACT inhaler Inhale 2 puffs into the lungs every 6 (six) hours as needed for wheezing.  1 Inhaler  5  . carvedilol (COREG) 6.25 MG tablet TAKE ONE TABLET BY MOUTH TWICE DAILY WITH MEALS  60 tablet  0  . cyanocobalamin (,VITAMIN B-12,) 1000 MCG/ML injection Inject 1,000 mcg into the muscle every 30 (thirty) days. Labs are due now      . cyclobenzaprine (FLEXERIL) 10 MG tablet TAKE ONE TABLET BY MOUTH THREE TIMES DAILY AS NEEDED FOR MUSCLE SPASM  60 tablet  0  . furosemide (LASIX) 20 MG tablet TAKE ONE TABLET BY MOUTH IN THE MORNING    0  . gabapentin (NEURONTIN) 600 MG tablet TAKE ONE TABLET BY MOUTH 4 TIMES DAILY  120 tablet  2  . loperamide (IMODIUM A-D) 2 MG tablet Take 1/2 tablet by mouth daily as needed for diarrhea.  30 tablet  2  . MYRBETRIQ 25 MG TB24 tablet Take 1 tablet by mouth daily.      . nitroGLYCERIN (NITROSTAT) 0.4 MG SL tablet Place 0.4 mg under the tongue every 5 (five) minutes as  needed. CHEST PAIN      . oxyCODONE-acetaminophen (PERCOCET) 10-325 MG per tablet Take 1 tablet by mouth every 4 (four) hours as needed. PAIN  120 tablet  0  . pantoprazole (PROTONIX) 40 MG tablet Take 1 tablet (40 mg total) by mouth 2 (two) times daily.  60 tablet  2  . promethazine (PHENERGAN) 25 MG tablet Take 1 tablet (25 mg total) by mouth every 8 (eight) hours as needed for nausea.  40 tablet  0  . Suvorexant (BELSOMRA) 10 MG TABS Take 1 tablet by  mouth at bedtime as needed.  10 tablet  0  . tiotropium (SPIRIVA HANDIHALER) 18 MCG inhalation capsule Place 1 capsule (18 mcg total) into inhaler and inhale every morning.  30 capsule  5  . traZODone (DESYREL) 50 MG tablet Take 0.5-1 tablets (25-50 mg total) by mouth at bedtime as needed for sleep.  30 tablet  0  . [DISCONTINUED] iron dextran complex (INFED) 50 MG/ML injection Please give infed infusion (no test dose needed) over 4 hours per pharmacy calculated dose. Ht: 5'2 Wt: 99 pounds Hgb: 12  1 mL  0   No current facility-administered medications for this visit.    Allergies  Allergen Reactions  . Dabigatran Etexilate Mesylate Other (See Comments)    INTERNAL BLEEDING-pradaxa  . Talwin [Pentazocine] Other (See Comments)    hallucinations  . Pentazocine Lactate Other (See Comments)    HALLUCINATIONS  . Sulfonamide Derivatives Other (See Comments)    JITTERINESS     Review of Systems negative except from HPI and PMH  Physical Exam BP 116/60  Pulse 94  Ht 5' (1.524 m)  Wt 102 lb (46.267 kg)  BMI 19.92 kg/m2 Well developed and nourished in no acute distress HENT normal Neck supple with JVP-flat Clear Device pocket well healed; without hematoma or erythema.  There is no tethering cevice with migration into arm pit Regular rate and rhythm, no murmurs or gallops Abd-soft with active BS No Clubbing cyanosis edema Skin-warm and dry A & Oriented  Grossly normal sensory and motor function     Assessment and   Plan Nonischemic cardiomyopathy  Congestive heart failure  Euvolemic continue current meds  Atrial arrhythmia  littlel by device interrogation  Pacemaker-CRT-[employer] The patient's device was interrogated.  The information was reviewed. No changes were made in the programming.     Diaphragmatic stimulation  Euvolemic continue current meds

## 2013-12-18 ENCOUNTER — Other Ambulatory Visit (INDEPENDENT_AMBULATORY_CARE_PROVIDER_SITE_OTHER): Payer: Medicare Other

## 2013-12-18 ENCOUNTER — Ambulatory Visit (INDEPENDENT_AMBULATORY_CARE_PROVIDER_SITE_OTHER): Payer: Medicare Other | Admitting: Internal Medicine

## 2013-12-18 ENCOUNTER — Encounter: Payer: Self-pay | Admitting: Internal Medicine

## 2013-12-18 VITALS — BP 128/66 | HR 64 | Ht 60.0 in | Wt 103.8 lb

## 2013-12-18 DIAGNOSIS — E538 Deficiency of other specified B group vitamins: Secondary | ICD-10-CM

## 2013-12-18 DIAGNOSIS — D509 Iron deficiency anemia, unspecified: Secondary | ICD-10-CM

## 2013-12-18 DIAGNOSIS — K219 Gastro-esophageal reflux disease without esophagitis: Secondary | ICD-10-CM

## 2013-12-18 DIAGNOSIS — Z8719 Personal history of other diseases of the digestive system: Secondary | ICD-10-CM

## 2013-12-18 LAB — CBC WITH DIFFERENTIAL/PLATELET
Basophils Absolute: 0 10*3/uL (ref 0.0–0.1)
Basophils Relative: 0.4 % (ref 0.0–3.0)
Eosinophils Absolute: 0.3 10*3/uL (ref 0.0–0.7)
Eosinophils Relative: 3.6 % (ref 0.0–5.0)
HCT: 31 % — ABNORMAL LOW (ref 36.0–46.0)
Hemoglobin: 10 g/dL — ABNORMAL LOW (ref 12.0–15.0)
Lymphocytes Relative: 11.2 % — ABNORMAL LOW (ref 12.0–46.0)
Lymphs Abs: 0.9 10*3/uL (ref 0.7–4.0)
MCHC: 32.2 g/dL (ref 30.0–36.0)
MCV: 85.5 fl (ref 78.0–100.0)
Monocytes Absolute: 0.7 10*3/uL (ref 0.1–1.0)
Monocytes Relative: 8.5 % (ref 3.0–12.0)
Neutro Abs: 6.1 10*3/uL (ref 1.4–7.7)
Neutrophils Relative %: 76.3 % (ref 43.0–77.0)
Platelets: 382 10*3/uL (ref 150.0–400.0)
RBC: 3.63 Mil/uL — ABNORMAL LOW (ref 3.87–5.11)
RDW: 14.5 % (ref 11.5–15.5)
WBC: 7.9 10*3/uL (ref 4.0–10.5)

## 2013-12-18 LAB — IBC PANEL
Iron: 23 ug/dL — ABNORMAL LOW (ref 42–145)
Saturation Ratios: 7.7 % — ABNORMAL LOW (ref 20.0–50.0)
Transferrin: 212.3 mg/dL (ref 212.0–360.0)

## 2013-12-18 LAB — VITAMIN B12: Vitamin B-12: 430 pg/mL (ref 211–911)

## 2013-12-18 MED ORDER — PANTOPRAZOLE SODIUM 40 MG PO TBEC: 40.0000 mg | DELAYED_RELEASE_TABLET | Freq: Two times a day (BID) | ORAL | Status: DC

## 2013-12-18 MED ORDER — PROMETHAZINE HCL 25 MG PO TABS: 25.0000 mg | ORAL_TABLET | Freq: Three times a day (TID) | ORAL | Status: DC | PRN

## 2013-12-18 NOTE — Patient Instructions (Addendum)
Your physician has requested that you go to the basement for the following lab work before leaving today: CBC, B12, Iron studies  We have sent the following medications to your pharmacy for you to pick up at your convenience: Protonix, Phenergan   I appreciate the opportunity to care for you. Dr Halford Chessman, Dr Caryl Comes,

## 2013-12-18 NOTE — Progress Notes (Signed)
[name] April 15, 1945 [id number]  Note: This dictation was prepared with Dragon digital system. Any transcriptional errors that result from this procedure are unintentional.   History of Present Illness: This is a 68 year old white female with chronic  low grade small bowel obstruction due to adhesive disease. Last office visit 09/29/2013. Her weight was 104 pounds. She has no complaints today. There have been no episodes of small bowel obstruction. She continues to eat small meals at frequent intervals. Her weight today is 103 pounds. Her diarrhea has been under reasonable control. Last exploratory laparotomy in March 2011 was unsuccessful in lysing adhesions. She has a history of GI bleed in association with Pradaxa had  subtotal colectomy for adhesive disease.,20 cm of colon left.Hx iron def requiring Iron infusions.    Past Medical History  Diagnosis Date  . GERD (gastroesophageal reflux disease)   . CAD (coronary artery disease)     mild disease per cath in 2011  . Venous embolism and thrombosis of subclavian vein     after pacemaker insertion Oct 2011  . Pleural effusion      s/p right thoracentesis 03/09  . Small bowel obstruction   . Urinary incontinence      -INTERSTIM IMPLANT NOT FUNCTIONING PER PT  . RLS (restless legs syndrome)   . Primary dilated cardiomyopathy     EF 45 to 50% per echo in Jan 2012  . Vitamin B12 deficiency   . Gastroparesis   . Chronic nausea   . Chronic abdominal pain   . Chronic pain syndrome   . Pacemaker     CRT therapy; followed by Dr. Caryl Comes  . H/O: GI bleed     from Pradaxa  . Oxygen dependent     2 liters via nasal cannula at all times  . CHF (congestive heart failure)   . Anemia   . Hx of stress fx aug 2011    right hip   . Elbow fracture, left aug 2012  . Arthritis     right hip  . Hx of gastric ulcer   . Diverticulitis   . HTN (hypertension)     under control; has been on med. x "years"  . Hx MRSA infection   . Pneumonia -  03/09, 12/09, 02/10, 06/10    MOST RECENT FEB 2013  . Elevated liver enzymes   . Blood transfusion   . Anginal pain   . Shortness of breath   . Fibromyalgia     COUPLE OF TIMES A YEAR-PT HAS EPISODES OF CONFUSION-USUALLY INVOLVES DAY/NIGHT REVERSAL AND EPISODES OF TWITCHING AND FALLS--SEES DR. Jacelyn Grip - NEUROLOGIST-LAST SEEN 07/10/11  . LBBB (left bundle branch block)   . Atrial fibrillation   . COPD (chronic obstructive pulmonary disease)     continuous O2- 2l  . Hepatitis     hx of in high school   . Silent aspiration     Past Surgical History  Procedure Laterality Date  . Appendectomy    . Pacemaker placement  12/21/2009  . Peg removed      with complications  . Tonsillectomy  age 63  . Interstim implant revision  03/06/2011    Procedure: REVISION OF [name];  Surgeon: Reece Packer, MD;  Location: [facility];  Service: Urology;  Laterality: N/A;  Replacement of Neurostimulator  . Portacath placement  12/16/2009  . Total abdominal hysterectomy      complete  . Interstim implant placement  05/28/2006 - stage I    06/05/2006 - stage  II  . Interstim implant revision  10/23/2007  . Cystoscopy with injection  04/30/2006    transurethral collagen injection; incision vaginal stenosis  . Small intestine surgery  05/20/2001    ex. lap., resection of small bowel stricture; gastrostomy; insertion central line  . Cardiac catheterization  04/08/2006, 11/16/2009  . Cystoscopy  11/11/2008  . Cystoscopy w/ retrogrades  11/11/2008    right  . Colectomy      Intestinal resection (5times)  . Colectomy  02/04/2000    ex. lap., intra-abd. subtotal colectomy with ileosigmoid colon anastomosies and lysis of adhesions  . Gastrocutaneous fistula closure    . Exploratory laparotomy  04/27/2009    lysis of adhesions, gastrostomy tube  . Port-a-cath removal  07/27/2011    Procedure: REMOVAL PORT-A-CATH;  Surgeon: Rolm Bookbinder, MD;  Location: [facility];  Service: General;  Laterality: N/A;   . Total hip arthroplasty  08/03/2011    Procedure: TOTAL HIP ARTHROPLASTY ANTERIOR APPROACH;  Surgeon: Mcarthur Rossetti, MD;  Location: [facility];  Service: Orthopedics;  Laterality: Right;  . Interstim implant placement  02/05/2012    Procedure: INTERSTIM IMPLANT FIRST STAGE;  Surgeon: Reece Packer, MD;  Location: [facility];  Service: Urology;  Laterality: N/A;  Replacement of Interstim Lead     . Esophagogastroduodenoscopy N/A 02/02/2013    Procedure: ESOPHAGOGASTRODUODENOSCOPY (EGD);  Surgeon: Lafayette Dragon, MD;  Location: [facility];  Service: Endoscopy;  Laterality: N/A;  . Savory dilation N/A 02/02/2013    Procedure: SAVORY DILATION;  Surgeon: Lafayette Dragon, MD;  Location: [facility];  Service: Endoscopy;  Laterality: N/A;    Allergies  Allergen Reactions  . Dabigatran Etexilate Mesylate Other (See Comments)    INTERNAL BLEEDING-pradaxa  . Talwin [Pentazocine] Other (See Comments)    hallucinations  . Pentazocine Lactate Other (See Comments)    HALLUCINATIONS  . Sulfonamide Derivatives Other (See Comments)    JITTERINESS     Family history and social history have been reviewed.  Review of Systems: Denies nausea vomiting diarrhea  The remainder of the 10 point ROS is negative except as outlined in the H&P  Physical Exam: General Appearance  nasal oxygen ,in no distressChronically ill appearing  Eyes  Non icteric  HEENT  Non traumatic, normocephalic  Mouth No lesion, tongue papillated, no cheilosis Neck Supple without adenopathy, thyroid not enlarged, no carotid bruits, no JVD Lungs Clear to auscultation bilaterally COR Normal S1, normal S2, regular rhythm, no murmur, quiet precordium Abdomen Hyperactive bowel sounds. Tight abdomen with multiple scars. No tenderness positive succussion splash  Rectal Not done  Extremities  No pedal edema Skin No lesions, palmar erythema  Neurological Alert and oriented x 3 Psychological Normal mood and affect  Assessment  and Plan:   68 year old white female with stable low-grade small bowel obstruction due to adhesive disease. Weight has been stable. No episodes of obstruction. We will refill Protonix and Phenergan. We will check CBC, B12 and iron studies. I will see her in 3 months    Delfin Edis 12/18/2013

## 2013-12-21 ENCOUNTER — Telehealth: Payer: Self-pay | Admitting: Family Medicine

## 2013-12-21 ENCOUNTER — Telehealth: Payer: Self-pay | Admitting: Internal Medicine

## 2013-12-21 ENCOUNTER — Other Ambulatory Visit: Payer: Self-pay | Admitting: *Deleted

## 2013-12-21 DIAGNOSIS — D509 Iron deficiency anemia, unspecified: Secondary | ICD-10-CM

## 2013-12-21 DIAGNOSIS — R52 Pain, unspecified: Secondary | ICD-10-CM

## 2013-12-21 MED ORDER — SUVOREXANT 10 MG PO TABS: 1.0000 | ORAL_TABLET | Freq: Every evening | ORAL | Status: DC | PRN

## 2013-12-21 MED ORDER — OXYCODONE-ACETAMINOPHEN 10-325 MG PO TABS: 1.0000 | ORAL_TABLET | ORAL | Status: DC | PRN

## 2013-12-21 NOTE — Telephone Encounter (Signed)
Last seen 07/20/13   Oxycodone filled 11/03/13 #120.   Belsomra filled 12/10/13 10 mg pt was instructed to take 2 and #10 was given for the free trial.  UDS 01/09/13 low risk   Dr.Lowne's patient. Please advise       KP

## 2013-12-21 NOTE — Telephone Encounter (Signed)
OK to print the Oxycodone and I will sign. If she wants the Belsomra I am willing to write 10 mg tab, 1 tab po qhs, disp #30.

## 2013-12-21 NOTE — Telephone Encounter (Signed)
Caller name: [name] Relation to pt: self  Call back number: [phone] Pharmacy:   Reason for call:   Patient is requesting a new rx of oxycodone.  Patient states that she is now taking two pills daily of belsomra(she states that dr Etter Sjogren told her that this is okay).  Patient would like to pick up both prescriptions

## 2013-12-21 NOTE — Telephone Encounter (Signed)
See results notes. 

## 2013-12-21 NOTE — Telephone Encounter (Signed)
Patient aware Rx ready for pick up.      KP 

## 2013-12-23 ENCOUNTER — Telehealth: Payer: Self-pay | Admitting: Family Medicine

## 2013-12-23 NOTE — Telephone Encounter (Signed)
PA for Belsomra initiated on [email or web address]. Awaiting determination. JG//CMA

## 2013-12-23 NOTE — Telephone Encounter (Signed)
I just spoke with patient and she stated that Belsomra was approved by her insurance, but she is still responsible to pay $121 out of pocket. She would like to go back to Ambien 5mg  if that's ok. JG//CMA  Pharmacy: [facility] [place]

## 2013-12-23 NOTE — Telephone Encounter (Signed)
Please advise to my note below

## 2013-12-23 NOTE — Telephone Encounter (Signed)
I got message. It appears Dr and pt want to use belsomra if covered. So you can print out rx for #30 tabs and I will sign.

## 2013-12-23 NOTE — Telephone Encounter (Signed)
Caller name: Corin Relation to pt: self Call back number: [phone] Pharmacy: [facility] [address]  Reason for call:   Patient states that the prescription that was prescribed for sleeping is to expensive, she would like to know if she could try [place] again.

## 2013-12-23 NOTE — Telephone Encounter (Signed)
°  Caller name: [employer]  Relation to pt: other  Reason for call:    [employer] called stating insurance will cover Suvorexant (BELSOMRA) 10 MG TABS.

## 2013-12-23 NOTE — Telephone Encounter (Signed)
Spoke with patient and the [place] worked well but it is $300 dollars and she would rather switch back to the Ambien. She has tried Ambien 5 mg. Please advise in Dr.Lowne's absence.      KP

## 2013-12-23 NOTE — Telephone Encounter (Signed)
You could give 30 tabs of ambien  5mg  dose. 1 q hs prn insomnia. No refills (Encourage not to use every day but every other 2-3 days.) Her insurance problably does not want to pay for ambien.[name]

## 2013-12-24 ENCOUNTER — Other Ambulatory Visit: Payer: Self-pay

## 2013-12-24 MED ORDER — GABAPENTIN 600 MG PO TABS: ORAL_TABLET | ORAL | Status: DC

## 2013-12-24 MED ORDER — ZOLPIDEM TARTRATE 5 MG PO TABS: 5.0000 mg | ORAL_TABLET | Freq: Every evening | ORAL | Status: DC | PRN

## 2013-12-24 NOTE — Telephone Encounter (Signed)
Script printed, signed by Percell Miller and faxed to [facility] at [phone]. JG//CMA

## 2013-12-29 ENCOUNTER — Other Ambulatory Visit: Payer: Self-pay

## 2013-12-29 MED ORDER — GABAPENTIN 600 MG PO TABS: ORAL_TABLET | ORAL | Status: DC

## 2013-12-31 ENCOUNTER — Telehealth: Payer: Self-pay | Admitting: Cardiology

## 2013-12-31 ENCOUNTER — Ambulatory Visit (INDEPENDENT_AMBULATORY_CARE_PROVIDER_SITE_OTHER): Payer: Medicare Other | Admitting: *Deleted

## 2013-12-31 DIAGNOSIS — E538 Deficiency of other specified B group vitamins: Secondary | ICD-10-CM

## 2013-12-31 DIAGNOSIS — Z23 Encounter for immunization: Secondary | ICD-10-CM

## 2013-12-31 NOTE — Telephone Encounter (Signed)
Pt called in stating that Dr. Percival Spanish decreased her dosage of furosemide to 20mg  which did not seem to work for her. She would like to go back to taking 40mg . She would like the new prescription sent to the [facility] on [address]. And [facility]. Please call  Thanks

## 2013-12-31 NOTE — Telephone Encounter (Signed)
OK to take 40 mg.  Please send prescription for Lasix 40 mg po daily.  Disp number 90 with 3 refills

## 2014-01-01 ENCOUNTER — Encounter (HOSPITAL_COMMUNITY): Payer: Self-pay

## 2014-01-01 ENCOUNTER — Encounter (HOSPITAL_COMMUNITY)
Admission: RE | Admit: 2014-01-01 | Discharge: 2014-01-01 | Disposition: A | Discharge status: Home / Self Care | Payer: Medicare Other | Source: Ambulatory Visit | Attending: Internal Medicine | Admitting: Internal Medicine

## 2014-01-01 VITALS — BP 130/66 | HR 84 | Temp 98.1°F | Resp 20 | Ht 60.0 in | Wt 104.0 lb

## 2014-01-01 DIAGNOSIS — I4891 Unspecified atrial fibrillation: Secondary | ICD-10-CM | POA: Diagnosis not present

## 2014-01-01 DIAGNOSIS — I2510 Atherosclerotic heart disease of native coronary artery without angina pectoris: Secondary | ICD-10-CM | POA: Diagnosis not present

## 2014-01-01 DIAGNOSIS — D509 Iron deficiency anemia, unspecified: Secondary | ICD-10-CM

## 2014-01-01 DIAGNOSIS — K565 Intestinal adhesions [bands] with obstruction (postprocedural) (postinfection): Secondary | ICD-10-CM | POA: Insufficient documentation

## 2014-01-01 DIAGNOSIS — J449 Chronic obstructive pulmonary disease, unspecified: Secondary | ICD-10-CM | POA: Insufficient documentation

## 2014-01-01 DIAGNOSIS — I509 Heart failure, unspecified: Secondary | ICD-10-CM | POA: Diagnosis not present

## 2014-01-01 DIAGNOSIS — I97131 Postprocedural heart failure following other surgery: Secondary | ICD-10-CM | POA: Insufficient documentation

## 2014-01-01 DIAGNOSIS — J45909 Unspecified asthma, uncomplicated: Secondary | ICD-10-CM | POA: Insufficient documentation

## 2014-01-01 DIAGNOSIS — K219 Gastro-esophageal reflux disease without esophagitis: Secondary | ICD-10-CM | POA: Insufficient documentation

## 2014-01-01 DIAGNOSIS — Z882 Allergy status to sulfonamides status: Secondary | ICD-10-CM | POA: Diagnosis not present

## 2014-01-01 DIAGNOSIS — E538 Deficiency of other specified B group vitamins: Secondary | ICD-10-CM | POA: Insufficient documentation

## 2014-01-01 MED ORDER — SODIUM CHLORIDE 0.9 % IV SOLN
INTRAVENOUS | Status: DC
  Administered 2014-01-01: 14:00:00 via INTRAVENOUS

## 2014-01-01 MED ORDER — SODIUM CHLORIDE 0.9 % IV SOLN
510.0000 mg | Freq: Once | INTRAVENOUS | Status: AC
  Administered 2014-01-01: 510 mg via INTRAVENOUS
  Filled 2014-01-01: qty 17

## 2014-01-01 NOTE — Discharge Instructions (Signed)

## 2014-01-04 ENCOUNTER — Other Ambulatory Visit: Payer: Self-pay | Admitting: *Deleted

## 2014-01-04 ENCOUNTER — Other Ambulatory Visit: Payer: Self-pay

## 2014-01-04 MED ORDER — FUROSEMIDE 40 MG PO TABS: ORAL_TABLET | ORAL | Status: DC

## 2014-01-04 NOTE — Telephone Encounter (Signed)
Med refilled at 40 mg #90 with 3 refills

## 2014-01-08 ENCOUNTER — Encounter (HOSPITAL_COMMUNITY): Payer: Self-pay

## 2014-01-08 ENCOUNTER — Encounter (HOSPITAL_COMMUNITY)
Admission: RE | Admit: 2014-01-08 | Discharge: 2014-01-08 | Disposition: A | Discharge status: Home / Self Care | Payer: Medicare Other | Source: Ambulatory Visit | Attending: Internal Medicine | Admitting: Internal Medicine

## 2014-01-08 VITALS — BP 105/59 | HR 87 | Temp 98.9°F | Resp 20

## 2014-01-08 DIAGNOSIS — D509 Iron deficiency anemia, unspecified: Secondary | ICD-10-CM

## 2014-01-08 DIAGNOSIS — K565 Intestinal adhesions [bands] with obstruction (postprocedural) (postinfection): Secondary | ICD-10-CM | POA: Diagnosis not present

## 2014-01-08 MED ORDER — SODIUM CHLORIDE 0.9 % IV SOLN
INTRAVENOUS | Status: DC
  Administered 2014-01-08: 14:00:00 via INTRAVENOUS

## 2014-01-08 MED ORDER — SODIUM CHLORIDE 0.9 % IV SOLN
510.0000 mg | Freq: Once | INTRAVENOUS | Status: AC
  Administered 2014-01-08: 510 mg via INTRAVENOUS
  Filled 2014-01-08: qty 17

## 2014-01-13 ENCOUNTER — Other Ambulatory Visit: Payer: Self-pay

## 2014-01-13 MED ORDER — CYCLOBENZAPRINE HCL 10 MG PO TABS: ORAL_TABLET | ORAL | Status: DC

## 2014-01-21 ENCOUNTER — Telehealth: Payer: Self-pay | Admitting: Family Medicine

## 2014-01-21 MED ORDER — ZOLPIDEM TARTRATE 5 MG PO TABS: 5.0000 mg | ORAL_TABLET | Freq: Every evening | ORAL | Status: DC | PRN

## 2014-01-21 NOTE — Telephone Encounter (Signed)
Rx faxed.    KP 

## 2014-01-21 NOTE — Telephone Encounter (Signed)
Ambien request  Last seen 07/20/13 and filled 12/24/13 #30  Please advise     KP

## 2014-01-21 NOTE — Telephone Encounter (Signed)
Caller name: [name] Relation to pt: self Call back number: [phone] Pharmacy[facility] [address]  Reason for call:   Patient is requesting a refill of zolpidem

## 2014-01-21 NOTE — Telephone Encounter (Signed)
Refill x1   2 refills

## 2014-01-28 ENCOUNTER — Telehealth: Payer: Self-pay | Admitting: *Deleted

## 2014-01-28 ENCOUNTER — Ambulatory Visit (INDEPENDENT_AMBULATORY_CARE_PROVIDER_SITE_OTHER): Payer: Medicare Other

## 2014-01-28 DIAGNOSIS — E538 Deficiency of other specified B group vitamins: Secondary | ICD-10-CM

## 2014-01-28 DIAGNOSIS — R52 Pain, unspecified: Secondary | ICD-10-CM

## 2014-01-28 MED ORDER — OXYCODONE-ACETAMINOPHEN 10-325 MG PO TABS: 1.0000 | ORAL_TABLET | ORAL | Status: DC | PRN

## 2014-01-28 MED ORDER — CYANOCOBALAMIN 1000 MCG/ML IJ SOLN
1000.0000 ug | Freq: Once | INTRAMUSCULAR | Status: AC
  Administered 2014-01-28: 1000 ug via INTRAMUSCULAR

## 2014-01-28 NOTE — Telephone Encounter (Signed)
Pt has 6 month follow up Monday, 02/01/2014 and will get prescription that day.

## 2014-01-28 NOTE — Telephone Encounter (Signed)
Caller name: Kenedy Relation to pt: self Call back number: [phone] Pharmacy:  Reason for call: Pt requesting refill on  oxyCODONE-acetaminophen (PERCOCET) 10-325 MG per tablet  Last written 12/21/2013, #120, no refills Last OV 07/20/2013

## 2014-01-28 NOTE — Telephone Encounter (Signed)
Ok to refill x 1  

## 2014-01-28 NOTE — Progress Notes (Signed)
Patient tolerated injection well. No adverse reactions.

## 2014-01-28 NOTE — Telephone Encounter (Signed)
Please advise      KP 

## 2014-02-01 ENCOUNTER — Telehealth: Payer: Self-pay | Admitting: Internal Medicine

## 2014-02-01 ENCOUNTER — Ambulatory Visit (INDEPENDENT_AMBULATORY_CARE_PROVIDER_SITE_OTHER): Payer: Medicare Other | Admitting: Family Medicine

## 2014-02-01 ENCOUNTER — Encounter: Payer: Self-pay | Admitting: Family Medicine

## 2014-02-01 VITALS — BP 132/80 | HR 83 | Temp 98.3°F | Wt 101.0 lb

## 2014-02-01 DIAGNOSIS — E538 Deficiency of other specified B group vitamins: Secondary | ICD-10-CM

## 2014-02-01 DIAGNOSIS — I10 Essential (primary) hypertension: Secondary | ICD-10-CM

## 2014-02-01 DIAGNOSIS — R52 Pain, unspecified: Secondary | ICD-10-CM

## 2014-02-01 DIAGNOSIS — D509 Iron deficiency anemia, unspecified: Secondary | ICD-10-CM

## 2014-02-01 DIAGNOSIS — J438 Other emphysema: Secondary | ICD-10-CM

## 2014-02-01 DIAGNOSIS — R252 Cramp and spasm: Secondary | ICD-10-CM

## 2014-02-01 MED ORDER — OXYCODONE-ACETAMINOPHEN 10-325 MG PO TABS: 1.0000 | ORAL_TABLET | ORAL | Status: DC | PRN

## 2014-02-01 NOTE — Progress Notes (Signed)
Pre visit review using our clinic review tool, if applicable. No additional management support is needed unless otherwise documented below in the visit note. 

## 2014-02-01 NOTE — Telephone Encounter (Signed)
Patient is having a f/u OV Jan. She is asking for labs to be done prior to OV. Please, advise.

## 2014-02-01 NOTE — Patient Instructions (Signed)

## 2014-02-01 NOTE — Telephone Encounter (Signed)
Jan labs CBC,C-met, Iron,TIBC, B12,. Also , please set up Iron infusion in next couple of weeks. Marisue Brooklyn) x2

## 2014-02-01 NOTE — Progress Notes (Signed)
Subjective:    Patient here for follow-up of elevated blood pressure.  She is not exercising and is adherent to a low-salt diet.  Blood pressure is well controlled at home. Cardiac symptoms: none. Patient denies: chest pain, chest pressure/discomfort, claudication, dyspnea, exertional chest pressure/discomfort, irregular heart beat, lower extremity edema, near-syncope, palpitations, paroxysmal nocturnal dyspnea, syncope and tachypnea. Cardiovascular risk factors: advanced age (older than 39 for men, 46 for women), hypertension and sedentary lifestyle. Use of agents associated with hypertension: none. History of target organ damage: none.  The following portions of the patient's history were reviewed and updated as appropriate:  She  has a past medical history of GERD (gastroesophageal reflux disease); CAD (coronary artery disease); Venous embolism and thrombosis of subclavian vein; Pleural effusion; Small bowel obstruction; Urinary incontinence; RLS (restless legs syndrome); Primary dilated cardiomyopathy; Vitamin B12 deficiency; Gastroparesis; Chronic nausea; Chronic abdominal pain; Chronic pain syndrome; Pacemaker; H/O: GI bleed; Oxygen dependent; CHF (congestive heart failure); Anemia; stress fx (aug 2011); Elbow fracture, left (aug 2012); Arthritis; gastric ulcer; Diverticulitis; HTN (hypertension); MRSA infection; Pneumonia (- 03/09, 12/09, 02/10, 06/10); Elevated liver enzymes; Blood transfusion; Anginal pain; Shortness of breath; Fibromyalgia; LBBB (left bundle branch block); Atrial fibrillation; COPD (chronic obstructive pulmonary disease); Hepatitis; and Silent aspiration. She  does not have any pertinent problems on file. She  has past surgical history that includes Appendectomy; pacemaker placement (12/21/2009); Peg removed; Tonsillectomy (age 29); Interstim implant revision (03/06/2011); Portacath placement (12/16/2009); Total abdominal hysterectomy; Interstim Implant placement (05/28/2006 - stage  I); Interstim implant revision (10/23/2007); Cystoscopy with injection (04/30/2006); Small intestine surgery (05/20/2001); Cardiac catheterization (04/08/2006, 11/16/2009); Cystoscopy (11/11/2008); Cystoscopy w/ retrogrades (11/11/2008); Colectomy; Colectomy (02/04/2000); Gastrocutaneous fistula closure; Exploratory laparotomy (04/27/2009); Port-a-cath removal (07/27/2011); Total hip arthroplasty (08/03/2011); Interstim Implant placement (02/05/2012); Esophagogastroduodenoscopy (N/A, 02/02/2013); and Savory dilation (N/A, 02/02/2013). Her family history includes Breast cancer in her maternal aunt; Diabetes in her paternal grandfather; Heart disease in her brother, father, mother, and paternal aunt; Stroke in her mother. There is no history of Colon cancer. She  reports that she quit smoking about 27 years ago. She has never used smokeless tobacco. She reports that she does not drink alcohol or use illicit drugs. She has a current medication list which includes the following prescription(s): albuterol, carvedilol, cyclobenzaprine, furosemide, gabapentin, loperamide, myrbetriq, nitroglycerin, oxycodone-acetaminophen, pantoprazole, promethazine, tiotropium, and zolpidem. Current Outpatient Prescriptions on File Prior to Visit  Medication Sig Dispense Refill  . albuterol (PROAIR HFA) 108 (90 BASE) MCG/ACT inhaler Inhale 2 puffs into the lungs every 6 (six) hours as needed for wheezing. 1 Inhaler 5  . carvedilol (COREG) 6.25 MG tablet TAKE ONE TABLET BY MOUTH TWICE DAILY WITH MEALS 60 tablet 0  . cyclobenzaprine (FLEXERIL) 10 MG tablet TAKE ONE TABLET BY MOUTH THREE TIMES DAILY AS NEEDED FOR MUSCLE SPASM 60 tablet 0  . furosemide (LASIX) 40 MG tablet TAKE ONE TABLET BY MOUTH IN THE MORNING 90 tablet 3  . gabapentin (NEURONTIN) 600 MG tablet TAKE ONE TABLET BY MOUTH 4 TIMES DAILY 120 tablet 2  . loperamide (IMODIUM A-D) 2 MG tablet Take 1/2 tablet by mouth daily as needed for diarrhea. 30 tablet 2  . MYRBETRIQ 25 MG TB24  tablet Take 1 tablet by mouth daily.    . nitroGLYCERIN (NITROSTAT) 0.4 MG SL tablet Place 0.4 mg under the tongue every 5 (five) minutes as needed. CHEST PAIN    . pantoprazole (PROTONIX) 40 MG tablet Take 1 tablet (40 mg total) by mouth 2 (two) times daily. 60 tablet  2  . promethazine (PHENERGAN) 25 MG tablet Take 1 tablet (25 mg total) by mouth every 8 (eight) hours as needed for nausea. 40 tablet 0  . tiotropium (SPIRIVA HANDIHALER) 18 MCG inhalation capsule Place 1 capsule (18 mcg total) into inhaler and inhale every morning. 30 capsule 5  . zolpidem (AMBIEN) 5 MG tablet Take 1 tablet (5 mg total) by mouth at bedtime as needed for sleep. 30 tablet 2  . [DISCONTINUED] iron dextran complex (INFED) 50 MG/ML injection Please give infed infusion (no test dose needed) over 4 hours per pharmacy calculated dose. Ht: 5'2 Wt: 99 pounds Hgb: 12 1 mL 0   No current facility-administered medications on file prior to visit.   She is allergic to dabigatran etexilate mesylate; talwin; pentazocine lactate; and sulfonamide derivatives..  Review of Systems Pertinent items are noted in HPI.     Objective:    BP 132/80 mmHg  Pulse 83  Temp(Src) 98.3 F (36.8 C) (Oral)  Wt 101 lb (45.813 kg)  SpO2 99% General appearance: alert, cooperative, appears stated age and no distress Neck: no adenopathy, no JVD, supple, symmetrical, trachea midline and thyroid not enlarged, symmetric, no tenderness/mass/nodules Lungs: diminished breath sounds bilaterally Heart: S1, S2 normal Extremities: extremities normal, atraumatic, no cyanosis or edema    Assessment:    Hypertension, normal blood pressure  . Evidence of target organ damage: none.    Plan:    Medication: no change. Dietary sodium restriction. Follow up: 6 months and as needed.    1. Cramp of both lower extremities Check labs,  Pt will try calcium , bananas, tonic water in mean time - Basic metabolic panel  2. Pain   -  oxyCODONE-acetaminophen (PERCOCET) 10-325 MG per tablet; Take 1 tablet by mouth every 4 (four) hours as needed. PAIN  Dispense: 120 tablet; Refill: 0  3. Essential hypertension Stable, cont meds   4. Other emphysema    5. B12 deficiency con't b12 injections monthly

## 2014-02-02 NOTE — Telephone Encounter (Signed)
Labs in EPIC. Patient has already received Feraheme. Dr. Olevia Perches aware. Patient aware.

## 2014-02-08 ENCOUNTER — Other Ambulatory Visit: Payer: Self-pay | Admitting: Family Medicine

## 2014-02-08 ENCOUNTER — Telehealth: Payer: Self-pay | Admitting: Pulmonary Disease

## 2014-02-08 MED ORDER — ALBUTEROL SULFATE HFA 108 (90 BASE) MCG/ACT IN AERS: 2.0000 | INHALATION_SPRAY | Freq: Four times a day (QID) | RESPIRATORY_TRACT | Status: DC | PRN

## 2014-02-08 NOTE — Telephone Encounter (Signed)
Called pt and is aware RX sent in

## 2014-02-17 ENCOUNTER — Other Ambulatory Visit (INDEPENDENT_AMBULATORY_CARE_PROVIDER_SITE_OTHER): Payer: Medicare Other

## 2014-02-17 DIAGNOSIS — D509 Iron deficiency anemia, unspecified: Secondary | ICD-10-CM

## 2014-02-17 LAB — CBC WITH DIFFERENTIAL/PLATELET
Basophils Absolute: 0 10*3/uL (ref 0.0–0.1)
Basophils Relative: 0.5 % (ref 0.0–3.0)
Eosinophils Absolute: 0.2 10*3/uL (ref 0.0–0.7)
Eosinophils Relative: 2.6 % (ref 0.0–5.0)
HCT: 32 % — ABNORMAL LOW (ref 36.0–46.0)
Hemoglobin: 10.1 g/dL — ABNORMAL LOW (ref 12.0–15.0)
Lymphocytes Relative: 9.7 % — ABNORMAL LOW (ref 12.0–46.0)
Lymphs Abs: 0.8 10*3/uL (ref 0.7–4.0)
MCHC: 31.7 g/dL (ref 30.0–36.0)
MCV: 88 fl (ref 78.0–100.0)
Monocytes Absolute: 0.6 10*3/uL (ref 0.1–1.0)
Monocytes Relative: 8 % (ref 3.0–12.0)
Neutro Abs: 6.3 10*3/uL (ref 1.4–7.7)
Neutrophils Relative %: 79.2 % — ABNORMAL HIGH (ref 43.0–77.0)
Platelets: 331 10*3/uL (ref 150.0–400.0)
RBC: 3.64 Mil/uL — ABNORMAL LOW (ref 3.87–5.11)
RDW: 15 % (ref 11.5–15.5)
WBC: 7.9 10*3/uL (ref 4.0–10.5)

## 2014-02-18 LAB — COMPREHENSIVE METABOLIC PANEL
ALT: 17 U/L (ref 0–35)
AST: 25 U/L (ref 0–37)
Albumin: 3.3 g/dL — ABNORMAL LOW (ref 3.5–5.2)
Alkaline Phosphatase: 87 U/L (ref 39–117)
BUN: 18 mg/dL (ref 6–23)
CO2: 30 mEq/L (ref 19–32)
Calcium: 7.9 mg/dL — ABNORMAL LOW (ref 8.4–10.5)
Chloride: 100 mEq/L (ref 96–112)
Creatinine, Ser: 0.9 mg/dL (ref 0.4–1.2)
GFR: 68.56 mL/min (ref >60.00)
Glucose, Bld: 92 mg/dL (ref 70–99)
Potassium: 4 mEq/L (ref 3.5–5.1)
Sodium: 134 mEq/L — ABNORMAL LOW (ref 135–145)
Total Bilirubin: 0.4 mg/dL (ref 0.2–1.2)
Total Protein: 5.9 g/dL — ABNORMAL LOW (ref 6.0–8.3)

## 2014-02-18 LAB — IBC PANEL
Iron: 31 ug/dL — ABNORMAL LOW (ref 42–145)
Saturation Ratios: 11.6 % — ABNORMAL LOW (ref 20.0–50.0)
Transferrin: 191.4 mg/dL — ABNORMAL LOW (ref 212.0–360.0)

## 2014-02-18 LAB — IRON AND TIBC
%SAT: 12 % — ABNORMAL LOW (ref 20–55)
Iron: 32 ug/dL — ABNORMAL LOW (ref 42–145)
TIBC: 261 ug/dL (ref 250–470)
UIBC: 229 ug/dL (ref 125–400)

## 2014-02-18 LAB — VITAMIN B12: Vitamin B-12: 484 pg/mL (ref 211–911)

## 2014-02-19 ENCOUNTER — Other Ambulatory Visit: Payer: Self-pay | Admitting: *Deleted

## 2014-02-19 DIAGNOSIS — D509 Iron deficiency anemia, unspecified: Secondary | ICD-10-CM

## 2014-03-02 ENCOUNTER — Encounter (HOSPITAL_COMMUNITY): Payer: Self-pay

## 2014-03-02 ENCOUNTER — Other Ambulatory Visit (HOSPITAL_COMMUNITY): Payer: Self-pay | Admitting: Internal Medicine

## 2014-03-02 ENCOUNTER — Encounter (HOSPITAL_COMMUNITY)
Admission: RE | Admit: 2014-03-02 | Discharge: 2014-03-02 | Disposition: A | Discharge status: Home / Self Care | Payer: Medicare Other | Source: Ambulatory Visit | Attending: Internal Medicine | Admitting: Internal Medicine

## 2014-03-02 ENCOUNTER — Ambulatory Visit (INDEPENDENT_AMBULATORY_CARE_PROVIDER_SITE_OTHER): Payer: Medicare Other

## 2014-03-02 VITALS — BP 125/58 | HR 96 | Temp 98.3°F | Resp 20

## 2014-03-02 DIAGNOSIS — I4891 Unspecified atrial fibrillation: Secondary | ICD-10-CM | POA: Diagnosis not present

## 2014-03-02 DIAGNOSIS — K565 Intestinal adhesions [bands] with obstruction (postprocedural) (postinfection): Secondary | ICD-10-CM | POA: Diagnosis not present

## 2014-03-02 DIAGNOSIS — D509 Iron deficiency anemia, unspecified: Secondary | ICD-10-CM

## 2014-03-02 DIAGNOSIS — J45909 Unspecified asthma, uncomplicated: Secondary | ICD-10-CM | POA: Insufficient documentation

## 2014-03-02 DIAGNOSIS — K219 Gastro-esophageal reflux disease without esophagitis: Secondary | ICD-10-CM | POA: Diagnosis not present

## 2014-03-02 DIAGNOSIS — I97131 Postprocedural heart failure following other surgery: Secondary | ICD-10-CM | POA: Insufficient documentation

## 2014-03-02 DIAGNOSIS — I2510 Atherosclerotic heart disease of native coronary artery without angina pectoris: Secondary | ICD-10-CM | POA: Diagnosis not present

## 2014-03-02 DIAGNOSIS — E538 Deficiency of other specified B group vitamins: Secondary | ICD-10-CM

## 2014-03-02 DIAGNOSIS — I509 Heart failure, unspecified: Secondary | ICD-10-CM | POA: Diagnosis not present

## 2014-03-02 DIAGNOSIS — J449 Chronic obstructive pulmonary disease, unspecified: Secondary | ICD-10-CM | POA: Insufficient documentation

## 2014-03-02 DIAGNOSIS — Z882 Allergy status to sulfonamides status: Secondary | ICD-10-CM | POA: Insufficient documentation

## 2014-03-02 MED ORDER — SODIUM CHLORIDE 0.9 % IV SOLN
Freq: Every day | INTRAVENOUS | Status: DC
Start: 2014-03-02 — End: 2014-03-03
  Administered 2014-03-02: 15:00:00 via INTRAVENOUS

## 2014-03-02 MED ORDER — SODIUM CHLORIDE 0.9 % IV SOLN
510.0000 mg | Freq: Once | INTRAVENOUS | Status: AC
  Administered 2014-03-02: 510 mg via INTRAVENOUS
  Filled 2014-03-02: qty 17

## 2014-03-02 MED ORDER — CYANOCOBALAMIN 1000 MCG/ML IJ SOLN
1000.0000 ug | Freq: Once | INTRAMUSCULAR | Status: AC
  Administered 2014-03-02: 1000 ug via INTRAMUSCULAR

## 2014-03-02 NOTE — Discharge Instructions (Signed)

## 2014-03-02 NOTE — Progress Notes (Signed)
Pt tolerated injection well

## 2014-03-02 NOTE — Progress Notes (Signed)
Pre visit review using our clinic review tool, if applicable. No additional management support is needed unless otherwise documented below in the visit note. 

## 2014-03-02 NOTE — Progress Notes (Signed)
feraheme infusion tolerated well.  2nd infusion due on 12/22.  Pt received med about one month ago and tolerated well.  Pt d/c home ambulatory.

## 2014-03-09 ENCOUNTER — Encounter (HOSPITAL_COMMUNITY)
Admission: RE | Admit: 2014-03-09 | Discharge: 2014-03-09 | Disposition: A | Discharge status: Home / Self Care | Payer: Medicare Other | Source: Ambulatory Visit | Attending: Internal Medicine | Admitting: Internal Medicine

## 2014-03-09 ENCOUNTER — Encounter (HOSPITAL_COMMUNITY): Payer: Self-pay

## 2014-03-09 VITALS — BP 102/45 | HR 90 | Temp 97.9°F | Resp 20

## 2014-03-09 DIAGNOSIS — K565 Intestinal adhesions [bands] with obstruction (postprocedural) (postinfection): Secondary | ICD-10-CM | POA: Diagnosis not present

## 2014-03-09 DIAGNOSIS — D509 Iron deficiency anemia, unspecified: Secondary | ICD-10-CM

## 2014-03-09 MED ORDER — SODIUM CHLORIDE 0.9 % IV SOLN
510.0000 mg | Freq: Once | INTRAVENOUS | Status: AC
  Administered 2014-03-09: 510 mg via INTRAVENOUS
  Filled 2014-03-09: qty 17

## 2014-03-09 MED ORDER — SODIUM CHLORIDE 0.9 % IV SOLN
Freq: Every day | INTRAVENOUS | Status: AC
Start: 2014-03-09 — End: 2014-03-09
  Administered 2014-03-09: 14:00:00 via INTRAVENOUS

## 2014-03-09 NOTE — Discharge Instructions (Signed)

## 2014-03-16 ENCOUNTER — Encounter: Payer: Self-pay | Admitting: Family Medicine

## 2014-03-16 ENCOUNTER — Telehealth: Payer: Self-pay | Admitting: Family Medicine

## 2014-03-16 DIAGNOSIS — R52 Pain, unspecified: Secondary | ICD-10-CM

## 2014-03-16 MED ORDER — OXYCODONE-ACETAMINOPHEN 10-325 MG PO TABS: 1.0000 | ORAL_TABLET | ORAL | Status: DC | PRN

## 2014-03-16 NOTE — Telephone Encounter (Signed)
Last seen and filled 02/01/14 #120 UDS 01/09/13 low risk   Please advise     KP

## 2014-03-16 NOTE — Telephone Encounter (Signed)
Patient aware Rx has been faxed.      KP

## 2014-03-16 NOTE — Telephone Encounter (Signed)
i don't know it

## 2014-03-16 NOTE — Telephone Encounter (Signed)
Refill x1 

## 2014-03-16 NOTE — Telephone Encounter (Signed)
Pt requesting a refill oxyCODONE-acetaminophen (PERCOCET) 10-325 MG per tablet

## 2014-03-17 ENCOUNTER — Telehealth: Payer: Self-pay | Admitting: Family Medicine

## 2014-03-17 NOTE — Telephone Encounter (Signed)
Caller name: Acacia Relation to pt: self Call back number:  [phone] [facility] [address]   Reason for call:   Patient requesting a refill of flexeril

## 2014-03-18 MED ORDER — CYCLOBENZAPRINE HCL 10 MG PO TABS: 10.0000 mg | ORAL_TABLET | Freq: Three times a day (TID) | ORAL | Status: DC | PRN

## 2014-03-23 ENCOUNTER — Encounter: Payer: Self-pay | Admitting: Internal Medicine

## 2014-03-23 ENCOUNTER — Other Ambulatory Visit (INDEPENDENT_AMBULATORY_CARE_PROVIDER_SITE_OTHER): Payer: PPO

## 2014-03-23 ENCOUNTER — Ambulatory Visit (INDEPENDENT_AMBULATORY_CARE_PROVIDER_SITE_OTHER): Payer: PPO | Admitting: Internal Medicine

## 2014-03-23 VITALS — BP 122/64 | HR 76 | Ht 60.5 in | Wt 100.5 lb

## 2014-03-23 DIAGNOSIS — Z8719 Personal history of other diseases of the digestive system: Secondary | ICD-10-CM | POA: Diagnosis not present

## 2014-03-23 DIAGNOSIS — D509 Iron deficiency anemia, unspecified: Secondary | ICD-10-CM | POA: Diagnosis not present

## 2014-03-23 DIAGNOSIS — E538 Deficiency of other specified B group vitamins: Secondary | ICD-10-CM | POA: Diagnosis not present

## 2014-03-23 LAB — CBC WITH DIFFERENTIAL/PLATELET
Basophils Absolute: 0.1 10*3/uL (ref 0.0–0.1)
Basophils Relative: 0.6 % (ref 0.0–3.0)
Eosinophils Absolute: 0.4 10*3/uL (ref 0.0–0.7)
Eosinophils Relative: 5.2 % — ABNORMAL HIGH (ref 0.0–5.0)
HCT: 34.2 % — ABNORMAL LOW (ref 36.0–46.0)
Hemoglobin: 11 g/dL — ABNORMAL LOW (ref 12.0–15.0)
Lymphocytes Relative: 6.8 % — ABNORMAL LOW (ref 12.0–46.0)
Lymphs Abs: 0.6 10*3/uL — ABNORMAL LOW (ref 0.7–4.0)
MCHC: 32.3 g/dL (ref 30.0–36.0)
MCV: 87.6 fl (ref 78.0–100.0)
Monocytes Absolute: 0.5 10*3/uL (ref 0.1–1.0)
Monocytes Relative: 6.1 % (ref 3.0–12.0)
Neutro Abs: 6.9 10*3/uL (ref 1.4–7.7)
Neutrophils Relative %: 81.3 % — ABNORMAL HIGH (ref 43.0–77.0)
Platelets: 310 10*3/uL (ref 150.0–400.0)
RBC: 3.9 Mil/uL (ref 3.87–5.11)
RDW: 15.4 % (ref 11.5–15.5)
WBC: 8.5 10*3/uL (ref 4.0–10.5)

## 2014-03-23 LAB — IBC PANEL
Iron: 68 ug/dL (ref 42–145)
Saturation Ratios: 23.6 % (ref 20.0–50.0)
Transferrin: 206 mg/dL — ABNORMAL LOW (ref 212.0–360.0)

## 2014-03-23 LAB — VITAMIN B12: Vitamin B-12: 509 pg/mL (ref 211–911)

## 2014-03-23 NOTE — Progress Notes (Signed)
[name] [birth year] [id number]  Note: This dictation was prepared with Dragon digital system. Any transcriptional errors that result from this procedure are unintentional.   History of Present Illness:  This is a 69 year old white female with chronic partial small bowel obstruction due to adhesive disease. Her last appointment was in October 2015. She has chronic iron deficiency due to low-grade GI blood loss. She completed 2 iron infusions in October 2015 and on 03/09/2014. She has oxygen dependent COPD, followed by Dr.Sood.  She had an upper GI bleed secondary to Pradaxa. She has chronic failure to thrive. At her last appointment, her weight was 103 pounds. Today it is 100 pounds. She had an episode of vomiting and diarrhea this morning. Her last surgery was in March 2011 and it was attempted lysis  of adhesions which was unsuccessful.   Past Medical History  Diagnosis Date  . GERD (gastroesophageal reflux disease)   . CAD (coronary artery disease)     mild disease per cath in 2011  . Venous embolism and thrombosis of subclavian vein     after pacemaker insertion Oct 2011  . Pleural effusion      s/p right thoracentesis 03/09  . Small bowel obstruction   . Urinary incontinence      -INTERSTIM IMPLANT NOT FUNCTIONING PER PT  . RLS (restless legs syndrome)   . Primary dilated cardiomyopathy     EF 45 to 50% per echo in Jan 2012  . Vitamin B12 deficiency   . Gastroparesis   . Chronic nausea   . Chronic abdominal pain   . Chronic pain syndrome   . Pacemaker     CRT therapy; followed by Dr. Caryl Comes  . H/O: GI bleed     from Pradaxa  . Oxygen dependent     2 liters via nasal cannula at all times  . CHF (congestive heart failure)   . Anemia   . Hx of stress fx aug 2011    right hip   . Elbow fracture, left aug 2012  . Arthritis     right hip  . Hx of gastric ulcer   . Diverticulitis   . HTN (hypertension)     under control; has been on med. x "years"  . Hx MRSA infection    . Pneumonia - 03/09, 12/09, 02/10, 06/10    MOST RECENT FEB 2013  . Elevated liver enzymes   . Blood transfusion   . Anginal pain   . Shortness of breath   . Fibromyalgia     COUPLE OF TIMES A YEAR-PT HAS EPISODES OF CONFUSION-USUALLY INVOLVES DAY/NIGHT REVERSAL AND EPISODES OF TWITCHING AND FALLS--SEES DR. Jacelyn Grip - NEUROLOGIST-LAST SEEN 07/10/11  . LBBB (left bundle branch block)   . Atrial fibrillation   . COPD (chronic obstructive pulmonary disease)     continuous O2- 2l  . Hepatitis     hx of in high school   . Silent aspiration     Past Surgical History  Procedure Laterality Date  . Appendectomy    . Pacemaker placement  12/21/2009  . Peg removed      with complications  . Tonsillectomy  age 50  . Interstim implant revision  03/06/2011    Procedure: REVISION OF [name];  Surgeon: Reece Packer, MD;  Location: [facility];  Service: Urology;  Laterality: N/A;  Replacement of Neurostimulator  . Portacath placement  12/16/2009  . Total abdominal hysterectomy      complete  . Interstim implant placement  05/28/2006 - stage I    06/05/2006 - stage II  . Interstim implant revision  10/23/2007  . Cystoscopy with injection  04/30/2006    transurethral collagen injection; incision vaginal stenosis  . Small intestine surgery  05/20/2001    ex. lap., resection of small bowel stricture; gastrostomy; insertion central line  . Cardiac catheterization  04/08/2006, 11/16/2009  . Cystoscopy  11/11/2008  . Cystoscopy w/ retrogrades  11/11/2008    right  . Colectomy      Intestinal resection (5times)  . Colectomy  02/04/2000    ex. lap., intra-abd. subtotal colectomy with ileosigmoid colon anastomosies and lysis of adhesions  . Gastrocutaneous fistula closure    . Exploratory laparotomy  04/27/2009    lysis of adhesions, gastrostomy tube  . Port-a-cath removal  07/27/2011    Procedure: REMOVAL PORT-A-CATH;  Surgeon: Rolm Bookbinder, MD;  Location: [facility];  Service: General;   Laterality: N/A;  . Total hip arthroplasty  08/03/2011    Procedure: TOTAL HIP ARTHROPLASTY ANTERIOR APPROACH;  Surgeon: Mcarthur Rossetti, MD;  Location: [facility];  Service: Orthopedics;  Laterality: Right;  . Interstim implant placement  02/05/2012    Procedure: INTERSTIM IMPLANT FIRST STAGE;  Surgeon: Reece Packer, MD;  Location: [facility];  Service: Urology;  Laterality: N/A;  Replacement of Interstim Lead     . Esophagogastroduodenoscopy N/A 02/02/2013    Procedure: ESOPHAGOGASTRODUODENOSCOPY (EGD);  Surgeon: Lafayette Dragon, MD;  Location: [facility];  Service: Endoscopy;  Laterality: N/A;  . Savory dilation N/A 02/02/2013    Procedure: SAVORY DILATION;  Surgeon: Lafayette Dragon, MD;  Location: [facility];  Service: Endoscopy;  Laterality: N/A;    Allergies  Allergen Reactions  . Dabigatran Etexilate Mesylate Other (See Comments)    INTERNAL BLEEDING-pradaxa  . Talwin [Pentazocine] Other (See Comments)    hallucinations  . Pentazocine Lactate Other (See Comments)    HALLUCINATIONS  . Sulfonamide Derivatives Other (See Comments)    JITTERINESS     Family history and social history have been reviewed.  Review of Systems: Positive for nausea, vomiting. Diarrhea. Abdominal pain  The remainder of the 10 point ROS is negative except as outlined in the H&P  Physical Exam: General Appearance thin chronically ill-appearing, in no distress, with oxygen prone in her, ambulates independently Eyes  Non icteric  HEENT  Non traumatic, normocephalic  Mouth No lesion, tongue papillated, no cheilosis Neck Supple without adenopathy, thyroid not enlarged, no carotid bruits, no JVD Lungs Clear to auscultation bilaterally COR Normal S1, normal S2, regular rhythm, no murmur, quiet precordium Abdomen soft. Epigastric tenderness. Multiple surgical scars. No bruit. No tympany today or distention.  Rectal not repeated Extremities  No pedal edema Skin No lesions Neurological Alert and  oriented x 3 Psychological Normal mood and affect  Assessment and Plan:   Problem #97 69 year old white female with stable low-grade small bowel obstruction due to adhesive disease. She has chronic GI blood loss requiring periodic iron infusions. We will check her CBC today as well as B12 and iron levels. She needs a refill on Protonix. I will see her in 3 months.    Delfin Edis 03/23/2014

## 2014-03-23 NOTE — Patient Instructions (Signed)
Your physician has requested that you go to the basement for the following lab work before leaving today: CBC, B12, IBC  We have sent the following medications to your pharmacy for you to pick up at your convenience: Protonix twice daily.  Please follow up with Dr Olevia Perches in 3 months.  CC:Dr Lowne

## 2014-03-24 ENCOUNTER — Other Ambulatory Visit: Payer: Self-pay | Admitting: *Deleted

## 2014-03-24 DIAGNOSIS — D509 Iron deficiency anemia, unspecified: Secondary | ICD-10-CM

## 2014-04-01 ENCOUNTER — Telehealth: Payer: Self-pay | Admitting: Family Medicine

## 2014-04-01 ENCOUNTER — Ambulatory Visit (INDEPENDENT_AMBULATORY_CARE_PROVIDER_SITE_OTHER): Payer: PPO

## 2014-04-01 DIAGNOSIS — E538 Deficiency of other specified B group vitamins: Secondary | ICD-10-CM

## 2014-04-01 MED ORDER — CYANOCOBALAMIN 1000 MCG/ML IJ SOLN
1000.0000 ug | Freq: Once | INTRAMUSCULAR | Status: AC
  Administered 2014-04-01: 1000 ug via INTRAMUSCULAR

## 2014-04-01 NOTE — Telephone Encounter (Signed)
Caller name: [place] Relationship to patient: other/  [employer] can be reached: [id number]   Reason for call: [employer] called requesting a prio-auth for zolpidem (AMBIEN) 5 MG tablet   Requesting a Dx and time frame is sensitive may be denied if not responded too within a few hours.

## 2014-04-01 NOTE — Progress Notes (Signed)
Pre visit review using our clinic review tool, if applicable. No additional management support is needed unless otherwise documented below in the visit note. Patient tolerated well.

## 2014-04-01 NOTE — Telephone Encounter (Signed)
Prior authorization received from [facility] Rx, completed and faxed back to [phone] successfully. JG//CMA

## 2014-04-01 NOTE — Telephone Encounter (Signed)
PA denied. Sent fax requesting covered alternatives. Awaiting reply. JG//CMA

## 2014-04-05 ENCOUNTER — Other Ambulatory Visit: Payer: Self-pay | Admitting: Internal Medicine

## 2014-04-14 ENCOUNTER — Telehealth: Payer: Self-pay

## 2014-04-14 NOTE — Telephone Encounter (Signed)
Patient has new insurance called [employer]. Admin ans Prolia are subject to a 20% co-insurance up to a $3400 out of pocket max ($0 met). If an office visit is billed, a $15 co-pay will apply. Once met, coverage increases it 100% of the contracted rate. No deductible applies. This plan also requires a Prior authorization for the Prolia injection. PA form has been retrieved from the web page and faxed.      KP

## 2014-04-15 NOTE — Telephone Encounter (Signed)
Pizanidine, Jettie Booze, and carisoprodel Best # [phone]

## 2014-04-16 NOTE — Telephone Encounter (Signed)
Mila from Pharmacy calling back for this. Best # [phone].

## 2014-04-19 ENCOUNTER — Other Ambulatory Visit: Payer: Self-pay | Admitting: Family Medicine

## 2014-04-19 NOTE — Telephone Encounter (Signed)
Last seen 02/01/14 and filled 01/21/14 #30 with 2 refills.   UDS 01/09/13 low risk    Please advise      KP

## 2014-04-20 ENCOUNTER — Telehealth: Payer: Self-pay | Admitting: Family Medicine

## 2014-04-20 DIAGNOSIS — R52 Pain, unspecified: Secondary | ICD-10-CM

## 2014-04-20 MED ORDER — OXYCODONE-ACETAMINOPHEN 10-325 MG PO TABS: 1.0000 | ORAL_TABLET | ORAL | Status: DC | PRN

## 2014-04-20 NOTE — Telephone Encounter (Signed)
Last seen 02/01/14 and filled 03/16/14 #120 UDS 01/09/13 low risk   Please advise     KP

## 2014-04-20 NOTE — Telephone Encounter (Signed)
tiazandidine 4 mg #90 1 po tid prn

## 2014-04-20 NOTE — Telephone Encounter (Signed)
Refill x1 

## 2014-04-20 NOTE — Telephone Encounter (Signed)
Caller name:Arrellano, Karlisa Relation to FM:MCRF Call back number [phone] Pharmacy:  Reason for call: pt is needing rx oxyCODONE-acetaminophen (PERCOCET) 10-325 MG per tablet  Please call when ready and available for pick up.

## 2014-04-20 NOTE — Telephone Encounter (Signed)
Please advise with appropriate alternative from this options below.

## 2014-04-20 NOTE — Telephone Encounter (Signed)
Patient aware Rx ready for pick up.      KP 

## 2014-04-20 NOTE — Telephone Encounter (Signed)
This are all muscle relaxers , I believe---.  I think a few are spelled wrong but Lorrin Mais is for sleep---the ones you wrote down are for sleep.

## 2014-04-20 NOTE — Telephone Encounter (Signed)
Correction, this information is a PA that was denied for cyclobenzaprine 10 mg. The alternatives that are listed are for this medication and not ambien. JG//CMA

## 2014-04-21 MED ORDER — TIZANIDINE HCL 4 MG PO TABS: 4.0000 mg | ORAL_TABLET | Freq: Three times a day (TID) | ORAL | Status: DC | PRN

## 2014-04-21 NOTE — Telephone Encounter (Signed)
New med e-scribed to pharmacy. JG//CMA

## 2014-04-21 NOTE — Addendum Note (Signed)
Addended by: Murtis Sink A on: 04/21/2014 02:14 PM   Modules accepted: Orders, Medications

## 2014-04-26 ENCOUNTER — Telehealth: Payer: Self-pay | Admitting: Family Medicine

## 2014-04-26 NOTE — Telephone Encounter (Signed)
Ok -- we only sent that one because her ins said they would not pay for flexeril

## 2014-04-26 NOTE — Telephone Encounter (Signed)
Spoke with patient and she stated that the Tizanidine was sent but she is taking the Flexeril, she said the insurance company approved the Rx on 04/12/14 and she said she does not want you to think she is requesting all of these medication, she said she is not taking the Tizanidine at this time.She will hold on the the Tizanidine just in case she needs it.              KP

## 2014-04-26 NOTE — Telephone Encounter (Signed)
Caller name: Savayah, Waltrip Relation to pt: self  Call back number: [phone] Pharmacy: [facility], [place] [phone] (Phone) [phone] (Fax)     Reason for call:   Pt states she doesn't know what this medication is and why it was prescribed. Please advise

## 2014-04-27 NOTE — Telephone Encounter (Signed)
Received fax from [employer]	[place] that [place] has been approved effective 04/14/2014 through 03/19/2015.

## 2014-04-29 ENCOUNTER — Encounter: Payer: Self-pay | Admitting: Family Medicine

## 2014-04-29 ENCOUNTER — Ambulatory Visit (INDEPENDENT_AMBULATORY_CARE_PROVIDER_SITE_OTHER): Payer: PPO | Admitting: Family Medicine

## 2014-04-29 VITALS — BP 122/76 | HR 90 | Temp 97.7°F | Wt 103.8 lb

## 2014-04-29 DIAGNOSIS — G894 Chronic pain syndrome: Secondary | ICD-10-CM

## 2014-04-29 DIAGNOSIS — R1084 Generalized abdominal pain: Secondary | ICD-10-CM

## 2014-04-29 DIAGNOSIS — M810 Age-related osteoporosis without current pathological fracture: Secondary | ICD-10-CM

## 2014-04-29 MED ORDER — GABAPENTIN 600 MG PO TABS: ORAL_TABLET | ORAL | Status: DC

## 2014-04-29 MED ORDER — DENOSUMAB 60 MG/ML ~~LOC~~ SOLN
60.0000 mg | Freq: Once | SUBCUTANEOUS | Status: AC
  Administered 2014-04-29: 60 mg via SUBCUTANEOUS

## 2014-04-29 NOTE — Assessment & Plan Note (Signed)
Stable con't pain meds

## 2014-04-29 NOTE — Progress Notes (Signed)
Pre visit review using our clinic review tool, if applicable. No additional management support is needed unless otherwise documented below in the visit note. 

## 2014-04-29 NOTE — Patient Instructions (Signed)
Osteoporosis Throughout your life, your body breaks down old bone and replaces it with new bone. As you get older, your body does not replace bone as quickly as it breaks it down. By the age of 30 years, most people begin to gradually lose bone because of the imbalance between bone loss and replacement. Some people lose more bone than others. Bone loss beyond a specified normal degree is considered osteoporosis.  Osteoporosis affects the strength and durability of your bones. The inside of the ends of your bones and your flat bones, like the bones of your pelvis, look like honeycomb, filled with tiny open spaces. As bone loss occurs, your bones become less dense. This means that the open spaces inside your bones become bigger and the walls between these spaces become thinner. This makes your bones weaker. Bones of a person with osteoporosis can become so weak that they can break (fracture) during minor accidents, such as a simple fall. CAUSES  The following factors have been associated with the development of osteoporosis:  Smoking.  Drinking more than 2 alcoholic drinks several days per week.  Long-term use of certain medicines:  Corticosteroids.  Chemotherapy medicines.  Thyroid medicines.  Antiepileptic medicines.  Gonadal hormone suppression medicine.  Immunosuppression medicine.  Being underweight.  Lack of physical activity.  Lack of exposure to the sun. This can lead to vitamin D deficiency.  Certain medical conditions:  Certain inflammatory bowel diseases, such as Crohn disease and ulcerative colitis.  Diabetes.  Hyperthyroidism.  Hyperparathyroidism. RISK FACTORS Anyone can develop osteoporosis. However, the following factors can increase your risk of developing osteoporosis:  Gender--Women are at higher risk than men.  Age--Being older than 50 years increases your risk.  Ethnicity--White and Asian people have an increased risk.  Weight --Being extremely  underweight can increase your risk of osteoporosis.  Family history of osteoporosis--Having a family member who has developed osteoporosis can increase your risk. SYMPTOMS  Usually, people with osteoporosis have no symptoms.  DIAGNOSIS  Signs during a physical exam that may prompt your caregiver to suspect osteoporosis include:  Decreased height. This is usually caused by the compression of the bones that form your spine (vertebrae) because they have weakened and become fractured.  A curving or rounding of the upper back (kyphosis). To confirm signs of osteoporosis, your caregiver may request a procedure that uses 2 low-dose X-ray beams with different levels of energy to measure your bone mineral density (dual-energy X-ray absorptiometry [DXA]). Also, your caregiver may check your level of vitamin D. TREATMENT  The goal of osteoporosis treatment is to strengthen bones in order to decrease the risk of bone fractures. There are different types of medicines available to help achieve this goal. Some of these medicines work by slowing the processes of bone loss. Some medicines work by increasing bone density. Treatment also involves making sure that your levels of calcium and vitamin D are adequate. PREVENTION  There are things you can do to help prevent osteoporosis. Adequate intake of calcium and vitamin D can help you achieve optimal bone mineral density. Regular exercise can also help, especially resistance and weight-bearing activities. If you smoke, quitting smoking is an important part of osteoporosis prevention. MAKE SURE YOU:  Understand these instructions.  Will watch your condition.  Will get help right away if you are not doing well or get worse. FOR MORE INFORMATION www.osteo.org and www.nof.org Document Released: 12/13/2004 Document Revised: 06/30/2012 Document Reviewed: 02/17/2011 Exit[employer] Patient Information 2015 [employer]. This information is not   intended to replace advice  given to you by your health care provider. Make sure you discuss any questions you have with your health care provider.  

## 2014-04-29 NOTE — Progress Notes (Signed)
Subjective:    Patient ID: [name], female    DOB: 1945-08-25, 69 y.o.   MRN: [id number]  HPI  Patient here for f/u chronic abd pain and prolia injection.  Past Medical History  Diagnosis Date  . GERD (gastroesophageal reflux disease)   . CAD (coronary artery disease)     mild disease per cath in 2011  . Venous embolism and thrombosis of subclavian vein     after pacemaker insertion Oct 2011  . Pleural effusion      s/p right thoracentesis 03/09  . Small bowel obstruction   . Urinary incontinence      -INTERSTIM IMPLANT NOT FUNCTIONING PER PT  . RLS (restless legs syndrome)   . Primary dilated cardiomyopathy     EF 45 to 50% per echo in Jan 2012  . Vitamin B12 deficiency   . Gastroparesis   . Chronic nausea   . Chronic abdominal pain   . Chronic pain syndrome   . Pacemaker     CRT therapy; followed by Dr. Caryl Comes  . H/O: GI bleed     from Pradaxa  . Oxygen dependent     2 liters via nasal cannula at all times  . CHF (congestive heart failure)   . Anemia   . Hx of stress fx aug 2011    right hip   . Elbow fracture, left aug 2012  . Arthritis     right hip  . Hx of gastric ulcer   . Diverticulitis   . HTN (hypertension)     under control; has been on med. x "years"  . Hx MRSA infection   . Pneumonia - 03/09, 12/09, 02/10, 06/10    MOST RECENT FEB 2013  . Elevated liver enzymes   . Blood transfusion   . Anginal pain   . Shortness of breath   . Fibromyalgia     COUPLE OF TIMES A YEAR-PT HAS EPISODES OF CONFUSION-USUALLY INVOLVES DAY/NIGHT REVERSAL AND EPISODES OF TWITCHING AND FALLS--SEES DR. Jacelyn Grip - NEUROLOGIST-LAST SEEN 07/10/11  . LBBB (left bundle branch block)   . Atrial fibrillation   . COPD (chronic obstructive pulmonary disease)     continuous O2- 2l  . Hepatitis     hx of in high school   . Silent aspiration     Review of Systems  Constitutional: Negative for activity change, appetite change and unexpected weight change.  Respiratory: Negative  for cough and shortness of breath.   Cardiovascular: Negative for chest pain and palpitations.  Psychiatric/Behavioral: Negative for behavioral problems and dysphoric mood. The patient is not nervous/anxious.        Objective:    Physical Exam  Constitutional: She is oriented to person, place, and time. She appears well-developed and well-nourished. No distress.  HENT:  Right Ear: External ear normal.  Left Ear: External ear normal.  Nose: Nose normal.  Mouth/Throat: Oropharynx is clear and moist.  Eyes: EOM are normal. Pupils are equal, round, and reactive to light.  Neck: Normal range of motion. Neck supple.  Cardiovascular: Normal rate, regular rhythm and normal heart sounds.   No murmur heard. Pulmonary/Chest: Effort normal and breath sounds normal. No respiratory distress. She has no wheezes. She has no rales. She exhibits no tenderness.  O2 dependant  Neurological: She is alert and oriented to person, place, and time.  Psychiatric: She has a normal mood and affect. Her behavior is normal. Judgment and thought content normal.    BP 122/76 mmHg  Pulse  90  Temp(Src) 97.7 F (36.5 C) (Oral)  Wt 103 lb 12.8 oz (47.083 kg)  SpO2 96% Wt Readings from Last 3 Encounters:  04/29/14 103 lb 12.8 oz (47.083 kg)  03/23/14 100 lb 8 oz (45.587 kg)  02/01/14 101 lb (45.813 kg)     Lab Results  Component Value Date   WBC 8.5 03/23/2014   HGB 11.0* 03/23/2014   HCT 34.2* 03/23/2014   PLT 310.0 03/23/2014   GLUCOSE 92 02/17/2014   CHOL 171 07/22/2013   TRIG 131.0 07/22/2013   HDL 37.10* 07/22/2013   LDLCALC 108* 07/22/2013   ALT 17 02/17/2014   AST 25 02/17/2014   NA 134* 02/17/2014   K 4.0 02/17/2014   CL 100 02/17/2014   CREATININE 0.9 02/17/2014   BUN 18 02/17/2014   CO2 30 02/17/2014   TSH 4.43 06/26/2010   INR 1.02 08/07/2011   HGBA1C  08/17/2007    5.6 (NOTE)   The ADA recommends the following therapeutic goals for glycemic   control related to Hgb A1C  measurement:   Goal of Therapy:   < 7.0% Hgb A1C   Action Suggested:  > 8.0% Hgb A1C   Ref:  Diabetes Care, 22, Suppl. 1, 1999    No results found.     Assessment & Plan:   Problem List Items Addressed This Visit    Chronic pain syndrome   Relevant Medications   gabapentin (NEURONTIN) tablet    Other Visit Diagnoses    Osteoporosis    -  Primary    Relevant Medications    denosumab (PROLIA) injection 60 mg (Completed)        Garnet Koyanagi, DO

## 2014-05-04 ENCOUNTER — Ambulatory Visit: Payer: PPO

## 2014-05-05 ENCOUNTER — Ambulatory Visit: Payer: PPO

## 2014-05-11 ENCOUNTER — Telehealth: Payer: Self-pay | Admitting: *Deleted

## 2014-05-11 ENCOUNTER — Ambulatory Visit: Payer: PPO

## 2014-05-11 NOTE — Telephone Encounter (Signed)
Prior authorization for Ambien initiated over the phone with [facility]. Prior authorization representative stated we would receive a fax of the determination. [id number]

## 2014-05-15 ENCOUNTER — Emergency Department (HOSPITAL_BASED_OUTPATIENT_CLINIC_OR_DEPARTMENT_OTHER): Payer: PPO

## 2014-05-15 ENCOUNTER — Emergency Department (HOSPITAL_BASED_OUTPATIENT_CLINIC_OR_DEPARTMENT_OTHER)
Admission: EM | Admit: 2014-05-15 | Discharge: 2014-05-15 | Disposition: A | Discharge status: Home / Self Care | Payer: PPO | Source: Home / Self Care | Attending: Emergency Medicine | Admitting: Emergency Medicine

## 2014-05-15 ENCOUNTER — Encounter (HOSPITAL_BASED_OUTPATIENT_CLINIC_OR_DEPARTMENT_OTHER): Payer: Self-pay

## 2014-05-15 DIAGNOSIS — M1990 Unspecified osteoarthritis, unspecified site: Secondary | ICD-10-CM | POA: Insufficient documentation

## 2014-05-15 DIAGNOSIS — Z96641 Presence of right artificial hip joint: Secondary | ICD-10-CM | POA: Diagnosis not present

## 2014-05-15 DIAGNOSIS — Z885 Allergy status to narcotic agent status: Secondary | ICD-10-CM | POA: Diagnosis not present

## 2014-05-15 DIAGNOSIS — W01198A Fall on same level from slipping, tripping and stumbling with subsequent striking against other object, initial encounter: Secondary | ICD-10-CM | POA: Insufficient documentation

## 2014-05-15 DIAGNOSIS — Y92008 Other place in unspecified non-institutional (private) residence as the place of occurrence of the external cause: Secondary | ICD-10-CM | POA: Insufficient documentation

## 2014-05-15 DIAGNOSIS — I10 Essential (primary) hypertension: Secondary | ICD-10-CM

## 2014-05-15 DIAGNOSIS — Z79891 Long term (current) use of opiate analgesic: Secondary | ICD-10-CM | POA: Diagnosis not present

## 2014-05-15 DIAGNOSIS — Z9981 Dependence on supplemental oxygen: Secondary | ICD-10-CM | POA: Insufficient documentation

## 2014-05-15 DIAGNOSIS — E872 Acidosis: Secondary | ICD-10-CM | POA: Diagnosis not present

## 2014-05-15 DIAGNOSIS — Z862 Personal history of diseases of the blood and blood-forming organs and certain disorders involving the immune mechanism: Secondary | ICD-10-CM

## 2014-05-15 DIAGNOSIS — I2510 Atherosclerotic heart disease of native coronary artery without angina pectoris: Secondary | ICD-10-CM | POA: Diagnosis not present

## 2014-05-15 DIAGNOSIS — Z9181 History of falling: Secondary | ICD-10-CM | POA: Diagnosis not present

## 2014-05-15 DIAGNOSIS — K219 Gastro-esophageal reflux disease without esophagitis: Secondary | ICD-10-CM

## 2014-05-15 DIAGNOSIS — Z90710 Acquired absence of both cervix and uterus: Secondary | ICD-10-CM | POA: Diagnosis not present

## 2014-05-15 DIAGNOSIS — Z8701 Personal history of pneumonia (recurrent): Secondary | ICD-10-CM | POA: Insufficient documentation

## 2014-05-15 DIAGNOSIS — S2231XA Fracture of one rib, right side, initial encounter for closed fracture: Secondary | ICD-10-CM | POA: Insufficient documentation

## 2014-05-15 DIAGNOSIS — I420 Dilated cardiomyopathy: Secondary | ICD-10-CM | POA: Diagnosis not present

## 2014-05-15 DIAGNOSIS — G8929 Other chronic pain: Secondary | ICD-10-CM

## 2014-05-15 DIAGNOSIS — Z8614 Personal history of Methicillin resistant Staphylococcus aureus infection: Secondary | ICD-10-CM | POA: Insufficient documentation

## 2014-05-15 DIAGNOSIS — J449 Chronic obstructive pulmonary disease, unspecified: Secondary | ICD-10-CM | POA: Insufficient documentation

## 2014-05-15 DIAGNOSIS — I25119 Atherosclerotic heart disease of native coronary artery with unspecified angina pectoris: Secondary | ICD-10-CM | POA: Insufficient documentation

## 2014-05-15 DIAGNOSIS — Z888 Allergy status to other drugs, medicaments and biological substances status: Secondary | ICD-10-CM | POA: Diagnosis not present

## 2014-05-15 DIAGNOSIS — E538 Deficiency of other specified B group vitamins: Secondary | ICD-10-CM | POA: Diagnosis not present

## 2014-05-15 DIAGNOSIS — Z882 Allergy status to sulfonamides status: Secondary | ICD-10-CM | POA: Diagnosis not present

## 2014-05-15 DIAGNOSIS — M797 Fibromyalgia: Secondary | ICD-10-CM | POA: Insufficient documentation

## 2014-05-15 DIAGNOSIS — Z86718 Personal history of other venous thrombosis and embolism: Secondary | ICD-10-CM | POA: Insufficient documentation

## 2014-05-15 DIAGNOSIS — Z803 Family history of malignant neoplasm of breast: Secondary | ICD-10-CM | POA: Diagnosis not present

## 2014-05-15 DIAGNOSIS — Y998 Other external cause status: Secondary | ICD-10-CM | POA: Insufficient documentation

## 2014-05-15 DIAGNOSIS — I5042 Chronic combined systolic (congestive) and diastolic (congestive) heart failure: Secondary | ICD-10-CM | POA: Diagnosis not present

## 2014-05-15 DIAGNOSIS — G894 Chronic pain syndrome: Secondary | ICD-10-CM | POA: Diagnosis not present

## 2014-05-15 DIAGNOSIS — I509 Heart failure, unspecified: Secondary | ICD-10-CM

## 2014-05-15 DIAGNOSIS — I4891 Unspecified atrial fibrillation: Secondary | ICD-10-CM | POA: Diagnosis not present

## 2014-05-15 DIAGNOSIS — S0501XA Injury of conjunctiva and corneal abrasion without foreign body, right eye, initial encounter: Secondary | ICD-10-CM | POA: Insufficient documentation

## 2014-05-15 DIAGNOSIS — Y9301 Activity, walking, marching and hiking: Secondary | ICD-10-CM | POA: Insufficient documentation

## 2014-05-15 DIAGNOSIS — Z833 Family history of diabetes mellitus: Secondary | ICD-10-CM | POA: Diagnosis not present

## 2014-05-15 DIAGNOSIS — R109 Unspecified abdominal pain: Secondary | ICD-10-CM | POA: Diagnosis not present

## 2014-05-15 DIAGNOSIS — G2581 Restless legs syndrome: Secondary | ICD-10-CM

## 2014-05-15 DIAGNOSIS — D72829 Elevated white blood cell count, unspecified: Secondary | ICD-10-CM | POA: Diagnosis not present

## 2014-05-15 DIAGNOSIS — Z9049 Acquired absence of other specified parts of digestive tract: Secondary | ICD-10-CM | POA: Diagnosis not present

## 2014-05-15 DIAGNOSIS — M810 Age-related osteoporosis without current pathological fracture: Secondary | ICD-10-CM | POA: Diagnosis not present

## 2014-05-15 DIAGNOSIS — Z79899 Other long term (current) drug therapy: Secondary | ICD-10-CM

## 2014-05-15 DIAGNOSIS — Z87891 Personal history of nicotine dependence: Secondary | ICD-10-CM

## 2014-05-15 DIAGNOSIS — Z23 Encounter for immunization: Secondary | ICD-10-CM | POA: Diagnosis not present

## 2014-05-15 DIAGNOSIS — J9611 Chronic respiratory failure with hypoxia: Secondary | ICD-10-CM | POA: Diagnosis not present

## 2014-05-15 DIAGNOSIS — K5669 Other intestinal obstruction: Secondary | ICD-10-CM | POA: Diagnosis not present

## 2014-05-15 DIAGNOSIS — Z8249 Family history of ischemic heart disease and other diseases of the circulatory system: Secondary | ICD-10-CM | POA: Diagnosis not present

## 2014-05-15 DIAGNOSIS — Z8711 Personal history of peptic ulcer disease: Secondary | ICD-10-CM | POA: Diagnosis not present

## 2014-05-15 DIAGNOSIS — Z823 Family history of stroke: Secondary | ICD-10-CM | POA: Diagnosis not present

## 2014-05-15 DIAGNOSIS — S63292A Dislocation of distal interphalangeal joint of right middle finger, initial encounter: Secondary | ICD-10-CM | POA: Insufficient documentation

## 2014-05-15 DIAGNOSIS — S63259A Unspecified dislocation of unspecified finger, initial encounter: Secondary | ICD-10-CM

## 2014-05-15 DIAGNOSIS — Z950 Presence of cardiac pacemaker: Secondary | ICD-10-CM | POA: Diagnosis not present

## 2014-05-15 LAB — CBC WITH DIFFERENTIAL/PLATELET
Basophils Absolute: 0 10*3/uL (ref 0.0–0.1)
Basophils Relative: 0 % (ref 0–1)
Eosinophils Absolute: 0.5 10*3/uL (ref 0.0–0.7)
Eosinophils Relative: 5 % (ref 0–5)
HCT: 39.7 % (ref 36.0–46.0)
Hemoglobin: 12.7 g/dL (ref 12.0–15.0)
Lymphocytes Relative: 9 % — ABNORMAL LOW (ref 12–46)
Lymphs Abs: 0.9 10*3/uL (ref 0.7–4.0)
MCH: 28.9 pg (ref 26.0–34.0)
MCHC: 32 g/dL (ref 30.0–36.0)
MCV: 90.2 fL (ref 78.0–100.0)
Monocytes Absolute: 1 10*3/uL (ref 0.1–1.0)
Monocytes Relative: 11 % (ref 3–12)
Neutro Abs: 7.1 10*3/uL (ref 1.7–7.7)
Neutrophils Relative %: 75 % (ref 43–77)
Platelets: 290 10*3/uL (ref 150–400)
RBC: 4.4 MIL/uL (ref 3.87–5.11)
RDW: 13.7 % (ref 11.5–15.5)
WBC: 9.5 10*3/uL (ref 4.0–10.5)

## 2014-05-15 MED ORDER — LIDOCAINE HCL 2 % IJ SOLN
10.0000 mL | Freq: Once | INTRAMUSCULAR | Status: AC
  Administered 2014-05-15: 200 mg
  Filled 2014-05-15: qty 20

## 2014-05-15 MED ORDER — HYDROCODONE-ACETAMINOPHEN 5-325 MG PO TABS: 1.0000 | ORAL_TABLET | ORAL | Status: DC | PRN

## 2014-05-15 MED ORDER — ONDANSETRON HCL 4 MG/2ML IJ SOLN
INTRAMUSCULAR | Status: AC
  Filled 2014-05-15: qty 2

## 2014-05-15 MED ORDER — MORPHINE SULFATE 4 MG/ML IJ SOLN
4.0000 mg | Freq: Once | INTRAMUSCULAR | Status: AC
  Administered 2014-05-15: 4 mg via INTRAVENOUS
  Filled 2014-05-15: qty 1

## 2014-05-15 MED ORDER — ONDANSETRON HCL 4 MG/2ML IJ SOLN
4.0000 mg | Freq: Once | INTRAMUSCULAR | Status: AC
  Administered 2014-05-15: 4 mg via INTRAVENOUS

## 2014-05-15 MED ORDER — CEPHALEXIN 500 MG PO CAPS: 500.0000 mg | ORAL_CAPSULE | Freq: Three times a day (TID) | ORAL | Status: DC

## 2014-05-15 MED ORDER — CEFAZOLIN SODIUM 1 G IJ SOLR
INTRAMUSCULAR | Status: AC
  Filled 2014-05-15: qty 10

## 2014-05-15 MED ORDER — TETANUS-DIPHTH-ACELL PERTUSSIS 5-2.5-18.5 LF-MCG/0.5 IM SUSP
0.5000 mL | Freq: Once | INTRAMUSCULAR | Status: AC
  Administered 2014-05-15: 0.5 mL via INTRAMUSCULAR
  Filled 2014-05-15: qty 0.5

## 2014-05-15 MED ORDER — CEFAZOLIN SODIUM 1-5 GM-% IV SOLN
1.0000 g | Freq: Once | INTRAVENOUS | Status: AC
  Administered 2014-05-15: 1 g via INTRAVENOUS

## 2014-05-15 NOTE — ED Provider Notes (Signed)
CSN: [id number]     Arrival date & time 05/15/14  1322 History  This chart was scribed for Wandra Arthurs, MD by Jeanell Sparrow, ED Scribe. This patient was seen in room MH04/MH04 and the patient's care was started at 3:08 PM.     Chief Complaint  Patient presents with  . Fall   The history is provided by the patient. No language interpreter was used.   HPI Comments: [name] is a 69 y.o. female who presents to the Emergency Department complaining of a fall that occurred about 3 hours ago. She reports that her shoe got caught on the floor while ambulating, she slipped, and hit the floor. She reports that she hit the right side of her head and chest on the floor, but denies any LOC. She states that she has constant moderate pain to her left middle finger. She denies any use of blood thinners. She reports that she is currently on 2 liters of oxygen at home. She states that she has a hx of COPD, fibromyalgia, RBBB, and a pacemaker for an obstruction.   Past Medical History  Diagnosis Date  . GERD (gastroesophageal reflux disease)   . CAD (coronary artery disease)     mild disease per cath in 2011  . Venous embolism and thrombosis of subclavian vein     after pacemaker insertion Oct 2011  . Pleural effusion      s/p right thoracentesis 03/09  . Small bowel obstruction   . Urinary incontinence      -INTERSTIM IMPLANT NOT FUNCTIONING PER PT  . RLS (restless legs syndrome)   . Primary dilated cardiomyopathy     EF 45 to 50% per echo in Jan 2012  . Vitamin B12 deficiency   . Gastroparesis   . Chronic nausea   . Chronic abdominal pain   . Chronic pain syndrome   . Pacemaker     CRT therapy; followed by Dr. Caryl Comes  . H/O: GI bleed     from Pradaxa  . Oxygen dependent     2 liters via nasal cannula at all times  . CHF (congestive heart failure)   . Anemia   . Hx of stress fx aug 2011    right hip   . Elbow fracture, left aug 2012  . Arthritis     right hip  . Hx of gastric ulcer   .  Diverticulitis   . HTN (hypertension)     under control; has been on med. x "years"  . Hx MRSA infection   . Pneumonia - 03/09, 12/09, 02/10, 06/10    MOST RECENT FEB 2013  . Elevated liver enzymes   . Blood transfusion   . Anginal pain   . Shortness of breath   . Fibromyalgia     COUPLE OF TIMES A YEAR-PT HAS EPISODES OF CONFUSION-USUALLY INVOLVES DAY/NIGHT REVERSAL AND EPISODES OF TWITCHING AND FALLS--SEES DR. Jacelyn Grip - NEUROLOGIST-LAST SEEN 07/10/11  . LBBB (left bundle branch block)   . Atrial fibrillation   . COPD (chronic obstructive pulmonary disease)     continuous O2- 2l  . Hepatitis     hx of in high school   . Silent aspiration    Past Surgical History  Procedure Laterality Date  . Appendectomy    . Pacemaker placement  12/21/2009  . Peg removed      with complications  . Tonsillectomy  age 24  . Interstim implant revision  03/06/2011    Procedure: REVISION OF  [name];  Surgeon: Reece Packer, MD;  Location: [facility];  Service: Urology;  Laterality: N/A;  Replacement of Neurostimulator  . Portacath placement  12/16/2009  . Total abdominal hysterectomy      complete  . Interstim implant placement  05/28/2006 - stage I    06/05/2006 - stage II  . Interstim implant revision  10/23/2007  . Cystoscopy with injection  04/30/2006    transurethral collagen injection; incision vaginal stenosis  . Small intestine surgery  05/20/2001    ex. lap., resection of small bowel stricture; gastrostomy; insertion central line  . Cardiac catheterization  04/08/2006, 11/16/2009  . Cystoscopy  11/11/2008  . Cystoscopy w/ retrogrades  11/11/2008    right  . Colectomy      Intestinal resection (5times)  . Colectomy  02/04/2000    ex. lap., intra-abd. subtotal colectomy with ileosigmoid colon anastomosies and lysis of adhesions  . Gastrocutaneous fistula closure    . Exploratory laparotomy  04/27/2009    lysis of adhesions, gastrostomy tube  . Port-a-cath removal  07/27/2011    Procedure: REMOVAL  PORT-A-CATH;  Surgeon: Rolm Bookbinder, MD;  Location: [facility];  Service: General;  Laterality: N/A;  . Total hip arthroplasty  08/03/2011    Procedure: TOTAL HIP ARTHROPLASTY ANTERIOR APPROACH;  Surgeon: Mcarthur Rossetti, MD;  Location: [facility];  Service: Orthopedics;  Laterality: Right;  . Interstim implant placement  02/05/2012    Procedure: INTERSTIM IMPLANT FIRST STAGE;  Surgeon: Reece Packer, MD;  Location: [facility];  Service: Urology;  Laterality: N/A;  Replacement of Interstim Lead     . Esophagogastroduodenoscopy N/A 02/02/2013    Procedure: ESOPHAGOGASTRODUODENOSCOPY (EGD);  Surgeon: Lafayette Dragon, MD;  Location: [facility];  Service: Endoscopy;  Laterality: N/A;  . Savory dilation N/A 02/02/2013    Procedure: SAVORY DILATION;  Surgeon: Lafayette Dragon, MD;  Location: [facility];  Service: Endoscopy;  Laterality: N/A;   Family History  Problem Relation Age of Onset  . Heart disease Father   . Stroke Mother   . Heart disease Mother   . Colon cancer Neg Hx   . Heart disease Brother   . Breast cancer Maternal Aunt   . Heart disease Paternal Aunt   . Diabetes Paternal Grandfather    History  Substance Use Topics  . Smoking status: Former Smoker -- 1.00 packs/day for 20 years    Quit date: 03/19/1986  . Smokeless tobacco: Never Used  . Alcohol Use: No   OB History    No data available     Review of Systems  Musculoskeletal: Positive for myalgias and arthralgias.  Skin: Positive for wound.  Neurological: Negative for syncope.  Hematological: Does not bruise/bleed easily.  All other systems reviewed and are negative.   Allergies  Dabigatran etexilate mesylate; Talwin; Pentazocine lactate; and Sulfonamide derivatives  Home Medications   Prior to Admission medications   Medication Sig Start Date End Date Taking? Authorizing Provider  albuterol (PROAIR HFA) 108 (90 BASE) MCG/ACT inhaler Inhale 2 puffs into the lungs every 6 (six) hours  as needed for wheezing or shortness of breath. 02/08/14   Chesley Mires, MD  AMBULATORY NON FORMULARY MEDICATION Pt takes Oxygen, 2 units, every day    Historical Provider, MD  carvedilol (COREG) 6.25 MG tablet TAKE ONE TABLET BY MOUTH TWICE DAILY WITH MEALS    Minus Breeding, MD  furosemide (LASIX) 40 MG tablet TAKE ONE TABLET BY MOUTH IN THE MORNING 01/04/14   Minus Breeding,  MD  gabapentin (NEURONTIN) 600 MG tablet TAKE ONE TABLET BY MOUTH 4 TIMES DAILY 04/29/14   Rosalita Chessman, DO  loperamide (IMODIUM A-D) 2 MG tablet Take 1/2 tablet by mouth daily as needed for diarrhea. 07/18/12   Lafayette Dragon, MD  MYRBETRIQ 25 MG TB24 tablet Take 1 tablet by mouth daily. 12/30/12   Historical Provider, MD  nitroGLYCERIN (NITROSTAT) 0.4 MG SL tablet Place 0.4 mg under the tongue every 5 (five) minutes as needed. CHEST PAIN 03/14/11   Minus Breeding, MD  oxyCODONE-acetaminophen (PERCOCET) 10-325 MG per tablet Take 1 tablet by mouth every 4 (four) hours as needed. PAIN 04/20/14   Rosalita Chessman, DO  pantoprazole (PROTONIX) 40 MG tablet TAKE ONE TABLET BY MOUTH TWICE DAILY 04/05/14   Lafayette Dragon, MD  promethazine (PHENERGAN) 25 MG tablet Take 1 tablet (25 mg total) by mouth every 8 (eight) hours as needed for nausea. 12/18/13   Lafayette Dragon, MD  tiotropium (SPIRIVA HANDIHALER) 18 MCG inhalation capsule Place 1 capsule (18 mcg total) into inhaler and inhale every morning. 07/24/11   Chesley Mires, MD  tiZANidine (ZANAFLEX) 4 MG tablet Take 1 tablet (4 mg total) by mouth 3 (three) times daily as needed for muscle spasms. 04/21/14   Rosalita Chessman, DO  zolpidem (AMBIEN) 5 MG tablet TAKE ONE TABLET BY MOUTH AT BEDTIME AS NEEDED FOR SLEEP 04/19/14   Alferd Apa Lowne, DO   BP 133/85 mmHg  Pulse 96  Temp(Src) 98 F (36.7 C) (Oral)  Resp 18  SpO2 98% Physical Exam  Constitutional: She is oriented to person, place, and time. She appears well-developed and well-nourished. No distress.  HENT:  Head: Normocephalic.  Abrasion to  right eye with no obvious deformity.   Eyes: EOM are normal. Pupils are equal, round, and reactive to light.  Neck: Neck supple. No tracheal deviation present.  Cardiovascular: Normal rate, regular rhythm and normal heart sounds.  Exam reveals no gallop and no friction rub.   No murmur heard. Pulmonary/Chest: Effort normal and breath sounds normal. No respiratory distress. She has no wheezes. She has no rales.  Abdominal: Soft. There is no tenderness.  Musculoskeletal: Normal range of motion. She exhibits tenderness.  Mild TTP in the right side of ribs.  No cervical spine TTP.  Normal ROM in bilateral hips.  Left 3rd PIP joint has an obvious dislocation with TTP. TTP over the 4th PIP joint with no obvious dislocation.   Neurological: She is alert and oriented to person, place, and time.  Skin: Skin is warm and dry.  Psychiatric: She has a normal mood and affect. Her behavior is normal.  Nursing note and vitals reviewed.   ED Course  Reduction of dislocation Date/Time: 05/15/2014 6:27 PM Performed by: Wandra Arthurs Authorized by: Wandra Arthurs Consent: Verbal consent obtained. Risks and benefits: risks, benefits and alternatives were discussed Consent given by: patient Patient understanding: patient states understanding of the procedure being performed Patient consent: the patient's understanding of the procedure matches consent given Procedure consent: procedure consent matches procedure scheduled Relevant documents: relevant documents present and verified Test results: test results available and properly labeled Required items: required blood products, implants, devices, and special equipment available Patient identity confirmed: verbally with patient Time out: Immediately prior to procedure a "time out" was called to verify the correct patient, procedure, equipment, support staff and site/side marked as required. Preparation: Patient was prepped and draped in the usual sterile  fashion. Local anesthesia used:  yes Local anesthetic: lidocaine 2% without epinephrine Anesthetic total: 2 ml Patient sedated: no Patient tolerance: Patient tolerated the procedure well with no immediate complications Comments: L 3rd finger reduction    (including critical care time) DIAGNOSTIC STUDIES: Oxygen Saturation is 98% on RA, normal by my interpretation.    COORDINATION OF CARE: 3:12 PM- Pt advised of plan for treatment which includes medication, radiology, and labs and pt agrees.      Labs Review Labs Reviewed  CBC WITH DIFFERENTIAL/PLATELET - Abnormal; Notable for the following:    Lymphocytes Relative 9 (*)    All other components within normal limits    Imaging Review Dg Ribs Unilateral W/chest Right  05/15/2014   CLINICAL DATA:  Acute right rib pain after falling at home. Initial encounter.  EXAM: RIGHT RIBS AND CHEST - 3+ VIEW  COMPARISON:  Jul 30, 2011.  FINDINGS: Minimally displaced fracture seen involving the anterior portion of the right tenth rib. There is no evidence of pneumothorax or pleural effusion. Both lungs are clear. Left-sided pacemaker is unchanged in position. Heart size and mediastinal contours are within normal limits.  IMPRESSION: Minimally displaced right tenth rib fracture. No acute cardiopulmonary abnormality seen.   Electronically Signed   By: Marijo Conception, M.D.   On: 05/15/2014 15:37   Dg Hand 2 View Left  05/15/2014   CLINICAL DATA:  Postreduction left middle finger.  EXAM: LEFT HAND - 2 VIEW  COMPARISON:  05/15/2014  FINDINGS: Interval relocation of the proximal interphalangeal joint of the left third finger. There is associated soft tissue swelling. No definite fracture is appreciated. Prominent degenerative changes throughout the interphalangeal joints and in the wrist. Collapse and compression of the trapezium, likely degenerative.  IMPRESSION: Interval relocation of the proximal interphalangeal joint of the left third finger with good  alignment demonstrated. Soft tissue swelling is present. Diffuse degenerative changes.   Electronically Signed   By: Lucienne Capers M.D.   On: 05/15/2014 17:38   Dg Hand Complete Left  05/15/2014   CLINICAL DATA:  Fall  EXAM: LEFT HAND - COMPLETE 3+ VIEW  COMPARISON:  None.  FINDINGS: Posterior dislocation of the 3rd middle phalanx at the PIP joint.  Associated mild cortical irregularity on the oblique view suggests a possible associated fracture of the base of the 3rd middle phalanx.  Additional possible fracture involving the base of the 4th middle phalanx.  Associated soft tissue swelling of the 2nd and 3rd digit.  Degenerative changes of the 1st carpometacarpal joint.  IMPRESSION: Posterior dislocation of the 3rd middle phalanx at the PIP joint, with suspected associated fracture at the base of the middle phalanx.  Possible additional fracture involving the base of the 4th middle phalanx.   Electronically Signed   By: Julian Hy M.D.   On: 05/15/2014 13:55     EKG Interpretation None      MDM   Final diagnoses:  None    [name] is a 69 y.o. female here with fall. Xray showed R 10th rib fracture. Also has R 3rd middle phalanx dislocation and open fracture. I consulted Dr. Burney Gauze, who recommend ER reduction and wash out and dc on abx. I was able to successfully reduce dislocation and washed out the wound. Tdap updated. Given ancef. Will dc home with pain meds, keflex, hand f/u.    I personally performed the services described in this documentation, which was scribed in my presence. The recorded information has been reviewed and is accurate.  Wandra Arthurs, MD 05/15/14 [phone]

## 2014-05-15 NOTE — ED Notes (Signed)
Patient denies pain and is resting comfortably.  

## 2014-05-15 NOTE — Discharge Instructions (Signed)
Take motrin or tylenol for pain.   Take vicodin for severe pain.   Take keflex three times daily for a week.   Keep splint until you see Dr. Burney Gauze next week.   Return to ER if you have severe pain, chest pain, shortness of breath, fingers turning blue.

## 2014-05-15 NOTE — ED Notes (Signed)
Patient reports that she fell while walking inside home and complains of left hand middle finger pain, swelling noted to same. Also hit right side of forehead and right breast. Denies loc.

## 2014-05-15 NOTE — ED Notes (Signed)
Assumed care of patient from [place], [place].

## 2014-05-17 ENCOUNTER — Telehealth: Payer: Self-pay | Admitting: Internal Medicine

## 2014-05-17 ENCOUNTER — Encounter (HOSPITAL_COMMUNITY): Payer: Self-pay

## 2014-05-17 ENCOUNTER — Emergency Department (HOSPITAL_COMMUNITY): Payer: PPO

## 2014-05-17 ENCOUNTER — Ambulatory Visit (HOSPITAL_COMMUNITY): Payer: PPO

## 2014-05-17 ENCOUNTER — Inpatient Hospital Stay (HOSPITAL_COMMUNITY): Payer: PPO

## 2014-05-17 ENCOUNTER — Inpatient Hospital Stay (HOSPITAL_COMMUNITY)
Admission: EM | Admit: 2014-05-17 | Discharge: 2014-05-23 | DRG: 389 | Disposition: A | Discharge status: Home / Self Care | Payer: PPO | Attending: Family Medicine | Admitting: Family Medicine

## 2014-05-17 DIAGNOSIS — G894 Chronic pain syndrome: Secondary | ICD-10-CM | POA: Diagnosis present

## 2014-05-17 DIAGNOSIS — Z90710 Acquired absence of both cervix and uterus: Secondary | ICD-10-CM | POA: Diagnosis not present

## 2014-05-17 DIAGNOSIS — Z9981 Dependence on supplemental oxygen: Secondary | ICD-10-CM

## 2014-05-17 DIAGNOSIS — Z8614 Personal history of Methicillin resistant Staphylococcus aureus infection: Secondary | ICD-10-CM | POA: Diagnosis not present

## 2014-05-17 DIAGNOSIS — Z888 Allergy status to other drugs, medicaments and biological substances status: Secondary | ICD-10-CM

## 2014-05-17 DIAGNOSIS — E538 Deficiency of other specified B group vitamins: Secondary | ICD-10-CM | POA: Diagnosis present

## 2014-05-17 DIAGNOSIS — Z23 Encounter for immunization: Secondary | ICD-10-CM | POA: Diagnosis not present

## 2014-05-17 DIAGNOSIS — Z96641 Presence of right artificial hip joint: Secondary | ICD-10-CM | POA: Diagnosis present

## 2014-05-17 DIAGNOSIS — Z79899 Other long term (current) drug therapy: Secondary | ICD-10-CM | POA: Diagnosis not present

## 2014-05-17 DIAGNOSIS — E872 Acidosis: Secondary | ICD-10-CM | POA: Diagnosis present

## 2014-05-17 DIAGNOSIS — Z86718 Personal history of other venous thrombosis and embolism: Secondary | ICD-10-CM

## 2014-05-17 DIAGNOSIS — D72829 Elevated white blood cell count, unspecified: Secondary | ICD-10-CM | POA: Diagnosis present

## 2014-05-17 DIAGNOSIS — Z87891 Personal history of nicotine dependence: Secondary | ICD-10-CM

## 2014-05-17 DIAGNOSIS — M1990 Unspecified osteoarthritis, unspecified site: Secondary | ICD-10-CM | POA: Diagnosis present

## 2014-05-17 DIAGNOSIS — Z8249 Family history of ischemic heart disease and other diseases of the circulatory system: Secondary | ICD-10-CM | POA: Diagnosis not present

## 2014-05-17 DIAGNOSIS — Z823 Family history of stroke: Secondary | ICD-10-CM

## 2014-05-17 DIAGNOSIS — Z882 Allergy status to sulfonamides status: Secondary | ICD-10-CM | POA: Diagnosis not present

## 2014-05-17 DIAGNOSIS — J9611 Chronic respiratory failure with hypoxia: Secondary | ICD-10-CM | POA: Diagnosis present

## 2014-05-17 DIAGNOSIS — K219 Gastro-esophageal reflux disease without esophagitis: Secondary | ICD-10-CM | POA: Diagnosis present

## 2014-05-17 DIAGNOSIS — M810 Age-related osteoporosis without current pathological fracture: Secondary | ICD-10-CM | POA: Diagnosis present

## 2014-05-17 DIAGNOSIS — K5669 Other intestinal obstruction: Principal | ICD-10-CM | POA: Diagnosis present

## 2014-05-17 DIAGNOSIS — I420 Dilated cardiomyopathy: Secondary | ICD-10-CM | POA: Diagnosis present

## 2014-05-17 DIAGNOSIS — Z9181 History of falling: Secondary | ICD-10-CM

## 2014-05-17 DIAGNOSIS — I10 Essential (primary) hypertension: Secondary | ICD-10-CM | POA: Diagnosis present

## 2014-05-17 DIAGNOSIS — Z8711 Personal history of peptic ulcer disease: Secondary | ICD-10-CM

## 2014-05-17 DIAGNOSIS — I2510 Atherosclerotic heart disease of native coronary artery without angina pectoris: Secondary | ICD-10-CM | POA: Diagnosis present

## 2014-05-17 DIAGNOSIS — R109 Unspecified abdominal pain: Secondary | ICD-10-CM | POA: Diagnosis present

## 2014-05-17 DIAGNOSIS — Z79891 Long term (current) use of opiate analgesic: Secondary | ICD-10-CM

## 2014-05-17 DIAGNOSIS — Z803 Family history of malignant neoplasm of breast: Secondary | ICD-10-CM

## 2014-05-17 DIAGNOSIS — J432 Centrilobular emphysema: Secondary | ICD-10-CM

## 2014-05-17 DIAGNOSIS — I4891 Unspecified atrial fibrillation: Secondary | ICD-10-CM | POA: Diagnosis present

## 2014-05-17 DIAGNOSIS — Z885 Allergy status to narcotic agent status: Secondary | ICD-10-CM | POA: Diagnosis not present

## 2014-05-17 DIAGNOSIS — I5042 Chronic combined systolic (congestive) and diastolic (congestive) heart failure: Secondary | ICD-10-CM | POA: Diagnosis present

## 2014-05-17 DIAGNOSIS — Z833 Family history of diabetes mellitus: Secondary | ICD-10-CM | POA: Diagnosis not present

## 2014-05-17 DIAGNOSIS — Z950 Presence of cardiac pacemaker: Secondary | ICD-10-CM | POA: Diagnosis not present

## 2014-05-17 DIAGNOSIS — I5022 Chronic systolic (congestive) heart failure: Secondary | ICD-10-CM | POA: Diagnosis present

## 2014-05-17 DIAGNOSIS — G2581 Restless legs syndrome: Secondary | ICD-10-CM | POA: Diagnosis present

## 2014-05-17 DIAGNOSIS — M797 Fibromyalgia: Secondary | ICD-10-CM | POA: Diagnosis present

## 2014-05-17 DIAGNOSIS — Z9049 Acquired absence of other specified parts of digestive tract: Secondary | ICD-10-CM | POA: Diagnosis present

## 2014-05-17 DIAGNOSIS — K56609 Unspecified intestinal obstruction, unspecified as to partial versus complete obstruction: Secondary | ICD-10-CM

## 2014-05-17 DIAGNOSIS — R1084 Generalized abdominal pain: Secondary | ICD-10-CM

## 2014-05-17 DIAGNOSIS — J449 Chronic obstructive pulmonary disease, unspecified: Secondary | ICD-10-CM | POA: Diagnosis present

## 2014-05-17 DIAGNOSIS — J439 Emphysema, unspecified: Secondary | ICD-10-CM | POA: Diagnosis present

## 2014-05-17 LAB — CBC WITH DIFFERENTIAL/PLATELET
Basophils Absolute: 0 10*3/uL (ref 0.0–0.1)
Basophils Relative: 0 % (ref 0–1)
Eosinophils Absolute: 0.2 10*3/uL (ref 0.0–0.7)
Eosinophils Relative: 1 % (ref 0–5)
HCT: 38.1 % (ref 36.0–46.0)
Hemoglobin: 12.7 g/dL (ref 12.0–15.0)
Lymphocytes Relative: 7 % — ABNORMAL LOW (ref 12–46)
Lymphs Abs: 1.5 10*3/uL (ref 0.7–4.0)
MCH: 29.8 pg (ref 26.0–34.0)
MCHC: 33.3 g/dL (ref 30.0–36.0)
MCV: 89.4 fL (ref 78.0–100.0)
Monocytes Absolute: 1.7 10*3/uL — ABNORMAL HIGH (ref 0.1–1.0)
Monocytes Relative: 8 % (ref 3–12)
Neutro Abs: 17.9 10*3/uL — ABNORMAL HIGH (ref 1.7–7.7)
Neutrophils Relative %: 84 % — ABNORMAL HIGH (ref 43–77)
Platelets: 352 10*3/uL (ref 150–400)
RBC: 4.26 MIL/uL (ref 3.87–5.11)
RDW: 13.8 % (ref 11.5–15.5)
WBC: 21.3 10*3/uL — ABNORMAL HIGH (ref 4.0–10.5)

## 2014-05-17 LAB — URINALYSIS, ROUTINE W REFLEX MICROSCOPIC
Glucose, UA: NEGATIVE mg/dL
Ketones, ur: NEGATIVE mg/dL
Nitrite: NEGATIVE
Protein, ur: NEGATIVE mg/dL
Specific Gravity, Urine: 1.021 (ref 1.005–1.030)
Urobilinogen, UA: 0.2 mg/dL (ref 0.0–1.0)
pH: 5 (ref 5.0–8.0)

## 2014-05-17 LAB — COMPREHENSIVE METABOLIC PANEL
ALT: 21 U/L (ref 0–35)
AST: 24 U/L (ref 0–37)
Albumin: 3.3 g/dL — ABNORMAL LOW (ref 3.5–5.2)
Alkaline Phosphatase: 94 U/L (ref 39–117)
Anion gap: 8 (ref 5–15)
BUN: 28 mg/dL — ABNORMAL HIGH (ref 6–23)
CO2: 26 mmol/L (ref 19–32)
Calcium: 5.6 mg/dL — CL (ref 8.4–10.5)
Chloride: 104 mmol/L (ref 96–112)
Creatinine, Ser: 0.87 mg/dL (ref 0.50–1.10)
GFR calc Af Amer: 77 mL/min — ABNORMAL LOW (ref >90)
GFR calc non Af Amer: 66 mL/min — ABNORMAL LOW (ref >90)
Glucose, Bld: 133 mg/dL — ABNORMAL HIGH (ref 70–99)
Potassium: 5 mmol/L (ref 3.5–5.1)
Sodium: 138 mmol/L (ref 135–145)
Total Bilirubin: 0.3 mg/dL (ref 0.3–1.2)
Total Protein: 6.4 g/dL (ref 6.0–8.3)

## 2014-05-17 LAB — URINE MICROSCOPIC-ADD ON

## 2014-05-17 LAB — PHOSPHORUS: Phosphorus: 3 mg/dL (ref 2.3–4.6)

## 2014-05-17 LAB — MAGNESIUM: Magnesium: 2 mg/dL (ref 1.5–2.5)

## 2014-05-17 MED ORDER — IOHEXOL 300 MG/ML  SOLN
50.0000 mL | Freq: Once | INTRAMUSCULAR | Status: AC | PRN
  Administered 2014-05-17: 50 mL via ORAL

## 2014-05-17 MED ORDER — HYDROMORPHONE HCL 1 MG/ML IJ SOLN
1.0000 mg | Freq: Once | INTRAMUSCULAR | Status: AC
  Administered 2014-05-17: 1 mg via INTRAVENOUS
  Filled 2014-05-17: qty 1

## 2014-05-17 MED ORDER — METOPROLOL TARTRATE 1 MG/ML IV SOLN
2.5000 mg | Freq: Four times a day (QID) | INTRAVENOUS | Status: DC
  Administered 2014-05-17 – 2014-05-21 (×15): 2.5 mg via INTRAVENOUS
  Filled 2014-05-17 (×19): qty 5

## 2014-05-17 MED ORDER — ONDANSETRON HCL 4 MG/2ML IJ SOLN
4.0000 mg | Freq: Once | INTRAMUSCULAR | Status: AC
  Administered 2014-05-17: 4 mg via INTRAVENOUS
  Filled 2014-05-17: qty 2

## 2014-05-17 MED ORDER — HYDROMORPHONE HCL 1 MG/ML IJ SOLN
1.0000 mg | INTRAMUSCULAR | Status: DC | PRN
  Administered 2014-05-17 – 2014-05-20 (×20): 1 mg via INTRAVENOUS
  Filled 2014-05-17 (×20): qty 1

## 2014-05-17 MED ORDER — ALBUTEROL SULFATE (2.5 MG/3ML) 0.083% IN NEBU: 2.5000 mg | INHALATION_SOLUTION | Freq: Four times a day (QID) | RESPIRATORY_TRACT | Status: DC | PRN

## 2014-05-17 MED ORDER — IOHEXOL 300 MG/ML  SOLN
80.0000 mL | Freq: Once | INTRAMUSCULAR | Status: AC | PRN
  Administered 2014-05-17: 80 mL via INTRAVENOUS

## 2014-05-17 MED ORDER — ACETAMINOPHEN 325 MG PO TABS
650.0000 mg | ORAL_TABLET | Freq: Four times a day (QID) | ORAL | Status: DC | PRN
  Administered 2014-05-19: 650 mg via ORAL
  Filled 2014-05-17: qty 2

## 2014-05-17 MED ORDER — SODIUM CHLORIDE 0.9 % IV SOLN
INTRAVENOUS | Status: DC
  Administered 2014-05-17 – 2014-05-19 (×2): via INTRAVENOUS

## 2014-05-17 MED ORDER — ONDANSETRON HCL 4 MG/2ML IJ SOLN
4.0000 mg | Freq: Four times a day (QID) | INTRAMUSCULAR | Status: DC | PRN
  Administered 2014-05-17 – 2014-05-21 (×11): 4 mg via INTRAVENOUS
  Filled 2014-05-17 (×12): qty 2

## 2014-05-17 MED ORDER — SODIUM CHLORIDE 0.9 % IV SOLN
1.0000 g | Freq: Once | INTRAVENOUS | Status: AC
  Administered 2014-05-17: 1 g via INTRAVENOUS
  Filled 2014-05-17: qty 10

## 2014-05-17 MED ORDER — ONDANSETRON HCL 4 MG PO TABS
4.0000 mg | ORAL_TABLET | Freq: Four times a day (QID) | ORAL | Status: DC | PRN
  Administered 2014-05-21 – 2014-05-23 (×3): 4 mg via ORAL
  Filled 2014-05-17 (×3): qty 1

## 2014-05-17 MED ORDER — TIOTROPIUM BROMIDE MONOHYDRATE 18 MCG IN CAPS
18.0000 ug | ORAL_CAPSULE | Freq: Every day | RESPIRATORY_TRACT | Status: DC
  Administered 2014-05-18 – 2014-05-22 (×5): 18 ug via RESPIRATORY_TRACT
  Filled 2014-05-17: qty 5

## 2014-05-17 MED ORDER — ACETAMINOPHEN 650 MG RE SUPP
650.0000 mg | Freq: Four times a day (QID) | RECTAL | Status: DC | PRN
  Administered 2014-05-20 (×2): 650 mg via RECTAL
  Filled 2014-05-17 (×3): qty 1

## 2014-05-17 NOTE — ED Notes (Signed)
IV placed, unable to draw blood back. Phlebotomy made aware, will attempt.

## 2014-05-17 NOTE — ED Notes (Signed)
Per pt, starting a week ago with potential bowel obstruction.  Hx of same.  Abdominal pain with n/v.  Two days ago seen for broken fingers.  Pt does take oxycontin at home.  No fever.

## 2014-05-17 NOTE — H&P (Signed)
[facility] History and Physical  [name] WUJ:[id number] DOB: Oct 02, 1945 DOA: 05/17/2014  Referring physician: Mr. Montine Circle, PA PCP: Becky Koyanagi, DO  Specialists:   Chief Complaint: Abdominal pain, nausea vomiting  HPI: [name] is a 69 y.o. female  With history of small bowel obstruction, coronary artery disease, heart failure, presented to emergency room with complaints of abdominal pain, nausea, vomiting. One week ago, patient began to have abdominal pain however it subsided over the next few days. Patient did have her last bowel movement  this past Thursday (05/13/2014).  Patient's pain however then returned yesterday evening when she began to have vomiting. Patient states this has occurred in the past multiple times however she normally tries to "wait it out."  Patient recently came to the emergency department on 05/15/2014 after suffering a fall. At that time she was found to have a rib fracture with a right hand fracture. In the emergency department, CT scan of the abdomen showed partial small bowel obstruction. General surgery was called. [facility] was asked to admit.  Review of Systems:  Constitutional: Denies fever, chills, diaphoresis, appetite change and fatigue.  HEENT: Denies photophobia, eye pain, redness, hearing loss, ear pain, congestion, sore throat, rhinorrhea, sneezing, mouth sores, trouble swallowing, neck pain, neck stiffness and tinnitus.   Respiratory: Denies SOB, DOE, cough, chest tightness,  and wheezing.   Cardiovascular: Denies chest pain, palpitations and leg swelling.  Gastrointestinal: Complains of abdominal pain, nausea, vomiting.  Genitourinary: Denies dysuria, urgency, frequency, hematuria, flank pain and difficulty urinating.  Musculoskeletal: Denies myalgias, back pain, joint swelling, arthralgias and gait problem.  Skin: Denies pallor, rash and wound.  Neurological: Denies dizziness, seizures, syncope, weakness, light-headedness, numbness  and headaches.  Hematological: Denies adenopathy. Easy bruising, personal or family bleeding history  Psychiatric/Behavioral: Denies suicidal ideation, mood changes, confusion, nervousness, sleep disturbance and agitation  Past Medical History  Diagnosis Date  . GERD (gastroesophageal reflux disease)   . CAD (coronary artery disease)     mild disease per cath in 2011  . Venous embolism and thrombosis of subclavian vein     after pacemaker insertion Oct 2011  . Pleural effusion      s/p right thoracentesis 03/09  . Small bowel obstruction   . Urinary incontinence      -INTERSTIM IMPLANT NOT FUNCTIONING PER PT  . RLS (restless legs syndrome)   . Primary dilated cardiomyopathy     EF 45 to 50% per echo in Jan 2012  . Vitamin B12 deficiency   . Gastroparesis   . Chronic nausea   . Chronic abdominal pain   . Chronic pain syndrome   . Pacemaker     CRT therapy; followed by [name]  . H/O: GI bleed     from Pradaxa  . Oxygen dependent     2 liters via nasal cannula at all times  . CHF (congestive heart failure)   . Anemia   . Hx of stress fx aug 2011    right hip   . Elbow fracture, left aug 2012  . Arthritis     right hip  . Hx of gastric ulcer   . Diverticulitis   . HTN (hypertension)     under control; has been on med. x "years"  . Hx MRSA infection   . Pneumonia - 03/09, 12/09, 02/10, 06/10    MOST RECENT FEB 2013  . Elevated liver enzymes   . Blood transfusion   . Anginal pain   . Shortness of  breath   . Fibromyalgia     COUPLE OF TIMES A YEAR-PT HAS EPISODES OF CONFUSION-USUALLY INVOLVES DAY/NIGHT REVERSAL AND EPISODES OF TWITCHING AND FALLS--SEES [name] - NEUROLOGIST-LAST SEEN 07/10/11  . LBBB (left bundle branch block)   . Atrial fibrillation   . COPD (chronic obstructive pulmonary disease)     continuous O2- 2l  . Hepatitis     hx of in high school   . Silent aspiration    Past Surgical History  Procedure Laterality Date  . Appendectomy    . Pacemaker  placement  12/21/2009  . Peg removed      with complications  . Tonsillectomy  age 18  . Interstim implant revision  03/06/2011    Procedure: REVISION OF [name];  Surgeon: Becky Packer, MD;  Location: [facility];  Service: Urology;  Laterality: N/A;  Replacement of Neurostimulator  . Portacath placement  12/16/2009  . Total abdominal hysterectomy      complete  . Interstim implant placement  05/28/2006 - stage I    06/05/2006 - stage II  . Interstim implant revision  10/23/2007  . Cystoscopy with injection  04/30/2006    transurethral collagen injection; incision vaginal stenosis  . Small intestine surgery  05/20/2001    ex. lap., resection of small bowel stricture; gastrostomy; insertion central line  . Cardiac catheterization  04/08/2006, 11/16/2009  . Cystoscopy  11/11/2008  . Cystoscopy w/ retrogrades  11/11/2008    right  . Colectomy      Intestinal resection (5times)  . Colectomy  02/04/2000    ex. lap., intra-abd. subtotal colectomy with ileosigmoid colon anastomosies and lysis of adhesions  . Gastrocutaneous fistula closure    . Exploratory laparotomy  04/27/2009    lysis of adhesions, gastrostomy tube  . Port-a-cath removal  07/27/2011    Procedure: REMOVAL PORT-A-CATH;  Surgeon: Becky Bookbinder, MD;  Location: [facility];  Service: General;  Laterality: N/A;  . Total hip arthroplasty  08/03/2011    Procedure: TOTAL HIP ARTHROPLASTY ANTERIOR APPROACH;  Surgeon: Becky Rossetti, MD;  Location: [facility];  Service: Orthopedics;  Laterality: Right;  . Interstim implant placement  02/05/2012    Procedure: INTERSTIM IMPLANT FIRST STAGE;  Surgeon: Becky Packer, MD;  Location: [facility];  Service: Urology;  Laterality: N/A;  Replacement of Interstim Lead     . Esophagogastroduodenoscopy N/A 02/02/2013    Procedure: ESOPHAGOGASTRODUODENOSCOPY (EGD);  Surgeon: Becky Dragon, MD;  Location: [facility];  Service: Endoscopy;  Laterality: N/A;  . Savory dilation N/A  02/02/2013    Procedure: SAVORY DILATION;  Surgeon: Becky Dragon, MD;  Location: [facility];  Service: Endoscopy;  Laterality: N/A;   Social History:  reports that she quit smoking about 28 years ago. She has never used smokeless tobacco. She reports that she does not drink alcohol or use illicit drugs.   Allergies  Allergen Reactions  . Dabigatran Etexilate Mesylate Other (See Comments)    INTERNAL BLEEDING-pradaxa  . Talwin [Pentazocine] Other (See Comments)    hallucinations  . Pentazocine Lactate Other (See Comments)    HALLUCINATIONS  . Sulfonamide Derivatives Other (See Comments)    JITTERINESS     Family History  Problem Relation Age of Onset  . Heart disease Father   . Stroke Mother   . Heart disease Mother   . Colon cancer Neg Hx   . Heart disease Brother   . Breast cancer Maternal Aunt   . Heart disease Paternal Aunt   .  Diabetes Paternal Grandfather     Prior to Admission medications   Medication Sig Start Date End Date Taking? Authorizing Provider  albuterol (PROAIR HFA) 108 (90 BASE) MCG/ACT inhaler Inhale 2 puffs into the lungs every 6 (six) hours as needed for wheezing or shortness of breath. 02/08/14  Yes Chesley Mires, MD  AMBULATORY NON FORMULARY MEDICATION Pt takes Oxygen, 2 units, every day   Yes Historical Provider, MD  carvedilol (COREG) 6.25 MG tablet TAKE ONE TABLET BY MOUTH TWICE DAILY WITH MEALS   Yes Minus Breeding, MD  furosemide (LASIX) 40 MG tablet TAKE ONE TABLET BY MOUTH IN THE MORNING 01/04/14  Yes Minus Breeding, MD  gabapentin (NEURONTIN) 600 MG tablet TAKE ONE TABLET BY MOUTH 4 TIMES DAILY 04/29/14  Yes Rosalita Chessman, DO  loperamide (IMODIUM A-D) 2 MG tablet Take 1/2 tablet by mouth daily as needed for diarrhea. 07/18/12  Yes Becky Dragon, MD  MYRBETRIQ 25 MG TB24 tablet Take 1 tablet by mouth daily. 12/30/12  Yes Historical Provider, MD  nitroGLYCERIN (NITROSTAT) 0.4 MG SL tablet Place 0.4 mg under the tongue every 5 (five) minutes as  needed. CHEST PAIN 03/14/11  Yes Minus Breeding, MD  oxyCODONE-acetaminophen (PERCOCET) 10-325 MG per tablet Take 1 tablet by mouth every 4 (four) hours as needed. PAIN 04/20/14  Yes Yvonne R Lowne, DO  pantoprazole (PROTONIX) 40 MG tablet TAKE ONE TABLET BY MOUTH TWICE DAILY 04/05/14  Yes Becky Dragon, MD  promethazine (PHENERGAN) 25 MG tablet Take 1 tablet (25 mg total) by mouth every 8 (eight) hours as needed for nausea. 12/18/13  Yes Becky Dragon, MD  tiZANidine (ZANAFLEX) 4 MG tablet Take 1 tablet (4 mg total) by mouth 3 (three) times daily as needed for muscle spasms. 04/21/14  Yes Rosalita Chessman, DO  zolpidem (AMBIEN) 5 MG tablet TAKE ONE TABLET BY MOUTH AT BEDTIME AS NEEDED FOR SLEEP 04/19/14  Yes Yvonne R Lowne, DO  cephALEXin (KEFLEX) 500 MG capsule Take 1 capsule (500 mg total) by mouth 3 (three) times daily. Patient not taking: Reported on 05/17/2014 05/15/14   Wandra Arthurs, MD  HYDROcodone-acetaminophen (NORCO/VICODIN) 5-325 MG per tablet Take 1-2 tablets by mouth every 4 (four) hours as needed. Patient not taking: Reported on 05/17/2014 05/15/14   Wandra Arthurs, MD  tiotropium (SPIRIVA HANDIHALER) 18 MCG inhalation capsule Place 1 capsule (18 mcg total) into inhaler and inhale every morning. Patient taking differently: Place 18 mcg into inhaler and inhale daily as needed (shortness of breath).  07/24/11   Chesley Mires, MD   Physical Exam: Filed Vitals:   05/17/14 1442  BP: 112/58  Pulse: 92  Temp:   Resp: 16     General: Well developed, thin, appears stated age  HEENT: NCAT, PERRLA, EOMI, Anicteic Sclera, mucous membranes moist. Nasal canula in place  Neck: Supple, no JVD, no masses  Cardiovascular: S1 S2 auscultated, no rubs, murmurs or gallops. Regular rate and rhythm.  Respiratory: Clear to auscultation bilaterally with equal chest rise  Abdomen: Soft, mildly diffuse tenderness, mildly distended, + bowel sounds  Extremities: warm dry without cyanosis clubbing or edema, Right Digit  in Finger Splint  Neuro: AAOx3, cranial nerves grossly intact. Strength 5/5 in patient's upper and lower extremities bilaterally  Skin: Without rashes exudates or nodules  Psych: Normal affect and demeanor with intact judgement and insight  Labs on Admission:  Basic Metabolic Panel:  Recent Labs Lab 05/17/14 1136  NA 138  K 5.0  CL 104  CO2 26  GLUCOSE 133*  BUN 28*  CREATININE 0.87  CALCIUM 5.6*   Liver Function Tests:  Recent Labs Lab 05/17/14 1136  AST 24  ALT 21  ALKPHOS 94  BILITOT 0.3  PROT 6.4  ALBUMIN 3.3*   No results for input(s): LIPASE, AMYLASE in the last 168 hours. No results for input(s): AMMONIA in the last 168 hours. CBC:  Recent Labs Lab 05/15/14 1601 05/17/14 1136  WBC 9.5 21.3*  NEUTROABS 7.1 17.9*  HGB 12.7 12.7  HCT 39.7 38.1  MCV 90.2 89.4  PLT 290 352   Cardiac Enzymes: No results for input(s): CKTOTAL, CKMB, CKMBINDEX, TROPONINI in the last 168 hours.  BNP (last 3 results) No results for input(s): BNP in the last 8760 hours.  ProBNP (last 3 results) No results for input(s): PROBNP in the last 8760 hours.  CBG: No results for input(s): GLUCAP in the last 168 hours.  Radiological Exams on Admission: Ct Abdomen Pelvis W Contrast  05/17/2014   CLINICAL DATA:  Generalized abdominal pain.  EXAM: CT ABDOMEN AND PELVIS WITH CONTRAST  TECHNIQUE: Multidetector CT imaging of the abdomen and pelvis was performed using the standard protocol following bolus administration of intravenous contrast.  CONTRAST:  37mL OMNIPAQUE IOHEXOL 300 MG/ML  SOLN  COMPARISON:  CT scan of December 11, 2011.  FINDINGS: Moderate degenerative disc disease is noted at L4-5 with grade 1 anterolisthesis secondary to posterior facet joint hypertrophy. Status post right total hip arthroplasty. Visualized lung bases appear normal.  No gallstones are noted. Stable intrahepatic and extrahepatic biliary dilatation is noted without definite cause of obstruction. The  spleen and pancreas appear normal. Adrenal glands and kidneys appear normal without hydronephrosis or renal obstruction. There remains severe proximal small bowel dilatation concerning for distal small bowel obstruction, although definite transition zone is not identified. Stool is noted in the sigmoid colon. Atherosclerotic calcifications of abdominal aorta are noted without aneurysm formation. No abnormal fluid collection is noted. Urinary bladder appears normal. Status post hysterectomy.  IMPRESSION: Stable intrahepatic and extrahepatic biliary dilatation is noted without definite cause of obstruction.  Severe proximal small bowel dilatation is noted without definite transition zone identified. This is concerning for partial distal small bowel obstruction.   Electronically Signed   By: Marijo Conception, M.D.   On: 05/17/2014 14:15   Dg Hand 2 View Left  05/15/2014   CLINICAL DATA:  Postreduction left middle finger.  EXAM: LEFT HAND - 2 VIEW  COMPARISON:  05/15/2014  FINDINGS: Interval relocation of the proximal interphalangeal joint of the left third finger. There is associated soft tissue swelling. No definite fracture is appreciated. Prominent degenerative changes throughout the interphalangeal joints and in the wrist. Collapse and compression of the trapezium, likely degenerative.  IMPRESSION: Interval relocation of the proximal interphalangeal joint of the left third finger with good alignment demonstrated. Soft tissue swelling is present. Diffuse degenerative changes.   Electronically Signed   By: Lucienne Capers M.D.   On: 05/15/2014 17:38    EKG: None  Assessment/Plan Abdominal pain/Nausea/VomSmall Bowel Obstruction -Patient will be admitted to the medical floor. -CT of the abdomen: Stable intra-and extrahepatic biliary dilatation, without definite cause of obstruction. Severe proximal small bowel dilatation without definite transition zone -Likely secondary to previous surgeries with  adhesions -Gen. surgery consult appreciated -Will order NG tube, set to intermittent suction -Will continue conservative management with pain control, anti-emetics, NPO -Will obtain echocardiogram, CXR, EKG  Essential hypertension -Currently stable, will hold Lasix and Coreg at  this time -Will place on metoprolol 2.5 mg IV every 6 hours  History of coronary artery disease -Currently stable, patient denies any chest pain at this time -Will place patient on metoprolol 2.5 mg IV every 6 hours -Of note, patient states that she cannot take aspirin or any type of blood thinners due to history of bleeding  Leukocytosis -Likely reactive -Patient currently afebrile, with no complaints of urinary symptoms, cough -Will obtain blood cultures as well as chest x-ray -Will not start antibiotics  Asymptomatic bacteriuria -UA: WBC 7-10, negative nitrites, small leukocytes -Patient denies any dysuria or other urinary symptoms  History of systolic and diastolic heart failure -Echocardiogram in January 2012 showed a grade 1 diastolic dysfunction with an EF of 45 50% -Patient currently appears to be euvolemic -Will monitor daily weights, intake and output -Will obtain repeat echocardiogram in case patient does need to be cleared for surgery -Lasix will be held for the time being  History of COPD, oxygen dependent -Currently euvolemic and stable -Continue nasal cannula, 2 L -Patient has no wheezing or complaints of shortness of breath -Continue albuterol as well as Spiriva -Will obtain chest x-ray  Hypocalcemia -Calcium 5.6, correct 6.16 -Will replace -May possible contribute to SBO?  Recent fall -Patient presented to emergency department on 05/15/2014 after sustaining a fall, x-ray showed a right 10th rib fracture -Patient also experienced a third middle phalanx dislocation with open fracture -Dr. Burney Gauze recommended ER reduction with washout -In her right hand appears to be stable,  currently has finger splint -Will consult wound care  DVT prophylaxis: SCDs  Code Status: Full  Condition: Guarded  Family Communication: None at bedside. Admission, patients condition and plan of care including tests being ordered have been discussed with the patient, who indicates understanding and agrees with the plan and Code Status.  Disposition Plan: Admitted  Time spent:65 minutes  Armoni Kludt D.O. [facility] Pager [phone]  If 7PM-7AM, please contact night-coverage [email or web address] Password [facility] 05/17/2014, 3:32 PM

## 2014-05-17 NOTE — ED Provider Notes (Signed)
CSN: [id number]     Arrival date & time 05/17/14  1030 History   First MD Initiated Contact with Patient 05/17/14 1111     Chief Complaint  Patient presents with  . Abdominal Pain  . Emesis     (Consider location/radiation/quality/duration/timing/severity/associated sxs/prior Treatment) HPI Comments: Patient presents to the emergency department with chief complaints of abdominal pain. She states that she has multiple prior abdominal surgeries and has had multiple small bowel obstructions. She complains of moderate to severe pain that started about a week ago. She reports associated nausea, vomiting, and irregular bowel movements. She denies any chest pain, shortness of breath, hematemesis, hematochezia, or melena. She has not tried taking anything to alleviate her symptoms. Her prior surgeries have been performed by Dr. Donne Hazel, and her gastroenterologist is Dr. Maurene Capes.  The history is provided by the patient. No language interpreter was used.    Past Medical History  Diagnosis Date  . GERD (gastroesophageal reflux disease)   . CAD (coronary artery disease)     mild disease per cath in 2011  . Venous embolism and thrombosis of subclavian vein     after pacemaker insertion Oct 2011  . Pleural effusion      s/p right thoracentesis 03/09  . Small bowel obstruction   . Urinary incontinence      -INTERSTIM IMPLANT NOT FUNCTIONING PER PT  . RLS (restless legs syndrome)   . Primary dilated cardiomyopathy     EF 45 to 50% per echo in Jan 2012  . Vitamin B12 deficiency   . Gastroparesis   . Chronic nausea   . Chronic abdominal pain   . Chronic pain syndrome   . Pacemaker     CRT therapy; followed by Dr. Caryl Comes  . H/O: GI bleed     from Pradaxa  . Oxygen dependent     2 liters via nasal cannula at all times  . CHF (congestive heart failure)   . Anemia   . Hx of stress fx aug 2011    right hip   . Elbow fracture, left aug 2012  . Arthritis     right hip  . Hx of gastric ulcer    . Diverticulitis   . HTN (hypertension)     under control; has been on med. x "years"  . Hx MRSA infection   . Pneumonia - 03/09, 12/09, 02/10, 06/10    MOST RECENT FEB 2013  . Elevated liver enzymes   . Blood transfusion   . Anginal pain   . Shortness of breath   . Fibromyalgia     COUPLE OF TIMES A YEAR-PT HAS EPISODES OF CONFUSION-USUALLY INVOLVES DAY/NIGHT REVERSAL AND EPISODES OF TWITCHING AND FALLS--SEES DR. Jacelyn Grip - NEUROLOGIST-LAST SEEN 07/10/11  . LBBB (left bundle branch block)   . Atrial fibrillation   . COPD (chronic obstructive pulmonary disease)     continuous O2- 2l  . Hepatitis     hx of in high school   . Silent aspiration    Past Surgical History  Procedure Laterality Date  . Appendectomy    . Pacemaker placement  12/21/2009  . Peg removed      with complications  . Tonsillectomy  age 19  . Interstim implant revision  03/06/2011    Procedure: REVISION OF [name];  Surgeon: Reece Packer, MD;  Location: [facility];  Service: Urology;  Laterality: N/A;  Replacement of Neurostimulator  . Portacath placement  12/16/2009  . Total abdominal hysterectomy  complete  . Interstim implant placement  05/28/2006 - stage I    06/05/2006 - stage II  . Interstim implant revision  10/23/2007  . Cystoscopy with injection  04/30/2006    transurethral collagen injection; incision vaginal stenosis  . Small intestine surgery  05/20/2001    ex. lap., resection of small bowel stricture; gastrostomy; insertion central line  . Cardiac catheterization  04/08/2006, 11/16/2009  . Cystoscopy  11/11/2008  . Cystoscopy w/ retrogrades  11/11/2008    right  . Colectomy      Intestinal resection (5times)  . Colectomy  02/04/2000    ex. lap., intra-abd. subtotal colectomy with ileosigmoid colon anastomosies and lysis of adhesions  . Gastrocutaneous fistula closure    . Exploratory laparotomy  04/27/2009    lysis of adhesions, gastrostomy tube  . Port-a-cath removal  07/27/2011    Procedure:  REMOVAL PORT-A-CATH;  Surgeon: Rolm Bookbinder, MD;  Location: [facility];  Service: General;  Laterality: N/A;  . Total hip arthroplasty  08/03/2011    Procedure: TOTAL HIP ARTHROPLASTY ANTERIOR APPROACH;  Surgeon: Mcarthur Rossetti, MD;  Location: [facility];  Service: Orthopedics;  Laterality: Right;  . Interstim implant placement  02/05/2012    Procedure: INTERSTIM IMPLANT FIRST STAGE;  Surgeon: Reece Packer, MD;  Location: [facility];  Service: Urology;  Laterality: N/A;  Replacement of Interstim Lead     . Esophagogastroduodenoscopy N/A 02/02/2013    Procedure: ESOPHAGOGASTRODUODENOSCOPY (EGD);  Surgeon: Lafayette Dragon, MD;  Location: [facility];  Service: Endoscopy;  Laterality: N/A;  . Savory dilation N/A 02/02/2013    Procedure: SAVORY DILATION;  Surgeon: Lafayette Dragon, MD;  Location: [facility];  Service: Endoscopy;  Laterality: N/A;   Family History  Problem Relation Age of Onset  . Heart disease Father   . Stroke Mother   . Heart disease Mother   . Colon cancer Neg Hx   . Heart disease Brother   . Breast cancer Maternal Aunt   . Heart disease Paternal Aunt   . Diabetes Paternal Grandfather    History  Substance Use Topics  . Smoking status: Former Smoker -- 1.00 packs/day for 20 years    Quit date: 03/19/1986  . Smokeless tobacco: Never Used  . Alcohol Use: No   OB History    No data available     Review of Systems  Constitutional: Negative for fever and chills.  Respiratory: Negative for shortness of breath.   Cardiovascular: Negative for chest pain.  Gastrointestinal: Positive for abdominal pain. Negative for nausea, vomiting, diarrhea and constipation.  Genitourinary: Negative for dysuria.  All other systems reviewed and are negative.     Allergies  Dabigatran etexilate mesylate; Talwin; Pentazocine lactate; and Sulfonamide derivatives  Home Medications   Prior to Admission medications   Medication Sig Start Date End Date  Taking? Authorizing Provider  albuterol (PROAIR HFA) 108 (90 BASE) MCG/ACT inhaler Inhale 2 puffs into the lungs every 6 (six) hours as needed for wheezing or shortness of breath. 02/08/14   Chesley Mires, MD  AMBULATORY NON FORMULARY MEDICATION Pt takes Oxygen, 2 units, every day    Historical Provider, MD  carvedilol (COREG) 6.25 MG tablet TAKE ONE TABLET BY MOUTH TWICE DAILY WITH MEALS    Minus Breeding, MD  cephALEXin (KEFLEX) 500 MG capsule Take 1 capsule (500 mg total) by mouth 3 (three) times daily. 05/15/14   Wandra Arthurs, MD  furosemide (LASIX) 40 MG tablet TAKE ONE TABLET BY MOUTH IN THE  MORNING 01/04/14   Minus Breeding, MD  gabapentin (NEURONTIN) 600 MG tablet TAKE ONE TABLET BY MOUTH 4 TIMES DAILY 04/29/14   Rosalita Chessman, DO  HYDROcodone-acetaminophen (NORCO/VICODIN) 5-325 MG per tablet Take 1-2 tablets by mouth every 4 (four) hours as needed. 05/15/14   Wandra Arthurs, MD  loperamide (IMODIUM A-D) 2 MG tablet Take 1/2 tablet by mouth daily as needed for diarrhea. 07/18/12   Lafayette Dragon, MD  MYRBETRIQ 25 MG TB24 tablet Take 1 tablet by mouth daily. 12/30/12   Historical Provider, MD  nitroGLYCERIN (NITROSTAT) 0.4 MG SL tablet Place 0.4 mg under the tongue every 5 (five) minutes as needed. CHEST PAIN 03/14/11   Minus Breeding, MD  oxyCODONE-acetaminophen (PERCOCET) 10-325 MG per tablet Take 1 tablet by mouth every 4 (four) hours as needed. PAIN 04/20/14   Rosalita Chessman, DO  pantoprazole (PROTONIX) 40 MG tablet TAKE ONE TABLET BY MOUTH TWICE DAILY 04/05/14   Lafayette Dragon, MD  promethazine (PHENERGAN) 25 MG tablet Take 1 tablet (25 mg total) by mouth every 8 (eight) hours as needed for nausea. 12/18/13   Lafayette Dragon, MD  tiotropium (SPIRIVA HANDIHALER) 18 MCG inhalation capsule Place 1 capsule (18 mcg total) into inhaler and inhale every morning. 07/24/11   Chesley Mires, MD  tiZANidine (ZANAFLEX) 4 MG tablet Take 1 tablet (4 mg total) by mouth 3 (three) times daily as needed for muscle spasms. 04/21/14    Rosalita Chessman, DO  zolpidem (AMBIEN) 5 MG tablet TAKE ONE TABLET BY MOUTH AT BEDTIME AS NEEDED FOR SLEEP 04/19/14   Alferd Apa Lowne, DO   BP 111/68 mmHg  Pulse 108  Temp(Src) 98 F (36.7 C) (Oral)  Resp 18  SpO2 99% Physical Exam  Constitutional: She is oriented to person, place, and time. She appears well-developed and well-nourished.  HENT:  Head: Normocephalic and atraumatic.  Eyes: Conjunctivae and EOM are normal. Pupils are equal, round, and reactive to light.  Neck: Normal range of motion. Neck supple.  Cardiovascular: Normal rate and regular rhythm.  Exam reveals no gallop and no friction rub.   No murmur heard. Pulmonary/Chest: Effort normal and breath sounds normal. No respiratory distress. She has no wheezes. She has no rales. She exhibits no tenderness.  Abdominal: Soft. Bowel sounds are normal. She exhibits no distension and no mass. There is tenderness. There is no rebound and no guarding.  Musculoskeletal: Normal range of motion. She exhibits no edema or tenderness.  Neurological: She is alert and oriented to person, place, and time.  Skin: Skin is warm and dry.  Psychiatric: She has a normal mood and affect. Her behavior is normal. Judgment and thought content normal.  Nursing note and vitals reviewed.   ED Course  Procedures (including critical care time) Results for orders placed or performed during the hospital encounter of 05/17/14  CBC with Differential  Result Value Ref Range   WBC 21.3 (H) 4.0 - 10.5 K/uL   RBC 4.26 3.87 - 5.11 MIL/uL   Hemoglobin 12.7 12.0 - 15.0 g/dL   HCT 38.1 36.0 - 46.0 %   MCV 89.4 78.0 - 100.0 fL   MCH 29.8 26.0 - 34.0 pg   MCHC 33.3 30.0 - 36.0 g/dL   RDW 13.8 11.5 - 15.5 %   Platelets 352 150 - 400 K/uL   Neutrophils Relative % 84 (H) 43 - 77 %   Lymphocytes Relative 7 (L) 12 - 46 %   Monocytes Relative 8 3 -  12 %   Eosinophils Relative 1 0 - 5 %   Basophils Relative 0 0 - 1 %   Neutro Abs 17.9 (H) 1.7 - 7.7 K/uL   Lymphs  Abs 1.5 0.7 - 4.0 K/uL   Monocytes Absolute 1.7 (H) 0.1 - 1.0 K/uL   Eosinophils Absolute 0.2 0.0 - 0.7 K/uL   Basophils Absolute 0.0 0.0 - 0.1 K/uL   WBC Morphology VACUOLATED NEUTROPHILS   Comprehensive metabolic panel  Result Value Ref Range   Sodium 138 135 - 145 mmol/L   Potassium 5.0 3.5 - 5.1 mmol/L   Chloride 104 96 - 112 mmol/L   CO2 26 19 - 32 mmol/L   Glucose, Bld 133 (H) 70 - 99 mg/dL   BUN 28 (H) 6 - 23 mg/dL   Creatinine, Ser 0.87 0.50 - 1.10 mg/dL   Calcium 5.6 (LL) 8.4 - 10.5 mg/dL   Total Protein 6.4 6.0 - 8.3 g/dL   Albumin 3.3 (L) 3.5 - 5.2 g/dL   AST 24 0 - 37 U/L   ALT 21 0 - 35 U/L   Alkaline Phosphatase 94 39 - 117 U/L   Total Bilirubin 0.3 0.3 - 1.2 mg/dL   GFR calc non Af Amer 66 (L) >90 mL/min   GFR calc Af Amer 77 (L) >90 mL/min   Anion gap 8 5 - 15  Urinalysis, Routine w reflex microscopic  Result Value Ref Range   Color, Urine YELLOW YELLOW   APPearance CLEAR CLEAR   Specific Gravity, Urine 1.021 1.005 - 1.030   pH 5.0 5.0 - 8.0   Glucose, UA NEGATIVE NEGATIVE mg/dL   Hgb urine dipstick SMALL (A) NEGATIVE   Bilirubin Urine SMALL (A) NEGATIVE   Ketones, ur NEGATIVE NEGATIVE mg/dL   Protein, ur NEGATIVE NEGATIVE mg/dL   Urobilinogen, UA 0.2 0.0 - 1.0 mg/dL   Nitrite NEGATIVE NEGATIVE   Leukocytes, UA SMALL (A) NEGATIVE  Urine microscopic-add on  Result Value Ref Range   WBC, UA 7-10 <3 WBC/hpf   RBC / HPF 0-2 <3 RBC/hpf   Bacteria, UA FEW (A) RARE   *Note: Due to a large number of results and/or encounters for the requested time period, some results have not been displayed. A complete set of results can be found in Results Review.   Dg Ribs Unilateral W/chest Right  05/15/2014   CLINICAL DATA:  Acute right rib pain after falling at home. Initial encounter.  EXAM: RIGHT RIBS AND CHEST - 3+ VIEW  COMPARISON:  Jul 30, 2011.  FINDINGS: Minimally displaced fracture seen involving the anterior portion of the right tenth rib. There is no evidence of  pneumothorax or pleural effusion. Both lungs are clear. Left-sided pacemaker is unchanged in position. Heart size and mediastinal contours are within normal limits.  IMPRESSION: Minimally displaced right tenth rib fracture. No acute cardiopulmonary abnormality seen.   Electronically Signed   By: Marijo Conception, M.D.   On: 05/15/2014 15:37   Ct Abdomen Pelvis W Contrast  05/17/2014   CLINICAL DATA:  Generalized abdominal pain.  EXAM: CT ABDOMEN AND PELVIS WITH CONTRAST  TECHNIQUE: Multidetector CT imaging of the abdomen and pelvis was performed using the standard protocol following bolus administration of intravenous contrast.  CONTRAST:  18mL OMNIPAQUE IOHEXOL 300 MG/ML  SOLN  COMPARISON:  CT scan of December 11, 2011.  FINDINGS: Moderate degenerative disc disease is noted at L4-5 with grade 1 anterolisthesis secondary to posterior facet joint hypertrophy. Status post right total hip arthroplasty. Visualized  lung bases appear normal.  No gallstones are noted. Stable intrahepatic and extrahepatic biliary dilatation is noted without definite cause of obstruction. The spleen and pancreas appear normal. Adrenal glands and kidneys appear normal without hydronephrosis or renal obstruction. There remains severe proximal small bowel dilatation concerning for distal small bowel obstruction, although definite transition zone is not identified. Stool is noted in the sigmoid colon. Atherosclerotic calcifications of abdominal aorta are noted without aneurysm formation. No abnormal fluid collection is noted. Urinary bladder appears normal. Status post hysterectomy.  IMPRESSION: Stable intrahepatic and extrahepatic biliary dilatation is noted without definite cause of obstruction.  Severe proximal small bowel dilatation is noted without definite transition zone identified. This is concerning for partial distal small bowel obstruction.   Electronically Signed   By: Marijo Conception, M.D.   On: 05/17/2014 14:15   Dg Hand 2  View Left  05/15/2014   CLINICAL DATA:  Postreduction left middle finger.  EXAM: LEFT HAND - 2 VIEW  COMPARISON:  05/15/2014  FINDINGS: Interval relocation of the proximal interphalangeal joint of the left third finger. There is associated soft tissue swelling. No definite fracture is appreciated. Prominent degenerative changes throughout the interphalangeal joints and in the wrist. Collapse and compression of the trapezium, likely degenerative.  IMPRESSION: Interval relocation of the proximal interphalangeal joint of the left third finger with good alignment demonstrated. Soft tissue swelling is present. Diffuse degenerative changes.   Electronically Signed   By: Lucienne Capers M.D.   On: 05/15/2014 17:38   Dg Hand Complete Left  05/15/2014   CLINICAL DATA:  Fall  EXAM: LEFT HAND - COMPLETE 3+ VIEW  COMPARISON:  None.  FINDINGS: Posterior dislocation of the 3rd middle phalanx at the PIP joint.  Associated mild cortical irregularity on the oblique view suggests a possible associated fracture of the base of the 3rd middle phalanx.  Additional possible fracture involving the base of the 4th middle phalanx.  Associated soft tissue swelling of the 2nd and 3rd digit.  Degenerative changes of the 1st carpometacarpal joint.  IMPRESSION: Posterior dislocation of the 3rd middle phalanx at the PIP joint, with suspected associated fracture at the base of the middle phalanx.  Possible additional fracture involving the base of the 4th middle phalanx.   Electronically Signed   By: Julian Hy M.D.   On: 05/15/2014 13:55      EKG Interpretation None      MDM   Final diagnoses:  Abdominal pain  Abdominal pain    Patient with suspected small bowel obstruction. We'll treat pain, give nausea medicine and check CT scan.  Patient has a leukocytosis to 21. Still having abdominal pain, will give additional pain medicine.  CT remarkable for distal small bowel obstruction. Patient discussed with general  surgery who will consult. General surgery recommends medicine admission.  Patient discussed with Dr. Tawnya Crook, who agrees with the plan.    Montine Circle, PA-C 05/17/14 1529  Ernestina Patches, MD 05/18/14 [phone]

## 2014-05-17 NOTE — Consult Note (Signed)
Reason for Consult:  [facility] Pulmonary:  Dr. Halford Chessman Cardiac: Dr. Percival Spanish, and Dr. Caryl Comes Referring Physician: Lorre Munroe PA-C  [name] is an 69 y.o. female.   HPI: pt known to our service, she has these frequently.  She started having symptoms 7 days ago, 05/09/14, but had a BM by Thursday 05/13/14, and felt better.  She did well till yesterday and her symptoms returned. Over the weekend yesterday she started vomiting, no bowel movement since 05/13/14.  She has a history of multiple abdominal surgeries and her last [facility] hospitalization was in 2013.  At that time she opened up with bowel rest.   She was  here after a fall 2 days ago with R 10th rib fx, and 3rd middle phalanx dislocation and open fracture. Treated by Dr. Burney Gauze, who recommend ER reduction/splinting and antibiotics.  Work up in the ED here shows, she is afebrile, VSS.  CMP shows is OK, glucose up some, WBC is 21.3 K CT scan shows:  There remains severe proximal small bowel dilatation concerning for distal small bowel obstruction, although definite transition zone is not identified. Stool is noted in the sigmoid colon.  We are ask to see.   Past Medical History  Diagnosis Date  Recurrent high-grade small bowel obstruction 12/11/11-12/18/11 Multiple abdominal surgeries: S/p colectomy 2001, Small bowel resection 2003, Gastrocutaneous fistula closure, exploratory lap, lysis of adhesions, gastrostomy tube 2011, hx of hysterectomy, appendectomy    CAD/ AF/ Cardiomyopathy/ CHF/PTVP  Primary dilated cardiomyopathy   EF 45 to 50% per echo in Jan 2012     COPD O2 Dependant,    Chronic painsyndrome/ chronic abdominal pain/fibromyalgia on multiple pain meds at home/GASTROPARESIS   Venous embolism and thrombosis of subclavian vein   after pacemaker insertion Oct 2011     GERD (gastroesophageal reflux disease)   CAD (coronary artery disease)    mild disease per cath in 2011  Pleural effusion     s/p right thoracentesis 03/09       Urinary incontinence     -INTERSTIM IMPLANT NOT FUNCTIONING PER PT  RLS (restless legs syndrome)   Vitamin B12 deficiency   Gastroparesis   Chronic nausea   Chronic abdominal pain   Chronic pain syndrome   Pacemaker    CRT therapy; followed by Dr. Caryl Comes  H/O: GI bleed    from Pradaxa  Oxygen dependent    2 liters via nasal cannula at all times  CHF (congestive heart failure)   Anemia   Hx of stress fx aug 2011   right hip   Elbow fracture, left aug 2012  Arthritis    right hip  Hx of gastric ulcer   Diverticulitis   HTN (hypertension)    under control; has been on med. x "years"  Hx MRSA infection   Pneumonia - 03/09, 12/09, 02/10, 06/10   MOST RECENT FEB 2013  Elevated liver enzymes   Blood transfusion   Anginal pain   Shortness of breath   Hepatitis    hx of in high school   Silent aspiration     Past Surgical History  Procedure Laterality Date  . Appendectomy    . Pacemaker placement  12/21/2009  . Peg removed      with complications  . Tonsillectomy  age 70  . Interstim implant revision  03/06/2011    Procedure: REVISION OF [name];  Surgeon: Reece Packer, MD;  Location: [facility];  Service: Urology;  Laterality: N/A;  Replacement of Neurostimulator  . Portacath  placement  12/16/2009  . Total abdominal hysterectomy      complete  . Interstim implant placement  05/28/2006 - stage I    06/05/2006 - stage II  . Interstim implant revision  10/23/2007  . Cystoscopy with injection  04/30/2006    transurethral collagen injection; incision vaginal stenosis  . Small intestine surgery  05/20/2001    ex. lap., resection of small bowel stricture; gastrostomy; insertion central line  . Cardiac catheterization  04/08/2006, 11/16/2009  . Cystoscopy  11/11/2008  . Cystoscopy w/ retrogrades  11/11/2008    right  . Colectomy      Intestinal resection (5times)  . Colectomy  02/04/2000    ex. lap., intra-abd. subtotal colectomy with ileosigmoid colon anastomosies and lysis of  adhesions  . Gastrocutaneous fistula closure    . Exploratory laparotomy  04/27/2009    lysis of adhesions, gastrostomy tube  . Port-a-cath removal  07/27/2011    Procedure: REMOVAL PORT-A-CATH;  Surgeon: Rolm Bookbinder, MD;  Location: [place];  Service: General;  Laterality: N/A;  . Total hip arthroplasty  08/03/2011    Procedure: TOTAL HIP ARTHROPLASTY ANTERIOR APPROACH;  Surgeon: Mcarthur Rossetti, MD;  Location: [facility];  Service: Orthopedics;  Laterality: Right;  . Interstim implant placement  02/05/2012    Procedure: INTERSTIM IMPLANT FIRST STAGE;  Surgeon: Reece Packer, MD;  Location: [facility];  Service: Urology;  Laterality: N/A;  Replacement of Interstim Lead     . Esophagogastroduodenoscopy N/A 02/02/2013    Procedure: ESOPHAGOGASTRODUODENOSCOPY (EGD);  Surgeon: Lafayette Dragon, MD;  Location: [facility];  Service: Endoscopy;  Laterality: N/A;  . Savory dilation N/A 02/02/2013    Procedure: SAVORY DILATION;  Surgeon: Lafayette Dragon, MD;  Location: [facility];  Service: Endoscopy;  Laterality: N/A;    Family History  Problem Relation Age of Onset  . Heart disease Father   . Stroke Mother   . Heart disease Mother   . Colon cancer Neg Hx   . Heart disease Brother   . Breast cancer Maternal Aunt   . Heart disease Paternal Aunt   . Diabetes Paternal Grandfather     Social History:  reports that she quit smoking about 28 years ago. She has never used smokeless tobacco. She reports that she does not drink alcohol or use illicit drugs.  Allergies:  Allergies  Allergen Reactions  . Dabigatran Etexilate Mesylate Other (See Comments)    INTERNAL BLEEDING-pradaxa  . Talwin [Pentazocine] Other (See Comments)    hallucinations  . Pentazocine Lactate Other (See Comments)    HALLUCINATIONS  . Sulfonamide Derivatives Other (See Comments)    JITTERINESS     Prior to Admission medications   Medication Sig Start Date End Date Taking? Authorizing  Provider  albuterol (PROAIR HFA) 108 (90 BASE) MCG/ACT inhaler Inhale 2 puffs into the lungs every 6 (six) hours as needed for wheezing or shortness of breath. 02/08/14  Yes Chesley Mires, MD  AMBULATORY NON FORMULARY MEDICATION Pt takes Oxygen, 2 units, every day   Yes Historical Provider, MD  carvedilol (COREG) 6.25 MG tablet TAKE ONE TABLET BY MOUTH TWICE DAILY WITH MEALS   Yes Minus Breeding, MD  furosemide (LASIX) 40 MG tablet TAKE ONE TABLET BY MOUTH IN THE MORNING 01/04/14  Yes Minus Breeding, MD  gabapentin (NEURONTIN) 600 MG tablet TAKE ONE TABLET BY MOUTH 4 TIMES DAILY 04/29/14  Yes Rosalita Chessman, DO  loperamide (IMODIUM A-D) 2 MG tablet Take 1/2 tablet by mouth daily  as needed for diarrhea. 07/18/12  Yes Lafayette Dragon, MD  MYRBETRIQ 25 MG TB24 tablet Take 1 tablet by mouth daily. 12/30/12  Yes Historical Provider, MD  nitroGLYCERIN (NITROSTAT) 0.4 MG SL tablet Place 0.4 mg under the tongue every 5 (five) minutes as needed. CHEST PAIN 03/14/11  Yes Minus Breeding, MD  oxyCODONE-acetaminophen (PERCOCET) 10-325 MG per tablet Take 1 tablet by mouth every 4 (four) hours as needed. PAIN 04/20/14  Yes Yvonne R Lowne, DO  pantoprazole (PROTONIX) 40 MG tablet TAKE ONE TABLET BY MOUTH TWICE DAILY 04/05/14  Yes Lafayette Dragon, MD  promethazine (PHENERGAN) 25 MG tablet Take 1 tablet (25 mg total) by mouth every 8 (eight) hours as needed for nausea. 12/18/13  Yes Lafayette Dragon, MD  tiZANidine (ZANAFLEX) 4 MG tablet Take 1 tablet (4 mg total) by mouth 3 (three) times daily as needed for muscle spasms. 04/21/14  Yes Rosalita Chessman, DO  zolpidem (AMBIEN) 5 MG tablet TAKE ONE TABLET BY MOUTH AT BEDTIME AS NEEDED FOR SLEEP 04/19/14  Yes Yvonne R Lowne, DO  cephALEXin (KEFLEX) 500 MG capsule Take 1 capsule (500 mg total) by mouth 3 (three) times daily. Patient not taking: Reported on 05/17/2014 05/15/14   Wandra Arthurs, MD  HYDROcodone-acetaminophen (NORCO/VICODIN) 5-325 MG per tablet Take 1-2 tablets by mouth every 4 (four)  hours as needed. Patient not taking: Reported on 05/17/2014 05/15/14   Wandra Arthurs, MD  tiotropium (SPIRIVA HANDIHALER) 18 MCG inhalation capsule Place 1 capsule (18 mcg total) into inhaler and inhale every morning. Patient taking differently: Place 18 mcg into inhaler and inhale daily as needed (shortness of breath).  07/24/11   Chesley Mires, MD     Results for orders placed or performed during the hospital encounter of 05/17/14 (from the past 48 hour(s))  CBC with Differential     Status: Abnormal   Collection Time: 05/17/14 11:36 AM  Result Value Ref Range   WBC 21.3 (H) 4.0 - 10.5 K/uL   RBC 4.26 3.87 - 5.11 MIL/uL   Hemoglobin 12.7 12.0 - 15.0 g/dL   HCT 38.1 36.0 - 46.0 %   MCV 89.4 78.0 - 100.0 fL   MCH 29.8 26.0 - 34.0 pg   MCHC 33.3 30.0 - 36.0 g/dL   RDW 13.8 11.5 - 15.5 %   Platelets 352 150 - 400 K/uL   Neutrophils Relative % 84 (H) 43 - 77 %   Lymphocytes Relative 7 (L) 12 - 46 %   Monocytes Relative 8 3 - 12 %   Eosinophils Relative 1 0 - 5 %   Basophils Relative 0 0 - 1 %   Neutro Abs 17.9 (H) 1.7 - 7.7 K/uL   Lymphs Abs 1.5 0.7 - 4.0 K/uL   Monocytes Absolute 1.7 (H) 0.1 - 1.0 K/uL   Eosinophils Absolute 0.2 0.0 - 0.7 K/uL   Basophils Absolute 0.0 0.0 - 0.1 K/uL   WBC Morphology VACUOLATED NEUTROPHILS   Comprehensive metabolic panel     Status: Abnormal   Collection Time: 05/17/14 11:36 AM  Result Value Ref Range   Sodium 138 135 - 145 mmol/L   Potassium 5.0 3.5 - 5.1 mmol/L   Chloride 104 96 - 112 mmol/L   CO2 26 19 - 32 mmol/L   Glucose, Bld 133 (H) 70 - 99 mg/dL   BUN 28 (H) 6 - 23 mg/dL   Creatinine, Ser 0.87 0.50 - 1.10 mg/dL   Calcium 5.6 (LL) 8.4 - 10.5 mg/dL  Comment: REPEATED TO VERIFY CRITICAL RESULT CALLED TO, READ BACK BY AND VERIFIED WITH: M.HAMBY RN AT 1232 ON 2.29.16 BY W.BUCHHOLZ.    Total Protein 6.4 6.0 - 8.3 g/dL   Albumin 3.3 (L) 3.5 - 5.2 g/dL   AST 24 0 - 37 U/L   ALT 21 0 - 35 U/L   Alkaline Phosphatase 94 39 - 117 U/L   Total  Bilirubin 0.3 0.3 - 1.2 mg/dL   GFR calc non Af Amer 66 (L) >90 mL/min   GFR calc Af Amer 77 (L) >90 mL/min    Comment: (NOTE) The eGFR has been calculated using the CKD EPI equation. This calculation has not been validated in all clinical situations. eGFR's persistently <90 mL/min signify possible Chronic Kidney Disease.    Anion gap 8 5 - 15  Urinalysis, Routine w reflex microscopic     Status: Abnormal   Collection Time: 05/17/14 12:17 PM  Result Value Ref Range   Color, Urine YELLOW YELLOW   APPearance CLEAR CLEAR   Specific Gravity, Urine 1.021 1.005 - 1.030   pH 5.0 5.0 - 8.0   Glucose, UA NEGATIVE NEGATIVE mg/dL   Hgb urine dipstick SMALL (A) NEGATIVE   Bilirubin Urine SMALL (A) NEGATIVE   Ketones, ur NEGATIVE NEGATIVE mg/dL   Protein, ur NEGATIVE NEGATIVE mg/dL   Urobilinogen, UA 0.2 0.0 - 1.0 mg/dL   Nitrite NEGATIVE NEGATIVE   Leukocytes, UA SMALL (A) NEGATIVE  Urine microscopic-add on     Status: Abnormal   Collection Time: 05/17/14 12:17 PM  Result Value Ref Range   WBC, UA 7-10 <3 WBC/hpf   RBC / HPF 0-2 <3 RBC/hpf   Bacteria, UA FEW (A) RARE   *Note: Due to a large number of results and/or encounters for the requested time period, some results have not been displayed. A complete set of results can be found in Results Review.    Dg Ribs Unilateral W/chest Right  05/15/2014   CLINICAL DATA:  Acute right rib pain after falling at home. Initial encounter.  EXAM: RIGHT RIBS AND CHEST - 3+ VIEW  COMPARISON:  Jul 30, 2011.  FINDINGS: Minimally displaced fracture seen involving the anterior portion of the right tenth rib. There is no evidence of pneumothorax or pleural effusion. Both lungs are clear. Left-sided pacemaker is unchanged in position. Heart size and mediastinal contours are within normal limits.  IMPRESSION: Minimally displaced right tenth rib fracture. No acute cardiopulmonary abnormality seen.   Electronically Signed   By: Marijo Conception, M.D.   On: 05/15/2014  15:37   Ct Abdomen Pelvis W Contrast  05/17/2014   CLINICAL DATA:  Generalized abdominal pain.  EXAM: CT ABDOMEN AND PELVIS WITH CONTRAST  TECHNIQUE: Multidetector CT imaging of the abdomen and pelvis was performed using the standard protocol following bolus administration of intravenous contrast.  CONTRAST:  70m OMNIPAQUE IOHEXOL 300 MG/ML  SOLN  COMPARISON:  CT scan of December 11, 2011.  FINDINGS: Moderate degenerative disc disease is noted at L4-5 with grade 1 anterolisthesis secondary to posterior facet joint hypertrophy. Status post right total hip arthroplasty. Visualized lung bases appear normal.  No gallstones are noted. Stable intrahepatic and extrahepatic biliary dilatation is noted without definite cause of obstruction. The spleen and pancreas appear normal. Adrenal glands and kidneys appear normal without hydronephrosis or renal obstruction. There remains severe proximal small bowel dilatation concerning for distal small bowel obstruction, although definite transition zone is not identified. Stool is noted in the sigmoid colon. Atherosclerotic  calcifications of abdominal aorta are noted without aneurysm formation. No abnormal fluid collection is noted. Urinary bladder appears normal. Status post hysterectomy.  IMPRESSION: Stable intrahepatic and extrahepatic biliary dilatation is noted without definite cause of obstruction.  Severe proximal small bowel dilatation is noted without definite transition zone identified. This is concerning for partial distal small bowel obstruction.   Electronically Signed   By: Marijo Conception, M.D.   On: 05/17/2014 14:15   Dg Hand 2 View Left  05/15/2014   CLINICAL DATA:  Postreduction left middle finger.  EXAM: LEFT HAND - 2 VIEW  COMPARISON:  05/15/2014  FINDINGS: Interval relocation of the proximal interphalangeal joint of the left third finger. There is associated soft tissue swelling. No definite fracture is appreciated. Prominent degenerative changes  throughout the interphalangeal joints and in the wrist. Collapse and compression of the trapezium, likely degenerative.  IMPRESSION: Interval relocation of the proximal interphalangeal joint of the left third finger with good alignment demonstrated. Soft tissue swelling is present. Diffuse degenerative changes.   Electronically Signed   By: Lucienne Capers M.D.   On: 05/15/2014 17:38    Review of Systems  Constitutional: Negative.   HENT: Negative.   Eyes: Negative.   Respiratory: Positive for shortness of breath. Negative for hemoptysis, sputum production and wheezing.        On O2 24/7  Cardiovascular: Negative.  Negative for chest pain.  Gastrointestinal: Positive for heartburn, nausea, vomiting, abdominal pain and diarrhea (chronic loose stools). Negative for constipation, blood in stool and melena.  Genitourinary: Negative.   Musculoskeletal:       She gets cramps both legs and arms.  Skin: Negative.   Neurological: Negative.   Endo/Heme/Allergies: Bruises/bleeds easily.  Psychiatric/Behavioral: Negative.    Blood pressure 111/65, pulse 91, temperature 98 F (36.7 C), temperature source Oral, resp. rate 16, SpO2 100 %. Physical Exam  Constitutional: She is oriented to person, place, and time.  Thin cachectic woman, no acute distress.  HENT:  Head: Normocephalic and atraumatic.  Nose: Nose normal.  Eyes: Conjunctivae and EOM are normal. Pupils are equal, round, and reactive to light. Right eye exhibits no discharge. Left eye exhibits no discharge. No scleral icterus.  Neck: Normal range of motion. Neck supple. No JVD present. No tracheal deviation present. No thyromegaly present.  Cardiovascular: Normal rate, regular rhythm, normal heart sounds and intact distal pulses.   No murmur heard. Respiratory: Effort normal and breath sounds normal. No respiratory distress. She has no wheezes. She has no rales. She exhibits no tenderness.  GI: Soft. She exhibits distension (marked  abdominal distension, ). She exhibits no mass. There is tenderness. There is no rebound and no guarding.    Musculoskeletal: She exhibits no edema or tenderness.  Lymphadenopathy:    She has no cervical adenopathy.  Neurological: She is alert and oriented to person, place, and time. No cranial nerve deficit.  Skin: Skin is warm and dry. No rash noted. No erythema. No pallor.  Psychiatric: She has a normal mood and affect. Her behavior is normal. Judgment and thought content normal.    Assessment/Plan: 1.  Recurrent SBO, S/p multiple abdominal:S/p colectomy 2001, Small bowel resection 2003, Gastrocutaneous fistula closure, exploratory lap, lysis of adhesions, gastrostomy tube 2011, hx of hysterectomy, appendectomy  . 2.  COPD, home O2 dependant 24/7. 3.  CAD/AF/PTVP/Hx of CHF/Cardiomyopathy 4.  GERD 5.  Chronic pain/fibromyalgia 6. Gastroparesis/hx of GI bleed on Pradaxa 7.  Hypertension 8.  Hx of diverticulitis 9.  Remote  hx of hepatitis    Plan:  Being admitted by Medicine, bowel rest, hydration and NG decompression.  Recheck films in AM.  Vinita Prentiss 05/17/2014, 2:39 PM

## 2014-05-17 NOTE — Telephone Encounter (Signed)
Spoke with patient's husband and he states she has been having problems for over a week with obstruction. She had her last bowel movement on Thursday. Over the weekend, she has had abdominal pain and vomiting. She is voiding very little. Patient's husband thinks she is dehydrated and may need admission. He will take her to [facility] ED.

## 2014-05-17 NOTE — Progress Notes (Signed)
Utilization Review completed.  Arraya Buck RN CM  

## 2014-05-17 NOTE — ED Notes (Signed)
Bed: WA19 Expected date:  Expected time:  Means of arrival:  Comments: 

## 2014-05-18 ENCOUNTER — Inpatient Hospital Stay (HOSPITAL_COMMUNITY): Payer: PPO

## 2014-05-18 DIAGNOSIS — I509 Heart failure, unspecified: Secondary | ICD-10-CM

## 2014-05-18 LAB — CBC
HCT: 36 % (ref 36.0–46.0)
Hemoglobin: 11.4 g/dL — ABNORMAL LOW (ref 12.0–15.0)
MCH: 28.9 pg (ref 26.0–34.0)
MCHC: 31.7 g/dL (ref 30.0–36.0)
MCV: 91.4 fL (ref 78.0–100.0)
Platelets: 360 10*3/uL (ref 150–400)
RBC: 3.94 MIL/uL (ref 3.87–5.11)
RDW: 13.8 % (ref 11.5–15.5)
WBC: 7.8 10*3/uL (ref 4.0–10.5)

## 2014-05-18 LAB — BASIC METABOLIC PANEL
Anion gap: 6 (ref 5–15)
BUN: 24 mg/dL — ABNORMAL HIGH (ref 6–23)
CO2: 28 mmol/L (ref 19–32)
Calcium: 5.5 mg/dL — CL (ref 8.4–10.5)
Chloride: 103 mmol/L (ref 96–112)
Creatinine, Ser: 0.63 mg/dL (ref 0.50–1.10)
GFR calc Af Amer: >90 mL/min (ref >90)
GFR calc non Af Amer: 89 mL/min — ABNORMAL LOW (ref >90)
Glucose, Bld: 98 mg/dL (ref 70–99)
Potassium: 5.3 mmol/L — ABNORMAL HIGH (ref 3.5–5.1)
Sodium: 137 mmol/L (ref 135–145)

## 2014-05-18 LAB — URINE CULTURE
Colony Count: NO GROWTH
Culture: NO GROWTH

## 2014-05-18 MED ORDER — CALCIUM GLUCONATE 10 % IV SOLN
1.0000 g | Freq: Once | INTRAVENOUS | Status: AC
  Administered 2014-05-18: 1 g via INTRAVENOUS
  Filled 2014-05-18: qty 10

## 2014-05-18 NOTE — Progress Notes (Signed)
CRITICAL VALUE ALERT  Critical value received:  Calcium 5.5  Date of notification:  05/18/2014   Time of notification:  0700  Critical value read back:Yes.    Nurse who received alert:  Edwyna Ready  MD notified (1st page):  Mikhail  Time of first page:  [phone]

## 2014-05-18 NOTE — Consult Note (Signed)
WOC wound consult note Reason for Consult: blood filled blister and partial thickness wound on 3rd finger or right hand Wound type:traumatic Pressure Ulcer POA: No Measurement: anterior: 0.1cm x 0.6cm x 0.1cm partial thickness wound.  Posterior:  0.2cm round blood-filled blister. Wound BOF:BPZW Drainage (amount, consistency, odor) none Periwound:intact, bruised Dressing procedure/placement/frequency: I suggest application of betadine ointment to these areas once daily for three days and discontinue. [place] nursing team will not follow, but will remain available to this patient, the nursing and medical team.  Please re-consult if needed. Thanks, Maudie Flakes, MSN, RN, [place], [place], [place] [phone])

## 2014-05-18 NOTE — Progress Notes (Signed)
[facility]                                                                              Patient Demographics  [name], is a 69 y.o. female, DOB - [birth year], NOM:[id number]  Admit date - 05/17/2014   Admitting Physician Cristal Ford, DO  Outpatient Primary MD for the patient is Garnet Koyanagi, DO  LOS - 1   Chief Complaint  Patient presents with  . Abdominal Pain  . Emesis      Interim history 69 year old female with history of small bowel obstruction in the past secondary to multiple abdominal surgeries presented with abdominal pain nausea and vomiting. Patient's last bowel movement was this past Thursday, 05/13/2014. She was admitted for small bowel obstruction. General surgery's been consulted. Continue conservative management for now.  Assessment & Plan   Abdominal pain/Nausea/VomSmall Bowel Obstruction -Patient will be admitted to the medical floor. -CT of the abdomen: Stable intra-and extrahepatic biliary dilatation, without definite cause of obstruction. Severe proximal small bowel dilatation without definite transition zone -Likely secondary to previous surgeries with adhesions -Gen. surgery consult appreciated -Continue conservative management, bowel rest, NG tube, antiemetics, pain control  Essential hypertension -Currently stable, will hold Lasix and Coreg at this time -Continue metoprolol 2.5 mg IV every 6 hours  History of coronary artery disease -Currently stable, patient denies any chest pain at this time -Continue metoprolol 2.5 mg IV every 6 hours -Of note, patient states that she cannot take aspirin or any type of blood thinners due to history of bleeding  Leukocytosis -Likely reactive, appears to have resolved -Was 21.3 upon admission, today 7.8 -Patient currently afebrile, with no complaints of urinary symptoms, cough  Asymptomatic bacteriuria -UA: WBC 7-10, negative nitrites, small leukocytes -Patient denies any dysuria or other  urinary symptoms  History of systolic and diastolic heart failure -Echocardiogram in January 2012 showed a grade 1 diastolic dysfunction with an EF of 45-50% -Patient currently appears to be euvolemic -Continue to monitor daily weights, intake and output -Echocardiogram pending -Lasix will be held for the time being  History of COPD, oxygen dependent -Currently euvolemic and stable -Continue nasal cannula, 2 L -Patient has no wheezing or complaints of shortness of breath -Continue albuterol as well as Spiriva  Hypocalcemia -Calcium 5.6, correct 6.16 -Patient was given IV calcium gluconate however her calcium failed to increase. We'll give another dose. -May possible contribute to SBO?  Recent fall -Patient presented to emergency department on 05/15/2014 after sustaining a fall, x-ray showed a right 10th rib fracture -Patient also experienced a third middle phalanx dislocation with open fracture -Dr. Burney Gauze recommended ER reduction with washout -right hand appears to be stable, currently has finger splint -Wound care consulted.  Code Status: Full  Family Communication: None at bedside.  Disposition Plan: Admitted, continue conservative management for SBO  Time Spent in minutes   30 minutes  Procedures  Echocardiogram  Consults   Gen. surgery  DVT Prophylaxis  SCDs  Lab Results  Component Value Date   PLT 360 05/18/2014    Medications  Scheduled Meds: . calcium gluconate  1 g Intravenous Once  . metoprolol  2.5 mg Intravenous 4 times per day  .  tiotropium  18 mcg Inhalation Daily   Continuous Infusions: . sodium chloride 75 mL/hr at 05/17/14 1712   PRN Meds:.acetaminophen **OR** acetaminophen, albuterol, HYDROmorphone (DILAUDID) injection, ondansetron **OR** ondansetron (ZOFRAN) IV  Antibiotics    Anti-infectives    None      Subjective:   [name] seen and examined today.  Patient states she continues to have pain. Denies any bowel movements  or gas passage. Patient states she feels her abdomen has slightly decreased in size since having the NG tube placed. Denies any chest pain. States her breathing is at baseline.  Objective:   Filed Vitals:   05/18/14 0500 05/18/14 0537 05/18/14 0904 05/18/14 1024  BP:  124/76  113/72  Pulse:  78  88  Temp:  98 F (36.7 C)    TempSrc:  Oral    Resp:  18  16  Height:      Weight: 45.3 kg (99 lb 13.9 oz)     SpO2:  97% 98% 99%    Wt Readings from Last 3 Encounters:  05/18/14 45.3 kg (99 lb 13.9 oz)  04/29/14 47.083 kg (103 lb 12.8 oz)  03/23/14 45.587 kg (100 lb 8 oz)     Intake/Output Summary (Last 24 hours) at 05/18/14 1044 Last data filed at 05/18/14 0545  Gross per 24 hour  Intake      0 ml  Output    975 ml  Net   -975 ml    Exam  General: Well developed, thin, appears stated age  38: NCAT, mucous membranes moist. Nasal canula and NGtube  in place  Cardiovascular: S1 S2 auscultated, RRR  Respiratory: Clear to auscultation bilaterally with equal chest rise  Abdomen: Soft, mildly diffuse tenderness, mildly distended, + diminshed bowel sounds  Extremities: warm dry without cyanosis clubbing or edema, Right Digit in Finger Splint  Neuro: AAOx3, nonfocal  Psych: Appropriate mood and affect, pleasant  Data Review   Micro Results No results found for this or any previous visit (from the past 240 hour(s)).  Radiology Reports Dg Ribs Unilateral W/chest Right  05/15/2014   CLINICAL DATA:  Acute right rib pain after falling at home. Initial encounter.  EXAM: RIGHT RIBS AND CHEST - 3+ VIEW  COMPARISON:  Jul 30, 2011.  FINDINGS: Minimally displaced fracture seen involving the anterior portion of the right tenth rib. There is no evidence of pneumothorax or pleural effusion. Both lungs are clear. Left-sided pacemaker is unchanged in position. Heart size and mediastinal contours are within normal limits.  IMPRESSION: Minimally displaced right tenth rib fracture. No acute  cardiopulmonary abnormality seen.   Electronically Signed   By: Marijo Conception, M.D.   On: 05/15/2014 15:37   Ct Abdomen Pelvis W Contrast  05/17/2014   CLINICAL DATA:  Generalized abdominal pain.  EXAM: CT ABDOMEN AND PELVIS WITH CONTRAST  TECHNIQUE: Multidetector CT imaging of the abdomen and pelvis was performed using the standard protocol following bolus administration of intravenous contrast.  CONTRAST:  59mL OMNIPAQUE IOHEXOL 300 MG/ML  SOLN  COMPARISON:  CT scan of December 11, 2011.  FINDINGS: Moderate degenerative disc disease is noted at L4-5 with grade 1 anterolisthesis secondary to posterior facet joint hypertrophy. Status post right total hip arthroplasty. Visualized lung bases appear normal.  No gallstones are noted. Stable intrahepatic and extrahepatic biliary dilatation is noted without definite cause of obstruction. The spleen and pancreas appear normal. Adrenal glands and kidneys appear normal without hydronephrosis or renal obstruction. There remains severe proximal small  bowel dilatation concerning for distal small bowel obstruction, although definite transition zone is not identified. Stool is noted in the sigmoid colon. Atherosclerotic calcifications of abdominal aorta are noted without aneurysm formation. No abnormal fluid collection is noted. Urinary bladder appears normal. Status post hysterectomy.  IMPRESSION: Stable intrahepatic and extrahepatic biliary dilatation is noted without definite cause of obstruction.  Severe proximal small bowel dilatation is noted without definite transition zone identified. This is concerning for partial distal small bowel obstruction.   Electronically Signed   By: Marijo Conception, M.D.   On: 05/17/2014 14:15   Dg Hand 2 View Left  05/15/2014   CLINICAL DATA:  Postreduction left middle finger.  EXAM: LEFT HAND - 2 VIEW  COMPARISON:  05/15/2014  FINDINGS: Interval relocation of the proximal interphalangeal joint of the left third finger. There is  associated soft tissue swelling. No definite fracture is appreciated. Prominent degenerative changes throughout the interphalangeal joints and in the wrist. Collapse and compression of the trapezium, likely degenerative.  IMPRESSION: Interval relocation of the proximal interphalangeal joint of the left third finger with good alignment demonstrated. Soft tissue swelling is present. Diffuse degenerative changes.   Electronically Signed   By: Lucienne Capers M.D.   On: 05/15/2014 17:38   Dg Abd Acute W/chest  05/17/2014   CLINICAL DATA:  Abdominal pain.  EXAM: ACUTE ABDOMEN SERIES (ABDOMEN 2 VIEW & CHEST 1 VIEW)  COMPARISON:  CT 05/17/2014.  Chest x-ray 05/15/2014, 07/30/2011.  FINDINGS: NG tube noted with tip in the stomach. Cardiac pacer with lead tips in right atrium and right ventricle. Chronic interstitial changes noted right upper lung.  Dilated proximal small bowel loops are noted. These findings are again consistent with small-bowel obstruction. Contrast is noted kidney from prior CT. No free air. No pneumatosis. Right hip replacement. Neurostimulator.  IMPRESSION: Persistent changes of proximal small bowel obstruction. NG tube is noted with its tip projected over the stomach.   Electronically Signed   By: Marcello Moores  Register   On: 05/17/2014 17:08   Dg Hand Complete Left  05/15/2014   CLINICAL DATA:  Fall  EXAM: LEFT HAND - COMPLETE 3+ VIEW  COMPARISON:  None.  FINDINGS: Posterior dislocation of the 3rd middle phalanx at the PIP joint.  Associated mild cortical irregularity on the oblique view suggests a possible associated fracture of the base of the 3rd middle phalanx.  Additional possible fracture involving the base of the 4th middle phalanx.  Associated soft tissue swelling of the 2nd and 3rd digit.  Degenerative changes of the 1st carpometacarpal joint.  IMPRESSION: Posterior dislocation of the 3rd middle phalanx at the PIP joint, with suspected associated fracture at the base of the middle phalanx.   Possible additional fracture involving the base of the 4th middle phalanx.   Electronically Signed   By: Julian Hy M.D.   On: 05/15/2014 13:55    CBC  Recent Labs Lab 05/15/14 1601 05/17/14 1136 05/18/14 0525  WBC 9.5 21.3* 7.8  HGB 12.7 12.7 11.4*  HCT 39.7 38.1 36.0  PLT 290 352 360  MCV 90.2 89.4 91.4  MCH 28.9 29.8 28.9  MCHC 32.0 33.3 31.7  RDW 13.7 13.8 13.8  LYMPHSABS 0.9 1.5  --   MONOABS 1.0 1.7*  --   EOSABS 0.5 0.2  --   BASOSABS 0.0 0.0  --     Chemistries   Recent Labs Lab 05/17/14 1136 05/18/14 0525  NA 138 137  K 5.0 5.3*  CL 104 103  CO2 26 28  GLUCOSE 133* 98  BUN 28* 24*  CREATININE 0.87 0.63  CALCIUM 5.6* 5.5*  MG 2.0  --   AST 24  --   ALT 21  --   ALKPHOS 94  --   BILITOT 0.3  --    ------------------------------------------------------------------------------------------------------------------ estimated creatinine clearance is 47.5 mL/min (by C-G formula based on Cr of 0.63). ------------------------------------------------------------------------------------------------------------------ No results for input(s): HGBA1C in the last 72 hours. ------------------------------------------------------------------------------------------------------------------ No results for input(s): CHOL, HDL, LDLCALC, TRIG, CHOLHDL, LDLDIRECT in the last 72 hours. ------------------------------------------------------------------------------------------------------------------ No results for input(s): TSH, T4TOTAL, T3FREE, THYROIDAB in the last 72 hours.  Invalid input(s): FREET3 ------------------------------------------------------------------------------------------------------------------ No results for input(s): VITAMINB12, FOLATE, FERRITIN, TIBC, IRON, RETICCTPCT in the last 72 hours.  Coagulation profile No results for input(s): INR, PROTIME in the last 168 hours.  No results for input(s): DDIMER in the last 72 hours.  Cardiac  Enzymes No results for input(s): CKMB, TROPONINI, MYOGLOBIN in the last 168 hours.  Invalid input(s): CK ------------------------------------------------------------------------------------------------------------------ Invalid input(s): POCBNP    Chrissa Meetze D.O. on 05/18/2014 at 10:44 AM  Between 7am to 7pm - Pager - [phone]  After 7pm go to [email or web address] - password [email or web address]  And look for the night coverage person covering for me after hours  [facility] Office  [phone]

## 2014-05-18 NOTE — Progress Notes (Signed)
[facility] Progress Note     Subjective: Pt's pain is a bit improved as well as distension since NG placed, but says it's still present.  Urinating well, but no flatus or BM yet.  Ambulating to bedside commode.  Home O2 dependent currently on .    Objective: Vital signs in last 24 hours: Temp:  [97.8 F (36.6 C)-98.4 F (36.9 C)] 98 F (36.7 C) (03/01 0537) Pulse Rate:  [78-108] 78 (03/01 0537) Resp:  [16-20] 18 (03/01 0537) BP: (111-141)/(58-76) 124/76 mmHg (03/01 0537) SpO2:  [92 %-100 %] 98 % (03/01 0904) Weight:  [99 lb 13.9 oz (45.3 kg)] 99 lb 13.9 oz (45.3 kg) (03/01 0500) Last BM Date: 05/13/14  Intake/Output from previous day: 02/29 0701 - 03/01 0700 In: -  Out: 975 [Emesis/NG output:975] Intake/Output this shift:    PE: Gen:  Alert, NAD, pleasant Abd: Soft, mild tenderness and mild/mod distension, diminished BS, no HSM, abdominal scars noted   Lab Results:   Recent Labs  05/17/14 1136 05/18/14 0525  WBC 21.3* 7.8  HGB 12.7 11.4*  HCT 38.1 36.0  PLT 352 360   BMET  Recent Labs  05/17/14 1136 05/18/14 0525  NA 138 137  K 5.0 5.3*  CL 104 103  CO2 26 28  GLUCOSE 133* 98  BUN 28* 24*  CREATININE 0.87 0.63  CALCIUM 5.6* 5.5*   PT/INR No results for input(s): LABPROT, INR in the last 72 hours. CMP     Component Value Date/Time   NA 137 05/18/2014 0525   K 5.3* 05/18/2014 0525   CL 103 05/18/2014 0525   CO2 28 05/18/2014 0525   GLUCOSE 98 05/18/2014 0525   GLUCOSE 104* 03/11/2006 1059   BUN 24* 05/18/2014 0525   CREATININE 0.63 05/18/2014 0525   CREATININE 0.74 01/09/2013 1453   CALCIUM 5.5* 05/18/2014 0525   PROT 6.4 05/17/2014 1136   ALBUMIN 3.3* 05/17/2014 1136   AST 24 05/17/2014 1136   ALT 21 05/17/2014 1136   ALKPHOS 94 05/17/2014 1136   BILITOT 0.3 05/17/2014 1136   GFRNONAA 89* 05/18/2014 0525   GFRAA >90 05/18/2014 0525   Lipase     Component Value Date/Time   LIPASE 8* 12/11/2011 1455        Studies/Results: Ct Abdomen Pelvis W Contrast  05/17/2014   CLINICAL DATA:  Generalized abdominal pain.  EXAM: CT ABDOMEN AND PELVIS WITH CONTRAST  TECHNIQUE: Multidetector CT imaging of the abdomen and pelvis was performed using the standard protocol following bolus administration of intravenous contrast.  CONTRAST:  25mL OMNIPAQUE IOHEXOL 300 MG/ML  SOLN  COMPARISON:  CT scan of December 11, 2011.  FINDINGS: Moderate degenerative disc disease is noted at L4-5 with grade 1 anterolisthesis secondary to posterior facet joint hypertrophy. Status post right total hip arthroplasty. Visualized lung bases appear normal.  No gallstones are noted. Stable intrahepatic and extrahepatic biliary dilatation is noted without definite cause of obstruction. The spleen and pancreas appear normal. Adrenal glands and kidneys appear normal without hydronephrosis or renal obstruction. There remains severe proximal small bowel dilatation concerning for distal small bowel obstruction, although definite transition zone is not identified. Stool is noted in the sigmoid colon. Atherosclerotic calcifications of abdominal aorta are noted without aneurysm formation. No abnormal fluid collection is noted. Urinary bladder appears normal. Status post hysterectomy.  IMPRESSION: Stable intrahepatic and extrahepatic biliary dilatation is noted without definite cause of obstruction.  Severe proximal small bowel dilatation is noted without definite transition zone identified. This  is concerning for partial distal small bowel obstruction.   Electronically Signed   By: Marijo Conception, M.D.   On: 05/17/2014 14:15   Dg Abd Acute W/chest  05/17/2014   CLINICAL DATA:  Abdominal pain.  EXAM: ACUTE ABDOMEN SERIES (ABDOMEN 2 VIEW & CHEST 1 VIEW)  COMPARISON:  CT 05/17/2014.  Chest x-ray 05/15/2014, 07/30/2011.  FINDINGS: NG tube noted with tip in the stomach. Cardiac pacer with lead tips in right atrium and right ventricle. Chronic  interstitial changes noted right upper lung.  Dilated proximal small bowel loops are noted. These findings are again consistent with small-bowel obstruction. Contrast is noted kidney from prior CT. No free air. No pneumatosis. Right hip replacement. Neurostimulator.  IMPRESSION: Persistent changes of proximal small bowel obstruction. NG tube is noted with its tip projected over the stomach.   Electronically Signed   By: Marcello Moores  Register   On: 05/17/2014 17:08    Anti-infectives: Anti-infectives    None       Assessment/Plan Recurrent SBO, S/p multiple abdominal:S/p colectomy 2001, Small bowel resection 2003, Gastrocutaneous fistula closure, exploratory lap, lysis of adhesions, gastrostomy tube 2011, hx of hysterectomy, appendectomy -Conservative measures:  Bowel rest, NG tube, IVF, pain control, antiemetics -KUB shows persistent SBO -NG output 976mL/24hr -Await return of bowel function, hopefully she will not need surgical intervention COPD, home O2 dependant 24/7. CAD/AF/PTVP/Hx of CHF/Cardiomyopathy GERD Chronic pain/fibromyalgia Gastroparesis/hx of GI bleed on Pradaxa Hypertension Hx of diverticulitis Remote hx of hepatitis     LOS: 1 day    DORT, Jaimy Kliethermes 05/18/2014, 9:53 AM Pager: [phone]

## 2014-05-18 NOTE — Progress Notes (Signed)
Echocardiogram 2D Echocardiogram has been performed.  Marijean Montanye 05/18/2014, 10:40 AM

## 2014-05-19 LAB — CBC
HCT: 36.3 % (ref 36.0–46.0)
Hemoglobin: 11.4 g/dL — ABNORMAL LOW (ref 12.0–15.0)
MCH: 28.9 pg (ref 26.0–34.0)
MCHC: 31.4 g/dL (ref 30.0–36.0)
MCV: 92.1 fL (ref 78.0–100.0)
Platelets: 339 10*3/uL (ref 150–400)
RBC: 3.94 MIL/uL (ref 3.87–5.11)
RDW: 13.7 % (ref 11.5–15.5)
WBC: 9.5 10*3/uL (ref 4.0–10.5)

## 2014-05-19 LAB — BASIC METABOLIC PANEL
Anion gap: 9 (ref 5–15)
BUN: 9 mg/dL (ref 6–23)
CO2: 21 mmol/L (ref 19–32)
Calcium: 5.5 mg/dL — CL (ref 8.4–10.5)
Chloride: 109 mmol/L (ref 96–112)
Creatinine, Ser: 0.52 mg/dL (ref 0.50–1.10)
GFR calc Af Amer: >90 mL/min (ref >90)
GFR calc non Af Amer: >90 mL/min (ref >90)
Glucose, Bld: 60 mg/dL — ABNORMAL LOW (ref 70–99)
Potassium: 4.9 mmol/L (ref 3.5–5.1)
Sodium: 139 mmol/L (ref 135–145)

## 2014-05-19 LAB — MAGNESIUM: Magnesium: 1.9 mg/dL (ref 1.5–2.5)

## 2014-05-19 MED ORDER — ZOLPIDEM TARTRATE 5 MG PO TABS: 5.0000 mg | ORAL_TABLET | Freq: Every day | ORAL | Status: DC

## 2014-05-19 MED ORDER — LORAZEPAM 2 MG/ML IJ SOLN
0.5000 mg | Freq: Once | INTRAMUSCULAR | Status: AC
  Administered 2014-05-19: 0.5 mg via INTRAVENOUS
  Filled 2014-05-19: qty 1

## 2014-05-19 MED ORDER — LORAZEPAM 2 MG/ML PO CONC: 0.5000 mg | Freq: Every day | ORAL | Status: DC

## 2014-05-19 MED ORDER — SODIUM CHLORIDE 0.9 % IV SOLN
1.0000 mg/kg/h | INTRAVENOUS | Status: DC
  Administered 2014-05-19: 1 mg/kg/h via INTRAVENOUS
  Filled 2014-05-19 (×2): qty 100

## 2014-05-19 MED ORDER — ZOLPIDEM TARTRATE 5 MG PO TABS
5.0000 mg | ORAL_TABLET | Freq: Every day | ORAL | Status: DC
  Administered 2014-05-20 – 2014-05-22 (×3): 5 mg via ORAL
  Filled 2014-05-19 (×3): qty 1

## 2014-05-19 NOTE — Progress Notes (Signed)
[facility] Progress Note     Subjective: Pt states she's had no N/V, abdominal pain and distension improved.  Ambulating some OOB and using IS.  Small amount of flatus this am, but no BM yet.    Objective: Vital signs in last 24 hours: Temp:  [98 F (36.7 C)-98.5 F (36.9 C)] 98 F (36.7 C) (03/02 0422) Pulse Rate:  [72-95] 95 (03/02 0422) Resp:  [16-20] 20 (03/02 0422) BP: (113-145)/(63-78) 131/71 mmHg (03/02 0422) SpO2:  [99 %-100 %] 100 % (03/02 0422) Weight:  [99 lb 10.4 oz (45.2 kg)] 99 lb 10.4 oz (45.2 kg) (03/02 0500) Last BM Date: 05/13/14  Intake/Output from previous day: 03/01 0701 - 03/02 0700 In: -  Out: 1300 [Emesis/NG output:1300] Intake/Output this shift:    PE: Gen:  Alert, NAD, pleasant Abd: Soft, mild tenderness, mild distension, very scant BS, no HSM   Lab Results:   Recent Labs  05/18/14 0525 05/19/14 0850  WBC 7.8 9.5  HGB 11.4* 11.4*  HCT 36.0 36.3  PLT 360 339   BMET  Recent Labs  05/17/14 1136 05/18/14 0525  NA 138 137  K 5.0 5.3*  CL 104 103  CO2 26 28  GLUCOSE 133* 98  BUN 28* 24*  CREATININE 0.87 0.63  CALCIUM 5.6* 5.5*   PT/INR No results for input(s): LABPROT, INR in the last 72 hours. CMP     Component Value Date/Time   NA 137 05/18/2014 0525   K 5.3* 05/18/2014 0525   CL 103 05/18/2014 0525   CO2 28 05/18/2014 0525   GLUCOSE 98 05/18/2014 0525   GLUCOSE 104* 03/11/2006 1059   BUN 24* 05/18/2014 0525   CREATININE 0.63 05/18/2014 0525   CREATININE 0.74 01/09/2013 1453   CALCIUM 5.5* 05/18/2014 0525   PROT 6.4 05/17/2014 1136   ALBUMIN 3.3* 05/17/2014 1136   AST 24 05/17/2014 1136   ALT 21 05/17/2014 1136   ALKPHOS 94 05/17/2014 1136   BILITOT 0.3 05/17/2014 1136   GFRNONAA 89* 05/18/2014 0525   GFRAA >90 05/18/2014 0525   Lipase     Component Value Date/Time   LIPASE 8* 12/11/2011 1455       Studies/Results: Ct Abdomen Pelvis W Contrast  05/17/2014   CLINICAL DATA:  Generalized  abdominal pain.  EXAM: CT ABDOMEN AND PELVIS WITH CONTRAST  TECHNIQUE: Multidetector CT imaging of the abdomen and pelvis was performed using the standard protocol following bolus administration of intravenous contrast.  CONTRAST:  59mL OMNIPAQUE IOHEXOL 300 MG/ML  SOLN  COMPARISON:  CT scan of December 11, 2011.  FINDINGS: Moderate degenerative disc disease is noted at L4-5 with grade 1 anterolisthesis secondary to posterior facet joint hypertrophy. Status post right total hip arthroplasty. Visualized lung bases appear normal.  No gallstones are noted. Stable intrahepatic and extrahepatic biliary dilatation is noted without definite cause of obstruction. The spleen and pancreas appear normal. Adrenal glands and kidneys appear normal without hydronephrosis or renal obstruction. There remains severe proximal small bowel dilatation concerning for distal small bowel obstruction, although definite transition zone is not identified. Stool is noted in the sigmoid colon. Atherosclerotic calcifications of abdominal aorta are noted without aneurysm formation. No abnormal fluid collection is noted. Urinary bladder appears normal. Status post hysterectomy.  IMPRESSION: Stable intrahepatic and extrahepatic biliary dilatation is noted without definite cause of obstruction.  Severe proximal small bowel dilatation is noted without definite transition zone identified. This is concerning for partial distal small bowel obstruction.   Electronically Signed  By: Marijo Conception, M.D.   On: 05/17/2014 14:15   Dg Abd Acute W/chest  05/18/2014   CLINICAL DATA:  History of small bowel obstruction.  EXAM: ACUTE ABDOMEN SERIES (ABDOMEN 2 VIEW & CHEST 1 VIEW)  COMPARISON:  05/17/2014 and chest x-ray 07/30/2011  FINDINGS: Left-sided pacemaker unchanged. Lungs are somewhat hypoinflated with stable subtle focal interstitial density over the right upper lobe. Cardiomediastinal silhouette and remainder of the chest is unchanged.  Abdominal  images demonstrate a nasogastric tube with tip over the mid abdomen right of midline likely over the distal stomach. There is a metallic device with associated small caliber catheter over the left pelvis unchanged. Segment of catheter over the right upper pelvis unchanged.  There are several scattered air-fluid levels without significant change. Air and contrast are present within the colon. There is a mildly dilated air-filled small bowel loop over the right mid abdomen. No evidence of free peritoneal air. Remainder of the exam is unchanged.  IMPRESSION: Single air-filled mildly dilated small bowel loop over the right mid abdomen with Stable scattered air-fluid levels. No free peritoneal air. Findings may be due to early/partial small bowel obstruction.  No acute cardiopulmonary disease.  Enteric tube with tip over the mid abdomen right of midline likely within the distal stomach.   Electronically Signed   By: Marin Olp M.D.   On: 05/18/2014 13:29   Dg Abd Acute W/chest  05/17/2014   CLINICAL DATA:  Abdominal pain.  EXAM: ACUTE ABDOMEN SERIES (ABDOMEN 2 VIEW & CHEST 1 VIEW)  COMPARISON:  CT 05/17/2014.  Chest x-ray 05/15/2014, 07/30/2011.  FINDINGS: NG tube noted with tip in the stomach. Cardiac pacer with lead tips in right atrium and right ventricle. Chronic interstitial changes noted right upper lung.  Dilated proximal small bowel loops are noted. These findings are again consistent with small-bowel obstruction. Contrast is noted kidney from prior CT. No free air. No pneumatosis. Right hip replacement. Neurostimulator.  IMPRESSION: Persistent changes of proximal small bowel obstruction. NG tube is noted with its tip projected over the stomach.   Electronically Signed   By: Marcello Moores  Register   On: 05/17/2014 17:08    Anti-infectives: Anti-infectives    None       Assessment/Plan HD #3 Recurrent SBO, S/p multiple abdominal:S/p colectomy 2001, Small bowel resection 2003, Gastrocutaneous fistula  closure, exploratory lap, lysis of adhesions, gastrostomy tube 2011, hx of hysterectomy, appendectomy -Conservative measures: Bowel rest, NG tube, IVF, pain control, antiemetics -KUB shows persistent SBO -NG output 1364mL/24hr, continue NG for now -Small amount of flatus this am, await better bowel function, hopefully she will not need surgical intervention COPD, home O2 dependant 24/7. CAD/AF/PTVP/Hx of CHF/Cardiomyopathy GERD Chronic pain/fibromyalgia Gastroparesis/hx of GI bleed on Pradaxa Hypertension Hx of diverticulitis Remote hx of hepatitis     LOS: 2 days    DORT, Jillyn Stacey 05/19/2014, 9:57 AM Pager: [phone]

## 2014-05-19 NOTE — Progress Notes (Signed)
[name] BUL:[id number] DOB: May 12, 1945 DOA: 05/17/2014 PCP: Garnet Koyanagi, DO  Brief narrative:  70 ? 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 Patient's last bowel movement 05/13/2014, history of malfunctioning InterStim for urgency and can't( last operation 01/2012 PFTs 06/2008 FEV 170%   Past medical history-As per Problem list Chart reviewed as below- reviewed  Consultants:  General surgery  Procedures:  X-rays  Antibiotics:  none   Subjective  Doing fair Abdominal pain is resolved Still putting out fair amount of NG drainage Passing flatus this morning however no stool as yet   Objective    Interim History:   Telemetry: Normal sinus rhythm   Objective: Filed Vitals:   05/19/14 0422 05/19/14 0500 05/19/14 1000 05/19/14 1340  BP: 131/71  119/63 130/57  Pulse: 95  98 94  Temp: 98 F (36.7 C)  97.9 F (36.6 C) 98.2 F (36.8 C)  TempSrc: Oral  Oral Oral  Resp: 20  18 18   Height:      Weight:  45.2 kg (99 lb 10.4 oz)    SpO2: 100%  97% 100%    Intake/Output Summary (Last 24 hours) at 05/19/14 1702 Last data filed at 05/19/14 1535  Gross per 24 hour  Intake      0 ml  Output   2350 ml  Net  -2350 ml    Exam:  General: EOMI NCAT Cardiovascular: S1-S2 no murmur rub or gallop Respiratory: clinically clear no added sound Abdomen: soft nontender however midline abdominal scars and scars in the abdomen no rebound Skinno lower extremity edema Neurointact  Data Reviewed: Basic Metabolic Panel:  Recent Labs Lab 05/17/14 1136 05/18/14 0525 05/19/14 0850 05/19/14 1439  NA 138  137 139  --   K 5.0 5.3* 4.9  --   CL 104 103 109  --   CO2 26 28 21   --   GLUCOSE 133* 98 60*  --   BUN 28* 24* 9  --   CREATININE 0.87 0.63 0.52  --   CALCIUM 5.6* 5.5* 5.5*  --   MG 2.0  --   --  1.9  PHOS 3.0  --   --   --    Liver Function Tests:  Recent Labs Lab 05/17/14 1136  AST 24  ALT 21  ALKPHOS 94  BILITOT 0.3  PROT 6.4  ALBUMIN 3.3*   No results for input(s): LIPASE, AMYLASE in the last 168 hours. No results for input(s): AMMONIA in the last 168 hours. CBC:  Recent Labs Lab 05/15/14 1601 05/17/14 1136 05/18/14 0525 05/19/14 0850  WBC 9.5 21.3* 7.8 9.5  NEUTROABS 7.1 17.9*  --   --   HGB 12.7 12.7 11.4* 11.4*  HCT 39.7 38.1 36.0 36.3  MCV 90.2 89.4 91.4 92.1  PLT 290 352 360 339   Cardiac Enzymes: No results for input(s): CKTOTAL, CKMB, CKMBINDEX, TROPONINI in the last 168 hours. BNP: Invalid input(s): POCBNP CBG: No results for input(s): GLUCAP in the last 168 hours.  Recent Results (from the past 240 hour(s))  Culture, Urine     Status: None   Collection Time: 05/17/14  3:14 PM  Result Value Ref Range Status   Specimen Description URINE, CLEAN CATCH  Final   Special  Requests NONE  Final   Colony Count NO GROWTH Performed at [facility]   Final   Culture NO GROWTH Performed at [facility]   Final   Report Status 05/18/2014 FINAL  Final  Culture, blood (routine x 2)     Status: None (Preliminary result)   Collection Time: 05/18/14  9:15 AM  Result Value Ref Range Status   Specimen Description BLOOD RIGHT ARM  Final   Special Requests BOTTLES DRAWN AEROBIC AND ANAEROBIC 6CC  Final   Culture   Final           BLOOD CULTURE RECEIVED NO GROWTH TO DATE CULTURE WILL BE HELD FOR 5 DAYS BEFORE ISSUING A FINAL NEGATIVE REPORT Performed at [facility]    Report Status PENDING  Incomplete  Culture, blood (routine x 2)     Status: None (Preliminary result)   Collection Time: 05/18/14  9:25 AM  Result Value Ref Range  Status   Specimen Description BLOOD RIGHT HAND  Final   Special Requests BOTTLES DRAWN AEROBIC ONLY 4CC  Final   Culture   Final           BLOOD CULTURE RECEIVED NO GROWTH TO DATE CULTURE WILL BE HELD FOR 5 DAYS BEFORE ISSUING A FINAL NEGATIVE REPORT Performed at [facility]    Report Status PENDING  Incomplete     Studies:              All Imaging reviewed and is as per above notation   Scheduled Meds: . metoprolol  2.5 mg Intravenous 4 times per day  . tiotropium  18 mcg Inhalation Daily  . zolpidem  5 mg Oral QHS   Continuous Infusions: . sodium chloride 75 mL/hr at 05/19/14 0804  . calcium gluconate infusion(930 mg elemental calcium/L) 1 mg elemental calcium/kg/hr (05/19/14 1215)     Assessment/Plan:  Abdominal pain/Nausea/VomSmall Bowel Obstruction -CT of the abdomen: Stable intra-and extrahepatic biliary dilatation, without definite cause of obstruction. Severe proximal small bowel dilatation without definite transition zone -Likely secondary to previous surgeries with adhesions -Gen. surgery consult appreciated  -Continue conservative management, bowel rest, NG tube, antiemetics, pain control  Essential hypertension -home dose Lasix40 and Coreg 6.25 on holdat this time -Continue metoprolol 2.5 mg IV every 6 hours  History of coronary artery disease -Continue metoprolol 2.5 mg IV every 6 hours -Of note, patient states that she cannot take aspirin or any type of blood thinners due to history of bleeding  Leukocytosis -Likely reactive, appears to have resolved -Was 21.3 upon admissionand has trended down -Patient currently afebrile, with no complaints of urinary symptoms, cough  Asymptomatic bacteriuria -UA: WBC 7-10, negative nitrites, small leukocytes  History of systolic and diastolic heart failure -Echocardiogram in January 2012 showed a grade 1 diastolic dysfunction with an EF of 45-50% -Patient currently appears to be euvolemic -Continue to  monitor daily weights, intake and output -Echocardiogram 3/1 = grade 1 diastolic dysfunction EF 90% PA peak 30 mmHg -Lasix will be held for the time being  History of COPD, oxygen dependent -Currently euvolemic and stable -Continue nasal cannula, 2 L -Patient has no wheezing or complaints of shortness of breath -Continue albuterol as well as Spiriva  Hypocalcemia -Calcium 5.6, correct 6.16 -Patient was given IV calcium gluconate however her calcium failed to increase.  -Calcium 5.5so given an IV infusion of elemental calcium -ionized calcium pending Magnesium 1.9  Recent fall -Patient presented to emergency department on 05/15/2014 after sustaining a fall,  x-ray showed a right 10th rib fracture -Patient also experienced a third middle phalanx dislocation with open fracture -Dr. Burney Gauze recommended ER reduction with washout -right hand appears to be stable, currently has finger splint -Wound care consulted.  25 min   Verneita Griffes, MD  [facility] Pager [phone] 05/19/2014, 5:02 PM    LOS: 2 days

## 2014-05-20 ENCOUNTER — Inpatient Hospital Stay (HOSPITAL_COMMUNITY): Payer: PPO

## 2014-05-20 LAB — CBC WITH DIFFERENTIAL/PLATELET
Basophils Absolute: 0.1 10*3/uL (ref 0.0–0.1)
Basophils Relative: 0 % (ref 0–1)
Eosinophils Absolute: 0.1 10*3/uL (ref 0.0–0.7)
Eosinophils Relative: 1 % (ref 0–5)
HCT: 38.7 % (ref 36.0–46.0)
Hemoglobin: 12.1 g/dL (ref 12.0–15.0)
Lymphocytes Relative: 6 % — ABNORMAL LOW (ref 12–46)
Lymphs Abs: 0.7 10*3/uL (ref 0.7–4.0)
MCH: 28.9 pg (ref 26.0–34.0)
MCHC: 31.3 g/dL (ref 30.0–36.0)
MCV: 92.6 fL (ref 78.0–100.0)
Monocytes Absolute: 0.8 10*3/uL (ref 0.1–1.0)
Monocytes Relative: 7 % (ref 3–12)
Neutro Abs: 10.5 10*3/uL — ABNORMAL HIGH (ref 1.7–7.7)
Neutrophils Relative %: 87 % — ABNORMAL HIGH (ref 43–77)
Platelets: 456 10*3/uL — ABNORMAL HIGH (ref 150–400)
RBC: 4.18 MIL/uL (ref 3.87–5.11)
RDW: 13.9 % (ref 11.5–15.5)
WBC: 12.2 10*3/uL — ABNORMAL HIGH (ref 4.0–10.5)

## 2014-05-20 LAB — BASIC METABOLIC PANEL
Anion gap: 11 (ref 5–15)
BUN: 5 mg/dL — ABNORMAL LOW (ref 6–23)
CO2: 15 mmol/L — ABNORMAL LOW (ref 19–32)
Calcium: 8.4 mg/dL (ref 8.4–10.5)
Chloride: 113 mmol/L — ABNORMAL HIGH (ref 96–112)
Creatinine, Ser: 0.54 mg/dL (ref 0.50–1.10)
GFR calc Af Amer: >90 mL/min (ref >90)
GFR calc non Af Amer: >90 mL/min (ref >90)
Glucose, Bld: 49 mg/dL — ABNORMAL LOW (ref 70–99)
Potassium: 5.1 mmol/L (ref 3.5–5.1)
Sodium: 139 mmol/L (ref 135–145)

## 2014-05-20 LAB — CALCIUM, IONIZED
Calcium, Ion: 0.87 mmol/L — ABNORMAL LOW (ref 1.12–1.32)
Calcium, Ion: 1.35 mmol/L — ABNORMAL HIGH (ref 1.12–1.32)

## 2014-05-20 LAB — COMPREHENSIVE METABOLIC PANEL
ALT: 14 U/L (ref 0–35)
AST: 16 U/L (ref 0–37)
Albumin: 3.2 g/dL — ABNORMAL LOW (ref 3.5–5.2)
Alkaline Phosphatase: 89 U/L (ref 39–117)
Anion gap: 14 (ref 5–15)
BUN: 6 mg/dL (ref 6–23)
CO2: 13 mmol/L — ABNORMAL LOW (ref 19–32)
Calcium: 7.9 mg/dL — ABNORMAL LOW (ref 8.4–10.5)
Chloride: 113 mmol/L — ABNORMAL HIGH (ref 96–112)
Creatinine, Ser: 0.54 mg/dL (ref 0.50–1.10)
GFR calc Af Amer: >90 mL/min (ref >90)
GFR calc non Af Amer: >90 mL/min (ref >90)
Glucose, Bld: 49 mg/dL — ABNORMAL LOW (ref 70–99)
Potassium: 4.9 mmol/L (ref 3.5–5.1)
Sodium: 140 mmol/L (ref 135–145)
Total Bilirubin: 1.1 mg/dL (ref 0.3–1.2)
Total Protein: 6.4 g/dL (ref 6.0–8.3)

## 2014-05-20 LAB — PROTIME-INR
INR: 1.12 (ref 0.00–1.49)
Prothrombin Time: 14.5 seconds (ref 11.6–15.2)

## 2014-05-20 MED ORDER — SODIUM BICARBONATE 8.4 % IV SOLN
INTRAVENOUS | Status: DC
  Administered 2014-05-20 – 2014-05-21 (×2): via INTRAVENOUS
  Filled 2014-05-20 (×4): qty 1000

## 2014-05-20 MED ORDER — SODIUM CHLORIDE 0.9 % IV SOLN
1.0000 mg/kg/h | INTRAVENOUS | Status: DC
  Administered 2014-05-20: 1 mg/kg/h via INTRAVENOUS
  Filled 2014-05-20 (×3): qty 100

## 2014-05-20 MED ORDER — KETOROLAC TROMETHAMINE 15 MG/ML IJ SOLN
15.0000 mg | Freq: Once | INTRAMUSCULAR | Status: AC
  Administered 2014-05-20: 15 mg via INTRAVENOUS
  Filled 2014-05-20: qty 1

## 2014-05-20 MED ORDER — HYDROMORPHONE HCL 1 MG/ML IJ SOLN
1.0000 mg | INTRAMUSCULAR | Status: DC | PRN
  Administered 2014-05-20: 2 mg via INTRAVENOUS
  Administered 2014-05-20: 1 mg via INTRAVENOUS
  Administered 2014-05-20 – 2014-05-21 (×7): 2 mg via INTRAVENOUS
  Filled 2014-05-20 (×9): qty 2

## 2014-05-20 MED ORDER — HEPARIN SODIUM (PORCINE) 5000 UNIT/ML IJ SOLN
5000.0000 [IU] | Freq: Three times a day (TID) | INTRAMUSCULAR | Status: DC
  Administered 2014-05-20 – 2014-05-23 (×10): 5000 [IU] via SUBCUTANEOUS
  Filled 2014-05-20 (×13): qty 1

## 2014-05-20 MED ORDER — MAGNESIUM SULFATE IN D5W 10-5 MG/ML-% IV SOLN
1.0000 g | Freq: Once | INTRAVENOUS | Status: AC
  Administered 2014-05-20: 1 g via INTRAVENOUS
  Filled 2014-05-20: qty 100

## 2014-05-20 NOTE — Progress Notes (Signed)
PT Cancellation Note  Patient Details Name: [name] MRN: [id number] DOB: 1945/09/08   Cancelled Treatment:    Reason Eval/Treat Not Completed: Pain limiting ability to participate (c/o headache, would like to mobilize tomorrow)   York Ram E 05/20/2014, 2:05 PM Carmelia Bake, PT, DPT 05/20/2014 Pager: [phone]

## 2014-05-20 NOTE — Progress Notes (Signed)
[name] MVH:[id number] DOB: 01-14-1946 DOA: 05/17/2014 PCP: Garnet Koyanagi, DO  Brief narrative:  20 ? 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 Patient's last bowel movement 05/13/2014, history of malfunctioning InterStim for urgency and can't last operation 01/2012 PFTs 06/2008 FEV 170%   Past medical history-As per Problem list Chart reviewed as below- reviewed  Consultants:  General surgery  Procedures:  X-rays  Antibiotics:  none   Subjective   Headache today given Toradol Only slightly improved No chest pain no nausea no cough no cold Mild abdominal pain No chest pain   Objective    Interim History: Abdominal x-ray shows nonobstructive gas pattern  Telemetry: Normal sinus rhythm   Objective: Filed Vitals:   05/20/14 0201 05/20/14 0624 05/20/14 0734 05/20/14 0848  BP: 129/53 138/66 124/56   Pulse: 100 109 89   Temp: 98.1 F (36.7 C) 98.4 F (36.9 C) 98.2 F (36.8 C)   TempSrc: Oral Oral Oral   Resp: 16 18 20    Height:      Weight:  45.1 kg (99 lb 6.8 oz)    SpO2: 100% 100% 100% 99%    Intake/Output Summary (Last 24 hours) at 05/20/14 1503 Last data filed at 05/20/14 0700  Gross per 24 hour  Intake      0 ml  Output   1250 ml  Net  -1250 ml    Exam:  General: EOMI NCAT Cardiovascular: S1-S2 no murmur rub or gallop Respiratory: clinically clear no added sound Abdomen: mildly tedner, faint BS heard. scars in the abdomen no rebound Skin:no lower extremity edema Neuro:intact  Data Reviewed: Basic Metabolic Panel:  Recent Labs Lab 05/17/14 1136 05/18/14 0525  05/19/14 0850 05/19/14 1439 05/20/14 0600  NA 138 137 139  --  140  K 5.0 5.3* 4.9  --  4.9  CL 104 103 109  --  113*  CO2 26 28 21   --  13*  GLUCOSE 133* 98 60*  --  49*  BUN 28* 24* 9  --  6  CREATININE 0.87 0.63 0.52  --  0.54  CALCIUM 5.6* 5.5* 5.5*  --  7.9*  MG 2.0  --   --  1.9  --   PHOS 3.0  --   --   --   --    Liver Function Tests:  Recent Labs Lab 05/17/14 1136 05/20/14 0600  AST 24 16  ALT 21 14  ALKPHOS 94 89  BILITOT 0.3 1.1  PROT 6.4 6.4  ALBUMIN 3.3* 3.2*   No results for input(s): LIPASE, AMYLASE in the last 168 hours. No results for input(s): AMMONIA in the last 168 hours. CBC:  Recent Labs Lab 05/15/14 1601 05/17/14 1136 05/18/14 0525 05/19/14 0850 05/20/14 0600  WBC 9.5 21.3* 7.8 9.5 12.2*  NEUTROABS 7.1 17.9*  --   --  10.5*  HGB 12.7 12.7 11.4* 11.4* 12.1  HCT 39.7 38.1 36.0 36.3 38.7  MCV 90.2 89.4 91.4 92.1 92.6  PLT 290 352 360 339 456*   Cardiac Enzymes: No results for input(s): CKTOTAL, CKMB, CKMBINDEX, TROPONINI in the last 168 hours. BNP: Invalid input(s): POCBNP CBG: No results for input(s): GLUCAP in the last 168 hours.  Recent Results (from the past 240 hour(s))  Culture, Urine     Status: None   Collection Time: 05/17/14  3:14 PM  Result Value Ref Range Status   Specimen Description URINE, CLEAN CATCH  Final   Special Requests NONE  Final   Colony Count NO GROWTH Performed at [facility]   Final   Culture NO GROWTH Performed at [facility]   Final   Report Status 05/18/2014 FINAL  Final  Culture, blood (routine x 2)     Status: None (Preliminary result)   Collection Time: 05/18/14  9:15 AM  Result Value Ref Range Status   Specimen Description BLOOD RIGHT ARM  Final   Special Requests BOTTLES DRAWN AEROBIC AND ANAEROBIC 6CC  Final   Culture   Final           BLOOD CULTURE RECEIVED NO GROWTH TO DATE CULTURE WILL BE HELD FOR 5 DAYS BEFORE ISSUING A FINAL NEGATIVE REPORT Performed at [facility]    Report Status PENDING  Incomplete  Culture, blood (routine x 2)     Status: None (Preliminary result)   Collection Time: 05/18/14  9:25 AM  Result Value Ref Range Status   Specimen Description BLOOD RIGHT HAND  Final   Special Requests BOTTLES DRAWN AEROBIC ONLY 4CC  Final   Culture   Final           BLOOD CULTURE RECEIVED NO GROWTH TO DATE CULTURE WILL BE HELD FOR 5 DAYS BEFORE ISSUING A FINAL NEGATIVE REPORT Performed at [facility]    Report Status PENDING  Incomplete     Studies:              All Imaging reviewed and is as per above notation   Scheduled Meds: . heparin subcutaneous  5,000 Units Subcutaneous 3 times per day  . metoprolol  2.5 mg Intravenous 4 times per day  . tiotropium  18 mcg Inhalation Daily  . zolpidem  5 mg Oral QHS   Continuous Infusions: . calcium gluconate infusion(930 mg elemental calcium/L)    . dextrose 5 % 1,000 mL with sodium bicarbonate 100 mEq infusion       Assessment/Plan:  Abdominal pain/Nausea/VomSmall Bowel Obstruction -CT of the abdomen: Stable intra-and extrahepatic biliary dilatation, without definite cause of obstruction. Severe proximal small bowel dilatation without definite transition zone -Likely secondary to previous surgeries with adhesions -Gen. surgery consult appreciated  -Continue conservative management, bowel rest, NG tube, antiemetics, pain control -Bowel gas pattern is stable on x-ray 3/3 I will increase her pain control medications as she has 5/10 pain from Dilaudid 1 mg every 3-1-2 mg every 2  Essential hypertension -home dose Lasix40 and Coreg 6.25 on hold at this time -Continue metoprolol 2.5 mg IV every 6 hours  History of coronary artery disease -Continue metoprolol 2.5 mg IV every 6 hours -Of note, patient states that she cannot take aspirin or any type of blood thinners due to history of bleeding  Leukocytosis -Likely reactive, appears to have resolved -Was 21.3 upon and had  trended down however was 12 3/3 -Low threshold to image with CT scan in a.m.  Asymptomatic bacteriuria -UA: WBC 7-10, negative nitrites, small leukocytes  History of systolic and diastolic heart failure-euvolemic -Echocardiogram in January 2012 showed a grade 1 diastolic dysfunction with an EF of 45-50% -Continue to monitor daily weights, intake and output -Echocardiogram 3/1 = grade 1 diastolic dysfunction EF 10% PA peak 30 mmHg -Lasix held since admit  History of COPD, oxygen dependent -Currently  euvolemic and stable -Continue nasal cannula, 2 L -Patient has no wheezing or complaints of shortness of breath -Continue albuterol as well as Spiriva  Hypocalcemia with metabolic acidosis secondary to GI losses -Calcium 5.6, correct 6.16 -Patient was given IV calcium gluconate however her calcium failed to increase.  -Calcium 5.5 05/19/14 ? IV infusion of elemental calcium -ionized calcium improved to 0.87 Replacing Mag to goal, Rpt Bmet + ionized calcium -I discussed case with nephrology-we will need to check her ionized calcium closely -Patient will also need bicarbonate which I started as D5 with 100 mEq to correct the acidosis -Hopefully she will resolve  Recent fall -Patient presented to emergency department on 05/15/2014 after sustaining a fall, x-ray showed a right 10th rib fracture -Patient also experienced a third middle phalanx dislocation with open fracture -Dr. Burney Gauze recommended ER reduction with washout -right hand appears to be stable, currently has finger splint -Wound care consulted.  25 min   Verneita Griffes, MD  [facility] Pager [phone] 05/20/2014, 3:03 PM    LOS: 3 days

## 2014-05-20 NOTE — Progress Notes (Signed)
CARE MANAGEMENT NOTE 05/20/2014  Patient:  [name], [name]   Account Number:  [id number]  Date Initiated:  05/20/2014  Documentation initiated by:  Edwyna Shell  Subjective/Objective Assessment:   69 yo female admitted with SBO from home     Action/Plan:   discharge planning   Anticipated DC Date:  05/23/2014   Anticipated DC Plan:  [place]  CM consult      Choice offered to / List presented to:             Status of service:  In process, will continue to follow Medicare Important Message given?   (If response is "NO", the following Medicare IM given date fields will be blank) Date Medicare IM given:   Medicare IM given by:   Date Additional Medicare IM given:   Additional Medicare IM given by:    Discharge Disposition:    Per UR Regulation:    If discussed at Long Length of Stay Meetings, dates discussed:    Comments:  05/20/14 Edwyna Shell RN BSN CM [phone] Patient stated that she lives at home with spouse and has home O2 and has had [place] services with [facility] in the past and if needs HH again would use [facility]. Will follow for discharge planning needs

## 2014-05-20 NOTE — Progress Notes (Signed)
Subjective: No real change, she has had a headache for 2 days, No real flatus, once yesterday.  She still feels a little distended and I don't hear bowel sounds.  Objective: Vital signs in last 24 hours: Temp:  [97.8 F (36.6 C)-98.4 F (36.9 C)] 98.2 F (36.8 C) (03/03 0734) Pulse Rate:  [89-109] 89 (03/03 0734) Resp:  [16-20] 20 (03/03 0734) BP: (116-138)/(53-66) 124/56 mmHg (03/03 0734) SpO2:  [97 %-100 %] 99 % (03/03 0848) Weight:  [99 lb 6.8 oz (45.1 kg)] 99 lb 6.8 oz (45.1 kg) (03/03 0624) Last BM Date: 05/13/14 800 from the NG recorded. Afebrile, she is a little tachycardic, BP stable, sats good on O2. WBC is up some Film is pending Intake/Output from previous day: 03/02 0701 - 03/03 0700 In: -  Out: [place] [Urine:1050; Emesis/NG output:800] Intake/Output this shift:    General appearance: alert, cooperative, no distress and complaining of headache. GI: soft, mildly distended, no Bs, flatus x 1 yesterday.  Lab Results:   Recent Labs  05/19/14 0850 05/20/14 0600  WBC 9.5 12.2*  HGB 11.4* 12.1  HCT 36.3 38.7  PLT 339 456*    BMET  Recent Labs  05/19/14 0850 05/20/14 0600  NA 139 140  K 4.9 4.9  CL 109 113*  CO2 21 13*  GLUCOSE 60* 49*  BUN 9 6  CREATININE 0.52 0.54  CALCIUM 5.5* 7.9*   PT/INR  Recent Labs  05/20/14 0832  LABPROT 14.5  INR 1.12     Recent Labs Lab 05/17/14 1136 05/20/14 0600  AST 24 16  ALT 21 14  ALKPHOS 94 89  BILITOT 0.3 1.1  PROT 6.4 6.4  ALBUMIN 3.3* 3.2*     Lipase     Component Value Date/Time   LIPASE 8* 12/11/2011 1455     Studies/Results: Dg Abd Acute W/chest  05/18/2014   CLINICAL DATA:  History of small bowel obstruction.  EXAM: ACUTE ABDOMEN SERIES (ABDOMEN 2 VIEW & CHEST 1 VIEW)  COMPARISON:  05/17/2014 and chest x-ray 07/30/2011  FINDINGS: Left-sided pacemaker unchanged. Lungs are somewhat hypoinflated with stable subtle focal interstitial density over the right upper lobe. Cardiomediastinal  silhouette and remainder of the chest is unchanged.  Abdominal images demonstrate a nasogastric tube with tip over the mid abdomen right of midline likely over the distal stomach. There is a metallic device with associated small caliber catheter over the left pelvis unchanged. Segment of catheter over the right upper pelvis unchanged.  There are several scattered air-fluid levels without significant change. Air and contrast are present within the colon. There is a mildly dilated air-filled small bowel loop over the right mid abdomen. No evidence of free peritoneal air. Remainder of the exam is unchanged.  IMPRESSION: Single air-filled mildly dilated small bowel loop over the right mid abdomen with Stable scattered air-fluid levels. No free peritoneal air. Findings may be due to early/partial small bowel obstruction.  No acute cardiopulmonary disease.  Enteric tube with tip over the mid abdomen right of midline likely within the distal stomach.   Electronically Signed   By: Marin Olp M.D.   On: 05/18/2014 13:29    Medications: . magnesium sulfate 1 - 4 g bolus IVPB  1 g Intravenous Once  . metoprolol  2.5 mg Intravenous 4 times per day  . tiotropium  18 mcg Inhalation Daily  . zolpidem  5 mg Oral QHS    Assessment/Plan HD #4 Recurrent SBO, S/p multiple abdominal:S/p colectomy 2001, Small bowel resection 2003,  Gastrocutaneous fistula closure, exploratory lap, lysis of adhesions, gastrostomy tube 2011, hx of hysterectomy, appendectomy COPD, home O2 dependant 24/7. CAD/AF/PTVP/Hx of CHF/Cardiomyopathy GERD Chronic pain/fibromyalgia Gastroparesis/hx of GI bleed on Pradaxa Hypertension Hx of diverticulitis Remote hx of hepatitis  DVT prophylaxis:  SCD/Heparin   Plan:  I don't see much improvement from admit.  Film pending.  she does not want to get OOB because of headache, will add heparin for DVT.  Discuss with Dr. Redmond Pulling.    LOS: 3 days    Sharbel Sahagun 05/20/2014

## 2014-05-21 LAB — BASIC METABOLIC PANEL
Anion gap: 12 (ref 5–15)
BUN: <5 mg/dL — ABNORMAL LOW (ref 6–23)
CO2: 21 mmol/L (ref 19–32)
Calcium: 9 mg/dL (ref 8.4–10.5)
Chloride: 105 mmol/L (ref 96–112)
Creatinine, Ser: 0.57 mg/dL (ref 0.50–1.10)
GFR calc Af Amer: >90 mL/min (ref >90)
GFR calc non Af Amer: >90 mL/min (ref >90)
Glucose, Bld: 90 mg/dL (ref 70–99)
Potassium: 3.8 mmol/L (ref 3.5–5.1)
Sodium: 138 mmol/L (ref 135–145)

## 2014-05-21 LAB — VITAMIN D 25 HYDROXY (VIT D DEFICIENCY, FRACTURES): Vit D, 25-Hydroxy: 4.8 ng/mL — ABNORMAL LOW (ref 30.0–100.0)

## 2014-05-21 LAB — CALCIUM, IONIZED: Calcium, Ion: 1.37 mmol/L — ABNORMAL HIGH (ref 1.12–1.32)

## 2014-05-21 MED ORDER — TIZANIDINE HCL 4 MG PO TABS
4.0000 mg | ORAL_TABLET | Freq: Three times a day (TID) | ORAL | Status: DC | PRN
  Filled 2014-05-21: qty 1

## 2014-05-21 MED ORDER — VITAMIN D (ERGOCALCIFEROL) 1.25 MG (50000 UNIT) PO CAPS
50000.0000 [IU] | ORAL_CAPSULE | ORAL | Status: DC
  Administered 2014-05-21: 50000 [IU] via ORAL
  Filled 2014-05-21: qty 1

## 2014-05-21 MED ORDER — HYDROMORPHONE HCL 2 MG PO TABS
1.0000 mg | ORAL_TABLET | ORAL | Status: DC | PRN
  Administered 2014-05-21 – 2014-05-22 (×7): 2 mg via ORAL
  Filled 2014-05-21 (×7): qty 1

## 2014-05-21 MED ORDER — GABAPENTIN 300 MG PO CAPS
600.0000 mg | ORAL_CAPSULE | Freq: Three times a day (TID) | ORAL | Status: DC
  Administered 2014-05-21 – 2014-05-23 (×6): 600 mg via ORAL
  Filled 2014-05-21 (×8): qty 2

## 2014-05-21 MED ORDER — BUTALBITAL-APAP-CAFFEINE 50-325-40 MG PO TABS: 1.0000 | ORAL_TABLET | ORAL | Status: DC | PRN

## 2014-05-21 MED ORDER — CARVEDILOL 6.25 MG PO TABS
6.2500 mg | ORAL_TABLET | Freq: Two times a day (BID) | ORAL | Status: DC
  Administered 2014-05-21 – 2014-05-23 (×4): 6.25 mg via ORAL
  Filled 2014-05-21 (×6): qty 1

## 2014-05-21 NOTE — Progress Notes (Signed)
Patient ID: [name], female   DOB: 08/19/1945, 69 y.o.   MRN: [id number]    Subjective: Pt feels better today.  Passed some flatus.  No issues with NGT clamping except some belching, but no nausea.  No pain  Objective: Vital signs in last 24 hours: Temp:  [98 F (36.7 C)-98.3 F (36.8 C)] 98.3 F (36.8 C) (03/04 0457) Pulse Rate:  [95-109] 109 (03/04 0457) Resp:  [18-20] 20 (03/04 0457) BP: (133-135)/(56-68) 135/58 mmHg (03/04 0457) SpO2:  [98 %-100 %] 98 % (03/04 0457) Last BM Date: 05/13/14  Intake/Output from previous day: 06-02-22 0701 - 03/04 0700 In: 1514 [I.V.:1514] Out: 200 [Emesis/NG output:200] Intake/Output this shift:    PE: Abd: soft, ND, mild tenderness (she states this is chronic), few BS, NGT with no residual at all after 4-5 hrs of clamping  Lab Results:   Recent Labs  05/19/14 0850 06-02-2014 0600  WBC 9.5 12.2*  HGB 11.4* 12.1  HCT 36.3 38.7  PLT 339 456*   BMET  Recent Labs  2014/06/02 1533 05/21/14 0530  NA 139 138  K 5.1 3.8  CL 113* 105  CO2 15* 21  GLUCOSE 49* 90  BUN 5* <5*  CREATININE 0.54 0.57  CALCIUM 8.4 9.0   PT/INR  Recent Labs  06-02-2014 0832  LABPROT 14.5  INR 1.12   CMP     Component Value Date/Time   NA 138 05/21/2014 0530   K 3.8 05/21/2014 0530   CL 105 05/21/2014 0530   CO2 21 05/21/2014 0530   GLUCOSE 90 05/21/2014 0530   GLUCOSE 104* 03/11/2006 1059   BUN <5* 05/21/2014 0530   CREATININE 0.57 05/21/2014 0530   CREATININE 0.74 01/09/2013 1453   CALCIUM 9.0 05/21/2014 0530   PROT 6.4 Jun 02, 2014 0600   ALBUMIN 3.2* Jun 02, 2014 0600   AST 16 06-02-14 0600   ALT 14 06-02-2014 0600   ALKPHOS 89 02-Jun-2014 0600   BILITOT 1.1 June 02, 2014 0600   GFRNONAA >90 05/21/2014 0530   GFRAA >90 05/21/2014 0530   Lipase     Component Value Date/Time   LIPASE 8* 12/11/2011 1455       Studies/Results: Dg Abd 2 Views  06-02-14   CLINICAL DATA:  Mid abdominal discomfort, nausea  EXAM: ABDOMEN - 2 VIEW   COMPARISON:  05/18/2014  FINDINGS: Enteric tube in the distal stomach.  Nonobstructive bowel gas pattern.  Contrast within the cecum, appendix, and likely sigmoid colon/rectum.  Sacral stimulator.  IMPRESSION: Enteric tube in the distal stomach.  Nonobstructive bowel gas pattern.  Contrast within the colon, as above.   Electronically Signed   By: Julian Hy M.D.   On: 06-02-2014 11:30    Anti-infectives: Anti-infectives    None       Assessment/Plan  HD #5 for SBO, very complicated abdominal surgical hx -abdominal films have normalized and patient tolerated her NGT clamping trials well.  Will DC NGT and give clear liquids today. -mobilize and pulm toilet  VTE prophylaxis -SCDs/heparin  LOS: 4 days    Shaft Corigliano E 05/21/2014, 8:18 AM Pager: [phone]

## 2014-05-21 NOTE — Progress Notes (Signed)
PT Cancellation Note  Patient Details Name: [name] MRN: [id number] DOB: 1946/01/17   Cancelled Treatment:    Reason Eval/Treat Not Completed: Pain limiting ability to participate (pt c/o migraine pain, just received pain medication, will follow. )   Philomena Doheny 05/21/2014, 12:39 PM  [phone]

## 2014-05-21 NOTE — Progress Notes (Signed)
[name] ZOX:[id number] DOB: 09/30/1945 DOA: 05/17/2014 PCP: Garnet Koyanagi, DO  Brief narrative:  53 ? 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 Patient's last bowel movement 05/13/2014, history of malfunctioning InterStim for urgency and can't last operation 01/2012 PFTs 06/2008 FEV 170%   Past medical history-As per Problem list Chart reviewed as below- reviewed  Consultants:  General surgery  Procedures:  X-rays  Antibiotics:  none   Subjective      Objective    Interim History: Abdominal x-ray shows nonobstructive gas pattern  Telemetry: Normal sinus rhythm   Objective: Filed Vitals:   05/21/14 0015 05/21/14 0457 05/21/14 1000 05/21/14 1349  BP: 133/68 135/58 129/62 160/72  Pulse: 107 109 102 88  Temp:  98.3 F (36.8 C) 98.5 F (36.9 C) 97.8 F (36.6 C)  TempSrc:  Oral Oral Oral  Resp:  20 18 18   Height:      Weight:      SpO2:  98% 99% 100%    Intake/Output Summary (Last 24 hours) at 05/21/14 1548 Last data filed at 05/21/14 1100  Gross per 24 hour  Intake 1753.95 ml  Output    200 ml  Net 1553.95 ml    Exam:  General: EOMI NCAT Cardiovascular: S1-S2 no murmur rub or gallop Respiratory: clinically clear no added sound Abdomen: mildly tedner, faint BS heard. scars in the abdomen no rebound Skin:no lower extremity edema Neuro:intact  Data Reviewed: Basic Metabolic Panel:  Recent Labs Lab 05/17/14 1136 05/18/14 0525 05/19/14 0850 05/19/14 1439 05/20/14 0600 05/20/14 1533 05/21/14 0530  NA 138 137 139  --  140 139 138  K 5.0 5.3* 4.9  --  4.9 5.1 3.8  CL 104  103 109  --  113* 113* 105  CO2 26 28 21   --  13* 15* 21  GLUCOSE 133* 98 60*  --  49* 49* 90  BUN 28* 24* 9  --  6 5* <5*  CREATININE 0.87 0.63 0.52  --  0.54 0.54 0.57  CALCIUM 5.6* 5.5* 5.5*  --  7.9* 8.4 9.0  MG 2.0  --   --  1.9  --   --   --   PHOS 3.0  --   --   --   --   --   --    Liver Function Tests:  Recent Labs Lab 05/17/14 1136 05/20/14 0600  AST 24 16  ALT 21 14  ALKPHOS 94 89  BILITOT 0.3 1.1  PROT 6.4 6.4  ALBUMIN 3.3* 3.2*   No results for input(s): LIPASE, AMYLASE in the last 168 hours. No results for input(s): AMMONIA in the last 168 hours. CBC:  Recent Labs Lab 05/15/14 1601 05/17/14 1136 05/18/14 0525 05/19/14 0850 05/20/14 0600  WBC 9.5 21.3* 7.8 9.5 12.2*  NEUTROABS 7.1 17.9*  --   --  10.5*  HGB 12.7 12.7 11.4* 11.4* 12.1  HCT 39.7 38.1 36.0 36.3 38.7  MCV 90.2 89.4 91.4 92.1 92.6  PLT 290 352 360 339 456*   Cardiac Enzymes: No results for input(s): CKTOTAL, CKMB, CKMBINDEX, TROPONINI in the last 168 hours. BNP: Invalid input(s): POCBNP CBG: No results for input(s): GLUCAP in the last 168 hours.  Recent Results (from the past 240  hour(s))  Culture, Urine     Status: None   Collection Time: 05/17/14  3:14 PM  Result Value Ref Range Status   Specimen Description URINE, CLEAN CATCH  Final   Special Requests NONE  Final   Colony Count NO GROWTH Performed at [facility]   Final   Culture NO GROWTH Performed at [facility]   Final   Report Status 05/18/2014 FINAL  Final  Culture, blood (routine x 2)     Status: None (Preliminary result)   Collection Time: 05/18/14  9:15 AM  Result Value Ref Range Status   Specimen Description BLOOD RIGHT ARM  Final   Special Requests BOTTLES DRAWN AEROBIC AND ANAEROBIC 6CC  Final   Culture   Final           BLOOD CULTURE RECEIVED NO GROWTH TO DATE CULTURE WILL BE HELD FOR 5 DAYS BEFORE ISSUING A FINAL NEGATIVE REPORT Performed at [facility]    Report Status PENDING   Incomplete  Culture, blood (routine x 2)     Status: None (Preliminary result)   Collection Time: 05/18/14  9:25 AM  Result Value Ref Range Status   Specimen Description BLOOD RIGHT HAND  Final   Special Requests BOTTLES DRAWN AEROBIC ONLY 4CC  Final   Culture   Final           BLOOD CULTURE RECEIVED NO GROWTH TO DATE CULTURE WILL BE HELD FOR 5 DAYS BEFORE ISSUING A FINAL NEGATIVE REPORT Performed at [facility]    Report Status PENDING  Incomplete     Studies:              All Imaging reviewed and is as per above notation   Scheduled Meds: . heparin subcutaneous  5,000 Units Subcutaneous 3 times per day  . metoprolol  2.5 mg Intravenous 4 times per day  . tiotropium  18 mcg Inhalation Daily  . Vitamin D (Ergocalciferol)  50,000 Units Oral Q7 days  . zolpidem  5 mg Oral QHS   Continuous Infusions: . dextrose 5 % 1,000 mL with sodium bicarbonate 100 mEq infusion 75 mL/hr at 05/21/14 0455     Assessment/Plan:  Abdominal pain/Nausea/VomSmall Bowel Obstruction -CT of the abdomen: Stable intra-and extrahepatic biliary dilatation, without definite cause of obstruction. Severe proximal small bowel dilatation without definite transition zone --Now passing stool -Gen. surgery consult appreciated   Essential hypertension -home dose [id number] on hold-I've restarted on 3/4 her Coreg 6.25 on hold at this time  History of coronary artery disease -Continue Coreg as above -Of note, patient states that she cannot take aspirin or any type of blood thinners due to history of bleeding  Leukocytosis -Likely reactive, appears to have resolved -Was 21.3 upon and had trended down however was 12 3/3 -Low threshold to image with CT scan in a.m.  Asymptomatic bacteriuria -UA: WBC 7-10, negative nitrites, small leukocytes  History of systolic and diastolic heart failure-euvolemic -Echocardiogram in January 2012 showed a grade 1 diastolic dysfunction with an EF of 45-50% -Continue to  monitor daily weights, intake and output -Echocardiogram 3/1 = grade 1 diastolic dysfunction EF 64% PA peak 30 mmHg -Lasix held since admit  History of COPD, oxygen dependent -Currently euvolemic and stable -Continue nasal cannula, 2 L -Patient has no wheezing or complaints of shortness of breath -Continue albuterol as well as Spiriva  Hypocalcemia with metabolic acidosis secondary to GI losses -Patient was given IV calcium gluconate however her calcium failed to  increase.  -Calcium 5.5 05/19/14 ? IV infusion of elemental calcium -ionized calcium improved to 0.87 Replacing Mag to goal, Rpt Bmet + ionized calcium -I discussed case with nephrology on 05/20/58-we will need to check her ionized calcium closely -Patient will also need bicarbonate which I started as D5 with 100 mEq to correct the acidosis--as her acidosis corrected rapidly we discontinued her bicarbonate on 3/4 -Vitamin D level was very low at 4 therefore we will supplement with 50,000 units of vitamin D every 7 daily and this is likely etiology  Recent fall -Patient presented to emergency department on 05/15/2014 after sustaining a fall, x-ray showed a right 10th rib fracture -Patient also experienced a third middle phalanx dislocation with open fracture -Dr. Burney Gauze recommended ER reduction with washout will need follow-up as an outpatient with them  restarted gabapentin at a lower dose of 3 times a day Hold on oral opiates -right hand appears to be stable, currently has finger splint -Wound care consulted.  She has a headache so we will start Fioricet  25 min   Verneita Griffes, MD  [facility] Pager [phone] 05/21/2014, 3:48 PM    LOS: 4 days

## 2014-05-22 LAB — BASIC METABOLIC PANEL
Anion gap: 7 (ref 5–15)
BUN: <5 mg/dL — ABNORMAL LOW (ref 6–23)
CO2: 27 mmol/L (ref 19–32)
Calcium: 7.6 mg/dL — ABNORMAL LOW (ref 8.4–10.5)
Chloride: 101 mmol/L (ref 96–112)
Creatinine, Ser: 0.4 mg/dL — ABNORMAL LOW (ref 0.50–1.10)
GFR calc Af Amer: >90 mL/min (ref >90)
GFR calc non Af Amer: >90 mL/min (ref >90)
Glucose, Bld: 87 mg/dL (ref 70–99)
Potassium: 4.2 mmol/L (ref 3.5–5.1)
Sodium: 135 mmol/L (ref 135–145)

## 2014-05-22 LAB — CBC WITH DIFFERENTIAL/PLATELET
Basophils Absolute: 0 10*3/uL (ref 0.0–0.1)
Basophils Relative: 0 % (ref 0–1)
Eosinophils Absolute: 0.1 10*3/uL (ref 0.0–0.7)
Eosinophils Relative: 1 % (ref 0–5)
HCT: 32 % — ABNORMAL LOW (ref 36.0–46.0)
Hemoglobin: 10.8 g/dL — ABNORMAL LOW (ref 12.0–15.0)
Lymphocytes Relative: 7 % — ABNORMAL LOW (ref 12–46)
Lymphs Abs: 0.5 10*3/uL — ABNORMAL LOW (ref 0.7–4.0)
MCH: 29.3 pg (ref 26.0–34.0)
MCHC: 33.8 g/dL (ref 30.0–36.0)
MCV: 86.7 fL (ref 78.0–100.0)
Monocytes Absolute: 0.8 10*3/uL (ref 0.1–1.0)
Monocytes Relative: 12 % (ref 3–12)
Neutro Abs: 5.1 10*3/uL (ref 1.7–7.7)
Neutrophils Relative %: 80 % — ABNORMAL HIGH (ref 43–77)
Platelets: 331 10*3/uL (ref 150–400)
RBC: 3.69 MIL/uL — ABNORMAL LOW (ref 3.87–5.11)
RDW: 13.3 % (ref 11.5–15.5)
WBC: 6.3 10*3/uL (ref 4.0–10.5)

## 2014-05-22 MED ORDER — HYDROMORPHONE HCL 2 MG PO TABS
2.0000 mg | ORAL_TABLET | Freq: Once | ORAL | Status: AC
  Administered 2014-05-22: 2 mg via ORAL
  Filled 2014-05-22: qty 1

## 2014-05-22 MED ORDER — CALCIUM CARBONATE 1250 (500 CA) MG PO TABS
1000.0000 mg | ORAL_TABLET | Freq: Three times a day (TID) | ORAL | Status: DC
Start: 2014-05-22 — End: 2014-05-23
  Administered 2014-05-22 – 2014-05-23 (×2): 1000 mg via ORAL
  Filled 2014-05-22 (×6): qty 2

## 2014-05-22 MED ORDER — SIMETHICONE 80 MG PO CHEW
80.0000 mg | CHEWABLE_TABLET | Freq: Four times a day (QID) | ORAL | Status: DC | PRN
  Administered 2014-05-22 – 2014-05-23 (×2): 80 mg via ORAL
  Filled 2014-05-22 (×3): qty 1

## 2014-05-22 MED ORDER — HYDROMORPHONE HCL 2 MG PO TABS
1.0000 mg | ORAL_TABLET | ORAL | Status: DC | PRN
  Administered 2014-05-22 – 2014-05-23 (×3): 1 mg via ORAL
  Filled 2014-05-22 (×3): qty 1

## 2014-05-22 NOTE — Progress Notes (Signed)
[name] WVP:[id number] DOB: May 09, 1945 DOA: 05/17/2014 PCP: Garnet Koyanagi, DO  Brief narrative:  61 ? 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 Patient's last bowel movement 05/13/2014, history of malfunctioning InterStim for urgency-last operation 01/2012 PFTs 06/2008 FEV 170%   Past medical history-As per Problem list Chart reviewed as below- reviewed  Consultants:  General surgery  Procedures:  X-rays  Antibiotics:  none   Subjective   Well Flatus + Stool + Pain in R ribs and in Fingers when she fell is 3/10 No abd pain   Objective    Interim History: Abdominal x-ray shows nonobstructive gas pattern  Telemetry: Normal sinus rhythm   Objective: Filed Vitals:   05/22/14 0500 05/22/14 0630 05/22/14 0749 05/22/14 1432  BP:  144/68  110/64  Pulse:  92  84  Temp:  98.4 F (36.9 C)  98.3 F (36.8 C)  TempSrc:  Oral  Oral  Resp:  18  18  Height:      Weight: 45.1 kg (99 lb 6.8 oz)     SpO2:  100% 98% 100%    Intake/Output Summary (Last 24 hours) at 05/22/14 1642 Last data filed at 05/22/14 0746  Gross per 24 hour  Intake    400 ml  Output      0 ml  Net    400 ml    Exam:  General: EOMI NCAT Cardiovascular: S1-S2 no murmur rub or gallop Respiratory: clinically clear no added sound Abdomen: mildly tedner, faint BS heard. scars in the abdomen no rebound Skin:no lower extremity edema Neuro:intact  Data Reviewed: Basic Metabolic Panel:  Recent Labs Lab 05/17/14 1136  05/19/14 0850 05/19/14 1439 05/20/14 0600 05/20/14 1533 05/21/14 0530 05/22/14 0539  NA 138  < > 139   --  140 139 138 135  K 5.0  < > 4.9  --  4.9 5.1 3.8 4.2  CL 104  < > 109  --  113* 113* 105 101  CO2 26  < > 21  --  13* 15* 21 27  GLUCOSE 133*  < > 60*  --  49* 49* 90 87  BUN 28*  < > 9  --  6 5* <5* <5*  CREATININE 0.87  < > 0.52  --  0.54 0.54 0.57 0.40*  CALCIUM 5.6*  < > 5.5*  --  7.9* 8.4 9.0 7.6*  MG 2.0  --   --  1.9  --   --   --   --   PHOS 3.0  --   --   --   --   --   --   --   < > = values in this interval not displayed. Liver Function Tests:  Recent Labs Lab 05/17/14 1136 05/20/14 0600  AST 24 16  ALT 21 14  ALKPHOS 94 89  BILITOT 0.3 1.1  PROT 6.4 6.4  ALBUMIN 3.3* 3.2*   No results for input(s): LIPASE, AMYLASE in the last 168 hours. No results for input(s): AMMONIA in the last 168 hours. CBC:  Recent Labs Lab 05/17/14 1136 05/18/14 0525 05/19/14 0850 05/20/14 0600 05/22/14 0700  WBC 21.3* 7.8 9.5 12.2* 6.3  NEUTROABS 17.9*  --   --  10.5* 5.1  HGB  12.7 11.4* 11.4* 12.1 10.8*  HCT 38.1 36.0 36.3 38.7 32.0*  MCV 89.4 91.4 92.1 92.6 86.7  PLT 352 360 339 456* 331   Cardiac Enzymes: No results for input(s): CKTOTAL, CKMB, CKMBINDEX, TROPONINI in the last 168 hours. BNP: Invalid input(s): POCBNP CBG: No results for input(s): GLUCAP in the last 168 hours.  Recent Results (from the past 240 hour(s))  Culture, Urine     Status: None   Collection Time: 05/17/14  3:14 PM  Result Value Ref Range Status   Specimen Description URINE, CLEAN CATCH  Final   Special Requests NONE  Final   Colony Count NO GROWTH Performed at [facility]   Final   Culture NO GROWTH Performed at [facility]   Final   Report Status 05/18/2014 FINAL  Final  Culture, blood (routine x 2)     Status: None (Preliminary result)   Collection Time: 05/18/14  9:15 AM  Result Value Ref Range Status   Specimen Description BLOOD RIGHT ARM  Final   Special Requests BOTTLES DRAWN AEROBIC AND ANAEROBIC 6CC  Final   Culture   Final           BLOOD CULTURE RECEIVED  NO GROWTH TO DATE CULTURE WILL BE HELD FOR 5 DAYS BEFORE ISSUING A FINAL NEGATIVE REPORT Performed at [facility]    Report Status PENDING  Incomplete  Culture, blood (routine x 2)     Status: None (Preliminary result)   Collection Time: 05/18/14  9:25 AM  Result Value Ref Range Status   Specimen Description BLOOD RIGHT HAND  Final   Special Requests BOTTLES DRAWN AEROBIC ONLY 4CC  Final   Culture   Final           BLOOD CULTURE RECEIVED NO GROWTH TO DATE CULTURE WILL BE HELD FOR 5 DAYS BEFORE ISSUING A FINAL NEGATIVE REPORT Performed at [facility]    Report Status PENDING  Incomplete     Studies:              All Imaging reviewed and is as per above notation   Scheduled Meds: . calcium carbonate  1,000 mg of elemental calcium Oral TID WC  . carvedilol  6.25 mg Oral BID WC  . gabapentin  600 mg Oral TID  . heparin subcutaneous  5,000 Units Subcutaneous 3 times per day  . tiotropium  18 mcg Inhalation Daily  . Vitamin D (Ergocalciferol)  50,000 Units Oral Q7 days  . zolpidem  5 mg Oral QHS   Continuous Infusions:     Assessment/Plan:  Abdominal pain/Nausea/VomSmall Bowel Obstruction -CT of the abdomen: Stable intra-and extrahepatic biliary dilatation, without definite cause of obstruction. Severe proximal small bowel dilatation without definite transition zone --Now passing stool -likely d/c home am if tolerating full diet  Essential hypertension -home dose [email or web address] on hold-I've restarted on 3/4 her Coreg 6.25   History of coronary artery disease -Continue Coreg as above -Of note, patient states that she cannot take aspirin or any type of blood thinners due to history of bleeding  Leukocytosis -Likely reactive, appears to have resolved -Was 21.3 upon and had trended down however was 12 3/3  Asymptomatic bacteriuria -UA: WBC 7-10, negative nitrites, small leukocytes  History of systolic and diastolic heart failure-euvolemic -Echocardiogram in  January 2012 showed a grade 1 diastolic dysfunction with an EF of 45-50% -Continue to monitor daily weights, intake and output -Echocardiogram 3/1 = grade 1 diastolic dysfunction EF 47% PA peak  30 mmHg -Lasix held since admit  History of COPD, oxygen dependent -Currently euvolemic and stable -Continue nasal cannula, 2 L -Patient has no wheezing or complaints of shortness of breath -Continue albuterol as well as Spiriva  Hypocalcemia with metabolic acidosis secondary to GI losses -Patient was given IV calcium gluconate however her calcium failed to increase.  - ? IV infusion of elemental calcium -ionized calcium improved -I discussed case with nephrology on 05/20/58 -given bicarbonate to correct the acidosis--as her acidosis corrected rapidly we discontinued her bicarbonate on 3/4 -Vitamin D level was very low at 4 therefore we will supplement with 50,000 units of vitamin D every 7 daily and this is likely etiology - i suspect this is all 2/2 to Vit D deficiency-She will need I PTH and further work-up as OP and would refer as OP to endocrinology  Recent fall -Patient presented to emergency department on 05/15/2014 after sustaining a fall, x-ray showed a right 10th rib fracture -Patient also experienced a third middle phalanx dislocation with open fracture -Dr. Burney Gauze recommended ER reduction with washout will need follow-up as an outpatient with them  restarted gabapentin at a lower dose of 3 times a day -right hand appears to be stable, currently has finger splint -Wound care consulted.  She has a headache so we will start Fioricet  25 min   Verneita Griffes, MD  [facility] Pager [phone] 05/22/2014, 4:42 PM    LOS: 5 days

## 2014-05-22 NOTE — Progress Notes (Signed)
Received report at [place]. I agree with this shift assessment. Will continue to monitor pt and intervene appropriately.

## 2014-05-22 NOTE — Evaluation (Signed)
Physical Therapy Evaluation Patient Details Name: [name] MRN: [id number] DOB: [birth year] Today's Date: 05/22/2014   History of Present Illness  69 yo female adm with  Recurrent SBO, recent fall with finger fx and 10th rib fx;- PMHx: S/p colectomy 2001, Small bowel resection 2003, gastrostomy tube 2011, hx of hysterectomy, appendectomy, HTN, chronic pain/fibromyalgia;  Clinical Impression  Patient evaluated by Physical Therapy with no further acute PT needs identified. All education has been completed and the patient has no further questions.  See below for any follow-up Physial Therapy or equipment needs. PT is signing off. Thank you for this referral.  Pt had recent fall at home but does not want any f/u PT.     Follow Up Recommendations No PT follow up (pt does not want f/u)    Equipment Recommendations  None recommended by PT (pt's husband is looking for her RW at home)    Recommendations for Other Services       Precautions / Restrictions Precautions Precaution Comments: O2 dependent Restrictions Weight Bearing Restrictions: No      Mobility  Bed Mobility Overal bed mobility: Modified Independent                Transfers Overall transfer level: Modified independent Equipment used: Rolling walker (2 wheeled)                Ambulation/Gait Ambulation/Gait assistance: Supervision Ambulation Distance (Feet): 200 Feet Assistive device: Rolling walker (2 wheeled) Gait Pattern/deviations: Step-through pattern;Trunk flexed     General Gait Details: supervision for safety and management of O2 tank; mild DOE, sats 98% on 2L continuous O2, HR 108  Stairs            Wheelchair Mobility    Modified Rankin (Stroke Patients Only)       Balance Overall balance assessment: Needs assistance Sitting-balance support: No upper extremity supported;Feet supported Sitting balance-Leahy Scale: Normal     Standing balance support: During functional  activity;No upper extremity supported;Bilateral upper extremity supported Standing balance-Leahy Scale: Good Standing balance comment: recent fall in the house after her shoe caught on threshold                             Pertinent Vitals/Pain Pain Assessment: No/denies pain    Home Living   Living Arrangements: Spouse/significant other               Additional Comments: teenage children at home    Prior Function Level of Independence: Independent               Hand Dominance        Extremity/Trunk Assessment   Upper Extremity Assessment: Overall WFL for tasks assessed           Lower Extremity Assessment: Overall WFL for tasks assessed      Cervical / Trunk Assessment: Kyphotic (mildly, shoulders elevated)  Communication   Communication: No difficulties  Cognition Arousal/Alertness: Awake/alert Behavior During Therapy: WFL for tasks assessed/performed Overall Cognitive Status: Within Functional Limits for tasks assessed                      General Comments      Exercises        Assessment/Plan    PT Assessment Patent does not need any further PT services  PT Diagnosis Difficulty walking   PT Problem List    PT Treatment Interventions     PT  Goals (Current goals can be found in the Care Plan section) Acute Rehab PT Goals PT Goal Formulation: All assessment and education complete, DC therapy    Frequency     Barriers to discharge        Co-evaluation               End of Session Equipment Utilized During Treatment: Gait belt Activity Tolerance: Patient tolerated treatment well Patient left: in chair;with call bell/phone within reach           Time: 1050-1103 PT Time Calculation (min) (ACUTE ONLY): 13 min   Charges:   PT Evaluation $Initial PT Evaluation Tier I: 1 Procedure     PT G CodesKenyon Ross 2014-05-29, 11:33 AM

## 2014-05-22 NOTE — Progress Notes (Signed)
General Surgery Note  LOS: 5 days  POD -     Assessment/Plan: 1.  Recurrent SBO - S/p colectomy 2001, Small bowel resection 2003, Gastrocutaneous fistula closure, exploratory lap, lysis of adhesions, gastrostomy tube 2011, hx of hysterectomy, appendectomy  Doing well  Advance to reg diet - home later today or tomorrow?  2.  COPD, home O2 dependant 24/7. 3.  CAD/AF/PTVP/Hx of CHF/Cardiomyopathy 4.  GERD 5.  Chronic pain/fibromyalgia 6.  Gastroparesis/hx of GI bleed on Pradaxa 7.  Hypertension 8.  Hx of diverticulitis 9.  Remote hx of hepatitis  10.  DVT prophylaxis: SCD/Heparin    Principal Problem:   SBO (small bowel obstruction), recurrent Active Problems:   Essential hypertension   SYSTOLIC HEART FAILURE, CHRONIC   COPD with emphysema   Abdominal pain   Subjective:  Doing well.  No nausea, passing flatus. Objective:   Filed Vitals:   05/22/14 0630  BP: 144/68  Pulse: 92  Temp: 98.4 F (36.9 C)  Resp: 18     Intake/Output from previous day:  03/04 0701 - 03/05 0700 In: 720 [P.O.:720] Out: -   Intake/Output this shift:  Total I/O In: 160 [P.O.:160] Out: -    Physical Exam:   General: Thin older WF who is alert and oriented.    HEENT: Normal. Pupils equal. .   Lungs: Clear   Abdomen: soft.  BS present.  Multiple abdominal scars.   Lab Results:    Recent Labs  05/20/14 0600 05/22/14 0700  WBC 12.2* 6.3  HGB 12.1 10.8*  HCT 38.7 32.0*  PLT 456* 331    BMET   Recent Labs  05/21/14 0530 05/22/14 0539  NA 138 135  K 3.8 4.2  CL 105 101  CO2 21 27  GLUCOSE 90 87  BUN <5* <5*  CREATININE 0.57 0.40*  CALCIUM 9.0 7.6*    PT/INR   Recent Labs  05/20/14 0832  LABPROT 14.5  INR 1.12    ABG  No results for input(s): PHART, HCO3 in the last 72 hours.  Invalid input(s): PCO2, PO2   Studies/Results:  Dg Abd 2 Views  05/20/2014   CLINICAL DATA:  Mid abdominal discomfort, nausea  EXAM: ABDOMEN - 2 VIEW  COMPARISON:  05/18/2014  FINDINGS:  Enteric tube in the distal stomach.  Nonobstructive bowel gas pattern.  Contrast within the cecum, appendix, and likely sigmoid colon/rectum.  Sacral stimulator.  IMPRESSION: Enteric tube in the distal stomach.  Nonobstructive bowel gas pattern.  Contrast within the colon, as above.   Electronically Signed   By: Julian Hy M.D.   On: 05/20/2014 11:30     Anti-infectives:   Anti-infectives    None      Alphonsa Overall, MD, FACS Pager: [phone] Surgery Office: [phone] 05/22/2014

## 2014-05-23 LAB — BASIC METABOLIC PANEL
Anion gap: 4 — ABNORMAL LOW (ref 5–15)
BUN: 6 mg/dL (ref 6–23)
CO2: 29 mmol/L (ref 19–32)
Calcium: 8.4 mg/dL (ref 8.4–10.5)
Chloride: 100 mmol/L (ref 96–112)
Creatinine, Ser: 0.49 mg/dL — ABNORMAL LOW (ref 0.50–1.10)
GFR calc Af Amer: >90 mL/min (ref >90)
GFR calc non Af Amer: >90 mL/min (ref >90)
Glucose, Bld: 116 mg/dL — ABNORMAL HIGH (ref 70–99)
Potassium: 3.8 mmol/L (ref 3.5–5.1)
Sodium: 133 mmol/L — ABNORMAL LOW (ref 135–145)

## 2014-05-23 MED ORDER — BUTALBITAL-APAP-CAFFEINE 50-325-40 MG PO TABS: 1.0000 | ORAL_TABLET | ORAL | Status: DC | PRN

## 2014-05-23 MED ORDER — CALCIUM CARBONATE 1250 (500 CA) MG PO TABS: 2.0000 | ORAL_TABLET | Freq: Three times a day (TID) | ORAL | Status: DC

## 2014-05-23 MED ORDER — VITAMIN D (ERGOCALCIFEROL) 1.25 MG (50000 UNIT) PO CAPS: 50000.0000 [IU] | ORAL_CAPSULE | ORAL | Status: DC

## 2014-05-23 NOTE — Progress Notes (Signed)
General Surgery Note  LOS: 6 days  POD -     Assessment/Plan: 1.  Recurrent SBO - S/p colectomy 2001, Small bowel resection 2003, Gastrocutaneous fistula closure, exploratory lap, lysis of adhesions, gastrostomy tube 2011, hx of hysterectomy, appendectomy  Doing well  She should go home today.  2.  COPD, home O2 dependant 24/7. 3.  CAD/AF/PTVP/Hx of CHF/Cardiomyopathy 4.  GERD 5.  Chronic pain/fibromyalgia 6.  Gastroparesis/hx of GI bleed on Pradaxa 7.  Hypertension 8.  Hx of diverticulitis 9.  Remote hx of hepatitis  10.  DVT prophylaxis: SCD/Heparin 11.  She feel and broke a right rib, injured her left hand, and scratched up her left wrist about one week ago when working with flowers.    Principal Problem:   SBO (small bowel obstruction), recurrent Active Problems:   Essential hypertension   SYSTOLIC HEART FAILURE, CHRONIC   COPD with emphysema   Abdominal pain  Subjective:  Doing well.  No nausea, passing flatus. Objective:   Filed Vitals:   05/23/14 0453  BP: 124/72  Pulse: 82  Temp: 98.1 F (36.7 C)  Resp: 18     Intake/Output from previous day:  03/05 0701 - 03/06 0700 In: 160 [P.O.:160] Out: -   Intake/Output this shift:      Physical Exam:   General: Thin older WF who is alert and oriented.    HEENT: Normal. Pupils equal. .   Lungs: Clear   Abdomen: soft.  BS present.  Multiple abdominal scars.   Lab Results:     Recent Labs  05/22/14 0700  WBC 6.3  HGB 10.8*  HCT 32.0*  PLT 331    BMET    Recent Labs  05/22/14 0539 05/23/14 0500  NA 135 133*  K 4.2 3.8  CL 101 100  CO2 27 29  GLUCOSE 87 116*  BUN <5* 6  CREATININE 0.40* 0.49*  CALCIUM 7.6* 8.4    PT/INR  No results for input(s): LABPROT, INR in the last 72 hours.  ABG  No results for input(s): PHART, HCO3 in the last 72 hours.  Invalid input(s): PCO2, PO2   Studies/Results:  No results found.   Anti-infectives:   Anti-infectives    None      Alphonsa Overall, MD,  FACS Pager: [phone] Surgery Office: [phone] 05/23/2014

## 2014-05-23 NOTE — Discharge Summary (Signed)
Physician Discharge Summary  [name] XIP:[id number] DOB: 1946/03/18 DOA: 05/17/2014  PCP: Garnet Koyanagi, DO  Admit date: 05/17/2014 Discharge date: 05/23/2014  Time spent: 35 minutes  Recommendations for Outpatient Follow-up:  1. Patient to have basic metabolic panel, calcium and vitamin D level drawn probably in about 3 weeks-she was severely hypocalcemic during this hospital stay 2. Would recommend interval follow-up with cardiology for her cardiomyopathy/paroxysmal atrial fibrillation-she is not a candidate for any type of antiplatelet/anticoagulation 3. Continue home O2 for COPD 4. Would recommend outpatient pain management per primary care [place] physician  Discharge Diagnoses:  Principal Problem:   SBO (small bowel obstruction), recurrent Active Problems:   Essential hypertension   SYSTOLIC HEART FAILURE, CHRONIC   COPD with emphysema   Abdominal pain   Discharge Condition: Improved  Wound care recommendations application of betadine ointment to these areas once daily for three days ending on 3/4  Diet recommendation:  Initially soft diet-subsequently will need to graduate slowly to regular diet  Filed Weights   05/20/14 0624 05/22/14 0500 05/23/14 0453  Weight: 45.1 kg (99 lb 6.8 oz) 45.1 kg (99 lb 6.8 oz) 43.6 kg (96 lb 1.9 oz)    68 ? h/o Chronic pain syndrome,COPD/emphysema [PFTs 06/2008 FEV 170%],prior history solid dysphagia status post endoscopy and savory dilation 02/02/13, chronic hypoxic respiratory failure Osteoporosis,  SBO 2/2e abdominal surgeries with unsuccessful lysis of adhesions 05/2009, history of nonischemic cardiomyopathy/A. 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 known history benign stricture of distal ileum  Initially presented on 05/15/14 with a fall while ambulating hit  the right side of her head chest and had left middle finger pain-the area on the finger was washed out and patient was discharged on Keflex and patient was given pain medications on discharge from the ED Patient subsequently represented to the hospital on 05/17/58 with abdominal pain nausea and vomiting found to have small bowel obstruction   Abdominal pain/Nausea/VomSmall Bowel Obstruction -CT initially on admissionSevere proximal small bowel dilatation without definite transition zone --Now passing stool -General surgery has cleared her for discharge home 3/6  Essential hypertension -home dose Lasix40 on hold during hospital stay-I've restarted on 3/4 her Coreg 6.25 on hold at this time  History of coronary artery disease, CHF, P A. fib not on anticoagulation -Continue Coreg as above -Of note, patient states that she cannot take aspirin or any type of blood thinners due to history of bleeding  Leukocytosis -Likely reactive, appears to have resolved -Was 21.3 upon and had trended down however was 12 3/3  Asymptomatic bacteriuria -UA: WBC 7-10, negative nitrites, small leukocytes  History of systolic and diastolic heart failure-euvolemic -Echocardiogram in January 2012 showed a grade 1 diastolic dysfunction with an EF of 45-50% -Echocardiogram 3/1 = grade 1 diastolic dysfunction EF 67% PA peak 30 mmHg -Lasix 40 restarted on discharge  History of COPD, oxygen dependent -Currently euvolemic and stable -Continue nasal cannula, 2 L when discharged home -Patient has no wheezing or complaints of shortness of breath -Continue albuterol as well as Spiriva  Hypocalcemia with metabolic acidosis secondary to GI losses -Patient was given IV calcium gluconate however her calcium failed to increase.  -Calcium 5.5 05/19/14 ? IV infusion of elemental calcium -ionized calcium improved to 0.87 Replacing Mag to goal, Rpt Bmet + ionized calcium -I discussed case with nephrology on 05/20/58-we will need  to check her  ionized calcium closely -Patient will also need bicarbonate which I started as D5 with 100 mEq to correct the acidosis--as her acidosis corrected rapidly we discontinued her bicarbonate on 3/4 -Vitamin D level was very low at 4 therefore we will supplement with 50,000 units of vitamin D every 7 daily and this is likely etiology -She may benefit from endocrinology input as an outpatient-her calcium on discharge was within normal limits on 3/6  Recent fall -Patient presented to emergency department on 05/15/2014 after sustaining a fall, x-ray showed a right 10th rib fracture -Patient also experienced a third middle phalanx dislocation with open fracture -Dr. Burney Gauze recommended ER reduction with washout will need follow-up-patient prefers to go and see Dr. Renard Hamper as an outpatient  restarted gabapentin at a lower dose of 3 times a day -right hand appears to be stable, currently has finger splint -Wound care consulted-recommendations as above  She has a headache so we will  Consultants:  General surgery  Procedures:  X-rays  Antibiotics:  none     Discharge Exam: Filed Vitals:   05/23/14 0453  BP: 124/72  Pulse: 82  Temp: 98.1 F (36.7 C)  Resp: 18    alert pleasant oriented hungry   General  EOMI NCAT frail  Cardiovascular:  S1-S2 no murmur Respiratory: No wheeze no rhonchi left third finger buddy taped, not examined moderate ROM  Discharge Instructions   Discharge Instructions    Diet - low sodium heart healthy    Complete by:  As directed      Discharge instructions    Complete by:  As directed   You will need to take calcium and vitamin D at the doses we have given U 4 a couple of weeks or months-your primary care physician should check her labs I would recommend close follow-up with Dr. Jean Rosenthal of orthopedics given your history of left hand injury Please use your incentive spirometer every 30 minutes to an hour to prevent worsening of  any lung condition.  I would recommend interval follow-up with your cardiologist     Increase activity slowly    Complete by:  As directed           Current Discharge Medication List    START taking these medications   Details  butalbital-acetaminophen-caffeine (FIORICET, ESGIC) 50-325-40 MG per tablet Take 1 tablet by mouth every 4 (four) hours as needed for headache. Qty: 14 tablet, Refills: 0    calcium carbonate (OS-CAL - DOSED IN MG OF ELEMENTAL CALCIUM) 1250 (500 CA) MG tablet Take 2 tablets (1,000 mg of elemental calcium total) by mouth 3 (three) times daily with meals. Qty: 180 tablet, Refills: 0    Vitamin D, Ergocalciferol, (DRISDOL) 50000 UNITS CAPS capsule Take 1 capsule (50,000 Units total) by mouth every 7 (seven) days. Qty: 10 capsule, Refills: 0      CONTINUE these medications which have NOT CHANGED   Details  albuterol (PROAIR HFA) 108 (90 BASE) MCG/ACT inhaler Inhale 2 puffs into the lungs every 6 (six) hours as needed for wheezing or shortness of breath. Qty: 1 Inhaler, Refills: 3    AMBULATORY NON FORMULARY MEDICATION Pt takes Oxygen, 2 units, every day    carvedilol (COREG) 6.25 MG tablet TAKE ONE TABLET BY MOUTH TWICE DAILY WITH MEALS Qty: 60 tablet, Refills: 0    furosemide (LASIX) 40 MG tablet TAKE ONE TABLET BY MOUTH IN THE MORNING Qty: 90 tablet, Refills: 3    gabapentin (NEURONTIN) 600 MG tablet TAKE ONE TABLET  BY MOUTH 4 TIMES DAILY Qty: 120 tablet, Refills: 11   Associated Diagnoses: Chronic pain syndrome    loperamide (IMODIUM A-D) 2 MG tablet Take 1/2 tablet by mouth daily as needed for diarrhea. Qty: 30 tablet, Refills: 2    MYRBETRIQ 25 MG TB24 tablet Take 1 tablet by mouth daily.    nitroGLYCERIN (NITROSTAT) 0.4 MG SL tablet Place 0.4 mg under the tongue every 5 (five) minutes as needed. CHEST PAIN    oxyCODONE-acetaminophen (PERCOCET) 10-325 MG per tablet Take 1 tablet by mouth every 4 (four) hours as needed. PAIN Qty: 120 tablet,  Refills: 0   Associated Diagnoses: Pain    pantoprazole (PROTONIX) 40 MG tablet TAKE ONE TABLET BY MOUTH TWICE DAILY Qty: 60 tablet, Refills: 1    promethazine (PHENERGAN) 25 MG tablet Take 1 tablet (25 mg total) by mouth every 8 (eight) hours as needed for nausea. Qty: 40 tablet, Refills: 0    tiZANidine (ZANAFLEX) 4 MG tablet Take 1 tablet (4 mg total) by mouth 3 (three) times daily as needed for muscle spasms. Qty: 90 tablet, Refills: 0    zolpidem (AMBIEN) 5 MG tablet TAKE ONE TABLET BY MOUTH AT BEDTIME AS NEEDED FOR SLEEP Qty: 30 tablet, Refills: 0    tiotropium (SPIRIVA HANDIHALER) 18 MCG inhalation capsule Place 1 capsule (18 mcg total) into inhaler and inhale every morning. Qty: 30 capsule, Refills: 5      STOP taking these medications     cephALEXin (KEFLEX) 500 MG capsule      HYDROcodone-acetaminophen (NORCO/VICODIN) 5-325 MG per tablet        Allergies  Allergen Reactions  . Dabigatran Etexilate Mesylate Other (See Comments)    INTERNAL BLEEDING-pradaxa  . Talwin [Pentazocine] Other (See Comments)    hallucinations  . Pentazocine Lactate Other (See Comments)    HALLUCINATIONS  . Sulfonamide Derivatives Other (See Comments)    JITTERINESS       The results of significant diagnostics from this hospitalization (including imaging, microbiology, ancillary and laboratory) are listed below for reference.    Significant Diagnostic Studies: Dg Ribs Unilateral W/chest Right  05/15/2014   CLINICAL DATA:  Acute right rib pain after falling at home. Initial encounter.  EXAM: RIGHT RIBS AND CHEST - 3+ VIEW  COMPARISON:  Jul 30, 2011.  FINDINGS: Minimally displaced fracture seen involving the anterior portion of the right tenth rib. There is no evidence of pneumothorax or pleural effusion. Both lungs are clear. Left-sided pacemaker is unchanged in position. Heart size and mediastinal contours are within normal limits.  IMPRESSION: Minimally displaced right tenth rib  fracture. No acute cardiopulmonary abnormality seen.   Electronically Signed   By: Marijo Conception, M.D.   On: 05/15/2014 15:37   Ct Abdomen Pelvis W Contrast  05/17/2014   CLINICAL DATA:  Generalized abdominal pain.  EXAM: CT ABDOMEN AND PELVIS WITH CONTRAST  TECHNIQUE: Multidetector CT imaging of the abdomen and pelvis was performed using the standard protocol following bolus administration of intravenous contrast.  CONTRAST:  22mL OMNIPAQUE IOHEXOL 300 MG/ML  SOLN  COMPARISON:  CT scan of December 11, 2011.  FINDINGS: Moderate degenerative disc disease is noted at L4-5 with grade 1 anterolisthesis secondary to posterior facet joint hypertrophy. Status post right total hip arthroplasty. Visualized lung bases appear normal.  No gallstones are noted. Stable intrahepatic and extrahepatic biliary dilatation is noted without definite cause of obstruction. The spleen and pancreas appear normal. Adrenal glands and kidneys appear normal without hydronephrosis or renal obstruction.  There remains severe proximal small bowel dilatation concerning for distal small bowel obstruction, although definite transition zone is not identified. Stool is noted in the sigmoid colon. Atherosclerotic calcifications of abdominal aorta are noted without aneurysm formation. No abnormal fluid collection is noted. Urinary bladder appears normal. Status post hysterectomy.  IMPRESSION: Stable intrahepatic and extrahepatic biliary dilatation is noted without definite cause of obstruction.  Severe proximal small bowel dilatation is noted without definite transition zone identified. This is concerning for partial distal small bowel obstruction.   Electronically Signed   By: Marijo Conception, M.D.   On: 05/17/2014 14:15   Dg Hand 2 View Left  05/15/2014   CLINICAL DATA:  Postreduction left middle finger.  EXAM: LEFT HAND - 2 VIEW  COMPARISON:  05/15/2014  FINDINGS: Interval relocation of the proximal interphalangeal joint of the left third  finger. There is associated soft tissue swelling. No definite fracture is appreciated. Prominent degenerative changes throughout the interphalangeal joints and in the wrist. Collapse and compression of the trapezium, likely degenerative.  IMPRESSION: Interval relocation of the proximal interphalangeal joint of the left third finger with good alignment demonstrated. Soft tissue swelling is present. Diffuse degenerative changes.   Electronically Signed   By: Lucienne Capers M.D.   On: 05/15/2014 17:38   Dg Abd 2 Views  05/20/2014   CLINICAL DATA:  Mid abdominal discomfort, nausea  EXAM: ABDOMEN - 2 VIEW  COMPARISON:  05/18/2014  FINDINGS: Enteric tube in the distal stomach.  Nonobstructive bowel gas pattern.  Contrast within the cecum, appendix, and likely sigmoid colon/rectum.  Sacral stimulator.  IMPRESSION: Enteric tube in the distal stomach.  Nonobstructive bowel gas pattern.  Contrast within the colon, as above.   Electronically Signed   By: Julian Hy M.D.   On: 05/20/2014 11:30   Dg Abd Acute W/chest  05/18/2014   CLINICAL DATA:  History of small bowel obstruction.  EXAM: ACUTE ABDOMEN SERIES (ABDOMEN 2 VIEW & CHEST 1 VIEW)  COMPARISON:  05/17/2014 and chest x-ray 07/30/2011  FINDINGS: Left-sided pacemaker unchanged. Lungs are somewhat hypoinflated with stable subtle focal interstitial density over the right upper lobe. Cardiomediastinal silhouette and remainder of the chest is unchanged.  Abdominal images demonstrate a nasogastric tube with tip over the mid abdomen right of midline likely over the distal stomach. There is a metallic device with associated small caliber catheter over the left pelvis unchanged. Segment of catheter over the right upper pelvis unchanged.  There are several scattered air-fluid levels without significant change. Air and contrast are present within the colon. There is a mildly dilated air-filled small bowel loop over the right mid abdomen. No evidence of free peritoneal  air. Remainder of the exam is unchanged.  IMPRESSION: Single air-filled mildly dilated small bowel loop over the right mid abdomen with Stable scattered air-fluid levels. No free peritoneal air. Findings may be due to early/partial small bowel obstruction.  No acute cardiopulmonary disease.  Enteric tube with tip over the mid abdomen right of midline likely within the distal stomach.   Electronically Signed   By: Marin Olp M.D.   On: 05/18/2014 13:29   Dg Abd Acute W/chest  05/17/2014   CLINICAL DATA:  Abdominal pain.  EXAM: ACUTE ABDOMEN SERIES (ABDOMEN 2 VIEW & CHEST 1 VIEW)  COMPARISON:  CT 05/17/2014.  Chest x-ray 05/15/2014, 07/30/2011.  FINDINGS: NG tube noted with tip in the stomach. Cardiac pacer with lead tips in right atrium and right ventricle. Chronic interstitial changes noted right upper  lung.  Dilated proximal small bowel loops are noted. These findings are again consistent with small-bowel obstruction. Contrast is noted kidney from prior CT. No free air. No pneumatosis. Right hip replacement. Neurostimulator.  IMPRESSION: Persistent changes of proximal small bowel obstruction. NG tube is noted with its tip projected over the stomach.   Electronically Signed   By: Marcello Moores  Register   On: 05/17/2014 17:08   Dg Hand Complete Left  05/15/2014   CLINICAL DATA:  Fall  EXAM: LEFT HAND - COMPLETE 3+ VIEW  COMPARISON:  None.  FINDINGS: Posterior dislocation of the 3rd middle phalanx at the PIP joint.  Associated mild cortical irregularity on the oblique view suggests a possible associated fracture of the base of the 3rd middle phalanx.  Additional possible fracture involving the base of the 4th middle phalanx.  Associated soft tissue swelling of the 2nd and 3rd digit.  Degenerative changes of the 1st carpometacarpal joint.  IMPRESSION: Posterior dislocation of the 3rd middle phalanx at the PIP joint, with suspected associated fracture at the base of the middle phalanx.  Possible additional fracture  involving the base of the 4th middle phalanx.   Electronically Signed   By: Julian Hy M.D.   On: 05/15/2014 13:55    Microbiology: Recent Results (from the past 240 hour(s))  Culture, Urine     Status: None   Collection Time: 05/17/14  3:14 PM  Result Value Ref Range Status   Specimen Description URINE, CLEAN CATCH  Final   Special Requests NONE  Final   Colony Count NO GROWTH Performed at [facility]   Final   Culture NO GROWTH Performed at [facility]   Final   Report Status 05/18/2014 FINAL  Final  Culture, blood (routine x 2)     Status: None (Preliminary result)   Collection Time: 05/18/14  9:15 AM  Result Value Ref Range Status   Specimen Description BLOOD RIGHT ARM  Final   Special Requests BOTTLES DRAWN AEROBIC AND ANAEROBIC 6CC  Final   Culture   Final           BLOOD CULTURE RECEIVED NO GROWTH TO DATE CULTURE WILL BE HELD FOR 5 DAYS BEFORE ISSUING A FINAL NEGATIVE REPORT Performed at [facility]    Report Status PENDING  Incomplete  Culture, blood (routine x 2)     Status: None (Preliminary result)   Collection Time: 05/18/14  9:25 AM  Result Value Ref Range Status   Specimen Description BLOOD RIGHT HAND  Final   Special Requests BOTTLES DRAWN AEROBIC ONLY 4CC  Final   Culture   Final           BLOOD CULTURE RECEIVED NO GROWTH TO DATE CULTURE WILL BE HELD FOR 5 DAYS BEFORE ISSUING A FINAL NEGATIVE REPORT Performed at [facility]    Report Status PENDING  Incomplete     Labs: Basic Metabolic Panel:  Recent Labs Lab 05/17/14 1136  05/19/14 1439 05/20/14 0600 05/20/14 1533 05/21/14 0530 05/22/14 0539 05/23/14 0500  NA 138  < >  --  140 139 138 135 133*  K 5.0  < >  --  4.9 5.1 3.8 4.2 3.8  CL 104  < >  --  113* 113* 105 101 100  CO2 26  < >  --  13* 15* 21 27 29   GLUCOSE 133*  < >  --  49* 49* 90 87 116*  BUN 28*  < >  --  6  5* <5* <5* 6  CREATININE 0.87  < >  --  0.54 0.54 0.57 0.40* 0.49*  CALCIUM 5.6*  < >   --  7.9* 8.4 9.0 7.6* 8.4  MG 2.0  --  1.9  --   --   --   --   --   PHOS 3.0  --   --   --   --   --   --   --   < > = values in this interval not displayed. Liver Function Tests:  Recent Labs Lab 05/17/14 1136 05/20/14 0600  AST 24 16  ALT 21 14  ALKPHOS 94 89  BILITOT 0.3 1.1  PROT 6.4 6.4  ALBUMIN 3.3* 3.2*   No results for input(s): LIPASE, AMYLASE in the last 168 hours. No results for input(s): AMMONIA in the last 168 hours. CBC:  Recent Labs Lab 05/17/14 1136 05/18/14 0525 05/19/14 0850 05/20/14 0600 05/22/14 0700  WBC 21.3* 7.8 9.5 12.2* 6.3  NEUTROABS 17.9*  --   --  10.5* 5.1  HGB 12.7 11.4* 11.4* 12.1 10.8*  HCT 38.1 36.0 36.3 38.7 32.0*  MCV 89.4 91.4 92.1 92.6 86.7  PLT 352 360 339 456* 331   Cardiac Enzymes: No results for input(s): CKTOTAL, CKMB, CKMBINDEX, TROPONINI in the last 168 hours. BNP: BNP (last 3 results) No results for input(s): BNP in the last 8760 hours.  ProBNP (last 3 results) No results for input(s): PROBNP in the last 8760 hours.  CBG: No results for input(s): GLUCAP in the last 168 hours.     SignedNita Sells  [facility] 05/23/2014, 11:15 AM

## 2014-05-24 ENCOUNTER — Telehealth: Payer: Self-pay

## 2014-05-24 LAB — CULTURE, BLOOD (ROUTINE X 2)
Culture: NO GROWTH
Culture: NO GROWTH

## 2014-05-24 NOTE — Telephone Encounter (Signed)
Admit date: 05/17/2014 Discharge date: 05/23/2014  Reason for admission:  SBO  Recommendations for Outpatient Follow-up:  1. Patient to have basic metabolic panel, calcium and vitamin D level drawn probably in about 3 weeks-she was severely hypocalcemic during this hospital stay 2. Would recommend interval follow-up with cardiology for her cardiomyopathy/paroxysmal atrial fibrillation-she is not a candidate for any type of antiplatelet/anticoagulation 3. Continue home O2 for COPD 4. Would recommend outpatient pain management per primary care physician/pain physician  Transition Care Management Follow-up Telephone Call  How have you been since you were released from the hospital? Pt states she is "as weak as water."  Over the past 3 weeks, pt has had stomach flu, has fractured a rib and broken 2 fingers, and has had a small bowel obstruction.  She has lost 8-9lbs.  Experiencing pain (rib and fingers).  Rated 5/10. Taking oxycodone.  Med is therapeutic.  Becomes short of breath with exertion.  SOB relieves with rest.  Wearing O2 at 2 L [place].  Walking with walker.  Eating and tolerating a regular diet without nausea and vomiting.  BM regular and normal.  Last BM: yesterday.      Do you understand why you were in the hospital? yes   Do you understand the discharge instructions? yes  Items Reviewed:  Medications reviewed: yes  Allergies reviewed: yes  Dietary changes reviewed: yes  Referrals reviewed: yes   Functional Questionnaire:  Activities of Daily Living (ADLs):    Family assists with ADLs.     Any transportation issues/concerns?: no   Any patient concerns? yes, pt would like a refill on her Ambien and oxycodone.     Confirmed importance and date/time of follow-up visits scheduled: yes   Confirmed with patient if condition begins to worsen call PCP or go to the ER: yes  Pt states she will be out of town all next week and will not be able to come in until after March  21st.  Appointment was scheduled for March 24th @ 11:30 am with Dr. Etter Sjogren per patient's request.

## 2014-05-25 LAB — VITAMIN D 1,25 DIHYDROXY
Vitamin D 1, 25 (OH)2 Total: 61 pg/mL
Vitamin D2 1, 25 (OH)2: <10 pg/mL
Vitamin D3 1, 25 (OH)2: 60 pg/mL

## 2014-05-27 ENCOUNTER — Ambulatory Visit (INDEPENDENT_AMBULATORY_CARE_PROVIDER_SITE_OTHER): Payer: PPO | Admitting: Medical

## 2014-05-27 ENCOUNTER — Encounter: Payer: Self-pay | Admitting: Medical

## 2014-05-27 VITALS — BP 106/60 | HR 68 | Temp 97.7°F | Ht 60.5 in | Wt 94.4 lb

## 2014-05-27 DIAGNOSIS — L089 Local infection of the skin and subcutaneous tissue, unspecified: Secondary | ICD-10-CM

## 2014-05-27 DIAGNOSIS — G4700 Insomnia, unspecified: Secondary | ICD-10-CM | POA: Diagnosis not present

## 2014-05-27 DIAGNOSIS — D6489 Other specified anemias: Secondary | ICD-10-CM

## 2014-05-27 DIAGNOSIS — E871 Hypo-osmolality and hyponatremia: Secondary | ICD-10-CM

## 2014-05-27 DIAGNOSIS — K5669 Other intestinal obstruction: Secondary | ICD-10-CM | POA: Diagnosis not present

## 2014-05-27 DIAGNOSIS — K3184 Gastroparesis: Secondary | ICD-10-CM

## 2014-05-27 DIAGNOSIS — K56609 Unspecified intestinal obstruction, unspecified as to partial versus complete obstruction: Secondary | ICD-10-CM

## 2014-05-27 MED ORDER — ZOLPIDEM TARTRATE 5 MG PO TABS: 5.0000 mg | ORAL_TABLET | Freq: Every evening | ORAL | Status: DC | PRN

## 2014-05-27 MED ORDER — DOXYCYCLINE HYCLATE 100 MG PO TABS: 100.0000 mg | ORAL_TABLET | Freq: Two times a day (BID) | ORAL | Status: DC

## 2014-05-27 MED ORDER — MUPIROCIN 2 % EX OINT: TOPICAL_OINTMENT | CUTANEOUS | Status: DC

## 2014-05-27 NOTE — Assessment & Plan Note (Signed)
Refill ambien for one month. Further refill to be determined by Dr. Etter Sjogren.

## 2014-05-27 NOTE — Progress Notes (Signed)
Pre visit review using our clinic review tool, if applicable. No additional management support is needed unless otherwise documented below in the visit note. 

## 2014-05-27 NOTE — Assessment & Plan Note (Signed)
Mupirocin ointment twice daily and doxycycline 1 tab po bid x 7 days. Rx advisement.   If signs or symptoms worsen or change after hours then be seen in [place].

## 2014-05-27 NOTE — Patient Instructions (Addendum)
Skin infection Mupirocin ointment twice daily and doxycycline 1 tab po bid x 7 days. Rx advisement.   If signs or symptoms worsen or change after hours then be seen in [place].   Insomnia Refill ambien for one month. Further refill to be determined by Dr. Etter Sjogren.   Small bowel obstruction Resolved now. Normal bm daily and abd exam negative. I did see on her labs that were done on discharge low na, low ca and mild anemia. I saw this after she left. So I called her and asked her to come in tomorrow for labs. That way will assess before she leaves out of town on Monday.    Follow up with Dr. Etter Sjogren as regularly scheduled or as needed

## 2014-05-27 NOTE — Assessment & Plan Note (Signed)
Resolved now. Normal bm daily and abd exam negative. I did see on her labs that were done on discharge low na, low ca and mild anemia. I saw this after she left. So I called her and asked her to come in tomorrow for labs. That way will assess before she leaves out of town on Monday.

## 2014-05-27 NOTE — Progress Notes (Signed)
Subjective:    Patient ID: [name], female    DOB: 06/08/1945, 69 y.o.   MRN: [id number]  HPI    Pt in for mild red area rt nares. This came up yesterday. Hx of cellulitis around the nose. Pt is about to head out of town tomorrow. No fevers and no chills.   Pt did toward end of the exam/interview mentioned that she was hosptialized recently for possible SBO. She was treated and discarged on Sunday. No GI signs or symptoms since. Eating well. Daily on BM q day. No nausea, no vomiting, no fever. Good energy.      Review of Systems  Constitutional: Negative for fever, chills and fatigue.  Respiratory: Negative for cough, chest tightness, shortness of breath and wheezing.   Cardiovascular: Negative for chest pain and palpitations.  Gastrointestinal: Negative for nausea, vomiting, abdominal pain, diarrhea, constipation, blood in stool, abdominal distention, anal bleeding and rectal pain.       Daily bm since Monday.   Musculoskeletal: Negative for back pain.  Skin:       Pt rt nares mild red. Near tip of the nose,  Neurological: Negative for dizziness, weakness, numbness and headaches.  Hematological: Negative for adenopathy. Does not bruise/bleed easily.  Psychiatric/Behavioral: Negative for behavioral problems and confusion.   Past Medical History  Diagnosis Date  . GERD (gastroesophageal reflux disease)   . CAD (coronary artery disease)     mild disease per cath in 2011  . Venous embolism and thrombosis of subclavian vein     after pacemaker insertion Oct 2011  . Pleural effusion      s/p right thoracentesis 03/09  . Small bowel obstruction   . Urinary incontinence      -INTERSTIM IMPLANT NOT FUNCTIONING PER PT  . RLS (restless legs syndrome)   . Primary dilated cardiomyopathy     EF 45 to 50% per echo in Jan 2012  . Vitamin B12 deficiency   . Gastroparesis   . Chronic nausea   . Chronic abdominal pain   . Chronic pain syndrome   . Pacemaker     CRT therapy;  followed by Dr. Caryl Comes  . H/O: GI bleed     from Pradaxa  . Oxygen dependent     2 liters via nasal cannula at all times  . CHF (congestive heart failure)   . Anemia   . Hx of stress fx aug 2011    right hip   . Elbow fracture, left aug 2012  . Arthritis     right hip  . Hx of gastric ulcer   . Diverticulitis   . HTN (hypertension)     under control; has been on med. x "years"  . Hx MRSA infection   . Pneumonia - 03/09, 12/09, 02/10, 06/10    MOST RECENT FEB 2013  . Elevated liver enzymes   . Blood transfusion   . Anginal pain   . Shortness of breath   . Fibromyalgia     COUPLE OF TIMES A YEAR-PT HAS EPISODES OF CONFUSION-USUALLY INVOLVES DAY/NIGHT REVERSAL AND EPISODES OF TWITCHING AND FALLS--SEES DR. Jacelyn Grip - NEUROLOGIST-LAST SEEN 07/10/11  . LBBB (left bundle branch block)   . Atrial fibrillation   . COPD (chronic obstructive pulmonary disease)     continuous O2- 2l  . Hepatitis     hx of in high school   . Silent aspiration     History   Social History  . Marital Status: Married  Spouse Name: N/A  . Number of Children: 5  . Years of Education: N/A   Occupational History  . disabled    Social History Main Topics  . Smoking status: Former Smoker -- 1.00 packs/day for 20 years    Quit date: 03/19/1986  . Smokeless tobacco: Never Used  . Alcohol Use: No  . Drug Use: No  . Sexual Activity: Yes   Other Topics Concern  . Not on file   Social History Narrative   Occupation: Disabled   Alcohol Use - no   Illicit Drug Use - no   Patient is a former smoker, quit 1988 x46yrs, <1ppd.    Daily Caffeine Use    Past Surgical History  Procedure Laterality Date  . Appendectomy    . Pacemaker placement  12/21/2009  . Peg removed      with complications  . Tonsillectomy  age 27  . Interstim implant revision  03/06/2011    Procedure: REVISION OF [name];  Surgeon: Reece Packer, MD;  Location: [facility];  Service: Urology;  Laterality: N/A;  Replacement of  Neurostimulator  . Portacath placement  12/16/2009  . Total abdominal hysterectomy      complete  . Interstim implant placement  05/28/2006 - stage I    06/05/2006 - stage II  . Interstim implant revision  10/23/2007  . Cystoscopy with injection  04/30/2006    transurethral collagen injection; incision vaginal stenosis  . Small intestine surgery  05/20/2001    ex. lap., resection of small bowel stricture; gastrostomy; insertion central line  . Cardiac catheterization  04/08/2006, 11/16/2009  . Cystoscopy  11/11/2008  . Cystoscopy w/ retrogrades  11/11/2008    right  . Colectomy      Intestinal resection (5times)  . Colectomy  02/04/2000    ex. lap., intra-abd. subtotal colectomy with ileosigmoid colon anastomosies and lysis of adhesions  . Gastrocutaneous fistula closure    . Exploratory laparotomy  04/27/2009    lysis of adhesions, gastrostomy tube  . Port-a-cath removal  07/27/2011    Procedure: REMOVAL PORT-A-CATH;  Surgeon: Rolm Bookbinder, MD;  Location: [facility];  Service: General;  Laterality: N/A;  . Total hip arthroplasty  08/03/2011    Procedure: TOTAL HIP ARTHROPLASTY ANTERIOR APPROACH;  Surgeon: Mcarthur Rossetti, MD;  Location: [facility];  Service: Orthopedics;  Laterality: Right;  . Interstim implant placement  02/05/2012    Procedure: INTERSTIM IMPLANT FIRST STAGE;  Surgeon: Reece Packer, MD;  Location: [facility];  Service: Urology;  Laterality: N/A;  Replacement of Interstim Lead     . Esophagogastroduodenoscopy N/A 02/02/2013    Procedure: ESOPHAGOGASTRODUODENOSCOPY (EGD);  Surgeon: Lafayette Dragon, MD;  Location: [facility];  Service: Endoscopy;  Laterality: N/A;  . Savory dilation N/A 02/02/2013    Procedure: SAVORY DILATION;  Surgeon: Lafayette Dragon, MD;  Location: [facility];  Service: Endoscopy;  Laterality: N/A;    Family History  Problem Relation Age of Onset  . Heart disease Father   . Stroke Mother   . Heart disease Mother   . Colon cancer  Neg Hx   . Heart disease Brother   . Breast cancer Maternal Aunt   . Heart disease Paternal Aunt   . Diabetes Paternal Grandfather     Allergies  Allergen Reactions  . Dabigatran Etexilate Mesylate Other (See Comments)    INTERNAL BLEEDING-pradaxa  . Talwin [Pentazocine] Other (See Comments)    hallucinations  . Pentazocine Lactate Other (See Comments)  HALLUCINATIONS  . Sulfonamide Derivatives Other (See Comments)    JITTERINESS     Current Outpatient Prescriptions on File Prior to Visit  Medication Sig Dispense Refill  . albuterol (PROAIR HFA) 108 (90 BASE) MCG/ACT inhaler Inhale 2 puffs into the lungs every 6 (six) hours as needed for wheezing or shortness of breath. 1 Inhaler 3  . AMBULATORY NON FORMULARY MEDICATION Pt takes Oxygen, 2 units, every day    . calcium carbonate (OS-CAL - DOSED IN MG OF ELEMENTAL CALCIUM) 1250 (500 CA) MG tablet Take 2 tablets (1,000 mg of elemental calcium total) by mouth 3 (three) times daily with meals. 180 tablet 0  . carvedilol (COREG) 6.25 MG tablet TAKE ONE TABLET BY MOUTH TWICE DAILY WITH MEALS 60 tablet 0  . furosemide (LASIX) 40 MG tablet TAKE ONE TABLET BY MOUTH IN THE MORNING 90 tablet 3  . gabapentin (NEURONTIN) 600 MG tablet TAKE ONE TABLET BY MOUTH 4 TIMES DAILY 120 tablet 11  . loperamide (IMODIUM A-D) 2 MG tablet Take 1/2 tablet by mouth daily as needed for diarrhea. 30 tablet 2  . MYRBETRIQ 25 MG TB24 tablet Take 1 tablet by mouth daily.    . nitroGLYCERIN (NITROSTAT) 0.4 MG SL tablet Place 0.4 mg under the tongue every 5 (five) minutes as needed. CHEST PAIN    . oxyCODONE-acetaminophen (PERCOCET) 10-325 MG per tablet Take 1 tablet by mouth every 4 (four) hours as needed. PAIN 120 tablet 0  . pantoprazole (PROTONIX) 40 MG tablet TAKE ONE TABLET BY MOUTH TWICE DAILY 60 tablet 1  . promethazine (PHENERGAN) 25 MG tablet Take 1 tablet (25 mg total) by mouth every 8 (eight) hours as needed for nausea. 40 tablet 0  . tiotropium (SPIRIVA  HANDIHALER) 18 MCG inhalation capsule Place 1 capsule (18 mcg total) into inhaler and inhale every morning. (Patient taking differently: Place 18 mcg into inhaler and inhale daily as needed (shortness of breath). ) 30 capsule 5  . tiZANidine (ZANAFLEX) 4 MG tablet Take 1 tablet (4 mg total) by mouth 3 (three) times daily as needed for muscle spasms. 90 tablet 0  . Vitamin D, Ergocalciferol, (DRISDOL) 50000 UNITS CAPS capsule Take 1 capsule (50,000 Units total) by mouth every 7 (seven) days. 10 capsule 0  . zolpidem (AMBIEN) 5 MG tablet TAKE ONE TABLET BY MOUTH AT BEDTIME AS NEEDED FOR SLEEP 30 tablet 0  . [DISCONTINUED] iron dextran complex (INFED) 50 MG/ML injection Please give infed infusion (no test dose needed) over 4 hours per pharmacy calculated dose. Ht: 5'2 Wt: 99 pounds Hgb: 12 1 mL 0   No current facility-administered medications on file prior to visit.    BP 106/60 mmHg  Pulse 68  Temp(Src) 97.7 F (36.5 C) (Oral)  Ht 5' 0.5" (1.537 m)  Wt 94 lb 6.4 oz (42.82 kg)  BMI 18.13 kg/m2  SpO2 100%      Objective:   Physical Exam   General- No acute distress. Pleasant patient. Neck- Full range of motion, no jvd Lungs- Clear, even and unlabored. Heart- regular rate and rhythm. Neurologic- CNII- XII grossly intact.  Skin- rt side of nose red and tender area. Faint tenderness of rt maxillary area. Abdomen- soft, nontender, +bs, no rebound or guarding and no organomegaly.       Assessment & Plan:

## 2014-05-28 ENCOUNTER — Other Ambulatory Visit (INDEPENDENT_AMBULATORY_CARE_PROVIDER_SITE_OTHER): Payer: PPO

## 2014-05-28 DIAGNOSIS — E871 Hypo-osmolality and hyponatremia: Secondary | ICD-10-CM

## 2014-05-28 DIAGNOSIS — D6489 Other specified anemias: Secondary | ICD-10-CM

## 2014-05-28 LAB — CBC WITH DIFFERENTIAL/PLATELET
Basophils Absolute: 0.1 10*3/uL (ref 0.0–0.1)
Basophils Relative: 0.9 % (ref 0.0–3.0)
Eosinophils Absolute: 0.3 10*3/uL (ref 0.0–0.7)
Eosinophils Relative: 4.6 % (ref 0.0–5.0)
HCT: 32.4 % — ABNORMAL LOW (ref 36.0–46.0)
Hemoglobin: 10.9 g/dL — ABNORMAL LOW (ref 12.0–15.0)
Lymphocytes Relative: 10.8 % — ABNORMAL LOW (ref 12.0–46.0)
Lymphs Abs: 0.7 10*3/uL (ref 0.7–4.0)
MCHC: 33.6 g/dL (ref 30.0–36.0)
MCV: 87.9 fl (ref 78.0–100.0)
Monocytes Absolute: 0.7 10*3/uL (ref 0.1–1.0)
Monocytes Relative: 10.4 % (ref 3.0–12.0)
Neutro Abs: 4.9 10*3/uL (ref 1.4–7.7)
Neutrophils Relative %: 73.3 % (ref 43.0–77.0)
Platelets: 355 10*3/uL (ref 150.0–400.0)
RBC: 3.69 Mil/uL — ABNORMAL LOW (ref 3.87–5.11)
RDW: 14.1 % (ref 11.5–15.5)
WBC: 6.7 10*3/uL (ref 4.0–10.5)

## 2014-05-28 LAB — COMPREHENSIVE METABOLIC PANEL
ALT: 18 U/L (ref 0–35)
AST: 25 U/L (ref 0–37)
Albumin: 3.7 g/dL (ref 3.5–5.2)
Alkaline Phosphatase: 85 U/L (ref 39–117)
BUN: 18 mg/dL (ref 6–23)
CO2: 35 mEq/L — ABNORMAL HIGH (ref 19–32)
Calcium: 8.1 mg/dL — ABNORMAL LOW (ref 8.4–10.5)
Chloride: 99 mEq/L (ref 96–112)
Creatinine, Ser: 0.69 mg/dL (ref 0.40–1.20)
GFR: 89.52 mL/min (ref >60.00)
Glucose, Bld: 96 mg/dL (ref 70–99)
Potassium: 4.4 mEq/L (ref 3.5–5.1)
Sodium: 137 mEq/L (ref 135–145)
Total Bilirubin: 0.3 mg/dL (ref 0.2–1.2)
Total Protein: 6.4 g/dL (ref 6.0–8.3)

## 2014-06-03 NOTE — Telephone Encounter (Signed)
Pt came in sooner to see Mackie Pai, PA-C.

## 2014-06-08 ENCOUNTER — Other Ambulatory Visit: Payer: Self-pay | Admitting: Internal Medicine

## 2014-06-10 ENCOUNTER — Encounter: Payer: Self-pay | Admitting: Family Medicine

## 2014-06-10 ENCOUNTER — Ambulatory Visit (INDEPENDENT_AMBULATORY_CARE_PROVIDER_SITE_OTHER): Payer: PPO | Admitting: Family Medicine

## 2014-06-10 ENCOUNTER — Ambulatory Visit: Payer: PPO | Admitting: Internal Medicine

## 2014-06-10 VITALS — BP 108/62 | HR 79 | Temp 97.5°F | Wt 95.6 lb

## 2014-06-10 DIAGNOSIS — K56609 Unspecified intestinal obstruction, unspecified as to partial versus complete obstruction: Secondary | ICD-10-CM

## 2014-06-10 DIAGNOSIS — K5669 Other intestinal obstruction: Secondary | ICD-10-CM

## 2014-06-10 DIAGNOSIS — Z23 Encounter for immunization: Secondary | ICD-10-CM | POA: Diagnosis not present

## 2014-06-10 MED ORDER — PNEUMOCOCCAL 13-VAL CONJ VACC IM SUSP: 0.5000 mL | INTRAMUSCULAR | Status: DC

## 2014-06-10 NOTE — Progress Notes (Signed)
Patient ID: [name], female    DOB: 01/29/1946  Age: 69 y.o. MRN: [id number]    Subjective:  Subjective HPI [name] presents for f/u from hospital for SBO.  Pt had another "episode " in [place] since being in hospital in Feb.   Pt did not have to go to hospital this time.  It passed in a few days.    Review of Systems  Constitutional: Negative for activity change, appetite change, fatigue and unexpected weight change.  Respiratory: Negative for cough and shortness of breath.   Cardiovascular: Negative for chest pain and palpitations.  Psychiatric/Behavioral: Negative for behavioral problems and dysphoric mood. The patient is not nervous/anxious.     History Past Medical History  Diagnosis Date  . GERD (gastroesophageal reflux disease)   . CAD (coronary artery disease)     mild disease per cath in 2011  . Venous embolism and thrombosis of subclavian vein     after pacemaker insertion Oct 2011  . Pleural effusion      s/p right thoracentesis 03/09  . Small bowel obstruction   . Urinary incontinence      -INTERSTIM IMPLANT NOT FUNCTIONING PER PT  . RLS (restless legs syndrome)   . Primary dilated cardiomyopathy     EF 45 to 50% per echo in Jan 2012  . Vitamin B12 deficiency   . Gastroparesis   . Chronic nausea   . Chronic abdominal pain   . Chronic pain syndrome   . Pacemaker     CRT therapy; followed by Dr. Caryl Comes  . H/O: GI bleed     from Pradaxa  . Oxygen dependent     2 liters via nasal cannula at all times  . CHF (congestive heart failure)   . Anemia   . Hx of stress fx aug 2011    right hip   . Elbow fracture, left aug 2012  . Arthritis     right hip  . Hx of gastric ulcer   . Diverticulitis   . HTN (hypertension)     under control; has been on med. x "years"  . Hx MRSA infection   . Pneumonia - 03/09, 12/09, 02/10, 06/10    MOST RECENT FEB 2013  . Elevated liver enzymes   . Blood transfusion   . Anginal pain   . Shortness of breath   .  Fibromyalgia     COUPLE OF TIMES A YEAR-PT HAS EPISODES OF CONFUSION-USUALLY INVOLVES DAY/NIGHT REVERSAL AND EPISODES OF TWITCHING AND FALLS--SEES DR. Jacelyn Grip - NEUROLOGIST-LAST SEEN 07/10/11  . LBBB (left bundle branch block)   . Atrial fibrillation   . COPD (chronic obstructive pulmonary disease)     continuous O2- 2l  . Hepatitis     hx of in high school   . Silent aspiration     She has past surgical history that includes Appendectomy; pacemaker placement (12/21/2009); Peg removed; Tonsillectomy (age 66); Interstim implant revision (03/06/2011); Portacath placement (12/16/2009); Total abdominal hysterectomy; Interstim Implant placement (05/28/2006 - stage I); Interstim implant revision (10/23/2007); Cystoscopy with injection (04/30/2006); Small intestine surgery (05/20/2001); Cardiac catheterization (04/08/2006, 11/16/2009); Cystoscopy (11/11/2008); Cystoscopy w/ retrogrades (11/11/2008); Colectomy; Colectomy (02/04/2000); Gastrocutaneous fistula closure; Exploratory laparotomy (04/27/2009); Port-a-cath removal (07/27/2011); Total hip arthroplasty (08/03/2011); Interstim Implant placement (02/05/2012); Esophagogastroduodenoscopy (N/A, 02/02/2013); and Savory dilation (N/A, 02/02/2013).   Her family history includes Breast cancer in her maternal aunt; Diabetes in her paternal grandfather; Heart disease in her brother, father, mother, and paternal aunt; Stroke in her  mother. There is no history of Colon cancer.She reports that she quit smoking about 28 years ago. She has never used smokeless tobacco. She reports that she does not drink alcohol or use illicit drugs.  Current Outpatient Prescriptions on File Prior to Visit  Medication Sig Dispense Refill  . albuterol (PROAIR HFA) 108 (90 BASE) MCG/ACT inhaler Inhale 2 puffs into the lungs every 6 (six) hours as needed for wheezing or shortness of breath. 1 Inhaler 3  . AMBULATORY NON FORMULARY MEDICATION Pt takes Oxygen, 2 units, every day    . calcium carbonate  (OS-CAL - DOSED IN MG OF ELEMENTAL CALCIUM) 1250 (500 CA) MG tablet Take 2 tablets (1,000 mg of elemental calcium total) by mouth 3 (three) times daily with meals. 180 tablet 0  . carvedilol (COREG) 6.25 MG tablet TAKE ONE TABLET BY MOUTH TWICE DAILY WITH MEALS 60 tablet 0  . doxycycline (VIBRA-TABS) 100 MG tablet Take 1 tablet (100 mg total) by mouth 2 (two) times daily. 14 tablet 0  . furosemide (LASIX) 40 MG tablet TAKE ONE TABLET BY MOUTH IN THE MORNING 90 tablet 3  . gabapentin (NEURONTIN) 600 MG tablet TAKE ONE TABLET BY MOUTH 4 TIMES DAILY 120 tablet 11  . loperamide (IMODIUM A-D) 2 MG tablet Take 1/2 tablet by mouth daily as needed for diarrhea. 30 tablet 2  . MYRBETRIQ 25 MG TB24 tablet Take 1 tablet by mouth daily.    . nitroGLYCERIN (NITROSTAT) 0.4 MG SL tablet Place 0.4 mg under the tongue every 5 (five) minutes as needed. CHEST PAIN    . oxyCODONE-acetaminophen (PERCOCET) 10-325 MG per tablet Take 1 tablet by mouth every 4 (four) hours as needed. PAIN 120 tablet 0  . pantoprazole (PROTONIX) 40 MG tablet TAKE ONE TABLET BY MOUTH TWICE DAILY 60 tablet 0  . promethazine (PHENERGAN) 25 MG tablet TAKE ONE TABLET BY MOUTH EVERY 8 HOURS AS NEEDED FOR NAUSEA 40 tablet 0  . tiotropium (SPIRIVA HANDIHALER) 18 MCG inhalation capsule Place 1 capsule (18 mcg total) into inhaler and inhale every morning. (Patient taking differently: Place 18 mcg into inhaler and inhale daily as needed (shortness of breath). ) 30 capsule 5  . tiZANidine (ZANAFLEX) 4 MG tablet Take 1 tablet (4 mg total) by mouth 3 (three) times daily as needed for muscle spasms. 90 tablet 0  . Vitamin D, Ergocalciferol, (DRISDOL) 50000 UNITS CAPS capsule Take 1 capsule (50,000 Units total) by mouth every 7 (seven) days. 10 capsule 0  . zolpidem (AMBIEN) 5 MG tablet Take 1 tablet (5 mg total) by mouth at bedtime as needed. for sleep 30 tablet 0  . [DISCONTINUED] iron dextran complex (INFED) 50 MG/ML injection Please give infed infusion  (no test dose needed) over 4 hours per pharmacy calculated dose. Ht: 5'2 Wt: 99 pounds Hgb: 12 1 mL 0   No current facility-administered medications on file prior to visit.     Objective:  Objective Physical Exam  Constitutional: She is oriented to person, place, and time. She appears well-developed and well-nourished. No distress.  HENT:  Right Ear: External ear normal.  Left Ear: External ear normal.  Nose: Nose normal.  Mouth/Throat: Oropharynx is clear and moist.  Eyes: EOM are normal. Pupils are equal, round, and reactive to light.  Neck: Normal range of motion. Neck supple.  Cardiovascular: Normal rate, regular rhythm and normal heart sounds.   No murmur heard. Pulmonary/Chest: Effort normal and breath sounds normal. No respiratory distress. She has no wheezes. She has no  rales. She exhibits no tenderness.  Neurological: She is alert and oriented to person, place, and time.  Psychiatric: She has a normal mood and affect. Her behavior is normal. Judgment and thought content normal.   BP 108/62 mmHg  Pulse 79  Temp(Src) 97.5 F (36.4 C) (Oral)  Wt 95 lb 9.6 oz (43.364 kg)  SpO2 98% Wt Readings from Last 3 Encounters:  06/10/14 95 lb 9.6 oz (43.364 kg)  05/27/14 94 lb 6.4 oz (42.82 kg)  05/23/14 96 lb 1.9 oz (43.6 kg)     Lab Results  Component Value Date   WBC 6.7 05/28/2014   HGB 10.9* 05/28/2014   HCT 32.4* 05/28/2014   PLT 355.0 05/28/2014   GLUCOSE 96 05/28/2014   CHOL 171 07/22/2013   TRIG 131.0 07/22/2013   HDL 37.10* 07/22/2013   LDLCALC 108* 07/22/2013   ALT 18 05/28/2014   AST 25 05/28/2014   NA 137 05/28/2014   K 4.4 05/28/2014   CL 99 05/28/2014   CREATININE 0.69 05/28/2014   BUN 18 05/28/2014   CO2 35* 05/28/2014   TSH 4.43 06/26/2010   INR 1.12 05/20/2014   HGBA1C  08/17/2007    5.6 (NOTE)   The ADA recommends the following therapeutic goals for glycemic   control related to Hgb A1C measurement:   Goal of Therapy:   < 7.0% Hgb A1C   Action  Suggested:  > 8.0% Hgb A1C   Ref:  Diabetes Care, 22, Suppl. 1, 1999    Ct Abdomen Pelvis W Contrast  05/17/2014   CLINICAL DATA:  Generalized abdominal pain.  EXAM: CT ABDOMEN AND PELVIS WITH CONTRAST  TECHNIQUE: Multidetector CT imaging of the abdomen and pelvis was performed using the standard protocol following bolus administration of intravenous contrast.  CONTRAST:  52mL OMNIPAQUE IOHEXOL 300 MG/ML  SOLN  COMPARISON:  CT scan of December 11, 2011.  FINDINGS: Moderate degenerative disc disease is noted at L4-5 with grade 1 anterolisthesis secondary to posterior facet joint hypertrophy. Status post right total hip arthroplasty. Visualized lung bases appear normal.  No gallstones are noted. Stable intrahepatic and extrahepatic biliary dilatation is noted without definite cause of obstruction. The spleen and pancreas appear normal. Adrenal glands and kidneys appear normal without hydronephrosis or renal obstruction. There remains severe proximal small bowel dilatation concerning for distal small bowel obstruction, although definite transition zone is not identified. Stool is noted in the sigmoid colon. Atherosclerotic calcifications of abdominal aorta are noted without aneurysm formation. No abnormal fluid collection is noted. Urinary bladder appears normal. Status post hysterectomy.  IMPRESSION: Stable intrahepatic and extrahepatic biliary dilatation is noted without definite cause of obstruction.  Severe proximal small bowel dilatation is noted without definite transition zone identified. This is concerning for partial distal small bowel obstruction.   Electronically Signed   By: Marijo Conception, M.D.   On: 05/17/2014 14:15   Dg Abd Acute W/chest  05/18/2014   CLINICAL DATA:  History of small bowel obstruction.  EXAM: ACUTE ABDOMEN SERIES (ABDOMEN 2 VIEW & CHEST 1 VIEW)  COMPARISON:  05/17/2014 and chest x-ray 07/30/2011  FINDINGS: Left-sided pacemaker unchanged. Lungs are somewhat hypoinflated with  stable subtle focal interstitial density over the right upper lobe. Cardiomediastinal silhouette and remainder of the chest is unchanged.  Abdominal images demonstrate a nasogastric tube with tip over the mid abdomen right of midline likely over the distal stomach. There is a metallic device with associated small caliber catheter over the left pelvis unchanged. Segment of catheter  over the right upper pelvis unchanged.  There are several scattered air-fluid levels without significant change. Air and contrast are present within the colon. There is a mildly dilated air-filled small bowel loop over the right mid abdomen. No evidence of free peritoneal air. Remainder of the exam is unchanged.  IMPRESSION: Single air-filled mildly dilated small bowel loop over the right mid abdomen with Stable scattered air-fluid levels. No free peritoneal air. Findings may be due to early/partial small bowel obstruction.  No acute cardiopulmonary disease.  Enteric tube with tip over the mid abdomen right of midline likely within the distal stomach.   Electronically Signed   By: Marin Olp M.D.   On: 05/18/2014 13:29   Dg Abd Acute W/chest  05/17/2014   CLINICAL DATA:  Abdominal pain.  EXAM: ACUTE ABDOMEN SERIES (ABDOMEN 2 VIEW & CHEST 1 VIEW)  COMPARISON:  CT 05/17/2014.  Chest x-ray 05/15/2014, 07/30/2011.  FINDINGS: NG tube noted with tip in the stomach. Cardiac pacer with lead tips in right atrium and right ventricle. Chronic interstitial changes noted right upper lung.  Dilated proximal small bowel loops are noted. These findings are again consistent with small-bowel obstruction. Contrast is noted kidney from prior CT. No free air. No pneumatosis. Right hip replacement. Neurostimulator.  IMPRESSION: Persistent changes of proximal small bowel obstruction. NG tube is noted with its tip projected over the stomach.   Electronically Signed   By: Marcello Moores  Register   On: 05/17/2014 17:08     Assessment & Plan:  Plan I have  discontinued Ms. Vazquez's mupirocin ointment. I am also having her start on pneumococcal 13-valent conjugate vaccine. Additionally, I am having her maintain her tiotropium, nitroGLYCERIN, loperamide, MYRBETRIQ, carvedilol, furosemide, albuterol, AMBULATORY NON FORMULARY MEDICATION, oxyCODONE-acetaminophen, tiZANidine, gabapentin, calcium carbonate, Vitamin D (Ergocalciferol), doxycycline, zolpidem, promethazine, and pantoprazole.  Meds ordered this encounter  Medications  . pneumococcal 13-valent conjugate vaccine (PREVNAR 13) SUSP injection    Sig: Inject 0.5 mLs into the muscle tomorrow at 10 am.    Dispense:  0.5 mL    Refill:  0    Problem List Items Addressed This Visit    None    Visit Diagnoses    Need for pneumococcal vaccination    -  Primary    Relevant Medications    pneumococcal 13-valent conjugate vaccine (PREVNAR 13) SUSP injection       Follow-up: Return in about 6 months (around 12/11/2014), or if symptoms worsen or fail to improve, for f/u.  Garnet Koyanagi, DO

## 2014-06-10 NOTE — Patient Instructions (Signed)
Small Bowel Obstruction °A small bowel obstruction is a blockage (obstruction) of the small intestine (small bowel). The small bowel is a long, slender tube that connects the stomach to the colon. Its job is to absorb nutrients from the fluids and foods you consume into the bloodstream.  °CAUSES  °There are many causes of intestinal blockage. The most common ones include: °· Hernias. This is a more common cause in children than adults. °· Inflammatory bowel disease (enteritis and colitis). °· Twisting of the bowel (volvulus). °· Tumors. °· Scar tissue (adhesions) from previous surgery or radiation treatment. °· Recent surgery. This may cause an acute small bowel obstruction called an ileus. °SYMPTOMS  °· Abdominal pain. This may be dull cramps or sharp pain. It may occur in one area or may be present in the entire abdomen. Pain can range from mild to severe, depending on the degree of obstruction. °· Nausea and vomiting. Vomit may be greenish or yellow bile color. °· Distended or swollen stomach. Abdominal bloating is a common symptom. °· Constipation. °· Lack of passing gas. °· Frequent belching. °· Diarrhea. This may occur if runny stool is able to leak around the obstruction. °DIAGNOSIS  °Your caregiver can usually diagnose small bowel obstruction by taking a history, doing a physical exam, and taking X-rays. If the cause is unclear, a CT scan (computerized tomography) of your abdomen and pelvis may be needed. °TREATMENT  °Treatment of the blockage depends on the cause and how bad the problem is.  °· Sometimes, the obstruction improves with bed rest and intravenous (IV) fluids. °· Resting the bowel is very important. This means following a simple diet. Sometimes, a clear liquid diet may be required for several days. °· Sometimes, a small tube (nasogastric tube) is placed into the stomach to decompress the bowel. When the bowel is blocked, it usually swells up like a balloon filled with air and fluids.  Decompression means that the air and fluids are removed by suction through that tube. This can help with pain, discomfort, and nausea. It can also help the obstruction resolve faster. °· Surgery may be required if other treatments do not work. Bowel obstruction from a hernia may require early surgery and can be an emergency procedure. Adhesions that cause frequent or severe obstructions may also require surgery. °HOME CARE INSTRUCTIONS °If your bowel obstruction is only partial or incomplete, you may be allowed to go home. °· Get plenty of rest. °· Follow your diet as directed by your caregiver. °· Only consume clear liquids until your condition improves. °· Avoid solid foods as instructed. °SEEK IMMEDIATE MEDICAL CARE IF: °· You have increased pain or cramping. °· You vomit blood. °· You have uncontrolled vomiting or nausea. °· You cannot drink fluids due to vomiting or pain. °· You develop confusion. °· You begin feeling very dry or thirsty (dehydrated). °· You have severe bloating. °· You have chills. °· You have a fever. °· You feel extremely weak or you faint. °MAKE SURE YOU: °· Understand these instructions. °· Will watch your condition. °· Will get help right away if you are not doing well or get worse. °Document Released: 05/22/2005 Document Revised: 05/28/2011 Document Reviewed: 05/19/2010 °ExitCare® Patient Information ©2015 [employer]. This information is not intended to replace advice given to you by your health care provider. Make sure you discuss any questions you have with your health care provider. ° °

## 2014-06-10 NOTE — Assessment & Plan Note (Signed)
abd exam normal Symptoms resolved F/u GI as scheduled

## 2014-06-10 NOTE — Progress Notes (Signed)
Pre visit review using our clinic review tool, if applicable. No additional management support is needed unless otherwise documented below in the visit note. 

## 2014-06-24 ENCOUNTER — Ambulatory Visit (INDEPENDENT_AMBULATORY_CARE_PROVIDER_SITE_OTHER): Payer: PPO | Admitting: Cardiology

## 2014-06-24 ENCOUNTER — Encounter: Payer: Self-pay | Admitting: Cardiology

## 2014-06-24 VITALS — BP 104/72 | HR 71 | Ht 60.0 in | Wt 99.0 lb

## 2014-06-24 DIAGNOSIS — I5022 Chronic systolic (congestive) heart failure: Secondary | ICD-10-CM | POA: Diagnosis not present

## 2014-06-24 DIAGNOSIS — I420 Dilated cardiomyopathy: Secondary | ICD-10-CM

## 2014-06-24 DIAGNOSIS — I480 Paroxysmal atrial fibrillation: Secondary | ICD-10-CM | POA: Diagnosis not present

## 2014-06-24 NOTE — Progress Notes (Signed)
HPI She presents for followup of a nonischemic cardiomyopathy and atrial fibrillation.  She was recently in the hospital with a small bowel obstruction. I did review those records and an echocardiogram done at that time. Her ejection fraction which had been most recently 40-50% was said to be 60-65%. There were no significant abnormalities and she had no dysrhythmias during that hospitalization. She's had no cardiovascular symptoms. She's not having any new shortness of breath though she is on chronic oxygen. She's not had any palpitations, presyncope or syncope. She's not exercising like she used to but she wants to get back to this.   Allergies  Allergen Reactions  . Dabigatran Etexilate Mesylate Other (See Comments)    INTERNAL BLEEDING-pradaxa  . Talwin [Pentazocine] Other (See Comments)    hallucinations  . Pentazocine Lactate Other (See Comments)    HALLUCINATIONS  . Sulfonamide Derivatives Other (See Comments)    JITTERINESS     Current Outpatient Prescriptions  Medication Sig Dispense Refill  . albuterol (PROAIR HFA) 108 (90 BASE) MCG/ACT inhaler Inhale 2 puffs into the lungs every 6 (six) hours as needed for wheezing or shortness of breath. 1 Inhaler 3  . AMBULATORY NON FORMULARY MEDICATION Pt takes Oxygen, 2 units, every day    . calcium carbonate (OS-CAL - DOSED IN MG OF ELEMENTAL CALCIUM) 1250 (500 CA) MG tablet Take 2 tablets (1,000 mg of elemental calcium total) by mouth 3 (three) times daily with meals. 180 tablet 0  . carvedilol (COREG) 6.25 MG tablet TAKE ONE TABLET BY MOUTH TWICE DAILY WITH MEALS 60 tablet 0  . furosemide (LASIX) 40 MG tablet TAKE ONE TABLET BY MOUTH IN THE MORNING 90 tablet 3  . gabapentin (NEURONTIN) 600 MG tablet TAKE ONE TABLET BY MOUTH 4 TIMES DAILY 120 tablet 11  . loperamide (IMODIUM A-D) 2 MG tablet Take 1/2 tablet by mouth daily as needed for diarrhea. 30 tablet 2  . MYRBETRIQ 25 MG TB24 tablet Take 1 tablet by mouth daily.    . nitroGLYCERIN  (NITROSTAT) 0.4 MG SL tablet Place 0.4 mg under the tongue every 5 (five) minutes as needed. CHEST PAIN    . oxyCODONE-acetaminophen (PERCOCET) 10-325 MG per tablet Take 1 tablet by mouth every 4 (four) hours as needed. PAIN 120 tablet 0  . pantoprazole (PROTONIX) 40 MG tablet TAKE ONE TABLET BY MOUTH TWICE DAILY 60 tablet 0  . pneumococcal 13-valent conjugate vaccine (PREVNAR 13) SUSP injection Inject 0.5 mLs into the muscle tomorrow at 10 am. 0.5 mL 0  . promethazine (PHENERGAN) 25 MG tablet TAKE ONE TABLET BY MOUTH EVERY 8 HOURS AS NEEDED FOR NAUSEA 40 tablet 0  . tiotropium (SPIRIVA HANDIHALER) 18 MCG inhalation capsule Place 1 capsule (18 mcg total) into inhaler and inhale every morning. (Patient taking differently: Place 18 mcg into inhaler and inhale daily as needed (shortness of breath). ) 30 capsule 5  . tiZANidine (ZANAFLEX) 4 MG tablet Take 1 tablet (4 mg total) by mouth 3 (three) times daily as needed for muscle spasms. 90 tablet 0  . Vitamin D, Ergocalciferol, (DRISDOL) 50000 UNITS CAPS capsule Take 1 capsule (50,000 Units total) by mouth every 7 (seven) days. 10 capsule 0  . zolpidem (AMBIEN) 5 MG tablet Take 1 tablet (5 mg total) by mouth at bedtime as needed. for sleep 30 tablet 0  . [DISCONTINUED] iron dextran complex (INFED) 50 MG/ML injection Please give infed infusion (no test dose needed) over 4 hours per pharmacy calculated dose. Ht: 5'2 Wt: 99  pounds Hgb: 12 1 mL 0   No current facility-administered medications for this visit.    Past Medical History  Diagnosis Date  . GERD (gastroesophageal reflux disease)   . CAD (coronary artery disease)     mild disease per cath in 2011  . Venous embolism and thrombosis of subclavian vein     after pacemaker insertion Oct 2011  . Pleural effusion      s/p right thoracentesis 03/09  . Small bowel obstruction   . Urinary incontinence      -INTERSTIM IMPLANT NOT FUNCTIONING PER PT  . RLS (restless legs syndrome)   . Primary  dilated cardiomyopathy     EF 45 to 50% per echo in Jan 2012  . Vitamin B12 deficiency   . Gastroparesis   . Chronic nausea   . Chronic abdominal pain   . Chronic pain syndrome   . Pacemaker     CRT therapy; followed by Dr. Caryl Comes  . H/O: GI bleed     from Pradaxa  . Oxygen dependent     2 liters via nasal cannula at all times  . CHF (congestive heart failure)   . Anemia   . Hx of stress fx aug 2011    right hip   . Elbow fracture, left aug 2012  . Arthritis     right hip  . Hx of gastric ulcer   . Diverticulitis   . HTN (hypertension)     under control; has been on med. x "years"  . Hx MRSA infection   . Pneumonia - 03/09, 12/09, 02/10, 06/10    MOST RECENT FEB 2013  . Elevated liver enzymes   . Blood transfusion   . Anginal pain   . Shortness of breath   . Fibromyalgia     COUPLE OF TIMES A YEAR-PT HAS EPISODES OF CONFUSION-USUALLY INVOLVES DAY/NIGHT REVERSAL AND EPISODES OF TWITCHING AND FALLS--SEES DR. Jacelyn Grip - NEUROLOGIST-LAST SEEN 07/10/11  . LBBB (left bundle branch block)   . Atrial fibrillation   . COPD (chronic obstructive pulmonary disease)     continuous O2- 2l  . Hepatitis     hx of in high school   . Silent aspiration     Past Surgical History  Procedure Laterality Date  . Appendectomy    . Pacemaker placement  12/21/2009  . Peg removed      with complications  . Tonsillectomy  age 54  . Interstim implant revision  03/06/2011    Procedure: REVISION OF [name];  Surgeon: Reece Packer, MD;  Location: [facility];  Service: Urology;  Laterality: N/A;  Replacement of Neurostimulator  . Portacath placement  12/16/2009  . Total abdominal hysterectomy      complete  . Interstim implant placement  05/28/2006 - stage I    06/05/2006 - stage II  . Interstim implant revision  10/23/2007  . Cystoscopy with injection  04/30/2006    transurethral collagen injection; incision vaginal stenosis  . Small intestine surgery  05/20/2001    ex. lap., resection of small bowel  stricture; gastrostomy; insertion central line  . Cardiac catheterization  04/08/2006, 11/16/2009  . Cystoscopy  11/11/2008  . Cystoscopy w/ retrogrades  11/11/2008    right  . Colectomy      Intestinal resection (5times)  . Colectomy  02/04/2000    ex. lap., intra-abd. subtotal colectomy with ileosigmoid colon anastomosies and lysis of adhesions  . Gastrocutaneous fistula closure    . Exploratory laparotomy  04/27/2009  lysis of adhesions, gastrostomy tube  . Port-a-cath removal  07/27/2011    Procedure: REMOVAL PORT-A-CATH;  Surgeon: Rolm Bookbinder, MD;  Location: [facility];  Service: General;  Laterality: N/A;  . Total hip arthroplasty  08/03/2011    Procedure: TOTAL HIP ARTHROPLASTY ANTERIOR APPROACH;  Surgeon: Mcarthur Rossetti, MD;  Location: [facility];  Service: Orthopedics;  Laterality: Right;  . Interstim implant placement  02/05/2012    Procedure: INTERSTIM IMPLANT FIRST STAGE;  Surgeon: Reece Packer, MD;  Location: [facility];  Service: Urology;  Laterality: N/A;  Replacement of Interstim Lead     . Esophagogastroduodenoscopy N/A 02/02/2013    Procedure: ESOPHAGOGASTRODUODENOSCOPY (EGD);  Surgeon: Lafayette Dragon, MD;  Location: [facility];  Service: Endoscopy;  Laterality: N/A;  . Savory dilation N/A 02/02/2013    Procedure: SAVORY DILATION;  Surgeon: Lafayette Dragon, MD;  Location: [facility];  Service: Endoscopy;  Laterality: N/A;    ROS:   As stated in the HPI and negative for all other systems.  PHYSICAL EXAM BP 104/72 mmHg  Pulse 71  Ht 5' (1.524 m)  Wt 99 lb (44.906 kg)  BMI 19.33 kg/m2 GENERAL:  Frail appearing NECK:  No jugular venous distention, waveform within normal limits, carotid upstroke brisk and symmetric, no bruits, no thyromegaly LYMPHATICS:  No cervical, inguinal adenopathy LUNGS:  Clear to auscultation bilaterally BACK:  No CVA tenderness CHEST: CRT pocket intact HEART:  PMI not displaced or sustained,S1 and S2 within normal  limits, no S3, no S4, no clicks, no rubs, no murmurs ABD:  Flat, positive bowel sounds normal in frequency in pitch, no bruits, no rebound, no guarding, no midline pulsatile mass, no hepatomegaly, no splenomegaly EXT:  2 plus pulses throughout, no edema, no cyanosis no clubbing  EKG:  Sinus rhythm, rate 71, ventricular pacing 100% capture, QTC prolonged  06/24/2014   ASSESSMENT AND PLAN   ATRIAL FIBRILLATION -  She is not having symptomatic recurrences of this. She had some bleeding while on anticoagulation in the past. She has lung issues and so was taken off amiodarone in the past. At this point without recurrent palpitations (add this can be checked with her device as well) no change in therapy is indicated.  CARDIOMYOPATHY, PRIMARY, DILATED -  Her EF is now reported to be 65%. She is euvolemic.  No change in meds is indciated.   HYPERTENSION -  The blood pressure is at target. No change in medications is indicated. We will continue with therapeutic lifestyle changes (TLC).

## 2014-06-24 NOTE — Patient Instructions (Signed)
Dr.Hochrein wants you to follow-up in: [place] will receive a reminder letter in the mail two months in advance. If you don't receive a letter, please call our office to schedule the follow-up appointment.

## 2014-06-28 ENCOUNTER — Other Ambulatory Visit: Payer: Self-pay | Admitting: Family Medicine

## 2014-06-28 NOTE — Telephone Encounter (Signed)
Last seen 06/10/14 and filled 05/27/14 #30 UDS 01/09/13 low risk   Please advise     KP

## 2014-07-01 ENCOUNTER — Telehealth: Payer: Self-pay | Admitting: Family Medicine

## 2014-07-01 DIAGNOSIS — R52 Pain, unspecified: Secondary | ICD-10-CM

## 2014-07-01 MED ORDER — OXYCODONE-ACETAMINOPHEN 10-325 MG PO TABS: 1.0000 | ORAL_TABLET | ORAL | Status: DC | PRN

## 2014-07-01 NOTE — Telephone Encounter (Signed)
The patient is aware the Rx is ready for pick up.     KP

## 2014-07-01 NOTE — Telephone Encounter (Signed)
Last seen 06/10/14 and filled 04/20/14 #120 UDS 01/09/13 low risk.   Please advise. [name] patient)      KP

## 2014-07-01 NOTE — Telephone Encounter (Signed)
printed

## 2014-07-01 NOTE — Telephone Encounter (Signed)
Caller name: Eugene Relation to pt: self Call back number: [phone] Pharmacy:  Reason for call:   Requesting a new oxycodone

## 2014-07-07 ENCOUNTER — Other Ambulatory Visit: Payer: Self-pay | Admitting: Family Medicine

## 2014-07-09 ENCOUNTER — Other Ambulatory Visit: Payer: Self-pay | Admitting: Internal Medicine

## 2014-07-12 ENCOUNTER — Other Ambulatory Visit: Payer: Self-pay | Admitting: Internal Medicine

## 2014-07-12 ENCOUNTER — Other Ambulatory Visit: Payer: Self-pay

## 2014-07-12 MED ORDER — CARVEDILOL 6.25 MG PO TABS: 6.2500 mg | ORAL_TABLET | Freq: Two times a day (BID) | ORAL | Status: DC

## 2014-07-16 ENCOUNTER — Telehealth: Payer: Self-pay | Admitting: Internal Medicine

## 2014-07-16 DIAGNOSIS — D649 Anemia, unspecified: Secondary | ICD-10-CM

## 2014-07-16 NOTE — Telephone Encounter (Signed)
Spoke with patient and she will get lab done.

## 2014-07-16 NOTE — Telephone Encounter (Signed)
Please have CBC before her OV

## 2014-07-16 NOTE — Telephone Encounter (Signed)
Patient has OV on 07/23/14. Does she need labs prior?

## 2014-07-20 ENCOUNTER — Other Ambulatory Visit (INDEPENDENT_AMBULATORY_CARE_PROVIDER_SITE_OTHER): Payer: PPO

## 2014-07-20 DIAGNOSIS — D649 Anemia, unspecified: Secondary | ICD-10-CM

## 2014-07-20 LAB — CBC WITH DIFFERENTIAL/PLATELET
Basophils Absolute: 0 10*3/uL (ref 0.0–0.1)
Basophils Relative: 0.3 % (ref 0.0–3.0)
Eosinophils Absolute: 0.2 10*3/uL (ref 0.0–0.7)
Eosinophils Relative: 2.5 % (ref 0.0–5.0)
HCT: 31.4 % — ABNORMAL LOW (ref 36.0–46.0)
Hemoglobin: 10.5 g/dL — ABNORMAL LOW (ref 12.0–15.0)
Lymphocytes Relative: 8.4 % — ABNORMAL LOW (ref 12.0–46.0)
Lymphs Abs: 0.8 10*3/uL (ref 0.7–4.0)
MCHC: 33.6 g/dL (ref 30.0–36.0)
MCV: 88.9 fl (ref 78.0–100.0)
Monocytes Absolute: 0.7 10*3/uL (ref 0.1–1.0)
Monocytes Relative: 7.6 % (ref 3.0–12.0)
Neutro Abs: 7.3 10*3/uL (ref 1.4–7.7)
Neutrophils Relative %: 81.2 % — ABNORMAL HIGH (ref 43.0–77.0)
Platelets: 338 10*3/uL (ref 150.0–400.0)
RBC: 3.52 Mil/uL — ABNORMAL LOW (ref 3.87–5.11)
RDW: 13.8 % (ref 11.5–15.5)
WBC: 9 10*3/uL (ref 4.0–10.5)

## 2014-07-21 ENCOUNTER — Other Ambulatory Visit: Payer: Self-pay

## 2014-07-21 ENCOUNTER — Other Ambulatory Visit: Payer: Self-pay | Admitting: *Deleted

## 2014-07-21 ENCOUNTER — Telehealth: Payer: Self-pay | Admitting: *Deleted

## 2014-07-21 DIAGNOSIS — D509 Iron deficiency anemia, unspecified: Secondary | ICD-10-CM

## 2014-07-21 MED ORDER — CARVEDILOL 6.25 MG PO TABS: 6.2500 mg | ORAL_TABLET | Freq: Two times a day (BID) | ORAL | Status: DC

## 2014-07-21 NOTE — Telephone Encounter (Signed)
Patient given results and recommendations as per Dr. Olevia Perches. See results note.

## 2014-07-23 ENCOUNTER — Encounter: Payer: Self-pay | Admitting: Internal Medicine

## 2014-07-23 ENCOUNTER — Other Ambulatory Visit (INDEPENDENT_AMBULATORY_CARE_PROVIDER_SITE_OTHER): Payer: PPO

## 2014-07-23 ENCOUNTER — Ambulatory Visit (INDEPENDENT_AMBULATORY_CARE_PROVIDER_SITE_OTHER): Payer: PPO | Admitting: Internal Medicine

## 2014-07-23 VITALS — BP 122/62 | HR 76 | Ht 60.5 in | Wt 99.4 lb

## 2014-07-23 DIAGNOSIS — K56609 Unspecified intestinal obstruction, unspecified as to partial versus complete obstruction: Secondary | ICD-10-CM

## 2014-07-23 DIAGNOSIS — R633 Feeding difficulties, unspecified: Secondary | ICD-10-CM

## 2014-07-23 DIAGNOSIS — K5669 Other intestinal obstruction: Secondary | ICD-10-CM

## 2014-07-23 LAB — BUN: BUN: 20 mg/dL (ref 6–23)

## 2014-07-23 LAB — CREATININE, SERUM: Creatinine, Ser: 0.94 mg/dL (ref 0.40–1.20)

## 2014-07-23 MED ORDER — CIPROFLOXACIN HCL 250 MG PO TABS: 250.0000 mg | ORAL_TABLET | Freq: Two times a day (BID) | ORAL | Status: DC

## 2014-07-23 MED ORDER — FOOD THICKENER (THICKENUP CLEAR): 1.0000 | Freq: Three times a day (TID) | ORAL | Status: DC

## 2014-07-23 NOTE — Patient Instructions (Addendum)
We have sent the following medications to your pharmacy for you to pick up at your convenience:  Cipro and Resource  Your physician has requested that you go to the basement for  lab work before leaving today. Please have labs in 4 weeks.  You have been scheduled for a CT scan of the abdomen and pelvis at [facility] ([address]---this is in the same building as [facility]).   You are scheduled on 07/29/14 at 1030 am. You should arrive 15 minutes prior to your appointment time for registration. Please follow the written instructions below on the day of your exam:  WARNING: IF YOU ARE ALLERGIC TO IODINE/X-RAY DYE, PLEASE NOTIFY RADIOLOGY IMMEDIATELY AT [phone]! YOU WILL BE GIVEN A 13 HOUR PREMEDICATION PREP.  1) Do not eat or drink anything after 630 am (4 hours prior to your test) 2) You have been given 2 bottles of oral contrast to drink. The solution may taste better if refrigerated, but do NOT add ice or any other liquid to this solution. Shake well before drinking.    Drink 1 bottle of contrast @ 830 am (2 hours prior to your exam)  Drink 1 bottle of contrast @ 930 am (1 hour prior to your exam)  You may take any medications as prescribed with a small amount of water except for the following: Metformin, Glucophage, Glucovance, Avandamet, Riomet, Fortamet, Actoplus Met, Janumet, Glumetza or Metaglip. The above medications must be held the day of the exam AND 48 hours after the exam.  The purpose of you drinking the oral contrast is to aid in the visualization of your intestinal tract. The contrast solution may cause some diarrhea. Before your exam is started, you will be given a small amount of fluid to drink. Depending on your individual set of symptoms, you may also receive an intravenous injection of x-ray contrast/dye. Plan on being at [facility] for 30 minutes or long, depending on the type of exam you are having performed.  This test typically takes  30-45 minutes to complete.  If you have any questions regarding your exam or if you need to reschedule, you may call the CT department at [phone] between the hours of 8:00 am and 5:00 pm, Monday-Friday.  ________________________________________________________________________  Dr Etter Sjogren

## 2014-07-23 NOTE — Progress Notes (Signed)
[name] Dec 31, 1945 [id number]  Note: This dictation was prepared with Dragon digital system. Any transcriptional errors that result from this procedure are unintentional.   History of Present Illness: This is a  69 year old white female 2 months post hospitalization for partial small bowel obstruction due to adhesive disease. CT scan of the abdomen on 05/17/2014 confirmed massive small bowel dilatation without a definite transition point c/w distal SBO. She was treated with nasogastric suction for 3 days and had resolution of the obstruction. She since then had  several episodes of  recurrent abdominal pain but has not required hospitalization. Her last hemoglobin 2 days ago was 10.5 hematocrit 31.4. She has  nonischemic cardiomyopathy, ejection fraction of 40-50% which has recently increased to 60-65%. She has oxygen-dependent COPD and had an upper GI bleed while on Pradaxa several years ago. She  underwent multiple iron infusions last one in December 2015    Past Medical History  Diagnosis Date  . GERD (gastroesophageal reflux disease)   . CAD (coronary artery disease)     mild disease per cath in 2011  . Venous embolism and thrombosis of subclavian vein     after pacemaker insertion Oct 2011  . Pleural effusion      s/p right thoracentesis 03/09  . Small bowel obstruction   . Urinary incontinence      -INTERSTIM IMPLANT NOT FUNCTIONING PER PT  . RLS (restless legs syndrome)   . Primary dilated cardiomyopathy     EF 45 to 50% per echo in Jan 2012  . Vitamin B12 deficiency   . Gastroparesis   . Chronic nausea   . Chronic abdominal pain   . Chronic pain syndrome   . Pacemaker     CRT therapy; followed by Dr. Caryl Comes  . H/O: GI bleed     from Pradaxa  . Oxygen dependent     2 liters via nasal cannula at all times  . CHF (congestive heart failure)   . Anemia   . Hx of stress fx aug 2011    right hip   . Elbow fracture, left aug 2012  . Arthritis     right hip  . Hx of  gastric ulcer   . Diverticulitis   . HTN (hypertension)     under control; has been on med. x "years"  . Hx MRSA infection   . Pneumonia - 03/09, 12/09, 02/10, 06/10    MOST RECENT FEB 2013  . Elevated liver enzymes   . Blood transfusion   . Anginal pain   . Fibromyalgia     COUPLE OF TIMES A YEAR-PT HAS EPISODES OF CONFUSION-USUALLY INVOLVES DAY/NIGHT REVERSAL AND EPISODES OF TWITCHING AND FALLS--SEES DR. Jacelyn Grip - NEUROLOGIST-LAST SEEN 07/10/11  . LBBB (left bundle branch block)   . Atrial fibrillation   . COPD (chronic obstructive pulmonary disease)     continuous O2- 2l  . Hepatitis     hx of in high school   . Silent aspiration     Past Surgical History  Procedure Laterality Date  . Appendectomy    . Pacemaker placement  12/21/2009  . Peg removed      with complications  . Tonsillectomy  age 69  . Interstim implant revision  03/06/2011    Procedure: REVISION OF [name];  Surgeon: Reece Packer, MD;  Location: [facility];  Service: Urology;  Laterality: N/A;  Replacement of Neurostimulator  . Portacath placement  12/16/2009  . Total abdominal hysterectomy  complete  . Interstim implant placement  05/28/2006 - stage I    06/05/2006 - stage II  . Interstim implant revision  10/23/2007  . Cystoscopy with injection  04/30/2006    transurethral collagen injection; incision vaginal stenosis  . Small intestine surgery  05/20/2001    ex. lap., resection of small bowel stricture; gastrostomy; insertion central line  . Cardiac catheterization  04/08/2006, 11/16/2009  . Cystoscopy  11/11/2008  . Cystoscopy w/ retrogrades  11/11/2008    right  . Colectomy      Intestinal resection (5times)  . Colectomy  02/04/2000    ex. lap., intra-abd. subtotal colectomy with ileosigmoid colon anastomosies and lysis of adhesions  . Gastrocutaneous fistula closure    . Exploratory laparotomy  04/27/2009    lysis of adhesions, gastrostomy tube  . Port-a-cath removal  07/27/2011    Procedure: REMOVAL  PORT-A-CATH;  Surgeon: Rolm Bookbinder, MD;  Location: [facility];  Service: General;  Laterality: N/A;  . Total hip arthroplasty  08/03/2011    Procedure: TOTAL HIP ARTHROPLASTY ANTERIOR APPROACH;  Surgeon: Mcarthur Rossetti, MD;  Location: [facility];  Service: Orthopedics;  Laterality: Right;  . Interstim implant placement  02/05/2012    Procedure: INTERSTIM IMPLANT FIRST STAGE;  Surgeon: Reece Packer, MD;  Location: [facility];  Service: Urology;  Laterality: N/A;  Replacement of Interstim Lead     . Esophagogastroduodenoscopy N/A 02/02/2013    Procedure: ESOPHAGOGASTRODUODENOSCOPY (EGD);  Surgeon: Lafayette Dragon, MD;  Location: [facility];  Service: Endoscopy;  Laterality: N/A;  . Savory dilation N/A 02/02/2013    Procedure: SAVORY DILATION;  Surgeon: Lafayette Dragon, MD;  Location: [facility];  Service: Endoscopy;  Laterality: N/A;    Allergies  Allergen Reactions  . Dabigatran Etexilate Mesylate Other (See Comments)    INTERNAL BLEEDING-pradaxa  . Talwin [Pentazocine] Other (See Comments)    hallucinations  . Pentazocine Lactate Other (See Comments)    HALLUCINATIONS  . Sulfonamide Derivatives Other (See Comments)    JITTERINESS     Family history and social history have been reviewed.  Review of Systems:  Intermittent abdominal pain. Diarrhea. Denies nausea vomiting  The remainder of the 10 point ROS is negative except as outlined in the H&P  Physical Exam: General Appearance  Sling. Chronically ill-appearing. With nasal oxygen in no distress Eyes  Non icteric  HEENT  Non traumatic, normocephalic  Mouth No lesion, tongue papillated, no cheilosis Neck Supple without adenopathy, thyroid not enlarged, no carotid bruits, no JVD Lungs Clear to auscultation bilaterally COR Normal S1, normal S2, regular rhythm, no murmur, quiet precordium Abdomen  Soft hyperactive bowel sounds in all quadrants diffuse tenderness. No rebound. Well-healed surgical scars  most of the tenderness is located at left middle quadrant overlying the old gastrostomy site Rectal  Not done Extremities  No pedal edema Skin No lesions Neurological Alert and oriented x 3 Psychological Normal mood and affect  Assessment and Plan:    69 year old white female with chronic low-grade small bowel obstruction with exacerbation, requiring hospitalization and nasogastric suction which resolved without surgical intervention. She is still intermittently symptomatic, indicating the obstruction is  partially present, but she is not a surgical candidate. . She may have bacterial overgrowth. We will start Cipro 250 mg twice a day for 10 days and repeat the CT scan of the abdomen to follow-up on the small bowel obstruction. We will also prescribe Resource 8 ounces 3 times a day for nutritional support. Her weight is  down from 103 to. 99 pounds today. She will have blood count and Iron panel repeated in 4 weeks .    Delfin Edis 07/23/2014

## 2014-07-28 ENCOUNTER — Other Ambulatory Visit: Payer: Self-pay | Admitting: Family Medicine

## 2014-07-29 ENCOUNTER — Ambulatory Visit (INDEPENDENT_AMBULATORY_CARE_PROVIDER_SITE_OTHER)
Admission: RE | Admit: 2014-07-29 | Discharge: 2014-07-29 | Disposition: A | Discharge status: Home / Self Care | Payer: PPO | Source: Ambulatory Visit | Attending: Internal Medicine | Admitting: Internal Medicine

## 2014-07-29 ENCOUNTER — Other Ambulatory Visit: Payer: Self-pay | Admitting: Internal Medicine

## 2014-07-29 ENCOUNTER — Telehealth: Payer: Self-pay | Admitting: *Deleted

## 2014-07-29 DIAGNOSIS — K56609 Unspecified intestinal obstruction, unspecified as to partial versus complete obstruction: Secondary | ICD-10-CM

## 2014-07-29 DIAGNOSIS — R633 Feeding difficulties, unspecified: Secondary | ICD-10-CM

## 2014-07-29 DIAGNOSIS — K5669 Other intestinal obstruction: Secondary | ICD-10-CM | POA: Diagnosis not present

## 2014-07-29 NOTE — Telephone Encounter (Signed)
Patient is at [facility] and they have stuck patient 4 times unsuccessfully. Patient does not want to go to hospital for CT.Requesting CT without IV contrast. Spoke with Amy Esterwood, PA and ok to do CT without IV contrast.

## 2014-07-29 NOTE — Telephone Encounter (Signed)
Last seen 06/10/14 and filled 06/28/14 #30 UDS 01/09/13 low risk   Please advise      KP

## 2014-08-02 ENCOUNTER — Ambulatory Visit: Payer: Medicare Other | Admitting: Family Medicine

## 2014-08-04 ENCOUNTER — Ambulatory Visit (INDEPENDENT_AMBULATORY_CARE_PROVIDER_SITE_OTHER): Payer: PPO | Admitting: Pulmonary Disease

## 2014-08-04 ENCOUNTER — Encounter: Payer: Self-pay | Admitting: Pulmonary Disease

## 2014-08-04 ENCOUNTER — Other Ambulatory Visit: Payer: Self-pay | Admitting: Family Medicine

## 2014-08-04 ENCOUNTER — Other Ambulatory Visit: Payer: Self-pay | Admitting: Internal Medicine

## 2014-08-04 VITALS — BP 82/54 | HR 68 | Ht 60.5 in | Wt 103.0 lb

## 2014-08-04 DIAGNOSIS — J432 Centrilobular emphysema: Secondary | ICD-10-CM | POA: Diagnosis not present

## 2014-08-04 DIAGNOSIS — J189 Pneumonia, unspecified organism: Secondary | ICD-10-CM

## 2014-08-04 DIAGNOSIS — J9611 Chronic respiratory failure with hypoxia: Secondary | ICD-10-CM | POA: Diagnosis not present

## 2014-08-04 MED ORDER — TIOTROPIUM BROMIDE MONOHYDRATE 18 MCG IN CAPS: 18.0000 ug | ORAL_CAPSULE | RESPIRATORY_TRACT | Status: DC

## 2014-08-04 NOTE — Telephone Encounter (Signed)
Sent Rx for pantoprazole (PROTONIX), 40 mg, #60 with 3 refills to [facility]

## 2014-08-04 NOTE — Progress Notes (Signed)
Chief Complaint  Patient presents with  . Follow-up    Denies any increased SOB, cough and wheezing. Pt c/o some PND, occassional pain in RLL and cough. Pt BP low today- pt reports having low BP readings with new medication. Denies any issues related to BP- no dizziness, fatigue    History of Present Illness: [name] is a 69 y.o. female with history of COPD/emphysema, chronic hypoxemic respiratory failure, and recurrent pneumonia with focal areas of bronchiectasis.  Her breathing has been okay.  She gets occasional discomfort in Rt lower chest, but this doesn't last very long.  She denies sputum, fever, sweats or hemoptysis.  She still gets occasional cough, but this seems to come from post-nasal drip.  She is excited about her one grand daughter getting married and other grand daughter graduating from high school.   TESTS: 06/28/08 PFT >> FEV1 1.95 (97%), FEV1% 70, TLC 3.88 (87%), DLCO 64% ONO 02/11/10 >> Test time 9 hrs 9 min, Mean SpO2 87%, low SpO2 80%, Spent 5hrs 57 min (65%) with SpO2 < 88%. 10/10/11 PFT >> FEV1 1.15 (59%), FEV1% 64, TLC 3.26 (73%), DLCO 35%, + BD CT chest 04/30/2011 >> Chronic multifocal nodular and patchy airspace densities throughout the right lung with mild BTX.  PMHx > GERD, Gastroparesis, PUD, CAD, Dilated CM, HTN, s/p PM, LBBB, Atrial fibrillation, RLS, Chronic pain, Fibromyalgia  PSHx, Medications, Allergies, Fhx, Shx reviewed.  Physical Exam: Blood pressure 82/54, pulse 68, height 5' 0.5" (1.537 m), weight 103 lb (46.72 kg), SpO2 99 %. Body mass index is 19.78 kg/(m^2).  General - No distress, wearing oxygen, thin ENT - No sinus tenderness, no oral exudate, no LAN Cardiac - s1s2 regular, no murmur Chest - Prolonged exhalation, decreased breath sounds, no wheeze Back - No focal tenderness Abd - Soft, non-tender Ext - No edema Neuro - Normal strength Skin - No rashes Psych - normal mood, and behavior   Assessment/Plan:  COPD with  emphysema. Plan: - continue spiriva and prn proair  Chronic respiratory failure with hypoxia. Plan: - continue 2 liters supplemental oxygen with exertion and sleep  Recurrent pneumonia related to aspiration with focal BTX in Rt lower lung region. She has done well recently. Plan: - monitor clinically   Chesley Mires, MD [facility] Pager:  [phone] 08/04/2014, 9:53 AM

## 2014-08-04 NOTE — Patient Instructions (Signed)
Follow up in 6 months 

## 2014-08-09 ENCOUNTER — Telehealth: Payer: Self-pay | Admitting: Family Medicine

## 2014-08-09 DIAGNOSIS — R52 Pain, unspecified: Secondary | ICD-10-CM

## 2014-08-09 MED ORDER — OXYCODONE-ACETAMINOPHEN 10-325 MG PO TABS: 1.0000 | ORAL_TABLET | ORAL | Status: DC | PRN

## 2014-08-09 NOTE — Telephone Encounter (Signed)
Caller name: Janera Peugh Relationship to patient: self Can be reached: [phone] Pharmacy: Pt will come in to pick up RX.   Reason for call: Pt needs refill on oxyCODONE-acetaminophen (PERCOCET) 10-325 MG per tablet. Taking 2-4 per day. Pt has enough for 3-4 days. Please notify when RX is ready for pick up.

## 2014-08-09 NOTE — Telephone Encounter (Signed)
[facility] Patient   Last seen 06/10/14 and filled 07/01/14 #120 UDS 01/09/13 low risk    Please advise     KP

## 2014-08-09 NOTE — Telephone Encounter (Signed)
VM left advising Rx ready for pick up.     KP 

## 2014-08-09 NOTE — Telephone Encounter (Signed)
Will grant refill in PCP absence but patient is well overdue for UDS following our office policies.  She will have to give urine sample at time of pick up to receive Rx.

## 2014-08-19 ENCOUNTER — Telehealth: Payer: Self-pay | Admitting: *Deleted

## 2014-08-19 NOTE — Telephone Encounter (Signed)
Spoke with patient and she will come for labs. 

## 2014-08-19 NOTE — Telephone Encounter (Signed)
-----   Message from Hulan Saas, RN sent at 07/21/2014  9:44 AM EDT ----- Call and remind patient due for CBC, Iron panel on 08/23/14 for DB. Lab in.

## 2014-08-24 ENCOUNTER — Other Ambulatory Visit (INDEPENDENT_AMBULATORY_CARE_PROVIDER_SITE_OTHER): Payer: PPO

## 2014-08-24 DIAGNOSIS — D509 Iron deficiency anemia, unspecified: Secondary | ICD-10-CM

## 2014-08-24 LAB — CBC WITH DIFFERENTIAL/PLATELET
Basophils Absolute: 0.1 10*3/uL (ref 0.0–0.1)
Basophils Relative: 0.6 % (ref 0.0–3.0)
Eosinophils Absolute: 0.3 10*3/uL (ref 0.0–0.7)
Eosinophils Relative: 3.3 % (ref 0.0–5.0)
HCT: 33.3 % — ABNORMAL LOW (ref 36.0–46.0)
Hemoglobin: 11 g/dL — ABNORMAL LOW (ref 12.0–15.0)
Lymphocytes Relative: 5.5 % — ABNORMAL LOW (ref 12.0–46.0)
Lymphs Abs: 0.5 10*3/uL — ABNORMAL LOW (ref 0.7–4.0)
MCHC: 32.9 g/dL (ref 30.0–36.0)
MCV: 89.8 fl (ref 78.0–100.0)
Monocytes Absolute: 0.6 10*3/uL (ref 0.1–1.0)
Monocytes Relative: 6.3 % (ref 3.0–12.0)
Neutro Abs: 8.2 10*3/uL — ABNORMAL HIGH (ref 1.4–7.7)
Neutrophils Relative %: 84.3 % — ABNORMAL HIGH (ref 43.0–77.0)
Platelets: 315 10*3/uL (ref 150.0–400.0)
RBC: 3.71 Mil/uL — ABNORMAL LOW (ref 3.87–5.11)
RDW: 13.4 % (ref 11.5–15.5)
WBC: 9.7 10*3/uL (ref 4.0–10.5)

## 2014-08-24 LAB — IBC PANEL
Iron: 63 ug/dL (ref 42–145)
Saturation Ratios: 23.3 % (ref 20.0–50.0)
Transferrin: 193 mg/dL — ABNORMAL LOW (ref 212.0–360.0)

## 2014-08-25 ENCOUNTER — Other Ambulatory Visit: Payer: Self-pay | Admitting: *Deleted

## 2014-08-25 DIAGNOSIS — D509 Iron deficiency anemia, unspecified: Secondary | ICD-10-CM

## 2014-08-27 ENCOUNTER — Other Ambulatory Visit: Payer: Self-pay | Admitting: *Deleted

## 2014-08-27 DIAGNOSIS — Z1239 Encounter for other screening for malignant neoplasm of breast: Secondary | ICD-10-CM

## 2014-09-02 ENCOUNTER — Other Ambulatory Visit: Payer: Self-pay | Admitting: Family Medicine

## 2014-09-02 NOTE — Telephone Encounter (Signed)
Rx printed and faxed to the pharmacy.  Received confirmation.//AB/CMA

## 2014-09-02 NOTE — Telephone Encounter (Signed)
Requesting Ambien '5mg'$ -Take 1 tablet by mouth at bedtime as needed for sleep. Last refill:07-29-14;#30,0 Last OV:06-10-14 Please advise.//AB/CMA

## 2014-09-03 ENCOUNTER — Other Ambulatory Visit: Payer: Self-pay | Admitting: Family Medicine

## 2014-09-06 ENCOUNTER — Inpatient Hospital Stay (HOSPITAL_BASED_OUTPATIENT_CLINIC_OR_DEPARTMENT_OTHER)
Admission: RE | Admit: 2014-09-06 | Discharge: 2014-09-06 | Disposition: A | Discharge status: Home / Self Care | Payer: PPO | Source: Ambulatory Visit | Attending: Family Medicine | Admitting: Family Medicine

## 2014-09-14 ENCOUNTER — Telehealth: Payer: Self-pay | Admitting: Family Medicine

## 2014-09-14 ENCOUNTER — Other Ambulatory Visit: Payer: Self-pay | Admitting: Family Medicine

## 2014-09-14 ENCOUNTER — Ambulatory Visit (HOSPITAL_BASED_OUTPATIENT_CLINIC_OR_DEPARTMENT_OTHER)
Admission: RE | Admit: 2014-09-14 | Discharge: 2014-09-14 | Disposition: A | Discharge status: Home / Self Care | Payer: PPO | Source: Ambulatory Visit | Attending: Family Medicine | Admitting: Family Medicine

## 2014-09-14 DIAGNOSIS — Z1231 Encounter for screening mammogram for malignant neoplasm of breast: Secondary | ICD-10-CM | POA: Insufficient documentation

## 2014-09-14 DIAGNOSIS — R52 Pain, unspecified: Secondary | ICD-10-CM

## 2014-09-14 DIAGNOSIS — Z1239 Encounter for other screening for malignant neoplasm of breast: Secondary | ICD-10-CM

## 2014-09-14 MED ORDER — OXYCODONE-ACETAMINOPHEN 10-325 MG PO TABS: 1.0000 | ORAL_TABLET | ORAL | Status: DC | PRN

## 2014-09-14 NOTE — Telephone Encounter (Signed)
VM left advising Rx ready for pick up.     KP 

## 2014-09-14 NOTE — Telephone Encounter (Signed)
Last seen 06/10/14 and filled 08/09/14 #120 UDS 08/09/14 low risk   Please advise     KP

## 2014-09-14 NOTE — Telephone Encounter (Signed)
Relation to pt: self  Call back number: [phone]   Reason for call:   Pt requesting a refill oxyCODONE-acetaminophen (PERCOCET) 10-325 MG per tablet. Pt states she has a mamogram appointment today at 11:45am and would like to pick RX.

## 2014-09-14 NOTE — Telephone Encounter (Signed)
printed

## 2014-09-23 ENCOUNTER — Telehealth: Payer: Self-pay | Admitting: Internal Medicine

## 2014-09-23 NOTE — Telephone Encounter (Signed)
Patient notified of recommendations( due in August)

## 2014-09-29 ENCOUNTER — Other Ambulatory Visit: Payer: Self-pay | Admitting: Family Medicine

## 2014-09-29 NOTE — Telephone Encounter (Signed)
Last seen 06/10/14 and filled 09/02/14 #30 UDS 08/13/14 Low risk   Please advise     KP

## 2014-09-30 MED ORDER — ZOLPIDEM TARTRATE 5 MG PO TABS: 5.0000 mg | ORAL_TABLET | Freq: Every evening | ORAL | Status: DC | PRN

## 2014-09-30 NOTE — Addendum Note (Signed)
Addended by: Ewing Schlein on: 09/30/2014 08:14 AM   Modules accepted: Orders

## 2014-09-30 NOTE — Telephone Encounter (Signed)
Rx faxed.    KP 

## 2014-10-21 ENCOUNTER — Telehealth: Payer: Self-pay | Admitting: *Deleted

## 2014-10-21 NOTE — Telephone Encounter (Signed)
-----   Message from Hulan Saas, RN sent at 08/25/2014  9:42 AM EDT ----- Call and remind due for labs on 8/8 for DB. Lab in EPIC.

## 2014-10-21 NOTE — Telephone Encounter (Signed)
Patient will come for labs.  

## 2014-10-25 ENCOUNTER — Telehealth: Payer: Self-pay | Admitting: Family Medicine

## 2014-10-25 DIAGNOSIS — R52 Pain, unspecified: Secondary | ICD-10-CM

## 2014-10-25 MED ORDER — OXYCODONE-ACETAMINOPHEN 10-325 MG PO TABS: 1.0000 | ORAL_TABLET | ORAL | Status: DC | PRN

## 2014-10-25 NOTE — Telephone Encounter (Signed)
patient aware Rx will be ready tomorrow.     KP

## 2014-10-25 NOTE — Telephone Encounter (Signed)
Last seen 06/10/14 and filled 09/14/14 #120 UDS 08/09/14 low risk. Please advise     KP

## 2014-10-25 NOTE — Telephone Encounter (Signed)
Caller name: Kirra Relationship to patient: Self  Can be reached:[phone] Pharmacy: [facility]   Reason for call:written rx oxycodone

## 2014-10-25 NOTE — Telephone Encounter (Signed)
Refill x1 each

## 2014-11-01 ENCOUNTER — Other Ambulatory Visit (INDEPENDENT_AMBULATORY_CARE_PROVIDER_SITE_OTHER): Payer: PPO

## 2014-11-01 ENCOUNTER — Other Ambulatory Visit: Payer: Self-pay | Admitting: Family Medicine

## 2014-11-01 DIAGNOSIS — D509 Iron deficiency anemia, unspecified: Secondary | ICD-10-CM | POA: Diagnosis not present

## 2014-11-01 LAB — CBC WITH DIFFERENTIAL/PLATELET
Basophils Absolute: 0 10*3/uL (ref 0.0–0.1)
Basophils Relative: 0.6 % (ref 0.0–3.0)
Eosinophils Absolute: 0.1 10*3/uL (ref 0.0–0.7)
Eosinophils Relative: 1.8 % (ref 0.0–5.0)
HCT: 34.5 % — ABNORMAL LOW (ref 36.0–46.0)
Hemoglobin: 11.3 g/dL — ABNORMAL LOW (ref 12.0–15.0)
Lymphocytes Relative: 10 % — ABNORMAL LOW (ref 12.0–46.0)
Lymphs Abs: 0.7 10*3/uL (ref 0.7–4.0)
MCHC: 32.8 g/dL (ref 30.0–36.0)
MCV: 87.1 fl (ref 78.0–100.0)
Monocytes Absolute: 0.6 10*3/uL (ref 0.1–1.0)
Monocytes Relative: 8.7 % (ref 3.0–12.0)
Neutro Abs: 5.4 10*3/uL (ref 1.4–7.7)
Neutrophils Relative %: 78.9 % — ABNORMAL HIGH (ref 43.0–77.0)
Platelets: 284 10*3/uL (ref 150.0–400.0)
RBC: 3.96 Mil/uL (ref 3.87–5.11)
RDW: 13.4 % (ref 11.5–15.5)
WBC: 6.9 10*3/uL (ref 4.0–10.5)

## 2014-11-01 LAB — IBC PANEL
Iron: 28 ug/dL — ABNORMAL LOW (ref 42–145)
Saturation Ratios: 9.2 % — ABNORMAL LOW (ref 20.0–50.0)
Transferrin: 217 mg/dL (ref 212.0–360.0)

## 2014-11-01 NOTE — Telephone Encounter (Signed)
Last seen 06/10/14 and filled 09/30/14 #30   Please advise     KP

## 2014-11-02 ENCOUNTER — Other Ambulatory Visit: Payer: Self-pay | Admitting: *Deleted

## 2014-11-02 ENCOUNTER — Ambulatory Visit: Payer: PPO | Admitting: Family Medicine

## 2014-11-02 DIAGNOSIS — D509 Iron deficiency anemia, unspecified: Secondary | ICD-10-CM

## 2014-11-11 ENCOUNTER — Ambulatory Visit (INDEPENDENT_AMBULATORY_CARE_PROVIDER_SITE_OTHER): Payer: PPO | Admitting: Internal Medicine

## 2014-11-11 ENCOUNTER — Encounter: Payer: Self-pay | Admitting: Internal Medicine

## 2014-11-11 ENCOUNTER — Telehealth: Payer: Self-pay | Admitting: *Deleted

## 2014-11-11 ENCOUNTER — Other Ambulatory Visit: Payer: Self-pay | Admitting: *Deleted

## 2014-11-11 VITALS — BP 128/76 | HR 76 | Ht 65.0 in | Wt 99.4 lb

## 2014-11-11 DIAGNOSIS — E538 Deficiency of other specified B group vitamins: Secondary | ICD-10-CM | POA: Diagnosis not present

## 2014-11-11 DIAGNOSIS — D509 Iron deficiency anemia, unspecified: Secondary | ICD-10-CM

## 2014-11-11 DIAGNOSIS — K219 Gastro-esophageal reflux disease without esophagitis: Secondary | ICD-10-CM | POA: Diagnosis not present

## 2014-11-11 DIAGNOSIS — R633 Feeding difficulties, unspecified: Secondary | ICD-10-CM

## 2014-11-11 DIAGNOSIS — D649 Anemia, unspecified: Secondary | ICD-10-CM

## 2014-11-11 NOTE — Progress Notes (Signed)
[name] September 21, 1945 [id number]  Note: This dictation was prepared with Dragon digital system. Any transcriptional errors that result from this procedure are unintentional.   History of Present Illness: This is a 69 year old white female with chronic partial small bowel obstruction due to adhesive disease. Status post multiple abdominal operations. Oxygen-dependent COPD. Chronic anemia due to low-grade GI blood loss. Last CT scan of the abdomen in February 2016 showed small bowel dilatation with no definite transition point consistent with distal small bowel obstruction. Patient has experienced several bouts of small bowel obstruction requiring hospitalization. She has been on oral supplements and iron infusions. Last infusion in December 2015. Last  flexible sigmoidoscopy was done in October 2012 and showed the subtotal colectomy with anastomosis at 20 cm. Ulceration found at 60 cm in the small bowel. Multiple erosions and superficial ulcerations. Edematous mucosa. Unable to advance beyond 70 cm due to obstruction. Her last appointment was in May 2016, she weighed 103 pounds. Last hemoglobin in the 11/01/2014 was 11.3. Iron saturation 9%.  She has no complaints today. There was only one episode of obstruction lasting 24 hours since her last visit in May 2016. Her weight is down to 99 pounds    Past Medical History  Diagnosis Date  . GERD (gastroesophageal reflux disease)   . CAD (coronary artery disease)     mild disease per cath in 2011  . Venous embolism and thrombosis of subclavian vein     after pacemaker insertion Oct 2011  . Pleural effusion      s/p right thoracentesis 03/09  . Small bowel obstruction   . Urinary incontinence      -INTERSTIM IMPLANT NOT FUNCTIONING PER PT  . RLS (restless legs syndrome)   . Primary dilated cardiomyopathy     EF 45 to 50% per echo in Jan 2012  . Vitamin B12 deficiency   . Gastroparesis   . Chronic nausea   . Chronic abdominal pain   .  Chronic pain syndrome   . Pacemaker     CRT therapy; followed by Dr. Caryl Comes  . H/O: GI bleed     from Pradaxa  . Oxygen dependent     2 liters via nasal cannula at all times  . CHF (congestive heart failure)   . Anemia   . Hx of stress fx aug 2011    right hip   . Elbow fracture, left aug 2012  . Arthritis     right hip  . Hx of gastric ulcer   . Diverticulitis   . HTN (hypertension)     under control; has been on med. x "years"  . Hx MRSA infection   . Pneumonia - 03/09, 12/09, 02/10, 06/10    MOST RECENT FEB 2013  . Elevated liver enzymes   . Blood transfusion   . Anginal pain   . Fibromyalgia     COUPLE OF TIMES A YEAR-PT HAS EPISODES OF CONFUSION-USUALLY INVOLVES DAY/NIGHT REVERSAL AND EPISODES OF TWITCHING AND FALLS--SEES DR. Jacelyn Grip - NEUROLOGIST-LAST SEEN 07/10/11  . LBBB (left bundle branch block)   . Atrial fibrillation   . COPD (chronic obstructive pulmonary disease)     continuous O2- 2l  . Hepatitis     hx of in high school   . Silent aspiration     Past Surgical History  Procedure Laterality Date  . Appendectomy    . Pacemaker placement  12/21/2009  . Peg removed      with complications  . Tonsillectomy  age 43  . Interstim implant revision  03/06/2011    Procedure: REVISION OF [name];  Surgeon: Reece Packer, MD;  Location: [facility];  Service: Urology;  Laterality: N/A;  Replacement of Neurostimulator  . Portacath placement  12/16/2009  . Total abdominal hysterectomy      complete  . Interstim implant placement  05/28/2006 - stage I    06/05/2006 - stage II  . Interstim implant revision  10/23/2007  . Cystoscopy with injection  04/30/2006    transurethral collagen injection; incision vaginal stenosis  . Small intestine surgery  05/20/2001    ex. lap., resection of small bowel stricture; gastrostomy; insertion central line  . Cardiac catheterization  04/08/2006, 11/16/2009  . Cystoscopy  11/11/2008  . Cystoscopy w/ retrogrades  11/11/2008    right  .  Colectomy      Intestinal resection (5times)  . Colectomy  02/04/2000    ex. lap., intra-abd. subtotal colectomy with ileosigmoid colon anastomosies and lysis of adhesions  . Gastrocutaneous fistula closure    . Exploratory laparotomy  04/27/2009    lysis of adhesions, gastrostomy tube  . Port-a-cath removal  07/27/2011    Procedure: REMOVAL PORT-A-CATH;  Surgeon: Rolm Bookbinder, MD;  Location: [facility];  Service: General;  Laterality: N/A;  . Total hip arthroplasty  08/03/2011    Procedure: TOTAL HIP ARTHROPLASTY ANTERIOR APPROACH;  Surgeon: Mcarthur Rossetti, MD;  Location: [facility];  Service: Orthopedics;  Laterality: Right;  . Interstim implant placement  02/05/2012    Procedure: INTERSTIM IMPLANT FIRST STAGE;  Surgeon: Reece Packer, MD;  Location: [facility];  Service: Urology;  Laterality: N/A;  Replacement of Interstim Lead     . Esophagogastroduodenoscopy N/A 02/02/2013    Procedure: ESOPHAGOGASTRODUODENOSCOPY (EGD);  Surgeon: Lafayette Dragon, MD;  Location: [facility];  Service: Endoscopy;  Laterality: N/A;  . Savory dilation N/A 02/02/2013    Procedure: SAVORY DILATION;  Surgeon: Lafayette Dragon, MD;  Location: [facility];  Service: Endoscopy;  Laterality: N/A;    Allergies  Allergen Reactions  . Dabigatran Etexilate Mesylate Other (See Comments)    INTERNAL BLEEDING-pradaxa  . Talwin [Pentazocine] Other (See Comments)    hallucinations  . Pentazocine Lactate Other (See Comments)    HALLUCINATIONS  . Sulfonamide Derivatives Other (See Comments)    JITTERINESS     Family history and social history have been reviewed.  Review of Systems: Denies abdominal pain. Denies diarrhea. No vomiting for at least 2 months.  The remainder of the 10 point ROS is negative except as outlined in the H&P  Physical Exam: General Appearance thin chronically ill appearing in no distress, oxygen via nasal prongs Eyes  Non icteric  HEENT  Non traumatic, normocephalic   Mouth No lesion, tongue papillated, no cheilosis Neck Supple without adenopathy, thyroid not enlarged, no carotid bruits, no JVD Lungs Clear to auscultation bilaterally COR Normal S1, normal S2, regular rhythm, no murmur, quiet precordium Abdomen multiple healed surgical scars. Quiet bowel sounds. No tenderness. Rectal not done Extremities  No pedal edema Skin No lesions Neurological Alert and oriented x 3 Psychological Normal mood and affect  Assessment and Plan:   69 year old white female with the adhesive disease and a chronic low-grade small bowel obstruction. Chronic GI blood loss. Subtotal colectomy. Recurrent iron deficiency. We will schedule her for iron infusion of ferraheme 2 separate infusions. She will have CBC repeated 2 weeks following the infusion  Oxygen-dependent COPD followed by pulmonary  History of atrial fibrillation.  History of GI bleed while onPradaxa which has been discontinued   Continue all other medications. She would like to follow after my retirement by [employer].     Delfin Edis 11/11/2014

## 2014-11-11 NOTE — Patient Instructions (Addendum)
You will be contacted by [facility] regarding your iron infusions.  Dr Etter Sjogren

## 2014-11-11 NOTE — Telephone Encounter (Signed)
Per Dr. Olevia Perches, needs Feraheme x 2 doses and CBC 2 weeks after second infusion. Spoke with Karena Addison at [facility] short stay and scheduled on 11/29/14 at 12PM and 12/03/14 at 4:00 PM. Orders in EPIC. Lab in EPIC.(Dec 20, 2014)  Left a message for patient to call back.

## 2014-11-12 ENCOUNTER — Telehealth: Payer: Self-pay | Admitting: *Deleted

## 2014-11-12 ENCOUNTER — Ambulatory Visit: Payer: PPO | Admitting: Internal Medicine

## 2014-11-12 ENCOUNTER — Other Ambulatory Visit: Payer: Self-pay | Admitting: Internal Medicine

## 2014-11-12 NOTE — Telephone Encounter (Signed)
Yes and Yes

## 2014-11-12 NOTE — Telephone Encounter (Signed)
Patient given dates for infusion and labs.

## 2014-11-12 NOTE — Telephone Encounter (Signed)
Dr Carlean Purl- Patient would like to switch care to you since Dr Nichola Sizer retirement. She also needs a refill on phenergan (she was just seen yesterday). May I refill and are you willing to accept?

## 2014-11-15 ENCOUNTER — Encounter: Payer: Self-pay | Admitting: Family Medicine

## 2014-11-15 ENCOUNTER — Ambulatory Visit (INDEPENDENT_AMBULATORY_CARE_PROVIDER_SITE_OTHER): Payer: PPO | Admitting: Family Medicine

## 2014-11-15 VITALS — BP 120/70 | HR 67 | Temp 97.9°F | Ht 65.0 in | Wt 100.2 lb

## 2014-11-15 DIAGNOSIS — M810 Age-related osteoporosis without current pathological fracture: Secondary | ICD-10-CM | POA: Diagnosis not present

## 2014-11-15 DIAGNOSIS — R52 Pain, unspecified: Secondary | ICD-10-CM

## 2014-11-15 DIAGNOSIS — Z23 Encounter for immunization: Secondary | ICD-10-CM

## 2014-11-15 MED ORDER — ZOLPIDEM TARTRATE 5 MG PO TABS: 5.0000 mg | ORAL_TABLET | Freq: Every evening | ORAL | Status: DC | PRN

## 2014-11-15 MED ORDER — OXYCODONE-ACETAMINOPHEN 10-325 MG PO TABS: 1.0000 | ORAL_TABLET | ORAL | Status: DC | PRN

## 2014-11-15 MED ORDER — TIZANIDINE HCL 4 MG PO TABS: 4.0000 mg | ORAL_TABLET | Freq: Three times a day (TID) | ORAL | Status: DC | PRN

## 2014-11-15 NOTE — Progress Notes (Signed)
Pre visit review using our clinic review tool, if applicable. No additional management support is needed unless otherwise documented below in the visit note. 

## 2014-11-15 NOTE — Patient Instructions (Addendum)
Fibromyalgia Fibromyalgia is a disorder that is often misunderstood. It is associated with muscular pains and tenderness that comes and goes. It is often associated with fatigue and sleep disturbances. Though it tends to be long-lasting, fibromyalgia is not life-threatening. CAUSES  The exact cause of fibromyalgia is unknown. People with certain gene types are predisposed to developing fibromyalgia and other conditions. Certain factors can play a role as triggers, such as:  Spine disorders.  Arthritis.  Severe injury (trauma) and other physical stressors.  Emotional stressors. SYMPTOMS   The main symptom is pain and stiffness in the muscles and joints, which can vary over time.  Sleep and fatigue problems. Other related symptoms may include:  Bowel and bladder problems.  Headaches.  Visual problems.  Problems with odors and noises.  Depression or mood changes.  Painful periods (dysmenorrhea).  Dryness of the skin or eyes. DIAGNOSIS  There are no specific tests for diagnosing fibromyalgia. Patients can be diagnosed accurately from the specific symptoms they have. The diagnosis is made by determining that nothing else is causing the problems. TREATMENT  There is no cure. Management includes medicines and an active, healthy lifestyle. The goal is to enhance physical fitness, decrease pain, and improve sleep. HOME CARE INSTRUCTIONS   Only take over-the-counter or prescription medicines as directed by your caregiver. Sleeping pills, tranquilizers, and pain medicines may make your problems worse.  Low-impact aerobic exercise is very important and advised for treatment. At first, it may seem to make pain worse. Gradually increasing your tolerance will overcome this feeling.  Learning relaxation techniques and how to control stress will help you. Biofeedback, visual imagery, hypnosis, muscle relaxation, yoga, and meditation are all options.  Anti-inflammatory medicines and  physical therapy may provide short-term help.  Acupuncture or massage treatments may help.  Take muscle relaxant medicines as suggested by your caregiver.  Avoid stressful situations.  Plan a healthy lifestyle. This includes your diet, sleep, rest, exercise, and friends.  Find and practice a hobby you enjoy.  Join a fibromyalgia support group for interaction, ideas, and sharing advice. This may be helpful. SEEK MEDICAL CARE IF:  You are not having good results or improvement from your treatment. FOR MORE INFORMATION  National Fibromyalgia Association: www.fmaware.[place]: www.arthritis.org Document Released: 03/05/2005 Document Revised: 05/28/2011 Document Reviewed: 06/15/2009 [facility] Patient Information 2015 [place], [place]. This information is not intended to replace advice given to you by your health care provider. Make sure you discuss any questions you have with your health care provider.  Back Pain, Adult Low back pain is very common. About 1 in 5 people have back pain.The cause of low back pain is rarely dangerous. The pain often gets better over time.About half of people with a sudden onset of back pain feel better in just 2 weeks. About 8 in 10 people feel better by 6 weeks.  CAUSES Some common causes of back pain include:  Strain of the muscles or ligaments supporting the spine.  Wear and tear (degeneration) of the spinal discs.  Arthritis.  Direct injury to the back. DIAGNOSIS Most of the time, the direct cause of low back pain is not known.However, back pain can be treated effectively even when the exact cause of the pain is unknown.Answering your caregiver's questions about your overall health and symptoms is one of the most accurate ways to make sure the cause of your pain is not dangerous. If your caregiver needs more information, he or she may order lab work or imaging tests (X-rays  or MRIs).However, even if imaging tests show changes in your  back, this usually does not require surgery. HOME CARE INSTRUCTIONS For many people, back pain returns.Since low back pain is rarely dangerous, it is often a condition that people can learn to [facility] their own.   Remain active. It is stressful on the back to sit or stand in one place. Do not sit, drive, or stand in one place for more than 30 minutes at a time. Take short walks on level surfaces as soon as pain allows.Try to increase the length of time you walk each day.  Do not stay in bed.Resting more than 1 or 2 days can delay your recovery.  Do not avoid exercise or work.Your body is made to move.It is not dangerous to be active, even though your back may hurt.Your back will likely heal faster if you return to being active before your pain is gone.  Pay attention to your body when you bend and lift. Many people have less discomfortwhen lifting if they bend their knees, keep the load close to their bodies,and avoid twisting. Often, the most comfortable positions are those that put less stress on your recovering back.  Find a comfortable position to sleep. Use a firm mattress and lie on your side with your knees slightly bent. If you lie on your back, put a pillow under your knees.  Only take over-the-counter or prescription medicines as directed by your caregiver. Over-the-counter medicines to reduce pain and inflammation are often the most helpful.Your caregiver may prescribe muscle relaxant drugs.These medicines help dull your pain so you can more quickly return to your normal activities and healthy exercise.  Put ice on the injured area.  Put ice in a plastic bag.  Place a towel between your skin and the bag.  Leave the ice on for 15-20 minutes, 03-04 times a day for the first 2 to 3 days. After that, ice and heat may be alternated to reduce pain and spasms.  Ask your caregiver about trying back exercises and gentle massage. This may be of some benefit.  Avoid feeling  anxious or stressed.Stress increases muscle tension and can worsen back pain.It is important to recognize when you are anxious or stressed and learn ways to manage it.Exercise is a great option. SEEK MEDICAL CARE IF:  You have pain that is not relieved with rest or medicine.  You have pain that does not improve in 1 week.  You have new symptoms.  You are generally not feeling well. SEEK IMMEDIATE MEDICAL CARE IF:   You have pain that radiates from your back into your legs.  You develop new bowel or bladder control problems.  You have unusual weakness or numbness in your arms or legs.  You develop nausea or vomiting.  You develop abdominal pain.  You feel faint. Document Released: 03/05/2005 Document Revised: 09/04/2011 Document Reviewed: 07/07/2013 [facility] Patient Information 2015 [place], [place]. This information is not intended to replace advice given to you by your health care provider. Make sure you discuss any questions you have with your health care provider.

## 2014-11-15 NOTE — Progress Notes (Signed)
Patient ID: [name], female   DOB: 25-Dec-1945, 69 y.o.   MRN: [id number]   Subjective:    Patient ID: [name], female    DOB: 1945/06/15, 69 y.o.   MRN: [id number]  Chief Complaint  Patient presents with  . Fibromyalgia    HPI Patient is in today for f/u and refills.  She had been to ER with headaches and was given butalbitol/ cf/ acetaminophen--- and that helped.    Past Medical History  Diagnosis Date  . GERD (gastroesophageal reflux disease)   . CAD (coronary artery disease)     mild disease per cath in 2011  . Venous embolism and thrombosis of subclavian vein     after pacemaker insertion Oct 2011  . Pleural effusion      s/p right thoracentesis 03/09  . Small bowel obstruction   . Urinary incontinence      -INTERSTIM IMPLANT NOT FUNCTIONING PER PT  . RLS (restless legs syndrome)   . Primary dilated cardiomyopathy     EF 45 to 50% per echo in Jan 2012  . Vitamin B12 deficiency   . Gastroparesis   . Chronic nausea   . Chronic abdominal pain   . Chronic pain syndrome   . Pacemaker     CRT therapy; followed by Dr. Caryl Comes  . H/O: GI bleed     from Pradaxa  . Oxygen dependent     2 liters via nasal cannula at all times  . CHF (congestive heart failure)   . Anemia   . Hx of stress fx aug 2011    right hip   . Elbow fracture, left aug 2012  . Arthritis     right hip  . Hx of gastric ulcer   . Diverticulitis   . HTN (hypertension)     under control; has been on med. x "years"  . Hx MRSA infection   . Pneumonia - 03/09, 12/09, 02/10, 06/10    MOST RECENT FEB 2013  . Elevated liver enzymes   . Blood transfusion   . Anginal pain   . Fibromyalgia     COUPLE OF TIMES A YEAR-PT HAS EPISODES OF CONFUSION-USUALLY INVOLVES DAY/NIGHT REVERSAL AND EPISODES OF TWITCHING AND FALLS--SEES DR. Jacelyn Grip - NEUROLOGIST-LAST SEEN 07/10/11  . LBBB (left bundle branch block)   . Atrial fibrillation   . COPD (chronic obstructive pulmonary disease)     continuous O2- 2l  .  Hepatitis     hx of in high school   . Silent aspiration     Past Surgical History  Procedure Laterality Date  . Appendectomy    . Pacemaker placement  12/21/2009  . Peg removed      with complications  . Tonsillectomy  age 43  . Interstim implant revision  03/06/2011    Procedure: REVISION OF [name];  Surgeon: Reece Packer, MD;  Location: [facility];  Service: Urology;  Laterality: N/A;  Replacement of Neurostimulator  . Portacath placement  12/16/2009  . Total abdominal hysterectomy      complete  . Interstim implant placement  05/28/2006 - stage I    06/05/2006 - stage II  . Interstim implant revision  10/23/2007  . Cystoscopy with injection  04/30/2006    transurethral collagen injection; incision vaginal stenosis  . Small intestine surgery  05/20/2001    ex. lap., resection of small bowel stricture; gastrostomy; insertion central line  . Cardiac catheterization  04/08/2006, 11/16/2009  . Cystoscopy  11/11/2008  .  Cystoscopy w/ retrogrades  11/11/2008    right  . Colectomy      Intestinal resection (5times)  . Colectomy  02/04/2000    ex. lap., intra-abd. subtotal colectomy with ileosigmoid colon anastomosies and lysis of adhesions  . Gastrocutaneous fistula closure    . Exploratory laparotomy  04/27/2009    lysis of adhesions, gastrostomy tube  . Port-a-cath removal  07/27/2011    Procedure: REMOVAL PORT-A-CATH;  Surgeon: Rolm Bookbinder, MD;  Location: [facility];  Service: General;  Laterality: N/A;  . Total hip arthroplasty  08/03/2011    Procedure: TOTAL HIP ARTHROPLASTY ANTERIOR APPROACH;  Surgeon: Mcarthur Rossetti, MD;  Location: [facility];  Service: Orthopedics;  Laterality: Right;  . Interstim implant placement  02/05/2012    Procedure: INTERSTIM IMPLANT FIRST STAGE;  Surgeon: Reece Packer, MD;  Location: [facility];  Service: Urology;  Laterality: N/A;  Replacement of Interstim Lead     . Esophagogastroduodenoscopy N/A 02/02/2013    Procedure:  ESOPHAGOGASTRODUODENOSCOPY (EGD);  Surgeon: Lafayette Dragon, MD;  Location: [facility];  Service: Endoscopy;  Laterality: N/A;  . Savory dilation N/A 02/02/2013    Procedure: SAVORY DILATION;  Surgeon: Lafayette Dragon, MD;  Location: [facility];  Service: Endoscopy;  Laterality: N/A;    Family History  Problem Relation Age of Onset  . Heart disease Father   . Stroke Mother   . Heart disease Mother   . Colon cancer Neg Hx   . Heart disease Brother   . Breast cancer Maternal Aunt   . Heart disease Paternal Aunt   . Diabetes Paternal Grandfather     Social History   Social History  . Marital Status: Married    Spouse Name: N/A  . Number of Children: 5  . Years of Education: N/A   Occupational History  . disabled    Social History Main Topics  . Smoking status: Former Smoker -- 1.00 packs/day for 20 years    Quit date: 03/19/1986  . Smokeless tobacco: Never Used  . Alcohol Use: No  . Drug Use: No  . Sexual Activity: Yes   Other Topics Concern  . Not on file   Social History Narrative   Occupation: Disabled   Alcohol Use - no   Illicit Drug Use - no   Patient is a former smoker, quit 1988 x48yr, <1ppd.    Daily Caffeine Use    Outpatient Prescriptions Prior to Visit  Medication Sig Dispense Refill  . albuterol (PROAIR HFA) 108 (90 BASE) MCG/ACT inhaler Inhale 2 puffs into the lungs every 6 (six) hours as needed for wheezing or shortness of breath. 1 Inhaler 3  . AMBULATORY NON FORMULARY MEDICATION Pt takes Oxygen, 2 units, every day    . calcium carbonate (OS-CAL - DOSED IN MG OF ELEMENTAL CALCIUM) 1250 (500 CA) MG tablet Take 2 tablets (1,000 mg of elemental calcium total) by mouth 3 (three) times daily with meals. 180 tablet 0  . carvedilol (COREG) 6.25 MG tablet Take 1 tablet (6.25 mg total) by mouth 2 (two) times daily with a meal. 60 tablet 3  . food thickener (RESOURCE THICKENUP CLEAR) POWD Take 120 g by mouth 3 (three) times daily. 90 Can 6  . furosemide  (LASIX) 40 MG tablet TAKE ONE TABLET BY MOUTH IN THE MORNING 90 tablet 3  . gabapentin (NEURONTIN) 600 MG tablet TAKE ONE TABLET BY MOUTH 4 TIMES DAILY 120 tablet 11  . loperamide (IMODIUM A-D) 2 MG tablet Take  1/2 tablet by mouth daily as needed for diarrhea. 30 tablet 2  . MYRBETRIQ 25 MG TB24 tablet Take 1 tablet by mouth daily.    . nitroGLYCERIN (NITROSTAT) 0.4 MG SL tablet Place 0.4 mg under the tongue every 5 (five) minutes as needed. CHEST PAIN    . pantoprazole (PROTONIX) 40 MG tablet TAKE ONE TABLET BY MOUTH TWICE DAILY 60 tablet 3  . promethazine (PHENERGAN) 25 MG tablet TAKE ONE TABLET BY MOUTH EVERY 8 HOURS AS NEEDED FOR NAUSEA 40 tablet 1  . tiotropium (SPIRIVA HANDIHALER) 18 MCG inhalation capsule Place 1 capsule (18 mcg total) into inhaler and inhale every morning. 10 capsule 0  . oxyCODONE-acetaminophen (PERCOCET) 10-325 MG per tablet Take 1 tablet by mouth every 4 (four) hours as needed. PAIN 120 tablet 0  . tiZANidine (ZANAFLEX) 4 MG tablet TAKE ONE TABLET BY MOUTH THREE TIMES DAILY AS NEEDED FOR MUSCLE SPASM 90 tablet 0  . zolpidem (AMBIEN) 5 MG tablet TAKE ONE TABLET BY MOUTH AT BEDTIME AS NEEDED 30 tablet 0   No facility-administered medications prior to visit.    Allergies  Allergen Reactions  . Dabigatran Etexilate Mesylate Other (See Comments)    INTERNAL BLEEDING-pradaxa  . Talwin [Pentazocine] Other (See Comments)    hallucinations  . Pentazocine Lactate Other (See Comments)    HALLUCINATIONS  . Sulfonamide Derivatives Other (See Comments)    JITTERINESS     Review of Systems  Constitutional: Negative for fever and malaise/fatigue.  HENT: Negative for congestion.   Eyes: Negative for discharge.  Respiratory: Negative for shortness of breath.   Cardiovascular: Negative for chest pain, palpitations and leg swelling.  Gastrointestinal: Negative for nausea and abdominal pain.  Genitourinary: Negative for dysuria.  Musculoskeletal: Negative for falls.  Skin:  Negative for rash.  Neurological: Negative for loss of consciousness and headaches.  Endo/Heme/Allergies: Negative for environmental allergies.  Psychiatric/Behavioral: Negative for depression. The patient is not nervous/anxious.        Objective:    Physical Exam  Constitutional: She is oriented to person, place, and time. She appears well-developed and well-nourished.  HENT:  Head: Normocephalic and atraumatic.  Eyes: Conjunctivae and EOM are normal.  Neck: Normal range of motion. Neck supple. No JVD present. Carotid bruit is not present. No thyromegaly present.  Cardiovascular: Normal rate, regular rhythm and normal heart sounds.   No murmur heard. Pulmonary/Chest: Effort normal and breath sounds normal. No respiratory distress. She has no wheezes. She has no rales. She exhibits no tenderness.  Musculoskeletal: She exhibits no edema.  Neurological: She is alert and oriented to person, place, and time.  Psychiatric: She has a normal mood and affect. Her behavior is normal.    BP 120/70 mmHg  Pulse 67  Temp(Src) 97.9 F (36.6 C) (Oral)  Ht '5\' 5"'$  (1.651 m)  Wt 100 lb 3.2 oz (45.45 kg)  BMI 16.67 kg/m2  SpO2 98% Wt Readings from Last 3 Encounters:  11/15/14 100 lb 3.2 oz (45.45 kg)  11/11/14 99 lb 6 oz (45.076 kg)  08/04/14 103 lb (46.72 kg)     Lab Results  Component Value Date   WBC 6.9 11/01/2014   HGB 11.3* 11/01/2014   HCT 34.5* 11/01/2014   PLT 284.0 11/01/2014   GLUCOSE 79 11/15/2014   CHOL 171 07/22/2013   TRIG 131.0 07/22/2013   HDL 37.10* 07/22/2013   LDLCALC 108* 07/22/2013   ALT 18 05/28/2014   AST 25 05/28/2014   NA 143 11/15/2014   K 4.9  11/15/2014   CL 99 11/15/2014   CREATININE 0.92 11/15/2014   BUN 17 11/15/2014   CO2 35* 11/15/2014   TSH 4.43 06/26/2010   INR 1.12 05/20/2014   HGBA1C  08/17/2007    5.6 (NOTE)   The ADA recommends the following therapeutic goals for glycemic   control related to Hgb A1C measurement:   Goal of Therapy:   <  7.0% Hgb A1C   Action Suggested:  > 8.0% Hgb A1C   Ref:  Diabetes Care, 22, Suppl. 1, 1999    Lab Results  Component Value Date   TSH 4.43 06/26/2010   Lab Results  Component Value Date   WBC 6.9 11/01/2014   HGB 11.3* 11/01/2014   HCT 34.5* 11/01/2014   MCV 87.1 11/01/2014   PLT 284.0 11/01/2014   Lab Results  Component Value Date   NA 143 11/15/2014   K 4.9 11/15/2014   CO2 35* 11/15/2014   GLUCOSE 79 11/15/2014   BUN 17 11/15/2014   CREATININE 0.92 11/15/2014   BILITOT 0.3 05/28/2014   ALKPHOS 85 05/28/2014   AST 25 05/28/2014   ALT 18 05/28/2014   PROT 6.4 05/28/2014   ALBUMIN 3.7 05/28/2014   CALCIUM 9.4 11/15/2014   ANIONGAP 4* 05/23/2014   GFR 64.14 11/15/2014   Lab Results  Component Value Date   CHOL 171 07/22/2013   Lab Results  Component Value Date   HDL 37.10* 07/22/2013   Lab Results  Component Value Date   LDLCALC 108* 07/22/2013   Lab Results  Component Value Date   TRIG 131.0 07/22/2013   Lab Results  Component Value Date   CHOLHDL 5 07/22/2013   Lab Results  Component Value Date   HGBA1C  08/17/2007    5.6 (NOTE)   The ADA recommends the following therapeutic goals for glycemic   control related to Hgb A1C measurement:   Goal of Therapy:   < 7.0% Hgb A1C   Action Suggested:  > 8.0% Hgb A1C   Ref:  Diabetes Care, 22, Suppl. 1, 1999       Assessment & Plan:   Problem List Items Addressed This Visit    None    Visit Diagnoses    Pain    -  Primary    Relevant Medications    oxyCODONE-acetaminophen (PERCOCET) 10-325 MG per tablet    Osteoporosis        Relevant Orders    Basic Metabolic Panel (BMET) (Completed)    Need for prophylactic vaccination and inoculation against unspecified single disease        Relevant Orders    Pneumococcal conjugate vaccine 13-valent (Completed)       I have changed Ms. Cammarata's oxyCODONE-acetaminophen, tiZANidine, and zolpidem. I am also having her maintain her nitroGLYCERIN, loperamide,  MYRBETRIQ, furosemide, albuterol, AMBULATORY NON FORMULARY MEDICATION, gabapentin, calcium carbonate, carvedilol, food thickener, pantoprazole, tiotropium, and promethazine.  Meds ordered this encounter  Medications  . oxyCODONE-acetaminophen (PERCOCET) 10-325 MG per tablet    Sig: Take 1 tablet by mouth every 4 (four) hours as needed. DON'T FILL UNTIL 11/28/14    Dispense:  120 tablet    Refill:  0  . tiZANidine (ZANAFLEX) 4 MG tablet    Sig: Take 1 tablet (4 mg total) by mouth 3 (three) times daily as needed. for muscle spams. DON'T FILL UNTIL 12/02/14    Dispense:  90 tablet    Refill:  0  . zolpidem (AMBIEN) 5 MG tablet    Sig: Take  1 tablet (5 mg total) by mouth at bedtime as needed. DON'T FILL UNTIL 12/02/14    Dispense:  30 tablet    Refill:  0     Garnet Koyanagi, DO

## 2014-11-16 LAB — BASIC METABOLIC PANEL
BUN: 17 mg/dL (ref 6–23)
CO2: 35 mEq/L — ABNORMAL HIGH (ref 19–32)
Calcium: 9.4 mg/dL (ref 8.4–10.5)
Chloride: 99 mEq/L (ref 96–112)
Creatinine, Ser: 0.92 mg/dL (ref 0.40–1.20)
GFR: 64.14 mL/min (ref >60.00)
Glucose, Bld: 79 mg/dL (ref 70–99)
Potassium: 4.9 mEq/L (ref 3.5–5.1)
Sodium: 143 mEq/L (ref 135–145)

## 2014-11-20 DIAGNOSIS — R519 Headache, unspecified: Secondary | ICD-10-CM | POA: Insufficient documentation

## 2014-11-20 DIAGNOSIS — R51 Headache: Secondary | ICD-10-CM

## 2014-11-24 ENCOUNTER — Telehealth: Payer: Self-pay | Admitting: *Deleted

## 2014-11-24 ENCOUNTER — Other Ambulatory Visit: Payer: Self-pay | Admitting: *Deleted

## 2014-11-24 DIAGNOSIS — D509 Iron deficiency anemia, unspecified: Secondary | ICD-10-CM

## 2014-11-24 NOTE — Telephone Encounter (Signed)
OK 

## 2014-11-24 NOTE — Telephone Encounter (Signed)
Spoke with patient and scheduled OV with Dr. Carlean Purl.

## 2014-11-24 NOTE — Telephone Encounter (Signed)
Patient is scheduled at [facility] short stay for Feraheme 510 mg on 9/12 and 9/15 for iron def. Anemia.Lab on 11/01/14- Iron 28, Transferrin 217.0 , Sat. Ratio 9.2, Hgb 11.3 and hct 34.5) She is a [facility] patient and needs new orders with a practicing MD. DOD- [facility] Is it ok to order?

## 2014-11-24 NOTE — Telephone Encounter (Signed)
Patient will need to schedule an OV with Dr. Carlean Purl to establish new MD.

## 2014-11-24 NOTE — Telephone Encounter (Signed)
Spoke with patient and she would like to see Dr. Carlean Purl as her new GI. Dr. Carlean Purl will you accept patient?

## 2014-11-24 NOTE — Telephone Encounter (Signed)
OK to order Fe for 9/12 and 9/15. She needs to establish and have appt scheduled with her new GI MD for future orders even if appt is some time away. Please assign her a new GI MD.

## 2014-11-25 ENCOUNTER — Other Ambulatory Visit (INDEPENDENT_AMBULATORY_CARE_PROVIDER_SITE_OTHER): Payer: PPO

## 2014-11-25 DIAGNOSIS — D509 Iron deficiency anemia, unspecified: Secondary | ICD-10-CM

## 2014-11-25 LAB — CBC WITH DIFFERENTIAL/PLATELET
Basophils Absolute: 0 10*3/uL (ref 0.0–0.1)
Basophils Relative: 0.3 % (ref 0.0–3.0)
Eosinophils Absolute: 0.2 10*3/uL (ref 0.0–0.7)
Eosinophils Relative: 3.1 % (ref 0.0–5.0)
HCT: 38 % (ref 36.0–46.0)
Hemoglobin: 12.5 g/dL (ref 12.0–15.0)
Lymphocytes Relative: 7.2 % — ABNORMAL LOW (ref 12.0–46.0)
Lymphs Abs: 0.5 10*3/uL — ABNORMAL LOW (ref 0.7–4.0)
MCHC: 32.8 g/dL (ref 30.0–36.0)
MCV: 86.1 fl (ref 78.0–100.0)
Monocytes Absolute: 0.6 10*3/uL (ref 0.1–1.0)
Monocytes Relative: 8 % (ref 3.0–12.0)
Neutro Abs: 6.3 10*3/uL (ref 1.4–7.7)
Neutrophils Relative %: 81.4 % — ABNORMAL HIGH (ref 43.0–77.0)
Platelets: 262 10*3/uL (ref 150.0–400.0)
RBC: 4.41 Mil/uL (ref 3.87–5.11)
RDW: 13.3 % (ref 11.5–15.5)
WBC: 7.7 10*3/uL (ref 4.0–10.5)

## 2014-11-25 LAB — IBC PANEL
Iron: 63 ug/dL (ref 42–145)
Saturation Ratios: 20.5 % (ref 20.0–50.0)
Transferrin: 219 mg/dL (ref 212.0–360.0)

## 2014-11-28 NOTE — Progress Notes (Signed)
Quick Note:  Hgb normal  Good news  Have her do ferritin and CBC 1 week before visit w/ me in Nov ______

## 2014-11-29 ENCOUNTER — Encounter (HOSPITAL_COMMUNITY)
Admission: RE | Admit: 2014-11-29 | Discharge: 2014-11-29 | Disposition: A | Discharge status: Home / Self Care | Payer: PPO | Source: Ambulatory Visit | Attending: Internal Medicine | Admitting: Internal Medicine

## 2014-11-29 ENCOUNTER — Other Ambulatory Visit: Payer: Self-pay

## 2014-11-29 ENCOUNTER — Telehealth: Payer: Self-pay | Admitting: Internal Medicine

## 2014-11-29 ENCOUNTER — Other Ambulatory Visit: Payer: Self-pay | Admitting: *Deleted

## 2014-11-29 DIAGNOSIS — D649 Anemia, unspecified: Secondary | ICD-10-CM

## 2014-11-29 DIAGNOSIS — D509 Iron deficiency anemia, unspecified: Secondary | ICD-10-CM

## 2014-11-29 MED ORDER — SODIUM CHLORIDE 0.9 % IV SOLN
510.0000 mg | INTRAVENOUS | Status: DC
  Filled 2014-11-29: qty 17

## 2014-11-29 NOTE — Telephone Encounter (Signed)
Patient notified see labs 

## 2014-12-03 ENCOUNTER — Encounter (HOSPITAL_COMMUNITY): Payer: PPO

## 2014-12-13 ENCOUNTER — Other Ambulatory Visit: Payer: Self-pay

## 2014-12-13 MED ORDER — PANTOPRAZOLE SODIUM 40 MG PO TBEC: 40.0000 mg | DELAYED_RELEASE_TABLET | Freq: Two times a day (BID) | ORAL | Status: DC

## 2014-12-21 ENCOUNTER — Encounter: Payer: Self-pay | Admitting: *Deleted

## 2014-12-22 ENCOUNTER — Telehealth: Payer: Self-pay | Admitting: Family Medicine

## 2014-12-22 DIAGNOSIS — R52 Pain, unspecified: Secondary | ICD-10-CM

## 2014-12-22 NOTE — Telephone Encounter (Signed)
Relation to LT:YVDP Call back [facility]   Reason for call:  Patient scheduled prolia injection for 12/28/2014. Patient would like to pick up oxyCODONE-acetaminophen (PERCOCET) 10-325 MG per tablet.

## 2014-12-22 NOTE — Telephone Encounter (Signed)
Last filled: 11/15/14 Amt: 120, 0  Last OV: 11/15/14 Contract on file UDS: LOW risk  Pt states she has plenty, but wanted to pick up script when she comes in on 12/28/14, because she's going to [place] and does not want to leave without having enough medication.  Pt states she does not want to be in [place] without medication.    Please advise.

## 2014-12-23 MED ORDER — OXYCODONE-ACETAMINOPHEN 10-325 MG PO TABS: 1.0000 | ORAL_TABLET | ORAL | Status: DC | PRN

## 2014-12-23 NOTE — Telephone Encounter (Signed)
Refill x1 

## 2014-12-23 NOTE — Telephone Encounter (Signed)
Patient aware Rx ready for pick up.      KP 

## 2014-12-28 ENCOUNTER — Other Ambulatory Visit: Payer: Self-pay

## 2014-12-28 ENCOUNTER — Ambulatory Visit (INDEPENDENT_AMBULATORY_CARE_PROVIDER_SITE_OTHER): Payer: PPO

## 2014-12-28 ENCOUNTER — Ambulatory Visit: Payer: PPO

## 2014-12-28 VITALS — BP 124/78 | HR 99 | Resp 14 | Ht 60.0 in | Wt 99.2 lb

## 2014-12-28 DIAGNOSIS — Z0000 Encounter for general adult medical examination without abnormal findings: Secondary | ICD-10-CM | POA: Diagnosis not present

## 2014-12-28 DIAGNOSIS — Z23 Encounter for immunization: Secondary | ICD-10-CM | POA: Diagnosis not present

## 2014-12-28 DIAGNOSIS — R52 Pain, unspecified: Secondary | ICD-10-CM

## 2014-12-28 DIAGNOSIS — M810 Age-related osteoporosis without current pathological fracture: Secondary | ICD-10-CM

## 2014-12-28 MED ORDER — DENOSUMAB 60 MG/ML ~~LOC~~ SOLN
60.0000 mg | Freq: Once | SUBCUTANEOUS | Status: AC
  Administered 2014-12-28: 60 mg via SUBCUTANEOUS

## 2014-12-28 MED ORDER — OXYCODONE-ACETAMINOPHEN 10-325 MG PO TABS: 1.0000 | ORAL_TABLET | ORAL | Status: DC | PRN

## 2014-12-28 NOTE — Patient Instructions (Addendum)
Eat a healthy diet and exercise as tolerated.  Take calcium and vitamin D supplement.   Follow with Dr. Etter Sjogren as scheduled.   Fall Prevention in the Home  Falls can cause injuries. They can happen to people of all ages. There are many things you can do to make your home safe and to help prevent falls.  WHAT CAN I DO ON THE OUTSIDE OF MY HOME?  Regularly fix the edges of walkways and driveways and fix any cracks.  Remove anything that might make you trip as you walk through a door, such as a raised step or threshold.  Trim any bushes or trees on the path to your home.  Use bright outdoor lighting.  Clear any walking paths of anything that might make someone trip, such as rocks or tools.  Regularly check to see if handrails are loose or broken. Make sure that both sides of any steps have handrails.  Any raised decks and porches should have guardrails on the edges.  Have any leaves, snow, or ice cleared regularly.  Use sand or salt on walking paths during winter.  Clean up any spills in your garage right away. This includes oil or grease spills. WHAT CAN I DO IN THE BATHROOM?   Use night lights.  Install grab bars by the toilet and in the tub and shower. Do not use towel bars as grab bars.  Use non-skid mats or decals in the tub or shower.  If you need to sit down in the shower, use a plastic, non-slip stool.  Keep the floor dry. Clean up any water that spills on the floor as soon as it happens.  Remove soap buildup in the tub or shower regularly.  Attach bath mats securely with double-sided non-slip rug tape.  Do not have throw rugs and other things on the floor that can make you trip. WHAT CAN I DO IN THE BEDROOM?  Use night lights.  Make sure that you have a light by your bed that is easy to reach.  Do not use any sheets or blankets that are too big for your bed. They should not hang down onto the floor.  Have a firm chair that has side arms. You can use this  for support while you get dressed.  Do not have throw rugs and other things on the floor that can make you trip. WHAT CAN I DO IN THE KITCHEN?  Clean up any spills right away.  Avoid walking on wet floors.  Keep items that you use a lot in easy-to-reach places.  If you need to reach something above you, use a strong step stool that has a grab bar.  Keep electrical cords out of the way.  Do not use floor polish or wax that makes floors slippery. If you must use wax, use non-skid floor wax.  Do not have throw rugs and other things on the floor that can make you trip. WHAT CAN I DO WITH MY STAIRS?  Do not leave any items on the stairs.  Make sure that there are handrails on both sides of the stairs and use them. Fix handrails that are broken or loose. Make sure that handrails are as long as the stairways.  Check any carpeting to make sure that it is firmly attached to the stairs. Fix any carpet that is loose or worn.  Avoid having throw rugs at the top or bottom of the stairs. If you do have throw rugs, attach them to the  floor with carpet tape.  Make sure that you have a light switch at the top of the stairs and the bottom of the stairs. If you do not have them, ask someone to add them for you. WHAT ELSE CAN I DO TO HELP PREVENT FALLS?  Wear shoes that:  Do not have high heels.  Have rubber bottoms.  Are comfortable and fit you well.  Are closed at the toe. Do not wear sandals.  If you use a stepladder:  Make sure that it is fully opened. Do not climb a closed stepladder.  Make sure that both sides of the stepladder are locked into place.  Ask someone to hold it for you, if possible.  Clearly mark and make sure that you can see:  Any grab bars or handrails.  First and last steps.  Where the edge of each step is.  Use tools that help you move around (mobility aids) if they are needed. These include:  Canes.  Walkers.  Scooters.  Crutches.  Turn on the  lights when you go into a dark area. Replace any light bulbs as soon as they burn out.  Set up your furniture so you have a clear path. Avoid moving your furniture around.  If any of your floors are uneven, fix them.  If there are any pets around you, be aware of where they are.  Review your medicines with your doctor. Some medicines can make you feel dizzy. This can increase your chance of falling. Ask your doctor what other things that you can do to help prevent falls.   This information is not intended to replace advice given to you by your health care provider. Make sure you discuss any questions you have with your health care provider.   Document Released: 12/30/2008 Document Revised: 07/20/2014 Document Reviewed: 04/09/2014 Elsevier Interactive Patient Education 2016 Amber Maintenance, Female Adopting a healthy lifestyle and getting preventive care can go a long way to promote health and wellness. Talk with your health care provider about what schedule of regular examinations is right for you. This is a good chance for you to check in with your provider about disease prevention and staying healthy. In between checkups, there are plenty of things you can do on your own. Experts have done a lot of research about which lifestyle changes and preventive measures are most likely to keep you healthy. Ask your health care provider for more information. WEIGHT AND DIET  Eat a healthy diet  Be sure to include plenty of vegetables, fruits, low-fat dairy products, and lean protein.  Do not eat a lot of foods high in solid fats, added sugars, or salt.  Get regular exercise. This is one of the most important things you can do for your health.  Most adults should exercise for at least 150 minutes each week. The exercise should increase your heart rate and make you sweat (moderate-intensity exercise).  Most adults should also do strengthening exercises at least twice a week. This  is in addition to the moderate-intensity exercise.  Maintain a healthy weight  Body mass index (BMI) is a measurement that can be used to identify possible weight problems. It estimates body fat based on height and weight. Your health care provider can help determine your BMI and help you achieve or maintain a healthy weight.  For females 20 years of age and older:   A BMI below 18.5 is considered underweight.  A BMI of 18.5 to 24.9 is  normal.  A BMI of 25 to 29.9 is considered overweight.  A BMI of 30 and above is considered obese.  Watch levels of cholesterol and blood lipids  You should start having your blood tested for lipids and cholesterol at 70 years of age, then have this test every 5 years.  You may need to have your cholesterol levels checked more often if:  Your lipid or cholesterol levels are high.  You are older than 70 years of age.  You are at high risk for heart disease.  CANCER SCREENING   Lung Cancer  Lung cancer screening is recommended for adults 16-73 years old who are at high risk for lung cancer because of a history of smoking.  A yearly low-dose CT scan of the lungs is recommended for people who:  Currently smoke.  Have quit within the past 15 years.  Have at least a 30-pack-year history of smoking. A pack year is smoking an average of one pack of cigarettes a day for 1 year.  Yearly screening should continue until it has been 15 years since you quit.  Yearly screening should stop if you develop a health problem that would prevent you from having lung cancer treatment.  Breast Cancer  Practice breast self-awareness. This means understanding how your breasts normally appear and feel.  It also means doing regular breast self-exams. Let your health care provider know about any changes, no matter how small.  If you are in your 20s or 30s, you should have a clinical breast exam (CBE) by a health care provider every 1-3 years as part of a  regular health exam.  If you are 35 or older, have a CBE every year. Also consider having a breast X-ray (mammogram) every year.  If you have a family history of breast cancer, talk to your health care provider about genetic screening.  If you are at high risk for breast cancer, talk to your health care provider about having an MRI and a mammogram every year.  Breast cancer gene (BRCA) assessment is recommended for women who have family members with BRCA-related cancers. BRCA-related cancers include:  Breast.  Ovarian.  Tubal.  Peritoneal cancers.  Results of the assessment will determine the need for genetic counseling and BRCA1 and BRCA2 testing. Cervical Cancer Your health care provider may recommend that you be screened regularly for cancer of the pelvic organs (ovaries, uterus, and vagina). This screening involves a pelvic examination, including checking for microscopic changes to the surface of your cervix (Pap test). You may be encouraged to have this screening done every 3 years, beginning at age 28.  For women ages 61-65, health care providers may recommend pelvic exams and Pap testing every 3 years, or they may recommend the Pap and pelvic exam, combined with testing for human papilloma virus (HPV), every 5 years. Some types of HPV increase your risk of cervical cancer. Testing for HPV may also be done on women of any age with unclear Pap test results.  Other health care providers may not recommend any screening for nonpregnant women who are considered low risk for pelvic cancer and who do not have symptoms. Ask your health care provider if a screening pelvic exam is right for you.  If you have had past treatment for cervical cancer or a condition that could lead to cancer, you need Pap tests and screening for cancer for at least 20 years after your treatment. If Pap tests have been discontinued, your risk factors (such as  having a new sexual partner) need to be reassessed to  determine if screening should resume. Some women have medical problems that increase the chance of getting cervical cancer. In these cases, your health care provider may recommend more frequent screening and Pap tests. Colorectal Cancer  This type of cancer can be detected and often prevented.  Routine colorectal cancer screening usually begins at 70 years of age and continues through 70 years of age.  Your health care provider may recommend screening at an earlier age if you have risk factors for colon cancer.  Your health care provider may also recommend using home test kits to check for hidden blood in the stool.  A small camera at the end of a tube can be used to examine your colon directly (sigmoidoscopy or colonoscopy). This is done to check for the earliest forms of colorectal cancer.  Routine screening usually begins at age 86.  Direct examination of the colon should be repeated every 5-10 years through 70 years of age. However, you may need to be screened more often if early forms of precancerous polyps or small growths are found. Skin Cancer  Check your skin from head to toe regularly.  Tell your health care provider about any new moles or changes in moles, especially if there is a change in a mole's shape or color.  Also tell your health care provider if you have a mole that is larger than the size of a pencil eraser.  Always use sunscreen. Apply sunscreen liberally and repeatedly throughout the day.  Protect yourself by wearing long sleeves, pants, a wide-brimmed hat, and sunglasses whenever you are outside. HEART DISEASE, DIABETES, AND HIGH BLOOD PRESSURE   High blood pressure causes heart disease and increases the risk of stroke. High blood pressure is more likely to develop in:  People who have blood pressure in the high end of the normal range (130-139/85-89 mm Hg).  People who are overweight or obese.  People who are African American.  If you are 18-39 years of  age, have your blood pressure checked every 3-5 years. If you are 48 years of age or older, have your blood pressure checked every year. You should have your blood pressure measured twice--once when you are at a hospital or clinic, and once when you are not at a hospital or clinic. Record the average of the two measurements. To check your blood pressure when you are not at a hospital or clinic, you can use:  An automated blood pressure machine at a pharmacy.  A home blood pressure monitor.  If you are between 35 years and 60 years old, ask your health care provider if you should take aspirin to prevent strokes.  Have regular diabetes screenings. This involves taking a blood sample to check your fasting blood sugar level.  If you are at a normal weight and have a low risk for diabetes, have this test once every three years after 70 years of age.  If you are overweight and have a high risk for diabetes, consider being tested at a younger age or more often. PREVENTING INFECTION  Hepatitis B  If you have a higher risk for hepatitis B, you should be screened for this virus. You are considered at high risk for hepatitis B if:  You were born in a country where hepatitis B is common. Ask your health care provider which countries are considered high risk.  Your parents were born in a high-risk country, and you have not  been immunized against hepatitis B (hepatitis B vaccine).  You have HIV or AIDS.  You use needles to inject street drugs.  You live with someone who has hepatitis B.  You have had sex with someone who has hepatitis B.  You get hemodialysis treatment.  You take certain medicines for conditions, including cancer, organ transplantation, and autoimmune conditions. Hepatitis C  Blood testing is recommended for:  Everyone born from [birth year] through 1966.  Anyone with known risk factors for hepatitis C. Sexually transmitted infections (STIs)  You should be screened for sexually  transmitted infections (STIs) including gonorrhea and chlamydia if:  You are sexually active and are younger than 70 years of age.  You are older than 70 years of age and your health care provider tells you that you are at risk for this type of infection.  Your sexual activity has changed since you were last screened and you are at an increased risk for chlamydia or gonorrhea. Ask your health care provider if you are at risk.  If you do not have HIV, but are at risk, it may be recommended that you take a prescription medicine daily to prevent HIV infection. This is called pre-exposure prophylaxis (PrEP). You are considered at risk if:  You are sexually active and do not regularly use condoms or know the HIV status of your partner(s).  You take drugs by injection.  You are sexually active with a partner who has HIV. Talk with your health care provider about whether you are at high risk of being infected with HIV. If you choose to begin PrEP, you should first be tested for HIV. You should then be tested every 3 months for as long as you are taking PrEP.  PREGNANCY   If you are premenopausal and you may become pregnant, ask your health care provider about preconception counseling.  If you may become pregnant, take 400 to 800 micrograms (mcg) of folic acid every day.  If you want to prevent pregnancy, talk to your health care provider about birth control (contraception). OSTEOPOROSIS AND MENOPAUSE   Osteoporosis is a disease in which the bones lose minerals and strength with aging. This can result in serious bone fractures. Your risk for osteoporosis can be identified using a bone density scan.  If you are 28 years of age or older, or if you are at risk for osteoporosis and fractures, ask your health care provider if you should be screened.  Ask your health care provider whether you should take a calcium or vitamin D supplement to lower your risk for osteoporosis.  Menopause may have  certain physical symptoms and risks.  Hormone replacement therapy may reduce some of these symptoms and risks. Talk to your health care provider about whether hormone replacement therapy is right for you.  HOME CARE INSTRUCTIONS   Schedule regular health, dental, and eye exams.  Stay current with your immunizations.   Do not use any tobacco products including cigarettes, chewing tobacco, or electronic cigarettes.  If you are pregnant, do not drink alcohol.  If you are breastfeeding, limit how much and how often you drink alcohol.  Limit alcohol intake to no more than 1 drink per day for nonpregnant women. One drink equals 12 ounces of beer, 5 ounces of wine, or 1 ounces of hard liquor.  Do not use street drugs.  Do not share needles.  Ask your health care provider for help if you need support or information about quitting drugs.  Tell your health  care provider if you often feel depressed.  Tell your health care provider if you have ever been abused or do not feel safe at home.   This information is not intended to replace advice given to you by your health care provider. Make sure you discuss any questions you have with your health care provider.   Document Released: 09/18/2010 Document Revised: 03/26/2014 Document Reviewed: 02/04/2013 Elsevier Interactive Patient Education [employer].

## 2014-12-28 NOTE — Progress Notes (Signed)
reviewed

## 2014-12-28 NOTE — Progress Notes (Signed)
Pre visit review using our clinic review tool, if applicable. No additional management support is needed unless otherwise documented below in the visit note. 

## 2014-12-28 NOTE — Progress Notes (Signed)
Subjective:   [name] is a 69 y.o. female who presents for an Initial Medicare Annual Wellness Visit.  Review of Systems: No ROS  Sleep patterns:  Sleeps pattern varies.  Has trouble sleeping at night.  Wakes up at least once or twice at night to go to the bathroom.   Home Safety/Smoke Alarms:  Feels safe at home.  Lives at home with husband and 3 children.  Smoke alarms present.   Firearm Safety:  Firearm kept in a safe place.   Seat Belt Safety/Bike Helmet:  Always wears seat belt.    Counseling:   Eye Exam- Jan 2016 Dental- Yearly  Female:  Pap- total hysterectomy      Mammo- 09/14/14- normal     Dexa scan- 10/22/11-osteoporosis      [facility]-No longer receive colonoscopies.  Flex Sig 12/18/10-DrOlevia Perches, Plans to see Dr. Carlean Purl   Immunization: UTD.  Pt given Flu vaccine today during visit.       Objective:    Today's Vitals   12/28/14 1509  BP: 124/78  Pulse: 99  Resp: 14  Height: 5' (1.524 m)  Weight: 99 lb 3.2 oz (44.997 kg)  SpO2: 96%  PainSc: 6   PainLoc: Back    Current Medications (verified) Outpatient Encounter Prescriptions as of 12/28/2014  Medication Sig  . albuterol (PROAIR HFA) 108 (90 BASE) MCG/ACT inhaler Inhale 2 puffs into the lungs every 6 (six) hours as needed for wheezing or shortness of breath.  . AMBULATORY NON FORMULARY MEDICATION Pt takes Oxygen, 2 units, every day  . calcium carbonate (OS-CAL - DOSED IN MG OF ELEMENTAL CALCIUM) 1250 (500 CA) MG tablet Take 2 tablets (1,000 mg of elemental calcium total) by mouth 3 (three) times daily with meals.  . carvedilol (COREG) 6.25 MG tablet Take 1 tablet (6.25 mg total) by mouth 2 (two) times daily with a meal.  . furosemide (LASIX) 40 MG tablet TAKE ONE TABLET BY MOUTH IN THE MORNING  . gabapentin (NEURONTIN) 600 MG tablet TAKE ONE TABLET BY MOUTH 4 TIMES DAILY  . loperamide (IMODIUM A-D) 2 MG tablet Take 1/2 tablet by mouth daily as needed for diarrhea.  [name] MYRBETRIQ 25 MG TB24 tablet Take 1 tablet  by mouth daily.  . nitroGLYCERIN (NITROSTAT) 0.4 MG SL tablet Place 0.4 mg under the tongue every 5 (five) minutes as needed. CHEST PAIN  . pantoprazole (PROTONIX) 40 MG tablet Take 1 tablet (40 mg total) by mouth 2 (two) times daily.  . promethazine (PHENERGAN) 25 MG tablet TAKE ONE TABLET BY MOUTH EVERY 8 HOURS AS NEEDED FOR NAUSEA  . tiotropium (SPIRIVA HANDIHALER) 18 MCG inhalation capsule Place 1 capsule (18 mcg total) into inhaler and inhale every morning.  [name] tiZANidine (ZANAFLEX) 4 MG tablet Take 1 tablet (4 mg total) by mouth 3 (three) times daily as needed. for muscle spams. DON'T FILL UNTIL 12/02/14  . zolpidem (AMBIEN) 5 MG tablet Take 1 tablet (5 mg total) by mouth at bedtime as needed. DON'T FILL UNTIL 12/02/14  . [DISCONTINUED] oxyCODONE-acetaminophen (PERCOCET) 10-325 MG tablet Take 1 tablet by mouth every 4 (four) hours as needed.  . [DISCONTINUED] food thickener (RESOURCE THICKENUP CLEAR) POWD Take 120 g by mouth 3 (three) times daily. (Patient not taking: Reported on 12/28/2014)  . [EXPIRED] denosumab (PROLIA) injection 60 mg    No facility-administered encounter medications on file as of 12/28/2014.    Allergies (verified) Dabigatran etexilate mesylate; Talwin; Pentazocine lactate; and Sulfonamide derivatives   History: Past Medical  History  Diagnosis Date  . GERD (gastroesophageal reflux disease)   . CAD (coronary artery disease)     mild disease per cath in 2011  . Venous embolism and thrombosis of subclavian vein ([place])     after pacemaker insertion Oct 2011  . Pleural effusion      s/p right thoracentesis 03/09  . Small bowel obstruction ([place])   . Urinary incontinence      -INTERSTIM IMPLANT NOT FUNCTIONING PER PT  . RLS (restless legs syndrome)   . Primary dilated cardiomyopathy (HCC)     EF 45 to 50% per echo in Jan 2012  . Vitamin B12 deficiency   . Gastroparesis   . Chronic nausea   . Chronic abdominal pain   . Chronic pain syndrome   . Pacemaker     CRT  therapy; followed by Dr. Caryl Comes  . H/O: GI bleed     from Pradaxa  . Oxygen dependent     2 liters via nasal cannula at all times  . CHF (congestive heart failure) ([place])   . Anemia   . Hx of stress fx aug 2011    right hip   . Elbow fracture, left aug 2012  . Arthritis     right hip  . Hx of gastric ulcer   . Diverticulitis   . HTN (hypertension)     under control; has been on med. x "years"  . Hx MRSA infection   . Pneumonia - 03/09, 12/09, 02/10, 06/10    MOST RECENT FEB 2013  . Elevated liver enzymes   . Blood transfusion   . Anginal pain ([place])   . Fibromyalgia     COUPLE OF TIMES A YEAR-PT HAS EPISODES OF CONFUSION-USUALLY INVOLVES DAY/NIGHT REVERSAL AND EPISODES OF TWITCHING AND FALLS--SEES DR. Jacelyn Grip - NEUROLOGIST-LAST SEEN 07/10/11  . LBBB (left bundle branch block)   . Atrial fibrillation ([place])   . COPD (chronic obstructive pulmonary disease) (HCC)     continuous O2- 2l  . Hepatitis     hx of in high school   . Silent aspiration    Past Surgical History  Procedure Laterality Date  . Appendectomy    . Pacemaker placement  12/21/2009  . Peg removed      with complications  . Tonsillectomy  age 83  . Interstim implant revision  03/06/2011    Procedure: REVISION OF [name];  Surgeon: Reece Packer, MD;  Location: [facility];  Service: Urology;  Laterality: N/A;  Replacement of Neurostimulator  . Portacath placement  12/16/2009  . Total abdominal hysterectomy      complete  . Interstim implant placement  05/28/2006 - stage I    06/05/2006 - stage II  . Interstim implant revision  10/23/2007  . Cystoscopy with injection  04/30/2006    transurethral collagen injection; incision vaginal stenosis  . Small intestine surgery  05/20/2001    ex. lap., resection of small bowel stricture; gastrostomy; insertion central line  . Cardiac catheterization  04/08/2006, 11/16/2009  . Cystoscopy  11/11/2008  . Cystoscopy w/ retrogrades  11/11/2008    right  . Colectomy      Intestinal  resection (5times)  . Colectomy  02/04/2000    ex. lap., intra-abd. subtotal colectomy with ileosigmoid colon anastomosies and lysis of adhesions  . Gastrocutaneous fistula closure    . Exploratory laparotomy  04/27/2009    lysis of adhesions, gastrostomy tube  . Port-a-cath removal  07/27/2011    Procedure: REMOVAL PORT-A-CATH;  Surgeon:  Rolm Bookbinder, MD;  Location: [place] [facility];  Service: General;  Laterality: N/A;  . Total hip arthroplasty  08/03/2011    Procedure: TOTAL HIP ARTHROPLASTY ANTERIOR APPROACH;  Surgeon: Mcarthur Rossetti, MD;  Location: [facility];  Service: Orthopedics;  Laterality: Right;  . Interstim implant placement  02/05/2012    Procedure: INTERSTIM IMPLANT FIRST STAGE;  Surgeon: Reece Packer, MD;  Location: [facility];  Service: Urology;  Laterality: N/A;  Replacement of Interstim Lead     . Esophagogastroduodenoscopy N/A 02/02/2013    Procedure: ESOPHAGOGASTRODUODENOSCOPY (EGD);  Surgeon: Lafayette Dragon, MD;  Location: [facility];  Service: Endoscopy;  Laterality: N/A;  . Savory dilation N/A 02/02/2013    Procedure: SAVORY DILATION;  Surgeon: Lafayette Dragon, MD;  Location: [facility];  Service: Endoscopy;  Laterality: N/A;   Family History  Problem Relation Age of Onset  . Heart disease Father   . Stroke Mother   . Heart disease Mother   . Colon cancer Neg Hx   . Heart disease Brother   . Breast cancer Maternal Aunt   . Heart disease Paternal Aunt   . Diabetes Paternal Grandfather    Social History   Occupational History  . disabled    Social History Main Topics  . Smoking status: Former Smoker -- 1.00 packs/day for 20 years    Quit date: 03/19/1986  . Smokeless tobacco: Never Used  . Alcohol Use: No  . Drug Use: No  . Sexual Activity: Yes    Tobacco Counseling Counseling given: Yes   Activities of Daily Living In your present state of health, do you have any difficulty performing the following activities: 12/28/2014  05/17/2014  Hearing? Y N  Vision? N N  Difficulty concentrating or making decisions? N N  Walking or climbing stairs? Y N  Dressing or bathing? N N  Doing errands, shopping? N N  Preparing Food and eating ? N -  Using the Toilet? N -  In the past six months, have you accidently leaked urine? Y -  Do you have problems with loss of bowel control? N -  Managing your Medications? N -  Managing your Finances? N -  Housekeeping or managing your Housekeeping? N -    Immunizations and Health Maintenance Immunization History  Administered Date(s) Administered  . Influenza Split 01/16/2011, 12/12/2011  . Influenza Whole 01/15/2007, 12/08/2007, 12/28/2008, 12/17/2009  . Influenza, Seasonal, Injecte, Preservative Fre 12/09/2012  . Influenza,inj,Quad PF,36+ Mos 12/01/2013, 12/28/2014  . Pneumococcal Conjugate-13 06/10/2014, 11/15/2014  . Pneumococcal Polysaccharide-23 12/17/2009  . Tdap 05/15/2014  . Zoster 12/31/2013   Health Maintenance Due  Topic Date Due  . Hepatitis C Screening  March 18, 1946    Patient Care Team: Rosalita Chessman, DO as PCP - General Rolm Bookbinder, MD as Consulting Physician (General Surgery) Lafayette Dragon, MD as Consulting Physician (Gastroenterology) Gatha Mayer, MD as Consulting Physician (Gastroenterology) Rutherford Guys, MD as Consulting Physician (Ophthalmology) Crista Luria, MD as Consulting Physician (Dermatology) Deboraha Sprang, MD as Consulting Physician (Cardiology) Minus Breeding, MD as Consulting Physician (Cardiology)  Indicate any recent Medical Services you may have received from other than [facility] providers in the past year (date may be approximate).     Assessment:   This is a routine wellness examination for [name].   Osteoporosis- Taking Prolia injections.  Prolia injection given today during visit.  Encouraged weight bearing exercise, resistance training, and calcium and vitamin D supplementation.  Discussed fall prevention.    COPD  with  emphysema- Asymptomatic today. On O2 @ 2L [place] .  Take medication as prescribed.    Hearing/Vision screen  Hearing Screening   '125Hz'$  '250Hz'$  '500Hz'$  '1000Hz'$  '2000Hz'$  '4000Hz'$  '8000Hz'$   Right ear:         Left ear:   Pass Pass Pass Pass   Comments: No changes in hearing.   Hearing screening on right ear: Unable to hear sounds using Universal Hearing Screener.    Vision Screening Comments: Last eye exam: Jan 2016- Dr. Gershon Crane  Dietary issues and exercise activities discussed: Current Exercise Habits:: The patient does not participate in regular exercise at present   Diet: 2 light meals per day. Snacks- cookie and chex mix.   Breakfast-bowl of cereal; Lunch- ham sandwich with chips or soup; Dinner:  Meat and vegetables.  No longer drinks Resource.    Goals    . Increase physical activity     Thinking of getting a treadmill.      . Stay active      Taking care of her family.  Cleaning home.         Depression Screen PHQ 2/9 Scores 12/28/2014 11/15/2014 07/20/2013 07/07/2013  PHQ - 2 Score 0 0 0 0    Fall Risk Fall Risk  12/28/2014 11/15/2014 07/20/2013 07/07/2013  Falls in the past year? Yes Yes No No  Number falls in past yr: 2 or more 2 or more - -  Injury with Fall? Yes Yes - -  Risk for fall due to : Impaired balance/gait - - -  Risk for fall due to (comments): Pt states she moves too fast for age sometimes - - -  Follow up Education provided;Falls prevention discussed - - -    Cognitive Function: MMSE - Mini Mental State Exam 12/28/2014  Orientation to time 5  Orientation to Place 5  Registration 3  Attention/ Calculation 5  Recall 3  Language- name 2 objects 2  Language- repeat 1  Language- follow 3 step command 3  Language- read & follow direction 1  Write a sentence 1  Copy design 1  Total score 30    Screening Tests Health Maintenance  Topic Date Due  . Hepatitis C Screening  Dec 07, 1945  . COLONOSCOPY  03/18/2015 (Originally 12/12/2008)  . INFLUENZA VACCINE  10/18/2015   . MAMMOGRAM  09/13/2016  . TETANUS/TDAP  05/15/2024  . DEXA SCAN  Completed  . ZOSTAVAX  Completed  . PNA vac Low Risk Adult  Completed      Plan:  Eat a healthy diet and exercise as tolerated.   Take calcium and vitamin D supplement.    Follow with Dr. Etter Sjogren as scheduled.   During the course of the visit, [name] was educated and counseled about the following appropriate screening and preventive services:   Vaccines to include Pneumoccal, Influenza, Hepatitis B, Td, Zostavax, HCV  Electrocardiogram  Cardiovascular disease screening  Colorectal cancer screening  Bone density screening  Diabetes screening  Glaucoma screening  Mammography/PAP  Nutrition counseling  Smoking cessation counseling  Patient Instructions (the written plan) were given to the patient.    Rudene Anda, RN   12/28/2014

## 2014-12-29 ENCOUNTER — Other Ambulatory Visit: Payer: Self-pay | Admitting: Family Medicine

## 2014-12-29 NOTE — Telephone Encounter (Signed)
Last seen and filled 11/15/14 #30 UDS 08/09/14 low risk.   Please advise    KP

## 2014-12-31 ENCOUNTER — Other Ambulatory Visit: Payer: Self-pay | Admitting: Family Medicine

## 2015-01-20 ENCOUNTER — Ambulatory Visit (INDEPENDENT_AMBULATORY_CARE_PROVIDER_SITE_OTHER): Payer: PPO | Admitting: Internal Medicine

## 2015-01-20 ENCOUNTER — Other Ambulatory Visit (INDEPENDENT_AMBULATORY_CARE_PROVIDER_SITE_OTHER): Payer: PPO

## 2015-01-20 ENCOUNTER — Encounter: Payer: Self-pay | Admitting: Internal Medicine

## 2015-01-20 ENCOUNTER — Other Ambulatory Visit: Payer: Self-pay | Admitting: Internal Medicine

## 2015-01-20 VITALS — BP 134/70 | HR 72 | Ht 60.5 in | Wt 104.0 lb

## 2015-01-20 DIAGNOSIS — D638 Anemia in other chronic diseases classified elsewhere: Secondary | ICD-10-CM | POA: Diagnosis not present

## 2015-01-20 DIAGNOSIS — K5669 Other intestinal obstruction: Secondary | ICD-10-CM

## 2015-01-20 DIAGNOSIS — D649 Anemia, unspecified: Secondary | ICD-10-CM

## 2015-01-20 DIAGNOSIS — K56609 Unspecified intestinal obstruction, unspecified as to partial versus complete obstruction: Secondary | ICD-10-CM

## 2015-01-20 LAB — CBC WITH DIFFERENTIAL/PLATELET
Basophils Absolute: 0 10*3/uL (ref 0.0–0.1)
Basophils Relative: 0.6 % (ref 0.0–3.0)
Eosinophils Absolute: 0.2 10*3/uL (ref 0.0–0.7)
Eosinophils Relative: 3.1 % (ref 0.0–5.0)
HCT: 38.2 % (ref 36.0–46.0)
Hemoglobin: 12.4 g/dL (ref 12.0–15.0)
Lymphocytes Relative: 10.2 % — ABNORMAL LOW (ref 12.0–46.0)
Lymphs Abs: 0.7 10*3/uL (ref 0.7–4.0)
MCHC: 32.4 g/dL (ref 30.0–36.0)
MCV: 85.2 fl (ref 78.0–100.0)
Monocytes Absolute: 0.8 10*3/uL (ref 0.1–1.0)
Monocytes Relative: 10.5 % (ref 3.0–12.0)
Neutro Abs: 5.4 10*3/uL (ref 1.4–7.7)
Neutrophils Relative %: 75.6 % (ref 43.0–77.0)
Platelets: 296 10*3/uL (ref 150.0–400.0)
RBC: 4.49 Mil/uL (ref 3.87–5.11)
RDW: 14.3 % (ref 11.5–15.5)
WBC: 7.2 10*3/uL (ref 4.0–10.5)

## 2015-01-20 LAB — FERRITIN: Ferritin: 193.4 ng/mL (ref 10.0–291.0)

## 2015-01-20 NOTE — Patient Instructions (Signed)
  Please go to the lab before leaving and have your lab work drawn.   Follow up with Dr Carlean Purl in 6 months.     I appreciate the opportunity to care for you. Silvano Rusk, MD, [facility]

## 2015-01-20 NOTE — Progress Notes (Signed)
   Subjective:    Patient ID: [name], female    DOB: January 28, 1946, 69 y.o.   MRN: [id number] Cc: anemia HPI [facility] previously followed by Dr. Olevia Perches. She has chronic recurrent small bowel obstructions, she frequently manages at home by using liquid diet and anti-emetics. Occasionally requires admission. Had been Rxed w/ feraheme - CBC and iron studies now ok except sats look like chronic disease anemia. Medications, allergies, past medical history, past surgical history, family history and social history are reviewed and updated in the EMR.   Review of Systems As above Chronic dyspnea/COPD on O2     Objective:   Physical Exam BP 134/70 mmHg  Pulse 72  Ht 5' 0.5" (1.537 m)  Wt 104 lb (47.174 kg)  BMI 19.97 kg/m2 Chron ill NAD On o2  Lab Results  Component Value Date   FERRITIN 193.4 01/20/2015   Lab Results  Component Value Date   WBC 7.2 01/20/2015   HGB 12.4 01/20/2015   HCT 38.2 01/20/2015   MCV 85.2 01/20/2015   PLT 296.0 01/20/2015         Assessment & Plan:  Anemia of chronic disease  Small bowel obstruction (HCC)  Continue current Rx, she is ok currently Serial f/u Hgb and ferritin in 3 months See me routine visit 6 months, sooner orn

## 2015-01-20 NOTE — Progress Notes (Signed)
Quick Note:  Labs ok ______ 

## 2015-01-23 ENCOUNTER — Encounter: Payer: Self-pay | Admitting: Internal Medicine

## 2015-01-24 ENCOUNTER — Telehealth: Payer: Self-pay | Admitting: Pulmonary Disease

## 2015-01-24 NOTE — Telephone Encounter (Signed)
I have completed form stating that she can start treatment with methotrexate to treat her plaque psoriasis.

## 2015-01-24 NOTE — Telephone Encounter (Signed)
I called made pt aware of below.  I have faxed form back to Dr. Armanda Magic office and placed in [facility] scan. nothing further needed

## 2015-01-24 NOTE — Telephone Encounter (Signed)
Spoke with [name]. She reports she is needing to start on oral medication from Dr. Renda Rolls for her psoriasis. She reports but she needs approval from [facility]. She does not know the name of the medication but is going to have Dr. Renda Rolls fax over some information. Dr. Halford Chessman, have you received anything on [name]? thanks

## 2015-01-31 ENCOUNTER — Other Ambulatory Visit: Payer: Self-pay | Admitting: Cardiology

## 2015-01-31 ENCOUNTER — Other Ambulatory Visit: Payer: Self-pay | Admitting: Family Medicine

## 2015-01-31 NOTE — Telephone Encounter (Signed)
Last seen 11/15/14 and filled Ambien 12/30/14 #30   Please advise     KP

## 2015-02-01 ENCOUNTER — Other Ambulatory Visit: Payer: Self-pay

## 2015-02-01 NOTE — Telephone Encounter (Signed)
Left message for pt that medications were sent to the pharmacy.

## 2015-02-01 NOTE — Telephone Encounter (Signed)
Patient leaving to go out of town in early in the morning she needs a refill for [place] and zanaflex sent to her pharmacy [facility] [place], [place] [phone]

## 2015-02-15 ENCOUNTER — Other Ambulatory Visit: Payer: Self-pay | Admitting: Family Medicine

## 2015-02-15 DIAGNOSIS — R52 Pain, unspecified: Secondary | ICD-10-CM

## 2015-02-15 MED ORDER — OXYCODONE-ACETAMINOPHEN 10-325 MG PO TABS: 1.0000 | ORAL_TABLET | ORAL | Status: DC | PRN

## 2015-02-15 NOTE — Telephone Encounter (Signed)
Pt is requesting refill on Oxycodone. Lowne Pt.  Last OV: 11/15/2014 Last Fill: 12/28/2014 #120 and 0RF Pt sig: 1 tablet q4h PRN UDS: 08/09/2014 Low risk  Please advise.

## 2015-02-15 NOTE — Telephone Encounter (Signed)
Pt needing refill on oxycodone. She has 15 left and takes 3-4 most days. Please call when RX ready for pick up [phone].

## 2015-02-15 NOTE — Telephone Encounter (Signed)
Rx placed at front desk for pick up and pt has been notified. 

## 2015-02-15 NOTE — Telephone Encounter (Signed)
Rx printed and forwarded to [facility] for signature.

## 2015-02-15 NOTE — Telephone Encounter (Signed)
PCP out of town, please print #120, no refills

## 2015-02-16 ENCOUNTER — Other Ambulatory Visit: Payer: Self-pay | Admitting: Family Medicine

## 2015-02-16 NOTE — Telephone Encounter (Signed)
Last filled: 01/31/15 Amt: 30, 0 Last OV:  12/28/14-AWV Contract on file. UDS: LOW risk  Please advise

## 2015-02-18 ENCOUNTER — Telehealth: Payer: Self-pay

## 2015-02-18 NOTE — Telephone Encounter (Signed)
Per pharmacy they have not received faxs sent regarding refill on patients Ambien. Verbally gave Rx over the phone to pharmacy.

## 2015-03-03 ENCOUNTER — Ambulatory Visit: Payer: PPO | Admitting: Family Medicine

## 2015-03-04 ENCOUNTER — Ambulatory Visit (INDEPENDENT_AMBULATORY_CARE_PROVIDER_SITE_OTHER): Payer: PPO | Admitting: Family Medicine

## 2015-03-04 ENCOUNTER — Encounter: Payer: Self-pay | Admitting: Family Medicine

## 2015-03-04 VITALS — BP 124/79 | HR 68 | Temp 98.7°F | Ht 61.0 in | Wt 104.8 lb

## 2015-03-04 DIAGNOSIS — G4700 Insomnia, unspecified: Secondary | ICD-10-CM

## 2015-03-04 DIAGNOSIS — R52 Pain, unspecified: Secondary | ICD-10-CM | POA: Diagnosis not present

## 2015-03-04 MED ORDER — TIZANIDINE HCL 4 MG PO TABS: 4.0000 mg | ORAL_TABLET | Freq: Three times a day (TID) | ORAL | Status: DC | PRN

## 2015-03-04 MED ORDER — ZOLPIDEM TARTRATE 5 MG PO TABS: 5.0000 mg | ORAL_TABLET | Freq: Every evening | ORAL | Status: DC | PRN

## 2015-03-04 MED ORDER — OXYCODONE-ACETAMINOPHEN 10-325 MG PO TABS: 1.0000 | ORAL_TABLET | ORAL | Status: DC | PRN

## 2015-03-04 NOTE — Progress Notes (Signed)
Pre visit review using our clinic review tool, if applicable. No additional management support is needed unless otherwise documented below in the visit note. 

## 2015-03-04 NOTE — Patient Instructions (Signed)

## 2015-03-04 NOTE — Progress Notes (Signed)
Patient ID: [name], female    DOB: 05-22-1945  Age: 69 y.o. MRN: [id number]    Subjective:  Subjective HPI NIOMIE ENGLERT presents for f/u pain.  No complaints.   Review of Systems  Constitutional: Negative for diaphoresis, appetite change, fatigue and unexpected weight change.  Eyes: Negative for pain, redness and visual disturbance.  Respiratory: Negative for cough, chest tightness, shortness of breath and wheezing.   Cardiovascular: Negative for chest pain, palpitations and leg swelling.  Endocrine: Negative for cold intolerance, heat intolerance, polydipsia, polyphagia and polyuria.  Genitourinary: Negative for dysuria, frequency and difficulty urinating.  Neurological: Negative for dizziness, light-headedness, numbness and headaches.    History Past Medical History  Diagnosis Date  . GERD (gastroesophageal reflux disease)   . CAD (coronary artery disease)     mild disease per cath in 2011  . Venous embolism and thrombosis of subclavian vein ([place])     after pacemaker insertion Oct 2011  . Pleural effusion      s/p right thoracentesis 03/09  . Small bowel obstruction ([name])   . Urinary incontinence      -INTERSTIM IMPLANT NOT FUNCTIONING PER PT  . RLS (restless legs syndrome)   . Primary dilated cardiomyopathy (HCC)     EF 45 to 50% per echo in Jan 2012  . Vitamin B12 deficiency   . Gastroparesis   . Chronic nausea   . Chronic abdominal pain   . Chronic pain syndrome   . Pacemaker     CRT therapy; followed by Dr. Caryl Comes  . H/O: GI bleed     from Pradaxa  . Oxygen dependent     2 liters via nasal cannula at all times  . CHF (congestive heart failure) ([place])   . Anemia   . Hx of stress fx aug 2011    right hip   . Elbow fracture, left aug 2012  . Arthritis     right hip  . Hx of gastric ulcer   . Diverticulitis   . HTN (hypertension)     under control; has been on med. x "years"  . Hx MRSA infection   . Pneumonia - 03/09, 12/09, 02/10, 06/10    MOST  RECENT FEB 2013  . Elevated liver enzymes   . Blood transfusion   . Anginal pain ([place])   . Fibromyalgia     COUPLE OF TIMES A YEAR-PT HAS EPISODES OF CONFUSION-USUALLY INVOLVES DAY/NIGHT REVERSAL AND EPISODES OF TWITCHING AND FALLS--SEES DR. Jacelyn Grip - NEUROLOGIST-LAST SEEN 07/10/11  . LBBB (left bundle branch block)   . Atrial fibrillation ([place])   . COPD (chronic obstructive pulmonary disease) (HCC)     continuous O2- 2l  . Hepatitis     hx of in high school   . Silent aspiration     She has past surgical history that includes Appendectomy; pacemaker placement (12/21/2009); Peg removed; Tonsillectomy (age 31); Interstim implant revision (03/06/2011); Portacath placement (12/16/2009); Total abdominal hysterectomy; Interstim Implant placement (05/28/2006 - stage I); Interstim implant revision (10/23/2007); Cystoscopy with injection (04/30/2006); Small intestine surgery (05/20/2001); Cardiac catheterization (04/08/2006, 11/16/2009); Cystoscopy (11/11/2008); Cystoscopy w/ retrogrades (11/11/2008); Colectomy; Colectomy (02/04/2000); Gastrocutaneous fistula closure; Exploratory laparotomy (04/27/2009); Port-a-cath removal (07/27/2011); Total hip arthroplasty (08/03/2011); Interstim Implant placement (02/05/2012); Esophagogastroduodenoscopy (N/A, 02/02/2013); and Savory dilation (N/A, 02/02/2013).   Her family history includes Breast cancer in her maternal aunt; Diabetes in her paternal grandfather; Heart disease in her brother, father, mother, and paternal aunt; Stroke in her mother. There is no history  of Colon cancer.She reports that she quit smoking about 28 years ago. She has never used smokeless tobacco. She reports that she does not drink alcohol or use illicit drugs.  Current Outpatient Prescriptions on File Prior to Visit  Medication Sig Dispense Refill  . albuterol (PROAIR HFA) 108 (90 BASE) MCG/ACT inhaler Inhale 2 puffs into the lungs every 6 (six) hours as needed for wheezing or shortness of breath. 1 Inhaler  3  . AMBULATORY NON FORMULARY MEDICATION Pt takes Oxygen, 2 units, every day    . calcium carbonate (OS-CAL - DOSED IN MG OF ELEMENTAL CALCIUM) 1250 (500 CA) MG tablet Take 2 tablets (1,000 mg of elemental calcium total) by mouth 3 (three) times daily with meals. 180 tablet 0  . carvedilol (COREG) 6.25 MG tablet Take 1 tablet (6.25 mg total) by mouth 2 (two) times daily with a meal. 60 tablet 3  . furosemide (LASIX) 40 MG tablet TAKE ONE TABLET BY MOUTH IN THE MORNING 90 tablet 1  . gabapentin (NEURONTIN) 600 MG tablet TAKE ONE TABLET BY MOUTH 4 TIMES DAILY 120 tablet 11  . loperamide (IMODIUM A-D) 2 MG tablet Take 1/2 tablet by mouth daily as needed for diarrhea. 30 tablet 2  . MYRBETRIQ 25 MG TB24 tablet Take 1 tablet by mouth daily.    . nitroGLYCERIN (NITROSTAT) 0.4 MG SL tablet Place 0.4 mg under the tongue every 5 (five) minutes as needed. CHEST PAIN    . pantoprazole (PROTONIX) 40 MG tablet Take 1 tablet (40 mg total) by mouth 2 (two) times daily. 60 tablet 2  . promethazine (PHENERGAN) 25 MG tablet TAKE ONE TABLET BY MOUTH EVERY 8 HOURS AS NEEDED FOR NAUSEA 40 tablet 1  . tiotropium (SPIRIVA HANDIHALER) 18 MCG inhalation capsule Place 1 capsule (18 mcg total) into inhaler and inhale every morning. 10 capsule 0  . [DISCONTINUED] iron dextran complex (INFED) 50 MG/ML injection Please give infed infusion (no test dose needed) over 4 hours per pharmacy calculated dose. Ht: 5'2 Wt: 99 pounds Hgb: 12 1 mL 0   No current facility-administered medications on file prior to visit.     Objective:  Objective Physical Exam  Constitutional: She is oriented to person, place, and time. She appears well-developed and well-nourished.  HENT:  Head: Normocephalic and atraumatic.  Eyes: Conjunctivae and EOM are normal.  Neck: Normal range of motion. Neck supple. No JVD present. Carotid bruit is not present. No thyromegaly present.  Cardiovascular: Normal rate, regular rhythm and normal heart sounds.     No murmur heard. Pulmonary/Chest: Effort normal and breath sounds normal. She has no wheezes. She has no rales. She exhibits no tenderness.  On O2  Musculoskeletal: She exhibits no edema.  Neurological: She is alert and oriented to person, place, and time.  Psychiatric: She has a normal mood and affect. Her behavior is normal. Judgment and thought content normal.  Nursing note and vitals reviewed.  BP 124/79 mmHg  Pulse 68  Temp(Src) 98.7 F (37.1 C) (Oral)  Ht '5\' 1"'$  (1.549 m)  Wt 104 lb 12.8 oz (47.537 kg)  BMI 19.81 kg/m2  SpO2 100% Wt Readings from Last 3 Encounters:  03/04/15 104 lb 12.8 oz (47.537 kg)  01/20/15 104 lb (47.174 kg)  12/28/14 99 lb 3.2 oz (44.997 kg)     Lab Results  Component Value Date   WBC 7.2 01/20/2015   HGB 12.4 01/20/2015   HCT 38.2 01/20/2015   PLT 296.0 01/20/2015   GLUCOSE 79 11/15/2014  CHOL 171 07/22/2013   TRIG 131.0 07/22/2013   HDL 37.10* 07/22/2013   LDLCALC 108* 07/22/2013   ALT 18 05/28/2014   AST 25 05/28/2014   NA 143 11/15/2014   K 4.9 11/15/2014   CL 99 11/15/2014   CREATININE 0.92 11/15/2014   BUN 17 11/15/2014   CO2 35* 11/15/2014   TSH 4.43 06/26/2010   INR 1.12 05/20/2014   HGBA1C  08/17/2007    5.6 (NOTE)   The ADA recommends the following therapeutic goals for glycemic   control related to Hgb A1C measurement:   Goal of Therapy:   < 7.0% Hgb A1C   Action Suggested:  > 8.0% Hgb A1C   Ref:  Diabetes Care, 22, Suppl. 1, 1999    No results found.   Assessment & Plan:  Plan I have changed Ms. Varas's tiZANidine and zolpidem. I am also having her maintain her nitroGLYCERIN, loperamide, MYRBETRIQ, albuterol, AMBULATORY NON FORMULARY MEDICATION, gabapentin, calcium carbonate, carvedilol, tiotropium, promethazine, pantoprazole, furosemide, folic acid, methotrexate, and oxyCODONE-acetaminophen.  Meds ordered this encounter  Medications  . folic acid (FOLVITE) 1 MG tablet    Sig: Take 1 mg by mouth daily.  .  methotrexate 2.5 MG tablet    Sig: Take 10 mg by mouth once a week.  [name] oxyCODONE-acetaminophen (PERCOCET) 10-325 MG tablet    Sig: Take 1 tablet by mouth every 4 (four) hours as needed.    Dispense:  120 tablet    Refill:  0  . tiZANidine (ZANAFLEX) 4 MG tablet    Sig: Take 1 tablet (4 mg total) by mouth 3 (three) times daily as needed. for muscle spams    Dispense:  90 tablet    Refill:  1  . zolpidem (AMBIEN) 5 MG tablet    Sig: Take 1 tablet (5 mg total) by mouth at bedtime as needed.    Dispense:  30 tablet    Refill:  2    Problem List Items Addressed This Visit    Insomnia   Relevant Medications   zolpidem (AMBIEN) 5 MG tablet    Other Visit Diagnoses    Pain    -  Primary    Relevant Medications    oxyCODONE-acetaminophen (PERCOCET) 10-325 MG tablet    tiZANidine (ZANAFLEX) 4 MG tablet       Follow-up: Return in about 6 months (around 09/02/2015), or if symptoms worsen or fail to improve, for annual exam, fasting.  Garnet Koyanagi, DO

## 2015-03-09 ENCOUNTER — Other Ambulatory Visit: Payer: Self-pay

## 2015-03-09 ENCOUNTER — Telehealth: Payer: Self-pay

## 2015-03-09 DIAGNOSIS — M810 Age-related osteoporosis without current pathological fracture: Secondary | ICD-10-CM

## 2015-03-09 DIAGNOSIS — E2839 Other primary ovarian failure: Secondary | ICD-10-CM

## 2015-03-09 NOTE — Telephone Encounter (Signed)
I called that and advised her Dexa is now overdue. She said she will call [facility] for a BMD after Christmas. The order is in.    [place]

## 2015-03-13 IMAGING — DX DG HAND COMPLETE 3+V*L*
3 series · 3 of 3 positions shown · non-contrast
Comparison: None.

CLINICAL DATA: Fall

EXAM:
LEFT HAND - COMPLETE 3+ VIEW

[hand pa]
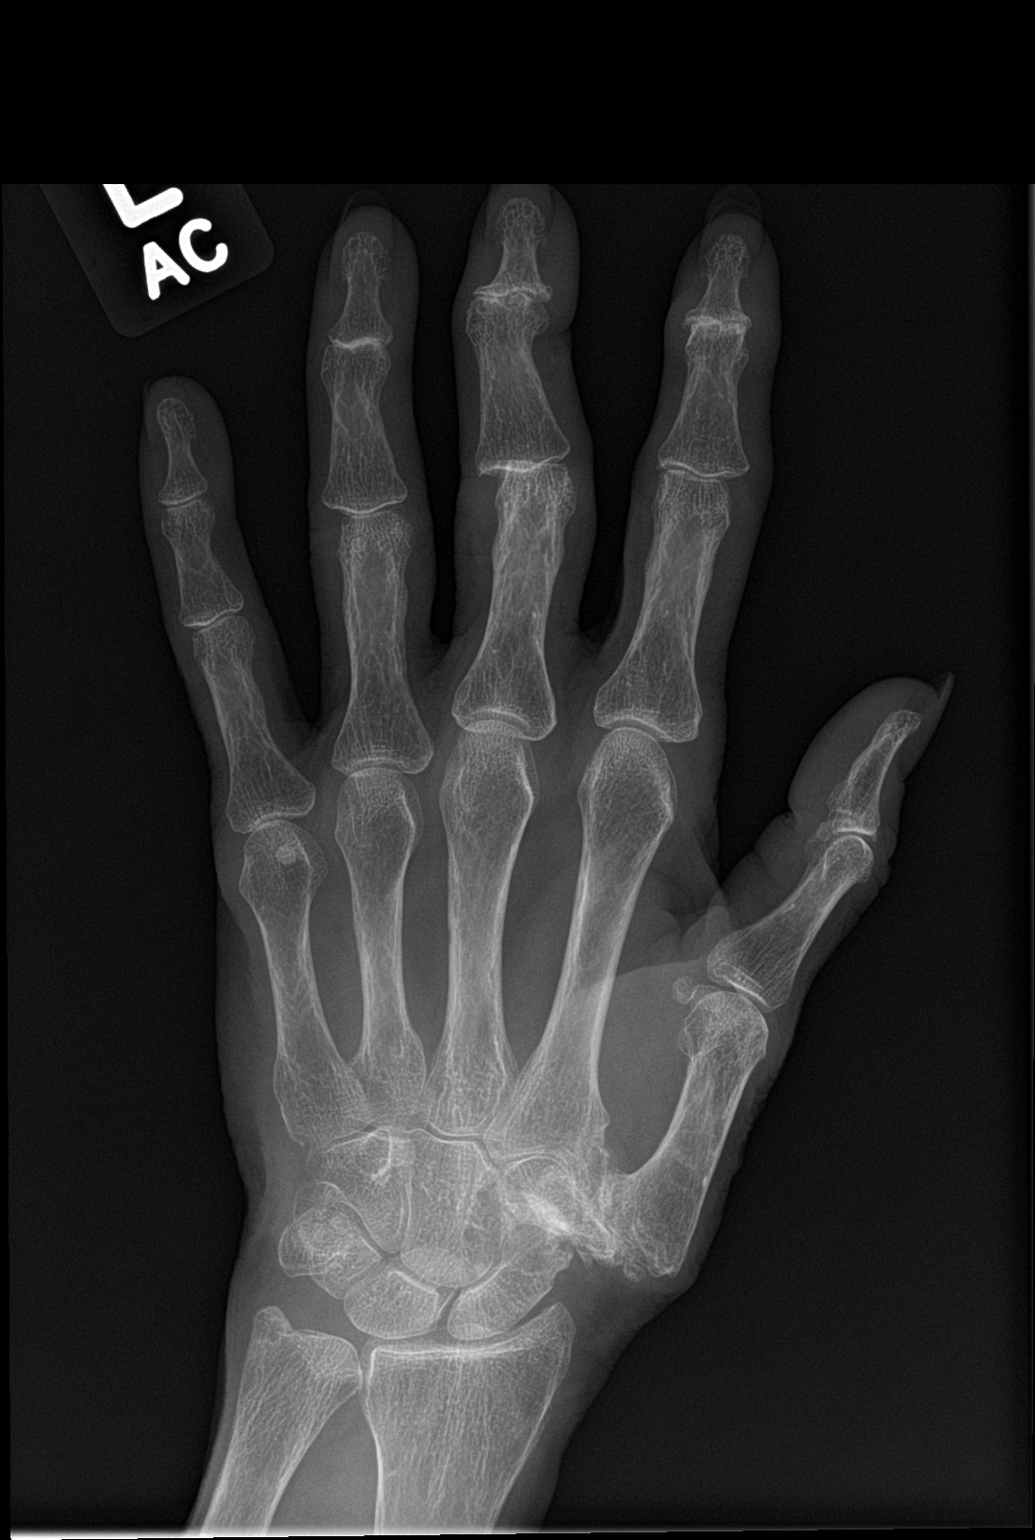

[hand obl]
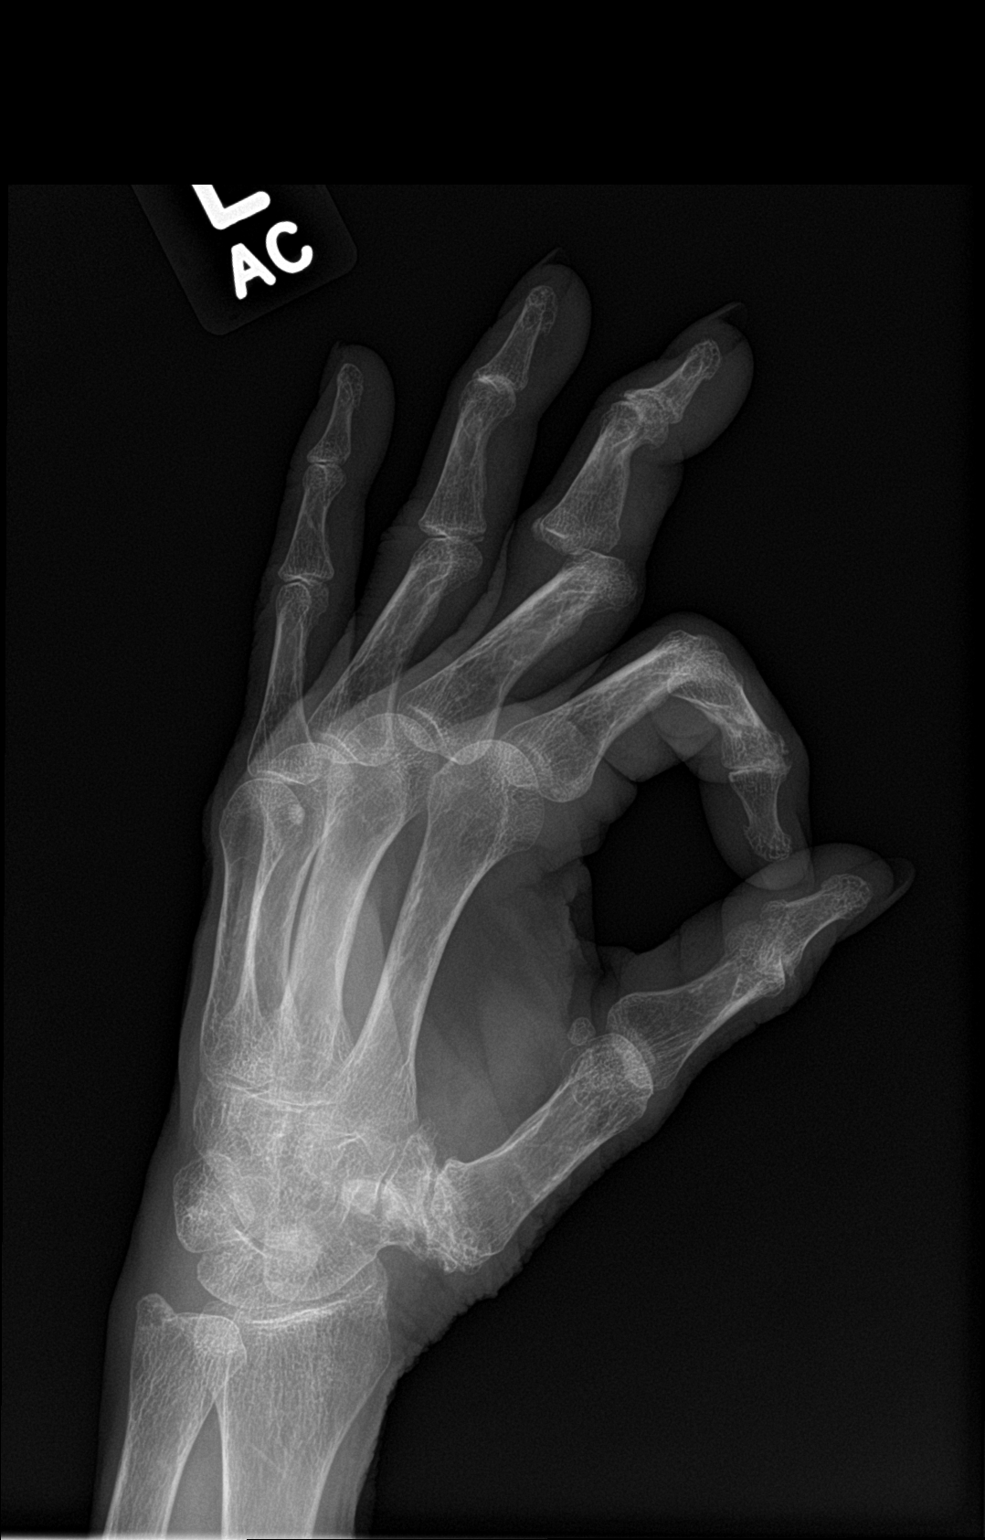

[hand lat]
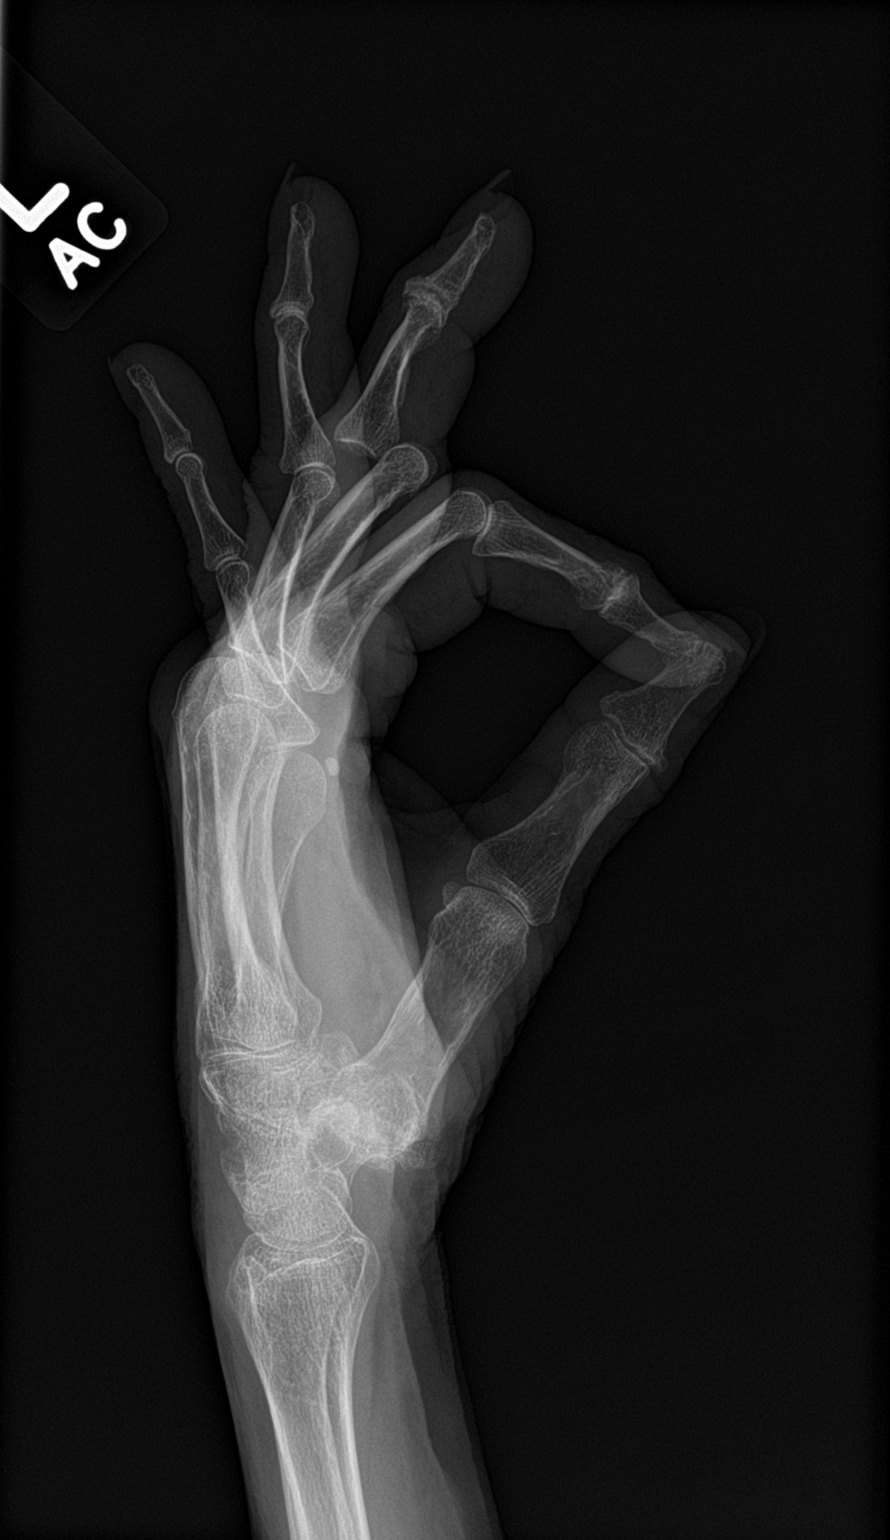

[3 of 3 positions shown; findings below may reference images not displayed]

FINDINGS: Posterior dislocation of the 3rd middle phalanx at the PIP joint.

Associated mild cortical irregularity on the oblique view suggests a
possible associated fracture of the base of the 3rd middle phalanx.

Additional possible fracture involving the base of the 4th middle
phalanx.

Associated soft tissue swelling of the 2nd and 3rd digit.

Degenerative changes of the 1st carpometacarpal joint.
IMPRESSION: Posterior dislocation of the 3rd middle phalanx at the PIP joint,
with suspected associated fracture at the base of the middle
phalanx.

Possible additional fracture involving the base of the 4th middle
phalanx.

## 2015-03-13 IMAGING — DX DG HAND 2V*L*
2 series · 2 of 2 positions shown · non-contrast
Comparison: 05/15/2014

CLINICAL DATA: Postreduction left middle finger.

EXAM:
LEFT HAND - 2 VIEW

[hand pa]
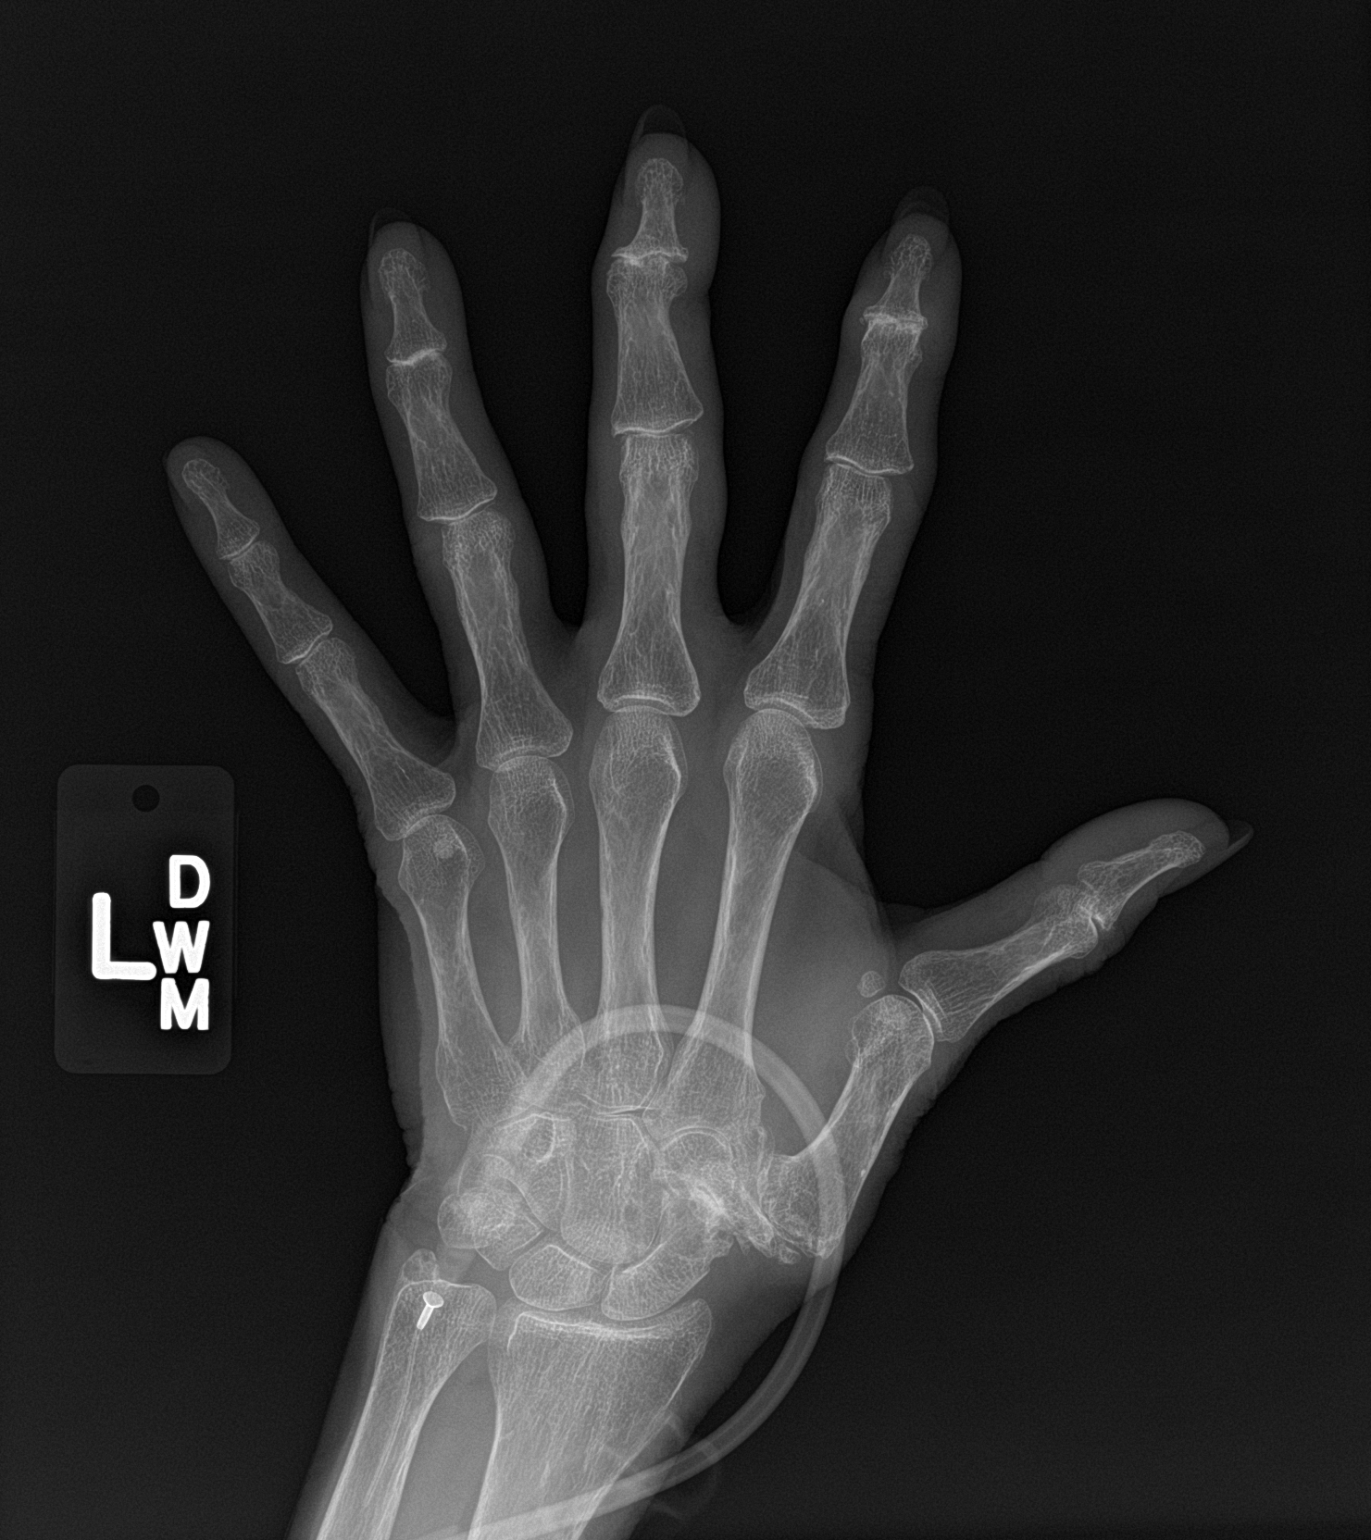

[hand lat]
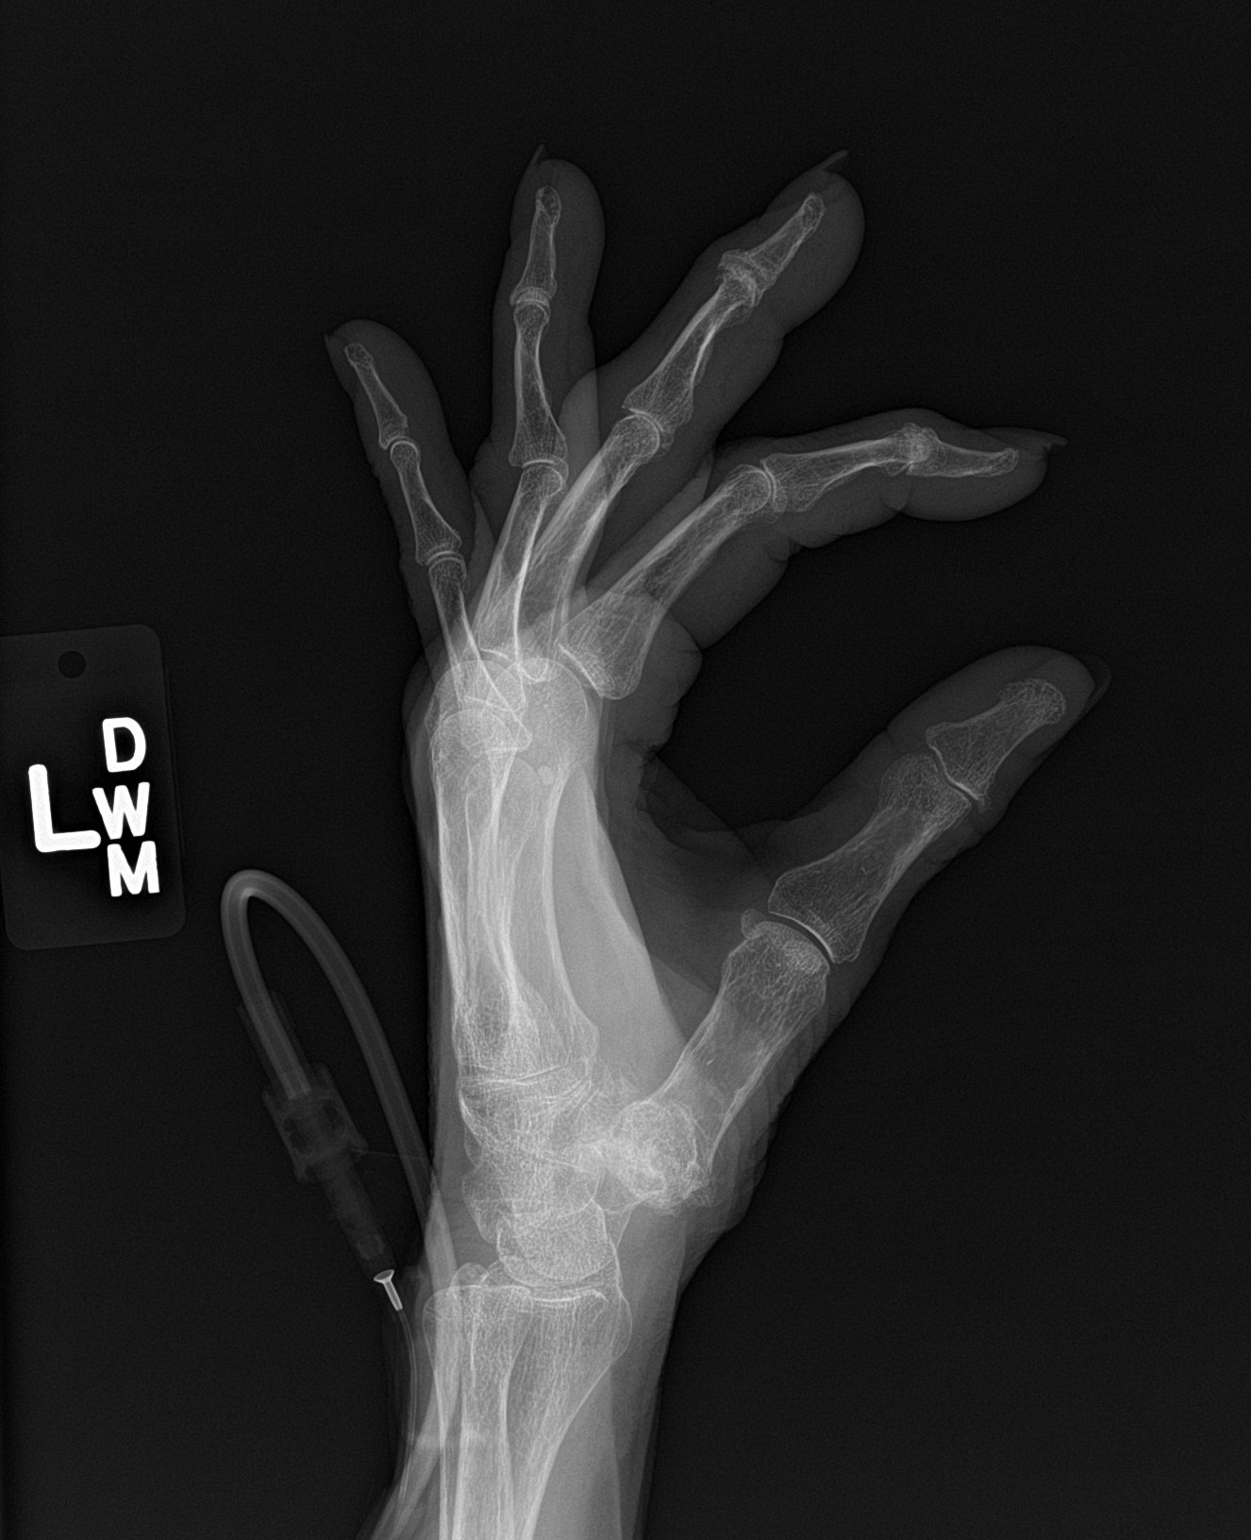

[2 of 2 positions shown; findings below may reference images not displayed]

FINDINGS: Interval relocation of the proximal interphalangeal joint of the
left third finger. There is associated soft tissue swelling. No
definite fracture is appreciated. Prominent degenerative changes
throughout the interphalangeal joints and in the wrist. Collapse and
compression of the trapezium, likely degenerative.
IMPRESSION: Interval relocation of the proximal interphalangeal joint of the
left third finger with good alignment demonstrated. Soft tissue
swelling is present. Diffuse degenerative changes.

## 2015-03-13 IMAGING — DX DG RIBS W/ CHEST 3+V*R*
3 series · 3 of 3 positions shown · non-contrast
Comparison: July 30, 2011.

CLINICAL DATA: Acute right rib pain after falling at home. Initial
encounter.

EXAM:
RIGHT RIBS AND CHEST - 3+ VIEW

[chest pa]
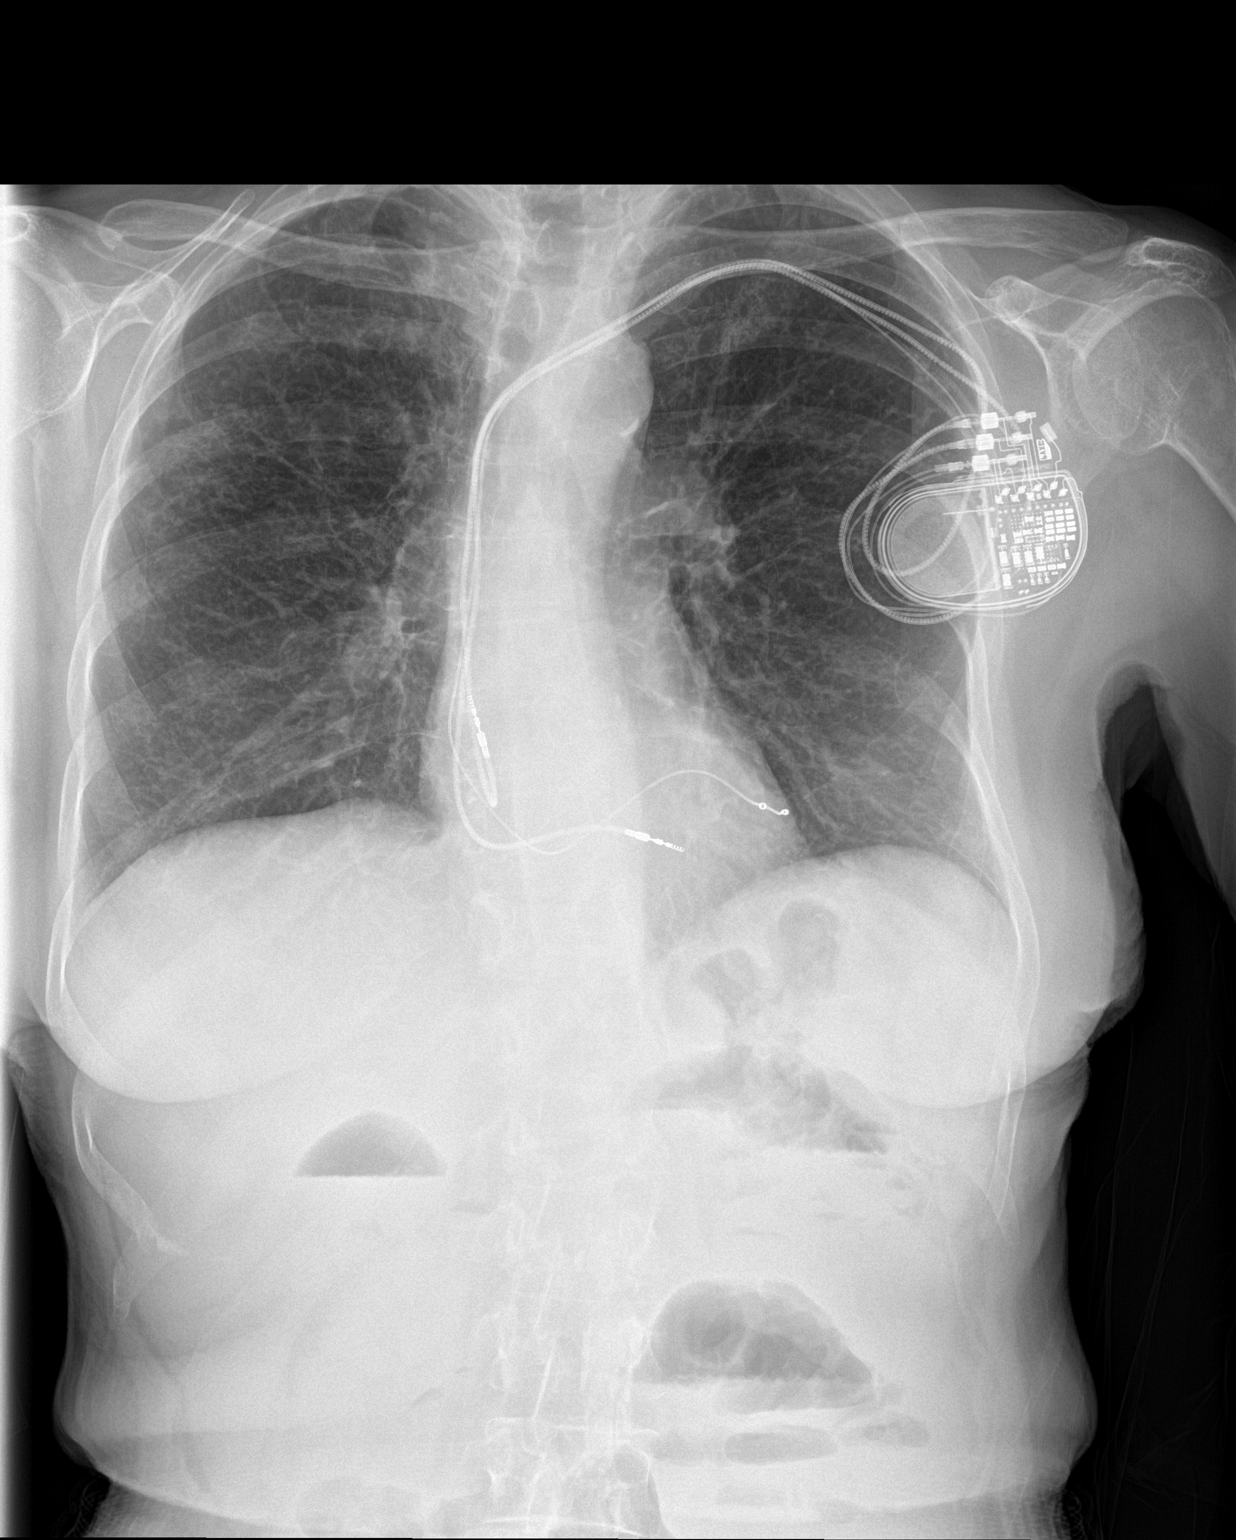

[rib ap]
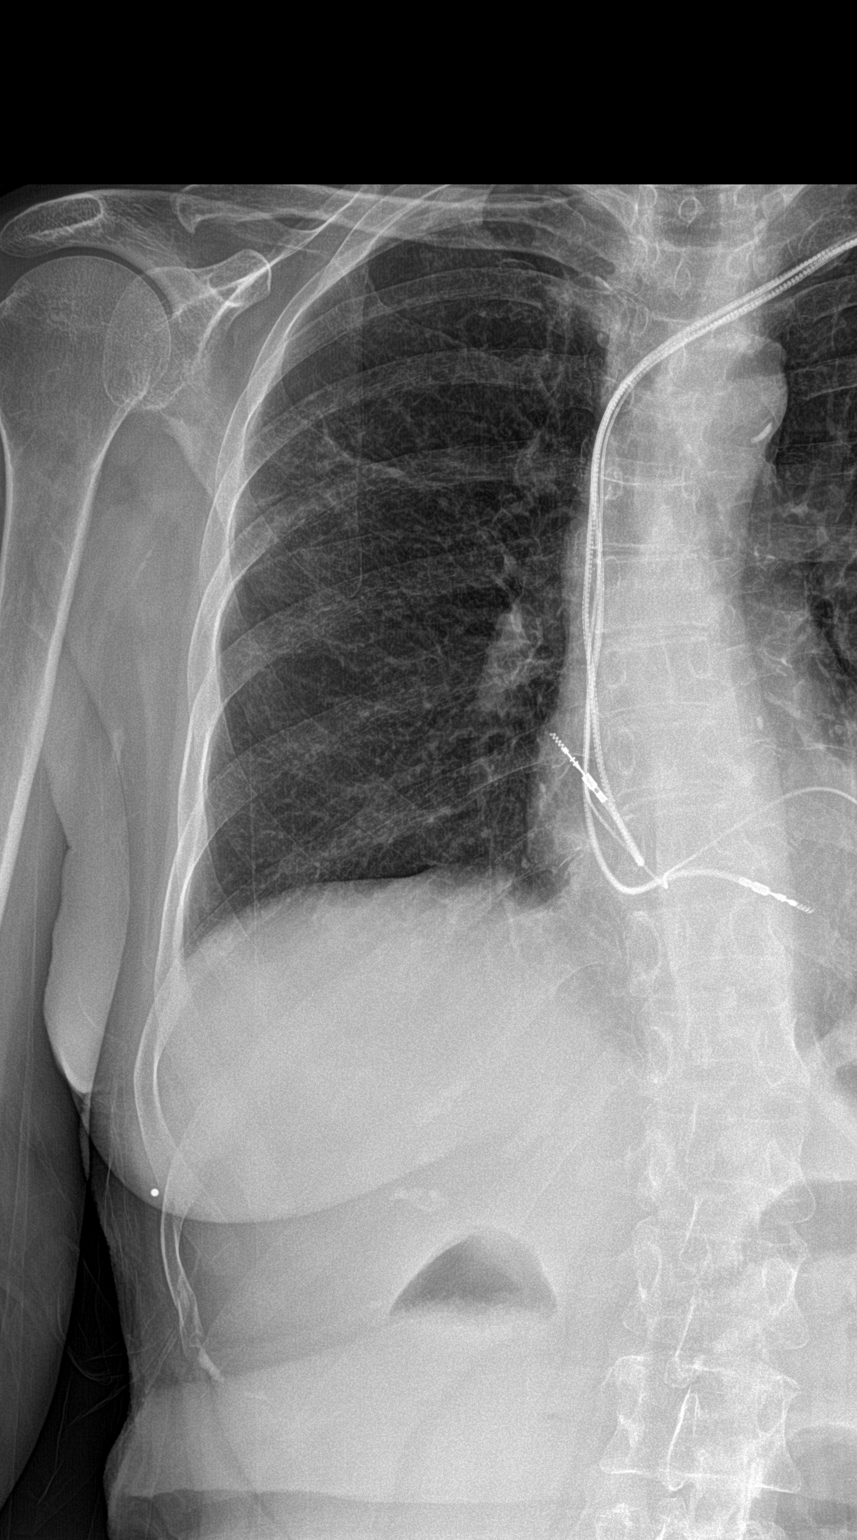

[rib ap obl]
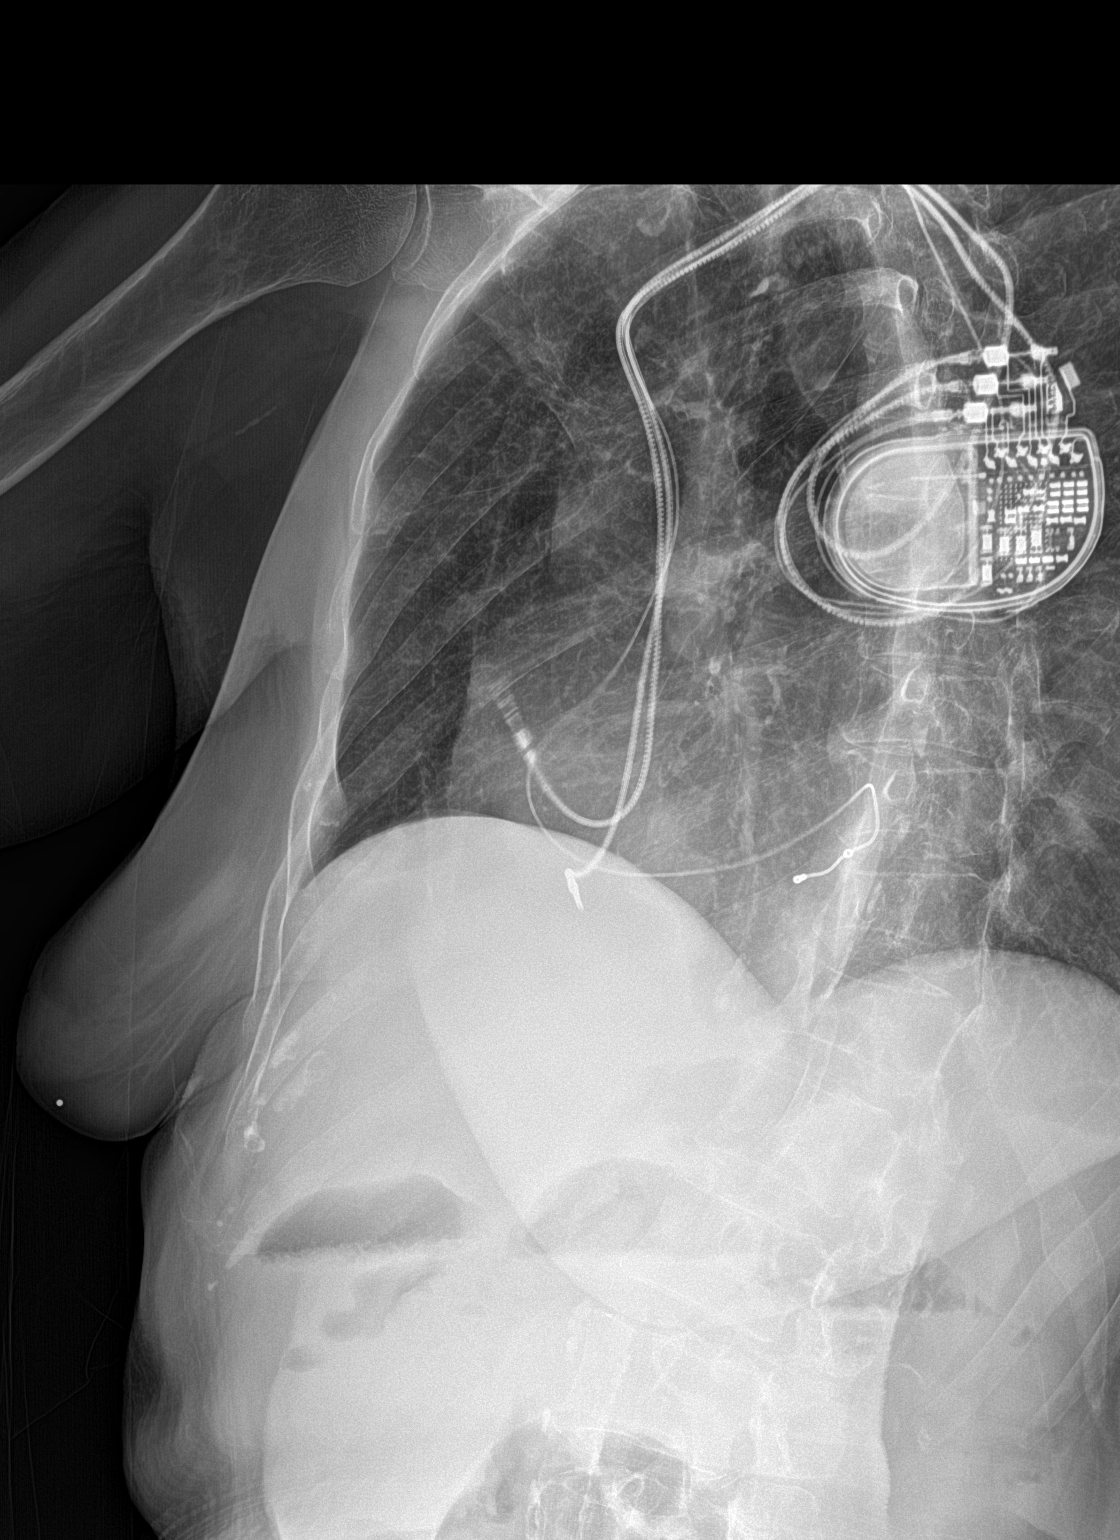

[3 of 3 positions shown; findings below may reference images not displayed]

FINDINGS: Minimally displaced fracture seen involving the anterior portion of
the right tenth rib. There is no evidence of pneumothorax or pleural
effusion. Both lungs are clear. Left-sided pacemaker is unchanged in
position. Heart size and mediastinal contours are within normal
limits.
IMPRESSION: Minimally displaced right tenth rib fracture. No acute
cardiopulmonary abnormality seen.

## 2015-03-15 IMAGING — CT CT ABD-PELV W/ CM
1 of 3 series · 14 of 32 positions shown, 19 images · IV contrast (OMNIPAQUE 300)
Comparison: CT scan of December 11, 2011.

CLINICAL DATA: Generalized abdominal pain.

EXAM:
CT ABDOMEN AND PELVIS WITH CONTRAST
TECHNIQUE: Multidetector CT imaging of the abdomen and pelvis was performed
using the standard protocol following bolus administration of
intravenous contrast.
CONTRAST:  80mL OMNIPAQUE IOHEXOL 300 MG/ML  SOLN

[Series 2: abd/pel with · axial · 0.65mm/px · z∈[+1014,+1384]mm · 14 of 84 slices shown, 19 images]
[im 5/84  soft-tissue]
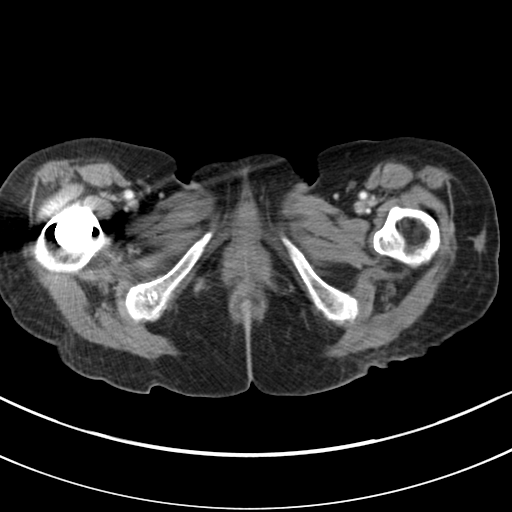
[im 5/84  bone]
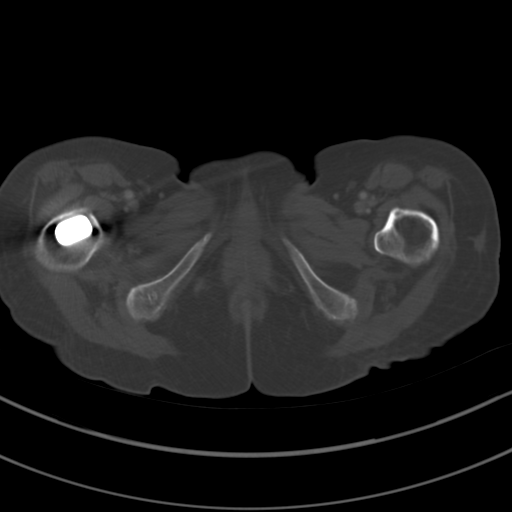
[im 14/84  soft-tissue]
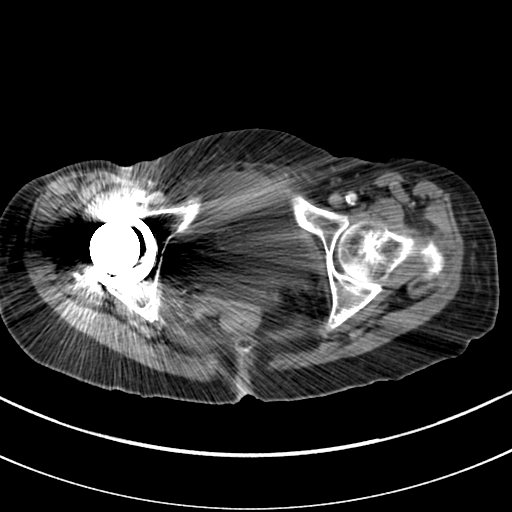
[im 18/84  soft-tissue]
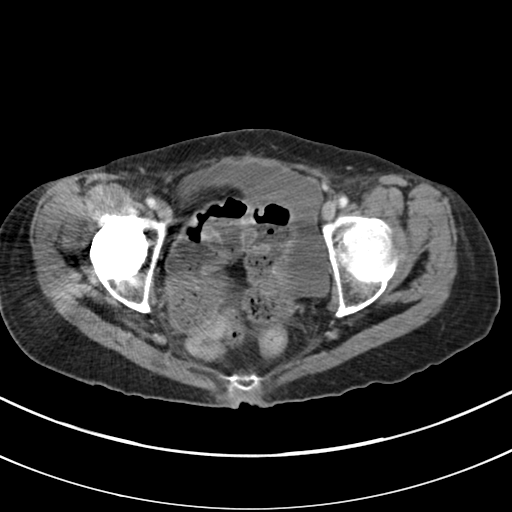
[im 22/84  soft-tissue]
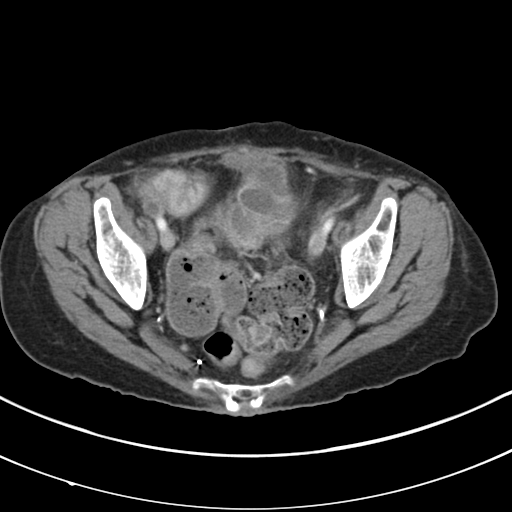
[im 31/84  soft-tissue]
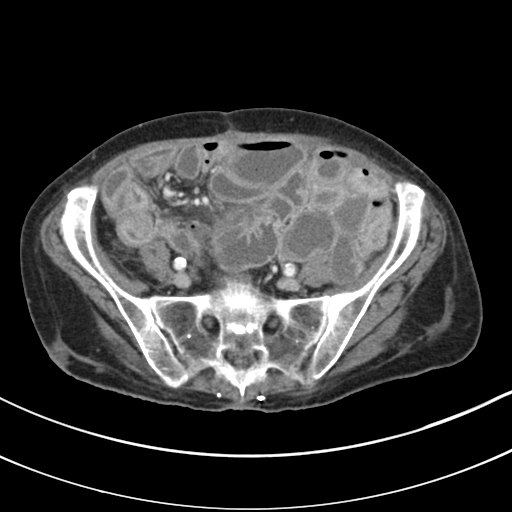
[im 35/84  soft-tissue]
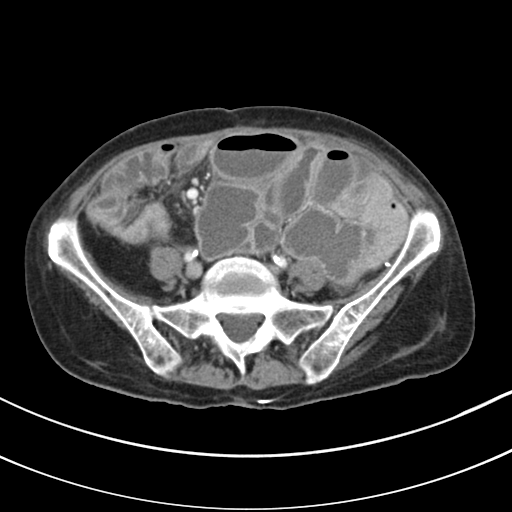
[im 44/84  soft-tissue]
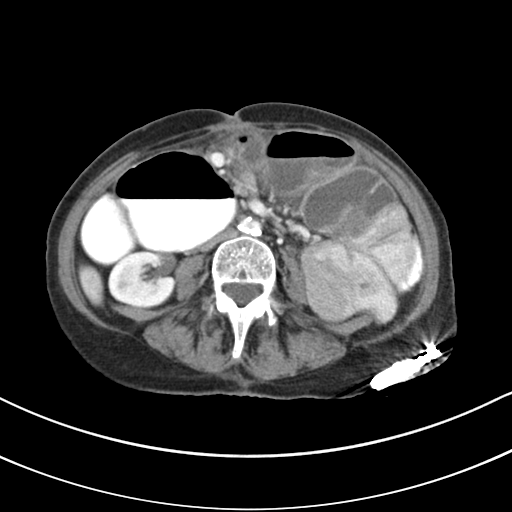
[im 49/84  soft-tissue]
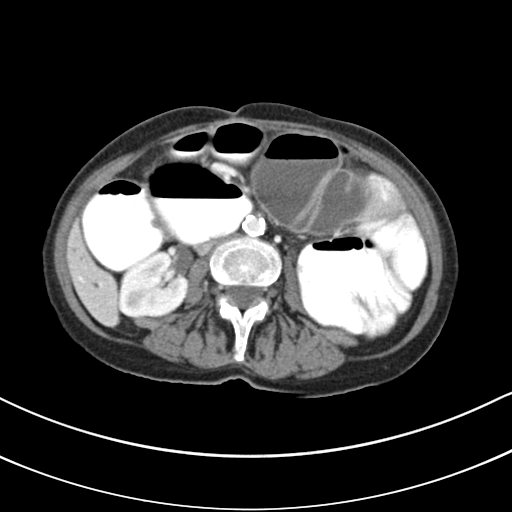
[im 53/84  soft-tissue]
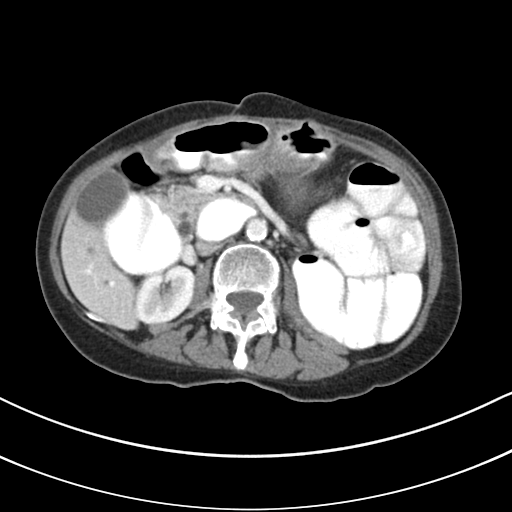
[im 53/84  bone]
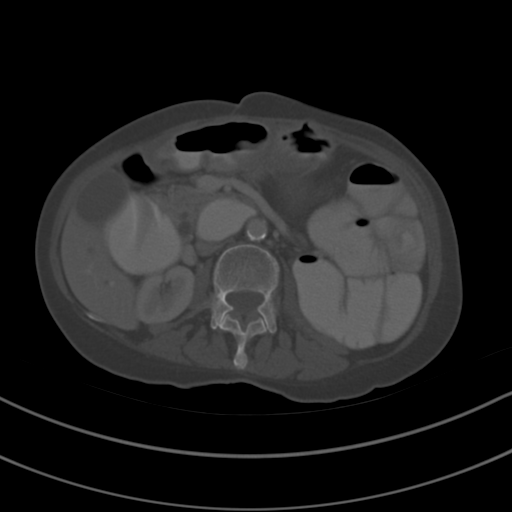
[im 62/84  soft-tissue]
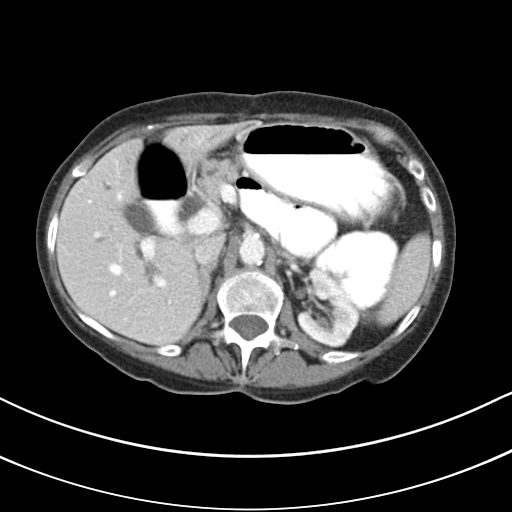
[im 66/84  soft-tissue]
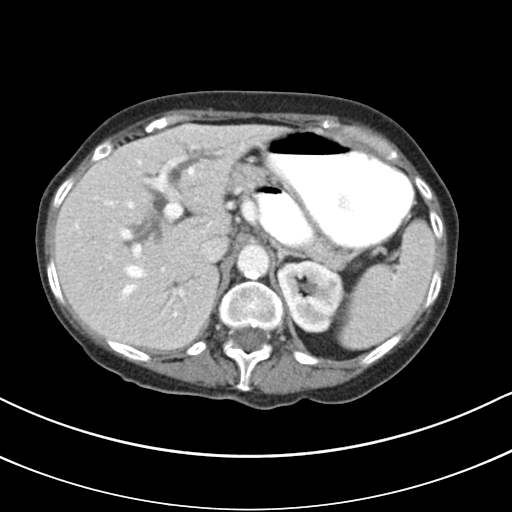
[im 66/84  lung]
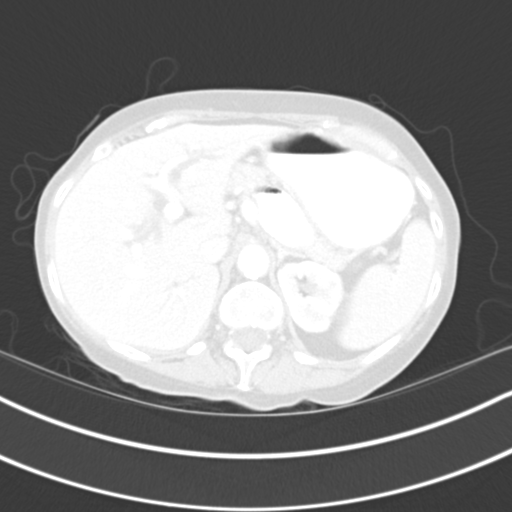
[im 70/84  soft-tissue]
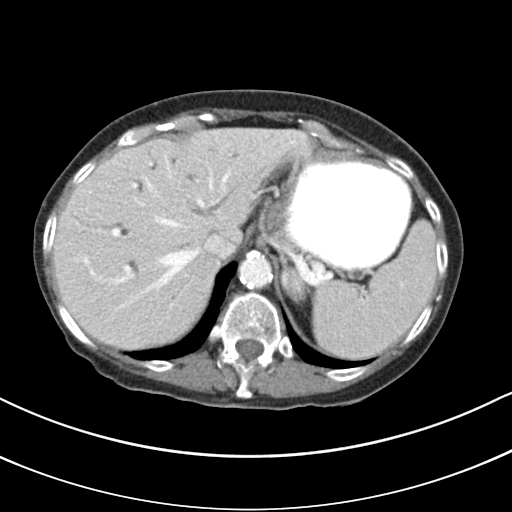
[im 70/84  lung]
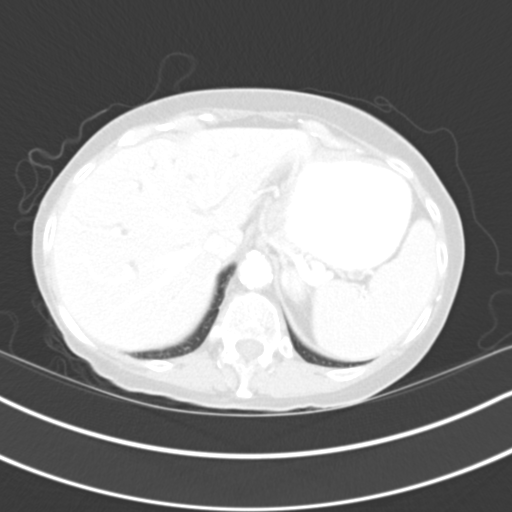
[im 75/84  lung]
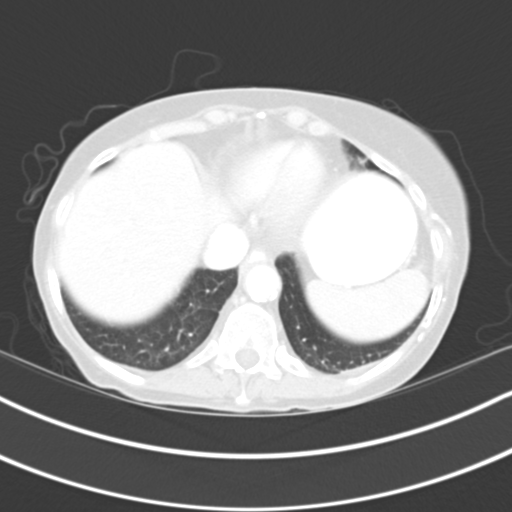
[im 79/84  soft-tissue]
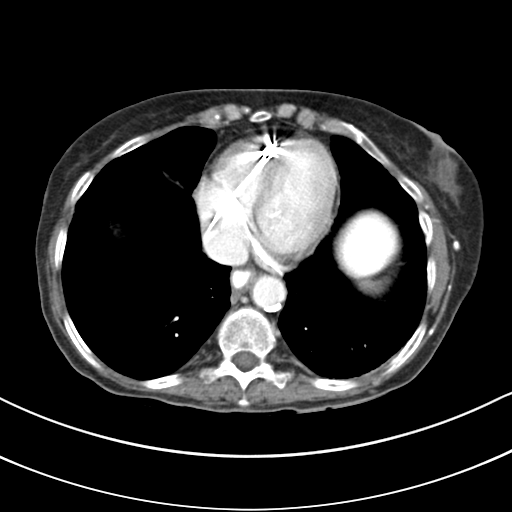
[im 79/84  lung]
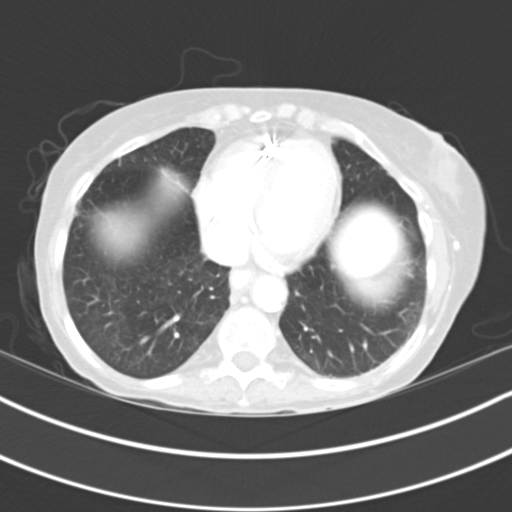

[14 of 32 positions shown; findings below may reference images not displayed]

FINDINGS: Moderate degenerative disc disease is noted at L4-5 with grade 1
anterolisthesis secondary to posterior facet joint hypertrophy.
Status post right total hip arthroplasty. Visualized lung bases
appear normal.

No gallstones are noted. Stable intrahepatic and extrahepatic
biliary dilatation is noted without definite cause of obstruction.
The spleen and pancreas appear normal. Adrenal glands and kidneys
appear normal without hydronephrosis or renal obstruction. There
remains severe proximal small bowel dilatation concerning for distal
small bowel obstruction, although definite transition zone is not
identified. Stool is noted in the sigmoid colon. Atherosclerotic
calcifications of abdominal aorta are noted without aneurysm
formation. No abnormal fluid collection is noted. Urinary bladder
appears normal. Status post hysterectomy.
IMPRESSION: Stable intrahepatic and extrahepatic biliary dilatation is noted
without definite cause of obstruction.

Severe proximal small bowel dilatation is noted without definite
transition zone identified. This is concerning for partial distal
small bowel obstruction.

## 2015-03-15 IMAGING — CR DG ABDOMEN ACUTE W/ 1V CHEST
3 series · 3 of 3 positions shown · non-contrast
Comparison: CT 05/17/2014.  Chest x-ray 05/15/2014, 07/30/2011.

CLINICAL DATA: Abdominal pain.

EXAM:
ACUTE ABDOMEN SERIES (ABDOMEN 2 VIEW & CHEST 1 VIEW)

[w chest pa]
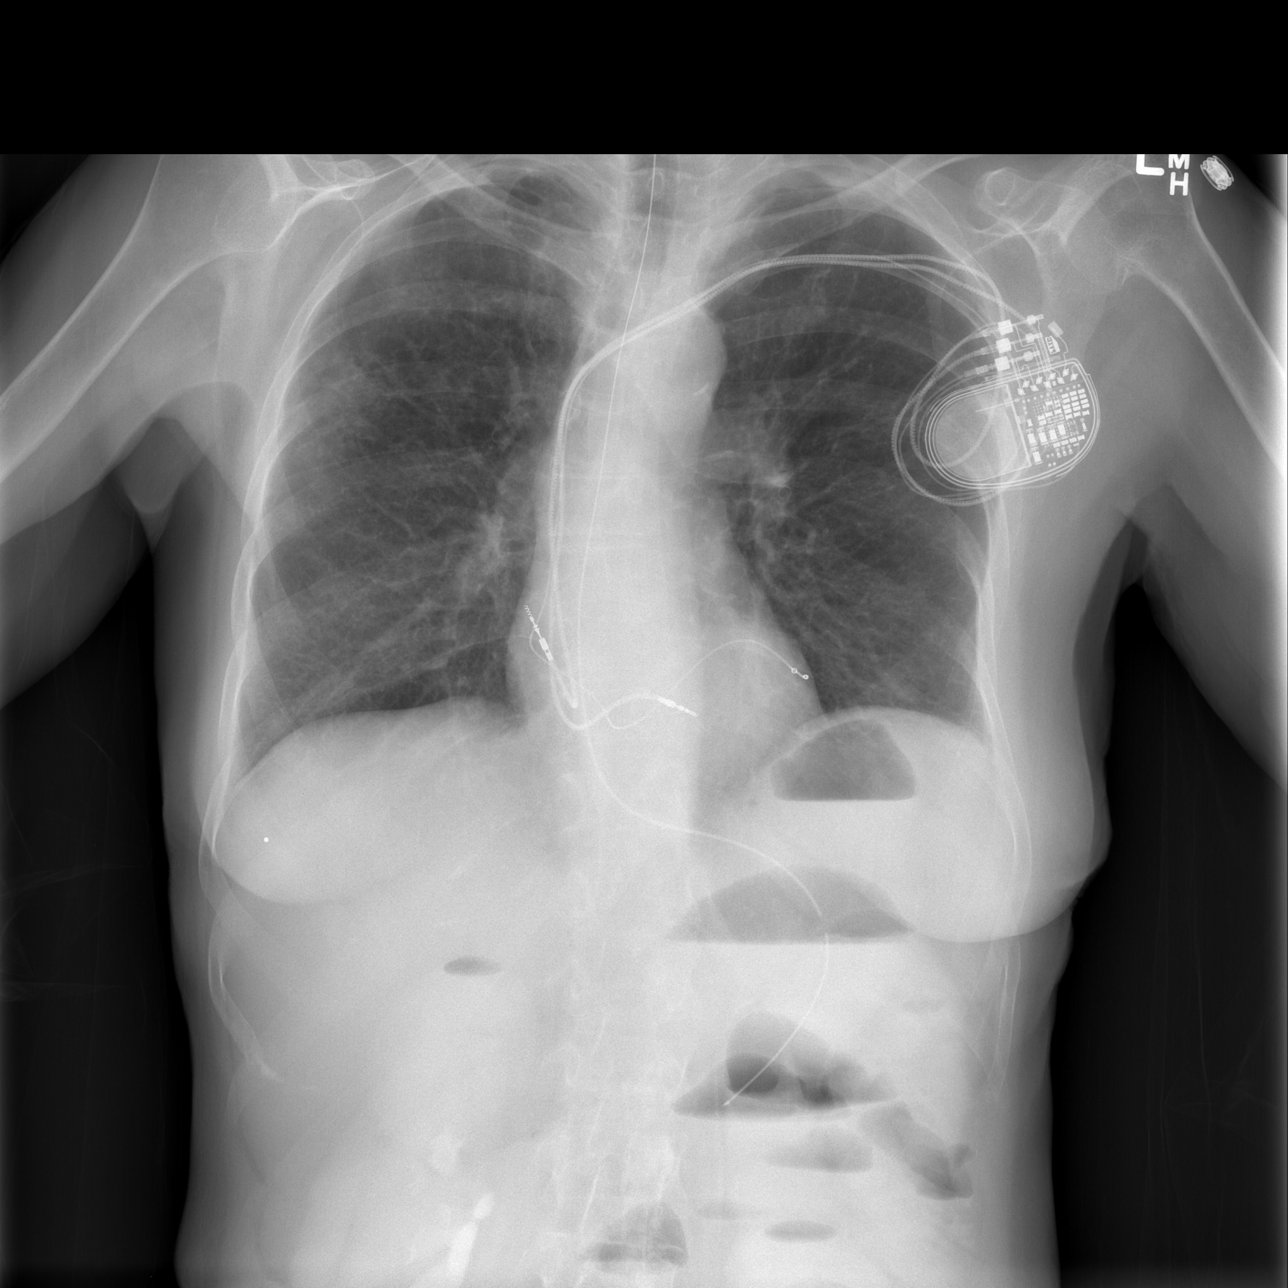

[w abdomen upright *]
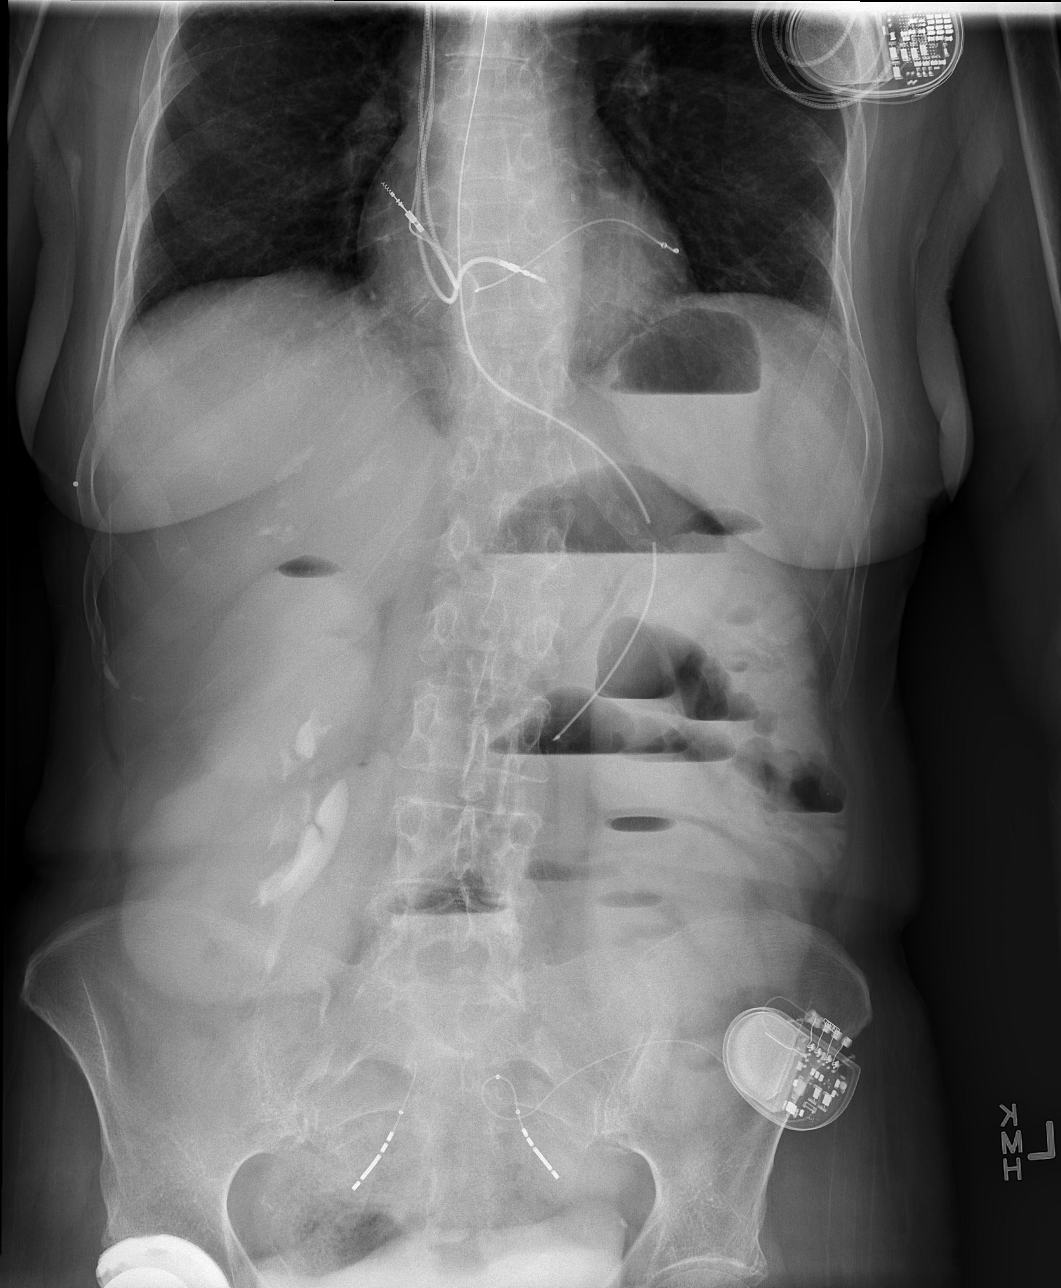

[t abdomen supine]
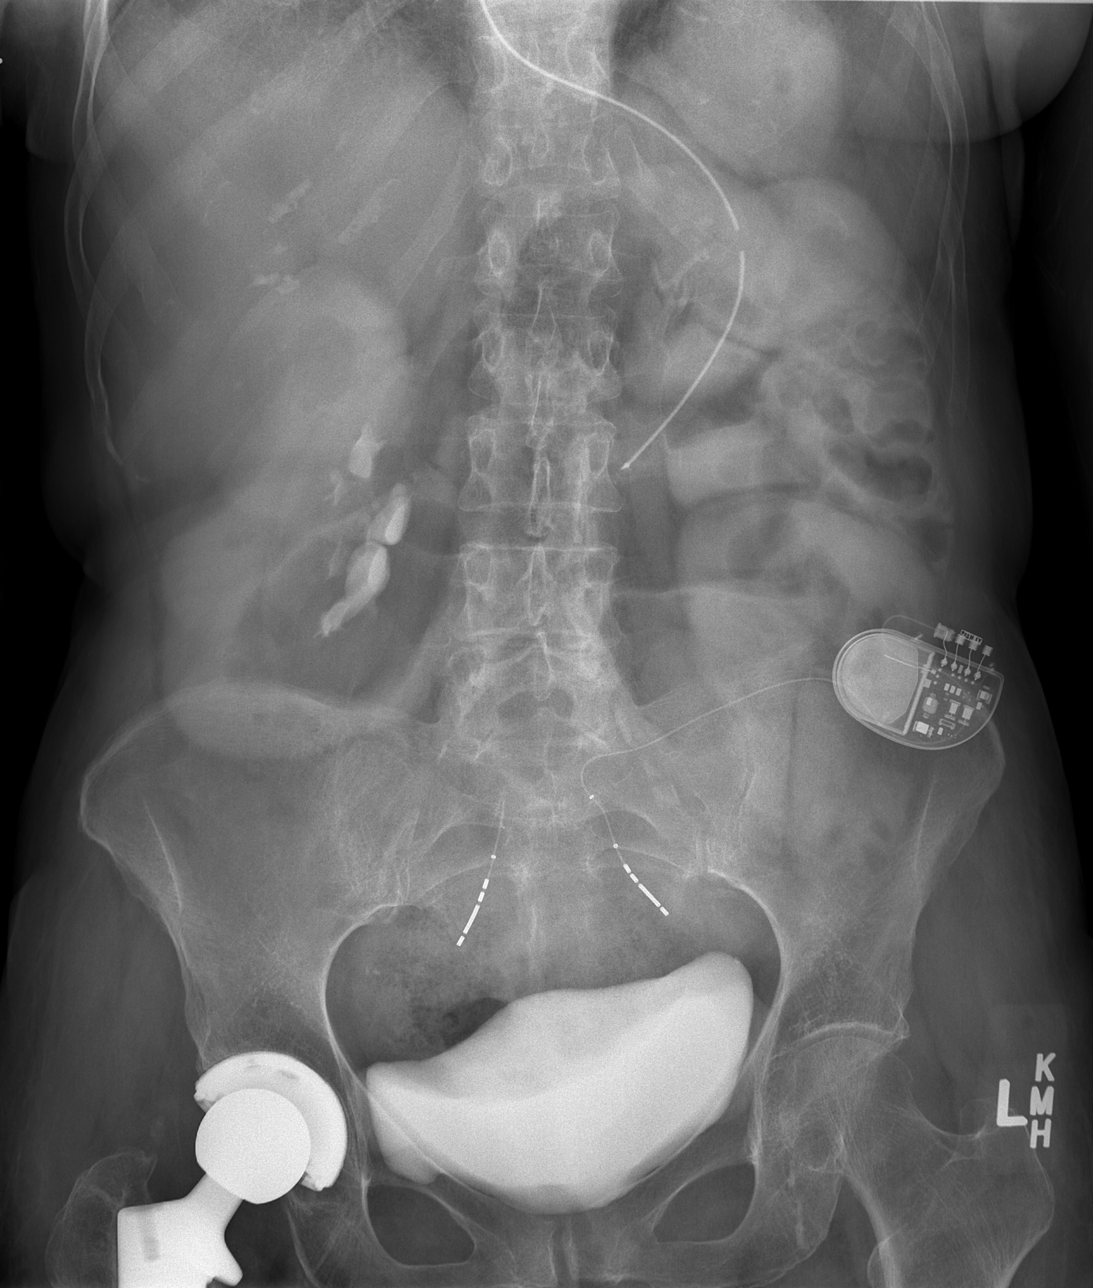

[3 of 3 positions shown; findings below may reference images not displayed]

FINDINGS: NG tube noted with tip in the stomach. Cardiac pacer with lead tips
in right atrium and right ventricle. Chronic interstitial changes
noted right upper lung.

Dilated proximal small bowel loops are noted. These findings are
again consistent with small-bowel obstruction. Contrast is noted
kidney from prior CT. No free air. No pneumatosis. Right hip
replacement. Neurostimulator.
IMPRESSION: Persistent changes of proximal small bowel obstruction. NG tube is
noted with its tip projected over the stomach.

## 2015-03-16 ENCOUNTER — Ambulatory Visit (INDEPENDENT_AMBULATORY_CARE_PROVIDER_SITE_OTHER)
Admission: RE | Admit: 2015-03-16 | Discharge: 2015-03-16 | Disposition: A | Discharge status: Home / Self Care | Payer: PPO | Source: Ambulatory Visit | Attending: Family Medicine | Admitting: Family Medicine

## 2015-03-16 DIAGNOSIS — E2839 Other primary ovarian failure: Secondary | ICD-10-CM

## 2015-03-16 DIAGNOSIS — M810 Age-related osteoporosis without current pathological fracture: Secondary | ICD-10-CM

## 2015-03-16 IMAGING — CR DG ABDOMEN ACUTE W/ 1V CHEST
3 series · 3 of 3 positions shown · non-contrast
Comparison: 05/17/2014 and chest x-ray 07/30/2011

CLINICAL DATA: History of small bowel obstruction.

EXAM:
ACUTE ABDOMEN SERIES (ABDOMEN 2 VIEW & CHEST 1 VIEW)

[w chest pa]
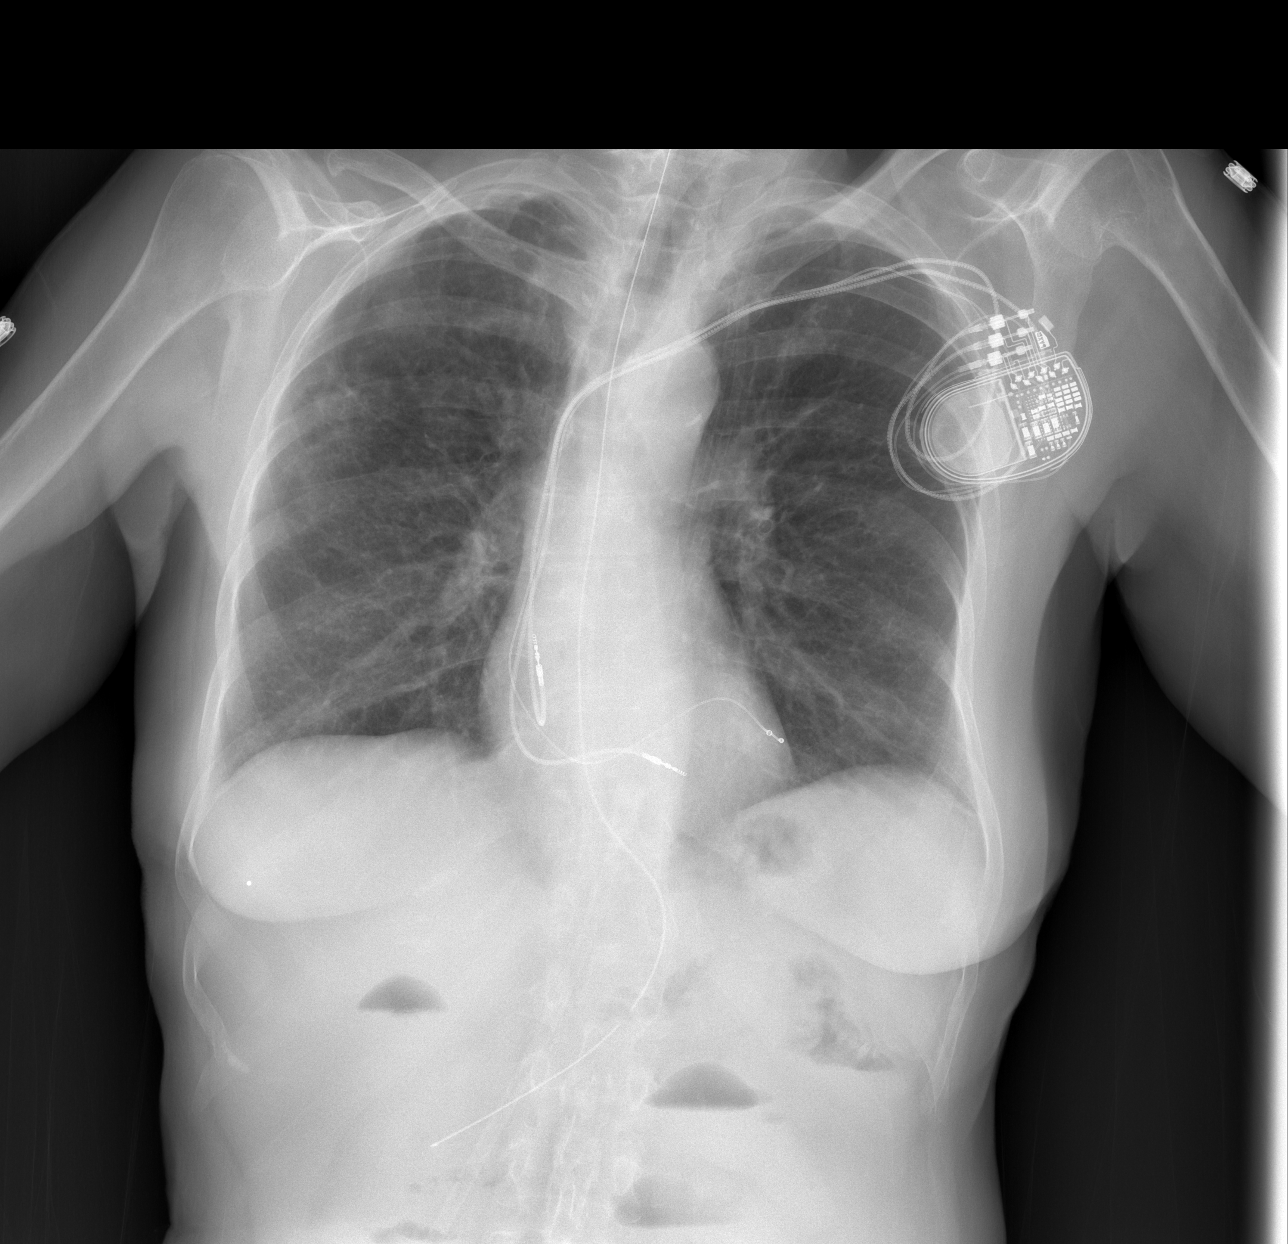

[w abdomen upright *]
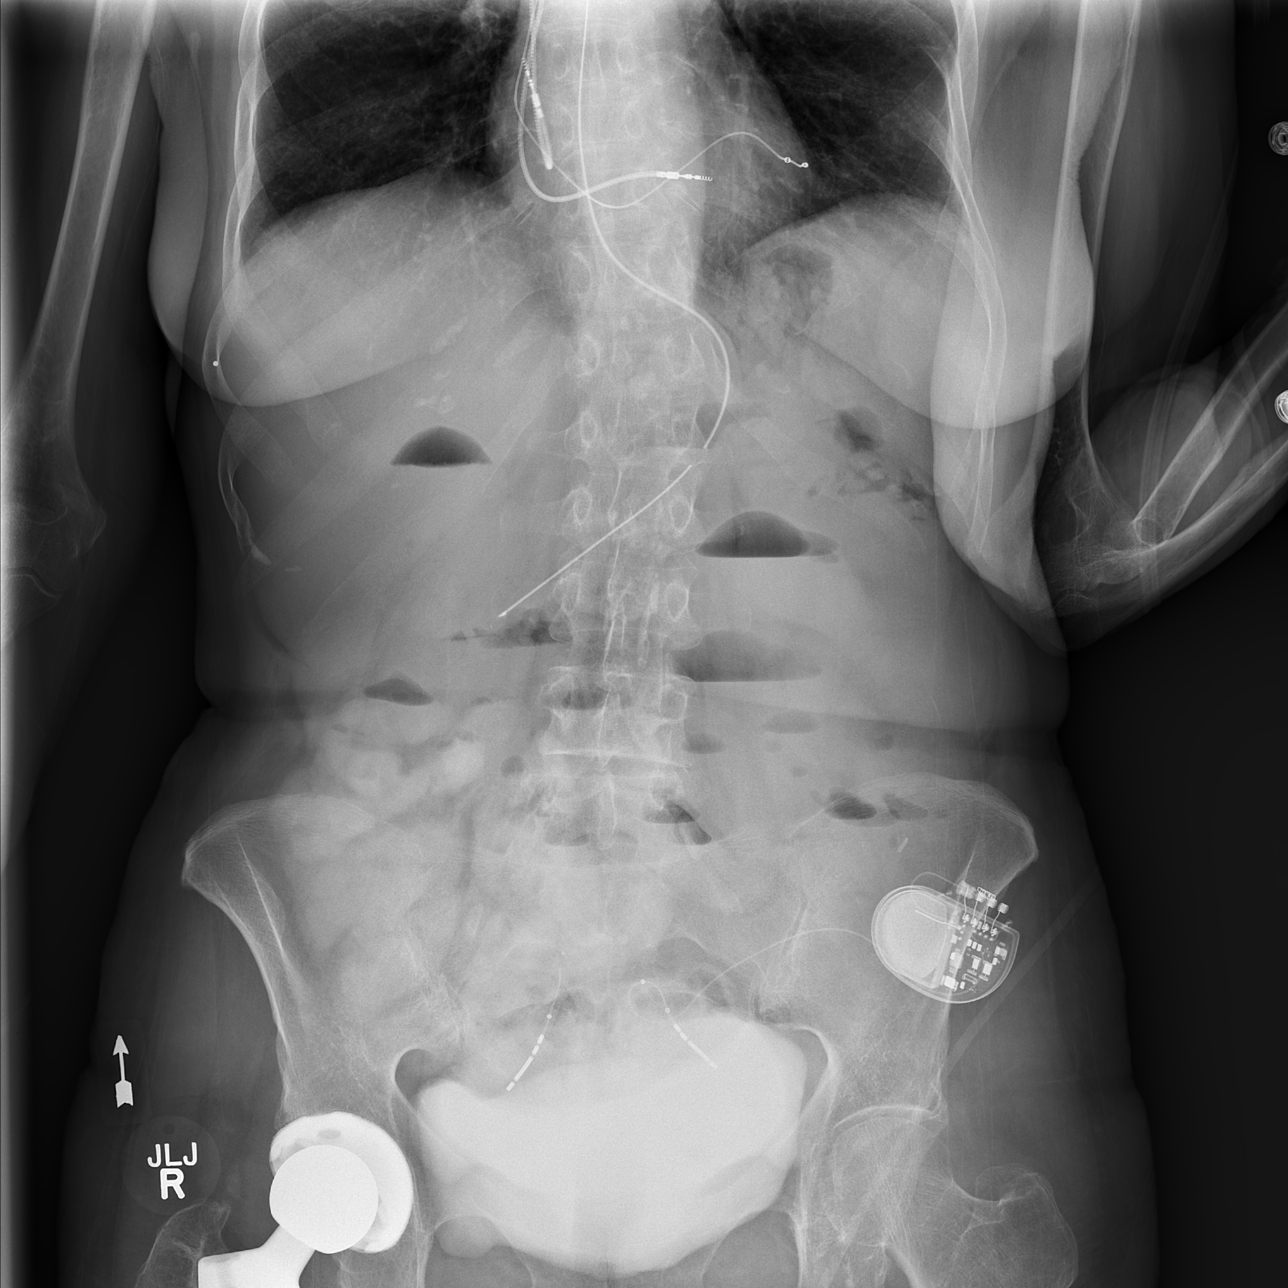

[t abdomen supine]
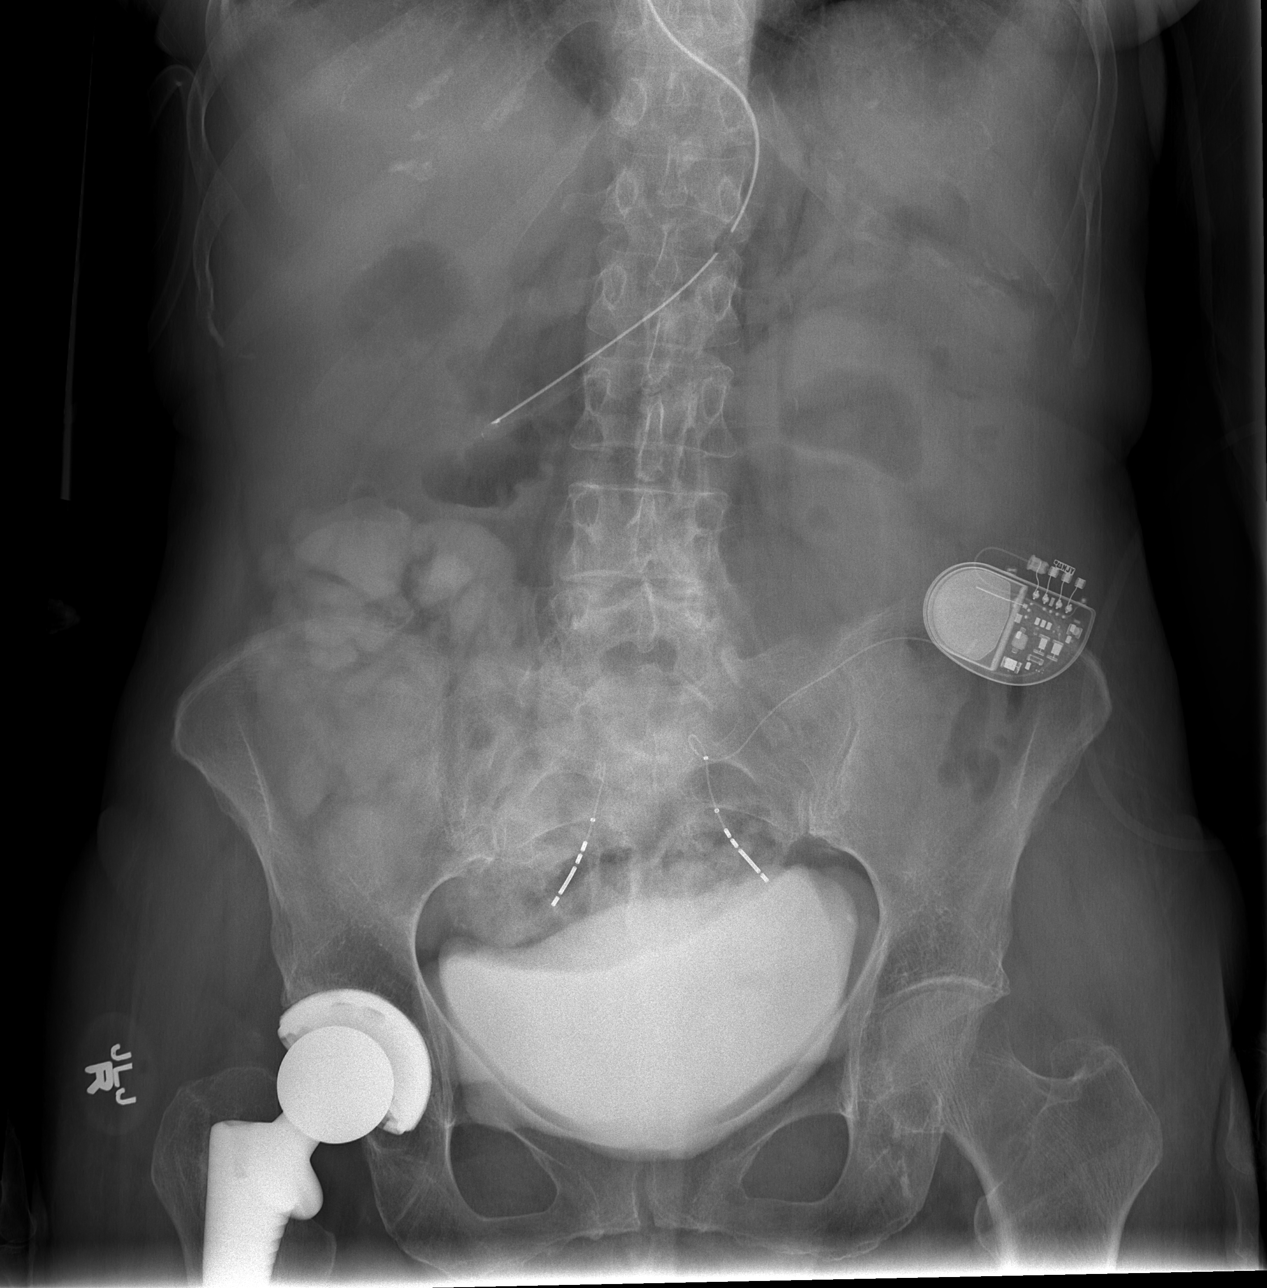

[3 of 3 positions shown; findings below may reference images not displayed]

FINDINGS: Left-sided pacemaker unchanged. Lungs are somewhat hypoinflated with
stable subtle focal interstitial density over the right upper lobe.
Cardiomediastinal silhouette and remainder of the chest is
unchanged.

Abdominal images demonstrate a nasogastric tube with tip over the
mid abdomen right of midline likely over the distal stomach. There
is a metallic device with associated small caliber catheter over the
left pelvis unchanged. Segment of catheter over the right upper
pelvis unchanged.

There are several scattered air-fluid levels without significant
change. Air and contrast are present within the colon. There is a
mildly dilated air-filled small bowel loop over the right mid
abdomen. No evidence of free peritoneal air. Remainder of the exam
is unchanged.
IMPRESSION: Single air-filled mildly dilated small bowel loop over the right mid
abdomen with Stable scattered air-fluid levels. No free peritoneal
air. Findings may be due to early/partial small bowel obstruction.

No acute cardiopulmonary disease.

Enteric tube with tip over the mid abdomen right of midline likely
within the distal stomach.

## 2015-03-17 ENCOUNTER — Other Ambulatory Visit: Payer: Self-pay | Admitting: Internal Medicine

## 2015-03-18 IMAGING — CR DG ABDOMEN 2V
2 series · 2 of 2 positions shown · non-contrast
Comparison: 05/18/2014

CLINICAL DATA: Mid abdominal discomfort, nausea

EXAM:
ABDOMEN - 2 VIEW

[w abdomen upright *]
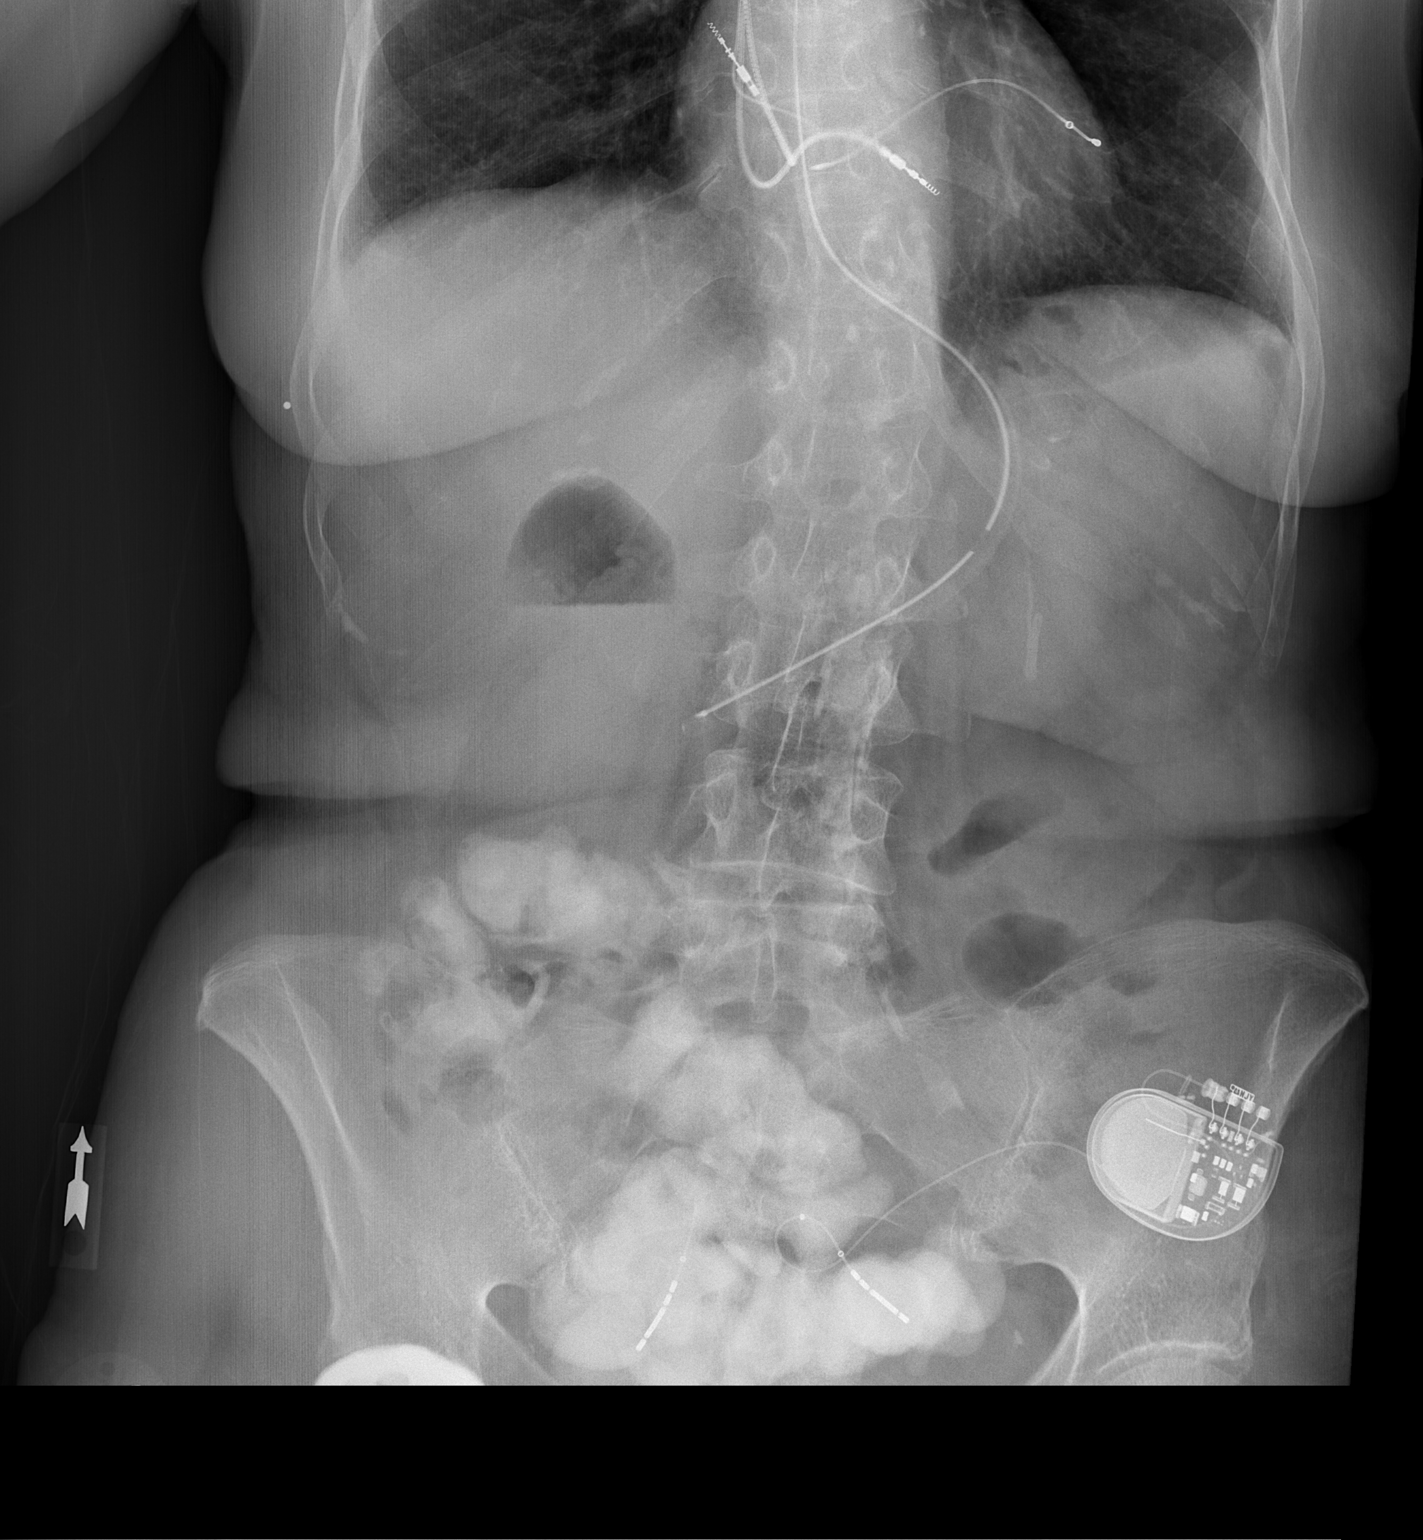

[t abdomen supine]
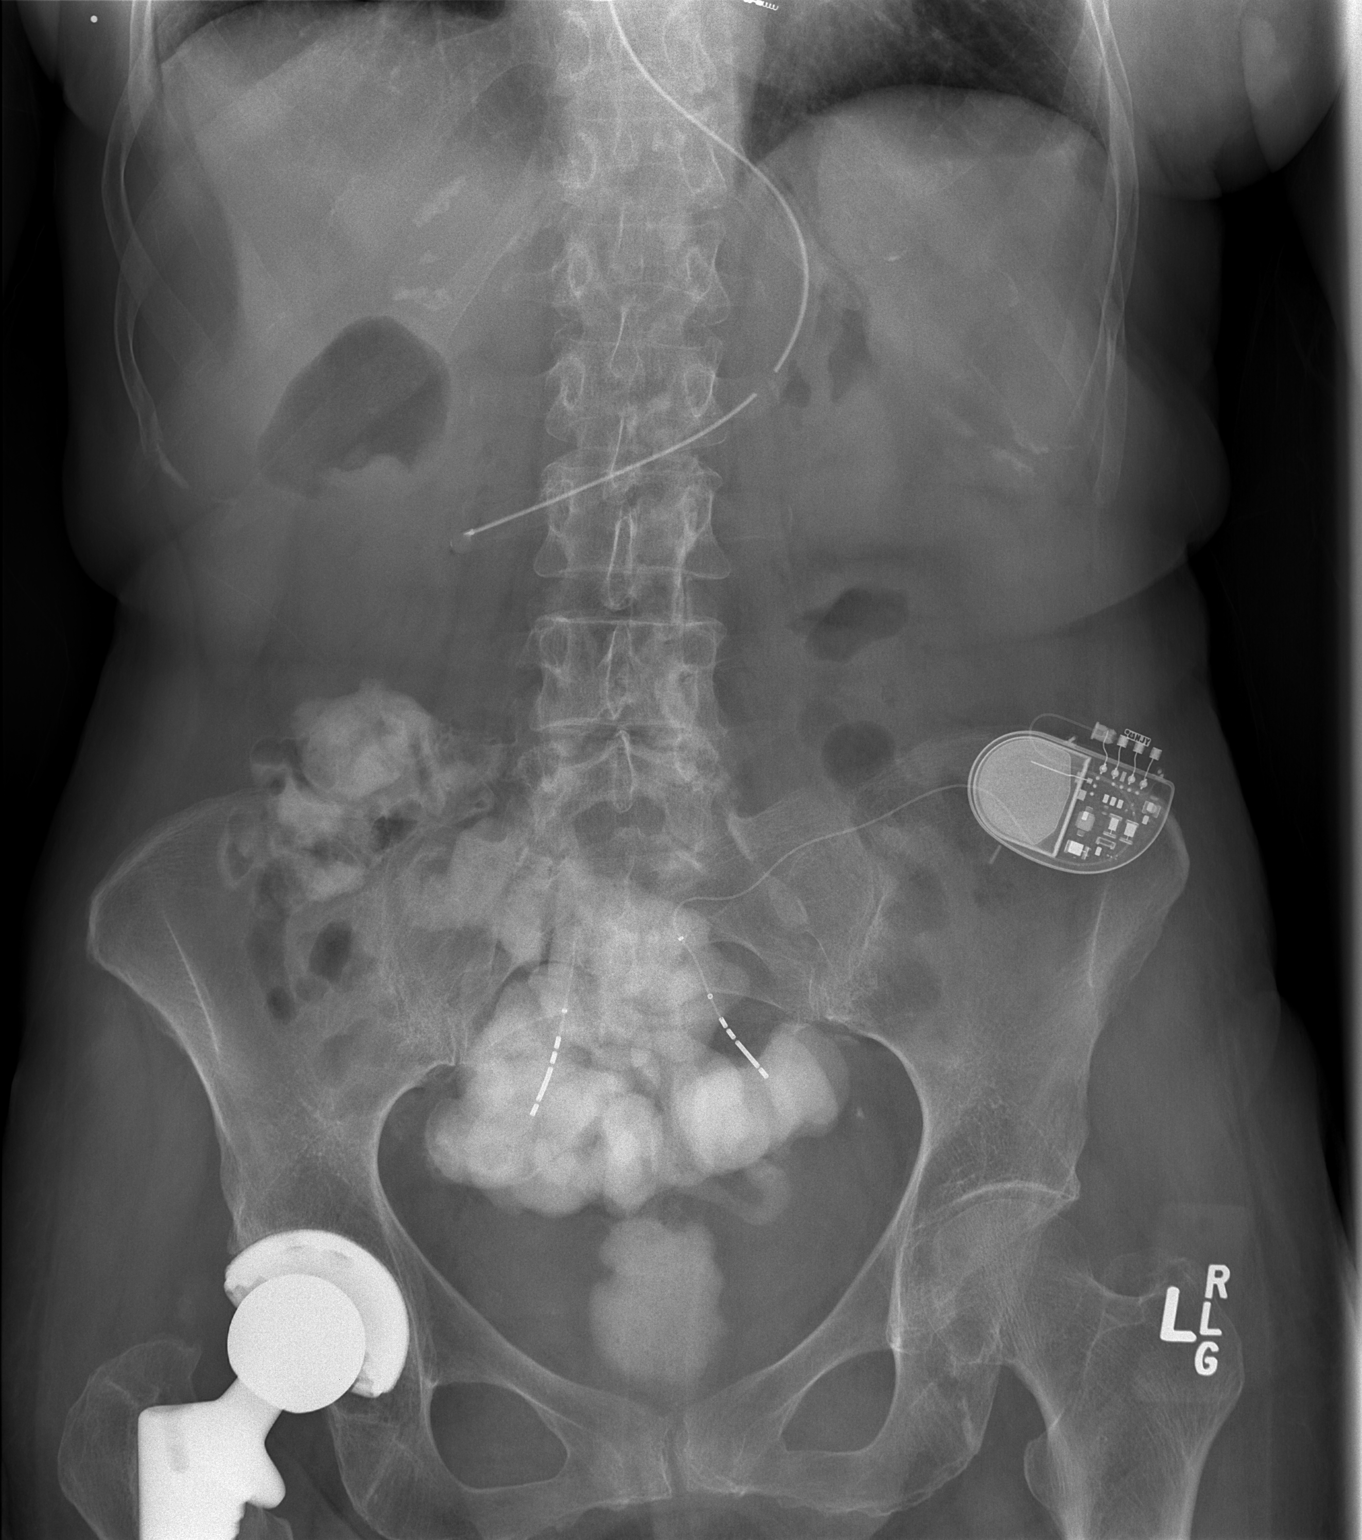

[2 of 2 positions shown; findings below may reference images not displayed]

FINDINGS: Enteric tube in the distal stomach.

Nonobstructive bowel gas pattern.

Contrast within the cecum, appendix, and likely sigmoid
colon/rectum.

Sacral stimulator.
IMPRESSION: Enteric tube in the distal stomach.

Nonobstructive bowel gas pattern.

Contrast within the colon, as above.

## 2015-03-22 ENCOUNTER — Ambulatory Visit (INDEPENDENT_AMBULATORY_CARE_PROVIDER_SITE_OTHER): Payer: PPO | Admitting: Internal Medicine

## 2015-03-22 ENCOUNTER — Encounter: Payer: Self-pay | Admitting: Internal Medicine

## 2015-03-22 VITALS — BP 160/82 | HR 84 | Ht 60.0 in | Wt 103.6 lb

## 2015-03-22 DIAGNOSIS — I480 Paroxysmal atrial fibrillation: Secondary | ICD-10-CM | POA: Diagnosis not present

## 2015-03-22 DIAGNOSIS — I5022 Chronic systolic (congestive) heart failure: Secondary | ICD-10-CM | POA: Diagnosis not present

## 2015-03-22 DIAGNOSIS — Z950 Presence of cardiac pacemaker: Secondary | ICD-10-CM | POA: Diagnosis not present

## 2015-03-22 LAB — CUP PACEART INCLINIC DEVICE CHECK
Battery Remaining Longevity: 45 mo
Battery Voltage: 2.97 V
Brady Statistic AP VP Percent: 0.13 %
Brady Statistic AP VS Percent: 0 %
Brady Statistic AS VP Percent: 99.81 %
Brady Statistic AS VS Percent: 0.06 %
Brady Statistic RA Percent Paced: 0.13 %
Brady Statistic RV Percent Paced: 99.94 %
Date Time Interrogation Session: 20170103131840
Implantable Lead Implant Date: 20111005
Implantable Lead Implant Date: 20111005
Implantable Lead Implant Date: 20111005
Implantable Lead Location: 753858
Implantable Lead Location: 753859
Implantable Lead Location: 753860
Implantable Lead Model: 4196
Implantable Lead Model: 5076
Implantable Lead Model: 5076
Lead Channel Impedance Value: 304 Ohm
Lead Channel Impedance Value: 342 Ohm
Lead Channel Impedance Value: 418 Ohm
Lead Channel Impedance Value: 437 Ohm
Lead Channel Impedance Value: 456 Ohm
Lead Channel Impedance Value: 494 Ohm
Lead Channel Impedance Value: 513 Ohm
Lead Channel Impedance Value: 532 Ohm
Lead Channel Impedance Value: 741 Ohm
Lead Channel Pacing Threshold Amplitude: 0.5 V
Lead Channel Pacing Threshold Amplitude: 0.5 V
Lead Channel Pacing Threshold Amplitude: 0.75 V
Lead Channel Pacing Threshold Pulse Width: 0.4 ms
Lead Channel Pacing Threshold Pulse Width: 0.4 ms
Lead Channel Pacing Threshold Pulse Width: 0.8 ms
Lead Channel Sensing Intrinsic Amplitude: 1.125 mV
Lead Channel Sensing Intrinsic Amplitude: 8.125 mV
Lead Channel Setting Pacing Amplitude: 1.25 V
Lead Channel Setting Pacing Amplitude: 2 V
Lead Channel Setting Pacing Amplitude: 2.5 V
Lead Channel Setting Pacing Pulse Width: 0.4 ms
Lead Channel Setting Pacing Pulse Width: 0.8 ms
Lead Channel Setting Sensing Sensitivity: 0.9 mV

## 2015-03-22 MED ORDER — CARVEDILOL 12.5 MG PO TABS: 12.5000 mg | ORAL_TABLET | Freq: Two times a day (BID) | ORAL | Status: DC

## 2015-03-22 NOTE — Progress Notes (Signed)
Patient Care Team: Rosalita Chessman, DO as PCP - General Rolm Bookbinder, MD as Consulting Physician (General Surgery) Lafayette Dragon, MD as Consulting Physician (Gastroenterology) Gatha Mayer, MD as Consulting Physician (Gastroenterology) Rutherford Guys, MD as Consulting Physician (Ophthalmology) Crista Luria, MD as Consulting Physician (Dermatology) Deboraha Sprang, MD as Consulting Physician (Cardiology) Minus Breeding, MD as Consulting Physician (Cardiology)   HPI  [name] is a 70 y.o. female seen in followup for congestive heart failure in the setting of nonischemic cardiomyopathy. She underwent CRT-P implantation October 2011. This has been associated with significant improvement although complicated by left upper extremity deep venous thrombosis. She also has infrequent diaphragmatic stimulation this prompted a variety of programming changes   She also has a history of atrial arrhythmias for which she was previously  treated with amiodarone now discontinued..  She has oxygen-dependent COPD   she has no significant complaints  All her children age 55--18 ( last four adopted) were together for Christmas   Last echocardiogram demonstrated ejection fracton of 60-65% during hospitalization 3/16  Past Medical History  Diagnosis Date  . GERD (gastroesophageal reflux disease)   . CAD (coronary artery disease)     mild disease per cath in 2011  . Venous embolism and thrombosis of subclavian vein ([place])     after pacemaker insertion Oct 2011  . Pleural effusion      s/p right thoracentesis 03/09  . Small bowel obstruction ([place])   . Urinary incontinence      -INTERSTIM IMPLANT NOT FUNCTIONING PER PT  . RLS (restless legs syndrome)   . Primary dilated cardiomyopathy (HCC)     EF 45 to 50% per echo in Jan 2012  . Vitamin B12 deficiency   . Gastroparesis   . Chronic nausea   . Chronic abdominal pain   . Chronic pain syndrome   . Pacemaker     CRT therapy; followed by Dr.  Caryl Comes  . H/O: GI bleed     from Pradaxa  . Oxygen dependent     2 liters via nasal cannula at all times  . CHF (congestive heart failure) ([place])   . Anemia   . Hx of stress fx aug 2011    right hip   . Elbow fracture, left aug 2012  . Arthritis     right hip  . Hx of gastric ulcer   . Diverticulitis   . HTN (hypertension)     under control; has been on med. x "years"  . Hx MRSA infection   . Pneumonia - 03/09, 12/09, 02/10, 06/10    MOST RECENT FEB 2013  . Elevated liver enzymes   . Blood transfusion   . Anginal pain ([place])   . Fibromyalgia     COUPLE OF TIMES A YEAR-PT HAS EPISODES OF CONFUSION-USUALLY INVOLVES DAY/NIGHT REVERSAL AND EPISODES OF TWITCHING AND FALLS--SEES DR. Jacelyn Grip - NEUROLOGIST-LAST SEEN 07/10/11  . LBBB (left bundle branch block)   . Atrial fibrillation ([place])   . COPD (chronic obstructive pulmonary disease) (HCC)     continuous O2- 2l  . Hepatitis     hx of in high school   . Silent aspiration     Past Surgical History  Procedure Laterality Date  . Appendectomy    . Pacemaker placement  12/21/2009  . Peg removed      with complications  . Tonsillectomy  age 48  . Interstim implant revision  03/06/2011    Procedure: REVISION OF INTERSTIM;  Surgeon:  Reece Packer, MD;  Location: [facility];  Service: Urology;  Laterality: N/A;  Replacement of Neurostimulator  . Portacath placement  12/16/2009  . Total abdominal hysterectomy      complete  . Interstim implant placement  05/28/2006 - stage I    06/05/2006 - stage II  . Interstim implant revision  10/23/2007  . Cystoscopy with injection  04/30/2006    transurethral collagen injection; incision vaginal stenosis  . Small intestine surgery  05/20/2001    ex. lap., resection of small bowel stricture; gastrostomy; insertion central line  . Cardiac catheterization  04/08/2006, 11/16/2009  . Cystoscopy  11/11/2008  . Cystoscopy w/ retrogrades  11/11/2008    right  . Colectomy      Intestinal resection (5times)  .  Colectomy  02/04/2000    ex. lap., intra-abd. subtotal colectomy with ileosigmoid colon anastomosies and lysis of adhesions  . Gastrocutaneous fistula closure    . Exploratory laparotomy  04/27/2009    lysis of adhesions, gastrostomy tube  . Port-a-cath removal  07/27/2011    Procedure: REMOVAL PORT-A-CATH;  Surgeon: Rolm Bookbinder, MD;  Location: [facility];  Service: General;  Laterality: N/A;  . Total hip arthroplasty  08/03/2011    Procedure: TOTAL HIP ARTHROPLASTY ANTERIOR APPROACH;  Surgeon: Mcarthur Rossetti, MD;  Location: [facility];  Service: Orthopedics;  Laterality: Right;  . Interstim implant placement  02/05/2012    Procedure: INTERSTIM IMPLANT FIRST STAGE;  Surgeon: Reece Packer, MD;  Location: [facility];  Service: Urology;  Laterality: N/A;  Replacement of Interstim Lead     . Esophagogastroduodenoscopy N/A 02/02/2013    Procedure: ESOPHAGOGASTRODUODENOSCOPY (EGD);  Surgeon: Lafayette Dragon, MD;  Location: [facility];  Service: Endoscopy;  Laterality: N/A;  . Savory dilation N/A 02/02/2013    Procedure: SAVORY DILATION;  Surgeon: Lafayette Dragon, MD;  Location: [facility];  Service: Endoscopy;  Laterality: N/A;    Current Outpatient Prescriptions  Medication Sig Dispense Refill  . albuterol (PROAIR HFA) 108 (90 BASE) MCG/ACT inhaler Inhale 2 puffs into the lungs every 6 (six) hours as needed for wheezing or shortness of breath. 1 Inhaler 3  . AMBULATORY NON FORMULARY MEDICATION Pt takes Oxygen, 2 units, every day    . calcium carbonate (OS-CAL - DOSED IN MG OF ELEMENTAL CALCIUM) 1250 (500 CA) MG tablet Take 2 tablets (1,000 mg of elemental calcium total) by mouth 3 (three) times daily with meals. 180 tablet 0  . carvedilol (COREG) 6.25 MG tablet Take 1 tablet (6.25 mg total) by mouth 2 (two) times daily with a meal. 60 tablet 3  . folic acid (FOLVITE) 1 MG tablet Take 1 mg by mouth daily.    . furosemide (LASIX) 40 MG tablet TAKE ONE TABLET BY MOUTH IN THE  MORNING 90 tablet 1  . gabapentin (NEURONTIN) 600 MG tablet TAKE ONE TABLET BY MOUTH 4 TIMES DAILY 120 tablet 11  . loperamide (IMODIUM A-D) 2 MG tablet Take 1/2 tablet by mouth daily as needed for diarrhea. 30 tablet 2  . methotrexate 2.5 MG tablet Take 10 mg by mouth once a week.    [name] MYRBETRIQ 25 MG TB24 tablet Take 1 tablet by mouth daily.    . nitroGLYCERIN (NITROSTAT) 0.4 MG SL tablet Place 0.4 mg under the tongue every 5 (five) minutes as needed. CHEST PAIN    . oxyCODONE-acetaminophen (PERCOCET) 10-325 MG tablet Take 1 tablet by mouth every 4 (four) hours as needed. 120 tablet 0  . pantoprazole (  PROTONIX) 40 MG tablet TAKE ONE TABLET BY MOUTH TWICE DAILY 60 tablet 5  . promethazine (PHENERGAN) 25 MG tablet TAKE ONE TABLET BY MOUTH EVERY 8 HOURS AS NEEDED FOR NAUSEA 40 tablet 1  . tiotropium (SPIRIVA HANDIHALER) 18 MCG inhalation capsule Place 1 capsule (18 mcg total) into inhaler and inhale every morning. 10 capsule 0  . tiZANidine (ZANAFLEX) 4 MG tablet Take 1 tablet (4 mg total) by mouth 3 (three) times daily as needed. for muscle spams 90 tablet 1  . zolpidem (AMBIEN) 5 MG tablet Take 1 tablet (5 mg total) by mouth at bedtime as needed. 30 tablet 2  . [DISCONTINUED] iron dextran complex (INFED) 50 MG/ML injection Please give infed infusion (no test dose needed) over 4 hours per pharmacy calculated dose. Ht: 5'2 Wt: 99 pounds Hgb: 12 1 mL 0   No current facility-administered medications for this visit.    Allergies  Allergen Reactions  . Dabigatran Etexilate Mesylate Other (See Comments)    INTERNAL BLEEDING-pradaxa  . Talwin [Pentazocine] Other (See Comments)    hallucinations  . Pentazocine Lactate Other (See Comments)    HALLUCINATIONS  . Sulfonamide Derivatives Other (See Comments)    JITTERINESS     Review of Systems negative except from HPI and PMH  Physical Exam BP 160/82 mmHg  Pulse 84  Ht 5' (1.524 m)  Wt 103 lb 9.6 oz (46.993 kg)  BMI 20.23 kg/m2 Well  developed and nourished in no acute distress HENT normal Neck supple with JVP-flat Clear Device pocket well healed; without hematoma or erythema.  There is no tethering cevice with migration into arm pit Regular rate and rhythm, no murmurs or gallops Abd-soft with active BS No Clubbing cyanosis edema Skin-warm and dry A & Oriented  Grossly normal sensory and motor function     Assessment and  Plan Nonischemic cardiomyopathy interval resolution  Congestive heart failure  Euvolemic continue current meds  Atrial arrhythmia  littlel by device interrogation  Pacemaker-CRT-[employer] The patient's device was interrogated.  The information was reviewed. No changes were made in the programming.     Diaphragmatic stimulation  Euvolemic continue current meds  She has adopted 4 special needs kdis

## 2015-03-22 NOTE — Patient Instructions (Addendum)
Medication Instructions: 1) Increase coreg (carvedilol) to 12.5 mg one tablet by mouth twice daily  Labwork: - none  Procedures/Testing: - none  Follow-Up: - Your physician wants you to follow-up in: 6 months with the [facility] 1 year with Dr. Caryl Comes. You will receive a reminder letter in the mail two months in advance. If you don't receive a letter, please call our office to schedule the follow-up appointment.  Any Additional Special Instructions Will Be Listed Below (If Applicable). -

## 2015-03-25 ENCOUNTER — Other Ambulatory Visit: Payer: Self-pay | Admitting: Family Medicine

## 2015-03-25 DIAGNOSIS — M810 Age-related osteoporosis without current pathological fracture: Secondary | ICD-10-CM

## 2015-03-25 MED ORDER — DENOSUMAB 60 MG/ML ~~LOC~~ SOLN: 60.0000 mg | Freq: Once | SUBCUTANEOUS | Status: DC

## 2015-03-29 ENCOUNTER — Ambulatory Visit (INDEPENDENT_AMBULATORY_CARE_PROVIDER_SITE_OTHER): Payer: PPO | Admitting: Pulmonary Disease

## 2015-03-29 ENCOUNTER — Encounter: Payer: Self-pay | Admitting: Pulmonary Disease

## 2015-03-29 VITALS — BP 152/82 | HR 83 | Ht 60.5 in | Wt 105.6 lb

## 2015-03-29 DIAGNOSIS — J9611 Chronic respiratory failure with hypoxia: Secondary | ICD-10-CM | POA: Diagnosis not present

## 2015-03-29 DIAGNOSIS — J432 Centrilobular emphysema: Secondary | ICD-10-CM

## 2015-03-29 NOTE — Progress Notes (Signed)
Current Outpatient Prescriptions on File Prior to Visit  Medication Sig  . albuterol (PROAIR HFA) 108 (90 BASE) MCG/ACT inhaler Inhale 2 puffs into the lungs every 6 (six) hours as needed for wheezing or shortness of breath.  . AMBULATORY NON FORMULARY MEDICATION Pt takes Oxygen, 2 units, every day  . calcium carbonate (OS-CAL - DOSED IN MG OF ELEMENTAL CALCIUM) 1250 (500 CA) MG tablet Take 2 tablets (1,000 mg of elemental calcium total) by mouth 3 (three) times daily with meals.  . carvedilol (COREG) 12.5 MG tablet Take 1 tablet (12.5 mg total) by mouth 2 (two) times daily.  [name] denosumab (PROLIA) 60 MG/ML SOLN injection Inject 60 mg into the skin once. Administer in upper arm, thigh, or abdomen (Patient taking differently: Inject 60 mg into the skin every 6 (six) months. Administer in upper arm, thigh, or abdomen)  . folic acid (FOLVITE) 1 MG tablet Take 1 mg by mouth daily.  . furosemide (LASIX) 40 MG tablet TAKE ONE TABLET BY MOUTH IN THE MORNING  . gabapentin (NEURONTIN) 600 MG tablet TAKE ONE TABLET BY MOUTH 4 TIMES DAILY  . loperamide (IMODIUM A-D) 2 MG tablet Take 1/2 tablet by mouth daily as needed for diarrhea.  . methotrexate 2.5 MG tablet Take 10 mg by mouth once a week.  [name] MYRBETRIQ 25 MG TB24 tablet Take 1 tablet by mouth daily.  . nitroGLYCERIN (NITROSTAT) 0.4 MG SL tablet Place 0.4 mg under the tongue every 5 (five) minutes as needed. CHEST PAIN  . oxyCODONE-acetaminophen (PERCOCET) 10-325 MG tablet Take 1 tablet by mouth every 4 (four) hours as needed.  . pantoprazole (PROTONIX) 40 MG tablet TAKE ONE TABLET BY MOUTH TWICE DAILY  . promethazine (PHENERGAN) 25 MG tablet TAKE ONE TABLET BY MOUTH EVERY 8 HOURS AS NEEDED FOR NAUSEA  . tiotropium (SPIRIVA HANDIHALER) 18 MCG inhalation capsule Place 1 capsule (18 mcg total) into inhaler and inhale every morning.  [name] tiZANidine (ZANAFLEX) 4 MG tablet Take 1 tablet (4 mg total) by mouth 3 (three) times daily as needed. for muscle spams  .  zolpidem (AMBIEN) 5 MG tablet Take 1 tablet (5 mg total) by mouth at bedtime as needed.  . [DISCONTINUED] iron dextran complex (INFED) 50 MG/ML injection Please give infed infusion (no test dose needed) over 4 hours per pharmacy calculated dose. Ht: 5'2 Wt: 99 pounds Hgb: 12   No current facility-administered medications on file prior to visit.     Chief Complaint  Patient presents with  . Follow-up    No new complaints. Using Spiriva as directed with prn Proair and 2 liters O2 pulsed.       Tests 06/28/08 PFT >> FEV1 1.95 (97%), FEV1% 70, TLC 3.88 (87%), DLCO 64% ONO 02/11/10 >> Test time 9 hrs 9 min, Mean SpO2 87%, low SpO2 80%, Spent 5hrs 57 min (65%) with SpO2 < 88%. 10/10/11 PFT >> FEV1 1.15 (59%), FEV1% 64, TLC 3.26 (73%), DLCO 35%, + BD CT chest 04/30/2011 >> Chronic multifocal nodular and patchy airspace densities throughout the right lung with mild BTX. Echo 05/18/14 >> EF 60 to 95%, grade 1 diastolic dysfx, PAS 30 mmHg  Past medical hx GERD, Gastroparesis, PUD, CAD, Dilated CM, HTN, s/p PM, LBBB, Atrial fibrillation, RLS, Chronic pain, Fibromyalgia, Psoriasis  Past surgical hx, Allergies, Family hx, Social hx all reviewed.  Vital Signs BP 152/82 mmHg  Pulse 83  Ht 5' 0.5" (1.537 m)  Wt 105 lb 9.6 oz (47.9 kg)  BMI 20.28 kg/m2  SpO2  98%  History of Present Illness AERICA Ross is a 70 y.o. female former smoker with COPD/emphysema, chronic hypoxic respiratory failure, and recurrent pneumonia with focal bronchiectasis.  She has been doing well.  No recent episodes of pneumonia.  She was last in hospital in March for abdominal symptoms of SBO.  She is using 2 liters oxygen with activity and sleep.  She denies chest pain, wheeze, fever, or hemoptysis.  She was started on MTX for plaque psoriasis and this has helped.   Physical Exam  General - No distress, thin, wearing oxygen ENT - No sinus tenderness, no oral exudate, no LAN Cardiac - s1s2 regular, no  murmur Chest - decreased breath sounds, no wheeze Back - No focal tenderness Abd - Soft, non-tender Ext - No edema Neuro - Normal strength Skin - No rashes Psych - normal mood, and behavior   Assessment/Plan  COPD with emphysema. Plan: - continue spiriva and prn albuterol   Chronic respiratory failure with hypoxia. Plan: - continue 2 liters oxygen with activity and sleep  Plaque psoriasis. Plan: - she is to continue on MTX per dermatology    Patient Instructions  Follow up in 1 year     Chesley Mires, MD [place] Pager:  [phone]

## 2015-03-29 NOTE — Patient Instructions (Signed)
Follow up in 1 year.

## 2015-05-03 ENCOUNTER — Telehealth: Payer: Self-pay | Admitting: Family Medicine

## 2015-05-03 DIAGNOSIS — R52 Pain, unspecified: Secondary | ICD-10-CM

## 2015-05-03 MED ORDER — OXYCODONE-ACETAMINOPHEN 10-325 MG PO TABS: 1.0000 | ORAL_TABLET | ORAL | Status: DC | PRN

## 2015-05-03 NOTE — Telephone Encounter (Signed)
Last seen and filled 03/04/15 #120 UDS 08/09/14 low risk   Please advise     KP

## 2015-05-03 NOTE — Telephone Encounter (Signed)
patient aware RX ready for pick up.     KP

## 2015-05-03 NOTE — Telephone Encounter (Signed)
Relation to GO:[facility] Call back number: [phone]    Reason for call:  Patient requesting a refill oxyCODONE-acetaminophen (PERCOCET) 10-325 MG tablet, patient states she has 9 days left and she's just being pro active.

## 2015-05-03 NOTE — Telephone Encounter (Signed)
Refill x1 

## 2015-05-13 ENCOUNTER — Telehealth: Payer: Self-pay | Admitting: Pulmonary Disease

## 2015-05-13 NOTE — Telephone Encounter (Signed)
Please advise Dr Halford Chessman. Form in your look at.

## 2015-05-13 NOTE — Telephone Encounter (Signed)
Airline papers received by Vallarie Mare, given to [employer] for VS to fill out.  Will route message to Ashtyn to follow up on.

## 2015-05-13 NOTE — Telephone Encounter (Signed)
Form completed.

## 2015-05-16 DIAGNOSIS — H26491 Other secondary cataract, right eye: Secondary | ICD-10-CM | POA: Diagnosis not present

## 2015-05-16 DIAGNOSIS — Z961 Presence of intraocular lens: Secondary | ICD-10-CM | POA: Diagnosis not present

## 2015-05-16 DIAGNOSIS — H524 Presbyopia: Secondary | ICD-10-CM | POA: Diagnosis not present

## 2015-05-16 NOTE — Telephone Encounter (Signed)
Looked in Dr. Juanetta Gosling box and on his desk in his office, did not see form. Ashtyn, do you have this form?

## 2015-05-17 NOTE — Telephone Encounter (Signed)
Pt aware form is done - plans to come pick up later this week. Form copied and placed in scan. Originals placed up front to be picked up. Nothing further needed.

## 2015-05-19 DIAGNOSIS — Z23 Encounter for immunization: Secondary | ICD-10-CM | POA: Diagnosis not present

## 2015-05-19 DIAGNOSIS — Z79899 Other long term (current) drug therapy: Secondary | ICD-10-CM | POA: Diagnosis not present

## 2015-05-19 DIAGNOSIS — L400 Psoriasis vulgaris: Secondary | ICD-10-CM | POA: Diagnosis not present

## 2015-05-26 DIAGNOSIS — L400 Psoriasis vulgaris: Secondary | ICD-10-CM | POA: Diagnosis not present

## 2015-05-26 DIAGNOSIS — Z5181 Encounter for therapeutic drug level monitoring: Secondary | ICD-10-CM | POA: Diagnosis not present

## 2015-05-27 IMAGING — CT CT ABD-PELV W/O CM
2 of 4 series · 15 of 46 positions shown, 17 images · non-contrast
Comparison: Plain film of 05/20/2014. Most recent CT of 05/17/2014.

ADDENDUM:
Not mentioned in the impression section is intra and less so
extrahepatic biliary ductal dilatation. Given chronicity back to
12/11/2011 this could be within normal variation in this patient. If
the bilirubin level is elevated, or there is a clinical concern of
biliary obstruction, ERCP could be performed.
CLINICAL DATA: History of small bowel obstructions and abdominal
pain. Vomiting. Poor IV access.

EXAM:
CT ABDOMEN AND PELVIS WITHOUT CONTRAST
TECHNIQUE: Multidetector CT imaging of the abdomen and pelvis was performed
following the standard protocol without IV contrast.

[Series 2: ap without · axial · non-contrast · 0.62mm/px · z∈[-414,-64]mm · 12 of 81 slices shown, 14 images]
[im 7/81  soft-tissue]
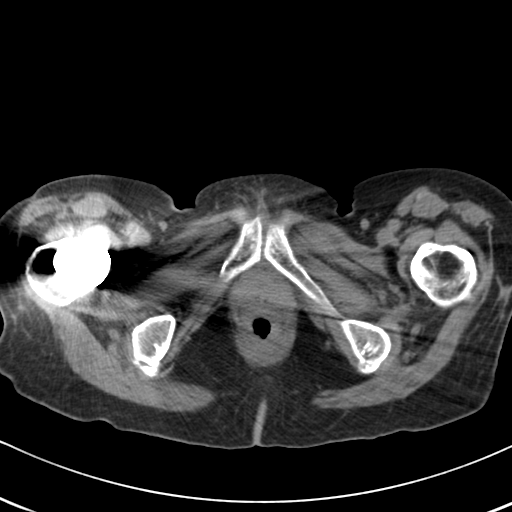
[im 7/81  bone]
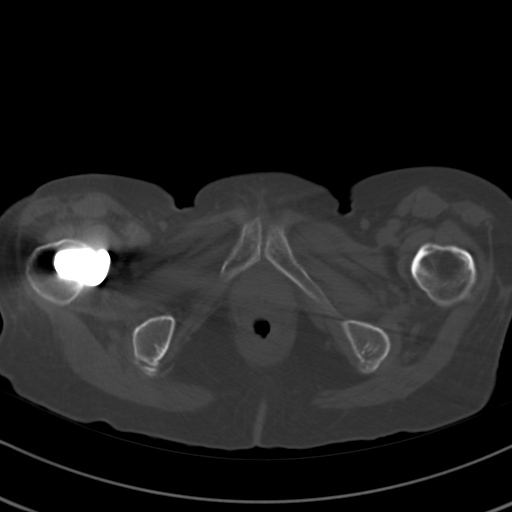
[im 13/81  soft-tissue]
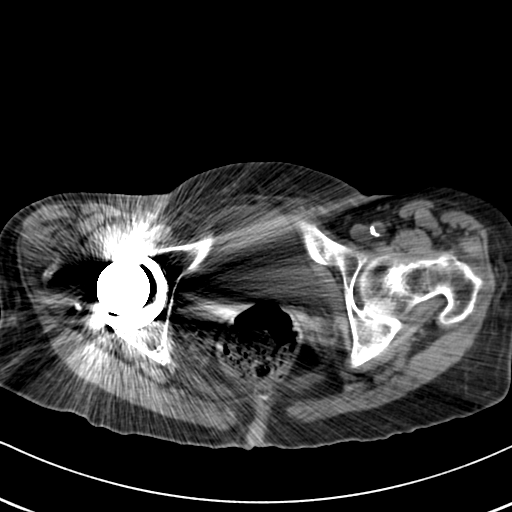
[im 20/81  soft-tissue]
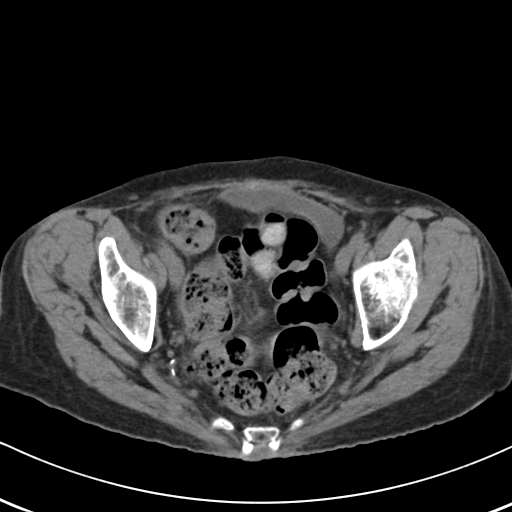
[im 26/81  soft-tissue]
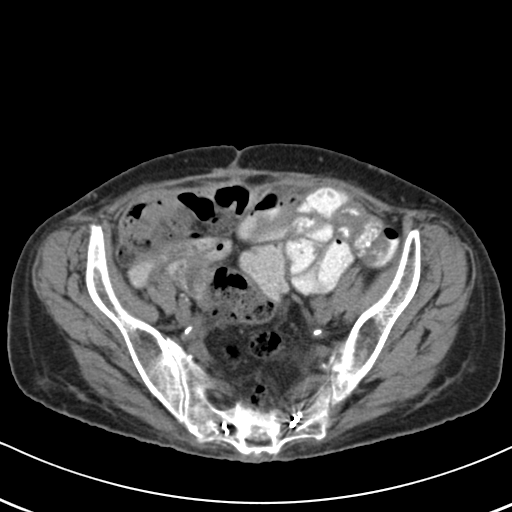
[im 33/81  soft-tissue]
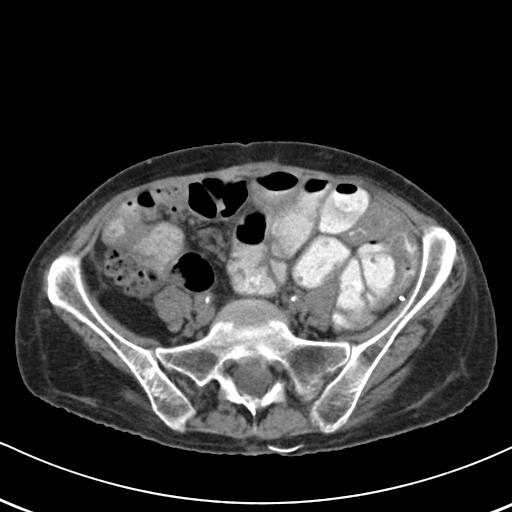
[im 39/81  soft-tissue]
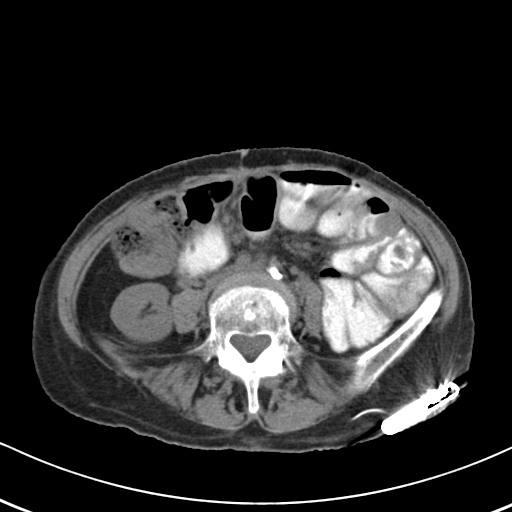
[im 45/81  soft-tissue]
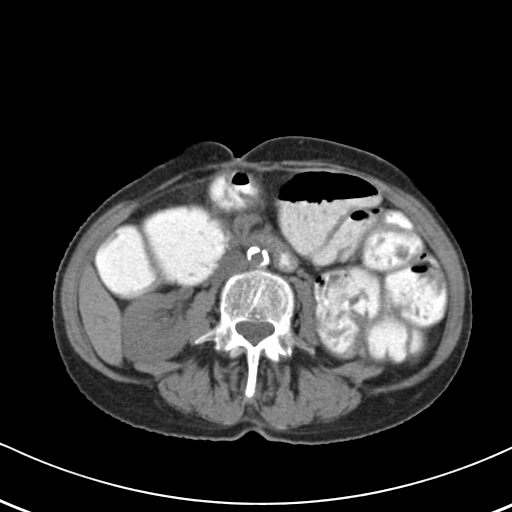
[im 52/81  soft-tissue]
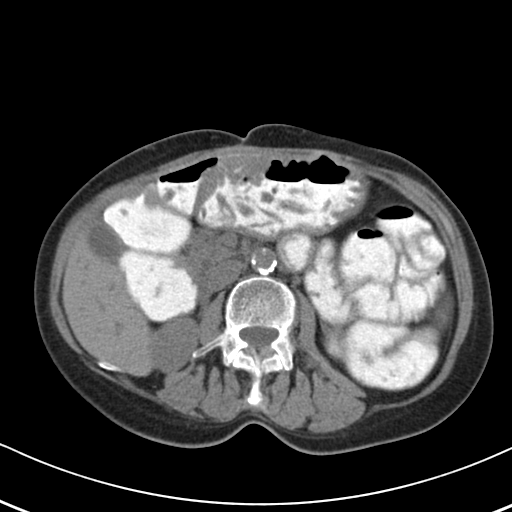
[im 58/81  soft-tissue]
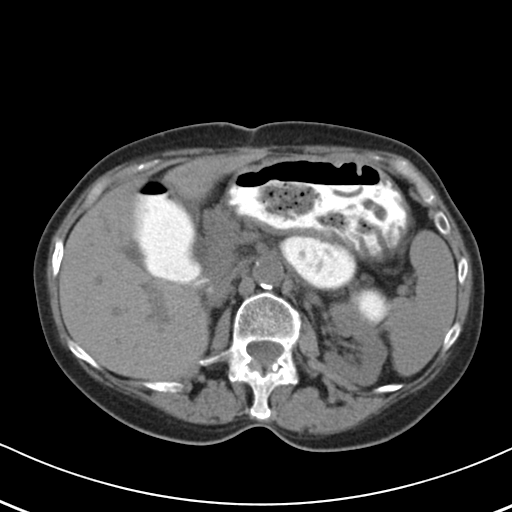
[im 58/81  bone]
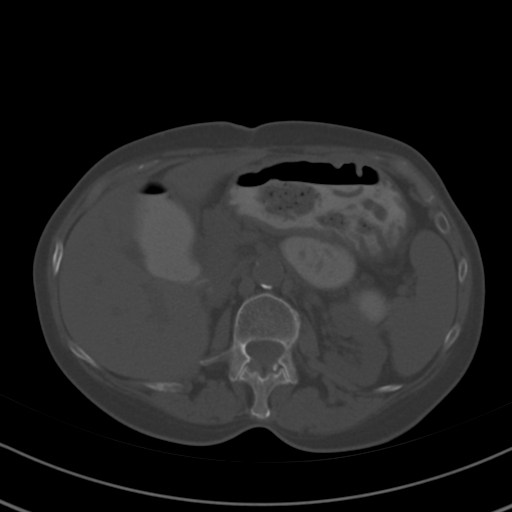
[im 65/81  soft-tissue]
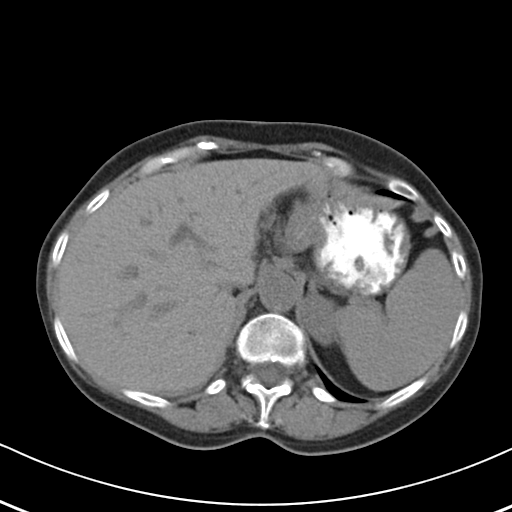
[im 71/81  soft-tissue]
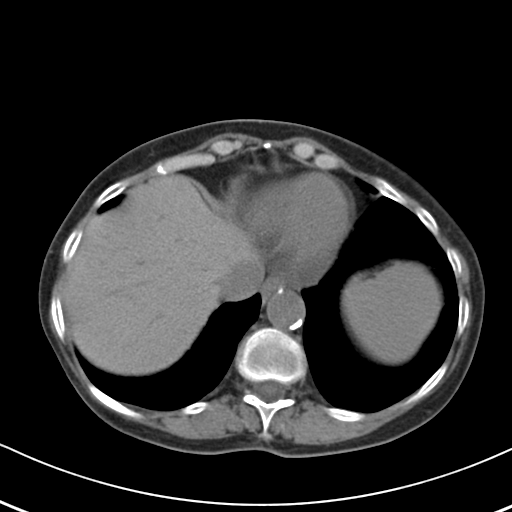
[im 77/81  soft-tissue]
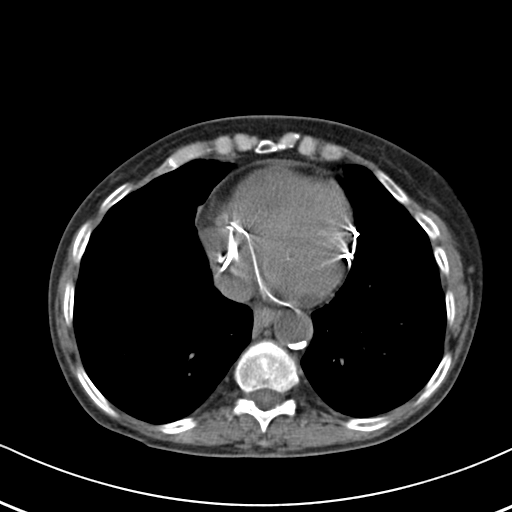

[Series 602: cor · coronal · 0.83mm/px · 3 of 89 slices shown]
[im 30/89  soft-tissue]
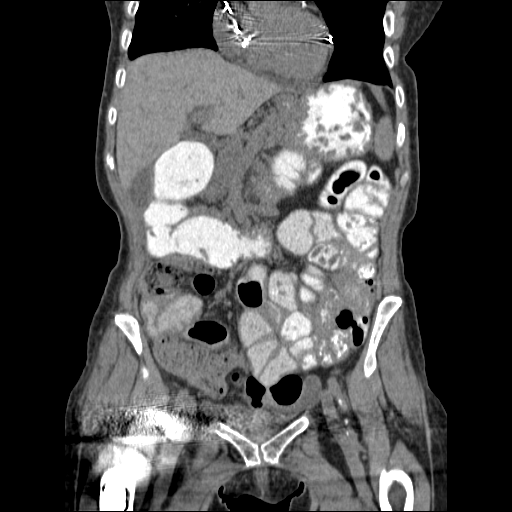
[im 40/89  soft-tissue]
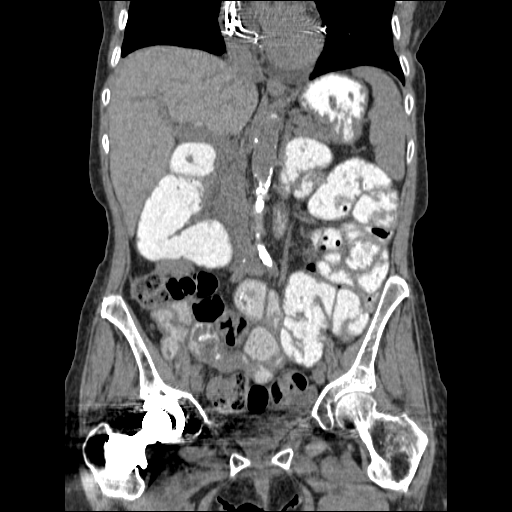
[im 49/89  soft-tissue]
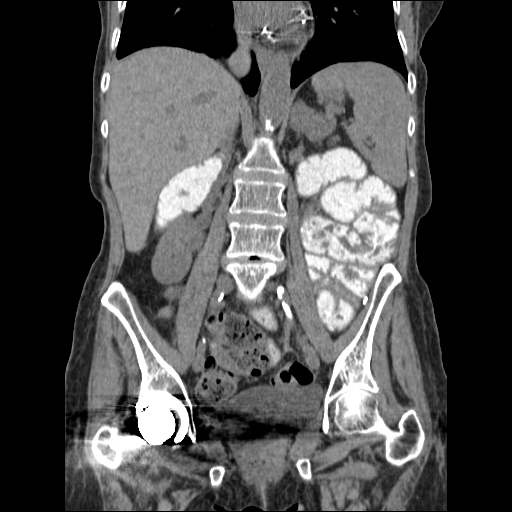

[15 of 46 positions shown; findings below may reference images not displayed]

FINDINGS: Lower chest: Emphysema. Bibasilar interstitial thickening may be
slightly progressive, including in the right lower lobe on image 5
and in the right middle lobe also on image 5. Left base nodularity
is felt to be similar and is likely post infectious or inflammatory.

Normal heart size without pericardial or pleural effusion.
Incompletely imaged pacer.

Hepatobiliary: No focal liver lesion. Moderate intrahepatic biliary
ductal dilatation is grossly similar. No calcified gallstones. The
common duct is not well evaluated secondary to precontrast
technique. Felt to be similar, on the order of 9 mm on coronal image
36.

Pancreas: Grossly normal pancreas, without duct dilatation.

Spleen: Normal

Adrenals/Urinary Tract: Normal adrenal glands. No renal calculi or
hydronephrosis. Degraded evaluation of the pelvis, secondary to beam
hardening artifact from right hip arthroplasty. Grossly normal
urinary bladder.

Stomach/Bowel: Mild gastric distension. Defect from prior
gastrostomy is similar on image 31. The colon is normal in caliber
and stool filled. At least partial left hemicolectomy.

Moderate distension of the proximal duodenum, at up to 5.1 cm.
Apparent transition identified within the transverse duodenum on
image 40 of series 2 (at the site of traversing the aorta and
crossing under the superior mesenteric artery). The jejunum
subsequent becomes mildly dilated proximally, on the order of
cm. No focal transition point is identified. Mid and distal small
bowel loops are normal in caliber.

Vascular/Lymphatic: Advanced aortic and branch vessel
atherosclerosis. Limited evaluation for retroperitoneal adenopathy,
given lack of IV contrast and paucity of abdominal fat. No pelvic
adenopathy.

Reproductive: Hysterectomy.  No adnexal mass.

Other: No significant free fluid.

Musculoskeletal: Right hip arthroplasty. Osteopenia. Presacral
stimulator.
IMPRESSION: 1. Mild degradation secondary lack of IV contrast, right hip
arthroplasty and paucity of abdominal fat.
2. Mild proximal small bowel dilatation with gradual transition to
normal caliber distal small bowel. Considerations include low-grade
partial small bowel obstruction or relatively localized adynamic
ileus.
3. Postsurgical changes of partial colectomy.
4. Mild gastric and moderate proximal duodenal distension with
transition identified in the transverse duodenum. This appearance
can be seen with superior mesenteric artery syndrome. Clinically
correlate.
5. Centrilobular emphysema at the lung bases with interstitial
thickening and nodularity. Felt to be slightly progressive.
Correlate with acute or subacute infectious symptoms. Also
clinically exclude aspiration, given the clinical history of
vomiting.

## 2015-06-01 ENCOUNTER — Other Ambulatory Visit: Payer: Self-pay | Admitting: Family Medicine

## 2015-06-01 DIAGNOSIS — H2640 Unspecified secondary cataract: Secondary | ICD-10-CM | POA: Diagnosis not present

## 2015-06-01 DIAGNOSIS — H26491 Other secondary cataract, right eye: Secondary | ICD-10-CM | POA: Diagnosis not present

## 2015-06-01 NOTE — Telephone Encounter (Signed)
Rx sent to the pharmacy by e-script.//AB/CMA 

## 2015-06-07 ENCOUNTER — Telehealth: Payer: Self-pay | Admitting: Family Medicine

## 2015-06-07 DIAGNOSIS — R52 Pain, unspecified: Secondary | ICD-10-CM

## 2015-06-07 NOTE — Telephone Encounter (Signed)
Last seen 03/04/15 and filled 05/03/15 #120 UDS 08/09/14 low risk   Please advise    KP

## 2015-06-07 NOTE — Telephone Encounter (Signed)
Refill x1 

## 2015-06-07 NOTE — Telephone Encounter (Signed)
Relation to pt:self °Call back number: [phone] ° ° °Reason for call:  °Patient requesting a refill oxyCODONE-acetaminophen (PERCOCET) 10-325 MG tablet  ° °

## 2015-06-08 MED ORDER — OXYCODONE-ACETAMINOPHEN 10-325 MG PO TABS: 1.0000 | ORAL_TABLET | ORAL | Status: DC | PRN

## 2015-06-08 NOTE — Telephone Encounter (Signed)
Patient aware Rx will be ready for pick up tomorrow.      KP 

## 2015-06-15 ENCOUNTER — Telehealth: Payer: Self-pay | Admitting: Family Medicine

## 2015-06-15 NOTE — Telephone Encounter (Signed)
[facility] Ph # [phone], is going to fax over PA form for prolia

## 2015-06-16 ENCOUNTER — Other Ambulatory Visit: Payer: Self-pay | Admitting: Family Medicine

## 2015-06-16 ENCOUNTER — Other Ambulatory Visit: Payer: Self-pay

## 2015-06-16 MED ORDER — ZOLPIDEM TARTRATE 5 MG PO TABS: 5.0000 mg | ORAL_TABLET | Freq: Every evening | ORAL | Status: DC | PRN

## 2015-06-20 NOTE — Telephone Encounter (Signed)
PA has been sent to [facility].     KP

## 2015-06-23 NOTE — Progress Notes (Signed)
HPI She presents for followup of a nonischemic cardiomyopathy and atrial fibrillation.  Since I last saw her she has done well.  She did see Dr. Caryl Comes and he increased her Coreg in Jan because her BP was elevated.  She has done very well by her report.  She's had no cardiovascular symptoms. She's not having any new shortness of breath though she is on chronic oxygen. She's not had any palpitations, presyncope or syncope.   Allergies  Allergen Reactions  . Dabigatran Etexilate Mesylate Other (See Comments)    INTERNAL BLEEDING-pradaxa  . Talwin [Pentazocine] Other (See Comments)    hallucinations  . Pentazocine Lactate Other (See Comments)    HALLUCINATIONS  . Sulfonamide Derivatives Other (See Comments)    JITTERINESS     Current Outpatient Prescriptions  Medication Sig Dispense Refill  . albuterol (PROAIR HFA) 108 (90 BASE) MCG/ACT inhaler Inhale 2 puffs into the lungs every 6 (six) hours as needed for wheezing or shortness of breath. 1 Inhaler 3  . AMBULATORY NON FORMULARY MEDICATION Pt takes Oxygen, 2 units, every day    . calcium carbonate (OS-CAL - DOSED IN MG OF ELEMENTAL CALCIUM) 1250 (500 CA) MG tablet Take 2 tablets (1,000 mg of elemental calcium total) by mouth 3 (three) times daily with meals. 180 tablet 0  . carvedilol (COREG) 12.5 MG tablet Take 1 tablet (12.5 mg total) by mouth 2 (two) times daily. 180 tablet 3  . denosumab (PROLIA) 60 MG/ML SOLN injection Inject 60 mg into the skin once. Administer in upper arm, thigh, or abdomen (Patient taking differently: Inject 60 mg into the skin every 6 (six) months. Administer in upper arm, thigh, or abdomen) 1.8 mL   . folic acid (FOLVITE) 1 MG tablet Take 1 mg by mouth daily.    . furosemide (LASIX) 40 MG tablet TAKE ONE TABLET BY MOUTH IN THE MORNING 90 tablet 1  . gabapentin (NEURONTIN) 600 MG tablet TAKE ONE TABLET BY MOUTH 4 TIMES DAILY 120 tablet 0  . loperamide (IMODIUM A-D) 2 MG tablet Take 1/2 tablet by mouth daily as  needed for diarrhea. 30 tablet 2  . methotrexate 2.5 MG tablet Take 10 mg by mouth once a week.    [name] MYRBETRIQ 25 MG TB24 tablet Take 1 tablet by mouth daily.    . nitroGLYCERIN (NITROSTAT) 0.4 MG SL tablet Place 0.4 mg under the tongue every 5 (five) minutes as needed. CHEST PAIN    . oxyCODONE-acetaminophen (PERCOCET) 10-325 MG tablet Take 1 tablet by mouth every 4 (four) hours as needed. 120 tablet 0  . pantoprazole (PROTONIX) 40 MG tablet TAKE ONE TABLET BY MOUTH TWICE DAILY 60 tablet 5  . promethazine (PHENERGAN) 25 MG tablet TAKE ONE TABLET BY MOUTH EVERY 8 HOURS AS NEEDED FOR NAUSEA 40 tablet 1  . tiotropium (SPIRIVA HANDIHALER) 18 MCG inhalation capsule Place 1 capsule (18 mcg total) into inhaler and inhale every morning. 10 capsule 0  . tiZANidine (ZANAFLEX) 4 MG tablet TAKE ONE TABLET BY MOUTH THREE TIMES DAILY AS NEEDED FOR MUSCLE SPASM 90 tablet 0  . zolpidem (AMBIEN) 5 MG tablet Take 1 tablet (5 mg total) by mouth at bedtime as needed. 30 tablet 0  . [DISCONTINUED] iron dextran complex (INFED) 50 MG/ML injection Please give infed infusion (no test dose needed) over 4 hours per pharmacy calculated dose. Ht: 5'2 Wt: 99 pounds Hgb: 12 1 mL 0   No current facility-administered medications for this visit.    Past Medical History  Diagnosis Date  . GERD (gastroesophageal reflux disease)   . CAD (coronary artery disease)     mild disease per cath in 2011  . Venous embolism and thrombosis of subclavian vein ([place])     after pacemaker insertion Oct 2011  . Pleural effusion      s/p right thoracentesis 03/09  . Small bowel obstruction ([place])   . Urinary incontinence      -INTERSTIM IMPLANT NOT FUNCTIONING PER PT  . RLS (restless legs syndrome)   . Primary dilated cardiomyopathy ([facility])     EF 45 to 50% per echo in Jan 2012  . Vitamin B12 deficiency   . Gastroparesis   . Chronic nausea   . Chronic abdominal pain   . Chronic pain syndrome   . Pacemaker     CRT therapy; followed by  Dr. Caryl Comes  . H/O: GI bleed     from Pradaxa  . Oxygen dependent     2 liters via nasal cannula at all times  . CHF (congestive heart failure) ([place])   . Anemia   . Hx of stress fx aug 2011    right hip   . Elbow fracture, left aug 2012  . Arthritis     right hip  . Hx of gastric ulcer   . Diverticulitis   . HTN (hypertension)     under control; has been on med. x "years"  . Hx MRSA infection   . Pneumonia - 03/09, 12/09, 02/10, 06/10    MOST RECENT FEB 2013  . Elevated liver enzymes   . Blood transfusion   . Anginal pain ([place])   . Fibromyalgia     COUPLE OF TIMES A YEAR-PT HAS EPISODES OF CONFUSION-USUALLY INVOLVES DAY/NIGHT REVERSAL AND EPISODES OF TWITCHING AND FALLS--SEES DR. Jacelyn Grip - NEUROLOGIST-LAST SEEN 07/10/11  . LBBB (left bundle branch block)   . Atrial fibrillation ([place])   . COPD (chronic obstructive pulmonary disease) ([facility])     continuous O2- 2l  . Hepatitis     hx of in high school   . Silent aspiration     Past Surgical History  Procedure Laterality Date  . Appendectomy    . Pacemaker placement  12/21/2009  . Peg removed      with complications  . Tonsillectomy  age 4  . Interstim implant revision  03/06/2011    Procedure: REVISION OF [name];  Surgeon: Reece Packer, MD;  Location: [facility];  Service: Urology;  Laterality: N/A;  Replacement of Neurostimulator  . Portacath placement  12/16/2009  . Total abdominal hysterectomy      complete  . Interstim implant placement  05/28/2006 - stage I    06/05/2006 - stage II  . Interstim implant revision  10/23/2007  . Cystoscopy with injection  04/30/2006    transurethral collagen injection; incision vaginal stenosis  . Small intestine surgery  05/20/2001    ex. lap., resection of small bowel stricture; gastrostomy; insertion central line  . Cardiac catheterization  04/08/2006, 11/16/2009  . Cystoscopy  11/11/2008  . Cystoscopy w/ retrogrades  11/11/2008    right  . Colectomy      Intestinal resection (5times)  .  Colectomy  02/04/2000    ex. lap., intra-abd. subtotal colectomy with ileosigmoid colon anastomosies and lysis of adhesions  . Gastrocutaneous fistula closure    . Exploratory laparotomy  04/27/2009    lysis of adhesions, gastrostomy tube  . Port-a-cath removal  07/27/2011    Procedure: REMOVAL PORT-A-CATH;  Surgeon: Rodman Key  Donne Hazel, MD;  Location: [place] [facility];  Service: General;  Laterality: N/A;  . Total hip arthroplasty  08/03/2011    Procedure: TOTAL HIP ARTHROPLASTY ANTERIOR APPROACH;  Surgeon: Mcarthur Rossetti, MD;  Location: [facility];  Service: Orthopedics;  Laterality: Right;  . Interstim implant placement  02/05/2012    Procedure: INTERSTIM IMPLANT FIRST STAGE;  Surgeon: Reece Packer, MD;  Location: [facility];  Service: Urology;  Laterality: N/A;  Replacement of Interstim Lead     . Esophagogastroduodenoscopy N/A 02/02/2013    Procedure: ESOPHAGOGASTRODUODENOSCOPY (EGD);  Surgeon: [place] Dragon, MD;  Location: [facility];  Service: Endoscopy;  Laterality: N/A;  . Savory dilation N/A 02/02/2013    Procedure: SAVORY DILATION;  Surgeon: [place] Dragon, MD;  Location: [facility];  Service: Endoscopy;  Laterality: N/A;    ROS:   As stated in the HPI and negative for all other systems.  PHYSICAL EXAM BP 136/72 mmHg  Pulse 68  Ht 5' (1.524 m)  Wt 101 lb (45.813 kg)  BMI 19.73 kg/m2 GENERAL:  Frail appearing NECK:  No jugular venous distention, waveform within normal limits, carotid upstroke brisk and symmetric, no bruits, no thyromegaly LYMPHATICS:  No cervical, inguinal adenopathy LUNGS:  Clear to auscultation bilaterally BACK:  No CVA tenderness CHEST: CRT pocket intact HEART:  PMI not displaced or sustained,S1 and S2 within normal limits, no S3, no S4, no clicks, no rubs, no murmurs ABD:  Flat, positive bowel sounds normal in frequency in pitch, no bruits, no rebound, no guarding, no midline pulsatile mass, no hepatomegaly, no splenomegaly EXT:  2 plus  pulses throughout, no edema, no cyanosis no clubbing  EKG:  Sinus rhythm, rate 71, ventricular pacing 100% capture, QTC prolonged  06/24/2015   ASSESSMENT AND PLAN   ATRIAL FIBRILLATION -  She is not having symptomatic recurrences of this. She had some bleeding while on anticoagulation in the past. She has lung issues and so was taken off amiodarone in the past. At this point without recurrent palpitations no change in therapy is indicated.  CARDIOMYOPATHY, PRIMARY, DILATED -  Her EF is now reported to be 65%. It was 50% in 2012.  She is euvolemic.  No change in meds is indciated.   HYPERTENSION -  The blood pressure is at target. No change in medications is indicated. We will continue with therapeutic lifestyle changes (TLC).

## 2015-06-24 ENCOUNTER — Ambulatory Visit (INDEPENDENT_AMBULATORY_CARE_PROVIDER_SITE_OTHER): Payer: PPO | Admitting: Cardiology

## 2015-06-24 ENCOUNTER — Encounter: Payer: Self-pay | Admitting: Cardiology

## 2015-06-24 VITALS — BP 136/72 | HR 68 | Ht 60.0 in | Wt 101.0 lb

## 2015-06-24 DIAGNOSIS — I480 Paroxysmal atrial fibrillation: Secondary | ICD-10-CM | POA: Diagnosis not present

## 2015-06-24 NOTE — Patient Instructions (Signed)
Your physician wants you to follow-up in: 1 Year. You will receive a reminder letter in the mail two months in advance. If you don't receive a letter, please call our office to schedule the follow-up appointment.  

## 2015-07-05 ENCOUNTER — Other Ambulatory Visit: Payer: Self-pay | Admitting: Family Medicine

## 2015-07-07 DIAGNOSIS — Z0000 Encounter for general adult medical examination without abnormal findings: Secondary | ICD-10-CM | POA: Diagnosis not present

## 2015-07-07 DIAGNOSIS — N3941 Urge incontinence: Secondary | ICD-10-CM | POA: Diagnosis not present

## 2015-07-07 DIAGNOSIS — R350 Frequency of micturition: Secondary | ICD-10-CM | POA: Diagnosis not present

## 2015-07-15 ENCOUNTER — Telehealth: Payer: Self-pay | Admitting: Family Medicine

## 2015-07-15 DIAGNOSIS — R52 Pain, unspecified: Secondary | ICD-10-CM

## 2015-07-15 NOTE — Telephone Encounter (Signed)
Medication refill for oxyCODONE.    CB: [phone]

## 2015-07-15 NOTE — Telephone Encounter (Signed)
Last rx 06/08/15 and due for UDS. Last OV: 03/04/15 Next OV:  09/06/15  Please advise Rx?

## 2015-07-17 NOTE — Telephone Encounter (Signed)
OK to refill Oxycodone same number as last rx

## 2015-07-18 MED ORDER — OXYCODONE-ACETAMINOPHEN 10-325 MG PO TABS: 1.0000 | ORAL_TABLET | ORAL | Status: DC | PRN

## 2015-07-18 NOTE — Telephone Encounter (Signed)
Water quality scientist and on counter for [employer]

## 2015-07-18 NOTE — Telephone Encounter (Signed)
Patient informed to pickup hardcopy at the front desk. 

## 2015-07-19 ENCOUNTER — Other Ambulatory Visit: Payer: Self-pay | Admitting: Family Medicine

## 2015-07-19 NOTE — Telephone Encounter (Signed)
Last seen 03/14/15 and filled 06/16/15 #30  Please advise    KP

## 2015-07-21 ENCOUNTER — Telehealth: Payer: Self-pay | Admitting: Family Medicine

## 2015-07-21 NOTE — Telephone Encounter (Signed)
PA for Prolia has been approved from 06/28/15 and 06/26/16 for 2 units. Confirmation number [id number].   KP

## 2015-07-21 NOTE — Telephone Encounter (Signed)
FYI-  Patient scheduled BMP for 07/25/15 and prolia injection with nurse 07/28/15

## 2015-07-25 ENCOUNTER — Other Ambulatory Visit (INDEPENDENT_AMBULATORY_CARE_PROVIDER_SITE_OTHER): Payer: PPO

## 2015-07-25 DIAGNOSIS — I10 Essential (primary) hypertension: Secondary | ICD-10-CM | POA: Diagnosis not present

## 2015-07-25 LAB — BASIC METABOLIC PANEL
BUN: 18 mg/dL (ref 6–23)
CO2: 32 mEq/L (ref 19–32)
Calcium: 9.6 mg/dL (ref 8.4–10.5)
Chloride: 98 mEq/L (ref 96–112)
Creatinine, Ser: 0.87 mg/dL (ref 0.40–1.20)
GFR: 68.28 mL/min (ref >60.00)
Glucose, Bld: 92 mg/dL (ref 70–99)
Potassium: 4.8 mEq/L (ref 3.5–5.1)
Sodium: 136 mEq/L (ref 135–145)

## 2015-07-28 ENCOUNTER — Ambulatory Visit (INDEPENDENT_AMBULATORY_CARE_PROVIDER_SITE_OTHER): Payer: PPO | Admitting: *Deleted

## 2015-07-28 DIAGNOSIS — M810 Age-related osteoporosis without current pathological fracture: Secondary | ICD-10-CM | POA: Diagnosis not present

## 2015-07-28 MED ORDER — DENOSUMAB 60 MG/ML ~~LOC~~ SOLN
60.0000 mg | Freq: Once | SUBCUTANEOUS | Status: AC
  Administered 2015-07-28: 60 mg via SUBCUTANEOUS

## 2015-07-28 NOTE — Progress Notes (Signed)
Pre visit review using our clinic review tool, if applicable. No additional management support is needed unless otherwise documented below in the visit note.  Pt tolerated injection well.   Arwa Yero J Ranae Casebier, RN  

## 2015-08-04 ENCOUNTER — Other Ambulatory Visit: Payer: Self-pay | Admitting: Physician Assistant

## 2015-08-04 NOTE — Telephone Encounter (Signed)
Will defer further refills of patient's medications to PCP  

## 2015-08-08 ENCOUNTER — Other Ambulatory Visit: Payer: Self-pay | Admitting: Family Medicine

## 2015-08-10 NOTE — Telephone Encounter (Signed)
To PCP for review.  Last OV: 03/04/15 Last filled: 06/16/15, #90. 0 RF Sig: TAKE ONE TABLET BY MOUTH THREE TIMES DAILY AS NEEDED FOR MUSCLE SPASM UDS: 08/09/14, positive for percocet, ambien, and gabapentin; low risk

## 2015-08-10 NOTE — Telephone Encounter (Signed)
Refill sent.

## 2015-08-10 NOTE — Telephone Encounter (Signed)
Pt called to f/u on request for tizanidine. She is now out of medication. Please send in to pharmacy.  [facility] [place]

## 2015-08-16 ENCOUNTER — Other Ambulatory Visit: Payer: Self-pay | Admitting: Family Medicine

## 2015-08-16 NOTE — Telephone Encounter (Signed)
Last seen 03/04/15 and filled 07/19/15 #30   Please advise    KP

## 2015-08-17 ENCOUNTER — Other Ambulatory Visit: Payer: Self-pay | Admitting: Family Medicine

## 2015-08-17 DIAGNOSIS — R52 Pain, unspecified: Secondary | ICD-10-CM

## 2015-08-17 MED ORDER — OXYCODONE-ACETAMINOPHEN 10-325 MG PO TABS: 1.0000 | ORAL_TABLET | ORAL | Status: DC | PRN

## 2015-08-17 NOTE — Telephone Encounter (Signed)
Refill x1 

## 2015-08-17 NOTE — Telephone Encounter (Signed)
Last seen 03/04/15 and filled 07/18/15 #120  UDS 08/09/14 low risk   Please advise     KP

## 2015-08-17 NOTE — Telephone Encounter (Signed)
Rx printed and placed for Dr. Etter Sjogren to sign.//AB/CMA

## 2015-08-17 NOTE — Telephone Encounter (Signed)
Rx denied-Rx filled on (08/16/15) and faxed.//AB/CMA

## 2015-08-17 NOTE — Telephone Encounter (Signed)
Can be reached: [phone]   Reason for call: Pt request refill on oxycodone. She has 5-6 days. Please call when RX ready for pick up.

## 2015-08-18 NOTE — Telephone Encounter (Signed)
Patient aware Rx is ready for pick up.      KP

## 2015-08-31 DIAGNOSIS — L400 Psoriasis vulgaris: Secondary | ICD-10-CM | POA: Diagnosis not present

## 2015-08-31 DIAGNOSIS — Z79899 Other long term (current) drug therapy: Secondary | ICD-10-CM | POA: Diagnosis not present

## 2015-09-05 ENCOUNTER — Other Ambulatory Visit: Payer: Self-pay | Admitting: Cardiology

## 2015-09-05 NOTE — Telephone Encounter (Signed)
Rx(s) sent to pharmacy electronically.  

## 2015-09-06 ENCOUNTER — Ambulatory Visit (INDEPENDENT_AMBULATORY_CARE_PROVIDER_SITE_OTHER): Payer: PPO | Admitting: Family Medicine

## 2015-09-06 ENCOUNTER — Encounter: Payer: Self-pay | Admitting: Family Medicine

## 2015-09-06 VITALS — BP 114/68 | HR 73 | Temp 98.0°F | Ht 60.5 in | Wt 103.0 lb

## 2015-09-06 DIAGNOSIS — I10 Essential (primary) hypertension: Secondary | ICD-10-CM | POA: Diagnosis not present

## 2015-09-06 DIAGNOSIS — E559 Vitamin D deficiency, unspecified: Secondary | ICD-10-CM | POA: Diagnosis not present

## 2015-09-06 DIAGNOSIS — E785 Hyperlipidemia, unspecified: Secondary | ICD-10-CM

## 2015-09-06 DIAGNOSIS — G4700 Insomnia, unspecified: Secondary | ICD-10-CM

## 2015-09-06 DIAGNOSIS — N390 Urinary tract infection, site not specified: Secondary | ICD-10-CM | POA: Diagnosis not present

## 2015-09-06 DIAGNOSIS — R52 Pain, unspecified: Secondary | ICD-10-CM | POA: Diagnosis not present

## 2015-09-06 DIAGNOSIS — Z0000 Encounter for general adult medical examination without abnormal findings: Secondary | ICD-10-CM | POA: Diagnosis not present

## 2015-09-06 DIAGNOSIS — R8290 Unspecified abnormal findings in urine: Secondary | ICD-10-CM | POA: Diagnosis not present

## 2015-09-06 DIAGNOSIS — R82998 Other abnormal findings in urine: Secondary | ICD-10-CM

## 2015-09-06 LAB — POCT URINALYSIS DIPSTICK
Bilirubin, UA: NEGATIVE
Blood, UA: NEGATIVE
Glucose, UA: NEGATIVE
Ketones, UA: NEGATIVE
Nitrite, UA: POSITIVE
Protein, UA: NEGATIVE
Spec Grav, UA: 1.02
Urobilinogen, UA: 0.2
pH, UA: 6

## 2015-09-06 LAB — LIPID PANEL
Cholesterol: 175 mg/dL (ref 0–200)
HDL: 38.4 mg/dL — ABNORMAL LOW (ref >39.00)
LDL Cholesterol: 112 mg/dL — ABNORMAL HIGH (ref 0–99)
NonHDL: 136.61
Total CHOL/HDL Ratio: 5
Triglycerides: 125 mg/dL (ref 0.0–149.0)
VLDL: 25 mg/dL (ref 0.0–40.0)

## 2015-09-06 LAB — COMPREHENSIVE METABOLIC PANEL
ALT: 11 U/L (ref 0–35)
AST: 16 U/L (ref 0–37)
Albumin: 4.1 g/dL (ref 3.5–5.2)
Alkaline Phosphatase: 83 U/L (ref 39–117)
BUN: 21 mg/dL (ref 6–23)
CO2: 32 mEq/L (ref 19–32)
Calcium: 8 mg/dL — ABNORMAL LOW (ref 8.4–10.5)
Chloride: 102 mEq/L (ref 96–112)
Creatinine, Ser: 0.91 mg/dL (ref 0.40–1.20)
GFR: 64.8 mL/min (ref >60.00)
Glucose, Bld: 93 mg/dL (ref 70–99)
Potassium: 5.1 mEq/L (ref 3.5–5.1)
Sodium: 139 mEq/L (ref 135–145)
Total Bilirubin: 0.3 mg/dL (ref 0.2–1.2)
Total Protein: 7.4 g/dL (ref 6.0–8.3)

## 2015-09-06 LAB — CBC WITH DIFFERENTIAL/PLATELET
Basophils Absolute: 0.1 10*3/uL (ref 0.0–0.1)
Basophils Relative: 0.9 % (ref 0.0–3.0)
Eosinophils Absolute: 0.3 10*3/uL (ref 0.0–0.7)
Eosinophils Relative: 3.2 % (ref 0.0–5.0)
HCT: 33.1 % — ABNORMAL LOW (ref 36.0–46.0)
Hemoglobin: 10.8 g/dL — ABNORMAL LOW (ref 12.0–15.0)
Lymphocytes Relative: 6.9 % — ABNORMAL LOW (ref 12.0–46.0)
Lymphs Abs: 0.6 10*3/uL — ABNORMAL LOW (ref 0.7–4.0)
MCHC: 32.7 g/dL (ref 30.0–36.0)
MCV: 86.2 fl (ref 78.0–100.0)
Monocytes Absolute: 0.7 10*3/uL (ref 0.1–1.0)
Monocytes Relative: 7.7 % (ref 3.0–12.0)
Neutro Abs: 7 10*3/uL (ref 1.4–7.7)
Neutrophils Relative %: 81.3 % — ABNORMAL HIGH (ref 43.0–77.0)
Platelets: 279 10*3/uL (ref 150.0–400.0)
RBC: 3.84 Mil/uL — ABNORMAL LOW (ref 3.87–5.11)
RDW: 14.9 % (ref 11.5–15.5)
WBC: 8.6 10*3/uL (ref 4.0–10.5)

## 2015-09-06 LAB — VITAMIN D 25 HYDROXY (VIT D DEFICIENCY, FRACTURES): VITD: 16.46 ng/mL — ABNORMAL LOW (ref 30.00–100.00)

## 2015-09-06 MED ORDER — OXYCODONE-ACETAMINOPHEN 10-325 MG PO TABS: 1.0000 | ORAL_TABLET | ORAL | Status: DC | PRN

## 2015-09-06 MED ORDER — ZOLPIDEM TARTRATE 5 MG PO TABS: 5.0000 mg | ORAL_TABLET | Freq: Every evening | ORAL | Status: DC | PRN

## 2015-09-06 MED ORDER — TIZANIDINE HCL 4 MG PO TABS: 4.0000 mg | ORAL_TABLET | Freq: Three times a day (TID) | ORAL | Status: DC | PRN

## 2015-09-06 MED ORDER — GABAPENTIN 600 MG PO TABS: 600.0000 mg | ORAL_TABLET | Freq: Four times a day (QID) | ORAL | Status: DC

## 2015-09-06 NOTE — Progress Notes (Signed)
Pre visit review using our clinic review tool, if applicable. No additional management support is needed unless otherwise documented below in the visit note. 

## 2015-09-06 NOTE — Progress Notes (Signed)
Subjective:   [name] is a 70 y.o. female who presents for Medicare Annual (Subsequent) preventive examination.  Review of Systems:   Review of Systems  Constitutional: Negative for activity change, appetite change and fatigue.  HENT: Negative for hearing loss, congestion, tinnitus and ear discharge.   Eyes: Negative for visual disturbance (see optho q1y -- vision corrected to 20/20 with glasses).  Respiratory: Negative for cough, chest tightness and shortness of breath.   Cardiovascular: Negative for chest pain, palpitations and leg swelling.  Gastrointestinal: Negative for abdominal pain, diarrhea, constipation and abdominal distention.  Genitourinary: Negative for urgency, frequency, decreased urine volume and difficulty urinating.  Musculoskeletal: Negative for back pain, arthralgias and gait problem.  Skin: Negative for color change, pallor and rash.  Neurological: Negative for dizziness, light-headedness, numbness and headaches.  Hematological: Negative for adenopathy. Does not bruise/bleed easily.  Psychiatric/Behavioral: Negative for suicidal ideas, confusion, sleep disturbance, self-injury, dysphoric mood, decreased concentration and agitation.  Pt is able to read and write and can do all ADLs No risk for falling No abuse/ violence in home           Objective:     Vitals: BP 114/68 mmHg  Pulse 73  Temp(Src) 98 F (36.7 C) (Oral)  Ht 5' 0.5" (1.537 m)  Wt 103 lb (46.72 kg)  BMI 19.78 kg/m2  SpO2 98%  Body mass index is 19.78 kg/(m^2). BP 114/68 mmHg  Pulse 73  Temp(Src) 98 F (36.7 C) (Oral)  Ht 5' 0.5" (1.537 m)  Wt 103 lb (46.72 kg)  BMI 19.78 kg/m2  SpO2 98% General appearance: alert, cooperative, appears stated age and no distress Head: Normocephalic, without obvious abnormality, atraumatic Eyes: negative findings: lids and lashes normal, conjunctivae and sclerae normal and pupils equal, round, reactive to light and accomodation Ears: normal  TM's and external ear canals both ears Nose: Nares normal. Septum midline. Mucosa normal. No drainage or sinus tenderness. Throat: lips, mucosa, and tongue normal; teeth and gums normal Neck: no adenopathy, no carotid bruit, no JVD, supple, symmetrical, trachea midline and thyroid not enlarged, symmetric, no tenderness/mass/nodules Back: symmetric, no curvature. ROM normal. No CVA tenderness.---some kyphosis Lungs: clear to auscultation bilaterally--- on O2 Breasts: normal appearance, no masses or tenderness Heart: S1, S2 normal Abdomen: soft, non-tender; bowel sounds normal; no masses,  no organomegaly Pelvic: not indicated; post-menopausal, no abnormal Pap smears in past Extremities: extremities normal, atraumatic, no cyanosis or edema Pulses: 2+ and symmetric Skin: Skin color, texture, turgor normal. No rashes or lesions Lymph nodes: Cervical, supraclavicular, and axillary nodes normal. Neurologic: Alert and oriented X 3, normal strength and tone. Normal symmetric reflexes. Normal coordination and gait  Tobacco History  Smoking status  . Former Smoker -- 1.00 packs/day for 20 years  . Quit date: 03/19/1986  Smokeless tobacco  . Never Used     Counseling given: Not Answered   Past Medical History  Diagnosis Date  . GERD (gastroesophageal reflux disease)   . CAD (coronary artery disease)     mild disease per cath in 2011  . Venous embolism and thrombosis of subclavian vein ([place])     after pacemaker insertion Oct 2011  . Pleural effusion      s/p right thoracentesis 03/09  . Small bowel obstruction ([place])   . Urinary incontinence      -INTERSTIM IMPLANT NOT FUNCTIONING PER PT  . RLS (restless legs syndrome)   . Primary dilated cardiomyopathy (H[facility])     EF 45 to 50%  per echo in Jan 2012  . Vitamin B12 deficiency   . Gastroparesis   . Chronic nausea   . Chronic abdominal pain   . Chronic pain syndrome   . Pacemaker     CRT therapy; followed by Dr. Caryl Comes  . H/O: GI bleed       from Pradaxa  . Oxygen dependent     2 liters via nasal cannula at all times  . CHF (congestive heart failure) ([place])   . Anemia   . Hx of stress fx aug 2011    right hip   . Elbow fracture, left aug 2012  . Arthritis     right hip  . Hx of gastric ulcer   . Diverticulitis   . HTN (hypertension)     under control; has been on med. x "years"  . Hx MRSA infection   . Pneumonia - 03/09, 12/09, 02/10, 06/10    MOST RECENT FEB 2013  . Elevated liver enzymes   . Blood transfusion   . Anginal pain ([place])   . Fibromyalgia     COUPLE OF TIMES A YEAR-PT HAS EPISODES OF CONFUSION-USUALLY INVOLVES DAY/NIGHT REVERSAL AND EPISODES OF TWITCHING AND FALLS--SEES DR. Jacelyn Grip - NEUROLOGIST-LAST SEEN 07/10/11  . LBBB (left bundle branch block)   . Atrial fibrillation ([place])   . COPD (chronic obstructive pulmonary disease) (H[facility])     continuous O2- 2l  . Hepatitis     hx of in high school   . Silent aspiration    Past Surgical History  Procedure Laterality Date  . Appendectomy    . Pacemaker placement  12/21/2009  . Peg removed      with complications  . Tonsillectomy  age 21  . Interstim implant revision  03/06/2011    Procedure: REVISION OF [name];  Surgeon: Reece Packer, MD;  Location: [facility];  Service: Urology;  Laterality: N/A;  Replacement of Neurostimulator  . Portacath placement  12/16/2009  . Total abdominal hysterectomy      complete  . Interstim implant placement  05/28/2006 - stage I    06/05/2006 - stage II  . Interstim implant revision  10/23/2007  . Cystoscopy with injection  04/30/2006    transurethral collagen injection; incision vaginal stenosis  . Small intestine surgery  05/20/2001    ex. lap., resection of small bowel stricture; gastrostomy; insertion central line  . Cardiac catheterization  04/08/2006, 11/16/2009  . Cystoscopy  11/11/2008  . Cystoscopy w/ retrogrades  11/11/2008    right  . Colectomy      Intestinal resection (5times)  . Colectomy  02/04/2000    ex.  lap., intra-abd. subtotal colectomy with ileosigmoid colon anastomosies and lysis of adhesions  . Gastrocutaneous fistula closure    . Exploratory laparotomy  04/27/2009    lysis of adhesions, gastrostomy tube  . Port-a-cath removal  07/27/2011    Procedure: REMOVAL PORT-A-CATH;  Surgeon: Rolm Bookbinder, MD;  Location: [facility];  Service: General;  Laterality: N/A;  . Total hip arthroplasty  08/03/2011    Procedure: TOTAL HIP ARTHROPLASTY ANTERIOR APPROACH;  Surgeon: Mcarthur Rossetti, MD;  Location: [facility];  Service: Orthopedics;  Laterality: Right;  . Interstim implant placement  02/05/2012    Procedure: INTERSTIM IMPLANT FIRST STAGE;  Surgeon: Reece Packer, MD;  Location: [facility];  Service: Urology;  Laterality: N/A;  Replacement of Interstim Lead     . Esophagogastroduodenoscopy N/A 02/02/2013    Procedure: ESOPHAGOGASTRODUODENOSCOPY (EGD);  Surgeon: Lowella Bandy  Olevia Perches, MD;  Location: [facility];  Service: Endoscopy;  Laterality: N/A;  . Savory dilation N/A 02/02/2013    Procedure: SAVORY DILATION;  Surgeon: Lafayette Dragon, MD;  Location: [facility];  Service: Endoscopy;  Laterality: N/A;   Family History  Problem Relation Age of Onset  . Heart disease Father   . Stroke Mother   . Heart disease Mother   . Colon cancer Neg Hx   . Heart disease Brother   . Breast cancer Maternal Aunt   . Heart disease Paternal Aunt   . Diabetes Paternal Grandfather    History  Sexual Activity  . Sexual Activity: Yes    Outpatient Encounter Prescriptions as of 09/06/2015  Medication Sig  . albuterol (PROAIR HFA) 108 (90 BASE) MCG/ACT inhaler Inhale 2 puffs into the lungs every 6 (six) hours as needed for wheezing or shortness of breath.  . AMBULATORY NON FORMULARY MEDICATION Pt takes Oxygen, 2 units, every day  . calcium carbonate (OS-CAL - DOSED IN MG OF ELEMENTAL CALCIUM) 1250 (500 CA) MG tablet Take 2 tablets (1,000 mg of elemental calcium total) by mouth 3 (three)  times daily with meals.  . carvedilol (COREG) 12.5 MG tablet Take 1 tablet (12.5 mg total) by mouth 2 (two) times daily.  [name] denosumab (PROLIA) 60 MG/ML SOLN injection Inject 60 mg into the skin once. Administer in upper arm, thigh, or abdomen (Patient taking differently: Inject 60 mg into the skin every 6 (six) months. Administer in upper arm, thigh, or abdomen)  . folic acid (FOLVITE) 1 MG tablet Take 1 mg by mouth daily.  . furosemide (LASIX) 40 MG tablet TAKE ONE TABLET BY MOUTH IN THE MORNING  . gabapentin (NEURONTIN) 600 MG tablet TAKE ONE TABLET BY MOUTH 4 TIMES DAILY  . loperamide (IMODIUM A-D) 2 MG tablet Take 1/2 tablet by mouth daily as needed for diarrhea.  . methotrexate 2.5 MG tablet Take 10 mg by mouth once a week.  [name] MYRBETRIQ 25 MG TB24 tablet Take 1 tablet by mouth daily.  . nitroGLYCERIN (NITROSTAT) 0.4 MG SL tablet Place 0.4 mg under the tongue every 5 (five) minutes as needed. CHEST PAIN  . oxyCODONE-acetaminophen (PERCOCET) 10-325 MG tablet Take 1 tablet by mouth every 4 (four) hours as needed.  . pantoprazole (PROTONIX) 40 MG tablet TAKE ONE TABLET BY MOUTH TWICE DAILY  . promethazine (PHENERGAN) 25 MG tablet TAKE ONE TABLET BY MOUTH EVERY 8 HOURS AS NEEDED FOR NAUSEA  . tiotropium (SPIRIVA HANDIHALER) 18 MCG inhalation capsule Place 1 capsule (18 mcg total) into inhaler and inhale every morning.  [name] tiZANidine (ZANAFLEX) 4 MG tablet TAKE ONE TABLET BY MOUTH THREE TIMES DAILY AS NEEDED FOR MUSCLE SPASM  . zolpidem (AMBIEN) 5 MG tablet TAKE ONE TABLET BY MOUTH AT BEDTIME AS NEEDED   No facility-administered encounter medications on file as of 09/06/2015.    Activities of Daily Living In your present state of health, do you have any difficulty performing the following activities: 09/06/2015 12/28/2014  Hearing? N Y  Vision? N N  Difficulty concentrating or making decisions? N N  Walking or climbing stairs? N Y  Dressing or bathing? N N  Doing errands, shopping? N N    Preparing Food and eating ? - N  Using the Toilet? - N  In the past six months, have you accidently leaked urine? - Y  Do you have problems with loss of bowel control? - N  Managing your Medications? - N  Managing your Finances? -  N  Housekeeping or managing your Housekeeping? - N    Patient Care Team: Ann Held, DO as PCP - General Rolm Bookbinder, MD as Consulting Physician (General Surgery) Lafayette Dragon, MD as Consulting Physician (Gastroenterology) Gatha Mayer, MD as Consulting Physician (Gastroenterology) Rutherford Guys, MD as Consulting Physician (Ophthalmology) Crista Luria, MD as Consulting Physician (Dermatology) Deboraha Sprang, MD as Consulting Physician (Cardiology) Minus Breeding, MD as Consulting Physician (Cardiology)    Assessment:    CPE Exercise Activities and Dietary recommendations Current Exercise Habits: Home exercise routine, Type of exercise: walking, Time (Minutes): 30, Frequency (Times/Week): 5, Weekly Exercise (Minutes/Week): 150, Intensity: Moderate, Exercise limited by: respiratory conditions(s)  Goals    . Increase physical activity     Thinking of getting a treadmill.      . Stay active      Taking care of her family.  Cleaning home.         Fall Risk Fall Risk  09/06/2015 03/04/2015 12/28/2014 11/15/2014 07/20/2013  Falls in the past year? No No Yes Yes No  Number falls in past yr: - - 2 or more 2 or more -  Injury with Fall? - - Yes Yes -  Risk for fall due to : - - Impaired balance/gait - -  Risk for fall due to (comments): - - Pt states she moves too fast for age sometimes - -  Follow up - - Education provided;Falls prevention discussed - -   Depression Screen PHQ 2/9 Scores 09/06/2015 03/04/2015 12/28/2014 11/15/2014  PHQ - 2 Score 0 0 0 0     Cognitive Testing MMSE - Mini Mental State Exam 12/28/2014  Orientation to time 5  Orientation to Place 5  Registration 3  Attention/ Calculation 5  Recall 3  Language- name  2 objects 2  Language- repeat 1  Language- follow 3 step command 3  Language- read & follow direction 1  Write a sentence 1  Copy design 1  Total score 30    Immunization History  Administered Date(s) Administered  . Influenza Split 01/16/2011, 12/12/2011  . Influenza Whole 01/15/2007, 12/08/2007, 12/28/2008, 12/17/2009  . Influenza, Seasonal, Injecte, Preservative Fre 12/09/2012  . Influenza,inj,Quad PF,36+ Mos 12/01/2013, 12/28/2014  . Pneumococcal Conjugate-13 06/10/2014, 11/15/2014  . Pneumococcal Polysaccharide-23 12/17/2009  . Tdap 05/15/2014  . Zoster 12/31/2013   Screening Tests Health Maintenance  Topic Date Due  . Hepatitis C Screening  Feb 19, 1946  . COLONOSCOPY  12/12/2008  . INFLUENZA VACCINE  10/18/2015  . MAMMOGRAM  09/13/2016  . TETANUS/TDAP  05/15/2024  . DEXA SCAN  Completed  . ZOSTAVAX  Completed  . PNA vac Low Risk Adult  Completed      Plan:   see AVS Check labs During the course of the visit the patient was educated and counseled about the following appropriate screening and preventive services:   Vaccines to include Pneumoccal, Influenza, Hepatitis B, Td, Zostavax, HCV  Electrocardiogram  Cardiovascular Disease  Colorectal cancer screening  Bone density screening  Diabetes screening  Glaucoma screening  Mammography/PAP  Nutrition counseling   Patient Instructions (the written plan) was given to the patient.  1. Essential hypertension stable - Comprehensive metabolic panel - CBC with Differential/Platelet - Lipid panel - POCT urinalysis dipstick  2. Hyperlipidemia Check labs  3. Vitamin D deficiency   - Vitamin D (25 hydroxy)  Ann Held, DO  09/06/2015

## 2015-09-06 NOTE — Patient Instructions (Signed)
Preventive Care for Adults, Female A healthy lifestyle and preventive care can promote health and wellness. Preventive health guidelines for women include the following key practices.  A routine yearly physical is a good way to check with your health care provider about your health and preventive screening. It is a chance to share any concerns and updates on your health and to receive a thorough exam.  Visit your dentist for a routine exam and preventive care every 6 months. Brush your teeth twice a day and floss once a day. Good oral hygiene prevents tooth decay and gum disease.  The frequency of eye exams is based on your age, health, family medical history, use of contact lenses, and other factors. Follow your health care provider's recommendations for frequency of eye exams.  Eat a healthy diet. Foods like vegetables, fruits, whole grains, low-fat dairy products, and lean protein foods contain the nutrients you need without too many calories. Decrease your intake of foods high in solid fats, added sugars, and salt. Eat the right amount of calories for you.Get information about a proper diet from your health care provider, if necessary.  Regular physical exercise is one of the most important things you can do for your health. Most adults should get at least 150 minutes of moderate-intensity exercise (any activity that increases your heart rate and causes you to sweat) each week. In addition, most adults need muscle-strengthening exercises on 2 or more days a week.  Maintain a healthy weight. The body mass index (BMI) is a screening tool to identify possible weight problems. It provides an estimate of body fat based on height and weight. Your health care provider can find your BMI and can help you achieve or maintain a healthy weight.For adults 20 years and older:  A BMI below 18.5 is considered underweight.  A BMI of 18.5 to 24.9 is normal.  A BMI of 25 to 29.9 is considered overweight.  A  BMI of 30 and above is considered obese.  Maintain normal blood lipids and cholesterol levels by exercising and minimizing your intake of saturated fat. Eat a balanced diet with plenty of fruit and vegetables. Blood tests for lipids and cholesterol should begin at age 45 and be repeated every 5 years. If your lipid or cholesterol levels are high, you are over 50, or you are at high risk for heart disease, you may need your cholesterol levels checked more frequently.Ongoing high lipid and cholesterol levels should be treated with medicines if diet and exercise are not working.  If you smoke, find out from your health care provider how to quit. If you do not use tobacco, do not start.  Lung cancer screening is recommended for adults aged 45-80 years who are at high risk for developing lung cancer because of a history of smoking. A yearly low-dose CT scan of the lungs is recommended for people who have at least a 30-pack-year history of smoking and are a current smoker or have quit within the past 15 years. A pack year of smoking is smoking an average of 1 pack of cigarettes a day for 1 year (for example: 1 pack a day for 30 years or 2 packs a day for 15 years). Yearly screening should continue until the smoker has stopped smoking for at least 15 years. Yearly screening should be stopped for people who develop a health problem that would prevent them from having lung cancer treatment.  If you are pregnant, do not drink alcohol. If you are  breastfeeding, be very cautious about drinking alcohol. If you are not pregnant and choose to drink alcohol, do not have more than 1 drink per day. One drink is considered to be 12 ounces (355 mL) of beer, 5 ounces (148 mL) of wine, or 1.5 ounces (44 mL) of liquor.  Avoid use of street drugs. Do not share needles with anyone. Ask for help if you need support or instructions about stopping the use of drugs.  High blood pressure causes heart disease and increases the risk  of stroke. Your blood pressure should be checked at least every 1 to 2 years. Ongoing high blood pressure should be treated with medicines if weight loss and exercise do not work.  If you are 55-79 years old, ask your health care provider if you should take aspirin to prevent strokes.  Diabetes screening is done by taking a blood sample to check your blood glucose level after you have not eaten for a certain period of time (fasting). If you are not overweight and you do not have risk factors for diabetes, you should be screened once every 3 years starting at age 45. If you are overweight or obese and you are 40-70 years of age, you should be screened for diabetes every year as part of your cardiovascular risk assessment.  Breast cancer screening is essential preventive care for women. You should practice "breast self-awareness." This means understanding the normal appearance and feel of your breasts and may include breast self-examination. Any changes detected, no matter how small, should be reported to a health care provider. Women in their 20s and 30s should have a clinical breast exam (CBE) by a health care provider as part of a regular health exam every 1 to 3 years. After age 40, women should have a CBE every year. Starting at age 40, women should consider having a mammogram (breast X-ray test) every year. Women who have a family history of breast cancer should talk to their health care provider about genetic screening. Women at a high risk of breast cancer should talk to their health care providers about having an MRI and a mammogram every year.  Breast cancer gene (BRCA)-related cancer risk assessment is recommended for women who have family members with BRCA-related cancers. BRCA-related cancers include breast, ovarian, tubal, and peritoneal cancers. Having family members with these cancers may be associated with an increased risk for harmful changes (mutations) in the breast cancer genes BRCA1 and  BRCA2. Results of the assessment will determine the need for genetic counseling and BRCA1 and BRCA2 testing.  Your health care provider may recommend that you be screened regularly for cancer of the pelvic organs (ovaries, uterus, and vagina). This screening involves a pelvic examination, including checking for microscopic changes to the surface of your cervix (Pap test). You may be encouraged to have this screening done every 3 years, beginning at age 21.  For women ages 30-65, health care providers may recommend pelvic exams and Pap testing every 3 years, or they may recommend the Pap and pelvic exam, combined with testing for human papilloma virus (HPV), every 5 years. Some types of HPV increase your risk of cervical cancer. Testing for HPV may also be done on women of any age with unclear Pap test results.  Other health care providers may not recommend any screening for nonpregnant women who are considered low risk for pelvic cancer and who do not have symptoms. Ask your health care provider if a screening pelvic exam is right for   you.  If you have had past treatment for cervical cancer or a condition that could lead to cancer, you need Pap tests and screening for cancer for at least 20 years after your treatment. If Pap tests have been discontinued, your risk factors (such as having a new sexual partner) need to be reassessed to determine if screening should resume. Some women have medical problems that increase the chance of getting cervical cancer. In these cases, your health care provider may recommend more frequent screening and Pap tests.  Colorectal cancer can be detected and often prevented. Most routine colorectal cancer screening begins at the age of 50 years and continues through age 75 years. However, your health care provider may recommend screening at an earlier age if you have risk factors for colon cancer. On a yearly basis, your health care provider may provide home test kits to check  for hidden blood in the stool. Use of a small camera at the end of a tube, to directly examine the colon (sigmoidoscopy or colonoscopy), can detect the earliest forms of colorectal cancer. Talk to your health care provider about this at age 50, when routine screening begins. Direct exam of the colon should be repeated every 5-10 years through age 75 years, unless early forms of precancerous polyps or small growths are found.  People who are at an increased risk for hepatitis B should be screened for this virus. You are considered at high risk for hepatitis B if:  You were born in a country where hepatitis B occurs often. Talk with your health care provider about which countries are considered high risk.  Your parents were born in a high-risk country and you have not received a shot to protect against hepatitis B (hepatitis B vaccine).  You have HIV or AIDS.  You use needles to inject street drugs.  You live with, or have sex with, someone who has hepatitis B.  You get hemodialysis treatment.  You take certain medicines for conditions like cancer, organ transplantation, and autoimmune conditions.  Hepatitis C blood testing is recommended for all people born from 1945 through 1965 and any individual with known risks for hepatitis C.  Practice safe sex. Use condoms and avoid high-risk sexual practices to reduce the spread of sexually transmitted infections (STIs). STIs include gonorrhea, chlamydia, syphilis, trichomonas, herpes, HPV, and human immunodeficiency virus (HIV). Herpes, HIV, and HPV are viral illnesses that have no cure. They can result in disability, cancer, and death.  You should be screened for sexually transmitted illnesses (STIs) including gonorrhea and chlamydia if:  You are sexually active and are younger than 24 years.  You are older than 24 years and your health care provider tells you that you are at risk for this type of infection.  Your sexual activity has changed  since you were last screened and you are at an increased risk for chlamydia or gonorrhea. Ask your health care provider if you are at risk.  If you are at risk of being infected with HIV, it is recommended that you take a prescription medicine daily to prevent HIV infection. This is called preexposure prophylaxis (PrEP). You are considered at risk if:  You are sexually active and do not regularly use condoms or know the HIV status of your partner(s).  You take drugs by injection.  You are sexually active with a partner who has HIV.  Talk with your health care provider about whether you are at high risk of being infected with HIV. If   you choose to begin PrEP, you should first be tested for HIV. You should then be tested every 3 months for as long as you are taking PrEP.  Osteoporosis is a disease in which the bones lose minerals and strength with aging. This can result in serious bone fractures or breaks. The risk of osteoporosis can be identified using a bone density scan. Women ages 67 years and over and women at risk for fractures or osteoporosis should discuss screening with their health care providers. Ask your health care provider whether you should take a calcium supplement or vitamin D to reduce the rate of osteoporosis.  Menopause can be associated with physical symptoms and risks. Hormone replacement therapy is available to decrease symptoms and risks. You should talk to your health care provider about whether hormone replacement therapy is right for you.  Use sunscreen. Apply sunscreen liberally and repeatedly throughout the day. You should seek shade when your shadow is shorter than you. Protect yourself by wearing long sleeves, pants, a wide-brimmed hat, and sunglasses year round, whenever you are outdoors.  Once a month, do a whole body skin exam, using a mirror to look at the skin on your back. Tell your health care provider of new moles, moles that have irregular borders, moles that  are larger than a pencil eraser, or moles that have changed in shape or color.  Stay current with required vaccines (immunizations).  Influenza vaccine. All adults should be immunized every year.  Tetanus, diphtheria, and acellular pertussis (Td, Tdap) vaccine. Pregnant women should receive 1 dose of Tdap vaccine during each pregnancy. The dose should be obtained regardless of the length of time since the last dose. Immunization is preferred during the 27th-36th week of gestation. An adult who has not previously received Tdap or who does not know her vaccine status should receive 1 dose of Tdap. This initial dose should be followed by tetanus and diphtheria toxoids (Td) booster doses every 10 years. Adults with an unknown or incomplete history of completing a 3-dose immunization series with Td-containing vaccines should begin or complete a primary immunization series including a Tdap dose. Adults should receive a Td booster every 10 years.  Varicella vaccine. An adult without evidence of immunity to varicella should receive 2 doses or a second dose if she has previously received 1 dose. Pregnant females who do not have evidence of immunity should receive the first dose after pregnancy. This first dose should be obtained before leaving the health care facility. The second dose should be obtained 4-8 weeks after the first dose.  Human papillomavirus (HPV) vaccine. Females aged 13-26 years who have not received the vaccine previously should obtain the 3-dose series. The vaccine is not recommended for use in pregnant females. However, pregnancy testing is not needed before receiving a dose. If a female is found to be pregnant after receiving a dose, no treatment is needed. In that case, the remaining doses should be delayed until after the pregnancy. Immunization is recommended for any person with an immunocompromised condition through the age of 61 years if she did not get any or all doses earlier. During the  3-dose series, the second dose should be obtained 4-8 weeks after the first dose. The third dose should be obtained 24 weeks after the first dose and 16 weeks after the second dose.  Zoster vaccine. One dose is recommended for adults aged 30 years or older unless certain conditions are present.  Measles, mumps, and rubella (MMR) vaccine. Adults born  before 1957 generally are considered immune to measles and mumps. Adults born in 1957 or later should have 1 or more doses of MMR vaccine unless there is a contraindication to the vaccine or there is laboratory evidence of immunity to each of the three diseases. A routine second dose of MMR vaccine should be obtained at least 28 days after the first dose for students attending postsecondary schools, health care workers, or international travelers. People who received inactivated measles vaccine or an unknown type of measles vaccine during 1963-1967 should receive 2 doses of MMR vaccine. People who received inactivated mumps vaccine or an unknown type of mumps vaccine before 1979 and are at high risk for mumps infection should consider immunization with 2 doses of MMR vaccine. For females of childbearing age, rubella immunity should be determined. If there is no evidence of immunity, females who are not pregnant should be vaccinated. If there is no evidence of immunity, females who are pregnant should delay immunization until after pregnancy. Unvaccinated health care workers born before 1957 who lack laboratory evidence of measles, mumps, or rubella immunity or laboratory confirmation of disease should consider measles and mumps immunization with 2 doses of MMR vaccine or rubella immunization with 1 dose of MMR vaccine.  Pneumococcal 13-valent conjugate (PCV13) vaccine. When indicated, a person who is uncertain of his immunization history and has no record of immunization should receive the PCV13 vaccine. All adults 65 years of age and older should receive this  vaccine. An adult aged 19 years or older who has certain medical conditions and has not been previously immunized should receive 1 dose of PCV13 vaccine. This PCV13 should be followed with a dose of pneumococcal polysaccharide (PPSV23) vaccine. Adults who are at high risk for pneumococcal disease should obtain the PPSV23 vaccine at least 8 weeks after the dose of PCV13 vaccine. Adults older than 71 years of age who have normal immune system function should obtain the PPSV23 vaccine dose at least 1 year after the dose of PCV13 vaccine.  Pneumococcal polysaccharide (PPSV23) vaccine. When PCV13 is also indicated, PCV13 should be obtained first. All adults aged 65 years and older should be immunized. An adult younger than age 65 years who has certain medical conditions should be immunized. Any person who resides in a nursing home or long-term care facility should be immunized. An adult smoker should be immunized. People with an immunocompromised condition and certain other conditions should receive both PCV13 and PPSV23 vaccines. People with human immunodeficiency virus (HIV) infection should be immunized as soon as possible after diagnosis. Immunization during chemotherapy or radiation therapy should be avoided. Routine use of PPSV23 vaccine is not recommended for American Indians, [place] Natives, or people younger than 65 years unless there are medical conditions that require PPSV23 vaccine. When indicated, people who have unknown immunization and have no record of immunization should receive PPSV23 vaccine. One-time revaccination 5 years after the first dose of PPSV23 is recommended for people aged 19-64 years who have chronic kidney failure, nephrotic syndrome, asplenia, or immunocompromised conditions. People who received 1-2 doses of PPSV23 before age 65 years should receive another dose of PPSV23 vaccine at age 65 years or later if at least 5 years have passed since the previous dose. Doses of PPSV23 are not  needed for people immunized with PPSV23 at or after age 65 years.  Meningococcal vaccine. Adults with asplenia or persistent complement component deficiencies should receive 2 doses of quadrivalent meningococcal conjugate (MenACWY-D) vaccine. The doses should be obtained   at least 2 months apart. Microbiologists working with certain meningococcal bacteria, [place] recruits, people at risk during an outbreak, and people who travel to or live in countries with a high rate of meningitis should be immunized. A first-year college student up through age 34 years who is living in a residence hall should receive a dose if she did not receive a dose on or after her 16th birthday. Adults who have certain high-risk conditions should receive one or more doses of vaccine.  Hepatitis A vaccine. Adults who wish to be protected from this disease, have certain high-risk conditions, work with hepatitis A-infected animals, work in hepatitis A research labs, or travel to or work in countries with a high rate of hepatitis A should be immunized. Adults who were previously unvaccinated and who anticipate close contact with an international adoptee during the first 60 days after arrival in the [place] from a country with a high rate of hepatitis A should be immunized.  Hepatitis B vaccine. Adults who wish to be protected from this disease, have certain high-risk conditions, may be exposed to blood or other infectious body fluids, are household contacts or sex partners of hepatitis B positive people, are clients or workers in certain care facilities, or travel to or work in countries with a high rate of hepatitis B should be immunized.  Haemophilus influenzae type b (Hib) vaccine. A previously unvaccinated person with asplenia or sickle cell disease or having a scheduled splenectomy should receive 1 dose of Hib vaccine. Regardless of previous immunization, a recipient of a hematopoietic stem cell transplant should receive a  3-dose series 6-12 months after her successful transplant. Hib vaccine is not recommended for adults with HIV infection. Preventive Services / Frequency Ages 35 to 4 years  Blood pressure check.** / Every 3-5 years.  Lipid and cholesterol check.** / Every 5 years beginning at age 60.  Clinical breast exam.** / Every 3 years for women in their 71s and 10s.  BRCA-related cancer risk assessment.** / For women who have family members with a BRCA-related cancer (breast, ovarian, tubal, or peritoneal cancers).  Pap test.** / Every 2 years from ages 76 through 26. Every 3 years starting at age 61 through age 76 or 93 with a history of 3 consecutive normal Pap tests.  HPV screening.** / Every 3 years from ages 37 through ages 60 to 51 with a history of 3 consecutive normal Pap tests.  Hepatitis C blood test.** / For any individual with known risks for hepatitis C.  Skin self-exam. / Monthly.  Influenza vaccine. / Every year.  Tetanus, diphtheria, and acellular pertussis (Tdap, Td) vaccine.** / Consult your health care provider. Pregnant women should receive 1 dose of Tdap vaccine during each pregnancy. 1 dose of Td every 10 years.  Varicella vaccine.** / Consult your health care provider. Pregnant females who do not have evidence of immunity should receive the first dose after pregnancy.  HPV vaccine. / 3 doses over 6 months, if 93 and younger. The vaccine is not recommended for use in pregnant females. However, pregnancy testing is not needed before receiving a dose.  Measles, mumps, rubella (MMR) vaccine.** / You need at least 1 dose of MMR if you were born in 1957 or later. You may also need a 2nd dose. For females of childbearing age, rubella immunity should be determined. If there is no evidence of immunity, females who are not pregnant should be vaccinated. If there is no evidence of immunity, females who are  pregnant should delay immunization until after pregnancy.  Pneumococcal  13-valent conjugate (PCV13) vaccine.** / Consult your health care provider.  Pneumococcal polysaccharide (PPSV23) vaccine.** / 1 to 2 doses if you smoke cigarettes or if you have certain conditions.  Meningococcal vaccine.** / 1 dose if you are age 68 to 8 years and a Market researcher living in a residence hall, or have one of several medical conditions, you need to get vaccinated against meningococcal disease. You may also need additional booster doses.  Hepatitis A vaccine.** / Consult your health care provider.  Hepatitis B vaccine.** / Consult your health care provider.  Haemophilus influenzae type b (Hib) vaccine.** / Consult your health care provider. Ages 7 to 53 years  Blood pressure check.** / Every year.  Lipid and cholesterol check.** / Every 5 years beginning at age 25 years.  Lung cancer screening. / Every year if you are aged 11-80 years and have a 30-pack-year history of smoking and currently smoke or have quit within the past 15 years. Yearly screening is stopped once you have quit smoking for at least 15 years or develop a health problem that would prevent you from having lung cancer treatment.  Clinical breast exam.** / Every year after age 48 years.  BRCA-related cancer risk assessment.** / For women who have family members with a BRCA-related cancer (breast, ovarian, tubal, or peritoneal cancers).  Mammogram.** / Every year beginning at age 41 years and continuing for as long as you are in good health. Consult with your health care provider.  Pap test.** / Every 3 years starting at age 65 years through age 37 or 70 years with a history of 3 consecutive normal Pap tests.  HPV screening.** / Every 3 years from ages 72 years through ages 60 to 40 years with a history of 3 consecutive normal Pap tests.  Fecal occult blood test (FOBT) of stool. / Every year beginning at age 21 years and continuing until age 5 years. You may not need to do this test if you get  a colonoscopy every 10 years.  Flexible sigmoidoscopy or colonoscopy.** / Every 5 years for a flexible sigmoidoscopy or every 10 years for a colonoscopy beginning at age 35 years and continuing until age 48 years.  Hepatitis C blood test.** / For all people born from [birth year] through 1965 and any individual with known risks for hepatitis C.  Skin self-exam. / Monthly.  Influenza vaccine. / Every year.  Tetanus, diphtheria, and acellular pertussis (Tdap/Td) vaccine.** / Consult your health care provider. Pregnant women should receive 1 dose of Tdap vaccine during each pregnancy. 1 dose of Td every 10 years.  Varicella vaccine.** / Consult your health care provider. Pregnant females who do not have evidence of immunity should receive the first dose after pregnancy.  Zoster vaccine.** / 1 dose for adults aged 30 years or older.  Measles, mumps, rubella (MMR) vaccine.** / You need at least 1 dose of MMR if you were born in 1957 or later. You may also need a second dose. For females of childbearing age, rubella immunity should be determined. If there is no evidence of immunity, females who are not pregnant should be vaccinated. If there is no evidence of immunity, females who are pregnant should delay immunization until after pregnancy.  Pneumococcal 13-valent conjugate (PCV13) vaccine.** / Consult your health care provider.  Pneumococcal polysaccharide (PPSV23) vaccine.** / 1 to 2 doses if you smoke cigarettes or if you have certain conditions.  Meningococcal vaccine.** /  Consult your health care provider.  Hepatitis A vaccine.** / Consult your health care provider.  Hepatitis B vaccine.** / Consult your health care provider.  Haemophilus influenzae type b (Hib) vaccine.** / Consult your health care provider. Ages 64 years and over  Blood pressure check.** / Every year.  Lipid and cholesterol check.** / Every 5 years beginning at age 23 years.  Lung cancer screening. / Every year if you  are aged 16-80 years and have a 30-pack-year history of smoking and currently smoke or have quit within the past 15 years. Yearly screening is stopped once you have quit smoking for at least 15 years or develop a health problem that would prevent you from having lung cancer treatment.  Clinical breast exam.** / Every year after age 74 years.  BRCA-related cancer risk assessment.** / For women who have family members with a BRCA-related cancer (breast, ovarian, tubal, or peritoneal cancers).  Mammogram.** / Every year beginning at age 44 years and continuing for as long as you are in good health. Consult with your health care provider.  Pap test.** / Every 3 years starting at age 58 years through age 22 or 39 years with 3 consecutive normal Pap tests. Testing can be stopped between 65 and 70 years with 3 consecutive normal Pap tests and no abnormal Pap or HPV tests in the past 10 years.  HPV screening.** / Every 3 years from ages 64 years through ages 70 or 61 years with a history of 3 consecutive normal Pap tests. Testing can be stopped between 65 and 70 years with 3 consecutive normal Pap tests and no abnormal Pap or HPV tests in the past 10 years.  Fecal occult blood test (FOBT) of stool. / Every year beginning at age 40 years and continuing until age 27 years. You may not need to do this test if you get a colonoscopy every 10 years.  Flexible sigmoidoscopy or colonoscopy.** / Every 5 years for a flexible sigmoidoscopy or every 10 years for a colonoscopy beginning at age 7 years and continuing until age 32 years.  Hepatitis C blood test.** / For all people born from [birth year] through 1966 and any individual with known risks for hepatitis C.  Osteoporosis screening.** / A one-time screening for women ages 30 years and over and women at risk for fractures or osteoporosis.  Skin self-exam. / Monthly.  Influenza vaccine. / Every year.  Tetanus, diphtheria, and acellular pertussis (Tdap/Td)  vaccine.** / 1 dose of Td every 10 years.  Varicella vaccine.** / Consult your health care provider.  Zoster vaccine.** / 1 dose for adults aged 35 years or older.  Pneumococcal 13-valent conjugate (PCV13) vaccine.** / Consult your health care provider.  Pneumococcal polysaccharide (PPSV23) vaccine.** / 1 dose for all adults aged 46 years and older.  Meningococcal vaccine.** / Consult your health care provider.  Hepatitis A vaccine.** / Consult your health care provider.  Hepatitis B vaccine.** / Consult your health care provider.  Haemophilus influenzae type b (Hib) vaccine.** / Consult your health care provider. ** Family history and personal history of risk and conditions may change your health care provider's recommendations.   This information is not intended to replace advice given to you by your health care provider. Make sure you discuss any questions you have with your health care provider.   Document Released: 05/01/2001 Document Revised: 03/26/2014 Document Reviewed: 07/31/2010 Elsevier Interactive Patient Education [employer].

## 2015-09-09 LAB — URINE CULTURE: Colony Count: >=100000

## 2015-09-15 ENCOUNTER — Telehealth: Payer: Self-pay | Admitting: Family Medicine

## 2015-09-15 ENCOUNTER — Telehealth: Payer: Self-pay | Admitting: Behavioral Health

## 2015-09-15 MED ORDER — CEPHALEXIN 500 MG PO CAPS: 500.0000 mg | ORAL_CAPSULE | Freq: Two times a day (BID) | ORAL | Status: DC

## 2015-09-15 MED ORDER — CIPROFLOXACIN HCL 250 MG PO TABS: 250.0000 mg | ORAL_TABLET | Freq: Two times a day (BID) | ORAL | Status: DC

## 2015-09-15 NOTE — Telephone Encounter (Signed)
Rx faxed to the pharmacy.    KP

## 2015-09-15 NOTE — Telephone Encounter (Signed)
Change to keflex 500 mg bid x 10 days

## 2015-09-15 NOTE — Telephone Encounter (Signed)
Caller: Hui at [facility]  Reason for call: Medication Interaction  She voiced that the patient has been ordered Cipro and currently takes Zanaflex, which causes an interaction between the two medications. Caller is requesting clarification to fill antibiotic. Please advise.

## 2015-09-15 NOTE — Telephone Encounter (Signed)
+  UTI-- cipro 250 mg bid x 3 days -- recheck 7-10 days Anemic---- check ifob, Take mvi with iron Cholesterol--- LDL goal < 100, HDL >40, TG < 150. Diet and exercise will increase HDL and decrease LDL and TG. Fish, Fish Oil, Flaxseed oil will also help increase the HDL and decrease Triglycerides.  Recheck labs in 3-6 months Lipid, cmp,, cbcd, ibc, ferritin.

## 2015-09-15 NOTE — Telephone Encounter (Signed)
Discussed with patient and she verbalized understanding.     KP

## 2015-09-15 NOTE — Telephone Encounter (Signed)
Results reviewed on mychart. Rx faxed and Ifob mailed.    KP

## 2015-09-15 NOTE — Telephone Encounter (Signed)
°  Relationship to patient: Self  Can be reached: [phone] or [phone] Pharmacy:  [facility] [address], [place] [phone] (Phone) [phone] (Fax)         Reason for call: Patient request that antibiotic be sent to rx for UTI. States she got a VM that she needed to take it but it was never sent in

## 2015-09-16 ENCOUNTER — Other Ambulatory Visit: Payer: Self-pay | Admitting: Internal Medicine

## 2015-10-17 ENCOUNTER — Other Ambulatory Visit: Payer: Self-pay | Admitting: Internal Medicine

## 2015-10-19 ENCOUNTER — Other Ambulatory Visit: Payer: Self-pay | Admitting: Internal Medicine

## 2015-10-28 ENCOUNTER — Telehealth: Payer: Self-pay | Admitting: Family Medicine

## 2015-10-28 DIAGNOSIS — R52 Pain, unspecified: Secondary | ICD-10-CM

## 2015-10-28 MED ORDER — OXYCODONE-ACETAMINOPHEN 10-325 MG PO TABS: 1.0000 | ORAL_TABLET | ORAL | 0 refills | Status: DC | PRN

## 2015-10-28 NOTE — Telephone Encounter (Signed)
Last seen and filled 09/06/15 #120 UDS 08/09/2014 low risk    Please advise     KP

## 2015-10-28 NOTE — Telephone Encounter (Signed)
Patient aware Rx will be ready for pick up on Monday.   KP 

## 2015-10-28 NOTE — Telephone Encounter (Signed)
°  Relationship to patient: Self  Can be reached: [phone]   Reason for call: Request refill on Oxycodone

## 2015-10-28 NOTE — Telephone Encounter (Signed)
Refill x1 

## 2015-11-15 ENCOUNTER — Other Ambulatory Visit: Payer: Self-pay | Admitting: Internal Medicine

## 2015-11-15 NOTE — Telephone Encounter (Signed)
May I refill Sir? 

## 2015-11-28 ENCOUNTER — Ambulatory Visit (INDEPENDENT_AMBULATORY_CARE_PROVIDER_SITE_OTHER): Payer: PPO | Admitting: Behavioral Health

## 2015-11-28 ENCOUNTER — Telehealth: Payer: Self-pay | Admitting: Family Medicine

## 2015-11-28 DIAGNOSIS — Z23 Encounter for immunization: Secondary | ICD-10-CM | POA: Diagnosis not present

## 2015-11-28 DIAGNOSIS — R52 Pain, unspecified: Secondary | ICD-10-CM

## 2015-11-28 MED ORDER — OXYCODONE-ACETAMINOPHEN 10-325 MG PO TABS: 1.0000 | ORAL_TABLET | ORAL | 0 refills | Status: DC | PRN

## 2015-11-28 NOTE — Telephone Encounter (Signed)
Last seen 6/20/7 and filled 10/28/15 #120 UDS 08/09/14 low risk   Please advise    KP

## 2015-11-28 NOTE — Telephone Encounter (Signed)
Patient aware Rx is ready for pick up.     KP

## 2015-11-28 NOTE — Telephone Encounter (Signed)
Relation to OI:LNZV Call back Rosa Sanchez   Reason for call:  Patient requesting a refil oxyCODONE-acetaminophen (PERCOCET) 10-325 MG tablet. Patient has a scheduled flu shot today and would like to pick up Rx. Please advise

## 2015-11-28 NOTE — Progress Notes (Addendum)
Pre visit review using our clinic review tool, if applicable. No additional management support is needed unless otherwise documented below in the visit note.  Patient in clinic today for Influenza vaccine. IM given in Right Deltoid. Patient tolerated injection well.

## 2015-11-28 NOTE — Telephone Encounter (Signed)
Refill x1 

## 2015-12-06 ENCOUNTER — Other Ambulatory Visit: Payer: Self-pay | Admitting: Family Medicine

## 2015-12-06 DIAGNOSIS — R52 Pain, unspecified: Secondary | ICD-10-CM

## 2015-12-08 NOTE — Telephone Encounter (Signed)
Last seen and filled 09/06/15 #90 with 1 rf   Please advise    KP

## 2015-12-15 ENCOUNTER — Other Ambulatory Visit: Payer: Self-pay | Admitting: Internal Medicine

## 2015-12-15 ENCOUNTER — Other Ambulatory Visit: Payer: Self-pay | Admitting: Family Medicine

## 2015-12-15 DIAGNOSIS — G4700 Insomnia, unspecified: Secondary | ICD-10-CM

## 2015-12-15 NOTE — Telephone Encounter (Signed)
Last seen and filled 09/06/15 #30 with 2 refills   Please advise    KP

## 2015-12-19 ENCOUNTER — Other Ambulatory Visit: Payer: Self-pay | Admitting: Internal Medicine

## 2015-12-20 DIAGNOSIS — L400 Psoriasis vulgaris: Secondary | ICD-10-CM | POA: Diagnosis not present

## 2015-12-20 DIAGNOSIS — Z79899 Other long term (current) drug therapy: Secondary | ICD-10-CM | POA: Diagnosis not present

## 2016-01-05 ENCOUNTER — Telehealth: Payer: Self-pay | Admitting: Family Medicine

## 2016-01-05 DIAGNOSIS — R52 Pain, unspecified: Secondary | ICD-10-CM

## 2016-01-05 MED ORDER — OXYCODONE-ACETAMINOPHEN 10-325 MG PO TABS: 1.0000 | ORAL_TABLET | ORAL | 0 refills | Status: DC | PRN

## 2016-01-05 NOTE — Telephone Encounter (Signed)
Did refill, called pt to let her know it is ready

## 2016-01-05 NOTE — Telephone Encounter (Signed)
Caller name: Relationship to patient: Self  Can be reached: [phone]    Reason for call: Refill on oxyCODONE-acetaminophen (PERCOCET) 10-325 MG tablet [[id number]]

## 2016-01-05 NOTE — Telephone Encounter (Signed)
Last 09/06/15 and filled 11/28/15 #120 UDS 08/09/2014 low risk    Please advise    KP

## 2016-01-31 ENCOUNTER — Ambulatory Visit (INDEPENDENT_AMBULATORY_CARE_PROVIDER_SITE_OTHER): Payer: PPO | Admitting: Physician Assistant

## 2016-01-31 ENCOUNTER — Telehealth: Payer: Self-pay | Admitting: Internal Medicine

## 2016-01-31 ENCOUNTER — Telehealth: Payer: Self-pay | Admitting: *Deleted

## 2016-01-31 ENCOUNTER — Encounter: Payer: Self-pay | Admitting: Physician Assistant

## 2016-01-31 VITALS — BP 170/88 | HR 72 | Ht 60.0 in | Wt 104.0 lb

## 2016-01-31 DIAGNOSIS — I10 Essential (primary) hypertension: Secondary | ICD-10-CM | POA: Diagnosis not present

## 2016-01-31 DIAGNOSIS — R55 Syncope and collapse: Secondary | ICD-10-CM

## 2016-01-31 DIAGNOSIS — I4891 Unspecified atrial fibrillation: Secondary | ICD-10-CM | POA: Diagnosis not present

## 2016-01-31 DIAGNOSIS — I5022 Chronic systolic (congestive) heart failure: Secondary | ICD-10-CM

## 2016-01-31 MED ORDER — CARVEDILOL 25 MG PO TABS: 25.0000 mg | ORAL_TABLET | Freq: Two times a day (BID) | ORAL | 2 refills | Status: DC

## 2016-01-31 NOTE — Telephone Encounter (Signed)
PT NOTIFIED TO START TAKING CARVEDILOL 25 MG TWICE A DAY PER URSUY

## 2016-01-31 NOTE — Telephone Encounter (Signed)
New message  No blood pressure taken.    Am medication taken.     Pt c/o Syncope: STAT if syncope occurred within 30 minutes and pt complains of lightheadedness High Priority if episode of passing out, completely, today or in last 24 hours   1. Did you pass out today? Yes  2. When is the last time you passed out?  This am - falling back - does not remember  3. Has this occurred multiple times? No   4. Did you have any symptoms prior to passing out? Feeling woozy / feel okay now slept for about hour 1/2 after.

## 2016-01-31 NOTE — Patient Instructions (Addendum)
Medication Instructions:   Your physician recommends that you continue on your current medications as directed. Please refer to the Current Medication list given to you today.  * NEW RX  START TAKING COREG 25 TWICE  A DAY *   PT NOTIFIED   If you need a refill on your cardiac medications before your next appointment, please call your pharmacy.  Labwork: NONE ORDERED  TODAY   Testing/Procedures: NONE ORDERED  TODAY    Follow-Up:  AS SCHEDULED ALREADY   Any Other Special Instructions Will Be Listed Below (If Applicable).  YOU HAVE BEEN RECOMMENDED TO FOLLOW UP WITH YOUR PRIMARY CARE DOCTOR AND PULMONOLOGIST TO REVALUATE FAINTING SPELLS.[name]  PLEASE GO TO EMERGENCY ROOM DEPARTMENT  IF SYMPTOMS RETURN OR HAVE OTHER SYMPTOMS

## 2016-01-31 NOTE — Progress Notes (Addendum)
Cardiology Office Note Date:  01/31/2016  Patient ID:  [name], [name] [birth year], MRN [id number] PCP:  Ann Held, DO  Cardiologist:  Dr. Percival Spanish Pulmonary: Dr. Halford Chessman Electrophysiologist: Dr. Caryl Comes   Chief Complaint: syncope  History of Present Illness: [name] is a 70 y.o. female with history of PAFib, HTN, Os dependent COPD, chronic pain syndrome, RLS, CHF, NICM w/ CRT-P implant (complicated by DVT LUE), with interval improvement in her EF, LBBB otherwise I do not see hx of conduction system disease.  She comes in today after a syncopal event this morning.  She is being seen for Dr. Caryl Comes, last seen by him in January, at that time her Coreg was up-titrated noting her BP was high at that visit, otherwise appears to have been doing well.  She was subsequently seen by Dr. Percival Spanish in April, also doing well at that time,no changes were made.  Today the patient was feeling well, had done her usual morning routine, was at the counter getting her kids' medications arranged when she suddenly felt dizzy, put her hands down quickly on the counter to steady herself, recalls knocking over a cup when she did then woke on te floor.  She had no CP, palpitations or particular SOB before or after, or of late. She was not wearing her O2 though states that her pulmonologist advised her to take breaks, and being off her O2 during this activity in the AM is not unusual.  Her husband accompanies her, states he heard her fall, went right in and she was awake, on the floor but seemed a little disoriented, buy the time he got her up and walked her to her bed she was back to normal.  She took a nap and woke feeling "fine".  She has had dull headaches lately, this in not unusual either.  Maybe mor often, not more severe or changed otherwise.  No weakness.  ADDEND: Late entry:  The patient did not think she hit her head, found herself more leaned up against the cabinet on the floor, denies  pain/trauma anywhere.   Device information: MDT CRT-P, implanted 12/21/09, Dr. Caryl Comes Hx of diaphragmatic stmulation (Dr. Caryl Comes mentions a variety of programming changes to eliinate)  AF Hx GIB on Pradaxa, not currently on a/c, only "little" noted by device check in Jan by Dr. Caryl Comes Amiodarone d/c appears secondary to concern given her lung issues  Past Medical History:  Diagnosis Date  . Anemia   . Anginal pain ([place])   . Arthritis    right hip  . Atrial fibrillation ([place])   . Blood transfusion   . CAD (coronary artery disease)    mild disease per cath in 2011  . CHF (congestive heart failure) ([place])   . Chronic abdominal pain   . Chronic nausea   . Chronic pain syndrome   . COPD (chronic obstructive pulmonary disease) ([facility])    continuous O2- 2l  . Diverticulitis   . Elbow fracture, left aug 2012  . Elevated liver enzymes   . Fibromyalgia    COUPLE OF TIMES A YEAR-PT HAS EPISODES OF CONFUSION-USUALLY INVOLVES DAY/NIGHT REVERSAL AND EPISODES OF TWITCHING AND FALLS--SEES DR. Jacelyn Grip - NEUROLOGIST-LAST SEEN 07/10/11  . Gastroparesis   . GERD (gastroesophageal reflux disease)   . H/O: GI bleed    from Pradaxa  . Hepatitis    hx of in high school   . HTN (hypertension)    under control; has been on med. x "years"  .  Hx MRSA infection   . Hx of gastric ulcer   . Hx of stress fx aug 2011   right hip   . LBBB (left bundle branch block)   . Oxygen dependent    2 liters via nasal cannula at all times  . Pacemaker    CRT therapy; followed by Dr. Caryl Comes  . Pleural effusion     s/p right thoracentesis 03/09  . Pneumonia - 03/09, 12/09, 02/10, 06/10   MOST RECENT FEB 2013  . Primary dilated cardiomyopathy ([facility])    EF 45 to 50% per echo in Jan 2012  . RLS (restless legs syndrome)   . Silent aspiration   . Small bowel obstruction   . Urinary incontinence     -INTERSTIM IMPLANT NOT FUNCTIONING PER PT  . 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 Vitamin B12 deficiency     Past Surgical History:  Procedure Laterality Date  . APPENDECTOMY    . CARDIAC CATHETERIZATION  04/08/2006, 11/16/2009  . COLECTOMY     Intestinal resection (5times)  . COLECTOMY  02/04/2000   ex. lap., intra-abd. subtotal colectomy with ileosigmoid colon anastomosies and lysis of adhesions  . CYSTOSCOPY  11/11/2008  . CYSTOSCOPY W/ RETROGRADES  11/11/2008   right  . CYSTOSCOPY WITH INJECTION  04/30/2006   transurethral collagen injection; incision vaginal stenosis  . ESOPHAGOGASTRODUODENOSCOPY N/A 02/02/2013   Procedure: ESOPHAGOGASTRODUODENOSCOPY (EGD);  Surgeon: Lafayette Dragon, MD;  Location: [facility];  Service: Endoscopy;  Laterality: N/A;  . EXPLORATORY LAPAROTOMY  04/27/2009   lysis of adhesions, gastrostomy tube  . GASTROCUTANEOUS FISTULA CLOSURE    . INTERSTIM IMPLANT PLACEMENT  05/28/2006 - stage I   06/05/2006 - stage II  . INTERSTIM IMPLANT PLACEMENT  02/05/2012   Procedure: [name] IMPLANT FIRST STAGE;  Surgeon: Reece Packer, MD;  Location: [facility];  Service: Urology;  Laterality: N/A;  Replacement of Interstim Lead     . INTERSTIM IMPLANT REVISION  03/06/2011   Procedure: REVISION OF [name];  Surgeon: Reece Packer, MD;  Location: [facility];  Service: Urology;  Laterality: N/A;  Replacement of Neurostimulator  . INTERSTIM IMPLANT REVISION  10/23/2007  . PACEMAKER PLACEMENT  12/21/2009  . Peg removed     with complications  . PORT-A-CATH REMOVAL  07/27/2011   Procedure: REMOVAL PORT-A-CATH;  Surgeon: Rolm Bookbinder, MD;  Location: [facility];  Service: General;  Laterality: N/A;  . PORTACATH PLACEMENT  12/16/2009  . SAVORY DILATION N/A 02/02/2013   Procedure: SAVORY DILATION;  Surgeon: Lafayette Dragon, MD;  Location: [facility];  Service: Endoscopy;  Laterality: N/A;  . SMALL INTESTINE SURGERY  05/20/2001   ex. lap., resection of small bowel stricture; gastrostomy; insertion central line  .  TONSILLECTOMY  age 42  . TOTAL ABDOMINAL HYSTERECTOMY     complete  . TOTAL HIP ARTHROPLASTY  08/03/2011   Procedure: TOTAL HIP ARTHROPLASTY ANTERIOR APPROACH;  Surgeon: Mcarthur Rossetti, MD;  Location: [facility];  Service: Orthopedics;  Laterality: Right;    Current Outpatient Prescriptions  Medication Sig Dispense Refill  . AMBULATORY NON FORMULARY MEDICATION Pt takes Oxygen, 2 units, every day    . calcium carbonate (OS-CAL - DOSED IN MG OF ELEMENTAL CALCIUM) 1250 (500 CA) MG tablet Take 2 tablets (1,000 mg of elemental calcium total) by mouth 3 (three) times daily with meals. 180 tablet 0  . carvedilol (COREG) 25 MG tablet Take 1 tablet (  25 mg total) by mouth 2 (two) times daily. 180 tablet 2  . denosumab (PROLIA) 60 MG/ML SOLN injection Inject 60 mg into the skin once. Administer in upper arm, thigh, or abdomen (Patient taking differently: Inject 60 mg into the skin every 6 (six) months. Administer in upper arm, thigh, or abdomen) 1.8 mL   . folic acid (FOLVITE) 1 MG tablet Take 1 mg by mouth daily.    . furosemide (LASIX) 40 MG tablet TAKE ONE TABLET BY MOUTH IN THE MORNING 90 tablet 3  . gabapentin (NEURONTIN) 600 MG tablet Take 1 tablet (600 mg total) by mouth 4 (four) times daily. 120 tablet 11  . loperamide (IMODIUM A-D) 2 MG tablet Take 1/2 tablet by mouth daily as needed for diarrhea. 30 tablet 2  . methotrexate 2.5 MG tablet Take 10 mg by mouth once a week.    [name] MYRBETRIQ 25 MG TB24 tablet Take 1 tablet by mouth daily.    . nitroGLYCERIN (NITROSTAT) 0.4 MG SL tablet Place 0.4 mg under the tongue every 5 (five) minutes as needed. CHEST PAIN    . oxyCODONE-acetaminophen (PERCOCET) 10-325 MG tablet Take 1 tablet by mouth every 4 (four) hours as needed. 120 tablet 0  . pantoprazole (PROTONIX) 40 MG tablet TAKE ONE TABLET BY MOUTH TWICE DAILY 60 tablet 0  . promethazine (PHENERGAN) 25 MG tablet TAKE ONE TABLET BY MOUTH EVERY 8 HOURS AS NEEDED FOR NAUSEA 40 tablet 1  . tiotropium  (SPIRIVA HANDIHALER) 18 MCG inhalation capsule Place 1 capsule (18 mcg total) into inhaler and inhale every morning. 10 capsule 0  . tiZANidine (ZANAFLEX) 4 MG tablet TAKE ONE TABLET BY MOUTH THREE TIMES DAILY AS NEEDED FOR MUSCLE SPASM 90 tablet 1  . zolpidem (AMBIEN) 5 MG tablet TAKE ONE TABLET BY MOUTH AT BEDTIME AS NEEDED 30 tablet 2   No current facility-administered medications for this visit.     Allergies:   Dabigatran etexilate mesylate; Talwin [pentazocine]; Pentazocine lactate; Lactate; and Sulfonamide derivatives   Social History:  The patient  reports that she quit smoking about 29 years ago. She has a 20.00 pack-year smoking history. She has never used smokeless tobacco. She reports that she does not drink alcohol or use drugs.   Family History:  The patient's family history includes Breast cancer in her maternal aunt; Diabetes in her paternal grandfather; Heart disease in her brother, father, mother, and paternal aunt; Stroke in her mother. ROS:  Please see the history of present illness.  All other systems are reviewed and otherwise negative.   PHYSICAL EXAM:  VS:  BP (!) 170/88   Pulse 72   Ht 5' (1.524 m)   Wt 104 lb (47.2 kg)   BMI 20.31 kg/m  BMI: Body mass index is 20.31 kg/m.98% O2 sat on 2l n/c O2 Orthostatic vitals are negative Well nourished though thin, well developed, in no acute distress  HEENT: normocephalic, atraumatic, no evidence of trauma, no tenderness to head Neck: no JVD, carotid bruits or masses Cardiac:  RRR no significant murmurs, no rubs, or gallops Lungs:  clear to auscultation bilaterally, no wheezing, rhonchi or rales  Abd: soft, nontender MS: no deformity or atrophy Ext: no edema  Skin: warm and dry, no rash Neuro:  No gross deficits appreciated Psych: euthymic mood, full affect  PPM site is stable, no tethering or discomfort   EKG:  Done today and reviewed by myself shows SR, V paced PPM interrogation today and reviewed by myself:  normal device  function.  No events/arrhythmias of any kind, battery and leads are stable, she 100% BiVe paced, she has just crossed above OptiVol threshold.   ADDEND: patient has underlying SR  05/18/14: TTE Study Conclusions - Left ventricle: The cavity size was normal. Wall thickness was normal. Systolic function was normal. The estimated ejection fraction was in the range of 60% to 65%. Wall motion was normal; there were no regional wall motion abnormalities. Doppler parameters are consistent with abnormal left ventricular relaxation (grade 1 diastolic dysfunction). The E/e&' ratio is between 8-15, suggesting an indeterminate LV filling pressure. - Mitral valve: Mildly thickened leaflets. There was trivial regurgitation. - Left atrium: The atrium was normal in size. - Right ventricle: The cavity size was normal. Wall thickness was normal. Pacer wire or catheter noted in right ventricle. - Right atrium: The atrium was normal in size. Pacer wire or catheter noted in right atrium. - Tricuspid valve: There was moderate regurgitation. - Pulmonary arteries: PA peak pressure: 30 mm Hg (S). Impressions - Compared to the prior study in 2012, the EF has normalized to 60-65%.   Recent Labs: 09/06/2015: ALT 11; BUN 21; Creatinine, Ser 0.91; Hemoglobin 10.8; Platelets 279.0; Potassium 5.1; Sodium 139  09/06/2015: Cholesterol 175; HDL 38.40; LDL Cholesterol 112; Total CHOL/HDL Ratio 5; Triglycerides 125.0; VLDL 25.0   CrCl cannot be calculated (Patient's most recent lab result is older than the maximum 21 days allowed.).   Wt Readings from Last 3 Encounters:  01/31/16 104 lb (47.2 kg)  09/06/15 103 lb (46.7 kg)  06/24/15 101 lb (45.8 kg)     Other studies reviewed: Additional studies/records reviewed today include: summarized above  ASSESSMENT AND PLAN:  1. Syncope     Pacer function is intact     No arrhythmias of any kind have been observed with her  device  Discussed/reviewed with Dr. Curt Bears, She is instructed to see her PMD for further evaluation of possible non-cardiac causes of fainting as well as her pulmonologist, she was off her O2 at the time Should she have any recurrent syncope or symptoms at all she (they) are instructed to seek medical care immediately.  She is made aware of [place] law, no driving 6 months or until cleared if etiology is discovered and corrected  2. Hx of AFib, atrial arrhythmia     Appears off a/c with GIB on Pradaxa historically     0% AF burden  3. NICM w/CRT-P     subsequent improvement in LVEF, last echo 05/2014     On BB, no ACE or ARB though EF has normalized by her last echo     OptiVol is slightly up, her exam and weight stable and do not suggest fluid OL  4. HTN     Uncontrolled, will increase her Coreg to '25mg'$  BID (med list already updated by MA when note is written, she is currently taking 12.'5mg'$  BID)   Disposition: F/u with Dr. Caryl Comes as already scheduled, she will see her PMD.  Current medicines are reviewed at length with the patient today.  The patient did not have any concerns regarding medicines.  Haywood Lasso, PA-C 01/31/2016 4:29 PM     [address] [address] [place] [address] [phone] (office)  [phone] (fax)

## 2016-01-31 NOTE — Telephone Encounter (Signed)
Patient states she passed out this morning. She states she remembers falling backwards and then woke up. She denies head injury/pain.  She states she landed on her shoulder.  She states she is feeling "fine" now, no dizziness, no SOB, no chest pain.  I advised her she should be seen for eval of syncopal episode. She states she is past due to have pacer checked. I made appt with Tommye Standard, PA-C today at 1500. She voiced understanding and agreed with plan.

## 2016-02-08 ENCOUNTER — Telehealth: Payer: Self-pay | Admitting: Family Medicine

## 2016-02-08 NOTE — Telephone Encounter (Signed)
[employer] verified PA on file thru 06/26/16- #[id number] 20% for prolia and admin Patient will owe approximately $220 out of pocket  Message sent to Gilmore Laroche to order [place] can you reach out to get patient scheduled?

## 2016-02-13 ENCOUNTER — Other Ambulatory Visit: Payer: Self-pay

## 2016-02-13 DIAGNOSIS — R52 Pain, unspecified: Secondary | ICD-10-CM

## 2016-02-13 MED ORDER — OXYCODONE-ACETAMINOPHEN 10-325 MG PO TABS: 1.0000 | ORAL_TABLET | ORAL | 0 refills | Status: DC | PRN

## 2016-02-13 NOTE — Telephone Encounter (Signed)
Patient is aware that Rx is ready for pick up. Rx is awaiting in file cabinet in front ofc.  PC

## 2016-02-13 NOTE — Telephone Encounter (Signed)
Patient has follow up appointment with Dr. Etter Sjogren 03/16/16  PC

## 2016-02-13 NOTE — Telephone Encounter (Signed)
Last seen 09/06/15 Last filled #120-0 rf 01/05/16 Sig: Take 1 tablet by mouth every 4 (four) hours as needed.  Please advise  PC

## 2016-02-14 ENCOUNTER — Ambulatory Visit: Payer: PPO | Admitting: Internal Medicine

## 2016-02-15 ENCOUNTER — Other Ambulatory Visit: Payer: Self-pay | Admitting: Internal Medicine

## 2016-02-21 ENCOUNTER — Ambulatory Visit (INDEPENDENT_AMBULATORY_CARE_PROVIDER_SITE_OTHER): Payer: PPO | Admitting: Internal Medicine

## 2016-02-21 ENCOUNTER — Other Ambulatory Visit (INDEPENDENT_AMBULATORY_CARE_PROVIDER_SITE_OTHER): Payer: PPO

## 2016-02-21 ENCOUNTER — Encounter: Payer: Self-pay | Admitting: Internal Medicine

## 2016-02-21 VITALS — BP 134/74 | HR 80 | Ht 60.0 in | Wt 102.4 lb

## 2016-02-21 DIAGNOSIS — R110 Nausea: Secondary | ICD-10-CM

## 2016-02-21 DIAGNOSIS — K56609 Unspecified intestinal obstruction, unspecified as to partial versus complete obstruction: Secondary | ICD-10-CM | POA: Diagnosis not present

## 2016-02-21 DIAGNOSIS — D638 Anemia in other chronic diseases classified elsewhere: Secondary | ICD-10-CM | POA: Diagnosis not present

## 2016-02-21 LAB — CBC WITH DIFFERENTIAL/PLATELET
Basophils Absolute: 0.1 10*3/uL (ref 0.0–0.1)
Basophils Relative: 0.8 % (ref 0.0–3.0)
Eosinophils Absolute: 0.2 10*3/uL (ref 0.0–0.7)
Eosinophils Relative: 2.7 % (ref 0.0–5.0)
HCT: 33.8 % — ABNORMAL LOW (ref 36.0–46.0)
Hemoglobin: 11 g/dL — ABNORMAL LOW (ref 12.0–15.0)
Lymphocytes Relative: 8.8 % — ABNORMAL LOW (ref 12.0–46.0)
Lymphs Abs: 0.7 10*3/uL (ref 0.7–4.0)
MCHC: 32.5 g/dL (ref 30.0–36.0)
MCV: 88.7 fl (ref 78.0–100.0)
Monocytes Absolute: 0.8 10*3/uL (ref 0.1–1.0)
Monocytes Relative: 9.2 % (ref 3.0–12.0)
Neutro Abs: 6.7 10*3/uL (ref 1.4–7.7)
Neutrophils Relative %: 78.5 % — ABNORMAL HIGH (ref 43.0–77.0)
Platelets: 383 10*3/uL (ref 150.0–400.0)
RBC: 3.81 Mil/uL — ABNORMAL LOW (ref 3.87–5.11)
RDW: 14.8 % (ref 11.5–15.5)
WBC: 8.5 10*3/uL (ref 4.0–10.5)

## 2016-02-21 LAB — FERRITIN: Ferritin: 232.4 ng/mL (ref 10.0–291.0)

## 2016-02-21 MED ORDER — PROMETHAZINE HCL 25 MG PO TABS: 25.0000 mg | ORAL_TABLET | Freq: Three times a day (TID) | ORAL | 5 refills | Status: DC | PRN

## 2016-02-21 NOTE — Progress Notes (Signed)
   [name] 70 y.o. [birth year] [id number]  Assessment & Plan:   Encounter Diagnoses  Name Primary?  [name] Anemia of chronic disease Yes  . Small bowel obstruction   . Chronic nausea     CBC, ferritin Refill promethazine See me 1 year  Lab Results  Component Value Date   WBC 8.5 02/21/2016   HGB 11.0 (L) 02/21/2016   HCT 33.8 (L) 02/21/2016   MCV 88.7 02/21/2016   PLT 383.0 02/21/2016   Lab Results  Component Value Date   FERRITIN 232.4 02/21/2016     Subjective:   Chief Complaint: f/u recurrent SBO and anemia  HPI She is here for f/u has hx recurrent SBO and manages at home - probably 12 episodes past year.  Askng for RF on promethazine to use prn for nausea. When she gets bloating, diarrheaand sxs of SBO she switches to liquids and waits it out.   Medications, allergies, past medical history, past surgical history, family history and social history are reviewed and updated in the EMR.  Review of Systems As above - still on home O2 - lung fx stable she says  Objective:   Physical Exam BP 134/74   Pulse 80   Ht 5' (1.524 m)   Wt 102 lb 6 oz (46.4 kg)   BMI 19.99 kg/m   On O2 Eyes anicteric Lungs coarse BS Ht S1S2 no rmg abd soft NT - well -healed surgical scars  15 minutes time spent with patient > half in counseling coordination of care

## 2016-02-21 NOTE — Patient Instructions (Signed)
   Your physician has requested that you go to the basement for the following lab work before leaving today: CBC/diff, Ferritin   We have sent the following medications to your pharmacy for you to pick up at your convenience: Promethazine     Follow up with Dr Carlean Purl in a year or sooner if needed.     I appreciate the opportunity to care for you. Silvano Rusk, MD, [facility]

## 2016-02-22 ENCOUNTER — Encounter: Payer: Self-pay | Admitting: Internal Medicine

## 2016-02-22 NOTE — Progress Notes (Signed)
Hemoglobin and iron levels are ok - slight anemia (not new) iron normal

## 2016-03-08 ENCOUNTER — Ambulatory Visit: Payer: PPO | Admitting: Family Medicine

## 2016-03-14 ENCOUNTER — Other Ambulatory Visit: Payer: Self-pay | Admitting: Internal Medicine

## 2016-03-15 ENCOUNTER — Other Ambulatory Visit: Payer: Self-pay | Admitting: Internal Medicine

## 2016-03-16 ENCOUNTER — Encounter: Payer: Self-pay | Admitting: Family Medicine

## 2016-03-16 ENCOUNTER — Ambulatory Visit (INDEPENDENT_AMBULATORY_CARE_PROVIDER_SITE_OTHER): Payer: PPO | Admitting: Family Medicine

## 2016-03-16 VITALS — BP 158/78 | HR 71 | Temp 97.8°F | Resp 16 | Ht 60.0 in | Wt 104.0 lb

## 2016-03-16 DIAGNOSIS — G4700 Insomnia, unspecified: Secondary | ICD-10-CM | POA: Diagnosis not present

## 2016-03-16 DIAGNOSIS — M810 Age-related osteoporosis without current pathological fracture: Secondary | ICD-10-CM | POA: Diagnosis not present

## 2016-03-16 DIAGNOSIS — R52 Pain, unspecified: Secondary | ICD-10-CM | POA: Diagnosis not present

## 2016-03-16 DIAGNOSIS — R55 Syncope and collapse: Secondary | ICD-10-CM

## 2016-03-16 DIAGNOSIS — Z79899 Other long term (current) drug therapy: Secondary | ICD-10-CM | POA: Diagnosis not present

## 2016-03-16 DIAGNOSIS — G894 Chronic pain syndrome: Secondary | ICD-10-CM

## 2016-03-16 DIAGNOSIS — I10 Essential (primary) hypertension: Secondary | ICD-10-CM | POA: Diagnosis not present

## 2016-03-16 DIAGNOSIS — K660 Peritoneal adhesions (postprocedural) (postinfection): Secondary | ICD-10-CM

## 2016-03-16 DIAGNOSIS — K3184 Gastroparesis: Secondary | ICD-10-CM

## 2016-03-16 DIAGNOSIS — Z79891 Long term (current) use of opiate analgesic: Secondary | ICD-10-CM | POA: Diagnosis not present

## 2016-03-16 LAB — BASIC METABOLIC PANEL
BUN: 18 mg/dL (ref 7–25)
CO2: 25 mmol/L (ref 20–31)
Calcium: 9.8 mg/dL (ref 8.6–10.4)
Chloride: 97 mmol/L — ABNORMAL LOW (ref 98–110)
Creat: 0.91 mg/dL (ref 0.60–0.93)
Glucose, Bld: 79 mg/dL (ref 65–99)
Potassium: 4.5 mmol/L (ref 3.5–5.3)
Sodium: 140 mmol/L (ref 135–146)

## 2016-03-16 MED ORDER — OXYCODONE-ACETAMINOPHEN 10-325 MG PO TABS: 1.0000 | ORAL_TABLET | ORAL | 0 refills | Status: DC | PRN

## 2016-03-16 MED ORDER — ZOLPIDEM TARTRATE 5 MG PO TABS: 5.0000 mg | ORAL_TABLET | Freq: Every evening | ORAL | 2 refills | Status: DC | PRN

## 2016-03-16 MED ORDER — TIZANIDINE HCL 4 MG PO TABS: 4.0000 mg | ORAL_TABLET | Freq: Three times a day (TID) | ORAL | 1 refills | Status: DC | PRN

## 2016-03-16 NOTE — Assessment & Plan Note (Signed)
Slightly elevated  tx per cardiology

## 2016-03-16 NOTE — Assessment & Plan Note (Signed)
Per GI / surgeon No recent episodes

## 2016-03-16 NOTE — Assessment & Plan Note (Signed)
Refill meds

## 2016-03-16 NOTE — Progress Notes (Signed)
Patient ID: [name], female    DOB: 1946-02-13  Age: 70 y.o. MRN: [id number]    Subjective:  Subjective  HPI [name] presents for refills of meds.  She is doing well --- no complaints.  She also needs labs for prolia injection  Review of Systems  Constitutional: Negative for appetite change, diaphoresis, fatigue and unexpected weight change.  Eyes: Negative for pain, redness and visual disturbance.  Respiratory: Negative for cough, chest tightness, shortness of breath and wheezing.   Cardiovascular: Negative for chest pain, palpitations and leg swelling.  Endocrine: Negative for cold intolerance, heat intolerance, polydipsia, polyphagia and polyuria.  Genitourinary: Negative for difficulty urinating, dysuria and frequency.  Neurological: Negative for dizziness, light-headedness, numbness and headaches.    History Past Medical History:  Diagnosis Date  . Anemia   . Anginal pain ([place])   . Arthritis    right hip  . Atrial fibrillation ([place])   . Blood transfusion   . CAD (coronary artery disease)    mild disease per cath in 2011  . CHF (congestive heart failure) ([place])   . Chronic abdominal pain   . Chronic nausea   . Chronic pain syndrome   . COPD (chronic obstructive pulmonary disease) ([facility])    continuous O2- 2l  . Diverticulitis   . Elbow fracture, left aug 2012  . Elevated liver enzymes   . Fibromyalgia    COUPLE OF TIMES A YEAR-PT HAS EPISODES OF CONFUSION-USUALLY INVOLVES DAY/NIGHT REVERSAL AND EPISODES OF TWITCHING AND FALLS--SEES DR. Jacelyn Grip - NEUROLOGIST-LAST SEEN 07/10/11  . Gastroparesis   . GERD (gastroesophageal reflux disease)   . H/O: GI bleed    from Pradaxa  . Hepatitis    hx of in high school   . HTN (hypertension)    under control; has been on med. x "years"  . Hx MRSA infection   . Hx of gastric ulcer   . Hx of stress fx aug 2011   right hip   . LBBB (left bundle branch block)   . Oxygen dependent    2 liters via nasal cannula at all  times  . Pacemaker    CRT therapy; followed by Dr. Caryl Comes  . Pleural effusion     s/p right thoracentesis 03/09  . Pneumonia - 03/09, 12/09, 02/10, 06/10   MOST RECENT FEB 2013  . Primary dilated cardiomyopathy ([facility])    EF 45 to 50% per echo in Jan 2012  . RLS (restless legs syndrome)   . Silent aspiration   . Small bowel obstruction   . Urinary incontinence     -INTERSTIM IMPLANT NOT FUNCTIONING PER PT  . Venous embolism and thrombosis of subclavian vein ([place])    after pacemaker insertion Oct 2011  . Vitamin B12 deficiency     She has a past surgical history that includes Appendectomy; pacemaker placement (12/21/2009); Peg removed; Tonsillectomy (age 72); Interstim implant revision (03/06/2011); Portacath placement (12/16/2009); Total abdominal hysterectomy; Interstim Implant placement (05/28/2006 - stage I); Interstim implant revision (10/23/2007); Cystoscopy with injection (04/30/2006); Small intestine surgery (05/20/2001); Cardiac catheterization (04/08/2006, 11/16/2009); Cystoscopy (11/11/2008); Cystoscopy w/ retrogrades (11/11/2008); Colectomy; Colectomy (02/04/2000); Gastrocutaneous fistula closure; Exploratory laparotomy (04/27/2009); Port-a-cath removal (07/27/2011); Total hip arthroplasty (08/03/2011); Interstim Implant placement (02/05/2012); Esophagogastroduodenoscopy (N/A, 02/02/2013); and Savory dilation (N/A, 02/02/2013).   Her family history includes Breast cancer in her maternal aunt; Diabetes in her paternal grandfather; Heart disease in her brother, father, mother, and paternal aunt; Stroke in her mother.She reports that she quit smoking  about 30 years ago. She has a 20.00 pack-year smoking history. She has never used smokeless tobacco. She reports that she does not drink alcohol or use drugs.  Current Outpatient Prescriptions on File Prior to Visit  Medication Sig Dispense Refill  . AMBULATORY NON FORMULARY MEDICATION Pt takes Oxygen, 2 units, every day    . calcium carbonate (OS-CAL -  DOSED IN MG OF ELEMENTAL CALCIUM) 1250 (500 CA) MG tablet Take 2 tablets (1,000 mg of elemental calcium total) by mouth 3 (three) times daily with meals. 180 tablet 0  . carvedilol (COREG) 25 MG tablet Take 1 tablet (25 mg total) by mouth 2 (two) times daily. 180 tablet 2  . denosumab (PROLIA) 60 MG/ML SOLN injection Inject 60 mg into the skin once. Administer in upper arm, thigh, or abdomen (Patient taking differently: Inject 60 mg into the skin every 6 (six) months. Administer in upper arm, thigh, or abdomen) 1.8 mL   . folic acid (FOLVITE) 1 MG tablet Take 1 mg by mouth daily.    . furosemide (LASIX) 40 MG tablet TAKE ONE TABLET BY MOUTH IN THE MORNING 90 tablet 3  . gabapentin (NEURONTIN) 600 MG tablet Take 1 tablet (600 mg total) by mouth 4 (four) times daily. (Patient taking differently: Take 600 mg by mouth 4 (four) times daily. As needed) 120 tablet 11  . loperamide (IMODIUM A-D) 2 MG tablet Take 1/2 tablet by mouth daily as needed for diarrhea. 30 tablet 2  . methotrexate 2.5 MG tablet Take 10 mg by mouth once a week.    [name] MYRBETRIQ 25 MG TB24 tablet Take 1 tablet by mouth daily.    . nitroGLYCERIN (NITROSTAT) 0.4 MG SL tablet Place 0.4 mg under the tongue every 5 (five) minutes as needed. CHEST PAIN    . pantoprazole (PROTONIX) 40 MG tablet TAKE ONE TABLET BY MOUTH TWICE DAILY 60 tablet 0  . promethazine (PHENERGAN) 25 MG tablet Take 1 tablet (25 mg total) by mouth every 8 (eight) hours as needed. for nausea 40 tablet 5  . tiotropium (SPIRIVA HANDIHALER) 18 MCG inhalation capsule Place 1 capsule (18 mcg total) into inhaler and inhale every morning. 10 capsule 0  . [DISCONTINUED] iron dextran complex (INFED) 50 MG/ML injection Please give infed infusion (no test dose needed) over 4 hours per pharmacy calculated dose. Ht: 5'2 Wt: 99 pounds Hgb: 12 1 mL 0   No current facility-administered medications on file prior to visit.      Objective:  Objective  Physical Exam  Constitutional:  She is oriented to person, place, and time. She appears well-developed and well-nourished.  HENT:  Head: Normocephalic and atraumatic.  Eyes: Conjunctivae and EOM are normal.  Neck: Normal range of motion. Neck supple. No JVD present. Carotid bruit is not present. No thyromegaly present.  Cardiovascular: Normal rate, regular rhythm and normal heart sounds.   No murmur heard. Pulmonary/Chest: Effort normal and breath sounds normal. No respiratory distress. She has no wheezes. She has no rales. She exhibits no tenderness.  Musculoskeletal: She exhibits no edema.  Neurological: She is alert and oriented to person, place, and time.  Psychiatric: She has a normal mood and affect. Her behavior is normal. Judgment and thought content normal.  Nursing note and vitals reviewed.  BP (!) 158/78 (BP Location: Right Arm, Cuff Size: Normal)   Pulse 71   Temp 97.8 F (36.6 C) (Oral)   Resp 16   Ht 5' (1.524 m)   Wt 104 lb (47.2 kg)  SpO2 97% Comment: on 2 liters of O2  BMI 20.31 kg/m  Wt Readings from Last 3 Encounters:  03/16/16 104 lb (47.2 kg)  02/21/16 102 lb 6 oz (46.4 kg)  01/31/16 104 lb (47.2 kg)     Lab Results  Component Value Date   WBC 8.5 02/21/2016   HGB 11.0 (L) 02/21/2016   HCT 33.8 (L) 02/21/2016   PLT 383.0 02/21/2016   GLUCOSE 93 09/06/2015   CHOL 175 09/06/2015   TRIG 125.0 09/06/2015   HDL 38.40 (L) 09/06/2015   LDLCALC 112 (H) 09/06/2015   ALT 11 09/06/2015   AST 16 09/06/2015   NA 139 09/06/2015   K 5.1 09/06/2015   CL 102 09/06/2015   CREATININE 0.91 09/06/2015   BUN 21 09/06/2015   CO2 32 09/06/2015   TSH 4.43 06/26/2010   INR 1.12 05/20/2014   HGBA1C  08/17/2007    5.6 (NOTE)   The ADA recommends the following therapeutic goals for glycemic   control related to Hgb A1C measurement:   Goal of Therapy:   < 7.0% Hgb A1C   Action Suggested:  > 8.0% Hgb A1C   Ref:  Diabetes Care, 22, Suppl. 1, 1999    Dg Bone Density  Result Date: 03/21/2015 Date of  study: 03/17/15 Exam: DUAL X-RAY ABSORPTIOMETRY (DXA) FOR BONE MINERAL DENSITY (BMD) Instrument: [employer] Provider: PCP Indication: screening for osteoporosis Comparison: none (please note that it is not possible to compare data from different instruments) Clinical data: Pt is a 70 y.o. female with h/o fractures. Results:  Lumbar spine (L1-L4) Femoral neck (FN) 33% distal radius T-score - 4.3 RFN: - 3.2 LFN: - 3.3 n/a Change in BMD from previous DXA test (%) Up 12.9% Up 13.7% n/a (*) statistically significant Assessment: Patient has OSTEOPOROSIS according to the [facility] classification for osteoporosis (see below). Fracture risk: high Comments: the technical quality of the study is good Evaluation for secondary causes should be considered if clinically indicated. Recommend optimizing calcium (1200 mg/day) and vitamin D (800 IU/day). Followup: Repeat BMD is appropriate after 2 years or after 1-2 years if starting treatment. WHO criteria for diagnosis of osteoporosis in postmenopausal women and in men 67 y/o or older: - normal: T-score -1.0 to + 1.0 - osteopenia/low bone density: T-score between -2.5 and -1.0 - osteoporosis: T-score below -2.5 - severe osteoporosis: T-score below -2.5 with history of fragility fracture Note: although not part of the WHO classification, the presence of a fragility fracture, regardless of the T-score, should be considered diagnostic of osteoporosis, provided other causes for the fracture have been excluded. Treatment: The National Osteoporosis Foundation recommends that treatment be considered in postmenopausal women and men age 80 or older with: 1. Hip or vertebral (clinical or morphometric) fracture 2. T-score of - 2.5 or lower at the spine or hip 3. 10-year fracture probability by FRAX of at least 20% for a major osteoporotic fracture and 3% for a hip fracture Loura Pardon MD     Assessment & Plan:  Plan  I have changed Ms. Greear's tiZANidine and zolpidem. I am  also having her maintain her nitroGLYCERIN, loperamide, MYRBETRIQ, AMBULATORY NON FORMULARY MEDICATION, calcium carbonate, tiotropium, folic acid, methotrexate, denosumab, furosemide, gabapentin, pantoprazole, carvedilol, promethazine, and oxyCODONE-acetaminophen.  Meds ordered this encounter  Medications  . tiZANidine (ZANAFLEX) 4 MG tablet    Sig: Take 1 tablet (4 mg total) by mouth 3 (three) times daily as needed. for muscle spams    Dispense:  90 tablet  Refill:  1  . zolpidem (AMBIEN) 5 MG tablet    Sig: Take 1 tablet (5 mg total) by mouth at bedtime as needed.    Dispense:  30 tablet    Refill:  2  . oxyCODONE-acetaminophen (PERCOCET) 10-325 MG tablet    Sig: Take 1 tablet by mouth every 4 (four) hours as needed.    Dispense:  120 tablet    Refill:  0    Problem List Items Addressed This Visit      Unprioritized   Adhesions of intestine, very dense, with frozen abdomen    Per GI / surgeon No recent episodes       Chronic pain syndrome    Do to all surgeries /       Essential hypertension    Slightly elevated  tx per cardiology      GASTROPARESIS    Per gi      Insomnia    Refill meds       Relevant Medications   zolpidem (AMBIEN) 5 MG tablet   Osteoporosis - Primary    Check labs  Then rto for prolia      Relevant Orders   Basic Metabolic Panel (BMET)   SYNCOPE    Needs f/u pulm Not cardiac       Other Visit Diagnoses    Pain       Relevant Medications   tiZANidine (ZANAFLEX) 4 MG tablet   oxyCODONE-acetaminophen (PERCOCET) 10-325 MG tablet      Follow-up: Return in about 6 months (around 09/14/2016), or if symptoms worsen or fail to improve.  Ann Held, DO

## 2016-03-16 NOTE — Assessment & Plan Note (Signed)
Needs f/u pulm Not cardiac

## 2016-03-16 NOTE — Progress Notes (Signed)
Pre visit review using our clinic review tool, if applicable. No additional management support is needed unless otherwise documented below in the visit note. 

## 2016-03-16 NOTE — Assessment & Plan Note (Signed)
Per gi  

## 2016-03-16 NOTE — Patient Instructions (Signed)
Osteoporosis Introduction Osteoporosis happens when your bones become thinner and weaker. Weak bones can break (fracture) more easily when you slip or fall. Bones most at risk of breaking are in the hip, wrist, and spine. Follow these instructions at home:  Get enough calcium and vitamin D. These nutrients are good for your bones.  Exercise as told by your doctor.  Do not use any tobacco products. This includes cigarettes, chewing tobacco, and electronic cigarettes. If you need help quitting, ask your doctor.  Limit the amount of alcohol you drink.  Take medicines only as told by your doctor.  Keep all follow-up visits as told by your doctor. This is important.  Take care at home to prevent falls. Some ways to do this are:  Keep rooms well lit and tidy.  Put safety rails on your stairs.  Put a rubber mat in the bathroom and other places that are often wet or slippery. Get help right away if:  You fall.  You hurt yourself. This information is not intended to replace advice given to you by your health care provider. Make sure you discuss any questions you have with your health care provider. Document Released: 05/28/2011 Document Revised: 08/11/2015 Document Reviewed: 08/13/2013  2017 Elsevier

## 2016-03-16 NOTE — Assessment & Plan Note (Signed)
Check labs  Then rto for prolia

## 2016-03-16 NOTE — Assessment & Plan Note (Signed)
Do to all surgeries /

## 2016-03-21 NOTE — Telephone Encounter (Signed)
Prolia benefits re-verified for 2018 [place] required- [phone] Prolia is subject to 20% co-insurance $20 copay due at time of service Patient will owe approximately $220 out of pocket

## 2016-03-22 ENCOUNTER — Ambulatory Visit (INDEPENDENT_AMBULATORY_CARE_PROVIDER_SITE_OTHER): Payer: PPO

## 2016-03-22 DIAGNOSIS — M8000XA Age-related osteoporosis with current pathological fracture, unspecified site, initial encounter for fracture: Secondary | ICD-10-CM

## 2016-03-22 DIAGNOSIS — M818 Other osteoporosis without current pathological fracture: Secondary | ICD-10-CM | POA: Diagnosis not present

## 2016-03-22 MED ORDER — DENOSUMAB 60 MG/ML ~~LOC~~ SOLN
60.0000 mg | Freq: Once | SUBCUTANEOUS | Status: AC
  Administered 2016-03-22: 60 mg via SUBCUTANEOUS

## 2016-03-22 NOTE — Progress Notes (Signed)
Pre visit review using our clinic tool,if applicable. No additional management support is needed unless otherwise documented below in the visit note.     Patient in for Prolia injection per order from Dr. Etter Sjogren,  Given IM SQ Right arm. Patient tolerated well. Will notify patient when next injection due.

## 2016-04-13 ENCOUNTER — Ambulatory Visit (INDEPENDENT_AMBULATORY_CARE_PROVIDER_SITE_OTHER): Payer: PPO | Admitting: Internal Medicine

## 2016-04-13 ENCOUNTER — Encounter: Payer: Self-pay | Admitting: Internal Medicine

## 2016-04-13 VITALS — BP 142/68 | HR 71 | Ht 60.0 in | Wt 103.0 lb

## 2016-04-13 DIAGNOSIS — I5022 Chronic systolic (congestive) heart failure: Secondary | ICD-10-CM | POA: Diagnosis not present

## 2016-04-13 DIAGNOSIS — R55 Syncope and collapse: Secondary | ICD-10-CM

## 2016-04-13 DIAGNOSIS — I428 Other cardiomyopathies: Secondary | ICD-10-CM | POA: Diagnosis not present

## 2016-04-13 DIAGNOSIS — Z950 Presence of cardiac pacemaker: Secondary | ICD-10-CM | POA: Diagnosis not present

## 2016-04-13 DIAGNOSIS — I480 Paroxysmal atrial fibrillation: Secondary | ICD-10-CM

## 2016-04-13 NOTE — Patient Instructions (Signed)
Medication Instructions: Your physician recommends that you continue on your current medications as directed. Please refer to the Current Medication list given to you today.   Labwork: None Ordered  Procedures/Testing: None Ordered  Follow-Up: Remote monitoring is used to monitor your Pacemaker of ICD from home. This monitoring reduces the number of office visits required to check your device to one time per year. It allows [place] to keep an eye on the functioning of your device to ensure it is working properly. You are scheduled for a device check from home on 07/16/16. You may send your transmission at any time that day. If you have a wireless device, the transmission will be sent automatically. After your physician reviews your transmission, you will receive a postcard with your next transmission date.    Your physician wants you to follow-up in 1 YEAR with Dr. Caryl Comes. You will receive a reminder letter in the mail two months in advance. If you don't receive a letter, please call our office to schedule the follow-up appointment.   Any Additional Special Instructions Will Be Listed Below (If Applicable).     If you need a refill on your cardiac medications before your next appointment, please call your pharmacy.

## 2016-04-13 NOTE — Progress Notes (Signed)
Patient Care Team: Ann Held, DO as PCP - General Rolm Bookbinder, MD as Consulting Physician (General Surgery) Lafayette Dragon, MD (Inactive) as Consulting Physician (Gastroenterology) Gatha Mayer, MD as Consulting Physician (Gastroenterology) Rutherford Guys, MD as Consulting Physician (Ophthalmology) Crista Luria, MD as Consulting Physician (Dermatology) Deboraha Sprang, MD as Consulting Physician (Cardiology) Minus Breeding, MD as Consulting Physician (Cardiology)   HPI  [name] is a 71 y.o. female seen in followup for congestive heart failure in the setting of nonischemic cardiomyopathy. She underwent CRT-P implantation October 2011. This has been associated with significant improvement although complicated by left upper extremity deep venous thrombosis. She also has infrequent diaphragmatic stimulation this prompted a variety of programming change   Last echocardiogram demonstrated ejection fracton of 60-65% during hospitalization 3/16    She also has a history of atrial arrhythmias for which she was previously  treated with amiodarone now discontinued..  She has oxygen-dependent COPD  The patient had a syncopal episode. She was seen 11/17 at our office. Device interrogation was normal   she has no significant complaints  All her children   ( last four adopted) were together for Christmas  Past Medical History:  Diagnosis Date  . Anemia   . Anginal pain ([place])   . Arthritis    right hip  . Atrial fibrillation ([place])   . Blood transfusion   . CAD (coronary artery disease)    mild disease per cath in 2011  . CHF (congestive heart failure) ([place])   . Chronic abdominal pain   . Chronic nausea   . Chronic pain syndrome   . COPD (chronic obstructive pulmonary disease) ([facility])    continuous O2- 2l  . Diverticulitis   . Elbow fracture, left aug 2012  . Elevated liver enzymes   . Fibromyalgia    COUPLE OF TIMES A YEAR-PT HAS EPISODES OF CONFUSION-USUALLY INVOLVES  DAY/NIGHT REVERSAL AND EPISODES OF TWITCHING AND FALLS--SEES DR. Jacelyn Grip - NEUROLOGIST-LAST SEEN 07/10/11  . Gastroparesis   . GERD (gastroesophageal reflux disease)   . H/O: GI bleed    from Pradaxa  . Hepatitis    hx of in high school   . HTN (hypertension)    under control; has been on med. x "years"  . Hx MRSA infection   . Hx of gastric ulcer   . Hx of stress fx aug 2011   right hip   . LBBB (left bundle branch block)   . Oxygen dependent    2 liters via nasal cannula at all times  . Pacemaker    CRT therapy; followed by Dr. Caryl Comes  . Pleural effusion     s/p right thoracentesis 03/09  . Pneumonia - 03/09, 12/09, 02/10, 06/10   MOST RECENT FEB 2013  . Primary dilated cardiomyopathy ([facility])    EF 45 to 50% per echo in Jan 2012  . RLS (restless legs syndrome)   . Silent aspiration   . Small bowel obstruction   . Urinary incontinence     -INTERSTIM IMPLANT NOT FUNCTIONING PER PT  . Venous embolism and thrombosis of subclavian vein ([place])    after pacemaker insertion Oct 2011  . Vitamin B12 deficiency     Past Surgical History:  Procedure Laterality Date  . APPENDECTOMY    . CARDIAC CATHETERIZATION  04/08/2006, 11/16/2009  . COLECTOMY     Intestinal resection (5times)  . COLECTOMY  02/04/2000   ex. lap., intra-abd. subtotal colectomy with ileosigmoid colon anastomosies and  lysis of adhesions  . CYSTOSCOPY  11/11/2008  . CYSTOSCOPY W/ RETROGRADES  11/11/2008   right  . CYSTOSCOPY WITH INJECTION  04/30/2006   transurethral collagen injection; incision vaginal stenosis  . ESOPHAGOGASTRODUODENOSCOPY N/A 02/02/2013   Procedure: ESOPHAGOGASTRODUODENOSCOPY (EGD);  Surgeon: Lafayette Dragon, MD;  Location: [facility];  Service: Endoscopy;  Laterality: N/A;  . EXPLORATORY LAPAROTOMY  04/27/2009   lysis of adhesions, gastrostomy tube  . GASTROCUTANEOUS FISTULA CLOSURE    . INTERSTIM IMPLANT PLACEMENT  05/28/2006 - stage I   06/05/2006 - stage II  . INTERSTIM IMPLANT PLACEMENT  02/05/2012    Procedure: [name] IMPLANT FIRST STAGE;  Surgeon: Reece Packer, MD;  Location: [facility];  Service: Urology;  Laterality: N/A;  Replacement of Interstim Lead     . INTERSTIM IMPLANT REVISION  03/06/2011   Procedure: REVISION OF [name];  Surgeon: Reece Packer, MD;  Location: [facility];  Service: Urology;  Laterality: N/A;  Replacement of Neurostimulator  . INTERSTIM IMPLANT REVISION  10/23/2007  . PACEMAKER PLACEMENT  12/21/2009  . Peg removed     with complications  . PORT-A-CATH REMOVAL  07/27/2011   Procedure: REMOVAL PORT-A-CATH;  Surgeon: Rolm Bookbinder, MD;  Location: [facility];  Service: General;  Laterality: N/A;  . PORTACATH PLACEMENT  12/16/2009  . SAVORY DILATION N/A 02/02/2013   Procedure: SAVORY DILATION;  Surgeon: Lafayette Dragon, MD;  Location: [facility];  Service: Endoscopy;  Laterality: N/A;  . SMALL INTESTINE SURGERY  05/20/2001   ex. lap., resection of small bowel stricture; gastrostomy; insertion central line  . TONSILLECTOMY  age 41  . TOTAL ABDOMINAL HYSTERECTOMY     complete  . TOTAL HIP ARTHROPLASTY  08/03/2011   Procedure: TOTAL HIP ARTHROPLASTY ANTERIOR APPROACH;  Surgeon: Mcarthur Rossetti, MD;  Location: [facility];  Service: Orthopedics;  Laterality: Right;    Current Outpatient Prescriptions  Medication Sig Dispense Refill  . AMBULATORY NON FORMULARY MEDICATION Pt takes Oxygen, 2 units, every day    . calcium carbonate (OS-CAL - DOSED IN MG OF ELEMENTAL CALCIUM) 1250 (500 CA) MG tablet Take 2 tablets (1,000 mg of elemental calcium total) by mouth 3 (three) times daily with meals. 180 tablet 0  . carvedilol (COREG) 25 MG tablet Take 1 tablet (25 mg total) by mouth 2 (two) times daily. 180 tablet 2  . denosumab (PROLIA) 60 MG/ML SOLN injection Inject 60 mg into the skin once. Administer in upper arm, thigh, or abdomen (Patient taking differently: Inject 60 mg into the skin every 6 (six) months. Administer in upper arm, thigh, or  abdomen) 1.8 mL   . folic acid (FOLVITE) 1 MG tablet Take 1 mg by mouth daily.    . furosemide (LASIX) 40 MG tablet TAKE ONE TABLET BY MOUTH IN THE MORNING 90 tablet 3  . gabapentin (NEURONTIN) 600 MG tablet Take 1 tablet (600 mg total) by mouth 4 (four) times daily. (Patient taking differently: Take 600 mg by mouth 4 (four) times daily. As needed) 120 tablet 11  . methotrexate 2.5 MG tablet Take 10 mg by mouth once a week.    [name] MYRBETRIQ 25 MG TB24 tablet Take 1 tablet by mouth daily.    . nitroGLYCERIN (NITROSTAT) 0.4 MG SL tablet Place 0.4 mg under the tongue every 5 (five) minutes as needed. CHEST PAIN    . oxyCODONE-acetaminophen (PERCOCET) 10-325 MG tablet Take 1 tablet by mouth every 4 (four) hours as needed. 120 tablet 0  . pantoprazole (PROTONIX) 40  MG tablet TAKE ONE TABLET BY MOUTH TWICE DAILY 60 tablet 0  . promethazine (PHENERGAN) 25 MG tablet Take 1 tablet (25 mg total) by mouth every 8 (eight) hours as needed. for nausea 40 tablet 5  . tiotropium (SPIRIVA HANDIHALER) 18 MCG inhalation capsule Place 1 capsule (18 mcg total) into inhaler and inhale every morning. 10 capsule 0  . tiZANidine (ZANAFLEX) 4 MG tablet Take 1 tablet (4 mg total) by mouth 3 (three) times daily as needed. for muscle spams 90 tablet 1  . zolpidem (AMBIEN) 5 MG tablet Take 1 tablet (5 mg total) by mouth at bedtime as needed. 30 tablet 2   No current facility-administered medications for this visit.     Allergies  Allergen Reactions  . Dabigatran Etexilate Mesylate Other (See Comments)    INTERNAL BLEEDING-pradaxa  . Talwin [Pentazocine] Other (See Comments)    hallucinations  . Pentazocine Lactate Other (See Comments)    HALLUCINATIONS  . Lactate Rash  . Sulfonamide Derivatives Other (See Comments)    JITTERINESS     Review of Systems negative except from HPI and PMH  Physical Exam BP (!) 142/68   Pulse 71   Ht 5' (1.524 m)   Wt 103 lb (46.7 kg)   SpO2 96%   BMI 20.12 kg/m  Well developed  and nourished in no acute distress wearing O2 HENT normal Neck supple with JVP-flat Clear but decreased  Device pocket well healed; without hematoma or erythema.  There is no tethering Device  migration into arm pit Regular rate and rhythm, no murmurs or gallops Abd-soft with active BS No Clubbing cyanosis edema Skin-warm and dry A & Oriented  Grossly normal sensory and motor function   ECG demonstrates P synchronous pacing with a QR in lead 1 and a QS in lead V1 QRS duration 110 ms  Assessment and  Plan  Nonischemic cardiomyopathy interval resolution  Congestive heart failure diastolic     Atrial arrhythmia  Little by device interrogation  Pacemaker-CRT-[employer] The patient's device was interrogated.  The information was reviewed. No changes were made in the programming.     Diaphragmatic stimulation  Elevated blood pressure  Euvolemic continue current meds  We'll continue carvedilol. Blood pressure is elevated. I have encouraged her blood pressure cuff and check it at home to clarify whether this represents whitecoat hypertension. Based on the SPRINT TRIAL at [place] pressure would be about 120

## 2016-04-17 DIAGNOSIS — L400 Psoriasis vulgaris: Secondary | ICD-10-CM | POA: Diagnosis not present

## 2016-04-17 DIAGNOSIS — Z79899 Other long term (current) drug therapy: Secondary | ICD-10-CM | POA: Diagnosis not present

## 2016-04-23 ENCOUNTER — Telehealth: Payer: Self-pay | Admitting: Pulmonary Disease

## 2016-04-23 NOTE — Telephone Encounter (Signed)
Created in error.  Close msg.

## 2016-04-23 NOTE — Telephone Encounter (Signed)
Message created in error. Please close.

## 2016-04-26 LAB — CUP PACEART INCLINIC DEVICE CHECK
Battery Remaining Longevity: 34 mo
Battery Voltage: 2.94 V
Brady Statistic AP VP Percent: 1.15 %
Brady Statistic AP VS Percent: 0 %
Brady Statistic AS VP Percent: 98.8 %
Brady Statistic AS VS Percent: 0.04 %
Brady Statistic RA Percent Paced: 1.14 %
Brady Statistic RV Percent Paced: 99.94 %
Date Time Interrogation Session: 20180127011625
Implantable Lead Implant Date: 20111005
Implantable Lead Implant Date: 20111005
Implantable Lead Implant Date: 20111005
Implantable Lead Location: 753858
Implantable Lead Location: 753859
Implantable Lead Location: 753860
Implantable Lead Model: 4196
Implantable Lead Model: 5076
Implantable Lead Model: 5076
Implantable Pulse Generator Implant Date: 20111005
Lead Channel Impedance Value: 304 Ohm
Lead Channel Impedance Value: 342 Ohm
Lead Channel Impedance Value: 456 Ohm
Lead Channel Impedance Value: 475 Ohm
Lead Channel Impedance Value: 532 Ohm
Lead Channel Impedance Value: 551 Ohm
Lead Channel Impedance Value: 551 Ohm
Lead Channel Impedance Value: 570 Ohm
Lead Channel Impedance Value: 779 Ohm
Lead Channel Pacing Threshold Amplitude: 0.5 V
Lead Channel Pacing Threshold Amplitude: 0.5 V
Lead Channel Pacing Threshold Amplitude: 0.75 V
Lead Channel Pacing Threshold Pulse Width: 0.4 ms
Lead Channel Pacing Threshold Pulse Width: 0.4 ms
Lead Channel Pacing Threshold Pulse Width: 0.8 ms
Lead Channel Sensing Intrinsic Amplitude: 1.25 mV
Lead Channel Sensing Intrinsic Amplitude: 7.625 mV
Lead Channel Setting Pacing Amplitude: 1.25 V
Lead Channel Setting Pacing Amplitude: 2 V
Lead Channel Setting Pacing Amplitude: 2.5 V
Lead Channel Setting Pacing Pulse Width: 0.4 ms
Lead Channel Setting Pacing Pulse Width: 0.8 ms
Lead Channel Setting Sensing Sensitivity: 0.9 mV

## 2016-04-27 ENCOUNTER — Telehealth: Payer: Self-pay | Admitting: Family Medicine

## 2016-04-27 DIAGNOSIS — R52 Pain, unspecified: Secondary | ICD-10-CM

## 2016-04-27 NOTE — Telephone Encounter (Signed)
Patient is requesting a refill of oxyCODONE-acetaminophen (PERCOCET) 10-325 MG tablet Please advise   Phone: [phone]

## 2016-04-30 ENCOUNTER — Encounter: Payer: Self-pay | Admitting: Pulmonary Disease

## 2016-04-30 ENCOUNTER — Ambulatory Visit (INDEPENDENT_AMBULATORY_CARE_PROVIDER_SITE_OTHER): Payer: PPO | Admitting: Pulmonary Disease

## 2016-04-30 ENCOUNTER — Ambulatory Visit (INDEPENDENT_AMBULATORY_CARE_PROVIDER_SITE_OTHER)
Admission: RE | Admit: 2016-04-30 | Discharge: 2016-04-30 | Disposition: A | Discharge status: Home / Self Care | Payer: PPO | Source: Ambulatory Visit | Attending: Pulmonary Disease | Admitting: Pulmonary Disease

## 2016-04-30 VITALS — BP 112/80 | HR 66 | Ht 60.0 in | Wt 104.6 lb

## 2016-04-30 DIAGNOSIS — R053 Chronic cough: Secondary | ICD-10-CM

## 2016-04-30 DIAGNOSIS — R05 Cough: Secondary | ICD-10-CM

## 2016-04-30 DIAGNOSIS — J432 Centrilobular emphysema: Secondary | ICD-10-CM | POA: Diagnosis not present

## 2016-04-30 DIAGNOSIS — J9611 Chronic respiratory failure with hypoxia: Secondary | ICD-10-CM

## 2016-04-30 MED ORDER — UMECLIDINIUM BROMIDE 62.5 MCG/INH IN AEPB: 1.0000 | INHALATION_SPRAY | Freq: Every day | RESPIRATORY_TRACT | 5 refills | Status: DC

## 2016-04-30 NOTE — Progress Notes (Signed)
Current Outpatient Prescriptions on File Prior to Visit  Medication Sig  . AMBULATORY NON FORMULARY MEDICATION Pt takes Oxygen, 2 units, every day  . calcium carbonate (OS-CAL - DOSED IN MG OF ELEMENTAL CALCIUM) 1250 (500 CA) MG tablet Take 2 tablets (1,000 mg of elemental calcium total) by mouth 3 (three) times daily with meals.  . carvedilol (COREG) 25 MG tablet Take 1 tablet (25 mg total) by mouth 2 (two) times daily.  [name] denosumab (PROLIA) 60 MG/ML SOLN injection Inject 60 mg into the skin once. Administer in upper arm, thigh, or abdomen (Patient taking differently: Inject 60 mg into the skin every 6 (six) months. Administer in upper arm, thigh, or abdomen)  . folic acid (FOLVITE) 1 MG tablet Take 1 mg by mouth daily.  . furosemide (LASIX) 40 MG tablet TAKE ONE TABLET BY MOUTH IN THE MORNING  . gabapentin (NEURONTIN) 600 MG tablet Take 1 tablet (600 mg total) by mouth 4 (four) times daily. (Patient taking differently: Take 600 mg by mouth 4 (four) times daily. As needed)  . methotrexate 2.5 MG tablet Take 10 mg by mouth once a week.  [name] MYRBETRIQ 25 MG TB24 tablet Take 1 tablet by mouth daily.  . nitroGLYCERIN (NITROSTAT) 0.4 MG SL tablet Place 0.4 mg under the tongue every 5 (five) minutes as needed. CHEST PAIN  . oxyCODONE-acetaminophen (PERCOCET) 10-325 MG tablet Take 1 tablet by mouth every 4 (four) hours as needed.  . pantoprazole (PROTONIX) 40 MG tablet TAKE ONE TABLET BY MOUTH TWICE DAILY  . promethazine (PHENERGAN) 25 MG tablet Take 1 tablet (25 mg total) by mouth every 8 (eight) hours as needed. for nausea  . tiZANidine (ZANAFLEX) 4 MG tablet Take 1 tablet (4 mg total) by mouth 3 (three) times daily as needed. for muscle spams  . zolpidem (AMBIEN) 5 MG tablet Take 1 tablet (5 mg total) by mouth at bedtime as needed.  . [DISCONTINUED] iron dextran complex (INFED) 50 MG/ML injection Please give infed infusion (no test dose needed) over 4 hours per pharmacy calculated dose. Ht: 5'2 Wt: 99  pounds Hgb: 12   No current facility-administered medications on file prior to visit.      Chief Complaint  Patient presents with  . Follow-up    Pt c/o croupy cough x 2 months which on occassion becomes dry then back to productive. Pt wants to discuss getting back on a maint. inhaler.  Still using O2 @ 2 liters pulsed 24/7.      Pulmonary tests PFT 06/28/08 >> FEV1 1.95 (97%), FEV1% 70, TLC 3.88 (87%), DLCO 64% ONO 02/11/10 >> Test time 9 hrs 9 min, Mean SpO2 87%, low SpO2 80%, Spent 5hrs 57 min (65%) with SpO2 < 88%. PFT 10/10/11 >> FEV1 1.15 (59%), FEV1% 64, TLC 3.26 (73%), DLCO 35%, + BD CT chest 04/30/2011 >> Chronic multifocal nodular and patchy airspace densities throughout the right lung with mild BTX  Cardiac tests Echo 05/18/14 >> EF 60 to 16%, grade 1 diastolic dysfx, PAS 30 mmHg  Past medical history GERD, Gastroparesis, PUD, CAD, Dilated CM, HTN, s/p PM, LBBB, Atrial fibrillation, RLS, Chronic pain, Fibromyalgia, Psoriasis  Past surgical history, Family history, Social history, Allergies reviewed  Vital Signs BP 112/80 (BP Location: Left Arm, Cuff Size: Normal)   Pulse 66   Ht 5' (1.524 m)   Wt 104 lb 9.6 oz (47.4 kg)   SpO2 98%   BMI 20.43 kg/m   History of Present Illness [name] is a  71 y.o. female former smoker with COPD/emphysema, chronic hypoxic respiratory failure, and recurrent pneumonia with focal bronchiectasis.  She hasn't used spiriva for a while.  She has noted more cough and chest congestion.  She brings up clear to yellow sputum.  Denies hemopytsis, fever, chest pain.  She gets occasional wheeze.  Denies sinus congestion, sore throat.  She has been maintaining her weight.  Denies leg swelling.  She is on MTX for psoriasis.  Her dermatologist has been decreasing her MTX dose over past few months.  She thinks that her cough started picking up when her MTX dose was been decreased.  She wears 2 liters oxygen.  She notices more trouble with breathing  in cold weather.   Physical Exam  General - pleasant, wearing oxygen Eyes - wearing glasses ENT - no sinus tenderness, no oral exudate, no LAN Cardiac - regular, no murmur Chest - decreased BS, no wheeze/rales Back - no tenderness Abd - soft, non tender Ext - no edema Neuro - normal strength Skin - no rashes Psych - normal mood   Assessment/Plan  COPD with emphysema. - she has progressive cough - will repeat CXR - will resume LAMA >> have her start incruse - if her symptoms persist, then consider adding LABA >> prefer to avoid ICS with hx of recurrent pneumonias  Chronic respiratory failure with hypoxia. - continue 2 liters oxygen  Plaque psoriasis. - she will f/u with dermatology to monitor dosing of MTX  Recurrent SBO. - f/u with Dr. Carlean Purl with GI   Patient Instructions  Incruse 1 puff daily  Chest xray today  Follow up in 1 year    Chesley Mires, MD [facility] Pager:  [phone] 04/30/2016, 10:49 AM

## 2016-04-30 NOTE — Patient Instructions (Signed)
Incruse 1 puff daily  Chest xray today  Follow up in 1 year

## 2016-05-01 MED ORDER — OXYCODONE-ACETAMINOPHEN 10-325 MG PO TABS: 1.0000 | ORAL_TABLET | ORAL | 0 refills | Status: DC | PRN

## 2016-05-01 NOTE — Telephone Encounter (Signed)
Requesting:   OXYCODONE Contract   08/09/2014 UDS  LOW RISK----DUE Last OV   03/16/2016--NO SCHEDULED UPCOMING APPT. Last Refill   #120   With 0  Refills on 03/16/2016  Please Advise

## 2016-05-01 NOTE — Telephone Encounter (Signed)
Printed /PCP signed/called the patient informed hardcopy is ready for pickup at the front desk.

## 2016-05-01 NOTE — Telephone Encounter (Signed)
Refill x1 

## 2016-05-02 ENCOUNTER — Telehealth: Payer: Self-pay | Admitting: Pulmonary Disease

## 2016-05-02 NOTE — Telephone Encounter (Signed)
Spoke with pt. And her medication is requiring a PA. Marliss Czar is working on her PA. She will make an update on this. Informed pt. We updated her once we heard from her insurance with a decision.

## 2016-05-02 NOTE — Telephone Encounter (Signed)
lmomtcb x1 

## 2016-05-02 NOTE — Telephone Encounter (Signed)
Called to initiate the PA for the incruse---this is not on the [facility] formulary.  Other preferred meds are:  ipratropium 0.02% spiriva handihaler spiriva respimat  VS please advise on what route you would like to go.  We can try to do the PA or change this med. thanks

## 2016-05-07 NOTE — Telephone Encounter (Signed)
VS please advise if you want to proceed with the PA for the incruse or change to another medication that is covered.  thanks

## 2016-05-08 MED ORDER — TIOTROPIUM BROMIDE MONOHYDRATE 1.25 MCG/ACT IN AERS: 2.0000 | INHALATION_SPRAY | Freq: Every day | RESPIRATORY_TRACT | 5 refills | Status: DC

## 2016-05-08 NOTE — Telephone Encounter (Signed)
1.25 

## 2016-05-08 NOTE — Telephone Encounter (Signed)
Can change to spiriva respimat two puffs daily.

## 2016-05-08 NOTE — Telephone Encounter (Signed)
Spoke with pt. She is aware of medication change. Rx has been sent in. Nothing further was needed.

## 2016-05-08 NOTE — Telephone Encounter (Signed)
Pt aware of VS below message and voiced her understanding. VS please advise on dosage of spiriva 1.25 or 2.5. Thanks.

## 2016-05-17 ENCOUNTER — Other Ambulatory Visit: Payer: Self-pay | Admitting: Internal Medicine

## 2016-05-30 DIAGNOSIS — H5203 Hypermetropia, bilateral: Secondary | ICD-10-CM | POA: Diagnosis not present

## 2016-05-30 DIAGNOSIS — Z961 Presence of intraocular lens: Secondary | ICD-10-CM | POA: Diagnosis not present

## 2016-06-01 ENCOUNTER — Telehealth: Payer: Self-pay | Admitting: Family Medicine

## 2016-06-01 DIAGNOSIS — R52 Pain, unspecified: Secondary | ICD-10-CM

## 2016-06-01 MED ORDER — OXYCODONE-ACETAMINOPHEN 10-325 MG PO TABS: 1.0000 | ORAL_TABLET | ORAL | 0 refills | Status: DC | PRN

## 2016-06-01 NOTE — Telephone Encounter (Signed)
Refill x1 

## 2016-06-01 NOTE — Telephone Encounter (Signed)
.  Requesting:  Oxycodone Contract   08/09/2014 UDS   Low risk done on 03/16/2016----next is due on 09/14/2016 Last OV    03/16/2016----next scheduled appt in on 09/06/2016 Last Refill    #120 no refills on 05/01/2016  Please Advise

## 2016-06-01 NOTE — Telephone Encounter (Signed)
°  Relation to MH:[facility] Call back [place]   Reason for call:  Patient requesting a refill oxyCODONE-acetaminophen (PERCOCET) 10-325 MG tablet

## 2016-06-01 NOTE — Telephone Encounter (Signed)
Printed and PCP signed. Called the patient informed hardcopy is ready for pickup at the front desk.

## 2016-06-13 ENCOUNTER — Other Ambulatory Visit: Payer: Self-pay | Admitting: Family Medicine

## 2016-06-13 DIAGNOSIS — R52 Pain, unspecified: Secondary | ICD-10-CM

## 2016-06-13 DIAGNOSIS — G4700 Insomnia, unspecified: Secondary | ICD-10-CM

## 2016-06-13 NOTE — Telephone Encounter (Signed)
Requesting:   Zolpidem and tizanidine Contract     03/16/2016 UDS     Low risk next is due on 09/14/2016 Last OV    03/16/2016----next scheduled appt is on 09/06/2016 Last Refill    #30 with 2 refills on 03/16/2016  Please Advise

## 2016-06-14 NOTE — Telephone Encounter (Signed)
[facility] to [facility] [place].

## 2016-06-18 ENCOUNTER — Other Ambulatory Visit: Payer: Self-pay | Admitting: Internal Medicine

## 2016-06-18 ENCOUNTER — Other Ambulatory Visit: Payer: Self-pay | Admitting: Family Medicine

## 2016-06-18 DIAGNOSIS — G4700 Insomnia, unspecified: Secondary | ICD-10-CM

## 2016-06-19 ENCOUNTER — Telehealth: Payer: Self-pay | Admitting: *Deleted

## 2016-06-19 NOTE — Telephone Encounter (Signed)
[facility] contacted [place] and stated that they never receive the rx for ambien.  Spoke with pharmacist and gave rx.

## 2016-06-21 ENCOUNTER — Telehealth: Payer: Self-pay | Admitting: Pulmonary Disease

## 2016-06-21 NOTE — Telephone Encounter (Signed)
I don't have any suggestions. Hope she has a good trip. See her doctor here as planned when she gets bac

## 2016-06-21 NOTE — Telephone Encounter (Signed)
Spoke with pt about her results per CY. Pt states she does not feel like she has an infection. She states she is planning on going to [place] in a week and staying about a week.  Forwarded to [facility] for fyi and if he had any other recommendations

## 2016-06-21 NOTE — Telephone Encounter (Signed)
Spoke with the pt and notified of recs per VS  She verbalized understanding  Nothing further needed 

## 2016-06-21 NOTE — Telephone Encounter (Signed)
CXR showed bronchitis changes and some streak areas in the right lung that were non-specific. If she feels stable, then ok to travel. If she thinks she has an infection let us know. How long will she be gone and where is she going?

## 2016-06-21 NOTE — Telephone Encounter (Signed)
CY  Please Advise- in VS absence  Pt called in because she never received the results of her chest xray in feb. Pt is wanting the results given to her due to she is planning on going on a trip and she does not want to make plans if the results are not good. Pt is requesting another provider to look at results

## 2016-06-24 NOTE — Progress Notes (Signed)
HPI [name] presents for followup of a nonischemic cardiomyopathy and atrial fibrillation.  Since I last saw her [name] has seen Dr. Caryl Comes who increased her Coreg.  [name] had syncope in Nov without clear etiology.  [name] does not report any further events.  [name] has been doing well except [name] is starting to feel the effects of pollen.  [name] has a mild productive cough but no fevers or chills.   The patient denies any new symptoms such as chest discomfort, neck or arm discomfort. There has been no new shortness of breath, PND or orthopnea. There have been no reported palpitations, presyncope or syncope.  [name] wears O2 and has no new dyspnea if [name] is wearing this.      Allergies  Allergen Reactions  . Dabigatran Etexilate Mesylate Other (See Comments)    INTERNAL BLEEDING-pradaxa  . Talwin [Pentazocine] Other (See Comments)    hallucinations  . Pentazocine Lactate Other (See Comments)    HALLUCINATIONS  . Lactate Rash  . Sulfonamide Derivatives Other (See Comments)    JITTERINESS     Current Outpatient Prescriptions  Medication Sig Dispense Refill  . AMBULATORY NON FORMULARY MEDICATION Pt takes Oxygen, 2 units, every day    . calcium carbonate (OS-CAL - DOSED IN MG OF ELEMENTAL CALCIUM) 1250 (500 CA) MG tablet Take 2 tablets (1,000 mg of elemental calcium total) by mouth 3 (three) times daily with meals. 180 tablet 0  . carvedilol (COREG) 25 MG tablet Take 1 tablet (25 mg total) by mouth 2 (two) times daily. 180 tablet 2  . denosumab (PROLIA) 60 MG/ML SOLN injection Inject 60 mg into the skin once. Administer in upper arm, thigh, or abdomen (Patient taking differently: Inject 60 mg into the skin every 6 (six) months. Administer in upper arm, thigh, or abdomen) 1.8 mL   . folic acid (FOLVITE) 1 MG tablet Take 1 mg by mouth daily.    . furosemide (LASIX) 40 MG tablet TAKE ONE TABLET BY MOUTH IN THE MORNING 90 tablet 3  . gabapentin (NEURONTIN) 600 MG tablet Take 1 tablet (600 mg total) by mouth 4 (four)  times daily. (Patient taking differently: Take 600 mg by mouth 4 (four) times daily. As needed) 120 tablet 11  . methotrexate 2.5 MG tablet Take 5 mg by mouth once a week.     [name] MYRBETRIQ 25 MG TB24 tablet Take 1 tablet by mouth daily.    . nitroGLYCERIN (NITROSTAT) 0.4 MG SL tablet Place 1 tablet (0.4 mg total) under the tongue every 5 (five) minutes as needed. CHEST PAIN 25 tablet 1  . oxyCODONE-acetaminophen (PERCOCET) 10-325 MG tablet Take 1 tablet by mouth every 4 (four) hours as needed. 120 tablet 0  . pantoprazole (PROTONIX) 40 MG tablet TAKE 1 TABLET BY MOUTH TWICE DAILY 60 tablet 0  . promethazine (PHENERGAN) 25 MG tablet Take 1 tablet (25 mg total) by mouth every 8 (eight) hours as needed. for nausea 40 tablet 5  . Tiotropium Bromide Monohydrate (SPIRIVA RESPIMAT) 1.25 MCG/ACT AERS Inhale 2 puffs into the lungs daily. 1 Inhaler 5  . tiZANidine (ZANAFLEX) 4 MG tablet TAKE ONE TABLET BY MOUTH THREE TIMES DAILY AS NEEDED FOR MUSCLE SPASM 90 tablet 1  . umeclidinium bromide (INCRUSE ELLIPTA) 62.5 MCG/INH AEPB Inhale 1 puff into the lungs daily. 1 each 5  . zolpidem (AMBIEN) 5 MG tablet TAKE ONE TABLET BY MOUTH AT BEDTIME AS NEEDED 30 tablet 2   No current facility-administered medications for this visit.  Past Medical History:  Diagnosis Date  . Anemia   . Anginal pain ([place])   . Arthritis    right hip  . Atrial fibrillation ([name])   . Blood transfusion   . CAD (coronary artery disease)    mild disease per cath in 2011  . CHF (congestive heart failure) ([place])   . Chronic abdominal pain   . Chronic nausea   . Chronic pain syndrome   . COPD (chronic obstructive pulmonary disease) ([facility])    continuous O2- 2l  . Diverticulitis   . Elbow fracture, left aug 2012  . Elevated liver enzymes   . Fibromyalgia    COUPLE OF TIMES A YEAR-PT HAS EPISODES OF CONFUSION-USUALLY INVOLVES DAY/NIGHT REVERSAL AND EPISODES OF TWITCHING AND FALLS--SEES DR. Jacelyn Grip - NEUROLOGIST-LAST SEEN 07/10/11  .  Gastroparesis   . GERD (gastroesophageal reflux disease)   . H/O: GI bleed    from Pradaxa  . Hepatitis    hx of in high school   . HTN (hypertension)    under control; has been on med. x "years"  . Hx MRSA infection   . Hx of gastric ulcer   . Hx of stress fx aug 2011   right hip   . LBBB (left bundle branch block)   . Oxygen dependent    2 liters via nasal cannula at all times  . Pacemaker    CRT therapy; followed by Dr. Caryl Comes  . Pleural effusion     s/p right thoracentesis 03/09  . Pneumonia - 03/09, 12/09, 02/10, 06/10   MOST RECENT FEB 2013  . Primary dilated cardiomyopathy ([facility])    EF 45 to 50% per echo in Jan 2012  . RLS (restless legs syndrome)   . Silent aspiration   . Small bowel obstruction   . Urinary incontinence     -INTERSTIM IMPLANT NOT FUNCTIONING PER PT  . Venous embolism and thrombosis of subclavian vein ([place])    after pacemaker insertion Oct 2011  . Vitamin B12 deficiency     Past Surgical History:  Procedure Laterality Date  . APPENDECTOMY    . CARDIAC CATHETERIZATION  04/08/2006, 11/16/2009  . COLECTOMY     Intestinal resection (5times)  . COLECTOMY  02/04/2000   ex. lap., intra-abd. subtotal colectomy with ileosigmoid colon anastomosies and lysis of adhesions  . CYSTOSCOPY  11/11/2008  . CYSTOSCOPY W/ RETROGRADES  11/11/2008   right  . CYSTOSCOPY WITH INJECTION  04/30/2006   transurethral collagen injection; incision vaginal stenosis  . ESOPHAGOGASTRODUODENOSCOPY N/A 02/02/2013   Procedure: ESOPHAGOGASTRODUODENOSCOPY (EGD);  Surgeon: Lafayette Dragon, MD;  Location: [facility];  Service: Endoscopy;  Laterality: N/A;  . EXPLORATORY LAPAROTOMY  04/27/2009   lysis of adhesions, gastrostomy tube  . GASTROCUTANEOUS FISTULA CLOSURE    . INTERSTIM IMPLANT PLACEMENT  05/28/2006 - stage I   06/05/2006 - stage II  . INTERSTIM IMPLANT PLACEMENT  02/05/2012   Procedure: [name] IMPLANT FIRST STAGE;  Surgeon: Reece Packer, MD;  Location: [facility];  Service:  Urology;  Laterality: N/A;  Replacement of Interstim Lead     . INTERSTIM IMPLANT REVISION  03/06/2011   Procedure: REVISION OF [name];  Surgeon: Reece Packer, MD;  Location: [facility];  Service: Urology;  Laterality: N/A;  Replacement of Neurostimulator  . INTERSTIM IMPLANT REVISION  10/23/2007  . PACEMAKER PLACEMENT  12/21/2009  . Peg removed     with complications  . PORT-A-CATH REMOVAL  07/27/2011   Procedure: REMOVAL PORT-A-CATH;  Surgeon: Rolm Bookbinder,  MD;  Location: [facility];  Service: General;  Laterality: N/A;  . PORTACATH PLACEMENT  12/16/2009  . SAVORY DILATION N/A 02/02/2013   Procedure: SAVORY DILATION;  Surgeon: Lafayette Dragon, MD;  Location: [facility];  Service: Endoscopy;  Laterality: N/A;  . SMALL INTESTINE SURGERY  05/20/2001   ex. lap., resection of small bowel stricture; gastrostomy; insertion central line  . TONSILLECTOMY  age 31  . TOTAL ABDOMINAL HYSTERECTOMY     complete  . TOTAL HIP ARTHROPLASTY  08/03/2011   Procedure: TOTAL HIP ARTHROPLASTY ANTERIOR APPROACH;  Surgeon: Mcarthur Rossetti, MD;  Location: [facility];  Service: Orthopedics;  Laterality: Right;    ROS:   Numbness of her fingers and toes at times.  Otherwise, as stated in the HPI and negative for all other systems.  PHYSICAL EXAM BP 100/60   Pulse 70   Ht 5' (1.524 m)   Wt 104 lb 3.2 oz (47.3 kg)   SpO2 98%   BMI 20.35 kg/m  GENERAL:  Frail appearing.  No distress NECK:  No jugular venous distention, waveform within normal limits, carotid upstroke brisk and symmetric, no bruits, no thyromegaly LYMPHATICS:  No cervical, inguinal adenopathy LUNGS:  Clear to auscultation bilaterally BACK:  No CVA tenderness CHEST: CRT pocket intact HEART:  PMI not displaced or sustained,S1 and S2 within normal limits, no S3, no S4, no clicks, no rubs, no murmurs ABD:  Flat, positive bowel sounds normal in frequency in pitch, no bruits, no rebound, no guarding, no midline pulsatile mass,  no hepatomegaly, no splenomegaly EXT:  2 plus pulses throughout, no edema, no cyanosis no clubbing  EKG:  NA  ASSESSMENT AND PLAN   ATRIAL FIBRILLATION -  [name] has a CHA2DS2 - VASc score of 4 with a risk of stroke of 4%.  However [name] is not having symptomatic recurrences of this.  I reviewed her last device interrogation and [name] has had no atrial fib documented.  [name]  had some bleeding while on anticoagulation in the past. [name] has lung issues and so was taken off amiodarone in the past. At this point without recurrent palpitations no change in therapy is indicated.    CARDIOMYOPATHY, PRIMARY, DILATED -  Her EF is now reported to be 65%.   It was 50% in 2012.  [name] is euvolemic.  No change in meds is indciated.  No further imaging at this point.   HYPERTENSION -  The blood pressure is at target.   [name] tolerated in the increased dose of beta blocker.   No change in medications is indicated. We will continue with therapeutic lifestyle changes (TLC).  SYNCOPE - The etiology of this was not clear.  [name] has had no recurrence.  No further work up is suggested.  Of note [name] was not wearing her oxygen when [name] passed out.  It is not clear if this was related.

## 2016-06-25 ENCOUNTER — Encounter: Payer: Self-pay | Admitting: Cardiology

## 2016-06-25 ENCOUNTER — Ambulatory Visit (INDEPENDENT_AMBULATORY_CARE_PROVIDER_SITE_OTHER): Payer: PPO | Admitting: Cardiology

## 2016-06-25 VITALS — BP 100/60 | HR 70 | Ht 60.0 in | Wt 104.2 lb

## 2016-06-25 DIAGNOSIS — R55 Syncope and collapse: Secondary | ICD-10-CM

## 2016-06-25 DIAGNOSIS — I480 Paroxysmal atrial fibrillation: Secondary | ICD-10-CM | POA: Diagnosis not present

## 2016-06-25 MED ORDER — NITROGLYCERIN 0.4 MG SL SUBL: 0.4000 mg | SUBLINGUAL_TABLET | SUBLINGUAL | 1 refills | Status: DC | PRN

## 2016-06-25 NOTE — Patient Instructions (Signed)
Medication Instructions:  Continue current medications  Labwork: None Ordered  Testing/Procedures: None Ordered  Follow-Up: Your physician wants you to follow-up in: 1 Year. You will receive a reminder letter in the mail two months in advance. If you don't receive a letter, please call our office to schedule the follow-up appointment.   Any Other Special Instructions Will Be Listed Below (If Applicable).   If you need a refill on your cardiac medications before your next appointment, please call your pharmacy.   

## 2016-06-26 ENCOUNTER — Encounter: Payer: Self-pay | Admitting: Cardiology

## 2016-07-09 DIAGNOSIS — N390 Urinary tract infection, site not specified: Secondary | ICD-10-CM | POA: Diagnosis not present

## 2016-07-09 DIAGNOSIS — B961 Klebsiella pneumoniae [K. pneumoniae] as the cause of diseases classified elsewhere: Secondary | ICD-10-CM | POA: Diagnosis not present

## 2016-07-09 DIAGNOSIS — N3941 Urge incontinence: Secondary | ICD-10-CM | POA: Diagnosis not present

## 2016-07-11 ENCOUNTER — Other Ambulatory Visit: Payer: Self-pay | Admitting: Family Medicine

## 2016-07-11 DIAGNOSIS — L400 Psoriasis vulgaris: Secondary | ICD-10-CM | POA: Diagnosis not present

## 2016-07-11 DIAGNOSIS — R52 Pain, unspecified: Secondary | ICD-10-CM

## 2016-07-11 NOTE — Telephone Encounter (Signed)
Caller name: Relationship to patient: Self Can be reached: [phone]  Pharmacy:  Reason for call: Refill oxyCODONE-acetaminophen (PERCOCET) 10-325 MG tablet [[id number]

## 2016-07-11 NOTE — Telephone Encounter (Signed)
Requesting:   Oxycodone Contract    03/16/2016 UDS    Low risk next is due on 09/14/2016 Last OV     03/16/2016-----future appointment 09/06/2016 Last Refill    #120 no refills on 06/01/2016  Please Advise

## 2016-07-12 NOTE — Telephone Encounter (Signed)
Indication for chronic opioid: chronic pain-- scar tissue, mult surgeries Medication and dose: oxycodone 10/325 # pills per month: 120 Last UDS date: 09/15/2015 Pain contract signed (Y/N): 03/16/2016 Date narcotic database last reviewed (include red flags): 07/12/2016  Ok to refill x1

## 2016-07-13 MED ORDER — OXYCODONE-ACETAMINOPHEN 10-325 MG PO TABS: 1.0000 | ORAL_TABLET | ORAL | 0 refills | Status: DC | PRN

## 2016-07-13 NOTE — Telephone Encounter (Signed)
Called patient and let her know rx is ready for pick up.she voiced understanding    PC

## 2016-07-13 NOTE — Addendum Note (Signed)
Addended by: Magdalene Molly A on: 07/13/2016 04:22 PM   Modules accepted: Orders

## 2016-07-16 ENCOUNTER — Ambulatory Visit (INDEPENDENT_AMBULATORY_CARE_PROVIDER_SITE_OTHER): Payer: PPO | Admitting: *Deleted

## 2016-07-16 ENCOUNTER — Telehealth: Payer: Self-pay | Admitting: Cardiology

## 2016-07-16 DIAGNOSIS — I5022 Chronic systolic (congestive) heart failure: Secondary | ICD-10-CM

## 2016-07-16 DIAGNOSIS — I428 Other cardiomyopathies: Secondary | ICD-10-CM | POA: Diagnosis not present

## 2016-07-16 NOTE — Telephone Encounter (Signed)
Spoke with pt and reminded pt of remote transmission that is due today. Pt verbalized understanding.   

## 2016-07-17 ENCOUNTER — Ambulatory Visit (INDEPENDENT_AMBULATORY_CARE_PROVIDER_SITE_OTHER): Payer: PPO | Admitting: Family Medicine

## 2016-07-17 ENCOUNTER — Encounter: Payer: Self-pay | Admitting: Family Medicine

## 2016-07-17 ENCOUNTER — Encounter: Payer: Self-pay | Admitting: Cardiology

## 2016-07-17 VITALS — BP 127/53 | HR 75 | Temp 97.8°F | Resp 16 | Ht 60.0 in | Wt 103.6 lb

## 2016-07-17 DIAGNOSIS — H6123 Impacted cerumen, bilateral: Secondary | ICD-10-CM

## 2016-07-17 DIAGNOSIS — H6691 Otitis media, unspecified, right ear: Secondary | ICD-10-CM | POA: Diagnosis not present

## 2016-07-17 DIAGNOSIS — H6120 Impacted cerumen, unspecified ear: Secondary | ICD-10-CM | POA: Insufficient documentation

## 2016-07-17 LAB — CUP PACEART REMOTE DEVICE CHECK
Battery Remaining Longevity: 32 mo
Battery Voltage: 2.94 V
Brady Statistic AP VP Percent: 1.95 %
Brady Statistic AP VS Percent: 0 %
Brady Statistic AS VP Percent: 98.01 %
Brady Statistic AS VS Percent: 0.04 %
Brady Statistic RA Percent Paced: 1.94 %
Brady Statistic RV Percent Paced: 99.95 %
Date Time Interrogation Session: 20180501051625
Implantable Lead Implant Date: 20111005
Implantable Lead Implant Date: 20111005
Implantable Lead Implant Date: 20111005
Implantable Lead Location: 753858
Implantable Lead Location: 753859
Implantable Lead Location: 753860
Implantable Lead Model: 4196
Implantable Lead Model: 5076
Implantable Lead Model: 5076
Implantable Pulse Generator Implant Date: 20111005
Lead Channel Impedance Value: 323 Ohm
Lead Channel Impedance Value: 342 Ohm
Lead Channel Impedance Value: 380 Ohm
Lead Channel Impedance Value: 399 Ohm
Lead Channel Impedance Value: 418 Ohm
Lead Channel Impedance Value: 475 Ohm
Lead Channel Impedance Value: 475 Ohm
Lead Channel Impedance Value: 513 Ohm
Lead Channel Impedance Value: 589 Ohm
Lead Channel Pacing Threshold Amplitude: 0.5 V
Lead Channel Pacing Threshold Amplitude: 0.5 V
Lead Channel Pacing Threshold Pulse Width: 0.4 ms
Lead Channel Pacing Threshold Pulse Width: 0.4 ms
Lead Channel Sensing Intrinsic Amplitude: 0.625 mV
Lead Channel Sensing Intrinsic Amplitude: 0.625 mV
Lead Channel Sensing Intrinsic Amplitude: 13 mV
Lead Channel Sensing Intrinsic Amplitude: 7.625 mV
Lead Channel Setting Pacing Amplitude: 1.25 V
Lead Channel Setting Pacing Amplitude: 2 V
Lead Channel Setting Pacing Amplitude: 2.5 V
Lead Channel Setting Pacing Pulse Width: 0.4 ms
Lead Channel Setting Pacing Pulse Width: 0.8 ms
Lead Channel Setting Sensing Sensitivity: 0.9 mV

## 2016-07-17 MED ORDER — AMOXICILLIN-POT CLAVULANATE 875-125 MG PO TABS: 1.0000 | ORAL_TABLET | Freq: Two times a day (BID) | ORAL | 0 refills | Status: DC

## 2016-07-17 NOTE — Progress Notes (Signed)
Remote pacemaker transmission.   

## 2016-07-17 NOTE — Assessment & Plan Note (Signed)
r cleared successfully L unable to remove--- debrox for few days --- rto Friday for irrigation  If still unable to remove-- refer to ent

## 2016-07-17 NOTE — Patient Instructions (Addendum)
Get over the counter Debrox to put in your ears to soften the wax.  We will see you back in 1 week to try and clean again.    Earwax Buildup Your ears make a substance called earwax. It may also be called cerumen. Sometimes, too much earwax builds up in your ear canal. This can cause ear pain and make it harder for you to hear. CAUSES This condition is caused by too much earwax production or buildup. RISK FACTORS The following factors may make you more likely to develop this condition:  Cleaning your ears often with swabs.  Having narrow ear canals.  Having earwax that is overly thick or sticky.  Having eczema.  Being dehydrated. SYMPTOMS Symptoms of this condition include:  Reduced hearing.  Ear drainage.  Ear pain.  Ear itch.  A feeling of fullness in the ear or feeling that the ear is plugged.  Ringing in the ear.  Coughing. DIAGNOSIS Your health care provider can diagnose this condition based on your symptoms and medical history. Your health care provider will also do an ear exam to look inside your ear with a scope (otoscope). You may also have a hearing test. TREATMENT Treatment for this condition includes:  Over-the-counter or prescription ear drops to soften the earwax.  Earwax removal by a health care provider. This may be done:  By flushing the ear with body-temperature water.  With a medical instrument that has a loop at the end (earwax curette).  With a suction device. HOME CARE INSTRUCTIONS  Take over-the-counter and prescription medicines only as told by your health care provider.  Do not put any objects, including an ear swab, into your ear. You can clean the opening of your ear canal with a washcloth.  Drink enough water to keep your urine clear or pale yellow.  If you have frequent earwax buildup or you use hearing aids, consider seeing your health care provider every 6-12 months for routine preventive ear cleanings. Keep all follow-up visits  as told by your health care provider. SEEK MEDICAL CARE IF:  You have ear pain.  Your condition does not improve with treatment.  You have hearing loss.  You have blood, pus, or other fluid coming from your ear. This information is not intended to replace advice given to you by your health care provider. Make sure you discuss any questions you have with your health care provider. Document Released: 04/12/2004 Document Revised: 06/27/2015 Document Reviewed: 10/20/2014 Elsevier Interactive Patient Education  2017 [employer].

## 2016-07-17 NOTE — Progress Notes (Signed)
Pre visit review using our clinic review tool, if applicable. No additional management support is needed unless otherwise documented below in the visit note. 

## 2016-07-17 NOTE — Progress Notes (Signed)
Patient ID: [name], female   DOB: [birth year], 71 y.o.   MRN: [id number]     Subjective:    Patient ID: [name], female    DOB: [birth year], 71 y.o.   MRN: [id number]  Chief Complaint  Patient presents with  . Ear Fullness    right ear    HPI Patient is in today for right ear fullness in right ear for about 2 weeks.  Everything sounds muffled.    Past Medical History:  Diagnosis Date  . Anemia   . Anginal pain ([place])   . Arthritis    right hip  . Atrial fibrillation ([place])   . Blood transfusion   . CAD (coronary artery disease)    mild disease per cath in 2011  . CHF (congestive heart failure) ([place])   . Chronic abdominal pain   . Chronic nausea   . Chronic pain syndrome   . COPD (chronic obstructive pulmonary disease) (H[facility])    continuous O2- 2l  . Diverticulitis   . Elbow fracture, left aug 2012  . Elevated liver enzymes   . Fibromyalgia    COUPLE OF TIMES A YEAR-PT HAS EPISODES OF CONFUSION-USUALLY INVOLVES DAY/NIGHT REVERSAL AND EPISODES OF TWITCHING AND FALLS--SEES DR. Jacelyn Grip - NEUROLOGIST-LAST SEEN 07/10/11  . Gastroparesis   . GERD (gastroesophageal reflux disease)   . H/O: GI bleed    from Pradaxa  . Hepatitis    hx of in high school   . HTN (hypertension)    under control; has been on med. x "years"  . Hx MRSA infection   . Hx of gastric ulcer   . Hx of stress fx aug 2011   right hip   . LBBB (left bundle branch block)   . Oxygen dependent    2 liters via nasal cannula at all times  . Pacemaker    CRT therapy; followed by Dr. Caryl Comes  . Pleural effusion     s/p right thoracentesis 03/09  . Pneumonia - 03/09, 12/09, 02/10, 06/10   MOST RECENT FEB 2013  . Primary dilated cardiomyopathy (HCC)    EF 45 to 50% per echo in Jan 2012  . RLS (restless legs syndrome)   . Silent aspiration   . Small bowel obstruction ([place])   . Urinary incontinence     -INTERSTIM IMPLANT NOT FUNCTIONING PER PT  . Venous embolism and thrombosis of subclavian vein ([place])     after pacemaker insertion Oct 2011  . Vitamin B12 deficiency     Past Surgical History:  Procedure Laterality Date  . APPENDECTOMY    . CARDIAC CATHETERIZATION  04/08/2006, 11/16/2009  . COLECTOMY     Intestinal resection (5times)  . COLECTOMY  02/04/2000   ex. lap., intra-abd. subtotal colectomy with ileosigmoid colon anastomosies and lysis of adhesions  . CYSTOSCOPY  11/11/2008  . CYSTOSCOPY W/ RETROGRADES  11/11/2008   right  . CYSTOSCOPY WITH INJECTION  04/30/2006   transurethral collagen injection; incision vaginal stenosis  . ESOPHAGOGASTRODUODENOSCOPY N/A 02/02/2013   Procedure: ESOPHAGOGASTRODUODENOSCOPY (EGD);  Surgeon: Lafayette Dragon, MD;  Location: [facility];  Service: Endoscopy;  Laterality: N/A;  . EXPLORATORY LAPAROTOMY  04/27/2009   lysis of adhesions, gastrostomy tube  . GASTROCUTANEOUS FISTULA CLOSURE    . INTERSTIM IMPLANT PLACEMENT  05/28/2006 - stage I   06/05/2006 - stage II  . INTERSTIM IMPLANT PLACEMENT  02/05/2012   Procedure: [name] IMPLANT FIRST STAGE;  Surgeon: Reece Packer, MD;  Location: [facility];  Service:  Urology;  Laterality: N/A;  Replacement of Interstim Lead     . INTERSTIM IMPLANT REVISION  03/06/2011   Procedure: REVISION OF [name];  Surgeon: Reece Packer, MD;  Location: [facility];  Service: Urology;  Laterality: N/A;  Replacement of Neurostimulator  . INTERSTIM IMPLANT REVISION  10/23/2007  . PACEMAKER PLACEMENT  12/21/2009  . Peg removed     with complications  . PORT-A-CATH REMOVAL  07/27/2011   Procedure: REMOVAL PORT-A-CATH;  Surgeon: Rolm Bookbinder, MD;  Location: [facility];  Service: General;  Laterality: N/A;  . PORTACATH PLACEMENT  12/16/2009  . SAVORY DILATION N/A 02/02/2013   Procedure: SAVORY DILATION;  Surgeon: Lafayette Dragon, MD;  [facility];  Service: Endoscopy;  Laterality: N/A;  . SMALL INTESTINE SURGERY  05/20/2001   ex. lap., resection of small bowel stricture; gastrostomy; insertion  central line  . TONSILLECTOMY  age 56  . TOTAL ABDOMINAL HYSTERECTOMY     complete  . TOTAL HIP ARTHROPLASTY  08/03/2011   Procedure: TOTAL HIP ARTHROPLASTY ANTERIOR APPROACH;  Surgeon: Mcarthur Rossetti, MD;  [facility];  Service: Orthopedics;  Laterality: Right;    Family History  Problem Relation Age of Onset  . Heart disease Father   . Stroke Mother   . Heart disease Mother   . Heart disease Brother   . Breast cancer Maternal Aunt   . Heart disease Paternal Aunt   . Diabetes Paternal Grandfather   . Colon cancer Neg Hx     Social History   Social History  . Marital status: Married    Spouse name: N/A  . Number of children: 5  . Years of education: N/A   Occupational History  . disabled Retired   Social History Main Topics  . Smoking status: Former Smoker    Packs/day: 1.00    Years: 20.00    Quit date: 03/19/1986  . Smokeless tobacco: Never Used  . Alcohol use No  . Drug use: No  . Sexual activity: Yes   Other Topics Concern  . Not on file   Social History Narrative   Occupation: Disabled   Married - second time - biologic daughter (in [place]) from first marriage - adopted 4 3 alive with second marriage   Alcohol Use - no   Illicit Drug Use - no   Patient is a former smoker, quit 1988 x79yr, <1ppd.    Daily Caffeine Use   01/20/2015 - updated       Outpatient Medications Prior to Visit  Medication Sig Dispense Refill  . AMBULATORY NON FORMULARY MEDICATION Pt takes Oxygen, 2 units, every day    . calcium carbonate (OS-CAL - DOSED IN MG OF ELEMENTAL CALCIUM) 1250 (500 CA) MG tablet Take 2 tablets (1,000 mg of elemental calcium total) by mouth 3 (three) times daily with meals. 180 tablet 0  . carvedilol (COREG) 25 MG tablet Take 1 tablet (25 mg total) by mouth 2 (two) times daily. 180 tablet 2  . denosumab (PROLIA) 60 MG/ML SOLN injection Inject 60 mg into the skin once. Administer in upper arm, thigh, or abdomen (Patient taking differently: Inject 60  mg into the skin every 6 (six) months. Administer in upper arm, thigh, or abdomen) 1.8 mL   . folic acid (FOLVITE) 1 MG tablet Take 1 mg by mouth daily.    . furosemide (LASIX) 40 MG tablet TAKE ONE TABLET BY MOUTH IN THE MORNING 90 tablet 3  . gabapentin (NEURONTIN) 600 MG tablet  Take 1 tablet (600 mg total) by mouth 4 (four) times daily. (Patient taking differently: Take 600 mg by mouth 4 (four) times daily. As needed) 120 tablet 11  . methotrexate 2.5 MG tablet Take 5 mg by mouth once a week.     [name] MYRBETRIQ 25 MG TB24 tablet Take 1 tablet by mouth daily.    . nitroGLYCERIN (NITROSTAT) 0.4 MG SL tablet Place 1 tablet (0.4 mg total) under the tongue every 5 (five) minutes as needed. CHEST PAIN 25 tablet 1  . oxyCODONE-acetaminophen (PERCOCET) 10-325 MG tablet Take 1 tablet by mouth every 4 (four) hours as needed. 120 tablet 0  . pantoprazole (PROTONIX) 40 MG tablet TAKE 1 TABLET BY MOUTH TWICE DAILY 60 tablet 0  . promethazine (PHENERGAN) 25 MG tablet Take 1 tablet (25 mg total) by mouth every 8 (eight) hours as needed. for nausea 40 tablet 5  . Tiotropium Bromide Monohydrate (SPIRIVA RESPIMAT) 1.25 MCG/ACT AERS Inhale 2 puffs into the lungs daily. 1 Inhaler 5  . tiZANidine (ZANAFLEX) 4 MG tablet TAKE ONE TABLET BY MOUTH THREE TIMES DAILY AS NEEDED FOR MUSCLE SPASM 90 tablet 1  . umeclidinium bromide (INCRUSE ELLIPTA) 62.5 MCG/INH AEPB Inhale 1 puff into the lungs daily. 1 each 5  . zolpidem (AMBIEN) 5 MG tablet TAKE ONE TABLET BY MOUTH AT BEDTIME AS NEEDED 30 tablet 2   No facility-administered medications prior to visit.     Allergies  Allergen Reactions  . Dabigatran Etexilate Mesylate Other (See Comments)    INTERNAL BLEEDING-pradaxa  . Talwin [Pentazocine] Other (See Comments)    hallucinations  . Pentazocine Lactate Other (See Comments)    HALLUCINATIONS  . Lactate Rash  . Sulfonamide Derivatives Other (See Comments)    JITTERINESS     Review of Systems  Constitutional:  Negative for fever and malaise/fatigue.  HENT: Negative for congestion.        Right ear fullness   Eyes: Negative for blurred vision.  Respiratory: Negative for cough and shortness of breath.   Cardiovascular: Negative for chest pain, palpitations and leg swelling.  Gastrointestinal: Negative for vomiting.  Musculoskeletal: Negative for back pain.  Skin: Negative for rash.  Neurological: Negative for loss of consciousness and headaches.       Objective:    Physical Exam  Constitutional: She is oriented to person, place, and time. She appears well-developed and well-nourished. No distress.  HENT:  Head: Normocephalic and atraumatic.  Right Ear: There is tenderness. Tympanic membrane is injected and erythematous. Decreased hearing is noted.  Left Ear: Decreased hearing is noted.  Ears:  Eyes: Conjunctivae are normal.  Neck: Normal range of motion. No thyromegaly present.  Cardiovascular: Normal rate and regular rhythm.   Pulmonary/Chest: Effort normal and breath sounds normal. She has no wheezes.  Abdominal: Soft. Bowel sounds are normal. There is no tenderness.  Musculoskeletal: Normal range of motion. She exhibits no edema or deformity.  Neurological: She is alert and oriented to person, place, and time.  Skin: Skin is warm and dry. She is not diaphoretic.  Psychiatric: She has a normal mood and affect.    BP (!) 127/53 (BP Location: Right Arm, Cuff Size: Normal)   Pulse 75   Temp 97.8 F (36.6 C) (Oral)   Resp 16   Ht 5' (1.524 m)   Wt 103 lb 9.6 oz (47 kg)   SpO2 96% Comment: on 2 liters of O2  BMI 20.23 kg/m  Wt Readings from Last 3 Encounters:  07/17/16 103  lb 9.6 oz (47 kg)  06/25/16 104 lb 3.2 oz (47.3 kg)  04/30/16 104 lb 9.6 oz (47.4 kg)     Lab Results  Component Value Date   WBC 8.5 02/21/2016   HGB 11.0 (L) 02/21/2016   HCT 33.8 (L) 02/21/2016   PLT 383.0 02/21/2016   GLUCOSE 79 03/16/2016   CHOL 175 09/06/2015   TRIG 125.0 09/06/2015   HDL  38.40 (L) 09/06/2015   LDLCALC 112 (H) 09/06/2015   ALT 11 09/06/2015   AST 16 09/06/2015   NA 140 03/16/2016   K 4.5 03/16/2016   CL 97 (L) 03/16/2016   CREATININE 0.91 03/16/2016   BUN 18 03/16/2016   CO2 25 03/16/2016   TSH 4.43 06/26/2010   INR 1.12 05/20/2014   HGBA1C  08/17/2007    5.6 (NOTE)   The ADA recommends the following therapeutic goals for glycemic   control related to Hgb A1C measurement:   Goal of Therapy:   < 7.0% Hgb A1C   Action Suggested:  > 8.0% Hgb A1C   Ref:  Diabetes Care, 22, Suppl. 1, 1999    Lab Results  Component Value Date   TSH 4.43 06/26/2010   Lab Results  Component Value Date   WBC 8.5 02/21/2016   HGB 11.0 (L) 02/21/2016   HCT 33.8 (L) 02/21/2016   MCV 88.7 02/21/2016   PLT 383.0 02/21/2016   Lab Results  Component Value Date   NA 140 03/16/2016   K 4.5 03/16/2016   CO2 25 03/16/2016   GLUCOSE 79 03/16/2016   BUN 18 03/16/2016   CREATININE 0.91 03/16/2016   BILITOT 0.3 09/06/2015   ALKPHOS 83 09/06/2015   AST 16 09/06/2015   ALT 11 09/06/2015   PROT 7.4 09/06/2015   ALBUMIN 4.1 09/06/2015   CALCIUM 9.8 03/16/2016   ANIONGAP 4 (L) 05/23/2014   GFR 64.80 09/06/2015   Lab Results  Component Value Date   CHOL 175 09/06/2015   Lab Results  Component Value Date   HDL 38.40 (L) 09/06/2015   Lab Results  Component Value Date   LDLCALC 112 (H) 09/06/2015   Lab Results  Component Value Date   TRIG 125.0 09/06/2015   Lab Results  Component Value Date   CHOLHDL 5 09/06/2015   Lab Results  Component Value Date   HGBA1C  08/17/2007    5.6 (NOTE)   The ADA recommends the following therapeutic goals for glycemic   control related to Hgb A1C measurement:   Goal of Therapy:   < 7.0% Hgb A1C   Action Suggested:  > 8.0% Hgb A1C   Ref:  Diabetes Care, 22, Suppl. 1, 1999       Assessment & Plan:   Problem List Items Addressed This Visit    None    Visit Diagnoses    Bilateral impacted cerumen    -  Primary      I am  having Ms. Grigoryan start on amoxicillin-clavulanate. I am also having her maintain her MYRBETRIQ, AMBULATORY NON FORMULARY MEDICATION, calcium carbonate, folic acid, methotrexate, denosumab, furosemide, gabapentin, carvedilol, promethazine, umeclidinium bromide, Tiotropium Bromide Monohydrate, tiZANidine, zolpidem, pantoprazole, nitroGLYCERIN, and oxyCODONE-acetaminophen.  Meds ordered this encounter  Medications  . amoxicillin-clavulanate (AUGMENTIN) 875-125 MG tablet    Sig: Take 1 tablet by mouth 2 (two) times daily. For 10 days    Dispense:  20 tablet    Refill:  0   Ann Held, DO

## 2016-07-20 ENCOUNTER — Encounter: Payer: Self-pay | Admitting: Family Medicine

## 2016-07-20 ENCOUNTER — Ambulatory Visit (INDEPENDENT_AMBULATORY_CARE_PROVIDER_SITE_OTHER): Payer: PPO | Admitting: Family Medicine

## 2016-07-20 VITALS — BP 148/78 | HR 79 | Temp 98.1°F | Resp 16 | Ht 60.0 in | Wt 102.4 lb

## 2016-07-20 DIAGNOSIS — H6690 Otitis media, unspecified, unspecified ear: Secondary | ICD-10-CM

## 2016-07-20 NOTE — Progress Notes (Signed)
Subjective:    Patient ID: [name], female    DOB: Nov 19, 1945, 71 y.o.   MRN: [id number]  Chief Complaint  Patient presents with  . Cerumen Impaction    recheck both ears    HPI Patient is in today for cerumen impaction-- symptoms have improved   Past Medical History:  Diagnosis Date  . Anemia   . Anginal pain ([place])   . Arthritis    right hip  . Atrial fibrillation ([place])   . Blood transfusion   . CAD (coronary artery disease)    mild disease per cath in 2011  . CHF (congestive heart failure) ([place])   . Chronic abdominal pain   . Chronic nausea   . Chronic pain syndrome   . COPD (chronic obstructive pulmonary disease) ([facility])    continuous O2- 2l  . Diverticulitis   . Elbow fracture, left aug 2012  . Elevated liver enzymes   . Fibromyalgia    COUPLE OF TIMES A YEAR-PT HAS EPISODES OF CONFUSION-USUALLY INVOLVES DAY/NIGHT REVERSAL AND EPISODES OF TWITCHING AND FALLS--SEES DR. Jacelyn Grip - NEUROLOGIST-LAST SEEN 07/10/11  . Gastroparesis   . GERD (gastroesophageal reflux disease)   . H/O: GI bleed    from Pradaxa  . Hepatitis    hx of in high school   . HTN (hypertension)    under control; has been on med. x "years"  . Hx MRSA infection   . Hx of gastric ulcer   . Hx of stress fx aug 2011   right hip   . LBBB (left bundle branch block)   . Oxygen dependent    2 liters via nasal cannula at all times  . Pacemaker    CRT therapy; followed by Dr. Caryl Comes  . Pleural effusion     s/p right thoracentesis 03/09  . Pneumonia - 03/09, 12/09, 02/10, 06/10   MOST RECENT FEB 2013  . Primary dilated cardiomyopathy ([facility])    EF 45 to 50% per echo in Jan 2012  . RLS (restless legs syndrome)   . Silent aspiration   . Small bowel obstruction ([place])   . Urinary incontinence     -INTERSTIM IMPLANT NOT FUNCTIONING PER PT  . Venous embolism and thrombosis of subclavian vein ([place])    after pacemaker insertion Oct 2011  . Vitamin B12 deficiency     Past Surgical History:  Procedure  Laterality Date  . APPENDECTOMY    . CARDIAC CATHETERIZATION  04/08/2006, 11/16/2009  . COLECTOMY     Intestinal resection (5times)  . COLECTOMY  02/04/2000   ex. lap., intra-abd. subtotal colectomy with ileosigmoid colon anastomosies and lysis of adhesions  . CYSTOSCOPY  11/11/2008  . CYSTOSCOPY W/ RETROGRADES  11/11/2008   right  . CYSTOSCOPY WITH INJECTION  04/30/2006   transurethral collagen injection; incision vaginal stenosis  . ESOPHAGOGASTRODUODENOSCOPY N/A 02/02/2013   Procedure: ESOPHAGOGASTRODUODENOSCOPY (EGD);  Surgeon: Lafayette Dragon, MD;  Location: [facility];  Service: Endoscopy;  Laterality: N/A;  . EXPLORATORY LAPAROTOMY  04/27/2009   lysis of adhesions, gastrostomy tube  . GASTROCUTANEOUS FISTULA CLOSURE    . INTERSTIM IMPLANT PLACEMENT  05/28/2006 - stage I   06/05/2006 - stage II  . INTERSTIM IMPLANT PLACEMENT  02/05/2012   Procedure: [name] IMPLANT FIRST STAGE;  Surgeon: Reece Packer, MD;  [facility];  Service: Urology;  Laterality: N/A;  Replacement of Interstim Lead     . INTERSTIM IMPLANT REVISION  03/06/2011   Procedure: REVISION OF [name];  Surgeon: Nicki Reaper  A MacDiarmid, MD;  Location: [facility];  Service: Urology;  Laterality: N/A;  Replacement of Neurostimulator  . INTERSTIM IMPLANT REVISION  10/23/2007  . PACEMAKER PLACEMENT  12/21/2009  . Peg removed     with complications  . PORT-A-CATH REMOVAL  07/27/2011   Procedure: REMOVAL PORT-A-CATH;  Surgeon: Rolm Bookbinder, MD;  Location: [facility];  Service: General;  Laterality: N/A;  . PORTACATH PLACEMENT  12/16/2009  . SAVORY DILATION N/A 02/02/2013   Procedure: SAVORY DILATION;  Surgeon: Lafayette Dragon, MD;  [facility];  Service: Endoscopy;  Laterality: N/A;  . SMALL INTESTINE SURGERY  05/20/2001   ex. lap., resection of small bowel stricture; gastrostomy; insertion central line  . TONSILLECTOMY  age 84  . TOTAL ABDOMINAL HYSTERECTOMY     complete  . TOTAL HIP ARTHROPLASTY   08/03/2011   Procedure: TOTAL HIP ARTHROPLASTY ANTERIOR APPROACH;  Surgeon: Mcarthur Rossetti, MD;  [facility];  Service: Orthopedics;  Laterality: Right;    Family History  Problem Relation Age of Onset  . Heart disease Father   . Stroke Mother   . Heart disease Mother   . Heart disease Brother   . Breast cancer Maternal Aunt   . Heart disease Paternal Aunt   . Diabetes Paternal Grandfather   . Colon cancer Neg Hx     Social History   Social History  . Marital status: Married    Spouse name: N/A  . Number of children: 5  . Years of education: N/A   Occupational History  . disabled Retired   Social History Main Topics  . Smoking status: Former Smoker    Packs/day: 1.00    Years: 20.00    Quit date: 03/19/1986  . Smokeless tobacco: Never Used  . Alcohol use No  . Drug use: No  . Sexual activity: Yes   Other Topics Concern  . Not on file   Social History Narrative   Occupation: Disabled   Married - second time - biologic daughter (in [place]) from first marriage - adopted 4 3 alive with second marriage   Alcohol Use - no   Illicit Drug Use - no   Patient is a former smoker, quit 1988 x88yr, <1ppd.    Daily Caffeine Use   01/20/2015 - updated       Outpatient Medications Prior to Visit  Medication Sig Dispense Refill  . AMBULATORY NON FORMULARY MEDICATION Pt takes Oxygen, 2 units, every day    . amoxicillin-clavulanate (AUGMENTIN) 875-125 MG tablet Take 1 tablet by mouth 2 (two) times daily. For 10 days 20 tablet 0  . calcium carbonate (OS-CAL - DOSED IN MG OF ELEMENTAL CALCIUM) 1250 (500 CA) MG tablet Take 2 tablets (1,000 mg of elemental calcium total) by mouth 3 (three) times daily with meals. 180 tablet 0  . carvedilol (COREG) 25 MG tablet Take 1 tablet (25 mg total) by mouth 2 (two) times daily. 180 tablet 2  . denosumab (PROLIA) 60 MG/ML SOLN injection Inject 60 mg into the skin once. Administer in upper arm, thigh, or abdomen (Patient taking  differently: Inject 60 mg into the skin every 6 (six) months. Administer in upper arm, thigh, or abdomen) 1.8 mL   . folic acid (FOLVITE) 1 MG tablet Take 1 mg by mouth daily.    . furosemide (LASIX) 40 MG tablet TAKE ONE TABLET BY MOUTH IN THE MORNING 90 tablet 3  . gabapentin (NEURONTIN) 600 MG tablet Take 1 tablet (600 mg total)  by mouth 4 (four) times daily. (Patient taking differently: Take 600 mg by mouth 4 (four) times daily. As needed) 120 tablet 11  . methotrexate 2.5 MG tablet Take 5 mg by mouth once a week.     [name] MYRBETRIQ 25 MG TB24 tablet Take 1 tablet by mouth daily.    . nitroGLYCERIN (NITROSTAT) 0.4 MG SL tablet Place 1 tablet (0.4 mg total) under the tongue every 5 (five) minutes as needed. CHEST PAIN 25 tablet 1  . oxyCODONE-acetaminophen (PERCOCET) 10-325 MG tablet Take 1 tablet by mouth every 4 (four) hours as needed. 120 tablet 0  . pantoprazole (PROTONIX) 40 MG tablet TAKE 1 TABLET BY MOUTH TWICE DAILY 60 tablet 0  . promethazine (PHENERGAN) 25 MG tablet Take 1 tablet (25 mg total) by mouth every 8 (eight) hours as needed. for nausea 40 tablet 5  . Tiotropium Bromide Monohydrate (SPIRIVA RESPIMAT) 1.25 MCG/ACT AERS Inhale 2 puffs into the lungs daily. 1 Inhaler 5  . tiZANidine (ZANAFLEX) 4 MG tablet TAKE ONE TABLET BY MOUTH THREE TIMES DAILY AS NEEDED FOR MUSCLE SPASM 90 tablet 1  . umeclidinium bromide (INCRUSE ELLIPTA) 62.5 MCG/INH AEPB Inhale 1 puff into the lungs daily. 1 each 5  . zolpidem (AMBIEN) 5 MG tablet TAKE ONE TABLET BY MOUTH AT BEDTIME AS NEEDED 30 tablet 2   No facility-administered medications prior to visit.     Allergies  Allergen Reactions  . Dabigatran Etexilate Mesylate Other (See Comments)    INTERNAL BLEEDING-pradaxa  . Talwin [Pentazocine] Other (See Comments)    hallucinations  . Pentazocine Lactate Other (See Comments)    HALLUCINATIONS  . Lactate Rash  . Sulfonamide Derivatives Other (See Comments)    JITTERINESS     Review of Systems    Constitutional: Negative for fever and malaise/fatigue.  HENT: Negative for congestion.   Eyes: Negative for blurred vision.  Respiratory: Negative for shortness of breath.   Cardiovascular: Negative for chest pain, palpitations and leg swelling.  Gastrointestinal: Negative for abdominal pain, blood in stool and nausea.  Genitourinary: Negative for dysuria and frequency.  Musculoskeletal: Negative for falls.  Skin: Negative for rash.  Neurological: Negative for dizziness, loss of consciousness and headaches.  Endo/Heme/Allergies: Negative for environmental allergies.  Psychiatric/Behavioral: Negative for depression. The patient is not nervous/anxious.        Objective:    Physical Exam  HENT:  Right Ear: Hearing normal. There is tenderness. Tympanic membrane is injected.  Left Ear: Hearing normal.  Ears:  Nursing note and vitals reviewed.   BP (!) 148/78 (BP Location: Left Arm, Cuff Size: Normal)   Pulse 79   Temp 98.1 F (36.7 C) (Oral)   Resp 16   Ht 5' (1.524 m)   Wt 102 lb 6.4 oz (46.4 kg)   SpO2 97%   BMI 20.00 kg/m  Wt Readings from Last 3 Encounters:  07/20/16 102 lb 6.4 oz (46.4 kg)  07/17/16 103 lb 9.6 oz (47 kg)  06/25/16 104 lb 3.2 oz (47.3 kg)     Lab Results  Component Value Date   WBC 8.5 02/21/2016   HGB 11.0 (L) 02/21/2016   HCT 33.8 (L) 02/21/2016   PLT 383.0 02/21/2016   GLUCOSE 79 03/16/2016   CHOL 175 09/06/2015   TRIG 125.0 09/06/2015   HDL 38.40 (L) 09/06/2015   LDLCALC 112 (H) 09/06/2015   ALT 11 09/06/2015   AST 16 09/06/2015   NA 140 03/16/2016   K 4.5 03/16/2016   CL 97 (  L) 03/16/2016   CREATININE 0.91 03/16/2016   BUN 18 03/16/2016   CO2 25 03/16/2016   TSH 4.43 06/26/2010   INR 1.12 05/20/2014   HGBA1C  08/17/2007    5.6 (NOTE)   The ADA recommends the following therapeutic goals for glycemic   control related to Hgb A1C measurement:   Goal of Therapy:   < 7.0% Hgb A1C   Action Suggested:  > 8.0% Hgb A1C   Ref:  Diabetes  Care, 22, Suppl. 1, 1999    Lab Results  Component Value Date   TSH 4.43 06/26/2010   Lab Results  Component Value Date   WBC 8.5 02/21/2016   HGB 11.0 (L) 02/21/2016   HCT 33.8 (L) 02/21/2016   MCV 88.7 02/21/2016   PLT 383.0 02/21/2016   Lab Results  Component Value Date   NA 140 03/16/2016   K 4.5 03/16/2016   CO2 25 03/16/2016   GLUCOSE 79 03/16/2016   BUN 18 03/16/2016   CREATININE 0.91 03/16/2016   BILITOT 0.3 09/06/2015   ALKPHOS 83 09/06/2015   AST 16 09/06/2015   ALT 11 09/06/2015   PROT 7.4 09/06/2015   ALBUMIN 4.1 09/06/2015   CALCIUM 9.8 03/16/2016   ANIONGAP 4 (L) 05/23/2014   GFR 64.80 09/06/2015   Lab Results  Component Value Date   CHOL 175 09/06/2015   Lab Results  Component Value Date   HDL 38.40 (L) 09/06/2015   Lab Results  Component Value Date   LDLCALC 112 (H) 09/06/2015   Lab Results  Component Value Date   TRIG 125.0 09/06/2015   Lab Results  Component Value Date   CHOLHDL 5 09/06/2015   Lab Results  Component Value Date   HGBA1C  08/17/2007    5.6 (NOTE)   The ADA recommends the following therapeutic goals for glycemic   control related to Hgb A1C measurement:   Goal of Therapy:   < 7.0% Hgb A1C   Action Suggested:  > 8.0% Hgb A1C   Ref:  Diabetes Care, 22, Suppl. 1, 1999       Assessment & Plan:   Problem List Items Addressed This Visit    None    Visit Diagnoses    Otitis media, unspecified laterality, unspecified otitis media type    -  Primary    finish abx  Cerumen -- L ear, not impacted,  con't debrox  I am having [name] maintain her MYRBETRIQ, AMBULATORY NON FORMULARY MEDICATION, calcium carbonate, folic acid, methotrexate, denosumab, furosemide, gabapentin, carvedilol, promethazine, umeclidinium bromide, Tiotropium Bromide Monohydrate, tiZANidine, zolpidem, pantoprazole, nitroGLYCERIN, oxyCODONE-acetaminophen, and amoxicillin-clavulanate.  No orders of the defined types were placed in this  encounter.    Ann Held, DO

## 2016-07-20 NOTE — Progress Notes (Signed)
Pre visit review using our clinic review tool, if applicable. No additional management support is needed unless otherwise documented below in the visit note. 

## 2016-07-20 NOTE — Progress Notes (Deleted)
Patient ID: [name], female   DOB: 08-Nov-1945, 71 y.o.   MRN: [id number]    Subjective:    Patient ID: [name], female    DOB: Aug 17, 1945, 71 y.o.   MRN: [id number]  Chief Complaint  Patient presents with  . Cerumen Impaction    recheck both ears    HPI Patient is in today for follow up cerumen impaction in both ears.  She can hear a little better now.   Past Medical History:  Diagnosis Date  . Anemia   . Anginal pain ([place])   . Arthritis    right hip  . Atrial fibrillation ([place])   . Blood transfusion   . CAD (coronary artery disease)    mild disease per cath in 2011  . CHF (congestive heart failure) ([place])   . Chronic abdominal pain   . Chronic nausea   . Chronic pain syndrome   . COPD (chronic obstructive pulmonary disease) (HCC)    continuous O2- 2l  . Diverticulitis   . Elbow fracture, left aug 2012  . Elevated liver enzymes   . Fibromyalgia    COUPLE OF TIMES A YEAR-PT HAS EPISODES OF CONFUSION-USUALLY INVOLVES DAY/NIGHT REVERSAL AND EPISODES OF TWITCHING AND FALLS--SEES DR. Jacelyn Grip - NEUROLOGIST-LAST SEEN 07/10/11  . Gastroparesis   . GERD (gastroesophageal reflux disease)   . H/O: GI bleed    from Pradaxa  . Hepatitis    hx of in high school   . HTN (hypertension)    under control; has been on med. x "years"  . Hx MRSA infection   . Hx of gastric ulcer   . Hx of stress fx aug 2011   right hip   . LBBB (left bundle branch block)   . Oxygen dependent    2 liters via nasal cannula at all times  . Pacemaker    CRT therapy; followed by Dr. Caryl Comes  . Pleural effusion     s/p right thoracentesis 03/09  . Pneumonia - 03/09, 12/09, 02/10, 06/10   MOST RECENT FEB 2013  . Primary dilated cardiomyopathy (HCC)    EF 45 to 50% per echo in Jan 2012  . RLS (restless legs syndrome)   . Silent aspiration   . Small bowel obstruction ([place])   . Urinary incontinence     -INTERSTIM IMPLANT NOT FUNCTIONING PER PT  . Venous embolism and thrombosis of subclavian  vein ([place])    after pacemaker insertion Oct 2011  . Vitamin B12 deficiency     Past Surgical History:  Procedure Laterality Date  . APPENDECTOMY    . CARDIAC CATHETERIZATION  04/08/2006, 11/16/2009  . COLECTOMY     Intestinal resection (5times)  . COLECTOMY  02/04/2000   ex. lap., intra-abd. subtotal colectomy with ileosigmoid colon anastomosies and lysis of adhesions  . CYSTOSCOPY  11/11/2008  . CYSTOSCOPY W/ RETROGRADES  11/11/2008   right  . CYSTOSCOPY WITH INJECTION  04/30/2006   transurethral collagen injection; incision vaginal stenosis  . ESOPHAGOGASTRODUODENOSCOPY N/A 02/02/2013   Procedure: ESOPHAGOGASTRODUODENOSCOPY (EGD);  Surgeon: Lafayette Dragon, MD;  Location: [facility];  Service: Endoscopy;  Laterality: N/A;  . EXPLORATORY LAPAROTOMY  04/27/2009   lysis of adhesions, gastrostomy tube  . GASTROCUTANEOUS FISTULA CLOSURE    . INTERSTIM IMPLANT PLACEMENT  05/28/2006 - stage I   06/05/2006 - stage II  . INTERSTIM IMPLANT PLACEMENT  02/05/2012   Procedure: [name] IMPLANT FIRST STAGE;  Surgeon: Reece Packer, MD;  [facility]: [facility];  Service:  Urology;  Laterality: N/A;  Replacement of Interstim Lead     . INTERSTIM IMPLANT REVISION  03/06/2011   Procedure: REVISION OF [name];  Surgeon: Reece Packer, MD;  [facility];  Service: Urology;  Laterality: N/A;  Replacement of Neurostimulator  . INTERSTIM IMPLANT REVISION  10/23/2007  . PACEMAKER PLACEMENT  12/21/2009  . Peg removed     with complications  . PORT-A-CATH REMOVAL  07/27/2011   Procedure: REMOVAL PORT-A-CATH;  Surgeon: Rolm Bookbinder, MD;  Location: [facility];  Service: General;  Laterality: N/A;  . PORTACATH PLACEMENT  12/16/2009  . SAVORY DILATION N/A 02/02/2013   Procedure: SAVORY DILATION;  Surgeon: Lafayette Dragon, MD;  [facility];  Service: Endoscopy;  Laterality: N/A;  . SMALL INTESTINE SURGERY  05/20/2001   ex. lap., resection of small bowel stricture; gastrostomy;  insertion central line  . TONSILLECTOMY  age 35  . TOTAL ABDOMINAL HYSTERECTOMY     complete  . TOTAL HIP ARTHROPLASTY  08/03/2011   Procedure: TOTAL HIP ARTHROPLASTY ANTERIOR APPROACH;  Surgeon: Mcarthur Rossetti, MD;  [facility];  Service: Orthopedics;  Laterality: Right;    Family History  Problem Relation Age of Onset  . Heart disease Father   . Stroke Mother   . Heart disease Mother   . Heart disease Brother   . Breast cancer Maternal Aunt   . Heart disease Paternal Aunt   . Diabetes Paternal Grandfather   . Colon cancer Neg Hx     Social History   Social History  . Marital status: Married    Spouse name: N/A  . Number of children: 5  . Years of education: N/A   Occupational History  . disabled Retired   Social History Main Topics  . Smoking status: Former Smoker    Packs/day: 1.00    Years: 20.00    Quit date: 03/19/1986  . Smokeless tobacco: Never Used  . Alcohol use No  . Drug use: No  . Sexual activity: Yes   Other Topics Concern  . Not on file   Social History Narrative   Occupation: Disabled   Married - second time - biologic daughter (in [place]) from first marriage - adopted 4 3 alive with second marriage   Alcohol Use - no   Illicit Drug Use - no   Patient is a former smoker, quit 1988 x49yr, <1ppd.    Daily Caffeine Use   01/20/2015 - updated       Outpatient Medications Prior to Visit  Medication Sig Dispense Refill  . AMBULATORY NON FORMULARY MEDICATION Pt takes Oxygen, 2 units, every day    . amoxicillin-clavulanate (AUGMENTIN) 875-125 MG tablet Take 1 tablet by mouth 2 (two) times daily. For 10 days 20 tablet 0  . calcium carbonate (OS-CAL - DOSED IN MG OF ELEMENTAL CALCIUM) 1250 (500 CA) MG tablet Take 2 tablets (1,000 mg of elemental calcium total) by mouth 3 (three) times daily with meals. 180 tablet 0  . carvedilol (COREG) 25 MG tablet Take 1 tablet (25 mg total) by mouth 2 (two) times daily. 180 tablet 2  . denosumab (PROLIA) 60  MG/ML SOLN injection Inject 60 mg into the skin once. Administer in upper arm, thigh, or abdomen (Patient taking differently: Inject 60 mg into the skin every 6 (six) months. Administer in upper arm, thigh, or abdomen) 1.8 mL   . folic acid (FOLVITE) 1 MG tablet Take 1 mg by mouth daily.    . furosemide (  LASIX) 40 MG tablet TAKE ONE TABLET BY MOUTH IN THE MORNING 90 tablet 3  . gabapentin (NEURONTIN) 600 MG tablet Take 1 tablet (600 mg total) by mouth 4 (four) times daily. (Patient taking differently: Take 600 mg by mouth 4 (four) times daily. As needed) 120 tablet 11  . methotrexate 2.5 MG tablet Take 5 mg by mouth once a week.     [name] MYRBETRIQ 25 MG TB24 tablet Take 1 tablet by mouth daily.    . nitroGLYCERIN (NITROSTAT) 0.4 MG SL tablet Place 1 tablet (0.4 mg total) under the tongue every 5 (five) minutes as needed. CHEST PAIN 25 tablet 1  . oxyCODONE-acetaminophen (PERCOCET) 10-325 MG tablet Take 1 tablet by mouth every 4 (four) hours as needed. 120 tablet 0  . pantoprazole (PROTONIX) 40 MG tablet TAKE 1 TABLET BY MOUTH TWICE DAILY 60 tablet 0  . promethazine (PHENERGAN) 25 MG tablet Take 1 tablet (25 mg total) by mouth every 8 (eight) hours as needed. for nausea 40 tablet 5  . Tiotropium Bromide Monohydrate (SPIRIVA RESPIMAT) 1.25 MCG/ACT AERS Inhale 2 puffs into the lungs daily. 1 Inhaler 5  . tiZANidine (ZANAFLEX) 4 MG tablet TAKE ONE TABLET BY MOUTH THREE TIMES DAILY AS NEEDED FOR MUSCLE SPASM 90 tablet 1  . umeclidinium bromide (INCRUSE ELLIPTA) 62.5 MCG/INH AEPB Inhale 1 puff into the lungs daily. 1 each 5  . zolpidem (AMBIEN) 5 MG tablet TAKE ONE TABLET BY MOUTH AT BEDTIME AS NEEDED 30 tablet 2   No facility-administered medications prior to visit.     Allergies  Allergen Reactions  . Dabigatran Etexilate Mesylate Other (See Comments)    INTERNAL BLEEDING-pradaxa  . Talwin [Pentazocine] Other (See Comments)    hallucinations  . Pentazocine Lactate Other (See Comments)     HALLUCINATIONS  . Lactate Rash  . Sulfonamide Derivatives Other (See Comments)    JITTERINESS     ROS     Objective:    Physical Exam  BP (!) 148/78 (BP Location: Left Arm, Cuff Size: Normal)   Pulse 79   Temp 98.1 F (36.7 C) (Oral)   Resp 16   Ht 5' (1.524 m)   Wt 102 lb 6.4 oz (46.4 kg)   SpO2 97%   BMI 20.00 kg/m  Wt Readings from Last 3 Encounters:  07/20/16 102 lb 6.4 oz (46.4 kg)  07/17/16 103 lb 9.6 oz (47 kg)  06/25/16 104 lb 3.2 oz (47.3 kg)     Lab Results  Component Value Date   WBC 8.5 02/21/2016   HGB 11.0 (L) 02/21/2016   HCT 33.8 (L) 02/21/2016   PLT 383.0 02/21/2016   GLUCOSE 79 03/16/2016   CHOL 175 09/06/2015   TRIG 125.0 09/06/2015   HDL 38.40 (L) 09/06/2015   LDLCALC 112 (H) 09/06/2015   ALT 11 09/06/2015   AST 16 09/06/2015   NA 140 03/16/2016   K 4.5 03/16/2016   CL 97 (L) 03/16/2016   CREATININE 0.91 03/16/2016   BUN 18 03/16/2016   CO2 25 03/16/2016   TSH 4.43 06/26/2010   INR 1.12 05/20/2014   HGBA1C  08/17/2007    5.6 (NOTE)   The ADA recommends the following therapeutic goals for glycemic   control related to Hgb A1C measurement:   Goal of Therapy:   < 7.0% Hgb A1C   Action Suggested:  > 8.0% Hgb A1C   Ref:  Diabetes Care, 22, Suppl. 1, 1999    Lab Results  Component Value Date  TSH 4.43 06/26/2010   Lab Results  Component Value Date   WBC 8.5 02/21/2016   HGB 11.0 (L) 02/21/2016   HCT 33.8 (L) 02/21/2016   MCV 88.7 02/21/2016   PLT 383.0 02/21/2016   Lab Results  Component Value Date   NA 140 03/16/2016   K 4.5 03/16/2016   CO2 25 03/16/2016   GLUCOSE 79 03/16/2016   BUN 18 03/16/2016   CREATININE 0.91 03/16/2016   BILITOT 0.3 09/06/2015   ALKPHOS 83 09/06/2015   AST 16 09/06/2015   ALT 11 09/06/2015   PROT 7.4 09/06/2015   ALBUMIN 4.1 09/06/2015   CALCIUM 9.8 03/16/2016   ANIONGAP 4 (L) 05/23/2014   GFR 64.80 09/06/2015   Lab Results  Component Value Date   CHOL 175 09/06/2015   Lab Results    Component Value Date   HDL 38.40 (L) 09/06/2015   Lab Results  Component Value Date   LDLCALC 112 (H) 09/06/2015   Lab Results  Component Value Date   TRIG 125.0 09/06/2015   Lab Results  Component Value Date   CHOLHDL 5 09/06/2015   Lab Results  Component Value Date   HGBA1C  08/17/2007    5.6 (NOTE)   The ADA recommends the following therapeutic goals for glycemic   control related to Hgb A1C measurement:   Goal of Therapy:   < 7.0% Hgb A1C   Action Suggested:  > 8.0% Hgb A1C   Ref:  Diabetes Care, 22, Suppl. 1, 1999       Assessment & Plan:   Problem List Items Addressed This Visit    None      I am having [name] maintain her MYRBETRIQ, AMBULATORY NON FORMULARY MEDICATION, calcium carbonate, folic acid, methotrexate, denosumab, furosemide, gabapentin, carvedilol, promethazine, umeclidinium bromide, Tiotropium Bromide Monohydrate, tiZANidine, zolpidem, pantoprazole, nitroGLYCERIN, oxyCODONE-acetaminophen, and amoxicillin-clavulanate.  No orders of the defined types were placed in this encounter.   {PROVIDER TO DELETE} Jerene Dilling, CMA

## 2016-07-23 ENCOUNTER — Other Ambulatory Visit: Payer: Self-pay | Admitting: Internal Medicine

## 2016-07-25 ENCOUNTER — Ambulatory Visit (INDEPENDENT_AMBULATORY_CARE_PROVIDER_SITE_OTHER): Payer: PPO

## 2016-07-25 ENCOUNTER — Ambulatory Visit (INDEPENDENT_AMBULATORY_CARE_PROVIDER_SITE_OTHER): Payer: PPO | Admitting: Family

## 2016-07-25 ENCOUNTER — Encounter (INDEPENDENT_AMBULATORY_CARE_PROVIDER_SITE_OTHER): Payer: Self-pay | Admitting: Family

## 2016-07-25 VITALS — Ht 60.0 in | Wt 102.0 lb

## 2016-07-25 DIAGNOSIS — M545 Low back pain, unspecified: Secondary | ICD-10-CM

## 2016-07-25 MED ORDER — PREDNISONE 10 MG PO TABS: ORAL_TABLET | ORAL | 0 refills | Status: DC

## 2016-07-27 NOTE — Progress Notes (Signed)
Office Visit Note   Patient: [name]           Date of Birth: January 09, 1946           MRN: [id number] Visit Date: 07/25/2016              Requested by: [address] [place], [place] [address] [place], [place] [address] PCP: Carollee Herter, Alferd Apa, DO  Chief Complaint  Patient presents with  . Left Hip - Pain      HPI: The patient is a 71 year old woman who presented complaining of left "hip pain". States is been ongoing since Sunday. No known injury. States she just woke up with pain is having difficulty with weightbearing on the left. States the pain is intermittent. Points to her buttock posteriorly. Complaining of him radicular symptoms down her posterior thigh. Denies any weakness or groin pain associated with this however does have numbness off and on this radiates down to her knee. States does have some baseline numbness in her bilateral feet.  Assessment & Plan: Visit Diagnoses:  1. Acute left-sided low back pain without sciatica     Plan: Him will place her on prednisone taper. She'll follow-up in office in 4 weeks. If having continued issues we'll refer her to Dr. Ernestina Patches for possibility of epidural steroid injection.  Follow-Up Instructions: Return in about 4 weeks (around 08/22/2016).   Ortho Exam  Patient is alert, oriented, no adenopathy, well-dressed, normal affect, normal respiratory effort. Him no spinous process tenderness. Does have some sciatic notch tenderness on the left. There is a positive straight leg raise on the left. Negative on the right. Reflexes and strength equal bilateral LE.  Imaging: No results found.  Labs: Lab Results  Component Value Date   HGBA1C  08/17/2007    5.6 (NOTE)   The ADA recommends the following therapeutic goals for glycemic   control related to Hgb A1C measurement:   Goal of Therapy:   < 7.0% Hgb A1C   Action Suggested:  > 8.0% Hgb A1C   Ref:  Diabetes Care, 22, Suppl. 1, 1999   ESRSEDRATE 14 06/28/2008   ESRSEDRATE 94 (H) 08/16/2007   REPTSTATUS 05/24/2014 FINAL 05/18/2014   GRAMSTAIN No WBC Seen 07/20/2013   GRAMSTAIN No Squamous Epithelial Cells Seen 07/20/2013   GRAMSTAIN Rare Gram Positive Cocci In Chains 07/20/2013   GRAMSTAIN Rare Gram Positive Rods 07/20/2013   GRAMSTAIN Rare Yeast 07/20/2013   CULT  05/18/2014    NO GROWTH 5 DAYS Performed at [facility] 09/06/2015    Orders:  Orders Placed This Encounter  Procedures  . XR Lumbar Spine 2-3 Views   Meds ordered this encounter  Medications  . predniSONE (DELTASONE) 10 MG tablet    Sig: 6 tablets for 2 days, then 5 for 2 days, then 4 for 2 days, then 3  for 2 days, then 2 for 2 days, then 1 tablet for 2 days    Dispense:  48 tablet    Refill:  0     Procedures: No procedures performed  Clinical Data: No additional findings.  ROS:  All other systems negative, except as noted in the HPI. Review of Systems  Constitutional: Negative for chills and fever.  Musculoskeletal: Positive for back pain.    Objective: Vital Signs: Ht 5' (1.524 m)   Wt 102 lb (46.3 kg)   BMI 19.92 kg/m   Specialty Comments:  No specialty  comments available.  PMFS History: Patient Active Problem List   Diagnosis Date Noted  . Right otitis media 07/17/2016  . Cerumen impaction 07/17/2016  . Osteoporosis 03/16/2016  . Headache 11/20/2014  . Skin infection 05/27/2014  . Insomnia 05/27/2014  . Abdominal wall pain 07/09/2013  . Other dysphagia 02/02/2013  . Platelets decreased ([place]) 01/29/2012  . Vitamin D deficiency 12/13/2011  . Hypokalemia 12/13/2011  . Hypocalcemia 12/12/2011  . Adhesions of intestine, very dense, with frozen abdomen 12/11/2011  . SBO (small bowel obstruction), recurrent 12/11/2011  . Protein-calorie malnutrition, moderate ([place]) 12/11/2011  . Oxygen dependent   . Hx of gastric ulcer   . UTI (urinary tract infection) 10/15/2011  . Allergic conjunctivitis 10/15/2011    . Degenerative arthritis of hip 08/03/2011  . B12 deficiency 07/02/2011  . Bilateral leg weakness 01/16/2011  . Anemia 09/14/2010  . Recurrent pneumonia 08/11/2010  . CRT- [employer] 06/26/2010  . DYSURIA 05/19/2010  . Chronic hypoxemic respiratory failure ([place]) 02/21/2010  . DEHYDRATION 01/06/2010  . GASTROENTERITIS 01/06/2010  . Congestive dilated cardiomyopathy ([place]) 11/17/2009  . LBBB 11/17/2009  . SYSTOLIC HEART FAILURE, CHRONIC 11/17/2009  . GERD 02/17/2009  . NONSPECIFIC ABN FINDNG RAD&OTH EXAM BILARY TRCT 09/03/2008  . COPD with emphysema ([place]) 06/28/2008  . DIVERTICULOSIS, COLON 06/03/2008  . GASTRITIS 06/02/2008  . Atrial fibrillation/flutter 12/08/2007  . GASTROPARESIS 08/07/2007  . NAUSEA, CHRONIC 08/07/2007  . Abdominal pain 08/07/2007  . SYNCOPE 02/17/2007  . RESTLESS LEG SYNDROME 08/01/2006  . Chronic pain syndrome 08/01/2006  . OSTEOARTHRITIS 07/31/2006  . [place] DISEASE 07/31/2006  . FIBROMYALGIA 07/31/2006  . CHRONIC FATIGUE SYNDROME 07/31/2006  . URINARY INCONTINENCE 07/31/2006  . ANEMIA-IRON DEFICIENCY 05/01/2006  . Essential hypertension 05/01/2006   Past Medical History:  Diagnosis Date  . Anemia   . Anginal pain ([place])   . Arthritis    right hip  . Atrial fibrillation ([place])   . Blood transfusion   . CAD (coronary artery disease)    mild disease per cath in 2011  . CHF (congestive heart failure) ([place])   . Chronic abdominal pain   . Chronic nausea   . Chronic pain syndrome   . COPD (chronic obstructive pulmonary disease) ([facility])    continuous O2- 2l  . Diverticulitis   . Elbow fracture, left aug 2012  . Elevated liver enzymes   . Fibromyalgia    COUPLE OF TIMES A YEAR-PT HAS EPISODES OF CONFUSION-USUALLY INVOLVES DAY/NIGHT REVERSAL AND EPISODES OF TWITCHING AND FALLS--SEES DR. Jacelyn Grip - NEUROLOGIST-LAST SEEN 07/10/11  . Gastroparesis   . GERD (gastroesophageal reflux disease)   . H/O: GI bleed    from Pradaxa  . Hepatitis    hx of  in high school   . HTN (hypertension)    under control; has been on med. x "years"  . Hx MRSA infection   . Hx of gastric ulcer   . Hx of stress fx aug 2011   right hip   . LBBB (left bundle branch block)   . Oxygen dependent    2 liters via nasal cannula at all times  . Pacemaker    CRT therapy; followed by Dr. Caryl Comes  . Pleural effusion     s/p right thoracentesis 03/09  . Pneumonia - 03/09, 12/09, 02/10, 06/10   MOST RECENT FEB 2013  . Primary dilated cardiomyopathy (H[facility])    EF 45 to 50% per echo in Jan 2012  . RLS (restless legs syndrome)   . Silent aspiration   .  Small bowel obstruction ([place])   . Urinary incontinence     -INTERSTIM IMPLANT NOT FUNCTIONING PER PT  . Venous embolism and thrombosis of subclavian vein ([place])    after pacemaker insertion Oct 2011  . Vitamin B12 deficiency     Family History  Problem Relation Age of Onset  . Heart disease Father   . Stroke Mother   . Heart disease Mother   . Heart disease Brother   . Breast cancer Maternal Aunt   . Heart disease Paternal Aunt   . Diabetes Paternal Grandfather   . Colon cancer Neg Hx     Past Surgical History:  Procedure Laterality Date  . APPENDECTOMY    . CARDIAC CATHETERIZATION  04/08/2006, 11/16/2009  . COLECTOMY     Intestinal resection (5times)  . COLECTOMY  02/04/2000   ex. lap., intra-abd. subtotal colectomy with ileosigmoid colon anastomosies and lysis of adhesions  . CYSTOSCOPY  11/11/2008  . CYSTOSCOPY W/ RETROGRADES  11/11/2008   right  . CYSTOSCOPY WITH INJECTION  04/30/2006   transurethral collagen injection; incision vaginal stenosis  . ESOPHAGOGASTRODUODENOSCOPY N/A 02/02/2013   Procedure: ESOPHAGOGASTRODUODENOSCOPY (EGD);  Surgeon: Lafayette Dragon, MD;  Location: [facility];  Service: Endoscopy;  Laterality: N/A;  . EXPLORATORY LAPAROTOMY  04/27/2009   lysis of adhesions, gastrostomy tube  . GASTROCUTANEOUS FISTULA CLOSURE    . INTERSTIM IMPLANT PLACEMENT  05/28/2006 - stage I   06/05/2006  - stage II  . INTERSTIM IMPLANT PLACEMENT  02/05/2012   Procedure: [name] IMPLANT FIRST STAGE;  Surgeon: Reece Packer, MD;  [facility];  Service: Urology;  Laterality: N/A;  Replacement of Interstim Lead     . INTERSTIM IMPLANT REVISION  03/06/2011   Procedure: REVISION OF [name];  Surgeon: Reece Packer, MD;  [facility];  Service: Urology;  Laterality: N/A;  Replacement of Neurostimulator  . INTERSTIM IMPLANT REVISION  10/23/2007  . PACEMAKER PLACEMENT  12/21/2009  . Peg removed     with complications  . PORT-A-CATH REMOVAL  07/27/2011   Procedure: REMOVAL PORT-A-CATH;  Surgeon: Rolm Bookbinder, MD;  Location: [facility];  Service: General;  Laterality: N/A;  . PORTACATH PLACEMENT  12/16/2009  . SAVORY DILATION N/A 02/02/2013   Procedure: SAVORY DILATION;  Surgeon: Lafayette Dragon, MD;  [facility];  Service: Endoscopy;  Laterality: N/A;  . SMALL INTESTINE SURGERY  05/20/2001   ex. lap., resection of small bowel stricture; gastrostomy; insertion central line  . TONSILLECTOMY  age 13  . TOTAL ABDOMINAL HYSTERECTOMY     complete  . TOTAL HIP ARTHROPLASTY  08/03/2011   Procedure: TOTAL HIP ARTHROPLASTY ANTERIOR APPROACH;  Surgeon: Mcarthur Rossetti, MD;  [facility];  Service: Orthopedics;  Laterality: Right;   Social History   Occupational History  . disabled Retired   Social History Main Topics  . Smoking status: Former Smoker    Packs/day: 1.00    Years: 20.00    Quit date: 03/19/1986  . Smokeless tobacco: Never Used  . Alcohol use No  . Drug use: No  . Sexual activity: Yes

## 2016-07-31 ENCOUNTER — Telehealth: Payer: Self-pay | Admitting: Pulmonary Disease

## 2016-07-31 NOTE — Telephone Encounter (Signed)
Spoke with the pt  She states she received a letter from [facility] stating that she needs o2 recert  I called [facility] and spoke with Corene Cornea to confirm  He states that due to her ins change, she needs ov with provider and o2 recert We will have to treat this as a new start  Spoke with the pt and scheduled ov with TP for 07/03/16

## 2016-08-02 ENCOUNTER — Encounter: Payer: Self-pay | Admitting: Adult Health

## 2016-08-02 ENCOUNTER — Ambulatory Visit (INDEPENDENT_AMBULATORY_CARE_PROVIDER_SITE_OTHER): Payer: PPO | Admitting: Adult Health

## 2016-08-02 DIAGNOSIS — J9611 Chronic respiratory failure with hypoxia: Secondary | ICD-10-CM | POA: Diagnosis not present

## 2016-08-02 DIAGNOSIS — J432 Centrilobular emphysema: Secondary | ICD-10-CM | POA: Diagnosis not present

## 2016-08-02 MED ORDER — TIOTROPIUM BROMIDE MONOHYDRATE 1.25 MCG/ACT IN AERS: 2.0000 | INHALATION_SPRAY | Freq: Every day | RESPIRATORY_TRACT | 0 refills | Status: DC

## 2016-08-02 NOTE — Addendum Note (Signed)
Addended by: Collier Salina on: 08/02/2016 05:02 PM   Modules accepted: Orders

## 2016-08-02 NOTE — Patient Instructions (Addendum)
Continue on Spiriva daily .  Continue on Oxygen 2l/m .  Follow up with Dr. Halford Chessman  In Feb 2019 and As needed

## 2016-08-02 NOTE — Assessment & Plan Note (Signed)
Doing well on O2  Cont on O2

## 2016-08-02 NOTE — Assessment & Plan Note (Signed)
Compensated on regimen  No flare  Cont on Spiriva .

## 2016-08-02 NOTE — Progress Notes (Signed)
I have reviewed and agree with assessment/plan.  Chesley Mires, MD [facility] Pulmonary/Critical Care 08/02/2016, 5:28 PM Pager:  [phone]

## 2016-08-02 NOTE — Progress Notes (Signed)
'@Patient'  ID: [name], female    DOB: 1945-12-26, 71 y.o.   MRN: [id number]  Chief Complaint  Patient presents with  . Follow-up    COPD     Referring provider: Ann Held, *  HPI: [name] is a 71 y.o. female former smoker with COPD/emphysema, chronic hypoxic respiratory failure, and recurrent pneumonia with focal bronchiectasis. Psorasis on MTX   TEST  PFT 06/28/08 >> FEV1 1.95 (97%), FEV1% 70, TLC 3.88 (87%), DLCO 64% ONO 02/11/10 >> Test time 9 hrs 9 min, Mean SpO2 87%, low SpO2 80%, Spent 5hrs 57 min (65%) with SpO2 < 88%. PFT 10/10/11 >> FEV1 1.15 (59%), FEV1% 64, TLC 3.26 (73%), DLCO 35%, + BD CT chest 04/30/2011 >> Chronic multifocal nodular and patchy airspace densities throughout the right lung with mild BTX  Cardiac tests Echo 05/18/14 >> EF 60 to 41%, grade 1 diastolic dysfx, PAS 30 mmHg  08/02/2016 Follow up : COPD /O2 RF /Bronchiectasis  Pt returns for 3 month follow up . She says she is doing very well with no flare of cough or wheezing . Remains on Spriiva . [name] Eating well . No nv/d. Or wt loss.  Going on vacation next week to [place].  On O2 at 2l/m Walk test in office with O2 sats 85% on ra . O2 at 2l/m 95% .  Needs oxygen qualification for DME/insurance     Allergies  Allergen Reactions  . Dabigatran Etexilate Mesylate Other (See Comments)    INTERNAL BLEEDING-pradaxa  . Talwin [Pentazocine] Other (See Comments)    hallucinations  . Pentazocine Lactate Other (See Comments)    HALLUCINATIONS  . Lactate Rash  . Sulfonamide Derivatives Other (See Comments)    JITTERINESS     Immunization History  Administered Date(s) Administered  . DTaP 11/03/1996, 01/04/1997, 05/03/1997, 10/19/1997, 08/05/2001  . HPV Quadrivalent 09/05/2009, 09/13/2010, 09/26/2011  . Hepatitis A, Ped/Adol-2 Dose 09/21/2004, 03/29/2005  . Hepatitis B, ped/adol 04/30/1996, 11/03/1996, 10/19/1997  . HiB (PRP-T) 11/03/1996, 01/04/1997, 05/03/1997, 10/19/1997  .  IPV 11/03/1996, 01/04/1997, 05/03/1997, 08/05/2001  . Influenza Split 01/16/2011, 12/12/2011  . Influenza Whole 01/15/2007, 12/08/2007, 12/28/2008, 12/17/2009  . Influenza, High Dose Seasonal PF 11/28/2015  . Influenza, Seasonal, Injecte, Preservative Fre 12/09/2012  . Influenza,Quad,Nasal, Live 03/17/2007, 03/06/2009, 01/08/2010, 01/30/2011, 02/09/2012, 01/18/2013  . Influenza,inj,Quad PF,36+ Mos 03/29/2005, 04/30/2005, 03/01/2006, 01/28/2008, 12/01/2013, 01/16/2014, 12/22/2014, 12/28/2014  . MMR 05/03/1997, 08/05/2001  . Meningococcal B, OMV 12/22/2014  . Pneumococcal Conjugate-13 06/10/2014, 11/15/2014  . Pneumococcal Polysaccharide-23 12/17/2009  . Tdap 09/17/2007, 05/15/2014  . Varicella 11/16/1997, 05/18/2008  . Zoster 12/31/2013    Past Medical History:  Diagnosis Date  . Anemia   . Anginal pain ([place])   . Arthritis    right hip  . Atrial fibrillation ([place])   . Blood transfusion   . CAD (coronary artery disease)    mild disease per cath in 2011  . CHF (congestive heart failure) ([place])   . Chronic abdominal pain   . Chronic nausea   . Chronic pain syndrome   . COPD (chronic obstructive pulmonary disease) ([facility])    continuous O2- 2l  . Diverticulitis   . Elbow fracture, left aug 2012  . Elevated liver enzymes   . Fibromyalgia    COUPLE OF TIMES A YEAR-PT HAS EPISODES OF CONFUSION-USUALLY INVOLVES DAY/NIGHT REVERSAL AND EPISODES OF TWITCHING AND FALLS--SEES DR. Jacelyn Grip - NEUROLOGIST-LAST SEEN 07/10/11  . Gastroparesis   . GERD (gastroesophageal reflux disease)   . H/O: GI  bleed    from Pradaxa  . Hepatitis    hx of in high school   . HTN (hypertension)    under control; has been on med. x "years"  . Hx MRSA infection   . Hx of gastric ulcer   . Hx of stress fx aug 2011   right hip   . LBBB (left bundle branch block)   . Oxygen dependent    2 liters via nasal cannula at all times  . Pacemaker    CRT therapy; followed by Dr. Caryl Comes  . Pleural effusion     s/p right  thoracentesis 03/09  . Pneumonia - 03/09, 12/09, 02/10, 06/10   MOST RECENT FEB 2013  . Primary dilated cardiomyopathy ([facility])    EF 45 to 50% per echo in Jan 2012  . RLS (restless legs syndrome)   . Silent aspiration   . Small bowel obstruction ([place])   . Urinary incontinence     -INTERSTIM IMPLANT NOT FUNCTIONING PER PT  . Venous embolism and thrombosis of subclavian vein ([place])    after pacemaker insertion Oct 2011  . Vitamin B12 deficiency     Tobacco History: History  Smoking Status  . Former Smoker  . Packs/day: 1.00  . Years: 20.00  . Quit date: 03/19/1986  Smokeless Tobacco  . Never Used   Counseling given: Not Answered   Outpatient Encounter Prescriptions as of 08/02/2016  Medication Sig  . AMBULATORY NON FORMULARY MEDICATION Pt takes Oxygen, 2 units, every day  . calcium carbonate (OS-CAL - DOSED IN MG OF ELEMENTAL CALCIUM) 1250 (500 CA) MG tablet Take 2 tablets (1,000 mg of elemental calcium total) by mouth 3 (three) times daily with meals.  . carvedilol (COREG) 25 MG tablet Take 1 tablet (25 mg total) by mouth 2 (two) times daily.  [name] denosumab (PROLIA) 60 MG/ML SOLN injection Inject 60 mg into the skin once. Administer in upper arm, thigh, or abdomen (Patient taking differently: Inject 60 mg into the skin every 6 (six) months. Administer in upper arm, thigh, or abdomen)  . folic acid (FOLVITE) 1 MG tablet Take 1 mg by mouth daily.  . furosemide (LASIX) 40 MG tablet TAKE ONE TABLET BY MOUTH IN THE MORNING  . gabapentin (NEURONTIN) 600 MG tablet Take 1 tablet (600 mg total) by mouth 4 (four) times daily. (Patient taking differently: Take 600 mg by mouth 4 (four) times daily. As needed)  . methotrexate 2.5 MG tablet Take 5 mg by mouth once a week.   [name] MYRBETRIQ 25 MG TB24 tablet Take 1 tablet by mouth daily.  . nitroGLYCERIN (NITROSTAT) 0.4 MG SL tablet Place 1 tablet (0.4 mg total) under the tongue every 5 (five) minutes as needed. CHEST PAIN  . oxyCODONE-acetaminophen  (PERCOCET) 10-325 MG tablet Take 1 tablet by mouth every 4 (four) hours as needed.  . pantoprazole (PROTONIX) 40 MG tablet TAKE 1 TABLET BY MOUTH TWICE DAILY  . promethazine (PHENERGAN) 25 MG tablet Take 1 tablet (25 mg total) by mouth every 8 (eight) hours as needed. for nausea  . Tiotropium Bromide Monohydrate (SPIRIVA RESPIMAT) 1.25 MCG/ACT AERS Inhale 2 puffs into the lungs daily.  [name] tiZANidine (ZANAFLEX) 4 MG tablet TAKE ONE TABLET BY MOUTH THREE TIMES DAILY AS NEEDED FOR MUSCLE SPASM  . umeclidinium bromide (INCRUSE ELLIPTA) 62.5 MCG/INH AEPB Inhale 1 puff into the lungs daily.  [name] zolpidem (AMBIEN) 5 MG tablet TAKE ONE TABLET BY MOUTH AT BEDTIME AS NEEDED  . [DISCONTINUED] amoxicillin-clavulanate (AUGMENTIN) 875-125  MG tablet Take 1 tablet by mouth 2 (two) times daily. For 10 days (Patient not taking: Reported on 08/02/2016)  . [DISCONTINUED] predniSONE (DELTASONE) 10 MG tablet 6 tablets for 2 days, then 5 for 2 days, then 4 for 2 days, then 3  for 2 days, then 2 for 2 days, then 1 tablet for 2 days (Patient not taking: Reported on 08/02/2016)   No facility-administered encounter medications on file as of 08/02/2016.      Review of Systems  Constitutional:   No  weight loss, night sweats,  Fevers, chills,  +fatigue, or  lassitude.  HEENT:   No headaches,  Difficulty swallowing,  Tooth/dental problems, or  Sore throat,                No sneezing, itching, ear ache, nasal congestion, post nasal drip,   CV:  No chest pain,  Orthopnea, PND, swelling in lower extremities, anasarca, dizziness, palpitations, syncope.   GI  No heartburn, indigestion, abdominal pain, nausea, vomiting, diarrhea, change in bowel habits, loss of appetite, bloody stools.   Resp:    No chest wall deformity  Skin: no rash or lesions.  GU: no dysuria, change in color of urine, no urgency or frequency.  No flank pain, no hematuria   MS:  No joint pain or swelling.  No decreased range of motion.  No back  pain.    Physical Exam  BP 126/64 (BP Location: Left Arm, Cuff Size: Normal)   Pulse 83   Ht 5' (1.524 m)   Wt 103 lb 12.8 oz (47.1 kg)   SpO2 97%   BMI 20.27 kg/m   GEN: A/Ox3; pleasant , NAD, eldelry on o2    HEENT:  [place]/AT,  EACs-clear, TMs-wnl, NOSE-clear, THROAT-clear, no lesions, no postnasal drip or exudate noted.   NECK:  Supple w/ fair ROM; no JVD; normal carotid impulses w/o bruits; no thyromegaly or nodules palpated; no lymphadenopathy.    RESP decreased BS in bases  no accessory muscle use, no dullness to percussion  CARD:  RRR, no m/r/g, no peripheral edema, pulses intact, no cyanosis or clubbing.  GI:   Soft & nt; nml bowel sounds; no organomegaly or masses detected.   Musco: Warm bil, no deformities or joint swelling noted.   Neuro: alert, no focal deficits noted.    Skin: Warm, no lesions or rashes    Lab Results:   BNP No results found for: BNP  ProBNP  Imaging: Xr Lumbar Spine 2-3 Views  Result Date: 07/27/2016 Radiographs of the lumbar spine show widespread degenerative changes. There are calcifications of the aorta. Does have spinal below throughout. There is spondylolisthesis of L4.    Assessment & Plan:   COPD with emphysema Compensated on regimen  No flare  Cont on Spiriva .   Chronic hypoxemic respiratory failure Doing well on O2  Cont on O2      Tammy Parrett, NP 08/02/2016

## 2016-08-08 ENCOUNTER — Telehealth: Payer: Self-pay | Admitting: Family Medicine

## 2016-08-08 NOTE — Telephone Encounter (Signed)
Called patient to schedule awv. Lvm for patient to call office to schedule appt.  °

## 2016-08-10 DIAGNOSIS — I4891 Unspecified atrial fibrillation: Secondary | ICD-10-CM | POA: Diagnosis not present

## 2016-08-10 DIAGNOSIS — J449 Chronic obstructive pulmonary disease, unspecified: Secondary | ICD-10-CM | POA: Diagnosis not present

## 2016-08-15 ENCOUNTER — Other Ambulatory Visit: Payer: Self-pay | Admitting: Family Medicine

## 2016-08-15 DIAGNOSIS — R52 Pain, unspecified: Secondary | ICD-10-CM

## 2016-08-15 NOTE — Telephone Encounter (Signed)
Last written 07/13/16 Last filled at pharmacy via report 07/17/16 Last ov 07/20/16 Next ov 09/06/16 Next UDS due on 09/06/16 Contract needs to printed and patient needs to sign  Control substance report on your computer

## 2016-08-15 NOTE — Telephone Encounter (Signed)
[id number]

## 2016-08-15 NOTE — Telephone Encounter (Signed)
Relation to Z[facility]:HYIF Call back number: [phone]   Reason for call:  Patient requesting a refill oxyCODONE-acetaminophen (PERCOCET) 10-325 MG tablet

## 2016-08-16 MED ORDER — OXYCODONE-ACETAMINOPHEN 10-325 MG PO TABS: 1.0000 | ORAL_TABLET | ORAL | 0 refills | Status: DC | PRN

## 2016-08-16 NOTE — Telephone Encounter (Signed)
Called informed the patient hardcopy is ready for pickup at the front desk.

## 2016-08-21 ENCOUNTER — Other Ambulatory Visit: Payer: Self-pay | Admitting: Internal Medicine

## 2016-08-24 ENCOUNTER — Ambulatory Visit (INDEPENDENT_AMBULATORY_CARE_PROVIDER_SITE_OTHER): Payer: PPO | Admitting: Family

## 2016-09-06 ENCOUNTER — Encounter: Payer: Self-pay | Admitting: Family Medicine

## 2016-09-06 ENCOUNTER — Ambulatory Visit (INDEPENDENT_AMBULATORY_CARE_PROVIDER_SITE_OTHER): Payer: PPO | Admitting: Family Medicine

## 2016-09-06 VITALS — BP 158/78 | HR 72 | Temp 98.0°F | Resp 14 | Ht 60.0 in | Wt 99.4 lb

## 2016-09-06 DIAGNOSIS — K56609 Unspecified intestinal obstruction, unspecified as to partial versus complete obstruction: Secondary | ICD-10-CM

## 2016-09-06 DIAGNOSIS — R52 Pain, unspecified: Secondary | ICD-10-CM

## 2016-09-06 DIAGNOSIS — L400 Psoriasis vulgaris: Secondary | ICD-10-CM | POA: Diagnosis not present

## 2016-09-06 DIAGNOSIS — K3184 Gastroparesis: Secondary | ICD-10-CM | POA: Diagnosis not present

## 2016-09-06 DIAGNOSIS — E785 Hyperlipidemia, unspecified: Secondary | ICD-10-CM | POA: Diagnosis not present

## 2016-09-06 DIAGNOSIS — I10 Essential (primary) hypertension: Secondary | ICD-10-CM | POA: Diagnosis not present

## 2016-09-06 DIAGNOSIS — G4700 Insomnia, unspecified: Secondary | ICD-10-CM

## 2016-09-06 DIAGNOSIS — Z79891 Long term (current) use of opiate analgesic: Secondary | ICD-10-CM | POA: Diagnosis not present

## 2016-09-06 DIAGNOSIS — Z79899 Other long term (current) drug therapy: Secondary | ICD-10-CM | POA: Diagnosis not present

## 2016-09-06 MED ORDER — TIZANIDINE HCL 4 MG PO TABS: 4.0000 mg | ORAL_TABLET | Freq: Three times a day (TID) | ORAL | 1 refills | Status: DC | PRN

## 2016-09-06 MED ORDER — ZOLPIDEM TARTRATE 5 MG PO TABS: 5.0000 mg | ORAL_TABLET | Freq: Every evening | ORAL | 2 refills | Status: DC | PRN

## 2016-09-06 MED ORDER — OXYCODONE-ACETAMINOPHEN 10-325 MG PO TABS: 1.0000 | ORAL_TABLET | ORAL | 0 refills | Status: DC | PRN

## 2016-09-06 NOTE — Assessment & Plan Note (Signed)
Well controlled, no changes to meds. Encouraged heart healthy diet such as the DASH diet and exercise as tolerated. ---slightly elevated today Runs lower at home

## 2016-09-06 NOTE — Patient Instructions (Addendum)
Cholesterol Cholesterol is a white, waxy, fat-like substance that is needed by the human body in small amounts. The liver makes all the cholesterol we need. Cholesterol is carried from the liver by the blood through the blood vessels. Deposits of cholesterol (plaques) may build up on blood vessel (artery) walls. Plaques make the arteries narrower and stiffer. Cholesterol plaques increase the risk for heart attack and stroke. You cannot feel your cholesterol level even if it is very high. The only way to know that it is high is to have a blood test. Once you know your cholesterol levels, you should keep a record of the test results. Work with your health care provider to keep your levels in the desired range. What do the results mean?  Total cholesterol is a rough measure of all the cholesterol in your blood.medicare wellness   LDL (low-density lipoprotein) is the "bad" cholesterol. This is the type that causes plaque to build up on the artery walls. You want this level to be low.  HDL (high-density lipoprotein) is the "good" cholesterol because it cleans the arteries and carries the LDL away. You want this level to be high.  Triglycerides are fat that the body can either burn for energy or store. High levels are closely linked to heart disease. What are the desired levels of cholesterol?  Total cholesterol below 200.  LDL below 100 for people who are at risk, below 70 for people at very high risk.  HDL above 40 is good. A level of 60 or higher is considered to be protective against heart disease.  Triglycerides below 150. How can I lower my cholesterol? Diet Follow your diet program as told by your health care provider.  Choose fish or white meat chicken and [place], roasted or baked. Limit fatty cuts of red meat, fried foods, and processed meats, such as sausage and lunch meats.  Eat lots of fresh fruits and vegetables.  Choose whole grains, beans, pasta, potatoes, and cereals.  Choose  olive oil, corn oil, or canola oil, and use only small amounts.  Avoid butter, mayonnaise, shortening, or palm kernel oils.  Avoid foods with trans fats.  Drink skim or nonfat milk and eat low-fat or nonfat yogurt and cheeses. Avoid whole milk, cream, ice cream, egg yolks, and full-fat cheeses.  Healthier desserts include angel food cake, ginger snaps, animal crackers, hard candy, popsicles, and low-fat or nonfat frozen yogurt. Avoid pastries, cakes, pies, and cookies.  Exercise  Follow your exercise program as told by your health care provider. A regular program: ? Helps to decrease LDL and raise HDL. ? Helps with weight control.  Do things that increase your activity level, such as gardening, walking, and taking the stairs.  Ask your health care provider about ways that you can be more active in your daily life.  Medicine  Take over-the-counter and prescription medicines only as told by your health care provider. ? Medicine may be prescribed by your health care provider to help lower cholesterol and decrease the risk for heart disease. This is usually done if diet and exercise have failed to bring down cholesterol levels. ? If you have several risk factors, you may need medicine even if your levels are normal.  This information is not intended to replace advice given to you by your health care provider. Make sure you discuss any questions you have with your health care provider. Document Released: 11/28/2000 Document Revised: 10/01/2015 Document Reviewed: 09/03/2015 Elsevier Interactive Patient Education  2017 [employer].

## 2016-09-06 NOTE — Progress Notes (Signed)
Patient ID: [name], female    DOB: 1945-07-05  Age: 71 y.o. MRN: [id number]    Subjective:  Subjective  HPI [name] presents for f/u cholesterol , pain meds and is requesting labs for derm.    Review of Systems  Constitutional: Negative for appetite change, diaphoresis, fatigue, fever and unexpected weight change.  HENT: Negative for congestion.   Eyes: Negative for pain, redness and visual disturbance.  Respiratory: Negative for cough, chest tightness, shortness of breath and wheezing.   Cardiovascular: Negative for chest pain, palpitations and leg swelling.  Gastrointestinal: Negative for abdominal pain, blood in stool and nausea.  Endocrine: Negative for cold intolerance, heat intolerance, polydipsia, polyphagia and polyuria.  Genitourinary: Negative for difficulty urinating, dysuria and frequency.  Skin: Negative for rash.  Allergic/Immunologic: Negative for environmental allergies.  Neurological: Negative for dizziness, light-headedness, numbness and headaches.  Psychiatric/Behavioral: The patient is not nervous/anxious.     History Past Medical History:  Diagnosis Date  . Anemia   . Anginal pain ([place])   . Arthritis    right hip  . Atrial fibrillation ([place])   . Blood transfusion   . CAD (coronary artery disease)    mild disease per cath in 2011  . CHF (congestive heart failure) ([place])   . Chronic abdominal pain   . Chronic nausea   . Chronic pain syndrome   . COPD (chronic obstructive pulmonary disease) ([facility])    continuous O2- 2l  . Diverticulitis   . Elbow fracture, left aug 2012  . Elevated liver enzymes   . Fibromyalgia    COUPLE OF TIMES A YEAR-PT HAS EPISODES OF CONFUSION-USUALLY INVOLVES DAY/NIGHT REVERSAL AND EPISODES OF TWITCHING AND FALLS--SEES DR. Jacelyn Grip - NEUROLOGIST-LAST SEEN 07/10/11  . Gastroparesis   . GERD (gastroesophageal reflux disease)   . H/O: GI bleed    from Pradaxa  . Hepatitis    hx of in high school   . HTN (hypertension)      under control; has been on med. x "years"  . Hx MRSA infection   . Hx of gastric ulcer   . Hx of stress fx aug 2011   right hip   . LBBB (left bundle branch block)   . Oxygen dependent    2 liters via nasal cannula at all times  . Pacemaker    CRT therapy; followed by Dr. Caryl Comes  . Pleural effusion     s/p right thoracentesis 03/09  . Pneumonia - 03/09, 12/09, 02/10, 06/10   MOST RECENT FEB 2013  . Primary dilated cardiomyopathy (H[facility])    EF 45 to 50% per echo in Jan 2012  . RLS (restless legs syndrome)   . Silent aspiration   . Small bowel obstruction ([place])   . Urinary incontinence     -INTERSTIM IMPLANT NOT FUNCTIONING PER PT  . Venous embolism and thrombosis of subclavian vein ([place])    after pacemaker insertion Oct 2011  . Vitamin B12 deficiency     She has a past surgical history that includes Appendectomy; pacemaker placement (12/21/2009); Peg removed; Tonsillectomy (age 23); Interstim implant revision (03/06/2011); Portacath placement (12/16/2009); Total abdominal hysterectomy; Interstim Implant placement (05/28/2006 - stage I); Interstim implant revision (10/23/2007); Cystoscopy with injection (04/30/2006); Small intestine surgery (05/20/2001); Cardiac catheterization (04/08/2006, 11/16/2009); Cystoscopy (11/11/2008); Cystoscopy w/ retrogrades (11/11/2008); Colectomy; Colectomy (02/04/2000); Gastrocutaneous fistula closure; Exploratory laparotomy (04/27/2009); Port-a-cath removal (07/27/2011); Total hip arthroplasty (08/03/2011); Interstim Implant placement (02/05/2012); Esophagogastroduodenoscopy (N/A, 02/02/2013); and Savory dilation (N/A, 02/02/2013).   Her  family history includes Breast cancer in her maternal aunt; Diabetes in her paternal grandfather; Heart disease in her brother, father, mother, and paternal aunt; Stroke in her mother.She reports that she quit smoking about 30 years ago. She has a 20.00 pack-year smoking history. She has never used smokeless tobacco. She reports that she  does not drink alcohol or use drugs.  Current Outpatient Prescriptions on File Prior to Visit  Medication Sig Dispense Refill  . AMBULATORY NON FORMULARY MEDICATION Pt takes Oxygen, 2 units, every day    . calcium carbonate (OS-CAL - DOSED IN MG OF ELEMENTAL CALCIUM) 1250 (500 CA) MG tablet Take 2 tablets (1,000 mg of elemental calcium total) by mouth 3 (three) times daily with meals. 180 tablet 0  . carvedilol (COREG) 25 MG tablet Take 1 tablet (25 mg total) by mouth 2 (two) times daily. 180 tablet 2  . denosumab (PROLIA) 60 MG/ML SOLN injection Inject 60 mg into the skin once. Administer in upper arm, thigh, or abdomen (Patient taking differently: Inject 60 mg into the skin every 6 (six) months. Administer in upper arm, thigh, or abdomen) 1.8 mL   . folic acid (FOLVITE) 1 MG tablet Take 1 mg by mouth daily.    . furosemide (LASIX) 40 MG tablet TAKE ONE TABLET BY MOUTH IN THE MORNING 90 tablet 3  . gabapentin (NEURONTIN) 600 MG tablet Take 1 tablet (600 mg total) by mouth 4 (four) times daily. (Patient taking differently: Take 600 mg by mouth 4 (four) times daily. As needed) 120 tablet 11  . methotrexate 2.5 MG tablet Take 5 mg by mouth once a week.     [name] MYRBETRIQ 25 MG TB24 tablet Take 1 tablet by mouth daily.    . nitroGLYCERIN (NITROSTAT) 0.4 MG SL tablet Place 1 tablet (0.4 mg total) under the tongue every 5 (five) minutes as needed. CHEST PAIN 25 tablet 1  . pantoprazole (PROTONIX) 40 MG tablet TAKE 1 TABLET BY MOUTH TWICE DAILY 180 tablet 1  . promethazine (PHENERGAN) 25 MG tablet Take 1 tablet (25 mg total) by mouth every 8 (eight) hours as needed. for nausea 40 tablet 5  . Tiotropium Bromide Monohydrate (SPIRIVA RESPIMAT) 1.25 MCG/ACT AERS Inhale 2 puffs into the lungs daily. 1 Inhaler 5  . Tiotropium Bromide Monohydrate (SPIRIVA RESPIMAT) 1.25 MCG/ACT AERS Inhale 2 puffs into the lungs daily. 2 Inhaler 0  . [DISCONTINUED] iron dextran complex (INFED) 50 MG/ML injection Please give infed  infusion (no test dose needed) over 4 hours per pharmacy calculated dose. Ht: 5'2 Wt: 99 pounds Hgb: 12 1 mL 0   No current facility-administered medications on file prior to visit.      Objective:  Objective  Physical Exam  Constitutional: She is oriented to person, place, and time. She appears well-developed and well-nourished.  HENT:  Head: Normocephalic and atraumatic.  Eyes: Conjunctivae and EOM are normal.  Neck: Normal range of motion. Neck supple. No JVD present. Carotid bruit is not present. No thyromegaly present.  Cardiovascular: Normal rate, regular rhythm and normal heart sounds.   No murmur heard. Pulmonary/Chest: Effort normal and breath sounds normal. No respiratory distress. She has no wheezes. She has no rales. She exhibits no tenderness.  Musculoskeletal: She exhibits no edema.  Neurological: She is alert and oriented to person, place, and time.  Psychiatric: She has a normal mood and affect. Her behavior is normal. Judgment and thought content normal.  Nursing note and vitals reviewed.  BP (!) 158/78 (BP Location: Right  Arm, Patient Position: Sitting, Cuff Size: Normal)   Pulse 72   Temp 98 F (36.7 C) (Oral)   Resp 14   Ht 5' (1.524 m)   Wt 99 lb 6.4 oz (45.1 kg)   SpO2 99%   BMI 19.41 kg/m  Wt Readings from Last 3 Encounters:  09/06/16 99 lb 6.4 oz (45.1 kg)  08/02/16 103 lb 12.8 oz (47.1 kg)  07/25/16 102 lb (46.3 kg)     Lab Results  Component Value Date   WBC 7.1 09/06/2016   HGB 11.6 (L) 09/06/2016   HCT 35.8 (L) 09/06/2016   PLT 321.0 09/06/2016   GLUCOSE 93 09/06/2016   CHOL 181 09/06/2016   TRIG 145.0 09/06/2016   HDL 40.20 09/06/2016   LDLCALC 112 (H) 09/06/2016   ALT 43 (H) 09/06/2016   AST 25 09/06/2016   NA 138 09/06/2016   K 4.6 09/06/2016   CL 98 09/06/2016   CREATININE 0.85 09/06/2016   BUN 16 09/06/2016   CO2 32 09/06/2016   TSH 4.43 06/26/2010   INR 1.12 05/20/2014   HGBA1C  08/17/2007    5.6 (NOTE)   The ADA  recommends the following therapeutic goals for glycemic   control related to Hgb A1C measurement:   Goal of Therapy:   < 7.0% Hgb A1C   Action Suggested:  > 8.0% Hgb A1C   Ref:  Diabetes Care, 22, Suppl. 1, 1999    Dg Chest 2 View  Result Date: 04/30/2016 CLINICAL DATA:  Emphysema, COPD, progressive cough EXAM: CHEST  2 VIEW COMPARISON:  05/18/2014 FINDINGS: Cardiomediastinal silhouette is stable. Three leads cardiac pacemaker is unchanged in position. Mild perihilar bronchitic changes. Hyperinflation again noted. There is streaky interstitial prominence right perihilar and right upper lobe. There is patchy airspace disease noted in right apex and right perihilar upper lobe. Superimposed infiltrate or pneumonitis is suspected. Follow-up to resolution is recommended. IMPRESSION: Mild perihilar bronchitic changes. Hyperinflation again noted. There is streaky interstitial prominence right perihilar and right upper lobe. There is patchy airspace disease noted in right apex and right perihilar upper lobe. Superimposed infiltrate or pneumonitis is suspected. Follow-up to resolution is recommended. Electronically Signed   By: Lahoma Crocker M.D.   On: 04/30/2016 12:49     Assessment & Plan:  Plan  I have changed Ms. Quaintance's tiZANidine and zolpidem. I am also having her maintain her MYRBETRIQ, AMBULATORY NON FORMULARY MEDICATION, calcium carbonate, folic acid, methotrexate, denosumab, furosemide, gabapentin, carvedilol, promethazine, Tiotropium Bromide Monohydrate, nitroGLYCERIN, Tiotropium Bromide Monohydrate, pantoprazole, and oxyCODONE-acetaminophen.  Meds ordered this encounter  Medications  . oxyCODONE-acetaminophen (PERCOCET) 10-325 MG tablet    Sig: Take 1 tablet by mouth every 4 (four) hours as needed.    Dispense:  120 tablet    Refill:  0    Do not fill until 09/18/2016  . tiZANidine (ZANAFLEX) 4 MG tablet    Sig: Take 1 tablet (4 mg total) by mouth 3 (three) times daily as needed. for muscle spams      Dispense:  90 tablet    Refill:  1    Please consider 90 day supplies to promote better adherence  . zolpidem (AMBIEN) 5 MG tablet    Sig: Take 1 tablet (5 mg total) by mouth at bedtime as needed.    Dispense:  30 tablet    Refill:  2    Problem List Items Addressed This Visit      Unprioritized   Insomnia   Relevant Medications  zolpidem (AMBIEN) 5 MG tablet   Essential hypertension    Well controlled, no changes to meds. Encouraged heart healthy diet such as the DASH diet and exercise as tolerated. ---slightly elevated today Runs lower at home      Relevant Orders   CBC with Differential/Platelet (Completed)   Comprehensive metabolic panel (Completed)   GASTROPARESIS    Per GI       SBO (small bowel obstruction), recurrent    Per GI Pt on chronic pain meds due to scar tissue and abd pain        Other Visit Diagnoses    Plaque psoriasis    -  Primary   Relevant Orders   CBC with Differential/Platelet (Completed)   Comprehensive metabolic panel (Completed)   Hyperlipidemia, unspecified hyperlipidemia type       Relevant Orders   Lipid panel (Completed)   Pain       Relevant Medications   oxyCODONE-acetaminophen (PERCOCET) 10-325 MG tablet   tiZANidine (ZANAFLEX) 4 MG tablet      Follow-up: Return in about 6 months (around 03/08/2017) for annual exam, fasting.  Ann Held, DO

## 2016-09-07 LAB — LIPID PANEL
Cholesterol: 181 mg/dL (ref 0–200)
HDL: 40.2 mg/dL (ref >39.00)
LDL Cholesterol: 112 mg/dL — ABNORMAL HIGH (ref 0–99)
NonHDL: 141.17
Total CHOL/HDL Ratio: 5
Triglycerides: 145 mg/dL (ref 0.0–149.0)
VLDL: 29 mg/dL (ref 0.0–40.0)

## 2016-09-07 LAB — CBC WITH DIFFERENTIAL/PLATELET
Basophils Absolute: 0 10*3/uL (ref 0.0–0.1)
Basophils Relative: 0.4 % (ref 0.0–3.0)
Eosinophils Absolute: 0.2 10*3/uL (ref 0.0–0.7)
Eosinophils Relative: 2.7 % (ref 0.0–5.0)
HCT: 35.8 % — ABNORMAL LOW (ref 36.0–46.0)
Hemoglobin: 11.6 g/dL — ABNORMAL LOW (ref 12.0–15.0)
Lymphocytes Relative: 5.4 % — ABNORMAL LOW (ref 12.0–46.0)
Lymphs Abs: 0.4 10*3/uL — ABNORMAL LOW (ref 0.7–4.0)
MCHC: 32.4 g/dL (ref 30.0–36.0)
MCV: 88.8 fl (ref 78.0–100.0)
Monocytes Absolute: 0.4 10*3/uL (ref 0.1–1.0)
Monocytes Relative: 5.2 % (ref 3.0–12.0)
Neutro Abs: 6.1 10*3/uL (ref 1.4–7.7)
Neutrophils Relative %: 86.3 % — ABNORMAL HIGH (ref 43.0–77.0)
Platelets: 321 10*3/uL (ref 150.0–400.0)
RBC: 4.03 Mil/uL (ref 3.87–5.11)
RDW: 15 % (ref 11.5–15.5)
WBC: 7.1 10*3/uL (ref 4.0–10.5)

## 2016-09-07 LAB — COMPREHENSIVE METABOLIC PANEL
ALT: 43 U/L — ABNORMAL HIGH (ref 0–35)
AST: 25 U/L (ref 0–37)
Albumin: 4.2 g/dL (ref 3.5–5.2)
Alkaline Phosphatase: 175 U/L — ABNORMAL HIGH (ref 39–117)
BUN: 16 mg/dL (ref 6–23)
CO2: 32 mEq/L (ref 19–32)
Calcium: 9.7 mg/dL (ref 8.4–10.5)
Chloride: 98 mEq/L (ref 96–112)
Creatinine, Ser: 0.85 mg/dL (ref 0.40–1.20)
GFR: 69.91 mL/min (ref >60.00)
Glucose, Bld: 93 mg/dL (ref 70–99)
Potassium: 4.6 mEq/L (ref 3.5–5.1)
Sodium: 138 mEq/L (ref 135–145)
Total Bilirubin: 0.4 mg/dL (ref 0.2–1.2)
Total Protein: 7.4 g/dL (ref 6.0–8.3)

## 2016-09-09 NOTE — Assessment & Plan Note (Signed)
Per GI Pt on chronic pain meds due to scar tissue and abd pain

## 2016-09-09 NOTE — Assessment & Plan Note (Signed)
Per GI

## 2016-09-10 DIAGNOSIS — J449 Chronic obstructive pulmonary disease, unspecified: Secondary | ICD-10-CM | POA: Diagnosis not present

## 2016-09-10 DIAGNOSIS — I4891 Unspecified atrial fibrillation: Secondary | ICD-10-CM | POA: Diagnosis not present

## 2016-09-13 ENCOUNTER — Other Ambulatory Visit: Payer: Self-pay | Admitting: Family Medicine

## 2016-09-13 DIAGNOSIS — R52 Pain, unspecified: Secondary | ICD-10-CM

## 2016-09-17 DIAGNOSIS — H26492 Other secondary cataract, left eye: Secondary | ICD-10-CM | POA: Diagnosis not present

## 2016-09-17 DIAGNOSIS — Z961 Presence of intraocular lens: Secondary | ICD-10-CM | POA: Diagnosis not present

## 2016-09-28 ENCOUNTER — Telehealth: Payer: Self-pay | Admitting: Family Medicine

## 2016-09-28 NOTE — Telephone Encounter (Signed)
Last CMP 03/16/2015 Last bone density 03/16/2015 Called the patient informed of results/she will call us back later to schedule nurse visit appt/she is having eye surgery And will call back once done with that to schedule/will then notify Gilmore Laroche to order her one.

## 2016-09-28 NOTE — Telephone Encounter (Signed)
Prolia benefits verified NO PA required 20% co-insurance for Admin and Prolia $10 Copay is OV charged  Patient may owe approximately $200 OOP  Due after 09/18/16

## 2016-10-03 DIAGNOSIS — H26492 Other secondary cataract, left eye: Secondary | ICD-10-CM | POA: Diagnosis not present

## 2016-10-10 DIAGNOSIS — I4891 Unspecified atrial fibrillation: Secondary | ICD-10-CM | POA: Diagnosis not present

## 2016-10-10 DIAGNOSIS — L400 Psoriasis vulgaris: Secondary | ICD-10-CM | POA: Diagnosis not present

## 2016-10-10 DIAGNOSIS — J449 Chronic obstructive pulmonary disease, unspecified: Secondary | ICD-10-CM | POA: Diagnosis not present

## 2016-10-10 DIAGNOSIS — D485 Neoplasm of uncertain behavior of skin: Secondary | ICD-10-CM | POA: Diagnosis not present

## 2016-10-15 ENCOUNTER — Ambulatory Visit (INDEPENDENT_AMBULATORY_CARE_PROVIDER_SITE_OTHER): Payer: PPO | Admitting: *Deleted

## 2016-10-15 ENCOUNTER — Telehealth: Payer: Self-pay | Admitting: Cardiology

## 2016-10-15 DIAGNOSIS — I428 Other cardiomyopathies: Secondary | ICD-10-CM | POA: Diagnosis not present

## 2016-10-15 NOTE — Telephone Encounter (Signed)
Spoke with pt and reminded pt of remote transmission that is due today. Pt verbalized understanding.   

## 2016-10-15 NOTE — Progress Notes (Signed)
Remote pacemaker transmission.   

## 2016-10-18 LAB — CUP PACEART REMOTE DEVICE CHECK
Battery Remaining Longevity: 29 mo
Battery Voltage: 2.93 V
Brady Statistic AP VP Percent: 1.61 %
Brady Statistic AP VS Percent: 0 %
Brady Statistic AS VP Percent: 98.35 %
Brady Statistic AS VS Percent: 0.04 %
Brady Statistic RA Percent Paced: 1.59 %
Brady Statistic RV Percent Paced: 99.95 %
Date Time Interrogation Session: 20180730213012
Implantable Lead Implant Date: 20111005
Implantable Lead Implant Date: 20111005
Implantable Lead Implant Date: 20111005
Implantable Lead Location: 753858
Implantable Lead Location: 753859
Implantable Lead Location: 753860
Implantable Lead Model: 4196
Implantable Lead Model: 5076
Implantable Lead Model: 5076
Implantable Pulse Generator Implant Date: 20111005
Lead Channel Impedance Value: 304 Ohm
Lead Channel Impedance Value: 342 Ohm
Lead Channel Impedance Value: 399 Ohm
Lead Channel Impedance Value: 437 Ohm
Lead Channel Impedance Value: 456 Ohm
Lead Channel Impedance Value: 475 Ohm
Lead Channel Impedance Value: 494 Ohm
Lead Channel Impedance Value: 494 Ohm
Lead Channel Impedance Value: 684 Ohm
Lead Channel Pacing Threshold Amplitude: 0.5 V
Lead Channel Pacing Threshold Amplitude: 0.5 V
Lead Channel Pacing Threshold Pulse Width: 0.4 ms
Lead Channel Pacing Threshold Pulse Width: 0.4 ms
Lead Channel Sensing Intrinsic Amplitude: 0.625 mV
Lead Channel Sensing Intrinsic Amplitude: 0.625 mV
Lead Channel Sensing Intrinsic Amplitude: 13 mV
Lead Channel Sensing Intrinsic Amplitude: 7.625 mV
Lead Channel Setting Pacing Amplitude: 1.25 V
Lead Channel Setting Pacing Amplitude: 2 V
Lead Channel Setting Pacing Amplitude: 2.5 V
Lead Channel Setting Pacing Pulse Width: 0.4 ms
Lead Channel Setting Pacing Pulse Width: 0.8 ms
Lead Channel Setting Sensing Sensitivity: 0.9 mV

## 2016-10-19 ENCOUNTER — Encounter: Payer: Self-pay | Admitting: Cardiology

## 2016-10-22 ENCOUNTER — Telehealth: Payer: Self-pay | Admitting: Family Medicine

## 2016-10-22 DIAGNOSIS — R52 Pain, unspecified: Secondary | ICD-10-CM

## 2016-10-22 MED ORDER — OXYCODONE-ACETAMINOPHEN 10-325 MG PO TABS: 1.0000 | ORAL_TABLET | ORAL | 0 refills | Status: DC | PRN

## 2016-10-22 NOTE — Telephone Encounter (Signed)
OK to refill requested med

## 2016-10-22 NOTE — Telephone Encounter (Signed)
Requesting: oxycodone Contract     03/16/2016 UDS    Low risk next is due on 03/08/2017 Last OV    6/21`/2018 Last Refill    #120 no refills on 09/06/2016  Please Advise

## 2016-10-22 NOTE — Telephone Encounter (Signed)
Caller name: Rufina  Relation to pt: self  Call back number: [phone] Pharmacy:  Reason for call: Pt requesting refill on oxyCODONE-acetaminophen (PERCOCET) 10-325 MG tablet. Please advise.

## 2016-10-22 NOTE — Telephone Encounter (Signed)
Printed/PCP signed. Called the patient informed to pickup hardcopy at the front desk.

## 2016-10-23 ENCOUNTER — Other Ambulatory Visit: Payer: Self-pay | Admitting: Family Medicine

## 2016-10-23 DIAGNOSIS — R52 Pain, unspecified: Secondary | ICD-10-CM

## 2016-11-06 ENCOUNTER — Other Ambulatory Visit: Payer: Self-pay | Admitting: *Deleted

## 2016-11-06 MED ORDER — CARVEDILOL 25 MG PO TABS: 25.0000 mg | ORAL_TABLET | Freq: Two times a day (BID) | ORAL | 0 refills | Status: DC

## 2016-11-10 DIAGNOSIS — J449 Chronic obstructive pulmonary disease, unspecified: Secondary | ICD-10-CM | POA: Diagnosis not present

## 2016-11-10 DIAGNOSIS — I4891 Unspecified atrial fibrillation: Secondary | ICD-10-CM | POA: Diagnosis not present

## 2016-11-12 ENCOUNTER — Encounter: Payer: Self-pay | Admitting: Cardiology

## 2016-11-20 DIAGNOSIS — C4401 Basal cell carcinoma of skin of lip: Secondary | ICD-10-CM | POA: Diagnosis not present

## 2016-11-27 ENCOUNTER — Other Ambulatory Visit: Payer: Self-pay | Admitting: Cardiology

## 2016-11-28 ENCOUNTER — Telehealth: Payer: Self-pay | Admitting: Family Medicine

## 2016-11-28 DIAGNOSIS — R52 Pain, unspecified: Secondary | ICD-10-CM

## 2016-11-28 NOTE — Telephone Encounter (Signed)
Hydrocodone acetometaphin 325 mg script pt req Lowne Chase give script. Call her and she will come pick it up.

## 2016-11-28 NOTE — Telephone Encounter (Signed)
Clarification: Pt would like a refill on oxycodone-acetaminophen 10-325.   Sig-Take 1 tablet by mouth every 4 hours as needed. Last filled:  10/22/16 Amt: 120,0 09/06/16 uds sample given, low risk next screen 03/08/17.  Please advise.

## 2016-11-29 ENCOUNTER — Other Ambulatory Visit: Payer: Self-pay | Admitting: Family Medicine

## 2016-11-29 DIAGNOSIS — R52 Pain, unspecified: Secondary | ICD-10-CM

## 2016-11-29 MED ORDER — OXYCODONE-ACETAMINOPHEN 10-325 MG PO TABS: 1.0000 | ORAL_TABLET | ORAL | 0 refills | Status: DC | PRN

## 2016-11-29 NOTE — Telephone Encounter (Signed)
Noted. Rx printed and forwarded to PCP for review and signature.

## 2016-11-29 NOTE — Telephone Encounter (Signed)
Refill x1 

## 2016-11-29 NOTE — Telephone Encounter (Signed)
Rx was signed and placed up front for pick up. Pt was notified as well

## 2016-12-07 ENCOUNTER — Ambulatory Visit (INDEPENDENT_AMBULATORY_CARE_PROVIDER_SITE_OTHER): Payer: PPO

## 2016-12-07 DIAGNOSIS — Z23 Encounter for immunization: Secondary | ICD-10-CM

## 2016-12-10 ENCOUNTER — Telehealth: Payer: Self-pay | Admitting: Family Medicine

## 2016-12-10 DIAGNOSIS — R945 Abnormal results of liver function studies: Principal | ICD-10-CM

## 2016-12-10 DIAGNOSIS — R7989 Other specified abnormal findings of blood chemistry: Secondary | ICD-10-CM

## 2016-12-10 NOTE — Telephone Encounter (Signed)
°  Relation to TH:YHOO Call back [place]   Reason for call:  Patient states as per 09/05/16 lab results Recheck liver function and cbcd in 3 months, patient requesting lab orders,please advise

## 2016-12-11 DIAGNOSIS — J449 Chronic obstructive pulmonary disease, unspecified: Secondary | ICD-10-CM | POA: Diagnosis not present

## 2016-12-11 DIAGNOSIS — I4891 Unspecified atrial fibrillation: Secondary | ICD-10-CM | POA: Diagnosis not present

## 2016-12-11 NOTE — Telephone Encounter (Signed)
Orders placed.

## 2016-12-11 NOTE — Telephone Encounter (Signed)
Ok to put orders in

## 2016-12-11 NOTE — Telephone Encounter (Signed)
lvm advising patient orders are placed

## 2016-12-13 ENCOUNTER — Other Ambulatory Visit: Payer: Self-pay | Admitting: Family Medicine

## 2016-12-13 DIAGNOSIS — R52 Pain, unspecified: Secondary | ICD-10-CM

## 2016-12-13 DIAGNOSIS — G4700 Insomnia, unspecified: Secondary | ICD-10-CM

## 2016-12-14 NOTE — Telephone Encounter (Signed)
Requesting:   zolpidem Contract    03/16/2016 UDS   Low risk on 09/06/2016 Last OV    09/06/2016 Last Refill   #30 with 2 refills on 09/06/2016  Tizanidine last refilled on 09/06/2016   #90 with 1 refill  Please Advise

## 2016-12-17 ENCOUNTER — Other Ambulatory Visit: Payer: Self-pay | Admitting: Family Medicine

## 2016-12-17 DIAGNOSIS — G4700 Insomnia, unspecified: Secondary | ICD-10-CM

## 2016-12-17 NOTE — Telephone Encounter (Signed)
Rx faxed to pharmacy  

## 2016-12-18 ENCOUNTER — Other Ambulatory Visit (INDEPENDENT_AMBULATORY_CARE_PROVIDER_SITE_OTHER): Payer: PPO

## 2016-12-18 DIAGNOSIS — R7989 Other specified abnormal findings of blood chemistry: Secondary | ICD-10-CM

## 2016-12-18 DIAGNOSIS — R945 Abnormal results of liver function studies: Secondary | ICD-10-CM

## 2016-12-18 LAB — CBC WITH DIFFERENTIAL/PLATELET
Basophils Absolute: 0 10*3/uL (ref 0.0–0.1)
Basophils Relative: 0.6 % (ref 0.0–3.0)
Eosinophils Absolute: 0.3 10*3/uL (ref 0.0–0.7)
Eosinophils Relative: 4.4 % (ref 0.0–5.0)
HCT: 34.5 % — ABNORMAL LOW (ref 36.0–46.0)
Hemoglobin: 11.2 g/dL — ABNORMAL LOW (ref 12.0–15.0)
Lymphocytes Relative: 6.6 % — ABNORMAL LOW (ref 12.0–46.0)
Lymphs Abs: 0.5 10*3/uL — ABNORMAL LOW (ref 0.7–4.0)
MCHC: 32.5 g/dL (ref 30.0–36.0)
MCV: 92.2 fl (ref 78.0–100.0)
Monocytes Absolute: 0.6 10*3/uL (ref 0.1–1.0)
Monocytes Relative: 8.3 % (ref 3.0–12.0)
Neutro Abs: 5.8 10*3/uL (ref 1.4–7.7)
Neutrophils Relative %: 80.1 % — ABNORMAL HIGH (ref 43.0–77.0)
Platelets: 222 10*3/uL (ref 150.0–400.0)
RBC: 3.74 Mil/uL — ABNORMAL LOW (ref 3.87–5.11)
RDW: 14.3 % (ref 11.5–15.5)
WBC: 7.2 10*3/uL (ref 4.0–10.5)

## 2016-12-18 LAB — HEPATIC FUNCTION PANEL
ALT: 13 U/L (ref 0–35)
AST: 21 U/L (ref 0–37)
Albumin: 4 g/dL (ref 3.5–5.2)
Alkaline Phosphatase: 90 U/L (ref 39–117)
Bilirubin, Direct: 0.1 mg/dL (ref 0.0–0.3)
Total Bilirubin: 0.4 mg/dL (ref 0.2–1.2)
Total Protein: 7 g/dL (ref 6.0–8.3)

## 2016-12-31 DIAGNOSIS — M79674 Pain in right toe(s): Secondary | ICD-10-CM | POA: Diagnosis not present

## 2016-12-31 DIAGNOSIS — L600 Ingrowing nail: Secondary | ICD-10-CM | POA: Diagnosis not present

## 2017-01-08 ENCOUNTER — Telehealth: Payer: Self-pay | Admitting: Family Medicine

## 2017-01-08 DIAGNOSIS — R52 Pain, unspecified: Secondary | ICD-10-CM

## 2017-01-08 MED ORDER — OXYCODONE-ACETAMINOPHEN 10-325 MG PO TABS: 1.0000 | ORAL_TABLET | ORAL | 0 refills | Status: DC | PRN

## 2017-01-08 NOTE — Telephone Encounter (Signed)
Please inform Pt that Rx has been placed at front desk for pick up at her convenience. Thank you.

## 2017-01-08 NOTE — Telephone Encounter (Signed)
Caller name: Aileen  Relation to pt: self Call back number: [phone] Pharmacy:  Reason for call: Pt came in office stating is needing refill on oxyCODONE-acetaminophen (PERCOCET) 10-325 MG tablet. Please advise.

## 2017-01-08 NOTE — Telephone Encounter (Signed)
Pt was informed and will pick up rx tomorrow.

## 2017-01-08 NOTE — Telephone Encounter (Signed)
Database please

## 2017-01-08 NOTE — Telephone Encounter (Signed)
Refill x1 Indication for chronic opioid: sbo Medication and dose: oxy 10/325 # pills per month: 120 Last UDS date:  Pain contract signed (Y/N): y Date narcotic database last reviewed (include red flags): 01/08/2017

## 2017-01-08 NOTE — Telephone Encounter (Signed)
Rx printed, awaiting DO signature.  

## 2017-01-08 NOTE — Telephone Encounter (Signed)
[place] printed.

## 2017-01-09 DIAGNOSIS — Z85828 Personal history of other malignant neoplasm of skin: Secondary | ICD-10-CM | POA: Diagnosis not present

## 2017-01-09 DIAGNOSIS — Z23 Encounter for immunization: Secondary | ICD-10-CM | POA: Diagnosis not present

## 2017-01-09 DIAGNOSIS — L400 Psoriasis vulgaris: Secondary | ICD-10-CM | POA: Diagnosis not present

## 2017-01-10 DIAGNOSIS — I4891 Unspecified atrial fibrillation: Secondary | ICD-10-CM | POA: Diagnosis not present

## 2017-01-10 DIAGNOSIS — J449 Chronic obstructive pulmonary disease, unspecified: Secondary | ICD-10-CM | POA: Diagnosis not present

## 2017-01-14 ENCOUNTER — Other Ambulatory Visit: Payer: Self-pay | Admitting: Family

## 2017-01-14 ENCOUNTER — Telehealth: Payer: Self-pay | Admitting: Cardiology

## 2017-01-14 ENCOUNTER — Ambulatory Visit (INDEPENDENT_AMBULATORY_CARE_PROVIDER_SITE_OTHER): Payer: PPO | Admitting: *Deleted

## 2017-01-14 DIAGNOSIS — G4700 Insomnia, unspecified: Secondary | ICD-10-CM

## 2017-01-14 DIAGNOSIS — I428 Other cardiomyopathies: Secondary | ICD-10-CM

## 2017-01-14 DIAGNOSIS — I5022 Chronic systolic (congestive) heart failure: Secondary | ICD-10-CM

## 2017-01-14 NOTE — Telephone Encounter (Signed)
Spoke with pt and reminded pt of remote transmission that is due today. Pt verbalized understanding.   

## 2017-01-15 MED ORDER — ZOLPIDEM TARTRATE 5 MG PO TABS: 5.0000 mg | ORAL_TABLET | Freq: Every evening | ORAL | 0 refills | Status: DC | PRN

## 2017-01-15 NOTE — Progress Notes (Signed)
Remote pacemaker transmission.   

## 2017-01-15 NOTE — Telephone Encounter (Signed)
Rx printed under wrong Provider name and shredded then reprinted under PCP name and placed in PCP red folder for signature.

## 2017-01-15 NOTE — Telephone Encounter (Signed)
Last zolpidem RX: 12/16/16, #30 Last OV: 09/06/16 Next OV: 03/07/17 UDS: 09/06/16 low risk and due 03/08/17.  Rx printed and forwarded to PCP for signature.

## 2017-01-16 ENCOUNTER — Other Ambulatory Visit: Payer: Self-pay | Admitting: Family

## 2017-01-16 DIAGNOSIS — T8189XA Other complications of procedures, not elsewhere classified, initial encounter: Secondary | ICD-10-CM | POA: Diagnosis not present

## 2017-01-16 DIAGNOSIS — M79674 Pain in right toe(s): Secondary | ICD-10-CM | POA: Diagnosis not present

## 2017-01-16 DIAGNOSIS — B351 Tinea unguium: Secondary | ICD-10-CM | POA: Diagnosis not present

## 2017-01-16 DIAGNOSIS — G4700 Insomnia, unspecified: Secondary | ICD-10-CM

## 2017-01-16 LAB — CUP PACEART REMOTE DEVICE CHECK
Battery Remaining Longevity: 27 mo
Battery Voltage: 2.92 V
Brady Statistic AP VP Percent: 0.48 %
Brady Statistic AP VS Percent: 0 %
Brady Statistic AS VP Percent: 99.47 %
Brady Statistic AS VS Percent: 0.04 %
Brady Statistic RA Percent Paced: 0.48 %
Brady Statistic RV Percent Paced: 99.95 %
Date Time Interrogation Session: 20181030210342
Implantable Lead Implant Date: 20111005
Implantable Lead Implant Date: 20111005
Implantable Lead Implant Date: 20111005
Implantable Lead Location: 753858
Implantable Lead Location: 753859
Implantable Lead Location: 753860
Implantable Lead Model: 4196
Implantable Lead Model: 5076
Implantable Lead Model: 5076
Implantable Pulse Generator Implant Date: 20111005
Lead Channel Impedance Value: 304 Ohm
Lead Channel Impedance Value: 361 Ohm
Lead Channel Impedance Value: 418 Ohm
Lead Channel Impedance Value: 475 Ohm
Lead Channel Impedance Value: 513 Ohm
Lead Channel Impedance Value: 513 Ohm
Lead Channel Impedance Value: 532 Ohm
Lead Channel Impedance Value: 551 Ohm
Lead Channel Impedance Value: 741 Ohm
Lead Channel Pacing Threshold Amplitude: 0.5 V
Lead Channel Pacing Threshold Amplitude: 0.5 V
Lead Channel Pacing Threshold Pulse Width: 0.4 ms
Lead Channel Pacing Threshold Pulse Width: 0.4 ms
Lead Channel Sensing Intrinsic Amplitude: 0.75 mV
Lead Channel Sensing Intrinsic Amplitude: 0.75 mV
Lead Channel Sensing Intrinsic Amplitude: 13 mV
Lead Channel Sensing Intrinsic Amplitude: 7.625 mV
Lead Channel Setting Pacing Amplitude: 1.25 V
Lead Channel Setting Pacing Amplitude: 2 V
Lead Channel Setting Pacing Amplitude: 2.5 V
Lead Channel Setting Pacing Pulse Width: 0.4 ms
Lead Channel Setting Pacing Pulse Width: 0.8 ms
Lead Channel Setting Sensing Sensitivity: 0.9 mV

## 2017-01-17 ENCOUNTER — Other Ambulatory Visit: Payer: Self-pay

## 2017-01-17 NOTE — Telephone Encounter (Signed)
Rx for Ambien called in to pharmacy as requested Q:30 R:0

## 2017-01-17 NOTE — Telephone Encounter (Signed)
Looks like this was sent to me in error. Thanks.

## 2017-01-22 ENCOUNTER — Other Ambulatory Visit: Payer: Self-pay | Admitting: Family

## 2017-01-22 ENCOUNTER — Encounter: Payer: Self-pay | Admitting: Cardiology

## 2017-01-22 DIAGNOSIS — R52 Pain, unspecified: Secondary | ICD-10-CM

## 2017-01-24 ENCOUNTER — Other Ambulatory Visit: Payer: Self-pay | Admitting: Family

## 2017-01-24 DIAGNOSIS — R52 Pain, unspecified: Secondary | ICD-10-CM

## 2017-01-29 ENCOUNTER — Other Ambulatory Visit: Payer: Self-pay | Admitting: Physician Assistant

## 2017-01-31 DIAGNOSIS — L97511 Non-pressure chronic ulcer of other part of right foot limited to breakdown of skin: Secondary | ICD-10-CM | POA: Diagnosis not present

## 2017-01-31 DIAGNOSIS — T8189XA Other complications of procedures, not elsewhere classified, initial encounter: Secondary | ICD-10-CM | POA: Diagnosis not present

## 2017-01-31 NOTE — Telephone Encounter (Signed)
This is Dr. Hochrein's pt. °

## 2017-02-10 DIAGNOSIS — J449 Chronic obstructive pulmonary disease, unspecified: Secondary | ICD-10-CM | POA: Diagnosis not present

## 2017-02-10 DIAGNOSIS — I4891 Unspecified atrial fibrillation: Secondary | ICD-10-CM | POA: Diagnosis not present

## 2017-02-12 ENCOUNTER — Other Ambulatory Visit: Payer: Self-pay | Admitting: Family Medicine

## 2017-02-12 DIAGNOSIS — G4700 Insomnia, unspecified: Secondary | ICD-10-CM

## 2017-02-15 NOTE — Telephone Encounter (Signed)
[facility] [address] requesting refill Lorrin Mais  Database ran and is on your desk for review  Last filled per database: 01/17/17 Last written: 01/17/17 Last ov: 09/06/16 Next ov: 03/07/17 Contract: will get at appt on 03/07/17 UDS: due 03/08/17

## 2017-02-16 ENCOUNTER — Other Ambulatory Visit: Payer: Self-pay | Admitting: Family Medicine

## 2017-02-16 DIAGNOSIS — G4700 Insomnia, unspecified: Secondary | ICD-10-CM

## 2017-02-19 ENCOUNTER — Telehealth: Payer: Self-pay | Admitting: Family Medicine

## 2017-02-19 NOTE — Telephone Encounter (Signed)
Copied from [place] [id number]. Topic: Quick Communication - Rx Refill/Question >> Feb 19, 2017  8:54 AM Synthia Innocent wrote: Has the patient contacted their pharmacy? No.   (Agent: If no, request that the patient contact the pharmacy for the refill.)   Preferred Pharmacy (with phone number or street name): Patient will pick up RX   Agent: Please be advised that RX refills may take up to 3 business days. We ask that you follow-up with your pharmacy. Requesting refill on  oxyCODONE-acetaminophen (PERCOCET) 10-325 MG tablet  >> Feb 19, 2017  8:57 AM Synthia Innocent wrote: Patient is also having burning when urinating, would like to leave sample when she come and picks up rx. Please advise

## 2017-02-19 NOTE — Telephone Encounter (Signed)
Copied from [place] [phone]. Topic: Quick Communication - Rx Refill/Question >> Feb 19, 2017  8:54 AM Synthia Innocent wrote: Has the patient contacted their pharmacy? No.   (Agent: If no, request that the patient contact the pharmacy for the refill.)   Preferred Pharmacy (with phone number or street name): Patient will pick up RX   Agent: Please be advised that RX refills may take up to 3 business days. We ask that you follow-up with your pharmacy. Requesting refill on  oxyCODONE-acetaminophen (PERCOCET) 10-325 MG tablet

## 2017-02-20 ENCOUNTER — Ambulatory Visit: Payer: Self-pay | Admitting: *Deleted

## 2017-02-20 ENCOUNTER — Other Ambulatory Visit: Payer: Self-pay | Admitting: Internal Medicine

## 2017-02-20 DIAGNOSIS — R350 Frequency of micturition: Secondary | ICD-10-CM

## 2017-02-20 NOTE — Telephone Encounter (Signed)
Left message for pt to call back to discuss symptoms of burning with urinating. Pt also requesting refill on Percocet, last filled on 01/08/17. Last OV 09/06/16

## 2017-02-20 NOTE — Telephone Encounter (Signed)
   Reason for Disposition . > 2 UTI's in last year  Answer Assessment - Initial Assessment Questions 1. SEVERITY: "How bad is the pain?"  (e.g., Scale 1-10; mild, moderate, or severe)   - MILD (1-3): complains slightly about urination hurting   - MODERATE (4-7): interferes with normal activities     - SEVERE (8-10): excruciating, unwilling or unable to urinate because of the pain      Moderate 2. FREQUENCY: "How many times have you had painful urination today?"      Approximately 6 times today 3. PATTERN: "Is pain present every time you urinate or just sometimes?"      Burning sensation present every time with urniation 4. ONSET: "When did the painful urination start?"      Sunday 5. FEVER: "Do you have a fever?" If so, ask: "What is your temperature, how was it measured, and when did it start?"     No 6. PAST UTI: "Have you had a urine infection before?" If so, ask: "When was the last time?" and "What happened that time?"      Yes, about 3-4 months ago 7. CAUSE: "What do you think is causing the painful urination?"  (e.g., UTI, scratch, Herpes sore)     UTI 8. OTHER SYMPTOMS: "Do you have any other symptoms?" (e.g., flank pain, vaginal discharge, genital sores, urgency, blood in urine)     No, but noticed smelled stronger  Protocols used: URINATION PAIN - FEMALE-A-AH  Pt states she has a history of bladder leakage and is currently on fluid pills so she has not noticed a difference in frequency or urgency with urinating. Pt states that she had UTIs in the past,most recently 3-4 months ago. Pt wanting to know if she would be able to leave urine sample once she picks up narcotic prescription. Pt did not want to make appt at this time due to already having one already scheduled on 12/20.

## 2017-02-21 ENCOUNTER — Other Ambulatory Visit: Payer: Self-pay | Admitting: Family Medicine

## 2017-02-21 ENCOUNTER — Other Ambulatory Visit: Payer: Self-pay

## 2017-02-21 DIAGNOSIS — R52 Pain, unspecified: Secondary | ICD-10-CM

## 2017-02-21 MED ORDER — OXYCODONE-ACETAMINOPHEN 10-325 MG PO TABS: 1.0000 | ORAL_TABLET | ORAL | 0 refills | Status: DC | PRN

## 2017-02-21 NOTE — Telephone Encounter (Signed)
See other phone note

## 2017-02-21 NOTE — Progress Notes (Signed)
Indication for chronic opioid: -- DDD, fibromyalgia Medication and dose: oxy # pills per month: 120 Last UDS date: not due Pain contract signed (Y/N): y Date narcotic database last reviewed (include red flags): 02/21/2017  Sent to pharmacy

## 2017-02-21 NOTE — Telephone Encounter (Signed)
Patient notified rx sent in and she will bring in a urine

## 2017-02-21 NOTE — Telephone Encounter (Signed)
Last filled: 01/08/17 Amt: 120 tablets Last OV: 09/06/16 CCS: 02/09/15 UDS: 09/06/16 (next due 03/08/17) Database:  Printed and given to provider for review.  Rx Pending for Dr.

## 2017-02-21 NOTE — Telephone Encounter (Signed)
Pain med sent 

## 2017-02-26 IMAGING — DX DG CHEST 2V
2 series · 2 of 2 positions shown · non-contrast
Comparison: 05/18/2014

CLINICAL DATA: Emphysema, COPD, progressive cough

EXAM:
CHEST  2 VIEW

[chest pa]
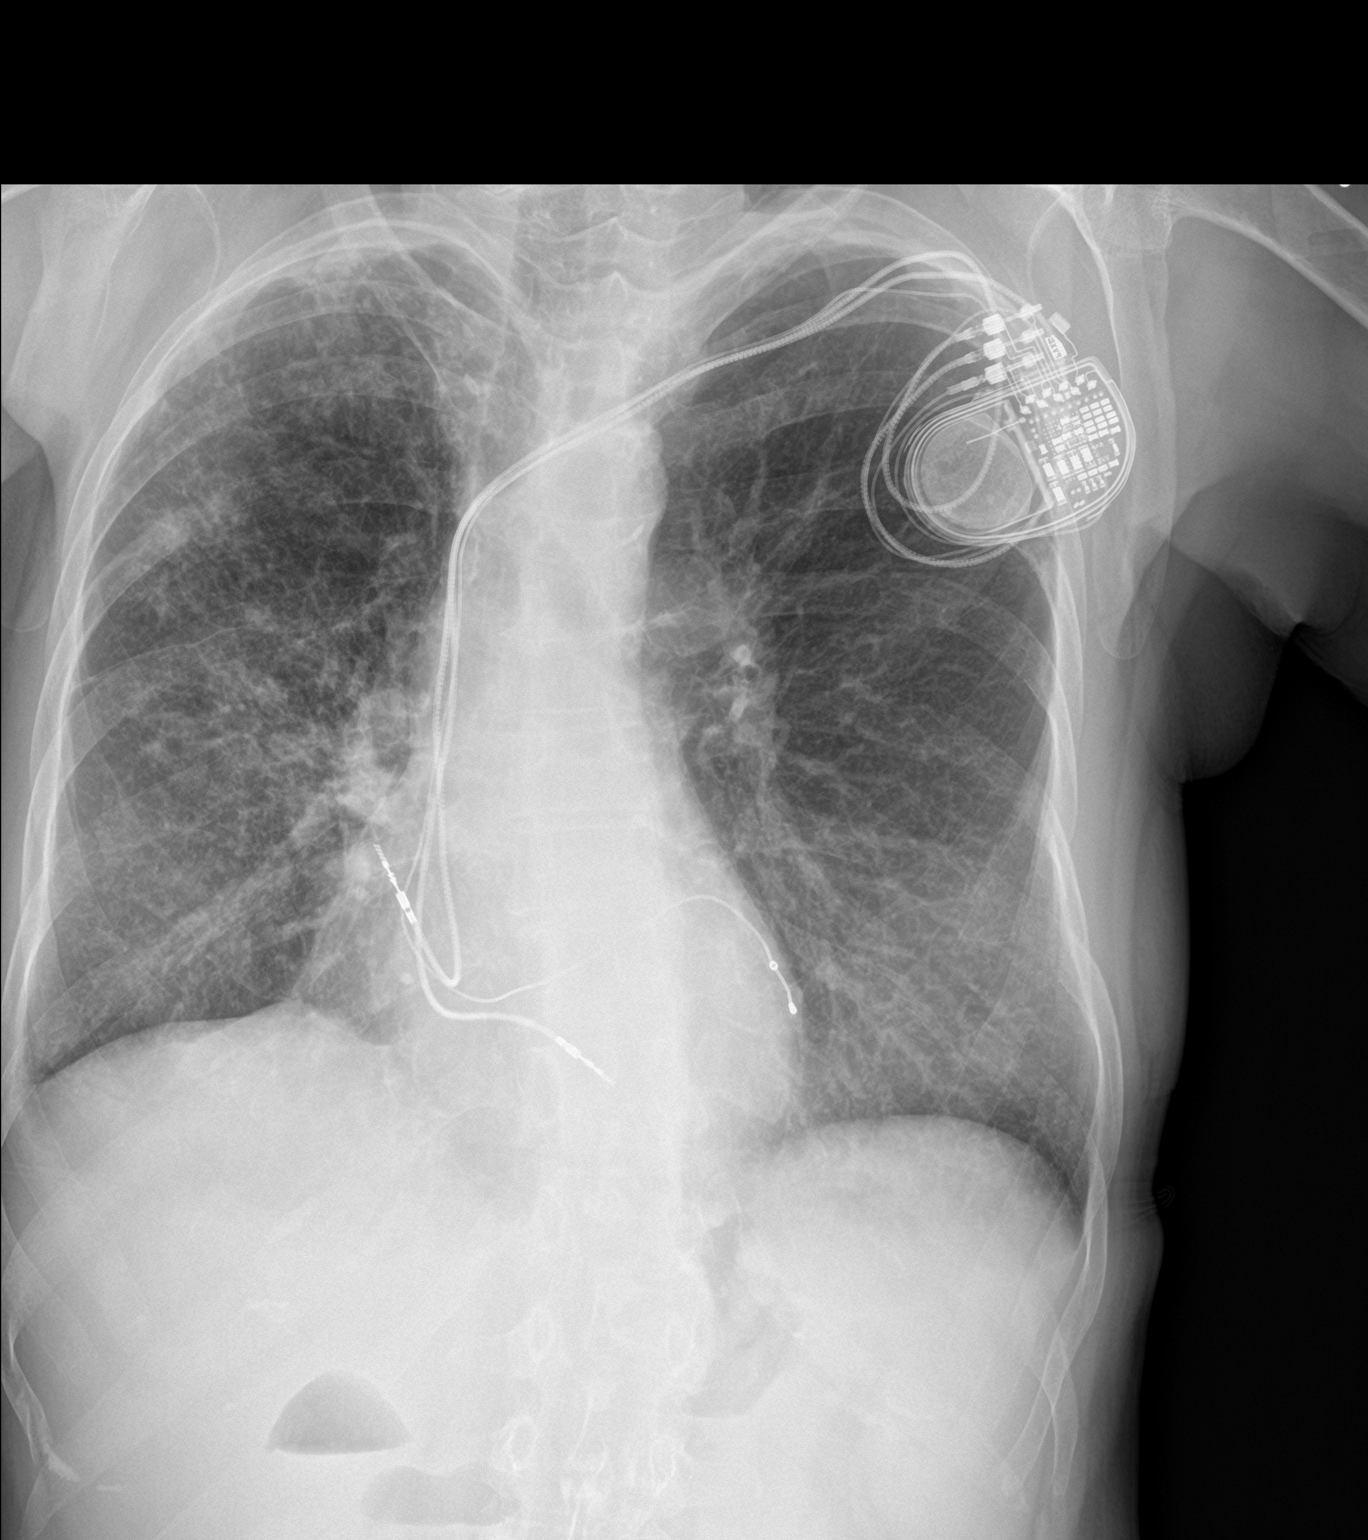

[chest lat]
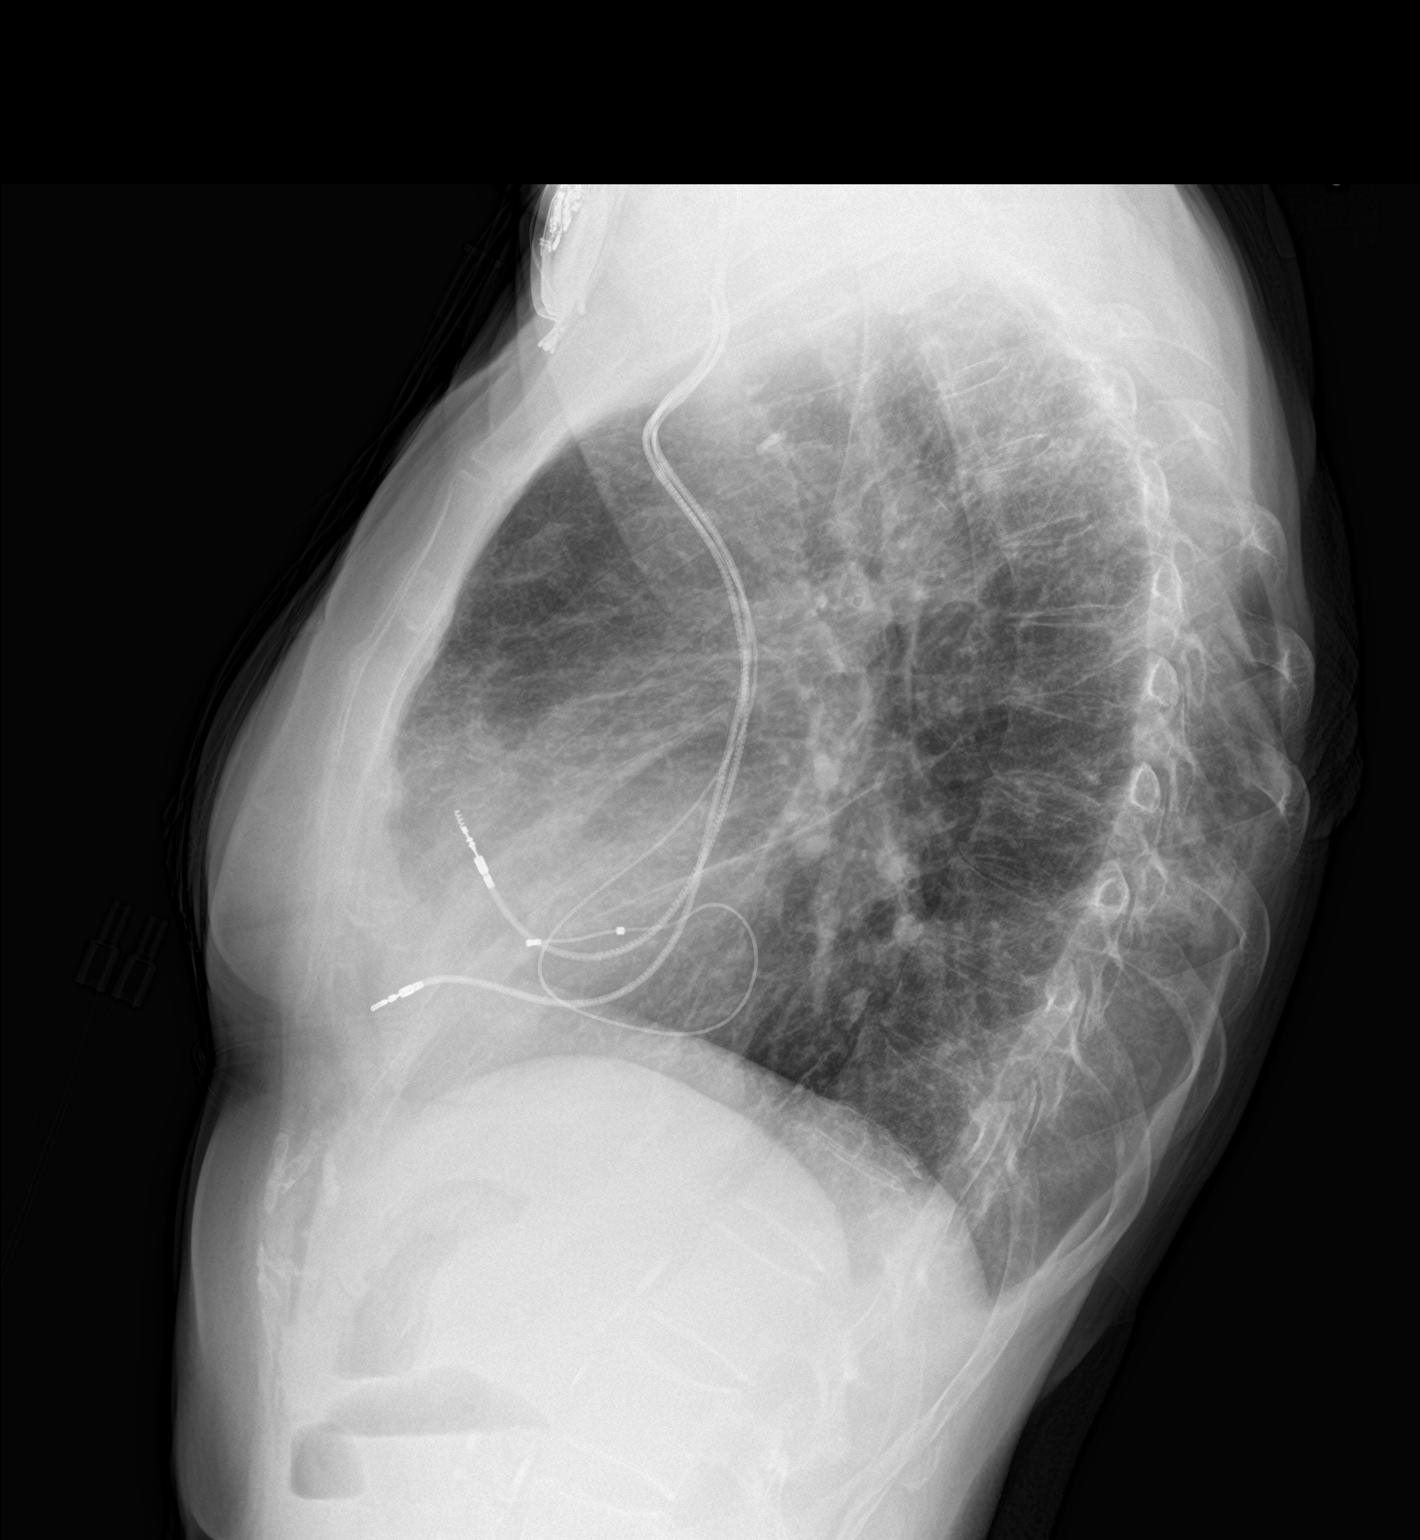

[2 of 2 positions shown; findings below may reference images not displayed]

FINDINGS: Cardiomediastinal silhouette is stable. Three leads cardiac
pacemaker is unchanged in position. Mild perihilar bronchitic
changes. Hyperinflation again noted. There is streaky interstitial
prominence right perihilar and right upper lobe. There is patchy
airspace disease noted in right apex and right perihilar upper lobe.
Superimposed infiltrate or pneumonitis is suspected. Follow-up to
resolution is recommended.
IMPRESSION: Mild perihilar bronchitic changes. Hyperinflation again noted. There
is streaky interstitial prominence right perihilar and right upper
lobe. There is patchy airspace disease noted in right apex and right
perihilar upper lobe. Superimposed infiltrate or pneumonitis is
suspected. Follow-up to resolution is recommended.

## 2017-03-04 ENCOUNTER — Ambulatory Visit: Payer: PPO | Admitting: Family Medicine

## 2017-03-04 ENCOUNTER — Telehealth: Payer: Self-pay | Admitting: Pulmonary Disease

## 2017-03-04 ENCOUNTER — Encounter: Payer: Self-pay | Admitting: Family Medicine

## 2017-03-04 VITALS — BP 110/70 | HR 74 | Temp 98.0°F | Ht 60.0 in | Wt 100.0 lb

## 2017-03-04 DIAGNOSIS — J189 Pneumonia, unspecified organism: Secondary | ICD-10-CM

## 2017-03-04 DIAGNOSIS — J181 Lobar pneumonia, unspecified organism: Secondary | ICD-10-CM | POA: Diagnosis not present

## 2017-03-04 MED ORDER — DOXYCYCLINE HYCLATE 100 MG PO TABS: 100.0000 mg | ORAL_TABLET | Freq: Two times a day (BID) | ORAL | 0 refills | Status: DC

## 2017-03-04 MED ORDER — AZITHROMYCIN 250 MG PO TABS: ORAL_TABLET | ORAL | 0 refills | Status: DC

## 2017-03-04 NOTE — Telephone Encounter (Signed)
Called and spoke with pt. Pt states she has appointment with [facility] this afternoon to address symptoms, as she was made aware that [facility] did not have any availability.  Nothing further is needed.

## 2017-03-04 NOTE — Patient Instructions (Addendum)
Continue to push fluids, practice good hand hygiene, and cover your mouth if you cough.  If you start having fevers, shaking or shortness of breath, seek immediate care.  Let us know if you need anything.  

## 2017-03-04 NOTE — Progress Notes (Signed)
Pre visit review using our clinic review tool, if applicable. No additional management support is needed unless otherwise documented below in the visit note. 

## 2017-03-04 NOTE — Progress Notes (Signed)
Chief Complaint  Patient presents with  . Cough    congestion    [name] here for URI complaints.  Duration: 2 weeks; 2 days ago Associated symptoms: sinus congestion, sinus pain, shortness of breath and cough Denies: rhinorrhea, itchy watery eyes, ear pain, ear drainage, sore throat, wheezing, myalgia and fevers/rigors Treatment to date: increased supp O2 from 2L [place] to 3L [place], Tylenol, Flonase Sick contacts: No  ROS:  Const: Denies fevers HEENT: As noted in HPI Lungs: +SOB  Past Medical History:  Diagnosis Date  . Anemia   . Anginal pain ([place])   . Arthritis    right hip  . Atrial fibrillation ([place])   . Blood transfusion   . CAD (coronary artery disease)    mild disease per cath in 2011  . CHF (congestive heart failure) ([place])   . Chronic abdominal pain   . Chronic nausea   . Chronic pain syndrome   . COPD (chronic obstructive pulmonary disease) (HCC)    continuous O2- 2l  . Diverticulitis   . Elbow fracture, left aug 2012  . Elevated liver enzymes   . Fibromyalgia    COUPLE OF TIMES A YEAR-PT HAS EPISODES OF CONFUSION-USUALLY INVOLVES DAY/NIGHT REVERSAL AND EPISODES OF TWITCHING AND FALLS--SEES DR. Jacelyn Grip - NEUROLOGIST-LAST SEEN 07/10/11  . Gastroparesis   . GERD (gastroesophageal reflux disease)   . H/O: GI bleed    from Pradaxa  . Hepatitis    hx of in high school   . HTN (hypertension)    under control; has been on med. x "years"  . Hx MRSA infection   . Hx of gastric ulcer   . Hx of stress fx aug 2011   right hip   . LBBB (left bundle branch block)   . Oxygen dependent    2 liters via nasal cannula at all times  . Pacemaker    CRT therapy; followed by Dr. Caryl Comes  . Pleural effusion     s/p right thoracentesis 03/09  . Pneumonia - 03/09, 12/09, 02/10, 06/10   MOST RECENT FEB 2013  . Primary dilated cardiomyopathy (HCC)    EF 45 to 50% per echo in Jan 2012  . RLS (restless legs syndrome)   . Silent aspiration   . Small bowel obstruction ([place])   .  Urinary incontinence     -INTERSTIM IMPLANT NOT FUNCTIONING PER PT  . Venous embolism and thrombosis of subclavian vein ([place])    after pacemaker insertion Oct 2011  . Vitamin B12 deficiency    BP 110/70 (BP Location: Left Arm, Patient Position: Sitting, Cuff Size: Normal)   Pulse 74   Temp 98 F (36.7 C) (Oral)   Ht 5' (1.524 m)   Wt 100 lb (45.4 kg)   SpO2 98%   BMI 19.53 kg/m  General: Awake, alert, appears stated age HEENT: AT, [place], ears patent b/l and TM's neg, nares patent w/o discharge, pharynx pink and without exudates, MMM Neck: No masses or asymmetry Heart: RRR, no murmurs, no bruits Lungs: +rales in RLL, no accessory muscle use Psych: Age appropriate judgment and insight, normal mood and affect  Pneumonia of right lower lobe due to infectious organism (HCC) - Plan: azithromycin (ZITHROMAX) 250 MG tablet  Orders as above. Continue to push fluids, practice good hand hygiene, cover mouth when coughing. F/u as originally scheduled on Thurs w reg PCP. If starting to experience fevers, shaking, or shortness of breath, seek immediate care. Pt voiced understanding and agreement to the  plan.  Shelda Pal, DO 03/04/17 2:48 PM

## 2017-03-07 ENCOUNTER — Encounter: Payer: Self-pay | Admitting: Family Medicine

## 2017-03-07 ENCOUNTER — Ambulatory Visit (INDEPENDENT_AMBULATORY_CARE_PROVIDER_SITE_OTHER): Payer: PPO | Admitting: Family Medicine

## 2017-03-07 VITALS — BP 116/66 | HR 70 | Temp 97.9°F | Resp 16 | Ht 60.0 in | Wt 100.8 lb

## 2017-03-07 DIAGNOSIS — I10 Essential (primary) hypertension: Secondary | ICD-10-CM

## 2017-03-07 DIAGNOSIS — R52 Pain, unspecified: Secondary | ICD-10-CM

## 2017-03-07 DIAGNOSIS — R82998 Other abnormal findings in urine: Secondary | ICD-10-CM | POA: Diagnosis not present

## 2017-03-07 DIAGNOSIS — G4700 Insomnia, unspecified: Secondary | ICD-10-CM | POA: Diagnosis not present

## 2017-03-07 DIAGNOSIS — Z79899 Other long term (current) drug therapy: Secondary | ICD-10-CM

## 2017-03-07 DIAGNOSIS — Z0000 Encounter for general adult medical examination without abnormal findings: Secondary | ICD-10-CM | POA: Diagnosis not present

## 2017-03-07 DIAGNOSIS — J181 Lobar pneumonia, unspecified organism: Secondary | ICD-10-CM | POA: Diagnosis not present

## 2017-03-07 DIAGNOSIS — J189 Pneumonia, unspecified organism: Secondary | ICD-10-CM

## 2017-03-07 LAB — COMPREHENSIVE METABOLIC PANEL
ALT: 8 U/L (ref 0–35)
AST: 17 U/L (ref 0–37)
Albumin: 4 g/dL (ref 3.5–5.2)
Alkaline Phosphatase: 116 U/L (ref 39–117)
BUN: 21 mg/dL (ref 6–23)
CO2: 34 mEq/L — ABNORMAL HIGH (ref 19–32)
Calcium: 9.3 mg/dL (ref 8.4–10.5)
Chloride: 95 mEq/L — ABNORMAL LOW (ref 96–112)
Creatinine, Ser: 1.03 mg/dL (ref 0.40–1.20)
GFR: 55.93 mL/min — ABNORMAL LOW (ref >60.00)
Glucose, Bld: 80 mg/dL (ref 70–99)
Potassium: 4.3 mEq/L (ref 3.5–5.1)
Sodium: 138 mEq/L (ref 135–145)
Total Bilirubin: 0.5 mg/dL (ref 0.2–1.2)
Total Protein: 7.9 g/dL (ref 6.0–8.3)

## 2017-03-07 LAB — CBC WITH DIFFERENTIAL/PLATELET
Basophils Absolute: 0.1 10*3/uL (ref 0.0–0.1)
Basophils Relative: 0.5 % (ref 0.0–3.0)
Eosinophils Absolute: 0.1 10*3/uL (ref 0.0–0.7)
Eosinophils Relative: 0.8 % (ref 0.0–5.0)
HCT: 36.4 % (ref 36.0–46.0)
Hemoglobin: 11.6 g/dL — ABNORMAL LOW (ref 12.0–15.0)
Lymphocytes Relative: 3.6 % — ABNORMAL LOW (ref 12.0–46.0)
Lymphs Abs: 0.4 10*3/uL — ABNORMAL LOW (ref 0.7–4.0)
MCHC: 31.9 g/dL (ref 30.0–36.0)
MCV: 92.2 fl (ref 78.0–100.0)
Monocytes Absolute: 0.4 10*3/uL (ref 0.1–1.0)
Monocytes Relative: 3.4 % (ref 3.0–12.0)
Neutro Abs: 10.4 10*3/uL — ABNORMAL HIGH (ref 1.4–7.7)
Neutrophils Relative %: 91.7 % — ABNORMAL HIGH (ref 43.0–77.0)
Platelets: 307 10*3/uL (ref 150.0–400.0)
RBC: 3.95 Mil/uL (ref 3.87–5.11)
RDW: 13.6 % (ref 11.5–15.5)
WBC: 11.4 10*3/uL — ABNORMAL HIGH (ref 4.0–10.5)

## 2017-03-07 LAB — LIPID PANEL
Cholesterol: 151 mg/dL (ref 0–200)
HDL: 36.9 mg/dL — ABNORMAL LOW (ref >39.00)
LDL Cholesterol: 94 mg/dL (ref 0–99)
NonHDL: 114.27
Total CHOL/HDL Ratio: 4
Triglycerides: 100 mg/dL (ref 0.0–149.0)
VLDL: 20 mg/dL (ref 0.0–40.0)

## 2017-03-07 LAB — POC URINALSYSI DIPSTICK (AUTOMATED)
Bilirubin, UA: NEGATIVE
Blood, UA: NEGATIVE
Glucose, UA: NEGATIVE
Ketones, UA: NEGATIVE
Nitrite, UA: NEGATIVE
Protein, UA: NEGATIVE
Spec Grav, UA: 1.015 (ref 1.010–1.025)
Urobilinogen, UA: 0.2 E.U./dL
pH, UA: 6 (ref 5.0–8.0)

## 2017-03-07 MED ORDER — OXYCODONE-ACETAMINOPHEN 10-325 MG PO TABS: 1.0000 | ORAL_TABLET | ORAL | 0 refills | Status: DC | PRN

## 2017-03-07 MED ORDER — TIZANIDINE HCL 4 MG PO TABS: 4.0000 mg | ORAL_TABLET | Freq: Three times a day (TID) | ORAL | 2 refills | Status: DC | PRN

## 2017-03-07 MED ORDER — CEFTRIAXONE SODIUM 1 G IJ SOLR: 1.0000 g | Freq: Once | INTRAMUSCULAR | Status: DC

## 2017-03-07 MED ORDER — GABAPENTIN 600 MG PO TABS: ORAL_TABLET | ORAL | 5 refills | Status: DC

## 2017-03-07 MED ORDER — ZOLPIDEM TARTRATE 5 MG PO TABS: ORAL_TABLET | ORAL | 2 refills | Status: DC

## 2017-03-07 MED ORDER — OXYCODONE-ACETAMINOPHEN 10-325 MG PO TABS: 1.0000 | ORAL_TABLET | Freq: Three times a day (TID) | ORAL | 0 refills | Status: DC | PRN

## 2017-03-07 MED ORDER — CEFTRIAXONE SODIUM 1 G IJ SOLR
1.0000 g | Freq: Once | INTRAMUSCULAR | Status: AC
  Administered 2017-03-07: 1 g via INTRAMUSCULAR

## 2017-03-07 MED ORDER — LEVOFLOXACIN 500 MG PO TABS: 500.0000 mg | ORAL_TABLET | Freq: Every day | ORAL | 0 refills | Status: DC

## 2017-03-07 NOTE — Progress Notes (Signed)
Subjective:     [name] is a 71 y.o. female and is here for a comprehensive physical exam. The patient reports problems - still doesn't feel good -- dx with pneumonia on Monday.  Social History   Socioeconomic History  . Marital status: Married    Spouse name: Not on file  . Number of children: 5  . Years of education: Not on file  . Highest education level: Not on file  Social Needs  . Financial resource strain: Not on file  . Food insecurity - worry: Not on file  . Food insecurity - inability: Not on file  . Transportation needs - medical: Not on file  . Transportation needs - non-medical: Not on file  Occupational History  . Occupation: disabled    Employer: RETIRED  Tobacco Use  . Smoking status: Former Smoker    Packs/day: 1.00    Years: 20.00    Pack years: 20.00    Last attempt to quit: 03/19/1986    Years since quitting: 30.9  . Smokeless tobacco: Never Used  Substance and Sexual Activity  . Alcohol use: No    Alcohol/week: 0.0 oz  . Drug use: No  . Sexual activity: Yes  Other Topics Concern  . Not on file  Social History Narrative   Occupation: Disabled   Married - second time - biologic daughter (in [place]) from first marriage - adopted 4 3 alive with second marriage   Alcohol Use - no   Illicit Drug Use - no   Patient is a former smoker, quit 1988 x66yrs, <1ppd.    Daily Caffeine Use   01/20/2015 - updated   Health Maintenance  Topic Date Due  . MAMMOGRAM  09/14/2015  . Hepatitis C Screening  03/07/2028 (Originally [birth year])  . TETANUS/TDAP  05/15/2024  . INFLUENZA VACCINE  Completed  . DEXA SCAN  Completed  . PNA vac Low Risk Adult  Completed    The following portions of the patient's history were reviewed and updated as appropriate:  She  has a past medical history of Anemia, Anginal pain ([place]), Arthritis, Atrial fibrillation ([place]), Blood transfusion, CAD (coronary artery disease), CHF (congestive heart failure) ([place]), Chronic abdominal pain,  Chronic nausea, Chronic pain syndrome, COPD (chronic obstructive pulmonary disease) ([place]), Diverticulitis, Elbow fracture, left (aug 2012), Elevated liver enzymes, Fibromyalgia, Gastroparesis, GERD (gastroesophageal reflux disease), H/O: GI bleed, Hepatitis, HTN (hypertension), MRSA infection, gastric ulcer, stress fx (aug 2011), LBBB (left bundle branch block), Oxygen dependent, Pacemaker, Pleural effusion, Pneumonia (- 03/09, 12/09, 02/10, 06/10), Primary dilated cardiomyopathy ([place]), RLS (restless legs syndrome), Silent aspiration, Small bowel obstruction ([place]), Urinary incontinence, Venous embolism and thrombosis of subclavian vein ([place]), and Vitamin B12 deficiency. She does not have any pertinent problems on file. She  has a past surgical history that includes Appendectomy; pacemaker placement (12/21/2009); Peg removed; Tonsillectomy (age 66); Interstim implant revision (03/06/2011); Portacath placement (12/16/2009); Total abdominal hysterectomy; Interstim Implant placement (05/28/2006 - stage I); Interstim implant revision (10/23/2007); Cystoscopy with injection (04/30/2006); Small intestine surgery (05/20/2001); Cardiac catheterization (04/08/2006, 11/16/2009); Cystoscopy (11/11/2008); Cystoscopy w/ retrogrades (11/11/2008); Colectomy; Colectomy (02/04/2000); Gastrocutaneous fistula closure; Exploratory laparotomy (04/27/2009); Port-a-cath removal (07/27/2011); Total hip arthroplasty (08/03/2011); Interstim Implant placement (02/05/2012); Esophagogastroduodenoscopy (N/A, 02/02/2013); and Savory dilation (N/A, 02/02/2013). Her family history includes Breast cancer in her maternal aunt; Diabetes in her paternal grandfather; Heart disease in her brother, father, mother, and paternal aunt; Stroke in her mother. She  reports that she quit smoking about 30 years ago. She has a  20.00 pack-year smoking history. she has never used smokeless tobacco. She reports that she does not drink alcohol or use drugs. She has a current  medication list which includes the following prescription(s): AMBULATORY NON FORMULARY MEDICATION, calcium carbonate, carvedilol, denosumab, folic acid, furosemide, gabapentin, methotrexate, myrbetriq, nitroglycerin, oxycodone-acetaminophen, pantoprazole, promethazine, tiotropium bromide monohydrate, tiotropium bromide monohydrate, tizanidine, zolpidem, and levofloxacin. Current Outpatient Medications on File Prior to Visit  Medication Sig Dispense Refill  . AMBULATORY NON FORMULARY MEDICATION Pt takes Oxygen, 2 units, every day    . calcium carbonate (OS-CAL - DOSED IN MG OF ELEMENTAL CALCIUM) 1250 (500 CA) MG tablet Take 2 tablets (1,000 mg of elemental calcium total) by mouth 3 (three) times daily with meals. 180 tablet 0  . carvedilol (COREG) 25 MG tablet TAKE 1 TABLET BY MOUTH TWICE DAILY 180 tablet 1  . denosumab (PROLIA) 60 MG/ML SOLN injection Inject 60 mg into the skin once. Administer in upper arm, thigh, or abdomen (Patient taking differently: Inject 60 mg into the skin every 6 (six) months. Administer in upper arm, thigh, or abdomen) 1.8 mL   . folic acid (FOLVITE) 1 MG tablet Take 1 mg by mouth daily.    . furosemide (LASIX) 40 MG tablet TAKE ONE TABLET BY MOUTH IN THE MORNING 90 tablet 1  . gabapentin (NEURONTIN) 600 MG tablet TAKE 1 TABLET BY MOUTH 4 TIMES DAILY 120 tablet 2  . methotrexate 2.5 MG tablet Take 5 mg by mouth once a week.     [name] MYRBETRIQ 25 MG TB24 tablet Take 1 tablet by mouth daily.    . nitroGLYCERIN (NITROSTAT) 0.4 MG SL tablet Place 1 tablet (0.4 mg total) under the tongue every 5 (five) minutes as needed. CHEST PAIN 25 tablet 1  . oxyCODONE-acetaminophen (PERCOCET) 10-325 MG tablet Take 1 tablet by mouth every 4 (four) hours as needed. 120 tablet 0  . pantoprazole (PROTONIX) 40 MG tablet TAKE 1 TABLET BY MOUTH TWICE DAILY 180 tablet 1  . promethazine (PHENERGAN) 25 MG tablet Take 1 tablet (25 mg total) by mouth every 8 (eight) hours as needed. for nausea 40 tablet 5   . Tiotropium Bromide Monohydrate (SPIRIVA RESPIMAT) 1.25 MCG/ACT AERS Inhale 2 puffs into the lungs daily. 1 Inhaler 5  . Tiotropium Bromide Monohydrate (SPIRIVA RESPIMAT) 1.25 MCG/ACT AERS Inhale 2 puffs into the lungs daily. 2 Inhaler 0  . tiZANidine (ZANAFLEX) 4 MG tablet TAKE 1 TABLET BY MOUTH THREE TIMES DAILY AS NEEDED FOR MUSCLE SPASM 90 tablet 0  . zolpidem (AMBIEN) 5 MG tablet TAKE 1 TABLET BY MOUTH ONCE DAILY AT BEDTIME AS NEEDED FOR  SLEEP 30 tablet 0  . [DISCONTINUED] iron dextran complex (INFED) 50 MG/ML injection Please give infed infusion (no test dose needed) over 4 hours per pharmacy calculated dose. Ht: 5'2 Wt: 99 pounds Hgb: 12 1 mL 0   No current facility-administered medications on file prior to visit.    She is allergic to dabigatran etexilate mesylate; talwin [pentazocine]; pentazocine lactate; lactate; and sulfonamide derivatives..  Review of Systems Review of Systems  Constitutional: Negative for activity change, appetite change + fatigue, + fever HENT: Negative for hearing loss, congestion, tinnitus and ear discharge.  dentist q15m Eyes: Negative for visual disturbance (see optho q1y -- vision corrected to 20/20 with glasses).  Respiratory: + cough and sob on O2 Cardiovascular: Negative for chest pain, palpitations and leg swelling.  Gastrointestinal: Negative for abdominal pain, diarrhea, constipation and abdominal distention.  Genitourinary: Negative for urgency, frequency, decreased urine volume  and difficulty urinating.  Musculoskeletal: Negative for back pain, arthralgias and gait problem.  Skin: Negative for color change, pallor and rash.  Neurological: Negative for dizziness, light-headedness, numbness and headaches.  Hematological: Negative for adenopathy. Does not bruise/bleed easily.  Psychiatric/Behavioral: Negative for suicidal ideas, confusion, sleep disturbance, self-injury, dysphoric mood, decreased concentration and agitation.        Objective:    BP 116/66 (BP Location: Right Arm, Cuff Size: Normal)   Pulse 70   Temp 97.9 F (36.6 C) (Oral)   Resp 16   Ht 5' (1.524 m)   Wt 100 lb 12.8 oz (45.7 kg)   SpO2 93% Comment: on 2 liters of O2  BMI 19.69 kg/m  General appearance: alert, cooperative, appears stated age and mild distress Head: Normocephalic, without obvious abnormality, atraumatic Eyes: negative findings: lids and lashes normal and pupils equal, round, reactive to light and accomodation Ears: normal TM's and external ear canals both ears Nose: Nares normal. Septum midline. Mucosa normal. No drainage or sinus tenderness. Throat: lips, mucosa, and tongue normal; teeth and gums normal Neck: no adenopathy, supple, symmetrical, trachea midline and thyroid not enlarged, symmetric, no tenderness/mass/nodules Back: symmetric, no curvature. ROM normal. No CVA tenderness. Lungs: rhonchi bilaterally  Worse on R side  Breasts: pt did not want to undress Heart: regular rate and rhythm, S1, S2 normal, no murmur, click, rub or gallop Abdomen: soft, non-tender; bowel sounds normal; no masses,  no organomegaly Pelvic: deferred and not indicated; status post hysterectomy, negative ROS Extremities: extremities normal, atraumatic, no cyanosis or edema Pulses: 2+ and symmetric Skin: Skin color, texture, turgor normal. No rashes or lesions Lymph nodes: Cervical, supraclavicular, and axillary nodes normal. Neurologic: Alert and oriented X 3, normal strength and tone. Normal symmetric reflexes. Normal coordination and gait    Assessment:    Healthy female exam.      Plan:    ghm utd Check labs  See After Visit Summary for Counseling Recommendations    1. Essential hypertension Well controlled, no changes to meds. Encouraged heart healthy diet such as the DASH diet and exercise as tolerated.  - POCT Urinalysis Dipstick (Automated) - CBC with Differential/Platelet - Comprehensive metabolic panel - Lipid  panel  2. Preventative health care See above - POCT Urinalysis Dipstick (Automated) - CBC with Differential/Platelet - Comprehensive metabolic panel - Lipid panel  3. Pneumonia of right lower lobe due to infectious organism ([place]) Rocephin 1 gm IM Change abx levaquin Check xray if no improvement over next few days  - levofloxacin (LEVAQUIN) 500 MG tablet; Take 1 tablet (500 mg total) by mouth daily.  Dispense: 7 tablet; Refill: 0 - DG Chest 2 View; Future - cefTRIAXone (ROCEPHIN) injection 1 g  4. Insomnia, unspecified type   - zolpidem (AMBIEN) 5 MG tablet; TAKE 1 TABLET BY MOUTH ONCE DAILY AT BEDTIME AS NEEDED FOR  SLEEP  Dispense: 30 tablet; Refill: 2  5. Pain  - tiZANidine (ZANAFLEX) 4 MG tablet; Take 1 tablet (4 mg total) by mouth 3 (three) times daily as needed. for muscle spams  Dispense: 90 tablet; Refill: 2 - gabapentin (NEURONTIN) 600 MG tablet; TAKE 1 TABLET BY MOUTH 4 TIMES DAILY  Dispense: 120 tablet; Refill: 5 - oxyCODONE-acetaminophen (PERCOCET) 10-325 MG tablet; Take 1 tablet by mouth every 4 (four) hours as needed.  Dispense: 120 tablet; Refill: 0 - oxyCODONE-acetaminophen (PERCOCET) 10-325 MG tablet; Take 1 tablet by mouth every 8 (eight) hours as needed for pain.  Dispense: 120 tablet; Refill: 0  6. Leukocytes in urine   - Urine Culture  7. High risk medication use   - Pain Mgmt, Profile 8 w/Conf, U

## 2017-03-07 NOTE — Patient Instructions (Signed)
Preventive Care 65 Years and Older, Female Preventive care refers to lifestyle choices and visits with your health care provider that can promote health and wellness. What does preventive care include?  A yearly physical exam. This is also called an annual well check.  Dental exams once or twice a year.  Routine eye exams. Ask your health care provider how often you should have your eyes checked.  Personal lifestyle choices, including: ? Daily care of your teeth and gums. ? Regular physical activity. ? Eating a healthy diet. ? Avoiding tobacco and drug use. ? Limiting alcohol use. ? Practicing safe sex. ? Taking low-dose aspirin every day. ? Taking vitamin and mineral supplements as recommended by your health care provider. What happens during an annual well check? The services and screenings done by your health care provider during your annual well check will depend on your age, overall health, lifestyle risk factors, and family history of disease. Counseling Your health care provider may ask you questions about your:  Alcohol use.  Tobacco use.  Drug use.  Emotional well-being.  Home and relationship well-being.  Sexual activity.  Eating habits.  History of falls.  Memory and ability to understand (cognition).  Work and work environment.  Reproductive health.  Screening You may have the following tests or measurements:  Height, weight, and BMI.  Blood pressure.  Lipid and cholesterol levels. These may be checked every 5 years, or more frequently if you are over 50 years old.  Skin check.  Lung cancer screening. You may have this screening every year starting at age 55 if you have a 30-pack-year history of smoking and currently smoke or have quit within the past 15 years.  Fecal occult blood test (FOBT) of the stool. You may have this test every year starting at age 50.  Flexible sigmoidoscopy or colonoscopy. You may have a sigmoidoscopy every 5 years or  a colonoscopy every 10 years starting at age 50.  Hepatitis C blood test.  Hepatitis B blood test.  Sexually transmitted disease (STD) testing.  Diabetes screening. This is done by checking your blood sugar (glucose) after you have not eaten for a while (fasting). You may have this done every 1-3 years.  Bone density scan. This is done to screen for osteoporosis. You may have this done starting at age 65.  Mammogram. This may be done every 1-2 years. Talk to your health care provider about how often you should have regular mammograms.  Talk with your health care provider about your test results, treatment options, and if necessary, the need for more tests. Vaccines Your health care provider may recommend certain vaccines, such as:  Influenza vaccine. This is recommended every year.  Tetanus, diphtheria, and acellular pertussis (Tdap, Td) vaccine. You may need a Td booster every 10 years.  Varicella vaccine. You may need this if you have not been vaccinated.  Zoster vaccine. You may need this after age 60.  Measles, mumps, and rubella (MMR) vaccine. You may need at least one dose of MMR if you were born in 1957 or later. You may also need a second dose.  Pneumococcal 13-valent conjugate (PCV13) vaccine. One dose is recommended after age 65.  Pneumococcal polysaccharide (PPSV23) vaccine. One dose is recommended after age 65.  Meningococcal vaccine. You may need this if you have certain conditions.  Hepatitis A vaccine. You may need this if you have certain conditions or if you travel or work in places where you may be exposed to hepatitis   A.  Hepatitis B vaccine. You may need this if you have certain conditions or if you travel or work in places where you may be exposed to hepatitis B.  Haemophilus influenzae type b (Hib) vaccine. You may need this if you have certain conditions.  Talk to your health care provider about which screenings and vaccines you need and how often you  need them. This information is not intended to replace advice given to you by your health care provider. Make sure you discuss any questions you have with your health care provider. Document Released: 04/01/2015 Document Revised: 11/23/2015 Document Reviewed: 01/04/2015 Elsevier Interactive Patient Education  2018 [employer]  

## 2017-03-08 ENCOUNTER — Telehealth: Payer: Self-pay | Admitting: Family Medicine

## 2017-03-08 NOTE — Telephone Encounter (Signed)
Should be every 4 hours.

## 2017-03-08 NOTE — Telephone Encounter (Signed)
[place] to verify the patient's prescription dosing on Percocet ordered q4h prn and Percocet ordered q8h prn. Unable to clarify, informed pharmacist the provider will be notified for clarification, pharmacist call back number [phone].

## 2017-03-08 NOTE — Telephone Encounter (Signed)
Please send in a new oxycodone rx please

## 2017-03-08 NOTE — Addendum Note (Signed)
Addended by: Kem Boroughs D on: 03/08/2017 05:12 PM   Modules accepted: Orders

## 2017-03-09 ENCOUNTER — Other Ambulatory Visit: Payer: Self-pay | Admitting: Family Medicine

## 2017-03-09 DIAGNOSIS — R52 Pain, unspecified: Secondary | ICD-10-CM

## 2017-03-09 MED ORDER — OXYCODONE-ACETAMINOPHEN 10-325 MG PO TABS: 1.0000 | ORAL_TABLET | ORAL | 0 refills | Status: DC | PRN

## 2017-03-09 NOTE — Telephone Encounter (Signed)
done

## 2017-03-10 LAB — URINE CULTURE
MICRO NUMBER:: 81433728
SPECIMEN QUALITY:: ADEQUATE

## 2017-03-11 LAB — PAIN MGMT, PROFILE 8 W/CONF, U
6 Acetylmorphine: NEGATIVE ng/mL (ref <10)
Alcohol Metabolites: NEGATIVE ng/mL (ref <500)
Amphetamines: NEGATIVE ng/mL (ref <500)
Benzodiazepines: NEGATIVE ng/mL (ref <100)
Buprenorphine, Urine: NEGATIVE ng/mL (ref <5)
Cocaine Metabolite: NEGATIVE ng/mL (ref <150)
Codeine: NEGATIVE ng/mL (ref <50)
Creatinine: 25.6 mg/dL
Hydrocodone: NEGATIVE ng/mL (ref <50)
Hydromorphone: NEGATIVE ng/mL (ref <50)
MDMA: NEGATIVE ng/mL (ref <500)
Marijuana Metabolite: NEGATIVE ng/mL (ref <20)
Morphine: NEGATIVE ng/mL (ref <50)
Norhydrocodone: NEGATIVE ng/mL (ref <50)
Noroxycodone: 775 ng/mL — ABNORMAL HIGH (ref <50)
Opiates: NEGATIVE ng/mL (ref <100)
Oxidant: NEGATIVE ug/mL (ref <200)
Oxycodone: 1009 ng/mL — ABNORMAL HIGH (ref <50)
Oxycodone: POSITIVE ng/mL — AB (ref <100)
Oxymorphone: 1645 ng/mL — ABNORMAL HIGH (ref <50)
pH: 7.46 (ref 4.5–9.0)

## 2017-03-12 DIAGNOSIS — I4891 Unspecified atrial fibrillation: Secondary | ICD-10-CM | POA: Diagnosis not present

## 2017-03-12 DIAGNOSIS — J449 Chronic obstructive pulmonary disease, unspecified: Secondary | ICD-10-CM | POA: Diagnosis not present

## 2017-03-21 ENCOUNTER — Ambulatory Visit (HOSPITAL_BASED_OUTPATIENT_CLINIC_OR_DEPARTMENT_OTHER)
Admission: RE | Admit: 2017-03-21 | Discharge: 2017-03-21 | Disposition: A | Discharge status: Home / Self Care | Payer: PPO | Source: Ambulatory Visit | Attending: Family Medicine | Admitting: Family Medicine

## 2017-03-21 ENCOUNTER — Ambulatory Visit (INDEPENDENT_AMBULATORY_CARE_PROVIDER_SITE_OTHER): Payer: PPO | Admitting: Family Medicine

## 2017-03-21 ENCOUNTER — Encounter: Payer: Self-pay | Admitting: Family Medicine

## 2017-03-21 ENCOUNTER — Other Ambulatory Visit: Payer: Self-pay | Admitting: Family Medicine

## 2017-03-21 VITALS — BP 76/42 | HR 69 | Temp 97.7°F | Ht 60.0 in | Wt 101.0 lb

## 2017-03-21 DIAGNOSIS — J439 Emphysema, unspecified: Secondary | ICD-10-CM | POA: Diagnosis not present

## 2017-03-21 DIAGNOSIS — J189 Pneumonia, unspecified organism: Secondary | ICD-10-CM | POA: Diagnosis not present

## 2017-03-21 DIAGNOSIS — J181 Lobar pneumonia, unspecified organism: Secondary | ICD-10-CM

## 2017-03-21 DIAGNOSIS — Z1231 Encounter for screening mammogram for malignant neoplasm of breast: Secondary | ICD-10-CM

## 2017-03-21 DIAGNOSIS — J984 Other disorders of lung: Secondary | ICD-10-CM | POA: Insufficient documentation

## 2017-03-21 LAB — CBC WITH DIFFERENTIAL/PLATELET
Basophils Absolute: 0.1 10*3/uL (ref 0.0–0.1)
Basophils Relative: 1 % (ref 0.0–3.0)
Eosinophils Absolute: 0.3 10*3/uL (ref 0.0–0.7)
Eosinophils Relative: 5.3 % — ABNORMAL HIGH (ref 0.0–5.0)
HCT: 32.2 % — ABNORMAL LOW (ref 36.0–46.0)
Hemoglobin: 10.6 g/dL — ABNORMAL LOW (ref 12.0–15.0)
Lymphocytes Relative: 9.4 % — ABNORMAL LOW (ref 12.0–46.0)
Lymphs Abs: 0.5 10*3/uL — ABNORMAL LOW (ref 0.7–4.0)
MCHC: 32.8 g/dL (ref 30.0–36.0)
MCV: 91.4 fl (ref 78.0–100.0)
Monocytes Absolute: 0.5 10*3/uL (ref 0.1–1.0)
Monocytes Relative: 8.6 % (ref 3.0–12.0)
Neutro Abs: 4.3 10*3/uL (ref 1.4–7.7)
Neutrophils Relative %: 75.7 % (ref 43.0–77.0)
Platelets: 256 10*3/uL (ref 150.0–400.0)
RBC: 3.53 Mil/uL — ABNORMAL LOW (ref 3.87–5.11)
RDW: 13.9 % (ref 11.5–15.5)
WBC: 5.7 10*3/uL (ref 4.0–10.5)

## 2017-03-21 LAB — COMPREHENSIVE METABOLIC PANEL
ALT: 54 U/L — ABNORMAL HIGH (ref 0–35)
AST: 34 U/L (ref 0–37)
Albumin: 3.8 g/dL (ref 3.5–5.2)
Alkaline Phosphatase: 201 U/L — ABNORMAL HIGH (ref 39–117)
BUN: 17 mg/dL (ref 6–23)
CO2: 37 mEq/L — ABNORMAL HIGH (ref 19–32)
Calcium: 9 mg/dL (ref 8.4–10.5)
Chloride: 97 mEq/L (ref 96–112)
Creatinine, Ser: 1.01 mg/dL (ref 0.40–1.20)
GFR: 57.21 mL/min — ABNORMAL LOW (ref >60.00)
Glucose, Bld: 95 mg/dL (ref 70–99)
Potassium: 4.5 mEq/L (ref 3.5–5.1)
Sodium: 139 mEq/L (ref 135–145)
Total Bilirubin: 0.4 mg/dL (ref 0.2–1.2)
Total Protein: 6.7 g/dL (ref 6.0–8.3)

## 2017-03-21 MED ORDER — DOXYCYCLINE HYCLATE 100 MG PO TABS: 100.0000 mg | ORAL_TABLET | Freq: Two times a day (BID) | ORAL | 0 refills | Status: DC

## 2017-03-21 NOTE — Patient Instructions (Signed)

## 2017-03-21 NOTE — Progress Notes (Signed)
Patient ID: [name], female    DOB: Feb 27, 1946  Age: 72 y.o. MRN: [id number]    Subjective:  Subjective  HPI [name] presents for f/u pneumonia.  She is feeling better but is still coughing.    Review of Systems  Constitutional: Positive for fatigue. Negative for activity change, appetite change and unexpected weight change.  Respiratory: Positive for cough. Negative for shortness of breath.   Cardiovascular: Negative for chest pain and palpitations.  Psychiatric/Behavioral: Negative for behavioral problems and dysphoric mood. The patient is not nervous/anxious.     History Past Medical History:  Diagnosis Date  . Anemia   . Anginal pain ([place])   . Arthritis    right hip  . Atrial fibrillation ([place])   . Blood transfusion   . CAD (coronary artery disease)    mild disease per cath in 2011  . CHF (congestive heart failure) ([place])   . Chronic abdominal pain   . Chronic nausea   . Chronic pain syndrome   . COPD (chronic obstructive pulmonary disease) (H[facility])    continuous O2- 2l  . Diverticulitis   . Elbow fracture, left aug 2012  . Elevated liver enzymes   . Fibromyalgia    COUPLE OF TIMES A YEAR-PT HAS EPISODES OF CONFUSION-USUALLY INVOLVES DAY/NIGHT REVERSAL AND EPISODES OF TWITCHING AND FALLS--SEES DR. Jacelyn Grip - NEUROLOGIST-LAST SEEN 07/10/11  . Gastroparesis   . GERD (gastroesophageal reflux disease)   . H/O: GI bleed    from Pradaxa  . Hepatitis    hx of in high school   . HTN (hypertension)    under control; has been on med. x "years"  . Hx MRSA infection   . Hx of gastric ulcer   . Hx of stress fx aug 2011   right hip   . LBBB (left bundle branch block)   . Oxygen dependent    2 liters via nasal cannula at all times  . Pacemaker    CRT therapy; followed by Dr. Caryl Comes  . Pleural effusion     s/p right thoracentesis 03/09  . Pneumonia - 03/09, 12/09, 02/10, 06/10   MOST RECENT FEB 2013  . Primary dilated cardiomyopathy (H[facility])    EF 45 to 50% per echo  in Jan 2012  . RLS (restless legs syndrome)   . Silent aspiration   . Small bowel obstruction ([place])   . Urinary incontinence     -INTERSTIM IMPLANT NOT FUNCTIONING PER PT  . Venous embolism and thrombosis of subclavian vein ([place])    after pacemaker insertion Oct 2011  . Vitamin B12 deficiency     She has a past surgical history that includes Appendectomy; pacemaker placement (12/21/2009); Peg removed; Tonsillectomy (age 73); Interstim implant revision (03/06/2011); Portacath placement (12/16/2009); Total abdominal hysterectomy; Interstim Implant placement (05/28/2006 - stage I); Interstim implant revision (10/23/2007); Cystoscopy with injection (04/30/2006); Small intestine surgery (05/20/2001); Cardiac catheterization (04/08/2006, 11/16/2009); Cystoscopy (11/11/2008); Cystoscopy w/ retrogrades (11/11/2008); Colectomy; Colectomy (02/04/2000); Gastrocutaneous fistula closure; Exploratory laparotomy (04/27/2009); Port-a-cath removal (07/27/2011); Total hip arthroplasty (08/03/2011); Interstim Implant placement (02/05/2012); Esophagogastroduodenoscopy (N/A, 02/02/2013); and Savory dilation (N/A, 02/02/2013).   Her family history includes Breast cancer in her maternal aunt; Diabetes in her paternal grandfather; Heart disease in her brother, father, mother, and paternal aunt; Stroke in her mother.She reports that she quit smoking about 31 years ago. She has a 20.00 pack-year smoking history. she has never used smokeless tobacco. She reports that she does not drink alcohol or use drugs.  Current  Outpatient Medications on File Prior to Visit  Medication Sig Dispense Refill  . AMBULATORY NON FORMULARY MEDICATION Pt takes Oxygen, 2 units, every day    . calcium carbonate (OS-CAL - DOSED IN MG OF ELEMENTAL CALCIUM) 1250 (500 CA) MG tablet Take 2 tablets (1,000 mg of elemental calcium total) by mouth 3 (three) times daily with meals. 180 tablet 0  . carvedilol (COREG) 25 MG tablet TAKE 1 TABLET BY MOUTH TWICE DAILY 180  tablet 1  . denosumab (PROLIA) 60 MG/ML SOLN injection Inject 60 mg into the skin once. Administer in upper arm, thigh, or abdomen (Patient taking differently: Inject 60 mg into the skin every 6 (six) months. Administer in upper arm, thigh, or abdomen) 1.8 mL   . folic acid (FOLVITE) 1 MG tablet Take 1 mg by mouth daily.    . furosemide (LASIX) 40 MG tablet TAKE ONE TABLET BY MOUTH IN THE MORNING 90 tablet 1  . gabapentin (NEURONTIN) 600 MG tablet TAKE 1 TABLET BY MOUTH 4 TIMES DAILY 120 tablet 5  . levofloxacin (LEVAQUIN) 500 MG tablet Take 1 tablet (500 mg total) by mouth daily. 7 tablet 0  . methotrexate 2.5 MG tablet Take 5 mg by mouth once a week.     [name] MYRBETRIQ 25 MG TB24 tablet Take 1 tablet by mouth daily.    . nitroGLYCERIN (NITROSTAT) 0.4 MG SL tablet Place 1 tablet (0.4 mg total) under the tongue every 5 (five) minutes as needed. CHEST PAIN 25 tablet 1  . oxyCODONE-acetaminophen (PERCOCET) 10-325 MG tablet Take 1 tablet by mouth every 4 (four) hours as needed. 120 tablet 0  . pantoprazole (PROTONIX) 40 MG tablet TAKE 1 TABLET BY MOUTH TWICE DAILY 180 tablet 1  . promethazine (PHENERGAN) 25 MG tablet Take 1 tablet (25 mg total) by mouth every 8 (eight) hours as needed. for nausea 40 tablet 5  . Tiotropium Bromide Monohydrate (SPIRIVA RESPIMAT) 1.25 MCG/ACT AERS Inhale 2 puffs into the lungs daily. 1 Inhaler 5  . Tiotropium Bromide Monohydrate (SPIRIVA RESPIMAT) 1.25 MCG/ACT AERS Inhale 2 puffs into the lungs daily. 2 Inhaler 0  . tiZANidine (ZANAFLEX) 4 MG tablet Take 1 tablet (4 mg total) by mouth 3 (three) times daily as needed. for muscle spams 90 tablet 2  . zolpidem (AMBIEN) 5 MG tablet TAKE 1 TABLET BY MOUTH ONCE DAILY AT BEDTIME AS NEEDED FOR  SLEEP 30 tablet 2  . [DISCONTINUED] iron dextran complex (INFED) 50 MG/ML injection Please give infed infusion (no test dose needed) over 4 hours per pharmacy calculated dose. Ht: 5'2 Wt: 99 pounds Hgb: 12 1 mL 0   No current  facility-administered medications on file prior to visit.      Objective:  Objective  Physical Exam  Constitutional: She is oriented to person, place, and time. She appears well-developed and well-nourished.  HENT:  Right Ear: External ear normal.  Left Ear: External ear normal.  + PND + errythema  Eyes: Conjunctivae are normal. Right eye exhibits no discharge. Left eye exhibits no discharge.  Cardiovascular: Normal rate, regular rhythm and normal heart sounds.  No murmur heard. Pulmonary/Chest: Effort normal. No respiratory distress. She has no wheezes. She has rales in the right middle field and the right lower field. She exhibits no tenderness.  Musculoskeletal: She exhibits no edema.  Lymphadenopathy:    She has no cervical adenopathy.  Neurological: She is alert and oriented to person, place, and time.  Nursing note and vitals reviewed.  BP (!) 76/42  Pulse 69   Temp 97.7 F (36.5 C) (Oral)   Ht 5' (1.524 m)   Wt 101 lb (45.8 kg)   SpO2 95%   BMI 19.73 kg/m  Wt Readings from Last 3 Encounters:  03/21/17 101 lb (45.8 kg)  03/07/17 100 lb 12.8 oz (45.7 kg)  03/04/17 100 lb (45.4 kg)     Lab Results  Component Value Date   WBC 11.4 (H) 03/07/2017   HGB 11.6 (L) 03/07/2017   HCT 36.4 03/07/2017   PLT 307.0 03/07/2017   GLUCOSE 80 03/07/2017   CHOL 151 03/07/2017   TRIG 100.0 03/07/2017   HDL 36.90 (L) 03/07/2017   LDLCALC 94 03/07/2017   ALT 8 03/07/2017   AST 17 03/07/2017   NA 138 03/07/2017   K 4.3 03/07/2017   CL 95 (L) 03/07/2017   CREATININE 1.03 03/07/2017   BUN 21 03/07/2017   CO2 34 (H) 03/07/2017   TSH 4.43 06/26/2010   INR 1.12 05/20/2014   HGBA1C  08/17/2007    5.6 (NOTE)   The ADA recommends the following therapeutic goals for glycemic   control related to Hgb A1C measurement:   Goal of Therapy:   < 7.0% Hgb A1C   Action Suggested:  > 8.0% Hgb A1C   Ref:  Diabetes Care, 22, Suppl. 1, 1999    Dg Chest 2 View  Result Date:  04/30/2016 CLINICAL DATA:  Emphysema, COPD, progressive cough EXAM: CHEST  2 VIEW COMPARISON:  05/18/2014 FINDINGS: Cardiomediastinal silhouette is stable. Three leads cardiac pacemaker is unchanged in position. Mild perihilar bronchitic changes. Hyperinflation again noted. There is streaky interstitial prominence right perihilar and right upper lobe. There is patchy airspace disease noted in right apex and right perihilar upper lobe. Superimposed infiltrate or pneumonitis is suspected. Follow-up to resolution is recommended. IMPRESSION: Mild perihilar bronchitic changes. Hyperinflation again noted. There is streaky interstitial prominence right perihilar and right upper lobe. There is patchy airspace disease noted in right apex and right perihilar upper lobe. Superimposed infiltrate or pneumonitis is suspected. Follow-up to resolution is recommended. Electronically Signed   By: Lahoma Crocker M.D.   On: 04/30/2016 12:49     Assessment & Plan:  Plan  I am having [name] start on doxycycline. I am also having her maintain her MYRBETRIQ, AMBULATORY NON FORMULARY MEDICATION, calcium carbonate, folic acid, methotrexate, denosumab, promethazine, Tiotropium Bromide Monohydrate, nitroGLYCERIN, Tiotropium Bromide Monohydrate, furosemide, carvedilol, pantoprazole, levofloxacin, zolpidem, tiZANidine, gabapentin, and oxyCODONE-acetaminophen.  Meds ordered this encounter  Medications  . doxycycline (VIBRA-TABS) 100 MG tablet    Sig: Take 1 tablet (100 mg total) by mouth 2 (two) times daily.    Dispense:  20 tablet    Refill:  0    Problem List Items Addressed This Visit    None    Visit Diagnoses    Community acquired pneumonia of left lower lobe of lung ([place])    -  Primary   Relevant Medications   doxycycline (VIBRA-TABS) 100 MG tablet   Other Relevant Orders   CBC with Differential/Platelet   Comprehensive metabolic panel    check xray today and recheck labs Pt is feeling better but still  coughing up mucus   Follow-up: Return in about 2 weeks (around 04/04/2017), or if symptoms worsen or fail to improve, for cap.  Ann Held, DO

## 2017-03-22 ENCOUNTER — Encounter: Payer: Self-pay | Admitting: Family Medicine

## 2017-03-22 ENCOUNTER — Telehealth: Payer: Self-pay | Admitting: *Deleted

## 2017-03-22 DIAGNOSIS — R911 Solitary pulmonary nodule: Secondary | ICD-10-CM

## 2017-03-22 NOTE — Telephone Encounter (Signed)
Patient has not been taking iron because it stops her up (constipation).  Would you like to advise on anything else to take?   She was also notified of labs.  CT ordered.

## 2017-03-22 NOTE — Telephone Encounter (Signed)
-----  Message from Ann Held, DO sent at 03/21/2017  5:52 PM EST ----- Liver function slightly elevated--  Alk phos, alt Recheck 3 months + anemic is she taking iron?

## 2017-03-22 NOTE — Telephone Encounter (Signed)
Her GI may be able to do iron infusion for her

## 2017-03-22 NOTE — Telephone Encounter (Signed)
We schedule it -- please put in

## 2017-03-25 NOTE — Telephone Encounter (Signed)
Patient will GI.

## 2017-03-28 ENCOUNTER — Ambulatory Visit (HOSPITAL_BASED_OUTPATIENT_CLINIC_OR_DEPARTMENT_OTHER)
Admission: RE | Admit: 2017-03-28 | Discharge: 2017-03-28 | Disposition: A | Discharge status: Home / Self Care | Payer: PPO | Source: Ambulatory Visit | Attending: Family Medicine | Admitting: Family Medicine

## 2017-03-28 ENCOUNTER — Encounter (HOSPITAL_BASED_OUTPATIENT_CLINIC_OR_DEPARTMENT_OTHER): Payer: Self-pay

## 2017-03-28 ENCOUNTER — Ambulatory Visit (HOSPITAL_BASED_OUTPATIENT_CLINIC_OR_DEPARTMENT_OTHER): Payer: PPO

## 2017-03-28 DIAGNOSIS — J479 Bronchiectasis, uncomplicated: Secondary | ICD-10-CM | POA: Diagnosis not present

## 2017-03-28 DIAGNOSIS — Z9889 Other specified postprocedural states: Secondary | ICD-10-CM | POA: Diagnosis not present

## 2017-03-28 DIAGNOSIS — K838 Other specified diseases of biliary tract: Secondary | ICD-10-CM | POA: Diagnosis not present

## 2017-03-28 DIAGNOSIS — R911 Solitary pulmonary nodule: Secondary | ICD-10-CM

## 2017-03-28 DIAGNOSIS — R918 Other nonspecific abnormal finding of lung field: Secondary | ICD-10-CM | POA: Insufficient documentation

## 2017-03-28 DIAGNOSIS — R590 Localized enlarged lymph nodes: Secondary | ICD-10-CM | POA: Insufficient documentation

## 2017-03-28 DIAGNOSIS — I700 Atherosclerosis of aorta: Secondary | ICD-10-CM | POA: Diagnosis not present

## 2017-03-28 DIAGNOSIS — I2510 Atherosclerotic heart disease of native coronary artery without angina pectoris: Secondary | ICD-10-CM | POA: Diagnosis not present

## 2017-03-28 MED ORDER — IOPAMIDOL (ISOVUE-300) INJECTION 61%
100.0000 mL | Freq: Once | INTRAVENOUS | Status: AC | PRN
  Administered 2017-03-28: 80 mL via INTRAVENOUS

## 2017-03-29 ENCOUNTER — Other Ambulatory Visit: Payer: Self-pay

## 2017-03-29 DIAGNOSIS — J158 Pneumonia due to other specified bacteria: Principal | ICD-10-CM

## 2017-03-29 DIAGNOSIS — A319 Mycobacterial infection, unspecified: Secondary | ICD-10-CM

## 2017-03-29 MED ORDER — AZITHROMYCIN 250 MG PO TABS: ORAL_TABLET | ORAL | 0 refills | Status: DC

## 2017-03-29 NOTE — Telephone Encounter (Signed)
Copied from [place] [id number]. Topic: General - Other >> Mar 29, 2017  4:53 PM Neva Seat wrote: Pt is needing results of recent CT scan and needing to discuss if she needs a Rx called in.  Pt has called earlier today.

## 2017-03-29 NOTE — Telephone Encounter (Signed)
Change to z pak  As directed 0 refills

## 2017-03-29 NOTE — Addendum Note (Signed)
Addended by: Roma Kayser on: 03/29/2017 02:25 PM   Modules accepted: Orders

## 2017-04-01 NOTE — Telephone Encounter (Signed)
CT CHEST W CONTRAST (Accession [id number]) (Order [id number])  Imaging  Date: 03/28/2017 Department: [facility] Released By: Katha Hamming Authorizing: Ann Held, DO  Exam Information   Status Exam Begun  Exam Ended   Final [99] 03/28/2017 9:59 AM 03/28/2017 10:19 AM  PACS Images   Show images for CT CHEST W CONTRAST  Study Result   CLINICAL DATA:  Possible new right lower lung pulmonary nodule on recent chest radiograph.  EXAM: CT CHEST WITH CONTRAST  TECHNIQUE: Multidetector CT imaging of the chest was performed during intravenous contrast administration.  CONTRAST:  57mL ISOVUE-300 IOPAMIDOL (ISOVUE-300) INJECTION 61%  COMPARISON:  03/21/2017 chest radiograph.  05/04/2011 chest CT.  FINDINGS: Cardiovascular: Normal heart size. No significant pericardial fluid/thickening. Three lead left subclavian pacemaker is noted with lead tips in right atrium, right ventricle and coronary sinus. Left anterior descending and left circumflex coronary atherosclerosis. Atherosclerotic nonaneurysmal thoracic aorta. Normal caliber pulmonary arteries. No central pulmonary emboli.  Mediastinum/Nodes: No discrete thyroid nodules. Unremarkable esophagus. No axillary adenopathy. Mild right paratracheal adenopathy up to the 1.0 cm (series 2/image 25) is stable since 05/04/2011. Mildly enlarged 1.1 cm subcarinal node (series 2/image 57) is stable. Mild right hilar adenopathy up to 1.0 cm (series 2/image 54) appears stable compared to the noncontrast 05/04/2011 chest CT. No pathologically enlarged left hilar nodes.  Lungs/Pleura: No pneumothorax. No pleural effusion. There is mild-to-moderate cylindrical bronchiectasis extensively distributed throughout both lungs involving all lung lobes, asymmetrically prominent in the right lung, with associated diffuse bronchial wall thickening. There is associated prominent patchy tree-in-bud  opacity throughout both lungs at the areas of bronchiectasis, involving the right greater than left lungs. There are patchy nodular confluent foci of consolidation throughout the peribronchovascular and peripheral lungs bilaterally, asymmetrically prominent in the right lung. These findings are chronic and have progressed significantly since 05/04/2011 chest CT. Several of nodular foci of consolidation are new/increased, particularly in basilar lower lobes, right greater than left. Representative new 1.0 cm peribronchovascular right middle lobe nodular opacity (series 3/image 59).  Upper abdomen: Simple 1.0 cm cystic central splenic lesion, compatible with a benign splenic lymphangioma. Mild diffuse intrahepatic biliary ductal dilatation with dilated visualized proximal common bile duct (8 mm diameter), not definitely changed since 07/29/2014 noncontrast CT abdomen study.  Musculoskeletal: No aggressive appearing focal osseous lesions. Mild-to-moderate thoracic spondylosis.  IMPRESSION: 1. Significant interval progression of spectrum of findings suggestive of chronic infectious bronchiolitis due to atypical mycobacterial infection (MAI), asymmetrically involving the right lung. Diffuse cylindrical bronchiectasis, patchy tree-in-bud opacities and patchy nodular peribronchovascular and peripheral foci of consolidation. Follow-up high-resolution post treatment chest CT may be considered in 6-12 months. 2. Stable chronic mediastinal and right hilar adenopathy, most compatible with reactive adenopathy. 3. Two-vessel coronary atherosclerosis. 4. Nonspecific chronic intrahepatic and extrahepatic biliary ductal dilatation, not appreciably changed since 2,016 CT. Recommend correlation with serum bilirubin levels.  Aortic Atherosclerosis (ICD10-I70.0).   Electronically Signed   By: Ilona Sorrel M.D.   On: 03/28/2017 13:09   Result Notes for CT CHEST W CONTRAST   Notes  recorded by Roma Kayser, CMA on 03/29/2017 at 9:36 AM EST Ref and Rx put in. Routed to PCP to advise about Rx ------  Notes recorded by Ann Held, DO on 03/28/2017 at 1:24 PM EST ? Mycobacteria----- Bactrim ds 1 po bid x 10 days and refer to pulmonary

## 2017-04-02 ENCOUNTER — Ambulatory Visit: Payer: PPO | Admitting: Internal Medicine

## 2017-04-02 ENCOUNTER — Other Ambulatory Visit (INDEPENDENT_AMBULATORY_CARE_PROVIDER_SITE_OTHER): Payer: PPO

## 2017-04-02 ENCOUNTER — Encounter: Payer: Self-pay | Admitting: Internal Medicine

## 2017-04-02 VITALS — BP 142/68 | HR 66 | Ht 60.0 in | Wt 97.2 lb

## 2017-04-02 DIAGNOSIS — D638 Anemia in other chronic diseases classified elsewhere: Secondary | ICD-10-CM

## 2017-04-02 LAB — IBC PANEL
Iron: 17 ug/dL — ABNORMAL LOW (ref 42–145)
Saturation Ratios: 7.1 % — ABNORMAL LOW (ref 20.0–50.0)
Transferrin: 171 mg/dL — ABNORMAL LOW (ref 212.0–360.0)

## 2017-04-02 LAB — CBC WITH DIFFERENTIAL/PLATELET
Basophils Absolute: 0 10*3/uL (ref 0.0–0.1)
Basophils Relative: 0.2 % (ref 0.0–3.0)
Eosinophils Absolute: 0.2 10*3/uL (ref 0.0–0.7)
Eosinophils Relative: 1.8 % (ref 0.0–5.0)
HCT: 33.2 % — ABNORMAL LOW (ref 36.0–46.0)
Hemoglobin: 10.7 g/dL — ABNORMAL LOW (ref 12.0–15.0)
Lymphocytes Relative: 4.3 % — ABNORMAL LOW (ref 12.0–46.0)
Lymphs Abs: 0.5 10*3/uL — ABNORMAL LOW (ref 0.7–4.0)
MCHC: 32.2 g/dL (ref 30.0–36.0)
MCV: 91.6 fl (ref 78.0–100.0)
Monocytes Absolute: 0.9 10*3/uL (ref 0.1–1.0)
Monocytes Relative: 7.8 % (ref 3.0–12.0)
Neutro Abs: 10 10*3/uL — ABNORMAL HIGH (ref 1.4–7.7)
Neutrophils Relative %: 85.9 % — ABNORMAL HIGH (ref 43.0–77.0)
Platelets: 367 10*3/uL (ref 150.0–400.0)
RBC: 3.62 Mil/uL — ABNORMAL LOW (ref 3.87–5.11)
RDW: 14.2 % (ref 11.5–15.5)
WBC: 11.6 10*3/uL — ABNORMAL HIGH (ref 4.0–10.5)

## 2017-04-02 LAB — IRON: Iron: 17 ug/dL — ABNORMAL LOW (ref 42–145)

## 2017-04-02 LAB — FERRITIN: Ferritin: 271.4 ng/mL (ref 10.0–291.0)

## 2017-04-02 NOTE — Progress Notes (Signed)
[name] 72 y.o. June 18, 1945 [id number]  Assessment & Plan:   Encounter Diagnosis  Name Primary?  [name] Anemia of chronic disease Yes    I am going to check a ferritin and iron saturation today.  She shows signs of iron deficiency we could consider infusion.  She is not in good shape for any type of endoscopic workup.  I will see her back as needed.  I appreciate the opportunity to care for this patient. CC: Ann Held, DO   Subjective:   Chief Complaint: anemia  HPI The patient is here, she has a long-standing history of what I think is anemia of chronic disease.  Her hemoglobin went down about a gram recently to 10.6 and the been in the 11 range.  MCV is normal.  She is doing well otherwise with only about 3 episodes of partial small bowel obstruction in the last year, that is a good record for her.  She does not need any refills.  She said that her primary care provider suggested she get checked by me because the hemoglobin was down.  She denies any melena or rectal bleeding or any other signs of bleeding.  She is slowly recuperating from a pneumonia.  She remains on chronic oxygen therapy.  She does not take iron because it constipates her in fact I told her a year or 2 ago because her ferritin levels were adequate she did not need iron. Allergies  Allergen Reactions  . Dabigatran Etexilate Mesylate Other (See Comments)    INTERNAL BLEEDING-pradaxa  . Talwin [Pentazocine] Other (See Comments)    hallucinations  . Pentazocine Lactate Other (See Comments)    HALLUCINATIONS  . Lactate Rash  . Sulfonamide Derivatives Other (See Comments)    JITTERINESS    Current Meds  Medication Sig  . AMBULATORY NON FORMULARY MEDICATION Pt takes Oxygen, 2 units, every day  . carvedilol (COREG) 25 MG tablet TAKE 1 TABLET BY MOUTH TWICE DAILY  . denosumab (PROLIA) 60 MG/ML SOLN injection Inject 60 mg into the skin once. Administer in upper arm, thigh, or abdomen (Patient  taking differently: Inject 60 mg into the skin every 6 (six) months. Administer in upper arm, thigh, or abdomen)  . folic acid (FOLVITE) 1 MG tablet Take 1 mg by mouth daily.  . furosemide (LASIX) 40 MG tablet TAKE ONE TABLET BY MOUTH IN THE MORNING  . gabapentin (NEURONTIN) 600 MG tablet TAKE 1 TABLET BY MOUTH 4 TIMES DAILY  . methotrexate 2.5 MG tablet Take 5 mg by mouth once a week.   [name] MYRBETRIQ 25 MG TB24 tablet Take 1 tablet by mouth daily.  . nitroGLYCERIN (NITROSTAT) 0.4 MG SL tablet Place 1 tablet (0.4 mg total) under the tongue every 5 (five) minutes as needed. CHEST PAIN  . oxyCODONE-acetaminophen (PERCOCET) 10-325 MG tablet Take 1 tablet by mouth every 4 (four) hours as needed.  . pantoprazole (PROTONIX) 40 MG tablet TAKE 1 TABLET BY MOUTH TWICE DAILY  . promethazine (PHENERGAN) 25 MG tablet Take 1 tablet (25 mg total) by mouth every 8 (eight) hours as needed. for nausea  . Tiotropium Bromide Monohydrate (SPIRIVA RESPIMAT) 1.25 MCG/ACT AERS Inhale 2 puffs into the lungs daily.  [name] tiZANidine (ZANAFLEX) 4 MG tablet Take 1 tablet (4 mg total) by mouth 3 (three) times daily as needed. for muscle spams  . zolpidem (AMBIEN) 5 MG tablet TAKE 1 TABLET BY MOUTH ONCE DAILY AT BEDTIME AS NEEDED FOR  SLEEP   Past  medical history, past surgical history family history all reviewed in the EMR  Review of Systems As per HPI she has been weak and slowly recovering from this pneumonia  Objective:   Physical Exam BP (!) 142/68   Pulse 66   Ht 5' (1.524 m)   Wt 97 lb 3.2 oz (44.1 kg)   BMI 18.98 kg/m  Pleasant elderly chronically ill white woman in no acute distress

## 2017-04-02 NOTE — Patient Instructions (Signed)
  Your physician has requested that you go to the basement for the following lab work before leaving today: CBC/diff, TIBC, Ferritin and iron levels   I appreciate the opportunity to care for you. Silvano Rusk, MD, [facility]

## 2017-04-02 NOTE — Progress Notes (Signed)
Iron studies again c/w chronic disease Hgb stable  My Chart message to patient

## 2017-04-03 ENCOUNTER — Ambulatory Visit: Payer: PPO | Admitting: Pulmonary Disease

## 2017-04-03 ENCOUNTER — Encounter: Payer: Self-pay | Admitting: Pulmonary Disease

## 2017-04-03 VITALS — BP 98/78 | HR 72 | Ht 62.0 in | Wt 98.9 lb

## 2017-04-03 DIAGNOSIS — J471 Bronchiectasis with (acute) exacerbation: Secondary | ICD-10-CM

## 2017-04-03 DIAGNOSIS — J432 Centrilobular emphysema: Secondary | ICD-10-CM

## 2017-04-03 DIAGNOSIS — I4891 Unspecified atrial fibrillation: Secondary | ICD-10-CM | POA: Diagnosis not present

## 2017-04-03 DIAGNOSIS — R05 Cough: Secondary | ICD-10-CM | POA: Diagnosis not present

## 2017-04-03 DIAGNOSIS — J9611 Chronic respiratory failure with hypoxia: Secondary | ICD-10-CM | POA: Diagnosis not present

## 2017-04-03 DIAGNOSIS — J449 Chronic obstructive pulmonary disease, unspecified: Secondary | ICD-10-CM | POA: Diagnosis not present

## 2017-04-03 MED ORDER — FLUTTER DEVI: 1.0000 "application " | Freq: Four times a day (QID) | 0 refills | Status: DC | PRN

## 2017-04-03 NOTE — Progress Notes (Signed)
[facility]  Chief Complaint  Patient presents with  . Acute Visit    Pt feels pnuemonia is still there, pt is not feeling any better. Pt had blood work done yesterday by Dr. Jerl Santos MD; wbc is 11.6. Pt has productive cough not sure of color. Pt is on 3 liters of O2 con't; pt's breathing has worsen, SOB with exertion has increased. Pt has chest tightness and discomfort, and upper middle back pain    Vital signs: BP 98/78 (BP Location: Left Arm, Cuff Size: Normal)   Pulse 72   Ht 5\' 2"  (1.575 m)   Wt 98 lb 14.4 oz (44.9 kg)   SpO2 98%   BMI 18.09 kg/m   History of Present Illness: [name] is a 72 y.o. female former smoker with COPD/emphysema, chronic hypoxic respiratory failure, and recurrent pneumonia with focal bronchiectasis.  She started getting more fatigued and short of breath since November 2018.  She has also been bringing up more sputum.  This is thick, green.  She was seen by PCP.  Treated for pneumonia with ABx >> zithromax, rocephin, levaquin, doxycycline.  These improved her symptoms some, but not fully.  She then had CT chest that showed b/l bronchiectasis with tree in bud, and nodular opacities.  She has been on MTX for plaque psoriasis.  She had to increase her oxygen to 3 liters.  Physical Exam:  General - pale, wearing oxygen, thin Eyes - pupils reactive ENT - no sinus tenderness, no oral exudate, no LAN Cardiac - regular, no murmur Chest - b/l faint crackles, no wheeze Abd - soft, non tender Ext - no edema Skin - no rashes Neuro - normal strength Psych - normal mood   CMP Latest Ref Rng & Units 03/21/2017 03/07/2017 12/18/2016  Glucose 70 - 99 mg/dL 95 80 -  BUN 6 - 23 mg/dL 17 21 -  Creatinine 0.40 - 1.20 mg/dL 1.01 1.03 -  Sodium 135 - 145 mEq/L 139 138 -  Potassium 3.5 - 5.1 mEq/L 4.5 4.3 -  Chloride 96 - 112 mEq/L 97 95(L) -  CO2 19 - 32 mEq/L 37(H) 34(H) -  Calcium 8.4 - 10.5 mg/dL 9.0 9.3 -  Total  Protein 6.0 - 8.3 g/dL 6.7 7.9 7.0  Total Bilirubin 0.2 - 1.2 mg/dL 0.4 0.5 0.4  Alkaline Phos 39 - 117 U/L 201(H) 116 90  AST 0 - 37 U/L 34 17 21  ALT 0 - 35 U/L 54(H) 8 13    CBC Latest Ref Rng & Units 04/02/2017 03/21/2017 03/07/2017  WBC 4.0 - 10.5 K/uL 11.6(H) 5.7 11.4(H)  Hemoglobin 12.0 - 15.0 g/dL 10.7(L) 10.6(L) 11.6(L)  Hematocrit 36.0 - 46.0 % 33.2(L) 32.2(L) 36.4  Platelets 150.0 - 400.0 K/uL 367.0 256.0 307.0     Discussion: She has progression of bronchiectasis.  Her CT chest appearance is now more concern for atypical infection such as MAI rather than from recurrent aspiration that was causing regional bronchiectasis before.  Of note is that she is on methotrexate for psoriasis, and this certain could impact her immune system and set her up for atypical infections.  Assessment/Plan:  Bronchiectasis with concern for MAI. - will arrange for sputum sampling for culture, AFB, and fungal culture - if these results are not conclusive, will then need to consider bronchoscopy - will have her try mucinex and flutter valve - defer further Abx until culture results are available to limit chance of developing resistance  COPD  with emphysema. - continue spiriva  Chronic respiratory failure with hypoxia. - goal SpO2 > 90% - she uses 2 to 3 liters oxygen 24/7 - will have her DME arrange for a portable pulse oximeter  Plaque psoriasis. - advised her to d/w Dr. Renda Rolls with dermatology about an alternative to methotrexate   Recurrent SBO. - f/u with Dr. Carlean Purl with GI   Patient Instructions  Will arrange for flutter valve Can use mucinex to help loosen phlegm Will arrange for sputum for culture, AFB, and fungal culture  Speak with Dr. Renda Rolls about stopping Methotrexate  Follow up in 3 to 4 weeks    Chesley Mires, MD [place] 04/03/2017, 12:49 PM Pager:  [phone]  Flow Sheet  Pulmonary tests: PFT 06/28/08 >> FEV1 1.95 (97%), FEV1%  70, TLC 3.88 (87%), DLCO 64% ONO 02/11/10 >> Test time 9 hrs 9 min, Mean SpO2 87%, low SpO2 80%, Spent 5hrs 57 min (65%) with SpO2 < 88%. PFT 10/10/11 >> FEV1 1.15 (59%), FEV1% 64, TLC 3.26 (73%), DLCO 35%, + BD CT chest 04/30/2011 >> Chronic multifocal nodular and patchy airspace densities throughout the right lung with mild BTX CT chest 03/28/17 >> mild/mod cylindrical BTX diffuse and b/l, tree in bud, nodular infiltrates  Cardiac tests Echo 05/18/14 >> EF 60 to 65%, grade 1 diastolic dysfx, PAS 30 mmHg  Past Medical History: She  has a past medical history of Anemia, Anginal pain ([place]), Arthritis, Atrial fibrillation ([place]), Blood transfusion, CAD (coronary artery disease), CHF (congestive heart failure) ([place]), Chronic abdominal pain, Chronic nausea, Chronic pain syndrome, COPD (chronic obstructive pulmonary disease) ([place]), Diverticulitis, Elbow fracture, left (aug 2012), Elevated liver enzymes, Fibromyalgia, Gastroparesis, GERD (gastroesophageal reflux disease), H/O: GI bleed, Hepatitis, HTN (hypertension), MRSA infection, gastric ulcer, stress fx (aug 2011), LBBB (left bundle branch block), Oxygen dependent, Pacemaker, Pleural effusion, Pneumonia (- 03/09, 12/09, 02/10, 06/10), Primary dilated cardiomyopathy ([place]), RLS (restless legs syndrome), Silent aspiration, Small bowel obstruction ([place]), Urinary incontinence, Venous embolism and thrombosis of subclavian vein ([place]), and Vitamin B12 deficiency.  Past Surgical History: She  has a past surgical history that includes Appendectomy; pacemaker placement (12/21/2009); Peg removed; Tonsillectomy (age 62); Interstim implant revision (03/06/2011); Portacath placement (12/16/2009); Total abdominal hysterectomy; Interstim Implant placement (05/28/2006 - stage I); Interstim implant revision (10/23/2007); Cystoscopy with injection (04/30/2006); Small intestine surgery (05/20/2001); Cardiac catheterization (04/08/2006, 11/16/2009); Cystoscopy (11/11/2008); Cystoscopy w/  retrogrades (11/11/2008); Colectomy; Colectomy (02/04/2000); Gastrocutaneous fistula closure; Exploratory laparotomy (04/27/2009); Port-a-cath removal (07/27/2011); Total hip arthroplasty (08/03/2011); Interstim Implant placement (02/05/2012); Esophagogastroduodenoscopy (N/A, 02/02/2013); and Savory dilation (N/A, 02/02/2013).  Family History: Her family history includes Breast cancer in her maternal aunt; Diabetes in her paternal grandfather; Heart disease in her brother, father, mother, and paternal aunt; Stroke in her mother.  Social History: She  reports that she quit smoking about 31 years ago. She has a 20.00 pack-year smoking history. she has never used smokeless tobacco. She reports that she does not drink alcohol or use drugs.  Medications: Allergies as of 04/03/2017      Reactions   Dabigatran Etexilate Mesylate Other (See Comments)   INTERNAL BLEEDING-pradaxa   Talwin [pentazocine] Other (See Comments)   hallucinations   Pentazocine Lactate Other (See Comments)   HALLUCINATIONS   Lactate Rash   Sulfonamide Derivatives Other (See Comments)   JITTERINESS      Medication List        Accurate as of 04/03/17 12:49 PM. Always use your most recent med list.  AMBULATORY NON FORMULARY MEDICATION Pt takes Oxygen, 2 units, every day   carvedilol 25 MG tablet Commonly known as:  COREG TAKE 1 TABLET BY MOUTH TWICE DAILY   denosumab 60 MG/ML Soln injection Commonly known as:  PROLIA Inject 60 mg into the skin once. Administer in upper arm, thigh, or abdomen   FLUTTER Devi 1 application by Does not apply route 4 (four) times daily as needed.   folic acid 1 MG tablet Commonly known as:  FOLVITE Take 1 mg by mouth daily.   furosemide 40 MG tablet Commonly known as:  LASIX TAKE ONE TABLET BY MOUTH IN THE MORNING   gabapentin 600 MG tablet Commonly known as:  NEURONTIN TAKE 1 TABLET BY MOUTH 4 TIMES DAILY   methotrexate 2.5 MG tablet Take 5 mg by mouth once a week.    MYRBETRIQ 25 MG Tb24 tablet Generic drug:  mirabegron ER Take 1 tablet by mouth daily.   nitroGLYCERIN 0.4 MG SL tablet Commonly known as:  NITROSTAT Place 1 tablet (0.4 mg total) under the tongue every 5 (five) minutes as needed. CHEST PAIN   oxyCODONE-acetaminophen 10-325 MG tablet Commonly known as:  PERCOCET Take 1 tablet by mouth every 4 (four) hours as needed.   pantoprazole 40 MG tablet Commonly known as:  PROTONIX TAKE 1 TABLET BY MOUTH TWICE DAILY   promethazine 25 MG tablet Commonly known as:  PHENERGAN Take 1 tablet (25 mg total) by mouth every 8 (eight) hours as needed. for nausea   Tiotropium Bromide Monohydrate 1.25 MCG/ACT Aers Commonly known as:  SPIRIVA RESPIMAT Inhale 2 puffs into the lungs daily.   tiZANidine 4 MG tablet Commonly known as:  ZANAFLEX Take 1 tablet (4 mg total) by mouth 3 (three) times daily as needed. for muscle spams   zolpidem 5 MG tablet Commonly known as:  AMBIEN TAKE 1 TABLET BY MOUTH ONCE DAILY AT BEDTIME AS NEEDED FOR  SLEEP

## 2017-04-03 NOTE — Patient Instructions (Addendum)
Will arrange for flutter valve Can use mucinex to help loosen phlegm Will arrange for sputum for culture, AFB, and fungal culture  Speak with Dr. Renda Rolls about stopping Methotrexate  Follow up in 3 to 4 weeks

## 2017-04-04 ENCOUNTER — Ambulatory Visit: Payer: PPO | Admitting: Family Medicine

## 2017-04-04 ENCOUNTER — Other Ambulatory Visit: Payer: PPO

## 2017-04-04 DIAGNOSIS — J471 Bronchiectasis with (acute) exacerbation: Secondary | ICD-10-CM | POA: Diagnosis not present

## 2017-04-05 ENCOUNTER — Other Ambulatory Visit: Payer: PPO

## 2017-04-05 DIAGNOSIS — J470 Bronchiectasis with acute lower respiratory infection: Secondary | ICD-10-CM | POA: Diagnosis not present

## 2017-04-07 LAB — RESPIRATORY CULTURE OR RESPIRATORY AND SPUTUM CULTURE
MICRO NUMBER:: 90072452
RESULT:: NORMAL
SPECIMEN QUALITY:: ADEQUATE

## 2017-04-10 ENCOUNTER — Telehealth: Payer: Self-pay | Admitting: Family Medicine

## 2017-04-10 DIAGNOSIS — M810 Age-related osteoporosis without current pathological fracture: Secondary | ICD-10-CM

## 2017-04-10 NOTE — Telephone Encounter (Signed)
[employer] benefits received PA is required 20% co-insurance for Admin/[employer] $10 copay with OV   Patient may owe approximately $220 OOP  Patient due after 09/18/16  Routed to [facility] for PA  Letter mailed to inform patient of benefits and to schedule

## 2017-04-10 NOTE — Telephone Encounter (Signed)
PA initiated via Covermymeds; KEY: [id number]. Awaiting determination.

## 2017-04-11 MED ORDER — DENOSUMAB 60 MG/ML ~~LOC~~ SOLN: 60.0000 mg | SUBCUTANEOUS | 1 refills | Status: DC

## 2017-04-11 NOTE — Telephone Encounter (Signed)
PA is already being covered under Pt's plan but must be sent to Pt's pharmacy. Rx sent to [facility]. MyChart message sent to Pt informing her that prescription has been sent to pharmacy and how to handle medication (placing in fridge immediately), instructed her to then call office to schedule nurse visit at her convenience.

## 2017-04-12 DIAGNOSIS — J449 Chronic obstructive pulmonary disease, unspecified: Secondary | ICD-10-CM | POA: Diagnosis not present

## 2017-04-12 DIAGNOSIS — I4891 Unspecified atrial fibrillation: Secondary | ICD-10-CM | POA: Diagnosis not present

## 2017-04-12 DIAGNOSIS — R05 Cough: Secondary | ICD-10-CM | POA: Diagnosis not present

## 2017-04-15 ENCOUNTER — Ambulatory Visit (INDEPENDENT_AMBULATORY_CARE_PROVIDER_SITE_OTHER): Payer: PPO | Admitting: *Deleted

## 2017-04-15 ENCOUNTER — Telehealth: Payer: Self-pay | Admitting: Cardiology

## 2017-04-15 DIAGNOSIS — I428 Other cardiomyopathies: Secondary | ICD-10-CM

## 2017-04-15 DIAGNOSIS — I5022 Chronic systolic (congestive) heart failure: Secondary | ICD-10-CM

## 2017-04-15 NOTE — Telephone Encounter (Signed)
Spoke with pt and reminded pt of remote transmission that is due today. Pt verbalized understanding.   

## 2017-04-16 NOTE — Progress Notes (Signed)
Remote pacemaker transmission.   

## 2017-04-17 ENCOUNTER — Encounter: Payer: Self-pay | Admitting: Pulmonary Disease

## 2017-04-17 ENCOUNTER — Encounter: Payer: Self-pay | Admitting: Cardiology

## 2017-04-17 LAB — CUP PACEART REMOTE DEVICE CHECK
Battery Remaining Longevity: 24 mo
Battery Voltage: 2.91 V
Brady Statistic AP VP Percent: 3.79 %
Brady Statistic AP VS Percent: 0.01 %
Brady Statistic AS VP Percent: 96.16 %
Brady Statistic AS VS Percent: 0.04 %
Brady Statistic RA Percent Paced: 3.77 %
Brady Statistic RV Percent Paced: 99.95 %
Date Time Interrogation Session: 20190129041110
Implantable Lead Implant Date: 20111005
Implantable Lead Implant Date: 20111005
Implantable Lead Implant Date: 20111005
Implantable Lead Location: 753858
Implantable Lead Location: 753859
Implantable Lead Location: 753860
Implantable Lead Model: 4196
Implantable Lead Model: 5076
Implantable Lead Model: 5076
Implantable Pulse Generator Implant Date: 20111005
Lead Channel Impedance Value: 304 Ohm
Lead Channel Impedance Value: 361 Ohm
Lead Channel Impedance Value: 437 Ohm
Lead Channel Impedance Value: 437 Ohm
Lead Channel Impedance Value: 475 Ohm
Lead Channel Impedance Value: 513 Ohm
Lead Channel Impedance Value: 513 Ohm
Lead Channel Impedance Value: 513 Ohm
Lead Channel Impedance Value: 722 Ohm
Lead Channel Pacing Threshold Amplitude: 0.5 V
Lead Channel Pacing Threshold Amplitude: 0.625 V
Lead Channel Pacing Threshold Pulse Width: 0.4 ms
Lead Channel Pacing Threshold Pulse Width: 0.4 ms
Lead Channel Sensing Intrinsic Amplitude: 1.375 mV
Lead Channel Sensing Intrinsic Amplitude: 1.375 mV
Lead Channel Sensing Intrinsic Amplitude: 13 mV
Lead Channel Sensing Intrinsic Amplitude: 7.625 mV
Lead Channel Setting Pacing Amplitude: 1.25 V
Lead Channel Setting Pacing Amplitude: 2 V
Lead Channel Setting Pacing Amplitude: 2.5 V
Lead Channel Setting Pacing Pulse Width: 0.4 ms
Lead Channel Setting Pacing Pulse Width: 0.8 ms
Lead Channel Setting Sensing Sensitivity: 0.9 mV

## 2017-04-18 ENCOUNTER — Telehealth: Payer: Self-pay | Admitting: Pulmonary Disease

## 2017-04-18 NOTE — Telephone Encounter (Signed)
SG can you advise for VS. Sputum culture results and if she should be cautious about being around other people?

## 2017-04-18 NOTE — Telephone Encounter (Signed)
Have you gotten results from Sputum test? Also, I still have this dry hacking cough where I am not able to get anything up.   Thank you     I forgot to ask earlier, am I at more risk being around crowds such as the grocery store, church, etc?     VS please advise. Thanks

## 2017-04-18 NOTE — Telephone Encounter (Signed)
Preliminary culture is negative The final culture will not be final for afew weeks. She should be fine though.

## 2017-04-19 NOTE — Telephone Encounter (Signed)
Spoke with pt. She is aware of results. Nothing further was needed.  

## 2017-04-22 ENCOUNTER — Ambulatory Visit: Payer: PPO | Admitting: Adult Health

## 2017-04-22 ENCOUNTER — Encounter: Payer: Self-pay | Admitting: Adult Health

## 2017-04-22 DIAGNOSIS — J432 Centrilobular emphysema: Secondary | ICD-10-CM | POA: Diagnosis not present

## 2017-04-22 DIAGNOSIS — J471 Bronchiectasis with (acute) exacerbation: Secondary | ICD-10-CM | POA: Diagnosis not present

## 2017-04-22 DIAGNOSIS — J9611 Chronic respiratory failure with hypoxia: Secondary | ICD-10-CM

## 2017-04-22 DIAGNOSIS — J479 Bronchiectasis, uncomplicated: Secondary | ICD-10-CM | POA: Insufficient documentation

## 2017-04-22 NOTE — Assessment & Plan Note (Signed)
For now continue on Spiriva.  Going forward if continues to have worsening symptoms may consider changing to LABA [place]  Patient Instructions  Continue on Spiriva daily .  Mucinex Twice daily   Flutter valve Three times a day  .  Continue on Oxygen 3l/m  I will be in touch regarding upcoming tests.  follow up Dr. Halford Chessman in 4-6  weeks and As needed   Please contact office for sooner follow up if symptoms do not improve or worsen or seek emergency care

## 2017-04-22 NOTE — Patient Instructions (Addendum)
Continue on Spiriva daily .  Mucinex Twice daily   Flutter valve Three times a day  .  Continue on Oxygen 3l/m  I will be in touch regarding upcoming tests.  follow up Dr. Halford Chessman in 4-6  weeks and As needed   Please contact office for sooner follow up if symptoms do not improve or worsen or seek emergency care

## 2017-04-22 NOTE — Assessment & Plan Note (Signed)
Continue on oxygen 

## 2017-04-22 NOTE — Progress Notes (Signed)
_0  ID: [name], female    DOB: 31-Dec-1945, 72 y.o.   MRN: [id number]  Chief Complaint  Patient presents with  . Follow-up    Referring provider: Ann Held, *  HPI: 72 year old female former smoker followed for COPD, emphysema, chronic hypoxic respiratory failure on oxygen, recurrent pneumonia with bronchiectasis Plaque psoriasis on methotrexate- stopped 03/2017   Pulmonary tests: PFT4/12/10>>FEV1 1.95 (97%), FEV1% 70, TLC 3.88 (87%), DLCO 64% ONO 02/11/10 >> Test time 9 hrs 9 min, Mean SpO2 87%, low SpO2 80%, Spent 5hrs 57 min (65%) with SpO2 < 88%. PFT7/24/13>> FEV1 1.15 (59%), FEV1% 64, TLC 3.26 (73%), DLCO 35%, + BD CT chest 04/30/2011 >> Chronic multifocal nodular and patchy airspace densities throughout the right lung with mild BTX CT chest 03/28/17 >> mild/mod cylindrical BTX diffuse and b/l, tree in bud, nodular infiltrates  Cardiac tests Echo 05/18/14 >> EF 60 to 70%, grade 1 diastolic dysfx, PAS 30 mmHg  04/22/2017 Follow up ; COPD , O2 RF  and bronchiectasis Patient presents for a 2-week follow-up.  Patient was seen last visit with a slow progression of worsening symptoms over the last 3 months of fatigue cough, congestion and recurrent infections.  She been treated with several antibiotics with only minimal or short term improvement.  A recent CT chest done on January 10 showed mild to moderate bronchiectasis with tree-in-bud nodular infiltrates concerning for MAI.   a sputum culture and sputum for AFB was sent.  Results have been negative to date.  Patient's methotrexate was stopped.  Patient returns today.  She says she seen no significant change in her symptoms she continues to have a cough that is minimally productive.  She is using flutter flutter valve on occasion. She remains on Spiriva daily.  She is on oxygen 2-3 L. She denies any hemoptysis, fever, chest pain, orthopnea or edema .       Allergies  Allergen Reactions  . Dabigatran  Etexilate Mesylate Other (See Comments)    INTERNAL BLEEDING-pradaxa  . Talwin [Pentazocine] Other (See Comments)    hallucinations  . Pentazocine Lactate Other (See Comments)    HALLUCINATIONS  . Lactate Rash  . Sulfonamide Derivatives Other (See Comments)    JITTERINESS     Immunization History  Administered Date(s) Administered  . DTaP 11/03/1996, 01/04/1997, 05/03/1997, 10/19/1997, 08/05/2001  . HPV Quadrivalent 09/05/2009, 09/13/2010, 09/26/2011  . Hepatitis A, Ped/Adol-2 Dose 09/21/2004, 03/29/2005  . Hepatitis B, ped/adol 04/30/1996, 11/03/1996, 10/19/1997  . HiB (PRP-T) 11/03/1996, 01/04/1997, 05/03/1997, 10/19/1997  . IPV 11/03/1996, 01/04/1997, 05/03/1997, 08/05/2001  . Influenza Split 01/16/2011, 12/12/2011  . Influenza Whole 01/15/2007, 12/08/2007, 12/28/2008, 12/17/2009  . Influenza, High Dose Seasonal PF 11/28/2015, 12/07/2016  . Influenza, Seasonal, Injecte, Preservative Fre 12/09/2012  . Influenza,Quad,Nasal, Live 03/17/2007, 03/06/2009, 01/08/2010, 01/30/2011, 02/09/2012, 01/18/2013  . Influenza,inj,Quad PF,6+ Mos 03/29/2005, 04/30/2005, 03/01/2006, 01/28/2008, 12/01/2013, 01/16/2014, 12/22/2014, 12/28/2014  . MMR 05/03/1997, 08/05/2001  . Meningococcal B, OMV 12/22/2014  . Pneumococcal Conjugate-13 06/10/2014, 11/15/2014  . Pneumococcal Polysaccharide-23 12/17/2009  . Tdap 09/17/2007, 05/15/2014  . Varicella 11/16/1997, 05/18/2008  . Zoster 12/31/2013    Past Medical History:  Diagnosis Date  . Anemia   . Anginal pain ([place])   . Arthritis    right hip  . Atrial fibrillation ([place])   . Blood transfusion   . CAD (coronary artery disease)    mild disease per cath in 2011  . CHF (congestive heart failure) ([place])   . Chronic abdominal pain   .  Chronic nausea   . Chronic pain syndrome   . COPD (chronic obstructive pulmonary disease) ([facility])    continuous O2- 2l  . Diverticulitis   . Elbow fracture, left aug 2012  . Elevated liver enzymes   . Fibromyalgia      COUPLE OF TIMES A YEAR-PT HAS EPISODES OF CONFUSION-USUALLY INVOLVES DAY/NIGHT REVERSAL AND EPISODES OF TWITCHING AND FALLS--SEES DR. Jacelyn Grip - NEUROLOGIST-LAST SEEN 07/10/11  . Gastroparesis   . GERD (gastroesophageal reflux disease)   . H/O: GI bleed    from Pradaxa  . Hepatitis    hx of in high school   . HTN (hypertension)    under control; has been on med. x "years"  . Hx MRSA infection   . Hx of gastric ulcer   . Hx of stress fx aug 2011   right hip   . LBBB (left bundle branch block)   . Oxygen dependent    2 liters via nasal cannula at all times  . Pacemaker    CRT therapy; followed by Dr. Caryl Comes  . Pleural effusion     s/p right thoracentesis 03/09  . Pneumonia - 03/09, 12/09, 02/10, 06/10   MOST RECENT FEB 2013  . Primary dilated cardiomyopathy ([facility])    EF 45 to 50% per echo in Jan 2012  . RLS (restless legs syndrome)   . Silent aspiration   . Small bowel obstruction ([place])   . Urinary incontinence     -INTERSTIM IMPLANT NOT FUNCTIONING PER PT  . Venous embolism and thrombosis of subclavian vein ([place])    after pacemaker insertion Oct 2011  . Vitamin B12 deficiency     Tobacco History: Social History   Tobacco Use  Smoking Status Former Smoker  . Packs/day: 1.00  . Years: 20.00  . Pack years: 20.00  . Last attempt to quit: 03/19/1986  . Years since quitting: 31.1  Smokeless Tobacco Never Used   Counseling given: Not Answered   Outpatient Encounter Medications as of 04/22/2017  Medication Sig  . AMBULATORY NON FORMULARY MEDICATION Pt takes Oxygen, 2 units, every day  . carvedilol (COREG) 25 MG tablet TAKE 1 TABLET BY MOUTH TWICE DAILY  . denosumab (PROLIA) 60 MG/ML SOLN injection Inject 60 mg into the skin every 6 (six) months. Administer in upper arm, thigh, or abdomen  . furosemide (LASIX) 40 MG tablet TAKE ONE TABLET BY MOUTH IN THE MORNING  . gabapentin (NEURONTIN) 600 MG tablet TAKE 1 TABLET BY MOUTH 4 TIMES DAILY  . MYRBETRIQ 25 MG TB24 tablet Take 1  tablet by mouth daily.  [name] oxyCODONE-acetaminophen (PERCOCET) 10-325 MG tablet Take 1 tablet by mouth every 4 (four) hours as needed.  . pantoprazole (PROTONIX) 40 MG tablet TAKE 1 TABLET BY MOUTH TWICE DAILY  . promethazine (PHENERGAN) 25 MG tablet Take 1 tablet (25 mg total) by mouth every 8 (eight) hours as needed. for nausea  . Respiratory Therapy Supplies (FLUTTER) DEVI 1 application by Does not apply route 4 (four) times daily as needed.  . Tiotropium Bromide Monohydrate (SPIRIVA RESPIMAT) 1.25 MCG/ACT AERS Inhale 2 puffs into the lungs daily.  [name] tiZANidine (ZANAFLEX) 4 MG tablet Take 1 tablet (4 mg total) by mouth 3 (three) times daily as needed. for muscle spams  . triamcinolone cream (KENALOG) 0.1 % Apply 1 application topically 2 (two) times daily.  [name] zolpidem (AMBIEN) 5 MG tablet TAKE 1 TABLET BY MOUTH ONCE DAILY AT BEDTIME AS NEEDED FOR  SLEEP  . folic acid (FOLVITE) 1  MG tablet Take 1 mg by mouth daily.  . nitroGLYCERIN (NITROSTAT) 0.4 MG SL tablet Place 1 tablet (0.4 mg total) under the tongue every 5 (five) minutes as needed. CHEST PAIN (Patient not taking: Reported on 04/22/2017)  . [DISCONTINUED] iron dextran complex (INFED) 50 MG/ML injection Please give infed infusion (no test dose needed) over 4 hours per pharmacy calculated dose. Ht: 5'2 Wt: 99 pounds Hgb: 12  . [DISCONTINUED] methotrexate 2.5 MG tablet Take 5 mg by mouth once a week.    No facility-administered encounter medications on file as of 04/22/2017.      Review of Systems  Constitutional:   No  weight loss, night sweats,  Fevers, chills, + fatigue, or  lassitude.  HEENT:   No headaches,  Difficulty swallowing,  Tooth/dental problems, or  Sore throat,                No sneezing, itching, ear ache,  +nasal congestion, post nasal drip,   CV:  No chest pain,  Orthopnea, PND, swelling in lower extremities, anasarca, dizziness, palpitations, syncope.   GI  No heartburn, indigestion, abdominal pain, nausea,  vomiting, diarrhea, change in bowel habits, loss of appetite, bloody stools.   Resp:    No chest wall deformity  Skin: no rash or lesions.  GU: no dysuria, change in color of urine, no urgency or frequency.  No flank pain, no hematuria   MS:  No joint pain or swelling.  No decreased range of motion.  No back pain.    Physical Exam  BP 118/70 (BP Location: Left Arm, Cuff Size: Normal)   Pulse 74   Ht 5' (1.524 m)   Wt 100 lb 6.4 oz (45.5 kg)   SpO2 100%   BMI 19.61 kg/m   GEN: A/Ox3; pleasant , NAD, thin and frail   HEENT:  [place]/AT,  EACs-clear, TMs-wnl, NOSE-clear, THROAT-clear, no lesions, no postnasal drip or exudate noted.   NECK:  Supple w/ fair ROM; no JVD; normal carotid impulses w/o bruits; no thyromegaly or nodules palpated; no lymphadenopathy.    RESP few trace rhonchi no accessory muscle use, no dullness to percussion  CARD:  RRR, no m/r/g, no peripheral edema, pulses intact, no cyanosis or clubbing.  GI:   Soft & nt; nml bowel sounds; no organomegaly or masses detected.   Musco: Warm bil, no deformities or joint swelling noted.   Neuro: alert, no focal deficits noted.    Skin: Warm, no lesions or rashes    Lab Results:  CBC   BMET  BNP No results found for: BNP  ProBNP  Imaging: Ct Chest W Contrast  Result Date: 03/28/2017 CLINICAL DATA:  Possible new right lower lung pulmonary nodule on recent chest radiograph. EXAM: CT CHEST WITH CONTRAST TECHNIQUE: Multidetector CT imaging of the chest was performed during intravenous contrast administration. CONTRAST:  25m ISOVUE-300 IOPAMIDOL (ISOVUE-300) INJECTION 61% COMPARISON:  03/21/2017 chest radiograph.  05/04/2011 chest CT. FINDINGS: Cardiovascular: Normal heart size. No significant pericardial fluid/thickening. Three lead left subclavian pacemaker is noted with lead tips in right atrium, right ventricle and coronary sinus. Left anterior descending and left circumflex coronary atherosclerosis.  Atherosclerotic nonaneurysmal thoracic aorta. Normal caliber pulmonary arteries. No central pulmonary emboli. Mediastinum/Nodes: No discrete thyroid nodules. Unremarkable esophagus. No axillary adenopathy. Mild right paratracheal adenopathy up to the 1.0 cm (series 2/image 25) is stable since 05/04/2011. Mildly enlarged 1.1 cm subcarinal node (series 2/image 57) is stable. Mild right hilar adenopathy up to 1.0 cm (series 2/image 54)  appears stable compared to the noncontrast 05/04/2011 chest CT. No pathologically enlarged left hilar nodes. Lungs/Pleura: No pneumothorax. No pleural effusion. There is mild-to-moderate cylindrical bronchiectasis extensively distributed throughout both lungs involving all lung lobes, asymmetrically prominent in the right lung, with associated diffuse bronchial wall thickening. There is associated prominent patchy tree-in-bud opacity throughout both lungs at the areas of bronchiectasis, involving the right greater than left lungs. There are patchy nodular confluent foci of consolidation throughout the peribronchovascular and peripheral lungs bilaterally, asymmetrically prominent in the right lung. These findings are chronic and have progressed significantly since 05/04/2011 chest CT. Several of nodular foci of consolidation are new/increased, particularly in basilar lower lobes, right greater than left. Representative new 1.0 cm peribronchovascular right middle lobe nodular opacity (series 3/image 59). Upper abdomen: Simple 1.0 cm cystic central splenic lesion, compatible with a benign splenic lymphangioma. Mild diffuse intrahepatic biliary ductal dilatation with dilated visualized proximal common bile duct (8 mm diameter), not definitely changed since 07/29/2014 noncontrast CT abdomen study. Musculoskeletal: No aggressive appearing focal osseous lesions. Mild-to-moderate thoracic spondylosis. IMPRESSION: 1. Significant interval progression of spectrum of findings suggestive of chronic  infectious bronchiolitis due to atypical mycobacterial infection (MAI), asymmetrically involving the right lung. Diffuse cylindrical bronchiectasis, patchy tree-in-bud opacities and patchy nodular peribronchovascular and peripheral foci of consolidation. Follow-up high-resolution post treatment chest CT may be considered in 6-12 months. 2. Stable chronic mediastinal and right hilar adenopathy, most compatible with reactive adenopathy. 3. Two-vessel coronary atherosclerosis. 4. Nonspecific chronic intrahepatic and extrahepatic biliary ductal dilatation, not appreciably changed since 2,016 CT. Recommend correlation with serum bilirubin levels. Aortic Atherosclerosis (ICD10-I70.0). Electronically Signed   By: Ilona Sorrel M.D.   On: 03/28/2017 13:09     Assessment & Plan:   No problem-specific Assessment & Plan notes found for this encounter.     Rexene Edison, NP 04/22/2017

## 2017-04-22 NOTE — Assessment & Plan Note (Signed)
Recurrent flares with tree-in-bud nodular changes on CT chest concerning for MAI.  Sputum cultures and sputum AFB has been nondiagnostic to date.  Will discuss case with Dr.Sood as may need to proceed with bronchoscopy with BAL /AFB cx .  Looking for MAI and atypical infections as patient is [place] been on methotrexate.  Patient is now off of methotrexate.  We will continue to monitor closely.  Have patient return back here in 6-8 weeks.  Will contact patient once I discussed case with Dr. Halford Chessman

## 2017-04-26 ENCOUNTER — Ambulatory Visit: Payer: PPO | Admitting: Pulmonary Disease

## 2017-05-07 ENCOUNTER — Telehealth: Payer: Self-pay | Admitting: Pulmonary Disease

## 2017-05-07 DIAGNOSIS — Z23 Encounter for immunization: Secondary | ICD-10-CM | POA: Diagnosis not present

## 2017-05-07 DIAGNOSIS — L400 Psoriasis vulgaris: Secondary | ICD-10-CM | POA: Diagnosis not present

## 2017-05-07 NOTE — Telephone Encounter (Signed)
Spoke with patient.  Her cough is improving and she is not having as much sputum.  She feels this relates to when she was taken off MTX.  Will defer bronchoscopy for now, and reassess at [facility] on 05/28/17.

## 2017-05-08 ENCOUNTER — Encounter: Payer: Self-pay | Admitting: Internal Medicine

## 2017-05-13 DIAGNOSIS — J449 Chronic obstructive pulmonary disease, unspecified: Secondary | ICD-10-CM | POA: Diagnosis not present

## 2017-05-13 DIAGNOSIS — I4891 Unspecified atrial fibrillation: Secondary | ICD-10-CM | POA: Diagnosis not present

## 2017-05-13 DIAGNOSIS — R05 Cough: Secondary | ICD-10-CM | POA: Diagnosis not present

## 2017-05-15 ENCOUNTER — Encounter: Payer: Self-pay | Admitting: Internal Medicine

## 2017-05-15 ENCOUNTER — Ambulatory Visit: Payer: PPO | Admitting: Internal Medicine

## 2017-05-15 VITALS — BP 126/80 | HR 73 | Ht 60.0 in | Wt 101.4 lb

## 2017-05-15 DIAGNOSIS — I480 Paroxysmal atrial fibrillation: Secondary | ICD-10-CM

## 2017-05-15 DIAGNOSIS — I5022 Chronic systolic (congestive) heart failure: Secondary | ICD-10-CM

## 2017-05-15 DIAGNOSIS — I428 Other cardiomyopathies: Secondary | ICD-10-CM | POA: Diagnosis not present

## 2017-05-15 DIAGNOSIS — Z950 Presence of cardiac pacemaker: Secondary | ICD-10-CM

## 2017-05-15 MED ORDER — NITROGLYCERIN 0.4 MG SL SUBL: 0.4000 mg | SUBLINGUAL_TABLET | SUBLINGUAL | 5 refills | Status: DC | PRN

## 2017-05-15 NOTE — Patient Instructions (Addendum)
Medication Instructions:  Your physician recommends that you continue on your current medications as directed. Please refer to the Current Medication list given to you today.  Labwork: None ordered.  Testing/Procedures: None ordered.  Follow-Up: Your physician recommends that you schedule a follow-up appointment in: One year with Chanetta Marshall, PA  Remote monitoring is used to monitor your Pacemaker from home. This monitoring reduces the number of office visits required to check your device to one time per year. It allows [place] to keep an eye on the functioning of your device to ensure it is working properly. You are scheduled for a device check from home on 06/17/2017. You may send your transmission at any time that day. If you have a wireless device, the transmission will be sent automatically. After your physician reviews your transmission, you will receive a postcard with your next transmission date.    Any Other Special Instructions Will Be Listed Below (If Applicable).     If you need a refill on your cardiac medications before your next appointment, please call your pharmacy.

## 2017-05-15 NOTE — Progress Notes (Signed)
Patient Care Team: Carollee Herter, Alferd Apa, DO as PCP - General Rolm Bookbinder, MD as Consulting Physician (General Surgery) Lafayette Dragon, MD (Inactive) as Consulting Physician (Gastroenterology) Gatha Mayer, MD as Consulting Physician (Gastroenterology) Rutherford Guys, MD as Consulting Physician (Ophthalmology) Crista Luria, MD as Consulting Physician (Dermatology) Deboraha Sprang, MD as Consulting Physician (Cardiology) Minus Breeding, MD as Consulting Physician (Cardiology)   HPI  [name] is a 72 y.o. female seen in followup for congestive heart failure in the setting of nonischemic cardiomyopathy. She underwent CRT-P implantation October 2011. This has been associated with significant improvement although complicated by left upper extremity deep venous thrombosis. She also has infrequent diaphragmatic stimulation this prompted a variety of programming change      Last echocardiogram demonstrated ejection fracton of 60-65% during hospitalization 3/16    She also has a history of atrial arrhythmias for which she was previously  treated with amiodarone now discontinued   She has oxygen-dependent COPD  She has been much improved with CRT and her breathing is slowly worsening, but she has had intercurrent pneumonia and also has recurrent pneumonia requiring repeated antibiotics    Date Cr K Hgb  1/19/  1.01 4.5 10.6           Past Medical History:  Diagnosis Date  . Anemia   . Anginal pain ([place])   . Arthritis    right hip  . Atrial fibrillation ([place])   . Blood transfusion   . CAD (coronary artery disease)    mild disease per cath in 2011  . CHF (congestive heart failure) ([place])   . Chronic abdominal pain   . Chronic nausea   . Chronic pain syndrome   . COPD (chronic obstructive pulmonary disease) ([facility])    continuous O2- 2l  . Diverticulitis   . Elbow fracture, left aug 2012  . Elevated liver enzymes   . Fibromyalgia    COUPLE OF TIMES A YEAR-PT HAS  EPISODES OF CONFUSION-USUALLY INVOLVES DAY/NIGHT REVERSAL AND EPISODES OF TWITCHING AND FALLS--SEES DR. Jacelyn Grip - NEUROLOGIST-LAST SEEN 07/10/11  . Gastroparesis   . GERD (gastroesophageal reflux disease)   . H/O: GI bleed    from Pradaxa  . Hepatitis    hx of in high school   . HTN (hypertension)    under control; has been on med. x "years"  . Hx MRSA infection   . Hx of gastric ulcer   . Hx of stress fx aug 2011   right hip   . LBBB (left bundle branch block)   . Oxygen dependent    2 liters via nasal cannula at all times  . Pacemaker    CRT therapy; followed by Dr. Caryl Comes  . Pleural effusion     s/p right thoracentesis 03/09  . Pneumonia - 03/09, 12/09, 02/10, 06/10   MOST RECENT FEB 2013  . Primary dilated cardiomyopathy (H[facility])    EF 45 to 50% per echo in Jan 2012  . RLS (restless legs syndrome)   . Silent aspiration   . Small bowel obstruction ([place])   . Urinary incontinence     -INTERSTIM IMPLANT NOT FUNCTIONING PER PT  . Venous embolism and thrombosis of subclavian vein ([place])    after pacemaker insertion Oct 2011  . Vitamin B12 deficiency     Past Surgical History:  Procedure Laterality Date  . APPENDECTOMY    . CARDIAC CATHETERIZATION  04/08/2006, 11/16/2009  . COLECTOMY     Intestinal resection (5times)  .  COLECTOMY  02/04/2000   ex. lap., intra-abd. subtotal colectomy with ileosigmoid colon anastomosies and lysis of adhesions  . CYSTOSCOPY  11/11/2008  . CYSTOSCOPY W/ RETROGRADES  11/11/2008   right  . CYSTOSCOPY WITH INJECTION  04/30/2006   transurethral collagen injection; incision vaginal stenosis  . ESOPHAGOGASTRODUODENOSCOPY N/A 02/02/2013   Procedure: ESOPHAGOGASTRODUODENOSCOPY (EGD);  Surgeon: Lafayette Dragon, MD;  Location: [facility];  Service: Endoscopy;  Laterality: N/A;  . EXPLORATORY LAPAROTOMY  04/27/2009   lysis of adhesions, gastrostomy tube  . GASTROCUTANEOUS FISTULA CLOSURE    . INTERSTIM IMPLANT PLACEMENT  05/28/2006 - stage I   06/05/2006 - stage II   . INTERSTIM IMPLANT PLACEMENT  02/05/2012   Procedure: [name] IMPLANT FIRST STAGE;  Surgeon: Reece Packer, MD;  Location: [facility];  Service: Urology;  Laterality: N/A;  Replacement of Interstim Lead     . INTERSTIM IMPLANT REVISION  03/06/2011   Procedure: REVISION OF [name];  Surgeon: Reece Packer, MD;  Location: [facility];  Service: Urology;  Laterality: N/A;  Replacement of Neurostimulator  . INTERSTIM IMPLANT REVISION  10/23/2007  . PACEMAKER PLACEMENT  12/21/2009  . Peg removed     with complications  . PORT-A-CATH REMOVAL  07/27/2011   Procedure: REMOVAL PORT-A-CATH;  Surgeon: Rolm Bookbinder, MD;  Location: [facility];  Service: General;  Laterality: N/A;  . PORTACATH PLACEMENT  12/16/2009  . SAVORY DILATION N/A 02/02/2013   Procedure: SAVORY DILATION;  Surgeon: Lafayette Dragon, MD;  Location: [facility];  Service: Endoscopy;  Laterality: N/A;  . SMALL INTESTINE SURGERY  05/20/2001   ex. lap., resection of small bowel stricture; gastrostomy; insertion central line  . TONSILLECTOMY  age 7  . TOTAL ABDOMINAL HYSTERECTOMY     complete  . TOTAL HIP ARTHROPLASTY  08/03/2011   Procedure: TOTAL HIP ARTHROPLASTY ANTERIOR APPROACH;  Surgeon: Mcarthur Rossetti, MD;  Location: [facility];  Service: Orthopedics;  Laterality: Right;    Current Outpatient Medications  Medication Sig Dispense Refill  . AMBULATORY NON FORMULARY MEDICATION Pt takes Oxygen, 2 units, every day    . carvedilol (COREG) 25 MG tablet TAKE 1 TABLET BY MOUTH TWICE DAILY 180 tablet 1  . furosemide (LASIX) 40 MG tablet TAKE ONE TABLET BY MOUTH IN THE MORNING 90 tablet 1  . gabapentin (NEURONTIN) 600 MG tablet TAKE 1 TABLET BY MOUTH 4 TIMES DAILY 120 tablet 5  . MYRBETRIQ 25 MG TB24 tablet Take 1 tablet by mouth daily.    . nitroGLYCERIN (NITROSTAT) 0.4 MG SL tablet Place 1 tablet (0.4 mg total) under the tongue every 5 (five) minutes as needed. CHEST PAIN 25 tablet 1  . oxyCODONE-acetaminophen  (PERCOCET) 10-325 MG tablet Take 1 tablet by mouth every 4 (four) hours as needed. 120 tablet 0  . pantoprazole (PROTONIX) 40 MG tablet TAKE 1 TABLET BY MOUTH TWICE DAILY 180 tablet 1  . promethazine (PHENERGAN) 25 MG tablet Take 1 tablet (25 mg total) by mouth every 8 (eight) hours as needed. for nausea 40 tablet 5  . Respiratory Therapy Supplies (FLUTTER) DEVI 1 application by Does not apply route 4 (four) times daily as needed. 1 each 0  . Tiotropium Bromide Monohydrate (SPIRIVA RESPIMAT) 1.25 MCG/ACT AERS Inhale 2 puffs into the lungs daily. 1 Inhaler 5  . tiZANidine (ZANAFLEX) 4 MG tablet Take 1 tablet (4 mg total) by mouth 3 (three) times daily as needed. for muscle spams 90 tablet 2  . triamcinolone cream (KENALOG) 0.1 % Apply 1 application  topically 2 (two) times daily.    [name] zolpidem (AMBIEN) 5 MG tablet TAKE 1 TABLET BY MOUTH ONCE DAILY AT BEDTIME AS NEEDED FOR  SLEEP 30 tablet 2   No current facility-administered medications for this visit.     Allergies  Allergen Reactions  . Dabigatran Etexilate Mesylate Other (See Comments)    INTERNAL BLEEDING-pradaxa  . Talwin [Pentazocine] Other (See Comments)    hallucinations  . Pentazocine Lactate Other (See Comments)    HALLUCINATIONS  . Lactate Rash  . Sulfonamide Derivatives Other (See Comments)    JITTERINESS     Review of Systems negative except from HPI and PMH  Physical Exam BP 126/80   Pulse 73   Ht 5' (1.524 m)   Wt 101 lb 6.4 oz (46 kg)   SpO2 99%   BMI 19.80 kg/m  Well developed and cachectic wearing O2  HENT normal Neck supple with JVP-flat Clear Regular rate and rhythm, no murmurs or gallops Abd-soft with active BS No Clubbing cyanosis edema Skin-warm and dry A & Oriented  Grossly normal sensory and motor function   ECG demonstrates P synchronous pacing with a negative QRS in lead V1 and a QRS in lead I QRS duration 108 ms   assessment and  Plan  Nonischemic cardiomyopathy interval  resolution  Congestive heart failure diastolic     Atrial arrhythmia  Little by device interrogation  Pacemaker-CRT-[employer] .Marland KitchenThe patient's device was interrogated.  The information was reviewed. No changes were made in the programming.   Anemia    No evidence of volume overload.  Need to have some concern about worsening of LV systolic function in the context of her dyspnea.  However, with her pneumonia and her anemia, these should probably be addressed first.  Recent data has supported the importance of maintaining ACE inhibitors and beta-blockers (TRED-trial) so we will continue her carvedilol.  I suggested she talk to her PCP regarding liquid iron  We spent more than 50% of our >25 min visit in face to face counseling regarding the above

## 2017-05-21 ENCOUNTER — Other Ambulatory Visit: Payer: Self-pay

## 2017-05-21 LAB — MYCOBACTERIA,CULT W/FLUOROCHROME SMEAR
MICRO NUMBER:: 90072454
MICRO NUMBER:: 90078505
SMEAR:: NONE SEEN
SMEAR:: NONE SEEN
SPECIMEN QUALITY:: ADEQUATE
SPECIMEN QUALITY:: ADEQUATE

## 2017-05-21 MED ORDER — PANTOPRAZOLE SODIUM 40 MG PO TBEC: 40.0000 mg | DELAYED_RELEASE_TABLET | Freq: Two times a day (BID) | ORAL | 1 refills | Status: DC

## 2017-05-21 NOTE — Telephone Encounter (Signed)
Pantoprazole 40mg  refilled as requested by patient.

## 2017-05-28 ENCOUNTER — Encounter: Payer: Self-pay | Admitting: Pulmonary Disease

## 2017-05-28 ENCOUNTER — Ambulatory Visit: Payer: PPO | Admitting: Pulmonary Disease

## 2017-05-28 VITALS — BP 110/68 | HR 70

## 2017-05-28 DIAGNOSIS — J9611 Chronic respiratory failure with hypoxia: Secondary | ICD-10-CM

## 2017-05-28 DIAGNOSIS — J479 Bronchiectasis, uncomplicated: Secondary | ICD-10-CM | POA: Diagnosis not present

## 2017-05-28 DIAGNOSIS — J432 Centrilobular emphysema: Secondary | ICD-10-CM | POA: Diagnosis not present

## 2017-05-28 NOTE — Progress Notes (Signed)
[facility]  Chief Complaint  Patient presents with  . Follow-up    Cough but little productive has a weird taste to it. Has had SOB wihile walking or talking.    Vital signs: BP 110/68 (BP Location: Right Arm, Cuff Size: Normal)   Pulse 70   SpO2 100%   History of Present Illness: [name] is a 72 y.o. female former smoker with COPD/emphysema, chronic hypoxic respiratory failure, and recurrent pneumonia with focal bronchiectasis.  She is feeling better with her breathing since being off MTX.  Still gets cough, but mostly dry.  Denies chest pain, fever, or hemoptysis.  Has some joint pain, but tolerable.  Feels like she is getting around better.  Still on 3 liters oxygen.  Wanted to know if okay for her to start going to church again.  Physical Exam:  General - pleasant, thin, wearing oxygen Eyes - pupils reactive ENT - no sinus tenderness, no oral exudate, no LAN Cardiac - regular, no murmur Chest - no wheeze, rales Abd - soft, non tender Ext - no edema Skin - no rashes Neuro - normal strength Psych - normal mood   Discussion: Respiratory status improved after stopping MTX.  Assessment/Plan:  Bronchiectasis. - prn flutter valve, and mucinex  COPD with emphysema. - continue spiriva  Chronic respiratory failure with hypoxia. - 2 to 3 liters oxygen 24/7  Plaque psoriasis. - f/u with Dr. Renda Rolls   Patient Instructions  Follow up in 6 months    Chesley Mires, MD [place] 05/28/2017, 11:26 AM Pager:  [phone]  Flow Sheet  Pulmonary tests: PFT 06/28/08 >> FEV1 1.95 (97%), FEV1% 70, TLC 3.88 (87%), DLCO 64% ONO 02/11/10 >> Test time 9 hrs 9 min, Mean SpO2 87%, low SpO2 80%, Spent 5hrs 57 min (65%) with SpO2 < 88%. PFT 10/10/11 >> FEV1 1.15 (59%), FEV1% 64, TLC 3.26 (73%), DLCO 35%, + BD CT chest 04/30/2011 >> Chronic multifocal nodular and patchy airspace densities throughout the right  lung with mild BTX CT chest 03/28/17 >> mild/mod cylindrical BTX diffuse and b/l, tree in bud, nodular infiltrates  Cardiac tests Echo 05/18/14 >> EF 60 to 44%, grade 1 diastolic dysfx, PAS 30 mmHg  Past Medical History: She  has a past medical history of Anemia, Anginal pain ([place]), Arthritis, Atrial fibrillation ([place]), Blood transfusion, CAD (coronary artery disease), CHF (congestive heart failure) ([place]), Chronic abdominal pain, Chronic nausea, Chronic pain syndrome, COPD (chronic obstructive pulmonary disease) ([place]), Diverticulitis, Elbow fracture, left (aug 2012), Elevated liver enzymes, Fibromyalgia, Gastroparesis, GERD (gastroesophageal reflux disease), H/O: GI bleed, Hepatitis, HTN (hypertension), MRSA infection, gastric ulcer, stress fx (aug 2011), LBBB (left bundle branch block), Oxygen dependent, Pacemaker, Pleural effusion, Pneumonia (- 03/09, 12/09, 02/10, 06/10), Primary dilated cardiomyopathy ([place]), RLS (restless legs syndrome), Silent aspiration, Small bowel obstruction ([place]), Urinary incontinence, Venous embolism and thrombosis of subclavian vein ([place]), and Vitamin B12 deficiency.  Past Surgical History: She  has a past surgical history that includes Appendectomy; pacemaker placement (12/21/2009); Peg removed; Tonsillectomy (age 35); Interstim implant revision (03/06/2011); Portacath placement (12/16/2009); Total abdominal hysterectomy; Interstim Implant placement (05/28/2006 - stage I); Interstim implant revision (10/23/2007); Cystoscopy with injection (04/30/2006); Small intestine surgery (05/20/2001); Cardiac catheterization (04/08/2006, 11/16/2009); Cystoscopy (11/11/2008); Cystoscopy w/ retrogrades (11/11/2008); Colectomy; Colectomy (02/04/2000); Gastrocutaneous fistula closure; Exploratory laparotomy (04/27/2009); Port-a-cath removal (07/27/2011); Total hip arthroplasty (08/03/2011); Interstim Implant placement (02/05/2012); Esophagogastroduodenoscopy (N/A, 02/02/2013); and Savory dilation (N/A,  02/02/2013).  Family History: Her family history includes Breast  cancer in her maternal aunt; Diabetes in her paternal grandfather; Heart disease in her brother, father, mother, and paternal aunt; Stroke in her mother.  Social History: She  reports that she quit smoking about 31 years ago. She has a 20.00 pack-year smoking history. she has never used smokeless tobacco. She reports that she does not drink alcohol or use drugs.  Medications: Allergies as of 05/28/2017      Reactions   Dabigatran Etexilate Mesylate Other (See Comments)   INTERNAL BLEEDING-pradaxa   Talwin [pentazocine] Other (See Comments)   hallucinations   Pentazocine Lactate Other (See Comments)   HALLUCINATIONS   Lactate Rash   Sulfonamide Derivatives Other (See Comments)   JITTERINESS      Medication List        Accurate as of 05/28/17 11:26 AM. Always use your most recent med list.          AMBULATORY NON FORMULARY MEDICATION Pt takes Oxygen, 2 units, every day   carvedilol 25 MG tablet Commonly known as:  COREG TAKE 1 TABLET BY MOUTH TWICE DAILY   FLUTTER Devi 1 application by Does not apply route 4 (four) times daily as needed.   furosemide 40 MG tablet Commonly known as:  LASIX TAKE ONE TABLET BY MOUTH IN THE MORNING   gabapentin 600 MG tablet Commonly known as:  NEURONTIN TAKE 1 TABLET BY MOUTH 4 TIMES DAILY   nitroGLYCERIN 0.4 MG SL tablet Commonly known as:  NITROSTAT Place 1 tablet (0.4 mg total) under the tongue every 5 (five) minutes as needed. CHEST PAIN   oxyCODONE-acetaminophen 10-325 MG tablet Commonly known as:  PERCOCET Take 1 tablet by mouth every 4 (four) hours as needed.   pantoprazole 40 MG tablet Commonly known as:  PROTONIX Take 1 tablet (40 mg total) by mouth 2 (two) times daily.   promethazine 25 MG tablet Commonly known as:  PHENERGAN Take 1 tablet (25 mg total) by mouth every 8 (eight) hours as needed. for nausea   Tiotropium Bromide Monohydrate 1.25 MCG/ACT  Aers Commonly known as:  SPIRIVA RESPIMAT Inhale 2 puffs into the lungs daily.   tiZANidine 4 MG tablet Commonly known as:  ZANAFLEX Take 1 tablet (4 mg total) by mouth 3 (three) times daily as needed. for muscle spams   triamcinolone cream 0.1 % Commonly known as:  KENALOG Apply 1 application topically 2 (two) times daily.   zolpidem 5 MG tablet Commonly known as:  AMBIEN TAKE 1 TABLET BY MOUTH ONCE DAILY AT BEDTIME AS NEEDED FOR  SLEEP

## 2017-05-28 NOTE — Patient Instructions (Signed)
Follow up in 6 months 

## 2017-05-30 ENCOUNTER — Encounter (HOSPITAL_BASED_OUTPATIENT_CLINIC_OR_DEPARTMENT_OTHER): Payer: Self-pay | Admitting: Emergency Medicine

## 2017-05-30 ENCOUNTER — Other Ambulatory Visit: Payer: Self-pay

## 2017-05-30 ENCOUNTER — Encounter: Payer: Self-pay | Admitting: Family Medicine

## 2017-05-30 ENCOUNTER — Emergency Department (HOSPITAL_BASED_OUTPATIENT_CLINIC_OR_DEPARTMENT_OTHER): Payer: PPO

## 2017-05-30 ENCOUNTER — Inpatient Hospital Stay (HOSPITAL_BASED_OUTPATIENT_CLINIC_OR_DEPARTMENT_OTHER)
Admission: EM | Admit: 2017-05-30 | Discharge: 2017-06-01 | DRG: 069 | Disposition: A | Discharge status: Home / Self Care | Payer: PPO | Attending: Internal Medicine | Admitting: Internal Medicine

## 2017-05-30 ENCOUNTER — Ambulatory Visit: Payer: PPO | Admitting: Family Medicine

## 2017-05-30 VITALS — BP 140/70 | HR 79 | Temp 98.1°F | Resp 18 | Wt 99.6 lb

## 2017-05-30 DIAGNOSIS — J439 Emphysema, unspecified: Secondary | ICD-10-CM | POA: Diagnosis not present

## 2017-05-30 DIAGNOSIS — R531 Weakness: Secondary | ICD-10-CM

## 2017-05-30 DIAGNOSIS — I2510 Atherosclerotic heart disease of native coronary artery without angina pectoris: Secondary | ICD-10-CM | POA: Diagnosis present

## 2017-05-30 DIAGNOSIS — I4891 Unspecified atrial fibrillation: Secondary | ICD-10-CM | POA: Diagnosis not present

## 2017-05-30 DIAGNOSIS — Z8249 Family history of ischemic heart disease and other diseases of the circulatory system: Secondary | ICD-10-CM

## 2017-05-30 DIAGNOSIS — R4781 Slurred speech: Secondary | ICD-10-CM

## 2017-05-30 DIAGNOSIS — R52 Pain, unspecified: Secondary | ICD-10-CM | POA: Diagnosis not present

## 2017-05-30 DIAGNOSIS — G459 Transient cerebral ischemic attack, unspecified: Principal | ICD-10-CM | POA: Diagnosis present

## 2017-05-30 DIAGNOSIS — Z882 Allergy status to sulfonamides status: Secondary | ICD-10-CM

## 2017-05-30 DIAGNOSIS — Z888 Allergy status to other drugs, medicaments and biological substances status: Secondary | ICD-10-CM

## 2017-05-30 DIAGNOSIS — Z86718 Personal history of other venous thrombosis and embolism: Secondary | ICD-10-CM

## 2017-05-30 DIAGNOSIS — G2581 Restless legs syndrome: Secondary | ICD-10-CM | POA: Diagnosis not present

## 2017-05-30 DIAGNOSIS — Z950 Presence of cardiac pacemaker: Secondary | ICD-10-CM

## 2017-05-30 DIAGNOSIS — D649 Anemia, unspecified: Secondary | ICD-10-CM | POA: Diagnosis present

## 2017-05-30 DIAGNOSIS — M810 Age-related osteoporosis without current pathological fracture: Secondary | ICD-10-CM | POA: Diagnosis present

## 2017-05-30 DIAGNOSIS — N179 Acute kidney failure, unspecified: Secondary | ICD-10-CM | POA: Diagnosis present

## 2017-05-30 DIAGNOSIS — I480 Paroxysmal atrial fibrillation: Secondary | ICD-10-CM | POA: Diagnosis not present

## 2017-05-30 DIAGNOSIS — N3000 Acute cystitis without hematuria: Secondary | ICD-10-CM | POA: Diagnosis not present

## 2017-05-30 DIAGNOSIS — Z803 Family history of malignant neoplasm of breast: Secondary | ICD-10-CM

## 2017-05-30 DIAGNOSIS — G894 Chronic pain syndrome: Secondary | ICD-10-CM | POA: Diagnosis present

## 2017-05-30 DIAGNOSIS — R945 Abnormal results of liver function studies: Secondary | ICD-10-CM | POA: Diagnosis not present

## 2017-05-30 DIAGNOSIS — R200 Anesthesia of skin: Secondary | ICD-10-CM | POA: Diagnosis not present

## 2017-05-30 DIAGNOSIS — Z8614 Personal history of Methicillin resistant Staphylococcus aureus infection: Secondary | ICD-10-CM

## 2017-05-30 DIAGNOSIS — I420 Dilated cardiomyopathy: Secondary | ICD-10-CM | POA: Diagnosis present

## 2017-05-30 DIAGNOSIS — G8191 Hemiplegia, unspecified affecting right dominant side: Secondary | ICD-10-CM | POA: Diagnosis not present

## 2017-05-30 DIAGNOSIS — R4701 Aphasia: Secondary | ICD-10-CM | POA: Diagnosis not present

## 2017-05-30 DIAGNOSIS — Z9981 Dependence on supplemental oxygen: Secondary | ICD-10-CM | POA: Diagnosis not present

## 2017-05-30 DIAGNOSIS — E785 Hyperlipidemia, unspecified: Secondary | ICD-10-CM | POA: Diagnosis present

## 2017-05-30 DIAGNOSIS — I4892 Unspecified atrial flutter: Secondary | ICD-10-CM | POA: Diagnosis not present

## 2017-05-30 DIAGNOSIS — I110 Hypertensive heart disease with heart failure: Secondary | ICD-10-CM | POA: Diagnosis present

## 2017-05-30 DIAGNOSIS — I447 Left bundle-branch block, unspecified: Secondary | ICD-10-CM | POA: Diagnosis present

## 2017-05-30 DIAGNOSIS — K219 Gastro-esophageal reflux disease without esophagitis: Secondary | ICD-10-CM | POA: Diagnosis present

## 2017-05-30 DIAGNOSIS — K3184 Gastroparesis: Secondary | ICD-10-CM | POA: Diagnosis present

## 2017-05-30 DIAGNOSIS — R29700 NIHSS score 0: Secondary | ICD-10-CM | POA: Diagnosis present

## 2017-05-30 DIAGNOSIS — R29818 Other symptoms and signs involving the nervous system: Secondary | ICD-10-CM | POA: Diagnosis present

## 2017-05-30 DIAGNOSIS — N390 Urinary tract infection, site not specified: Secondary | ICD-10-CM | POA: Diagnosis present

## 2017-05-30 DIAGNOSIS — I10 Essential (primary) hypertension: Secondary | ICD-10-CM | POA: Diagnosis not present

## 2017-05-30 DIAGNOSIS — Z833 Family history of diabetes mellitus: Secondary | ICD-10-CM

## 2017-05-30 DIAGNOSIS — E860 Dehydration: Secondary | ICD-10-CM | POA: Diagnosis present

## 2017-05-30 DIAGNOSIS — Z9049 Acquired absence of other specified parts of digestive tract: Secondary | ICD-10-CM

## 2017-05-30 DIAGNOSIS — Z79899 Other long term (current) drug therapy: Secondary | ICD-10-CM

## 2017-05-30 DIAGNOSIS — M797 Fibromyalgia: Secondary | ICD-10-CM | POA: Diagnosis present

## 2017-05-30 DIAGNOSIS — D638 Anemia in other chronic diseases classified elsewhere: Secondary | ICD-10-CM | POA: Diagnosis present

## 2017-05-30 DIAGNOSIS — Z885 Allergy status to narcotic agent status: Secondary | ICD-10-CM

## 2017-05-30 DIAGNOSIS — R7989 Other specified abnormal findings of blood chemistry: Secondary | ICD-10-CM | POA: Diagnosis present

## 2017-05-30 DIAGNOSIS — Z96641 Presence of right artificial hip joint: Secondary | ICD-10-CM | POA: Diagnosis present

## 2017-05-30 DIAGNOSIS — Z87891 Personal history of nicotine dependence: Secondary | ICD-10-CM

## 2017-05-30 DIAGNOSIS — G458 Other transient cerebral ischemic attacks and related syndromes: Secondary | ICD-10-CM | POA: Diagnosis not present

## 2017-05-30 DIAGNOSIS — I5032 Chronic diastolic (congestive) heart failure: Secondary | ICD-10-CM | POA: Diagnosis not present

## 2017-05-30 DIAGNOSIS — R410 Disorientation, unspecified: Secondary | ICD-10-CM | POA: Diagnosis not present

## 2017-05-30 DIAGNOSIS — Z8711 Personal history of peptic ulcer disease: Secondary | ICD-10-CM | POA: Diagnosis not present

## 2017-05-30 DIAGNOSIS — R5382 Chronic fatigue, unspecified: Secondary | ICD-10-CM | POA: Diagnosis present

## 2017-05-30 DIAGNOSIS — Z823 Family history of stroke: Secondary | ICD-10-CM

## 2017-05-30 LAB — COMPREHENSIVE METABOLIC PANEL
ALT: 64 U/L — ABNORMAL HIGH (ref 14–54)
AST: 59 U/L — ABNORMAL HIGH (ref 15–41)
Albumin: 3.7 g/dL (ref 3.5–5.0)
Alkaline Phosphatase: 204 U/L — ABNORMAL HIGH (ref 38–126)
Anion gap: 9 (ref 5–15)
BUN: 28 mg/dL — ABNORMAL HIGH (ref 6–20)
CO2: 30 mmol/L (ref 22–32)
Calcium: 8.5 mg/dL — ABNORMAL LOW (ref 8.9–10.3)
Chloride: 96 mmol/L — ABNORMAL LOW (ref 101–111)
Creatinine, Ser: 1.38 mg/dL — ABNORMAL HIGH (ref 0.44–1.00)
GFR calc Af Amer: 43 mL/min — ABNORMAL LOW (ref >60)
GFR calc non Af Amer: 37 mL/min — ABNORMAL LOW (ref >60)
Glucose, Bld: 83 mg/dL (ref 65–99)
Potassium: 4 mmol/L (ref 3.5–5.1)
Sodium: 135 mmol/L (ref 135–145)
Total Bilirubin: 0.6 mg/dL (ref 0.3–1.2)
Total Protein: 7.3 g/dL (ref 6.5–8.1)

## 2017-05-30 LAB — URINALYSIS, MICROSCOPIC (REFLEX)

## 2017-05-30 LAB — URINALYSIS, ROUTINE W REFLEX MICROSCOPIC
Bilirubin Urine: NEGATIVE
Glucose, UA: NEGATIVE mg/dL
Ketones, ur: NEGATIVE mg/dL
Nitrite: POSITIVE — AB
Protein, ur: NEGATIVE mg/dL
Specific Gravity, Urine: <1.005 — ABNORMAL LOW (ref 1.005–1.030)
pH: 6 (ref 5.0–8.0)

## 2017-05-30 LAB — RAPID URINE DRUG SCREEN, HOSP PERFORMED
Amphetamines: NOT DETECTED
Barbiturates: NOT DETECTED
Benzodiazepines: NOT DETECTED
Cocaine: NOT DETECTED
Opiates: POSITIVE — AB
Tetrahydrocannabinol: NOT DETECTED

## 2017-05-30 LAB — DIFFERENTIAL
Basophils Absolute: 0 10*3/uL (ref 0.0–0.1)
Basophils Relative: 0 %
Eosinophils Absolute: 0.3 10*3/uL (ref 0.0–0.7)
Eosinophils Relative: 3 %
Lymphocytes Relative: 7 %
Lymphs Abs: 0.6 10*3/uL — ABNORMAL LOW (ref 0.7–4.0)
Monocytes Absolute: 0.9 10*3/uL (ref 0.1–1.0)
Monocytes Relative: 10 %
Neutro Abs: 7.7 10*3/uL (ref 1.7–7.7)
Neutrophils Relative %: 80 %

## 2017-05-30 LAB — CBC
HCT: 32.5 % — ABNORMAL LOW (ref 36.0–46.0)
Hemoglobin: 10.5 g/dL — ABNORMAL LOW (ref 12.0–15.0)
MCH: 29.6 pg (ref 26.0–34.0)
MCHC: 32.3 g/dL (ref 30.0–36.0)
MCV: 91.5 fL (ref 78.0–100.0)
Platelets: 198 10*3/uL (ref 150–400)
RBC: 3.55 MIL/uL — ABNORMAL LOW (ref 3.87–5.11)
RDW: 14.2 % (ref 11.5–15.5)
WBC: 9.5 10*3/uL (ref 4.0–10.5)

## 2017-05-30 LAB — PROTIME-INR
INR: 1.13
Prothrombin Time: 14.4 seconds (ref 11.4–15.2)

## 2017-05-30 LAB — ETHANOL: Alcohol, Ethyl (B): <10 mg/dL (ref <10)

## 2017-05-30 LAB — CBG MONITORING, ED: Glucose-Capillary: 71 mg/dL (ref 65–99)

## 2017-05-30 LAB — APTT: aPTT: 43 seconds — ABNORMAL HIGH (ref 24–36)

## 2017-05-30 LAB — TROPONIN I: Troponin I: <0.03 ng/mL (ref <0.03)

## 2017-05-30 MED ORDER — OXYCODONE HCL 5 MG PO TABS
10.0000 mg | ORAL_TABLET | Freq: Four times a day (QID) | ORAL | Status: DC | PRN
  Administered 2017-05-31 – 2017-06-01 (×5): 10 mg via ORAL
  Filled 2017-05-30 (×5): qty 2

## 2017-05-30 MED ORDER — ZOLPIDEM TARTRATE 5 MG PO TABS
5.0000 mg | ORAL_TABLET | Freq: Every evening | ORAL | Status: DC | PRN
  Administered 2017-05-31 (×2): 5 mg via ORAL
  Filled 2017-05-30 (×2): qty 1

## 2017-05-30 MED ORDER — PANTOPRAZOLE SODIUM 40 MG PO TBEC
40.0000 mg | DELAYED_RELEASE_TABLET | Freq: Two times a day (BID) | ORAL | Status: DC
  Administered 2017-05-31 – 2017-06-01 (×4): 40 mg via ORAL
  Filled 2017-05-30 (×4): qty 1

## 2017-05-30 MED ORDER — SODIUM CHLORIDE 0.9 % IV SOLN
INTRAVENOUS | Status: DC
Start: 2017-05-31 — End: 2017-06-01
  Administered 2017-05-31: via INTRAVENOUS
  Administered 2017-05-31: 75 mL via INTRAVENOUS
  Administered 2017-06-01: 09:00:00 via INTRAVENOUS

## 2017-05-30 MED ORDER — ACETAMINOPHEN 325 MG PO TABS: 650.0000 mg | ORAL_TABLET | Freq: Four times a day (QID) | ORAL | Status: DC | PRN

## 2017-05-30 MED ORDER — OXYCODONE-ACETAMINOPHEN 10-325 MG PO TABS: 1.0000 | ORAL_TABLET | ORAL | 0 refills | Status: DC | PRN

## 2017-05-30 MED ORDER — GABAPENTIN 600 MG PO TABS
600.0000 mg | ORAL_TABLET | Freq: Four times a day (QID) | ORAL | Status: DC
  Administered 2017-05-31 – 2017-06-01 (×5): 600 mg via ORAL
  Filled 2017-05-30 (×5): qty 1

## 2017-05-30 MED ORDER — TIZANIDINE HCL 4 MG PO TABS: 4.0000 mg | ORAL_TABLET | Freq: Three times a day (TID) | ORAL | Status: DC | PRN

## 2017-05-30 MED ORDER — TIOTROPIUM BROMIDE MONOHYDRATE 18 MCG IN CAPS
18.0000 ug | ORAL_CAPSULE | Freq: Every day | RESPIRATORY_TRACT | Status: DC
  Filled 2017-05-30: qty 5

## 2017-05-30 MED ORDER — SODIUM CHLORIDE 0.9 % IV SOLN
1.0000 g | INTRAVENOUS | Status: DC
  Administered 2017-05-31: 1 g via INTRAVENOUS
  Filled 2017-05-30 (×2): qty 10

## 2017-05-30 MED ORDER — HYDRALAZINE HCL 20 MG/ML IJ SOLN: 5.0000 mg | INTRAMUSCULAR | Status: DC | PRN

## 2017-05-30 MED ORDER — TIZANIDINE HCL 4 MG PO TABS: 4.0000 mg | ORAL_TABLET | Freq: Three times a day (TID) | ORAL | 2 refills | Status: DC | PRN

## 2017-05-30 MED ORDER — FOLIC ACID 1 MG PO TABS
1.0000 mg | ORAL_TABLET | Freq: Every day | ORAL | Status: DC
  Administered 2017-05-31 – 2017-06-01 (×2): 1 mg via ORAL
  Filled 2017-05-30 (×2): qty 1

## 2017-05-30 MED ORDER — SODIUM CHLORIDE 0.9 % IV SOLN
1.0000 g | Freq: Once | INTRAVENOUS | Status: AC
  Administered 2017-05-30: 1 g via INTRAVENOUS
  Filled 2017-05-30: qty 10

## 2017-05-30 MED ORDER — NITROGLYCERIN 0.4 MG SL SUBL: 0.4000 mg | SUBLINGUAL_TABLET | SUBLINGUAL | Status: DC | PRN

## 2017-05-30 MED ORDER — OXYCODONE-ACETAMINOPHEN 10-325 MG PO TABS: 1.0000 | ORAL_TABLET | ORAL | Status: DC | PRN

## 2017-05-30 MED ORDER — FLUTTER DEVI: 1.0000 "application " | Freq: Four times a day (QID) | Status: DC | PRN

## 2017-05-30 MED ORDER — GABAPENTIN 600 MG PO TABS: ORAL_TABLET | ORAL | 5 refills | Status: DC

## 2017-05-30 MED ORDER — PROMETHAZINE HCL 25 MG PO TABS: 25.0000 mg | ORAL_TABLET | Freq: Three times a day (TID) | ORAL | Status: DC | PRN

## 2017-05-30 MED ORDER — ASPIRIN EC 81 MG PO TBEC
81.0000 mg | DELAYED_RELEASE_TABLET | Freq: Every day | ORAL | Status: DC
  Administered 2017-05-31 (×2): 81 mg via ORAL
  Filled 2017-05-30 (×2): qty 1

## 2017-05-30 NOTE — ED Notes (Signed)
[facility] arrived to transport pt at this time.

## 2017-05-30 NOTE — Progress Notes (Signed)
Patient ID: [name], female    DOB: May 18, 1945  Age: 72 y.o. MRN: [id number]    Subjective:  Subjective  HPI [name] presents with her husband c/o slurred speech and upper and low ext weakness mostly on R side per husband.   Symptoms started yesterday afternoon.  She still had slurred speech this am but symptoms have mostly subsided since then  Review of Systems  Constitutional: Negative for activity change, appetite change, fatigue and unexpected weight change.  Respiratory: Negative for cough and shortness of breath.   Cardiovascular: Negative for chest pain and palpitations.  Neurological: Positive for speech difficulty, weakness and headaches.  Psychiatric/Behavioral: Negative for behavioral problems and dysphoric mood. The patient is not nervous/anxious.     History Past Medical History:  Diagnosis Date  . Anemia   . Anginal pain ([place])   . Arthritis    right hip  . Atrial fibrillation ([place])   . Blood transfusion   . CAD (coronary artery disease)    mild disease per cath in 2011  . CHF (congestive heart failure) ([place])   . Chronic abdominal pain   . Chronic nausea   . Chronic pain syndrome   . COPD (chronic obstructive pulmonary disease) ([facility])    continuous O2- 2l  . Diverticulitis   . Elbow fracture, left aug 2012  . Elevated liver enzymes   . Fibromyalgia    COUPLE OF TIMES A YEAR-PT HAS EPISODES OF CONFUSION-USUALLY INVOLVES DAY/NIGHT REVERSAL AND EPISODES OF TWITCHING AND FALLS--SEES DR. Jacelyn Grip - NEUROLOGIST-LAST SEEN 07/10/11  . Gastroparesis   . GERD (gastroesophageal reflux disease)   . H/O: GI bleed    from Pradaxa  . Hepatitis    hx of in high school   . HTN (hypertension)    under control; has been on med. x "years"  . Hx MRSA infection   . Hx of gastric ulcer   . Hx of stress fx aug 2011   right hip   . LBBB (left bundle branch block)   . Oxygen dependent    2 liters via nasal cannula at all times  . Pacemaker    CRT therapy; followed by  Dr. Caryl Comes  . Pleural effusion     s/p right thoracentesis 03/09  . Pneumonia - 03/09, 12/09, 02/10, 06/10   MOST RECENT FEB 2013  . Primary dilated cardiomyopathy ([facility])    EF 45 to 50% per echo in Jan 2012  . RLS (restless legs syndrome)   . Silent aspiration   . Small bowel obstruction ([place])   . Urinary incontinence     -INTERSTIM IMPLANT NOT FUNCTIONING PER PT  . Venous embolism and thrombosis of subclavian vein ([place])    after pacemaker insertion Oct 2011  . Vitamin B12 deficiency     She has a past surgical history that includes Appendectomy; pacemaker placement (12/21/2009); Peg removed; Tonsillectomy (age 31); Interstim implant revision (03/06/2011); Portacath placement (12/16/2009); Total abdominal hysterectomy; Interstim Implant placement (05/28/2006 - stage I); Interstim implant revision (10/23/2007); Cystoscopy with injection (04/30/2006); Small intestine surgery (05/20/2001); Cardiac catheterization (04/08/2006, 11/16/2009); Cystoscopy (11/11/2008); Cystoscopy w/ retrogrades (11/11/2008); Colectomy; Colectomy (02/04/2000); Gastrocutaneous fistula closure; Exploratory laparotomy (04/27/2009); Port-a-cath removal (07/27/2011); Total hip arthroplasty (08/03/2011); Interstim Implant placement (02/05/2012); Esophagogastroduodenoscopy (N/A, 02/02/2013); and Savory dilation (N/A, 02/02/2013).   Her family history includes Breast cancer in her maternal aunt; Diabetes in her paternal grandfather; Heart disease in her brother, father, mother, and paternal aunt; Stroke in her mother.She reports that she quit  smoking about 31 years ago. She has a 20.00 pack-year smoking history. she has never used smokeless tobacco. She reports that she does not drink alcohol or use drugs.  Current Outpatient Medications on File Prior to Visit  Medication Sig Dispense Refill  . AMBULATORY NON FORMULARY MEDICATION Pt takes Oxygen, 3 units, every day    . carvedilol (COREG) 25 MG tablet TAKE 1 TABLET BY MOUTH TWICE DAILY 751  tablet 1  . folic acid (FOLVITE) 1 MG tablet Take 1 mg by mouth daily.    . furosemide (LASIX) 40 MG tablet TAKE ONE TABLET BY MOUTH IN THE MORNING 90 tablet 1  . nitroGLYCERIN (NITROSTAT) 0.4 MG SL tablet Place 1 tablet (0.4 mg total) under the tongue every 5 (five) minutes as needed. CHEST PAIN 25 tablet 5  . promethazine (PHENERGAN) 25 MG tablet Take 1 tablet (25 mg total) by mouth every 8 (eight) hours as needed. for nausea 40 tablet 5  . Respiratory Therapy Supplies (FLUTTER) DEVI 1 application by Does not apply route 4 (four) times daily as needed. 1 each 0  . Tiotropium Bromide Monohydrate (SPIRIVA RESPIMAT) 1.25 MCG/ACT AERS Inhale 2 puffs into the lungs daily.    [name] zolpidem (AMBIEN) 5 MG tablet TAKE 1 TABLET BY MOUTH ONCE DAILY AT BEDTIME AS NEEDED FOR  SLEEP 30 tablet 2  . [DISCONTINUED] iron dextran complex (INFED) 50 MG/ML injection Please give infed infusion (no test dose needed) over 4 hours per pharmacy calculated dose. Ht: 5'2 Wt: 99 pounds Hgb: 12 1 mL 0   No current facility-administered medications on file prior to visit.      Objective:  Objective  Physical Exam  Constitutional: She is oriented to person, place, and time. She appears well-developed and well-nourished.  HENT:  Head: Normocephalic and atraumatic.  Eyes: Conjunctivae and EOM are normal.  Neck: Normal range of motion. Neck supple. No JVD present. Carotid bruit is not present. No thyromegaly present.  Cardiovascular: Normal rate, regular rhythm and normal heart sounds.  No murmur heard. Pulmonary/Chest: Effort normal and breath sounds normal. No respiratory distress. She has no wheezes. She has no rales. She exhibits no tenderness.  Musculoskeletal: She exhibits no edema.  Neurological: She is alert and oriented to person, place, and time. No cranial nerve deficit.  3/5 weakness R hip flexor and leg extension 5/5 in L leg and normal strength In upper ext  Normal speech    Psychiatric: She has a normal  mood and affect. Her behavior is normal. Judgment and thought content normal.  Nursing note and vitals reviewed.  BP 140/70 (BP Location: Left Arm, Patient Position: Sitting, Cuff Size: Normal)   Pulse 79   Temp 98.1 F (36.7 C) (Oral)   Resp 18   Wt 99 lb 9.6 oz (45.2 kg)   SpO2 98%   BMI 19.45 kg/m  Wt Readings from Last 3 Encounters:  05/30/17 99 lb 9.6 oz (45.2 kg)  05/15/17 101 lb 6.4 oz (46 kg)  04/22/17 100 lb 6.4 oz (45.5 kg)     Lab Results  Component Value Date   WBC 11.6 (H) 04/02/2017   HGB 10.7 (L) 04/02/2017   HCT 33.2 (L) 04/02/2017   PLT 367.0 04/02/2017   GLUCOSE 95 03/21/2017   CHOL 151 03/07/2017   TRIG 100.0 03/07/2017   HDL 36.90 (L) 03/07/2017   LDLCALC 94 03/07/2017   ALT 54 (H) 03/21/2017   AST 34 03/21/2017   NA 139 03/21/2017   K 4.5 03/21/2017  CL 97 03/21/2017   CREATININE 1.01 03/21/2017   BUN 17 03/21/2017   CO2 37 (H) 03/21/2017   TSH 4.43 06/26/2010   INR 1.12 05/20/2014   HGBA1C  08/17/2007    5.6 (NOTE)   The ADA recommends the following therapeutic goals for glycemic   control related to Hgb A1C measurement:   Goal of Therapy:   < 7.0% Hgb A1C   Action Suggested:  > 8.0% Hgb A1C   Ref:  Diabetes Care, 22, Suppl. 1, 1999    Ct Chest W Contrast  Result Date: 03/28/2017 CLINICAL DATA:  Possible new right lower lung pulmonary nodule on recent chest radiograph. EXAM: CT CHEST WITH CONTRAST TECHNIQUE: Multidetector CT imaging of the chest was performed during intravenous contrast administration. CONTRAST:  54mL ISOVUE-300 IOPAMIDOL (ISOVUE-300) INJECTION 61% COMPARISON:  03/21/2017 chest radiograph.  05/04/2011 chest CT. FINDINGS: Cardiovascular: Normal heart size. No significant pericardial fluid/thickening. Three lead left subclavian pacemaker is noted with lead tips in right atrium, right ventricle and coronary sinus. Left anterior descending and left circumflex coronary atherosclerosis. Atherosclerotic nonaneurysmal thoracic aorta.  Normal caliber pulmonary arteries. No central pulmonary emboli. Mediastinum/Nodes: No discrete thyroid nodules. Unremarkable esophagus. No axillary adenopathy. Mild right paratracheal adenopathy up to the 1.0 cm (series 2/image 25) is stable since 05/04/2011. Mildly enlarged 1.1 cm subcarinal node (series 2/image 57) is stable. Mild right hilar adenopathy up to 1.0 cm (series 2/image 54) appears stable compared to the noncontrast 05/04/2011 chest CT. No pathologically enlarged left hilar nodes. Lungs/Pleura: No pneumothorax. No pleural effusion. There is mild-to-moderate cylindrical bronchiectasis extensively distributed throughout both lungs involving all lung lobes, asymmetrically prominent in the right lung, with associated diffuse bronchial wall thickening. There is associated prominent patchy tree-in-bud opacity throughout both lungs at the areas of bronchiectasis, involving the right greater than left lungs. There are patchy nodular confluent foci of consolidation throughout the peribronchovascular and peripheral lungs bilaterally, asymmetrically prominent in the right lung. These findings are chronic and have progressed significantly since 05/04/2011 chest CT. Several of nodular foci of consolidation are new/increased, particularly in basilar lower lobes, right greater than left. Representative new 1.0 cm peribronchovascular right middle lobe nodular opacity (series 3/image 59). Upper abdomen: Simple 1.0 cm cystic central splenic lesion, compatible with a benign splenic lymphangioma. Mild diffuse intrahepatic biliary ductal dilatation with dilated visualized proximal common bile duct (8 mm diameter), not definitely changed since 07/29/2014 noncontrast CT abdomen study. Musculoskeletal: No aggressive appearing focal osseous lesions. Mild-to-moderate thoracic spondylosis. IMPRESSION: 1. Significant interval progression of spectrum of findings suggestive of chronic infectious bronchiolitis due to atypical  mycobacterial infection (MAI), asymmetrically involving the right lung. Diffuse cylindrical bronchiectasis, patchy tree-in-bud opacities and patchy nodular peribronchovascular and peripheral foci of consolidation. Follow-up high-resolution post treatment chest CT may be considered in 6-12 months. 2. Stable chronic mediastinal and right hilar adenopathy, most compatible with reactive adenopathy. 3. Two-vessel coronary atherosclerosis. 4. Nonspecific chronic intrahepatic and extrahepatic biliary ductal dilatation, not appreciably changed since 2,016 CT. Recommend correlation with serum bilirubin levels. Aortic Atherosclerosis (ICD10-I70.0). Electronically Signed   By: Ilona Sorrel M.D.   On: 03/28/2017 13:09     Assessment & Plan:  Plan  I have discontinued Cara H. Winfield's triamcinolone cream and pantoprazole. I am also having her maintain her AMBULATORY NON FORMULARY MEDICATION, promethazine, furosemide, carvedilol, zolpidem, FLUTTER, nitroGLYCERIN, oxyCODONE-acetaminophen, gabapentin, tiZANidine, folic acid, and Tiotropium Bromide Monohydrate.  Meds ordered this encounter  Medications  . oxyCODONE-acetaminophen (PERCOCET) 10-325 MG tablet    Sig: Take 1  tablet by mouth every 4 (four) hours as needed.    Dispense:  120 tablet    Refill:  0  . gabapentin (NEURONTIN) 600 MG tablet    Sig: TAKE 1 TABLET BY MOUTH 4 TIMES DAILY    Dispense:  120 tablet    Refill:  5    Please consider 90 day supplies to promote better adherence  . tiZANidine (ZANAFLEX) 4 MG tablet    Sig: Take 1 tablet (4 mg total) by mouth 3 (three) times daily as needed. for muscle spams    Dispense:  90 tablet    Refill:  2    Please consider 90 day supplies to promote better adherence    Problem List Items Addressed This Visit    None    Visit Diagnoses    High risk medication use    -  Primary   Relevant Orders   Pain Mgmt, Profile 8 w/Conf, U   Pain       Relevant Medications   oxyCODONE-acetaminophen (PERCOCET)  10-325 MG tablet   gabapentin (NEURONTIN) 600 MG tablet   tiZANidine (ZANAFLEX) 4 MG tablet   Weakness       Relevant Orders   CT Head Wo Contrast   Ambulatory referral to Neurology   Slurred speech       Relevant Orders   CT Head Wo Contrast   Ambulatory referral to Neurology      Follow-up: No Follow-up on file.  Ann Held, DO

## 2017-05-30 NOTE — Progress Notes (Signed)
Pt admitted from [facility] with the diagnosis of Stroke, pt alert, c/o of headache with a rating of 7, settled in bed with call light at bedside, tele monitor put and verified on pt, admit doc paged for orders, pt reassured, will however continue to monitor, v/s stable. Obasogie-Asidi, [name]

## 2017-05-30 NOTE — ED Provider Notes (Addendum)
[facility]ERGENCY DEPARTMENT Provider Note   CSN: [id number] Arrival date & time: 05/30/17  1646     History   Chief Complaint Chief Complaint  Patient presents with  . Weakness    HPI [name] is a 72 y.o. female.  The history is provided by the patient and medical records. No language interpreter was used.  Neurologic Problem  This is a new problem. The current episode started 2 days ago. The problem occurs constantly. Progression since onset: waxing and waning. Pertinent negatives include no chest pain, no abdominal pain, no headaches and no shortness of breath. Nothing aggravates the symptoms. Nothing relieves the symptoms. She has tried nothing for the symptoms. The treatment provided no relief.    Past Medical History:  Diagnosis Date  . Anemia   . Anginal pain ([place])   . Arthritis    right hip  . Atrial fibrillation ([place])   . Blood transfusion   . CAD (coronary artery disease)    mild disease per cath in 2011  . CHF (congestive heart failure) ([place])   . Chronic abdominal pain   . Chronic nausea   . Chronic pain syndrome   . COPD (chronic obstructive pulmonary disease) (HCC)    continuous O2- 2l  . Diverticulitis   . Elbow fracture, left aug 2012  . Elevated liver enzymes   . Fibromyalgia    COUPLE OF TIMES A YEAR-PT HAS EPISODES OF CONFUSION-USUALLY INVOLVES DAY/NIGHT REVERSAL AND EPISODES OF TWITCHING AND FALLS--SEES DR. Jacelyn Grip - NEUROLOGIST-LAST SEEN 07/10/11  . Gastroparesis   . GERD (gastroesophageal reflux disease)   . H/O: GI bleed    from Pradaxa  . Hepatitis    hx of in high school   . HTN (hypertension)    under control; has been on med. x "years"  . Hx MRSA infection   . Hx of gastric ulcer   . Hx of stress fx aug 2011   right hip   . LBBB (left bundle branch block)   . Oxygen dependent    2 liters via nasal cannula at all times  . Pacemaker    CRT therapy; followed by Dr. Caryl Comes  . Pleural effusion     s/p right  thoracentesis 03/09  . Pneumonia - 03/09, 12/09, 02/10, 06/10   MOST RECENT FEB 2013  . Primary dilated cardiomyopathy (HCC)    EF 45 to 50% per echo in Jan 2012  . RLS (restless legs syndrome)   . Silent aspiration   . Small bowel obstruction ([place])   . Urinary incontinence     -INTERSTIM IMPLANT NOT FUNCTIONING PER PT  . Venous embolism and thrombosis of subclavian vein ([place])    after pacemaker insertion Oct 2011  . Vitamin B12 deficiency     Patient Active Problem List   Diagnosis Date Noted  . Bronchiectasis ([place]) 04/22/2017  . Right otitis media 07/17/2016  . Cerumen impaction 07/17/2016  . Osteoporosis 03/16/2016  . Headache 11/20/2014  . Skin infection 05/27/2014  . Insomnia 05/27/2014  . Abdominal wall pain 07/09/2013  . Other dysphagia 02/02/2013  . Platelets decreased ([place]) 01/29/2012  . Vitamin D deficiency 12/13/2011  . Hypokalemia 12/13/2011  . Hypocalcemia 12/12/2011  . Adhesions of intestine, very dense, with frozen abdomen 12/11/2011  . SBO (small bowel obstruction), recurrent 12/11/2011  . Protein-calorie malnutrition, moderate ([place]) 12/11/2011  . Oxygen dependent   . Hx of gastric ulcer   . UTI (urinary tract infection) 10/15/2011  . Allergic  conjunctivitis 10/15/2011  . Degenerative arthritis of hip 08/03/2011  . B12 deficiency 07/02/2011  . Bilateral leg weakness 01/16/2011  . Anemia 09/14/2010  . Recurrent pneumonia 08/11/2010  . CRT- [employer] 06/26/2010  . DYSURIA 05/19/2010  . Chronic hypoxemic respiratory failure ([place]) 02/21/2010  . DEHYDRATION 01/06/2010  . GASTROENTERITIS 01/06/2010  . Congestive dilated cardiomyopathy ([place]) 11/17/2009  . LBBB 11/17/2009  . SYSTOLIC HEART FAILURE, CHRONIC 11/17/2009  . GERD 02/17/2009  . NONSPECIFIC ABN FINDNG RAD&OTH EXAM BILARY TRCT 09/03/2008  . COPD with emphysema ([place]) 06/28/2008  . DIVERTICULOSIS, COLON 06/03/2008  . GASTRITIS 06/02/2008  . Atrial fibrillation/flutter 12/08/2007  . GASTROPARESIS  08/07/2007  . NAUSEA, CHRONIC 08/07/2007  . Abdominal pain 08/07/2007  . SYNCOPE 02/17/2007  . RESTLESS LEG SYNDROME 08/01/2006  . Chronic pain syndrome 08/01/2006  . OSTEOARTHRITIS 07/31/2006  . [place] DISEASE 07/31/2006  . FIBROMYALGIA 07/31/2006  . CHRONIC FATIGUE SYNDROME 07/31/2006  . URINARY INCONTINENCE 07/31/2006  . ANEMIA-IRON DEFICIENCY 05/01/2006  . Essential hypertension 05/01/2006    Past Surgical History:  Procedure Laterality Date  . APPENDECTOMY    . CARDIAC CATHETERIZATION  04/08/2006, 11/16/2009  . COLECTOMY     Intestinal resection (5times)  . COLECTOMY  02/04/2000   ex. lap., intra-abd. subtotal colectomy with ileosigmoid colon anastomosies and lysis of adhesions  . CYSTOSCOPY  11/11/2008  . CYSTOSCOPY W/ RETROGRADES  11/11/2008   right  . CYSTOSCOPY WITH INJECTION  04/30/2006   transurethral collagen injection; incision vaginal stenosis  . ESOPHAGOGASTRODUODENOSCOPY N/A 02/02/2013   Procedure: ESOPHAGOGASTRODUODENOSCOPY (EGD);  Surgeon: Lafayette Dragon, MD;  Location: [facility];  Service: Endoscopy;  Laterality: N/A;  . EXPLORATORY LAPAROTOMY  04/27/2009   lysis of adhesions, gastrostomy tube  . GASTROCUTANEOUS FISTULA CLOSURE    . INTERSTIM IMPLANT PLACEMENT  05/28/2006 - stage I   06/05/2006 - stage II  . INTERSTIM IMPLANT PLACEMENT  02/05/2012   Procedure: [name] IMPLANT FIRST STAGE;  Surgeon: Reece Packer, MD;  Location: [facility];  Service: Urology;  Laterality: N/A;  Replacement of Interstim Lead     . INTERSTIM IMPLANT REVISION  03/06/2011   Procedure: REVISION OF [name];  Surgeon: Reece Packer, MD;  Location: [facility];  Service: Urology;  Laterality: N/A;  Replacement of Neurostimulator  . INTERSTIM IMPLANT REVISION  10/23/2007  . PACEMAKER PLACEMENT  12/21/2009  . Peg removed     with complications  . PORT-A-CATH REMOVAL  07/27/2011   Procedure: REMOVAL PORT-A-CATH;  Surgeon: Rolm Bookbinder, MD;  Location: [facility];  Service: General;  Laterality: N/A;  . PORTACATH PLACEMENT  12/16/2009  . SAVORY DILATION N/A 02/02/2013   Procedure: SAVORY DILATION;  Surgeon: Lafayette Dragon, MD;  Location: [facility];  Service: Endoscopy;  Laterality: N/A;  . SMALL INTESTINE SURGERY  05/20/2001   ex. lap., resection of small bowel stricture; gastrostomy; insertion central line  . TONSILLECTOMY  age 1  . TOTAL ABDOMINAL HYSTERECTOMY     complete  . TOTAL HIP ARTHROPLASTY  08/03/2011   Procedure: TOTAL HIP ARTHROPLASTY ANTERIOR APPROACH;  Surgeon: Mcarthur Rossetti, MD;  Location: [facility];  Service: Orthopedics;  Laterality: Right;    OB History    No data available       Home Medications    Prior to Admission medications   Medication Sig Start Date End Date Taking? Authorizing Provider  AMBULATORY NON FORMULARY MEDICATION Pt takes Oxygen, 3 units, every day    [provider]  carvedilol (COREG) 25 MG  tablet TAKE 1 TABLET BY MOUTH TWICE DAILY 01/31/17   Minus Breeding, MD  folic acid (FOLVITE) 1 MG tablet Take 1 mg by mouth daily.    [provider]  furosemide (LASIX) 40 MG tablet TAKE ONE TABLET BY MOUTH IN THE MORNING 11/27/16   Minus Breeding, MD  gabapentin (NEURONTIN) 600 MG tablet TAKE 1 TABLET BY MOUTH 4 TIMES DAILY 05/30/17   Carollee Herter, Alferd Apa, DO  nitroGLYCERIN (NITROSTAT) 0.4 MG SL tablet Place 1 tablet (0.4 mg total) under the tongue every 5 (five) minutes as needed. CHEST PAIN 05/15/17   Deboraha Sprang, MD  oxyCODONE-acetaminophen (PERCOCET) 10-325 MG tablet Take 1 tablet by mouth every 4 (four) hours as needed. 05/30/17   Ann Held, DO  promethazine (PHENERGAN) 25 MG tablet Take 1 tablet (25 mg total) by mouth every 8 (eight) hours as needed. for nausea 02/21/16   Gatha Mayer, MD  Respiratory Therapy Supplies (FLUTTER) DEVI 1 application by Does not apply route 4 (four) times daily as needed. 04/03/17   Chesley Mires, MD  Tiotropium Bromide Monohydrate  (SPIRIVA RESPIMAT) 1.25 MCG/ACT AERS Inhale 2 puffs into the lungs daily.    [provider]  tiZANidine (ZANAFLEX) 4 MG tablet Take 1 tablet (4 mg total) by mouth 3 (three) times daily as needed. for muscle spams 05/30/17   Carollee Herter, Alferd Apa, DO  zolpidem (AMBIEN) 5 MG tablet TAKE 1 TABLET BY MOUTH ONCE DAILY AT BEDTIME AS NEEDED FOR  SLEEP 03/07/17   Roma Schanz R, DO  iron dextran complex (INFED) 50 MG/ML injection Please give infed infusion (no test dose needed) over 4 hours per pharmacy calculated dose. Ht: 5'2 Wt: 99 pounds Hgb: 12 12/20/10 06/05/11  Lafayette Dragon, MD    Family History Family History  Problem Relation Age of Onset  . Heart disease Father   . Stroke Mother   . Heart disease Mother   . Heart disease Brother   . Breast cancer Maternal Aunt   . Heart disease Paternal Aunt   . Diabetes Paternal Grandfather   . Colon cancer Neg Hx     Social History Social History   Tobacco Use  . Smoking status: Former Smoker    Packs/day: 1.00    Years: 20.00    Pack years: 20.00    Last attempt to quit: 03/19/1986    Years since quitting: 31.2  . Smokeless tobacco: Never Used  Substance Use Topics  . Alcohol use: No    Alcohol/week: 0.0 oz  . Drug use: No     Allergies   Dabigatran etexilate mesylate; Talwin [pentazocine]; Pentazocine lactate; Lactate; and Sulfonamide derivatives   Review of Systems Review of Systems  Constitutional: Negative for chills, diaphoresis and fatigue.  HENT: Negative for congestion.   Eyes: Negative for visual disturbance.  Respiratory: Negative for cough, chest tightness, shortness of breath, wheezing and stridor.   Cardiovascular: Negative for chest pain.  Gastrointestinal: Negative for abdominal pain, constipation, diarrhea, nausea and vomiting.  Genitourinary: Negative for enuresis and flank pain.  Musculoskeletal: Negative for back pain, neck pain and neck stiffness.  Neurological: Positive for speech  difficulty, weakness and numbness. Negative for dizziness, facial asymmetry, light-headedness and headaches.  Psychiatric/Behavioral: Negative for agitation.  All other systems reviewed and are negative.    Physical Exam Updated Vital Signs BP (!) 141/60 (BP Location: Left Arm)   Pulse 91   Temp 99.6 F (37.6 C) (Oral)   Resp 16  Ht 5' (1.524 m)   Wt 46.8 kg (103 lb 2.8 oz)   SpO2 100%   BMI 20.15 kg/m   Physical Exam  Constitutional: She is oriented to person, place, and time. She appears well-developed and well-nourished. No distress.  HENT:  Head: Normocephalic.  Nose: Nose normal.  Mouth/Throat: Oropharynx is clear and moist. No oropharyngeal exudate.  Eyes: Conjunctivae and EOM are normal. Pupils are equal, round, and reactive to light.  Neck: Normal range of motion. Neck supple.  Cardiovascular: Normal rate and intact distal pulses.  Murmur heard. Pulmonary/Chest: Effort normal. No respiratory distress. She has no wheezes. She has rhonchi. She has no rales. She exhibits no tenderness.  Abdominal: Soft. Bowel sounds are normal. She exhibits no distension and no mass. There is no tenderness.  Musculoskeletal: She exhibits no edema or tenderness.  Neurological: She is alert and oriented to person, place, and time. She displays normal reflexes. A sensory deficit is present. No cranial nerve deficit. She exhibits normal muscle tone. Coordination normal.  Skin: No rash noted. She is not diaphoretic. No erythema.  Psychiatric: She has a normal mood and affect.  Nursing note and vitals reviewed.    ED Treatments / Results  Labs (all labs ordered are listed, but only abnormal results are displayed) Labs Reviewed  APTT - Abnormal; Notable for the following components:      Result Value   aPTT 43 (*)    All other components within normal limits  CBC - Abnormal; Notable for the following components:   RBC 3.55 (*)    Hemoglobin 10.5 (*)    HCT 32.5 (*)    All other  components within normal limits  DIFFERENTIAL - Abnormal; Notable for the following components:   Lymphs Abs 0.6 (*)    All other components within normal limits  COMPREHENSIVE METABOLIC PANEL - Abnormal; Notable for the following components:   Chloride 96 (*)    BUN 28 (*)    Creatinine, Ser 1.38 (*)    Calcium 8.5 (*)    AST 59 (*)    ALT 64 (*)    Alkaline Phosphatase 204 (*)    GFR calc non Af Amer 37 (*)    GFR calc Af Amer 43 (*)    All other components within normal limits  RAPID URINE DRUG SCREEN, HOSP PERFORMED - Abnormal; Notable for the following components:   Opiates POSITIVE (*)    All other components within normal limits  URINALYSIS, ROUTINE W REFLEX MICROSCOPIC - Abnormal; Notable for the following components:   APPearance CLOUDY (*)    Specific Gravity, Urine <1.005 (*)    Hgb urine dipstick TRACE (*)    Nitrite POSITIVE (*)    Leukocytes, UA MODERATE (*)    All other components within normal limits  URINALYSIS, MICROSCOPIC (REFLEX) - Abnormal; Notable for the following components:   Bacteria, UA MANY (*)    Squamous Epithelial / LPF 0-5 (*)    All other components within normal limits  URINE CULTURE  CULTURE, BLOOD (ROUTINE X 2)  CULTURE, BLOOD (ROUTINE X 2)  ETHANOL  PROTIME-INR  TROPONIN I  HEPATITIS PANEL, ACUTE  CREATININE, URINE, RANDOM  UREA NITROGEN, URINE  BRAIN NATRIURETIC PEPTIDE  CBG MONITORING, ED    EKG  EKG Interpretation  Date/Time:  Thursday May 30 2017 16:53:04 EDT Ventricular Rate:  79 PR Interval:    QRS Duration: 110 QT Interval:  419 QTC Calculation: 481 R Axis:   96 Text Interpretation:  Atrial-sensed  ventricular-paced rhythm No further analysis attempted due to paced rhythm When compared to prior, no significant changes seen.  No STEMI Confirmed by Antony Blackbird [phone]) on 05/30/2017 5:10:28 PM       Radiology Ct Head Wo Contrast  Result Date: 05/30/2017 CLINICAL DATA:  72 y/o F; 2 days of right-sided weakness.  Slurred speech and confusion. EXAM: CT HEAD WITHOUT CONTRAST TECHNIQUE: Contiguous axial images were obtained from the base of the skull through the vertex without intravenous contrast. COMPARISON:  06/04/2011 CT head FINDINGS: Brain: No evidence of acute infarction, hemorrhage, hydrocephalus, extra-axial collection or mass lesion/mass effect. Few stable nonspecific foci of hypoattenuation are present in subcortical white matter compatible mild chronic microvascular ischemic changes. Vascular: Mild calcific atherosclerosis of carotid siphons. No hyperdense vessel identified. Skull: Normal. Negative for fracture or focal lesion. Sinuses/Orbits: Mild paranasal sinus mucosal thickening. Normal aeration of mastoid air cells. Bilateral intra-ocular lens replacement. Other: None. IMPRESSION: 1. No acute intracranial abnormality identified. 2. Stable mild chronic microvascular ischemic changes. 3. Mild paranasal sinus disease. Electronically Signed   By: Kristine Garbe M.D.   On: 05/30/2017 18:31    Procedures Procedures (including critical care time)  Medications Ordered in ED Medications  cefTRIAXone (ROCEPHIN) 1 g in sodium chloride 0.9 % 100 mL IVPB (not administered)     Initial Impression / Assessment and Plan / ED Course  I have reviewed the triage vital signs and the nursing notes.  Pertinent labs & imaging results that were available during my care of the patient were reviewed by me and considered in my medical decision making (see chart for details).     [name] is a 72 y.o. female with a past medical history significant for hypertension, CAD, prior SBO, CHF with pacemaker, atrial fibrillation, GERD, COPD on 3 L nasal cannula at baseline, prior GI bleed, and fibromyalgia who presents for neurologic complaint.  Patient says that her last normal was Tuesday, 2 days ago when going to bed.  She says that yesterday morning she woke up and was having weakness in her right arm and  right leg as well as speech problems.  She says that she could not grip a beverage or her cell phone.  She tried to walk and  her right leg was weak.  She also reported numbness in the right arm and right leg.  Family said that she was not able to express what she wanted to say although she did not appear to have slurred speech.  They report that her speech problem has been waxing and waning and the arm and leg symptoms were persistent.  Patient scheduled appointment today to see her primary doctor and shortly after arriving, was sent down to the emergency department for evaluation for possible stroke.  On my initial evaluation, patient and family say that her speech has resolved and she is no longer a phasic but she is still having numbness on her right arm and right leg.  On my exam, patient did not have weakness in the right arm and right leg.  Patient had normal coordination with finger-nose-finger.  Patient had normal strength and leg bilaterally and in arms bilaterally.  Normal grip strength bilaterally.  Normal pulses in all extremities.  Patient's lungs had coarse breath sounds which due to her chronic lung disease is likely normal for her.  Patient is on her home oxygen requirement of 3 L.  Patient had normal extraocular movements and reactive pupils bilaterally.  No facial droop.  Normal  object naming.  Patient was alert and oriented and had no dysarthria or expressive a aphasia.  Clinically I am concerned patient may be having a stroke or TIA.  Given the waxing and waning speech and weakness symptoms TIA is considered however with the persistent numbness.  Left brain is the likely culprit with the right extremity symptoms and speech problem.  Patient will have a head CT and laboratory testing to start.  We will not call code stroke as last normal was 2 days ago.  Anticipate speaking with neurology.  Anticipate transfer for MRI and likely admission for stroke/TIA.  8:40 PM Neurology was called and  they recommended patient be admitted to [facility] so that they can see the patient, she can have MRI/MRA of the head and neck, and so that she can be worked up for possible stroke/TIA.   CT scan showed no acute intracranial abnormality but there was evidence of stable mild chronic microvascular ischemic changes.  Workup also revealed UTI and Rocephin was ordered.  Patient is not appropriate for CTA given her worsened kidney function and GFR being low.  Patient will be admitted to a telemetry [facility] at [facility] for further management.   Final Clinical Impressions(s) / ED Diagnoses   Final diagnoses:  Acute cystitis without hematuria  Numbness  TIA (transient ischemic attack)     Clinical Impression: 1. Acute cystitis without hematuria   2. Numbness   3. TIA (transient ischemic attack)     Disposition: Admit  This note was prepared with assistance of Dragon voice recognition software. Occasional wrong-word or sound-a-like substitutions may have occurred due to the inherent limitations of voice recognition software.      Tegeler, Gwenyth Allegra, MD 05/31/17 0001    Tegeler, Gwenyth Allegra, MD 05/31/17 0002

## 2017-05-30 NOTE — H&P (Signed)
History and Physical    [name]:[id number] DOB: Jun 08, 1945 DOA: 05/30/2017  Referring MD/NP/PA:   PCP: Ann Held, DO   Patient coming from:  The patient is coming from home.  At baseline, pt is independent for most of ADL.   Chief Complaint: Right-sided numbness, weakness, aphagia   HPI: [name] is a 72 y.o. female with medical history significant of hypertension, COPD, pacemaker placement, LBBB, gastric ulcer, GI bleeding, dCHF, atrial fibrillation not on anticoagulant, anemia, who presents with left-sided numbness, weakness and aphasia.  Patient states that her symptoms started yesterday morning. She had right-sided numbness and weakness in both arms and legs. She also had aphasia. Patient states that her weakness and aphagia have resolved, but she still has numbness in R arm and leg. Patient does not have vision change or hearing loss. No facial droop. Patient has mild cough, but denies chest pain, SOBB, fever or chills. No nausea, vomiting, diarrhea or abdominal pain. Patient has mild dysuria and burning on urination recently.  ED Course: pt was found to have WBC 9.5, INR 1.13, PTT 43, negative troponin, UDS positive for opiates, positive urinalysis with small amount of leukocytes and positive nitrite, acute renal injury with creatinine 1.38, BUN 28, abnormal liver functions with ALP 204, AST 59, ALT 64, total bilirubin normal 0.6, temperature 99.6, heart rate in 90s, oxygen saturation 100% on room air, negative CT head for acute intracranial abnormalities. Patient is placed on telemetry bed for observation. Dr. a row for neurology was consulted.  Review of Systems:   General: no fevers, chills, no body weight gain, has fatigue HEENT: no blurry vision, hearing changes or sore throat Respiratory: no dyspnea, coughing, wheezing CV: no chest pain, no palpitations GI: no nausea, vomiting, abdominal pain, diarrhea, constipation GU: has dysuria, burning on  urination, increased urinary frequency, no hematuria  Ext: no leg edema Neuro: has right-sided numbness, weakness, aphagia  Skin: no rash, no skin tear. MSK: No muscle spasm, no deformity, no limitation of range of movement in spin Heme: No easy bruising.  Travel history: No recent long distant travel.  Allergy:  Allergies  Allergen Reactions  . Dabigatran Etexilate Mesylate Other (See Comments)    INTERNAL BLEEDING-pradaxa  . Talwin [Pentazocine] Other (See Comments)    hallucinations  . Lactate Rash    Past Medical History:  Diagnosis Date  . Anemia   . Anginal pain ([place])   . Arthritis    right hip  . Atrial fibrillation ([place])   . Blood transfusion   . CAD (coronary artery disease)    mild disease per cath in 2011  . CHF (congestive heart failure) ([place])   . Chronic abdominal pain   . Chronic nausea   . Chronic pain syndrome   . COPD (chronic obstructive pulmonary disease) (HCC)    continuous O2- 2l  . Diverticulitis   . Elbow fracture, left aug 2012  . Elevated liver enzymes   . Fibromyalgia    COUPLE OF TIMES A YEAR-PT HAS EPISODES OF CONFUSION-USUALLY INVOLVES DAY/NIGHT REVERSAL AND EPISODES OF TWITCHING AND FALLS--SEES DR. Jacelyn Grip - NEUROLOGIST-LAST SEEN 07/10/11  . Gastroparesis   . GERD (gastroesophageal reflux disease)   . H/O: GI bleed    from Pradaxa  . Hepatitis    hx of in high school   . HTN (hypertension)    under control; has been on med. x "years"  . Hx MRSA infection   . Hx of gastric ulcer   .  Hx of stress fx aug 2011   right hip   . LBBB (left bundle branch block)   . Oxygen dependent    2 liters via nasal cannula at all times  . Pacemaker    CRT therapy; followed by Dr. Caryl Comes  . Pleural effusion     s/p right thoracentesis 03/09  . Pneumonia - 03/09, 12/09, 02/10, 06/10   MOST RECENT FEB 2013  . Primary dilated cardiomyopathy (HCC)    EF 45 to 50% per echo in Jan 2012  . RLS (restless legs syndrome)   . Silent aspiration   . Small bowel  obstruction ([place])   . Urinary incontinence     -INTERSTIM IMPLANT NOT FUNCTIONING PER PT  . Venous embolism and thrombosis of subclavian vein ([place])    after pacemaker insertion Oct 2011  . Vitamin B12 deficiency     Past Surgical History:  Procedure Laterality Date  . APPENDECTOMY    . CARDIAC CATHETERIZATION  04/08/2006, 11/16/2009  . COLECTOMY     Intestinal resection (5times)  . COLECTOMY  02/04/2000   ex. lap., intra-abd. subtotal colectomy with ileosigmoid colon anastomosies and lysis of adhesions  . CYSTOSCOPY  11/11/2008  . CYSTOSCOPY W/ RETROGRADES  11/11/2008   right  . CYSTOSCOPY WITH INJECTION  04/30/2006   transurethral collagen injection; incision vaginal stenosis  . ESOPHAGOGASTRODUODENOSCOPY N/A 02/02/2013   Procedure: ESOPHAGOGASTRODUODENOSCOPY (EGD);  Surgeon: Lafayette Dragon, MD;  Location: [facility];  Service: Endoscopy;  Laterality: N/A;  . EXPLORATORY LAPAROTOMY  04/27/2009   lysis of adhesions, gastrostomy tube  . GASTROCUTANEOUS FISTULA CLOSURE    . INTERSTIM IMPLANT PLACEMENT  05/28/2006 - stage I   06/05/2006 - stage II  . INTERSTIM IMPLANT PLACEMENT  02/05/2012   Procedure: [name] IMPLANT FIRST STAGE;  Surgeon: Reece Packer, MD;  Location: [facility];  Service: Urology;  Laterality: N/A;  Replacement of Interstim Lead     . INTERSTIM IMPLANT REVISION  03/06/2011   Procedure: REVISION OF [name];  Surgeon: Reece Packer, MD;  Location: [facility];  Service: Urology;  Laterality: N/A;  Replacement of Neurostimulator  . INTERSTIM IMPLANT REVISION  10/23/2007  . PACEMAKER PLACEMENT  12/21/2009  . Peg removed     with complications  . PORT-A-CATH REMOVAL  07/27/2011   Procedure: REMOVAL PORT-A-CATH;  Surgeon: Rolm Bookbinder, MD;  Location: [facility];  Service: General;  Laterality: N/A;  . PORTACATH PLACEMENT  12/16/2009  . SAVORY DILATION N/A 02/02/2013   Procedure: SAVORY DILATION;  Surgeon: Lafayette Dragon, MD;  Location: [facility];   Service: Endoscopy;  Laterality: N/A;  . SMALL INTESTINE SURGERY  05/20/2001   ex. lap., resection of small bowel stricture; gastrostomy; insertion central line  . TONSILLECTOMY  age 85  . TOTAL ABDOMINAL HYSTERECTOMY     complete  . TOTAL HIP ARTHROPLASTY  08/03/2011   Procedure: TOTAL HIP ARTHROPLASTY ANTERIOR APPROACH;  Surgeon: Mcarthur Rossetti, MD;  Location: [facility];  Service: Orthopedics;  Laterality: Right;    Social History:  reports that she quit smoking about 31 years ago. She has a 20.00 pack-year smoking history. she has never used smokeless tobacco. She reports that she does not drink alcohol or use drugs.  Family History:  Family History  Problem Relation Age of Onset  . Heart disease Father   . Stroke Mother   . Heart disease Mother   . Heart disease Brother   . Breast cancer Maternal Aunt   . Heart disease  Paternal Aunt   . Diabetes Paternal Grandfather   . Colon cancer Neg Hx      Prior to Admission medications   Medication Sig Start Date End Date Taking? Authorizing Provider  AMBULATORY NON FORMULARY MEDICATION Pt takes Oxygen, 3 units, every day    [provider]  carvedilol (COREG) 25 MG tablet TAKE 1 TABLET BY MOUTH TWICE DAILY 01/31/17   Minus Breeding, MD  folic acid (FOLVITE) 1 MG tablet Take 1 mg by mouth daily.    [provider]  furosemide (LASIX) 40 MG tablet TAKE ONE TABLET BY MOUTH IN THE MORNING 11/27/16   Minus Breeding, MD  gabapentin (NEURONTIN) 600 MG tablet TAKE 1 TABLET BY MOUTH 4 TIMES DAILY 05/30/17   Carollee Herter, Alferd Apa, DO  nitroGLYCERIN (NITROSTAT) 0.4 MG SL tablet Place 1 tablet (0.4 mg total) under the tongue every 5 (five) minutes as needed. CHEST PAIN 05/15/17   Deboraha Sprang, MD  oxyCODONE-acetaminophen (PERCOCET) 10-325 MG tablet Take 1 tablet by mouth every 4 (four) hours as needed. 05/30/17   Ann Held, DO  promethazine (PHENERGAN) 25 MG tablet Take 1 tablet (25 mg total) by mouth every 8 (eight)  hours as needed. for nausea 02/21/16   Gatha Mayer, MD  Respiratory Therapy Supplies (FLUTTER) DEVI 1 application by Does not apply route 4 (four) times daily as needed. 04/03/17   Chesley Mires, MD  Tiotropium Bromide Monohydrate (SPIRIVA RESPIMAT) 1.25 MCG/ACT AERS Inhale 2 puffs into the lungs daily.    [provider]  tiZANidine (ZANAFLEX) 4 MG tablet Take 1 tablet (4 mg total) by mouth 3 (three) times daily as needed. for muscle spams 05/30/17   Carollee Herter, Alferd Apa, DO  zolpidem (AMBIEN) 5 MG tablet TAKE 1 TABLET BY MOUTH ONCE DAILY AT BEDTIME AS NEEDED FOR  SLEEP 03/07/17   Roma Schanz R, DO  iron dextran complex (INFED) 50 MG/ML injection Please give infed infusion (no test dose needed) over 4 hours per pharmacy calculated dose. Ht: 5'2 Wt: 99 pounds Hgb: 12 12/20/10 06/05/11  Lafayette Dragon, MD    Physical Exam: Vitals:   05/30/17 2100 05/30/17 2226 05/31/17 0000 05/31/17 0200  BP: (!) 164/71 (!) 141/60 (!) 151/64 (!) 143/66  Pulse: 87 91 85 80  Resp: 13 16  18   Temp:  99.6 F (37.6 C)    TempSrc:  Oral    SpO2: 100% 100% 99% 99%  Weight:  46.8 kg (103 lb 2.8 oz)    Height:  5' (1.524 m)     General: Not in acute distress HEENT:       Eyes: PERRL, EOMI, no scleral icterus.       ENT: No discharge from the ears and nose, no pharynx injection, no tonsillar enlargement.        Neck: No JVD, no bruit, no mass felt. Heme: No neck lymph node enlargement. Cardiac: S1/S2, RRR, No murmurs, No gallops or rubs. Respiratory: No rales, wheezing, rhonchi or rubs. GI: Soft, nondistended, nontender, no rebound pain, no organomegaly, BS present. GU: No hematuria Ext: No pitting leg edema bilaterally. 2+DP/PT pulse bilaterally. Musculoskeletal: No joint deformities, No joint redness or warmth, no limitation of ROM in spin. Skin: No rashes.  Neuro: Alert, oriented X3, cranial nerves II-XII grossly intact, moves all extremities normally. Muscle strength 5/5 in all  extremities, sensation to light touch intact. Brachial reflex 2+ bilaterally.  Psych: Patient is not psychotic, no suicidal or hemocidal ideation.  Labs on Admission: I have personally reviewed following labs and imaging studies  CBC: Recent Labs  Lab 05/30/17 1754  WBC 9.5  NEUTROABS 7.7  HGB 10.5*  HCT 32.5*  MCV 91.5  PLT 676   Basic Metabolic Panel: Recent Labs  Lab 05/30/17 1754  NA 135  K 4.0  CL 96*  CO2 30  GLUCOSE 83  BUN 28*  CREATININE 1.38*  CALCIUM 8.5*   GFR: Estimated Creatinine Clearance: 26.5 mL/min (A) (by C-G formula based on SCr of 1.38 mg/dL (H)). Liver Function Tests: Recent Labs  Lab 05/30/17 1754  AST 59*  ALT 64*  ALKPHOS 204*  BILITOT 0.6  PROT 7.3  ALBUMIN 3.7   No results for input(s): LIPASE, AMYLASE in the last 168 hours. No results for input(s): AMMONIA in the last 168 hours. Coagulation Profile: Recent Labs  Lab 05/30/17 1754  INR 1.13   Cardiac Enzymes: Recent Labs  Lab 05/30/17 1754  TROPONINI <0.03   BNP (last 3 results) No results for input(s): PROBNP in the last 8760 hours. HbA1C: No results for input(s): HGBA1C in the last 72 hours. CBG: Recent Labs  Lab 05/30/17 1757  GLUCAP 71   Lipid Profile: No results for input(s): CHOL, HDL, LDLCALC, TRIG, CHOLHDL, LDLDIRECT in the last 72 hours. Thyroid Function Tests: No results for input(s): TSH, T4TOTAL, FREET4, T3FREE, THYROIDAB in the last 72 hours. Anemia Panel: No results for input(s): VITAMINB12, FOLATE, FERRITIN, TIBC, IRON, RETICCTPCT in the last 72 hours. Urine analysis:    Component Value Date/Time   COLORURINE YELLOW 05/30/2017 1940   APPEARANCEUR CLOUDY (A) 05/30/2017 1940   LABSPEC <1.005 (L) 05/30/2017 1940   PHURINE 6.0 05/30/2017 1940   GLUCOSEU NEGATIVE 05/30/2017 1940   GLUCOSEU NEGATIVE 07/24/2011 0908   HGBUR TRACE (A) 05/30/2017 1940   HGBUR negative 05/19/2010 1609   BILIRUBINUR NEGATIVE 05/30/2017 1940   BILIRUBINUR neg 03/07/2017  1224   KETONESUR NEGATIVE 05/30/2017 1940   PROTEINUR NEGATIVE 05/30/2017 1940   UROBILINOGEN 0.2 03/07/2017 1224   UROBILINOGEN 0.2 05/17/2014 1217   NITRITE POSITIVE (A) 05/30/2017 1940   LEUKOCYTESUR MODERATE (A) 05/30/2017 1940   Sepsis Labs: @LABRCNTIP (procalcitonin:4,lacticidven:4) )No results found for this or any previous visit (from the past 240 hour(s)).   Radiological Exams on Admission: Ct Head Wo Contrast  Result Date: 05/30/2017 CLINICAL DATA:  72 y/o F; 2 days of right-sided weakness. Slurred speech and confusion. EXAM: CT HEAD WITHOUT CONTRAST TECHNIQUE: Contiguous axial images were obtained from the base of the skull through the vertex without intravenous contrast. COMPARISON:  06/04/2011 CT head FINDINGS: Brain: No evidence of acute infarction, hemorrhage, hydrocephalus, extra-axial collection or mass lesion/mass effect. Few stable nonspecific foci of hypoattenuation are present in subcortical white matter compatible mild chronic microvascular ischemic changes. Vascular: Mild calcific atherosclerosis of carotid siphons. No hyperdense vessel identified. Skull: Normal. Negative for fracture or focal lesion. Sinuses/Orbits: Mild paranasal sinus mucosal thickening. Normal aeration of mastoid air cells. Bilateral intra-ocular lens replacement. Other: None. IMPRESSION: 1. No acute intracranial abnormality identified. 2. Stable mild chronic microvascular ischemic changes. 3. Mild paranasal sinus disease. Electronically Signed   By: Kristine Garbe M.D.   On: 05/30/2017 18:31     EKG: Independently reviewed.  Sinus rhythm, paced rhythm   Assessment/Plan Principal Problem:   TIA (transient ischemic attack) Active Problems:   Essential hypertension   Atrial fibrillation/flutter   COPD with emphysema (HCC)   Anemia   UTI (urinary tract infection)   Focal neurological deficit  Chronic diastolic CHF (congestive heart failure) (HCC)   AKI (acute kidney injury) (HCC)    Abnormal LFTs   TIA vs. Stroke: Patient's symptoms are concerning for TIA. Since patient has persistent numbness in right side, cannot completely rule out stroke. CT head is negative for acute intracranial abnormalities. Patient cannot do MRI due to pacemaker. Since patient has acute renal injury, will not order CT angiogram of head or neck, will hydrate patient tonight, and repeated BMP in AM. If renal function is okay in morning, then may get CT angiogram in morning. Dr. Rory Percy was consulted. Pt has hx of A fib, but cannot use AC due to hx of GIB. I discussed with pt about starting ASA. She agreed to start ASA with understanding this may cause GIB again. Will put pt on protonix 40 mg bid.  - will place to tele bed - Neurology was consulted by EDP, will follow up recommendations. - Risk factor modification: HgbA1c, fasting lipid panel - PT consult, OT consult - Bedside swallowing screen was ordered, will get speech consult in AM - 2 d Echocardiogram  - Carotid dopplers  - will start Aspirin - will not start statin due to abnormal LFT - protonix 40 mg bid  HTN:  -hold lasix allow permissive hypertension -continue coreg which is for A fib -IV hydralazine prn for SBP>220  Atrial Fibrillation: CHA2DS2-VASc Score is 6, needs oral anticoagulation, but pt is not on AC due to hx of GIB. Heart rate is well controlled. -continue coreg  COPD with emphysema ([place]): stable. -continue Spirava inhaler -prn albuterol nebs  UTI: -IV rocephin -f/u Bx and Ux  Chronic diastolic CHF (congestive heart failure) ([place]): 2-D echo on 05/18/14 showed EF 60-65% with grade 1 diastolic dysfunction. Patient does not have leg edema. No respiratory distress. CHF is compensated. -Hold Lasix due to acute renal injury. History check  BNP  AKI: Likely due to prerenal secondary to dehydration and continuation of diuretics and UTI - IVF: 75 cc/h of NS - Check FeUrea - Follow up renal function by BMP - Hold  lasix  Abnormal LFTs: mild transaminase elevation -avoid using tylenol -will not start Statin -switch home perocet to prn oxycodone for pain -check hepatitis panel  DVT ppx: SQ Lovenox Code Status: Full code Family Communication: None at bed side.    Disposition Plan:  Anticipate discharge back to previous home environment Consults called:  Dr. Rory Percy of Neuro Admission status: Obs / tele    Date of Service 05/31/2017    Ivor Costa [facility] Pager [phone]  If 7PM-7AM, please contact night-coverage [email or web address] Password [email or web address] 05/31/2017, 2:06 AM

## 2017-05-30 NOTE — Addendum Note (Signed)
Addended by: Roma Schanz R on: 05/30/2017 05:00 PM   Modules accepted: Orders

## 2017-05-30 NOTE — ED Triage Notes (Signed)
Pt having right sided weakness since yesterday morning at 0730.  Last known normal was [id number] when she went to bed.  Weakness has lessened but still present today. Pt also having right sided numbness/tingling which has not changes.  Family noticed some slurred speech and some confusion when speaking yesterday.  Pt has dropped things in her grasp as well.  Pt awake and alert.  No acute distress noted.

## 2017-05-30 NOTE — Assessment & Plan Note (Signed)
Slurred speech resolved Pt still with R low ext weakness D/w with [facility] EDP-- pt sent to ER for further eval for cva / tia

## 2017-05-31 ENCOUNTER — Observation Stay (HOSPITAL_COMMUNITY): Payer: PPO

## 2017-05-31 ENCOUNTER — Encounter: Payer: Self-pay | Admitting: Neurology

## 2017-05-31 DIAGNOSIS — K219 Gastro-esophageal reflux disease without esophagitis: Secondary | ICD-10-CM | POA: Diagnosis present

## 2017-05-31 DIAGNOSIS — Z9981 Dependence on supplemental oxygen: Secondary | ICD-10-CM | POA: Diagnosis not present

## 2017-05-31 DIAGNOSIS — I2510 Atherosclerotic heart disease of native coronary artery without angina pectoris: Secondary | ICD-10-CM | POA: Diagnosis present

## 2017-05-31 DIAGNOSIS — Z86718 Personal history of other venous thrombosis and embolism: Secondary | ICD-10-CM | POA: Diagnosis not present

## 2017-05-31 DIAGNOSIS — Z8614 Personal history of Methicillin resistant Staphylococcus aureus infection: Secondary | ICD-10-CM | POA: Diagnosis not present

## 2017-05-31 DIAGNOSIS — R4781 Slurred speech: Secondary | ICD-10-CM | POA: Diagnosis not present

## 2017-05-31 DIAGNOSIS — I110 Hypertensive heart disease with heart failure: Secondary | ICD-10-CM | POA: Diagnosis present

## 2017-05-31 DIAGNOSIS — I420 Dilated cardiomyopathy: Secondary | ICD-10-CM | POA: Diagnosis present

## 2017-05-31 DIAGNOSIS — J439 Emphysema, unspecified: Secondary | ICD-10-CM | POA: Diagnosis present

## 2017-05-31 DIAGNOSIS — I10 Essential (primary) hypertension: Secondary | ICD-10-CM | POA: Diagnosis not present

## 2017-05-31 DIAGNOSIS — G459 Transient cerebral ischemic attack, unspecified: Secondary | ICD-10-CM | POA: Diagnosis not present

## 2017-05-31 DIAGNOSIS — I4892 Unspecified atrial flutter: Secondary | ICD-10-CM | POA: Diagnosis present

## 2017-05-31 DIAGNOSIS — I4891 Unspecified atrial fibrillation: Secondary | ICD-10-CM | POA: Diagnosis present

## 2017-05-31 DIAGNOSIS — G8191 Hemiplegia, unspecified affecting right dominant side: Secondary | ICD-10-CM | POA: Diagnosis present

## 2017-05-31 DIAGNOSIS — K3184 Gastroparesis: Secondary | ICD-10-CM | POA: Diagnosis present

## 2017-05-31 DIAGNOSIS — I5032 Chronic diastolic (congestive) heart failure: Secondary | ICD-10-CM | POA: Diagnosis present

## 2017-05-31 DIAGNOSIS — I447 Left bundle-branch block, unspecified: Secondary | ICD-10-CM | POA: Diagnosis present

## 2017-05-31 DIAGNOSIS — N179 Acute kidney failure, unspecified: Secondary | ICD-10-CM | POA: Diagnosis present

## 2017-05-31 DIAGNOSIS — R4701 Aphasia: Secondary | ICD-10-CM | POA: Diagnosis present

## 2017-05-31 DIAGNOSIS — G2581 Restless legs syndrome: Secondary | ICD-10-CM | POA: Diagnosis present

## 2017-05-31 DIAGNOSIS — Z950 Presence of cardiac pacemaker: Secondary | ICD-10-CM | POA: Diagnosis not present

## 2017-05-31 DIAGNOSIS — G894 Chronic pain syndrome: Secondary | ICD-10-CM | POA: Diagnosis present

## 2017-05-31 DIAGNOSIS — N3000 Acute cystitis without hematuria: Secondary | ICD-10-CM | POA: Diagnosis present

## 2017-05-31 DIAGNOSIS — Z8711 Personal history of peptic ulcer disease: Secondary | ICD-10-CM | POA: Diagnosis not present

## 2017-05-31 DIAGNOSIS — M797 Fibromyalgia: Secondary | ICD-10-CM | POA: Diagnosis present

## 2017-05-31 DIAGNOSIS — R945 Abnormal results of liver function studies: Secondary | ICD-10-CM | POA: Diagnosis not present

## 2017-05-31 DIAGNOSIS — I480 Paroxysmal atrial fibrillation: Secondary | ICD-10-CM | POA: Diagnosis not present

## 2017-05-31 DIAGNOSIS — M810 Age-related osteoporosis without current pathological fracture: Secondary | ICD-10-CM | POA: Diagnosis present

## 2017-05-31 LAB — CBC WITH DIFFERENTIAL/PLATELET
Basophils Absolute: 0.1 10*3/uL (ref 0.0–0.1)
Basophils Relative: 1 %
Eosinophils Absolute: 0.2 10*3/uL (ref 0.0–0.7)
Eosinophils Relative: 3 %
HCT: 31.7 % — ABNORMAL LOW (ref 36.0–46.0)
Hemoglobin: 10.3 g/dL — ABNORMAL LOW (ref 12.0–15.0)
Lymphocytes Relative: 13 %
Lymphs Abs: 1 10*3/uL (ref 0.7–4.0)
MCH: 29.6 pg (ref 26.0–34.0)
MCHC: 32.5 g/dL (ref 30.0–36.0)
MCV: 91.1 fL (ref 78.0–100.0)
Monocytes Absolute: 0.7 10*3/uL (ref 0.1–1.0)
Monocytes Relative: 9 %
Neutro Abs: 5.7 10*3/uL (ref 1.7–7.7)
Neutrophils Relative %: 74 %
Platelets: 217 10*3/uL (ref 150–400)
RBC: 3.48 MIL/uL — ABNORMAL LOW (ref 3.87–5.11)
RDW: 14.7 % (ref 11.5–15.5)
WBC: 7.7 10*3/uL (ref 4.0–10.5)

## 2017-05-31 LAB — COMPREHENSIVE METABOLIC PANEL
ALT: 44 U/L (ref 14–54)
AST: 34 U/L (ref 15–41)
Albumin: 3.1 g/dL — ABNORMAL LOW (ref 3.5–5.0)
Alkaline Phosphatase: 165 U/L — ABNORMAL HIGH (ref 38–126)
Anion gap: 11 (ref 5–15)
BUN: 18 mg/dL (ref 6–20)
CO2: 24 mmol/L (ref 22–32)
Calcium: 8.2 mg/dL — ABNORMAL LOW (ref 8.9–10.3)
Chloride: 104 mmol/L (ref 101–111)
Creatinine, Ser: 0.95 mg/dL (ref 0.44–1.00)
GFR calc Af Amer: >60 mL/min (ref >60)
GFR calc non Af Amer: 58 mL/min — ABNORMAL LOW (ref >60)
Glucose, Bld: 102 mg/dL — ABNORMAL HIGH (ref 65–99)
Potassium: 4.1 mmol/L (ref 3.5–5.1)
Sodium: 139 mmol/L (ref 135–145)
Total Bilirubin: 0.3 mg/dL (ref 0.3–1.2)
Total Protein: 6.2 g/dL — ABNORMAL LOW (ref 6.5–8.1)

## 2017-05-31 LAB — LIPID PANEL
Cholesterol: 140 mg/dL (ref 0–200)
HDL: 43 mg/dL (ref >40)
LDL Cholesterol: 84 mg/dL (ref 0–99)
Total CHOL/HDL Ratio: 3.3 RATIO
Triglycerides: 67 mg/dL (ref <150)
VLDL: 13 mg/dL (ref 0–40)

## 2017-05-31 LAB — CREATININE, URINE, RANDOM: Creatinine, Urine: 46.02 mg/dL

## 2017-05-31 LAB — HEMOGLOBIN A1C
Hgb A1c MFr Bld: 5.2 % (ref 4.8–5.6)
Mean Plasma Glucose: 102.54 mg/dL

## 2017-05-31 LAB — BRAIN NATRIURETIC PEPTIDE: B Natriuretic Peptide: 280.2 pg/mL — ABNORMAL HIGH (ref 0.0–100.0)

## 2017-05-31 MED ORDER — ATORVASTATIN CALCIUM 10 MG PO TABS
20.0000 mg | ORAL_TABLET | Freq: Every day | ORAL | Status: DC
  Administered 2017-05-31: 20 mg via ORAL
  Filled 2017-05-31: qty 2

## 2017-05-31 MED ORDER — IOPAMIDOL (ISOVUE-370) INJECTION 76%
INTRAVENOUS | Status: AC
Start: 2017-05-31 — End: 2017-05-31
  Administered 2017-05-31: 50 mL
  Filled 2017-05-31: qty 50

## 2017-05-31 MED ORDER — CARVEDILOL 12.5 MG PO TABS
25.0000 mg | ORAL_TABLET | Freq: Two times a day (BID) | ORAL | Status: DC
  Administered 2017-05-31 – 2017-06-01 (×3): 25 mg via ORAL
  Filled 2017-05-31 (×3): qty 2

## 2017-05-31 MED ORDER — SENNOSIDES-DOCUSATE SODIUM 8.6-50 MG PO TABS
1.0000 | ORAL_TABLET | Freq: Every evening | ORAL | Status: DC | PRN
Start: 2017-05-31 — End: 2017-06-01

## 2017-05-31 MED ORDER — ENOXAPARIN SODIUM 30 MG/0.3ML ~~LOC~~ SOLN
30.0000 mg | Freq: Every day | SUBCUTANEOUS | Status: DC
  Administered 2017-05-31: 30 mg via SUBCUTANEOUS
  Filled 2017-05-31 (×2): qty 0.3

## 2017-05-31 MED ORDER — APIXABAN 5 MG PO TABS
5.0000 mg | ORAL_TABLET | Freq: Two times a day (BID) | ORAL | Status: DC
  Administered 2017-05-31 – 2017-06-01 (×2): 5 mg via ORAL
  Filled 2017-05-31 (×2): qty 1

## 2017-05-31 MED ORDER — ALBUTEROL SULFATE (2.5 MG/3ML) 0.083% IN NEBU: 2.5000 mg | INHALATION_SOLUTION | RESPIRATORY_TRACT | Status: DC | PRN

## 2017-05-31 MED ORDER — STROKE: EARLY STAGES OF RECOVERY BOOK
Freq: Once | Status: AC
  Administered 2017-05-31

## 2017-05-31 NOTE — Progress Notes (Signed)
05/31/2017 PT evaluation completed.  Full note to follow.  Pt is generally weak, so I recommended she get her RW out until she feels stronger, but from a mobility stand point she does not need f/u therapy or any equipment (for PT).  PT to follow acutely.  Thanks,  Barbarann Ehlers. [place], [place], DPT [phone]

## 2017-05-31 NOTE — Progress Notes (Signed)
SLP Cancellation Note  Patient Details Name: [name] MRN: [id number] DOB: Oct 20, 1945   Cancelled treatment:       Reason Eval/Treat Not Completed: SLP screened, no needs identified, will sign off   Harriet Bollen, Katherene Ponto 05/31/2017, 11:17 AM

## 2017-05-31 NOTE — Evaluation (Signed)
Physical Therapy Evaluation Patient Details Name: [name] MRN: [id number] DOB: 1945-03-28 Today's Date: 05/31/2017   History of Present Illness  72 year old female with history of hypertension, COPD, pacemaker placement, LBBB, gastric ulcer, GI bleeding, diastolic CHF, atrial fibrillation not on anticoagulants, anemia presented on 3/14 with right-sided numbness, weakness and slurred speech.  Initial CT of the head was negative for acute intracranial abnormality.   Pt cannot have an MRI due to pacemaker.    Clinical Impression  This pt is generally weak and deconditioned, but did well walking in the hallway with the RW (she is usually independent at home, but does own a walker that she uses when she needs to).  She would benefit from acute therapy, but will likely recover without therapy f/u at discharge.   PT to follow acutely for deficits listed below.       Follow Up Recommendations No PT follow up    Equipment Recommendations  None recommended by PT    Recommendations for Other Services   NA    Precautions / Restrictions Precautions Precautions: Fall Precaution Comments: mildly unsteady on her feet.       Mobility  Bed Mobility Overal bed mobility: Modified Independent             General bed mobility comments: Able to get EOB with rail and HOB mildly elevated.   Transfers Overall transfer level: Needs assistance Equipment used: None Transfers: Sit to/from Stand Sit to Stand: Min guard         General transfer comment: Min guard assist for safety  Ambulation/Gait Ambulation/Gait assistance: Min guard Ambulation Distance (Feet): 150 Feet Assistive device: Rolling walker (2 wheeled) Gait Pattern/deviations: Step-through pattern;Shuffle Gait velocity: decreased   General Gait Details: Pt started out without RW, but required min assist for balance and was reaching for objects in the room for balance.  We quickly switched to the RW and she felt safer and  more stable, requiringing only min guard assist for the occasional balance check.        Modified Rankin (Stroke Patients Only) Modified Rankin (Stroke Patients Only) Pre-Morbid Rankin Score: No significant disability Modified Rankin: Moderately severe disability     Balance Overall balance assessment: Mild deficits observed, not formally tested                                           Pertinent Vitals/Pain Pain Assessment: Faces Faces Pain Scale: Hurts little more Pain Location: generalized chronic back and fibromyalgia pain.  Pain Descriptors / Indicators: Aching;Burning Pain Intervention(s): Limited activity within patient's tolerance;Monitored during session;Repositioned    Home Living Family/patient expects to be discharged to:: Private residence Living Arrangements: Spouse/significant other Available Help at Discharge: Family Type of Home: House Home Access: Stairs to enter   Technical brewer of Steps: 1 Home Layout: One level Home Equipment: Environmental consultant - 2 wheels;Cane - single point;Shower seat      Prior Function Level of Independence: Independent         Comments: does not typically use the cane or walker at baseline unless she is sick.      Hand Dominance   Dominant Hand: Right    Extremity/Trunk Assessment   Upper Extremity Assessment Upper Extremity Assessment: Defer to OT evaluation    Lower Extremity Assessment Lower Extremity Assessment: Generalized weakness(no obvious signs of asymmetry during gait)    Cervical /  Trunk Assessment Cervical / Trunk Assessment: Kyphotic  Communication   Communication: No difficulties  Cognition Arousal/Alertness: Awake/alert Behavior During Therapy: WFL for tasks assessed/performed Overall Cognitive Status: Within Functional Limits for tasks assessed                                               Assessment/Plan    PT Assessment Patient needs continued PT  services  PT Problem List Decreased strength;Decreased activity tolerance;Decreased balance;Decreased mobility       PT Treatment Interventions DME instruction;Gait training;Functional mobility training;Stair training;Therapeutic activities;Therapeutic exercise;Balance training;Patient/family education    PT Goals (Current goals can be found in the Care Plan section)  Acute Rehab PT Goals Patient Stated Goal: to go home and feel back to normal PT Goal Formulation: With patient/family Time For Goal Achievement: 06/14/17 Potential to Achieve Goals: Good    Frequency Min 3X/week           AM-PAC PT "6 Clicks" Daily Activity  Outcome Measure Difficulty turning over in bed (including adjusting bedclothes, sheets and blankets)?: A Little Difficulty moving from lying on back to sitting on the side of the bed? : A Little Difficulty sitting down on and standing up from a chair with arms (e.g., wheelchair, bedside commode, etc,.)?: Unable Help needed moving to and from a bed to chair (including a wheelchair)?: A Little Help needed walking in hospital room?: A Little Help needed climbing 3-5 steps with a railing? : A Little 6 Click Score: 16    End of Session Equipment Utilized During Treatment: Gait belt;Oxygen(3 L O2 [place] at baseline used for gait in the hallway) Activity Tolerance: Patient limited by fatigue Patient left: in bed;with family/visitor present   PT Visit Diagnosis: Muscle weakness (generalized) (M62.81);Difficulty in walking, not elsewhere classified (R26.2)    Time: 0623-7628 PT Time Calculation (min) (ACUTE ONLY): 16 min   Charges:        [place] Guiles B. Bearett Porcaro, PT, DPT [phone]   PT Evaluation $PT Eval Moderate Complexity: 1 Mod     05/31/2017, 9:54 PM

## 2017-05-31 NOTE — Consult Note (Addendum)
Requesting Physician: Dr. Sherry Ruffing    Chief Complaint: Slurred speech and right side weakness  History obtained from: Patient and Chart     HPI:                                                                                                                                       [name] is an 72 y.o. female  with past medical history significant for atrial fibrillation not on anticoagulation, hypertension, LBBB with pacemaker placement, gastric ulcer, GI bleed presents [facility] ER with history of slurred speech and right-arm and leg weakness and numbness that she noticed on waking up Wednesday 05/28/17.   She states she got up and while talking on the phone she noticed her speech was more slurred. She also noticed that her right arm felt numb and dropping things. She waited to see her PCP today who sent her to ER for evaluation. She is not on ASA/AC after having severe GI  Bleeding requiring transfusion few years ago. Previously on Pradexa.   Date last known well: 3.12.19 tPA Given: no, outside window NIHSS: 0 Baseline MRS 0     Past Medical History:  Diagnosis Date  . Anemia   . Anginal pain ([place])   . Arthritis    right hip  . Atrial fibrillation ([place])   . Blood transfusion   . CAD (coronary artery disease)    mild disease per cath in 2011  . CHF (congestive heart failure) ([place])   . Chronic abdominal pain   . Chronic nausea   . Chronic pain syndrome   . COPD (chronic obstructive pulmonary disease) (HCC)    continuous O2- 2l  . Diverticulitis   . Elbow fracture, left aug 2012  . Elevated liver enzymes   . Fibromyalgia    COUPLE OF TIMES A YEAR-PT HAS EPISODES OF CONFUSION-USUALLY INVOLVES DAY/NIGHT REVERSAL AND EPISODES OF TWITCHING AND FALLS--SEES DR. Jacelyn Grip - NEUROLOGIST-LAST SEEN 07/10/11  . Gastroparesis   . GERD (gastroesophageal reflux disease)   . H/O: GI bleed    from Pradaxa  . Hepatitis    hx of in high school   . HTN (hypertension)     under control; has been on med. x "years"  . Hx MRSA infection   . Hx of gastric ulcer   . Hx of stress fx aug 2011   right hip   . LBBB (left bundle branch block)   . Oxygen dependent    2 liters via nasal cannula at all times  . Pacemaker    CRT therapy; followed by Dr. Caryl Comes  . Pleural effusion     s/p right thoracentesis 03/09  . Pneumonia - 03/09, 12/09, 02/10, 06/10   MOST RECENT FEB 2013  . Primary dilated cardiomyopathy (HCC)    EF 45 to 50% per echo in Jan 2012  . RLS (restless legs syndrome)   .  Silent aspiration   . Small bowel obstruction ([place])   . Urinary incontinence     -INTERSTIM IMPLANT NOT FUNCTIONING PER PT  . Venous embolism and thrombosis of subclavian vein ([place])    after pacemaker insertion Oct 2011  . Vitamin B12 deficiency     Past Surgical History:  Procedure Laterality Date  . APPENDECTOMY    . CARDIAC CATHETERIZATION  04/08/2006, 11/16/2009  . COLECTOMY     Intestinal resection (5times)  . COLECTOMY  02/04/2000   ex. lap., intra-abd. subtotal colectomy with ileosigmoid colon anastomosies and lysis of adhesions  . CYSTOSCOPY  11/11/2008  . CYSTOSCOPY W/ RETROGRADES  11/11/2008   right  . CYSTOSCOPY WITH INJECTION  04/30/2006   transurethral collagen injection; incision vaginal stenosis  . ESOPHAGOGASTRODUODENOSCOPY N/A 02/02/2013   Procedure: ESOPHAGOGASTRODUODENOSCOPY (EGD);  Surgeon: Lafayette Dragon, MD;  Location: [facility];  Service: Endoscopy;  Laterality: N/A;  . EXPLORATORY LAPAROTOMY  04/27/2009   lysis of adhesions, gastrostomy tube  . GASTROCUTANEOUS FISTULA CLOSURE    . INTERSTIM IMPLANT PLACEMENT  05/28/2006 - stage I   06/05/2006 - stage II  . INTERSTIM IMPLANT PLACEMENT  02/05/2012   Procedure: [name] IMPLANT FIRST STAGE;  Surgeon: Reece Packer, MD;  Location: [facility];  Service: Urology;  Laterality: N/A;  Replacement of Interstim Lead     . INTERSTIM IMPLANT REVISION  03/06/2011   Procedure: REVISION OF [name];  Surgeon:  Reece Packer, MD;  Location: [facility];  Service: Urology;  Laterality: N/A;  Replacement of Neurostimulator  . INTERSTIM IMPLANT REVISION  10/23/2007  . PACEMAKER PLACEMENT  12/21/2009  . Peg removed     with complications  . PORT-A-CATH REMOVAL  07/27/2011   Procedure: REMOVAL PORT-A-CATH;  Surgeon: Rolm Bookbinder, MD;  Location: [facility];  Service: General;  Laterality: N/A;  . PORTACATH PLACEMENT  12/16/2009  . SAVORY DILATION N/A 02/02/2013   Procedure: SAVORY DILATION;  Surgeon: Lafayette Dragon, MD;  Location: [facility];  Service: Endoscopy;  Laterality: N/A;  . SMALL INTESTINE SURGERY  05/20/2001   ex. lap., resection of small bowel stricture; gastrostomy; insertion central line  . TONSILLECTOMY  age 69  . TOTAL ABDOMINAL HYSTERECTOMY     complete  . TOTAL HIP ARTHROPLASTY  08/03/2011   Procedure: TOTAL HIP ARTHROPLASTY ANTERIOR APPROACH;  Surgeon: Mcarthur Rossetti, MD;  Location: [facility];  Service: Orthopedics;  Laterality: Right;    Family History  Problem Relation Age of Onset  . Heart disease Father   . Stroke Mother   . Heart disease Mother   . Heart disease Brother   . Breast cancer Maternal Aunt   . Heart disease Paternal Aunt   . Diabetes Paternal Grandfather   . Colon cancer Neg Hx    Social History:  reports that she quit smoking about 31 years ago. She has a 20.00 pack-year smoking history. she has never used smokeless tobacco. She reports that she does not drink alcohol or use drugs.  Allergies:  Allergies  Allergen Reactions  . Dabigatran Etexilate Mesylate Other (See Comments)    INTERNAL BLEEDING-pradaxa  . Talwin [Pentazocine] Other (See Comments)    hallucinations  . Lactate Rash    Medications:  I reviewed home medications   ROS:                                                                                                                                      14 systems reviewed and negative except above    Examination:                                                                                                      General: Appears well-developed and well-nourished.  Psych: Affect appropriate to situation Eyes: No scleral injection HENT: No OP obstrucion Head: Normocephalic.  Cardiovascular: Normal rate and regular rhythm.  Respiratory: Effort normal and breath sounds normal to anterior ascultation GI: Soft.  No distension. There is no tenderness.  Skin: WDI   Neurological Examination Mental Status: Alert, oriented, thought content appropriate.  Speech fluent without evidence of aphasia. Able to follow 3 step commands without difficulty. Cranial Nerves: II: Visual fields grossly normal,  III,IV, VI: ptosis not present, extra-ocular motions intact bilaterally, pupils equal, round, reactive to light and accommodation V,VII: smile symmetric, facial light touch sensation normal bilaterally VIII: hearing normal bilaterally IX,X: uvula rises symmetrically XI: bilateral shoulder shrug XII: midline tongue extension Motor: Right : Upper extremity   5/5    Left:     Upper extremity   5/5  Lower extremity   5/5     Lower extremity   5/5 Tone and bulk:normal tone throughout; no atrophy noted Sensory: Pinprick and light touch intact throughout, bilaterally Deep Tendon Reflexes: 2+ and symmetric throughout Plantars: Right: downgoing   Left: downgoing Cerebellar: normal finger-to-nose, normal rapid alternating movements and normal heel-to-shin test Gait: normal gait and station     Lab Results: Basic Metabolic Panel: Recent Labs  Lab 05/30/17 1754  NA 135  K 4.0  CL 96*  CO2 30  GLUCOSE 83  BUN 28*  CREATININE 1.38*  CALCIUM 8.5*    CBC: Recent Labs  Lab 05/30/17 1754  WBC 9.5  NEUTROABS 7.7  HGB 10.5*  HCT 32.5*  MCV 91.5  PLT 198    Coagulation Studies: Recent  Labs    05/30/17 1754  LABPROT 14.4  INR 1.13    Imaging: Ct Head Wo Contrast  Result Date: 05/30/2017 CLINICAL DATA:  72 y/o F; 2 days of right-sided weakness. Slurred speech and confusion. EXAM: CT HEAD WITHOUT CONTRAST TECHNIQUE: Contiguous axial images were obtained from the base of the skull through the vertex without intravenous contrast. COMPARISON:  06/04/2011  CT head FINDINGS: Brain: No evidence of acute infarction, hemorrhage, hydrocephalus, extra-axial collection or mass lesion/mass effect. Few stable nonspecific foci of hypoattenuation are present in subcortical white matter compatible mild chronic microvascular ischemic changes. Vascular: Mild calcific atherosclerosis of carotid siphons. No hyperdense vessel identified. Skull: Normal. Negative for fracture or focal lesion. Sinuses/Orbits: Mild paranasal sinus mucosal thickening. Normal aeration of mastoid air cells. Bilateral intra-ocular lens replacement. Other: None. IMPRESSION: 1. No acute intracranial abnormality identified. 2. Stable mild chronic microvascular ischemic changes. 3. Mild paranasal sinus disease. Electronically Signed   By: Kristine Garbe M.D.   On: 05/30/2017 18:31     ASSESSMENT AND PLAN   22 y F with past medical history significant for atrial fibrillation not on anticoagulation, hypertension, LBBB with pacemaker placement, gastric ulcer, GI bleed who presents with slurred speech and right-sided weakness that gradually improved over the last day.CT head shows no obvious acute ischemic stroke.  She is also the window for TPA/symptoms are mild.    Acute ischemic stroke/TIA  Risk factors: afib, HTN Etiology: suspect cardioembolic   Recommend # MRI of the brain without contrast and MRA Head and neck ( If pacemaker is MRI compatible) #Carotid Doppler, unable to do CTA head and neck due to AKI #Transthoracic Echo  # Start patient on ASA 325mg  daily #Start or continue Atorvastatin 80 mg/other high  intensity statin # BP goal: permissive HTN upto 254/982 systolic # HBAIC and Lipid profile # Telemetry monitoring # Frequent neuro checks # stroke swallow screen  Atrial fibrillation High CHADSVASC score of 6 GI consult to assess and evaluate risk of restarting anticoagulation, also may consider Left atrial appendage closure if  long term AC is high risk  for life threatening GI bleed.   UTI AKI   Please page stroke NP  Or  PA  Or MD from 8am -4 pm  as this patient from this time will be  followed by the stroke.   You can look them up on [email or web address]  Password [facility]   [name] [facility] Pager Number [phone]

## 2017-05-31 NOTE — Evaluation (Addendum)
Occupational Therapy Evaluation Patient Details Name: [name] MRN: [id number] DOB: 07-08-1945 Today's Date: 05/31/2017    History of Present Illness 72 year old female with history of hypertension, COPD, pacemaker placement, LBBB, gastric ulcer, GI bleeding, diastolic CHF, atrial fibrillation not on anticoagulants, anemia presented on 3/14 with right-sided numbness, weakness and slurred speech.  Initial CT of the head was negative for acute intracranial abnormality.    Clinical Impression   This 72 y/o F presents with the above. At baseline Pt is independent with ADLs and functional mobility. Pt currently requiring minguard and intermittent minA pushing IV pole during functional mobility within room; currently requires MinA for LB ADLs, demonstrating overall generalized weakness and decreased dynamic balance. Pt reports has available family assist at home for ADLs PRN, anticipate pt will not need follow up OT services after discharge, will benefit from continued OT services while in acute setting to maximize her overall safety and independence with ADLs and mobility prior to discharge.     Follow Up Recommendations  No OT follow up;Supervision/Assistance - 24 hour    Equipment Recommendations  None recommended by OT           Precautions / Restrictions Precautions Precautions: None Restrictions Weight Bearing Restrictions: No      Mobility Bed Mobility Overal bed mobility: Modified Independent             General bed mobility comments: increased time/effort to return to supine, though no physical assist needed  Transfers Overall transfer level: Needs assistance Equipment used: None Transfers: Sit to/from Stand Sit to Stand: Min guard         General transfer comment: MinGuard for safety, no physical assist needed     Balance Overall balance assessment: Needs assistance Sitting-balance support: Feet supported;No upper extremity supported Sitting  balance-Leahy Scale: Good Sitting balance - Comments: static sitting EOB    Standing balance support: During functional activity;Single extremity supported;No upper extremity supported Standing balance-Leahy Scale: Fair Standing balance comment: able to maintain static standing without UE support and minGuard, mildly unsteady with dynamic mobility without UE support                            ADL either performed or assessed with clinical judgement   ADL Overall ADL's : Needs assistance/impaired Eating/Feeding: Modified independent;Sitting   Grooming: Min guard;Wash/dry hands;Standing   Upper Body Bathing: Min guard;Sitting   Lower Body Bathing: Min guard;Sit to/from stand   Upper Body Dressing : Set up;Sitting   Lower Body Dressing: Min guard;Sit to/from stand Lower Body Dressing Details (indicate cue type and reason): pt easily able to reach and adjust socks while sitting EOB  Toilet Transfer: Min guard;Minimal assistance;BSC Toilet Transfer Details (indicate cue type and reason): BSC over toilet; intermittently requires [place] and Hygiene: Min guard;Sit to/from stand Toileting - Clothing Manipulation Details (indicate cue type and reason): pt performing peri-care after voiding bladder and clothing management with Minguard for safety      Functional mobility during ADLs: Min guard;Minimal assistance General ADL Comments: pt initially reaching out for single UE support during room level mobility, overall minguard though intermittently requires minA, appears more steady when pushing IV pole; HR up to 110 after return to sitting EOB following toileting and standing grooming ADLs. Pt reports on 3L O2 at baseline though can ambulate to bathroom without; O2 monitored and reading 90% on RA after ambulating to toilet  Vision Baseline Vision/History: Wears glasses Wears Glasses: Reading only Patient Visual Report: No change  from baseline Vision Assessment?: No apparent visual deficits;Yes Eye Alignment: Within Functional Limits Ocular Range of Motion: Within Functional Limits Tracking/Visual Pursuits: Able to track stimulus in all quads without difficulty Visual Fields: No apparent deficits                Pertinent Vitals/Pain Pain Assessment: No/denies pain     Hand Dominance Right   Extremity/Trunk Assessment Upper Extremity Assessment Upper Extremity Assessment: Generalized weakness(minimal decreased fine motor though appears equal throughout L/R UE, assume this is baseline )   Lower Extremity Assessment Lower Extremity Assessment: Defer to PT evaluation       Communication Communication Communication: No difficulties   Cognition Arousal/Alertness: Awake/alert Behavior During Therapy: WFL for tasks assessed/performed Overall Cognitive Status: Within Functional Limits for tasks assessed                                     General Comments  pt spouse and children present during session                Home Living Family/patient expects to be discharged to:: Private residence Living Arrangements: Spouse/significant other Available Help at Discharge: Family Type of Home: House Home Access: Stairs to enter Technical brewer of Steps: 1   Home Layout: One level     Bathroom Shower/Tub: Teacher, early years/pre: Standard     Home Equipment: Environmental consultant - 2 wheels;Cane - single point;Shower seat          Prior Functioning/Environment Level of Independence: Independent                 OT Problem List: Decreased strength;Decreased activity tolerance;Impaired balance (sitting and/or standing)      OT Treatment/Interventions: Self-care/ADL training;DME and/or AE instruction;Therapeutic activities;Balance training;Therapeutic exercise;Energy conservation;Patient/family education    OT Goals(Current goals can be found in the care plan section)  Acute Rehab OT Goals Patient Stated Goal: return home  OT Goal Formulation: With patient Time For Goal Achievement: 06/14/17 Potential to Achieve Goals: Good  OT Frequency: Min 2X/week                             AM-PAC PT "6 Clicks" Daily Activity     Outcome Measure Help from another person eating meals?: None Help from another person taking care of personal grooming?: None Help from another person toileting, which includes using toliet, bedpan, or urinal?: A Little Help from another person bathing (including washing, rinsing, drying)?: A Little Help from another person to put on and taking off regular upper body clothing?: None Help from another person to put on and taking off regular lower body clothing?: A Little 6 Click Score: 21   End of Session Equipment Utilized During Treatment: Gait belt Nurse Communication: Mobility status  Activity Tolerance: Patient tolerated treatment well Patient left: with call bell/phone within reach;in bed;with family/visitor present  OT Visit Diagnosis: Unsteadiness on feet (R26.81)                Time: 6811-5726 OT Time Calculation (min): 25 min Charges:  OT General Charges $OT Visit: 1 Visit OT Evaluation $OT Eval Moderate Complexity: 1 Mod G-Codes:     Lou Cal, OT Pager [phone] 05/31/2017   Raymondo Band 05/31/2017, 2:32 PM

## 2017-05-31 NOTE — Progress Notes (Signed)
ANTICOAGULATION CONSULT NOTE - Initial Consult  Pharmacy Consult:  Eliquis  Indication: atrial fibrillation  Allergies  Allergen Reactions  . Dabigatran Etexilate Mesylate Other (See Comments)    INTERNAL BLEEDING-pradaxa  . Talwin [Pentazocine] Other (See Comments)    hallucinations  . Lactate Rash    Patient Measurements: Height: 5' (152.4 cm) Weight: 103 lb 2.8 oz (46.8 kg) IBW/kg (Calculated) : 45.5  Vital Signs: Temp: 98 F (36.7 C) (03/15 1333) Temp Source: Oral (03/15 1333) BP: 166/79 (03/15 1333) Pulse Rate: 80 (03/15 1333)  Labs: Recent Labs    05/30/17 1754 05/31/17 0753  HGB 10.5* 10.3*  HCT 32.5* 31.7*  PLT 198 217  APTT 43*  --   LABPROT 14.4  --   INR 1.13  --   CREATININE 1.38* 0.95  TROPONINI <0.03  --     Estimated Creatinine Clearance: 38.4 mL/min (by C-G formula based on SCr of 0.95 mg/dL).   Medical History: Past Medical History:  Diagnosis Date  . Anemia   . Anginal pain ([place])   . Arthritis    right hip  . Atrial fibrillation ([place])   . Blood transfusion   . CAD (coronary artery disease)    mild disease per cath in 2011  . CHF (congestive heart failure) ([place])   . Chronic abdominal pain   . Chronic nausea   . Chronic pain syndrome   . COPD (chronic obstructive pulmonary disease) ([facility])    continuous O2- 2l  . Diverticulitis   . Elbow fracture, left aug 2012  . Elevated liver enzymes   . Fibromyalgia    COUPLE OF TIMES A YEAR-PT HAS EPISODES OF CONFUSION-USUALLY INVOLVES DAY/NIGHT REVERSAL AND EPISODES OF TWITCHING AND FALLS--SEES DR. Jacelyn Grip - NEUROLOGIST-LAST SEEN 07/10/11  . Gastroparesis   . GERD (gastroesophageal reflux disease)   . H/O: GI bleed    from Pradaxa  . Hepatitis    hx of in high school   . HTN (hypertension)    under control; has been on med. x "years"  . Hx MRSA infection   . Hx of gastric ulcer   . Hx of stress fx aug 2011   right hip   . LBBB (left bundle branch block)   . Oxygen dependent    2 liters  via nasal cannula at all times  . Pacemaker    CRT therapy; followed by Dr. Caryl Comes  . Pleural effusion     s/p right thoracentesis 03/09  . Pneumonia - 03/09, 12/09, 02/10, 06/10   MOST RECENT FEB 2013  . Primary dilated cardiomyopathy ([facility])    EF 45 to 50% per echo in Jan 2012  . RLS (restless legs syndrome)   . Silent aspiration   . Small bowel obstruction ([place])   . Urinary incontinence     -INTERSTIM IMPLANT NOT FUNCTIONING PER PT  . Venous embolism and thrombosis of subclavian vein ([place])    after pacemaker insertion Oct 2011  . Vitamin B12 deficiency       Assessment: 52 YOF with Afib not on anticoagulation due to history of GIB while on Pradaxa.  She presented with right-sided numbness, weakness and aphagia.  CT negative for acute intracranial abnormalities.  No MRI due to pacemaker.  Repeat CT still negative for bleed and Pharmacy consulted to start Eliquis.  She qualifies for full dosing given age < 13 and SCr < 1.5.  Noted patient received prophylactic Lovenox this AM.   Goal of Therapy:  Appropriate anticoagulation Monitor platelets by  anticoagulation protocol: Yes    Plan:  D/C Lovenox Eliquis 5mg  PO BID Stop ASA when Eliquis is administered per MD Pharmacy will sign off and follow peripherally.  Thank you for the consult!   Larraine Argo D. Mina Marble, PharmD, BCPS Pager:  [phone] 05/31/2017, 6:05 PM

## 2017-05-31 NOTE — Progress Notes (Signed)
Patient ID: [name], female   DOB: 1945-12-18, 72 y.o.   MRN: [id number]  PROGRESS NOTE    DEMARIS BOUSQUET  WUJ:[id number] DOB: [birth year] DOA: 05/30/2017 PCP: Ann Held, DO   Brief Narrative:  72 year old female with history of hypertension, COPD, pacemaker placement, LBBB, gastric ulcer, GI bleeding, diastolic CHF, atrial fibrillation not on anticoagulants, anemia presented with right-sided numbness, weakness and slurred speech.  Initial CT of the head was negative for acute intracranial abnormality.  Neurology was consulted.   Assessment & Plan:   Principal Problem:   TIA (transient ischemic attack) Active Problems:   Essential hypertension   Atrial fibrillation/flutter   COPD with emphysema (HCC)   Anemia   UTI (urinary tract infection)   Focal neurological deficit   Chronic diastolic CHF (congestive heart failure) (HCC)   AKI (acute kidney injury) (HCC)   Abnormal LFTs   TIA vs. Stroke:  -Symptoms have much improved.  Initial CT was negative for acute intracranial abnormality.  MRI can not be done because of pacemaker.  Neurology following.  Neurology has ordered CT angiogram of the head and neck which is pending -I spoke to Dr. Xu/neurology who thinks that patient should benefit from anticoagulation/Eliquis if cleared by GI.  I have spoken [facility] PA on call/Paula on phone and have requested for GI recommendations regarding anticoagulation.  -PT/OT/SLP evaluation  -Continue aspirin -2D echo -Start Lipitor 20 mg daily  HTN:  -hold lasix to allow permissive hypertension.  Monitor blood pressure -continue coreg which is for A fib -IV hydralazine prn for SBP>220  Atrial Fibrillation: CHA2DS2-VASc Score is 6, needs oral anticoagulation, but pt is not on anticoagulation due to hx of GI bleed. Heart rate is well controlled. -continue coreg  COPD with emphysema: stable. -continue Spirava inhaler -prn albuterol nebs  UTI: -IV rocephin -Follow  cultures  Chronic diastolic CHF (congestive heart failure): 2-D echo on 05/18/14 showed EF 60-65% with grade 1 diastolic dysfunction. Patient does not have leg edema. No respiratory distress. CHF is compensated. -Hold Lasix due to acute renal injury. -Follow echo  AKI: Likely due to prerenal secondary to dehydration and continuation of diuretics and UTI -Improved.  Lasix on hold.  Repeat a.m. Creatinine  Abnormal LFTs: mild transaminase elevation -Resolved except for mild alkaline phosphatase elevation    DVT prophylaxis: Lovenox Code Status: Full Family Communication: Husband at bedside Disposition Plan: Home once cleared by neurology  Consultants: Neurology.  Spoke to GI PA on phone/Paula  Procedures: None  Antimicrobials: Rocephin from 05/30/2017 onwards   Subjective: Patient seen and examined at bedside.  She feels better.  Her speech is improving as per the husband.  Her weakness is improving.  No overnight fever or vomiting.  Objective: Vitals:   05/31/17 0200 05/31/17 0400 05/31/17 1028 05/31/17 1333  BP: (!) 143/66 (!) 145/52 (!) 180/66 (!) 166/79  Pulse: 80 81 81 80  Resp: 18 18 20 20   Temp:  98.5 F (36.9 C) 98.9 F (37.2 C) 98 F (36.7 C)  TempSrc:  Oral Oral Oral  SpO2: 99% 99% 98% 99%  Weight:      Height:        Intake/Output Summary (Last 24 hours) at 05/31/2017 1346 Last data filed at 05/31/2017 9562 Gross per 24 hour  Intake 752.5 ml  Output 300 ml  Net 452.5 ml   Filed Weights   05/30/17 1655 05/30/17 2226  Weight: 45.2 kg (99 lb 9.6 oz) 46.8 kg (103 lb 2.8 oz)  Examination:  General exam: Appears calm and comfortable  Respiratory system: Bilateral decreased breath sound at bases Cardiovascular system: S1 & S2 heard, rate controlled  gastrointestinal system: Abdomen is nondistended, soft and nontender. Normal bowel sounds heard. Central nervous system: Alert and oriented. No focal neurological deficits. Moving extremities Extremities:  No cyanosis, clubbing, edema      Data Reviewed: I have personally reviewed following labs and imaging studies  CBC: Recent Labs  Lab 05/30/17 1754 05/31/17 0753  WBC 9.5 7.7  NEUTROABS 7.7 5.7  HGB 10.5* 10.3*  HCT 32.5* 31.7*  MCV 91.5 91.1  PLT 198 678   Basic Metabolic Panel: Recent Labs  Lab 05/30/17 1754 05/31/17 0753  NA 135 139  K 4.0 4.1  CL 96* 104  CO2 30 24  GLUCOSE 83 102*  BUN 28* 18  CREATININE 1.38* 0.95  CALCIUM 8.5* 8.2*   GFR: Estimated Creatinine Clearance: 38.4 mL/min (by C-G formula based on SCr of 0.95 mg/dL). Liver Function Tests: Recent Labs  Lab 05/30/17 1754 05/31/17 0753  AST 59* 34  ALT 64* 44  ALKPHOS 204* 165*  BILITOT 0.6 0.3  PROT 7.3 6.2*  ALBUMIN 3.7 3.1*   No results for input(s): LIPASE, AMYLASE in the last 168 hours. No results for input(s): AMMONIA in the last 168 hours. Coagulation Profile: Recent Labs  Lab 05/30/17 1754  INR 1.13   Cardiac Enzymes: Recent Labs  Lab 05/30/17 1754  TROPONINI <0.03   BNP (last 3 results) No results for input(s): PROBNP in the last 8760 hours. HbA1C: Recent Labs    05/31/17 0046  HGBA1C 5.2   CBG: Recent Labs  Lab 05/30/17 1757  GLUCAP 71   Lipid Profile: Recent Labs    05/31/17 0753  CHOL 140  HDL 43  LDLCALC 84  TRIG 67  CHOLHDL 3.3   Thyroid Function Tests: No results for input(s): TSH, T4TOTAL, FREET4, T3FREE, THYROIDAB in the last 72 hours. Anemia Panel: No results for input(s): VITAMINB12, FOLATE, FERRITIN, TIBC, IRON, RETICCTPCT in the last 72 hours. Sepsis Labs: No results for input(s): PROCALCITON, LATICACIDVEN in the last 168 hours.  No results found for this or any previous visit (from the past 240 hour(s)).       Radiology Studies: Ct Head Wo Contrast  Result Date: 05/30/2017 CLINICAL DATA:  72 y/o F; 2 days of right-sided weakness. Slurred speech and confusion. EXAM: CT HEAD WITHOUT CONTRAST TECHNIQUE: Contiguous axial images were  obtained from the base of the skull through the vertex without intravenous contrast. COMPARISON:  06/04/2011 CT head FINDINGS: Brain: No evidence of acute infarction, hemorrhage, hydrocephalus, extra-axial collection or mass lesion/mass effect. Few stable nonspecific foci of hypoattenuation are present in subcortical white matter compatible mild chronic microvascular ischemic changes. Vascular: Mild calcific atherosclerosis of carotid siphons. No hyperdense vessel identified. Skull: Normal. Negative for fracture or focal lesion. Sinuses/Orbits: Mild paranasal sinus mucosal thickening. Normal aeration of mastoid air cells. Bilateral intra-ocular lens replacement. Other: None. IMPRESSION: 1. No acute intracranial abnormality identified. 2. Stable mild chronic microvascular ischemic changes. 3. Mild paranasal sinus disease. Electronically Signed   By: Kristine Garbe M.D.   On: 05/30/2017 18:31        Scheduled Meds: . aspirin EC  81 mg Oral Daily  . carvedilol  25 mg Oral BID WC  . enoxaparin (LOVENOX) injection  30 mg Subcutaneous Daily  . folic acid  1 mg Oral Daily  . gabapentin  600 mg Oral Q6H  . pantoprazole  40  mg Oral BID  . tiotropium  18 mcg Inhalation Daily   Continuous Infusions: . sodium chloride 75 mL/hr at 05/31/17 0007  . cefTRIAXone (ROCEPHIN)  IV       LOS: 0 days        Aline August, MD [facility] Pager [phone]  If 7PM-7AM, please contact night-coverage [email or web address] Password [email or web address] 05/31/2017, 1:46 PM

## 2017-05-31 NOTE — Progress Notes (Addendum)
STROKE TEAM PROGRESS NOTE   SUBJECTIVE (INTERVAL HISTORY) Her husband is at the bedside.  They recount her GIB occurred when she was on pradaxa and "very sick" that they ignored then what they now know to be warning signs (tarry stools, weak, etc). They are both open to considering anticoagulation again as they are educated on what to side effects to look for. They would prefer a different AC, not pradaxa.   OBJECTIVE Vitals:   05/31/17 0200 05/31/17 0400 05/31/17 1028 05/31/17 1333  BP: (!) 143/66 (!) 145/52 (!) 180/66 (!) 166/79  Pulse: 80 81 81 80  Resp: 18 18 20 20   Temp:  98.5 F (36.9 C) 98.9 F (37.2 C) 98 F (36.7 C)  TempSrc:  Oral Oral Oral  SpO2: 99% 99% 98% 99%  Weight:      Height:        CBC:  Recent Labs  Lab 05/30/17 1754 05/31/17 0753  WBC 9.5 7.7  NEUTROABS 7.7 5.7  HGB 10.5* 10.3*  HCT 32.5* 31.7*  MCV 91.5 91.1  PLT 198 256    Basic Metabolic Panel:  Recent Labs  Lab 05/30/17 1754 05/31/17 0753  NA 135 139  K 4.0 4.1  CL 96* 104  CO2 30 24  GLUCOSE 83 102*  BUN 28* 18  CREATININE 1.38* 0.95  CALCIUM 8.5* 8.2*    Lipid Panel:     Component Value Date/Time   CHOL 140 05/31/2017 0753   TRIG 67 05/31/2017 0753   TRIG 76 03/11/2006 1059   HDL 43 05/31/2017 0753   CHOLHDL 3.3 05/31/2017 0753   VLDL 13 05/31/2017 0753   LDLCALC 84 05/31/2017 0753   HgbA1c:  Lab Results  Component Value Date   HGBA1C 5.2 05/31/2017   Urine Drug Screen:     Component Value Date/Time   LABOPIA POSITIVE (A) 05/30/2017 1940   COCAINSCRNUR NONE DETECTED 05/30/2017 1940   LABBENZ NONE DETECTED 05/30/2017 1940   AMPHETMU NONE DETECTED 05/30/2017 1940   THCU NONE DETECTED 05/30/2017 1940   LABBARB NONE DETECTED 05/30/2017 1940    Alcohol Level     Component Value Date/Time   ETH <10 05/30/2017 1754    IMAGING  Ct Head Wo Contrast  Result Date: 05/30/2017 CLINICAL DATA:  72 y/o F; 2 days of right-sided weakness. Slurred speech and confusion.  EXAM: CT HEAD WITHOUT CONTRAST TECHNIQUE: Contiguous axial images were obtained from the base of the skull through the vertex without intravenous contrast. COMPARISON:  06/04/2011 CT head FINDINGS: Brain: No evidence of acute infarction, hemorrhage, hydrocephalus, extra-axial collection or mass lesion/mass effect. Few stable nonspecific foci of hypoattenuation are present in subcortical white matter compatible mild chronic microvascular ischemic changes. Vascular: Mild calcific atherosclerosis of carotid siphons. No hyperdense vessel identified. Skull: Normal. Negative for fracture or focal lesion. Sinuses/Orbits: Mild paranasal sinus mucosal thickening. Normal aeration of mastoid air cells. Bilateral intra-ocular lens replacement. Other: None. IMPRESSION: 1. No acute intracranial abnormality identified. 2. Stable mild chronic microvascular ischemic changes. 3. Mild paranasal sinus disease. Electronically Signed   By: Kristine Garbe M.D.   On: 05/30/2017 18:31    PHYSICAL EXAM Patient alert and oriented x 3. Speech clear. No aphasia. No dysarthria. Extraoccular movements intact. Visual fields full. Face symmetric. Tongue midline. Moves all extremities x 4. Strength normal. Coordination normal. Sensation intact. Heart rate regular. Breath sounds clear.    ASSESSMENT/PLAN [name] is a 72 y.o. female with history of AF not on AC, CAD, CHF, COPD,  pacemaker (2011) presenting with slurred speech and R arm weakness/numbness.    Possible Stroke  Resultant  Neuro deficits resolved  CT head no acute abnormality  MRI head / MRA head pacer  CTA H&N pending   2D Echo  pending   LDL 84  HgbA1c 5.2  Lovenox 30 mg sq daily for VTE prophylaxis  Fall precautions  Diet Heart Room service appropriate? Yes; Fluid consistency: Thin  No antithrombotic prior to admission, now on aspirin 81 mg daily. Likely recommend AC at discharge. Await CTA prior to deciding  Patient counseled about  her antithrombotic medications  Ongoing aggressive stroke risk factor management  Therapy recommendations:  No therapy needs  Disposition:  Return home w/ husband  Atrial Fibrillation  Home anticoagulation:  No antithrombotic d/t hx of GIB on pradaxa requiring transfusion while under the care of Dr. Doreatha Lew  Patient and husband understand likely recommendation for [facility] at time of discharge for d/t stroke risk. They are agreeable.  Avoid Pradaxa  Await CTA before deciding on AC   Hypertension  Stable BP goal normotensive  Hyperlipidemia  Home meds:  No statin  mildly elevated LFTs   Now on lipitor 20 by attending team  LDL 84, goal < 70  Continue statin at discharge  Other Stroke Risk Factors  Advanced age  Former Cigarette smoker  UDS positive for opiates / ETOH level not performed   Family hx stroke (mother)  Primary dilated CM, chronic diastolic CHF w/ pacer Caryl Comes).   Other Active Problems  COPD w/ emphysema, home O2  AKI, Cr 1.38->0.95  abnormal LFTs  Hospital day # [place] for Pager information 05/31/2017 3:12 PM   ATTENDING NOTE: I reviewed above note and agree with the assessment and plan. I have made any additions or clarifications directly to the above note. Pt was seen and examined.   72 year old female with history of atrial fibrillation, CAD, CHF, COPD, LBBB status post pacemaker 2011, subclavian vein DVT post pacemaker placement, and gastric ulcer with history of GI bleeding requiring blood transfusion admitted for episode of slurred speech, right arm leg weakness and numbness.  She has extensive GI history, following with Dr. Carlean Purl for GI bleeding, small bowel obstruction.  Last seen 04/02/2017.  She had GI bleeding after taking Pradaxa for A. Fib and required blood transfusion at the time.  On this admission, CT showed no acute abnormality.  Repeat CT second day no acute abnormality.  CTA head  and neck showed essentially unremarkable except left VA C3 chronic occlusion.  2D echo pending.  A1c 5.2 and LDL 84.  She also found to have UTI and currently on Rocephin.  On Lipitor 20 for hyperlipidemia and stroke prevention.  At this time, patient H&H stable, no active bleeding, Dr. Starla Link discussed with Dr. Carlean Purl about future anticoagulation. Dr. Carlean Purl is OK with eliquis, which has least GI bleeding risk among DOACs. Will start pt on eliquis 5mg  bid.   Neurology will sign off. Please call with questions. Pt will follow up with stroke clinic NP at [facility] in about 4 weeks. Thanks for the consult.    Rosalin Hawking, MD PhD Stroke Neurology 05/31/2017 5:37 PM   To contact Stroke Continuity provider, please refer to [email or web address] After hours, contact General Neurology

## 2017-06-01 ENCOUNTER — Inpatient Hospital Stay (HOSPITAL_COMMUNITY): Payer: PPO

## 2017-06-01 LAB — HEPATITIS PANEL, ACUTE
HCV Ab: <0.1 s/co ratio (ref 0.0–0.9)
Hep A IgM: NEGATIVE
Hep B C IgM: NEGATIVE
Hepatitis B Surface Ag: NEGATIVE

## 2017-06-01 LAB — CBC WITH DIFFERENTIAL/PLATELET
Basophils Absolute: 0 10*3/uL (ref 0.0–0.1)
Basophils Relative: 1 %
Eosinophils Absolute: 0.1 10*3/uL (ref 0.0–0.7)
Eosinophils Relative: 2 %
HCT: 31.3 % — ABNORMAL LOW (ref 36.0–46.0)
Hemoglobin: 9.9 g/dL — ABNORMAL LOW (ref 12.0–15.0)
Lymphocytes Relative: 7 %
Lymphs Abs: 0.5 10*3/uL — ABNORMAL LOW (ref 0.7–4.0)
MCH: 28.5 pg (ref 26.0–34.0)
MCHC: 31.6 g/dL (ref 30.0–36.0)
MCV: 90.2 fL (ref 78.0–100.0)
Monocytes Absolute: 1 10*3/uL (ref 0.1–1.0)
Monocytes Relative: 14 %
Neutro Abs: 5.1 10*3/uL (ref 1.7–7.7)
Neutrophils Relative %: 76 %
Platelets: 195 10*3/uL (ref 150–400)
RBC: 3.47 MIL/uL — ABNORMAL LOW (ref 3.87–5.11)
RDW: 14.2 % (ref 11.5–15.5)
WBC: 6.7 10*3/uL (ref 4.0–10.5)

## 2017-06-01 LAB — COMPREHENSIVE METABOLIC PANEL
ALT: 29 U/L (ref 14–54)
AST: 28 U/L (ref 15–41)
Albumin: 2.9 g/dL — ABNORMAL LOW (ref 3.5–5.0)
Alkaline Phosphatase: 137 U/L — ABNORMAL HIGH (ref 38–126)
Anion gap: 9 (ref 5–15)
BUN: 9 mg/dL (ref 6–20)
CO2: 23 mmol/L (ref 22–32)
Calcium: 8.3 mg/dL — ABNORMAL LOW (ref 8.9–10.3)
Chloride: 106 mmol/L (ref 101–111)
Creatinine, Ser: 0.83 mg/dL (ref 0.44–1.00)
GFR calc Af Amer: >60 mL/min (ref >60)
GFR calc non Af Amer: >60 mL/min (ref >60)
Glucose, Bld: 87 mg/dL (ref 65–99)
Potassium: 4.5 mmol/L (ref 3.5–5.1)
Sodium: 138 mmol/L (ref 135–145)
Total Bilirubin: 0.9 mg/dL (ref 0.3–1.2)
Total Protein: 6 g/dL — ABNORMAL LOW (ref 6.5–8.1)

## 2017-06-01 LAB — ECHOCARDIOGRAM COMPLETE
Height: 60 in
Weight: 1650.8 oz

## 2017-06-01 LAB — URINE CULTURE: Culture: <10000 — AB

## 2017-06-01 LAB — UREA NITROGEN, URINE: Urea Nitrogen, Ur: 368 mg/dL

## 2017-06-01 LAB — MAGNESIUM: Magnesium: 1.9 mg/dL (ref 1.7–2.4)

## 2017-06-01 MED ORDER — CEPHALEXIN 500 MG PO CAPS: 500.0000 mg | ORAL_CAPSULE | Freq: Two times a day (BID) | ORAL | 0 refills | Status: AC

## 2017-06-01 MED ORDER — PANTOPRAZOLE SODIUM 40 MG PO TBEC: 40.0000 mg | DELAYED_RELEASE_TABLET | Freq: Two times a day (BID) | ORAL | 0 refills | Status: DC

## 2017-06-01 MED ORDER — WHITE PETROLATUM EX OINT
TOPICAL_OINTMENT | CUTANEOUS | Status: AC
  Administered 2017-06-01: 0.2
  Filled 2017-06-01: qty 28.35

## 2017-06-01 MED ORDER — ATORVASTATIN CALCIUM 20 MG PO TABS: 20.0000 mg | ORAL_TABLET | Freq: Every day | ORAL | 0 refills | Status: DC

## 2017-06-01 MED ORDER — APIXABAN 5 MG PO TABS: 5.0000 mg | ORAL_TABLET | Freq: Two times a day (BID) | ORAL | 0 refills | Status: DC

## 2017-06-01 MED ORDER — GABAPENTIN 600 MG PO TABS: 600.0000 mg | ORAL_TABLET | Freq: Three times a day (TID) | ORAL | Status: DC

## 2017-06-01 NOTE — Progress Notes (Signed)
Echocardiogram 2D Echocardiogram has been performed.  [name] 06/01/2017, 11:00 AM

## 2017-06-01 NOTE — Care Management (Signed)
Contacted pt's pharmacy where scripts were sent.  Pharmacy states they do not have pt's insurance information and are not able to tell me copay.  Pt given 30 day free and copay cards.  Pt educated about possibility of physician samples and pt assistance program.  Pt warned not to abruptly stop medication.

## 2017-06-01 NOTE — Discharge Summary (Signed)
Physician Discharge Summary  SAADIYA WILFONG JKD:[id number] DOB: 13-Feb-1946 DOA: 05/30/2017  PCP: Ann Held, DO  Admit date: 05/30/2017 Discharge date: 06/01/2017  Admitted From: Home Disposition: Home  Recommendations for Outpatient Follow-up:  1. Follow up with PCP in 1week with repeat CBC/BMP 2. Follow-up with neurology as an outpatient 3. Follow-up with gastroenterology as an outpatient   Home Health: No Equipment/Devices: None  Discharge Condition: Stable CODE STATUS: Full Diet recommendation: Heart Healthy /diet as per SLP recommendations  Brief/Interim Summary: 72 year old female with history of hypertension, COPD, pacemaker placement, LBBB, gastric ulcer, GI bleeding, diastolic CHF, atrial fibrillation not on anticoagulants, anemia presented with right-sided numbness, weakness and slurred speech.  Initial CT of the head was negative for acute intracranial abnormality.  Neurology was consulted.  Her symptoms have resolved.  She is tolerating diet and physical therapy.  MRI could not be done because of pacemaker.  Repeat CT of the head did not show any acute abnormality.  Patient was started on Eliquis as per neurology recommendations after getting GI clearance on phone.  Neurology has cleared the patient for discharge.  Patient will be discharged home today after echocardiogram is completed.    Discharge Diagnoses:  Principal Problem:   TIA (transient ischemic attack) Active Problems:   Essential hypertension   Atrial fibrillation/flutter   COPD with emphysema (HCC)   Anemia   UTI (urinary tract infection)   Focal neurological deficit   Chronic diastolic CHF (congestive heart failure) (HCC)   AKI (acute kidney injury) (HCC)   Abnormal LFTs  Probable TIA: -Symptoms of right-sided numbness, weakness and slurred speech have resolved..  Initial CT was negative for acute intracranial abnormality.  MRI could not be done because of pacemaker.  Neurology following.   Repeat CT of the head was negative for acute intracranial abnormality.  -Patient was started on Eliquis as per neurology recommendations after getting GI clearance on phone.  Neurology has cleared the patient for discharge.  Patient will be discharged home today after echocardiogram is completed.  Continue Eliquis and Lipitor   HTN:  -Continue home antihypertensives.  Outpatient follow-up  Atrial Fibrillation: CHA2DS2-VASc Scoreis 6 -Has been started on Eliquis -Continue Coreg  COPD with emphysema:stable. -Outpatient follow-up  UTI: -Currently on IV rocephin.  Cultures negative.  Discharge on Keflex for 2 more days  Chronic diastolic CHF (congestive heart failure):2-D echo on 05/18/14 showed EF 60-65% with grade 1 diastolic dysfunction. Patient does not have leg edema. No respiratory distress. CHF is compensated. -Continue Lasix.  Echo pending.  Outpatient follow-up  KDX:IPJASN due to prerenal secondary to dehydration and continuation of diuretics and UTI -Improved.  Resume home Lasix  Abnormal LFTs: mildtransaminase elevation -Resolved except for mild alkaline phosphatase elevation    Discharge Instructions  Discharge Instructions    Ambulatory referral to Gastroenterology   Complete by:  As directed    History of GI bleed; now on anticoagulation   What is the reason for referral?:  Other   Ambulatory referral to Neurology   Complete by:  As directed    Follow up with stroke clinic NP (Jessica Vanschaick or Cecille Rubin, if both not available, consider Zachery Dauer, or Ahern) at [facility] in about 4 weeks. Thanks.   Ambulatory referral to Neurology   Complete by:  As directed    An appointment is requested in approximately: few weeks   Call MD for:  difficulty breathing, headache or visual disturbances   Complete by:  As directed  Call MD for:  extreme fatigue   Complete by:  As directed    Call MD for:  hives   Complete by:  As directed    Call MD for:   persistant dizziness or light-headedness   Complete by:  As directed    Call MD for:  persistant nausea and vomiting   Complete by:  As directed    Call MD for:  severe uncontrolled pain   Complete by:  As directed    Call MD for:  temperature >100.4   Complete by:  As directed    Diet - low sodium heart healthy   Complete by:  As directed    Increase activity slowly   Complete by:  As directed      Allergies as of 06/01/2017      Reactions   Dabigatran Etexilate Mesylate Other (See Comments)   INTERNAL BLEEDING-pradaxa   Talwin [pentazocine] Other (See Comments)   hallucinations   Lactate Rash      Medication List    TAKE these medications   apixaban 5 MG Tabs tablet Commonly known as:  ELIQUIS Take 1 tablet (5 mg total) by mouth 2 (two) times daily.   atorvastatin 20 MG tablet Commonly known as:  LIPITOR Take 1 tablet (20 mg total) by mouth daily at 6 PM.   carvedilol 25 MG tablet Commonly known as:  COREG TAKE 1 TABLET BY MOUTH TWICE DAILY What changed:    how much to take  how to take this  when to take this   cephALEXin 500 MG capsule Commonly known as:  KEFLEX Take 1 capsule (500 mg total) by mouth 2 (two) times daily for 3 days.   folic acid 1 MG tablet Commonly known as:  FOLVITE Take 1 mg by mouth daily.   furosemide 40 MG tablet Commonly known as:  LASIX TAKE ONE TABLET BY MOUTH IN THE MORNING What changed:    how much to take  how to take this  when to take this   gabapentin 600 MG tablet Commonly known as:  NEURONTIN Take 1 tablet (600 mg total) by mouth 3 (three) times daily. What changed:    how much to take  how to take this  when to take this  additional instructions   nitroGLYCERIN 0.4 MG SL tablet Commonly known as:  NITROSTAT Place 1 tablet (0.4 mg total) under the tongue every 5 (five) minutes as needed. CHEST PAIN   oxyCODONE-acetaminophen 10-325 MG tablet Commonly known as:  PERCOCET Take 1 tablet by mouth every 4  (four) hours as needed.   pantoprazole 40 MG tablet Commonly known as:  PROTONIX Take 1 tablet (40 mg total) by mouth 2 (two) times daily.   promethazine 25 MG tablet Commonly known as:  PHENERGAN Take 1 tablet (25 mg total) by mouth every 8 (eight) hours as needed. for nausea   SPIRIVA RESPIMAT 1.25 MCG/ACT Aers Generic drug:  Tiotropium Bromide Monohydrate Inhale 2 puffs into the lungs daily.   tiZANidine 4 MG tablet Commonly known as:  ZANAFLEX Take 1 tablet (4 mg total) by mouth 3 (three) times daily as needed. for muscle spams   zolpidem 5 MG tablet Commonly known as:  AMBIEN TAKE 1 TABLET BY MOUTH ONCE DAILY AT BEDTIME AS NEEDED FOR  SLEEP What changed:    how much to take  how to take this  when to take this  reasons to take this  additional instructions  Follow-up Information    [facility]. Schedule an appointment as soon as possible for a visit in 4 week(s).   Specialty:  Neurology Contact information: [address] [place], Alferd Apa, DO. Schedule an appointment as soon as possible for a visit in 1 week(s).   Specialty:  Family Medicine Why:  with repeat CBC/BMP Contact information: [address] [place] [address] [phone]        Gatha Mayer, MD. Schedule an appointment as soon as possible for a visit in 1 week(s).   Specialty:  Gastroenterology Contact information: [address] [address] [phone]          Allergies  Allergen Reactions  . Dabigatran Etexilate Mesylate Other (See Comments)    INTERNAL BLEEDING-pradaxa  . Talwin [Pentazocine] Other (See Comments)    hallucinations  . Lactate Rash    Consultations:  Neurology.  Spoke to GI PA on phone who spoke to Dr. Carlean Purl   Procedures/Studies: Ct Angio Head W Or Wo Contrast  Result Date: 05/31/2017 CLINICAL DATA:  72 y/o F; TIA  workup, patient presented with slurred speech and right arm weakness/numbness. EXAM: CT ANGIOGRAPHY HEAD AND NECK TECHNIQUE: Multidetector CT imaging of the head and neck was performed using the standard protocol during bolus administration of intravenous contrast. Multiplanar CT image reconstructions and MIPs were obtained to evaluate the vascular anatomy. Carotid stenosis measurements (when applicable) are obtained utilizing NASCET criteria, using the distal internal carotid diameter as the denominator. CONTRAST:  63mL ISOVUE-370 IOPAMIDOL (ISOVUE-370) INJECTION 76% COMPARISON:  05/30/2017 CT head. FINDINGS: CT HEAD FINDINGS Brain: No acute stroke, hemorrhage, or focal mass effect. No hydrocephalus, extra-axial collection, or effacement of basilar cisterns. Stable chronic microvascular ischemic changes and parenchymal volume loss of the brain. No abnormal enhancement. 9 x 5 x 7 mm left frontal calcified dural nodule (series 9, image 134 and series 5, image 41), dural plaque or meningioma. Skull: No acute abnormality. Sinus and orbits: Mild diffuse paranasal sinus mucosal thickening. Normal aeration of mastoid air cells. Orbits are unremarkable. Other: Negative. CTA NECK FINDINGS Aortic arch: Standard branching. Imaged portion shows no evidence of aneurysm or dissection. No significant stenosis of the major arch vessel origins. Mild calcific atherosclerosis. Right carotid system: No evidence of dissection, stenosis (50% or greater) or occlusion. Mild non stenotic calcific atherosclerosis of right carotid bifurcation. Left carotid system: No evidence of dissection, stenosis (50% or greater) or occlusion. Mild non stenotic calcific atherosclerosis of left carotid bifurcation. Vertebral arteries: Widely patent right vertebral artery. Occluded left V1 and V2 segments to the C3 level. Downstream left vertebral artery to the basilar junction is patent. Skeleton: Moderate cervical spondylosis with disc and facet  degenerative changes greatest at the C4-C6 levels. No high-grade bony canal stenosis. Other neck: Mildly enlarged right upper paratracheal lymph nodes, likely reactive. Upper chest: Numerous tiny tree-in-bud nodules enlarged clustered nodules in bronchovascular distribution, greatest on the right lung apex, compatible with chronic bronchiolitis. Review of the MIP images confirms the above findings CTA HEAD FINDINGS Anterior circulation: No significant stenosis, proximal occlusion, aneurysm, or vascular malformation. Posterior circulation: No significant stenosis, proximal occlusion, aneurysm, or vascular malformation. Venous sinuses: As permitted by contrast timing, patent. Anatomic variants: Bilateral fetal PCA. Delayed phase: No abnormal enhancement. Review of the MIP images confirms the above findings IMPRESSION: CT head: 1. No acute intracranial abnormality identified. 2. Stable chronic microvascular  ischemic changes and parenchymal volume loss of the brain. 3. Subcentimeter left frontal dural calcification may be a dural plaque or tiny meningioma. CTA neck: 1. Occluded left vertebral artery from origin to C3 level, age indeterminate. Patent diminutive downstream left vertebral artery to the vertebrobasilar junction. 2. Otherwise patent bilateral carotid systems and dominant right vertebral artery. No dissection, aneurysm, or significant stenosis by NASCET criteria is identified. 3. Moderate spondylosis of the cervical spine. 4. 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 CTA head: Patent anterior and posterior intracranial circulation. No large vessel occlusion, aneurysm, or significant stenosis. Electronically Signed   By: Kristine Garbe M.D.   On: 05/31/2017 15:49   Ct Head Wo Contrast  Result Date: 05/30/2017 CLINICAL DATA:  72 y/o F; 2 days of right-sided weakness. Slurred speech and confusion. EXAM: CT HEAD WITHOUT CONTRAST TECHNIQUE: Contiguous  axial images were obtained from the base of the skull through the vertex without intravenous contrast. COMPARISON:  06/04/2011 CT head FINDINGS: Brain: No evidence of acute infarction, hemorrhage, hydrocephalus, extra-axial collection or mass lesion/mass effect. Few stable nonspecific foci of hypoattenuation are present in subcortical white matter compatible mild chronic microvascular ischemic changes. Vascular: Mild calcific atherosclerosis of carotid siphons. No hyperdense vessel identified. Skull: Normal. Negative for fracture or focal lesion. Sinuses/Orbits: Mild paranasal sinus mucosal thickening. Normal aeration of mastoid air cells. Bilateral intra-ocular lens replacement. Other: None. IMPRESSION: 1. No acute intracranial abnormality identified. 2. Stable mild chronic microvascular ischemic changes. 3. Mild paranasal sinus disease. Electronically Signed   By: Kristine Garbe M.D.   On: 05/30/2017 18:31   Ct Angio Neck W Or Wo Contrast  Result Date: 05/31/2017 CLINICAL DATA:  72 y/o F; TIA workup, patient presented with slurred speech and right arm weakness/numbness. EXAM: CT ANGIOGRAPHY HEAD AND NECK TECHNIQUE: Multidetector CT imaging of the head and neck was performed using the standard protocol during bolus administration of intravenous contrast. Multiplanar CT image reconstructions and MIPs were obtained to evaluate the vascular anatomy. Carotid stenosis measurements (when applicable) are obtained utilizing NASCET criteria, using the distal internal carotid diameter as the denominator. CONTRAST:  38mL ISOVUE-370 IOPAMIDOL (ISOVUE-370) INJECTION 76% COMPARISON:  05/30/2017 CT head. FINDINGS: CT HEAD FINDINGS Brain: No acute stroke, hemorrhage, or focal mass effect. No hydrocephalus, extra-axial collection, or effacement of basilar cisterns. Stable chronic microvascular ischemic changes and parenchymal volume loss of the brain. No abnormal enhancement. 9 x 5 x 7 mm left frontal calcified dural  nodule (series 9, image 134 and series 5, image 41), dural plaque or meningioma. Skull: No acute abnormality. Sinus and orbits: Mild diffuse paranasal sinus mucosal thickening. Normal aeration of mastoid air cells. Orbits are unremarkable. Other: Negative. CTA NECK FINDINGS Aortic arch: Standard branching. Imaged portion shows no evidence of aneurysm or dissection. No significant stenosis of the major arch vessel origins. Mild calcific atherosclerosis. Right carotid system: No evidence of dissection, stenosis (50% or greater) or occlusion. Mild non stenotic calcific atherosclerosis of right carotid bifurcation. Left carotid system: No evidence of dissection, stenosis (50% or greater) or occlusion. Mild non stenotic calcific atherosclerosis of left carotid bifurcation. Vertebral arteries: Widely patent right vertebral artery. Occluded left V1 and V2 segments to the C3 level. Downstream left vertebral artery to the basilar junction is patent. Skeleton: Moderate cervical spondylosis with disc and facet degenerative changes greatest at the C4-C6 levels. No high-grade bony canal stenosis. Other neck: Mildly enlarged right upper paratracheal lymph nodes, likely reactive. Upper chest: Numerous tiny tree-in-bud nodules enlarged clustered  nodules in bronchovascular distribution, greatest on the right lung apex, compatible with chronic bronchiolitis. Review of the MIP images confirms the above findings CTA HEAD FINDINGS Anterior circulation: No significant stenosis, proximal occlusion, aneurysm, or vascular malformation. Posterior circulation: No significant stenosis, proximal occlusion, aneurysm, or vascular malformation. Venous sinuses: As permitted by contrast timing, patent. Anatomic variants: Bilateral fetal PCA. Delayed phase: No abnormal enhancement. Review of the MIP images confirms the above findings IMPRESSION: CT head: 1. No acute intracranial abnormality identified. 2. Stable chronic microvascular ischemic changes  and parenchymal volume loss of the brain. 3. Subcentimeter left frontal dural calcification may be a dural plaque or tiny meningioma. CTA neck: 1. Occluded left vertebral artery from origin to C3 level, age indeterminate. Patent diminutive downstream left vertebral artery to the vertebrobasilar junction. 2. Otherwise patent bilateral carotid systems and dominant right vertebral artery. No dissection, aneurysm, or significant stenosis by NASCET criteria is identified. 3. Moderate spondylosis of the cervical spine. 4. 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 CTA head: Patent anterior and posterior intracranial circulation. No large vessel occlusion, aneurysm, or significant stenosis. Electronically Signed   By: Kristine Garbe M.D.   On: 05/31/2017 15:49    Echo pending   Subjective: Patient seen and examined at bedside.  She denies any overnight fever, nausea, vomiting, extremity weakness.  She wants to go home  Discharge Exam: Vitals:   06/01/17 0045 06/01/17 0441  BP: (!) 169/75 (!) 175/73  Pulse: 72 71  Resp: 20 18  Temp: 98.1 F (36.7 C) 98.6 F (37 C)  SpO2: 100% 99%   Vitals:   05/31/17 1829 05/31/17 2024 06/01/17 0045 06/01/17 0441  BP: (!) 170/72 (!) 175/66 (!) 169/75 (!) 175/73  Pulse: 79 73 72 71  Resp: 20 20 20 18   Temp: 98.5 F (36.9 C) 98 F (36.7 C) 98.1 F (36.7 C) 98.6 F (37 C)  TempSrc: Oral Oral Oral Oral  SpO2: 98% 99% 100% 99%  Weight:      Height:        General: Pt is alert, awake, not in acute distress Cardiovascular: Rate controlled, S1/S2 + Respiratory: Bilateral decreased breath sounds at bases Abdominal: Soft, NT, ND, bowel sounds + Extremities: no edema, no cyanosis    The results of significant diagnostics from this hospitalization (including imaging, microbiology, ancillary and laboratory) are listed below for reference.     Microbiology: Recent Results (from the past 240 hour(s))   Culture, blood (Routine X 2) w Reflex to ID Panel     Status: None (Preliminary result)   Collection Time: 05/31/17 12:27 AM  Result Value Ref Range Status   Specimen Description BLOOD LEFT ANTECUBITAL  Final   Special Requests   Final    BOTTLES DRAWN AEROBIC ONLY Blood Culture results may not be optimal due to an inadequate volume of blood received in culture bottles Performed at [facility] [address], [place], [place] [address]    Culture PENDING  Incomplete   Report Status PENDING  Incomplete  Culture, blood (Routine X 2) w Reflex to ID Panel     Status: None (Preliminary result)   Collection Time: 05/31/17 12:45 AM  Result Value Ref Range Status   Specimen Description BLOOD LEFT WRIST  Final   Special Requests   Final    BOTTLES DRAWN AEROBIC ONLY Blood Culture results may not be optimal due to an inadequate volume of blood received in culture bottles Performed at [facility], [address] [place], [place] [address]    Culture PENDING  Incomplete   Report Status PENDING  Incomplete     Labs: BNP (last 3 results) Recent Labs    05/31/17 0046  BNP 287.8*   Basic Metabolic Panel: Recent Labs  Lab 05/30/17 1754 05/31/17 0753 06/01/17 0405  NA 135 139 138  K 4.0 4.1 4.5  CL 96* 104 106  CO2 30 24 23   GLUCOSE 83 102* 87  BUN 28* 18 9  CREATININE 1.38* 0.95 0.83  CALCIUM 8.5* 8.2* 8.3*  MG  --   --  1.9   Liver Function Tests: Recent Labs  Lab 05/30/17 1754 05/31/17 0753 06/01/17 0405  AST 59* 34 28  ALT 64* 44 29  ALKPHOS 204* 165* 137*  BILITOT 0.6 0.3 0.9  PROT 7.3 6.2* 6.0*  ALBUMIN 3.7 3.1* 2.9*   No results for input(s): LIPASE, AMYLASE in the last 168 hours. No results for input(s): AMMONIA in the last 168 hours. CBC: Recent Labs  Lab 05/30/17 1754 05/31/17 0753 06/01/17 0405  WBC 9.5 7.7 6.7  NEUTROABS 7.7 5.7 5.1  HGB 10.5* 10.3* 9.9*  HCT 32.5* 31.7* 31.3*  MCV 91.5 91.1 90.2  PLT 198 217 195   Cardiac  Enzymes: Recent Labs  Lab 05/30/17 1754  TROPONINI <0.03   BNP: Invalid input(s): POCBNP CBG: Recent Labs  Lab 05/30/17 1757  GLUCAP 71   D-Dimer No results for input(s): DDIMER in the last 72 hours. Hgb A1c Recent Labs    05/31/17 0046  HGBA1C 5.2   Lipid Profile Recent Labs    05/31/17 0753  CHOL 140  HDL 43  LDLCALC 84  TRIG 67  CHOLHDL 3.3   Thyroid function studies No results for input(s): TSH, T4TOTAL, T3FREE, THYROIDAB in the last 72 hours.  Invalid input(s): FREET3 Anemia work up No results for input(s): VITAMINB12, FOLATE, FERRITIN, TIBC, IRON, RETICCTPCT in the last 72 hours. Urinalysis    Component Value Date/Time   COLORURINE YELLOW 05/30/2017 1940   APPEARANCEUR CLOUDY (A) 05/30/2017 1940   LABSPEC <1.005 (L) 05/30/2017 1940   PHURINE 6.0 05/30/2017 1940   GLUCOSEU NEGATIVE 05/30/2017 1940   GLUCOSEU NEGATIVE 07/24/2011 0908   HGBUR TRACE (A) 05/30/2017 1940   HGBUR negative 05/19/2010 1609   BILIRUBINUR NEGATIVE 05/30/2017 1940   BILIRUBINUR neg 03/07/2017 1224   KETONESUR NEGATIVE 05/30/2017 1940   PROTEINUR NEGATIVE 05/30/2017 1940   UROBILINOGEN 0.2 03/07/2017 1224   UROBILINOGEN 0.2 05/17/2014 1217   NITRITE POSITIVE (A) 05/30/2017 1940   LEUKOCYTESUR MODERATE (A) 05/30/2017 1940   Sepsis Labs Invalid input(s): PROCALCITONIN,  WBC,  LACTICIDVEN Microbiology Recent Results (from the past 240 hour(s))  Culture, blood (Routine X 2) w Reflex to ID Panel     Status: None (Preliminary result)   Collection Time: 05/31/17 12:27 AM  Result Value Ref Range Status   Specimen Description BLOOD LEFT ANTECUBITAL  Final   Special Requests   Final    BOTTLES DRAWN AEROBIC ONLY Blood Culture results may not be optimal due to an inadequate volume of blood received in culture bottles Performed at [facility] [address], [place],  [address]    Culture PENDING  Incomplete   Report Status PENDING  Incomplete  Culture, blood  (Routine X 2) w Reflex to ID Panel     Status: None (Preliminary result)   Collection Time: 05/31/17 12:45 AM  Result Value Ref Range Status   Specimen Description BLOOD  LEFT WRIST  Final   Special Requests   Final    BOTTLES DRAWN AEROBIC ONLY Blood Culture results may not be optimal due to an inadequate volume of blood received in culture bottles Performed at [facility] [address] [place], [place] [address]    Culture PENDING  Incomplete   Report Status PENDING  Incomplete     Time coordinating discharge: 35 minutes  SIGNED:   Aline August, MD  [facility] 06/01/2017, 9:05 AM Pager: [phone]  If 7PM-7AM, please contact night-coverage [email or web address] Password [email or web address]

## 2017-06-01 NOTE — Plan of Care (Signed)
  Adequate for Discharge Education: Knowledge of General Education information will improve 06/01/2017 1340 - Adequate for Discharge by Ladarrius Bogdanski, Vertell Limber, RN Health Behavior/Discharge Planning: Ability to manage health-related needs will improve 06/01/2017 1340 - Adequate for Discharge by Stewart Pimenta, Vertell Limber, RN Clinical Measurements: Ability to maintain clinical measurements within normal limits will improve 06/01/2017 1340 - Adequate for Discharge by Patsie Mccardle, Vertell Limber, RN Will remain free from infection 06/01/2017 1340 - Adequate for Discharge by Evalee Jefferson, RN Diagnostic test results will improve 06/01/2017 1340 - Adequate for Discharge by Rayhana Slider, Vertell Limber, RN Respiratory complications will improve 06/01/2017 1340 - Adequate for Discharge by Evalee Jefferson, RN Cardiovascular complication will be avoided 06/01/2017 1340 - Adequate for Discharge by Evalee Jefferson, RN Activity: Risk for activity intolerance will decrease 06/01/2017 1340 - Adequate for Discharge by Evalee Jefferson, RN Nutrition: Adequate nutrition will be maintained 06/01/2017 1340 - Adequate for Discharge by Evalee Jefferson, RN Coping: Level of anxiety will decrease 06/01/2017 1340 - Adequate for Discharge by Evalee Jefferson, RN Elimination: Will not experience complications related to bowel motility 06/01/2017 1340 - Adequate for Discharge by Jaime Dome, Vertell Limber, RN Will not experience complications related to urinary retention 06/01/2017 1340 - Adequate for Discharge by Melitza Metheny, Vertell Limber, RN Pain Managment: General experience of comfort will improve 06/01/2017 1340 - Adequate for Discharge by Evalee Jefferson, RN Safety: Ability to remain free from injury will improve 06/01/2017 1340 - Adequate for Discharge by Kamariya Blevens, Vertell Limber, RN Skin Integrity: Risk for impaired skin integrity will decrease 06/01/2017 1340 - Adequate for Discharge by  Evalee Jefferson, RN Education: Knowledge of disease or condition will improve 06/01/2017 1340 - Adequate for Discharge by Ryota Treece, Vertell Limber, RN Knowledge of secondary prevention will improve 06/01/2017 1340 - Adequate for Discharge by Sanjuanita Condrey, Vertell Limber, RN Knowledge of patient specific risk factors addressed and post discharge goals established will improve 06/01/2017 1340 - Adequate for Discharge by Evalee Jefferson, RN Coping: Will verbalize positive feelings about self 06/01/2017 1340 - Adequate for Discharge by Ahliyah Nienow, Vertell Limber, [place] Behavior/Discharge Planning: Ability to manage health-related needs will improve 06/01/2017 1340 - Adequate for Discharge by Evalee Jefferson, RN Nutrition: Risk of aspiration will decrease 06/01/2017 1340 - Adequate for Discharge by Evalee Jefferson, RN Ischemic Stroke/TIA Tissue Perfusion: Complications of ischemic stroke/TIA will be minimized 06/01/2017 1340 - Adequate for Discharge by Hervey Wedig, Vertell Limber, RN

## 2017-06-01 NOTE — Discharge Instructions (Signed)

## 2017-06-03 ENCOUNTER — Telehealth: Payer: Self-pay | Admitting: *Deleted

## 2017-06-03 NOTE — Consult Note (Signed)
            [facility] CM Primary Care Navigator  06/03/2017  [name] 1945/04/15 [id number]   Attemptto seepatient at the bedside to identify possible discharge needs butshe was already discharged home over the weekend. Patient was admitted with right-sided numbness, weakness and slurred speech, initial CT of the head was negative for acute intracranial abnormality.  Primary care provider's office is listed as providing transition of care (TOC).  Patient has discharge instruction to follow-up with primary care provider within 1 week post discharge; also to follow-up with neurology and gastroenterology as outpatient.   For additional questions please contact:  Edwena Felty A. Ricardo Schubach, BSN, RN-BC [facility] PRIMARY CARE Navigator Cell: [phone]

## 2017-06-03 NOTE — Telephone Encounter (Signed)
No answer

## 2017-06-04 NOTE — Telephone Encounter (Signed)
SIGRID SCHWEBACH IDP:[id number] DOB: 14-Mar-1946 DOA: 05/30/2017  PCP: Ann Held, DO  Admit date: 05/30/2017 Discharge date: 06/01/2017  Admitted From: Home Disposition: Home  Recommendations for Outpatient Follow-up:  1. Follow up with PCP in 1week with repeat CBC/BMP 2. Follow-up with neurology as an outpatient 3. Follow-up with gastroenterology as an outpatient   Home Health: No Equipment/Devices: None  Discharge Condition: Stable CODE STATUS: Full Diet recommendation: Heart Healthy /diet as per SLP recommendations  Transition Care Management Follow-up Telephone Call    How have you been since you were released from the hospital? Doing well   Do you understand why you were in the hospital? yes   Do you understand the discharge instructions? yes   Where were you discharged to? Home with family   Items Reviewed:  Medications reviewed: pt states she will bring her list with her to appt. Denies questions/ concerns about meds  Allergies reviewed: yes  Dietary changes reviewed: yes  Referrals reviewed: yes   Functional Questionnaire:   Activities of Daily Living (ADLs):   She states they are independent in the following: ambulation, bathing and hygiene, feeding, continence, grooming, toileting and dressing States they require assistance with the following: na  Any transportation issues/concerns?: no  Any patient concerns? no   Confirmed importance and date/time of follow-up visits scheduled yes  Provider Appointment booked with [facility] 06/06/17 @2 .  Confirmed with patient if condition begins to worsen call PCP or go to the ER.  Patient was given the office number and encouraged to call back with question or concerns.  : yes

## 2017-06-05 LAB — CULTURE, BLOOD (ROUTINE X 2)
Culture: NO GROWTH
Culture: NO GROWTH

## 2017-06-06 ENCOUNTER — Encounter: Payer: Self-pay | Admitting: Family Medicine

## 2017-06-06 ENCOUNTER — Ambulatory Visit (INDEPENDENT_AMBULATORY_CARE_PROVIDER_SITE_OTHER): Payer: PPO | Admitting: Family Medicine

## 2017-06-06 VITALS — BP 138/80 | HR 70 | Temp 97.9°F | Resp 16 | Ht 60.0 in | Wt 99.0 lb

## 2017-06-06 DIAGNOSIS — D638 Anemia in other chronic diseases classified elsewhere: Secondary | ICD-10-CM

## 2017-06-06 DIAGNOSIS — E785 Hyperlipidemia, unspecified: Secondary | ICD-10-CM | POA: Diagnosis not present

## 2017-06-06 DIAGNOSIS — N179 Acute kidney failure, unspecified: Secondary | ICD-10-CM

## 2017-06-06 DIAGNOSIS — G459 Transient cerebral ischemic attack, unspecified: Secondary | ICD-10-CM | POA: Diagnosis not present

## 2017-06-06 MED ORDER — ATORVASTATIN CALCIUM 20 MG PO TABS: 20.0000 mg | ORAL_TABLET | Freq: Every day | ORAL | 0 refills | Status: DC

## 2017-06-06 MED ORDER — APIXABAN 5 MG PO TABS: 5.0000 mg | ORAL_TABLET | Freq: Two times a day (BID) | ORAL | 0 refills | Status: DC

## 2017-06-06 NOTE — Patient Instructions (Signed)
Transient Ischemic Attack A transient ischemic attack (TIA) is a "warning stroke" that causes stroke-like symptoms that then go away quickly. The symptoms of a TIA come on suddenly, and they last less than 24 hours. Unlike a stroke, a TIA does not cause permanent damage to the brain. It is important to know the symptoms of a TIA and what to do. Seek medical care right away, even if your symptoms go away. Having a TIA is a sign that you are at higher risk for a permanent stroke. Lifestyle changes and medical treatments can help prevent a stroke. What are the causes? This condition is caused by a temporary blockage in an artery in the head or neck. The blockage does not allow the brain to get the blood supply it needs and can cause various symptoms. The blockage can be caused by:  Fatty buildup in an artery in the head or neck (atherosclerosis).  A blood clot.  Tearing of an artery (dissection).  Inflammation of an artery (vasculitis).  Sometimes the cause is not known. What increases the risk? Certain factors may make you more likely to develop this condition. Some of these factors are things that you can change, such as:  Obesity.  Using products that contain nicotine or tobacco, such as cigarettes and e-cigarettes.  Taking oral birth control, especially if you also use tobacco.  Lack of physical inactivity.  Excessive use of alcohol.  Use of drugs, especially cocaine and methamphetamine.  Other risk factors include:  High blood pressure (hypertension).  High cholesterol.  Diabetes mellitus.  Heart disease (coronary artery disease).  Atrial fibrillation.  Being African American or Hispanic.  Being over the age of 60.  Being female.  Family history of stroke.  Previous history of blood clots, stroke, TIA, or heart attack.  Sickle cell disease.  Being a woman with a history of preeclampsia.  Migraine headache.  Sleep apnea.  Chronic inflammatory diseases, such  as rheumatoid arthritis or lupus.  Blood clotting disorders (hypercoagulable state).  What are the signs or symptoms? Symptoms of this condition are the same as those of a stroke, but they are temporary. The symptoms develop suddenly, and they go away quickly, usually within minutes to hours. Symptoms may include sudden:  Weakness or numbness in your face, arm, or leg, especially on one side of your body.  Trouble walking or difficulty moving your arms or legs.  Trouble speaking, understanding speech, or both (aphasia).  Vision changes in one or both eyes. These include double vision, blurred vision, or loss of vision.  Dizziness.  Confusion.  Loss of balance or coordination.  Nausea and vomiting.  Severe headache with no known cause.  If possible, make note of the exact time that you last felt like your normal self and what time your symptoms started. Tell your health care provider. How is this diagnosed? This condition may be diagnosed based on:  Your symptoms and medical history.  A physical exam.  Imaging tests, usually a CT or MRI scan of the brain.  Blood tests.  You may also have other tests, including:  Electrocardiogram (ECG).  Echocardiogram.  Carotid ultrasound.  A scan of the brain circulation (CT angiogram or MRI angiogram).  Continuous heart monitoring.  How is this treated? The goal of treatment is to reduce the risk for a subsequent stroke. Treatment may include stroke prevention therapies such as:  Changes to diet or lifestyle to decrease your risk. Lifestyle changes may include exercising and stopping smoking.    Medicines to thin the blood (antiplatelets or anticoagulants).  Blood pressure medicines.  Medicines to reduce cholesterol.  Treating other health conditions, such as diabetes or atrial fibrillation.  If testing shows that you have narrowing in the arteries to your brain, your health care provider may recommend a procedure, such  as:  Carotid endarterectomy. This is a surgery to remove the blockage from your artery.  Carotid angioplasty and stenting. This is a procedure to open or widen an artery in the neck using a metal mesh tube (stent). The stent helps keep the artery open by supporting the artery walls.  Follow these instructions at home: Medicines  Take over-the-counter and prescription medicines only as told by your health care provider.  If you were told to take a medicine to thin your blood, such as aspirin or an anticoagulant, take it exactly as told by your health care provider. ? Taking too much blood-thinning medicine can cause bleeding. ? If you do not take enough blood-thinning medicine, you will not have the protection that you need against a stroke and other problems. Eating and drinking  Eat 5 or more servings of fruits and vegetables each day.  Follow instructions from your health care provider about diet. You may need to follow a certain nutrition plan to help manage risk factors for stroke, such as high blood pressure, high cholesterol, diabetes, or obesity. This may include: ? Eating a low-fat, low-salt diet. ? Including a lot of fiber in your diet. ? Limiting the amount of carbohydrates and sugar in your diet.  Limit alcohol intake to no more than 1 drink a day for nonpregnant women and 2 drinks a day for men. One drink equals 12 oz of beer, 5 oz of wine, or 1 oz of hard liquor. General instructions  Maintain a healthy weight.  Stay physically active. Try to get at least 30 minutes of exercise on most or all days.  Find out if you have sleep apnea, and seek treatment if needed.  Do not use any products that contain nicotine or tobacco, such as cigarettes and e-cigarettes. If you need help quitting, ask your health care provider.  Do not abuse drugs.  Keep all follow-up visits as told by your health care provider. This is important. Where to find more information:  American Stroke  Association: www.strokeassociation.org  National Stroke Association: www.stroke.org Get help right away if:  You have chest pain or an irregular heartbeat.  You have any symptoms of stroke. The acronym BEFAST is an easy way to remember the main warning signs of stroke. ? B = Balance problems. Signs include dizziness, sudden trouble walking, or loss of balance. ? E = Eye problems. This includes trouble seeing or a sudden change in vision. ? F = Face changes. This includes sudden weakness or numbness of the face, or the face or eyelid drooping to one side. ? A = Arm weakness or numbness. This happens suddenly and usually on one side of the body. ? S = Speech problems. This includes trouble speaking or trouble understanding speech. ? T = Time. Time to call 911 or seek emergency care. Do not wait to see if symptoms will go away. Make note of the time your symptoms started.  Other signs of stroke may include: ? A sudden, severe headache with no known cause. ? Nausea or vomiting. ? Seizure. These symptoms may represent a serious problem that is an emergency. Do not wait to see if the symptoms will go away. Get   medical help right away. Call your local emergency services (911 in the [place] Do not drive yourself to the hospital. Summary  A TIA happens when an artery in the head or neck is blocked, leading to stroke-like symptoms that then go away quickly. The blockage clears before there is any permanent brain damage. A TIA is a medical emergency and requires immediate medical attention.  Symptoms of this condition are the same as those of a stroke, but they are temporary. The symptoms usually develop suddenly, and they go away quickly, usually within minutes to hours.  Having a TIA means that you are at high risk of a stroke in the near future.  Treatment may include medicines to thin the blood as well as medicines, diet changes, and lifestyle changes to manage conditions that increase the risk  of another TIA or a stroke. This information is not intended to replace advice given to you by your health care provider. Make sure you discuss any questions you have with your health care provider. Document Released: 12/13/2004 Document Revised: 04/17/2016 Document Reviewed: 04/17/2016 Elsevier Interactive Patient Education  2018 [employer]  

## 2017-06-06 NOTE — Assessment & Plan Note (Signed)
No residual defects  F/u neuro  Pt is on eliquis

## 2017-06-06 NOTE — Assessment & Plan Note (Signed)
Pt has appointment with GI tomorrow

## 2017-06-06 NOTE — Assessment & Plan Note (Signed)
F/u nephrology

## 2017-06-06 NOTE — Progress Notes (Signed)
Patient ID: [name], female    DOB: 1945-10-31  Age: 72 y.o. MRN: [id number]    Subjective:  Subjective  HPI [name] presents for f/u hosp from [place], a[place], and anemia.    Review of Systems  Constitutional: Positive for fatigue. Negative for activity change, appetite change and unexpected weight change.  Respiratory: Negative for cough and shortness of breath.   Cardiovascular: Negative for chest pain and palpitations.  Neurological: Negative for dizziness, tremors, seizures, syncope, facial asymmetry, speech difficulty, weakness, light-headedness, numbness and headaches.  Psychiatric/Behavioral: Negative for behavioral problems and dysphoric mood. The patient is not nervous/anxious.     History Past Medical History:  Diagnosis Date  . Anemia   . Anginal pain ([place])   . Arthritis    right hip  . Atrial fibrillation ([place])   . Blood transfusion   . CAD (coronary artery disease)    mild disease per cath in 2011  . CHF (congestive heart failure) ([place])   . Chronic abdominal pain   . Chronic nausea   . Chronic pain syndrome   . COPD (chronic obstructive pulmonary disease) (HCC)    continuous O2- 2l  . Diverticulitis   . Elbow fracture, left aug 2012  . Elevated liver enzymes   . Fibromyalgia    COUPLE OF TIMES A YEAR-PT HAS EPISODES OF CONFUSION-USUALLY INVOLVES DAY/NIGHT REVERSAL AND EPISODES OF TWITCHING AND FALLS--SEES DR. Jacelyn Grip - NEUROLOGIST-LAST SEEN 07/10/11  . Gastroparesis   . GERD (gastroesophageal reflux disease)   . H/O: GI bleed    from Pradaxa  . Hepatitis    hx of in high school   . HTN (hypertension)    under control; has been on med. x "years"  . Hx MRSA infection   . Hx of gastric ulcer   . Hx of stress fx aug 2011   right hip   . LBBB (left bundle branch block)   . Oxygen dependent    2 liters via nasal cannula at all times  . Pacemaker    CRT therapy; followed by Dr. Caryl Comes  . Pleural effusion     s/p right thoracentesis 03/09  . Pneumonia  - 03/09, 12/09, 02/10, 06/10   MOST RECENT FEB 2013  . Primary dilated cardiomyopathy (HCC)    EF 45 to 50% per echo in Jan 2012  . RLS (restless legs syndrome)   . Silent aspiration   . Small bowel obstruction ([place])   . Urinary incontinence     -INTERSTIM IMPLANT NOT FUNCTIONING PER PT  . Venous embolism and thrombosis of subclavian vein ([place])    after pacemaker insertion Oct 2011  . Vitamin B12 deficiency     She has a past surgical history that includes Appendectomy; pacemaker placement (12/21/2009); Peg removed; Tonsillectomy (age 46); Interstim implant revision (03/06/2011); Portacath placement (12/16/2009); Total abdominal hysterectomy; Interstim Implant placement (05/28/2006 - stage I); Interstim implant revision (10/23/2007); Cystoscopy with injection (04/30/2006); Small intestine surgery (05/20/2001); Cardiac catheterization (04/08/2006, 11/16/2009); Cystoscopy (11/11/2008); Cystoscopy w/ retrogrades (11/11/2008); Colectomy; Colectomy (02/04/2000); Gastrocutaneous fistula closure; Exploratory laparotomy (04/27/2009); Port-a-cath removal (07/27/2011); Total hip arthroplasty (08/03/2011); Interstim Implant placement (02/05/2012); Esophagogastroduodenoscopy (N/A, 02/02/2013); and Savory dilation (N/A, 02/02/2013).   Her family history includes Breast cancer in her maternal aunt; Diabetes in her paternal grandfather; Heart disease in her brother, father, mother, and paternal aunt; Stroke in her mother.She reports that she quit smoking about 31 years ago. She has a 20.00 pack-year smoking history. She has never used smokeless tobacco. She  reports that she does not drink alcohol or use drugs.  Current Outpatient Medications on File Prior to Visit  Medication Sig Dispense Refill  . carvedilol (COREG) 25 MG tablet TAKE 1 TABLET BY MOUTH TWICE DAILY (Patient taking differently: TAKE 1 TABLET (25mg ) BY MOUTH TWICE DAILY) 322 tablet 1  . folic acid (FOLVITE) 1 MG tablet Take 1 mg by mouth daily.    . furosemide  (LASIX) 40 MG tablet TAKE ONE TABLET BY MOUTH IN THE MORNING (Patient taking differently: TAKE ONE TABLET (40mg ) BY MOUTH IN THE MORNING) 90 tablet 1  . gabapentin (NEURONTIN) 600 MG tablet Take 1 tablet (600 mg total) by mouth 3 (three) times daily.    . nitroGLYCERIN (NITROSTAT) 0.4 MG SL tablet Place 1 tablet (0.4 mg total) under the tongue every 5 (five) minutes as needed. CHEST PAIN 25 tablet 5  . oxyCODONE-acetaminophen (PERCOCET) 10-325 MG tablet Take 1 tablet by mouth every 4 (four) hours as needed. 120 tablet 0  . pantoprazole (PROTONIX) 40 MG tablet Take 1 tablet (40 mg total) by mouth 2 (two) times daily. 60 tablet 0  . promethazine (PHENERGAN) 25 MG tablet Take 1 tablet (25 mg total) by mouth every 8 (eight) hours as needed. for nausea 40 tablet 5  . Tiotropium Bromide Monohydrate (SPIRIVA RESPIMAT) 1.25 MCG/ACT AERS Inhale 2 puffs into the lungs daily.    [name] tiZANidine (ZANAFLEX) 4 MG tablet Take 1 tablet (4 mg total) by mouth 3 (three) times daily as needed. for muscle spams 90 tablet 2  . zolpidem (AMBIEN) 5 MG tablet TAKE 1 TABLET BY MOUTH ONCE DAILY AT BEDTIME AS NEEDED FOR  SLEEP (Patient taking differently: Take 5 mg by mouth at bedtime as needed for sleep. ) 30 tablet 2  . [DISCONTINUED] iron dextran complex (INFED) 50 MG/ML injection Please give infed infusion (no test dose needed) over 4 hours per pharmacy calculated dose. Ht: 5'2 Wt: 99 pounds Hgb: 12 1 mL 0   No current facility-administered medications on file prior to visit.      Objective:  Objective  Physical Exam  Constitutional: She is oriented to person, place, and time. She appears well-developed and well-nourished.  HENT:  Head: Normocephalic and atraumatic.  Eyes: Conjunctivae and EOM are normal.  Neck: Normal range of motion. Neck supple. No JVD present. Carotid bruit is not present. No thyromegaly present.  Cardiovascular: Normal rate, regular rhythm and normal heart sounds.  No murmur  heard. Pulmonary/Chest: Effort normal and breath sounds normal. No respiratory distress. She has no wheezes. She has no rales. She exhibits no tenderness.  Musculoskeletal: She exhibits no edema.  Neurological: She is alert and oriented to person, place, and time. She has normal reflexes. She displays normal reflexes. No cranial nerve deficit. She exhibits normal muscle tone. Coordination normal.  Psychiatric: She has a normal mood and affect. Her behavior is normal. Judgment and thought content normal.  Nursing note and vitals reviewed.  BP 138/80 (BP Location: Right Arm, Cuff Size: Normal)   Pulse 70   Temp 97.9 F (36.6 C) (Oral)   Resp 16   Ht 5' (1.524 m)   Wt 99 lb (44.9 kg)   SpO2 95% Comment: 3 liters of o2  BMI 19.33 kg/m  Wt Readings from Last 3 Encounters:  06/06/17 99 lb (44.9 kg)  05/30/17 103 lb 2.8 oz (46.8 kg)  05/30/17 99 lb 9.6 oz (45.2 kg)     Lab Results  Component Value Date   WBC 6.7  06/01/2017   HGB 9.9 (L) 06/01/2017   HCT 31.3 (L) 06/01/2017   PLT 195 06/01/2017   GLUCOSE 87 06/01/2017   CHOL 140 05/31/2017   TRIG 67 05/31/2017   HDL 43 05/31/2017   LDLCALC 84 05/31/2017   ALT 29 06/01/2017   AST 28 06/01/2017   NA 138 06/01/2017   K 4.5 06/01/2017   CL 106 06/01/2017   CREATININE 0.83 06/01/2017   BUN 9 06/01/2017   CO2 23 06/01/2017   TSH 4.43 06/26/2010   INR 1.13 05/30/2017   HGBA1C 5.2 05/31/2017    Ct Angio Head W Or Wo Contrast  Result Date: 05/31/2017 CLINICAL DATA:  72 y/o F; TIA workup, patient presented with slurred speech and right arm weakness/numbness. EXAM: CT ANGIOGRAPHY HEAD AND NECK TECHNIQUE: Multidetector CT imaging of the head and neck was performed using the standard protocol during bolus administration of intravenous contrast. Multiplanar CT image reconstructions and MIPs were obtained to evaluate the vascular anatomy. Carotid stenosis measurements (when applicable) are obtained utilizing NASCET criteria, using the  distal internal carotid diameter as the denominator. CONTRAST:  84mL ISOVUE-370 IOPAMIDOL (ISOVUE-370) INJECTION 76% COMPARISON:  05/30/2017 CT head. FINDINGS: CT HEAD FINDINGS Brain: No acute stroke, hemorrhage, or focal mass effect. No hydrocephalus, extra-axial collection, or effacement of basilar cisterns. Stable chronic microvascular ischemic changes and parenchymal volume loss of the brain. No abnormal enhancement. 9 x 5 x 7 mm left frontal calcified dural nodule (series 9, image 134 and series 5, image 41), dural plaque or meningioma. Skull: No acute abnormality. Sinus and orbits: Mild diffuse paranasal sinus mucosal thickening. Normal aeration of mastoid air cells. Orbits are unremarkable. Other: Negative. CTA NECK FINDINGS Aortic arch: Standard branching. Imaged portion shows no evidence of aneurysm or dissection. No significant stenosis of the major arch vessel origins. Mild calcific atherosclerosis. Right carotid system: No evidence of dissection, stenosis (50% or greater) or occlusion. Mild non stenotic calcific atherosclerosis of right carotid bifurcation. Left carotid system: No evidence of dissection, stenosis (50% or greater) or occlusion. Mild non stenotic calcific atherosclerosis of left carotid bifurcation. Vertebral arteries: Widely patent right vertebral artery. Occluded left V1 and V2 segments to the C3 level. Downstream left vertebral artery to the basilar junction is patent. Skeleton: Moderate cervical spondylosis with disc and facet degenerative changes greatest at the C4-C6 levels. No high-grade bony canal stenosis. Other neck: Mildly enlarged right upper paratracheal lymph nodes, likely reactive. Upper chest: Numerous tiny tree-in-bud nodules enlarged clustered nodules in bronchovascular distribution, greatest on the right lung apex, compatible with chronic bronchiolitis. Review of the MIP images confirms the above findings CTA HEAD FINDINGS Anterior circulation: No significant stenosis,  proximal occlusion, aneurysm, or vascular malformation. Posterior circulation: No significant stenosis, proximal occlusion, aneurysm, or vascular malformation. Venous sinuses: As permitted by contrast timing, patent. Anatomic variants: Bilateral fetal PCA. Delayed phase: No abnormal enhancement. Review of the MIP images confirms the above findings IMPRESSION: CT head: 1. No acute intracranial abnormality identified. 2. Stable chronic microvascular ischemic changes and parenchymal volume loss of the brain. 3. Subcentimeter left frontal dural calcification may be a dural plaque or tiny meningioma. CTA neck: 1. Occluded left vertebral artery from origin to C3 level, age indeterminate. Patent diminutive downstream left vertebral artery to the vertebrobasilar junction. 2. Otherwise patent bilateral carotid systems and dominant right vertebral artery. No dissection, aneurysm, or significant stenosis by NASCET criteria is identified. 3. Moderate spondylosis of the cervical spine. 4. Findings of chronic bronchiolitis in lung apices, probably atypical  mycobacterium/MAI. CTA head: Patent anterior and posterior intracranial circulation. No large vessel occlusion, aneurysm, or significant stenosis. Electronically Signed   By: Kristine Garbe M.D.   On: 05/31/2017 15:49   Ct Head Wo Contrast  Result Date: 05/30/2017 CLINICAL DATA:  72 y/o F; 2 days of right-sided weakness. Slurred speech and confusion. EXAM: CT HEAD WITHOUT CONTRAST TECHNIQUE: Contiguous axial images were obtained from the base of the skull through the vertex without intravenous contrast. COMPARISON:  06/04/2011 CT head FINDINGS: Brain: No evidence of acute infarction, hemorrhage, hydrocephalus, extra-axial collection or mass lesion/mass effect. Few stable nonspecific foci of hypoattenuation are present in subcortical white matter compatible mild chronic microvascular ischemic changes. Vascular: Mild calcific atherosclerosis of carotid siphons. No  hyperdense vessel identified. Skull: Normal. Negative for fracture or focal lesion. Sinuses/Orbits: Mild paranasal sinus mucosal thickening. Normal aeration of mastoid air cells. Bilateral intra-ocular lens replacement. Other: None. IMPRESSION: 1. No acute intracranial abnormality identified. 2. Stable mild chronic microvascular ischemic changes. 3. Mild paranasal sinus disease. Electronically Signed   By: Kristine Garbe M.D.   On: 05/30/2017 18:31   Ct Angio Neck W Or Wo Contrast  Result Date: 05/31/2017 CLINICAL DATA:  72 y/o F; TIA workup, patient presented with slurred speech and right arm weakness/numbness. EXAM: CT ANGIOGRAPHY HEAD AND NECK TECHNIQUE: Multidetector CT imaging of the head and neck was performed using the standard protocol during bolus administration of intravenous contrast. Multiplanar CT image reconstructions and MIPs were obtained to evaluate the vascular anatomy. Carotid stenosis measurements (when applicable) are obtained utilizing NASCET criteria, using the distal internal carotid diameter as the denominator. CONTRAST:  29mL ISOVUE-370 IOPAMIDOL (ISOVUE-370) INJECTION 76% COMPARISON:  05/30/2017 CT head. FINDINGS: CT HEAD FINDINGS Brain: No acute stroke, hemorrhage, or focal mass effect. No hydrocephalus, extra-axial collection, or effacement of basilar cisterns. Stable chronic microvascular ischemic changes and parenchymal volume loss of the brain. No abnormal enhancement. 9 x 5 x 7 mm left frontal calcified dural nodule (series 9, image 134 and series 5, image 41), dural plaque or meningioma. Skull: No acute abnormality. Sinus and orbits: Mild diffuse paranasal sinus mucosal thickening. Normal aeration of mastoid air cells. Orbits are unremarkable. Other: Negative. CTA NECK FINDINGS Aortic arch: Standard branching. Imaged portion shows no evidence of aneurysm or dissection. No significant stenosis of the major arch vessel origins. Mild calcific atherosclerosis. Right  carotid system: No evidence of dissection, stenosis (50% or greater) or occlusion. Mild non stenotic calcific atherosclerosis of right carotid bifurcation. Left carotid system: No evidence of dissection, stenosis (50% or greater) or occlusion. Mild non stenotic calcific atherosclerosis of left carotid bifurcation. Vertebral arteries: Widely patent right vertebral artery. Occluded left V1 and V2 segments to the C3 level. Downstream left vertebral artery to the basilar junction is patent. Skeleton: Moderate cervical spondylosis with disc and facet degenerative changes greatest at the C4-C6 levels. No high-grade bony canal stenosis. Other neck: Mildly enlarged right upper paratracheal lymph nodes, likely reactive. Upper chest: Numerous tiny tree-in-bud nodules enlarged clustered nodules in bronchovascular distribution, greatest on the right lung apex, compatible with chronic bronchiolitis. Review of the MIP images confirms the above findings CTA HEAD FINDINGS Anterior circulation: No significant stenosis, proximal occlusion, aneurysm, or vascular malformation. Posterior circulation: No significant stenosis, proximal occlusion, aneurysm, or vascular malformation. Venous sinuses: As permitted by contrast timing, patent. Anatomic variants: Bilateral fetal PCA. Delayed phase: No abnormal enhancement. Review of the MIP images confirms the above findings IMPRESSION: CT head: 1. No acute intracranial abnormality identified. 2. Stable  chronic microvascular ischemic changes and parenchymal volume loss of the brain. 3. Subcentimeter left frontal dural calcification may be a dural plaque or tiny meningioma. CTA neck: 1. Occluded left vertebral artery from origin to C3 level, age indeterminate. Patent diminutive downstream left vertebral artery to the vertebrobasilar junction. 2. Otherwise patent bilateral carotid systems and dominant right vertebral artery. No dissection, aneurysm, or significant stenosis by NASCET criteria is  identified. 3. Moderate spondylosis of the cervical spine. 4. 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 CTA head: Patent anterior and posterior intracranial circulation. No large vessel occlusion, aneurysm, or significant stenosis. Electronically Signed   By: Kristine Garbe M.D.   On: 05/31/2017 15:49     Assessment & Plan:  Plan  I am having [name] maintain her promethazine, furosemide, carvedilol, zolpidem, nitroGLYCERIN, oxyCODONE-acetaminophen, tiZANidine, folic acid, Tiotropium Bromide Monohydrate, gabapentin, pantoprazole, atorvastatin, and apixaban.  Meds ordered this encounter  Medications  . atorvastatin (LIPITOR) 20 MG tablet    Sig: Take 1 tablet (20 mg total) by mouth daily at 6 PM.    Dispense:  30 tablet    Refill:  0  . apixaban (ELIQUIS) 5 MG TABS tablet    Sig: Take 1 tablet (5 mg total) by mouth 2 (two) times daily.    Dispense:  60 tablet    Refill:  0    Problem List Items Addressed This Visit      Unprioritized   AKI (acute kidney injury) ([place])    F/u nephrology      Anemia, chronic disease    Pt has appointment with GI tomorrow       Relevant Orders   Basic Metabolic Panel (BMET)   CBC w/Diff   TIA (transient ischemic attack) - Primary    No residual defects  F/u neuro  Pt is on eliquis      Relevant Medications   atorvastatin (LIPITOR) 20 MG tablet   apixaban (ELIQUIS) 5 MG TABS tablet    Other Visit Diagnoses    Acute renal failure, unspecified acute renal failure type ([place])       Hyperlipidemia LDL goal <100       Relevant Medications   atorvastatin (LIPITOR) 20 MG tablet   apixaban (ELIQUIS) 5 MG TABS tablet   Other Relevant Orders   Basic Metabolic Panel (BMET)      Follow-up: Return in about 3 months (around 09/06/2017), or if symptoms worsen or fail to improve.  Ann Held, DO

## 2017-06-07 ENCOUNTER — Ambulatory Visit: Payer: PPO | Admitting: Internal Medicine

## 2017-06-07 ENCOUNTER — Encounter: Payer: Self-pay | Admitting: Internal Medicine

## 2017-06-07 VITALS — BP 88/50 | HR 68 | Ht 60.0 in | Wt 99.5 lb

## 2017-06-07 DIAGNOSIS — Z7901 Long term (current) use of anticoagulants: Secondary | ICD-10-CM | POA: Diagnosis not present

## 2017-06-07 DIAGNOSIS — D638 Anemia in other chronic diseases classified elsewhere: Secondary | ICD-10-CM | POA: Diagnosis not present

## 2017-06-07 NOTE — Progress Notes (Signed)
TIAWANA FORGY 72 y.o. 12/18/1945 [id number]  Assessment & Plan:   Encounter Diagnoses  Name Primary?  [name] Anemia of chronic disease Yes  . Chronic anticoagulation     Stay on Eliquis to reduce the risk of recurrent stroke.  See me as needed.  CC: Ann Held, DO  Subjective:   Chief Complaint:  HPI The patient was hospitalized recently with a stroke or TIA.  Eliquis was started.  We were asked and I answered that I did not think there was any reason with respect to her gastrointestinal problems that would exclude her from taking a blood thinner.  She has no residual deficits from her stroke.  No signs of bleeding.  She has anemia of chronic disease.   EGD 2014 duodenitis  Flex sig 2012 - diverticulosis   Allergies  Allergen Reactions  . Dabigatran Etexilate Mesylate Other (See Comments)    INTERNAL BLEEDING-pradaxa  . [place] [Pentazocine] Other (See Comments)    hallucinations  . Lactate Rash   No outpatient medications have been marked as taking for the 06/07/17 encounter (Appointment) with Gatha Mayer, MD.   Past Medical History:  Diagnosis Date  . Anemia   . Anginal pain ([place])   . Arthritis    right hip  . Atrial fibrillation ([place])   . Blood transfusion   . CAD (coronary artery disease)    mild disease per cath in 2011  . CHF (congestive heart failure) ([place])   . Chronic abdominal pain   . Chronic nausea   . Chronic pain syndrome   . COPD (chronic obstructive pulmonary disease) (H[facility])    continuous O2- 2l  . Diverticulitis   . Elbow fracture, left aug 2012  . Elevated liver enzymes   . Fibromyalgia    COUPLE OF TIMES A YEAR-PT HAS EPISODES OF CONFUSION-USUALLY INVOLVES DAY/NIGHT REVERSAL AND EPISODES OF TWITCHING AND FALLS--SEES DR. Jacelyn Grip - NEUROLOGIST-LAST SEEN 07/10/11  . Gastroparesis   . GERD (gastroesophageal reflux disease)   . H/O: GI bleed    from Pradaxa  . Hepatitis    hx of in high school   . HTN (hypertension)    under  control; has been on med. x "years"  . Hx MRSA infection   . Hx of gastric ulcer   . Hx of stress fx aug 2011   right hip   . LBBB (left bundle branch block)   . Oxygen dependent    2 liters via nasal cannula at all times  . Pacemaker    CRT therapy; followed by Dr. Caryl Comes  . Pleural effusion     s/p right thoracentesis 03/09  . Pneumonia - 03/09, 12/09, 02/10, 06/10   MOST RECENT FEB 2013  . Primary dilated cardiomyopathy ([facility])    EF 45 to 50% per echo in Jan 2012  . RLS (restless legs syndrome)   . Silent aspiration   . Small bowel obstruction ([place])   . Urinary incontinence     -INTERSTIM IMPLANT NOT FUNCTIONING PER PT  . Venous embolism and thrombosis of subclavian vein ([place])    after pacemaker insertion Oct 2011  . Vitamin B12 deficiency    Past Surgical History:  Procedure Laterality Date  . APPENDECTOMY    . CARDIAC CATHETERIZATION  04/08/2006, 11/16/2009  . COLECTOMY     Intestinal resection (5times)  . COLECTOMY  02/04/2000   ex. lap., intra-abd. subtotal colectomy with ileosigmoid colon anastomosies and lysis of adhesions  . CYSTOSCOPY  11/11/2008  .  CYSTOSCOPY W/ RETROGRADES  11/11/2008   right  . CYSTOSCOPY WITH INJECTION  04/30/2006   transurethral collagen injection; incision vaginal stenosis  . ESOPHAGOGASTRODUODENOSCOPY N/A 02/02/2013   Procedure: ESOPHAGOGASTRODUODENOSCOPY (EGD);  Surgeon: Lafayette Dragon, MD;  Location: [facility];  Service: Endoscopy;  Laterality: N/A;  . EXPLORATORY LAPAROTOMY  04/27/2009   lysis of adhesions, gastrostomy tube  . GASTROCUTANEOUS FISTULA CLOSURE    . INTERSTIM IMPLANT PLACEMENT  05/28/2006 - stage I   06/05/2006 - stage II  . INTERSTIM IMPLANT PLACEMENT  02/05/2012   Procedure: [name] IMPLANT FIRST STAGE;  Surgeon: Reece Packer, MD;  Location: [facility];  Service: Urology;  Laterality: N/A;  Replacement of Interstim Lead     . INTERSTIM IMPLANT REVISION  03/06/2011   Procedure: REVISION OF [name];  Surgeon: Reece Packer, MD;  Location: [facility];  Service: Urology;  Laterality: N/A;  Replacement of Neurostimulator  . INTERSTIM IMPLANT REVISION  10/23/2007  . PACEMAKER PLACEMENT  12/21/2009  . Peg removed     with complications  . PORT-A-CATH REMOVAL  07/27/2011   Procedure: REMOVAL PORT-A-CATH;  Surgeon: Rolm Bookbinder, MD;  Location: [facility];  Service: General;  Laterality: N/A;  . PORTACATH PLACEMENT  12/16/2009  . SAVORY DILATION N/A 02/02/2013   Procedure: SAVORY DILATION;  Surgeon: Lafayette Dragon, MD;  Location: [facility];  Service: Endoscopy;  Laterality: N/A;  . SMALL INTESTINE SURGERY  05/20/2001   ex. lap., resection of small bowel stricture; gastrostomy; insertion central line  . TONSILLECTOMY  age 17  . TOTAL ABDOMINAL HYSTERECTOMY     complete  . TOTAL HIP ARTHROPLASTY  08/03/2011   Procedure: TOTAL HIP ARTHROPLASTY ANTERIOR APPROACH;  Surgeon: Mcarthur Rossetti, MD;  Location: [facility];  Service: Orthopedics;  Laterality: Right;   Social History   Social History Narrative   Occupation: Disabled   Married - second time - biologic daughter (in [place]) from first marriage - adopted 4 3 alive with second marriage   Alcohol Use - no   Illicit Drug Use - no   Patient is a former smoker, quit 1988 x74yrs, <1ppd.    Daily Caffeine Use   01/20/2015 - updated   family history includes Breast cancer in her maternal aunt; Diabetes in her paternal grandfather; Heart disease in her brother, father, mother, and paternal aunt; Stroke in her mother.   Review of Systems See HPI  Objective:   Physical Exam BP (!) 88/50 (BP Location: Left Arm, Patient Position: Sitting, Cuff Size: Normal)   Pulse 68   Ht 5' (1.524 m) Comment: height measured without shoes  Wt 99 lb 8 oz (45.1 kg)   BMI 19.43 kg/m  No acute distress

## 2017-06-07 NOTE — Patient Instructions (Signed)
   I think it makes sense for you to take the blood thinner.  I hope that you stay as well as you can and no more strokes.  I appreciate the opportunity to care for you. Gatha Mayer, MD, [facility]

## 2017-06-10 ENCOUNTER — Other Ambulatory Visit (INDEPENDENT_AMBULATORY_CARE_PROVIDER_SITE_OTHER): Payer: PPO

## 2017-06-10 DIAGNOSIS — E785 Hyperlipidemia, unspecified: Secondary | ICD-10-CM | POA: Diagnosis not present

## 2017-06-10 DIAGNOSIS — I4891 Unspecified atrial fibrillation: Secondary | ICD-10-CM | POA: Diagnosis not present

## 2017-06-10 DIAGNOSIS — D638 Anemia in other chronic diseases classified elsewhere: Secondary | ICD-10-CM

## 2017-06-10 DIAGNOSIS — J449 Chronic obstructive pulmonary disease, unspecified: Secondary | ICD-10-CM | POA: Diagnosis not present

## 2017-06-10 DIAGNOSIS — R05 Cough: Secondary | ICD-10-CM | POA: Diagnosis not present

## 2017-06-10 LAB — CBC WITH DIFFERENTIAL/PLATELET
Basophils Absolute: 0.1 10*3/uL (ref 0.0–0.1)
Basophils Relative: 0.6 % (ref 0.0–3.0)
Eosinophils Absolute: 0.3 10*3/uL (ref 0.0–0.7)
Eosinophils Relative: 3.4 % (ref 0.0–5.0)
HCT: 34.2 % — ABNORMAL LOW (ref 36.0–46.0)
Hemoglobin: 11.1 g/dL — ABNORMAL LOW (ref 12.0–15.0)
Lymphocytes Relative: 7.3 % — ABNORMAL LOW (ref 12.0–46.0)
Lymphs Abs: 0.7 10*3/uL (ref 0.7–4.0)
MCHC: 32.5 g/dL (ref 30.0–36.0)
MCV: 89.5 fl (ref 78.0–100.0)
Monocytes Absolute: 0.6 10*3/uL (ref 0.1–1.0)
Monocytes Relative: 7 % (ref 3.0–12.0)
Neutro Abs: 7.5 10*3/uL (ref 1.4–7.7)
Neutrophils Relative %: 81.7 % — ABNORMAL HIGH (ref 43.0–77.0)
Platelets: 300 10*3/uL (ref 150.0–400.0)
RBC: 3.83 Mil/uL — ABNORMAL LOW (ref 3.87–5.11)
RDW: 14.9 % (ref 11.5–15.5)
WBC: 9.2 10*3/uL (ref 4.0–10.5)

## 2017-06-10 LAB — BASIC METABOLIC PANEL
BUN: 14 mg/dL (ref 6–23)
CO2: 32 mEq/L (ref 19–32)
Calcium: 8.8 mg/dL (ref 8.4–10.5)
Chloride: 97 mEq/L (ref 96–112)
Creatinine, Ser: 0.77 mg/dL (ref 0.40–1.20)
GFR: 78.19 mL/min (ref >60.00)
Glucose, Bld: 96 mg/dL (ref 70–99)
Potassium: 4.7 mEq/L (ref 3.5–5.1)
Sodium: 135 mEq/L (ref 135–145)

## 2017-06-18 DIAGNOSIS — H5203 Hypermetropia, bilateral: Secondary | ICD-10-CM | POA: Diagnosis not present

## 2017-06-18 DIAGNOSIS — H524 Presbyopia: Secondary | ICD-10-CM | POA: Diagnosis not present

## 2017-06-19 ENCOUNTER — Emergency Department (HOSPITAL_COMMUNITY): Payer: PPO

## 2017-06-19 ENCOUNTER — Encounter (HOSPITAL_COMMUNITY): Payer: Self-pay

## 2017-06-19 ENCOUNTER — Inpatient Hospital Stay (HOSPITAL_COMMUNITY)
Admission: EM | Admit: 2017-06-19 | Discharge: 2017-07-02 | DRG: 414 | Disposition: A | Discharge status: Home / Self Care | Payer: PPO | Attending: Internal Medicine | Admitting: Internal Medicine

## 2017-06-19 DIAGNOSIS — Z86718 Personal history of other venous thrombosis and embolism: Secondary | ICD-10-CM

## 2017-06-19 DIAGNOSIS — Z888 Allergy status to other drugs, medicaments and biological substances status: Secondary | ICD-10-CM

## 2017-06-19 DIAGNOSIS — Z8711 Personal history of peptic ulcer disease: Secondary | ICD-10-CM

## 2017-06-19 DIAGNOSIS — R7989 Other specified abnormal findings of blood chemistry: Secondary | ICD-10-CM | POA: Diagnosis present

## 2017-06-19 DIAGNOSIS — I5032 Chronic diastolic (congestive) heart failure: Secondary | ICD-10-CM | POA: Diagnosis present

## 2017-06-19 DIAGNOSIS — D509 Iron deficiency anemia, unspecified: Secondary | ICD-10-CM | POA: Diagnosis present

## 2017-06-19 DIAGNOSIS — Z96641 Presence of right artificial hip joint: Secondary | ICD-10-CM | POA: Diagnosis present

## 2017-06-19 DIAGNOSIS — J439 Emphysema, unspecified: Secondary | ICD-10-CM | POA: Diagnosis not present

## 2017-06-19 DIAGNOSIS — I2510 Atherosclerotic heart disease of native coronary artery without angina pectoris: Secondary | ICD-10-CM | POA: Diagnosis present

## 2017-06-19 DIAGNOSIS — K5730 Diverticulosis of large intestine without perforation or abscess without bleeding: Secondary | ICD-10-CM | POA: Diagnosis not present

## 2017-06-19 DIAGNOSIS — I420 Dilated cardiomyopathy: Secondary | ICD-10-CM | POA: Diagnosis present

## 2017-06-19 DIAGNOSIS — K8063 Calculus of gallbladder and bile duct with acute cholecystitis with obstruction: Principal | ICD-10-CM | POA: Diagnosis present

## 2017-06-19 DIAGNOSIS — D62 Acute posthemorrhagic anemia: Secondary | ICD-10-CM | POA: Diagnosis present

## 2017-06-19 DIAGNOSIS — Z8249 Family history of ischemic heart disease and other diseases of the circulatory system: Secondary | ICD-10-CM

## 2017-06-19 DIAGNOSIS — Z9981 Dependence on supplemental oxygen: Secondary | ICD-10-CM

## 2017-06-19 DIAGNOSIS — I110 Hypertensive heart disease with heart failure: Secondary | ICD-10-CM | POA: Diagnosis present

## 2017-06-19 DIAGNOSIS — K3184 Gastroparesis: Secondary | ICD-10-CM | POA: Diagnosis not present

## 2017-06-19 DIAGNOSIS — R531 Weakness: Secondary | ICD-10-CM

## 2017-06-19 DIAGNOSIS — N179 Acute kidney failure, unspecified: Secondary | ICD-10-CM | POA: Diagnosis present

## 2017-06-19 DIAGNOSIS — K828 Other specified diseases of gallbladder: Secondary | ICD-10-CM | POA: Diagnosis not present

## 2017-06-19 DIAGNOSIS — Z803 Family history of malignant neoplasm of breast: Secondary | ICD-10-CM | POA: Diagnosis not present

## 2017-06-19 DIAGNOSIS — K219 Gastro-esophageal reflux disease without esophagitis: Secondary | ICD-10-CM | POA: Diagnosis present

## 2017-06-19 DIAGNOSIS — R7401 Elevation of levels of liver transaminase levels: Secondary | ICD-10-CM

## 2017-06-19 DIAGNOSIS — K8050 Calculus of bile duct without cholangitis or cholecystitis without obstruction: Secondary | ICD-10-CM | POA: Diagnosis not present

## 2017-06-19 DIAGNOSIS — Z833 Family history of diabetes mellitus: Secondary | ICD-10-CM

## 2017-06-19 DIAGNOSIS — E785 Hyperlipidemia, unspecified: Secondary | ICD-10-CM | POA: Diagnosis present

## 2017-06-19 DIAGNOSIS — E875 Hyperkalemia: Secondary | ICD-10-CM | POA: Diagnosis not present

## 2017-06-19 DIAGNOSIS — R740 Nonspecific elevation of levels of transaminase and lactic acid dehydrogenase [LDH]: Secondary | ICD-10-CM

## 2017-06-19 DIAGNOSIS — G459 Transient cerebral ischemic attack, unspecified: Secondary | ICD-10-CM | POA: Diagnosis present

## 2017-06-19 DIAGNOSIS — G894 Chronic pain syndrome: Secondary | ICD-10-CM | POA: Diagnosis present

## 2017-06-19 DIAGNOSIS — Z87891 Personal history of nicotine dependence: Secondary | ICD-10-CM

## 2017-06-19 DIAGNOSIS — E43 Unspecified severe protein-calorie malnutrition: Secondary | ICD-10-CM | POA: Diagnosis present

## 2017-06-19 DIAGNOSIS — I10 Essential (primary) hypertension: Secondary | ICD-10-CM | POA: Diagnosis not present

## 2017-06-19 DIAGNOSIS — N390 Urinary tract infection, site not specified: Secondary | ICD-10-CM | POA: Diagnosis not present

## 2017-06-19 DIAGNOSIS — Z8673 Personal history of transient ischemic attack (TIA), and cerebral infarction without residual deficits: Secondary | ICD-10-CM

## 2017-06-19 DIAGNOSIS — R945 Abnormal results of liver function studies: Secondary | ICD-10-CM | POA: Diagnosis present

## 2017-06-19 DIAGNOSIS — K8033 Calculus of bile duct with acute cholangitis with obstruction: Secondary | ICD-10-CM | POA: Diagnosis present

## 2017-06-19 DIAGNOSIS — I480 Paroxysmal atrial fibrillation: Secondary | ICD-10-CM | POA: Diagnosis not present

## 2017-06-19 DIAGNOSIS — K8020 Calculus of gallbladder without cholecystitis without obstruction: Secondary | ICD-10-CM

## 2017-06-19 DIAGNOSIS — I4892 Unspecified atrial flutter: Secondary | ICD-10-CM | POA: Diagnosis present

## 2017-06-19 DIAGNOSIS — Z823 Family history of stroke: Secondary | ICD-10-CM | POA: Diagnosis not present

## 2017-06-19 DIAGNOSIS — M797 Fibromyalgia: Secondary | ICD-10-CM | POA: Diagnosis not present

## 2017-06-19 DIAGNOSIS — K8592 Acute pancreatitis with infected necrosis, unspecified: Secondary | ICD-10-CM | POA: Diagnosis not present

## 2017-06-19 DIAGNOSIS — Z9049 Acquired absence of other specified parts of digestive tract: Secondary | ICD-10-CM

## 2017-06-19 DIAGNOSIS — Z8614 Personal history of Methicillin resistant Staphylococcus aureus infection: Secondary | ICD-10-CM

## 2017-06-19 DIAGNOSIS — Z950 Presence of cardiac pacemaker: Secondary | ICD-10-CM

## 2017-06-19 DIAGNOSIS — R51 Headache: Secondary | ICD-10-CM | POA: Diagnosis not present

## 2017-06-19 DIAGNOSIS — R932 Abnormal findings on diagnostic imaging of liver and biliary tract: Secondary | ICD-10-CM | POA: Diagnosis not present

## 2017-06-19 DIAGNOSIS — I447 Left bundle-branch block, unspecified: Secondary | ICD-10-CM | POA: Diagnosis present

## 2017-06-19 DIAGNOSIS — I959 Hypotension, unspecified: Secondary | ICD-10-CM | POA: Diagnosis not present

## 2017-06-19 DIAGNOSIS — I482 Chronic atrial fibrillation: Secondary | ICD-10-CM | POA: Diagnosis present

## 2017-06-19 DIAGNOSIS — K8066 Calculus of gallbladder and bile duct with acute and chronic cholecystitis without obstruction: Secondary | ICD-10-CM | POA: Diagnosis not present

## 2017-06-19 DIAGNOSIS — R933 Abnormal findings on diagnostic imaging of other parts of digestive tract: Secondary | ICD-10-CM | POA: Diagnosis not present

## 2017-06-19 DIAGNOSIS — R1011 Right upper quadrant pain: Secondary | ICD-10-CM | POA: Diagnosis not present

## 2017-06-19 DIAGNOSIS — K8012 Calculus of gallbladder with acute and chronic cholecystitis without obstruction: Secondary | ICD-10-CM | POA: Diagnosis not present

## 2017-06-19 DIAGNOSIS — G2581 Restless legs syndrome: Secondary | ICD-10-CM | POA: Diagnosis present

## 2017-06-19 DIAGNOSIS — Z7901 Long term (current) use of anticoagulants: Secondary | ICD-10-CM | POA: Diagnosis not present

## 2017-06-19 DIAGNOSIS — I4891 Unspecified atrial fibrillation: Secondary | ICD-10-CM | POA: Diagnosis present

## 2017-06-19 DIAGNOSIS — Z90710 Acquired absence of both cervix and uterus: Secondary | ICD-10-CM

## 2017-06-19 LAB — I-STAT TROPONIN, ED: Troponin i, poc: 0 ng/mL (ref 0.00–0.08)

## 2017-06-19 LAB — I-STAT CHEM 8, ED
BUN: 20 mg/dL (ref 6–20)
Calcium, Ion: 1.07 mmol/L — ABNORMAL LOW (ref 1.15–1.40)
Chloride: 95 mmol/L — ABNORMAL LOW (ref 101–111)
Creatinine, Ser: 1.3 mg/dL — ABNORMAL HIGH (ref 0.44–1.00)
Glucose, Bld: 147 mg/dL — ABNORMAL HIGH (ref 65–99)
HCT: 31 % — ABNORMAL LOW (ref 36.0–46.0)
Hemoglobin: 10.5 g/dL — ABNORMAL LOW (ref 12.0–15.0)
Potassium: 3.9 mmol/L (ref 3.5–5.1)
Sodium: 137 mmol/L (ref 135–145)
TCO2: 30 mmol/L (ref 22–32)

## 2017-06-19 NOTE — ED Notes (Signed)
Attempted IV start x2.

## 2017-06-19 NOTE — ED Triage Notes (Signed)
Reports TIA two weeks ago, today symptoms returned, dropping things, headache, trouble walking. Neuro intact bilaterally, LSN the AM at 8

## 2017-06-20 ENCOUNTER — Other Ambulatory Visit: Payer: Self-pay

## 2017-06-20 ENCOUNTER — Observation Stay (HOSPITAL_COMMUNITY): Payer: PPO

## 2017-06-20 ENCOUNTER — Emergency Department (HOSPITAL_COMMUNITY): Payer: PPO

## 2017-06-20 DIAGNOSIS — I5032 Chronic diastolic (congestive) heart failure: Secondary | ICD-10-CM

## 2017-06-20 DIAGNOSIS — N179 Acute kidney failure, unspecified: Secondary | ICD-10-CM | POA: Diagnosis not present

## 2017-06-20 DIAGNOSIS — K828 Other specified diseases of gallbladder: Secondary | ICD-10-CM | POA: Diagnosis not present

## 2017-06-20 DIAGNOSIS — G459 Transient cerebral ischemic attack, unspecified: Secondary | ICD-10-CM | POA: Diagnosis not present

## 2017-06-20 DIAGNOSIS — I10 Essential (primary) hypertension: Secondary | ICD-10-CM | POA: Diagnosis not present

## 2017-06-20 DIAGNOSIS — R531 Weakness: Secondary | ICD-10-CM | POA: Diagnosis not present

## 2017-06-20 DIAGNOSIS — R945 Abnormal results of liver function studies: Secondary | ICD-10-CM | POA: Diagnosis not present

## 2017-06-20 DIAGNOSIS — K8020 Calculus of gallbladder without cholecystitis without obstruction: Secondary | ICD-10-CM | POA: Diagnosis not present

## 2017-06-20 DIAGNOSIS — E785 Hyperlipidemia, unspecified: Secondary | ICD-10-CM | POA: Diagnosis present

## 2017-06-20 DIAGNOSIS — I2510 Atherosclerotic heart disease of native coronary artery without angina pectoris: Secondary | ICD-10-CM | POA: Diagnosis present

## 2017-06-20 DIAGNOSIS — K219 Gastro-esophageal reflux disease without esophagitis: Secondary | ICD-10-CM | POA: Diagnosis not present

## 2017-06-20 DIAGNOSIS — I480 Paroxysmal atrial fibrillation: Secondary | ICD-10-CM

## 2017-06-20 DIAGNOSIS — D509 Iron deficiency anemia, unspecified: Secondary | ICD-10-CM

## 2017-06-20 DIAGNOSIS — I959 Hypotension, unspecified: Secondary | ICD-10-CM | POA: Diagnosis not present

## 2017-06-20 DIAGNOSIS — J439 Emphysema, unspecified: Secondary | ICD-10-CM

## 2017-06-20 DIAGNOSIS — E43 Unspecified severe protein-calorie malnutrition: Secondary | ICD-10-CM | POA: Diagnosis present

## 2017-06-20 LAB — URINALYSIS, ROUTINE W REFLEX MICROSCOPIC
Bilirubin Urine: NEGATIVE
Glucose, UA: NEGATIVE mg/dL
Hgb urine dipstick: NEGATIVE
Ketones, ur: NEGATIVE mg/dL
Leukocytes, UA: NEGATIVE
Nitrite: NEGATIVE
Protein, ur: NEGATIVE mg/dL
Specific Gravity, Urine: 1.015 (ref 1.005–1.030)
pH: 6 (ref 5.0–8.0)

## 2017-06-20 LAB — CBC
HCT: 31.5 % — ABNORMAL LOW (ref 36.0–46.0)
HCT: 28.9 % — ABNORMAL LOW (ref 36.0–46.0)
Hemoglobin: 10.1 g/dL — ABNORMAL LOW (ref 12.0–15.0)
Hemoglobin: 9 g/dL — ABNORMAL LOW (ref 12.0–15.0)
MCH: 29.2 pg (ref 26.0–34.0)
MCH: 28.2 pg (ref 26.0–34.0)
MCHC: 32.1 g/dL (ref 30.0–36.0)
MCHC: 31.1 g/dL (ref 30.0–36.0)
MCV: 91 fL (ref 78.0–100.0)
MCV: 90.6 fL (ref 78.0–100.0)
Platelets: 272 10*3/uL (ref 150–400)
Platelets: 211 10*3/uL (ref 150–400)
RBC: 3.46 MIL/uL — ABNORMAL LOW (ref 3.87–5.11)
RBC: 3.19 MIL/uL — ABNORMAL LOW (ref 3.87–5.11)
RDW: 14.5 % (ref 11.5–15.5)
RDW: 14.1 % (ref 11.5–15.5)
WBC: 11.2 10*3/uL — ABNORMAL HIGH (ref 4.0–10.5)
WBC: 10.3 10*3/uL (ref 4.0–10.5)

## 2017-06-20 LAB — COMPREHENSIVE METABOLIC PANEL
ALT: 315 U/L — ABNORMAL HIGH (ref 14–54)
AST: 225 U/L — ABNORMAL HIGH (ref 15–41)
Albumin: 3.1 g/dL — ABNORMAL LOW (ref 3.5–5.0)
Alkaline Phosphatase: 351 U/L — ABNORMAL HIGH (ref 38–126)
Anion gap: 9 (ref 5–15)
BUN: 20 mg/dL (ref 6–20)
CO2: 28 mmol/L (ref 22–32)
Calcium: 8.3 mg/dL — ABNORMAL LOW (ref 8.9–10.3)
Chloride: 97 mmol/L — ABNORMAL LOW (ref 101–111)
Creatinine, Ser: 1.28 mg/dL — ABNORMAL HIGH (ref 0.44–1.00)
GFR calc Af Amer: 47 mL/min — ABNORMAL LOW (ref >60)
GFR calc non Af Amer: 41 mL/min — ABNORMAL LOW (ref >60)
Glucose, Bld: 146 mg/dL — ABNORMAL HIGH (ref 65–99)
Potassium: 3.9 mmol/L (ref 3.5–5.1)
Sodium: 134 mmol/L — ABNORMAL LOW (ref 135–145)
Total Bilirubin: 0.8 mg/dL (ref 0.3–1.2)
Total Protein: 6.7 g/dL (ref 6.5–8.1)

## 2017-06-20 LAB — BASIC METABOLIC PANEL
Anion gap: 8 (ref 5–15)
BUN: 18 mg/dL (ref 6–20)
CO2: 26 mmol/L (ref 22–32)
Calcium: 8.1 mg/dL — ABNORMAL LOW (ref 8.9–10.3)
Chloride: 102 mmol/L (ref 101–111)
Creatinine, Ser: 1.08 mg/dL — ABNORMAL HIGH (ref 0.44–1.00)
GFR calc Af Amer: 58 mL/min — ABNORMAL LOW (ref >60)
GFR calc non Af Amer: 50 mL/min — ABNORMAL LOW (ref >60)
Glucose, Bld: 134 mg/dL — ABNORMAL HIGH (ref 65–99)
Potassium: 4.1 mmol/L (ref 3.5–5.1)
Sodium: 136 mmol/L (ref 135–145)

## 2017-06-20 LAB — DIFFERENTIAL
Band Neutrophils: 0 %
Basophils Absolute: 0 10*3/uL (ref 0.0–0.1)
Basophils Relative: 0 %
Blasts: 0 %
Eosinophils Absolute: 0 10*3/uL (ref 0.0–0.7)
Eosinophils Relative: 0 %
Lymphocytes Relative: 7 %
Lymphs Abs: 0.8 10*3/uL (ref 0.7–4.0)
Metamyelocytes Relative: 0 %
Monocytes Absolute: 1 10*3/uL (ref 0.1–1.0)
Monocytes Relative: 9 %
Myelocytes: 0 %
Neutro Abs: 9.4 10*3/uL — ABNORMAL HIGH (ref 1.7–7.7)
Neutrophils Relative %: 84 %
Other: 0 %
Promyelocytes Absolute: 0 %
nRBC: 0 /100 WBC

## 2017-06-20 LAB — APTT: aPTT: 69 seconds — ABNORMAL HIGH (ref 24–36)

## 2017-06-20 LAB — ACETAMINOPHEN LEVEL: Acetaminophen (Tylenol), Serum: <10 ug/mL — ABNORMAL LOW (ref 10–30)

## 2017-06-20 LAB — PROTIME-INR
INR: 2.28
Prothrombin Time: 24.9 seconds — ABNORMAL HIGH (ref 11.4–15.2)

## 2017-06-20 LAB — CORTISOL-AM, BLOOD: Cortisol - AM: 10.9 ug/dL (ref 6.7–22.6)

## 2017-06-20 LAB — CREATININE, URINE, RANDOM: Creatinine, Urine: 67.82 mg/dL

## 2017-06-20 LAB — PROCALCITONIN: Procalcitonin: 0.7 ng/mL

## 2017-06-20 LAB — BRAIN NATRIURETIC PEPTIDE: B Natriuretic Peptide: 174.8 pg/mL — ABNORMAL HIGH (ref 0.0–100.0)

## 2017-06-20 MED ORDER — DIPHENHYDRAMINE HCL 50 MG/ML IJ SOLN
25.0000 mg | Freq: Once | INTRAMUSCULAR | Status: AC
  Administered 2017-06-20: 25 mg via INTRAVENOUS
  Filled 2017-06-20: qty 1

## 2017-06-20 MED ORDER — ONDANSETRON HCL 4 MG/2ML IJ SOLN
4.0000 mg | Freq: Three times a day (TID) | INTRAMUSCULAR | Status: DC | PRN
  Administered 2017-06-21 – 2017-06-30 (×12): 4 mg via INTRAVENOUS
  Filled 2017-06-20 (×12): qty 2

## 2017-06-20 MED ORDER — ZOLPIDEM TARTRATE 5 MG PO TABS
5.0000 mg | ORAL_TABLET | Freq: Every evening | ORAL | Status: DC | PRN
  Administered 2017-06-20 – 2017-07-01 (×12): 5 mg via ORAL
  Filled 2017-06-20 (×13): qty 1

## 2017-06-20 MED ORDER — SENNOSIDES-DOCUSATE SODIUM 8.6-50 MG PO TABS: 1.0000 | ORAL_TABLET | Freq: Every evening | ORAL | Status: DC | PRN

## 2017-06-20 MED ORDER — TIOTROPIUM BROMIDE MONOHYDRATE 18 MCG IN CAPS
1.0000 | ORAL_CAPSULE | Freq: Every day | RESPIRATORY_TRACT | Status: DC
  Administered 2017-06-20 – 2017-06-30 (×3): 18 ug via RESPIRATORY_TRACT
  Filled 2017-06-20: qty 5

## 2017-06-20 MED ORDER — APIXABAN 5 MG PO TABS
5.0000 mg | ORAL_TABLET | Freq: Two times a day (BID) | ORAL | Status: DC
  Administered 2017-06-20 – 2017-06-21 (×3): 5 mg via ORAL
  Filled 2017-06-20 (×4): qty 1

## 2017-06-20 MED ORDER — ACETAMINOPHEN 500 MG PO TABS
1000.0000 mg | ORAL_TABLET | Freq: Once | ORAL | Status: DC
  Filled 2017-06-20: qty 2

## 2017-06-20 MED ORDER — KETOROLAC TROMETHAMINE 15 MG/ML IJ SOLN: 15.0000 mg | Freq: Once | INTRAMUSCULAR | Status: DC

## 2017-06-20 MED ORDER — SODIUM CHLORIDE 0.9 % IV SOLN: INTRAVENOUS | Status: DC

## 2017-06-20 MED ORDER — PANTOPRAZOLE SODIUM 40 MG PO TBEC
40.0000 mg | DELAYED_RELEASE_TABLET | Freq: Two times a day (BID) | ORAL | Status: DC
  Administered 2017-06-20 – 2017-07-01 (×21): 40 mg via ORAL
  Filled 2017-06-20 (×7): qty 1
  Filled 2017-06-20: qty 2
  Filled 2017-06-20 (×15): qty 1

## 2017-06-20 MED ORDER — TIZANIDINE HCL 4 MG PO TABS
4.0000 mg | ORAL_TABLET | Freq: Three times a day (TID) | ORAL | Status: DC | PRN
  Administered 2017-06-26 – 2017-06-27 (×3): 4 mg via ORAL
  Filled 2017-06-20 (×3): qty 1

## 2017-06-20 MED ORDER — SODIUM CHLORIDE 0.9 % IV SOLN
INTRAVENOUS | Status: DC
  Administered 2017-06-20 – 2017-06-28 (×13): via INTRAVENOUS

## 2017-06-20 MED ORDER — DEXAMETHASONE SODIUM PHOSPHATE 10 MG/ML IJ SOLN
10.0000 mg | Freq: Once | INTRAMUSCULAR | Status: AC
  Administered 2017-06-20: 10 mg via INTRAVENOUS
  Filled 2017-06-20 (×2): qty 1

## 2017-06-20 MED ORDER — ONDANSETRON HCL 4 MG/2ML IJ SOLN
4.0000 mg | Freq: Once | INTRAMUSCULAR | Status: AC
  Administered 2017-06-20: 4 mg via INTRAVENOUS
  Filled 2017-06-20: qty 2

## 2017-06-20 MED ORDER — OXYCODONE HCL 5 MG PO TABS
5.0000 mg | ORAL_TABLET | Freq: Four times a day (QID) | ORAL | Status: DC | PRN
  Administered 2017-06-20 – 2017-06-22 (×6): 5 mg via ORAL
  Filled 2017-06-20 (×8): qty 1

## 2017-06-20 MED ORDER — SODIUM CHLORIDE 0.9 % IV BOLUS
1000.0000 mL | Freq: Once | INTRAVENOUS | Status: AC
  Administered 2017-06-20: 1000 mL via INTRAVENOUS

## 2017-06-20 MED ORDER — ALBUTEROL SULFATE (2.5 MG/3ML) 0.083% IN NEBU: 2.5000 mg | INHALATION_SOLUTION | RESPIRATORY_TRACT | Status: DC | PRN

## 2017-06-20 MED ORDER — GABAPENTIN 600 MG PO TABS
600.0000 mg | ORAL_TABLET | Freq: Three times a day (TID) | ORAL | Status: DC
  Administered 2017-06-20 – 2017-06-29 (×25): 600 mg via ORAL
  Filled 2017-06-20 (×27): qty 1

## 2017-06-20 MED ORDER — HYDROCORTISONE NA SUCCINATE PF 100 MG IJ SOLR
50.0000 mg | Freq: Once | INTRAMUSCULAR | Status: AC
  Administered 2017-06-20: 50 mg via INTRAVENOUS
  Filled 2017-06-20: qty 2

## 2017-06-20 MED ORDER — NITROGLYCERIN 0.4 MG SL SUBL: 0.4000 mg | SUBLINGUAL_TABLET | SUBLINGUAL | Status: DC | PRN

## 2017-06-20 NOTE — ED Notes (Signed)
Report called to rn on 3e 

## 2017-06-20 NOTE — ED Notes (Signed)
Pt pass ed her swallow screen 

## 2017-06-20 NOTE — Progress Notes (Addendum)
Initial Nutrition Assessment  DOCUMENTATION CODES:   Not applicable  INTERVENTION:  Rd to monitor PO intake as pt declined any supplementation.    NUTRITION DIAGNOSIS:   Increased nutrient needs related to chronic illness as evidenced by estimated needs.  GOAL:   Patient will meet greater than or equal to 90% of their needs  MONITOR:   PO intake, Diet advancement, Weight trends, Labs, I & O's  REASON FOR ASSESSMENT:   Consult Assessment of nutrition requirement/status  ASSESSMENT:   72 y.o. F admitted for weakness and lightheadedness due to possible stroke with recent TIA and UTI. PMH of hypertension, hyperlipidemia, COPD, bronchiectasis, GERD, a.fib, CHF, CAD, pacemaker, left bundle blockage, gastric uler, GI bleeding, anemia, psoriasis, abnormal liver function. PSH of appendectomy, PEG removal with complications, SI surgery (resection of small bowel, gastrostomy), and colectomy.    Spoke with pt about nutrition history and weight status. Pt reports that her weight has been the same since at least 1990 and that she has always been this small. She reports that she eats "whatever I want to" and that she has a good appetite. She reports that she doesn't eat breakfast, but she will have a whole sandwich for lunch with some chips and a lean meat, starch, and vegetable for dinner; sometimes she will have some popcorn at night as a snack. Pt reports that she generally tries to eat healthy and watches what she eats due to Hx of bowel obstructions; now she has only a couple a year. During the fare-ups she will not eat for a couple of days until she has a BM which is usually loose, at this time she will start introducing liquids before resuming her normal eating habits (typically a week without eating from start to finish). Concerns of possible food fear due to recurrent bowel obstructions.   Pt reports that she has had bowel obstructions since the 1980's and has had multiple resections; 5  resections to her SI (unknown length) and colon resection (now 8 inches long). Discussed that with her intestines resected and COPD her nutrient needs are increased and suggested a supplement, pt declined.  Medications reviewed: neurontin, protonix, 0.9% sodium chloride IV 65ml/hr  Labs reviewed: BG 134 (H), creatinine 1.08 (H), GFR 50 (L), RBC 3.19 (L), hemoglobin 9 (L), HCT 28.9 (L),  04/02/17: iron 17 (L), transferrin 171 (L)   Intake/Output Summary (Last 24 hours) at 06/20/2017 1231 Last data filed at 06/20/2017 0600 Gross per 24 hour  Intake 1000 ml  Output -  Net 1000 ml     NUTRITION - FOCUSED PHYSICAL EXAM:    Most Recent Value  Orbital Region  No depletion  Upper Arm Region  Mild depletion  Thoracic and Lumbar Region  No depletion  Buccal Region  No depletion  Temple Region  Mild depletion  Clavicle Bone Region  No depletion  Clavicle and Acromion Bone Region  Mild depletion  Scapular Bone Region  Unable to assess  Dorsal Hand  Mild depletion  Patellar Region  Mild depletion  Anterior Thigh Region  Mild depletion  Posterior Calf Region  Mild depletion  Edema (RD Assessment)  None  Hair  Reviewed  Eyes  Reviewed  Mouth  Reviewed  Skin  Reviewed  Nails  Reviewed       Diet Order:  Diet Heart Room service appropriate? Yes; Fluid consistency: Thin  EDUCATION NEEDS:   Education needs have been addressed(Discussed with pt that her nutrient needs are increased due to her multiple intestinal  resections and COPD. )  Skin:  Skin Assessment: Reviewed RN Assessment  Last BM:  unknown  Height:   Ht Readings from Last 1 Encounters:  06/20/17 5' (1.524 m)    Weight:   Wt Readings from Last 1 Encounters:  06/20/17 102 lb 4.7 oz (46.4 kg)   UBW: 97 - 103 lbs % UBW: 100%  Ideal Body Weight:  45.45 kg  BMI:  Body mass index is 19.98 kg/m.  Estimated Nutritional Needs:   Kcal:  1250-1350 kcal  Protein:  60-70 grams  Fluid:  > 1.2 L   Hope Budds,  Dietetic Intern

## 2017-06-20 NOTE — Discharge Instructions (Addendum)
[facility]      [facility] °[phone] ° °OPEN ABDOMINAL SURGERY: POST OP INSTRUCTIONS ° °Always review your discharge instruction sheet given to you by the facility where your surgery was performed. ° °IF YOU HAVE DISABILITY OR FAMILY LEAVE FORMS, YOU MUST BRING THEM TO THE OFFICE FOR PROCESSING.  PLEASE DO NOT GIVE THEM TO YOUR DOCTOR. ° °1. A prescription for pain medication may be given to you upon discharge.  Take your pain medication as prescribed, if needed.  If narcotic pain medicine is not needed, then you may take acetaminophen (Tylenol) or ibuprofen (Advil) as needed. °2. Take your usually prescribed medications unless otherwise directed. °3. If you need a refill on your pain medication, please contact your pharmacy. They will contact our office to request authorization.  Prescriptions will not be filled after 5pm or on week-ends. °4. You should follow a light diet the first few days after arrival home, such as soup and crackers, pudding, etc.unless your doctor has advised otherwise. A high-fiber, low fat diet can be resumed as tolerated.   Be sure to include lots of fluids daily. Most patients will experience some swelling and bruising on the chest and neck area.  Ice packs will help.  Swelling and bruising can take several days to resolve °5. Most patients will experience some swelling and bruising in the area of the incision. Ice pack will help. Swelling and bruising can take several days to resolve..  °6. It is common to experience some constipation if taking pain medication after surgery.  Increasing fluid intake and taking a stool softener will usually help or prevent this problem from occurring.  A mild laxative (Milk of Magnesia or Miralax) should be taken according to package directions if there are no bowel movements after 48 hours. °7.  You may have steri-strips (small skin tapes) in place directly over the incision.  These strips should be left on the skin for 7-10 days.  If your  surgeon used skin glue on the incision, you may shower in 24 hours.  The glue will flake off over the next 2-3 weeks.  Any sutures or staples will be removed at the office during your follow-up visit. You may find that a light gauze bandage over your incision may keep your staples from being rubbed or pulled. You may shower and replace the bandage daily. °8. ACTIVITIES:  You may resume regular (light) daily activities beginning the next day--such as daily self-care, walking, climbing stairs--gradually increasing activities as tolerated.  You may have sexual intercourse when it is comfortable.  Refrain from any heavy lifting or straining until approved by your doctor. °a. You may drive when you no longer are taking prescription pain medication, you can comfortably wear a seatbelt, and you can safely maneuver your car and apply brakes °b. Return to Work: ___________________________________ °9. You should see your doctor in the office for a follow-up appointment approximately two weeks after your surgery.  Make sure that you call for this appointment within a day or two after you arrive home to insure a convenient appointment time. °OTHER INSTRUCTIONS:  °_____________________________________________________________ °_____________________________________________________________ ° °WHEN TO CALL YOUR DOCTOR: °1. Fever over 101.0 °2. Inability to urinate °3. Nausea and/or vomiting °4. Extreme swelling or bruising °5. Continued bleeding from incision. °6. Increased pain, redness, or drainage from the incision. °7. Difficulty swallowing or breathing °8. Muscle cramping or spasms. °9. Numbness or tingling in hands or feet or around lips. ° °The clinic staff is available to   answer your questions during regular business hours.  Please dont hesitate to call and ask to speak to one of the nurses if you have concerns.  For further questions, please visit [email or web address]    Information on my medicine -  ELIQUIS (apixaban)  This medication education was reviewed with me or my healthcare representative as part of my discharge preparation.  The pharmacist that spoke with me during my hospital stay was:  Saundra Shelling, [facility]  Why was Eliquis prescribed for you? Eliquis was prescribed for you to reduce the risk of a blood clot forming that can cause a stroke if you have a medical condition called atrial fibrillation (a type of irregular heartbeat).  What do You need to know about Eliquis ? Take your Eliquis TWICE DAILY - one tablet in the morning and one tablet in the evening with or without food. If you have difficulty swallowing the tablet whole please discuss with your pharmacist how to take the medication safely.  Take Eliquis exactly as prescribed by your doctor and DO NOT stop taking Eliquis without talking to the doctor who prescribed the medication.  Stopping may increase your risk of developing a stroke.  Refill your prescription before you run out.  After discharge, you should have regular check-up appointments with your healthcare provider that is prescribing your Eliquis.  In the future your dose may need to be changed if your kidney function or weight changes by a significant amount or as you get older.  What do you do if you miss a dose? If you miss a dose, take it as soon as you remember on the same day and resume taking twice daily.  Do not take more than one dose of ELIQUIS at the same time to make up a missed dose.  Important Safety Information A possible side effect of Eliquis is bleeding. You should call your healthcare provider right away if you experience any of the following: ? Bleeding from an injury or your nose that does not stop. ? Unusual colored urine (red or dark brown) or unusual colored stools (red or black). ? Unusual bruising for unknown reasons. ? A serious fall or if you hit your head (even if there is no bleeding).  Some medicines may interact with  Eliquis and might increase your risk of bleeding or clotting while on Eliquis. To help avoid this, consult your healthcare provider or pharmacist prior to using any new prescription or non-prescription medications, including herbals, vitamins, non-steroidal anti-inflammatory drugs (NSAIDs) and supplements.  This website has more information on Eliquis (apixaban): http://www.eliquis.com/eliquis/home

## 2017-06-20 NOTE — Progress Notes (Signed)
OT Cancellation Note  Patient Details Name: [name] MRN: [id number] DOB: 1945/05/27   Cancelled Treatment:    Reason Eval/Treat Not Completed: OT screened, per PT, no needs identified, will sign off.  [name] 06/20/2017, 9:25 AM  [name] OTR/L [phone]

## 2017-06-20 NOTE — Evaluation (Signed)
Physical Therapy Evaluation & Discharge Patient Details Name: [name] MRN: [id number] DOB: [birth year] Today's Date: 06/20/2017   History of Present Illness  Pt is a 72 y.o. female admitted 06/19/17 with c/o weakness and lightheadedness. CT negative for acute intracranial abnormality. Worked up for weakness likely due to hypotension, requiring fluids. PMH includes HTN, COPD, pacemaker, CHF, a-fib, anemia. Recent admission 3/14 with workup for TIA.    Clinical Impression  Patient evaluated by Physical Therapy with no further acute PT needs identified. PTA, pt indep with mobility and lives with family. Today, pt able to ambulate with single UE support and supervision; mod indep with ADLs in room. Pt's baseline on 3L O2 [place]. No acute neurological symptoms or focal deficits noted. All education has been completed and the patient has no further questions. PT is signing off. Thank you for this referral.  Supine BP 152/70 Sitting BP 143/94 Post-amb BP 142/76    Follow Up Recommendations No PT follow up;Supervision - Intermittent    Equipment Recommendations  None recommended by PT    Recommendations for Other Services       Precautions / Restrictions Precautions Precautions: Fall Restrictions Weight Bearing Restrictions: No      Mobility  Bed Mobility Overal bed mobility: Independent                Transfers Overall transfer level: Independent Equipment used: None Transfers: Sit to/from Stand              Ambulation/Gait Ambulation/Gait assistance: Supervision Ambulation Distance (Feet): 150 Feet Assistive device: None(IV pole) Gait Pattern/deviations: Step-through pattern;Decreased step length - left Gait velocity: Decreased Gait velocity interpretation: Below normal speed for age/gender General Gait Details: Amb in room with no DME, then asking to hold onto IV pole. Amb in hallway with single UE support on IV pole; no overt instability. Supervision for  safety  Stairs            Wheelchair Mobility    Modified Rankin (Stroke Patients Only)       Balance Overall balance assessment: Mild deficits observed, not formally tested Sitting-balance support: Feet supported;No upper extremity supported Sitting balance-Leahy Scale: Good     Standing balance support: During functional activity;Single extremity supported;No upper extremity supported Standing balance-Leahy Scale: Fair Standing balance comment: Mod indep with single UE support using toilet and washing hands at sink                             Pertinent Vitals/Pain Pain Assessment: No/denies pain    Home Living Family/patient expects to be discharged to:: Private residence Living Arrangements: Spouse/significant other;Children Available Help at Discharge: Family;Available 24 hours/day Type of Home: House Home Access: Stairs to enter   [employer] of Steps: threshold Home Layout: One level Home Equipment: [place] - 2 wheels;Cane - single point;Shower seat Additional Comments: Has two 29 year old children at home    Prior Function Level of Independence: Independent         Comments: does not typically use the cane or walker at baseline unless she is sick.      Hand Dominance        Extremity/Trunk Assessment   Upper Extremity Assessment Upper Extremity Assessment: Overall WFL for tasks assessed    Lower Extremity Assessment Lower Extremity Assessment: Overall WFL for tasks assessed    Cervical / Trunk Assessment Cervical / Trunk Assessment: Kyphotic  Communication   Communication: No difficulties  Cognition  Arousal/Alertness: Awake/alert Behavior During Therapy: WFL for tasks assessed/performed Overall Cognitive Status: Within Functional Limits for tasks assessed                                        General Comments      Exercises     Assessment/Plan    PT Assessment Patent does not need any  further PT services  PT Problem List         PT Treatment Interventions      PT Goals (Current goals can be found in the Care Plan section)  Acute Rehab PT Goals PT Goal Formulation: All assessment and education complete, DC therapy    Frequency     Barriers to discharge        Co-evaluation               AM-PAC PT "6 Clicks" Daily Activity  Outcome Measure Difficulty turning over in bed (including adjusting bedclothes, sheets and blankets)?: None Difficulty moving from lying on back to sitting on the side of the bed? : None Difficulty sitting down on and standing up from a chair with arms (e.g., wheelchair, bedside commode, etc,.)?: A Little Help needed moving to and from a bed to chair (including a wheelchair)?: A Little Help needed walking in hospital room?: A Little Help needed climbing 3-5 steps with a railing? : A Little 6 Click Score: 20    End of Session Equipment Utilized During Treatment: Gait belt;Oxygen(3L O2 [place] (baseline)) Activity Tolerance: Patient tolerated treatment well Patient left: in bed;with call bell/phone within reach Nurse Communication: Mobility status PT Visit Diagnosis: Muscle weakness (generalized) (M62.81);Difficulty in walking, not elsewhere classified (R26.2)    Time: 4174-0814 PT Time Calculation (min) (ACUTE ONLY): 21 min   Charges:   PT Evaluation $PT Eval Moderate Complexity: 1 Mod     PT G Codes:       Mabeline Caras, PT, DPT Acute Rehab Services  Pager: [place] 06/20/2017, 9:25 AM

## 2017-06-20 NOTE — Consult Note (Signed)
Reason for Consult:gallstone  Referring Physician: Teryl Lucy MD  [name] is an 72 y.o. female.  HPI: Asked to see patient at the request of Dr Teryl Lucy for elevated liver function studies and gallstone. Pt admitted to hospital for weakness.  On Eloquis for TIA.  She denies hx of abdominal pain nausea vomiting or feeding intolerance.  She has a hx of chronic CBD dilation. U/S shows large gallstone and dilated CBD.  She is pain free and eating diner.    Past Medical History:  Diagnosis Date  . Anemia   . Anginal pain ([place])   . Arthritis    right hip  . Atrial fibrillation ([place])   . Blood transfusion   . CAD (coronary artery disease)    mild disease per cath in 2011  . CHF (congestive heart failure) ([place])   . Chronic abdominal pain   . Chronic nausea   . Chronic pain syndrome   . COPD (chronic obstructive pulmonary disease) (H[facility])    continuous O2- 2l  . Diverticulitis   . Elbow fracture, left aug 2012  . Elevated liver enzymes   . Fibromyalgia    COUPLE OF TIMES A YEAR-PT HAS EPISODES OF CONFUSION-USUALLY INVOLVES DAY/NIGHT REVERSAL AND EPISODES OF TWITCHING AND FALLS--SEES DR. Jacelyn Grip - NEUROLOGIST-LAST SEEN 07/10/11  . Gastroparesis   . GERD (gastroesophageal reflux disease)   . H/O: GI bleed    from Pradaxa  . Hepatitis    hx of in high school   . HTN (hypertension)    under control; has been on med. x "years"  . Hx MRSA infection   . Hx of gastric ulcer   . Hx of stress fx aug 2011   right hip   . LBBB (left bundle branch block)   . Oxygen dependent    2 liters via nasal cannula at all times  . Pacemaker    CRT therapy; followed by Dr. Caryl Comes  . Pleural effusion     s/p right thoracentesis 03/09  . Pneumonia - 03/09, 12/09, 02/10, 06/10   MOST RECENT FEB 2013  . Primary dilated cardiomyopathy (H[facility])    EF 45 to 50% per echo in Jan 2012  . RLS (restless legs syndrome)   . Silent aspiration   . Small bowel obstruction ([place])   . Urinary incontinence     -INTERSTIM  IMPLANT NOT FUNCTIONING PER PT  . Venous embolism and thrombosis of subclavian vein ([place])    after pacemaker insertion Oct 2011  . Vitamin B12 deficiency     Past Surgical History:  Procedure Laterality Date  . APPENDECTOMY    . CARDIAC CATHETERIZATION  04/08/2006, 11/16/2009  . COLECTOMY     Intestinal resection (5times)  . COLECTOMY  02/04/2000   ex. lap., intra-abd. subtotal colectomy with ileosigmoid colon anastomosies and lysis of adhesions  . CYSTOSCOPY  11/11/2008  . CYSTOSCOPY W/ RETROGRADES  11/11/2008   right  . CYSTOSCOPY WITH INJECTION  04/30/2006   transurethral collagen injection; incision vaginal stenosis  . ESOPHAGOGASTRODUODENOSCOPY N/A 02/02/2013   Procedure: ESOPHAGOGASTRODUODENOSCOPY (EGD);  Surgeon: Lafayette Dragon, MD;  Location: [facility];  Service: Endoscopy;  Laterality: N/A;  . EXPLORATORY LAPAROTOMY  04/27/2009   lysis of adhesions, gastrostomy tube  . GASTROCUTANEOUS FISTULA CLOSURE    . INTERSTIM IMPLANT PLACEMENT  05/28/2006 - stage I   06/05/2006 - stage II  . INTERSTIM IMPLANT PLACEMENT  02/05/2012   Procedure: [name] IMPLANT FIRST STAGE;  Surgeon: Reece Packer, MD;  Location: [facility];  Service: Urology;  Laterality: N/A;  Replacement of Interstim Lead     . INTERSTIM IMPLANT REVISION  03/06/2011   Procedure: REVISION OF [name];  Surgeon: Reece Packer, MD;  Location: [facility];  Service: Urology;  Laterality: N/A;  Replacement of Neurostimulator  . INTERSTIM IMPLANT REVISION  10/23/2007  . PACEMAKER PLACEMENT  12/21/2009  . Peg removed     with complications  . PORT-A-CATH REMOVAL  07/27/2011   Procedure: REMOVAL PORT-A-CATH;  Surgeon: Rolm Bookbinder, MD;  Location: [facility];  Service: General;  Laterality: N/A;  . PORTACATH PLACEMENT  12/16/2009  . SAVORY DILATION N/A 02/02/2013   Procedure: SAVORY DILATION;  Surgeon: Lafayette Dragon, MD;  Location: [facility];  Service: Endoscopy;  Laterality: N/A;  . SMALL INTESTINE  SURGERY  05/20/2001   ex. lap., resection of small bowel stricture; gastrostomy; insertion central line  . TONSILLECTOMY  age 23  . TOTAL ABDOMINAL HYSTERECTOMY     complete  . TOTAL HIP ARTHROPLASTY  08/03/2011   Procedure: TOTAL HIP ARTHROPLASTY ANTERIOR APPROACH;  Surgeon: Mcarthur Rossetti, MD;  Location: [facility];  Service: Orthopedics;  Laterality: Right;    Family History  Problem Relation Age of Onset  . Heart disease Father   . Stroke Mother   . Heart disease Mother   . Heart disease Brother   . Breast cancer Maternal Aunt   . Heart disease Paternal Aunt   . Diabetes Paternal Grandfather   . Colon cancer Neg Hx     Social History:  reports that she quit smoking about 31 years ago. She has a 20.00 pack-year smoking history. She has never used smokeless tobacco. She reports that she does not drink alcohol or use drugs.  Allergies:  Allergies  Allergen Reactions  . Dabigatran Etexilate Mesylate Other (See Comments)    INTERNAL BLEEDING-pradaxa  . Talwin [Pentazocine] Other (See Comments)    hallucinations  . Lactate Rash    Medications: I have reviewed the patient's current medications.  Results for orders placed or performed during the hospital encounter of 06/19/17 (from the past 48 hour(s))  Protime-INR     Status: Abnormal   Collection Time: 06/19/17 11:15 PM  Result Value Ref Range   Prothrombin Time 24.9 (H) 11.4 - 15.2 seconds   INR 2.28     Comment: Performed at [facility] [address], [place], [place] [address]  APTT     Status: Abnormal   Collection Time: 06/19/17 11:15 PM  Result Value Ref Range   aPTT 69 (H) 24 - 36 seconds    Comment:        IF BASELINE aPTT IS ELEVATED, SUGGEST PATIENT RISK ASSESSMENT BE USED TO DETERMINE APPROPRIATE ANTICOAGULANT THERAPY. Performed at [facility], [place] [address], [place], [place] [address]   CBC     Status: Abnormal   Collection Time: 06/19/17 11:15 PM  Result Value Ref Range   WBC 11.2  (H) 4.0 - 10.5 K/uL   RBC 3.46 (L) 3.87 - 5.11 MIL/uL   Hemoglobin 10.1 (L) 12.0 - 15.0 g/dL   HCT 31.5 (L) 36.0 - 46.0 %   MCV 91.0 78.0 - 100.0 fL   MCH 29.2 26.0 - 34.0 pg   MCHC 32.1 30.0 - 36.0 g/dL   RDW 14.5 11.5 - 15.5 %   Platelets 272 150 - 400 K/uL    Comment: Performed at [facility], [place] [address], [place], [place] [address]  Differential     Status: Abnormal   Collection Time: 06/19/17 11:15 PM  Result Value Ref Range   Neutrophils Relative % 84 %   Lymphocytes Relative 7 %   Monocytes Relative 9 %   Eosinophils Relative 0 %   Basophils Relative 0 %   Band Neutrophils 0 %   Metamyelocytes Relative 0 %   Myelocytes 0 %   Promyelocytes Absolute 0 %   Blasts 0 %   nRBC 0 0 /100 WBC   Other 0 %   Neutro Abs 9.4 (H) 1.7 - 7.7 K/uL   Lymphs Abs 0.8 0.7 - 4.0 K/uL   Monocytes Absolute 1.0 0.1 - 1.0 K/uL   Eosinophils Absolute 0.0 0.0 - 0.7 K/uL   Basophils Absolute 0.0 0.0 - 0.1 K/uL   Smear Review MORPHOLOGY UNREMARKABLE     Comment: Performed at [facility], [place] [address], [place], [place] [address]  Comprehensive metabolic panel     Status: Abnormal   Collection Time: 06/19/17 11:15 PM  Result Value Ref Range   Sodium 134 (L) 135 - 145 mmol/L   Potassium 3.9 3.5 - 5.1 mmol/L   Chloride 97 (L) 101 - 111 mmol/L   CO2 28 22 - 32 mmol/L   Glucose, Bld 146 (H) 65 - 99 mg/dL   BUN 20 6 - 20 mg/dL   Creatinine, Ser 1.28 (H) 0.44 - 1.00 mg/dL   Calcium 8.3 (L) 8.9 - 10.3 mg/dL   Total Protein 6.7 6.5 - 8.1 g/dL   Albumin 3.1 (L) 3.5 - 5.0 g/dL   AST 225 (H) 15 - 41 U/L   ALT 315 (H) 14 - 54 U/L   Alkaline Phosphatase 351 (H) 38 - 126 U/L   Total Bilirubin 0.8 0.3 - 1.2 mg/dL   GFR calc non Af Amer 41 (L) >60 mL/min   GFR calc Af Amer 47 (L) >60 mL/min    Comment: (NOTE) The eGFR has been calculated using the CKD EPI equation. This calculation has not been validated in all clinical situations. eGFR's persistently <60 mL/min signify possible  Chronic Kidney Disease.    Anion gap 9 5 - 15    Comment: Performed at [facility] [address] [place], [place] [address]  I-stat troponin, ED     Status: None   Collection Time: 06/19/17 11:34 PM  Result Value Ref Range   Troponin i, poc 0.00 0.00 - 0.08 ng/mL   Comment 3            Comment: Due to the release kinetics of cTnI, a negative result within the first hours of the onset of symptoms does not rule out myocardial infarction with certainty. If myocardial infarction is still suspected, repeat the test at appropriate intervals.   I-Stat Chem 8, ED     Status: Abnormal   Collection Time: 06/19/17 11:36 PM  Result Value Ref Range   Sodium 137 135 - 145 mmol/L   Potassium 3.9 3.5 - 5.1 mmol/L   Chloride 95 (L) 101 - 111 mmol/L   BUN 20 6 - 20 mg/dL   Creatinine, Ser 1.30 (H) 0.44 - 1.00 mg/dL   Glucose, Bld 147 (H) 65 - 99 mg/dL   Calcium, Ion 1.07 (L) 1.15 - 1.40 mmol/L   TCO2 30 22 - 32 mmol/L   Hemoglobin 10.5 (L) 12.0 - 15.0 g/dL   HCT 31.0 (L) 36.0 - 46.0 %  Urinalysis, Routine w reflex microscopic  Status: None   Collection Time: 06/20/17 12:51 AM  Result Value Ref Range   Color, Urine YELLOW YELLOW   APPearance CLEAR CLEAR   Specific Gravity, Urine 1.015 1.005 - 1.030   pH 6.0 5.0 - 8.0   Glucose, UA NEGATIVE NEGATIVE mg/dL   Hgb urine dipstick NEGATIVE NEGATIVE   Bilirubin Urine NEGATIVE NEGATIVE   Ketones, ur NEGATIVE NEGATIVE mg/dL   Protein, ur NEGATIVE NEGATIVE mg/dL   Nitrite NEGATIVE NEGATIVE   Leukocytes, UA NEGATIVE NEGATIVE    Comment: Performed at [facility] [address], [place], [place] [address]  Creatinine, urine, random     Status: None   Collection Time: 06/20/17  3:20 AM  Result Value Ref Range   Creatinine, Urine 67.82 mg/dL    Comment: Performed at [facility] [address] [place], [place] [address]  Cortisol-am, blood     Status: None   Collection Time: 06/20/17  3:51 AM  Result Value Ref Range    Cortisol - AM 10.9 6.7 - 22.6 ug/dL    Comment: Performed at [facility], [place] [address] [place], [place] [address]  Brain natriuretic peptide     Status: Abnormal   Collection Time: 06/20/17  3:51 AM  Result Value Ref Range   B Natriuretic Peptide 174.8 (H) 0.0 - 100.0 pg/mL    Comment: Performed at [facility] [address] [place],  [address]  Procalcitonin     Status: None   Collection Time: 06/20/17  3:51 AM  Result Value Ref Range   Procalcitonin 0.70 ng/mL    Comment:        Interpretation: PCT > 0.5 ng/mL and <= 2 ng/mL: Systemic infection (sepsis) is possible, but other conditions are known to elevate PCT as well. (NOTE)       Sepsis PCT Algorithm           Lower Respiratory Tract                                      Infection PCT Algorithm    ----------------------------     ----------------------------         PCT < 0.25 ng/mL                PCT < 0.10 ng/mL         Strongly encourage             Strongly discourage   discontinuation of antibiotics    initiation of antibiotics    ----------------------------     -----------------------------       PCT 0.25 - 0.50 ng/mL            PCT 0.10 - 0.25 ng/mL               OR       >80% decrease in PCT            Discourage initiation of                                            antibiotics      Encourage discontinuation           of antibiotics    ----------------------------     -----------------------------  PCT >= 0.50 ng/mL              PCT 0.26 - 0.50 ng/mL                AND       <80% decrease in PCT             Encourage initiation of                                             antibiotics       Encourage continuation           of antibiotics    ----------------------------     -----------------------------        PCT >= 0.50 ng/mL                  PCT > 0.50 ng/mL               AND         increase in PCT                  Strongly encourage                                       initiation of antibiotics    Strongly encourage escalation           of antibiotics                                     -----------------------------                                           PCT <= 0.25 ng/mL                                                 OR                                        > 80% decrease in PCT                                     Discontinue / Do not initiate                                             antibiotics Performed at [facility], [address] [place], [place] [address]   Basic metabolic panel     Status: Abnormal   Collection Time: 06/20/17  3:51 AM  Result Value Ref Range   Sodium 136 135 - 145 mmol/L   Potassium 4.1 3.5 - 5.1 mmol/L   Chloride 102 101 - 111 mmol/L   CO2 26  22 - 32 mmol/L   Glucose, Bld 134 (H) 65 - 99 mg/dL   BUN 18 6 - 20 mg/dL   Creatinine, Ser 1.08 (H) 0.44 - 1.00 mg/dL   Calcium 8.1 (L) 8.9 - 10.3 mg/dL   GFR calc non Af Amer 50 (L) >60 mL/min   GFR calc Af Amer 58 (L) >60 mL/min    Comment: (NOTE) The eGFR has been calculated using the CKD EPI equation. This calculation has not been validated in all clinical situations. eGFR's persistently <60 mL/min signify possible Chronic Kidney Disease.    Anion gap 8 5 - 15    Comment: Performed at [facility] [address], [place], [place] [address]  CBC     Status: Abnormal   Collection Time: 06/20/17  3:51 AM  Result Value Ref Range   WBC 10.3 4.0 - 10.5 K/uL   RBC 3.19 (L) 3.87 - 5.11 MIL/uL   Hemoglobin 9.0 (L) 12.0 - 15.0 g/dL   HCT 28.9 (L) 36.0 - 46.0 %   MCV 90.6 78.0 - 100.0 fL   MCH 28.2 26.0 - 34.0 pg   MCHC 31.1 30.0 - 36.0 g/dL   RDW 14.1 11.5 - 15.5 %   Platelets 211 150 - 400 K/uL    Comment: Performed at [facility], [place] [address] [place], [place] [address]  Acetaminophen level     Status: Abnormal   Collection Time: 06/20/17  3:51 AM  Result Value Ref Range   Acetaminophen (Tylenol), Serum <10 (L) 10 - 30 ug/mL    Comment:         THERAPEUTIC CONCENTRATIONS VARY SIGNIFICANTLY. A RANGE OF 10-30 ug/mL MAY BE AN EFFECTIVE CONCENTRATION FOR MANY PATIENTS. HOWEVER, SOME ARE BEST TREATED AT CONCENTRATIONS OUTSIDE THIS RANGE. ACETAMINOPHEN CONCENTRATIONS >150 ug/mL AT 4 HOURS AFTER INGESTION AND >50 ug/mL AT 12 HOURS AFTER INGESTION ARE OFTEN ASSOCIATED WITH TOXIC REACTIONS. Performed at [facility], [place] [address], [place], [place] [address]    *Note: Due to a large number of results and/or encounters for the requested time period, some results have not been displayed. A complete set of results can be found in Results Review.    Ct Head Wo Contrast  Result Date: 06/20/2017 CLINICAL DATA:  Headache, trouble walking weakness EXAM: CT HEAD WITHOUT CONTRAST TECHNIQUE: Contiguous axial images were obtained from the base of the skull through the vertex without intravenous contrast. COMPARISON:  05/31/2017, 06/04/2011 FINDINGS: Brain: No acute territorial infarction, hemorrhage or intracranial mass is visualized. Stable ventricle size. Stable calcified left frontal dural nodule. Mild small vessel ischemic changes of the white matter. Vascular: No hyperdense vessels. Scattered calcifications at the carotid siphons Skull: Normal. Negative for fracture or focal lesion. Sinuses/Orbits: Retention cysts in the left sphenoid sinus. Mild mucosal thickening in the ethmoid sinuses. No acute orbital abnormality. Other: None IMPRESSION: 1. No CT evidence for acute intracranial abnormality. 2. Mild atrophy and small vessel ischemic changes of the white matter. Electronically Signed   By: Donavan Foil M.D.   On: 06/20/2017 00:14   Dg Chest Portable 1 View  Result Date: 06/20/2017 CLINICAL DATA:  Hypotension EXAM: PORTABLE CHEST 1 VIEW COMPARISON:  03/21/2017 FINDINGS: Unchanged position of left chest wall 3 lead AICD leads. Chronic interstitial opacities are unchanged. No new area of consolidation. No pleural effusion or  pneumothorax. IMPRESSION: Unchanged appearance of chronic interstitial opacities, likely secondary to chronic mycobacterial infection. Electronically Signed   By: Cletus Gash.D.  On: 06/20/2017 01:15   US Abdomen Limited Ruq  Result Date: 06/20/2017 CLINICAL DATA:  Elevated LFTs EXAM: ULTRASOUND ABDOMEN LIMITED RIGHT UPPER QUADRANT COMPARISON:  None. FINDINGS: Gallbladder: Large echogenic focus is noted in well distended gallbladder. Mild posterior acoustical shadowing is noted in this may represent a prominent 3 cm gallstone. Possibility of a tumefactive sludge ball would deserve consideration as well. Mild gallbladder sludge is noted. No wall thickening or pericholecystic fluid is noted. Common bile duct: Diameter: 9.6 mm. This is slightly prominent for the patient's given age and associated with mild intrahepatic ductal dilatation. Liver: No focal mass lesion is noted. Mild biliary ductal dilatation is noted. Portal vein is patent on color Doppler imaging with normal direction of blood flow towards the liver. IMPRESSION: Changes of biliary dilatation of both the intrahepatic and extrahepatic tree. This raises suspicion for distal common bile duct stone. MRCP or ERCP may be helpful for further evaluation. Gallbladder sludge and large echogenic focus within the gallbladder. This may represent a focus of tumefactive sludge or prominent gallstone. The degree of posterior acoustical shadowing is somewhat decreased Electronically Signed   By: Inez Catalina M.D.   On: 06/20/2017 15:15    Review of Systems  Constitutional: Negative for chills and fever.  HENT: Negative for hearing loss.   Eyes: Negative for blurred vision and double vision.  Respiratory: Negative for cough and hemoptysis.   Cardiovascular: Negative for chest pain and palpitations.  Gastrointestinal: Negative for abdominal pain, nausea and vomiting.  Genitourinary: Positive for dysuria and hematuria.  Musculoskeletal: Positive for  myalgias and neck pain.  Skin: Negative for itching and rash.  Neurological: Positive for weakness. Negative for seizures.  Endo/Heme/Allergies: Bruises/bleeds easily.  Psychiatric/Behavioral: Negative for depression and suicidal ideas.   Blood pressure 140/69, pulse 78, temperature 98 F (36.7 C), temperature source Oral, resp. rate 15, height 5' (1.524 m), weight 46.4 kg (102 lb 4.7 oz), SpO2 100 %. Physical Exam  Constitutional: She is oriented to person, place, and time. She appears well-developed and well-nourished.  HENT:  Head: Normocephalic and atraumatic.  Eyes: Pupils are equal, round, and reactive to light. EOM are normal. No scleral icterus.  Neck: Normal range of motion. Neck supple.  Cardiovascular: Normal rate and regular rhythm.  Respiratory: Effort normal and breath sounds normal.  GI: Soft. Bowel sounds are normal.  Mild TTP RUQ no Murphy's sign   Musculoskeletal: Normal range of motion.  Neurological: She is alert and oriented to person, place, and time.  Skin: Skin is warm and dry.  Psychiatric: She has a normal mood and affect. Her behavior is normal.    Assessment/Plan: Abnormal LFT Gallstone- no hx of biliary colic and negative Murphy's sign.   Check HIDA No signs of acute cholecystitis at this point  No hx of recurrent attacks  DOW service to follow up   [facility] 06/20/2017, 6:26 PM

## 2017-06-20 NOTE — ED Notes (Signed)
Admitting doctor at the bedside 

## 2017-06-20 NOTE — Progress Notes (Signed)
Patient seen and examined at bedside, patient admitted after midnight, please see earlier detailed admission note by Ivor Costa, MD. Briefly, patient presented with weakness and hypotension. Blood pressure improved with IV fluids. Headache this morning. Will give Decadron, Benadryl. No red flags. RUQ ultrasound for evaluation of elevated AST/ALT/Alkphos.   Cordelia Poche, MD [facility] 06/20/2017, 4:20 PM Pager: [phone]

## 2017-06-20 NOTE — ED Notes (Signed)
C/o a headache and backache

## 2017-06-20 NOTE — ED Provider Notes (Signed)
TIME SEEN: 12:49 AM  CHIEF COMPLAINT: Generalized weakness  HPI: Patient is a 72 year old female with history of hypertension, COPD, diastolic heart failure, atrial fibrillation who presents to the emergency department generalized weakness today.  States she has felt lightheaded and generally unwell.  No chest pain or shortness of breath.  No fevers, cough, vomiting or diarrhea.  No bright red blood per rectum or melena.  Was just admitted to the hospital recently for possible TIA where she had right-sided weakness.  States that she felt like her right-sided weakness was getting worse today.  She does have a pacemaker and cannot have an MRI.  She is on 3 L of oxygen chronically.  She is on Coreg for her blood pressure and states that there has not been any medication changes and she took her last dose at 8:30 PM.  She was started on Eliquis and Lipitor during her last admission.  ROS: See HPI Constitutional: no fever  Eyes: no drainage  ENT: no runny nose   Cardiovascular:  no chest pain  Resp: no SOB  GI: no vomiting GU: no dysuria Integumentary: no rash  Allergy: no hives  Musculoskeletal: no leg swelling  Neurological: no slurred speech ROS otherwise negative  PAST MEDICAL HISTORY/PAST SURGICAL HISTORY:  Past Medical History:  Diagnosis Date  . Anemia   . Anginal pain ([place])   . Arthritis    right hip  . Atrial fibrillation ([place])   . Blood transfusion   . CAD (coronary artery disease)    mild disease per cath in 2011  . CHF (congestive heart failure) ([place])   . Chronic abdominal pain   . Chronic nausea   . Chronic pain syndrome   . COPD (chronic obstructive pulmonary disease) ([facility])    continuous O2- 2l  . Diverticulitis   . Elbow fracture, left aug 2012  . Elevated liver enzymes   . Fibromyalgia    COUPLE OF TIMES A YEAR-PT HAS EPISODES OF CONFUSION-USUALLY INVOLVES DAY/NIGHT REVERSAL AND EPISODES OF TWITCHING AND FALLS--SEES DR. Jacelyn Grip - NEUROLOGIST-LAST SEEN 07/10/11  .  Gastroparesis   . GERD (gastroesophageal reflux disease)   . H/O: GI bleed    from Pradaxa  . Hepatitis    hx of in high school   . HTN (hypertension)    under control; has been on med. x "years"  . Hx MRSA infection   . Hx of gastric ulcer   . Hx of stress fx aug 2011   right hip   . LBBB (left bundle branch block)   . Oxygen dependent    2 liters via nasal cannula at all times  . Pacemaker    CRT therapy; followed by Dr. Caryl Comes  . Pleural effusion     s/p right thoracentesis 03/09  . Pneumonia - 03/09, 12/09, 02/10, 06/10   MOST RECENT FEB 2013  . Primary dilated cardiomyopathy ([facility])    EF 45 to 50% per echo in Jan 2012  . RLS (restless legs syndrome)   . Silent aspiration   . Small bowel obstruction ([place])   . Urinary incontinence     -INTERSTIM IMPLANT NOT FUNCTIONING PER PT  . Venous embolism and thrombosis of subclavian vein ([place])    after pacemaker insertion Oct 2011  . Vitamin B12 deficiency     MEDICATIONS:  Prior to Admission medications   Medication Sig Start Date End Date Taking? Authorizing Provider  apixaban (ELIQUIS) 5 MG TABS tablet Take 1 tablet (5 mg total) by mouth  2 (two) times daily. 06/06/17   Ann Held, DO  atorvastatin (LIPITOR) 20 MG tablet Take 1 tablet (20 mg total) by mouth daily at 6 PM. 06/06/17   Carollee Herter, Alferd Apa, DO  carvedilol (COREG) 25 MG tablet TAKE 1 TABLET BY MOUTH TWICE DAILY Patient taking differently: TAKE 1 TABLET (25mg ) BY MOUTH TWICE DAILY 01/31/17   Minus Breeding, MD  furosemide (LASIX) 40 MG tablet TAKE ONE TABLET BY MOUTH IN THE MORNING Patient taking differently: TAKE ONE TABLET (40mg ) BY MOUTH IN THE MORNING 11/27/16   Minus Breeding, MD  gabapentin (NEURONTIN) 600 MG tablet Take 1 tablet (600 mg total) by mouth 3 (three) times daily. 06/01/17   Aline August, MD  nitroGLYCERIN (NITROSTAT) 0.4 MG SL tablet Place 1 tablet (0.4 mg total) under the tongue every 5 (five) minutes as needed. CHEST PAIN Patient not  taking: Reported on 06/07/2017 05/15/17   Deboraha Sprang, MD  oxyCODONE-acetaminophen (PERCOCET) 10-325 MG tablet Take 1 tablet by mouth every 4 (four) hours as needed. 05/30/17   Ann Held, DO  pantoprazole (PROTONIX) 40 MG tablet Take 1 tablet (40 mg total) by mouth 2 (two) times daily. 06/01/17   Aline August, MD  promethazine (PHENERGAN) 25 MG tablet Take 1 tablet (25 mg total) by mouth every 8 (eight) hours as needed. for nausea 02/21/16   Gatha Mayer, MD  Tiotropium Bromide Monohydrate (SPIRIVA RESPIMAT) 1.25 MCG/ACT AERS Inhale 2 puffs into the lungs daily.    [provider]  tiZANidine (ZANAFLEX) 4 MG tablet Take 1 tablet (4 mg total) by mouth 3 (three) times daily as needed. for muscle spams 05/30/17   Carollee Herter, Alferd Apa, DO  zolpidem (AMBIEN) 5 MG tablet TAKE 1 TABLET BY MOUTH ONCE DAILY AT BEDTIME AS NEEDED FOR  SLEEP Patient taking differently: Take 5 mg by mouth at bedtime as needed for sleep.  03/07/17   Ann Held, DO  iron dextran complex (INFED) 50 MG/ML injection Please give infed infusion (no test dose needed) over 4 hours per pharmacy calculated dose. Ht: 5'2 Wt: 99 pounds Hgb: 12 12/20/10 06/05/11  Lafayette Dragon, MD    ALLERGIES:  Allergies  Allergen Reactions  . Dabigatran Etexilate Mesylate Other (See Comments)    INTERNAL BLEEDING-pradaxa  . Talwin [Pentazocine] Other (See Comments)    hallucinations  . Lactate Rash    SOCIAL HISTORY:  Social History   Tobacco Use  . Smoking status: Former Smoker    Packs/day: 1.00    Years: 20.00    Pack years: 20.00    Last attempt to quit: 03/19/1986    Years since quitting: 31.2  . Smokeless tobacco: Never Used  Substance Use Topics  . Alcohol use: No    Alcohol/week: 0.0 oz    FAMILY HISTORY: Family History  Problem Relation Age of Onset  . Heart disease Father   . Stroke Mother   . Heart disease Mother   . Heart disease Brother   . Breast cancer Maternal Aunt   . Heart  disease Paternal Aunt   . Diabetes Paternal Grandfather   . Colon cancer Neg Hx     EXAM: BP (!) 83/48   Pulse 70   Temp 98 F (36.7 C) (Oral)   Resp 17   Ht 5' (1.524 m)   Wt 44.9 kg (99 lb)   SpO2 98%   BMI 19.33 kg/m  CONSTITUTIONAL: Alert and oriented and responds appropriately to questions.  Elderly, afebrile, in no distress HEAD: Normocephalic EYES: Conjunctivae clear, pupils appear equal, EOMI ENT: normal nose; moist mucous membranes NECK: Supple, no meningismus, no nuchal rigidity, no LAD  CARD: RRR; S1 and S2 appreciated; no murmurs, no clicks, no rubs, no gallops RESP: Normal chest excursion without splinting or tachypnea; breath sounds clear and equal bilaterally; no wheezes, no rhonchi, no rales, no hypoxia or respiratory distress, speaking full sentences ABD/GI: Normal bowel sounds; non-distended; soft, non-tender, no rebound, no guarding, no peritoneal signs, no hepatosplenomegaly BACK:  The back appears normal and is non-tender to palpation, there is no CVA tenderness EXT: Normal ROM in all joints; non-tender to palpation; no edema; normal capillary refill; no cyanosis, no calf tenderness or swelling    SKIN: Normal color for age and race; warm; no rash NEURO: Moves all extremities equally, strength 5/5 in all 4 extremities, cranial nerves II to XII intact, normal speech, normal sensation diffusely PSYCH: The patient's mood and manner are appropriate. Grooming and personal hygiene are appropriate.  MEDICAL DECISION MAKING: Patient here with generalized weakness.  Reports she felt like she may be having another TIA given she felt she had right to the weakness.  She has normal neurologic exam here but is hypotensive.  No infectious symptoms.  No chest pain.  No hemorrhage.  No changes in her blood pressure medication.  Will hydrate patient.  Labs obtained have been unremarkable other than mildly elevated creatinine of 1.3.  Hemoglobin is 10 which is baseline for her.   Troponin is negative.  Will obtain chest x-ray and urinalysis but doubt that this is infectious in nature.  I feel she will need admission.  ED PROGRESS: Blood pressure improving with IV hydration.  Chest x-ray shows no acute abnormality.  Urine is still pending.  Will discuss with medicine for admission.  Discussed patient's case with hospitalist, Dr. Blaine Hamper.  I have recommended admission and patient (and family if present) agree with this plan. Admitting physician will place admission orders.   I reviewed all nursing notes, vitals, pertinent previous records, EKGs, lab and urine results, imaging (as available).     EKG Interpretation  Date/Time:  Wednesday June 19 2017 23:10:40 EDT Ventricular Rate:  66 PR Interval:  142 QRS Duration: 116 QT Interval:  468 QTC Calculation: 490 R Axis:   81 Text Interpretation:  Atrial-sensed ventricular-paced rhythm Abnormal ECG No significant change since last tracing Confirmed by Timberly Yott, Cyril Mourning [phone]) on 06/20/2017 12:50:10 AM        CRITICAL CARE Performed by: Cyril Mourning Alexandru Moorer   Total critical care time: 40 minutes  Critical care time was exclusive of separately billable procedures and treating other patients.  Critical care was necessary to treat or prevent imminent or life-threatening deterioration.  Critical care was time spent personally by me on the following activities: development of treatment plan with patient and/or surrogate as well as nursing, discussions with consultants, evaluation of patient's response to treatment, examination of patient, obtaining history from patient or surrogate, ordering and performing treatments and interventions, ordering and review of laboratory studies, ordering and review of radiographic studies, pulse oximetry and re-evaluation of patient's condition.    Pasqualina Colasurdo, Delice Bison, DO 06/20/17 [phone]

## 2017-06-20 NOTE — H&P (Signed)
History and Physical    [name] ERD:[id number] DOB: 03-Mar-1946 DOA: 06/19/2017  Referring MD/NP/PA:   PCP: Ann Held, DO   Patient coming from:  The patient is coming from home.  At baseline, pt is independent for most of ADL.   Chief Complaint: weakness, lightheadedness  HPI: [name] is a 72 y.o. female with medical history significant of hypertension, hyperlipidemia, COPD, bronchiectasis, GERD, atrial fibrillation on Eliquis, pacemaker placement, left bundle blockage, gastric ulcer, GI bleeding, dCHF, anemia, psoriasis, abnormal liver function, who presents with weakness, lightheadedness.  Pt was recently hospitalized from 03/14-/16 due to UTI and a TIA. Patient had TIA workup. CAT of head and neck showed age indeterminated occluded left vertebral artery from origin to C3 level. CT of head showed subcentimeter left frontal dural calcification which may be a dural plaque or tiny meningioma. MRI of brain could not be done due to Presents of pacemaker. 2-D echo showed EF of 55-60 percent with grade 1 diastolic dysfunction. Pt was d/c'ed on Eliquis for A fib.   Pt states that she has weakness again today. She has frontal headache, mild lightheadedness and dizziness. She dropped things in right hand. Currently she denies unilateral weakness, numbness or tingling in extremities. No facial droop, slurred speech, vision change, hearing loss, difficulty swallowing. Patient has nausea, but no vomiting, diarrhea or abdominal pain. Denies symptoms of UTI. Patient has mild SOB, but no chest pain, cough, fever or chills. Patient was found to have hypotension initially, with blood pressure 73/47, which improved to SBP>100 after 1 L normal saline bolus.  ED Course: pt was found to have WBC 11.2, INR 2.28, acute renal injury with creatinine 1.30, BUN 20,  abnormal liver function with ALP 351, AST 225, ALT 315, total bilirubin 0.8, temperature normal, no tachycardia, no tachypnea, oxygen  saturation 97% on room air. CT head is negative for acute intracranial abnormalities. Patient is placed on telemetry bed for observation.  # CXR showed unchanged appearance of chronic interstitial opacities, likely secondary to chronic mycobacterial infection.  Review of Systems:   General: no fevers, chills, no body weight gain, has fatigue HEENT: no blurry vision, hearing changes or sore throat Respiratory: has dyspnea, no coughing, wheezing CV: no chest pain, no palpitations GI: has nausea, no vomiting, abdominal pain, diarrhea, constipation GU: no dysuria, burning on urination, increased urinary frequency, hematuria  Ext: no leg edema Neuro: no unilateral weakness, numbness, or tingling, no vision change or hearing loss Skin: no rash, no skin tear. MSK: No muscle spasm, no deformity, no limitation of range of movement in spin Heme: No easy bruising.  Travel history: No recent long distant travel.  Allergy:  Allergies  Allergen Reactions  . Dabigatran Etexilate Mesylate Other (See Comments)    INTERNAL BLEEDING-pradaxa  . Talwin [Pentazocine] Other (See Comments)    hallucinations  . Lactate Rash    Past Medical History:  Diagnosis Date  . Anemia   . Anginal pain ([place])   . Arthritis    right hip  . Atrial fibrillation ([place])   . Blood transfusion   . CAD (coronary artery disease)    mild disease per cath in 2011  . CHF (congestive heart failure) ([place])   . Chronic abdominal pain   . Chronic nausea   . Chronic pain syndrome   . COPD (chronic obstructive pulmonary disease) (HCC)    continuous O2- 2l  . Diverticulitis   . Elbow fracture, left aug 2012  . Elevated liver  enzymes   . Fibromyalgia    COUPLE OF TIMES A YEAR-PT HAS EPISODES OF CONFUSION-USUALLY INVOLVES DAY/NIGHT REVERSAL AND EPISODES OF TWITCHING AND FALLS--SEES DR. Jacelyn Grip - NEUROLOGIST-LAST SEEN 07/10/11  . Gastroparesis   . GERD (gastroesophageal reflux disease)   . H/O: GI bleed    from Pradaxa  .  Hepatitis    hx of in high school   . HTN (hypertension)    under control; has been on med. x "years"  . Hx MRSA infection   . Hx of gastric ulcer   . Hx of stress fx aug 2011   right hip   . LBBB (left bundle branch block)   . Oxygen dependent    2 liters via nasal cannula at all times  . Pacemaker    CRT therapy; followed by Dr. Caryl Comes  . Pleural effusion     s/p right thoracentesis 03/09  . Pneumonia - 03/09, 12/09, 02/10, 06/10   MOST RECENT FEB 2013  . Primary dilated cardiomyopathy (HCC)    EF 45 to 50% per echo in Jan 2012  . RLS (restless legs syndrome)   . Silent aspiration   . Small bowel obstruction ([place])   . Urinary incontinence     -INTERSTIM IMPLANT NOT FUNCTIONING PER PT  . Venous embolism and thrombosis of subclavian vein ([place])    after pacemaker insertion Oct 2011  . Vitamin B12 deficiency     Past Surgical History:  Procedure Laterality Date  . APPENDECTOMY    . CARDIAC CATHETERIZATION  04/08/2006, 11/16/2009  . COLECTOMY     Intestinal resection (5times)  . COLECTOMY  02/04/2000   ex. lap., intra-abd. subtotal colectomy with ileosigmoid colon anastomosies and lysis of adhesions  . CYSTOSCOPY  11/11/2008  . CYSTOSCOPY W/ RETROGRADES  11/11/2008   right  . CYSTOSCOPY WITH INJECTION  04/30/2006   transurethral collagen injection; incision vaginal stenosis  . ESOPHAGOGASTRODUODENOSCOPY N/A 02/02/2013   Procedure: ESOPHAGOGASTRODUODENOSCOPY (EGD);  Surgeon: Lafayette Dragon, MD;  Location: [facility];  Service: Endoscopy;  Laterality: N/A;  . EXPLORATORY LAPAROTOMY  04/27/2009   lysis of adhesions, gastrostomy tube  . GASTROCUTANEOUS FISTULA CLOSURE    . INTERSTIM IMPLANT PLACEMENT  05/28/2006 - stage I   06/05/2006 - stage II  . INTERSTIM IMPLANT PLACEMENT  02/05/2012   Procedure: [name] IMPLANT FIRST STAGE;  Surgeon: Reece Packer, MD;  Location: [facility];  Service: Urology;  Laterality: N/A;  Replacement of Interstim Lead     . INTERSTIM IMPLANT  REVISION  03/06/2011   Procedure: REVISION OF [name];  Surgeon: Reece Packer, MD;  Location: [facility];  Service: Urology;  Laterality: N/A;  Replacement of Neurostimulator  . INTERSTIM IMPLANT REVISION  10/23/2007  . PACEMAKER PLACEMENT  12/21/2009  . Peg removed     with complications  . PORT-A-CATH REMOVAL  07/27/2011   Procedure: REMOVAL PORT-A-CATH;  Surgeon: Rolm Bookbinder, MD;  Location: [facility];  Service: General;  Laterality: N/A;  . PORTACATH PLACEMENT  12/16/2009  . SAVORY DILATION N/A 02/02/2013   Procedure: SAVORY DILATION;  Surgeon: Lafayette Dragon, MD;  Location: [facility];  Service: Endoscopy;  Laterality: N/A;  . SMALL INTESTINE SURGERY  05/20/2001   ex. lap., resection of small bowel stricture; gastrostomy; insertion central line  . TONSILLECTOMY  age 56  . TOTAL ABDOMINAL HYSTERECTOMY     complete  . TOTAL HIP ARTHROPLASTY  08/03/2011   Procedure: TOTAL HIP ARTHROPLASTY ANTERIOR APPROACH;  Surgeon: Mcarthur Rossetti, MD;  Location: [facility];  Service: Orthopedics;  Laterality: Right;    Social History:  reports that she quit smoking about 31 years ago. She has a 20.00 pack-year smoking history. She has never used smokeless tobacco. She reports that she does not drink alcohol or use drugs.  Family History:  Family History  Problem Relation Age of Onset  . Heart disease Father   . Stroke Mother   . Heart disease Mother   . Heart disease Brother   . Breast cancer Maternal Aunt   . Heart disease Paternal Aunt   . Diabetes Paternal Grandfather   . Colon cancer Neg Hx      Prior to Admission medications   Medication Sig Start Date End Date Taking? Authorizing Provider  apixaban (ELIQUIS) 5 MG TABS tablet Take 1 tablet (5 mg total) by mouth 2 (two) times daily. 06/06/17  Yes Roma Schanz R, DO  atorvastatin (LIPITOR) 20 MG tablet Take 1 tablet (20 mg total) by mouth daily at 6 PM. 06/06/17  Yes Lowne Chase, Yvonne R, DO  carvedilol (COREG)  25 MG tablet TAKE 1 TABLET BY MOUTH TWICE DAILY Patient taking differently: TAKE 1 TABLET (25mg ) BY MOUTH TWICE DAILY 01/31/17  Yes Minus Breeding, MD  furosemide (LASIX) 40 MG tablet TAKE ONE TABLET BY MOUTH IN THE MORNING Patient taking differently: TAKE ONE TABLET (40mg ) BY MOUTH IN THE MORNING 11/27/16  Yes Minus Breeding, MD  gabapentin (NEURONTIN) 600 MG tablet Take 1 tablet (600 mg total) by mouth 3 (three) times daily. 06/01/17  Yes Aline August, MD  nitroGLYCERIN (NITROSTAT) 0.4 MG SL tablet Place 1 tablet (0.4 mg total) under the tongue every 5 (five) minutes as needed. CHEST PAIN 05/15/17  Yes Deboraha Sprang, MD  oxyCODONE-acetaminophen (PERCOCET) 10-325 MG tablet Take 1 tablet by mouth every 4 (four) hours as needed. Patient taking differently: Take 1 tablet by mouth every 4 (four) hours as needed for pain.  05/30/17  Yes Roma Schanz R, DO  pantoprazole (PROTONIX) 40 MG tablet Take 1 tablet (40 mg total) by mouth 2 (two) times daily. 06/01/17  Yes Aline August, MD  promethazine (PHENERGAN) 25 MG tablet Take 1 tablet (25 mg total) by mouth every 8 (eight) hours as needed. for nausea 02/21/16  Yes Gatha Mayer, MD  Tiotropium Bromide Monohydrate (SPIRIVA RESPIMAT) 1.25 MCG/ACT AERS Inhale 2 puffs into the lungs daily.   Yes [provider]  tiZANidine (ZANAFLEX) 4 MG tablet Take 1 tablet (4 mg total) by mouth 3 (three) times daily as needed. for muscle spams 05/30/17  Yes Lowne Lyndal Pulley R, DO  zolpidem (AMBIEN) 5 MG tablet TAKE 1 TABLET BY MOUTH ONCE DAILY AT BEDTIME AS NEEDED FOR  SLEEP Patient taking differently: Take 5 mg by mouth at bedtime as needed for sleep.  03/07/17  Yes Roma Schanz R, DO  iron dextran complex (INFED) 50 MG/ML injection Please give infed infusion (no test dose needed) over 4 hours per pharmacy calculated dose. Ht: 5'2 Wt: 99 pounds Hgb: 12 12/20/10 06/05/11  Lafayette Dragon, MD    Physical Exam: Vitals:   06/20/17 0200 06/20/17  0230 06/20/17 0245 06/20/17 0315  BP: (!) 103/54 126/61 (!) 119/58 (!) 106/59  Pulse: 68 76 78 79  Resp: (!) 8 13 18 18   Temp:      TempSrc:      SpO2: 97% 99% 98% 97%  Weight:      Height:       General:  Not in acute distress HEENT:       Eyes: PERRL, EOMI, no scleral icterus.       ENT: No discharge from the ears and nose, no pharynx injection, no tonsillar enlargement.        Neck: No JVD, no bruit, no mass felt. Heme: No neck lymph node enlargement. Cardiac: S1/S2, RRR, No murmurs, No gallops or rubs. Respiratory: has fine crackles in right side of lung posteriorly. GI: Soft, nondistended, nontender, no rebound pain, no organomegaly, BS present. GU: No hematuria Ext: No pitting leg edema bilaterally. 2+DP/PT pulse bilaterally. Musculoskeletal: No joint deformities, No joint redness or warmth, no limitation of ROM in spin. Skin: No rashes.  Neuro: Alert, oriented X3, cranial nerves II-XII grossly intact, moves all extremities normally. Muscle strength 5/5 in all extremities, sensation to light touch intact. Brachial reflex 2+ bilaterally. Negative Babinski's sign.  Psych: Patient is not psychotic, no suicidal or hemocidal ideation.  Labs on Admission: I have personally reviewed following labs and imaging studies  CBC: Recent Labs  Lab 06/19/17 2315 06/19/17 2336 06/20/17 0351  WBC 11.2*  --  10.3  NEUTROABS 9.4*  --   --   HGB 10.1* 10.5* 9.0*  HCT 31.5* 31.0* 28.9*  MCV 91.0  --  90.6  PLT 272  --  974   Basic Metabolic Panel: Recent Labs  Lab 06/19/17 2315 06/19/17 2336  NA 134* 137  K 3.9 3.9  CL 97* 95*  CO2 28  --   GLUCOSE 146* 147*  BUN 20 20  CREATININE 1.28* 1.30*  CALCIUM 8.3*  --    GFR: Estimated Creatinine Clearance: 27.7 mL/min (A) (by C-G formula based on SCr of 1.3 mg/dL (H)). Liver Function Tests: Recent Labs  Lab 06/19/17 2315  AST 225*  ALT 315*  ALKPHOS 351*  BILITOT 0.8  PROT 6.7  ALBUMIN 3.1*   No results for input(s):  LIPASE, AMYLASE in the last 168 hours. No results for input(s): AMMONIA in the last 168 hours. Coagulation Profile: Recent Labs  Lab 06/19/17 2315  INR 2.28   Cardiac Enzymes: No results for input(s): CKTOTAL, CKMB, CKMBINDEX, TROPONINI in the last 168 hours. BNP (last 3 results) No results for input(s): PROBNP in the last 8760 hours. HbA1C: No results for input(s): HGBA1C in the last 72 hours. CBG: No results for input(s): GLUCAP in the last 168 hours. Lipid Profile: No results for input(s): CHOL, HDL, LDLCALC, TRIG, CHOLHDL, LDLDIRECT in the last 72 hours. Thyroid Function Tests: No results for input(s): TSH, T4TOTAL, FREET4, T3FREE, THYROIDAB in the last 72 hours. Anemia Panel: No results for input(s): VITAMINB12, FOLATE, FERRITIN, TIBC, IRON, RETICCTPCT in the last 72 hours. Urine analysis:    Component Value Date/Time   COLORURINE YELLOW 05/30/2017 1940   APPEARANCEUR CLOUDY (A) 05/30/2017 1940   LABSPEC <1.005 (L) 05/30/2017 1940   PHURINE 6.0 05/30/2017 1940   GLUCOSEU NEGATIVE 05/30/2017 1940   GLUCOSEU NEGATIVE 07/24/2011 0908   HGBUR TRACE (A) 05/30/2017 1940   HGBUR negative 05/19/2010 1609   BILIRUBINUR NEGATIVE 05/30/2017 1940   BILIRUBINUR neg 03/07/2017 1224   KETONESUR NEGATIVE 05/30/2017 1940   PROTEINUR NEGATIVE 05/30/2017 1940   UROBILINOGEN 0.2 03/07/2017 1224   UROBILINOGEN 0.2 05/17/2014 1217   NITRITE POSITIVE (A) 05/30/2017 1940   LEUKOCYTESUR MODERATE (A) 05/30/2017 1940   Sepsis Labs: @LABRCNTIP (procalcitonin:4,lacticidven:4) )No results found for this or any previous visit (from the past 240 hour(s)).   Radiological Exams on Admission: Ct Head Wo Contrast  Result  Date: 06/20/2017 CLINICAL DATA:  Headache, trouble walking weakness EXAM: CT HEAD WITHOUT CONTRAST TECHNIQUE: Contiguous axial images were obtained from the base of the skull through the vertex without intravenous contrast. COMPARISON:  05/31/2017, 06/04/2011 FINDINGS: Brain: No  acute territorial infarction, hemorrhage or intracranial mass is visualized. Stable ventricle size. Stable calcified left frontal dural nodule. Mild small vessel ischemic changes of the white matter. Vascular: No hyperdense vessels. Scattered calcifications at the carotid siphons Skull: Normal. Negative for fracture or focal lesion. Sinuses/Orbits: Retention cysts in the left sphenoid sinus. Mild mucosal thickening in the ethmoid sinuses. No acute orbital abnormality. Other: None IMPRESSION: 1. No CT evidence for acute intracranial abnormality. 2. Mild atrophy and small vessel ischemic changes of the white matter. Electronically Signed   By: Donavan Foil M.D.   On: 06/20/2017 00:14   Dg Chest Portable 1 View  Result Date: 06/20/2017 CLINICAL DATA:  Hypotension EXAM: PORTABLE CHEST 1 VIEW COMPARISON:  03/21/2017 FINDINGS: Unchanged position of left chest wall 3 lead AICD leads. Chronic interstitial opacities are unchanged. No new area of consolidation. No pleural effusion or pneumothorax. IMPRESSION: Unchanged appearance of chronic interstitial opacities, likely secondary to chronic mycobacterial infection. Electronically Signed   By: Ulyses Jarred M.D.   On: 06/20/2017 01:15     EKG: Independently reviewed.  QTC 490, paced rhythm  Assessment/Plan Principal Problem:   Weakness Active Problems:   ANEMIA-IRON DEFICIENCY   Essential hypertension   Atrial fibrillation/flutter   COPD with emphysema (HCC)   GERD (gastroesophageal reflux disease)   Chronic diastolic CHF (congestive heart failure) (HCC)   TIA (transient ischemic attack)   AKI (acute kidney injury) (HCC)   Abnormal LFTs   Hypotension   HLD (hyperlipidemia)   CAD (coronary artery disease)   Protein-calorie malnutrition, severe (HCC)   Weakness: Etiology is not clear. Initially patient was suspected to have TIA, but she does not focal neurologic findings on physical examination.CT head is negative for acute intracranial  abnormalities. Patient had recent TIA workup, will not repeat. Her weakness is likely due to hypotension, which needs to be worked up.  -will place on tele bed for obs -IVF  -pt/ot -work up for hypotension as below  Hypotension: etiology is not clear. Patient has mild leukocytosis, but no fever. Denies symptoms of UTI. CXR showed unchanged appearance of chronic interstitial opacities, which is a chronic issue. Pt has been f/u with Pulmonologist, Dr. Collie Siad. It was concerned for chronic mycobacterial infection, but recently her AFB was negative. Will need to rule out UTI, bacteremia, adrenal insufficiency. Pt's Bp responded to IVF quickly. After 1L NS, Bp is up to SBP>100, indicating possible dehydration. -IVF: 1L NS, then 75 cc/h -hold blood pressure medications and diuretics: Coreg and Lasix -f/u UA, Bx and Ux -give 50 mg of solucortef -cortisol level  ANEMIA-IRON DEFICIENCY: Hgb stable. Hgb 10.5 -f/u by CBC  Essential hypertension: now has hypotension -hold Coreg and Lasix  Atrial Fibrillation: CHA2DS2-VASc Scoreis 6. Heart rate were controlled. -continue Eliquis -hold  Coreg due to hypotension  COPD with emphysema:stable. -spiriva inhaler and prn albuterol nebs  Chronic diastolic CHF (congestive heart failure):2-D echo on 05/1617 showed EF 55-60 % with grade 1 diastolic dysfunction. Patient does not have leg edema. No respiratory distress. CHF is compensated. -hold lasix -check BNP  AKI: Likely due to prerenal secondary to dehydration and continuation of diuretics, and ATN is also possible due to hypotension - IVF as above - Check FeUrea - Follow up renal function by BMP -  Hold laxix  Abnormal LFTs: mildtransaminase elevation. Pt had abnormal LFT in previous admission, which has resolved at d/c. Patient had negative hepatitis panel. Etiology is not clear. -check tylenol level -hold lipitor  HLD: -hold lipitor due to abnormal LFT.   GERD: -Protonix  TIA (transient  ischemic attack): -on Eliquis for a fib  CAD (coronary artery disease): no CP -observe  Protein-calorie malnutrition, severe: -consult nutrition    DVT ppx: on Eliquis Code Status: Full code Family Communication:  Yes, patient's husband at bed side Disposition Plan:  Anticipate discharge back to previous home environment Consults called:  none Admission status: Obs / tele     Date of Service 06/20/2017    Ivor Costa [facility] Pager [phone]  If 7PM-7AM, please contact night-coverage [email or web address] Password [facility] 06/20/2017, 4:24 AM

## 2017-06-21 ENCOUNTER — Inpatient Hospital Stay (HOSPITAL_COMMUNITY): Payer: PPO

## 2017-06-21 ENCOUNTER — Observation Stay (HOSPITAL_COMMUNITY): Payer: PPO

## 2017-06-21 ENCOUNTER — Encounter (HOSPITAL_COMMUNITY): Payer: Self-pay | Admitting: Radiology

## 2017-06-21 DIAGNOSIS — I4892 Unspecified atrial flutter: Secondary | ICD-10-CM | POA: Diagnosis present

## 2017-06-21 DIAGNOSIS — Z7901 Long term (current) use of anticoagulants: Secondary | ICD-10-CM

## 2017-06-21 DIAGNOSIS — I480 Paroxysmal atrial fibrillation: Secondary | ICD-10-CM | POA: Diagnosis not present

## 2017-06-21 DIAGNOSIS — E43 Unspecified severe protein-calorie malnutrition: Secondary | ICD-10-CM | POA: Diagnosis present

## 2017-06-21 DIAGNOSIS — I2510 Atherosclerotic heart disease of native coronary artery without angina pectoris: Secondary | ICD-10-CM | POA: Diagnosis present

## 2017-06-21 DIAGNOSIS — I420 Dilated cardiomyopathy: Secondary | ICD-10-CM | POA: Diagnosis present

## 2017-06-21 DIAGNOSIS — E785 Hyperlipidemia, unspecified: Secondary | ICD-10-CM | POA: Diagnosis present

## 2017-06-21 DIAGNOSIS — R531 Weakness: Secondary | ICD-10-CM | POA: Diagnosis present

## 2017-06-21 DIAGNOSIS — M797 Fibromyalgia: Secondary | ICD-10-CM | POA: Diagnosis present

## 2017-06-21 DIAGNOSIS — G2581 Restless legs syndrome: Secondary | ICD-10-CM | POA: Diagnosis present

## 2017-06-21 DIAGNOSIS — R740 Nonspecific elevation of levels of transaminase and lactic acid dehydrogenase [LDH]: Secondary | ICD-10-CM

## 2017-06-21 DIAGNOSIS — K828 Other specified diseases of gallbladder: Secondary | ICD-10-CM | POA: Diagnosis present

## 2017-06-21 DIAGNOSIS — R945 Abnormal results of liver function studies: Secondary | ICD-10-CM | POA: Diagnosis not present

## 2017-06-21 DIAGNOSIS — K3184 Gastroparesis: Secondary | ICD-10-CM | POA: Diagnosis present

## 2017-06-21 DIAGNOSIS — K219 Gastro-esophageal reflux disease without esophagitis: Secondary | ICD-10-CM | POA: Diagnosis present

## 2017-06-21 DIAGNOSIS — Z823 Family history of stroke: Secondary | ICD-10-CM | POA: Diagnosis not present

## 2017-06-21 DIAGNOSIS — I10 Essential (primary) hypertension: Secondary | ICD-10-CM | POA: Diagnosis not present

## 2017-06-21 DIAGNOSIS — I447 Left bundle-branch block, unspecified: Secondary | ICD-10-CM | POA: Diagnosis present

## 2017-06-21 DIAGNOSIS — J439 Emphysema, unspecified: Secondary | ICD-10-CM | POA: Diagnosis present

## 2017-06-21 DIAGNOSIS — K8063 Calculus of gallbladder and bile duct with acute cholecystitis with obstruction: Secondary | ICD-10-CM | POA: Diagnosis present

## 2017-06-21 DIAGNOSIS — D509 Iron deficiency anemia, unspecified: Secondary | ICD-10-CM | POA: Diagnosis present

## 2017-06-21 DIAGNOSIS — Z803 Family history of malignant neoplasm of breast: Secondary | ICD-10-CM | POA: Diagnosis not present

## 2017-06-21 DIAGNOSIS — E875 Hyperkalemia: Secondary | ICD-10-CM | POA: Diagnosis present

## 2017-06-21 DIAGNOSIS — R7401 Elevation of levels of liver transaminase levels: Secondary | ICD-10-CM

## 2017-06-21 DIAGNOSIS — K8020 Calculus of gallbladder without cholecystitis without obstruction: Secondary | ICD-10-CM | POA: Diagnosis not present

## 2017-06-21 DIAGNOSIS — G459 Transient cerebral ischemic attack, unspecified: Secondary | ICD-10-CM | POA: Diagnosis present

## 2017-06-21 DIAGNOSIS — N179 Acute kidney failure, unspecified: Secondary | ICD-10-CM | POA: Diagnosis present

## 2017-06-21 DIAGNOSIS — I959 Hypotension, unspecified: Secondary | ICD-10-CM | POA: Diagnosis not present

## 2017-06-21 DIAGNOSIS — G894 Chronic pain syndrome: Secondary | ICD-10-CM | POA: Diagnosis present

## 2017-06-21 DIAGNOSIS — I110 Hypertensive heart disease with heart failure: Secondary | ICD-10-CM | POA: Diagnosis present

## 2017-06-21 DIAGNOSIS — K8050 Calculus of bile duct without cholangitis or cholecystitis without obstruction: Secondary | ICD-10-CM | POA: Diagnosis not present

## 2017-06-21 DIAGNOSIS — I5032 Chronic diastolic (congestive) heart failure: Secondary | ICD-10-CM | POA: Diagnosis present

## 2017-06-21 DIAGNOSIS — D62 Acute posthemorrhagic anemia: Secondary | ICD-10-CM | POA: Diagnosis present

## 2017-06-21 LAB — COMPREHENSIVE METABOLIC PANEL
ALT: 783 U/L — ABNORMAL HIGH (ref 14–54)
ALT: 829 U/L — ABNORMAL HIGH (ref 14–54)
AST: 1237 U/L — ABNORMAL HIGH (ref 15–41)
AST: 1257 U/L — ABNORMAL HIGH (ref 15–41)
Albumin: 2.9 g/dL — ABNORMAL LOW (ref 3.5–5.0)
Albumin: 2.7 g/dL — ABNORMAL LOW (ref 3.5–5.0)
Alkaline Phosphatase: 671 U/L — ABNORMAL HIGH (ref 38–126)
Alkaline Phosphatase: 617 U/L — ABNORMAL HIGH (ref 38–126)
Anion gap: 9 (ref 5–15)
Anion gap: 10 (ref 5–15)
BUN: 10 mg/dL (ref 6–20)
BUN: 11 mg/dL (ref 6–20)
CO2: 22 mmol/L (ref 22–32)
CO2: 22 mmol/L (ref 22–32)
Calcium: 8.8 mg/dL — ABNORMAL LOW (ref 8.9–10.3)
Calcium: 8.7 mg/dL — ABNORMAL LOW (ref 8.9–10.3)
Chloride: 104 mmol/L (ref 101–111)
Chloride: 104 mmol/L (ref 101–111)
Creatinine, Ser: 0.7 mg/dL (ref 0.44–1.00)
Creatinine, Ser: 0.67 mg/dL (ref 0.44–1.00)
GFR calc Af Amer: >60 mL/min (ref >60)
GFR calc Af Amer: >60 mL/min (ref >60)
GFR calc non Af Amer: >60 mL/min (ref >60)
GFR calc non Af Amer: >60 mL/min (ref >60)
Glucose, Bld: 126 mg/dL — ABNORMAL HIGH (ref 65–99)
Glucose, Bld: 115 mg/dL — ABNORMAL HIGH (ref 65–99)
Potassium: 4.3 mmol/L (ref 3.5–5.1)
Potassium: 4.3 mmol/L (ref 3.5–5.1)
Sodium: 135 mmol/L (ref 135–145)
Sodium: 136 mmol/L (ref 135–145)
Total Bilirubin: 2.5 mg/dL — ABNORMAL HIGH (ref 0.3–1.2)
Total Bilirubin: 2 mg/dL — ABNORMAL HIGH (ref 0.3–1.2)
Total Protein: 6.4 g/dL — ABNORMAL LOW (ref 6.5–8.1)
Total Protein: 6 g/dL — ABNORMAL LOW (ref 6.5–8.1)

## 2017-06-21 LAB — CBC
HCT: 29.6 % — ABNORMAL LOW (ref 36.0–46.0)
Hemoglobin: 9.3 g/dL — ABNORMAL LOW (ref 12.0–15.0)
MCH: 28.1 pg (ref 26.0–34.0)
MCHC: 31.4 g/dL (ref 30.0–36.0)
MCV: 89.4 fL (ref 78.0–100.0)
Platelets: 217 10*3/uL (ref 150–400)
RBC: 3.31 MIL/uL — ABNORMAL LOW (ref 3.87–5.11)
RDW: 14.1 % (ref 11.5–15.5)
WBC: 5.9 10*3/uL (ref 4.0–10.5)

## 2017-06-21 LAB — UREA NITROGEN, URINE: Urea Nitrogen, Ur: 541 mg/dL

## 2017-06-21 LAB — GAMMA GT: GGT: 517 U/L — ABNORMAL HIGH (ref 7–50)

## 2017-06-21 LAB — BILIRUBIN, DIRECT: Bilirubin, Direct: 1.3 mg/dL — ABNORMAL HIGH (ref 0.1–0.5)

## 2017-06-21 MED ORDER — TECHNETIUM TC 99M MEBROFENIN IV KIT
6.7000 | PACK | Freq: Once | INTRAVENOUS | Status: AC | PRN
  Administered 2017-06-21: 6.7 via INTRAVENOUS

## 2017-06-21 MED ORDER — IOPAMIDOL (ISOVUE-300) INJECTION 61%
INTRAVENOUS | Status: AC
  Filled 2017-06-21: qty 30

## 2017-06-21 MED ORDER — CARVEDILOL 25 MG PO TABS
25.0000 mg | ORAL_TABLET | Freq: Two times a day (BID) | ORAL | Status: DC
  Administered 2017-06-21 – 2017-07-02 (×22): 25 mg via ORAL
  Filled 2017-06-21 (×14): qty 1
  Filled 2017-06-21: qty 2
  Filled 2017-06-21 (×8): qty 1

## 2017-06-21 MED ORDER — IOPAMIDOL (ISOVUE-300) INJECTION 61%
INTRAVENOUS | Status: AC
  Filled 2017-06-21: qty 100

## 2017-06-21 MED ORDER — IOPAMIDOL (ISOVUE-300) INJECTION 61%
100.0000 mL | Freq: Once | INTRAVENOUS | Status: AC | PRN
  Administered 2017-06-21: 100 mL via INTRAVENOUS

## 2017-06-21 NOTE — H&P (View-Only) (Signed)
Referring Provider: [facility]   Primary Care Physician:  [name], Becky Apa, DO Primary Gastroenterologist:  Becky Rusk, MD  Reason for Consultation:   Possible CBD stone    ASSESSMENT AND PLAN:     39. 72 yo female with recent TIA, readmitted with recurrent symptoms and hypotension but head CT scan negative for acute abnormalities.   2. Abnormal liver chemistries, mixed pattern. No gallbladder activity / small bowel activity on HIDA, cannot get MRCP due to pacemaker. Interestingly she hasn't had any abdominal pain. Of note alk phos has actually been mildly elevated over the last few months, weight stable.  -If didn't have HIDA results would think this is ischemic based on labs,  hypotensive episode, absence of pain. She does takes chronic pain medications which can cause an abnormal HIDA. Furthermore, the biliary duct dilation dates back years. Choledocholithiasis still on DDx list however as she has cholelithiasis.  -Rule out pancreatic lesion (especially since alk phos has been slowly rising as outpatient). Will obtain CT scan with pancreatic protocol and CA-19-9 -Viral hep panel negative. Will check AMA, ggt and fractionate her bilirubin.  -am liver labs -NPO after MN in case CT scan does suggest CBD stone and ERCP needed. Though we may need to wait on ERCP as patient is still getting Eliquis which would preclude sphincterotomy.     PLEASE HOLD ELIQUIS if possible.   2. Recent TIA, on Eliquis.   3. Hx of SBO / multiple abdominal surgeries including a subtotal colectomy. She has significant adhesive disease.   HPI: [name] is a 72 y.o. female with multiple medical problems not limited to chronic diastolic heart failure, COPD O2 dependent, and atrial fibrillation.  She was hospitalized couple weeks ago with TIA and has since started Eliquis.  At home 2 days ago patient started dropping things and  had trouble with her gait.  She has been having severe headaches as  well.  Patient became concerned she was having another TIA and presented to the emergency department.  Head CT negative for acute abnormalities.  Patient cannot have an MRI due to her pacemaker. In the ED she was hypotensive for unclear reasons.  No fevers at home and mild and white count only minimally elevated.  She was given IV fluids, pancultured.  UA unremarkable and blood culture negative at day 1.  Viral hepatitis studies negative  Liver chemistries noted to be markedly abnormal in a mixed pattern.  Transaminases worsened overnight as did cholestasis  Patient has chronic back pain.  She sometimes has abdominal discomfort but nothing out of the ordinary lately.  No nausea or vomiting. She has mild itching. Weight has been stable.    Ref. Range 06/19/2017 23:15 06/19/2017 23:36 06/21/2017 08:05  Creatinine Latest Ref Range: 0.44 - 1.00 mg/dL 1.28 (H) 1.30 (H) 0.70  Alkaline Phosphatase Latest Ref Range: 38 - 126 U/L 351 (H)  671 (H)  Albumin Latest Ref Range: 3.5 - 5.0 g/dL 3.1 (L)  2.9 (L)  AST Latest Ref Range: 15 - 41 U/L 225 (H)  1,237 (H)  ALT Latest Ref Range: 14 - 54 U/L 315 (H)  783 (H)    Past Medical History:  Diagnosis Date  . Anemia   . Anginal pain ([place])   . Arthritis    right hip  . Atrial fibrillation ([place])   . Blood transfusion   . CAD (coronary artery disease)    mild disease per cath in 2011  . CHF (congestive  heart failure) ([place])   . Chronic abdominal pain   . Chronic nausea   . Chronic pain syndrome   . COPD (chronic obstructive pulmonary disease) (H[facility])    continuous O2- 2l  . Diverticulitis   . Elbow fracture, left aug 2012  . Elevated liver enzymes   . Fibromyalgia    COUPLE OF TIMES A YEAR-PT HAS EPISODES OF CONFUSION-USUALLY INVOLVES DAY/NIGHT REVERSAL AND EPISODES OF TWITCHING AND FALLS--SEES DR. Jacelyn Ross - NEUROLOGIST-LAST SEEN 07/10/11  . Gastroparesis   . GERD (gastroesophageal reflux disease)   . H/O: GI bleed    from Pradaxa  . Hepatitis    hx of in  high school   . HTN (hypertension)    under control; has been on med. x "years"  . Hx MRSA infection   . Hx of gastric ulcer   . Hx of stress fx aug 2011   right hip   . LBBB (left bundle branch block)   . Oxygen dependent    2 liters via nasal cannula at all times  . Pacemaker    CRT therapy; followed by Dr. Caryl Ross  . Pleural effusion     s/p right thoracentesis 03/09  . Pneumonia - 03/09, 12/09, 02/10, 06/10   MOST RECENT FEB 2013  . Primary dilated cardiomyopathy (H[facility])    EF 45 to 50% per echo in Jan 2012  . RLS (restless legs syndrome)   . Silent aspiration   . Small bowel obstruction ([place])   . Urinary incontinence     -INTERSTIM IMPLANT NOT FUNCTIONING PER PT  . Venous embolism and thrombosis of subclavian vein ([place])    after pacemaker insertion Oct 2011  . Vitamin B12 deficiency     Past Surgical History:  Procedure Laterality Date  . APPENDECTOMY    . CARDIAC CATHETERIZATION  04/08/2006, 11/16/2009  . COLECTOMY     Intestinal resection (5times)  . COLECTOMY  02/04/2000   ex. lap., intra-abd. subtotal colectomy with ileosigmoid colon anastomosies and lysis of adhesions  . CYSTOSCOPY  11/11/2008  . CYSTOSCOPY W/ RETROGRADES  11/11/2008   right  . CYSTOSCOPY WITH INJECTION  04/30/2006   transurethral collagen injection; incision vaginal stenosis  . ESOPHAGOGASTRODUODENOSCOPY N/A 02/02/2013   Procedure: ESOPHAGOGASTRODUODENOSCOPY (EGD);  Surgeon: Becky Dragon, MD;  Location: [facility];  Service: Endoscopy;  Laterality: N/A;  . EXPLORATORY LAPAROTOMY  04/27/2009   lysis of adhesions, gastrostomy tube  . GASTROCUTANEOUS FISTULA CLOSURE    . INTERSTIM IMPLANT PLACEMENT  05/28/2006 - stage I   06/05/2006 - stage II  . INTERSTIM IMPLANT PLACEMENT  02/05/2012   Procedure: [name] IMPLANT FIRST STAGE;  Surgeon: Becky Packer, MD;  Location: [facility];  Service: Urology;  Laterality: N/A;  Replacement of Interstim Lead     . INTERSTIM IMPLANT REVISION  03/06/2011    Procedure: REVISION OF [name];  Surgeon: Becky Packer, MD;  Location: [facility];  Service: Urology;  Laterality: N/A;  Replacement of Neurostimulator  . INTERSTIM IMPLANT REVISION  10/23/2007  . PACEMAKER PLACEMENT  12/21/2009  . Peg removed     with complications  . PORT-A-CATH REMOVAL  07/27/2011   Procedure: REMOVAL PORT-A-CATH;  Surgeon: Rolm Bookbinder, MD;  Location: [facility];  Service: General;  Laterality: N/A;  . PORTACATH PLACEMENT  12/16/2009  . SAVORY DILATION N/A 02/02/2013   Procedure: SAVORY DILATION;  Surgeon: Becky Dragon, MD;  Location: [facility];  Service: Endoscopy;  Laterality: N/A;  . SMALL INTESTINE SURGERY  05/20/2001  ex. lap., resection of small bowel stricture; gastrostomy; insertion central line  . TONSILLECTOMY  age 28  . TOTAL ABDOMINAL HYSTERECTOMY     complete  . TOTAL HIP ARTHROPLASTY  08/03/2011   Procedure: TOTAL HIP ARTHROPLASTY ANTERIOR APPROACH;  Surgeon: Mcarthur Rossetti, MD;  Location: [facility];  Service: Orthopedics;  Laterality: Right;    Prior to Admission medications   Medication Sig Start Date End Date Taking? Authorizing Provider  apixaban (ELIQUIS) 5 MG TABS tablet Take 1 tablet (5 mg total) by mouth 2 (two) times daily. 06/06/17  Yes Roma Schanz R, DO  atorvastatin (LIPITOR) 20 MG tablet Take 1 tablet (20 mg total) by mouth daily at 6 PM. 06/06/17  Yes Lowne Chase, Yvonne R, DO  carvedilol (COREG) 25 MG tablet TAKE 1 TABLET BY MOUTH TWICE DAILY Patient taking differently: TAKE 1 TABLET (20m) BY MOUTH TWICE DAILY 01/31/17  Yes HMinus Breeding MD  furosemide (LASIX) 40 MG tablet TAKE ONE TABLET BY MOUTH IN THE MORNING Patient taking differently: TAKE ONE TABLET (437m BY MOUTH IN THE MORNING 11/27/16  Yes HoMinus BreedingMD  gabapentin (NEURONTIN) 600 MG tablet Take 1 tablet (600 mg total) by mouth 3 (three) times daily. 06/01/17  Yes AlAline AugustMD  nitroGLYCERIN (NITROSTAT) 0.4 MG SL tablet Place 1 tablet  (0.4 mg total) under the tongue every 5 (five) minutes as needed. CHEST PAIN 05/15/17  Yes KlDeboraha SprangMD  oxyCODONE-acetaminophen (PERCOCET) 10-325 MG tablet Take 1 tablet by mouth every 4 (four) hours as needed. Patient taking differently: Take 1 tablet by mouth every 4 (four) hours as needed for pain.  05/30/17  Yes LoRoma Schanz, DO  pantoprazole (PROTONIX) 40 MG tablet Take 1 tablet (40 mg total) by mouth 2 (two) times daily. 06/01/17  Yes AlAline AugustMD  promethazine (PHENERGAN) 25 MG tablet Take 1 tablet (25 mg total) by mouth every 8 (eight) hours as needed. for nausea 02/21/16  Yes GeGatha MayerMD  Tiotropium Bromide Monohydrate (SPIRIVA RESPIMAT) 1.25 MCG/ACT AERS Inhale 2 puffs into the lungs daily.   Yes [provider]  tiZANidine (ZANAFLEX) 4 MG tablet Take 1 tablet (4 mg total) by mouth 3 (three) times daily as needed. for muscle spams 05/30/17  Yes Lowne ChLyndal Pulley, DO  zolpidem (AMBIEN) 5 MG tablet TAKE 1 TABLET BY MOUTH ONCE DAILY AT BEDTIME AS NEEDED FOR  SLEEP Patient taking differently: Take 5 mg by mouth at bedtime as needed for sleep.  03/07/17  Yes LoRoma Schanz, DO  iron dextran complex (INFED) 50 MG/ML injection Please give infed infusion (no test dose needed) over 4 hours per pharmacy calculated dose. Ht: 5'2 Wt: 99 pounds Hgb: 12 12/20/10 06/05/11  BrLafayette DragonMD    Current Facility-Administered Medications  Medication Dose Route Frequency Provider Last Rate Last Dose  . 0.9 %  sodium chloride infusion   Intravenous Continuous NiIvor CostaMD 75 mL/hr at 06/20/17 [phone]  . albuterol (PROVENTIL) (2.5 MG/3ML) 0.083% nebulizer solution 2.5 mg  2.5 mg Nebulization Q4H PRN NiIvor CostaMD      . apixaban (EArne Clevelandtablet 5 mg  5 mg Oral BID NiIvor CostaMD   5 mg at 06/21/17 1232  . carvedilol (COREG) tablet 25 mg  25 mg Oral BID NeMariel AloeMD   25 mg at 06/21/17 1232  . gabapentin (NEURONTIN) tablet 600 mg  600 mg Oral TID NiIvor CostaMD  600 mg at 06/20/17 2144  . nitroGLYCERIN (NITROSTAT) SL tablet 0.4 mg  0.4 mg Sublingual Q5 min PRN Ivor Costa, MD      . ondansetron [facility]) injection 4 mg  4 mg Intravenous Q8H PRN Ivor Costa, MD   4 mg at 06/21/17 0845  . oxyCODONE (Oxy IR/ROXICODONE) immediate release tablet 5 mg  5 mg Oral Q6H PRN Ivor Costa, MD   5 mg at 06/21/17 1220  . pantoprazole (PROTONIX) EC tablet 40 mg  40 mg Oral BID Ivor Costa, MD   40 mg at 06/20/17 2144  . senna-docusate (Senokot-S) tablet 1 tablet  1 tablet Oral QHS PRN Ivor Costa, MD      . tiotropium [facility]) inhalation capsule 18 mcg  1 capsule Inhalation Daily Ivor Costa, MD   18 mcg at 06/20/17 2641  . tiZANidine (ZANAFLEX) tablet 4 mg  4 mg Oral TID PRN Ivor Costa, MD      . zolpidem (AMBIEN) tablet 5 mg  5 mg Oral QHS PRN Ivor Costa, MD   5 mg at 06/20/17 2307    Allergies as of 06/19/2017 - Review Complete 06/19/2017  Allergen Reaction Noted  . Dabigatran etexilate mesylate Other (See Comments) 06/26/2010  . Talwin [pentazocine] Other (See Comments) 09/12/2011  . Lactate Rash 05/16/2015    Family History  Problem Relation Age of Onset  . Heart disease Father   . Stroke Mother   . Heart disease Mother   . Heart disease Brother   . Breast cancer Maternal Aunt   . Heart disease Paternal Aunt   . Diabetes Paternal Grandfather   . Colon cancer Neg Hx     Social History   Socioeconomic History  . Marital status: Married    Spouse name: Not on file  . Number of children: 5  . Years of education: Not on file  . Highest education level: Not on file  Occupational History  . Occupation: disabled    Employer: RETIRED  Social Needs  . Financial resource strain: Not on file  . Food insecurity:    Worry: Not on file    Inability: Not on file  . Transportation needs:    Medical: Not on file    Non-medical: Not on file  Tobacco Use  . Smoking status: Former Smoker    Packs/day: 1.00    Years: 20.00    Pack years: 20.00     Last attempt to quit: 03/19/1986    Years since quitting: 31.2  . Smokeless tobacco: Never Used  Substance and Sexual Activity  . Alcohol use: No    Alcohol/week: 0.0 oz  . Drug use: No  . Sexual activity: Yes  Lifestyle  . Physical activity:    Days per week: Not on file    Minutes per session: Not on file  . Stress: Not on file  Relationships  . Social connections:    Talks on phone: Not on file    Gets together: Not on file    Attends religious service: Not on file    Active member of club or organization: Not on file    Attends meetings of clubs or organizations: Not on file    Relationship status: Not on file  . Intimate partner violence:    Fear of current or ex partner: Not on file    Emotionally abused: Not on file    Physically abused: Not on file    Forced sexual activity: Not on file  Other Topics Concern  .  Not on file  Social History Narrative   Occupation: Disabled   Married - second time - biologic daughter (in [place]) from first marriage - adopted 4 3 alive with second marriage   Alcohol Use - no   Illicit Drug Use - no   Patient is a former smoker, quit 1988 x7yr, <1ppd.    Daily Caffeine Use   01/20/2015 - updated    Review of Systems: All systems reviewed and negative except where noted in HPI.  Physical Exam: Vital signs in last 24 hours: Temp:  [97.7 F (36.5 C)-99.1 F (37.3 C)] 98.3 F (36.8 C) (04/05 1343) Pulse Rate:  [66-94] 73 (04/05 1343) Resp:  [16-20] 20 (04/05 1343) BP: (146-172)/(73-81) 157/80 (04/05 1343) SpO2:  [95 %-98 %] 98 % (04/05 1343) Weight:  [101 lb 1.6 oz (45.9 kg)] 101 lb 1.6 oz (45.9 kg) (04/05 0532) Last BM Date: 06/20/17 General:   Alert, thin white female in NAD Psych:  Pleasant, cooperative. Normal mood and affect. Eyes:  Pupils equal, sclera clear, no icterus.   Conjunctiva pink. Ears:  Normal auditory acuity. Nose:  No deformity, discharge,  or lesions. Neck:  Supple; no masses Lungs:  Clear throughout to  auscultation.   No wheezes, crackles, or rhonchi.  Heart:  Regular rate and rhythm; no murmurs, no edema Abdomen:  Soft, non-distended, very mild RUQ tenderness.  BS active, no palp mass    Rectal:  Deferred  Msk:  Symmetrical without gross deformities. . Pulses:  Normal pulses noted. Neurologic:  Alert and  oriented x4;  grossly normal neurologically. Skin:  Intact without significant lesions or rashes..   Intake/Output from previous day: 04/04 0701 - 04/05 0700 In: 2308.8 [P.O.:480; I.V.:1828.8] Out: 1500 [Urine:1500] Intake/Output this shift: Total I/O In: 240 [P.O.:240] Out: 700 [Urine:700]  Lab Results: Recent Labs    06/19/17 2315 06/19/17 2336 06/20/17 0351 06/21/17 0805  WBC 11.2*  --  10.3 5.9  HGB 10.1* 10.5* 9.0* 9.3*  HCT 31.5* 31.0* 28.9* 29.6*  PLT 272  --  211 217   BMET Recent Labs    06/19/17 2315 06/19/17 2336 06/20/17 0351 06/21/17 0805  NA 134* 137 136 135  K 3.9 3.9 4.1 4.3  CL 97* 95* 102 104  CO2 28  --  26 22  GLUCOSE 146* 147* 134* 126*  BUN '20 20 18 10  ' CREATININE 1.28* 1.30* 1.08* 0.70  CALCIUM 8.3*  --  8.1* 8.8*   LFT Recent Labs    06/21/17 0805  PROT 6.4*  ALBUMIN 2.9*  AST 1,237*  ALT 783*  ALKPHOS 671*  BILITOT 2.5*   PT/INR Recent Labs    06/19/17 2315  LABPROT 24.9*  INR 2.28   Studies/Results: Ct Head Wo Contrast  Result Date: 06/20/2017 CLINICAL DATA:  Headache, trouble walking weakness EXAM: CT HEAD WITHOUT CONTRAST TECHNIQUE: Contiguous axial images were obtained from the base of the skull through the vertex without intravenous contrast. COMPARISON:  05/31/2017, 06/04/2011 FINDINGS: Brain: No acute territorial infarction, hemorrhage or intracranial mass is visualized. Stable ventricle size. Stable calcified left frontal dural nodule. Mild small vessel ischemic changes of the white matter. Vascular: No hyperdense vessels. Scattered calcifications at the carotid siphons Skull: Normal. Negative for fracture or  focal lesion. Sinuses/Orbits: Retention cysts in the left sphenoid sinus. Mild mucosal thickening in the ethmoid sinuses. No acute orbital abnormality. Other: None IMPRESSION: 1. No CT evidence for acute intracranial abnormality. 2. Mild atrophy and small vessel ischemic changes of the white matter.  Electronically Signed   By: Donavan Foil M.D.   On: 06/20/2017 00:14   Nm Hepatobiliary Liver Func  Result Date: 06/21/2017 CLINICAL DATA:  Patient with elevated LFTs, cholelithiasis and biliary ductal dilatation. EXAM: NUCLEAR MEDICINE HEPATOBILIARY IMAGING TECHNIQUE: Sequential images of the abdomen were obtained out to 60 minutes following intravenous administration of radiopharmaceutical. RADIOPHARMACEUTICALS:  6.70 mCi Tc-52m Choletec IV COMPARISON:  Right upper quadrant ultrasound 06/20/2017 FINDINGS: Prompt uptake and biliary excretion of activity by the liver is seen. No activity is demonstrated within the gallbladder or small bowel. IMPRESSION: No radiotracer activity demonstrated within the gallbladder or small bowel compatible with biliary obstruction which may be secondary to common bile duct stone or potentially pancreatic mass/distal CBD tumor. Recommend further evaluation with MRI/MRCP. These results will be called to the ordering clinician or representative by the Radiologist Assistant, and communication documented in the PACS or zVision Dashboard. Electronically Signed   By: DLovey NewcomerM.D.   On: 06/21/2017 12:39   Dg Chest Portable 1 View  Result Date: 06/20/2017 CLINICAL DATA:  Hypotension EXAM: PORTABLE CHEST 1 VIEW COMPARISON:  03/21/2017 FINDINGS: Unchanged position of left chest wall 3 lead AICD leads. Chronic interstitial opacities are unchanged. No new area of consolidation. No pleural effusion or pneumothorax. IMPRESSION: Unchanged appearance of chronic interstitial opacities, likely secondary to chronic mycobacterial infection. Electronically Signed   By: KUlyses JarredM.D.   On:  06/20/2017 01:15   UKoreaAbdomen Limited Ruq  Result Date: 06/20/2017 CLINICAL DATA:  Elevated LFTs EXAM: ULTRASOUND ABDOMEN LIMITED RIGHT UPPER QUADRANT COMPARISON:  None. FINDINGS: Gallbladder: Large echogenic focus is noted in well distended gallbladder. Mild posterior acoustical shadowing is noted in this may represent a prominent 3 cm gallstone. Possibility of a tumefactive sludge ball would deserve consideration as well. Mild gallbladder sludge is noted. No wall thickening or pericholecystic fluid is noted. Common bile duct: Diameter: 9.6 mm. This is slightly prominent for the patient's given age and associated with mild intrahepatic ductal dilatation. Liver: No focal mass lesion is noted. Mild biliary ductal dilatation is noted. Portal vein is patent on color Doppler imaging with normal direction of blood flow towards the liver. IMPRESSION: Changes of biliary dilatation of both the intrahepatic and extrahepatic tree. This raises suspicion for distal common bile duct stone. MRCP or ERCP may be helpful for further evaluation. Gallbladder sludge and large echogenic focus within the gallbladder. This may represent a focus of tumefactive sludge or prominent gallstone. The degree of posterior acoustical shadowing is somewhat decreased Electronically Signed   By: MInez CatalinaM.D.   On: 06/20/2017 15:15    PTye Savoy NP-C @  06/21/2017, 3:02 PM  Pager number [phone]  Attending physician's note   I have taken an interval history, reviewed the chart and examined the patient. I agree with the Advanced Practitioner's note, impression and recommendations.   72year old with abnormal liver function tests.  Ultrasound showed gallbladder sludge with dilated common bile duct and intrahepatic biliary ducts.(This is a chronic finding since 2010).  HIDA scan showed no gallbladder or small bowel activity.  MRCP cannot be performed due to pacemaker.  There is no abdominal pain.  Surgery wants to rule out any  CBD obstruction. Plan: Monitor liver function tests, proceed with pancreatic protocol CT scan, check CA 19-9, AMA and GGT since patient has been having increasing alk phos over the last few years.  Depending upon the CT, will decide regarding ERCP or EUS plus ERCP. of note that the  patient is on Eliquis due to TIAs.  Carmell Austria, MD

## 2017-06-21 NOTE — Consult Note (Addendum)
Referring Provider: [facility]   Primary Care Physician:  Carollee Herter, Alferd Apa, DO Primary Gastroenterologist:  Silvano Rusk, MD  Reason for Consultation:   Possible CBD stone    ASSESSMENT AND PLAN:     66. 72 yo female with recent TIA, readmitted with recurrent symptoms and hypotension but head CT scan negative for acute abnormalities.   2. Abnormal liver chemistries, mixed pattern. No gallbladder activity / small bowel activity on HIDA, cannot get MRCP due to pacemaker. Interestingly she hasn't had any abdominal pain. Of note alk phos has actually been mildly elevated over the last few months, weight stable.  -If didn't have HIDA results would think this is ischemic based on labs,  hypotensive episode, absence of pain. She does takes chronic pain medications which can cause an abnormal HIDA. Furthermore, the biliary duct dilation dates back years. Choledocholithiasis still on DDx list however as she has cholelithiasis.  -Rule out pancreatic lesion (especially since alk phos has been slowly rising as outpatient). Will obtain CT scan with pancreatic protocol and CA-19-9 -Viral hep panel negative. Will check AMA, ggt and fractionate her bilirubin.  -am liver labs -NPO after MN in case CT scan does suggest CBD stone and ERCP needed. Though we may need to wait on ERCP as patient is still getting Eliquis which would preclude sphincterotomy.     PLEASE HOLD ELIQUIS if possible.   2. Recent TIA, on Eliquis.   3. Hx of SBO / multiple abdominal surgeries including a subtotal colectomy. She has significant adhesive disease.   HPI: [name] is a 72 y.o. female with multiple medical problems not limited to chronic diastolic heart failure, COPD O2 dependent, and atrial fibrillation.  She was hospitalized couple weeks ago with TIA and has since started Eliquis.  At home 2 days ago patient started dropping things and  had trouble with her gait.  She has been having severe headaches as  well.  Patient became concerned she was having another TIA and presented to the emergency department.  Head CT negative for acute abnormalities.  Patient cannot have an MRI due to her pacemaker. In the ED she was hypotensive for unclear reasons.  No fevers at home and mild and white count only minimally elevated.  She was given IV fluids, pancultured.  UA unremarkable and blood culture negative at day 1.  Viral hepatitis studies negative  Liver chemistries noted to be markedly abnormal in a mixed pattern.  Transaminases worsened overnight as did cholestasis  Patient has chronic back pain.  She sometimes has abdominal discomfort but nothing out of the ordinary lately.  No nausea or vomiting. She has mild itching. Weight has been stable.    Ref. Range 06/19/2017 23:15 06/19/2017 23:36 06/21/2017 08:05  Creatinine Latest Ref Range: 0.44 - 1.00 mg/dL 1.28 (H) 1.30 (H) 0.70  Alkaline Phosphatase Latest Ref Range: 38 - 126 U/L 351 (H)  671 (H)  Albumin Latest Ref Range: 3.5 - 5.0 g/dL 3.1 (L)  2.9 (L)  AST Latest Ref Range: 15 - 41 U/L 225 (H)  1,237 (H)  ALT Latest Ref Range: 14 - 54 U/L 315 (H)  783 (H)    Past Medical History:  Diagnosis Date  . Anemia   . Anginal pain ([place])   . Arthritis    right hip  . Atrial fibrillation ([place])   . Blood transfusion   . CAD (coronary artery disease)    mild disease per cath in 2011  . CHF (congestive  heart failure) ([place])   . Chronic abdominal pain   . Chronic nausea   . Chronic pain syndrome   . COPD (chronic obstructive pulmonary disease) (HCC)    continuous O2- 2l  . Diverticulitis   . Elbow fracture, left aug 2012  . Elevated liver enzymes   . Fibromyalgia    COUPLE OF TIMES A YEAR-PT HAS EPISODES OF CONFUSION-USUALLY INVOLVES DAY/NIGHT REVERSAL AND EPISODES OF TWITCHING AND FALLS--SEES DR. Jacelyn Grip - NEUROLOGIST-LAST SEEN 07/10/11  . Gastroparesis   . GERD (gastroesophageal reflux disease)   . H/O: GI bleed    from Pradaxa  . Hepatitis    hx of in  high school   . HTN (hypertension)    under control; has been on med. x "years"  . Hx MRSA infection   . Hx of gastric ulcer   . Hx of stress fx aug 2011   right hip   . LBBB (left bundle branch block)   . Oxygen dependent    2 liters via nasal cannula at all times  . Pacemaker    CRT therapy; followed by Dr. Caryl Comes  . Pleural effusion     s/p right thoracentesis 03/09  . Pneumonia - 03/09, 12/09, 02/10, 06/10   MOST RECENT FEB 2013  . Primary dilated cardiomyopathy (HCC)    EF 45 to 50% per echo in Jan 2012  . RLS (restless legs syndrome)   . Silent aspiration   . Small bowel obstruction ([place])   . Urinary incontinence     -INTERSTIM IMPLANT NOT FUNCTIONING PER PT  . 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 Vitamin B12 deficiency     Past Surgical History:  Procedure Laterality Date  . APPENDECTOMY    . CARDIAC CATHETERIZATION  04/08/2006, 11/16/2009  . COLECTOMY     Intestinal resection (5times)  . COLECTOMY  02/04/2000   ex. lap., intra-abd. subtotal colectomy with ileosigmoid colon anastomosies and lysis of adhesions  . CYSTOSCOPY  11/11/2008  . CYSTOSCOPY W/ RETROGRADES  11/11/2008   right  . CYSTOSCOPY WITH INJECTION  04/30/2006   transurethral collagen injection; incision vaginal stenosis  . ESOPHAGOGASTRODUODENOSCOPY N/A 02/02/2013   Procedure: ESOPHAGOGASTRODUODENOSCOPY (EGD);  Surgeon: Lafayette Dragon, MD;  Location: [facility];  Service: Endoscopy;  Laterality: N/A;  . EXPLORATORY LAPAROTOMY  04/27/2009   lysis of adhesions, gastrostomy tube  . GASTROCUTANEOUS FISTULA CLOSURE    . INTERSTIM IMPLANT PLACEMENT  05/28/2006 - stage I   06/05/2006 - stage II  . INTERSTIM IMPLANT PLACEMENT  02/05/2012   Procedure: [name] IMPLANT FIRST STAGE;  Surgeon: Reece Packer, MD;  Location: [facility];  Service: Urology;  Laterality: N/A;  Replacement of Interstim Lead     . INTERSTIM IMPLANT REVISION  03/06/2011    Procedure: REVISION OF [name];  Surgeon: Reece Packer, MD;  Location: [facility];  Service: Urology;  Laterality: N/A;  Replacement of Neurostimulator  . INTERSTIM IMPLANT REVISION  10/23/2007  . PACEMAKER PLACEMENT  12/21/2009  . Peg removed     with complications  . PORT-A-CATH REMOVAL  07/27/2011   Procedure: REMOVAL PORT-A-CATH;  Surgeon: Rolm Bookbinder, MD;  Location: [place];  Service: General;  Laterality: N/A;  . PORTACATH PLACEMENT  12/16/2009  . SAVORY DILATION N/A 02/02/2013   Procedure: SAVORY DILATION;  Surgeon: Lafayette Dragon, MD;  Location: [facility];  Service: Endoscopy;  Laterality: N/A;  . SMALL INTESTINE SURGERY  05/20/2001  ex. lap., resection of small bowel stricture; gastrostomy; insertion central line  . TONSILLECTOMY  age 1  . TOTAL ABDOMINAL HYSTERECTOMY     complete  . TOTAL HIP ARTHROPLASTY  08/03/2011   Procedure: TOTAL HIP ARTHROPLASTY ANTERIOR APPROACH;  Surgeon: Mcarthur Rossetti, MD;  Location: [facility];  Service: Orthopedics;  Laterality: Right;    Prior to Admission medications   Medication Sig Start Date End Date Taking? Authorizing Provider  apixaban (ELIQUIS) 5 MG TABS tablet Take 1 tablet (5 mg total) by mouth 2 (two) times daily. 06/06/17  Yes Roma Schanz R, DO  atorvastatin (LIPITOR) 20 MG tablet Take 1 tablet (20 mg total) by mouth daily at 6 PM. 06/06/17  Yes Lowne Chase, Yvonne R, DO  carvedilol (COREG) 25 MG tablet TAKE 1 TABLET BY MOUTH TWICE DAILY Patient taking differently: TAKE 1 TABLET (52m) BY MOUTH TWICE DAILY 01/31/17  Yes HMinus Breeding MD  furosemide (LASIX) 40 MG tablet TAKE ONE TABLET BY MOUTH IN THE MORNING Patient taking differently: TAKE ONE TABLET (469m BY MOUTH IN THE MORNING 11/27/16  Yes HoMinus BreedingMD  gabapentin (NEURONTIN) 600 MG tablet Take 1 tablet (600 mg total) by mouth 3 (three) times daily. 06/01/17  Yes AlAline AugustMD  nitroGLYCERIN (NITROSTAT) 0.4 MG SL tablet Place 1 tablet  (0.4 mg total) under the tongue every 5 (five) minutes as needed. CHEST PAIN 05/15/17  Yes KlDeboraha SprangMD  oxyCODONE-acetaminophen (PERCOCET) 10-325 MG tablet Take 1 tablet by mouth every 4 (four) hours as needed. Patient taking differently: Take 1 tablet by mouth every 4 (four) hours as needed for pain.  05/30/17  Yes LoRoma Schanz, DO  pantoprazole (PROTONIX) 40 MG tablet Take 1 tablet (40 mg total) by mouth 2 (two) times daily. 06/01/17  Yes AlAline AugustMD  promethazine (PHENERGAN) 25 MG tablet Take 1 tablet (25 mg total) by mouth every 8 (eight) hours as needed. for nausea 02/21/16  Yes GeGatha MayerMD  Tiotropium Bromide Monohydrate (SPIRIVA RESPIMAT) 1.25 MCG/ACT AERS Inhale 2 puffs into the lungs daily.   Yes [provider]  tiZANidine (ZANAFLEX) 4 MG tablet Take 1 tablet (4 mg total) by mouth 3 (three) times daily as needed. for muscle spams 05/30/17  Yes Lowne ChLyndal Pulley, DO  zolpidem (AMBIEN) 5 MG tablet TAKE 1 TABLET BY MOUTH ONCE DAILY AT BEDTIME AS NEEDED FOR  SLEEP Patient taking differently: Take 5 mg by mouth at bedtime as needed for sleep.  03/07/17  Yes LoRoma Schanz, DO  iron dextran complex (INFED) 50 MG/ML injection Please give infed infusion (no test dose needed) over 4 hours per pharmacy calculated dose. Ht: 5'2 Wt: 99 pounds Hgb: 12 12/20/10 06/05/11  BrLafayette DragonMD    Current Facility-Administered Medications  Medication Dose Route Frequency Provider Last Rate Last Dose  . 0.9 %  sodium chloride infusion   Intravenous Continuous NiIvor CostaMD 75 mL/hr at 06/20/17 [id number]  . albuterol (PROVENTIL) (2.5 MG/3ML) 0.083% nebulizer solution 2.5 mg  2.5 mg Nebulization Q4H PRN NiIvor CostaMD      . apixaban (EArne Clevelandtablet 5 mg  5 mg Oral BID NiIvor CostaMD   5 mg at 06/21/17 1232  . carvedilol (COREG) tablet 25 mg  25 mg Oral BID NeMariel AloeMD   25 mg at 06/21/17 1232  . gabapentin (NEURONTIN) tablet 600 mg  600 mg Oral TID NiIvor CostaMD  600 mg at 06/20/17 2144  . nitroGLYCERIN (NITROSTAT) SL tablet 0.4 mg  0.4 mg Sublingual Q5 min PRN Ivor Costa, MD      . ondansetron [facility]) injection 4 mg  4 mg Intravenous Q8H PRN Ivor Costa, MD   4 mg at 06/21/17 0845  . oxyCODONE (Oxy IR/ROXICODONE) immediate release tablet 5 mg  5 mg Oral Q6H PRN Ivor Costa, MD   5 mg at 06/21/17 1220  . pantoprazole (PROTONIX) EC tablet 40 mg  40 mg Oral BID Ivor Costa, MD   40 mg at 06/20/17 2144  . senna-docusate (Senokot-S) tablet 1 tablet  1 tablet Oral QHS PRN Ivor Costa, MD      . tiotropium [facility]) inhalation capsule 18 mcg  1 capsule Inhalation Daily Ivor Costa, MD   18 mcg at 06/20/17 6948  . tiZANidine (ZANAFLEX) tablet 4 mg  4 mg Oral TID PRN Ivor Costa, MD      . zolpidem (AMBIEN) tablet 5 mg  5 mg Oral QHS PRN Ivor Costa, MD   5 mg at 06/20/17 2307    Allergies as of 06/19/2017 - Review Complete 06/19/2017  Allergen Reaction Noted  . Dabigatran etexilate mesylate Other (See Comments) 06/26/2010  . Talwin [pentazocine] Other (See Comments) 09/12/2011  . Lactate Rash 05/16/2015    Family History  Problem Relation Age of Onset  . Heart disease Father   . Stroke Mother   . Heart disease Mother   . Heart disease Brother   . Breast cancer Maternal Aunt   . Heart disease Paternal Aunt   . Diabetes Paternal Grandfather   . Colon cancer Neg Hx     Social History   Socioeconomic History  . Marital status: Married    Spouse name: Not on file  . Number of children: 5  . Years of education: Not on file  . Highest education level: Not on file  Occupational History  . Occupation: disabled    Employer: RETIRED  Social Needs  . Financial resource strain: Not on file  . Food insecurity:    Worry: Not on file    Inability: Not on file  . Transportation needs:    Medical: Not on file    Non-medical: Not on file  Tobacco Use  . Smoking status: Former Smoker    Packs/day: 1.00    Years: 20.00    Pack years: 20.00     Last attempt to quit: 03/19/1986    Years since quitting: 31.2  . Smokeless tobacco: Never Used  Substance and Sexual Activity  . Alcohol use: No    Alcohol/week: 0.0 oz  . Drug use: No  . Sexual activity: Yes  Lifestyle  . Physical activity:    Days per week: Not on file    Minutes per session: Not on file  . Stress: Not on file  Relationships  . Social connections:    Talks on phone: Not on file    Gets together: Not on file    Attends religious service: Not on file    Active member of club or organization: Not on file    Attends meetings of clubs or organizations: Not on file    Relationship status: Not on file  . Intimate partner violence:    Fear of current or ex partner: Not on file    Emotionally abused: Not on file    Physically abused: Not on file    Forced sexual activity: Not on file  Other Topics Concern  .  Not on file  Social History Narrative   Occupation: Disabled   Married - second time - biologic daughter (in [place]) from first marriage - adopted 4 3 alive with second marriage   Alcohol Use - no   Illicit Drug Use - no   Patient is a former smoker, quit 1988 x64yr, <1ppd.    Daily Caffeine Use   01/20/2015 - updated    Review of Systems: All systems reviewed and negative except where noted in HPI.  Physical Exam: Vital signs in last 24 hours: Temp:  [97.7 F (36.5 C)-99.1 F (37.3 C)] 98.3 F (36.8 C) (04/05 1343) Pulse Rate:  [66-94] 73 (04/05 1343) Resp:  [16-20] 20 (04/05 1343) BP: (146-172)/(73-81) 157/80 (04/05 1343) SpO2:  [95 %-98 %] 98 % (04/05 1343) Weight:  [101 lb 1.6 oz (45.9 kg)] 101 lb 1.6 oz (45.9 kg) (04/05 0532) Last BM Date: 06/20/17 General:   Alert, thin white female in NAD Psych:  Pleasant, cooperative. Normal mood and affect. Eyes:  Pupils equal, sclera clear, no icterus.   Conjunctiva pink. Ears:  Normal auditory acuity. Nose:  No deformity, discharge,  or lesions. Neck:  Supple; no masses Lungs:  Clear throughout to  auscultation.   No wheezes, crackles, or rhonchi.  Heart:  Regular rate and rhythm; no murmurs, no edema Abdomen:  Soft, non-distended, very mild RUQ tenderness.  BS active, no palp mass    Rectal:  Deferred  Msk:  Symmetrical without gross deformities. . Pulses:  Normal pulses noted. Neurologic:  Alert and  oriented x4;  grossly normal neurologically. Skin:  Intact without significant lesions or rashes..   Intake/Output from previous day: 04/04 0701 - 04/05 0700 In: 2308.8 [P.O.:480; I.V.:1828.8] Out: 1500 [Urine:1500] Intake/Output this shift: Total I/O In: 240 [P.O.:240] Out: 700 [Urine:700]  Lab Results: Recent Labs    06/19/17 2315 06/19/17 2336 06/20/17 0351 06/21/17 0805  WBC 11.2*  --  10.3 5.9  HGB 10.1* 10.5* 9.0* 9.3*  HCT 31.5* 31.0* 28.9* 29.6*  PLT 272  --  211 217   BMET Recent Labs    06/19/17 2315 06/19/17 2336 06/20/17 0351 06/21/17 0805  NA 134* 137 136 135  K 3.9 3.9 4.1 4.3  CL 97* 95* 102 104  CO2 28  --  26 22  GLUCOSE 146* 147* 134* 126*  BUN '20 20 18 10  ' CREATININE 1.28* 1.30* 1.08* 0.70  CALCIUM 8.3*  --  8.1* 8.8*   LFT Recent Labs    06/21/17 0805  PROT 6.4*  ALBUMIN 2.9*  AST 1,237*  ALT 783*  ALKPHOS 671*  BILITOT 2.5*   PT/INR Recent Labs    06/19/17 2315  LABPROT 24.9*  INR 2.28   Studies/Results: Ct Head Wo Contrast  Result Date: 06/20/2017 CLINICAL DATA:  Headache, trouble walking weakness EXAM: CT HEAD WITHOUT CONTRAST TECHNIQUE: Contiguous axial images were obtained from the base of the skull through the vertex without intravenous contrast. COMPARISON:  05/31/2017, 06/04/2011 FINDINGS: Brain: No acute territorial infarction, hemorrhage or intracranial mass is visualized. Stable ventricle size. Stable calcified left frontal dural nodule. Mild small vessel ischemic changes of the white matter. Vascular: No hyperdense vessels. Scattered calcifications at the carotid siphons Skull: Normal. Negative for fracture or  focal lesion. Sinuses/Orbits: Retention cysts in the left sphenoid sinus. Mild mucosal thickening in the ethmoid sinuses. No acute orbital abnormality. Other: None IMPRESSION: 1. No CT evidence for acute intracranial abnormality. 2. Mild atrophy and small vessel ischemic changes of the white matter.  Electronically Signed   By: Donavan Foil M.D.   On: 06/20/2017 00:14   Nm Hepatobiliary Liver Func  Result Date: 06/21/2017 CLINICAL DATA:  Patient with elevated LFTs, cholelithiasis and biliary ductal dilatation. EXAM: NUCLEAR MEDICINE HEPATOBILIARY IMAGING TECHNIQUE: Sequential images of the abdomen were obtained out to 60 minutes following intravenous administration of radiopharmaceutical. RADIOPHARMACEUTICALS:  6.70 mCi Tc-25m Choletec IV COMPARISON:  Right upper quadrant ultrasound 06/20/2017 FINDINGS: Prompt uptake and biliary excretion of activity by the liver is seen. No activity is demonstrated within the gallbladder or small bowel. IMPRESSION: No radiotracer activity demonstrated within the gallbladder or small bowel compatible with biliary obstruction which may be secondary to common bile duct stone or potentially pancreatic mass/distal CBD tumor. Recommend further evaluation with MRI/MRCP. These results will be called to the ordering clinician or representative by the Radiologist Assistant, and communication documented in the PACS or zVision Dashboard. Electronically Signed   By: DLovey NewcomerM.D.   On: 06/21/2017 12:39   Dg Chest Portable 1 View  Result Date: 06/20/2017 CLINICAL DATA:  Hypotension EXAM: PORTABLE CHEST 1 VIEW COMPARISON:  03/21/2017 FINDINGS: Unchanged position of left chest wall 3 lead AICD leads. Chronic interstitial opacities are unchanged. No new area of consolidation. No pleural effusion or pneumothorax. IMPRESSION: Unchanged appearance of chronic interstitial opacities, likely secondary to chronic mycobacterial infection. Electronically Signed   By: KUlyses JarredM.D.   On:  06/20/2017 01:15   UKoreaAbdomen Limited Ruq  Result Date: 06/20/2017 CLINICAL DATA:  Elevated LFTs EXAM: ULTRASOUND ABDOMEN LIMITED RIGHT UPPER QUADRANT COMPARISON:  None. FINDINGS: Gallbladder: Large echogenic focus is noted in well distended gallbladder. Mild posterior acoustical shadowing is noted in this may represent a prominent 3 cm gallstone. Possibility of a tumefactive sludge ball would deserve consideration as well. Mild gallbladder sludge is noted. No wall thickening or pericholecystic fluid is noted. Common bile duct: Diameter: 9.6 mm. This is slightly prominent for the patient's given age and associated with mild intrahepatic ductal dilatation. Liver: No focal mass lesion is noted. Mild biliary ductal dilatation is noted. Portal vein is patent on color Doppler imaging with normal direction of blood flow towards the liver. IMPRESSION: Changes of biliary dilatation of both the intrahepatic and extrahepatic tree. This raises suspicion for distal common bile duct stone. MRCP or ERCP may be helpful for further evaluation. Gallbladder sludge and large echogenic focus within the gallbladder. This may represent a focus of tumefactive sludge or prominent gallstone. The degree of posterior acoustical shadowing is somewhat decreased Electronically Signed   By: MInez CatalinaM.D.   On: 06/20/2017 15:15    PTye Savoy NP-C @  06/21/2017, 3:02 PM  Pager number [phone]  Attending physician's note   I have taken an interval history, reviewed the chart and examined the patient. I agree with the Advanced Practitioner's note, impression and recommendations.   72year old with abnormal liver function tests.  Ultrasound showed gallbladder sludge with dilated common bile duct and intrahepatic biliary ducts.(This is a chronic finding since 2010).  HIDA scan showed no gallbladder or small bowel activity.  MRCP cannot be performed due to pacemaker.  There is no abdominal pain.  Surgery wants to rule out any  CBD obstruction. Plan: Monitor liver function tests, proceed with pancreatic protocol CT scan, check CA 19-9, AMA and GGT since patient has been having increasing alk phos over the last few years.  Depending upon the CT, will decide regarding ERCP or EUS plus ERCP. of note that the  patient is on Eliquis due to TIAs.  Carmell Austria, MD

## 2017-06-21 NOTE — Progress Notes (Signed)
Call placed to [facility] PA and informed of hide a scan results.  PA stated that Dr. Teryl Lucy needed to notify GI. This scriber stated that PA should notify Dr. Teryl Lucy to remove the third party aspect of conversation.

## 2017-06-21 NOTE — Progress Notes (Signed)
Pt to radiology via stretcher  for hide-a scan per orders.

## 2017-06-21 NOTE — Progress Notes (Signed)
PROGRESS NOTE    Becky UNCAPHER  OOI:[id number] DOB: [birth year] DOA: 06/19/2017 PCP: Ann Held, DO   Brief Narrative: [name] is a 72 y.o. female with medical history significant of hypertension, hyperlipidemia, COPD, bronchiectasis, GERD, atrial fibrillation on Eliquis, pacemaker placement, left bundle blockage, gastric ulcer, GI bleeding, dCHF, anemia, psoriasis, abnormal liver function. She presented with lightheadedness found to have hypotension. Now with elevated LFTs concerning for cholecystitis.   Assessment & Plan:   Principal Problem:   Weakness Active Problems:   ANEMIA-IRON DEFICIENCY   Essential hypertension   Atrial fibrillation/flutter   COPD with emphysema (HCC)   GERD (gastroesophageal reflux disease)   Chronic diastolic CHF (congestive heart failure) (HCC)   TIA (transient ischemic attack)   AKI (acute kidney injury) (HCC)   Abnormal LFTs   Hypotension   HLD (hyperlipidemia)   CAD (coronary artery disease)   Protein-calorie malnutrition, severe (HCC)   Hypotension Resolved on arrival after IV fluids. Now hypertensive.  Essential hypertension Blood pressure high today.  Weakness Likely secondary to hypotension.  Gallstones Elevated LFTs AST/ALT/Alk phos continue to climb, now with elevated bilirubin. RUQ ultrasound significant for cholelithiasis. Question of distal CBD stone. Discussed with GI on 4/4 who recommended surgery consult. -General surgery recommendations: HIDA today  Atrial fibrillation Stable. -Continue Eliquis  COPD Stable. -Continue Spiriva and albuterol prn  Acute kidney injury Resolved with IV fluids.  TIA On Eliquis and Lipitor as an outpatient.  Hyperlipidemia -Hold Lipitor in setting of acute liver injury  LBBB S/p pacemaker.  Severe protein calorie malnutrition   DVT prophylaxis: Eliquis Code Status:   Code Status: Full Code Family Communication: Husband at bedside Disposition Plan:  Discharge pending workup for possible acute cholecystitis   Consultants:   General surgery  Procedures:   None  Antimicrobials:  None    Subjective: Mild abdominal pain. Back pain.  Objective: Vitals:   06/20/17 1151 06/20/17 2040 06/21/17 0532 06/21/17 0751  BP: 140/69 (!) 146/77 (!) 159/81 (!) 155/73  Pulse: 78 94 77 66  Resp: _0 Temp: 98 F (36.7 C) 99.1 F (37.3 C) 98.4 F (36.9 C) 97.7 F (36.5 C)  TempSrc: Oral Oral Oral Oral  SpO2: 100% 95% 95% 97%  Weight:   45.9 kg (101 lb 1.6 oz)   Height:        Intake/Output Summary (Last 24 hours) at 06/21/2017 1028 Last data filed at 06/21/2017 0600 Gross per 24 hour  Intake 2308.75 ml  Output 1500 ml  Net 808.75 ml   Filed Weights   06/20/17 0025 06/20/17 0523 06/21/17 0532  Weight: 44.9 kg (99 lb) 46.4 kg (102 lb 4.7 oz) 45.9 kg (101 lb 1.6 oz)    Examination:  General exam: Appears calm and comfortable Respiratory system: Clear to auscultation. Respiratory effort normal. Cardiovascular system: S1 & S2 heard, RRR. No murmurs, rubs, gallops or clicks. Gastrointestinal system: Abdomen is nondistended, soft and mildly tender in RUQ. No organomegaly or masses felt. Normal bowel sounds heard. Central nervous system: Alert and oriented. No focal neurological deficits. Extremities: No edema. No calf tenderness Skin: No cyanosis. No rashes Psychiatry: Judgement and insight appear normal. Mood & affect appropriate.     Data Reviewed: I have personally reviewed following labs and imaging studies  CBC: Recent Labs  Lab 06/19/17 2315 06/19/17 2336 06/20/17 0351 06/21/17 0805  WBC 11.2*  --  10.3 5.9  NEUTROABS 9.4*  --   --   --  HGB 10.1* 10.5* 9.0* 9.3*  HCT 31.5* 31.0* 28.9* 29.6*  MCV 91.0  --  90.6 89.4  PLT 272  --  211 259   Basic Metabolic Panel: Recent Labs  Lab 06/19/17 2315 06/19/17 2336 06/20/17 0351 06/21/17 0805  NA 134* 137 136 135  K 3.9 3.9 4.1 4.3  CL 97* 95* 102 104    CO2 28  --  26 22  GLUCOSE 146* 147* 134* 126*  BUN _0 CREATININE 1.28* 1.30* 1.08* 0.70  CALCIUM 8.3*  --  8.1* 8.8*   GFR: Estimated Creatinine Clearance: 45.7 mL/min (by C-G formula based on SCr of 0.7 mg/dL). Liver Function Tests: Recent Labs  Lab 06/19/17 2315 06/21/17 0805  AST 225* 1,237*  ALT 315* 783*  ALKPHOS 351* 671*  BILITOT 0.8 2.5*  PROT 6.7 6.4*  ALBUMIN 3.1* 2.9*   No results for input(s): LIPASE, AMYLASE in the last 168 hours. No results for input(s): AMMONIA in the last 168 hours. Coagulation Profile: Recent Labs  Lab 06/19/17 2315  INR 2.28   Cardiac Enzymes: No results for input(s): CKTOTAL, CKMB, CKMBINDEX, TROPONINI in the last 168 hours. BNP (last 3 results) No results for input(s): PROBNP in the last 8760 hours. HbA1C: No results for input(s): HGBA1C in the last 72 hours. CBG: No results for input(s): GLUCAP in the last 168 hours. Lipid Profile: No results for input(s): CHOL, HDL, LDLCALC, TRIG, CHOLHDL, LDLDIRECT in the last 72 hours. Thyroid Function Tests: No results for input(s): TSH, T4TOTAL, FREET4, T3FREE, THYROIDAB in the last 72 hours. Anemia Panel: No results for input(s): VITAMINB12, FOLATE, FERRITIN, TIBC, IRON, RETICCTPCT in the last 72 hours. Sepsis Labs: Recent Labs  Lab 06/20/17 0351  PROCALCITON 0.70    No results found for this or any previous visit (from the past 240 hour(s)).       Radiology Studies: Ct Head Wo Contrast  Result Date: 06/20/2017 CLINICAL DATA:  Headache, trouble walking weakness EXAM: CT HEAD WITHOUT CONTRAST TECHNIQUE: Contiguous axial images were obtained from the base of the skull through the vertex without intravenous contrast. COMPARISON:  05/31/2017, 06/04/2011 FINDINGS: Brain: No acute territorial infarction, hemorrhage or intracranial mass is visualized. Stable ventricle size. Stable calcified left frontal dural nodule. Mild small vessel ischemic changes of the white matter.  Vascular: No hyperdense vessels. Scattered calcifications at the carotid siphons Skull: Normal. Negative for fracture or focal lesion. Sinuses/Orbits: Retention cysts in the left sphenoid sinus. Mild mucosal thickening in the ethmoid sinuses. No acute orbital abnormality. Other: None IMPRESSION: 1. No CT evidence for acute intracranial abnormality. 2. Mild atrophy and small vessel ischemic changes of the white matter. Electronically Signed   By: Donavan Foil M.D.   On: 06/20/2017 00:14   Dg Chest Portable 1 View  Result Date: 06/20/2017 CLINICAL DATA:  Hypotension EXAM: PORTABLE CHEST 1 VIEW COMPARISON:  03/21/2017 FINDINGS: Unchanged position of left chest wall 3 lead AICD leads. Chronic interstitial opacities are unchanged. No new area of consolidation. No pleural effusion or pneumothorax. IMPRESSION: Unchanged appearance of chronic interstitial opacities, likely secondary to chronic mycobacterial infection. Electronically Signed   By: Ulyses Jarred M.D.   On: 06/20/2017 01:15   US Abdomen Limited Ruq  Result Date: 06/20/2017 CLINICAL DATA:  Elevated LFTs EXAM: ULTRASOUND ABDOMEN LIMITED RIGHT UPPER QUADRANT COMPARISON:  None. FINDINGS: Gallbladder: Large echogenic focus is noted in well distended gallbladder. Mild posterior acoustical shadowing is noted in this may represent a prominent 3 cm gallstone. Possibility of  a tumefactive sludge ball would deserve consideration as well. Mild gallbladder sludge is noted. No wall thickening or pericholecystic fluid is noted. Common bile duct: Diameter: 9.6 mm. This is slightly prominent for the patient's given age and associated with mild intrahepatic ductal dilatation. Liver: No focal mass lesion is noted. Mild biliary ductal dilatation is noted. Portal vein is patent on color Doppler imaging with normal direction of blood flow towards the liver. IMPRESSION: Changes of biliary dilatation of both the intrahepatic and extrahepatic tree. This raises suspicion for  distal common bile duct stone. MRCP or ERCP may be helpful for further evaluation. Gallbladder sludge and large echogenic focus within the gallbladder. This may represent a focus of tumefactive sludge or prominent gallstone. The degree of posterior acoustical shadowing is somewhat decreased Electronically Signed   By: Inez Catalina M.D.   On: 06/20/2017 15:15        Scheduled Meds: . apixaban  5 mg Oral BID  . gabapentin  600 mg Oral TID  . pantoprazole  40 mg Oral BID  . tiotropium  1 capsule Inhalation Daily   Continuous Infusions: . sodium chloride 75 mL/hr at 06/20/17 0337     LOS: 0 days     Cordelia Poche, MD [facility] 06/21/2017, 10:28 AM Pager: [phone]  If 7PM-7AM, please contact night-coverage [email or web address] Password [facility] 06/21/2017, 10:28 AM

## 2017-06-21 NOTE — Progress Notes (Signed)
[facility]/Trauma Progress Note      Assessment/Plan Principal Problem:   Weakness Active Problems:   ANEMIA-IRON DEFICIENCY   Essential hypertension   Atrial fibrillation/flutter - on Eliquis   LBBB - pacemaker   COPD with emphysema (HCC)   GERD (gastroesophageal reflux disease)   Chronic diastolic CHF (congestive heart failure) (HCC)   TIA (transient ischemic attack) - 05/31/17   AKI (acute kidney injury) (HCC)   Abnormal LFTs   Hypotension   HLD (hyperlipidemia)   CAD (coronary artery disease)   Protein-calorie malnutrition, severe (HCC)   Extensive abdominal surgical history  Elevated LFT's and gallstones on [place]  - HIDA pending - pt is not an ideal surgical candidate  FEN: NPO VTE: SCD's, Eliquis ID: none, WBC 10.3, afebrile Foley: none Follow up: TBD  DISPO: HIDA pending    LOS: 0 days    Subjective: CC: back pain  Pt states she is usually so focused on her back pain she is not sure if she was having abdominal pain. She states she had a mild SBO earlier in the week that resolved. Husband at bedside. Discussed HIDA scan.  Objective: Vital signs in last 24 hours: Temp:  [97.7 F (36.5 C)-99.1 F (37.3 C)] 97.7 F (36.5 C) (04/05 0751) Pulse Rate:  [66-94] 66 (04/05 0751) Resp:  [15-18] 18 (04/05 0751) BP: (140-159)/(64-81) 155/73 (04/05 0751) SpO2:  [95 %-100 %] 97 % (04/05 0751) Weight:  [45.9 kg (101 lb 1.6 oz)] 45.9 kg (101 lb 1.6 oz) (04/05 0532) Last BM Date: 06/20/17  Intake/Output from previous day: 04/04 0701 - 04/05 0700 In: 2308.8 [P.O.:480; I.V.:1828.8] Out: 1500 [Urine:1500] Intake/Output this shift: No intake/output data recorded.  PE: Gen:  Alert, NAD, pleasant, cooperative Pulm:  Rate and effort normal Abd: Soft, ND, +BS, no HSM, mild TTP in RUQ, no guarding, no Murphy's sign Skin: no rashes noted, warm and dry   Anti-infectives: Anti-infectives (From admission, onward)   None      Lab Results:  Recent Labs     06/19/17 2315 06/19/17 2336 06/20/17 0351  WBC 11.2*  --  10.3  HGB 10.1* 10.5* 9.0*  HCT 31.5* 31.0* 28.9*  PLT 272  --  211   BMET Recent Labs    06/19/17 2315 06/19/17 2336 06/20/17 0351  NA 134* 137 136  K 3.9 3.9 4.1  CL 97* 95* 102  CO2 28  --  26  GLUCOSE 146* 147* 134*  BUN 20 20 18   CREATININE 1.28* 1.30* 1.08*  CALCIUM 8.3*  --  8.1*   PT/INR Recent Labs    06/19/17 2315  LABPROT 24.9*  INR 2.28   CMP     Component Value Date/Time   NA 136 06/20/2017 0351   K 4.1 06/20/2017 0351   CL 102 06/20/2017 0351   CO2 26 06/20/2017 0351   GLUCOSE 134 (H) 06/20/2017 0351   GLUCOSE 104 (H) 03/11/2006 1059   BUN 18 06/20/2017 0351   CREATININE 1.08 (H) 06/20/2017 0351   CREATININE 0.91 03/16/2016 1539   CALCIUM 8.1 (L) 06/20/2017 0351   PROT 6.7 06/19/2017 2315   ALBUMIN 3.1 (L) 06/19/2017 2315   AST 225 (H) 06/19/2017 2315   ALT 315 (H) 06/19/2017 2315   ALKPHOS 351 (H) 06/19/2017 2315   BILITOT 0.8 06/19/2017 2315   GFRNONAA 50 (L) 06/20/2017 0351   GFRAA 58 (L) 06/20/2017 0351   Lipase     Component Value Date/Time   LIPASE 8 (L) 12/11/2011 1455  Studies/Results: Ct Head Wo Contrast  Result Date: 06/20/2017 CLINICAL DATA:  Headache, trouble walking weakness EXAM: CT HEAD WITHOUT CONTRAST TECHNIQUE: Contiguous axial images were obtained from the base of the skull through the vertex without intravenous contrast. COMPARISON:  05/31/2017, 06/04/2011 FINDINGS: Brain: No acute territorial infarction, hemorrhage or intracranial mass is visualized. Stable ventricle size. Stable calcified left frontal dural nodule. Mild small vessel ischemic changes of the white matter. Vascular: No hyperdense vessels. Scattered calcifications at the carotid siphons Skull: Normal. Negative for fracture or focal lesion. Sinuses/Orbits: Retention cysts in the left sphenoid sinus. Mild mucosal thickening in the ethmoid sinuses. No acute orbital abnormality. Other: None  IMPRESSION: 1. No CT evidence for acute intracranial abnormality. 2. Mild atrophy and small vessel ischemic changes of the white matter. Electronically Signed   By: Donavan Foil M.D.   On: 06/20/2017 00:14   Dg Chest Portable 1 View  Result Date: 06/20/2017 CLINICAL DATA:  Hypotension EXAM: PORTABLE CHEST 1 VIEW COMPARISON:  03/21/2017 FINDINGS: Unchanged position of left chest wall 3 lead AICD leads. Chronic interstitial opacities are unchanged. No new area of consolidation. No pleural effusion or pneumothorax. IMPRESSION: Unchanged appearance of chronic interstitial opacities, likely secondary to chronic mycobacterial infection. Electronically Signed   By: Ulyses Jarred M.D.   On: 06/20/2017 01:15   US Abdomen Limited Ruq  Result Date: 06/20/2017 CLINICAL DATA:  Elevated LFTs EXAM: ULTRASOUND ABDOMEN LIMITED RIGHT UPPER QUADRANT COMPARISON:  None. FINDINGS: Gallbladder: Large echogenic focus is noted in well distended gallbladder. Mild posterior acoustical shadowing is noted in this may represent a prominent 3 cm gallstone. Possibility of a tumefactive sludge ball would deserve consideration as well. Mild gallbladder sludge is noted. No wall thickening or pericholecystic fluid is noted. Common bile duct: Diameter: 9.6 mm. This is slightly prominent for the patient's given age and associated with mild intrahepatic ductal dilatation. Liver: No focal mass lesion is noted. Mild biliary ductal dilatation is noted. Portal vein is patent on color Doppler imaging with normal direction of blood flow towards the liver. IMPRESSION: Changes of biliary dilatation of both the intrahepatic and extrahepatic tree. This raises suspicion for distal common bile duct stone. MRCP or ERCP may be helpful for further evaluation. Gallbladder sludge and large echogenic focus within the gallbladder. This may represent a focus of tumefactive sludge or prominent gallstone. The degree of posterior acoustical shadowing is somewhat  decreased Electronically Signed   By: Inez Catalina M.D.   On: 06/20/2017 15:15      Kalman Drape , [facility] 06/21/2017, 8:17 AM  Pager: [phone] Mon-Wed, Friday 7:00am-4:30pm Thurs 7am-11:30am  Consults: [phone]

## 2017-06-21 NOTE — Progress Notes (Signed)
I spoke to the patient and her husband about the results of the HIDA scan and the possible reasons for the test being positive to include CBD stone, pancreatic mass or CBD mass. She cannot get an MRCP due to pace maker. I have consulted GI to see pt for possible ERCP to determine if this is a CBD stone or possible mass.   Jackson Latino, [facility] Pager [phone]

## 2017-06-21 NOTE — Progress Notes (Signed)
Results called to Dr. Ewing Schlein regarding hide a scan. Dr. Teryl Lucy request that this scriber call PA for surgery and notify her of test results.

## 2017-06-22 LAB — COMPREHENSIVE METABOLIC PANEL
ALT: 651 U/L — ABNORMAL HIGH (ref 14–54)
AST: 586 U/L — ABNORMAL HIGH (ref 15–41)
Albumin: 2.8 g/dL — ABNORMAL LOW (ref 3.5–5.0)
Alkaline Phosphatase: 557 U/L — ABNORMAL HIGH (ref 38–126)
Anion gap: 10 (ref 5–15)
BUN: 12 mg/dL (ref 6–20)
CO2: 22 mmol/L (ref 22–32)
Calcium: 8.6 mg/dL — ABNORMAL LOW (ref 8.9–10.3)
Chloride: 106 mmol/L (ref 101–111)
Creatinine, Ser: 0.75 mg/dL (ref 0.44–1.00)
GFR calc Af Amer: >60 mL/min (ref >60)
GFR calc non Af Amer: >60 mL/min (ref >60)
Glucose, Bld: 89 mg/dL (ref 65–99)
Potassium: 4.2 mmol/L (ref 3.5–5.1)
Sodium: 138 mmol/L (ref 135–145)
Total Bilirubin: 1 mg/dL (ref 0.3–1.2)
Total Protein: 6.4 g/dL — ABNORMAL LOW (ref 6.5–8.1)

## 2017-06-22 LAB — MITOCHONDRIAL ANTIBODIES: Mitochondrial M2 Ab, IgG: <20 Units (ref 0.0–20.0)

## 2017-06-22 LAB — CANCER ANTIGEN 19-9: CA 19-9: 1 U/mL (ref 0–35)

## 2017-06-22 LAB — APTT: aPTT: 60 seconds — ABNORMAL HIGH (ref 24–36)

## 2017-06-22 LAB — HEPARIN LEVEL (UNFRACTIONATED): Heparin Unfractionated: 1.04 IU/mL — ABNORMAL HIGH (ref 0.30–0.70)

## 2017-06-22 MED ORDER — LISINOPRIL 10 MG PO TABS: 10.0000 mg | ORAL_TABLET | Freq: Every day | ORAL | Status: DC

## 2017-06-22 MED ORDER — OXYCODONE HCL 5 MG PO TABS
10.0000 mg | ORAL_TABLET | Freq: Four times a day (QID) | ORAL | Status: DC | PRN
  Administered 2017-06-22 – 2017-06-27 (×19): 10 mg via ORAL
  Filled 2017-06-22 (×18): qty 2

## 2017-06-22 MED ORDER — FUROSEMIDE 40 MG PO TABS
40.0000 mg | ORAL_TABLET | Freq: Every morning | ORAL | Status: DC
  Administered 2017-06-22 – 2017-07-01 (×9): 40 mg via ORAL
  Filled 2017-06-22 (×10): qty 1

## 2017-06-22 MED ORDER — HEPARIN BOLUS VIA INFUSION
2250.0000 [IU] | Freq: Once | INTRAVENOUS | Status: AC
  Administered 2017-06-22: 2250 [IU] via INTRAVENOUS
  Filled 2017-06-22: qty 2250

## 2017-06-22 MED ORDER — HEPARIN (PORCINE) IN NACL 100-0.45 UNIT/ML-% IJ SOLN
750.0000 [IU]/h | INTRAMUSCULAR | Status: DC
  Administered 2017-06-22: 650 [IU]/h via INTRAVENOUS
  Filled 2017-06-22: qty 250

## 2017-06-22 NOTE — Progress Notes (Signed)
Subjective: She is doing better but still has mild diffuse abdominal pain.  Getting ready for ERCP by Dr. Benson Norway tomorrow She is is scheduled for ERCP with Dr. Benson Norway . tomorrow  Lab work shows AST and ALT still 500-600 range but better.  Bilirubin not reported today.  Alkaline phosphatase 557.  Objective: Vital signs in last 24 hours: Temp:  [97.6 F (36.4 C)-98.3 F (36.8 C)] 98 F (36.7 C) (04/06 1159) Pulse Rate:  [61-86] 61 (04/06 1159) Resp:  [18-20] 18 (04/06 0338) BP: (145-174)/(70-80) 145/70 (04/06 1159) SpO2:  [95 %-100 %] 98 % (04/06 1159) Weight:  [46.3 kg (102 lb 1.6 oz)] 46.3 kg (102 lb 1.6 oz) (04/06 0338) Last BM Date: 06/21/17  Intake/Output from previous day: 04/05 0701 - 04/06 0700 In: 0973 [P.O.:240; I.V.:1050] Out: 2200 [Urine:2200] Intake/Output this shift: Total I/O In: -  Out: 500 [Urine:500]  General appearance: Alert.  Very pleasant.  No distress.  Mental status normal. Resp: clear to auscultation bilaterally GI: Soft.  Nondistended.  Well-healed midline incision.  Well-healed Pfannenstiel incision mild diffuse tenderness without any significant guarding.  Nonlocalizing.  Lab Results:  Recent Labs    06/20/17 0351 06/21/17 0805  WBC 10.3 5.9  HGB 9.0* 9.3*  HCT 28.9* 29.6*  PLT 211 217   BMET Recent Labs    06/21/17 1624 06/22/17 0915  NA 136 138  K 4.3 4.2  CL 104 106  CO2 22 22  GLUCOSE 115* 89  BUN 11 12  CREATININE 0.67 0.75  CALCIUM 8.7* 8.6*   PT/INR Recent Labs    06/19/17 2315  LABPROT 24.9*  INR 2.28   ABG No results for input(s): PHART, HCO3 in the last 72 hours.  Invalid input(s): PCO2, PO2  Studies/Results: Nm Hepatobiliary Liver Func  Result Date: 06/21/2017 CLINICAL DATA:  Patient with elevated LFTs, cholelithiasis and biliary ductal dilatation. EXAM: NUCLEAR MEDICINE HEPATOBILIARY IMAGING TECHNIQUE: Sequential images of the abdomen were obtained out to 60 minutes following intravenous administration of  radiopharmaceutical. RADIOPHARMACEUTICALS:  6.70 mCi Tc-19m  Choletec IV COMPARISON:  Right upper quadrant ultrasound 06/20/2017 FINDINGS: Prompt uptake and biliary excretion of activity by the liver is seen. No activity is demonstrated within the gallbladder or small bowel. IMPRESSION: No radiotracer activity demonstrated within the gallbladder or small bowel compatible with biliary obstruction which may be secondary to common bile duct stone or potentially pancreatic mass/distal CBD tumor. Recommend further evaluation with MRI/MRCP. These results will be called to the ordering clinician or representative by the Radiologist Assistant, and communication documented in the PACS or zVision Dashboard. Electronically Signed   By: Lovey Newcomer M.D.   On: 06/21/2017 12:39   Ct Abdomen Pelvis W Contrast  Result Date: 06/22/2017 CLINICAL DATA:  Abnormal liver function tests EXAM: CT ABDOMEN AND PELVIS WITHOUT AND WITH CONTRAST TECHNIQUE: Multidetector CT imaging of the abdomen and pelvis was performed using the standard protocol following bolus administration of intravenous contrast. CONTRAST:  121mL ISOVUE-300 IOPAMIDOL (ISOVUE-300) INJECTION 61% COMPARISON:  07/29/2014 CT FINDINGS: Lower chest: Coronary sinus, right atrial and right ventricular pacing leads are noted. Heart is within normal limits for size. No pericardial effusion. Minimal subpleural areas of reticulation pulmonary opacities are noted, nonspecific but may reflect post inflammatory postinfectious change. Mild peribronchial thickening is noted to both lower lobes. Minimal subpleural scarring is seen in the right middle lobe. No effusion or pneumothorax. Hepatobiliary: Pericholecystic fluid is noted with gallbladder thickening and dependent gallstones as well as biliary sludge noted. Findings would be consistent  with an acute cholecystitis. The liver is unremarkable. Mild intra and extrahepatic ductal dilatation with soft tissue debris in the distal common  bile duct near the ampulla is noted. Pancreas: No pancreatic ductal dilatation or mass.  Inflammation. Spleen: Mild splenomegaly with new 11 mm circumscribed hypodensity within the spleen that may reflect an acquired cyst. Adrenals/Urinary Tract: Normal bilateral adrenal glands. Mild renal cortical thinning on the left with scarring in the lower pole. A tiny too small to characterize cyst is seen in the lower pole of the left kidney. No nephrolithiasis. Chronic mild caliectasis of the right renal collecting system and right-sided renal pelvis. The urinary bladder is physiologically distended without focal mural thickening. Stomach/Bowel: Decompressed stomach. Enteric contrast noted within small bowel with contrast reaching the cecum. No bowel obstruction or inflammation. Large amount of retained stool in the rectosigmoid. Normal-appearing appendix. Vascular/Lymphatic: Moderate aortoiliac atherosclerosis without aneurysm. No lymphadenopathy. Reproductive: Status post hysterectomy. No adnexal masses. Other: No free air nor free fluid. Musculoskeletal: Right hip arthroplasty. Sacral neural stimulating device. IMPRESSION: 1. Abnormal appearing gallbladder with biliary sludge a calculi. Gallbladder wall thickening pericholecystic fluid raise concern for cholecystitis. There is mild intra and extrahepatic ductal dilatation soft tissue densities in the distal common bowel duct that may reflect biliary sludge/debris. 2. Unremarkable CT appearance of the pancreas. 3. Mild peribronchial thickening with right greater than left lower lobe subpleural pulmonary opacities and reticulations may reflect stigmata postinfectious or postinflammatory change. 4. New 11 mm hypodensity in the spleen may reflect an acquired cyst. There is splenomegaly. 5. Mild bilateral renal cortical thinning with scarring in the left pole. Tiny too small to characterize cyst lower pole the left kidney is also noted. 6. Moderate aortoiliac atherosclerosis.   No lymphadenopathy. Electronically Signed   By: Ashley Royalty M.D.   On: 06/22/2017 01:06   US Abdomen Limited Ruq  Result Date: 06/20/2017 CLINICAL DATA:  Elevated LFTs EXAM: ULTRASOUND ABDOMEN LIMITED RIGHT UPPER QUADRANT COMPARISON:  None. FINDINGS: Gallbladder: Large echogenic focus is noted in well distended gallbladder. Mild posterior acoustical shadowing is noted in this may represent a prominent 3 cm gallstone. Possibility of a tumefactive sludge ball would deserve consideration as well. Mild gallbladder sludge is noted. No wall thickening or pericholecystic fluid is noted. Common bile duct: Diameter: 9.6 mm. This is slightly prominent for the patient's given age and associated with mild intrahepatic ductal dilatation. Liver: No focal mass lesion is noted. Mild biliary ductal dilatation is noted. Portal vein is patent on color Doppler imaging with normal direction of blood flow towards the liver. IMPRESSION: Changes of biliary dilatation of both the intrahepatic and extrahepatic tree. This raises suspicion for distal common bile duct stone. MRCP or ERCP may be helpful for further evaluation. Gallbladder sludge and large echogenic focus within the gallbladder. This may represent a focus of tumefactive sludge or prominent gallstone. The degree of posterior acoustical shadowing is somewhat decreased Electronically Signed   By: Inez Catalina M.D.   On: 06/20/2017 15:15    Anti-infectives: Anti-infectives (From admission, onward)   None      Assessment/Plan: s/p Procedure(s): ENDOSCOPIC RETROGRADE CHOLANGIOPANCREATOGRAPHY (ERCP)  Possible choledocholithiasis  abnormal LFTs Abdominal pain, sometimes or UQ, sometimes diffuse  Abnormal HIDA scan.  Question of acute cholecystitis versus false positive  ERCP tomorrow and discuss thereafter No indication for acute surgical intervention.  LOS: 1 day    Adin Hector 06/22/2017

## 2017-06-22 NOTE — Progress Notes (Signed)
Subjective: No complaints.  Objective: Vital signs in last 24 hours: Temp:  [97.6 F (36.4 C)-98.3 F (36.8 C)] 97.6 F (36.4 C) (04/06 0338) Pulse Rate:  [66-86] 86 (04/06 0338) Resp:  [18-20] 18 (04/06 0338) BP: (155-174)/(73-80) 165/79 (04/06 0338) SpO2:  [95 %-100 %] 100 % (04/06 0338) Weight:  [46.3 kg (102 lb 1.6 oz)] 46.3 kg (102 lb 1.6 oz) (04/06 0338) Last BM Date: 06/20/17  Intake/Output from previous day: 04/05 0701 - 04/06 0700 In: 0539 [P.O.:240; I.V.:1050] Out: 2200 [Urine:2200] Intake/Output this shift: Total I/O In: -  Out: 500 [Urine:500]  General appearance: alert and no distress GI: RUQ pain  Lab Results: Recent Labs    06/19/17 2315 06/19/17 2336 06/20/17 0351 06/21/17 0805  WBC 11.2*  --  10.3 5.9  HGB 10.1* 10.5* 9.0* 9.3*  HCT 31.5* 31.0* 28.9* 29.6*  PLT 272  --  211 217   BMET Recent Labs    06/20/17 0351 06/21/17 0805 06/21/17 1624  NA 136 135 136  K 4.1 4.3 4.3  CL 102 104 104  CO2 26 22 22   GLUCOSE 134* 126* 115*  BUN 18 10 11   CREATININE 1.08* 0.70 0.67  CALCIUM 8.1* 8.8* 8.7*   LFT Recent Labs    06/21/17 1624  PROT 6.0*  ALBUMIN 2.7*  AST 1,257*  ALT 829*  ALKPHOS 617*  BILITOT 2.0*  BILIDIR 1.3*   PT/INR Recent Labs    06/19/17 2315  LABPROT 24.9*  INR 2.28   Hepatitis Panel No results for input(s): HEPBSAG, HCVAB, HEPAIGM, HEPBIGM in the last 72 hours. C-Diff No results for input(s): CDIFFTOX in the last 72 hours. Fecal Lactopherrin No results for input(s): FECLLACTOFRN in the last 72 hours.  Studies/Results: Nm Hepatobiliary Liver Func  Result Date: 06/21/2017 CLINICAL DATA:  Patient with elevated LFTs, cholelithiasis and biliary ductal dilatation. EXAM: NUCLEAR MEDICINE HEPATOBILIARY IMAGING TECHNIQUE: Sequential images of the abdomen were obtained out to 60 minutes following intravenous administration of radiopharmaceutical. RADIOPHARMACEUTICALS:  6.70 mCi Tc-109m  Choletec IV COMPARISON:  Right  upper quadrant ultrasound 06/20/2017 FINDINGS: Prompt uptake and biliary excretion of activity by the liver is seen. No activity is demonstrated within the gallbladder or small bowel. IMPRESSION: No radiotracer activity demonstrated within the gallbladder or small bowel compatible with biliary obstruction which may be secondary to common bile duct stone or potentially pancreatic mass/distal CBD tumor. Recommend further evaluation with MRI/MRCP. These results will be called to the ordering clinician or representative by the Radiologist Assistant, and communication documented in the PACS or zVision Dashboard. Electronically Signed   By: Lovey Newcomer M.D.   On: 06/21/2017 12:39   Ct Abdomen Pelvis W Contrast  Result Date: 06/22/2017 CLINICAL DATA:  Abnormal liver function tests EXAM: CT ABDOMEN AND PELVIS WITHOUT AND WITH CONTRAST TECHNIQUE: Multidetector CT imaging of the abdomen and pelvis was performed using the standard protocol following bolus administration of intravenous contrast. CONTRAST:  11mL ISOVUE-300 IOPAMIDOL (ISOVUE-300) INJECTION 61% COMPARISON:  07/29/2014 CT FINDINGS: Lower chest: Coronary sinus, right atrial and right ventricular pacing leads are noted. Heart is within normal limits for size. No pericardial effusion. Minimal subpleural areas of reticulation pulmonary opacities are noted, nonspecific but may reflect post inflammatory postinfectious change. Mild peribronchial thickening is noted to both lower lobes. Minimal subpleural scarring is seen in the right middle lobe. No effusion or pneumothorax. Hepatobiliary: Pericholecystic fluid is noted with gallbladder thickening and dependent gallstones as well as biliary sludge noted. Findings would be consistent with an acute  cholecystitis. The liver is unremarkable. Mild intra and extrahepatic ductal dilatation with soft tissue debris in the distal common bile duct near the ampulla is noted. Pancreas: No pancreatic ductal dilatation or mass.   Inflammation. Spleen: Mild splenomegaly with new 11 mm circumscribed hypodensity within the spleen that may reflect an acquired cyst. Adrenals/Urinary Tract: Normal bilateral adrenal glands. Mild renal cortical thinning on the left with scarring in the lower pole. A tiny too small to characterize cyst is seen in the lower pole of the left kidney. No nephrolithiasis. Chronic mild caliectasis of the right renal collecting system and right-sided renal pelvis. The urinary bladder is physiologically distended without focal mural thickening. Stomach/Bowel: Decompressed stomach. Enteric contrast noted within small bowel with contrast reaching the cecum. No bowel obstruction or inflammation. Large amount of retained stool in the rectosigmoid. Normal-appearing appendix. Vascular/Lymphatic: Moderate aortoiliac atherosclerosis without aneurysm. No lymphadenopathy. Reproductive: Status post hysterectomy. No adnexal masses. Other: No free air nor free fluid. Musculoskeletal: Right hip arthroplasty. Sacral neural stimulating device. IMPRESSION: 1. Abnormal appearing gallbladder with biliary sludge a calculi. Gallbladder wall thickening pericholecystic fluid raise concern for cholecystitis. There is mild intra and extrahepatic ductal dilatation soft tissue densities in the distal common bowel duct that may reflect biliary sludge/debris. 2. Unremarkable CT appearance of the pancreas. 3. Mild peribronchial thickening with right greater than left lower lobe subpleural pulmonary opacities and reticulations may reflect stigmata postinfectious or postinflammatory change. 4. New 11 mm hypodensity in the spleen may reflect an acquired cyst. There is splenomegaly. 5. Mild bilateral renal cortical thinning with scarring in the left pole. Tiny too small to characterize cyst lower pole the left kidney is also noted. 6. Moderate aortoiliac atherosclerosis.  No lymphadenopathy. Electronically Signed   By: Ashley Royalty M.D.   On: 06/22/2017 01:06    US Abdomen Limited Ruq  Result Date: 06/20/2017 CLINICAL DATA:  Elevated LFTs EXAM: ULTRASOUND ABDOMEN LIMITED RIGHT UPPER QUADRANT COMPARISON:  None. FINDINGS: Gallbladder: Large echogenic focus is noted in well distended gallbladder. Mild posterior acoustical shadowing is noted in this may represent a prominent 3 cm gallstone. Possibility of a tumefactive sludge ball would deserve consideration as well. Mild gallbladder sludge is noted. No wall thickening or pericholecystic fluid is noted. Common bile duct: Diameter: 9.6 mm. This is slightly prominent for the patient's given age and associated with mild intrahepatic ductal dilatation. Liver: No focal mass lesion is noted. Mild biliary ductal dilatation is noted. Portal vein is patent on color Doppler imaging with normal direction of blood flow towards the liver. IMPRESSION: Changes of biliary dilatation of both the intrahepatic and extrahepatic tree. This raises suspicion for distal common bile duct stone. MRCP or ERCP may be helpful for further evaluation. Gallbladder sludge and large echogenic focus within the gallbladder. This may represent a focus of tumefactive sludge or prominent gallstone. The degree of posterior acoustical shadowing is somewhat decreased Electronically Signed   By: Inez Catalina M.D.   On: 06/20/2017 15:15    Medications:  Scheduled: . apixaban  5 mg Oral BID  . carvedilol  25 mg Oral BID  . gabapentin  600 mg Oral TID  . iopamidol      . pantoprazole  40 mg Oral BID  . tiotropium  1 capsule Inhalation Daily   Continuous: . sodium chloride 75 mL/hr at 06/21/17 2000    Assessment/Plan: 1) Possible choledocholithiasis. 2) Abnormal liver enzymes. 3) RUQ pain. 4) Possible acute cholecystitis.   The CT scan is suggestive for some distal  CBD sludge/stones.  Her liver panel is pending this AM.  Overall she is stable, but there is pain with palpation of the RUQ and the CT scan reports an acute cholecystitis.  Plan: 1)  ERCP tomorrow. 2) Trial of clear liquids today.   LOS: 1 day   Shakhia Gramajo D 06/22/2017, 7:16 AM

## 2017-06-22 NOTE — Progress Notes (Addendum)
PROGRESS NOTE    [name]  XID:[id number] DOB: 1945/08/05 DOA: 06/19/2017 PCP: Ann Held, DO   Brief Narrative: [name] is a 72 y.o. female with medical history significant of hypertension, hyperlipidemia, COPD, bronchiectasis, GERD, atrial fibrillation on Eliquis, pacemaker placement, left bundle blockage, gastric ulcer, GI bleeding, dCHF, anemia, psoriasis, abnormal liver function. She presented with lightheadedness found to have hypotension. Now with elevated LFTs concerning for cholecystitis.   Assessment & Plan:   Principal Problem:   Weakness Active Problems:   ANEMIA-IRON DEFICIENCY   Essential hypertension   Atrial fibrillation/flutter   COPD with emphysema (HCC)   GERD (gastroesophageal reflux disease)   Chronic diastolic CHF (congestive heart failure) (HCC)   TIA (transient ischemic attack)   AKI (acute kidney injury) (HCC)   Abnormal LFTs   Hypotension   HLD (hyperlipidemia)   CAD (coronary artery disease)   Protein-calorie malnutrition, severe (HCC)   Elevated ALT measurement   Hypotension Resolved on arrival after IV fluids. Now hypertensive.  Essential hypertension Blood pressure high today. -Continue Coreg 25 mg BID -Restart home furosemide -If no improvement, will start lisinopril 10 mg daily  Weakness Likely secondary to hypotension.  Gallstones Elevated LFTs AST/ALT/Alk phos continue to climb, now with elevated bilirubin. RUQ ultrasound significant for cholelithiasis. Question of distal CBD stone. Discussed with GI on 4/4 who recommended surgery consult. HID significant for ?cholecystitis/cholecdocolithiasis with concern for ?pancreatic mass/tumor. CT scan significant for abnormal appearing gallbladder w/ concern for cholecystitis. -General surgery recommendations: ERCP on 4/7  Atrial fibrillation Stable. -Hold Eliquis -Start Heparin pending ERCP  COPD Stable. -Continue Spiriva and albuterol prn  Acute kidney  injury Resolved with IV fluids.  TIA On Eliquis and Lipitor as an outpatient.  Hyperlipidemia -Hold Lipitor in setting of acute liver injury  LBBB S/p pacemaker.  Severe protein calorie malnutrition  Chronic pain -Increase to oxycodone 10 mg (home dose)   DVT prophylaxis: Eliquis Code Status:   Code Status: Full Code Family Communication: Husband at bedside Disposition Plan: Discharge pending workup for possible acute cholecystitis   Consultants:   General surgery  Procedures:   None  Antimicrobials:  None    Subjective: Back pain worse today. Chronic.  Objective: Vitals:   06/21/17 1523 06/21/17 1955 06/22/17 0338 06/22/17 0949  BP: (!) 166/78 (!) 174/80 (!) 165/79   Pulse: 72 66 86   Resp: '18 18 18   ' Temp: 98.2 F (36.8 C) 98.1 F (36.7 C) 97.6 F (36.4 C)   TempSrc: Oral Oral Oral   SpO2: 97% 95% 100% 99%  Weight:   46.3 kg (102 lb 1.6 oz)   Height:        Intake/Output Summary (Last 24 hours) at 06/22/2017 0959 Last data filed at 06/22/2017 0705 Gross per 24 hour  Intake 1290 ml  Output 2700 ml  Net -1410 ml   Filed Weights   06/20/17 0523 06/21/17 0532 06/22/17 0338  Weight: 46.4 kg (102 lb 4.7 oz) 45.9 kg (101 lb 1.6 oz) 46.3 kg (102 lb 1.6 oz)    Examination:  General exam: Appears calm and comfortable Respiratory system: Diffuse crackles. Respiratory effort normal. Cardiovascular system: Regular rate and rhythm. Normal S1 and S2. No heart murmurs present. No extra heart sounds Gastrointestinal system: Abdomen is nondistended, soft and mildly tender in RUQ. No organomegaly or masses felt. Normal bowel sounds heard. Central nervous system: Alert, oriented. No focal deficit Extremities: no edema or calf tenderness Skin: No cyanosis. No rashes Psychiatry: Judgement  and insight appear normal. Mood & affect appropriate.     Data Reviewed: I have personally reviewed following labs and imaging studies  CBC: Recent Labs  Lab  06/19/17 2315 06/19/17 2336 06/20/17 0351 06/21/17 0805  WBC 11.2*  --  10.3 5.9  NEUTROABS 9.4*  --   --   --   HGB 10.1* 10.5* 9.0* 9.3*  HCT 31.5* 31.0* 28.9* 29.6*  MCV 91.0  --  90.6 89.4  PLT 272  --  211 811   Basic Metabolic Panel: Recent Labs  Lab 06/19/17 2315 06/19/17 2336 06/20/17 0351 06/21/17 0805 06/21/17 1624  NA 134* 137 136 135 136  K 3.9 3.9 4.1 4.3 4.3  CL 97* 95* 102 104 104  CO2 28  --  '26 22 22  ' GLUCOSE 146* 147* 134* 126* 115*  BUN '20 20 18 10 11  ' CREATININE 1.28* 1.30* 1.08* 0.70 0.67  CALCIUM 8.3*  --  8.1* 8.8* 8.7*   GFR: Estimated Creatinine Clearance: 45.7 mL/min (by C-G formula based on SCr of 0.67 mg/dL). Liver Function Tests: Recent Labs  Lab 06/19/17 2315 06/21/17 0805 06/21/17 1624  AST 225* 1,237* 1,257*  ALT 315* 783* 829*  ALKPHOS 351* 671* 617*  BILITOT 0.8 2.5* 2.0*  PROT 6.7 6.4* 6.0*  ALBUMIN 3.1* 2.9* 2.7*   No results for input(s): LIPASE, AMYLASE in the last 168 hours. No results for input(s): AMMONIA in the last 168 hours. Coagulation Profile: Recent Labs  Lab 06/19/17 2315  INR 2.28   Cardiac Enzymes: No results for input(s): CKTOTAL, CKMB, CKMBINDEX, TROPONINI in the last 168 hours. BNP (last 3 results) No results for input(s): PROBNP in the last 8760 hours. HbA1C: No results for input(s): HGBA1C in the last 72 hours. CBG: No results for input(s): GLUCAP in the last 168 hours. Lipid Profile: No results for input(s): CHOL, HDL, LDLCALC, TRIG, CHOLHDL, LDLDIRECT in the last 72 hours. Thyroid Function Tests: No results for input(s): TSH, T4TOTAL, FREET4, T3FREE, THYROIDAB in the last 72 hours. Anemia Panel: No results for input(s): VITAMINB12, FOLATE, FERRITIN, TIBC, IRON, RETICCTPCT in the last 72 hours. Sepsis Labs: Recent Labs  Lab 06/20/17 0351  PROCALCITON 0.70    Recent Results (from the past 240 hour(s))  Culture, blood (Routine X 2) w Reflex to ID Panel     Status: None (Preliminary result)    Collection Time: 06/20/17  3:51 AM  Result Value Ref Range Status   Specimen Description BLOOD LEFT HAND  Final   Special Requests IN PEDIATRIC BOTTLE Blood Culture adequate volume  Final   Culture   Final    NO GROWTH 1 DAY Performed at [facility], [place] [address], [place], [place] [address]    Report Status PENDING  Incomplete  Culture, blood (Routine X 2) w Reflex to ID Panel     Status: None (Preliminary result)   Collection Time: 06/20/17  3:51 AM  Result Value Ref Range Status   Specimen Description BLOOD RIGHT HAND  Final   Special Requests   Final    BOTTLES DRAWN AEROBIC AND ANAEROBIC Blood Culture adequate volume   Culture   Final    NO GROWTH 1 DAY Performed at [facility], [place] [address], [place], [place] [address]    Report Status PENDING  Incomplete         Radiology Studies: Nm Hepatobiliary Liver Func  Result Date: 06/21/2017 CLINICAL DATA:  Patient with elevated LFTs, cholelithiasis and biliary ductal dilatation. EXAM:  NUCLEAR MEDICINE HEPATOBILIARY IMAGING TECHNIQUE: Sequential images of the abdomen were obtained out to 60 minutes following intravenous administration of radiopharmaceutical. RADIOPHARMACEUTICALS:  6.70 mCi Tc-36m Choletec IV COMPARISON:  Right upper quadrant ultrasound 06/20/2017 FINDINGS: Prompt uptake and biliary excretion of activity by the liver is seen. No activity is demonstrated within the gallbladder or small bowel. IMPRESSION: No radiotracer activity demonstrated within the gallbladder or small bowel compatible with biliary obstruction which may be secondary to common bile duct stone or potentially pancreatic mass/distal CBD tumor. Recommend further evaluation with MRI/MRCP. These results will be called to the ordering clinician or representative by the Radiologist Assistant, and communication documented in the PACS or zVision Dashboard. Electronically Signed   By: DLovey NewcomerM.D.   On: 06/21/2017 12:39   Ct Abdomen Pelvis W  Contrast  Result Date: 06/22/2017 CLINICAL DATA:  Abnormal liver function tests EXAM: CT ABDOMEN AND PELVIS WITHOUT AND WITH CONTRAST TECHNIQUE: Multidetector CT imaging of the abdomen and pelvis was performed using the standard protocol following bolus administration of intravenous contrast. CONTRAST:  101mISOVUE-300 IOPAMIDOL (ISOVUE-300) INJECTION 61% COMPARISON:  07/29/2014 CT FINDINGS: Lower chest: Coronary sinus, right atrial and right ventricular pacing leads are noted. Heart is within normal limits for size. No pericardial effusion. Minimal subpleural areas of reticulation pulmonary opacities are noted, nonspecific but may reflect post inflammatory postinfectious change. Mild peribronchial thickening is noted to both lower lobes. Minimal subpleural scarring is seen in the right middle lobe. No effusion or pneumothorax. Hepatobiliary: Pericholecystic fluid is noted with gallbladder thickening and dependent gallstones as well as biliary sludge noted. Findings would be consistent with an acute cholecystitis. The liver is unremarkable. Mild intra and extrahepatic ductal dilatation with soft tissue debris in the distal common bile duct near the ampulla is noted. Pancreas: No pancreatic ductal dilatation or mass.  Inflammation. Spleen: Mild splenomegaly with new 11 mm circumscribed hypodensity within the spleen that may reflect an acquired cyst. Adrenals/Urinary Tract: Normal bilateral adrenal glands. Mild renal cortical thinning on the left with scarring in the lower pole. A tiny too small to characterize cyst is seen in the lower pole of the left kidney. No nephrolithiasis. Chronic mild caliectasis of the right renal collecting system and right-sided renal pelvis. The urinary bladder is physiologically distended without focal mural thickening. Stomach/Bowel: Decompressed stomach. Enteric contrast noted within small bowel with contrast reaching the cecum. No bowel obstruction or inflammation. Large amount of  retained stool in the rectosigmoid. Normal-appearing appendix. Vascular/Lymphatic: Moderate aortoiliac atherosclerosis without aneurysm. No lymphadenopathy. Reproductive: Status post hysterectomy. No adnexal masses. Other: No free air nor free fluid. Musculoskeletal: Right hip arthroplasty. Sacral neural stimulating device. IMPRESSION: 1. Abnormal appearing gallbladder with biliary sludge a calculi. Gallbladder wall thickening pericholecystic fluid raise concern for cholecystitis. There is mild intra and extrahepatic ductal dilatation soft tissue densities in the distal common bowel duct that may reflect biliary sludge/debris. 2. Unremarkable CT appearance of the pancreas. 3. Mild peribronchial thickening with right greater than left lower lobe subpleural pulmonary opacities and reticulations may reflect stigmata postinfectious or postinflammatory change. 4. New 11 mm hypodensity in the spleen may reflect an acquired cyst. There is splenomegaly. 5. Mild bilateral renal cortical thinning with scarring in the left pole. Tiny too small to characterize cyst lower pole the left kidney is also noted. 6. Moderate aortoiliac atherosclerosis.  No lymphadenopathy. Electronically Signed   By: DaAshley Royalty.D.   On: 06/22/2017 01:06   UsKoreabdomen Limited Ruq  Result Date: 06/20/2017 CLINICAL  DATA:  Elevated LFTs EXAM: ULTRASOUND ABDOMEN LIMITED RIGHT UPPER QUADRANT COMPARISON:  None. FINDINGS: Gallbladder: Large echogenic focus is noted in well distended gallbladder. Mild posterior acoustical shadowing is noted in this may represent a prominent 3 cm gallstone. Possibility of a tumefactive sludge ball would deserve consideration as well. Mild gallbladder sludge is noted. No wall thickening or pericholecystic fluid is noted. Common bile duct: Diameter: 9.6 mm. This is slightly prominent for the patient's given age and associated with mild intrahepatic ductal dilatation. Liver: No focal mass lesion is noted. Mild biliary ductal  dilatation is noted. Portal vein is patent on color Doppler imaging with normal direction of blood flow towards the liver. IMPRESSION: Changes of biliary dilatation of both the intrahepatic and extrahepatic tree. This raises suspicion for distal common bile duct stone. MRCP or ERCP may be helpful for further evaluation. Gallbladder sludge and large echogenic focus within the gallbladder. This may represent a focus of tumefactive sludge or prominent gallstone. The degree of posterior acoustical shadowing is somewhat decreased Electronically Signed   By: Inez Catalina M.D.   On: 06/20/2017 15:15        Scheduled Meds: . carvedilol  25 mg Oral BID  . gabapentin  600 mg Oral TID  . heparin  2,250 Units Intravenous Once  . pantoprazole  40 mg Oral BID  . tiotropium  1 capsule Inhalation Daily   Continuous Infusions: . sodium chloride 75 mL/hr at 06/21/17 2000  . heparin       LOS: 1 day     Cordelia Poche, MD [facility] 06/22/2017, 9:59 AM Pager: [phone]  If 7PM-7AM, please contact night-coverage [email or web address] Password [email or web address] 06/22/2017, 9:59 AM

## 2017-06-22 NOTE — Progress Notes (Signed)
ANTICOAGULATION CONSULT NOTE - Initial Consult  Pharmacy Consult for Heparin Indication: atrial fibrillation  Allergies  Allergen Reactions  . Dabigatran Etexilate Mesylate Other (See Comments)    INTERNAL BLEEDING-pradaxa  . Talwin [Pentazocine] Other (See Comments)    hallucinations  . Lactate Rash    Patient Measurements: Height: 5' (152.4 cm) Weight: 102 lb 1.6 oz (46.3 kg)(scale b) IBW/kg (Calculated) : 45.5 Heparin Dosing Weight: 46kg  Vital Signs: Temp: 97.6 F (36.4 C) (04/06 0338) Temp Source: Oral (04/06 0338) BP: 165/79 (04/06 0338) Pulse Rate: 86 (04/06 0338)  Labs: Recent Labs    06/19/17 2315 06/19/17 2336 06/20/17 0351 06/21/17 0805 06/21/17 1624  HGB 10.1* 10.5* 9.0* 9.3*  --   HCT 31.5* 31.0* 28.9* 29.6*  --   PLT 272  --  211 217  --   APTT 69*  --   --   --   --   LABPROT 24.9*  --   --   --   --   INR 2.28  --   --   --   --   CREATININE 1.28* 1.30* 1.08* 0.70 0.67    Estimated Creatinine Clearance: 45.7 mL/min (by C-G formula based on SCr of 0.67 mg/dL).   Medical History: Past Medical History:  Diagnosis Date  . Anemia   . Anginal pain ([place])   . Arthritis    right hip  . Atrial fibrillation ([place])   . Blood transfusion   . CAD (coronary artery disease)    mild disease per cath in 2011  . CHF (congestive heart failure) ([place])   . Chronic abdominal pain   . Chronic nausea   . Chronic pain syndrome   . COPD (chronic obstructive pulmonary disease) (HCC)    continuous O2- 2l  . Diverticulitis   . Elbow fracture, left aug 2012  . Elevated liver enzymes   . Fibromyalgia    COUPLE OF TIMES A YEAR-PT HAS EPISODES OF CONFUSION-USUALLY INVOLVES DAY/NIGHT REVERSAL AND EPISODES OF TWITCHING AND FALLS--SEES DR. Jacelyn Grip - NEUROLOGIST-LAST SEEN 07/10/11  . Gastroparesis   . GERD (gastroesophageal reflux disease)   . H/O: GI bleed    from Pradaxa  . Hepatitis    hx of in high school   . HTN (hypertension)    under control; has been on med. x  "years"  . Hx MRSA infection   . Hx of gastric ulcer   . Hx of stress fx aug 2011   right hip   . LBBB (left bundle branch block)   . Oxygen dependent    2 liters via nasal cannula at all times  . Pacemaker    CRT therapy; followed by Dr. Caryl Comes  . Pleural effusion     s/p right thoracentesis 03/09  . Pneumonia - 03/09, 12/09, 02/10, 06/10   MOST RECENT FEB 2013  . Primary dilated cardiomyopathy (HCC)    EF 45 to 50% per echo in Jan 2012  . RLS (restless legs syndrome)   . Silent aspiration   . Small bowel obstruction ([place])   . Urinary incontinence     -INTERSTIM IMPLANT NOT FUNCTIONING PER PT  . Venous embolism and thrombosis of subclavian vein ([place])    after pacemaker insertion Oct 2011  . Vitamin B12 deficiency     Assessment: 72 yo female w/ recent TIA, readmitted for recurrent symptoms. Also, noted to have abnormal LFTs AST 1257, ALT 829, renal function normal. CBC low, stable. No bleeding noted. Patient scheduled for ERCP tomorrow.  Pharmacy consulted for heparin dosing for atrial fibrillation.    CHADSVASc = 6 - PTA eliquis: last dose 4/5 @1230 . Will monitor aPTT and Heparin Levels until correlation.  Goal of Therapy:  Heparin level 0.3-0.7 units/ml aPTT 66-102 seconds Monitor platelets by anticoagulation protocol: Yes   Plan:  Heparin bolus 2250 units Heparin 650 units/hr 8 hour aPTT and heparin levels Daily heparin level, CBC Monitor clinical course, s/sx bleeding  Nida Boatman, PharmD PGY1 Acute Care Pharmacy Resident Pager: [phone] 06/22/2017,9:10 AM

## 2017-06-22 NOTE — Progress Notes (Signed)
[place] for Heparin Indication: atrial fibrillation  Allergies  Allergen Reactions  . Dabigatran Etexilate Mesylate Other (See Comments)    INTERNAL BLEEDING-pradaxa  . Talwin [Pentazocine] Other (See Comments)    hallucinations  . Lactate Rash    Patient Measurements: Height: 5' (152.4 cm) Weight: 102 lb 1.6 oz (46.3 kg)(scale b) IBW/kg (Calculated) : 45.5 Heparin Dosing Weight: 46kg  Vital Signs: Temp: 98 F (36.7 C) (04/06 1159) BP: 145/70 (04/06 1159) Pulse Rate: 61 (04/06 1159)  Labs: Recent Labs    06/19/17 2315 06/19/17 2336 06/20/17 0351 06/21/17 0805 06/21/17 1624 06/22/17 0915 06/22/17 1743  HGB 10.1* 10.5* 9.0* 9.3*  --   --   --   HCT 31.5* 31.0* 28.9* 29.6*  --   --   --   PLT 272  --  211 217  --   --   --   APTT 69*  --   --   --   --   --  60*  LABPROT 24.9*  --   --   --   --   --   --   INR 2.28  --   --   --   --   --   --   HEPARINUNFRC  --   --   --   --   --   --  1.04*  CREATININE 1.28* 1.30* 1.08* 0.70 0.67 0.75  --     Estimated Creatinine Clearance: 45.7 mL/min (by C-G formula based on SCr of 0.75 mg/dL).   Medical History: Past Medical History:  Diagnosis Date  . Anemia   . Anginal pain ([place])   . Arthritis    right hip  . Atrial fibrillation ([place])   . Blood transfusion   . CAD (coronary artery disease)    mild disease per cath in 2011  . CHF (congestive heart failure) ([place])   . Chronic abdominal pain   . Chronic nausea   . Chronic pain syndrome   . COPD (chronic obstructive pulmonary disease) (H[facility])    continuous O2- 2l  . Diverticulitis   . Elbow fracture, left aug 2012  . Elevated liver enzymes   . Fibromyalgia    COUPLE OF TIMES A YEAR-PT HAS EPISODES OF CONFUSION-USUALLY INVOLVES DAY/NIGHT REVERSAL AND EPISODES OF TWITCHING AND FALLS--SEES DR. Jacelyn Grip - NEUROLOGIST-LAST SEEN 07/10/11  . Gastroparesis   . GERD (gastroesophageal reflux disease)   . H/O: GI bleed    from Pradaxa  .  Hepatitis    hx of in high school   . HTN (hypertension)    under control; has been on med. x "years"  . Hx MRSA infection   . Hx of gastric ulcer   . Hx of stress fx aug 2011   right hip   . LBBB (left bundle branch block)   . Oxygen dependent    2 liters via nasal cannula at all times  . Pacemaker    CRT therapy; followed by Dr. Caryl Comes  . Pleural effusion     s/p right thoracentesis 03/09  . Pneumonia - 03/09, 12/09, 02/10, 06/10   MOST RECENT FEB 2013  . Primary dilated cardiomyopathy (HCC)    EF 45 to 50% per echo in Jan 2012  . RLS (restless legs syndrome)   . Silent aspiration   . Small bowel obstruction ([place])   . Urinary incontinence     -INTERSTIM IMPLANT NOT FUNCTIONING PER PT  . Venous embolism and thrombosis of  subclavian vein [facility])    after pacemaker insertion Oct 2011  . Vitamin B12 deficiency     Assessment: 72 yo female w/ recent TIA, readmitted for recurrent symptoms.  Patient scheduled for ERCP tomorrow. Pharmacy consulted for heparin dosing for atrial fibrillation. She is noted on  eliquis PTA: last dose 4/5 @1230 .  -heparin level= 1.04, aPTT= 60  Goal of Therapy:  Heparin level 0.3-0.7 units/ml aPTT 66-102 seconds Monitor platelets by anticoagulation protocol: Yes   Plan:  Increase heparin to 750 units/hr Daily heparin level, CBC  Hildred Laser, PharmD Clinical Pharmacist 06/22/2017 7:53 PM

## 2017-06-23 ENCOUNTER — Inpatient Hospital Stay (HOSPITAL_COMMUNITY): Payer: PPO | Admitting: Anesthesiology

## 2017-06-23 ENCOUNTER — Encounter (HOSPITAL_COMMUNITY)
Admission: EM | Disposition: A | Discharge status: Home / Self Care | Payer: Self-pay | Source: Home / Self Care | Attending: Internal Medicine

## 2017-06-23 ENCOUNTER — Encounter (HOSPITAL_COMMUNITY): Payer: Self-pay | Admitting: Gastroenterology

## 2017-06-23 ENCOUNTER — Inpatient Hospital Stay (HOSPITAL_COMMUNITY): Payer: PPO

## 2017-06-23 HISTORY — PX: ERCP: SHX5425

## 2017-06-23 LAB — COMPREHENSIVE METABOLIC PANEL
ALT: 434 U/L — ABNORMAL HIGH (ref 14–54)
AST: 171 U/L — ABNORMAL HIGH (ref 15–41)
Albumin: 2.8 g/dL — ABNORMAL LOW (ref 3.5–5.0)
Alkaline Phosphatase: 467 U/L — ABNORMAL HIGH (ref 38–126)
Anion gap: 12 (ref 5–15)
BUN: 7 mg/dL (ref 6–20)
CO2: 22 mmol/L (ref 22–32)
Calcium: 8.3 mg/dL — ABNORMAL LOW (ref 8.9–10.3)
Chloride: 104 mmol/L (ref 101–111)
Creatinine, Ser: 0.75 mg/dL (ref 0.44–1.00)
GFR calc Af Amer: >60 mL/min (ref >60)
GFR calc non Af Amer: >60 mL/min (ref >60)
Glucose, Bld: 128 mg/dL — ABNORMAL HIGH (ref 65–99)
Potassium: 3.5 mmol/L (ref 3.5–5.1)
Sodium: 138 mmol/L (ref 135–145)
Total Bilirubin: 0.7 mg/dL (ref 0.3–1.2)
Total Protein: 6.4 g/dL — ABNORMAL LOW (ref 6.5–8.1)

## 2017-06-23 LAB — HEPARIN LEVEL (UNFRACTIONATED): Heparin Unfractionated: 0.1 IU/mL — ABNORMAL LOW (ref 0.30–0.70)

## 2017-06-23 LAB — APTT: aPTT: 34 seconds (ref 24–36)

## 2017-06-23 SURGERY — ERCP, WITH INTERVENTION IF INDICATED
Anesthesia: General

## 2017-06-23 MED ORDER — SUGAMMADEX SODIUM 200 MG/2ML IV SOLN
INTRAVENOUS | Status: DC | PRN
  Administered 2017-06-23: 100 mg via INTRAVENOUS

## 2017-06-23 MED ORDER — CIPROFLOXACIN IN D5W 400 MG/200ML IV SOLN
INTRAVENOUS | Status: DC | PRN
  Administered 2017-06-23: 400 mg via INTRAVENOUS

## 2017-06-23 MED ORDER — LIDOCAINE HCL (CARDIAC) 20 MG/ML IV SOLN
INTRAVENOUS | Status: DC | PRN
  Administered 2017-06-23: 60 mg via INTRAVENOUS

## 2017-06-23 MED ORDER — HYDRALAZINE HCL 20 MG/ML IJ SOLN
5.0000 mg | Freq: Four times a day (QID) | INTRAMUSCULAR | Status: DC | PRN
  Filled 2017-06-23: qty 1

## 2017-06-23 MED ORDER — ROCURONIUM BROMIDE 100 MG/10ML IV SOLN
INTRAVENOUS | Status: DC | PRN
  Administered 2017-06-23: 35 mg via INTRAVENOUS

## 2017-06-23 MED ORDER — IOPAMIDOL (ISOVUE-300) INJECTION 61%
INTRAVENOUS | Status: AC
  Filled 2017-06-23: qty 50

## 2017-06-23 MED ORDER — HEPARIN (PORCINE) IN NACL 100-0.45 UNIT/ML-% IJ SOLN
900.0000 [IU]/h | INTRAMUSCULAR | Status: DC
  Administered 2017-06-23 (×2): 750 [IU]/h via INTRAVENOUS
  Filled 2017-06-23: qty 250

## 2017-06-23 MED ORDER — FENTANYL CITRATE (PF) 100 MCG/2ML IJ SOLN
INTRAMUSCULAR | Status: DC | PRN
  Administered 2017-06-23 (×2): 25 ug via INTRAVENOUS
  Administered 2017-06-23: 50 ug via INTRAVENOUS

## 2017-06-23 MED ORDER — PHENYLEPHRINE 40 MCG/ML (10ML) SYRINGE FOR IV PUSH (FOR BLOOD PRESSURE SUPPORT)
PREFILLED_SYRINGE | INTRAVENOUS | Status: DC | PRN
  Administered 2017-06-23 (×3): 40 ug via INTRAVENOUS

## 2017-06-23 MED ORDER — PROMETHAZINE HCL 25 MG/ML IJ SOLN
12.5000 mg | Freq: Once | INTRAMUSCULAR | Status: AC
  Administered 2017-06-23: 12.5 mg via INTRAVENOUS
  Filled 2017-06-23: qty 1

## 2017-06-23 MED ORDER — INDOMETHACIN 50 MG RE SUPP
RECTAL | Status: AC
  Filled 2017-06-23: qty 1

## 2017-06-23 MED ORDER — SODIUM CHLORIDE 0.9 % IV SOLN: INTRAVENOUS | Status: DC

## 2017-06-23 MED ORDER — PROPOFOL 10 MG/ML IV BOLUS
INTRAVENOUS | Status: DC | PRN
  Administered 2017-06-23: 100 mg via INTRAVENOUS

## 2017-06-23 MED ORDER — OXYCODONE HCL 5 MG PO TABS
ORAL_TABLET | ORAL | Status: AC
  Administered 2017-06-23: 10 mg via ORAL
  Filled 2017-06-23: qty 2

## 2017-06-23 MED ORDER — LISINOPRIL 5 MG PO TABS
5.0000 mg | ORAL_TABLET | Freq: Every day | ORAL | Status: DC
  Administered 2017-06-23: 5 mg via ORAL
  Filled 2017-06-23: qty 1

## 2017-06-23 MED ORDER — DEXAMETHASONE SODIUM PHOSPHATE 4 MG/ML IJ SOLN
INTRAMUSCULAR | Status: DC | PRN
  Administered 2017-06-23: 4 mg via INTRAVENOUS

## 2017-06-23 MED ORDER — INDOMETHACIN 25 MG PO CAPS
ORAL_CAPSULE | ORAL | Status: DC | PRN
  Administered 2017-06-23: 100 mg via ORAL

## 2017-06-23 MED ORDER — EPHEDRINE SULFATE-NACL 50-0.9 MG/10ML-% IV SOSY
PREFILLED_SYRINGE | INTRAVENOUS | Status: DC | PRN
  Administered 2017-06-23: 10 mg via INTRAVENOUS

## 2017-06-23 MED ORDER — SODIUM CHLORIDE 0.9 % IJ SOLN
INTRAMUSCULAR | Status: DC | PRN
  Administered 2017-06-23: 1 mL

## 2017-06-23 MED ORDER — CIPROFLOXACIN IN D5W 400 MG/200ML IV SOLN
INTRAVENOUS | Status: AC
  Filled 2017-06-23: qty 200

## 2017-06-23 MED ORDER — EPINEPHRINE PF 1 MG/10ML IJ SOSY
PREFILLED_SYRINGE | INTRAMUSCULAR | Status: AC
  Filled 2017-06-23: qty 10

## 2017-06-23 NOTE — Progress Notes (Signed)
PROGRESS NOTE    [name]  PIR:[id number] DOB: 1945/10/01 DOA: 06/19/2017 PCP: Ann Held, DO   Brief Narrative: [name] is a 72 y.o. female with medical history significant of hypertension, hyperlipidemia, COPD, bronchiectasis, GERD, atrial fibrillation on Eliquis, pacemaker placement, left bundle blockage, gastric ulcer, GI bleeding, dCHF, anemia, psoriasis, abnormal liver function. She presented with lightheadedness found to have hypotension. Now with elevated LFTs concerning for cholecystitis.   Assessment & Plan:   Principal Problem:   Weakness Active Problems:   ANEMIA-IRON DEFICIENCY   Essential hypertension   Atrial fibrillation/flutter   COPD with emphysema (HCC)   GERD (gastroesophageal reflux disease)   Chronic diastolic CHF (congestive heart failure) (HCC)   TIA (transient ischemic attack)   AKI (acute kidney injury) (HCC)   Abnormal LFTs   Hypotension   HLD (hyperlipidemia)   CAD (coronary artery disease)   Protein-calorie malnutrition, severe (HCC)   Elevated ALT measurement   Hypotension Resolved on arrival after IV fluids. Now hypertensive.  Essential hypertension Blood pressure high today. -Continue Coreg 25 mg BID -Continue home furosemide -Start lisinopril 10 mg daily -Hydralazine IV prn SBP >180  Weakness Likely secondary to hypotension.  ?Cholecystitis Choledocholithiasis AST/ALT/Alk phos continue to climb, now with elevated bilirubin. RUQ ultrasound significant for cholelithiasis. Question of distal CBD stone. Discussed with GI on 4/4 who recommended surgery consult. HIDA significant for ?cholecystitis/cholecdocolithiasis with concern for ?pancreatic mass/tumor. CT scan significant for abnormal appearing gallbladder w/ concern for cholecystitis. ERCP significant for stone and patient is s/p sphincterotomy and biliary stent placement -GI recommendations recommendations: ERCP on 4/8 -General surgery  recommendations  Atrial fibrillation Stable. -Continue to hold Eliquis -Continue Heparin pending ERCP  COPD Stable. -Continue Spiriva and albuterol prn  Acute kidney injury Resolved with IV fluids.  TIA On Eliquis and Lipitor as an outpatient. -Heparin IV until completed procedures  Hyperlipidemia -Hold Lipitor in setting of acute liver injury  LBBB S/p pacemaker.  Severe protein calorie malnutrition  Chronic pain -Continue oxycodone 10 mg (home dose)  Nausea Zofran prn. One time dose of phenergan IV today.   DVT prophylaxis: Heparin IV Code Status:   Code Status: Full Code Family Communication: Husband at bedside Disposition Plan: Discharge pending workup for possible acute cholecystitis   Consultants:   General surgery  Gastroenterology  Procedures:   ERCP w/ sphincterotomy and biliary stent placement (4/7)  Antimicrobials:  None    Subjective: Nausea. Abdominal pain improved.  Objective: Vitals:   06/23/17 1000 06/23/17 1035 06/23/17 1138 06/23/17 1200  BP: (!) 201/77 (!) 182/78 (!) 190/80 (!) 160/72  Pulse: 61  (!) 59   Resp: 13  12   Temp: 97.8 F (36.6 C)  98.3 F (36.8 C)   TempSrc: Oral  Oral   SpO2: 100%  100%   Weight:      Height:        Intake/Output Summary (Last 24 hours) at 06/23/2017 1355 Last data filed at 06/23/2017 1332 Gross per 24 hour  Intake 4455.75 ml  Output 1200 ml  Net 3255.75 ml   Filed Weights   06/21/17 0532 06/22/17 0338 06/23/17 0704  Weight: 45.9 kg (101 lb 1.6 oz) 46.3 kg (102 lb 1.6 oz) 46.6 kg (102 lb 11.8 oz)    Examination:  General exam: Appears calm and comfortable Respiratory system: Diffuse crackles, no wheezing, normal respiratory effort. Cardiovascular system: Regular rate and rhythm. Normal S1 and S2. No heart murmurs present. No extra heart sounds Gastrointestinal system: Soft,  non-tender, non-distended, no guarding, no rebound, no masses felt Central nervous system: Alert, oriented. No  focal deficit Extremities: no edema or calf tenderness Skin: No cyanosis. No rashes Psychiatry: Judgement and insight appear normal. Mood & affect appropriate.     Data Reviewed: I have personally reviewed following labs and imaging studies  CBC: Recent Labs  Lab 06/19/17 2315 06/19/17 2336 06/20/17 0351 06/21/17 0805  WBC 11.2*  --  10.3 5.9  NEUTROABS 9.4*  --   --   --   HGB 10.1* 10.5* 9.0* 9.3*  HCT 31.5* 31.0* 28.9* 29.6*  MCV 91.0  --  90.6 89.4  PLT 272  --  211 111   Basic Metabolic Panel: Recent Labs  Lab 06/20/17 0351 06/21/17 0805 06/21/17 1624 06/22/17 0915 06/23/17 1115  NA 136 135 136 138 138  K 4.1 4.3 4.3 4.2 3.5  CL 102 104 104 106 104  CO2 '26 22 22 22 22  ' GLUCOSE 134* 126* 115* 89 128*  BUN '18 10 11 12 7  ' CREATININE 1.08* 0.70 0.67 0.75 0.75  CALCIUM 8.1* 8.8* 8.7* 8.6* 8.3*   GFR: Estimated Creatinine Clearance: 45.7 mL/min (by C-G formula based on SCr of 0.75 mg/dL). Liver Function Tests: Recent Labs  Lab 06/19/17 2315 06/21/17 0805 06/21/17 1624 06/22/17 0915 06/23/17 1115  AST 225* 1,237* 1,257* 586* 171*  ALT 315* 783* 829* 651* 434*  ALKPHOS 351* 671* 617* 557* 467*  BILITOT 0.8 2.5* 2.0* 1.0 0.7  PROT 6.7 6.4* 6.0* 6.4* 6.4*  ALBUMIN 3.1* 2.9* 2.7* 2.8* 2.8*   No results for input(s): LIPASE, AMYLASE in the last 168 hours. No results for input(s): AMMONIA in the last 168 hours. Coagulation Profile: Recent Labs  Lab 06/19/17 2315  INR 2.28   Cardiac Enzymes: No results for input(s): CKTOTAL, CKMB, CKMBINDEX, TROPONINI in the last 168 hours. BNP (last 3 results) No results for input(s): PROBNP in the last 8760 hours. HbA1C: No results for input(s): HGBA1C in the last 72 hours. CBG: No results for input(s): GLUCAP in the last 168 hours. Lipid Profile: No results for input(s): CHOL, HDL, LDLCALC, TRIG, CHOLHDL, LDLDIRECT in the last 72 hours. Thyroid Function Tests: No results for input(s): TSH, T4TOTAL, FREET4, T3FREE,  THYROIDAB in the last 72 hours. Anemia Panel: No results for input(s): VITAMINB12, FOLATE, FERRITIN, TIBC, IRON, RETICCTPCT in the last 72 hours. Sepsis Labs: Recent Labs  Lab 06/20/17 0351  PROCALCITON 0.70    Recent Results (from the past 240 hour(s))  Culture, blood (Routine X 2) w Reflex to ID Panel     Status: None (Preliminary result)   Collection Time: 06/20/17  3:51 AM  Result Value Ref Range Status   Specimen Description BLOOD LEFT HAND  Final   Special Requests IN PEDIATRIC BOTTLE Blood Culture adequate volume  Final   Culture   Final    NO GROWTH 2 DAYS Performed at [facility], [place] [address], [place], [place] [address]    Report Status PENDING  Incomplete  Culture, blood (Routine X 2) w Reflex to ID Panel     Status: None (Preliminary result)   Collection Time: 06/20/17  3:51 AM  Result Value Ref Range Status   Specimen Description BLOOD RIGHT HAND  Final   Special Requests   Final    BOTTLES DRAWN AEROBIC AND ANAEROBIC Blood Culture adequate volume   Culture   Final    NO GROWTH 2 DAYS Performed at [facility], [place] [address],  [place], [place] [address]    Report Status PENDING  Incomplete         Radiology Studies: Ct Abdomen Pelvis W Contrast  Result Date: 06/22/2017 CLINICAL DATA:  Abnormal liver function tests EXAM: CT ABDOMEN AND PELVIS WITHOUT AND WITH CONTRAST TECHNIQUE: Multidetector CT imaging of the abdomen and pelvis was performed using the standard protocol following bolus administration of intravenous contrast. CONTRAST:  179m ISOVUE-300 IOPAMIDOL (ISOVUE-300) INJECTION 61% COMPARISON:  07/29/2014 CT FINDINGS: Lower chest: Coronary sinus, right atrial and right ventricular pacing leads are noted. Heart is within normal limits for size. No pericardial effusion. Minimal subpleural areas of reticulation pulmonary opacities are noted, nonspecific but may reflect post inflammatory postinfectious change. Mild peribronchial thickening is  noted to both lower lobes. Minimal subpleural scarring is seen in the right middle lobe. No effusion or pneumothorax. Hepatobiliary: Pericholecystic fluid is noted with gallbladder thickening and dependent gallstones as well as biliary sludge noted. Findings would be consistent with an acute cholecystitis. The liver is unremarkable. Mild intra and extrahepatic ductal dilatation with soft tissue debris in the distal common bile duct near the ampulla is noted. Pancreas: No pancreatic ductal dilatation or mass.  Inflammation. Spleen: Mild splenomegaly with new 11 mm circumscribed hypodensity within the spleen that may reflect an acquired cyst. Adrenals/Urinary Tract: Normal bilateral adrenal glands. Mild renal cortical thinning on the left with scarring in the lower pole. A tiny too small to characterize cyst is seen in the lower pole of the left kidney. No nephrolithiasis. Chronic mild caliectasis of the right renal collecting system and right-sided renal pelvis. The urinary bladder is physiologically distended without focal mural thickening. Stomach/Bowel: Decompressed stomach. Enteric contrast noted within small bowel with contrast reaching the cecum. No bowel obstruction or inflammation. Large amount of retained stool in the rectosigmoid. Normal-appearing appendix. Vascular/Lymphatic: Moderate aortoiliac atherosclerosis without aneurysm. No lymphadenopathy. Reproductive: Status post hysterectomy. No adnexal masses. Other: No free air nor free fluid. Musculoskeletal: Right hip arthroplasty. Sacral neural stimulating device. IMPRESSION: 1. Abnormal appearing gallbladder with biliary sludge a calculi. Gallbladder wall thickening pericholecystic fluid raise concern for cholecystitis. There is mild intra and extrahepatic ductal dilatation soft tissue densities in the distal common bowel duct that may reflect biliary sludge/debris. 2. Unremarkable CT appearance of the pancreas. 3. Mild peribronchial thickening with right  greater than left lower lobe subpleural pulmonary opacities and reticulations may reflect stigmata postinfectious or postinflammatory change. 4. New 11 mm hypodensity in the spleen may reflect an acquired cyst. There is splenomegaly. 5. Mild bilateral renal cortical thinning with scarring in the left pole. Tiny too small to characterize cyst lower pole the left kidney is also noted. 6. Moderate aortoiliac atherosclerosis.  No lymphadenopathy. Electronically Signed   By: DAshley RoyaltyM.D.   On: 06/22/2017 01:06   Dg Ercp Biliary & Pancreatic Ducts  Result Date: 06/23/2017 CLINICAL DATA:  72year old female with choledocholithiasis EXAM: ERCP TECHNIQUE: Multiple spot images obtained with the fluoroscopic device and submitted for interpretation post-procedure. FLUOROSCOPY TIME:  Fluoroscopy Time:  0 minutes 28 seconds COMPARISON:  None. FINDINGS: Three saved images are submitted for review. The images demonstrate a flexible endoscope in the descending duodenum with wire cannulation of the common bile duct. Cholangiogram demonstrates numerous filling defects in the common duct consistent with choledocholithiasis. On the final image, a plastic biliary stent has been placed. IMPRESSION: 1. Choledocholithiasis. 2. ERCP with placement of a plastic biliary stent. These images were submitted for radiologic interpretation only. Please see the procedural report for the  amount of contrast and the fluoroscopy time utilized. Electronically Signed   By: Jacqulynn Cadet M.D.   On: 06/23/2017 10:18        Scheduled Meds: . carvedilol  25 mg Oral BID  . furosemide  40 mg Oral q morning - 10a  . gabapentin  600 mg Oral TID  . lisinopril  5 mg Oral Daily  . pantoprazole  40 mg Oral BID  . tiotropium  1 capsule Inhalation Daily   Continuous Infusions: . sodium chloride 75 mL/hr at 06/23/17 0939  . heparin 750 Units/hr (06/23/17 1159)     LOS: 2 days     Cordelia Poche, MD [facility] 06/23/2017, 1:55  PM Pager: [phone]  If 7PM-7AM, please contact night-coverage [email or web address] Password [email or web address] 06/23/2017, 1:55 PM

## 2017-06-23 NOTE — Plan of Care (Signed)
  Problem: Pain Managment: Goal: General experience of comfort will improve Outcome: Progressing   Problem: Safety: Goal: Ability to remain free from injury will improve Outcome: Progressing   Problem: Skin Integrity: Goal: Risk for impaired skin integrity will decrease Outcome: Progressing   Problem: Education: Goal: Ability to demonstrate management of disease process will improve Outcome: Progressing Goal: Ability to verbalize understanding of medication therapies will improve Outcome: Progressing   Problem: Activity: Goal: Capacity to carry out activities will improve Outcome: Progressing   Problem: Cardiac: Goal: Ability to achieve and maintain adequate cardiopulmonary perfusion will improve Outcome: Progressing

## 2017-06-23 NOTE — Anesthesia Postprocedure Evaluation (Signed)
Anesthesia Post Note  Patient: [name]  Procedure(s) Performed: ENDOSCOPIC RETROGRADE CHOLANGIOPANCREATOGRAPHY (ERCP) (N/A )     Patient location during evaluation: PACU Anesthesia Type: General Level of consciousness: sedated Pain management: pain level controlled Vital Signs Assessment: post-procedure vital signs reviewed and stable Respiratory status: spontaneous breathing and respiratory function stable Cardiovascular status: stable Postop Assessment: no apparent nausea or vomiting Anesthetic complications: no    Last Vitals:  Vitals:   06/23/17 0930 06/23/17 0945  BP: (!) 199/87 (!) 189/82  Pulse: 66 67  Resp: 10 12  Temp:    SpO2: 100% 100%    Last Pain:  Vitals:   06/23/17 0939  TempSrc:   PainSc: 8                  Jesten Cappuccio DANIEL

## 2017-06-23 NOTE — Transfer of Care (Signed)
Immediate Anesthesia Transfer of Care Note  Patient: [name]  Procedure(s) Performed: ENDOSCOPIC RETROGRADE CHOLANGIOPANCREATOGRAPHY (ERCP) (N/A )  Patient Location: PACU  Anesthesia Type:General  Level of Consciousness: awake, alert , oriented and patient cooperative  Airway & Oxygen Therapy: Patient Spontanous Breathing and Patient connected to nasal cannula oxygen  Post-op Assessment: Report given to RN and Post -op Vital signs reviewed and stable  Post vital signs: Reviewed  Last Vitals: 151/95, 75, 14, 100% 97.7 Vitals Value Taken Time  BP 161/95 06/23/2017  9:13 AM  Temp    Pulse 74 06/23/2017  9:17 AM  Resp 12 06/23/2017  9:17 AM  SpO2 93 % 06/23/2017  9:17 AM  Vitals shown include unvalidated device data.  Last Pain:  Vitals:   06/23/17 0913  TempSrc:   PainSc: 8       Patients Stated Pain Goal: 2 (15/18/34 3735)  Complications: No apparent anesthesia complications

## 2017-06-23 NOTE — Anesthesia Preprocedure Evaluation (Addendum)
Anesthesia Evaluation  Patient identified by MRN, date of birth, ID band  Reviewed: Allergy & Precautions, NPO status , Patient's Chart, lab work & pertinent test results  History of Anesthesia Complications Negative for: history of anesthetic complications  Airway Mallampati: I  TM Distance: <3 FB Neck ROM: Full    Dental no notable dental hx. (+) Dental Advisory Given   Pulmonary COPD, former smoker,    Pulmonary exam normal        Cardiovascular hypertension, + angina + CAD and +CHF  Normal cardiovascular exam+ pacemaker   Study Conclusions  - Left ventricle: The cavity size was normal. Systolic function was normal. The estimated ejection fraction was in the range of 55% to 60%. Wall motion was normal; there were no regional wall motion abnormalities. Doppler parameters are consistent with abnormal left ventricular relaxation (grade 1 diastolic dysfunction). - Mitral valve: Structurally normal valve. Chordal calcification. There was moderate regurgitation. - Left atrium: The atrium was mildly dilated. - Pulmonary arteries: Systolic pressure was mildly increased. PA peak pressure: 33 mm Hg (S).     Neuro/Psych TIAnegative psych ROS   GI/Hepatic Neg liver ROS, GERD  ,  Endo/Other  negative endocrine ROS  Renal/GU negative Renal ROS     Musculoskeletal negative musculoskeletal ROS (+)   Abdominal   Peds  Hematology negative hematology ROS (+)   Anesthesia Other Findings Day of surgery medications reviewed with the patient.  Reproductive/Obstetrics                           Anesthesia Physical Anesthesia Plan  ASA: III  Anesthesia Plan: General   Post-op Pain Management:    Induction: Intravenous  PONV Risk Score and Plan: 3 and Ondansetron, Dexamethasone and Diphenhydramine  Airway Management Planned: Oral ETT  Additional Equipment:   Intra-op Plan:   Post-operative  Plan: Extubation in OR  Informed Consent:   Plan Discussed with:   Anesthesia Plan Comments:         Anesthesia Quick Evaluation

## 2017-06-23 NOTE — Anesthesia Procedure Notes (Signed)
Procedure Name: Intubation Date/Time: 06/23/2017 8:32 AM Performed by: Sammie Bench, CRNA Pre-anesthesia Checklist: Patient identified, Emergency Drugs available, Suction available and Patient being monitored Patient Re-evaluated:Patient Re-evaluated prior to induction Oxygen Delivery Method: Circle System Utilized Preoxygenation: Pre-oxygenation with 100% oxygen Induction Type: IV induction Ventilation: Mask ventilation without difficulty Laryngoscope Size: Mac and 3 Grade View: Grade II Tube type: Oral Tube size: 7.0 mm Number of attempts: 1 Airway Equipment and Method: Stylet and Oral airway Placement Confirmation: ETT inserted through vocal cords under direct vision,  positive ETCO2 and breath sounds checked- equal and bilateral Secured at: 22 cm Tube secured with: Tape Dental Injury: Teeth and Oropharynx as per pre-operative assessment

## 2017-06-23 NOTE — Op Note (Addendum)
[facility] Patient Name: [name] Procedure Date : 06/23/2017 MRN: [id number] Attending MD: Carol Ada , MD Date of Birth: 1945/10/12 CSN: [id number] Age: 72 Admit Type: Inpatient Procedure:                ERCP Indications:              Biliary dilation on Computed Tomogram Scan, and                            "soft tissue densities" in the CBD Providers:                Carol Ada, MD, Zenon Mayo, RN, Elspeth Cho                            Tech., Technician, Mardene Celeste "Trish" Durene Romans, CRNA Referring MD:              Medicines:                General Anesthesia Complications:            No immediate complications. Estimated Blood Loss:     Estimated blood loss was minimal. Procedure:                Pre-Anesthesia Assessment:                           - Prior to the procedure, a History and Physical                            was performed, and patient medications and                            allergies were reviewed. The patient's tolerance of                            previous anesthesia was also reviewed. The risks                            and benefits of the procedure and the sedation                            options and risks were discussed with the patient.                            All questions were answered, and informed consent                            was obtained. Prior Anticoagulants: The patient has                            taken heparin, last dose was day of procedure. ASA                            Grade Assessment: III - A patient with severe  systemic disease. After reviewing the risks and                            benefits, the patient was deemed in satisfactory                            condition to undergo the procedure.                           - Sedation was administered by an anesthesia                            professional. General anesthesia was attained.                           After  obtaining informed consent, the scope was                            passed under direct vision. Throughout the                            procedure, the patient's blood pressure, pulse, and                            oxygen saturations were monitored continuously. The                            [id number] ([id number]) Pentax duodenoscope was                            introduced through the mouth, and used to inject                            contrast into and used to inject contrast into the                            bile duct. The ERCP was accomplished without                            difficulty. The patient tolerated the procedure                            well. Scope In: Scope Out: Findings:      The major papilla was normal. The bile duct was deeply cannulated with       the short-nosed traction sphincterotome. Contrast was injected. I       personally interpreted the bile duct images. There was brisk flow of       contrast through the ducts. Image quality was excellent. Contrast       extended to the main bile duct. Contrast extended to the cystic duct.       Contrast extended to the gallbladder. Contrast extended to the hepatic       ducts. The lower third of the main bile duct and middle third of the       main bile  duct contained filling defect(s) thought to be a stone. A       0.035 inch straight standard wire was passed into the biliary tree. A 5       mm biliary sphincterotomy was made with a traction (standard)       sphincterotome using ERBE electrocautery. Minor bleeding from the       sphincterotomy was successfully treated. One 8.5 Fr by 5 cm plastic       stent with a single external flap and a single internal flap was placed       4 cm into the common bile duct. Bile flowed through the stent. The stent       was in good position.      Heparin was only stopped one hour before the procedure. It was not clear       if the CBD contained stones or another abnormality and  the decision was       made to perform a diagnostic ERCP with the recent cessation of heparin.       Cannulation of the CBD was performed with ease during the first attempt.       The guidewire was secured in the right intrahepatic ducts. Contrast       injection did reveal 2-3 large filling defects in the mid to distal CBD.       As discussed with the patient prior to the procedure, stent placement,       if she had stones would be a good compromise in light of the heparin. A       small test cut sphincterotomy was created and no bleeding was       precipitated, however, at the final intended length of cut some mildly       persistent oozing of blood was encountered. A total of 1-1.5 ml of       1:10,000 of Epi was injected at the sphincterotomy. The bleeding was       arrested. An 8.5 Fr x 5 cm stent was successfully placed with excellent       biliary drainage. Impression:               - The major papilla appeared normal.                           - A filling defect consistent with a stone was seen                            on the cholangiogram.                           - The examination was suspicious for                            choledocholithiasis. Removal by biliary                            sphincterotomy was not accomplished; a stent was                            inserted.                           -  A biliary sphincterotomy was performed.                           - One plastic stent was placed into the common bile                            duct. Recommendation:           - Return patient to hospital ward for ongoing care.                           - Resume regular diet.                           - Resume heparin at 12 PM.                           ? Repeat ERCP tomorrow off of heparin. Procedure Code(s):        --- Professional ---                           [phone], Endoscopic retrograde                            cholangiopancreatography (ERCP); with placement of                             endoscopic stent into biliary or pancreatic duct,                            including pre- and post-dilation and guide wire                            passage, when performed, including sphincterotomy,                            when performed, each stent                           00370, Endoscopic catheterization of the biliary                            ductal system, radiological supervision and                            interpretation Diagnosis Code(s):        --- Professional ---                           R93.2, Abnormal findings on diagnostic imaging of                            liver and biliary tract                           K83.8, Other specified diseases of biliary tract CPT copyright 2017 American Medical Association. All rights reserved. The  codes documented in this report are preliminary and upon coder review may  be revised to meet current compliance requirements. Carol Ada, MD Carol Ada, MD 06/23/2017 9:14:48 AM This report has been signed electronically. Number of Addenda: 0

## 2017-06-23 NOTE — Progress Notes (Signed)
Surgery:  Currently down in endoscopy for ERCP Apparently has been stable overnight.  Voiding without difficulty.  Had a stool.    Will follow up later   [facility]. Dalbert Batman, M.D., [facility] General and Minimally invasive Surgery Breast and Colorectal Surgery Office:   [phone] Pager:   [phone]

## 2017-06-23 NOTE — Progress Notes (Signed)
[place] for Heparin Indication: atrial fibrillation  Allergies  Allergen Reactions  . Dabigatran Etexilate Mesylate Other (See Comments)    INTERNAL BLEEDING-pradaxa  . Talwin [Pentazocine] Other (See Comments)    hallucinations  . Lactate Rash    Patient Measurements: Height: 5' (152.4 cm) Weight: 102 lb 11.8 oz (46.6 kg) IBW/kg (Calculated) : 45.5 Heparin Dosing Weight: 46kg  Vital Signs: Temp: 98.3 F (36.8 C) (04/07 1138) Temp Source: Oral (04/07 1138) BP: 190/80 (04/07 1138) Pulse Rate: 59 (04/07 1138)  Labs: Recent Labs    06/21/17 0805 06/21/17 1624 06/22/17 0915 06/22/17 1743  HGB 9.3*  --   --   --   HCT 29.6*  --   --   --   PLT 217  --   --   --   APTT  --   --   --  60*  HEPARINUNFRC  --   --   --  1.04*  CREATININE 0.70 0.67 0.75  --     Estimated Creatinine Clearance: 45.7 mL/min (by C-G formula based on SCr of 0.75 mg/dL).   Medical History: Past Medical History:  Diagnosis Date  . Anemia   . Anginal pain ([place])   . Arthritis    right hip  . Atrial fibrillation ([place])   . Blood transfusion   . CAD (coronary artery disease)    mild disease per cath in 2011  . CHF (congestive heart failure) ([place])   . Chronic abdominal pain   . Chronic nausea   . Chronic pain syndrome   . COPD (chronic obstructive pulmonary disease) (H[facility])    continuous O2- 2l  . Diverticulitis   . Elbow fracture, left aug 2012  . Elevated liver enzymes   . Fibromyalgia    COUPLE OF TIMES A YEAR-PT HAS EPISODES OF CONFUSION-USUALLY INVOLVES DAY/NIGHT REVERSAL AND EPISODES OF TWITCHING AND FALLS--SEES DR. Jacelyn Grip - NEUROLOGIST-LAST SEEN 07/10/11  . Gastroparesis   . GERD (gastroesophageal reflux disease)   . H/O: GI bleed    from Pradaxa  . Hepatitis    hx of in high school   . HTN (hypertension)    under control; has been on med. x "years"  . Hx MRSA infection   . Hx of gastric ulcer   . Hx of stress fx aug 2011   right hip   .  LBBB (left bundle branch block)   . Oxygen dependent    2 liters via nasal cannula at all times  . Pacemaker    CRT therapy; followed by Dr. Caryl Comes  . Pleural effusion     s/p right thoracentesis 03/09  . Pneumonia - 03/09, 12/09, 02/10, 06/10   MOST RECENT FEB 2013  . Primary dilated cardiomyopathy (HCC)    EF 45 to 50% per echo in Jan 2012  . RLS (restless legs syndrome)   . Silent aspiration   . Small bowel obstruction ([place])   . Urinary incontinence     -INTERSTIM IMPLANT NOT FUNCTIONING PER PT  . Venous embolism and thrombosis of subclavian vein ([place])    after pacemaker insertion Oct 2011  . Vitamin B12 deficiency     Assessment: 72 yo female w/ recent TIA, readmitted for recurrent symptoms.  She is on apixaban PTA for afib and this is on hold. Patient s/p  ERCP 4/6 and heparin to resume at noon today. -prior heparin rate was 750 units/ht Goal of Therapy:  Heparin level 0.3-0.7 units/ml aPTT 66-102 seconds Monitor  platelets by anticoagulation protocol: Yes   Plan:  -Resume heparin at 750 units/hr -Heparin level and aPTT in 8 hours and daily wth CBC daily -Will follow plans for further procedures  Hildred Laser, PharmD Clinical Pharmacist Clinical phone from 8:30-4:00 is [phone] After 4pm, please call Main Rx ([phone]) for assistance. 06/23/2017 11:57 AM

## 2017-06-24 ENCOUNTER — Inpatient Hospital Stay (HOSPITAL_COMMUNITY): Payer: PPO | Admitting: Certified Registered Nurse Anesthetist

## 2017-06-24 ENCOUNTER — Encounter (HOSPITAL_COMMUNITY): Payer: Self-pay | Admitting: Gastroenterology

## 2017-06-24 ENCOUNTER — Encounter (HOSPITAL_COMMUNITY)
Admission: EM | Disposition: A | Discharge status: Home / Self Care | Payer: Self-pay | Source: Home / Self Care | Attending: Internal Medicine

## 2017-06-24 ENCOUNTER — Inpatient Hospital Stay (HOSPITAL_COMMUNITY): Payer: PPO

## 2017-06-24 DIAGNOSIS — K8050 Calculus of bile duct without cholangitis or cholecystitis without obstruction: Secondary | ICD-10-CM

## 2017-06-24 HISTORY — PX: ERCP: SHX5425

## 2017-06-24 LAB — COMPREHENSIVE METABOLIC PANEL
ALT: 272 U/L — ABNORMAL HIGH (ref 14–54)
AST: 63 U/L — ABNORMAL HIGH (ref 15–41)
Albumin: 2.5 g/dL — ABNORMAL LOW (ref 3.5–5.0)
Alkaline Phosphatase: 364 U/L — ABNORMAL HIGH (ref 38–126)
Anion gap: 9 (ref 5–15)
BUN: 9 mg/dL (ref 6–20)
CO2: 25 mmol/L (ref 22–32)
Calcium: 7.9 mg/dL — ABNORMAL LOW (ref 8.9–10.3)
Chloride: 104 mmol/L (ref 101–111)
Creatinine, Ser: 0.85 mg/dL (ref 0.44–1.00)
GFR calc Af Amer: >60 mL/min (ref >60)
GFR calc non Af Amer: >60 mL/min (ref >60)
Glucose, Bld: 104 mg/dL — ABNORMAL HIGH (ref 65–99)
Potassium: 3.1 mmol/L — ABNORMAL LOW (ref 3.5–5.1)
Sodium: 138 mmol/L (ref 135–145)
Total Bilirubin: 0.6 mg/dL (ref 0.3–1.2)
Total Protein: 5.8 g/dL — ABNORMAL LOW (ref 6.5–8.1)

## 2017-06-24 LAB — CBC
HCT: 27.6 % — ABNORMAL LOW (ref 36.0–46.0)
Hemoglobin: 8.9 g/dL — ABNORMAL LOW (ref 12.0–15.0)
MCH: 28.6 pg (ref 26.0–34.0)
MCHC: 32.2 g/dL (ref 30.0–36.0)
MCV: 88.7 fL (ref 78.0–100.0)
Platelets: 197 10*3/uL (ref 150–400)
RBC: 3.11 MIL/uL — ABNORMAL LOW (ref 3.87–5.11)
RDW: 14.1 % (ref 11.5–15.5)
WBC: 6.8 10*3/uL (ref 4.0–10.5)

## 2017-06-24 LAB — APTT: aPTT: 59 seconds — ABNORMAL HIGH (ref 24–36)

## 2017-06-24 LAB — HEPARIN LEVEL (UNFRACTIONATED): Heparin Unfractionated: 0.21 IU/mL — ABNORMAL LOW (ref 0.30–0.70)

## 2017-06-24 SURGERY — ERCP, WITH INTERVENTION IF INDICATED
Anesthesia: General

## 2017-06-24 MED ORDER — PROPOFOL 10 MG/ML IV BOLUS
INTRAVENOUS | Status: DC | PRN
  Administered 2017-06-24: 100 mg via INTRAVENOUS

## 2017-06-24 MED ORDER — LISINOPRIL 10 MG PO TABS
10.0000 mg | ORAL_TABLET | Freq: Every day | ORAL | Status: DC
  Administered 2017-06-24 – 2017-06-25 (×2): 10 mg via ORAL
  Filled 2017-06-24 (×2): qty 1

## 2017-06-24 MED ORDER — GLUCAGON HCL RDNA (DIAGNOSTIC) 1 MG IJ SOLR
INTRAMUSCULAR | Status: AC
  Filled 2017-06-24: qty 1

## 2017-06-24 MED ORDER — CEFAZOLIN SODIUM-DEXTROSE 1-4 GM/50ML-% IV SOLN
INTRAVENOUS | Status: AC
  Filled 2017-06-24: qty 50

## 2017-06-24 MED ORDER — LIDOCAINE 2% (20 MG/ML) 5 ML SYRINGE
INTRAMUSCULAR | Status: DC | PRN
  Administered 2017-06-24: 100 mg via INTRAVENOUS

## 2017-06-24 MED ORDER — GLUCAGON HCL RDNA (DIAGNOSTIC) 1 MG IJ SOLR
INTRAMUSCULAR | Status: DC | PRN
  Administered 2017-06-24 (×2): .2 mg via INTRAVENOUS

## 2017-06-24 MED ORDER — CEFAZOLIN SODIUM-DEXTROSE 1-4 GM/50ML-% IV SOLN
INTRAVENOUS | Status: DC | PRN
  Administered 2017-06-24: 1 g via INTRAVENOUS

## 2017-06-24 MED ORDER — FENTANYL CITRATE (PF) 250 MCG/5ML IJ SOLN
INTRAMUSCULAR | Status: DC | PRN
  Administered 2017-06-24 (×2): 50 ug via INTRAVENOUS

## 2017-06-24 MED ORDER — DEXAMETHASONE SODIUM PHOSPHATE 10 MG/ML IJ SOLN
INTRAMUSCULAR | Status: DC | PRN
Start: 2017-06-24 — End: 2017-06-24
  Administered 2017-06-24: 10 mg via INTRAVENOUS

## 2017-06-24 MED ORDER — INDOMETHACIN 50 MG RE SUPP
RECTAL | Status: AC
  Filled 2017-06-24: qty 2

## 2017-06-24 MED ORDER — FENTANYL CITRATE (PF) 100 MCG/2ML IJ SOLN
25.0000 ug | Freq: Once | INTRAMUSCULAR | Status: AC
  Administered 2017-06-24: 25 ug via INTRAVENOUS

## 2017-06-24 MED ORDER — HEPARIN (PORCINE) IN NACL 100-0.45 UNIT/ML-% IJ SOLN
900.0000 [IU]/h | INTRAMUSCULAR | Status: DC
  Administered 2017-06-24 – 2017-06-25 (×2): 900 [IU]/h via INTRAVENOUS
  Filled 2017-06-24: qty 250

## 2017-06-24 MED ORDER — ONDANSETRON HCL 4 MG/2ML IJ SOLN
INTRAMUSCULAR | Status: DC | PRN
  Administered 2017-06-24: 4 mg via INTRAVENOUS

## 2017-06-24 MED ORDER — EPHEDRINE SULFATE 50 MG/ML IJ SOLN
INTRAMUSCULAR | Status: DC | PRN
  Administered 2017-06-24 (×3): 5 mg via INTRAVENOUS

## 2017-06-24 MED ORDER — LEVOFLOXACIN IN D5W 500 MG/100ML IV SOLN
500.0000 mg | Freq: Every day | INTRAVENOUS | Status: DC
  Administered 2017-06-24 – 2017-06-27 (×4): 500 mg via INTRAVENOUS
  Filled 2017-06-24 (×3): qty 100

## 2017-06-24 MED ORDER — INDOMETHACIN 50 MG RE SUPP
RECTAL | Status: DC | PRN
  Administered 2017-06-24: 100 mg via RECTAL

## 2017-06-24 MED ORDER — SODIUM CHLORIDE 0.9 % IV SOLN
INTRAVENOUS | Status: DC | PRN
  Administered 2017-06-24: 40 mL

## 2017-06-24 MED ORDER — FENTANYL CITRATE (PF) 100 MCG/2ML IJ SOLN
INTRAMUSCULAR | Status: AC
  Filled 2017-06-24: qty 2

## 2017-06-24 MED ORDER — ROCURONIUM BROMIDE 10 MG/ML (PF) SYRINGE
PREFILLED_SYRINGE | INTRAVENOUS | Status: DC | PRN
  Administered 2017-06-24: 40 mg via INTRAVENOUS

## 2017-06-24 MED ORDER — SUGAMMADEX SODIUM 200 MG/2ML IV SOLN
INTRAVENOUS | Status: DC | PRN
  Administered 2017-06-24: 100 mg via INTRAVENOUS

## 2017-06-24 NOTE — Progress Notes (Signed)
[place] for Heparin Indication: atrial fibrillation  Allergies  Allergen Reactions  . Dabigatran Etexilate Mesylate Other (See Comments)    INTERNAL BLEEDING-pradaxa  . Talwin [Pentazocine] Other (See Comments)    hallucinations  . Lactate Rash    Patient Measurements: Height: 5' (152.4 cm) Weight: 102 lb 11.8 oz (46.6 kg) IBW/kg (Calculated) : 45.5 Heparin Dosing Weight: 46kg  Vital Signs: Temp: 97.5 F (36.4 C) (04/07 1927) Temp Source: Oral (04/07 1927) BP: 148/65 (04/07 2230) Pulse Rate: 68 (04/07 2230)  Assessment: 72 yo female w/ recent TIA, readmitted for recurrent symptoms.  She is on apixaban PTA for afib, currently on hold. Patient s/p  ERCP 4/6, heparin was resumed today around noon. Heparin level and aPTT this evening are both subtherapeutic. I spoke with the RN, there has not been any issues with the infusion.   Goal of Therapy:  Heparin level 0.3-0.7 units/ml aPTT 66-102 seconds Monitor platelets by anticoagulation protocol: Yes   Plan:  -Increase heparin to 900 units/hr -Heparin level and aPTT in 8 hours and daily wth CBC daily    Harvel Quale 06/24/2017 12:44 AM

## 2017-06-24 NOTE — Progress Notes (Signed)
PROGRESS NOTE    [name]  OEH:[id number] DOB: 11/13/1945 DOA: 06/19/2017 PCP: Ann Held, DO   Brief Narrative: [name] is a 72 y.o. female with medical history significant of hypertension, hyperlipidemia, COPD, bronchiectasis, GERD, atrial fibrillation on Eliquis, pacemaker placement, left bundle blockage, gastric ulcer, GI bleeding, dCHF, anemia, psoriasis, abnormal liver function. She presented with lightheadedness found to have hypotension. Now with elevated LFTs secondary to choledocholithiasis.   Assessment & Plan:   Principal Problem:   Weakness Active Problems:   ANEMIA-IRON DEFICIENCY   Essential hypertension   Atrial fibrillation/flutter   COPD with emphysema (HCC)   GERD (gastroesophageal reflux disease)   Chronic diastolic CHF (congestive heart failure) (HCC)   TIA (transient ischemic attack)   AKI (acute kidney injury) (HCC)   Abnormal LFTs   Hypotension   HLD (hyperlipidemia)   CAD (coronary artery disease)   Protein-calorie malnutrition, severe (HCC)   Elevated ALT measurement   Hypotension Resolved on arrival after IV fluids. Now hypertensive.  Essential hypertension Blood pressure high today. -Continue Coreg 25 mg BID -Continue home furosemide -Start lisinopril 10 mg daily -Hydralazine IV prn SBP >180  Weakness Likely secondary to hypotension.  ?Cholecystitis Choledocholithiasis AST/ALT/Alk phos continue to climb, now with elevated bilirubin. RUQ ultrasound significant for cholelithiasis. Question of distal CBD stone. Discussed with GI on 4/4 who recommended surgery consult. HIDA significant for ?cholecystitis/cholecdocolithiasis with concern for ?pancreatic mass/tumor. CT scan significant for abnormal appearing gallbladder w/ concern for cholecystitis. ERCP significant for stone and patient is s/p sphincterotomy and biliary stent placement -GI recommendations recommendations: ERCP planned for today -General surgery  recommendations  Atrial fibrillation Stable. -Continue to hold Eliquis -Continue Heparin pending ERCP  COPD Stable. -Continue Spiriva and albuterol prn  Acute kidney injury Resolved with IV fluids.  TIA On Eliquis and Lipitor as an outpatient. -Heparin IV until completed procedures  Hyperlipidemia -Hold Lipitor in setting of acute liver injury  LBBB S/p pacemaker.  Severe protein calorie malnutrition  Chronic pain -Continue oxycodone 10 mg (home dose)  Nausea Zofran prn. One time dose of phenergan IV today.   DVT prophylaxis: Heparin IV Code Status:   Code Status: Full Code Family Communication: None at bedside Disposition Plan: Discharge pending workup for choledocholithiasis   Consultants:   General surgery  Gastroenterology  Procedures:   ERCP w/ sphincterotomy and biliary stent placement (4/7)  Antimicrobials:  None    Subjective: No issues overnight.  Objective: Vitals:   06/23/17 1927 06/23/17 2230 06/24/17 0500 06/24/17 0509  BP: (!) 105/58 (!) 148/65  (!) 178/78  Pulse: 71 68  80  Resp: 18   18  Temp: (!) 97.5 F (36.4 C)   97.9 F (36.6 C)  TempSrc: Oral   Oral  SpO2: 98%   99%  Weight:   47.3 kg (104 lb 3.2 oz)   Height:        Intake/Output Summary (Last 24 hours) at 06/24/2017 0837 Last data filed at 06/24/2017 0528 Gross per 24 hour  Intake 2548.11 ml  Output 1000 ml  Net 1548.11 ml   Filed Weights   06/22/17 0338 06/23/17 0704 06/24/17 0500  Weight: 46.3 kg (102 lb 1.6 oz) 46.6 kg (102 lb 11.8 oz) 47.3 kg (104 lb 3.2 oz)    Examination:  General exam: Appears calm and comfortable Respiratory system: Diffuse rhonchi. Respiratory effort normal. Cardiovascular system: S1 & S2 heard, RRR. No murmurs, rubs, gallops or clicks. Gastrointestinal system: Abdomen is nondistended, soft and nontender.  No organomegaly or masses felt. Normal bowel sounds heard. Central nervous system: Alert and oriented. No focal neurological  deficits. Extremities: No edema. No calf tenderness Skin: No cyanosis. No rashes Psychiatry: Judgement and insight appear normal. Mood & affect appropriate.      Data Reviewed: I have personally reviewed following labs and imaging studies  CBC: Recent Labs  Lab 06/19/17 2315 06/19/17 2336 06/20/17 0351 06/21/17 0805  WBC 11.2*  --  10.3 5.9  NEUTROABS 9.4*  --   --   --   HGB 10.1* 10.5* 9.0* 9.3*  HCT 31.5* 31.0* 28.9* 29.6*  MCV 91.0  --  90.6 89.4  PLT 272  --  211 023   Basic Metabolic Panel: Recent Labs  Lab 06/20/17 0351 06/21/17 0805 06/21/17 1624 06/22/17 0915 06/23/17 1115  NA 136 135 136 138 138  K 4.1 4.3 4.3 4.2 3.5  CL 102 104 104 106 104  CO2 '26 22 22 22 22  ' GLUCOSE 134* 126* 115* 89 128*  BUN '18 10 11 12 7  ' CREATININE 1.08* 0.70 0.67 0.75 0.75  CALCIUM 8.1* 8.8* 8.7* 8.6* 8.3*   GFR: Estimated Creatinine Clearance: 45.7 mL/min (by C-G formula based on SCr of 0.75 mg/dL). Liver Function Tests: Recent Labs  Lab 06/19/17 2315 06/21/17 0805 06/21/17 1624 06/22/17 0915 06/23/17 1115  AST 225* 1,237* 1,257* 586* 171*  ALT 315* 783* 829* 651* 434*  ALKPHOS 351* 671* 617* 557* 467*  BILITOT 0.8 2.5* 2.0* 1.0 0.7  PROT 6.7 6.4* 6.0* 6.4* 6.4*  ALBUMIN 3.1* 2.9* 2.7* 2.8* 2.8*   No results for input(s): LIPASE, AMYLASE in the last 168 hours. No results for input(s): AMMONIA in the last 168 hours. Coagulation Profile: Recent Labs  Lab 06/19/17 2315  INR 2.28   Cardiac Enzymes: No results for input(s): CKTOTAL, CKMB, CKMBINDEX, TROPONINI in the last 168 hours. BNP (last 3 results) No results for input(s): PROBNP in the last 8760 hours. HbA1C: No results for input(s): HGBA1C in the last 72 hours. CBG: No results for input(s): GLUCAP in the last 168 hours. Lipid Profile: No results for input(s): CHOL, HDL, LDLCALC, TRIG, CHOLHDL, LDLDIRECT in the last 72 hours. Thyroid Function Tests: No results for input(s): TSH, T4TOTAL, FREET4, T3FREE,  THYROIDAB in the last 72 hours. Anemia Panel: No results for input(s): VITAMINB12, FOLATE, FERRITIN, TIBC, IRON, RETICCTPCT in the last 72 hours. Sepsis Labs: Recent Labs  Lab 06/20/17 0351  PROCALCITON 0.70    Recent Results (from the past 240 hour(s))  Culture, blood (Routine X 2) w Reflex to ID Panel     Status: None (Preliminary result)   Collection Time: 06/20/17  3:51 AM  Result Value Ref Range Status   Specimen Description BLOOD LEFT HAND  Final   Special Requests IN PEDIATRIC BOTTLE Blood Culture adequate volume  Final   Culture   Final    NO GROWTH 3 DAYS Performed at [facility], [place] [address], [place], [place] [address]    Report Status PENDING  Incomplete  Culture, blood (Routine X 2) w Reflex to ID Panel     Status: None (Preliminary result)   Collection Time: 06/20/17  3:51 AM  Result Value Ref Range Status   Specimen Description BLOOD RIGHT HAND  Final   Special Requests   Final    BOTTLES DRAWN AEROBIC AND ANAEROBIC Blood Culture adequate volume   Culture   Final    NO GROWTH 3 DAYS Performed at [facility], [address] [place], [place] [address]    Report Status PENDING  Incomplete         Radiology Studies: Dg Ercp Biliary & Pancreatic Ducts  Result Date: 06/23/2017 CLINICAL DATA:  72 year old female with choledocholithiasis EXAM: ERCP TECHNIQUE: Multiple spot images obtained with the fluoroscopic device and submitted for interpretation post-procedure. FLUOROSCOPY TIME:  Fluoroscopy Time:  0 minutes 28 seconds COMPARISON:  None. FINDINGS: Three saved images are submitted for review. The images demonstrate a flexible endoscope in the descending duodenum with wire cannulation of the common bile duct. Cholangiogram demonstrates numerous filling defects in the common duct consistent with choledocholithiasis. On the final image, a plastic biliary stent has been placed. IMPRESSION: 1. Choledocholithiasis. 2. ERCP with placement of a plastic  biliary stent. These images were submitted for radiologic interpretation only. Please see the procedural report for the amount of contrast and the fluoroscopy time utilized. Electronically Signed   By: Jacqulynn Cadet M.D.   On: 06/23/2017 10:18        Scheduled Meds: . carvedilol  25 mg Oral BID  . furosemide  40 mg Oral q morning - 10a  . gabapentin  600 mg Oral TID  . lisinopril  10 mg Oral Daily  . pantoprazole  40 mg Oral BID  . tiotropium  1 capsule Inhalation Daily   Continuous Infusions: . sodium chloride 75 mL/hr at 06/23/17 2231  . sodium chloride    . heparin Stopped (06/24/17 0700)     LOS: 3 days     Cordelia Poche, MD [facility] 06/24/2017, 8:37 AM Pager: [phone]  If 7PM-7AM, please contact night-coverage [email or web address] Password [facility] 06/24/2017, 8:37 AM

## 2017-06-24 NOTE — Progress Notes (Signed)
Patient is alert oriented pleasant and talkative. Vital stable IV patent.

## 2017-06-24 NOTE — Anesthesia Preprocedure Evaluation (Signed)
Anesthesia Evaluation  Patient identified by MRN, date of birth, ID band Patient awake    Reviewed: Allergy & Precautions, H&P , NPO status , Patient's Chart, lab work & pertinent test results, reviewed documented beta blocker date and time   History of Anesthesia Complications Negative for: history of anesthetic complications  Airway Mallampati: I  TM Distance: <3 FB Neck ROM: Full    Dental no notable dental hx. (+) Dental Advisory Given   Pulmonary COPD, former smoker,    Pulmonary exam normal breath sounds clear to auscultation       Cardiovascular hypertension, + angina + CAD and +CHF  Normal cardiovascular exam+ pacemaker  Rhythm:regular Rate:Normal  Study Conclusions  - Left ventricle: The cavity size was normal. Systolic function was normal. The estimated ejection fraction was in the range of 55% to 60%. Wall motion was normal; there were no regional wall motion abnormalities. Doppler parameters are consistent with abnormal left ventricular relaxation (grade 1 diastolic dysfunction). - Mitral valve: Structurally normal valve. Chordal calcification. There was moderate regurgitation. - Left atrium: The atrium was mildly dilated. - Pulmonary arteries: Systolic pressure was mildly increased. PA peak pressure: 33 mm Hg (S).     Neuro/Psych TIAnegative psych ROS   GI/Hepatic Neg liver ROS, GERD  ,  Endo/Other  negative endocrine ROS  Renal/GU negative Renal ROS     Musculoskeletal negative musculoskeletal ROS (+)   Abdominal   Peds  Hematology negative hematology ROS (+) anemia ,   Anesthesia Other Findings Day of surgery medications reviewed with the patient.  Reproductive/Obstetrics                             Anesthesia Physical  Anesthesia Plan  ASA: III  Anesthesia Plan: General   Post-op Pain Management:    Induction: Intravenous  PONV Risk Score and Plan: 3 and  Ondansetron, Dexamethasone, Diphenhydramine and Treatment may vary due to age or medical condition  Airway Management Planned: Oral ETT  Additional Equipment:   Intra-op Plan:   Post-operative Plan: Extubation in OR  Informed Consent: I have reviewed the patients History and Physical, chart, labs and discussed the procedure including the risks, benefits and alternatives for the proposed anesthesia with the patient or authorized representative who has indicated his/her understanding and acceptance.   Dental Advisory Given  Plan Discussed with: CRNA and Surgeon  Anesthesia Plan Comments:         Anesthesia Quick Evaluation

## 2017-06-24 NOTE — Progress Notes (Signed)
Patient in recovery after ERCP. BP's elevated (see flowsheet). Treated 9 out of 10 pain with 25 mcg of Fentanyl, with BP's still elevated. Spoke with MDA and he is ok with patient returning to floor and taking scheduled PO BP medications. Bedside RN made aware.

## 2017-06-24 NOTE — Progress Notes (Signed)
Lab not able do drew blood for morning labs due to patient being "hard stick". Patient has other lab orders scheduled around 8 AM. Will come back and try again.  Will continue to monitor.  Burlin Mcnair, RN

## 2017-06-24 NOTE — Op Note (Addendum)
[facility] Patient Name: [name] Procedure Date : 06/24/2017 MRN: [id number] Attending MD: [name] MD, MD Date of Birth: [birth year] CSN: [id number] Age: 72 Admit Type: Inpatient Procedure:                ERCP Indications:              Common bile duct stone(s), Abnormal liver function                            tests Providers:                [name] MD, MD, Angus Seller, Cletis Athens,                            Technician Referring MD:             Hospitalists, Dr Carlean Purl Medicines:                General Anesthesia Complications:            No immediate complications. Estimated Blood Loss:     Estimated blood loss: none. Procedure:                Pre-Anesthesia Assessment:                           - Prior to the procedure, a History and Physical                            was performed, and patient medications and                            allergies were reviewed. The patient is competent.                            The risks and benefits of the procedure and the                            sedation options and risks were discussed with the                            patient and patient's husband. The risks included                            the risks of perforation requiring surgeryat less                            than 1%, bleeding requiring blood transfusion at                            less than 5% after sphincterotomy, pancreatitis at                            less than 10% were discussed. The benefits were all  also discussed. They wish to proceed. All questions                            were answered and informed consent was obtained.                            Patient identification and proposed procedure were                            verified by the physician. Mental Status                            Examination: alert and oriented. CV Examination:                            normal. Prophylactic Antibiotics:  The patient                            requires prophylactic antibiotics. Prior                            Anticoagulants: The patient has taken heparin, last                            dose was day of procedure. ASA Grade Assessment:                            III - A patient with severe systemic disease. After                            reviewing the risks and benefits, the patient was                            deemed in satisfactory condition to undergo the                            procedure. The anesthesia plan was to use general                            anesthesia. Immediately prior to administration of                            medications, the patient was re-assessed for                            adequacy to receive sedatives. The heart rate,                            respiratory rate, oxygen saturations, blood                            pressure, adequacy of pulmonary ventilation, and  response to care were monitored throughout the                            procedure. The physical status of the patient was                            re-assessed after the procedure.                           After obtaining informed consent, the scope was                            passed under direct vision. Throughout the                            procedure, the patient's blood pressure, pulse, and                            oxygen saturations were monitored continuously. The                            Duodenoscope was introduced through the mouth, and                            used to inject contrast into and used to locate the                            major papilla. The ERCP was accomplished without                            difficulty. The patient tolerated the procedure                            well. Scope In: Scope Out: Findings:      A biliary stent was visible on the scout film. After cannulation of the       common bile duct, the biliary stent  was removed using a snare. CBD was       recannulated and contrast injected. There were multiple filling defects       in the common bile duct consistent with CBD stones. The largest measured       1 cm. The CBD, common hepatic duct, right and left hepatic ducts were       dilated. The common bile duct measured 1.2 cm. A generous biliary       sphincterotomy (tailored to the stones) was made with a short nose       sphincterotome using ERBE electrocautery ENDOCUT at 12:00 position.       There was no post-sphincterotomy bleeding. There was significant       drainage of pus. The biliary tree was swept with a 12 mm balloon       starting at the bifurcation several times. Three stones were       removed.Endoscopic photo-documentation was obtained. The largest one       measured 1 cm. There was significant sludge. No stones remained. Cystic       duct  was normal. There were multiple filling defects in the gallbladder       consistent with cholelithiasis. 1 filling defect was large measuring       approximately 3.5 cm in the gallbladder.      Pancreatic duct was intentionally not cannulated to decrease the       incidence of pancreatitis. Impression:               - Choledocholithiasis s/p biliary sphincterotomy                            and balloon extraction.                           - Ascending cholangitis                           - Cholelithiasis Recommendation:           - Return patient to hospital ward for ongoing care.                           - Clear liquid diet today.                           -Would start broad-spectrum antibiotics.                           -Restart heparin in 6 hours.                           -Recommend surgical consultation for laparoscopy                            cholecystectomy. Procedure Code(s):        --- Professional ---                           [phone], Esophagogastroduodenoscopy, flexible,                            transoral; diagnostic, including  collection of                            specimen(s) by brushing or washing, when performed                            (separate procedure) Diagnosis Code(s):        --- Professional ---                           K80.50, Calculus of bile duct without cholangitis                            or cholecystitis without obstruction CPT copyright 2017 [employer]. All rights reserved. The codes documented in this report are preliminary and upon coder review may  be revised to meet current compliance requirements. [name] MD, MD 06/24/2017 11:56:22 AM This report has been signed electronically. Number of Addenda: 0

## 2017-06-24 NOTE — Progress Notes (Signed)
[place] for Heparin Indication: atrial fibrillation  Allergies  Allergen Reactions  . Dabigatran Etexilate Mesylate Other (See Comments)    INTERNAL BLEEDING-pradaxa  . Talwin [Pentazocine] Other (See Comments)    hallucinations  . Lactate Rash    Patient Measurements: Height: 5' (152.4 cm) Weight: 104 lb 3.2 oz (47.3 kg) IBW/kg (Calculated) : 45.5 Heparin Dosing Weight: 46kg  Vital Signs: Temp: 98.3 F (36.8 C) (04/08 1147) Temp Source: Oral (04/08 1147) BP: 185/75 (04/08 1301) Pulse Rate: 70 (04/08 1301)  Assessment: 72 yo female w/ recent TIA, readmitted for recurrent symptoms.  She is on apixaban PTA for afib, currently on hold. Patient s/p  ERCP 4/6, heparin was resumed today around noon.  Heparin held this morning for GI procedure. Most recent level collected ~45 minutes after infusion was paused for the procedure. Will resume infusion without bolus at same rate as before. CBC stable, no overt bleeding  Goal of Therapy:  Heparin level 0.3-0.7 units/ml aPTT 66-102 seconds Monitor platelets by anticoagulation protocol: Yes   Plan:  -Resume heparin gtt at 900 units/hr tonight at 1800 -Heparin level and aPTT in 8 hours and daily wth CBC daily -Monitor for     Jodean Lima Bobbie Virden 06/24/2017 1:10 PM

## 2017-06-24 NOTE — Interval H&P Note (Signed)
History and Physical Interval Note:  06/24/2017 10:15 AM  [name]  has presented today for surgery, with the diagnosis of Choledocholithiasis  The various methods of treatment have been discussed with the patient and family. After consideration of risks, benefits and other options for treatment, the patient has consented to  Procedure(s): ENDOSCOPIC RETROGRADE CHOLANGIOPANCREATOGRAPHY (ERCP) (N/A) as a surgical intervention .  The patient's history has been reviewed, patient examined, no change in status, stable for surgery.  I have reviewed the patient's chart and labs.  Questions were answered to the patient's satisfaction.     [name]

## 2017-06-24 NOTE — Transfer of Care (Signed)
Immediate Anesthesia Transfer of Care Note  Patient: [name]  Procedure(s) Performed: ENDOSCOPIC RETROGRADE CHOLANGIOPANCREATOGRAPHY (ERCP) (N/A )  Patient Location: Endoscopy Unit  Anesthesia Type:General  Level of Consciousness: awake, alert  and oriented  Airway & Oxygen Therapy: Patient Spontanous Breathing and Patient connected to nasal cannula oxygen  Post-op Assessment: Report given to RN and Post -op Vital signs reviewed and stable  Post vital signs: Reviewed and stable  Last Vitals:  Vitals Value Taken Time  BP 193/72 06/24/2017 11:45 AM  Temp    Pulse 85 06/24/2017 11:46 AM  Resp 12 06/24/2017 11:46 AM  SpO2 100 % 06/24/2017 11:46 AM  Vitals shown include unvalidated device data.  Last Pain:  Vitals:   06/24/17 1147  TempSrc: Oral  PainSc:       Patients Stated Pain Goal: 2 (41/93/79 0240)  Complications: No apparent anesthesia complications

## 2017-06-24 NOTE — Anesthesia Procedure Notes (Signed)
Procedure Name: Intubation Date/Time: 06/24/2017 10:42 AM Performed by: Bryson Corona, CRNA Pre-anesthesia Checklist: Patient identified, Emergency Drugs available, Suction available and Patient being monitored Patient Re-evaluated:Patient Re-evaluated prior to induction Oxygen Delivery Method: Circle System Utilized Preoxygenation: Pre-oxygenation with 100% oxygen Induction Type: IV induction Ventilation: Mask ventilation without difficulty Laryngoscope Size: Mac and 3 Grade View: Grade I Tube type: Oral Tube size: 7.0 mm Number of attempts: 1 Airway Equipment and Method: Stylet and Oral airway Placement Confirmation: ETT inserted through vocal cords under direct vision,  positive ETCO2 and breath sounds checked- equal and bilateral Secured at: 21 cm Tube secured with: Tape Dental Injury: Teeth and Oropharynx as per pre-operative assessment

## 2017-06-24 NOTE — Progress Notes (Signed)
1 Day Post-Op   Subjective/Chief Complaint: PT with ERCP and stent placed yesterday. Plan for repeat ERCP today by Dr. Benson Norway   Objective: Vital signs in last 24 hours: Temp:  [97.5 F (36.4 C)-98.3 F (36.8 C)] 97.9 F (36.6 C) (04/08 0509) Pulse Rate:  [59-80] 80 (04/08 0509) Resp:  [10-18] 18 (04/08 0509) BP: (105-201)/(58-95) 178/78 (04/08 0509) SpO2:  [97 %-100 %] 99 % (04/08 0509) Weight:  [47.3 kg (104 lb 3.2 oz)] 47.3 kg (104 lb 3.2 oz) (04/08 0500) Last BM Date: 06/24/17  Intake/Output from previous day: 04/07 0701 - 04/08 0700 In: 2948.1 [P.O.:800; I.V.:2148.1] Out: 1000 [Urine:1000] Intake/Output this shift: No intake/output data recorded.  Constitutional: No acute distress, conversant, appears states age. Eyes: Anicteric sclerae, moist conjunctiva, no lid lag Lungs: Clear to auscultation bilaterally, normal respiratory effort CV: regular rate and rhythm, no murmurs, no peripheral edema, pedal pulses 2+ GI: Soft, no masses or hepatosplenomegaly, non-tender to palpation Skin: No rashes, palpation reveals normal turgor Psychiatric: appropriate judgment and insight, oriented to person, place, and time   Lab Results:  Recent Labs    06/21/17 0805  WBC 5.9  HGB 9.3*  HCT 29.6*  PLT 217   BMET Recent Labs    06/22/17 0915 06/23/17 1115  NA 138 138  K 4.2 3.5  CL 106 104  CO2 22 22  GLUCOSE 89 128*  BUN 12 7  CREATININE 0.75 0.75  CALCIUM 8.6* 8.3*   PT/INR No results for input(s): LABPROT, INR in the last 72 hours. ABG No results for input(s): PHART, HCO3 in the last 72 hours.  Invalid input(s): PCO2, PO2  Studies/Results: Dg Ercp Biliary & Pancreatic Ducts  Result Date: 06/23/2017 CLINICAL DATA:  72 year old female with choledocholithiasis EXAM: ERCP TECHNIQUE: Multiple spot images obtained with the fluoroscopic device and submitted for interpretation post-procedure. FLUOROSCOPY TIME:  Fluoroscopy Time:  0 minutes 28 seconds COMPARISON:  None.  FINDINGS: Three saved images are submitted for review. The images demonstrate a flexible endoscope in the descending duodenum with wire cannulation of the common bile duct. Cholangiogram demonstrates numerous filling defects in the common duct consistent with choledocholithiasis. On the final image, a plastic biliary stent has been placed. IMPRESSION: 1. Choledocholithiasis. 2. ERCP with placement of a plastic biliary stent. These images were submitted for radiologic interpretation only. Please see the procedural report for the amount of contrast and the fluoroscopy time utilized. Electronically Signed   By: Jacqulynn Cadet M.D.   On: 06/23/2017 10:18    Anti-infectives: Anti-infectives (From admission, onward)   None      Assessment/Plan: Principal Problem:   Weakness Active Problems:   ANEMIA-IRON DEFICIENCY   Essential hypertension   Atrial fibrillation/flutter - on Eliquis   LBBB - pacemaker   COPD with emphysema (HCC)   GERD (gastroesophageal reflux disease)   Chronic diastolic CHF (congestive heart failure) (HCC)   TIA (transient ischemic attack) - 05/31/17   AKI (acute kidney injury) (HCC)   Abnormal LFTs   Hypotension   HLD (hyperlipidemia)   CAD (coronary artery disease)   Protein-calorie malnutrition, severe (HCC)   Extensive abdominal surgical history  Cholelithiasis/?Choledocholithiasis s/p ERCP with Stent placement, plan for repeat ERCP and possible CBD clearance. As per Dr. Donne Hazel, pt may benefit from an elective open lap chole if pt is able to be stabilized on this hospitalization  FEN: NPO VTE: SCD's, Heparin gtt ID: none, WBC stable, afebrile Foley: none Follow up: TBD  DISPO: ERCP today    LOS:  3 days    Rosario Jacks., Anne Hahn 06/24/2017

## 2017-06-24 NOTE — Progress Notes (Signed)
POST ERCP NOTE:  Pt doing well. No abdominal pain or nausea or melena.  Vitals stable. Abdominal Exam: soft, nontender. Discussed with family.  [name]

## 2017-06-24 NOTE — Progress Notes (Signed)
Heparin on hold starting from 7AM as ordered per MD for surgery scheduled at 11 AM.  Will continue to monitor.  Amee Boothe, RN

## 2017-06-25 DIAGNOSIS — E43 Unspecified severe protein-calorie malnutrition: Secondary | ICD-10-CM

## 2017-06-25 DIAGNOSIS — G459 Transient cerebral ischemic attack, unspecified: Secondary | ICD-10-CM

## 2017-06-25 LAB — APTT: aPTT: 99 seconds — ABNORMAL HIGH (ref 24–36)

## 2017-06-25 LAB — CBC
HCT: 28 % — ABNORMAL LOW (ref 36.0–46.0)
Hemoglobin: 9.1 g/dL — ABNORMAL LOW (ref 12.0–15.0)
MCH: 28.6 pg (ref 26.0–34.0)
MCHC: 32.5 g/dL (ref 30.0–36.0)
MCV: 88.1 fL (ref 78.0–100.0)
Platelets: 215 10*3/uL (ref 150–400)
RBC: 3.18 MIL/uL — ABNORMAL LOW (ref 3.87–5.11)
RDW: 13.9 % (ref 11.5–15.5)
WBC: 5.4 10*3/uL (ref 4.0–10.5)

## 2017-06-25 LAB — CULTURE, BLOOD (ROUTINE X 2)
Culture: NO GROWTH
Culture: NO GROWTH
Special Requests: ADEQUATE
Special Requests: ADEQUATE

## 2017-06-25 LAB — COMPREHENSIVE METABOLIC PANEL
ALT: 207 U/L — ABNORMAL HIGH (ref 14–54)
AST: 41 U/L (ref 15–41)
Albumin: 2.7 g/dL — ABNORMAL LOW (ref 3.5–5.0)
Alkaline Phosphatase: 338 U/L — ABNORMAL HIGH (ref 38–126)
Anion gap: 10 (ref 5–15)
BUN: 9 mg/dL (ref 6–20)
CO2: 27 mmol/L (ref 22–32)
Calcium: 8.2 mg/dL — ABNORMAL LOW (ref 8.9–10.3)
Chloride: 101 mmol/L (ref 101–111)
Creatinine, Ser: 0.75 mg/dL (ref 0.44–1.00)
GFR calc Af Amer: >60 mL/min (ref >60)
GFR calc non Af Amer: >60 mL/min (ref >60)
Glucose, Bld: 106 mg/dL — ABNORMAL HIGH (ref 65–99)
Potassium: 3.4 mmol/L — ABNORMAL LOW (ref 3.5–5.1)
Sodium: 138 mmol/L (ref 135–145)
Total Bilirubin: 0.8 mg/dL (ref 0.3–1.2)
Total Protein: 5.9 g/dL — ABNORMAL LOW (ref 6.5–8.1)

## 2017-06-25 LAB — HEPARIN LEVEL (UNFRACTIONATED)
Heparin Unfractionated: 0.38 IU/mL (ref 0.30–0.70)
Heparin Unfractionated: 0.43 IU/mL (ref 0.30–0.70)

## 2017-06-25 LAB — MAGNESIUM: Magnesium: 1.5 mg/dL — ABNORMAL LOW (ref 1.7–2.4)

## 2017-06-25 MED ORDER — LISINOPRIL 20 MG PO TABS
20.0000 mg | ORAL_TABLET | Freq: Every day | ORAL | Status: DC
  Administered 2017-06-26 – 2017-07-01 (×5): 20 mg via ORAL
  Filled 2017-06-25 (×6): qty 1

## 2017-06-25 MED ORDER — POTASSIUM CHLORIDE CRYS ER 20 MEQ PO TBCR
40.0000 meq | EXTENDED_RELEASE_TABLET | Freq: Once | ORAL | Status: AC
  Administered 2017-06-25: 40 meq via ORAL
  Filled 2017-06-25: qty 2

## 2017-06-25 MED ORDER — MAGNESIUM SULFATE 4 GM/100ML IV SOLN
4.0000 g | Freq: Once | INTRAVENOUS | Status: AC
  Administered 2017-06-25: 4 g via INTRAVENOUS
  Filled 2017-06-25: qty 100

## 2017-06-25 MED ORDER — LISINOPRIL 10 MG PO TABS
10.0000 mg | ORAL_TABLET | Freq: Once | ORAL | Status: AC
  Administered 2017-06-25: 10 mg via ORAL
  Filled 2017-06-25: qty 1

## 2017-06-25 NOTE — Progress Notes (Signed)
Patient with choledocholithiasis and ascending cholangitis status post ERCP with biliary sphincterotomy and stone extraction.  She has been cleared by neurology for cholecystectomy.  Plan: Cholecystectomy as per surgery.  Hold heparin 6 hours before.

## 2017-06-25 NOTE — Progress Notes (Signed)
Patient ID: [name], female   DOB: 12-18-1945, 72 y.o.   MRN: [id number]    1 Day Post-Op  Subjective: Patient sore in the RUQ, but hungry.  She is tolerating clear liquids and wants solid food to eat.  She is requesting we proceed with her operation while she is here instead of as an outpatient.  Objective: Vital signs in last 24 hours: Temp:  [97.8 F (36.6 C)-98.3 F (36.8 C)] 98.3 F (36.8 C) (04/09 0449) Pulse Rate:  [63-89] 69 (04/09 0449) Resp:  [10-19] 18 (04/09 0449) BP: (139-221)/(70-90) 186/70 (04/09 0449) SpO2:  [97 %-100 %] 99 % (04/09 0449) Weight:  [46.2 kg (101 lb 12.8 oz)-47.3 kg (104 lb 3.2 oz)] 46.2 kg (101 lb 12.8 oz) (04/09 0449) Last BM Date: 06/24/17  Intake/Output from previous day: 04/08 0701 - 04/09 0700 In: 3282.3 [P.O.:1050; I.V.:2132.3; IV Piggyback:100] Out: 3900 [Urine:3900] Intake/Output this shift: No intake/output data recorded.  PE: Abd: soft, mildly tender in RUQ, +BS, ND  Lab Results:  Recent Labs    06/24/17 0752 06/25/17 0228  WBC 6.8 5.4  HGB 8.9* 9.1*  HCT 27.6* 28.0*  PLT 197 215   BMET Recent Labs    06/24/17 0752 06/25/17 0228  NA 138 138  K 3.1* 3.4*  CL 104 101  CO2 25 27  GLUCOSE 104* 106*  BUN 9 9  CREATININE 0.85 0.75  CALCIUM 7.9* 8.2*   PT/INR No results for input(s): LABPROT, INR in the last 72 hours. CMP     Component Value Date/Time   NA 138 06/25/2017 0228   K 3.4 (L) 06/25/2017 0228   CL 101 06/25/2017 0228   CO2 27 06/25/2017 0228   GLUCOSE 106 (H) 06/25/2017 0228   GLUCOSE 104 (H) 03/11/2006 1059   BUN 9 06/25/2017 0228   CREATININE 0.75 06/25/2017 0228   CREATININE 0.91 03/16/2016 1539   CALCIUM 8.2 (L) 06/25/2017 0228   PROT 5.9 (L) 06/25/2017 0228   ALBUMIN 2.7 (L) 06/25/2017 0228   AST 41 06/25/2017 0228   ALT 207 (H) 06/25/2017 0228   ALKPHOS 338 (H) 06/25/2017 0228   BILITOT 0.8 06/25/2017 0228   GFRNONAA >60 06/25/2017 0228   GFRAA >60 06/25/2017 0228   Lipase       Component Value Date/Time   LIPASE 8 (L) 12/11/2011 1455       Studies/Results: Dg Ercp Biliary & Pancreatic Ducts  Result Date: 06/24/2017 CLINICAL DATA:  Common bile duct calculi EXAM: ERCP TECHNIQUE: Multiple spot images obtained with the fluoroscopic device and submitted for interpretation post-procedure. COMPARISON:  ERCP-06/23/2017 FLUOROSCOPY TIME:  1 minute FINDINGS: Three spot intraoperative fluoroscopic images of the right upper abdominal quadrant during ERCP are provided for review Images demonstrate removal of internal plastic biliary stent. There is opacification of the common bile duct which again appears mild to moderately dilated. Subsequent images demonstrate insufflation of a balloon within distal aspect of the CBD with subsequent presumed biliary sweeping and sphincterotomy. There is minimal opacification the central aspect of the biliary tree which appears nondilated. There is opacification of the cystic duct as well as the gallbladder. Several persistent filling defects are seen within the gallbladder suggestive of cholelithiasis. IMPRESSION: 1. ERCP with biliary stent removal and presumed biliary sweeping and sphincterotomy as detailed above. 2. Suspected cholelithiasis. These images were submitted for radiologic interpretation only. Please see the procedural report for the amount of contrast and the fluoroscopy time utilized. Electronically Signed   By: Eldridge Abrahams.D.  On: 06/24/2017 12:06   Dg Ercp Biliary & Pancreatic Ducts  Result Date: 06/23/2017 CLINICAL DATA:  73 year old female with choledocholithiasis EXAM: ERCP TECHNIQUE: Multiple spot images obtained with the fluoroscopic device and submitted for interpretation post-procedure. FLUOROSCOPY TIME:  Fluoroscopy Time:  0 minutes 28 seconds COMPARISON:  None. FINDINGS: Three saved images are submitted for review. The images demonstrate a flexible endoscope in the descending duodenum with wire cannulation of the common bile  duct. Cholangiogram demonstrates numerous filling defects in the common duct consistent with choledocholithiasis. On the final image, a plastic biliary stent has been placed. IMPRESSION: 1. Choledocholithiasis. 2. ERCP with placement of a plastic biliary stent. These images were submitted for radiologic interpretation only. Please see the procedural report for the amount of contrast and the fluoroscopy time utilized. Electronically Signed   By: Jacqulynn Cadet M.D.   On: 06/23/2017 10:18    Anti-infectives: Anti-infectives (From admission, onward)   Start     Dose/Rate Route Frequency Ordered Stop   06/24/17 1500  levofloxacin (LEVAQUIN) IVPB 500 mg     500 mg 100 mL/hr over 60 Minutes Intravenous Daily-1800 06/24/17 1327         Assessment/Plan Principal Problem: Weakness Active Problems: ANEMIA-IRON DEFICIENCY Essential hypertension Atrial fibrillation/flutter - on Eliquis LBBB - pacemaker COPD with emphysema (HCC) GERD (gastroesophageal reflux disease) Chronic diastolic CHF (congestive heart failure) (HCC) TIA (transient ischemic attack) - 05/31/17 AKI (acute kidney injury) (HCC) Abnormal LFTs Hypotension HLD (hyperlipidemia) CAD (coronary artery disease) Protein-calorie malnutrition, severe (HCC) Extensive abdominal surgical history  Cholelithiasis/Choledocholithiasis -s/p ERCP with Stent placement and repeat ERCP with clearance of her duct and sphincterotomy -patient really would like to have her surgery done while she is here as an inpatient.  We discussed the initial plan and why the plan was for elective outpatient cholecystectomy.  Given some new concern for possible cholangitis yesterday with stone removal (although she clinically did not appear to have cholangitis) it does call for a rediscussion of benefit to the patient for surgery now or delayed.  If we proceed this admission, she will likely need neuro to see her as she had a TIA  just 3-4 weeks ago to evaluate her surgical risks as well as probably cards to clear her as well.  I will discuss with Dr. Rosendo Gros for his final thoughts and recommendations  FEN:cLD, may have low fat diet from our standpoint VTE: SCD's,Heparin gtt ID: WBC stable, afebrile, Levaquin pre-ERCP Foley:none Follow up:TBD  DISPO:discussion for inpatient vs outpatient cholecystectomy.       LOS: 4 days    Henreitta Cea , [facility] 06/25/2017, 7:57 AM Pager: [phone]

## 2017-06-25 NOTE — Progress Notes (Signed)
ANTICOAGULATION CONSULT NOTE - Follow Up Consult  Pharmacy Consult for heparin Indication: atrial fibrillation  Labs: Recent Labs    06/23/17 1115 06/23/17 2217 06/24/17 0752 06/25/17 0228  HGB  --   --  8.9* 9.1*  HCT  --   --  27.6* 28.0*  PLT  --   --  197 215  APTT  --  34 59* 99*  HEPARINUNFRC  --  0.10* 0.21* 0.38  CREATININE 0.75  --  0.85 0.75    Assessment/Plan:  72yo female therapeutic on heparin after resumed. Will continue gtt at current rate and confirm stable with additional level.   Wynona Neat, PharmD, BCPS  06/25/2017,3:52 AM

## 2017-06-25 NOTE — Consult Note (Addendum)
Referring Physician: Eugenie Filler, MD    Chief Complaint: Consult for neuro clearance   HPI: [name] is an 72 y.o. female with past medical history significant for  Hx of atrial fibrillation, Chronic Diastolic CHF- EF 24-40% hypertension, LBBB with pacemaker placement, gastric ulcer, GI bleed (ASA stopped), recent TIA on 05/30/2017, started on Eliquis at that time.  On 05/30/2017, patient presented to the ED with complaints of  of slurred speech and right hand, right-arm and leg weakness and numbness that she noticed on waking up Wednesday 05/28/17.  She noticed she could not grasp things with her right hand and kept dropping things, as well  were waxing and waning, she went to see her PCP, who sent her to the ER for evaluation. Patient states symptoms resolved completely upon coming to the ED.  During that admission, TIA workup was negative, initial CT of the head was negative for acute intracranial abnormality.  MRI could not be done due to patient's pacemaker.  Repeat CT of the head was negative for acute intracranial abnormality.  Due to patient's history of GI bleed neurology obtained [facility] from GI and patient was subsequently started on Eliquis.  She was discharged on Eliquis and Lipitor and has not had any further stroke/TIA complaints.  Patient presented again to the ED on 06/19/2017 with complaints of weakness, frontal headache, lightheadedness and dizziness and stated she had spilled tea in a cup she was holding in her right hand.  On admission to the ER she denied any unilateral weakness numbness tingling, facial droop slurred speech is vision changes.  On admission blood pressure was 73/47 which responded to 1 L normal saline.  Significant findings in the ED noted elevated LFTs and CT of the head was negative for any intracranial abnormalities at that time.  In working up her elevated LFTs, ultrasound showed gallbladder sludge with dilated common bile duct and intrahepatic biliary  ducts.  Due to the need for upcoming procedures, patient's Eliquis was stopped and she was started on a heparin drip.  Patient is s/p ERCP with stent placement and another repeat ERCP with clearance of her duct and sphincterectomy. There was some new concern for possible cholangitis during stone removal during her ERCP and discussion about cholecystectomy was done. Patient is requesting to have it done during this admission.  Neuro was consulted for clearance for surgery since patient had a recent TIA.  The patient is awake, alert and oriented x3 without any acute neuro or motor symptoms at this time.   Past Medical History:  Diagnosis Date  . Anemia   . Anginal pain ([place])   . Arthritis    right hip  . Atrial fibrillation ([place])   . Blood transfusion   . CAD (coronary artery disease)    mild disease per cath in 2011  . CHF (congestive heart failure) ([place])   . Chronic abdominal pain   . Chronic nausea   . Chronic pain syndrome   . COPD (chronic obstructive pulmonary disease) ([facility])    continuous O2- 2l  . Diverticulitis   . Elbow fracture, left aug 2012  . Elevated liver enzymes   . Fibromyalgia    COUPLE OF TIMES A YEAR-PT HAS EPISODES OF CONFUSION-USUALLY INVOLVES DAY/NIGHT REVERSAL AND EPISODES OF TWITCHING AND FALLS--SEES DR. Jacelyn Grip - NEUROLOGIST-LAST SEEN 07/10/11  . Gastroparesis   . GERD (gastroesophageal reflux disease)   . H/O: GI bleed    from Pradaxa  . Hepatitis    hx of  in high school   . HTN (hypertension)    under control; has been on med. x "years"  . Hx MRSA infection   . Hx of gastric ulcer   . Hx of stress fx aug 2011   right hip   . LBBB (left bundle branch block)   . Oxygen dependent    2 liters via nasal cannula at all times  . Pacemaker    CRT therapy; followed by Dr. Caryl Comes  . Pleural effusion     s/p right thoracentesis 03/09  . Pneumonia - 03/09, 12/09, 02/10, 06/10   MOST RECENT FEB 2013  . Primary dilated cardiomyopathy (H[facility])    EF 45 to 50% per  echo in Jan 2012  . RLS (restless legs syndrome)   . Silent aspiration   . Small bowel obstruction ([place])   . Urinary incontinence     -INTERSTIM IMPLANT NOT FUNCTIONING PER PT  . Venous embolism and thrombosis of subclavian vein ([place])    after pacemaker insertion Oct 2011  . Vitamin B12 deficiency     Past Surgical History:  Procedure Laterality Date  . APPENDECTOMY    . CARDIAC CATHETERIZATION  04/08/2006, 11/16/2009  . COLECTOMY     Intestinal resection (5times)  . COLECTOMY  02/04/2000   ex. lap., intra-abd. subtotal colectomy with ileosigmoid colon anastomosies and lysis of adhesions  . CYSTOSCOPY  11/11/2008  . CYSTOSCOPY W/ RETROGRADES  11/11/2008   right  . CYSTOSCOPY WITH INJECTION  04/30/2006   transurethral collagen injection; incision vaginal stenosis  . ERCP N/A 06/23/2017   Procedure: ENDOSCOPIC RETROGRADE CHOLANGIOPANCREATOGRAPHY (ERCP);  Surgeon: Carol Ada, MD;  Location: [place];  Service: Endoscopy;  Laterality: N/A;  . ERCP N/A 06/24/2017   Procedure: ENDOSCOPIC RETROGRADE CHOLANGIOPANCREATOGRAPHY (ERCP);  Surgeon: [name], MD;  Location[facility];  Service: Endoscopy;  Laterality: N/A;  . ESOPHAGOGASTRODUODENOSCOPY N/A 02/02/2013   Procedure: ESOPHAGOGASTRODUODENOSCOPY (EGD);  Surgeon: Lafayette Dragon, MD;  Location: [facility];  Service: Endoscopy;  Laterality: N/A;  . EXPLORATORY LAPAROTOMY  04/27/2009   lysis of adhesions, gastrostomy tube  . GASTROCUTANEOUS FISTULA CLOSURE    . INTERSTIM IMPLANT PLACEMENT  05/28/2006 - stage I   06/05/2006 - stage II  . INTERSTIM IMPLANT PLACEMENT  02/05/2012   Procedure: [name] IMPLANT FIRST STAGE;  Surgeon: Reece Packer, MD;  Location: [facility];  Service: Urology;  Laterality: N/A;  Replacement of Interstim Lead     . INTERSTIM IMPLANT REVISION  03/06/2011   Procedure: REVISION OF [name];  Surgeon: Reece Packer, MD;  Location: [facility];  Service: Urology;  Laterality: N/A;  Replacement of  Neurostimulator  . INTERSTIM IMPLANT REVISION  10/23/2007  . PACEMAKER PLACEMENT  12/21/2009  . Peg removed     with complications  . PORT-A-CATH REMOVAL  07/27/2011   Procedure: REMOVAL PORT-A-CATH;  Surgeon: Rolm Bookbinder, MD;  Location: [place];  Service: General;  Laterality: N/A;  . PORTACATH PLACEMENT  12/16/2009  . SAVORY DILATION N/A 02/02/2013   Procedure: SAVORY DILATION;  Surgeon: Lafayette Dragon, MD;  Location: [facility];  Service: Endoscopy;  Laterality: N/A;  . SMALL INTESTINE SURGERY  05/20/2001   ex. lap., resection of small bowel stricture; gastrostomy; insertion central line  . TONSILLECTOMY  age 68  . TOTAL ABDOMINAL HYSTERECTOMY     complete  . TOTAL HIP ARTHROPLASTY  08/03/2011   Procedure: TOTAL HIP ARTHROPLASTY ANTERIOR APPROACH;  Surgeon: Mcarthur Rossetti, MD;  Location: [facility];  Service: Orthopedics;  Laterality: Right;    Family History  Problem Relation Age of Onset  . Heart disease Father   . Stroke Mother   . Heart disease Mother   . Heart disease Brother   . Breast cancer Maternal Aunt   . Heart disease Paternal Aunt   . Diabetes Paternal Grandfather   . Colon cancer Neg Hx    Social History:  reports that she quit smoking about 31 years ago. She has a 20.00 pack-year smoking history. She has never used smokeless tobacco. She reports that she does not drink alcohol or use drugs.  Allergies:  Allergies  Allergen Reactions  . Dabigatran Etexilate Mesylate Other (See Comments)    INTERNAL BLEEDING-pradaxa  . Talwin [Pentazocine] Other (See Comments)    hallucinations  . Lactate Rash    Medications:  . carvedilol  25 mg Oral BID  . furosemide  40 mg Oral q morning - 10a  . gabapentin  600 mg Oral TID  . lisinopril  10 mg Oral Daily  . pantoprazole  40 mg Oral BID  . potassium chloride  40 mEq Oral Once  . tiotropium  1 capsule Inhalation Daily   ROS: 13 point systems reviewed with patient and are negative except for  above in HPI  Physical Examination: Blood pressure (!) 186/70, pulse 69, temperature 98.3 F (36.8 C), temperature source Oral, resp. rate 18, height 5' (1.524 m), weight 46.2 kg (101 lb 12.8 oz), SpO2 99 %. HEENT-  Normocephalic, no lesions, without obvious abnormality.  Normal external eye and conjunctiva.   Cardiovascular- S1-S2 audible, pulses palpable throughout   Lungs-no rhonchi or wheezing noted, no excessive working breathing.  Saturations within normal limits Abdomen- All 4 quadrants palpated and nontender Musculoskeletal-no joint tenderness, deformity or swelling Skin-warm and dry, no hyperpigmentation, vitiligo, or suspicious lesions  Neurological Examination Mental Status: Alert, oriented, thought content appropriate.  Speech fluent without evidence of aphasia.  Able to follow 3 step commands without difficulty. Cranial Nerves: II: Visual fields grossly normal,  III,IV, VI: ptosis not present, extra-ocular motions intact bilaterally pupils equal, round, reactive to light and accommodation V,VII: smile symmetric, facial light touch sensation normal bilaterally VIII: Hearing intact to voice IX,X: uvula rises symmetrically XI: bilateral shoulder shrug XII: midline tongue extension Motor: Right : Upper extremity   5/5    Left:     Upper extremity   5/5  Lower extremity   5/5     Lower extremity   5/5 Tone and bulk:normal tone throughout; no atrophy noted Sensory: Pinprick and light touch intact throughout, bilaterally Deep Tendon Reflexes: 2+ and symmetric throughout Plantars: Right: downgoing   Left: downgoing Cerebellar: normal finger-to-nose, normal rapid alternating movements and normal heel-to-shin test Gait: normal gait and station  NIHSS TOTAL: 0  Assessment: 72 y.o. female  with past medical history significant for  Hx of atrial fibrillation, Chronic Diastolic CHF- EF 03-50% hypertension, LBBB with pacemaker placement, gastric ulcer, GI bleed (ASA stopped),  recent TIA on 05/30/2017.  Patient has not had any further stroke/TIA symptoms since discharge.  She presents this admission with severe hypotension as well as elevated LFTs.  Findings revealed cholelithiasis/choledocholithiasis.  Patient status post ERCP with stent placement and another repeat ERCP with clearance of her duct and sphincterectomy.  Due to the findings of cholangitis during her ERCP and possible benefit of cholecystectomy, patient requests to have surgery during this admission. Neurology consulted for surgery clearance in the setting of recent TIA and atrial fibrillation.  Stroke Risk  Factors - atrial fibrillation, family history, hyperlipidemia and hypertension Patient currently on heparin due to Eliquis being held for procedures.  Recommendations: 1. Given recent TIA and atrial fibrillation, the patient is at increased risk for a periprocedural or perioperative stroke. However, this risk is most likely outweighed by the benefits of surgery. The patient feels strongly that she wants to proceed, despite her increased risk of perioperative stroke relative to surgical patients without prior history of stroke.  2. Continue heparin for now and restart on oral anticoagulation following the procedure.  3. Outpatient Neurology follow up   History and exam documented by Jacob Moores DNP, Neuro-hospitalist Team [phone] 06/25/2017, 10:50 AM  I have seen and examined the patient. I have amended the assessment and recommendations above.  Electronically signed: Dr. Kerney Elbe

## 2017-06-25 NOTE — Progress Notes (Addendum)
Nutrition Follow-up  DOCUMENTATION CODES:   Not applicable  INTERVENTION:  Magic cup TID with meals, each supplement provides 290 kcal and 9 grams of protein - no vanilla.    NUTRITION DIAGNOSIS:   Increased nutrient needs related to chronic illness as evidenced by estimated needs.  GOAL:   Patient will meet greater than or equal to 90% of their needs  MONITOR:   PO intake, Diet advancement, Weight trends, Labs, I & O's  REASON FOR ASSESSMENT:   Consult Assessment of nutrition requirement/status  ASSESSMENT:   72 y.o. F admitted for weakness and lightheadedness due to possible stroke with recent TIA and UTI. PMH of hypertension, hyperlipidemia, COPD, bronchiectasis, GERD, a.fib, CHF, CAD, pacemaker, left bundle blockage, gastric uler, GI bleeding, anemia, psoriasis, abnormal liver function. PSH of appendectomy, PEG removal with complications, SI surgery (resection of small bowel, gastrostomy), and colectomy.    Spoke with pt about how she is eating and her current appetite. Pt reports not eating too much currently as her appetite has decreased after her procedure; she had tomato soup, ice cream, and jello today. She reports tolerating the food well and has progressed to full liquids from clears this morning, however, the food is "going right through me". Pt still does not want a supplement, but is open to trying a magic cup.   Medications reviewed: lasix, neurontin, protonix.   Labs reviewed: K+ 3.4 (L), BG 106 (H), magnesium 1.5 (L), ALT 207 (H), RBC 3.18 (L), hemoglobin 9.1 (L), HCT 28 (L), APTT 99 (H).     NUTRITION - FOCUSED PHYSICAL EXAM:    Most Recent Value  Orbital Region  No depletion  Upper Arm Region  Mild depletion  Thoracic and Lumbar Region  No depletion  Buccal Region  No depletion  Temple Region  Mild depletion  Clavicle Bone Region  No depletion  Clavicle and Acromion Bone Region  Mild depletion  Scapular Bone Region  Unable to assess  Dorsal Hand   Mild depletion  Patellar Region  Mild depletion  Anterior Thigh Region  Mild depletion  Posterior Calf Region  Mild depletion  Edema (RD Assessment)  None  Hair  Reviewed  Eyes  Reviewed  Mouth  Reviewed  Skin  Reviewed  Nails  Reviewed       Diet Order:  Diet full liquid Room service appropriate? Yes; Fluid consistency: Thin  EDUCATION NEEDS:   Education needs have been addressed(Discussed with pt that her nutrient needs are increased due to her multiple intestinal resections and COPD. )  Skin:  Skin Assessment: Reviewed RN Assessment  Last BM:  06/24/17  Height:   Ht Readings from Last 1 Encounters:  06/24/17 5' (1.524 m)    Weight:   Wt Readings from Last 1 Encounters:  06/25/17 101 lb 12.8 oz (46.2 kg)    Ideal Body Weight:  45.45 kg  BMI:  Body mass index is 19.88 kg/m.  Estimated Nutritional Needs:   Kcal:  1250-1350 kcal  Protein:  60-70 grams  Fluid:  > 1.2 L   Hope Budds, Dietetic Intern

## 2017-06-25 NOTE — Consult Note (Signed)
[facility] CM Inpatient Consult   06/25/2017  [name] 09/08/1945 [id number]  Patient screened for re-admission in the [facility] Care Management for services in the [employer] plan.  Admitted with gallbladder issues and HX of weakness.   Met with the patient, husband and family at bedside to explain [facility] Care Management services in network.     Patient endorses Dr. Yvonne Lowne Chase as her primary care provider.  This office provides post hospital transition of care follow up.  Patient denies any issues with transportation, resource needs or pharmacy issues.  She states she would accept the information.  No needs identified.  States, "I am finally getting something other than clear liquids and I am doing okay with it so far. A brochure, 24 hour nurse adviseline magnet and contact information was given.   Please place a [facility] Care Management consult or for questions contact:    , RN BSN CCM [facility] Liaison  [phone] business mobile phone Toll free office [phone]   

## 2017-06-25 NOTE — Progress Notes (Signed)
PROGRESS NOTE    [name]  KDT:[id number] DOB: [birth year] DOA: 06/19/2017 PCP: Ann Held, DO    Brief Narrative:  [name] is a 72 y.o. femalewith medical history significant ofhypertension, hyperlipidemia, COPD, bronchiectasis, GERD, atrial fibrillation on Eliquis, pacemaker placement, left bundle blockage, gastric ulcer, GI bleeding, dCHF, anemia, psoriasis, abnormal liver function. She presented with lightheadedness found to have hypotension. Now with elevated LFTs secondary to choledocholithiasis.     Assessment & Plan:   Principal Problem:   Weakness Active Problems:   ANEMIA-IRON DEFICIENCY   Essential hypertension   Atrial fibrillation/flutter   COPD with emphysema (HCC)   GERD (gastroesophageal reflux disease)   Chronic diastolic CHF (congestive heart failure) (HCC)   TIA (transient ischemic attack)   AKI (acute kidney injury) (HCC)   Abnormal LFTs   Hypotension   HLD (hyperlipidemia)   CAD (coronary artery disease)   Protein-calorie malnutrition, severe (HCC)   Elevated ALT measurement  #1 acute cholecystitis/choledocholithiasis and ascending cholangitis Patient noted to have transaminitis during the hospitalization with elevated bilirubin.  Right upper quadrant ultrasound which was done was significant for cholelithiasis.  There is concern for distal common bile duct stone.  Patient seen in consultation by gastroenterology on 06/20/2017 who had recommended surgical input.  HIDA scan was done which was concerning for questionable pancreatic mass/tumor.  CT scan which was done showed abnormal appearing gallbladder with concern for acute cholecystitis.  Patient subsequently underwent a ERCP with sphincterectomy and biliary stent placement and stone extraction.  Patient wanting cholecystectomy during his hospitalization and due to recent TIA neurology has been consulted for clearance.  Continue empiric IV Levaquin.  GI and general surgery following and  appreciate input and recommendations.  2.  Hypotension Spondee to IV fluids.  Resolved.  3.  Hypertension BP still somewhat elevated.  Continue Coreg, Lasix.  Will increase lisinopril to 20 mg daily for better blood pressure control.  Hydralazine as needed for systolic blood pressure greater than 180.  Follow.  4.  Esophageal reflux disease PPI.  5.  COPD stable Continue Spiriva.  Nebs as needed.  6.  Severe protein calorie malnutrition Continue nutritional supplementation.  7.  Atrial fibrillation Continue Coreg for rate control.  Eliquis on hold in anticipation of cholecystectomy.  Continue heparin for anticoagulation.  8.  Acute kidney injury Resolved with hydration.  9.  History of TIA Was on Eliquis and Lipitor as outpatient.  Anticipation for possible cholecystectomy and as such Eliquis on hold.  Continue IV heparin.  Follow.  10.  Hyperlipidemia Continue to hold statin.  11.  Status post left bundle branch block/chronic diastolic heart failure/artery artery disease Status post PPM.  Continue Coreg, Lasix, ACE inhibitor.  Stable.  12.  Chronic pain Continue home dose pain regimen.   DVT prophylaxis: Heparin Code Status: Full Family Communication: Updated patient.  No family at bedside. Disposition Plan: Pending open Cholecystectomy and per general surgery.   Consultants:   General surgery: Dr. Brantley Stage 06/20/2017  Gastroenterology: Dr. Lyndel Safe 06/21/2017  Neurology pending  Procedures:   ERCP 06/23/2017, 06/24/2017  CT abdomen and pelvis 06/21/2017  CT head 06/20/2017  Chest x-ray 06/20/2017  ERCP with placement of plastic biliary stent/choledocholithiasis 06/23/2017 per Dr. Lyndel Safe  ERCP with biliary stent removal and presumed biliary sweeping and sphincterotomy.  Suspected cholelithiasis.  Dr. Lyndel Safe 07/04/2017  HIDA scan 06/21/2017  Antimicrobials:   IV Levaquin 07/04/2017   Subjective: Denies any chest pain or shortness of breath.  Patient states some  of upper  abdominal pain.  No nausea or emesis.  Patient wanting cholecystectomy to be done during this hospitalization.  Objective: Vitals:   06/25/17 0008 06/25/17 0449 06/25/17 1104 06/25/17 1144  BP: (!) 169/80 (!) 186/70 (!) 178/82 (!) 167/64  Pulse: 63 69 72 68  Resp: 18 18  18   Temp: 97.9 F (36.6 C) 98.3 F (36.8 C)  98.5 F (36.9 C)  TempSrc: Oral Oral  Oral  SpO2: 100% 99% 100% 100%  Weight:  46.2 kg (101 lb 12.8 oz)    Height:        Intake/Output Summary (Last 24 hours) at 06/25/2017 1337 Last data filed at 06/25/2017 1312 Gross per 24 hour  Intake 3103.45 ml  Output 4600 ml  Net -1496.55 ml   Filed Weights   06/24/17 0500 06/24/17 1017 06/25/17 0449  Weight: 47.3 kg (104 lb 3.2 oz) 47.3 kg (104 lb 3.2 oz) 46.2 kg (101 lb 12.8 oz)    Examination:  General exam: Appears calm and comfortable  Respiratory system: Clear to auscultation. Respiratory effort normal. Cardiovascular system: S1 & S2 heard, RRR. No JVD, murmurs, rubs, gallops or clicks. No pedal edema. Gastrointestinal system: Abdomen is nondistended, soft and some tenderness to palpation in the right upper quadrant and epigastric region.  No organomegaly or masses felt. Normal bowel sounds heard. Central nervous system: Alert and oriented. No focal neurological deficits. Extremities: Symmetric 5 x 5 power. Skin: No rashes, lesions or ulcers Psychiatry: Judgement and insight appear normal. Mood & affect appropriate.     Data Reviewed: I have personally reviewed following labs and imaging studies  CBC: Recent Labs  Lab 06/19/17 2315 06/19/17 2336 06/20/17 0351 06/21/17 0805 06/24/17 0752 06/25/17 0228  WBC 11.2*  --  10.3 5.9 6.8 5.4  NEUTROABS 9.4*  --   --   --   --   --   HGB 10.1* 10.5* 9.0* 9.3* 8.9* 9.1*  HCT 31.5* 31.0* 28.9* 29.6* 27.6* 28.0*  MCV 91.0  --  90.6 89.4 88.7 88.1  PLT 272  --  211 217 197 627   Basic Metabolic Panel: Recent Labs  Lab 06/21/17 1624 06/22/17 0915 06/23/17 1115  06/24/17 0752 06/25/17 0228  NA 136 138 138 138 138  K 4.3 4.2 3.5 3.1* 3.4*  CL 104 106 104 104 101  CO2 22 22 22 25 27   GLUCOSE 115* 89 128* 104* 106*  BUN 11 12 7 9 9   CREATININE 0.67 0.75 0.75 0.85 0.75  CALCIUM 8.7* 8.6* 8.3* 7.9* 8.2*  MG  --   --   --   --  1.5*   GFR: Estimated Creatinine Clearance: 45.7 mL/min (by C-G formula based on SCr of 0.75 mg/dL). Liver Function Tests: Recent Labs  Lab 06/21/17 1624 06/22/17 0915 06/23/17 1115 06/24/17 0752 06/25/17 0228  AST 1,257* 586* 171* 63* 41  ALT 829* 651* 434* 272* 207*  ALKPHOS 617* 557* 467* 364* 338*  BILITOT 2.0* 1.0 0.7 0.6 0.8  PROT 6.0* 6.4* 6.4* 5.8* 5.9*  ALBUMIN 2.7* 2.8* 2.8* 2.5* 2.7*   No results for input(s): LIPASE, AMYLASE in the last 168 hours. No results for input(s): AMMONIA in the last 168 hours. Coagulation Profile: Recent Labs  Lab 06/19/17 2315  INR 2.28   Cardiac Enzymes: No results for input(s): CKTOTAL, CKMB, CKMBINDEX, TROPONINI in the last 168 hours. BNP (last 3 results) No results for input(s): PROBNP in the last 8760 hours. HbA1C: No results for input(s): HGBA1C in the last  72 hours. CBG: No results for input(s): GLUCAP in the last 168 hours. Lipid Profile: No results for input(s): CHOL, HDL, LDLCALC, TRIG, CHOLHDL, LDLDIRECT in the last 72 hours. Thyroid Function Tests: No results for input(s): TSH, T4TOTAL, FREET4, T3FREE, THYROIDAB in the last 72 hours. Anemia Panel: No results for input(s): VITAMINB12, FOLATE, FERRITIN, TIBC, IRON, RETICCTPCT in the last 72 hours. Sepsis Labs: Recent Labs  Lab 06/20/17 0351  PROCALCITON 0.70    Recent Results (from the past 240 hour(s))  Culture, blood (Routine X 2) w Reflex to ID Panel     Status: None   Collection Time: 06/20/17  3:51 AM  Result Value Ref Range Status   Specimen Description BLOOD LEFT HAND  Final   Special Requests IN PEDIATRIC BOTTLE Blood Culture adequate volume  Final   Culture   Final    NO GROWTH 5  DAYS Performed at [facility], [place] [address] [place], [place] [address]    Report Status 06/25/2017 FINAL  Final  Culture, blood (Routine X 2) w Reflex to ID Panel     Status: None   Collection Time: 06/20/17  3:51 AM  Result Value Ref Range Status   Specimen Description BLOOD RIGHT HAND  Final   Special Requests   Final    BOTTLES DRAWN AEROBIC AND ANAEROBIC Blood Culture adequate volume   Culture   Final    NO GROWTH 5 DAYS Performed at [facility], [place] [address], [place], [place] [address]    Report Status 06/25/2017 FINAL  Final         Radiology Studies: Dg Ercp Biliary & Pancreatic Ducts  Result Date: 06/24/2017 CLINICAL DATA:  Common bile duct calculi EXAM: ERCP TECHNIQUE: Multiple spot images obtained with the fluoroscopic device and submitted for interpretation post-procedure. COMPARISON:  ERCP-06/23/2017 FLUOROSCOPY TIME:  1 minute FINDINGS: Three spot intraoperative fluoroscopic images of the right upper abdominal quadrant during ERCP are provided for review Images demonstrate removal of internal plastic biliary stent. There is opacification of the common bile duct which again appears mild to moderately dilated. Subsequent images demonstrate insufflation of a balloon within distal aspect of the CBD with subsequent presumed biliary sweeping and sphincterotomy. There is minimal opacification the central aspect of the biliary tree which appears nondilated. There is opacification of the cystic duct as well as the gallbladder. Several persistent filling defects are seen within the gallbladder suggestive of cholelithiasis. IMPRESSION: 1. ERCP with biliary stent removal and presumed biliary sweeping and sphincterotomy as detailed above. 2. Suspected cholelithiasis. These images were submitted for radiologic interpretation only. Please see the procedural report for the amount of contrast and the fluoroscopy time utilized. Electronically Signed   By: Sandi Mariscal M.D.   On:  06/24/2017 12:06        Scheduled Meds: . carvedilol  25 mg Oral BID  . furosemide  40 mg Oral q morning - 10a  . gabapentin  600 mg Oral TID  . lisinopril  10 mg Oral Daily  . pantoprazole  40 mg Oral BID  . tiotropium  1 capsule Inhalation Daily   Continuous Infusions: . sodium chloride 75 mL/hr at 06/25/17 0614  . heparin 900 Units/hr (06/24/17 1719)  . levofloxacin (LEVAQUIN) IV Stopped (06/24/17 1541)  . magnesium sulfate 1 - 4 g bolus IVPB       LOS: 4 days    Time spent: 35 minutes    Irine Seal, MD [facility] Pager [phone]  If  7PM-7AM, please contact night-coverage [email or web address] Password [email or web address] 06/25/2017, 1:37 PM

## 2017-06-26 LAB — COMPREHENSIVE METABOLIC PANEL
ALT: 136 U/L — ABNORMAL HIGH (ref 14–54)
AST: 30 U/L (ref 15–41)
Albumin: 2.6 g/dL — ABNORMAL LOW (ref 3.5–5.0)
Alkaline Phosphatase: 284 U/L — ABNORMAL HIGH (ref 38–126)
Anion gap: 10 (ref 5–15)
BUN: 6 mg/dL (ref 6–20)
CO2: 30 mmol/L (ref 22–32)
Calcium: 8.1 mg/dL — ABNORMAL LOW (ref 8.9–10.3)
Chloride: 101 mmol/L (ref 101–111)
Creatinine, Ser: 0.83 mg/dL (ref 0.44–1.00)
GFR calc Af Amer: >60 mL/min (ref >60)
GFR calc non Af Amer: >60 mL/min (ref >60)
Glucose, Bld: 74 mg/dL (ref 65–99)
Potassium: 3.5 mmol/L (ref 3.5–5.1)
Sodium: 141 mmol/L (ref 135–145)
Total Bilirubin: 0.3 mg/dL (ref 0.3–1.2)
Total Protein: 5.6 g/dL — ABNORMAL LOW (ref 6.5–8.1)

## 2017-06-26 LAB — HEPARIN LEVEL (UNFRACTIONATED): Heparin Unfractionated: 0.55 IU/mL (ref 0.30–0.70)

## 2017-06-26 LAB — CBC
HCT: 28.3 % — ABNORMAL LOW (ref 36.0–46.0)
Hemoglobin: 8.8 g/dL — ABNORMAL LOW (ref 12.0–15.0)
MCH: 27.7 pg (ref 26.0–34.0)
MCHC: 31.1 g/dL (ref 30.0–36.0)
MCV: 89 fL (ref 78.0–100.0)
Platelets: 223 10*3/uL (ref 150–400)
RBC: 3.18 MIL/uL — ABNORMAL LOW (ref 3.87–5.11)
RDW: 14.3 % (ref 11.5–15.5)
WBC: 6 10*3/uL (ref 4.0–10.5)

## 2017-06-26 MED ORDER — HEPARIN (PORCINE) IN NACL 100-0.45 UNIT/ML-% IJ SOLN
900.0000 [IU]/h | INTRAMUSCULAR | Status: AC
  Administered 2017-06-26: 900 [IU]/h via INTRAVENOUS

## 2017-06-26 MED ORDER — HEPARIN (PORCINE) IN NACL 100-0.45 UNIT/ML-% IJ SOLN: 900.0000 [IU]/h | INTRAMUSCULAR | Status: DC

## 2017-06-26 NOTE — Progress Notes (Signed)
[facility] for Heparin Indication: atrial fibrillation  Allergies  Allergen Reactions  . Dabigatran Etexilate Mesylate Other (See Comments)    INTERNAL BLEEDING-pradaxa  . Talwin [Pentazocine] Other (See Comments)    hallucinations  . Lactate Rash   Patient Measurements: Height: 5' (152.4 cm) Weight: 100 lb 12.8 oz (45.7 kg) IBW/kg (Calculated) : 45.5 Heparin Dosing Weight: 46kg  Vital Signs: Temp: 98.3 F (36.8 C) (04/10 0440) Temp Source: Oral (04/10 0440) BP: 169/88 (04/10 0440) Pulse Rate: 71 (04/10 0440)  Assessment: 72 yo female w/ recent TIA, readmitted for recurrent symptoms.  She is on apixaban PTA for afib, currently on hold. Patient s/p  ERCP 4/6, heparin was resumed awaiting cholecystectomy.  Heparin to be held starting 6 hours prior to procedure. Neurology advises to resume oral anticoagulation post procedure. CBC stable, HgB 8.8, no overt bleeding reported. Heparin level remains therapeutic.  Goal of Therapy:  Heparin level 0.3-0.7 units/ml aPTT 66-102 seconds Monitor platelets by anticoagulation protocol: Yes   Plan:  -Heparin gtt at 900 units/hr  -Heparin level/ CBC daily -Monitor for s/sx of bleeding -F/U surgical plans   Georga Bora, PharmD Clinical Pharmacist 06/26/2017 7:18 AM

## 2017-06-26 NOTE — Progress Notes (Signed)
Consent signed and placed on chart

## 2017-06-26 NOTE — Progress Notes (Signed)
Refused bed alarm. Will continue to monitor patient. 

## 2017-06-26 NOTE — Progress Notes (Signed)
Patient ID: [name], female   DOB: Jun 17, 1945, 72 y.o.   MRN: [id number]    2 Days Post-Op  Subjective: Pt still has some slight abdominal discomfort in her RUQ.  Tolerating full liquids.    Objective: Vital signs in last 24 hours: Temp:  [98.2 F (36.8 C)-98.5 F (36.9 C)] 98.3 F (36.8 C) (04/10 0440) Pulse Rate:  [63-72] 71 (04/10 0440) Resp:  [18] 18 (04/10 0440) BP: (125-178)/(58-88) 169/88 (04/10 0440) SpO2:  [98 %-100 %] 98 % (04/10 0440) Weight:  [45.7 kg (100 lb 12.8 oz)] 45.7 kg (100 lb 12.8 oz) (04/10 0440) Last BM Date: 06/25/17  Intake/Output from previous day: 04/09 0701 - 04/10 0700 In: 2296.2 [P.O.:960; I.V.:1236.2; IV Piggyback:100] Out: 3300 [Urine:3300] Intake/Output this shift: No intake/output data recorded.  PE: Heart: regular Lungs: CTAB Abd: soft, minimally tender in the upper abdomen, +BS, ND  Lab Results:  Recent Labs    06/25/17 0228 06/26/17 0423  WBC 5.4 6.0  HGB 9.1* 8.8*  HCT 28.0* 28.3*  PLT 215 223   BMET Recent Labs    06/25/17 0228 06/26/17 0423  NA 138 141  K 3.4* 3.5  CL 101 101  CO2 27 30  GLUCOSE 106* 74  BUN 9 6  CREATININE 0.75 0.83  CALCIUM 8.2* 8.1*   PT/INR No results for input(s): LABPROT, INR in the last 72 hours. CMP     Component Value Date/Time   NA 141 06/26/2017 0423   K 3.5 06/26/2017 0423   CL 101 06/26/2017 0423   CO2 30 06/26/2017 0423   GLUCOSE 74 06/26/2017 0423   GLUCOSE 104 (H) 03/11/2006 1059   BUN 6 06/26/2017 0423   CREATININE 0.83 06/26/2017 0423   CREATININE 0.91 03/16/2016 1539   CALCIUM 8.1 (L) 06/26/2017 0423   PROT 5.6 (L) 06/26/2017 0423   ALBUMIN 2.6 (L) 06/26/2017 0423   AST 30 06/26/2017 0423   ALT 136 (H) 06/26/2017 0423   ALKPHOS 284 (H) 06/26/2017 0423   BILITOT 0.3 06/26/2017 0423   GFRNONAA >60 06/26/2017 0423   GFRAA >60 06/26/2017 0423   Lipase     Component Value Date/Time   LIPASE 8 (L) 12/11/2011 1455       Studies/Results: Dg Ercp Biliary  & Pancreatic Ducts  Result Date: 06/24/2017 CLINICAL DATA:  Common bile duct calculi EXAM: ERCP TECHNIQUE: Multiple spot images obtained with the fluoroscopic device and submitted for interpretation post-procedure. COMPARISON:  ERCP-06/23/2017 FLUOROSCOPY TIME:  1 minute FINDINGS: Three spot intraoperative fluoroscopic images of the right upper abdominal quadrant during ERCP are provided for review Images demonstrate removal of internal plastic biliary stent. There is opacification of the common bile duct which again appears mild to moderately dilated. Subsequent images demonstrate insufflation of a balloon within distal aspect of the CBD with subsequent presumed biliary sweeping and sphincterotomy. There is minimal opacification the central aspect of the biliary tree which appears nondilated. There is opacification of the cystic duct as well as the gallbladder. Several persistent filling defects are seen within the gallbladder suggestive of cholelithiasis. IMPRESSION: 1. ERCP with biliary stent removal and presumed biliary sweeping and sphincterotomy as detailed above. 2. Suspected cholelithiasis. These images were submitted for radiologic interpretation only. Please see the procedural report for the amount of contrast and the fluoroscopy time utilized. Electronically Signed   By: Sandi Mariscal M.D.   On: 06/24/2017 12:06    Anti-infectives: Anti-infectives (From admission, onward)   Start     Dose/Rate Route Frequency  Ordered Stop   06/24/17 1500  levofloxacin (LEVAQUIN) IVPB 500 mg     500 mg 100 mL/hr over 60 Minutes Intravenous Daily-1800 06/24/17 1327         Assessment/Plan Principal Problem: Weakness Active Problems: ANEMIA-IRON DEFICIENCY Essential hypertension Atrial fibrillation/flutter - on Eliquis LBBB - pacemaker COPD with emphysema (HCC) GERD (gastroesophageal reflux disease) Chronic diastolic CHF (congestive heart failure) (HCC) TIA (transient ischemic attack)  - 05/31/17 AKI (acute kidney injury) (HCC) Abnormal LFTs Hypotension HLD (hyperlipidemia) CAD (coronary artery disease) Protein-calorie malnutrition, severe (HCC) Extensive abdominal surgical history  Cholelithiasis/Choledocholithiasis -s/p ERCP with Stent placement and repeat ERCP with clearance of her duct and sphincterotomy -will plan to pursue open chole tomorrow.  -hold heparin gtt at 0200am 4/11 -cont levaquin for pre-op prophylaxis  DTO:IZTIW/PYK p MN VTE: SCD's,Heparin gtt (stop at 0200am 4/11) ID: WBCstable, afebrile, Levaquin  Foley:none Follow up:TBD  DISPO: Appreciate neuro evaluation.  Their recommendations have been reviewed with the patient.  And extensive discussion was had with the patient and her husband concerning surgical intervention.  We discussed risks and complications including, but not limited to Death, MI, CVA, VDRF, inadvertent bowel injury or injury to a biliary structure, bile leak, prolonged surgical time, prolonged surgical recovery, bleeding, infection.  The patient and her husband understand and wish to proceed.       LOS: 5 days    Henreitta Cea , [facility] Surgery 06/26/2017, 8:33 AM Pager: [phone]

## 2017-06-26 NOTE — Anesthesia Postprocedure Evaluation (Signed)
Anesthesia Post Note  Patient: ARCENIA SCARBRO  Procedure(s) Performed: ENDOSCOPIC RETROGRADE CHOLANGIOPANCREATOGRAPHY (ERCP) (N/A )     Patient location during evaluation: PACU Anesthesia Type: General Level of consciousness: awake and alert Pain management: pain level controlled Vital Signs Assessment: post-procedure vital signs reviewed and stable Respiratory status: spontaneous breathing, nonlabored ventilation, respiratory function stable and patient connected to nasal cannula oxygen Cardiovascular status: blood pressure returned to baseline and stable Postop Assessment: no apparent nausea or vomiting Anesthetic complications: no    Last Vitals:  Vitals:   06/25/17 1956 06/26/17 0440  BP: (!) 125/58 (!) 169/88  Pulse: 63 71  Resp: 18 18  Temp: 36.8 C 36.8 C  SpO2: 99% 98%    Last Pain:  Vitals:   06/26/17 0724  TempSrc:   PainSc: 8                  Javar Eshbach EDWARD

## 2017-06-26 NOTE — Progress Notes (Signed)
PROGRESS NOTE    [name]  DQQ:[id number] DOB: [birth year] DOA: 06/19/2017 PCP: Ann Held, DO    Brief Narrative:  [name] is a 72 y.o. femalewith medical history significant ofhypertension, hyperlipidemia, COPD, bronchiectasis, GERD, atrial fibrillation on Eliquis, pacemaker placement, left bundle blockage, gastric ulcer, GI bleeding, dCHF, anemia, psoriasis, abnormal liver function. She presented with lightheadedness found to have hypotension. Now with elevated LFTs secondary to choledocholithiasis.  Assessment & Plan:   Principal Problem:   Weakness Active Problems:   ANEMIA-IRON DEFICIENCY   Essential hypertension   Atrial fibrillation/flutter   COPD with emphysema (HCC)   GERD (gastroesophageal reflux disease)   Chronic diastolic CHF (congestive heart failure) (HCC)   TIA (transient ischemic attack)   AKI (acute kidney injury) (HCC)   Abnormal LFTs   Hypotension   HLD (hyperlipidemia)   CAD (coronary artery disease)   Protein-calorie malnutrition, severe (HCC)   Elevated ALT measurement  #1 acute cholecystitis/choledocholithiasis and ascending cholangitis Patient noted to have transaminitis during the hospitalization with elevated bilirubin.  Right upper quadrant ultrasound which was done was significant for cholelithiasis.  There is concern for distal common bile duct stone.  Patient seen in consultation by gastroenterology on 06/20/2017 who had recommended surgical input.  HIDA scan was done which was concerning for questionable pancreatic mass/tumor.  CT scan which was done showed abnormal appearing gallbladder with concern for acute cholecystitis.  Patient subsequently underwent a ERCP with sphincterectomy and biliary stent placement and stone extraction.  Patient needing cholecystectomy during his hospitalization and due to recent TIA neurology has been consulted for clearance.  Continue empiric IV Levaquin.  GI and general surgery following and  appreciate input and recommendations. Open chole tomorrow, hold heparin at 2 AM  2.  Hypotension Responded to IV fluids.  Resolved.  3.  Hypertension BP stable.  Continue Coreg, Lasix.   lisinopril to 20 mg daily for better blood pressure control.  Hydralazine as needed for systolic blood pressure greater than 180.  Follow.  4.  Esophageal reflux disease PPI.  5.  COPD stable Continue Spiriva.  Nebs as needed.  6.  Severe protein calorie malnutrition Continue nutritional supplementation.  7.  Atrial fibrillation Continue Coreg for rate control.  Eliquis on hold in anticipation of cholecystectomy.  Continue heparin for anticoagulation.  8.  Acute kidney injury Resolved with hydration.  9.  History of TIA Was on Eliquis and Lipitor as outpatient.  Anticipation for possible cholecystectomy and as such Eliquis on hold.  Continue IV heparin.  Follow.  10.  Hyperlipidemia Continue to hold statin.  11.  Status post left bundle branch block/chronic diastolic heart failure/artery artery disease Status post PPM.  Continue Coreg, Lasix, ACE inhibitor.  Stable.  12.  Chronic pain Continue home dose pain regimen.   DVT prophylaxis: Heparin Code Status: Full Family Communication: Updated patient.  No family at bedside. Disposition Plan: Pending open Cholecystectomy and per general surgery.   Consultants:   General surgery: Dr. Brantley Stage 06/20/2017  Gastroenterology: Dr. Lyndel Safe 06/21/2017  Neurology pending  Procedures:   ERCP 06/23/2017, 06/24/2017  CT abdomen and pelvis 06/21/2017  CT head 06/20/2017  Chest x-ray 06/20/2017  ERCP with placement of plastic biliary stent/choledocholithiasis 06/23/2017 per Dr. Lyndel Safe  ERCP with biliary stent removal and presumed biliary sweeping and sphincterotomy.  Suspected cholelithiasis.  Dr. Lyndel Safe 07/04/2017  HIDA scan 06/21/2017  Antimicrobials:   IV Levaquin 07/04/2017   Subjective: Denies any chest pain or shortness of breath. Mild abdominal  discomfort   Objective: Vitals:   06/25/17 1144 06/25/17 1956 06/26/17 0440 06/26/17 1248  BP: (!) 167/64 (!) 125/58 (!) 169/88 138/81  Pulse: 68 63 71 66  Resp: 18 18 18 20   Temp: 98.5 F (36.9 C) 98.2 F (36.8 C) 98.3 F (36.8 C) 98 F (36.7 C)  TempSrc: Oral Oral Oral Oral  SpO2: 100% 99% 98% 99%  Weight:   45.7 kg (100 lb 12.8 oz)   Height:        Intake/Output Summary (Last 24 hours) at 06/26/2017 1732 Last data filed at 06/26/2017 1306 Gross per 24 hour  Intake 2248.2 ml  Output 1700 ml  Net 548.2 ml   Filed Weights   06/24/17 1017 06/25/17 0449 06/26/17 0440  Weight: 47.3 kg (104 lb 3.2 oz) 46.2 kg (101 lb 12.8 oz) 45.7 kg (100 lb 12.8 oz)    Examination:  General exam: Appears calm and comfortable  Respiratory system: Clear to auscultation. Respiratory effort normal. Cardiovascular system: S1 & S2 heard, RRR. No JVD, murmurs, rubs, gallops or clicks. No pedal edema. Gastrointestinal system: Abdomen is nondistended, soft and mild tenderness to palpation in the right upper quadrant and epigastric region.  No organomegaly or masses felt. Normal bowel sounds heard. Central nervous system: Alert and oriented. No focal neurological deficits. Extremities: Symmetric 5 x 5 power. Skin: No rashes, lesions or ulcers Psychiatry: Judgement and insight appear normal. Mood & affect appropriate.     Data Reviewed: I have personally reviewed following labs and imaging studies  CBC: Recent Labs  Lab 06/19/17 2315  06/20/17 0351 06/21/17 0805 06/24/17 0752 06/25/17 0228 06/26/17 0423  WBC 11.2*  --  10.3 5.9 6.8 5.4 6.0  NEUTROABS 9.4*  --   --   --   --   --   --   HGB 10.1*   < > 9.0* 9.3* 8.9* 9.1* 8.8*  HCT 31.5*   < > 28.9* 29.6* 27.6* 28.0* 28.3*  MCV 91.0  --  90.6 89.4 88.7 88.1 89.0  PLT 272  --  211 217 197 215 223   < > = values in this interval not displayed.   Basic Metabolic Panel: Recent Labs  Lab 06/22/17 0915 06/23/17 1115 06/24/17 0752  06/25/17 0228 06/26/17 0423  NA 138 138 138 138 141  K 4.2 3.5 3.1* 3.4* 3.5  CL 106 104 104 101 101  CO2 22 22 25 27 30   GLUCOSE 89 128* 104* 106* 74  BUN 12 7 9 9 6   CREATININE 0.75 0.75 0.85 0.75 0.83  CALCIUM 8.6* 8.3* 7.9* 8.2* 8.1*  MG  --   --   --  1.5*  --    GFR: Estimated Creatinine Clearance: 44 mL/min (by C-G formula based on SCr of 0.83 mg/dL). Liver Function Tests: Recent Labs  Lab 06/22/17 0915 06/23/17 1115 06/24/17 0752 06/25/17 0228 06/26/17 0423  AST 586* 171* 63* 41 30  ALT 651* 434* 272* 207* 136*  ALKPHOS 557* 467* 364* 338* 284*  BILITOT 1.0 0.7 0.6 0.8 0.3  PROT 6.4* 6.4* 5.8* 5.9* 5.6*  ALBUMIN 2.8* 2.8* 2.5* 2.7* 2.6*   No results for input(s): LIPASE, AMYLASE in the last 168 hours. No results for input(s): AMMONIA in the last 168 hours. Coagulation Profile: Recent Labs  Lab 06/19/17 2315  INR 2.28   Cardiac Enzymes: No results for input(s): CKTOTAL, CKMB, CKMBINDEX, TROPONINI in the last 168 hours. BNP (last 3 results) No results for input(s): PROBNP in the last 8760  hours. HbA1C: No results for input(s): HGBA1C in the last 72 hours. CBG: No results for input(s): GLUCAP in the last 168 hours. Lipid Profile: No results for input(s): CHOL, HDL, LDLCALC, TRIG, CHOLHDL, LDLDIRECT in the last 72 hours. Thyroid Function Tests: No results for input(s): TSH, T4TOTAL, FREET4, T3FREE, THYROIDAB in the last 72 hours. Anemia Panel: No results for input(s): VITAMINB12, FOLATE, FERRITIN, TIBC, IRON, RETICCTPCT in the last 72 hours. Sepsis Labs: Recent Labs  Lab 06/20/17 0351  PROCALCITON 0.70    Recent Results (from the past 240 hour(s))  Culture, blood (Routine X 2) w Reflex to ID Panel     Status: None   Collection Time: 06/20/17  3:51 AM  Result Value Ref Range Status   Specimen Description BLOOD LEFT HAND  Final   Special Requests IN PEDIATRIC BOTTLE Blood Culture adequate volume  Final   Culture   Final    NO GROWTH 5  DAYS Performed at [facility], [place] [address], [place], [place] [address]    Report Status 06/25/2017 FINAL  Final  Culture, blood (Routine X 2) w Reflex to ID Panel     Status: None   Collection Time: 06/20/17  3:51 AM  Result Value Ref Range Status   Specimen Description BLOOD RIGHT HAND  Final   Special Requests   Final    BOTTLES DRAWN AEROBIC AND ANAEROBIC Blood Culture adequate volume   Culture   Final    NO GROWTH 5 DAYS Performed at [facility], [place] [address] [place], [place] [address]    Report Status 06/25/2017 FINAL  Final         Radiology Studies: No results found.      Scheduled Meds: . carvedilol  25 mg Oral BID  . furosemide  40 mg Oral q morning - 10a  . gabapentin  600 mg Oral TID  . lisinopril  20 mg Oral Daily  . pantoprazole  40 mg Oral BID  . tiotropium  1 capsule Inhalation Daily   Continuous Infusions: . sodium chloride 75 mL/hr at 06/25/17 2316  . heparin 900 Units/hr (06/26/17 1033)  . levofloxacin (LEVAQUIN) IV Stopped (06/25/17 2030)     LOS: 5 days    Time spent: 35 minutes    Berle Mull, MD [facility]  If 7PM-7AM, please contact night-coverage [email or web address] Password [facility] 06/26/2017, 5:32 PM

## 2017-06-27 ENCOUNTER — Inpatient Hospital Stay (HOSPITAL_COMMUNITY): Payer: PPO

## 2017-06-27 ENCOUNTER — Encounter (HOSPITAL_COMMUNITY)
Admission: EM | Disposition: A | Discharge status: Home / Self Care | Payer: Self-pay | Source: Home / Self Care | Attending: Internal Medicine

## 2017-06-27 HISTORY — PX: CHOLECYSTECTOMY: SHX55

## 2017-06-27 LAB — HEPARIN LEVEL (UNFRACTIONATED): Heparin Unfractionated: <0.1 IU/mL — ABNORMAL LOW (ref 0.30–0.70)

## 2017-06-27 LAB — COMPREHENSIVE METABOLIC PANEL
ALT: 100 U/L — ABNORMAL HIGH (ref 14–54)
AST: 21 U/L (ref 15–41)
Albumin: 2.6 g/dL — ABNORMAL LOW (ref 3.5–5.0)
Alkaline Phosphatase: 251 U/L — ABNORMAL HIGH (ref 38–126)
Anion gap: 9 (ref 5–15)
BUN: 5 mg/dL — ABNORMAL LOW (ref 6–20)
CO2: 28 mmol/L (ref 22–32)
Calcium: 8.1 mg/dL — ABNORMAL LOW (ref 8.9–10.3)
Chloride: 103 mmol/L (ref 101–111)
Creatinine, Ser: 0.77 mg/dL (ref 0.44–1.00)
GFR calc Af Amer: >60 mL/min (ref >60)
GFR calc non Af Amer: >60 mL/min (ref >60)
Glucose, Bld: 91 mg/dL (ref 65–99)
Potassium: 3.5 mmol/L (ref 3.5–5.1)
Sodium: 140 mmol/L (ref 135–145)
Total Bilirubin: 0.5 mg/dL (ref 0.3–1.2)
Total Protein: 5.7 g/dL — ABNORMAL LOW (ref 6.5–8.1)

## 2017-06-27 LAB — CBC
HCT: 26.9 % — ABNORMAL LOW (ref 36.0–46.0)
Hemoglobin: 8.6 g/dL — ABNORMAL LOW (ref 12.0–15.0)
MCH: 29.1 pg (ref 26.0–34.0)
MCHC: 32 g/dL (ref 30.0–36.0)
MCV: 90.9 fL (ref 78.0–100.0)
Platelets: 205 10*3/uL (ref 150–400)
RBC: 2.96 MIL/uL — ABNORMAL LOW (ref 3.87–5.11)
RDW: 14.7 % (ref 11.5–15.5)
WBC: 6 10*3/uL (ref 4.0–10.5)

## 2017-06-27 LAB — MAGNESIUM: Magnesium: 1.8 mg/dL (ref 1.7–2.4)

## 2017-06-27 LAB — SURGICAL PCR SCREEN
MRSA, PCR: NEGATIVE
Staphylococcus aureus: NEGATIVE

## 2017-06-27 SURGERY — CHOLECYSTECTOMY
Anesthesia: General | Site: Abdomen

## 2017-06-27 MED ORDER — MIDAZOLAM HCL 2 MG/2ML IJ SOLN
INTRAMUSCULAR | Status: AC
  Filled 2017-06-27: qty 2

## 2017-06-27 MED ORDER — ONDANSETRON HCL 4 MG/2ML IJ SOLN
INTRAMUSCULAR | Status: AC
  Filled 2017-06-27: qty 2

## 2017-06-27 MED ORDER — FENTANYL CITRATE (PF) 250 MCG/5ML IJ SOLN
INTRAMUSCULAR | Status: AC
  Filled 2017-06-27: qty 5

## 2017-06-27 MED ORDER — MORPHINE SULFATE (PF) 2 MG/ML IV SOLN
1.0000 mg | INTRAVENOUS | Status: DC | PRN
  Administered 2017-06-27: 3 mg via INTRAVENOUS
  Filled 2017-06-27: qty 2

## 2017-06-27 MED ORDER — OXYCODONE HCL 5 MG PO TABS: 5.0000 mg | ORAL_TABLET | Freq: Four times a day (QID) | ORAL | Status: DC | PRN

## 2017-06-27 MED ORDER — SUGAMMADEX SODIUM 200 MG/2ML IV SOLN
INTRAVENOUS | Status: AC
  Filled 2017-06-27: qty 2

## 2017-06-27 MED ORDER — HEPARIN (PORCINE) IN NACL 100-0.45 UNIT/ML-% IJ SOLN
900.0000 [IU]/h | INTRAMUSCULAR | Status: DC
  Filled 2017-06-27: qty 250

## 2017-06-27 MED ORDER — PROPOFOL 10 MG/ML IV BOLUS
INTRAVENOUS | Status: AC
  Filled 2017-06-27: qty 20

## 2017-06-27 MED ORDER — OXYCODONE HCL 5 MG PO TABS
5.0000 mg | ORAL_TABLET | ORAL | Status: DC | PRN
  Administered 2017-06-27: 10 mg via ORAL
  Administered 2017-06-27: 5 mg via ORAL
  Administered 2017-06-28 – 2017-07-02 (×17): 10 mg via ORAL
  Filled 2017-06-27 (×19): qty 2

## 2017-06-27 MED ORDER — FENTANYL CITRATE (PF) 100 MCG/2ML IJ SOLN
INTRAMUSCULAR | Status: AC
  Filled 2017-06-27: qty 2

## 2017-06-27 MED ORDER — MORPHINE SULFATE (PF) 2 MG/ML IV SOLN
2.0000 mg | INTRAVENOUS | Status: DC | PRN
  Administered 2017-06-27: 4 mg via INTRAVENOUS
  Administered 2017-06-27 (×2): 2 mg via INTRAVENOUS
  Administered 2017-06-28 (×2): 4 mg via INTRAVENOUS
  Administered 2017-06-28: 2 mg via INTRAVENOUS
  Filled 2017-06-27: qty 1
  Filled 2017-06-27: qty 2
  Filled 2017-06-27: qty 1
  Filled 2017-06-27 (×3): qty 2

## 2017-06-27 MED ORDER — HEPARIN (PORCINE) IN NACL 100-0.45 UNIT/ML-% IJ SOLN
900.0000 [IU]/h | INTRAMUSCULAR | Status: DC
  Administered 2017-06-27 – 2017-06-28 (×2): 900 [IU]/h via INTRAVENOUS
  Filled 2017-06-27: qty 2250
  Filled 2017-06-27: qty 250

## 2017-06-27 MED ORDER — HEMOSTATIC AGENTS (NO CHARGE) OPTIME
TOPICAL | Status: DC | PRN
  Administered 2017-06-27: 1 via TOPICAL

## 2017-06-27 MED ORDER — ONDANSETRON HCL 4 MG/2ML IJ SOLN
INTRAMUSCULAR | Status: DC | PRN
  Administered 2017-06-27: 4 mg via INTRAVENOUS

## 2017-06-27 MED ORDER — LIDOCAINE HCL (CARDIAC) 20 MG/ML IV SOLN
INTRAVENOUS | Status: DC | PRN
  Administered 2017-06-27: 60 mg via INTRAVENOUS

## 2017-06-27 MED ORDER — METOPROLOL TARTRATE 5 MG/5ML IV SOLN
INTRAVENOUS | Status: AC
  Filled 2017-06-27: qty 5

## 2017-06-27 MED ORDER — MORPHINE SULFATE (PF) 2 MG/ML IV SOLN
1.0000 mg | INTRAVENOUS | Status: DC | PRN
  Administered 2017-06-27: 2 mg via INTRAVENOUS
  Filled 2017-06-27: qty 1

## 2017-06-27 MED ORDER — HEPARIN (PORCINE) IN NACL 100-0.45 UNIT/ML-% IJ SOLN: 900.0000 [IU]/h | INTRAMUSCULAR | Status: DC

## 2017-06-27 MED ORDER — LABETALOL HCL 5 MG/ML IV SOLN
INTRAVENOUS | Status: DC | PRN
  Administered 2017-06-27: 5 mg via INTRAVENOUS

## 2017-06-27 MED ORDER — ACETAMINOPHEN 325 MG PO TABS
650.0000 mg | ORAL_TABLET | ORAL | Status: DC
  Administered 2017-06-27: 650 mg via ORAL
  Filled 2017-06-27: qty 2

## 2017-06-27 MED ORDER — 0.9 % SODIUM CHLORIDE (POUR BTL) OPTIME
TOPICAL | Status: DC | PRN
  Administered 2017-06-27 (×3): 1000 mL

## 2017-06-27 MED ORDER — ROCURONIUM BROMIDE 10 MG/ML (PF) SYRINGE
PREFILLED_SYRINGE | INTRAVENOUS | Status: AC
  Filled 2017-06-27: qty 5

## 2017-06-27 MED ORDER — LEVOFLOXACIN IN D5W 500 MG/100ML IV SOLN
500.0000 mg | INTRAVENOUS | Status: DC
  Filled 2017-06-27: qty 100

## 2017-06-27 MED ORDER — MIDAZOLAM HCL 5 MG/5ML IJ SOLN
INTRAMUSCULAR | Status: DC | PRN
  Administered 2017-06-27: 1 mg via INTRAVENOUS

## 2017-06-27 MED ORDER — METOPROLOL TARTRATE 5 MG/5ML IV SOLN
2.0000 mg | Freq: Once | INTRAVENOUS | Status: AC
  Administered 2017-06-27: 2 mg via INTRAVENOUS

## 2017-06-27 MED ORDER — GABAPENTIN 600 MG PO TABS: 300.0000 mg | ORAL_TABLET | Freq: Three times a day (TID) | ORAL | Status: DC

## 2017-06-27 MED ORDER — FENTANYL CITRATE (PF) 100 MCG/2ML IJ SOLN
INTRAMUSCULAR | Status: DC | PRN
  Administered 2017-06-27: 25 ug via INTRAVENOUS
  Administered 2017-06-27 (×5): 50 ug via INTRAVENOUS
  Administered 2017-06-27: 25 ug via INTRAVENOUS

## 2017-06-27 MED ORDER — LABETALOL HCL 5 MG/ML IV SOLN
INTRAVENOUS | Status: AC
  Filled 2017-06-27: qty 4

## 2017-06-27 MED ORDER — LIDOCAINE 2% (20 MG/ML) 5 ML SYRINGE
INTRAMUSCULAR | Status: AC
  Filled 2017-06-27: qty 5

## 2017-06-27 MED ORDER — PHENYLEPHRINE 40 MCG/ML (10ML) SYRINGE FOR IV PUSH (FOR BLOOD PRESSURE SUPPORT)
PREFILLED_SYRINGE | INTRAVENOUS | Status: AC
  Filled 2017-06-27: qty 10

## 2017-06-27 MED ORDER — EPHEDRINE SULFATE-NACL 50-0.9 MG/10ML-% IV SOSY
PREFILLED_SYRINGE | INTRAVENOUS | Status: DC | PRN
  Administered 2017-06-27: 10 mg via INTRAVENOUS

## 2017-06-27 MED ORDER — PROMETHAZINE HCL 25 MG/ML IJ SOLN: 6.2500 mg | INTRAMUSCULAR | Status: DC | PRN

## 2017-06-27 MED ORDER — HYDROMORPHONE HCL 1 MG/ML IJ SOLN
INTRAMUSCULAR | Status: AC
  Filled 2017-06-27: qty 1

## 2017-06-27 MED ORDER — SUGAMMADEX SODIUM 200 MG/2ML IV SOLN
INTRAVENOUS | Status: DC | PRN
  Administered 2017-06-27: 100 mg via INTRAVENOUS

## 2017-06-27 MED ORDER — DEXAMETHASONE SODIUM PHOSPHATE 10 MG/ML IJ SOLN
INTRAMUSCULAR | Status: DC | PRN
  Administered 2017-06-27: 10 mg via INTRAVENOUS

## 2017-06-27 MED ORDER — HYDROMORPHONE HCL 1 MG/ML IJ SOLN
0.2500 mg | INTRAMUSCULAR | Status: DC | PRN
  Administered 2017-06-27: 0.25 mg via INTRAVENOUS
  Administered 2017-06-27: 0.5 mg via INTRAVENOUS
  Administered 2017-06-27: 0.25 mg via INTRAVENOUS

## 2017-06-27 MED ORDER — ROCURONIUM BROMIDE 100 MG/10ML IV SOLN
INTRAVENOUS | Status: DC | PRN
  Administered 2017-06-27: 10 mg via INTRAVENOUS
  Administered 2017-06-27: 40 mg via INTRAVENOUS

## 2017-06-27 MED ORDER — PROPOFOL 10 MG/ML IV BOLUS
INTRAVENOUS | Status: DC | PRN
  Administered 2017-06-27: 80 mg via INTRAVENOUS

## 2017-06-27 MED ORDER — PROPOFOL 10 MG/ML IV BOLUS
INTRAVENOUS | Status: AC
Start: 2017-06-27 — End: 2017-06-27
  Filled 2017-06-27: qty 20

## 2017-06-27 MED ORDER — ACETAMINOPHEN 10 MG/ML IV SOLN
1000.0000 mg | Freq: Four times a day (QID) | INTRAVENOUS | Status: DC
  Administered 2017-06-27 – 2017-06-28 (×3): 1000 mg via INTRAVENOUS
  Filled 2017-06-27 (×4): qty 100

## 2017-06-27 MED ORDER — DEXAMETHASONE SODIUM PHOSPHATE 10 MG/ML IJ SOLN
INTRAMUSCULAR | Status: AC
  Filled 2017-06-27: qty 1

## 2017-06-27 MED ORDER — LACTATED RINGERS IV SOLN
INTRAVENOUS | Status: DC | PRN
  Administered 2017-06-27: 07:00:00 via INTRAVENOUS

## 2017-06-27 SURGICAL SUPPLY — 41 items
CANISTER SUCT 3000ML PPV (MISCELLANEOUS) ×2 IMPLANT
CHLORAPREP W/TINT 26ML (MISCELLANEOUS) ×2 IMPLANT
CLIP VESOCCLUDE MED 6/CT (CLIP) ×2 IMPLANT
COVER SURGICAL LIGHT HANDLE (MISCELLANEOUS) ×2 IMPLANT
DRAPE LAPAROSCOPIC ABDOMINAL (DRAPES) ×2 IMPLANT
DRAPE WARM FLUID 44X44 (DRAPE) ×2 IMPLANT
DRSG OPSITE POSTOP 4X8 (GAUZE/BANDAGES/DRESSINGS) ×2 IMPLANT
ELECT BLADE 6.5 EXT (BLADE) ×2 IMPLANT
ELECT CAUTERY BLADE 6.4 (BLADE) ×2 IMPLANT
ELECT REM PT RETURN 9FT ADLT (ELECTROSURGICAL) ×2
ELECTRODE REM PT RTRN 9FT ADLT (ELECTROSURGICAL) ×1 IMPLANT
GLOVE BIO SURGEON STRL SZ7.5 (GLOVE) ×2 IMPLANT
GLOVE BIOGEL PI IND STRL 6.5 (GLOVE) ×1 IMPLANT
GLOVE BIOGEL PI IND STRL 7.5 (GLOVE) ×1 IMPLANT
GLOVE BIOGEL PI IND STRL 8 (GLOVE) ×2 IMPLANT
GLOVE BIOGEL PI INDICATOR 6.5 (GLOVE) ×1
GLOVE BIOGEL PI INDICATOR 7.5 (GLOVE) ×1
GLOVE BIOGEL PI INDICATOR 8 (GLOVE) ×2
GLOVE SURG SS PI 6.0 STRL IVOR (GLOVE) ×2 IMPLANT
GLOVE SURG SS PI 6.5 STRL IVOR (GLOVE) ×2 IMPLANT
GLOVE SURG SS PI 7.0 STRL IVOR (GLOVE) ×2 IMPLANT
GOWN STRL REUS W/ TWL LRG LVL3 (GOWN DISPOSABLE) ×4 IMPLANT
GOWN STRL REUS W/ TWL XL LVL3 (GOWN DISPOSABLE) ×1 IMPLANT
GOWN STRL REUS W/TWL LRG LVL3 (GOWN DISPOSABLE) ×8
GOWN STRL REUS W/TWL XL LVL3 (GOWN DISPOSABLE) ×2
KIT BASIN OR (CUSTOM PROCEDURE TRAY) ×2 IMPLANT
KIT TURNOVER KIT B (KITS) ×2 IMPLANT
NS IRRIG 1000ML POUR BTL (IV SOLUTION) ×4 IMPLANT
PACK GENERAL/GYN (CUSTOM PROCEDURE TRAY) ×2 IMPLANT
PAD ARMBOARD 7.5X6 YLW CONV (MISCELLANEOUS) ×2 IMPLANT
STAPLER VISISTAT 35W (STAPLE) ×2 IMPLANT
SUCTION POOLE TIP (SUCTIONS) ×2 IMPLANT
SUT PDS AB 1 CT  36 (SUTURE) ×3
SUT PDS AB 1 CT 36 (SUTURE) ×3 IMPLANT
SUT SILK 2 0 TIES 10X30 (SUTURE) ×2 IMPLANT
SUT SILK 3 0 SH 30 (SUTURE) ×2 IMPLANT
SUT SILK 3 0 TIES 10X30 (SUTURE) ×2 IMPLANT
TOWEL OR 17X24 6PK STRL BLUE (TOWEL DISPOSABLE) ×2 IMPLANT
TOWEL OR 17X26 10 PK STRL BLUE (TOWEL DISPOSABLE) IMPLANT
TRAY FOLEY W/METER SILVER 16FR (SET/KITS/TRAYS/PACK) ×2 IMPLANT
YANKAUER SUCT BULB TIP NO VENT (SUCTIONS) IMPLANT

## 2017-06-27 NOTE — Progress Notes (Signed)
[facility] Progress Note  Patient: [name] YWV:[id number]   PCP: Ann Held, DO DOB: 1946/02/13   DOA: 06/19/2017   DOS: 06/27/2017   Date of Service: the patient was seen and examined on 06/27/2017  Subjective: Seen after the surgery, tolerated very well although has a lot of pain uncontrolled postoperatively.  No nausea no vomiting.  No bleeding noted from the surgical incision.  Brief hospital course: Becky H Hodgesis a 72 y.o.femalewith medical history significant ofhypertension, hyperlipidemia, COPD, bronchiectasis, GERD, atrial fibrillation on Eliquis, pacemaker placement, left bundle blockage, gastric ulcer, GI bleeding, dCHF, anemia, psoriasis, abnormal liver function. She presented with lightheadedness found to have hypotension. Now with elevated LFTssecondary to choledocholithiasis. Currently further plan is monitor postoperative recovery.  Assessment and Plan: #1 acute cholecystitis/choledocholithiasis and ascending cholangitis Elevated LFTs with bilirubin. RUQ [place] significant for cholelithiasis. HIDA scan was concerning for questionable pancreatic mass/tumor.  CT scan which was done showed abnormal appearing gallbladder with concern for acute cholecystitis. Patient subsequently underwent a ERCP with sphincterectomy and biliary stent placement and stone extraction.   due to recent TIA neurology has been consulted for clearance. Per surgery recommendation, started on empiric IV Levaquin. Tolerated open cholecystectomy today.  No active bleeding.  Pain uncontrolled, morphine dose increased.  Monitor.  2.  Hypotension Responded to IV fluids.  Resolved.  3.  Hypertension BP stable.  Continue Coreg, Lasix.   lisinopril to 20 mg daily for better blood pressure control.  Hydralazine as needed for systolic blood pressure greater than 180.  Follow.  4.  Esophageal reflux disease PPI.  5.  COPD stable Continue Spiriva.  Nebs as needed.  6.  Severe  protein calorie malnutrition Continue nutritional supplementation.  7.  Atrial fibrillation Continue Coreg for rate control.  Eliquis on hold in anticipation of cholecystectomy.  Continue heparin for anticoagulation.  8.  Acute kidney injury Resolved with hydration.  9.  History of TIA Was on Eliquis and Lipitor as outpatient.  Anticipation for possible cholecystectomy and as such Eliquis on hold.  Initial recommendation from the surgery was to hold heparin postoperatively for 24 hours, it appears that they recommended to resume it at 6 PM tonight.  Monitor.  10.  Hyperlipidemia Continue to elicit hold statin.  11.  Status post left bundle branch block/chronic diastolic heart failure/artery artery disease Status post PPM.  Continue Coreg, Lasix, ACE inhibitor.  Stable.  12.  Chronic pain Continue home dose pain regimen. Likely high tolerance of narcotic regimen, currently morphine dose increased may require Dilaudid.  Diet: Per surgery, currently on clear liquid diet DVT Prophylaxis: on therapeutic anticoagulation.  Advance goals of care discussion: Full code  Family Communication: family was present at bedside, at the time of interview. The pt provided permission to discuss medical plan with the family. Opportunity was given to ask question and all questions were answered satisfactorily.   Disposition:  Discharge to home.  Consultants: General surgery Gastroenterology  Neurology   Procedures:  ERCP 06/23/2017, 06/24/2017  CT abdomen and pelvis 06/21/2017  CT head 06/20/2017  Chest x-ray 06/20/2017  ERCP with placement of plastic biliary stent/choledocholithiasis 06/23/2017 per Dr. Lyndel Safe  ERCP with biliary stent removal and presumed biliary sweeping and sphincterotomy.  Suspected cholelithiasis.  Dr. Lyndel Safe 07/04/2017  HIDA scan 06/21/2017    Antibiotics: Anti-infectives (From admission, onward)   Start     Dose/Rate Route Frequency Ordered Stop   06/27/17 0715   levofloxacin (LEVAQUIN) IVPB 500 mg  Status:  Discontinued  500 mg 100 mL/hr over 60 Minutes Intravenous To Surgery 06/27/17 0709 06/27/17 1052   06/24/17 1500  levofloxacin (LEVAQUIN) IVPB 500 mg  Status:  Discontinued     500 mg 100 mL/hr over 60 Minutes Intravenous Daily-1800 06/24/17 1327 06/27/17 1111       Objective: Physical Exam: Vitals:   06/27/17 1202 06/27/17 1232 06/27/17 1303 06/27/17 1400  BP: (!) 171/77 (!) 173/69 (!) 178/69 (!) 159/81  Pulse: 60 63 70 61  Resp:    18  Temp:      TempSrc:      SpO2:  99% 98% 99%  Weight:      Height:        Intake/Output Summary (Last 24 hours) at 06/27/2017 1636 Last data filed at 06/27/2017 1040 Gross per 24 hour  Intake 1380 ml  Output 2900 ml  Net -1520 ml   Filed Weights   06/25/17 0449 06/26/17 0440 06/27/17 0528  Weight: 46.2 kg (101 lb 12.8 oz) 45.7 kg (100 lb 12.8 oz) 45.6 kg (100 lb 9.6 oz)   General: Alert, Awake and Oriented to Time, Place and Person. Appear in moderate distress, affect flat Eyes: PERRL, Conjunctiva normal ENT: Oral Mucosa clear moist. Neck: no JVD, no Abnormal Mass Or lumps Cardiovascular: S1 and S2 Present, no Murmur, Peripheral Pulses Present Respiratory: normal respiratory effort, Bilateral Air entry equal and Decreased, no use of accessory muscle, Clear to Auscultation, no Crackles, no wheezes Abdomen: Bowel Sound present, Soft and RUQ tenderness, no hernia Skin: no redness, no Rash, no induration Extremities: no Pedal edema, no calf tenderness Neurologic: Grossly no focal neuro deficit. Bilaterally Equal motor strength  Data Reviewed: CBC: Recent Labs  Lab 06/21/17 0805 06/24/17 0752 06/25/17 0228 06/26/17 0423 06/27/17 0456  WBC 5.9 6.8 5.4 6.0 6.0  HGB 9.3* 8.9* 9.1* 8.8* 8.6*  HCT 29.6* 27.6* 28.0* 28.3* 26.9*  MCV 89.4 88.7 88.1 89.0 90.9  PLT 217 197 215 223 474   Basic Metabolic Panel: Recent Labs  Lab 06/23/17 1115 06/24/17 0752 06/25/17 0228 06/26/17 0423  06/27/17 0456  NA 138 138 138 141 140  K 3.5 3.1* 3.4* 3.5 3.5  CL 104 104 101 101 103  CO2 22 25 27 30 28   GLUCOSE 128* 104* 106* 74 91  BUN 7 9 9 6  5*  CREATININE 0.75 0.85 0.75 0.83 0.77  CALCIUM 8.3* 7.9* 8.2* 8.1* 8.1*  MG  --   --  1.5*  --  1.8    Liver Function Tests: Recent Labs  Lab 06/23/17 1115 06/24/17 0752 06/25/17 0228 06/26/17 0423 06/27/17 0456  AST 171* 63* 41 30 21  ALT 434* 272* 207* 136* 100*  ALKPHOS 467* 364* 338* 284* 251*  BILITOT 0.7 0.6 0.8 0.3 0.5  PROT 6.4* 5.8* 5.9* 5.6* 5.7*  ALBUMIN 2.8* 2.5* 2.7* 2.6* 2.6*   No results for input(s): LIPASE, AMYLASE in the last 168 hours. No results for input(s): AMMONIA in the last 168 hours. Coagulation Profile: No results for input(s): INR, PROTIME in the last 168 hours. Cardiac Enzymes: No results for input(s): CKTOTAL, CKMB, CKMBINDEX, TROPONINI in the last 168 hours. BNP (last 3 results) No results for input(s): PROBNP in the last 8760 hours. CBG: No results for input(s): GLUCAP in the last 168 hours. Studies: No results found.  Scheduled Meds: . carvedilol  25 mg Oral BID  . furosemide  40 mg Oral q morning - 10a  . gabapentin  600 mg Oral TID  . HYDROmorphone      .  lisinopril  20 mg Oral Daily  . metoprolol tartrate      . pantoprazole  40 mg Oral BID  . tiotropium  1 capsule Inhalation Daily   Continuous Infusions: . sodium chloride 75 mL/hr at 06/27/17 1150  . acetaminophen    . heparin     PRN Meds: albuterol, hydrALAZINE, morphine injection, nitroGLYCERIN, ondansetron, oxyCODONE, senna-docusate, tiZANidine, zolpidem  Time spent: 35 minutes  Author: Berle Mull, MD [facility] Pager: [phone] 06/27/2017 4:36 PM  If 7PM-7AM, please contact night-coverage at [email or web address], password [facility]

## 2017-06-27 NOTE — Anesthesia Procedure Notes (Signed)
Procedure Name: Intubation Date/Time: 06/27/2017 7:39 AM Performed by: Renato Shin, CRNA Pre-anesthesia Checklist: Patient identified, Emergency Drugs available, Suction available, Timeout performed and Patient being monitored Patient Re-evaluated:Patient Re-evaluated prior to induction Oxygen Delivery Method: Circle system utilized Preoxygenation: Pre-oxygenation with 100% oxygen Induction Type: IV induction Ventilation: Mask ventilation without difficulty Laryngoscope Size: Mac and 3 Grade View: Grade I Tube type: Oral Tube size: 7.0 mm Number of attempts: 1 Airway Equipment and Method: Stylet Placement Confirmation: ETT inserted through vocal cords under direct vision,  positive ETCO2,  CO2 detector and breath sounds checked- equal and bilateral Secured at: 22 cm Tube secured with: Tape Dental Injury: Teeth and Oropharynx as per pre-operative assessment  Comments: Intubated without difficulties by Anette Hufham SRNA

## 2017-06-27 NOTE — Op Note (Signed)
06/27/2017  9:04 AM  PATIENT:  [name]  72 y.o. female  PRE-OPERATIVE DIAGNOSIS:  choledocholithiasis  POST-OPERATIVE DIAGNOSIS:  choledocholithiasis  PROCEDURE:  Procedure(s): OPEN CHOLECYSTECTOMY (N/A)  SURGEON:  Surgeon(s) and Role:    Ralene Ok, MD - Primary  PHYSICIAN ASSISTANT: Saverio Danker PA-C   ANESTHESIA:   general  EBL:  100 mL   BLOOD ADMINISTERED:none  DRAINS: none   LOCAL MEDICATIONS USED:  NONE  SPECIMEN:  Source of Specimen:  galbladder  DISPOSITION OF SPECIMEN:  PATHOLOGY  COUNTS:  YES  TOURNIQUET:  * No tourniquets in log *  DICTATION: .Dragon Dictation Patient is a 72 year old female with a history of choledocholithiasis.  Patient underwent ERCP and was found to have choledocholithiasis as well as purulence within the common bile duct.  After sphincterotomy and stent placement patient was taken to the operating room for open cholecystectomy secondary to previous multiple abdominal surgeries.  Findings: Patient had moderate amount of adhesions to the right upper quadrant.  The fundus of the gallbladder appeared to be scarred to the transverse colon.  This was sharply dissected away.  The cystic duct as well as common bile duct appear to be dilated.  The gallbladder bed was hemostatic.  Arista and Surgicel was placed along the liver bed.  Details of procedure: After the patient was consented she was taken back to the operating room placed in supine position with bilateral SCDs in place.  Patient prepped and draped in sterile fashion.  Foley catheter was placed.  After appropriate antibiotics confirm a timeout was called and all facts were verified.  A Coker incision was made in the right subcostal margin with a #10 blade.  Cautery was used to maintain hemostasis and dissection was taken down through the rectus muscles.  The peritoneum was entered sharply.  There was a moderate amount of adhesions just underneath the right subcostal  incision.  The liver was easily palpated and seen.  Following the edge of the liver the adhesions were taken down and the gallbladder was identified.  The gallbladder.  The adherent to both the omentum as well as the transverse colon.  This was sharply dissected away from the structures.  Once the fundus was able to be freed up the Bookwalter retractor was then placed to help with retraction.  This time the gallbladder was grasped.  The gallbladder was taken down in a dome down fashion and cautery was used to maintain hemostasis.  Dissection was taken down to close triangle.  The anterior and posterior cystic arteries were individually dissected away from surrounding tissue.  These were ligated with 2-0 silk proximally and one distally.  Each artery was individually transected.  At this time the neck of the gallbladder was circumferentially dissected away from surrounding tissue.  The cystic duct was easily seen.  This appeared to be dilated.  The common bile duct was also seen and appear to be dilated.  At this time a right angle was used to clamp the proximal cystic duct.  The cystic duct was transected.  206 were then used to doubly ligate the cystic duct.  A medium clip was also placed on the cystic duct.  The gallbladder was sent off to pathology.  At this time the area was irrigated out.  The liver bed with cauterized for hemostasis.  We placed Arista as well as Surgicel along the liver bed.  The area was hemostatic.  There was an area of the transverse colon that likely did  not have a serosal tear from the dissection of the fundus of the gallbladder attachments.  However I still placed a 2-0 silk Lembert stitch in this area.  At this time the Bookwalter retractor was removed.  #1 PDS hemostat was used to reapproximate the posterior muscular layer x1.  The anterior layer was reapproximated using single stranded #1 PDS x2 in a standard running fashion.  The skin was reapproximated using skin  staples.  The Foley catheter was removed & patient was extubated and the patient was taken back to recovery in stable condition.  PLAN OF CARE: Admit to inpatient   PATIENT DISPOSITION:  PACU - hemodynamically stable.   Delay start of Pharmacological VTE agent (>24hrs) due to surgical blood loss or risk of bleeding: yes

## 2017-06-27 NOTE — Progress Notes (Signed)
[place] for Heparin Indication: atrial fibrillation  Allergies  Allergen Reactions  . Dabigatran Etexilate Mesylate Other (See Comments)    INTERNAL BLEEDING-pradaxa  . Talwin [Pentazocine] Other (See Comments)    hallucinations  . Lactate Rash   Patient Measurements: Height: 5' (152.4 cm) Weight: 100 lb 9.6 oz (45.6 kg) IBW/kg (Calculated) : 45.5 Heparin Dosing Weight: 46kg  Vital Signs: Temp: 98.1 F (36.7 C) (04/11 1043) Temp Source: Oral (04/11 0528) BP: 159/94 (04/11 1038) Pulse Rate: 65 (04/11 1038)  Assessment: 72 yo female w/ recent TIA, readmitted for recurrent symptoms.  She is on apixaban PTA for afib, currently on hold. Patient s/p  ERCP 4/6, and now s/p cholecystectomy 4/11.  Heparin to be held 24 post procedure per surgery team. Order placed to resume tomorrow at 1000. Will follow-up AM labs for updated CBC.   Goal of Therapy:  Heparin level 0.3-0.7 units/ml aPTT 66-102 seconds Monitor platelets by anticoagulation protocol: Yes   Plan:  -Heparin gtt at 900 units/hr to resume 4/12 @ 1000 -Heparin level/ CBC daily -Monitor for s/sx of bleeding -F/U resuming oral AC   Georga Bora, PharmD Clinical Pharmacist 06/27/2017 12:45 PM

## 2017-06-27 NOTE — Progress Notes (Addendum)
Pt is alert and oriented with uncontrolled pain. Surgical wound is intact. No signs of bleeding. Hep drip was stopped to resume at 10am.  Will resume Heparin per orders form surgery PTT will be monitored.

## 2017-06-27 NOTE — Transfer of Care (Signed)
Immediate Anesthesia Transfer of Care Note  Patient: [name]  Procedure(s) Performed: OPEN CHOLECYSTECTOMY (N/A Abdomen)  Patient Location: PACU  Anesthesia Type:General  Level of Consciousness: awake, alert , oriented and patient cooperative  Airway & Oxygen Therapy: Patient Spontanous Breathing and Patient connected to nasal cannula oxygen  Post-op Assessment: Report given to RN and Post -op Vital signs reviewed and stable  Post vital signs: Reviewed and stable  Last Vitals:  Vitals Value Taken Time  BP 154/71 06/27/2017  9:23 AM  Temp    Pulse 66 06/27/2017  9:28 AM  Resp 12 06/27/2017  9:28 AM  SpO2 91 % 06/27/2017  9:28 AM  Vitals shown include unvalidated device data.  Last Pain:  Vitals:   06/27/17 0528  TempSrc: Oral  PainSc:       Patients Stated Pain Goal: 0 (55/01/58 6825)  Complications: No apparent anesthesia complications

## 2017-06-27 NOTE — Anesthesia Postprocedure Evaluation (Signed)
Anesthesia Post Note  Patient: [name]  Procedure(s) Performed: OPEN CHOLECYSTECTOMY (N/A Abdomen)     Patient location during evaluation: PACU Anesthesia Type: General Level of consciousness: awake and alert Pain management: pain level not controlled Vital Signs Assessment: post-procedure vital signs reviewed and stable Respiratory status: spontaneous breathing, nonlabored ventilation, respiratory function stable and patient connected to nasal cannula oxygen Cardiovascular status: blood pressure returned to baseline and stable Postop Assessment: no apparent nausea or vomiting Anesthetic complications: no    Last Vitals:  Vitals:   06/27/17 1022 06/27/17 1030  BP: (!) 178/69   Pulse: 66 65  Resp: 11   Temp:    SpO2: 96% 93%    Last Pain:  Vitals:   06/27/17 1026  TempSrc:   PainSc: 10-Worst pain ever                 Rainier Feuerborn S

## 2017-06-27 NOTE — Progress Notes (Addendum)
[place] for Heparin Indication: atrial fibrillation  Allergies  Allergen Reactions  . Dabigatran Etexilate Mesylate Other (See Comments)    INTERNAL BLEEDING-pradaxa  . Talwin [Pentazocine] Other (See Comments)    hallucinations  . Lactate Rash   Patient Measurements: Height: 5' (152.4 cm) Weight: 100 lb 9.6 oz (45.6 kg) IBW/kg (Calculated) : 45.5 Heparin Dosing Weight: 46kg  Vital Signs: Temp: 98.1 F (36.7 C) (04/11 1043) Temp Source: Oral (04/11 0528) BP: 159/81 (04/11 1400) Pulse Rate: 61 (04/11 1400)  Assessment: 72 yo female w/ recent TIA, readmitted for recurrent symptoms.  She is on apixaban PTA for afib, currently on hold. Patient s/p  ERCP 4/6, and now s/p cholecystectomy 4/11.  Heparin to be held 24 post procedure per surgery team. Order placed to resume tomorrow at 1000. Will follow-up AM labs for updated CBC.   Goal of Therapy:  Heparin level 0.3-0.7 units/ml aPTT 66-102 seconds Monitor platelets by anticoagulation protocol: Yes   Plan:  -Heparin gtt at 900 units/hr to resume 4/12 @ 1000 -Heparin level/ CBC daily -Monitor for s/sx of bleeding -F/U resuming oral AC   Georga Bora, PharmD Clinical Pharmacist 06/27/2017 4:34 PM   ADDENDUM:  Damaris Schooner with Surgery PA ([place]). She stated there was a miscommunication with [facility] earlier, but patient should actually be started back on heparin now and placed order. Last dose of apixaban 4/5. RN to be notified of change.  Plan: Heparin resumed this afternoon at previous rate 900 units/hr per Surgery request (no bolus) 8h heparin level from restart Monitor daily heparin level and CBC, s/sx bleeding  Elicia Lamp, PharmD, BCPS Clinical Pharmacist 06/27/2017 4:39 PM

## 2017-06-27 NOTE — Anesthesia Preprocedure Evaluation (Signed)
Anesthesia Evaluation  Patient identified by MRN, date of birth, ID band Patient awake    Reviewed: Allergy & Precautions, NPO status , Patient's Chart, lab work & pertinent test results  Airway Mallampati: II  TM Distance: >3 FB Neck ROM: Full    Dental no notable dental hx.    Pulmonary COPD,  oxygen dependent, former smoker,    breath sounds clear to auscultation + decreased breath sounds      Cardiovascular hypertension, + dysrhythmias Atrial Fibrillation + pacemaker + Valvular Problems/Murmurs MR  Rhythm:Regular Rate:Normal + Systolic murmurs Left ventricle: The cavity size was normal. Systolic function was   normal. The estimated ejection fraction was in the range of 55%   to 60%. Wall motion was normal; there were no regional wall   motion abnormalities. Doppler parameters are consistent with   abnormal left ventricular relaxation (grade 1 diastolic   dysfunction). - Mitral valve: Structurally normal valve. Chordal calcification.   There was moderate regurgitation. - Left atrium: The atrium was mildly dilated. - Pulmonary arteries: Systolic pressure was mildly increased. PA   peak pressure: 33 mm Hg (S).    Neuro/Psych TIAnegative psych ROS   GI/Hepatic negative GI ROS, Neg liver ROS,   Endo/Other  negative endocrine ROS  Renal/GU Renal InsufficiencyRenal disease  negative genitourinary   Musculoskeletal negative musculoskeletal ROS (+)   Abdominal   Peds negative pediatric ROS (+)  Hematology  (+) anemia ,   Anesthesia Other Findings   Reproductive/Obstetrics negative OB ROS                             Anesthesia Physical Anesthesia Plan  ASA: IV  Anesthesia Plan: General   Post-op Pain Management:    Induction: Intravenous  PONV Risk Score and Plan: 3 and Ondansetron, Dexamethasone and Treatment may vary due to age or medical condition  Airway Management Planned:  Oral ETT  Additional Equipment:   Intra-op Plan:   Post-operative Plan: Possible Post-op intubation/ventilation  Informed Consent: I have reviewed the patients History and Physical, chart, labs and discussed the procedure including the risks, benefits and alternatives for the proposed anesthesia with the patient or authorized representative who has indicated his/her understanding and acceptance.   Dental advisory given  Plan Discussed with: CRNA and Surgeon  Anesthesia Plan Comments:         Anesthesia Quick Evaluation

## 2017-06-27 NOTE — Plan of Care (Signed)
  Problem: Elimination: Goal: Will not experience complications related to bowel motility Outcome: Completed/Met

## 2017-06-27 NOTE — Progress Notes (Signed)
Report given to philip rn as caregiver

## 2017-06-28 ENCOUNTER — Encounter (HOSPITAL_COMMUNITY): Payer: Self-pay | Admitting: General Surgery

## 2017-06-28 LAB — COMPREHENSIVE METABOLIC PANEL
ALT: 96 U/L — ABNORMAL HIGH (ref 14–54)
AST: 50 U/L — ABNORMAL HIGH (ref 15–41)
Albumin: 2.8 g/dL — ABNORMAL LOW (ref 3.5–5.0)
Alkaline Phosphatase: 230 U/L — ABNORMAL HIGH (ref 38–126)
Anion gap: 10 (ref 5–15)
BUN: 6 mg/dL (ref 6–20)
CO2: 24 mmol/L (ref 22–32)
Calcium: 8.4 mg/dL — ABNORMAL LOW (ref 8.9–10.3)
Chloride: 102 mmol/L (ref 101–111)
Creatinine, Ser: 0.78 mg/dL (ref 0.44–1.00)
GFR calc Af Amer: >60 mL/min (ref >60)
GFR calc non Af Amer: >60 mL/min (ref >60)
Glucose, Bld: 104 mg/dL — ABNORMAL HIGH (ref 65–99)
Potassium: 4 mmol/L (ref 3.5–5.1)
Sodium: 136 mmol/L (ref 135–145)
Total Bilirubin: 0.6 mg/dL (ref 0.3–1.2)
Total Protein: 6 g/dL — ABNORMAL LOW (ref 6.5–8.1)

## 2017-06-28 LAB — CBC
HCT: 26.9 % — ABNORMAL LOW (ref 36.0–46.0)
Hemoglobin: 8.6 g/dL — ABNORMAL LOW (ref 12.0–15.0)
MCH: 28.9 pg (ref 26.0–34.0)
MCHC: 32 g/dL (ref 30.0–36.0)
MCV: 90.3 fL (ref 78.0–100.0)
Platelets: 256 10*3/uL (ref 150–400)
RBC: 2.98 MIL/uL — ABNORMAL LOW (ref 3.87–5.11)
RDW: 14.7 % (ref 11.5–15.5)
WBC: 11.9 10*3/uL — ABNORMAL HIGH (ref 4.0–10.5)

## 2017-06-28 LAB — HEPARIN LEVEL (UNFRACTIONATED): Heparin Unfractionated: 0.41 IU/mL (ref 0.30–0.70)

## 2017-06-28 MED ORDER — ACETAMINOPHEN 325 MG PO TABS
650.0000 mg | ORAL_TABLET | ORAL | Status: DC
  Administered 2017-06-29 – 2017-07-02 (×15): 650 mg via ORAL
  Filled 2017-06-28 (×17): qty 2

## 2017-06-28 MED ORDER — APIXABAN 5 MG PO TABS
5.0000 mg | ORAL_TABLET | Freq: Two times a day (BID) | ORAL | Status: DC
  Administered 2017-06-29: 5 mg via ORAL
  Filled 2017-06-28: qty 1

## 2017-06-28 MED ORDER — TIZANIDINE HCL 4 MG PO TABS
4.0000 mg | ORAL_TABLET | Freq: Three times a day (TID) | ORAL | Status: DC
  Administered 2017-06-28 – 2017-07-01 (×10): 4 mg via ORAL
  Filled 2017-06-28 (×10): qty 1

## 2017-06-28 MED ORDER — MORPHINE SULFATE (PF) 2 MG/ML IV SOLN
1.0000 mg | INTRAVENOUS | Status: DC | PRN
  Administered 2017-06-28 – 2017-06-29 (×4): 2 mg via INTRAVENOUS
  Filled 2017-06-28 (×4): qty 1

## 2017-06-28 NOTE — Progress Notes (Signed)
[place] for Heparin Indication: atrial fibrillation  Allergies  Allergen Reactions  . Dabigatran Etexilate Mesylate Other (See Comments)    INTERNAL BLEEDING-pradaxa  . Talwin [Pentazocine] Other (See Comments)    hallucinations  . Lactate Rash   Patient Measurements: Height: 5' (152.4 cm) Weight: 102 lb 9.6 oz (46.5 kg) IBW/kg (Calculated) : 45.5 Heparin Dosing Weight: 46kg  Vital Signs: Temp: 98 F (36.7 C) (04/12 0455) Temp Source: Oral (04/12 0455) BP: 159/70 (04/12 0455) Pulse Rate: 68 (04/12 0455)  Assessment: 72 yo female w/ recent TIA, readmitted for recurrent symptoms.  She is on apixaban PTA for afib, currently on hold. Patient s/p  ERCP 4/6, and now s/p cholecystectomy 4/11.  Heparin therapeutic 0.41 after being resumed yesterday evening post procedure per surgery team. HgB remains low but stable; will resume Eliquis tomorrow per surgery. Stop time to be updated for the heparin gtt.   Goal of Therapy:  Heparin level 0.3-0.7 units/ml aPTT 66-102 seconds Monitor platelets by anticoagulation protocol: Yes   Plan:  -Heparin gtt at 900 units/hr to be stopped 4/13 prior to Eliquis initiation -Heparin level/ CBC daily -Monitor for s/sx of bleeding -Resume Eliquis 4/13    Georga Bora, PharmD Clinical Pharmacist 06/28/2017 10:06 AM

## 2017-06-28 NOTE — Evaluation (Signed)
Physical Therapy Evaluation Patient Details Name: [name] MRN: [id number] DOB: Dec 03, 1945 Today's Date: 06/28/2017   History of Present Illness  Pt is a 72 y.o. female admitted 06/19/17 with c/o weakness and lightheadedness. CT negative for acute intracranial abnormality. Worked up for weakness likely due to hypotension, requiring fluids. CT scan showed abnormal appearing gallbladder with concern for acute cholecystitis. Now s/p ERCP with stent placement 4/11. PMH includes HTN, COPD, pacemaker, CHF, a-fib, anemia. Recent admission 3/14 with workup for TIA.    Clinical Impression  Pt seen for re-evaluation post-ERCP. Ambulating throughout room with HHA and performing ADL tasks with intermittent min guard for balance. Increased time and effort for mobility secondary to fatigue. Pt with urine incontinence, indep to don Depends underwear. Agreeable to HHPT services to maximize functional mobility and independence upon return home. Will have 24/7 support from family and owns necessary DME. Will follow acutely to progress ambulation.    Follow Up Recommendations Home health PT;Supervision for mobility/OOB    Equipment Recommendations  None recommended by PT    Recommendations for Other Services       Precautions / Restrictions Precautions Precautions: Fall Restrictions Weight Bearing Restrictions: No      Mobility  Bed Mobility Overal bed mobility: Modified Independent                Transfers Overall transfer level: Needs assistance Equipment used: 1 person hand held assist Transfers: Sit to/from Stand Sit to Stand: Min guard         General transfer comment: Stood from bed and BSC 3x for washup/gown change with intermittent HHA and min guard for balance  Ambulation/Gait Ambulation/Gait assistance: Min guard Ambulation Distance (Feet): 15 Feet Assistive device: 1 person hand held assist Gait Pattern/deviations: Step-through pattern;Decreased stride length Gait  velocity: Decreased   General Gait Details: Pt with urine incontinence in bed and while walking, amb to [facility] for washup requiring increased time and effort. Amb additionally to recliner with HHA and min guard for balance. Pt declining further distance secondary to fatigue  Stairs            Wheelchair Mobility    Modified Rankin (Stroke Patients Only)       Balance Overall balance assessment: Needs assistance   Sitting balance-Leahy Scale: Good Sitting balance - Comments: Indep to don socks sitting on BSC   Standing balance support: During functional activity;Single extremity supported;No upper extremity supported Standing balance-Leahy Scale: Fair Standing balance comment: Indep to don Depends underwear with intermittent UE support while standing and close min guard                             Pertinent Vitals/Pain Pain Assessment: Faces Faces Pain Scale: Hurts even more Pain Location: Abdomen Pain Descriptors / Indicators: Sore;Cramping Pain Intervention(s): Monitored during session;Patient requesting pain meds-RN notified    Home Living Family/patient expects to be discharged to:: Private residence Living Arrangements: Spouse/significant other;Children Available Help at Discharge: Family;Available 24 hours/day Type of Home: House Home Access: Stairs to enter   [employer] of Steps: threshold Home Layout: One level Home Equipment: [place] - 2 wheels;Cane - single point;Shower seat Additional Comments: Has two 45 year old children at home    Prior Function Level of Independence: Independent         Comments: does not typically use the cane or walker at baseline unless she is sick.      Hand Dominance   Dominant  Hand: Right    Extremity/Trunk Assessment   Upper Extremity Assessment Upper Extremity Assessment: Overall WFL for tasks assessed    Lower Extremity Assessment Lower Extremity Assessment: Overall WFL for tasks assessed     Cervical / Trunk Assessment Cervical / Trunk Assessment: Kyphotic  Communication   Communication: No difficulties  Cognition Arousal/Alertness: Awake/alert Behavior During Therapy: WFL for tasks assessed/performed Overall Cognitive Status: Within Functional Limits for tasks assessed                                        General Comments General comments (skin integrity, edema, etc.): SpO2 88% on RA; replaced O2 [place]    Exercises     Assessment/Plan    PT Assessment Patient needs continued PT services  PT Problem List Decreased activity tolerance;Decreased balance;Decreased mobility;Decreased knowledge of use of DME       PT Treatment Interventions DME instruction;Gait training;Functional mobility training;Stair training;Therapeutic activities;Therapeutic exercise;Balance training;Patient/family education    PT Goals (Current goals can be found in the Care Plan section)  Acute Rehab PT Goals Patient Stated Goal: to go home and feel back to normal PT Goal Formulation: With patient Time For Goal Achievement: 07/12/17 Potential to Achieve Goals: Good    Frequency Min 3X/week   Barriers to discharge        Co-evaluation               AM-PAC PT "6 Clicks" Daily Activity  Outcome Measure Difficulty turning over in bed (including adjusting bedclothes, sheets and blankets)?: None Difficulty moving from lying on back to sitting on the side of the bed? : None Difficulty sitting down on and standing up from a chair with arms (e.g., wheelchair, bedside commode, etc,.)?: A Little Help needed moving to and from a bed to chair (including a wheelchair)?: A Little Help needed walking in hospital room?: A Little Help needed climbing 3-5 steps with a railing? : A Little 6 Click Score: 20    End of Session Equipment Utilized During Treatment: Gait belt;Oxygen(3L O2 baseline) Activity Tolerance: Patient tolerated treatment well Patient left: in chair;with call  bell/phone within reach;with nursing/sitter in room Nurse Communication: Mobility status PT Visit Diagnosis: Difficulty in walking, not elsewhere classified (R26.2);Other abnormalities of gait and mobility (R26.89)    Time: 2725-3664 PT Time Calculation (min) (ACUTE ONLY): 24 min   Charges:   PT Evaluation $PT Re-evaluation: 1 Re-eval PT Treatments $Therapeutic Activity: 8-22 mins   PT G Codes:       Mabeline Caras, PT, DPT Acute Rehab Services  Pager: [phone] 06/28/2017, 2:59 PM

## 2017-06-28 NOTE — Care Management Important Message (Signed)
Important Message  Patient Details  Name: [name] MRN: [id number] Date of Birth: October 04, 1945   Medicare Important Message Given:  Yes    Barb Merino Manasa Spease 06/28/2017, 2:03 PM

## 2017-06-28 NOTE — Progress Notes (Signed)
Patient ID: NATHALI VENT, female   DOB: March 14, 1946, 72 y.o.   MRN: [id number]    1 Day Post-Op  Subjective: Patient has some pain today, but mostly soreness with movement.  Tolerating clear liquids.  Objective: Vital signs in last 24 hours: Temp:  [98 F (36.7 C)-98.2 F (36.8 C)] 98 F (36.7 C) (04/12 0455) Pulse Rate:  [60-77] 68 (04/12 0455) Resp:  [11-20] 16 (04/12 0455) BP: (108-186)/(66-94) 159/70 (04/12 0455) SpO2:  [88 %-100 %] 100 % (04/12 0455) Weight:  [46.5 kg (102 lb 9.6 oz)] 46.5 kg (102 lb 9.6 oz) (04/12 0600) Last BM Date: 06/26/17  Intake/Output from previous day: 04/11 0701 - 04/12 0700 In: 2644.1 [P.O.:240; I.V.:2104.1; IV Piggyback:300] Out: 2750 [Urine:2650; Blood:100] Intake/Output this shift: No intake/output data recorded.  PE: Heart: regular Lungs: CTAB Abd: soft, appropriately tender, incision is c/d/i with staples and honeycomb dressing present. +BS  Lab Results:  Recent Labs    06/27/17 0456 06/28/17 0618  WBC 6.0 11.9*  HGB 8.6* 8.6*  HCT 26.9* 26.9*  PLT 205 256   BMET Recent Labs    06/27/17 0456 06/28/17 0618  NA 140 136  K 3.5 4.0  CL 103 102  CO2 28 24  GLUCOSE 91 104*  BUN 5* 6  CREATININE 0.77 0.78  CALCIUM 8.1* 8.4*   PT/INR No results for input(s): LABPROT, INR in the last 72 hours. CMP     Component Value Date/Time   NA 136 06/28/2017 0618   K 4.0 06/28/2017 0618   CL 102 06/28/2017 0618   CO2 24 06/28/2017 0618   GLUCOSE 104 (H) 06/28/2017 0618   GLUCOSE 104 (H) 03/11/2006 1059   BUN 6 06/28/2017 0618   CREATININE 0.78 06/28/2017 0618   CREATININE 0.91 03/16/2016 1539   CALCIUM 8.4 (L) 06/28/2017 0618   PROT 6.0 (L) 06/28/2017 0618   ALBUMIN 2.8 (L) 06/28/2017 0618   AST 50 (H) 06/28/2017 0618   ALT 96 (H) 06/28/2017 0618   ALKPHOS 230 (H) 06/28/2017 0618   BILITOT 0.6 06/28/2017 0618   GFRNONAA >60 06/28/2017 0618   GFRAA >60 06/28/2017 0618   Lipase     Component Value Date/Time   LIPASE 8  (L) 12/11/2011 1455       Studies/Results: No results found.  Anti-infectives: Anti-infectives (From admission, onward)   Start     Dose/Rate Route Frequency Ordered Stop   06/27/17 0715  levofloxacin (LEVAQUIN) IVPB 500 mg  Status:  Discontinued     500 mg 100 mL/hr over 60 Minutes Intravenous To Surgery 06/27/17 0709 06/27/17 1052   06/24/17 1500  levofloxacin (LEVAQUIN) IVPB 500 mg  Status:  Discontinued     500 mg 100 mL/hr over 60 Minutes Intravenous Daily-1800 06/24/17 1327 06/27/17 1111       Assessment/Plan Principal Problem: Weakness Active Problems: ANEMIA-IRON DEFICIENCY Essential hypertension Atrial fibrillation/flutter - on Eliquis LBBB - pacemaker COPD with emphysema (HCC) GERD (gastroesophageal reflux disease) Chronic diastolic CHF (congestive heart failure) (HCC) TIA (transient ischemic attack) - 05/31/17 AKI (acute kidney injury) ([place]) Abnormal LFTs Hypotension HLD (hyperlipidemia) CAD (coronary artery disease) Protein-calorie malnutrition, severe (HCC) Extensive abdominal surgical history  Cholelithiasis/Choledocholithiasis, POD 1, s/p open cholecystectomy -s/p ERCP with Stent placementand repeat ERCP with clearance of her duct and sphincterotomy -patient doing well.  Pain control will be an issue given her dependence on narcotics at home. Wean morphine today.  Scheduled tylenol, Neurontin, oxy q 4 hrs. -mobilize TID in halls -IS   HYQ:MVHQION diet  VTE: SCD's,Heparin gtt, can resume eliquis Saturday 4/13 ID: Levaquin DC after OR yesterday Foley:none Follow up:Dr. Ramirez/[facility]  DISPO: Advance diet today Wean narcotics IS mobilize   LOS: 7 days    Henreitta Cea , [facility] Surgery 06/28/2017, 9:11 AM Pager: [phone]

## 2017-06-28 NOTE — Progress Notes (Signed)
[facility] Progress Note  Patient: [name] JME:[id number]   PCP: [name] DOB: [birth year]   DOA: 06/19/2017   DOS: 06/28/2017   Date of Service: the patient was seen and examined on 06/28/2017  Subjective: Feeling better, pain is well controlled.  No nausea no vomiting.  No bleeding noted.  Brief hospital course: Becky H Hodgesis a 72 y.o.femalewith medical history significant ofhypertension, hyperlipidemia, COPD, bronchiectasis, GERD, atrial fibrillation on Eliquis, pacemaker placement, left bundle blockage, gastric ulcer, GI bleeding, dCHF, anemia, psoriasis, abnormal liver function. She presented with lightheadedness found to have hypotension. Now with elevated LFTssecondary to choledocholithiasis. Currently further plan is monitor postoperative recovery.  Assessment and Plan: #1 acute cholecystitis/choledocholithiasis and ascending cholangitis Elevated LFTs with bilirubin. RUQ [place] significant for cholelithiasis. HIDA scan was concerning for questionable pancreatic mass/tumor.  CT scan which was done showed abnormal appearing gallbladder with concern for acute cholecystitis. Patient subsequently underwent a ERCP with sphincterectomy and biliary stent placement and stone extraction.   due to recent TIA neurology has been consulted for clearance. Per surgery recommendation, started on empiric IV Levaquin. Tolerated open cholecystectomy.  No active bleeding.  Pain uncontrolled, morphine dose increased.  Monitor.  2.  Hypotension Responded to IV fluids.  Resolved.  3.  Hypertension BP stable.  Continue Coreg, Lasix.   lisinopril to 20 mg daily for better blood pressure control.  Hydralazine as needed for systolic blood pressure greater than 180.  Follow.  4.  Esophageal reflux disease PPI.  5.  COPD stable Continue Spiriva.  Nebs as needed.  6.  Severe protein calorie malnutrition Continue nutritional supplementation.  7.  Atrial  fibrillation Continue Coreg for rate control.  Eliquis on hold in anticipation of cholecystectomy.  Continue heparin for anticoagulation.  Should be able to resume Eliquis tomorrow.  8.  Acute kidney injury Resolved with hydration.  9.  History of TIA Was on Eliquis and Lipitor as outpatient.  Anticipation for possible cholecystectomy and as such Eliquis on hold.  Resuming tomorrow.  10.  Hyperlipidemia Continue to elicit hold statin.  11.  Status post left bundle branch block/chronic diastolic heart failure/artery artery disease Status post PPM.  Continue Coreg, Lasix, ACE inhibitor.  Stable.  12.  Chronic pain Continue home dose pain regimen.  Diet: Per surgery, currently on full liquid diet DVT Prophylaxis: on therapeutic anticoagulation.  Advance goals of care discussion: Full code  Family Communication: family was present at bedside, at the time of interview. The pt provided permission to discuss medical plan with the family. Opportunity was given to ask question and all questions were answered satisfactorily.   Disposition:  Discharge to home.  Consultants: General surgery Gastroenterology  Neurology   Procedures:  ERCP 06/23/2017, 06/24/2017  CT abdomen and pelvis 06/21/2017  CT head 06/20/2017  Chest x-ray 06/20/2017  ERCP with placement of plastic biliary stent/choledocholithiasis 06/23/2017 per Dr. Lyndel Ross  ERCP with biliary stent removal and presumed biliary sweeping and sphincterotomy.  Suspected cholelithiasis.  Dr. Lyndel Ross 07/04/2017  HIDA scan 06/21/2017    Antibiotics: Anti-infectives (From admission, onward)   Start     Dose/Rate Route Frequency Ordered Stop   06/27/17 0715  levofloxacin (LEVAQUIN) IVPB 500 mg  Status:  Discontinued     500 mg 100 mL/hr over 60 Minutes Intravenous To Surgery 06/27/17 0709 06/27/17 1052   06/24/17 1500  levofloxacin (LEVAQUIN) IVPB 500 mg  Status:  Discontinued     500 mg 100 mL/hr over 60 Minutes Intravenous Daily-1800  06/24/17 1327 06/27/17 1111       Objective: Physical Exam: Vitals:   06/28/17 0031 06/28/17 0455 06/28/17 0600 06/28/17 1017  BP: (!) 155/74 (!) 159/70  (!) 121/47  Pulse: 69 68  77  Resp: 16 16  18   Temp: 98.1 F (36.7 C) 98 F (36.7 C)  97.9 F (36.6 C)  TempSrc: Oral Oral  Oral  SpO2: 99% 100%  100%  Weight:   46.5 kg (102 lb 9.6 oz)   Height:        Intake/Output Summary (Last 24 hours) at 06/28/2017 1817 Last data filed at 06/28/2017 1817 Gross per 24 hour  Intake 2067.05 ml  Output 3350 ml  Net -1282.95 ml   Filed Weights   06/26/17 0440 06/27/17 0528 06/28/17 0600  Weight: 45.7 kg (100 lb 12.8 oz) 45.6 kg (100 lb 9.6 oz) 46.5 kg (102 lb 9.6 oz)   General: Alert, Awake and Oriented to Time, Place and Person. Appear in moderate distress, affect flat Eyes: PERRL, Conjunctiva normal ENT: Oral Mucosa clear moist. Neck: no JVD, no Abnormal Mass Or lumps Cardiovascular: S1 and S2 Present, no Murmur, Peripheral Pulses Present Respiratory: normal respiratory effort, Bilateral Air entry equal and Decreased, no use of accessory muscle, Clear to Auscultation, no Crackles, no wheezes Abdomen: Bowel Sound present, Soft and RUQ tenderness, no hernia Skin: no redness, no Rash, no induration Extremities: no Pedal edema, no calf tenderness Neurologic: Grossly no focal neuro deficit. Bilaterally Equal motor strength  Data Reviewed: CBC: Recent Labs  Lab 06/24/17 0752 06/25/17 0228 06/26/17 0423 06/27/17 0456 06/28/17 0618  WBC 6.8 5.4 6.0 6.0 11.9*  HGB 8.9* 9.1* 8.8* 8.6* 8.6*  HCT 27.6* 28.0* 28.3* 26.9* 26.9*  MCV 88.7 88.1 89.0 90.9 90.3  PLT 197 215 223 205 974   Basic Metabolic Panel: Recent Labs  Lab 06/24/17 0752 06/25/17 0228 06/26/17 0423 06/27/17 0456 06/28/17 0618  NA 138 138 141 140 136  K 3.1* 3.4* 3.5 3.5 4.0  CL 104 101 101 103 102  CO2 25 27 30 28 24   GLUCOSE 104* 106* 74 91 104*  BUN 9 9 6  5* 6  CREATININE 0.85 0.75 0.83 0.77 0.78  CALCIUM  7.9* 8.2* 8.1* 8.1* 8.4*  MG  --  1.5*  --  1.8  --     Liver Function Tests: Recent Labs  Lab 06/24/17 0752 06/25/17 0228 06/26/17 0423 06/27/17 0456 06/28/17 0618  AST 63* 41 30 21 50*  ALT 272* 207* 136* 100* 96*  ALKPHOS 364* 338* 284* 251* 230*  BILITOT 0.6 0.8 0.3 0.5 0.6  PROT 5.8* 5.9* 5.6* 5.7* 6.0*  ALBUMIN 2.5* 2.7* 2.6* 2.6* 2.8*   No results for input(s): LIPASE, AMYLASE in the last 168 hours. No results for input(s): AMMONIA in the last 168 hours. Coagulation Profile: No results for input(s): INR, PROTIME in the last 168 hours. Cardiac Enzymes: No results for input(s): CKTOTAL, CKMB, CKMBINDEX, TROPONINI in the last 168 hours. BNP (last 3 results) No results for input(s): PROBNP in the last 8760 hours. CBG: No results for input(s): GLUCAP in the last 168 hours. Studies: No results found.  Scheduled Meds: . [START ON 06/29/2017] acetaminophen  650 mg Oral Q4H  . [START ON 06/29/2017] apixaban  5 mg Oral BID  . carvedilol  25 mg Oral BID  . furosemide  40 mg Oral q morning - 10a  . gabapentin  600 mg Oral TID  . lisinopril  20 mg Oral Daily  .  pantoprazole  40 mg Oral BID  . tiotropium  1 capsule Inhalation Daily  . tiZANidine  4 mg Oral TID   Continuous Infusions: . heparin 900 Units/hr (06/28/17 1734)   PRN Meds: albuterol, hydrALAZINE, morphine injection, nitroGLYCERIN, ondansetron, oxyCODONE, senna-docusate, zolpidem  Time spent: 35 minutes  Author: Berle Mull, MD [facility] Pager: [phone] 06/28/2017 6:17 PM  If 7PM-7AM, please contact night-coverage at [email or web address], password [facility]

## 2017-06-29 LAB — CBC
HCT: 23.1 % — ABNORMAL LOW (ref 36.0–46.0)
HCT: 23.1 % — ABNORMAL LOW (ref 36.0–46.0)
Hemoglobin: 7.4 g/dL — ABNORMAL LOW (ref 12.0–15.0)
Hemoglobin: 7.4 g/dL — ABNORMAL LOW (ref 12.0–15.0)
MCH: 28.7 pg (ref 26.0–34.0)
MCH: 28.7 pg (ref 26.0–34.0)
MCHC: 32 g/dL (ref 30.0–36.0)
MCHC: 32 g/dL (ref 30.0–36.0)
MCV: 89.5 fL (ref 78.0–100.0)
MCV: 89.5 fL (ref 78.0–100.0)
Platelets: 181 10*3/uL (ref 150–400)
Platelets: 194 10*3/uL (ref 150–400)
RBC: 2.58 MIL/uL — ABNORMAL LOW (ref 3.87–5.11)
RBC: 2.58 MIL/uL — ABNORMAL LOW (ref 3.87–5.11)
RDW: 14.6 % (ref 11.5–15.5)
RDW: 14.6 % (ref 11.5–15.5)
WBC: 7 10*3/uL (ref 4.0–10.5)
WBC: 6.7 10*3/uL (ref 4.0–10.5)

## 2017-06-29 LAB — COMPREHENSIVE METABOLIC PANEL
ALT: 64 U/L — ABNORMAL HIGH (ref 14–54)
AST: 27 U/L (ref 15–41)
Albumin: 2.4 g/dL — ABNORMAL LOW (ref 3.5–5.0)
Alkaline Phosphatase: 179 U/L — ABNORMAL HIGH (ref 38–126)
Anion gap: 6 (ref 5–15)
BUN: 6 mg/dL (ref 6–20)
CO2: 31 mmol/L (ref 22–32)
Calcium: 8 mg/dL — ABNORMAL LOW (ref 8.9–10.3)
Chloride: 100 mmol/L — ABNORMAL LOW (ref 101–111)
Creatinine, Ser: 0.9 mg/dL (ref 0.44–1.00)
GFR calc Af Amer: >60 mL/min (ref >60)
GFR calc non Af Amer: >60 mL/min (ref >60)
Glucose, Bld: 102 mg/dL — ABNORMAL HIGH (ref 65–99)
Potassium: 3.4 mmol/L — ABNORMAL LOW (ref 3.5–5.1)
Sodium: 137 mmol/L (ref 135–145)
Total Bilirubin: 0.4 mg/dL (ref 0.3–1.2)
Total Protein: 5.4 g/dL — ABNORMAL LOW (ref 6.5–8.1)

## 2017-06-29 LAB — RETICULOCYTES
RBC.: 2.58 MIL/uL — ABNORMAL LOW (ref 3.87–5.11)
Retic Count, Absolute: 43.9 10*3/uL (ref 19.0–186.0)
Retic Ct Pct: 1.7 % (ref 0.4–3.1)

## 2017-06-29 LAB — MAGNESIUM: Magnesium: 1.6 mg/dL — ABNORMAL LOW (ref 1.7–2.4)

## 2017-06-29 LAB — TYPE AND SCREEN
ABO/RH(D): A NEG
Antibody Screen: NEGATIVE

## 2017-06-29 LAB — HEPARIN LEVEL (UNFRACTIONATED): Heparin Unfractionated: 0.18 IU/mL — ABNORMAL LOW (ref 0.30–0.70)

## 2017-06-29 MED ORDER — APIXABAN 5 MG PO TABS
5.0000 mg | ORAL_TABLET | Freq: Two times a day (BID) | ORAL | Status: DC
  Administered 2017-06-29 – 2017-07-02 (×6): 5 mg via ORAL
  Filled 2017-06-29 (×6): qty 1

## 2017-06-29 MED ORDER — GABAPENTIN 400 MG PO CAPS
800.0000 mg | ORAL_CAPSULE | Freq: Three times a day (TID) | ORAL | Status: DC
  Administered 2017-06-29 – 2017-06-30 (×4): 800 mg via ORAL
  Filled 2017-06-29 (×4): qty 2

## 2017-06-29 MED ORDER — GABAPENTIN 800 MG PO TABS
800.0000 mg | ORAL_TABLET | Freq: Three times a day (TID) | ORAL | Status: DC
  Filled 2017-06-29: qty 1

## 2017-06-29 MED ORDER — MAGNESIUM SULFATE 2 GM/50ML IV SOLN
2.0000 g | Freq: Once | INTRAVENOUS | Status: AC
  Administered 2017-06-29: 2 g via INTRAVENOUS
  Filled 2017-06-29: qty 50

## 2017-06-29 MED ORDER — SENNOSIDES-DOCUSATE SODIUM 8.6-50 MG PO TABS
1.0000 | ORAL_TABLET | Freq: Two times a day (BID) | ORAL | Status: DC
  Administered 2017-06-30 – 2017-07-01 (×4): 1 via ORAL
  Filled 2017-06-29 (×6): qty 1

## 2017-06-29 MED ORDER — POLYETHYLENE GLYCOL 3350 17 G PO PACK
17.0000 g | PACK | Freq: Every day | ORAL | Status: DC
  Administered 2017-07-01: 17 g via ORAL
  Filled 2017-06-29 (×3): qty 1

## 2017-06-29 NOTE — Progress Notes (Signed)
[facility] Progress Note  Patient: [name]:[id number]   PCP: Ann Held, DO DOB: 1945/10/25   DOA: 06/19/2017   DOS: 06/29/2017   Date of Service: the patient was seen and examined on 06/29/2017  Subjective: Complains about uncontrolled burning pain in her abdomen.  No nausea no vomiting.  Passing gas as well as having bowel movement.  No fever no chills.  Brief hospital course: Becky H Hodgesis a 72 y.o.femalewith medical history significant ofhypertension, hyperlipidemia, COPD, bronchiectasis, GERD, atrial fibrillation on Eliquis, pacemaker placement, left bundle blockage, gastric ulcer, GI bleeding, dCHF, anemia, psoriasis, abnormal liver function. She presented with lightheadedness found to have hypotension. Now with elevated LFTssecondary to choledocholithiasis. Currently further plan is monitor postoperative recovery.  Assessment and Plan: #1 acute cholecystitis/choledocholithiasis and ascending cholangitis Elevated LFTs with bilirubin. RUQ [place] significant for cholelithiasis. HIDA scan was concerning for questionable pancreatic mass/tumor.  CT scan which was done showed abnormal appearing gallbladder with concern for acute cholecystitis. Patient subsequently underwent a ERCP with sphincterectomy and biliary stent placement and stone extraction.   due to recent TIA neurology has been consulted for clearance. Per surgery recommendation, started on empiric IV Levaquin. Tolerated open cholecystectomy.  No active bleeding.  Pain uncontrolled, discontinue IV morphine, continue oral Percocet.  Patient placed on scheduled Zanaflex.  Also increasing gabapentin.  2.    Postoperative acute blood loss anemia. Hemoglobin 7.4 this morning after the procedure. Most likely this is acute blood loss with some worsening due to dilutional aspect. Repeat H&H is about the same. Initially plan was to hold Eliquis given that the repeat H&H is stable will resume Eliquis and  monitor CBC every 12 hours. Transfuse for hemoglobin less than 7  3.  Hypertension BP stable.  Continue Coreg, Lasix.   lisinopril to 20 mg daily for better blood pressure control.  Hydralazine as needed for systolic blood pressure greater than 180.  Follow.  4.  Esophageal reflux disease PPI.  5.  COPD stable Continue Spiriva.  Nebs as needed.  6.  Severe protein calorie malnutrition Continue nutritional supplementation.  7.  Atrial fibrillation Continue Coreg for rate control.   Resume Eliquis for now.  8.  Acute kidney injury Resolved with hydration.  9.  History of TIA Was on Eliquis and Lipitor as outpatient.  Anticipation for possible cholecystectomy and as such Eliquis on hold.  Resuming tomorrow.  10.  Hyperlipidemia Continue to elicit hold statin.  11.  Status post left bundle branch block/chronic diastolic heart failure/artery artery disease Status post PPM.  Continue Coreg, Lasix, ACE inhibitor.  Stable.  12.  Chronic pain Continue home dose pain regimen.  Diet: Per surgery, on regular diet DVT Prophylaxis: on therapeutic anticoagulation.  Advance goals of care discussion: Full code  Family Communication: family was present at bedside, at the time of interview. The pt provided permission to discuss medical plan with the family. Opportunity was given to ask question and all questions were answered satisfactorily.   Disposition:  Discharge to home.  Consultants: General surgery Gastroenterology  Neurology   Procedures:  ERCP 06/23/2017, 06/24/2017  CT abdomen and pelvis 06/21/2017  CT head 06/20/2017  Chest x-ray 06/20/2017  ERCP with placement of plastic biliary stent/choledocholithiasis 06/23/2017 per Dr. Lyndel Safe  ERCP with biliary stent removal and presumed biliary sweeping and sphincterotomy.  Suspected cholelithiasis.  Dr. Lyndel Safe 07/04/2017  HIDA scan 06/21/2017    Antibiotics: Anti-infectives (From admission, onward)   Start     Dose/Rate  Route Frequency  Ordered Stop   06/27/17 0715  levofloxacin (LEVAQUIN) IVPB 500 mg  Status:  Discontinued     500 mg 100 mL/hr over 60 Minutes Intravenous To Surgery 06/27/17 0709 06/27/17 1052   06/24/17 1500  levofloxacin (LEVAQUIN) IVPB 500 mg  Status:  Discontinued     500 mg 100 mL/hr over 60 Minutes Intravenous Daily-1800 06/24/17 1327 06/27/17 1111       Objective: Physical Exam: Vitals:   06/29/17 0215 06/29/17 0446 06/29/17 0500 06/29/17 1210  BP: 136/60 (!) 145/61  (!) 107/53  Pulse: 72 78  67  Resp:  18  18  Temp:  98.6 F (37 C)    TempSrc:  Oral    SpO2:  100%  100%  Weight:   46.1 kg (101 lb 9.6 oz)   Height:        Intake/Output Summary (Last 24 hours) at 06/29/2017 1547 Last data filed at 06/29/2017 1500 Gross per 24 hour  Intake 824.45 ml  Output 2400 ml  Net -1575.55 ml   Filed Weights   06/27/17 0528 06/28/17 0600 06/29/17 0500  Weight: 45.6 kg (100 lb 9.6 oz) 46.5 kg (102 lb 9.6 oz) 46.1 kg (101 lb 9.6 oz)   General: Alert, Awake and Oriented to Time, Place and Person. Appear in moderate distress, affect flat Eyes: PERRL, Conjunctiva normal ENT: Oral Mucosa clear moist. Neck: no JVD, no Abnormal Mass Or lumps Cardiovascular: S1 and S2 Present, no Murmur, Peripheral Pulses Present Respiratory: normal respiratory effort, Bilateral Air entry equal and Decreased, no use of accessory muscle, Clear to Auscultation, no Crackles, no wheezes Abdomen: Bowel Sound present, Soft and RUQ tenderness, no hernia Skin: no redness, no Rash, no induration Extremities: no Pedal edema, no calf tenderness Neurologic: Grossly no focal neuro deficit. Bilaterally Equal motor strength  Data Reviewed: CBC: Recent Labs  Lab 06/26/17 0423 06/27/17 0456 06/28/17 0618 06/29/17 0725 06/29/17 1128  WBC 6.0 6.0 11.9* 7.0 6.7  HGB 8.8* 8.6* 8.6* 7.4* 7.4*  HCT 28.3* 26.9* 26.9* 23.1* 23.1*  MCV 89.0 90.9 90.3 89.5 89.5  PLT 223 205 256 181 956   Basic Metabolic  Panel: Recent Labs  Lab 06/25/17 0228 06/26/17 0423 06/27/17 0456 06/28/17 0618 06/29/17 0725  NA 138 141 140 136 137  K 3.4* 3.5 3.5 4.0 3.4*  CL 101 101 103 102 100*  CO2 27 30 28 24 31   GLUCOSE 106* 74 91 104* 102*  BUN 9 6 5* 6 6  CREATININE 0.75 0.83 0.77 0.78 0.90  CALCIUM 8.2* 8.1* 8.1* 8.4* 8.0*  MG 1.5*  --  1.8  --  1.6*    Liver Function Tests: Recent Labs  Lab 06/25/17 0228 06/26/17 0423 06/27/17 0456 06/28/17 0618 06/29/17 0725  AST 41 30 21 50* 27  ALT 207* 136* 100* 96* 64*  ALKPHOS 338* 284* 251* 230* 179*  BILITOT 0.8 0.3 0.5 0.6 0.4  PROT 5.9* 5.6* 5.7* 6.0* 5.4*  ALBUMIN 2.7* 2.6* 2.6* 2.8* 2.4*   No results for input(s): LIPASE, AMYLASE in the last 168 hours. No results for input(s): AMMONIA in the last 168 hours. Coagulation Profile: No results for input(s): INR, PROTIME in the last 168 hours. Cardiac Enzymes: No results for input(s): CKTOTAL, CKMB, CKMBINDEX, TROPONINI in the last 168 hours. BNP (last 3 results) No results for input(s): PROBNP in the last 8760 hours. CBG: No results for input(s): GLUCAP in the last 168 hours. Studies: No results found.  Scheduled Meds: . acetaminophen  650 mg Oral  Q4H  . carvedilol  25 mg Oral BID  . furosemide  40 mg Oral q morning - 10a  . gabapentin  800 mg Oral TID  . lisinopril  20 mg Oral Daily  . pantoprazole  40 mg Oral BID  . polyethylene glycol  17 g Oral Daily  . senna-docusate  1 tablet Oral BID  . tiotropium  1 capsule Inhalation Daily  . tiZANidine  4 mg Oral TID   Continuous Infusions:  PRN Meds: albuterol, hydrALAZINE, nitroGLYCERIN, ondansetron, oxyCODONE, zolpidem  Time spent: 35 minutes  Author: Berle Mull, MD [facility] Pager: [phone] 06/29/2017 3:47 PM  If 7PM-7AM, please contact night-coverage at [email or web address], password [facility]

## 2017-06-29 NOTE — Progress Notes (Signed)
2 Days Post-Op   Subjective/Chief Complaint: Very sore pain an issue for her but she is getting better    Objective: Vital signs in last 24 hours: Temp:  [97.9 F (36.6 C)-98.6 F (37 C)] 98.6 F (37 C) (04/13 0446) Pulse Rate:  [72-78] 78 (04/13 0446) Resp:  [18] 18 (04/13 0446) BP: (112-145)/(47-67) 145/61 (04/13 0446) SpO2:  [100 %] 100 % (04/13 0446) Weight:  [46.1 kg (101 lb 9.6 oz)] 46.1 kg (101 lb 9.6 oz) (04/13 0500) Last BM Date: 06/28/17  Intake/Output from previous day: 04/12 0701 - 04/13 0700 In: 1385.5 [P.O.:1160; I.V.:225.5] Out: 2600 [Urine:2600] Intake/Output this shift: No intake/output data recorded.  Incision/Wound:CDI dressing in place very sore to palpation   Lab Results:  Recent Labs    06/28/17 0618 06/29/17 0725  WBC 11.9* 7.0  HGB 8.6* 7.4*  HCT 26.9* 23.1*  PLT 256 181   BMET Recent Labs    06/28/17 0618 06/29/17 0725  NA 136 137  K 4.0 3.4*  CL 102 100*  CO2 24 31  GLUCOSE 104* 102*  BUN 6 6  CREATININE 0.78 0.90  CALCIUM 8.4* 8.0*   PT/INR No results for input(s): LABPROT, INR in the last 72 hours. ABG No results for input(s): PHART, HCO3 in the last 72 hours.  Invalid input(s): PCO2, PO2  Studies/Results: No results found.  Anti-infectives: Anti-infectives (From admission, onward)   Start     Dose/Rate Route Frequency Ordered Stop   06/27/17 0715  levofloxacin (LEVAQUIN) IVPB 500 mg  Status:  Discontinued     500 mg 100 mL/hr over 60 Minutes Intravenous To Surgery 06/27/17 0709 06/27/17 1052   06/24/17 1500  levofloxacin (LEVAQUIN) IVPB 500 mg  Status:  Discontinued     500 mg 100 mL/hr over 60 Minutes Intravenous Daily-1800 06/24/17 1327 06/27/17 1111      Assessment/Plan: s/p Procedure(s): OPEN CHOLECYSTECTOMY (N/Becky)  Principal Problem: Weakness Active Problems: ANEMIA-IRON DEFICIENCY Essential hypertension Atrial fibrillation/flutter - on Eliquis LBBB - pacemaker COPD with emphysema  (HCC) GERD (gastroesophageal reflux disease) Chronic diastolic CHF (congestive heart failure) (HCC) TIA (transient ischemic attack) - 05/31/17 AKI (acute kidney injury) (HCC) Abnormal LFTs Hypotension HLD (hyperlipidemia) CAD (coronary artery disease) Protein-calorie malnutrition, severe (HCC) Extensive abdominal surgical history  Cholelithiasis/Choledocholithiasis, POD 1, s/p open cholecystectomy -s/p ERCP with Stent placementand repeat ERCP with clearance of her duct and sphincterotomy -patient doing well.  Pain control will be an issue given her dependence on narcotics at home. Wean morphine today.  Scheduled tylenol, Neurontin, oxy q 4 hrs. -mobilize TID in halls -IS   OJJ:[id number] diet VTE: SCD's,Heparin gtt, can resume eliquis Saturday 4/13 ID: Levaquin DC after OR yesterday Foley:none Follow up:Dr. Ramirez/[facility]  DISPO: Advance diet today Wean narcotics IS mobilize    LOS: 8 days    [name] [name] 06/29/2017

## 2017-06-30 LAB — BASIC METABOLIC PANEL
Anion gap: 9 (ref 5–15)
BUN: 9 mg/dL (ref 6–20)
CO2: 29 mmol/L (ref 22–32)
Calcium: 8 mg/dL — ABNORMAL LOW (ref 8.9–10.3)
Chloride: 99 mmol/L — ABNORMAL LOW (ref 101–111)
Creatinine, Ser: 0.85 mg/dL (ref 0.44–1.00)
GFR calc Af Amer: >60 mL/min (ref >60)
GFR calc non Af Amer: >60 mL/min (ref >60)
Glucose, Bld: 114 mg/dL — ABNORMAL HIGH (ref 65–99)
Potassium: 3.6 mmol/L (ref 3.5–5.1)
Sodium: 137 mmol/L (ref 135–145)

## 2017-06-30 LAB — CBC
HCT: 24.6 % — ABNORMAL LOW (ref 36.0–46.0)
Hemoglobin: 7.8 g/dL — ABNORMAL LOW (ref 12.0–15.0)
MCH: 28.3 pg (ref 26.0–34.0)
MCHC: 31.7 g/dL (ref 30.0–36.0)
MCV: 89.1 fL (ref 78.0–100.0)
Platelets: 195 10*3/uL (ref 150–400)
RBC: 2.76 MIL/uL — ABNORMAL LOW (ref 3.87–5.11)
RDW: 14.7 % (ref 11.5–15.5)
WBC: 6.5 10*3/uL (ref 4.0–10.5)

## 2017-06-30 MED ORDER — GABAPENTIN 600 MG PO TABS
900.0000 mg | ORAL_TABLET | Freq: Three times a day (TID) | ORAL | Status: DC
  Administered 2017-06-30 – 2017-07-01 (×2): 900 mg via ORAL
  Filled 2017-06-30 (×2): qty 2

## 2017-06-30 NOTE — Progress Notes (Signed)
Pt is little nauseous this evening, IV zofran given, still not recovered, some gingarale provided, pain medicine is given round the clock for her surgical incision pain, Tele DC this evening, denies chest pain and SOB, double checked with MD, will continue to monitor the patient.  Palma Holter, RN

## 2017-06-30 NOTE — Progress Notes (Signed)
[facility] Progress Note  Patient: [name] EQA:[id number]   PCP: Ann Held, DO DOB: 14-May-1945   DOA: 06/19/2017   DOS: 06/30/2017   Date of Service: the patient was seen and examined on 06/30/2017  Subjective: Continues to require higher doses of pain medication although patient's home regimen is about the same as her current regimen here in the hospital.  No nausea no vomiting no diarrhea.  Brief hospital course: Becky H Hodgesis a 72 y.o.femalewith medical history significant ofhypertension, hyperlipidemia, COPD, bronchiectasis, GERD, atrial fibrillation on Eliquis, pacemaker placement, left bundle blockage, gastric ulcer, GI bleeding, dCHF, anemia, psoriasis, abnormal liver function. She presented with lightheadedness found to have hypotension. Now with elevated LFTssecondary to choledocholithiasis. Currently further plan is monitor postoperative recovery.  Assessment and Plan: #1 acute cholecystitis/choledocholithiasis and ascending cholangitis Elevated LFTs with bilirubin. RUQ [place] significant for cholelithiasis. HIDA scan was concerning for questionable pancreatic mass/tumor.  CT scan which was done showed abnormal appearing gallbladder with concern for acute cholecystitis. Patient subsequently underwent a ERCP with sphincterectomy and biliary stent placement and stone extraction.   due to recent TIA neurology has been consulted for clearance. Per surgery recommendation, started on empiric IV Levaquin. Tolerated open cholecystectomy.  No active bleeding.  Pain uncontrolled, discontinue IV morphine, continue oral Percocet.  Patient placed on scheduled Zanaflex.  Also increasing gabapentin.  2.    Postoperative acute blood loss anemia. Hemoglobin lower but stable.  Will monitor. Most likely this is acute blood loss with some worsening due to dilutional aspect. Repeat H&H is about the same. To new Eliquis. Transfuse for hemoglobin less than 7  3.   Hypertension BP stable.  Continue Coreg, Lasix.   lisinopril to 20 mg daily for better blood pressure control.  Hydralazine as needed for systolic blood pressure greater than 180.  Follow.  4.  Esophageal reflux disease PPI.  5.  COPD stable Continue Spiriva.  Nebs as needed.  6.  Severe protein calorie malnutrition Continue nutritional supplementation.  7.  Atrial fibrillation Continue Coreg for rate control.   Resume Eliquis for now.  8.  Acute kidney injury Resolved with hydration.  9.  History of TIA Was on Eliquis and Lipitor as outpatient.  Anticipation for possible cholecystectomy and as such Eliquis on hold.  Resuming tomorrow.  10.  Hyperlipidemia Continue to elicit hold statin.  11.  Status post left bundle branch block/chronic diastolic heart failure/artery artery disease Status post PPM.  Continue Coreg, Lasix, ACE inhibitor.  Stable.  12.  Chronic pain Continue home dose pain regimen.  Diet: Per surgery, on regular diet DVT Prophylaxis: on therapeutic anticoagulation.  Advance goals of care discussion: Full code  Family Communication: no family was present at bedside, at the time of interview.  Disposition:  Discharge to home.  Consultants: General surgery Gastroenterology  Neurology   Procedures:  ERCP 06/23/2017, 06/24/2017  CT abdomen and pelvis 06/21/2017  CT head 06/20/2017  Chest x-ray 06/20/2017  ERCP with placement of plastic biliary stent/choledocholithiasis 06/23/2017 per Dr. Lyndel Safe  ERCP with biliary stent removal and presumed biliary sweeping and sphincterotomy.  Suspected cholelithiasis.  Dr. Lyndel Safe 07/04/2017  HIDA scan 06/21/2017    Antibiotics: Anti-infectives (From admission, onward)   Start     Dose/Rate Route Frequency Ordered Stop   06/27/17 0715  levofloxacin (LEVAQUIN) IVPB 500 mg  Status:  Discontinued     500 mg 100 mL/hr over 60 Minutes Intravenous To Surgery 06/27/17 0709 06/27/17 1052   06/24/17 1500  levofloxacin  (LEVAQUIN) IVPB 500 mg  Status:  Discontinued     500 mg 100 mL/hr over 60 Minutes Intravenous Daily-1800 06/24/17 1327 06/27/17 1111       Objective: Physical Exam: Vitals:   06/29/17 1210 06/29/17 2006 06/30/17 0433 06/30/17 1220  BP: (!) 107/53 (!) 94/51 (!) 119/54 114/83  Pulse: 67 67 64 60  Resp: 18 18 16 18   Temp:  98.2 F (36.8 C) 98 F (36.7 C)   TempSrc:  Oral Oral   SpO2: 100% 100% 100% 99%  Weight:   45.9 kg (101 lb 4.8 oz)   Height:        Intake/Output Summary (Last 24 hours) at 06/30/2017 1731 Last data filed at 06/30/2017 1400 Gross per 24 hour  Intake 422 ml  Output 1900 ml  Net -1478 ml   Filed Weights   06/28/17 0600 06/29/17 0500 06/30/17 0433  Weight: 46.5 kg (102 lb 9.6 oz) 46.1 kg (101 lb 9.6 oz) 45.9 kg (101 lb 4.8 oz)   General: Alert, Awake and Oriented to Time, Place and Person. Appear in moderate distress, affect flat Eyes: PERRL, Conjunctiva normal ENT: Oral Mucosa clear moist. Neck: no JVD, no Abnormal Mass Or lumps Cardiovascular: S1 and S2 Present, no Murmur, Peripheral Pulses Present Respiratory: normal respiratory effort, Bilateral Air entry equal and Decreased, no use of accessory muscle, Clear to Auscultation, no Crackles, no wheezes Abdomen: Bowel Sound present, Soft and RUQ tenderness, no hernia Skin: no redness, no Rash, no induration Extremities: no Pedal edema, no calf tenderness Neurologic: Grossly no focal neuro deficit. Bilaterally Equal motor strength  Data Reviewed: CBC: Recent Labs  Lab 06/27/17 0456 06/28/17 0618 06/29/17 0725 06/29/17 1128 06/30/17 0409  WBC 6.0 11.9* 7.0 6.7 6.5  HGB 8.6* 8.6* 7.4* 7.4* 7.8*  HCT 26.9* 26.9* 23.1* 23.1* 24.6*  MCV 90.9 90.3 89.5 89.5 89.1  PLT 205 256 181 194 419   Basic Metabolic Panel: Recent Labs  Lab 06/25/17 0228 06/26/17 0423 06/27/17 0456 06/28/17 0618 06/29/17 0725 06/30/17 0409  NA 138 141 140 136 137 137  K 3.4* 3.5 3.5 4.0 3.4* 3.6  CL 101 101 103 102 100*  99*  CO2 27 30 28 24 31 29   GLUCOSE 106* 74 91 104* 102* 114*  BUN 9 6 5* 6 6 9   CREATININE 0.75 0.83 0.77 0.78 0.90 0.85  CALCIUM 8.2* 8.1* 8.1* 8.4* 8.0* 8.0*  MG 1.5*  --  1.8  --  1.6*  --     Liver Function Tests: Recent Labs  Lab 06/25/17 0228 06/26/17 0423 06/27/17 0456 06/28/17 0618 06/29/17 0725  AST 41 30 21 50* 27  ALT 207* 136* 100* 96* 64*  ALKPHOS 338* 284* 251* 230* 179*  BILITOT 0.8 0.3 0.5 0.6 0.4  PROT 5.9* 5.6* 5.7* 6.0* 5.4*  ALBUMIN 2.7* 2.6* 2.6* 2.8* 2.4*   No results for input(s): LIPASE, AMYLASE in the last 168 hours. No results for input(s): AMMONIA in the last 168 hours. Coagulation Profile: No results for input(s): INR, PROTIME in the last 168 hours. Cardiac Enzymes: No results for input(s): CKTOTAL, CKMB, CKMBINDEX, TROPONINI in the last 168 hours. BNP (last 3 results) No results for input(s): PROBNP in the last 8760 hours. CBG: No results for input(s): GLUCAP in the last 168 hours. Studies: No results found.  Scheduled Meds: . acetaminophen  650 mg Oral Q4H  . apixaban  5 mg Oral BID  . carvedilol  25 mg Oral BID  . furosemide  40 mg Oral q morning - 10a  . gabapentin  900 mg Oral TID  . lisinopril  20 mg Oral Daily  . pantoprazole  40 mg Oral BID  . polyethylene glycol  17 g Oral Daily  . senna-docusate  1 tablet Oral BID  . tiotropium  1 capsule Inhalation Daily  . tiZANidine  4 mg Oral TID   Continuous Infusions:  PRN Meds: albuterol, hydrALAZINE, nitroGLYCERIN, ondansetron, oxyCODONE, zolpidem  Time spent: 35 minutes  Author: Berle Mull, MD [facility] Pager: [phone] 06/30/2017 5:31 PM  If 7PM-7AM, please contact night-coverage at [email or web address], password [facility]

## 2017-06-30 NOTE — Progress Notes (Addendum)
3 Days Post-Op   Subjective/Chief Complaint: Pain controlled - denies complaints aside incisional pain. Listening to her brother's church sermon on her phone during rounds.  Objective: Vital signs in last 24 hours: Temp:  [98 F (36.7 C)-98.2 F (36.8 C)] 98 F (36.7 C) (04/14 0433) Pulse Rate:  [64-67] 64 (04/14 0433) Resp:  [16-18] 16 (04/14 0433) BP: (94-119)/(51-54) 119/54 (04/14 0433) SpO2:  [100 %] 100 % (04/14 0433) Weight:  [45.9 kg (101 lb 4.8 oz)] 45.9 kg (101 lb 4.8 oz) (04/14 0433) Last BM Date: 06/28/17  Intake/Output from previous day: 04/13 0701 - 04/14 0700 In: 3710 [P.O.:1142] Out: 3200 [Urine:3200] Intake/Output this shift: No intake/output data recorded.  Incision/Wound:CDI dressing in place  Lab Results:  Recent Labs    06/29/17 1128 06/30/17 0409  WBC 6.7 6.5  HGB 7.4* 7.8*  HCT 23.1* 24.6*  PLT 194 195   BMET Recent Labs    06/29/17 0725 06/30/17 0409  NA 137 137  K 3.4* 3.6  CL 100* 99*  CO2 31 29  GLUCOSE 102* 114*  BUN 6 9  CREATININE 0.90 0.85  CALCIUM 8.0* 8.0*   PT/INR No results for input(s): LABPROT, INR in the last 72 hours. ABG No results for input(s): PHART, HCO3 in the last 72 hours.  Invalid input(s): PCO2, PO2  Studies/Results: No results found.  Anti-infectives: Anti-infectives (From admission, onward)   Start     Dose/Rate Route Frequency Ordered Stop   06/27/17 0715  levofloxacin (LEVAQUIN) IVPB 500 mg  Status:  Discontinued     500 mg 100 mL/hr over 60 Minutes Intravenous To Surgery 06/27/17 0709 06/27/17 1052   06/24/17 1500  levofloxacin (LEVAQUIN) IVPB 500 mg  Status:  Discontinued     500 mg 100 mL/hr over 60 Minutes Intravenous Daily-1800 06/24/17 1327 06/27/17 1111      Assessment/Plan: s/p Procedure(s): OPEN CHOLECYSTECTOMY (N/A)  Principal Problem: Weakness Active Problems: ANEMIA-IRON DEFICIENCY Essential hypertension Atrial fibrillation/flutter - on Eliquis LBBB -  pacemaker COPD with emphysema (HCC) GERD (gastroesophageal reflux disease) Chronic diastolic CHF (congestive heart failure) (HCC) TIA (transient ischemic attack) - 05/31/17 AKI (acute kidney injury) (HCC) Abnormal LFTs Hypotension HLD (hyperlipidemia) CAD (coronary artery disease) Protein-calorie malnutrition, severe (HCC) Extensive abdominal surgical history  Cholelithiasis/Choledocholithiasis, POD 3, s/p open cholecystectomy -s/p ERCP with stent placementand repeat ERCP with clearance of her duct and sphincterotomy -patient doing well. Scheduled tylenol, increased dose of neurontin, oxy q 4 hrs. -mobilize TID in halls -IS   GYI:RSWNIOEV regular diet VTE: SCD's; Eliquis Follow up:Dr. Ramirez/[facility]  DISPO: Wean narcotics IS Mobilize Anticipate d/c home tomorrow - feels unsafe leaving today due to her recent hgb drift although now stable. If H&H remains stable tomorrow, likely discharge home   LOS: 9 days   Ileana Roup 06/30/2017

## 2017-06-30 NOTE — Plan of Care (Signed)
  Problem: Safety: Goal: Ability to remain free from injury will improve Outcome: Progressing   Problem: Activity: Goal: Capacity to carry out activities will improve Outcome: Progressing

## 2017-06-30 NOTE — Plan of Care (Signed)
  Problem: Activity: Goal: Risk for activity intolerance will decrease Outcome: Completed/Met   Problem: Nutrition: Goal: Adequate nutrition will be maintained Outcome: Completed/Met   

## 2017-06-30 NOTE — Progress Notes (Signed)
Patient continues to refuse bed alarm. Will continue to monitor patient. 

## 2017-07-01 LAB — CBC
HCT: 26.3 % — ABNORMAL LOW (ref 36.0–46.0)
Hemoglobin: 8.2 g/dL — ABNORMAL LOW (ref 12.0–15.0)
MCH: 28.6 pg (ref 26.0–34.0)
MCHC: 31.2 g/dL (ref 30.0–36.0)
MCV: 91.6 fL (ref 78.0–100.0)
Platelets: 294 10*3/uL (ref 150–400)
RBC: 2.87 MIL/uL — ABNORMAL LOW (ref 3.87–5.11)
RDW: 14.8 % (ref 11.5–15.5)
WBC: 14.7 10*3/uL — ABNORMAL HIGH (ref 4.0–10.5)

## 2017-07-01 LAB — BASIC METABOLIC PANEL
Anion gap: 8 (ref 5–15)
BUN: 16 mg/dL (ref 6–20)
CO2: 31 mmol/L (ref 22–32)
Calcium: 8.3 mg/dL — ABNORMAL LOW (ref 8.9–10.3)
Chloride: 96 mmol/L — ABNORMAL LOW (ref 101–111)
Creatinine, Ser: 1.32 mg/dL — ABNORMAL HIGH (ref 0.44–1.00)
GFR calc Af Amer: 45 mL/min — ABNORMAL LOW (ref >60)
GFR calc non Af Amer: 39 mL/min — ABNORMAL LOW (ref >60)
Glucose, Bld: 95 mg/dL (ref 65–99)
Potassium: 4.6 mmol/L (ref 3.5–5.1)
Sodium: 135 mmol/L (ref 135–145)

## 2017-07-01 MED ORDER — OXYCODONE HCL 5 MG PO TABS: 10.0000 mg | ORAL_TABLET | Freq: Four times a day (QID) | ORAL | 0 refills | Status: DC | PRN

## 2017-07-01 MED ORDER — PANTOPRAZOLE SODIUM 40 MG PO TBEC
40.0000 mg | DELAYED_RELEASE_TABLET | Freq: Two times a day (BID) | ORAL | Status: DC
  Administered 2017-07-01 – 2017-07-02 (×2): 40 mg via ORAL
  Filled 2017-07-01 (×2): qty 1

## 2017-07-01 MED ORDER — TIZANIDINE HCL 4 MG PO TABS: 2.0000 mg | ORAL_TABLET | Freq: Three times a day (TID) | ORAL | Status: DC | PRN

## 2017-07-01 MED ORDER — PANTOPRAZOLE SODIUM 40 MG PO TBEC: 40.0000 mg | DELAYED_RELEASE_TABLET | Freq: Every day | ORAL | Status: DC

## 2017-07-01 MED ORDER — SODIUM CHLORIDE 0.9 % IV BOLUS
250.0000 mL | Freq: Once | INTRAVENOUS | Status: AC
  Administered 2017-07-01: 250 mL via INTRAVENOUS

## 2017-07-01 MED ORDER — POLYETHYLENE GLYCOL 3350 17 G PO PACK: 17.0000 g | PACK | Freq: Every day | ORAL | 0 refills | Status: DC

## 2017-07-01 MED ORDER — GABAPENTIN 600 MG PO TABS
600.0000 mg | ORAL_TABLET | Freq: Three times a day (TID) | ORAL | Status: DC
  Administered 2017-07-01 – 2017-07-02 (×3): 600 mg via ORAL
  Filled 2017-07-01 (×3): qty 1

## 2017-07-01 MED ORDER — ACETAMINOPHEN 325 MG PO TABS: 650.0000 mg | ORAL_TABLET | ORAL | Status: DC | PRN

## 2017-07-01 NOTE — Progress Notes (Signed)
Physical Therapy Treatment Patient Details Name: [name] MRN: [id number] DOB: Aug 10, 1945 Today's Date: 07/01/2017    History of Present Illness Pt is a 72 y.o. female admitted 06/19/17 with c/o weakness and lightheadedness. CT negative for acute intracranial abnormality. Worked up for weakness likely due to hypotension, requiring fluids. CT scan showed abnormal appearing gallbladder with concern for acute cholecystitis. Now s/p ERCP with stent placement 4/11. PMH includes HTN, COPD, pacemaker, CHF, a-fib, anemia. Recent admission 3/14 with workup for TIA.    PT Comments    Patient received in bed, very pleasant and willing to participate in PT session today. She continues to be able to perform functional bed mobility with Mod(I), and was able to complete functional transfers and gait with S and RW, 3LPM O2 today. Able to complete self-care while toileting with S, and able to ambulate approximately 12f before becoming fatigued this session. She declines up to chair and was left in bed with all needs met this afternoon.     Follow Up Recommendations  Home health PT;Supervision for mobility/OOB     Equipment Recommendations  None recommended by PT    Recommendations for Other Services       Precautions / Restrictions Precautions Precautions: Fall Precaution Comments: mildly unsteady on her feet.  Restrictions Weight Bearing Restrictions: No    Mobility  Bed Mobility Overal bed mobility: Modified Independent             General bed mobility comments: Able to get EOB with rail and HOB mildly elevated.   Transfers Overall transfer level: Needs assistance Equipment used: Rolling walker (2 wheeled) Transfers: Sit to/from Stand Sit to Stand: Supervision         General transfer comment: able to perform multiple sit to stands with S for safety today, able to perform self care at toilet with S   Ambulation/Gait Ambulation/Gait assistance: Supervision Ambulation  Distance (Feet): 80 Feet Assistive device: Rolling walker (2 wheeled) Gait Pattern/deviations: Step-through pattern;Decreased stride length     General Gait Details: able to progress gait distance today, steady at self-selected gait speed today but limited by fatigue    Stairs             Wheelchair Mobility    Modified Rankin (Stroke Patients Only)       Balance Overall balance assessment: Needs assistance Sitting-balance support: Feet supported;No upper extremity supported Sitting balance-Leahy Scale: Good Sitting balance - Comments: Indep to don socks sitting on BSC   Standing balance support: During functional activity;Bilateral upper extremity supported Standing balance-Leahy Scale: Good                              Cognition Arousal/Alertness: Awake/alert Behavior During Therapy: WFL for tasks assessed/performed Overall Cognitive Status: Within Functional Limits for tasks assessed                                        Exercises      General Comments        Pertinent Vitals/Pain Pain Assessment: Faces Faces Pain Scale: Hurts even more Pain Location: Abdomen, back  Pain Descriptors / Indicators: Sore;Cramping Pain Intervention(s): Monitored during session;Repositioned;Limited activity within patient's tolerance    Home Living                      Prior Function  PT Goals (current goals can now be found in the care plan section) Acute Rehab PT Goals Patient Stated Goal: to go home and feel back to normal PT Goal Formulation: With patient Time For Goal Achievement: 07/12/17 Potential to Achieve Goals: Good Progress towards PT goals: Progressing toward goals    Frequency    Min 3X/week      PT Plan Current plan remains appropriate    Co-evaluation              AM-PAC PT "6 Clicks" Daily Activity  Outcome Measure  Difficulty turning over in bed (including adjusting bedclothes,  sheets and blankets)?: None Difficulty moving from lying on back to sitting on the side of the bed? : None Difficulty sitting down on and standing up from a chair with arms (e.g., wheelchair, bedside commode, etc,.)?: None Help needed moving to and from a bed to chair (including a wheelchair)?: A Little Help needed walking in hospital room?: A Little Help needed climbing 3-5 steps with a railing? : A Little 6 Click Score: 21    End of Session Equipment Utilized During Treatment: Gait belt;Oxygen Activity Tolerance: Patient tolerated treatment well Patient left: in chair;with call bell/phone within reach;with nursing/sitter in room Nurse Communication: Mobility status PT Visit Diagnosis: Difficulty in walking, not elsewhere classified (R26.2);Other abnormalities of gait and mobility (R26.89)     Time: 1401-1420 PT Time Calculation (min) (ACUTE ONLY): 19 min  Charges:  $Gait Training: 8-22 mins                    G Codes:       Deniece Ree PT, DPT, CBIS  Supplemental Physical Therapist [place]   Pager [phone]

## 2017-07-01 NOTE — Progress Notes (Signed)
Pt c/o indigestion, says she takes protonix BID at home and it is ordered daily here. MD notified.

## 2017-07-01 NOTE — Care Management Note (Signed)
Case Management Note  Patient Details  Name: [name] MRN: [id number] Date of Birth: [birth year]  Subjective/Objective:   Weakness, Cholecystectomy                Action/Plan: PCP: Ann Held, DO; has private insurance with [employer] with prescription drug coverage; pharmacy of choice is [facility]; [place] choice offered, pt chose [facility]; Dan with [facility] called for arrangements; DME - cane at home; CM will continue to follow for progression of care.  Expected Discharge Date:  07/02/2017              Expected Discharge Plan:  [place]  In-House Referral:   [facility]  Discharge planning Services  CM Consult     Choice offered to:  Patient  HH Arranged:  PT [place] Agency:  [place]  Status of Service:  In process, will continue to follow  Sherrilyn Rist [phone] 07/01/2017, 2:30 PM

## 2017-07-01 NOTE — Care Management Important Message (Signed)
Important Message  Patient Details  Name: [name] MRN: [id number] Date of Birth: June 13, 1945   Medicare Important Message Given:  Yes    Equan Cogbill P Tesla Keeler 07/01/2017, 1:12 PM

## 2017-07-01 NOTE — Progress Notes (Signed)
[facility] Progress Note  Patient: [name] RWE:[id number]   PCP: Ann Held, DO DOB: [birth year]   DOA: 06/19/2017   DOS: 07/01/2017   Date of Service: the patient was seen and examined on 07/01/2017  Subjective: has constipation, some nausea no vomiting, oral intake reduced, per Husband pt has been sleepy since last few days.   Brief hospital course: Becky H Hodgesis a 72 y.o.femalewith medical history significant ofhypertension, hyperlipidemia, COPD, bronchiectasis, GERD, atrial fibrillation on Eliquis, pacemaker placement, left bundle blockage, gastric ulcer, GI bleeding, dCHF, anemia, psoriasis, abnormal liver function. She presented with lightheadedness found to have hypotension. Now with elevated LFTssecondary to choledocholithiasis. Currently further plan is monitor postoperative recovery.  Assessment and Plan: 1. Acute cholecystitis choledocholithiasis and ascending cholangitis Elevated LFTs with bilirubin. RUQ [place] significant for cholelithiasis. HIDA scan was concerning for questionable pancreatic mass/tumor.  CT scan which was done showed abnormal appearing gallbladder with concern for acute cholecystitis. Patient subsequently underwent a ERCP with sphincterectomy and biliary stent placement and stone extraction.   due to recent TIA neurology has been consulted for clearance. Per surgery recommendation, started on empiric IV Levaquin. Tolerated open cholecystectomy. Post-op pain management has been difficult Pt asking for round the clock Oxy IR and wanted to further increase the regimen, gabapentin was increased and zanaflex was changed to scheduled. Now pt fatigued and family is concerned  Will continue OXy IR, reduce Zanaflex. Also back to home dose of gabapentin.  2. Postoperative acute blood loss anemia. Hemoglobin lower but stable.  Will monitor. Most likely this is acute blood loss with some worsening due to dilutional aspect. Repeat H&H is  about the same. Transfuse for hemoglobin less than 7  3.  Hypertension BP soft today. Continue Coreg, hold Lasix. Lisinopril was started for uncontrolled blood pressure, with hypotension and AKI will stop that Hydralazine as needed for systolic blood pressure greater than 180.   4.  Esophageal reflux disease Pt was stared on PPI BID by General surgery, will reduce to daily.  5.  COPD stable Continue Spiriva.  Nebs as needed.  6.  Severe protein calorie malnutrition Continue nutritional supplementation.  7.  Atrial fibrillation Continue Coreg for rate control.   Resume Eliquis for now.  8.  Acute kidney injury Resolved with hydration. Now renal function worse again likely combination of hypotension and ACEI, will give IVF  9.  History of TIA on Eliquis and Lipitor  10.  Hyperlipidemia Resume lipitor   11.  Status post left bundle branch block/chronic diastolic heart failure/artery artery disease Status post PPM. Continue Coreg, Hold Lasix, Stop ACE inhibitor.  Stable.  12.  Chronic pain Continue home dose pain regimen.  Diet: Per surgery, on regular diet DVT Prophylaxis: on therapeutic anticoagulation.  Advance goals of care discussion: Full code  Family Communication: family was present at bedside, at the time of interview.   Disposition:  Discharge to home.  Consultants: General surgery Gastroenterology  Neurology   Procedures:  ERCP 06/23/2017, 06/24/2017  CT abdomen and pelvis 06/21/2017  CT head 06/20/2017  Chest x-ray 06/20/2017  ERCP with placement of plastic biliary stent/choledocholithiasis 06/23/2017 per Dr. Lyndel Safe  ERCP with biliary stent removal and presumed biliary sweeping and sphincterotomy.  Suspected cholelithiasis.  Dr. Lyndel Safe 07/04/2017  HIDA scan 06/21/2017  Antibiotics: Anti-infectives (From admission, onward)   Start     Dose/Rate Route Frequency Ordered Stop   06/27/17 0715  levofloxacin (LEVAQUIN) IVPB 500 mg  Status:   Discontinued  500 mg 100 mL/hr over 60 Minutes Intravenous To Surgery 06/27/17 0709 06/27/17 1052   06/24/17 1500  levofloxacin (LEVAQUIN) IVPB 500 mg  Status:  Discontinued     500 mg 100 mL/hr over 60 Minutes Intravenous Daily-1800 06/24/17 1327 06/27/17 1111     Objective: Physical Exam: Vitals:   06/30/17 1220 06/30/17 2049 07/01/17 0512 07/01/17 1155  BP: 114/83 (!) 121/55 93/70 (!) 104/51  Pulse: 60 77 71 68  Resp: 18 18 18 18   Temp:  98.3 F (36.8 C) 99 F (37.2 C) 98.3 F (36.8 C)  TempSrc:  Oral Oral Oral  SpO2: 99% 99% 99% 100%  Weight:   46.2 kg (101 lb 12.8 oz)   Height:        Intake/Output Summary (Last 24 hours) at 07/01/2017 1401 Last data filed at 07/01/2017 0517 Gross per 24 hour  Intake 480 ml  Output 1300 ml  Net -820 ml   Filed Weights   06/29/17 0500 06/30/17 0433 07/01/17 0512  Weight: 46.1 kg (101 lb 9.6 oz) 45.9 kg (101 lb 4.8 oz) 46.2 kg (101 lb 12.8 oz)   General: Alert, Awake and Oriented to Time, Place and Person. Appear in moderate distress, affect flat Eyes: PERRL, Conjunctiva normal ENT: Oral Mucosa clear moist. Neck: no JVD, no Abnormal Mass Or lumps Cardiovascular: S1 and S2 Present, no Murmur, Peripheral Pulses Present Respiratory: normal respiratory effort, Bilateral Air entry equal and Decreased, no use of accessory muscle, Clear to Auscultation, no Crackles, no wheezes Abdomen: Bowel Sound present, Soft and RUQ tenderness, no hernia Skin: no redness, no Rash, no induration Extremities: no Pedal edema, no calf tenderness Neurologic: Grossly no focal neuro deficit. Bilaterally Equal motor strength  Data Reviewed: CBC: Recent Labs  Lab 06/28/17 0618 06/29/17 0725 06/29/17 1128 06/30/17 0409 07/01/17 0643  WBC 11.9* 7.0 6.7 6.5 14.7*  HGB 8.6* 7.4* 7.4* 7.8* 8.2*  HCT 26.9* 23.1* 23.1* 24.6* 26.3*  MCV 90.3 89.5 89.5 89.1 91.6  PLT 256 181 194 195 481   Basic Metabolic Panel: Recent Labs  Lab 06/25/17 0228   06/27/17 0456 06/28/17 0618 06/29/17 0725 06/30/17 0409 07/01/17 0643  NA 138   < > 140 136 137 137 135  K 3.4*   < > 3.5 4.0 3.4* 3.6 4.6  CL 101   < > 103 102 100* 99* 96*  CO2 27   < > 28 24 31 29 31   GLUCOSE 106*   < > 91 104* 102* 114* 95  BUN 9   < > 5* 6 6 9 16   CREATININE 0.75   < > 0.77 0.78 0.90 0.85 1.32*  CALCIUM 8.2*   < > 8.1* 8.4* 8.0* 8.0* 8.3*  MG 1.5*  --  1.8  --  1.6*  --   --    < > = values in this interval not displayed.    Liver Function Tests: Recent Labs  Lab 06/25/17 0228 06/26/17 0423 06/27/17 0456 06/28/17 0618 06/29/17 0725  AST 41 30 21 50* 27  ALT 207* 136* 100* 96* 64*  ALKPHOS 338* 284* 251* 230* 179*  BILITOT 0.8 0.3 0.5 0.6 0.4  PROT 5.9* 5.6* 5.7* 6.0* 5.4*  ALBUMIN 2.7* 2.6* 2.6* 2.8* 2.4*   No results for input(s): LIPASE, AMYLASE in the last 168 hours. No results for input(s): AMMONIA in the last 168 hours. Coagulation Profile: No results for input(s): INR, PROTIME in the last 168 hours. Cardiac Enzymes: No results for input(s): CKTOTAL, CKMB, CKMBINDEX,  TROPONINI in the last 168 hours. BNP (last 3 results) No results for input(s): PROBNP in the last 8760 hours. CBG: No results for input(s): GLUCAP in the last 168 hours. Studies: No results found.  Scheduled Meds: . acetaminophen  650 mg Oral Q4H  . apixaban  5 mg Oral BID  . carvedilol  25 mg Oral BID  . gabapentin  600 mg Oral TID  . [START ON 07/02/2017] pantoprazole  40 mg Oral Daily  . polyethylene glycol  17 g Oral Daily  . senna-docusate  1 tablet Oral BID  . tiotropium  1 capsule Inhalation Daily   Continuous Infusions: . sodium chloride 250 mL (07/01/17 1349)   PRN Meds: albuterol, nitroGLYCERIN, ondansetron, oxyCODONE, tiZANidine, zolpidem  Time spent: 35 minutes  Author: Berle Mull, MD [facility] Pager: [phone] 07/01/2017 2:01 PM  If 7PM-7AM, please contact night-coverage at [email or web address], password [facility]

## 2017-07-01 NOTE — Progress Notes (Signed)
Patient ID: [name], female   DOB: 1945/04/21, 72 y.o.   MRN: [id number]    4 Days Post-Op  Subjective: Pt feeling better.  On oral pain meds.  Some nausea last night secondary to reflux.  Tolerating a heart healthy diet.  Objective: Vital signs in last 24 hours: Temp:  [98.3 F (36.8 C)-99 F (37.2 C)] 99 F (37.2 C) (04/15 0512) Pulse Rate:  [60-77] 71 (04/15 0512) Resp:  [18] 18 (04/15 0512) BP: (93-121)/(55-83) 93/70 (04/15 0512) SpO2:  [99 %] 99 % (04/15 0512) Weight:  [46.2 kg (101 lb 12.8 oz)] 46.2 kg (101 lb 12.8 oz) (04/15 0512) Last BM Date: 06/29/17  Intake/Output from previous day: 04/14 0701 - 04/15 0700 In: 920 [P.O.:920] Out: 2300 [Urine:2300] Intake/Output this shift: No intake/output data recorded.  PE: Abd: soft, appropriately tender, +BS, ND, RUQ incision is c/d/i with staples and a honeycomb dressing in place.  Lab Results:  Recent Labs    06/29/17 1128 06/30/17 0409  WBC 6.7 6.5  HGB 7.4* 7.8*  HCT 23.1* 24.6*  PLT 194 195   BMET Recent Labs    06/29/17 0725 06/30/17 0409  NA 137 137  K 3.4* 3.6  CL 100* 99*  CO2 31 29  GLUCOSE 102* 114*  BUN 6 9  CREATININE 0.90 0.85  CALCIUM 8.0* 8.0*   PT/INR No results for input(s): LABPROT, INR in the last 72 hours. CMP     Component Value Date/Time   NA 137 06/30/2017 0409   K 3.6 06/30/2017 0409   CL 99 (L) 06/30/2017 0409   CO2 29 06/30/2017 0409   GLUCOSE 114 (H) 06/30/2017 0409   GLUCOSE 104 (H) 03/11/2006 1059   BUN 9 06/30/2017 0409   CREATININE 0.85 06/30/2017 0409   CREATININE 0.91 03/16/2016 1539   CALCIUM 8.0 (L) 06/30/2017 0409   PROT 5.4 (L) 06/29/2017 0725   ALBUMIN 2.4 (L) 06/29/2017 0725   AST 27 06/29/2017 0725   ALT 64 (H) 06/29/2017 0725   ALKPHOS 179 (H) 06/29/2017 0725   BILITOT 0.4 06/29/2017 0725   GFRNONAA >60 06/30/2017 0409   GFRAA >60 06/30/2017 0409   Lipase     Component Value Date/Time   LIPASE 8 (L) 12/11/2011 1455        Studies/Results: No results found.  Anti-infectives: Anti-infectives (From admission, onward)   Start     Dose/Rate Route Frequency Ordered Stop   06/27/17 0715  levofloxacin (LEVAQUIN) IVPB 500 mg  Status:  Discontinued     500 mg 100 mL/hr over 60 Minutes Intravenous To Surgery 06/27/17 0709 06/27/17 1052   06/24/17 1500  levofloxacin (LEVAQUIN) IVPB 500 mg  Status:  Discontinued     500 mg 100 mL/hr over 60 Minutes Intravenous Daily-1800 06/24/17 1327 06/27/17 1111       Assessment/Plan Principal Problem: Weakness Active Problems: ANEMIA-IRON DEFICIENCY Essential hypertension Atrial fibrillation/flutter - on Eliquis LBBB - pacemaker COPD with emphysema (HCC) GERD (gastroesophageal reflux disease) Chronic diastolic CHF (congestive heart failure) (HCC) TIA (transient ischemic attack) - 05/31/17 AKI (acute kidney injury) ([facility]) Abnormal LFTs Hypotension HLD (hyperlipidemia) CAD (coronary artery disease) Protein-calorie malnutrition, severe (HCC) Extensive abdominal surgical history  Cholelithiasis/Choledocholithiasis, POD 4, s/p open cholecystectomy -s/p ERCP with stent placementand repeat ERCP with clearance of her duct and sphincterotomy -patient doing well. Scheduled tylenol, increased dose of neurontin, oxy q 4 hrs. -mobilize TID in halls -IS  DDU:KGURKYHC regular diet VTE: SCD's; Eliquis Follow up:[facility] clinic  DISPO: Patient is stable for  DC home from surgical standpoint.  hgb stable yesterday.  Pending today.  Staple removal appt and follow up appt have been made and are in the DC instruction section.    LOS: 10 days    Henreitta Cea , [facility] Surgery 07/01/2017, 7:46 AM Pager: [phone]

## 2017-07-01 NOTE — Plan of Care (Signed)
  Problem: Elimination: Goal: Will not experience complications related to urinary retention Outcome: Progressing   Problem: Safety: Goal: Ability to remain free from injury will improve Outcome: Progressing   Problem: Activity: Goal: Capacity to carry out activities will improve Outcome: Progressing

## 2017-07-02 ENCOUNTER — Other Ambulatory Visit: Payer: Self-pay | Admitting: Family Medicine

## 2017-07-02 DIAGNOSIS — G4700 Insomnia, unspecified: Secondary | ICD-10-CM

## 2017-07-02 LAB — CBC
HCT: 26.1 % — ABNORMAL LOW (ref 36.0–46.0)
Hemoglobin: 8.3 g/dL — ABNORMAL LOW (ref 12.0–15.0)
MCH: 28.9 pg (ref 26.0–34.0)
MCHC: 31.8 g/dL (ref 30.0–36.0)
MCV: 90.9 fL (ref 78.0–100.0)
Platelets: 270 10*3/uL (ref 150–400)
RBC: 2.87 MIL/uL — ABNORMAL LOW (ref 3.87–5.11)
RDW: 14.8 % (ref 11.5–15.5)
WBC: 12.2 10*3/uL — ABNORMAL HIGH (ref 4.0–10.5)

## 2017-07-02 LAB — BASIC METABOLIC PANEL WITH GFR
Anion gap: 8 (ref 5–15)
BUN: 17 mg/dL (ref 6–20)
CO2: 31 mmol/L (ref 22–32)
Calcium: 8.2 mg/dL — ABNORMAL LOW (ref 8.9–10.3)
Chloride: 98 mmol/L — ABNORMAL LOW (ref 101–111)
Creatinine, Ser: 0.85 mg/dL (ref 0.44–1.00)
GFR calc Af Amer: >60 mL/min (ref >60)
GFR calc non Af Amer: >60 mL/min (ref >60)
Glucose, Bld: 99 mg/dL (ref 65–99)
Potassium: 4.4 mmol/L (ref 3.5–5.1)
Sodium: 137 mmol/L (ref 135–145)

## 2017-07-02 MED ORDER — AMLODIPINE BESYLATE 5 MG PO TABS
5.0000 mg | ORAL_TABLET | Freq: Every day | ORAL | Status: DC
  Administered 2017-07-02: 5 mg via ORAL
  Filled 2017-07-02: qty 1

## 2017-07-02 MED ORDER — AMLODIPINE BESYLATE 5 MG PO TABS: 5.0000 mg | ORAL_TABLET | Freq: Every day | ORAL | 0 refills | Status: DC

## 2017-07-02 NOTE — Progress Notes (Signed)
Physical Therapy Treatment Patient Details Name: [name] MRN: [id number] DOB: 03-27-1945 Today's Date: 07/02/2017    History of Present Illness Pt is a 72 y.o. female admitted 06/19/17 with c/o weakness and lightheadedness. CT negative for acute intracranial abnormality. Pt with acute cholecystitis s/p ERCP with stent placement 4/11. PMHx: HTN, COPD, pacemaker, CHF, a-fib, anemia    PT Comments    Pt pleasant and very willing to mobilize. Pt with SpO2 >90% throughout on 3L with increased gait and able to increase distance but limited by back pain. Pt refused OOB to chair due to back pain. Pt educated for HEP and encouraged to implement daily as well as continue gait.  HR 68-76    Follow Up Recommendations  Home health PT;Supervision for mobility/OOB     Equipment Recommendations  None recommended by PT    Recommendations for Other Services       Precautions / Restrictions Precautions Precautions: Fall    Mobility  Bed Mobility Overal bed mobility: Modified Independent             General bed mobility comments: HOB elevated and pt denied chair end of session to and from bed  Transfers Overall transfer level: Modified independent               General transfer comment: pt stood from bed and BSC with pivot Bed>BSC without assist  Ambulation/Gait Ambulation/Gait assistance: Min assist Ambulation Distance (Feet): 200 Feet Assistive device: 1 person hand held assist Gait Pattern/deviations: Step-through pattern;Decreased stride length     General Gait Details: pt with increased distance today but denied further gait with SpO2 maintained 93-96% on 3L throughout. PT with RUE assist throughout and unable to ambulate without reaching for environmental support   Stairs             Wheelchair Mobility    Modified Rankin (Stroke Patients Only)       Balance Overall balance assessment: Needs assistance   Sitting balance-Leahy Scale: Good        Standing balance-Leahy Scale: Fair                              Cognition Arousal/Alertness: Awake/alert Behavior During Therapy: WFL for tasks assessed/performed Overall Cognitive Status: Within Functional Limits for tasks assessed                                        Exercises General Exercises - Lower Extremity Long Arc Quad: AROM;15 reps;Seated;Both Hip Flexion/Marching: AROM;15 reps;Seated;Both    General Comments        Pertinent Vitals/Pain Pain Assessment: 0-10 Pain Score: 4  Pain Location: back Pain Descriptors / Indicators: Sore Pain Intervention(s): Limited activity within patient's tolerance    Home Living                      Prior Function            PT Goals (current goals can now be found in the care plan section) Progress towards PT goals: Progressing toward goals    Frequency           PT Plan Current plan remains appropriate    Co-evaluation              AM-PAC PT "6 Clicks" Daily Activity  Outcome Measure  Difficulty turning over  in bed (including adjusting bedclothes, sheets and blankets)?: None Difficulty moving from lying on back to sitting on the side of the bed? : None Difficulty sitting down on and standing up from a chair with arms (e.g., wheelchair, bedside commode, etc,.)?: None Help needed moving to and from a bed to chair (including a wheelchair)?: None Help needed walking in hospital room?: A Little Help needed climbing 3-5 steps with a railing? : A Little 6 Click Score: 22    End of Session Equipment Utilized During Treatment: Oxygen Activity Tolerance: Patient tolerated treatment well Patient left: in bed;with call bell/phone within reach Nurse Communication: Mobility status PT Visit Diagnosis: Other abnormalities of gait and mobility (R26.89);Difficulty in walking, not elsewhere classified (R26.2)     Time: 3845-3646 PT Time Calculation (min) (ACUTE ONLY): 19  min  Charges:  $Gait Training: 8-22 mins                    G Codes:       Elwyn Reach, PT [phone]    [place] 07/02/2017, 11:15 AM

## 2017-07-02 NOTE — Consult Note (Signed)
   [facility] CM Inpatient Consult   07/02/2017  [name] 23-Sep-1945 [id number]  Follow up:  Met again with the patient at bedside to follow for her long length of stay.  Patient states she does not feel she needs any post hospital follow up other than [place].  She states she will be fine with having her family at home and all.  She states she has no needs.  States, "I'll just be glad to get home and get some food that I like since the lap choley has helped a lot with my symptoms.  I have your contact information and your brochure, my husband too them home." No needs assessed or issues per patient.  For questions, please contact:  Natividad Brood, RN BSN [facility] Liaison  [phone] business mobile phone Toll free office [phone]

## 2017-07-03 ENCOUNTER — Telehealth: Payer: Self-pay | Admitting: *Deleted

## 2017-07-03 NOTE — Telephone Encounter (Signed)
[facility] [address] requesting refill for [employer] ran and is on your desk for review.  Last filled per database: 06/16/17 Last written: 03/07/17 Last ov: 06/06/17 Next ov: 07/08/17 Contract: 03/07/18 UDS:  03/07/18

## 2017-07-03 NOTE — Discharge Summary (Signed)
[facility] Discharge Summary   Patient: [name] BPZ:[id number]   PCP: Ann Held, DO DOB: 15-Nov-1945   Date of admission: 06/19/2017   Date of discharge: 07/02/2017   Discharge Diagnoses:  Principal Problem:   Weakness Active Problems:   ANEMIA-IRON DEFICIENCY   Essential hypertension   Atrial fibrillation/flutter   COPD with emphysema (HCC)   GERD (gastroesophageal reflux disease)   Chronic diastolic CHF (congestive heart failure) (HCC)   TIA (transient ischemic attack)   AKI (acute kidney injury) ([place])   Abnormal LFTs   Hypotension   HLD (hyperlipidemia)   CAD (coronary artery disease)   Protein-calorie malnutrition, severe (HCC)   Elevated ALT measurement   Admitted From: home Disposition:  Home with home health  Recommendations for Outpatient Follow-up:  1. Please follow-up with surgery as recommended, PCP in 1 week.  Follow-up Information    [facility], [place] Follow up on 07/04/2017.   Specialty:  General Surgery Why:  11:15am, arrive by 11:00am for check-in.  this is a nurse only appointment for staple removal Contact information: [place] [address] [phone]        Carlena Hurl, PA-C Follow up on 07/11/2017.   Specialty:  General Surgery Why:  You will be seen in the Doc of the [facility] on this day at 3:45pm.  please arrive by 3:30 for check in Contact information: [place] [address] [phone]        Lowne Chase, Alferd Apa, DO. Schedule an appointment as soon as possible for a visit in 1 week(s).   Specialty:  Family Medicine Why:  repeat BMP Contact information: [address] [place] [address] [facility] Follow up.   Why:  They will do your home health care at your home  Contact information: [address] [place] [address] [phone]          Diet recommendation: Cardiac  diet  Activity: The patient is advised to gradually reintroduce usual activities.  Discharge Condition: good  Code Status: Full code  History of present illness: As per the H and P dictated on admission, " [name] is a 72 y.o. female with medical history significant of hypertension, hyperlipidemia, COPD, bronchiectasis, GERD, atrial fibrillation on Eliquis, pacemaker placement, left bundle blockage, gastric ulcer, GI bleeding, dCHF, anemia, psoriasis, abnormal liver function, who presents with weakness, lightheadedness.  Pt was recently hospitalized from 03/14-/16 due to UTI and a TIA. Patient had TIA workup. CAT of head and neck showed age indeterminated occluded left vertebral artery from origin to C3 level. CT of head showed subcentimeter left frontal dural calcification which may be a dural plaque or tiny meningioma. MRI of brain could not be done due to Presents of pacemaker. 2-D echo showed EF of 55-60 percent with grade 1 diastolic dysfunction. Pt was d/c'ed on Eliquis for A fib.   Pt states that she has weakness again today. She has frontal headache, mild lightheadedness and dizziness. She dropped things in right hand. Currently she denies unilateral weakness, numbness or tingling in extremities. No facial droop, slurred speech, vision change, hearing loss, difficulty swallowing. Patient has nausea, but no vomiting, diarrhea or abdominal pain. Denies symptoms of UTI. Patient has mild SOB, but no chest pain, cough, fever or chills. Patient was found to have hypotension initially, with blood pressure 73/47, which  improved to SBP>100 after 1 L normal saline bolus."  Hospital Course:  Summary of her active problems in the hospital is as following. 1. Acute cholecystitis choledocholithiasis and ascending cholangitis Elevated LFTs with bilirubin. RUQ [place] significant for cholelithiasis. HIDA scan was concerning for questionable pancreatic mass/tumor.  CT scan which was done showed  abnormal appearing gallbladder with concern for acute cholecystitis. Patient subsequently underwent a ERCP with sphincterectomy and biliary stent placement and stone extraction.  due to recent TIA neurology has been consulted for clearance. Per surgery recommendation, started on empiric IV Levaquin. Tolerated open cholecystectomy. Post-op pain management has been difficult Pt asking for round the clock Oxy IR and wanted to further increase the regimen, gabapentin was increased and zanaflex was changed to scheduled. Now pt fatigued and family is concerned  Will continue OXy IR,  Zanaflex. Also back to home dose of gabapentin.  2. Postoperative acute blood loss anemia. Hemoglobin lower but stable.  Will monitor. Most likely this is acute blood loss with some worsening due to dilutional aspect. Repeat H&H is stable.  3. Hypertension BPsoft today. Continue Coreg, hold Lasix. Lisinopril was started for uncontrolled blood pressure, with hypotension and AKI will stop that  4. Esophageal reflux disease Continue home PPI.  5. COPD stable Continue Spiriva. Nebs as needed.  6. Severe protein calorie malnutrition Continue nutritional supplementation.  7. Atrial fibrillation Continue Coreg for rate control.  Resume Eliquis for now.  8. Acute kidney injury Resolved with hydration.  9. History of TIA on Eliquis and Lipitor  10. Hyperlipidemia Resume lipitor   11. Status post left bundle branch block/chronic diastolic heart failure/artery artery disease Status post PPM. Continue Coreg,  Lasix. Hold ACE inhibitor due to acute kidney injury and hyperkalemia.  12. Chronic pain Continue current pain regimen per surgery.  Percocet changed to OxyIR due to elevated LFT.  Can resume back to home regimen on follow-up  All other chronic medical condition were stable during the hospitalization.  Patient was seen by physical therapy, who recommended home health, which  was arranged by Education officer, museum and case Freight forwarder. On the day of the discharge the patient's vitals were stable , and no other acute medical condition were reported by patient. the patient was felt safe to be discharge at home with home health.  Procedures and Results:  ERCP 06/23/2017, 06/24/2017  CT abdomen and pelvis 06/21/2017  CT head 06/20/2017  Chest x-ray 06/20/2017  ERCP with placement of plastic biliary stent/choledocholithiasis 06/23/2017 per Dr. Lyndel Safe  ERCP with biliary stent removal and presumed biliary sweeping and sphincterotomy. Suspected cholelithiasis. Dr. Lyndel Safe 07/04/2017  HIDA scan 06/21/2017  Consultations:  General surgery   Gastroenterology   Neurology  DISCHARGE MEDICATION: Allergies as of 07/02/2017      Reactions   Dabigatran Etexilate Mesylate Other (See Comments)   INTERNAL BLEEDING-pradaxa   Talwin [pentazocine] Other (See Comments)   hallucinations   Lactate Rash      Medication List    STOP taking these medications   oxyCODONE-acetaminophen 10-325 MG tablet Commonly known as:  PERCOCET     TAKE these medications   acetaminophen 325 MG tablet Commonly known as:  TYLENOL Take 2 tablets (650 mg total) by mouth every 4 (four) hours as needed.   amLODipine 5 MG tablet Commonly known as:  NORVASC Take 1 tablet (5 mg total) by mouth daily.   apixaban 5 MG Tabs tablet Commonly known as:  ELIQUIS Take 1 tablet (5 mg total) by mouth 2 (two) times daily.  atorvastatin 20 MG tablet Commonly known as:  LIPITOR Take 1 tablet (20 mg total) by mouth daily at 6 PM.   carvedilol 25 MG tablet Commonly known as:  COREG TAKE 1 TABLET BY MOUTH TWICE DAILY What changed:    how much to take  how to take this  when to take this   furosemide 40 MG tablet Commonly known as:  LASIX TAKE ONE TABLET BY MOUTH IN THE MORNING What changed:    how much to take  how to take this  when to take this   gabapentin 600 MG tablet Commonly known as:   NEURONTIN Take 1 tablet (600 mg total) by mouth 3 (three) times daily.   nitroGLYCERIN 0.4 MG SL tablet Commonly known as:  NITROSTAT Place 1 tablet (0.4 mg total) under the tongue every 5 (five) minutes as needed. CHEST PAIN   oxyCODONE 5 MG immediate release tablet Commonly known as:  Oxy IR/ROXICODONE Take 2 tablets (10 mg total) by mouth every 6 (six) hours as needed for moderate pain or severe pain.   pantoprazole 40 MG tablet Commonly known as:  PROTONIX Take 1 tablet (40 mg total) by mouth 2 (two) times daily.   polyethylene glycol packet Commonly known as:  MIRALAX / GLYCOLAX Take 17 g by mouth daily.   promethazine 25 MG tablet Commonly known as:  PHENERGAN Take 1 tablet (25 mg total) by mouth every 8 (eight) hours as needed. for nausea   SPIRIVA RESPIMAT 1.25 MCG/ACT Aers Generic drug:  Tiotropium Bromide Monohydrate Inhale 2 puffs into the lungs daily.   tiZANidine 4 MG tablet Commonly known as:  ZANAFLEX Take 1 tablet (4 mg total) by mouth 3 (three) times daily as needed. for muscle spams   zolpidem 5 MG tablet Commonly known as:  AMBIEN TAKE 1 TABLET BY MOUTH ONCE DAILY AT BEDTIME AS NEEDED FOR  SLEEP What changed:    how much to take  how to take this  when to take this  reasons to take this  additional instructions      Allergies  Allergen Reactions  . Dabigatran Etexilate Mesylate Other (See Comments)    INTERNAL BLEEDING-pradaxa  . Talwin [Pentazocine] Other (See Comments)    hallucinations  . Lactate Rash   Discharge Instructions    Diet - low sodium heart healthy   Complete by:  As directed    Discharge instructions   Complete by:  As directed    It is important that you read following instructions as well as go over your medication list with RN to help you understand your care after this hospitalization.  Discharge Instructions: Please follow-up with PCP in one week  Please request your primary care physician to go over all  Hospital Tests and Procedure/Radiological results at the follow up,  Please get all Hospital records sent to your PCP by signing hospital release before you go home.   Do not take more than prescribed Pain, Sleep and Anxiety Medications. You were cared for by a hospitalist during your hospital stay. If you have any questions about your discharge medications or the care you received while you were in the hospital after you are discharged, you can call the unit and ask to speak with the hospitalist on call if the hospitalist that took care of you is not available.  Once you are discharged, your primary care physician will handle any further medical issues. Please note that NO REFILLS for any discharge medications will be authorized once  you are discharged, as it is imperative that you return to your primary care physician (or establish a relationship with a primary care physician if you do not have one) for your aftercare needs so that they can reassess your need for medications and monitor your lab values. You Must read complete instructions/literature along with all the possible adverse reactions/side effects for all the Medicines you take and that have been prescribed to you. Take any new Medicines after you have completely understood and accept all the possible adverse reactions/side effects. Wear Seat belts while driving. If you have smoked or chewed Tobacco in the last 2 yrs please stop smoking and/or stop any Recreational drug use.   Increase activity slowly   Complete by:  As directed      Discharge Exam: Filed Weights   06/30/17 0433 07/01/17 0512 07/02/17 0346  Weight: 45.9 kg (101 lb 4.8 oz) 46.2 kg (101 lb 12.8 oz) 45.5 kg (100 lb 4.8 oz)   Vitals:   07/02/17 0751 07/02/17 1111  BP: (!) 156/77   Pulse: 68   Resp:    Temp:    SpO2:  95%   General: Appear in no distress, no Rash; Oral Mucosa moist. Cardiovascular: S1 and S2 Present, no Murmur, no JVD Respiratory: Bilateral Air  entry present and Clear to Auscultation, no Crackles, no wheezes Abdomen: Bowel Sound present, Soft and no tenderness Extremities: no Pedal edema, no calf tenderness Neurology: Grossly no focal neuro deficit.  The results of significant diagnostics from this hospitalization (including imaging, microbiology, ancillary and laboratory) are listed below for reference.    Significant Diagnostic Studies: Ct Head Wo Contrast  Result Date: 06/20/2017 CLINICAL DATA:  Headache, trouble walking weakness EXAM: CT HEAD WITHOUT CONTRAST TECHNIQUE: Contiguous axial images were obtained from the base of the skull through the vertex without intravenous contrast. COMPARISON:  05/31/2017, 06/04/2011 FINDINGS: Brain: No acute territorial infarction, hemorrhage or intracranial mass is visualized. Stable ventricle size. Stable calcified left frontal dural nodule. Mild small vessel ischemic changes of the white matter. Vascular: No hyperdense vessels. Scattered calcifications at the carotid siphons Skull: Normal. Negative for fracture or focal lesion. Sinuses/Orbits: Retention cysts in the left sphenoid sinus. Mild mucosal thickening in the ethmoid sinuses. No acute orbital abnormality. Other: None IMPRESSION: 1. No CT evidence for acute intracranial abnormality. 2. Mild atrophy and small vessel ischemic changes of the white matter. Electronically Signed   By: Donavan Foil M.D.   On: 06/20/2017 00:14   Nm Hepatobiliary Liver Func  Result Date: 06/21/2017 CLINICAL DATA:  Patient with elevated LFTs, cholelithiasis and biliary ductal dilatation. EXAM: NUCLEAR MEDICINE HEPATOBILIARY IMAGING TECHNIQUE: Sequential images of the abdomen were obtained out to 60 minutes following intravenous administration of radiopharmaceutical. RADIOPHARMACEUTICALS:  6.70 mCi Tc-56m  Choletec IV COMPARISON:  Right upper quadrant ultrasound 06/20/2017 FINDINGS: Prompt uptake and biliary excretion of activity by the liver is seen. No activity is  demonstrated within the gallbladder or small bowel. IMPRESSION: No radiotracer activity demonstrated within the gallbladder or small bowel compatible with biliary obstruction which may be secondary to common bile duct stone or potentially pancreatic mass/distal CBD tumor. Recommend further evaluation with MRI/MRCP. These results will be called to the ordering clinician or representative by the Radiologist Assistant, and communication documented in the PACS or zVision Dashboard. Electronically Signed   By: Lovey Newcomer M.D.   On: 06/21/2017 12:39   Ct Abdomen Pelvis W Contrast  Result Date: 06/22/2017 CLINICAL DATA:  Abnormal liver function tests EXAM: CT  ABDOMEN AND PELVIS WITHOUT AND WITH CONTRAST TECHNIQUE: Multidetector CT imaging of the abdomen and pelvis was performed using the standard protocol following bolus administration of intravenous contrast. CONTRAST:  141mL ISOVUE-300 IOPAMIDOL (ISOVUE-300) INJECTION 61% COMPARISON:  07/29/2014 CT FINDINGS: Lower chest: Coronary sinus, right atrial and right ventricular pacing leads are noted. Heart is within normal limits for size. No pericardial effusion. Minimal subpleural areas of reticulation pulmonary opacities are noted, nonspecific but may reflect post inflammatory postinfectious change. Mild peribronchial thickening is noted to both lower lobes. Minimal subpleural scarring is seen in the right middle lobe. No effusion or pneumothorax. Hepatobiliary: Pericholecystic fluid is noted with gallbladder thickening and dependent gallstones as well as biliary sludge noted. Findings would be consistent with an acute cholecystitis. The liver is unremarkable. Mild intra and extrahepatic ductal dilatation with soft tissue debris in the distal common bile duct near the ampulla is noted. Pancreas: No pancreatic ductal dilatation or mass.  Inflammation. Spleen: Mild splenomegaly with new 11 mm circumscribed hypodensity within the spleen that may reflect an acquired cyst.  Adrenals/Urinary Tract: Normal bilateral adrenal glands. Mild renal cortical thinning on the left with scarring in the lower pole. A tiny too small to characterize cyst is seen in the lower pole of the left kidney. No nephrolithiasis. Chronic mild caliectasis of the right renal collecting system and right-sided renal pelvis. The urinary bladder is physiologically distended without focal mural thickening. Stomach/Bowel: Decompressed stomach. Enteric contrast noted within small bowel with contrast reaching the cecum. No bowel obstruction or inflammation. Large amount of retained stool in the rectosigmoid. Normal-appearing appendix. Vascular/Lymphatic: Moderate aortoiliac atherosclerosis without aneurysm. No lymphadenopathy. Reproductive: Status post hysterectomy. No adnexal masses. Other: No free air nor free fluid. Musculoskeletal: Right hip arthroplasty. Sacral neural stimulating device. IMPRESSION: 1. Abnormal appearing gallbladder with biliary sludge a calculi. Gallbladder wall thickening pericholecystic fluid raise concern for cholecystitis. There is mild intra and extrahepatic ductal dilatation soft tissue densities in the distal common bowel duct that may reflect biliary sludge/debris. 2. Unremarkable CT appearance of the pancreas. 3. Mild peribronchial thickening with right greater than left lower lobe subpleural pulmonary opacities and reticulations may reflect stigmata postinfectious or postinflammatory change. 4. New 11 mm hypodensity in the spleen may reflect an acquired cyst. There is splenomegaly. 5. Mild bilateral renal cortical thinning with scarring in the left pole. Tiny too small to characterize cyst lower pole the left kidney is also noted. 6. Moderate aortoiliac atherosclerosis.  No lymphadenopathy. Electronically Signed   By: Ashley Royalty M.D.   On: 06/22/2017 01:06   Dg Chest Portable 1 View  Result Date: 06/20/2017 CLINICAL DATA:  Hypotension EXAM: PORTABLE CHEST 1 VIEW COMPARISON:   03/21/2017 FINDINGS: Unchanged position of left chest wall 3 lead AICD leads. Chronic interstitial opacities are unchanged. No new area of consolidation. No pleural effusion or pneumothorax. IMPRESSION: Unchanged appearance of chronic interstitial opacities, likely secondary to chronic mycobacterial infection. Electronically Signed   By: Ulyses Jarred M.D.   On: 06/20/2017 01:15   Dg Ercp Biliary & Pancreatic Ducts  Result Date: 06/24/2017 CLINICAL DATA:  Common bile duct calculi EXAM: ERCP TECHNIQUE: Multiple spot images obtained with the fluoroscopic device and submitted for interpretation post-procedure. COMPARISON:  ERCP-06/23/2017 FLUOROSCOPY TIME:  1 minute FINDINGS: Three spot intraoperative fluoroscopic images of the right upper abdominal quadrant during ERCP are provided for review Images demonstrate removal of internal plastic biliary stent. There is opacification of the common bile duct which again appears mild to moderately dilated. Subsequent images demonstrate  insufflation of a balloon within distal aspect of the CBD with subsequent presumed biliary sweeping and sphincterotomy. There is minimal opacification the central aspect of the biliary tree which appears nondilated. There is opacification of the cystic duct as well as the gallbladder. Several persistent filling defects are seen within the gallbladder suggestive of cholelithiasis. IMPRESSION: 1. ERCP with biliary stent removal and presumed biliary sweeping and sphincterotomy as detailed above. 2. Suspected cholelithiasis. These images were submitted for radiologic interpretation only. Please see the procedural report for the amount of contrast and the fluoroscopy time utilized. Electronically Signed   By: Sandi Mariscal M.D.   On: 06/24/2017 12:06   Dg Ercp Biliary & Pancreatic Ducts  Result Date: 06/23/2017 CLINICAL DATA:  72 year old female with choledocholithiasis EXAM: ERCP TECHNIQUE: Multiple spot images obtained with the fluoroscopic  device and submitted for interpretation post-procedure. FLUOROSCOPY TIME:  Fluoroscopy Time:  0 minutes 28 seconds COMPARISON:  None. FINDINGS: Three saved images are submitted for review. The images demonstrate a flexible endoscope in the descending duodenum with wire cannulation of the common bile duct. Cholangiogram demonstrates numerous filling defects in the common duct consistent with choledocholithiasis. On the final image, a plastic biliary stent has been placed. IMPRESSION: 1. Choledocholithiasis. 2. ERCP with placement of a plastic biliary stent. These images were submitted for radiologic interpretation only. Please see the procedural report for the amount of contrast and the fluoroscopy time utilized. Electronically Signed   By: Jacqulynn Cadet M.D.   On: 06/23/2017 10:18   US Abdomen Limited Ruq  Result Date: 06/20/2017 CLINICAL DATA:  Elevated LFTs EXAM: ULTRASOUND ABDOMEN LIMITED RIGHT UPPER QUADRANT COMPARISON:  None. FINDINGS: Gallbladder: Large echogenic focus is noted in well distended gallbladder. Mild posterior acoustical shadowing is noted in this may represent a prominent 3 cm gallstone. Possibility of a tumefactive sludge ball would deserve consideration as well. Mild gallbladder sludge is noted. No wall thickening or pericholecystic fluid is noted. Common bile duct: Diameter: 9.6 mm. This is slightly prominent for the patient's given age and associated with mild intrahepatic ductal dilatation. Liver: No focal mass lesion is noted. Mild biliary ductal dilatation is noted. Portal vein is patent on color Doppler imaging with normal direction of blood flow towards the liver. IMPRESSION: Changes of biliary dilatation of both the intrahepatic and extrahepatic tree. This raises suspicion for distal common bile duct stone. MRCP or ERCP may be helpful for further evaluation. Gallbladder sludge and large echogenic focus within the gallbladder. This may represent a focus of tumefactive sludge or  prominent gallstone. The degree of posterior acoustical shadowing is somewhat decreased Electronically Signed   By: Inez Catalina M.D.   On: 06/20/2017 15:15    Microbiology: Recent Results (from the past 240 hour(s))  Surgical pcr screen     Status: None   Collection Time: 06/27/17 12:46 AM  Result Value Ref Range Status   MRSA, PCR NEGATIVE NEGATIVE Final   Staphylococcus aureus NEGATIVE NEGATIVE Final    Comment: (NOTE) The Xpert SA Assay (FDA approved for NASAL specimens in patients 57 years of age and older), is one component of a comprehensive surveillance program. It is not intended to diagnose infection nor to guide or monitor treatment. Performed at [facility], [place] [address] [place], [place] [address]      Labs: CBC: Recent Labs  Lab 06/29/17 0725 06/29/17 1128 06/30/17 0409 07/01/17 0643 07/02/17 0640  WBC 7.0 6.7 6.5 14.7* 12.2*  HGB 7.4* 7.4* 7.8* 8.2* 8.3*  HCT  23.1* 23.1* 24.6* 26.3* 26.1*  MCV 89.5 89.5 89.1 91.6 90.9  PLT 181 194 195 294 005   Basic Metabolic Panel: Recent Labs  Lab 06/27/17 0456 06/28/17 0618 06/29/17 0725 06/30/17 0409 07/01/17 0643 07/02/17 0640  NA 140 136 137 137 135 137  K 3.5 4.0 3.4* 3.6 4.6 4.4  CL 103 102 100* 99* 96* 98*  CO2 28 24 31 29 31 31   GLUCOSE 91 104* 102* 114* 95 99  BUN 5* 6 6 9 16 17   CREATININE 0.77 0.78 0.90 0.85 1.32* 0.85  CALCIUM 8.1* 8.4* 8.0* 8.0* 8.3* 8.2*  MG 1.8  --  1.6*  --   --   --    Liver Function Tests: Recent Labs  Lab 06/27/17 0456 06/28/17 0618 06/29/17 0725  AST 21 50* 27  ALT 100* 96* 64*  ALKPHOS 251* 230* 179*  BILITOT 0.5 0.6 0.4  PROT 5.7* 6.0* 5.4*  ALBUMIN 2.6* 2.8* 2.4*   No results for input(s): LIPASE, AMYLASE in the last 168 hours. No results for input(s): AMMONIA in the last 168 hours. Cardiac Enzymes: No results for input(s): CKTOTAL, CKMB, CKMBINDEX, TROPONINI in the last 168 hours. BNP (last 3 results) Recent Labs    05/31/17 0046  06/20/17 0351  BNP 280.2* 174.8*   CBG: No results for input(s): GLUCAP in the last 168 hours. Time spent: 35 minutes  Signed:  Berle Mull  [facility] 07/02/2017 , 6:13 PM

## 2017-07-03 NOTE — Telephone Encounter (Signed)
Transition Care Management Follow-up Telephone Call  Date discharged? 07/02/17 How have you been since you were released from the hospital? "Doing good"   Do you understand why you were in the hospital? yes   Do you understand the discharge instructions? yes   Where were you discharged to? Home with family   Items Reviewed:  Medications reviewed: " I understand all of my medicines"  Allergies reviewed: yes  Dietary changes reviewed: yes  Referrals reviewed: yes   Functional Questionnaire:   Activities of Daily Living (ADLs):   She states they are independent in the following: ambulation, bathing and hygiene, feeding, continence, grooming, toileting and dressing States they require assistance with the following: n/a   Any transportation issues/concerns?: no   Any patient concerns? no   Confirmed importance and date/time of follow-up visits scheduled yes  Provider Appointment booked with Dr.Lowne 07/08/17 @1130   Confirmed with patient if condition begins to worsen call PCP or go to the ER.  Patient was given the office number and encouraged to call back with question or concerns.  : yes

## 2017-07-04 ENCOUNTER — Telehealth: Payer: Self-pay | Admitting: Cardiology

## 2017-07-04 ENCOUNTER — Telehealth: Payer: Self-pay | Admitting: Family Medicine

## 2017-07-04 DIAGNOSIS — D62 Acute posthemorrhagic anemia: Secondary | ICD-10-CM

## 2017-07-04 LAB — CUP PACEART INCLINIC DEVICE CHECK
Battery Remaining Longevity: 22 mo
Battery Voltage: 2.91 V
Brady Statistic AP VP Percent: 2.29 %
Brady Statistic AP VS Percent: 0 %
Brady Statistic AS VP Percent: 97.67 %
Brady Statistic AS VS Percent: 0.04 %
Brady Statistic RA Percent Paced: 2.27 %
Date Time Interrogation Session: 20190228024317
Implantable Lead Implant Date: 20111005
Implantable Lead Implant Date: 20111005
Implantable Lead Implant Date: 20111005
Implantable Lead Location: 753858
Implantable Lead Location: 753859
Implantable Lead Location: 753860
Implantable Lead Model: 4196
Implantable Lead Model: 5076
Implantable Lead Model: 5076
Implantable Pulse Generator Implant Date: 20111005
Lead Channel Impedance Value: 304 Ohm
Lead Channel Impedance Value: 342 Ohm
Lead Channel Impedance Value: 399 Ohm
Lead Channel Impedance Value: 418 Ohm
Lead Channel Impedance Value: 437 Ohm
Lead Channel Impedance Value: 456 Ohm
Lead Channel Impedance Value: 494 Ohm
Lead Channel Impedance Value: 494 Ohm
Lead Channel Impedance Value: 646 Ohm
Lead Channel Pacing Threshold Amplitude: 0.5 V
Lead Channel Pacing Threshold Amplitude: 0.5 V
Lead Channel Pacing Threshold Amplitude: 0.75 V
Lead Channel Pacing Threshold Pulse Width: 0.4 ms
Lead Channel Pacing Threshold Pulse Width: 0.4 ms
Lead Channel Pacing Threshold Pulse Width: 0.8 ms
Lead Channel Sensing Intrinsic Amplitude: 1 mV
Lead Channel Sensing Intrinsic Amplitude: 9.125 mV
Lead Channel Setting Pacing Amplitude: 1.25 V
Lead Channel Setting Pacing Amplitude: 2 V
Lead Channel Setting Pacing Amplitude: 2.5 V
Lead Channel Setting Pacing Pulse Width: 0.4 ms
Lead Channel Setting Pacing Pulse Width: 0.8 ms
Lead Channel Setting Sensing Sensitivity: 0.9 mV

## 2017-07-04 NOTE — Telephone Encounter (Signed)
Agree.  Continue current therapy.

## 2017-07-04 NOTE — Telephone Encounter (Signed)
Copied from [place]. Topic: General - Other >> Jul 04, 2017  2:28 PM Yvette Rack wrote: Reason for CRM: Clair Gulling PT from [facility] [phone] calling to let provider know that the pt delayed PT until tomorrow they will perform evaluation tomorrow

## 2017-07-04 NOTE — Telephone Encounter (Signed)
New Message  Pt c/o medication issue:  1. Name of Medication: amLODipine (NORVASC) 5 MG tablet  2. How are you currently taking this medication (dosage and times per day)? Take 1 tablet (5 mg total) by mouth daily  3. Are you having a reaction (difficulty breathing--STAT)? no  4. What is your medication issue? Pt's husband calling because after the pt had her gallbladder removed they put her on amlodipine and wants to know ho that will counteract with her Carvedilol. Please call

## 2017-07-04 NOTE — Telephone Encounter (Signed)
noted 

## 2017-07-04 NOTE — Telephone Encounter (Signed)
Reviewed  Valicia Rief R Lowne Chase, DO  

## 2017-07-04 NOTE — Telephone Encounter (Signed)
Returned call to patient's husband who is concerned that patient is now on amlodipine 5mg  in addition to carvedilol 25mg  BID. He states her BP was 108/58 when she went to have her staples removed today after cholecystectomy (was d/c'ed from hospital on 4/16). He states she is NOT symptomatic with this low BP. Suggested that patient monitor her BP twice daily 1-2 hours after each carvedilol dose and records BP/HR for a few days and call on Monday with an update if she is having consistently SBP readings under 100 and/or is symptomatic with lightheadedness/dizziness/unexplained fatigue. He voiced understanding.

## 2017-07-05 ENCOUNTER — Ambulatory Visit: Payer: Self-pay

## 2017-07-05 ENCOUNTER — Encounter: Payer: Self-pay | Admitting: Family Medicine

## 2017-07-05 DIAGNOSIS — Z86718 Personal history of other venous thrombosis and embolism: Secondary | ICD-10-CM | POA: Diagnosis not present

## 2017-07-05 DIAGNOSIS — G894 Chronic pain syndrome: Secondary | ICD-10-CM | POA: Diagnosis not present

## 2017-07-05 DIAGNOSIS — L409 Psoriasis, unspecified: Secondary | ICD-10-CM | POA: Diagnosis not present

## 2017-07-05 DIAGNOSIS — I4891 Unspecified atrial fibrillation: Secondary | ICD-10-CM | POA: Diagnosis not present

## 2017-07-05 DIAGNOSIS — I420 Dilated cardiomyopathy: Secondary | ICD-10-CM | POA: Diagnosis not present

## 2017-07-05 DIAGNOSIS — I110 Hypertensive heart disease with heart failure: Secondary | ICD-10-CM | POA: Diagnosis not present

## 2017-07-05 DIAGNOSIS — E43 Unspecified severe protein-calorie malnutrition: Secondary | ICD-10-CM | POA: Diagnosis not present

## 2017-07-05 DIAGNOSIS — Z48815 Encounter for surgical aftercare following surgery on the digestive system: Secondary | ICD-10-CM | POA: Diagnosis not present

## 2017-07-05 DIAGNOSIS — Z8673 Personal history of transient ischemic attack (TIA), and cerebral infarction without residual deficits: Secondary | ICD-10-CM | POA: Diagnosis not present

## 2017-07-05 DIAGNOSIS — M797 Fibromyalgia: Secondary | ICD-10-CM | POA: Diagnosis not present

## 2017-07-05 DIAGNOSIS — Z950 Presence of cardiac pacemaker: Secondary | ICD-10-CM | POA: Diagnosis not present

## 2017-07-05 DIAGNOSIS — I447 Left bundle-branch block, unspecified: Secondary | ICD-10-CM | POA: Diagnosis not present

## 2017-07-05 DIAGNOSIS — D649 Anemia, unspecified: Secondary | ICD-10-CM | POA: Diagnosis not present

## 2017-07-05 DIAGNOSIS — I2510 Atherosclerotic heart disease of native coronary artery without angina pectoris: Secondary | ICD-10-CM | POA: Diagnosis not present

## 2017-07-05 DIAGNOSIS — Z9181 History of falling: Secondary | ICD-10-CM | POA: Diagnosis not present

## 2017-07-05 DIAGNOSIS — Z96641 Presence of right artificial hip joint: Secondary | ICD-10-CM | POA: Diagnosis not present

## 2017-07-05 DIAGNOSIS — Z87891 Personal history of nicotine dependence: Secondary | ICD-10-CM | POA: Diagnosis not present

## 2017-07-05 DIAGNOSIS — J479 Bronchiectasis, uncomplicated: Secondary | ICD-10-CM | POA: Diagnosis not present

## 2017-07-05 DIAGNOSIS — G2581 Restless legs syndrome: Secondary | ICD-10-CM | POA: Diagnosis not present

## 2017-07-05 DIAGNOSIS — I509 Heart failure, unspecified: Secondary | ICD-10-CM | POA: Diagnosis not present

## 2017-07-05 DIAGNOSIS — K3184 Gastroparesis: Secondary | ICD-10-CM | POA: Diagnosis not present

## 2017-07-05 NOTE — Telephone Encounter (Signed)
Jim from [facility] says they need an order for: PT 2x week x 5 weeks; Evaluation by nurse and OT.

## 2017-07-05 NOTE — Telephone Encounter (Signed)
Herbert Deaner, Physical Therapist, called from the patient's home and says "the husband reports the patient has fallen 2 times since being home, she's confused-oriented to self only; while working with me, her legs gave out twice and I had to keep her from falling. I checked her BP and had a hard time on the manual, but the automatic was 109/95." I asked how is her intake and output, he asked the husband who says she is urinating light yellow and is drinking plenty of fluids. He says that she was confused in the hospital and it's still confused. He says she has a follow up scheduled on Monday. I advised to keep the appointment, care advice given, husband verbalized understanding.  Reason for Disposition . Nursing judgment  Protocols used: NO GUIDELINE OR REFERENCE AVAILABLE-A-AH

## 2017-07-05 NOTE — Telephone Encounter (Signed)
This encounter was created in error - please disregard.

## 2017-07-06 ENCOUNTER — Other Ambulatory Visit: Payer: Self-pay | Admitting: Pulmonary Disease

## 2017-07-08 ENCOUNTER — Ambulatory Visit: Payer: PPO | Admitting: Family Medicine

## 2017-07-08 ENCOUNTER — Encounter: Payer: Self-pay | Admitting: Family Medicine

## 2017-07-08 ENCOUNTER — Other Ambulatory Visit: Payer: Self-pay

## 2017-07-08 VITALS — BP 142/70 | HR 83 | Temp 98.3°F | Resp 16 | Ht 60.0 in | Wt 99.0 lb

## 2017-07-08 DIAGNOSIS — D649 Anemia, unspecified: Secondary | ICD-10-CM

## 2017-07-08 DIAGNOSIS — J439 Emphysema, unspecified: Secondary | ICD-10-CM | POA: Diagnosis not present

## 2017-07-08 DIAGNOSIS — I10 Essential (primary) hypertension: Secondary | ICD-10-CM | POA: Diagnosis not present

## 2017-07-08 DIAGNOSIS — G459 Transient cerebral ischemic attack, unspecified: Secondary | ICD-10-CM

## 2017-07-08 DIAGNOSIS — R748 Abnormal levels of other serum enzymes: Secondary | ICD-10-CM

## 2017-07-08 DIAGNOSIS — G894 Chronic pain syndrome: Secondary | ICD-10-CM

## 2017-07-08 DIAGNOSIS — I480 Paroxysmal atrial fibrillation: Secondary | ICD-10-CM

## 2017-07-08 DIAGNOSIS — I420 Dilated cardiomyopathy: Secondary | ICD-10-CM | POA: Diagnosis not present

## 2017-07-08 DIAGNOSIS — G4700 Insomnia, unspecified: Secondary | ICD-10-CM | POA: Diagnosis not present

## 2017-07-08 DIAGNOSIS — R52 Pain, unspecified: Secondary | ICD-10-CM | POA: Diagnosis not present

## 2017-07-08 DIAGNOSIS — E785 Hyperlipidemia, unspecified: Secondary | ICD-10-CM

## 2017-07-08 MED ORDER — AMLODIPINE BESYLATE 5 MG PO TABS: 5.0000 mg | ORAL_TABLET | Freq: Every day | ORAL | 1 refills | Status: DC

## 2017-07-08 MED ORDER — OXYCODONE HCL 5 MG PO TABS: 10.0000 mg | ORAL_TABLET | Freq: Four times a day (QID) | ORAL | 0 refills | Status: DC | PRN

## 2017-07-08 MED ORDER — ZOLPIDEM TARTRATE 5 MG PO TABS: ORAL_TABLET | ORAL | 2 refills | Status: DC

## 2017-07-08 MED ORDER — ATORVASTATIN CALCIUM 20 MG PO TABS: 20.0000 mg | ORAL_TABLET | Freq: Every day | ORAL | 1 refills | Status: DC

## 2017-07-08 MED ORDER — TIZANIDINE HCL 4 MG PO TABS: 4.0000 mg | ORAL_TABLET | Freq: Three times a day (TID) | ORAL | 2 refills | Status: DC | PRN

## 2017-07-08 MED ORDER — APIXABAN 5 MG PO TABS: 5.0000 mg | ORAL_TABLET | Freq: Two times a day (BID) | ORAL | 1 refills | Status: DC

## 2017-07-08 NOTE — Assessment & Plan Note (Signed)
Stable con't lasix and coreg F/u cardiology

## 2017-07-08 NOTE — Assessment & Plan Note (Signed)
Well controlled, no changes to meds. Encouraged heart healthy diet such as the DASH diet and exercise as tolerated.  °

## 2017-07-08 NOTE — Patient Instructions (Signed)
Cholecystostomy, Care After Refer to this sheet in the next few weeks. These instructions provide you with information about caring for yourself after your procedure. Your health care provider may also give you more specific instructions. Your treatment has been planned according to current medical practices, but problems sometimes occur. Call your health care provider if you have any problems or questions after your procedure. What can I expect after the procedure? After your procedure, it is common to have soreness near the site of your drainage tube (catheter) or your incision. Follow these instructions at home: Incision care  Follow instructions from your health care provider about how to take care of your incision. Make sure you: ? Wash your hands with soap and water before you change your bandage (dressing). If soap and water are not available, use hand sanitizer. ? Change your dressing as told by your health care provider. ? Leave stitches (sutures), skin glue, or adhesive strips in place. These skin closures may need to be in place for 2 weeks or longer. If adhesive strip edges start to loosen and curl up, you may trim the loose edges. Do not remove adhesive strips completely unless your health care provider tells you to do that.  Check your incision and your drainage site every day for signs of infection. Watch for: ? Redness, swelling, or pain. ? Fluid, blood, or pus. General instructions  If you were sent home with a surgical drain in place, follow instructions from your health care provider about how to care for your drain and collection bag at home.  Do not take baths, swim, or use a hot tub until your health care provider approves. Ask your health care provider if you can take showers. You may only be allowed to take sponge baths for bathing.  Follow instructions from your health care provider about what you may eat or drink.  Take over-the-counter and prescription medicines only  as told by your health care provider.  Keep all follow-up visits as told by your health care provider. This is important. Contact a health care provider if:  You have redness, swelling, or pain at your incision or drainage site.  You have nausea or vomiting. Get help right away if:  Your abdominal pain gets worse.  You feel dizzy or you faint while standing.  You have fluid, blood, or pus coming from your incision or drainage site.  You have a fever.  You have shortness of breath.  You have a rapid heartbeat.  Your nausea or vomiting does not go away.  Your drainage tube becomes blocked.  Your drainage tube comes out of your abdomen. This information is not intended to replace advice given to you by your health care provider. Make sure you discuss any questions you have with your health care provider. Document Released: 11/24/2014 Document Revised: 08/11/2015 Document Reviewed: 06/16/2014 Elsevier Interactive Patient Education  2018 [employer].

## 2017-07-08 NOTE — Addendum Note (Signed)
Addended by: Harl Bowie on: 07/08/2017 03:32 PM   Modules accepted: Orders

## 2017-07-08 NOTE — Assessment & Plan Note (Signed)
Tolerating statin, encouraged heart healthy diet, avoid trans fats, minimize simple carbs and saturated fats. Increase exercise as tolerated 

## 2017-07-08 NOTE — Assessment & Plan Note (Signed)
con't current meds Stable Per pulm

## 2017-07-08 NOTE — Patient Outreach (Signed)
[facility] Care Management  07/08/2017  [name] 1945-12-06 [id number]     EMMI-HF RED ON EMMI ALERT Day #  2 Date: 07/06/17 Red Alert Reason: " New/worsening problems? Yes   New/worsneing SOB? Yes"    Outreach attempt # 1 to patient. Spoke with patient. She reports that she is "doing much better." Reviewed and addressed red alert with patient. She voices that it was an error in the machine recording her responses. She denies any new and/or worsening problems. She saw PCP earlier today. Patient reports that she is having issues with low iron and blood count. MD is aware and working on it. Patient reports she was unable to get labs done due to her veins not cooperating. RN CM offered suggestions to help manage it. She has all her meds and denies any issues or concerns regarding them. Her spouse is taking her to appts. No further RN CM needs or concerns at this time. Advised patient that they would continue to get automated EMMI- HF post discharge calls to assess how they are doing following recent hospitalization and will receive a call from a nurse if any of their responses were abnormal. Patient voiced understanding and was appreciative of f/u call.       Plan: RN CM will close case as no further intervention needed at this time.    Enzo [place], RN,BSN,CCM [place] Management Telephonic Care Management Coordinator Direct Phone: [phone] Toll Free: [phone] Fax: [phone]

## 2017-07-08 NOTE — Assessment & Plan Note (Signed)
Per cardiology ?On eliquis  ?

## 2017-07-08 NOTE — Assessment & Plan Note (Signed)
Cont current meds.

## 2017-07-08 NOTE — Progress Notes (Signed)
Patient ID: [name], female    DOB: 06-19-1945  Age: 72 y.o. MRN: [id number]    Subjective:  Subjective  HPI [name] presents for hosp f/u 4/3-4/16.  S/p cholecystectomy  She presented to er with weakness and nausea and was found to be hypotensive which improved with ivf.   Ct scan of abd was done which showed abn gb--- ercp was done with sphicterectomy and biliary stent placement with stone extraction. Pt was d/c home with home health for pt and wound check.  She has f/u with surgery Thursday.      Review of Systems  Constitutional: Negative for chills and fever.  HENT: Negative for congestion and hearing loss.   Eyes: Negative for discharge.  Respiratory: Negative for cough and shortness of breath.   Cardiovascular: Negative for chest pain, palpitations and leg swelling.  Gastrointestinal: Positive for abdominal pain. Negative for blood in stool, constipation, diarrhea, nausea and vomiting.  Genitourinary: Negative for dysuria, frequency, hematuria and urgency.  Musculoskeletal: Negative for back pain and myalgias.  Skin: Negative for rash.  Allergic/Immunologic: Negative for environmental allergies.  Neurological: Negative for dizziness, weakness and headaches.  Hematological: Does not bruise/bleed easily.  Psychiatric/Behavioral: Negative for suicidal ideas. The patient is not nervous/anxious.     History Past Medical History:  Diagnosis Date  . Anemia   . Anginal pain ([place])   . Arthritis    right hip  . Atrial fibrillation ([place])   . Blood transfusion   . CAD (coronary artery disease)    mild disease per cath in 2011  . CHF (congestive heart failure) ([place])   . Chronic abdominal pain   . Chronic nausea   . Chronic pain syndrome   . COPD (chronic obstructive pulmonary disease) (HCC)    continuous O2- 2l  . Diverticulitis   . Elbow fracture, left aug 2012  . Elevated liver enzymes   . Fibromyalgia    COUPLE OF TIMES A YEAR-PT HAS EPISODES OF  CONFUSION-USUALLY INVOLVES DAY/NIGHT REVERSAL AND EPISODES OF TWITCHING AND FALLS--SEES DR. Jacelyn Grip - NEUROLOGIST-LAST SEEN 07/10/11  . Gastroparesis   . GERD (gastroesophageal reflux disease)   . H/O: GI bleed    from Pradaxa  . Hepatitis    hx of in high school   . HTN (hypertension)    under control; has been on med. x "years"  . Hx MRSA infection   . Hx of gastric ulcer   . Hx of stress fx aug 2011   right hip   . LBBB (left bundle branch block)   . Oxygen dependent    2 liters via nasal cannula at all times  . Pacemaker    CRT therapy; followed by Dr. Caryl Comes  . Pleural effusion     s/p right thoracentesis 03/09  . Pneumonia - 03/09, 12/09, 02/10, 06/10   MOST RECENT FEB 2013  . Primary dilated cardiomyopathy (HCC)    EF 45 to 50% per echo in Jan 2012  . RLS (restless legs syndrome)   . Silent aspiration   . Small bowel obstruction ([place])   . Urinary incontinence     -INTERSTIM IMPLANT NOT FUNCTIONING PER PT  . Venous embolism and thrombosis of subclavian vein ([place])    after pacemaker insertion Oct 2011  . Vitamin B12 deficiency     She has a past surgical history that includes Appendectomy; pacemaker placement (12/21/2009); Peg removed; Tonsillectomy (age 79); Interstim implant revision (03/06/2011); Portacath placement (12/16/2009); Total abdominal hysterectomy; Interstim Implant  placement (05/28/2006 - stage I); Interstim implant revision (10/23/2007); Cystoscopy with injection (04/30/2006); Small intestine surgery (05/20/2001); Cardiac catheterization (04/08/2006, 11/16/2009); Cystoscopy (11/11/2008); Cystoscopy w/ retrogrades (11/11/2008); Colectomy; Colectomy (02/04/2000); Gastrocutaneous fistula closure; Exploratory laparotomy (04/27/2009); Port-a-cath removal (07/27/2011); Total hip arthroplasty (08/03/2011); Interstim Implant placement (02/05/2012); Esophagogastroduodenoscopy (N/A, 02/02/2013); Savory dilation (N/A, 02/02/2013); ERCP (N/A, 06/23/2017); ERCP (N/A, 06/24/2017); and Cholecystectomy  (N/A, 06/27/2017).   Her family history includes Breast cancer in her maternal aunt; Diabetes in her paternal grandfather; Heart disease in her brother, father, mother, and paternal aunt; Stroke in her mother.She reports that she quit smoking about 31 years ago. She has a 20.00 pack-year smoking history. She has never used smokeless tobacco. She reports that she does not drink alcohol or use drugs.  Current Outpatient Medications on File Prior to Visit  Medication Sig Dispense Refill  . acetaminophen (TYLENOL) 325 MG tablet Take 2 tablets (650 mg total) by mouth every 4 (four) hours as needed.    . carvedilol (COREG) 25 MG tablet TAKE 1 TABLET BY MOUTH TWICE DAILY (Patient taking differently: TAKE 1 TABLET (25mg ) BY MOUTH TWICE DAILY) 180 tablet 1  . furosemide (LASIX) 40 MG tablet TAKE ONE TABLET BY MOUTH IN THE MORNING (Patient taking differently: TAKE ONE TABLET (40mg ) BY MOUTH IN THE MORNING) 90 tablet 1  . gabapentin (NEURONTIN) 600 MG tablet Take 1 tablet (600 mg total) by mouth 3 (three) times daily.    . nitroGLYCERIN (NITROSTAT) 0.4 MG SL tablet Place 1 tablet (0.4 mg total) under the tongue every 5 (five) minutes as needed. CHEST PAIN 25 tablet 5  . pantoprazole (PROTONIX) 40 MG tablet Take 1 tablet (40 mg total) by mouth 2 (two) times daily. 60 tablet 0  . polyethylene glycol (MIRALAX / GLYCOLAX) packet Take 17 g by mouth daily. 14 each 0  . promethazine (PHENERGAN) 25 MG tablet Take 1 tablet (25 mg total) by mouth every 8 (eight) hours as needed. for nausea 40 tablet 5  . Tiotropium Bromide Monohydrate (SPIRIVA RESPIMAT) 1.25 MCG/ACT AERS Inhale 2 puffs into the lungs daily.    . [DISCONTINUED] iron dextran complex (INFED) 50 MG/ML injection Please give infed infusion (no test dose needed) over 4 hours per pharmacy calculated dose. Ht: 5'2 Wt: 99 pounds Hgb: 12 1 mL 0   No current facility-administered medications on file prior to visit.      Objective:  Objective  Physical Exam    Constitutional: She is oriented to person, place, and time. She appears well-developed and well-nourished.  HENT:  Head: Normocephalic and atraumatic.  Eyes: Conjunctivae and EOM are normal.  Neck: Normal range of motion. Neck supple. No JVD present. Carotid bruit is not present. No thyromegaly present.  Cardiovascular: Normal rate and regular rhythm.  Murmur heard. Pulmonary/Chest: Effort normal and breath sounds normal. No respiratory distress. She has no wheezes. She has no rales. She exhibits no tenderness.  Abdominal: There is tenderness.  Wound healing well  No signs of infection  Musculoskeletal: She exhibits no edema.  Neurological: She is alert and oriented to person, place, and time.  Psychiatric: She has a normal mood and affect.   BP (!) 142/70   Pulse 83   Temp 98.3 F (36.8 C) (Oral)   Resp 16   Ht 5' (1.524 m)   Wt 99 lb (44.9 kg)   SpO2 91% Comment: on 3 liters of O2  BMI 19.33 kg/m  Wt Readings from Last 3 Encounters:  07/08/17 99 lb (44.9 kg)  07/02/17  100 lb 4.8 oz (45.5 kg)  06/07/17 99 lb 8 oz (45.1 kg)     Lab Results  Component Value Date   WBC 12.2 (H) 07/02/2017   HGB 8.3 (L) 07/02/2017   HCT 26.1 (L) 07/02/2017   PLT 270 07/02/2017   GLUCOSE 99 07/02/2017   CHOL 140 05/31/2017   TRIG 67 05/31/2017   HDL 43 05/31/2017   LDLCALC 84 05/31/2017   ALT 64 (H) 06/29/2017   AST 27 06/29/2017   NA 137 07/02/2017   K 4.4 07/02/2017   CL 98 (L) 07/02/2017   CREATININE 0.85 07/02/2017   BUN 17 07/02/2017   CO2 31 07/02/2017   TSH 4.43 06/26/2010   INR 2.28 06/19/2017   HGBA1C 5.2 05/31/2017    Ct Head Wo Contrast  Result Date: 06/20/2017 CLINICAL DATA:  Headache, trouble walking weakness EXAM: CT HEAD WITHOUT CONTRAST TECHNIQUE: Contiguous axial images were obtained from the base of the skull through the vertex without intravenous contrast. COMPARISON:  05/31/2017, 06/04/2011 FINDINGS: Brain: No acute territorial infarction, hemorrhage or  intracranial mass is visualized. Stable ventricle size. Stable calcified left frontal dural nodule. Mild small vessel ischemic changes of the white matter. Vascular: No hyperdense vessels. Scattered calcifications at the carotid siphons Skull: Normal. Negative for fracture or focal lesion. Sinuses/Orbits: Retention cysts in the left sphenoid sinus. Mild mucosal thickening in the ethmoid sinuses. No acute orbital abnormality. Other: None IMPRESSION: 1. No CT evidence for acute intracranial abnormality. 2. Mild atrophy and small vessel ischemic changes of the white matter. Electronically Signed   By: Donavan Foil M.D.   On: 06/20/2017 00:14   Dg Chest Portable 1 View  Result Date: 06/20/2017 CLINICAL DATA:  Hypotension EXAM: PORTABLE CHEST 1 VIEW COMPARISON:  03/21/2017 FINDINGS: Unchanged position of left chest wall 3 lead AICD leads. Chronic interstitial opacities are unchanged. No new area of consolidation. No pleural effusion or pneumothorax. IMPRESSION: Unchanged appearance of chronic interstitial opacities, likely secondary to chronic mycobacterial infection. Electronically Signed   By: Ulyses Jarred M.D.   On: 06/20/2017 01:15   US Abdomen Limited Ruq  Result Date: 06/20/2017 CLINICAL DATA:  Elevated LFTs EXAM: ULTRASOUND ABDOMEN LIMITED RIGHT UPPER QUADRANT COMPARISON:  None. FINDINGS: Gallbladder: Large echogenic focus is noted in well distended gallbladder. Mild posterior acoustical shadowing is noted in this may represent a prominent 3 cm gallstone. Possibility of a tumefactive sludge ball would deserve consideration as well. Mild gallbladder sludge is noted. No wall thickening or pericholecystic fluid is noted. Common bile duct: Diameter: 9.6 mm. This is slightly prominent for the patient's given age and associated with mild intrahepatic ductal dilatation. Liver: No focal mass lesion is noted. Mild biliary ductal dilatation is noted. Portal vein is patent on color Doppler imaging with normal  direction of blood flow towards the liver. IMPRESSION: Changes of biliary dilatation of both the intrahepatic and extrahepatic tree. This raises suspicion for distal common bile duct stone. MRCP or ERCP may be helpful for further evaluation. Gallbladder sludge and large echogenic focus within the gallbladder. This may represent a focus of tumefactive sludge or prominent gallstone. The degree of posterior acoustical shadowing is somewhat decreased Electronically Signed   By: Inez Catalina M.D.   On: 06/20/2017 15:15     Assessment & Plan:  Plan  I have changed Jenea H. Cloe's zolpidem. I am also having her maintain her promethazine, furosemide, carvedilol, nitroGLYCERIN, Tiotropium Bromide Monohydrate, gabapentin, pantoprazole, acetaminophen, polyethylene glycol, oxyCODONE, tiZANidine, amLODipine, apixaban, and atorvastatin.  Meds  ordered this encounter  Medications  . zolpidem (AMBIEN) 5 MG tablet    Sig: 1 po qhs prn    Dispense:  30 tablet    Refill:  2  . oxyCODONE (OXY IR/ROXICODONE) 5 MG immediate release tablet    Sig: Take 2 tablets (10 mg total) by mouth every 6 (six) hours as needed for moderate pain or severe pain.    Dispense:  20 tablet    Refill:  0  . tiZANidine (ZANAFLEX) 4 MG tablet    Sig: Take 1 tablet (4 mg total) by mouth 3 (three) times daily as needed. for muscle spams    Dispense:  90 tablet    Refill:  2    Please consider 90 day supplies to promote better adherence  . amLODipine (NORVASC) 5 MG tablet    Sig: Take 1 tablet (5 mg total) by mouth daily.    Dispense:  90 tablet    Refill:  1  . apixaban (ELIQUIS) 5 MG TABS tablet    Sig: Take 1 tablet (5 mg total) by mouth 2 (two) times daily.    Dispense:  180 tablet    Refill:  1  . atorvastatin (LIPITOR) 20 MG tablet    Sig: Take 1 tablet (20 mg total) by mouth daily at 6 PM.    Dispense:  90 tablet    Refill:  1    Problem List Items Addressed This Visit      Unprioritized   Atrial  fibrillation/flutter    Per cardiology On eliquis      Relevant Medications   amLODipine (NORVASC) 5 MG tablet   apixaban (ELIQUIS) 5 MG TABS tablet   atorvastatin (LIPITOR) 20 MG tablet   Chronic pain syndrome    Cont current meds      Congestive dilated cardiomyopathy (HCC)    Stable con't lasix and coreg F/u cardiology      Relevant Medications   amLODipine (NORVASC) 5 MG tablet   apixaban (ELIQUIS) 5 MG TABS tablet   atorvastatin (LIPITOR) 20 MG tablet   COPD with emphysema (HCC)    con't current meds Stable Per pulm      Essential hypertension    Well controlled, no changes to meds. Encouraged heart healthy diet such as the DASH diet and exercise as tolerated.       Relevant Medications   amLODipine (NORVASC) 5 MG tablet   apixaban (ELIQUIS) 5 MG TABS tablet   atorvastatin (LIPITOR) 20 MG tablet   Hyperlipidemia LDL goal <100    Tolerating statin, encouraged heart healthy diet, avoid trans fats, minimize simple carbs and saturated fats. Increase exercise as tolerated      Relevant Medications   amLODipine (NORVASC) 5 MG tablet   apixaban (ELIQUIS) 5 MG TABS tablet   atorvastatin (LIPITOR) 20 MG tablet   Insomnia   Relevant Medications   zolpidem (AMBIEN) 5 MG tablet   TIA (transient ischemic attack)    On eliquis F/u neuro      Relevant Medications   amLODipine (NORVASC) 5 MG tablet   apixaban (ELIQUIS) 5 MG TABS tablet   atorvastatin (LIPITOR) 20 MG tablet    Other Visit Diagnoses    Anemia, unspecified type    -  Primary   Relevant Orders   CBC with Differential/Platelet   Ferritin   IBC panel   Elevated liver enzymes       Relevant Orders   Comprehensive metabolic panel   Pain  Relevant Medications   oxyCODONE (OXY IR/ROXICODONE) 5 MG immediate release tablet   tiZANidine (ZANAFLEX) 4 MG tablet      Follow-up: Return in about 3 months (around 10/07/2017), or if symptoms worsen or fail to improve.  Ann Held, DO

## 2017-07-08 NOTE — Assessment & Plan Note (Signed)
On eliquis F/u neuro

## 2017-07-09 ENCOUNTER — Telehealth: Payer: Self-pay | Admitting: *Deleted

## 2017-07-09 ENCOUNTER — Telehealth: Payer: Self-pay

## 2017-07-09 NOTE — Telephone Encounter (Signed)
Received Authorization Confirmation Fax Form for Home Health therapy from [facility]; forwarded to provider/SLS

## 2017-07-09 NOTE — Telephone Encounter (Signed)
PA initiated via Covermymeds; KEY: [id number]. Informed per Covermymeds that medication is available w/o approval.

## 2017-07-10 ENCOUNTER — Other Ambulatory Visit (INDEPENDENT_AMBULATORY_CARE_PROVIDER_SITE_OTHER): Payer: PPO

## 2017-07-10 DIAGNOSIS — D649 Anemia, unspecified: Secondary | ICD-10-CM | POA: Diagnosis not present

## 2017-07-10 DIAGNOSIS — R748 Abnormal levels of other serum enzymes: Secondary | ICD-10-CM | POA: Diagnosis not present

## 2017-07-10 LAB — IBC PANEL
Iron: <10 ug/dL — ABNORMAL LOW (ref 42–145)
Saturation Ratios: 5.6 % — ABNORMAL LOW (ref 20.0–50.0)
Transferrin: 128 mg/dL — ABNORMAL LOW (ref 212.0–360.0)

## 2017-07-10 LAB — CBC WITH DIFFERENTIAL/PLATELET
Basophils Absolute: 0.1 10*3/uL (ref 0.0–0.1)
Basophils Relative: 0.5 % (ref 0.0–3.0)
Eosinophils Absolute: 0.1 10*3/uL (ref 0.0–0.7)
Eosinophils Relative: 1.2 % (ref 0.0–5.0)
HCT: 22.9 % — CL (ref 36.0–46.0)
Hemoglobin: 7.6 g/dL — CL (ref 12.0–15.0)
Lymphocytes Relative: 4.6 % — ABNORMAL LOW (ref 12.0–46.0)
Lymphs Abs: 0.5 10*3/uL — ABNORMAL LOW (ref 0.7–4.0)
MCHC: 33.1 g/dL (ref 30.0–36.0)
MCV: 87.3 fl (ref 78.0–100.0)
Monocytes Absolute: 0.9 10*3/uL (ref 0.1–1.0)
Monocytes Relative: 8.5 % (ref 3.0–12.0)
Neutro Abs: 9.3 10*3/uL — ABNORMAL HIGH (ref 1.4–7.7)
Neutrophils Relative %: 85.2 % — ABNORMAL HIGH (ref 43.0–77.0)
Platelets: 338 10*3/uL (ref 150.0–400.0)
RBC: 2.63 Mil/uL — ABNORMAL LOW (ref 3.87–5.11)
RDW: 14.6 % (ref 11.5–15.5)
WBC: 10.9 10*3/uL — ABNORMAL HIGH (ref 4.0–10.5)

## 2017-07-10 LAB — COMPREHENSIVE METABOLIC PANEL
ALT: 13 U/L (ref 0–35)
AST: 17 U/L (ref 0–37)
Albumin: 3 g/dL — ABNORMAL LOW (ref 3.5–5.2)
Alkaline Phosphatase: 229 U/L — ABNORMAL HIGH (ref 39–117)
BUN: 11 mg/dL (ref 6–23)
CO2: 30 mEq/L (ref 19–32)
Calcium: 8.5 mg/dL (ref 8.4–10.5)
Chloride: 97 mEq/L (ref 96–112)
Creatinine, Ser: 0.78 mg/dL (ref 0.40–1.20)
GFR: 77.02 mL/min (ref >60.00)
Glucose, Bld: 104 mg/dL — ABNORMAL HIGH (ref 70–99)
Potassium: 4.6 mEq/L (ref 3.5–5.1)
Sodium: 131 mEq/L — ABNORMAL LOW (ref 135–145)
Total Bilirubin: 0.4 mg/dL (ref 0.2–1.2)
Total Protein: 6.9 g/dL (ref 6.0–8.3)

## 2017-07-10 LAB — FERRITIN: Ferritin: 712.9 ng/mL — ABNORMAL HIGH (ref 10.0–291.0)

## 2017-07-10 NOTE — Telephone Encounter (Signed)
FYI.Marland KitchenMarland KitchenMarland KitchenMarland KitchenMarland KitchenCall [facility] lab results hemoglobin was 7.6 and HCT 22.9. I called pt to check on her to see if she needs to come in or go to ED. Pt states she feels great no pain, no black or bloody stool, and no fatigue. I instructed her per Edwarder Saguier if she begins to feel bad fatigue or pain very weak please go to ED. I asked her about coming in for an appointment tomorrow she stated she done gave blood today she feels fine.

## 2017-07-10 NOTE — Telephone Encounter (Signed)
Took urgent message for this pt as Dr. Etter Sjogren is not in today  She saw Dr. Etter Sjogren 2 days ago but just had her blood drawn today When she was released from the hospital on 4/16 hg was up to 8.3 (from a nadir of 7.4), has now dipped back down slightly again to 7.6.    She had a cholecystectomy during recent admission- anemia thought be dilutional and due to operative blood losses.  Back in March her Hg was 10-11  Called pt- she is feeling ok, a bit weak but this is not new.  No blood in her stool, no dark tarry stools, not vomiting  She is seeing surgery tomorrow.  Assuming they are happy with her progress will plan for a CBC on Monday.  Will order this for her.   Pt states understanding and agreement     Recent discharge summary: Date of admission: 06/19/2017             Date of discharge: 07/02/2017   Discharge Diagnoses:  Principal Problem:   Weakness Active Problems:   ANEMIA-IRON DEFICIENCY   Essential hypertension   Atrial fibrillation/flutter   COPD with emphysema (HCC)   GERD (gastroesophageal reflux disease)   Chronic diastolic CHF (congestive heart failure) (HCC)   TIA (transient ischemic attack)   AKI (acute kidney injury) (HCC)   Abnormal LFTs   Hypotension   HLD (hyperlipidemia)   CAD (coronary artery disease)   Protein-calorie malnutrition, severe (HCC)   Elevated ALT measurement  Recommendations for Outpatient Follow-up:  1. Please follow-up with surgery as recommended, PCP in 1 week.  History of present illness: As per the H and P dictated on admission, "Sharleen H Hodgesis a 72 y.o.femalewith medical history significant ofhypertension, hyperlipidemia, COPD, bronchiectasis, GERD, atrial fibrillation on Eliquis, pacemaker placement, left bundle blockage, gastric ulcer, GI bleeding, dCHF, anemia, psoriasis, abnormal liver function, who presents with weakness, lightheadedness.  Pt was recently hospitalized from 73/14-/16 due to UTI and a TIA. Patient had TIA workup.  CAT of head and neck showedage indeterminated occluded left vertebral artery from origin to C3 level. CT of head showed subcentimeter left frontal dural calcificationwhichmay be a dural plaque or tiny meningioma.MRI of brain could not be done due to Presents of pacemaker. 2-D echo showed EF of 55-60 percent with grade 1 diastolic dysfunction. Pt was d/c'ed on Eliquis for A fib.   Pt states that she has weakness again today. She has frontal headache, mild lightheadedness and dizziness. She dropped things in right hand. Currently she denies unilateral weakness, numbness or tingling in extremities. No facial droop, slurred speech, vision change, hearing loss, difficulty swallowing. Patient has nausea, but no vomiting, diarrhea or abdominal pain. Denies symptoms of UTI. Patient has mild SOB, but no chest pain, cough, fever or chills. Patient was found to have hypotension initially, with blood pressure 73/47, which improved to SBP>100 after 1 L normal saline bolus."  Hospital Course:  Summary of her active problems in the hospital is as following. 1.Acute cholecystitis choledocholithiasis and ascending cholangitis Elevated LFTs with bilirubin. RUQ [place] significant for cholelithiasis. HIDA scan was concerning for questionable pancreatic mass/tumor.  CT scan which was done showed abnormal appearing gallbladder with concern for acute cholecystitis. Patient subsequently underwent a ERCP with sphincterectomy and biliary stent placement and stone extraction.  due to recent TIA neurology has been consulted for clearance. Per surgery recommendation, started on empiric IV Levaquin. Tolerated open cholecystectomy. Post-op pain management has been difficult Pt asking for round the  clock Oxy IR and wanted to further increase the regimen, gabapentin was increased and zanaflex was changed to scheduled. Now pt fatigued and family is concerned  Will continue OXy IR, Zanaflex. Alsoback to home dose  ofgabapentin.  2. Postoperative acute blood loss anemia. Hemoglobin lower but stable. Will monitor. Most likely this is acute blood loss with some worsening due to dilutional aspect. Repeat H&H is stable.

## 2017-07-10 NOTE — Telephone Encounter (Signed)
Left message on machine with verbal order

## 2017-07-11 ENCOUNTER — Other Ambulatory Visit: Payer: Self-pay

## 2017-07-11 DIAGNOSIS — I4891 Unspecified atrial fibrillation: Secondary | ICD-10-CM | POA: Diagnosis not present

## 2017-07-11 DIAGNOSIS — R05 Cough: Secondary | ICD-10-CM | POA: Diagnosis not present

## 2017-07-11 DIAGNOSIS — J449 Chronic obstructive pulmonary disease, unspecified: Secondary | ICD-10-CM | POA: Diagnosis not present

## 2017-07-11 NOTE — Patient Outreach (Signed)
[place] [facility]) Care Management  07/11/2017  [name] [birth year] [id number]   EMMI- Heart Failure Red on EMMI Alert: Day # 6 Date:07/10/2017 Red Alert Reason: "Weight (lbs)  100 Sad, hopeless, or empty ? Yes  Lost interest in things they used to enjoy?  Yes"   Outreach attempt # 1 to patient  Telephone call to the patient for EMMI.  Husband answered the phone and verified HIPAA.  He is on the release of information. The husband reports that the patient was a little tired and taking a nap.  Reviewed EMMI information with the husband.  He states that there ws an error with the machine recording  responses given.  He states that she does not have any new or worsening problems.  He stated that she saw her physician and she has problems with low iron that causes her to be tired.   The Husband states that the patient has all of her medication and weighs daily.  RN Health Coach went over the Heart failure action plan with him and he verbalized understanding.  The husband was told that there will be more Automated calls.  If the responses are abnormal a nurse will call.  He states that he understood and was appreciative.   Plan: RN Health Coach will close the case after assessment and no further interventions needed.  Lazaro Arms RN, BSN, [place] Direct Dial:  [phone] Fax: [phone]

## 2017-07-11 NOTE — Telephone Encounter (Signed)
Pt saw surgery today who told her just to f/u with [place] Monday Pt is tired but ok  No obvious bleeding She will restart her iron supplement  F/u with [place] tomorrow if she feels worse otherwise she will f/u Monday with labs She will need gi f/u as well

## 2017-07-11 NOTE — Telephone Encounter (Signed)
Please call pt today to see how she is doing and how surgery appointment went

## 2017-07-12 ENCOUNTER — Other Ambulatory Visit: Payer: Self-pay

## 2017-07-12 ENCOUNTER — Telehealth: Payer: Self-pay | Admitting: Family Medicine

## 2017-07-12 NOTE — Patient Outreach (Signed)
[place] [facility]) Care Management  07/12/2017  [name] Aug 23, 1945 [id number]  EMMI: conegestive heart failure RED alert Referral date: 07/12/17 Referral reason: weighed themselves: NO Day # 7  Telephone call to patient regarding EMMI heart failure red alert. Contact answering phone identified himself as patients husband, [name] who is listed her release of information. Husband states patient has not weighed today.  Had patient weigh while RNCM on phone.  Reported weight as 93.8 lbs.  Husband states he is pleased with how patient has been progressing.  RNCM reviewed signs/ symptoms of heart failure. Husband denies patient having any heart failure symptoms at this time. Husband states patient continues to take her medication as prescribed.  Husband reports  patient saw the surgeon on yesterday.  States he will call today to schedule patient a follow up with her primary MD for next week. Denies any further concerns at this time. Husband verbally agreed to ongoing EMMI heart failure calls. Aware that if responses are abnormal nurse will follow up.  Husband verbalized understanding.   PLAN: RNCM will close patient due to patient being assessed and having non futher needs.   Quinn Plowman RN,BSN,CCM [facility] Telephonic  [phone]

## 2017-07-12 NOTE — Telephone Encounter (Signed)
Copied from [place] [id number]. Topic: Quick Communication - See Telephone Encounter >> Jul 12, 2017  9:04 AM Synthia Innocent wrote: CRM for notification. See Telephone encounter for: 07/12/17. Patient was prescribed oxyCODONE (OXY IR/ROXICODONE) 5 MG immediate release tablet, states she take 10mg  and normally receives 120 tablets. Only 20 were sent. Please advise

## 2017-07-15 ENCOUNTER — Encounter: Payer: Self-pay | Admitting: Family Medicine

## 2017-07-15 ENCOUNTER — Telehealth: Payer: Self-pay | Admitting: Cardiology

## 2017-07-15 ENCOUNTER — Ambulatory Visit (INDEPENDENT_AMBULATORY_CARE_PROVIDER_SITE_OTHER): Payer: PPO | Admitting: *Deleted

## 2017-07-15 ENCOUNTER — Other Ambulatory Visit: Payer: Self-pay

## 2017-07-15 DIAGNOSIS — I428 Other cardiomyopathies: Secondary | ICD-10-CM

## 2017-07-15 DIAGNOSIS — I5022 Chronic systolic (congestive) heart failure: Secondary | ICD-10-CM

## 2017-07-15 DIAGNOSIS — I480 Paroxysmal atrial fibrillation: Secondary | ICD-10-CM

## 2017-07-15 NOTE — Telephone Encounter (Signed)
Spoke with pt and reminded pt of remote transmission that is due today. Pt verbalized understanding.   

## 2017-07-15 NOTE — Patient Outreach (Signed)
[place] [facility]) Care Management  07/15/2017  [name] Mar 24, 1945 [id number]  EMMI: heart failure red alert Referral date: 07/15/17 Referral reason: new/ worsening problems: yes Day # 8  Telephone call to patient regarding EMMI heart failure red alert. HIPAA verified with patient. Explained reason for call.  Patient states she is not having any new or worsening symptoms.  Patient states, " I think that was an error on my part when I pressed the button to answer the question."  Patient states she has had her follow up with her doctor. Patient reports she may have to have an iron infusion due to her iron level being low.  Patient states, " My doctor is looking into this. I'm hoping this will give me more energy."  Patient states she continues to take her medication as prescribed and knows to follow up with her doctor if she has symptoms.  RNCM informed patient she would continue to received EMMI automated phone calls.  Patient states she would like to have calls discontinued.  RNCM discussed and offered [facility] care management services to patient. Patient declined. Patient verbally agreed to received [facility] brochure and magnet mailed to her.  RNCM advised patient to notify MD of any changes in condition prior to scheduled appointment. RNCM verified patient aware of 911 services for urgent/ emergent needs.   PLAN: RNCM will close patient due to refusal of services.  RNCM will send patient [facility] care management brochure/ magnet.  RNCM will have case management assistant discontinue EMMI heart failure automated calls at the request of the patient.   Quinn Plowman RN,BSN,CCM [facility] Telephonic  [phone]

## 2017-07-16 ENCOUNTER — Telehealth: Payer: Self-pay | Admitting: Family Medicine

## 2017-07-16 ENCOUNTER — Encounter: Payer: Self-pay | Admitting: Cardiology

## 2017-07-16 ENCOUNTER — Other Ambulatory Visit (INDEPENDENT_AMBULATORY_CARE_PROVIDER_SITE_OTHER): Payer: PPO

## 2017-07-16 DIAGNOSIS — D62 Acute posthemorrhagic anemia: Secondary | ICD-10-CM

## 2017-07-16 LAB — CBC
HCT: 27.6 % — ABNORMAL LOW (ref 36.0–46.0)
Hemoglobin: 9 g/dL — ABNORMAL LOW (ref 12.0–15.0)
MCHC: 32.5 g/dL (ref 30.0–36.0)
MCV: 86 fl (ref 78.0–100.0)
Platelets: 518 10*3/uL — ABNORMAL HIGH (ref 150.0–400.0)
RBC: 3.21 Mil/uL — ABNORMAL LOW (ref 3.87–5.11)
RDW: 14.6 % (ref 11.5–15.5)
WBC: 15.8 10*3/uL — ABNORMAL HIGH (ref 4.0–10.5)

## 2017-07-16 NOTE — Telephone Encounter (Signed)
Also received repeat CBC today-  Results for orders placed or performed in visit on 07/16/17  CBC  Result Value Ref Range   WBC 15.8 (H) 4.0 - 10.5 K/uL   RBC 3.21 (L) 3.87 - 5.11 Mil/uL   Platelets 518.0 (H) 150.0 - 400.0 K/uL   Hemoglobin 9.0 (L) 12.0 - 15.0 g/dL   HCT 27.6 (L) 36.0 - 46.0 %   MCV 86.0 78.0 - 100.0 fl   MCHC 32.5 30.0 - 36.0 g/dL   RDW 14.6 11.5 - 15.5 %   *Note: Due to a large number of results and/or encounters for the requested time period, some results have not been displayed. A complete set of results can be found in Results Review.   Called her to discuss.  Her red cell counts are trending up which is good.  Also noted leukocytosis.  Pt denies any acute illness sx or any fever.  She feels "weak" but this is unchanged.  She wonders what we will do next to get her feeling stronger and if her iron level is ok, does she need an iron infusion.   Advised her that I will pass her questions along to Dr. Etter Sjogren for her so they can plan to next step.

## 2017-07-16 NOTE — Telephone Encounter (Signed)
Unless patient is completely out this should wait for pmf

## 2017-07-16 NOTE — Progress Notes (Signed)
Remote pacemaker transmission.   

## 2017-07-16 NOTE — Telephone Encounter (Signed)
Copied from [place] [id number]. Topic: Quick Communication - See Telephone Encounter >> Jul 16, 2017 11:34 AM Rosalin Hawking wrote: CRM for notification. See Telephone encounter for: 07/16/17.   Pt dropped off document to be filled out Disability parking placard. Pt would like to be called when ready. Document put at front office tray.

## 2017-07-16 NOTE — Progress Notes (Signed)
Letter  

## 2017-07-17 ENCOUNTER — Telehealth: Payer: Self-pay | Admitting: *Deleted

## 2017-07-17 ENCOUNTER — Other Ambulatory Visit: Payer: Self-pay | Admitting: Family Medicine

## 2017-07-17 ENCOUNTER — Telehealth: Payer: Self-pay | Admitting: Internal Medicine

## 2017-07-17 ENCOUNTER — Encounter: Payer: Self-pay | Admitting: Physician Assistant

## 2017-07-17 ENCOUNTER — Ambulatory Visit: Payer: PPO | Admitting: Physician Assistant

## 2017-07-17 ENCOUNTER — Telehealth: Payer: Self-pay

## 2017-07-17 VITALS — BP 104/50 | HR 76 | Ht 60.0 in | Wt 95.2 lb

## 2017-07-17 DIAGNOSIS — D638 Anemia in other chronic diseases classified elsewhere: Secondary | ICD-10-CM

## 2017-07-17 DIAGNOSIS — R52 Pain, unspecified: Secondary | ICD-10-CM

## 2017-07-17 DIAGNOSIS — Z7901 Long term (current) use of anticoagulants: Secondary | ICD-10-CM

## 2017-07-17 LAB — CUP PACEART REMOTE DEVICE CHECK
Battery Remaining Longevity: 20 mo
Battery Voltage: 2.9 V
Brady Statistic AP VP Percent: 4.6 %
Brady Statistic AP VS Percent: 0.06 %
Brady Statistic AS VP Percent: 95.29 %
Brady Statistic AS VS Percent: 0.05 %
Brady Statistic RA Percent Paced: 4.64 %
Brady Statistic RV Percent Paced: 99.87 %
Date Time Interrogation Session: 20190430044410
Implantable Lead Implant Date: 20111005
Implantable Lead Implant Date: 20111005
Implantable Lead Implant Date: 20111005
Implantable Lead Location: 753858
Implantable Lead Location: 753859
Implantable Lead Location: 753860
Implantable Lead Model: 4196
Implantable Lead Model: 5076
Implantable Lead Model: 5076
Implantable Pulse Generator Implant Date: 20111005
Lead Channel Impedance Value: 285 Ohm
Lead Channel Impedance Value: 323 Ohm
Lead Channel Impedance Value: 418 Ohm
Lead Channel Impedance Value: 437 Ohm
Lead Channel Impedance Value: 437 Ohm
Lead Channel Impedance Value: 437 Ohm
Lead Channel Impedance Value: 494 Ohm
Lead Channel Impedance Value: 494 Ohm
Lead Channel Impedance Value: 722 Ohm
Lead Channel Pacing Threshold Amplitude: 0.5 V
Lead Channel Pacing Threshold Amplitude: 0.625 V
Lead Channel Pacing Threshold Pulse Width: 0.4 ms
Lead Channel Pacing Threshold Pulse Width: 0.4 ms
Lead Channel Sensing Intrinsic Amplitude: 1.125 mV
Lead Channel Sensing Intrinsic Amplitude: 1.125 mV
Lead Channel Sensing Intrinsic Amplitude: 13 mV
Lead Channel Sensing Intrinsic Amplitude: 9.125 mV
Lead Channel Setting Pacing Amplitude: 1.25 V
Lead Channel Setting Pacing Amplitude: 2 V
Lead Channel Setting Pacing Amplitude: 2.5 V
Lead Channel Setting Pacing Pulse Width: 0.4 ms
Lead Channel Setting Pacing Pulse Width: 0.8 ms
Lead Channel Setting Sensing Sensitivity: 0.9 mV

## 2017-07-17 MED ORDER — OXYCODONE HCL 5 MG PO TABS: 10.0000 mg | ORAL_TABLET | Freq: Four times a day (QID) | ORAL | 0 refills | Status: DC | PRN

## 2017-07-17 NOTE — Telephone Encounter (Signed)
Patient notified

## 2017-07-17 NOTE — Telephone Encounter (Signed)
hgb is better----  Pt needs GI f/u---  I spoke with Dr Lyndel Safe --- does she want to see him in [place]? Or Dr Lorayne Bender in [place].   They are both [place]

## 2017-07-17 NOTE — Telephone Encounter (Signed)
I sent it in 

## 2017-07-17 NOTE — Progress Notes (Addendum)
Chief Complaint: Anemia and follow-up after recent hospitalization with ERCP  HPI:    [name] is a 72 year old female with a past medical history as listed below, who follows with Dr. Carlean Purl for her anemia of chronic disease presents to clinic today with a complaint of anemia and follow-up after recent hospitalization with ERCP.    06/07/2017 office visit Dr. Carlean Purl, had recently been started on Eliquis after a stroke/TIA.  Recommended she stay on this as she had limited gastrointestinal bleeding risk.  EGD noted 2014 with duodenitis and a flex sig 2012 with diverticulosis.    06/21/2017 consult for possible CBD stone.  ERCP with choledocholithiasis status post biliary sphincterotomy and balloon extraction.  Ascending cholangitis and cholelithiasis.  Patient then had open cholecystectomy 06/27/2017.    07/16/2017 CBC with a hemoglobin of 9.0 (7.6 07/10/2017), white blood cell count 15.8.  07/10/17 iron panel with iron low at less than 10, transferrin low at 128, saturation low at 5.6, ferritin elevated at 712.9.    Today, presents to clinic and explains that she feels overly fatigued.  She is wondering if she could have an iron infusion to help make her feel better.  Apparently Dr. Olevia Perches ordered these for her in the past.  She was told by her PCP that they could not order iron infusions.    Denies any GI symptoms today including bright red blood in her stool, black tarry stools, anorexia, nausea, vomiting, abdominal pain or change in bowel habits.  Past Medical History:  Diagnosis Date  . Anemia   . Anginal pain ([place])   . Arthritis    right hip  . Atrial fibrillation ([place])   . Blood transfusion   . CAD (coronary artery disease)    mild disease per cath in 2011  . CHF (congestive heart failure) ([place])   . Chronic abdominal pain   . Chronic nausea   . Chronic pain syndrome   . COPD (chronic obstructive pulmonary disease) (H[facility])    continuous O2- 2l  . Diverticulitis   . Elbow fracture,  left aug 2012  . Elevated liver enzymes   . Fibromyalgia    COUPLE OF TIMES A YEAR-PT HAS EPISODES OF CONFUSION-USUALLY INVOLVES DAY/NIGHT REVERSAL AND EPISODES OF TWITCHING AND FALLS--SEES DR. Jacelyn Grip - NEUROLOGIST-LAST SEEN 07/10/11  . Gastroparesis   . GERD (gastroesophageal reflux disease)   . H/O: GI bleed    from Pradaxa  . Hepatitis    hx of in high school   . HTN (hypertension)    under control; has been on med. x "years"  . Hx MRSA infection   . Hx of gastric ulcer   . Hx of stress fx aug 2011   right hip   . LBBB (left bundle branch block)   . Oxygen dependent    2 liters via nasal cannula at all times  . Pacemaker    CRT therapy; followed by Dr. Caryl Comes  . Pleural effusion     s/p right thoracentesis 03/09  . Pneumonia - 03/09, 12/09, 02/10, 06/10   MOST RECENT FEB 2013  . Primary dilated cardiomyopathy ([facility])    EF 45 to 50% per echo in Jan 2012  . RLS (restless legs syndrome)   . Silent aspiration   . Small bowel obstruction ([place])   . Urinary incontinence     -INTERSTIM IMPLANT NOT FUNCTIONING PER PT  . Venous embolism and thrombosis of subclavian vein ([place])    after pacemaker insertion Oct 2011  .  Vitamin B12 deficiency     Past Surgical History:  Procedure Laterality Date  . APPENDECTOMY    . CARDIAC CATHETERIZATION  04/08/2006, 11/16/2009  . CHOLECYSTECTOMY N/A 06/27/2017   Procedure: OPEN CHOLECYSTECTOMY;  Surgeon: Ralene Ok, MD;  Location: [facility];  Service: General;  Laterality: N/A;  . COLECTOMY     Intestinal resection (5times)  . COLECTOMY  02/04/2000   ex. lap., intra-abd. subtotal colectomy with ileosigmoid colon anastomosies and lysis of adhesions  . CYSTOSCOPY  11/11/2008  . CYSTOSCOPY W/ RETROGRADES  11/11/2008   right  . CYSTOSCOPY WITH INJECTION  04/30/2006   transurethral collagen injection; incision vaginal stenosis  . ERCP N/A 06/23/2017   Procedure: ENDOSCOPIC RETROGRADE CHOLANGIOPANCREATOGRAPHY (ERCP);  Surgeon: Carol Ada, MD;   Location: [facility];  Service: Endoscopy;  Laterality: N/A;  . ERCP N/A 06/24/2017   Procedure: ENDOSCOPIC RETROGRADE CHOLANGIOPANCREATOGRAPHY (ERCP);  Surgeon: [name], MD;  Location: [facility];  Service: Endoscopy;  Laterality: N/A;  . ESOPHAGOGASTRODUODENOSCOPY N/A 02/02/2013   Procedure: ESOPHAGOGASTRODUODENOSCOPY (EGD);  Surgeon: Lafayette Dragon, MD;  Location: [facility];  Service: Endoscopy;  Laterality: N/A;  . EXPLORATORY LAPAROTOMY  04/27/2009   lysis of adhesions, gastrostomy tube  . GASTROCUTANEOUS FISTULA CLOSURE    . INTERSTIM IMPLANT PLACEMENT  05/28/2006 - stage I   06/05/2006 - stage II  . INTERSTIM IMPLANT PLACEMENT  02/05/2012   Procedure: [name] IMPLANT FIRST STAGE;  Surgeon: Reece Packer, MD;  Location: [facility];  Service: Urology;  Laterality: N/A;  Replacement of Interstim Lead     . INTERSTIM IMPLANT REVISION  03/06/2011   Procedure: REVISION OF [name];  Surgeon: Reece Packer, MD;  Location: [facility];  Service: Urology;  Laterality: N/A;  Replacement of Neurostimulator  . INTERSTIM IMPLANT REVISION  10/23/2007  . PACEMAKER PLACEMENT  12/21/2009  . Peg removed     with complications  . PORT-A-CATH REMOVAL  07/27/2011   Procedure: REMOVAL PORT-A-CATH;  Surgeon: Rolm Bookbinder, MD;  Location: [facility];  Service: General;  Laterality: N/A;  . PORTACATH PLACEMENT  12/16/2009  . SAVORY DILATION N/A 02/02/2013   Procedure: SAVORY DILATION;  Surgeon: Lafayette Dragon, MD;  Location[facility];  Service: Endoscopy;  Laterality: N/A;  . SMALL INTESTINE SURGERY  05/20/2001   ex. lap., resection of small bowel stricture; gastrostomy; insertion central line  . TONSILLECTOMY  age 27  . TOTAL ABDOMINAL HYSTERECTOMY     complete  . TOTAL HIP ARTHROPLASTY  08/03/2011   Procedure: TOTAL HIP ARTHROPLASTY ANTERIOR APPROACH;  Surgeon: Mcarthur Rossetti, MD;  Location: [facility];  Service: Orthopedics;  Laterality: Right;    Current Outpatient  Medications  Medication Sig Dispense Refill  . acetaminophen (TYLENOL) 325 MG tablet Take 2 tablets (650 mg total) by mouth every 4 (four) hours as needed.    [name] amLODipine (NORVASC) 5 MG tablet Take 1 tablet (5 mg total) by mouth daily. 90 tablet 1  . apixaban (ELIQUIS) 5 MG TABS tablet Take 1 tablet (5 mg total) by mouth 2 (two) times daily. 180 tablet 1  . atorvastatin (LIPITOR) 20 MG tablet Take 1 tablet (20 mg total) by mouth daily at 6 PM. 90 tablet 1  . carvedilol (COREG) 25 MG tablet TAKE 1 TABLET BY MOUTH TWICE DAILY (Patient taking differently: TAKE 1 TABLET (25mg ) BY MOUTH TWICE DAILY) 180 tablet 1  . furosemide (LASIX) 40 MG tablet TAKE ONE TABLET BY MOUTH IN THE MORNING (Patient taking differently: TAKE ONE TABLET (40mg ) BY  MOUTH IN THE MORNING) 90 tablet 1  . gabapentin (NEURONTIN) 600 MG tablet Take 1 tablet (600 mg total) by mouth 3 (three) times daily.    . nitroGLYCERIN (NITROSTAT) 0.4 MG SL tablet Place 1 tablet (0.4 mg total) under the tongue every 5 (five) minutes as needed. CHEST PAIN 25 tablet 5  . oxyCODONE (OXY IR/ROXICODONE) 5 MG immediate release tablet Take 2 tablets (10 mg total) by mouth every 6 (six) hours as needed for moderate pain or severe pain. 120 tablet 0  . pantoprazole (PROTONIX) 40 MG tablet Take 1 tablet (40 mg total) by mouth 2 (two) times daily. 60 tablet 0  . polyethylene glycol (MIRALAX / GLYCOLAX) packet Take 17 g by mouth daily. 14 each 0  . promethazine (PHENERGAN) 25 MG tablet Take 1 tablet (25 mg total) by mouth every 8 (eight) hours as needed. for nausea 40 tablet 5  . SPIRIVA RESPIMAT 1.25 MCG/ACT AERS INHALE 2 PUFFS INTO THE LUNGS DAILY 1 Inhaler 5  . Tiotropium Bromide Monohydrate (SPIRIVA RESPIMAT) 1.25 MCG/ACT AERS Inhale 2 puffs into the lungs daily.    [name] tiZANidine (ZANAFLEX) 4 MG tablet Take 1 tablet (4 mg total) by mouth 3 (three) times daily as needed. for muscle spams 90 tablet 2  . zolpidem (AMBIEN) 5 MG tablet 1 po qhs prn 30 tablet 2     No current facility-administered medications for this visit.     Allergies as of 07/17/2017 - Review Complete 07/08/2017  Allergen Reaction Noted  . Dabigatran etexilate mesylate Other (See Comments) 06/26/2010  . Talwin [pentazocine] Other (See Comments) 09/12/2011  . Lactate Rash 05/16/2015    Family History  Problem Relation Age of Onset  . Heart disease Father   . Stroke Mother   . Heart disease Mother   . Heart disease Brother   . Breast cancer Maternal Aunt   . Heart disease Paternal Aunt   . Diabetes Paternal Grandfather   . Colon cancer Neg Hx     Social History   Socioeconomic History  . Marital status: Married    Spouse name: Not on file  . Number of children: 5  . Years of education: Not on file  . Highest education level: Not on file  Occupational History  . Occupation: disabled    Employer: RETIRED  Social Needs  . Financial resource strain: Not on file  . Food insecurity:    Worry: Not on file    Inability: Not on file  . Transportation needs:    Medical: Not on file    Non-medical: Not on file  Tobacco Use  . Smoking status: Former Smoker    Packs/day: 1.00    Years: 20.00    Pack years: 20.00    Last attempt to quit: 03/19/1986    Years since quitting: 31.3  . Smokeless tobacco: Never Used  Substance and Sexual Activity  . Alcohol use: No    Alcohol/week: 0.0 oz  . Drug use: No  . Sexual activity: Yes  Lifestyle  . Physical activity:    Days per week: Not on file    Minutes per session: Not on file  . Stress: Not on file  Relationships  . Social connections:    Talks on phone: Not on file    Gets together: Not on file    Attends religious service: Not on file    Active member of club or organization: Not on file    Attends meetings of clubs or organizations: Not  on file    Relationship status: Not on file  . Intimate partner violence:    Fear of current or ex partner: Not on file    Emotionally abused: Not on file    Physically  abused: Not on file    Forced sexual activity: Not on file  Other Topics Concern  . Not on file  Social History Narrative   Occupation: Disabled   Married - second time - biologic daughter (in [place]) from first marriage - adopted 4 3 alive with second marriage   Alcohol Use - no   Illicit Drug Use - no   Patient is a former smoker, quit 1988 x49yrs, <1ppd.    Daily Caffeine Use   01/20/2015 - updated    Review of Systems:    Constitutional: No weight loss, fever or chills Cardiovascular: No chest pain Respiratory: chronic SOB on O2 via [place] Gastrointestinal: See HPI and otherwise negative   Physical Exam:  Vital signs: BP (!) 104/50 (BP Location: Right Arm, Patient Position: Sitting, Cuff Size: Normal)   Pulse 76   Ht 5' (1.524 m)   Wt 95 lb 4 oz (43.2 kg)   BMI 18.60 kg/m   Constitutional:   Pleasant ill-appearing, elderly Caucasian female appears to be in NAD, Well developed, Well nourished, alert and cooperative, ambulating in wheelchair Respiratory: Respirations even and unlabored. Lungs clear to auscultation bilaterally.   No wheezes, crackles, or rhonchi. On O2 via [place] Cardiovascular: Normal S1, S2. No MRG. Regular rate and rhythm. No peripheral edema, cyanosis or pallor.  Gastrointestinal:  Soft, nondistended, nontender. No rebound or guarding. Normal bowel sounds. No appreciable masses or hepatomegaly. Psychiatric: Demonstrates good judgement and reason without abnormal affect or behaviors.  RELEVANT LABS AND IMAGING: CBC    Component Value Date/Time   WBC 15.8 (H) 07/16/2017 1011   RBC 3.21 (L) 07/16/2017 1011   HGB 9.0 (L) 07/16/2017 1011   HGB 10.7 (L) 04/01/2009 1048   HGB 11.8 05/01/2007 0845   HCT 27.6 (L) 07/16/2017 1011   HCT 32.4 (L) 04/01/2009 1048   HCT 37.7 05/01/2007 0845   PLT 518.0 (H) 07/16/2017 1011   PLT 420 (H) 04/01/2009 1048   PLT 264 05/01/2007 0845   MCV 86.0 07/16/2017 1011   MCV 83 04/01/2009 1048   MCV 87.8 05/01/2007 0845   MCH 28.9  07/02/2017 0640   MCHC 32.5 07/16/2017 1011   RDW 14.6 07/16/2017 1011   RDW 13.6 04/01/2009 1048   RDW 13.7 05/01/2007 0845   LYMPHSABS 0.5 (L) 07/10/2017 1049   LYMPHSABS 1.0 04/01/2009 1048   LYMPHSABS 0.6 (L) 05/01/2007 0845   MONOABS 0.9 07/10/2017 1049   MONOABS 0.5 05/01/2007 0845   EOSABS 0.1 07/10/2017 1049   EOSABS 0.1 04/01/2009 1048   BASOSABS 0.1 07/10/2017 1049   BASOSABS 0.0 04/01/2009 1048   BASOSABS 0.0 05/01/2007 0845    CMP     Component Value Date/Time   NA 131 (L) 07/10/2017 1049   K 4.6 07/10/2017 1049   CL 97 07/10/2017 1049   CO2 30 07/10/2017 1049   GLUCOSE 104 (H) 07/10/2017 1049   GLUCOSE 104 (H) 03/11/2006 1059   BUN 11 07/10/2017 1049   CREATININE 0.78 07/10/2017 1049   CREATININE 0.91 03/16/2016 1539   CALCIUM 8.5 07/10/2017 1049   PROT 6.9 07/10/2017 1049   ALBUMIN 3.0 (L) 07/10/2017 1049   AST 17 07/10/2017 1049   ALT 13 07/10/2017 1049   ALKPHOS 229 (H) 07/10/2017 1049  BILITOT 0.4 07/10/2017 1049   GFRNONAA >60 07/02/2017 0640   GFRAA >60 07/02/2017 0640   Iron/TIBC/Ferritin/ %Sat    Component Value Date/Time   IRON <10 (L) 07/10/2017 1049   TIBC 261 02/17/2014 1541   FERRITIN 712.9 (H) 07/10/2017 1049   IRONPCTSAT 5.6 (L) 07/10/2017 1049   IRONPCTSAT 12 (L) 02/17/2014 1541   Assessment: 1.  Chronic anemia: Hemoglobin recently increased at 9, did drop to a low of 7.6 week ago (around time of cholecystectomy), iron labs as above; unknown etiology 2.  Chronic anticoagulation: On Eliquis status post stroke 3.  Status post ERCP and recent cholecystectomy  Plan: 1.  Discussed with patient that her hemoglobin is actually improved from baseline at 9 per check yesterday.  Her recent ferritin was extremely elevated.  I do not believe she needs an iron infusion at this time. 2.  Patient asked if we have any further recommendations given her fatigue level.  I will discuss case with Dr. Carlean Purl.  Does not appear patient has been worked up  recently for this anemia, though I am not sure it has worsened from baseline and patient is chronically ill with oxygen usage and history of recent procedures for gallbladder. 3. Recommend she stay off of oral iron for now. 4.  Patient to await any further recommendations from Dr. Carlean Purl. Have range for follow-up appointment with him on May 15.  [facility] 07/17/2017, 2:11 PM  Cc: Carollee Herter, Alferd Apa, *   Addendum:  [id number] spoke with Dr. Carlean Purl who reviewed chart and believes this is anemia of chronic disease.  Will call patient and let her know that there is plenty of iron in her body but does not look like her body is using it correctly.  Would recommend that she speak with her primary care physician in regards to a hematology referral.  Possibly Dr. Marin Olp as patient has care in [facility] area.  Did go ahead and cancel follow-up with Dr. Carlean Purl at this time.  We will also send note to primary care provider with recommendations for hematology referral.  Becky Newer, [name]   Agree with Ms. Mort Sawyers evaluation and management.    I believe she was possibly misdiagnosed with iron-deficiency in past and findings now are consistent with chronic disease anemia. Fatigue not a surprise with recent events.  Seeing a hematologist would be more definitive.    Becky Mayer, MD, [facility]

## 2017-07-17 NOTE — Telephone Encounter (Signed)
Patient husband states pt iron is low and wants to know if they can schedule an iron infusion. Pt husband states pt was seen in hosp recently by Dr.Gupta as well.

## 2017-07-17 NOTE — Telephone Encounter (Signed)
Patient will come in today and see Ellouise Newer, PA at 2:15.  Recent hospitalization with ERCP and iron def.

## 2017-07-17 NOTE — Telephone Encounter (Signed)
Patient can wait until tomorrow

## 2017-07-17 NOTE — Telephone Encounter (Signed)
-----   Message from Levin Erp, [place] sent at 07/17/2017  3:48 PM EDT ----- Regarding: Anemia Please call patient and let her know I spoke with Dr. Carlean Purl.  He believes anemia is anemia of chronic disease.  With an elevated ferritin such as she has, it appears her body has plenty of iron but is not using it correctly.  He suggested she follow-up with her primary care physician in regards to possible referral to hematology (we also let them know those recommendations).  Possibly Dr. Marin Olp for further evaluation and to allow them to monitor her iron in the future.  We are here to help if she has any questions. Thank you, Ellouise Newer, PA-C

## 2017-07-17 NOTE — Telephone Encounter (Addendum)
Received Physician Orders from [facility] [x2]; forwarded to provider/SLS 05/01

## 2017-07-17 NOTE — Telephone Encounter (Signed)
She saw Dr. Lyndel Safe and will look at labs and possible do the iron infusion

## 2017-07-18 ENCOUNTER — Other Ambulatory Visit: Payer: Self-pay | Admitting: Family Medicine

## 2017-07-18 ENCOUNTER — Encounter: Payer: Self-pay | Admitting: Family Medicine

## 2017-07-18 ENCOUNTER — Telehealth: Payer: Self-pay | Admitting: *Deleted

## 2017-07-18 DIAGNOSIS — D638 Anemia in other chronic diseases classified elsewhere: Secondary | ICD-10-CM

## 2017-07-18 NOTE — Telephone Encounter (Signed)
The pt has been advised and will follow up with Dr Etter Sjogren and Marin Olp, the pt agreed. Result note sent to both.

## 2017-07-18 NOTE — Telephone Encounter (Signed)
Patient will need to come in office and sign form when picking up

## 2017-07-18 NOTE — Telephone Encounter (Signed)
-----   Message from Ann Held, DO sent at 07/17/2017  7:26 PM EDT ----- Regarding: FW: Anemia Let pt know we will refer to dr Marin Olp for eval ----- Message ----- From: Levin Erp, PA Sent: 07/17/2017   3:46 PM To: Ann Held, DO Subject: Anemia                                         Spoke with Dr. Carlean Purl who believes anemia is anemia of chronic disease.  Patient's ferritin is extremely elevated and hgb stable for her.  Do not recommend iron infusions today.  Also do not recommend further investigative procedures with EGD/colonoscopy at this time.  Dr. Carlean Purl suggested possible referral to hematology, possibly Dr. Marin Olp for further eval.  Thank you for letting me see your patient today, Let us know if we can be of any further assistance, Ellouise Newer, PA-C

## 2017-07-19 ENCOUNTER — Telehealth: Payer: Self-pay | Admitting: Family Medicine

## 2017-07-19 ENCOUNTER — Telehealth: Payer: Self-pay | Admitting: *Deleted

## 2017-07-19 ENCOUNTER — Other Ambulatory Visit: Payer: Self-pay | Admitting: *Deleted

## 2017-07-19 DIAGNOSIS — D638 Anemia in other chronic diseases classified elsewhere: Secondary | ICD-10-CM

## 2017-07-19 NOTE — Telephone Encounter (Signed)
Received Physician Orders from [facility]; forwarded to provider/SLS 05/03

## 2017-07-19 NOTE — Telephone Encounter (Signed)
Sent mychart message to patient

## 2017-07-19 NOTE — Telephone Encounter (Signed)
Copied from [place] [id number]. Topic: Quick Communication - See Telephone Encounter >> Jul 19, 2017 10:12 AM Aurelio Brash B wrote: CRM for notification. See Telephone encounter for: 07/19/17. Kayla from [facility] called about the pts  oxyCODONE (OXY IR/ROXICODONE) 5 MG immediate release tablet, she states [employer] will not pay for 20 day day supply but will for 30 day supply.   [place], [place] [phone] (Phone) [phone] (Fax)

## 2017-07-19 NOTE — Telephone Encounter (Signed)
Notified patient that Dr. Etter Sjogren will be back on Monday to fix.

## 2017-07-19 NOTE — Telephone Encounter (Signed)
Can you fix and send in patients oxycodone again.  She takes it 1 tab every 4 hours.    Sent [employer].  Also advised about referral to Dr. Marin Olp

## 2017-07-22 ENCOUNTER — Other Ambulatory Visit: Payer: Self-pay | Admitting: Family Medicine

## 2017-07-22 DIAGNOSIS — R52 Pain, unspecified: Secondary | ICD-10-CM

## 2017-07-22 MED ORDER — OXYCODONE-ACETAMINOPHEN 10-325 MG PO TABS: 1.0000 | ORAL_TABLET | ORAL | 0 refills | Status: DC | PRN

## 2017-07-22 NOTE — Telephone Encounter (Signed)
Patient notified

## 2017-07-23 ENCOUNTER — Other Ambulatory Visit (INDEPENDENT_AMBULATORY_CARE_PROVIDER_SITE_OTHER): Payer: PPO

## 2017-07-23 DIAGNOSIS — D638 Anemia in other chronic diseases classified elsewhere: Secondary | ICD-10-CM | POA: Diagnosis not present

## 2017-07-23 LAB — CBC WITH DIFFERENTIAL/PLATELET
Basophils Absolute: 0.1 10*3/uL (ref 0.0–0.1)
Basophils Relative: 0.8 % (ref 0.0–3.0)
Eosinophils Absolute: 0.1 10*3/uL (ref 0.0–0.7)
Eosinophils Relative: 1.5 % (ref 0.0–5.0)
HCT: 29 % — ABNORMAL LOW (ref 36.0–46.0)
Hemoglobin: 9.6 g/dL — ABNORMAL LOW (ref 12.0–15.0)
Lymphocytes Relative: 10.5 % — ABNORMAL LOW (ref 12.0–46.0)
Lymphs Abs: 1 10*3/uL (ref 0.7–4.0)
MCHC: 33 g/dL (ref 30.0–36.0)
MCV: 86 fl (ref 78.0–100.0)
Monocytes Absolute: 0.6 10*3/uL (ref 0.1–1.0)
Monocytes Relative: 6.9 % (ref 3.0–12.0)
Neutro Abs: 7.3 10*3/uL (ref 1.4–7.7)
Neutrophils Relative %: 80.3 % — ABNORMAL HIGH (ref 43.0–77.0)
Platelets: 384 10*3/uL (ref 150.0–400.0)
RBC: 3.37 Mil/uL — ABNORMAL LOW (ref 3.87–5.11)
RDW: 15.1 % (ref 11.5–15.5)
WBC: 9.1 10*3/uL (ref 4.0–10.5)

## 2017-07-25 ENCOUNTER — Telehealth: Payer: Self-pay | Admitting: *Deleted

## 2017-07-25 NOTE — Telephone Encounter (Signed)
Received Physician Orders from [facility]; forwarded to provider/SLS 05/09

## 2017-07-29 ENCOUNTER — Other Ambulatory Visit: Payer: Self-pay | Admitting: Cardiology

## 2017-07-30 ENCOUNTER — Telehealth: Payer: Self-pay | Admitting: *Deleted

## 2017-07-30 NOTE — Telephone Encounter (Addendum)
Received Home Health Certification and Plan of Care; forwarded to provider/SLS 05/14

## 2017-07-31 ENCOUNTER — Ambulatory Visit: Payer: PPO | Admitting: Internal Medicine

## 2017-08-01 NOTE — Progress Notes (Deleted)
HPI She presents for followup of a nonischemic cardiomyopathy and atrial fibrillation.  Since I last saw her she has been hospitalized twice.   Once was for cystitis and a TIA. On that admission in March she was started on Eliquis.  This had been stopped because of a history of GI bleeding and no recurrent fib on her device implant.   The second hospitalization was for acute cholecystitis .   I reviewed these records for this visit.   ***   has seen Dr. Caryl Comes who increased her Coreg.  She had syncope in Nov without clear etiology.  She does not report any further events.  She has been doing well except she is starting to feel the effects of pollen.  She has a mild productive cough but no fevers or chills.   The patient denies any new symptoms such as chest discomfort, neck or arm discomfort. There has been no new shortness of breath, PND or orthopnea. There have been no reported palpitations, presyncope or syncope.  She wears O2 and has no new dyspnea if she is wearing this.      Allergies  Allergen Reactions  . Dabigatran Etexilate Mesylate Other (See Comments)    INTERNAL BLEEDING-pradaxa  . Talwin [Pentazocine] Other (See Comments)    hallucinations  . Lactate Rash    Current Outpatient Medications  Medication Sig Dispense Refill  . acetaminophen (TYLENOL) 325 MG tablet Take 2 tablets (650 mg total) by mouth every 4 (four) hours as needed.    [name] amLODipine (NORVASC) 5 MG tablet Take 1 tablet (5 mg total) by mouth daily. 90 tablet 1  . apixaban (ELIQUIS) 5 MG TABS tablet Take 1 tablet (5 mg total) by mouth 2 (two) times daily. 180 tablet 1  . atorvastatin (LIPITOR) 20 MG tablet Take 1 tablet (20 mg total) by mouth daily at 6 PM. 90 tablet 1  . carvedilol (COREG) 25 MG tablet TAKE 1 TABLET BY MOUTH TWICE DAILY (Patient taking differently: TAKE 1 TABLET (25mg ) BY MOUTH TWICE DAILY) 180 tablet 1  . furosemide (LASIX) 40 MG tablet TAKE ONE TABLET (40mg ) BY MOUTH IN THE MORNING 90 tablet 3  .  gabapentin (NEURONTIN) 600 MG tablet Take 1 tablet (600 mg total) by mouth 3 (three) times daily.    . nitroGLYCERIN (NITROSTAT) 0.4 MG SL tablet Place 1 tablet (0.4 mg total) under the tongue every 5 (five) minutes as needed. CHEST PAIN 25 tablet 5  . oxyCODONE-acetaminophen (PERCOCET) 10-325 MG tablet Take 1 tablet by mouth every 4 (four) hours as needed. PAIN 120 tablet 0  . pantoprazole (PROTONIX) 40 MG tablet Take 1 tablet (40 mg total) by mouth 2 (two) times daily. 60 tablet 0  . polyethylene glycol (MIRALAX / GLYCOLAX) packet Take 17 g by mouth daily. 14 each 0  . promethazine (PHENERGAN) 25 MG tablet Take 1 tablet (25 mg total) by mouth every 8 (eight) hours as needed. for nausea 40 tablet 5  . SPIRIVA RESPIMAT 1.25 MCG/ACT AERS INHALE 2 PUFFS INTO THE LUNGS DAILY 1 Inhaler 5  . Tiotropium Bromide Monohydrate (SPIRIVA RESPIMAT) 1.25 MCG/ACT AERS Inhale 2 puffs into the lungs daily.    [name] tiZANidine (ZANAFLEX) 4 MG tablet Take 1 tablet (4 mg total) by mouth 3 (three) times daily as needed. for muscle spams 90 tablet 2  . zolpidem (AMBIEN) 5 MG tablet 1 po qhs prn 30 tablet 2   No current facility-administered medications for this visit.     Past  Medical History:  Diagnosis Date  . Anemia   . Anginal pain ([place])   . Arthritis    right hip  . Atrial fibrillation ([place])   . Blood transfusion   . CAD (coronary artery disease)    mild disease per cath in 2011  . CHF (congestive heart failure) ([place])   . Chronic abdominal pain   . Chronic nausea   . Chronic pain syndrome   . COPD (chronic obstructive pulmonary disease) (HCC)    continuous O2- 2l  . Diverticulitis   . Elbow fracture, left aug 2012  . Elevated liver enzymes   . Fibromyalgia    COUPLE OF TIMES A YEAR-PT HAS EPISODES OF CONFUSION-USUALLY INVOLVES DAY/NIGHT REVERSAL AND EPISODES OF TWITCHING AND FALLS--SEES DR. Jacelyn Grip - NEUROLOGIST-LAST SEEN 07/10/11  . Gastroparesis   . GERD (gastroesophageal reflux disease)   . H/O: GI  bleed    from Pradaxa  . Hepatitis    hx of in high school   . HTN (hypertension)    under control; has been on med. x "years"  . Hx MRSA infection   . Hx of gastric ulcer   . Hx of stress fx aug 2011   right hip   . LBBB (left bundle branch block)   . Oxygen dependent    2 liters via nasal cannula at all times  . Pacemaker    CRT therapy; followed by Dr. Caryl Comes  . Pleural effusion     s/p right thoracentesis 03/09  . Pneumonia - 03/09, 12/09, 02/10, 06/10   MOST RECENT FEB 2013  . Primary dilated cardiomyopathy (HCC)    EF 45 to 50% per echo in Jan 2012  . RLS (restless legs syndrome)   . Silent aspiration   . Small bowel obstruction ([place])   . Urinary incontinence     -INTERSTIM IMPLANT NOT FUNCTIONING PER PT  . Venous embolism and thrombosis of subclavian vein ([place])    after pacemaker insertion Oct 2011  . Vitamin B12 deficiency     Past Surgical History:  Procedure Laterality Date  . APPENDECTOMY    . CARDIAC CATHETERIZATION  04/08/2006, 11/16/2009  . CHOLECYSTECTOMY N/A 06/27/2017   Procedure: OPEN CHOLECYSTECTOMY;  Surgeon: Ralene Ok, MD;  Location: [facility];  Service: General;  Laterality: N/A;  . COLECTOMY     Intestinal resection (5times)  . COLECTOMY  02/04/2000   ex. lap., intra-abd. subtotal colectomy with ileosigmoid colon anastomosies and lysis of adhesions  . CYSTOSCOPY  11/11/2008  . CYSTOSCOPY W/ RETROGRADES  11/11/2008   right  . CYSTOSCOPY WITH INJECTION  04/30/2006   transurethral collagen injection; incision vaginal stenosis  . ERCP N/A 06/23/2017   Procedure: ENDOSCOPIC RETROGRADE CHOLANGIOPANCREATOGRAPHY (ERCP);  Surgeon: Carol Ada, MD;  Location: [facility];  Service: Endoscopy;  Laterality: N/A;  . ERCP N/A 06/24/2017   Procedure: ENDOSCOPIC RETROGRADE CHOLANGIOPANCREATOGRAPHY (ERCP);  Surgeon: [name], MD;  Location: [facility];  Service: Endoscopy;  Laterality: N/A;  . ESOPHAGOGASTRODUODENOSCOPY N/A 02/02/2013   Procedure:  ESOPHAGOGASTRODUODENOSCOPY (EGD);  Surgeon: Lafayette Dragon, MD;  Location: [facility];  Service: Endoscopy;  Laterality: N/A;  . EXPLORATORY LAPAROTOMY  04/27/2009   lysis of adhesions, gastrostomy tube  . GASTROCUTANEOUS FISTULA CLOSURE    . INTERSTIM IMPLANT PLACEMENT  05/28/2006 - stage I   06/05/2006 - stage II  . INTERSTIM IMPLANT PLACEMENT  02/05/2012   Procedure: [name] IMPLANT FIRST STAGE;  Surgeon: Reece Packer, MD;  Location: [facility];  Service: Urology;  Laterality: N/A;  Replacement of Interstim Lead     . INTERSTIM IMPLANT REVISION  03/06/2011   Procedure: REVISION OF [name];  Surgeon: Reece Packer, MD;  [facility];  Service: Urology;  Laterality: N/A;  Replacement of Neurostimulator  . INTERSTIM IMPLANT REVISION  10/23/2007  . PACEMAKER PLACEMENT  12/21/2009  . Peg removed     with complications  . PORT-A-CATH REMOVAL  07/27/2011   Procedure: REMOVAL PORT-A-CATH;  Surgeon: Rolm Bookbinder, MD;  Location: [place];  Service: General;  Laterality: N/A;  . PORTACATH PLACEMENT  12/16/2009  . SAVORY DILATION N/A 02/02/2013   Procedure: SAVORY DILATION;  Surgeon: Lafayette Dragon, MD;  [facility];  Service: Endoscopy;  Laterality: N/A;  . SMALL INTESTINE SURGERY  05/20/2001   ex. lap., resection of small bowel stricture; gastrostomy; insertion central line  . TONSILLECTOMY  age 63  . TOTAL ABDOMINAL HYSTERECTOMY     complete  . TOTAL HIP ARTHROPLASTY  08/03/2011   Procedure: TOTAL HIP ARTHROPLASTY ANTERIOR APPROACH;  Surgeon: Mcarthur Rossetti, MD;  [facility];  Service: Orthopedics;  Laterality: Right;    ROS:  ***.  Otherwise, as stated in the HPI and negative for all other systems.  PHYSICAL EXAM There were no vitals taken for this visit.  GENERAL:  Well appearing NECK:  No jugular venous distention, waveform within normal limits, carotid upstroke brisk and symmetric, no bruits, no thyromegaly LUNGS:  Clear to  auscultation bilaterally CHEST:  Unremarkable HEART:  PMI not displaced or sustained,S1 and S2 within normal limits, no S3, no S4, no clicks, no rubs, *** murmurs ABD:  Flat, positive bowel sounds normal in frequency in pitch, no bruits, no rebound, no guarding, no midline pulsatile mass, no hepatomegaly, no splenomegaly EXT:  2 plus pulses throughout, no edema, no cyanosis no clubbing   GENERAL:  Frail appearing.  No distress NECK:  No jugular venous distention, waveform within normal limits, carotid upstroke brisk and symmetric, no bruits, no thyromegaly LYMPHATICS:  No cervical, inguinal adenopathy LUNGS:  Clear to auscultation bilaterally BACK:  No CVA tenderness CHEST: CRT pocket intact HEART:  PMI not displaced or sustained,S1 and S2 within normal limits, no S3, no S4, no clicks, no rubs, no murmurs ABD:  Flat, positive bowel sounds normal in frequency in pitch, no bruits, no rebound, no guarding, no midline pulsatile mass, no hepatomegaly, no splenomegaly EXT:  2 plus pulses throughout, no edema, no cyanosis no clubbing  EKG:  ***  ASSESSMENT AND PLAN   ATRIAL FIBRILLATION -  [name] has a CHA2DS2 - VASc score of 4 with a risk of stroke of 4%.  ***  However she is not having symptomatic recurrences of this.  I reviewed her last device interrogation and she has had no atrial fib documented.  She  had some bleeding while on anticoagulation in the past. She has lung issues and so was taken off amiodarone in the past. At this point without recurrent palpitations no change in therapy is indicated.    CARDIOMYOPATHY, PRIMARY, DILATED -  Her EF is now reported to be 65%.   ***  It was 50% in 2012.  She is euvolemic.  No change in meds is indciated.  No further imaging at this point.   HYPERTENSION -  The blood pressure is *** at target.   She tolerated in the increased dose of beta blocker.   No change in medications is indicated. We will continue with therapeutic lifestyle  changes (TLC).

## 2017-08-02 ENCOUNTER — Ambulatory Visit: Payer: PPO | Admitting: Cardiology

## 2017-08-08 ENCOUNTER — Ambulatory Visit: Payer: PPO | Admitting: Neurology

## 2017-08-10 DIAGNOSIS — R05 Cough: Secondary | ICD-10-CM | POA: Diagnosis not present

## 2017-08-10 DIAGNOSIS — I4891 Unspecified atrial fibrillation: Secondary | ICD-10-CM | POA: Diagnosis not present

## 2017-08-10 DIAGNOSIS — J449 Chronic obstructive pulmonary disease, unspecified: Secondary | ICD-10-CM | POA: Diagnosis not present

## 2017-08-13 ENCOUNTER — Other Ambulatory Visit: Payer: Self-pay | Admitting: Family

## 2017-08-13 DIAGNOSIS — D649 Anemia, unspecified: Secondary | ICD-10-CM

## 2017-08-14 ENCOUNTER — Inpatient Hospital Stay: Payer: PPO | Attending: Family | Admitting: Family

## 2017-08-14 ENCOUNTER — Telehealth: Payer: Self-pay | Admitting: Pulmonary Disease

## 2017-08-14 ENCOUNTER — Inpatient Hospital Stay: Payer: PPO

## 2017-08-14 ENCOUNTER — Encounter: Payer: Self-pay | Admitting: Family

## 2017-08-14 DIAGNOSIS — K909 Intestinal malabsorption, unspecified: Secondary | ICD-10-CM

## 2017-08-14 DIAGNOSIS — Z950 Presence of cardiac pacemaker: Secondary | ICD-10-CM

## 2017-08-14 DIAGNOSIS — J449 Chronic obstructive pulmonary disease, unspecified: Secondary | ICD-10-CM | POA: Diagnosis not present

## 2017-08-14 DIAGNOSIS — D508 Other iron deficiency anemias: Secondary | ICD-10-CM | POA: Diagnosis not present

## 2017-08-14 DIAGNOSIS — I10 Essential (primary) hypertension: Secondary | ICD-10-CM | POA: Diagnosis not present

## 2017-08-14 DIAGNOSIS — Z9981 Dependence on supplemental oxygen: Secondary | ICD-10-CM

## 2017-08-14 DIAGNOSIS — D649 Anemia, unspecified: Secondary | ICD-10-CM

## 2017-08-14 DIAGNOSIS — Z7901 Long term (current) use of anticoagulants: Secondary | ICD-10-CM

## 2017-08-14 DIAGNOSIS — D500 Iron deficiency anemia secondary to blood loss (chronic): Secondary | ICD-10-CM | POA: Insufficient documentation

## 2017-08-14 HISTORY — DX: Intestinal malabsorption, unspecified: K90.9

## 2017-08-14 LAB — CBC WITH DIFFERENTIAL (CANCER CENTER ONLY)
Basophils Absolute: 0 10*3/uL (ref 0.0–0.1)
Basophils Relative: 0 %
Eosinophils Absolute: 0.2 10*3/uL (ref 0.0–0.5)
Eosinophils Relative: 2 %
HCT: 28.2 % — ABNORMAL LOW (ref 34.8–46.6)
Hemoglobin: 8.9 g/dL — ABNORMAL LOW (ref 11.6–15.9)
Lymphocytes Relative: 6 %
Lymphs Abs: 0.6 10*3/uL — ABNORMAL LOW (ref 0.9–3.3)
MCH: 28.1 pg (ref 26.0–34.0)
MCHC: 31.6 g/dL — ABNORMAL LOW (ref 32.0–36.0)
MCV: 89 fL (ref 81.0–101.0)
Monocytes Absolute: 0.8 10*3/uL (ref 0.1–0.9)
Monocytes Relative: 8 %
Neutro Abs: 8.6 10*3/uL — ABNORMAL HIGH (ref 1.5–6.5)
Neutrophils Relative %: 84 %
Platelet Count: 388 10*3/uL (ref 145–400)
RBC: 3.17 MIL/uL — ABNORMAL LOW (ref 3.70–5.32)
RDW: 14.3 % (ref 11.1–15.7)
WBC Count: 10.2 10*3/uL — ABNORMAL HIGH (ref 3.9–10.0)

## 2017-08-14 LAB — CMP (CANCER CENTER ONLY)
ALT: 10 U/L (ref 0–55)
AST: 17 U/L (ref 5–34)
Albumin: 2.9 g/dL — ABNORMAL LOW (ref 3.5–5.0)
Alkaline Phosphatase: 94 U/L (ref 40–150)
Anion gap: 11 (ref 3–11)
BUN: 10 mg/dL (ref 7–26)
CO2: 27 mmol/L (ref 22–29)
Calcium: 8.6 mg/dL (ref 8.4–10.4)
Chloride: 101 mmol/L (ref 98–109)
Creatinine: 0.98 mg/dL (ref 0.60–1.10)
GFR, Est AFR Am: >60 mL/min (ref >60)
GFR, Estimated: 56 mL/min — ABNORMAL LOW (ref >60)
Glucose, Bld: 107 mg/dL (ref 70–140)
Potassium: 4.4 mmol/L (ref 3.5–5.1)
Sodium: 139 mmol/L (ref 136–145)
Total Bilirubin: 0.3 mg/dL (ref 0.2–1.2)
Total Protein: 7.3 g/dL (ref 6.4–8.3)

## 2017-08-14 LAB — FERRITIN: Ferritin: 340 ng/mL — ABNORMAL HIGH (ref 9–269)

## 2017-08-14 LAB — LACTATE DEHYDROGENASE: LDH: 228 U/L (ref 125–245)

## 2017-08-14 LAB — RETICULOCYTES
RBC.: 3.21 MIL/uL — ABNORMAL LOW (ref 3.70–5.45)
Retic Count, Absolute: 64.2 10*3/uL (ref 33.7–90.7)
Retic Ct Pct: 2 % (ref 0.7–2.1)

## 2017-08-14 LAB — IRON AND TIBC
Iron: 24 ug/dL — ABNORMAL LOW (ref 41–142)
Saturation Ratios: 12 % — ABNORMAL LOW (ref 21–57)
TIBC: 196 ug/dL — ABNORMAL LOW (ref 236–444)
UIBC: 172 ug/dL

## 2017-08-14 LAB — SAVE SMEAR

## 2017-08-14 NOTE — Progress Notes (Signed)
Hematology/Oncology Consultation   Name: [name]      MRN: [id number]    Location: Room/bed info not found  Date: 08/14/2017 Time:10:00 AM   REFERRING PHYSICIAN: Roma Schanz, MD  REASON FOR CONSULT: Anemia    DIAGNOSIS: Iron deficiency anemia   HISTORY OF PRESENT ILLNESS: [name] is a very pleasant 72 yo caucasian female with iron deficiency anemia. Recent iron saturation was 5.6% and ferritin 712. Hgb today is 8.9, MCV 89, platelet count 388.  She is an old patient of Dr. Antonieta Pert and has received IV iron in the past.  She is symptomatic at this time with fatigue, weakness, increased SOB, chills, no appetite, mouth ulcers and headaches.  She did have a fall last week due to weakness and her "legs gave out." Thankfully she states she was not seriously injured. She is using a walker today for support.  She is a patient of Dr. Carlean Purl (GI) and he felt that she would benefit from hematology referral and iron.  She has COPD and is on 3 L  24 hours a day.  She had pneumonia over the winter and was treated with antibiotics. She has a cough today and states when she coughs hard she has phlegm. Her lung sounds are diminished in the right lower lobe. She plans to follow-up with her PCP for further evaluation.  No fever, rash, dizziness, chest pain, palpitations, abdominal pain or changes in bowel or bladder habits.  She has nausea at times due to GERD and takes Protonix daily.  She is on Eliquis daily for what was felt to be a TIA. She could not have an MRI to confirm because she has a pacemaker.  She has history of a bleed while on Pradaxa and had to have 2 units of blood.  No recent episodes of bleeding noted. She does bruise easily but not in excess.  She states that she has had 5 small bowel obstructions and several surgeries to correct.  She has personal and familial history of skin cancer. No melanoma.  No swelling, tenderness, numbness or tingling in her extremities at this  time. She will occasionally have numbness and tingling in her hands and feet.  She states that she is hydrating well. Her weight is stable.   ROS: All other 10 point review of systems is negative.   PAST MEDICAL HISTORY:   Past Medical History:  Diagnosis Date  . Anemia   . Anginal pain ([place])   . Arthritis    right hip  . Atrial fibrillation ([place])   . Blood transfusion   . CAD (coronary artery disease)    mild disease per cath in 2011  . CHF (congestive heart failure) ([place])   . Chronic abdominal pain   . Chronic nausea   . Chronic pain syndrome   . COPD (chronic obstructive pulmonary disease) (H[facility])    continuous O2- 2l  . Diverticulitis   . Elbow fracture, left aug 2012  . Elevated liver enzymes   . Fibromyalgia    COUPLE OF TIMES A YEAR-PT HAS EPISODES OF CONFUSION-USUALLY INVOLVES DAY/NIGHT REVERSAL AND EPISODES OF TWITCHING AND FALLS--SEES DR. Jacelyn Grip - NEUROLOGIST-LAST SEEN 07/10/11  . Gastroparesis   . GERD (gastroesophageal reflux disease)   . H/O: GI bleed    from Pradaxa  . Hepatitis    hx of in high school   . HTN (hypertension)    under control; has been on med. x "years"  . Hx MRSA infection   .  Hx of gastric ulcer   . Hx of stress fx aug 2011   right hip   . LBBB (left bundle branch block)   . Oxygen dependent    2 liters via nasal cannula at all times  . Pacemaker    CRT therapy; followed by Dr. Caryl Comes  . Pleural effusion     s/p right thoracentesis 03/09  . Pneumonia - 03/09, 12/09, 02/10, 06/10   MOST RECENT FEB 2013  . Primary dilated cardiomyopathy (HCC)    EF 45 to 50% per echo in Jan 2012  . RLS (restless legs syndrome)   . Silent aspiration   . Small bowel obstruction ([place])   . Urinary incontinence     -INTERSTIM IMPLANT NOT FUNCTIONING PER PT  . Venous embolism and thrombosis of subclavian vein ([place])    after pacemaker insertion Oct 2011  . Vitamin B12 deficiency     ALLERGIES: Allergies  Allergen Reactions  . Dabigatran Etexilate  Mesylate Other (See Comments)    INTERNAL BLEEDING-pradaxa  . Talwin [Pentazocine] Other (See Comments)    hallucinations  . Lactate Rash      MEDICATIONS:  Current Outpatient Medications on File Prior to Visit  Medication Sig Dispense Refill  . acetaminophen (TYLENOL) 325 MG tablet Take 2 tablets (650 mg total) by mouth every 4 (four) hours as needed.    [name] amLODipine (NORVASC) 5 MG tablet Take 1 tablet (5 mg total) by mouth daily. 90 tablet 1  . apixaban (ELIQUIS) 5 MG TABS tablet Take 1 tablet (5 mg total) by mouth 2 (two) times daily. 180 tablet 1  . atorvastatin (LIPITOR) 20 MG tablet Take 1 tablet (20 mg total) by mouth daily at 6 PM. 90 tablet 1  . carvedilol (COREG) 25 MG tablet TAKE 1 TABLET BY MOUTH TWICE DAILY (Patient taking differently: TAKE 1 TABLET (25mg ) BY MOUTH TWICE DAILY) 180 tablet 1  . furosemide (LASIX) 40 MG tablet TAKE ONE TABLET (40mg ) BY MOUTH IN THE MORNING 90 tablet 3  . gabapentin (NEURONTIN) 600 MG tablet Take 1 tablet (600 mg total) by mouth 3 (three) times daily.    . nitroGLYCERIN (NITROSTAT) 0.4 MG SL tablet Place 1 tablet (0.4 mg total) under the tongue every 5 (five) minutes as needed. CHEST PAIN 25 tablet 5  . oxyCODONE-acetaminophen (PERCOCET) 10-325 MG tablet Take 1 tablet by mouth every 4 (four) hours as needed. PAIN 120 tablet 0  . pantoprazole (PROTONIX) 40 MG tablet Take 1 tablet (40 mg total) by mouth 2 (two) times daily. 60 tablet 0  . polyethylene glycol (MIRALAX / GLYCOLAX) packet Take 17 g by mouth daily. 14 each 0  . promethazine (PHENERGAN) 25 MG tablet Take 1 tablet (25 mg total) by mouth every 8 (eight) hours as needed. for nausea 40 tablet 5  . SPIRIVA RESPIMAT 1.25 MCG/ACT AERS INHALE 2 PUFFS INTO THE LUNGS DAILY 1 Inhaler 5  . Tiotropium Bromide Monohydrate (SPIRIVA RESPIMAT) 1.25 MCG/ACT AERS Inhale 2 puffs into the lungs daily.    [name] tiZANidine (ZANAFLEX) 4 MG tablet Take 1 tablet (4 mg total) by mouth 3 (three) times daily as needed.  for muscle spams 90 tablet 2  . zolpidem (AMBIEN) 5 MG tablet 1 po qhs prn 30 tablet 2  . [DISCONTINUED] iron dextran complex (INFED) 50 MG/ML injection Please give infed infusion (no test dose needed) over 4 hours per pharmacy calculated dose. Ht: 5'2 Wt: 99 pounds Hgb: 12 1 mL 0   No current facility-administered medications  on file prior to visit.      PAST SURGICAL HISTORY Past Surgical History:  Procedure Laterality Date  . APPENDECTOMY    . CARDIAC CATHETERIZATION  04/08/2006, 11/16/2009  . CHOLECYSTECTOMY N/A 06/27/2017   Procedure: OPEN CHOLECYSTECTOMY;  Surgeon: Ralene Ok, MD;  Location: [facility];  Service: General;  Laterality: N/A;  . COLECTOMY     Intestinal resection (5times)  . COLECTOMY  02/04/2000   ex. lap., intra-abd. subtotal colectomy with ileosigmoid colon anastomosies and lysis of adhesions  . CYSTOSCOPY  11/11/2008  . CYSTOSCOPY W/ RETROGRADES  11/11/2008   right  . CYSTOSCOPY WITH INJECTION  04/30/2006   transurethral collagen injection; incision vaginal stenosis  . ERCP N/A 06/23/2017   Procedure: ENDOSCOPIC RETROGRADE CHOLANGIOPANCREATOGRAPHY (ERCP);  Surgeon: Carol Ada, MD;  Location: [facility];  Service: Endoscopy;  Laterality: N/A;  . ERCP N/A 06/24/2017   Procedure: ENDOSCOPIC RETROGRADE CHOLANGIOPANCREATOGRAPHY (ERCP);  Surgeon: [name], MD;  Location: [facility];  Service: Endoscopy;  Laterality: N/A;  . ESOPHAGOGASTRODUODENOSCOPY N/A 02/02/2013   Procedure: ESOPHAGOGASTRODUODENOSCOPY (EGD);  Surgeon: Lafayette Dragon, MD;  Location[facility];  Service: Endoscopy;  Laterality: N/A;  . EXPLORATORY LAPAROTOMY  04/27/2009   lysis of adhesions, gastrostomy tube  . GASTROCUTANEOUS FISTULA CLOSURE    . INTERSTIM IMPLANT PLACEMENT  05/28/2006 - stage I   06/05/2006 - stage II  . INTERSTIM IMPLANT PLACEMENT  02/05/2012   Procedure: [name] IMPLANT FIRST STAGE;  Surgeon: Reece Packer, MD;  [facility];  Service: Urology;  Laterality:  N/A;  Replacement of Interstim Lead     . INTERSTIM IMPLANT REVISION  03/06/2011   Procedure: REVISION OF [name];  Surgeon: Reece Packer, MD;  [facility];  Service: Urology;  Laterality: N/A;  Replacement of Neurostimulator  . INTERSTIM IMPLANT REVISION  10/23/2007  . PACEMAKER PLACEMENT  12/21/2009  . Peg removed     with complications  . PORT-A-CATH REMOVAL  07/27/2011   Procedure: REMOVAL PORT-A-CATH;  Surgeon: Rolm Bookbinder, MD;  Location: [place];  Service: General;  Laterality: N/A;  . PORTACATH PLACEMENT  12/16/2009  . SAVORY DILATION N/A 02/02/2013   Procedure: SAVORY DILATION;  Surgeon: Lafayette Dragon, MD;  [facility];  Service: Endoscopy;  Laterality: N/A;  . SMALL INTESTINE SURGERY  05/20/2001   ex. lap., resection of small bowel stricture; gastrostomy; insertion central line  . TONSILLECTOMY  age 22  . TOTAL ABDOMINAL HYSTERECTOMY     complete  . TOTAL HIP ARTHROPLASTY  08/03/2011   Procedure: TOTAL HIP ARTHROPLASTY ANTERIOR APPROACH;  Surgeon: Mcarthur Rossetti, MD;  [facility];  Service: Orthopedics;  Laterality: Right;    FAMILY HISTORY: Family History  Problem Relation Age of Onset  . Heart disease Father   . Stroke Mother   . Heart disease Mother   . Heart disease Brother   . Breast cancer Maternal Aunt   . Heart disease Paternal Aunt   . Diabetes Paternal Grandfather   . Colon cancer Neg Hx     SOCIAL HISTORY:  reports that she quit smoking about 31 years ago. She has a 20.00 pack-year smoking history. She has never used smokeless tobacco. She reports that she does not drink alcohol or use drugs.  PERFORMANCE STATUS: The patient's performance status is 2 - Symptomatic, <50% confined to bed  PHYSICAL EXAM: Most Recent Vital Signs: Blood pressure (!) 95/46, pulse 73, temperature (!) 97.4 F (36.3 C), temperature source Oral, resp. rate 16, weight 96  lb (43.5 kg), SpO2 98 %. BP (!) 95/46 (BP Location: Right  Arm, Patient Position: Sitting)   Pulse 73   Temp (!) 97.4 F (36.3 C) (Oral)   Resp 16   Wt 96 lb (43.5 kg)   SpO2 98%   BMI 18.75 kg/m   General Appearance:    Alert, cooperative, no distress, appears stated age  Head:    Normocephalic, without obvious abnormality, atraumatic  Eyes:    PERRL, conjunctiva/corneas clear, EOM's intact, fundi    benign, both eyes        Throat:   Lips, mucosa, and tongue normal; teeth and gums normal  Neck:   Supple, symmetrical, trachea midline, no adenopathy;    thyroid:  no enlargement/tenderness/nodules; no carotid   bruit or JVD  Back:     Symmetric, no curvature, ROM normal, no CVA tenderness  Lungs:     Lung sounds diminished in right lower lobe, clear to auscultation in right upper and left upper and lower, respirations unlabored  Chest Wall:    No tenderness or deformity   Heart:    Regular rate and rhythm, S1 and S2 normal, no murmur, rub   or gallop     Abdomen:     Soft, non-tender, bowel sounds active all four quadrants,    no masses, no organomegaly        Extremities:   Extremities normal, atraumatic, no cyanosis or edema  Pulses:   2+ and symmetric all extremities  Skin:   Skin color, texture, turgor normal, no rashes or lesions  Lymph nodes:   Cervical, supraclavicular, and axillary nodes normal  Neurologic:   CNII-XII intact, normal strength, sensation and reflexes    throughout    LABORATORY DATA:  Results for orders placed or performed in visit on 08/14/17 (from the past 48 hour(s))  CBC with Differential ([place] Only)     Status: Abnormal   Collection Time: 08/14/17  9:19 AM  Result Value Ref Range   WBC Count 10.2 (H) 3.9 - 10.0 K/uL   RBC 3.17 (L) 3.70 - 5.32 MIL/uL   Hemoglobin 8.9 (L) 11.6 - 15.9 g/dL   HCT 28.2 (L) 34.8 - 46.6 %   MCV 89.0 81.0 - 101.0 fL   MCH 28.1 26.0 - 34.0 pg   MCHC 31.6 (L) 32.0 - 36.0 g/dL   RDW 14.3 11.1 - 15.7 %   Platelet Count 388 145 - 400 K/uL   Neutrophils Relative % 84 %    Neutro Abs 8.6 (H) 1.5 - 6.5 K/uL   Lymphocytes Relative 6 %   Lymphs Abs 0.6 (L) 0.9 - 3.3 K/uL   Monocytes Relative 8 %   Monocytes Absolute 0.8 0.1 - 0.9 K/uL   Eosinophils Relative 2 %   Eosinophils Absolute 0.2 0.0 - 0.5 K/uL   Basophils Relative 0 %   Basophils Absolute 0.0 0.0 - 0.1 K/uL    Comment: Performed at [facility] at [facility], [address], [place], [place] [address]   *Note: Due to a large number of results and/or encounters for the requested time period, some results have not been displayed. A complete set of results can be found in Results Review.      RADIOGRAPHY: No results found.     PATHOLOGY: None  ASSESSMENT/PLAN: Ms. Savard is a very pleasant 72 yo caucasian female with iron deficiency anemia secondary to malabsorption and possible GI blood loss with anticoagulation. She is symptomatic  as mentioned above.  Iron saturation is 12% and ferritin 340 so we will give her 2 doses of IV iron.  Erythropoietin level is still pending.  We will go ahead and plan to see her back in another 6 weeks for follow-up.   All questions were answered she is in agreement with the plan. They will contact our office with any questions or concerns. We can certainly see her much sooner if necessary.  She was discussed with and also seen by Dr. Marin Olp and he is in agreement with the aforementioned.   Laverna Peace    Addendum: I saw and examined the patient with Judson Roch.  I have not seen her for about 10 years.  She does look a little more frail because of her underlying COPD.  She is oxygen dependent.  She clearly has iron deficiency anemia.  Her iron studies are consistent with this.  The ferritin is elevated because it is an acute phase reactant.  I did look at her blood smear.  She has some microcytic red blood cells.  I saw no nucleated red blood cells.  There were no teardrop cells.  I saw no blasts.  Platelets looked adequate.  She had  mature white blood cells with no hypersegmented polys.  I feel confident that IV iron will be able to improve her quality of life.  We spent about 40 minutes with her and her husband.  It was nice to see her again.  Again, I feel that we will be able to improve her quality of life with iron.  We will plan to see her back in about a month after she has the IV iron.  Lattie Haw, MD

## 2017-08-14 NOTE — Progress Notes (Signed)
_0  ID: [name], female    DOB: 1945-06-16, 72 y.o.   MRN: [id number]  Chief Complaint  Patient presents with  . Acute Visit    cough with thick mucous x 3 weeks. same symptoms prior to PNE in december. 3L pulsed continous.     Referring provider: Carollee Herter, Alferd Apa, *  HPI:  Referring provider: Ann Held, *  HPI: 72 year old female former smoker followed for COPD, emphysema, chronic hypoxic respiratory failure on oxygen, recurrent pneumonia with bronchiectasis Plaque psoriasis on methotrexate- stopped 03/2017   Recent [facility] Encounters:   08/14/2017-patient has been coughing for 3 weeks-telephone encounter-patient has been coughing for 3 weeks history of pneumonia does not want to develop pneumonia again scheduled for acute visit on 5/30  05/28/2017-office visit-sedated respiratory status is improving after stopping methotrexate.  Continue as needed flutter valve and Mucinex for your bronchiectasis, continue Spiriva, 2 to 3 L of oxygen continuously 24/7, continue follow-up with Dr. Renda Rolls for plaque psoriasis  04/22/2017-office visit-TP seen for 2-week follow-up worsening symptoms at 3 months of fatigue cough and congestion and recurrent infections.  Has been treated with several antibiotics sputum culture sputum AFB was sent.  Results have been negative to date.  A recent CT chest completed on January 10 showed mild to moderate bronchiectasis with tree-in-bud nodular infiltrates concerning for MAI.    Tests:  PFT4/12/10>>FEV1 1.95 (97%), FEV1% 70, TLC 3.88 (87%), DLCO 64% ONO 02/11/10 >> Test time 9 hrs 9 min, Mean SpO2 87%, low SpO2 80%, Spent 5hrs 57 min (65%) with SpO2 < 88%. PFT7/24/13>> FEV1 1.15 (59%), FEV1% 64, TLC 3.26 (73%), DLCO 35%, + BD  Imaging:  06/20/2017-chest x-ray- unchanged appearance of chronic interstitial opacities likely secondary to chronic mycobacterial infection 03/28/2017-CT chest with contrast- significant  interval progression of respiratory findings suggestive of chronic infectious bronchiolitis due to atypical Mycobacterium infection, diffuse cylindrical bronchiectasis, patchy tree-in-bud opacities and patchy nodular peribronchial vascular and peripheral fossae of consolidation, follow-up of high resolution CT chest may be considered in 6 to 12 months CT chest 04/30/2011 >> Chronic multifocal nodular and patchy airspace densities throughout the right lung with mild BTX  Cardiac:   Echo 05/18/14 >> EF 60 to 53%, grade 1 diastolic dysfx, PAS 30 mmHg 06/01/2017-echocardiogram-LV ejection fraction 55 to 61%, grade 1 diastolic dysfunction  Micro:  06/20/2017-blood cultures-no growth for 5 days 04/05/2017-sputum culture-mycobacteria negative 04/04/2017-respiratory sputum culture-growth of normal oropharyngeal flora  Chart Review:  06/19/2017-hospitalization-hypotension, acute cholecystitis  08/15/17 Acute  Patient presenting today with 3 weeks of cough.  Wanted to come in today with concerns of pneumonia. Patient was also reporting increasing fatigue but admits that this is probably multifactorial as patient has chronic anemia and is preparing to have iron infusions.  Patient spouse does state that she seems more tired and fatigued and that this has increased over the last 3 weeks.  Patient does have a productive cough but is unsure of the color the mucus is patient swallows it.  Chart review does show that last month in April/2019 patient was admitted to the hospital patient was given multiple antibiotics on that admission per patient.  Patient has not been using her flutter valve.  Patient presents office today on 3 L nasal cannula.  She states that they had increased her daily oxygen use to 3 L back in December/2018.   Allergies  Allergen Reactions  . Dabigatran Etexilate Mesylate Other (See Comments)    INTERNAL BLEEDING-pradaxa  . Talwin [Pentazocine]  Other (See Comments)    hallucinations  .  Lactate Rash    Immunization History  Administered Date(s) Administered  . DTaP 11/03/1996, 01/04/1997, 05/03/1997, 10/19/1997, 08/05/2001  . HPV Quadrivalent 09/05/2009, 09/13/2010, 09/26/2011  . Hepatitis A, Ped/Adol-2 Dose 09/21/2004, 03/29/2005  . Hepatitis B, ped/adol 04/30/1996, 11/03/1996, 10/19/1997  . HiB (PRP-T) 11/03/1996, 01/04/1997, 05/03/1997, 10/19/1997  . IPV 11/03/1996, 01/04/1997, 05/03/1997, 08/05/2001  . Influenza Split 01/16/2011, 12/12/2011  . Influenza Whole 01/15/2007, 12/08/2007, 12/28/2008, 12/17/2009  . Influenza, High Dose Seasonal PF 11/28/2015, 12/07/2016  . Influenza, Seasonal, Injecte, Preservative Fre 12/09/2012  . Influenza,Quad,Nasal, Live 03/17/2007, 03/06/2009, 01/08/2010, 01/30/2011, 02/09/2012, 01/18/2013  . Influenza,inj,Quad PF,6+ Mos 03/29/2005, 04/30/2005, 03/01/2006, 01/28/2008, 12/01/2013, 01/16/2014, 12/22/2014, 12/28/2014  . MMR 05/03/1997, 08/05/2001  . Meningococcal B, OMV 12/22/2014  . Pneumococcal Conjugate-13 06/10/2014, 11/15/2014  . Pneumococcal Polysaccharide-23 12/17/2009  . Tdap 09/17/2007, 05/15/2014  . Varicella 11/16/1997, 05/18/2008  . Zoster 12/31/2013    Past Medical History:  Diagnosis Date  . Anemia   . Anginal pain ([place])   . Arthritis    right hip  . Atrial fibrillation ([place])   . Blood transfusion   . CAD (coronary artery disease)    mild disease per cath in 2011  . CHF (congestive heart failure) ([place])   . Chronic abdominal pain   . Chronic nausea   . Chronic pain syndrome   . COPD (chronic obstructive pulmonary disease) (HCC)    continuous O2- 2l  . Diverticulitis   . Elbow fracture, left aug 2012  . Elevated liver enzymes   . Fibromyalgia    COUPLE OF TIMES A YEAR-PT HAS EPISODES OF CONFUSION-USUALLY INVOLVES DAY/NIGHT REVERSAL AND EPISODES OF TWITCHING AND FALLS--SEES DR. Jacelyn Grip - NEUROLOGIST-LAST SEEN 07/10/11  . Gastroparesis   . GERD (gastroesophageal reflux disease)   . H/O: GI bleed    from  Pradaxa  . Hepatitis    hx of in high school   . HTN (hypertension)    under control; has been on med. x "years"  . Hx MRSA infection   . Hx of gastric ulcer   . Hx of stress fx aug 2011   right hip   . Iron malabsorption 08/14/2017  . LBBB (left bundle branch block)   . Oxygen dependent    2 liters via nasal cannula at all times  . Pacemaker    CRT therapy; followed by Dr. Caryl Comes  . Pleural effusion     s/p right thoracentesis 03/09  . Pneumonia - 03/09, 12/09, 02/10, 06/10   MOST RECENT FEB 2013  . Primary dilated cardiomyopathy (HCC)    EF 45 to 50% per echo in Jan 2012  . RLS (restless legs syndrome)   . Silent aspiration   . Small bowel obstruction ([place])   . Urinary incontinence     -INTERSTIM IMPLANT NOT FUNCTIONING PER PT  . Venous embolism and thrombosis of subclavian vein ([place])    after pacemaker insertion Oct 2011  . Vitamin B12 deficiency     Tobacco History: Social History   Tobacco Use  Smoking Status Former Smoker  . Packs/day: 1.00  . Years: 20.00  . Pack years: 20.00  . Last attempt to quit: 03/19/1986  . Years since quitting: 31.4  Smokeless Tobacco Never Used   Counseling given: Yes Continues to not smoke  Outpatient Encounter Medications as of 08/15/2017  Medication Sig  . acetaminophen (TYLENOL) 325 MG tablet Take 2 tablets (650 mg total) by mouth every 4 (four) hours as needed.  [name]  amLODipine (NORVASC) 5 MG tablet Take 1 tablet (5 mg total) by mouth daily.  [name] apixaban (ELIQUIS) 5 MG TABS tablet Take 1 tablet (5 mg total) by mouth 2 (two) times daily.  [name] atorvastatin (LIPITOR) 20 MG tablet Take 1 tablet (20 mg total) by mouth daily at 6 PM.  . carvedilol (COREG) 25 MG tablet TAKE 1 TABLET BY MOUTH TWICE DAILY (Patient taking differently: TAKE 1 TABLET (23m) BY MOUTH TWICE DAILY)  . furosemide (LASIX) 40 MG tablet TAKE ONE TABLET (475m BY MOUTH IN THE MORNING  . gabapentin (NEURONTIN) 600 MG tablet Take 1 tablet (600 mg total) by mouth 3 (three)  times daily.  . nitrofurantoin, macrocrystal-monohydrate, (MACROBID) 100 MG capsule   . nitroGLYCERIN (NITROSTAT) 0.4 MG SL tablet Place 1 tablet (0.4 mg total) under the tongue every 5 (five) minutes as needed. CHEST PAIN  . oxyCODONE-acetaminophen (PERCOCET) 10-325 MG tablet Take 1 tablet by mouth every 4 (four) hours as needed. PAIN  . pantoprazole (PROTONIX) 40 MG tablet Take 1 tablet (40 mg total) by mouth 2 (two) times daily.  . polyethylene glycol (MIRALAX / GLYCOLAX) packet Take 17 g by mouth daily.  . promethazine (PHENERGAN) 25 MG tablet Take 1 tablet (25 mg total) by mouth every 8 (eight) hours as needed. for nausea  . SPIRIVA RESPIMAT 1.25 MCG/ACT AERS INHALE 2 PUFFS INTO THE LUNGS DAILY  . tiZANidine (ZANAFLEX) 4 MG tablet Take 1 tablet (4 mg total) by mouth 3 (three) times daily as needed. for muscle spams  . zolpidem (AMBIEN) 5 MG tablet 1 po qhs prn  . [DISCONTINUED] Tiotropium Bromide Monohydrate (SPIRIVA RESPIMAT) 1.25 MCG/ACT AERS Inhale 2 puffs into the lungs daily.  . Marland Kitchenmoxicillin-clavulanate (AUGMENTIN) 875-125 MG tablet Take 1 tablet by mouth 2 (two) times daily.  . predniSONE (DELTASONE) 10 MG tablet 4 tabs for 2 days, then 3 tabs for 2 days, 2 tabs for 2 days, then 1 tab for 2 days, then stop  . [DISCONTINUED] iron dextran complex (INFED) 50 MG/ML injection Please give infed infusion (no test dose needed) over 4 hours per pharmacy calculated dose. Ht: 5'2 Wt: 99 pounds Hgb: 12   No facility-administered encounter medications on file as of 08/15/2017.    *of note, is not currently taking Macrobid.  Has been out of it for the last 3 months.  Followed by urology.  Review of Systems  Constitutional: +fatigue  No  weight loss, night sweats,  fevers, chills HEENT:  +post nasal drip, HAs  No difficulty swallowing,  Tooth/dental problems, or  Sore throat, No sneezing, itching, ear ache CV: No chest pain,  orthopnea, PND, swelling in lower extremities, anasarca, dizziness,  palpitations, syncope  GI: +nausea, poor appetite, 7lb weight loss over past few months No heartburn, indigestion, abdominal pain, vomiting, diarrhea, change in bowel habits, bloody stools Resp: +sob with exertion, with productive cough  No shortness of breath at rest.  No excess mucus,  No non-productive cough,  No coughing up of blood.  No change in color of mucus.  No wheezing.  No chest wall deformity Skin: no rash, lesions, no skin changes. GU: no dysuria, change in color of urine, no urgency or frequency.  No flank pain, no hematuria  MS:  No joint pain or swelling.  No decreased range of motion.  No back pain. Psych:  No change in mood or affect. No depression or anxiety.  No memory loss.   Physical Exam  BP 122/80   Pulse 78  Temp 98 F (36.7 C) (Oral)   Ht 5' (1.524 m)   Wt 93 lb 12.8 oz (42.5 kg)   SpO2 97%   BMI 18.32 kg/m    Wt Readings from Last 3 Encounters:  08/15/17 93 lb 12.8 oz (42.5 kg)  08/14/17 96 lb (43.5 kg)  07/17/17 95 lb 4 oz (43.2 kg)    GEN: A/Ox3; pleasant , NAD, well nourished, chronically ill  HEENT:  [place]/AT,  EACs-clear, TMs-wnl, NOSE-clear, THROAT-+post nasal drip, no lesions NECK:  Supple w/ fair ROM; no JVD; no lymphadenopathy.   RESP:  +right base crackles/rales, good air movement in all lobes, minor right upper lobe expiratory wheeze no accessory muscle use, no dullness to percussion CARD:  RRR, no m/r/g, no peripheral edema, pulses intact, no cyanosis or clubbing. GI:   Soft & nt; nml bowel sounds; no organomegaly or masses detected.  Musco: Warm bil, no deformities or joint swelling noted.  Neuro: alert, no focal deficits noted.   Skin: Warm, no lesions or rashes    Lab Results:  CBC    Component Value Date/Time   WBC 10.2 (H) 08/14/2017 0919   WBC 9.1 07/23/2017 1018   RBC 3.17 (L) 08/14/2017 0919   RBC 3.21 (L) 08/14/2017 0919   HGB 8.9 (L) 08/14/2017 0919   HGB 10.7 (L) 04/01/2009 1048   HGB 11.8 05/01/2007 0845   HCT 28.2  (L) 08/14/2017 0919   HCT 32.4 (L) 04/01/2009 1048   HCT 37.7 05/01/2007 0845   PLT 388 08/14/2017 0919   PLT 420 (H) 04/01/2009 1048   PLT 264 05/01/2007 0845   MCV 89.0 08/14/2017 0919   MCV 83 04/01/2009 1048   MCV 87.8 05/01/2007 0845   MCH 28.1 08/14/2017 0919   MCHC 31.6 (L) 08/14/2017 0919   RDW 14.3 08/14/2017 0919   RDW 13.6 04/01/2009 1048   RDW 13.7 05/01/2007 0845   LYMPHSABS 0.6 (L) 08/14/2017 0919   LYMPHSABS 1.0 04/01/2009 1048   LYMPHSABS 0.6 (L) 05/01/2007 0845   MONOABS 0.8 08/14/2017 0919   MONOABS 0.5 05/01/2007 0845   EOSABS 0.2 08/14/2017 0919   EOSABS 0.1 04/01/2009 1048   BASOSABS 0.0 08/14/2017 0919   BASOSABS 0.0 04/01/2009 1048   BASOSABS 0.0 05/01/2007 0845    BMET    Component Value Date/Time   NA 139 08/14/2017 0919   K 4.4 08/14/2017 0919   CL 101 08/14/2017 0919   CO2 27 08/14/2017 0919   GLUCOSE 107 08/14/2017 0919   GLUCOSE 104 (H) 03/11/2006 1059   BUN 10 08/14/2017 0919   CREATININE 0.98 08/14/2017 0919   CREATININE 0.91 03/16/2016 1539   CALCIUM 8.6 08/14/2017 0919   GFRNONAA 56 (L) 08/14/2017 0919   GFRAA >60 08/14/2017 0919    BNP    Component Value Date/Time   BNP 174.8 (H) 06/20/2017 0351    ProBNP    Component Value Date/Time   PROBNP 70.8 06/26/2010 1355    Imaging: Dg Chest 2 View  Result Date: 08/15/2017 CLINICAL DATA:  Cough, congestion, sob x 3 wks, HTN, x-smoker, hx of emphysema/copd, bronchiectasis, pacemaker. EXAM: CHEST - 2 VIEW COMPARISON:  Chest radiographs, 06/20/2017 and 03/21/2017. Chest CT, 03/28/2017. FINDINGS: There is new focal opacity at the posterior base of the right lower lobe consistent with pneumonia. This is superimposed on chronic irregularly thickened interstitial markings, greater on the right than the left, felt likely to be due to chronic bronchiolitis from atypical infection. No pleural effusion.  No pneumothorax. Cardiac silhouette  is normal in size. Left anterior chest wall  biventricular cardioverter-defibrillator is stable. No mediastinal or hilar masses. No convincing adenopathy. Skeletal structures are grossly intact. IMPRESSION: 1. Acute right lower lobe pneumonia superimposed on the previously described chronic changes, which are greater on the right, suspected to be bronchiolitis due to atypical infection. Electronically Signed   By: Lajean Manes M.D.   On: 08/15/2017 15:16   Xr Hip Unilat W Or W/o Pelvis 2-3 Views Right  Result Date: 08/15/2017 AP pelvis lateral view of the right hip: No acute fractures.  Right total hip arthroplasty components well-seated without any evidence of complication.  Bilateral hips well located.  Xr Knee 1-2 Views Right  Result Date: 08/15/2017 AP lateral views right knee: No acute fractures.  Narrowing the medial lateral joint lines.  Knee is well located.    Assessment & Plan:   72 year old patient presents today with cough, worsening fatigue, concerns of recurrent pneumonia.  On assessment today patient has right lower lobe crackles and expiratory wheeze.  Probable COPD exacerbation versus potential pneumonia.  Will do chest x-ray today.  Will treat with Augmentin for 7 days (considered Levaquin but will hold off right now due to kidney functioning) as well as prednisone taper.  Patient to increase flutter valve use to at least 2 times a day.  Discussed at length the importance of using flutter valve with her bronchiectasis.  Patient to continue taking her Spiriva Respimat as prescribed.  Discussed with patient and spouse the importance of close follow-up with our office, as well as close monitoring patient is able to adequately intake fluid as well as eat nutritious meals.  If symptoms worsen, fevers develop, oxygen needs increase, or any concerns about her respiratory status, patient to follow-up with our office.  Oxygen dependent 3 L of oxygen continuously Notify severe oxygen needs are increasing Monitor oxygen saturation to  ensure oxygen saturations are above 90%  Chronic hypoxemic respiratory failure X-ray today Augmentin 850m twice a day for 7 days  >>> Take with food >>> Eat yogurt daily or start probiotic  Prednisone >>>4 tabs for 2 days, then 3 tabs for 2 days, 2 tabs for 2 days, then 1 tab for 2 days, then stop  Flutter Valve  >>>Use at least 2 times a day   COPD with emphysema (HCC) X-ray today Augmentin [id number] a day for 7 days  >>> Take with food >>> Eat yogurt daily or start probiotic  Prednisone >>>4 tabs for 2 days, then 3 tabs for 2 days, 2 tabs for 2 days, then 1 tab for 2 days, then stop  Flutter Valve  >>>Use at least 2 times a day   Bronchiectasis (HCC) X-ray today Augmentin [id number] a day for 7 days  >>> Take with food >>> Eat yogurt daily or start probiotic  Prednisone >>>4 tabs for 2 days, then 3 tabs for 2 days, 2 tabs for 2 days, then 1 tab for 2 days, then stop  Flutter Valve  >>>Use at least 2 times a day      BriLauraine RinneP 08/15/2017

## 2017-08-14 NOTE — Telephone Encounter (Signed)
Spoke with patient. She has been coughing for the past 3 weeks. She has a history of PNA and does not want this to develop into PNA again. Was able to get her scheduled with Aaron Edelman for tomorrow at 245pm since Dr. Halford Chessman will not be here.   She is aware of appointment. Nothing else needed at time of call.

## 2017-08-15 ENCOUNTER — Encounter (INDEPENDENT_AMBULATORY_CARE_PROVIDER_SITE_OTHER): Payer: Self-pay | Admitting: Physician Assistant

## 2017-08-15 ENCOUNTER — Telehealth: Payer: Self-pay | Admitting: Pulmonary Disease

## 2017-08-15 ENCOUNTER — Ambulatory Visit (INDEPENDENT_AMBULATORY_CARE_PROVIDER_SITE_OTHER)
Admission: RE | Admit: 2017-08-15 | Discharge: 2017-08-15 | Disposition: A | Discharge status: Home / Self Care | Payer: PPO | Source: Ambulatory Visit | Attending: Pulmonary Disease | Admitting: Pulmonary Disease

## 2017-08-15 ENCOUNTER — Ambulatory Visit (INDEPENDENT_AMBULATORY_CARE_PROVIDER_SITE_OTHER): Payer: PPO

## 2017-08-15 ENCOUNTER — Other Ambulatory Visit: Payer: Self-pay

## 2017-08-15 ENCOUNTER — Ambulatory Visit: Payer: PPO | Admitting: Pulmonary Disease

## 2017-08-15 ENCOUNTER — Encounter: Payer: Self-pay | Admitting: Pulmonary Disease

## 2017-08-15 ENCOUNTER — Ambulatory Visit (INDEPENDENT_AMBULATORY_CARE_PROVIDER_SITE_OTHER): Payer: PPO | Admitting: Physician Assistant

## 2017-08-15 VITALS — BP 122/80 | HR 78 | Temp 98.0°F | Ht 60.0 in | Wt 93.8 lb

## 2017-08-15 DIAGNOSIS — J479 Bronchiectasis, uncomplicated: Secondary | ICD-10-CM

## 2017-08-15 DIAGNOSIS — M25561 Pain in right knee: Secondary | ICD-10-CM

## 2017-08-15 DIAGNOSIS — J9611 Chronic respiratory failure with hypoxia: Secondary | ICD-10-CM

## 2017-08-15 DIAGNOSIS — R05 Cough: Secondary | ICD-10-CM | POA: Diagnosis not present

## 2017-08-15 DIAGNOSIS — J439 Emphysema, unspecified: Secondary | ICD-10-CM

## 2017-08-15 DIAGNOSIS — M25551 Pain in right hip: Secondary | ICD-10-CM

## 2017-08-15 DIAGNOSIS — Z9981 Dependence on supplemental oxygen: Secondary | ICD-10-CM

## 2017-08-15 LAB — ERYTHROPOIETIN: Erythropoietin: 28.5 m[IU]/mL — ABNORMAL HIGH (ref 2.6–18.5)

## 2017-08-15 MED ORDER — PREDNISONE 10 MG PO TABS: ORAL_TABLET | ORAL | 0 refills | Status: DC

## 2017-08-15 MED ORDER — AMOXICILLIN-POT CLAVULANATE 875-125 MG PO TABS: 1.0000 | ORAL_TABLET | Freq: Two times a day (BID) | ORAL | 0 refills | Status: DC

## 2017-08-15 MED ORDER — LEVOFLOXACIN 750 MG PO TABS
750.0000 mg | ORAL_TABLET | Freq: Every day | ORAL | 0 refills | Status: AC
Start: 2017-08-15 — End: 2017-08-22

## 2017-08-15 NOTE — Progress Notes (Signed)
As we suspected at your appointment today your chest x-ray is revealing a right lower lobe pneumonia.  We are actually going to switch her antibiotic to the Levaquin 750mg  daily for 7 days.  No other changes to your plan of care at this time.  This should eradicate any sort of infection, as well as taking to account the fact that you had multiple antibiotics last month when you are admitted to the hospital, and your chronic respiratory status with bronchiectasis and COPD.  It was a pleasure taking care of you today.  If you have any questions or concerns please feel free to follow-up with our office.   Wyn Quaker FNP

## 2017-08-15 NOTE — Patient Instructions (Addendum)
X-ray today Augmentin 875mg  twice a day for 7 days  >>> Take with food >>> Eat yogurt daily or start probiotic  Prednisone >>>4 tabs for 2 days, then 3 tabs for 2 days, 2 tabs for 2 days, then 1 tab for 2 days, then stop  Flutter Valve  >>>Use at least 2 times a day   Please contact the office if your symptoms worsen or you have concerns that you are not improving.   Thank you for choosing [facility] for your healthcare, and for allowing [place] to partner with you on your healthcare journey. I am thankful to be able to provide care to you today.   Wyn Quaker FNP-C

## 2017-08-15 NOTE — Assessment & Plan Note (Signed)
X-ray today Augmentin 875mg  twice a day for 7 days  >>> Take with food >>> Eat yogurt daily or start probiotic  Prednisone >>>4 tabs for 2 days, then 3 tabs for 2 days, 2 tabs for 2 days, then 1 tab for 2 days, then stop  Flutter Valve  >>>Use at least 2 times a day

## 2017-08-15 NOTE — Progress Notes (Signed)
In addition to this antibiotic change.  Will also need to bring him back in 3 to 4 weeks for follow-up appointment as well as a follow-up chest x-ray to ensure that it is resolving.  If you have any questions or concerns about this please feel free to follow-up with [place]  Wyn Quaker

## 2017-08-15 NOTE — Telephone Encounter (Signed)
Informed patient that we were changing her antibioic, originally order was placed for augmentin, after getting chest xray results NP Mack changed to Levaquin 750mg  qd x 7 days. Pharmacy phoned and made aware to cancel augmentin. No further questions at this time.

## 2017-08-15 NOTE — Progress Notes (Signed)
Office Visit Note   Patient: [name]           Date of Birth: 03-Aug-1945           MRN: [id number] Visit Date: 08/15/2017              Requested by: [address], [place], [place] [address] [place], [place] [address] PCP: Carollee Herter, Alferd Apa, DO   Assessment & Plan: Visit Diagnoses:  1. Pain in right hip   2. Acute pain of right knee     Plan: Discussed with her that she could actually have bruised bone at the hip of the knee that may take 3 months to totally dissipate.  However if her pain does not resolve or becomes worse she will follow-up with [place].  Otherwise follow-up PRN.  She may require a greater trochanteric injection on the right knee in the future.  Follow-Up Instructions: Return if symptoms worsen or fail to improve.   Orders:  Orders Placed This Encounter  Procedures  . XR HIP UNILAT W OR W/O PELVIS 2-3 VIEWS RIGHT  . XR Knee 1-2 Views Right   No orders of the defined types were placed in this encounter.     Procedures: No procedures performed   Clinical Data: No additional findings.   Subjective: Chief Complaint  Patient presents with  . Right Hip - Pain    H/o right total hip arthroplasty  . Right Knee - Pain    HPI [name] comes in today due to right hip pain and right knee pain.  She states 2 weeks ago after being sick for prolonged period of time she collapsed.  She states that she fell this straight down.  She did not hit her head she did not lose consciousness she had no dizziness or chest pain.  She is on chronic oxygen.  She did see a hematologist and she states that her iron levels were low.  She had no pain in the right hip or right knee initially.  She is now developed right groin pain and lateral knee pain.  She is ambulating without any assistive device. Review of Systems Denies chest pain, LOC, dizziness, head injury . Please see HPI otherwise negative   Objective: Vital Signs: There were no vitals taken for  this visit.  Physical Exam  Constitutional: She is oriented to person, place, and time. She appears well-developed and well-nourished. No distress.  Neurological: She is alert and oriented to person, place, and time.  Skin: She is not diaphoretic.  Psychiatric: She has a normal mood and affect.    Ortho Exam Good range of motion right hip discomfort with internal and external rotation. Tenderness over right greater trochanteric region.   Right knee no effusion, erythema, abnormal warmth or ecchymosis. Good range of motion right knee, tenderness lateral joint line and no instability with valgus or varus stressing.  Specialty Comments:  No specialty comments available.  Imaging: Xr Hip Unilat W Or W/o Pelvis 2-3 Views Right  Result Date: 08/15/2017 AP pelvis lateral view of the right hip: No acute fractures.  Right total hip arthroplasty components well-seated without any evidence of complication.  Bilateral hips well located.  Xr Knee 1-2 Views Right  Result Date: 08/15/2017 AP lateral views right knee: No acute fractures.  Narrowing the medial lateral joint lines.  Knee is well located.    PMFS History: Patient Active Problem List   Diagnosis Date Noted  .  Iron deficiency anemia due to chronic blood loss 08/14/2017  . Iron malabsorption 08/14/2017  . Hyperlipidemia LDL goal <100 07/08/2017  . Elevated ALT measurement   . Hypotension 06/20/2017  . HLD (hyperlipidemia) 06/20/2017  . CAD (coronary artery disease) 06/20/2017  . Protein-calorie malnutrition, severe ([place]) 06/20/2017  . Generalized weakness   . Weakness 05/30/2017  . Focal neurological deficit 05/30/2017  . Chronic diastolic CHF (congestive heart failure) ([place]) 05/30/2017  . TIA (transient ischemic attack) 05/30/2017  . AKI (acute kidney injury) ([place]) 05/30/2017  . Abnormal LFTs 05/30/2017  . Bronchiectasis ([place]) 04/22/2017  . Right otitis media 07/17/2016  . Cerumen impaction 07/17/2016  . Osteoporosis  03/16/2016  . Headache 11/20/2014  . Skin infection 05/27/2014  . Insomnia 05/27/2014  . Abdominal wall pain 07/09/2013  . Other dysphagia 02/02/2013  . Platelets decreased ([place]) 01/29/2012  . Vitamin D deficiency 12/13/2011  . Hypokalemia 12/13/2011  . Hypocalcemia 12/12/2011  . Adhesions of intestine, very dense, with frozen abdomen 12/11/2011  . SBO (small bowel obstruction), recurrent 12/11/2011  . Protein-calorie malnutrition, moderate ([place]) 12/11/2011  . Oxygen dependent   . Hx of gastric ulcer   . UTI (urinary tract infection) 10/15/2011  . Allergic conjunctivitis 10/15/2011  . Degenerative arthritis of hip 08/03/2011  . B12 deficiency 07/02/2011  . Bilateral leg weakness 01/16/2011  . Anemia, chronic disease 09/14/2010  . Recurrent pneumonia 08/11/2010  . CRT- [employer] 06/26/2010  . DYSURIA 05/19/2010  . Chronic hypoxemic respiratory failure ([place]) 02/21/2010  . DEHYDRATION 01/06/2010  . GASTROENTERITIS 01/06/2010  . Congestive dilated cardiomyopathy ([place]) 11/17/2009  . LBBB 11/17/2009  . SYSTOLIC HEART FAILURE, CHRONIC 11/17/2009  . GERD (gastroesophageal reflux disease) 02/17/2009  . NONSPECIFIC ABN FINDNG RAD&OTH EXAM BILARY TRCT 09/03/2008  . COPD with emphysema ([place]) 06/28/2008  . DIVERTICULOSIS, COLON 06/03/2008  . GASTRITIS 06/02/2008  . Atrial fibrillation/flutter 12/08/2007  . GASTROPARESIS 08/07/2007  . NAUSEA, CHRONIC 08/07/2007  . Abdominal pain 08/07/2007  . SYNCOPE 02/17/2007  . RESTLESS LEG SYNDROME 08/01/2006  . Chronic pain syndrome 08/01/2006  . OSTEOARTHRITIS 07/31/2006  . [place] DISEASE 07/31/2006  . FIBROMYALGIA 07/31/2006  . CHRONIC FATIGUE SYNDROME 07/31/2006  . URINARY INCONTINENCE 07/31/2006  . ANEMIA-IRON DEFICIENCY 05/01/2006  . Essential hypertension 05/01/2006   Past Medical History:  Diagnosis Date  . Anemia   . Anginal pain ([place])   . Arthritis    right hip  . Atrial fibrillation ([place])   . Blood transfusion   .  CAD (coronary artery disease)    mild disease per cath in 2011  . CHF (congestive heart failure) ([place])   . Chronic abdominal pain   . Chronic nausea   . Chronic pain syndrome   . COPD (chronic obstructive pulmonary disease) ([facility])    continuous O2- 2l  . Diverticulitis   . Elbow fracture, left aug 2012  . Elevated liver enzymes   . Fibromyalgia    COUPLE OF TIMES A YEAR-PT HAS EPISODES OF CONFUSION-USUALLY INVOLVES DAY/NIGHT REVERSAL AND EPISODES OF TWITCHING AND FALLS--SEES DR. Jacelyn Grip - NEUROLOGIST-LAST SEEN 07/10/11  . Gastroparesis   . GERD (gastroesophageal reflux disease)   . H/O: GI bleed    from Pradaxa  . Hepatitis    hx of in high school   . HTN (hypertension)    under control; has been on med. x "years"  . Hx MRSA infection   . Hx of gastric ulcer   . Hx of stress fx aug 2011   right hip   .  Iron malabsorption 08/14/2017  . LBBB (left bundle branch block)   . Oxygen dependent    2 liters via nasal cannula at all times  . Pacemaker    CRT therapy; followed by Dr. Caryl Comes  . Pleural effusion     s/p right thoracentesis 03/09  . Pneumonia - 03/09, 12/09, 02/10, 06/10   MOST RECENT FEB 2013  . Primary dilated cardiomyopathy ([facility])    EF 45 to 50% per echo in Jan 2012  . RLS (restless legs syndrome)   . Silent aspiration   . Small bowel obstruction ([place])   . Urinary incontinence     -INTERSTIM IMPLANT NOT FUNCTIONING PER PT  . Venous embolism and thrombosis of subclavian vein ([place])    after pacemaker insertion Oct 2011  . Vitamin B12 deficiency     Family History  Problem Relation Age of Onset  . Heart disease Father   . Stroke Mother   . Heart disease Mother   . Heart disease Brother   . Breast cancer Maternal Aunt   . Heart disease Paternal Aunt   . Diabetes Paternal Grandfather   . Colon cancer Neg Hx     Past Surgical History:  Procedure Laterality Date  . APPENDECTOMY    . CARDIAC CATHETERIZATION  04/08/2006, 11/16/2009  . CHOLECYSTECTOMY N/A 06/27/2017    Procedure: OPEN CHOLECYSTECTOMY;  Surgeon: Ralene Ok, MD;  Location: [facility];  Service: General;  Laterality: N/A;  . COLECTOMY     Intestinal resection (5times)  . COLECTOMY  02/04/2000   ex. lap., intra-abd. subtotal colectomy with ileosigmoid colon anastomosies and lysis of adhesions  . CYSTOSCOPY  11/11/2008  . CYSTOSCOPY W/ RETROGRADES  11/11/2008   right  . CYSTOSCOPY WITH INJECTION  04/30/2006   transurethral collagen injection; incision vaginal stenosis  . ERCP N/A 06/23/2017   Procedure: ENDOSCOPIC RETROGRADE CHOLANGIOPANCREATOGRAPHY (ERCP);  Surgeon: Carol Ada, MD;  Location: [facility];  Service: Endoscopy;  Laterality: N/A;  . ERCP N/A 06/24/2017   Procedure: ENDOSCOPIC RETROGRADE CHOLANGIOPANCREATOGRAPHY (ERCP);  Surgeon: [name], MD;  [facility];  Service: Endoscopy;  Laterality: N/A;  . ESOPHAGOGASTRODUODENOSCOPY N/A 02/02/2013   Procedure: ESOPHAGOGASTRODUODENOSCOPY (EGD);  Surgeon: Lafayette Dragon, MD;  Location: [facility];  Service: Endoscopy;  Laterality: N/A;  . EXPLORATORY LAPAROTOMY  04/27/2009   lysis of adhesions, gastrostomy tube  . GASTROCUTANEOUS FISTULA CLOSURE    . INTERSTIM IMPLANT PLACEMENT  05/28/2006 - stage I   06/05/2006 - stage II  . INTERSTIM IMPLANT PLACEMENT  02/05/2012   Procedure: [name] IMPLANT FIRST STAGE;  Surgeon: Reece Packer, MD;  Location: [facility];  Service: Urology;  Laterality: N/A;  Replacement of Interstim Lead     . INTERSTIM IMPLANT REVISION  03/06/2011   Procedure: REVISION OF [name];  Surgeon: Reece Packer, MD;  Location: [facility];  Service: Urology;  Laterality: N/A;  Replacement of Neurostimulator  . INTERSTIM IMPLANT REVISION  10/23/2007  . PACEMAKER PLACEMENT  12/21/2009  . Peg removed     with complications  . PORT-A-CATH REMOVAL  07/27/2011   Procedure: REMOVAL PORT-A-CATH;  Surgeon: Rolm Bookbinder, MD;  Location: [facility];  Service: General;  Laterality: N/A;  . PORTACATH  PLACEMENT  12/16/2009  . SAVORY DILATION N/A 02/02/2013   Procedure: SAVORY DILATION;  Surgeon: Lafayette Dragon, MD;  Location: [facility];  Service: Endoscopy;  Laterality: N/A;  . SMALL INTESTINE SURGERY  05/20/2001   ex. lap., resection of small bowel stricture; gastrostomy; insertion central line  .  TONSILLECTOMY  age 79  . TOTAL ABDOMINAL HYSTERECTOMY     complete  . TOTAL HIP ARTHROPLASTY  08/03/2011   Procedure: TOTAL HIP ARTHROPLASTY ANTERIOR APPROACH;  Surgeon: Mcarthur Rossetti, MD;  Location: [facility];  Service: Orthopedics;  Laterality: Right;   Social History   Occupational History  . Occupation: disabled    Employer: RETIRED  Tobacco Use  . Smoking status: Former Smoker    Packs/day: 1.00    Years: 20.00    Pack years: 20.00    Last attempt to quit: 03/19/1986    Years since quitting: 31.4  . Smokeless tobacco: Never Used  Substance and Sexual Activity  . Alcohol use: No    Alcohol/week: 0.0 oz  . Drug use: No  . Sexual activity: Yes

## 2017-08-15 NOTE — Assessment & Plan Note (Signed)
3 L of oxygen continuously Notify severe oxygen needs are increasing Monitor oxygen saturation to ensure oxygen saturations are above 90%

## 2017-08-16 NOTE — Progress Notes (Signed)
Reviewed and agree with assessment/plan.   Dashanique Brownstein, MD [facility] 03/14/2016, 12:24 PM Pager:  [phone]  

## 2017-08-19 ENCOUNTER — Other Ambulatory Visit: Payer: Self-pay | Admitting: Family Medicine

## 2017-08-19 DIAGNOSIS — R52 Pain, unspecified: Secondary | ICD-10-CM

## 2017-08-19 NOTE — Telephone Encounter (Signed)
Copied from [place] [id number]. Topic: Quick Communication - Rx Refill/Question >> Aug 19, 2017 11:02 AM Marin Olp L wrote: Medication: oxyCODONE-acetaminophen (PERCOCET) 10-325 MG tablet   Has the patient contacted their pharmacy? Yes.   (Agent: If no, request that the patient contact the pharmacy for the refill.) (Agent: If yes, when and what did the pharmacy advise?)  Preferred Pharmacy (with phone number or street name): [facility], [place] [address] Phone: [phone] Fax: [phone]  Agent: Please be advised that RX refills may take up to 3 business days. We ask that you follow-up with your pharmacy.

## 2017-08-20 NOTE — Telephone Encounter (Signed)
Percocet refill Last OV:07/08/17 Last refill:07/22/17 120 tab/0 refill EYC:[id number] Pharmacy: [place], [place] [phone] (Phone) [phone] (Fax)

## 2017-08-21 ENCOUNTER — Ambulatory Visit (INDEPENDENT_AMBULATORY_CARE_PROVIDER_SITE_OTHER): Payer: PPO | Admitting: Physician Assistant

## 2017-08-21 MED ORDER — OXYCODONE-ACETAMINOPHEN 10-325 MG PO TABS: 1.0000 | ORAL_TABLET | ORAL | 0 refills | Status: DC | PRN

## 2017-08-21 NOTE — Telephone Encounter (Signed)
Requesting: PERCOCET Contract:03/07/17 UDS:03/07/17 Low risk Last OV: 07/08/17 Next OV: 09/05/17 Last Refill: 07/22/17   Please advise

## 2017-08-22 ENCOUNTER — Other Ambulatory Visit: Payer: Self-pay

## 2017-08-22 ENCOUNTER — Inpatient Hospital Stay: Payer: PPO | Attending: Family

## 2017-08-22 VITALS — BP 144/69 | HR 78 | Temp 98.4°F | Resp 18

## 2017-08-22 DIAGNOSIS — D508 Other iron deficiency anemias: Secondary | ICD-10-CM | POA: Diagnosis not present

## 2017-08-22 DIAGNOSIS — D500 Iron deficiency anemia secondary to blood loss (chronic): Secondary | ICD-10-CM

## 2017-08-22 DIAGNOSIS — K909 Intestinal malabsorption, unspecified: Secondary | ICD-10-CM | POA: Diagnosis not present

## 2017-08-22 MED ORDER — SODIUM CHLORIDE 0.9 % IV SOLN
Freq: Once | INTRAVENOUS | Status: AC
  Administered 2017-08-22: 12:00:00 via INTRAVENOUS

## 2017-08-22 MED ORDER — SODIUM CHLORIDE 0.9 % IV SOLN
510.0000 mg | Freq: Once | INTRAVENOUS | Status: AC
  Administered 2017-08-22: 510 mg via INTRAVENOUS
  Filled 2017-08-22: qty 17

## 2017-08-22 NOTE — Patient Instructions (Signed)

## 2017-08-23 ENCOUNTER — Other Ambulatory Visit: Payer: Self-pay | Admitting: Cardiology

## 2017-08-29 ENCOUNTER — Inpatient Hospital Stay: Payer: PPO

## 2017-08-29 VITALS — BP 132/50 | HR 68 | Resp 20

## 2017-08-29 DIAGNOSIS — D508 Other iron deficiency anemias: Secondary | ICD-10-CM | POA: Diagnosis not present

## 2017-08-29 DIAGNOSIS — D500 Iron deficiency anemia secondary to blood loss (chronic): Secondary | ICD-10-CM

## 2017-08-29 MED ORDER — SODIUM CHLORIDE 0.9 % IV SOLN
510.0000 mg | Freq: Once | INTRAVENOUS | Status: AC
  Administered 2017-08-29: 510 mg via INTRAVENOUS
  Filled 2017-08-29: qty 17

## 2017-08-29 MED ORDER — SODIUM CHLORIDE 0.9 % IV SOLN
Freq: Once | INTRAVENOUS | Status: AC
  Administered 2017-08-29: 10:00:00 via INTRAVENOUS

## 2017-08-29 NOTE — Patient Instructions (Signed)

## 2017-09-05 ENCOUNTER — Encounter: Payer: Self-pay | Admitting: Family Medicine

## 2017-09-05 ENCOUNTER — Ambulatory Visit (INDEPENDENT_AMBULATORY_CARE_PROVIDER_SITE_OTHER): Payer: PPO | Admitting: Family Medicine

## 2017-09-05 VITALS — BP 126/54 | HR 71 | Temp 97.7°F | Resp 16 | Ht 60.0 in | Wt 94.2 lb

## 2017-09-05 DIAGNOSIS — D638 Anemia in other chronic diseases classified elsewhere: Secondary | ICD-10-CM

## 2017-09-05 LAB — CBC WITH DIFFERENTIAL/PLATELET
Basophils Absolute: 0.1 10*3/uL (ref 0.0–0.1)
Basophils Relative: 1 % (ref 0.0–3.0)
Eosinophils Absolute: 0.2 10*3/uL (ref 0.0–0.7)
Eosinophils Relative: 2.6 % (ref 0.0–5.0)
HCT: 31.6 % — ABNORMAL LOW (ref 36.0–46.0)
Hemoglobin: 10.2 g/dL — ABNORMAL LOW (ref 12.0–15.0)
Lymphocytes Relative: 6.4 % — ABNORMAL LOW (ref 12.0–46.0)
Lymphs Abs: 0.5 10*3/uL — ABNORMAL LOW (ref 0.7–4.0)
MCHC: 32.4 g/dL (ref 30.0–36.0)
MCV: 88.3 fl (ref 78.0–100.0)
Monocytes Absolute: 0.8 10*3/uL (ref 0.1–1.0)
Monocytes Relative: 10 % (ref 3.0–12.0)
Neutro Abs: 6.3 10*3/uL (ref 1.4–7.7)
Neutrophils Relative %: 80 % — ABNORMAL HIGH (ref 43.0–77.0)
Platelets: 214 10*3/uL (ref 150.0–400.0)
RBC: 3.58 Mil/uL — ABNORMAL LOW (ref 3.87–5.11)
RDW: 18.4 % — ABNORMAL HIGH (ref 11.5–15.5)
WBC: 7.9 10*3/uL (ref 4.0–10.5)

## 2017-09-05 NOTE — Progress Notes (Signed)
Check labs.  Adjust meds prnCheck labs.  Adjust meds prn Patient ID: [name], female    DOB: Jun 04, 1945  Age: 72 y.o. MRN: [id number]    Subjective:  Subjective  HPI [name] presents for f/u bp and anemia.  Pt has had 2 infusions.    Review of Systems  Constitutional: Negative for activity change, appetite change, fatigue and unexpected weight change.  Respiratory: Negative for cough and shortness of breath.   Cardiovascular: Negative for chest pain and palpitations.  Psychiatric/Behavioral: Negative for behavioral problems and dysphoric mood. The patient is not nervous/anxious.     History Past Medical History:  Diagnosis Date  . Anemia   . Anginal pain ([place])   . Arthritis    right hip  . Atrial fibrillation ([place])   . Blood transfusion   . CAD (coronary artery disease)    mild disease per cath in 2011  . CHF (congestive heart failure) ([place])   . Chronic abdominal pain   . Chronic nausea   . Chronic pain syndrome   . COPD (chronic obstructive pulmonary disease) ([facility])    continuous O2- 2l  . Diverticulitis   . Elbow fracture, left aug 2012  . Elevated liver enzymes   . Fibromyalgia    COUPLE OF TIMES A YEAR-PT HAS EPISODES OF CONFUSION-USUALLY INVOLVES DAY/NIGHT REVERSAL AND EPISODES OF TWITCHING AND FALLS--SEES DR. Jacelyn Grip - NEUROLOGIST-LAST SEEN 07/10/11  . Gastroparesis   . GERD (gastroesophageal reflux disease)   . H/O: GI bleed    from Pradaxa  . Hepatitis    hx of in high school   . HTN (hypertension)    under control; has been on med. x "years"  . Hx MRSA infection   . Hx of gastric ulcer   . Hx of stress fx aug 2011   right hip   . Iron malabsorption 08/14/2017  . LBBB (left bundle branch block)   . Oxygen dependent    2 liters via nasal cannula at all times  . Pacemaker    CRT therapy; followed by Dr. Caryl Comes  . Pleural effusion     s/p right thoracentesis 03/09  . Pneumonia - 03/09, 12/09, 02/10, 06/10   MOST RECENT FEB 2013  . Primary  dilated cardiomyopathy (HCC)    EF 45 to 50% per echo in Jan 2012  . RLS (restless legs syndrome)   . Silent aspiration   . Small bowel obstruction ([place])   . Urinary incontinence     -INTERSTIM IMPLANT NOT FUNCTIONING PER PT  . Venous embolism and thrombosis of subclavian vein ([place])    after pacemaker insertion Oct 2011  . Vitamin B12 deficiency     She has a past surgical history that includes Appendectomy; pacemaker placement (12/21/2009); Peg removed; Tonsillectomy (age 15); Interstim implant revision (03/06/2011); Portacath placement (12/16/2009); Total abdominal hysterectomy; Interstim Implant placement (05/28/2006 - stage I); Interstim implant revision (10/23/2007); Cystoscopy with injection (04/30/2006); Small intestine surgery (05/20/2001); Cardiac catheterization (04/08/2006, 11/16/2009); Cystoscopy (11/11/2008); Cystoscopy w/ retrogrades (11/11/2008); Colectomy; Colectomy (02/04/2000); Gastrocutaneous fistula closure; Exploratory laparotomy (04/27/2009); Port-a-cath removal (07/27/2011); Total hip arthroplasty (08/03/2011); Interstim Implant placement (02/05/2012); Esophagogastroduodenoscopy (N/A, 02/02/2013); Savory dilation (N/A, 02/02/2013); ERCP (N/A, 06/23/2017); ERCP (N/A, 06/24/2017); and Cholecystectomy (N/A, 06/27/2017).   Her family history includes Breast cancer in her maternal aunt; Diabetes in her paternal grandfather; Heart disease in her brother, father, mother, and paternal aunt; Stroke in her mother.She reports that she quit smoking about 31 years ago. She has a 20.00 pack-year  smoking history. She has never used smokeless tobacco. She reports that she does not drink alcohol or use drugs.  Current Outpatient Medications on File Prior to Visit  Medication Sig Dispense Refill  . acetaminophen (TYLENOL) 325 MG tablet Take 2 tablets (650 mg total) by mouth every 4 (four) hours as needed.    [name] amLODipine (NORVASC) 5 MG tablet Take 1 tablet (5 mg total) by mouth daily. 90 tablet 1  . apixaban  (ELIQUIS) 5 MG TABS tablet Take 1 tablet (5 mg total) by mouth 2 (two) times daily. 180 tablet 1  . atorvastatin (LIPITOR) 20 MG tablet Take 1 tablet (20 mg total) by mouth daily at 6 PM. 90 tablet 1  . carvedilol (COREG) 25 MG tablet TAKE 1 TABLET BY MOUTH TWICE DAILY 180 tablet 1  . furosemide (LASIX) 40 MG tablet TAKE ONE TABLET (40mg ) BY MOUTH IN THE MORNING 90 tablet 3  . gabapentin (NEURONTIN) 600 MG tablet Take 1 tablet (600 mg total) by mouth 3 (three) times daily.    . nitroGLYCERIN (NITROSTAT) 0.4 MG SL tablet Place 1 tablet (0.4 mg total) under the tongue every 5 (five) minutes as needed. CHEST PAIN 25 tablet 5  . oxyCODONE-acetaminophen (PERCOCET) 10-325 MG tablet Take 1 tablet by mouth every 4 (four) hours as needed. PAIN 120 tablet 0  . pantoprazole (PROTONIX) 40 MG tablet Take 1 tablet (40 mg total) by mouth 2 (two) times daily. 60 tablet 0  . polyethylene glycol (MIRALAX / GLYCOLAX) packet Take 17 g by mouth daily. 14 each 0  . promethazine (PHENERGAN) 25 MG tablet Take 1 tablet (25 mg total) by mouth every 8 (eight) hours as needed. for nausea 40 tablet 5  . SPIRIVA RESPIMAT 1.25 MCG/ACT AERS INHALE 2 PUFFS INTO THE LUNGS DAILY 1 Inhaler 5  . tiZANidine (ZANAFLEX) 4 MG tablet Take 1 tablet (4 mg total) by mouth 3 (three) times daily as needed. for muscle spams 90 tablet 2  . zolpidem (AMBIEN) 5 MG tablet 1 po qhs prn 30 tablet 2  . [DISCONTINUED] iron dextran complex (INFED) 50 MG/ML injection Please give infed infusion (no test dose needed) over 4 hours per pharmacy calculated dose. Ht: 5'2 Wt: 99 pounds Hgb: 12 1 mL 0   No current facility-administered medications on file prior to visit.      Objective:  Objective  Physical Exam  Constitutional: She is oriented to person, place, and time. She appears well-developed and well-nourished.  HENT:  Head: Normocephalic and atraumatic.  Eyes: Conjunctivae and EOM are normal.  Neck: Normal range of motion. Neck supple. No JVD  present. Carotid bruit is not present. No thyromegaly present.  Cardiovascular: Normal rate, regular rhythm and normal heart sounds.  No murmur heard. Pulmonary/Chest: Effort normal and breath sounds normal. No respiratory distress. She has no wheezes. She has no rales. She exhibits no tenderness.  Musculoskeletal: She exhibits no edema.  Neurological: She is alert and oriented to person, place, and time.  Psychiatric: She has a normal mood and affect.  Nursing note and vitals reviewed.  BP (!) 126/54 (BP Location: Right Arm, Cuff Size: Normal)   Pulse 71   Temp 97.7 F (36.5 C) (Oral)   Resp 16   Ht 5' (1.524 m)   Wt 94 lb 3.2 oz (42.7 kg)   SpO2 100% Comment: on  3 liters of O2  BMI 18.40 kg/m  Wt Readings from Last 3 Encounters:  09/05/17 94 lb 3.2 oz (42.7 kg)  08/15/17  93 lb 12.8 oz (42.5 kg)  08/14/17 96 lb (43.5 kg)     Lab Results  Component Value Date   WBC 7.9 09/05/2017   HGB 10.2 (L) 09/05/2017   HCT 31.6 (L) 09/05/2017   PLT 214.0 09/05/2017   GLUCOSE 107 08/14/2017   CHOL 140 05/31/2017   TRIG 67 05/31/2017   HDL 43 05/31/2017   LDLCALC 84 05/31/2017   ALT 10 08/14/2017   AST 17 08/14/2017   NA 139 08/14/2017   K 4.4 08/14/2017   CL 101 08/14/2017   CREATININE 0.98 08/14/2017   BUN 10 08/14/2017   CO2 27 08/14/2017   TSH 4.43 06/26/2010   INR 2.28 06/19/2017   HGBA1C 5.2 05/31/2017    Dg Chest 2 View  Result Date: 08/15/2017 CLINICAL DATA:  Cough, congestion, sob x 3 wks, HTN, x-smoker, hx of emphysema/copd, bronchiectasis, pacemaker. EXAM: CHEST - 2 VIEW COMPARISON:  Chest radiographs, 06/20/2017 and 03/21/2017. Chest CT, 03/28/2017. FINDINGS: There is new focal opacity at the posterior base of the right lower lobe consistent with pneumonia. This is superimposed on chronic irregularly thickened interstitial markings, greater on the right than the left, felt likely to be due to chronic bronchiolitis from atypical infection. No pleural effusion.  No  pneumothorax. Cardiac silhouette is normal in size. Left anterior chest wall biventricular cardioverter-defibrillator is stable. No mediastinal or hilar masses. No convincing adenopathy. Skeletal structures are grossly intact. IMPRESSION: 1. Acute right lower lobe pneumonia superimposed on the previously described chronic changes, which are greater on the right, suspected to be bronchiolitis due to atypical infection. Electronically Signed   By: Lajean Manes M.D.   On: 08/15/2017 15:16   Xr Hip Unilat W Or W/o Pelvis 2-3 Views Right  Result Date: 08/15/2017 AP pelvis lateral view of the right hip: No acute fractures.  Right total hip arthroplasty components well-seated without any evidence of complication.  Bilateral hips well located.  Xr Knee 1-2 Views Right  Result Date: 08/15/2017 AP lateral views right knee: No acute fractures.  Narrowing the medial lateral joint lines.  Knee is well located.    Assessment & Plan:  Plan  I am having [name] maintain her promethazine, nitroGLYCERIN, gabapentin, pantoprazole, acetaminophen, polyethylene glycol, SPIRIVA RESPIMAT, zolpidem, tiZANidine, amLODipine, apixaban, atorvastatin, furosemide, oxyCODONE-acetaminophen, and carvedilol.  No orders of the defined types were placed in this encounter.   Problem List Items Addressed This Visit      Unprioritized   Anemia, chronic disease - Primary   Relevant Orders   CBC with Differential/Platelet (Completed)     f/u hematology C/o iron infusions per heme  Follow-up: Return in about 3 months (around 12/06/2017).  Ann Held, DO

## 2017-09-05 NOTE — Patient Instructions (Signed)
Anemia Anemia is a condition in which you do not have enough red blood cells or hemoglobin. Hemoglobin is a substance in red blood cells that carries oxygen. When you do not have enough red blood cells or hemoglobin (are anemic), your body cannot get enough oxygen and your organs may not work properly. As a result, you may feel very tired or have other problems. What are the causes? Common causes of anemia include:  Excessive bleeding. Anemia can be caused by excessive bleeding inside or outside the body, including bleeding from the intestine or from periods in women.  Poor nutrition.  Long-lasting (chronic) kidney, thyroid, and liver disease.  Bone marrow disorders.  Cancer and treatments for cancer.  HIV (human immunodeficiency virus) and AIDS (acquired immunodeficiency syndrome).  Treatments for HIV and AIDS.  Spleen problems.  Blood disorders.  Infections, medicines, and autoimmune disorders that destroy red blood cells.  What are the signs or symptoms? Symptoms of this condition include:  Minor weakness.  Dizziness.  Headache.  Feeling heartbeats that are irregular or faster than normal (palpitations).  Shortness of breath, especially with exercise.  Paleness.  Cold sensitivity.  Indigestion.  Nausea.  Difficulty sleeping.  Difficulty concentrating.  Symptoms may occur suddenly or develop slowly. If your anemia is mild, you may not have symptoms. How is this diagnosed? This condition is diagnosed based on:  Blood tests.  Your medical history.  A physical exam.  Bone marrow biopsy.  Your health care provider may also check your stool (feces) for blood and may do additional testing to look for the cause of your bleeding. You may also have other tests, including:  Imaging tests, such as a CT scan or MRI.  Endoscopy.  Colonoscopy.  How is this treated? Treatment for this condition depends on the cause. If you continue to lose a lot of blood,  you may need to be treated at a hospital. Treatment may include:  Taking supplements of iron, vitamin B12, or folic acid.  Taking a hormone medicine (erythropoietin) that can help to stimulate red blood cell growth.  Having a blood transfusion. This may be needed if you lose a lot of blood.  Making changes to your diet.  Having surgery to remove your spleen.  Follow these instructions at home:  Take over-the-counter and prescription medicines only as told by your health care provider.  Take supplements only as told by your health care provider.  Follow any diet instructions that you were given.  Keep all follow-up visits as told by your health care provider. This is important. Contact a health care provider if:  You develop new bleeding anywhere in the body. Get help right away if:  You are very weak.  You are short of breath.  You have pain in your abdomen or chest.  You are dizzy or feel faint.  You have trouble concentrating.  You have bloody or black, tarry stools.  You vomit repeatedly or you vomit up blood. Summary  Anemia is a condition in which you do not have enough red blood cells or enough of a substance in your red blood cells that carries oxygen (hemoglobin).  Symptoms may occur suddenly or develop slowly.  If your anemia is mild, you may not have symptoms.  This condition is diagnosed with blood tests as well as a medical history and physical exam. Other tests may be needed.  Treatment for this condition depends on the cause of the anemia. This information is not intended to replace advice   given to you by your health care provider. Make sure you discuss any questions you have with your health care provider. Document Released: 04/12/2004 Document Revised: 04/06/2016 Document Reviewed: 04/06/2016 Elsevier Interactive Patient Education  [employer].

## 2017-09-08 ENCOUNTER — Encounter: Payer: Self-pay | Admitting: Cardiology

## 2017-09-08 NOTE — Progress Notes (Signed)
HPI She presents for followup of a nonischemic cardiomyopathy and atrial fibrillation.  Since I last saw her she has had a tough time.  She was in the hospital twice once in March with a TIA.  She had previous atrial fibrillation and we had avoided anticoagulation because she had no recurrence and she had risk for anticoagulants but she was noted to be apparently atrial fibrillation at that time and so the decision was  made for her to start anticoagulant.  She came back in the hospital in April of this year with acute cholecystitis and eventually had to have cholecystectomy.  She had problems with labile blood pressure.  I do notice that an echocardiogram done in March demonstrated a well-preserved ejection fraction.  She has since done okay.The patient denies any new symptoms such as chest discomfort, neck or arm discomfort. There has been no new shortness of breath, PND or orthopnea. There have been no reported palpitations, presyncope or syncope.  She is very thin and is not eating well.  She says she is having problems with diarrhea.  She has a new puppy and has lots of skin abrasions but seems to be tolerating the anticoagulants.  Allergies  Allergen Reactions  . Dabigatran Etexilate Mesylate Other (See Comments)    INTERNAL BLEEDING-pradaxa  . Talwin [Pentazocine] Other (See Comments)    hallucinations  . Lactate Rash    Current Outpatient Medications  Medication Sig Dispense Refill  . acetaminophen (TYLENOL) 325 MG tablet Take 2 tablets (650 mg total) by mouth every 4 (four) hours as needed.    [name] amLODipine (NORVASC) 2.5 MG tablet Take 1 tablet (2.5 mg total) by mouth daily. 90 tablet 3  . apixaban (ELIQUIS) 5 MG TABS tablet Take 1 tablet (5 mg total) by mouth 2 (two) times daily. 180 tablet 1  . atorvastatin (LIPITOR) 20 MG tablet Take 1 tablet (20 mg total) by mouth daily at 6 PM. 90 tablet 1  . carvedilol (COREG) 25 MG tablet TAKE 1 TABLET BY MOUTH TWICE DAILY 180 tablet 1  .  furosemide (LASIX) 40 MG tablet TAKE ONE TABLET (40mg ) BY MOUTH IN THE MORNING 90 tablet 3  . gabapentin (NEURONTIN) 600 MG tablet Take 1 tablet (600 mg total) by mouth 3 (three) times daily.    . nitroGLYCERIN (NITROSTAT) 0.4 MG SL tablet Place 1 tablet (0.4 mg total) under the tongue every 5 (five) minutes as needed. CHEST PAIN 25 tablet 5  . oxyCODONE-acetaminophen (PERCOCET) 10-325 MG tablet Take 1 tablet by mouth every 4 (four) hours as needed. PAIN 120 tablet 0  . pantoprazole (PROTONIX) 40 MG tablet Take 1 tablet (40 mg total) by mouth 2 (two) times daily. 60 tablet 0  . polyethylene glycol (MIRALAX / GLYCOLAX) packet Take 17 g by mouth daily. 14 each 0  . promethazine (PHENERGAN) 25 MG tablet Take 1 tablet (25 mg total) by mouth every 8 (eight) hours as needed. for nausea 40 tablet 5  . SPIRIVA RESPIMAT 1.25 MCG/ACT AERS INHALE 2 PUFFS INTO THE LUNGS DAILY 1 Inhaler 5  . tiZANidine (ZANAFLEX) 4 MG tablet Take 1 tablet (4 mg total) by mouth 3 (three) times daily as needed. for muscle spams 90 tablet 2  . zolpidem (AMBIEN) 5 MG tablet 1 po qhs prn 30 tablet 2   No current facility-administered medications for this visit.     Past Medical History:  Diagnosis Date  . Anemia   . Arthritis    right hip  .  Atrial fibrillation ([place])   . CAD (coronary artery disease)    mild disease per cath in 2011  . CHF (congestive heart failure) ([place])   . COPD (chronic obstructive pulmonary disease) (H[facility])    continuous O2- 2l  . Diverticulitis   . Elbow fracture, left aug 2012  . Fibromyalgia    COUPLE OF TIMES A YEAR-PT HAS EPISODES OF CONFUSION-USUALLY INVOLVES DAY/NIGHT REVERSAL AND EPISODES OF TWITCHING AND FALLS--SEES DR. Jacelyn Grip - NEUROLOGIST-LAST SEEN 07/10/11  . Gastroparesis   . GERD (gastroesophageal reflux disease)   . H/O: GI bleed    from Pradaxa  . Hepatitis    hx of in high school   . HTN (hypertension)    under control; has been on med. x "years"  . Hx MRSA infection   . Hx of  gastric ulcer   . Hx of stress fx aug 2011   right hip   . Iron malabsorption 08/14/2017  . LBBB (left bundle branch block)   . Oxygen dependent    2 liters via nasal cannula at all times  . Pacemaker    CRT therapy; followed by Dr. Caryl Comes  . Pleural effusion     s/p right thoracentesis 03/09  . Pneumonia - 03/09, 12/09, 02/10, 06/10   MOST RECENT FEB 2013  . Primary dilated cardiomyopathy (H[facility])    EF 45 to 50% per echo in Jan 2012  . RLS (restless legs syndrome)   . Silent aspiration   . Small bowel obstruction ([place])   . Urinary incontinence     -INTERSTIM IMPLANT NOT FUNCTIONING PER PT  . Venous embolism and thrombosis of subclavian vein ([place])    after pacemaker insertion Oct 2011  . Vitamin B12 deficiency     Past Surgical History:  Procedure Laterality Date  . APPENDECTOMY    . CARDIAC CATHETERIZATION  04/08/2006, 11/16/2009  . CHOLECYSTECTOMY N/A 06/27/2017   Procedure: OPEN CHOLECYSTECTOMY;  Surgeon: Ralene Ok, MD;  Location: [facility];  Service: General;  Laterality: N/A;  . COLECTOMY     Intestinal resection (5times)  . COLECTOMY  02/04/2000   ex. lap., intra-abd. subtotal colectomy with ileosigmoid colon anastomosies and lysis of adhesions  . CYSTOSCOPY  11/11/2008  . CYSTOSCOPY W/ RETROGRADES  11/11/2008   right  . CYSTOSCOPY WITH INJECTION  04/30/2006   transurethral collagen injection; incision vaginal stenosis  . ERCP N/A 06/23/2017   Procedure: ENDOSCOPIC RETROGRADE CHOLANGIOPANCREATOGRAPHY (ERCP);  Surgeon: Carol Ada, MD;  Location: [facility];  Service: Endoscopy;  Laterality: N/A;  . ERCP N/A 06/24/2017   Procedure: ENDOSCOPIC RETROGRADE CHOLANGIOPANCREATOGRAPHY (ERCP);  Surgeon: [name], MD;  Location: [facility];  Service: Endoscopy;  Laterality: N/A;  . ESOPHAGOGASTRODUODENOSCOPY N/A 02/02/2013   Procedure: ESOPHAGOGASTRODUODENOSCOPY (EGD);  Surgeon: Lafayette Dragon, MD;  Location: [facility];  Service: Endoscopy;  Laterality: N/A;  . EXPLORATORY  LAPAROTOMY  04/27/2009   lysis of adhesions, gastrostomy tube  . GASTROCUTANEOUS FISTULA CLOSURE    . INTERSTIM IMPLANT PLACEMENT  05/28/2006 - stage I   06/05/2006 - stage II  . INTERSTIM IMPLANT PLACEMENT  02/05/2012   Procedure: [name] IMPLANT FIRST STAGE;  Surgeon: Reece Packer, MD;  Location: [facility];  Service: Urology;  Laterality: N/A;  Replacement of Interstim Lead     . INTERSTIM IMPLANT REVISION  03/06/2011   Procedure: REVISION OF [name];  Surgeon: Reece Packer, MD;  Location: [facility];  Service: Urology;  Laterality: N/A;  Replacement of Neurostimulator  . INTERSTIM IMPLANT REVISION  10/23/2007  .  PACEMAKER PLACEMENT  12/21/2009  . Peg removed     with complications  . PORT-A-CATH REMOVAL  07/27/2011   Procedure: REMOVAL PORT-A-CATH;  Surgeon: Rolm Bookbinder, MD;  Location: [facility];  Service: General;  Laterality: N/A;  . PORTACATH PLACEMENT  12/16/2009  . SAVORY DILATION N/A 02/02/2013   Procedure: SAVORY DILATION;  Surgeon: Lafayette Dragon, MD;  Location: [facility];  Service: Endoscopy;  Laterality: N/A;  . SMALL INTESTINE SURGERY  05/20/2001   ex. lap., resection of small bowel stricture; gastrostomy; insertion central line  . TONSILLECTOMY  age 63  . TOTAL ABDOMINAL HYSTERECTOMY     complete  . TOTAL HIP ARTHROPLASTY  08/03/2011   Procedure: TOTAL HIP ARTHROPLASTY ANTERIOR APPROACH;  Surgeon: Mcarthur Rossetti, MD;  Location: [facility];  Service: Orthopedics;  Laterality: Right;    ROS:  As stated in the HPI and negative for all other systems.  PHYSICAL EXAM BP (!) 106/49   Pulse 66   Wt 92 lb 12.8 oz (42.1 kg)   BMI 18.12 kg/m   GENERAL:  Well appearing NECK:  No jugular venous distention, waveform within normal limits, carotid upstroke brisk and symmetric, no bruits, no thyromegaly LUNGS:  Clear to auscultation bilaterally CHEST:  Well healed pacemaker pocket.  HEART:  PMI not displaced or sustained,S1 and S2 within normal limits,  no S3, no S4, no clicks, no rubs, no murmurs ABD:  Flat, positive bowel sounds normal in frequency in pitch, no bruits, no rebound, no guarding, no midline pulsatile mass, no hepatomegaly, no splenomegaly EXT:  2 plus pulses throughout, no edema, no cyanosis no clubbing   EKG:  NA  ASSESSMENT AND PLAN   ATRIAL FIBRILLATION -  Ms. Peyton VEATRICE ECKSTEIN has a CHA2DS2 - VASc score of 4 with a risk of stroke of 4%.   I do not see recurrent atrial fibrillation but with her TIA and that past history the decision was made to put her back on blood thinners.  She is being followed closely.  She does have an iron deficiency anemia and is getting iron infusions and her hemoglobin is stable.  She is tolerating the medication.  I agree with this and no change in therapy is indicated.   CARDIOMYOPATHY, PRIMARY, DILATED -  Her EF is now reported to be 65%.   It was 50% in 2012.  She will continue the meds as listed.   HYPERTENSION -  Of note she has been out of her Norvasc for about a week.  I will renew this but at a lower dose.  Her BP is somewhat labile.    SYNCOPE - She has had no syncope.   MALNUTRITION - I have suggested a nutrition consult.  Her daughter has a Engineer, maintenance (IT) and she agrees to seek consultation.  She does not want a referral.    I extensively reviewed recent hospital records for this visit with results recorded above.

## 2017-09-09 ENCOUNTER — Ambulatory Visit: Payer: PPO | Admitting: Cardiology

## 2017-09-09 ENCOUNTER — Encounter: Payer: Self-pay | Admitting: Cardiology

## 2017-09-09 VITALS — BP 106/49 | HR 66 | Wt 92.8 lb

## 2017-09-09 DIAGNOSIS — I480 Paroxysmal atrial fibrillation: Secondary | ICD-10-CM

## 2017-09-09 DIAGNOSIS — E46 Unspecified protein-calorie malnutrition: Secondary | ICD-10-CM | POA: Diagnosis not present

## 2017-09-09 DIAGNOSIS — I10 Essential (primary) hypertension: Secondary | ICD-10-CM

## 2017-09-09 MED ORDER — AMLODIPINE BESYLATE 2.5 MG PO TABS: 2.5000 mg | ORAL_TABLET | Freq: Every day | ORAL | 3 refills | Status: DC

## 2017-09-09 NOTE — Patient Instructions (Signed)
Medication Instructions:  DECREASE- Amlodipine 2.5 mg daily  If you need a refill on your cardiac medications before your next appointment, please call your pharmacy.  Labwork: None Ordered   Testing/Procedures: None Ordered  Follow-Up: Your physician wants you to follow-up in: 6 Months. You should receive a reminder letter in the mail two months in advance. If you do not receive a letter, please call our office [phone].      Thank you for choosing [facility] at [facility]!!

## 2017-09-10 DIAGNOSIS — J449 Chronic obstructive pulmonary disease, unspecified: Secondary | ICD-10-CM | POA: Diagnosis not present

## 2017-09-10 DIAGNOSIS — I4891 Unspecified atrial fibrillation: Secondary | ICD-10-CM | POA: Diagnosis not present

## 2017-09-16 ENCOUNTER — Other Ambulatory Visit: Payer: Self-pay | Admitting: Family Medicine

## 2017-09-16 ENCOUNTER — Ambulatory Visit: Payer: PPO | Admitting: Pulmonary Disease

## 2017-09-16 DIAGNOSIS — R52 Pain, unspecified: Secondary | ICD-10-CM

## 2017-09-16 NOTE — Telephone Encounter (Signed)
Copied from [place] [id number]. Topic: Inquiry >> Sep 16, 2017  1:04 PM Pricilla Handler wrote: Reason for CRM: Patient called requesting a refill of OxyCODONE-acetaminophen (PERCOCET) 10-325 MG tablet. Patient's preferred pharmacy is [facility], [place] [phone] (Phone)  [phone] (Fax).    Thank You!!!

## 2017-09-17 ENCOUNTER — Other Ambulatory Visit: Payer: Self-pay | Admitting: Internal Medicine

## 2017-09-17 MED ORDER — OXYCODONE-ACETAMINOPHEN 10-325 MG PO TABS: 1.0000 | ORAL_TABLET | ORAL | 0 refills | Status: DC | PRN

## 2017-09-17 NOTE — Telephone Encounter (Signed)
Requesting:Percocet Contract:03/07/17 UDS:03/07/17 needs updated uds Last Visit:09/05/17 Next Visit:12/06/17 Last Refill:08/21/17  No discrepancies.   Please Advise

## 2017-09-17 NOTE — Telephone Encounter (Signed)
I have approved her med but let her know she will need a repeat UDS prior to next refill

## 2017-09-17 NOTE — Telephone Encounter (Signed)
Refill of percocet  LRF 08/21/17  #120  0 refills  LOV 09/05/17  Dr. Etter Sjogren- [facility] [address] [place] - [place]         [phone] (Phone)  [phone] (Fax).

## 2017-09-17 NOTE — Telephone Encounter (Signed)
How many refills Sir? She was seen in March.

## 2017-09-18 NOTE — Telephone Encounter (Signed)
Refill x 1 w/ 2 RF

## 2017-09-24 ENCOUNTER — Other Ambulatory Visit: Payer: Self-pay | Admitting: Family

## 2017-09-24 DIAGNOSIS — D500 Iron deficiency anemia secondary to blood loss (chronic): Secondary | ICD-10-CM

## 2017-09-25 ENCOUNTER — Inpatient Hospital Stay: Payer: PPO

## 2017-09-25 ENCOUNTER — Inpatient Hospital Stay: Payer: PPO | Attending: Family | Admitting: Family

## 2017-09-25 ENCOUNTER — Encounter: Payer: Self-pay | Admitting: Family

## 2017-09-25 ENCOUNTER — Other Ambulatory Visit: Payer: Self-pay

## 2017-09-25 VITALS — BP 125/56 | HR 66 | Temp 98.1°F | Resp 20 | Wt 91.1 lb

## 2017-09-25 DIAGNOSIS — I878 Other specified disorders of veins: Secondary | ICD-10-CM | POA: Diagnosis not present

## 2017-09-25 DIAGNOSIS — K909 Intestinal malabsorption, unspecified: Secondary | ICD-10-CM | POA: Diagnosis not present

## 2017-09-25 DIAGNOSIS — D508 Other iron deficiency anemias: Secondary | ICD-10-CM | POA: Diagnosis present

## 2017-09-25 DIAGNOSIS — Z7901 Long term (current) use of anticoagulants: Secondary | ICD-10-CM | POA: Insufficient documentation

## 2017-09-25 DIAGNOSIS — D500 Iron deficiency anemia secondary to blood loss (chronic): Secondary | ICD-10-CM | POA: Diagnosis not present

## 2017-09-25 DIAGNOSIS — Z9981 Dependence on supplemental oxygen: Secondary | ICD-10-CM | POA: Insufficient documentation

## 2017-09-25 LAB — CMP (CANCER CENTER ONLY)
ALT: 34 U/L (ref 0–44)
AST: 62 U/L — ABNORMAL HIGH (ref 15–41)
Albumin: 3.6 g/dL (ref 3.5–5.0)
Alkaline Phosphatase: 145 U/L — ABNORMAL HIGH (ref 38–126)
Anion gap: 6 (ref 5–15)
BUN: 11 mg/dL (ref 8–23)
CO2: 30 mmol/L (ref 22–32)
Calcium: 8.9 mg/dL (ref 8.9–10.3)
Chloride: 102 mmol/L (ref 98–111)
Creatinine: 0.78 mg/dL (ref 0.44–1.00)
GFR, Est AFR Am: >60 mL/min (ref >60)
GFR, Estimated: >60 mL/min (ref >60)
Glucose, Bld: 101 mg/dL — ABNORMAL HIGH (ref 70–99)
Potassium: 4.7 mmol/L (ref 3.5–5.1)
Sodium: 138 mmol/L (ref 135–145)
Total Bilirubin: 0.2 mg/dL — ABNORMAL LOW (ref 0.3–1.2)
Total Protein: 6.8 g/dL (ref 6.5–8.1)

## 2017-09-25 LAB — CBC WITH DIFFERENTIAL (CANCER CENTER ONLY)
Basophils Absolute: 0 10*3/uL (ref 0.0–0.1)
Basophils Relative: 0 %
Eosinophils Absolute: 0.1 10*3/uL (ref 0.0–0.5)
Eosinophils Relative: 2 %
HCT: 33.4 % — ABNORMAL LOW (ref 34.8–46.6)
Hemoglobin: 10.7 g/dL — ABNORMAL LOW (ref 11.6–15.9)
Lymphocytes Relative: 9 %
Lymphs Abs: 0.6 10*3/uL — ABNORMAL LOW (ref 0.9–3.3)
MCH: 28.8 pg (ref 26.0–34.0)
MCHC: 32 g/dL (ref 32.0–36.0)
MCV: 90 fL (ref 81.0–101.0)
Monocytes Absolute: 0.5 10*3/uL (ref 0.1–0.9)
Monocytes Relative: 8 %
Neutro Abs: 5.2 10*3/uL (ref 1.5–6.5)
Neutrophils Relative %: 81 %
Platelet Count: 227 10*3/uL (ref 145–400)
RBC: 3.71 MIL/uL (ref 3.70–5.32)
RDW: 16 % — ABNORMAL HIGH (ref 11.1–15.7)
WBC Count: 6.5 10*3/uL (ref 3.9–10.0)

## 2017-09-25 LAB — RETICULOCYTES
RBC.: 3.75 MIL/uL (ref 3.70–5.45)
Retic Count, Absolute: 41.3 10*3/uL (ref 33.7–90.7)
Retic Ct Pct: 1.1 % (ref 0.7–2.1)

## 2017-09-25 NOTE — Progress Notes (Signed)
Hematology and Oncology Follow Up Visit  [name] [id number] 1945-09-07 72 y.o. 09/25/2017   Principle Diagnosis:  Iron deficiency anemia  Current Therapy:   IV iron as indicated - last received in June 2019 x 2   Interim History: [name] is here today for follow-up. She is doing well and states she can tell that her energy is somewhat improved since her iron infusions in June. Hgb is now 10.7, MCV 90, platelets 227.  She has not noted any blood loss. Her skin is thin and she bruises easily on Eliquis.  She does stay cold most of the time.   Her SOB is stable and she is still on 3L [place] supplemental O2 24 hours a day.  She has occasional nausea (no vomiting) in the morning and is taking Protonix BID which helps.  No fever, cough, rash, dizziness, chest pain, palpitations, abdominal pain or changes in bowel or bladder habits.  No swelling, tenderness, numbness or tingling in her extremities. No lymphadenopathy noted on exam.  She has a good appetite and is staying well hydrated. Her weight is stable.   ECOG Performance Status: 1 - Symptomatic but completely ambulatory  Medications:  Allergies as of 09/25/2017      Reactions   Dabigatran Etexilate Mesylate Other (See Comments)   INTERNAL BLEEDING-pradaxa   Talwin [pentazocine] Other (See Comments)   hallucinations   Lactate Rash      Medication List        Accurate as of 09/25/17 10:47 AM. Always use your most recent med list.          acetaminophen 325 MG tablet Commonly known as:  TYLENOL Take 2 tablets (650 mg total) by mouth every 4 (four) hours as needed.   amLODipine 2.5 MG tablet Commonly known as:  NORVASC Take 1 tablet (2.5 mg total) by mouth daily.   apixaban 5 MG Tabs tablet Commonly known as:  ELIQUIS Take 1 tablet (5 mg total) by mouth 2 (two) times daily.   atorvastatin 20 MG tablet Commonly known as:  LIPITOR Take 1 tablet (20 mg total) by mouth daily at 6 PM.   carvedilol 25 MG  tablet Commonly known as:  COREG TAKE 1 TABLET BY MOUTH TWICE DAILY   furosemide 40 MG tablet Commonly known as:  LASIX TAKE ONE TABLET (40mg ) BY MOUTH IN THE MORNING   gabapentin 600 MG tablet Commonly known as:  NEURONTIN Take 1 tablet (600 mg total) by mouth 3 (three) times daily.   nitroGLYCERIN 0.4 MG SL tablet Commonly known as:  NITROSTAT Place 1 tablet (0.4 mg total) under the tongue every 5 (five) minutes as needed. CHEST PAIN   oxyCODONE-acetaminophen 10-325 MG tablet Commonly known as:  PERCOCET Take 1 tablet by mouth every 4 (four) hours as needed. PAIN   pantoprazole 40 MG tablet Commonly known as:  PROTONIX Take 1 tablet (40 mg total) by mouth 2 (two) times daily.   polyethylene glycol packet Commonly known as:  MIRALAX / GLYCOLAX Take 17 g by mouth daily.   promethazine 25 MG tablet Commonly known as:  PHENERGAN TAKE ONE TABLET BY MOUTH EVERY 8 HOURS AS NEEDED FOR NAUSEA   SPIRIVA RESPIMAT 1.25 MCG/ACT Aers Generic drug:  Tiotropium Bromide Monohydrate INHALE 2 PUFFS INTO THE LUNGS DAILY   tiZANidine 4 MG tablet Commonly known as:  ZANAFLEX Take 1 tablet (4 mg total) by mouth 3 (three) times daily as needed. for muscle spams   zolpidem 5 MG tablet Commonly  known as:  AMBIEN 1 po qhs prn       Allergies:  Allergies  Allergen Reactions  . Dabigatran Etexilate Mesylate Other (See Comments)    INTERNAL BLEEDING-pradaxa  . Talwin [Pentazocine] Other (See Comments)    hallucinations  . Lactate Rash    Past Medical History, Surgical history, Social history, and Family History were reviewed and updated.  Review of Systems: All other 10 point review of systems is negative.   Physical Exam:  vitals were not taken for this visit.   Wt Readings from Last 3 Encounters:  09/09/17 92 lb 12.8 oz (42.1 kg)  09/05/17 94 lb 3.2 oz (42.7 kg)  08/15/17 93 lb 12.8 oz (42.5 kg)    Ocular: Sclerae unicteric, pupils equal, round and reactive to  light Ear-nose-throat: Oropharynx clear, dentition fair Lymphatic: No cervical, supraclavicular or axillary adenopathy Lungs no rales or rhonchi, good excursion bilaterally Heart regular rate and rhythm, no murmur appreciated Abd soft, nontender, positive bowel sounds, no liver or spleen tip palpated on exam, no fluid wave  MSK no focal spinal tenderness, no joint edema Neuro: non-focal, well-oriented, appropriate affect Breasts: Deferred   Lab Results  Component Value Date   WBC 6.5 09/25/2017   HGB 10.7 (L) 09/25/2017   HCT 33.4 (L) 09/25/2017   MCV 90.0 09/25/2017   PLT 227 09/25/2017   Lab Results  Component Value Date   FERRITIN 340 (H) 08/14/2017   IRON 24 (L) 08/14/2017   TIBC 196 (L) 08/14/2017   UIBC 172 08/14/2017   IRONPCTSAT 12 (L) 08/14/2017   Lab Results  Component Value Date   RETICCTPCT 2.0 08/14/2017   RBC 3.71 09/25/2017   RETICCTABS 64.6 04/01/2009   No results found for: KPAFRELGTCHN, LAMBDASER, KAPLAMBRATIO No results found for: IGGSERUM, IGA, IGMSERUM No results found for: Ronnald Ramp, A1GS, A2GS, Tillman Sers, SPEI   Chemistry      Component Value Date/Time   NA 139 08/14/2017 0919   K 4.4 08/14/2017 0919   CL 101 08/14/2017 0919   CO2 27 08/14/2017 0919   BUN 10 08/14/2017 0919   CREATININE 0.98 08/14/2017 0919   CREATININE 0.91 03/16/2016 1539      Component Value Date/Time   CALCIUM 8.6 08/14/2017 0919   ALKPHOS 94 08/14/2017 0919   AST 17 08/14/2017 0919   ALT 10 08/14/2017 0919   BILITOT 0.3 08/14/2017 0919      Impression and Plan: [name] is a very pleasant 72 yo caucasian female with iron deficiency anemia secondary to malabsorption and possibly intermittent GI blood loss on Eliquis.  She has poor venous access and would like a port a cath. She has had multiple sticks for both labs and also IV access for iron infusions. This has been rough on her. We will get her scheduled for in the next.  She  verbalized understanding that she is to stop her Eliquis 2 days before the procedure and restart the day after.  We will see what her iron studies show and bring her back in for infusion if needed once her port is placed.  We will plan to see her back in another 6 weeks for follow-up.  They will contact our office with any questions or concerns. We can certainly see her sooner if needed.   Laverna Peace, NP 7/10/201910:47 AM

## 2017-09-26 LAB — IRON AND TIBC
Iron: 41 ug/dL (ref 41–142)
Saturation Ratios: 19 % — ABNORMAL LOW (ref 21–57)
TIBC: 218 ug/dL — ABNORMAL LOW (ref 236–444)
UIBC: 177 ug/dL

## 2017-09-26 LAB — FERRITIN: Ferritin: 1195 ng/mL — ABNORMAL HIGH (ref 11–307)

## 2017-10-02 ENCOUNTER — Ambulatory Visit (INDEPENDENT_AMBULATORY_CARE_PROVIDER_SITE_OTHER)
Admission: RE | Admit: 2017-10-02 | Discharge: 2017-10-02 | Disposition: A | Discharge status: Home / Self Care | Payer: PPO | Source: Ambulatory Visit | Attending: Primary Care | Admitting: Primary Care

## 2017-10-02 ENCOUNTER — Encounter: Payer: Self-pay | Admitting: Primary Care

## 2017-10-02 ENCOUNTER — Ambulatory Visit: Payer: PPO | Admitting: Primary Care

## 2017-10-02 VITALS — BP 120/60 | HR 76 | Ht 60.0 in | Wt 92.0 lb

## 2017-10-02 DIAGNOSIS — Z8701 Personal history of pneumonia (recurrent): Secondary | ICD-10-CM

## 2017-10-02 DIAGNOSIS — J189 Pneumonia, unspecified organism: Secondary | ICD-10-CM | POA: Diagnosis not present

## 2017-10-02 DIAGNOSIS — R05 Cough: Secondary | ICD-10-CM | POA: Diagnosis not present

## 2017-10-02 NOTE — Progress Notes (Signed)
Reviewed and agree with assessment/plan.   Naveyah Iacovelli, MD [facility] 03/14/2016, 12:24 PM Pager:  [phone]  

## 2017-10-02 NOTE — Progress Notes (Signed)
'@Patient'  ID: [name], female    DOB: 16-Nov-1945, 72 y.o.   MRN: [id number]  Chief Complaint  Patient presents with  . Acute Visit    chest congestion, cough, prone to pneumonia    Referring provider: Ann Held, *  HPI: 72 year old female. PMH HTN, afib, HF, COPD, reccurent PNA, bronchiectasis. Patient of Dr.Sood. Last seen by Wyn Quaker, NP on 5/30 for cough and congeston. CXR showed RLL PNA, started on Levaquin and given prednisone taper.   10/02/2017 Patient presents today with complaints of chest congestion. Seen in May for PNA, treated with Levaquin. Felt well until recently, developed new cough 5 days ago. Reports very little production, patient is unsure of color. Breathing is ok, not labored. No increase in oxygen demand. States that she got scared d/t recurrent PNAs. Awaiting port-a-cath placement on 7/23 for iron transfusion and access. Denies chills, fever, sob.     Allergies  Allergen Reactions  . Dabigatran Etexilate Mesylate Other (See Comments)    INTERNAL BLEEDING-pradaxa  . Talwin [Pentazocine] Other (See Comments)    hallucinations  . Lactate Rash    Immunization History  Administered Date(s) Administered  . DTaP 11/03/1996, 01/04/1997, 05/03/1997, 10/19/1997, 08/05/2001  . HPV Quadrivalent 09/05/2009, 09/13/2010, 09/26/2011  . Hepatitis A, Ped/Adol-2 Dose 09/21/2004, 03/29/2005  . Hepatitis B, ped/adol 04/30/1996, 11/03/1996, 10/19/1997  . HiB (PRP-T) 11/03/1996, 01/04/1997, 05/03/1997, 10/19/1997  . IPV 11/03/1996, 01/04/1997, 05/03/1997, 08/05/2001  . Influenza Split 01/16/2011, 12/12/2011  . Influenza Whole 01/15/2007, 12/08/2007, 12/28/2008, 12/17/2009  . Influenza, High Dose Seasonal PF 11/28/2015, 12/07/2016  . Influenza, Seasonal, Injecte, Preservative Fre 12/09/2012  . Influenza,Quad,Nasal, Live 03/17/2007, 03/06/2009, 01/08/2010, 01/30/2011, 02/09/2012, 01/18/2013  . Influenza,inj,Quad PF,6+ Mos 03/29/2005, 04/30/2005,  03/01/2006, 01/28/2008, 12/01/2013, 01/16/2014, 12/22/2014, 12/28/2014  . MMR 05/03/1997, 08/05/2001  . Meningococcal B, OMV 12/22/2014  . Pneumococcal Conjugate-13 06/10/2014, 11/15/2014  . Pneumococcal Polysaccharide-23 12/17/2009  . Tdap 09/17/2007, 05/15/2014  . Varicella 11/16/1997, 05/18/2008  . Zoster 12/31/2013    Past Medical History:  Diagnosis Date  . Anemia   . Arthritis    right hip  . Atrial fibrillation ([place])   . CAD (coronary artery disease)    mild disease per cath in 2011  . CHF (congestive heart failure) ([place])   . COPD (chronic obstructive pulmonary disease) ([facility])    continuous O2- 2l  . Diverticulitis   . Elbow fracture, left aug 2012  . Fibromyalgia    COUPLE OF TIMES A YEAR-PT HAS EPISODES OF CONFUSION-USUALLY INVOLVES DAY/NIGHT REVERSAL AND EPISODES OF TWITCHING AND FALLS--SEES DR. Jacelyn Grip - NEUROLOGIST-LAST SEEN 07/10/11  . Gastroparesis   . GERD (gastroesophageal reflux disease)   . H/O: GI bleed    from Pradaxa  . Hepatitis    hx of in high school   . HTN (hypertension)    under control; has been on med. x "years"  . Hx MRSA infection   . Hx of gastric ulcer   . Hx of stress fx aug 2011   right hip   . Iron malabsorption 08/14/2017  . LBBB (left bundle branch block)   . Oxygen dependent    2 liters via nasal cannula at all times  . Pacemaker    CRT therapy; followed by Dr. Caryl Comes  . Pleural effusion     s/p right thoracentesis 03/09  . Pneumonia - 03/09, 12/09, 02/10, 06/10   MOST RECENT FEB 2013  . Primary dilated cardiomyopathy (H[facility])    EF 45 to 50% per echo  in Jan 2012  . RLS (restless legs syndrome)   . Silent aspiration   . Small bowel obstruction ([place])   . Urinary incontinence     -INTERSTIM IMPLANT NOT FUNCTIONING PER PT  . Venous embolism and thrombosis of subclavian vein ([place])    after pacemaker insertion Oct 2011  . Vitamin B12 deficiency     Tobacco History: Social History   Tobacco Use  Smoking Status Former Smoker  .  Packs/day: 1.00  . Years: 20.00  . Pack years: 20.00  . Last attempt to quit: 03/19/1986  . Years since quitting: 31.5  Smokeless Tobacco Never Used   Counseling given: Not Answered   Outpatient Medications Prior to Visit  Medication Sig Dispense Refill  . acetaminophen (TYLENOL) 325 MG tablet Take 2 tablets (650 mg total) by mouth every 4 (four) hours as needed.    [name] amLODipine (NORVASC) 2.5 MG tablet Take 1 tablet (2.5 mg total) by mouth daily. 90 tablet 3  . apixaban (ELIQUIS) 5 MG TABS tablet Take 1 tablet (5 mg total) by mouth 2 (two) times daily. 180 tablet 1  . atorvastatin (LIPITOR) 20 MG tablet Take 1 tablet (20 mg total) by mouth daily at 6 PM. 90 tablet 1  . carvedilol (COREG) 25 MG tablet TAKE 1 TABLET BY MOUTH TWICE DAILY 180 tablet 1  . denosumab (PROLIA) 60 MG/ML SOSY injection Inject 60 mg into the skin every 6 (six) months.    . furosemide (LASIX) 40 MG tablet TAKE ONE TABLET (70m) BY MOUTH IN THE MORNING 90 tablet 3  . gabapentin (NEURONTIN) 600 MG tablet Take 1 tablet (600 mg total) by mouth 3 (three) times daily.    . nitroGLYCERIN (NITROSTAT) 0.4 MG SL tablet Place 1 tablet (0.4 mg total) under the tongue every 5 (five) minutes as needed. CHEST PAIN 25 tablet 5  . oxyCODONE-acetaminophen (PERCOCET) 10-325 MG tablet Take 1 tablet by mouth every 4 (four) hours as needed. PAIN 120 tablet 0  . pantoprazole (PROTONIX) 40 MG tablet Take 1 tablet (40 mg total) by mouth 2 (two) times daily. 60 tablet 0  . polyethylene glycol (MIRALAX / GLYCOLAX) packet Take 17 g by mouth daily. 14 each 0  . promethazine (PHENERGAN) 25 MG tablet TAKE ONE TABLET BY MOUTH EVERY 8 HOURS AS NEEDED FOR NAUSEA 40 tablet 2  . SPIRIVA RESPIMAT 1.25 MCG/ACT AERS INHALE 2 PUFFS INTO THE LUNGS DAILY 1 Inhaler 5  . tiZANidine (ZANAFLEX) 4 MG tablet Take 1 tablet (4 mg total) by mouth 3 (three) times daily as needed. for muscle spams 90 tablet 2  . zolpidem (AMBIEN) 5 MG tablet 1 po qhs prn 30 tablet 2    No facility-administered medications prior to visit.       Review of Systems  Review of Systems  Constitutional: Negative.   HENT: Negative.   Respiratory: Positive for cough. Negative for shortness of breath and wheezing.   Cardiovascular: Negative.   Gastrointestinal: Negative.   Skin: Negative.     Physical Exam  BP 120/60 (BP Location: Left Arm, Cuff Size: Normal)   Pulse 76   Ht 5' (1.524 m)   Wt 92 lb (41.7 kg)   SpO2 98%   BMI 17.97 kg/m  Physical Exam  Constitutional: She is oriented to person, place, and time.  Thin/frail elderly woman. Well appearance.   HENT:  Head: Normocephalic and atraumatic.  Neck: Normal range of motion. Neck supple.  Cardiovascular: Normal rate and regular rhythm.  Pulmonary/Chest: Effort  normal. No tachypnea. No respiratory distress. She has no wheezes. She has no rales.  LS mostly clear t/o, faint rhonchi right base.   Abdominal: Soft.  Neurological: She is alert and oriented to person, place, and time.  Skin: Skin is warm and dry.  Psychiatric: She has a normal mood and affect. Her speech is normal and behavior is normal.     Lab Results:  CBC    Component Value Date/Time   WBC 6.5 09/25/2017 1023   WBC 7.9 09/05/2017 1134   RBC 3.71 09/25/2017 1023   RBC 3.75 09/25/2017 1023   HGB 10.7 (L) 09/25/2017 1023   HGB 10.7 (L) 04/01/2009 1048   HGB 11.8 05/01/2007 0845   HCT 33.4 (L) 09/25/2017 1023   HCT 32.4 (L) 04/01/2009 1048   HCT 37.7 05/01/2007 0845   PLT 227 09/25/2017 1023   PLT 420 (H) 04/01/2009 1048   PLT 264 05/01/2007 0845   MCV 90.0 09/25/2017 1023   MCV 83 04/01/2009 1048   MCV 87.8 05/01/2007 0845   MCH 28.8 09/25/2017 1023   MCHC 32.0 09/25/2017 1023   RDW 16.0 (H) 09/25/2017 1023   RDW 13.6 04/01/2009 1048   RDW 13.7 05/01/2007 0845   LYMPHSABS 0.6 (L) 09/25/2017 1023   LYMPHSABS 1.0 04/01/2009 1048   LYMPHSABS 0.6 (L) 05/01/2007 0845   MONOABS 0.5 09/25/2017 1023   MONOABS 0.5 05/01/2007 0845    EOSABS 0.1 09/25/2017 1023   EOSABS 0.1 04/01/2009 1048   BASOSABS 0.0 09/25/2017 1023   BASOSABS 0.0 04/01/2009 1048   BASOSABS 0.0 05/01/2007 0845    BMET    Component Value Date/Time   NA 138 09/25/2017 1023   K 4.7 09/25/2017 1023   CL 102 09/25/2017 1023   CO2 30 09/25/2017 1023   GLUCOSE 101 (H) 09/25/2017 1023   GLUCOSE 104 (H) 03/11/2006 1059   BUN 11 09/25/2017 1023   CREATININE 0.78 09/25/2017 1023   CREATININE 0.91 03/16/2016 1539   CALCIUM 8.9 09/25/2017 1023   GFRNONAA >60 09/25/2017 1023   GFRAA >60 09/25/2017 1023    Imaging: No results found.   Assessment & Plan:   72 year old female, hx recurrent PNA. Most recently treated with Levaquin in May for RLL PNA. Patient presents today with cough x 5 days. Little production. No fever, chills or increase in sob. Patient needing reassurance, plan recheck CXR.  Recurrent pneumonia - Recent RLL PNA in May, needs repeat chest xray   Use flutter valve, mucinex and delsym prn cough/congestion Stay hydrated and eat a high protein diet  Call if patient experiences fever, increase SOB or sputum color  Return as needed      Martyn Ehrich, NP 10/02/2017

## 2017-10-02 NOTE — Assessment & Plan Note (Signed)
-   Recent RLL PNA in May, needs repeat chest xray   Use flutter valve, mucinex and delsym prn cough/congestion Stay hydrated and eat a high protein diet  Call if patient experiences fever, increase SOB or sputum color  Return as needed

## 2017-10-02 NOTE — Patient Instructions (Signed)
Use flutter valve, mucinex and delsym prn cough/congestion Check CXR today Stay hydrated and eat a high protein diet  Call if get fever, increase SOB or sputum color  Return as needed

## 2017-10-07 ENCOUNTER — Other Ambulatory Visit: Payer: Self-pay | Admitting: Radiology

## 2017-10-08 ENCOUNTER — Ambulatory Visit (HOSPITAL_COMMUNITY)
Admission: RE | Admit: 2017-10-08 | Discharge: 2017-10-08 | Disposition: A | Discharge status: Home / Self Care | Payer: PPO | Source: Ambulatory Visit | Attending: Hematology & Oncology | Admitting: Hematology & Oncology

## 2017-10-08 ENCOUNTER — Encounter (HOSPITAL_COMMUNITY): Payer: Self-pay

## 2017-10-08 ENCOUNTER — Other Ambulatory Visit: Payer: Self-pay | Admitting: Family

## 2017-10-08 ENCOUNTER — Ambulatory Visit (HOSPITAL_COMMUNITY)
Admission: RE | Admit: 2017-10-08 | Discharge: 2017-10-08 | Disposition: A | Discharge status: Home / Self Care | Payer: PPO | Source: Ambulatory Visit | Attending: Family | Admitting: Family

## 2017-10-08 DIAGNOSIS — I2510 Atherosclerotic heart disease of native coronary artery without angina pectoris: Secondary | ICD-10-CM | POA: Diagnosis not present

## 2017-10-08 DIAGNOSIS — K3184 Gastroparesis: Secondary | ICD-10-CM | POA: Insufficient documentation

## 2017-10-08 DIAGNOSIS — K219 Gastro-esophageal reflux disease without esophagitis: Secondary | ICD-10-CM | POA: Insufficient documentation

## 2017-10-08 DIAGNOSIS — Z8614 Personal history of Methicillin resistant Staphylococcus aureus infection: Secondary | ICD-10-CM | POA: Insufficient documentation

## 2017-10-08 DIAGNOSIS — I509 Heart failure, unspecified: Secondary | ICD-10-CM | POA: Insufficient documentation

## 2017-10-08 DIAGNOSIS — Z950 Presence of cardiac pacemaker: Secondary | ICD-10-CM | POA: Insufficient documentation

## 2017-10-08 DIAGNOSIS — N3000 Acute cystitis without hematuria: Secondary | ICD-10-CM

## 2017-10-08 DIAGNOSIS — D500 Iron deficiency anemia secondary to blood loss (chronic): Secondary | ICD-10-CM

## 2017-10-08 DIAGNOSIS — D509 Iron deficiency anemia, unspecified: Secondary | ICD-10-CM | POA: Insufficient documentation

## 2017-10-08 DIAGNOSIS — I110 Hypertensive heart disease with heart failure: Secondary | ICD-10-CM | POA: Diagnosis not present

## 2017-10-08 DIAGNOSIS — I420 Dilated cardiomyopathy: Secondary | ICD-10-CM | POA: Diagnosis not present

## 2017-10-08 DIAGNOSIS — I878 Other specified disorders of veins: Secondary | ICD-10-CM

## 2017-10-08 DIAGNOSIS — Z9981 Dependence on supplemental oxygen: Secondary | ICD-10-CM | POA: Insufficient documentation

## 2017-10-08 DIAGNOSIS — J449 Chronic obstructive pulmonary disease, unspecified: Secondary | ICD-10-CM | POA: Insufficient documentation

## 2017-10-08 DIAGNOSIS — Z5309 Procedure and treatment not carried out because of other contraindication: Secondary | ICD-10-CM | POA: Insufficient documentation

## 2017-10-08 DIAGNOSIS — M797 Fibromyalgia: Secondary | ICD-10-CM | POA: Insufficient documentation

## 2017-10-08 DIAGNOSIS — N390 Urinary tract infection, site not specified: Secondary | ICD-10-CM | POA: Insufficient documentation

## 2017-10-08 DIAGNOSIS — Z87891 Personal history of nicotine dependence: Secondary | ICD-10-CM | POA: Diagnosis not present

## 2017-10-08 DIAGNOSIS — Z7901 Long term (current) use of anticoagulants: Secondary | ICD-10-CM | POA: Diagnosis not present

## 2017-10-08 DIAGNOSIS — G2581 Restless legs syndrome: Secondary | ICD-10-CM | POA: Diagnosis not present

## 2017-10-08 DIAGNOSIS — I447 Left bundle-branch block, unspecified: Secondary | ICD-10-CM | POA: Diagnosis not present

## 2017-10-08 DIAGNOSIS — I4891 Unspecified atrial fibrillation: Secondary | ICD-10-CM | POA: Insufficient documentation

## 2017-10-08 LAB — CBC
HCT: 37.3 % (ref 36.0–46.0)
Hemoglobin: 11.8 g/dL — ABNORMAL LOW (ref 12.0–15.0)
MCH: 28.6 pg (ref 26.0–34.0)
MCHC: 31.6 g/dL (ref 30.0–36.0)
MCV: 90.3 fL (ref 78.0–100.0)
Platelets: 285 10*3/uL (ref 150–400)
RBC: 4.13 MIL/uL (ref 3.87–5.11)
RDW: 15.6 % — ABNORMAL HIGH (ref 11.5–15.5)
WBC: 18.5 10*3/uL — ABNORMAL HIGH (ref 4.0–10.5)

## 2017-10-08 LAB — URINALYSIS, COMPLETE (UACMP) WITH MICROSCOPIC
Bilirubin Urine: NEGATIVE
Glucose, UA: NEGATIVE mg/dL
Hgb urine dipstick: NEGATIVE
Ketones, ur: NEGATIVE mg/dL
Nitrite: NEGATIVE
Protein, ur: NEGATIVE mg/dL
Specific Gravity, Urine: 1.011 (ref 1.005–1.030)
pH: 6 (ref 5.0–8.0)

## 2017-10-08 LAB — PROTIME-INR
INR: 1.03
Prothrombin Time: 13.4 seconds (ref 11.4–15.2)

## 2017-10-08 LAB — APTT: aPTT: 32 seconds (ref 24–36)

## 2017-10-08 MED ORDER — CEFAZOLIN SODIUM-DEXTROSE 2-4 GM/100ML-% IV SOLN: 2.0000 g | Freq: Once | INTRAVENOUS | Status: DC

## 2017-10-08 MED ORDER — SODIUM CHLORIDE 0.9 % IV SOLN
INTRAVENOUS | Status: DC
  Administered 2017-10-08: 12:00:00 via INTRAVENOUS

## 2017-10-08 NOTE — Progress Notes (Signed)
Due to WBC of 18.5 and +UA with many bacteria, will not proceed with port placement. Needs tx for UTI. Contacted referring team. They will contact pt to begin abx. Plan to tentatively reschedule port placement for next week. Instructions given.  Ascencion Dike PA-C Interventional Radiology 10/08/2017 2:39 PM

## 2017-10-08 NOTE — H&P (Signed)
Chief Complaint: Patient was seen in consultation today for port placement at the request of [place] M  Referring Physician(s): [place] M  Supervising Physician: Daryll Brod  Patient Status: [facility] - Out-pt  History of Present Illness: [name] is a 72 y.o. female with chronic anemia from iron malabsorption. She requires frequent iron infusions and labs draws. She has recently had increasing difficulty with venous access. She was referred for port placement. She has been doing well, recently treated for PNA, but finished her abx and states she's been doing great. She saw pulmonary last week who told her she was all clear. She denies recent fevers, does say she's has some odor to her urine but no dysuria. PMHx, meds, labs, imaging, allergies reviewed. Held her Eliquis X 2 days as instructed. Has been NPO today as directed. Family at bedside.   Past Medical History:  Diagnosis Date  . Anemia   . Arthritis    right hip  . Atrial fibrillation ([place])   . CAD (coronary artery disease)    mild disease per cath in 2011  . CHF (congestive heart failure) ([place])   . COPD (chronic obstructive pulmonary disease) ([facility])    continuous O2- 2l  . Diverticulitis   . Elbow fracture, left aug 2012  . Fibromyalgia    COUPLE OF TIMES A YEAR-PT HAS EPISODES OF CONFUSION-USUALLY INVOLVES DAY/NIGHT REVERSAL AND EPISODES OF TWITCHING AND FALLS--SEES DR. Jacelyn Grip - NEUROLOGIST-LAST SEEN 07/10/11  . Gastroparesis   . GERD (gastroesophageal reflux disease)   . H/O: GI bleed    from Pradaxa  . Hepatitis    hx of in high school   . HTN (hypertension)    under control; has been on med. x "years"  . Hx MRSA infection   . Hx of gastric ulcer   . Hx of stress fx aug 2011   right hip   . Iron malabsorption 08/14/2017  . LBBB (left bundle branch block)   . Oxygen dependent    2 liters via nasal cannula at all times  . Pacemaker    CRT therapy; followed by Dr. Caryl Comes  . Pleural  effusion     s/p right thoracentesis 03/09  . Pneumonia - 03/09, 12/09, 02/10, 06/10   MOST RECENT FEB 2013  . Primary dilated cardiomyopathy (H[facility])    EF 45 to 50% per echo in Jan 2012  . RLS (restless legs syndrome)   . Silent aspiration   . Small bowel obstruction ([place])   . Urinary incontinence     -INTERSTIM IMPLANT NOT FUNCTIONING PER PT  . Venous embolism and thrombosis of subclavian vein ([place])    after pacemaker insertion Oct 2011  . Vitamin B12 deficiency     Past Surgical History:  Procedure Laterality Date  . APPENDECTOMY    . CARDIAC CATHETERIZATION  04/08/2006, 11/16/2009  . CHOLECYSTECTOMY N/A 06/27/2017   Procedure: OPEN CHOLECYSTECTOMY;  Surgeon: Ralene Ok, MD;  Location: [facility];  Service: General;  Laterality: N/A;  . COLECTOMY     Intestinal resection (5times)  . COLECTOMY  02/04/2000   ex. lap., intra-abd. subtotal colectomy with ileosigmoid colon anastomosies and lysis of adhesions  . CYSTOSCOPY  11/11/2008  . CYSTOSCOPY W/ RETROGRADES  11/11/2008   right  . CYSTOSCOPY WITH INJECTION  04/30/2006   transurethral collagen injection; incision vaginal stenosis  . ERCP N/A 06/23/2017   Procedure: ENDOSCOPIC RETROGRADE CHOLANGIOPANCREATOGRAPHY (ERCP);  Surgeon: Carol Ada, MD;  Location: [place];  Service: Endoscopy;  Laterality: N/A;  .  ERCP N/A 06/24/2017   Procedure: ENDOSCOPIC RETROGRADE CHOLANGIOPANCREATOGRAPHY (ERCP);  Surgeon: [name], MD;  [facility]COPY;  Service: Endoscopy;  Laterality: N/A;  . ESOPHAGOGASTRODUODENOSCOPY N/A 02/02/2013   Procedure: ESOPHAGOGASTRODUODENOSCOPY (EGD);  Surgeon: Lafayette Dragon, MD;  Location: [facility];  Service: Endoscopy;  Laterality: N/A;  . EXPLORATORY LAPAROTOMY  04/27/2009   lysis of adhesions, gastrostomy tube  . GASTROCUTANEOUS FISTULA CLOSURE    . INTERSTIM IMPLANT PLACEMENT  05/28/2006 - stage I   06/05/2006 - stage II  . INTERSTIM IMPLANT PLACEMENT  02/05/2012   Procedure: [name] IMPLANT FIRST  STAGE;  Surgeon: Reece Packer, MD;  Location: [facility];  Service: Urology;  Laterality: N/A;  Replacement of Interstim Lead     . INTERSTIM IMPLANT REVISION  03/06/2011   Procedure: REVISION OF [name];  Surgeon: Reece Packer, MD;  Location: [facility];  Service: Urology;  Laterality: N/A;  Replacement of Neurostimulator  . INTERSTIM IMPLANT REVISION  10/23/2007  . PACEMAKER PLACEMENT  12/21/2009  . Peg removed     with complications  . PORT-A-CATH REMOVAL  07/27/2011   Procedure: REMOVAL PORT-A-CATH;  Surgeon: Rolm Bookbinder, MD;  Location: [place];  Service: General;  Laterality: N/A;  . PORTACATH PLACEMENT  12/16/2009  . SAVORY DILATION N/A 02/02/2013   Procedure: SAVORY DILATION;  Surgeon: Lafayette Dragon, MD;  Location: [facility];  Service: Endoscopy;  Laterality: N/A;  . SMALL INTESTINE SURGERY  05/20/2001   ex. lap., resection of small bowel stricture; gastrostomy; insertion central line  . TONSILLECTOMY  age 65  . TOTAL ABDOMINAL HYSTERECTOMY     complete  . TOTAL HIP ARTHROPLASTY  08/03/2011   Procedure: TOTAL HIP ARTHROPLASTY ANTERIOR APPROACH;  Surgeon: Mcarthur Rossetti, MD;  Location: [facility];  Service: Orthopedics;  Laterality: Right;    Allergies: Dabigatran etexilate mesylate; Talwin [pentazocine]; and Lactate  Medications: Prior to Admission medications   Medication Sig Start Date End Date Taking? Authorizing Provider  acetaminophen (TYLENOL) 325 MG tablet Take 2 tablets (650 mg total) by mouth every 4 (four) hours as needed. 07/01/17  Yes Saverio Danker, PA-C  amLODipine (NORVASC) 2.5 MG tablet Take 1 tablet (2.5 mg total) by mouth daily. 09/09/17  Yes Minus Breeding, MD  apixaban (ELIQUIS) 5 MG TABS tablet Take 1 tablet (5 mg total) by mouth 2 (two) times daily. 07/08/17  Yes Roma Schanz R, DO  atorvastatin (LIPITOR) 20 MG tablet Take 1 tablet (20 mg total) by mouth daily at 6 PM. 07/08/17  Yes Lowne Chase, Yvonne R, DO  carvedilol  (COREG) 25 MG tablet TAKE 1 TABLET BY MOUTH TWICE DAILY 08/23/17  Yes Minus Breeding, MD  gabapentin (NEURONTIN) 600 MG tablet Take 1 tablet (600 mg total) by mouth 3 (three) times daily. 06/01/17  Yes Aline August, MD  oxyCODONE-acetaminophen (PERCOCET) 10-325 MG tablet Take 1 tablet by mouth every 4 (four) hours as needed. PAIN 09/17/17  Yes Mosie Lukes, MD  pantoprazole (PROTONIX) 40 MG tablet Take 1 tablet (40 mg total) by mouth 2 (two) times daily. 06/01/17  Yes Aline August, MD  SPIRIVA RESPIMAT 1.25 MCG/ACT AERS INHALE 2 PUFFS INTO THE LUNGS DAILY 07/08/17  Yes Chesley Mires, MD  tiZANidine (ZANAFLEX) 4 MG tablet Take 1 tablet (4 mg total) by mouth 3 (three) times daily as needed. for muscle spams 07/08/17  Yes Ann Held, DO  zolpidem (AMBIEN) 5 MG tablet 1 po qhs prn 07/08/17  Yes Ann Held, DO  denosumab (PROLIA) 60 MG/ML SOSY injection Inject 60 mg into the skin every 6 (six) months. 03/25/15   [provider]  furosemide (LASIX) 40 MG tablet TAKE ONE TABLET (40mg ) BY MOUTH IN THE MORNING 07/30/17   Minus Breeding, MD  nitroGLYCERIN (NITROSTAT) 0.4 MG SL tablet Place 1 tablet (0.4 mg total) under the tongue every 5 (five) minutes as needed. CHEST PAIN 05/15/17   Deboraha Sprang, MD  polyethylene glycol [facility] / Floria Raveling) packet Take 17 g by mouth daily. 07/02/17   Lavina Hamman, MD  promethazine (PHENERGAN) 25 MG tablet TAKE ONE TABLET BY MOUTH EVERY 8 HOURS AS NEEDED FOR NAUSEA 09/18/17   Gatha Mayer, MD  iron dextran complex (INFED) 50 MG/ML injection Please give infed infusion (no test dose needed) over 4 hours per pharmacy calculated dose. Ht: 5'2 Wt: 99 pounds Hgb: 12 12/20/10 06/05/11  Lafayette Dragon, MD     Family History  Problem Relation Age of Onset  . Heart disease Father   . Stroke Mother   . Heart disease Mother   . Heart disease Brother   . Breast cancer Maternal Aunt   . Heart disease Paternal Aunt   . Diabetes Paternal Grandfather     . Colon cancer Neg Hx     Social History   Socioeconomic History  . Marital status: Married    Spouse name: Not on file  . Number of children: 5  . Years of education: Not on file  . Highest education level: Not on file  Occupational History  . Occupation: disabled    Employer: RETIRED  Social Needs  . Financial resource strain: Not on file  . Food insecurity:    Worry: Not on file    Inability: Not on file  . Transportation needs:    Medical: Not on file    Non-medical: Not on file  Tobacco Use  . Smoking status: Former Smoker    Packs/day: 1.00    Years: 20.00    Pack years: 20.00    Last attempt to quit: 03/19/1986    Years since quitting: 31.5  . Smokeless tobacco: Never Used  Substance and Sexual Activity  . Alcohol use: No    Alcohol/week: 0.0 oz  . Drug use: No  . Sexual activity: Yes  Lifestyle  . Physical activity:    Days per week: Not on file    Minutes per session: Not on file  . Stress: Not on file  Relationships  . Social connections:    Talks on phone: Not on file    Gets together: Not on file    Attends religious service: Not on file    Active member of club or organization: Not on file    Attends meetings of clubs or organizations: Not on file    Relationship status: Not on file  Other Topics Concern  . Not on file  Social History Narrative   Occupation: Disabled   Married - second time - biologic daughter (in [place]) from first marriage - adopted 4 3 alive with second marriage   Alcohol Use - no   Illicit Drug Use - no   Patient is a former smoker, quit 1988 x67yrs, <1ppd.    Daily Caffeine Use   01/20/2015 - updated     Review of Systems: A 12 point ROS discussed and pertinent positives are indicated in the HPI above.  All other systems are negative.  Review of Systems  Vital Signs: Ht 5' (1.524  m)   Wt 90 lb (40.8 kg)   BMI 17.58 kg/m   Physical Exam  Constitutional: She is oriented to person, place, and time. She appears  well-developed. No distress.  HENT:  Head: Normocephalic.  Mouth/Throat: Oropharynx is clear and moist.  Neck: Normal range of motion. No JVD present.  Cardiovascular: Normal rate, regular rhythm and normal heart sounds.  Pulmonary/Chest: Effort normal and breath sounds normal. No respiratory distress.  Abdominal: Soft. She exhibits no distension. There is no guarding.  Neurological: She is alert and oriented to person, place, and time.  Skin: Skin is warm and dry.  Psychiatric: She has a normal mood and affect. Judgment normal.    Imaging: Dg Chest 2 View  Result Date: 10/02/2017 CLINICAL DATA:  Cough with mucus AND sob x 1 wk, pt on oxygen, pt had PNA x 2 mos ago, HTN, x-smoker, hx of COPD, CAD, CHF, afib. EXAM: CHEST - 2 VIEW COMPARISON:  08/15/2017 FINDINGS: Stable pacer. Stable somewhat coarse interstitial accentuation in the lungs. Vague nodularity in the left mid lung, about 1.1 cm in diameter. Atherosclerotic calcification of the aortic arch. Mild S-shaped thoracolumbar scoliosis. No pleural effusion. Stable confluence of density in the right upper lobe. Improvement in the right lower lobe airspace opacity. IMPRESSION: 1. Improvement in the right lower lobe airspace opacity. 2. Stable abnormal interstitial accentuation in the lungs. There is some confluence in the right upper lung and left mid lung, such that pulmonary nodules are not readily excluded. 3.  Aortic Atherosclerosis (ICD10-I70.0). Electronically Signed   By: Van Clines M.D.   On: 10/02/2017 17:19    Labs:  CBC: Recent Labs    08/14/17 0919 09/05/17 1134 09/25/17 1023 10/08/17 1203  WBC 10.2* 7.9 6.5 18.5*  HGB 8.9* 10.2* 10.7* 11.8*  HCT 28.2* 31.6* 33.4* 37.3  PLT 388 214.0 227 285    COAGS: Recent Labs    05/30/17 1754 06/19/17 2315  06/23/17 2217 06/24/17 0752 06/25/17 0228 10/08/17 1203  INR 1.13 2.28  --   --   --   --  1.03  APTT 43* 69*   < > 34 59* 99* 32   < > = values in this  interval not displayed.    BMP: Recent Labs    07/01/17 0643 07/02/17 0640 07/10/17 1049 08/14/17 0919 09/25/17 1023  NA 135 137 131* 139 138  K 4.6 4.4 4.6 4.4 4.7  CL 96* 98* 97 101 102  CO2 31 31 30 27 30   GLUCOSE 95 99 104* 107 101*  BUN 16 17 11 10 11   CALCIUM 8.3* 8.2* 8.5 8.6 8.9  CREATININE 1.32* 0.85 0.78 0.98 0.78  GFRNONAA 39* >60  --  56* >60  GFRAA 45* >60  --  >60 >60    LIVER FUNCTION TESTS: Recent Labs    06/29/17 0725 07/10/17 1049 08/14/17 0919 09/25/17 1023  BILITOT 0.4 0.4 0.3 0.2*  AST 27 17 17  62*  ALT 64* 13 10 34  ALKPHOS 179* 229* 94 145*  PROT 5.4* 6.9 7.3 6.8  ALBUMIN 2.4* 3.0* 2.9* 3.6    TUMOR MARKERS: No results for input(s): AFPTM, CEA, CA199, CHROMGRNA in the last 8760 hours.  Assessment and Plan: Chronic anemia requiring iron infusions and lab draws. Poor venous access Labs reviewed. WBC up to 18.5, normal last week. Uncertain etiology Will check UA Need to d/w Dr. Annamaria Boots is okay to proceed.  Thank you for this interesting consult.  I greatly enjoyed meeting Becky H  Ross and look forward to participating in their care.  A copy of this report was sent to the requesting provider on this date.  Electronically Signed: Ascencion Dike, PA-C 10/08/2017, 12:48 PM   I spent a total of 20 minutes in face to face in clinical consultation, greater than 50% of which was counseling/coordinating care for port placement

## 2017-10-09 ENCOUNTER — Inpatient Hospital Stay: Payer: PPO

## 2017-10-09 ENCOUNTER — Other Ambulatory Visit: Payer: Self-pay | Admitting: Family

## 2017-10-09 DIAGNOSIS — N3000 Acute cystitis without hematuria: Secondary | ICD-10-CM

## 2017-10-09 MED ORDER — SULFAMETHOXAZOLE-TRIMETHOPRIM 800-160 MG PO TABS: 1.0000 | ORAL_TABLET | Freq: Two times a day (BID) | ORAL | 0 refills | Status: DC

## 2017-10-10 DIAGNOSIS — I4891 Unspecified atrial fibrillation: Secondary | ICD-10-CM | POA: Diagnosis not present

## 2017-10-10 DIAGNOSIS — J449 Chronic obstructive pulmonary disease, unspecified: Secondary | ICD-10-CM | POA: Diagnosis not present

## 2017-10-11 ENCOUNTER — Other Ambulatory Visit: Payer: Self-pay | Admitting: Student

## 2017-10-11 LAB — URINE CULTURE
Culture: >=100000 — AB
Special Requests: NORMAL

## 2017-10-14 ENCOUNTER — Telehealth: Payer: Self-pay

## 2017-10-14 ENCOUNTER — Other Ambulatory Visit: Payer: Self-pay | Admitting: Family

## 2017-10-14 ENCOUNTER — Ambulatory Visit (HOSPITAL_COMMUNITY)
Admission: RE | Admit: 2017-10-14 | Discharge: 2017-10-14 | Disposition: A | Discharge status: Home / Self Care | Payer: PPO | Source: Ambulatory Visit | Attending: Hematology & Oncology | Admitting: Hematology & Oncology

## 2017-10-14 ENCOUNTER — Encounter (HOSPITAL_COMMUNITY): Payer: Self-pay

## 2017-10-14 ENCOUNTER — Encounter: Payer: PPO | Admitting: *Deleted

## 2017-10-14 ENCOUNTER — Ambulatory Visit (HOSPITAL_COMMUNITY)
Admission: RE | Admit: 2017-10-14 | Discharge: 2017-10-14 | Disposition: A | Discharge status: Home / Self Care | Payer: PPO | Source: Ambulatory Visit | Attending: Family | Admitting: Family

## 2017-10-14 DIAGNOSIS — K219 Gastro-esophageal reflux disease without esophagitis: Secondary | ICD-10-CM | POA: Diagnosis not present

## 2017-10-14 DIAGNOSIS — I878 Other specified disorders of veins: Secondary | ICD-10-CM

## 2017-10-14 DIAGNOSIS — I509 Heart failure, unspecified: Secondary | ICD-10-CM | POA: Diagnosis not present

## 2017-10-14 DIAGNOSIS — Z950 Presence of cardiac pacemaker: Secondary | ICD-10-CM | POA: Diagnosis not present

## 2017-10-14 DIAGNOSIS — Z8614 Personal history of Methicillin resistant Staphylococcus aureus infection: Secondary | ICD-10-CM | POA: Insufficient documentation

## 2017-10-14 DIAGNOSIS — I110 Hypertensive heart disease with heart failure: Secondary | ICD-10-CM | POA: Diagnosis not present

## 2017-10-14 DIAGNOSIS — M797 Fibromyalgia: Secondary | ICD-10-CM | POA: Insufficient documentation

## 2017-10-14 DIAGNOSIS — Z86718 Personal history of other venous thrombosis and embolism: Secondary | ICD-10-CM | POA: Diagnosis not present

## 2017-10-14 DIAGNOSIS — Z87891 Personal history of nicotine dependence: Secondary | ICD-10-CM | POA: Insufficient documentation

## 2017-10-14 DIAGNOSIS — I2510 Atherosclerotic heart disease of native coronary artery without angina pectoris: Secondary | ICD-10-CM | POA: Insufficient documentation

## 2017-10-14 DIAGNOSIS — D500 Iron deficiency anemia secondary to blood loss (chronic): Secondary | ICD-10-CM

## 2017-10-14 DIAGNOSIS — I420 Dilated cardiomyopathy: Secondary | ICD-10-CM | POA: Insufficient documentation

## 2017-10-14 DIAGNOSIS — Z452 Encounter for adjustment and management of vascular access device: Secondary | ICD-10-CM | POA: Diagnosis not present

## 2017-10-14 DIAGNOSIS — I4891 Unspecified atrial fibrillation: Secondary | ICD-10-CM | POA: Diagnosis not present

## 2017-10-14 DIAGNOSIS — K3184 Gastroparesis: Secondary | ICD-10-CM | POA: Diagnosis not present

## 2017-10-14 DIAGNOSIS — D509 Iron deficiency anemia, unspecified: Secondary | ICD-10-CM | POA: Diagnosis not present

## 2017-10-14 DIAGNOSIS — Z9981 Dependence on supplemental oxygen: Secondary | ICD-10-CM | POA: Diagnosis not present

## 2017-10-14 DIAGNOSIS — G2581 Restless legs syndrome: Secondary | ICD-10-CM | POA: Diagnosis not present

## 2017-10-14 DIAGNOSIS — J449 Chronic obstructive pulmonary disease, unspecified: Secondary | ICD-10-CM | POA: Insufficient documentation

## 2017-10-14 DIAGNOSIS — Z7901 Long term (current) use of anticoagulants: Secondary | ICD-10-CM | POA: Diagnosis not present

## 2017-10-14 HISTORY — PX: IR IMAGING GUIDED PORT INSERTION: IMG5740

## 2017-10-14 LAB — CBC
HCT: 35.3 % — ABNORMAL LOW (ref 36.0–46.0)
Hemoglobin: 11.4 g/dL — ABNORMAL LOW (ref 12.0–15.0)
MCH: 29.2 pg (ref 26.0–34.0)
MCHC: 32.3 g/dL (ref 30.0–36.0)
MCV: 90.3 fL (ref 78.0–100.0)
Platelets: 304 10*3/uL (ref 150–400)
RBC: 3.91 MIL/uL (ref 3.87–5.11)
RDW: 15.4 % (ref 11.5–15.5)
WBC: 7.5 10*3/uL (ref 4.0–10.5)

## 2017-10-14 LAB — GLUCOSE, CAPILLARY: Glucose-Capillary: 83 mg/dL (ref 70–99)

## 2017-10-14 LAB — APTT: aPTT: 29 seconds (ref 24–36)

## 2017-10-14 LAB — PROTIME-INR
INR: 0.98
Prothrombin Time: 12.9 seconds (ref 11.4–15.2)

## 2017-10-14 MED ORDER — FENTANYL CITRATE (PF) 100 MCG/2ML IJ SOLN
INTRAMUSCULAR | Status: AC
  Filled 2017-10-14: qty 2

## 2017-10-14 MED ORDER — LIDOCAINE-EPINEPHRINE (PF) 2 %-1:200000 IJ SOLN
INTRAMUSCULAR | Status: AC | PRN
  Administered 2017-10-14: 20 mL

## 2017-10-14 MED ORDER — CEFAZOLIN SODIUM-DEXTROSE 2-4 GM/100ML-% IV SOLN
INTRAVENOUS | Status: AC
  Administered 2017-10-14: 2 g via INTRAVENOUS
  Filled 2017-10-14: qty 100

## 2017-10-14 MED ORDER — HEPARIN SOD (PORK) LOCK FLUSH 100 UNIT/ML IV SOLN
INTRAVENOUS | Status: AC | PRN
  Administered 2017-10-14: 500 [IU] via INTRAVENOUS

## 2017-10-14 MED ORDER — MIDAZOLAM HCL 2 MG/2ML IJ SOLN
INTRAMUSCULAR | Status: AC | PRN
  Administered 2017-10-14 (×3): 1 mg via INTRAVENOUS

## 2017-10-14 MED ORDER — LIDOCAINE-EPINEPHRINE (PF) 2 %-1:200000 IJ SOLN
INTRAMUSCULAR | Status: AC
  Filled 2017-10-14: qty 20

## 2017-10-14 MED ORDER — MIDAZOLAM HCL 2 MG/2ML IJ SOLN
INTRAMUSCULAR | Status: AC
  Filled 2017-10-14: qty 4

## 2017-10-14 MED ORDER — FENTANYL CITRATE (PF) 100 MCG/2ML IJ SOLN
INTRAMUSCULAR | Status: AC | PRN
  Administered 2017-10-14 (×2): 50 ug via INTRAVENOUS

## 2017-10-14 MED ORDER — HEPARIN SOD (PORK) LOCK FLUSH 100 UNIT/ML IV SOLN
INTRAVENOUS | Status: AC
  Filled 2017-10-14: qty 5

## 2017-10-14 MED ORDER — LIDOCAINE-EPINEPHRINE (PF) 2 %-1:200000 IJ SOLN
INTRAMUSCULAR | Status: AC | PRN
  Administered 2017-10-14: 10 mL

## 2017-10-14 MED ORDER — CEFAZOLIN SODIUM-DEXTROSE 2-4 GM/100ML-% IV SOLN
2.0000 g | INTRAVENOUS | Status: AC
  Administered 2017-10-14: 2 g via INTRAVENOUS

## 2017-10-14 MED ORDER — SODIUM CHLORIDE 0.9 % IV SOLN
INTRAVENOUS | Status: DC
  Administered 2017-10-14: 13:00:00 via INTRAVENOUS

## 2017-10-14 NOTE — Telephone Encounter (Signed)
LMOVM reminding pt to send remote transmission.   

## 2017-10-14 NOTE — Progress Notes (Signed)
Referring Physician(s): [place],Sarah M/Ennever,P  Supervising Physician: Sandi Mariscal  Patient Status:  [facility]  Chief Complaint:  "I'm here to get a port a cath"  Subjective: Patient familiar to IR service from prior right thoracentesis 2009, multiple PICC placements, and prior gastrostomy tube exchanges.  She has a history of iron deficiency anemia and poor venous access.  She was also seen recently on 7/23 for Port-A-Cath placement ,however procedure had to be postponed secondary to active UTI.  She has since been treated.  She presents again today for Port-A-Cath placement.  She currently denies fever, headache, chest pain, cough, abdominal/back pain, nausea, vomiting, dysuria/hematuria.  She is an ex-smoker, has COPD and is on home O2.  She does have some dyspnea with exertion.  Past Medical History:  Diagnosis Date  . Anemia   . Arthritis    right hip  . Atrial fibrillation ([place])   . CAD (coronary artery disease)    mild disease per cath in 2011  . CHF (congestive heart failure) ([place])   . COPD (chronic obstructive pulmonary disease) (H[facility])    continuous O2- 2l  . Diverticulitis   . Elbow fracture, left aug 2012  . Fibromyalgia    COUPLE OF TIMES A YEAR-PT HAS EPISODES OF CONFUSION-USUALLY INVOLVES DAY/NIGHT REVERSAL AND EPISODES OF TWITCHING AND FALLS--SEES DR. Jacelyn Grip - NEUROLOGIST-LAST SEEN 07/10/11  . Gastroparesis   . GERD (gastroesophageal reflux disease)   . H/O: GI bleed    from Pradaxa  . Hepatitis    hx of in high school   . HTN (hypertension)    under control; has been on med. x "years"  . Hx MRSA infection   . Hx of gastric ulcer   . Hx of stress fx aug 2011   right hip   . Iron malabsorption 08/14/2017  . LBBB (left bundle branch block)   . Oxygen dependent    2 liters via nasal cannula at all times  . Pacemaker    CRT therapy; followed by Dr. Caryl Comes  . Pleural effusion     s/p right thoracentesis 03/09  . Pneumonia - 03/09, 12/09, 02/10, 06/10   MOST RECENT FEB 2013  . Primary dilated cardiomyopathy (H[facility])    EF 45 to 50% per echo in Jan 2012  . RLS (restless legs syndrome)   . Silent aspiration   . Small bowel obstruction ([place])   . Urinary incontinence     -INTERSTIM IMPLANT NOT FUNCTIONING PER PT  . Venous embolism and thrombosis of subclavian vein ([place])    after pacemaker insertion Oct 2011  . Vitamin B12 deficiency    Past Surgical History:  Procedure Laterality Date  . APPENDECTOMY    . CARDIAC CATHETERIZATION  04/08/2006, 11/16/2009  . CHOLECYSTECTOMY N/A 06/27/2017   Procedure: OPEN CHOLECYSTECTOMY;  Surgeon: Ralene Ok, MD;  Location: [facility];  Service: General;  Laterality: N/A;  . COLECTOMY     Intestinal resection (5times)  . COLECTOMY  02/04/2000   ex. lap., intra-abd. subtotal colectomy with ileosigmoid colon anastomosies and lysis of adhesions  . CYSTOSCOPY  11/11/2008  . CYSTOSCOPY W/ RETROGRADES  11/11/2008   right  . CYSTOSCOPY WITH INJECTION  04/30/2006   transurethral collagen injection; incision vaginal stenosis  . ERCP N/A 06/23/2017   Procedure: ENDOSCOPIC RETROGRADE CHOLANGIOPANCREATOGRAPHY (ERCP);  Surgeon: Carol Ada, MD;  Location: [facility];  Service: Endoscopy;  Laterality: N/A;  . ERCP N/A 06/24/2017   Procedure: ENDOSCOPIC RETROGRADE CHOLANGIOPANCREATOGRAPHY (ERCP);  Surgeon: [name], MD;  Location: [facility];  Service: Endoscopy;  Laterality: N/A;  . ESOPHAGOGASTRODUODENOSCOPY N/A 02/02/2013   Procedure: ESOPHAGOGASTRODUODENOSCOPY (EGD);  Surgeon: Lafayette Dragon, MD;  Location: [facility];  Service: Endoscopy;  Laterality: N/A;  . EXPLORATORY LAPAROTOMY  04/27/2009   lysis of adhesions, gastrostomy tube  . GASTROCUTANEOUS FISTULA CLOSURE    . INTERSTIM IMPLANT PLACEMENT  05/28/2006 - stage I   06/05/2006 - stage II  . INTERSTIM IMPLANT PLACEMENT  02/05/2012   Procedure: [name] IMPLANT FIRST STAGE;  Surgeon: Reece Packer, MD;  Location: [facility];  Service: Urology;  Laterality:  N/A;  Replacement of Interstim Lead     . INTERSTIM IMPLANT REVISION  03/06/2011   Procedure: REVISION OF [name];  Surgeon: Reece Packer, MD;  [facility];  Service: Urology;  Laterality: N/A;  Replacement of Neurostimulator  . INTERSTIM IMPLANT REVISION  10/23/2007  . PACEMAKER PLACEMENT  12/21/2009  . Peg removed     with complications  . PORT-A-CATH REMOVAL  07/27/2011   Procedure: REMOVAL PORT-A-CATH;  Surgeon: Rolm Bookbinder, MD;  Location: [facility];  Service: General;  Laterality: N/A;  . PORTACATH PLACEMENT  12/16/2009  . SAVORY DILATION N/A 02/02/2013   Procedure: SAVORY DILATION;  Surgeon: Lafayette Dragon, MD;  Location[facility];  Service: Endoscopy;  Laterality: N/A;  . SMALL INTESTINE SURGERY  05/20/2001   ex. lap., resection of small bowel stricture; gastrostomy; insertion central line  . TONSILLECTOMY  age 72  . TOTAL ABDOMINAL HYSTERECTOMY     complete  . TOTAL HIP ARTHROPLASTY  08/03/2011   Procedure: TOTAL HIP ARTHROPLASTY ANTERIOR APPROACH;  Surgeon: Mcarthur Rossetti, MD;  Location[facility];  Service: Orthopedics;  Laterality: Right;      Allergies: Dabigatran etexilate mesylate; Talwin [pentazocine]; and Lactate  Medications: Prior to Admission medications   Medication Sig Start Date End Date Taking? Authorizing Provider  acetaminophen (TYLENOL) 325 MG tablet Take 2 tablets (650 mg total) by mouth every 4 (four) hours as needed. 07/01/17  Yes Saverio Danker, PA-C  amLODipine (NORVASC) 2.5 MG tablet Take 1 tablet (2.5 mg total) by mouth daily. 09/09/17  Yes Minus Breeding, MD  atorvastatin (LIPITOR) 20 MG tablet Take 1 tablet (20 mg total) by mouth daily at 6 PM. 07/08/17  Yes Carollee Herter, Yvonne R, DO  carvedilol (COREG) 25 MG tablet TAKE 1 TABLET BY MOUTH TWICE DAILY 08/23/17  Yes Minus Breeding, MD  gabapentin (NEURONTIN) 600 MG tablet Take 1 tablet (600 mg total) by mouth 3 (three) times daily. 06/01/17  Yes Aline August, MD    oxyCODONE-acetaminophen (PERCOCET) 10-325 MG tablet Take 1 tablet by mouth every 4 (four) hours as needed. PAIN 09/17/17  Yes Mosie Lukes, MD  pantoprazole (PROTONIX) 40 MG tablet Take 1 tablet (40 mg total) by mouth 2 (two) times daily. 06/01/17  Yes Aline August, MD  promethazine (PHENERGAN) 25 MG tablet TAKE ONE TABLET BY MOUTH EVERY 8 HOURS AS NEEDED FOR NAUSEA 09/18/17  Yes Gatha Mayer, MD  SPIRIVA RESPIMAT 1.25 MCG/ACT AERS INHALE 2 PUFFS INTO THE LUNGS DAILY 07/08/17  Yes Chesley Mires, MD  sulfamethoxazole-trimethoprim (BACTRIM DS,SEPTRA DS) 800-160 MG tablet Take 1 tablet by mouth 2 (two) times daily. 10/09/17  Yes [place], Holli Humbles, NP  tiZANidine (ZANAFLEX) 4 MG tablet Take 1 tablet (4 mg total) by mouth 3 (three) times daily as needed. for muscle spams 07/08/17  Yes Roma Schanz R, DO  zolpidem (AMBIEN) 5 MG tablet 1 po qhs prn  07/08/17  Yes Ann Held, DO  apixaban (ELIQUIS) 5 MG TABS tablet Take 1 tablet (5 mg total) by mouth 2 (two) times daily. 07/08/17   Ann Held, DO  denosumab (PROLIA) 60 MG/ML SOSY injection Inject 60 mg into the skin every 6 (six) months. 03/25/15   [provider]  furosemide (LASIX) 40 MG tablet TAKE ONE TABLET (40mg ) BY MOUTH IN THE MORNING 07/30/17   Minus Breeding, MD  nitroGLYCERIN (NITROSTAT) 0.4 MG SL tablet Place 1 tablet (0.4 mg total) under the tongue every 5 (five) minutes as needed. CHEST PAIN 05/15/17   Deboraha Sprang, MD  polyethylene glycol [facility] / Floria Raveling) packet Take 17 g by mouth daily. 07/02/17   Lavina Hamman, MD  iron dextran complex (INFED) 50 MG/ML injection Please give infed infusion (no test dose needed) over 4 hours per pharmacy calculated dose. Ht: 5'2 Wt: 99 pounds Hgb: 12 12/20/10 06/05/11  Lafayette Dragon, MD     Vital Signs: BP (!) 152/71 (BP Location: Right Arm)   Pulse 73   Temp 98.3 F (36.8 C) (Oral)   Resp 16   SpO2 100%   Physical Exam thin white female in no acute  distress.  Chest clear to auscultation bilaterally.  Heart with regular rate and rhythm.  Left chest wall pacer.  Abdomen soft, positive bowel sounds, nontender.  No lower extremity edema.  Imaging: No results found.  Labs:  CBC: Recent Labs    09/05/17 1134 09/25/17 1023 10/08/17 1203 10/14/17 1249  WBC 7.9 6.5 18.5* 7.5  HGB 10.2* 10.7* 11.8* 11.4*  HCT 31.6* 33.4* 37.3 35.3*  PLT 214.0 227 285 304    COAGS: Recent Labs    05/30/17 1754 06/19/17 2315  06/24/17 0752 06/25/17 0228 10/08/17 1203 10/14/17 1249  INR 1.13 2.28  --   --   --  1.03 0.98  APTT 43* 69*   < > 59* 99* 32 29   < > = values in this interval not displayed.    BMP: Recent Labs    07/01/17 0643 07/02/17 0640 07/10/17 1049 08/14/17 0919 09/25/17 1023  NA 135 137 131* 139 138  K 4.6 4.4 4.6 4.4 4.7  CL 96* 98* 97 101 102  CO2 31 31 30 27 30   GLUCOSE 95 99 104* 107 101*  BUN 16 17 11 10 11   CALCIUM 8.3* 8.2* 8.5 8.6 8.9  CREATININE 1.32* 0.85 0.78 0.98 0.78  GFRNONAA 39* >60  --  56* >60  GFRAA 45* >60  --  >60 >60    LIVER FUNCTION TESTS: Recent Labs    06/29/17 0725 07/10/17 1049 08/14/17 0919 09/25/17 1023  BILITOT 0.4 0.4 0.3 0.2*  AST 27 17 17  62*  ALT 64* 13 10 34  ALKPHOS 179* 229* 94 145*  PROT 5.4* 6.9 7.3 6.8  ALBUMIN 2.4* 3.0* 2.9* 3.6    Assessment and Plan: Pt with history of iron deficiency anemia and poor venous access.  She was also seen recently on 10/08/17 for Port-A-Cath placement ,however procedure had to be postponed secondary to active UTI.  She has since been treated.  She presents again today for Port-A-Cath placement. Risks and benefits of image guided port-a-catheter placement was discussed with the patient/spouse including, but not limited to bleeding, infection, pneumothorax, or fibrin sheath development and need for additional procedures. WBC today 7.5, afebrile.  All of the patient's questions were answered, patient is agreeable to proceed. Consent  signed and in  chart.     Electronically Signed: D. Rowe Robert, PA-C 10/14/2017, 2:20 PM   I spent a total of 20 minutes at the the patient's bedside AND on the patient's hospital floor or unit, greater than 50% of which was counseling/coordinating care for port a cath placement    Patient ID: [name], female   DOB: 1946/02/06, 72 y.o.   MRN: [id number]

## 2017-10-14 NOTE — Procedures (Signed)
Pre Procedure Dx: Poor venous access Post Procedural Dx: Same  Successful placement of right IJ approach port-a-cath with tip at the superior caval atrial junction. The catheter is ready for immediate use.  Estimated Blood Loss: Minimal  Complications: None immediate.  Jay Inella Kuwahara, MD Pager #: [phone]   

## 2017-10-14 NOTE — Discharge Instructions (Signed)
Moderate Conscious Sedation, Adult, Care After °These instructions provide you with information about caring for yourself after your procedure. Your health care provider may also give you more specific instructions. Your treatment has been planned according to current medical practices, but problems sometimes occur. Call your health care provider if you have any problems or questions after your procedure. °What can I expect after the procedure? °After your procedure, it is common: °· To feel sleepy for several hours. °· To feel clumsy and have poor balance for several hours. °· To have poor judgment for several hours. °· To vomit if you eat too soon. ° °Follow these instructions at home: °For at least 24 hours after the procedure: ° °· Do not: °? Participate in activities where you could fall or become injured. °? Drive. °? Use heavy machinery. °? Drink alcohol. °? Take sleeping pills or medicines that cause drowsiness. °? Make important decisions or sign legal documents. °? Take care of children on your own. °· Rest. °Eating and drinking °· Follow the diet recommended by your health care provider. °· If you vomit: °? Drink water, juice, or soup when you can drink without vomiting. °? Make sure you have little or no nausea before eating solid foods. °General instructions °· Have a responsible adult stay with you until you are awake and alert. °· Take over-the-counter and prescription medicines only as told by your health care provider. °· If you smoke, do not smoke without supervision. °· Keep all follow-up visits as told by your health care provider. This is important. °Contact a health care provider if: °· You keep feeling nauseous or you keep vomiting. °· You feel light-headed. °· You develop a rash. °· You have a fever. °Get help right away if: °· You have trouble breathing. °This information is not intended to replace advice given to you by your health care provider. Make sure you discuss any questions you have  with your health care provider. °Document Released: 12/24/2012 Document Revised: 08/08/2015 Document Reviewed: 06/25/2015 °Elsevier Interactive Patient Education © 2018 Elsevier Inc[employer] ° ° °Implanted Port Insertion, Care After °This sheet gives you information about how to care for yourself after your procedure. Your health care provider may also give you more specific instructions. If you have problems or questions, contact your health care provider. °What can I expect after the procedure? °After your procedure, it is common to have: °· Discomfort at the port insertion site. °· Bruising on the skin over the port. This should improve over 3-4 days. ° °Follow these instructions at home: °Port care °· After your port is placed, you will get a manufacturer's information card. The card has information about your port. Keep this card with you at all times. °· Take care of the port as told by your health care provider. Ask your health care provider if you or a family member can get training for taking care of the port at home. A home health care nurse may also take care of the port. °· Make sure to remember what type of port you have. °Incision care °· Follow instructions from your health care provider about how to take care of your port insertion site. Make sure you: °? Wash your hands with soap and water before you change your bandage (dressing). If soap and water are not available, use hand sanitizer. °? Change your dressing as told by your health care provider.  You may remove your dressing tomorrow. °? Leave skin glue in place. These skin closures   may need to stay in place for 2 weeks or longer. If adhesive strip edges start to loosen and curl up, you may trim the loose edges. Do not remove adhesive strips completely unless your health care provider tells you to do that.  DO NOT use EMLA cream for 2 weeks after port placement as this cream will remove surgical glue covering your incision.  Check your port insertion  site every day for signs of infection. Check for: ? More redness, swelling, or pain. ? More fluid or blood. ? Warmth. ? Pus or a bad smell. General instructions  Do not take baths, swim, or use a hot tub until your health care provider approves.  You may shower tomorrow.  Do not lift anything that is heavier than 10 lb (4.5 kg) for a week, or as told by your health care provider.  Ask your health care provider when it is okay to: ? Return to work or school. ? Resume usual physical activities or sports.  Do not drive for 24 hours if you were given a medicine to help you relax (sedative).  Take over-the-counter and prescription medicines only as told by your health care provider.  Wear a medical alert bracelet in case of an emergency. This will tell any health care providers that you have a port.  Keep all follow-up visits as told by your health care provider. This is important. Contact a health care provider if:  You cannot flush your port with saline as directed, or you cannot draw blood from the port.  You have a fever or chills.  You have more redness, swelling, or pain around your port insertion site.  You have more fluid or blood coming from your port insertion site.  Your port insertion site feels warm to the touch.  You have pus or a bad smell coming from the port insertion site. Get help right away if:  You have chest pain or shortness of breath.  You have bleeding from your port that you cannot control. Summary  Take care of the port as told by your health care provider.  Change your dressing as told by your health care provider.  Keep all follow-up visits as told by your health care provider. This information is not intended to replace advice given to you by your health care provider. Make sure you discuss any questions you have with your health care provider. Document Released: 12/24/2012 Document Revised: 01/25/2016 Document Reviewed: 01/25/2016 Elsevier  Interactive Patient Education  2017 [employer].

## 2017-10-16 ENCOUNTER — Encounter: Payer: Self-pay | Admitting: Cardiology

## 2017-10-16 ENCOUNTER — Other Ambulatory Visit: Payer: Self-pay | Admitting: *Deleted

## 2017-10-16 ENCOUNTER — Inpatient Hospital Stay: Payer: PPO

## 2017-10-16 ENCOUNTER — Other Ambulatory Visit: Payer: Self-pay

## 2017-10-16 DIAGNOSIS — R52 Pain, unspecified: Secondary | ICD-10-CM

## 2017-10-16 MED ORDER — OXYCODONE-ACETAMINOPHEN 10-325 MG PO TABS: 1.0000 | ORAL_TABLET | ORAL | 0 refills | Status: DC | PRN

## 2017-10-16 MED ORDER — LIDOCAINE-PRILOCAINE 2.5-2.5 % EX CREA
1.0000 | TOPICAL_CREAM | CUTANEOUS | 6 refills | Status: DC | PRN
Start: 2017-10-16 — End: 2021-01-16

## 2017-10-16 NOTE — Telephone Encounter (Signed)
Received refill request for oxyCODONE-acetaminophen (PERCOCET) 10-325 MG tablet. Last office visit 09/05/17 and last refill 09/17/17.   Contract: 03/07/17 UDS: low risk, due to repeat on 09/05/17.

## 2017-10-21 ENCOUNTER — Ambulatory Visit (INDEPENDENT_AMBULATORY_CARE_PROVIDER_SITE_OTHER): Payer: PPO | Admitting: *Deleted

## 2017-10-21 DIAGNOSIS — I480 Paroxysmal atrial fibrillation: Secondary | ICD-10-CM | POA: Diagnosis not present

## 2017-10-21 DIAGNOSIS — I428 Other cardiomyopathies: Secondary | ICD-10-CM

## 2017-10-22 NOTE — Progress Notes (Signed)
Remote pacemaker transmission.   

## 2017-10-23 ENCOUNTER — Other Ambulatory Visit: Payer: Self-pay | Admitting: Family

## 2017-10-23 ENCOUNTER — Other Ambulatory Visit: Payer: Self-pay

## 2017-10-23 ENCOUNTER — Inpatient Hospital Stay: Payer: PPO | Attending: Family

## 2017-10-23 ENCOUNTER — Telehealth: Payer: Self-pay

## 2017-10-23 VITALS — BP 147/58 | HR 64 | Temp 98.7°F | Resp 18

## 2017-10-23 DIAGNOSIS — D508 Other iron deficiency anemias: Secondary | ICD-10-CM | POA: Diagnosis present

## 2017-10-23 DIAGNOSIS — D500 Iron deficiency anemia secondary to blood loss (chronic): Secondary | ICD-10-CM

## 2017-10-23 DIAGNOSIS — K909 Intestinal malabsorption, unspecified: Secondary | ICD-10-CM | POA: Insufficient documentation

## 2017-10-23 DIAGNOSIS — Z7901 Long term (current) use of anticoagulants: Secondary | ICD-10-CM | POA: Insufficient documentation

## 2017-10-23 MED ORDER — SODIUM CHLORIDE 0.9 % IV SOLN
510.0000 mg | Freq: Once | INTRAVENOUS | Status: AC
  Administered 2017-10-23: 510 mg via INTRAVENOUS
  Filled 2017-10-23: qty 17

## 2017-10-23 MED ORDER — SODIUM CHLORIDE 0.9 % IV SOLN
INTRAVENOUS | Status: DC
  Administered 2017-10-23: 14:00:00 via INTRAVENOUS
  Filled 2017-10-23: qty 250

## 2017-10-23 MED ORDER — SODIUM CHLORIDE 0.9% FLUSH
10.0000 mL | INTRAVENOUS | Status: DC | PRN
  Administered 2017-10-23: 10 mL
  Filled 2017-10-23: qty 10

## 2017-10-23 MED ORDER — HEPARIN SOD (PORK) LOCK FLUSH 100 UNIT/ML IV SOLN
500.0000 [IU] | Freq: Once | INTRAVENOUS | Status: AC | PRN
  Administered 2017-10-23: 500 [IU]
  Filled 2017-10-23: qty 5

## 2017-10-23 NOTE — Progress Notes (Signed)
After de-accessing her port today, it took approximately 30 minutes for this to clot. She is on Eliquis. I spoke with Dr. Marin Olp and we will have her start holding this the day before and restart the evening of days she comes in for port flush and/or infusion. She verbalized understanding and agreement with this.

## 2017-10-23 NOTE — Telephone Encounter (Signed)
portacath tip placement verified per Imaging result prior to iron infusion today.  Tip in superior cavoatrial junction.

## 2017-10-23 NOTE — Progress Notes (Signed)
Patient was bleeding from the port after needle was removed. Instructed to apply pressure. Bleeding stopped in approx. 30 min. Laverna Peace, NP aware.

## 2017-10-30 ENCOUNTER — Inpatient Hospital Stay: Payer: PPO

## 2017-10-30 VITALS — BP 138/52 | HR 64 | Temp 97.8°F | Resp 18

## 2017-10-30 DIAGNOSIS — D508 Other iron deficiency anemias: Secondary | ICD-10-CM | POA: Diagnosis not present

## 2017-10-30 DIAGNOSIS — D500 Iron deficiency anemia secondary to blood loss (chronic): Secondary | ICD-10-CM

## 2017-10-30 MED ORDER — SODIUM CHLORIDE 0.9% FLUSH
10.0000 mL | INTRAVENOUS | Status: DC | PRN
  Administered 2017-10-30: 10 mL via INTRAVENOUS
  Filled 2017-10-30: qty 10

## 2017-10-30 MED ORDER — HEPARIN SOD (PORK) LOCK FLUSH 100 UNIT/ML IV SOLN
500.0000 [IU] | Freq: Once | INTRAVENOUS | Status: AC
  Administered 2017-10-30: 500 [IU] via INTRAVENOUS
  Filled 2017-10-30: qty 5

## 2017-10-30 MED ORDER — FERUMOXYTOL INJECTION 510 MG/17 ML
510.0000 mg | Freq: Once | INTRAVENOUS | Status: AC
  Administered 2017-10-30: 510 mg via INTRAVENOUS
  Filled 2017-10-30: qty 17

## 2017-10-30 MED ORDER — SODIUM CHLORIDE 0.9 % IV SOLN
INTRAVENOUS | Status: DC
  Administered 2017-10-30: 13:00:00 via INTRAVENOUS
  Filled 2017-10-30: qty 250

## 2017-10-30 NOTE — Patient Instructions (Signed)

## 2017-11-05 LAB — CUP PACEART REMOTE DEVICE CHECK
Battery Remaining Longevity: 16 mo
Battery Voltage: 2.89 V
Brady Statistic AP VP Percent: 2.61 %
Brady Statistic AP VS Percent: 0.05 %
Brady Statistic AS VP Percent: 97.26 %
Brady Statistic AS VS Percent: 0.08 %
Brady Statistic RA Percent Paced: 2.64 %
Brady Statistic RV Percent Paced: 99.84 %
Date Time Interrogation Session: 20190805233523
Implantable Lead Implant Date: 20111005
Implantable Lead Implant Date: 20111005
Implantable Lead Implant Date: 20111005
Implantable Lead Location: 753858
Implantable Lead Location: 753859
Implantable Lead Location: 753860
Implantable Lead Model: 4196
Implantable Lead Model: 5076
Implantable Lead Model: 5076
Implantable Pulse Generator Implant Date: 20111005
Lead Channel Impedance Value: 323 Ohm
Lead Channel Impedance Value: 361 Ohm
Lead Channel Impedance Value: 361 Ohm
Lead Channel Impedance Value: 418 Ohm
Lead Channel Impedance Value: 437 Ohm
Lead Channel Impedance Value: 456 Ohm
Lead Channel Impedance Value: 475 Ohm
Lead Channel Impedance Value: 494 Ohm
Lead Channel Impedance Value: 646 Ohm
Lead Channel Pacing Threshold Amplitude: 0.5 V
Lead Channel Pacing Threshold Amplitude: 0.5 V
Lead Channel Pacing Threshold Pulse Width: 0.4 ms
Lead Channel Pacing Threshold Pulse Width: 0.4 ms
Lead Channel Sensing Intrinsic Amplitude: 1 mV
Lead Channel Sensing Intrinsic Amplitude: 1 mV
Lead Channel Sensing Intrinsic Amplitude: 8 mV
Lead Channel Sensing Intrinsic Amplitude: 8 mV
Lead Channel Setting Pacing Amplitude: 1.25 V
Lead Channel Setting Pacing Amplitude: 2 V
Lead Channel Setting Pacing Amplitude: 2.5 V
Lead Channel Setting Pacing Pulse Width: 0.4 ms
Lead Channel Setting Pacing Pulse Width: 0.8 ms
Lead Channel Setting Sensing Sensitivity: 0.9 mV

## 2017-11-06 ENCOUNTER — Inpatient Hospital Stay: Payer: PPO

## 2017-11-06 ENCOUNTER — Inpatient Hospital Stay (HOSPITAL_BASED_OUTPATIENT_CLINIC_OR_DEPARTMENT_OTHER): Payer: PPO | Admitting: Family

## 2017-11-06 ENCOUNTER — Encounter: Payer: Self-pay | Admitting: Family

## 2017-11-06 ENCOUNTER — Other Ambulatory Visit: Payer: Self-pay

## 2017-11-06 VITALS — BP 151/91 | HR 67 | Temp 98.4°F | Resp 20 | Wt 90.0 lb

## 2017-11-06 DIAGNOSIS — Z7901 Long term (current) use of anticoagulants: Secondary | ICD-10-CM

## 2017-11-06 DIAGNOSIS — K909 Intestinal malabsorption, unspecified: Secondary | ICD-10-CM | POA: Diagnosis not present

## 2017-11-06 DIAGNOSIS — D508 Other iron deficiency anemias: Secondary | ICD-10-CM | POA: Diagnosis not present

## 2017-11-06 DIAGNOSIS — D500 Iron deficiency anemia secondary to blood loss (chronic): Secondary | ICD-10-CM

## 2017-11-06 LAB — CBC WITH DIFFERENTIAL (CANCER CENTER ONLY)
Basophils Absolute: 0 10*3/uL (ref 0.0–0.1)
Basophils Relative: 0 %
Eosinophils Absolute: 0.2 10*3/uL (ref 0.0–0.5)
Eosinophils Relative: 2 %
HCT: 33.9 % — ABNORMAL LOW (ref 34.8–46.6)
Hemoglobin: 10.7 g/dL — ABNORMAL LOW (ref 11.6–15.9)
Lymphocytes Relative: 7 %
Lymphs Abs: 0.4 10*3/uL — ABNORMAL LOW (ref 0.9–3.3)
MCH: 29.1 pg (ref 26.0–34.0)
MCHC: 31.6 g/dL — ABNORMAL LOW (ref 32.0–36.0)
MCV: 92.1 fL (ref 81.0–101.0)
Monocytes Absolute: 0.8 10*3/uL (ref 0.1–0.9)
Monocytes Relative: 11 %
Neutro Abs: 5.3 10*3/uL (ref 1.5–6.5)
Neutrophils Relative %: 80 %
Platelet Count: 239 10*3/uL (ref 145–400)
RBC: 3.68 MIL/uL — ABNORMAL LOW (ref 3.70–5.32)
RDW: 15.1 % (ref 11.1–15.7)
WBC Count: 6.6 10*3/uL (ref 3.9–10.0)

## 2017-11-06 LAB — CMP (CANCER CENTER ONLY)
ALT: 11 U/L (ref 0–44)
AST: 20 U/L (ref 15–41)
Albumin: 3.7 g/dL (ref 3.5–5.0)
Alkaline Phosphatase: 111 U/L (ref 38–126)
Anion gap: 7 (ref 5–15)
BUN: 11 mg/dL (ref 8–23)
CO2: 30 mmol/L (ref 22–32)
Calcium: 8.5 mg/dL — ABNORMAL LOW (ref 8.9–10.3)
Chloride: 103 mmol/L (ref 98–111)
Creatinine: 0.72 mg/dL (ref 0.44–1.00)
GFR, Est AFR Am: >60 mL/min (ref >60)
GFR, Estimated: >60 mL/min (ref >60)
Glucose, Bld: 107 mg/dL — ABNORMAL HIGH (ref 70–99)
Potassium: 4.5 mmol/L (ref 3.5–5.1)
Sodium: 140 mmol/L (ref 135–145)
Total Bilirubin: <0.2 mg/dL — ABNORMAL LOW (ref 0.3–1.2)
Total Protein: 6.9 g/dL (ref 6.5–8.1)

## 2017-11-06 LAB — RETICULOCYTES
RBC.: 3.63 MIL/uL — ABNORMAL LOW (ref 3.70–5.45)
Retic Count, Absolute: 47.2 10*3/uL (ref 33.7–90.7)
Retic Ct Pct: 1.3 % (ref 0.7–2.1)

## 2017-11-06 MED ORDER — HEPARIN SOD (PORK) LOCK FLUSH 100 UNIT/ML IV SOLN
500.0000 [IU] | Freq: Once | INTRAVENOUS | Status: AC
  Administered 2017-11-06: 500 [IU] via INTRAVENOUS
  Filled 2017-11-06: qty 5

## 2017-11-06 MED ORDER — SODIUM CHLORIDE 0.9% FLUSH
10.0000 mL | INTRAVENOUS | Status: DC | PRN
  Administered 2017-11-06: 10 mL via INTRAVENOUS
  Filled 2017-11-06: qty 10

## 2017-11-06 NOTE — Progress Notes (Addendum)
Hematology and Oncology Follow Up Visit  [name] [id number] 1946/02/12 72 y.o. 11/06/2017   Principle Diagnosis:  Iron deficiency anemia  Current Therapy:   IV iron as indicated - last received in August 2019 x 2   Interim History:  [name] is here today for follow-up. She is doing well but still having some fatigue and SOB with over exertion. She is stable on 3L [place] 24 hours a day.  Hgb is 10.4. She received 2 doses of IV iron earlier this month after having her port a cath placed. She really likes having this. She was accessed today by our nursing staff without any issues.  She has not noted any episodes of bleeding. She is on Eliquis and bruises easily.  No lymphadenopathy noted on exam.  No fever, chills, n/v, cough, rash, dizziness, chest pain, palpitations, abdominal pain or changes in bowel or bladder habits.  No swelling, tenderness, numbness or tingling in her extremities. No c/o pain.  She has maintained a good appetite and is staying well hydrated. Her weight is stable.   ECOG Performance Status: 1 - Symptomatic but completely ambulatory  Medications:  Allergies as of 11/06/2017      Reactions   Dabigatran Etexilate Mesylate Other (See Comments)   INTERNAL BLEEDING-pradaxa   Talwin [pentazocine] Other (See Comments)   hallucinations   Lactate Rash      Medication List        Accurate as of 11/06/17  1:44 PM. Always use your most recent med list.          acetaminophen 325 MG tablet Commonly known as:  TYLENOL Take 2 tablets (650 mg total) by mouth every 4 (four) hours as needed.   amLODipine 2.5 MG tablet Commonly known as:  NORVASC Take 1 tablet (2.5 mg total) by mouth daily.   apixaban 5 MG Tabs tablet Commonly known as:  ELIQUIS Take 1 tablet (5 mg total) by mouth 2 (two) times daily.   atorvastatin 20 MG tablet Commonly known as:  LIPITOR Take 1 tablet (20 mg total) by mouth daily at 6 PM.   carvedilol 25 MG tablet Commonly known as:   COREG TAKE 1 TABLET BY MOUTH TWICE DAILY   denosumab 60 MG/ML Sosy injection Commonly known as:  PROLIA Inject 60 mg into the skin every 6 (six) months.   furosemide 40 MG tablet Commonly known as:  LASIX TAKE ONE TABLET (40mg ) BY MOUTH IN THE MORNING   gabapentin 600 MG tablet Commonly known as:  NEURONTIN Take 1 tablet (600 mg total) by mouth 3 (three) times daily.   lidocaine-prilocaine cream Commonly known as:  EMLA Apply 1 application topically as needed.   nitroGLYCERIN 0.4 MG SL tablet Commonly known as:  NITROSTAT Place 1 tablet (0.4 mg total) under the tongue every 5 (five) minutes as needed. CHEST PAIN   oxyCODONE-acetaminophen 10-325 MG tablet Commonly known as:  PERCOCET Take 1 tablet by mouth every 4 (four) hours as needed. PAIN   pantoprazole 40 MG tablet Commonly known as:  PROTONIX Take 1 tablet (40 mg total) by mouth 2 (two) times daily.   polyethylene glycol packet Commonly known as:  MIRALAX / GLYCOLAX Take 17 g by mouth daily.   promethazine 25 MG tablet Commonly known as:  PHENERGAN TAKE ONE TABLET BY MOUTH EVERY 8 HOURS AS NEEDED FOR NAUSEA   SPIRIVA RESPIMAT 1.25 MCG/ACT Aers Generic drug:  Tiotropium Bromide Monohydrate INHALE 2 PUFFS INTO THE LUNGS DAILY   sulfamethoxazole-trimethoprim 800-160  MG tablet Commonly known as:  BACTRIM DS,SEPTRA DS Take 1 tablet by mouth 2 (two) times daily.   tiZANidine 4 MG tablet Commonly known as:  ZANAFLEX Take 1 tablet (4 mg total) by mouth 3 (three) times daily as needed. for muscle spams   zolpidem 5 MG tablet Commonly known as:  AMBIEN 1 po qhs prn       Allergies:  Allergies  Allergen Reactions  . Dabigatran Etexilate Mesylate Other (See Comments)    INTERNAL BLEEDING-pradaxa  . Talwin [Pentazocine] Other (See Comments)    hallucinations  . Lactate Rash    Past Medical History, Surgical history, Social history, and Family History were reviewed and updated.  Review of Systems: All  other 10 point review of systems is negative.   Physical Exam:  vitals were not taken for this visit.   Wt Readings from Last 3 Encounters:  10/08/17 90 lb (40.8 kg)  10/08/17 90 lb 5 oz (41 kg)  10/02/17 92 lb (41.7 kg)    Ocular: Sclerae unicteric, pupils equal, round and reactive to light Ear-nose-throat: Oropharynx clear, dentition fair Lymphatic: No cervical, supraclavicular or axillary adenopathy Lungs no rales or rhonchi, good excursion bilaterally Heart regular rate and rhythm, no murmur appreciated Abd soft, nontender, positive bowel sounds, no liver or spleen tip palpated on exam, no fluid wave  MSK no focal spinal tenderness, no joint edema Neuro: non-focal, well-oriented, appropriate affect Breasts: Deferred   Lab Results  Component Value Date   WBC 7.5 10/14/2017   HGB 11.4 (L) 10/14/2017   HCT 35.3 (L) 10/14/2017   MCV 90.3 10/14/2017   PLT 304 10/14/2017   Lab Results  Component Value Date   FERRITIN 1,195 (H) 09/25/2017   IRON 41 09/25/2017   TIBC 218 (L) 09/25/2017   UIBC 177 09/25/2017   IRONPCTSAT 19 (L) 09/25/2017   Lab Results  Component Value Date   RETICCTPCT 1.1 09/25/2017   RBC 3.91 10/14/2017   RETICCTABS 64.6 04/01/2009   No results found for: KPAFRELGTCHN, LAMBDASER, KAPLAMBRATIO No results found for: IGGSERUM, IGA, IGMSERUM No results found for: Ronnald Ramp, A1GS, A2GS, Violet Baldy, MSPIKE, SPEI   Chemistry      Component Value Date/Time   NA 138 09/25/2017 1023   K 4.7 09/25/2017 1023   CL 102 09/25/2017 1023   CO2 30 09/25/2017 1023   BUN 11 09/25/2017 1023   CREATININE 0.78 09/25/2017 1023   CREATININE 0.91 03/16/2016 1539      Component Value Date/Time   CALCIUM 8.9 09/25/2017 1023   ALKPHOS 145 (H) 09/25/2017 1023   AST 62 (H) 09/25/2017 1023   ALT 34 09/25/2017 1023   BILITOT 0.2 (L) 09/25/2017 1023      Impression and Plan: [name] is a very pleasant 72 yo caucasian female with iron  deficiency anemia secondary to malabsorption and possible GI blood loss on Eliquis.  She is still mildly symptomatic as mentioned above. Hgb today is stable at 10.7.  We will see what her iron studies show and bring her back in for infusion if needed.  We will go ahead and plan to see her back in another 6 weeks for follow-up, port flush and lab.  She will contact our office with any questions or concerns. We can certainly see her sooner if need be.   Laverna Peace, NP 8/21/20191:44 PM

## 2017-11-07 LAB — IRON AND TIBC
Iron: 69 ug/dL (ref 41–142)
Saturation Ratios: 36 % (ref 21–57)
TIBC: 189 ug/dL — ABNORMAL LOW (ref 236–444)
UIBC: 120 ug/dL

## 2017-11-07 LAB — FERRITIN: Ferritin: 1379 ng/mL — ABNORMAL HIGH (ref 11–307)

## 2017-11-08 DIAGNOSIS — R350 Frequency of micturition: Secondary | ICD-10-CM | POA: Diagnosis not present

## 2017-11-08 DIAGNOSIS — N3941 Urge incontinence: Secondary | ICD-10-CM | POA: Diagnosis not present

## 2017-11-10 DIAGNOSIS — I4891 Unspecified atrial fibrillation: Secondary | ICD-10-CM | POA: Diagnosis not present

## 2017-11-10 DIAGNOSIS — J449 Chronic obstructive pulmonary disease, unspecified: Secondary | ICD-10-CM | POA: Diagnosis not present

## 2017-11-10 DIAGNOSIS — R05 Cough: Secondary | ICD-10-CM | POA: Diagnosis not present

## 2017-11-13 ENCOUNTER — Encounter: Payer: Self-pay | Admitting: Family

## 2017-11-15 ENCOUNTER — Ambulatory Visit: Payer: PPO | Admitting: Pulmonary Disease

## 2017-11-15 ENCOUNTER — Encounter: Payer: Self-pay | Admitting: Pulmonary Disease

## 2017-11-15 VITALS — BP 138/78 | HR 74 | Ht 60.0 in | Wt 89.0 lb

## 2017-11-15 DIAGNOSIS — J432 Centrilobular emphysema: Secondary | ICD-10-CM | POA: Diagnosis not present

## 2017-11-15 DIAGNOSIS — J9611 Chronic respiratory failure with hypoxia: Secondary | ICD-10-CM

## 2017-11-15 DIAGNOSIS — J189 Pneumonia, unspecified organism: Secondary | ICD-10-CM

## 2017-11-15 DIAGNOSIS — J479 Bronchiectasis, uncomplicated: Secondary | ICD-10-CM | POA: Diagnosis not present

## 2017-11-15 DIAGNOSIS — Z23 Encounter for immunization: Secondary | ICD-10-CM | POA: Diagnosis not present

## 2017-11-15 NOTE — Patient Instructions (Signed)
High dose flu shot today  Follow up in 6 months 

## 2017-11-15 NOTE — Progress Notes (Signed)
[facility]  Chief Complaint  Patient presents with  . Follow-up    56mo rov-pt reports of occ non prod cough & occ wheezing     Constitutional: BP 138/78 (BP Location: Left Arm, Cuff Size: Normal)   Pulse 74   Ht 5' (1.524 m)   Wt 89 lb (40.4 kg)   SpO2 96%   BMI 17.38 kg/m   History of Present Illness: [name] is a 72 y.o. female former smoker with COPD/emphysema, chronic hypoxic respiratory failure, and recurrent pneumonia with focal bronchiectasis.  She was treated for pneumonia in May.  She has improved since then.  Has occasional cough with clear sputum.  Not having fever, chest pain, hemoptysis, sweats, or swelling.  Using 3 liters oxygen.  Comprehensive Respiratory Exam:  Appearance - thin, wearing oxygen ENMT - nasal mucosa moist, turbinates clear, midline nasal septum, no dental lesions, no gingival bleeding, no oral exudates, no tonsillar hypertrophy Neck - no masses, trachea midline, no thyromegaly, no elevation in JVP Respiratory - normal appearance of chest wall, normal respiratory effort w/o accessory muscle use, no dullness on percussion, no wheezing or rales CV - s1s2 regular rate and rhythm, no murmurs, no peripheral edema, radial pulses symmetric GI - soft, non tender Lymph - no adenopathy noted in neck and axillary areas MSK - normal muscle strength and tone, normal gait Ext - no cyanosis, clubbing, or joint inflammation noted Skin - no rashes, lesions, or ulcers Neuro - oriented to person, place, and time Psych - normal mood and affect   Discussion: She had recurrent episode of pneumonia in May 2019 in setting of reflux with aspiration.  She has improved and is back to baseline.  Assessment/Plan:  Bronchiectasis. - prn flutter valve, mucinex - high dose flu shot today  COPD with emphysema. - continue spiriva  Chronic respiratory failure with hypoxia. - 3 liters oxygen 24/7  Plaque psoriasis. -  f/u with Dr. Renda Rolls   Patient Instructions  High dose flu shot today  Follow up in 6 months    Chesley Mires, MD [place] 11/15/2017, 1:39 PM Pager:  [phone]  Flow Sheet  Pulmonary tests: PFT 06/28/08 >> FEV1 1.95 (97%), FEV1% 70, TLC 3.88 (87%), DLCO 64% ONO 02/11/10 >> Test time 9 hrs 9 min, Mean SpO2 87%, low SpO2 80%, Spent 5hrs 57 min (65%) with SpO2 < 88%. PFT 10/10/11 >> FEV1 1.15 (59%), FEV1% 64, TLC 3.26 (73%), DLCO 35%, + BD CT chest 04/30/2011 >> Chronic multifocal nodular and patchy airspace densities throughout the right lung with mild BTX CT chest 03/28/17 >> mild/mod cylindrical BTX diffuse and b/l, tree in bud, nodular infiltrates  Cardiac tests Echo 05/18/14 >> EF 60 to 84%, grade 1 diastolic dysfx, PAS 30 mmHg  Past Medical History: She  has a past medical history of Anemia, Arthritis, Atrial fibrillation ([place]), CAD (coronary artery disease), CHF (congestive heart failure) ([place]), COPD (chronic obstructive pulmonary disease) ([place]), Diverticulitis, Elbow fracture, left (aug 2012), Fibromyalgia, Gastroparesis, GERD (gastroesophageal reflux disease), H/O: GI bleed, Hepatitis, HTN (hypertension), MRSA infection, gastric ulcer, stress fx (aug 2011), Iron malabsorption (08/14/2017), LBBB (left bundle branch block), Oxygen dependent, Pacemaker, Pleural effusion, Pneumonia (- 03/09, 12/09, 02/10, 06/10), Primary dilated cardiomyopathy ([place]), RLS (restless legs syndrome), Silent aspiration, Small bowel obstruction ([place]), Urinary incontinence, Venous embolism and thrombosis of subclavian vein ([place]), and Vitamin B12 deficiency.  Past Surgical History: She  has a past surgical history that includes Appendectomy; pacemaker placement (12/21/2009);  Peg removed; Tonsillectomy (age 19); Interstim implant revision (03/06/2011); Portacath placement (12/16/2009); Total abdominal hysterectomy; Interstim Implant placement (05/28/2006 - stage I); Interstim implant revision  (10/23/2007); Cystoscopy with injection (04/30/2006); Small intestine surgery (05/20/2001); Cardiac catheterization (04/08/2006, 11/16/2009); Cystoscopy (11/11/2008); Cystoscopy w/ retrogrades (11/11/2008); Colectomy; Colectomy (02/04/2000); Gastrocutaneous fistula closure; Exploratory laparotomy (04/27/2009); Port-a-cath removal (07/27/2011); Total hip arthroplasty (08/03/2011); Interstim Implant placement (02/05/2012); Esophagogastroduodenoscopy (N/A, 02/02/2013); Savory dilation (N/A, 02/02/2013); ERCP (N/A, 06/23/2017); ERCP (N/A, 06/24/2017); Cholecystectomy (N/A, 06/27/2017); and IR IMAGING GUIDED PORT INSERTION (10/14/2017).  Family History: Her family history includes Breast cancer in her maternal aunt; Diabetes in her paternal grandfather; Heart disease in her brother, father, mother, and paternal aunt; Stroke in her mother.  Social History: She  reports that she quit smoking about 31 years ago. She has a 20.00 pack-year smoking history. She has never used smokeless tobacco. She reports that she does not drink alcohol or use drugs.  Medications: Allergies as of 11/15/2017      Reactions   Dabigatran Etexilate Mesylate Other (See Comments)   INTERNAL BLEEDING-pradaxa   Talwin [pentazocine] Other (See Comments)   hallucinations   Lactate Rash      Medication List        Accurate as of 11/15/17  1:39 PM. Always use your most recent med list.          acetaminophen 325 MG tablet Commonly known as:  TYLENOL Take 2 tablets (650 mg total) by mouth every 4 (four) hours as needed.   amLODipine 2.5 MG tablet Commonly known as:  NORVASC Take 1 tablet (2.5 mg total) by mouth daily.   apixaban 5 MG Tabs tablet Commonly known as:  ELIQUIS Take 1 tablet (5 mg total) by mouth 2 (two) times daily.   atorvastatin 20 MG tablet Commonly known as:  LIPITOR Take 1 tablet (20 mg total) by mouth daily at 6 PM.   carvedilol 25 MG tablet Commonly known as:  COREG TAKE 1 TABLET BY MOUTH TWICE DAILY     denosumab 60 MG/ML Sosy injection Commonly known as:  PROLIA Inject 60 mg into the skin every 6 (six) months.   furosemide 40 MG tablet Commonly known as:  LASIX TAKE ONE TABLET (40mg ) BY MOUTH IN THE MORNING   gabapentin 600 MG tablet Commonly known as:  NEURONTIN Take 1 tablet (600 mg total) by mouth 3 (three) times daily.   lidocaine-prilocaine cream Commonly known as:  EMLA Apply 1 application topically as needed.   nitroGLYCERIN 0.4 MG SL tablet Commonly known as:  NITROSTAT Place 1 tablet (0.4 mg total) under the tongue every 5 (five) minutes as needed. CHEST PAIN   oxyCODONE-acetaminophen 10-325 MG tablet Commonly known as:  PERCOCET Take 1 tablet by mouth every 4 (four) hours as needed. PAIN   pantoprazole 40 MG tablet Commonly known as:  PROTONIX Take 1 tablet (40 mg total) by mouth 2 (two) times daily.   promethazine 25 MG tablet Commonly known as:  PHENERGAN TAKE ONE TABLET BY MOUTH EVERY 8 HOURS AS NEEDED FOR NAUSEA   SPIRIVA RESPIMAT 1.25 MCG/ACT Aers Generic drug:  Tiotropium Bromide Monohydrate INHALE 2 PUFFS INTO THE LUNGS DAILY   tiZANidine 4 MG tablet Commonly known as:  ZANAFLEX Take 1 tablet (4 mg total) by mouth 3 (three) times daily as needed. for muscle spams   zolpidem 5 MG tablet Commonly known as:  AMBIEN 1 po qhs prn

## 2017-11-19 ENCOUNTER — Other Ambulatory Visit: Payer: Self-pay | Admitting: Family Medicine

## 2017-11-19 DIAGNOSIS — R52 Pain, unspecified: Secondary | ICD-10-CM

## 2017-11-19 MED ORDER — OXYCODONE-ACETAMINOPHEN 10-325 MG PO TABS: 1.0000 | ORAL_TABLET | ORAL | 0 refills | Status: DC | PRN

## 2017-11-19 NOTE — Telephone Encounter (Signed)
Copied from [place] [id number]. Topic: Quick Communication - Rx Refill/Question >> Nov 19, 2017  8:09 AM Yvette Rack wrote: Medication: oxyCODONE-acetaminophen (PERCOCET) 10-325 MG tablet  Has the patient contacted their pharmacy? Yes.   (Agent: If no, request that the patient contact the pharmacy for the refill.) (Agent: If yes, when and what did the pharmacy advise?) pt called last night told her to call provider   Preferred Pharmacy (with phone number or street name):   [place], [place] [phone] (Phone) [phone] (Fax)    Agent: Please be advised that RX refills may take up to 3 business days. We ask that you follow-up with your pharmacy.

## 2017-11-19 NOTE — Telephone Encounter (Signed)
Refill of Percocet  LOV 09/05/17 Dr. Carollee Herter  [facility] 10/16/17  #[phone]  0 refills  [place], [place]        [phone] (Phone) [phone] (Fax)

## 2017-11-19 NOTE — Telephone Encounter (Addendum)
Refill Request: Percocet  Last RX:10/16/17 Last OV:10/29/17 Next OV:12/06/17 UDS:03/07/17 CSC:03/07/17 CSR:

## 2017-11-22 ENCOUNTER — Telehealth: Payer: Self-pay | Admitting: Family Medicine

## 2017-11-22 NOTE — Telephone Encounter (Signed)
Copied from [place] [id number]. Topic: Quick Communication - See Telephone Encounter >> Nov 22, 2017 10:58 AM Mylinda Latina, NT wrote: CRM for notification. See Telephone encounter for: 11/22/17. Neoma Laming calling from [facility] states they need a verbal stating it is ok to fill the medication apixaban (ELIQUIS) 5 MG TABS tablet in reference to a PA. CB# [phone] reference # [id number]

## 2017-11-22 NOTE — Telephone Encounter (Signed)
PA form received from [facility]- form completed and faxed to [phone]. Awaiting determination.

## 2017-11-25 NOTE — Telephone Encounter (Signed)
She has been on this for a while---  What lower cost alternative?   She needs it for a fib

## 2017-11-25 NOTE — Telephone Encounter (Signed)
Eliquis Was denied because it has to have a lower costing alternative. How would you like [facility] to proceed. Please advise Cb#[phone] prompt 0 , 3 , 3  reference # [id number]

## 2017-11-26 ENCOUNTER — Encounter: Payer: Self-pay | Admitting: Family Medicine

## 2017-11-26 NOTE — Telephone Encounter (Signed)
Spoke with insurance [phone]), they stated that the request was for tier reduction.  The patient is in the gap and her copay is $373.96.  Alternatives with cheaper copay while in gap is Jantoven 5mg  and warfarin.

## 2017-11-26 NOTE — Telephone Encounter (Signed)
[name] is warfarin so we need tier reduction

## 2017-11-27 NOTE — Telephone Encounter (Signed)
See my chart message

## 2017-12-06 ENCOUNTER — Ambulatory Visit: Payer: PPO | Admitting: Family Medicine

## 2017-12-09 ENCOUNTER — Ambulatory Visit (INDEPENDENT_AMBULATORY_CARE_PROVIDER_SITE_OTHER): Payer: PPO | Admitting: Family Medicine

## 2017-12-09 ENCOUNTER — Encounter: Payer: Self-pay | Admitting: Family Medicine

## 2017-12-09 VITALS — BP 142/78 | HR 73 | Temp 97.9°F | Resp 16 | Ht 60.0 in | Wt 90.0 lb

## 2017-12-09 DIAGNOSIS — R52 Pain, unspecified: Secondary | ICD-10-CM | POA: Diagnosis not present

## 2017-12-09 DIAGNOSIS — E559 Vitamin D deficiency, unspecified: Secondary | ICD-10-CM

## 2017-12-09 DIAGNOSIS — I10 Essential (primary) hypertension: Secondary | ICD-10-CM

## 2017-12-09 DIAGNOSIS — M5136 Other intervertebral disc degeneration, lumbar region: Secondary | ICD-10-CM

## 2017-12-09 DIAGNOSIS — E538 Deficiency of other specified B group vitamins: Secondary | ICD-10-CM

## 2017-12-09 DIAGNOSIS — D500 Iron deficiency anemia secondary to blood loss (chronic): Secondary | ICD-10-CM

## 2017-12-09 DIAGNOSIS — G4700 Insomnia, unspecified: Secondary | ICD-10-CM | POA: Diagnosis not present

## 2017-12-09 DIAGNOSIS — R8290 Unspecified abnormal findings in urine: Secondary | ICD-10-CM

## 2017-12-09 DIAGNOSIS — E440 Moderate protein-calorie malnutrition: Secondary | ICD-10-CM

## 2017-12-09 DIAGNOSIS — E785 Hyperlipidemia, unspecified: Secondary | ICD-10-CM | POA: Diagnosis not present

## 2017-12-09 LAB — POC URINALSYSI DIPSTICK (AUTOMATED)
Bilirubin, UA: NEGATIVE
Blood, UA: NEGATIVE
Glucose, UA: NEGATIVE
Ketones, UA: NEGATIVE
Leukocytes, UA: NEGATIVE
Nitrite, UA: NEGATIVE
Protein, UA: NEGATIVE
Spec Grav, UA: 1.01 (ref 1.010–1.025)
Urobilinogen, UA: 0.2 E.U./dL
pH, UA: 6.5 (ref 5.0–8.0)

## 2017-12-09 MED ORDER — ATORVASTATIN CALCIUM 20 MG PO TABS: 20.0000 mg | ORAL_TABLET | Freq: Every day | ORAL | 1 refills | Status: DC

## 2017-12-09 MED ORDER — TIZANIDINE HCL 4 MG PO TABS: 4.0000 mg | ORAL_TABLET | Freq: Three times a day (TID) | ORAL | 2 refills | Status: DC | PRN

## 2017-12-09 MED ORDER — ZOLPIDEM TARTRATE 5 MG PO TABS: ORAL_TABLET | ORAL | 2 refills | Status: DC

## 2017-12-09 NOTE — Assessment & Plan Note (Signed)
Stable Pain controlled with controlled sub

## 2017-12-09 NOTE — Assessment & Plan Note (Signed)
Drink more water  Urine dip neg

## 2017-12-09 NOTE — Assessment & Plan Note (Signed)
Per hematology 

## 2017-12-09 NOTE — Assessment & Plan Note (Signed)
Well controlled, no changes to meds. Encouraged heart healthy diet such as the DASH diet and exercise as tolerated.  °

## 2017-12-09 NOTE — Patient Instructions (Signed)

## 2017-12-09 NOTE — Assessment & Plan Note (Signed)
Tolerating statin, encouraged heart healthy diet, avoid trans fats, minimize simple carbs and saturated fats. Increase exercise as tolerated 

## 2017-12-09 NOTE — Assessment & Plan Note (Signed)
stable °

## 2017-12-09 NOTE — Progress Notes (Signed)
Patient ID: RILEYANN FLORANCE, female    DOB: May 22, 1945  Age: 72 y.o. MRN: [id number]    Subjective:  Subjective  HPI SHOSHANNA MCQUITTY presents for f/u. She needs med refills and c/o dark urine.  No other new problems.    Review of Systems  Constitutional: Negative for chills and fever.  HENT: Negative for congestion and hearing loss.   Eyes: Negative for discharge.  Respiratory: Negative for cough and shortness of breath.   Cardiovascular: Negative for chest pain, palpitations and leg swelling.  Gastrointestinal: Negative for abdominal pain, blood in stool, constipation, diarrhea, nausea and vomiting.  Genitourinary: Positive for frequency. Negative for dysuria, hematuria and urgency.  Musculoskeletal: Negative for back pain and myalgias.  Skin: Negative for rash.  Allergic/Immunologic: Negative for environmental allergies.  Neurological: Negative for dizziness, weakness and headaches.  Hematological: Does not bruise/bleed easily.  Psychiatric/Behavioral: Negative for suicidal ideas. The patient is not nervous/anxious.     History Past Medical History:  Diagnosis Date  . Anemia   . Arthritis    right hip  . Atrial fibrillation ([place])   . CAD (coronary artery disease)    mild disease per cath in 2011  . CHF (congestive heart failure) ([place])   . COPD (chronic obstructive pulmonary disease) ([facility])    continuous O2- 2l  . Diverticulitis   . Elbow fracture, left aug 2012  . Fibromyalgia    COUPLE OF TIMES A YEAR-PT HAS EPISODES OF CONFUSION-USUALLY INVOLVES DAY/NIGHT REVERSAL AND EPISODES OF TWITCHING AND FALLS--SEES DR. Jacelyn Grip - NEUROLOGIST-LAST SEEN 07/10/11  . Gastroparesis   . GERD (gastroesophageal reflux disease)   . H/O: GI bleed    from Pradaxa  . Hepatitis    hx of in high school   . HTN (hypertension)    under control; has been on med. x "years"  . Hx MRSA infection   . Hx of gastric ulcer   . Hx of stress fx aug 2011   right hip   . Iron malabsorption 08/14/2017  .  LBBB (left bundle branch block)   . Oxygen dependent    2 liters via nasal cannula at all times  . Pacemaker    CRT therapy; followed by Dr. Caryl Comes  . Pleural effusion     s/p right thoracentesis 03/09  . Pneumonia - 03/09, 12/09, 02/10, 06/10   MOST RECENT FEB 2013  . Primary dilated cardiomyopathy ([facility])    EF 45 to 50% per echo in Jan 2012  . RLS (restless legs syndrome)   . Silent aspiration   . Small bowel obstruction ([place])   . Urinary incontinence     -INTERSTIM IMPLANT NOT FUNCTIONING PER PT  . Venous embolism and thrombosis of subclavian vein ([place])    after pacemaker insertion Oct 2011  . Vitamin B12 deficiency     She has a past surgical history that includes Appendectomy; pacemaker placement (12/21/2009); Peg removed; Tonsillectomy (age 57); Interstim implant revision (03/06/2011); Portacath placement (12/16/2009); Total abdominal hysterectomy; Interstim Implant placement (05/28/2006 - stage I); Interstim implant revision (10/23/2007); Cystoscopy with injection (04/30/2006); Small intestine surgery (05/20/2001); Cardiac catheterization (04/08/2006, 11/16/2009); Cystoscopy (11/11/2008); Cystoscopy w/ retrogrades (11/11/2008); Colectomy; Colectomy (02/04/2000); Gastrocutaneous fistula closure; Exploratory laparotomy (04/27/2009); Port-a-cath removal (07/27/2011); Total hip arthroplasty (08/03/2011); Interstim Implant placement (02/05/2012); Esophagogastroduodenoscopy (N/A, 02/02/2013); Savory dilation (N/A, 02/02/2013); ERCP (N/A, 06/23/2017); ERCP (N/A, 06/24/2017); Cholecystectomy (N/A, 06/27/2017); and IR IMAGING GUIDED PORT INSERTION (10/14/2017).   Her family history includes Breast cancer in her maternal aunt; Diabetes in her  paternal grandfather; Heart disease in her brother, father, mother, and paternal aunt; Stroke in her mother.She reports that she quit smoking about 31 years ago. She has a 20.00 pack-year smoking history. She has never used smokeless tobacco. She reports that she does not drink  alcohol or use drugs.  Current Outpatient Medications on File Prior to Visit  Medication Sig Dispense Refill  . acetaminophen (TYLENOL) 325 MG tablet Take 2 tablets (650 mg total) by mouth every 4 (four) hours as needed.    [name] amLODipine (NORVASC) 2.5 MG tablet Take 1 tablet (2.5 mg total) by mouth daily. 90 tablet 3  . apixaban (ELIQUIS) 5 MG TABS tablet Take 1 tablet (5 mg total) by mouth 2 (two) times daily. 180 tablet 1  . carvedilol (COREG) 25 MG tablet TAKE 1 TABLET BY MOUTH TWICE DAILY 180 tablet 1  . denosumab (PROLIA) 60 MG/ML SOSY injection Inject 60 mg into the skin every 6 (six) months.    . furosemide (LASIX) 40 MG tablet TAKE ONE TABLET (42m) BY MOUTH IN THE MORNING 90 tablet 3  . gabapentin (NEURONTIN) 600 MG tablet Take 1 tablet (600 mg total) by mouth 3 (three) times daily.    .Marland Kitchenlidocaine-prilocaine (EMLA) cream Apply 1 application topically as needed. 30 g 6  . nitroGLYCERIN (NITROSTAT) 0.4 MG SL tablet Place 1 tablet (0.4 mg total) under the tongue every 5 (five) minutes as needed. CHEST PAIN 25 tablet 5  . oxyCODONE-acetaminophen (PERCOCET) 10-325 MG tablet Take 1 tablet by mouth every 4 (four) hours as needed. PAIN 120 tablet 0  . pantoprazole (PROTONIX) 40 MG tablet Take 1 tablet (40 mg total) by mouth 2 (two) times daily. 60 tablet 0  . promethazine (PHENERGAN) 25 MG tablet TAKE ONE TABLET BY MOUTH EVERY 8 HOURS AS NEEDED FOR NAUSEA 40 tablet 2  . SPIRIVA RESPIMAT 1.25 MCG/ACT AERS INHALE 2 PUFFS INTO THE LUNGS DAILY 1 Inhaler 5  . [DISCONTINUED] iron dextran complex (INFED) 50 MG/ML injection Please give infed infusion (no test dose needed) over 4 hours per pharmacy calculated dose. Ht: 5'2 Wt: 99 pounds Hgb: 12 1 mL 0   No current facility-administered medications on file prior to visit.      Objective:  Objective  Physical Exam  Constitutional: She is oriented to person, place, and time. She appears well-developed and well-nourished.  HENT:  Head: Normocephalic  and atraumatic.  Eyes: Conjunctivae and EOM are normal.  Neck: Normal range of motion. Neck supple. No JVD present. Carotid bruit is not present. No thyromegaly present.  Cardiovascular: Normal rate, regular rhythm and normal heart sounds.  No murmur heard. Pulmonary/Chest: Effort normal. No respiratory distress. She has decreased breath sounds. She has no wheezes. She has no rales. She exhibits no tenderness.  Musculoskeletal: She exhibits no edema.  Neurological: She is alert and oriented to person, place, and time.  Psychiatric: She has a normal mood and affect.  Nursing note and vitals reviewed.  BP (!) 142/78 (BP Location: Right Arm, Cuff Size: Normal)   Pulse 73   Temp 97.9 F (36.6 C) (Oral)   Resp 16   Ht 5' (1.524 m)   Wt 90 lb (40.8 kg)   SpO2 96% Comment: on 3 liters of oxygen  BMI 17.58 kg/m  Wt Readings from Last 3 Encounters:  12/09/17 90 lb (40.8 kg)  11/15/17 89 lb (40.4 kg)  11/06/17 90 lb (40.8 kg)     Lab Results  Component Value Date   WBC 6.6  11/06/2017   HGB 10.7 (L) 11/06/2017   HCT 33.9 (L) 11/06/2017   PLT 239 11/06/2017   GLUCOSE 107 (H) 11/06/2017   CHOL 140 05/31/2017   TRIG 67 05/31/2017   HDL 43 05/31/2017   LDLCALC 84 05/31/2017   ALT 11 11/06/2017   AST 20 11/06/2017   NA 140 11/06/2017   K 4.5 11/06/2017   CL 103 11/06/2017   CREATININE 0.72 11/06/2017   BUN 11 11/06/2017   CO2 30 11/06/2017   TSH 4.43 06/26/2010   INR 0.98 10/14/2017   HGBA1C 5.2 05/31/2017    Ir Imaging Guided Port Insertion  Result Date: 10/14/2017 INDICATION: Poor venous access. In need of durable intravenous access for frequent blood draws and medication administration. EXAM: IMPLANTED PORT A CATH PLACEMENT WITH ULTRASOUND AND FLUOROSCOPIC GUIDANCE COMPARISON:  Chest CT - 03/28/2017 MEDICATIONS: Ancef 2 gm IV; The antibiotic was administered within an appropriate time interval prior to skin puncture. ANESTHESIA/SEDATION: Moderate (conscious) sedation was  employed during this procedure. A total of Versed 3 mg and Fentanyl 200 mcg was administered intravenously. Moderate Sedation Time: 27 minutes. The patient's level of consciousness and vital signs were monitored continuously by radiology nursing throughout the procedure under my direct supervision. CONTRAST:  None FLUOROSCOPY TIME:  48 seconds (6 mGy) COMPLICATIONS: None immediate. PROCEDURE: The procedure, risks, benefits, and alternatives were explained to the patient. Questions regarding the procedure were encouraged and answered. The patient understands and consents to the procedure. The right neck and chest were prepped with chlorhexidine in a sterile fashion, and a sterile drape was applied covering the operative field. Maximum barrier sterile technique with sterile gowns and gloves were used for the procedure. A timeout was performed prior to the initiation of the procedure. Local anesthesia was provided with 1% lidocaine with epinephrine. After creating a small venotomy incision, a micropuncture kit was utilized to access the internal jugular vein. Real-time ultrasound guidance was utilized for vascular access including the acquisition of a permanent ultrasound image documenting patency of the accessed vessel. The microwire was utilized to measure appropriate catheter length. A subcutaneous port pocket was then created along the upper chest wall utilizing a combination of sharp and blunt dissection. The pocket was irrigated with sterile saline. A single lumen thin power injectable port was chosen for placement. The 8 Fr catheter was tunneled from the port pocket site to the venotomy incision. The port was placed in the pocket. The external catheter was trimmed to appropriate length. At the venotomy, an 8 Fr peel-away sheath was placed over a guidewire under fluoroscopic guidance. The catheter was then placed through the sheath and the sheath was removed. Final catheter positioning was confirmed and  documented with a fluoroscopic spot radiograph. The port was accessed with a Huber needle, aspirated and flushed with heparinized saline. The venotomy site was closed with an interrupted 4-0 Vicryl suture. The port pocket incision was closed with interrupted 2-0 Vicryl suture and the skin was opposed with a running subcuticular 4-0 Vicryl suture. Dermabond and Steri-strips were applied to both incisions. Dressings were placed. The patient tolerated the procedure well without immediate post procedural complication. FINDINGS: After catheter placement, the tip lies within the superior cavoatrial junction. The catheter aspirates and flushes normally and is ready for immediate use. IMPRESSION: Successful placement of a right internal jugular approach power injectable Port-A-Cath. The catheter is ready for immediate use. Electronically Signed   By: Sandi Mariscal M.D.   On: 10/14/2017 17:19     Assessment &  Plan:  Plan  I am having [name] maintain her nitroGLYCERIN, gabapentin, pantoprazole, acetaminophen, SPIRIVA RESPIMAT, apixaban, furosemide, carvedilol, amLODipine, promethazine, denosumab, lidocaine-prilocaine, oxyCODONE-acetaminophen, tiZANidine, zolpidem, and atorvastatin.  Meds ordered this encounter  Medications  . tiZANidine (ZANAFLEX) 4 MG tablet    Sig: Take 1 tablet (4 mg total) by mouth 3 (three) times daily as needed. for muscle spams    Dispense:  90 tablet    Refill:  2    Please consider 90 day supplies to promote better adherence  . DISCONTD: atorvastatin (LIPITOR) 20 MG tablet    Sig: Take 1 tablet (20 mg total) by mouth daily at 6 PM.    Dispense:  90 tablet    Refill:  1  . zolpidem (AMBIEN) 5 MG tablet    Sig: 1 po qhs prn    Dispense:  30 tablet    Refill:  2  . atorvastatin (LIPITOR) 20 MG tablet    Sig: Take 1 tablet (20 mg total) by mouth daily at 6 PM.    Dispense:  90 tablet    Refill:  1    Problem List Items Addressed This Visit      Unprioritized   Bad  odor of urine - Primary    Drink more water  Urine dip neg      Relevant Orders   POCT Urinalysis Dipstick (Automated) (Completed)   Degenerative disc disease, lumbar    Stable Pain controlled with controlled sub      Relevant Medications   tiZANidine (ZANAFLEX) 4 MG tablet   Essential hypertension    Well controlled, no changes to meds. Encouraged heart healthy diet such as the DASH diet and exercise as tolerated.       Relevant Medications   atorvastatin (LIPITOR) 20 MG tablet   Hyperlipidemia LDL goal <100    Tolerating statin, encouraged heart healthy diet, avoid trans fats, minimize simple carbs and saturated fats. Increase exercise as tolerated      Relevant Medications   atorvastatin (LIPITOR) 20 MG tablet   Other Relevant Orders   Lipid panel   Comprehensive metabolic panel   Insomnia   Relevant Medications   zolpidem (AMBIEN) 5 MG tablet   Iron deficiency anemia due to chronic blood loss    Per hematology      Protein-calorie malnutrition, moderate ([facility])    stable      Vitamin D deficiency (Chronic)   Relevant Orders   Vitamin D (25 hydroxy)    Other Visit Diagnoses    Pain       Relevant Medications   tiZANidine (ZANAFLEX) 4 MG tablet   Vitamin B12 deficiency       Relevant Orders   Vitamin B12      Follow-up: Return in about 6 months (around 06/09/2018), or if symptoms worsen or fail to improve, for annual exam, fasting.  Ann Held, DO

## 2017-12-10 NOTE — Telephone Encounter (Signed)
Patient seen in office

## 2017-12-11 DIAGNOSIS — R05 Cough: Secondary | ICD-10-CM | POA: Diagnosis not present

## 2017-12-11 DIAGNOSIS — I4891 Unspecified atrial fibrillation: Secondary | ICD-10-CM | POA: Diagnosis not present

## 2017-12-11 DIAGNOSIS — J449 Chronic obstructive pulmonary disease, unspecified: Secondary | ICD-10-CM | POA: Diagnosis not present

## 2017-12-18 ENCOUNTER — Inpatient Hospital Stay: Payer: PPO

## 2017-12-18 ENCOUNTER — Other Ambulatory Visit: Payer: Self-pay

## 2017-12-18 ENCOUNTER — Encounter: Payer: Self-pay | Admitting: Family Medicine

## 2017-12-18 ENCOUNTER — Encounter: Payer: Self-pay | Admitting: Family

## 2017-12-18 ENCOUNTER — Inpatient Hospital Stay: Payer: PPO | Attending: Family | Admitting: Family

## 2017-12-18 VITALS — BP 162/69 | HR 71 | Temp 98.5°F | Resp 17 | Wt 90.0 lb

## 2017-12-18 DIAGNOSIS — Z7901 Long term (current) use of anticoagulants: Secondary | ICD-10-CM | POA: Diagnosis not present

## 2017-12-18 DIAGNOSIS — K909 Intestinal malabsorption, unspecified: Secondary | ICD-10-CM | POA: Insufficient documentation

## 2017-12-18 DIAGNOSIS — M797 Fibromyalgia: Secondary | ICD-10-CM | POA: Insufficient documentation

## 2017-12-18 DIAGNOSIS — D508 Other iron deficiency anemias: Secondary | ICD-10-CM

## 2017-12-18 DIAGNOSIS — R0602 Shortness of breath: Secondary | ICD-10-CM

## 2017-12-18 DIAGNOSIS — Z79899 Other long term (current) drug therapy: Secondary | ICD-10-CM | POA: Insufficient documentation

## 2017-12-18 DIAGNOSIS — Z9981 Dependence on supplemental oxygen: Secondary | ICD-10-CM | POA: Diagnosis not present

## 2017-12-18 DIAGNOSIS — E785 Hyperlipidemia, unspecified: Secondary | ICD-10-CM | POA: Diagnosis not present

## 2017-12-18 DIAGNOSIS — E559 Vitamin D deficiency, unspecified: Secondary | ICD-10-CM | POA: Diagnosis not present

## 2017-12-18 DIAGNOSIS — E538 Deficiency of other specified B group vitamins: Secondary | ICD-10-CM | POA: Diagnosis not present

## 2017-12-18 DIAGNOSIS — D500 Iron deficiency anemia secondary to blood loss (chronic): Secondary | ICD-10-CM

## 2017-12-18 LAB — CMP (CANCER CENTER ONLY)
ALT: 16 U/L (ref 10–47)
AST: 23 U/L (ref 11–38)
Albumin: 3.7 g/dL (ref 3.5–5.0)
Alkaline Phosphatase: 108 U/L — ABNORMAL HIGH (ref 26–84)
Anion gap: 2 — ABNORMAL LOW (ref 5–15)
BUN: 16 mg/dL (ref 7–22)
CO2: 30 mmol/L (ref 18–33)
Calcium: 8.7 mg/dL (ref 8.0–10.3)
Chloride: 106 mmol/L (ref 98–108)
Creatinine: 0.7 mg/dL (ref 0.60–1.20)
Glucose, Bld: 100 mg/dL (ref 73–118)
Potassium: 4.1 mmol/L (ref 3.3–4.7)
Sodium: 138 mmol/L (ref 128–145)
Total Bilirubin: 0.5 mg/dL (ref 0.2–1.6)
Total Protein: 7 g/dL (ref 6.4–8.1)

## 2017-12-18 LAB — CBC WITH DIFFERENTIAL (CANCER CENTER ONLY)
Basophils Absolute: 0 10*3/uL (ref 0.0–0.1)
Basophils Relative: 0 %
Eosinophils Absolute: 0.2 10*3/uL (ref 0.0–0.5)
Eosinophils Relative: 3 %
HCT: 33.7 % — ABNORMAL LOW (ref 34.8–46.6)
Hemoglobin: 10.7 g/dL — ABNORMAL LOW (ref 11.6–15.9)
Lymphocytes Relative: 8 %
Lymphs Abs: 0.5 10*3/uL — ABNORMAL LOW (ref 0.9–3.3)
MCH: 29.8 pg (ref 26.0–34.0)
MCHC: 31.8 g/dL — ABNORMAL LOW (ref 32.0–36.0)
MCV: 93.9 fL (ref 81.0–101.0)
Monocytes Absolute: 0.6 10*3/uL (ref 0.1–0.9)
Monocytes Relative: 8 %
Neutro Abs: 5.6 10*3/uL (ref 1.5–6.5)
Neutrophils Relative %: 81 %
Platelet Count: 232 10*3/uL (ref 145–400)
RBC: 3.59 MIL/uL — ABNORMAL LOW (ref 3.70–5.32)
RDW: 13.4 % (ref 11.1–15.7)
WBC Count: 7 10*3/uL (ref 3.9–10.0)

## 2017-12-18 LAB — RETICULOCYTES
RBC.: 3.6 MIL/uL — ABNORMAL LOW (ref 3.70–5.45)
Retic Count, Absolute: 36 10*3/uL (ref 33.7–90.7)
Retic Ct Pct: 1 % (ref 0.7–2.1)

## 2017-12-18 MED ORDER — SODIUM CHLORIDE 0.9% FLUSH
10.0000 mL | INTRAVENOUS | Status: DC | PRN
  Administered 2017-12-18: 10 mL
  Filled 2017-12-18: qty 10

## 2017-12-18 MED ORDER — HEPARIN SOD (PORK) LOCK FLUSH 100 UNIT/ML IV SOLN
500.0000 [IU] | Freq: Once | INTRAVENOUS | Status: AC | PRN
  Administered 2017-12-18: 500 [IU]
  Filled 2017-12-18: qty 5

## 2017-12-18 NOTE — Progress Notes (Signed)
Hematology and Oncology Follow Up Visit  [name] [id number] 12/06/1945 72 y.o. 12/18/2017   Principle Diagnosis:  Iron deficiency anemia  Current Therapy:   IV iron as indicated - last received in August 2019 x 2   Interim History:  [name] is here today for follow-up. She still has some fatigue at times but states that this seems better since starting IV iron infusions.  Hgb today is 10.7, MCV 93. Iron studies last month were stable.  She has had no episodes of bleeding. Her skin is thin and with her taking Eliquis she does bruise easily.  Her SOB is stable. She is still on 3 L [place] supplemental O2 24 hours a day.  She has occasional episodes of dizziness if she stands too quickly.  She takes Ambien at night to help her sleep as needed. She has trouble sleeping at night due to her fibromyalgia pain.  No fever, chills, n/v, cough, rash, chest pain, palpitations, abdominal pain or changes in bowel or bladder habits.  No swelling, numbness or tingling in her extremities. She has generalized aches and pains due to fibromyalgia.  No lymphadenopathy noted on exam.  She has maintained a good appetite and is staying well hydrated. Her weight is stable.   ECOG Performance Status: 1 - Symptomatic but completely ambulatory  Medications:  Allergies as of 12/18/2017      Reactions   Dabigatran Etexilate Mesylate Other (See Comments)   INTERNAL BLEEDING-pradaxa   Talwin [pentazocine] Other (See Comments)   hallucinations   Lactate Rash      Medication List        Accurate as of 12/18/17  2:11 PM. Always use your most recent med list.          acetaminophen 325 MG tablet Commonly known as:  TYLENOL Take 2 tablets (650 mg total) by mouth every 4 (four) hours as needed.   amLODipine 2.5 MG tablet Commonly known as:  NORVASC Take 1 tablet (2.5 mg total) by mouth daily.   apixaban 5 MG Tabs tablet Commonly known as:  ELIQUIS Take 1 tablet (5 mg total) by mouth 2 (two) times  daily.   atorvastatin 20 MG tablet Commonly known as:  LIPITOR Take 1 tablet (20 mg total) by mouth daily at 6 PM.   carvedilol 25 MG tablet Commonly known as:  COREG TAKE 1 TABLET BY MOUTH TWICE DAILY   denosumab 60 MG/ML Sosy injection Commonly known as:  PROLIA Inject 60 mg into the skin every 6 (six) months.   furosemide 40 MG tablet Commonly known as:  LASIX TAKE ONE TABLET (40mg ) BY MOUTH IN THE MORNING   gabapentin 600 MG tablet Commonly known as:  NEURONTIN Take 1 tablet (600 mg total) by mouth 3 (three) times daily.   lidocaine-prilocaine cream Commonly known as:  EMLA Apply 1 application topically as needed.   nitroGLYCERIN 0.4 MG SL tablet Commonly known as:  NITROSTAT Place 1 tablet (0.4 mg total) under the tongue every 5 (five) minutes as needed. CHEST PAIN   oxyCODONE-acetaminophen 10-325 MG tablet Commonly known as:  PERCOCET Take 1 tablet by mouth every 4 (four) hours as needed. PAIN   pantoprazole 40 MG tablet Commonly known as:  PROTONIX Take 1 tablet (40 mg total) by mouth 2 (two) times daily.   promethazine 25 MG tablet Commonly known as:  PHENERGAN TAKE ONE TABLET BY MOUTH EVERY 8 HOURS AS NEEDED FOR NAUSEA   SPIRIVA RESPIMAT 1.25 MCG/ACT Aers Generic drug:  Tiotropium Bromide Monohydrate INHALE 2 PUFFS INTO THE LUNGS DAILY   tiZANidine 4 MG tablet Commonly known as:  ZANAFLEX Take 1 tablet (4 mg total) by mouth 3 (three) times daily as needed. for muscle spams   zolpidem 5 MG tablet Commonly known as:  AMBIEN 1 po qhs prn       Allergies:  Allergies  Allergen Reactions  . Dabigatran Etexilate Mesylate Other (See Comments)    INTERNAL BLEEDING-pradaxa  . Talwin [Pentazocine] Other (See Comments)    hallucinations  . Lactate Rash    Past Medical History, Surgical history, Social history, and Family History were reviewed and updated.  Review of Systems: All other 10 point review of systems is negative.   Physical Exam:   weight is 90 lb (40.8 kg). Her oral temperature is 98.5 F (36.9 C). Her blood pressure is 162/69 (abnormal) and her pulse is 71. Her respiration is 17 and oxygen saturation is 100%.   Wt Readings from Last 3 Encounters:  12/18/17 90 lb (40.8 kg)  12/09/17 90 lb (40.8 kg)  11/15/17 89 lb (40.4 kg)    Ocular: Sclerae unicteric, pupils equal, round and reactive to light Ear-nose-throat: Oropharynx clear, dentition fair Lymphatic: No cervical, supraclavicular or axillary adenopathy Lungs no rales or rhonchi, good excursion bilaterally Heart regular rate and rhythm, no murmur appreciated Abd soft, nontender, positive bowel sounds, no liver or spleen tip palpated on exam, no fluid wave  MSK no focal spinal tenderness, no joint edema Neuro: non-focal, well-oriented, appropriate affect Breasts: Deferred   Lab Results  Component Value Date   WBC 7.0 12/18/2017   HGB 10.7 (L) 12/18/2017   HCT 33.7 (L) 12/18/2017   MCV 93.9 12/18/2017   PLT 232 12/18/2017   Lab Results  Component Value Date   FERRITIN 1,379 (H) 11/06/2017   IRON 69 11/06/2017   TIBC 189 (L) 11/06/2017   UIBC 120 11/06/2017   IRONPCTSAT 36 11/06/2017   Lab Results  Component Value Date   RETICCTPCT 1.3 11/06/2017   RBC 3.59 (L) 12/18/2017   RETICCTABS 64.6 04/01/2009   No results found for: KPAFRELGTCHN, LAMBDASER, KAPLAMBRATIO No results found for: IGGSERUM, IGA, IGMSERUM No results found for: Ronnald Ramp, A1GS, A2GS, Violet Baldy, MSPIKE, SPEI   Chemistry      Component Value Date/Time   NA 140 11/06/2017 1323   K 4.5 11/06/2017 1323   CL 103 11/06/2017 1323   CO2 30 11/06/2017 1323   BUN 11 11/06/2017 1323   CREATININE 0.72 11/06/2017 1323   CREATININE 0.91 03/16/2016 1539      Component Value Date/Time   CALCIUM 8.5 (L) 11/06/2017 1323   ALKPHOS 111 11/06/2017 1323   AST 20 11/06/2017 1323   ALT 11 11/06/2017 1323   BILITOT <0.2 (L) 11/06/2017 1323      Impression and  Plan: [name] is a very pleasant 72 yo caucasian female with iron deficiency anemia secondary to malabsorption and possible GI blood loss with Eliquis.  She is doing fairly well but still having some fatigue at times.  We will see what her iron studies show and bring her back in for infusion if needed.  We will plan to see her back in another 6 weeks for follow-up, port flush and lab.  She will contact our office with any questions or concerns. We can certainly see her sooner if need be.   Laverna Peace, NP 10/2/20192:11 PM

## 2017-12-18 NOTE — Patient Instructions (Signed)
Implanted Port Home Guide An implanted port is a type of central line that is placed under the skin. Central lines are used to provide IV access when treatment or nutrition needs to be given through a person's veins. Implanted ports are used for long-term IV access. An implanted port may be placed because:  You need IV medicine that would be irritating to the small veins in your hands or arms.  You need long-term IV medicines, such as antibiotics.  You need IV nutrition for a long period.  You need frequent blood draws for lab tests.  You need dialysis.  Implanted ports are usually placed in the chest area, but they can also be placed in the upper arm, the abdomen, or the leg. An implanted port has two main parts:  Reservoir. The reservoir is round and will appear as a small, raised area under your skin. The reservoir is the part where a needle is inserted to give medicines or draw blood.  Catheter. The catheter is a thin, flexible tube that extends from the reservoir. The catheter is placed into a large vein. Medicine that is inserted into the reservoir goes into the catheter and then into the vein.  How will I care for my incision site? Do not get the incision site wet. Bathe or shower as directed by your health care provider. How is my port accessed? Special steps must be taken to access the port:  Before the port is accessed, a numbing cream can be placed on the skin. This helps numb the skin over the port site.  Your health care provider uses a sterile technique to access the port. ? Your health care provider must put on a mask and sterile gloves. ? The skin over your port is cleaned carefully with an antiseptic and allowed to dry. ? The port is gently pinched between sterile gloves, and a needle is inserted into the port.  Only "non-coring" port needles should be used to access the port. Once the port is accessed, a blood return should be checked. This helps ensure that the port  is in the vein and is not clogged.  If your port needs to remain accessed for a constant infusion, a clear (transparent) bandage will be placed over the needle site. The bandage and needle will need to be changed every week, or as directed by your health care provider.  Keep the bandage covering the needle clean and dry. Do not get it wet. Follow your health care provider's instructions on how to take a shower or bath while the port is accessed.  If your port does not need to stay accessed, no bandage is needed over the port.  What is flushing? Flushing helps keep the port from getting clogged. Follow your health care provider's instructions on how and when to flush the port. Ports are usually flushed with saline solution or a medicine called heparin. The need for flushing will depend on how the port is used.  If the port is used for intermittent medicines or blood draws, the port will need to be flushed: ? After medicines have been given. ? After blood has been drawn. ? As part of routine maintenance.  If a constant infusion is running, the port may not need to be flushed.  How long will my port stay implanted? The port can stay in for as long as your health care provider thinks it is needed. When it is time for the port to come out, surgery will be   done to remove it. The procedure is similar to the one performed when the port was put in. When should I seek immediate medical care? When you have an implanted port, you should seek immediate medical care if:  You notice a bad smell coming from the incision site.  You have swelling, redness, or drainage at the incision site.  You have more swelling or pain at the port site or the surrounding area.  You have a fever that is not controlled with medicine.  This information is not intended to replace advice given to you by your health care provider. Make sure you discuss any questions you have with your health care provider. Document  Released: 03/05/2005 Document Revised: 08/11/2015 Document Reviewed: 11/10/2012 Elsevier Interactive Patient Education  2017 [employer]vier Inc.  

## 2017-12-19 ENCOUNTER — Other Ambulatory Visit: Payer: Self-pay | Admitting: Family Medicine

## 2017-12-19 DIAGNOSIS — R52 Pain, unspecified: Secondary | ICD-10-CM

## 2017-12-19 LAB — IRON AND TIBC
Iron: 68 ug/dL (ref 41–142)
Saturation Ratios: 34 % (ref 21–57)
TIBC: 198 ug/dL — ABNORMAL LOW (ref 236–444)
UIBC: 130 ug/dL

## 2017-12-19 LAB — FERRITIN: Ferritin: 1140 ng/mL — ABNORMAL HIGH (ref 11–307)

## 2017-12-19 MED ORDER — OXYCODONE-ACETAMINOPHEN 10-325 MG PO TABS: 1.0000 | ORAL_TABLET | ORAL | 0 refills | Status: DC | PRN

## 2017-12-19 NOTE — Telephone Encounter (Signed)
Requested medication (s) are due for refill today -yes  Requested medication (s) are on the active medication list -yes  Future visit scheduled -yes   Requested Prescriptions  Pending Prescriptions Disp Refills   oxyCODONE-acetaminophen (PERCOCET) 10-325 MG tablet 120 tablet 0    Sig: Take 1 tablet by mouth every 4 (four) hours as needed. PAIN     Not Delegated - Analgesics:  Opioid Agonist Combinations Failed - 12/19/2017  4:11 PM      Failed - This refill cannot be delegated      Passed - Urine Drug Screen completed in last 360 days.      Passed - Valid encounter within last 6 months    Recent Outpatient Visits          1 week ago Bad odor of urine   [facility], DO   3 months ago Anemia, chronic disease   [facility], DO   5 months ago Anemia, unspecified type   [facility], DO   6 months ago TIA (transient ischemic attack)   [facility], DO   6 months ago High risk medication use   [facility], DO      Future Appointments            In 5 months [place], [place] at [facility], [place]

## 2017-12-19 NOTE — Telephone Encounter (Signed)
Copied from [place] [id number]. Topic: Quick Communication - See Telephone Encounter >> Dec 19, 2017  4:03 PM Vernona Rieger wrote: CRM for notification. See Telephone encounter for: 12/19/17.  oxyCODONE-acetaminophen (PERCOCET) 10-325 MG tablet  [place], [place] [address] Phone: [phone] Fax: [phone]

## 2018-01-10 DIAGNOSIS — R05 Cough: Secondary | ICD-10-CM | POA: Diagnosis not present

## 2018-01-10 DIAGNOSIS — I4891 Unspecified atrial fibrillation: Secondary | ICD-10-CM | POA: Diagnosis not present

## 2018-01-10 DIAGNOSIS — J449 Chronic obstructive pulmonary disease, unspecified: Secondary | ICD-10-CM | POA: Diagnosis not present

## 2018-01-17 IMAGING — DX DG CHEST 2V
2 series · 2 of 2 positions shown · non-contrast
Comparison: 04/30/2016

CLINICAL DATA: Followup pneumonia.

EXAM:
CHEST  2 VIEW

[chest pa]
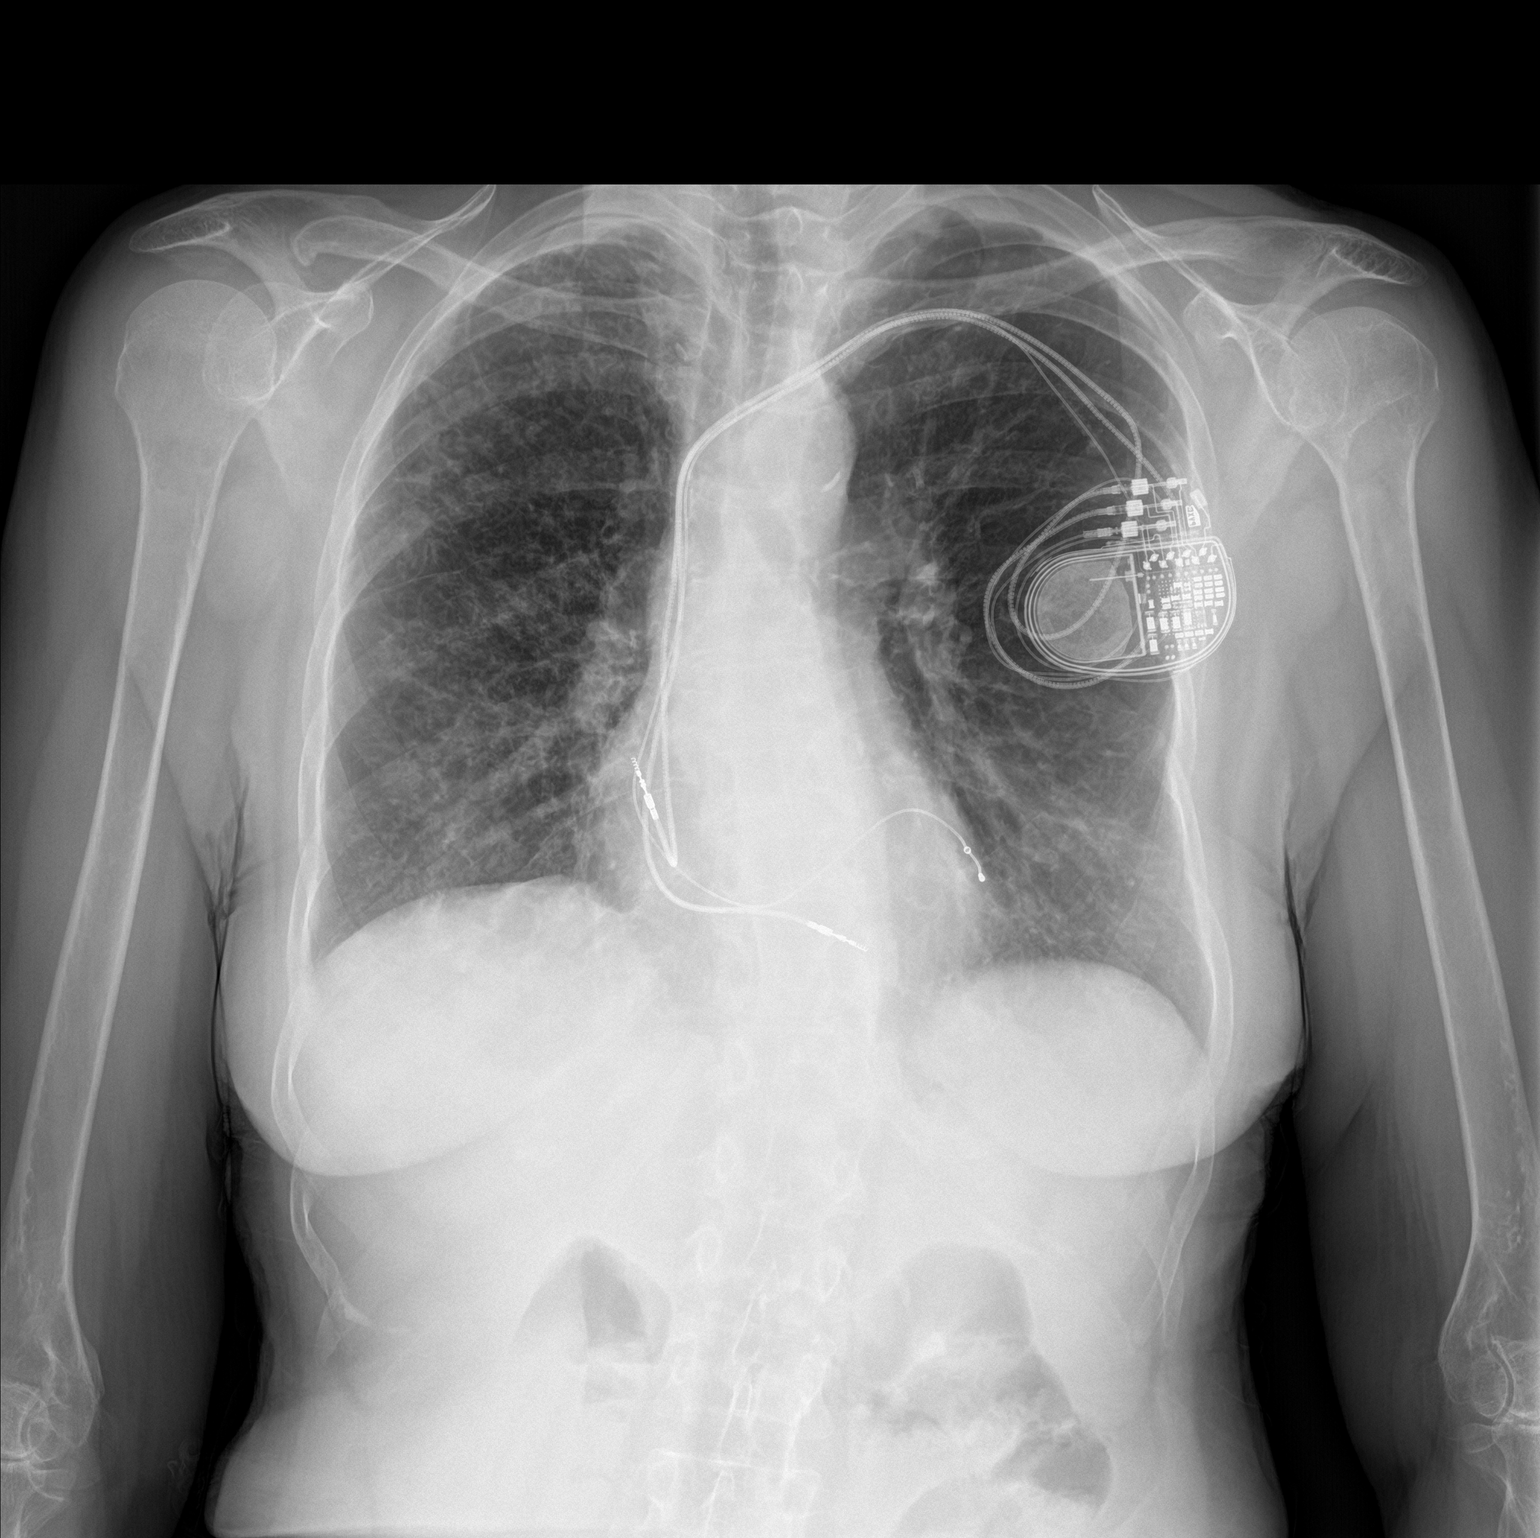

[chest lat]
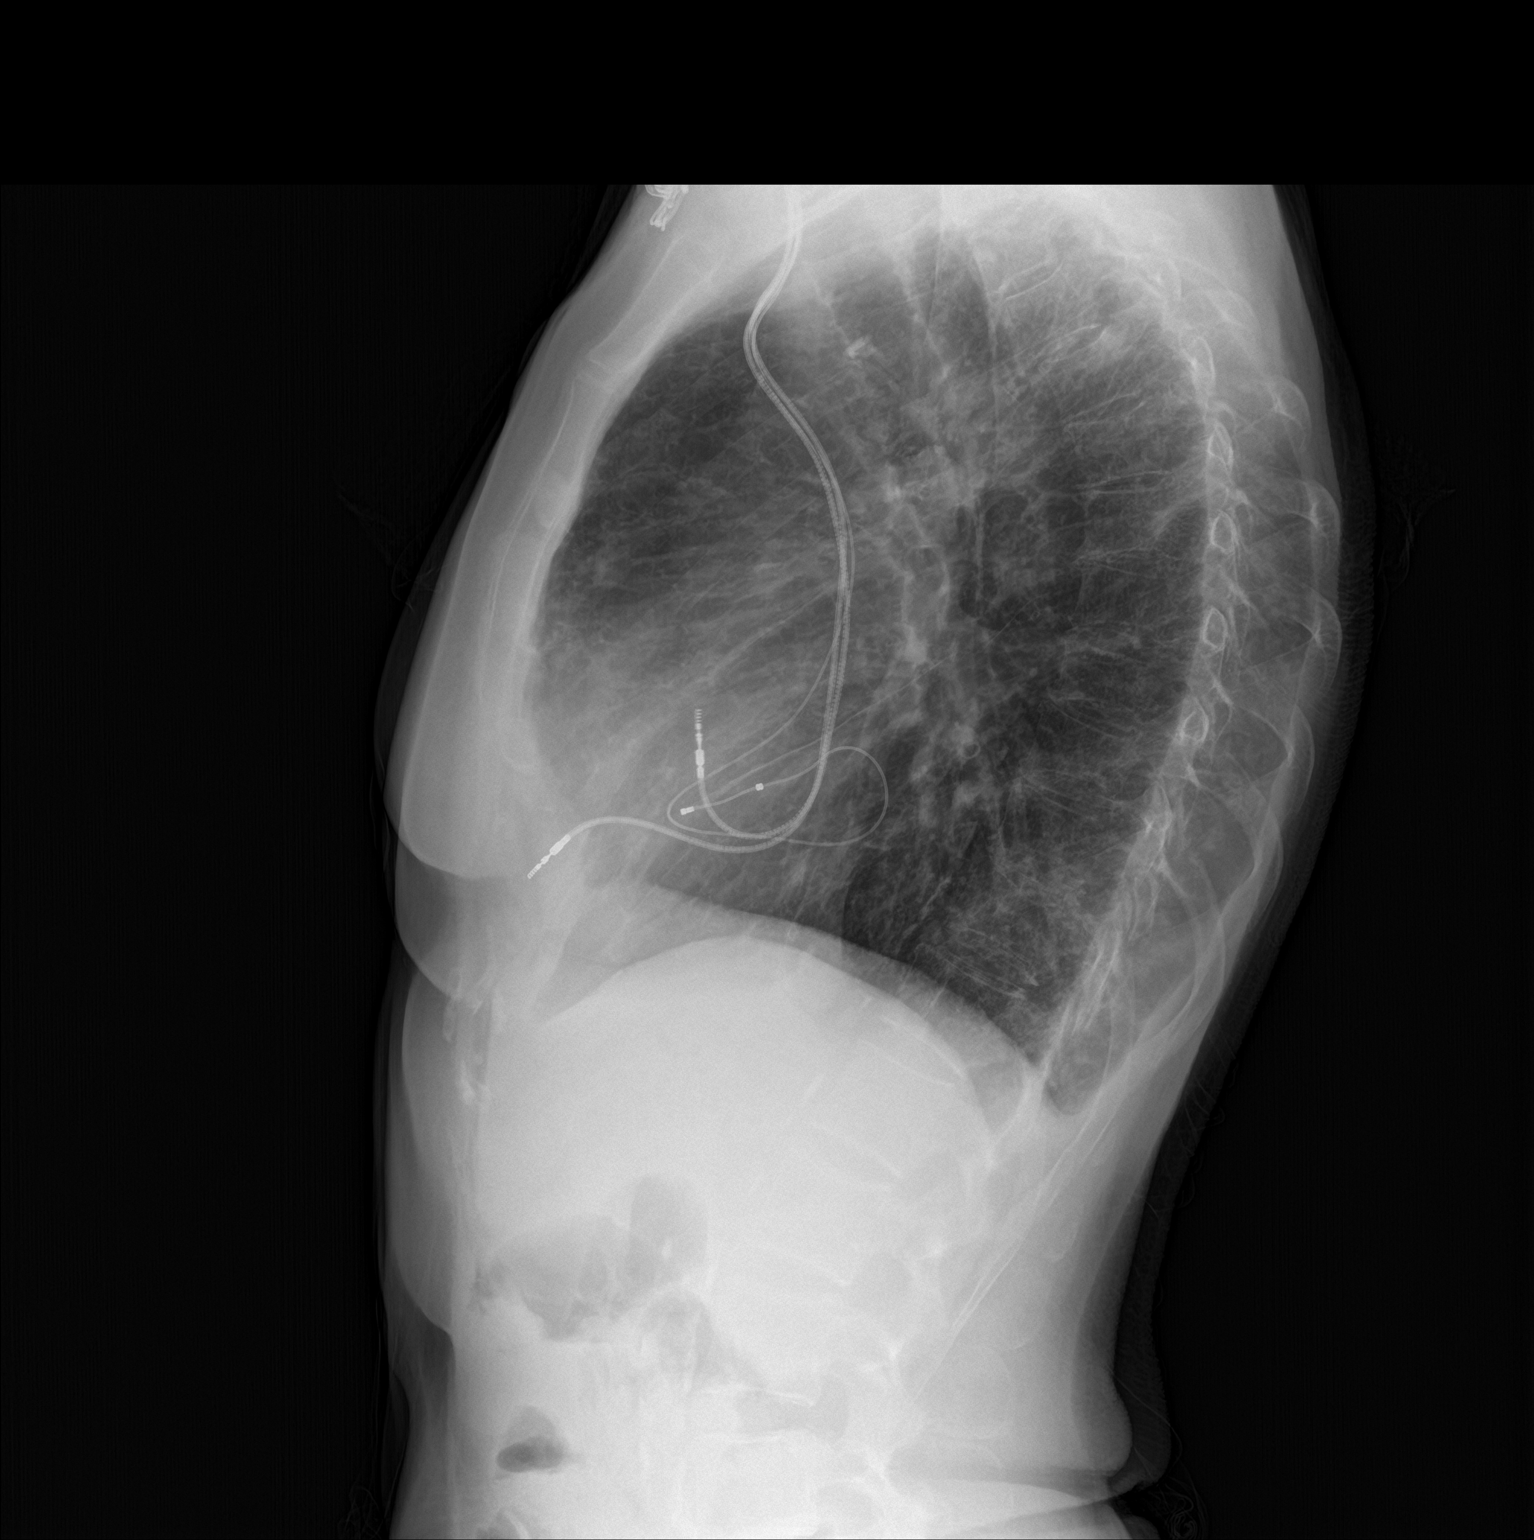

[2 of 2 positions shown; findings below may reference images not displayed]

FINDINGS: The pacer wires are stable. The cardiac silhouette, mediastinal and
hilar contours are within normal limits and unchanged. There is
tortuosity and calcification of the thoracic aorta. Chronic lung
changes with emphysema and pulmonary scarring. No obvious acute
overlying pulmonary infiltrate or pleural effusion. Possible nodule
in the right lower lobe not for certain seen on the prior study.
Chest CT suggested for further evaluation. The bony thorax is
intact.
IMPRESSION: Emphysematous changes and pulmonary scarring. No definite acute
overlying pulmonary infiltrate.

Possible new right lower lobe pulmonary nodule. Recommend chest CT
for further evaluation.

## 2018-01-20 ENCOUNTER — Other Ambulatory Visit: Payer: Self-pay | Admitting: Family Medicine

## 2018-01-20 ENCOUNTER — Telehealth: Payer: Self-pay

## 2018-01-20 ENCOUNTER — Ambulatory Visit (INDEPENDENT_AMBULATORY_CARE_PROVIDER_SITE_OTHER): Payer: PPO | Admitting: *Deleted

## 2018-01-20 DIAGNOSIS — R52 Pain, unspecified: Secondary | ICD-10-CM

## 2018-01-20 DIAGNOSIS — I480 Paroxysmal atrial fibrillation: Secondary | ICD-10-CM

## 2018-01-20 DIAGNOSIS — I428 Other cardiomyopathies: Secondary | ICD-10-CM | POA: Diagnosis not present

## 2018-01-20 MED ORDER — OXYCODONE-ACETAMINOPHEN 10-325 MG PO TABS: 1.0000 | ORAL_TABLET | ORAL | 0 refills | Status: DC | PRN

## 2018-01-20 NOTE — Telephone Encounter (Signed)
Copied from [place] [id number]. Topic: Quick Communication - Rx Refill/Question >> Jan 20, 2018  8:21 AM Gardiner Ramus wrote: Medication: oxyCODONE-acetaminophen (PERCOCET) 10-325 MG tablet [[id number]]  Has the patient contacted their pharmacy? No Preferred Pharmacy (with phone number or street name): [facility], [place] [phone] (Phone) [phone] (Fax)   Agent: Please be advised that RX refills may take up to 3 business days. We ask that you follow-up with your pharmacy.

## 2018-01-20 NOTE — Telephone Encounter (Signed)
LMOVM reminding pt to send remote transmission.   

## 2018-01-22 NOTE — Progress Notes (Signed)
Remote pacemaker transmission.   

## 2018-01-23 ENCOUNTER — Encounter: Payer: Self-pay | Admitting: Cardiology

## 2018-01-24 IMAGING — CT CT CHEST W/ CM
2 of 3 series · 14 of 36 positions shown, 17 images · IV contrast (iopamidol)
Comparison: 03/21/2017 chest radiograph.  05/04/2011 chest CT.

CLINICAL DATA: Possible new right lower lung pulmonary nodule on
recent chest radiograph.

EXAM:
CT CHEST WITH CONTRAST
TECHNIQUE: Multidetector CT imaging of the chest was performed during
intravenous contrast administration.
CONTRAST:  80mL RNJNY3-N11 IOPAMIDOL (RNJNY3-N11) INJECTION 61%

[Series 2: axial st · axial · 0.60mm/px · z∈[-285,-43]mm · 11 of 143 slices shown, 14 images]
[im 11/143  mediastinal]
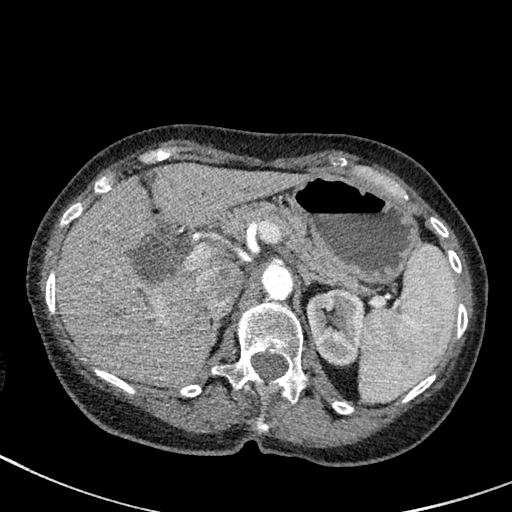
[im 11/143  lung]
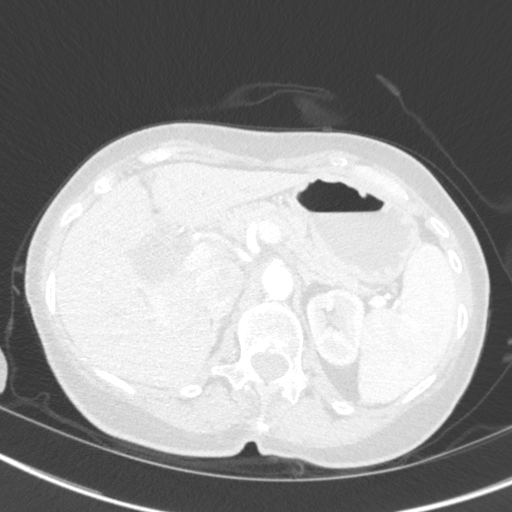
[im 22/143  lung]
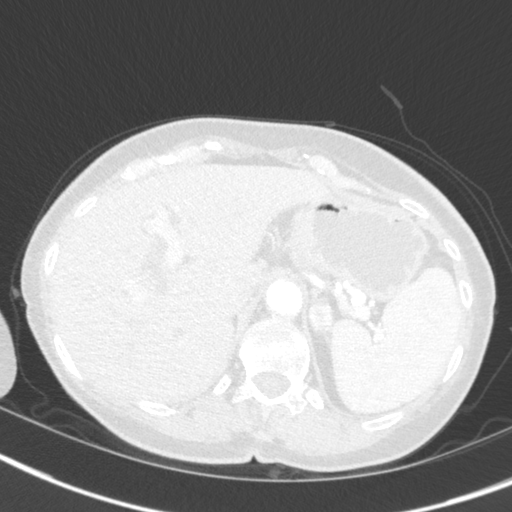
[im 32/143  lung]
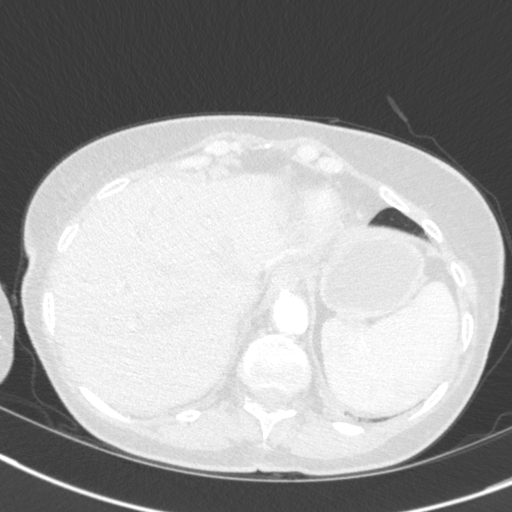
[im 48/143  lung]
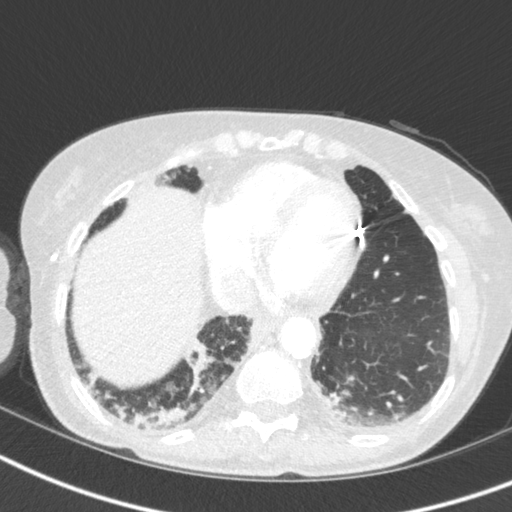
[im 58/143  mediastinal]
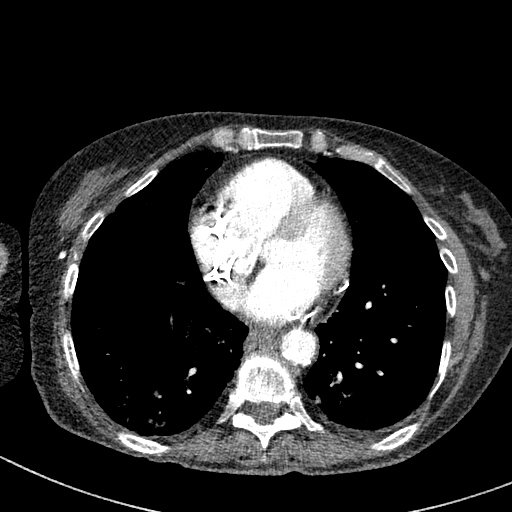
[im 58/143  lung]
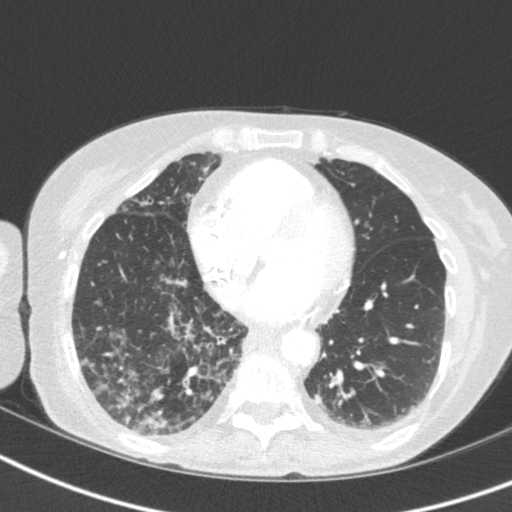
[im 74/143  lung]
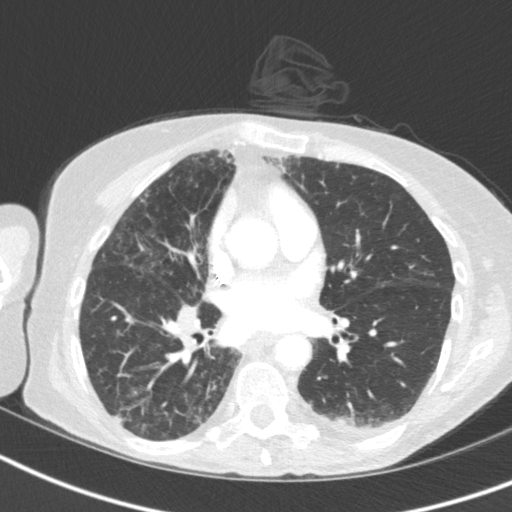
[im 85/143  lung]
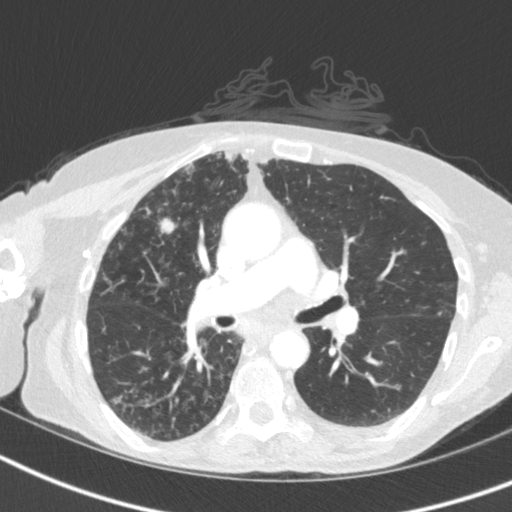
[im 95/143  lung]
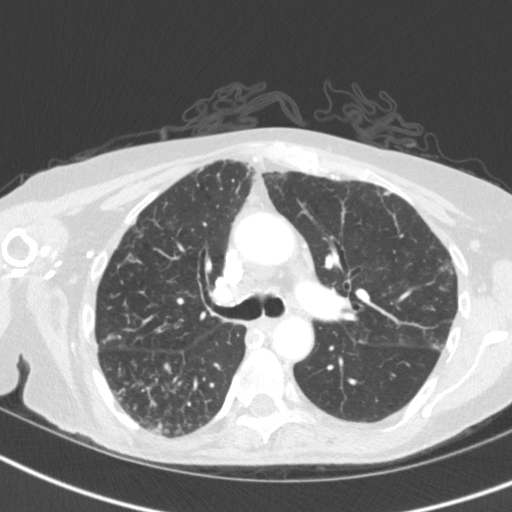
[im 111/143  mediastinal]
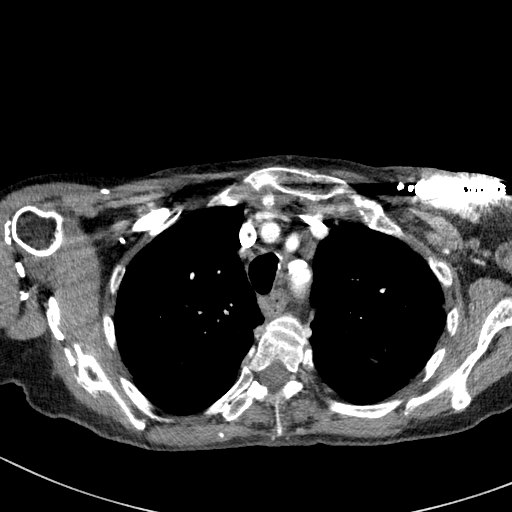
[im 111/143  lung]
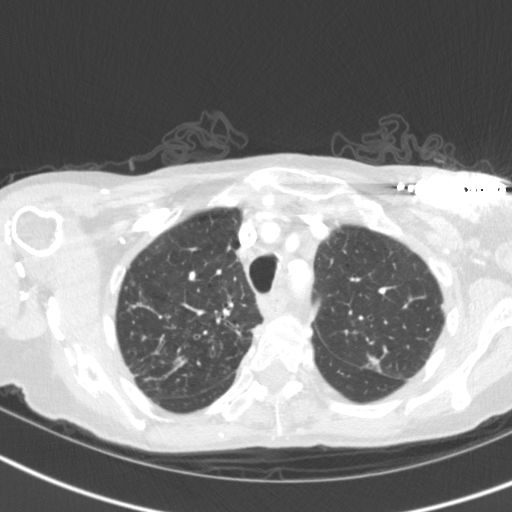
[im 121/143  lung]
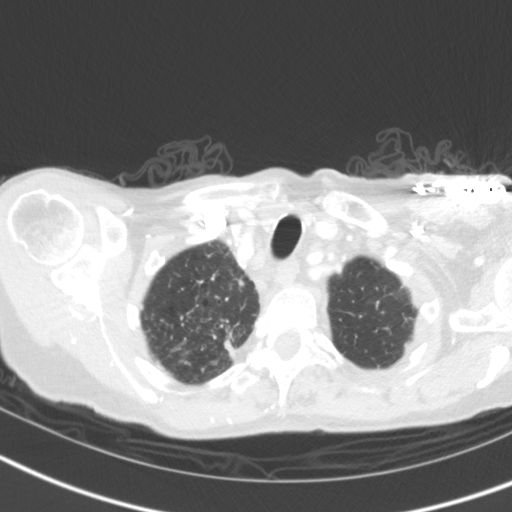
[im 132/143  lung]
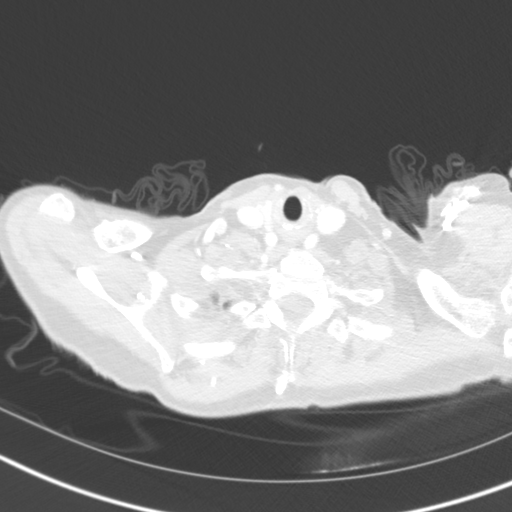

[Series 5: coronal · coronal · 0.59mm/px · 3 of 111 slices shown]
[im 23/111  lung]
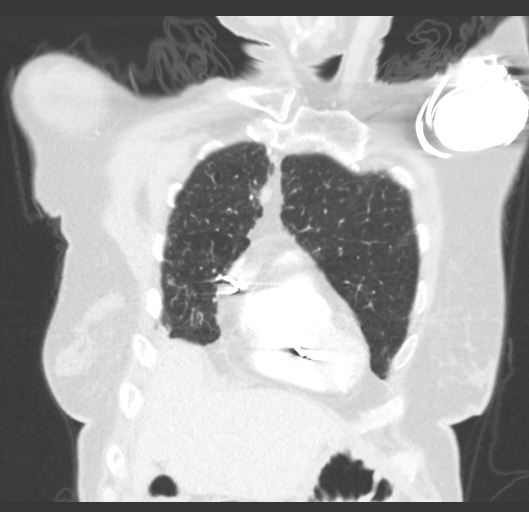
[im 45/111  lung]
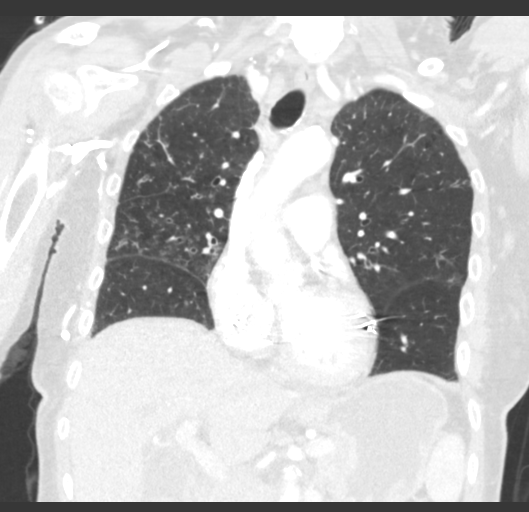
[im 67/111  lung]
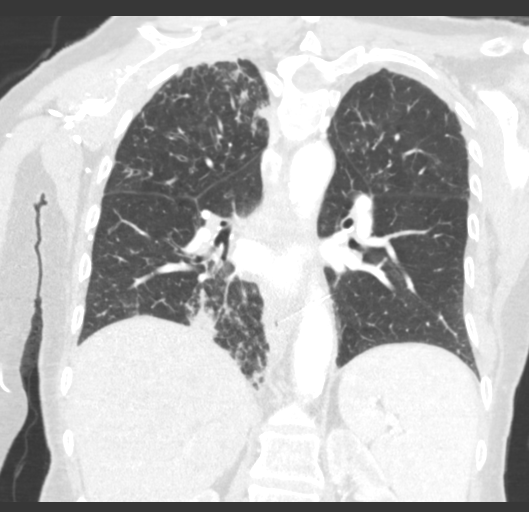

[14 of 36 positions shown; findings below may reference images not displayed]

FINDINGS: Cardiovascular: Normal heart size. No significant pericardial
fluid/thickening. Three lead left subclavian pacemaker is noted with
lead tips in right atrium, right ventricle and coronary sinus. Left
anterior descending and left circumflex coronary atherosclerosis.
Atherosclerotic nonaneurysmal thoracic aorta. Normal caliber
pulmonary arteries. No central pulmonary emboli.

Mediastinum/Nodes: No discrete thyroid nodules. Unremarkable
esophagus. No axillary adenopathy. Mild right paratracheal
adenopathy up to the 1.0 cm (series 2/image 25) is stable since
05/04/2011. Mildly enlarged 1.1 cm subcarinal node (series 2/image
57) is stable. Mild right hilar adenopathy up to 1.0 cm (series
2/image 54) appears stable compared to the noncontrast 05/04/2011
chest CT. No pathologically enlarged left hilar nodes.

Lungs/Pleura: No pneumothorax. No pleural effusion. There is
mild-to-moderate cylindrical bronchiectasis extensively distributed
throughout both lungs involving all lung lobes, asymmetrically
prominent in the right lung, with associated diffuse bronchial wall
thickening. There is associated prominent patchy tree-in-bud opacity
throughout both lungs at the areas of bronchiectasis, involving the
right greater than left lungs. There are patchy nodular confluent
foci of consolidation throughout the peribronchovascular and
peripheral lungs bilaterally, asymmetrically prominent in the right
lung. These findings are chronic and have progressed significantly
since 05/04/2011 chest CT. Several of nodular foci of consolidation
are new/increased, particularly in basilar lower lobes, right
greater than left. Representative new 1.0 cm peribronchovascular
right middle lobe nodular opacity (series 3/image 59).

Upper abdomen: Simple 1.0 cm cystic central splenic lesion,
compatible with a benign splenic lymphangioma. Mild diffuse
intrahepatic biliary ductal dilatation with dilated visualized
proximal common bile duct (8 mm diameter), not definitely changed
since 07/29/2014 noncontrast CT abdomen study.

Musculoskeletal: No aggressive appearing focal osseous lesions.
Mild-to-moderate thoracic spondylosis.
IMPRESSION: 1. Significant interval progression of spectrum of findings
suggestive of chronic infectious bronchiolitis due to atypical
mycobacterial infection (CATALONI), asymmetrically involving the right
lung. Diffuse cylindrical bronchiectasis, patchy tree-in-bud
opacities and patchy nodular peribronchovascular and peripheral foci
of consolidation. Follow-up high-resolution post treatment chest CT
may be considered in 6-12 months.
2. Stable chronic mediastinal and right hilar adenopathy, most
compatible with reactive adenopathy.
3. Two-vessel coronary atherosclerosis.
4. Nonspecific chronic intrahepatic and extrahepatic biliary ductal
dilatation, not appreciably changed since [DATE] CT. Recommend
correlation with serum bilirubin levels.

Aortic Atherosclerosis ([id number]).

## 2018-01-29 ENCOUNTER — Other Ambulatory Visit: Payer: Self-pay

## 2018-01-29 ENCOUNTER — Inpatient Hospital Stay: Payer: PPO

## 2018-01-29 ENCOUNTER — Inpatient Hospital Stay: Payer: PPO | Attending: Family | Admitting: Family

## 2018-01-29 VITALS — BP 134/86 | HR 80 | Temp 97.9°F | Resp 20 | Wt 91.5 lb

## 2018-01-29 DIAGNOSIS — D508 Other iron deficiency anemias: Secondary | ICD-10-CM | POA: Diagnosis present

## 2018-01-29 DIAGNOSIS — Z95828 Presence of other vascular implants and grafts: Secondary | ICD-10-CM

## 2018-01-29 DIAGNOSIS — K909 Intestinal malabsorption, unspecified: Secondary | ICD-10-CM | POA: Diagnosis not present

## 2018-01-29 DIAGNOSIS — Z7901 Long term (current) use of anticoagulants: Secondary | ICD-10-CM

## 2018-01-29 DIAGNOSIS — D500 Iron deficiency anemia secondary to blood loss (chronic): Secondary | ICD-10-CM

## 2018-01-29 LAB — CBC WITH DIFFERENTIAL (CANCER CENTER ONLY)
Abs Immature Granulocytes: 0.03 K/uL (ref 0.00–0.07)
Basophils Absolute: 0.1 K/uL (ref 0.0–0.1)
Basophils Relative: 1 %
Eosinophils Absolute: 0.3 K/uL (ref 0.0–0.5)
Eosinophils Relative: 4 %
HCT: 28.9 % — ABNORMAL LOW (ref 36.0–46.0)
Hemoglobin: 9.1 g/dL — ABNORMAL LOW (ref 12.0–15.0)
Immature Granulocytes: 0 %
Lymphocytes Relative: 6 %
Lymphs Abs: 0.5 K/uL — ABNORMAL LOW (ref 0.7–4.0)
MCH: 30.2 pg (ref 26.0–34.0)
MCHC: 31.5 g/dL (ref 30.0–36.0)
MCV: 96 fL (ref 80.0–100.0)
Monocytes Absolute: 0.7 K/uL (ref 0.1–1.0)
Monocytes Relative: 9 %
Neutro Abs: 6 K/uL (ref 1.7–7.7)
Neutrophils Relative %: 80 %
Platelet Count: 272 K/uL (ref 150–400)
RBC: 3.01 MIL/uL — ABNORMAL LOW (ref 3.87–5.11)
RDW: 12.4 % (ref 11.5–15.5)
WBC Count: 7.6 K/uL (ref 4.0–10.5)
nRBC: 0 % (ref 0.0–0.2)

## 2018-01-29 LAB — RETICULOCYTES
Immature Retic Fract: 6.6 % (ref 2.3–15.9)
RBC.: 3.01 MIL/uL — ABNORMAL LOW (ref 3.87–5.11)
Retic Count, Absolute: 46.1 10*3/uL (ref 19.0–186.0)
Retic Ct Pct: 1.5 % (ref 0.4–3.1)

## 2018-01-29 LAB — CMP (CANCER CENTER ONLY)
ALT: 24 U/L (ref 10–47)
AST: 28 U/L (ref 11–38)
Albumin: 3.8 g/dL (ref 3.5–5.0)
Alkaline Phosphatase: 127 U/L — ABNORMAL HIGH (ref 26–84)
Anion gap: 11 (ref 5–15)
BUN: 33 mg/dL — ABNORMAL HIGH (ref 7–22)
CO2: 31 mmol/L (ref 18–33)
Calcium: 8.8 mg/dL (ref 8.0–10.3)
Chloride: 99 mmol/L (ref 98–108)
Creatinine: 1.6 mg/dL — ABNORMAL HIGH (ref 0.60–1.20)
Glucose, Bld: 97 mg/dL (ref 73–118)
Potassium: 4.8 mmol/L — ABNORMAL HIGH (ref 3.3–4.7)
Sodium: 141 mmol/L (ref 128–145)
Total Bilirubin: 0.7 mg/dL (ref 0.2–1.6)
Total Protein: 7.1 g/dL (ref 6.4–8.1)

## 2018-01-29 MED ORDER — SODIUM CHLORIDE 0.9% FLUSH
10.0000 mL | INTRAVENOUS | Status: DC | PRN
  Administered 2018-01-29: 10 mL via INTRAVENOUS
  Filled 2018-01-29: qty 10

## 2018-01-29 MED ORDER — HEPARIN SOD (PORK) LOCK FLUSH 100 UNIT/ML IV SOLN
500.0000 [IU] | Freq: Once | INTRAVENOUS | Status: AC
  Administered 2018-01-29: 500 [IU] via INTRAVENOUS
  Filled 2018-01-29: qty 5

## 2018-01-29 NOTE — Progress Notes (Signed)
Hematology and Oncology Follow Up Visit  [name] [id number] 1945-04-21 72 y.o. 01/29/2018   Principle Diagnosis:  Iron deficiency anemia  Current Therapy:   IV iron as indicated - last received inAugust2019 x 2   Interim History:  [name] is here today for follow-up. She is symptomatic with fatigue and wanting to sleep all the time. She states that her calves have felt tender and her feet and ankle puffiness comes and goes.  She has not noted any episodes of bleeding, no bruising or petechiae.  Her SOB is stable on 3L [place] supplemental O2 24 hours a day.  No issue with infections. No fever, chills, n/v, cough, rash, dizziness, chest pain, palpitations, abdominal pain or changes in bowel or bladder habits.  No numbness or tingling in her extremities at this time.  No lymphadenopathy noted on exam.  She is eating well and staying hydrated. Her weight is stable.   ECOG Performance Status: 1 - Symptomatic but completely ambulatory  Medications:  Allergies as of 01/29/2018      Reactions   Dabigatran Etexilate Mesylate Other (See Comments)   INTERNAL BLEEDING-pradaxa   Talwin [pentazocine] Other (See Comments)   hallucinations   Lactate Rash      Medication List        Accurate as of 01/29/18  3:47 PM. Always use your most recent med list.          acetaminophen 325 MG tablet Commonly known as:  TYLENOL Take 2 tablets (650 mg total) by mouth every 4 (four) hours as needed.   amLODipine 2.5 MG tablet Commonly known as:  NORVASC Take 1 tablet (2.5 mg total) by mouth daily.   apixaban 5 MG Tabs tablet Commonly known as:  ELIQUIS Take 1 tablet (5 mg total) by mouth 2 (two) times daily.   atorvastatin 20 MG tablet Commonly known as:  LIPITOR Take 1 tablet (20 mg total) by mouth daily at 6 PM.   carvedilol 25 MG tablet Commonly known as:  COREG TAKE 1 TABLET BY MOUTH TWICE DAILY   denosumab 60 MG/ML Sosy injection Commonly known as:  PROLIA Inject 60 mg  into the skin every 6 (six) months.   furosemide 40 MG tablet Commonly known as:  LASIX TAKE ONE TABLET (40mg ) BY MOUTH IN THE MORNING   gabapentin 600 MG tablet Commonly known as:  NEURONTIN Take 1 tablet (600 mg total) by mouth 3 (three) times daily.   lidocaine-prilocaine cream Commonly known as:  EMLA Apply 1 application topically as needed.   nitroGLYCERIN 0.4 MG SL tablet Commonly known as:  NITROSTAT Place 1 tablet (0.4 mg total) under the tongue every 5 (five) minutes as needed. CHEST PAIN   oxyCODONE-acetaminophen 10-325 MG tablet Commonly known as:  PERCOCET Take 1 tablet by mouth every 4 (four) hours as needed. PAIN   pantoprazole 40 MG tablet Commonly known as:  PROTONIX Take 1 tablet (40 mg total) by mouth 2 (two) times daily.   promethazine 25 MG tablet Commonly known as:  PHENERGAN TAKE ONE TABLET BY MOUTH EVERY 8 HOURS AS NEEDED FOR NAUSEA   SPIRIVA RESPIMAT 1.25 MCG/ACT Aers Generic drug:  Tiotropium Bromide Monohydrate INHALE 2 PUFFS INTO THE LUNGS DAILY   tiZANidine 4 MG tablet Commonly known as:  ZANAFLEX Take 1 tablet (4 mg total) by mouth 3 (three) times daily as needed. for muscle spams   zolpidem 5 MG tablet Commonly known as:  AMBIEN 1 po qhs prn  Allergies:  Allergies  Allergen Reactions  . Dabigatran Etexilate Mesylate Other (See Comments)    INTERNAL BLEEDING-pradaxa  . Talwin [Pentazocine] Other (See Comments)    hallucinations  . Lactate Rash    Past Medical History, Surgical history, Social history, and Family History were reviewed and updated.  Review of Systems: All other 10 point review of systems is negative.   Physical Exam:  weight is 91 lb 8 oz (41.5 kg). Her oral temperature is 97.9 F (36.6 C). Her blood pressure is 134/86 and her pulse is 80. Her respiration is 20 and oxygen saturation is 100%.   Wt Readings from Last 3 Encounters:  01/29/18 91 lb 8 oz (41.5 kg)  12/18/17 90 lb (40.8 kg)  12/09/17 90 lb  (40.8 kg)    Ocular: Sclerae unicteric, pupils equal, round and reactive to light Ear-nose-throat: Oropharynx clear, dentition fair Lymphatic: No cervical, supraclavicular or axillary adenopathy Lungs no rales or rhonchi, good excursion bilaterally Heart regular rate and rhythm, no murmur appreciated Abd soft, nontender, positive bowel sounds, no liver or spleen tip palpated on exam, no fluid wave  MSK no focal spinal tenderness, no joint edema Neuro: non-focal, well-oriented, appropriate affect Breasts: Deferred   Lab Results  Component Value Date   WBC 7.6 01/29/2018   HGB 9.1 (L) 01/29/2018   HCT 28.9 (L) 01/29/2018   MCV 96.0 01/29/2018   PLT 272 01/29/2018   Lab Results  Component Value Date   FERRITIN 1,140 (H) 12/18/2017   IRON 68 12/18/2017   TIBC 198 (L) 12/18/2017   UIBC 130 12/18/2017   IRONPCTSAT 34 12/18/2017   Lab Results  Component Value Date   RETICCTPCT 1.5 01/29/2018   RBC 3.01 (L) 01/29/2018   RBC 3.01 (L) 01/29/2018   RETICCTABS 64.6 04/01/2009   No results found for: KPAFRELGTCHN, LAMBDASER, KAPLAMBRATIO No results found for: IGGSERUM, IGA, IGMSERUM No results found for: Ronnald Ramp, A1GS, A2GS, Violet Baldy, MSPIKE, SPEI   Chemistry      Component Value Date/Time   NA 141 01/29/2018 1330   K 4.8 (H) 01/29/2018 1330   CL 99 01/29/2018 1330   CO2 31 01/29/2018 1330   BUN 33 (H) 01/29/2018 1330   CREATININE 1.60 (H) 01/29/2018 1330   CREATININE 0.91 03/16/2016 1539      Component Value Date/Time   CALCIUM 8.8 01/29/2018 1330   ALKPHOS 127 (H) 01/29/2018 1330   AST 28 01/29/2018 1330   ALT 24 01/29/2018 1330   BILITOT 0.7 01/29/2018 1330      Impression and Plan: [name] is a very pleasant 72 yo caucasian female with iron deficiency anemia secondary to malabsorption and possible GI blood loss with Eliquis.  She is symptomatic as mentioned above and Hgb is down at 9.1, MCV 96.  We will see what her iron studies show  and bring her back in for infusion.  We will see her in another months.  She will contact our office with any questions or concerns. We can certainly see her sooner if need be.   Laverna Peace, NP 11/13/20193:47 PM

## 2018-01-30 LAB — FERRITIN: Ferritin: 1381 ng/mL — ABNORMAL HIGH (ref 11–307)

## 2018-01-30 LAB — IRON AND TIBC
Iron: 60 ug/dL (ref 41–142)
Saturation Ratios: 31 % (ref 21–57)
TIBC: 193 ug/dL — ABNORMAL LOW (ref 236–444)
UIBC: 133 ug/dL (ref 120–384)

## 2018-01-31 ENCOUNTER — Telehealth: Payer: Self-pay | Admitting: *Deleted

## 2018-01-31 NOTE — Telephone Encounter (Signed)
Received Physician Orders from [place] for CSNP eligibility through HTA and Medicare; forwarded to provider/SLS 11/15

## 2018-02-03 NOTE — Progress Notes (Signed)
HPI She presents for followup of a nonischemic cardiomyopathy and atrial fibrillation. She was in the hospital twice once in March with a TIA.  She had previous atrial fibrillation and we had avoided anticoagulation because she had no recurrence and she had risk for anticoagulants but she was noted to be apparently atrial fibrillation at that time and so the decision was  made for her to start anticoagulant.  She came back in the hospital in April of this year with acute cholecystitis and eventually had to have cholecystectomy.  She had problems with labile blood pressure.  Echocardiogram done in March demonstrated a well-preserved ejection fraction.  She called recently with swelling in her legs.    She said this is been going on for several days.  She has bruising down at her left medial malleolus.  She has swelling and tenderness in her left calf.  She did not have any trauma.  She is not had any other change in her chronic dyspnea.  She is been on 3 L continuously for some time for chronic lung disease.  She does report that her hemoglobin which is chronically down has been down from 10.7-9.1 recently.  I also looked at her labs and found that her creatinine which had been normal was up to 1.6.  She is waiting to hear back from hematology about whether she needs any further management for her iron deficiency.  It looks like her iron and ferritin are normal.  She otherwise has been tolerating her anticoagulants.  She is not been having any GI bleeding.  She is not been having any excessive bruising.  Allergies  Allergen Reactions  . Dabigatran Etexilate Mesylate Other (See Comments)    INTERNAL BLEEDING-pradaxa  . Talwin [Pentazocine] Other (See Comments)    hallucinations  . Lactate Rash    Current Outpatient Medications  Medication Sig Dispense Refill  . acetaminophen (TYLENOL) 325 MG tablet Take 2 tablets (650 mg total) by mouth every 4 (four) hours as needed.    [name] amLODipine (NORVASC)  2.5 MG tablet Take 1 tablet (2.5 mg total) by mouth daily. 90 tablet 3  . apixaban (ELIQUIS) 5 MG TABS tablet Take 1 tablet (5 mg total) by mouth 2 (two) times daily. 180 tablet 1  . atorvastatin (LIPITOR) 20 MG tablet Take 1 tablet (20 mg total) by mouth daily at 6 PM. 90 tablet 1  . carvedilol (COREG) 25 MG tablet TAKE 1 TABLET BY MOUTH TWICE DAILY 180 tablet 1  . denosumab (PROLIA) 60 MG/ML SOSY injection Inject 60 mg into the skin every 6 (six) months.    . furosemide (LASIX) 40 MG tablet TAKE ONE TABLET (40mg ) BY MOUTH IN THE MORNING 90 tablet 3  . gabapentin (NEURONTIN) 600 MG tablet Take 1 tablet (600 mg total) by mouth 3 (three) times daily.    [name] lidocaine-prilocaine (EMLA) cream Apply 1 application topically as needed. 30 g 6  . nitroGLYCERIN (NITROSTAT) 0.4 MG SL tablet Place 1 tablet (0.4 mg total) under the tongue every 5 (five) minutes as needed. CHEST PAIN 25 tablet 5  . oxyCODONE-acetaminophen (PERCOCET) 10-325 MG tablet Take 1 tablet by mouth every 4 (four) hours as needed. PAIN 120 tablet 0  . pantoprazole (PROTONIX) 40 MG tablet Take 1 tablet (40 mg total) by mouth 2 (two) times daily. 60 tablet 0  . promethazine (PHENERGAN) 25 MG tablet TAKE ONE TABLET BY MOUTH EVERY 8 HOURS AS NEEDED FOR NAUSEA 40 tablet 2  . SPIRIVA  RESPIMAT 1.25 MCG/ACT AERS INHALE 2 PUFFS INTO THE LUNGS DAILY 1 Inhaler 5  . tiZANidine (ZANAFLEX) 4 MG tablet Take 1 tablet (4 mg total) by mouth 3 (three) times daily as needed. for muscle spams 90 tablet 2  . zolpidem (AMBIEN) 5 MG tablet 1 po qhs prn 30 tablet 2   No current facility-administered medications for this visit.     Past Medical History:  Diagnosis Date  . Anemia   . Arthritis    right hip  . Atrial fibrillation ([place])   . CAD (coronary artery disease)    mild disease per cath in 2011  . CHF (congestive heart failure) ([place])   . COPD (chronic obstructive pulmonary disease) (H[facility])    continuous O2- 2l  . Diverticulitis   . Elbow fracture,  left aug 2012  . Fibromyalgia    COUPLE OF TIMES A YEAR-PT HAS EPISODES OF CONFUSION-USUALLY INVOLVES DAY/NIGHT REVERSAL AND EPISODES OF TWITCHING AND FALLS--SEES DR. Jacelyn Grip - NEUROLOGIST-LAST SEEN 07/10/11  . Gastroparesis   . GERD (gastroesophageal reflux disease)   . H/O: GI bleed    from Pradaxa  . Hepatitis    hx of in high school   . HTN (hypertension)    under control; has been on med. x "years"  . Hx MRSA infection   . Hx of gastric ulcer   . Hx of stress fx aug 2011   right hip   . Iron malabsorption 08/14/2017  . LBBB (left bundle branch block)   . Oxygen dependent    2 liters via nasal cannula at all times  . Pacemaker    CRT therapy; followed by Dr. Caryl Comes  . Pleural effusion     s/p right thoracentesis 03/09  . Pneumonia - 03/09, 12/09, 02/10, 06/10   MOST RECENT FEB 2013  . Primary dilated cardiomyopathy (H[facility])    EF 45 to 50% per echo in Jan 2012  . RLS (restless legs syndrome)   . Silent aspiration   . Small bowel obstruction ([place])   . Urinary incontinence     -INTERSTIM IMPLANT NOT FUNCTIONING PER PT  . Venous embolism and thrombosis of subclavian vein ([place])    after pacemaker insertion Oct 2011  . Vitamin B12 deficiency     Past Surgical History:  Procedure Laterality Date  . APPENDECTOMY    . CARDIAC CATHETERIZATION  04/08/2006, 11/16/2009  . CHOLECYSTECTOMY N/A 06/27/2017   Procedure: OPEN CHOLECYSTECTOMY;  Surgeon: Ralene Ok, MD;  Location: [place];  Service: General;  Laterality: N/A;  . COLECTOMY     Intestinal resection (5times)  . COLECTOMY  02/04/2000   ex. lap., intra-abd. subtotal colectomy with ileosigmoid colon anastomosies and lysis of adhesions  . CYSTOSCOPY  11/11/2008  . CYSTOSCOPY W/ RETROGRADES  11/11/2008   right  . CYSTOSCOPY WITH INJECTION  04/30/2006   transurethral collagen injection; incision vaginal stenosis  . ERCP N/A 06/23/2017   Procedure: ENDOSCOPIC RETROGRADE CHOLANGIOPANCREATOGRAPHY (ERCP);  Surgeon: Carol Ada, MD;   Location: [facility];  Service: Endoscopy;  Laterality: N/A;  . ERCP N/A 06/24/2017   Procedure: ENDOSCOPIC RETROGRADE CHOLANGIOPANCREATOGRAPHY (ERCP);  Surgeon: [name], MD;  [facility];  Service: Endoscopy;  Laterality: N/A;  . ESOPHAGOGASTRODUODENOSCOPY N/A 02/02/2013   Procedure: ESOPHAGOGASTRODUODENOSCOPY (EGD);  Surgeon: Lafayette Dragon, MD;  Location: [facility];  Service: Endoscopy;  Laterality: N/A;  . EXPLORATORY LAPAROTOMY  04/27/2009   lysis of adhesions, gastrostomy tube  . GASTROCUTANEOUS FISTULA CLOSURE    . INTERSTIM IMPLANT PLACEMENT  05/28/2006 - stage I   06/05/2006 - stage II  . INTERSTIM IMPLANT PLACEMENT  02/05/2012   Procedure: [name] IMPLANT FIRST STAGE;  Surgeon: Reece Packer, MD;  Location: [facility];  Service: Urology;  Laterality: N/A;  Replacement of Interstim Lead     . INTERSTIM IMPLANT REVISION  03/06/2011   Procedure: REVISION OF [name];  Surgeon: Reece Packer, MD;  Location: [facility];  Service: Urology;  Laterality: N/A;  Replacement of Neurostimulator  . INTERSTIM IMPLANT REVISION  10/23/2007  . IR IMAGING GUIDED PORT INSERTION  10/14/2017  . PACEMAKER PLACEMENT  12/21/2009  . Peg removed     with complications  . PORT-A-CATH REMOVAL  07/27/2011   Procedure: REMOVAL PORT-A-CATH;  Surgeon: Rolm Bookbinder, MD;  Location: [place];  Service: General;  Laterality: N/A;  . PORTACATH PLACEMENT  12/16/2009  . SAVORY DILATION N/A 02/02/2013   Procedure: SAVORY DILATION;  Surgeon: Lafayette Dragon, MD;  Location: [facility];  Service: Endoscopy;  Laterality: N/A;  . SMALL INTESTINE SURGERY  05/20/2001   ex. lap., resection of small bowel stricture; gastrostomy; insertion central line  . TONSILLECTOMY  age 63  . TOTAL ABDOMINAL HYSTERECTOMY     complete  . TOTAL HIP ARTHROPLASTY  08/03/2011   Procedure: TOTAL HIP ARTHROPLASTY ANTERIOR APPROACH;  Surgeon: Mcarthur Rossetti, MD;  Location: [facility];  Service: Orthopedics;   Laterality: Right;    ROS:   As stated in the HPI and negative for all other systems.  PHYSICAL EXAM BP (!) 142/70   Pulse 74   Ht 5' (1.524 m)   Wt 91 lb (41.3 kg)   BMI 17.77 kg/m   GENERAL:  Well appearing NECK:  No jugular venous distention, waveform within normal limits, carotid upstroke brisk and symmetric, no bruits, no thyromegaly LUNGS:  Clear to auscultation bilaterally CHEST:  Unremarkable, well-healed pacemaker pocket and Port-A-Cath HEART:  PMI not displaced or sustained,S1 and S2 within normal limits, no S3, no S4, no clicks, no rubs, no murmurs ABD:  Flat, positive bowel sounds normal in frequency in pitch, no bruits, no rebound, no guarding, no midline pulsatile mass, no hepatomegaly, no splenomegaly EXT:  2 plus pulses throughout, left calf and ankle mild nonpitting swelling with some calf tenderness but no cord, bruising in the medial malleolus and below,  no cyanosis no clubbing   EKG: Sinus rhythm with ventricular pacing 100% capture  ASSESSMENT AND PLAN   ATRIAL FIBRILLATION -  [name] has a CHA2DS2 - VASc score of 4 with a risk of stroke of 4%.  Pending follow-up labs I am going to continue the anticoagulation for now.  I will also be following up the leg swelling as below.  CARDIOMYOPATHY, PRIMARY, DILATED -  Her EF is now reported to be 65%.   It was 50% in 2012.  No change in therapy.  HYPERTENSION -  Blood pressure is very slightly elevated but she will continue the meds as listed.   SYNCOPE - She is had no recurrence of this no further work-up.    ELEVATED CREAT - This is a new finding and I will follow-up with repeat labs.  LEG SWELLING -   The patient has new leg swelling.  She is on anticoagulant so I do not strongly suspect DVT.  I do not see evidence of a ruptured Baker's cyst.  She does have a drop in her hemoglobin.  I am going to start with a venous Doppler of  her left lower extremity.  I will follow-up with labs to include a  CBC repeat to make sure her hemoglobin is not dropping further.  I will have a low threshold for CT of her lower extremity as well.  For now as above I will continue the anticoagulation.

## 2018-02-04 ENCOUNTER — Ambulatory Visit: Payer: PPO | Admitting: Cardiology

## 2018-02-04 ENCOUNTER — Ambulatory Visit (INDEPENDENT_AMBULATORY_CARE_PROVIDER_SITE_OTHER): Payer: PPO | Admitting: Cardiology

## 2018-02-04 ENCOUNTER — Encounter: Payer: Self-pay | Admitting: Cardiology

## 2018-02-04 VITALS — BP 142/70 | HR 74 | Ht 60.0 in | Wt 91.0 lb

## 2018-02-04 DIAGNOSIS — I480 Paroxysmal atrial fibrillation: Secondary | ICD-10-CM

## 2018-02-04 DIAGNOSIS — M7989 Other specified soft tissue disorders: Secondary | ICD-10-CM | POA: Diagnosis not present

## 2018-02-04 DIAGNOSIS — N179 Acute kidney failure, unspecified: Secondary | ICD-10-CM | POA: Diagnosis not present

## 2018-02-04 NOTE — Patient Instructions (Signed)
Medication Instructions:  Continue current medications  If you need a refill on your cardiac medications before your next appointment, please call your pharmacy.  Labwork: None Ordered   If you have labs (blood work) drawn today and your tests are completely normal, you will receive your results only by: [name] MyChart Message (if you have MyChart) OR . A paper copy in the mail If you have any lab test that is abnormal or we need to change your treatment, we will call you to review the results.  Testing/Procedures: Your physician has requested that you have a lower extremity venous duplex. This test is an ultrasound of the veins in the legs or arms. It looks at venous blood flow that carries blood from the heart to the legs or arms. Allow one hour for a Lower Venous exam. Allow thirty minutes for an Upper Venous exam. There are no restrictions or special instructions.  Follow-Up: You will need a follow up appointment in 6 Months.  Please call our office 2 months in [phone]) to schedule the (6 Months) appointment.  You may see  DR Percival Spanish, or one of the following Advanced Practice Providers on your designated Care Team:    . Jory Sims, DNP, ANP . Rhonda Barrett, PA-C  . Kerin Ransom, [place]  . Almyra Deforest, PA-C . Fabian Sharp, PA-C  At [facility], you and your health needs are our priority.  As part of our continuing mission to provide you with exceptional heart care, we have created designated Provider Care Teams.  These Care Teams include your primary Cardiologist (physician) and Advanced Practice Providers (APPs -  Physician Assistants and Nurse Practitioners) who all work together to provide you with the care you need, when you need it.   Thank you for choosing [facility] at [facility]!!

## 2018-02-05 ENCOUNTER — Ambulatory Visit (HOSPITAL_COMMUNITY)
Admission: RE | Admit: 2018-02-05 | Discharge: 2018-02-05 | Disposition: A | Discharge status: Home / Self Care | Payer: PPO | Source: Ambulatory Visit | Attending: Internal Medicine | Admitting: Internal Medicine

## 2018-02-05 ENCOUNTER — Telehealth: Payer: Self-pay | Admitting: *Deleted

## 2018-02-05 DIAGNOSIS — M7989 Other specified soft tissue disorders: Secondary | ICD-10-CM | POA: Diagnosis present

## 2018-02-05 DIAGNOSIS — Z79899 Other long term (current) drug therapy: Secondary | ICD-10-CM

## 2018-02-05 NOTE — Telephone Encounter (Signed)
-----   Message from Minus Breeding, MD sent at 02/04/2018  6:10 PM EST ----- She needs to have a CBC and BMET today but needs to have it drawn at the cancer center through her port.  Please call and make sure she goes after we enter the orders.  Thanks.

## 2018-02-05 NOTE — Telephone Encounter (Signed)
Spoke with pt about getting some blood work done at her cancer center since she have a port in, pt stated she will call the cancer center to set an appt up for lab work.

## 2018-02-10 DIAGNOSIS — I4891 Unspecified atrial fibrillation: Secondary | ICD-10-CM | POA: Diagnosis not present

## 2018-02-10 DIAGNOSIS — R05 Cough: Secondary | ICD-10-CM | POA: Diagnosis not present

## 2018-02-10 DIAGNOSIS — J449 Chronic obstructive pulmonary disease, unspecified: Secondary | ICD-10-CM | POA: Diagnosis not present

## 2018-02-11 ENCOUNTER — Other Ambulatory Visit: Payer: PPO

## 2018-02-18 ENCOUNTER — Other Ambulatory Visit: Payer: Self-pay | Admitting: Family Medicine

## 2018-02-18 DIAGNOSIS — R52 Pain, unspecified: Secondary | ICD-10-CM

## 2018-02-18 MED ORDER — OXYCODONE-ACETAMINOPHEN 10-325 MG PO TABS: 1.0000 | ORAL_TABLET | ORAL | 0 refills | Status: DC | PRN

## 2018-02-18 NOTE — Telephone Encounter (Signed)
Refill request for oxycodone.,   Last OV: 12/09/2017 Last Fill: 01/20/2018 #20 and 0RF Pt sig: 1 tab q4h prn UDS: 03/07/2017 Low risk

## 2018-02-18 NOTE — Telephone Encounter (Signed)
Copied from [place] [id number]. Topic: Quick Communication - Rx Refill/Question >> Feb 18, 2018 12:11 PM Selinda Flavin B, NT wrote: Medication: oxyCODONE-acetaminophen (PERCOCET) 10-325 MG tablet  Has the patient contacted their pharmacy? Yes.   (Agent: If no, request that the patient contact the pharmacy for the refill.) (Agent: If yes, when and what did the pharmacy advise?)  Preferred Pharmacy (with phone number or street name): [facility] [place], [place]  Agent: Please be advised that RX refills may take up to 3 business days. We ask that you follow-up with your pharmacy.

## 2018-02-26 ENCOUNTER — Inpatient Hospital Stay: Payer: PPO

## 2018-02-26 ENCOUNTER — Inpatient Hospital Stay: Payer: PPO | Attending: Family | Admitting: Family

## 2018-02-26 VITALS — BP 121/47 | HR 69 | Temp 98.2°F | Resp 21 | Ht 60.0 in | Wt 90.0 lb

## 2018-02-26 DIAGNOSIS — D508 Other iron deficiency anemias: Secondary | ICD-10-CM | POA: Diagnosis present

## 2018-02-26 DIAGNOSIS — R7989 Other specified abnormal findings of blood chemistry: Secondary | ICD-10-CM

## 2018-02-26 DIAGNOSIS — K909 Intestinal malabsorption, unspecified: Secondary | ICD-10-CM

## 2018-02-26 DIAGNOSIS — Z7902 Long term (current) use of antithrombotics/antiplatelets: Secondary | ICD-10-CM | POA: Diagnosis not present

## 2018-02-26 DIAGNOSIS — R0602 Shortness of breath: Secondary | ICD-10-CM | POA: Diagnosis not present

## 2018-02-26 DIAGNOSIS — D500 Iron deficiency anemia secondary to blood loss (chronic): Secondary | ICD-10-CM

## 2018-02-26 DIAGNOSIS — Z9981 Dependence on supplemental oxygen: Secondary | ICD-10-CM | POA: Diagnosis not present

## 2018-02-26 DIAGNOSIS — Z95828 Presence of other vascular implants and grafts: Secondary | ICD-10-CM

## 2018-02-26 LAB — CBC WITH DIFFERENTIAL (CANCER CENTER ONLY)
Abs Immature Granulocytes: 0.03 10*3/uL (ref 0.00–0.07)
Basophils Absolute: 0 10*3/uL (ref 0.0–0.1)
Basophils Relative: 1 %
Eosinophils Absolute: 0.2 10*3/uL (ref 0.0–0.5)
Eosinophils Relative: 3 %
HCT: 30.7 % — ABNORMAL LOW (ref 36.0–46.0)
Hemoglobin: 9.5 g/dL — ABNORMAL LOW (ref 12.0–15.0)
Immature Granulocytes: 1 %
Lymphocytes Relative: 9 %
Lymphs Abs: 0.5 10*3/uL — ABNORMAL LOW (ref 0.7–4.0)
MCH: 30.2 pg (ref 26.0–34.0)
MCHC: 30.9 g/dL (ref 30.0–36.0)
MCV: 97.5 fL (ref 80.0–100.0)
Monocytes Absolute: 0.7 10*3/uL (ref 0.1–1.0)
Monocytes Relative: 12 %
Neutro Abs: 4 10*3/uL (ref 1.7–7.7)
Neutrophils Relative %: 74 %
Platelet Count: 208 10*3/uL (ref 150–400)
RBC: 3.15 MIL/uL — ABNORMAL LOW (ref 3.87–5.11)
RDW: 12.3 % (ref 11.5–15.5)
WBC Count: 5.3 10*3/uL (ref 4.0–10.5)
nRBC: 0 % (ref 0.0–0.2)

## 2018-02-26 LAB — CMP (CANCER CENTER ONLY)
ALT: 11 U/L (ref 0–44)
AST: 19 U/L (ref 15–41)
Albumin: 3.7 g/dL (ref 3.5–5.0)
Alkaline Phosphatase: 102 U/L (ref 38–126)
Anion gap: 4 — ABNORMAL LOW (ref 5–15)
BUN: 13 mg/dL (ref 8–23)
CO2: 29 mmol/L (ref 22–32)
Calcium: 8 mg/dL — ABNORMAL LOW (ref 8.9–10.3)
Chloride: 102 mmol/L (ref 98–111)
Creatinine: 0.81 mg/dL (ref 0.44–1.00)
GFR, Est AFR Am: >60 mL/min (ref >60)
GFR, Estimated: >60 mL/min (ref >60)
Glucose, Bld: 98 mg/dL (ref 70–99)
Potassium: 4.3 mmol/L (ref 3.5–5.1)
Sodium: 135 mmol/L (ref 135–145)
Total Bilirubin: 0.4 mg/dL (ref 0.3–1.2)
Total Protein: 6.4 g/dL — ABNORMAL LOW (ref 6.5–8.1)

## 2018-02-26 LAB — RETICULOCYTES
Immature Retic Fract: 4.4 % (ref 2.3–15.9)
RBC.: 3.15 MIL/uL — ABNORMAL LOW (ref 3.87–5.11)
Retic Count, Absolute: 45.4 10*3/uL (ref 19.0–186.0)
Retic Ct Pct: 1.4 % (ref 0.4–3.1)

## 2018-02-26 MED ORDER — SODIUM CHLORIDE 0.9% FLUSH
10.0000 mL | Freq: Once | INTRAVENOUS | Status: AC
  Administered 2018-02-26: 10 mL via INTRAVENOUS
  Filled 2018-02-26: qty 10

## 2018-02-26 MED ORDER — HEPARIN SOD (PORK) LOCK FLUSH 100 UNIT/ML IV SOLN
500.0000 [IU] | Freq: Once | INTRAVENOUS | Status: AC
  Administered 2018-02-26: 500 [IU] via INTRAVENOUS
  Filled 2018-02-26: qty 5

## 2018-02-26 NOTE — Progress Notes (Signed)
Hematology and Oncology Follow Up Visit  [name] [id number] 1945/10/07 72 y.o. 02/26/2018   Principle Diagnosis:  Iron deficiency anemia  Current Therapy:   IV iron as indicated   Interim History: [name] is here today for follow-up. She is doing well but still has some fatigue.  She is on 3L supplemental O2 24 hours a day and feels that her SOB is stable.  No fever, chills, n/v, cough, rash, dizziness, chest pain, palpitations, abdominal pain or changes in bowel or bladder habits.  No swelling or tenderness in her extremities at this time. She will occasionally have tingling in her feet.  No episodes of bleeding. She does bruise easily on Eliquis. No lymphadenopathy noted on exam.  She has maintained a good appetite and is staying well hydrated. Her weight is stable.   ECOG Performance Status: 1 - Symptomatic but completely ambulatory  Medications:  Allergies as of 02/26/2018      Reactions   Dabigatran Etexilate Mesylate Other (See Comments)   INTERNAL BLEEDING-pradaxa   Talwin [pentazocine] Other (See Comments)   hallucinations   Lactate Rash      Medication List        Accurate as of 02/26/18  1:36 PM. Always use your most recent med list.          acetaminophen 325 MG tablet Commonly known as:  TYLENOL Take 2 tablets (650 mg total) by mouth every 4 (four) hours as needed.   amLODipine 2.5 MG tablet Commonly known as:  NORVASC Take 1 tablet (2.5 mg total) by mouth daily.   apixaban 5 MG Tabs tablet Commonly known as:  ELIQUIS Take 1 tablet (5 mg total) by mouth 2 (two) times daily.   atorvastatin 20 MG tablet Commonly known as:  LIPITOR Take 1 tablet (20 mg total) by mouth daily at 6 PM.   carvedilol 25 MG tablet Commonly known as:  COREG TAKE 1 TABLET BY MOUTH TWICE DAILY   denosumab 60 MG/ML Sosy injection Commonly known as:  PROLIA Inject 60 mg into the skin every 6 (six) months.   furosemide 40 MG tablet Commonly known as:   LASIX TAKE ONE TABLET (40mg ) BY MOUTH IN THE MORNING   gabapentin 600 MG tablet Commonly known as:  NEURONTIN Take 1 tablet (600 mg total) by mouth 3 (three) times daily.   lidocaine-prilocaine cream Commonly known as:  EMLA Apply 1 application topically as needed.   nitroGLYCERIN 0.4 MG SL tablet Commonly known as:  NITROSTAT Place 1 tablet (0.4 mg total) under the tongue every 5 (five) minutes as needed. CHEST PAIN   oxyCODONE-acetaminophen 10-325 MG tablet Commonly known as:  PERCOCET Take 1 tablet by mouth every 4 (four) hours as needed. PAIN   pantoprazole 40 MG tablet Commonly known as:  PROTONIX Take 1 tablet (40 mg total) by mouth 2 (two) times daily.   promethazine 25 MG tablet Commonly known as:  PHENERGAN TAKE ONE TABLET BY MOUTH EVERY 8 HOURS AS NEEDED FOR NAUSEA   SPIRIVA RESPIMAT 1.25 MCG/ACT Aers Generic drug:  Tiotropium Bromide Monohydrate INHALE 2 PUFFS INTO THE LUNGS DAILY   tiZANidine 4 MG tablet Commonly known as:  ZANAFLEX Take 1 tablet (4 mg total) by mouth 3 (three) times daily as needed. for muscle spams   zolpidem 5 MG tablet Commonly known as:  AMBIEN 1 po qhs prn       Allergies:  Allergies  Allergen Reactions  . Dabigatran Etexilate Mesylate Other (See Comments)  INTERNAL BLEEDING-pradaxa  . Talwin [Pentazocine] Other (See Comments)    hallucinations  . Lactate Rash    Past Medical History, Surgical history, Social history, and Family History were reviewed and updated.  Review of Systems: All other 10 point review of systems is negative.   Physical Exam:  vitals were not taken for this visit.   Wt Readings from Last 3 Encounters:  02/26/18 90 lb (40.8 kg)  02/04/18 91 lb (41.3 kg)  01/29/18 91 lb 8 oz (41.5 kg)    Ocular: Sclerae unicteric, pupils equal, round and reactive to light Ear-nose-throat: Oropharynx clear, dentition fair Lymphatic: No cervical, supraclavicular or axillary adenopathy Lungs no rales or  rhonchi, good excursion bilaterally Heart regular rate and rhythm, no murmur appreciated Abd soft, nontender, positive bowel sounds, no liver or spleen tip palpated on exam, no fluid wave MSK no focal spinal tenderness, no joint edema Neuro: non-focal, well-oriented, appropriate affect Breasts: Deferred   Lab Results  Component Value Date   WBC 5.3 02/26/2018   HGB 9.5 (L) 02/26/2018   HCT 30.7 (L) 02/26/2018   MCV 97.5 02/26/2018   PLT 208 02/26/2018   Lab Results  Component Value Date   FERRITIN 1,381 (H) 01/29/2018   IRON 60 01/29/2018   TIBC 193 (L) 01/29/2018   UIBC 133 01/29/2018   IRONPCTSAT 31 01/29/2018   Lab Results  Component Value Date   RETICCTPCT 1.4 02/26/2018   RBC 3.15 (L) 02/26/2018   RBC 3.15 (L) 02/26/2018   RETICCTABS 64.6 04/01/2009   No results found for: KPAFRELGTCHN, LAMBDASER, KAPLAMBRATIO No results found for: IGGSERUM, IGA, IGMSERUM No results found for: Ronnald Ramp, A1GS, A2GS, Violet Baldy, MSPIKE, SPEI   Chemistry      Component Value Date/Time   NA 141 01/29/2018 1330   K 4.8 (H) 01/29/2018 1330   CL 99 01/29/2018 1330   CO2 31 01/29/2018 1330   BUN 33 (H) 01/29/2018 1330   CREATININE 1.60 (H) 01/29/2018 1330   CREATININE 0.91 03/16/2016 1539      Component Value Date/Time   CALCIUM 8.8 01/29/2018 1330   ALKPHOS 127 (H) 01/29/2018 1330   AST 28 01/29/2018 1330   ALT 24 01/29/2018 1330   BILITOT 0.7 01/29/2018 1330       Impression and Plan: [name] is a very pleasant 72 yo caucasian female with iron deficiency anemia secondary to malabsorption and possible GI blood loss with Eliquis.  We will see what her iron studies show and bring her back in for infusion if needed.  We will plan to see her back in another 8 weeks.  She will contact our office with any questions or concerns. We can certainly see her sooner if need be.   Laverna Peace, NP 12/11/20191:36 PM

## 2018-02-27 LAB — IRON AND TIBC
Iron: 35 ug/dL — ABNORMAL LOW (ref 41–142)
Saturation Ratios: 19 % — ABNORMAL LOW (ref 21–57)
TIBC: 185 ug/dL — ABNORMAL LOW (ref 236–444)
UIBC: 150 ug/dL (ref 120–384)

## 2018-02-27 LAB — ERYTHROPOIETIN: Erythropoietin: 6.4 m[IU]/mL (ref 2.6–18.5)

## 2018-02-27 LAB — FERRITIN: Ferritin: 904 ng/mL — ABNORMAL HIGH (ref 11–307)

## 2018-03-02 NOTE — Progress Notes (Signed)
HPI She presents for followup of a nonischemic cardiomyopathy and atrial fibrillation. She was in the hospital twice once in March with a TIA.  She had previous atrial fibrillation and we had avoided anticoagulation because she had no recurrence and she had risk for anticoagulants but she was noted to be apparently atrial fibrillation at that time and so the decision was  made for her to start anticoagulant.  She came back in the hospital in April of this year with acute cholecystitis and eventually had to have cholecystectomy.  She had problems with labile blood pressure.  Echocardiogram done in March demonstrated a well-preserved ejection fraction.  She called recently with swelling in her legs.  I sent her for an ultrasound and she had evidence of a Baker's cyst.     She had this as a regular scheduled appointment.  Thankfully her leg has improved.  There is no bruising or swelling.  However, she kept this appointment because she was more short of breath.  She had to walk to a concert with calcium recently and she noted that she had increasing dyspnea and had to stop halfway.  She is having a little bit of chest discomfort which seems to be sporadic.  She is not having any palpitations, presyncope or syncope.  There is no new PND or orthopnea.  She is had no weight gain or edema.   Allergies  Allergen Reactions  . Dabigatran Etexilate Mesylate Other (See Comments)    INTERNAL BLEEDING-pradaxa  . Talwin [Pentazocine] Other (See Comments)    hallucinations  . Lactate Rash    Current Outpatient Medications  Medication Sig Dispense Refill  . acetaminophen (TYLENOL) 325 MG tablet Take 2 tablets (650 mg total) by mouth every 4 (four) hours as needed.    [name] amLODipine (NORVASC) 2.5 MG tablet Take 1 tablet (2.5 mg total) by mouth daily. 90 tablet 3  . apixaban (ELIQUIS) 5 MG TABS tablet Take 1 tablet (5 mg total) by mouth 2 (two) times daily. 180 tablet 1  . atorvastatin (LIPITOR) 20 MG tablet  Take 1 tablet (20 mg total) by mouth daily at 6 PM. 90 tablet 1  . carvedilol (COREG) 25 MG tablet TAKE 1 TABLET BY MOUTH TWICE DAILY 180 tablet 1  . denosumab (PROLIA) 60 MG/ML SOSY injection Inject 60 mg into the skin every 6 (six) months.    . furosemide (LASIX) 40 MG tablet TAKE ONE TABLET (40mg ) BY MOUTH IN THE MORNING 90 tablet 3  . gabapentin (NEURONTIN) 600 MG tablet Take 1 tablet (600 mg total) by mouth 3 (three) times daily.    [name] lidocaine-prilocaine (EMLA) cream Apply 1 application topically as needed. 30 g 6  . nitroGLYCERIN (NITROSTAT) 0.4 MG SL tablet Place 1 tablet (0.4 mg total) under the tongue every 5 (five) minutes as needed. CHEST PAIN 25 tablet 5  . oxyCODONE-acetaminophen (PERCOCET) 10-325 MG tablet Take 1 tablet by mouth every 4 (four) hours as needed. PAIN 120 tablet 0  . pantoprazole (PROTONIX) 40 MG tablet Take 1 tablet (40 mg total) by mouth 2 (two) times daily. 60 tablet 0  . promethazine (PHENERGAN) 25 MG tablet TAKE ONE TABLET BY MOUTH EVERY 8 HOURS AS NEEDED FOR NAUSEA 40 tablet 2  . SPIRIVA RESPIMAT 1.25 MCG/ACT AERS INHALE 2 PUFFS INTO THE LUNGS DAILY 1 Inhaler 5  . tiZANidine (ZANAFLEX) 4 MG tablet Take 1 tablet (4 mg total) by mouth 3 (three) times daily as needed. for muscle spams 90 tablet  2  . zolpidem (AMBIEN) 5 MG tablet 1 po qhs prn 30 tablet 2   No current facility-administered medications for this visit.     Past Medical History:  Diagnosis Date  . Anemia   . Arthritis    right hip  . Atrial fibrillation ([place])   . CAD (coronary artery disease)    mild disease per cath in 2011  . CHF (congestive heart failure) ([place])   . COPD (chronic obstructive pulmonary disease) ([facility])    continuous O2- 2l  . Diverticulitis   . Elbow fracture, left aug 2012  . Fibromyalgia    COUPLE OF TIMES A YEAR-PT HAS EPISODES OF CONFUSION-USUALLY INVOLVES DAY/NIGHT REVERSAL AND EPISODES OF TWITCHING AND FALLS--SEES DR. Jacelyn Grip - NEUROLOGIST-LAST SEEN 07/10/11  .  Gastroparesis   . GERD (gastroesophageal reflux disease)   . H/O: GI bleed    from Pradaxa  . Hepatitis    hx of in high school   . HTN (hypertension)    under control; has been on med. x "years"  . Hx MRSA infection   . Hx of gastric ulcer   . Hx of stress fx aug 2011   right hip   . Iron malabsorption 08/14/2017  . LBBB (left bundle branch block)   . Oxygen dependent    2 liters via nasal cannula at all times  . Pacemaker    CRT therapy; followed by Dr. Caryl Comes  . Pleural effusion     s/p right thoracentesis 03/09  . Pneumonia - 03/09, 12/09, 02/10, 06/10   MOST RECENT FEB 2013  . Primary dilated cardiomyopathy ([facility])    EF 45 to 50% per echo in Jan 2012  . RLS (restless legs syndrome)   . Silent aspiration   . Small bowel obstruction ([place])   . Urinary incontinence     -INTERSTIM IMPLANT NOT FUNCTIONING PER PT  . Venous embolism and thrombosis of subclavian vein ([place])    after pacemaker insertion Oct 2011  . Vitamin B12 deficiency     Past Surgical History:  Procedure Laterality Date  . APPENDECTOMY    . CARDIAC CATHETERIZATION  04/08/2006, 11/16/2009  . CHOLECYSTECTOMY N/A 06/27/2017   Procedure: OPEN CHOLECYSTECTOMY;  Surgeon: Ralene Ok, MD;  Location: [facility];  Service: General;  Laterality: N/A;  . COLECTOMY     Intestinal resection (5times)  . COLECTOMY  02/04/2000   ex. lap., intra-abd. subtotal colectomy with ileosigmoid colon anastomosies and lysis of adhesions  . CYSTOSCOPY  11/11/2008  . CYSTOSCOPY W/ RETROGRADES  11/11/2008   right  . CYSTOSCOPY WITH INJECTION  04/30/2006   transurethral collagen injection; incision vaginal stenosis  . ERCP N/A 06/23/2017   Procedure: ENDOSCOPIC RETROGRADE CHOLANGIOPANCREATOGRAPHY (ERCP);  Surgeon: Carol Ada, MD;  Location: [facility];  Service: Endoscopy;  Laterality: N/A;  . ERCP N/A 06/24/2017   Procedure: ENDOSCOPIC RETROGRADE CHOLANGIOPANCREATOGRAPHY (ERCP);  Surgeon: [name], MD;  Location: [facility];   Service: Endoscopy;  Laterality: N/A;  . ESOPHAGOGASTRODUODENOSCOPY N/A 02/02/2013   Procedure: ESOPHAGOGASTRODUODENOSCOPY (EGD);  Surgeon: Lafayette Dragon, MD;  Location: [facility];  Service: Endoscopy;  Laterality: N/A;  . EXPLORATORY LAPAROTOMY  04/27/2009   lysis of adhesions, gastrostomy tube  . GASTROCUTANEOUS FISTULA CLOSURE    . INTERSTIM IMPLANT PLACEMENT  05/28/2006 - stage I   06/05/2006 - stage II  . INTERSTIM IMPLANT PLACEMENT  02/05/2012   Procedure: [name] IMPLANT FIRST STAGE;  Surgeon: Reece Packer, MD;  Location: [facility];  Service: Urology;  Laterality: N/A;  Replacement of Interstim Lead     . INTERSTIM IMPLANT REVISION  03/06/2011   Procedure: REVISION OF [name];  Surgeon: Reece Packer, MD;  Location: [facility];  Service: Urology;  Laterality: N/A;  Replacement of Neurostimulator  . INTERSTIM IMPLANT REVISION  10/23/2007  . IR IMAGING GUIDED PORT INSERTION  10/14/2017  . PACEMAKER PLACEMENT  12/21/2009  . Peg removed     with complications  . PORT-A-CATH REMOVAL  07/27/2011   Procedure: REMOVAL PORT-A-CATH;  Surgeon: Rolm Bookbinder, MD;  Location: [facility];  Service: General;  Laterality: N/A;  . PORTACATH PLACEMENT  12/16/2009  . SAVORY DILATION N/A 02/02/2013   Procedure: SAVORY DILATION;  Surgeon: Lafayette Dragon, MD;  Location: [facility];  Service: Endoscopy;  Laterality: N/A;  . SMALL INTESTINE SURGERY  05/20/2001   ex. lap., resection of small bowel stricture; gastrostomy; insertion central line  . TONSILLECTOMY  age 89  . TOTAL ABDOMINAL HYSTERECTOMY     complete  . TOTAL HIP ARTHROPLASTY  08/03/2011   Procedure: TOTAL HIP ARTHROPLASTY ANTERIOR APPROACH;  Surgeon: Mcarthur Rossetti, MD;  Location: [facility];  Service: Orthopedics;  Laterality: Right;    ROS:   As stated in the HPI and negative for all other systems.  PHYSICAL EXAM BP 126/68 (BP Location: Right Arm, Patient Position: Sitting, Cuff Size: Normal)   Pulse 63   Ht 5'  (1.524 m)   Wt 90 lb (40.8 kg)   BMI 17.58 kg/m   GENERAL:  Well appearing but slightly frail. NECK:  No jugular venous distention, waveform within normal limits, carotid upstroke brisk and symmetric, no bruits, no thyromegaly LUNGS:  Clear to auscultation bilaterally CHEST:  Unremarkable, porta cath HEART:  PMI not displaced or sustained,S1 and S2 within normal limits, no S3, no S4, no clicks, no rubs, no murmurs ABD:  Flat, positive bowel sounds normal in frequency in pitch, no bruits, no rebound, no guarding, no midline pulsatile mass, no hepatomegaly, no splenomegaly EXT:  2 plus pulses throughout, no edema, no cyanosis no clubbing   EKG: Sinus rhythm, rate 63, ventricular pacing 100% capture  ASSESSMENT AND PLAN   ATRIAL FIBRILLATION -  [name] has a CHA2DS2 - VASc score of 4 with a risk of stroke of 4%.  She seems to be tolerating anticoagulation and maintaining sinus rhythm.  No change in therapy.  CARDIOMYOPATHY, PRIMARY, DILATED -  Her EF is now reported to be 55%.   However, she is having some increasing shortness of breath.  I am getting check a BNP level.  I do not strongly suspect heart failure or an ischemic etiology.  It may be related to her anemia as her hemoglobin is lower and not at her baseline of 10.7.  This is being treated.  I also asked her to follow-up with Dr. Halford Chessman to see if he thinks this might be a primary pulmonary issue.  If there is no forthcoming etiology in her hemoglobin is corrected I might consider ischemia work-up although she did not have obstructive coronary disease in the distant past on catheterization and her pain currently is atypical.  HYPERTENSION -  Blood pressure is at target.  No change in therapy.   ELEVATED CREAT - Creatinine is now back to baseline of 0.81.    LEG SWELLING -   This seems to be improved.  It was a Baker's cyst and there is some residual Baker's cyst and we talked about this.  She may choose  not to have  further therapy unless it causes future.

## 2018-03-03 ENCOUNTER — Inpatient Hospital Stay: Payer: PPO

## 2018-03-03 VITALS — BP 154/62 | HR 95

## 2018-03-03 DIAGNOSIS — D508 Other iron deficiency anemias: Secondary | ICD-10-CM | POA: Diagnosis not present

## 2018-03-03 DIAGNOSIS — D500 Iron deficiency anemia secondary to blood loss (chronic): Secondary | ICD-10-CM

## 2018-03-03 DIAGNOSIS — Z95828 Presence of other vascular implants and grafts: Secondary | ICD-10-CM

## 2018-03-03 MED ORDER — SODIUM CHLORIDE 0.9 % IV SOLN
INTRAVENOUS | Status: DC
  Administered 2018-03-03: 13:00:00 via INTRAVENOUS
  Filled 2018-03-03: qty 250

## 2018-03-03 MED ORDER — SODIUM CHLORIDE 0.9 % IV SOLN
510.0000 mg | Freq: Once | INTRAVENOUS | Status: AC
  Administered 2018-03-03: 510 mg via INTRAVENOUS
  Filled 2018-03-03: qty 17

## 2018-03-03 NOTE — Patient Instructions (Signed)

## 2018-03-04 ENCOUNTER — Encounter: Payer: Self-pay | Admitting: Cardiology

## 2018-03-04 ENCOUNTER — Ambulatory Visit: Payer: PPO | Admitting: Cardiology

## 2018-03-04 VITALS — BP 126/68 | HR 63 | Ht 60.0 in | Wt 90.0 lb

## 2018-03-04 DIAGNOSIS — M660 Rupture of popliteal cyst: Secondary | ICD-10-CM

## 2018-03-04 DIAGNOSIS — I480 Paroxysmal atrial fibrillation: Secondary | ICD-10-CM

## 2018-03-04 DIAGNOSIS — I5032 Chronic diastolic (congestive) heart failure: Secondary | ICD-10-CM | POA: Diagnosis not present

## 2018-03-04 DIAGNOSIS — I10 Essential (primary) hypertension: Secondary | ICD-10-CM

## 2018-03-04 NOTE — Patient Instructions (Signed)
Medication Instructions:  Your physician recommends that you continue on your current medications as directed. Please refer to the Current Medication list given to you today.  If you need a refill on your cardiac medications before your next appointment, please call your pharmacy.   Lab work: Please have a BNP lab drawn at [facility].  If you have labs (blood work) drawn today and your tests are completely normal, you will receive your results only by: [name] MyChart Message (if you have MyChart) OR . A paper copy in the mail If you have any lab test that is abnormal or we need to change your treatment, we will call you to review the results.  Testing/Procedures: None ordered  Follow-Up: At [facility], you and your health needs are our priority.  As part of our continuing mission to provide you with exceptional heart care, we have created designated Provider Care Teams.  These Care Teams include your primary Cardiologist (physician) and Advanced Practice Providers (APPs -  Physician Assistants and Nurse Practitioners) who all work together to provide you with the care you need, when you need it. You will need a follow up appointment in 3 months.   You may see  Dr.Hochrein or one of the following Advanced Practice Providers on your designated Care Team:   Rosaria Ferries, PA-C . Jory Sims, DNP, ANP  Any Other Special Instructions Will Be Listed Below (If Applicable).

## 2018-03-06 ENCOUNTER — Telehealth: Payer: Self-pay | Admitting: *Deleted

## 2018-03-06 ENCOUNTER — Telehealth: Payer: Self-pay | Admitting: Cardiology

## 2018-03-06 ENCOUNTER — Other Ambulatory Visit: Payer: Self-pay | Admitting: *Deleted

## 2018-03-06 ENCOUNTER — Other Ambulatory Visit (HOSPITAL_COMMUNITY)
Admission: RE | Admit: 2018-03-06 | Discharge: 2018-03-06 | Disposition: A | Discharge status: Home / Self Care | Payer: PPO | Source: Ambulatory Visit | Attending: Cardiology | Admitting: Cardiology

## 2018-03-06 DIAGNOSIS — I5032 Chronic diastolic (congestive) heart failure: Secondary | ICD-10-CM | POA: Insufficient documentation

## 2018-03-06 LAB — BRAIN NATRIURETIC PEPTIDE: B Natriuretic Peptide: 180.8 pg/mL — ABNORMAL HIGH (ref 0.0–100.0)

## 2018-03-06 MED ORDER — HEPARIN SOD (PORK) LOCK FLUSH 100 UNIT/ML IV SOLN
500.0000 [IU] | INTRAVENOUS | Status: AC | PRN
  Administered 2018-03-06: 500 [IU]

## 2018-03-06 NOTE — Telephone Encounter (Signed)
New Message      [name] at spoke with nurse and she is waiting on a fax for an order for BMP . Pt is waiting . Pease fax to [phone]

## 2018-03-06 NOTE — Telephone Encounter (Signed)
Received call from lab at hospital need order for BNP, entered order and will fax to [phone]

## 2018-03-06 NOTE — Telephone Encounter (Signed)
Lab orders faxed and confirmed received

## 2018-03-12 DIAGNOSIS — I4891 Unspecified atrial fibrillation: Secondary | ICD-10-CM | POA: Diagnosis not present

## 2018-03-12 DIAGNOSIS — R05 Cough: Secondary | ICD-10-CM | POA: Diagnosis not present

## 2018-03-12 DIAGNOSIS — J449 Chronic obstructive pulmonary disease, unspecified: Secondary | ICD-10-CM | POA: Diagnosis not present

## 2018-03-13 ENCOUNTER — Other Ambulatory Visit: Payer: Self-pay | Admitting: Family Medicine

## 2018-03-13 DIAGNOSIS — G4700 Insomnia, unspecified: Secondary | ICD-10-CM

## 2018-03-14 NOTE — Telephone Encounter (Signed)
Database ran and is on your desk for review.  Last filled per database: 02/12/18 Last written: 12/09/17 Last ov: 12/09/17 Next ov: 06/08/17 Contract: need, will get at 3/23 appt UDS: need, will get at 3/23 appt

## 2018-03-18 ENCOUNTER — Other Ambulatory Visit: Payer: Self-pay | Admitting: Family Medicine

## 2018-03-18 DIAGNOSIS — R52 Pain, unspecified: Secondary | ICD-10-CM

## 2018-03-20 LAB — CUP PACEART REMOTE DEVICE CHECK
Battery Remaining Longevity: 15 mo
Battery Voltage: 2.87 V
Brady Statistic AP VP Percent: 3.18 %
Brady Statistic AP VS Percent: 0.01 %
Brady Statistic AS VP Percent: 96.76 %
Brady Statistic AS VS Percent: 0.06 %
Brady Statistic RA Percent Paced: 3.18 %
Brady Statistic RV Percent Paced: 99.92 %
Date Time Interrogation Session: 20191105023757
Implantable Lead Implant Date: 20111005
Implantable Lead Implant Date: 20111005
Implantable Lead Implant Date: 20111005
Implantable Lead Location: 753858
Implantable Lead Location: 753859
Implantable Lead Location: 753860
Implantable Lead Model: 4196
Implantable Lead Model: 5076
Implantable Lead Model: 5076
Implantable Pulse Generator Implant Date: 20111005
Lead Channel Impedance Value: 285 Ohm
Lead Channel Impedance Value: 323 Ohm
Lead Channel Impedance Value: 361 Ohm
Lead Channel Impedance Value: 380 Ohm
Lead Channel Impedance Value: 418 Ohm
Lead Channel Impedance Value: 475 Ohm
Lead Channel Impedance Value: 475 Ohm
Lead Channel Impedance Value: 532 Ohm
Lead Channel Impedance Value: 570 Ohm
Lead Channel Pacing Threshold Amplitude: 0.5 V
Lead Channel Pacing Threshold Amplitude: 0.5 V
Lead Channel Pacing Threshold Pulse Width: 0.4 ms
Lead Channel Pacing Threshold Pulse Width: 0.4 ms
Lead Channel Sensing Intrinsic Amplitude: 0.5 mV
Lead Channel Sensing Intrinsic Amplitude: 0.5 mV
Lead Channel Sensing Intrinsic Amplitude: 8 mV
Lead Channel Sensing Intrinsic Amplitude: 8 mV
Lead Channel Setting Pacing Amplitude: 1.25 V
Lead Channel Setting Pacing Amplitude: 2 V
Lead Channel Setting Pacing Amplitude: 2.5 V
Lead Channel Setting Pacing Pulse Width: 0.4 ms
Lead Channel Setting Pacing Pulse Width: 0.8 ms
Lead Channel Setting Sensing Sensitivity: 0.9 mV

## 2018-03-21 ENCOUNTER — Other Ambulatory Visit: Payer: Self-pay | Admitting: Family Medicine

## 2018-03-21 DIAGNOSIS — R52 Pain, unspecified: Secondary | ICD-10-CM

## 2018-03-21 MED ORDER — OXYCODONE-ACETAMINOPHEN 10-325 MG PO TABS: 1.0000 | ORAL_TABLET | ORAL | 0 refills | Status: DC | PRN

## 2018-03-21 NOTE — Telephone Encounter (Signed)
Database ran on 03/14/18 and has been sent to scan, no discrepancies.  Last written: 02/18/18 Last ov: 12/09/17 Next ov: 06/08/17 Contract: need, will get at 3/23 appt UDS: need, will get at 3/23 appt

## 2018-03-21 NOTE — Telephone Encounter (Signed)
Copied from [place] [id number]. Topic: Quick Communication - Rx Refill/Question >> Mar 21, 2018 10:11 AM Yvette Rack wrote: Medication: oxyCODONE-acetaminophen (PERCOCET) 10-325 MG tablet  Has the patient contacted their pharmacy? no  Preferred Pharmacy (with phone number or street name): [facility], [place] [phone] (Phone)  [phone] (Fax)  Agent: Please be advised that RX refills may take up to 3 business days. We ask that you follow-up with your pharmacy.

## 2018-03-24 ENCOUNTER — Encounter: Payer: Self-pay | Admitting: Pulmonary Disease

## 2018-03-24 ENCOUNTER — Ambulatory Visit: Payer: Self-pay | Admitting: Pulmonary Disease

## 2018-03-24 VITALS — BP 128/74 | HR 88 | Ht 60.0 in | Wt 89.4 lb

## 2018-03-24 DIAGNOSIS — J479 Bronchiectasis, uncomplicated: Secondary | ICD-10-CM

## 2018-03-24 DIAGNOSIS — J189 Pneumonia, unspecified organism: Secondary | ICD-10-CM

## 2018-03-24 DIAGNOSIS — J432 Centrilobular emphysema: Secondary | ICD-10-CM

## 2018-03-24 DIAGNOSIS — J9611 Chronic respiratory failure with hypoxia: Secondary | ICD-10-CM

## 2018-03-24 NOTE — Progress Notes (Signed)
[facility]  Chief Complaint  Patient presents with  . Follow-up    pt reports of sob with exertion, dry cough & nasal drainage clear in color. on 3L cont    Constitutional:  BP 128/74 (BP Location: Left Arm, Cuff Size: Normal)   Pulse 88   Ht 5' (1.524 m)   Wt 89 lb 6.4 oz (40.6 kg)   SpO2 98%   BMI 17.46 kg/m   Past Medical History:  B12 deficiency, SBO, GERD, Silent Aspiration, RLS, dilated CM, LBBB, Malabsorption, HTN, Gastroparesis, Fibromyalgia, Diverticulitis, CAD, A fib  Brief Summary:  [name] is a 73 y.o. female former smoker with COPD/emphysema, chronic hypoxic respiratory failure, and recurrent pneumonia with focal bronchiectasis.  She notices getting more winded with activity.  She recovers after resting for a few minutes.  Has her usual cough with sputum.  Not having chest pain, fever, wheeze, or leg swelling.  Using 3 liters oxygen.  Feels spiriva is working well.  She maintained her SpO2 above 90% while walking on 3 liters oxygen in office today.  Physical Exam:   Appearance - thin, wearing oxygen  ENMT - clear nasal mucosa, midline nasal  septum, no oral exudates, no LAN, trachea midline  Respiratory - normal chest wall, normal respiratory effort, no accessory muscle use, no wheeze/rales, decreased breath sounds  CV - s1s2 regular rate and rhythm, no murmurs, no peripheral edema, radial pulses symmetric  GI - soft, non tender, no masses  Lymph - no adenopathy noted in neck and axillary areas  MSK - normal gait  Ext - no cyanosis, clubbing, or joint inflammation noted  Skin - changes of psoriasis  Neuro - normal strength, oriented x 3  Psych - normal mood and affect  Discussion:  She has progressive dyspnea on exertion.  This is likely related to deconditioning in the setting of COPD and cardiomyopathy.  Her other symptoms are relatively stable.  Assessment/Plan:   Bronchiectasis. - prn flutter  valve, mucinex  COPD with emphysema. - continue spiriva  Chronic respiratory failure with hypoxia. - continue 3 liters 24/7 - will arrange for pulse oximeter to use at home  Plaque psoriasis. - advised her to d/w Dr. Renda Rolls - concerned how biologic agents might impact her risk of pneumonia; she had more frequent infections when on MTX   Patient Instructions  Will arrange for portable pulse oximeter  Goal oxygen level is 90% or higher  Follow up in 6 months  Time spent 26 minutes  Chesley Mires, MD [facility] [facility] Pager: [phone] 03/24/2018, 10:04 AM  Flow Sheet     Pulmonary tests:  PFT4/12/10>>FEV1 1.95 (97%), FEV1% 70, TLC 3.88 (87%), DLCO 64% ONO 02/11/10 >> Test time 9 hrs 9 min, Mean SpO2 87%, low SpO2 80%, Spent 5hrs 57 min (65%) with SpO2 < 88%. PFT7/24/13>> FEV1 1.15 (59%), FEV1% 64, TLC 3.26 (73%), DLCO 35%, + BD CT chest 04/30/2011 >> Chronic multifocal nodular and patchy airspace densities throughout the right lung with mild BTX CT chest 03/28/17 >> mild/mod cylindrical BTX diffuse and b/l, tree in bud, nodular infiltrates  Sleep tests:    Cardiac tests:  Echo 05/18/14 >> EF 60 to 40%, grade 1 diastolic dysfx, PAS 30 mmHg   Review of Systems:    Medications:   Allergies as of 03/24/2018      Reactions   Dabigatran Etexilate Mesylate Other (See Comments)   INTERNAL BLEEDING-pradaxa   Talwin [pentazocine] Other (See Comments)  hallucinations   Lactate Rash      Medication List       Accurate as of March 24, 2018 10:04 AM. Always use your most recent med list.        acetaminophen 325 MG tablet Commonly known as:  TYLENOL Take 2 tablets (650 mg total) by mouth every 4 (four) hours as needed.   amLODipine 2.5 MG tablet Commonly known as:  NORVASC Take 1 tablet (2.5 mg total) by mouth daily.   apixaban 5 MG Tabs tablet Commonly known as:  ELIQUIS Take 1 tablet (5 mg total) by mouth 2 (two) times daily.     atorvastatin 20 MG tablet Commonly known as:  LIPITOR Take 1 tablet (20 mg total) by mouth daily at 6 PM.   carvedilol 25 MG tablet Commonly known as:  COREG TAKE 1 TABLET BY MOUTH TWICE DAILY   denosumab 60 MG/ML Sosy injection Commonly known as:  PROLIA Inject 60 mg into the skin every 6 (six) months.   furosemide 40 MG tablet Commonly known as:  LASIX TAKE ONE TABLET (40mg ) BY MOUTH IN THE MORNING   gabapentin 600 MG tablet Commonly known as:  NEURONTIN TAKE 1 TABLET BY MOUTH 4 TIMES DAILY   lidocaine-prilocaine cream Commonly known as:  EMLA Apply 1 application topically as needed.   nitroGLYCERIN 0.4 MG SL tablet Commonly known as:  NITROSTAT Place 1 tablet (0.4 mg total) under the tongue every 5 (five) minutes as needed. CHEST PAIN   oxyCODONE-acetaminophen 10-325 MG tablet Commonly known as:  PERCOCET Take 1 tablet by mouth every 4 (four) hours as needed. PAIN   pantoprazole 40 MG tablet Commonly known as:  PROTONIX Take 1 tablet (40 mg total) by mouth 2 (two) times daily.   promethazine 25 MG tablet Commonly known as:  PHENERGAN TAKE ONE TABLET BY MOUTH EVERY 8 HOURS AS NEEDED FOR NAUSEA   SPIRIVA RESPIMAT 1.25 MCG/ACT Aers Generic drug:  Tiotropium Bromide Monohydrate INHALE 2 PUFFS INTO THE LUNGS DAILY   tiZANidine 4 MG tablet Commonly known as:  ZANAFLEX Take 1 tablet (4 mg total) by mouth 3 (three) times daily as needed. for muscle spams   zolpidem 5 MG tablet Commonly known as:  AMBIEN TAKE 1 TABLET BY MOUTH EVERY DAY AT BEDTIME AS NEEDED       Past Surgical History:  She  has a past surgical history that includes Appendectomy; pacemaker placement (12/21/2009); Peg removed; Tonsillectomy (age 29); Interstim implant revision (03/06/2011); Portacath placement (12/16/2009); Total abdominal hysterectomy; Interstim Implant placement (05/28/2006 - stage I); Interstim implant revision (10/23/2007); Cystoscopy with injection (04/30/2006); Small intestine  surgery (05/20/2001); Cardiac catheterization (04/08/2006, 11/16/2009); Cystoscopy (11/11/2008); Cystoscopy w/ retrogrades (11/11/2008); Colectomy; Colectomy (02/04/2000); Gastrocutaneous fistula closure; Exploratory laparotomy (04/27/2009); Port-a-cath removal (07/27/2011); Total hip arthroplasty (08/03/2011); Interstim Implant placement (02/05/2012); Esophagogastroduodenoscopy (N/A, 02/02/2013); Savory dilation (N/A, 02/02/2013); ERCP (N/A, 06/23/2017); ERCP (N/A, 06/24/2017); Cholecystectomy (N/A, 06/27/2017); and IR IMAGING GUIDED PORT INSERTION (10/14/2017).  Family History:  Her family history includes Breast cancer in her maternal aunt; Diabetes in her paternal grandfather; Heart disease in her brother, father, mother, and paternal aunt; Stroke in her mother.  Social History:  She  reports that she quit smoking about 32 years ago. She has a 20.00 pack-year smoking history. She has never used smokeless tobacco. She reports that she does not drink alcohol or use drugs.

## 2018-03-24 NOTE — Patient Instructions (Signed)
Will arrange for portable pulse oximeter  Goal oxygen level is 90% or higher  Follow up in 6 months

## 2018-03-28 IMAGING — CT CT HEAD W/O CM
3 series · 14 of 45 positions shown, 16 images · non-contrast
Comparison: 06/04/2011 CT head

CLINICAL DATA: 71 y/o F; 2 days of right-sided weakness. Slurred
speech and confusion.

EXAM:
CT HEAD WITHOUT CONTRAST
TECHNIQUE: Contiguous axial images were obtained from the base of the skull
through the vertex without intravenous contrast.

[Series 2: head wo · axial · 0.40mm/px · z∈[+1080,+1196]mm · 8 of 28 slices shown, 10 images]
[im 3/28  brain]
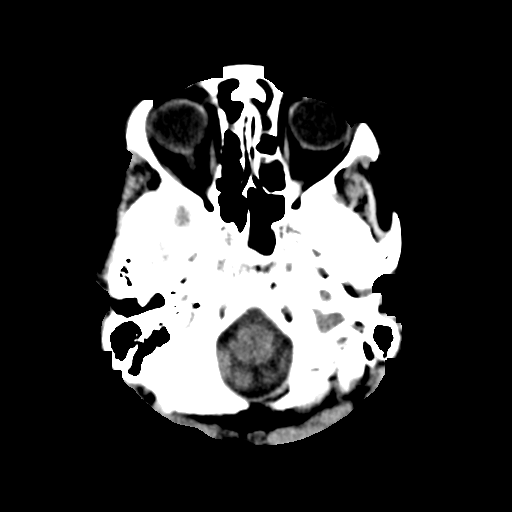
[im 3/28  bone]
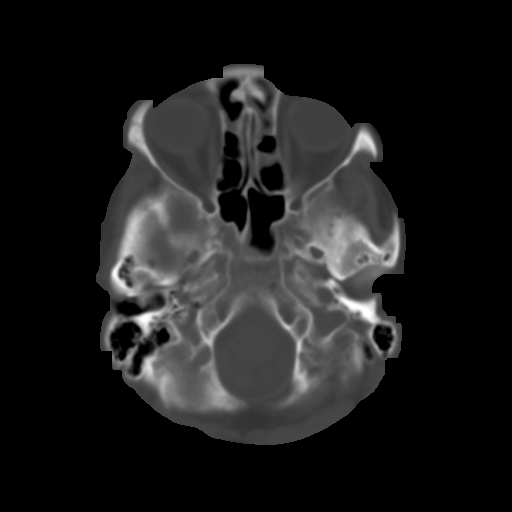
[im 6/28  brain]
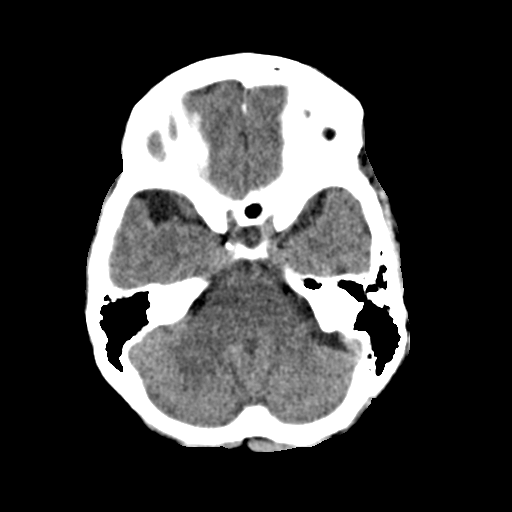
[im 10/28  brain]
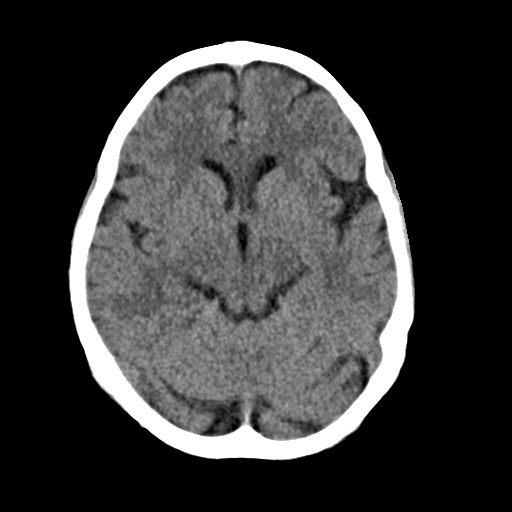
[im 13/28  brain]
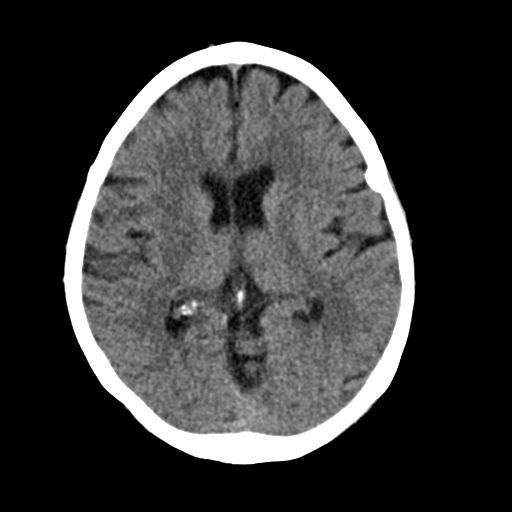
[im 16/28  brain]
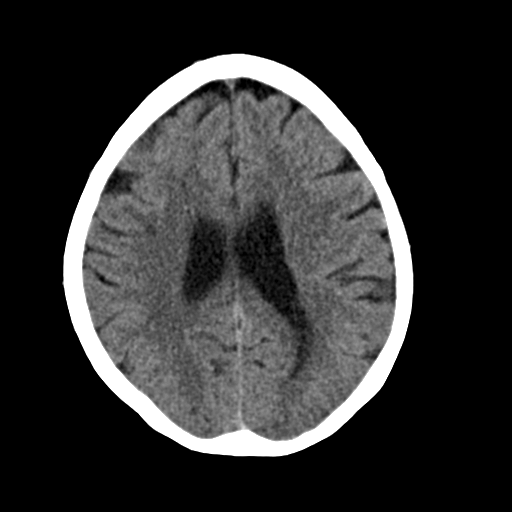
[im 16/28  bone]
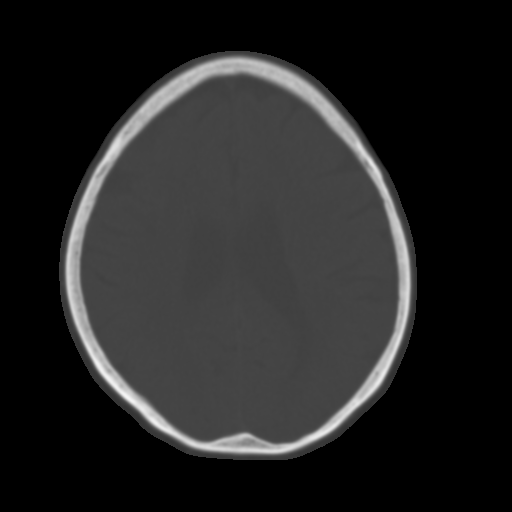
[im 19/28  brain]
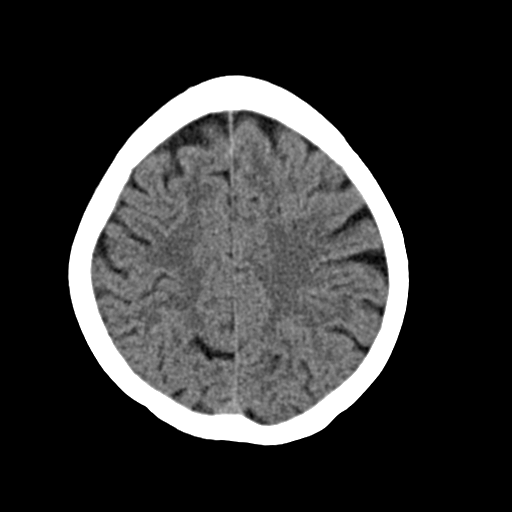
[im 23/28  brain]
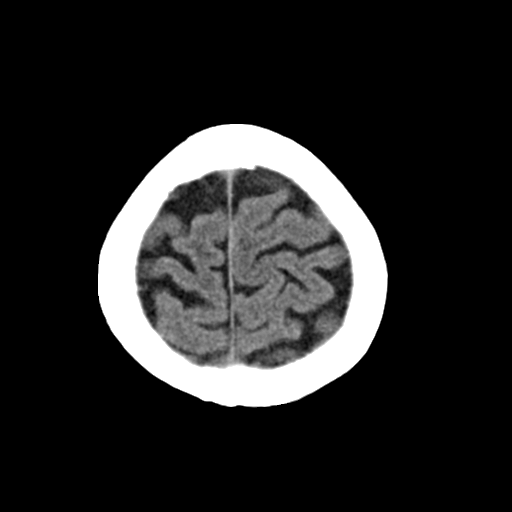
[im 26/28  brain]
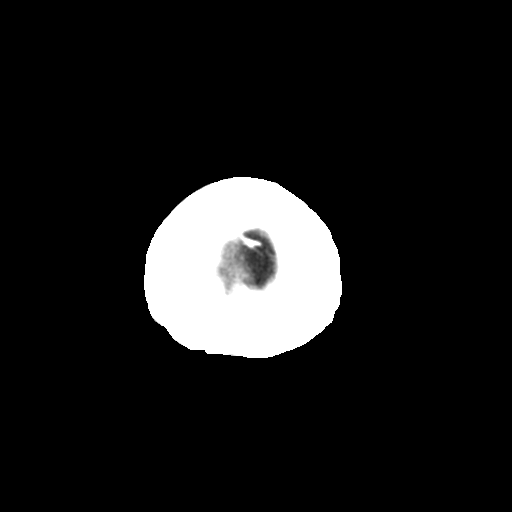

[Series 4: coronal soft · coronal · 0.27mm/px · 3 of 67 slices shown]
[im 23/67  brain]
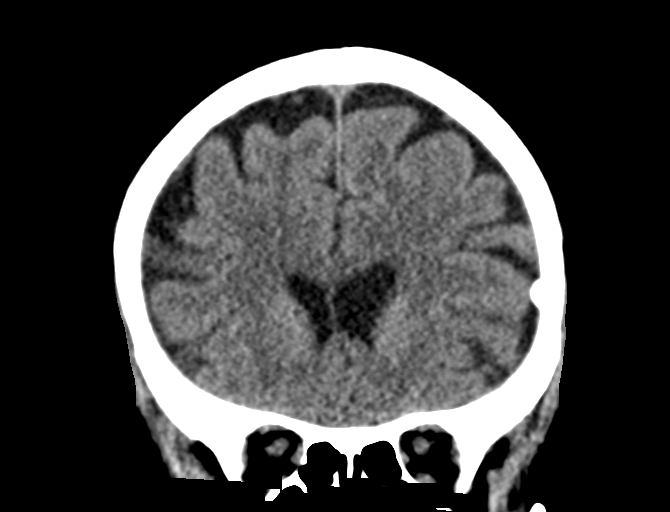
[im 30/67  brain]
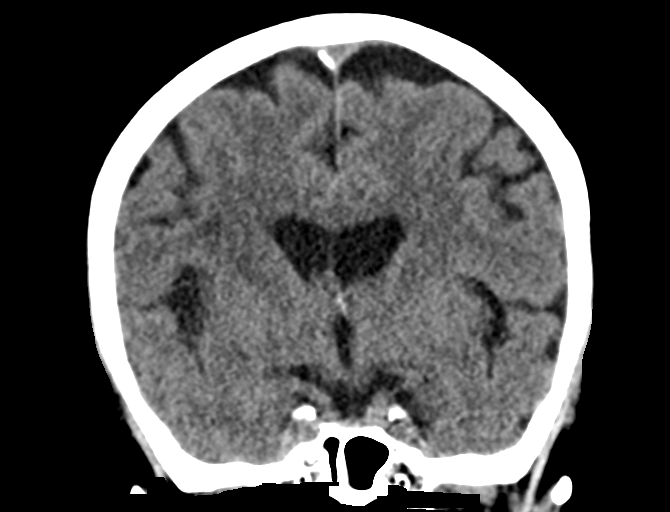
[im 37/67  brain]
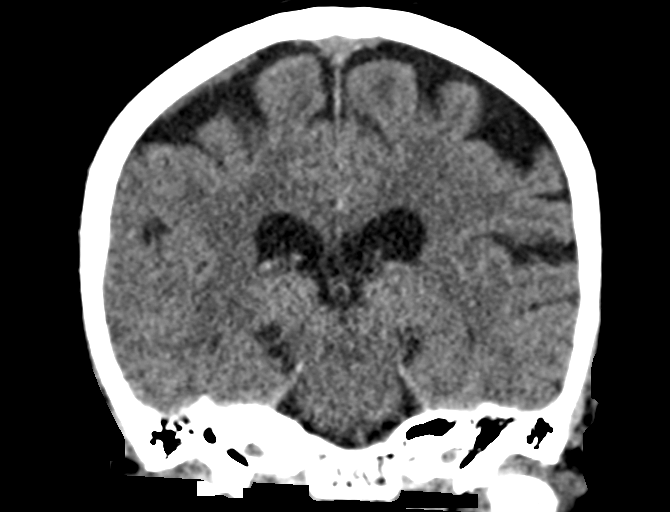

[Series 5: sag soft · sagittal · 0.28mm/px · 3 of 59 slices shown]
[im 20/59  brain]
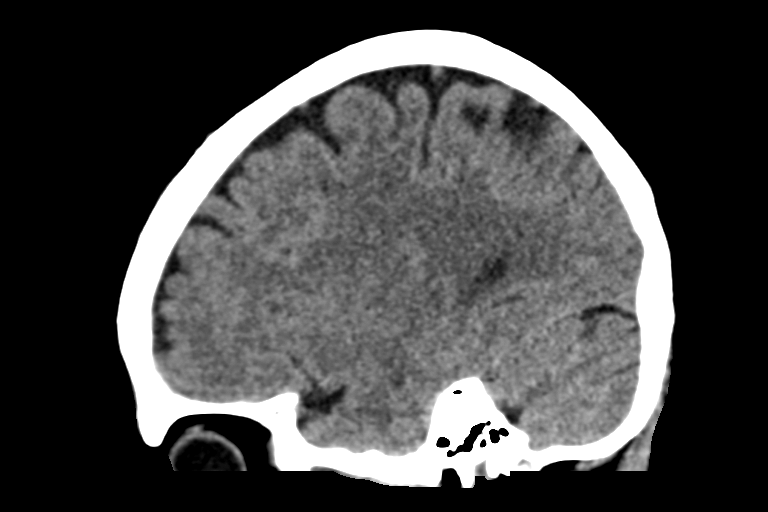
[im 30/59  brain]
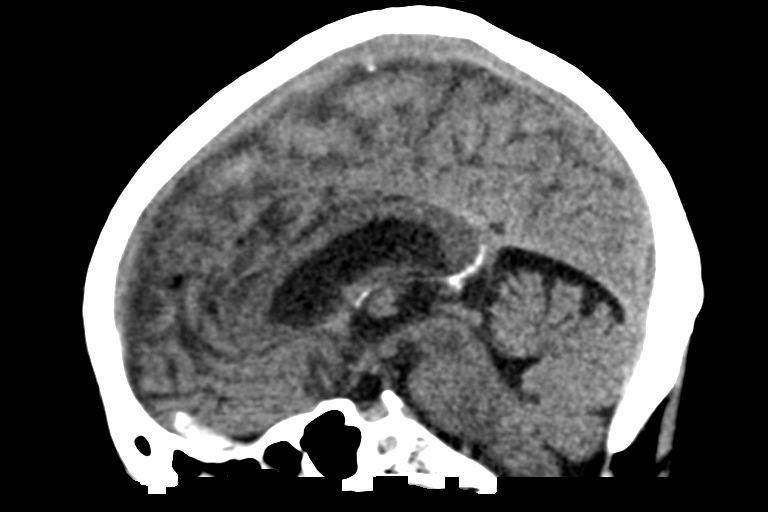
[im 39/59  brain]
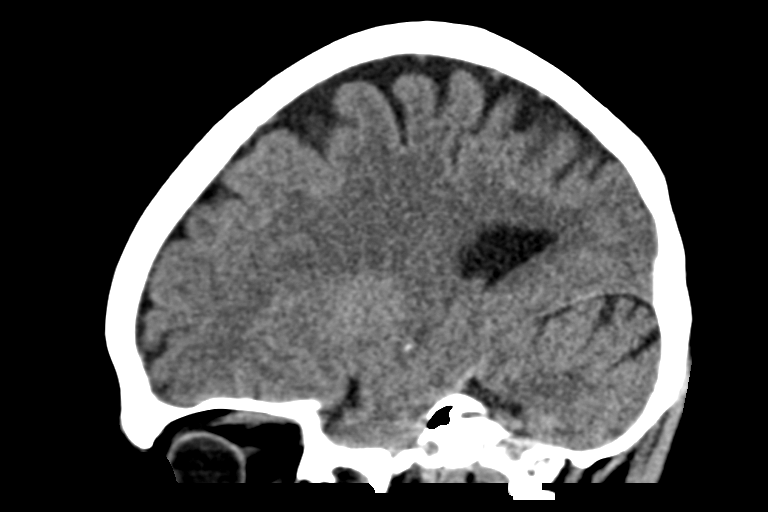

[14 of 45 positions shown; findings below may reference images not displayed]

FINDINGS: Brain: No evidence of acute infarction, hemorrhage, hydrocephalus,
extra-axial collection or mass lesion/mass effect. Few stable
nonspecific foci of hypoattenuation are present in subcortical white
matter compatible mild chronic microvascular ischemic changes.

Vascular: Mild calcific atherosclerosis of carotid siphons. No
hyperdense vessel identified.

Skull: Normal. Negative for fracture or focal lesion.

Sinuses/Orbits: Mild paranasal sinus mucosal thickening. Normal
aeration of mastoid air cells. Bilateral intra-ocular lens
replacement.

Other: None.
IMPRESSION: 1. No acute intracranial abnormality identified.
2. Stable mild chronic microvascular ischemic changes.
3. Mild paranasal sinus disease.

By: Blessing Rizo M.D.

## 2018-03-29 IMAGING — CT CT ANGIO HEAD
1 of 12 series · 4 of 33 positions shown · IV contrast (OMNI 350)
Comparison: 05/30/2017 CT head.

CLINICAL DATA: 71 y/o F; TIA workup, patient presented with slurred
speech and right arm weakness/numbness.

EXAM:
CT ANGIOGRAPHY HEAD AND NECK
TECHNIQUE: Multidetector CT imaging of the head and neck was performed using
the standard protocol during bolus administration of intravenous
contrast. Multiplanar CT image reconstructions and MIPs were
obtained to evaluate the vascular anatomy. Carotid stenosis
measurements (when applicable) are obtained utilizing NASCET
criteria, using the distal internal carotid diameter as the
denominator.
CONTRAST:  50mL [id number] IOPAMIDOL ([id number]) INJECTION 76%

[Series 7: cta neck axial · axial · 0.39mm/px · z∈[-276,-92]mm · 4 of 306 slices shown]
[im 62/306  soft-tissue]
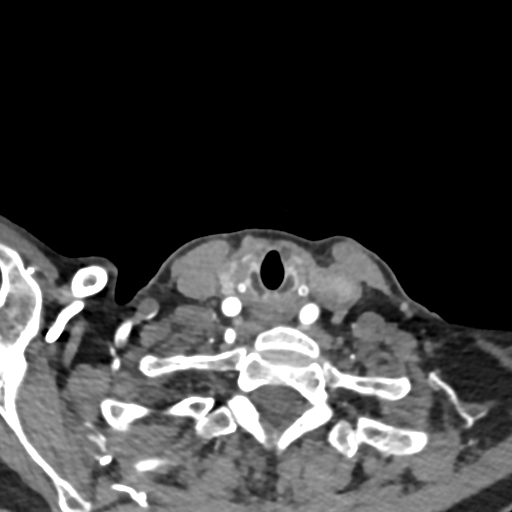
[im 123/306  bone]
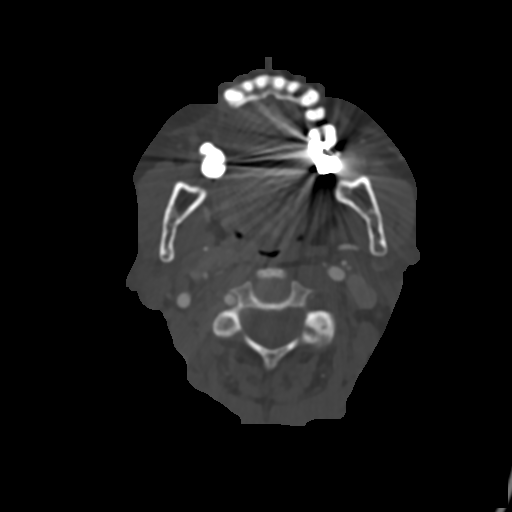
[im 184/306  soft-tissue]
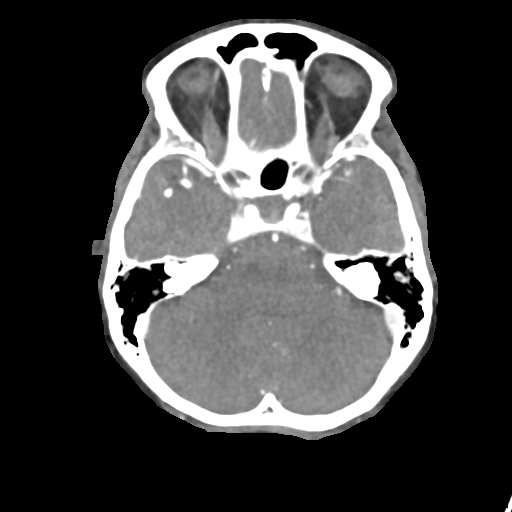
[im 245/306  bone]
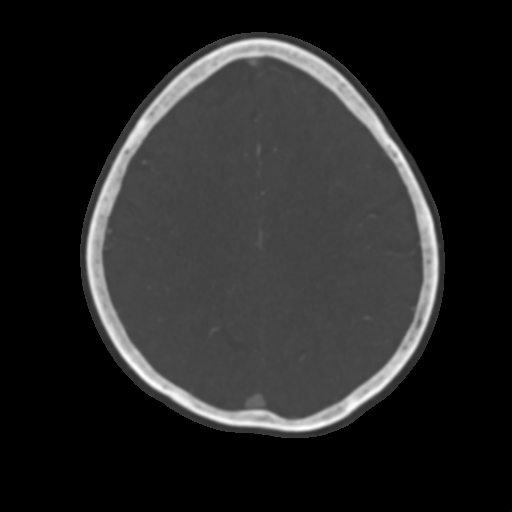

[4 of 33 positions shown; findings below may reference images not displayed]

FINDINGS: CT HEAD FINDINGS

Brain: No acute stroke, hemorrhage, or focal mass effect. No
hydrocephalus, extra-axial collection, or effacement of basilar
cisterns. Stable chronic microvascular ischemic changes and
parenchymal volume loss of the brain. No abnormal enhancement. 9 x 5
x 7 mm left frontal calcified dural nodule (series 9, image 134 and
series 5, image 41), dural plaque or meningioma.

Skull: No acute abnormality.

Sinus and orbits: Mild diffuse paranasal sinus mucosal thickening.
Normal aeration of mastoid air cells. Orbits are unremarkable.

Other: Negative.

CTA NECK FINDINGS

Aortic arch: Standard branching. Imaged portion shows no evidence of
aneurysm or dissection. No significant stenosis of the major arch
vessel origins. Mild calcific atherosclerosis.

Right carotid system: No evidence of dissection, stenosis (50% or
greater) or occlusion. Mild non stenotic calcific atherosclerosis of
right carotid bifurcation.

Left carotid system: No evidence of dissection, stenosis (50% or
greater) or occlusion. Mild non stenotic calcific atherosclerosis of
left carotid bifurcation.

Vertebral arteries: Widely patent right vertebral artery. Occluded
left V1 and V2 segments to the C3 level. Downstream left vertebral
artery to the basilar junction is patent.

Skeleton: Moderate cervical spondylosis with disc and facet
degenerative changes greatest at the C4-C6 levels. No high-grade
bony canal stenosis.

Other neck: Mildly enlarged right upper paratracheal lymph nodes,
likely reactive.

Upper chest: Numerous tiny tree-in-bud nodules enlarged clustered
nodules in bronchovascular distribution, greatest on the right lung
apex, compatible with chronic bronchiolitis.

Review of the MIP images confirms the above findings

CTA HEAD FINDINGS

Anterior circulation: No significant stenosis, proximal occlusion,
aneurysm, or vascular malformation.

Posterior circulation: No significant stenosis, proximal occlusion,
aneurysm, or vascular malformation.

Venous sinuses: As permitted by contrast timing, patent.

Anatomic variants: Bilateral fetal PCA.

Delayed phase: No abnormal enhancement.

Review of the MIP images confirms the above findings
IMPRESSION: CT head:

1. No acute intracranial abnormality identified.
2. Stable chronic microvascular ischemic changes and parenchymal
volume loss of the brain.
3. Subcentimeter left frontal dural calcification may be a dural
plaque or tiny meningioma.

CTA neck:

1. Occluded left vertebral artery from origin to C3 level, age
indeterminate. Patent diminutive downstream left vertebral artery to
the vertebrobasilar junction.
2. Otherwise patent bilateral carotid systems and dominant right
vertebral artery. No dissection, aneurysm, or significant stenosis
by NASCET criteria is identified.
3. Moderate spondylosis of the cervical spine.
4. Findings of chronic bronchiolitis in lung apices, probably
atypical mycobacterium/ABSOLON.

CTA head:

Patent anterior and posterior intracranial circulation. No large
vessel occlusion, aneurysm, or significant stenosis.

By: Isaxan Gopodze M.D.

## 2018-04-01 ENCOUNTER — Other Ambulatory Visit: Payer: Self-pay | Admitting: Cardiology

## 2018-04-11 ENCOUNTER — Telehealth: Payer: Self-pay | Admitting: *Deleted

## 2018-04-11 NOTE — Telephone Encounter (Signed)
-----   Message from Ann Held, DO sent at 04/10/2018  5:18 PM EST ----- Regarding: RE: Prolia She stopped seeing the dr that was ordering her bmd etc--- so please ask pt if she is still taking the prolia  ----- Message ----- From: Damita Dunnings, [place]: 04/01/2018   3:11 PM EST To: Ann Held, DO Subject: Prolia                                         Are we still doing Prolia on this patient? Or okay to d/c?

## 2018-04-12 DIAGNOSIS — I4891 Unspecified atrial fibrillation: Secondary | ICD-10-CM | POA: Diagnosis not present

## 2018-04-12 DIAGNOSIS — R05 Cough: Secondary | ICD-10-CM | POA: Diagnosis not present

## 2018-04-12 DIAGNOSIS — J449 Chronic obstructive pulmonary disease, unspecified: Secondary | ICD-10-CM | POA: Diagnosis not present

## 2018-04-16 ENCOUNTER — Other Ambulatory Visit: Payer: Self-pay | Admitting: Family Medicine

## 2018-04-16 DIAGNOSIS — R52 Pain, unspecified: Secondary | ICD-10-CM

## 2018-04-16 DIAGNOSIS — G4700 Insomnia, unspecified: Secondary | ICD-10-CM

## 2018-04-16 NOTE — Telephone Encounter (Signed)
Medication not delegated for NT to refill. 

## 2018-04-16 NOTE — Telephone Encounter (Signed)
Copied from [place] [id number]. Topic: Quick Communication - Rx Refill/Question >> Apr 16, 2018 11:42 AM [name] wrote: Medication: oxyCODONE-acetaminophen (PERCOCET) 10-325 MG tablet   Patient is requesting a refill of this medication.  Preferred Pharmacy (with phone number or street name):[facility], [place] [address]  [phone] (Phone) [phone] (Fax)

## 2018-04-17 IMAGING — CT CT HEAD W/O CM
4 series · 16 of 47 positions shown, 18 images · non-contrast
Comparison: 05/31/2017, 06/04/2011

CLINICAL DATA: Headache, trouble walking weakness

EXAM:
CT HEAD WITHOUT CONTRAST
TECHNIQUE: Contiguous axial images were obtained from the base of the skull
through the vertex without intravenous contrast.

[Series 3: head without · axial · non-contrast · 0.38mm/px · z∈[-85,+25]mm · 7 of 30 slices shown, 9 images]
[im 4/30  brain]
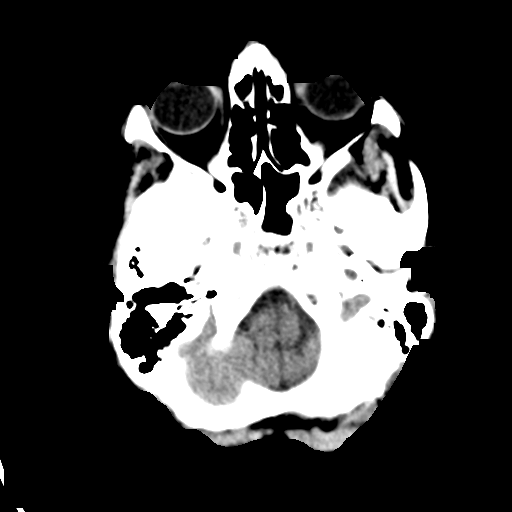
[im 4/30  bone]
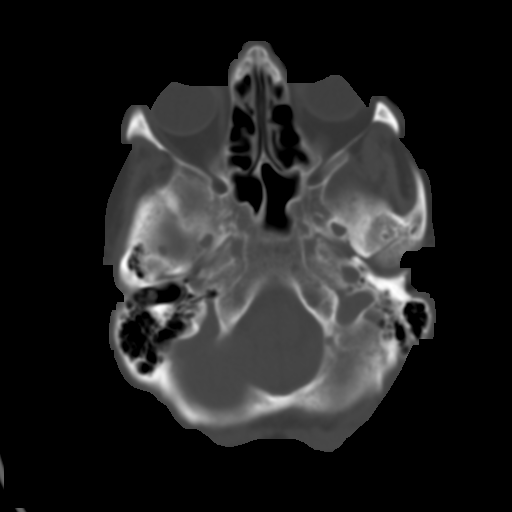
[im 8/30  brain]
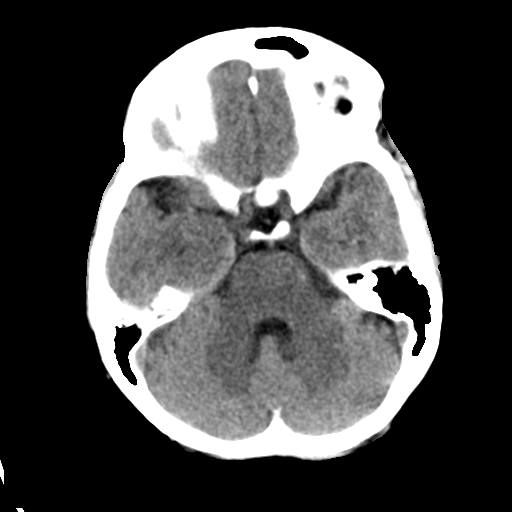
[im 11/30  brain]
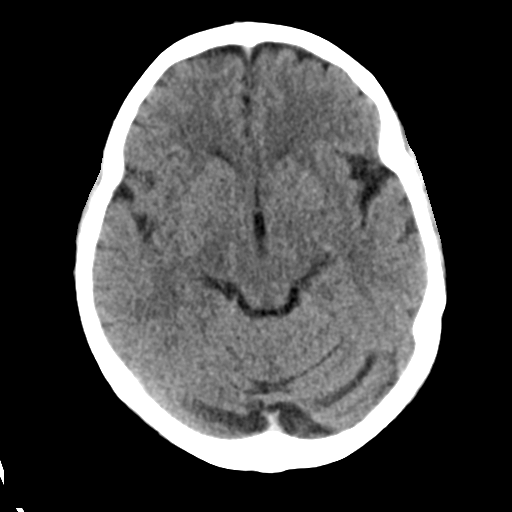
[im 15/30  brain]
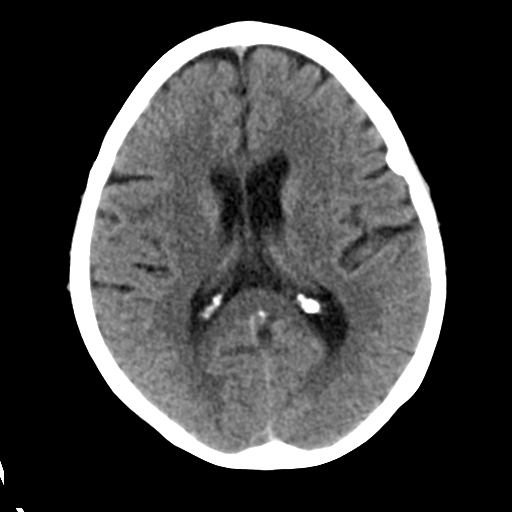
[im 19/30  brain]
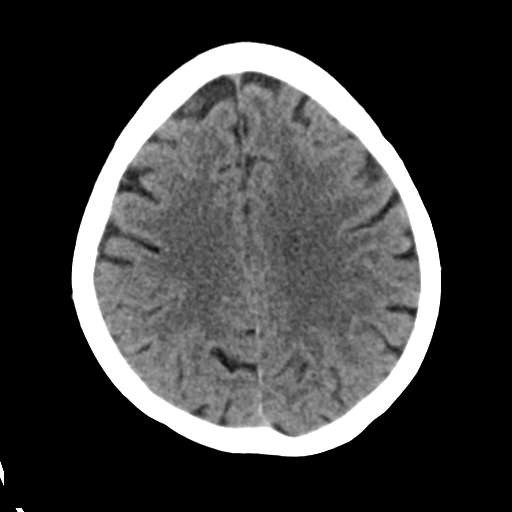
[im 19/30  bone]
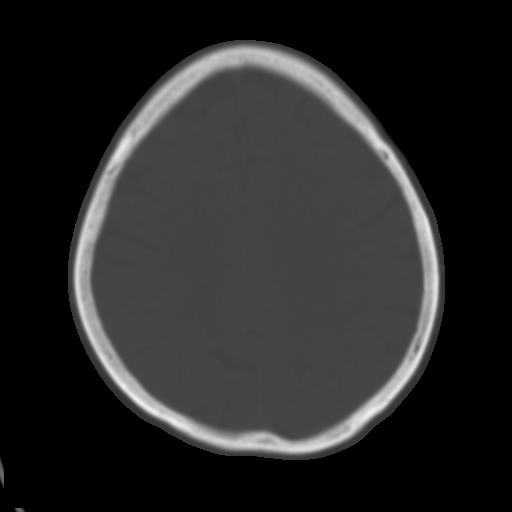
[im 22/30  brain]
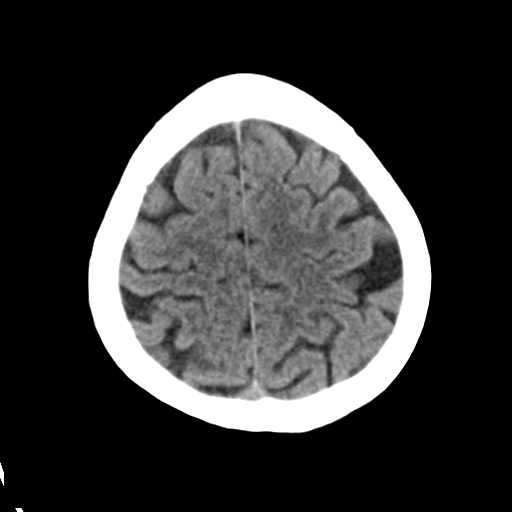
[im 26/30  brain]
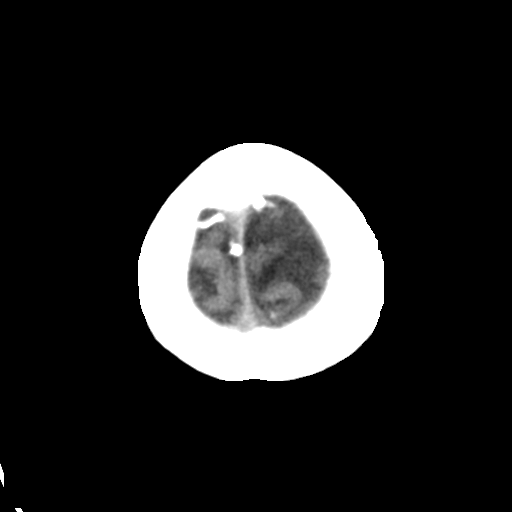

[Series 4: head bone · axial · 0.38mm/px · z∈[-86,-56]mm · 3 of 75 slices shown]
[im 8/75  bone]
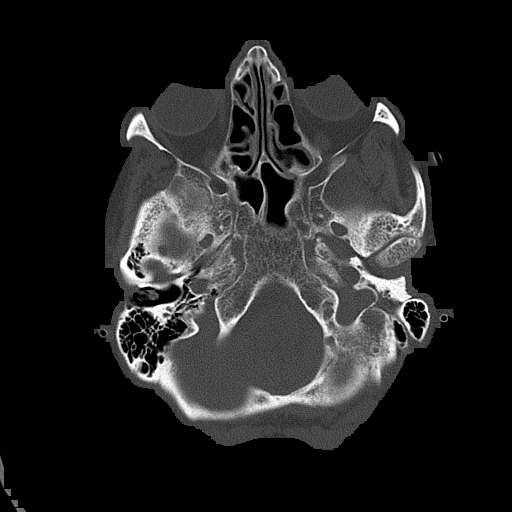
[im 15/75  bone]
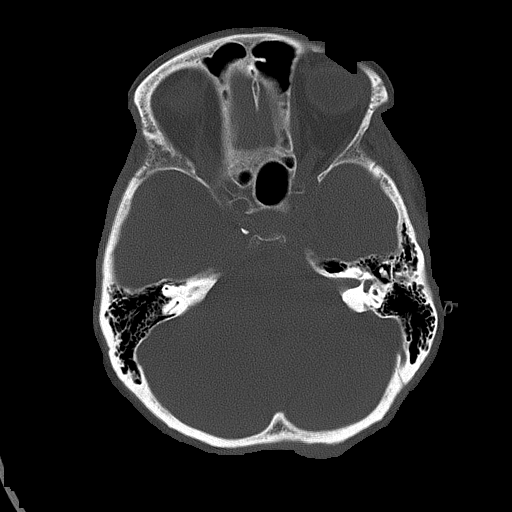
[im 23/75  bone]
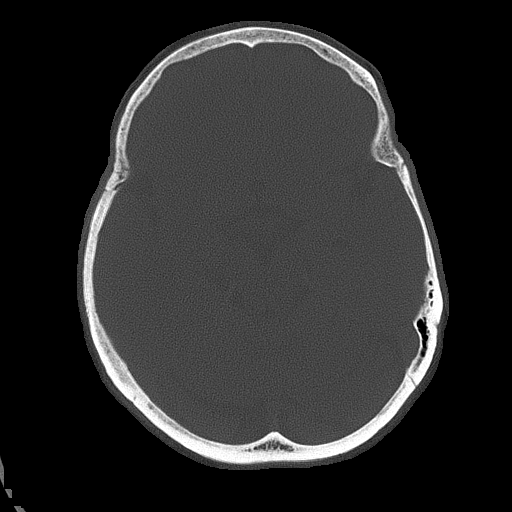

[Series 5: head without cor · coronal · non-contrast · 0.29mm/px · 3 of 62 slices shown]
[im 21/62  brain]
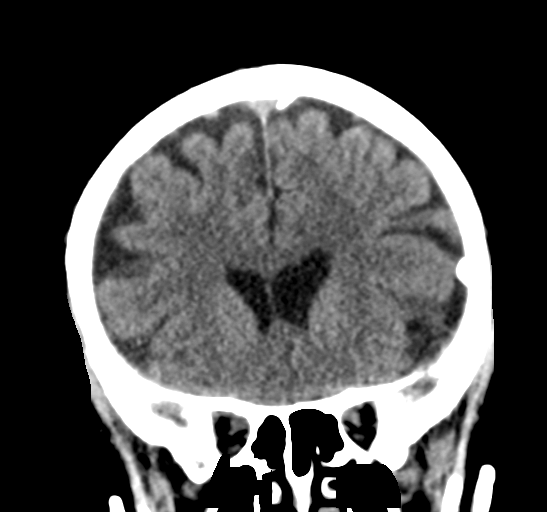
[im 28/62  brain]
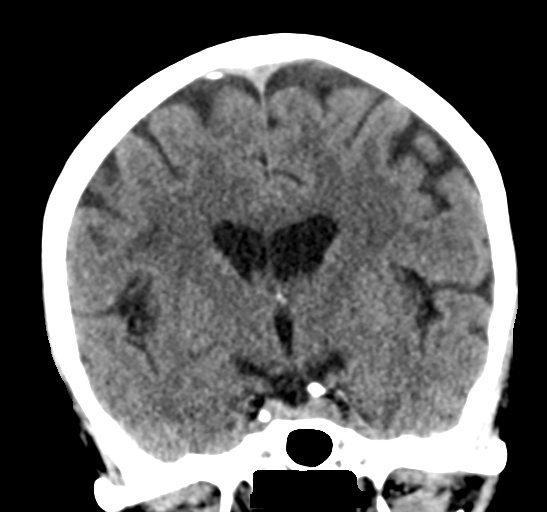
[im 34/62  brain]
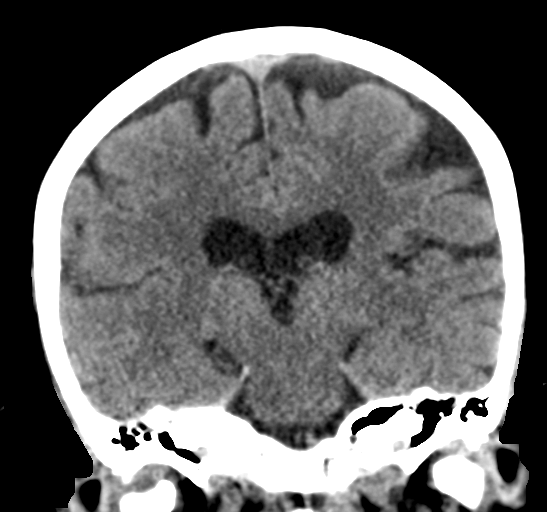

[Series 6: head without sag · sagittal · non-contrast · 0.29mm/px · 3 of 53 slices shown]
[im 18/53  brain]
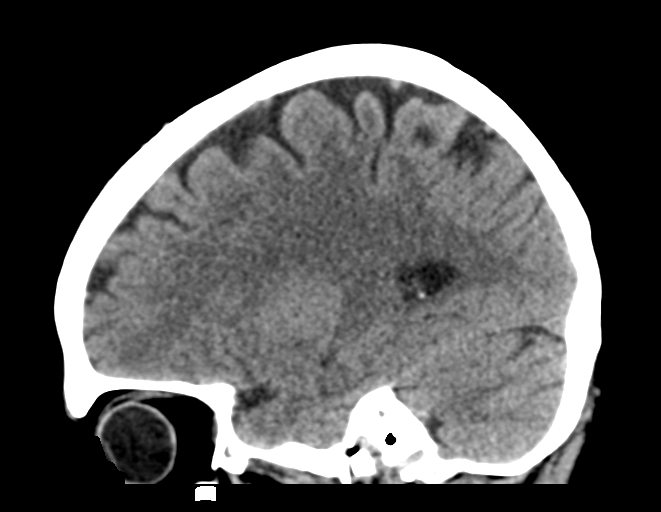
[im 27/53  brain]
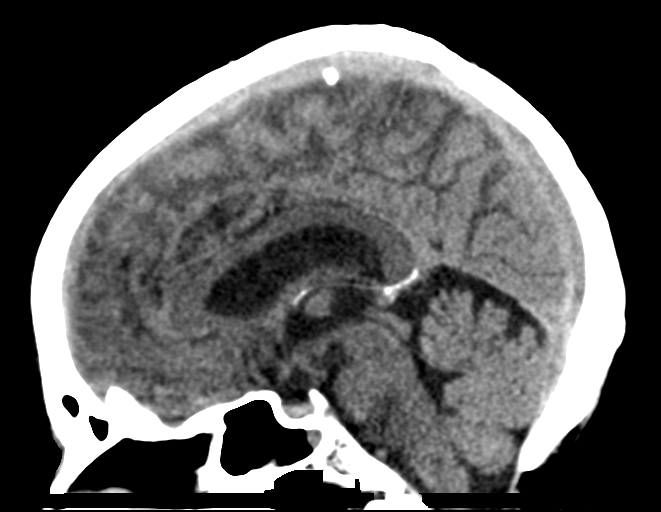
[im 35/53  brain]
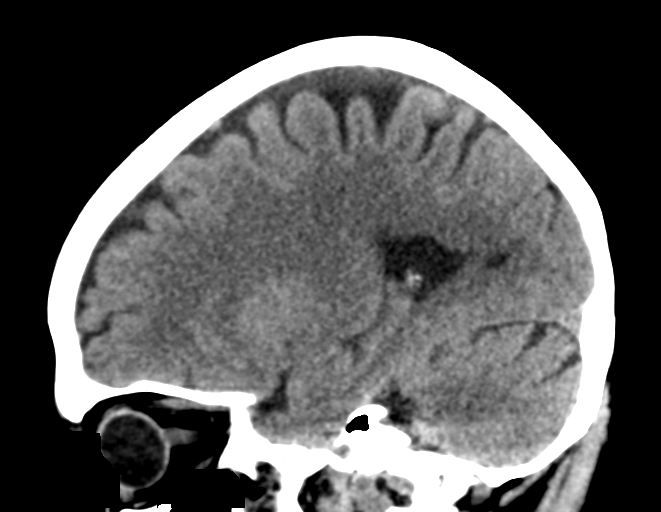

[16 of 47 positions shown; findings below may reference images not displayed]

FINDINGS: Brain: No acute territorial infarction, hemorrhage or intracranial
mass is visualized. Stable ventricle size. Stable calcified left
frontal dural nodule. Mild small vessel ischemic changes of the
white matter.

Vascular: No hyperdense vessels. Scattered calcifications at the
carotid siphons

Skull: Normal. Negative for fracture or focal lesion.

Sinuses/Orbits: Retention cysts in the left sphenoid sinus. Mild
mucosal thickening in the ethmoid sinuses. No acute orbital
abnormality.

Other: None
IMPRESSION: 1. No CT evidence for acute intracranial abnormality.
2. Mild atrophy and small vessel ischemic changes of the white
matter.

## 2018-04-17 MED ORDER — OXYCODONE-ACETAMINOPHEN 10-325 MG PO TABS: 1.0000 | ORAL_TABLET | ORAL | 0 refills | Status: DC | PRN

## 2018-04-17 NOTE — Telephone Encounter (Signed)
Last Zolpidem RX: 03/14/18, #30 Last OV: 11/2017 Next OV: 06/09/18 UDS: 03/07/17 ? Past due CSC: 03/07/17 CSR: No discrepancies identified

## 2018-04-17 NOTE — Telephone Encounter (Signed)
Requesting:Percocet  Contract:yes WLK:[id number] one last OV:12/09/17 Next OV:06/09/2018 Last Refill:03/21/2018  #[id number] Database:   Please advise

## 2018-04-17 NOTE — Addendum Note (Signed)
Addended by: Magdalene Molly A on: 04/17/2018 10:43 AM   Modules accepted: Orders

## 2018-04-18 ENCOUNTER — Other Ambulatory Visit: Payer: Self-pay | Admitting: Family Medicine

## 2018-04-18 ENCOUNTER — Other Ambulatory Visit: Payer: Self-pay | Admitting: Internal Medicine

## 2018-04-18 DIAGNOSIS — R52 Pain, unspecified: Secondary | ICD-10-CM

## 2018-04-18 NOTE — Telephone Encounter (Signed)
Patient that she has not had in about a year, but she would like to restart.

## 2018-04-18 NOTE — Telephone Encounter (Signed)
Patient notified that med was sent in.

## 2018-04-19 IMAGING — CT CT ABD-PELV W/ CM
3 of 10 series · 11 of 46 positions shown, 16 images · IV contrast (iopamidol)
Comparison: 07/29/2014 CT

CLINICAL DATA: Abnormal liver function tests

EXAM:
CT ABDOMEN AND PELVIS WITHOUT AND WITH CONTRAST
TECHNIQUE: Multidetector CT imaging of the abdomen and pelvis was performed
using the standard protocol following bolus administration of
intravenous contrast.
CONTRAST:  100mL [id number] IOPAMIDOL ([id number]) INJECTION 61%

[Series 7: coronal arterial · coronal · arterial · 0.59mm/px · 3 of 70 slices shown, 4 images]
[im 18/70  soft-tissue]
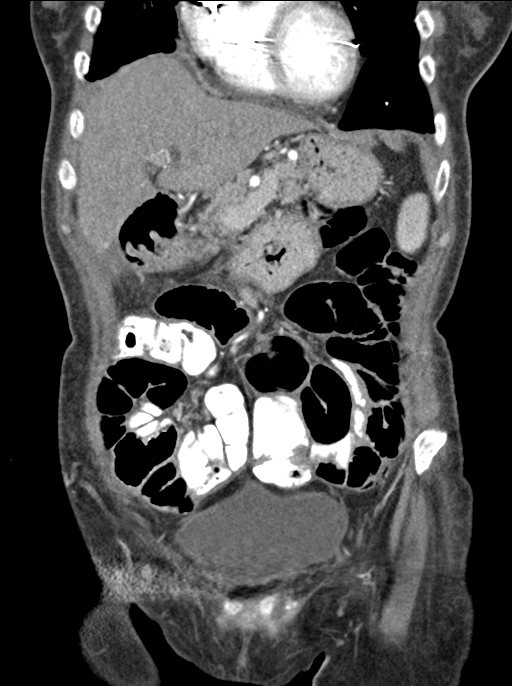
[im 35/70  soft-tissue]
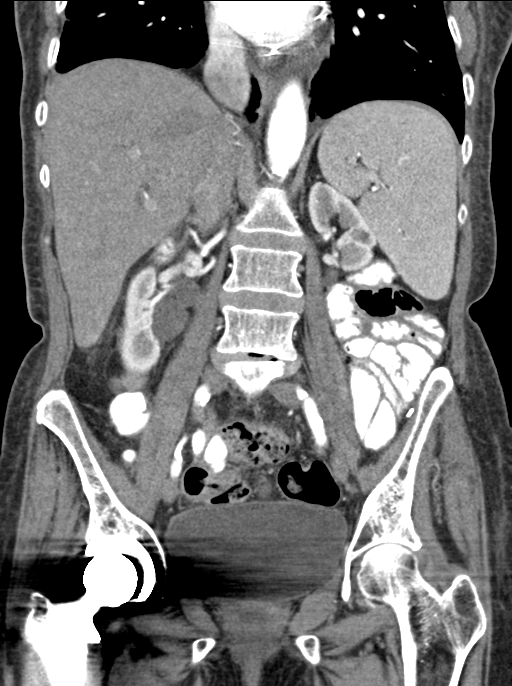
[im 35/70  bone]
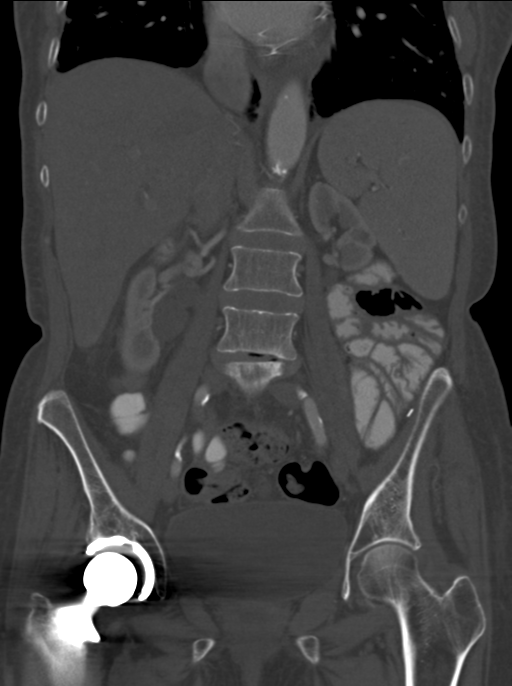
[im 52/70  soft-tissue]
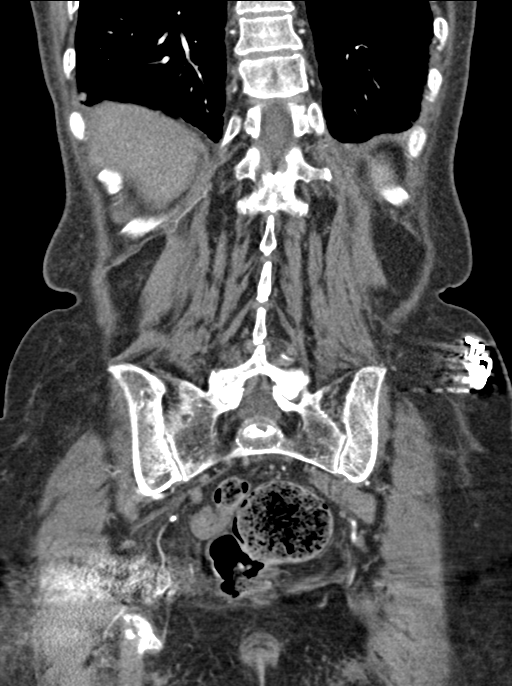

[Series 9: venous phase 5.0 i30f 2 · axial · portal-venous · 0.62mm/px · z∈[+957,+1157]mm · 3 of 82 slices shown, 7 images]
[im 21/82  soft-tissue]
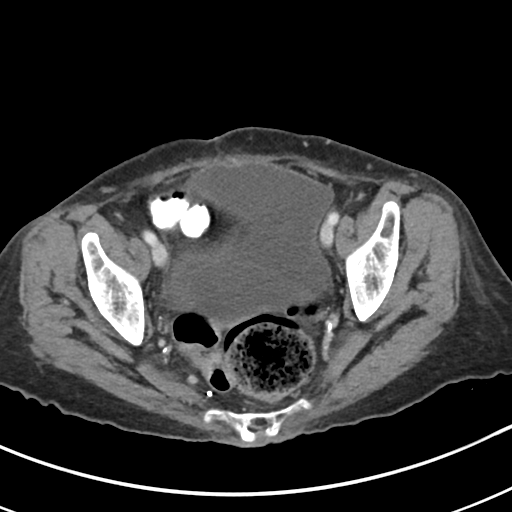
[im 21/82  lung]
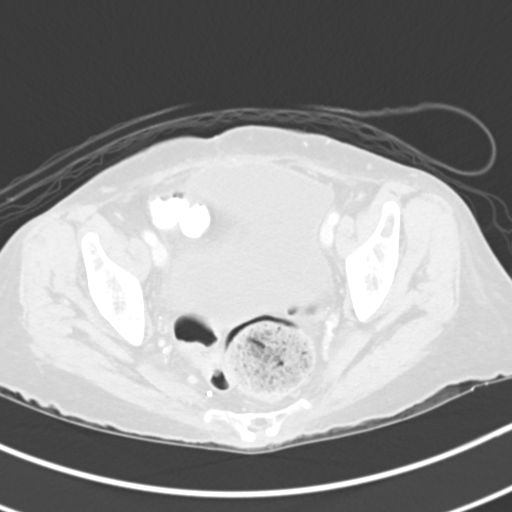
[im 21/82  bone]
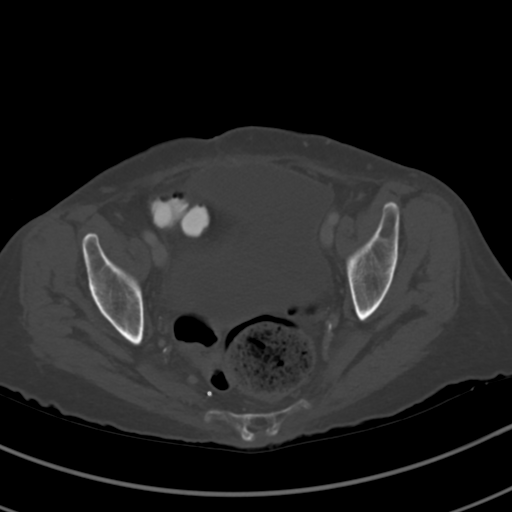
[im 41/82  soft-tissue]
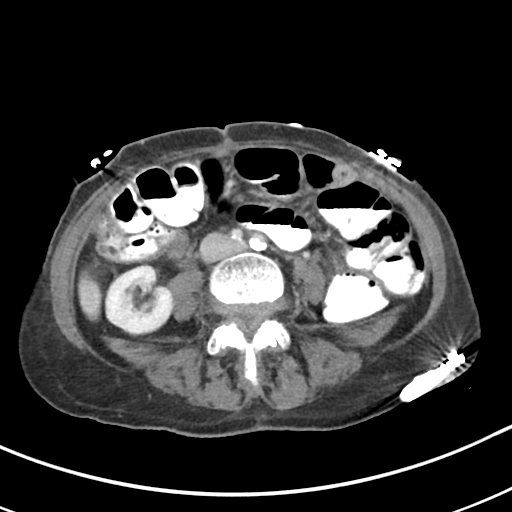
[im 41/82  lung]
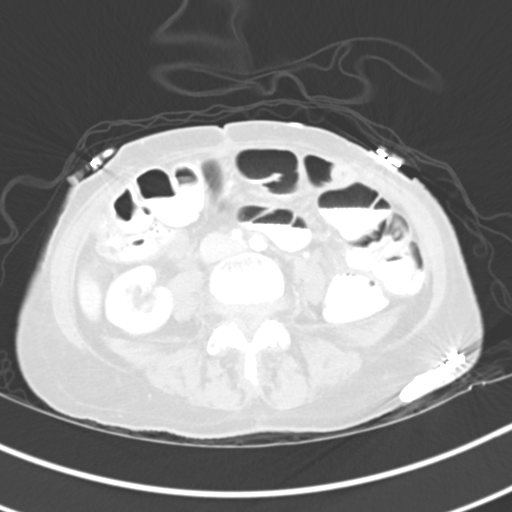
[im 61/82  soft-tissue]
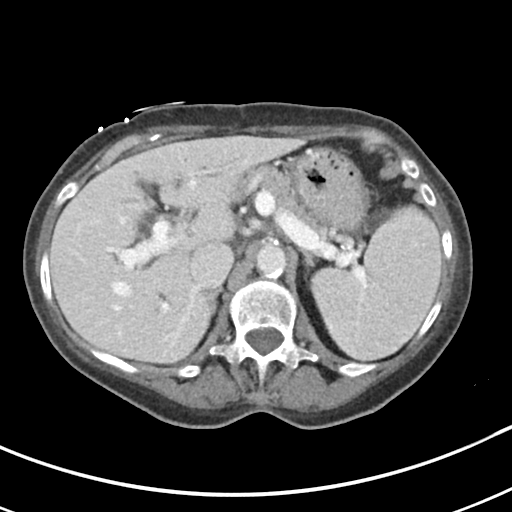
[im 61/82  lung]
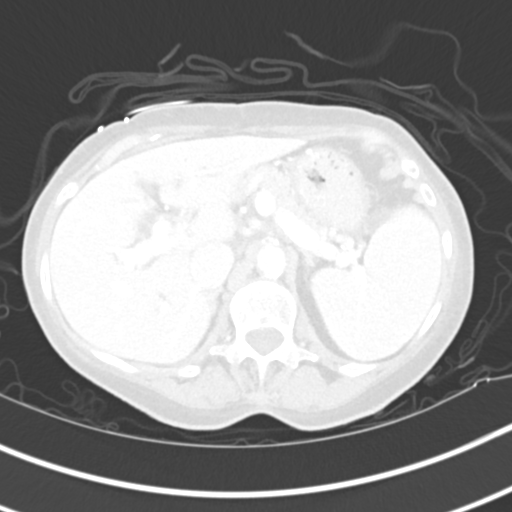

[Series 10: thins · axial · 0.62mm/px · z∈[+898,+1100]mm · 5 of 203 slices shown]
[im 21/203  soft-tissue]
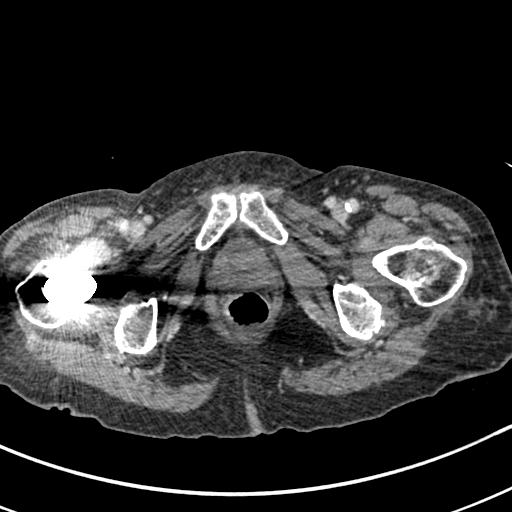
[im 41/203  soft-tissue]
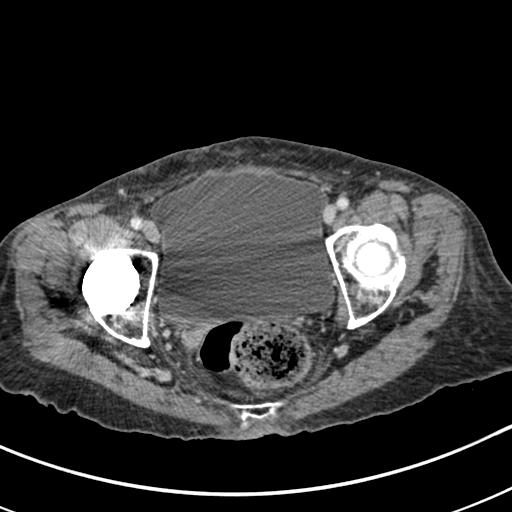
[im 61/203  soft-tissue]
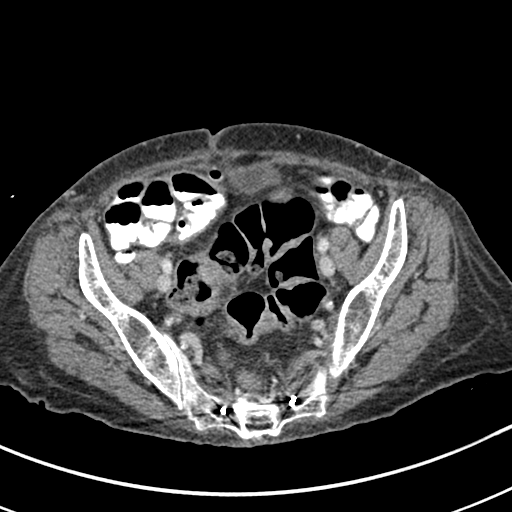
[im 81/203  soft-tissue]
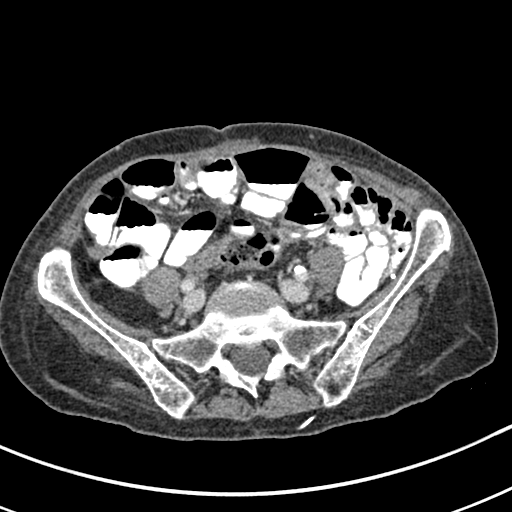
[im 122/203  soft-tissue]
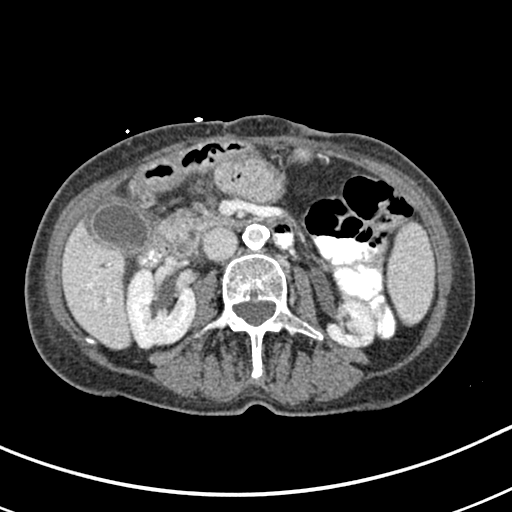

[11 of 46 positions shown; findings below may reference images not displayed]

FINDINGS: Lower chest: Coronary sinus, right atrial and right ventricular
pacing leads are noted. Heart is within normal limits for size. No
pericardial effusion. Minimal subpleural areas of reticulation
pulmonary opacities are noted, nonspecific but may reflect post
inflammatory postinfectious change. Mild peribronchial thickening is
noted to both lower lobes. Minimal subpleural scarring is seen in
the right middle lobe. No effusion or pneumothorax.

Hepatobiliary: Pericholecystic fluid is noted with gallbladder
thickening and dependent gallstones as well as biliary sludge noted.
Findings would be consistent with an acute cholecystitis. The liver
is unremarkable. Mild intra and extrahepatic ductal dilatation with
soft tissue debris in the distal common bile duct near the ampulla
is noted.

Pancreas: No pancreatic ductal dilatation or mass.  Inflammation.

Spleen: Mild splenomegaly with new 11 mm circumscribed hypodensity
within the spleen that may reflect an acquired cyst.

Adrenals/Urinary Tract: Normal bilateral adrenal glands. Mild renal
cortical thinning on the left with scarring in the lower pole. A
tiny too small to characterize cyst is seen in the lower pole of the
left kidney. No nephrolithiasis. Chronic mild caliectasis of the
right renal collecting system and right-sided renal pelvis. The
urinary bladder is physiologically distended without focal mural
thickening.

Stomach/Bowel: Decompressed stomach. Enteric contrast noted within
small bowel with contrast reaching the cecum. No bowel obstruction
or inflammation. Large amount of retained stool in the rectosigmoid.
Normal-appearing appendix.

Vascular/Lymphatic: Moderate aortoiliac atherosclerosis without
aneurysm. No lymphadenopathy.

Reproductive: Status post hysterectomy. No adnexal masses.

Other: No free air nor free fluid.

Musculoskeletal: Right hip arthroplasty. Sacral neural stimulating
device.
IMPRESSION: 1. Abnormal appearing gallbladder with biliary sludge a calculi.
Gallbladder wall thickening pericholecystic fluid raise concern for
cholecystitis. There is mild intra and extrahepatic ductal
dilatation soft tissue densities in the distal common bowel duct
that may reflect biliary sludge/debris.
2. Unremarkable CT appearance of the pancreas.
3. Mild peribronchial thickening with right greater than left lower
lobe subpleural pulmonary opacities and reticulations may reflect
stigmata postinfectious or postinflammatory change.
4. New 11 mm hypodensity in the spleen may reflect an acquired cyst.
There is splenomegaly.
5. Mild bilateral renal cortical thinning with scarring in the left
pole. Tiny too small to characterize cyst lower pole the left kidney
is also noted.
6. Moderate aortoiliac atherosclerosis.  No lymphadenopathy.

## 2018-04-19 IMAGING — NM NM HEPATOBILIARY IMAGE, INC GB
2 series · 12 of 12 positions shown · non-contrast
Comparison: Right upper quadrant ultrasound 06/20/2017

CLINICAL DATA: Patient with elevated LFTs, cholelithiasis and
biliary ductal dilatation.

EXAM:
NUCLEAR MEDICINE HEPATOBILIARY IMAGING
TECHNIQUE: Sequential images of the abdomen were obtained [DATE] minutes
following intravenous administration of radiopharmaceutical.
RADIOPHARMACEUTICALS:  6.70 mCi Vc-00m  Choletec IV

[he hepatobiliary · 3.10mm/px · 6 of 30 frames shown (1 of 2)]
[frame 3/30]
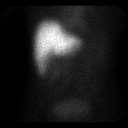
[frame 8/30]
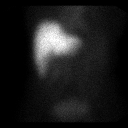
[frame 13/30]
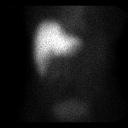
[frame 18/30]
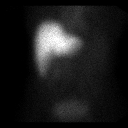
[frame 23/30]
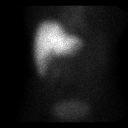
[frame 28/30]
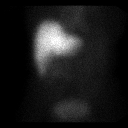

[he hepatobiliary · 3.10mm/px · 6 of 60 frames shown (2 of 2)]
[frame 6/60]
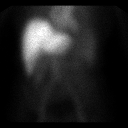
[frame 16/60]
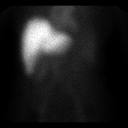
[frame 26/60]
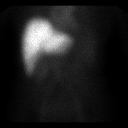
[frame 36/60]
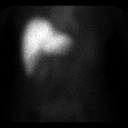
[frame 46/60]
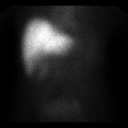
[frame 56/60]
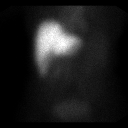

[12 of 12 positions shown; findings below may reference images not displayed]

FINDINGS: Prompt uptake and biliary excretion of activity by the liver is
seen.

No activity is demonstrated within the gallbladder or small bowel.
IMPRESSION: No radiotracer activity demonstrated within the gallbladder or small
bowel compatible with biliary obstruction which may be secondary to
common bile duct stone or potentially pancreatic mass/distal CBD
tumor. Recommend further evaluation with MRI/MRCP.

These results will be called to the ordering clinician or
representative by the Radiologist Assistant, and communication
documented in the PACS or zVision Dashboard.

## 2018-04-21 IMAGING — RF DG ERCP WO/W SPHINCTEROTOMY
1 series · 3 of 3 positions shown · non-contrast
Comparison: None.

CLINICAL DATA: 71-year-old female with choledocholithiasis

EXAM:
ERCP
TECHNIQUE: Multiple spot images obtained with the fluoroscopic device and
submitted for interpretation post-procedure.
FLUOROSCOPY TIME:  Fluoroscopy Time:  0 minutes 28 seconds

[Series 1: run · 3 of 3 slices shown]
[im 1/3]
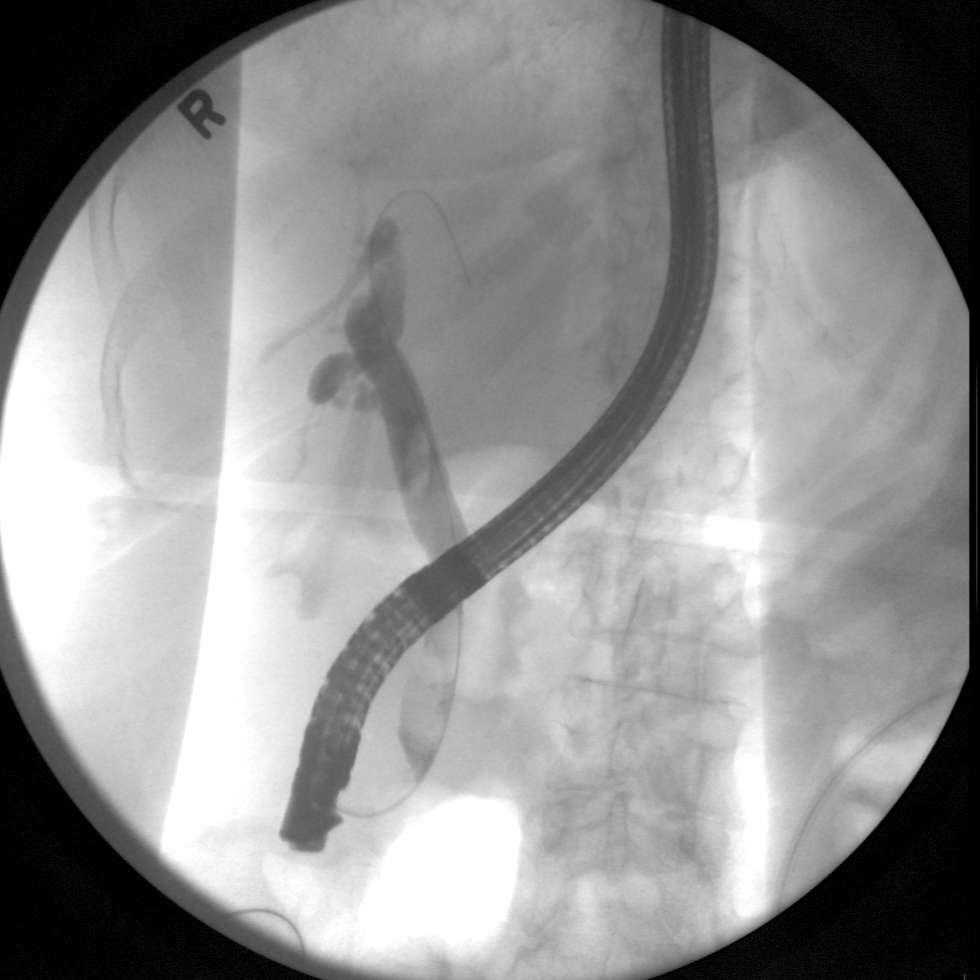
[im 2/3]
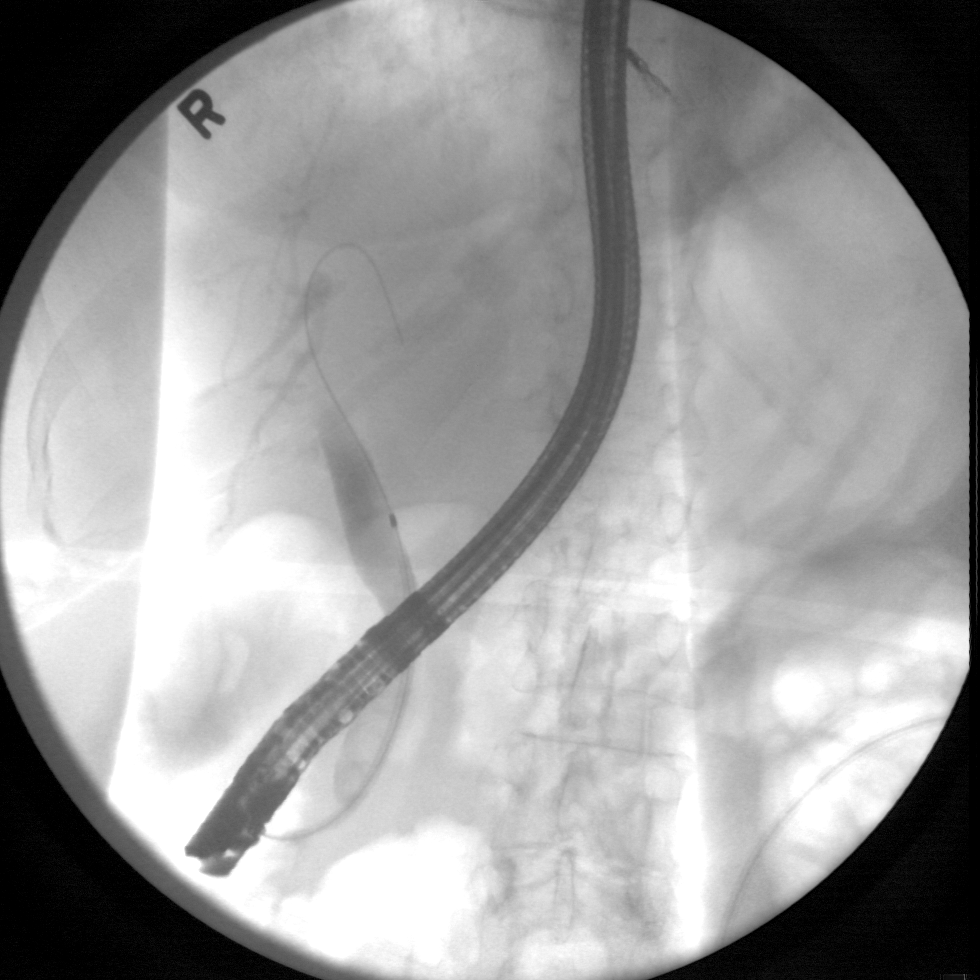
[im 3/3]
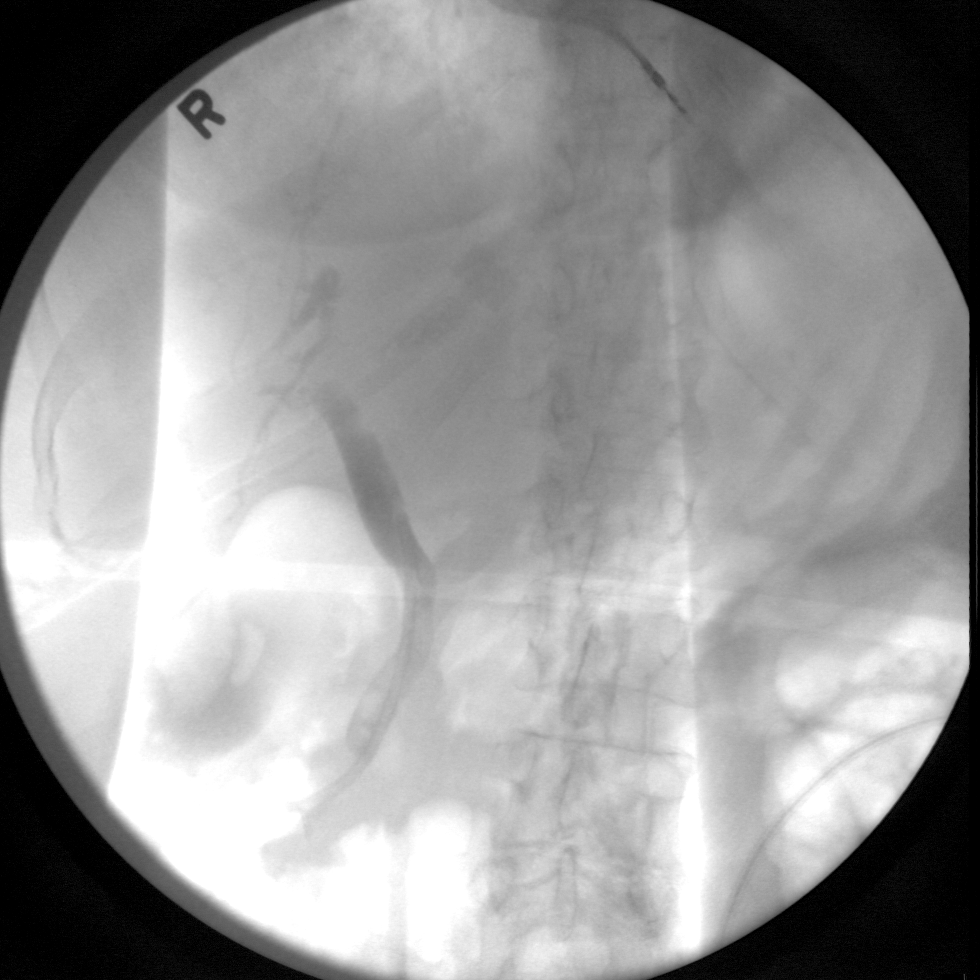

[3 of 3 positions shown; findings below may reference images not displayed]

FINDINGS: Three saved images are submitted for review. The images demonstrate
a flexible endoscope in the descending duodenum with wire
cannulation of the common bile duct. Cholangiogram demonstrates
numerous filling defects in the common duct consistent with
choledocholithiasis. On the final image, a plastic biliary stent has
been placed.
IMPRESSION: 1. Choledocholithiasis.
2. ERCP with placement of a plastic biliary stent.

These images were submitted for radiologic interpretation only.
Please see the procedural report for the amount of contrast and the
fluoroscopy time utilized.

## 2018-04-22 ENCOUNTER — Ambulatory Visit (INDEPENDENT_AMBULATORY_CARE_PROVIDER_SITE_OTHER): Payer: HMO

## 2018-04-22 DIAGNOSIS — I480 Paroxysmal atrial fibrillation: Secondary | ICD-10-CM

## 2018-04-22 DIAGNOSIS — I429 Cardiomyopathy, unspecified: Secondary | ICD-10-CM

## 2018-04-22 DIAGNOSIS — I428 Other cardiomyopathies: Secondary | ICD-10-CM

## 2018-04-23 ENCOUNTER — Other Ambulatory Visit: Payer: Self-pay

## 2018-04-23 ENCOUNTER — Encounter: Payer: Self-pay | Admitting: Family

## 2018-04-23 ENCOUNTER — Inpatient Hospital Stay: Payer: HMO | Attending: Family | Admitting: Family

## 2018-04-23 ENCOUNTER — Inpatient Hospital Stay: Payer: HMO

## 2018-04-23 ENCOUNTER — Telehealth: Payer: Self-pay | Admitting: Family

## 2018-04-23 VITALS — BP 132/62 | HR 65 | Temp 97.6°F | Resp 20 | Wt 86.8 lb

## 2018-04-23 DIAGNOSIS — D508 Other iron deficiency anemias: Secondary | ICD-10-CM

## 2018-04-23 DIAGNOSIS — K909 Intestinal malabsorption, unspecified: Secondary | ICD-10-CM | POA: Diagnosis not present

## 2018-04-23 DIAGNOSIS — Z9981 Dependence on supplemental oxygen: Secondary | ICD-10-CM | POA: Insufficient documentation

## 2018-04-23 DIAGNOSIS — D500 Iron deficiency anemia secondary to blood loss (chronic): Secondary | ICD-10-CM

## 2018-04-23 DIAGNOSIS — Z7901 Long term (current) use of anticoagulants: Secondary | ICD-10-CM | POA: Diagnosis not present

## 2018-04-23 DIAGNOSIS — Z79899 Other long term (current) drug therapy: Secondary | ICD-10-CM | POA: Diagnosis not present

## 2018-04-23 DIAGNOSIS — Z95828 Presence of other vascular implants and grafts: Secondary | ICD-10-CM

## 2018-04-23 LAB — CBC WITH DIFFERENTIAL (CANCER CENTER ONLY)
Abs Immature Granulocytes: 0.04 10*3/uL (ref 0.00–0.07)
Basophils Absolute: 0 10*3/uL (ref 0.0–0.1)
Basophils Relative: 1 %
Eosinophils Absolute: 0.1 10*3/uL (ref 0.0–0.5)
Eosinophils Relative: 2 %
HCT: 34.6 % — ABNORMAL LOW (ref 36.0–46.0)
Hemoglobin: 10.8 g/dL — ABNORMAL LOW (ref 12.0–15.0)
Immature Granulocytes: 1 %
Lymphocytes Relative: 8 %
Lymphs Abs: 0.6 10*3/uL — ABNORMAL LOW (ref 0.7–4.0)
MCH: 29.6 pg (ref 26.0–34.0)
MCHC: 31.2 g/dL (ref 30.0–36.0)
MCV: 94.8 fL (ref 80.0–100.0)
Monocytes Absolute: 0.6 10*3/uL (ref 0.1–1.0)
Monocytes Relative: 9 %
Neutro Abs: 5.6 10*3/uL (ref 1.7–7.7)
Neutrophils Relative %: 79 %
Platelet Count: 260 10*3/uL (ref 150–400)
RBC: 3.65 MIL/uL — ABNORMAL LOW (ref 3.87–5.11)
RDW: 12.6 % (ref 11.5–15.5)
WBC Count: 7 10*3/uL (ref 4.0–10.5)
nRBC: 0 % (ref 0.0–0.2)

## 2018-04-23 LAB — CMP (CANCER CENTER ONLY)
ALT: 9 U/L (ref 0–44)
AST: 17 U/L (ref 15–41)
Albumin: 3.9 g/dL (ref 3.5–5.0)
Alkaline Phosphatase: 104 U/L (ref 38–126)
Anion gap: 5 (ref 5–15)
BUN: 18 mg/dL (ref 8–23)
CO2: 29 mmol/L (ref 22–32)
Calcium: 8.5 mg/dL — ABNORMAL LOW (ref 8.9–10.3)
Chloride: 103 mmol/L (ref 98–111)
Creatinine: 1.03 mg/dL — ABNORMAL HIGH (ref 0.44–1.00)
GFR, Est AFR Am: >60 mL/min (ref >60)
GFR, Estimated: 54 mL/min — ABNORMAL LOW (ref >60)
Glucose, Bld: 100 mg/dL — ABNORMAL HIGH (ref 70–99)
Potassium: 4.7 mmol/L (ref 3.5–5.1)
Sodium: 137 mmol/L (ref 135–145)
Total Bilirubin: 0.3 mg/dL (ref 0.3–1.2)
Total Protein: 6.9 g/dL (ref 6.5–8.1)

## 2018-04-23 LAB — RETICULOCYTES
Immature Retic Fract: 1.2 % — ABNORMAL LOW (ref 2.3–15.9)
RBC.: 3.65 MIL/uL — ABNORMAL LOW (ref 3.87–5.11)
Retic Count, Absolute: 39.1 10*3/uL (ref 19.0–186.0)
Retic Ct Pct: 1.1 % (ref 0.4–3.1)

## 2018-04-23 MED ORDER — SODIUM CHLORIDE 0.9% FLUSH
10.0000 mL | INTRAVENOUS | Status: DC | PRN
  Administered 2018-04-23: 10 mL via INTRAVENOUS
  Filled 2018-04-23: qty 10

## 2018-04-23 MED ORDER — HEPARIN SOD (PORK) LOCK FLUSH 100 UNIT/ML IV SOLN
500.0000 [IU] | Freq: Once | INTRAVENOUS | Status: AC
  Administered 2018-04-23: 500 [IU] via INTRAVENOUS
  Filled 2018-04-23: qty 5

## 2018-04-23 NOTE — Telephone Encounter (Signed)
Appointments scheduled calendar printed per 2/5 los

## 2018-04-23 NOTE — Patient Instructions (Signed)

## 2018-04-23 NOTE — Progress Notes (Signed)
Hematology and Oncology Follow Up Visit  [name] [id number] 12/24/1945 73 y.o. 04/23/2018   Principle Diagnosis:  Iron deficiency anemia  Current Therapy:   IV iron as indicated   Interim History:  [name] is here today for follow-up. She is doing well and states that her last iron infusion in December really helped her feel better.  Her Hgb is now up to 10.8 with an MCV of 94.  She is doing well on 3L supplemental O2 and states that her SOB is stable.  She has an occasional mild headache.  She has had no issues on Xarelto. No episodes of bleeding, no bruising or petechiae.  She has occasional episodes of fatigue. She stays busy with her sweet family and will take breaks to rest when needed.  No fever, chills, n/v, cough, rash, dizziness, SOB, chest pain, palpitations, abdominal pain or changes in bowel or bladder habits.  She has occasional numbness and tingling in her hands. This comes and goes.  No falls or syncopal episodes to report.  No swelling or tenderness in her extremities at this time.  No lymphadenopathy noted on exam.  She has a good appetite and is staying well hydrated. Her weight is stable.   ECOG Performance Status: 1 - Symptomatic but completely ambulatory  Medications:  Allergies as of 04/23/2018      Reactions   Dabigatran Etexilate Mesylate Other (See Comments)   INTERNAL BLEEDING-pradaxa   Talwin [pentazocine] Other (See Comments)   hallucinations   Lactate Rash      Medication List       Accurate as of April 23, 2018  1:04 PM. Always use your most recent med list.        acetaminophen 325 MG tablet Commonly known as:  TYLENOL Take 2 tablets (650 mg total) by mouth every 4 (four) hours as needed.   amLODipine 2.5 MG tablet Commonly known as:  NORVASC Take 1 tablet (2.5 mg total) by mouth daily.   apixaban 5 MG Tabs tablet Commonly known as:  ELIQUIS Take 1 tablet (5 mg total) by mouth 2 (two) times daily.   atorvastatin 20 MG  tablet Commonly known as:  LIPITOR Take 1 tablet (20 mg total) by mouth daily at 6 PM.   carvedilol 25 MG tablet Commonly known as:  COREG TAKE 1 TABLET BY MOUTH TWICE DAILY   denosumab 60 MG/ML Sosy injection Commonly known as:  PROLIA Inject 60 mg into the skin every 6 (six) months.   furosemide 40 MG tablet Commonly known as:  LASIX TAKE ONE TABLET (40mg ) BY MOUTH IN THE MORNING   gabapentin 600 MG tablet Commonly known as:  NEURONTIN TAKE 1 TABLET BY MOUTH 4 TIMES DAILY   lidocaine-prilocaine cream Commonly known as:  EMLA Apply 1 application topically as needed.   nitroGLYCERIN 0.4 MG SL tablet Commonly known as:  NITROSTAT Place 1 tablet (0.4 mg total) under the tongue every 5 (five) minutes as needed. CHEST PAIN   oxyCODONE-acetaminophen 10-325 MG tablet Commonly known as:  PERCOCET Take 1 tablet by mouth every 4 (four) hours as needed. PAIN   pantoprazole 40 MG tablet Commonly known as:  PROTONIX TAKE 1 TABLET BY MOUTH TWICE DAILY   promethazine 25 MG tablet Commonly known as:  PHENERGAN TAKE ONE TABLET BY MOUTH EVERY 8 HOURS AS NEEDED FOR NAUSEA   SPIRIVA RESPIMAT 1.25 MCG/ACT Aers Generic drug:  Tiotropium Bromide Monohydrate INHALE 2 PUFFS INTO THE LUNGS DAILY   tiZANidine 4 MG  tablet Commonly known as:  ZANAFLEX Take 1 tablet (4 mg total) by mouth 3 (three) times daily as needed. for muscle spams   zolpidem 5 MG tablet Commonly known as:  AMBIEN TAKE 1 TABLET BY MOUTH EVERY DAY AT BEDTIME AS NEEDED       Allergies:  Allergies  Allergen Reactions  . Dabigatran Etexilate Mesylate Other (See Comments)    INTERNAL BLEEDING-pradaxa  . Talwin [Pentazocine] Other (See Comments)    hallucinations  . Lactate Rash    Past Medical History, Surgical history, Social history, and Family History were reviewed and updated.  Review of Systems: All other 10 point review of systems is negative.   Physical Exam:  vitals were not taken for this visit.    Wt Readings from Last 3 Encounters:  03/24/18 89 lb 6.4 oz (40.6 kg)  03/04/18 90 lb (40.8 kg)  02/26/18 90 lb (40.8 kg)    Ocular: Sclerae unicteric, pupils equal, round and reactive to light Ear-nose-throat: Oropharynx clear, dentition fair Lymphatic: No cervical, supraclavicular or axillary adenopathy Lungs no rales or rhonchi, good excursion bilaterally Heart regular rate and rhythm, no murmur appreciated Abd soft, nontender, positive bowel sounds, no liver or spleen tip palpated on exam, no fluid wave  MSK no focal spinal tenderness, no joint edema Neuro: non-focal, well-oriented, appropriate affect Breasts: Deferred   Lab Results  Component Value Date   WBC 5.3 02/26/2018   HGB 9.5 (L) 02/26/2018   HCT 30.7 (L) 02/26/2018   MCV 97.5 02/26/2018   PLT 208 02/26/2018   Lab Results  Component Value Date   FERRITIN 904 (H) 02/26/2018   IRON 35 (L) 02/26/2018   TIBC 185 (L) 02/26/2018   UIBC 150 02/26/2018   IRONPCTSAT 19 (L) 02/26/2018   Lab Results  Component Value Date   RETICCTPCT 1.4 02/26/2018   RBC 3.15 (L) 02/26/2018   RBC 3.15 (L) 02/26/2018   RETICCTABS 64.6 04/01/2009   No results found for: KPAFRELGTCHN, LAMBDASER, KAPLAMBRATIO No results found for: IGGSERUM, IGA, IGMSERUM No results found for: Ronnald Ramp, A1GS, A2GS, Violet Baldy, MSPIKE, SPEI   Chemistry      Component Value Date/Time   NA 135 02/26/2018 1319   K 4.3 02/26/2018 1319   CL 102 02/26/2018 1319   CO2 29 02/26/2018 1319   BUN 13 02/26/2018 1319   CREATININE 0.81 02/26/2018 1319   CREATININE 0.91 03/16/2016 1539      Component Value Date/Time   CALCIUM 8.0 (L) 02/26/2018 1319   ALKPHOS 102 02/26/2018 1319   AST 19 02/26/2018 1319   ALT 11 02/26/2018 1319   BILITOT 0.4 02/26/2018 1319       Impression and Plan: [name] is a very pleasant 73 yo caucasian female with iron deficiency anemia secondary to malabsorption and possible GI blood loss with  Eliquis.  She is doing well and has only had some mild intermittent fatigue lately.  We will see what her iron studies show and bring her back in for infusion if needed.  We will plan to see her back in another 8 weeks.  She will contact our office with any questions or concerns. We can certainly see her sooner if need be.   Becky Peace, NP 2/5/20201:04 PM

## 2018-04-24 LAB — IRON AND TIBC
Iron: 81 ug/dL (ref 41–142)
Saturation Ratios: 43 % (ref 21–57)
TIBC: 186 ug/dL — ABNORMAL LOW (ref 236–444)
UIBC: 106 ug/dL — ABNORMAL LOW (ref 120–384)

## 2018-04-24 LAB — FERRITIN: Ferritin: 1266 ng/mL — ABNORMAL HIGH (ref 11–307)

## 2018-04-27 LAB — CUP PACEART REMOTE DEVICE CHECK
Battery Remaining Longevity: 11 mo
Battery Voltage: 2.86 V
Brady Statistic AP VP Percent: 1.29 %
Brady Statistic AP VS Percent: 0.01 %
Brady Statistic AS VP Percent: 98.65 %
Brady Statistic AS VS Percent: 0.05 %
Brady Statistic RA Percent Paced: 1.29 %
Brady Statistic RV Percent Paced: 99.93 %
Date Time Interrogation Session: 20200205032810
Implantable Lead Implant Date: 20111005
Implantable Lead Implant Date: 20111005
Implantable Lead Implant Date: 20111005
Implantable Lead Location: 753858
Implantable Lead Location: 753859
Implantable Lead Location: 753860
Implantable Lead Model: 4196
Implantable Lead Model: 5076
Implantable Lead Model: 5076
Implantable Pulse Generator Implant Date: 20111005
Lead Channel Impedance Value: 304 Ohm
Lead Channel Impedance Value: 361 Ohm
Lead Channel Impedance Value: 380 Ohm
Lead Channel Impedance Value: 437 Ohm
Lead Channel Impedance Value: 456 Ohm
Lead Channel Impedance Value: 475 Ohm
Lead Channel Impedance Value: 513 Ohm
Lead Channel Impedance Value: 532 Ohm
Lead Channel Impedance Value: 684 Ohm
Lead Channel Pacing Threshold Amplitude: 0.5 V
Lead Channel Pacing Threshold Amplitude: 0.5 V
Lead Channel Pacing Threshold Pulse Width: 0.4 ms
Lead Channel Pacing Threshold Pulse Width: 0.4 ms
Lead Channel Sensing Intrinsic Amplitude: 0.875 mV
Lead Channel Sensing Intrinsic Amplitude: 0.875 mV
Lead Channel Sensing Intrinsic Amplitude: 8 mV
Lead Channel Sensing Intrinsic Amplitude: 8 mV
Lead Channel Setting Pacing Amplitude: 1.25 V
Lead Channel Setting Pacing Amplitude: 2 V
Lead Channel Setting Pacing Amplitude: 2.5 V
Lead Channel Setting Pacing Pulse Width: 0.4 ms
Lead Channel Setting Pacing Pulse Width: 0.8 ms
Lead Channel Setting Sensing Sensitivity: 0.9 mV

## 2018-05-01 NOTE — Progress Notes (Signed)
Remote pacemaker transmission.   

## 2018-05-06 NOTE — Telephone Encounter (Signed)
Verification requested. Waiting on SOB.

## 2018-05-12 ENCOUNTER — Other Ambulatory Visit: Payer: Self-pay | Admitting: Family Medicine

## 2018-05-12 DIAGNOSIS — G4700 Insomnia, unspecified: Secondary | ICD-10-CM

## 2018-05-12 DIAGNOSIS — H6123 Impacted cerumen, bilateral: Secondary | ICD-10-CM | POA: Diagnosis not present

## 2018-05-12 DIAGNOSIS — H9012 Conductive hearing loss, unilateral, left ear, with unrestricted hearing on the contralateral side: Secondary | ICD-10-CM | POA: Diagnosis not present

## 2018-05-12 DIAGNOSIS — J343 Hypertrophy of nasal turbinates: Secondary | ICD-10-CM | POA: Diagnosis not present

## 2018-05-13 DIAGNOSIS — J449 Chronic obstructive pulmonary disease, unspecified: Secondary | ICD-10-CM | POA: Diagnosis not present

## 2018-05-13 DIAGNOSIS — R05 Cough: Secondary | ICD-10-CM | POA: Diagnosis not present

## 2018-05-13 DIAGNOSIS — I4891 Unspecified atrial fibrillation: Secondary | ICD-10-CM | POA: Diagnosis not present

## 2018-05-13 NOTE — Progress Notes (Signed)
Electrophysiology Office Note Date: 05/14/2018  ID:  Becky, Ross [birth year], MRN [id number]  PCP: Carollee Herter, Alferd Apa, DO Primary Cardiologist: Hochrein Electrophysiologist: Caryl Comes  CC: Pacemaker follow-up  [name] is a 73 y.o. female seen today for Dr Caryl Comes.  She presents today for routine electrophysiology followup.  Since last being seen in our clinic, the patient reports doing reasonably well. Shortness of breath is stable. LE edema improved.  She denies chest pain, palpitations, PND, orthopnea, nausea, vomiting, dizziness, syncope, weight gain, or early satiety.  Device History: MDT CRTP implanted 2011 for NICM, CHF   Past Medical History:  Diagnosis Date  . Anemia   . Arthritis    right hip  . Atrial fibrillation ([place])   . CAD (coronary artery disease)    mild disease per cath in 2011  . CHF (congestive heart failure) ([place])   . COPD (chronic obstructive pulmonary disease) (HCC)    continuous O2- 2l  . Diverticulitis   . Elbow fracture, left aug 2012  . Fibromyalgia    COUPLE OF TIMES A YEAR-PT HAS EPISODES OF CONFUSION-USUALLY INVOLVES DAY/NIGHT REVERSAL AND EPISODES OF TWITCHING AND FALLS--SEES DR. Jacelyn Grip - NEUROLOGIST-LAST SEEN 07/10/11  . Gastroparesis   . GERD (gastroesophageal reflux disease)   . H/O: GI bleed    from Pradaxa  . Hepatitis    hx of in high school   . HTN (hypertension)    under control; has been on med. x "years"  . Hx MRSA infection   . Hx of gastric ulcer   . Hx of stress fx aug 2011   right hip   . Iron malabsorption 08/14/2017  . LBBB (left bundle branch block)   . Oxygen dependent    2 liters via nasal cannula at all times  . Pacemaker    CRT therapy; followed by Dr. Caryl Comes  . Pleural effusion     s/p right thoracentesis 03/09  . Pneumonia - 03/09, 12/09, 02/10, 06/10   MOST RECENT FEB 2013  . Primary dilated cardiomyopathy (HCC)    EF 45 to 50% per echo in Jan 2012  . RLS (restless legs syndrome)   . Silent  aspiration   . Small bowel obstruction ([place])   . Urinary incontinence     -INTERSTIM IMPLANT NOT FUNCTIONING PER PT  . Venous embolism and thrombosis of subclavian vein ([place])    after pacemaker insertion Oct 2011  . Vitamin B12 deficiency    Past Surgical History:  Procedure Laterality Date  . APPENDECTOMY    . CARDIAC CATHETERIZATION  04/08/2006, 11/16/2009  . CHOLECYSTECTOMY N/A 06/27/2017   Procedure: OPEN CHOLECYSTECTOMY;  Surgeon: Ralene Ok, MD;  Location: [facility];  Service: General;  Laterality: N/A;  . COLECTOMY     Intestinal resection (5times)  . COLECTOMY  02/04/2000   ex. lap., intra-abd. subtotal colectomy with ileosigmoid colon anastomosies and lysis of adhesions  . CYSTOSCOPY  11/11/2008  . CYSTOSCOPY W/ RETROGRADES  11/11/2008   right  . CYSTOSCOPY WITH INJECTION  04/30/2006   transurethral collagen injection; incision vaginal stenosis  . ERCP N/A 06/23/2017   Procedure: ENDOSCOPIC RETROGRADE CHOLANGIOPANCREATOGRAPHY (ERCP);  Surgeon: Carol Ada, MD;  Location: [facility];  Service: Endoscopy;  Laterality: N/A;  . ERCP N/A 06/24/2017   Procedure: ENDOSCOPIC RETROGRADE CHOLANGIOPANCREATOGRAPHY (ERCP);  Surgeon: [name], MD;  [facility];  Service: Endoscopy;  Laterality: N/A;  . ESOPHAGOGASTRODUODENOSCOPY N/A 02/02/2013   Procedure: ESOPHAGOGASTRODUODENOSCOPY (EGD);  Surgeon: Lafayette Dragon, MD;  [facility];  Service: Endoscopy;  Laterality: N/A;  . EXPLORATORY LAPAROTOMY  04/27/2009   lysis of adhesions, gastrostomy tube  . GASTROCUTANEOUS FISTULA CLOSURE    . INTERSTIM IMPLANT PLACEMENT  05/28/2006 - stage I   06/05/2006 - stage II  . INTERSTIM IMPLANT PLACEMENT  02/05/2012   Procedure: [name] IMPLANT FIRST STAGE;  Surgeon: Reece Packer, MD;  [facility];  Service: Urology;  Laterality: N/A;  Replacement of Interstim Lead     . INTERSTIM IMPLANT REVISION  03/06/2011   Procedure: REVISION OF [name];  Surgeon: Reece Packer, MD;  [facility];  Service: Urology;  Laterality: N/A;  Replacement of Neurostimulator  . INTERSTIM IMPLANT REVISION  10/23/2007  . IR IMAGING GUIDED PORT INSERTION  10/14/2017  . PACEMAKER PLACEMENT  12/21/2009  . Peg removed     with complications  . PORT-A-CATH REMOVAL  07/27/2011   Procedure: REMOVAL PORT-A-CATH;  Surgeon: Rolm Bookbinder, MD;  Location: [facility];  Service: General;  Laterality: N/A;  . PORTACATH PLACEMENT  12/16/2009  . SAVORY DILATION N/A 02/02/2013   Procedure: SAVORY DILATION;  Surgeon: Lafayette Dragon, MD;  [facility];  Service: Endoscopy;  Laterality: N/A;  . SMALL INTESTINE SURGERY  05/20/2001   ex. lap., resection of small bowel stricture; gastrostomy; insertion central line  . TONSILLECTOMY  age 69  . TOTAL ABDOMINAL HYSTERECTOMY     complete  . TOTAL HIP ARTHROPLASTY  08/03/2011   Procedure: TOTAL HIP ARTHROPLASTY ANTERIOR APPROACH;  Surgeon: Mcarthur Rossetti, MD;  [facility];  Service: Orthopedics;  Laterality: Right;    Current Outpatient Medications  Medication Sig Dispense Refill  . acetaminophen (TYLENOL) 325 MG tablet Take 2 tablets (650 mg total) by mouth every 4 (four) hours as needed.    [name] amLODipine (NORVASC) 2.5 MG tablet Take 1 tablet (2.5 mg total) by mouth daily. 90 tablet 3  . apixaban (ELIQUIS) 5 MG TABS tablet Take 1 tablet (5 mg total) by mouth 2 (two) times daily. 180 tablet 1  . atorvastatin (LIPITOR) 20 MG tablet Take 1 tablet (20 mg total) by mouth daily at 6 PM. 90 tablet 1  . carvedilol (COREG) 25 MG tablet TAKE 1 TABLET BY MOUTH TWICE DAILY 180 tablet 3  . denosumab (PROLIA) 60 MG/ML SOSY injection Inject 60 mg into the skin every 6 (six) months.    . furosemide (LASIX) 40 MG tablet TAKE ONE TABLET (40mg ) BY MOUTH IN THE MORNING 90 tablet 3  . gabapentin (NEURONTIN) 600 MG tablet TAKE 1 TABLET BY MOUTH 4 TIMES DAILY 120 tablet 2  . lidocaine-prilocaine (EMLA) cream Apply 1 application  topically as needed. 30 g 6  . nitroGLYCERIN (NITROSTAT) 0.4 MG SL tablet Place 1 tablet (0.4 mg total) under the tongue every 5 (five) minutes as needed. CHEST PAIN 25 tablet 5  . oxyCODONE-acetaminophen (PERCOCET) 10-325 MG tablet Take 1 tablet by mouth every 4 (four) hours as needed. PAIN 120 tablet 0  . pantoprazole (PROTONIX) 40 MG tablet TAKE 1 TABLET BY MOUTH TWICE DAILY 180 tablet 0  . promethazine (PHENERGAN) 25 MG tablet TAKE ONE TABLET BY MOUTH EVERY 8 HOURS AS NEEDED FOR NAUSEA 40 tablet 2  . SPIRIVA RESPIMAT 1.25 MCG/ACT AERS INHALE 2 PUFFS INTO THE LUNGS DAILY 1 Inhaler 5  . tiZANidine (ZANAFLEX) 4 MG tablet Take 1 tablet (4 mg total) by mouth 3 (three) times daily as needed. for muscle spams 90 tablet 2  . triamcinolone  ointment (KENALOG) 0.1 % APPLY TO SKIN TWICE DAILY FOR 14 DAYS    . zolpidem (AMBIEN) 5 MG tablet TAKE 1 TABLET BY MOUTH EVERY DAY AT BEDTIME AS NEEDED 30 tablet 0   No current facility-administered medications for this visit.     Allergies:   Dabigatran etexilate mesylate; Talwin [pentazocine]; and Lactate   Social History: Social History   Socioeconomic History  . Marital status: Married    Spouse name: Not on file  . Number of children: 5  . Years of education: Not on file  . Highest education level: Not on file  Occupational History  . Occupation: disabled    Employer: RETIRED  Social Needs  . Financial resource strain: Not on file  . Food insecurity:    Worry: Not on file    Inability: Not on file  . Transportation needs:    Medical: Not on file    Non-medical: Not on file  Tobacco Use  . Smoking status: Former Smoker    Packs/day: 1.00    Years: 20.00    Pack years: 20.00    Last attempt to quit: 03/19/1986    Years since quitting: 32.1  . Smokeless tobacco: Never Used  Substance and Sexual Activity  . Alcohol use: No    Alcohol/week: 0.0 standard drinks  . Drug use: No  . Sexual activity: Yes  Lifestyle  . Physical activity:     Days per week: Not on file    Minutes per session: Not on file  . Stress: Not on file  Relationships  . Social connections:    Talks on phone: Not on file    Gets together: Not on file    Attends religious service: Not on file    Active member of club or organization: Not on file    Attends meetings of clubs or organizations: Not on file    Relationship status: Not on file  . Intimate partner violence:    Fear of current or ex partner: Not on file    Emotionally abused: Not on file    Physically abused: Not on file    Forced sexual activity: Not on file  Other Topics Concern  . Not on file  Social History Narrative   Occupation: Disabled   Married - second time - biologic daughter (in [place]) from first marriage - adopted 4 3 alive with second marriage   Alcohol Use - no   Illicit Drug Use - no   Patient is a former smoker, quit 1988 x69yrs, <1ppd.    Daily Caffeine Use   01/20/2015 - updated    Family History: Family History  Problem Relation Age of Onset  . Heart disease Father   . Stroke Mother   . Heart disease Mother   . Heart disease Brother   . Breast cancer Maternal Aunt   . Heart disease Paternal Aunt   . Diabetes Paternal Grandfather   . Colon cancer Neg Hx      Review of Systems: All other systems reviewed and are otherwise negative except as noted above.   Physical Exam: VS:  BP 92/64   Pulse 68   Ht 5' (1.524 m)   Wt 90 lb 3.2 oz (40.9 kg)   SpO2 96%   BMI 17.62 kg/m  , BMI Body mass index is 17.62 kg/m.  GEN- The patient is elderly and frail appearing, alert and oriented x 3 today.   HEENT: normocephalic, atraumatic; sclera clear, conjunctiva pink; hearing intact; oropharynx  clear; neck supple  Lungs- Clear to ausculation bilaterally, normal work of breathing.  No wheezes, rales, rhonchi Heart- Regular rate and rhythm (paced)  GI- soft, non-tender, non-distended, bowel sounds present  Extremities- no clubbing, cyanosis, or edema  MS- no  significant deformity or atrophy Skin- warm and dry, no rash or lesion; PPM pocket well healed Psych- euthymic mood, full affect Neuro- strength and sensation are intact  PPM Interrogation- reviewed in detail today,  See PACEART report  EKG:  EKG is not ordered today.  Recent Labs: 06/29/2017: Magnesium 1.6 03/06/2018: B Natriuretic Peptide 180.8 04/23/2018: ALT 9; BUN 18; Creatinine 1.03; Hemoglobin 10.8; Platelet Count 260; Potassium 4.7; Sodium 137   Wt Readings from Last 3 Encounters:  05/14/18 90 lb 3.2 oz (40.9 kg)  04/23/18 86 lb 12.8 oz (39.4 kg)  03/24/18 89 lb 6.4 oz (40.6 kg)     Other studies Reviewed: Additional studies/ records that were reviewed today include: Dr Caryl Comes and Dr Hochrein's office notes   Assessment and Plan:  1.  NICM Normal PPM function See Pace Art report No changes today EF improved post CRT Euvolemic on exam Estimated longevity 9 months, will start monthly [facility]  2.  Paroxysmal atrial fibrillation Burden by device interrogation 0% Continue Eliquis  No bleeding issues   Current medicines are reviewed at length with the patient today.   The patient does not have concerns regarding her medicines.  The following changes were made today:  none  Labs/ tests ordered today include: none No orders of the defined types were placed in this encounter.    Disposition:   Follow up with [facility], Dr Caryl Comes 1 year     Signed, Chanetta Marshall, NP 05/14/2018 10:14 AM  [facility] [address] [place] [address] [phone] (office) [phone] (fax)

## 2018-05-14 ENCOUNTER — Ambulatory Visit (INDEPENDENT_AMBULATORY_CARE_PROVIDER_SITE_OTHER): Payer: HMO | Admitting: Family Medicine

## 2018-05-14 ENCOUNTER — Ambulatory Visit (INDEPENDENT_AMBULATORY_CARE_PROVIDER_SITE_OTHER): Payer: HMO | Admitting: Nurse Practitioner

## 2018-05-14 ENCOUNTER — Encounter: Payer: Self-pay | Admitting: Nurse Practitioner

## 2018-05-14 ENCOUNTER — Telehealth: Payer: Self-pay | Admitting: Family Medicine

## 2018-05-14 ENCOUNTER — Encounter (INDEPENDENT_AMBULATORY_CARE_PROVIDER_SITE_OTHER): Payer: Self-pay | Admitting: Family Medicine

## 2018-05-14 VITALS — BP 92/64 | HR 68 | Ht 60.0 in | Wt 90.2 lb

## 2018-05-14 DIAGNOSIS — I428 Other cardiomyopathies: Secondary | ICD-10-CM | POA: Diagnosis not present

## 2018-05-14 DIAGNOSIS — I480 Paroxysmal atrial fibrillation: Secondary | ICD-10-CM

## 2018-05-14 DIAGNOSIS — M5442 Lumbago with sciatica, left side: Secondary | ICD-10-CM | POA: Diagnosis not present

## 2018-05-14 DIAGNOSIS — I5032 Chronic diastolic (congestive) heart failure: Secondary | ICD-10-CM | POA: Diagnosis not present

## 2018-05-14 LAB — CUP PACEART INCLINIC DEVICE CHECK
Date Time Interrogation Session: 20200226102212
Implantable Lead Implant Date: 20111005
Implantable Lead Implant Date: 20111005
Implantable Lead Implant Date: 20111005
Implantable Lead Location: 753858
Implantable Lead Location: 753859
Implantable Lead Location: 753860
Implantable Lead Model: 4196
Implantable Lead Model: 5076
Implantable Lead Model: 5076
Implantable Pulse Generator Implant Date: 20111005

## 2018-05-14 NOTE — Patient Instructions (Signed)
Medication Instructions:  none If you need a refill on your cardiac medications before your next appointment, please call your pharmacy.   Lab work: none If you have labs (blood work) drawn today and your tests are completely normal, you will receive your results only by: [name] MyChart Message (if you have MyChart) OR . A paper copy in the mail If you have any lab test that is abnormal or we need to change your treatment, we will call you to review the results.  Testing/Procedures: none  Follow-Up: At [facility], you and your health needs are our priority.  As part of our continuing mission to provide you with exceptional heart care, we have created designated Provider Care Teams.  These Care Teams include your primary Cardiologist (physician) and Advanced Practice Providers (APPs -  Physician Assistants and Nurse Practitioners) who all work together to provide you with the care you need, when you need it. You will need a follow up appointment in 1 years.  Please call our office 2 months in advance to schedule this appointment.  You may see Dr Caryl Comes or one of the following Advanced Practice Providers on your designated Care Team:   Chanetta Marshall, NP . Tommye Standard, PA-C  Any Other Special Instructions Will Be Listed Below (If Applicable). Remote monitoring is used to monitor your Pacemaker  from home. This monitoring reduces the number of office visits required to check your device to one time per year. It allows [place] to keep an eye on the functioning of your device to ensure it is working properly. You are scheduled for a device check from home on 05/29/18. You may send your transmission at any time that day. If you have a wireless device, the transmission will be sent automatically. After your physician reviews your transmission, you will receive a postcard with your next transmission date.

## 2018-05-14 NOTE — Progress Notes (Signed)
Office Visit Note   Patient: [name]           Date of Birth: 18-Sep-1945           MRN: [id number] Visit Date: 05/14/2018 Requested by: [address], [place], [place] [address] [name], [place] [address] PCP: Carollee Herter, Alferd Apa, DO  Subjective: Chief Complaint  Patient presents with  . Left Hip - Pain    Sporadic pain in the left buttock x couple months and worsening. Occasionally radiates down back of thigh, to mid-thigh area. NKI. H/o right hip replacement.    HPI: She is a 73 year old with left posterior hip and leg pain.  Symptoms started a couple months ago, no injury.  Intermittent pain when standing to walk.  It does not happen every time.  Denies any groin pain.  Denies any numbness or weakness in her leg.  She is comfortable when sitting or lying down.  She does not take medications for this pain.  She is status post right hip replacement.  This pain seems different.              ROS: Otherwise noncontributory, no fevers or chills, no bowel or bladder dysfunction.  Objective: Vital Signs: There were no vitals taken for this visit.  Physical Exam:  Low back: Slightly tender in the midline at the L5-S1 level, slightly tender in the left sciatic notch.  No pain over the SI joint, no pain with internal hip rotation.  Lower extremity strength and reflexes are normal.  Imaging: None today.  Assessment & Plan: 1.  Left posterior hip and leg pain, suspicious for lumbar foraminal stenosis. -Discussed options with patient, she wants to try physical therapy.  If not improving in the next 3 to 4 weeks she will come back for lumbar x-rays and possibly scheduling of MRI scan.  She did not want any new medications.     Procedures: No procedures performed  No notes on file     PMFS History: Patient Active Problem List   Diagnosis Date Noted  . Swelling of calf 02/04/2018  . Bad odor of urine 12/09/2017  . Iron deficiency anemia due to chronic blood  loss 08/14/2017  . Iron malabsorption 08/14/2017  . Hyperlipidemia LDL goal <100 07/08/2017  . Elevated ALT measurement   . Hypotension 06/20/2017  . HLD (hyperlipidemia) 06/20/2017  . CAD (coronary artery disease) 06/20/2017  . Protein-calorie malnutrition, severe ([place]) 06/20/2017  . Generalized weakness   . Weakness 05/30/2017  . Focal neurological deficit 05/30/2017  . Chronic diastolic CHF (congestive heart failure) ([place]) 05/30/2017  . TIA (transient ischemic attack) 05/30/2017  . AKI (acute kidney injury) ([place]) 05/30/2017  . Abnormal LFTs 05/30/2017  . Bronchiectasis ([place]) 04/22/2017  . Right otitis media 07/17/2016  . Cerumen impaction 07/17/2016  . Osteoporosis 03/16/2016  . Headache 11/20/2014  . Skin infection 05/27/2014  . Insomnia 05/27/2014  . Abdominal wall pain 07/09/2013  . Other dysphagia 02/02/2013  . Platelets decreased ([place]) 01/29/2012  . Vitamin D deficiency 12/13/2011  . Hypokalemia 12/13/2011  . Hypocalcemia 12/12/2011  . Adhesions of intestine, very dense, with frozen abdomen 12/11/2011  . SBO (small bowel obstruction), recurrent 12/11/2011  . Protein-calorie malnutrition, moderate ([place]) 12/11/2011  . Oxygen dependent   . Hx of gastric ulcer   . UTI (urinary tract infection) 10/15/2011  . Allergic conjunctivitis 10/15/2011  . Degenerative arthritis of hip 08/03/2011  . B12 deficiency 07/02/2011  . Bilateral leg weakness  01/16/2011  . Anemia, chronic disease 09/14/2010  . Recurrent pneumonia 08/11/2010  . CRT- Medtronic 06/26/2010  . DYSURIA 05/19/2010  . Chronic hypoxemic respiratory failure ([place]) 02/21/2010  . DEHYDRATION 01/06/2010  . GASTROENTERITIS 01/06/2010  . Congestive dilated cardiomyopathy ([place]) 11/17/2009  . LBBB 11/17/2009  . SYSTOLIC HEART FAILURE, CHRONIC 11/17/2009  . GERD (gastroesophageal reflux disease) 02/17/2009  . NONSPECIFIC ABN FINDNG RAD&OTH EXAM BILARY TRCT 09/03/2008  . COPD with emphysema ([place]) 06/28/2008  .  DIVERTICULOSIS, COLON 06/03/2008  . GASTRITIS 06/02/2008  . Atrial fibrillation/flutter 12/08/2007  . GASTROPARESIS 08/07/2007  . NAUSEA, CHRONIC 08/07/2007  . Abdominal pain 08/07/2007  . SYNCOPE 02/17/2007  . RESTLESS LEG SYNDROME 08/01/2006  . Chronic pain syndrome 08/01/2006  . OSTEOARTHRITIS 07/31/2006  . Degenerative disc disease, lumbar 07/31/2006  . FIBROMYALGIA 07/31/2006  . CHRONIC FATIGUE SYNDROME 07/31/2006  . URINARY INCONTINENCE 07/31/2006  . ANEMIA-IRON DEFICIENCY 05/01/2006  . Essential hypertension 05/01/2006   Past Medical History:  Diagnosis Date  . Anemia   . Arthritis    right hip  . Atrial fibrillation ([place])   . CAD (coronary artery disease)    mild disease per cath in 2011  . CHF (congestive heart failure) ([place])   . COPD (chronic obstructive pulmonary disease) ([facility])    continuous O2- 2l  . Diverticulitis   . Elbow fracture, left aug 2012  . Fibromyalgia    COUPLE OF TIMES A YEAR-PT HAS EPISODES OF CONFUSION-USUALLY INVOLVES DAY/NIGHT REVERSAL AND EPISODES OF TWITCHING AND FALLS--SEES DR. Jacelyn Grip - NEUROLOGIST-LAST SEEN 07/10/11  . Gastroparesis   . GERD (gastroesophageal reflux disease)   . H/O: GI bleed    from Pradaxa  . Hepatitis    hx of in high school   . HTN (hypertension)    under control; has been on med. x "years"  . Hx MRSA infection   . Hx of gastric ulcer   . Hx of stress fx aug 2011   right hip   . Iron malabsorption 08/14/2017  . LBBB (left bundle branch block)   . Oxygen dependent    2 liters via nasal cannula at all times  . Pacemaker    CRT therapy; followed by Dr. Caryl Comes  . Pleural effusion     s/p right thoracentesis 03/09  . Pneumonia - 03/09, 12/09, 02/10, 06/10   MOST RECENT FEB 2013  . Primary dilated cardiomyopathy ([facility])    EF 45 to 50% per echo in Jan 2012  . RLS (restless legs syndrome)   . Silent aspiration   . Small bowel obstruction ([place])   . Urinary incontinence     -INTERSTIM IMPLANT NOT FUNCTIONING PER PT  .  Venous embolism and thrombosis of subclavian vein ([place])    after pacemaker insertion Oct 2011  . Vitamin B12 deficiency     Family History  Problem Relation Age of Onset  . Heart disease Father   . Stroke Mother   . Heart disease Mother   . Heart disease Brother   . Breast cancer Maternal Aunt   . Heart disease Paternal Aunt   . Diabetes Paternal Grandfather   . Colon cancer Neg Hx     Past Surgical History:  Procedure Laterality Date  . APPENDECTOMY    . CARDIAC CATHETERIZATION  04/08/2006, 11/16/2009  . CHOLECYSTECTOMY N/A 06/27/2017   Procedure: OPEN CHOLECYSTECTOMY;  Surgeon: Ralene Ok, MD;  Location: [facility];  Service: General;  Laterality: N/A;  . COLECTOMY     Intestinal resection (5times)  .  COLECTOMY  02/04/2000   ex. lap., intra-abd. subtotal colectomy with ileosigmoid colon anastomosies and lysis of adhesions  . CYSTOSCOPY  11/11/2008  . CYSTOSCOPY W/ RETROGRADES  11/11/2008   right  . CYSTOSCOPY WITH INJECTION  04/30/2006   transurethral collagen injection; incision vaginal stenosis  . ERCP N/A 06/23/2017   Procedure: ENDOSCOPIC RETROGRADE CHOLANGIOPANCREATOGRAPHY (ERCP);  Surgeon: Carol Ada, MD;  Location: [facility];  Service: Endoscopy;  Laterality: N/A;  . ERCP N/A 06/24/2017   Procedure: ENDOSCOPIC RETROGRADE CHOLANGIOPANCREATOGRAPHY (ERCP);  Surgeon: [name], MD;  [facility];  Service: Endoscopy;  Laterality: N/A;  . ESOPHAGOGASTRODUODENOSCOPY N/A 02/02/2013   Procedure: ESOPHAGOGASTRODUODENOSCOPY (EGD);  Surgeon: Lafayette Dragon, MD;  Location: [facility];  Service: Endoscopy;  Laterality: N/A;  . EXPLORATORY LAPAROTOMY  04/27/2009   lysis of adhesions, gastrostomy tube  . GASTROCUTANEOUS FISTULA CLOSURE    . INTERSTIM IMPLANT PLACEMENT  05/28/2006 - stage I   06/05/2006 - stage II  . INTERSTIM IMPLANT PLACEMENT  02/05/2012   Procedure: [name] IMPLANT FIRST STAGE;  Surgeon: Reece Packer, MD;  [facility];  Service: Urology;   Laterality: N/A;  Replacement of Interstim Lead     . INTERSTIM IMPLANT REVISION  03/06/2011   Procedure: REVISION OF [name];  Surgeon: Reece Packer, MD;  [facility];  Service: Urology;  Laterality: N/A;  Replacement of Neurostimulator  . INTERSTIM IMPLANT REVISION  10/23/2007  . IR IMAGING GUIDED PORT INSERTION  10/14/2017  . PACEMAKER PLACEMENT  12/21/2009  . Peg removed     with complications  . PORT-A-CATH REMOVAL  07/27/2011   Procedure: REMOVAL PORT-A-CATH;  Surgeon: Rolm Bookbinder, MD;  Location: [facility];  Service: General;  Laterality: N/A;  . PORTACATH PLACEMENT  12/16/2009  . SAVORY DILATION N/A 02/02/2013   Procedure: SAVORY DILATION;  Surgeon: Lafayette Dragon, MD;  [facility];  Service: Endoscopy;  Laterality: N/A;  . SMALL INTESTINE SURGERY  05/20/2001   ex. lap., resection of small bowel stricture; gastrostomy; insertion central line  . TONSILLECTOMY  age 81  . TOTAL ABDOMINAL HYSTERECTOMY     complete  . TOTAL HIP ARTHROPLASTY  08/03/2011   Procedure: TOTAL HIP ARTHROPLASTY ANTERIOR APPROACH;  Surgeon: Mcarthur Rossetti, MD;  [facility];  Service: Orthopedics;  Laterality: Right;   Social History   Occupational History  . Occupation: disabled    Employer: RETIRED  Tobacco Use  . Smoking status: Former Smoker    Packs/day: 1.00    Years: 20.00    Pack years: 20.00    Last attempt to quit: 03/19/1986    Years since quitting: 32.1  . Smokeless tobacco: Never Used  Substance and Sexual Activity  . Alcohol use: No    Alcohol/week: 0.0 standard drinks  . Drug use: No  . Sexual activity: Yes

## 2018-05-14 NOTE — Telephone Encounter (Signed)
Request has already been pended at office for PCP review

## 2018-05-14 NOTE — Telephone Encounter (Signed)
Copied from [place] [phone]. Topic: Quick Communication - Rx Refill/Question >> May 14, 2018  8:19 AM Burchel, Abbi R wrote: Medication: zolpidem (AMBIEN) 5 MG tablet  Preferred Pharmacy: [facility], [place]  [phone] (Phone) [phone] (Fax)    Pt was advised that RX refills may take up to 3 business days. We ask that you follow-up with your pharmacy.

## 2018-05-14 NOTE — Telephone Encounter (Signed)
Addendum created in error.

## 2018-05-16 NOTE — Telephone Encounter (Signed)
Last written: 04/17/18 Last ov: 12/09/17 Next ov: 06/09/18 Contract: will get at 06/09/18 appt UDS: will get at 06/09/18 appt

## 2018-05-20 ENCOUNTER — Telehealth: Payer: Self-pay | Admitting: Pulmonary Disease

## 2018-05-20 ENCOUNTER — Other Ambulatory Visit: Payer: Self-pay | Admitting: Family Medicine

## 2018-05-20 DIAGNOSIS — J479 Bronchiectasis, uncomplicated: Secondary | ICD-10-CM

## 2018-05-20 DIAGNOSIS — J432 Centrilobular emphysema: Secondary | ICD-10-CM

## 2018-05-20 DIAGNOSIS — R52 Pain, unspecified: Secondary | ICD-10-CM

## 2018-05-20 NOTE — Telephone Encounter (Signed)
Copied from [place] [phone]. Topic: Quick Communication - Rx Refill/Question >> May 20, 2018  2:18 PM [name] wrote: Medication: oxyCODONE-acetaminophen (PERCOCET) 10-325 MG tablet   Patient is requesting refill of this medication   Preferred Pharmacy (with phone number or street name):[facility], [place]  [phone] (Phone) [phone] (Fax)

## 2018-05-20 NOTE — Telephone Encounter (Signed)
Called and spoke with patient she is requesting a new prescription of the albuterol inhaler. Patient stated it has been two years since she has had one and there are times when she starts coughing and she feels like this has helped her in the past. Patient stated that she is not sob and there are no other concerns or symptoms.    VS please advise thank you.

## 2018-05-21 MED ORDER — OXYCODONE-ACETAMINOPHEN 10-325 MG PO TABS: 1.0000 | ORAL_TABLET | ORAL | 0 refills | Status: DC | PRN

## 2018-05-21 NOTE — Telephone Encounter (Signed)
Refill request for oxycodone.   Last OV: 12/09/2017 Last Fill: 04/17/2018 #120 and 0RF UDS: 2018

## 2018-05-21 NOTE — Telephone Encounter (Signed)
Okay to send script for albuterol two puffs q6hrs prn cough, wheeze, chest congestion, or shortness of breath.

## 2018-05-22 MED ORDER — ALBUTEROL SULFATE HFA 108 (90 BASE) MCG/ACT IN AERS: 2.0000 | INHALATION_SPRAY | Freq: Four times a day (QID) | RESPIRATORY_TRACT | 3 refills | Status: DC | PRN

## 2018-05-22 NOTE — Telephone Encounter (Signed)
Called spoke with patient, she is aware of pending Ventolin Rx to verified pharmacy  While speaking with patient she did mention that she received a letter from [facility] stating that a prior auth is needed to her DME company.  Attempted to explain to patient that she may need an office visit with qualifying sats as this is typically required by insurance once per year.  Patient stated that this is not what she was told by insurance and that an order would be okay.  Order placed.  Patient aware that we will contact her if needed for an office visit.  Nothing further needed; will sign off

## 2018-05-27 ENCOUNTER — Ambulatory Visit: Payer: HMO | Admitting: Physical Therapy

## 2018-05-29 ENCOUNTER — Other Ambulatory Visit: Payer: Self-pay | Admitting: *Deleted

## 2018-05-29 ENCOUNTER — Ambulatory Visit (INDEPENDENT_AMBULATORY_CARE_PROVIDER_SITE_OTHER): Payer: HMO | Admitting: *Deleted

## 2018-05-29 DIAGNOSIS — I428 Other cardiomyopathies: Secondary | ICD-10-CM

## 2018-05-29 DIAGNOSIS — J439 Emphysema, unspecified: Secondary | ICD-10-CM

## 2018-05-29 NOTE — Patient Outreach (Addendum)
  [facility]) Care Management Chronic Special Needs Program  05/29/2018  Name: [name] DOB: August 30, 1945  MRN: [id number]  [name] is enrolled in a chronic special needs plan for  Heart Failure. A completed health risk assessment has not been received from the client and client has not responded to outreach attempts by their health care concierge.  The client's individualized care plan was developed based on available data.  Plan:  . Send unsuccessful outreach letter with a copy of individualized care plan to client . Send Neurosurgeon on HTN and COPD and an advance directive packet . Referral to [place] Management pharmacist for polypharmacy . Send individualized care plan to provider  Chronic care management coordinator, Peter Garter, will attempt outreach in 2-4 months.   Barrington Ellison RN,CCM,CDE [place] Management Coordinator Office Phone [phone] Office Fax [phone]

## 2018-05-30 ENCOUNTER — Telehealth: Payer: Self-pay | Admitting: Pulmonary Disease

## 2018-05-30 ENCOUNTER — Telehealth: Payer: Self-pay

## 2018-05-30 NOTE — Telephone Encounter (Signed)
Left message for patient to remind of missed remote transmission.  

## 2018-05-30 NOTE — Telephone Encounter (Signed)
Returned phone call to patient, she states her insurance does not want to cover her oxygen equipment and they are asking for  a PA request for medical oxygen equipment. Call made to providers services [phone].  Call made to [facility] at [facility] she is going to send over a message to their PA team to have them address this. She states she will call us back if anything else is needed. Nothing further is needed at this time.

## 2018-05-31 LAB — CUP PACEART REMOTE DEVICE CHECK
Battery Remaining Longevity: 9 mo
Battery Voltage: 2.85 V
Brady Statistic AP VP Percent: 0.72 %
Brady Statistic AP VS Percent: 0 %
Brady Statistic AS VP Percent: 99.24 %
Brady Statistic AS VS Percent: 0.04 %
Brady Statistic RA Percent Paced: 0.72 %
Brady Statistic RV Percent Paced: 99.95 %
Date Time Interrogation Session: 20200314003334
Implantable Lead Implant Date: 20111005
Implantable Lead Implant Date: 20111005
Implantable Lead Implant Date: 20111005
Implantable Lead Location: 753858
Implantable Lead Location: 753859
Implantable Lead Location: 753860
Implantable Lead Model: 4196
Implantable Lead Model: 5076
Implantable Lead Model: 5076
Implantable Pulse Generator Implant Date: 20111005
Lead Channel Impedance Value: 304 Ohm
Lead Channel Impedance Value: 380 Ohm
Lead Channel Impedance Value: 418 Ohm
Lead Channel Impedance Value: 456 Ohm
Lead Channel Impedance Value: 456 Ohm
Lead Channel Impedance Value: 494 Ohm
Lead Channel Impedance Value: 513 Ohm
Lead Channel Impedance Value: 532 Ohm
Lead Channel Impedance Value: 722 Ohm
Lead Channel Pacing Threshold Amplitude: 0.5 V
Lead Channel Pacing Threshold Amplitude: 0.5 V
Lead Channel Pacing Threshold Pulse Width: 0.4 ms
Lead Channel Pacing Threshold Pulse Width: 0.4 ms
Lead Channel Sensing Intrinsic Amplitude: 0.75 mV
Lead Channel Sensing Intrinsic Amplitude: 0.75 mV
Lead Channel Sensing Intrinsic Amplitude: 6.25 mV
Lead Channel Sensing Intrinsic Amplitude: 8 mV
Lead Channel Setting Pacing Amplitude: 1.25 V
Lead Channel Setting Pacing Amplitude: 2 V
Lead Channel Setting Pacing Amplitude: 2.5 V
Lead Channel Setting Pacing Pulse Width: 0.4 ms
Lead Channel Setting Pacing Pulse Width: 0.8 ms
Lead Channel Setting Sensing Sensitivity: 0.9 mV

## 2018-06-03 ENCOUNTER — Encounter: Payer: Self-pay | Admitting: Family Medicine

## 2018-06-03 ENCOUNTER — Telehealth: Payer: Self-pay | Admitting: Pharmacist

## 2018-06-03 NOTE — Telephone Encounter (Signed)
-----   Message from Jiles Harold sent at 05/29/2018  1:34 PM EDT ----- Regarding: Referral: Order for Selinda Orion (Referral for Denyse Amass, RN  Referral from Kelli Churn, RN  "Please see below request"  Forde Radon ----- Message ----- From: Barrington Ellison, RN Sent: 05/29/2018   1:29 PM EDT To: Thn Cm Communication Orders Subject: Order for [facility]                       Patient Name: [name], [name]([id number]) Sex: Female DOB: 15-Mar-1946    PCP: Carollee Herter, [place]:  GASTROENTEROLOGY   Types of orders made on 05/29/2018: Nursing  Order Date:05/29/2018 Ordering User:HAUSER, Trude Mcburney [[id number]] Encounter Provider:Hauser, Trude Mcburney, RN [phone] Authorizing  Provider: Carollee Herter, Alferd Apa, DO [[id number]] Department:THN-LINK TO [place]  Order Specific Information Order: Comm to Pharmacy [Custom: [id number]]  Order #: [id number] Qty: 1   Priority: Routine  Class: Clinic Performed   Comment:C-SNP member   Associated Diagnoses     J43.9 Pulmonary emphysema, unspecified emphysema type ([place])     Reason for Consult -> Polypharmacy     Disposition:  Return in about 3 months (around 08/29/2018).   Priority: Routine  Class: Clinic Performed   Comment:C-SNP member   Associated Diagnoses     J43.9 Pulmonary emphysema, unspecified emphysema type ([place])     Reason for Consult -> Polypharmacy

## 2018-06-03 NOTE — Patient Outreach (Signed)
[facility]) Care Management  06/03/2018  [name] Nov 28, 1945 [id number]  Patient was called regarding medication assistance. Unfortunately, she did not answer the phone as her phone was busy multiple times. HIPAA compliant message could not be left.  Plan: Send patient an unsuccessful contact letter. Call patient back in 10-14 business days.

## 2018-06-05 ENCOUNTER — Encounter: Payer: Self-pay | Admitting: Cardiology

## 2018-06-05 NOTE — Progress Notes (Signed)
Remote pacemaker transmission.  Battery check only

## 2018-06-05 NOTE — Addendum Note (Signed)
Addended by: Tiajuana Amass on: 06/05/2018 11:54 AM   Modules accepted: Level of Service

## 2018-06-08 ENCOUNTER — Other Ambulatory Visit: Payer: Self-pay | Admitting: Family Medicine

## 2018-06-08 DIAGNOSIS — R52 Pain, unspecified: Secondary | ICD-10-CM

## 2018-06-09 ENCOUNTER — Encounter: Payer: HMO | Admitting: Family Medicine

## 2018-06-09 ENCOUNTER — Encounter: Payer: PPO | Admitting: Family Medicine

## 2018-06-09 ENCOUNTER — Telehealth (INDEPENDENT_AMBULATORY_CARE_PROVIDER_SITE_OTHER): Payer: Self-pay

## 2018-06-09 NOTE — Telephone Encounter (Signed)
Called patient and asked the screening questions.  Do you have now or have you had in the past 7 days a fever and/or chills? NO  Do you have now or have you had in the past 7 days a cough? NO-COPD  Do you have now or have you had in the last 7 days nausea, vomiting or abdominal pain? NO  Have you been exposed to anyone who has tested positive for COVID-19? NO  Have you or anyone who lives with you traveled within the last month? NO

## 2018-06-10 ENCOUNTER — Other Ambulatory Visit: Payer: Self-pay

## 2018-06-10 ENCOUNTER — Ambulatory Visit (INDEPENDENT_AMBULATORY_CARE_PROVIDER_SITE_OTHER): Payer: HMO

## 2018-06-10 ENCOUNTER — Ambulatory Visit (INDEPENDENT_AMBULATORY_CARE_PROVIDER_SITE_OTHER): Payer: HMO | Admitting: Orthopaedic Surgery

## 2018-06-10 ENCOUNTER — Encounter (INDEPENDENT_AMBULATORY_CARE_PROVIDER_SITE_OTHER): Payer: Self-pay | Admitting: Orthopaedic Surgery

## 2018-06-10 DIAGNOSIS — M5440 Lumbago with sciatica, unspecified side: Secondary | ICD-10-CM

## 2018-06-10 MED ORDER — METHYLPREDNISOLONE 4 MG PO TABS: ORAL_TABLET | ORAL | 0 refills | Status: DC

## 2018-06-10 MED ORDER — CYCLOBENZAPRINE HCL 10 MG PO TABS: 10.0000 mg | ORAL_TABLET | Freq: Three times a day (TID) | ORAL | 1 refills | Status: DC | PRN

## 2018-06-10 NOTE — Progress Notes (Signed)
Office Visit Note   Patient: [name]           Date of Birth: Aug 12, 1945           MRN: [id number] Visit Date: 06/10/2018              Requested by: [address], [place], [place] [address] [place], [place] [address] PCP: Carollee Herter, Alferd Apa, DO   Assessment & Plan: Visit Diagnoses:  1. Acute right-sided low back pain with sciatica, sciatica laterality unspecified     Plan: She is given exercises to do at home for low back knees are gone over with her.  Also place her on a Medrol Dosepak and Flexeril.  She will call our office if she does not improve in the next 2 weeks we will obtain an MRI for epidural steroid planning.  Questions encouraged and answered at length.  She will return sooner if her condition dramatically becomes worse.  Follow-Up Instructions: Return if symptoms worsen or fail to improve.   Orders:  Orders Placed This Encounter  Procedures  . XR Lumbar Spine 2-3 Views   Meds ordered this encounter  Medications  . methylPREDNISolone (MEDROL) 4 MG tablet    Sig: Take as directed    Dispense:  21 tablet    Refill:  0  . cyclobenzaprine (FLEXERIL) 10 MG tablet    Sig: Take 1 tablet (10 mg total) by mouth 3 (three) times daily as needed for muscle spasms.    Dispense:  40 tablet    Refill:  1      Procedures: No procedures performed   Clinical Data: No additional findings.   Subjective: Chief Complaint  Patient presents with  . Lower Back - Pain    HPI Patient is 73 year old female with left hip and left thigh pain.  Describes her pain as a sharp pain.  Denies any numbness tingling.  No saddle anesthesia like symptoms.  She does have some low back pain.  She saw Dr. [name] on 05/14/2018 and was referred to physical therapy however she was unable to go to therapy due to the recent COVID 19 virus.  States that her back pain is becoming worse.  She has had no change in bowel bladder function she does suffer from some chronic urinary  incontinence.  She denies any pain involving the right lower extremity.  She denies any groin pain. Review of Systems Negative for fevers chills  Objective: Vital Signs: There were no vitals taken for this visit.  Physical Exam Constitutional:      Appearance: She is underweight. She is not diaphoretic.  Pulmonary:     Effort: Pulmonary effort is normal.  Neurological:     Mental Status: She is alert and oriented to person, place, and time.  Psychiatric:        Mood and Affect: Mood normal.     Ortho Exam 5 out of 5 strength throughout lower extremities against resistance.  Dorsiflexion plantarflexion ankles intact bilaterally.  Good range of motion bilateral hips without pain.  Tenderness over the left trochanteric region no tenderness around the IT band.  Positive straight leg raise on the left negative on the right.  Specialty Comments:  No specialty comments available.  Imaging: Xr Lumbar Spine 2-3 Views  Result Date: 06/10/2018 AP lumbar and lateral: No acute fractures did space overall well-maintained.  Grade 1 anterior spondylolisthesis L4 on L5.  Slight scoliosis.  Significant arthrosclerosis of the aorta.  Bilateral hips are seen on the AP view left hip joint is well-maintained.  No obvious hardware failure right total hip arthroplasty.    PMFS History: Patient Active Problem List   Diagnosis Date Noted  . Swelling of calf 02/04/2018  . Bad odor of urine 12/09/2017  . Iron deficiency anemia due to chronic blood loss 08/14/2017  . Iron malabsorption 08/14/2017  . Hyperlipidemia LDL goal <100 07/08/2017  . Elevated ALT measurement   . Hypotension 06/20/2017  . HLD (hyperlipidemia) 06/20/2017  . CAD (coronary artery disease) 06/20/2017  . Protein-calorie malnutrition, severe ([place]) 06/20/2017  . Generalized weakness   . Weakness 05/30/2017  . Focal neurological deficit 05/30/2017  . Chronic diastolic CHF (congestive heart failure) ([place]) 05/30/2017  . TIA  (transient ischemic attack) 05/30/2017  . AKI (acute kidney injury) ([place]) 05/30/2017  . Abnormal LFTs 05/30/2017  . Bronchiectasis ([place]) 04/22/2017  . Right otitis media 07/17/2016  . Cerumen impaction 07/17/2016  . Osteoporosis 03/16/2016  . Headache 11/20/2014  . Skin infection 05/27/2014  . Insomnia 05/27/2014  . Abdominal wall pain 07/09/2013  . Other dysphagia 02/02/2013  . Platelets decreased ([place]) 01/29/2012  . Vitamin D deficiency 12/13/2011  . Hypokalemia 12/13/2011  . Hypocalcemia 12/12/2011  . Adhesions of intestine, very dense, with frozen abdomen 12/11/2011  . SBO (small bowel obstruction), recurrent 12/11/2011  . Protein-calorie malnutrition, moderate ([place]) 12/11/2011  . Oxygen dependent   . Hx of gastric ulcer   . UTI (urinary tract infection) 10/15/2011  . Allergic conjunctivitis 10/15/2011  . Degenerative arthritis of hip 08/03/2011  . B12 deficiency 07/02/2011  . Bilateral leg weakness 01/16/2011  . Anemia, chronic disease 09/14/2010  . Recurrent pneumonia 08/11/2010  . CRT- [employer] 06/26/2010  . DYSURIA 05/19/2010  . Chronic hypoxemic respiratory failure ([place]) 02/21/2010  . DEHYDRATION 01/06/2010  . GASTROENTERITIS 01/06/2010  . Congestive dilated cardiomyopathy ([place]) 11/17/2009  . LBBB 11/17/2009  . SYSTOLIC HEART FAILURE, CHRONIC 11/17/2009  . GERD (gastroesophageal reflux disease) 02/17/2009  . NONSPECIFIC ABN FINDNG RAD&OTH EXAM BILARY TRCT 09/03/2008  . COPD with emphysema ([place]) 06/28/2008  . DIVERTICULOSIS, COLON 06/03/2008  . GASTRITIS 06/02/2008  . Atrial fibrillation/flutter 12/08/2007  . GASTROPARESIS 08/07/2007  . NAUSEA, CHRONIC 08/07/2007  . Abdominal pain 08/07/2007  . SYNCOPE 02/17/2007  . RESTLESS LEG SYNDROME 08/01/2006  . Chronic pain syndrome 08/01/2006  . OSTEOARTHRITIS 07/31/2006  . Degenerative disc disease, lumbar 07/31/2006  . FIBROMYALGIA 07/31/2006  . CHRONIC FATIGUE SYNDROME 07/31/2006  . URINARY INCONTINENCE  07/31/2006  . ANEMIA-IRON DEFICIENCY 05/01/2006  . Essential hypertension 05/01/2006   Past Medical History:  Diagnosis Date  . Anemia   . Arthritis    right hip  . Atrial fibrillation ([place])   . CAD (coronary artery disease)    mild disease per cath in 2011  . CHF (congestive heart failure) ([place])   . COPD (chronic obstructive pulmonary disease) ([facility])    continuous O2- 2l  . Diverticulitis   . Elbow fracture, left aug 2012  . Fibromyalgia    COUPLE OF TIMES A YEAR-PT HAS EPISODES OF CONFUSION-USUALLY INVOLVES DAY/NIGHT REVERSAL AND EPISODES OF TWITCHING AND FALLS--SEES DR. Jacelyn Grip - NEUROLOGIST-LAST SEEN 07/10/11  . Gastroparesis   . GERD (gastroesophageal reflux disease)   . H/O: GI bleed    from Pradaxa  . Hepatitis    hx of in high school   . HTN (hypertension)    under control; has been on med. x "years"  . Hx MRSA infection   . Hx of  gastric ulcer   . Hx of stress fx aug 2011   right hip   . Iron malabsorption 08/14/2017  . LBBB (left bundle branch block)   . Oxygen dependent    2 liters via nasal cannula at all times  . Pacemaker    CRT therapy; followed by Dr. Caryl Comes  . Pleural effusion     s/p right thoracentesis 03/09  . Pneumonia - 03/09, 12/09, 02/10, 06/10   MOST RECENT FEB 2013  . Primary dilated cardiomyopathy (H[facility])    EF 45 to 50% per echo in Jan 2012  . RLS (restless legs syndrome)   . Silent aspiration   . Small bowel obstruction ([place])   . Urinary incontinence     -INTERSTIM IMPLANT NOT FUNCTIONING PER PT  . Venous embolism and thrombosis of subclavian vein ([place])    after pacemaker insertion Oct 2011  . Vitamin B12 deficiency     Family History  Problem Relation Age of Onset  . Heart disease Father   . Stroke Mother   . Heart disease Mother   . Heart disease Brother   . Breast cancer Maternal Aunt   . Heart disease Paternal Aunt   . Diabetes Paternal Grandfather   . Colon cancer Neg Hx     Past Surgical History:  Procedure Laterality Date  .  APPENDECTOMY    . CARDIAC CATHETERIZATION  04/08/2006, 11/16/2009  . CHOLECYSTECTOMY N/A 06/27/2017   Procedure: OPEN CHOLECYSTECTOMY;  Surgeon: Ralene Ok, MD;  Location: [facility];  Service: General;  Laterality: N/A;  . COLECTOMY     Intestinal resection (5times)  . COLECTOMY  02/04/2000   ex. lap., intra-abd. subtotal colectomy with ileosigmoid colon anastomosies and lysis of adhesions  . CYSTOSCOPY  11/11/2008  . CYSTOSCOPY W/ RETROGRADES  11/11/2008   right  . CYSTOSCOPY WITH INJECTION  04/30/2006   transurethral collagen injection; incision vaginal stenosis  . ERCP N/A 06/23/2017   Procedure: ENDOSCOPIC RETROGRADE CHOLANGIOPANCREATOGRAPHY (ERCP);  Surgeon: Carol Ada, MD;  Location: [facility];  Service: Endoscopy;  Laterality: N/A;  . ERCP N/A 06/24/2017   Procedure: ENDOSCOPIC RETROGRADE CHOLANGIOPANCREATOGRAPHY (ERCP);  Surgeon: [name], MD;  [facility] ENDOSCOPY;  Service: Endoscopy;  Laterality: N/A;  . ESOPHAGOGASTRODUODENOSCOPY N/A 02/02/2013   Procedure: ESOPHAGOGASTRODUODENOSCOPY (EGD);  Surgeon: Lafayette Dragon, MD;  Location: [facility];  Service: Endoscopy;  Laterality: N/A;  . EXPLORATORY LAPAROTOMY  04/27/2009   lysis of adhesions, gastrostomy tube  . GASTROCUTANEOUS FISTULA CLOSURE    . INTERSTIM IMPLANT PLACEMENT  05/28/2006 - stage I   06/05/2006 - stage II  . INTERSTIM IMPLANT PLACEMENT  02/05/2012   Procedure: [name] IMPLANT FIRST STAGE;  Surgeon: Reece Packer, MD;  Location: [facility];  Service: Urology;  Laterality: N/A;  Replacement of Interstim Lead     . INTERSTIM IMPLANT REVISION  03/06/2011   Procedure: REVISION OF [name];  Surgeon: Reece Packer, MD;  Location: [facility];  Service: Urology;  Laterality: N/A;  Replacement of Neurostimulator  . INTERSTIM IMPLANT REVISION  10/23/2007  . IR IMAGING GUIDED PORT INSERTION  10/14/2017  . PACEMAKER PLACEMENT  12/21/2009  . Peg removed     with complications  . PORT-A-CATH REMOVAL  07/27/2011    Procedure: REMOVAL PORT-A-CATH;  Surgeon: Rolm Bookbinder, MD;  Location: [facility];  Service: General;  Laterality: N/A;  . PORTACATH PLACEMENT  12/16/2009  . SAVORY DILATION N/A 02/02/2013   Procedure: SAVORY DILATION;  Surgeon: Lafayette Dragon, MD;  Location: [facility];  Service: Endoscopy;  Laterality: N/A;  . SMALL INTESTINE SURGERY  05/20/2001   ex. lap., resection of small bowel stricture; gastrostomy; insertion central line  . TONSILLECTOMY  age 33  . TOTAL ABDOMINAL HYSTERECTOMY     complete  . TOTAL HIP ARTHROPLASTY  08/03/2011   Procedure: TOTAL HIP ARTHROPLASTY ANTERIOR APPROACH;  Surgeon: Mcarthur Rossetti, MD;  Location: [facility];  Service: Orthopedics;  Laterality: Right;   Social History   Occupational History  . Occupation: disabled    Employer: RETIRED  Tobacco Use  . Smoking status: Former Smoker    Packs/day: 1.00    Years: 20.00    Pack years: 20.00    Last attempt to quit: 03/19/1986    Years since quitting: 32.2  . Smokeless tobacco: Never Used  Substance and Sexual Activity  . Alcohol use: No    Alcohol/week: 0.0 standard drinks  . Drug use: No  . Sexual activity: Yes

## 2018-06-11 DIAGNOSIS — J449 Chronic obstructive pulmonary disease, unspecified: Secondary | ICD-10-CM | POA: Diagnosis not present

## 2018-06-11 DIAGNOSIS — I4891 Unspecified atrial fibrillation: Secondary | ICD-10-CM | POA: Diagnosis not present

## 2018-06-12 ENCOUNTER — Other Ambulatory Visit: Payer: Self-pay | Admitting: Family Medicine

## 2018-06-12 ENCOUNTER — Telehealth: Payer: Self-pay | Admitting: Pulmonary Disease

## 2018-06-12 ENCOUNTER — Ambulatory Visit: Payer: PPO | Admitting: Cardiology

## 2018-06-12 DIAGNOSIS — G459 Transient cerebral ischemic attack, unspecified: Secondary | ICD-10-CM

## 2018-06-12 DIAGNOSIS — G4700 Insomnia, unspecified: Secondary | ICD-10-CM

## 2018-06-12 NOTE — Telephone Encounter (Signed)
LMTCB x1 for pt.  

## 2018-06-12 NOTE — Telephone Encounter (Signed)
Requesting: Ambien  Contract: N/A UDS: N/A Last OV: 12/09/2017 Next OV: 08/04/2018 Last Refill: 05/16/2018,#30--0 RF Database:   Please advise

## 2018-06-13 IMAGING — DX DG CHEST 2V
2 series · 2 of 2 positions shown · non-contrast
Comparison: Chest radiographs, 06/20/2017 and 03/21/2017. Chest CT,
03/28/2017.

CLINICAL DATA: Cough, congestion, sob x 3 wks, HTN, x-smoker, hx of
emphysema/copd, bronchiectasis, pacemaker.

EXAM:
CHEST - 2 VIEW

[chest pa]
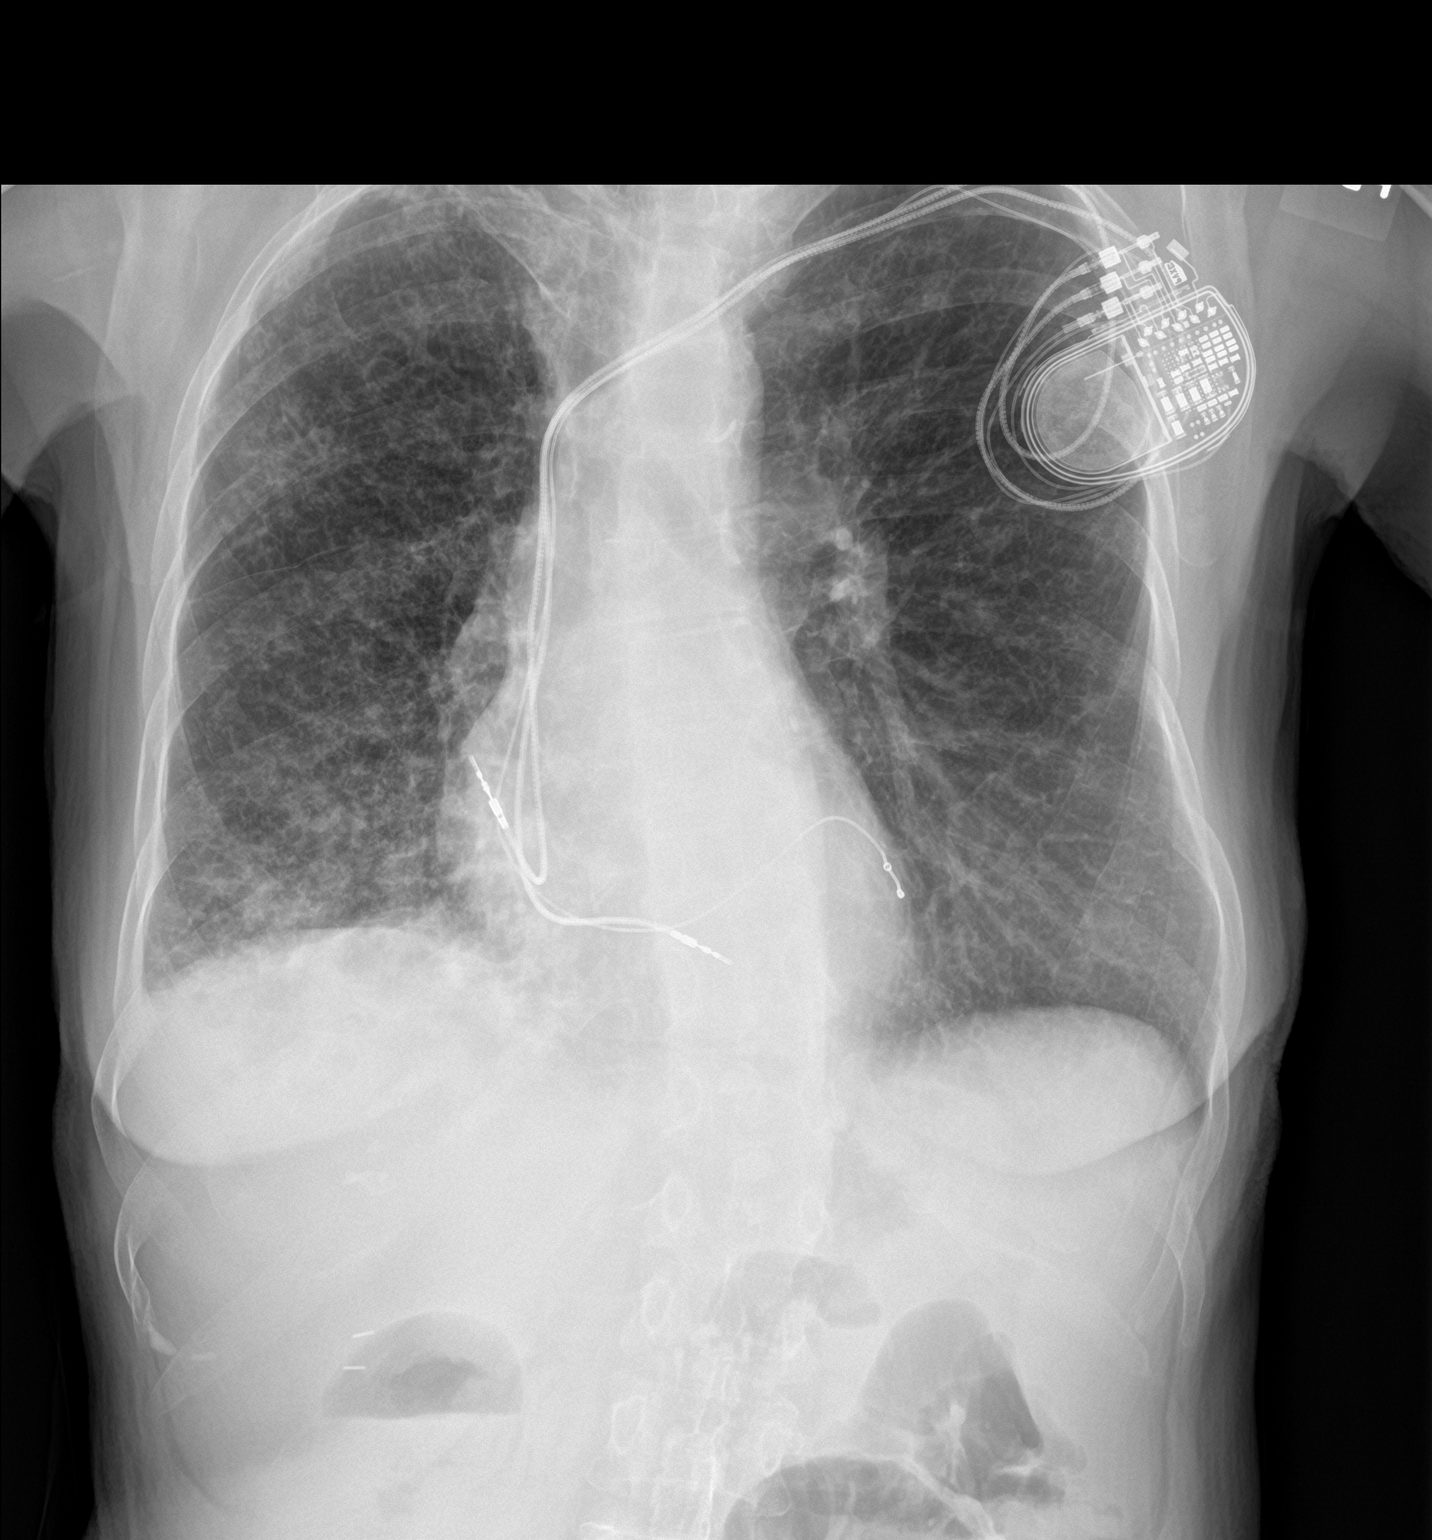

[chest lat]
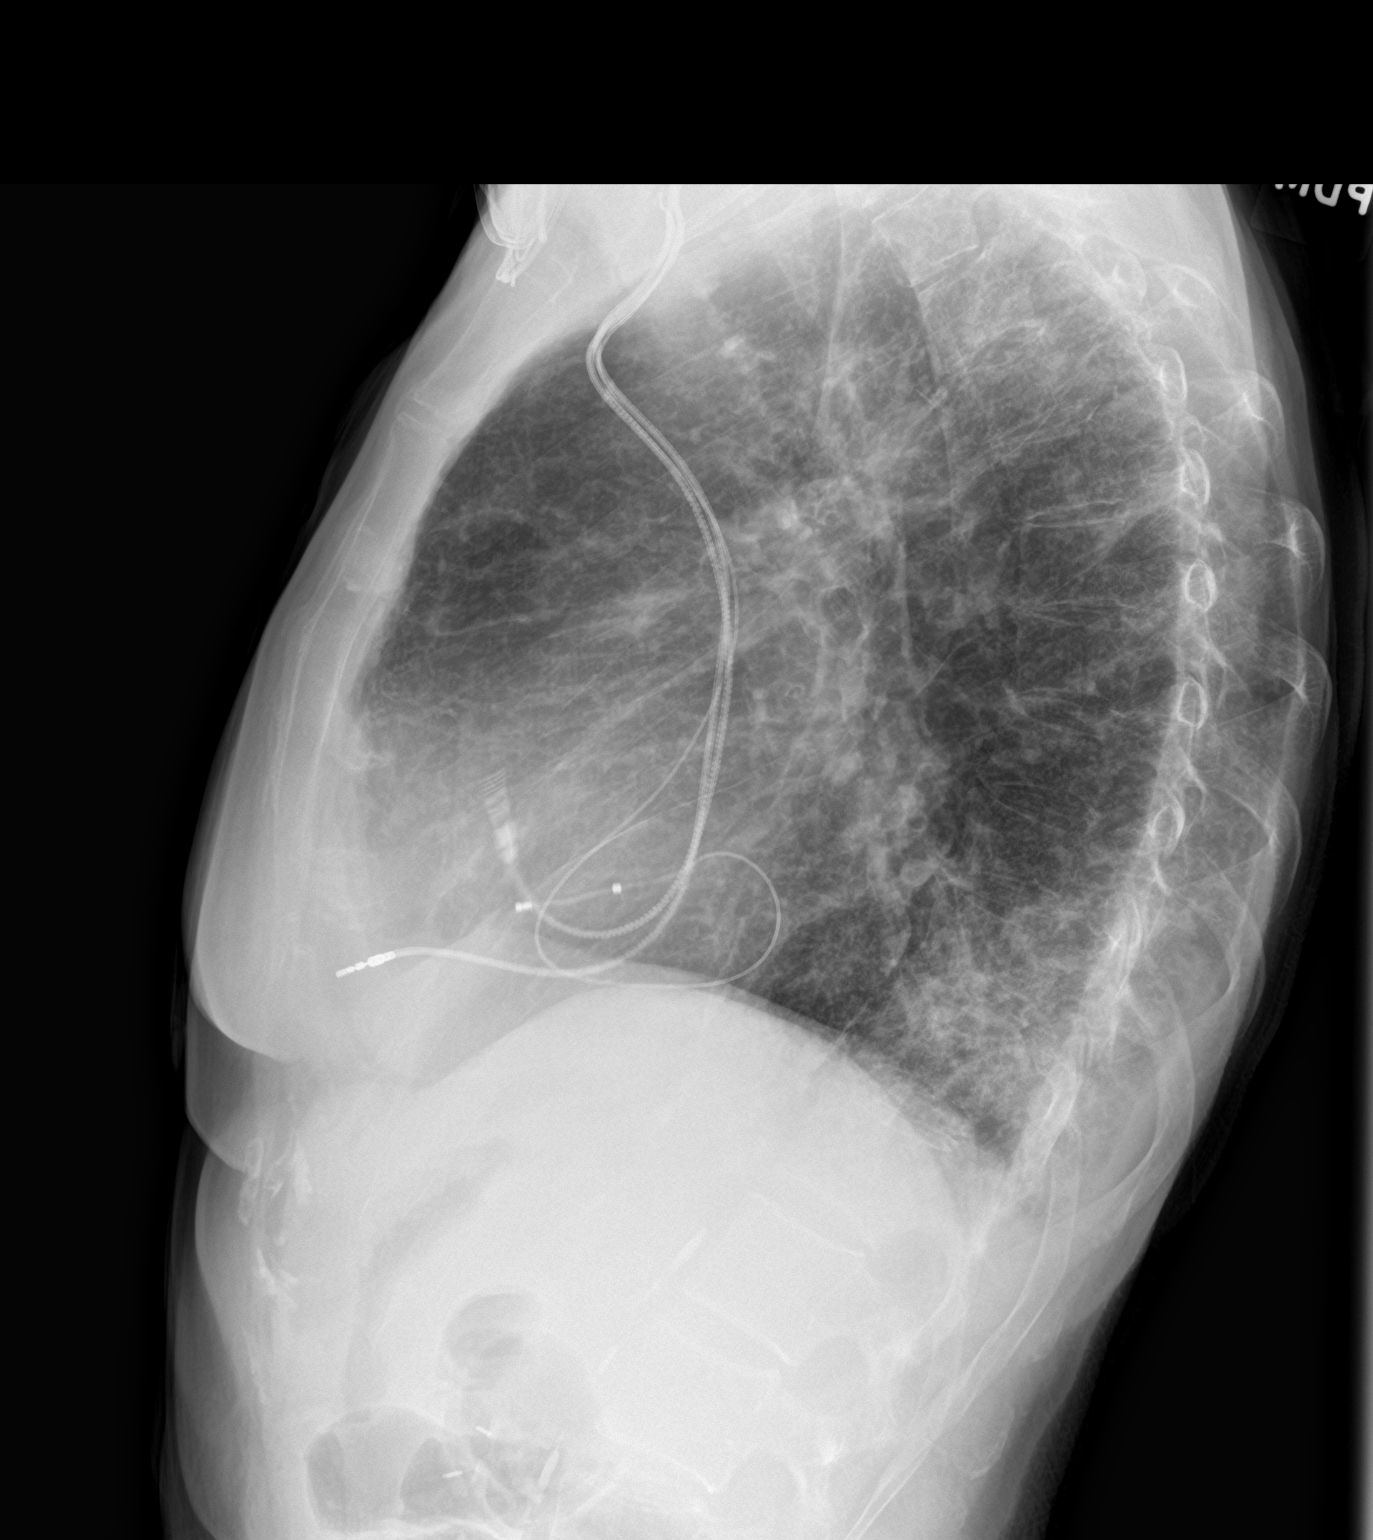

[2 of 2 positions shown; findings below may reference images not displayed]

FINDINGS: There is new focal opacity at the posterior base of the right lower
lobe consistent with pneumonia. This is superimposed on chronic
irregularly thickened interstitial markings, greater on the right
than the left, felt likely to be due to chronic bronchiolitis from
atypical infection.

No pleural effusion.  No pneumothorax.

Cardiac silhouette is normal in size. Left anterior chest wall
biventricular cardioverter-defibrillator is stable. No mediastinal
or hilar masses. No convincing adenopathy.

Skeletal structures are grossly intact.
IMPRESSION: 1. Acute right lower lobe pneumonia superimposed on the previously
described chronic changes, which are greater on the right, suspected
to be bronchiolitis due to atypical infection.

## 2018-06-16 ENCOUNTER — Other Ambulatory Visit: Payer: Self-pay | Admitting: Family Medicine

## 2018-06-16 DIAGNOSIS — R52 Pain, unspecified: Secondary | ICD-10-CM

## 2018-06-16 NOTE — Telephone Encounter (Signed)
Call made to patient, she states her insurance will not cover the albuterol. They did give her a month supply.   PA submitted via Cover my Meds. Will route to [facility] as FYI to hold until a decision is made.   Key: [id number]

## 2018-06-17 MED ORDER — OXYCODONE-ACETAMINOPHEN 10-325 MG PO TABS: 1.0000 | ORAL_TABLET | ORAL | 0 refills | Status: DC | PRN

## 2018-06-17 NOTE — Telephone Encounter (Signed)
Pt is requesting refill on oxycodone.   Last OV: 12/09/2017 Last Fill: 05/21/2018 #120 and 0RF UDS: 03/07/2017 Low risk

## 2018-06-17 NOTE — Telephone Encounter (Signed)
As of now PA is still waiting approval.

## 2018-06-18 NOTE — Telephone Encounter (Signed)
[name] (Key: [id number])  Need help? Call us at [phone]   Status  Sent to [place] March 30  Next Steps  The plan will fax you a determination, typically within 1 to 5 business days.

## 2018-06-19 ENCOUNTER — Inpatient Hospital Stay: Payer: HMO | Attending: Family | Admitting: Hematology & Oncology

## 2018-06-19 ENCOUNTER — Inpatient Hospital Stay: Payer: HMO

## 2018-06-19 ENCOUNTER — Telehealth (INDEPENDENT_AMBULATORY_CARE_PROVIDER_SITE_OTHER): Payer: Self-pay | Admitting: Physician Assistant

## 2018-06-19 ENCOUNTER — Other Ambulatory Visit: Payer: Self-pay

## 2018-06-19 ENCOUNTER — Encounter: Payer: Self-pay | Admitting: Hematology & Oncology

## 2018-06-19 VITALS — BP 92/66 | HR 74 | Temp 98.3°F | Resp 16 | Wt 87.0 lb

## 2018-06-19 DIAGNOSIS — K909 Intestinal malabsorption, unspecified: Secondary | ICD-10-CM | POA: Insufficient documentation

## 2018-06-19 DIAGNOSIS — D631 Anemia in chronic kidney disease: Secondary | ICD-10-CM | POA: Insufficient documentation

## 2018-06-19 DIAGNOSIS — I4891 Unspecified atrial fibrillation: Secondary | ICD-10-CM | POA: Insufficient documentation

## 2018-06-19 DIAGNOSIS — D508 Other iron deficiency anemias: Secondary | ICD-10-CM | POA: Diagnosis not present

## 2018-06-19 DIAGNOSIS — Z7901 Long term (current) use of anticoagulants: Secondary | ICD-10-CM | POA: Diagnosis not present

## 2018-06-19 DIAGNOSIS — Z95828 Presence of other vascular implants and grafts: Secondary | ICD-10-CM

## 2018-06-19 DIAGNOSIS — Z9981 Dependence on supplemental oxygen: Secondary | ICD-10-CM | POA: Insufficient documentation

## 2018-06-19 DIAGNOSIS — J449 Chronic obstructive pulmonary disease, unspecified: Secondary | ICD-10-CM | POA: Diagnosis not present

## 2018-06-19 DIAGNOSIS — D638 Anemia in other chronic diseases classified elsewhere: Secondary | ICD-10-CM

## 2018-06-19 DIAGNOSIS — D500 Iron deficiency anemia secondary to blood loss (chronic): Secondary | ICD-10-CM

## 2018-06-19 HISTORY — DX: Anemia in chronic kidney disease: D63.1

## 2018-06-19 LAB — CBC WITH DIFFERENTIAL (CANCER CENTER ONLY)
Abs Immature Granulocytes: 0.06 10*3/uL (ref 0.00–0.07)
Basophils Absolute: 0 10*3/uL (ref 0.0–0.1)
Basophils Relative: 0 %
Eosinophils Absolute: 0.1 10*3/uL (ref 0.0–0.5)
Eosinophils Relative: 1 %
HCT: 28.3 % — ABNORMAL LOW (ref 36.0–46.0)
Hemoglobin: 9 g/dL — ABNORMAL LOW (ref 12.0–15.0)
Immature Granulocytes: 1 %
Lymphocytes Relative: 4 %
Lymphs Abs: 0.5 10*3/uL — ABNORMAL LOW (ref 0.7–4.0)
MCH: 30.8 pg (ref 26.0–34.0)
MCHC: 31.8 g/dL (ref 30.0–36.0)
MCV: 96.9 fL (ref 80.0–100.0)
Monocytes Absolute: 1 10*3/uL (ref 0.1–1.0)
Monocytes Relative: 9 %
Neutro Abs: 9 10*3/uL — ABNORMAL HIGH (ref 1.7–7.7)
Neutrophils Relative %: 85 %
Platelet Count: 256 10*3/uL (ref 150–400)
RBC: 2.92 MIL/uL — ABNORMAL LOW (ref 3.87–5.11)
RDW: 14 % (ref 11.5–15.5)
WBC Count: 10.7 10*3/uL — ABNORMAL HIGH (ref 4.0–10.5)
nRBC: 0 % (ref 0.0–0.2)

## 2018-06-19 LAB — RETICULOCYTES
Immature Retic Fract: 13.3 % (ref 2.3–15.9)
RBC.: 2.97 MIL/uL — ABNORMAL LOW (ref 3.87–5.11)
Retic Count, Absolute: 79.6 10*3/uL (ref 19.0–186.0)
Retic Ct Pct: 2.7 % (ref 0.4–3.1)

## 2018-06-19 LAB — CMP (CANCER CENTER ONLY)
ALT: 38 U/L (ref 0–44)
AST: 27 U/L (ref 15–41)
Albumin: 3.4 g/dL — ABNORMAL LOW (ref 3.5–5.0)
Alkaline Phosphatase: 110 U/L (ref 38–126)
Anion gap: 7 (ref 5–15)
BUN: 33 mg/dL — ABNORMAL HIGH (ref 8–23)
CO2: 32 mmol/L (ref 22–32)
Calcium: 7.7 mg/dL — ABNORMAL LOW (ref 8.9–10.3)
Chloride: 102 mmol/L (ref 98–111)
Creatinine: 1 mg/dL (ref 0.44–1.00)
GFR, Est AFR Am: >60 mL/min (ref >60)
GFR, Estimated: 56 mL/min — ABNORMAL LOW (ref >60)
Glucose, Bld: 160 mg/dL — ABNORMAL HIGH (ref 70–99)
Potassium: 3.8 mmol/L (ref 3.5–5.1)
Sodium: 141 mmol/L (ref 135–145)
Total Bilirubin: 0.4 mg/dL (ref 0.3–1.2)
Total Protein: 6 g/dL — ABNORMAL LOW (ref 6.5–8.1)

## 2018-06-19 MED ORDER — HEPARIN SOD (PORK) LOCK FLUSH 100 UNIT/ML IV SOLN
500.0000 [IU] | Freq: Once | INTRAVENOUS | Status: AC
  Administered 2018-06-19: 500 [IU] via INTRAVENOUS
  Filled 2018-06-19: qty 5

## 2018-06-19 MED ORDER — SODIUM CHLORIDE 0.9% FLUSH
10.0000 mL | Freq: Once | INTRAVENOUS | Status: AC
  Administered 2018-06-19: 10 mL via INTRAVENOUS
  Filled 2018-06-19: qty 10

## 2018-06-19 NOTE — Patient Instructions (Signed)

## 2018-06-19 NOTE — Telephone Encounter (Signed)
Patient would like to go ahead with the MRI, she states she's still having sever back pain. Patient's callback # [phone]

## 2018-06-19 NOTE — Progress Notes (Signed)
Hematology and Oncology Follow Up Visit  [name] [id number] 09/24/1945 73 y.o. 06/19/2018   Principle Diagnosis:  Iron deficiency anemia  Current Therapy:   IV iron as indicated   Interim History:  [name] is here today for follow-up.  She has her supplemental oxygen tank with her.  She has bad underlying COPD.  She is doing okay with Xarelto.  She has atrial fibrillation.  She has had no obvious bleeding.  We last saw her back in February.  At that time, her iron studies showed a ferritin of 1200 with an iron saturation of 43%.  She seems to be doing okay.  Was noted that she really has never had much of a bump in her hemoglobin despite getting iron.  I think part of this is the fact that she has a very low erythropoietin level.  Back in December 2019, we checked her erythropoietin level.  It was only 6.4.  We may have to seriously consider giving her Aranesp to try to get her blood count better.  I talked her about this.  I explained what Aranesp is and how it works.  I suspect that this probably will work well for her.  She is on quite a few medications.  I hope these do not cause many problems for her.  Overall, her performance status is ECOG 2.     ECOG Performance Status: 1 - Symptomatic but completely ambulatory  Medications:  Allergies as of 06/19/2018      Reactions   Dabigatran Etexilate Mesylate Other (See Comments)   INTERNAL BLEEDING-pradaxa   Talwin [pentazocine] Other (See Comments)   hallucinations   Lactate Rash      Medication List       Accurate as of June 19, 2018  1:31 PM. Always use your most recent med list.        acetaminophen 325 MG tablet Commonly known as:  TYLENOL Take 2 tablets (650 mg total) by mouth every 4 (four) hours as needed.   albuterol 108 (90 Base) MCG/ACT inhaler Commonly known as:  PROVENTIL HFA;VENTOLIN HFA Inhale 2 puffs into the lungs every 6 (six) hours as needed (for cough, wheeze, chest congestion or  shortness of breath).   amLODipine 2.5 MG tablet Commonly known as:  NORVASC Take 1 tablet (2.5 mg total) by mouth daily.   atorvastatin 20 MG tablet Commonly known as:  LIPITOR Take 1 tablet (20 mg total) by mouth daily at 6 PM.   carvedilol 25 MG tablet Commonly known as:  COREG TAKE 1 TABLET BY MOUTH TWICE DAILY   cyclobenzaprine 10 MG tablet Commonly known as:  FLEXERIL Take 1 tablet (10 mg total) by mouth 3 (three) times daily as needed for muscle spasms.   denosumab 60 MG/ML Sosy injection Commonly known as:  PROLIA Inject 60 mg into the skin every 6 (six) months.   Eliquis 5 MG Tabs tablet Generic drug:  apixaban Take 1 tablet by mouth twice daily   furosemide 40 MG tablet Commonly known as:  LASIX TAKE ONE TABLET (40mg ) BY MOUTH IN THE MORNING   gabapentin 600 MG tablet Commonly known as:  NEURONTIN TAKE 1 TABLET BY MOUTH 4 TIMES DAILY   lidocaine-prilocaine cream Commonly known as:  EMLA Apply 1 application topically as needed.   methylPREDNISolone 4 MG tablet Commonly known as:  Medrol Take as directed   nitroGLYCERIN 0.4 MG SL tablet Commonly known as:  NITROSTAT Place 1 tablet (0.4 mg total) under the tongue  every 5 (five) minutes as needed. CHEST PAIN   oxyCODONE-acetaminophen 10-325 MG tablet Commonly known as:  PERCOCET Take 1 tablet by mouth every 4 (four) hours as needed. PAIN   pantoprazole 40 MG tablet Commonly known as:  PROTONIX TAKE 1 TABLET BY MOUTH TWICE DAILY   promethazine 25 MG tablet Commonly known as:  PHENERGAN TAKE ONE TABLET BY MOUTH EVERY 8 HOURS AS NEEDED FOR NAUSEA   Spiriva Respimat 1.25 MCG/ACT Aers Generic drug:  Tiotropium Bromide Monohydrate INHALE 2 PUFFS INTO THE LUNGS DAILY   tiZANidine 4 MG tablet Commonly known as:  ZANAFLEX Take 1 tablet by mouth three times daily as needed for muscle spasm   triamcinolone ointment 0.1 % Commonly known as:  KENALOG APPLY TO SKIN TWICE DAILY FOR 14 DAYS   zolpidem 5 MG  tablet Commonly known as:  AMBIEN TAKE 1 TABLET BY MOUTH EVERY DAY AT BEDTIME AS NEEDED       Allergies:  Allergies  Allergen Reactions  . Dabigatran Etexilate Mesylate Other (See Comments)    INTERNAL BLEEDING-pradaxa  . Talwin [Pentazocine] Other (See Comments)    hallucinations  . Lactate Rash    Past Medical History, Surgical history, Social history, and Family History were reviewed and updated.  Review of Systems: Review of Systems  Constitutional: Negative.   HENT: Negative.   Eyes: Negative.   Respiratory: Positive for shortness of breath.   Gastrointestinal: Negative.   Genitourinary: Negative.   Musculoskeletal: Negative.   Skin: Negative.   Neurological: Negative.   Endo/Heme/Allergies: Negative.   Psychiatric/Behavioral: Negative.       Physical Exam:  vitals were not taken for this visit.   Wt Readings from Last 3 Encounters:  05/14/18 90 lb 3.2 oz (40.9 kg)  04/23/18 86 lb 12.8 oz (39.4 kg)  03/24/18 89 lb 6.4 oz (40.6 kg)    Physical Exam Vitals signs reviewed.  HENT:     Head: Normocephalic and atraumatic.  Eyes:     Pupils: Pupils are equal, round, and reactive to light.  Neck:     Musculoskeletal: Normal range of motion.  Cardiovascular:     Rate and Rhythm: Normal rate and regular rhythm.     Heart sounds: Normal heart sounds.  Pulmonary:     Effort: Pulmonary effort is normal.     Breath sounds: Normal breath sounds.  Abdominal:     General: Bowel sounds are normal.     Palpations: Abdomen is soft.  Musculoskeletal: Normal range of motion.        General: No tenderness or deformity.  Lymphadenopathy:     Cervical: No cervical adenopathy.  Skin:    General: Skin is warm and dry.     Findings: No erythema or rash.  Neurological:     Mental Status: She is alert and oriented to person, place, and time.  Psychiatric:        Behavior: Behavior normal.        Thought Content: Thought content normal.        Judgment: Judgment normal.       Lab Results  Component Value Date   WBC 7.0 04/23/2018   HGB 10.8 (L) 04/23/2018   HCT 34.6 (L) 04/23/2018   MCV 94.8 04/23/2018   PLT 260 04/23/2018   Lab Results  Component Value Date   FERRITIN 1,266 (H) 04/23/2018   IRON 81 04/23/2018   TIBC 186 (L) 04/23/2018   UIBC 106 (L) 04/23/2018   IRONPCTSAT 43 04/23/2018  Lab Results  Component Value Date   RETICCTPCT 1.1 04/23/2018   RBC 3.65 (L) 04/23/2018   RBC 3.65 (L) 04/23/2018   RETICCTABS 64.6 04/01/2009   No results found for: KPAFRELGTCHN, LAMBDASER, KAPLAMBRATIO No results found for: IGGSERUM, IGA, IGMSERUM No results found for: Odetta Pink, SPEI   Chemistry      Component Value Date/Time   NA 137 04/23/2018 1256   K 4.7 04/23/2018 1256   CL 103 04/23/2018 1256   CO2 29 04/23/2018 1256   BUN 18 04/23/2018 1256   CREATININE 1.03 (H) 04/23/2018 1256   CREATININE 0.91 03/16/2016 1539      Component Value Date/Time   CALCIUM 8.5 (L) 04/23/2018 1256   ALKPHOS 104 04/23/2018 1256   AST 17 04/23/2018 1256   ALT 9 04/23/2018 1256   BILITOT 0.3 04/23/2018 1256       Impression and Plan: Ms. Stegmaier is a very pleasant 73 yo caucasian female with iron deficiency anemia secondary to malabsorption and possible GI blood loss with Eliquis.   We will see what her iron levels are.  I had believe that they are going to be okay.  Again, I suspect that her problem with the anemia is going to be low erythropoietin level.  I told her that we can get around this by giving her Aranesp.  I would like to have her come back in 5 weeks.  We may have to get her back sooner if we find that she has a low iron level and we had to give her IV iron.  Volanda Napoleon, MD 4/2/20201:31 PM

## 2018-06-20 ENCOUNTER — Telehealth: Payer: Self-pay | Admitting: Hematology & Oncology

## 2018-06-20 ENCOUNTER — Other Ambulatory Visit (INDEPENDENT_AMBULATORY_CARE_PROVIDER_SITE_OTHER): Payer: Self-pay

## 2018-06-20 DIAGNOSIS — M4807 Spinal stenosis, lumbosacral region: Secondary | ICD-10-CM

## 2018-06-20 LAB — IRON AND TIBC
Iron: 19 ug/dL — ABNORMAL LOW (ref 41–142)
Saturation Ratios: 9 % — ABNORMAL LOW (ref 21–57)
TIBC: 199 ug/dL — ABNORMAL LOW (ref 236–444)
UIBC: 180 ug/dL (ref 120–384)

## 2018-06-20 LAB — FERRITIN: Ferritin: 1404 ng/mL — ABNORMAL HIGH (ref 11–307)

## 2018-06-20 NOTE — Telephone Encounter (Signed)
I sent an order for an MRI for her just FYI If there is anything else you would like me to so for her just let me know

## 2018-06-20 NOTE — Telephone Encounter (Signed)
Checked Cover My Meds. It stills looks as though the pt PA is still pending.

## 2018-06-20 NOTE — Telephone Encounter (Signed)
Thanks.  That will be fine.

## 2018-06-20 NOTE — Telephone Encounter (Signed)
sw pt to confirm iron appt 4/6 at 1130 per 4/3 sch msg

## 2018-06-23 ENCOUNTER — Other Ambulatory Visit: Payer: Self-pay

## 2018-06-23 ENCOUNTER — Inpatient Hospital Stay: Payer: HMO

## 2018-06-23 VITALS — BP 113/54 | HR 80 | Temp 98.1°F

## 2018-06-23 DIAGNOSIS — D500 Iron deficiency anemia secondary to blood loss (chronic): Secondary | ICD-10-CM

## 2018-06-23 DIAGNOSIS — D508 Other iron deficiency anemias: Secondary | ICD-10-CM | POA: Diagnosis not present

## 2018-06-23 MED ORDER — SODIUM CHLORIDE 0.9% FLUSH
10.0000 mL | Freq: Once | INTRAVENOUS | Status: AC
  Administered 2018-06-23: 10 mL
  Filled 2018-06-23: qty 10

## 2018-06-23 MED ORDER — HEPARIN SOD (PORK) LOCK FLUSH 100 UNIT/ML IV SOLN
500.0000 [IU] | Freq: Once | INTRAVENOUS | Status: AC
  Administered 2018-06-23: 500 [IU] via INTRAVENOUS
  Filled 2018-06-23: qty 5

## 2018-06-23 MED ORDER — SODIUM CHLORIDE 0.9 % IV SOLN
INTRAVENOUS | Status: DC
  Administered 2018-06-23: 12:00:00 via INTRAVENOUS
  Filled 2018-06-23: qty 250

## 2018-06-23 MED ORDER — SODIUM CHLORIDE 0.9 % IV SOLN
510.0000 mg | Freq: Once | INTRAVENOUS | Status: AC
  Administered 2018-06-23: 510 mg via INTRAVENOUS
  Filled 2018-06-23: qty 17

## 2018-06-23 NOTE — Telephone Encounter (Signed)
Checked PA folder and there is no fax in there in regards to PA determination.  Called CoverMyMeds and spoke with Luvenia Starch to see if she could help [place] out with the PA determination since we still had not received anything. Per Luvenia Starch, on her end, it is still saying waiting PA determination and due to this, she said insurance company will need to be contacted directly at [phone].  Called [facility] PA line and spoke with Delfino Lovett and he could not locate pt in their system.  Looked at back of pt's insurance card and found PA number to be called at [phone]. Spoke with Junius Creamer to check status of PA. Per Junius Creamer, there was no info on file in regards to anything being started but when I told her that we initiated the PA via covermymeds, she said we needed to go through [place] at phone number [phone]. Called [employer] and spoke with Beola Cord who stated at this time, their system was currently updating and we would need to call back in 1 hour. Will keep in triage. The number that needs to be called is [phone].

## 2018-06-23 NOTE — Patient Instructions (Signed)

## 2018-06-23 NOTE — Telephone Encounter (Signed)
Will route to triage for follow up. Pt's insurance company will need to be contacted.

## 2018-06-24 NOTE — Telephone Encounter (Signed)
Spoke with Emiko at [place] and she stated the PA for albuteril inhaler has been denied. We started the appeal process for Albuterol. She states we can await the decision but we could call back tomorrow at the number below to check status. Will leave in triage to follow up.   [phone]

## 2018-06-25 ENCOUNTER — Other Ambulatory Visit: Payer: Self-pay | Admitting: Pharmacist

## 2018-06-25 NOTE — Progress Notes (Signed)
Forms coming from [facility] for med assistance

## 2018-06-25 NOTE — Patient Outreach (Signed)
[facility]) Care Management  [place]   06/25/2018  [name] [birth year] [id number]  Reason for referral: Medication Assistance, Medication Review  Referral source: [place] Management RN with [employer] Current insurance: [employer] C-[employer]  PMHx includes but not limited to:   Iron deficiency anemia, a fib, CAD, CHF, COPD, Fibromyalgia, Hypertension, GERD, Gastroparesis, hyperlipidemia, insomnia, osteoarthritis, and osteoporosis and  recurrent UTI.  Outreach:  Successful telephone call with patient.  HIPAA identifiers verified.   Subjective:  Patient reports using "baggies"  as an adherence strategy. Patient reported separating her medications by the time of day that she takes them. Her husband helps her to remember as well.  Does the patient ever forget to take medication?  no Does the patient have problems obtaining medications due to transportation?   no Does the patient have problems obtaining medications due to cost?  Yes once she hits the donut hole  Does the patient feel that medications prescribed are effective?  yes Does the patient ever experience any side effects to the medications prescribed?  no  Does the patient measure his/her own blood glucose at home?  No Does the patient measure his/her own blood pressure at home? Yes   Objective: Lab Results  Component Value Date   CREATININE 1.00 06/19/2018   CREATININE 1.03 (H) 04/23/2018   CREATININE 0.81 02/26/2018    Lab Results  Component Value Date   HGBA1C 5.2 05/31/2017    Lipid Panel     Component Value Date/Time   CHOL 140 05/31/2017 0753   TRIG 67 05/31/2017 0753   TRIG 76 03/11/2006 1059   HDL 43 05/31/2017 0753   CHOLHDL 3.3 05/31/2017 0753   VLDL 13 05/31/2017 0753   LDLCALC 84 05/31/2017 0753    BP Readings from Last 3 Encounters:  06/23/18 (!) 113/54  06/19/18 92/66  05/14/18 92/64    Allergies  Allergen Reactions  .  Dabigatran Etexilate Mesylate Other (See Comments)    INTERNAL BLEEDING-pradaxa  . Talwin [Pentazocine] Other (See Comments)    hallucinations  . Lactate Rash    Medications Reviewed Today    Reviewed by Elayne Guerin, [facility] (Pharmacist) on 06/25/18 at [phone]  Med List Status: <None>  Medication Order Taking? Sig Documenting Provider Last Dose Status Informant  acetaminophen (TYLENOL) 325 MG tablet [id number] Yes Take 2 tablets (650 mg total) by mouth every 4 (four) hours as needed. Saverio Danker, PA-C Taking Active   albuterol (PROVENTIL HFA;VENTOLIN HFA) 108 (90 Base) MCG/ACT inhaler [id number] Yes Inhale 2 puffs into the lungs every 6 (six) hours as needed (for cough, wheeze, chest congestion or shortness of breath). Chesley Mires, MD Taking Active   amLODipine (NORVASC) 2.5 MG tablet [id number] Yes Take 1 tablet (2.5 mg total) by mouth daily. Minus Breeding, MD Taking Active   atorvastatin (LIPITOR) 20 MG tablet [id number] Yes Take 1 tablet (20 mg total) by mouth daily at 6 PM. Carollee Herter, Alferd Apa, DO Taking Active   carvedilol (COREG) 25 MG tablet [id number] Yes TAKE 1 TABLET BY MOUTH TWICE DAILY Minus Breeding, MD Taking Active   denosumab (PROLIA) 60 MG/ML SOSY injection [id number] No Inject 60 mg into the skin every 6 (six) months. [provider] Not Taking Active   ELIQUIS 5 MG TABS tablet [id number] Yes Take 1 tablet by mouth twice daily Carollee Herter, Yvonne R, DO Taking Active   furosemide (LASIX) 40 MG tablet [id number] Yes TAKE ONE TABLET (2m) BY MOUTH  IN THE Javier Docker, Jeneen Rinks, MD Taking Active   gabapentin (NEURONTIN) 600 MG tablet [id number] Yes TAKE 1 TABLET BY MOUTH 4 TIMES DAILY Ann Held, DO Taking Active         Discontinued 06/05/11 1433 (Error)   lidocaine-prilocaine (EMLA) cream [id number] Yes Apply 1 application topically as needed. Volanda Napoleon, MD Taking Active   nitrofurantoin (MACRODANTIN) 100 MG capsule [id number] Yes Take 100 mg by mouth  daily. [provider] Taking Active   nitroGLYCERIN (NITROSTAT) 0.4 MG SL tablet [id number] Yes Place 1 tablet (0.4 mg total) under the tongue every 5 (five) minutes as needed. CHEST PAIN Deboraha Sprang, MD Taking Active Self           Med Note Gar Ponto   Wed May 14, 2018 10:10 AM)    oxyCODONE-acetaminophen (PERCOCET) 10-325 MG tablet [id number] Yes Take 1 tablet by mouth every 4 (four) hours as needed. PAIN Ann Held, DO Taking Active   pantoprazole (PROTONIX) 40 MG tablet [id number] Yes TAKE 1 TABLET BY MOUTH TWICE DAILY Gatha Mayer, MD Taking Active   promethazine (PHENERGAN) 25 MG tablet [id number] Yes TAKE ONE TABLET BY MOUTH EVERY 8 HOURS AS NEEDED FOR NAUSEA Gatha Mayer, MD Taking Active   SPIRIVA RESPIMAT 1.25 MCG/ACT AERS [id number] Yes INHALE 2 PUFFS INTO THE LUNGS DAILY Chesley Mires, MD Taking Active   tiZANidine (ZANAFLEX) 4 MG tablet [id number] Yes Take 1 tablet by mouth three times daily as needed for muscle spasm Carollee Herter, Alferd Apa, DO Taking Active   triamcinolone ointment (KENALOG) 0.1 % [id number] Yes APPLY TO SKIN TWICE DAILY FOR 14 DAYS [provider] Taking Active   zolpidem (AMBIEN) 5 MG tablet [id number] Yes TAKE 1 TABLET BY MOUTH EVERY DAY AT BEDTIME AS NEEDED Ann Held, DO Taking Active           Assessment:  Drugs sorted by system:  Neurologic/Psychologic: Gabapentin, Zolpidem  Cardiovascular: Amlodipine, Atorvastatin, Carvedilol, Eliquis, Furosemide, Nitroglycerin  Pulmonary/Allergy: Spiriva, Albuterol HFA  Gastrointestinal: Pantoprazole, Promethazine  Endocrine: Prolia  Topical: Emla Cream, Triamcinolone ointment  Pain: Acetaminophen, Oxycodone/APAP, Tizanidine  Infectious Diseases: Nitrofurantoin  Vitamins/Minerals/Supplements: Iron Dextran infusion  Medication Review Findings:  . Hga1c 5.2%   Medication Adherence Findings: Adherence Review  '[x]'  Excellent (no doses  missed/week)     '[]'  Good (no more than 1 dose missed/week)     '[]'  Partial (2-3 doses missed/week) '[]'  Poor (>3 doses missed/week)  Patient with excellent understanding of regimen and good understanding of indications.    Potential of compliance: excellent  Medication Assistance Findings:  Medication assistance needs identified.   Extra Help:  Not eligible for Extra Help Low Income Subsidy based on reported income and assets  Patient Assistance Programs: Spiriva  made by [place] requirement met: Yes o Out-of-pocket prescription expenditure met:   Yes - Patient has met application requirements to apply for this patient assistance program.    Proventil made by [employer] o Income requirement met: yes o Out-of-pocket prescription expenditure met:   Not Applicable - Patient has not met application requirements to apply for this patient assistance program at this time.   Eliquis made by [place] requirement met: Yes o Out-of-pocket prescription expenditure met:   No [phone]) - Patient has not met application requirements to apply for this patient assistance program at this time.    Additional medication assistance options reviewed with patient as warranted:  No other options identified  Plan: . I will route patient assistance letter to [place] technician who will coordinate patient assistance program application process for medications listed above.  [facility] pharmacy technician will assist with obtaining all required documents from both patient and provider(s) and submit application(s) once completed.  . Will route note to [facility], CPhT.  . Will follow-up in 3 weeks.

## 2018-06-25 NOTE — Telephone Encounter (Signed)
Called [employer], spoke with Arianne, made aware we were checking status of appeal, she states it can take 5-7 business days and to check back next week. Voiced understanding. Will send to [facility] to hold until determination is made.

## 2018-06-26 ENCOUNTER — Telehealth: Payer: Self-pay | Admitting: Pulmonary Disease

## 2018-06-26 ENCOUNTER — Other Ambulatory Visit: Payer: Self-pay | Admitting: Pharmacy Technician

## 2018-06-26 MED ORDER — ALBUTEROL SULFATE HFA 108 (90 BASE) MCG/ACT IN AERS: 2.0000 | INHALATION_SPRAY | Freq: Four times a day (QID) | RESPIRATORY_TRACT | 1 refills | Status: DC | PRN

## 2018-06-26 NOTE — Patient Outreach (Signed)
[name]) Care Management  06/26/2018  [name] [birth year] [id number]                           Medication Assistance Referral  Referral From: [place]  Medication/Company: [employer] / Proventil HFA Patient application portion:  Education officer, museum portion: Interoffice Mailed to Dr. Halford Chessman  Medication/Company: [employer] / Spiriva Respimat Patient application portion:  Mailed Provider application portion: Faxed  to Dr. Halford Chessman  Medication/Company: Eliquis / BMS Patient application portion:  Mailed Provider application portion: Faxed  to Dr. Carollee Herter  Follow up:  Will follow up with patient in 5-10 business days to confirm application(s) have been received.  Brittinee Risk P. Yari Szeliga, [place] Management [phone]

## 2018-06-26 NOTE — Telephone Encounter (Signed)
Fax was received from [facility] stating that pt's albuterol inhaler has been approved through 03/19/2019.  Called pt's pharmacy and spoke with Ulice Dash letting her know that the med is now approved. Ulice Dash was able to rerun the claim for pt's albuterol HFA inhaler and it went through.  Called and spoke with pt letting her know this had been taken care of. Pt expressed understanding. Nothing further needed.

## 2018-06-26 NOTE — Telephone Encounter (Signed)
Call returned to pharmacy, requesting albuterol be ran as pro-air due to insurance. Script sent in as pro-air. Voiced understanding. Nothing further is needed at this time.

## 2018-07-02 ENCOUNTER — Telehealth: Payer: Self-pay | Admitting: *Deleted

## 2018-07-02 NOTE — Telephone Encounter (Signed)
Received request for patient assistance via [place] sent through [place] Management; forwarded to provider/SLS 04/15

## 2018-07-04 ENCOUNTER — Other Ambulatory Visit: Payer: Self-pay | Admitting: Pharmacy Technician

## 2018-07-04 NOTE — Patient Outreach (Signed)
[facility]) Care Management  07/04/2018  [name] [birth year] [id number]  Successful outreach call placed to patient in regards to her patient assistance applications for Eliquis thru [employer], Spiriva Respimat thru BI and Proventil HFA thru [employer].  Spoke to patient, HIPAA identifiers verified. Patient informed that she has not received the applications in the mail as of yesterday. Informed patient that the applications would be coming in a large [employer] envelope. Informed patient that I would followup with her early next week and if they still have not arrived then I would mail her out a new set of applications. Patient was agreeable to this plan.  Will followup with patient in 1-3 days to inquire if she has received the applications.  [name], [place] Management [phone]

## 2018-07-07 ENCOUNTER — Ambulatory Visit (INDEPENDENT_AMBULATORY_CARE_PROVIDER_SITE_OTHER): Payer: HMO | Admitting: *Deleted

## 2018-07-07 ENCOUNTER — Other Ambulatory Visit: Payer: Self-pay | Admitting: Pharmacy Technician

## 2018-07-07 ENCOUNTER — Other Ambulatory Visit: Payer: Self-pay

## 2018-07-07 DIAGNOSIS — I428 Other cardiomyopathies: Secondary | ICD-10-CM

## 2018-07-07 LAB — CUP PACEART REMOTE DEVICE CHECK
Battery Remaining Longevity: 8 mo
Battery Voltage: 2.85 V
Brady Statistic AP VP Percent: 0.69 %
Brady Statistic AP VS Percent: 0 %
Brady Statistic AS VP Percent: 99.26 %
Brady Statistic AS VS Percent: 0.05 %
Brady Statistic RA Percent Paced: 0.69 %
Brady Statistic RV Percent Paced: 99.94 %
Date Time Interrogation Session: 20200420204045
Implantable Lead Implant Date: 20111005
Implantable Lead Implant Date: 20111005
Implantable Lead Implant Date: 20111005
Implantable Lead Location: 753858
Implantable Lead Location: 753859
Implantable Lead Location: 753860
Implantable Lead Model: 4196
Implantable Lead Model: 5076
Implantable Lead Model: 5076
Implantable Pulse Generator Implant Date: 20111005
Lead Channel Impedance Value: 323 Ohm
Lead Channel Impedance Value: 380 Ohm
Lead Channel Impedance Value: 437 Ohm
Lead Channel Impedance Value: 437 Ohm
Lead Channel Impedance Value: 456 Ohm
Lead Channel Impedance Value: 475 Ohm
Lead Channel Impedance Value: 494 Ohm
Lead Channel Impedance Value: 513 Ohm
Lead Channel Impedance Value: 722 Ohm
Lead Channel Pacing Threshold Amplitude: 0.5 V
Lead Channel Pacing Threshold Amplitude: 0.5 V
Lead Channel Pacing Threshold Pulse Width: 0.4 ms
Lead Channel Pacing Threshold Pulse Width: 0.4 ms
Lead Channel Sensing Intrinsic Amplitude: 0.75 mV
Lead Channel Sensing Intrinsic Amplitude: 0.75 mV
Lead Channel Sensing Intrinsic Amplitude: 6.25 mV
Lead Channel Sensing Intrinsic Amplitude: 8 mV
Lead Channel Setting Pacing Amplitude: 1.25 V
Lead Channel Setting Pacing Amplitude: 2 V
Lead Channel Setting Pacing Amplitude: 2.5 V
Lead Channel Setting Pacing Pulse Width: 0.4 ms
Lead Channel Setting Pacing Pulse Width: 0.8 ms
Lead Channel Setting Sensing Sensitivity: 0.9 mV

## 2018-07-07 NOTE — Patient Outreach (Signed)
[facility]) Care Management  07/07/2018  [name] [birth year] [id number]  Successful outreach call placed to patient in regards to [employer] application for Eliquis, [employer] application for Spiriva Respimat and [employer] application for Proventil HFA.  Spoke to patient, HIPAA identifiers verified.  Patient informed that she had received the applications over the weekend. She informed she filled them out. She informed she placed them in the mail today.  Will followup with patient in 10-14 business days if applications have not been received back.  Kwynn Schlotter P. Brittaney Beaulieu, [place] Management [phone]

## 2018-07-10 ENCOUNTER — Other Ambulatory Visit: Payer: Self-pay | Admitting: Family Medicine

## 2018-07-10 DIAGNOSIS — R52 Pain, unspecified: Secondary | ICD-10-CM

## 2018-07-12 DIAGNOSIS — J449 Chronic obstructive pulmonary disease, unspecified: Secondary | ICD-10-CM | POA: Diagnosis not present

## 2018-07-12 DIAGNOSIS — I4891 Unspecified atrial fibrillation: Secondary | ICD-10-CM | POA: Diagnosis not present

## 2018-07-14 ENCOUNTER — Other Ambulatory Visit: Payer: Self-pay

## 2018-07-14 ENCOUNTER — Other Ambulatory Visit: Payer: Self-pay | Admitting: Family Medicine

## 2018-07-14 DIAGNOSIS — R52 Pain, unspecified: Secondary | ICD-10-CM

## 2018-07-14 DIAGNOSIS — G4700 Insomnia, unspecified: Secondary | ICD-10-CM

## 2018-07-14 NOTE — Telephone Encounter (Signed)
Requesting: Ambien Contract: N/A UDS: Low risk, next screen 09/05/2017 Last OV: 12/09/2017 Next OV: 08/04/2018 Last Refill: 06/12/2018, #30--0 RF Database:   Please advise

## 2018-07-14 NOTE — Telephone Encounter (Signed)
Requested medication (s) are due for refill today: Yes  Requested medication (s) are on the active medication list: Yes  Last refill:  06/17/18  Future visit scheduled: Yes  Notes to clinic: Unable to delegate, failed items in protocol, appointment needed     Requested Prescriptions  Pending Prescriptions Disp Refills   oxyCODONE-acetaminophen (PERCOCET) 10-325 MG tablet 120 tablet 0    Sig: Take 1 tablet by mouth every 4 (four) hours as needed. PAIN     Not Delegated - Analgesics:  Opioid Agonist Combinations Failed - 07/14/2018  9:23 AM      Failed - This refill cannot be delegated      Failed - Urine Drug Screen completed in last 360 days.      Failed - Valid encounter within last 6 months    Recent Outpatient Visits          7 months ago Bad odor of urine   Archivist at [place], DO   10 months ago Anemia, chronic disease   Archivist at [place], DO   1 year ago Anemia, unspecified type   [facility]ivist at [place], DO   1 year ago TIA (transient ischemic attack)   Archivist at [place], DO   1 year ago High risk medication use   Archivist at [employer] [employer], [place], DO      Future Appointments            In 1 week Minus Breeding, MD [place], [place]   In 3 weeks [place], DO [employer] at [employer], [place]   In 2 months Chesley Mires, MD [facility]

## 2018-07-14 NOTE — Progress Notes (Signed)
Can do virtual visit

## 2018-07-14 NOTE — Patient Outreach (Signed)
  [facility]) Care Management Chronic Special Needs Program  07/14/2018  Name: [name] DOB: September 04, 1945  MRN: [id number]  [name] is enrolled in a chronic special needs plan for Heart Failure. Reviewed and updated care plan.  Subjective: Client states she has been doing good and staying isolated for Covid 19.  States she is weighing daily and she knows when to call her doctor.  States she is working with the pharmacist to get medication assistance with her medications.  Goals Addressed            This Visit's Progress   . Client understands the importance of follow-up with providers by attending scheduled visits   On track   . Client will report abillity to obtain Medications within 3 months       Continue to work with Personal assistant     . Client will report no worsening of symptoms of Atrial Fibrillation within the next 6 months      . Client will report weighing and recording weights daily within next 6 months      . Client will verbalize knowledge of chronic lung disease as evidenced by no ED visits or Inpatient stays related to chronic lung disease    On track   . Client will verbalize knowledge of self management of Hypertension as evidences by BP reading of 140/90 or less; or as defined by provider   On track   . Maintain timely refills of Heart Failure medication as prescribed within the year    On track   . Obtain annual  Lipid Profile, LDL-C   On track   . Visit Primary Care Provider or Cardiologist at least 2 times per year   On track    Client with HF reports weighting daily but does not write down but reports it does not vary very much.  Reports following low sodium diet. Verbalizes good teach back on s/s of HF to call provider. Client is working with Special educational needs teacher for assistance with medications. Discussed COVID19 cause, symptoms, precautions (social distancing, stay at home order, hand  washing), confirmed client knows how to contact provider  Plan:  Send successful outreach letter with a copy of their individualized care plan, Send individual care plan to provider and Send educational material- Atrial fib  Chronic care management coordinator will outreach in:  6 Months    Peter Garter RN, [facility], [place] Management Coordinator [place] Management [phone]

## 2018-07-15 ENCOUNTER — Encounter: Payer: Self-pay | Admitting: Cardiology

## 2018-07-15 MED ORDER — OXYCODONE-ACETAMINOPHEN 10-325 MG PO TABS: 1.0000 | ORAL_TABLET | ORAL | 0 refills | Status: DC | PRN

## 2018-07-15 NOTE — Progress Notes (Signed)
Remote pacemaker transmission.   

## 2018-07-15 NOTE — Telephone Encounter (Signed)
Last written: 06/17/18 Last ov: 12/09/17 Next ov: 08/04/18 Contract: will get at next visit UDS: will get at next visit

## 2018-07-16 ENCOUNTER — Ambulatory Visit: Payer: Self-pay | Admitting: Pharmacist

## 2018-07-16 ENCOUNTER — Other Ambulatory Visit: Payer: Self-pay | Admitting: Pharmacy Technician

## 2018-07-16 ENCOUNTER — Telehealth: Payer: Self-pay | Admitting: *Deleted

## 2018-07-16 NOTE — Telephone Encounter (Signed)
No paperwork is in VS cubby or folders at this time on this patient Placing message back to triage as this originated from triage

## 2018-07-16 NOTE — Telephone Encounter (Signed)
Kelli, please advise if you have seen patient assistance paperwork on pt that was sent on pt. Thanks!

## 2018-07-16 NOTE — Patient Outreach (Signed)
[facility]) Care Management  07/16/2018  ENID MAULTSBY 11/02/1945 [id number]  Successful outreach call placed to patient in regards to [employer]ck patient assistance application for Proventil HFA, BI patient assistance application for Spiriva and BMS application for Eliquis.  Spoke to patient, HIPAA identifiers verified. Patient informed that her husband did place the applications in the mail to [place] per the last time we talked (07/07/2018). Informed patient that I had not received them back yet but that our mail goes to the hospital to be sorted before coming back to [place]. Patient verbalized understanding and asked that she stay updated.  Will followup with patient in 7-10 business days if application still not received back.  Jahzaria Vary P. Cleotha Tsang, [place] Management [phone]

## 2018-07-16 NOTE — Patient Outreach (Signed)
[facility]) Care Management  07/16/2018  [name] 03/27/1945 [id number]   ADDENDUM  Care coordination call placed to Dr. Juanetta Gosling office in regards to patient's Merck application for Proventil HFA and BI application for Spiriva.  Spoke to [place] and informed that we were following up on 2 applications that were sent over on 06/26/2018. The Merck application was sent inter office via courier and the BI application was faxed and we had not received either one back. Sonya informed she would send a message back to the clinical staff.  Will followup with provider's office if call is not returned and/or applications not received back.  Ozell Ferrera P. Nyeema Want, [place] Management [phone]

## 2018-07-16 NOTE — Telephone Encounter (Signed)
Sharee Pimple @ [facility] calling to inquire about pt assistance that was sent over for patient. 1- Merck interoffice for [employer]. 2- [employer] forms was faxed on 4/9 for Spiriva.  Has anyone seen these form?

## 2018-07-17 ENCOUNTER — Other Ambulatory Visit: Payer: Self-pay | Admitting: Pharmacy Technician

## 2018-07-17 NOTE — Patient Outreach (Signed)
[facility]) Care Management  07/17/2018  [name] Feb 18, 1946 [id number]   Received all necessary paperwork, documents and signatures from both patient and provider for ELiquis patient assistance through [place].  Submitted completed application to BMS via fax.  Will followup with BMS in 7-10 business days to inquire on status of application.  Fiore Detjen P. Shyam Dawson, [place] Management [phone]

## 2018-07-18 ENCOUNTER — Other Ambulatory Visit: Payer: Self-pay | Admitting: Internal Medicine

## 2018-07-21 ENCOUNTER — Ambulatory Visit: Payer: HMO | Admitting: Cardiology

## 2018-07-21 ENCOUNTER — Telehealth: Payer: Self-pay

## 2018-07-21 NOTE — Telephone Encounter (Signed)
-----   Message from Jason Fila, CPhT sent at 07/17/2018  1:47 PM EDT ----- Good afternoon,  I will inter office via the courier, the Merck application (for Proventil HFA)  back over to the the office in the morning. Merck patient assistance program requires all original documents, so once Dr. Halford Chessman  has signed it please inter office back to my attention  at [place] Management. I have refaxed today the BI application (for Spiriva)  to [phone] for Dr. Halford Chessman to sign. Hope this helps. Thanks Ladies! Jill P. Simcox, [place] Management [phone]

## 2018-07-21 NOTE — Telephone Encounter (Signed)
rec'd paperwork this morning in VS cubby VS is out of the office this week; B.Mack seen pt prior B.Mack signed paperwork and faxed back to [facility] today Nothing further needed at this time.

## 2018-07-22 ENCOUNTER — Other Ambulatory Visit: Payer: Self-pay | Admitting: Pharmacy Technician

## 2018-07-22 NOTE — Patient Outreach (Signed)
[facility]) Care Management  07/22/2018  [name] 1945-06-12 [id number]   Received all necessary documents, paperwork and signatures from both patient and provider for BI patient assistance for Spiriva Respimat.  Submitted completed application via fax to [facility].  Will followup with BI in 7-10 business days to inquire on status of application.  [name] P. [name], [place] Management [phone]

## 2018-07-22 NOTE — Telephone Encounter (Signed)
Note    rec'd paperwork this morning in VS cubby VS is out of the office this week; B.Mack seen pt prior B.Mack signed paperwork and faxed back to [facility] today Nothing further needed at this time.     Documented 07/21/18 Amparo Bristol

## 2018-07-24 ENCOUNTER — Encounter: Payer: Self-pay | Admitting: Hematology & Oncology

## 2018-07-24 ENCOUNTER — Inpatient Hospital Stay: Payer: HMO

## 2018-07-24 ENCOUNTER — Other Ambulatory Visit: Payer: Self-pay

## 2018-07-24 ENCOUNTER — Inpatient Hospital Stay: Payer: HMO | Attending: Family | Admitting: Hematology & Oncology

## 2018-07-24 VITALS — BP 142/52 | HR 64 | Temp 98.7°F | Resp 18 | Wt 86.0 lb

## 2018-07-24 DIAGNOSIS — J449 Chronic obstructive pulmonary disease, unspecified: Secondary | ICD-10-CM | POA: Diagnosis not present

## 2018-07-24 DIAGNOSIS — Z95828 Presence of other vascular implants and grafts: Secondary | ICD-10-CM

## 2018-07-24 DIAGNOSIS — D631 Anemia in chronic kidney disease: Secondary | ICD-10-CM | POA: Insufficient documentation

## 2018-07-24 DIAGNOSIS — K909 Intestinal malabsorption, unspecified: Secondary | ICD-10-CM

## 2018-07-24 DIAGNOSIS — N189 Chronic kidney disease, unspecified: Secondary | ICD-10-CM | POA: Diagnosis not present

## 2018-07-24 DIAGNOSIS — D500 Iron deficiency anemia secondary to blood loss (chronic): Secondary | ICD-10-CM

## 2018-07-24 DIAGNOSIS — Z7901 Long term (current) use of anticoagulants: Secondary | ICD-10-CM

## 2018-07-24 DIAGNOSIS — D638 Anemia in other chronic diseases classified elsewhere: Secondary | ICD-10-CM

## 2018-07-24 DIAGNOSIS — D508 Other iron deficiency anemias: Secondary | ICD-10-CM | POA: Diagnosis not present

## 2018-07-24 DIAGNOSIS — Z79899 Other long term (current) drug therapy: Secondary | ICD-10-CM | POA: Diagnosis not present

## 2018-07-24 DIAGNOSIS — Z9981 Dependence on supplemental oxygen: Secondary | ICD-10-CM

## 2018-07-24 LAB — CBC WITH DIFFERENTIAL (CANCER CENTER ONLY)
Abs Immature Granulocytes: 0.02 10*3/uL (ref 0.00–0.07)
Basophils Absolute: 0 10*3/uL (ref 0.0–0.1)
Basophils Relative: 1 %
Eosinophils Absolute: 0.2 10*3/uL (ref 0.0–0.5)
Eosinophils Relative: 2 %
HCT: 29.9 % — ABNORMAL LOW (ref 36.0–46.0)
Hemoglobin: 9.5 g/dL — ABNORMAL LOW (ref 12.0–15.0)
Immature Granulocytes: 0 %
Lymphocytes Relative: 7 %
Lymphs Abs: 0.5 10*3/uL — ABNORMAL LOW (ref 0.7–4.0)
MCH: 30.9 pg (ref 26.0–34.0)
MCHC: 31.8 g/dL (ref 30.0–36.0)
MCV: 97.4 fL (ref 80.0–100.0)
Monocytes Absolute: 0.5 10*3/uL (ref 0.1–1.0)
Monocytes Relative: 7 %
Neutro Abs: 5.5 10*3/uL (ref 1.7–7.7)
Neutrophils Relative %: 83 %
Platelet Count: 245 10*3/uL (ref 150–400)
RBC: 3.07 MIL/uL — ABNORMAL LOW (ref 3.87–5.11)
RDW: 13.1 % (ref 11.5–15.5)
WBC Count: 6.8 10*3/uL (ref 4.0–10.5)
nRBC: 0 % (ref 0.0–0.2)

## 2018-07-24 LAB — CMP (CANCER CENTER ONLY)
ALT: 9 U/L (ref 0–44)
AST: 16 U/L (ref 15–41)
Albumin: 3.4 g/dL — ABNORMAL LOW (ref 3.5–5.0)
Alkaline Phosphatase: 101 U/L (ref 38–126)
Anion gap: 5 (ref 5–15)
BUN: 13 mg/dL (ref 8–23)
CO2: 33 mmol/L — ABNORMAL HIGH (ref 22–32)
Calcium: 8.3 mg/dL — ABNORMAL LOW (ref 8.9–10.3)
Chloride: 102 mmol/L (ref 98–111)
Creatinine: 0.77 mg/dL (ref 0.44–1.00)
GFR, Est AFR Am: >60 mL/min (ref >60)
GFR, Estimated: >60 mL/min (ref >60)
Glucose, Bld: 107 mg/dL — ABNORMAL HIGH (ref 70–99)
Potassium: 4.1 mmol/L (ref 3.5–5.1)
Sodium: 140 mmol/L (ref 135–145)
Total Bilirubin: 0.3 mg/dL (ref 0.3–1.2)
Total Protein: 6.2 g/dL — ABNORMAL LOW (ref 6.5–8.1)

## 2018-07-24 LAB — RETICULOCYTES
Immature Retic Fract: 9.4 % (ref 2.3–15.9)
RBC.: 3.01 MIL/uL — ABNORMAL LOW (ref 3.87–5.11)
Retic Count, Absolute: 62 10*3/uL (ref 19.0–186.0)
Retic Ct Pct: 2.1 % (ref 0.4–3.1)

## 2018-07-24 MED ORDER — DARBEPOETIN ALFA 300 MCG/0.6ML IJ SOSY
PREFILLED_SYRINGE | INTRAMUSCULAR | Status: AC
  Filled 2018-07-24: qty 0.6

## 2018-07-24 MED ORDER — HEPARIN SOD (PORK) LOCK FLUSH 100 UNIT/ML IV SOLN
500.0000 [IU] | Freq: Once | INTRAVENOUS | Status: AC
  Administered 2018-07-24: 500 [IU] via INTRAVENOUS
  Filled 2018-07-24: qty 5

## 2018-07-24 MED ORDER — SODIUM CHLORIDE 0.9% FLUSH
10.0000 mL | INTRAVENOUS | Status: DC | PRN
  Administered 2018-07-24: 10 mL via INTRAVENOUS
  Filled 2018-07-24: qty 10

## 2018-07-24 MED ORDER — DARBEPOETIN ALFA 300 MCG/0.6ML IJ SOSY
300.0000 ug | PREFILLED_SYRINGE | Freq: Once | INTRAMUSCULAR | Status: AC
  Administered 2018-07-24: 300 ug via SUBCUTANEOUS

## 2018-07-24 NOTE — Patient Instructions (Signed)

## 2018-07-24 NOTE — Progress Notes (Signed)
Hematology and Oncology Follow Up Visit  [name] [id number] 1945-06-11 73 y.o. 07/24/2018   Principle Diagnosis:  Iron deficiency anemia Erythropoietin deficient anemia   Current Therapy:   IV iron as indicated Aranesp 300 mcg sq q 3 wks for hgb < 11   Interim History:  [name] is here today for follow-up.  She has her supplemental oxygen tank with her.  She has bad underlying COPD.  She is feeling quite tired.  We did give her iron when we saw her in April.  Unfortunately, this really has not worked all that well.  I think we are going to have to start her on Aranesp.  Her erythropoietin level is only 6.4.  I talked her about this.  I do not see any reason why we cannot use erythropoietin on her.  She is on Eliquis.  Her blood pressure is doing fine.  She just wants to feel stronger.  She wants to have more energy.  She gets very very tired whenever she does anything around the house.  Having the underlying COPD is certainly a added problem.  Given her anemia, this really is making it hard on her quality of life.  Overall, her performance status is ECOG 2.     Medications:  Allergies as of 07/24/2018      Reactions   Dabigatran Etexilate Mesylate Other (See Comments)   INTERNAL BLEEDING-pradaxa   Talwin [pentazocine] Other (See Comments)   hallucinations   Lactate Rash      Medication List       Accurate as of Jul 24, 2018  2:13 PM. If you have any questions, ask your nurse or doctor.        acetaminophen 325 MG tablet Commonly known as:  TYLENOL Take 2 tablets (650 mg total) by mouth every 4 (four) hours as needed.   albuterol 108 (90 Base) MCG/ACT inhaler Commonly known as:  VENTOLIN HFA Inhale 2 puffs into the lungs every 6 (six) hours as needed (for cough, wheeze, chest congestion or shortness of breath).   albuterol 108 (90 Base) MCG/ACT inhaler Commonly known as:  ProAir HFA Inhale 2 puffs into the lungs every 6 (six) hours as needed for wheezing or  shortness of breath.   amLODipine 2.5 MG tablet Commonly known as:  NORVASC Take 1 tablet (2.5 mg total) by mouth daily.   atorvastatin 20 MG tablet Commonly known as:  LIPITOR Take 1 tablet (20 mg total) by mouth daily at 6 PM.   carvedilol 25 MG tablet Commonly known as:  COREG TAKE 1 TABLET BY MOUTH TWICE DAILY   denosumab 60 MG/ML Sosy injection Commonly known as:  PROLIA Inject 60 mg into the skin every 6 (six) months.   Eliquis 5 MG Tabs tablet Generic drug:  apixaban Take 1 tablet by mouth twice daily   furosemide 40 MG tablet Commonly known as:  LASIX TAKE ONE TABLET (40mg ) BY MOUTH IN THE MORNING   gabapentin 600 MG tablet Commonly known as:  NEURONTIN Take 1 tablet by mouth 4 times daily   lidocaine-prilocaine cream Commonly known as:  EMLA Apply 1 application topically as needed.   nitrofurantoin 100 MG capsule Commonly known as:  MACRODANTIN Take 100 mg by mouth daily.   nitroGLYCERIN 0.4 MG SL tablet Commonly known as:  NITROSTAT Place 1 tablet (0.4 mg total) under the tongue every 5 (five) minutes as needed. CHEST PAIN   oxyCODONE-acetaminophen 10-325 MG tablet Commonly known as:  PERCOCET Take 1  tablet by mouth every 4 (four) hours as needed. PAIN   pantoprazole 40 MG tablet Commonly known as:  PROTONIX Take 1 tablet by mouth twice daily   promethazine 25 MG tablet Commonly known as:  PHENERGAN TAKE ONE TABLET BY MOUTH EVERY 8 HOURS AS NEEDED FOR NAUSEA   Spiriva Respimat 1.25 MCG/ACT Aers Generic drug:  Tiotropium Bromide Monohydrate INHALE 2 PUFFS INTO THE LUNGS DAILY   tiZANidine 4 MG tablet Commonly known as:  ZANAFLEX Take 1 tablet by mouth three times daily as needed for muscle spasm   triamcinolone ointment 0.1 % Commonly known as:  KENALOG APPLY TO SKIN TWICE DAILY FOR 14 DAYS   zolpidem 5 MG tablet Commonly known as:  AMBIEN TAKE 1 TABLET BY MOUTH EVERY DAY AT BEDTIME AS NEEDED       Allergies:  Allergies  Allergen  Reactions  . Dabigatran Etexilate Mesylate Other (See Comments)    INTERNAL BLEEDING-pradaxa  . Talwin [Pentazocine] Other (See Comments)    hallucinations  . Lactate Rash    Past Medical History, Surgical history, Social history, and Family History were reviewed and updated.  Review of Systems: Review of Systems  Constitutional: Negative.   HENT: Negative.   Eyes: Negative.   Respiratory: Positive for shortness of breath.   Gastrointestinal: Negative.   Genitourinary: Negative.   Musculoskeletal: Negative.   Skin: Negative.   Neurological: Negative.   Endo/Heme/Allergies: Negative.   Psychiatric/Behavioral: Negative.       Physical Exam:  weight is 86 lb (39 kg). Her oral temperature is 98.7 F (37.1 C). Her blood pressure is 142/52 (abnormal) and her pulse is 64. Her respiration is 18 and oxygen saturation is 98%.   Wt Readings from Last 3 Encounters:  07/24/18 86 lb (39 kg)  06/19/18 87 lb (39.5 kg)  05/14/18 90 lb 3.2 oz (40.9 kg)    Physical Exam Vitals signs reviewed.  HENT:     Head: Normocephalic and atraumatic.  Eyes:     Pupils: Pupils are equal, round, and reactive to light.  Neck:     Musculoskeletal: Normal range of motion.  Cardiovascular:     Rate and Rhythm: Normal rate and regular rhythm.     Heart sounds: Normal heart sounds.  Pulmonary:     Effort: Pulmonary effort is normal.     Breath sounds: Normal breath sounds.  Abdominal:     General: Bowel sounds are normal.     Palpations: Abdomen is soft.  Musculoskeletal: Normal range of motion.        General: No tenderness or deformity.  Lymphadenopathy:     Cervical: No cervical adenopathy.  Skin:    General: Skin is warm and dry.     Findings: No erythema or rash.  Neurological:     Mental Status: She is alert and oriented to person, place, and time.  Psychiatric:        Behavior: Behavior normal.        Thought Content: Thought content normal.        Judgment: Judgment normal.       Lab Results  Component Value Date   WBC 6.8 07/24/2018   HGB 9.5 (L) 07/24/2018   HCT 29.9 (L) 07/24/2018   MCV 97.4 07/24/2018   PLT 245 07/24/2018   Lab Results  Component Value Date   FERRITIN 1,404 (H) 06/19/2018   IRON 19 (L) 06/19/2018   TIBC 199 (L) 06/19/2018   UIBC 180 06/19/2018   IRONPCTSAT 9 (L)  06/19/2018   Lab Results  Component Value Date   RETICCTPCT 2.1 07/24/2018   RBC 3.07 (L) 07/24/2018   RBC 3.01 (L) 07/24/2018   RETICCTABS 64.6 04/01/2009   No results found for: KPAFRELGTCHN, LAMBDASER, KAPLAMBRATIO No results found for: IGGSERUM, IGA, IGMSERUM No results found for: Odetta Pink, SPEI   Chemistry      Component Value Date/Time   NA 141 06/19/2018 1256   K 3.8 06/19/2018 1256   CL 102 06/19/2018 1256   CO2 32 06/19/2018 1256   BUN 33 (H) 06/19/2018 1256   CREATININE 1.00 06/19/2018 1256   CREATININE 0.91 03/16/2016 1539      Component Value Date/Time   CALCIUM 7.7 (L) 06/19/2018 1256   ALKPHOS 110 06/19/2018 1256   AST 27 06/19/2018 1256   ALT 38 06/19/2018 1256   BILITOT 0.4 06/19/2018 1256       Impression and Plan: [name] is a very pleasant 73 yo caucasian female with iron deficiency anemia secondary to malabsorption and possible GI blood loss with Eliquis.   We will go ahead and start her Aranesp today.  We also had to make sure we check her iron level.  I have believe that her iron is going to be okay since we just gave it to her about a month ago.  I want to have her come back in 3 weeks and we will see how her anemia is.  Hopefully, her hemoglobin will be close to 11 if not above.  Volanda Napoleon, MD 5/7/20202:13 PM

## 2018-07-25 ENCOUNTER — Telehealth: Payer: Self-pay | Admitting: Pulmonary Disease

## 2018-07-25 ENCOUNTER — Other Ambulatory Visit: Payer: Self-pay | Admitting: Pharmacy Technician

## 2018-07-25 ENCOUNTER — Encounter: Payer: Self-pay | Admitting: *Deleted

## 2018-07-25 LAB — IRON AND TIBC
Iron: 48 ug/dL (ref 41–142)
Saturation Ratios: 23 % (ref 21–57)
TIBC: 204 ug/dL — ABNORMAL LOW (ref 236–444)
UIBC: 156 ug/dL (ref 120–384)

## 2018-07-25 LAB — FERRITIN: Ferritin: 1053 ng/mL — ABNORMAL HIGH (ref 11–307)

## 2018-07-25 NOTE — Patient Outreach (Signed)
[facility]) Care Management  07/25/2018  [name] 05-01-1945 [id number]   Care coordination call placed to BI in regards to patient's application for Spiriva Respimat.  Spoke to [name] who said patient was denied due to being over income. Inquired how much patient was over and he said not by that much. His suggestion was that we appeal the decision by obtaining the out of pocket cost for this medication and then submit it via fax to [phone].  Will contact [employer] to request the printout and will fax to [facility] when back in the office on Tuesday 07/29/2018. Patient is aware that we are appealing the decision.  Telford Archambeau P. Cierrah Dace, [place] Management [phone]

## 2018-07-25 NOTE — Telephone Encounter (Signed)
Alleen Borne did you scan the original forms?  Snipes, Sonya K, CMA     10:52 AM  Note    Note    rec'd paperwork this morning in VS cubby VS is out of the office this week; B.Mack seen pt prior B.Mack signed paperwork and faxed back to [facility] today Nothing further needed at this time.     Documented 07/21/18 Amparo Bristol

## 2018-07-25 NOTE — Telephone Encounter (Signed)
Called patient let her know we received fax from [employer] stating she denied patient assistance because she was over income.  Patient verbalized understanding.  Nothing further needed.

## 2018-07-25 NOTE — Telephone Encounter (Signed)
[name] that message was copied from a duplicate message [name] had handled. That documentation on 5/4 was from [facility].

## 2018-07-25 NOTE — Patient Outreach (Signed)
[facility]) Care Management  07/25/2018  RICKETTA COLANTONIO [birth year] [id number]    Care coordination call placed to [facility] at Dr Juanetta Gosling office.  Informed Kim that we received the fax application as stated in the notes but have not received the Merck application. Informed Kim the [employer] application has to be sent inter office. Maudie Mercury will send message back to nurses.  Berdell Nevitt P. Oliviarose Punch, [place] Management [phone]

## 2018-07-28 ENCOUNTER — Other Ambulatory Visit: Payer: Self-pay | Admitting: Pharmacy Technician

## 2018-07-28 NOTE — Patient Outreach (Signed)
[facility]) Care Management  07/28/2018  [name] Feb 20, 1946 [id number]   Care coordination call placed to BMS in regards to Eliquis application.  Spoke to [employer] who informed based on patient's income submitted she has not met the 3% OOP required for BMS patient assistance and therefore she has been DENIED patient assistance with BMS for Eliquis.   Per Humberto Leep, with the patient's submitted OOP plus a credit extended to her by BMS, patient will still need to spend 602.94. Murlyn informed that if the only OOP spend we reported was the patient's, then we can also report the OOP spend for the other 3 members of the household provided they get prescription medications.  Will route note to [place] notifying her of the patient's status.  Braeleigh Pyper P. Ruben Mahler, [place] Management [phone]

## 2018-07-29 ENCOUNTER — Encounter: Payer: Self-pay | Admitting: Pharmacy Technician

## 2018-07-29 ENCOUNTER — Ambulatory Visit (INDEPENDENT_AMBULATORY_CARE_PROVIDER_SITE_OTHER): Payer: HMO | Admitting: *Deleted

## 2018-07-29 ENCOUNTER — Other Ambulatory Visit: Payer: Self-pay | Admitting: Pharmacy Technician

## 2018-07-29 ENCOUNTER — Other Ambulatory Visit: Payer: Self-pay

## 2018-07-29 DIAGNOSIS — I480 Paroxysmal atrial fibrillation: Secondary | ICD-10-CM | POA: Diagnosis not present

## 2018-07-29 DIAGNOSIS — I428 Other cardiomyopathies: Secondary | ICD-10-CM

## 2018-07-29 NOTE — Patient Outreach (Signed)
[facility]) Care Management  07/29/2018  [name] 09/23/1945 [id number]    Successful outgoing call placed to patient in regards to BMS application for Eliquis, BI application for Spiriva and Merck application for Proventil HFA.  Spoke to patient, HIPAA identifiers verified.  Informed patient that she was short 605 dollars with her out of pocket spend to qualify for BMS patient assistance. Informed patient that Murlyn from [place] said the patient could submit other household member's OOP spend (she listed 4 total people in her household). Informed patient that she would have to obtain the reports from the pharmacy that those household members use. Patient verbalized understanding and was agreeable. Informed patient  I could mail her an envelope to send the reports to me.  Prepared and mailed letter with enclosed envelope to patient for BMS OOP spend for the Eliquis application.  Informed patient that I sent in the appeal request to [facility] for Spiriva based on the HTA report for what the patient's copay would be for the medication. Informed patient I would followup with her.  Re sent for the 3rd time to [facility] via inter office the provider portion of the Merck application for Proventil HFA.  Will followup with patient, company and provider in 5-14 business days.  Vicy Medico P. Kruze Atchley, [place] Management [phone]

## 2018-07-30 LAB — CUP PACEART REMOTE DEVICE CHECK
Battery Remaining Longevity: 7 mo
Battery Voltage: 2.85 V
Brady Statistic AP VP Percent: 0.98 %
Brady Statistic AP VS Percent: 0 %
Brady Statistic AS VP Percent: 98.98 %
Brady Statistic AS VS Percent: 0.04 %
Brady Statistic RA Percent Paced: 0.97 %
Brady Statistic RV Percent Paced: 99.94 %
Date Time Interrogation Session: 20200512202911
Implantable Lead Implant Date: 20111005
Implantable Lead Implant Date: 20111005
Implantable Lead Implant Date: 20111005
Implantable Lead Location: 753858
Implantable Lead Location: 753859
Implantable Lead Location: 753860
Implantable Lead Model: 4196
Implantable Lead Model: 5076
Implantable Lead Model: 5076
Implantable Pulse Generator Implant Date: 20111005
Lead Channel Impedance Value: 342 Ohm
Lead Channel Impedance Value: 380 Ohm
Lead Channel Impedance Value: 418 Ohm
Lead Channel Impedance Value: 437 Ohm
Lead Channel Impedance Value: 456 Ohm
Lead Channel Impedance Value: 494 Ohm
Lead Channel Impedance Value: 494 Ohm
Lead Channel Impedance Value: 513 Ohm
Lead Channel Impedance Value: 703 Ohm
Lead Channel Pacing Threshold Amplitude: 0.5 V
Lead Channel Pacing Threshold Amplitude: 0.5 V
Lead Channel Pacing Threshold Pulse Width: 0.4 ms
Lead Channel Pacing Threshold Pulse Width: 0.4 ms
Lead Channel Sensing Intrinsic Amplitude: 1.125 mV
Lead Channel Sensing Intrinsic Amplitude: 1.125 mV
Lead Channel Sensing Intrinsic Amplitude: 6.25 mV
Lead Channel Sensing Intrinsic Amplitude: 8 mV
Lead Channel Setting Pacing Amplitude: 1.25 V
Lead Channel Setting Pacing Amplitude: 2 V
Lead Channel Setting Pacing Amplitude: 2.5 V
Lead Channel Setting Pacing Pulse Width: 0.4 ms
Lead Channel Setting Pacing Pulse Width: 0.8 ms
Lead Channel Setting Sensing Sensitivity: 0.9 mV

## 2018-07-31 IMAGING — DX DG CHEST 2V
2 series · 2 of 2 positions shown · non-contrast
Comparison: 08/15/2017

CLINICAL DATA: Cough with mucus AND sob x 1 wk, pt on oxygen, pt
had PNA x 2 mos ago, HTN, x-smoker, hx of COPD, CAD, CHF, afib.

EXAM:
CHEST - 2 VIEW

[chest pa]
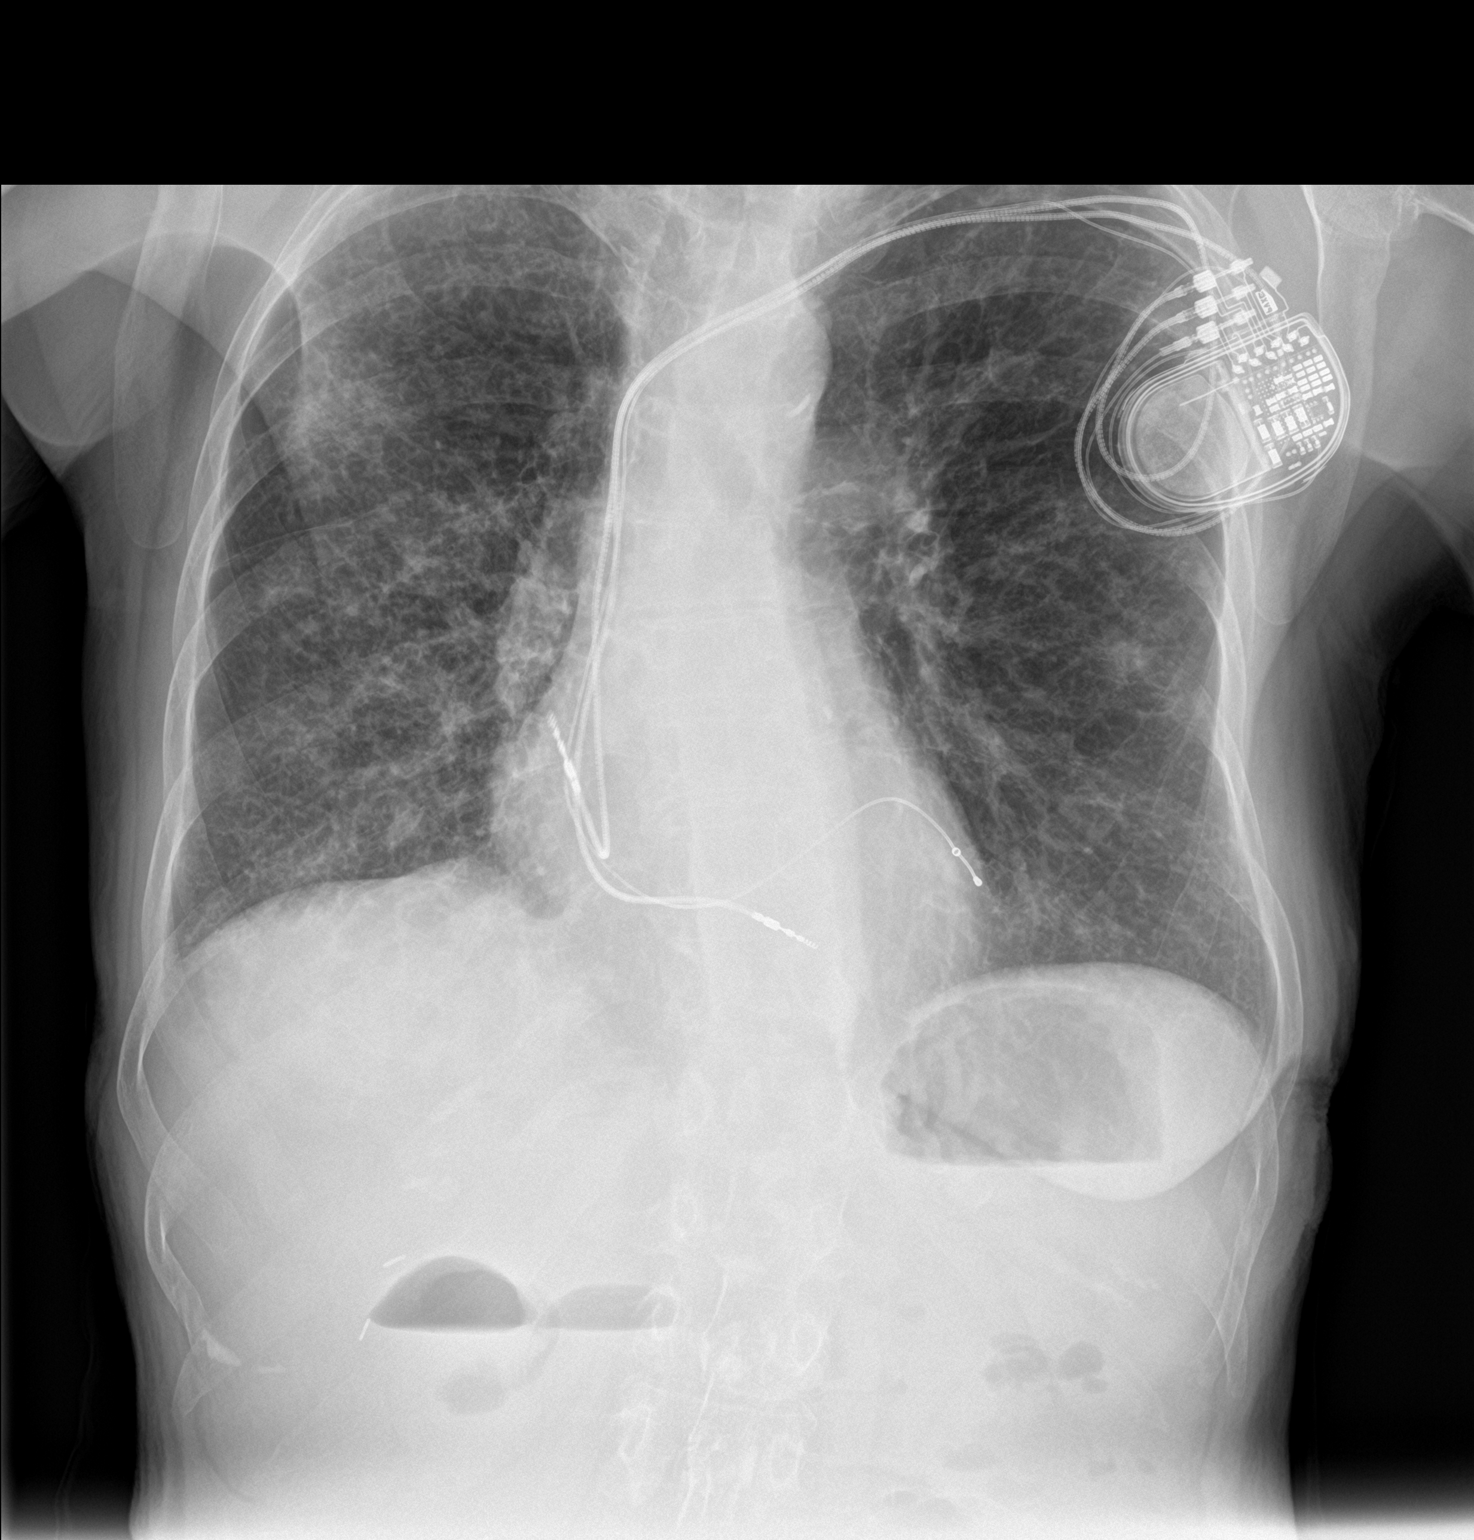

[chest lat]
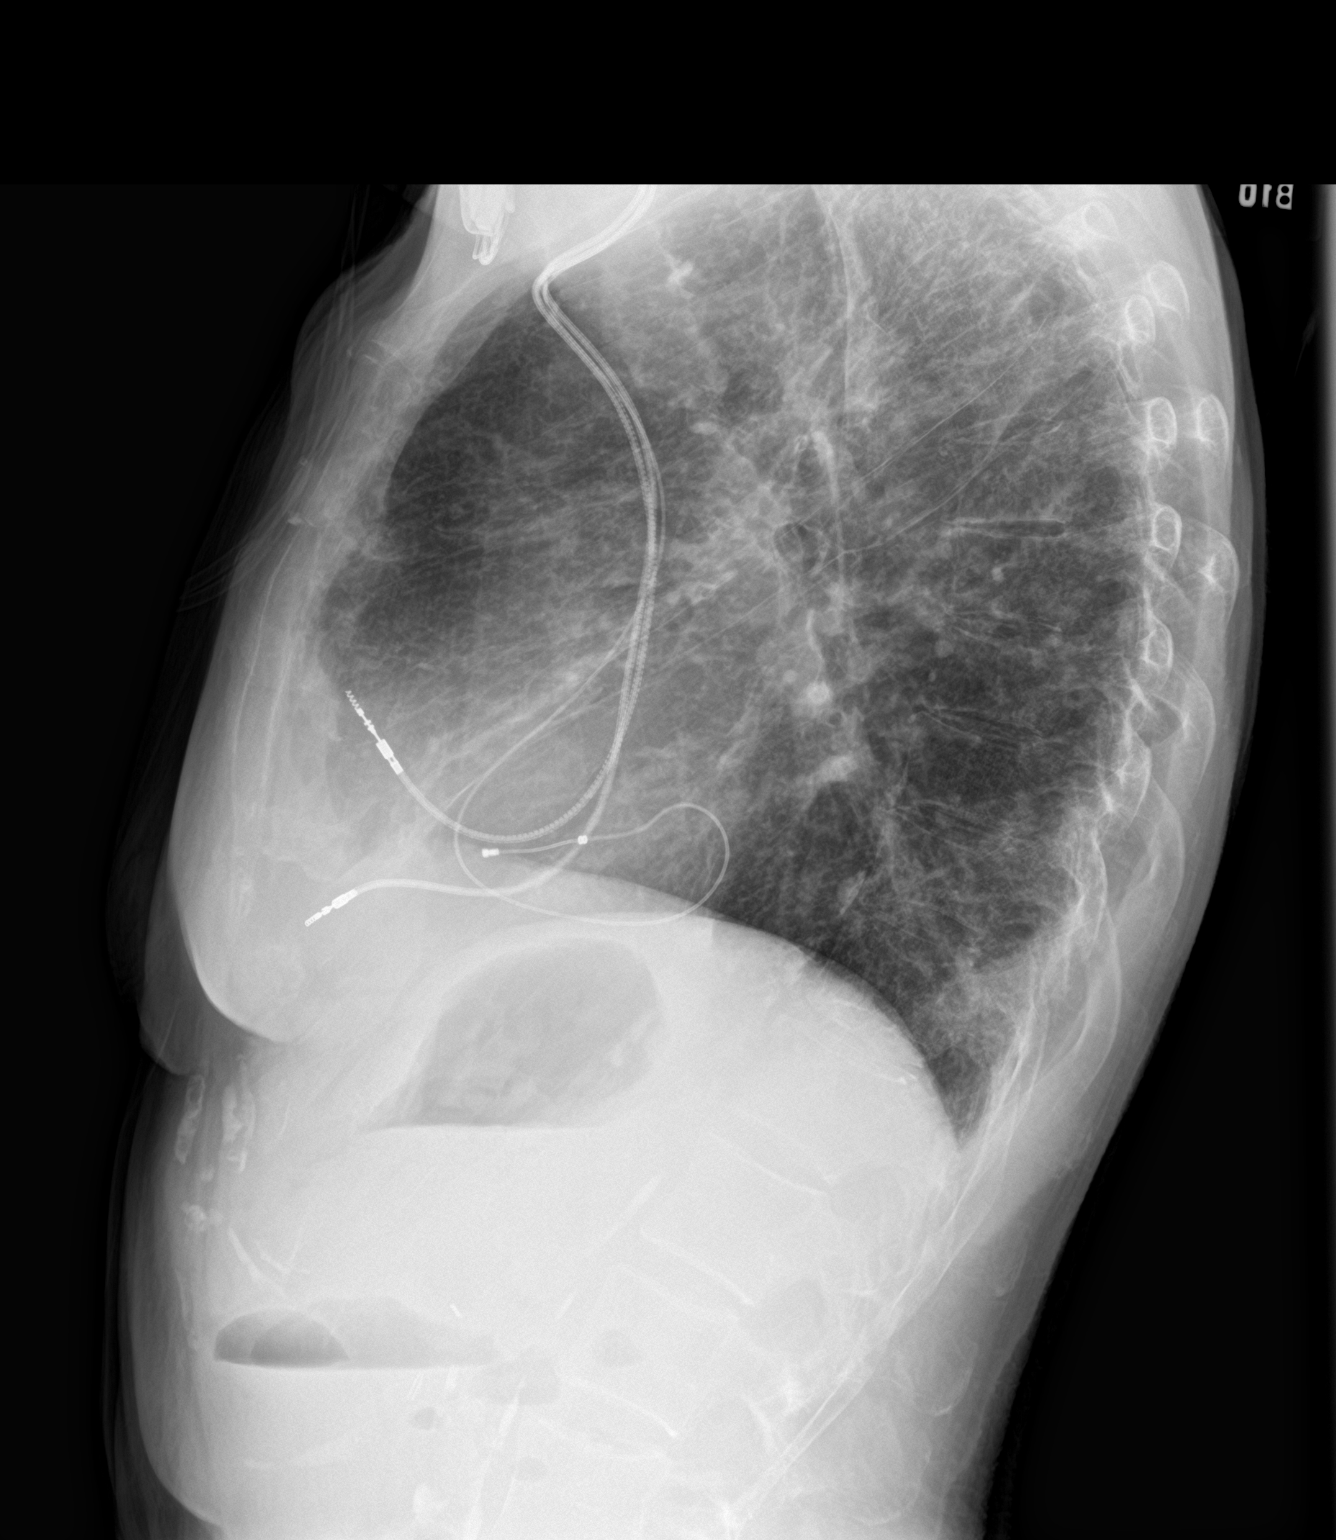

[2 of 2 positions shown; findings below may reference images not displayed]

FINDINGS: Stable pacer. Stable somewhat coarse interstitial accentuation in
the lungs. Vague nodularity in the left mid lung, about 1.1 cm in
diameter. Atherosclerotic calcification of the aortic arch. Mild
S-shaped thoracolumbar scoliosis. No pleural effusion. Stable
confluence of density in the right upper lobe. Improvement in the
right lower lobe airspace opacity.
IMPRESSION: 1. Improvement in the right lower lobe airspace opacity.
2. Stable abnormal interstitial accentuation in the lungs. There is
some confluence in the right upper lung and left mid lung, such that
pulmonary nodules are not readily excluded.
3.  Aortic Atherosclerosis ([id number].L).

## 2018-08-01 ENCOUNTER — Telehealth: Payer: Self-pay | Admitting: Orthopaedic Surgery

## 2018-08-01 ENCOUNTER — Other Ambulatory Visit: Payer: Self-pay | Admitting: Pharmacy Technician

## 2018-08-01 NOTE — Telephone Encounter (Signed)
Patient called in regarding the status of her MRI

## 2018-08-01 NOTE — Patient Outreach (Signed)
[facility]) Care Management  08/01/2018  [name] 03-22-1945 [id number]   Care coordination call placed to patient in regards to [facility] cares application for Spiriva.  Spoke to [name] who informed that based on patient's income, number of members in the household and the cost of the medication the  APPEAL is DENIED due to being over income and medication being less then 3% of spending net.  Will route note to [place] for followup.  [name], [place] Management [phone]

## 2018-08-04 ENCOUNTER — Other Ambulatory Visit: Payer: Self-pay

## 2018-08-04 ENCOUNTER — Encounter: Payer: Self-pay | Admitting: Family Medicine

## 2018-08-04 ENCOUNTER — Ambulatory Visit (INDEPENDENT_AMBULATORY_CARE_PROVIDER_SITE_OTHER): Payer: HMO | Admitting: Family Medicine

## 2018-08-04 DIAGNOSIS — G4700 Insomnia, unspecified: Secondary | ICD-10-CM

## 2018-08-04 DIAGNOSIS — E785 Hyperlipidemia, unspecified: Secondary | ICD-10-CM

## 2018-08-04 DIAGNOSIS — R52 Pain, unspecified: Secondary | ICD-10-CM | POA: Diagnosis not present

## 2018-08-04 DIAGNOSIS — G459 Transient cerebral ischemic attack, unspecified: Secondary | ICD-10-CM

## 2018-08-04 MED ORDER — OXYCODONE-ACETAMINOPHEN 10-325 MG PO TABS: 1.0000 | ORAL_TABLET | ORAL | 0 refills | Status: DC | PRN

## 2018-08-04 MED ORDER — ATORVASTATIN CALCIUM 20 MG PO TABS: 20.0000 mg | ORAL_TABLET | Freq: Every day | ORAL | 1 refills | Status: DC

## 2018-08-04 MED ORDER — TIZANIDINE HCL 4 MG PO TABS: 4.0000 mg | ORAL_TABLET | Freq: Three times a day (TID) | ORAL | 0 refills | Status: DC | PRN

## 2018-08-04 MED ORDER — GABAPENTIN 600 MG PO TABS: ORAL_TABLET | ORAL | 1 refills | Status: DC

## 2018-08-04 MED ORDER — ZOLPIDEM TARTRATE 5 MG PO TABS: ORAL_TABLET | ORAL | 0 refills | Status: DC

## 2018-08-04 MED ORDER — APIXABAN 5 MG PO TABS: 5.0000 mg | ORAL_TABLET | Freq: Two times a day (BID) | ORAL | 0 refills | Status: DC

## 2018-08-04 NOTE — Progress Notes (Signed)
Virtual Visit via Video Note  I connected with [name] on 08/04/18 at  2:15 PM EDT by a video enabled telemedicine application and verified that I am speaking with the correct person using two identifiers.  Location: Patient: home Provider: home   I discussed the limitations of evaluation and management by telemedicine and the availability of in person appointments. The patient expressed understanding and agreed to proceed.  History of Present Illness: Pt is home -- needs refills on meds.  She will schedule a lab app.  She is seeing ortho for ? Herniated disc.  They are unable to do MRI due to pacemaker.   No other complaints.  Pt seeing hematology regularly    Observations/Objective:  Vitals:   08/04/18 1313  Temp: 98.4 F (36.9 C)  pt in NAD    Assessment and Plan: 1. Hyperlipidemia LDL goal <100 Encouraged heart healthy diet, increase exercise, avoid trans fats, consider a krill oil cap daily - atorvastatin (LIPITOR) 20 MG tablet; Take 1 tablet (20 mg total) by mouth daily at 6 PM.  Dispense: 90 tablet; Refill: 1 - Lipid panel; Future - Comprehensive metabolic panel; Future  2. TIA (transient ischemic attack) Stable con't eliquis - apixaban (ELIQUIS) 5 MG TABS tablet; Take 1 tablet (5 mg total) by mouth 2 (two) times daily.  Dispense: 180 tablet; Refill: 0  3. Pain Worsening--- ? Herniated disc F/u ortho Needs uds and contract next visit  - gabapentin (NEURONTIN) 600 MG tablet; Take 1 tablet by mouth 4 times daily  Dispense: 120 tablet; Refill: 1 - tiZANidine (ZANAFLEX) 4 MG tablet; Take 1 tablet (4 mg total) by mouth 3 (three) times daily as needed. for muscle spams  Dispense: 90 tablet; Refill: 0 - oxyCODONE-acetaminophen (PERCOCET) 10-325 MG tablet; Take 1 tablet by mouth every 4 (four) hours as needed. PAIN  Dispense: 120 tablet; Refill: 0  4. Insomnia, unspecified type Stable  - zolpidem (AMBIEN) 5 MG tablet; 1 po qhs prn  Dispense: 30 tablet; Refill:  0   Follow Up Instructions:    I discussed the assessment and treatment plan with the patient. The patient was provided an opportunity to ask questions and all were answered. The patient agreed with the plan and demonstrated an understanding of the instructions.   The patient was advised to call back or seek an in-person evaluation if the symptoms worsen or if the condition fails to improve as anticipated.  I provided 25 minutes of non-face-to-face time during this encounter.   Ann Held, DO

## 2018-08-05 ENCOUNTER — Telehealth: Payer: Self-pay | Admitting: Pharmacist

## 2018-08-05 NOTE — Patient Outreach (Signed)
[facility] [facility]) Care Management  08/05/2018  [name] 11/22/1945 [id number]   Called patient to tell her that she had been denied for Jardiance through [employer].  HIPAA identifiers were obtained. Patient said she did have additional household medication spending that would count for [employer] (Eliquis).  She said her husband put the information in the mail to come back to [facility] today.  Sharee Pimple Simcox notified via phone.   Plan: Call patient back in 5-6 weeks for follow up. Sharee Pimple Simcox will follow in the meantime.   Elayne Guerin, PharmD, [place] Clinical Pharmacist [phone]

## 2018-08-06 ENCOUNTER — Telehealth: Payer: Self-pay | Admitting: Orthopaedic Surgery

## 2018-08-06 NOTE — Telephone Encounter (Signed)
Pt called in says she had an appt set up with [facility] to have her mri done but due to her having a pace maker they can not do the mri so they told her they would contact dr.blackman and since then she has not heard back from anyone and has attempted to call but as well nothing. Pt says she is in a lot of pain and would like for someone to give her a call about her mri. [phone]

## 2018-08-06 NOTE — Telephone Encounter (Signed)
See below, do a CT?

## 2018-08-07 ENCOUNTER — Telehealth: Payer: Self-pay | Admitting: Pharmacist

## 2018-08-07 NOTE — Patient Outreach (Signed)
[facility]) Care Management  08/07/2018  [name] December 19, 1945 [id number]   Called patient to see if she would be willing to switch to Incruse since it is in the same drug class family as Spiriva. (Of course any therapeutic substitution would have to be approved by the patient's provider).  Patient said she would rather submit the new financial information she is sending to [facility] and wait to see if an appeal can go through since she has been on Spiriva for so long.  Plan: Route note to [employer], CPhT. Follow up with patient at 09/01/2018.  Elayne Guerin, PharmD, [place] Clinical Pharmacist [phone]

## 2018-08-08 ENCOUNTER — Other Ambulatory Visit: Payer: Self-pay | Admitting: Orthopaedic Surgery

## 2018-08-08 ENCOUNTER — Other Ambulatory Visit: Payer: Self-pay | Admitting: Pulmonary Disease

## 2018-08-08 DIAGNOSIS — G8929 Other chronic pain: Secondary | ICD-10-CM

## 2018-08-08 NOTE — Telephone Encounter (Signed)
ic and left vm with Roberta with [facility] and she will check to see about getting pt scheduled. I advised this was a stat order.

## 2018-08-11 DIAGNOSIS — I4891 Unspecified atrial fibrillation: Secondary | ICD-10-CM | POA: Diagnosis not present

## 2018-08-11 DIAGNOSIS — J449 Chronic obstructive pulmonary disease, unspecified: Secondary | ICD-10-CM | POA: Diagnosis not present

## 2018-08-12 ENCOUNTER — Other Ambulatory Visit: Payer: Self-pay | Admitting: Pharmacy Technician

## 2018-08-12 ENCOUNTER — Telehealth: Payer: Self-pay

## 2018-08-12 IMAGING — XA IR FLUORO GUIDE CV LINE*L*
1 series · 1 of 1 positions shown · non-contrast
Comparison: Chest CT - 03/28/2017

INDICATION: Poor venous access. In need of durable intravenous access for
frequent blood draws and medication administration.

EXAM:
IMPLANTED PORT A CATH PLACEMENT WITH ULTRASOUND AND FLUOROSCOPIC
GUIDANCE

[Series 300: ir perc tun perit cath w/port s &i /imag · 1 of 1 slices shown]
[im 1/1]
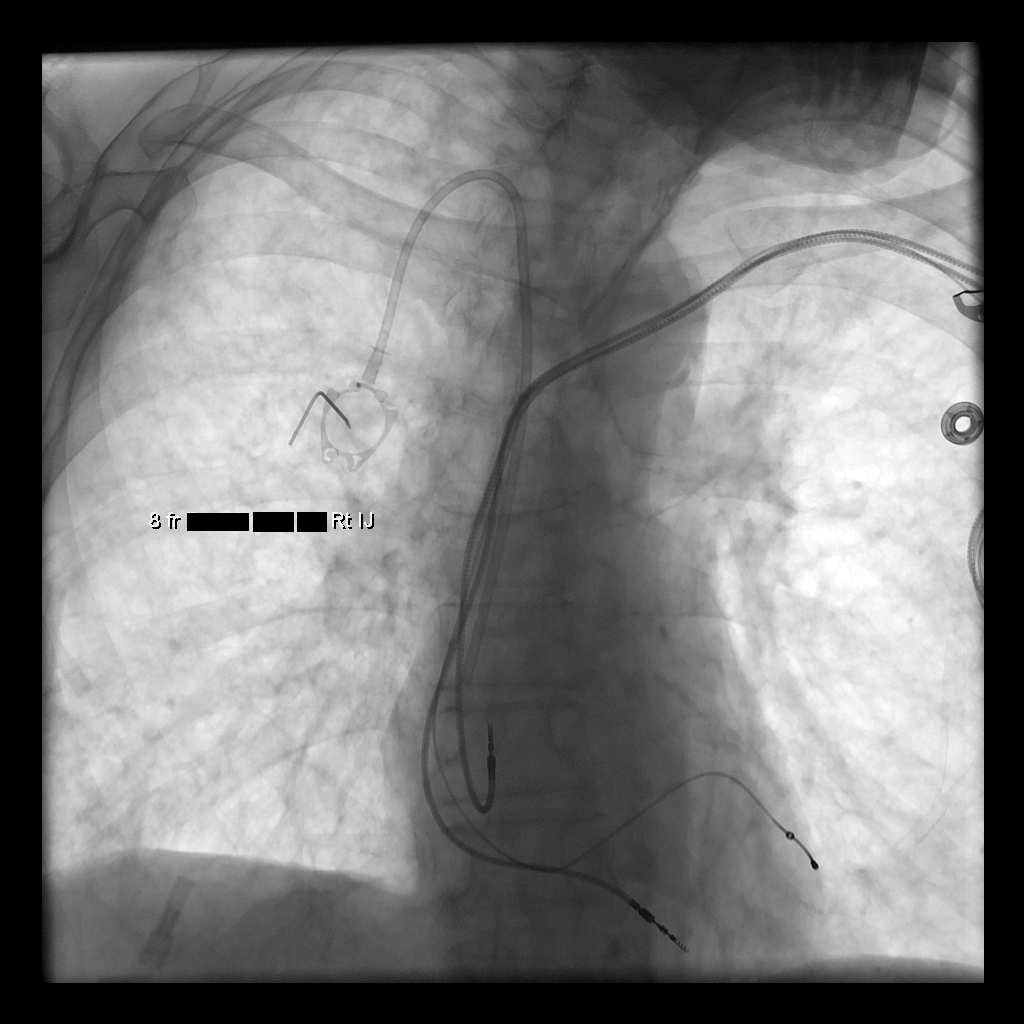

[1 of 1 positions shown; findings below may reference images not displayed]

MEDICATIONS:
Ancef 2 gm IV; The antibiotic was administered within an appropriate
time interval prior to skin puncture.

ANESTHESIA/SEDATION:
Moderate (conscious) sedation was employed during this procedure. A
total of Versed 3 mg and Fentanyl 200 mcg was administered
intravenously.

Moderate Sedation Time: 27 minutes. The patient's level of
consciousness and vital signs were monitored continuously by
radiology nursing throughout the procedure under my direct
supervision.

CONTRAST:  None

FLUOROSCOPY TIME:  48 seconds (6 mGy)

COMPLICATIONS:
None immediate.

PROCEDURE:
The procedure, risks, benefits, and alternatives were explained to
the patient. Questions regarding the procedure were encouraged and
answered. The patient understands and consents to the procedure.

The right neck and chest were prepped with chlorhexidine in a
sterile fashion, and a sterile drape was applied covering the
operative field. Maximum barrier sterile technique with sterile
gowns and gloves were used for the procedure. A timeout was
performed prior to the initiation of the procedure. Local anesthesia
was provided with 1% lidocaine with epinephrine.

After creating a small venotomy incision, a micropuncture kit was
utilized to access the internal jugular vein. Real-time ultrasound
guidance was utilized for vascular access including the acquisition
of a permanent ultrasound image documenting patency of the accessed
vessel. The microwire was utilized to measure appropriate catheter
length.

A subcutaneous port pocket was then created along the upper chest
wall utilizing a combination of sharp and blunt dissection. The
pocket was irrigated with sterile saline. A single lumen thin power
injectable port was chosen for placement. The 8 Fr catheter was
tunneled from the port pocket site to the venotomy incision. The
port was placed in the pocket. The external catheter was trimmed to
appropriate length. At the venotomy, an 8 Fr peel-away sheath was
placed over a guidewire under fluoroscopic guidance. The catheter
was then placed through the sheath and the sheath was removed. Final
catheter positioning was confirmed and documented with a
fluoroscopic spot radiograph. The port was accessed with Binta Jovel
needle, aspirated and flushed with heparinized saline.

The venotomy site was closed with an interrupted 4-0 Vicryl suture.
The port pocket incision was closed with interrupted 2-0 Vicryl
suture and the skin was opposed with a running subcuticular 4-0
Vicryl suture. Dermabond and Felner were applied to both
incisions. Dressings were placed. The patient tolerated the
procedure well without immediate post procedural complication.
FINDINGS: After catheter placement, the tip lies within the superior
cavoatrial junction. The catheter aspirates and flushes normally and
is ready for immediate use.
IMPRESSION: Successful placement of a right internal jugular approach power
injectable Port-A-Cath. The catheter is ready for immediate use.

## 2018-08-12 NOTE — Patient Outreach (Signed)
[facility]) Care Management  08/12/2018  JERRINE URSCHEL 10-02-1945 [id number]    Received both patient and provider portions back for [employer] application for Proventil HFA.  Submitted completed application via mail to [employer].  Will followup with [employer] in 10-14 business days to inquire if application was received and attestation letter was mailed out.  Mickeal Daws P. Kathyann Spaugh, [place] Management [phone]

## 2018-08-12 NOTE — Telephone Encounter (Signed)
Spoke with patient to screen her medications and drug allergies prior to scheduling her for a myelogram.  She was informed she will be here 2-2.5 hours, needs a driver and will need to be on strict bedrest for 24 hours after the procedure.  She also was informed she needs to hold Eliquis for two days once we receive clearance from her doctor (Dr. Carollee Herter), and Phenergan needs to be held for 48 hours before, and 24 hours after, the procedure.

## 2018-08-13 ENCOUNTER — Other Ambulatory Visit: Payer: Self-pay | Admitting: Pharmacy Technician

## 2018-08-13 NOTE — Patient Outreach (Signed)
[name] [facility]) Care Management  08/13/2018  [name] Aug 10, 1945 [id number]    Incoming call from patient. Unfortunately I was on another call and could not answer the call.   Patient left a voicemail inquiring about the jump in the price of the Spiriva and was now possibly interested in making the switch to another medication as was discussed with her last week with [place].  Will forward message to [place] to outreach patient.  Nai Borromeo P. Hollyn Stucky, [place] Management [phone]

## 2018-08-13 NOTE — Progress Notes (Signed)
Remote pacemaker transmission.   

## 2018-08-14 ENCOUNTER — Other Ambulatory Visit: Payer: Self-pay | Admitting: Pharmacy Technician

## 2018-08-14 ENCOUNTER — Telehealth: Payer: Self-pay | Admitting: Pulmonary Disease

## 2018-08-14 ENCOUNTER — Other Ambulatory Visit: Payer: Self-pay | Admitting: Pharmacist

## 2018-08-14 NOTE — Patient Outreach (Addendum)
[facility]) Care Management  08/14/2018  [name] 1945/12/15 [id number]  Patient called [name], CPhT stating that she is now interested in asking her provider about switching to Incruse because when she went to pick up her Spiriva inhaler, the copay was $115.    Patient said the pharmacist told her she was in the "donut hole" but the patient questioned how that could be possible since it is only May and she usually does not reach the donut hole until October or November.  Patient's insurance, ([employer]) was called and the following information was gleaned:    Patient's out-of-pocket expenditure to this point: $864.52  Total Drug spend (patient plus insurance)-$4685.49 --the donut hole is reached when patients' medication spend reaches $4020 in total drug cost.  Spiriva Respiclick- 09/12/9483- $462 copay  cost $346 (retail cost of 1 inhaler)  Eliquis 90 day supply 06/2018  $1240 (retail cost).   Patient should qualify for [employer]'s Patient Assistance Program for Incruse (LAMA) and Ventolin HFA.   Plan: Send note to Dr. Halford Chessman about switching the patient from Spiriva to Incruse. Mail patient a Media planner for [employer]. Follow up with the patient in 4-6 weeks.   Elayne Guerin, PharmD, [place] Clinical Pharmacist [phone]

## 2018-08-14 NOTE — Telephone Encounter (Signed)
Spoke with pt, advised her that we did have samples of Spriva 1.47mcg. I advised her that I would leave the samples up front for her to pick up. Pt understood and nothing further is needed.

## 2018-08-14 NOTE — Patient Outreach (Signed)
[facility]) Care Management  08/14/2018  AMELIYA NICOTRA [birth year] [id number]                          Medication Assistance Referral  Referral From: [place]  Medication/Company: Keenan Bachelor / [place] Patient application portion:  Mailed Provider application portion: Faxed  to Dr. Vennie Homans, NP  Medication/Company: [place] Cutter HFA / [place] Patient application portion:  Mailed Provider application portion: Faxed  to Dr. Vennie Homans, NP    Follow up:  Will follow up with patient in 5-10 business days to confirm application(s) have been received.  Tanyah Debruyne P. Epic Tribbett, [place] Management [phone]

## 2018-08-15 ENCOUNTER — Telehealth: Payer: Self-pay | Admitting: Pulmonary Disease

## 2018-08-15 ENCOUNTER — Ambulatory Visit (INDEPENDENT_AMBULATORY_CARE_PROVIDER_SITE_OTHER): Payer: HMO | Admitting: *Deleted

## 2018-08-15 DIAGNOSIS — I480 Paroxysmal atrial fibrillation: Secondary | ICD-10-CM

## 2018-08-15 MED ORDER — UMECLIDINIUM BROMIDE 62.5 MCG/INH IN AEPB: 1.0000 | INHALATION_SPRAY | Freq: Every day | RESPIRATORY_TRACT | 12 refills | Status: DC

## 2018-08-15 MED ORDER — ALBUTEROL SULFATE HFA 108 (90 BASE) MCG/ACT IN AERS: 2.0000 | INHALATION_SPRAY | Freq: Four times a day (QID) | RESPIRATORY_TRACT | 12 refills | Status: DC | PRN

## 2018-08-15 NOTE — Telephone Encounter (Signed)
Agree with patient stopping Spiriva and starting Incruse Ellipta.  She will need to be taught how to use that inhaler.  Also agree with the Ventolin prescription being sent.  This is great that they will be able to get this covered with [place].If patient has any difficulty obtaining these medications have her contact our office.  So we can follow-up and work with her on this.Continue to work with [facility] for medication cost.[name]

## 2018-08-15 NOTE — Telephone Encounter (Signed)
Called and spoke with patient she will come in next week to learn how to use Incruse. She can receive a sample at this time while learning.  Thank you nothing further needed.

## 2018-08-15 NOTE — Telephone Encounter (Signed)
Received a fax from [facility] for patient assistance eligibility for patient for Incruse and Ventolin.  I called patient to see if she wanted to start these medications. Patient states she want to switch to Incruse instead of spiriva due to her in the donut hole and increased cost.  incruse and ventolin  Rx sent.  Spiriva removed from medication list.  Will route to [employer] and Wyn Quaker as Juluis Rainier

## 2018-08-16 LAB — CUP PACEART REMOTE DEVICE CHECK
Battery Remaining Longevity: 6 mo
Battery Voltage: 2.84 V
Brady Statistic AP VP Percent: 0.6 %
Brady Statistic AP VS Percent: 0 %
Brady Statistic AS VP Percent: 99.36 %
Brady Statistic AS VS Percent: 0.04 %
Brady Statistic RA Percent Paced: 0.59 %
Brady Statistic RV Percent Paced: 99.93 %
Date Time Interrogation Session: 20200529185835
Implantable Lead Implant Date: 20111005
Implantable Lead Implant Date: 20111005
Implantable Lead Implant Date: 20111005
Implantable Lead Location: 753858
Implantable Lead Location: 753859
Implantable Lead Location: 753860
Implantable Lead Model: 4196
Implantable Lead Model: 5076
Implantable Lead Model: 5076
Implantable Pulse Generator Implant Date: 20111005
Lead Channel Impedance Value: 323 Ohm
Lead Channel Impedance Value: 380 Ohm
Lead Channel Impedance Value: 399 Ohm
Lead Channel Impedance Value: 418 Ohm
Lead Channel Impedance Value: 437 Ohm
Lead Channel Impedance Value: 475 Ohm
Lead Channel Impedance Value: 494 Ohm
Lead Channel Impedance Value: 494 Ohm
Lead Channel Impedance Value: 684 Ohm
Lead Channel Pacing Threshold Amplitude: 0.5 V
Lead Channel Pacing Threshold Amplitude: 0.5 V
Lead Channel Pacing Threshold Pulse Width: 0.4 ms
Lead Channel Pacing Threshold Pulse Width: 0.4 ms
Lead Channel Sensing Intrinsic Amplitude: 0.875 mV
Lead Channel Sensing Intrinsic Amplitude: 0.875 mV
Lead Channel Sensing Intrinsic Amplitude: 6.25 mV
Lead Channel Sensing Intrinsic Amplitude: 8 mV
Lead Channel Setting Pacing Amplitude: 1.25 V
Lead Channel Setting Pacing Amplitude: 2 V
Lead Channel Setting Pacing Amplitude: 2.5 V
Lead Channel Setting Pacing Pulse Width: 0.4 ms
Lead Channel Setting Pacing Pulse Width: 0.8 ms
Lead Channel Setting Sensing Sensitivity: 0.9 mV

## 2018-08-18 ENCOUNTER — Ambulatory Visit: Payer: Self-pay | Admitting: Pharmacist

## 2018-08-18 ENCOUNTER — Encounter: Payer: Self-pay | Admitting: Hematology & Oncology

## 2018-08-18 ENCOUNTER — Other Ambulatory Visit: Payer: Self-pay

## 2018-08-18 ENCOUNTER — Inpatient Hospital Stay (HOSPITAL_BASED_OUTPATIENT_CLINIC_OR_DEPARTMENT_OTHER): Payer: HMO | Admitting: Hematology & Oncology

## 2018-08-18 ENCOUNTER — Inpatient Hospital Stay: Payer: HMO | Attending: Family

## 2018-08-18 ENCOUNTER — Other Ambulatory Visit (INDEPENDENT_AMBULATORY_CARE_PROVIDER_SITE_OTHER): Payer: HMO

## 2018-08-18 ENCOUNTER — Inpatient Hospital Stay: Payer: HMO

## 2018-08-18 VITALS — BP 116/60 | HR 67 | Temp 98.3°F | Resp 19 | Wt 86.0 lb

## 2018-08-18 DIAGNOSIS — D508 Other iron deficiency anemias: Secondary | ICD-10-CM

## 2018-08-18 DIAGNOSIS — Z79899 Other long term (current) drug therapy: Secondary | ICD-10-CM

## 2018-08-18 DIAGNOSIS — N189 Chronic kidney disease, unspecified: Secondary | ICD-10-CM

## 2018-08-18 DIAGNOSIS — K909 Intestinal malabsorption, unspecified: Secondary | ICD-10-CM

## 2018-08-18 DIAGNOSIS — D500 Iron deficiency anemia secondary to blood loss (chronic): Secondary | ICD-10-CM

## 2018-08-18 DIAGNOSIS — D631 Anemia in chronic kidney disease: Secondary | ICD-10-CM

## 2018-08-18 DIAGNOSIS — E785 Hyperlipidemia, unspecified: Secondary | ICD-10-CM

## 2018-08-18 DIAGNOSIS — E860 Dehydration: Secondary | ICD-10-CM

## 2018-08-18 LAB — CMP (CANCER CENTER ONLY)
ALT: 11 U/L (ref 0–44)
AST: 19 U/L (ref 15–41)
Albumin: 3.6 g/dL (ref 3.5–5.0)
Alkaline Phosphatase: 98 U/L (ref 38–126)
Anion gap: 7 (ref 5–15)
BUN: 16 mg/dL (ref 8–23)
CO2: 32 mmol/L (ref 22–32)
Calcium: 7.8 mg/dL — ABNORMAL LOW (ref 8.9–10.3)
Chloride: 101 mmol/L (ref 98–111)
Creatinine: 0.82 mg/dL (ref 0.44–1.00)
GFR, Est AFR Am: >60 mL/min (ref >60)
GFR, Estimated: >60 mL/min (ref >60)
Glucose, Bld: 108 mg/dL — ABNORMAL HIGH (ref 70–99)
Potassium: 4.4 mmol/L (ref 3.5–5.1)
Sodium: 140 mmol/L (ref 135–145)
Total Bilirubin: 0.3 mg/dL (ref 0.3–1.2)
Total Protein: 6.7 g/dL (ref 6.5–8.1)

## 2018-08-18 LAB — CBC WITH DIFFERENTIAL (CANCER CENTER ONLY)
Abs Immature Granulocytes: 0.03 10*3/uL (ref 0.00–0.07)
Basophils Absolute: 0.1 10*3/uL (ref 0.0–0.1)
Basophils Relative: 1 %
Eosinophils Absolute: 0.1 10*3/uL (ref 0.0–0.5)
Eosinophils Relative: 2 %
HCT: 39 % (ref 36.0–46.0)
Hemoglobin: 11.9 g/dL — ABNORMAL LOW (ref 12.0–15.0)
Immature Granulocytes: 0 %
Lymphocytes Relative: 7 %
Lymphs Abs: 0.5 10*3/uL — ABNORMAL LOW (ref 0.7–4.0)
MCH: 29.1 pg (ref 26.0–34.0)
MCHC: 30.5 g/dL (ref 30.0–36.0)
MCV: 95.4 fL (ref 80.0–100.0)
Monocytes Absolute: 0.7 10*3/uL (ref 0.1–1.0)
Monocytes Relative: 10 %
Neutro Abs: 5.5 10*3/uL (ref 1.7–7.7)
Neutrophils Relative %: 80 %
Platelet Count: 264 10*3/uL (ref 150–400)
RBC: 4.09 MIL/uL (ref 3.87–5.11)
RDW: 13.1 % (ref 11.5–15.5)
WBC Count: 6.8 10*3/uL (ref 4.0–10.5)
nRBC: 0 % (ref 0.0–0.2)

## 2018-08-18 LAB — COMPREHENSIVE METABOLIC PANEL
ALT: 10 U/L (ref 0–35)
AST: 20 U/L (ref 0–37)
Albumin: 3.5 g/dL (ref 3.5–5.2)
Alkaline Phosphatase: 113 U/L (ref 39–117)
BUN: 16 mg/dL (ref 6–23)
CO2: 32 mEq/L (ref 19–32)
Calcium: 8.1 mg/dL — ABNORMAL LOW (ref 8.4–10.5)
Chloride: 99 mEq/L (ref 96–112)
Creatinine, Ser: 0.79 mg/dL (ref 0.40–1.20)
GFR: 71.19 mL/min (ref >60.00)
Glucose, Bld: 90 mg/dL (ref 70–99)
Potassium: 4.3 mEq/L (ref 3.5–5.1)
Sodium: 139 mEq/L (ref 135–145)
Total Bilirubin: 0.3 mg/dL (ref 0.2–1.2)
Total Protein: 6.3 g/dL (ref 6.0–8.3)

## 2018-08-18 LAB — RETICULOCYTES
Immature Retic Fract: 3.3 % (ref 2.3–15.9)
RBC.: 4.1 MIL/uL (ref 3.87–5.11)
Retic Count, Absolute: 29.9 10*3/uL (ref 19.0–186.0)
Retic Ct Pct: 0.7 % (ref 0.4–3.1)

## 2018-08-18 LAB — LIPID PANEL
Cholesterol: 112 mg/dL (ref 0–200)
HDL: 36.8 mg/dL — ABNORMAL LOW (ref >39.00)
LDL Cholesterol: 53 mg/dL (ref 0–99)
NonHDL: 74.77
Total CHOL/HDL Ratio: 3
Triglycerides: 107 mg/dL (ref 0.0–149.0)
VLDL: 21.4 mg/dL (ref 0.0–40.0)

## 2018-08-18 MED ORDER — SODIUM CHLORIDE 0.9% FLUSH
10.0000 mL | Freq: Once | INTRAVENOUS | Status: DC
  Filled 2018-08-18: qty 10

## 2018-08-18 MED ORDER — HEPARIN SOD (PORK) LOCK FLUSH 100 UNIT/ML IV SOLN
500.0000 [IU] | Freq: Once | INTRAVENOUS | Status: DC
  Filled 2018-08-18: qty 5

## 2018-08-18 NOTE — Progress Notes (Signed)
Hematology and Oncology Follow Up Visit  [name] [id number] 15-Jul-1945 73 y.o. 08/18/2018   Principle Diagnosis:  Iron deficiency anemia Erythropoietin deficient anemia   Current Therapy:   IV iron as indicated Aranesp 300 mcg sq q 3 wks for hgb < 11   Interim History:  [name] is here today for follow-up.  We finally have hit upon the "trick" for her anemia.  We started her on Aranesp we last saw her.  She said that she began to feel a lot better a week later.  She had more stamina.  She felt less short of breath.  Today, her hemoglobin is 11.9.  This is the highest that it has ever been.  I believe this is clearly from the Aranesp.  We will have to see what her iron studies are.  She has had no fever.  She has had no bleeding.  There is been no problems with her bowels or bladder.  Overall, her performance status is ECOG 2.  Medications:  Allergies as of 08/18/2018      Reactions   Dabigatran Etexilate Mesylate Other (See Comments)   INTERNAL BLEEDING-pradaxa   Talwin [pentazocine] Other (See Comments)   hallucinations   Lactate Rash      Medication List       Accurate as of August 18, 2018  2:34 PM. If you have any questions, ask your nurse or doctor.        acetaminophen 325 MG tablet Commonly known as:  TYLENOL Take 2 tablets (650 mg total) by mouth every 4 (four) hours as needed.   albuterol 108 (90 Base) MCG/ACT inhaler Commonly known as:  ProAir HFA Inhale 2 puffs into the lungs every 6 (six) hours as needed for wheezing or shortness of breath.   albuterol 108 (90 Base) MCG/ACT inhaler Commonly known as:  VENTOLIN HFA Inhale 2 puffs into the lungs every 6 (six) hours as needed (for cough, wheeze, chest congestion or shortness of breath).   amLODipine 2.5 MG tablet Commonly known as:  NORVASC Take 1 tablet (2.5 mg total) by mouth daily.   apixaban 5 MG Tabs tablet Commonly known as:  Eliquis Take 1 tablet (5 mg total) by mouth 2 (two) times  daily.   atorvastatin 20 MG tablet Commonly known as:  LIPITOR Take 1 tablet (20 mg total) by mouth daily at 6 PM.   carvedilol 25 MG tablet Commonly known as:  COREG TAKE 1 TABLET BY MOUTH TWICE DAILY   denosumab 60 MG/ML Sosy injection Commonly known as:  PROLIA Inject 60 mg into the skin every 6 (six) months.   furosemide 40 MG tablet Commonly known as:  LASIX TAKE ONE TABLET (40mg ) BY MOUTH IN THE MORNING   gabapentin 600 MG tablet Commonly known as:  NEURONTIN Take 1 tablet by mouth 4 times daily   Incruse Ellipta 62.5 MCG/INH Aepb Generic drug:  umeclidinium bromide Inhale 1 puff into the lungs daily.   umeclidinium bromide 62.5 MCG/INH Aepb Commonly known as:  Incruse Ellipta Inhale 1 puff into the lungs daily.   lidocaine-prilocaine cream Commonly known as:  EMLA Apply 1 application topically as needed.   nitrofurantoin 100 MG capsule Commonly known as:  MACRODANTIN Take 100 mg by mouth daily.   nitroGLYCERIN 0.4 MG SL tablet Commonly known as:  NITROSTAT Place 1 tablet (0.4 mg total) under the tongue every 5 (five) minutes as needed. CHEST PAIN   oxyCODONE-acetaminophen 10-325 MG tablet Commonly known as:  PERCOCET Take  1 tablet by mouth every 4 (four) hours as needed. PAIN   pantoprazole 40 MG tablet Commonly known as:  PROTONIX Take 1 tablet by mouth twice daily   promethazine 25 MG tablet Commonly known as:  PHENERGAN TAKE ONE TABLET BY MOUTH EVERY 8 HOURS AS NEEDED FOR NAUSEA   tiZANidine 4 MG tablet Commonly known as:  ZANAFLEX Take 1 tablet (4 mg total) by mouth 3 (three) times daily as needed. for muscle spams   triamcinolone ointment 0.1 % Commonly known as:  KENALOG APPLY TO SKIN TWICE DAILY FOR 14 DAYS   zolpidem 5 MG tablet Commonly known as:  AMBIEN 1 po qhs prn       Allergies:  Allergies  Allergen Reactions  . Dabigatran Etexilate Mesylate Other (See Comments)    INTERNAL BLEEDING-pradaxa  . Talwin [Pentazocine] Other  (See Comments)    hallucinations  . Lactate Rash    Past Medical History, Surgical history, Social history, and Family History were reviewed and updated.  Review of Systems: Review of Systems  Constitutional: Negative.   HENT: Negative.   Eyes: Negative.   Respiratory: Positive for shortness of breath.   Gastrointestinal: Negative.   Genitourinary: Negative.   Musculoskeletal: Negative.   Skin: Negative.   Neurological: Negative.   Endo/Heme/Allergies: Negative.   Psychiatric/Behavioral: Negative.       Physical Exam:  weight is 86 lb (39 kg). Her oral temperature is 98.3 F (36.8 C). Her blood pressure is 116/60 and her pulse is 67. Her respiration is 19 and oxygen saturation is 100%.   Wt Readings from Last 3 Encounters:  08/18/18 86 lb (39 kg)  08/04/18 88 lb (39.9 kg)  07/24/18 86 lb (39 kg)    Physical Exam Vitals signs reviewed.  HENT:     Head: Normocephalic and atraumatic.  Eyes:     Pupils: Pupils are equal, round, and reactive to light.  Neck:     Musculoskeletal: Normal range of motion.  Cardiovascular:     Rate and Rhythm: Normal rate and regular rhythm.     Heart sounds: Normal heart sounds.  Pulmonary:     Effort: Pulmonary effort is normal.     Breath sounds: Normal breath sounds.  Abdominal:     General: Bowel sounds are normal.     Palpations: Abdomen is soft.  Musculoskeletal: Normal range of motion.        General: No tenderness or deformity.  Lymphadenopathy:     Cervical: No cervical adenopathy.  Skin:    General: Skin is warm and dry.     Findings: No erythema or rash.  Neurological:     Mental Status: She is alert and oriented to person, place, and time.  Psychiatric:        Behavior: Behavior normal.        Thought Content: Thought content normal.        Judgment: Judgment normal.      Lab Results  Component Value Date   WBC 6.8 08/18/2018   HGB 11.9 (L) 08/18/2018   HCT 39.0 08/18/2018   MCV 95.4 08/18/2018   PLT 264  08/18/2018   Lab Results  Component Value Date   FERRITIN 1,053 (H) 07/24/2018   IRON 48 07/24/2018   TIBC 204 (L) 07/24/2018   UIBC 156 07/24/2018   IRONPCTSAT 23 07/24/2018   Lab Results  Component Value Date   RETICCTPCT 0.7 08/18/2018   RBC 4.09 08/18/2018   RBC 4.10 08/18/2018   RETICCTABS 64.6  04/01/2009   No results found for: KPAFRELGTCHN, LAMBDASER, KAPLAMBRATIO No results found for: IGGSERUM, IGA, IGMSERUM No results found for: Odetta Pink, SPEI   Chemistry      Component Value Date/Time   NA 140 07/24/2018 1345   K 4.1 07/24/2018 1345   CL 102 07/24/2018 1345   CO2 33 (H) 07/24/2018 1345   BUN 13 07/24/2018 1345   CREATININE 0.77 07/24/2018 1345   CREATININE 0.91 03/16/2016 1539      Component Value Date/Time   CALCIUM 8.3 (L) 07/24/2018 1345   ALKPHOS 101 07/24/2018 1345   AST 16 07/24/2018 1345   ALT 9 07/24/2018 1345   BILITOT 0.3 07/24/2018 1345       Impression and Plan: [name] is a very pleasant 73 yo caucasian female with iron deficiency anemia secondary to malabsorption and possible GI blood loss with Eliquis.   We will definitely need to utilize Aranesp in the future.  Again we will see what her iron studies show.  I am just glad that her quality of life has improved.  We will see her back in another 6 weeks.  Volanda Napoleon, MD 6/1/20202:34 PM

## 2018-08-19 ENCOUNTER — Encounter: Payer: Self-pay | Admitting: *Deleted

## 2018-08-19 LAB — FERRITIN: Ferritin: 795 ng/mL — ABNORMAL HIGH (ref 11–307)

## 2018-08-19 LAB — IRON AND TIBC
Iron: 74 ug/dL (ref 41–142)
Saturation Ratios: 38 % (ref 21–57)
TIBC: 198 ug/dL — ABNORMAL LOW (ref 236–444)
UIBC: 123 ug/dL (ref 120–384)

## 2018-08-20 ENCOUNTER — Encounter: Payer: Self-pay | Admitting: Cardiology

## 2018-08-20 NOTE — Progress Notes (Signed)
Remote pacemaker transmission.   

## 2018-08-22 ENCOUNTER — Telehealth: Payer: Self-pay | Admitting: Pulmonary Disease

## 2018-08-22 MED ORDER — UMECLIDINIUM BROMIDE 62.5 MCG/INH IN AEPB: 1.0000 | INHALATION_SPRAY | Freq: Every day | RESPIRATORY_TRACT | 0 refills | Status: DC

## 2018-08-22 NOTE — Telephone Encounter (Signed)
Pt came to office to learn incruse  1 sample provided for her and I showed her how to use  Nothing further needed

## 2018-08-25 NOTE — Telephone Encounter (Signed)
08/25/2018 1645  Received information in the mail from patient's insurance saying that Incruse is not on formulary.  I have contacted Sharee Pimple [place] with [facility] to confirm that this is not anything that we need to worry about as we are ordering Incruse for the patient simply because she would qualify for the [place] for you patient assistance program.  Routing to Lauren for follow up.   Wyn Quaker, FNP

## 2018-08-26 ENCOUNTER — Ambulatory Visit: Payer: Self-pay | Admitting: Pharmacist

## 2018-08-27 ENCOUNTER — Other Ambulatory Visit: Payer: Self-pay | Admitting: Pharmacy Technician

## 2018-08-27 ENCOUNTER — Telehealth: Payer: Self-pay | Admitting: Pulmonary Disease

## 2018-08-27 NOTE — Patient Outreach (Signed)
[facility]) Care Management  08/27/2018  [name] Aug 01, 1945 [id number]    Received voicemail message from NP Wyn Quaker at [facility] concerning patient's Incruse.  The message stated he had received a letter form her insurance company stating the Icruse was not covered/non formulary. He inquired if the letter could be disregarded at this time as we are trying to obtain patient assistance for this medication.  Returned call and spoke to [place] and informed her the letter could be disregarded for now. However, if for some reason the patient was not approved for the program then the provider would have to change to a covered medication. Jonelle Sidle informed she would relay the message to Wyn Quaker, NP.  Luiz Ochoa. Tiesha Marich, [place] Management [phone]

## 2018-08-27 NOTE — Telephone Encounter (Signed)
Spoke with Sharee Pimple at [facility] and she wants Aaron Edelman to disregard the letter he received from her insurance about the denial from the insurance about her Incruse. She is in the process of getting financial assistance for the medication but they must wait for the company to make a decision. If in the event that they don't provide her with financial assistance, then Aaron Edelman would have to change her medication and she would let us know. Martie Lee

## 2018-08-28 NOTE — Telephone Encounter (Signed)
Received call from [place] team sent to disregard formulary notice from patient's insurance.  See telephone note on 08/27/2018 for more information.  Wyn Quaker, FNP

## 2018-08-29 ENCOUNTER — Other Ambulatory Visit: Payer: Self-pay | Admitting: Pharmacy Technician

## 2018-08-29 NOTE — Patient Outreach (Signed)
[name]) Care Management  08/29/2018  [name] 04/21/1945 [id number]    Care coordination call palced to [employer] in regards to Proventil application.  Spoke to [place] who informed application was received and attestation letter was mailed on 08/20/2018.  Will followup with patient.  Emmitt Matthews P. Ezrie Bunyan, [place] Management [phone]

## 2018-08-29 NOTE — Patient Outreach (Signed)
[facility]) Care Management  08/29/2018  PETULA ROTOLO 1945/04/14 [id number]   ADDENDUM  Successful outreach call placed to patient in regards to [employer] application for Eliquis, [employer] application for Proventil, [employer] application for Incruse.  Spoke to patient, HIPAA identifiers verified.  Patient informed she had received the [employer] attestation form and had mailed it back.   Patient informed she had received the [place] application and had mailed it back this week.  Informed patient for her dependents the pharmacy printout needs to be from 2020. Patient informed she would have that sent to me. Patient and patient's spouse OOP can be obtained from [facility].  Will followup with patient in 10-14 business days if information has not been received.  Shinita Mac P. Markelle Asaro, [place] Management [phone]

## 2018-09-01 ENCOUNTER — Ambulatory Visit: Payer: Self-pay | Admitting: Pharmacist

## 2018-09-01 NOTE — Telephone Encounter (Signed)
[name] from [place] is calling about PA for Incruse.  [phone]

## 2018-09-01 NOTE — Telephone Encounter (Signed)
Spoke with [name]. She was asking about the status of the PA request for Incruse. Explained to her that we are in the process of getting the patient approved for patient assistance for Incruse.   Nothing further needed from [place].

## 2018-09-02 ENCOUNTER — Other Ambulatory Visit: Payer: Self-pay | Admitting: Family Medicine

## 2018-09-02 DIAGNOSIS — R52 Pain, unspecified: Secondary | ICD-10-CM

## 2018-09-02 MED ORDER — OXYCODONE-ACETAMINOPHEN 10-325 MG PO TABS: 1.0000 | ORAL_TABLET | ORAL | 0 refills | Status: DC | PRN

## 2018-09-02 NOTE — Telephone Encounter (Signed)
Oxycodone refill.   Last OV: 08/04/2018 Last Fill: 08/04/2018 #120 and 0RF Pt sig:1 tab q4h prn UDS: 03/07/2017 Low risk

## 2018-09-02 NOTE — Telephone Encounter (Signed)
Medication Refill - Medication: oxyCODONE-acetaminophen (PERCOCET) 10-325 MG tablet   Has the patient contacted their pharmacy? Yes.   (Agent: If no, request that the patient contact the pharmacy for the refill.) (Agent: If yes, when and what did the pharmacy advise?)  Preferred Pharmacy (with phone number or street name): [facility] [place], [place]  Agent: Please be advised that RX refills may take up to 3 business days. We ask that you follow-up with your pharmacy.

## 2018-09-05 ENCOUNTER — Other Ambulatory Visit: Payer: Self-pay

## 2018-09-05 ENCOUNTER — Telehealth (INDEPENDENT_AMBULATORY_CARE_PROVIDER_SITE_OTHER): Payer: HMO | Admitting: Cardiology

## 2018-09-05 ENCOUNTER — Encounter: Payer: Self-pay | Admitting: Cardiology

## 2018-09-05 VITALS — Ht 60.0 in | Wt 86.0 lb

## 2018-09-05 DIAGNOSIS — Z7189 Other specified counseling: Secondary | ICD-10-CM

## 2018-09-05 DIAGNOSIS — I10 Essential (primary) hypertension: Secondary | ICD-10-CM

## 2018-09-05 DIAGNOSIS — I480 Paroxysmal atrial fibrillation: Secondary | ICD-10-CM | POA: Diagnosis not present

## 2018-09-05 DIAGNOSIS — I110 Hypertensive heart disease with heart failure: Secondary | ICD-10-CM | POA: Diagnosis not present

## 2018-09-05 DIAGNOSIS — I5032 Chronic diastolic (congestive) heart failure: Secondary | ICD-10-CM

## 2018-09-05 MED ORDER — AMLODIPINE BESYLATE 2.5 MG PO TABS: 2.5000 mg | ORAL_TABLET | Freq: Every day | ORAL | 3 refills | Status: DC

## 2018-09-05 NOTE — Patient Instructions (Signed)
Medication Instructions:  Your physician recommends that you continue on your current medications as directed. Please refer to the Current Medication list given to you today.  If you need a refill on your cardiac medications before your next appointment, please call your pharmacy.   Lab work: NONE  Testing/Procedures: NONE  Follow-Up: At [facility], you and your health needs are our priority.  As part of our continuing mission to provide you with exceptional heart care, we have created designated Provider Care Teams.  These Care Teams include your primary Cardiologist (physician) and Advanced Practice Providers (APPs -  Physician Assistants and Nurse Practitioners) who all work together to provide you with the care you need, when you need it. You will need a follow up appointment in 6 months. IN THE OFFICE  Please call our office 2 months in advance to schedule this appointment.  You may see DR [facility]  or one of the following Advanced Practice Providers on your designated Care Team:   Rosaria Ferries, PA-C . Jory [place], DNP, ANP

## 2018-09-05 NOTE — Progress Notes (Signed)
Virtual Visit via Telephone Note   This visit type was conducted due to national recommendations for restrictions regarding the COVID-19 Pandemic (e.g. social distancing) in an effort to limit this patient's exposure and mitigate transmission in our community.  Due to her co-morbid illnesses, this patient is at least at moderate risk for complications without adequate follow up.  This format is felt to be most appropriate for this patient at this time.  The patient did not have access to video technology/had technical difficulties with video requiring transitioning to audio format only (telephone).  All issues noted in this document were discussed and addressed.  No physical exam could be performed with this format.  Please refer to the patient's chart for her  consent to telehealth for [facility].   Date:  09/07/2018   ID:  [name], [name] 12/08/1945, MRN [id number]  Patient Location: Home Provider Location: Home  PCP:  Carollee Herter, Alferd Apa, DO  Cardiologist:  Minus Breeding, MD  Electrophysiologist:  None   Evaluation Performed:  Follow-Up Visit  Chief Complaint:  Dyspnea  History of Present Illness:    [name] is a 73 y.o. female who presents for followup of a nonischemic cardiomyopathy and atrial fibrillation. She was in the hospital twice once in March of 2109 with a TIA.  She had previous atrial fibrillation and we had avoided anticoagulation because she had no recurrence and she had risk for anticoagulants but she was noted to be apparently atrial fibrillation at that time and so the decision was  made for her to start anticoagulant.  She came back in the hospital in April of last year with acute cholecystitis and eventually had to have cholecystectomy.  She had problems with labile blood pressure.  Echocardiogram done in March demonstrated a well-preserved ejection fraction.  She had swelling in her legs.  I sent her for an ultrasound and she had evidence of a Baker's  cyst.    She did have some increased SOB but a BNP was only mildly increased last year.  She has been breathing better since I last saw her. The patient denies any new symptoms such as chest discomfort, neck or arm discomfort. There has been no new shortness of breath, PND or orthopnea. There have been no reported palpitations, presyncope or syncope.   She has had improvement in the leg swelling that she was having.   The patient does not have symptoms concerning for COVID-19 infection (fever, chills, cough, or new shortness of breath).    Past Medical History:  Diagnosis Date   Anemia    Arthritis    right hip   Atrial fibrillation ([place])    CAD (coronary artery disease)    mild disease per cath in 2011   CHF (congestive heart failure) (HCC)    COPD (chronic obstructive pulmonary disease) (HCC)    continuous O2- 2l   Diverticulitis    Elbow fracture, left aug 2012   Erythropoietin deficiency anemia 06/19/2018   Fibromyalgia    COUPLE OF TIMES A YEAR-PT HAS EPISODES OF CONFUSION-USUALLY INVOLVES DAY/NIGHT REVERSAL AND EPISODES OF TWITCHING AND FALLS--SEES DR. Jacelyn Grip - NEUROLOGIST-LAST SEEN 07/10/11   Gastroparesis    GERD (gastroesophageal reflux disease)    H/O: GI bleed    from Pradaxa   Hepatitis    hx of in high school    HTN (hypertension)    under control; has been on med. x "years"   Hx MRSA infection    Hx  of gastric ulcer    Hx of stress fx aug 2011   right hip    Iron malabsorption 08/14/2017   LBBB (left bundle branch block)    Oxygen dependent    2 liters via nasal cannula at all times   Pacemaker    CRT therapy; followed by Dr. Caryl Comes   Pleural effusion     s/p right thoracentesis 03/09   Pneumonia - 03/09, 12/09, 02/10, 06/10   MOST RECENT FEB 2013   Primary dilated cardiomyopathy ([place])    EF 45 to 50% per echo in Jan 2012   RLS (restless legs syndrome)    Silent aspiration    Small bowel obstruction (HCC)    Urinary incontinence       -INTERSTIM IMPLANT NOT FUNCTIONING PER PT   Venous embolism and thrombosis of subclavian vein ([place])    after pacemaker insertion Oct 2011   Vitamin B12 deficiency    Past Surgical History:  Procedure Laterality Date   APPENDECTOMY     CARDIAC CATHETERIZATION  04/08/2006, 11/16/2009   CHOLECYSTECTOMY N/A 06/27/2017   Procedure: OPEN CHOLECYSTECTOMY;  Surgeon: Ralene Ok, MD;  Location: [place];  Service: General;  Laterality: N/A;   COLECTOMY     Intestinal resection (5times)   COLECTOMY  02/04/2000   ex. lap., intra-abd. subtotal colectomy with ileosigmoid colon anastomosies and lysis of adhesions   CYSTOSCOPY  11/11/2008   CYSTOSCOPY W/ RETROGRADES  11/11/2008   right   CYSTOSCOPY WITH INJECTION  04/30/2006   transurethral collagen injection; incision vaginal stenosis   ERCP N/A 06/23/2017   Procedure: ENDOSCOPIC RETROGRADE CHOLANGIOPANCREATOGRAPHY (ERCP);  Surgeon: Carol Ada, MD;  Location: [place];  Service: Endoscopy;  Laterality: N/A;   ERCP N/A 06/24/2017   Procedure: ENDOSCOPIC RETROGRADE CHOLANGIOPANCREATOGRAPHY (ERCP);  Surgeon: [name], MD;  Location: [facility] END[facility]COPY;  Service: Endoscopy;  Laterality: N/A;   ESOPHAGOGASTRODUODENOSCOPY N/A 02/02/2013   Procedure: ESOPHAGOGASTRODUODENOSCOPY (EGD);  Surgeon: Lafayette Dragon, MD;  Location: [facility];  Service: Endoscopy;  Laterality: N/A;   EXPLORATORY LAPAROTOMY  04/27/2009   lysis of adhesions, gastrostomy tube   GASTROCUTANEOUS FISTULA CLOSURE     INTERSTIM IMPLANT PLACEMENT  05/28/2006 - stage I   06/05/2006 - stage II   INTERSTIM IMPLANT PLACEMENT  02/05/2012   Procedure: [name] IMPLANT FIRST STAGE;  Surgeon: Reece Packer, MD;  Location: [facility];  Service: Urology;  Laterality: N/A;  Replacement of Interstim Lead      INTERSTIM IMPLANT REVISION  03/06/2011   Procedure: REVISION OF [name];  Surgeon: Reece Packer, MD;  Location: [facility];  Service: Urology;  Laterality: N/A;   Replacement of Neurostimulator   INTERSTIM IMPLANT REVISION  10/23/2007   IR IMAGING GUIDED PORT INSERTION  10/14/2017   PACEMAKER PLACEMENT  12/21/2009   Peg removed     with complications   PORT-A-CATH REMOVAL  07/27/2011   Procedure: REMOVAL PORT-A-CATH;  Surgeon: Rolm Bookbinder, MD;  Location: [place];  Service: General;  Laterality: N/A;   PORTACATH PLACEMENT  12/16/2009   SAVORY DILATION N/A 02/02/2013   Procedure: SAVORY DILATION;  Surgeon: Lafayette Dragon, MD;  Location: [facility];  Service: Endoscopy;  Laterality: N/A;   SMALL INTESTINE SURGERY  05/20/2001   ex. lap., resection of small bowel stricture; gastrostomy; insertion central line   TONSILLECTOMY  age 29   TOTAL ABDOMINAL HYSTERECTOMY     complete   TOTAL HIP ARTHROPLASTY  08/03/2011   Procedure: TOTAL HIP ARTHROPLASTY ANTERIOR APPROACH;  Surgeon:  Mcarthur Rossetti, MD;  Location: [facility];  Service: Orthopedics;  Laterality: Right;     Prior to Admission medications   Medication Sig Start Date End Date Taking? Authorizing Provider  acetaminophen (TYLENOL) 325 MG tablet Take 2 tablets (650 mg total) by mouth every 4 (four) hours as needed. 07/01/17  Yes Saverio Danker, PA-C  albuterol [facility] HFA) 108 (90 Base) MCG/ACT inhaler Inhale 2 puffs into the lungs every 6 (six) hours as needed for wheezing or shortness of breath. 06/26/18  Yes Chesley Mires, MD  amLODipine (NORVASC) 2.5 MG tablet Take 1 tablet (2.5 mg total) by mouth daily. 09/09/17  Yes Minus Breeding, MD  apixaban (ELIQUIS) 5 MG TABS tablet Take 1 tablet (5 mg total) by mouth 2 (two) times daily. 08/04/18  Yes Roma Schanz R, DO  atorvastatin (LIPITOR) 20 MG tablet Take 1 tablet (20 mg total) by mouth daily at 6 PM. 08/04/18  Yes Lowne Chase, Yvonne R, DO  carvedilol (COREG) 25 MG tablet TAKE 1 TABLET BY MOUTH TWICE DAILY 04/01/18  Yes Minus Breeding, MD  denosumab (PROLIA) 60 MG/ML SOSY injection Inject 60 mg into the skin every 6  (six) months. 03/25/15  Yes [provider]  furosemide (LASIX) 40 MG tablet TAKE ONE TABLET (40mg ) BY MOUTH IN THE MORNING 07/30/17  Yes Minus Breeding, MD  gabapentin (NEURONTIN) 600 MG tablet Take 1 tablet by mouth 4 times daily 08/04/18  Yes Lowne Lyndal Pulley R, DO  lidocaine-prilocaine (EMLA) cream Apply 1 application topically as needed. 10/16/17  Yes Ennever, Rudell Cobb, MD  nitrofurantoin (MACRODANTIN) 100 MG capsule Take 100 mg by mouth daily. 05/12/18  Yes [provider]  nitroGLYCERIN (NITROSTAT) 0.4 MG SL tablet Place 1 tablet (0.4 mg total) under the tongue every 5 (five) minutes as needed. CHEST PAIN 05/15/17  Yes Deboraha Sprang, MD  oxyCODONE-acetaminophen (PERCOCET) 10-325 MG tablet Take 1 tablet by mouth every 4 (four) hours as needed. PAIN 09/02/18  Yes Roma Schanz R, DO  pantoprazole (PROTONIX) 40 MG tablet Take 40 mg by mouth daily.   Yes [provider]  promethazine (PHENERGAN) 25 MG tablet TAKE ONE TABLET BY MOUTH EVERY 8 HOURS AS NEEDED FOR NAUSEA 09/18/17  Yes Gatha Mayer, MD  tiotropium (SPIRIVA) 18 MCG inhalation capsule Place 18 mcg into inhaler and inhale daily.   Yes [provider]  tiZANidine (ZANAFLEX) 4 MG tablet Take 1 tablet (4 mg total) by mouth 3 (three) times daily as needed. for muscle spams 08/04/18  Yes Roma Schanz R, DO  triamcinolone ointment (KENALOG) 0.1 % APPLY TO SKIN TWICE DAILY FOR 14 DAYS 03/19/18  Yes [provider]  zolpidem (AMBIEN) 5 MG tablet 1 po qhs prn 08/04/18  Yes Lowne Chase, Yvonne R, DO  umeclidinium bromide (INCRUSE ELLIPTA) 62.5 MCG/INH AEPB Inhale 1 puff into the lungs daily. 08/15/18   Lauraine Rinne, NP  umeclidinium bromide (INCRUSE ELLIPTA) 62.5 MCG/INH AEPB Inhale 1 puff into the lungs daily. 08/22/18   Lauraine Rinne, NP  iron dextran complex (INFED) 50 MG/ML injection Please give infed infusion (no test dose needed) over 4 hours per pharmacy calculated dose. Ht: 5'2 Wt: 99  pounds Hgb: 12 12/20/10 06/05/11  Lafayette Dragon, MD     Allergies:   Dabigatran etexilate mesylate, Talwin [pentazocine], and Lactate   Social History   Tobacco Use   Smoking status: Former Smoker    Packs/day: 1.00    Years: 20.00  Pack years: 20.00    Quit date: 03/19/1986    Years since quitting: 32.4   Smokeless tobacco: Never Used  Substance Use Topics   Alcohol use: No    Alcohol/week: 0.0 standard drinks   Drug use: No     Family Hx: The patient's family history includes Breast cancer in her maternal aunt; Diabetes in her paternal grandfather; Heart disease in her brother, father, mother, and paternal aunt; Stroke in her mother. There is no history of Colon cancer.  ROS:   Please see the history of present illness.    As stated in the HPI and negative for all other systems.   Prior CV studies:   The following studies were reviewed today:    Labs/Other Tests and Data Reviewed:    EKG:  No ECG reviewed.  Recent Labs: 03/06/2018: B Natriuretic Peptide 180.8 08/18/2018: ALT 11; BUN 16; Creatinine 0.82; Hemoglobin 11.9; Platelet Count 264; Potassium 4.4; Sodium 140   Recent Lipid Panel Lab Results  Component Value Date/Time   CHOL 112 08/18/2018 01:08 PM   TRIG 107.0 08/18/2018 01:08 PM   TRIG 76 03/11/2006 10:59 AM   HDL 36.80 (L) 08/18/2018 01:08 PM   CHOLHDL 3 08/18/2018 01:08 PM   LDLCALC 53 08/18/2018 01:08 PM    Wt Readings from Last 3 Encounters:  09/05/18 86 lb (39 kg)  08/18/18 86 lb (39 kg)  08/04/18 88 lb (39.9 kg)     Objective:    Vital Signs:  Ht 5' (1.524 m)    Wt 86 lb (39 kg)    BMI 16.80 kg/m     ASSESSMENT & PLAN:     ATRIAL FIBRILLATION -  [name] has a CHA2DS2 - VASc score of 4 with a risk of stroke of 4%.  She tolerates anticoagulation.  No change in therapy.    CARDIOMYOPATHY, PRIMARY, DILATED -  Her EF is now reported to be 55%.   She had only a mild increase in BNP.  She feels better as her anemia has  improved.  She is improved overall with her breathing and I plan no change in therapy.   HYPERTENSION -  The blood pressure is at target. No change in medications is indicated. We will continue with therapeutic lifestyle changes (TLC).  ELEVATED CREAT - Creatinine is now back to baseline of 0.82.     COVID-19 Education: The signs and symptoms of COVID-19 were discussed with the patient and how to seek care for testing (follow up with PCP or arrange E-visit).  The importance of social distancing was discussed today.  Time:   Today, I have spent 16 minutes with the patient with telehealth technology discussing the above problems.     Medication Adjustments/Labs and Tests Ordered: Current medicines are reviewed at length with the patient today.  Concerns regarding medicines are outlined above.   Tests Ordered: No orders of the defined types were placed in this encounter.   Medication Changes: Meds ordered this encounter  Medications   amLODipine (NORVASC) 2.5 MG tablet    Sig: Take 1 tablet (2.5 mg total) by mouth daily.    Dispense:  90 tablet    Refill:  3    Follow Up:  In Person in six months  Signed, Minus Breeding, MD  09/07/2018 8:45 AM    [place]

## 2018-09-07 ENCOUNTER — Encounter: Payer: Self-pay | Admitting: Cardiology

## 2018-09-07 DIAGNOSIS — Z7189 Other specified counseling: Secondary | ICD-10-CM | POA: Insufficient documentation

## 2018-09-09 MED ORDER — INCRUSE ELLIPTA 62.5 MCG/INH IN AEPB: 1.0000 | INHALATION_SPRAY | Freq: Every day | RESPIRATORY_TRACT | 12 refills | Status: DC

## 2018-09-09 NOTE — Telephone Encounter (Signed)
Merchant [employer] officer for [employer]. Aaron Edelman signed. FAxed to [facility] at [phone].  Nothing further needed

## 2018-09-09 NOTE — Addendum Note (Signed)
Addended by: Amado Coe on: 09/09/2018 02:40 PM   Modules accepted: Orders

## 2018-09-09 NOTE — Telephone Encounter (Signed)
09/09/2018 933  Lauren can you please see the information listed below from [facility] regarding the patient:  Good morning!  I received the patient's portion of the patient assistance application for [employer]. However, we are in need of prescriptions faxed to [place] to put with the application for Incruse and Ventolin HFA for submission to [place]. Our fax number at [facility] is [phone].  Thanks for your time and consideration.  Jill P. Simcox, [facility] Management  [phone]   Can you please have printed prescriptions generated and faxed over to [facility]?  Wyn Quaker FNP

## 2018-09-10 MED ORDER — ALBUTEROL SULFATE HFA 108 (90 BASE) MCG/ACT IN AERS: 2.0000 | INHALATION_SPRAY | Freq: Four times a day (QID) | RESPIRATORY_TRACT | 12 refills | Status: DC | PRN

## 2018-09-10 NOTE — Addendum Note (Signed)
Addended by: Amado Coe on: 09/10/2018 09:33 AM   Modules accepted: Orders

## 2018-09-11 ENCOUNTER — Other Ambulatory Visit: Payer: Self-pay | Admitting: Pharmacy Technician

## 2018-09-11 ENCOUNTER — Other Ambulatory Visit: Payer: Self-pay | Admitting: Family Medicine

## 2018-09-11 DIAGNOSIS — J449 Chronic obstructive pulmonary disease, unspecified: Secondary | ICD-10-CM | POA: Diagnosis not present

## 2018-09-11 DIAGNOSIS — I4891 Unspecified atrial fibrillation: Secondary | ICD-10-CM | POA: Diagnosis not present

## 2018-09-11 DIAGNOSIS — G4700 Insomnia, unspecified: Secondary | ICD-10-CM

## 2018-09-11 NOTE — Patient Outreach (Signed)
[name]) Care Management  09/11/2018  [name] May 02, 1945 [id number]   Received all necessary documents and signatures for [name] patient assistance for Incruse.  Submitted completed [place] application via fax.  Will followup with [place] in 2-5 business days to inquire of status of application.   Received updated OOP information for [employer] patient assistance for Eliquis.  Submitted update information to [employer] via fax.  Will followup with [employer] in 2-5 business days to inquire on status of application.  [name] P. [name], [place] Management [phone]

## 2018-09-11 NOTE — Patient Outreach (Signed)
[facility]) Care Management  09/11/2018  [name] 1945/12/14 [id number]    ADDENDUM  Care coordination call placed to [employer] patient assistance in regards to patient's application for Proventil HFA.  Spoke to [facility] who informed patient had been APPROVED for the program 09/04/2018-03/19/2019. [name] informed medication was ordered 09/04/2018 and is expected to be delivered 09/12/2018. The tracking number for the order is [id number].  Will followup with patient in 2-5 business days to inquire if medication was received.  [name] P. Naziah Weckerly, [place] Management [phone]

## 2018-09-15 ENCOUNTER — Ambulatory Visit (INDEPENDENT_AMBULATORY_CARE_PROVIDER_SITE_OTHER): Payer: HMO | Admitting: *Deleted

## 2018-09-15 ENCOUNTER — Telehealth: Payer: HMO | Admitting: Cardiology

## 2018-09-15 DIAGNOSIS — I480 Paroxysmal atrial fibrillation: Secondary | ICD-10-CM

## 2018-09-15 DIAGNOSIS — I420 Dilated cardiomyopathy: Secondary | ICD-10-CM

## 2018-09-15 NOTE — Telephone Encounter (Signed)
Ambien refill.   Last OV: 08/04/2018 Last Fill: 08/04/2018 #30 and 0RF UDS: 03/07/2017

## 2018-09-15 NOTE — Telephone Encounter (Signed)
Pt called to f/up on this request.  Pt states she has one dose left for tonight.

## 2018-09-16 ENCOUNTER — Telehealth: Payer: Self-pay

## 2018-09-16 NOTE — Telephone Encounter (Signed)
Spoke with patient to remind of missed remote transmission 

## 2018-09-17 ENCOUNTER — Other Ambulatory Visit: Payer: Self-pay | Admitting: Pharmacy Technician

## 2018-09-17 ENCOUNTER — Ambulatory Visit
Admission: RE | Admit: 2018-09-17 | Discharge: 2018-09-17 | Disposition: A | Discharge status: Home / Self Care | Payer: HMO | Source: Ambulatory Visit | Attending: Orthopaedic Surgery | Admitting: Orthopaedic Surgery

## 2018-09-17 VITALS — BP 117/66 | HR 66

## 2018-09-17 DIAGNOSIS — G8929 Other chronic pain: Secondary | ICD-10-CM | POA: Diagnosis not present

## 2018-09-17 DIAGNOSIS — M48061 Spinal stenosis, lumbar region without neurogenic claudication: Secondary | ICD-10-CM | POA: Diagnosis not present

## 2018-09-17 DIAGNOSIS — M545 Low back pain, unspecified: Secondary | ICD-10-CM

## 2018-09-17 DIAGNOSIS — M51369 Other intervertebral disc degeneration, lumbar region without mention of lumbar back pain or lower extremity pain: Secondary | ICD-10-CM

## 2018-09-17 DIAGNOSIS — M4807 Spinal stenosis, lumbosacral region: Secondary | ICD-10-CM

## 2018-09-17 DIAGNOSIS — M5136 Other intervertebral disc degeneration, lumbar region: Secondary | ICD-10-CM

## 2018-09-17 LAB — CUP PACEART REMOTE DEVICE CHECK
Battery Remaining Longevity: 6 mo
Battery Voltage: 2.83 V
Brady Statistic AP VP Percent: 0.24 %
Brady Statistic AP VS Percent: 0.01 %
Brady Statistic AS VP Percent: 99.71 %
Brady Statistic AS VS Percent: 0.05 %
Brady Statistic RA Percent Paced: 0.24 %
Brady Statistic RV Percent Paced: 99.93 %
Date Time Interrogation Session: 20200630232537
Implantable Lead Implant Date: 20111005
Implantable Lead Implant Date: 20111005
Implantable Lead Implant Date: 20111005
Implantable Lead Location: 753858
Implantable Lead Location: 753859
Implantable Lead Location: 753860
Implantable Lead Model: 4196
Implantable Lead Model: 5076
Implantable Lead Model: 5076
Implantable Pulse Generator Implant Date: 20111005
Lead Channel Impedance Value: 304 Ohm
Lead Channel Impedance Value: 342 Ohm
Lead Channel Impedance Value: 380 Ohm
Lead Channel Impedance Value: 399 Ohm
Lead Channel Impedance Value: 418 Ohm
Lead Channel Impedance Value: 456 Ohm
Lead Channel Impedance Value: 475 Ohm
Lead Channel Impedance Value: 494 Ohm
Lead Channel Impedance Value: 646 Ohm
Lead Channel Pacing Threshold Amplitude: 0.5 V
Lead Channel Pacing Threshold Amplitude: 0.625 V
Lead Channel Pacing Threshold Pulse Width: 0.4 ms
Lead Channel Pacing Threshold Pulse Width: 0.4 ms
Lead Channel Sensing Intrinsic Amplitude: 0.75 mV
Lead Channel Sensing Intrinsic Amplitude: 0.75 mV
Lead Channel Sensing Intrinsic Amplitude: 6.25 mV
Lead Channel Sensing Intrinsic Amplitude: 8 mV
Lead Channel Setting Pacing Amplitude: 1.25 V
Lead Channel Setting Pacing Amplitude: 2 V
Lead Channel Setting Pacing Amplitude: 2.5 V
Lead Channel Setting Pacing Pulse Width: 0.4 ms
Lead Channel Setting Pacing Pulse Width: 0.8 ms
Lead Channel Setting Sensing Sensitivity: 0.9 mV

## 2018-09-17 MED ORDER — DIAZEPAM 5 MG PO TABS: 5.0000 mg | ORAL_TABLET | Freq: Once | ORAL | Status: DC

## 2018-09-17 MED ORDER — IOPAMIDOL (ISOVUE-M 200) INJECTION 41%
18.0000 mL | Freq: Once | INTRAMUSCULAR | Status: AC
  Administered 2018-09-17: 18 mL via INTRATHECAL

## 2018-09-17 NOTE — Progress Notes (Signed)
Patient put on our O2 tank at 3L/min via [place] in order to conserve her tank.

## 2018-09-17 NOTE — Progress Notes (Signed)
Patient states she has been off Eliquis for at least the past two days.

## 2018-09-17 NOTE — Patient Outreach (Signed)
[place]) Care Management  09/17/2018  [name] 1945/11/27 [id number]   Care coordination call placed to [place] in regards to Incruse application.  Spoke to [place] who informed patient APPROVED for the PAP 09/15/2018-03/19/2019. Patient will receive a 3 months supply of medication and medication was shipped today. Charla informed patient should receive the medication within 10 business days.  Care coordination call placed to [employer] in regards to Eliquis application. Spoke to [place] who informed patient APPROVED for the PAP 09/11/2018-03/19/2019. Atisha informed a 90 days supply of medication was delivered to the patient's home yesterday.  Will followup with patient.  Sherrine Salberg P. Paiden Cavell, [place] Management [phone]

## 2018-09-17 NOTE — Discharge Instructions (Addendum)

## 2018-09-17 NOTE — Patient Outreach (Signed)
[place] [facility]) Care Management  09/17/2018  [name] 1945/09/14 [id number]    Addendum  Successful outreach call placed to patient in regards to [employer] application for Eliquis, [employer] application for Proventil HFA and [employer] application for Incruse.  Spoke to patient, HIPAA identifiers verified.  Patient informed she received a 90 days supply of Eliquis from [employer] and 3 Proventil inhalers from [employer].  I informed patient that she was approved for Incruse with the [place] PAP. Informed patient that she would be receiving a 90 days supply in the next 10 business days.  Discussed with patient the refill proceudres of each company and patient verbazlied understanding. Confirmed patient had name and number for future.  Will followup with patient in 10-14 business days to confirm receipt of Incruse.  Shalissa Easterwood P. Kden Wagster, [place] Management [phone]

## 2018-09-18 ENCOUNTER — Other Ambulatory Visit: Payer: Self-pay

## 2018-09-18 ENCOUNTER — Inpatient Hospital Stay: Payer: HMO

## 2018-09-18 ENCOUNTER — Encounter: Payer: Self-pay | Admitting: Hematology & Oncology

## 2018-09-18 ENCOUNTER — Inpatient Hospital Stay: Payer: HMO | Attending: Family

## 2018-09-18 ENCOUNTER — Inpatient Hospital Stay (HOSPITAL_BASED_OUTPATIENT_CLINIC_OR_DEPARTMENT_OTHER): Payer: HMO | Admitting: Hematology & Oncology

## 2018-09-18 VITALS — BP 102/49 | HR 66 | Temp 97.5°F | Resp 19 | Wt 87.0 lb

## 2018-09-18 DIAGNOSIS — D508 Other iron deficiency anemias: Secondary | ICD-10-CM | POA: Diagnosis not present

## 2018-09-18 DIAGNOSIS — D631 Anemia in chronic kidney disease: Secondary | ICD-10-CM

## 2018-09-18 DIAGNOSIS — N189 Chronic kidney disease, unspecified: Secondary | ICD-10-CM

## 2018-09-18 DIAGNOSIS — Z9981 Dependence on supplemental oxygen: Secondary | ICD-10-CM | POA: Diagnosis not present

## 2018-09-18 DIAGNOSIS — Z862 Personal history of diseases of the blood and blood-forming organs and certain disorders involving the immune mechanism: Secondary | ICD-10-CM | POA: Insufficient documentation

## 2018-09-18 DIAGNOSIS — K909 Intestinal malabsorption, unspecified: Secondary | ICD-10-CM | POA: Insufficient documentation

## 2018-09-18 DIAGNOSIS — D500 Iron deficiency anemia secondary to blood loss (chronic): Secondary | ICD-10-CM

## 2018-09-18 DIAGNOSIS — Z7901 Long term (current) use of anticoagulants: Secondary | ICD-10-CM

## 2018-09-18 DIAGNOSIS — J449 Chronic obstructive pulmonary disease, unspecified: Secondary | ICD-10-CM | POA: Diagnosis not present

## 2018-09-18 LAB — CMP (CANCER CENTER ONLY)
ALT: 14 U/L (ref 0–44)
AST: 20 U/L (ref 15–41)
Albumin: 3.5 g/dL (ref 3.5–5.0)
Alkaline Phosphatase: 119 U/L (ref 38–126)
Anion gap: 7 (ref 5–15)
BUN: 18 mg/dL (ref 8–23)
CO2: 31 mmol/L (ref 22–32)
Calcium: 8.4 mg/dL — ABNORMAL LOW (ref 8.9–10.3)
Chloride: 100 mmol/L (ref 98–111)
Creatinine: 0.93 mg/dL (ref 0.44–1.00)
GFR, Est AFR Am: >60 mL/min (ref >60)
GFR, Estimated: >60 mL/min (ref >60)
Glucose, Bld: 110 mg/dL — ABNORMAL HIGH (ref 70–99)
Potassium: 4.9 mmol/L (ref 3.5–5.1)
Sodium: 138 mmol/L (ref 135–145)
Total Bilirubin: 0.3 mg/dL (ref 0.3–1.2)
Total Protein: 6.4 g/dL — ABNORMAL LOW (ref 6.5–8.1)

## 2018-09-18 LAB — CBC WITH DIFFERENTIAL (CANCER CENTER ONLY)
Abs Immature Granulocytes: 0.09 10*3/uL — ABNORMAL HIGH (ref 0.00–0.07)
Basophils Absolute: 0.1 10*3/uL (ref 0.0–0.1)
Basophils Relative: 1 %
Eosinophils Absolute: 0.2 10*3/uL (ref 0.0–0.5)
Eosinophils Relative: 3 %
HCT: 33.2 % — ABNORMAL LOW (ref 36.0–46.0)
Hemoglobin: 10.2 g/dL — ABNORMAL LOW (ref 12.0–15.0)
Immature Granulocytes: 1 %
Lymphocytes Relative: 7 %
Lymphs Abs: 0.6 10*3/uL — ABNORMAL LOW (ref 0.7–4.0)
MCH: 29.1 pg (ref 26.0–34.0)
MCHC: 30.7 g/dL (ref 30.0–36.0)
MCV: 94.6 fL (ref 80.0–100.0)
Monocytes Absolute: 0.7 10*3/uL (ref 0.1–1.0)
Monocytes Relative: 8 %
Neutro Abs: 6.8 10*3/uL (ref 1.7–7.7)
Neutrophils Relative %: 80 %
Platelet Count: 290 10*3/uL (ref 150–400)
RBC: 3.51 MIL/uL — ABNORMAL LOW (ref 3.87–5.11)
RDW: 13.5 % (ref 11.5–15.5)
WBC Count: 8.5 10*3/uL (ref 4.0–10.5)
nRBC: 0 % (ref 0.0–0.2)

## 2018-09-18 LAB — RETICULOCYTES
Immature Retic Fract: 7.8 % (ref 2.3–15.9)
RBC.: 3.5 MIL/uL — ABNORMAL LOW (ref 3.87–5.11)
Retic Count, Absolute: 30.1 10*3/uL (ref 19.0–186.0)
Retic Ct Pct: 0.9 % (ref 0.4–3.1)

## 2018-09-18 MED ORDER — DARBEPOETIN ALFA 300 MCG/0.6ML IJ SOSY
300.0000 ug | PREFILLED_SYRINGE | Freq: Once | INTRAMUSCULAR | Status: AC
  Administered 2018-09-18: 300 ug via SUBCUTANEOUS

## 2018-09-18 MED ORDER — DARBEPOETIN ALFA 300 MCG/0.6ML IJ SOSY
PREFILLED_SYRINGE | INTRAMUSCULAR | Status: AC
  Filled 2018-09-18: qty 0.6

## 2018-09-18 NOTE — Progress Notes (Signed)
Hematology and Oncology Follow Up Visit  [name] [id number] [birth year] 73 y.o. 09/18/2018   Principle Diagnosis:  Iron deficiency anemia Erythropoietin deficient anemia   Current Therapy:   IV iron as indicated Aranesp 300 mcg sq q 3 wks for hgb < 11   Interim History:  [name] is here today for follow-up.  Overall, she is doing okay although she really has been warry of the corona virus.  She really is at high risk for complications if she ever contracted the coronavirus.  She is on supplemental oxygen. She has COPD.  She feels little tired.  Hemoglobin is down to 10.2.  We will give her Aranesp today.  We will see what her iron levels are.  She has had no change in bowel or bladder habits.  Her weight is quite low but stable.  Overall, her performance status is ECOG 2.  Medications:  Allergies as of 09/18/2018      Reactions   Dabigatran Etexilate Mesylate Other (See Comments)   INTERNAL BLEEDING-pradaxa   Talwin [pentazocine] Other (See Comments)   hallucinations   Lactate Rash      Medication List       Accurate as of September 18, 2018  1:31 PM. If you have any questions, ask your nurse or doctor.        acetaminophen 325 MG tablet Commonly known as: TYLENOL Take 2 tablets (650 mg total) by mouth every 4 (four) hours as needed.   albuterol 108 (90 Base) MCG/ACT inhaler Commonly known as: ProAir HFA Inhale 2 puffs into the lungs every 6 (six) hours as needed for wheezing or shortness of breath.   albuterol 108 (90 Base) MCG/ACT inhaler Commonly known as: VENTOLIN HFA Inhale 2 puffs into the lungs every 6 (six) hours as needed for wheezing or shortness of breath.   amLODipine 2.5 MG tablet Commonly known as: NORVASC Take 1 tablet (2.5 mg total) by mouth daily.   apixaban 5 MG Tabs tablet Commonly known as: Eliquis Take 1 tablet (5 mg total) by mouth 2 (two) times daily.   atorvastatin 20 MG tablet Commonly known as: LIPITOR Take 1 tablet (20 mg  total) by mouth daily at 6 PM.   carvedilol 25 MG tablet Commonly known as: COREG TAKE 1 TABLET BY MOUTH TWICE DAILY   denosumab 60 MG/ML Sosy injection Commonly known as: PROLIA Inject 60 mg into the skin every 6 (six) months.   furosemide 40 MG tablet Commonly known as: LASIX TAKE ONE TABLET (40mg ) BY MOUTH IN THE MORNING   gabapentin 600 MG tablet Commonly known as: NEURONTIN Take 1 tablet by mouth 4 times daily   Incruse Ellipta 62.5 MCG/INH Aepb Generic drug: umeclidinium bromide Inhale 1 puff into the lungs daily.   lidocaine-prilocaine cream Commonly known as: EMLA Apply 1 application topically as needed.   nitrofurantoin 100 MG capsule Commonly known as: MACRODANTIN Take 100 mg by mouth daily.   nitroGLYCERIN 0.4 MG SL tablet Commonly known as: NITROSTAT Place 1 tablet (0.4 mg total) under the tongue every 5 (five) minutes as needed. CHEST PAIN   oxyCODONE-acetaminophen 10-325 MG tablet Commonly known as: PERCOCET Take 1 tablet by mouth every 4 (four) hours as needed. PAIN   pantoprazole 40 MG tablet Commonly known as: PROTONIX Take 40 mg by mouth daily.   promethazine 25 MG tablet Commonly known as: PHENERGAN TAKE ONE TABLET BY MOUTH EVERY 8 HOURS AS NEEDED FOR NAUSEA   tiotropium 18 MCG inhalation capsule Commonly known  as: SPIRIVA Place 18 mcg into inhaler and inhale daily.   tiZANidine 4 MG tablet Commonly known as: ZANAFLEX Take 1 tablet (4 mg total) by mouth 3 (three) times daily as needed. for muscle spams   triamcinolone ointment 0.1 % Commonly known as: KENALOG APPLY TO SKIN TWICE DAILY FOR 14 DAYS   zolpidem 5 MG tablet Commonly known as: AMBIEN TAKE 1 TABLET BY MOUTH EVERY DAY AT BEDTIME AS NEEDED       Allergies:  Allergies  Allergen Reactions  . Dabigatran Etexilate Mesylate Other (See Comments)    INTERNAL BLEEDING-pradaxa  . Talwin [Pentazocine] Other (See Comments)    hallucinations  . Lactate Rash    Past Medical  History, Surgical history, Social history, and Family History were reviewed and updated.  Review of Systems: Review of Systems  Constitutional: Negative.   HENT: Negative.   Eyes: Negative.   Respiratory: Positive for shortness of breath.   Gastrointestinal: Negative.   Genitourinary: Negative.   Musculoskeletal: Negative.   Skin: Negative.   Neurological: Negative.   Endo/Heme/Allergies: Negative.   Psychiatric/Behavioral: Negative.       Physical Exam:  weight is 87 lb (39.5 kg). Her oral temperature is 97.5 F (36.4 C) (abnormal). Her blood pressure is 102/49 (abnormal) and her pulse is 66. Her respiration is 19 and oxygen saturation is 100%.   Wt Readings from Last 3 Encounters:  09/18/18 87 lb (39.5 kg)  09/05/18 86 lb (39 kg)  08/18/18 86 lb (39 kg)    Physical Exam Vitals signs reviewed.  HENT:     Head: Normocephalic and atraumatic.  Eyes:     Pupils: Pupils are equal, round, and reactive to light.  Neck:     Musculoskeletal: Normal range of motion.  Cardiovascular:     Rate and Rhythm: Normal rate and regular rhythm.     Heart sounds: Normal heart sounds.  Pulmonary:     Effort: Pulmonary effort is normal.     Breath sounds: Normal breath sounds.  Abdominal:     General: Bowel sounds are normal.     Palpations: Abdomen is soft.  Musculoskeletal: Normal range of motion.        General: No tenderness or deformity.  Lymphadenopathy:     Cervical: No cervical adenopathy.  Skin:    General: Skin is warm and dry.     Findings: No erythema or rash.  Neurological:     Mental Status: She is alert and oriented to person, place, and time.  Psychiatric:        Behavior: Behavior normal.        Thought Content: Thought content normal.        Judgment: Judgment normal.      Lab Results  Component Value Date   WBC 8.5 09/18/2018   HGB 10.2 (L) 09/18/2018   HCT 33.2 (L) 09/18/2018   MCV 94.6 09/18/2018   PLT 290 09/18/2018   Lab Results  Component Value  Date   FERRITIN 795 (H) 08/18/2018   IRON 74 08/18/2018   TIBC 198 (L) 08/18/2018   UIBC 123 08/18/2018   IRONPCTSAT 38 08/18/2018   Lab Results  Component Value Date   RETICCTPCT 0.9 09/18/2018   RBC 3.50 (L) 09/18/2018   RETICCTABS 64.6 04/01/2009   No results found for: KPAFRELGTCHN, LAMBDASER, KAPLAMBRATIO No results found for: IGGSERUM, IGA, IGMSERUM No results found for: TOTALPROTELP, ALBUMINELP, A1GS, A2GS, BETS, BETA2SER, GAMS, MSPIKE, SPEI   Chemistry      Component  Value Date/Time   NA 138 09/18/2018 1159   K 4.9 09/18/2018 1159   CL 100 09/18/2018 1159   CO2 31 09/18/2018 1159   BUN 18 09/18/2018 1159   CREATININE 0.93 09/18/2018 1159   CREATININE 0.91 03/16/2016 1539      Component Value Date/Time   CALCIUM 8.4 (L) 09/18/2018 1159   ALKPHOS 119 09/18/2018 1159   AST 20 09/18/2018 1159   ALT 14 09/18/2018 1159   BILITOT 0.3 09/18/2018 1159       Impression and Plan: Ms. Braddy is a very pleasant 73 yo caucasian female with iron deficiency anemia secondary to malabsorption and possible GI blood loss with Eliquis.   Again, we will give her Aranesp today.  I know this will help her.  This will get her hemoglobin back up a little bit higher.  We will see what her iron levels are.  She needs IV iron, we will call her about this.  I will plan to see her back in another 4 weeks.    Volanda Napoleon, MD 7/2/20201:31 PM

## 2018-09-18 NOTE — Patient Instructions (Signed)
Darbepoetin Alfa injection What is this medicine? DARBEPOETIN ALFA (dar be POE e tin AL fa) helps your body make more red blood cells. It is used to treat anemia caused by chronic kidney failure and chemotherapy. This medicine may be used for other purposes; ask your health care provider or pharmacist if you have questions. COMMON BRAND NAME(S): Aranesp What should I tell my health care provider before I take this medicine? They need to know if you have any of these conditions:  blood clotting disorders or history of blood clots  cancer patient not on chemotherapy  cystic fibrosis  heart disease, such as angina, heart failure, or a history of a heart attack  hemoglobin level of 12 g/dL or greater  high blood pressure  low levels of folate, iron, or vitamin B12  seizures  an unusual or allergic reaction to darbepoetin, erythropoietin, albumin, hamster proteins, latex, other medicines, foods, dyes, or preservatives  pregnant or trying to get pregnant  breast-feeding How should I use this medicine? This medicine is for injection into a vein or under the skin. It is usually given by a health care professional in a hospital or clinic setting. If you get this medicine at home, you will be taught how to prepare and give this medicine. Use exactly as directed. Take your medicine at regular intervals. Do not take your medicine more often than directed. It is important that you put your used needles and syringes in a special sharps container. Do not put them in a trash can. If you do not have a sharps container, call your pharmacist or healthcare provider to get one. A special MedGuide will be given to you by the pharmacist with each prescription and refill. Be sure to read this information carefully each time. Talk to your pediatrician regarding the use of this medicine in children. While this medicine may be used in children as young as 1 month of age for selected conditions, precautions do  apply. Overdosage: If you think you have taken too much of this medicine contact a poison control center or emergency room at once. NOTE: This medicine is only for you. Do not share this medicine with others. What if I miss a dose? If you miss a dose, take it as soon as you can. If it is almost time for your next dose, take only that dose. Do not take double or extra doses. What may interact with this medicine? Do not take this medicine with any of the following medications:  epoetin alfa This list may not describe all possible interactions. Give your health care provider a list of all the medicines, herbs, non-prescription drugs, or dietary supplements you use. Also tell them if you smoke, drink alcohol, or use illegal drugs. Some items may interact with your medicine. What should I watch for while using this medicine? Your condition will be monitored carefully while you are receiving this medicine. You may need blood work done while you are taking this medicine. This medicine may cause a decrease in vitamin B6. You should make sure that you get enough vitamin B6 while you are taking this medicine. Discuss the foods you eat and the vitamins you take with your health care professional. What side effects may I notice from receiving this medicine? Side effects that you should report to your doctor or health care professional as soon as possible:  allergic reactions like skin rash, itching or hives, swelling of the face, lips, or tongue  breathing problems  changes in   vision  chest pain  confusion, trouble speaking or understanding  feeling faint or lightheaded, falls  high blood pressure  muscle aches or pains  pain, swelling, warmth in the leg  rapid weight gain  severe headaches  sudden numbness or weakness of the face, arm or leg  trouble walking, dizziness, loss of balance or coordination  seizures (convulsions)  swelling of the ankles, feet, hands  unusually weak or  tired Side effects that usually do not require medical attention (report to your doctor or health care professional if they continue or are bothersome):  diarrhea  fever, chills (flu-like symptoms)  headaches  nausea, vomiting  redness, stinging, or swelling at site where injected This list may not describe all possible side effects. Call your doctor for medical advice about side effects. You may report side effects to FDA at 1-800-FDA-1088. Where should I keep my medicine? Keep out of the reach of children. Store in a refrigerator between 2 and 8 degrees C (36 and 46 degrees F). Do not freeze. Do not shake. Throw away any unused portion if using a single-dose vial. Throw away any unused medicine after the expiration date. NOTE: This sheet is a summary. It may not cover all possible information. If you have questions about this medicine, talk to your doctor, pharmacist, or health care provider.  2020 Elsevier/Gold Standard (2017-03-20 16:44:20)  

## 2018-09-18 NOTE — Patient Instructions (Signed)

## 2018-09-19 ENCOUNTER — Encounter: Payer: Self-pay | Admitting: Internal Medicine

## 2018-09-19 NOTE — Progress Notes (Signed)
Device interrogateon  Battery approaching ERI

## 2018-09-22 LAB — IRON AND TIBC
Iron: 53 ug/dL (ref 41–142)
Saturation Ratios: 27 % (ref 21–57)
TIBC: 194 ug/dL — ABNORMAL LOW (ref 236–444)
UIBC: 141 ug/dL (ref 120–384)

## 2018-09-22 LAB — FERRITIN: Ferritin: 907 ng/mL — ABNORMAL HIGH (ref 11–307)

## 2018-09-24 ENCOUNTER — Telehealth: Payer: Self-pay | Admitting: Orthopaedic Surgery

## 2018-09-24 NOTE — Addendum Note (Signed)
Addended by: Douglass Rivers D on: 09/24/2018 11:24 AM   Modules accepted: Level of Service

## 2018-09-24 NOTE — Progress Notes (Signed)
Remote pacemaker transmission.   

## 2018-09-25 ENCOUNTER — Ambulatory Visit (INDEPENDENT_AMBULATORY_CARE_PROVIDER_SITE_OTHER): Payer: HMO | Admitting: Pulmonary Disease

## 2018-09-25 ENCOUNTER — Encounter: Payer: Self-pay | Admitting: Pulmonary Disease

## 2018-09-25 ENCOUNTER — Other Ambulatory Visit: Payer: Self-pay

## 2018-09-25 VITALS — BP 116/60 | HR 56 | Temp 98.0°F | Ht 60.0 in | Wt 88.2 lb

## 2018-09-25 DIAGNOSIS — J479 Bronchiectasis, uncomplicated: Secondary | ICD-10-CM

## 2018-09-25 DIAGNOSIS — J9611 Chronic respiratory failure with hypoxia: Secondary | ICD-10-CM | POA: Diagnosis not present

## 2018-09-25 DIAGNOSIS — J432 Centrilobular emphysema: Secondary | ICD-10-CM | POA: Diagnosis not present

## 2018-09-25 NOTE — Patient Instructions (Signed)
Follow up in 6 months 

## 2018-09-25 NOTE — Progress Notes (Signed)
[facility]  Chief Complaint  Patient presents with  . Follow-up    increased O2 to 4 L [place] at all times, increased sob related to heat    Constitutional:  BP 116/60 (BP Location: Left Arm, Cuff Size: Normal)   Pulse (!) 56   Temp 98 F (36.7 C) (Oral)   Ht 5' (1.524 m)   Wt 88 lb 3.2 oz (40 kg)   SpO2 99%   BMI 17.23 kg/m   Past Medical History:  B12 deficiency, SBO, GERD, Silent Aspiration, RLS, dilated CM, LBBB, Malabsorption, HTN, Gastroparesis, Fibromyalgia, Diverticulitis, CAD, A fib  Brief Summary:  [name] is a 73 y.o. female former smoker with COPD/emphysema, chronic hypoxic respiratory failure, and recurrent pneumonia with focal bronchiectasis.  She noticed more trouble with her breathing when out in hot, humid weather.  Tries to avoid going outside.  Had trip to [facility] planned, but cancelled this.    Has occasional cough with sputum.  Not having fever, chest pain, or wheeze.  Using 4 liters oxygen now.  Doesn't have pulse oximeter.  Had to change to incruse for insurance.  Hasn't switched yet because she still had some spiriva leftover.  She has been getting iron injections with Dr. Marin Olp.  Feels more energy after she gets iron.  Hb from 09/18/18 showed mild anemia.  Physical Exam:   Appearance - thin, pale, wearing oxygen  ENMT - no sinus tenderness, no nasal discharge, no oral exudate  Neck - no masses, trachea midline, no thyromegaly, no elevation in JVP  Respiratory - normal appearance of chest wall, normal respiratory effort w/o accessory muscle use, no dullness on percussion, no wheezing or rales  CV - s1s2 regular rate and rhythm, no murmurs, no peripheral edema, radial pulses symmetric  GI - soft, non tender  Lymph - no adenopathy noted in neck and axillary areas  MSK - normal gait  Ext - no cyanosis, clubbing, or joint inflammation noted  Skin - no rashes, lesions, or ulcers  Neuro - normal  strength, oriented x 3  Psych - normal mood and affect  CBC Latest Ref Rng & Units 09/18/2018 08/18/2018 07/24/2018  WBC 4.0 - 10.5 K/uL 8.5 6.8 6.8  Hemoglobin 12.0 - 15.0 g/dL 10.2(L) 11.9(L) 9.5(L)  Hematocrit 36.0 - 46.0 % 33.2(L) 39.0 29.9(L)  Platelets 150 - 400 K/uL 290 264 245   Iron/TIBC/Ferritin/ %Sat    Component Value Date/Time   IRON 53 09/18/2018 1200   TIBC 194 (L) 09/18/2018 1200   FERRITIN 907 (H) 09/18/2018 1200   IRONPCTSAT 27 09/18/2018 1200   IRONPCTSAT 12 (L) 02/17/2014 1541   CMP Latest Ref Rng & Units 09/18/2018 08/18/2018 08/18/2018  Glucose 70 - 99 mg/dL 110(H) 108(H) 90  BUN 8 - 23 mg/dL 18 16 16   Creatinine 0.44 - 1.00 mg/dL 0.93 0.82 0.79  Sodium 135 - 145 mmol/L 138 140 139  Potassium 3.5 - 5.1 mmol/L 4.9 4.4 4.3  Chloride 98 - 111 mmol/L 100 101 99  CO2 22 - 32 mmol/L 31 32 32  Calcium 8.9 - 10.3 mg/dL 8.4(L) 7.8(L) 8.1(L)  Total Protein 6.5 - 8.1 g/dL 6.4(L) 6.7 6.3  Total Bilirubin 0.3 - 1.2 mg/dL 0.3 0.3 0.3  Alkaline Phos 38 - 126 U/L 119 98 113  AST 15 - 41 U/L 20 19 20   ALT 0 - 44 U/L 14 11 10     Assessment/Plan:   Bronchiectasis. - prn flutter valve, mucinex  COPD  with emphysema. - she will change from spiriva to incruse due to insurance requirements - prn albuterol  Chronic respiratory failure with hypoxia. - 4 liters oxygen - goal SpO2 > 90% - advised her to try getting portable pulse oximeter to use at home  Plaque psoriasis. - followed by Dr. Renda Rolls with dermatology - she was getting frequent respiratory infections when she was previously on MTX  Iron and erythropoietin deficiency anemia. - f/u with Dr. Marin Olp   Patient Instructions  Follow up in 6 months   Chesley Mires, MD [place] Pager: [phone] 09/25/2018, 12:35 PM  Flow Sheet     Pulmonary tests:  PFT4/12/10>>FEV1 1.95 (97%), FEV1% 70, TLC 3.88 (87%), DLCO 64% ONO 02/11/10 >> Test time 9 hrs 9 min, Mean SpO2 87%, low SpO2 80%,  Spent 5hrs 57 min (65%) with SpO2 < 88%. PFT7/24/13>> FEV1 1.15 (59%), FEV1% 64, TLC 3.26 (73%), DLCO 35%, + BD CT chest 04/30/2011 >> Chronic multifocal nodular and patchy airspace densities throughout the right lung with mild BTX CT chest 03/28/17 >> mild/mod cylindrical BTX diffuse and b/l, tree in bud, nodular infiltrates  Cardiac tests:  Echo 05/18/14 >> EF 60 to 74%, grade 1 diastolic dysfx, PAS 30 mmHg  Medications:   Allergies as of 09/25/2018      Reactions   Dabigatran Etexilate Mesylate Other (See Comments)   INTERNAL BLEEDING-pradaxa   Talwin [pentazocine] Other (See Comments)   hallucinations   Lactate Rash      Medication List       Accurate as of September 25, 2018 12:35 PM. If you have any questions, ask your nurse or doctor.        acetaminophen 325 MG tablet Commonly known as: TYLENOL Take 2 tablets (650 mg total) by mouth every 4 (four) hours as needed.   albuterol 108 (90 Base) MCG/ACT inhaler Commonly known as: ProAir HFA Inhale 2 puffs into the lungs every 6 (six) hours as needed for wheezing or shortness of breath.   albuterol 108 (90 Base) MCG/ACT inhaler Commonly known as: VENTOLIN HFA Inhale 2 puffs into the lungs every 6 (six) hours as needed for wheezing or shortness of breath.   amLODipine 2.5 MG tablet Commonly known as: NORVASC Take 1 tablet (2.5 mg total) by mouth daily.   apixaban 5 MG Tabs tablet Commonly known as: Eliquis Take 1 tablet (5 mg total) by mouth 2 (two) times daily.   atorvastatin 20 MG tablet Commonly known as: LIPITOR Take 1 tablet (20 mg total) by mouth daily at 6 PM.   carvedilol 25 MG tablet Commonly known as: COREG TAKE 1 TABLET BY MOUTH TWICE DAILY   denosumab 60 MG/ML Sosy injection Commonly known as: PROLIA Inject 60 mg into the skin every 6 (six) months.   furosemide 40 MG tablet Commonly known as: LASIX TAKE ONE TABLET (40mg ) BY MOUTH IN THE MORNING   gabapentin 600 MG tablet Commonly known as: NEURONTIN  Take 1 tablet by mouth 4 times daily   Incruse Ellipta 62.5 MCG/INH Aepb Generic drug: umeclidinium bromide Inhale 1 puff into the lungs daily.   lidocaine-prilocaine cream Commonly known as: EMLA Apply 1 application topically as needed.   nitrofurantoin 100 MG capsule Commonly known as: MACRODANTIN Take 100 mg by mouth daily.   nitroGLYCERIN 0.4 MG SL tablet Commonly known as: NITROSTAT Place 1 tablet (0.4 mg total) under the tongue every 5 (five) minutes as needed. CHEST PAIN   oxyCODONE-acetaminophen 10-325 MG tablet Commonly known as: PERCOCET Take  1 tablet by mouth every 4 (four) hours as needed. PAIN   pantoprazole 40 MG tablet Commonly known as: PROTONIX Take 40 mg by mouth daily.   promethazine 25 MG tablet Commonly known as: PHENERGAN TAKE ONE TABLET BY MOUTH EVERY 8 HOURS AS NEEDED FOR NAUSEA   tiotropium 18 MCG inhalation capsule Commonly known as: SPIRIVA Place 18 mcg into inhaler and inhale daily.   tiZANidine 4 MG tablet Commonly known as: ZANAFLEX Take 1 tablet (4 mg total) by mouth 3 (three) times daily as needed. for muscle spams   triamcinolone ointment 0.1 % Commonly known as: KENALOG APPLY TO SKIN TWICE DAILY FOR 14 DAYS   zolpidem 5 MG tablet Commonly known as: AMBIEN TAKE 1 TABLET BY MOUTH EVERY DAY AT BEDTIME AS NEEDED       Past Surgical History:  She  has a past surgical history that includes Appendectomy; pacemaker placement (12/21/2009); Peg removed; Tonsillectomy (age 29); Interstim implant revision (03/06/2011); Portacath placement (12/16/2009); Total abdominal hysterectomy; Interstim Implant placement (05/28/2006 - stage I); Interstim implant revision (10/23/2007); Cystoscopy with injection (04/30/2006); Small intestine surgery (05/20/2001); Cardiac catheterization (04/08/2006, 11/16/2009); Cystoscopy (11/11/2008); Cystoscopy w/ retrogrades (11/11/2008); Colectomy; Colectomy (02/04/2000); Gastrocutaneous fistula closure; Exploratory laparotomy  (04/27/2009); Port-a-cath removal (07/27/2011); Total hip arthroplasty (08/03/2011); Interstim Implant placement (02/05/2012); Esophagogastroduodenoscopy (N/A, 02/02/2013); Savory dilation (N/A, 02/02/2013); ERCP (N/A, 06/23/2017); ERCP (N/A, 06/24/2017); Cholecystectomy (N/A, 06/27/2017); and IR IMAGING GUIDED PORT INSERTION (10/14/2017).  Family History:  Her family history includes Breast cancer in her maternal aunt; Diabetes in her paternal grandfather; Heart disease in her brother, father, mother, and paternal aunt; Stroke in her mother.  Social History:  She  reports that she quit smoking about 32 years ago. She has a 20.00 pack-year smoking history. She has never used smokeless tobacco. She reports that she does not drink alcohol or use drugs.

## 2018-09-29 ENCOUNTER — Other Ambulatory Visit: Payer: Self-pay | Admitting: Family Medicine

## 2018-09-29 ENCOUNTER — Ambulatory Visit (INDEPENDENT_AMBULATORY_CARE_PROVIDER_SITE_OTHER): Payer: HMO | Admitting: Orthopaedic Surgery

## 2018-09-29 ENCOUNTER — Other Ambulatory Visit: Payer: Self-pay

## 2018-09-29 ENCOUNTER — Encounter: Payer: Self-pay | Admitting: Orthopaedic Surgery

## 2018-09-29 ENCOUNTER — Ambulatory Visit: Payer: Self-pay | Admitting: Pharmacist

## 2018-09-29 DIAGNOSIS — M4807 Spinal stenosis, lumbosacral region: Secondary | ICD-10-CM

## 2018-09-29 DIAGNOSIS — R52 Pain, unspecified: Secondary | ICD-10-CM

## 2018-09-29 MED ORDER — OXYCODONE-ACETAMINOPHEN 10-325 MG PO TABS: 1.0000 | ORAL_TABLET | ORAL | 0 refills | Status: DC | PRN

## 2018-09-29 NOTE — Telephone Encounter (Signed)
Medication:oxyCODONE-acetaminophen (PERCOCET) 10-325 MG tablet [[id number]]   [facility] [place] [phone] (Phone) [phone] (Fax)

## 2018-09-29 NOTE — Telephone Encounter (Signed)
Oxycodone refill.   Last OV: 08/04/2018 Last Fill: 09/02/2018 #120 and 0RF UDS: 03/07/2017 Low risk

## 2018-09-29 NOTE — Progress Notes (Signed)
The patient comes in for review of a CT myelogram that was obtained of her lumbar spine.  She is someone who is on oxygen.  She is a very cachectic individual.  She has had left-sided low back pain and a stabbing pain with numbness and tingling that does go down her left leg.  She is on chronic narcotic pain medications and is on gabapentin as well.  She is still been consistent with her signs and symptoms of low back pain that radiates down her left leg.  She says that has not changed.  She denies any change in bowel or bladder function.  It is been consistent in terms of not increasing but not decreasing either.  On exam again she is a very thin and cachectic individual.  She has a positive straight leg raise to the left side and some slight numbness on the side of her leg which is not present on the other side of her leg.  She is walking without an assistive device.  I do not sense significant strength differences in her legs.  The CT myelogram is assessed and reviewed and does show L4 on L5 anterolisthesis with significant left lateral recess stenosis.  This appears to be likely affecting the left L4 nerve.  I do feel that she is at least a candidate for intervention by Dr. Ernestina Patches at the left L4-5 level.  She would like a referral for that type of injection as well.  We will work on getting that scheduled.

## 2018-10-03 ENCOUNTER — Other Ambulatory Visit: Payer: Self-pay | Admitting: Pharmacy Technician

## 2018-10-03 NOTE — Patient Outreach (Signed)
[facility]) Care Management  10/03/2018  [name] 08-06-1945 [id number]    Successful outreach call placed to patient in regards to [name] application for Incruse.  Spoke to patient's husband, [name], as patient was having a "coughing spell" at the time of the call. HIPAA identifiers verified.  Inquired if this was a good time to talk and patient's husband said to proceed. Patient's husband informed patient received 3 inhalers from [facility]. Briefly discussed refill procedures with husband so that he could get back to his wife. Patient's husband was extremely thankful and appreciative of the help we extended to his wife. Confirmed patient's husband  had name and number for future reference and for any questions he or his wife may have in ordering refills from the various patient assistance companies. Patient's husband informed he had no other questions or concerns at this time related to the patient assistance medications.  Will route note to [facility] that patient assistance has been completed and will remove myself from the care team.  Luiz Ochoa. [place], [facility] [phone]

## 2018-10-11 DIAGNOSIS — I4891 Unspecified atrial fibrillation: Secondary | ICD-10-CM | POA: Diagnosis not present

## 2018-10-11 DIAGNOSIS — J449 Chronic obstructive pulmonary disease, unspecified: Secondary | ICD-10-CM | POA: Diagnosis not present

## 2018-10-13 ENCOUNTER — Other Ambulatory Visit: Payer: Self-pay | Admitting: Family Medicine

## 2018-10-13 DIAGNOSIS — G4700 Insomnia, unspecified: Secondary | ICD-10-CM

## 2018-10-14 NOTE — Telephone Encounter (Signed)
Requesting: [facility] Contract: 03/07/2017 UDS: 03/07/2017, low risk, 09/05/2017 Last OV: 08/04/2018 Next OV: 11/03/2018 Last Refill: 09/15/2018, #30--0 RF Database:   Please advise

## 2018-10-15 ENCOUNTER — Ambulatory Visit (INDEPENDENT_AMBULATORY_CARE_PROVIDER_SITE_OTHER): Payer: HMO | Admitting: *Deleted

## 2018-10-15 DIAGNOSIS — I420 Dilated cardiomyopathy: Secondary | ICD-10-CM

## 2018-10-15 DIAGNOSIS — I480 Paroxysmal atrial fibrillation: Secondary | ICD-10-CM

## 2018-10-15 LAB — CUP PACEART REMOTE DEVICE CHECK
Battery Remaining Longevity: 5 mo
Battery Voltage: 2.83 V
Brady Statistic AP VP Percent: 0.56 %
Brady Statistic AP VS Percent: 0.01 %
Brady Statistic AS VP Percent: 99.38 %
Brady Statistic AS VS Percent: 0.06 %
Brady Statistic RA Percent Paced: 0.55 %
Brady Statistic RV Percent Paced: 99.91 %
Date Time Interrogation Session: 20200729181138
Implantable Lead Implant Date: 20111005
Implantable Lead Implant Date: 20111005
Implantable Lead Implant Date: 20111005
Implantable Lead Location: 753858
Implantable Lead Location: 753859
Implantable Lead Location: 753860
Implantable Lead Model: 4196
Implantable Lead Model: 5076
Implantable Lead Model: 5076
Implantable Pulse Generator Implant Date: 20111005
Lead Channel Impedance Value: 323 Ohm
Lead Channel Impedance Value: 399 Ohm
Lead Channel Impedance Value: 418 Ohm
Lead Channel Impedance Value: 437 Ohm
Lead Channel Impedance Value: 437 Ohm
Lead Channel Impedance Value: 475 Ohm
Lead Channel Impedance Value: 513 Ohm
Lead Channel Impedance Value: 532 Ohm
Lead Channel Impedance Value: 703 Ohm
Lead Channel Pacing Threshold Amplitude: 0.5 V
Lead Channel Pacing Threshold Amplitude: 0.5 V
Lead Channel Pacing Threshold Pulse Width: 0.4 ms
Lead Channel Pacing Threshold Pulse Width: 0.4 ms
Lead Channel Sensing Intrinsic Amplitude: 0.875 mV
Lead Channel Sensing Intrinsic Amplitude: 0.875 mV
Lead Channel Sensing Intrinsic Amplitude: 4.875 mV
Lead Channel Sensing Intrinsic Amplitude: 4.875 mV
Lead Channel Setting Pacing Amplitude: 1.25 V
Lead Channel Setting Pacing Amplitude: 2 V
Lead Channel Setting Pacing Amplitude: 2.5 V
Lead Channel Setting Pacing Pulse Width: 0.4 ms
Lead Channel Setting Pacing Pulse Width: 0.8 ms
Lead Channel Setting Sensing Sensitivity: 0.9 mV

## 2018-10-16 ENCOUNTER — Inpatient Hospital Stay: Payer: HMO

## 2018-10-16 ENCOUNTER — Inpatient Hospital Stay (HOSPITAL_BASED_OUTPATIENT_CLINIC_OR_DEPARTMENT_OTHER): Payer: HMO | Admitting: Family

## 2018-10-16 ENCOUNTER — Other Ambulatory Visit: Payer: Self-pay

## 2018-10-16 ENCOUNTER — Encounter: Payer: Self-pay | Admitting: Family

## 2018-10-16 VITALS — BP 145/67 | HR 64 | Temp 97.7°F | Resp 17 | Ht 60.0 in | Wt 85.0 lb

## 2018-10-16 DIAGNOSIS — D631 Anemia in chronic kidney disease: Secondary | ICD-10-CM

## 2018-10-16 DIAGNOSIS — Z7901 Long term (current) use of anticoagulants: Secondary | ICD-10-CM | POA: Diagnosis not present

## 2018-10-16 DIAGNOSIS — D508 Other iron deficiency anemias: Secondary | ICD-10-CM | POA: Diagnosis not present

## 2018-10-16 DIAGNOSIS — N189 Chronic kidney disease, unspecified: Secondary | ICD-10-CM | POA: Diagnosis not present

## 2018-10-16 DIAGNOSIS — Z95828 Presence of other vascular implants and grafts: Secondary | ICD-10-CM

## 2018-10-16 DIAGNOSIS — K909 Intestinal malabsorption, unspecified: Secondary | ICD-10-CM | POA: Diagnosis not present

## 2018-10-16 DIAGNOSIS — D500 Iron deficiency anemia secondary to blood loss (chronic): Secondary | ICD-10-CM

## 2018-10-16 LAB — CBC WITH DIFFERENTIAL (CANCER CENTER ONLY)
Abs Immature Granulocytes: 0.09 10*3/uL — ABNORMAL HIGH (ref 0.00–0.07)
Basophils Absolute: 0 10*3/uL (ref 0.0–0.1)
Basophils Relative: 1 %
Eosinophils Absolute: 0.1 10*3/uL (ref 0.0–0.5)
Eosinophils Relative: 2 %
HCT: 39.5 % (ref 36.0–46.0)
Hemoglobin: 11.8 g/dL — ABNORMAL LOW (ref 12.0–15.0)
Immature Granulocytes: 1 %
Lymphocytes Relative: 7 %
Lymphs Abs: 0.6 10*3/uL — ABNORMAL LOW (ref 0.7–4.0)
MCH: 27.4 pg (ref 26.0–34.0)
MCHC: 29.9 g/dL — ABNORMAL LOW (ref 30.0–36.0)
MCV: 91.6 fL (ref 80.0–100.0)
Monocytes Absolute: 0.6 10*3/uL (ref 0.1–1.0)
Monocytes Relative: 7 %
Neutro Abs: 7.2 10*3/uL (ref 1.7–7.7)
Neutrophils Relative %: 82 %
Platelet Count: 267 10*3/uL (ref 150–400)
RBC: 4.31 MIL/uL (ref 3.87–5.11)
RDW: 14.2 % (ref 11.5–15.5)
WBC Count: 8.6 10*3/uL (ref 4.0–10.5)
nRBC: 0 % (ref 0.0–0.2)

## 2018-10-16 LAB — RETICULOCYTES
Immature Retic Fract: 3.8 % (ref 2.3–15.9)
RBC.: 4.26 MIL/uL (ref 3.87–5.11)
Retic Count, Absolute: 32.8 10*3/uL (ref 19.0–186.0)
Retic Ct Pct: 0.8 % (ref 0.4–3.1)

## 2018-10-16 LAB — CMP (CANCER CENTER ONLY)
ALT: 22 U/L (ref 0–44)
AST: 20 U/L (ref 15–41)
Albumin: 3.4 g/dL — ABNORMAL LOW (ref 3.5–5.0)
Alkaline Phosphatase: 155 U/L — ABNORMAL HIGH (ref 38–126)
Anion gap: 6 (ref 5–15)
BUN: 16 mg/dL (ref 8–23)
CO2: 32 mmol/L (ref 22–32)
Calcium: 8 mg/dL — ABNORMAL LOW (ref 8.9–10.3)
Chloride: 100 mmol/L (ref 98–111)
Creatinine: 0.71 mg/dL (ref 0.44–1.00)
GFR, Est AFR Am: >60 mL/min (ref >60)
GFR, Estimated: >60 mL/min (ref >60)
Glucose, Bld: 105 mg/dL — ABNORMAL HIGH (ref 70–99)
Potassium: 4.4 mmol/L (ref 3.5–5.1)
Sodium: 138 mmol/L (ref 135–145)
Total Bilirubin: 0.3 mg/dL (ref 0.3–1.2)
Total Protein: 6.4 g/dL — ABNORMAL LOW (ref 6.5–8.1)

## 2018-10-16 MED ORDER — SODIUM CHLORIDE 0.9% FLUSH
10.0000 mL | INTRAVENOUS | Status: DC | PRN
  Administered 2018-10-16: 10 mL via INTRAVENOUS
  Filled 2018-10-16: qty 10

## 2018-10-16 MED ORDER — HEPARIN SOD (PORK) LOCK FLUSH 100 UNIT/ML IV SOLN
500.0000 [IU] | Freq: Once | INTRAVENOUS | Status: AC
  Administered 2018-10-16: 500 [IU] via INTRAVENOUS
  Filled 2018-10-16: qty 5

## 2018-10-16 NOTE — Progress Notes (Signed)
Hematology and Oncology Follow Up Visit  ILLA ENLOW [id number] 05/23/1945 73 y.o. 10/16/2018   Principle Diagnosis:  Iron deficiency anemia Erythropoietin deficient anemia   Current Therapy:   IV iron as indicated Aranesp 300 mcg sq q 3 wks for hgb < 11   Interim History:  Ms. Funaro is here today for follow-up. She is doing well. Hgb is stable at 11.8, MCV 91.6.  She has had no episodes of bleeding. She does bruise easily on her arms.  She has some SOB with over exertion and will take a break to rest if needed. She states that she does well on 3-4 L supplemental O2 24 hours a day.  No fever, chills, n/v, cough, rash, dizziness, chest pain, palpitations, abdominal pain or changes in bladder or bowel habits.  She still has episodes of diarrhea off and on with nausea. She has history of obstruction which she is very mindful of and know what signs and symptoms to look for.  No swelling or tenderness in her extremities. The numbness and tingling in her fingertips and toes is unchanged.  She states that she has a bulging disc in the lumbar spine and will be starting steroid injection next week.  No falls or syncopal episodes to report.  She is eating well and staying hydrated. Her weight is stable.   ECOG Performance Status: 1 - Symptomatic but completely ambulatory  Medications:  Allergies as of 10/16/2018      Reactions   Dabigatran Etexilate Mesylate Other (See Comments)   INTERNAL BLEEDING-pradaxa   Talwin [pentazocine] Other (See Comments)   hallucinations   Lactate Rash      Medication List       Accurate as of October 16, 2018 12:19 PM. If you have any questions, ask your nurse or doctor.        acetaminophen 325 MG tablet Commonly known as: TYLENOL Take 2 tablets (650 mg total) by mouth every 4 (four) hours as needed.   albuterol 108 (90 Base) MCG/ACT inhaler Commonly known as: VENTOLIN HFA Inhale 2 puffs into the lungs every 6 (six) hours as needed for wheezing  or shortness of breath.   amLODipine 2.5 MG tablet Commonly known as: NORVASC Take 1 tablet (2.5 mg total) by mouth daily.   apixaban 5 MG Tabs tablet Commonly known as: Eliquis Take 1 tablet (5 mg total) by mouth 2 (two) times daily.   atorvastatin 20 MG tablet Commonly known as: LIPITOR Take 1 tablet (20 mg total) by mouth daily at 6 PM.   carvedilol 25 MG tablet Commonly known as: COREG TAKE 1 TABLET BY MOUTH TWICE DAILY   denosumab 60 MG/ML Sosy injection Commonly known as: PROLIA Inject 60 mg into the skin every 6 (six) months.   furosemide 40 MG tablet Commonly known as: LASIX TAKE ONE TABLET (40mg ) BY MOUTH IN THE MORNING   gabapentin 600 MG tablet Commonly known as: NEURONTIN Take 1 tablet by mouth 4 times daily   Incruse Ellipta 62.5 MCG/INH Aepb Generic drug: umeclidinium bromide Inhale 1 puff into the lungs daily.   lidocaine-prilocaine cream Commonly known as: EMLA Apply 1 application topically as needed.   nitrofurantoin 100 MG capsule Commonly known as: MACRODANTIN Take 100 mg by mouth daily.   nitroGLYCERIN 0.4 MG SL tablet Commonly known as: NITROSTAT Place 1 tablet (0.4 mg total) under the tongue every 5 (five) minutes as needed. CHEST PAIN   oxyCODONE-acetaminophen 10-325 MG tablet Commonly known as: PERCOCET Take 1 tablet by  mouth every 4 (four) hours as needed. PAIN   pantoprazole 40 MG tablet Commonly known as: PROTONIX Take 40 mg by mouth daily.   promethazine 25 MG tablet Commonly known as: PHENERGAN TAKE ONE TABLET BY MOUTH EVERY 8 HOURS AS NEEDED FOR NAUSEA   tiotropium 18 MCG inhalation capsule Commonly known as: SPIRIVA Place 18 mcg into inhaler and inhale daily.   tiZANidine 4 MG tablet Commonly known as: ZANAFLEX TAKE 1 TABLET BY MOUTH THREE TIMES DAILY AS NEEDED FOR  MUSCLE  SPAMS   triamcinolone ointment 0.1 % Commonly known as: KENALOG APPLY TO SKIN TWICE DAILY FOR 14 DAYS   zolpidem 5 MG tablet Commonly known as:  AMBIEN TAKE 1 TABLET BY MOUTH EVERY DAY AT BEDTIME AS NEEDED       Allergies:  Allergies  Allergen Reactions  . Dabigatran Etexilate Mesylate Other (See Comments)    INTERNAL BLEEDING-pradaxa  . Talwin [Pentazocine] Other (See Comments)    hallucinations  . Lactate Rash    Past Medical History, Surgical history, Social history, and Family History were reviewed and updated.  Review of Systems: All other 10 point review of systems is negative.   Physical Exam:  height is 5' (1.524 m) and weight is 85 lb (38.6 kg). Her temporal temperature is 97.7 F (36.5 C). Her blood pressure is 145/67 (abnormal) and her pulse is 64. Her respiration is 17 and oxygen saturation is 100%.   Wt Readings from Last 3 Encounters:  10/16/18 85 lb (38.6 kg)  09/25/18 88 lb 3.2 oz (40 kg)  09/18/18 87 lb (39.5 kg)    Ocular: Sclerae unicteric, pupils equal, round and reactive to light Ear-nose-throat: Oropharynx clear, dentition fair Lymphatic: No cervical or supraclavicular adenopathy Lungs no rales or rhonchi, good excursion bilaterally Heart regular rate and rhythm, no murmur appreciated Abd soft, nontender, positive bowel sounds, no liver or spleen tip palpated on exam, no fluid wave  MSK no focal spinal tenderness, no joint edema Neuro: non-focal, well-oriented, appropriate affect Breasts: Deferred   Lab Results  Component Value Date   WBC 8.6 10/16/2018   HGB 11.8 (L) 10/16/2018   HCT 39.5 10/16/2018   MCV 91.6 10/16/2018   PLT 267 10/16/2018   Lab Results  Component Value Date   FERRITIN 907 (H) 09/18/2018   IRON 53 09/18/2018   TIBC 194 (L) 09/18/2018   UIBC 141 09/18/2018   IRONPCTSAT 27 09/18/2018   Lab Results  Component Value Date   RETICCTPCT 0.8 10/16/2018   RBC 4.31 10/16/2018   RBC 4.26 10/16/2018   RETICCTABS 64.6 04/01/2009   No results found for: KPAFRELGTCHN, LAMBDASER, KAPLAMBRATIO No results found for: IGGSERUM, IGA, IGMSERUM No results found for:  Odetta Pink, SPEI   Chemistry      Component Value Date/Time   NA 138 10/16/2018 1039   K 4.4 10/16/2018 1039   CL 100 10/16/2018 1039   CO2 32 10/16/2018 1039   BUN 16 10/16/2018 1039   CREATININE 0.71 10/16/2018 1039   CREATININE 0.91 03/16/2016 1539      Component Value Date/Time   CALCIUM 8.0 (L) 10/16/2018 1039   ALKPHOS 155 (H) 10/16/2018 1039   AST 20 10/16/2018 1039   ALT 22 10/16/2018 1039   BILITOT 0.3 10/16/2018 1039       Impression and Plan: Ms. Tomson is a very pleasant 73 yo caucasian female with iron deficiency anemia secondary to malabsorption and possible GI blood loss with Eliquis.  No Aranesp needed this visit.  We will see what her iron studies show and bring her back in for infusion if needed.  We will see her back in another month.  She will contact our office with any questions or concerns. We can certainly see her sooner if needed.   Laverna Peace, NP 7/30/202012:19 PM

## 2018-10-17 ENCOUNTER — Other Ambulatory Visit: Payer: Self-pay | Admitting: Internal Medicine

## 2018-10-17 LAB — IRON AND TIBC
Iron: 61 ug/dL (ref 41–142)
Saturation Ratios: 29 % (ref 21–57)
TIBC: 206 ug/dL — ABNORMAL LOW (ref 236–444)
UIBC: 145 ug/dL (ref 120–384)

## 2018-10-17 LAB — FERRITIN: Ferritin: 588 ng/mL — ABNORMAL HIGH (ref 11–307)

## 2018-10-17 NOTE — Progress Notes (Signed)
Reviewed and agree with assessment/plan.   Markey Deady, MD [facility] 03/14/2016, 12:24 PM Pager:  [phone]  

## 2018-10-20 ENCOUNTER — Ambulatory Visit: Payer: Self-pay

## 2018-10-20 ENCOUNTER — Encounter

## 2018-10-20 ENCOUNTER — Ambulatory Visit (INDEPENDENT_AMBULATORY_CARE_PROVIDER_SITE_OTHER): Payer: HMO | Admitting: Physical Medicine and Rehabilitation

## 2018-10-20 VITALS — BP 77/45 | HR 67

## 2018-10-20 DIAGNOSIS — M5416 Radiculopathy, lumbar region: Secondary | ICD-10-CM | POA: Diagnosis not present

## 2018-10-20 MED ORDER — BETAMETHASONE SOD PHOS & ACET 6 (3-3) MG/ML IJ SUSP
12.0000 mg | Freq: Once | INTRAMUSCULAR | Status: AC
  Administered 2018-10-20: 12 mg

## 2018-10-20 NOTE — Procedures (Signed)
Lumbosacral Transforaminal Epidural Steroid Injection - Sub-Pedicular Approach with Fluoroscopic Guidance  Patient: [name]      Date of Birth: [birth year] MRN: [id number] PCP: Ann Held, DO      Visit Date: 10/20/2018   Universal Protocol:    Date/Time: 10/20/2018  Consent Given By: the patient  Position: PRONE  Additional Comments: Vital signs were monitored before and after the procedure. Patient was prepped and draped in the usual sterile fashion. The correct patient, procedure, and site was verified.   Injection Procedure Details:  Procedure Site One Meds Administered:  Meds ordered this encounter  Medications  . betamethasone acetate-betamethasone sodium phosphate (CELESTONE) injection 12 mg    Laterality: Bilateral  Location/Site:  L4-L5  Needle size: 22 G  Needle type: Spinal  Needle Placement: Transforaminal  Findings:    -Comments: Excellent flow of contrast along the nerve and into the epidural space.  Procedure Details: After squaring off the end-plates to get a true AP view, the C-arm was positioned so that an oblique view of the foramen as noted above was visualized. The target area is just inferior to the "nose of the scotty dog" or sub pedicular. The soft tissues overlying this structure were infiltrated with 2-3 ml. of 1% Lidocaine without Epinephrine.  The spinal needle was inserted toward the target using a "trajectory" view along the fluoroscope beam.  Under AP and lateral visualization, the needle was advanced so it did not puncture dura and was located close the 6 O'Clock position of the pedical in AP tracterory. Biplanar projections were used to confirm position. Aspiration was confirmed to be negative for CSF and/or blood. A 1-2 ml. volume of Isovue-250 was injected and flow of contrast was noted at each level. Radiographs were obtained for documentation purposes.   After attaining the desired flow of contrast documented above,  a 0.5 to 1.0 ml test dose of 0.25% Marcaine was injected into each respective transforaminal space.  The patient was observed for 90 seconds post injection.  After no sensory deficits were reported, and normal lower extremity motor function was noted,   the above injectate was administered so that equal amounts of the injectate were placed at each foramen (level) into the transforaminal epidural space.   Additional Comments:  The patient tolerated the procedure well Dressing: 2 x 2 sterile gauze and Band-Aid    Post-procedure details: Patient was observed during the procedure. Post-procedure instructions were reviewed.  Patient left the clinic in stable condition.

## 2018-10-20 NOTE — Progress Notes (Signed)
[name] - 73 y.o. female MRN [id number]  Date of birth: [birth year]  Office Visit Note: Visit Date: 10/20/2018 PCP: Ann Held, DO Referred by: Ann Held, *  Subjective: Chief Complaint  Patient presents with  . Lower Back - Pain  . Left Thigh - Pain   HPI: [name] is a 73 y.o. female who comes in today For bilateral L4 transforaminal epidural steroid injection as requested by Dr. Jean Rosenthal.  Patient is having significant left radicular leg pain improved classic L5 distribution.  She has pain thus lower lumbosacral junction.  She has a history of chronic pain and fibromyalgia.  She is really chronic medical problems with heart issues as well as chronic blood loss and iron deficiency anemia.  She is on anticoagulation chronically.  She does have InterStim type device in the pelvic sacral area.  She did have CT myelogram performed and this is reviewed below.  Interesting that she has dynamic listhesis of L4 on L5 with severe stenosis lying prone.  This likely no diagnosed with MRI.  We will complete bilateral L4 transforaminal injection today diagnostically.  ROS Otherwise per HPI.  Assessment & Plan: Visit Diagnoses:  1. Lumbar radiculopathy     Plan: No additional findings.   Meds & Orders:  Meds ordered this encounter  Medications  . betamethasone acetate-betamethasone sodium phosphate (CELESTONE) injection 12 mg    Orders Placed This Encounter  Procedures  . XR C-ARM NO REPORT  . Epidural Steroid injection    Follow-up: Return if symptoms worsen or fail to improve.   Procedures: No procedures performed  Lumbosacral Transforaminal Epidural Steroid Injection - Sub-Pedicular Approach with Fluoroscopic Guidance  Patient: [name]      Date of Birth: 06-14-1945 MRN: [id number] PCP: Ann Held, DO      Visit Date: 10/20/2018   Universal Protocol:    Date/Time: 10/20/2018  Consent Given By: the patient   Position: PRONE  Additional Comments: Vital signs were monitored before and after the procedure. Patient was prepped and draped in the usual sterile fashion. The correct patient, procedure, and site was verified.   Injection Procedure Details:  Procedure Site One Meds Administered:  Meds ordered this encounter  Medications  . betamethasone acetate-betamethasone sodium phosphate (CELESTONE) injection 12 mg    Laterality: Bilateral  Location/Site:  L4-L5  Needle size: 22 G  Needle type: Spinal  Needle Placement: Transforaminal  Findings:    -Comments: Excellent flow of contrast along the nerve and into the epidural space.  Procedure Details: After squaring off the end-plates to get a true AP view, the C-arm was positioned so that an oblique view of the foramen as noted above was visualized. The target area is just inferior to the "nose of the scotty dog" or sub pedicular. The soft tissues overlying this structure were infiltrated with 2-3 ml. of 1% Lidocaine without Epinephrine.  The spinal needle was inserted toward the target using a "trajectory" view along the fluoroscope beam.  Under AP and lateral visualization, the needle was advanced so it did not puncture dura and was located close the 6 O'Clock position of the pedical in AP tracterory. Biplanar projections were used to confirm position. Aspiration was confirmed to be negative for CSF and/or blood. A 1-2 ml. volume of Isovue-250 was injected and flow of contrast was noted at each level. Radiographs were obtained for documentation purposes.   After attaining the desired flow of contrast documented above,  a 0.5 to 1.0 ml test dose of 0.25% Marcaine was injected into each respective transforaminal space.  The patient was observed for 90 seconds post injection.  After no sensory deficits were reported, and normal lower extremity motor function was noted,   the above injectate was administered so that equal amounts of the  injectate were placed at each foramen (level) into the transforaminal epidural space.   Additional Comments:  The patient tolerated the procedure well Dressing: 2 x 2 sterile gauze and Band-Aid    Post-procedure details: Patient was observed during the procedure. Post-procedure instructions were reviewed.  Patient left the clinic in stable condition.    Clinical History: CT LUMBAR MYELOGRAM FINDINGS:  Segmentation: Standard.  Alignment: Unchanged 7 mm anterolisthesis at L4-L5.  Vertebrae: No acute fracture or other focal pathologic process.  Conus medullaris and cauda equina: Conus extends to the L1-L2 level. Conus and cauda equina appear normal.  Paraspinal and other soft tissues: Scarring at the right lung base. Mild central intrahepatic and common bile duct dilatation is likely due to post cholecystectomy state. Aortoiliac atherosclerotic vascular disease.  Disc levels:  T11-T12: Negative.  T12-L1:  Small right paracentral disc protrusion.  No stenosis.  L1-L2:  Small right paracentral disc protrusion.  No stenosis.  L2-L3:  Negative disc.  Ligamentum flavum hypertrophy.  No stenosis.  L3-L4:  Negative disc.  Ligamentum flavum hypertrophy.  No stenosis.  L4-L5: Disc uncovering with diffuse disc bulging. Moderate bilateral facet arthropathy with prominent ligamentum flavum hypertrophy. Moderate spinal canal stenosis. Severe left and mild right lateral recess stenosis. Mild to moderate left and mild right neuroforaminal stenosis.  L5-S1: Small shallow broad-based posterior disc protrusion. Mild bilateral facet arthropathy. No stenosis.  IMPRESSION: 1. 10 mm anterolisthesis at L4-L5 when prone, not significantly changed with dynamic maneuvers, but increased from 7 mm when supine. 2. Severe spinal canal stenosis at L4-L5 when prone, moderate when standing or supine. 3. Severe left lateral recess stenosis at L4-L5 with impingement of the  descending left L4 nerve root.   Electronically Signed   By: Titus Dubin M.D.   On: 09/17/2018 11:25   She reports that she quit smoking about 32 years ago. She has a 20.00 pack-year smoking history. She has never used smokeless tobacco. No results for input(s): HGBA1C, LABURIC in the last 8760 hours.  Objective:  VS:  HT:    WT:   BMI:     BP:(!) 77/45(Pt states BP runs very low on average)  HR:67bpm  TEMP: ( )  RESP:  Physical Exam  Ortho Exam Imaging: Xr C-arm No Report  Result Date: 10/20/2018 Please see Notes tab for imaging impression.   Past Medical/Family/Surgical/Social History: Medications & Allergies reviewed per EMR, new medications updated. Patient Active Problem List   Diagnosis Date Noted  . Educated About Covid-19 Virus Infection 09/07/2018  . Erythropoietin deficiency anemia 06/19/2018  . Swelling of calf 02/04/2018  . Bad odor of urine 12/09/2017  . Iron deficiency anemia due to chronic blood loss 08/14/2017  . Iron malabsorption 08/14/2017  . Hyperlipidemia LDL goal <100 07/08/2017  . Elevated ALT measurement   . Hypotension 06/20/2017  . HLD (hyperlipidemia) 06/20/2017  . CAD (coronary artery disease) 06/20/2017  . Protein-calorie malnutrition, severe ([place]) 06/20/2017  . Generalized weakness   . Weakness 05/30/2017  . Focal neurological deficit 05/30/2017  . Chronic diastolic CHF (congestive heart failure) ([place]) 05/30/2017  . TIA (transient ischemic attack) 05/30/2017  . AKI (acute kidney injury) ([place]) 05/30/2017  .  Abnormal LFTs 05/30/2017  . Bronchiectasis ([place]) 04/22/2017  . Right otitis media 07/17/2016  . Cerumen impaction 07/17/2016  . Osteoporosis 03/16/2016  . Headache 11/20/2014  . Skin infection 05/27/2014  . Insomnia 05/27/2014  . Abdominal wall pain 07/09/2013  . Other dysphagia 02/02/2013  . Platelets decreased ([place]) 01/29/2012  . Vitamin D deficiency 12/13/2011  . Hypokalemia 12/13/2011  . Hypocalcemia 12/12/2011  .  Adhesions of intestine, very dense, with frozen abdomen 12/11/2011  . SBO (small bowel obstruction), recurrent 12/11/2011  . Protein-calorie malnutrition, moderate ([place]) 12/11/2011  . Oxygen dependent   . Hx of gastric ulcer   . UTI (urinary tract infection) 10/15/2011  . Allergic conjunctivitis 10/15/2011  . Degenerative arthritis of hip 08/03/2011  . B12 deficiency 07/02/2011  . Bilateral leg weakness 01/16/2011  . Anemia, chronic disease 09/14/2010  . Recurrent pneumonia 08/11/2010  . CRT- [employer] 06/26/2010  . DYSURIA 05/19/2010  . Chronic hypoxemic respiratory failure ([place]) 02/21/2010  . DEHYDRATION 01/06/2010  . GASTROENTERITIS 01/06/2010  . Congestive dilated cardiomyopathy ([place]) 11/17/2009  . LBBB 11/17/2009  . SYSTOLIC HEART FAILURE, CHRONIC 11/17/2009  . GERD (gastroesophageal reflux disease) 02/17/2009  . NONSPECIFIC ABN FINDNG RAD&OTH EXAM BILARY TRCT 09/03/2008  . COPD with emphysema ([place]) 06/28/2008  . DIVERTICULOSIS, COLON 06/03/2008  . GASTRITIS 06/02/2008  . Atrial fibrillation/flutter 12/08/2007  . GASTROPARESIS 08/07/2007  . NAUSEA, CHRONIC 08/07/2007  . Abdominal pain 08/07/2007  . SYNCOPE 02/17/2007  . RESTLESS LEG SYNDROME 08/01/2006  . Chronic pain syndrome 08/01/2006  . OSTEOARTHRITIS 07/31/2006  . Degenerative disc disease, lumbar 07/31/2006  . FIBROMYALGIA 07/31/2006  . CHRONIC FATIGUE SYNDROME 07/31/2006  . URINARY INCONTINENCE 07/31/2006  . ANEMIA-IRON DEFICIENCY 05/01/2006  . Essential hypertension 05/01/2006   Past Medical History:  Diagnosis Date  . Anemia   . Arthritis    right hip  . Atrial fibrillation ([place])   . CAD (coronary artery disease)    mild disease per cath in 2011  . CHF (congestive heart failure) ([place])   . COPD (chronic obstructive pulmonary disease) ([facility])    continuous O2- 2l  . Diverticulitis   . Elbow fracture, left aug 2012  . Erythropoietin deficiency anemia 06/19/2018  . Fibromyalgia    COUPLE OF TIMES A YEAR-PT  HAS EPISODES OF CONFUSION-USUALLY INVOLVES DAY/NIGHT REVERSAL AND EPISODES OF TWITCHING AND FALLS--SEES DR. Jacelyn Grip - NEUROLOGIST-LAST SEEN 07/10/11  . Gastroparesis   . GERD (gastroesophageal reflux disease)   . H/O: GI bleed    from Pradaxa  . Hepatitis    hx of in high school   . HTN (hypertension)    under control; has been on med. x "years"  . Hx MRSA infection   . Hx of gastric ulcer   . Hx of stress fx aug 2011   right hip   . Iron malabsorption 08/14/2017  . LBBB (left bundle branch block)   . Oxygen dependent    2 liters via nasal cannula at all times  . Pacemaker    CRT therapy; followed by Dr. Caryl Comes  . Pleural effusion     s/p right thoracentesis 03/09  . Pneumonia - 03/09, 12/09, 02/10, 06/10   MOST RECENT FEB 2013  . Primary dilated cardiomyopathy ([facility])    EF 45 to 50% per echo in Jan 2012  . RLS (restless legs syndrome)   . Silent aspiration   . Small bowel obstruction ([place])   . Urinary incontinence     -INTERSTIM IMPLANT NOT FUNCTIONING PER PT  . Venous embolism  and thrombosis of subclavian vein [facility])    after pacemaker insertion Oct 2011  . Vitamin B12 deficiency    Family History  Problem Relation Age of Onset  . Heart disease Father   . Stroke Mother   . Heart disease Mother   . Heart disease Brother   . Breast cancer Maternal Aunt   . Heart disease Paternal Aunt   . Diabetes Paternal Grandfather   . Colon cancer Neg Hx    Past Surgical History:  Procedure Laterality Date  . APPENDECTOMY    . CARDIAC CATHETERIZATION  04/08/2006, 11/16/2009  . CHOLECYSTECTOMY N/A 06/27/2017   Procedure: OPEN CHOLECYSTECTOMY;  Surgeon: Ralene Ok, MD;  Location: [place];  Service: General;  Laterality: N/A;  . COLECTOMY     Intestinal resection (5times)  . COLECTOMY  02/04/2000   ex. lap., intra-abd. subtotal colectomy with ileosigmoid colon anastomosies and lysis of adhesions  . CYSTOSCOPY  11/11/2008  . CYSTOSCOPY W/ RETROGRADES  11/11/2008   right  . CYSTOSCOPY  WITH INJECTION  04/30/2006   transurethral collagen injection; incision vaginal stenosis  . ERCP N/A 06/23/2017   Procedure: ENDOSCOPIC RETROGRADE CHOLANGIOPANCREATOGRAPHY (ERCP);  Surgeon: Carol Ada, MD;  Location: [place];  Service: Endoscopy;  Laterality: N/A;  . ERCP N/A 06/24/2017   Procedure: ENDOSCOPIC RETROGRADE CHOLANGIOPANCREATOGRAPHY (ERCP);  Surgeon: [name], MD;  Location: [facility];  Service: Endoscopy;  Laterality: N/A;  . ESOPHAGOGASTRODUODENOSCOPY N/A 02/02/2013   Procedure: ESOPHAGOGASTRODUODENOSCOPY (EGD);  Surgeon: Lafayette Dragon, MD;  Location: [facility];  Service: Endoscopy;  Laterality: N/A;  . EXPLORATORY LAPAROTOMY  04/27/2009   lysis of adhesions, gastrostomy tube  . GASTROCUTANEOUS FISTULA CLOSURE    . INTERSTIM IMPLANT PLACEMENT  05/28/2006 - stage I   06/05/2006 - stage II  . INTERSTIM IMPLANT PLACEMENT  02/05/2012   Procedure: [name] IMPLANT FIRST STAGE;  Surgeon: Reece Packer, MD;  Location: [facility];  Service: Urology;  Laterality: N/A;  Replacement of Interstim Lead     . INTERSTIM IMPLANT REVISION  03/06/2011   Procedure: REVISION OF [name];  Surgeon: Reece Packer, MD;  Location: [facility];  Service: Urology;  Laterality: N/A;  Replacement of Neurostimulator  . INTERSTIM IMPLANT REVISION  10/23/2007  . IR IMAGING GUIDED PORT INSERTION  10/14/2017  . PACEMAKER PLACEMENT  12/21/2009  . Peg removed     with complications  . PORT-A-CATH REMOVAL  07/27/2011   Procedure: REMOVAL PORT-A-CATH;  Surgeon: Rolm Bookbinder, MD;  Location: [place];  Service: General;  Laterality: N/A;  . PORTACATH PLACEMENT  12/16/2009  . SAVORY DILATION N/A 02/02/2013   Procedure: SAVORY DILATION;  Surgeon: Lafayette Dragon, MD;  Location: [facility];  Service: Endoscopy;  Laterality: N/A;  . SMALL INTESTINE SURGERY  05/20/2001   ex. lap., resection of small bowel stricture; gastrostomy; insertion central line  . TONSILLECTOMY  age 74  . TOTAL  ABDOMINAL HYSTERECTOMY     complete  . TOTAL HIP ARTHROPLASTY  08/03/2011   Procedure: TOTAL HIP ARTHROPLASTY ANTERIOR APPROACH;  Surgeon: Mcarthur Rossetti, MD;  Location: [facility];  Service: Orthopedics;  Laterality: Right;   Social History   Occupational History  . Occupation: disabled    Employer: RETIRED  Tobacco Use  . Smoking status: Former Smoker    Packs/day: 1.00    Years: 20.00    Pack years: 20.00    Quit date: 03/19/1986    Years since quitting: 32.6  . Smokeless tobacco: Never Used  Substance and Sexual  Activity  . Alcohol use: No    Alcohol/week: 0.0 standard drinks  . Drug use: No  . Sexual activity: Yes

## 2018-10-20 NOTE — Progress Notes (Signed)
 .  Numeric Pain Rating Scale and Functional Assessment Average Pain 5   In the last MONTH (on 0-10 scale) has pain interfered with the following?  1. General activity like being  able to carry out your everyday physical activities such as walking, climbing stairs, carrying groceries, or moving a chair?  Rating(6)   +Driver, +BT(eliquis, ok for inj), -Dye Allergies.

## 2018-10-23 NOTE — Progress Notes (Signed)
Remote pacemaker transmission.   

## 2018-10-24 ENCOUNTER — Other Ambulatory Visit: Payer: Self-pay | Admitting: Family Medicine

## 2018-10-24 DIAGNOSIS — R52 Pain, unspecified: Secondary | ICD-10-CM

## 2018-10-24 MED ORDER — OXYCODONE-ACETAMINOPHEN 10-325 MG PO TABS: 1.0000 | ORAL_TABLET | ORAL | 0 refills | Status: DC | PRN

## 2018-10-24 NOTE — Telephone Encounter (Signed)
Oxycodone refill.   Last OV: 08/04/2018 Last Fill: 09/29/2018 #120 and 0RF Pt sig: 1 tab q4h prn UDS: 03/07/2017 Low risk

## 2018-10-24 NOTE — Telephone Encounter (Signed)
Copied from [place] [id number]. Topic: Quick Communication - Rx Refill/Question >> Oct 24, 2018  8:19 AM Nils Flack wrote: Medication:oxyCODONE-acetaminophen (PERCOCET) 10-325 MG tablet  Has the patient contacted their pharmacy? No. (Agent: If no, request that the patient contact the pharmacy for the refill.) (Agent: If yes, when and what did the pharmacy advise?)  Preferred Pharmacy (with phone number or street name): [facility] [address]  Agent: Please be advised that RX refills may take up to 3 business days. We ask that you follow-up with your pharmacy.

## 2018-10-27 ENCOUNTER — Telehealth: Payer: Self-pay | Admitting: Pulmonary Disease

## 2018-10-27 ENCOUNTER — Other Ambulatory Visit: Payer: Self-pay

## 2018-10-27 ENCOUNTER — Encounter: Payer: Self-pay | Admitting: Primary Care

## 2018-10-27 ENCOUNTER — Ambulatory Visit (INDEPENDENT_AMBULATORY_CARE_PROVIDER_SITE_OTHER): Payer: HMO | Admitting: Primary Care

## 2018-10-27 DIAGNOSIS — J471 Bronchiectasis with (acute) exacerbation: Secondary | ICD-10-CM

## 2018-10-27 MED ORDER — AMOXICILLIN-POT CLAVULANATE 875-125 MG PO TABS: 1.0000 | ORAL_TABLET | Freq: Two times a day (BID) | ORAL | 0 refills | Status: DC

## 2018-10-27 NOTE — Telephone Encounter (Signed)
Spoke with the pt She is c/o increased cough over the past several days with soreness in her right lung  She states she swallows her sputum so unsure if purulent  She has not had any fever  She has had minimal wheezing She denies any chills, loss of taste or smell, n/v/d, joint pain, CP Video visit with Eustaquio Maize today was scheduled

## 2018-10-27 NOTE — Progress Notes (Signed)
Virtual Visit via Telephone Note  I connected with [name] on 10/27/18 at 11:00 AM EDT by telephone and verified that I am speaking with the correct person using two identifiers.  Location: Patient: Home Provider: Office   I discussed the limitations, risks, security and privacy concerns of performing an evaluation and management service by telephone and the availability of in person appointments. I also discussed with the patient that there may be a patient responsible charge related to this service. The patient expressed understanding and agreed to proceed.   History of Present Illness: 73 year old female, former smoker. PMH significant for bronchiectasis, COPD with emphysema, recurrent pneumonia, chronic respiratory failure. Patient of Dr. Halford Chessman, last seen on 09/25/18. Maintained on flutter valve and mucinex prn. Spiriva changed to incruse d/t insurance requirements.   10/27/2018 Patient called office today for televisit with complaints of cough and "sore lungs". For the last 3-4 days she has had a productive cough, occasional wheezing. Some discomfort in RLL. No shortness or chest tightness. Using her rescue inhaler twice a day with some relief for short of times. She has not tried using her flutter valve and is not currently taking mucinex. Patient declined CXR today. Afebrile.   Observations/Objective:  - No shortness of breath, wheezing or cough   Assessment and Plan:  Bronchiectasis with acute exacerbation - Rx Augmentin 1 tab x 7 days - Advised patient to start using flutter valve - Mucinex twice daily  - If not better needs CXR   Follow Up Instructions:  As needed if symptoms do not improve or worsen 6 months with Dr. Halford Chessman    I discussed the assessment and treatment plan with the patient. The patient was provided an opportunity to ask questions and all were answered. The patient agreed with the plan and demonstrated an understanding of the instructions.   The patient  was advised to call back or seek an in-person evaluation if the symptoms worsen or if the condition fails to improve as anticipated.  I provided 18 minutes of non-face-to-face time during this encounter.   Martyn Ehrich, NP

## 2018-10-27 NOTE — Patient Instructions (Addendum)
Recommendations: Augmentin 1 tab twice daily x 7 days  Take mucinex twice  Use flutter valve three times a day   Follow-up: Call/Return if symptoms so not improve or worsen  6 months with Dr. Halford Chessman

## 2018-11-03 ENCOUNTER — Other Ambulatory Visit: Payer: Self-pay | Admitting: Family Medicine

## 2018-11-03 ENCOUNTER — Ambulatory Visit: Payer: HMO | Admitting: Family Medicine

## 2018-11-03 ENCOUNTER — Other Ambulatory Visit: Payer: Self-pay | Admitting: Pharmacist

## 2018-11-03 DIAGNOSIS — R52 Pain, unspecified: Secondary | ICD-10-CM

## 2018-11-03 MED ORDER — GABAPENTIN 600 MG PO TABS: ORAL_TABLET | ORAL | 1 refills | Status: DC

## 2018-11-03 NOTE — Telephone Encounter (Signed)
Medication Refill - Medication: gabapentin (NEURONTIN) 600 MG tablet  Has the patient contacted their pharmacy? No. (Agent: If no, request that the patient contact the pharmacy for the refill.) (Agent: If yes, when and what did the pharmacy advise?)  Preferred Pharmacy (with phone number or street name):  [facility], [place] [phone] (Phone) [phone] (Fax)     Agent: Please be advised that RX refills may take up to 3 business days. We ask that you follow-up with your pharmacy.

## 2018-11-03 NOTE — Patient Outreach (Signed)
[facility]) Care Management  11/03/2018  [name] 07/27/1945 [id number]   Patient was called to follow up on medication assistance and medication management.  Patient received Incruse from [facility] Patient Assistance Program and communicated understanding on how to reorder.   HIPAA identifiers were obtained.  Patient reported being in 8/10 pain today in her mid back.  She said she was taking her pain medication (oxycodone-acetaminophen) without relief.  Non-Pharmacologic pain relief like massage and warm compress were discussed but patient said she felt too bad today to try anything.  She reported no new medication changes:  Medications Reviewed Today    Reviewed by Martyn Ehrich, NP (Nurse Practitioner) on 10/27/18 at 1128  Med List Status: <None>  Medication Order Taking? Sig Documenting Provider Last Dose Status Informant  acetaminophen (TYLENOL) 325 MG tablet [id number] Yes Take 2 tablets (650 mg total) by mouth every 4 (four) hours as needed. Saverio Danker, PA-C Taking Active   albuterol (VENTOLIN HFA) 108 (90 Base) MCG/ACT inhaler [id number] Yes Inhale 2 puffs into the lungs every 6 (six) hours as needed for wheezing or shortness of breath. Lauraine Rinne, NP Taking Active   amLODipine (NORVASC) 2.5 MG tablet [id number] Yes Take 1 tablet (2.5 mg total) by mouth daily. Minus Breeding, MD Taking Active   amoxicillin-clavulanate (AUGMENTIN) 875-125 MG tablet [id number]  Take 1 tablet by mouth 2 (two) times daily. Martyn Ehrich, NP  Active   apixaban (ELIQUIS) 5 MG TABS tablet [id number] Yes Take 1 tablet (5 mg total) by mouth 2 (two) times daily. Roma Schanz R, DO Taking Active   atorvastatin (LIPITOR) 20 MG tablet [id number] Yes Take 1 tablet (20 mg total) by mouth daily at 6 PM. Carollee Herter, Alferd Apa, DO Taking Active   carvedilol (COREG) 25 MG tablet [id number] Yes TAKE 1 TABLET BY MOUTH TWICE DAILY Hochrein, James, MD Taking Active   denosumab  (PROLIA) 60 MG/ML SOSY injection [id number] Yes Inject 60 mg into the skin every 6 (six) months. [provider] Taking Active   furosemide (LASIX) 40 MG tablet [id number] Yes TAKE ONE TABLET (40mg ) BY MOUTH IN THE [name], MD Taking Active   gabapentin (NEURONTIN) 600 MG tablet [id number] Yes Take 1 tablet by mouth 4 times daily Ann Held, DO Taking Active         Discontinued 06/05/11 1433 (Error)   lidocaine-prilocaine (EMLA) cream [id number] Yes Apply 1 application topically as needed. Volanda Napoleon, MD Taking Active   nitrofurantoin (MACRODANTIN) 100 MG capsule [id number] Yes Take 100 mg by mouth daily. [provider] Taking Active   nitroGLYCERIN (NITROSTAT) 0.4 MG SL tablet [id number] Yes Place 1 tablet (0.4 mg total) under the tongue every 5 (five) minutes as needed. CHEST PAIN Deboraha Sprang, MD Taking Active Self           Med Note Gar Ponto   Wed May 14, 2018 10:10 AM)    oxyCODONE-acetaminophen (PERCOCET) 10-325 MG tablet [id number] Yes Take 1 tablet by mouth every 4 (four) hours as needed. PAIN Ann Held, DO Taking Active   pantoprazole (PROTONIX) 40 MG tablet [id number] Yes Take 1 tablet by mouth twice daily Gatha Mayer, MD Taking Active   promethazine (PHENERGAN) 25 MG tablet [id number] Yes TAKE ONE TABLET BY MOUTH EVERY 8 HOURS AS NEEDED FOR NAUSEA Gatha Mayer, MD Taking Active   tiZANidine (ZANAFLEX) 4 MG tablet [id number] Yes TAKE 1 TABLET BY MOUTH THREE  TIMES DAILY AS NEEDED FOR  MUSCLE  SPAMS Ann Held, DO Taking Active   triamcinolone ointment (KENALOG) 0.1 % [id number] Yes APPLY TO SKIN TWICE DAILY FOR 14 DAYS [provider] Taking Active   umeclidinium bromide (INCRUSE ELLIPTA) 62.5 MCG/INH AEPB [id number] Yes Inhale 1 puff into the lungs daily. Lauraine Rinne, NP Taking Active   zolpidem (AMBIEN) 5 MG tablet [id number] Yes TAKE 1 TABLET BY MOUTH EVERY DAY AT BEDTIME AS NEEDED Ann Held, DO Taking Active          Plan: Call patient back in 3-4 months as part of the [place] quarterly follow up.   Elayne Guerin, PharmD, [place] Clinical Pharmacist [phone]

## 2018-11-04 ENCOUNTER — Ambulatory Visit: Payer: Self-pay | Admitting: Pharmacist

## 2018-11-06 ENCOUNTER — Encounter: Payer: Self-pay | Admitting: Family Medicine

## 2018-11-06 ENCOUNTER — Other Ambulatory Visit: Payer: Self-pay

## 2018-11-06 ENCOUNTER — Ambulatory Visit (INDEPENDENT_AMBULATORY_CARE_PROVIDER_SITE_OTHER): Payer: HMO | Admitting: Family Medicine

## 2018-11-06 VITALS — BP 110/58 | HR 65 | Temp 97.4°F | Resp 18 | Ht 60.0 in | Wt 87.0 lb

## 2018-11-06 DIAGNOSIS — M169 Osteoarthritis of hip, unspecified: Secondary | ICD-10-CM | POA: Diagnosis not present

## 2018-11-06 DIAGNOSIS — J439 Emphysema, unspecified: Secondary | ICD-10-CM | POA: Diagnosis not present

## 2018-11-06 DIAGNOSIS — Z79899 Other long term (current) drug therapy: Secondary | ICD-10-CM

## 2018-11-06 DIAGNOSIS — Z23 Encounter for immunization: Secondary | ICD-10-CM | POA: Diagnosis not present

## 2018-11-06 DIAGNOSIS — E785 Hyperlipidemia, unspecified: Secondary | ICD-10-CM

## 2018-11-06 DIAGNOSIS — I480 Paroxysmal atrial fibrillation: Secondary | ICD-10-CM

## 2018-11-06 DIAGNOSIS — M5136 Other intervertebral disc degeneration, lumbar region: Secondary | ICD-10-CM

## 2018-11-06 DIAGNOSIS — M797 Fibromyalgia: Secondary | ICD-10-CM | POA: Diagnosis not present

## 2018-11-06 DIAGNOSIS — G894 Chronic pain syndrome: Secondary | ICD-10-CM | POA: Diagnosis not present

## 2018-11-06 DIAGNOSIS — I10 Essential (primary) hypertension: Secondary | ICD-10-CM | POA: Diagnosis not present

## 2018-11-06 NOTE — Patient Instructions (Signed)
Dyslipidemia Dyslipidemia is an imbalance of waxy, fat-like substances (lipids) in the blood. The body needs lipids in small amounts. Dyslipidemia often involves a high level of cholesterol or triglycerides, which are types of lipids. Common forms of dyslipidemia include:  High levels of LDL cholesterol. LDL is the type of cholesterol that causes fatty deposits (plaques) to build up in the blood vessels that carry blood away from your heart (arteries).  Low levels of HDL cholesterol. HDL cholesterol is the type of cholesterol that protects against heart disease. High levels of HDL remove the LDL buildup from arteries.  High levels of triglycerides. Triglycerides are a fatty substance in the blood that is linked to a buildup of plaques in the arteries. What are the causes? Primary dyslipidemia is caused by changes (mutations) in genes that are passed down through families (inherited). These mutations cause several types of dyslipidemia. Secondary dyslipidemia is caused by lifestyle choices and diseases that lead to dyslipidemia, such as:  Eating a diet that is high in animal fat.  Not getting enough exercise.  Having diabetes, kidney disease, liver disease, or thyroid disease.  Drinking large amounts of alcohol.  Using certain medicines. What increases the risk? You are more likely to develop this condition if you are an older man or if you are a woman who has gone through menopause. Other risk factors include:  Having a family history of dyslipidemia.  Taking certain medicines, including birth control pills, steroids, some diuretics, and beta-blockers.  Smoking cigarettes.  Eating a high-fat diet.  Having certain medical conditions such as diabetes, polycystic ovary syndrome (PCOS), kidney disease, liver disease, or hypothyroidism.  Not exercising regularly.  Being overweight or obese with too much belly fat. What are the signs or symptoms? In most cases, dyslipidemia does not  usually cause any symptoms. In severe cases, very high lipid levels can cause:  Fatty bumps under the skin (xanthomas).  White or gray ring around the black center (pupil) of the eye. Very high triglyceride levels can cause inflammation of the pancreas (pancreatitis). How is this diagnosed? Your health care provider may diagnose dyslipidemia based on a routine blood test (fasting blood test). Because most people do not have symptoms of the condition, this blood testing (lipid profile) is done on adults age 49 and older and is repeated every 5 years. This test checks:  Total cholesterol. This measures the total amount of cholesterol in your blood, including LDL cholesterol, HDL cholesterol, and triglycerides. A healthy number is below 200.  LDL cholesterol. The target number for LDL cholesterol is different for each person, depending on individual risk factors. Ask your health care provider what your LDL cholesterol should be.  HDL cholesterol. An HDL level of 60 or higher is best because it helps to protect against heart disease. A number below 75 for men or below 21 for women increases the risk for heart disease.  Triglycerides. A healthy triglyceride number is below 150. If your lipid profile is abnormal, your health care provider may do other blood tests. How is this treated? Treatment depends on the type of dyslipidemia that you have and your other risk factors for heart disease and stroke. Your health care provider will have a target range for your lipid levels based on this information. For many people, this condition may be treated by lifestyle changes, such as diet and exercise. Your health care provider may recommend that you:  Get regular exercise.  Make changes to your diet.  Quit smoking if you  smoke. If diet changes and exercise do not help you reach your goals, your health care provider may also prescribe medicine to lower lipids. The most commonly prescribed type of medicine  lowers your LDL cholesterol (statin drug). If you have a high triglyceride level, your provider may prescribe another type of drug (fibrate) or an omega-3 fish oil supplement, or both. Follow these instructions at home:  Eating and drinking  Follow instructions from your health care provider or dietitian about eating or drinking restrictions.  Eat a healthy diet as told by your health care provider. This can help you reach and maintain a healthy weight, lower your LDL cholesterol, and raise your HDL cholesterol. This may include: ? Limiting your calories, if you are overweight. ? Eating more fruits, vegetables, whole grains, fish, and lean meats. ? Limiting saturated fat, trans fat, and cholesterol.  If you drink alcohol: ? Limit how much you use. ? Be aware of how much alcohol is in your drink. In the [place] one drink equals one 12 oz bottle of beer (355 mL), one 5 oz glass of wine (148 mL), or one 1 oz glass of hard liquor (44 mL).  Do not drink alcohol if: ? Your health care provider tells you not to drink. ? You are pregnant, may be pregnant, or are planning to become pregnant. Activity  Get regular exercise. Start an exercise and strength training program as told by your health care provider. Ask your health care provider what activities are safe for you. Your health care provider may recommend: ? 30 minutes of aerobic activity 4-6 days a week. Brisk walking is an example of aerobic activity. ? Strength training 2 days a week. General instructions  Do not use any products that contain nicotine or tobacco, such as cigarettes, e-cigarettes, and chewing tobacco. If you need help quitting, ask your health care provider.  Take over-the-counter and prescription medicines only as told by your health care provider. This includes supplements.  Keep all follow-up visits as told by your health care provider. Contact a health care provider if:  You are: ? Having trouble sticking to your  exercise or diet plan. ? Struggling to quit smoking or control your use of alcohol. Summary  Dyslipidemia often involves a high level of cholesterol or triglycerides, which are types of lipids.  Treatment depends on the type of dyslipidemia that you have and your other risk factors for heart disease and stroke.  For many people, treatment starts with lifestyle changes, such as diet and exercise.  Your health care provider may prescribe medicine to lower lipids. This information is not intended to replace advice given to you by your health care provider. Make sure you discuss any questions you have with your health care provider. Document Released: 03/10/2013 Document Revised: 10/28/2017 Document Reviewed: 10/04/2017 Elsevier Patient Education  [place].

## 2018-11-06 NOTE — Progress Notes (Signed)
Patient ID: [name], female    DOB: 1945-06-04  Age: 73 y.o. MRN: [id number]    Subjective:  Subjective  HPI [name] presents for f/u cho, bp and chronic pain managmemt.  Pain meds are helping --- pt has no new complaints.    Review of Systems  Constitutional: Negative for appetite change, diaphoresis, fatigue and unexpected weight change.  Eyes: Negative for pain, redness and visual disturbance.  Respiratory: Negative for cough, chest tightness, shortness of breath and wheezing.   Cardiovascular: Negative for chest pain, palpitations and leg swelling.  Endocrine: Negative for cold intolerance, heat intolerance, polydipsia, polyphagia and polyuria.  Genitourinary: Negative for difficulty urinating, dysuria and frequency.  Neurological: Negative for dizziness, light-headedness, numbness and headaches.    History Past Medical History:  Diagnosis Date   Anemia    Arthritis    right hip   Atrial fibrillation ([place])    CAD (coronary artery disease)    mild disease per cath in 2011   CHF (congestive heart failure) (HCC)    COPD (chronic obstructive pulmonary disease) (HCC)    continuous O2- 2l   Diverticulitis    Elbow fracture, left aug 2012   Erythropoietin deficiency anemia 06/19/2018   Fibromyalgia    COUPLE OF TIMES A YEAR-PT HAS EPISODES OF CONFUSION-USUALLY INVOLVES DAY/NIGHT REVERSAL AND EPISODES OF TWITCHING AND FALLS--SEES DR. Jacelyn Grip - NEUROLOGIST-LAST SEEN 07/10/11   Gastroparesis    GERD (gastroesophageal reflux disease)    H/O: GI bleed    from Pradaxa   Hepatitis    hx of in high school    HTN (hypertension)    under control; has been on med. x "years"   Hx MRSA infection    Hx of gastric ulcer    Hx of stress fx aug 2011   right hip    Iron malabsorption 08/14/2017   LBBB (left bundle branch block)    Oxygen dependent    2 liters via nasal cannula at all times   Pacemaker    CRT therapy; followed by Dr. Caryl Comes   Pleural  effusion     s/p right thoracentesis 03/09   Pneumonia - 03/09, 12/09, 02/10, 06/10   MOST RECENT FEB 2013   Primary dilated cardiomyopathy ([place])    EF 45 to 50% per echo in Jan 2012   RLS (restless legs syndrome)    Silent aspiration    Small bowel obstruction (HCC)    Urinary incontinence     -INTERSTIM IMPLANT NOT FUNCTIONING PER PT   Venous embolism and thrombosis of subclavian vein ([place])    after pacemaker insertion Oct 2011   Vitamin B12 deficiency     [name] has a past surgical history that includes Appendectomy; pacemaker placement (12/21/2009); Peg removed; Tonsillectomy (age 51); Interstim implant revision (03/06/2011); Portacath placement (12/16/2009); Total abdominal hysterectomy; Interstim Implant placement (05/28/2006 - stage I); Interstim implant revision (10/23/2007); Cystoscopy with injection (04/30/2006); Small intestine surgery (05/20/2001); Cardiac catheterization (04/08/2006, 11/16/2009); Cystoscopy (11/11/2008); Cystoscopy w/ retrogrades (11/11/2008); Colectomy; Colectomy (02/04/2000); Gastrocutaneous fistula closure; Exploratory laparotomy (04/27/2009); Port-a-cath removal (07/27/2011); Total hip arthroplasty (08/03/2011); Interstim Implant placement (02/05/2012); Esophagogastroduodenoscopy (N/A, 02/02/2013); Savory dilation (N/A, 02/02/2013); ERCP (N/A, 06/23/2017); ERCP (N/A, 06/24/2017); Cholecystectomy (N/A, 06/27/2017); and IR IMAGING GUIDED PORT INSERTION (10/14/2017).   [name] family history includes Breast cancer in [name] maternal aunt; Diabetes in [name] paternal grandfather; Heart disease in [name] brother, father, mother, and paternal aunt; Stroke in [name] mother.[name] reports that [name] quit smoking about 32 years ago. [name] has  a 20.00 pack-year smoking history. [name] has never used smokeless tobacco. [name] reports that [name] does not drink alcohol or use drugs.  Current Outpatient Medications on File Prior to Visit  Medication Sig Dispense Refill   acetaminophen (TYLENOL) 325 MG tablet Take 2  tablets (650 mg total) by mouth every 4 (four) hours as needed.     albuterol (VENTOLIN HFA) 108 (90 Base) MCG/ACT inhaler Inhale 2 puffs into the lungs every 6 (six) hours as needed for wheezing or shortness of breath. 6.7 g 12   amLODipine (NORVASC) 2.5 MG tablet Take 1 tablet (2.5 mg total) by mouth daily. 90 tablet 3   apixaban (ELIQUIS) 5 MG TABS tablet Take 1 tablet (5 mg total) by mouth 2 (two) times daily. 180 tablet 0   atorvastatin (LIPITOR) 20 MG tablet Take 1 tablet (20 mg total) by mouth daily at 6 PM. 90 tablet 1   carvedilol (COREG) 25 MG tablet TAKE 1 TABLET BY MOUTH TWICE DAILY 180 tablet 3   denosumab (PROLIA) 60 MG/ML SOSY injection Inject 60 mg into the skin every 6 (six) months.     furosemide (LASIX) 40 MG tablet TAKE ONE TABLET (40mg ) BY MOUTH IN THE MORNING 90 tablet 3   gabapentin (NEURONTIN) 600 MG tablet Take 1 tablet by mouth 4 times daily 120 tablet 1   lidocaine-prilocaine (EMLA) cream Apply 1 application topically as needed. 30 g 6   nitrofurantoin (MACRODANTIN) 100 MG capsule Take 100 mg by mouth daily.     nitroGLYCERIN (NITROSTAT) 0.4 MG SL tablet Place 1 tablet (0.4 mg total) under the tongue every 5 (five) minutes as needed. CHEST PAIN 25 tablet 5   oxyCODONE-acetaminophen (PERCOCET) 10-325 MG tablet Take 1 tablet by mouth every 4 (four) hours as needed. PAIN 120 tablet 0   pantoprazole (PROTONIX) 40 MG tablet Take 1 tablet by mouth twice daily 180 tablet 0   promethazine (PHENERGAN) 25 MG tablet TAKE ONE TABLET BY MOUTH EVERY 8 HOURS AS NEEDED FOR NAUSEA 40 tablet 2   tiZANidine (ZANAFLEX) 4 MG tablet TAKE 1 TABLET BY MOUTH THREE TIMES DAILY AS NEEDED FOR  MUSCLE  SPAMS 90 tablet 0   triamcinolone ointment (KENALOG) 0.1 % APPLY TO SKIN TWICE DAILY FOR 14 DAYS     umeclidinium bromide (INCRUSE ELLIPTA) 62.5 MCG/INH AEPB Inhale 1 puff into the lungs daily. 1 each 12   zolpidem (AMBIEN) 5 MG tablet TAKE 1 TABLET BY MOUTH EVERY DAY AT BEDTIME AS  NEEDED 30 tablet 0   [DISCONTINUED] iron dextran complex (INFED) 50 MG/ML injection Please give infed infusion (no test dose needed) over 4 hours per pharmacy calculated dose. Ht: 5'2 Wt: 99 pounds Hgb: 12 1 mL 0   No current facility-administered medications on file prior to visit.      Objective:  Objective  Physical Exam Vitals signs and nursing note reviewed.  Constitutional:      Appearance: [name] is well-developed.  HENT:     Head: Normocephalic and atraumatic.  Eyes:     Conjunctiva/sclera: Conjunctivae normal.  Neck:     Musculoskeletal: Normal range of motion and neck supple.     Thyroid: No thyromegaly.     Vascular: No carotid bruit or JVD.  Cardiovascular:     Rate and Rhythm: Normal rate and regular rhythm.     Heart sounds: Normal heart sounds. No murmur.  Pulmonary:     Effort: Pulmonary effort is normal. No respiratory distress.     Breath sounds: Normal  breath sounds. No wheezing or rales.  Chest:     Chest wall: No tenderness.  Neurological:     Mental Status: [name] is alert and oriented to person, place, and time.    BP (!) 110/58 (BP Location: Right Arm, Patient Position: Sitting, Cuff Size: Normal)    Pulse 65    Temp (!) 97.4 F (36.3 C) (Temporal)    Resp 18    Ht 5' (1.524 m)    Wt 87 lb (39.5 kg)    SpO2 100%    BMI 16.99 kg/m  Wt Readings from Last 3 Encounters:  11/06/18 87 lb (39.5 kg)  10/16/18 85 lb (38.6 kg)  09/25/18 88 lb 3.2 oz (40 kg)     Lab Results  Component Value Date   WBC 8.6 10/16/2018   HGB 11.8 (L) 10/16/2018   HCT 39.5 10/16/2018   PLT 267 10/16/2018   GLUCOSE 105 (H) 10/16/2018   CHOL 112 08/18/2018   TRIG 107.0 08/18/2018   HDL 36.80 (L) 08/18/2018   LDLCALC 53 08/18/2018   ALT 22 10/16/2018   AST 20 10/16/2018   NA 138 10/16/2018   K 4.4 10/16/2018   CL 100 10/16/2018   CREATININE 0.71 10/16/2018   BUN 16 10/16/2018   CO2 32 10/16/2018   TSH 4.43 06/26/2010   INR 0.98 10/14/2017   HGBA1C 5.2 05/31/2017     Ct Lumbar Spine W Contrast  Result Date: 09/17/2018 CLINICAL DATA:  Chronic left-sided low back pain radiating to the left hip. No leg pain. No prior surgery. EXAM: LUMBAR MYELOGRAM CT LUMBAR MYELOGRAM FLUOROSCOPY TIME:  Radiation Exposure Index (as provided by the fluoroscopic device): 14.3 mGy Fluoroscopy Time:  1 minutes, 19 seconds Number of Acquired Images:  15 PROCEDURE: After thorough discussion of risks and benefits of the procedure including bleeding, infection, injury to nerves, blood vessels, adjacent structures as well as headache and CSF leak, written and oral informed consent was obtained. Consent was obtained by Dr. Fabiola Backer. Time out form was completed. Patient was positioned prone on the fluoroscopy table. Local anesthesia was provided with 1% lidocaine without epinephrine after prepped and draped in the usual sterile fashion. Puncture was performed at L3-L4 using a 3 1/2 inch 22-gauge spinal needle via right interlaminar approach. Using a single pass through the dura, the needle was placed within the thecal sac, with return of clear CSF. 15 mL of Isovue M-200 was injected into the thecal sac, with normal opacification of the nerve roots and cauda equina consistent with free flow within the subarachnoid space. I personally performed the lumbar puncture and administered the intrathecal contrast. I also personally supervised acquisition of the myelogram images. TECHNIQUE: Contiguous axial images were obtained through the lumbar spine after the intrathecal infusion of contrast. Coronal and sagittal reconstructions were obtained of the axial image sets. COMPARISON:  Lumbar spine x-rays dated June 10, 2018. CT abdomen pelvis dated June 21, 2017. FINDINGS: LUMBAR MYELOGRAM FINDINGS: 10 mm anterolisthesis at L4-L5 when prone, not significantly changed with standing, flexion, or extension. Prominent ventral extradural defect at L4-L5. Small ventral extradural defects at T12-L1 and L1-L2. Severe  spinal canal stenosis at L4-L5 when prone, improved with standing, flexion, and extension. No nerve root effacement. CT LUMBAR MYELOGRAM FINDINGS: Segmentation: Standard. Alignment: Unchanged 7 mm anterolisthesis at L4-L5. Vertebrae: No acute fracture or other focal pathologic process. Conus medullaris and cauda equina: Conus extends to the L1-L2 level. Conus and cauda equina appear normal. Paraspinal and other soft tissues: Scarring  at the right lung base. Mild central intrahepatic and common bile duct dilatation is likely due to post cholecystectomy state. Aortoiliac atherosclerotic vascular disease. Disc levels: T11-T12: Negative. T12-L1:  Small right paracentral disc protrusion.  No stenosis. L1-L2:  Small right paracentral disc protrusion.  No stenosis. L2-L3:  Negative disc.  Ligamentum flavum hypertrophy.  No stenosis. L3-L4:  Negative disc.  Ligamentum flavum hypertrophy.  No stenosis. L4-L5: Disc uncovering with diffuse disc bulging. Moderate bilateral facet arthropathy with prominent ligamentum flavum hypertrophy. Moderate spinal canal stenosis. Severe left and mild right lateral recess stenosis. Mild to moderate left and mild right neuroforaminal stenosis. L5-S1: Small shallow broad-based posterior disc protrusion. Mild bilateral facet arthropathy. No stenosis. IMPRESSION: 1. 10 mm anterolisthesis at L4-L5 when prone, not significantly changed with dynamic maneuvers, but increased from 7 mm when supine. 2. Severe spinal canal stenosis at L4-L5 when prone, moderate when standing or supine. 3. Severe left lateral recess stenosis at L4-L5 with impingement of the descending left L4 nerve root. Electronically Signed   By: Titus Dubin M.D.   On: 09/17/2018 11:25   Dg Myelography Lumbar Inj Lumbosacral  Result Date: 09/17/2018 CLINICAL DATA:  Chronic left-sided low back pain radiating to the left hip. No leg pain. No prior surgery. EXAM: LUMBAR MYELOGRAM CT LUMBAR MYELOGRAM FLUOROSCOPY TIME:  Radiation  Exposure Index (as provided by the fluoroscopic device): 14.3 mGy Fluoroscopy Time:  1 minutes, 19 seconds Number of Acquired Images:  15 PROCEDURE: After thorough discussion of risks and benefits of the procedure including bleeding, infection, injury to nerves, blood vessels, adjacent structures as well as headache and CSF leak, written and oral informed consent was obtained. Consent was obtained by Dr. Fabiola Backer. Time out form was completed. Patient was positioned prone on the fluoroscopy table. Local anesthesia was provided with 1% lidocaine without epinephrine after prepped and draped in the usual sterile fashion. Puncture was performed at L3-L4 using a 3 1/2 inch 22-gauge spinal needle via right interlaminar approach. Using a single pass through the dura, the needle was placed within the thecal sac, with return of clear CSF. 15 mL of Isovue M-200 was injected into the thecal sac, with normal opacification of the nerve roots and cauda equina consistent with free flow within the subarachnoid space. I personally performed the lumbar puncture and administered the intrathecal contrast. I also personally supervised acquisition of the myelogram images. TECHNIQUE: Contiguous axial images were obtained through the lumbar spine after the intrathecal infusion of contrast. Coronal and sagittal reconstructions were obtained of the axial image sets. COMPARISON:  Lumbar spine x-rays dated June 10, 2018. CT abdomen pelvis dated June 21, 2017. FINDINGS: LUMBAR MYELOGRAM FINDINGS: 10 mm anterolisthesis at L4-L5 when prone, not significantly changed with standing, flexion, or extension. Prominent ventral extradural defect at L4-L5. Small ventral extradural defects at T12-L1 and L1-L2. Severe spinal canal stenosis at L4-L5 when prone, improved with standing, flexion, and extension. No nerve root effacement. CT LUMBAR MYELOGRAM FINDINGS: Segmentation: Standard. Alignment: Unchanged 7 mm anterolisthesis at L4-L5. Vertebrae: No  acute fracture or other focal pathologic process. Conus medullaris and cauda equina: Conus extends to the L1-L2 level. Conus and cauda equina appear normal. Paraspinal and other soft tissues: Scarring at the right lung base. Mild central intrahepatic and common bile duct dilatation is likely due to post cholecystectomy state. Aortoiliac atherosclerotic vascular disease. Disc levels: T11-T12: Negative. T12-L1:  Small right paracentral disc protrusion.  No stenosis. L1-L2:  Small right paracentral disc protrusion.  No stenosis. L2-L3:  Negative disc.  Ligamentum flavum hypertrophy.  No stenosis. L3-L4:  Negative disc.  Ligamentum flavum hypertrophy.  No stenosis. L4-L5: Disc uncovering with diffuse disc bulging. Moderate bilateral facet arthropathy with prominent ligamentum flavum hypertrophy. Moderate spinal canal stenosis. Severe left and mild right lateral recess stenosis. Mild to moderate left and mild right neuroforaminal stenosis. L5-S1: Small shallow broad-based posterior disc protrusion. Mild bilateral facet arthropathy. No stenosis. IMPRESSION: 1. 10 mm anterolisthesis at L4-L5 when prone, not significantly changed with dynamic maneuvers, but increased from 7 mm when supine. 2. Severe spinal canal stenosis at L4-L5 when prone, moderate when standing or supine. 3. Severe left lateral recess stenosis at L4-L5 with impingement of the descending left L4 nerve root. Electronically Signed   By: Titus Dubin M.D.   On: 09/17/2018 11:25     Assessment & Plan:  Plan  I am having [name] maintain [name] nitroGLYCERIN, acetaminophen, furosemide, promethazine, denosumab, lidocaine-prilocaine, carvedilol, triamcinolone ointment, nitrofurantoin, atorvastatin, apixaban, amLODipine, Incruse Ellipta, albuterol, tiZANidine, zolpidem, pantoprazole, oxyCODONE-acetaminophen, and gabapentin.  No orders of the defined types were placed in this encounter.   Problem List Items Addressed This Visit       Unprioritized   Atrial fibrillation/flutter    Per cardiology  On eliquis       Chronic pain syndrome    Due to bowel obstructions due to adhesions  Database reviewed Contract and uds utd       COPD with emphysema ([place])    o2 dependant Per pulmonary      Degenerative arthritis of hip    On chronic pain meds Database reviewed  uds and contract utd      Degenerative disc disease, lumbar    uds utd Database reviewed       Essential hypertension    Well controlled, no changes to meds. Encouraged heart healthy diet such as the DASH diet and exercise as tolerated.       Fibromyalgia    On chronic pain meds Stable       HLD (hyperlipidemia)   Hyperlipidemia LDL goal <100    Encouraged heart healthy diet, increase exercise, avoid trans fats, consider a krill oil cap daily        Other Visit Diagnoses    Need for influenza vaccination    -  Primary   Relevant Orders   Flu Vaccine QUAD High Dose(Fluad) (Completed)   High risk medication use       Relevant Orders   POCT Urine Drug Screen      Follow-up: Return in about 6 months (around 05/09/2019), or if symptoms worsen or fail to improve.  Ann Held, DO

## 2018-11-06 NOTE — Assessment & Plan Note (Signed)
Well controlled, no changes to meds. Encouraged heart healthy diet such as the DASH diet and exercise as tolerated.  °

## 2018-11-06 NOTE — Assessment & Plan Note (Signed)
Encouraged heart healthy diet, increase exercise, avoid trans fats, consider a krill oil cap daily 

## 2018-11-09 NOTE — Progress Notes (Signed)
Reviewed and agree with assessment/plan.   Suzanna Zahn, MD [place] [facility] 03/14/2016, 12:24 PM Pager:  [phone]  

## 2018-11-09 NOTE — Assessment & Plan Note (Signed)
Per cardiology ?On eliquis  ?

## 2018-11-09 NOTE — Assessment & Plan Note (Signed)
Due to bowel obstructions due to adhesions  Database reviewed Contract and uds utd

## 2018-11-09 NOTE — Assessment & Plan Note (Signed)
On chronic pain meds Stable

## 2018-11-09 NOTE — Assessment & Plan Note (Signed)
uds utd Database reviewed

## 2018-11-09 NOTE — Assessment & Plan Note (Signed)
On chronic pain meds Database reviewed  uds and contract utd

## 2018-11-09 NOTE — Assessment & Plan Note (Signed)
o2 dependant Per pulmonary

## 2018-11-10 ENCOUNTER — Other Ambulatory Visit: Payer: Self-pay

## 2018-11-10 NOTE — Patient Outreach (Addendum)
  [facility]) Care Management Chronic Special Needs Program    11/10/2018  Name: PAIJE GOODHART, DOB: 1945/12/30  MRN: [id number]   Ms. Rockie Vawter is enrolled in a chronic special needs plan for Diabetes. Client called in response to message left by husband Luisa Louk concerning a letter she had received from drug company that her request for assistance had been denied. No answer and HIPAA compliant message left. Plan to message Denyse Amass [facility] about client's pharmacy assistance issue.  Husband Mykayla Brinton [facility] Release) returned call HIPAA verified.  Stated that client received letter from drug company that her request had been denied and she would need to resubmit her application. Instructed that [employer] scientist (medical) had been messaged to assist them. Voiced understanding. Plan outreach at next scheduled call.  Peter Garter RN, [name], CDE Chronic Care Management Coordinator [facility] Care Management [phone]

## 2018-11-11 ENCOUNTER — Other Ambulatory Visit: Payer: Self-pay | Admitting: Pharmacy Technician

## 2018-11-11 ENCOUNTER — Telehealth: Payer: Self-pay | Admitting: Physical Medicine and Rehabilitation

## 2018-11-11 ENCOUNTER — Other Ambulatory Visit: Payer: Self-pay | Admitting: Family Medicine

## 2018-11-11 ENCOUNTER — Telehealth: Payer: Self-pay | Admitting: Pharmacist

## 2018-11-11 DIAGNOSIS — I4891 Unspecified atrial fibrillation: Secondary | ICD-10-CM | POA: Diagnosis not present

## 2018-11-11 DIAGNOSIS — J449 Chronic obstructive pulmonary disease, unspecified: Secondary | ICD-10-CM | POA: Diagnosis not present

## 2018-11-11 DIAGNOSIS — R52 Pain, unspecified: Secondary | ICD-10-CM

## 2018-11-11 NOTE — Patient Outreach (Signed)
[facility]) Care Management  11/11/2018  SHAYLEN NEPHEW 14-Sep-1945 [id number]   Called patient to follow up on medication assistance. HIPAA identifiers were obtained. Patient said she no longer had questions as they spoke with [place], [name] and understand about their patient assistance process.  Plan Follow up with patient at our original follow up (November).  Elayne Guerin, PharmD, [place] Clinical Pharmacist [phone]

## 2018-11-11 NOTE — Patient Outreach (Signed)
[place] [facility]) Care Management  11/11/2018  [name] 1946-01-29 [id number]    ADDENDUM  Successful outreach call placed to patient in regards to [place] application for Incruse.  Spoke to patient, HIPAA identifiers verified. Patient then ask that I speak to husband.  Informed patient's husband that patient was enrolled in the [place] program and her membership is active with the medication Incruse until the end of the year 03/19/2019. Informed him that the letter could have been about re enrollment or could have been about the Ventolin that Wyn Quaker was going to change the patient to but instead went with Proventil with [employer]. Patient's husband verbalized understanding. Confirmed patient had name/number.  Goldman Birchall P. Moroni Nester, [place] Management [phone]

## 2018-11-11 NOTE — Patient Outreach (Signed)
[facility] [facility]) Care Management  11/11/2018  [name] 1946-03-05 [id number]  The following inbasket message was received: Elayne Guerin, RPH  Charlie Seda, Luiz Ochoa, CPhT        Fyi--what are your thoughts   Previous Messages  ----- Message -----  From: Dimitri Ped, RN  Sent: 11/10/2018  4:05 PM EDT  To: Elayne Guerin, [facility]  Subject: Pharmacy assistance issue             I received a message from Mrs. Frede' husband that she had received a letter from [place] that said she had been denied. I attempted to call her back and left message. Can you please assist.  Thanks  [place] coordination call placed to Denetra at [place] in regards to patient's application for Incruse.  Denetra informed that patient is still actively enrolled in the program. Inquired if [place] can reverse decisions. Denetra informed at times if audits are performed, and information was entered incorrect, then a decision can be reversed. She informed this was not the case with this patient and she is still actively enrolled. She informed her last refill was placed on 7/1 and per previous encounter (notes) patient received that medication. Denetra informed it could have been a letter about re enrollment which takes place at the beginning of each year for Medicare D patients.  Will followup with patient.  Dael Howland P. Trynity Skousen, [place] Management [phone]

## 2018-11-12 NOTE — Telephone Encounter (Signed)
ok 

## 2018-11-12 NOTE — Telephone Encounter (Signed)
Scheduled for 9/3 with driver.

## 2018-11-13 ENCOUNTER — Other Ambulatory Visit: Payer: Self-pay | Admitting: Family Medicine

## 2018-11-13 DIAGNOSIS — G4700 Insomnia, unspecified: Secondary | ICD-10-CM

## 2018-11-13 NOTE — Telephone Encounter (Signed)
Requesting: Ambien Contract: 11/06/18 UDS: 11/06/2018 Last OV: 11/06/2018 Next OV: 05/07/2019 Last Refill: 10/14/2018, #30--0 RF Database:   Please advise

## 2018-11-17 ENCOUNTER — Other Ambulatory Visit: Payer: Self-pay

## 2018-11-17 ENCOUNTER — Inpatient Hospital Stay: Payer: HMO

## 2018-11-17 ENCOUNTER — Telehealth: Payer: Self-pay | Admitting: Family

## 2018-11-17 ENCOUNTER — Inpatient Hospital Stay (HOSPITAL_BASED_OUTPATIENT_CLINIC_OR_DEPARTMENT_OTHER): Payer: HMO | Admitting: Family

## 2018-11-17 ENCOUNTER — Ambulatory Visit (INDEPENDENT_AMBULATORY_CARE_PROVIDER_SITE_OTHER): Payer: HMO | Admitting: *Deleted

## 2018-11-17 ENCOUNTER — Inpatient Hospital Stay: Payer: HMO | Attending: Family

## 2018-11-17 ENCOUNTER — Encounter: Payer: Self-pay | Admitting: Family

## 2018-11-17 VITALS — BP 135/61 | HR 65 | Temp 98.4°F | Resp 18 | Ht 60.0 in | Wt 87.1 lb

## 2018-11-17 DIAGNOSIS — D631 Anemia in chronic kidney disease: Secondary | ICD-10-CM | POA: Insufficient documentation

## 2018-11-17 DIAGNOSIS — D500 Iron deficiency anemia secondary to blood loss (chronic): Secondary | ICD-10-CM | POA: Diagnosis not present

## 2018-11-17 DIAGNOSIS — D509 Iron deficiency anemia, unspecified: Secondary | ICD-10-CM | POA: Diagnosis not present

## 2018-11-17 DIAGNOSIS — N189 Chronic kidney disease, unspecified: Secondary | ICD-10-CM | POA: Diagnosis not present

## 2018-11-17 DIAGNOSIS — Z950 Presence of cardiac pacemaker: Secondary | ICD-10-CM

## 2018-11-17 DIAGNOSIS — J449 Chronic obstructive pulmonary disease, unspecified: Secondary | ICD-10-CM | POA: Insufficient documentation

## 2018-11-17 DIAGNOSIS — I4891 Unspecified atrial fibrillation: Secondary | ICD-10-CM | POA: Diagnosis not present

## 2018-11-17 LAB — CBC WITH DIFFERENTIAL (CANCER CENTER ONLY)
Abs Immature Granulocytes: 0.01 10*3/uL (ref 0.00–0.07)
Basophils Absolute: 0 10*3/uL (ref 0.0–0.1)
Basophils Relative: 1 %
Eosinophils Absolute: 0.3 10*3/uL (ref 0.0–0.5)
Eosinophils Relative: 5 %
HCT: 33 % — ABNORMAL LOW (ref 36.0–46.0)
Hemoglobin: 10.1 g/dL — ABNORMAL LOW (ref 12.0–15.0)
Immature Granulocytes: 0 %
Lymphocytes Relative: 10 %
Lymphs Abs: 0.6 10*3/uL — ABNORMAL LOW (ref 0.7–4.0)
MCH: 27.8 pg (ref 26.0–34.0)
MCHC: 30.6 g/dL (ref 30.0–36.0)
MCV: 90.9 fL (ref 80.0–100.0)
Monocytes Absolute: 0.7 10*3/uL (ref 0.1–1.0)
Monocytes Relative: 12 %
Neutro Abs: 4.4 10*3/uL (ref 1.7–7.7)
Neutrophils Relative %: 72 %
Platelet Count: 250 10*3/uL (ref 150–400)
RBC: 3.63 MIL/uL — ABNORMAL LOW (ref 3.87–5.11)
RDW: 16.4 % — ABNORMAL HIGH (ref 11.5–15.5)
WBC Count: 6.1 10*3/uL (ref 4.0–10.5)
nRBC: 0 % (ref 0.0–0.2)

## 2018-11-17 LAB — CMP (CANCER CENTER ONLY)
ALT: 11 U/L (ref 0–44)
AST: 15 U/L (ref 15–41)
Albumin: 3.5 g/dL (ref 3.5–5.0)
Alkaline Phosphatase: 100 U/L (ref 38–126)
Anion gap: 7 (ref 5–15)
BUN: 18 mg/dL (ref 8–23)
CO2: 32 mmol/L (ref 22–32)
Calcium: 8.4 mg/dL — ABNORMAL LOW (ref 8.9–10.3)
Chloride: 100 mmol/L (ref 98–111)
Creatinine: 0.89 mg/dL (ref 0.44–1.00)
GFR, Est AFR Am: >60 mL/min (ref >60)
GFR, Estimated: >60 mL/min (ref >60)
Glucose, Bld: 102 mg/dL — ABNORMAL HIGH (ref 70–99)
Potassium: 4.2 mmol/L (ref 3.5–5.1)
Sodium: 139 mmol/L (ref 135–145)
Total Bilirubin: 0.3 mg/dL (ref 0.3–1.2)
Total Protein: 6.6 g/dL (ref 6.5–8.1)

## 2018-11-17 LAB — RETICULOCYTES
Immature Retic Fract: 5.1 % (ref 2.3–15.9)
RBC.: 3.64 MIL/uL — ABNORMAL LOW (ref 3.87–5.11)
Retic Count, Absolute: 28.4 10*3/uL (ref 19.0–186.0)
Retic Ct Pct: 0.8 % (ref 0.4–3.1)

## 2018-11-17 MED ORDER — DARBEPOETIN ALFA 300 MCG/0.6ML IJ SOSY
300.0000 ug | PREFILLED_SYRINGE | Freq: Once | INTRAMUSCULAR | Status: AC
  Administered 2018-11-17: 300 ug via SUBCUTANEOUS

## 2018-11-17 MED ORDER — DARBEPOETIN ALFA 300 MCG/0.6ML IJ SOSY
PREFILLED_SYRINGE | INTRAMUSCULAR | Status: AC
  Filled 2018-11-17: qty 0.6

## 2018-11-17 NOTE — Telephone Encounter (Signed)
Appts scheduled and LMVM for patient per 8/31 los

## 2018-11-17 NOTE — Patient Instructions (Signed)
Darbepoetin Alfa injection What is this medicine? DARBEPOETIN ALFA (dar be POE e tin AL fa) helps your body make more red blood cells. It is used to treat anemia caused by chronic kidney failure and chemotherapy. This medicine may be used for other purposes; ask your health care provider or pharmacist if you have questions. COMMON BRAND NAME(S): Aranesp What should I tell my health care provider before I take this medicine? They need to know if you have any of these conditions:  blood clotting disorders or history of blood clots  cancer patient not on chemotherapy  cystic fibrosis  heart disease, such as angina, heart failure, or a history of a heart attack  hemoglobin level of 12 g/dL or greater  high blood pressure  low levels of folate, iron, or vitamin B12  seizures  an unusual or allergic reaction to darbepoetin, erythropoietin, albumin, hamster proteins, latex, other medicines, foods, dyes, or preservatives  pregnant or trying to get pregnant  breast-feeding How should I use this medicine? This medicine is for injection into a vein or under the skin. It is usually given by a health care professional in a hospital or clinic setting. If you get this medicine at home, you will be taught how to prepare and give this medicine. Use exactly as directed. Take your medicine at regular intervals. Do not take your medicine more often than directed. It is important that you put your used needles and syringes in a special sharps container. Do not put them in a trash can. If you do not have a sharps container, call your pharmacist or healthcare provider to get one. A special MedGuide will be given to you by the pharmacist with each prescription and refill. Be sure to read this information carefully each time. Talk to your pediatrician regarding the use of this medicine in children. While this medicine may be used in children as young as 1 month of age for selected conditions, precautions do  apply. Overdosage: If you think you have taken too much of this medicine contact a poison control center or emergency room at once. NOTE: This medicine is only for you. Do not share this medicine with others. What if I miss a dose? If you miss a dose, take it as soon as you can. If it is almost time for your next dose, take only that dose. Do not take double or extra doses. What may interact with this medicine? Do not take this medicine with any of the following medications:  epoetin alfa This list may not describe all possible interactions. Give your health care provider a list of all the medicines, herbs, non-prescription drugs, or dietary supplements you use. Also tell them if you smoke, drink alcohol, or use illegal drugs. Some items may interact with your medicine. What should I watch for while using this medicine? Your condition will be monitored carefully while you are receiving this medicine. You may need blood work done while you are taking this medicine. This medicine may cause a decrease in vitamin B6. You should make sure that you get enough vitamin B6 while you are taking this medicine. Discuss the foods you eat and the vitamins you take with your health care professional. What side effects may I notice from receiving this medicine? Side effects that you should report to your doctor or health care professional as soon as possible:  allergic reactions like skin rash, itching or hives, swelling of the face, lips, or tongue  breathing problems  changes in   vision  chest pain  confusion, trouble speaking or understanding  feeling faint or lightheaded, falls  high blood pressure  muscle aches or pains  pain, swelling, warmth in the leg  rapid weight gain  severe headaches  sudden numbness or weakness of the face, arm or leg  trouble walking, dizziness, loss of balance or coordination  seizures (convulsions)  swelling of the ankles, feet, hands  unusually weak or  tired Side effects that usually do not require medical attention (report to your doctor or health care professional if they continue or are bothersome):  diarrhea  fever, chills (flu-like symptoms)  headaches  nausea, vomiting  redness, stinging, or swelling at site where injected This list may not describe all possible side effects. Call your doctor for medical advice about side effects. You may report side effects to FDA at 1-800-FDA-1088. Where should I keep my medicine? Keep out of the reach of children. Store in a refrigerator between 2 and 8 degrees C (36 and 46 degrees F). Do not freeze. Do not shake. Throw away any unused portion if using a single-dose vial. Throw away any unused medicine after the expiration date. NOTE: This sheet is a summary. It may not cover all possible information. If you have questions about this medicine, talk to your doctor, pharmacist, or health care provider.  2020 Elsevier/Gold Standard (2017-03-20 16:44:20)  

## 2018-11-17 NOTE — Patient Instructions (Signed)

## 2018-11-17 NOTE — Progress Notes (Signed)
Hematology and Oncology Follow Up Visit  [name] [id number] 12-Dec-1945 73 y.o. 11/17/2018   Principle Diagnosis:  Iron deficiency anemia Erythropoietin deficient anemia   Current Therapy:   IV iron as indicated Aranesp 300 mcg sq q 3 wks for hgb < 11   Interim History:  [name] is here today for follow-up and injection. She is doing well but does have fatigued at times.  Her SOB is controlled on 3L [place] supplemental O2.  She states that she always keeps a dry cough due to COPD.  She was treated last month for pneumonia with an oral antibiotic. She states that her symptoms have resolved.  No fever, chills, n/v, rash, dizziness, chest pain, palpitations, abdominal pain or changes in bowel or bladder habits.  She has a leaky bladder and is followed by urologist [name].  No swelling or tenderness in her extremities. She does have intermittent numbness and tingling in her fingertips.  No falls or syncopal episodes to report.  She has maintained a good appetite and is staying well hydrated. Her weight is stable.   ECOG Performance Status: 1 - Symptomatic but completely ambulatory  Medications:  Allergies as of 11/17/2018      Reactions   Dabigatran Etexilate Mesylate Other (See Comments)   INTERNAL BLEEDING-pradaxa   Talwin [pentazocine] Other (See Comments)   hallucinations   Augmentin [amoxicillin-pot Clavulanate] Nausea Only   diarrhea   Lactate Rash      Medication List       Accurate as of November 17, 2018 11:22 AM. If you have any questions, ask your nurse or doctor.        acetaminophen 325 MG tablet Commonly known as: TYLENOL Take 2 tablets (650 mg total) by mouth every 4 (four) hours as needed.   albuterol 108 (90 Base) MCG/ACT inhaler Commonly known as: VENTOLIN HFA Inhale 2 puffs into the lungs every 6 (six) hours as needed for wheezing or shortness of breath.   amLODipine 2.5 MG tablet Commonly known as: NORVASC Take 1 tablet (2.5 mg total)  by mouth daily.   apixaban 5 MG Tabs tablet Commonly known as: Eliquis Take 1 tablet (5 mg total) by mouth 2 (two) times daily.   atorvastatin 20 MG tablet Commonly known as: LIPITOR Take 1 tablet (20 mg total) by mouth daily at 6 PM.   carvedilol 25 MG tablet Commonly known as: COREG TAKE 1 TABLET BY MOUTH TWICE DAILY   denosumab 60 MG/ML Sosy injection Commonly known as: PROLIA Inject 60 mg into the skin every 6 (six) months.   furosemide 40 MG tablet Commonly known as: LASIX TAKE ONE TABLET (40mg ) BY MOUTH IN THE MORNING   gabapentin 600 MG tablet Commonly known as: NEURONTIN Take 1 tablet by mouth 4 times daily   Incruse Ellipta 62.5 MCG/INH Aepb Generic drug: umeclidinium bromide Inhale 1 puff into the lungs daily.   lidocaine-prilocaine cream Commonly known as: EMLA Apply 1 application topically as needed.   nitrofurantoin 100 MG capsule Commonly known as: MACRODANTIN Take 100 mg by mouth daily.   nitroGLYCERIN 0.4 MG SL tablet Commonly known as: NITROSTAT Place 1 tablet (0.4 mg total) under the tongue every 5 (five) minutes as needed. CHEST PAIN   oxyCODONE-acetaminophen 10-325 MG tablet Commonly known as: PERCOCET Take 1 tablet by mouth every 4 (four) hours as needed. PAIN   pantoprazole 40 MG tablet Commonly known as: PROTONIX Take 1 tablet by mouth twice daily   promethazine 25 MG tablet Commonly  known as: PHENERGAN TAKE ONE TABLET BY MOUTH EVERY 8 HOURS AS NEEDED FOR NAUSEA   tiZANidine 4 MG tablet Commonly known as: ZANAFLEX Take 1 tablet by mouth three times daily as needed for muscle spasm   triamcinolone ointment 0.1 % Commonly known as: KENALOG APPLY TO SKIN TWICE DAILY FOR 14 DAYS   zolpidem 5 MG tablet Commonly known as: AMBIEN TAKE 1 TABLET BY MOUTH EVERY DAY AT BEDTIME AS NEEDED       Allergies:  Allergies  Allergen Reactions  . Dabigatran Etexilate Mesylate Other (See Comments)    INTERNAL BLEEDING-pradaxa  . Talwin  [Pentazocine] Other (See Comments)    hallucinations  . Augmentin [Amoxicillin-Pot Clavulanate] Nausea Only    diarrhea  . Lactate Rash    Past Medical History, Surgical history, Social history, and Family History were reviewed and updated.  Review of Systems: All other 10 point review of systems is negative.   Physical Exam:  vitals were not taken for this visit.   Wt Readings from Last 3 Encounters:  11/06/18 87 lb (39.5 kg)  10/16/18 85 lb (38.6 kg)  09/25/18 88 lb 3.2 oz (40 kg)    Ocular: Sclerae unicteric, pupils equal, round and reactive to light Ear-nose-throat: Oropharynx clear, dentition fair Lymphatic: No cervical or supraclavicular adenopathy Lungs no rales or rhonchi, good excursion bilaterally Heart regular rate and rhythm, no murmur appreciated Abd soft, nontender, positive bowel sounds, no liver or spleen tip palpated on exam, no fluid wave  MSK no focal spinal tenderness, no joint edema Neuro: non-focal, well-oriented, appropriate affect Breasts: Deferred   Lab Results  Component Value Date   WBC 6.1 11/17/2018   HGB 10.1 (L) 11/17/2018   HCT 33.0 (L) 11/17/2018   MCV 90.9 11/17/2018   PLT 250 11/17/2018   Lab Results  Component Value Date   FERRITIN 588 (H) 10/16/2018   IRON 61 10/16/2018   TIBC 206 (L) 10/16/2018   UIBC 145 10/16/2018   IRONPCTSAT 29 10/16/2018   Lab Results  Component Value Date   RETICCTPCT 0.8 11/17/2018   RBC 3.63 (L) 11/17/2018   RBC 3.64 (L) 11/17/2018   RETICCTABS 64.6 04/01/2009   No results found for: KPAFRELGTCHN, LAMBDASER, KAPLAMBRATIO No results found for: IGGSERUM, IGA, IGMSERUM No results found for: Kathrynn Ducking, MSPIKE, SPEI   Chemistry      Component Value Date/Time   NA 139 11/17/2018 1045   K 4.2 11/17/2018 1045   CL 100 11/17/2018 1045   CO2 32 11/17/2018 1045   BUN 18 11/17/2018 1045   CREATININE 0.89 11/17/2018 1045   CREATININE 0.91 03/16/2016 1539       Component Value Date/Time   CALCIUM 8.4 (L) 11/17/2018 1045   ALKPHOS 100 11/17/2018 1045   AST 15 11/17/2018 1045   ALT 11 11/17/2018 1045   BILITOT 0.3 11/17/2018 1045       Impression and Plan: [name] is a very pleasant 73 yo caucasian female with iron deficiency anemia secondary to malabsorption and possible GI blood loss with Eliquis.  She received Aranesp today for Hgb 10.1.  We will see what her iron looks like and replace that next week if needed.  We will see her back in another month.  She will contact our office with any questions or concerns. We can certainly see her sooner if needed.   Laverna Peace, NP 8/31/202011:22 AM

## 2018-11-18 ENCOUNTER — Other Ambulatory Visit: Payer: Self-pay | Admitting: Family

## 2018-11-18 ENCOUNTER — Telehealth: Payer: Self-pay | Admitting: Family

## 2018-11-18 LAB — CUP PACEART REMOTE DEVICE CHECK
Battery Remaining Longevity: 4 mo
Battery Voltage: 2.82 V
Brady Statistic AP VP Percent: 0.75 %
Brady Statistic AP VS Percent: 0.01 %
Brady Statistic AS VP Percent: 99.18 %
Brady Statistic AS VS Percent: 0.06 %
Brady Statistic RA Percent Paced: 0.75 %
Brady Statistic RV Percent Paced: 99.91 %
Date Time Interrogation Session: 20200901020320
Implantable Lead Implant Date: 20111005
Implantable Lead Implant Date: 20111005
Implantable Lead Implant Date: 20111005
Implantable Lead Location: 753858
Implantable Lead Location: 753859
Implantable Lead Location: 753860
Implantable Lead Model: 4196
Implantable Lead Model: 5076
Implantable Lead Model: 5076
Implantable Pulse Generator Implant Date: 20111005
Lead Channel Impedance Value: 323 Ohm
Lead Channel Impedance Value: 380 Ohm
Lead Channel Impedance Value: 418 Ohm
Lead Channel Impedance Value: 418 Ohm
Lead Channel Impedance Value: 437 Ohm
Lead Channel Impedance Value: 494 Ohm
Lead Channel Impedance Value: 494 Ohm
Lead Channel Impedance Value: 513 Ohm
Lead Channel Impedance Value: 703 Ohm
Lead Channel Pacing Threshold Amplitude: 0.375 V
Lead Channel Pacing Threshold Amplitude: 0.5 V
Lead Channel Pacing Threshold Pulse Width: 0.4 ms
Lead Channel Pacing Threshold Pulse Width: 0.4 ms
Lead Channel Sensing Intrinsic Amplitude: 1.125 mV
Lead Channel Sensing Intrinsic Amplitude: 1.125 mV
Lead Channel Sensing Intrinsic Amplitude: 4.875 mV
Lead Channel Sensing Intrinsic Amplitude: 4.875 mV
Lead Channel Setting Pacing Amplitude: 1.25 V
Lead Channel Setting Pacing Amplitude: 2 V
Lead Channel Setting Pacing Amplitude: 2.5 V
Lead Channel Setting Pacing Pulse Width: 0.4 ms
Lead Channel Setting Pacing Pulse Width: 0.8 ms
Lead Channel Setting Sensing Sensitivity: 0.9 mV

## 2018-11-18 LAB — IRON AND TIBC
Iron: 45 ug/dL (ref 41–142)
Saturation Ratios: 20 % — ABNORMAL LOW (ref 21–57)
TIBC: 226 ug/dL — ABNORMAL LOW (ref 236–444)
UIBC: 182 ug/dL (ref 120–384)

## 2018-11-18 LAB — FERRITIN: Ferritin: 641 ng/mL — ABNORMAL HIGH (ref 11–307)

## 2018-11-18 NOTE — Telephone Encounter (Signed)
Confirmed iron infusion 9/9 at 11 am with patient per 9/1 sch msg

## 2018-11-20 ENCOUNTER — Ambulatory Visit: Payer: Self-pay

## 2018-11-20 ENCOUNTER — Encounter: Payer: Self-pay | Admitting: Physical Medicine and Rehabilitation

## 2018-11-20 ENCOUNTER — Ambulatory Visit (INDEPENDENT_AMBULATORY_CARE_PROVIDER_SITE_OTHER): Payer: HMO | Admitting: Physical Medicine and Rehabilitation

## 2018-11-20 VITALS — BP 101/48 | HR 80

## 2018-11-20 DIAGNOSIS — M5416 Radiculopathy, lumbar region: Secondary | ICD-10-CM | POA: Diagnosis not present

## 2018-11-20 MED ORDER — BETAMETHASONE SOD PHOS & ACET 6 (3-3) MG/ML IJ SUSP
12.0000 mg | Freq: Once | INTRAMUSCULAR | Status: AC
  Administered 2018-11-20: 12 mg

## 2018-11-20 NOTE — Progress Notes (Signed)
 .  Numeric Pain Rating Scale and Functional Assessment Average Pain 6   In the last MONTH (on 0-10 scale) has pain interfered with the following?  1. General activity like being  able to carry out your everyday physical activities such as walking, climbing stairs, carrying groceries, or moving a chair?  Rating(6)   +Driver, +BT(eliquis, ok for inj), -Dye Allergies.

## 2018-11-21 ENCOUNTER — Other Ambulatory Visit: Payer: Self-pay | Admitting: Family Medicine

## 2018-11-21 DIAGNOSIS — R52 Pain, unspecified: Secondary | ICD-10-CM

## 2018-11-21 MED ORDER — OXYCODONE-ACETAMINOPHEN 10-325 MG PO TABS: 1.0000 | ORAL_TABLET | ORAL | 0 refills | Status: DC | PRN

## 2018-11-21 NOTE — Telephone Encounter (Signed)
Requesting: Percocet Contract: 11/06/2018 UDS:  Last OV: 11/06/2018 Next OV: 05/07/2019 Last Refill: 10/24/2018, #[id number] RF Database:   Please advise

## 2018-11-21 NOTE — Telephone Encounter (Signed)
Requested medication (s) are due for refill today: yes  Requested medication (s) are on the active medication list: yes  Last refill:  10/24/2018  Future visit scheduled: yes  Notes to clinic:  This refill cannot be delegated   Requested Prescriptions  Pending Prescriptions Disp Refills   oxyCODONE-acetaminophen (PERCOCET) 10-325 MG tablet 120 tablet 0    Sig: Take 1 tablet by mouth every 4 (four) hours as needed. PAIN     Not Delegated - Analgesics:  Opioid Agonist Combinations Failed - 11/21/2018 10:25 AM      Failed - This refill cannot be delegated      Failed - Urine Drug Screen completed in last 360 days.      Passed - Valid encounter within last 6 months    Recent Outpatient Visits          2 weeks ago Need for influenza vaccination   [facility] [place], [place]   3 months ago Hyperlipidemia LDL goal <100   [facility] [place], [place]   11 months ago Bad odor of urine   [facility] [place], [place]   1 year ago Anemia, chronic disease   [facility] [place], [place]   1 year ago Anemia, unspecified type   [facility] [place], [place]      Future Appointments            In 5 months [facility] at [facility], [facility]

## 2018-11-21 NOTE — Progress Notes (Signed)
[name] - 73 y.o. female MRN [id number]  Date of birth: May 05, 1945  Office Visit Note: Visit Date: 11/20/2018 PCP: Ann Held, DO Referred by: Ann Held, *  Subjective: Chief Complaint  Patient presents with  . Lower Back - Pain   HPI:  [name] is a 73 y.o. female who comes in today For left L4 transforaminal epidural steroid injection diagnostically hopefully therapeutically.  She was originally seen by Dr. Jean Rosenthal in our office.  Her case is complicated by COPD and iron deficiency anemia.  She is very thin individual 87 pounds.  She continued to have pain in the left low back and buttocks and hamstring which was more lateral to posterior lateral.  We completed bilateral L4 transforaminal injection in early August with about 4 weeks of outstanding relief.  Symptoms have returned but are not as bad as they were before.  She rates her pain as a 6 out of 10.  Again symptoms are mainly left side so we are going to complete left-sided L4 transforaminal injection again diagnostically.  Depending on relief would look at an L4 transforaminal approach versus interlaminar approach at L5-S1.  CT myelogram again reviewed below showing pretty significant stenosis at L4-5 with listhesis.  ROS Otherwise per HPI.  Assessment & Plan: Visit Diagnoses:  1. Lumbar radiculopathy     Plan: No additional findings.   Meds & Orders:  Meds ordered this encounter  Medications  . betamethasone acetate-betamethasone sodium phosphate (CELESTONE) injection 12 mg    Orders Placed This Encounter  Procedures  . XR C-ARM NO REPORT  . Epidural Steroid injection    Follow-up: Return if symptoms worsen or fail to improve.   Procedures: No procedures performed  Lumbosacral Transforaminal Epidural Steroid Injection - Sub-Pedicular Approach with Fluoroscopic Guidance  Patient: [name]      Date of Birth: Aug 25, 1945 MRN: [id number] PCP: Ann Held, DO      Visit Date: 11/20/2018   Universal Protocol:    Date/Time: 11/20/2018  Consent Given By: the patient  Position: PRONE  Additional Comments: Vital signs were monitored before and after the procedure. Patient was prepped and draped in the usual sterile fashion. The correct patient, procedure, and site was verified.   Injection Procedure Details:  Procedure Site One Meds Administered:  Meds ordered this encounter  Medications  . betamethasone acetate-betamethasone sodium phosphate (CELESTONE) injection 12 mg    Laterality: Left  Location/Site:  L4-L5  Needle size: 22 G  Needle type: Spinal  Needle Placement: Transforaminal  Findings:    -Comments: Excellent flow of contrast along the nerve and into the epidural space.  Procedure Details: After squaring off the end-plates to get a true AP view, the C-arm was positioned so that an oblique view of the foramen as noted above was visualized. The target area is just inferior to the "nose of the scotty dog" or sub pedicular. The soft tissues overlying this structure were infiltrated with 2-3 ml. of 1% Lidocaine without Epinephrine.  The spinal needle was inserted toward the target using a "trajectory" view along the fluoroscope beam.  Under AP and lateral visualization, the needle was advanced so it did not puncture dura and was located close the 6 O'Clock position of the pedical in AP tracterory. Biplanar projections were used to confirm position. Aspiration was confirmed to be negative for CSF and/or blood. A 1-2 ml. volume of Isovue-250 was injected and flow of contrast was  noted at each level. Radiographs were obtained for documentation purposes.   After attaining the desired flow of contrast documented above, a 0.5 to 1.0 ml test dose of 0.25% Marcaine was injected into each respective transforaminal space.  The patient was observed for 90 seconds post injection.  After no sensory deficits were reported, and normal  lower extremity motor function was noted,   the above injectate was administered so that equal amounts of the injectate were placed at each foramen (level) into the transforaminal epidural space.   Additional Comments:  The patient tolerated the procedure well Dressing: 2 x 2 sterile gauze and Band-Aid    Post-procedure details: Patient was observed during the procedure. Post-procedure instructions were reviewed.  Patient left the clinic in stable condition.     Clinical History: CT LUMBAR MYELOGRAM FINDINGS:  Segmentation: Standard.  Alignment: Unchanged 7 mm anterolisthesis at L4-L5.  Vertebrae: No acute fracture or other focal pathologic process.  Conus medullaris and cauda equina: Conus extends to the L1-L2 level. Conus and cauda equina appear normal.  Paraspinal and other soft tissues: Scarring at the right lung base. Mild central intrahepatic and common bile duct dilatation is likely due to post cholecystectomy state. Aortoiliac atherosclerotic vascular disease.  Disc levels:  T11-T12: Negative.  T12-L1:  Small right paracentral disc protrusion.  No stenosis.  L1-L2:  Small right paracentral disc protrusion.  No stenosis.  L2-L3:  Negative disc.  Ligamentum flavum hypertrophy.  No stenosis.  L3-L4:  Negative disc.  Ligamentum flavum hypertrophy.  No stenosis.  L4-L5: Disc uncovering with diffuse disc bulging. Moderate bilateral facet arthropathy with prominent ligamentum flavum hypertrophy. Moderate spinal canal stenosis. Severe left and mild right lateral recess stenosis. Mild to moderate left and mild right neuroforaminal stenosis.  L5-S1: Small shallow broad-based posterior disc protrusion. Mild bilateral facet arthropathy. No stenosis.  IMPRESSION: 1. 10 mm anterolisthesis at L4-L5 when prone, not significantly changed with dynamic maneuvers, but increased from 7 mm when supine. 2. Severe spinal canal stenosis at L4-L5 when prone,  moderate when standing or supine. 3. Severe left lateral recess stenosis at L4-L5 with impingement of the descending left L4 nerve root.   Electronically Signed   By: Titus Dubin M.D.   On: 09/17/2018 11:25     Objective:  VS:  HT:    WT:   BMI:     BP:(!) 101/48  HR:80bpm  TEMP: ( )  RESP:  Physical Exam  Ortho Exam Imaging: Xr C-arm No Report  Result Date: 11/20/2018 Please see Notes tab for imaging impression.

## 2018-11-21 NOTE — Telephone Encounter (Signed)
Medication Refill - Medication: oxyCODONE-acetaminophen (PERCOCET) 10-325 MG tablet   Has the patient contacted their pharmacy? No. (Agent: If no, request that the patient contact the pharmacy for the refill.) (Agent: If yes, when and what did the pharmacy advise?)  Preferred Pharmacy (with phone number or street name):  [facility], [place] [address]  Phone: [phone] Fax: [phone]  Not a 24 hour pharmacy; exact hours not known.     Agent: Please be advised that RX refills may take up to 3 business days. We ask that you follow-up with your pharmacy.

## 2018-11-21 NOTE — Procedures (Signed)
Lumbosacral Transforaminal Epidural Steroid Injection - Sub-Pedicular Approach with Fluoroscopic Guidance  Patient: [name]      Date of Birth: [birth year] MRN: [id number] PCP: Ann Held, DO      Visit Date: 11/20/2018   Universal Protocol:    Date/Time: 11/20/2018  Consent Given By: the patient  Position: PRONE  Additional Comments: Vital signs were monitored before and after the procedure. Patient was prepped and draped in the usual sterile fashion. The correct patient, procedure, and site was verified.   Injection Procedure Details:  Procedure Site One Meds Administered:  Meds ordered this encounter  Medications  . betamethasone acetate-betamethasone sodium phosphate (CELESTONE) injection 12 mg    Laterality: Left  Location/Site:  L4-L5  Needle size: 22 G  Needle type: Spinal  Needle Placement: Transforaminal  Findings:    -Comments: Excellent flow of contrast along the nerve and into the epidural space.  Procedure Details: After squaring off the end-plates to get a true AP view, the C-arm was positioned so that an oblique view of the foramen as noted above was visualized. The target area is just inferior to the "nose of the scotty dog" or sub pedicular. The soft tissues overlying this structure were infiltrated with 2-3 ml. of 1% Lidocaine without Epinephrine.  The spinal needle was inserted toward the target using a "trajectory" view along the fluoroscope beam.  Under AP and lateral visualization, the needle was advanced so it did not puncture dura and was located close the 6 O'Clock position of the pedical in AP tracterory. Biplanar projections were used to confirm position. Aspiration was confirmed to be negative for CSF and/or blood. A 1-2 ml. volume of Isovue-250 was injected and flow of contrast was noted at each level. Radiographs were obtained for documentation purposes.   After attaining the desired flow of contrast documented above, a  0.5 to 1.0 ml test dose of 0.25% Marcaine was injected into each respective transforaminal space.  The patient was observed for 90 seconds post injection.  After no sensory deficits were reported, and normal lower extremity motor function was noted,   the above injectate was administered so that equal amounts of the injectate were placed at each foramen (level) into the transforaminal epidural space.   Additional Comments:  The patient tolerated the procedure well Dressing: 2 x 2 sterile gauze and Band-Aid    Post-procedure details: Patient was observed during the procedure. Post-procedure instructions were reviewed.  Patient left the clinic in stable condition.

## 2018-11-26 ENCOUNTER — Encounter: Payer: Self-pay | Admitting: Cardiology

## 2018-11-26 ENCOUNTER — Other Ambulatory Visit: Payer: Self-pay

## 2018-11-26 ENCOUNTER — Inpatient Hospital Stay: Payer: HMO | Attending: Family

## 2018-11-26 VITALS — BP 114/53 | HR 68 | Temp 97.7°F | Resp 20

## 2018-11-26 DIAGNOSIS — K909 Intestinal malabsorption, unspecified: Secondary | ICD-10-CM | POA: Insufficient documentation

## 2018-11-26 DIAGNOSIS — D500 Iron deficiency anemia secondary to blood loss (chronic): Secondary | ICD-10-CM

## 2018-11-26 DIAGNOSIS — D508 Other iron deficiency anemias: Secondary | ICD-10-CM | POA: Insufficient documentation

## 2018-11-26 MED ORDER — SODIUM CHLORIDE 0.9% FLUSH
3.0000 mL | Freq: Once | INTRAVENOUS | Status: DC | PRN
  Filled 2018-11-26: qty 10

## 2018-11-26 MED ORDER — SODIUM CHLORIDE 0.9 % IV SOLN
510.0000 mg | Freq: Once | INTRAVENOUS | Status: AC
  Administered 2018-11-26: 510 mg via INTRAVENOUS
  Filled 2018-11-26: qty 510

## 2018-11-26 MED ORDER — SODIUM CHLORIDE 0.9% FLUSH
10.0000 mL | INTRAVENOUS | Status: DC | PRN
  Administered 2018-11-26: 10 mL via INTRAVENOUS
  Filled 2018-11-26: qty 10

## 2018-11-26 MED ORDER — HEPARIN SOD (PORK) LOCK FLUSH 100 UNIT/ML IV SOLN
500.0000 [IU] | Freq: Once | INTRAVENOUS | Status: AC
  Administered 2018-11-26: 500 [IU] via INTRAVENOUS
  Filled 2018-11-26: qty 5

## 2018-11-26 MED ORDER — ALTEPLASE 2 MG IJ SOLR
2.0000 mg | Freq: Once | INTRAMUSCULAR | Status: DC | PRN
  Filled 2018-11-26: qty 2

## 2018-11-26 MED ORDER — HEPARIN SOD (PORK) LOCK FLUSH 100 UNIT/ML IV SOLN
250.0000 [IU] | Freq: Once | INTRAVENOUS | Status: DC | PRN
  Filled 2018-11-26: qty 5

## 2018-11-26 NOTE — Progress Notes (Signed)
Remote pacemaker transmission.   

## 2018-11-26 NOTE — Addendum Note (Signed)
Addended by: Tiajuana Amass on: 11/26/2018 09:33 AM   Modules accepted: Level of Service

## 2018-12-01 ENCOUNTER — Other Ambulatory Visit: Payer: Self-pay | Admitting: Internal Medicine

## 2018-12-09 ENCOUNTER — Emergency Department (HOSPITAL_BASED_OUTPATIENT_CLINIC_OR_DEPARTMENT_OTHER)
Admission: EM | Admit: 2018-12-09 | Discharge: 2018-12-09 | Disposition: A | Discharge status: Home / Self Care | Payer: HMO | Attending: Emergency Medicine | Admitting: Emergency Medicine

## 2018-12-09 ENCOUNTER — Encounter (HOSPITAL_BASED_OUTPATIENT_CLINIC_OR_DEPARTMENT_OTHER): Payer: Self-pay | Admitting: Emergency Medicine

## 2018-12-09 ENCOUNTER — Other Ambulatory Visit: Payer: Self-pay

## 2018-12-09 ENCOUNTER — Emergency Department (HOSPITAL_BASED_OUTPATIENT_CLINIC_OR_DEPARTMENT_OTHER): Payer: HMO

## 2018-12-09 DIAGNOSIS — W010XXA Fall on same level from slipping, tripping and stumbling without subsequent striking against object, initial encounter: Secondary | ICD-10-CM | POA: Insufficient documentation

## 2018-12-09 DIAGNOSIS — Z7901 Long term (current) use of anticoagulants: Secondary | ICD-10-CM | POA: Diagnosis not present

## 2018-12-09 DIAGNOSIS — Y999 Unspecified external cause status: Secondary | ICD-10-CM | POA: Insufficient documentation

## 2018-12-09 DIAGNOSIS — S0003XA Contusion of scalp, initial encounter: Secondary | ICD-10-CM | POA: Diagnosis not present

## 2018-12-09 DIAGNOSIS — S0990XA Unspecified injury of head, initial encounter: Secondary | ICD-10-CM | POA: Insufficient documentation

## 2018-12-09 DIAGNOSIS — Z23 Encounter for immunization: Secondary | ICD-10-CM | POA: Insufficient documentation

## 2018-12-09 DIAGNOSIS — I110 Hypertensive heart disease with heart failure: Secondary | ICD-10-CM | POA: Diagnosis not present

## 2018-12-09 DIAGNOSIS — Y92000 Kitchen of unspecified non-institutional (private) residence as  the place of occurrence of the external cause: Secondary | ICD-10-CM | POA: Insufficient documentation

## 2018-12-09 DIAGNOSIS — I2510 Atherosclerotic heart disease of native coronary artery without angina pectoris: Secondary | ICD-10-CM | POA: Diagnosis not present

## 2018-12-09 DIAGNOSIS — I5032 Chronic diastolic (congestive) heart failure: Secondary | ICD-10-CM | POA: Insufficient documentation

## 2018-12-09 DIAGNOSIS — W19XXXA Unspecified fall, initial encounter: Secondary | ICD-10-CM

## 2018-12-09 DIAGNOSIS — Z79899 Other long term (current) drug therapy: Secondary | ICD-10-CM | POA: Insufficient documentation

## 2018-12-09 DIAGNOSIS — Y939 Activity, unspecified: Secondary | ICD-10-CM | POA: Diagnosis not present

## 2018-12-09 DIAGNOSIS — Z96641 Presence of right artificial hip joint: Secondary | ICD-10-CM | POA: Diagnosis not present

## 2018-12-09 DIAGNOSIS — Z8673 Personal history of transient ischemic attack (TIA), and cerebral infarction without residual deficits: Secondary | ICD-10-CM | POA: Insufficient documentation

## 2018-12-09 DIAGNOSIS — Z87891 Personal history of nicotine dependence: Secondary | ICD-10-CM | POA: Diagnosis not present

## 2018-12-09 DIAGNOSIS — S0181XA Laceration without foreign body of other part of head, initial encounter: Secondary | ICD-10-CM | POA: Diagnosis not present

## 2018-12-09 MED ORDER — LIDOCAINE-EPINEPHRINE (PF) 2 %-1:200000 IJ SOLN
10.0000 mL | Freq: Once | INTRAMUSCULAR | Status: AC
  Administered 2018-12-09: 10 mL
  Filled 2018-12-09 (×2): qty 10

## 2018-12-09 MED ORDER — TETANUS-DIPHTH-ACELL PERTUSSIS 5-2.5-18.5 LF-MCG/0.5 IM SUSP
0.5000 mL | Freq: Once | INTRAMUSCULAR | Status: AC
  Administered 2018-12-09: 0.5 mL via INTRAMUSCULAR
  Filled 2018-12-09: qty 0.5

## 2018-12-09 NOTE — ED Provider Notes (Signed)
[facility] EMERGENCY DEPARTMENT Provider Note   CSN: [id number] Arrival date & time: 12/09/18  0736     History   Chief Complaint Chief Complaint  Patient presents with  . Fall    HPI [name] is a 73 y.o. female.  She is on anticoagulation.  She had a mechanical fall tripped over her dog in the kitchen and struck her head on the kitchen table.  Denies any loss consciousness.  Complaining of a forehead laceration.  Minimal pain.  No numbness or weakness no blurry vision no shortness of breath above baseline.     The history is provided by the patient.  Fall This is a new problem. The current episode started 1 to 2 hours ago. The problem has been rapidly improving. Associated symptoms include headaches. Pertinent negatives include no chest pain, no abdominal pain and no shortness of breath. Nothing aggravates the symptoms. Nothing relieves the symptoms. She has tried nothing for the symptoms. The treatment provided no relief.    Past Medical History:  Diagnosis Date  . Anemia   . Arthritis    right hip  . Atrial fibrillation ([place])   . CAD (coronary artery disease)    mild disease per cath in 2011  . CHF (congestive heart failure) ([place])   . COPD (chronic obstructive pulmonary disease) (HCC)    continuous O2- 2l  . Diverticulitis   . Elbow fracture, left aug 2012  . Erythropoietin deficiency anemia 06/19/2018  . Fibromyalgia    COUPLE OF TIMES A YEAR-PT HAS EPISODES OF CONFUSION-USUALLY INVOLVES DAY/NIGHT REVERSAL AND EPISODES OF TWITCHING AND FALLS--SEES DR. Jacelyn Grip - NEUROLOGIST-LAST SEEN 07/10/11  . Gastroparesis   . GERD (gastroesophageal reflux disease)   . H/O: GI bleed    from Pradaxa  . Hepatitis    hx of in high school   . HTN (hypertension)    under control; has been on med. x "years"  . Hx MRSA infection   . Hx of gastric ulcer   . Hx of stress fx aug 2011   right hip   . Iron malabsorption 08/14/2017  . LBBB (left bundle branch block)   .  Oxygen dependent    2 liters via nasal cannula at all times  . Pacemaker    CRT therapy; followed by Dr. Caryl Comes  . Pleural effusion     s/p right thoracentesis 03/09  . Pneumonia - 03/09, 12/09, 02/10, 06/10   MOST RECENT FEB 2013  . Primary dilated cardiomyopathy (HCC)    EF 45 to 50% per echo in Jan 2012  . RLS (restless legs syndrome)   . Silent aspiration   . Small bowel obstruction ([place])   . Urinary incontinence     -INTERSTIM IMPLANT NOT FUNCTIONING PER PT  . Venous embolism and thrombosis of subclavian vein ([place])    after pacemaker insertion Oct 2011  . Vitamin B12 deficiency     Patient Active Problem List   Diagnosis Date Noted  . Educated About Covid-19 Virus Infection 09/07/2018  . Erythropoietin deficiency anemia 06/19/2018  . Swelling of calf 02/04/2018  . Bad odor of urine 12/09/2017  . Iron deficiency anemia due to chronic blood loss 08/14/2017  . Iron malabsorption 08/14/2017  . Hyperlipidemia LDL goal <100 07/08/2017  . Elevated ALT measurement   . Hypotension 06/20/2017  . HLD (hyperlipidemia) 06/20/2017  . CAD (coronary artery disease) 06/20/2017  . Protein-calorie malnutrition, severe ([place]) 06/20/2017  . Generalized weakness   . Weakness 05/30/2017  .  Focal neurological deficit 05/30/2017  . Chronic diastolic CHF (congestive heart failure) ([place]) 05/30/2017  . TIA (transient ischemic attack) 05/30/2017  . AKI (acute kidney injury) ([place]) 05/30/2017  . Abnormal LFTs 05/30/2017  . Bronchiectasis ([place]) 04/22/2017  . Right otitis media 07/17/2016  . Cerumen impaction 07/17/2016  . Osteoporosis 03/16/2016  . Headache 11/20/2014  . Skin infection 05/27/2014  . Insomnia 05/27/2014  . Abdominal wall pain 07/09/2013  . Other dysphagia 02/02/2013  . Platelets decreased ([place]) 01/29/2012  . Vitamin D deficiency 12/13/2011  . Hypokalemia 12/13/2011  . Hypocalcemia 12/12/2011  . Adhesions of intestine, very dense, with frozen abdomen 12/11/2011  . SBO (small  bowel obstruction), recurrent 12/11/2011  . Protein-calorie malnutrition, moderate ([place]) 12/11/2011  . Oxygen dependent   . Hx of gastric ulcer   . UTI (urinary tract infection) 10/15/2011  . Allergic conjunctivitis 10/15/2011  . Degenerative arthritis of hip 08/03/2011  . B12 deficiency 07/02/2011  . Bilateral leg weakness 01/16/2011  . Anemia, chronic disease 09/14/2010  . Recurrent pneumonia 08/11/2010  . CRT- [employer] 06/26/2010  . DYSURIA 05/19/2010  . Chronic hypoxemic respiratory failure ([employer]) 02/21/2010  . DEHYDRATION 01/06/2010  . GASTROENTERITIS 01/06/2010  . Congestive dilated cardiomyopathy ([place]) 11/17/2009  . LBBB 11/17/2009  . SYSTOLIC HEART FAILURE, CHRONIC 11/17/2009  . GERD (gastroesophageal reflux disease) 02/17/2009  . NONSPECIFIC ABN FINDNG RAD&OTH EXAM BILARY TRCT 09/03/2008  . COPD with emphysema ([place]) 06/28/2008  . DIVERTICULOSIS, COLON 06/03/2008  . GASTRITIS 06/02/2008  . Atrial fibrillation/flutter 12/08/2007  . GASTROPARESIS 08/07/2007  . NAUSEA, CHRONIC 08/07/2007  . Abdominal pain 08/07/2007  . SYNCOPE 02/17/2007  . RESTLESS LEG SYNDROME 08/01/2006  . Chronic pain syndrome 08/01/2006  . OSTEOARTHRITIS 07/31/2006  . Degenerative disc disease, lumbar 07/31/2006  . Fibromyalgia 07/31/2006  . CHRONIC FATIGUE SYNDROME 07/31/2006  . URINARY INCONTINENCE 07/31/2006  . ANEMIA-IRON DEFICIENCY 05/01/2006  . Essential hypertension 05/01/2006    Past Surgical History:  Procedure Laterality Date  . APPENDECTOMY    . CARDIAC CATHETERIZATION  04/08/2006, 11/16/2009  . CHOLECYSTECTOMY N/A 06/27/2017   Procedure: OPEN CHOLECYSTECTOMY;  Surgeon: Ralene Ok, MD;  Location: [facility];  Service: General;  Laterality: N/A;  . COLECTOMY     Intestinal resection (5times)  . COLECTOMY  02/04/2000   ex. lap., intra-abd. subtotal colectomy with ileosigmoid colon anastomosies and lysis of adhesions  . CYSTOSCOPY  11/11/2008  . CYSTOSCOPY W/ RETROGRADES  11/11/2008    right  . CYSTOSCOPY WITH INJECTION  04/30/2006   transurethral collagen injection; incision vaginal stenosis  . ERCP N/A 06/23/2017   Procedure: ENDOSCOPIC RETROGRADE CHOLANGIOPANCREATOGRAPHY (ERCP);  Surgeon: Carol Ada, MD;  Location: [facility];  Service: Endoscopy;  Laterality: N/A;  . ERCP N/A 06/24/2017   Procedure: ENDOSCOPIC RETROGRADE CHOLANGIOPANCREATOGRAPHY (ERCP);  Surgeon: [name], MD;  Location[facility]COPY;  Service: Endoscopy;  Laterality: N/A;  . ESOPHAGOGASTRODUODENOSCOPY N/A 02/02/2013   Procedure: ESOPHAGOGASTRODUODENOSCOPY (EGD);  Surgeon: Lafayette Dragon, MD;  Location: [facility];  Service: Endoscopy;  Laterality: N/A;  . EXPLORATORY LAPAROTOMY  04/27/2009   lysis of adhesions, gastrostomy tube  . GASTROCUTANEOUS FISTULA CLOSURE    . INTERSTIM IMPLANT PLACEMENT  05/28/2006 - stage I   06/05/2006 - stage II  . INTERSTIM IMPLANT PLACEMENT  02/05/2012   Procedure: [name] IMPLANT FIRST STAGE;  Surgeon: Reece Packer, MD;  Location: [facility];  Service: Urology;  Laterality: N/A;  Replacement of Interstim Lead     . INTERSTIM IMPLANT REVISION  03/06/2011   Procedure: REVISION OF INTERSTIM;  Surgeon: Reece Packer, MD;  Location: [facility];  Service: Urology;  Laterality: N/A;  Replacement of Neurostimulator  . INTERSTIM IMPLANT REVISION  10/23/2007  . IR IMAGING GUIDED PORT INSERTION  10/14/2017  . PACEMAKER PLACEMENT  12/21/2009  . Peg removed     with complications  . PORT-A-CATH REMOVAL  07/27/2011   Procedure: REMOVAL PORT-A-CATH;  Surgeon: Rolm Bookbinder, MD;  Location: [facility];  Service: General;  Laterality: N/A;  . PORTACATH PLACEMENT  12/16/2009  . SAVORY DILATION N/A 02/02/2013   Procedure: SAVORY DILATION;  Surgeon: Lafayette Dragon, MD;  Location: [facility];  Service: Endoscopy;  Laterality: N/A;  . SMALL INTESTINE SURGERY  05/20/2001   ex. lap., resection of small bowel stricture; gastrostomy; insertion central line  .  TONSILLECTOMY  age 40  . TOTAL ABDOMINAL HYSTERECTOMY     complete  . TOTAL HIP ARTHROPLASTY  08/03/2011   Procedure: TOTAL HIP ARTHROPLASTY ANTERIOR APPROACH;  Surgeon: Mcarthur Rossetti, MD;  Location: [facility];  Service: Orthopedics;  Laterality: Right;     OB History   No obstetric history on file.      Home Medications    Prior to Admission medications   Medication Sig Start Date End Date Taking? Authorizing Provider  acetaminophen (TYLENOL) 325 MG tablet Take 2 tablets (650 mg total) by mouth every 4 (four) hours as needed. 07/01/17   Saverio Danker, PA-C  albuterol (VENTOLIN HFA) 108 (90 Base) MCG/ACT inhaler Inhale 2 puffs into the lungs every 6 (six) hours as needed for wheezing or shortness of breath. 09/10/18   Lauraine Rinne, NP  amLODipine (NORVASC) 2.5 MG tablet Take 1 tablet (2.5 mg total) by mouth daily. 09/05/18   Minus Breeding, MD  apixaban (ELIQUIS) 5 MG TABS tablet Take 1 tablet (5 mg total) by mouth 2 (two) times daily. 08/04/18   Ann Held, DO  atorvastatin (LIPITOR) 20 MG tablet Take 1 tablet (20 mg total) by mouth daily at 6 PM. 08/04/18   Carollee Herter, Alferd Apa, DO  carvedilol (COREG) 25 MG tablet TAKE 1 TABLET BY MOUTH TWICE DAILY 04/01/18   Minus Breeding, MD  denosumab (PROLIA) 60 MG/ML SOSY injection Inject 60 mg into the skin every 6 (six) months. 03/25/15   [provider]  furosemide (LASIX) 40 MG tablet TAKE ONE TABLET (40mg ) BY MOUTH IN THE MORNING 07/30/17   Minus Breeding, MD  gabapentin (NEURONTIN) 600 MG tablet Take 1 tablet by mouth 4 times daily 11/03/18   Carollee Herter, Kendrick Fries R, DO  lidocaine-prilocaine (EMLA) cream Apply 1 application topically as needed. 10/16/17   Volanda Napoleon, MD  nitrofurantoin (MACRODANTIN) 100 MG capsule Take 100 mg by mouth daily. 05/12/18   [provider]  nitroGLYCERIN (NITROSTAT) 0.4 MG SL tablet Place 1 tablet (0.4 mg total) under the tongue every 5 (five) minutes as needed. CHEST PAIN 05/15/17    Deboraha Sprang, MD  oxyCODONE-acetaminophen (PERCOCET) 10-325 MG tablet Take 1 tablet by mouth every 4 (four) hours as needed. PAIN 11/21/18   Ann Held, DO  pantoprazole (PROTONIX) 40 MG tablet Take 1 tablet by mouth twice daily 12/01/18   Gatha Mayer, MD  promethazine (PHENERGAN) 25 MG tablet TAKE ONE TABLET BY MOUTH EVERY 8 HOURS AS NEEDED FOR NAUSEA 09/18/17   Gatha Mayer, MD  tiZANidine (ZANAFLEX) 4 MG tablet Take 1 tablet by mouth three times daily as needed for muscle spasm 11/11/18   Roma Schanz  R, DO  triamcinolone ointment (KENALOG) 0.1 % APPLY TO SKIN TWICE DAILY FOR 14 DAYS 03/19/18   [provider]  umeclidinium bromide (INCRUSE ELLIPTA) 62.5 MCG/INH AEPB Inhale 1 puff into the lungs daily. 09/09/18   Lauraine Rinne, NP  zolpidem (AMBIEN) 5 MG tablet TAKE 1 TABLET BY MOUTH EVERY DAY AT BEDTIME AS NEEDED 11/13/18   Ann Held, DO  iron dextran complex (INFED) 50 MG/ML injection Please give infed infusion (no test dose needed) over 4 hours per pharmacy calculated dose. Ht: 5'2 Wt: 99 pounds Hgb: 12 12/20/10 06/05/11  Lafayette Dragon, MD    Family History Family History  Problem Relation Age of Onset  . Heart disease Father   . Stroke Mother   . Heart disease Mother   . Heart disease Brother   . Breast cancer Maternal Aunt   . Heart disease Paternal Aunt   . Diabetes Paternal Grandfather   . Colon cancer Neg Hx     Social History Social History   Tobacco Use  . Smoking status: Former Smoker    Packs/day: 1.00    Years: 20.00    Pack years: 20.00    Quit date: 03/19/1986    Years since quitting: 32.7  . Smokeless tobacco: Never Used  Substance Use Topics  . Alcohol use: No    Alcohol/week: 0.0 standard drinks  . Drug use: No     Allergies   Dabigatran etexilate mesylate, Augmentin [amoxicillin-pot clavulanate], Talwin [pentazocine], and Lactate   Review of Systems Review of Systems  Constitutional: Negative for fever.   HENT: Negative for sore throat.   Eyes: Negative for visual disturbance.  Respiratory: Negative for shortness of breath.   Cardiovascular: Negative for chest pain.  Gastrointestinal: Negative for abdominal pain.  Genitourinary: Negative for dysuria.  Musculoskeletal: Negative for neck pain.  Skin: Positive for wound. Negative for rash.  Neurological: Positive for headaches.     Physical Exam Updated Vital Signs BP 114/69 (BP Location: Right Arm)   Pulse 74   Temp 98 F (36.7 C) (Oral)   Resp 16   SpO2 100%   Physical Exam Vitals signs and nursing note reviewed.  Constitutional:      General: She is not in acute distress.    Appearance: She is well-developed.  HENT:     Head: Normocephalic.     Comments: She has a 3 cm vertical laceration in the middle of her forehead. Eyes:     Conjunctiva/sclera: Conjunctivae normal.  Neck:     Musculoskeletal: Neck supple.  Cardiovascular:     Rate and Rhythm: Normal rate and regular rhythm.     Heart sounds: No murmur.  Pulmonary:     Effort: Pulmonary effort is normal. No respiratory distress.     Breath sounds: Normal breath sounds.  Abdominal:     Palpations: Abdomen is soft.     Tenderness: There is no abdominal tenderness.  Musculoskeletal: Normal range of motion.        General: No deformity.     Right lower leg: No edema.     Left lower leg: No edema.  Skin:    General: Skin is warm and dry.     Capillary Refill: Capillary refill takes less than 2 seconds.  Neurological:     General: No focal deficit present.     Mental Status: She is alert and oriented to person, place, and time.      ED Treatments / Results  Labs (  all labs ordered are listed, but only abnormal results are displayed) Labs Reviewed - No data to display  EKG None  Radiology Ct Head Wo Contrast  Result Date: 12/09/2018 CLINICAL DATA:  Tripped over dog and fell striking her forehead on the kitchen counter top, laceration to forehead, denies  loss of consciousness; history atrial fibrillation, coronary artery disease, CHF, COPD, fibromyalgia, hypertension, primary dilated cardiomyopathy EXAM: CT HEAD WITHOUT CONTRAST TECHNIQUE: Contiguous axial images were obtained from the base of the skull through the vertex without intravenous contrast. Sagittal and coronal MPR images reconstructed from axial data set. COMPARISON:  06/19/2017 FINDINGS: Brain: Normal ventricular morphology. No midline shift or mass effect. Small vessel chronic ischemic changes of deep cerebral white matter. No intracranial hemorrhage, mass lesion or evidence of acute infarction. No extra-axial fluid collections. Lateral LEFT frontal dural calcification unchanged. Vascular: No hyperdense vessels Skull: Small medial LEFT frontal scalp hematoma.  Calvaria intact. Sinuses/Orbits: Clear Other: N/A IMPRESSION: Small vessel chronic ischemic changes of deep cerebral white matter. No acute intracranial abnormalities. Electronically Signed   By: Lavonia Dana M.D.   On: 12/09/2018 08:37    Procedures .Marland KitchenLaceration Repair  Date/Time: 12/09/2018 8:02 AM Performed by: Hayden Rasmussen, MD Authorized by: Hayden Rasmussen, MD   Consent:    Consent obtained:  Verbal   Consent given by:  Patient   Risks discussed:  Infection, pain, poor cosmetic result, poor wound healing and retained foreign body   Alternatives discussed:  No treatment and delayed treatment Anesthesia (see MAR for exact dosages):    Anesthesia method:  Local infiltration   Local anesthetic:  Lidocaine 2% WITH epi Laceration details:    Location:  Face   Face location:  Forehead   Length (cm):  4 Repair type:    Repair type:  Simple Pre-procedure details:    Preparation:  Patient was prepped and draped in usual sterile fashion Treatment:    Area cleansed with:  Saline   Amount of cleaning:  Standard Skin repair:    Repair method:  Sutures   Suture size:  6-0   Suture material:  Nylon   Suture technique:   Simple interrupted   Number of sutures:  7 Approximation:    Approximation:  Close Post-procedure details:    Dressing:  Non-adherent dressing   Patient tolerance of procedure:  Tolerated well, no immediate complications   (including critical care time)  Medications Ordered in ED Medications  lidocaine-EPINEPHrine (XYLOCAINE W/EPI) 2 %-1:200000 (PF) injection 10 mL (has no administration in time range)     Initial Impression / Assessment and Plan / ED Course  I have reviewed the triage vital signs and the nursing notes.  Pertinent labs & imaging results that were available during my care of the patient were reviewed by me and considered in my medical decision making (see chart for details).  Clinical Course as of Dec 09 1623  Tue Dec 08, 2172  [id number] 73 year old female on anticoagulation here after a trip and fall.  Face laceration forehead vertical approximately 4 cm total.  Getting head CT to make sure she does have any signs of intracranial bleeding although mentating well.  Differential includes face laceration, ICH, epidural.   [MB]  0813 CT reviewed by me, do not see any obvious ICH.  Awaiting radiology reading.   [MB]    Clinical Course User Index [MB] Hayden Rasmussen, MD        Final Clinical Impressions(s) / ED Diagnoses   Final diagnoses:  Fall, initial encounter  Injury of head, initial encounter  Laceration of forehead, initial encounter    ED Discharge Orders    None       Hayden Rasmussen, MD 12/09/18 1626

## 2018-12-09 NOTE — ED Triage Notes (Signed)
Pt tripped over her dog and fell, hitting her forehead on the kitchen countertop.  Vertical lac to forehead.

## 2018-12-09 NOTE — Discharge Instructions (Addendum)
You were seen in the emergency department for evaluation of injuries from a fall.  You had a CAT scan of your head that did not show any obvious fracture or bleeding internally.  You had a laceration of your forehead that was repaired with sutures that will need to be removed in 5 to 7 days.  Please follow-up with your primary care doctor or return here for suture removal.  Return sooner if any signs of infection or any other concerns.  Your tetanus was updated today.

## 2018-12-15 ENCOUNTER — Other Ambulatory Visit: Payer: Self-pay | Admitting: Family Medicine

## 2018-12-15 DIAGNOSIS — G4700 Insomnia, unspecified: Secondary | ICD-10-CM

## 2018-12-15 NOTE — Telephone Encounter (Signed)
Last written: 11/13/18 Last ov: 11/06/18 Next ov: 05/07/19 Contract: 11/06/18 UDS: WILL GET AT NEXT VISIT.

## 2018-12-16 ENCOUNTER — Other Ambulatory Visit: Payer: Self-pay

## 2018-12-16 ENCOUNTER — Encounter: Payer: Self-pay | Admitting: Internal Medicine

## 2018-12-16 ENCOUNTER — Ambulatory Visit (INDEPENDENT_AMBULATORY_CARE_PROVIDER_SITE_OTHER): Payer: HMO | Admitting: Internal Medicine

## 2018-12-16 VITALS — BP 118/66 | HR 69 | Temp 97.1°F | Resp 16 | Ht 60.0 in | Wt 94.0 lb

## 2018-12-16 DIAGNOSIS — Z4802 Encounter for removal of sutures: Secondary | ICD-10-CM

## 2018-12-16 DIAGNOSIS — F980 Enuresis not due to a substance or known physiological condition: Secondary | ICD-10-CM | POA: Diagnosis not present

## 2018-12-16 DIAGNOSIS — R350 Frequency of micturition: Secondary | ICD-10-CM | POA: Diagnosis not present

## 2018-12-16 DIAGNOSIS — S0990XD Unspecified injury of head, subsequent encounter: Secondary | ICD-10-CM

## 2018-12-16 NOTE — Progress Notes (Signed)
Subjective:    Patient ID: [name], female    DOB: Sep 26, 1945, 73 y.o.   MRN: [id number]  DOS:  12/16/2018 Type of visit - description: Acute Had sutures place few days ago, here for removal   Review of Systems  No fever chills No pain or bleeding No redness or discharge  Past Medical History:  Diagnosis Date  . Anemia   . Arthritis    right hip  . Atrial fibrillation ([place])   . CAD (coronary artery disease)    mild disease per cath in 2011  . CHF (congestive heart failure) ([place])   . COPD (chronic obstructive pulmonary disease) (HCC)    continuous O2- 2l  . Diverticulitis   . Elbow fracture, left aug 2012  . Erythropoietin deficiency anemia 06/19/2018  . Fibromyalgia    COUPLE OF TIMES A YEAR-PT HAS EPISODES OF CONFUSION-USUALLY INVOLVES DAY/NIGHT REVERSAL AND EPISODES OF TWITCHING AND FALLS--SEES DR. Jacelyn Grip - NEUROLOGIST-LAST SEEN 07/10/11  . Gastroparesis   . GERD (gastroesophageal reflux disease)   . H/O: GI bleed    from Pradaxa  . Hepatitis    hx of in high school   . HTN (hypertension)    under control; has been on med. x "years"  . Hx MRSA infection   . Hx of gastric ulcer   . Hx of stress fx aug 2011   right hip   . Iron malabsorption 08/14/2017  . LBBB (left bundle branch block)   . Oxygen dependent    2 liters via nasal cannula at all times  . Pacemaker    CRT therapy; followed by Dr. Caryl Comes  . Pleural effusion     s/p right thoracentesis 03/09  . Pneumonia - 03/09, 12/09, 02/10, 06/10   MOST RECENT FEB 2013  . Primary dilated cardiomyopathy (HCC)    EF 45 to 50% per echo in Jan 2012  . RLS (restless legs syndrome)   . Silent aspiration   . Small bowel obstruction ([place])   . Urinary incontinence     -INTERSTIM IMPLANT NOT FUNCTIONING PER PT  . Venous embolism and thrombosis of subclavian vein ([place])    after pacemaker insertion Oct 2011  . Vitamin B12 deficiency     Past Surgical History:  Procedure Laterality Date  . APPENDECTOMY    .  CARDIAC CATHETERIZATION  04/08/2006, 11/16/2009  . CHOLECYSTECTOMY N/A 06/27/2017   Procedure: OPEN CHOLECYSTECTOMY;  Surgeon: Ralene Ok, MD;  Location: [facility];  Service: General;  Laterality: N/A;  . COLECTOMY     Intestinal resection (5times)  . COLECTOMY  02/04/2000   ex. lap., intra-abd. subtotal colectomy with ileosigmoid colon anastomosies and lysis of adhesions  . CYSTOSCOPY  11/11/2008  . CYSTOSCOPY W/ RETROGRADES  11/11/2008   right  . CYSTOSCOPY WITH INJECTION  04/30/2006   transurethral collagen injection; incision vaginal stenosis  . ERCP N/A 06/23/2017   Procedure: ENDOSCOPIC RETROGRADE CHOLANGIOPANCREATOGRAPHY (ERCP);  Surgeon: Carol Ada, MD;  Location: [facility];  Service: Endoscopy;  Laterality: N/A;  . ERCP N/A 06/24/2017   Procedure: ENDOSCOPIC RETROGRADE CHOLANGIOPANCREATOGRAPHY (ERCP);  Surgeon: [name], MD;  Location[facility] END[facility]COPY;  Service: Endoscopy;  Laterality: N/A;  . ESOPHAGOGASTRODUODENOSCOPY N/A 02/02/2013   Procedure: ESOPHAGOGASTRODUODENOSCOPY (EGD);  Surgeon: Lafayette Dragon, MD;  Location: [facility];  Service: Endoscopy;  Laterality: N/A;  . EXPLORATORY LAPAROTOMY  04/27/2009   lysis of adhesions, gastrostomy tube  . GASTROCUTANEOUS FISTULA CLOSURE    . INTERSTIM IMPLANT PLACEMENT  05/28/2006 - stage I  06/05/2006 - stage II  . INTERSTIM IMPLANT PLACEMENT  02/05/2012   Procedure: [name] IMPLANT FIRST STAGE;  Surgeon: Reece Packer, MD;  Location: [facility];  Service: Urology;  Laterality: N/A;  Replacement of Interstim Lead     . INTERSTIM IMPLANT REVISION  03/06/2011   Procedure: REVISION OF [name];  Surgeon: Reece Packer, MD;  Location: [facility];  Service: Urology;  Laterality: N/A;  Replacement of Neurostimulator  . INTERSTIM IMPLANT REVISION  10/23/2007  . IR IMAGING GUIDED PORT INSERTION  10/14/2017  . PACEMAKER PLACEMENT  12/21/2009  . Peg removed     with complications  . PORT-A-CATH REMOVAL  07/27/2011   Procedure: REMOVAL  PORT-A-CATH;  Surgeon: Rolm Bookbinder, MD;  Location: [facility];  Service: General;  Laterality: N/A;  . PORTACATH PLACEMENT  12/16/2009  . SAVORY DILATION N/A 02/02/2013   Procedure: SAVORY DILATION;  Surgeon: Lafayette Dragon, MD;  Location: [facility];  Service: Endoscopy;  Laterality: N/A;  . SMALL INTESTINE SURGERY  05/20/2001   ex. lap., resection of small bowel stricture; gastrostomy; insertion central line  . TONSILLECTOMY  age 85  . TOTAL ABDOMINAL HYSTERECTOMY     complete  . TOTAL HIP ARTHROPLASTY  08/03/2011   Procedure: TOTAL HIP ARTHROPLASTY ANTERIOR APPROACH;  Surgeon: Mcarthur Rossetti, MD;  Location: [facility];  Service: Orthopedics;  Laterality: Right;    Social History   Socioeconomic History  . Marital status: Married    Spouse name: Not on file  . Number of children: 5  . Years of education: Not on file  . Highest education level: Not on file  Occupational History  . Occupation: disabled    Employer: RETIRED  Social Needs  . Financial resource strain: Not on file  . Food insecurity    Worry: Never true    Inability: Never true  . Transportation needs    Medical: No    Non-medical: No  Tobacco Use  . Smoking status: Former Smoker    Packs/day: 1.00    Years: 20.00    Pack years: 20.00    Quit date: 03/19/1986    Years since quitting: 32.7  . Smokeless tobacco: Never Used  Substance and Sexual Activity  . Alcohol use: No    Alcohol/week: 0.0 standard drinks  . Drug use: No  . Sexual activity: Yes  Lifestyle  . Physical activity    Days per week: Not on file    Minutes per session: Not on file  . Stress: Not on file  Relationships  . Social Herbalist on phone: Not on file    Gets together: Not on file    Attends religious service: Not on file    Active member of club or organization: Not on file    Attends meetings of clubs or organizations: Not on file    Relationship status: Not on file  . Intimate partner violence     Fear of current or ex partner: Not on file    Emotionally abused: Not on file    Physically abused: Not on file    Forced sexual activity: Not on file  Other Topics Concern  . Not on file  Social History Narrative   Occupation: Disabled   Married - second time - biologic daughter (in [place]) from first marriage - adopted 4 3 alive with second marriage   Alcohol Use - no   Illicit Drug Use - no   Patient is a former smoker,  quit 1988 x45yrs, <1ppd.    Daily Caffeine Use   01/20/2015 - updated      Allergies as of 12/16/2018      Reactions   Dabigatran Etexilate Mesylate Other (See Comments)   INTERNAL BLEEDING-pradaxa   Augmentin [amoxicillin-pot Clavulanate] Diarrhea, Nausea Only   Talwin [pentazocine] Other (See Comments)   hallucinations   Lactate Rash      Medication List       Accurate as of December 16, 2018  9:43 PM. If you have any questions, ask your nurse or doctor.        acetaminophen 325 MG tablet Commonly known as: TYLENOL Take 2 tablets (650 mg total) by mouth every 4 (four) hours as needed.   albuterol 108 (90 Base) MCG/ACT inhaler Commonly known as: VENTOLIN HFA Inhale 2 puffs into the lungs every 6 (six) hours as needed for wheezing or shortness of breath.   amLODipine 2.5 MG tablet Commonly known as: NORVASC Take 1 tablet (2.5 mg total) by mouth daily.   apixaban 5 MG Tabs tablet Commonly known as: Eliquis Take 1 tablet (5 mg total) by mouth 2 (two) times daily.   atorvastatin 20 MG tablet Commonly known as: LIPITOR Take 1 tablet (20 mg total) by mouth daily at 6 PM.   carvedilol 25 MG tablet Commonly known as: COREG TAKE 1 TABLET BY MOUTH TWICE DAILY   denosumab 60 MG/ML Sosy injection Commonly known as: PROLIA Inject 60 mg into the skin every 6 (six) months.   furosemide 40 MG tablet Commonly known as: LASIX TAKE ONE TABLET (40mg ) BY MOUTH IN THE MORNING   gabapentin 600 MG tablet Commonly known as: NEURONTIN Take 1 tablet by mouth  4 times daily   Incruse Ellipta 62.5 MCG/INH Aepb Generic drug: umeclidinium bromide Inhale 1 puff into the lungs daily.   lidocaine-prilocaine cream Commonly known as: EMLA Apply 1 application topically as needed.   nitrofurantoin 100 MG capsule Commonly known as: MACRODANTIN Take 100 mg by mouth daily.   nitroGLYCERIN 0.4 MG SL tablet Commonly known as: NITROSTAT Place 1 tablet (0.4 mg total) under the tongue every 5 (five) minutes as needed. CHEST PAIN   oxyCODONE-acetaminophen 10-325 MG tablet Commonly known as: PERCOCET Take 1 tablet by mouth every 4 (four) hours as needed. PAIN   pantoprazole 40 MG tablet Commonly known as: PROTONIX Take 1 tablet by mouth twice daily   promethazine 25 MG tablet Commonly known as: PHENERGAN TAKE ONE TABLET BY MOUTH EVERY 8 HOURS AS NEEDED FOR NAUSEA   tiZANidine 4 MG tablet Commonly known as: ZANAFLEX Take 1 tablet by mouth three times daily as needed for muscle spasm   triamcinolone ointment 0.1 % Commonly known as: KENALOG APPLY TO SKIN TWICE DAILY FOR 14 DAYS   zolpidem 5 MG tablet Commonly known as: AMBIEN TAKE 1 TABLET BY MOUTH EVERY DAY AT BEDTIME AS NEEDED           Objective:   Physical Exam HENT:     Head:     BP 118/66 (BP Location: Left Arm, Patient Position: Sitting, Cuff Size: Small)   Pulse 69   Temp (!) 97.1 F (36.2 C) (Temporal)   Resp 16   Ht 5' (1.524 m)   Wt 94 lb (42.6 kg)   SpO2 100%   BMI 18.36 kg/m   General:   Well developed, NAD, BMI noted. HEENT:  Normocephalic . Face symmetric, atraumatic  Neurologic:  alert & oriented X3.  Speech normal, gait appropriate  for age and unassisted Psych--  Cognition and judgment appear intact.  Cooperative with normal attention span and concentration.  Behavior appropriate. No anxious or depressed appearing.      Assessment    73 year old female, PMH includes COPD on oxygen, atrial fibrillation, on multiple medications including  anticoagulation presents for:  Suture removal, wound forehead. All except 1 of the stitches were removed, the most proximal to the nose was very small and I was not able to remove it after several attempts. Recommendation local care, most likely the remaining stitch will come out on its own.  If she develops a granuloma she will let me know.  Also watch for signs of infection.

## 2018-12-16 NOTE — Progress Notes (Signed)
Pre visit review using our clinic review tool, if applicable. No additional management support is needed unless otherwise documented below in the visit note. 

## 2018-12-17 ENCOUNTER — Inpatient Hospital Stay: Payer: HMO

## 2018-12-17 ENCOUNTER — Encounter: Payer: Self-pay | Admitting: Family

## 2018-12-17 ENCOUNTER — Telehealth: Payer: Self-pay | Admitting: Family

## 2018-12-17 ENCOUNTER — Inpatient Hospital Stay (HOSPITAL_BASED_OUTPATIENT_CLINIC_OR_DEPARTMENT_OTHER): Payer: HMO | Admitting: Family

## 2018-12-17 VITALS — BP 144/61 | HR 75 | Temp 97.1°F | Resp 17

## 2018-12-17 DIAGNOSIS — D508 Other iron deficiency anemias: Secondary | ICD-10-CM | POA: Diagnosis not present

## 2018-12-17 DIAGNOSIS — I4891 Unspecified atrial fibrillation: Secondary | ICD-10-CM | POA: Diagnosis not present

## 2018-12-17 DIAGNOSIS — D500 Iron deficiency anemia secondary to blood loss (chronic): Secondary | ICD-10-CM | POA: Diagnosis not present

## 2018-12-17 DIAGNOSIS — D631 Anemia in chronic kidney disease: Secondary | ICD-10-CM

## 2018-12-17 DIAGNOSIS — Z95828 Presence of other vascular implants and grafts: Secondary | ICD-10-CM

## 2018-12-17 DIAGNOSIS — J449 Chronic obstructive pulmonary disease, unspecified: Secondary | ICD-10-CM | POA: Diagnosis not present

## 2018-12-17 LAB — RETICULOCYTES
Immature Retic Fract: 3.9 % (ref 2.3–15.9)
RBC.: 4.31 MIL/uL (ref 3.87–5.11)
Retic Count, Absolute: 19 10*3/uL (ref 19.0–186.0)
Retic Ct Pct: 0.4 % (ref 0.4–3.1)

## 2018-12-17 LAB — CBC WITH DIFFERENTIAL (CANCER CENTER ONLY)
Abs Immature Granulocytes: 0.06 10*3/uL (ref 0.00–0.07)
Basophils Absolute: 0 10*3/uL (ref 0.0–0.1)
Basophils Relative: 1 %
Eosinophils Absolute: 0.1 10*3/uL (ref 0.0–0.5)
Eosinophils Relative: 2 %
HCT: 39.3 % (ref 36.0–46.0)
Hemoglobin: 12 g/dL (ref 12.0–15.0)
Immature Granulocytes: 1 %
Lymphocytes Relative: 8 %
Lymphs Abs: 0.5 10*3/uL — ABNORMAL LOW (ref 0.7–4.0)
MCH: 27.6 pg (ref 26.0–34.0)
MCHC: 30.5 g/dL (ref 30.0–36.0)
MCV: 90.3 fL (ref 80.0–100.0)
Monocytes Absolute: 0.5 10*3/uL (ref 0.1–1.0)
Monocytes Relative: 9 %
Neutro Abs: 4.8 10*3/uL (ref 1.7–7.7)
Neutrophils Relative %: 79 %
Platelet Count: 278 10*3/uL (ref 150–400)
RBC: 4.35 MIL/uL (ref 3.87–5.11)
RDW: 15.8 % — ABNORMAL HIGH (ref 11.5–15.5)
WBC Count: 6 10*3/uL (ref 4.0–10.5)
nRBC: 0 % (ref 0.0–0.2)

## 2018-12-17 LAB — CMP (CANCER CENTER ONLY)
ALT: 12 U/L (ref 0–44)
AST: 18 U/L (ref 15–41)
Albumin: 3.9 g/dL (ref 3.5–5.0)
Alkaline Phosphatase: 108 U/L (ref 38–126)
Anion gap: 6 (ref 5–15)
BUN: 17 mg/dL (ref 8–23)
CO2: 33 mmol/L — ABNORMAL HIGH (ref 22–32)
Calcium: 8.4 mg/dL — ABNORMAL LOW (ref 8.9–10.3)
Chloride: 100 mmol/L (ref 98–111)
Creatinine: 0.7 mg/dL (ref 0.44–1.00)
GFR, Est AFR Am: >60 mL/min (ref >60)
GFR, Estimated: >60 mL/min (ref >60)
Glucose, Bld: 129 mg/dL — ABNORMAL HIGH (ref 70–99)
Potassium: 4 mmol/L (ref 3.5–5.1)
Sodium: 139 mmol/L (ref 135–145)
Total Bilirubin: 0.4 mg/dL (ref 0.3–1.2)
Total Protein: 6.9 g/dL (ref 6.5–8.1)

## 2018-12-17 MED ORDER — HEPARIN SOD (PORK) LOCK FLUSH 100 UNIT/ML IV SOLN
500.0000 [IU] | Freq: Once | INTRAVENOUS | Status: AC
  Administered 2018-12-17: 500 [IU] via INTRAVENOUS
  Filled 2018-12-17: qty 5

## 2018-12-17 MED ORDER — SODIUM CHLORIDE 0.9% FLUSH
10.0000 mL | Freq: Once | INTRAVENOUS | Status: AC
  Administered 2018-12-17: 10 mL via INTRAVENOUS
  Filled 2018-12-17: qty 10

## 2018-12-17 NOTE — Telephone Encounter (Signed)
Called & spoke with  patient regarding appointments added per 9/30 lo s

## 2018-12-17 NOTE — Patient Instructions (Signed)
Tunneled Central Venous Catheter Flushing Guide  It is important to flush your tunneled central venous catheter each time you use it, both before and after you use it. Flushing your catheter will help prevent it from clogging. What are the risks? Risks may include:  Infection.  Air getting into the catheter and bloodstream. Supplies needed:  A clean pair of gloves.  A disinfecting wipe. Use an alcohol wipe, chlorhexidine wipe, or iodine wipe as told by your health care provider.  A 10 mL syringe that has been prefilled with saline solution.  An empty 10 mL syringe, if a substance called heparin was injected into your catheter. How to flush your catheter When you flush your catheter, make sure you follow any specific instructions from your health care provider or the manufacturer. These are general guidelines. Flushing your catheter before use If there is heparin in your catheter: 1. Wash your hands with soap and water. 2. Put on gloves. 3. Scrub the injection cap for a minimum of 15 seconds with a disinfecting wipe. 4. Unclamp the catheter. 5. Attach the empty syringe to the injection cap. 6. Pull the syringe plunger back and withdraw 10 mL of blood. 7. Place the syringe into an appropriate waste container. 8. Scrub the injection cap for 15 seconds with a disinfecting wipe. 9. Attach the prefilled syringe to the injection cap. 10. Flush the catheter by pushing the plunger forward until all the liquid from the syringe is in the catheter. 11. Remove the syringe from the injection cap. 12. Clamp the catheter. If there is no heparin in your catheter: 1. Wash your hands with soap and water. 2. Put on gloves. 3. Scrub the injection cap for 15 seconds with a disinfecting wipe. 4. Unclamp the catheter. 5. Attach the prefilled syringe to the injection cap. 6. Flush the catheter by pushing the plunger forward until 5 mL of the liquid from the syringe is in the catheter. 7. Pull back on  the syringe until you see blood in the catheter. 8. If you have been asked to collect any blood, follow your health care provider's instructions. Otherwise, flush the catheter with the rest of the solution from the syringe. 9. Remove the syringe from the injection cap. 10. Clamp the catheter.  Flushing your catheter after use 1. Wash your hands with soap and water. 2. Put on gloves. 3. Scrub the injection cap for 15 seconds with a disinfecting wipe. 4. Unclamp the catheter. 5. Attach the prefilled syringe to the injection cap. 6. Flush the catheter by pushing the plunger forward until all of the liquid from the syringe is in the catheter. 7. Remove the syringe from the injection cap. 8. Clamp the catheter. Problems and solutions  If blood cannot be completely cleared from the injection cap, you may need to have the injection cap replaced.  If the catheter is difficult to flush, use the pulsing method. The pulsing method involves pushing only a few milliliters of solution into the catheter at a time and pausing between pushes.  If you do not see blood in the catheter when you pull back on the syringe, change your body position, such as by raising your arms above your head. Take a deep breath and cough. Then, pull back on the syringe. If you still do not see blood, flush the catheter with a small amount of solution. Then, change positions again and take a breath or cough. Pull back on the syringe again. If you still do not see   blood, finish flushing the catheter and contact your health care provider. Do not use your catheter until your health care provider says it is okay. General tips  Have someone help you flush your catheter, if possible.  Do not force fluid through your catheter.  Do not use a syringe that is larger or smaller than 10 mL. Using a smaller syringe can make the catheter burst.  Do not use your catheter without flushing it first if it has heparin in it. Contact a health  care provider if:  You cannot see any blood in the catheter when you flush it before using it.  Your catheter is difficult to flush. Get help right away if:  You cannot flush the catheter.  The catheter leaks when you flush it or when there is fluid in it.  There are cracks or breaks in the catheter. Summary  It is important to flush your tunneled central venous catheter each time you use it, both before and after you use it.  Scrub the injection cap for 15 seconds with a disinfecting wipe before and after you flush it.  When you flush your catheter, make sure you follow any specific instructions from your health care provider or the manufacturer.  Get help right away if you cannot flush the catheter. This information is not intended to replace advice given to you by your health care provider. Make sure you discuss any questions you have with your health care provider. Document Released: 02/22/2011 Document Revised: 05/21/2018 Document Reviewed: 05/21/2018 Elsevier Patient Education  2020 Elsevier Inc.  

## 2018-12-17 NOTE — Progress Notes (Signed)
Hematology and Oncology Follow Up Visit  [name] [id number] 1945-12-21 73 y.o. 12/17/2018   Principle Diagnosis:  Iron deficiency anemia Erythropoietin deficient anemia  Current Therapy:   IV iron as indicated Aranesp 300 mcg sq q 3 wks for hgb < 11   Interim History:  [name] is here today for follow-up. She is doing well but did trip over her dog last week and hit her head on a dresser. She had to have stitches in the scalp and middle of her forehead. She has some mild bruising around the site and the wound is healing nicely.  She is on Eliquis bled some with the wound but was able to get it to clot. No other episodes of blood loss. No bruising or petechiae. Hgb is stable at 12.0, MCV 90.  Her SOB is stable on 2-3 L supplemental O2.  No fever, chills, n/v, cough, rash, dizziness, chest pain, palpitations, abdominal pain or changes in bowel or bladder habits.  No swelling or tenderness in her extremities. She still has occasional numbness and tingling in her toes and fingertips.  She has maintained a good appetite and is staying well hydrated. Her weight is stable.   ECOG Performance Status: 1 - Symptomatic but completely ambulatory  Medications:  Allergies as of 12/17/2018      Reactions   Dabigatran Etexilate Mesylate Other (See Comments)   INTERNAL BLEEDING-pradaxa   Augmentin [amoxicillin-pot Clavulanate] Diarrhea, Nausea Only   Talwin [pentazocine] Other (See Comments)   hallucinations   Lactate Rash      Medication List       Accurate as of December 17, 2018  2:29 PM. If you have any questions, ask your nurse or doctor.        acetaminophen 325 MG tablet Commonly known as: TYLENOL Take 2 tablets (650 mg total) by mouth every 4 (four) hours as needed.   albuterol 108 (90 Base) MCG/ACT inhaler Commonly known as: VENTOLIN HFA Inhale 2 puffs into the lungs every 6 (six) hours as needed for wheezing or shortness of breath.   amLODipine 2.5 MG tablet  Commonly known as: NORVASC Take 1 tablet (2.5 mg total) by mouth daily.   apixaban 5 MG Tabs tablet Commonly known as: Eliquis Take 1 tablet (5 mg total) by mouth 2 (two) times daily.   atorvastatin 20 MG tablet Commonly known as: LIPITOR Take 1 tablet (20 mg total) by mouth daily at 6 PM.   carvedilol 25 MG tablet Commonly known as: COREG TAKE 1 TABLET BY MOUTH TWICE DAILY   denosumab 60 MG/ML Sosy injection Commonly known as: PROLIA Inject 60 mg into the skin every 6 (six) months.   furosemide 40 MG tablet Commonly known as: LASIX TAKE ONE TABLET (40mg ) BY MOUTH IN THE MORNING   gabapentin 600 MG tablet Commonly known as: NEURONTIN Take 1 tablet by mouth 4 times daily   Incruse Ellipta 62.5 MCG/INH Aepb Generic drug: umeclidinium bromide Inhale 1 puff into the lungs daily.   lidocaine-prilocaine cream Commonly known as: EMLA Apply 1 application topically as needed.   nitrofurantoin 100 MG capsule Commonly known as: MACRODANTIN Take 100 mg by mouth daily.   nitroGLYCERIN 0.4 MG SL tablet Commonly known as: NITROSTAT Place 1 tablet (0.4 mg total) under the tongue every 5 (five) minutes as needed. CHEST PAIN   oxyCODONE-acetaminophen 10-325 MG tablet Commonly known as: PERCOCET Take 1 tablet by mouth every 4 (four) hours as needed. PAIN   pantoprazole 40 MG tablet Commonly  known as: PROTONIX Take 1 tablet by mouth twice daily   promethazine 25 MG tablet Commonly known as: PHENERGAN TAKE ONE TABLET BY MOUTH EVERY 8 HOURS AS NEEDED FOR NAUSEA   tiZANidine 4 MG tablet Commonly known as: ZANAFLEX Take 1 tablet by mouth three times daily as needed for muscle spasm   triamcinolone ointment 0.1 % Commonly known as: KENALOG APPLY TO SKIN TWICE DAILY FOR 14 DAYS   zolpidem 5 MG tablet Commonly known as: AMBIEN TAKE 1 TABLET BY MOUTH EVERY DAY AT BEDTIME AS NEEDED       Allergies:  Allergies  Allergen Reactions  . Dabigatran Etexilate Mesylate Other (See  Comments)    INTERNAL BLEEDING-pradaxa  . Augmentin [Amoxicillin-Pot Clavulanate] Diarrhea and Nausea Only  . Talwin [Pentazocine] Other (See Comments)    hallucinations  . Lactate Rash    Past Medical History, Surgical history, Social history, and Family History were reviewed and updated.  Review of Systems: All other 10 point review of systems is negative.   Physical Exam:  temporal temperature is 97.1 F (36.2 C) (abnormal). Her blood pressure is 144/61 (abnormal) and her pulse is 75. Her respiration is 17 and oxygen saturation is 100%.   Wt Readings from Last 3 Encounters:  12/16/18 94 lb (42.6 kg)  12/09/18 89 lb 3.2 oz (40.5 kg)  11/17/18 87 lb 1.3 oz (39.5 kg)    Ocular: Sclerae unicteric, pupils equal, round and reactive to light Ear-nose-throat: Oropharynx clear, dentition fair Lymphatic: No cervical or supraclavicular adenopathy Lungs no rales or rhonchi, good excursion bilaterally Heart regular rate and rhythm, no murmur appreciated Abd soft, nontender, positive bowel sounds, no liver or spleen tip palpated on exam, no fluid wave  MSK no focal spinal tenderness, no joint edema Neuro: non-focal, well-oriented, appropriate affect Breasts: Deferred   Lab Results  Component Value Date   WBC 6.0 12/17/2018   HGB 12.0 12/17/2018   HCT 39.3 12/17/2018   MCV 90.3 12/17/2018   PLT 278 12/17/2018   Lab Results  Component Value Date   FERRITIN 641 (H) 11/17/2018   IRON 45 11/17/2018   TIBC 226 (L) 11/17/2018   UIBC 182 11/17/2018   IRONPCTSAT 20 (L) 11/17/2018   Lab Results  Component Value Date   RETICCTPCT 0.4 12/17/2018   RBC 4.35 12/17/2018   RBC 4.31 12/17/2018   RETICCTABS 64.6 04/01/2009   No results found for: KPAFRELGTCHN, LAMBDASER, KAPLAMBRATIO No results found for: IGGSERUM, IGA, IGMSERUM No results found for: Kathrynn Ducking, MSPIKE, SPEI   Chemistry      Component Value Date/Time   NA 139 11/17/2018  1045   K 4.2 11/17/2018 1045   CL 100 11/17/2018 1045   CO2 32 11/17/2018 1045   BUN 18 11/17/2018 1045   CREATININE 0.89 11/17/2018 1045   CREATININE 0.91 03/16/2016 1539      Component Value Date/Time   CALCIUM 8.4 (L) 11/17/2018 1045   ALKPHOS 100 11/17/2018 1045   AST 15 11/17/2018 1045   ALT 11 11/17/2018 1045   BILITOT 0.3 11/17/2018 1045       Impression and Plan: Ms. Hett is a very pleasant 73 yo caucasian female with iron deficiency anemia secondary to malabsorption and possible GI blood loss with Eliquis. We will see what her iron studies show and bring her back in for infusion if needed.  We will plan to see her back in another month.  She will contact our office with any questions or  concerns. We can certainly see her sooner if needed.   Laverna Peace, NP 9/30/20202:29 PM

## 2018-12-18 ENCOUNTER — Ambulatory Visit (INDEPENDENT_AMBULATORY_CARE_PROVIDER_SITE_OTHER): Payer: HMO | Admitting: *Deleted

## 2018-12-18 DIAGNOSIS — I428 Other cardiomyopathies: Secondary | ICD-10-CM

## 2018-12-18 LAB — IRON AND TIBC
Iron: 81 ug/dL (ref 41–142)
Saturation Ratios: 41 % (ref 21–57)
TIBC: 198 ug/dL — ABNORMAL LOW (ref 236–444)
UIBC: 117 ug/dL — ABNORMAL LOW (ref 120–384)

## 2018-12-18 LAB — FERRITIN: Ferritin: 1077 ng/mL — ABNORMAL HIGH (ref 11–307)

## 2018-12-20 LAB — CUP PACEART REMOTE DEVICE CHECK
Battery Remaining Longevity: 4 mo
Battery Voltage: 2.81 V
Brady Statistic AP VP Percent: 0.62 %
Brady Statistic AP VS Percent: 0.01 %
Brady Statistic AS VP Percent: 99.32 %
Brady Statistic AS VS Percent: 0.05 %
Brady Statistic RA Percent Paced: 0.61 %
Brady Statistic RV Percent Paced: 99.93 %
Date Time Interrogation Session: 20201003015240
Implantable Lead Implant Date: 20111005
Implantable Lead Implant Date: 20111005
Implantable Lead Implant Date: 20111005
Implantable Lead Location: 753858
Implantable Lead Location: 753859
Implantable Lead Location: 753860
Implantable Lead Model: 4196
Implantable Lead Model: 5076
Implantable Lead Model: 5076
Implantable Pulse Generator Implant Date: 20111005
Lead Channel Impedance Value: 323 Ohm
Lead Channel Impedance Value: 380 Ohm
Lead Channel Impedance Value: 399 Ohm
Lead Channel Impedance Value: 418 Ohm
Lead Channel Impedance Value: 437 Ohm
Lead Channel Impedance Value: 475 Ohm
Lead Channel Impedance Value: 475 Ohm
Lead Channel Impedance Value: 494 Ohm
Lead Channel Impedance Value: 646 Ohm
Lead Channel Pacing Threshold Amplitude: 0.5 V
Lead Channel Pacing Threshold Amplitude: 0.5 V
Lead Channel Pacing Threshold Pulse Width: 0.4 ms
Lead Channel Pacing Threshold Pulse Width: 0.4 ms
Lead Channel Sensing Intrinsic Amplitude: 1 mV
Lead Channel Sensing Intrinsic Amplitude: 1 mV
Lead Channel Sensing Intrinsic Amplitude: 4.875 mV
Lead Channel Sensing Intrinsic Amplitude: 4.875 mV
Lead Channel Setting Pacing Amplitude: 1.25 V
Lead Channel Setting Pacing Amplitude: 2 V
Lead Channel Setting Pacing Amplitude: 2.5 V
Lead Channel Setting Pacing Pulse Width: 0.4 ms
Lead Channel Setting Pacing Pulse Width: 0.8 ms
Lead Channel Setting Sensing Sensitivity: 0.9 mV

## 2018-12-22 ENCOUNTER — Other Ambulatory Visit: Payer: Self-pay | Admitting: Family Medicine

## 2018-12-22 DIAGNOSIS — R52 Pain, unspecified: Secondary | ICD-10-CM

## 2018-12-22 MED ORDER — OXYCODONE-ACETAMINOPHEN 10-325 MG PO TABS: 1.0000 | ORAL_TABLET | ORAL | 0 refills | Status: DC | PRN

## 2018-12-22 NOTE — Telephone Encounter (Signed)
Requested medication (s) are due for refill today: yes  Requested medication (s) are on the active medication list: yes  Last refill:  11/21/2018  Future visit scheduled: yes  Notes to clinic:  Refill cannot be delegated    Requested Prescriptions  Pending Prescriptions Disp Refills   oxyCODONE-acetaminophen (PERCOCET) 10-325 MG tablet 120 tablet 0    Sig: Take 1 tablet by mouth every 4 (four) hours as needed. PAIN     Not Delegated - Analgesics:  Opioid Agonist Combinations Failed - 12/22/2018  9:13 AM      Failed - This refill cannot be delegated      Failed - Urine Drug Screen completed in last 360 days.      Passed - Valid encounter within last 6 months    Recent Outpatient Visits          6 days ago Closed wound of head, subsequent encounter   [facility] [place] [facility], MD   1 month ago Need for influenza vaccination   [facility] [place], DO   4 months ago Hyperlipidemia LDL goal <100   [facility] [place], DO   1 year ago Bad odor of urine   [facility] [place], DO   1 year ago Anemia, chronic disease   [facility] [place], DO      Future Appointments            In 4 months [place], [place] at [employer] [facility], [place]

## 2018-12-22 NOTE — Telephone Encounter (Signed)
oxyCODONE-acetaminophen (PERCOCET) 10-325 MG tablet   Send to [facility]

## 2018-12-22 NOTE — Telephone Encounter (Signed)
Requesting: Percocet Contract: 11/06/2018 UDS: 10/2018/2020 Last OV: 12/16/2018 w/ [facility] Next OV: 05/07/2019 Last Refill: 11/21/2018, #[id number] RF Database:   Please advise

## 2018-12-25 ENCOUNTER — Encounter: Payer: Self-pay | Admitting: Cardiology

## 2018-12-25 ENCOUNTER — Telehealth: Payer: Self-pay | Admitting: Family

## 2018-12-25 ENCOUNTER — Other Ambulatory Visit: Payer: Self-pay | Admitting: Family Medicine

## 2018-12-25 DIAGNOSIS — R52 Pain, unspecified: Secondary | ICD-10-CM

## 2018-12-25 NOTE — Progress Notes (Signed)
Remote pacemaker transmission.   

## 2018-12-25 NOTE — Telephone Encounter (Signed)
Called and spoke with patient about date/time change of appointment for 10/30 due to provider PAL.  Patient is ok with new date/ time  ° °

## 2018-12-31 ENCOUNTER — Other Ambulatory Visit: Payer: Self-pay | Admitting: Family Medicine

## 2018-12-31 DIAGNOSIS — R52 Pain, unspecified: Secondary | ICD-10-CM

## 2019-01-01 ENCOUNTER — Other Ambulatory Visit: Payer: Self-pay

## 2019-01-01 NOTE — Patient Outreach (Signed)
  [facility]) Care Management Chronic Special Needs Program  01/01/2019  Name: [name] DOB: June 20, 1945  MRN: [id number]  [name] is enrolled in a chronic special needs plan for Heart Failure. Reviewed and updated care plan.  Subjective: States she has been doing good but she does have an occasional cough.  States she is using her O 2 at 3 L/min.  States she is weighting everyday and her weights range 85-90 lbs,  states she does not like nutritional supplements.  States she will need to have her pacemaker changed later this year or early next year.  States she had one fall where she tripped over her dog and she needed to get stiches in her head.  States she got her Flu shot in August  Goals Addressed            This Visit's Progress   . Client understands the importance of follow-up with providers by attending scheduled visits   On track   . COMPLETED: Client will report abillity to obtain Medications within 3 months   On track    Receiving pharmacy assistance     . Client will report no worsening of symptoms of Atrial Fibrillation within the next 6 months(continued 01/01/19)   On track   . Client will report weighing and recording weights daily within next 6 months(continue 01/01/19)   On track   . Client will verbalize knowledge of chronic lung disease as evidenced by no ED visits or Inpatient stays related to chronic lung disease    On track   . Client will verbalize knowledge of self management of Hypertension as evidences by BP reading of 140/90 or less; or as defined by provider   On track   . Maintain timely refills of Heart Failure medication as prescribed within the year    On track   . COMPLETED: Obtain annual  Lipid Profile, LDL-C       Completed 08/18/18    . COMPLETED: Visit Primary Care Provider or Cardiologist at least 2 times per year       Visits on 09/05/18, 11/06/18     Client reports weighing daily with very little variation in her  weights Client is now receiving pharmacy assistance with medications Reinforced s/s of HF to call MD Reinforced to follow a low sodium diet  Reviewed fall and safety precautions Reviewed number for 24-hour nurse Line Reviewed COVID 19 precautions Plan:  Send successful outreach letter with a copy of their individualized care plan and Send individual care plan to provider  Chronic care management coordinator will outreach in:  4-6 Months  Will refer to:     Peter Garter RN, [name], [place] Management Coordinator [place] Management [phone]

## 2019-01-02 ENCOUNTER — Telehealth: Payer: Self-pay | Admitting: Pulmonary Disease

## 2019-01-02 NOTE — Telephone Encounter (Signed)
ATC [employer] x3 - all rewps busy.  LMOM TCB x1.  The last time patient was seen in the office for F2F was 7.9.2020 and the last time sats were documented was 1.6.2020, tho in the 7/6 visit patient noted that she had to increase her O2.

## 2019-01-05 ENCOUNTER — Other Ambulatory Visit: Payer: Self-pay | Admitting: Cardiology

## 2019-01-05 NOTE — Telephone Encounter (Signed)
OV notes have been printed and faxed.   Will close encounter.

## 2019-01-05 NOTE — Telephone Encounter (Signed)
Maple from [place] returned call.  [phone]

## 2019-01-09 ENCOUNTER — Other Ambulatory Visit: Payer: Self-pay

## 2019-01-09 ENCOUNTER — Other Ambulatory Visit: Payer: Self-pay | Admitting: Family Medicine

## 2019-01-09 DIAGNOSIS — G4700 Insomnia, unspecified: Secondary | ICD-10-CM

## 2019-01-09 NOTE — Patient Outreach (Signed)
  [place] [facility]) Care Management Chronic Special Needs Program    01/09/2019  Name: [name], DOB: 1946/01/03  MRN: [id number]   [name] is enrolled in a chronic special needs plan for Heart Failure. Client called concerning a form she received from [employer] with a question about her pharmacy coverage and Fay [employer] and she does not know how to respond to the questions Referred client to [place] to assist her with the form Also directed to contact [place] pharmacist Denyse Amass [facility] if she has any further questions about her pharmacy assistance Voiced understanding Plan to follow up as per scheduled   [place], [name], [place] Management [phone]

## 2019-01-12 NOTE — Telephone Encounter (Signed)
Last written: 12/15/18 Last ov: 11/06/18 Next ov: 05/07/19 Contract: 11/06/18 UDS: will get at 05/07/19 visit

## 2019-01-13 ENCOUNTER — Inpatient Hospital Stay (HOSPITAL_BASED_OUTPATIENT_CLINIC_OR_DEPARTMENT_OTHER): Payer: HMO | Admitting: Family

## 2019-01-13 ENCOUNTER — Other Ambulatory Visit: Payer: Self-pay

## 2019-01-13 ENCOUNTER — Inpatient Hospital Stay: Payer: HMO

## 2019-01-13 ENCOUNTER — Inpatient Hospital Stay: Payer: HMO | Attending: Family

## 2019-01-13 VITALS — BP 115/55 | HR 66 | Temp 98.0°F | Resp 18

## 2019-01-13 DIAGNOSIS — D631 Anemia in chronic kidney disease: Secondary | ICD-10-CM | POA: Diagnosis not present

## 2019-01-13 DIAGNOSIS — N189 Chronic kidney disease, unspecified: Secondary | ICD-10-CM | POA: Insufficient documentation

## 2019-01-13 DIAGNOSIS — Z95828 Presence of other vascular implants and grafts: Secondary | ICD-10-CM

## 2019-01-13 DIAGNOSIS — Z9981 Dependence on supplemental oxygen: Secondary | ICD-10-CM | POA: Diagnosis not present

## 2019-01-13 DIAGNOSIS — K909 Intestinal malabsorption, unspecified: Secondary | ICD-10-CM | POA: Insufficient documentation

## 2019-01-13 DIAGNOSIS — D500 Iron deficiency anemia secondary to blood loss (chronic): Secondary | ICD-10-CM

## 2019-01-13 DIAGNOSIS — R0602 Shortness of breath: Secondary | ICD-10-CM | POA: Diagnosis not present

## 2019-01-13 DIAGNOSIS — D508 Other iron deficiency anemias: Secondary | ICD-10-CM | POA: Insufficient documentation

## 2019-01-13 LAB — CMP (CANCER CENTER ONLY)
ALT: 14 U/L (ref 0–44)
AST: 20 U/L (ref 15–41)
Albumin: 4.1 g/dL (ref 3.5–5.0)
Alkaline Phosphatase: 102 U/L (ref 38–126)
Anion gap: 6 (ref 5–15)
BUN: 18 mg/dL (ref 8–23)
CO2: 32 mmol/L (ref 22–32)
Calcium: 8.6 mg/dL — ABNORMAL LOW (ref 8.9–10.3)
Chloride: 100 mmol/L (ref 98–111)
Creatinine: 0.85 mg/dL (ref 0.44–1.00)
GFR, Est AFR Am: >60 mL/min (ref >60)
GFR, Estimated: >60 mL/min (ref >60)
Glucose, Bld: 104 mg/dL — ABNORMAL HIGH (ref 70–99)
Potassium: 4.4 mmol/L (ref 3.5–5.1)
Sodium: 138 mmol/L (ref 135–145)
Total Bilirubin: 0.3 mg/dL (ref 0.3–1.2)
Total Protein: 6.9 g/dL (ref 6.5–8.1)

## 2019-01-13 LAB — CBC WITH DIFFERENTIAL (CANCER CENTER ONLY)
Abs Immature Granulocytes: 0.02 10*3/uL (ref 0.00–0.07)
Basophils Absolute: 0 10*3/uL (ref 0.0–0.1)
Basophils Relative: 1 %
Eosinophils Absolute: 0.2 10*3/uL (ref 0.0–0.5)
Eosinophils Relative: 4 %
HCT: 35.9 % — ABNORMAL LOW (ref 36.0–46.0)
Hemoglobin: 11.2 g/dL — ABNORMAL LOW (ref 12.0–15.0)
Immature Granulocytes: 0 %
Lymphocytes Relative: 9 %
Lymphs Abs: 0.5 10*3/uL — ABNORMAL LOW (ref 0.7–4.0)
MCH: 28.5 pg (ref 26.0–34.0)
MCHC: 31.2 g/dL (ref 30.0–36.0)
MCV: 91.3 fL (ref 80.0–100.0)
Monocytes Absolute: 0.6 10*3/uL (ref 0.1–1.0)
Monocytes Relative: 9 %
Neutro Abs: 4.9 10*3/uL (ref 1.7–7.7)
Neutrophils Relative %: 77 %
Platelet Count: 237 10*3/uL (ref 150–400)
RBC: 3.93 MIL/uL (ref 3.87–5.11)
RDW: 15.7 % — ABNORMAL HIGH (ref 11.5–15.5)
WBC Count: 6.4 10*3/uL (ref 4.0–10.5)
nRBC: 0 % (ref 0.0–0.2)

## 2019-01-13 LAB — RETICULOCYTES
Immature Retic Fract: 5.1 % (ref 2.3–15.9)
RBC.: 3.88 MIL/uL (ref 3.87–5.11)
Retic Count, Absolute: 26.8 10*3/uL (ref 19.0–186.0)
Retic Ct Pct: 0.7 % (ref 0.4–3.1)

## 2019-01-13 MED ORDER — SODIUM CHLORIDE 0.9% FLUSH
10.0000 mL | INTRAVENOUS | Status: DC | PRN
  Administered 2019-01-13: 10 mL via INTRAVENOUS
  Filled 2019-01-13: qty 10

## 2019-01-13 MED ORDER — HEPARIN SOD (PORK) LOCK FLUSH 100 UNIT/ML IV SOLN
500.0000 [IU] | Freq: Once | INTRAVENOUS | Status: AC
  Administered 2019-01-13: 14:00:00 500 [IU] via INTRAVENOUS
  Filled 2019-01-13: qty 5

## 2019-01-13 NOTE — Progress Notes (Signed)
Hematology and Oncology Follow Up Visit  [name] [id number] 1946-01-01 73 y.o. 01/13/2019   Principle Diagnosis:  Iron deficiency anemia Erythropoietin deficient anemia  Current Therapy:   IV iron as indicated Aranesp 300 mcg sq q 3 wks for hgb < 11   Interim History:  [name] is here today for follow-up. She has had some fatigue at times.  Her SOB has been a little worse at times with the recent changes in weather. She says this always gives her a hard time. She is doing well on supplemental O2 24 hours a day.  She has not noted any blood loss. No bruising or petechiae.  She has not had any more falls and no syncopal episodes. Her laceration to the middle of her forehead has healed nicely and is barely noticeable.  She has had no fever, chills, n/v, cough, rash, dizziness, chest pain, palpitations, abdominal pain or changes in bowel or bladder habits.  No swelling or tenderness in her extremities.  The numbness and tingling in her hands and feet continues to come and go.  She states that she is eating well and staying hydrated.   ECOG Performance Status: 1 - Symptomatic but completely ambulatory  Medications:  Allergies as of 01/13/2019      Reactions   Dabigatran Etexilate Mesylate Other (See Comments)   INTERNAL BLEEDING-pradaxa   Augmentin [amoxicillin-pot Clavulanate] Diarrhea, Nausea Only   Talwin [pentazocine] Other (See Comments)   hallucinations   Lactate Rash      Medication List       Accurate as of January 13, 2019  2:38 PM. If you have any questions, ask your nurse or doctor.        acetaminophen 325 MG tablet Commonly known as: TYLENOL Take 2 tablets (650 mg total) by mouth every 4 (four) hours as needed.   albuterol 108 (90 Base) MCG/ACT inhaler Commonly known as: VENTOLIN HFA Inhale 2 puffs into the lungs every 6 (six) hours as needed for wheezing or shortness of breath.   amLODipine 2.5 MG tablet Commonly known as: NORVASC Take 1  tablet (2.5 mg total) by mouth daily.   apixaban 5 MG Tabs tablet Commonly known as: Eliquis Take 1 tablet (5 mg total) by mouth 2 (two) times daily.   atorvastatin 20 MG tablet Commonly known as: LIPITOR Take 1 tablet (20 mg total) by mouth daily at 6 PM.   carvedilol 25 MG tablet Commonly known as: COREG Take 1 tablet by mouth twice daily   denosumab 60 MG/ML Sosy injection Commonly known as: PROLIA Inject 60 mg into the skin every 6 (six) months.   furosemide 40 MG tablet Commonly known as: LASIX TAKE ONE TABLET (40mg ) BY MOUTH IN THE MORNING   gabapentin 600 MG tablet Commonly known as: NEURONTIN Take 1 tablet by mouth 4 times daily   Incruse Ellipta 62.5 MCG/INH Aepb Generic drug: umeclidinium bromide Inhale 1 puff into the lungs daily.   lidocaine-prilocaine cream Commonly known as: EMLA Apply 1 application topically as needed.   nitrofurantoin 100 MG capsule Commonly known as: MACRODANTIN Take 100 mg by mouth daily.   nitroGLYCERIN 0.4 MG SL tablet Commonly known as: NITROSTAT Place 1 tablet (0.4 mg total) under the tongue every 5 (five) minutes as needed. CHEST PAIN   oxyCODONE-acetaminophen 10-325 MG tablet Commonly known as: PERCOCET Take 1 tablet by mouth every 4 (four) hours as needed. PAIN   pantoprazole 40 MG tablet Commonly known as: PROTONIX Take 1 tablet by mouth  twice daily   promethazine 25 MG tablet Commonly known as: PHENERGAN TAKE ONE TABLET BY MOUTH EVERY 8 HOURS AS NEEDED FOR NAUSEA   tiZANidine 4 MG tablet Commonly known as: ZANAFLEX Take 1 tablet by mouth three times daily as needed for muscle spasm   triamcinolone ointment 0.1 % Commonly known as: KENALOG APPLY TO SKIN TWICE DAILY FOR 14 DAYS   zolpidem 5 MG tablet Commonly known as: AMBIEN TAKE 1 TABLET BY MOUTH EVERY DAY AT BEDTIME AS NEEDED       Allergies:  Allergies  Allergen Reactions  . Dabigatran Etexilate Mesylate Other (See Comments)    INTERNAL  BLEEDING-pradaxa  . Augmentin [Amoxicillin-Pot Clavulanate] Diarrhea and Nausea Only  . Talwin [Pentazocine] Other (See Comments)    hallucinations  . Lactate Rash    Past Medical History, Surgical history, Social history, and Family History were reviewed and updated.  Review of Systems: All other 10 point review of systems is negative.   Physical Exam:  vitals were not taken for this visit.   Wt Readings from Last 3 Encounters:  12/16/18 94 lb (42.6 kg)  12/09/18 89 lb 3.2 oz (40.5 kg)  11/17/18 87 lb 1.3 oz (39.5 kg)    Ocular: Sclerae unicteric, pupils equal, round and reactive to light Ear-nose-throat: Oropharynx clear, dentition fair Lymphatic: No cervical or supraclavicular adenopathy Lungs no rales or rhonchi, good excursion bilaterally Heart regular rate and rhythm, no murmur appreciated Abd soft, nontender, positive bowel sounds, no liver or spleen tip palpated on exam, no fluid wave  MSK no focal spinal tenderness, no joint edema Neuro: non-focal, well-oriented, appropriate affect Breasts: Deferred   Lab Results  Component Value Date   WBC 6.4 01/13/2019   HGB 11.2 (L) 01/13/2019   HCT 35.9 (L) 01/13/2019   MCV 91.3 01/13/2019   PLT 237 01/13/2019   Lab Results  Component Value Date   FERRITIN 1,077 (H) 12/17/2018   IRON 81 12/17/2018   TIBC 198 (L) 12/17/2018   UIBC 117 (L) 12/17/2018   IRONPCTSAT 41 12/17/2018   Lab Results  Component Value Date   RETICCTPCT 0.7 01/13/2019   RBC 3.88 01/13/2019   RETICCTABS 64.6 04/01/2009   No results found for: KPAFRELGTCHN, LAMBDASER, KAPLAMBRATIO No results found for: IGGSERUM, IGA, IGMSERUM No results found for: Kathrynn Ducking, MSPIKE, SPEI   Chemistry      Component Value Date/Time   NA 138 01/13/2019 1335   K 4.4 01/13/2019 1335   CL 100 01/13/2019 1335   CO2 32 01/13/2019 1335   BUN 18 01/13/2019 1335   CREATININE 0.85 01/13/2019 1335   CREATININE 0.91  03/16/2016 1539      Component Value Date/Time   CALCIUM 8.6 (L) 01/13/2019 1335   ALKPHOS 102 01/13/2019 1335   AST 20 01/13/2019 1335   ALT 14 01/13/2019 1335   BILITOT 0.3 01/13/2019 1335       Impression and Plan: [name] is a very pleasant 73yo caucasian female with iron deficiency anemia secondary to malabsorption and possible GI blood loss with Eliquis. She is symptomatic with fatigue.  We will see what her iron studies show and bring her back in for infusion if needed.  We will plan to see her back in a month.  She will contact our office with any questions or concerns. We can certainly see her sooner if needed.   Laverna Peace, NP 10/27/20202:38 PM

## 2019-01-14 LAB — IRON AND TIBC
Iron: 73 ug/dL (ref 41–142)
Saturation Ratios: 35 % (ref 21–57)
TIBC: 206 ug/dL — ABNORMAL LOW (ref 236–444)
UIBC: 134 ug/dL (ref 120–384)

## 2019-01-14 LAB — FERRITIN: Ferritin: 374 ng/mL — ABNORMAL HIGH (ref 11–307)

## 2019-01-16 ENCOUNTER — Other Ambulatory Visit: Payer: HMO

## 2019-01-16 ENCOUNTER — Ambulatory Visit: Payer: HMO | Admitting: Family

## 2019-01-17 DIAGNOSIS — I4891 Unspecified atrial fibrillation: Secondary | ICD-10-CM | POA: Diagnosis not present

## 2019-01-17 DIAGNOSIS — J449 Chronic obstructive pulmonary disease, unspecified: Secondary | ICD-10-CM | POA: Diagnosis not present

## 2019-01-19 ENCOUNTER — Other Ambulatory Visit: Payer: Self-pay | Admitting: Family Medicine

## 2019-01-19 DIAGNOSIS — R52 Pain, unspecified: Secondary | ICD-10-CM

## 2019-01-19 NOTE — Telephone Encounter (Signed)
Requested medication (s) are due for refill today: yes  Requested medication (s) are on the active medication list: yes  Last refill:  12/22/2018  Future visit scheduled: yes  Notes to clinic:  Refill cannot be delegated   Requested Prescriptions  Pending Prescriptions Disp Refills   oxyCODONE-acetaminophen (PERCOCET) 10-325 MG tablet 120 tablet 0    Sig: Take 1 tablet by mouth every 4 (four) hours as needed. PAIN     Not Delegated - Analgesics:  Opioid Agonist Combinations Failed - 01/19/2019  9:36 AM      Failed - This refill cannot be delegated      Failed - Urine Drug Screen completed in last 360 days.      Passed - Valid encounter within last 6 months    Recent Outpatient Visits          1 month ago Closed wound of head, subsequent encounter   [facility] at [place], [place]   2 months ago Need for influenza vaccination   [facility] at [place], DO   5 months ago Hyperlipidemia LDL goal <100   [facility] at [place], DO   1 year ago Bad odor of urine   [facility] [place], DO   1 year ago Anemia, chronic disease   [facility] at [facility], [place], DO      Future Appointments            In 1 month [place], Jeneen Rinks, MD [place], [place]   In 3 months [place], DO [employer] [facility] at [employer] [facility], [place]

## 2019-01-19 NOTE — Telephone Encounter (Signed)
Medication Refill - Medication: oxyCODONE-acetaminophen (PERCOCET) 10-325 MG tablet   Preferred Pharmacy:  [facility], [place] [address]  Phone: [phone] Fax: [phone]     Pt was advised that RX refills may take up to 3 business days. We ask that you follow-up with your pharmacy.

## 2019-01-19 NOTE — Telephone Encounter (Signed)
Last written: 12/22/18 Last ov: 11/06/18 Next ov: 05/07/19 Contract: 11/06/18 UDS: 11/06/18

## 2019-01-20 MED ORDER — OXYCODONE-ACETAMINOPHEN 10-325 MG PO TABS: 1.0000 | ORAL_TABLET | ORAL | 0 refills | Status: DC | PRN

## 2019-01-23 DIAGNOSIS — J449 Chronic obstructive pulmonary disease, unspecified: Secondary | ICD-10-CM | POA: Diagnosis not present

## 2019-01-23 DIAGNOSIS — I4891 Unspecified atrial fibrillation: Secondary | ICD-10-CM | POA: Diagnosis not present

## 2019-01-26 ENCOUNTER — Encounter: Payer: HMO | Admitting: *Deleted

## 2019-01-28 ENCOUNTER — Telehealth: Payer: Self-pay

## 2019-01-28 NOTE — Telephone Encounter (Signed)
Left message for patient to remind of missed remote transmission. LL

## 2019-02-03 ENCOUNTER — Other Ambulatory Visit: Payer: Self-pay | Admitting: Cardiology

## 2019-02-03 ENCOUNTER — Other Ambulatory Visit: Payer: Self-pay | Admitting: Family Medicine

## 2019-02-03 DIAGNOSIS — E785 Hyperlipidemia, unspecified: Secondary | ICD-10-CM

## 2019-02-04 ENCOUNTER — Other Ambulatory Visit: Payer: Self-pay | Admitting: Pharmacist

## 2019-02-04 NOTE — Patient Outreach (Signed)
[place] [facility]) Care Management  02/04/2019  [name] [birth year] [id number]   Patient was called for CSNP follow up. HIPAA identifiers were obtained.  Patient is a 73 year old female with multiple medical conditions including but not limited to: A Fib, CAD, CHF, COPD, DJD, Hypertension,  Fibromyalgia, GERD, hyperlipidemia, osteoarthritis, RLS, and vitamin D deficiency.  Patient did not report any medications issues. She shared that she and her husband would be driving to [place] to be with family for the Thanksgiving holiday.  Patient was reminded to wear her mask, wash hands and socially distance herself from others.  Patient's medications were reviewed via telephone: Medications Reviewed Today    Reviewed by Elayne Guerin, [facility] (Pharmacist) on 02/04/19 at 1142  Med List Status: <None>  Medication Order Taking? Sig Documenting Provider Last Dose Status Informant  acetaminophen (TYLENOL) 325 MG tablet [id number] Yes Take 2 tablets (650 mg total) by mouth every 4 (four) hours as needed. Saverio Danker, PA-C Taking Active   albuterol (VENTOLIN HFA) 108 (90 Base) MCG/ACT inhaler [id number] Yes Inhale 2 puffs into the lungs every 6 (six) hours as needed for wheezing or shortness of breath. Lauraine Rinne, NP Taking Active   amLODipine (NORVASC) 2.5 MG tablet [id number] Yes Take 1 tablet (2.5 mg total) by mouth daily. Minus Breeding, MD Taking Active   apixaban (ELIQUIS) 5 MG TABS tablet [id number] Yes Take 1 tablet (5 mg total) by mouth 2 (two) times daily. Carollee Herter, Kendrick Fries R, DO Taking Active   atorvastatin (LIPITOR) 20 MG tablet [id number] Yes TAKE 1 TABLET BY MOUTH ONCE DAILY AT  [address], Alferd Apa, DO Taking Active   carvedilol (COREG) 25 MG tablet [id number] Yes Take 1 tablet by mouth twice daily Minus Breeding, MD Taking Active   denosumab (PROLIA) 60 MG/ML SOSY injection [id number] Yes Inject 60 mg into the skin every 6 (six) months. [provider]  Taking Active   furosemide (LASIX) 40 MG tablet [id number] Yes TAKE 1 TABLET BY MOUTH IN THE [name], MD Taking Active   gabapentin (NEURONTIN) 600 MG tablet [id number] Yes Take 1 tablet by mouth 4 times daily Ann Held, DO Taking Active         Discontinued 06/05/11 1433 (Error)   lidocaine-prilocaine (EMLA) cream [id number] Yes Apply 1 application topically as needed. Volanda Napoleon, MD Taking Active   nitrofurantoin (MACRODANTIN) 100 MG capsule [id number] Yes Take 100 mg by mouth daily. [provider] Taking Active   nitroGLYCERIN (NITROSTAT) 0.4 MG SL tablet [id number] Yes Place 1 tablet (0.4 mg total) under the tongue every 5 (five) minutes as needed. CHEST PAIN Deboraha Sprang, MD Taking Active Self           Med Note (CANTER, [facility] D   Tue Dec 16, 2018 11:11 AM) PRN  oxyCODONE-acetaminophen (PERCOCET) 10-325 MG tablet [id number] Yes Take 1 tablet by mouth every 4 (four) hours as needed. PAIN Ann Held, DO Taking Active   pantoprazole (PROTONIX) 40 MG tablet [id number] Yes Take 1 tablet by mouth twice daily Gatha Mayer, MD Taking Active   promethazine (PHENERGAN) 25 MG tablet [id number] Yes TAKE ONE TABLET BY MOUTH EVERY 8 HOURS AS NEEDED FOR NAUSEA Gatha Mayer, MD Taking Active   tiZANidine (ZANAFLEX) 4 MG tablet [id number] Yes Take 1 tablet by mouth three times daily as needed for muscle spasm Carollee Herter, Alferd Apa, DO Taking Active   triamcinolone ointment (KENALOG) 0.1 %  [id number] Yes APPLY TO SKIN TWICE DAILY FOR 14 DAYS [provider] Taking Active   umeclidinium bromide (INCRUSE ELLIPTA) 62.5 MCG/INH AEPB [id number] Yes Inhale 1 puff into the lungs daily. Lauraine Rinne, NP Taking Active   zolpidem (AMBIEN) 5 MG tablet [id number] Yes TAKE 1 TABLET BY MOUTH EVERY DAY AT BEDTIME AS NEEDED Ann Held, DO Taking Active          Patient is receiving the following medications through pharmacy assistance programming and  reported having enough to last her through the end of the year:  Eliquis,  Proventil HFA and Incruse Ellipta.  Plan: Follow up with the patient in 3-4 months.  Elayne Guerin, PharmD, [place] Clinical Pharmacist [phone]

## 2019-02-09 ENCOUNTER — Other Ambulatory Visit: Payer: Self-pay

## 2019-02-13 ENCOUNTER — Other Ambulatory Visit: Payer: Self-pay | Admitting: Family Medicine

## 2019-02-13 DIAGNOSIS — G4700 Insomnia, unspecified: Secondary | ICD-10-CM

## 2019-02-16 ENCOUNTER — Inpatient Hospital Stay (HOSPITAL_BASED_OUTPATIENT_CLINIC_OR_DEPARTMENT_OTHER): Payer: HMO | Admitting: Family

## 2019-02-16 ENCOUNTER — Inpatient Hospital Stay: Payer: HMO | Attending: Family

## 2019-02-16 ENCOUNTER — Other Ambulatory Visit: Payer: Self-pay

## 2019-02-16 ENCOUNTER — Other Ambulatory Visit: Payer: Self-pay | Admitting: Family

## 2019-02-16 ENCOUNTER — Inpatient Hospital Stay: Payer: HMO

## 2019-02-16 VITALS — BP 141/60 | HR 70 | Temp 97.5°F | Resp 17

## 2019-02-16 DIAGNOSIS — Z79899 Other long term (current) drug therapy: Secondary | ICD-10-CM | POA: Insufficient documentation

## 2019-02-16 DIAGNOSIS — D508 Other iron deficiency anemias: Secondary | ICD-10-CM | POA: Diagnosis not present

## 2019-02-16 DIAGNOSIS — D500 Iron deficiency anemia secondary to blood loss (chronic): Secondary | ICD-10-CM

## 2019-02-16 DIAGNOSIS — N189 Chronic kidney disease, unspecified: Secondary | ICD-10-CM | POA: Insufficient documentation

## 2019-02-16 DIAGNOSIS — D631 Anemia in chronic kidney disease: Secondary | ICD-10-CM | POA: Diagnosis not present

## 2019-02-16 DIAGNOSIS — I4891 Unspecified atrial fibrillation: Secondary | ICD-10-CM | POA: Diagnosis not present

## 2019-02-16 DIAGNOSIS — J449 Chronic obstructive pulmonary disease, unspecified: Secondary | ICD-10-CM | POA: Diagnosis not present

## 2019-02-16 LAB — CBC WITH DIFFERENTIAL (CANCER CENTER ONLY)
Abs Immature Granulocytes: 0.06 10*3/uL (ref 0.00–0.07)
Basophils Absolute: 0 10*3/uL (ref 0.0–0.1)
Basophils Relative: 0 %
Eosinophils Absolute: 0.2 10*3/uL (ref 0.0–0.5)
Eosinophils Relative: 3 %
HCT: 31.8 % — ABNORMAL LOW (ref 36.0–46.0)
Hemoglobin: 10 g/dL — ABNORMAL LOW (ref 12.0–15.0)
Immature Granulocytes: 1 %
Lymphocytes Relative: 8 %
Lymphs Abs: 0.5 10*3/uL — ABNORMAL LOW (ref 0.7–4.0)
MCH: 29.2 pg (ref 26.0–34.0)
MCHC: 31.4 g/dL (ref 30.0–36.0)
MCV: 92.7 fL (ref 80.0–100.0)
Monocytes Absolute: 0.6 10*3/uL (ref 0.1–1.0)
Monocytes Relative: 8 %
Neutro Abs: 5.5 10*3/uL (ref 1.7–7.7)
Neutrophils Relative %: 80 %
Platelet Count: 237 10*3/uL (ref 150–400)
RBC: 3.43 MIL/uL — ABNORMAL LOW (ref 3.87–5.11)
RDW: 15 % (ref 11.5–15.5)
WBC Count: 6.8 10*3/uL (ref 4.0–10.5)
nRBC: 0 % (ref 0.0–0.2)

## 2019-02-16 LAB — CMP (CANCER CENTER ONLY)
ALT: 13 U/L (ref 0–44)
AST: 20 U/L (ref 15–41)
Albumin: 4 g/dL (ref 3.5–5.0)
Alkaline Phosphatase: 114 U/L (ref 38–126)
Anion gap: 6 (ref 5–15)
BUN: 27 mg/dL — ABNORMAL HIGH (ref 8–23)
CO2: 31 mmol/L (ref 22–32)
Calcium: 8.6 mg/dL — ABNORMAL LOW (ref 8.9–10.3)
Chloride: 101 mmol/L (ref 98–111)
Creatinine: 0.78 mg/dL (ref 0.44–1.00)
GFR, Est AFR Am: >60 mL/min (ref >60)
GFR, Estimated: >60 mL/min (ref >60)
Glucose, Bld: 109 mg/dL — ABNORMAL HIGH (ref 70–99)
Potassium: 4.7 mmol/L (ref 3.5–5.1)
Sodium: 138 mmol/L (ref 135–145)
Total Bilirubin: 0.4 mg/dL (ref 0.3–1.2)
Total Protein: 6.9 g/dL (ref 6.5–8.1)

## 2019-02-16 LAB — RETICULOCYTES
Immature Retic Fract: 6.7 % (ref 2.3–15.9)
RBC.: 3.43 MIL/uL — ABNORMAL LOW (ref 3.87–5.11)
Retic Count, Absolute: 51.8 10*3/uL (ref 19.0–186.0)
Retic Ct Pct: 1.5 % (ref 0.4–3.1)

## 2019-02-16 MED ORDER — ALTEPLASE 2 MG IJ SOLR
2.0000 mg | Freq: Once | INTRAMUSCULAR | Status: DC | PRN
  Filled 2019-02-16: qty 2

## 2019-02-16 MED ORDER — HEPARIN SOD (PORK) LOCK FLUSH 100 UNIT/ML IV SOLN
500.0000 [IU] | Freq: Once | INTRAVENOUS | Status: AC | PRN
  Administered 2019-02-16: 500 [IU]
  Filled 2019-02-16: qty 5

## 2019-02-16 MED ORDER — DARBEPOETIN ALFA 300 MCG/0.6ML IJ SOSY: 300.0000 ug | PREFILLED_SYRINGE | Freq: Once | INTRAMUSCULAR | Status: DC

## 2019-02-16 MED ORDER — EPOETIN ALFA-EPBX 40000 UNIT/ML IJ SOLN
40000.0000 [IU] | Freq: Once | INTRAMUSCULAR | Status: AC
  Administered 2019-02-16: 40000 [IU] via SUBCUTANEOUS

## 2019-02-16 MED ORDER — SODIUM CHLORIDE 0.9% FLUSH
10.0000 mL | INTRAVENOUS | Status: DC | PRN
  Administered 2019-02-16: 10 mL
  Filled 2019-02-16: qty 10

## 2019-02-16 MED ORDER — HEPARIN SOD (PORK) LOCK FLUSH 100 UNIT/ML IV SOLN
250.0000 [IU] | Freq: Once | INTRAVENOUS | Status: DC | PRN
  Filled 2019-02-16: qty 5

## 2019-02-16 MED ORDER — EPOETIN ALFA-EPBX 40000 UNIT/ML IJ SOLN
INTRAMUSCULAR | Status: AC
  Filled 2019-02-16: qty 1

## 2019-02-16 MED ORDER — SODIUM CHLORIDE 0.9% FLUSH
3.0000 mL | Freq: Once | INTRAVENOUS | Status: DC | PRN
  Filled 2019-02-16: qty 10

## 2019-02-16 NOTE — Patient Instructions (Signed)

## 2019-02-16 NOTE — Progress Notes (Signed)
Hematology and Oncology Follow Up Visit  [name] [id number] 1945/06/23 73 y.o. 02/16/2019   Principle Diagnosis:  Iron deficiency anemia Erythropoietin deficient anemia  Current Therapy:   IV iron as indicated Aranesp 300 mcg sq q 3 wks for hgb < 11   Interim History:  [name] is here today for follow-up. She is symptomatic with fatigue. Hgb is 10.0, MCV 92.7.  She states that her SOB will sometimes worsen with over exertion and she will take a break to rest when needed. She has supplemental O2 24 hours  Day.  She denies noting any blood loss. No bruising or petechiae.  No fever, chills, n/v, cough, rash, dizziness, SOB, chest pain, palpitations, abdominal pain or changes in bowel or bladder habits.  No swelling or tenderness in her extremities.  The mild numbness and tingling in her hands is stable and unchanged. She has maintained a good appetite and is staying well hydrated. Her weight is stable.   ECOG Performance Status: 1 - Symptomatic but completely ambulatory  Medications:  Allergies as of 02/16/2019      Reactions   Dabigatran Etexilate Mesylate Other (See Comments)   INTERNAL BLEEDING-pradaxa   Augmentin [amoxicillin-pot Clavulanate] Diarrhea, Nausea Only   Talwin [pentazocine] Other (See Comments)   hallucinations   Lactate Rash      Medication List       Accurate as of February 16, 2019  2:53 PM. If you have any questions, ask your nurse or doctor.        acetaminophen 325 MG tablet Commonly known as: TYLENOL Take 2 tablets (650 mg total) by mouth every 4 (four) hours as needed.   albuterol 108 (90 Base) MCG/ACT inhaler Commonly known as: VENTOLIN HFA Inhale 2 puffs into the lungs every 6 (six) hours as needed for wheezing or shortness of breath.   amLODipine 2.5 MG tablet Commonly known as: NORVASC Take 1 tablet (2.5 mg total) by mouth daily.   apixaban 5 MG Tabs tablet Commonly known as: Eliquis Take 1 tablet (5 mg total) by mouth 2  (two) times daily.   atorvastatin 20 MG tablet Commonly known as: LIPITOR TAKE 1 TABLET BY MOUTH ONCE DAILY AT  6PM   carvedilol 25 MG tablet Commonly known as: COREG Take 1 tablet by mouth twice daily   denosumab 60 MG/ML Sosy injection Commonly known as: PROLIA Inject 60 mg into the skin every 6 (six) months.   furosemide 40 MG tablet Commonly known as: LASIX TAKE 1 TABLET BY MOUTH IN THE MORNING   gabapentin 600 MG tablet Commonly known as: NEURONTIN Take 1 tablet by mouth 4 times daily   Incruse Ellipta 62.5 MCG/INH Aepb Generic drug: umeclidinium bromide Inhale 1 puff into the lungs daily.   lidocaine-prilocaine cream Commonly known as: EMLA Apply 1 application topically as needed.   nitrofurantoin 100 MG capsule Commonly known as: MACRODANTIN Take 100 mg by mouth daily.   nitroGLYCERIN 0.4 MG SL tablet Commonly known as: NITROSTAT Place 1 tablet (0.4 mg total) under the tongue every 5 (five) minutes as needed. CHEST PAIN   oxyCODONE-acetaminophen 10-325 MG tablet Commonly known as: PERCOCET Take 1 tablet by mouth every 4 (four) hours as needed. PAIN   pantoprazole 40 MG tablet Commonly known as: PROTONIX Take 1 tablet by mouth twice daily   promethazine 25 MG tablet Commonly known as: PHENERGAN TAKE ONE TABLET BY MOUTH EVERY 8 HOURS AS NEEDED FOR NAUSEA   tiZANidine 4 MG tablet Commonly known as: ZANAFLEX  Take 1 tablet by mouth three times daily as needed for muscle spasm   triamcinolone ointment 0.1 % Commonly known as: KENALOG APPLY TO SKIN TWICE DAILY FOR 14 DAYS   zolpidem 5 MG tablet Commonly known as: AMBIEN TAKE 1 TABLET BY MOUTH EVERY DAY AT BEDTIME AS NEEDED       Allergies:  Allergies  Allergen Reactions  . Dabigatran Etexilate Mesylate Other (See Comments)    INTERNAL BLEEDING-pradaxa  . Augmentin [Amoxicillin-Pot Clavulanate] Diarrhea and Nausea Only  . Talwin [Pentazocine] Other (See Comments)    hallucinations  . Lactate  Rash    Past Medical History, Surgical history, Social history, and Family History were reviewed and updated.  Review of Systems: All other 10 point review of systems is negative.   Physical Exam:  vitals were not taken for this visit.   Wt Readings from Last 3 Encounters:  12/16/18 94 lb (42.6 kg)  12/09/18 89 lb 3.2 oz (40.5 kg)  11/17/18 87 lb 1.3 oz (39.5 kg)    Ocular: Sclerae unicteric, pupils equal, round and reactive to light Ear-nose-throat: Oropharynx clear, dentition fair Lymphatic: No cervical or supraclavicular adenopathy Lungs no rales or rhonchi, good excursion bilaterally Heart regular rate and rhythm, no murmur appreciated Abd soft, nontender, positive bowel sounds, no liver or spleen tip palpated on exam, no fluid wave  MSK no focal spinal tenderness, no joint edema Neuro: non-focal, well-oriented, appropriate affect Breasts: Deferred   Lab Results  Component Value Date   WBC 6.8 02/16/2019   HGB 10.0 (L) 02/16/2019   HCT 31.8 (L) 02/16/2019   MCV 92.7 02/16/2019   PLT 237 02/16/2019   Lab Results  Component Value Date   FERRITIN 374 (H) 01/13/2019   IRON 73 01/13/2019   TIBC 206 (L) 01/13/2019   UIBC 134 01/13/2019   IRONPCTSAT 35 01/13/2019   Lab Results  Component Value Date   RETICCTPCT 1.5 02/16/2019   RBC 3.43 (L) 02/16/2019   RETICCTABS 64.6 04/01/2009   No results found for: KPAFRELGTCHN, LAMBDASER, KAPLAMBRATIO No results found for: IGGSERUM, IGA, IGMSERUM No results found for: Kathrynn Ducking, MSPIKE, SPEI   Chemistry      Component Value Date/Time   NA 138 02/16/2019 1150   K 4.7 02/16/2019 1150   CL 101 02/16/2019 1150   CO2 31 02/16/2019 1150   BUN 27 (H) 02/16/2019 1150   CREATININE 0.78 02/16/2019 1150   CREATININE 0.91 03/16/2016 1539      Component Value Date/Time   CALCIUM 8.6 (L) 02/16/2019 1150   ALKPHOS 114 02/16/2019 1150   AST 20 02/16/2019 1150   ALT 13 02/16/2019  1150   BILITOT 0.4 02/16/2019 1150       Impression and Plan: [name] is a very pleasant 73yo caucasian female with iron deficiency anemia secondary to malabsorption and possible GI blood loss with Eliquis. She received Retacrit today for Hgb 10.0.  We will see what her iron studies show and bring her back in for infusion if needed.  We will plan to see in another 3 weeks.  She will contact our office with any questions or concerns. We can certainly see her sooner if needed.    Laverna Peace, NP 11/30/20202:53 PM

## 2019-02-16 NOTE — Patient Instructions (Signed)

## 2019-02-16 NOTE — Progress Notes (Unsigned)
Hematology and Oncology Follow Ross Visit  [name] [id number] [birth year] 73 y.o. 02/16/2019   Principle Diagnosis:  Iron deficiency anemia Erythropoietin deficient anemia  Current Therapy:   IV iron as indicated Retacrit 40,000 units SQ for hgb < 11   Interim History:  [name] is ***   ECOG Performance Status: 1 - Symptomatic but completely ambulatory  Medications:  Allergies as of 02/16/2019      Reactions   Dabigatran Etexilate Mesylate Other (See Comments)   INTERNAL BLEEDING-pradaxa   Augmentin [amoxicillin-pot Clavulanate] Diarrhea, Nausea Only   Talwin [pentazocine] Other (See Comments)   hallucinations   Lactate Rash      Medication List       Accurate as of February 16, 2019 12:02 PM. If you have any questions, ask your nurse or doctor.        acetaminophen 325 MG tablet Commonly known as: TYLENOL Take 2 tablets (650 mg total) by mouth every 4 (four) hours as needed.   albuterol 108 (90 Base) MCG/ACT inhaler Commonly known as: VENTOLIN HFA Inhale 2 puffs into the lungs every 6 (six) hours as needed for wheezing or shortness of breath.   amLODipine 2.5 MG tablet Commonly known as: NORVASC Take 1 tablet (2.5 mg total) by mouth daily.   apixaban 5 MG Tabs tablet Commonly known as: Eliquis Take 1 tablet (5 mg total) by mouth 2 (two) times daily.   atorvastatin 20 MG tablet Commonly known as: LIPITOR TAKE 1 TABLET BY MOUTH ONCE DAILY AT  6PM   carvedilol 25 MG tablet Commonly known as: COREG Take 1 tablet by mouth twice daily   denosumab 60 MG/ML Sosy injection Commonly known as: PROLIA Inject 60 mg into the skin every 6 (six) months.   furosemide 40 MG tablet Commonly known as: LASIX TAKE 1 TABLET BY MOUTH IN THE MORNING   gabapentin 600 MG tablet Commonly known as: NEURONTIN Take 1 tablet by mouth 4 times daily   Incruse Ellipta 62.5 MCG/INH Aepb Generic drug: umeclidinium bromide Inhale 1 puff into the lungs daily.    lidocaine-prilocaine cream Commonly known as: EMLA Apply 1 application topically as needed.   nitrofurantoin 100 MG capsule Commonly known as: MACRODANTIN Take 100 mg by mouth daily.   nitroGLYCERIN 0.4 MG SL tablet Commonly known as: NITROSTAT Place 1 tablet (0.4 mg total) under the tongue every 5 (five) minutes as needed. CHEST PAIN   oxyCODONE-acetaminophen 10-325 MG tablet Commonly known as: PERCOCET Take 1 tablet by mouth every 4 (four) hours as needed. PAIN   pantoprazole 40 MG tablet Commonly known as: PROTONIX Take 1 tablet by mouth twice daily   promethazine 25 MG tablet Commonly known as: PHENERGAN TAKE ONE TABLET BY MOUTH EVERY 8 HOURS AS NEEDED FOR NAUSEA   tiZANidine 4 MG tablet Commonly known as: ZANAFLEX Take 1 tablet by mouth three times daily as needed for muscle spasm   triamcinolone ointment 0.1 % Commonly known as: KENALOG APPLY TO SKIN TWICE DAILY FOR 14 DAYS   zolpidem 5 MG tablet Commonly known as: AMBIEN TAKE 1 TABLET BY MOUTH EVERY DAY AT BEDTIME AS NEEDED       Allergies:  Allergies  Allergen Reactions  . Dabigatran Etexilate Mesylate Other (See Comments)    INTERNAL BLEEDING-pradaxa  . Augmentin [Amoxicillin-Pot Clavulanate] Diarrhea and Nausea Only  . Talwin [Pentazocine] Other (See Comments)    hallucinations  . Lactate Rash    Past Medical History, Surgical history, Social history, and Family History were  reviewed and updated.  Review of Systems: All other 10 point review of systems is negative.   Physical Exam:  vitals were not taken for this visit.   Wt Readings from Last 3 Encounters:  12/16/18 94 lb (42.6 kg)  12/09/18 89 lb 3.2 oz (40.5 kg)  11/17/18 87 lb 1.3 oz (39.5 kg)    Ocular: Sclerae unicteric, pupils equal, round and reactive to light Ear-nose-throat: Oropharynx clear, dentition fair Lymphatic: No cervical or supraclavicular adenopathy Lungs no rales or rhonchi, good excursion bilaterally Heart regular  rate and rhythm, no murmur appreciated Abd soft, nontender, positive bowel sounds MSK no focal spinal tenderness, no joint edema Neuro: non-focal, well-oriented, appropriate affect Breasts:   Lab Results  Component Value Date   WBC 6.4 01/13/2019   HGB 11.2 (L) 01/13/2019   HCT 35.9 (L) 01/13/2019   MCV 91.3 01/13/2019   PLT 237 01/13/2019   Lab Results  Component Value Date   FERRITIN 374 (H) 01/13/2019   IRON 73 01/13/2019   TIBC 206 (L) 01/13/2019   UIBC 134 01/13/2019   IRONPCTSAT 35 01/13/2019   Lab Results  Component Value Date   RETICCTPCT 0.7 01/13/2019   RBC 3.88 01/13/2019   RETICCTABS 64.6 04/01/2009   No results found for: KPAFRELGTCHN, LAMBDASER, KAPLAMBRATIO No results found for: IGGSERUM, IGA, IGMSERUM No results found for: Kathrynn Ducking, MSPIKE, SPEI   Chemistry      Component Value Date/Time   NA 138 01/13/2019 1335   K 4.4 01/13/2019 1335   CL 100 01/13/2019 1335   CO2 32 01/13/2019 1335   BUN 18 01/13/2019 1335   CREATININE 0.85 01/13/2019 1335   CREATININE 0.91 03/16/2016 1539      Component Value Date/Time   CALCIUM 8.6 (L) 01/13/2019 1335   ALKPHOS 102 01/13/2019 1335   AST 20 01/13/2019 1335   ALT 14 01/13/2019 1335   BILITOT 0.3 01/13/2019 1335       Impression and Plan: [name] is ***  Becky Peace, NP 11/30/202012:02 PM

## 2019-02-16 NOTE — Telephone Encounter (Signed)
Refill request: zolpidem 5mg  Last refill: 01/12/2019, #30, 0 refill Last ov: 11/06/18 Next ov: 05/07/19 Contract: 11/06/18 UDS: will get at 05/07/19 visit  Please advise.

## 2019-02-17 ENCOUNTER — Telehealth: Payer: Self-pay | Admitting: Family Medicine

## 2019-02-17 ENCOUNTER — Other Ambulatory Visit: Payer: Self-pay | Admitting: Family

## 2019-02-17 ENCOUNTER — Other Ambulatory Visit: Payer: Self-pay | Admitting: Family Medicine

## 2019-02-17 DIAGNOSIS — R52 Pain, unspecified: Secondary | ICD-10-CM

## 2019-02-17 LAB — IRON AND TIBC
Iron: 65 ug/dL (ref 41–142)
Saturation Ratios: 31 % (ref 21–57)
TIBC: 214 ug/dL — ABNORMAL LOW (ref 236–444)
UIBC: 148 ug/dL (ref 120–384)

## 2019-02-17 LAB — FERRITIN: Ferritin: 917 ng/mL — ABNORMAL HIGH (ref 11–307)

## 2019-02-17 MED ORDER — OXYCODONE-ACETAMINOPHEN 10-325 MG PO TABS: 1.0000 | ORAL_TABLET | ORAL | 0 refills | Status: DC | PRN

## 2019-02-17 NOTE — Telephone Encounter (Signed)
Patient notified that rx has been sent in. 

## 2019-02-17 NOTE — Telephone Encounter (Signed)
Filled  datatbase reviewed

## 2019-02-17 NOTE — Telephone Encounter (Signed)
rx refill oxyCODONE-acetaminophen (PERCOCET) 10-325 MG tablet  [place] [address] [phone] (Phone) [phone] (Fax

## 2019-02-17 NOTE — Telephone Encounter (Signed)
Requesting: Percocet Contract: 11/06/2018 UDS: 11/06/2018 Last OV: 12/16/2018 w/ Paz Next OV: 05/07/2019 Last Refill: 01/20/2019, #120--0 RF Database:   Please advise

## 2019-02-23 ENCOUNTER — Encounter: Payer: Self-pay | Admitting: Cardiology

## 2019-02-23 ENCOUNTER — Other Ambulatory Visit: Payer: Self-pay

## 2019-02-23 ENCOUNTER — Ambulatory Visit (INDEPENDENT_AMBULATORY_CARE_PROVIDER_SITE_OTHER): Payer: HMO | Admitting: Cardiology

## 2019-02-23 VITALS — BP 102/58 | HR 67 | Ht 60.0 in | Wt 89.8 lb

## 2019-02-23 DIAGNOSIS — I10 Essential (primary) hypertension: Secondary | ICD-10-CM | POA: Diagnosis not present

## 2019-02-23 NOTE — Progress Notes (Signed)
.    HPI She presents for followup of a nonischemic cardiomyopathy and atrial fibrillation. She was in the hospital twice once in March with a TIA.  She had previous atrial fibrillation and we had avoided anticoagulation because she had no recurrence and she had risk for anticoagulants but she was noted to be apparently atrial fibrillation at that time and so the decision was  made for her to start anticoagulant.  She came back in the hospital in April of this year with acute cholecystitis and eventually had to have cholecystectomy.  She had problems with labile blood pressure.  Echocardiogram done in March demonstrated a well-preserved ejection fraction.  She called recently with swelling in her legs.  I sent her for an ultrasound and she had evidence of a Baker's cyst.     She does have some intermittent swelling behind her left knee.  However, this is not particularly bothersome.  She denies any new cardiovascular symptoms.  He has no chest pressure, neck or arm discomfort.  She has had no weight gain or edema.  She thinks her breathing is at baseline.  She has not had cough fevers or chills.  Her leg swelling is actually lower than it was previously.   Allergies  Allergen Reactions  . Dabigatran Etexilate Mesylate Other (See Comments)    INTERNAL BLEEDING-pradaxa  . Augmentin [Amoxicillin-Pot Clavulanate] Diarrhea and Nausea Only  . Talwin [Pentazocine] Other (See Comments)    hallucinations  . Lactate Rash    Current Outpatient Medications  Medication Sig Dispense Refill  . acetaminophen (TYLENOL) 325 MG tablet Take 2 tablets (650 mg total) by mouth every 4 (four) hours as needed.    [name] albuterol (VENTOLIN HFA) 108 (90 Base) MCG/ACT inhaler Inhale 2 puffs into the lungs every 6 (six) hours as needed for wheezing or shortness of breath. 6.7 g 12  . amLODipine (NORVASC) 2.5 MG tablet Take 1 tablet (2.5 mg total) by mouth daily. 90 tablet 3  . apixaban (ELIQUIS) 5 MG TABS tablet Take 1  tablet (5 mg total) by mouth 2 (two) times daily. 180 tablet 0  . atorvastatin (LIPITOR) 20 MG tablet TAKE 1 TABLET BY MOUTH ONCE DAILY AT  6PM 90 tablet 0  . carvedilol (COREG) 25 MG tablet Take 1 tablet by mouth twice daily 180 tablet 0  . denosumab (PROLIA) 60 MG/ML SOSY injection Inject 60 mg into the skin every 6 (six) months.    . furosemide (LASIX) 40 MG tablet TAKE 1 TABLET BY MOUTH IN THE MORNING 90 tablet 0  . gabapentin (NEURONTIN) 600 MG tablet Take 1 tablet by mouth 4 times daily 120 tablet 1  . lidocaine-prilocaine (EMLA) cream Apply 1 application topically as needed. 30 g 6  . nitrofurantoin (MACRODANTIN) 100 MG capsule Take 100 mg by mouth daily.    . nitroGLYCERIN (NITROSTAT) 0.4 MG SL tablet Place 1 tablet (0.4 mg total) under the tongue every 5 (five) minutes as needed. CHEST PAIN 25 tablet 5  . oxyCODONE-acetaminophen (PERCOCET) 10-325 MG tablet Take 1 tablet by mouth every 4 (four) hours as needed. PAIN 120 tablet 0  . pantoprazole (PROTONIX) 40 MG tablet Take 1 tablet by mouth twice daily 180 tablet 0  . promethazine (PHENERGAN) 25 MG tablet TAKE ONE TABLET BY MOUTH EVERY 8 HOURS AS NEEDED FOR NAUSEA 40 tablet 2  . tiZANidine (ZANAFLEX) 4 MG tablet Take 1 tablet by mouth three times daily as needed for muscle spasm 90 tablet 1  . triamcinolone  ointment (KENALOG) 0.1 % APPLY TO SKIN TWICE DAILY FOR 14 DAYS    . umeclidinium bromide (INCRUSE ELLIPTA) 62.5 MCG/INH AEPB Inhale 1 puff into the lungs daily. 1 each 12  . zolpidem (AMBIEN) 5 MG tablet TAKE 1 TABLET BY MOUTH EVERY DAY AT BEDTIME AS NEEDED 30 tablet 0   No current facility-administered medications for this visit.     Past Medical History:  Diagnosis Date  . Anemia   . Arthritis    right hip  . Atrial fibrillation ([place])   . CAD (coronary artery disease)    mild disease per cath in 2011  . CHF (congestive heart failure) ([place])   . COPD (chronic obstructive pulmonary disease) (H[facility])    continuous O2- 2l  .  Diverticulitis   . Elbow fracture, left aug 2012  . Erythropoietin deficiency anemia 06/19/2018  . Fibromyalgia    COUPLE OF TIMES A YEAR-PT HAS EPISODES OF CONFUSION-USUALLY INVOLVES DAY/NIGHT REVERSAL AND EPISODES OF TWITCHING AND FALLS--SEES DR. Jacelyn Grip - NEUROLOGIST-LAST SEEN 07/10/11  . Gastroparesis   . GERD (gastroesophageal reflux disease)   . H/O: GI bleed    from Pradaxa  . Hepatitis    hx of in high school   . HTN (hypertension)    under control; has been on med. x "years"  . Hx MRSA infection   . Hx of gastric ulcer   . Hx of stress fx aug 2011   right hip   . Iron malabsorption 08/14/2017  . LBBB (left bundle branch block)   . Oxygen dependent    2 liters via nasal cannula at all times  . Pacemaker    CRT therapy; followed by Dr. Caryl Comes  . Pleural effusion     s/p right thoracentesis 03/09  . Pneumonia - 03/09, 12/09, 02/10, 06/10   MOST RECENT FEB 2013  . Primary dilated cardiomyopathy (H[facility])    EF 45 to 50% per echo in Jan 2012  . RLS (restless legs syndrome)   . Silent aspiration   . Small bowel obstruction ([place])   . Urinary incontinence     -INTERSTIM IMPLANT NOT FUNCTIONING PER PT  . Venous embolism and thrombosis of subclavian vein ([place])    after pacemaker insertion Oct 2011  . Vitamin B12 deficiency     Past Surgical History:  Procedure Laterality Date  . APPENDECTOMY    . CARDIAC CATHETERIZATION  04/08/2006, 11/16/2009  . CHOLECYSTECTOMY N/A 06/27/2017   Procedure: OPEN CHOLECYSTECTOMY;  Surgeon: Ralene Ok, MD;  Location: [facility];  Service: General;  Laterality: N/A;  . COLECTOMY     Intestinal resection (5times)  . COLECTOMY  02/04/2000   ex. lap., intra-abd. subtotal colectomy with ileosigmoid colon anastomosies and lysis of adhesions  . CYSTOSCOPY  11/11/2008  . CYSTOSCOPY W/ RETROGRADES  11/11/2008   right  . CYSTOSCOPY WITH INJECTION  04/30/2006   transurethral collagen injection; incision vaginal stenosis  . ERCP N/A 06/23/2017   Procedure:  ENDOSCOPIC RETROGRADE CHOLANGIOPANCREATOGRAPHY (ERCP);  Surgeon: Carol Ada, MD;  Location: [facility];  Service: Endoscopy;  Laterality: N/A;  . ERCP N/A 06/24/2017   Procedure: ENDOSCOPIC RETROGRADE CHOLANGIOPANCREATOGRAPHY (ERCP);  Surgeon: [name], MD;  Location[facility];  Service: Endoscopy;  Laterality: N/A;  . ESOPHAGOGASTRODUODENOSCOPY N/A 02/02/2013   Procedure: ESOPHAGOGASTRODUODENOSCOPY (EGD);  Surgeon: Lafayette Dragon, MD;  Location: [facility];  Service: Endoscopy;  Laterality: N/A;  . EXPLORATORY LAPAROTOMY  04/27/2009   lysis of adhesions, gastrostomy tube  . GASTROCUTANEOUS FISTULA CLOSURE    .  INTERSTIM IMPLANT PLACEMENT  05/28/2006 - stage I   06/05/2006 - stage II  . INTERSTIM IMPLANT PLACEMENT  02/05/2012   Procedure: [name] IMPLANT FIRST STAGE;  Surgeon: Reece Packer, MD;  Location: [facility];  Service: Urology;  Laterality: N/A;  Replacement of Interstim Lead     . INTERSTIM IMPLANT REVISION  03/06/2011   Procedure: REVISION OF [name];  Surgeon: Reece Packer, MD;  Location: [facility];  Service: Urology;  Laterality: N/A;  Replacement of Neurostimulator  . INTERSTIM IMPLANT REVISION  10/23/2007  . IR IMAGING GUIDED PORT INSERTION  10/14/2017  . PACEMAKER PLACEMENT  12/21/2009  . Peg removed     with complications  . PORT-A-CATH REMOVAL  07/27/2011   Procedure: REMOVAL PORT-A-CATH;  Surgeon: Rolm Bookbinder, MD;  Location: [facility];  Service: General;  Laterality: N/A;  . PORTACATH PLACEMENT  12/16/2009  . SAVORY DILATION N/A 02/02/2013   Procedure: SAVORY DILATION;  Surgeon: Lafayette Dragon, MD;  Location: [facility];  Service: Endoscopy;  Laterality: N/A;  . SMALL INTESTINE SURGERY  05/20/2001   ex. lap., resection of small bowel stricture; gastrostomy; insertion central line  . TONSILLECTOMY  age 42  . TOTAL ABDOMINAL HYSTERECTOMY     complete  . TOTAL HIP ARTHROPLASTY  08/03/2011   Procedure: TOTAL HIP ARTHROPLASTY ANTERIOR  APPROACH;  Surgeon: Mcarthur Rossetti, MD;  Location: [facility];  Service: Orthopedics;  Laterality: Right;    ROS:   As stated in the HPI and negative for all other systems.  PHYSICAL EXAM BP (!) 102/58   Pulse 67   Ht 5' (1.524 m)   Wt 89 lb 12.8 oz (40.7 kg)   BMI 17.54 kg/m   GENERAL: Frail appearing but in no distress  NECK:  No jugular venous distention, waveform within normal limits, carotid upstroke brisk and symmetric, no bruits, no thyromegaly LUNGS:  Clear to auscultation bilaterally CHEST:  Unremarkable HEART:  PMI not displaced or sustained,S1 and S2 within normal limits, no S3, no S4, no clicks, no rubs, no murmurs ABD:  Flat, positive bowel sounds normal in frequency in pitch, no bruits, no rebound, no guarding, no midline pulsatile mass, no hepatomegaly, no splenomegaly EXT:  2 plus pulses throughout, no edema, no cyanosis no clubbing   EKG: Sinus rhythm, rate 67 , ventricular pacing 100% capture  ASSESSMENT AND PLAN   ATRIAL FIBRILLATION -  [name] has a CHA2DS2 - VASc score of 4 with a risk of stroke of 4%.  She tolerates anticoagulation and reviewing her most recent device interrogation I do not see any frequent paroxysms.   CARDIOMYOPATHY, PRIMARY, DILATED -  Her EF is now reported to be 55%.   She has had no new symptoms.  She feels at baseline.  No further work-up is suggested.  As long as she gets her iron and her hemoglobin stabilized around 10 or so her breathing is okay.  HYPERTENSION -  Her blood pressure is controlled.  No change in therapy.  LEG SWELLING -   Her leg swelling is much improved.  No change in therapy.  She does have a Baker's cyst once this conservatively managed for now.  PACEMAKER - I sent a message to the pacemaker clinic as she is approaching ERI.  I let her know about this as well that she follows up with her pacemaker interrogation in 4 days.  In October she had 4 months of battery life.  COVID EDUCATION - We  talked  about the vaccine.

## 2019-02-23 NOTE — Patient Instructions (Signed)
Medication Instructions:  Your physician recommends that you continue on your current medications as directed. Please refer to the Current Medication list given to you today.  *If you need a refill on your cardiac medications before your next appointment, please call your pharmacy*  Lab Work: none If you have labs (blood work) drawn today and your tests are completely normal, you will receive your results only by: [name] MyChart Message (if you have MyChart) OR . A paper copy in the mail If you have any lab test that is abnormal or we need to change your treatment, we will call you to review the results.  Testing/Procedures: none  Follow-Up: At [facility], you and your health needs are our priority.  As part of our continuing mission to provide you with exceptional heart care, we have created designated Provider Care Teams.  These Care Teams include your primary Cardiologist (physician) and Advanced Practice Providers (APPs -  Physician Assistants and Nurse Practitioners) who all work together to provide you with the care you need, when you need it.  Your next appointment:   12 month(s)  The format for your next appointment:   In Person  Provider:   You may see Minus Breeding, MD or one of the following Advanced Practice Providers on your designated Care Team:    Rosaria Ferries, PA-C  Jory Sims, DNP, ANP  Cadence Kathlen Mody, NP

## 2019-02-26 ENCOUNTER — Other Ambulatory Visit: Payer: Self-pay | Admitting: Family Medicine

## 2019-02-26 ENCOUNTER — Telehealth: Payer: Self-pay

## 2019-02-26 DIAGNOSIS — R52 Pain, unspecified: Secondary | ICD-10-CM

## 2019-02-26 NOTE — Telephone Encounter (Signed)
-----   Message from Mechele Dawley, RN sent at 02/24/2019  6:31 PM EST ----- Salome Spotted,  Please check in with Ms. Mordan regarding her upcoming monthly battery check on 12/11. We did not receive her 11/9 transmission.  Thanks! Raquel Sarna ----- Message ----- From: Minus Breeding, MD Sent: 02/23/2019  11:44 AM EST To: Mechele Dawley, RN  Just want you all to keep your eye on her.  Nearing ERI.

## 2019-02-26 NOTE — Telephone Encounter (Signed)
I spoke with the pt and she agreed to send a manual transmission while it is fresh on her mind.

## 2019-02-27 ENCOUNTER — Ambulatory Visit (INDEPENDENT_AMBULATORY_CARE_PROVIDER_SITE_OTHER): Payer: HMO | Admitting: *Deleted

## 2019-02-27 DIAGNOSIS — Z950 Presence of cardiac pacemaker: Secondary | ICD-10-CM

## 2019-02-27 LAB — CUP PACEART REMOTE DEVICE CHECK
Battery Remaining Longevity: 4 mo
Battery Voltage: 2.79 V
Brady Statistic AP VP Percent: 0.86 %
Brady Statistic AP VS Percent: 0.01 %
Brady Statistic AS VP Percent: 99.09 %
Brady Statistic AS VS Percent: 0.05 %
Brady Statistic RA Percent Paced: 0.85 %
Brady Statistic RV Percent Paced: 99.93 %
Date Time Interrogation Session: 20201210201725
Implantable Lead Implant Date: 20111005
Implantable Lead Implant Date: 20111005
Implantable Lead Implant Date: 20111005
Implantable Lead Location: 753858
Implantable Lead Location: 753859
Implantable Lead Location: 753860
Implantable Lead Model: 4196
Implantable Lead Model: 5076
Implantable Lead Model: 5076
Implantable Pulse Generator Implant Date: 20111005
Lead Channel Impedance Value: 304 Ohm
Lead Channel Impedance Value: 361 Ohm
Lead Channel Impedance Value: 418 Ohm
Lead Channel Impedance Value: 437 Ohm
Lead Channel Impedance Value: 475 Ohm
Lead Channel Impedance Value: 494 Ohm
Lead Channel Impedance Value: 494 Ohm
Lead Channel Impedance Value: 551 Ohm
Lead Channel Impedance Value: 741 Ohm
Lead Channel Pacing Threshold Amplitude: 0.5 V
Lead Channel Pacing Threshold Amplitude: 0.5 V
Lead Channel Pacing Threshold Pulse Width: 0.4 ms
Lead Channel Pacing Threshold Pulse Width: 0.4 ms
Lead Channel Sensing Intrinsic Amplitude: 1.5 mV
Lead Channel Sensing Intrinsic Amplitude: 1.5 mV
Lead Channel Sensing Intrinsic Amplitude: 4.875 mV
Lead Channel Sensing Intrinsic Amplitude: 4.875 mV
Lead Channel Setting Pacing Amplitude: 1.25 V
Lead Channel Setting Pacing Amplitude: 2 V
Lead Channel Setting Pacing Amplitude: 2.5 V
Lead Channel Setting Pacing Pulse Width: 0.4 ms
Lead Channel Setting Pacing Pulse Width: 0.8 ms
Lead Channel Setting Sensing Sensitivity: 0.9 mV

## 2019-02-27 NOTE — Telephone Encounter (Signed)
Transmission received.

## 2019-02-27 NOTE — Telephone Encounter (Addendum)
Transmission received. Estimated longevity 3 months. Will start monthly checks. Left message for patient.   Chanetta Marshall, NP 02/27/2019 8:51 AM

## 2019-03-09 ENCOUNTER — Other Ambulatory Visit: Payer: Self-pay

## 2019-03-09 ENCOUNTER — Encounter: Payer: Self-pay | Admitting: Family

## 2019-03-09 ENCOUNTER — Inpatient Hospital Stay (HOSPITAL_BASED_OUTPATIENT_CLINIC_OR_DEPARTMENT_OTHER): Payer: HMO | Admitting: Family

## 2019-03-09 ENCOUNTER — Inpatient Hospital Stay: Payer: HMO

## 2019-03-09 ENCOUNTER — Telehealth: Payer: Self-pay | Admitting: Family

## 2019-03-09 ENCOUNTER — Inpatient Hospital Stay: Payer: HMO | Attending: Family

## 2019-03-09 VITALS — BP 96/74 | HR 62 | Temp 97.8°F | Resp 18 | Ht 60.0 in | Wt 91.1 lb

## 2019-03-09 DIAGNOSIS — K909 Intestinal malabsorption, unspecified: Secondary | ICD-10-CM | POA: Insufficient documentation

## 2019-03-09 DIAGNOSIS — D508 Other iron deficiency anemias: Secondary | ICD-10-CM | POA: Insufficient documentation

## 2019-03-09 DIAGNOSIS — D500 Iron deficiency anemia secondary to blood loss (chronic): Secondary | ICD-10-CM

## 2019-03-09 DIAGNOSIS — D631 Anemia in chronic kidney disease: Secondary | ICD-10-CM

## 2019-03-09 LAB — CMP (CANCER CENTER ONLY)
ALT: 9 U/L (ref 0–44)
AST: 17 U/L (ref 15–41)
Albumin: 3.8 g/dL (ref 3.5–5.0)
Alkaline Phosphatase: 102 U/L (ref 38–126)
Anion gap: 4 — ABNORMAL LOW (ref 5–15)
BUN: 22 mg/dL (ref 8–23)
CO2: 33 mmol/L — ABNORMAL HIGH (ref 22–32)
Calcium: 8.6 mg/dL — ABNORMAL LOW (ref 8.9–10.3)
Chloride: 102 mmol/L (ref 98–111)
Creatinine: 0.9 mg/dL (ref 0.44–1.00)
GFR, Est AFR Am: >60 mL/min (ref >60)
GFR, Estimated: >60 mL/min (ref >60)
Glucose, Bld: 107 mg/dL — ABNORMAL HIGH (ref 70–99)
Potassium: 4.8 mmol/L (ref 3.5–5.1)
Sodium: 139 mmol/L (ref 135–145)
Total Bilirubin: 0.4 mg/dL (ref 0.3–1.2)
Total Protein: 7 g/dL (ref 6.5–8.1)

## 2019-03-09 LAB — CBC WITH DIFFERENTIAL (CANCER CENTER ONLY)
Abs Immature Granulocytes: 0.03 K/uL (ref 0.00–0.07)
Basophils Absolute: 0.1 K/uL (ref 0.0–0.1)
Basophils Relative: 1 %
Eosinophils Absolute: 0.2 K/uL (ref 0.0–0.5)
Eosinophils Relative: 3 %
HCT: 36.8 % (ref 36.0–46.0)
Hemoglobin: 11.3 g/dL — ABNORMAL LOW (ref 12.0–15.0)
Immature Granulocytes: 1 %
Lymphocytes Relative: 10 %
Lymphs Abs: 0.6 K/uL — ABNORMAL LOW (ref 0.7–4.0)
MCH: 28.9 pg (ref 26.0–34.0)
MCHC: 30.7 g/dL (ref 30.0–36.0)
MCV: 94.1 fL (ref 80.0–100.0)
Monocytes Absolute: 0.5 K/uL (ref 0.1–1.0)
Monocytes Relative: 7 %
Neutro Abs: 5.2 K/uL (ref 1.7–7.7)
Neutrophils Relative %: 78 %
Platelet Count: 266 K/uL (ref 150–400)
RBC: 3.91 MIL/uL (ref 3.87–5.11)
RDW: 13.7 % (ref 11.5–15.5)
WBC Count: 6.7 K/uL (ref 4.0–10.5)
nRBC: 0 % (ref 0.0–0.2)

## 2019-03-09 LAB — RETICULOCYTES
Immature Retic Fract: 2.3 % (ref 2.3–15.9)
RBC.: 3.9 MIL/uL (ref 3.87–5.11)
Retic Count, Absolute: 25.3 10*3/uL (ref 19.0–186.0)
Retic Ct Pct: 0.7 % (ref 0.4–3.1)

## 2019-03-09 MED ORDER — EPOETIN ALFA-EPBX 40000 UNIT/ML IJ SOLN
INTRAMUSCULAR | Status: AC
  Filled 2019-03-09: qty 1

## 2019-03-09 MED ORDER — HEPARIN SOD (PORK) LOCK FLUSH 100 UNIT/ML IV SOLN
500.0000 [IU] | Freq: Once | INTRAVENOUS | Status: AC | PRN
  Administered 2019-03-09: 500 [IU]
  Filled 2019-03-09: qty 5

## 2019-03-09 MED ORDER — SODIUM CHLORIDE 0.9% FLUSH
10.0000 mL | Freq: Once | INTRAVENOUS | Status: AC | PRN
  Administered 2019-03-09: 10 mL
  Filled 2019-03-09: qty 10

## 2019-03-09 NOTE — Progress Notes (Signed)
Hematology and Oncology Follow Up Visit  [name] [id number] April 09, 1945 73 y.o. 03/09/2019   Principle Diagnosis:  Iron deficiency anemia Erythropoietin deficient anemia  Current Therapy:   IV iron as indicated Aranesp 300 mcg sq q 3 wks for hgb < 11   Interim History:  [name] had a telephone visit today for follow-up. She is doing well and states that she is feeling a little bit better. [name] energy is improved.  She is doing well on 3 L supplemental O2 24 hours a day and [name] SOB is stable.  No episodes of bleeding. No bruising or petechiae.  Hgb is stable at 11.3.  No fever, chills, n/v, cough, rash, dizziness, chest pain, palpitations, abdominal pain or changes in bowel or bladder habits.  The numbness and tingling in [name] fingertips is unchanged.  No swelling or tenderness in [name] extremities. No falls or syncopal episodes to report.  She is eating well and staying hydrated. [name] weight is stable.   ECOG Performance Status: 1 - Symptomatic but completely ambulatory  Medications:  Allergies as of 03/09/2019      Reactions   Dabigatran Etexilate Mesylate Other (See Comments)   INTERNAL BLEEDING-pradaxa   Augmentin [amoxicillin-pot Clavulanate] Diarrhea, Nausea Only   Talwin [pentazocine] Other (See Comments)   hallucinations   Lactate Rash      Medication List       Accurate as of March 09, 2019 11:22 AM. If you have any questions, ask your nurse or doctor.        acetaminophen 325 MG tablet Commonly known as: TYLENOL Take 2 tablets (650 mg total) by mouth every 4 (four) hours as needed.   albuterol 108 (90 Base) MCG/ACT inhaler Commonly known as: VENTOLIN HFA Inhale 2 puffs into the lungs every 6 (six) hours as needed for wheezing or shortness of breath.   amLODipine 2.5 MG tablet Commonly known as: NORVASC Take 1 tablet (2.5 mg total) by mouth daily.   apixaban 5 MG Tabs tablet Commonly known as: Eliquis Take 1 tablet (5 mg total) by mouth 2  (two) times daily.   atorvastatin 20 MG tablet Commonly known as: LIPITOR TAKE 1 TABLET BY MOUTH ONCE DAILY AT  6PM   carvedilol 25 MG tablet Commonly known as: COREG Take 1 tablet by mouth twice daily   denosumab 60 MG/ML Sosy injection Commonly known as: PROLIA Inject 60 mg into the skin every 6 (six) months.   furosemide 40 MG tablet Commonly known as: LASIX TAKE 1 TABLET BY MOUTH IN THE MORNING   gabapentin 600 MG tablet Commonly known as: NEURONTIN Take 1 tablet by mouth 4 times daily   Incruse Ellipta 62.5 MCG/INH Aepb Generic drug: umeclidinium bromide Inhale 1 puff into the lungs daily.   lidocaine-prilocaine cream Commonly known as: EMLA Apply 1 application topically as needed.   nitrofurantoin 100 MG capsule Commonly known as: MACRODANTIN Take 100 mg by mouth daily.   nitroGLYCERIN 0.4 MG SL tablet Commonly known as: NITROSTAT Place 1 tablet (0.4 mg total) under the tongue every 5 (five) minutes as needed. CHEST PAIN   oxyCODONE-acetaminophen 10-325 MG tablet Commonly known as: PERCOCET Take 1 tablet by mouth every 4 (four) hours as needed. PAIN   pantoprazole 40 MG tablet Commonly known as: PROTONIX Take 1 tablet by mouth twice daily   promethazine 25 MG tablet Commonly known as: PHENERGAN TAKE ONE TABLET BY MOUTH EVERY 8 HOURS AS NEEDED FOR NAUSEA   tiZANidine 4 MG tablet Commonly known  as: ZANAFLEX Take 1 tablet by mouth three times daily as needed for muscle spasm   triamcinolone ointment 0.1 % Commonly known as: KENALOG APPLY TO SKIN TWICE DAILY FOR 14 DAYS   zolpidem 5 MG tablet Commonly known as: AMBIEN TAKE 1 TABLET BY MOUTH EVERY DAY AT BEDTIME AS NEEDED       Allergies:  Allergies  Allergen Reactions  . Dabigatran Etexilate Mesylate Other (See Comments)    INTERNAL BLEEDING-pradaxa  . Augmentin [Amoxicillin-Pot Clavulanate] Diarrhea and Nausea Only  . Talwin [Pentazocine] Other (See Comments)    hallucinations  . Lactate  Rash    Past Medical History, Surgical history, Social history, and Family History were reviewed and updated.  Review of Systems: All other 10 point review of systems is negative.   Physical Exam:  height is 5' (1.524 m) and weight is 91 lb 1.3 oz (41.3 kg). [name] temporal temperature is 97.8 F (36.6 C). [name] blood pressure is 96/74 and [name] pulse is 62. [name] respiration is 18 and oxygen saturation is 100%.   Wt Readings from Last 3 Encounters:  03/09/19 91 lb 1.3 oz (41.3 kg)  02/23/19 89 lb 12.8 oz (40.7 kg)  12/16/18 94 lb (42.6 kg)     Lab Results  Component Value Date   WBC 6.7 03/09/2019   HGB 11.3 (L) 03/09/2019   HCT 36.8 03/09/2019   MCV 94.1 03/09/2019   PLT 266 03/09/2019   Lab Results  Component Value Date   FERRITIN 917 (H) 02/16/2019   IRON 65 02/16/2019   TIBC 214 (L) 02/16/2019   UIBC 148 02/16/2019   IRONPCTSAT 31 02/16/2019   Lab Results  Component Value Date   RETICCTPCT 0.7 03/09/2019   RBC 3.91 03/09/2019   RBC 3.90 03/09/2019   RETICCTABS 64.6 04/01/2009   No results found for: KPAFRELGTCHN, LAMBDASER, KAPLAMBRATIO No results found for: IGGSERUM, IGA, IGMSERUM No results found for: Odetta Pink, SPEI   Chemistry      Component Value Date/Time   NA 139 03/09/2019 1028   K 4.8 03/09/2019 1028   CL 102 03/09/2019 1028   CO2 33 (H) 03/09/2019 1028   BUN 22 03/09/2019 1028   CREATININE 0.90 03/09/2019 1028   CREATININE 0.91 03/16/2016 1539      Component Value Date/Time   CALCIUM 8.6 (L) 03/09/2019 1028   ALKPHOS 102 03/09/2019 1028   AST 17 03/09/2019 1028   ALT 9 03/09/2019 1028   BILITOT 0.4 03/09/2019 1028       Impression and Plan:  [name] is a very pleasant 73yo caucasian female with iron deficiency anemia secondary to malabsorption and possible GI blood loss with Eliquis. Hgb is stable at 11.3, no Aranesp needed at this time.  We will plan to see [name] back in another 4  weeks.  She will contact our office with any questions or concerns. We can certainly see [name] sooner if needed.   Laverna Peace, NP 12/21/202011:22 AM

## 2019-03-09 NOTE — Telephone Encounter (Signed)
Appointments scheduled patient declined calendar due to My Chart per 12/21 los

## 2019-03-10 LAB — FERRITIN: Ferritin: 562 ng/mL — ABNORMAL HIGH (ref 11–307)

## 2019-03-10 LAB — IRON AND TIBC
Iron: 94 ug/dL (ref 41–142)
Saturation Ratios: 45 % (ref 21–57)
TIBC: 210 ug/dL — ABNORMAL LOW (ref 236–444)
UIBC: 116 ug/dL — ABNORMAL LOW (ref 120–384)

## 2019-03-13 DIAGNOSIS — J449 Chronic obstructive pulmonary disease, unspecified: Secondary | ICD-10-CM | POA: Diagnosis not present

## 2019-03-13 DIAGNOSIS — I4891 Unspecified atrial fibrillation: Secondary | ICD-10-CM | POA: Diagnosis not present

## 2019-03-16 ENCOUNTER — Other Ambulatory Visit: Payer: Self-pay | Admitting: Family Medicine

## 2019-03-16 DIAGNOSIS — G4700 Insomnia, unspecified: Secondary | ICD-10-CM

## 2019-03-16 DIAGNOSIS — R52 Pain, unspecified: Secondary | ICD-10-CM

## 2019-03-16 MED ORDER — OXYCODONE-ACETAMINOPHEN 10-325 MG PO TABS: 1.0000 | ORAL_TABLET | ORAL | 0 refills | Status: DC | PRN

## 2019-03-16 NOTE — Telephone Encounter (Signed)
Requested medication (s) are due for refill today: yes  Requested medication (s) are on the active medication list: yes  Last refill:  02/17/2019  Future visit scheduled: no  Notes to clinic:  This refill cannot be delegated    Requested Prescriptions  Pending Prescriptions Disp Refills   oxyCODONE-acetaminophen (PERCOCET) 10-325 MG tablet 120 tablet 0    Sig: Take 1 tablet by mouth every 4 (four) hours as needed. PAIN      Not Delegated - Analgesics:  Opioid Agonist Combinations Failed - 03/16/2019  8:52 AM      Failed - This refill cannot be delegated      Failed - Urine Drug Screen completed in last 360 days.      Passed - Valid encounter within last 6 months    Recent Outpatient Visits           3 months ago Closed wound of head, subsequent encounter   [facility] [place], MD   4 months ago Need for influenza vaccination   [facility] [place], DO   7 months ago Hyperlipidemia LDL goal <100   [facility] [place], DO   1 year ago Bad odor of urine   [facility] [place], DO   1 year ago Anemia, chronic disease   [facility] [place], DO       Future Appointments             In 1 month [place], [place] at [facility], [place]

## 2019-03-16 NOTE — Telephone Encounter (Signed)
Medication Refill - Medication: oxyCODONE-acetaminophen (PERCOCET) 10-325 MG tablet  Preferred Pharmacy:  [facility], [place] [address]  Phone: [phone] Fax: [phone]     Pt was advised that RX refills may take up to 3 business days. We ask that you follow-up with your pharmacy.

## 2019-03-18 ENCOUNTER — Other Ambulatory Visit: Payer: Self-pay | Admitting: Family Medicine

## 2019-03-18 DIAGNOSIS — G4700 Insomnia, unspecified: Secondary | ICD-10-CM

## 2019-03-18 MED ORDER — ZOLPIDEM TARTRATE 5 MG PO TABS: ORAL_TABLET | ORAL | 0 refills | Status: DC

## 2019-03-18 NOTE — Telephone Encounter (Signed)
Copied from [place] [id number]. Topic: Quick Communication - Rx Refill/Question >> Mar 18, 2019  9:18 AM Rainey Pines A wrote: Medication: zolpidem (AMBIEN) 5 MG tablet [Pharmacy Med Name: Zolpidem Tartrate 5 MG (Patient stated that she has one pill left and would like medication request expedited.)  Has the patient contacted their pharmacy? {Yes (Agent: If no, request that the patient contact the pharmacy for the refill.) (Agent: If yes, when and what did the pharmacy advise?)Contact PCP  Preferred Pharmacy (with phone number or street name): [facility], [place]  Phone:  [phone] Fax:  [phone]     Agent: Please be advised that RX refills may take up to 3 business days. We ask that you follow-up with your pharmacy.

## 2019-03-18 NOTE — Telephone Encounter (Signed)
Requesting:zolpidem Contract:11/14/2018 AXK:[id number] Last Visit:12/16/2018 Next Visit:none Last Refill:02/16/2019  Please Advise

## 2019-03-18 NOTE — Telephone Encounter (Signed)
Requested medication (s) are due for refill today: yes  Requested medication (s) are on the active medication list: yes  Last refill:  02/16/2019  Future visit scheduled: yes  Notes to clinic:  This refill cannot be delegated    Requested Prescriptions  Pending Prescriptions Disp Refills   zolpidem (AMBIEN) 5 MG tablet 30 tablet 0      Not Delegated - Psychiatry:  Anxiolytics/Hypnotics Failed - 03/18/2019  9:28 AM      Failed - This refill cannot be delegated      Failed - Urine Drug Screen completed in last 360 days.      Passed - Valid encounter within last 6 months    Recent Outpatient Visits           3 months ago Closed wound of head, subsequent encounter   [facility] [place], MD   4 months ago Need for influenza vaccination   [facility] [place], DO   7 months ago Hyperlipidemia LDL goal <100   [facility] [place], DO   1 year ago Bad odor of urine   [facility] [place], DO   1 year ago Anemia, chronic disease   [facility] at [place], DO       Future Appointments             In 1 month [place], [place] at [facility], [place]

## 2019-03-19 DIAGNOSIS — J449 Chronic obstructive pulmonary disease, unspecified: Secondary | ICD-10-CM | POA: Diagnosis not present

## 2019-03-19 DIAGNOSIS — I4891 Unspecified atrial fibrillation: Secondary | ICD-10-CM | POA: Diagnosis not present

## 2019-03-24 ENCOUNTER — Telehealth (INDEPENDENT_AMBULATORY_CARE_PROVIDER_SITE_OTHER): Payer: HMO | Admitting: Pulmonary Disease

## 2019-03-24 ENCOUNTER — Encounter: Payer: Self-pay | Admitting: Pulmonary Disease

## 2019-03-24 ENCOUNTER — Telehealth: Payer: Self-pay | Admitting: Pulmonary Disease

## 2019-03-24 DIAGNOSIS — J471 Bronchiectasis with (acute) exacerbation: Secondary | ICD-10-CM

## 2019-03-24 DIAGNOSIS — J439 Emphysema, unspecified: Secondary | ICD-10-CM | POA: Diagnosis not present

## 2019-03-24 DIAGNOSIS — R05 Cough: Secondary | ICD-10-CM

## 2019-03-24 DIAGNOSIS — J9611 Chronic respiratory failure with hypoxia: Secondary | ICD-10-CM | POA: Diagnosis not present

## 2019-03-24 DIAGNOSIS — R059 Cough, unspecified: Secondary | ICD-10-CM

## 2019-03-24 MED ORDER — DOXYCYCLINE HYCLATE 100 MG PO TABS: 100.0000 mg | ORAL_TABLET | Freq: Two times a day (BID) | ORAL | 0 refills | Status: DC

## 2019-03-24 NOTE — Addendum Note (Signed)
Addended by: Valerie Salts on: 03/24/2019 12:08 PM   Modules accepted: Orders

## 2019-03-24 NOTE — Patient Instructions (Addendum)
You were seen today by Lauraine Rinne, NP  for:   1. Pulmonary emphysema, unspecified emphysema type (HCC)  Incruse Ellipta  >>> Take 1 puff daily in the morning right when you wake up >>>Rinse your mouth out after use >>>This is a daily maintenance inhaler, NOT a rescue inhaler >>>Contact our office if you are having difficulties affording or obtaining this medication >>>It is important for you to be able to take this daily and not miss any doses   Note your daily symptoms > remember "red flags" for COPD:   >>>Increase in cough >>>increase in sputum production >>>increase in shortness of breath or activity  intolerance.   If you notice these symptoms, please call the office to be seen.    2. Chronic hypoxemic respiratory failure (HCC)  Continue oxygen therapy as prescribed  >>>maintain oxygen saturations greater than 88 percent  >>>if unable to maintain oxygen saturations please contact the office  >>>do not smoke with oxygen  >>>can use nasal saline gel or nasal saline rinses to moisturize nose if oxygen causes dryness   3. Bronchiectasis with acute exacerbation (HCC)  - doxycycline (VIBRA-TABS) 100 MG tablet; Take 1 tablet (100 mg total) by mouth 2 (two) times daily.  Dispense: 20 tablet; Refill: 0 - CT Chest High Resolution; Future  Bronchiectasis: This is the medical term which indicates that you have damage, dilated airways making you more susceptible to respiratory infection. Use a flutter valve 10 breaths twice a day or 4 to 5 breaths 4-5 times a day to help clear mucus out Let us know if you have cough with change in mucus color or fevers or chills.  At that point you would need an antibiotic. Maintain a healthy nutritious diet, eating whole foods Take your medications as prescribed   We will attempt to obtain sputum cultures Follow the instructions provided with the sputum cups   We recommend today:  Orders Placed This Encounter  Procedures  . CT Chest High  Resolution    -High-res CT with supine and prone positioning -inspiratory and expiratory cuts.  -Only to be read by Dr. Rosario Jacks and Dr. Weber Cooks.    Standing Status:   Future    Standing Expiration Date:   05/21/2020    Scheduling Instructions:     Complete in February/2021    Order Specific Question:   Preferred imaging location?    Answer:   [facility]    Order Specific Question:   Radiology Contrast Protocol - do NOT remove file path    Answer:   [email or web address]   Orders Placed This Encounter  Procedures  . CT Chest High Resolution   Meds ordered this encounter  Medications  . doxycycline (VIBRA-TABS) 100 MG tablet    Sig: Take 1 tablet (100 mg total) by mouth 2 (two) times daily.    Dispense:  20 tablet    Refill:  0    Follow Up:    No follow-ups on file.   Please do your part to reduce the spread of COVID-19:      Reduce your risk of any infection  and COVID19 by using the similar precautions used for avoiding the common cold or flu:  [name] Wash your hands often with soap and warm water for at least 20 seconds.  If soap and water are not readily available, use an alcohol-based hand sanitizer with at least 60% alcohol.  . If coughing or sneezing, cover your mouth and nose by coughing or  sneezing into the elbow areas of your shirt or coat, into a tissue or into your sleeve (not your hands). Langley Gauss A MASK when in public  . Avoid shaking hands with others and consider head nods or verbal greetings only. . Avoid touching your eyes, nose, or mouth with unwashed hands.  . Avoid close contact with people who are sick. . Avoid places or events with large numbers of people in one location, like concerts or sporting events. . If you have some symptoms but not all symptoms, continue to monitor at home and seek medical attention if your symptoms worsen. . If you are having a medical emergency, call 911.   [place] / e-Visit: [email or web address]         [facility]: [facility]: [phone]                   [facility]: [phone]     It is flu season:   >>> Best ways to protect herself from the flu: Receive the yearly flu vaccine, practice good hand hygiene washing with soap and also using hand sanitizer when available, eat a nutritious meals, get adequate rest, hydrate appropriately   Please contact the office if your symptoms worsen or you have concerns that you are not improving.   Thank you for choosing [facility] for your healthcare, and for allowing [place] to partner with you on your healthcare journey. I am thankful to be able to provide care to you today.   Wyn Quaker FNP-C

## 2019-03-24 NOTE — Assessment & Plan Note (Signed)
Plan: Sputum cultures today-respiratory sputum, AFB Doxycycline today Continue flutter valve Start taking regular Mucinex not Mucinex DM May need to consider hypertonic saline nebs in the future High-resolution CT chest ordered to be completed in February/2021

## 2019-03-24 NOTE — Assessment & Plan Note (Signed)
Plan: Continue increase Continue rescue inhaler Continue oxygen therapy

## 2019-03-24 NOTE — Progress Notes (Signed)
Reviewed and agree with assessment/plan.   Ellesse Antenucci, MD [facility] 03/14/2016, 12:24 PM Pager:  [phone]  

## 2019-03-24 NOTE — Telephone Encounter (Signed)
Patient states she has a productive cough, not sure of color. She states she has 4L oxygen sats are 94-96%. She requests to do video visit with Aaron Edelman today.  Patient scheduled at 1130 Nothing further needed

## 2019-03-24 NOTE — Assessment & Plan Note (Signed)
Plan: Continue oxygen therapy 

## 2019-03-24 NOTE — Addendum Note (Signed)
Addended by: Valerie Salts on: 03/24/2019 12:04 PM   Modules accepted: Orders

## 2019-03-24 NOTE — Progress Notes (Signed)
Virtual Visit via Video Note  I connected with [name] on 03/24/19 at 11:30 AM EST by a video enabled telemedicine application and verified that I am speaking with the correct person using two identifiers.  Location: Patient: Home Provider: Office - [facility] - [address], [address], [place], [place] [address]  I discussed the limitations of evaluation and management by telemedicine and the availability of in person appointments. The patient expressed understanding and agreed to proceed. I also discussed with the patient that there may be a patient responsible charge related to this service. The patient expressed understanding and agreed to proceed.  Patient consented to consult via telephone: Yes People present and their role in pt care: Pt   History of Present Illness:  74 year old female former smoker followed in our office for COPD, chronic hypoxemic respiratory failure, bronchiectasis  Past medical history: Hypocalcemia, left bundle branch block, A. fib flutter, hypertension, chronic pain syndrome, osteoarthritis, chronic fatigue, GERD, insomnia, small bowel obstruction, plaque psoriasis (previously used methotrexate had recurrent respiratory infections) Smoking history: Former smoker.  Quit 1988.  20-pack-year smoking history. Maintenance: Incruse  Patient of Dr. Halford Chessman  Chief complaint: Cough, congestion worsened over the last 33 days  74 year old female former smoker followed in our office for COPD, chronic hypoxemic respiratory failure and bronchiectasis.  Patient contacted our office today reporting she has had worsening cough and congestion over the last 7 days.  She reports that she continues to be afebrile.  Patient reports she is had a return of the right lower lobe lung pain.  She was last treated via televisit in August/2020 with Augmentin.  She reports that she did get clinically improved with that course of Augmentin but she did have severe diarrhea with that  antibiotic.  She was last seen in office in July/2020 with Dr. Halford Chessman.  Patient reports she continues to be adherent with her flutter valve.  She continues take Mucinex DM.  Patient does not use hypertonic saline.  She denies any recent sick contacts.  She is practicing physical distancing, social isolation, proper handwashing, wearing a mask.  She reports that she stays at home.  She has not noticed any sort of increase in her oxygen needs.  She is still maintained on 4 L continuous which is her baseline.   Last time patient sputum was cultured was in January/2019.  Negative respiratory sputum as well as negative AFB.  Last CT was in January/2019 which showed significant interval progression spectrum of findings consistent with MAI.  Recommended CT follow-up.  This has not been completed.  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, diffuse cylindrical bronchiectasis, patchy tree-in-bud opacities and patchy nodule clear peribronchial vascular and peripheral foci of consolidation, follow-up high resolution posttreatment chest CT may be considered 6 to 12 months, stable chronic mediastinal and right hilar adenopathy most compatible with reactive adenopathy  06/01/2017-echocardiogram-LV ejection fraction 55 to 56%, grade 1 diastolic dysfunction, PA pressure 33  10/10/2011-pulmonary function test-FVC 1.47 (54% predicted), postbronchodilator ratio 64, postbronchodilator FEV1 1.15 (59% predicted), positive bronchodilator response, DLCO 5.3 (35% predicted)  Social History   Tobacco Use  Smoking Status Former Smoker  . Packs/day: 1.00  .  Years: 20.00  . Pack years: 20.00  . Quit date: 03/19/1986  . Years  since quitting: 33.0  Smokeless Tobacco Never Used   Immunization History  Administered Date(s) Administered  . DTaP 11/03/1996, 01/04/1997, 05/03/1997, 10/19/1997, 08/05/2001  . Fluad Quad(high Dose 65+) 11/06/2018  . HPV Quadrivalent 09/05/2009, 09/13/2010, 09/26/2011  . Hepatitis A, Ped/Adol-2 Dose 09/21/2004, 03/29/2005  . Hepatitis B, ped/adol 04/30/1996, 11/03/1996, 10/19/1997  . HiB (PRP-T) 11/03/1996, 01/04/1997, 05/03/1997, 10/19/1997  . IPV 11/03/1996, 01/04/1997, 05/03/1997, 08/05/2001  . Influenza Split 01/16/2011, 12/12/2011  . Influenza Whole 01/15/2007, 12/08/2007, 12/28/2008, 12/17/2009  . Influenza, High Dose Seasonal PF 11/28/2015, 12/07/2016, 11/15/2017  . Influenza, Seasonal, Injecte, Preservative Fre 12/09/2012  . Influenza,Quad,Nasal, Live 03/17/2007, 03/06/2009, 01/08/2010, 01/30/2011, 02/09/2012, 01/18/2013  . Influenza,inj,Quad PF,6+ Mos 03/29/2005, 04/30/2005, 03/01/2006, 01/28/2008, 12/01/2013, 01/16/2014, 12/22/2014, 12/28/2014  . MMR 05/03/1997, 08/05/2001  . Meningococcal B, OMV 12/22/2014  . Pneumococcal Conjugate-13 06/10/2014, 11/15/2014  . Pneumococcal Polysaccharide-23 12/17/2009  . Tdap 09/17/2007, 05/15/2014, 12/09/2018  . Varicella 11/16/1997, 05/18/2008  . Zoster 12/31/2013    Assessment and Plan:  Bronchiectasis ([place]) Plan: Sputum cultures today-respiratory sputum, AFB Doxycycline today Continue flutter valve Start taking regular Mucinex not Mucinex DM May need to consider hypertonic saline nebs in the future High-resolution CT chest ordered to be completed in February/2021  Chronic hypoxemic respiratory failure Plan: Continue oxygen therapy  COPD with emphysema ([place]) Plan: Continue increase Continue rescue inhaler Continue oxygen therapy   Follow Up Instructions:  Return in about 2 months (around 05/22/2019), or if symptoms worsen or fail to improve, for  Follow up with Wyn Quaker FNP-C, Follow up with Dr. Halford Chessman, After Chest CT.    I discussed the assessment and treatment plan with the patient. The patient was provided an opportunity to ask questions and all were answered. The patient agreed with the plan and demonstrated an understanding of the instructions.   The patient was advised to call back or seek an in-person evaluation if the symptoms worsen or if the condition fails to improve as anticipated.  I provided 26 minutes of non-face-to-face time during this encounter.   Lauraine Rinne, NP

## 2019-03-25 ENCOUNTER — Other Ambulatory Visit: Payer: Self-pay | Admitting: Family Medicine

## 2019-03-25 DIAGNOSIS — R52 Pain, unspecified: Secondary | ICD-10-CM

## 2019-03-26 NOTE — Telephone Encounter (Signed)
Last OV 11/06/18 Last refill 12/25/18 #90/1 Next OV 05/07/19

## 2019-03-27 ENCOUNTER — Other Ambulatory Visit: Payer: Self-pay | Admitting: Family Medicine

## 2019-03-27 DIAGNOSIS — R52 Pain, unspecified: Secondary | ICD-10-CM

## 2019-03-30 ENCOUNTER — Ambulatory Visit (INDEPENDENT_AMBULATORY_CARE_PROVIDER_SITE_OTHER): Payer: HMO | Admitting: *Deleted

## 2019-03-30 DIAGNOSIS — Z950 Presence of cardiac pacemaker: Secondary | ICD-10-CM

## 2019-03-30 LAB — CUP PACEART REMOTE DEVICE CHECK
Battery Remaining Longevity: 3 mo
Battery Voltage: 2.78 V
Brady Statistic AP VP Percent: 2.06 %
Brady Statistic AP VS Percent: 0.01 %
Brady Statistic AS VP Percent: 97.88 %
Brady Statistic AS VS Percent: 0.06 %
Brady Statistic RA Percent Paced: 2.03 %
Brady Statistic RV Percent Paced: 99.92 %
Date Time Interrogation Session: 20210111221107
Implantable Lead Implant Date: 20111005
Implantable Lead Implant Date: 20111005
Implantable Lead Implant Date: 20111005
Implantable Lead Location: 753858
Implantable Lead Location: 753859
Implantable Lead Location: 753860
Implantable Lead Model: 4196
Implantable Lead Model: 5076
Implantable Lead Model: 5076
Implantable Pulse Generator Implant Date: 20111005
Lead Channel Impedance Value: 323 Ohm
Lead Channel Impedance Value: 380 Ohm
Lead Channel Impedance Value: 399 Ohm
Lead Channel Impedance Value: 437 Ohm
Lead Channel Impedance Value: 456 Ohm
Lead Channel Impedance Value: 475 Ohm
Lead Channel Impedance Value: 475 Ohm
Lead Channel Impedance Value: 532 Ohm
Lead Channel Impedance Value: 684 Ohm
Lead Channel Pacing Threshold Amplitude: 0.5 V
Lead Channel Pacing Threshold Amplitude: 0.625 V
Lead Channel Pacing Threshold Pulse Width: 0.4 ms
Lead Channel Pacing Threshold Pulse Width: 0.4 ms
Lead Channel Sensing Intrinsic Amplitude: 1 mV
Lead Channel Sensing Intrinsic Amplitude: 1 mV
Lead Channel Sensing Intrinsic Amplitude: 4.875 mV
Lead Channel Sensing Intrinsic Amplitude: 4.875 mV
Lead Channel Setting Pacing Amplitude: 1.25 V
Lead Channel Setting Pacing Amplitude: 2 V
Lead Channel Setting Pacing Amplitude: 2.5 V
Lead Channel Setting Pacing Pulse Width: 0.4 ms
Lead Channel Setting Pacing Pulse Width: 0.8 ms
Lead Channel Setting Sensing Sensitivity: 0.9 mV

## 2019-03-31 ENCOUNTER — Other Ambulatory Visit: Payer: Self-pay | Admitting: Cardiology

## 2019-04-06 ENCOUNTER — Telehealth (INDEPENDENT_AMBULATORY_CARE_PROVIDER_SITE_OTHER): Payer: HMO | Admitting: Pulmonary Disease

## 2019-04-06 ENCOUNTER — Other Ambulatory Visit: Payer: Self-pay

## 2019-04-06 ENCOUNTER — Telehealth: Payer: Self-pay | Admitting: Pulmonary Disease

## 2019-04-06 ENCOUNTER — Encounter: Payer: Self-pay | Admitting: Pulmonary Disease

## 2019-04-06 DIAGNOSIS — J439 Emphysema, unspecified: Secondary | ICD-10-CM | POA: Diagnosis not present

## 2019-04-06 DIAGNOSIS — J471 Bronchiectasis with (acute) exacerbation: Secondary | ICD-10-CM | POA: Diagnosis not present

## 2019-04-06 DIAGNOSIS — J9611 Chronic respiratory failure with hypoxia: Secondary | ICD-10-CM | POA: Diagnosis not present

## 2019-04-06 MED ORDER — SODIUM CHLORIDE 3 % IN NEBU: INHALATION_SOLUTION | Freq: Two times a day (BID) | RESPIRATORY_TRACT | 3 refills | Status: DC

## 2019-04-06 MED ORDER — LEVOFLOXACIN 500 MG PO TABS: 500.0000 mg | ORAL_TABLET | Freq: Every day | ORAL | 0 refills | Status: DC

## 2019-04-06 NOTE — Progress Notes (Signed)
Reviewed and agree with assessment/plan.   Jolene Guyett, MD [facility] 03/14/2016, 12:24 PM Pager:  [phone]  

## 2019-04-06 NOTE — Assessment & Plan Note (Signed)
Plan: Continue oxygen therapy 

## 2019-04-06 NOTE — Assessment & Plan Note (Signed)
Plan: Continue Incruse Continue risk inhaler Continue oxygen therapy as prescribed

## 2019-04-06 NOTE — Patient Instructions (Addendum)
You were seen today by Lauraine Rinne, NP  for:   1. Bronchiectasis with acute exacerbation (HCC)  - levofloxacin (LEVAQUIN) 500 MG tablet; Take 1 tablet (500 mg total) by mouth daily.  Dispense: 10 tablet; Refill: 0 - sodium chloride HYPERTONIC 3 % nebulizer solution; Take by nebulization 2 (two) times daily.  Dispense: 750 mL; Refill: 3  Bronchiectasis: This is the medical term which indicates that you have damage, dilated airways making you more susceptible to respiratory infection. Use a flutter valve 10 breaths twice a day or 4 to 5 breaths 4-5 times a day to help clear mucus out Let us know if you have cough with change in mucus color or fevers or chills.  At that point you would need an antibiotic. Maintain a healthy nutritious diet, eating whole foods Take your medications as prescribed   Continue oxygen therapy as prescribed  >>>maintain oxygen saturations greater than 88 percent  >>>if unable to maintain oxygen saturations please contact the office  >>>do not smoke with oxygen  >>>can use nasal saline gel or nasal saline rinses to moisturize nose if oxygen causes dryness  Incruse Ellipta  >>> Take 1 puff daily in the morning right when you wake up >>>Rinse your mouth out after use >>>This is a daily maintenance inhaler, NOT a rescue inhaler >>>Contact our office if you are having difficulties affording or obtaining this medication >>>It is important for you to be able to take this daily and not miss any doses   Complete high-resolution CT chest as ordered   We recommend today:   Meds ordered this encounter  Medications  . levofloxacin (LEVAQUIN) 500 MG tablet    Sig: Take 1 tablet (500 mg total) by mouth daily.    Dispense:  10 tablet    Refill:  0  . sodium chloride HYPERTONIC 3 % nebulizer solution    Sig: Take by nebulization 2 (two) times daily.    Dispense:  750 mL    Refill:  3    Follow Up:    Return in about 10 days (around 04/16/2019), or if symptoms  worsen or fail to improve, for Follow up with Wyn Quaker FNP-C.   Please do your part to reduce the spread of COVID-19:      Reduce your risk of any infection  and COVID19 by using the similar precautions used for avoiding the common cold or flu:  [name] Wash your hands often with soap and warm water for at least 20 seconds.  If soap and water are not readily available, use an alcohol-based hand sanitizer with at least 60% alcohol.  . If coughing or sneezing, cover your mouth and nose by coughing or sneezing into the elbow areas of your shirt or coat, into a tissue or into your sleeve (not your hands). Langley Gauss A MASK when in public  . Avoid shaking hands with others and consider head nods or verbal greetings only. . Avoid touching your eyes, nose, or mouth with unwashed hands.  . Avoid close contact with people who are sick. . Avoid places or events with large numbers of people in one location, like concerts or sporting events. . If you have some symptoms but not all symptoms, continue to monitor at home and seek medical attention if your symptoms worsen. . If you are having a medical emergency, call 911.   ADDITIONAL HEALTHCARE OPTIONS FOR PATIENTS  [facility] / e-Visit: [email or web address]         [facility]: [phone]  [facility]: [phone]                   [facility]: [phone]     It is flu season:   >>> Best ways to protect herself from the flu: Receive the yearly flu vaccine, practice good hand hygiene washing with soap and also using hand sanitizer when available, eat a nutritious meals, get adequate rest, hydrate appropriately   Please contact the office if your symptoms worsen or you have concerns that you are not improving.   Thank you for choosing [facility] for your healthcare, and for allowing [place] to partner with you on your healthcare journey. I am  thankful to be able to provide care to you today.   Wyn Quaker FNP-C

## 2019-04-06 NOTE — Telephone Encounter (Signed)
Spoke with pt. She states that she is not improving since her last video visit with Aaron Edelman. Still having issues with fatigue and coughing. I have scheduled her for another video visit with Aaron Edelman today at 1430. Nothing further was needed.

## 2019-04-06 NOTE — Assessment & Plan Note (Signed)
Plan: Levaquin today Continue flutter valve Continue Mucinex We will start hypertonic saline nebs Complete high-resolution CT chest is ordered We will coordinate 7 to 10-day virtual visit follow-up to ensure symptoms are improving

## 2019-04-06 NOTE — Progress Notes (Signed)
Virtual Visit via Video Note  I connected with [name] on 04/06/19 at  2:30 PM EST by a video enabled telemedicine application and verified that I am speaking with the correct person using two identifiers.  Location: Patient: Home Provider: Office - [facility] - [address], [address], [place], [place] [address]  I discussed the limitations of evaluation and management by telemedicine and the availability of in person appointments. The patient expressed understanding and agreed to proceed. I also discussed with the patient that there may be a patient responsible charge related to this service. The patient expressed understanding and agreed to proceed.  Patient consented to consult via telephone: Yes People present and their role in pt care: Pt   History of Present Illness:  73-year-old female former smoker followed in our office for COPD, chronic hypoxemic respiratory failure, bronchiectasis  Past medical history: Hypocalcemia, left bundle branch block, A. fib flutter, hypertension, chronic pain syndrome, osteoarthritis, chronic fatigue, GERD, insomnia, small bowel obstruction, plaque psoriasis (previously used methotrexate had recurrent respiratory infections) Smoking history: Former smoker.  Quit 1988.  20-pack-year smoking history. Maintenance: Incruse  Patient of Dr. Sood  Chief complaint: Fatigued, Cough    73-year-old female former smoker followed in our office for bronchiectasis.  Patient completing acute video visit as symptoms have not improved after completing course of doxycycline.  Patient has an audible cough.  Patient continues to look visibly fatigued.  She is also been set up for a high-resolution CT chest to further evaluate her bronchiectasis.  This is scheduled for February/2020.  Patient is unable to cough up mucus.  She denies chills, body aches or fevers.  Patient does use her flutter valve.  She is not currently using hypertonic saline  nebs.  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, diffuse cylindrical bronchiectasis, patchy tree-in-bud opacities and patchy nodule clear peribronchial vascular and peripheral foci of consolidation, follow-up high resolution posttreatment chest CT may be considered 6 to 12 months, stable chronic mediastinal and right hilar adenopathy most compatible with reactive adenopathy  06/01/2017-echocardiogram-LV ejection fraction 55 to 60%, grade 1 diastolic dysfunction, PA pressure 33  10/10/2011-pulmonary function test-FVC 1.47 (54% predicted), postbronchodilator ratio 64, postbronchodilator FEV1 1.15 (59% predicted), positive bronchodilator response, DLCO 5.3 (35% predicted)  Social History   Tobacco Use  Smoking Status Former Smoker  . Packs/day: 1.00  . Years: 20.00  . Pack years: 20.00  . Quit date: 03/19/1986  . Years since quitting: 33.0  Smokeless Tobacco Never Used   Immunization History  Administered Date(s) Administered  . DTaP 11/03/1996, 01/04/1997, 05/03/1997, 10/19/1997, 08/05/2001  . Fluad Quad(high Dose 65+) 11/06/2018  . HPV Quadrivalent 09/05/2009, 09/13/2010, 09/26/2011  . Hepatitis A, Ped/Adol-2 Dose 09/21/2004, 03/29/2005  . Hepatitis B, ped/adol 04/30/1996, 11/03/1996, 10/19/1997  . HiB (PRP-T) 11/03/1996, 01/04/1997, 05/03/1997, 10/19/1997  . IPV 11/03/1996, 01/04/1997, 05/03/1997, 08/05/2001  . Influenza Split 01/16/2011, 12/12/2011  . Influenza Whole 01/15/2007, 12/08/2007, 12/28/2008, 12/17/2009  . Influenza, High Dose Seasonal PF 11/28/2015, 12/07/2016, 11/15/2017  . Influenza, Seasonal, Injecte,  Preservative Fre 12/09/2012  . Influenza,Quad,Nasal, Live 03/17/2007, 03/06/2009, 01/08/2010, 01/30/2011, 02/09/2012, 01/18/2013  . Influenza,inj,Quad PF,6+ Mos 03/29/2005, 04/30/2005, 03/01/2006, 01/28/2008, 12/01/2013, 01/16/2014, 12/22/2014, 12/28/2014  . MMR 05/03/1997, 08/05/2001  . Meningococcal B, OMV 12/22/2014  . Pneumococcal Conjugate-13 06/10/2014,   11/15/2014  . Pneumococcal Polysaccharide-23 12/17/2009  . Tdap 09/17/2007, 05/15/2014, 12/09/2018  . Varicella 11/16/1997, 05/18/2008  . Zoster 12/31/2013    Assessment and Plan:  COPD with emphysema ([place]) Plan: Continue Incruse Continue risk inhaler Continue oxygen therapy as prescribed  Chronic hypoxemic respiratory failure Plan: Continue oxygen therapy  Bronchiectasis (HCC) Plan: Levaquin today Continue flutter valve Continue Mucinex We will start hypertonic saline nebs Complete high-resolution CT chest is ordered We will coordinate 7 to 10-day virtual visit follow-up to ensure symptoms are improving   Follow Up Instructions:  Return in about 10 days (around 04/16/2019), or if symptoms worsen or fail to improve, for Follow up with Wyn Quaker FNP-C.    I discussed the assessment and treatment plan with the patient. The patient was provided an opportunity to ask questions and all were answered. The patient agreed with the plan and demonstrated an understanding of the instructions.   The patient was advised to call back or seek an in-person evaluation if the symptoms worsen or if the condition fails to improve as anticipated.  I provided 24 minutes of non-face-to-face time during this encounter.   Lauraine Rinne, NP

## 2019-04-07 ENCOUNTER — Telehealth: Payer: Self-pay | Admitting: Pulmonary Disease

## 2019-04-07 DIAGNOSIS — J471 Bronchiectasis with (acute) exacerbation: Secondary | ICD-10-CM

## 2019-04-07 NOTE — Telephone Encounter (Signed)
Called spoke with patient who reported that she had a video visit with [facility] NP yesterday 04/06/19 and Hypertonic Neb soln was initiated and Rx was sent.  However, there was no order for neb machine and patient does not currently own one.  Apologized to patient - advised will send order now, marked urgent since she's already been waiting 24 hours to begin therapy.  Will send order to [employer] since she is already established with this company.  Asked patient to please call the office back if she does not hear from them in the next 24hours.  Patient voiced her understanding.  Order placed. Nothing further needed; will sign off.   Patient Instructions You were seen today by Becky Rinne, NP  for:   1. Bronchiectasis with acute exacerbation (HCC)  - levofloxacin (LEVAQUIN) 500 MG tablet; Take 1 tablet (500 mg total) by mouth daily.  Dispense: 10 tablet; Refill: 0 - sodium chloride HYPERTONIC 3 % nebulizer solution; Take by nebulization 2 (two) times daily.  Dispense: 750 mL; Refill: 3  Bronchiectasis: This is the medical term which indicates that you have damage, dilated airways making you more susceptible to respiratory infection. Use a flutter valve 10 breaths twice a day or 4 to 5 breaths 4-5 times a day to help clear mucus out Let us know if you have cough with change in mucus color or fevers or chills.  At that point you would need an antibiotic. Maintain a healthy nutritious diet, eating whole foods Take your medications as prescribed   Continue oxygen therapy as prescribed  >>>maintain oxygen saturations greater than 88 percent  >>>if unable to maintain oxygen saturations please contact the office  >>>do not smoke with oxygen  >>>can use nasal saline gel or nasal saline rinses to moisturize nose if oxygen causes dryness  Incruse Ellipta  >>> Take 1 puff daily in the morning right when you wake up >>>Rinse your mouth out after use >>>This is a daily maintenance inhaler, NOT a  rescue inhaler >>>Contact our office if you are having difficulties affording or obtaining this medication >>>It is important for you to be able to take this daily and not miss any doses  Complete high-resolution CT chest as ordered   We recommend today:   Meds ordered this encounter  Medications  . levofloxacin (LEVAQUIN) 500 MG tablet    Sig: Take 1 tablet (500 mg total) by mouth daily.    Dispense:  10 tablet    Refill:  0  . sodium chloride HYPERTONIC 3 % nebulizer solution    Sig: Take by nebulization 2 (two) times daily.    Dispense:  750 mL    Refill:  3    Follow Up:    Return in about 10 days (around 04/16/2019), or if symptoms worsen or fail to improve, for Follow up with Wyn Quaker FNP-C.

## 2019-04-09 ENCOUNTER — Telehealth: Payer: Self-pay | Admitting: Family

## 2019-04-09 ENCOUNTER — Inpatient Hospital Stay: Payer: HMO | Attending: Family

## 2019-04-09 ENCOUNTER — Other Ambulatory Visit: Payer: Self-pay

## 2019-04-09 ENCOUNTER — Inpatient Hospital Stay (HOSPITAL_BASED_OUTPATIENT_CLINIC_OR_DEPARTMENT_OTHER): Payer: HMO | Admitting: Family

## 2019-04-09 ENCOUNTER — Inpatient Hospital Stay: Payer: HMO

## 2019-04-09 ENCOUNTER — Encounter: Payer: Self-pay | Admitting: Family

## 2019-04-09 VITALS — BP 84/55 | HR 64 | Temp 97.1°F | Resp 18 | Ht 60.0 in | Wt 91.0 lb

## 2019-04-09 DIAGNOSIS — D500 Iron deficiency anemia secondary to blood loss (chronic): Secondary | ICD-10-CM

## 2019-04-09 DIAGNOSIS — D631 Anemia in chronic kidney disease: Secondary | ICD-10-CM

## 2019-04-09 DIAGNOSIS — D508 Other iron deficiency anemias: Secondary | ICD-10-CM

## 2019-04-09 DIAGNOSIS — N189 Chronic kidney disease, unspecified: Secondary | ICD-10-CM | POA: Diagnosis not present

## 2019-04-09 LAB — CBC WITH DIFFERENTIAL (CANCER CENTER ONLY)
Abs Immature Granulocytes: 0.06 10*3/uL (ref 0.00–0.07)
Basophils Absolute: 0.1 10*3/uL (ref 0.0–0.1)
Basophils Relative: 1 %
Eosinophils Absolute: 0.3 10*3/uL (ref 0.0–0.5)
Eosinophils Relative: 3 %
HCT: 33 % — ABNORMAL LOW (ref 36.0–46.0)
Hemoglobin: 10.3 g/dL — ABNORMAL LOW (ref 12.0–15.0)
Immature Granulocytes: 1 %
Lymphocytes Relative: 8 %
Lymphs Abs: 0.6 10*3/uL — ABNORMAL LOW (ref 0.7–4.0)
MCH: 28.8 pg (ref 26.0–34.0)
MCHC: 31.2 g/dL (ref 30.0–36.0)
MCV: 92.2 fL (ref 80.0–100.0)
Monocytes Absolute: 0.5 10*3/uL (ref 0.1–1.0)
Monocytes Relative: 7 %
Neutro Abs: 5.9 10*3/uL (ref 1.7–7.7)
Neutrophils Relative %: 80 %
Platelet Count: 260 10*3/uL (ref 150–400)
RBC: 3.58 MIL/uL — ABNORMAL LOW (ref 3.87–5.11)
RDW: 12.9 % (ref 11.5–15.5)
WBC Count: 7.4 10*3/uL (ref 4.0–10.5)
nRBC: 0 % (ref 0.0–0.2)

## 2019-04-09 LAB — CMP (CANCER CENTER ONLY)
ALT: 6 U/L (ref 0–44)
AST: 14 U/L — ABNORMAL LOW (ref 15–41)
Albumin: 3.6 g/dL (ref 3.5–5.0)
Alkaline Phosphatase: 87 U/L (ref 38–126)
Anion gap: 5 (ref 5–15)
BUN: 22 mg/dL (ref 8–23)
CO2: 32 mmol/L (ref 22–32)
Calcium: 8.6 mg/dL — ABNORMAL LOW (ref 8.9–10.3)
Chloride: 101 mmol/L (ref 98–111)
Creatinine: 1.08 mg/dL — ABNORMAL HIGH (ref 0.44–1.00)
GFR, Est AFR Am: 59 mL/min — ABNORMAL LOW (ref >60)
GFR, Estimated: 51 mL/min — ABNORMAL LOW (ref >60)
Glucose, Bld: 113 mg/dL — ABNORMAL HIGH (ref 70–99)
Potassium: 4.3 mmol/L (ref 3.5–5.1)
Sodium: 138 mmol/L (ref 135–145)
Total Bilirubin: 0.4 mg/dL (ref 0.3–1.2)
Total Protein: 6.8 g/dL (ref 6.5–8.1)

## 2019-04-09 LAB — RETICULOCYTES
Immature Retic Fract: 2.9 % (ref 2.3–15.9)
RBC.: 3.53 MIL/uL — ABNORMAL LOW (ref 3.87–5.11)
Retic Count, Absolute: 24.7 10*3/uL (ref 19.0–186.0)
Retic Ct Pct: 0.7 % (ref 0.4–3.1)

## 2019-04-09 MED ORDER — HEPARIN SOD (PORK) LOCK FLUSH 100 UNIT/ML IV SOLN
500.0000 [IU] | Freq: Once | INTRAVENOUS | Status: AC | PRN
  Administered 2019-04-09: 500 [IU]
  Filled 2019-04-09: qty 5

## 2019-04-09 MED ORDER — SODIUM CHLORIDE 0.9% FLUSH
10.0000 mL | INTRAVENOUS | Status: DC | PRN
  Administered 2019-04-09: 10 mL
  Filled 2019-04-09: qty 10

## 2019-04-09 MED ORDER — ALTEPLASE 2 MG IJ SOLR
2.0000 mg | Freq: Once | INTRAMUSCULAR | Status: DC | PRN
  Filled 2019-04-09: qty 2

## 2019-04-09 MED ORDER — HEPARIN SOD (PORK) LOCK FLUSH 100 UNIT/ML IV SOLN
500.0000 [IU] | Freq: Once | INTRAVENOUS | Status: DC | PRN
  Filled 2019-04-09: qty 5

## 2019-04-09 MED ORDER — SODIUM CHLORIDE 0.9% FLUSH
10.0000 mL | Freq: Once | INTRAVENOUS | Status: DC | PRN
  Filled 2019-04-09: qty 10

## 2019-04-09 MED ORDER — EPOETIN ALFA-EPBX 40000 UNIT/ML IJ SOLN
40000.0000 [IU] | Freq: Once | INTRAMUSCULAR | Status: AC
  Administered 2019-04-09: 40000 [IU] via SUBCUTANEOUS

## 2019-04-09 MED ORDER — EPOETIN ALFA-EPBX 40000 UNIT/ML IJ SOLN
INTRAMUSCULAR | Status: AC
  Filled 2019-04-09: qty 1

## 2019-04-09 NOTE — Progress Notes (Signed)
Hematology and Oncology Follow Up Visit  [name] [id number] 11/20/1945 74 y.o. 04/09/2019   Principle Diagnosis:  Iron deficiency anemia Erythropoietin deficient anemia  Current Therapy:   IV iron as indicated Retacrit 40,000 units SQ for hgb < 11   Interim History:  [name] is here today for follow-up. She is symptomatic with fatigue, headaches and increased SOB. She is not in distress at this time.  She is on 4 L supplemental O2 24 hours a day.  She has not noted any blood loss. No bruising or petechiae.  No fever, chills, n/v, cough, rash, dizziness, chest pain, palpitations, abdominal pain or changes in bowel or bladder habits.  No swelling, tenderness, numbness or tingling in her extremities.  No falls or syncope.  She states that she has a good appetites and is staying properly hydrated. Her weight is stable.   ECOG Performance Status: 1 - Symptomatic but completely ambulatory  Medications:  Allergies as of 04/09/2019      Reactions   Dabigatran Etexilate Mesylate Other (See Comments)   INTERNAL BLEEDING-pradaxa   Augmentin [amoxicillin-pot Clavulanate] Diarrhea, Nausea Only   Talwin [pentazocine] Other (See Comments)   hallucinations   Lactate Rash      Medication List       Accurate as of April 09, 2019 11:46 AM. If you have any questions, ask your nurse or doctor.        acetaminophen 325 MG tablet Commonly known as: TYLENOL Take 2 tablets (650 mg total) by mouth every 4 (four) hours as needed.   albuterol 108 (90 Base) MCG/ACT inhaler Commonly known as: VENTOLIN HFA Inhale 2 puffs into the lungs every 6 (six) hours as needed for wheezing or shortness of breath.   amLODipine 2.5 MG tablet Commonly known as: NORVASC Take 1 tablet (2.5 mg total) by mouth daily.   apixaban 5 MG Tabs tablet Commonly known as: Eliquis Take 1 tablet (5 mg total) by mouth 2 (two) times daily.   atorvastatin 20 MG tablet Commonly known as: LIPITOR TAKE 1  TABLET BY MOUTH ONCE DAILY AT  6PM   carvedilol 25 MG tablet Commonly known as: COREG Take 1 tablet (25 mg total) by mouth 2 (two) times daily.   denosumab 60 MG/ML Sosy injection Commonly known as: PROLIA Inject 60 mg into the skin every 6 (six) months.   doxycycline 100 MG tablet Commonly known as: VIBRA-TABS Take 1 tablet (100 mg total) by mouth 2 (two) times daily.   furosemide 40 MG tablet Commonly known as: LASIX TAKE 1 TABLET BY MOUTH IN THE MORNING   gabapentin 600 MG tablet Commonly known as: NEURONTIN Take 1 tablet by mouth 4 times daily   Incruse Ellipta 62.5 MCG/INH Aepb Generic drug: umeclidinium bromide Inhale 1 puff into the lungs daily.   levofloxacin 500 MG tablet Commonly known as: LEVAQUIN Take 1 tablet (500 mg total) by mouth daily.   lidocaine-prilocaine cream Commonly known as: EMLA Apply 1 application topically as needed.   nitroGLYCERIN 0.4 MG SL tablet Commonly known as: NITROSTAT Place 1 tablet (0.4 mg total) under the tongue every 5 (five) minutes as needed. CHEST PAIN   oxyCODONE-acetaminophen 10-325 MG tablet Commonly known as: PERCOCET Take 1 tablet by mouth every 4 (four) hours as needed. PAIN   pantoprazole 40 MG tablet Commonly known as: PROTONIX Take 1 tablet by mouth twice daily   promethazine 25 MG tablet Commonly known as: PHENERGAN TAKE ONE TABLET BY MOUTH EVERY 8 HOURS AS NEEDED  FOR NAUSEA   sodium chloride HYPERTONIC 3 % nebulizer solution Take by nebulization 2 (two) times daily.   tiZANidine 4 MG tablet Commonly known as: ZANAFLEX Take 1 tablet by mouth three times daily as needed for muscle spasm   triamcinolone ointment 0.1 % Commonly known as: KENALOG APPLY TO SKIN TWICE DAILY FOR 14 DAYS   zolpidem 5 MG tablet Commonly known as: AMBIEN 1 po qhs prn       Allergies:  Allergies  Allergen Reactions  . Dabigatran Etexilate Mesylate Other (See Comments)    INTERNAL BLEEDING-pradaxa  . Augmentin  [Amoxicillin-Pot Clavulanate] Diarrhea and Nausea Only  . Talwin [Pentazocine] Other (See Comments)    hallucinations  . Lactate Rash    Past Medical History, Surgical history, Social history, and Family History were reviewed and updated.  Review of Systems: All other 10 point review of systems is negative.   Physical Exam:  vitals were not taken for this visit.   Wt Readings from Last 3 Encounters:  03/09/19 91 lb 1.3 oz (41.3 kg)  02/23/19 89 lb 12.8 oz (40.7 kg)  12/16/18 94 lb (42.6 kg)    Ocular: Sclerae unicteric, pupils equal, round and reactive to light Ear-nose-throat: Oropharynx clear, dentition fair Lymphatic: No cervical or supraclavicular adenopathy Lungs no rales or rhonchi, good excursion bilaterally Heart regular rate and rhythm, no murmur appreciated Abd soft, nontender, positive bowel sounds, no liver or spleen tip palpated on exam, no fluid wave  MSK no focal spinal tenderness, no joint edema Neuro: non-focal, well-oriented, appropriate affect Breasts: Deferred   Lab Results  Component Value Date   WBC 6.7 03/09/2019   HGB 11.3 (L) 03/09/2019   HCT 36.8 03/09/2019   MCV 94.1 03/09/2019   PLT 266 03/09/2019   Lab Results  Component Value Date   FERRITIN 562 (H) 03/09/2019   IRON 94 03/09/2019   TIBC 210 (L) 03/09/2019   UIBC 116 (L) 03/09/2019   IRONPCTSAT 45 03/09/2019   Lab Results  Component Value Date   RETICCTPCT 0.7 03/09/2019   RBC 3.91 03/09/2019   RBC 3.90 03/09/2019   RETICCTABS 64.6 04/01/2009   No results found for: KPAFRELGTCHN, LAMBDASER, KAPLAMBRATIO No results found for: IGGSERUM, IGA, IGMSERUM No results found for: Kathrynn Ducking, MSPIKE, SPEI   Chemistry      Component Value Date/Time   NA 139 03/09/2019 1028   K 4.8 03/09/2019 1028   CL 102 03/09/2019 1028   CO2 33 (H) 03/09/2019 1028   BUN 22 03/09/2019 1028   CREATININE 0.90 03/09/2019 1028   CREATININE 0.91 03/16/2016  1539      Component Value Date/Time   CALCIUM 8.6 (L) 03/09/2019 1028   ALKPHOS 102 03/09/2019 1028   AST 17 03/09/2019 1028   ALT 9 03/09/2019 1028   BILITOT 0.4 03/09/2019 1028       Impression and Plan: [name] is a very pleasant 74yo caucasian female with iron deficiency anemia secondary to malabsorption and possible GI blood loss with Eliquis. She received Retacrit today for Hgb 10.3.  Iron studies are pending. We will replace if needed.  We will see her back in another month for follow-up.  She will contact our office with any questions or concerns. We can certainly see her sooner if needed.   Laverna Peace, NP 1/21/202111:46 AM

## 2019-04-09 NOTE — Patient Instructions (Signed)

## 2019-04-09 NOTE — Telephone Encounter (Signed)
Appointments scheduled calendar printed per 1/21 los

## 2019-04-09 NOTE — Patient Instructions (Signed)
Tunneled Central Venous Catheter Flushing Guide  It is important to flush your tunneled central venous catheter each time you use it, both before and after you use it. Flushing your catheter will help prevent it from clogging. What are the risks? Risks may include:  Infection.  Air getting into the catheter and bloodstream. Supplies needed:  A clean pair of gloves.  A disinfecting wipe. Use an alcohol wipe, chlorhexidine wipe, or iodine wipe as told by your health care provider.  A 10 mL syringe that has been prefilled with saline solution.  An empty 10 mL syringe, if a substance called heparin was injected into your catheter. How to flush your catheter When you flush your catheter, make sure you follow any specific instructions from your health care provider or the manufacturer. These are general guidelines. Flushing your catheter before use If there is heparin in your catheter: 1. Wash your hands with soap and water. 2. Put on gloves. 3. Scrub the injection cap for a minimum of 15 seconds with a disinfecting wipe. 4. Unclamp the catheter. 5. Attach the empty syringe to the injection cap. 6. Pull the syringe plunger back and withdraw 10 mL of blood. 7. Place the syringe into an appropriate waste container. 8. Scrub the injection cap for 15 seconds with a disinfecting wipe. 9. Attach the prefilled syringe to the injection cap. 10. Flush the catheter by pushing the plunger forward until all the liquid from the syringe is in the catheter. 11. Remove the syringe from the injection cap. 12. Clamp the catheter. If there is no heparin in your catheter: 1. Wash your hands with soap and water. 2. Put on gloves. 3. Scrub the injection cap for 15 seconds with a disinfecting wipe. 4. Unclamp the catheter. 5. Attach the prefilled syringe to the injection cap. 6. Flush the catheter by pushing the plunger forward until 5 mL of the liquid from the syringe is in the catheter. 7. Pull back on  the syringe until you see blood in the catheter. 8. If you have been asked to collect any blood, follow your health care provider's instructions. Otherwise, flush the catheter with the rest of the solution from the syringe. 9. Remove the syringe from the injection cap. 10. Clamp the catheter.  Flushing your catheter after use 1. Wash your hands with soap and water. 2. Put on gloves. 3. Scrub the injection cap for 15 seconds with a disinfecting wipe. 4. Unclamp the catheter. 5. Attach the prefilled syringe to the injection cap. 6. Flush the catheter by pushing the plunger forward until all of the liquid from the syringe is in the catheter. 7. Remove the syringe from the injection cap. 8. Clamp the catheter. Problems and solutions  If blood cannot be completely cleared from the injection cap, you may need to have the injection cap replaced.  If the catheter is difficult to flush, use the pulsing method. The pulsing method involves pushing only a few milliliters of solution into the catheter at a time and pausing between pushes.  If you do not see blood in the catheter when you pull back on the syringe, change your body position, such as by raising your arms above your head. Take a deep breath and cough. Then, pull back on the syringe. If you still do not see blood, flush the catheter with a small amount of solution. Then, change positions again and take a breath or cough. Pull back on the syringe again. If you still do not see   blood, finish flushing the catheter and contact your health care provider. Do not use your catheter until your health care provider says it is okay. General tips  Have someone help you flush your catheter, if possible.  Do not force fluid through your catheter.  Do not use a syringe that is larger or smaller than 10 mL. Using a smaller syringe can make the catheter burst.  Do not use your catheter without flushing it first if it has heparin in it. Contact a health  care provider if:  You cannot see any blood in the catheter when you flush it before using it.  Your catheter is difficult to flush. Get help right away if:  You cannot flush the catheter.  The catheter leaks when you flush it or when there is fluid in it.  There are cracks or breaks in the catheter. Summary  It is important to flush your tunneled central venous catheter each time you use it, both before and after you use it.  Scrub the injection cap for 15 seconds with a disinfecting wipe before and after you flush it.  When you flush your catheter, make sure you follow any specific instructions from your health care provider or the manufacturer.  Get help right away if you cannot flush the catheter. This information is not intended to replace advice given to you by your health care provider. Make sure you discuss any questions you have with your health care provider. Document Revised: 11/28/2018 Document Reviewed: 05/21/2018 Elsevier Patient Education  2020 [employer]  

## 2019-04-10 DIAGNOSIS — R05 Cough: Secondary | ICD-10-CM | POA: Diagnosis not present

## 2019-04-10 DIAGNOSIS — J471 Bronchiectasis with (acute) exacerbation: Secondary | ICD-10-CM | POA: Diagnosis not present

## 2019-04-10 DIAGNOSIS — J449 Chronic obstructive pulmonary disease, unspecified: Secondary | ICD-10-CM | POA: Diagnosis not present

## 2019-04-10 DIAGNOSIS — I4891 Unspecified atrial fibrillation: Secondary | ICD-10-CM | POA: Diagnosis not present

## 2019-04-10 LAB — IRON AND TIBC
Iron: 98 ug/dL (ref 41–142)
Saturation Ratios: 59 % — ABNORMAL HIGH (ref 21–57)
TIBC: 165 ug/dL — ABNORMAL LOW (ref 236–444)
UIBC: 67 ug/dL — ABNORMAL LOW (ref 120–384)

## 2019-04-10 LAB — FERRITIN: Ferritin: 734 ng/mL — ABNORMAL HIGH (ref 11–307)

## 2019-04-13 DIAGNOSIS — J449 Chronic obstructive pulmonary disease, unspecified: Secondary | ICD-10-CM | POA: Diagnosis not present

## 2019-04-13 DIAGNOSIS — I4891 Unspecified atrial fibrillation: Secondary | ICD-10-CM | POA: Diagnosis not present

## 2019-04-14 ENCOUNTER — Other Ambulatory Visit: Payer: Self-pay | Admitting: Family Medicine

## 2019-04-14 DIAGNOSIS — R52 Pain, unspecified: Secondary | ICD-10-CM

## 2019-04-14 MED ORDER — OXYCODONE-ACETAMINOPHEN 10-325 MG PO TABS: 1.0000 | ORAL_TABLET | ORAL | 0 refills | Status: DC | PRN

## 2019-04-14 NOTE — Telephone Encounter (Signed)
Medication: oxyCODONE-acetaminophen (PERCOCET) 10-325 MG tablet - 3 days left  Has the patient contacted their pharmacy? No - controlled medication  Preferred Pharmacy (with phone number or street name):   [place], [place] Phone:  [phone]  Fax:  [phone]

## 2019-04-14 NOTE — Telephone Encounter (Signed)
Requesting: Percocet Contract: 11/06/2018 UDS: 10/2018 Last OV: 12/16/2018 with Paz Next OV: 05/07/19 Last Refill: 03/16/2019, #[id number] RF Database:   Please advise

## 2019-04-15 NOTE — Progress Notes (Addendum)
Virtual Visit via Video Note  I connected with [name] on 04/16/19 at  9:00 AM EST by a video enabled telemedicine application and verified that I am speaking with the correct person using two identifiers.  Location: Patient: Home Provider: Office - [facility] - [address], [address], [place], [place] [address]  I discussed the limitations of evaluation and management by telemedicine and the availability of in person appointments. The patient expressed understanding and agreed to proceed. I also discussed with the patient that there may be a patient responsible charge related to this service. The patient expressed understanding and agreed to proceed.  Patient consented to consult via telephone: Yes People present and their role in pt care: Pt   History of Present Illness:  74 year old female former smoker followed in our office for COPD, chronic hypoxemic respiratory failure, bronchiectasis  Past medical history: Hypocalcemia, left bundle branch block, A. fib flutter, hypertension, chronic pain syndrome, osteoarthritis, chronic fatigue, GERD, insomnia, small bowel obstruction, plaque psoriasis (previously used methotrexate had recurrent respiratory infections) Smoking history: Former smoker.  Quit 1988.  20-pack-year smoking history. Maintenance: Incruse Ellipta  Patient of Dr. Halford Chessman   Chief complaint: 1 week follow up    74 year old female followed in our office for bronchiectasis.  Patient was treated 1 week ago is a bronchiectatic exacerbation.  She was previously treated with Augmentin prior to this.  She has now completed 1 week course of Levaquin.  She reports she is doing much better status post the Levaquin.  She also started using hypertonic saline.  She is still not having a productive cough.  She reports that her fatigue as well as her dry cough has improved.  She appears better in comparison to last week's televisit.  She has a scheduled high-resolution CT  chest on 04/27/2019 to further evaluate and assess her bronchiectasis.  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, diffuse cylindrical bronchiectasis, patchy tree-in-bud opacities and patchy nodule clear peribronchial vascular and peripheral foci of consolidation, follow-up high resolution posttreatment chest CT may be considered 6 to 12 months, stable chronic mediastinal and right hilar adenopathy most compatible with reactive adenopathy  06/01/2017-echocardiogram-LV ejection fraction 55 to 67%, grade 1 diastolic dysfunction, PA pressure 33  10/10/2011-pulmonary function test-FVC 1.47 (54% predicted), postbronchodilator ratio 64, postbronchodilator FEV1 1.15 (59% predicted), positive bronchodilator response, DLCO 5.3 (35% predicted)  Social History   Tobacco Use  Smoking Status Former Smoker  . Packs/day: 1.00  . Years: 20.00  . Pack years: 20.00  . Quit date: 03/19/1986  . Years since quitting: 33.0  Smokeless Tobacco Never Used   Immunization History  Administered Date(s) Administered  . DTaP 11/03/1996, 01/04/1997, 05/03/1997, 10/19/1997, 08/05/2001  . Fluad Quad(high Dose 65+) 11/06/2018  . HPV Quadrivalent 09/05/2009, 09/13/2010, 09/26/2011  . Hepatitis A, Ped/Adol-2 Dose 09/21/2004, 03/29/2005  . Hepatitis B, ped/adol 04/30/1996, 11/03/1996, 10/19/1997  . HiB (PRP-T) 11/03/1996, 01/04/1997, 05/03/1997, 10/19/1997  . IPV 11/03/1996, 01/04/1997, 05/03/1997, 08/05/2001  . Influenza Split 01/16/2011, 12/12/2011  . Influenza Whole 01/15/2007, 12/08/2007, 12/28/2008,  12/17/2009  . Influenza, High Dose Seasonal PF 11/28/2015, 12/07/2016, 11/15/2017  . Influenza, Seasonal, Injecte, Preservative Fre 12/09/2012  . Influenza,Quad,Nasal, Live 03/17/2007, 03/06/2009, 01/08/2010, 01/30/2011, 02/09/2012, 01/18/2013  . Influenza,inj,Quad  PF,6+ Mos 03/29/2005, 04/30/2005, 03/01/2006, 01/28/2008, 12/01/2013, 01/16/2014, 12/22/2014, 12/28/2014  . MMR 05/03/1997, 08/05/2001  . Meningococcal B, OMV 12/22/2014  . Pneumococcal Conjugate-13 06/10/2014, 11/15/2014  . Pneumococcal Polysaccharide-23 12/17/2009  . Tdap 09/17/2007, 05/15/2014, 12/09/2018  . Varicella 11/16/1997, 05/18/2008  . Zoster 12/31/2013    Assessment and Plan:  Bronchiectasis ([place]) Plan: Continue flutter valve Continue Mucinex Continue hypertonic saline nebs Complete high-resolution CT chest is ordered 4-week follow-up visit with our office  COPD with emphysema ([place]) Plan: Continue Incruse Continue rescue inhaler Continue oxygen therapy as prescribed  Chronic hypoxemic respiratory failure Plan: Continue oxygen therapy   Follow Up Instructions:  Return in about 4 weeks (around 05/14/2019), or if symptoms worsen or fail to improve, for Follow up with Dr. Halford Chessman, After Chest CT.    I discussed the assessment and treatment plan with the patient. The patient was provided an opportunity to ask questions and all were answered. The patient agreed with the plan and demonstrated an understanding of the instructions.   The patient was advised to call back or seek an in-person evaluation if the symptoms worsen or if the condition fails to improve as anticipated.  I provided 22 minutes of non-face-to-face time during this encounter.   Lauraine Rinne, NP

## 2019-04-16 ENCOUNTER — Encounter: Payer: Self-pay | Admitting: Pulmonary Disease

## 2019-04-16 ENCOUNTER — Telehealth (INDEPENDENT_AMBULATORY_CARE_PROVIDER_SITE_OTHER): Payer: HMO | Admitting: Pulmonary Disease

## 2019-04-16 ENCOUNTER — Other Ambulatory Visit: Payer: Self-pay | Admitting: Family Medicine

## 2019-04-16 DIAGNOSIS — J471 Bronchiectasis with (acute) exacerbation: Secondary | ICD-10-CM

## 2019-04-16 DIAGNOSIS — J9611 Chronic respiratory failure with hypoxia: Secondary | ICD-10-CM | POA: Diagnosis not present

## 2019-04-16 DIAGNOSIS — J439 Emphysema, unspecified: Secondary | ICD-10-CM

## 2019-04-16 DIAGNOSIS — G4700 Insomnia, unspecified: Secondary | ICD-10-CM

## 2019-04-16 NOTE — Assessment & Plan Note (Signed)
Plan: Continue Incruse Continue rescue inhaler Continue oxygen therapy as prescribed

## 2019-04-16 NOTE — Patient Instructions (Addendum)
You were seen today by Lauraine Rinne, NP  for:   1. Bronchiectasis   - sodium chloride HYPERTONIC 3 % nebulizer solution; Take by nebulization 2 (two) times daily.  Dispense: 750 mL; Refill: 3  Bronchiectasis: This is the medical term which indicates that you have damage, dilated airways making you more susceptible to respiratory infection. Use a flutter valve 10 breaths twice a day or 4 to 5 breaths 4-5 times a day to help clear mucus out Let us know if you have cough with change in mucus color or fevers or chills.  At that point you would need an antibiotic. Maintain a healthy nutritious diet, eating whole foods Take your medications as prescribed   Continue oxygen therapy as prescribed  >>>maintain oxygen saturations greater than 88 percent  >>>if unable to maintain oxygen saturations please contact the office  >>>do not smoke with oxygen  >>>can use nasal saline gel or nasal saline rinses to moisturize nose if oxygen causes dryness  Incruse Ellipta  >>> Take 1 puff daily in the morning right when you wake up >>>Rinse your mouth out after use >>>This is a daily maintenance inhaler, NOT a rescue inhaler >>>Contact our office if you are having difficulties affording or obtaining this medication >>>It is important for you to be able to take this daily and not miss any doses   Complete high-resolution CT chest as ordered   Follow Up:    Return in about 4 weeks (around 05/14/2019), or if symptoms worsen or fail to improve, for Follow up with Dr. Halford Chessman, After Chest CT.   Please do your part to reduce the spread of COVID-19:      Reduce your risk of any infection  and COVID19 by using the similar precautions used for avoiding the common cold or flu:  [name] Wash your hands often with soap and warm water for at least 20 seconds.  If soap and water are not readily available, use an alcohol-based hand sanitizer with at least 60% alcohol.  . If coughing or sneezing, cover your mouth and  nose by coughing or sneezing into the elbow areas of your shirt or coat, into a tissue or into your sleeve (not your hands). Langley Gauss A MASK when in public  . Avoid shaking hands with others and consider head nods or verbal greetings only. . Avoid touching your eyes, nose, or mouth with unwashed hands.  . Avoid close contact with people who are sick. . Avoid places or events with large numbers of people in one location, like concerts or sporting events. . If you have some symptoms but not all symptoms, continue to monitor at home and seek medical attention if your symptoms worsen. . If you are having a medical emergency, call 911.   [place] / e-Visit: [email or web address]         [facility]: [facility]: [phone]                   [facility]: [phone]     It is flu season:   >>> Best ways to protect herself from the flu: Receive the yearly flu vaccine, practice good hand hygiene washing with soap and also using hand sanitizer when available, eat a nutritious meals, get adequate rest, hydrate appropriately   Please contact the office if your symptoms worsen or you have concerns that you are not improving.   Thank  you for choosing [facility] for your healthcare, and for allowing [place] to partner with you on your healthcare journey. I am thankful to be able to provide care to you today.   Wyn Quaker FNP-C

## 2019-04-16 NOTE — Assessment & Plan Note (Signed)
Plan: Continue oxygen therapy 

## 2019-04-16 NOTE — Progress Notes (Signed)
Reviewed and agree with assessment/plan.   Marjani Kobel, MD [facility] 03/14/2016, 12:24 PM Pager:  [phone]  

## 2019-04-16 NOTE — Assessment & Plan Note (Signed)
Plan: Continue flutter valve Continue Mucinex Continue hypertonic saline nebs Complete high-resolution CT chest is ordered 4-week follow-up visit with our office

## 2019-04-17 NOTE — Telephone Encounter (Signed)
Requesting:Ambien Contract:none NPY:YFRT Last Visit:12/16/2018 Next Visit:05/07/2019 Last Refill:03/18/2019  Please Advise

## 2019-04-18 ENCOUNTER — Other Ambulatory Visit: Payer: Self-pay | Admitting: Internal Medicine

## 2019-04-19 DIAGNOSIS — I4891 Unspecified atrial fibrillation: Secondary | ICD-10-CM | POA: Diagnosis not present

## 2019-04-19 DIAGNOSIS — J449 Chronic obstructive pulmonary disease, unspecified: Secondary | ICD-10-CM | POA: Diagnosis not present

## 2019-04-22 ENCOUNTER — Other Ambulatory Visit: Payer: Self-pay | Admitting: Pharmacist

## 2019-04-22 NOTE — Patient Outreach (Signed)
[facility]) Care Management  04/22/2019  [name] [birth year] [id number]  Patient was called regarding CSNP medication review and med assistance evaluation. Patient received Eliquis, Proventil HFA, and Incruse Ellipta through patient assistance programs.  She is a 74 year old female with multiple medical conditions including but not limited to: A Fib, CAD, CHF, COPD, DJD, Hypertension,  Fibromyalgia, GERD, hyperlipidemia, osteoarthritis, RLS, and vitamin D deficiency.  Patient's medications were reviewed via telephone:  Medications Reviewed Today    Reviewed by Lauraine Rinne, NP (Nurse Practitioner) on 04/16/19 at [facility] List Status: <None>  Medication Order Taking? Sig Documenting Provider Last Dose Status Informant  acetaminophen (TYLENOL) 325 MG tablet [id number] No Take 2 tablets (650 mg total) by mouth every 4 (four) hours as needed. Saverio Danker, PA-C Taking Active   albuterol (VENTOLIN HFA) 108 (90 Base) MCG/ACT inhaler [id number] No Inhale 2 puffs into the lungs every 6 (six) hours as needed for wheezing or shortness of breath. Lauraine Rinne, NP Taking Active   amLODipine (NORVASC) 2.5 MG tablet [id number] No Take 1 tablet (2.5 mg total) by mouth daily. Minus Breeding, MD Taking Active   apixaban (ELIQUIS) 5 MG TABS tablet [id number] No Take 1 tablet (5 mg total) by mouth 2 (two) times daily. Carollee Herter, Kendrick Fries R, DO Taking Active   atorvastatin (LIPITOR) 20 MG tablet [id number] No TAKE 1 TABLET BY MOUTH ONCE DAILY AT  [address], Alferd Apa, DO Taking Active   carvedilol (COREG) 25 MG tablet [id number] No Take 1 tablet (25 mg total) by mouth 2 (two) times daily. Minus Breeding, MD Taking Active   denosumab (PROLIA) 60 MG/ML SOSY injection [id number] No Inject 60 mg into the skin every 6 (six) months. [provider] Taking Active   doxycycline (VIBRA-TABS) 100 MG tablet [id number] No Take 1 tablet (100 mg total) by mouth 2 (two) times daily. Lauraine Rinne, NP Taking Active   furosemide (LASIX) 40 MG tablet [id number] No TAKE 1 TABLET BY MOUTH IN THE [name], MD Taking Active   gabapentin (NEURONTIN) 600 MG tablet [id number] No Take 1 tablet by mouth 4 times daily Carollee Herter, Alferd Apa, DO Taking Active         Discontinued 06/05/11 1433 (Error)   levofloxacin (LEVAQUIN) 500 MG tablet [id number] No Take 1 tablet (500 mg total) by mouth daily. Lauraine Rinne, NP Taking Active   lidocaine-prilocaine (EMLA) cream [id number] No Apply 1 application topically as needed. Volanda Napoleon, MD Taking Active   nitroGLYCERIN (NITROSTAT) 0.4 MG SL tablet [id number] No Place 1 tablet (0.4 mg total) under the tongue every 5 (five) minutes as needed. CHEST PAIN  Patient not taking: Reported on 04/09/2019   Deboraha Sprang, MD Not Taking Active Self           Med Note (CANTER, [facility] D   Tue Dec 16, 2018 11:11 AM) PRN  oxyCODONE-acetaminophen (PERCOCET) 10-325 MG tablet [id number]  Take 1 tablet by mouth every 4 (four) hours as needed. PAIN Ann Held, DO  Active   pantoprazole (PROTONIX) 40 MG tablet [id number] No Take 1 tablet by mouth twice daily Gatha Mayer, MD Taking Active   promethazine (PHENERGAN) 25 MG tablet [id number] No TAKE ONE TABLET BY MOUTH EVERY 8 HOURS AS NEEDED FOR NAUSEA Gatha Mayer, MD Taking Active   sodium chloride HYPERTONIC 3 % nebulizer solution [id number] No Take by nebulization 2 (two) times daily. Lauraine Rinne,  NP Taking Active   tiZANidine (ZANAFLEX) 4 MG tablet [id number] No Take 1 tablet by mouth three times daily as needed for muscle spasm Carollee Herter, Alferd Apa, DO Taking Active   triamcinolone ointment (KENALOG) 0.1 % [id number] No APPLY TO SKIN TWICE DAILY FOR 14 DAYS [provider] Taking Active   umeclidinium bromide (INCRUSE ELLIPTA) 62.5 MCG/INH AEPB [id number] No Inhale 1 puff into the lungs daily. Lauraine Rinne, NP Taking Active   zolpidem (AMBIEN) 5 MG tablet [id number] No 1 po qhs prn  Ann Held, DO Taking Active          Plan: Will send new application for Proventil HFA. Eliquis through [employer] and Incruse through [employer] require patient's to spend at least 3% of their household income and $600 (respectively).  Will follow up with the patient in 3-4 months.  Elayne Guerin, PharmD, [place] Clinical Pharmacist [phone]

## 2019-04-23 ENCOUNTER — Other Ambulatory Visit: Payer: Self-pay | Admitting: Pharmacy Technician

## 2019-04-23 NOTE — Patient Outreach (Signed)
[facility]) Care Management  04/23/2019  [name] 1945/09/18 [id number]                                      Medication Assistance Referral  Referral From: [place]  Medication/Company: Proventil HFA / [employer] Patient application portion:  Education officer, museum portion: Magazine features editor to Wyn Quaker, NP Provider address/fax verified via: Office website     Follow up:  Will follow up with patient in 5-15 business days to confirm application(s) have been received.  [name] P. Mirian Casco, [place] Management [phone]

## 2019-04-27 ENCOUNTER — Ambulatory Visit (INDEPENDENT_AMBULATORY_CARE_PROVIDER_SITE_OTHER)
Admission: RE | Admit: 2019-04-27 | Discharge: 2019-04-27 | Disposition: A | Discharge status: Home / Self Care | Payer: HMO | Source: Ambulatory Visit | Attending: Pulmonary Disease | Admitting: Pulmonary Disease

## 2019-04-27 ENCOUNTER — Other Ambulatory Visit: Payer: Self-pay

## 2019-04-27 DIAGNOSIS — R05 Cough: Secondary | ICD-10-CM | POA: Diagnosis not present

## 2019-04-27 DIAGNOSIS — J471 Bronchiectasis with (acute) exacerbation: Secondary | ICD-10-CM

## 2019-04-27 DIAGNOSIS — R059 Cough, unspecified: Secondary | ICD-10-CM

## 2019-04-27 DIAGNOSIS — A312 Disseminated mycobacterium avium-intracellulare complex (DMAC): Secondary | ICD-10-CM | POA: Diagnosis not present

## 2019-04-27 DIAGNOSIS — J479 Bronchiectasis, uncomplicated: Secondary | ICD-10-CM | POA: Diagnosis not present

## 2019-04-29 ENCOUNTER — Telehealth: Payer: Self-pay | Admitting: Family Medicine

## 2019-04-29 NOTE — Chronic Care Management (AMB) (Signed)
  Chronic Care Management   Note  04/29/2019 Name: [name] MRN: [id number] DOB: September 23, 1945  [name] is a 74 y.o. year old female who is a primary care patient of Ann Held, DO. I reached out to [name] by phone today in response to a referral sent by [name]'s PCP, Carollee Herter, Alferd Apa, DO.   [name] was given information about Chronic Care Management services today including:  1. CCM service includes personalized support from designated clinical staff supervised by her physician, including individualized plan of care and coordination with other care providers 2. 24/7 contact phone numbers for assistance for urgent and routine care needs. 3. Service will only be billed when office clinical staff spend 20 minutes or more in a month to coordinate care. 4. Only one practitioner may furnish and bill the service in a calendar month. 5. The patient may stop CCM services at any time (effective at the end of the month) by phone call to the office staff. 6. The patient will be responsible for cost sharing (co-pay) of up to 20% of the service fee (after annual deductible is met).  Patient agreed to services and verbal consent obtained.   Follow up plan:   [name] [employer] Scheduler

## 2019-04-30 ENCOUNTER — Other Ambulatory Visit: Payer: Self-pay | Admitting: Cardiology

## 2019-04-30 ENCOUNTER — Other Ambulatory Visit: Payer: Self-pay

## 2019-04-30 DIAGNOSIS — I10 Essential (primary) hypertension: Secondary | ICD-10-CM

## 2019-04-30 DIAGNOSIS — E785 Hyperlipidemia, unspecified: Secondary | ICD-10-CM

## 2019-05-01 ENCOUNTER — Ambulatory Visit (INDEPENDENT_AMBULATORY_CARE_PROVIDER_SITE_OTHER): Payer: HMO | Admitting: *Deleted

## 2019-05-01 DIAGNOSIS — Z950 Presence of cardiac pacemaker: Secondary | ICD-10-CM

## 2019-05-01 LAB — CUP PACEART REMOTE DEVICE CHECK
Battery Remaining Longevity: 3 mo
Battery Voltage: 2.77 V
Brady Statistic AP VP Percent: 3.46 %
Brady Statistic AP VS Percent: 0.01 %
Brady Statistic AS VP Percent: 96.49 %
Brady Statistic AS VS Percent: 0.05 %
Brady Statistic RA Percent Paced: 3.4 %
Brady Statistic RV Percent Paced: 99.92 %
Date Time Interrogation Session: 20210212221442
Implantable Lead Implant Date: 20111005
Implantable Lead Implant Date: 20111005
Implantable Lead Implant Date: 20111005
Implantable Lead Location: 753858
Implantable Lead Location: 753859
Implantable Lead Location: 753860
Implantable Lead Model: 4196
Implantable Lead Model: 5076
Implantable Lead Model: 5076
Implantable Pulse Generator Implant Date: 20111005
Lead Channel Impedance Value: 342 Ohm
Lead Channel Impedance Value: 399 Ohm
Lead Channel Impedance Value: 418 Ohm
Lead Channel Impedance Value: 456 Ohm
Lead Channel Impedance Value: 456 Ohm
Lead Channel Impedance Value: 475 Ohm
Lead Channel Impedance Value: 513 Ohm
Lead Channel Impedance Value: 532 Ohm
Lead Channel Impedance Value: 684 Ohm
Lead Channel Pacing Threshold Amplitude: 0.5 V
Lead Channel Pacing Threshold Amplitude: 0.625 V
Lead Channel Pacing Threshold Pulse Width: 0.4 ms
Lead Channel Pacing Threshold Pulse Width: 0.4 ms
Lead Channel Sensing Intrinsic Amplitude: 0.875 mV
Lead Channel Sensing Intrinsic Amplitude: 0.875 mV
Lead Channel Sensing Intrinsic Amplitude: 4.875 mV
Lead Channel Sensing Intrinsic Amplitude: 4.875 mV
Lead Channel Setting Pacing Amplitude: 1.25 V
Lead Channel Setting Pacing Amplitude: 2 V
Lead Channel Setting Pacing Amplitude: 2.5 V
Lead Channel Setting Pacing Pulse Width: 0.4 ms
Lead Channel Setting Pacing Pulse Width: 0.8 ms
Lead Channel Setting Sensing Sensitivity: 0.9 mV

## 2019-05-02 NOTE — Progress Notes (Signed)
Monthly battery check

## 2019-05-04 ENCOUNTER — Other Ambulatory Visit: Payer: Self-pay | Admitting: Family Medicine

## 2019-05-04 ENCOUNTER — Ambulatory Visit: Payer: HMO | Admitting: Pharmacist

## 2019-05-04 ENCOUNTER — Other Ambulatory Visit: Payer: Self-pay

## 2019-05-04 DIAGNOSIS — R52 Pain, unspecified: Secondary | ICD-10-CM

## 2019-05-04 DIAGNOSIS — G4700 Insomnia, unspecified: Secondary | ICD-10-CM

## 2019-05-04 DIAGNOSIS — I480 Paroxysmal atrial fibrillation: Secondary | ICD-10-CM

## 2019-05-04 DIAGNOSIS — I5032 Chronic diastolic (congestive) heart failure: Secondary | ICD-10-CM

## 2019-05-04 DIAGNOSIS — E785 Hyperlipidemia, unspecified: Secondary | ICD-10-CM

## 2019-05-04 DIAGNOSIS — I10 Essential (primary) hypertension: Secondary | ICD-10-CM

## 2019-05-04 DIAGNOSIS — M810 Age-related osteoporosis without current pathological fracture: Secondary | ICD-10-CM

## 2019-05-04 NOTE — Chronic Care Management (AMB) (Signed)
Chronic Care Management Pharmacy  Name: [name]  MRN: [id number] DOB: 01/26/1946   Chief Complaint/ HPI  [name],  74 y.o. , female presents for their Initial CCM visit with the clinical pharmacist via telephone visit.  PCP : Ann Held, DO  Their chronic conditions include: Afib, COPD, HF, HTN, HLD, CAD, Chronic Pain Syndrome, GERD, Insomnia  Office Visits: 12/16/18 - Office visit w/ Dr. Larose Kells - Pt had all but 1 sutures removed from a forehead wound  11/06/18 - Office visit w/ Dr. Etter Sjogren - No med changes. Received flu vaccine.   Consult Visit: 05/01/19: Pacemaker battery check. Normal device function. 3 months to ERI. Recheck in 30 days.   04/23/19: [facility] (Jill Simcox, CPhT) - Proventil/[employer] application sent off. Follow up in 5-15 days to confirm applications received.   04/22/19 - [facility] (Elayne Guerin, PharmD, BCACP) - Proventil/[employer] application started. Eliquis/[employer] (pt to spend 3% of household income) and Incruse/GSK (pt to spend $600) have additional requirements for application.  04/16/19: Pulmonary video visit w/ Wyn Quaker, NP - 1 week post bronchiectatic exacerbation. Pt had trial of Doxy and most recently Levaquin with symptom improvement post Levaquin. Appears better than previous week's televisit. Has high-res CT of chest scheduled for 04/27/19 to further evaluate and assess her bronchiectasis. Bronchiectasis severity index score -8-moderate. Continue flutter valve, mucinex, hypertonic saline nebs, and complete CT. Continue incruse, rescue inhaler, and oxygen therapy. Return around 05/14/19.  04/09/19: Hem/Onc visit with Laverna Peace, NP for Epoetin-Alfa infusion.  04/06/19:  Pulmonary video visit w/ Wyn Quaker, NP - Bronchiectasis tx with levaquin and hypertonic saline nebs  03/24/19: Pulmonary video visit w/ Wyn Quaker, NP - Bronchiectasis tx with doxycycline  02/23/19: Cardio visit with Dr.  Percival Spanish - No med changes  01/01/19: Chronic Special Needs Program by [facility] w/ Peter Garter, RN - Reviewed goals listed with copy provided to patient and PCP  11/20/18: Spinal injection of Celestone (betamethasone acetate-betamethasone sodium phosphate) 64m  Hospitalization/ED Visits: 12/09/18 - Pt tripped over dog, fell, and hit her head on kitchen table. Denied LOC. Forehead laceration present. Laceration repaired. Head CT showed no acute intracranial abnormalities. Head CT did show small vessel chronic ischemic changes of deep cerebral white matter.   Medications: Outpatient Encounter Medications as of 05/04/2019  Medication Sig Note  . acetaminophen (TYLENOL) 650 MG CR tablet Take 650 mg by mouth every 8 (eight) hours as needed for pain.   .Marland Kitchenalbuterol (VENTOLIN HFA) 108 (90 Base) MCG/ACT inhaler Inhale 2 puffs into the lungs every 6 (six) hours as needed for wheezing or shortness of breath. 04/22/2019: Uses PROVENTIL HFA from [facility] [employer] [facility].  . amLODipine (NORVASC) 2.5 MG tablet Take 1 tablet (2.5 mg total) by mouth daily.   .Marland Kitchenapixaban (ELIQUIS) 5 MG TABS tablet Take 1 tablet (5 mg total) by mouth 2 (two) times daily.   .Marland Kitchenatorvastatin (LIPITOR) 20 MG tablet TAKE 1 TABLET BY MOUTH ONCE DAILY AT  6PM   . carvedilol (COREG) 25 MG tablet Take 1 tablet (25 mg total) by mouth 2 (two) times daily.   . furosemide (LASIX) 40 MG tablet TAKE 1 TABLET BY MOUTH IN THE MORNING   . gabapentin (NEURONTIN) 600 MG tablet Take 1 tablet by mouth 4 times daily   . lidocaine-prilocaine (EMLA) cream Apply 1 application topically as needed.   . nitrofurantoin, macrocrystal-monohydrate, (MACROBID) 100 MG capsule Take 100 mg by mouth at bedtime. UTI  prophylaxis prescribed by urology   . oxyCODONE-acetaminophen (PERCOCET) 10-325 MG tablet Take 1 tablet by mouth every 4 (four) hours as needed. PAIN   . pantoprazole (PROTONIX) 40 MG tablet Take 1 tablet by mouth twice daily   . tiZANidine (ZANAFLEX) 4  MG tablet Take 1 tablet by mouth three times daily as needed for muscle spasm 05/04/2019: Rarely uses  . triamcinolone ointment (KENALOG) 0.1 % APPLY TO SKIN TWICE DAILY FOR 14 DAYS   . umeclidinium bromide (INCRUSE ELLIPTA) 62.5 MCG/INH AEPB Inhale 1 puff into the lungs daily. 05/04/2019: Gets through [place] PAP  . zolpidem (AMBIEN) 5 MG tablet TAKE 1 TABLET BY MOUTH EVERY Rowena Moilanen AT BEDTIME AS NEEDED   . denosumab (PROLIA) 60 MG/ML SOSY injection Inject 60 mg into the skin every 6 (six) months.   . nitroGLYCERIN (NITROSTAT) 0.4 MG SL tablet Place 1 tablet (0.4 mg total) under the tongue every 5 (five) minutes as needed. CHEST PAIN (Patient not taking: Reported on 05/04/2019) 12/16/2018: PRN  . promethazine (PHENERGAN) 25 MG tablet TAKE ONE TABLET BY MOUTH EVERY 8 HOURS AS NEEDED FOR NAUSEA (Patient not taking: Reported on 05/04/2019) 05/04/2019: PRN  . sodium chloride HYPERTONIC 3 % nebulizer solution Take by nebulization 2 (two) times daily. (Patient not taking: Reported on 05/04/2019) 05/04/2019: Now using PRN  . [DISCONTINUED] iron dextran complex (INFED) 50 MG/ML injection Please give infed infusion (no test dose needed) over 4 hours per pharmacy calculated dose. Ht: 5'2 Wt: 99 pounds Hgb: 12    No facility-administered encounter medications on file as of 05/04/2019.   Immunization History  Administered Date(s) Administered  . DTaP 11/03/1996, 01/04/1997, 05/03/1997, 10/19/1997, 08/05/2001  . Fluad Quad(high Dose 65+) 11/06/2018  . HPV Quadrivalent 09/05/2009, 09/13/2010, 09/26/2011  . Hepatitis A, Ped/Adol-2 Dose 09/21/2004, 03/29/2005  . Hepatitis B, ped/adol 04/30/1996, 11/03/1996, 10/19/1997  . HiB (PRP-T) 11/03/1996, 01/04/1997, 05/03/1997, 10/19/1997  . IPV 11/03/1996, 01/04/1997, 05/03/1997, 08/05/2001  . Influenza Split 01/16/2011, 12/12/2011  . Influenza Whole 01/15/2007, 12/08/2007, 12/28/2008, 12/17/2009  . Influenza, High Dose Seasonal PF 11/28/2015, 12/07/2016, 11/15/2017  . Influenza,  Seasonal, Injecte, Preservative Fre 12/09/2012  . Influenza,Quad,Nasal, Live 03/17/2007, 03/06/2009, 01/08/2010, 01/30/2011, 02/09/2012, 01/18/2013  . Influenza,inj,Quad PF,6+ Mos 03/29/2005, 04/30/2005, 03/01/2006, 01/28/2008, 12/01/2013, 01/16/2014, 12/22/2014, 12/28/2014  . MMR 05/03/1997, 08/05/2001  . Meningococcal B, OMV 12/22/2014  . Pneumococcal Conjugate-13 06/10/2014, 11/15/2014  . Pneumococcal Polysaccharide-23 12/17/2009  . Tdap 09/17/2007, 05/15/2014, 12/09/2018  . Varicella 11/16/1997, 05/18/2008  . Zoster 12/31/2013    Current Diagnosis/Assessment:  Goals Addressed            This Visit's Progress   . Check Blood Pressure 2 to 3 times per week      . Complete Bone Density Scan (DEXA)       -Last reported DEXA from 03/17/2015 showed osteoporosis with recommendation to repeat in 2 years    . Complete Shingrix Vaccine Series      . Consider Getting COVID Vaccine Series      . Pharmacy Care Plan       Current Barriers:  . Chronic Disease Management support, education, and care coordination needs related to Afib, COPD, HF, HTN, HLD, CAD, Chronic Pain Syndrome, GERD, Insomnia  Pharmacist Clinical Goal(s):  . BP Goal <140/90 while not having hypotension or blood pressure less than 90/60 . Patient to get DEXA Scan . Patient to restart Prolia injections for osteoporosis . Patient to complete Shingrix vaccine series . Patient to complete COVID vaccine series if agreeable  Interventions: .  Comprehensive medication review performed. . Check blood pressure 2-3 times per week  Patient Self Care Activities:  . Patient verbalizes understanding of plan to do as stated above, Self administers medications as prescribed, Calls pharmacy for medication refills, and Calls provider office for new concerns or questions  Initial goal documentation     . Restart Prolia Injections for Osteoporosis        Social Hx Husband. 5 children (4 adopted (1 deceased), 1 biological) 2  god-children. 3 grandchildren and oldest Daughter lives in [place], [place]. Brother lives in [place], [place].   Gets her hair done every Friday.   PAP Meds = ~ 1 month supply remaining  Morning meds in ziploc bag PM meds in ziploc bag PRN meds on kitchen counter  AFIB   Patient is currently controlled via pacemaker.  Patient has failed these meds in past: amiodarone 267m Patient is currently controlled on the following medications: apixaban 569mBID  Plan -Continue current medications  ,  COPD / Asthma / Tobacco   Last spirometry score:  10/10/2011-pulmonary function test-FVC 1.47 (54% predicted), postbronchodilator ratio 64, postbronchodilator FEV1 1.15 (59% predicted), positive bronchodilator response, DLCO 5.3 (35% predicted)  Gold Grade: Gold 2 (FEV1 50-79%) Current COPD Classification:  B (high sx, <2 exacerbations/yr)  Eosinophil count:   Lab Results  Component Value Date/Time   EOSPCT 3 04/09/2019 12:03 PM   EOSPCT 1.6 04/01/2009 10:48 AM   EOSPCT 0.8 05/01/2007 08:45 AM  %                               Eos (Absolute):  Lab Results  Component Value Date/Time   EOSABS 0.3 04/09/2019 12:03 PM   EOSABS 0.1 04/01/2009 10:48 AM    Tobacco Status:  Social History   Tobacco Use  Smoking Status Former Smoker  . Packs/Journii Nierman: 1.00  . Years: 20.00  . Pack years: 20.00  . Quit date: 03/19/1986  . Years since quitting: 33.1  Smokeless Tobacco Never Used    Patient has failed these meds in past: None noted (Spiriva changed to InGraybar ElectricPatient is currently controlled on the following medications: incruse ellipta 62.109m61mnh once daily, proventil  Using maintenance inhaler regularly? Yes Frequency of rescue inhaler use:  daily after supper  Pt reports that her lung CT showed bacteria in lungs. Has televisit with Dr. SooHalford Chessman come up with plan  Plan -Continue current medications ,  Heart Failure   Type: Diastolic  Last ejection fraction: 06/01/2017  55-60% NYHA Class: I (no activity limitation) AHA HF Stage: A (HF risk factors present)  Patient has failed these meds in past: None noted Patient is currently controlled on the following medications: furosemide 53m62mily   Pt weighs daily Wt on 05/03/19 - 90.6  We discussed weighing daily; if you gain more than 3 pounds in one Stephonie Wilcoxen or 5 pounds in one week call your doctor  Plan -Continue current medications ,  Hypertension   BP today is:  Unable to assess due to phone visit  Office blood pressures are  BP Readings from Last 3 Encounters:  04/09/19 (!) 84/55  03/09/19 96/74  02/23/19 (!) 102/58    Patient has failed these meds in the past: None noted  Patient is currently controlled on the following medications: amlodipine 2.109mg 64mly, carvedilol 2109mg 59m Patient checks BP at home infrequently   Denies dizziness, lightheadedness  We discussed: Proper BP technique  Plan -Check blood pressure 2-3 times a week -Continue current medications   Hyperlipidemia   Lipid Panel     Component Value Date/Time   CHOL 112 08/18/2018 1308   TRIG 107.0 08/18/2018 1308   TRIG 76 03/11/2006 1059   HDL 36.80 (L) 08/18/2018 1308   CHOLHDL 3 08/18/2018 1308   VLDL 21.4 08/18/2018 1308   LDLCALC 53 08/18/2018 1308     ASCVD 10-year risk: Hx of ASCVD  Patient has failed these meds in past: None noted Patient is currently controlled on the following medications: atorvastatin 71m daily   Plan -Continue current medications    CAD    Patient has failed these meds in past: None Patient is currently controlled on the following medications: atorvastatin 256mdaily Nitrostat has not used in over a year, requests refill  Plan -Request refill of Nitrostat from Dr. LoEtter SjogrenContinue current medications  Chronic Pain Syndrome    Patient has failed these meds in past: None noted Patient is currently controlled on the following medications: gabapentin 60076m times daily,  oxycodone 10-325m22mtimes daily, tizanidine 4mg 42m (only takes BID), Acetaminophen 325 2 tabs PRN - uses every other Kambre Messner  Current Pain Score: 4-5 Usual Pain Score: 5 Worst Pain Score: 8  Pain score depends on activity. Standing at washer and dryer, cooking can cause pain  Plan -Continue current medications   GERD    Patient has failed these meds in past: omeprazole 20mg 62mient is currently controlled on the following medications: pantoprazole 40mg, 29mssner  Breakthrough Symptoms: None Breakthrough Treatment: N/A Triggers: Spicy   We discussed: Avoid triggers; spicey foods  Plan -Continue current medications  Insomnia    Patient has failed these meds in past: belsomra (due to cost), temazepam (lack of efficacy)  Patient is currently controlled on the following medications: zolpidem 5mg HS 10mset Difficulty: Yes Latency Difficulty: Yes  Feels that pain and fibromyalgia keeps her up. Has to sleep in recliner due to pain.  We discussed:  Sleep hygiene  Plan -Continue current medications  Osteoporosis   03/17/15: DEXA = Osteoporosis T-Scores Lumbar spine: -4.3 R femoral neck: -3.2 L femoral neck: -3.3  Patient has failed these meds in past: None noted Patient is currently uncontrolled on the following medications: Prolia 60mg q6 4mhs  Pt reports she has not had injection in over a year, but is interested in restarting therapy  Plan -Complete DEXA scan -Restart Prolia Injections  Vaccines   COVID Vaccine Discussed COVID vaccine information. Pt is indecisive and would like to do more research about it with her husband They would like for more studies to come out  Shingrix Vaccine Discussed Shingrix vaccine information.  Pt is very interested in getting this vaccine series. Informed pt it is usually covered under insurance better at the pharmacy. Pt states she would like to wait until the pharmacy is not as busy/when COVID is under more  control  Plan -Continue researching information about COVID vaccine for further consideration -Completed Shingrix vaccine series when comfortable going into the pharmacy setting    Miscellaneous Meds Triamcinolone ointment - rarely uses (for psoriasis)

## 2019-05-04 NOTE — Patient Instructions (Addendum)
Visit Information  Goals Addressed            This Visit's Progress   . Check Blood Pressure 2 to 3 times per week      . Complete Bone Density Scan (DEXA)       -Last reported DEXA from 03/17/2015 showed osteoporosis with recommendation to repeat in 2 years    . Complete Shingrix Vaccine Series      . Consider Getting COVID Vaccine Series      . Pharmacy Care Plan       Current Barriers:  . Chronic Disease Management support, education, and care coordination needs related to Afib, COPD, HF, HTN, HLD, CAD, Chronic Pain Syndrome, GERD, Insomnia  Pharmacist Clinical Goal(s):  . BP Goal <140/90 while not having hypotension or blood pressure less than 90/60 . Patient to get DEXA Scan . Patient to restart Prolia injections for osteoporosis . Patient to complete Shingrix vaccine series . Patient to complete COVID vaccine series if agreeable  Interventions: . Comprehensive medication review performed. . Check blood pressure 2-3 times per week  Patient Self Care Activities:  . Patient verbalizes understanding of plan to do as stated above, Self administers medications as prescribed, Calls pharmacy for medication refills, and Calls provider office for new concerns or questions  Initial goal documentation     . Restart Prolia Injections for Osteoporosis         [name] was given information about Chronic Care Management services today including:  1. CCM service includes personalized support from designated clinical staff supervised by her physician, including individualized plan of care and coordination with other care providers 2. 24/7 contact phone numbers for assistance for urgent and routine care needs. 3. Service will only be billed when office clinical staff spend 20 minutes or more in a month to coordinate care. 4. Only one practitioner may furnish and bill the service in a calendar month. 5. The patient may stop CCM services at any time (effective at the end of the month)  by phone call to the office staff. 6. The patient will be responsible for cost sharing (co-pay) of up to 20% of the service fee (after annual deductible is met).  Patient agreed to services and verbal consent obtained.   Print copy of patient instructions provided.  The pharmacy team will reach out to the patient again over the next 30 days.   De Blanch, PharmD Clinical Pharmacist [facility] [phone]   Blood Pressure Record Sheet To take your blood pressure, you will need a blood pressure machine. You can buy a blood pressure machine (blood pressure monitor) at your clinic, drug store, or online. When choosing one, consider:  An automatic monitor that has an arm cuff.  A cuff that wraps snugly around your upper arm. You should be able to fit only one finger between your arm and the cuff.  A device that stores blood pressure reading results.  Do not choose a monitor that measures your blood pressure from your wrist or finger. Follow your health care provider's instructions for how to take your blood pressure. To use this form:  Get one reading in the morning (a.m.) before you take any medicines.  Get one reading in the evening (p.m.) before supper.  Take at least 2 readings with each blood pressure check. This makes sure the results are correct. Wait 1-2 minutes between measurements.  Write down the results in the spaces on this form.  Repeat  this once a week, or as told by your health care provider.  _Make a follow-up appointment with your health care provider to discuss the results. Blood pressure log Date: _______________________  a.m. _____________________(1st reading) _____________________(2nd reading)  p.m. _____________________(1st reading) _____________________(2nd reading) Date: _______________________  a.m. _____________________(1st reading) _____________________(2nd reading)  p.m. _____________________(1st reading)  _____________________(2nd reading) Date: _______________________  a.m. _____________________(1st reading) _____________________(2nd reading)  p.m. _____________________(1st reading) _____________________(2nd reading) Date: _______________________  a.m. _____________________(1st reading) _____________________(2nd reading)  p.m. _____________________(1st reading) _____________________(2nd reading) Date: _______________________  a.m. _____________________(1st reading) _____________________(2nd reading)  p.m. _____________________(1st reading) _____________________(2nd reading) This information is not intended to replace advice given to you by your health care provider. Make sure you discuss any questions you have with your health care provider. Document Revised: 05/03/2017 Document Reviewed: 03/05/2017 Elsevier Patient Education  [employer].   How to Take Your Blood Pressure You can take your blood pressure at home with a machine. You may need to check your blood pressure at home:  To check if you have high blood pressure (hypertension).  To check your blood pressure over time.  To make sure your blood pressure medicine is working. Supplies needed: You will need a blood pressure machine, or monitor. You can buy one at a drugstore or online. When choosing one:  Choose one with an arm cuff.  Choose one that wraps around your upper arm. Only one finger should fit between your arm and the cuff.  Do not choose one that measures your blood pressure from your wrist or finger. Your doctor can suggest a monitor. How to prepare Avoid these things for 30 minutes before checking your blood pressure:  Drinking caffeine.  Drinking alcohol.  Eating.  Smoking.  Exercising. Five minutes before checking your blood pressure:  Pee.  Sit in a dining chair. Avoid sitting in a soft couch or armchair.  Be quiet. Do not talk. How to take your blood pressure Follow the  instructions that came with your machine. If you have a digital blood pressure monitor, these may be the instructions: 1. Sit up straight. 2. Place your feet on the floor. Do not cross your ankles or legs. 3. Rest your left arm at the level of your heart. You may rest it on a table, desk, or chair. 4. Pull up your shirt sleeve. 5. Wrap the blood pressure cuff around the upper part of your left arm. The cuff should be 1 inch (2.5 cm) above your elbow. It is best to wrap the cuff around bare skin. 6. Fit the cuff snugly around your arm. You should be able to place only one finger between the cuff and your arm. 7. Put the cord inside the groove of your elbow. 8. Press the power button. 9. Sit quietly while the cuff fills with air and loses air. 10. Write down the numbers on the screen. 11. Wait 2-3 minutes and then repeat steps 1-10. What do the numbers mean? Two numbers make up your blood pressure. The first number is called systolic pressure. The second is called diastolic pressure. An example of a blood pressure reading is "120 over 80" (or 120/80). If you are an adult and do not have a medical condition, use this guide to find out if your blood pressure is normal: Normal  First number: below 120.  Second number: below 80. Elevated  First number: 120-129.  Second number: below 80. Hypertension stage 1  First number: 130-139.  Second number: 80-89. Hypertension stage 2  First number:  140 or above.  Second number: 51 or above. Your blood pressure is above normal even if only the top or bottom number is above normal. Follow these instructions at home:  Check your blood pressure as often as your doctor tells you to.  Take your monitor to your next doctor's appointment. Your doctor will: ? Make sure you are using it correctly. ? Make sure it is working right.  Make sure you understand what your blood pressure numbers should be.  Tell your doctor if your medicines are causing  side effects. Contact a doctor if:  Your blood pressure keeps being high. Get help right away if:  Your first blood pressure number is higher than 180.  Your second blood pressure number is higher than 120. This information is not intended to replace advice given to you by your health care provider. Make sure you discuss any questions you have with your health care provider. Document Revised: 02/15/2017 Document Reviewed: 08/12/2015 Elsevier Patient Education  2020 [employer].

## 2019-05-05 ENCOUNTER — Other Ambulatory Visit: Payer: Self-pay | Admitting: Pharmacy Technician

## 2019-05-05 NOTE — Patient Outreach (Signed)
[facility]) Care Management  05/05/2019  ULAH OLMO 11-Aug-1945 [id number]    Received both patient and provider portion(s) of patient assistance application(s) for Proventil HFA. Mailed completed application and required documents into [employer].  Will follow up with company(ies) in 20-30 business days to check status of application(s).  Lemario Chaikin P. Lurleen Soltero, [place] Management [phone]

## 2019-05-07 ENCOUNTER — Other Ambulatory Visit: Payer: Self-pay

## 2019-05-07 ENCOUNTER — Encounter: Payer: Self-pay | Admitting: Family Medicine

## 2019-05-07 ENCOUNTER — Ambulatory Visit (INDEPENDENT_AMBULATORY_CARE_PROVIDER_SITE_OTHER): Payer: HMO | Admitting: Family Medicine

## 2019-05-07 DIAGNOSIS — G4700 Insomnia, unspecified: Secondary | ICD-10-CM

## 2019-05-07 DIAGNOSIS — E785 Hyperlipidemia, unspecified: Secondary | ICD-10-CM

## 2019-05-07 DIAGNOSIS — R52 Pain, unspecified: Secondary | ICD-10-CM

## 2019-05-07 MED ORDER — TIZANIDINE HCL 4 MG PO TABS: 4.0000 mg | ORAL_TABLET | Freq: Three times a day (TID) | ORAL | 1 refills | Status: DC | PRN

## 2019-05-07 MED ORDER — ZOLPIDEM TARTRATE 5 MG PO TABS: ORAL_TABLET | ORAL | 0 refills | Status: DC

## 2019-05-07 MED ORDER — OXYCODONE-ACETAMINOPHEN 10-325 MG PO TABS: 1.0000 | ORAL_TABLET | ORAL | 0 refills | Status: DC | PRN

## 2019-05-07 MED ORDER — ATORVASTATIN CALCIUM 20 MG PO TABS: ORAL_TABLET | ORAL | 1 refills | Status: DC

## 2019-05-07 NOTE — Progress Notes (Signed)
Virtual Visit via Video Note  I connected with [name] on 05/07/19 at  1:20 PM EST by a video enabled telemedicine application and verified that I am speaking with the correct person using two identifiers.  Location: Patient: home alone Provider: home    I discussed the limitations of evaluation and management by telemedicine and the availability of in person appointments. The patient expressed understanding and agreed to proceed.  History of Present Illness: Pt is home with no new complaints   She will need refills  Will need contract  Database reviewed    Past Medical History:  Diagnosis Date  . Anemia   . Arthritis    right hip  . Atrial fibrillation ([place])   . CAD (coronary artery disease)    mild disease per cath in 2011  . CHF (congestive heart failure) ([place])   . COPD (chronic obstructive pulmonary disease) (HCC)    continuous O2- 2l  . Diverticulitis   . Elbow fracture, left aug 2012  . Erythropoietin deficiency anemia 06/19/2018  . Fibromyalgia    COUPLE OF TIMES A YEAR-PT HAS EPISODES OF CONFUSION-USUALLY INVOLVES DAY/NIGHT REVERSAL AND EPISODES OF TWITCHING AND FALLS--SEES DR. Jacelyn Grip - NEUROLOGIST-LAST SEEN 07/10/11  . Gastroparesis   . GERD (gastroesophageal reflux disease)   . H/O: GI bleed    from Pradaxa  . Hepatitis    hx of in high school   . HTN (hypertension)    under control; has been on med. x "years"  . Hx MRSA infection   . Hx of gastric ulcer   . Hx of stress fx aug 2011   right hip   . Iron malabsorption 08/14/2017  . LBBB (left bundle branch block)   . Oxygen dependent    2 liters via nasal cannula at all times  . Pacemaker    CRT therapy; followed by Dr. Caryl Comes  . Pleural effusion     s/p right thoracentesis 03/09  . Pneumonia - 03/09, 12/09, 02/10, 06/10   MOST RECENT FEB 2013  . Primary dilated cardiomyopathy (HCC)    EF 45 to 50% per echo in Jan 2012  . RLS (restless legs syndrome)   . Silent aspiration   . Small bowel obstruction  ([place])   . Urinary incontinence     -INTERSTIM IMPLANT NOT FUNCTIONING PER PT  . Venous embolism and thrombosis of subclavian vein ([place])    after pacemaker insertion Oct 2011  . Vitamin B12 deficiency    Current Outpatient Medications on File Prior to Visit  Medication Sig Dispense Refill  . acetaminophen (TYLENOL) 650 MG CR tablet Take 650 mg by mouth every 8 (eight) hours as needed for pain.    [name] albuterol (VENTOLIN HFA) 108 (90 Base) MCG/ACT inhaler Inhale 2 puffs into the lungs every 6 (six) hours as needed for wheezing or shortness of breath. 6.7 g 12  . amLODipine (NORVASC) 2.5 MG tablet Take 1 tablet (2.5 mg total) by mouth daily. 90 tablet 3  . apixaban (ELIQUIS) 5 MG TABS tablet Take 1 tablet (5 mg total) by mouth 2 (two) times daily. 180 tablet 0  . carvedilol (COREG) 25 MG tablet Take 1 tablet (25 mg total) by mouth 2 (two) times daily. 180 tablet 0  . denosumab (PROLIA) 60 MG/ML SOSY injection Inject 60 mg into the skin every 6 (six) months.    . furosemide (LASIX) 40 MG tablet TAKE 1 TABLET BY MOUTH IN THE MORNING 90 tablet 2  . gabapentin (NEURONTIN)  600 MG tablet Take 1 tablet by mouth 4 times daily 120 tablet 1  . lidocaine-prilocaine (EMLA) cream Apply 1 application topically as needed. 30 g 6  . nitrofurantoin, macrocrystal-monohydrate, (MACROBID) 100 MG capsule Take 100 mg by mouth at bedtime. UTI prophylaxis prescribed by urology    . nitroGLYCERIN (NITROSTAT) 0.4 MG SL tablet Place 1 tablet (0.4 mg total) under the tongue every 5 (five) minutes as needed. CHEST PAIN (Patient not taking: Reported on 05/04/2019) 25 tablet 5  . pantoprazole (PROTONIX) 40 MG tablet Take 1 tablet by mouth twice daily 180 tablet 0  . triamcinolone ointment (KENALOG) 0.1 % APPLY TO SKIN TWICE DAILY FOR 14 DAYS    . umeclidinium bromide (INCRUSE ELLIPTA) 62.5 MCG/INH AEPB Inhale 1 puff into the lungs daily. 1 each 12  . [DISCONTINUED] iron dextran complex (INFED) 50 MG/ML injection Please give infed  infusion (no test dose needed) over 4 hours per pharmacy calculated dose. Ht: 5'2 Wt: 99 pounds Hgb: 12 1 mL 0   No current facility-administered medications on file prior to visit.   Allergies  Allergen Reactions  . Dabigatran Etexilate Mesylate Other (See Comments)    INTERNAL BLEEDING-pradaxa  . Augmentin [Amoxicillin-Pot Clavulanate] Diarrhea and Nausea Only  . Talwin [Pentazocine] Other (See Comments)    hallucinations  . Lactate Rash   Social History   Socioeconomic History  . Marital status: Married    Spouse name: Not on file  . Number of children: 5  . Years of education: Not on file  . Highest education level: Not on file  Occupational History  . Occupation: disabled    Employer: RETIRED  Tobacco Use  . Smoking status: Former Smoker    Packs/day: 1.00    Years: 20.00    Pack years: 20.00    Quit date: 03/19/1986    Years since quitting: 33.1  . Smokeless tobacco: Never Used  Substance and Sexual Activity  . Alcohol use: No    Alcohol/week: 0.0 standard drinks  . Drug use: No  . Sexual activity: Yes  Other Topics Concern  . Not on file  Social History Narrative   Occupation: Disabled   Married - second time - biologic daughter (in [place]) from first marriage - adopted 4 3 alive with second marriage   Alcohol Use - no   Illicit Drug Use - no   Patient is a former smoker, quit 1988 x82yrs, <1ppd.    Daily Caffeine Use   01/20/2015 - updated   Social Determinants of Health   Financial Resource Strain:   . Difficulty of Paying Living Expenses: Not on file  Food Insecurity: No Food Insecurity  . Worried About Charity fundraiser in the Last Year: Never true  . Ran Out of Food in the Last Year: Never true  Transportation Needs: No Transportation Needs  . Lack of Transportation (Medical): No  . Lack of Transportation (Non-Medical): No  Physical Activity:   . Days of Exercise per Week: Not on file  . Minutes of Exercise per Session: Not on file  Stress:   .  Feeling of Stress : Not on file  Social Connections:   . Frequency of Communication with Friends and Family: Not on file  . Frequency of Social Gatherings with Friends and Family: Not on file  . Attends Religious Services: Not on file  . Active Member of Clubs or Organizations: Not on file  . Attends Archivist Meetings: Not on file  . Marital Status:  Not on file  Intimate Partner Violence:   . Fear of Current or Ex-Partner: Not on file  . Emotionally Abused: Not on file  . Physically Abused: Not on file  . Sexually Abused: Not on file   Observations/Objective: There were no vitals filed for this visit. Pt is in nad   Assessment and Plan: 1. Pain Database reviewed uds utd Will need contract  - tiZANidine (ZANAFLEX) 4 MG tablet; Take 1 tablet (4 mg total) by mouth 3 (three) times daily as needed. for muscle spams  Dispense: 90 tablet; Refill: 1 - oxyCODONE-acetaminophen (PERCOCET) 10-325 MG tablet; Take 1 tablet by mouth every 4 (four) hours as needed. PAIN  Dispense: 120 tablet; Refill: 0  2. Hyperlipidemia LDL goal <100 Encouraged heart healthy diet, increase exercise, avoid trans fats, consider a krill oil cap daily - atorvastatin (LIPITOR) 20 MG tablet; TAKE 1 TABLET BY MOUTH ONCE DAILY AT  6PM  Dispense: 90 tablet; Refill: 1 - Lipid panel; Future - Comprehensive metabolic panel; Future  3. Insomnia, unspecified type Stable Con't meds  - zolpidem (AMBIEN) 5 MG tablet; TAKE 1 TABLET BY MOUTH EVERY DAY AT BEDTIME AS NEEDED  Dispense: 30 tablet; Refill: 0 - Lipid panel; Future - Comprehensive metabolic panel; Future   Follow Up Instructions:    I discussed the assessment and treatment plan with the patient. The patient was provided an opportunity to ask questions and all were answered. The patient agreed with the plan and demonstrated an understanding of the instructions.   The patient was advised to call back or seek an in-person evaluation if the symptoms  worsen or if the condition fails to improve as anticipated.  I provided 30 minutes of non-face-to-face time during this encounter.   Ann Held, DO

## 2019-05-08 ENCOUNTER — Other Ambulatory Visit: Payer: Self-pay | Admitting: Family

## 2019-05-08 DIAGNOSIS — E785 Hyperlipidemia, unspecified: Secondary | ICD-10-CM

## 2019-05-11 ENCOUNTER — Inpatient Hospital Stay: Payer: HMO | Attending: Family

## 2019-05-11 ENCOUNTER — Encounter: Payer: Self-pay | Admitting: Family

## 2019-05-11 ENCOUNTER — Inpatient Hospital Stay: Payer: HMO

## 2019-05-11 ENCOUNTER — Other Ambulatory Visit: Payer: Self-pay

## 2019-05-11 ENCOUNTER — Inpatient Hospital Stay (HOSPITAL_BASED_OUTPATIENT_CLINIC_OR_DEPARTMENT_OTHER): Payer: HMO | Admitting: Family

## 2019-05-11 VITALS — BP 96/57 | HR 63 | Temp 97.6°F | Resp 18 | Ht 60.0 in | Wt 90.0 lb

## 2019-05-11 DIAGNOSIS — D508 Other iron deficiency anemias: Secondary | ICD-10-CM

## 2019-05-11 DIAGNOSIS — D631 Anemia in chronic kidney disease: Secondary | ICD-10-CM

## 2019-05-11 DIAGNOSIS — N189 Chronic kidney disease, unspecified: Secondary | ICD-10-CM | POA: Insufficient documentation

## 2019-05-11 DIAGNOSIS — D500 Iron deficiency anemia secondary to blood loss (chronic): Secondary | ICD-10-CM | POA: Diagnosis not present

## 2019-05-11 DIAGNOSIS — E785 Hyperlipidemia, unspecified: Secondary | ICD-10-CM

## 2019-05-11 LAB — CMP (CANCER CENTER ONLY)
ALT: 21 U/L (ref 0–44)
AST: 21 U/L (ref 15–41)
Albumin: 3.6 g/dL (ref 3.5–5.0)
Alkaline Phosphatase: 131 U/L — ABNORMAL HIGH (ref 38–126)
Anion gap: 5 (ref 5–15)
BUN: 21 mg/dL (ref 8–23)
CO2: 32 mmol/L (ref 22–32)
Calcium: 8.4 mg/dL — ABNORMAL LOW (ref 8.9–10.3)
Chloride: 103 mmol/L (ref 98–111)
Creatinine: 0.83 mg/dL (ref 0.44–1.00)
GFR, Est AFR Am: >60 mL/min (ref >60)
GFR, Estimated: >60 mL/min (ref >60)
Glucose, Bld: 113 mg/dL — ABNORMAL HIGH (ref 70–99)
Potassium: 4.5 mmol/L (ref 3.5–5.1)
Sodium: 140 mmol/L (ref 135–145)
Total Bilirubin: 0.3 mg/dL (ref 0.3–1.2)
Total Protein: 6.6 g/dL (ref 6.5–8.1)

## 2019-05-11 LAB — CBC WITH DIFFERENTIAL (CANCER CENTER ONLY)
Abs Immature Granulocytes: 0.04 10*3/uL (ref 0.00–0.07)
Basophils Absolute: 0.1 10*3/uL (ref 0.0–0.1)
Basophils Relative: 1 %
Eosinophils Absolute: 0.3 10*3/uL (ref 0.0–0.5)
Eosinophils Relative: 3 %
HCT: 32 % — ABNORMAL LOW (ref 36.0–46.0)
Hemoglobin: 9.9 g/dL — ABNORMAL LOW (ref 12.0–15.0)
Immature Granulocytes: 1 %
Lymphocytes Relative: 8 %
Lymphs Abs: 0.6 10*3/uL — ABNORMAL LOW (ref 0.7–4.0)
MCH: 28.7 pg (ref 26.0–34.0)
MCHC: 30.9 g/dL (ref 30.0–36.0)
MCV: 92.8 fL (ref 80.0–100.0)
Monocytes Absolute: 0.5 10*3/uL (ref 0.1–1.0)
Monocytes Relative: 6 %
Neutro Abs: 6.3 10*3/uL (ref 1.7–7.7)
Neutrophils Relative %: 81 %
Platelet Count: 246 10*3/uL (ref 150–400)
RBC: 3.45 MIL/uL — ABNORMAL LOW (ref 3.87–5.11)
RDW: 13.4 % (ref 11.5–15.5)
WBC Count: 7.8 10*3/uL (ref 4.0–10.5)
nRBC: 0 % (ref 0.0–0.2)

## 2019-05-11 LAB — RETICULOCYTES
Immature Retic Fract: 6.8 % (ref 2.3–15.9)
RBC.: 3.43 MIL/uL — ABNORMAL LOW (ref 3.87–5.11)
Retic Count, Absolute: 25 10*3/uL (ref 19.0–186.0)
Retic Ct Pct: 0.7 % (ref 0.4–3.1)

## 2019-05-11 LAB — LIPID PANEL
Cholesterol: 102 mg/dL (ref 0–200)
HDL: 29 mg/dL — ABNORMAL LOW (ref >40)
LDL Cholesterol: 54 mg/dL (ref 0–99)
Total CHOL/HDL Ratio: 3.5 RATIO
Triglycerides: 97 mg/dL (ref <150)
VLDL: 19 mg/dL (ref 0–40)

## 2019-05-11 MED ORDER — EPOETIN ALFA-EPBX 40000 UNIT/ML IJ SOLN
40000.0000 [IU] | Freq: Once | INTRAMUSCULAR | Status: AC
  Administered 2019-05-11: 40000 [IU] via SUBCUTANEOUS

## 2019-05-11 NOTE — Patient Instructions (Signed)

## 2019-05-11 NOTE — Patient Instructions (Signed)

## 2019-05-11 NOTE — Progress Notes (Signed)
Hematology and Oncology Follow Up Visit  [name] [id number] 1945-06-16 74 y.o. 05/11/2019   Principle Diagnosis:  Iron deficiency anemia Erythropoietin deficient anemia  Current Therapy:   IV iron as indicated Retacrit 40,000 units SQ for hgb < 11   Interim History:  [name] is here today for follow-up and Retacrit injection for Hgb 9.9. She is symptomatic with fatigue and has noted some weakness.  Thankfully, she has had no falls or syncopal episodes.  Her SOB is stable. She is still doing well on 4L [place] supplemental O2 24 hours a day.  She has not noted any episodes of bleeding. No bruising or petechiae.  No fever, chills, n/v, cough, rash, dizziness, chest pain, palpitations, abdominal pain or changes in bowel or bladder habits.  No swelling, tenderness, numbness or tingling in her extremities.  She states that she has a good appetite and is hydrating well. Her weight is stable  ECOG Performance Status: 1 - Symptomatic but completely ambulatory  Medications:  Allergies as of 05/11/2019      Reactions   Dabigatran Etexilate Mesylate Other (See Comments)   INTERNAL BLEEDING-pradaxa   Augmentin [amoxicillin-pot Clavulanate] Diarrhea, Nausea Only   Talwin [pentazocine] Other (See Comments)   hallucinations   Lactate Rash      Medication List       Accurate as of May 11, 2019 11:31 AM. If you have any questions, ask your nurse or doctor.        acetaminophen 650 MG CR tablet Commonly known as: TYLENOL Take 650 mg by mouth every 8 (eight) hours as needed for pain.   albuterol 108 (90 Base) MCG/ACT inhaler Commonly known as: VENTOLIN HFA Inhale 2 puffs into the lungs every 6 (six) hours as needed for wheezing or shortness of breath.   amLODipine 2.5 MG tablet Commonly known as: NORVASC Take 1 tablet (2.5 mg total) by mouth daily.   apixaban 5 MG Tabs tablet Commonly known as: Eliquis Take 1 tablet (5 mg total) by mouth 2 (two) times daily.     atorvastatin 20 MG tablet Commonly known as: LIPITOR TAKE 1 TABLET BY MOUTH ONCE DAILY AT  6PM   carvedilol 25 MG tablet Commonly known as: COREG Take 1 tablet (25 mg total) by mouth 2 (two) times daily.   denosumab 60 MG/ML Sosy injection Commonly known as: PROLIA Inject 60 mg into the skin every 6 (six) months.   furosemide 40 MG tablet Commonly known as: LASIX TAKE 1 TABLET BY MOUTH IN THE MORNING   gabapentin 600 MG tablet Commonly known as: NEURONTIN Take 1 tablet by mouth 4 times daily   Incruse Ellipta 62.5 MCG/INH Aepb Generic drug: umeclidinium bromide Inhale 1 puff into the lungs daily.   lidocaine-prilocaine cream Commonly known as: EMLA Apply 1 application topically as needed.   nitrofurantoin (macrocrystal-monohydrate) 100 MG capsule Commonly known as: MACROBID Take 100 mg by mouth at bedtime. UTI prophylaxis prescribed by urology   nitroGLYCERIN 0.4 MG SL tablet Commonly known as: NITROSTAT Place 1 tablet (0.4 mg total) under the tongue every 5 (five) minutes as needed. CHEST PAIN   oxyCODONE-acetaminophen 10-325 MG tablet Commonly known as: PERCOCET Take 1 tablet by mouth every 4 (four) hours as needed. PAIN   pantoprazole 40 MG tablet Commonly known as: PROTONIX Take 1 tablet by mouth twice daily   tiZANidine 4 MG tablet Commonly known as: ZANAFLEX Take 1 tablet (4 mg total) by mouth 3 (three) times daily as needed. for muscle spams  triamcinolone ointment 0.1 % Commonly known as: KENALOG APPLY TO SKIN TWICE DAILY FOR 14 DAYS   zolpidem 5 MG tablet Commonly known as: AMBIEN TAKE 1 TABLET BY MOUTH EVERY DAY AT BEDTIME AS NEEDED       Allergies:  Allergies  Allergen Reactions  . Dabigatran Etexilate Mesylate Other (See Comments)    INTERNAL BLEEDING-pradaxa  . Augmentin [Amoxicillin-Pot Clavulanate] Diarrhea and Nausea Only  . Talwin [Pentazocine] Other (See Comments)    hallucinations  . Lactate Rash    Past Medical History,  Surgical history, Social history, and Family History were reviewed and updated.  Review of Systems: All other 10 point review of systems is negative.   Physical Exam:  height is 5' (1.524 m) and weight is 90 lb (40.8 kg). Her temporal temperature is 97.6 F (36.4 C). Her blood pressure is 96/57 (abnormal) and her pulse is 63. Her respiration is 18 and oxygen saturation is 100%.   Wt Readings from Last 3 Encounters:  05/11/19 90 lb (40.8 kg)  04/09/19 91 lb (41.3 kg)  03/09/19 91 lb 1.3 oz (41.3 kg)    Ocular: Sclerae unicteric, pupils equal, round and reactive to light Ear-nose-throat: Oropharynx clear, dentition fair Lymphatic: No cervical or supraclavicular adenopathy Lungs no rales or rhonchi, good excursion bilaterally Heart regular rate and rhythm, no murmur appreciated Abd soft, nontender, positive bowel sounds, No liver or spleen tip palpated on exam, no fluid wave  MSK no focal spinal tenderness, no joint edema Neuro: non-focal, well-oriented, appropriate affect Breasts: Deferred   Lab Results  Component Value Date   WBC 7.8 05/11/2019   HGB 9.9 (L) 05/11/2019   HCT 32.0 (L) 05/11/2019   MCV 92.8 05/11/2019   PLT 246 05/11/2019   Lab Results  Component Value Date   FERRITIN 734 (H) 04/09/2019   IRON 98 04/09/2019   TIBC 165 (L) 04/09/2019   UIBC 67 (L) 04/09/2019   IRONPCTSAT 59 (H) 04/09/2019   Lab Results  Component Value Date   RETICCTPCT 0.7 05/11/2019   RBC 3.43 (L) 05/11/2019   RETICCTABS 64.6 04/01/2009   No results found for: KPAFRELGTCHN, LAMBDASER, KAPLAMBRATIO No results found for: IGGSERUM, IGA, IGMSERUM No results found for: Kathrynn Ducking, MSPIKE, SPEI   Chemistry      Component Value Date/Time   NA 140 05/11/2019 1040   K 4.5 05/11/2019 1040   CL 103 05/11/2019 1040   CO2 32 05/11/2019 1040   BUN 21 05/11/2019 1040   CREATININE 0.83 05/11/2019 1040   CREATININE 0.91 03/16/2016 1539        Component Value Date/Time   CALCIUM 8.4 (L) 05/11/2019 1040   ALKPHOS 131 (H) 05/11/2019 1040   AST 21 05/11/2019 1040   ALT 21 05/11/2019 1040   BILITOT 0.3 05/11/2019 1040       Impression and Plan: [name] is a very pleasant 74yo caucasian female with iron deficiency anemia secondary to malabsorption and possible GI blood loss with Eliquis. She received Retacrit today for Hgb 9.9.  Iron studies are pending and will be replaced if needed.  We will se her again in 3 weeks for follow-up.  She will contact our office with any questions or concerns. We can certainly see her sooner if needed.  Laverna Peace, NP 2/22/202111:31 AM

## 2019-05-12 LAB — IRON AND TIBC
Iron: 54 ug/dL (ref 41–142)
Saturation Ratios: 26 % (ref 21–57)
TIBC: 206 ug/dL — ABNORMAL LOW (ref 236–444)
UIBC: 152 ug/dL (ref 120–384)

## 2019-05-12 LAB — FERRITIN: Ferritin: 537 ng/mL — ABNORMAL HIGH (ref 11–307)

## 2019-05-14 ENCOUNTER — Other Ambulatory Visit: Payer: Self-pay

## 2019-05-14 DIAGNOSIS — I4891 Unspecified atrial fibrillation: Secondary | ICD-10-CM | POA: Diagnosis not present

## 2019-05-14 DIAGNOSIS — J449 Chronic obstructive pulmonary disease, unspecified: Secondary | ICD-10-CM | POA: Diagnosis not present

## 2019-05-14 NOTE — Patient Outreach (Signed)
[facility]) Care Management Chronic Special Needs Program  05/14/2019  Name: [name] DOB: Jan 08, 1946  MRN: [id number]  [name] is enrolled in a chronic special needs plan for Heart Failure. Chronic Care Management Coordinator telephoned client to review health risk assessment and to develop individualized care plan.  Introduced the chronic care management program, importance of client participation, and taking their care plan to all provider appointments and inpatient facilities.  Reviewed the transition of care process and possible referral to community care management.  Subjective: Client states she has been doing good.  States her daily weights has been steady and she weighted 90.4 lb this morning.  States she does not write down her weights but she remembers them as they vary so little.  States she breathing is good as long as she wears her oxygen.  States she is watching the salt in her diet.  States she tries to eat foods with more calories as she does not like nutritional supplements.  States she her B/P is usually low.  Denies any falls.  Denies any issues with affording her medications as she has pharmacy assistance from the drug companies.  Denies any issues with her atrial fibrillation and she is taking her Eliquis as ordered.  Goals Addressed            This Visit's Progress   . Client understands the importance of follow-up with providers by attending scheduled visits   On track    Plan to keep scheduled appointments with providers Reinforced to keep scheduled appointments with providers    . Client will report no worsening of symptoms of Atrial Fibrillation within the next 6 months(continued 05/14/19)   On track    Educated on signs, symptoms and monitoring for worsening atrial fibrillation Reviewed atrial fibrillation action plan Reviewed importance of taking anticoagulant to prevent stroke    . Client will report weighing and recording  weights daily within next 6 months(continue 05/14/19   On track    Reinforced importance of weighting daily and to record weights Reviewed heart failure zones and when to call MD    . Client will verbalize knowledge of chronic lung disease as evidenced by no ED visits or Inpatient stays related to chronic lung disease    On track    Reports no changes in COPD with no ED or hospital visits Reviewed COPD action plan in [employer](HT[facility]) calendar  Sent EMMI: COPD What you can do    . Client will verbalize knowledge of self management of Hypertension as evidences by BP reading of 140/90 or less; or as defined by provider   On track    Reports very good B/P control last reading of 96/57 at provider visit Plan to check B/P regularly Take B/P medications as ordered Plan to follow a low salt diet  Increase activity as tolerated EMMI: High Blood Pressure(Hypertension) What you can do    . Decrease inpatient Heart Failure admissions/ readmissions with in the year   On track    No Heart failure admission in 2020 Signs and symptoms of heart failure reviewed Education provided for self-management of Heart Disease. Advised to notify doctor for symptoms Call 911 for severe symptoms Weigh daily and record weighs Follow a low salt diet    . Maintain timely refills of Heart Failure medication as prescribed within the year    On track    Maintaining timely refills of medications per dispense report Reinforced importance of getting  medications refilled on time    . Obtain annual  Lipid Profile, LDL-C   On track    Last completed 05/11/19 LDL 54 Plan to take statin as ordered    . Patient Stated plan to walk 15 minutes 3 x a week for the next 6 months (pt-stated)       Plan to pace your activity Plan to take your cell phone and or family member with you    . Visit Primary Care Provider or Cardiologist at least 2 times per year   On track    Primary care provider 05/07/19 Please schedule your  annual wellness visit     Verbalized good teach back of HF and COPD zones and when to call provider. Reviewed number for 24-hour nurse Line  Plan:  Send successful outreach letter with a copy of their individualized care plan, Send individual care plan to provider and Send educational material-EMMI High Blood Pressure(Hypertension) What you can do, COPD  Chronic care management coordination will outreach in:  6 Months     Peter Garter RN, [facility], [place] Management Coordinator [place] [facility] [phone]

## 2019-05-14 NOTE — Patient Instructions (Signed)
[place]

## 2019-05-15 ENCOUNTER — Ambulatory Visit: Payer: HMO | Admitting: Pulmonary Disease

## 2019-05-15 ENCOUNTER — Other Ambulatory Visit: Payer: Self-pay

## 2019-05-15 ENCOUNTER — Ambulatory Visit (INDEPENDENT_AMBULATORY_CARE_PROVIDER_SITE_OTHER): Payer: HMO | Admitting: Pulmonary Disease

## 2019-05-15 ENCOUNTER — Encounter: Payer: Self-pay | Admitting: Pulmonary Disease

## 2019-05-15 DIAGNOSIS — J9611 Chronic respiratory failure with hypoxia: Secondary | ICD-10-CM

## 2019-05-15 DIAGNOSIS — J471 Bronchiectasis with (acute) exacerbation: Secondary | ICD-10-CM | POA: Diagnosis not present

## 2019-05-15 DIAGNOSIS — J439 Emphysema, unspecified: Secondary | ICD-10-CM | POA: Diagnosis not present

## 2019-05-15 NOTE — Assessment & Plan Note (Signed)
Plan: Continue oxygen therapy 

## 2019-05-15 NOTE — Patient Instructions (Addendum)
You were seen today by Lauraine Rinne, NP  for:   I am concerned with your progression of your CT as well as your cough.  You seem to only respond to Levaquin.  I would like to obtain sputum samples.  I will discuss this with Dr. Halford Chessman.  If you start having a productive cough please contact our office so we can obtain sputum samples of this.  I will discuss your case with Dr. Halford Chessman and then follow back up with you.  Please do not hesitate to contact our office if you have worsened symptoms or additional questions.  Please notify us if you start having fevers or persistent fatigue.  As always it is a pleasure talking with you,  Aaron Edelman  1. Bronchiectasis with acute exacerbation (HCC)  Continue flutter valve  Continue Mucinex  Continue hypertonic saline nebs twice daily  I will discuss your symptoms with Dr. Halford Chessman, I believe we may need to proceed forward with a bronchoscopy we will contact you once I have spoken with Dr. Halford Chessman  2. Chronic hypoxemic respiratory failure (HCC)  Continue oxygen therapy as prescribed  >>>maintain oxygen saturations greater than 88 percent  >>>if unable to maintain oxygen saturations please contact the office  >>>do not smoke with oxygen  >>>can use nasal saline gel or nasal saline rinses to moisturize nose if oxygen causes dryness  3. Pulmonary emphysema, unspecified emphysema type (HCC)  Incruse Ellipta  >>> Take 1 puff daily in the morning right when you wake up >>>Rinse your mouth out after use >>>This is a daily maintenance inhaler, NOT a rescue inhaler >>>Contact our office if you are having difficulties affording or obtaining this medication >>>It is important for you to be able to take this daily and not miss any doses    Note your daily symptoms > remember "red flags" for COPD:   >>>Increase in cough >>>increase in sputum production >>>increase in shortness of breath or activity  intolerance.   If you notice these symptoms, please call the  office to be seen.    Follow Up:    Return in about 4 weeks (around 06/12/2019), or if symptoms worsen or fail to improve, for Follow up with Dr. Halford Chessman, Follow up with Wyn Quaker FNP-C.   Please do your part to reduce the spread of COVID-19:      Reduce your risk of any infection  and COVID19 by using the similar precautions used for avoiding the common cold or flu:  [name] Wash your hands often with soap and warm water for at least 20 seconds.  If soap and water are not readily available, use an alcohol-based hand sanitizer with at least 60% alcohol.  . If coughing or sneezing, cover your mouth and nose by coughing or sneezing into the elbow areas of your shirt or coat, into a tissue or into your sleeve (not your hands). Langley Gauss A MASK when in public  . Avoid shaking hands with others and consider head nods or verbal greetings only. . Avoid touching your eyes, nose, or mouth with unwashed hands.  . Avoid close contact with people who are sick. . Avoid places or events with large numbers of people in one location, like concerts or sporting events. . If you have some symptoms but not all symptoms, continue to monitor at home and seek medical attention if your symptoms worsen. . If you are having a medical emergency, call 911.   [place] /  e-Visit: [email or web address]         [facility]: [phone]  [facility]: [phone]                   [facility]: [phone]     It is flu season:   >>> Best ways to protect herself from the flu: Receive the yearly flu vaccine, practice good hand hygiene washing with soap and also using hand sanitizer when available, eat a nutritious meals, get adequate rest, hydrate appropriately   Please contact the office if your symptoms worsen or you have concerns that you are not improving.   Thank you for  choosing [facility] for your healthcare, and for allowing [place] to partner with you on your healthcare journey. I am thankful to be able to provide care to you today.   Wyn Quaker FNP-C

## 2019-05-15 NOTE — Assessment & Plan Note (Signed)
Recent high-resolution CT chest shows progression, stigmata of Mycobacterium avium complex Patient feels like cough is starting to return Recently treated in January/2021 with Augmentin and then Levaquin Bronchiectasis severity index score 8  Discussion: We will discuss case with Dr. Halford Chessman.  I believe patient may be candidate for bronchoscopy so we can obtain adequate sputum samples and AFB given the progression on the CT.  Patient is agreeable to bronchoscopy.  She would prefer Dr. Michela Pitcher to do the bronchoscopy.  Plan: Continue flutter valve Continue Mucinex Continue hypertonic saline nebs We may need to consider bronchoscopy to obtain better sputum samples

## 2019-05-15 NOTE — Assessment & Plan Note (Signed)
Plan: Continue Incruse Continue rescue inhaler

## 2019-05-15 NOTE — Progress Notes (Signed)
Virtual Visit via Telephone Note  I connected with [name] on 05/15/19 at  4:30 PM EST by telephone and verified that I am speaking with the correct person using two identifiers.  Location: Patient: Home Provider: [facility] - [address], [place], [place], [place] [address]   I discussed the limitations, risks, security and privacy concerns of performing an evaluation and management service by telephone and the availability of in person appointments. I also discussed with the patient that there may be a patient responsible charge related to this service. The patient expressed understanding and agreed to proceed.  Patient consented to consult via telephone: Yes People present and their role in pt care: Pt    History of Present Illness:  74 year old female former smoker followed in our office for COPD, chronic hypoxemic respiratory failure, bronchiectasis  Past medical history: Hypocalcemia, left bundle branch block, A. fib flutter, hypertension, chronic pain syndrome, osteoarthritis, chronic fatigue, GERD, insomnia, small bowel obstruction, plaque psoriasis (previously used methotrexate had recurrent respiratory infections) Smoking history: Former smoker.  Quit 1988.  20-pack-year smoking history. Maintenance: Incruse Ellipta  Patient of Dr. Halford Chessman  Chief complaint: Follow up  -recent high-resolution CT chest   74 year old female former smoker phone office for COPD, bronchiectasis and chronic respiratory failure.  Patient reports he has been doing okay since last being seen telephonically in January/2021.  Patient recently completed a high resolution CT chest on 04/27/2019.  Patient reports that she is just now recently started to have a cough again.  She denies fevers.  She believes that her mucus is clear.  She is unable to cough it up.  She has been unable to produce a sputum sample since 2019.  High-resolution CT chest completed on 04/27/2019 showing stigmata of  microbacterium, progressed from January/2019.  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, diffuse cylindrical bronchiectasis, patchy tree-in-bud opacities and patchy nodule clear peribronchial vascular and peripheral foci of consolidation, follow-up high resolution posttreatment chest CT may be considered 6 to 12 months, stable chronic mediastinal and right hilar adenopathy most compatible with reactive adenopathy  04/27/2019 - CT High Res Chest -extensive peribronchial vascular nodularity and nodular consolidation with cylindrical bronchiectasis, progressive from 03/28/2017, no pleural fluid, Mycobacterium avium complex progressive from 03/28/2017, possible 7 mm splenic artery aneurysm, aortic arthrosclerosis  06/01/2017-echocardiogram-LV ejection fraction 55 to 74%, grade 1 diastolic dysfunction, PA pressure 33  10/10/2011-pulmonary function test-FVC 1.47 (54% predicted), postbronchodilator ratio 64, postbronchodilator FEV1 1.15 (59% predicted), positive bronchodilator response, DLCO 5.3 (35% predicted)   Social History   Tobacco Use  Smoking Status Former Smoker  . Packs/day: 1.00  . Years: 20.00  . Pack years: 20.00  . Quit date: 03/19/1986  . Years since quitting: 33.1  Smokeless Tobacco Never Used   Immunization History  Administered Date(s) Administered  . DTaP 11/03/1996, 01/04/1997, 05/03/1997, 10/19/1997, 08/05/2001  . Fluad Quad(high Dose 65+) 11/06/2018  . HPV Quadrivalent 09/05/2009, 09/13/2010, 09/26/2011  . Hepatitis A, Ped/Adol-2 Dose 09/21/2004, 03/29/2005  . Hepatitis B, ped/adol 04/30/1996, 11/03/1996,  10/19/1997  . HiB (PRP-T) 11/03/1996, 01/04/1997, 05/03/1997, 10/19/1997  . IPV 11/03/1996, 01/04/1997, 05/03/1997, 08/05/2001  . Influenza Split 01/16/2011, 12/12/2011  . Influenza Whole 01/15/2007, 12/08/2007, 12/28/2008, 12/17/2009  . Influenza, High Dose Seasonal PF 11/28/2015, 12/07/2016, 11/15/2017  .  Influenza, Seasonal, Injecte, Preservative Fre 12/09/2012  . Influenza,Quad,Nasal, Live 03/17/2007, 03/06/2009, 01/08/2010, 01/30/2011, 02/09/2012, 01/18/2013  . Influenza,inj,Quad PF,6+ Mos 03/29/2005, 04/30/2005, 03/01/2006, 01/28/2008, 12/01/2013, 01/16/2014, 12/22/2014, 12/28/2014  . MMR 05/03/1997, 08/05/2001  . Meningococcal B, OMV 12/22/2014  . Pneumococcal Conjugate-13 06/10/2014, 11/15/2014  . Pneumococcal Polysaccharide-23 12/17/2009  . Tdap 09/17/2007, 05/15/2014, 12/09/2018  . Varicella 11/16/1997, 05/18/2008  . Zoster 12/31/2013   Assessment and Plan:  Bronchiectasis ([place]) Recent high-resolution CT chest shows progression, stigmata of Mycobacterium avium complex Patient feels like cough is starting to return Recently treated in January/2021 with Augmentin and then Levaquin Bronchiectasis severity index score 8  Discussion: We will discuss case with Dr. Halford Chessman.  I believe patient may be candidate for bronchoscopy so we can obtain adequate sputum samples and AFB given the progression on the CT.  Patient is agreeable to bronchoscopy.  She would prefer Dr. Michela Pitcher to do the bronchoscopy.  Plan: Continue flutter valve Continue Mucinex Continue hypertonic saline nebs We may need to consider bronchoscopy to obtain better sputum samples  Chronic hypoxemic respiratory failure Plan: Continue oxygen therapy  COPD with emphysema (HCC) Plan: Continue Incruse Continue rescue inhaler   Follow Up Instructions:  Return in about 4 weeks (around 06/12/2019), or if symptoms worsen or fail to improve, for Follow up with Dr. Halford Chessman, Follow up with Wyn Quaker FNP-C.   I discussed the  assessment and treatment plan with the patient. The patient was provided an opportunity to ask questions and all were answered. The patient agreed with the plan and demonstrated an understanding of the instructions.   The patient was advised to call back or seek an in-person evaluation if the symptoms worsen or if the condition fails to improve as anticipated.  I provided 22 minutes of non-face-to-face time during this encounter.   Lauraine Rinne, NP

## 2019-05-17 DIAGNOSIS — I4891 Unspecified atrial fibrillation: Secondary | ICD-10-CM | POA: Diagnosis not present

## 2019-05-17 DIAGNOSIS — J449 Chronic obstructive pulmonary disease, unspecified: Secondary | ICD-10-CM | POA: Diagnosis not present

## 2019-05-18 ENCOUNTER — Telehealth: Payer: Self-pay | Admitting: Internal Medicine

## 2019-05-18 ENCOUNTER — Telehealth: Payer: Self-pay | Admitting: *Deleted

## 2019-05-18 ENCOUNTER — Encounter (HOSPITAL_COMMUNITY): Payer: Self-pay | Admitting: Pulmonary Disease

## 2019-05-18 NOTE — Telephone Encounter (Signed)
Spoke with patient. Let her know her procedure was scheduled for Monday May 25 2019 at 0900 at [facility].  Instructed patient to arrive at 0730, do not take the eliquis 48 hours prior to surgery, nothing to eat or drink night before, and can resume eliquis 24 hours after procedure. Also instructed patient she will need someone to drive her to the appointment as she will not be able to drive after.  Patient verbalized instructions back, no further questions.    Patient scheduled for covid swab on Friday May 22, 2019 at 12pm. Patient given the [address] address. Patient verbalized understanding.   Nothing further needed will route to Wyn Quaker and Dr. Halford Chessman as Juluis Rainier

## 2019-05-18 NOTE — Telephone Encounter (Signed)
Noted  

## 2019-05-18 NOTE — Telephone Encounter (Signed)
Error

## 2019-05-18 NOTE — Progress Notes (Signed)
Reviewed and agree with assessment/plan.  Will arrange for bronchoscopy with BAL.   Chesley Mires, MD [facility]/Critical [facility] 03/14/2016, 12:24 PM Pager:  [phone]

## 2019-05-18 NOTE — Telephone Encounter (Signed)
----- Message from Lauraine Rinne, NP sent at 05/18/2019  2:09 PM EST ----- Regarding: RE: Bronch? MAI? Cultures? Ill try to help answer for Sood to help him out:   Diagnosis: BronchiectasisProcedure: Bronchoscopy with BAL, respiratory sputum, AFB, fungal Priority: Routine Medication restriction: None that I am aware of Labs: Recent CBC and CMP from 05/11/2019, likely do not need PT/INR given the fact the patient is on DOAC, patient needs just hold as previously instructed-48 hours prior, can resume 24 hours afterImaging: Patient already has recent high-resolution CT chest in February/2021, I do not believe anything else is needed Aaron Edelman  ----- Message ----- From: Amado Coe, RN Sent: 05/18/2019   1:08 PM EST To: Chesley Mires, MD, Lauraine Rinne, NP, # Subject: RE: Bronch? MAI? Cultures?                     Please schedule the following:  1. Diagnosis:  2. Procedure:  Date: 3/8, or 3/9 CPT code- 60109 3. Priority:  4. Medication Restriction:  5. Pre-op Labs Ordered: CBC, CMP, PT/INR, PTT ??? 6. Imaging request:  (If, SuperDimension CT Chest, please have STAT courier sent to [name].)  Please coordinate Pre-op COVID Testing I will get once I know the actual day available.    I just need this information (all with numbers) and I can move forward with scheduling. ----- Message ----- From: Lauraine Rinne, NP Sent: 05/18/2019  12:42 PM EST To: Amado Coe, RN, Chesley Mires, MD, # Subject: RE: [name]? MAI? Cultures?                      [name] see below:   Pt needs bronch with Dr. Halford Chessman - can do 3/8 or 3/9  Pt aware of bronch, wants Dr. Halford Chessman to do.   Needs covid testing   Hold eliquis for 48 hours before and 24 hours after Orson Eva  ----- Message ----- From: Chesley Mires, MD Sent: 05/18/2019  11:57 AM EST To: Lauraine Rinne, NP Subject: RE: Bronch? MAI? Cultures?                     I can do either Monday March 8 or Tuesday March 9.   Thanks.  [name] ----- Message ----- From: Lauraine Rinne, NP Sent: 05/18/2019  11:52 AM EST To: Chesley Mires, MD Subject: RE: Bronch? MAI? Cultures?                     If you would like I do not mind helping with this.  Jaydan Chretien has been a good point person has been scheduling and coordinating these.  Most likely this would need to be completed at [facility] on either Monday or Tuesday.  Based off the last procedure she did not have seen.  The way that I understand these of the scheduled pulmonary blocks for scheduling procedures:[place] for standard bronchoscopy-Mondays there is a block from 7:30 AM to 10 AM, Tuesdays is a block from 12 PM to 3 PM.[place] for EBUS or navigational bronchus only in those blocks are on Tuesdays or Fridays.  Seems like the easiest next step would be are you available Monday or Tuesday next week for one of the [place] long slots?  If so then [name] can get the patient scheduled for Covid testing and get it all coordinated.Aaron Edelman ----- Message ----- From: Chesley Mires, MD Sent: 05/18/2019  11:50 AM EST To: Lauraine Rinne, NP Subject: RE: Bronch? MAI? Cultures?                     I can set her up for a bronchoscopy later this week, if I can figure out how to after all of Icard's meddling.    [name] ----- Message ----- From: Lauraine Rinne, NP Sent: 05/15/2019   1:35 PM EST To: Chesley Mires, MD Subject: Bronch? MAI? Cultures?                         VS,Completed a televisit with this patient who is scheduled to see you today.  Starting to have more of a productive cough.  Treated with 2 rounds of antibiotics in January/2021.  First Augmentin and then Levaquin.  Levaquin did help with treatment of symptoms.  Patient had a high resolution CT chest that was completed earlier this month that shows progression in the stigmata of MAI.  Patient is unable to bring up adequate sputum samples.  Last sputum culture in 2019 showed no growth and AFB was negative.I think patient  may unfortunately need a bronchoscopy.  This would obviously be helpful with a BAL, respiratory sputum as well as an AFB.  She is very thin -90 pounds so unsure if she would tolerate triple therapy if she does need it or require it.She is agreeable to a bronchoscopy if you agree.  She would absolutely prefer to have the bronchoscopy with you.  We can always tentatively treat her with Levaquin again unfortunately if we needed to schedule the bronchoscopy later on in March.  If you agree.Let me know your thoughts.Aaron Edelman

## 2019-05-18 NOTE — Telephone Encounter (Signed)
Thank you Lauren!Aaron Edelman

## 2019-05-19 DIAGNOSIS — I428 Other cardiomyopathies: Secondary | ICD-10-CM | POA: Insufficient documentation

## 2019-05-20 ENCOUNTER — Other Ambulatory Visit: Payer: Self-pay | Admitting: Family Medicine

## 2019-05-20 DIAGNOSIS — R52 Pain, unspecified: Secondary | ICD-10-CM

## 2019-05-21 ENCOUNTER — Other Ambulatory Visit: Payer: Self-pay

## 2019-05-21 ENCOUNTER — Telehealth (INDEPENDENT_AMBULATORY_CARE_PROVIDER_SITE_OTHER): Payer: HMO | Admitting: Internal Medicine

## 2019-05-21 VITALS — Ht 60.0 in | Wt 90.0 lb

## 2019-05-21 DIAGNOSIS — Z950 Presence of cardiac pacemaker: Secondary | ICD-10-CM

## 2019-05-21 DIAGNOSIS — I480 Paroxysmal atrial fibrillation: Secondary | ICD-10-CM

## 2019-05-21 DIAGNOSIS — I499 Cardiac arrhythmia, unspecified: Secondary | ICD-10-CM | POA: Diagnosis not present

## 2019-05-21 DIAGNOSIS — I428 Other cardiomyopathies: Secondary | ICD-10-CM | POA: Diagnosis not present

## 2019-05-21 DIAGNOSIS — I5030 Unspecified diastolic (congestive) heart failure: Secondary | ICD-10-CM | POA: Diagnosis not present

## 2019-05-21 DIAGNOSIS — D649 Anemia, unspecified: Secondary | ICD-10-CM | POA: Diagnosis not present

## 2019-05-21 NOTE — Progress Notes (Signed)
Electrophysiology TeleHealth Note   Due to national recommendations of social distancing due to COVID 19, an audio/video telehealth visit is felt to be most appropriate for this patient at this time.  See MyChart message from today for the patient's consent to telehealth for [facility].   Date:  05/21/2019   ID:  [name], DOB 1945-11-02, MRN [id number]  Location: patient's home  Provider location: [address], [place]  Evaluation Performed: Follow-up visit  PCP:  Ann Held, DO  Cardiologist:     Electrophysiologist:  SK   Chief Complaint:  cardiomyopathy and dyspnea  History of Present Illness:    [name] is a 74 y.o. female who presents via audio/video conferencing for a telehealth visit today.  Since last being seen in our clinic for cardiomyopathy and s/p CRTP, the patient reports doing reasonably well Although recent pneumonia for which bronch is scheduled Monday and increase in O2 from 2>>3L  Admitted 3/19 to hosp with TIA and anticoagulation started for "history of Afib"   Dx many years ago-- per [name] per her report.  I see she was on amio as far back as 2011     Chronic but stable dyspnea- some edema    Date Cr K Hgb  *2/21 0.83 4.5 9.9     DATE TEST EF   11/09 Myoview 30%   3/19 Echo  55-65% LAE ild          The patient denies symptoms of fevers, chills, cough, or new SOB worrisome for COVID 19.     Past Medical History:  Diagnosis Date  . Anemia   . Arthritis    right hip  . Atrial fibrillation ([place])   . CAD (coronary artery disease)    mild disease per cath in 2011  . CHF (congestive heart failure) ([place])   . COPD (chronic obstructive pulmonary disease) (HCC)    continuous O2- 2l  . Diverticulitis   . Elbow fracture, left aug 2012  . Erythropoietin deficiency anemia 06/19/2018  . Fibromyalgia    COUPLE OF TIMES A YEAR-PT HAS EPISODES OF CONFUSION-USUALLY INVOLVES DAY/NIGHT REVERSAL AND  EPISODES OF TWITCHING AND FALLS--SEES DR. Jacelyn Grip - NEUROLOGIST-LAST SEEN 07/10/11  . Gastroparesis   . GERD (gastroesophageal reflux disease)   . H/O: GI bleed    from Pradaxa  . Hepatitis    hx of in high school   . HTN (hypertension)    under control; has been on med. x "years"  . Hx MRSA infection   . Hx of gastric ulcer   . Hx of stress fx aug 2011   right hip   . Iron malabsorption 08/14/2017  . LBBB (left bundle branch block)   . Oxygen dependent    2 liters via nasal cannula at all times  . Pacemaker    CRT therapy; followed by Dr. Caryl Comes  . Pleural effusion     s/p right thoracentesis 03/09  . Pneumonia - 03/09, 12/09, 02/10, 06/10   MOST RECENT FEB 2013  . Primary dilated cardiomyopathy (HCC)    EF 45 to 50% per echo in Jan 2012  . RLS (restless legs syndrome)   . Silent aspiration   . Small bowel obstruction ([place])   . Urinary incontinence     -INTERSTIM IMPLANT NOT FUNCTIONING PER PT  . Venous embolism and thrombosis of subclavian vein ([place])    after pacemaker insertion Oct 2011  . Vitamin B12 deficiency   .  Wears glasses    reading    Past Surgical History:  Procedure Laterality Date  . APPENDECTOMY    . CARDIAC CATHETERIZATION  04/08/2006, 11/16/2009  . CHOLECYSTECTOMY N/A 06/27/2017   Procedure: OPEN CHOLECYSTECTOMY;  Surgeon: Ralene Ok, MD;  Location: [facility];  Service: General;  Laterality: N/A;  . COLECTOMY     Intestinal resection (5times)  . COLECTOMY  02/04/2000   ex. lap., intra-abd. subtotal colectomy with ileosigmoid colon anastomosies and lysis of adhesions  . CYSTOSCOPY  11/11/2008  . CYSTOSCOPY W/ RETROGRADES  11/11/2008   right  . CYSTOSCOPY WITH INJECTION  04/30/2006   transurethral collagen injection; incision vaginal stenosis  . ERCP N/A 06/23/2017   Procedure: ENDOSCOPIC RETROGRADE CHOLANGIOPANCREATOGRAPHY (ERCP);  Surgeon: Carol Ada, MD;  Location: [facility];  Service: Endoscopy;  Laterality: N/A;  . ERCP N/A 06/24/2017   Procedure:  ENDOSCOPIC RETROGRADE CHOLANGIOPANCREATOGRAPHY (ERCP);  Surgeon: [name], MD;  Location: [facility] ENDOSCOPY;  Service: Endoscopy;  Laterality: N/A;  . ESOPHAGOGASTRODUODENOSCOPY N/A 02/02/2013   Procedure: ESOPHAGOGASTRODUODENOSCOPY (EGD);  Surgeon: Lafayette Dragon, MD;  Location: [facility];  Service: Endoscopy;  Laterality: N/A;  . EXPLORATORY LAPAROTOMY  04/27/2009   lysis of adhesions, gastrostomy tube  . GASTROCUTANEOUS FISTULA CLOSURE    . INTERSTIM IMPLANT PLACEMENT  05/28/2006 - stage I   06/05/2006 - stage II  . INTERSTIM IMPLANT PLACEMENT  02/05/2012   Procedure: [name] IMPLANT FIRST STAGE;  Surgeon: Reece Packer, MD;  Location: [facility];  Service: Urology;  Laterality: N/A;  Replacement of Interstim Lead     . INTERSTIM IMPLANT REVISION  03/06/2011   Procedure: REVISION OF [name];  Surgeon: Reece Packer, MD;  Location: [facility];  Service: Urology;  Laterality: N/A;  Replacement of Neurostimulator  . INTERSTIM IMPLANT REVISION  10/23/2007  . IR IMAGING GUIDED PORT INSERTION  10/14/2017  . PACEMAKER PLACEMENT  12/21/2009  . Peg removed     with complications  . PORT-A-CATH REMOVAL  07/27/2011   Procedure: REMOVAL PORT-A-CATH;  Surgeon: Rolm Bookbinder, MD;  Location: [facility];  Service: General;  Laterality: N/A;  . PORTACATH PLACEMENT  12/16/2009  . SAVORY DILATION N/A 02/02/2013   Procedure: SAVORY DILATION;  Surgeon: Lafayette Dragon, MD;  Location: [facility];  Service: Endoscopy;  Laterality: N/A;  . SMALL INTESTINE SURGERY  05/20/2001   ex. lap., resection of small bowel stricture; gastrostomy; insertion central line  . TONSILLECTOMY  age 16  . TOTAL ABDOMINAL HYSTERECTOMY     complete  . TOTAL HIP ARTHROPLASTY  08/03/2011   Procedure: TOTAL HIP ARTHROPLASTY ANTERIOR APPROACH;  Surgeon: Mcarthur Rossetti, MD;  Location: [facility];  Service: Orthopedics;  Laterality: Right;    Current Outpatient Medications  Medication Sig Dispense Refill  .  acetaminophen (TYLENOL) 650 MG CR tablet Take 650 mg by mouth every 8 (eight) hours as needed for pain.    [name] albuterol (VENTOLIN HFA) 108 (90 Base) MCG/ACT inhaler Inhale 2 puffs into the lungs every 6 (six) hours as needed for wheezing or shortness of breath. 6.7 g 12  . amLODipine (NORVASC) 2.5 MG tablet Take 1 tablet (2.5 mg total) by mouth daily. 90 tablet 3  . apixaban (ELIQUIS) 5 MG TABS tablet Take 1 tablet (5 mg total) by mouth 2 (two) times daily. 180 tablet 0  . atorvastatin (LIPITOR) 20 MG tablet TAKE 1 TABLET BY MOUTH ONCE DAILY AT  6PM (Patient taking differently: Take 20 mg by mouth daily at 6 PM. ) 90  tablet 1  . carvedilol (COREG) 25 MG tablet Take 1 tablet (25 mg total) by mouth 2 (two) times daily. 180 tablet 0  . denosumab (PROLIA) 60 MG/ML SOSY injection Inject 60 mg into the skin every 6 (six) months.    . furosemide (LASIX) 40 MG tablet TAKE 1 TABLET BY MOUTH IN THE MORNING (Patient taking differently: Take 40 mg by mouth daily. ) 90 tablet 2  . gabapentin (NEURONTIN) 600 MG tablet Take 1 tablet by mouth 4 times daily 120 tablet 1  . lidocaine-prilocaine (EMLA) cream Apply 1 application topically as needed. (Patient taking differently: Apply 1 application topically as needed [facility]). ) 30 g 6  . nitroGLYCERIN (NITROSTAT) 0.4 MG SL tablet Place 1 tablet (0.4 mg total) under the tongue every 5 (five) minutes as needed. CHEST PAIN 25 tablet 5  . oxyCODONE-acetaminophen (PERCOCET) 10-325 MG tablet Take 1 tablet by mouth every 4 (four) hours as needed. PAIN (Patient taking differently: Take 1 tablet by mouth every 4 (four) hours as needed for pain. PAIN) 120 tablet 0  . OXYGEN Inhale 3 L into the lungs continuous.    . pantoprazole (PROTONIX) 40 MG tablet Take 1 tablet by mouth twice daily (Patient taking differently: Take 40 mg by mouth 2 (two) times daily. ) 180 tablet 0  . promethazine (PHENERGAN) 25 MG tablet Take 25 mg by mouth every 6 (six) hours as needed for nausea or vomiting.     [name] tiZANidine (ZANAFLEX) 4 MG tablet Take 1 tablet (4 mg total) by mouth 3 (three) times daily as needed. for muscle spams (Patient taking differently: Take 4 mg by mouth in the morning and at bedtime. for muscle spams) 90 tablet 1  . triamcinolone ointment (KENALOG) 0.1 % Apply 1 application topically daily as needed (psoriasis).     [name] umeclidinium bromide (INCRUSE ELLIPTA) 62.5 MCG/INH AEPB Inhale 1 puff into the lungs daily. 1 each 12  . zolpidem (AMBIEN) 5 MG tablet TAKE 1 TABLET BY MOUTH EVERY DAY AT BEDTIME AS NEEDED (Patient taking differently: Take 5 mg by mouth at bedtime. ) 30 tablet 0  . nitrofurantoin, macrocrystal-monohydrate, (MACROBID) 100 MG capsule Take 100 mg by mouth daily. UTI prophylaxis prescribed by urology      No current facility-administered medications for this visit.    Allergies:   Dabigatran etexilate mesylate, Augmentin [amoxicillin-pot clavulanate], Talwin [pentazocine], and Lactate   Social History:  The patient  reports that she quit smoking about 33 years ago. She has a 20.00 pack-year smoking history. She has never used smokeless tobacco. She reports that she does not drink alcohol or use drugs.   Family History:  The patient's   family history includes Breast cancer in her maternal aunt; Diabetes in her paternal grandfather; Heart disease in her brother, father, mother, and paternal aunt; Stroke in her mother.   ROS:  Please see the history of present illness.   All other systems are personally reviewed and negative.    Exam:    Vital Signs:  Ht 5' (1.524 m)   Wt 90 lb (40.8 kg)   BMI 17.58 kg/m     Well appearing, alert and conversant, regular work of breathing,  good skin color Eyes- anicteric, neuro- grossly intact, skin- no apparent rash or lesions or cyanosis, mouth- oral mucosa is pink   Labs/Other Tests and Data Reviewed:    Recent Labs: 05/11/2019: ALT 21; BUN 21; Creatinine 0.83; Hemoglobin 9.9; Platelet Count 246; Potassium 4.5; Sodium  140  Wt Readings from Last 3 Encounters:  05/21/19 90 lb (40.8 kg)  05/18/19 91 lb (41.3 kg)  05/11/19 90 lb (40.8 kg)     Other studies personally reviewed:    Last device remote is reviewed from [facility] PDF dated 1/21 which reveals normal device function approaching ERI   arrhythmias - none    ASSESSMENT & PLAN:   Nonischemic cardiomyopathy interval resolution  Congestive heart failure diastolic     Atrial arrhythmia  Little by device interrogation  Pacemaker-CRT-[employer]  .   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  She previoulsy did not tolerate dabigitran but has been tolerating apixoban -- anemia appears to be hypoplastic and she may need heme referral  Device is approaching ERI and we reviewed the implications and the procedure of gen change including potential risks of infection   Euvolemic continue current meds    COVID 19 screen The patient denies symptoms of COVID 19 at this time.  The importance of social distancing was discussed today.  Follow-up:  51m Next remote:As Scheduled   Current medicines are reviewed at length with the patient today.   The patient does not have concerns regarding her medicines.  The following changes were made today:  none  Labs/ tests ordered today include:   No orders of the defined types were placed in this encounter.   Future tests ( post COVID )    Patient Risk:  after full review of this patients clinical status, I feel that they are at moderate risk at this time.  Today, I have spent 11* minutes with the patient with telehealth technology discussing the above.  Signed, Virl Axe, MD  05/21/2019 2:43 PM     [facility] [address] [place] [address]  [phone] (office) [phone] (fax)

## 2019-05-21 NOTE — H&P (View-Only) (Signed)
Electrophysiology TeleHealth Note   Due to national recommendations of social distancing due to COVID 19, an audio/video telehealth visit is felt to be most appropriate for this patient at this time.  See MyChart message from today for the patient's consent to telehealth for [facility].   Date:  05/21/2019   ID:  [name], DOB 01/17/1946, MRN [id number]  Location: patient's home  Provider location: [address], [place]  Evaluation Performed: Follow-up visit  PCP:  Ann Held, DO  Cardiologist:     Electrophysiologist:  SK   Chief Complaint:  cardiomyopathy and dyspnea  History of Present Illness:    [name] is a 74 y.o. female who presents via audio/video conferencing for a telehealth visit today.  Since last being seen in our clinic for cardiomyopathy and s/p CRTP, the patient reports doing reasonably well Although recent pneumonia for which bronch is scheduled Monday and increase in O2 from 2>>3L  Admitted 3/19 to hosp with TIA and anticoagulation started for "history of Afib"   Dx many years ago-- per [name] per her report.  I see she was on amio as far back as 2011     Chronic but stable dyspnea- some edema    Date Cr K Hgb  *2/21 0.83 4.5 9.9     DATE TEST EF   11/09 Myoview 30%   3/19 Echo  55-65% LAE ild          The patient denies symptoms of fevers, chills, cough, or new SOB worrisome for COVID 19.     Past Medical History:  Diagnosis Date  . Anemia   . Arthritis    right hip  . Atrial fibrillation ([place])   . CAD (coronary artery disease)    mild disease per cath in 2011  . CHF (congestive heart failure) ([place])   . COPD (chronic obstructive pulmonary disease) (H[facility])    continuous O2- 2l  . Diverticulitis   . Elbow fracture, left aug 2012  . Erythropoietin deficiency anemia 06/19/2018  . Fibromyalgia    COUPLE OF TIMES A YEAR-PT HAS EPISODES OF CONFUSION-USUALLY INVOLVES DAY/NIGHT REVERSAL AND  EPISODES OF TWITCHING AND FALLS--SEES DR. Jacelyn Grip - NEUROLOGIST-LAST SEEN 07/10/11  . Gastroparesis   . GERD (gastroesophageal reflux disease)   . H/O: GI bleed    from Pradaxa  . Hepatitis    hx of in high school   . HTN (hypertension)    under control; has been on med. x "years"  . Hx MRSA infection   . Hx of gastric ulcer   . Hx of stress fx aug 2011   right hip   . Iron malabsorption 08/14/2017  . LBBB (left bundle branch block)   . Oxygen dependent    2 liters via nasal cannula at all times  . Pacemaker    CRT therapy; followed by Dr. Caryl Comes  . Pleural effusion     s/p right thoracentesis 03/09  . Pneumonia - 03/09, 12/09, 02/10, 06/10   MOST RECENT FEB 2013  . Primary dilated cardiomyopathy (HCC)    EF 45 to 50% per echo in Jan 2012  . RLS (restless legs syndrome)   . Silent aspiration   . Small bowel obstruction ([place])   . Urinary incontinence     -INTERSTIM IMPLANT NOT FUNCTIONING PER PT  . Venous embolism and thrombosis of subclavian vein ([place])    after pacemaker insertion Oct 2011  . Vitamin B12 deficiency   .  Wears glasses    reading    Past Surgical History:  Procedure Laterality Date  . APPENDECTOMY    . CARDIAC CATHETERIZATION  04/08/2006, 11/16/2009  . CHOLECYSTECTOMY N/A 06/27/2017   Procedure: OPEN CHOLECYSTECTOMY;  Surgeon: Ralene Ok, MD;  Location: [place];  Service: General;  Laterality: N/A;  . COLECTOMY     Intestinal resection (5times)  . COLECTOMY  02/04/2000   ex. lap., intra-abd. subtotal colectomy with ileosigmoid colon anastomosies and lysis of adhesions  . CYSTOSCOPY  11/11/2008  . CYSTOSCOPY W/ RETROGRADES  11/11/2008   right  . CYSTOSCOPY WITH INJECTION  04/30/2006   transurethral collagen injection; incision vaginal stenosis  . ERCP N/A 06/23/2017   Procedure: ENDOSCOPIC RETROGRADE CHOLANGIOPANCREATOGRAPHY (ERCP);  Surgeon: Carol Ada, MD;  Location: [place];  Service: Endoscopy;  Laterality: N/A;  . ERCP N/A 06/24/2017   Procedure:  ENDOSCOPIC RETROGRADE CHOLANGIOPANCREATOGRAPHY (ERCP);  Surgeon: [name], MD;  Location[facility];  Service: Endoscopy;  Laterality: N/A;  . ESOPHAGOGASTRODUODENOSCOPY N/A 02/02/2013   Procedure: ESOPHAGOGASTRODUODENOSCOPY (EGD);  Surgeon: Lafayette Dragon, MD;  Location: [facility];  Service: Endoscopy;  Laterality: N/A;  . EXPLORATORY LAPAROTOMY  04/27/2009   lysis of adhesions, gastrostomy tube  . GASTROCUTANEOUS FISTULA CLOSURE    . INTERSTIM IMPLANT PLACEMENT  05/28/2006 - stage I   06/05/2006 - stage II  . INTERSTIM IMPLANT PLACEMENT  02/05/2012   Procedure: [name] IMPLANT FIRST STAGE;  Surgeon: Reece Packer, MD;  Location: [facility];  Service: Urology;  Laterality: N/A;  Replacement of Interstim Lead     . INTERSTIM IMPLANT REVISION  03/06/2011   Procedure: REVISION OF [name];  Surgeon: Reece Packer, MD;  Location: [facility];  Service: Urology;  Laterality: N/A;  Replacement of Neurostimulator  . INTERSTIM IMPLANT REVISION  10/23/2007  . IR IMAGING GUIDED PORT INSERTION  10/14/2017  . PACEMAKER PLACEMENT  12/21/2009  . Peg removed     with complications  . PORT-A-CATH REMOVAL  07/27/2011   Procedure: REMOVAL PORT-A-CATH;  Surgeon: Rolm Bookbinder, MD;  Location: [place];  Service: General;  Laterality: N/A;  . PORTACATH PLACEMENT  12/16/2009  . SAVORY DILATION N/A 02/02/2013   Procedure: SAVORY DILATION;  Surgeon: Lafayette Dragon, MD;  Location: [facility];  Service: Endoscopy;  Laterality: N/A;  . SMALL INTESTINE SURGERY  05/20/2001   ex. lap., resection of small bowel stricture; gastrostomy; insertion central line  . TONSILLECTOMY  age 23  . TOTAL ABDOMINAL HYSTERECTOMY     complete  . TOTAL HIP ARTHROPLASTY  08/03/2011   Procedure: TOTAL HIP ARTHROPLASTY ANTERIOR APPROACH;  Surgeon: Mcarthur Rossetti, MD;  Location: [facility];  Service: Orthopedics;  Laterality: Right;    Current Outpatient Medications  Medication Sig Dispense Refill  .  acetaminophen (TYLENOL) 650 MG CR tablet Take 650 mg by mouth every 8 (eight) hours as needed for pain.    [name] albuterol (VENTOLIN HFA) 108 (90 Base) MCG/ACT inhaler Inhale 2 puffs into the lungs every 6 (six) hours as needed for wheezing or shortness of breath. 6.7 g 12  . amLODipine (NORVASC) 2.5 MG tablet Take 1 tablet (2.5 mg total) by mouth daily. 90 tablet 3  . apixaban (ELIQUIS) 5 MG TABS tablet Take 1 tablet (5 mg total) by mouth 2 (two) times daily. 180 tablet 0  . atorvastatin (LIPITOR) 20 MG tablet TAKE 1 TABLET BY MOUTH ONCE DAILY AT  6PM (Patient taking differently: Take 20 mg by mouth daily at 6 PM. ) 90  tablet 1  . carvedilol (COREG) 25 MG tablet Take 1 tablet (25 mg total) by mouth 2 (two) times daily. 180 tablet 0  . denosumab (PROLIA) 60 MG/ML SOSY injection Inject 60 mg into the skin every 6 (six) months.    . furosemide (LASIX) 40 MG tablet TAKE 1 TABLET BY MOUTH IN THE MORNING (Patient taking differently: Take 40 mg by mouth daily. ) 90 tablet 2  . gabapentin (NEURONTIN) 600 MG tablet Take 1 tablet by mouth 4 times daily 120 tablet 1  . lidocaine-prilocaine (EMLA) cream Apply 1 application topically as needed. (Patient taking differently: Apply 1 application topically as needed [facility]). ) 30 g 6  . nitroGLYCERIN (NITROSTAT) 0.4 MG SL tablet Place 1 tablet (0.4 mg total) under the tongue every 5 (five) minutes as needed. CHEST PAIN 25 tablet 5  . oxyCODONE-acetaminophen (PERCOCET) 10-325 MG tablet Take 1 tablet by mouth every 4 (four) hours as needed. PAIN (Patient taking differently: Take 1 tablet by mouth every 4 (four) hours as needed for pain. PAIN) 120 tablet 0  . OXYGEN Inhale 3 L into the lungs continuous.    . pantoprazole (PROTONIX) 40 MG tablet Take 1 tablet by mouth twice daily (Patient taking differently: Take 40 mg by mouth 2 (two) times daily. ) 180 tablet 0  . promethazine (PHENERGAN) 25 MG tablet Take 25 mg by mouth every 6 (six) hours as needed for nausea or vomiting.     [name] tiZANidine (ZANAFLEX) 4 MG tablet Take 1 tablet (4 mg total) by mouth 3 (three) times daily as needed. for muscle spams (Patient taking differently: Take 4 mg by mouth in the morning and at bedtime. for muscle spams) 90 tablet 1  . triamcinolone ointment (KENALOG) 0.1 % Apply 1 application topically daily as needed (psoriasis).     [name] umeclidinium bromide (INCRUSE ELLIPTA) 62.5 MCG/INH AEPB Inhale 1 puff into the lungs daily. 1 each 12  . zolpidem (AMBIEN) 5 MG tablet TAKE 1 TABLET BY MOUTH EVERY DAY AT BEDTIME AS NEEDED (Patient taking differently: Take 5 mg by mouth at bedtime. ) 30 tablet 0  . nitrofurantoin, macrocrystal-monohydrate, (MACROBID) 100 MG capsule Take 100 mg by mouth daily. UTI prophylaxis prescribed by urology      No current facility-administered medications for this visit.    Allergies:   Dabigatran etexilate mesylate, Augmentin [amoxicillin-pot clavulanate], Talwin [pentazocine], and Lactate   Social History:  The patient  reports that she quit smoking about 33 years ago. She has a 20.00 pack-year smoking history. She has never used smokeless tobacco. She reports that she does not drink alcohol or use drugs.   Family History:  The patient's   family history includes Breast cancer in her maternal aunt; Diabetes in her paternal grandfather; Heart disease in her brother, father, mother, and paternal aunt; Stroke in her mother.   ROS:  Please see the history of present illness.   All other systems are personally reviewed and negative.    Exam:    Vital Signs:  Ht 5' (1.524 m)   Wt 90 lb (40.8 kg)   BMI 17.58 kg/m     Well appearing, alert and conversant, regular work of breathing,  good skin color Eyes- anicteric, neuro- grossly intact, skin- no apparent rash or lesions or cyanosis, mouth- oral mucosa is pink   Labs/Other Tests and Data Reviewed:    Recent Labs: 05/11/2019: ALT 21; BUN 21; Creatinine 0.83; Hemoglobin 9.9; Platelet Count 246; Potassium 4.5; Sodium  140  Wt Readings from Last 3 Encounters:  05/21/19 90 lb (40.8 kg)  05/18/19 91 lb (41.3 kg)  05/11/19 90 lb (40.8 kg)     Other studies personally reviewed:    Last device remote is reviewed from [place] PDF dated 1/21 which reveals normal device function approaching ERI   arrhythmias - none    ASSESSMENT & PLAN:   Nonischemic cardiomyopathy interval resolution  Congestive heart failure diastolic     Atrial arrhythmia  Little by device interrogation  Pacemaker-CRT-[employer]  .   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  She previoulsy did not tolerate dabigitran but has been tolerating apixoban -- anemia appears to be hypoplastic and she may need heme referral  Device is approaching ERI and we reviewed the implications and the procedure of gen change including potential risks of infection   Euvolemic continue current meds    COVID 19 screen The patient denies symptoms of COVID 19 at this time.  The importance of social distancing was discussed today.  Follow-up:  66m Next remote:As Scheduled   Current medicines are reviewed at length with the patient today.   The patient does not have concerns regarding her medicines.  The following changes were made today:  none  Labs/ tests ordered today include:   No orders of the defined types were placed in this encounter.   Future tests ( post COVID )    Patient Risk:  after full review of this patients clinical status, I feel that they are at moderate risk at this time.  Today, I have spent 11* minutes with the patient with telehealth technology discussing the above.  Signed, Virl Axe, MD  05/21/2019 2:43 PM     [facility] [address] [place]  [address] [phone] (office) [phone] (fax)

## 2019-05-21 NOTE — Patient Instructions (Signed)
Medication Instructions:  Your physician recommends that you continue on your current medications as directed. Please refer to the Current Medication list given to you today.  Labwork: None ordered.  Testing/Procedures: None ordered.  Follow-Up: Your physician wants you to follow-up in: 6 months. You will receive a reminder letter in the mail two months in advance. If you don't receive a letter, please call our office to schedule the follow-up appointment.  Remote monitoring is used to monitor your Pacemaker of ICD from home. This monitoring reduces the number of office visits required to check your device to one time per year. It allows [place] to keep an eye on the functioning of your device to ensure it is working properly. You are scheduled for a device check from home on 05/29/19. You may send your transmission at any time that day. If you have a wireless device, the transmission will be sent automatically. After your physician reviews your transmission, you will receive a postcard with your next transmission date.  Any Other Special Instructions Will Be Listed Below (If Applicable).  None   If you need a refill on your cardiac medications before your next appointment, please call your pharmacy.

## 2019-05-22 ENCOUNTER — Ambulatory Visit: Payer: Self-pay

## 2019-05-22 ENCOUNTER — Other Ambulatory Visit: Payer: Self-pay

## 2019-05-22 ENCOUNTER — Encounter: Payer: Self-pay | Admitting: Family Medicine

## 2019-05-22 ENCOUNTER — Ambulatory Visit: Payer: HMO | Admitting: Family Medicine

## 2019-05-22 ENCOUNTER — Other Ambulatory Visit (HOSPITAL_COMMUNITY)
Admission: RE | Admit: 2019-05-22 | Discharge: 2019-05-22 | Disposition: A | Discharge status: Home / Self Care | Payer: HMO | Source: Ambulatory Visit | Attending: Pulmonary Disease | Admitting: Pulmonary Disease

## 2019-05-22 DIAGNOSIS — Z20822 Contact with and (suspected) exposure to covid-19: Secondary | ICD-10-CM | POA: Diagnosis not present

## 2019-05-22 DIAGNOSIS — Z01812 Encounter for preprocedural laboratory examination: Secondary | ICD-10-CM | POA: Insufficient documentation

## 2019-05-22 DIAGNOSIS — M797 Fibromyalgia: Secondary | ICD-10-CM

## 2019-05-22 DIAGNOSIS — M25551 Pain in right hip: Secondary | ICD-10-CM

## 2019-05-22 LAB — SARS CORONAVIRUS 2 (TAT 6-24 HRS): SARS Coronavirus 2: NEGATIVE

## 2019-05-22 IMAGING — US US ABDOMEN LIMITED
1 series · 13 of 25 positions shown · non-contrast
Comparison: None.

CLINICAL DATA: Elevated LFTs

EXAM:
ULTRASOUND ABDOMEN LIMITED RIGHT UPPER QUADRANT

[Series 1: us abdomen limited · 0.17mm/px · 13 of 68 slices shown]
[im 1/68]
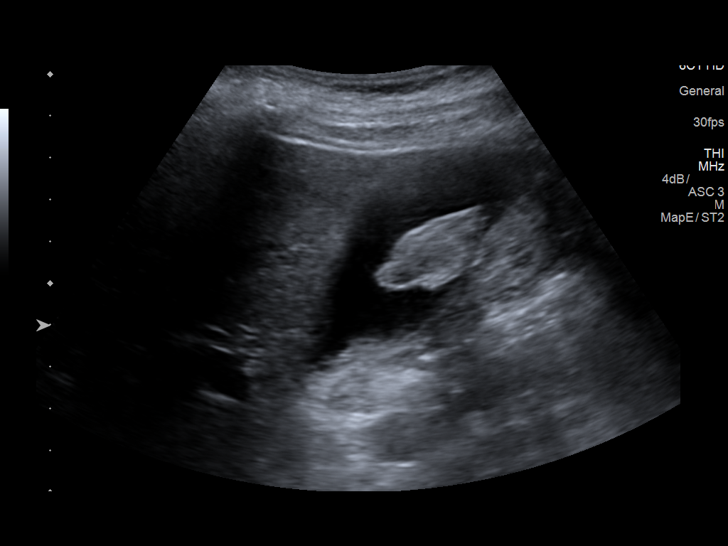
[im 6/68]
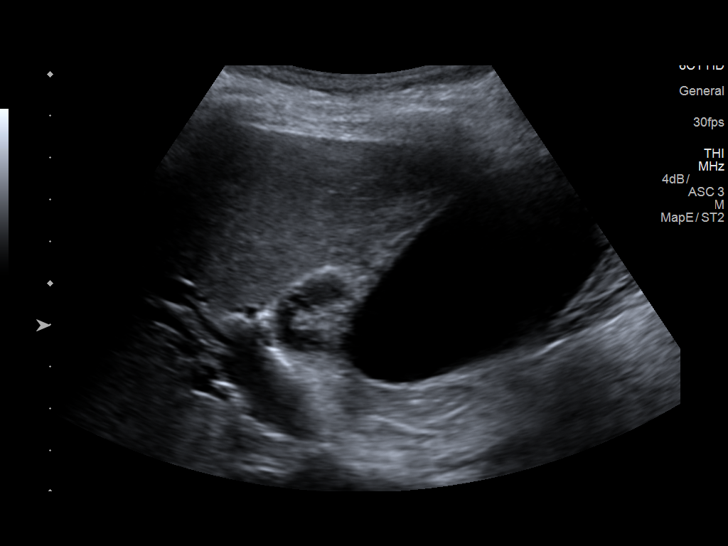
[im 12/68]
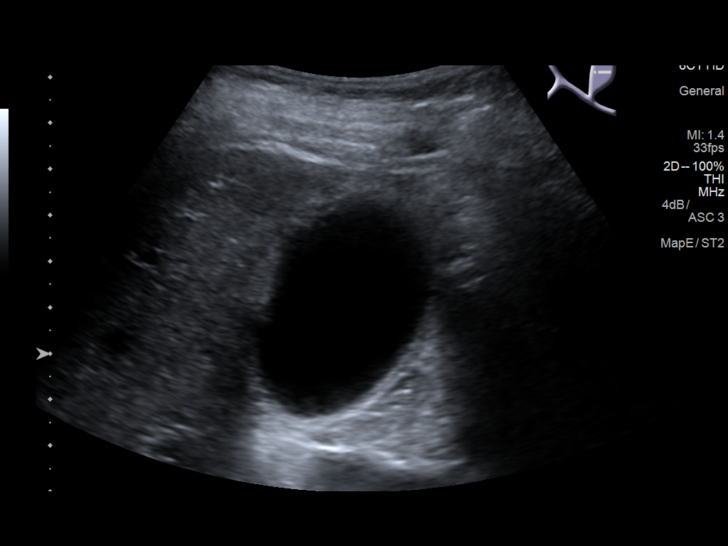
[im 17/68]
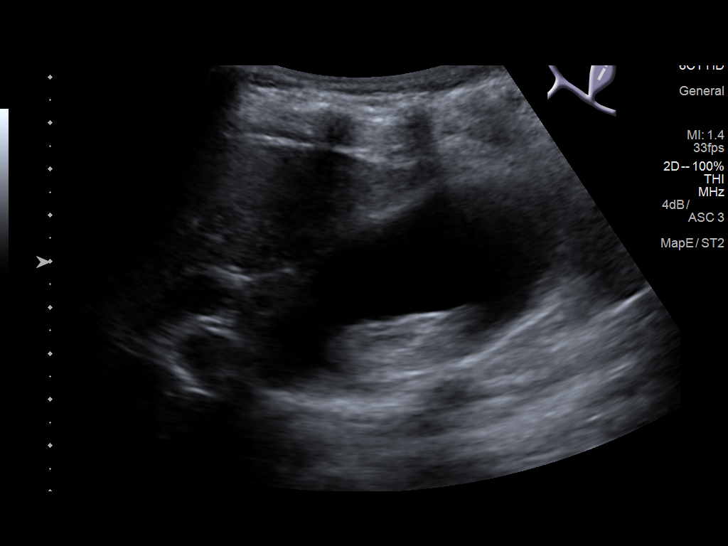
[im 23/68]
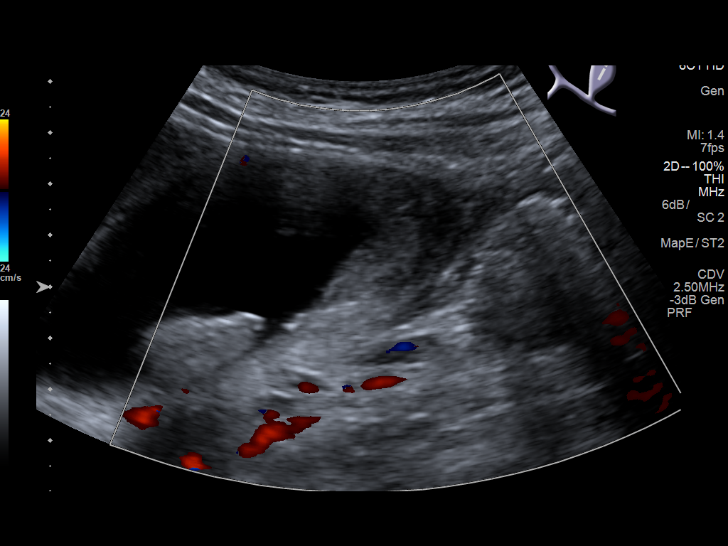
[im 28/68]
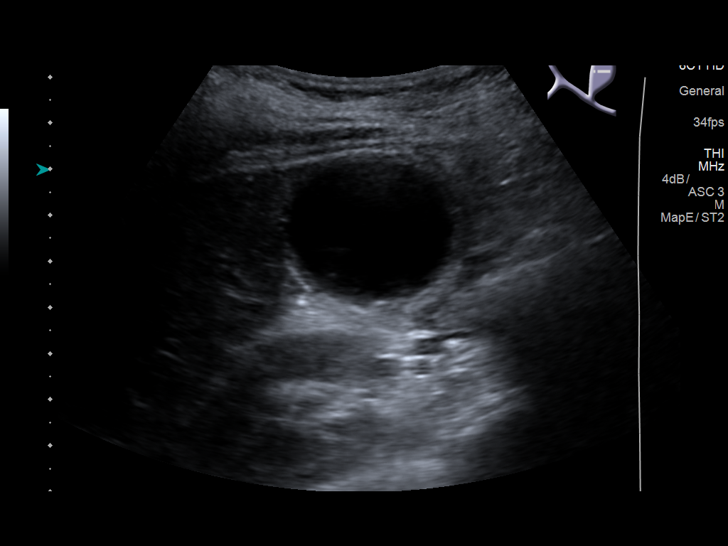
[im 34/68]
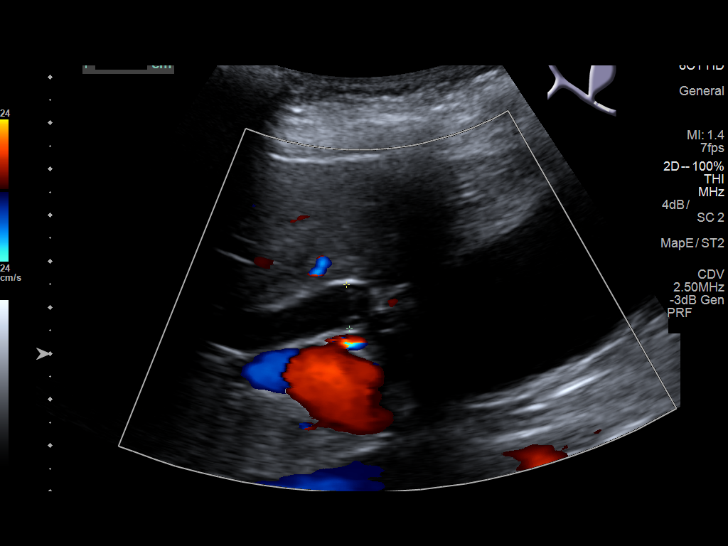
[im 40/68]
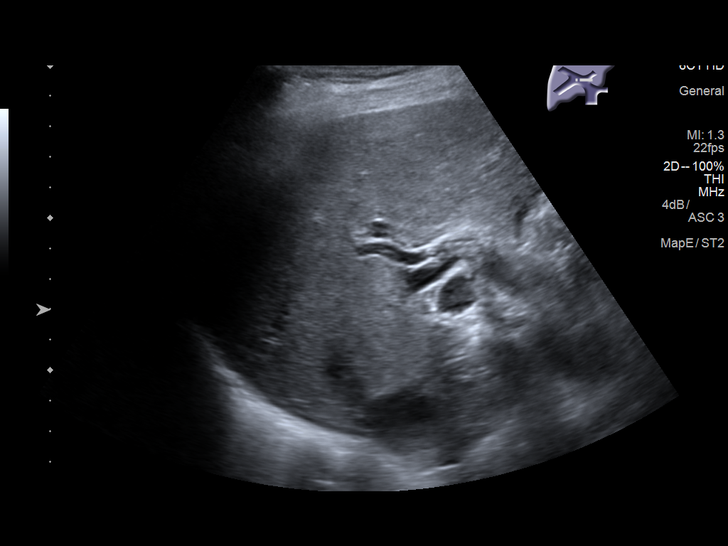
[im 45/68]
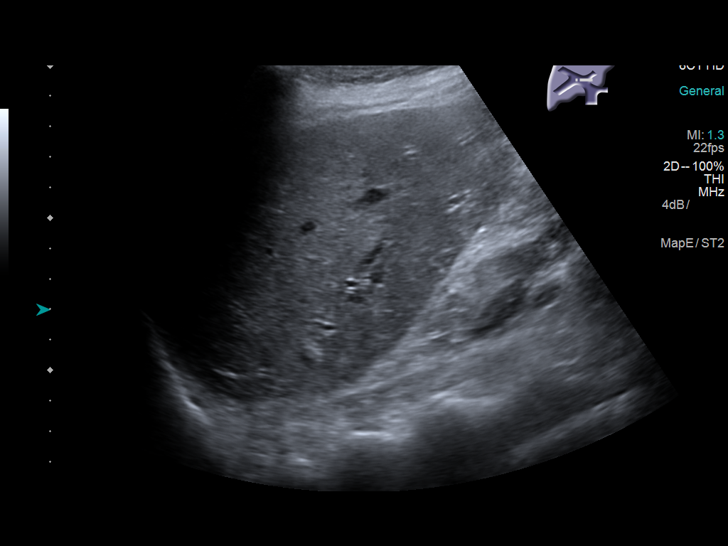
[im 51/68]
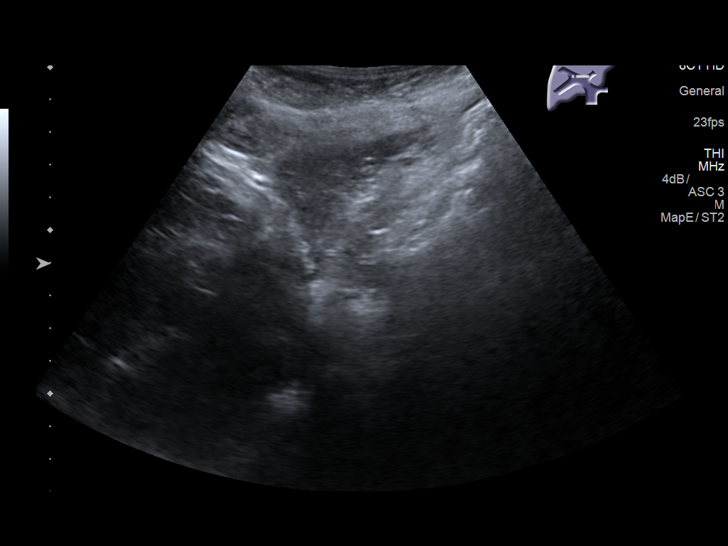
[im 56/68]
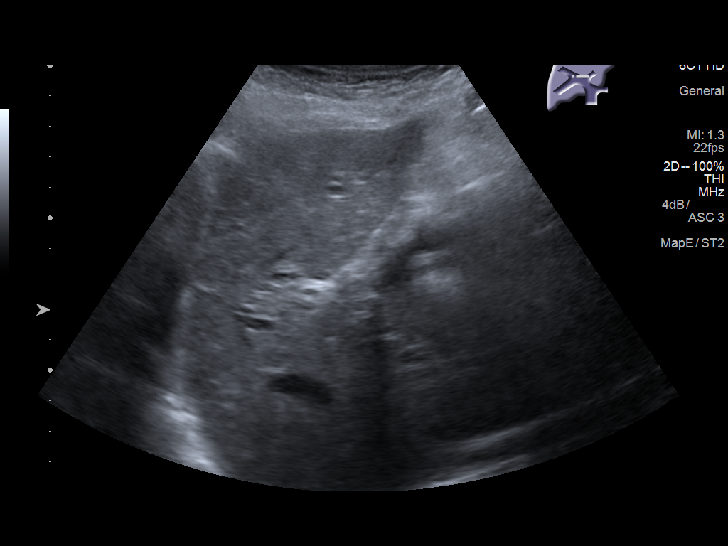
[im 62/68]
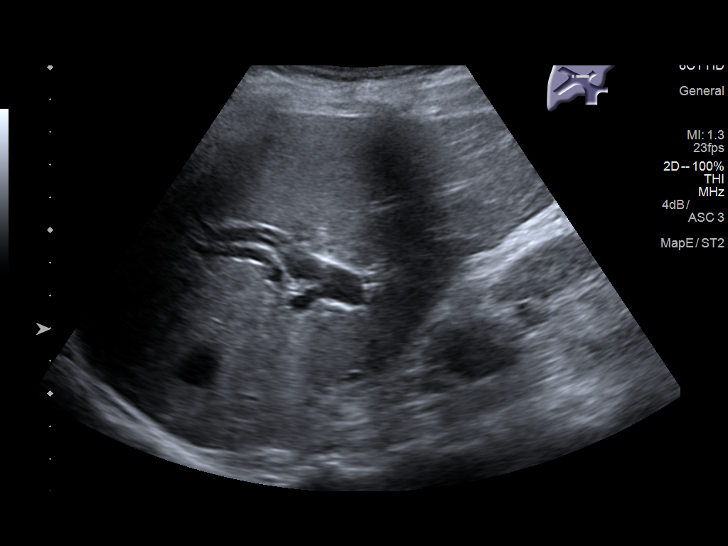
[im 68/68]
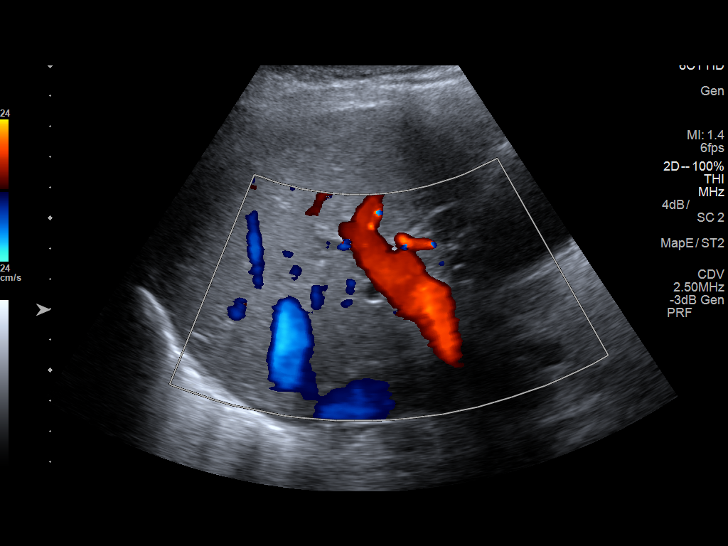

[13 of 25 positions shown; findings below may reference images not displayed]

FINDINGS: Gallbladder:

Large echogenic focus is noted in well distended gallbladder. Mild
posterior acoustical shadowing is noted in this may represent a
prominent 3 cm gallstone. Possibility of a tumefactive sludge ball
would deserve consideration as well. Mild gallbladder sludge is
noted. No wall thickening or pericholecystic fluid is noted.

Common bile duct:

Diameter: 9.6 mm. This is slightly prominent for the patient's given
age and associated with mild intrahepatic ductal dilatation.

Liver:

No focal mass lesion is noted. Mild biliary ductal dilatation is
noted. Portal vein is patent on color Doppler imaging with normal
direction of blood flow towards the liver.
IMPRESSION: Changes of biliary dilatation of both the intrahepatic and
extrahepatic tree. This raises suspicion for distal common bile duct
stone. MRCP or ERCP may be helpful for further evaluation.

Gallbladder sludge and large echogenic focus within the gallbladder.
This may represent a focus of tumefactive sludge or prominent
gallstone. The degree of posterior acoustical shadowing is somewhat
decreased

## 2019-05-22 NOTE — Progress Notes (Signed)
Office Visit Note   Patient: [name]           Date of Birth: [birth year]           MRN: [id number] Visit Date: 05/22/2019 Requested by: [address] [place], [place] [address] [place],  [place] [address] PCP: Carollee Herter, Alferd Apa, DO  Subjective: Chief Complaint  Patient presents with  . Right Hip - Pain    Pain in the groin - intermittent x a couple months, but constant now since 2 days ago. H/o right hip replacement 2013 by Dr. Ninfa Linden.    HPI: She is here with right groin pain.  Intermittent symptoms for a couple months, but in the past week her pain has become constant.  She is status post replacement about 7 years ago by Dr. Ninfa Linden.  She had done very well until recently.  No injury to cause this.  No recent illness, no fevers or chills.  She is on OxyContin chronically per her PCP for fibromyalgia syndrome.  She has a family history of rheumatoid arthritis.  No personal history of RA or gout, but she does have quite a bit of osteoarthritis in multiple joints.              ROS:   All other systems were reviewed and are negative.  Objective: Vital Signs: There were no vitals taken for this visit.  Physical Exam:  General:  Alert and oriented, in no acute distress. Pulm:  Breathing unlabored. Psy:  Normal mood, congruent affect. Skin: No rash. Right hip: She has substantial pain with passive flexion and internal/external rotation of her hip.  Mild tenderness over the greater trochanter.  She is able to flex, abduct and adduct against resistance but each of these movements is painful.  Imaging: X-rays right hip: The prosthesis appears to be intact, no definite loosening compared to 2019 x-rays.  I do not see any fractures in the pelvis or the femur.  Assessment & Plan: 1.  Right groin pain, etiology uncertain.  Cannot rule out loosening of the prosthesis, occult fracture, infection. -We will draw labs to look for infection and to rule out inflammatory  arthropathy. -I will ask Dr. Ninfa Linden to review her films as well when he returns next week. -She did not want anything further for pain medicine. -May need additional imaging if labs are unrevealing and pain worsens.     Procedures: No procedures performed  No notes on file     PMFS History: Patient Active Problem List   Diagnosis Date Noted  . NICM (nonischemic cardiomyopathy) ([place]) 05/19/2019  . Educated about COVID-19 virus infection 09/07/2018  . Erythropoietin deficiency anemia 06/19/2018  . Swelling of calf 02/04/2018  . Bad odor of urine 12/09/2017  . Iron deficiency anemia due to chronic blood loss 08/14/2017  . Iron malabsorption 08/14/2017  . Hyperlipidemia LDL goal <100 07/08/2017  . Elevated ALT measurement   . Hypotension 06/20/2017  . HLD (hyperlipidemia) 06/20/2017  . CAD (coronary artery disease) 06/20/2017  . Protein-calorie malnutrition, severe ([place]) 06/20/2017  . Generalized weakness   . Weakness 05/30/2017  . Focal neurological deficit 05/30/2017  . Chronic diastolic CHF (congestive heart failure) ([place]) 05/30/2017  . TIA (transient ischemic attack) 05/30/2017  . AKI (acute kidney injury) ([place]) 05/30/2017  . Abnormal LFTs 05/30/2017  . Bronchiectasis ([place]) 04/22/2017  . Right otitis media 07/17/2016  . Cerumen impaction 07/17/2016  . Osteoporosis 03/16/2016  . Headache 11/20/2014  .  Skin infection 05/27/2014  . Insomnia 05/27/2014  . Abdominal wall pain 07/09/2013  . Other dysphagia 02/02/2013  . Platelets decreased ([place]) 01/29/2012  . Vitamin D deficiency 12/13/2011  . Hypokalemia 12/13/2011  . Hypocalcemia 12/12/2011  . Adhesions of intestine, very dense, with frozen abdomen 12/11/2011  . SBO (small bowel obstruction), recurrent 12/11/2011  . Protein-calorie malnutrition, moderate ([place]) 12/11/2011  . Oxygen dependent   . Hx of gastric ulcer   . UTI (urinary tract infection) 10/15/2011  . Allergic conjunctivitis 10/15/2011  . Degenerative  arthritis of hip 08/03/2011  . B12 deficiency 07/02/2011  . Bilateral leg weakness 01/16/2011  . Anemia, chronic disease 09/14/2010  . Recurrent pneumonia 08/11/2010  . CRT- [employer] 06/26/2010  . DYSURIA 05/19/2010  . Chronic hypoxemic respiratory failure ([place]) 02/21/2010  . DEHYDRATION 01/06/2010  . GASTROENTERITIS 01/06/2010  . Congestive dilated cardiomyopathy ([place]) 11/17/2009  . LBBB 11/17/2009  . SYSTOLIC HEART FAILURE, CHRONIC 11/17/2009  . GERD (gastroesophageal reflux disease) 02/17/2009  . NONSPECIFIC ABN FINDNG RAD&OTH EXAM BILARY TRCT 09/03/2008  . COPD with emphysema ([place]) 06/28/2008  . DIVERTICULOSIS, COLON 06/03/2008  . GASTRITIS 06/02/2008  . Atrial fibrillation/flutter 12/08/2007  . GASTROPARESIS 08/07/2007  . NAUSEA, CHRONIC 08/07/2007  . Abdominal pain 08/07/2007  . SYNCOPE 02/17/2007  . RESTLESS LEG SYNDROME 08/01/2006  . Chronic pain syndrome 08/01/2006  . OSTEOARTHRITIS 07/31/2006  . Degenerative disc disease, lumbar 07/31/2006  . Fibromyalgia 07/31/2006  . CHRONIC FATIGUE SYNDROME 07/31/2006  . URINARY INCONTINENCE 07/31/2006  . ANEMIA-IRON DEFICIENCY 05/01/2006  . Essential hypertension 05/01/2006   Past Medical History:  Diagnosis Date  . Anemia   . Arthritis    right hip  . Atrial fibrillation ([place])   . CAD (coronary artery disease)    mild disease per cath in 2011  . CHF (congestive heart failure) ([place])   . COPD (chronic obstructive pulmonary disease) ([facility])    continuous O2- 2l  . Diverticulitis   . Elbow fracture, left aug 2012  . Erythropoietin deficiency anemia 06/19/2018  . Fibromyalgia    COUPLE OF TIMES A YEAR-PT HAS EPISODES OF CONFUSION-USUALLY INVOLVES DAY/NIGHT REVERSAL AND EPISODES OF TWITCHING AND FALLS--SEES DR. Jacelyn Grip - NEUROLOGIST-LAST SEEN 07/10/11  . Gastroparesis   . GERD (gastroesophageal reflux disease)   . H/O: GI bleed    from Pradaxa  . Hepatitis    hx of in high school   . HTN (hypertension)    under control; has  been on med. x "years"  . Hx MRSA infection   . Hx of gastric ulcer   . Hx of stress fx aug 2011   right hip   . Iron malabsorption 08/14/2017  . LBBB (left bundle branch block)   . Oxygen dependent    2 liters via nasal cannula at all times  . Pacemaker    CRT therapy; followed by Dr. Caryl Comes  . Pleural effusion     s/p right thoracentesis 03/09  . Pneumonia - 03/09, 12/09, 02/10, 06/10   MOST RECENT FEB 2013  . Primary dilated cardiomyopathy ([facility])    EF 45 to 50% per echo in Jan 2012  . RLS (restless legs syndrome)   . Silent aspiration   . Small bowel obstruction ([place])   . Urinary incontinence     -INTERSTIM IMPLANT NOT FUNCTIONING PER PT  . Venous embolism and thrombosis of subclavian vein ([place])    after pacemaker insertion Oct 2011  . Vitamin B12 deficiency   . Wears glasses    reading  Family History  Problem Relation Age of Onset  . Heart disease Father   . Stroke Mother   . Heart disease Mother   . Heart disease Brother   . Breast cancer Maternal Aunt   . Heart disease Paternal Aunt   . Diabetes Paternal Grandfather   . Colon cancer Neg Hx     Past Surgical History:  Procedure Laterality Date  . APPENDECTOMY    . CARDIAC CATHETERIZATION  04/08/2006, 11/16/2009  . CHOLECYSTECTOMY N/A 06/27/2017   Procedure: OPEN CHOLECYSTECTOMY;  Surgeon: Ralene Ok, MD;  Location: [facility];  Service: General;  Laterality: N/A;  . COLECTOMY     Intestinal resection (5times)  . COLECTOMY  02/04/2000   ex. lap., intra-abd. subtotal colectomy with ileosigmoid colon anastomosies and lysis of adhesions  . CYSTOSCOPY  11/11/2008  . CYSTOSCOPY W/ RETROGRADES  11/11/2008   right  . CYSTOSCOPY WITH INJECTION  04/30/2006   transurethral collagen injection; incision vaginal stenosis  . ERCP N/A 06/23/2017   Procedure: ENDOSCOPIC RETROGRADE CHOLANGIOPANCREATOGRAPHY (ERCP);  Surgeon: Carol Ada, MD;  Location: [facility];  Service: Endoscopy;  Laterality: N/A;  . ERCP N/A 06/24/2017    Procedure: ENDOSCOPIC RETROGRADE CHOLANGIOPANCREATOGRAPHY (ERCP);  Surgeon: [name], MD;  Location: [facility]COPY;  Service: Endoscopy;  Laterality: N/A;  . ESOPHAGOGASTRODUODENOSCOPY N/A 02/02/2013   Procedure: ESOPHAGOGASTRODUODENOSCOPY (EGD);  Surgeon: Lafayette Dragon, MD;  Location: [facility];  Service: Endoscopy;  Laterality: N/A;  . EXPLORATORY LAPAROTOMY  04/27/2009   lysis of adhesions, gastrostomy tube  . GASTROCUTANEOUS FISTULA CLOSURE    . INTERSTIM IMPLANT PLACEMENT  05/28/2006 - stage I   06/05/2006 - stage II  . INTERSTIM IMPLANT PLACEMENT  02/05/2012   Procedure: [name] IMPLANT FIRST STAGE;  Surgeon: Reece Packer, MD;  Location: [facility];  Service: Urology;  Laterality: N/A;  Replacement of Interstim Lead     . INTERSTIM IMPLANT REVISION  03/06/2011   Procedure: REVISION OF [name];  Surgeon: Reece Packer, MD;  Location: [facility];  Service: Urology;  Laterality: N/A;  Replacement of Neurostimulator  . INTERSTIM IMPLANT REVISION  10/23/2007  . IR IMAGING GUIDED PORT INSERTION  10/14/2017  . PACEMAKER PLACEMENT  12/21/2009  . Peg removed     with complications  . PORT-A-CATH REMOVAL  07/27/2011   Procedure: REMOVAL PORT-A-CATH;  Surgeon: Rolm Bookbinder, MD;  Location: [facility];  Service: General;  Laterality: N/A;  . PORTACATH PLACEMENT  12/16/2009  . SAVORY DILATION N/A 02/02/2013   Procedure: SAVORY DILATION;  Surgeon: Lafayette Dragon, MD;  Location: [facility];  Service: Endoscopy;  Laterality: N/A;  . SMALL INTESTINE SURGERY  05/20/2001   ex. lap., resection of small bowel stricture; gastrostomy; insertion central line  . TONSILLECTOMY  age 53  . TOTAL ABDOMINAL HYSTERECTOMY     complete  . TOTAL HIP ARTHROPLASTY  08/03/2011   Procedure: TOTAL HIP ARTHROPLASTY ANTERIOR APPROACH;  Surgeon: Mcarthur Rossetti, MD;  Location: [facility];  Service: Orthopedics;  Laterality: Right;   Social History   Occupational History  . Occupation: disabled     Employer: RETIRED  Tobacco Use  . Smoking status: Former Smoker    Packs/day: 1.00    Years: 20.00    Pack years: 20.00    Quit date: 03/19/1986    Years since quitting: 33.1  . Smokeless tobacco: Never Used  Substance and Sexual Activity  . Alcohol use: No    Alcohol/week: 0.0 standard drinks  . Drug use: No  . Sexual  activity: Yes

## 2019-05-25 ENCOUNTER — Ambulatory Visit (HOSPITAL_COMMUNITY): Payer: HMO | Admitting: Anesthesiology

## 2019-05-25 ENCOUNTER — Encounter (HOSPITAL_COMMUNITY): Payer: Self-pay | Admitting: Pulmonary Disease

## 2019-05-25 ENCOUNTER — Other Ambulatory Visit: Payer: Self-pay

## 2019-05-25 ENCOUNTER — Encounter (HOSPITAL_COMMUNITY)
Admission: RE | Disposition: A | Discharge status: Home / Self Care | Payer: Self-pay | Source: Home / Self Care | Attending: Pulmonary Disease

## 2019-05-25 ENCOUNTER — Ambulatory Visit (HOSPITAL_COMMUNITY)
Admission: RE | Admit: 2019-05-25 | Discharge: 2019-05-25 | Disposition: A | Discharge status: Home / Self Care | Payer: HMO | Attending: Pulmonary Disease | Admitting: Pulmonary Disease

## 2019-05-25 DIAGNOSIS — J439 Emphysema, unspecified: Secondary | ICD-10-CM | POA: Insufficient documentation

## 2019-05-25 DIAGNOSIS — I509 Heart failure, unspecified: Secondary | ICD-10-CM | POA: Insufficient documentation

## 2019-05-25 DIAGNOSIS — I110 Hypertensive heart disease with heart failure: Secondary | ICD-10-CM | POA: Insufficient documentation

## 2019-05-25 DIAGNOSIS — Z9981 Dependence on supplemental oxygen: Secondary | ICD-10-CM | POA: Diagnosis not present

## 2019-05-25 DIAGNOSIS — R9389 Abnormal findings on diagnostic imaging of other specified body structures: Secondary | ICD-10-CM | POA: Diagnosis not present

## 2019-05-25 DIAGNOSIS — Z950 Presence of cardiac pacemaker: Secondary | ICD-10-CM | POA: Diagnosis not present

## 2019-05-25 DIAGNOSIS — Z96649 Presence of unspecified artificial hip joint: Secondary | ICD-10-CM | POA: Insufficient documentation

## 2019-05-25 DIAGNOSIS — K219 Gastro-esophageal reflux disease without esophagitis: Secondary | ICD-10-CM | POA: Diagnosis not present

## 2019-05-25 DIAGNOSIS — J470 Bronchiectasis with acute lower respiratory infection: Secondary | ICD-10-CM | POA: Diagnosis not present

## 2019-05-25 DIAGNOSIS — Z86718 Personal history of other venous thrombosis and embolism: Secondary | ICD-10-CM | POA: Insufficient documentation

## 2019-05-25 DIAGNOSIS — Z8673 Personal history of transient ischemic attack (TIA), and cerebral infarction without residual deficits: Secondary | ICD-10-CM | POA: Diagnosis not present

## 2019-05-25 DIAGNOSIS — R918 Other nonspecific abnormal finding of lung field: Secondary | ICD-10-CM | POA: Insufficient documentation

## 2019-05-25 DIAGNOSIS — Z87891 Personal history of nicotine dependence: Secondary | ICD-10-CM | POA: Insufficient documentation

## 2019-05-25 DIAGNOSIS — I2510 Atherosclerotic heart disease of native coronary artery without angina pectoris: Secondary | ICD-10-CM | POA: Insufficient documentation

## 2019-05-25 DIAGNOSIS — Z8614 Personal history of Methicillin resistant Staphylococcus aureus infection: Secondary | ICD-10-CM | POA: Insufficient documentation

## 2019-05-25 DIAGNOSIS — Z79899 Other long term (current) drug therapy: Secondary | ICD-10-CM | POA: Diagnosis not present

## 2019-05-25 DIAGNOSIS — J189 Pneumonia, unspecified organism: Secondary | ICD-10-CM | POA: Insufficient documentation

## 2019-05-25 DIAGNOSIS — R05 Cough: Secondary | ICD-10-CM | POA: Insufficient documentation

## 2019-05-25 DIAGNOSIS — I428 Other cardiomyopathies: Secondary | ICD-10-CM | POA: Diagnosis not present

## 2019-05-25 DIAGNOSIS — R0602 Shortness of breath: Secondary | ICD-10-CM

## 2019-05-25 DIAGNOSIS — J479 Bronchiectasis, uncomplicated: Secondary | ICD-10-CM | POA: Diagnosis not present

## 2019-05-25 HISTORY — PX: BRONCHIAL WASHINGS: SHX5105

## 2019-05-25 HISTORY — DX: Presence of spectacles and contact lenses: Z97.3

## 2019-05-25 HISTORY — PX: VIDEO BRONCHOSCOPY: SHX5072

## 2019-05-25 LAB — BODY FLUID CELL COUNT WITH DIFFERENTIAL
Lymphs, Fluid: 6 %
Monocyte-Macrophage-Serous Fluid: 5 % — ABNORMAL LOW (ref 50–90)
Neutrophil Count, Fluid: 89 % — ABNORMAL HIGH (ref 0–25)
Total Nucleated Cell Count, Fluid: 4095 cu mm — ABNORMAL HIGH (ref 0–1000)

## 2019-05-25 SURGERY — VIDEO BRONCHOSCOPY WITHOUT FLUORO
Anesthesia: Monitor Anesthesia Care

## 2019-05-25 MED ORDER — BUTAMBEN-TETRACAINE-BENZOCAINE 2-2-14 % EX AERO: 1.0000 | INHALATION_SPRAY | Freq: Once | CUTANEOUS | Status: DC

## 2019-05-25 MED ORDER — HEPARIN SOD (PORK) LOCK FLUSH 100 UNIT/ML IV SOLN
500.0000 [IU] | INTRAVENOUS | Status: AC | PRN
  Administered 2019-05-25: 500 [IU]

## 2019-05-25 MED ORDER — PHENYLEPHRINE HCL 0.25 % NA SOLN: 1.0000 | Freq: Four times a day (QID) | NASAL | Status: DC | PRN

## 2019-05-25 MED ORDER — LIDOCAINE HCL URETHRAL/MUCOSAL 2 % EX GEL: 1.0000 "application " | Freq: Once | CUTANEOUS | Status: DC

## 2019-05-25 MED ORDER — SODIUM CHLORIDE 0.9 % IV SOLN: INTRAVENOUS | Status: DC | PRN

## 2019-05-25 MED ORDER — PROPOFOL 10 MG/ML IV BOLUS
INTRAVENOUS | Status: DC | PRN
  Administered 2019-05-25: 20 mg via INTRAVENOUS

## 2019-05-25 MED ORDER — LIDOCAINE HCL (PF) 1 % IJ SOLN
INTRAMUSCULAR | Status: DC | PRN
  Administered 2019-05-25 (×3): 2 mL

## 2019-05-25 MED ORDER — BUTAMBEN-TETRACAINE-BENZOCAINE 2-2-14 % EX AERO
INHALATION_SPRAY | CUTANEOUS | Status: DC | PRN
  Administered 2019-05-25: 2 via TOPICAL

## 2019-05-25 MED ORDER — PROPOFOL 500 MG/50ML IV EMUL
INTRAVENOUS | Status: DC | PRN
  Administered 2019-05-25: 125 ug/kg/min via INTRAVENOUS

## 2019-05-25 MED ORDER — LIDOCAINE 2% (20 MG/ML) 5 ML SYRINGE
INTRAMUSCULAR | Status: DC | PRN
  Administered 2019-05-25: 20 mg via INTRAVENOUS

## 2019-05-25 NOTE — Op Note (Signed)
[facility] Cardiop[facility] Patient Name: [name] Procedure Date: 05/25/2019 MRN: [id number] Attending MD: Chesley Mires , MD Date of Birth: [birth year] CSN: [id number] Age: 74 Admit Type: Outpatient Ethnicity: Not Hispanic or Latino Procedure:             Bronchoscopy Indications:           Diagnostic bronchoalveolar lavage, Chronic cough with                         abnormal CT, Bronchiectasis, Atypical pneumonia Providers:             Chesley Mires, MD, Cleda Daub, RN, Cherylynn Ridges,                         Technician Referring MD:           Medicines:             Lidocaine 2% applied to cords 4 mL, Lidocaine 2%                         applied to the tracheobronchial tree 2 mL Complications:         No immediate complications Estimated Blood Loss:  Estimated blood loss: none. Procedure:      Pre-Anesthesia Assessment:      - A History and Physical has been performed. The patient's medications,       allergies and sensitivities have been reviewed.      - The risks and benefits of the procedure and the sedation options and       risks were discussed with the patient. All questions were answered and       informed consent was obtained.      - Using IV propofol under the supervision of a CRNA was determined to be       medically necessary for this procedure based on review of the patient's       medical history, medications, and prior anesthesia history.      After obtaining informed consent, the bronchoscope was passed under       direct vision. Throughout the procedure, the patient's blood pressure,       pulse, and oxygen saturations were monitored continuously. the BF-H190       ([id number]) Olympus Bronchoscope was introduced through the mouth and       advanced to the tracheobronchial tree of both lungs. The procedure was       accomplished without difficulty. The patient tolerated the procedure       well. The total duration of the procedure was 7 hours and  10 minutes. Findings:      Nasopharynx/oropharynx: Bronchoscope entered orally and advanced to       vocal cords.      Vocal cords had normal motion. Instilled 4 ml of lidocaine to vocal       cords.      Bronchoscope then entered into the trachea and advanced to the carina       which was sharp.      Bronchoscope entered into left main bronchus. Left upper, lingular, and       lower lobe orifices visualized. Yellow secretions noted from all       orifices that was easily suctioned. No endobronchial lesions.      Bronchoscope then transitoned into the right main bronchus. Right upper,  middle, and lower lobes visualized. Yellow secretions noted from all       orifices that was easily suctioned. No endobronchial lesions.      Bronchoalveolar lavage was performed in the right upper lobe of the lung       and sent for cell count, bacterial culture, viral smears & culture, and       fungal & AFB analysis and cytology. 50 mL of fluid were instilled. 20 mL       were returned. The return was cloudy. Impression:      - Bronchoalveolar lavage      - Chronic cough with abnormal CT      - Bronchiectasis      - Atypical pneumonia      - No specimens collected.      - Bronchoalveolar lavage was performed. Moderate Sedation:      Moderate (conscious) sedation was personally administered by an       anesthesia professional. The following parameters were monitored: oxygen       saturation, heart rate, blood pressure, and response to care. Total       physician intraservice time was 10 minutes. Recommendation:      - Await BAL and cytology results. Procedure Code(s):      --- Professional ---      [phone], Bronchoscopy, rigid or flexible, including fluoroscopic guidance,       when performed; with bronchial alveolar lavage Diagnosis Code(s):      --- Professional ---      R05, Cough      R91.8, Other nonspecific abnormal finding of lung field      J47.9, Bronchiectasis, uncomplicated       R42.7, Pneumonia, unspecified organism CPT copyright 2019 American Medical Association. All rights reserved. The codes documented in this report are preliminary and upon coder review may  be revised to meet current compliance requirements. Chesley Mires, MD Chesley Mires, MD 05/25/2019 10:08:32 AM This report has been signed electronically. Number of Addenda: 0 Scope In: Scope Out:

## 2019-05-25 NOTE — Anesthesia Postprocedure Evaluation (Signed)
Anesthesia Post Note  Patient: PERLA ECHAVARRIA  Procedure(s) Performed: VIDEO BRONCHOSCOPY WITHOUT FLUORO (N/A ) BRONCHIAL WASHINGS     Patient location during evaluation: Endoscopy Anesthesia Type: MAC Level of consciousness: awake Pain management: pain level controlled Vital Signs Assessment: post-procedure vital signs reviewed and stable Respiratory status: spontaneous breathing, nonlabored ventilation, respiratory function stable and patient connected to nasal cannula oxygen Cardiovascular status: stable and blood pressure returned to baseline Postop Assessment: no apparent nausea or vomiting Anesthetic complications: no    Last Vitals:  Vitals:   05/25/19 1015 05/25/19 1020  BP:  (!) 141/54  Pulse: 66 66  Resp: 12 13  Temp:    SpO2: 100% 100%    Last Pain:  Vitals:   05/25/19 1003  TempSrc: Temporal  PainSc:                  Karyl Kinnier Margalit Leece

## 2019-05-25 NOTE — Anesthesia Preprocedure Evaluation (Addendum)
Anesthesia Evaluation  Patient identified by MRN, date of birth, ID band Patient awake    Reviewed: Allergy & Precautions, NPO status , Patient's Chart, lab work & pertinent test results  Airway Mallampati: II  TM Distance: >3 FB Neck ROM: Full    Dental no notable dental hx.    Pulmonary COPD,  COPD inhaler and oxygen dependent, former smoker,    Pulmonary exam normal breath sounds clear to auscultation       Cardiovascular hypertension, Pt. on medications and Pt. on home beta blockers + CAD, +CHF and + DVT  Normal cardiovascular exam+ pacemaker  Rhythm:Regular Rate:Normal  ECG: Rate 67  Patient reports chronic hypotension   Neuro/Psych  Headaches, TIAnegative psych ROS   GI/Hepatic Neg liver ROS, GERD  Medicated and Controlled,  Endo/Other  negative endocrine ROS  Renal/GU negative Renal ROS     Musculoskeletal  (+) Fibromyalgia -, narcotic dependentRLS (restless legs syndrome)   Abdominal   Peds  Hematology  (+) anemia , HLD   Anesthesia Other Findings BRONCHIECTASIS  Reproductive/Obstetrics                            Anesthesia Physical Anesthesia Plan  ASA: IV  Anesthesia Plan: MAC   Post-op Pain Management:    Induction: Intravenous  PONV Risk Score and Plan: 2 and Propofol infusion and Treatment may vary due to age or medical condition  Airway Management Planned: Nasal Cannula  Additional Equipment:   Intra-op Plan:   Post-operative Plan:   Informed Consent: I have reviewed the patients History and Physical, chart, labs and discussed the procedure including the risks, benefits and alternatives for the proposed anesthesia with the patient or authorized representative who has indicated his/her understanding and acceptance.     Dental advisory given  Plan Discussed with: CRNA  Anesthesia Plan Comments:        Anesthesia Quick Evaluation

## 2019-05-25 NOTE — H&P (Signed)
PCCM History and Physical  Name: [name] DOB: 1945-10-17 MRN: [id number]  CC: Cough  History: 74 year female former smoker with history of COPD/emphysema, chronic hypoxic respiratory failure and recurrent pneumonia with bronchiectasis was having increased cough and dyspnea.  CT chest showed changes suggestive of MAI.  She has not been able to produce adequate sputum samples.  She presents today for bronchoscopy with BAL.  Past medical history: She  has a past medical history of Anemia, Arthritis, Atrial fibrillation ([place]), CAD (coronary artery disease), CHF (congestive heart failure) ([place]), COPD (chronic obstructive pulmonary disease) ([place]), Diverticulitis, Elbow fracture, left (aug 2012), Erythropoietin deficiency anemia (06/19/2018), Fibromyalgia, Gastroparesis, GERD (gastroesophageal reflux disease), H/O: GI bleed, Hepatitis, HTN (hypertension), MRSA infection, gastric ulcer, stress fx (aug 2011), Iron malabsorption (08/14/2017), LBBB (left bundle branch block), Oxygen dependent, Pacemaker, Pleural effusion, Pneumonia (- 03/09, 12/09, 02/10, 06/10), Primary dilated cardiomyopathy ([place]), RLS (restless legs syndrome), Silent aspiration, Small bowel obstruction ([place]), Urinary incontinence, Venous embolism and thrombosis of subclavian vein ([place]), Vitamin B12 deficiency, and Wears glasses.  Past surgical history: She  has a past surgical history that includes Appendectomy; pacemaker placement (12/21/2009); Peg removed; Tonsillectomy (age 36); Interstim implant revision (03/06/2011); Portacath placement (12/16/2009); Total abdominal hysterectomy; Interstim Implant placement (05/28/2006 - stage I); Interstim implant revision (10/23/2007); Cystoscopy with injection (04/30/2006); Small intestine surgery (05/20/2001); Cardiac catheterization (04/08/2006, 11/16/2009); Cystoscopy (11/11/2008); Cystoscopy w/ retrogrades (11/11/2008); Colectomy; Colectomy (02/04/2000); Gastrocutaneous fistula closure; Exploratory  laparotomy (04/27/2009); Port-a-cath removal (07/27/2011); Total hip arthroplasty (08/03/2011); Interstim Implant placement (02/05/2012); Esophagogastroduodenoscopy (N/A, 02/02/2013); Savory dilation (N/A, 02/02/2013); ERCP (N/A, 06/23/2017); ERCP (N/A, 06/24/2017); Cholecystectomy (N/A, 06/27/2017); and IR IMAGING GUIDED PORT INSERTION (10/14/2017).  Family history: Her family history includes Breast cancer in her maternal aunt; Diabetes in her paternal grandfather; Heart disease in her brother, father, mother, and paternal aunt; Stroke in her mother.   Social history: She  reports that she quit smoking about 33 years ago. She has a 20.00 pack-year smoking history. She has never used smokeless tobacco. She reports that she does not drink alcohol or use drugs.    Allergies  Allergen Reactions  . Dabigatran Etexilate Mesylate Other (See Comments)    INTERNAL BLEEDING-pradaxa  . Augmentin [Amoxicillin-Pot Clavulanate] Diarrhea and Nausea Only  . Talwin [Pentazocine] Other (See Comments)    hallucinations  . Lactate Rash    Outpatient medications: No current facility-administered medications on file prior to encounter.   Current Outpatient Medications on File Prior to Encounter  Medication Sig  . acetaminophen (TYLENOL) 650 MG CR tablet Take 650 mg by mouth every 8 (eight) hours as needed for pain.  [name] albuterol (VENTOLIN HFA) 108 (90 Base) MCG/ACT inhaler Inhale 2 puffs into the lungs every 6 (six) hours as needed for wheezing or shortness of breath.  [name] amLODipine (NORVASC) 2.5 MG tablet Take 1 tablet (2.5 mg total) by mouth daily.  [name] apixaban (ELIQUIS) 5 MG TABS tablet Take 1 tablet (5 mg total) by mouth 2 (two) times daily.  [name] atorvastatin (LIPITOR) 20 MG tablet TAKE 1 TABLET BY MOUTH ONCE DAILY AT  6PM (Patient taking differently: Take 20 mg by mouth daily at 6 PM. )  . carvedilol (COREG) 25 MG tablet Take 1 tablet (25 mg total) by mouth 2 (two) times daily.  . furosemide (LASIX) 40 MG tablet  TAKE 1 TABLET BY MOUTH IN THE MORNING (Patient taking differently: Take 40 mg by mouth daily. )  . lidocaine-prilocaine (EMLA) cream Apply 1 application topically as needed. (Patient taking  differently: Apply 1 application topically as needed [facility]). )  . nitrofurantoin, macrocrystal-monohydrate, (MACROBID) 100 MG capsule Take 100 mg by mouth daily. UTI prophylaxis prescribed by urology   . oxyCODONE-acetaminophen (PERCOCET) 10-325 MG tablet Take 1 tablet by mouth every 4 (four) hours as needed. PAIN (Patient taking differently: Take 1 tablet by mouth every 4 (four) hours as needed for pain. PAIN)  . OXYGEN Inhale 3 L into the lungs continuous.  . pantoprazole (PROTONIX) 40 MG tablet Take 1 tablet by mouth twice daily (Patient taking differently: Take 40 mg by mouth 2 (two) times daily. )  . promethazine (PHENERGAN) 25 MG tablet Take 25 mg by mouth every 6 (six) hours as needed for nausea or vomiting.  [name] tiZANidine (ZANAFLEX) 4 MG tablet Take 1 tablet (4 mg total) by mouth 3 (three) times daily as needed. for muscle spams (Patient taking differently: Take 4 mg by mouth in the morning and at bedtime. for muscle spams)  . triamcinolone ointment (KENALOG) 0.1 % Apply 1 application topically daily as needed (psoriasis).   [name] umeclidinium bromide (INCRUSE ELLIPTA) 62.5 MCG/INH AEPB Inhale 1 puff into the lungs daily.  [name] zolpidem (AMBIEN) 5 MG tablet TAKE 1 TABLET BY MOUTH EVERY DAY AT BEDTIME AS NEEDED (Patient taking differently: Take 5 mg by mouth at bedtime. )  . denosumab (PROLIA) 60 MG/ML SOSY injection Inject 60 mg into the skin every 6 (six) months.  . nitroGLYCERIN (NITROSTAT) 0.4 MG SL tablet Place 1 tablet (0.4 mg total) under the tongue every 5 (five) minutes as needed. CHEST PAIN  . [DISCONTINUED] iron dextran complex (INFED) 50 MG/ML injection Please give infed infusion (no test dose needed) over 4 hours per pharmacy calculated dose. Ht: 5'2 Wt: 99 pounds Hgb: 12    Vital signs: BP (!)  90/45   Pulse 60   Temp 98.5 F (36.9 C) (Oral)   Resp 20   Ht 5' (1.524 m)   Wt 41.3 kg   SpO2 100%   BMI 17.78 kg/m   Physical exam: General - alert Eyes - pupils reactive ENT - no sinus tenderness, no stridor Cardiac - regular rate/rhythm, no murmur Chest - equal breath sounds b/l, no wheezing or rales Abdomen - soft, non tender, + bowel sounds Extremities - no cyanosis, clubbing, or edema Skin - no rashes Neuro - normal strength, moves extremities, follows commands Psych - normal mood and behavior   SARS CoV2 TAT 05/22/19 >> negative  HRCT chest 04/27/19 >> atherosclerosis, extensive peribronchovascular nodularity and nodular consolidation with cylindrical bronchiectasis, progressive from 03/28/2017  Assessment: 74 yo female with history of recurrent pneumonia, bronchiectasis, and COPD with emphysema.  She has progressive cough and dyspnea.  Recent CT chest findings suggestive of atypical infection, such as MAI.  She has been unable to produce adequate sputum samples.  Plan: Bronchoscopy procedure discussed.  Risks detailed as bleeding, infection, pneumothorax, respiratory failure, and non diagnosis.  She is agreeable to proceed.  Will plan for inspection bronchoscopy and bronchoalveolar lavage.  Becky Mires, MD [facility] Pulmonary/Critical Care 05/25/2019, 8:46 AM

## 2019-05-25 NOTE — Transfer of Care (Signed)
Immediate Anesthesia Transfer of Care Note  Patient: [name]  Procedure(s) Performed: VIDEO BRONCHOSCOPY WITHOUT FLUORO (N/A ) BRONCHIAL WASHINGS  Patient Location: PACU  Anesthesia Type:MAC  Level of Consciousness: awake, alert , oriented and patient cooperative  Airway & Oxygen Therapy: Patient Spontanous Breathing and Patient connected to face mask oxygen  Post-op Assessment: Report given to RN, Post -op Vital signs reviewed and stable and Patient moving all extremities  Post vital signs: Reviewed and stable  Last Vitals:  Vitals Value Taken Time  BP    Temp    Pulse    Resp    SpO2      Last Pain:  Vitals:   05/25/19 0750  TempSrc: Oral  PainSc: 0-No pain         Complications: No apparent anesthesia complications

## 2019-05-26 ENCOUNTER — Encounter: Payer: Self-pay | Admitting: *Deleted

## 2019-05-26 ENCOUNTER — Telehealth: Payer: Self-pay | Admitting: Family Medicine

## 2019-05-26 LAB — CBC WITH DIFFERENTIAL/PLATELET
Absolute Monocytes: 442 cells/uL (ref 200–950)
Basophils Absolute: 39 cells/uL (ref 0–200)
Basophils Relative: 0.7 %
Eosinophils Absolute: 213 cells/uL (ref 15–500)
Eosinophils Relative: 3.8 %
HCT: 36.8 % (ref 35.0–45.0)
Hemoglobin: 12 g/dL (ref 11.7–15.5)
Lymphs Abs: 661 cells/uL — ABNORMAL LOW (ref 850–3900)
MCH: 29.5 pg (ref 27.0–33.0)
MCHC: 32.6 g/dL (ref 32.0–36.0)
MCV: 90.4 fL (ref 80.0–100.0)
MPV: 11.7 fL (ref 7.5–12.5)
Monocytes Relative: 7.9 %
Neutro Abs: 4245 cells/uL (ref 1500–7800)
Neutrophils Relative %: 75.8 %
Platelets: 230 10*3/uL (ref 140–400)
RBC: 4.07 10*6/uL (ref 3.80–5.10)
RDW: 13.9 % (ref 11.0–15.0)
Total Lymphocyte: 11.8 %
WBC: 5.6 10*3/uL (ref 3.8–10.8)

## 2019-05-26 LAB — ACID FAST SMEAR (AFB, MYCOBACTERIA): Acid Fast Smear: NEGATIVE

## 2019-05-26 LAB — C-REACTIVE PROTEIN: CRP: 1.9 mg/L (ref <8.0)

## 2019-05-26 LAB — CYCLIC CITRUL PEPTIDE ANTIBODY, IGG: Cyclic Citrullin Peptide Ab: <16 UNITS

## 2019-05-26 LAB — URIC ACID: Uric Acid, Serum: 6.4 mg/dL (ref 2.5–7.0)

## 2019-05-26 LAB — CYTOLOGY - NON PAP

## 2019-05-26 LAB — ANTI-NUCLEAR AB-TITER (ANA TITER)
ANA TITER: 1:80 {titer} — ABNORMAL HIGH
ANA Titer 1: 1:80 {titer} — ABNORMAL HIGH

## 2019-05-26 LAB — ANA: Anti Nuclear Antibody (ANA): POSITIVE — AB

## 2019-05-26 LAB — SEDIMENTATION RATE: Sed Rate: 22 mm/h (ref 0–30)

## 2019-05-26 LAB — RHEUMATOID FACTOR: Rheumatoid fact SerPl-aCnc: <14 IU/mL (ref <14)

## 2019-05-26 NOTE — Telephone Encounter (Signed)
Labs are normal except for a mildly positive ANA.

## 2019-05-27 ENCOUNTER — Ambulatory Visit (INDEPENDENT_AMBULATORY_CARE_PROVIDER_SITE_OTHER): Payer: HMO | Admitting: Orthopaedic Surgery

## 2019-05-27 ENCOUNTER — Other Ambulatory Visit: Payer: Self-pay

## 2019-05-27 ENCOUNTER — Encounter: Payer: Self-pay | Admitting: Orthopaedic Surgery

## 2019-05-27 VITALS — Ht 60.0 in | Wt 91.0 lb

## 2019-05-27 DIAGNOSIS — Z96641 Presence of right artificial hip joint: Secondary | ICD-10-CM

## 2019-05-27 DIAGNOSIS — M25551 Pain in right hip: Secondary | ICD-10-CM

## 2019-05-27 LAB — CULTURE, RESPIRATORY W GRAM STAIN: Culture: NORMAL

## 2019-05-27 MED ORDER — METHYLPREDNISOLONE 4 MG PO TABS: ORAL_TABLET | ORAL | 0 refills | Status: DC

## 2019-05-27 MED ORDER — LIDOCAINE HCL 1 % IJ SOLN
3.0000 mL | INTRAMUSCULAR | Status: AC | PRN
  Administered 2019-05-27: 3 mL

## 2019-05-27 MED ORDER — METHYLPREDNISOLONE ACETATE 40 MG/ML IJ SUSP
40.0000 mg | INTRAMUSCULAR | Status: AC | PRN
  Administered 2019-05-27: 40 mg via INTRA_ARTICULAR

## 2019-05-27 NOTE — Progress Notes (Signed)
Office Visit Note   Patient: [name]           Date of Birth: Jul 30, 1945           MRN: [id number] Visit Date: 05/27/2019              Requested by: [address], [place], [place] [address] [place],  [place] [address] PCP: Carollee Herter, Alferd Apa, DO   Assessment & Plan: Visit Diagnoses:  1. Pain in right hip   2. History of total right hip replacement     Plan: The pain that she is describing may be radicular in nature.  In the past she has had some type of intervention her lumbar spine by Dr. Ernestina Patches.  We will have to try to find out at some point what that was.  However today, I would like to try a steroid injection over the trochanteric area of the right hip and put her on a 6-day steroid taper.  She agrees with this treatment plan and tolerated the injection well.  She has had injections in the past and is fully aware of the risk and benefits of injections.  She is ambulating easily without an assistive device but is having pain.  She is also again on supplemental oxygen.  We will see her back in 4 weeks to see how she is doing overall.  If she continues to experience pain we may see about having Dr. Ernestina Patches evaluate her for some type of right-sided epidural injection.  Follow-Up Instructions: Return in about 4 weeks (around 06/24/2019).   Orders:  Orders Placed This Encounter  Procedures  . Large Joint Inj   Meds ordered this encounter  Medications  . methylPREDNISolone (MEDROL) 4 MG tablet    Sig: Medrol dose pack. Take as instructed    Dispense:  21 tablet    Refill:  0      Procedures: Large Joint Inj: R greater trochanter on 05/27/2019 2:24 PM Indications: pain and diagnostic evaluation Details: 22 G 1.5 in needle, lateral approach  Arthrogram: No  Medications: 3 mL lidocaine 1 %; 40 mg methylPREDNISolone acetate 40 MG/ML Outcome: tolerated well, no immediate complications Procedure, treatment alternatives, risks and benefits explained, specific  risks discussed. Consent was given by the patient. Immediately prior to procedure a time out was called to verify the correct patient, procedure, equipment, support staff and site/side marked as required. Patient was prepped and draped in the usual sterile fashion.       Clinical Data: No additional findings.   Subjective: Chief Complaint  Patient presents with  . Right Hip - Pain  The patient is well-known to [place].  She does have a history of a right total hip arthroplasty done many years ago.  This was done through an anterior approach.  She is someone who is on supplemental oxygen.  She is a very cachectic individual.  She has been experiencing right hip pain for several months now but it is gotten worse recently.  She has some pain in the groin but also on the lateral aspect of her hip remained down for some past her knee.  Her pain does come and go and it sounds like it is activity related at times.  She is on chronic medications including Neurontin and pain medications.  She has chronic fibromyalgia as well.  HPI  Review of Systems She denies any fever or chills.  She denies any chest pain  Objective: Vital Signs:  Ht 5' (1.524 m)   Wt 91 lb (41.3 kg)   BMI 17.77 kg/m   Physical Exam She is alert and orient x3 and in no acute distress Ortho Exam Examination of her right hip with her sitting shows that I can move it through internal and external rotation with no pain at all.  I can compress the hip but this does not cause pain.  There is pain to palpation of the trochanteric area and IT band. Specialty Comments:  No specialty comments available.  Imaging: No results found. Plain films done a week ago in the office of her pelvis and right hip shows a total hip arthroplasty with no complicating features.  PMFS History: Patient Active Problem List   Diagnosis Date Noted  . History of total right hip replacement 05/27/2019  . NICM (nonischemic cardiomyopathy) ([place]) 05/19/2019   . Educated about COVID-19 virus infection 09/07/2018  . Erythropoietin deficiency anemia 06/19/2018  . Swelling of calf 02/04/2018  . Bad odor of urine 12/09/2017  . Iron deficiency anemia due to chronic blood loss 08/14/2017  . Iron malabsorption 08/14/2017  . Hyperlipidemia LDL goal <100 07/08/2017  . Elevated ALT measurement   . Hypotension 06/20/2017  . HLD (hyperlipidemia) 06/20/2017  . CAD (coronary artery disease) 06/20/2017  . Protein-calorie malnutrition, severe ([place]) 06/20/2017  . Generalized weakness   . Weakness 05/30/2017  . Focal neurological deficit 05/30/2017  . Chronic diastolic CHF (congestive heart failure) ([place]) 05/30/2017  . TIA (transient ischemic attack) 05/30/2017  . AKI (acute kidney injury) ([place]) 05/30/2017  . Abnormal LFTs 05/30/2017  . Bronchiectasis ([place]) 04/22/2017  . Right otitis media 07/17/2016  . Cerumen impaction 07/17/2016  . Osteoporosis 03/16/2016  . Headache 11/20/2014  . Skin infection 05/27/2014  . Insomnia 05/27/2014  . Abdominal wall pain 07/09/2013  . Other dysphagia 02/02/2013  . Platelets decreased ([place]) 01/29/2012  . Vitamin D deficiency 12/13/2011  . Hypokalemia 12/13/2011  . Hypocalcemia 12/12/2011  . Adhesions of intestine, very dense, with frozen abdomen 12/11/2011  . SBO (small bowel obstruction), recurrent 12/11/2011  . Protein-calorie malnutrition, moderate ([place]) 12/11/2011  . Oxygen dependent   . Hx of gastric ulcer   . UTI (urinary tract infection) 10/15/2011  . Allergic conjunctivitis 10/15/2011  . Degenerative arthritis of hip 08/03/2011  . B12 deficiency 07/02/2011  . Bilateral leg weakness 01/16/2011  . Anemia, chronic disease 09/14/2010  . Recurrent pneumonia 08/11/2010  . CRT- [employer] 06/26/2010  . DYSURIA 05/19/2010  . Chronic hypoxemic respiratory failure ([place]) 02/21/2010  . DEHYDRATION 01/06/2010  . GASTROENTERITIS 01/06/2010  . Congestive dilated cardiomyopathy ([place]) 11/17/2009  . LBBB 11/17/2009   . SYSTOLIC HEART FAILURE, CHRONIC 11/17/2009  . GERD (gastroesophageal reflux disease) 02/17/2009  . NONSPECIFIC ABN FINDNG RAD&OTH EXAM BILARY TRCT 09/03/2008  . COPD with emphysema ([place]) 06/28/2008  . DIVERTICULOSIS, COLON 06/03/2008  . GASTRITIS 06/02/2008  . Atrial fibrillation/flutter 12/08/2007  . GASTROPARESIS 08/07/2007  . NAUSEA, CHRONIC 08/07/2007  . Abdominal pain 08/07/2007  . SYNCOPE 02/17/2007  . RESTLESS LEG SYNDROME 08/01/2006  . Chronic pain syndrome 08/01/2006  . OSTEOARTHRITIS 07/31/2006  . Degenerative disc disease, lumbar 07/31/2006  . Fibromyalgia 07/31/2006  . CHRONIC FATIGUE SYNDROME 07/31/2006  . URINARY INCONTINENCE 07/31/2006  . ANEMIA-IRON DEFICIENCY 05/01/2006  . Essential hypertension 05/01/2006   Past Medical History:  Diagnosis Date  . Anemia   . Arthritis    right hip  . Atrial fibrillation ([place])   . CAD (coronary artery disease)    mild disease per  cath in 2011  . CHF (congestive heart failure) ([place])   . COPD (chronic obstructive pulmonary disease) ([facility])    continuous O2- 2l  . Diverticulitis   . Elbow fracture, left aug 2012  . Erythropoietin deficiency anemia 06/19/2018  . Fibromyalgia    COUPLE OF TIMES A YEAR-PT HAS EPISODES OF CONFUSION-USUALLY INVOLVES DAY/NIGHT REVERSAL AND EPISODES OF TWITCHING AND FALLS--SEES DR. Jacelyn Grip - NEUROLOGIST-LAST SEEN 07/10/11  . Gastroparesis   . GERD (gastroesophageal reflux disease)   . H/O: GI bleed    from Pradaxa  . Hepatitis    hx of in high school   . HTN (hypertension)    under control; has been on med. x "years"  . Hx MRSA infection   . Hx of gastric ulcer   . Hx of stress fx aug 2011   right hip   . Iron malabsorption 08/14/2017  . LBBB (left bundle branch block)   . Oxygen dependent    2 liters via nasal cannula at all times  . Pacemaker    CRT therapy; followed by Dr. Caryl Comes  . Pleural effusion     s/p right thoracentesis 03/09  . Pneumonia - 03/09, 12/09, 02/10, 06/10   MOST RECENT  FEB 2013  . Primary dilated cardiomyopathy ([facility])    EF 45 to 50% per echo in Jan 2012  . RLS (restless legs syndrome)   . Silent aspiration   . Small bowel obstruction ([place])   . Urinary incontinence     -INTERSTIM IMPLANT NOT FUNCTIONING PER PT  . Venous embolism and thrombosis of subclavian vein ([place])    after pacemaker insertion Oct 2011  . Vitamin B12 deficiency   . Wears glasses    reading    Family History  Problem Relation Age of Onset  . Heart disease Father   . Stroke Mother   . Heart disease Mother   . Heart disease Brother   . Breast cancer Maternal Aunt   . Heart disease Paternal Aunt   . Diabetes Paternal Grandfather   . Colon cancer Neg Hx     Past Surgical History:  Procedure Laterality Date  . APPENDECTOMY    . BRONCHIAL WASHINGS  05/25/2019   Procedure: BRONCHIAL WASHINGS;  Surgeon: Chesley Mires, MD;  Location: [facility];  Service: Endoscopy;;  . CARDIAC CATHETERIZATION  04/08/2006, 11/16/2009  . CHOLECYSTECTOMY N/A 06/27/2017   Procedure: OPEN CHOLECYSTECTOMY;  Surgeon: Ralene Ok, MD;  Location: [facility];  Service: General;  Laterality: N/A;  . COLECTOMY     Intestinal resection (5times)  . COLECTOMY  02/04/2000   ex. lap., intra-abd. subtotal colectomy with ileosigmoid colon anastomosies and lysis of adhesions  . CYSTOSCOPY  11/11/2008  . CYSTOSCOPY W/ RETROGRADES  11/11/2008   right  . CYSTOSCOPY WITH INJECTION  04/30/2006   transurethral collagen injection; incision vaginal stenosis  . ERCP N/A 06/23/2017   Procedure: ENDOSCOPIC RETROGRADE CHOLANGIOPANCREATOGRAPHY (ERCP);  Surgeon: Carol Ada, MD;  Location: [facility];  Service: Endoscopy;  Laterality: N/A;  . ERCP N/A 06/24/2017   Procedure: ENDOSCOPIC RETROGRADE CHOLANGIOPANCREATOGRAPHY (ERCP);  Surgeon: [name], MD;  Location[facility] ENDOSCOPY;  Service: Endoscopy;  Laterality: N/A;  . ESOPHAGOGASTRODUODENOSCOPY N/A 02/02/2013   Procedure: ESOPHAGOGASTRODUODENOSCOPY (EGD);  Surgeon: Lafayette Dragon, MD;  Location: [facility];  Service: Endoscopy;  Laterality: N/A;  . EXPLORATORY LAPAROTOMY  04/27/2009   lysis of adhesions, gastrostomy tube  . GASTROCUTANEOUS FISTULA CLOSURE    . INTERSTIM IMPLANT PLACEMENT  05/28/2006 - stage I  06/05/2006 - stage II  . INTERSTIM IMPLANT PLACEMENT  02/05/2012   Procedure: [name] IMPLANT FIRST STAGE;  Surgeon: Reece Packer, MD;  Location: [facility];  Service: Urology;  Laterality: N/A;  Replacement of Interstim Lead     . INTERSTIM IMPLANT REVISION  03/06/2011   Procedure: REVISION OF [name];  Surgeon: Reece Packer, MD;  Location: [facility];  Service: Urology;  Laterality: N/A;  Replacement of Neurostimulator  . INTERSTIM IMPLANT REVISION  10/23/2007  . IR IMAGING GUIDED PORT INSERTION  10/14/2017  . PACEMAKER PLACEMENT  12/21/2009  . Peg removed     with complications  . PORT-A-CATH REMOVAL  07/27/2011   Procedure: REMOVAL PORT-A-CATH;  Surgeon: Rolm Bookbinder, MD;  Location: [facility];  Service: General;  Laterality: N/A;  . PORTACATH PLACEMENT  12/16/2009  . SAVORY DILATION N/A 02/02/2013   Procedure: SAVORY DILATION;  Surgeon: Lafayette Dragon, MD;  [facility];  Service: Endoscopy;  Laterality: N/A;  . SMALL INTESTINE SURGERY  05/20/2001   ex. lap., resection of small bowel stricture; gastrostomy; insertion central line  . TONSILLECTOMY  age 58  . TOTAL ABDOMINAL HYSTERECTOMY     complete  . TOTAL HIP ARTHROPLASTY  08/03/2011   Procedure: TOTAL HIP ARTHROPLASTY ANTERIOR APPROACH;  Surgeon: Mcarthur Rossetti, MD;  Location: [facility];  Service: Orthopedics;  Laterality: Right;  [name] VIDEO BRONCHOSCOPY N/A 05/25/2019   Procedure: VIDEO BRONCHOSCOPY WITHOUT FLUORO;  Surgeon: Chesley Mires, MD;  [facility];  Service: Endoscopy;  Laterality: N/A;   Social History   Occupational History  . Occupation: disabled    Employer: RETIRED  Tobacco Use  . Smoking status: Former Smoker    Packs/day: 1.00     Years: 20.00    Pack years: 20.00    Quit date: 03/19/1986    Years since quitting: 33.2  . Smokeless tobacco: Never Used  Substance and Sexual Activity  . Alcohol use: No    Alcohol/week: 0.0 standard drinks  . Drug use: No  . Sexual activity: Yes

## 2019-05-27 NOTE — Telephone Encounter (Signed)
At her appointment today at 2:15, will you tell the patient to check her MyChart for the message about her labs from [place]?

## 2019-05-29 ENCOUNTER — Ambulatory Visit (INDEPENDENT_AMBULATORY_CARE_PROVIDER_SITE_OTHER): Payer: HMO | Admitting: *Deleted

## 2019-05-29 ENCOUNTER — Telehealth: Payer: Self-pay | Admitting: Pulmonary Disease

## 2019-05-29 DIAGNOSIS — I428 Other cardiomyopathies: Secondary | ICD-10-CM

## 2019-05-29 NOTE — Telephone Encounter (Signed)
Bronchoscopy 05/25/19 >> 4.095 cells (89%N), cytology negative, culture oral flora.  AFB smear negative.  AFB and fungal cultures pending.   Results d/w pt.  Will update once AFB and fungal cultures finalized.

## 2019-05-30 LAB — CUP PACEART REMOTE DEVICE CHECK
Battery Remaining Longevity: 3 mo
Battery Voltage: 2.76 V
Brady Statistic AP VP Percent: 4.43 %
Brady Statistic AP VS Percent: 0.01 %
Brady Statistic AS VP Percent: 95.52 %
Brady Statistic AS VS Percent: 0.04 %
Brady Statistic RA Percent Paced: 4.37 %
Brady Statistic RV Percent Paced: 99.91 %
Date Time Interrogation Session: 20210312210526
Implantable Lead Implant Date: 20111005
Implantable Lead Implant Date: 20111005
Implantable Lead Implant Date: 20111005
Implantable Lead Location: 753858
Implantable Lead Location: 753859
Implantable Lead Location: 753860
Implantable Lead Model: 4196
Implantable Lead Model: 5076
Implantable Lead Model: 5076
Implantable Pulse Generator Implant Date: 20111005
Lead Channel Impedance Value: 342 Ohm
Lead Channel Impedance Value: 418 Ohm
Lead Channel Impedance Value: 418 Ohm
Lead Channel Impedance Value: 456 Ohm
Lead Channel Impedance Value: 513 Ohm
Lead Channel Impedance Value: 513 Ohm
Lead Channel Impedance Value: 551 Ohm
Lead Channel Impedance Value: 570 Ohm
Lead Channel Impedance Value: 722 Ohm
Lead Channel Pacing Threshold Amplitude: 0.5 V
Lead Channel Pacing Threshold Amplitude: 0.625 V
Lead Channel Pacing Threshold Pulse Width: 0.4 ms
Lead Channel Pacing Threshold Pulse Width: 0.4 ms
Lead Channel Sensing Intrinsic Amplitude: 0.875 mV
Lead Channel Sensing Intrinsic Amplitude: 0.875 mV
Lead Channel Sensing Intrinsic Amplitude: 4.875 mV
Lead Channel Sensing Intrinsic Amplitude: 4.875 mV
Lead Channel Setting Pacing Amplitude: 1.25 V
Lead Channel Setting Pacing Amplitude: 2 V
Lead Channel Setting Pacing Amplitude: 2.5 V
Lead Channel Setting Pacing Pulse Width: 0.4 ms
Lead Channel Setting Pacing Pulse Width: 0.8 ms
Lead Channel Setting Sensing Sensitivity: 0.9 mV

## 2019-05-31 NOTE — Addendum Note (Signed)
Addended by: Chanetta Marshall K on: 05/31/2019 10:34 AM   Modules accepted: Level of Service

## 2019-06-01 ENCOUNTER — Telehealth: Payer: Self-pay | Admitting: Cardiology

## 2019-06-01 ENCOUNTER — Inpatient Hospital Stay: Payer: HMO | Attending: Family | Admitting: Family

## 2019-06-01 ENCOUNTER — Other Ambulatory Visit: Payer: Self-pay | Admitting: Pharmacy Technician

## 2019-06-01 ENCOUNTER — Encounter: Payer: Self-pay | Admitting: Family

## 2019-06-01 ENCOUNTER — Inpatient Hospital Stay: Payer: HMO

## 2019-06-01 ENCOUNTER — Other Ambulatory Visit: Payer: Self-pay

## 2019-06-01 ENCOUNTER — Telehealth: Payer: Self-pay | Admitting: *Deleted

## 2019-06-01 VITALS — BP 141/70 | HR 64 | Temp 96.6°F | Resp 19 | Ht 60.0 in | Wt 87.0 lb

## 2019-06-01 DIAGNOSIS — D500 Iron deficiency anemia secondary to blood loss (chronic): Secondary | ICD-10-CM

## 2019-06-01 DIAGNOSIS — N189 Chronic kidney disease, unspecified: Secondary | ICD-10-CM | POA: Insufficient documentation

## 2019-06-01 DIAGNOSIS — D631 Anemia in chronic kidney disease: Secondary | ICD-10-CM | POA: Insufficient documentation

## 2019-06-01 DIAGNOSIS — I480 Paroxysmal atrial fibrillation: Secondary | ICD-10-CM

## 2019-06-01 DIAGNOSIS — I428 Other cardiomyopathies: Secondary | ICD-10-CM

## 2019-06-01 DIAGNOSIS — D508 Other iron deficiency anemias: Secondary | ICD-10-CM | POA: Diagnosis not present

## 2019-06-01 DIAGNOSIS — K909 Intestinal malabsorption, unspecified: Secondary | ICD-10-CM | POA: Insufficient documentation

## 2019-06-01 DIAGNOSIS — Z9981 Dependence on supplemental oxygen: Secondary | ICD-10-CM | POA: Diagnosis not present

## 2019-06-01 DIAGNOSIS — Z7901 Long term (current) use of anticoagulants: Secondary | ICD-10-CM | POA: Diagnosis not present

## 2019-06-01 DIAGNOSIS — Z01812 Encounter for preprocedural laboratory examination: Secondary | ICD-10-CM

## 2019-06-01 LAB — CMP (CANCER CENTER ONLY)
ALT: 14 U/L (ref 0–44)
AST: 18 U/L (ref 15–41)
Albumin: 4.2 g/dL (ref 3.5–5.0)
Alkaline Phosphatase: 111 U/L (ref 38–126)
Anion gap: 7 (ref 5–15)
BUN: 28 mg/dL — ABNORMAL HIGH (ref 8–23)
CO2: 32 mmol/L (ref 22–32)
Calcium: 8.7 mg/dL — ABNORMAL LOW (ref 8.9–10.3)
Chloride: 98 mmol/L (ref 98–111)
Creatinine: 0.91 mg/dL (ref 0.44–1.00)
GFR, Est AFR Am: >60 mL/min (ref >60)
GFR, Estimated: >60 mL/min (ref >60)
Glucose, Bld: 121 mg/dL — ABNORMAL HIGH (ref 70–99)
Potassium: 4.7 mmol/L (ref 3.5–5.1)
Sodium: 137 mmol/L (ref 135–145)
Total Bilirubin: 0.3 mg/dL (ref 0.3–1.2)
Total Protein: 6.9 g/dL (ref 6.5–8.1)

## 2019-06-01 LAB — RETICULOCYTES
Immature Retic Fract: 4.1 % (ref 2.3–15.9)
RBC.: 4.31 MIL/uL (ref 3.87–5.11)
Retic Count, Absolute: 32.8 10*3/uL (ref 19.0–186.0)
Retic Ct Pct: 0.8 % (ref 0.4–3.1)

## 2019-06-01 LAB — CBC WITH DIFFERENTIAL (CANCER CENTER ONLY)
Abs Immature Granulocytes: 0.12 10*3/uL — ABNORMAL HIGH (ref 0.00–0.07)
Basophils Absolute: 0 10*3/uL (ref 0.0–0.1)
Basophils Relative: 0 %
Eosinophils Absolute: 0 10*3/uL (ref 0.0–0.5)
Eosinophils Relative: 0 %
HCT: 40 % (ref 36.0–46.0)
Hemoglobin: 12.2 g/dL (ref 12.0–15.0)
Immature Granulocytes: 1 %
Lymphocytes Relative: 5 %
Lymphs Abs: 0.5 10*3/uL — ABNORMAL LOW (ref 0.7–4.0)
MCH: 28.2 pg (ref 26.0–34.0)
MCHC: 30.5 g/dL (ref 30.0–36.0)
MCV: 92.6 fL (ref 80.0–100.0)
Monocytes Absolute: 0.5 10*3/uL (ref 0.1–1.0)
Monocytes Relative: 6 %
Neutro Abs: 8.6 10*3/uL — ABNORMAL HIGH (ref 1.7–7.7)
Neutrophils Relative %: 88 %
Platelet Count: 294 10*3/uL (ref 150–400)
RBC: 4.32 MIL/uL (ref 3.87–5.11)
RDW: 14.1 % (ref 11.5–15.5)
WBC Count: 9.8 10*3/uL (ref 4.0–10.5)
nRBC: 0 % (ref 0.0–0.2)

## 2019-06-01 MED ORDER — NITROGLYCERIN 0.4 MG SL SUBL: 0.4000 mg | SUBLINGUAL_TABLET | SUBLINGUAL | 6 refills | Status: AC | PRN

## 2019-06-01 MED ORDER — SODIUM CHLORIDE 0.9% FLUSH
10.0000 mL | INTRAVENOUS | Status: DC | PRN
  Administered 2019-06-01: 10 mL
  Filled 2019-06-01: qty 10

## 2019-06-01 MED ORDER — HEPARIN SOD (PORK) LOCK FLUSH 100 UNIT/ML IV SOLN
500.0000 [IU] | Freq: Once | INTRAVENOUS | Status: AC | PRN
  Administered 2019-06-01: 13:00:00 500 [IU]
  Filled 2019-06-01: qty 5

## 2019-06-01 NOTE — Progress Notes (Signed)
Hematology and Oncology Follow Up Visit  [name] [id number] [birth year] 74 y.o. 06/01/2019   Principle Diagnosis:  Iron deficiency anemia Erythropoietin deficient anemia  Current Therapy:   IV iron as indicated  Retacrit 40,000 units SQfor hgb <11   Interim History:  [name] is here today for follow-up. She is doing quite well and has no complaints at this time.  Hgb is now up to 12.2. No Aranesp needed this visit! She has not noted any blood loss. No bruising or petechiae.  She is on supplemental O2 24 hours a day and states that her SOB is stable.  No fever, chills, n/v, cough, rash, dizziness, palpitations, abdominal pain or changes in bowel or bladder habits.  She states that she had some angina last night that resolved on its own. She is out f nitro tabs and was unable to get through to her cardiologist this AM. She will try to call them again today for refill.  The numbness and tingling in her hands and feet waxes and wanes. No swelling or tenderness in her extremities.   No falls or syncopal episodes to report.  She has maintained a good appetite and is staying well hydrated. Her weight is stable.   ECOG Performance Status: 1 - Symptomatic but completely ambulatory  Medications:  Allergies as of 06/01/2019      Reactions   Dabigatran Etexilate Mesylate Other (See Comments)   INTERNAL BLEEDING-pradaxa   Augmentin [amoxicillin-pot Clavulanate] Diarrhea, Nausea Only   Talwin [pentazocine] Other (See Comments)   hallucinations   Lactate Rash      Medication List       Accurate as of June 01, 2019  1:55 PM. If you have any questions, ask your nurse or doctor.        acetaminophen 650 MG CR tablet Commonly known as: TYLENOL Take 650 mg by mouth every 8 (eight) hours as needed for pain.   albuterol 108 (90 Base) MCG/ACT inhaler Commonly known as: VENTOLIN HFA Inhale 2 puffs into the lungs every 6 (six) hours as needed for wheezing or shortness of  breath.   amLODipine 2.5 MG tablet Commonly known as: NORVASC Take 1 tablet (2.5 mg total) by mouth daily.   apixaban 5 MG Tabs tablet Commonly known as: Eliquis Take 1 tablet (5 mg total) by mouth 2 (two) times daily.   atorvastatin 20 MG tablet Commonly known as: LIPITOR TAKE 1 TABLET BY MOUTH ONCE DAILY AT  6PM What changed:   how much to take  how to take this  when to take this  additional instructions   carvedilol 25 MG tablet Commonly known as: COREG Take 1 tablet (25 mg total) by mouth 2 (two) times daily.   denosumab 60 MG/ML Sosy injection Commonly known as: PROLIA Inject 60 mg into the skin every 6 (six) months.   furosemide 40 MG tablet Commonly known as: LASIX TAKE 1 TABLET BY MOUTH IN THE MORNING What changed: when to take this   gabapentin 600 MG tablet Commonly known as: NEURONTIN Take 1 tablet by mouth 4 times daily   Incruse Ellipta 62.5 MCG/INH Aepb Generic drug: umeclidinium bromide Inhale 1 puff into the lungs daily.   lidocaine-prilocaine cream Commonly known as: EMLA Apply 1 application topically as needed. What changed: reasons to take this   methylPREDNISolone 4 MG tablet Commonly known as: Medrol Medrol dose pack. Take as instructed   nitrofurantoin (macrocrystal-monohydrate) 100 MG capsule Commonly known as: MACROBID Take 100 mg by mouth  daily. UTI prophylaxis prescribed by urology   nitroGLYCERIN 0.4 MG SL tablet Commonly known as: NITROSTAT Place 1 tablet (0.4 mg total) under the tongue every 5 (five) minutes as needed. CHEST PAIN   oxyCODONE-acetaminophen 10-325 MG tablet Commonly known as: PERCOCET Take 1 tablet by mouth every 4 (four) hours as needed. PAIN What changed: reasons to take this   OXYGEN Inhale 3 L into the lungs continuous.   pantoprazole 40 MG tablet Commonly known as: PROTONIX Take 1 tablet by mouth twice daily   promethazine 25 MG tablet Commonly known as: PHENERGAN Take 25 mg by mouth every 6  (six) hours as needed for nausea or vomiting.   tiZANidine 4 MG tablet Commonly known as: ZANAFLEX Take 1 tablet (4 mg total) by mouth 3 (three) times daily as needed. for muscle spams What changed: when to take this   triamcinolone ointment 0.1 % Commonly known as: KENALOG Apply 1 application topically daily as needed (psoriasis).   zolpidem 5 MG tablet Commonly known as: AMBIEN TAKE 1 TABLET BY MOUTH EVERY DAY AT BEDTIME AS NEEDED What changed:   how much to take  how to take this  when to take this  additional instructions       Allergies:  Allergies  Allergen Reactions  . Dabigatran Etexilate Mesylate Other (See Comments)    INTERNAL BLEEDING-pradaxa  . Augmentin [Amoxicillin-Pot Clavulanate] Diarrhea and Nausea Only  . Talwin [Pentazocine] Other (See Comments)    hallucinations  . Lactate Rash    Past Medical History, Surgical history, Social history, and Family History were reviewed and updated.  Review of Systems: All other 10 point review of systems is negative.   Physical Exam:  vitals were not taken for this visit.   Wt Readings from Last 3 Encounters:  05/27/19 91 lb (41.3 kg)  05/25/19 91 lb 0.8 oz (41.3 kg)  05/21/19 90 lb (40.8 kg)    Ocular: Sclerae unicteric, pupils equal, round and reactive to light Ear-nose-throat: Oropharynx clear, dentition fair Lymphatic: No cervical or supraclavicular adenopathy Lungs no rales or rhonchi, good excursion bilaterally Heart regular rate and rhythm, no murmur appreciated Abd soft, nontender, positive bowel sounds, no liver or spleen tip palpated on exam, no fluid wave  MSK no focal spinal tenderness, no joint edema Neuro: non-focal, well-oriented, appropriate affect Breasts: Deferred   Lab Results  Component Value Date   WBC 9.8 06/01/2019   HGB 12.2 06/01/2019   HCT 40.0 06/01/2019   MCV 92.6 06/01/2019   PLT 294 06/01/2019   Lab Results  Component Value Date   FERRITIN 537 (H) 05/11/2019    IRON 54 05/11/2019   TIBC 206 (L) 05/11/2019   UIBC 152 05/11/2019   IRONPCTSAT 26 05/11/2019   Lab Results  Component Value Date   RETICCTPCT 0.8 06/01/2019   RBC 4.32 06/01/2019   RBC 4.31 06/01/2019   RETICCTABS 64.6 04/01/2009   No results found for: KPAFRELGTCHN, LAMBDASER, KAPLAMBRATIO No results found for: IGGSERUM, IGA, IGMSERUM No results found for: Ronnald Ramp, A1GS, A2GS, Violet Baldy, MSPIKE, SPEI   Chemistry      Component Value Date/Time   NA 140 05/11/2019 1040   K 4.5 05/11/2019 1040   CL 103 05/11/2019 1040   CO2 32 05/11/2019 1040   BUN 21 05/11/2019 1040   CREATININE 0.83 05/11/2019 1040   CREATININE 0.91 03/16/2016 1539      Component Value Date/Time   CALCIUM 8.4 (L) 05/11/2019 1040   ALKPHOS 131 (H) 05/11/2019  1040   AST 21 05/11/2019 1040   ALT 21 05/11/2019 1040   BILITOT 0.3 05/11/2019 1040       Impression and Plan: Ms. Rader is a very pleasant 74yo caucasian female with iron deficiency anemia secondary to malabsorption and possible GI blood loss with Eliquis. Hgb stable at 12.2 so no Aranesp needed this visit.  We will see what her iron studies look like ad replace if needed.  We will plan to see her back in another 4 weeks.  She will contact our office with any questions or concerns. We can certainly see her sooner if needed.   Laverna Peace, NP 3/15/20211:55 PM

## 2019-06-01 NOTE — Patient Outreach (Signed)
[place] [facility]) Care Management  06/01/2019  [name] [birth year] [id number]    Care coordination call placed to [employer] in regards to patient's application for Proventil HFA.  Spoke to [place] who informed they have not received the application that was mailed to them on 05/05/2019. He informed to try back in a week or so.  Will follow up with [employer] in 10-14 business days.  [name] P. Alasha Mcguinness, [place]  [phone]

## 2019-06-01 NOTE — Telephone Encounter (Signed)
New message   Patient needs a new prescription called in for nitroGLYCERIN (NITROSTAT) 0.4 MG SL tablet sent to [facility] and [facility].

## 2019-06-01 NOTE — Telephone Encounter (Signed)
Spoke with patient. Advised that as of monthly PPM battery check, patient's device is at [facility] as of 05/04/19. Advised that as Dr. Caryl Comes discussed generator replacement procedure during 05/21/19 telehealth visit, I will forward information to [facility], RN, for assistance scheduling procedure. Pt verbalizes understanding and agreement with plan. No further questions at this time.

## 2019-06-01 NOTE — Telephone Encounter (Signed)
Called patient left message on personal voice mail NTG refill sent to pharmacy.

## 2019-06-01 NOTE — Telephone Encounter (Signed)
noted 

## 2019-06-02 ENCOUNTER — Other Ambulatory Visit: Payer: Self-pay | Admitting: Family Medicine

## 2019-06-02 ENCOUNTER — Telehealth: Payer: Self-pay | Admitting: Pharmacist

## 2019-06-02 DIAGNOSIS — R52 Pain, unspecified: Secondary | ICD-10-CM

## 2019-06-02 LAB — IRON AND TIBC
Iron: 109 ug/dL (ref 41–142)
Saturation Ratios: 44 % (ref 21–57)
TIBC: 246 ug/dL (ref 236–444)
UIBC: 137 ug/dL (ref 120–384)

## 2019-06-02 LAB — FERRITIN: Ferritin: 619 ng/mL — ABNORMAL HIGH (ref 11–307)

## 2019-06-02 MED ORDER — OXYCODONE-ACETAMINOPHEN 10-325 MG PO TABS: 1.0000 | ORAL_TABLET | ORAL | 0 refills | Status: DC | PRN

## 2019-06-02 NOTE — Patient Outreach (Signed)
[place] [facility] [facility]) Care Management  06/02/2019  [name] 05-12-1945 [id number]   Patient was called regarding medication assistance. A message was received from [place] requesting information on a letter the patient received.  HIPAA identifiers were obtained.  Patient read the letter and it was determined the letter was from [employer] requesting more information to see if the patient had additional insurance with [employer]. These letters have traditionally been sent to patients who receive medications through patient assistance.   Patient was walked-through how to complete the form. She will mail it back to [facility] as requested.  Plan: [name] will follow the patient for medication assistance.   Elayne Guerin, PharmD, [place] Clinical Pharmacist [phone]

## 2019-06-02 NOTE — Telephone Encounter (Signed)
Requesting: Percocet Contract: 11/06/18 UDS: 11/06/2018 Last OV: 05/07/19 Next OV: N/A Last Refill: 05/07/19, #120--0 RF Database:   Please advise

## 2019-06-02 NOTE — Addendum Note (Signed)
Addended by: Sanda Linger on: 06/02/2019 09:13 AM   Modules accepted: Orders

## 2019-06-02 NOTE — Telephone Encounter (Signed)
Spoke with pt and advised of appointment for [facility] generator change of Friday, 07/07/2019 with arrival at 730am.  Reviewed instruction letter with pt.  Pt verbalizes understanding of all instructions given and advised a copy will be released to pt's MyChart as well as a hard copy and surgical scrub instructions will be placed for pt pick up the day pt comes for her labs.  Pt agrees with current plan and will notify office if she has any questions.

## 2019-06-02 NOTE — Telephone Encounter (Signed)
Medication: oxyCODONE-acetaminophen (PERCOCET) 10-325 MG tablet   Has the patient contacted their pharmacy? No. (If no, request that the patient contact the pharmacy for the refill.) (If yes, when and what did the pharmacy advise?)  Preferred Pharmacy (with phone number or street name):[facility], [address] [place], [place] [address]  Phone:  [phone] Fax:  [phone]     Agent: Please be advised that RX refills may take up to 3 business days. We ask that you follow-up with your pharmacy.

## 2019-06-03 ENCOUNTER — Other Ambulatory Visit: Payer: Self-pay | Admitting: Pharmacy Technician

## 2019-06-03 NOTE — Patient Outreach (Signed)
[facility]) Care Management  06/03/2019  [name] 08-27-1945 [id number]   Care coordination call placed to [employer] in regards to patient's application for Proventil HFA.  Spoke to [place] who informed patient's application has not been received or processed despite being mailed to them on 05/05/2019. She informed to check back in 7-10 days.  Will follow up with [employer] in 10-14 business days.  Jalynn Betzold P. Rainah Kirshner, [place]  [phone]

## 2019-06-04 ENCOUNTER — Telehealth: Payer: Self-pay | Admitting: Internal Medicine

## 2019-06-04 NOTE — Telephone Encounter (Signed)
The patient is currently scheduled for a PPM gen change on 06/19/19 with Dr. Caryl Comes. She was originally told to arrive at 7:30 am on the day of her procedure. However, as she is a gen change, we needed to move her arrival time back to 10:00 am (procedure to start at 12:00 pm) to allow for a new implant earlier in the day.  I attempted to call the patient. No answer- I left a detailed message on her voice mail (ok per DPR) of the above information, but asked that she please call back to confirm she received this message.

## 2019-06-05 NOTE — Telephone Encounter (Signed)
Spoke with pt and advised of procedure time change.  Pt will need to report to short stay at [place] [facility] instruction letter sent via MyChart and placed hard copy and soap for pt pick up.  Pt also advised per Dr Caryl Comes to hold Eliquis x 2 doses (the night before and the morning of) prior to her 06/19/2019 generator change.  Pt verbalizes understanding and agrees with current plan.

## 2019-06-09 ENCOUNTER — Telehealth: Payer: Self-pay | Admitting: Pulmonary Disease

## 2019-06-09 DIAGNOSIS — A310 Pulmonary mycobacterial infection: Secondary | ICD-10-CM

## 2019-06-09 DIAGNOSIS — J479 Bronchiectasis, uncomplicated: Secondary | ICD-10-CM

## 2019-06-09 NOTE — Telephone Encounter (Signed)
OV with Aaron Edelman has already been canceled. Recall has been placed for 2 month ROV. Nothing further was needed.

## 2019-06-09 NOTE — Telephone Encounter (Signed)
BAL for [employer] from 05/25/19 positive for M avium complex.   Results d/w pt and her husband.  Will arrange for referral to infectious disease.  Advised she can cancel appointment later this week with Wyn Quaker.   Please reschedule appointment in two months.

## 2019-06-11 DIAGNOSIS — J449 Chronic obstructive pulmonary disease, unspecified: Secondary | ICD-10-CM | POA: Diagnosis not present

## 2019-06-11 DIAGNOSIS — I4891 Unspecified atrial fibrillation: Secondary | ICD-10-CM | POA: Diagnosis not present

## 2019-06-12 ENCOUNTER — Ambulatory Visit: Payer: HMO | Admitting: Pulmonary Disease

## 2019-06-12 ENCOUNTER — Other Ambulatory Visit: Payer: Self-pay | Admitting: Family Medicine

## 2019-06-12 DIAGNOSIS — G4700 Insomnia, unspecified: Secondary | ICD-10-CM

## 2019-06-12 NOTE — Telephone Encounter (Signed)
Last zolpidem RX:  05/07/19, #30 Last OV:  05/07/19 Next OV:  Not scheduled UDS: 11/06/18 CSC:  11/06/18

## 2019-06-15 ENCOUNTER — Other Ambulatory Visit: Payer: Self-pay | Admitting: Pharmacy Technician

## 2019-06-15 NOTE — Patient Outreach (Signed)
[place] [facility]) Care Management  06/15/2019  [name] 1945/10/21 [id number]    Care coordination call placed to [employer] in regards to patient's Proventil HFA application.  Spoke to [place] who informed application was received on 06/09/2019 and the attestation form was mailed to the patient on 06/10/2019. He informed once the attestation form is received back then the process can continue.  Will outreach patient with this information.  Destiny Trickey P. Aurea Aronov, [place]  [phone]

## 2019-06-15 NOTE — Patient Outreach (Signed)
[place] [facility]) Care Management  06/15/2019  [name] Feb 03, 1946 [id number]   ADDENDUM  Successful outgoing call placed to patient in regards to [employer] application for Proventil HFA.  Spoke to patient, HIPAA identifiers verified.  Informed patient that the attestation form was mailed back to her with the original application on 0/12/710 per [facility].  Informed patient to outreach me when she has received it and we could discuss it together. Patient verbalized understanding and confirmed having name and number.  Will outreach patient in 10-14 business days to confirm it was received.  [name] P. Mckyla Deckman, [place]  [phone]

## 2019-06-16 ENCOUNTER — Other Ambulatory Visit: Payer: Self-pay

## 2019-06-16 ENCOUNTER — Other Ambulatory Visit: Payer: HMO

## 2019-06-16 ENCOUNTER — Other Ambulatory Visit (HOSPITAL_COMMUNITY)
Admission: RE | Admit: 2019-06-16 | Discharge: 2019-06-16 | Disposition: A | Discharge status: Home / Self Care | Payer: HMO | Source: Ambulatory Visit | Attending: Internal Medicine | Admitting: Internal Medicine

## 2019-06-16 DIAGNOSIS — Z20822 Contact with and (suspected) exposure to covid-19: Secondary | ICD-10-CM | POA: Insufficient documentation

## 2019-06-16 DIAGNOSIS — I428 Other cardiomyopathies: Secondary | ICD-10-CM

## 2019-06-16 DIAGNOSIS — Z01812 Encounter for preprocedural laboratory examination: Secondary | ICD-10-CM | POA: Diagnosis not present

## 2019-06-16 DIAGNOSIS — I480 Paroxysmal atrial fibrillation: Secondary | ICD-10-CM

## 2019-06-16 LAB — BASIC METABOLIC PANEL
BUN/Creatinine Ratio: 27 (ref 12–28)
BUN: 31 mg/dL — ABNORMAL HIGH (ref 8–27)
CO2: 30 mmol/L — ABNORMAL HIGH (ref 20–29)
Calcium: 8.5 mg/dL — ABNORMAL LOW (ref 8.7–10.3)
Chloride: 99 mmol/L (ref 96–106)
Creatinine, Ser: 1.13 mg/dL — ABNORMAL HIGH (ref 0.57–1.00)
GFR calc Af Amer: 55 mL/min/{1.73_m2} — ABNORMAL LOW (ref >59)
GFR calc non Af Amer: 48 mL/min/{1.73_m2} — ABNORMAL LOW (ref >59)
Glucose: 96 mg/dL (ref 65–99)
Potassium: 4 mmol/L (ref 3.5–5.2)
Sodium: 135 mmol/L (ref 134–144)

## 2019-06-16 LAB — CBC
Hematocrit: 37.3 % (ref 34.0–46.6)
Hemoglobin: 12.3 g/dL (ref 11.1–15.9)
MCH: 28.5 pg (ref 26.6–33.0)
MCHC: 33 g/dL (ref 31.5–35.7)
MCV: 87 fL (ref 79–97)
Platelets: 207 10*3/uL (ref 150–450)
RBC: 4.31 x10E6/uL (ref 3.77–5.28)
RDW: 15.4 % (ref 11.7–15.4)
WBC: 7.6 10*3/uL (ref 3.4–10.8)

## 2019-06-16 LAB — SARS CORONAVIRUS 2 (TAT 6-24 HRS): SARS Coronavirus 2: NEGATIVE

## 2019-06-16 NOTE — Telephone Encounter (Signed)
disregard

## 2019-06-17 ENCOUNTER — Telehealth: Payer: Self-pay | Admitting: Pharmacist

## 2019-06-17 DIAGNOSIS — I4891 Unspecified atrial fibrillation: Secondary | ICD-10-CM | POA: Diagnosis not present

## 2019-06-17 DIAGNOSIS — J449 Chronic obstructive pulmonary disease, unspecified: Secondary | ICD-10-CM | POA: Diagnosis not present

## 2019-06-17 LAB — AFB ORGANISM ID BY DNA PROBE
M avium complex: POSITIVE — AB
M tuberculosis complex: NEGATIVE

## 2019-06-17 LAB — MAC SUSCEPTIBILITY BROTH
Clarithromycin: 2
Moxifloxacin: 8
Streptomycin: 32

## 2019-06-17 LAB — ACID FAST CULTURE WITH REFLEXED SENSITIVITIES (MYCOBACTERIA): Acid Fast Culture: POSITIVE — AB

## 2019-06-17 NOTE — Chronic Care Management (AMB) (Signed)
Called patient for HTN follow up. Patient has had multiples appts since last CCM visit. A bronchoscopy was performed on 05/25/19 and was positive for M avium complex. Dr. Halford Chessman referred patient to infectious disease and she is scheduled to see ID on 06/24/2019. Confirmed this appt with patient.  Patient admits that she has not been checking her BP with the multiple appts and procedures. She is scheduled to have her device changed on 06/19/19.   She admits that she has stopped taking her morning dose of carvedilol shortly after the bronchoscopy due to having BP in the 80s/60s. She now has BP in the 140s/70s. She feels better with this BP and would like to continue this regimen.  Informed patient to make sure her cardiologist is aware of this change and to continue monitoring her BP when she can. Reinforced that her BP goal is <140/90. If significantly above this, will need to consider HTN medication regimen.   Will follow up with patient after her ID appt to see what medication changes are to be made.

## 2019-06-19 ENCOUNTER — Other Ambulatory Visit: Payer: Self-pay

## 2019-06-19 ENCOUNTER — Encounter (HOSPITAL_COMMUNITY)
Admission: RE | Disposition: A | Discharge status: Home / Self Care | Payer: Self-pay | Source: Home / Self Care | Attending: Internal Medicine

## 2019-06-19 ENCOUNTER — Ambulatory Visit (HOSPITAL_COMMUNITY)
Admission: RE | Admit: 2019-06-19 | Discharge: 2019-06-19 | Disposition: A | Discharge status: Home / Self Care | Payer: HMO | Attending: Internal Medicine | Admitting: Internal Medicine

## 2019-06-19 DIAGNOSIS — I420 Dilated cardiomyopathy: Secondary | ICD-10-CM | POA: Diagnosis not present

## 2019-06-19 DIAGNOSIS — I2510 Atherosclerotic heart disease of native coronary artery without angina pectoris: Secondary | ICD-10-CM | POA: Diagnosis not present

## 2019-06-19 DIAGNOSIS — Z9981 Dependence on supplemental oxygen: Secondary | ICD-10-CM | POA: Diagnosis not present

## 2019-06-19 DIAGNOSIS — I509 Heart failure, unspecified: Secondary | ICD-10-CM | POA: Insufficient documentation

## 2019-06-19 DIAGNOSIS — M797 Fibromyalgia: Secondary | ICD-10-CM | POA: Insufficient documentation

## 2019-06-19 DIAGNOSIS — R0600 Dyspnea, unspecified: Secondary | ICD-10-CM | POA: Diagnosis not present

## 2019-06-19 DIAGNOSIS — G2581 Restless legs syndrome: Secondary | ICD-10-CM | POA: Diagnosis not present

## 2019-06-19 DIAGNOSIS — Z86718 Personal history of other venous thrombosis and embolism: Secondary | ICD-10-CM | POA: Diagnosis not present

## 2019-06-19 DIAGNOSIS — Z7901 Long term (current) use of anticoagulants: Secondary | ICD-10-CM | POA: Insufficient documentation

## 2019-06-19 DIAGNOSIS — I480 Paroxysmal atrial fibrillation: Secondary | ICD-10-CM | POA: Insufficient documentation

## 2019-06-19 DIAGNOSIS — D649 Anemia, unspecified: Secondary | ICD-10-CM | POA: Insufficient documentation

## 2019-06-19 DIAGNOSIS — K219 Gastro-esophageal reflux disease without esophagitis: Secondary | ICD-10-CM | POA: Diagnosis not present

## 2019-06-19 DIAGNOSIS — I110 Hypertensive heart disease with heart failure: Secondary | ICD-10-CM | POA: Insufficient documentation

## 2019-06-19 DIAGNOSIS — Z45010 Encounter for checking and testing of cardiac pacemaker pulse generator [battery]: Secondary | ICD-10-CM | POA: Diagnosis not present

## 2019-06-19 DIAGNOSIS — Z87891 Personal history of nicotine dependence: Secondary | ICD-10-CM | POA: Insufficient documentation

## 2019-06-19 DIAGNOSIS — K3184 Gastroparesis: Secondary | ICD-10-CM | POA: Insufficient documentation

## 2019-06-19 DIAGNOSIS — Z8673 Personal history of transient ischemic attack (TIA), and cerebral infarction without residual deficits: Secondary | ICD-10-CM | POA: Insufficient documentation

## 2019-06-19 DIAGNOSIS — I429 Cardiomyopathy, unspecified: Secondary | ICD-10-CM | POA: Diagnosis not present

## 2019-06-19 DIAGNOSIS — I447 Left bundle-branch block, unspecified: Secondary | ICD-10-CM | POA: Diagnosis not present

## 2019-06-19 DIAGNOSIS — J449 Chronic obstructive pulmonary disease, unspecified: Secondary | ICD-10-CM | POA: Diagnosis not present

## 2019-06-19 DIAGNOSIS — Z79899 Other long term (current) drug therapy: Secondary | ICD-10-CM | POA: Insufficient documentation

## 2019-06-19 HISTORY — PX: BIV PACEMAKER GENERATOR CHANGEOUT: EP1198

## 2019-06-19 SURGERY — BIV PACEMAKER GENERATOR CHANGEOUT

## 2019-06-19 MED ORDER — CHLORHEXIDINE GLUCONATE 4 % EX LIQD
4.0000 "application " | Freq: Once | CUTANEOUS | Status: DC
  Filled 2019-06-19: qty 60

## 2019-06-19 MED ORDER — ONDANSETRON HCL 4 MG/2ML IJ SOLN: 4.0000 mg | Freq: Four times a day (QID) | INTRAMUSCULAR | Status: DC | PRN

## 2019-06-19 MED ORDER — SODIUM CHLORIDE 0.9 % IV SOLN
INTRAVENOUS | Status: AC
  Filled 2019-06-19: qty 2

## 2019-06-19 MED ORDER — ACETAMINOPHEN 325 MG PO TABS: 325.0000 mg | ORAL_TABLET | ORAL | Status: DC | PRN

## 2019-06-19 MED ORDER — VANCOMYCIN HCL IN DEXTROSE 1-5 GM/200ML-% IV SOLN
1000.0000 mg | INTRAVENOUS | Status: AC
  Administered 2019-06-19: 1000 mg via INTRAVENOUS

## 2019-06-19 MED ORDER — SODIUM CHLORIDE 0.9 % IV SOLN
80.0000 mg | INTRAVENOUS | Status: AC
  Administered 2019-06-19: 80 mg

## 2019-06-19 MED ORDER — HEPARIN SOD (PORK) LOCK FLUSH 100 UNIT/ML IV SOLN
INTRAVENOUS | Status: AC
  Administered 2019-06-19: 500 [IU]
  Filled 2019-06-19: qty 5

## 2019-06-19 MED ORDER — HEPARIN SOD (PORK) LOCK FLUSH 100 UNIT/ML IV SOLN: 500.0000 [IU] | Freq: Once | INTRAVENOUS | Status: DC

## 2019-06-19 MED ORDER — VANCOMYCIN HCL IN DEXTROSE 1-5 GM/200ML-% IV SOLN
INTRAVENOUS | Status: AC
  Filled 2019-06-19: qty 200

## 2019-06-19 MED ORDER — LIDOCAINE HCL 1 % IJ SOLN
INTRAMUSCULAR | Status: AC
  Filled 2019-06-19: qty 60

## 2019-06-19 MED ORDER — SODIUM CHLORIDE 0.9 % IV SOLN: INTRAVENOUS | Status: DC

## 2019-06-19 MED ORDER — LIDOCAINE HCL (PF) 1 % IJ SOLN
INTRAMUSCULAR | Status: DC | PRN
  Administered 2019-06-19: 60 mL

## 2019-06-19 SURGICAL SUPPLY — 7 items
BLANKET WARM UNDERBOD FULL ACC (MISCELLANEOUS) ×2 IMPLANT
CABLE SURGICAL S-101-97-12 (CABLE) ×2 IMPLANT
HEMOSTAT SURGICEL 2X4 FIBR (HEMOSTASIS) ×2 IMPLANT
PACEMAKER PRCT MRI CRTP W1TR01 (Pacemaker) ×1 IMPLANT
PAD PRO RADIOLUCENT 2001M-C (PAD) ×2 IMPLANT
PPM PRECEPTA MRI CRT-P W1TR01 (Pacemaker) ×2 IMPLANT
TRAY PACEMAKER INSERTION (PACKS) ×2 IMPLANT

## 2019-06-19 NOTE — Interval H&P Note (Signed)
History and Physical Interval Note:  06/19/2019 10:30 AM  [name]  has presented today for surgery, with the diagnosis of eri.  The various methods of treatment have been discussed with the patient and family. After consideration of risks, benefits and other options for treatment, the patient has consented to  Procedure(s): [place] (N/A) as a surgical intervention.  The patient's history has been reviewed, patient examined, no change in status, stable for surgery.  I have reviewed the patient's chart and labs.  Questions were answered to the patient's satisfaction.     Virl Axe  BP (!) 150/63   Pulse 64   Temp 97.8 F (36.6 C) (Skin)   Resp 14   Ht 5' (1.524 m)   Wt 40.4 kg   SpO2 100%   BMI 17.38 kg/m  Well developed and nourished in no acute distress HENT normal Neck supple with JVP  Clear Regular rate and rhythm, no murmurs or gallops Abd-soft with active BS No Clubbing cyanosis edema Skin-warm and dry A & Oriented  Grossly normal sensory and motor function

## 2019-06-19 NOTE — Discharge Instructions (Signed)
Wound care instructions Keep incision clean and dry for 10 days. No driving for 2 days.  Dr. Caryl Comes used skin glue, do not remove/peel off Call the office [phone]) for redness, drainage, swelling, or fever.   Implantable Cardiac Device Battery Change, Care After  This sheet gives you information about how to care for yourself after your procedure. Your health care provider may also give you more specific instructions. If you have problems or questions, contact your health care provider. What can I expect after the procedure? After your procedure, it is common to have:  Pain or soreness at the site where the cardiac device was inserted.  Swelling at the site where the cardiac device was inserted.  You should received an information card for your new device in 4-8 weeks. Follow these instructions at home: Incision care   Keep the incision clean and dry. ? Do not take baths, swim, or use a hot tub until after your wound check.  ? Do not shower for at least 7 days, or as directed by your health care provider. ? Pat the area dry with a clean towel. Do not rub the area. This may cause bleeding.  Follow instructions from your health care provider about how to take care of your incision. Make sure you: ? Leave stitches (sutures), skin glue, or adhesive strips in place. These skin closures may need to stay in place for 2 weeks or longer. If adhesive strip edges start to loosen and curl up, you may trim the loose edges. Do not remove adhesive strips completely unless your health care provider tells you to do that.  Check your incision area every day for signs of infection. Check for: ? More redness, swelling, or pain. ? More fluid or blood. ? Warmth. ? Pus or a bad smell. Activity  Do not lift anything that is heavier than 10 lb (4.5 kg) until your health care provider says it is okay to do so.  For the first week, or as long as told by your health care provider: ? Avoid lifting your  affected arm higher than your shoulder. ? After 1 week, Be gentle when you move your arms over your head. It is okay to raise your arm to comb your hair. ? Avoid strenuous exercise.  Ask your health care provider when it is okay to: ? Resume your normal activities. ? Return to work or school. ? Resume sexual activity. Eating and drinking  Eat a heart-healthy diet. This should include plenty of fresh fruits and vegetables, whole grains, low-fat dairy products, and lean protein like chicken and fish.  Limit alcohol intake to no more than 1 drink a day for non-pregnant women and 2 drinks a day for men. One drink equals 12 oz of beer, 5 oz of wine, or 1 oz of hard liquor.  Check ingredients and nutrition facts on packaged foods and beverages. Avoid the following types of food: ? Food that is high in salt (sodium). ? Food that is high in saturated fat, like full-fat dairy or red meat. ? Food that is high in trans fat, like fried food. ? Food and drinks that are high in sugar. Lifestyle  Do not use any products that contain nicotine or tobacco, such as cigarettes and e-cigarettes. If you need help quitting, ask your health care provider.  Take steps to manage and control your weight.  Once cleared, get regular exercise. Aim for 150 minutes of moderate-intensity exercise (such as walking or yoga) or 75  minutes of vigorous exercise (such as running or swimming) each week.  Manage other health problems, such as diabetes or high blood pressure. Ask your health care provider how you can manage these conditions. General instructions  Do not drive for 24 hours after your procedure if you were given a medicine to help you relax (sedative).  Take over-the-counter and prescription medicines only as told by your health care provider.  Avoid putting pressure on the area where the cardiac device was placed.  If you need an MRI after your cardiac device has been placed, be sure to tell the health  care provider who orders the MRI that you have a cardiac device.  Avoid close and prolonged exposure to electrical devices that have strong magnetic fields. These include: ? Cell phones. Avoid keeping them in a pocket near the cardiac device, and try using the ear opposite the cardiac device. ? MP3 players. ? Household appliances, like microwaves. ? Metal detectors. ? Electric generators. ? High-tension wires.  Keep all follow-up visits as directed by your health care provider. This is important. Contact a health care provider if:  You have pain at the incision site that is not relieved by over-the-counter or prescription medicines.  You have any of these around your incision site or coming from it: ? More redness, swelling, or pain. ? Fluid or blood. ? Warmth to the touch. ? Pus or a bad smell.  You have a fever.  You feel brief, occasional palpitations, light-headedness, or any symptoms that you think might be related to your heart. Get help right away if:  You experience chest pain that is different from the pain at the cardiac device site.  You develop a red streak that extends above or below the incision site.  You experience shortness of breath.  You have palpitations or an irregular heartbeat.  You have light-headedness that does not go away quickly.  You faint or have dizzy spells.  Your pulse suddenly drops or increases rapidly and does not return to normal.  You begin to gain weight and your legs and ankles swell. Summary  After your procedure, it is common to have pain, soreness, and some swelling where the cardiac device was inserted.  Make sure to keep your incision clean and dry. Follow instructions from your health care provider about how to take care of your incision.  Check your incision every day for signs of infection, such as more pain or swelling, pus or a bad smell, warmth, or leaking fluid and blood.  Avoid strenuous exercise and lifting your  left arm higher than your shoulder for 2 weeks, or as long as told by your health care provider. This information is not intended to replace advice given to you by your health care provider. Make sure you discuss any questions you have with your health care provider.

## 2019-06-22 MED FILL — Lidocaine HCl Local Inj 1%: INTRAMUSCULAR | Qty: 60 | Status: AC

## 2019-06-23 LAB — FUNGUS CULTURE WITH STAIN

## 2019-06-23 LAB — FUNGUS CULTURE RESULT

## 2019-06-23 LAB — FUNGAL ORGANISM REFLEX

## 2019-06-24 ENCOUNTER — Ambulatory Visit: Payer: HMO | Admitting: Internal Medicine

## 2019-06-24 ENCOUNTER — Encounter: Payer: Self-pay | Admitting: Internal Medicine

## 2019-06-24 ENCOUNTER — Ambulatory Visit: Payer: HMO | Admitting: Orthopaedic Surgery

## 2019-06-24 ENCOUNTER — Other Ambulatory Visit: Payer: Self-pay

## 2019-06-24 DIAGNOSIS — A310 Pulmonary mycobacterial infection: Secondary | ICD-10-CM | POA: Diagnosis not present

## 2019-06-24 MED ORDER — AZITHROMYCIN 500 MG PO TABS: 500.0000 mg | ORAL_TABLET | ORAL | 8 refills | Status: DC

## 2019-06-24 MED ORDER — ETHAMBUTOL HCL 400 MG PO TABS: 800.0000 mg | ORAL_TABLET | ORAL | 8 refills | Status: DC

## 2019-06-24 MED ORDER — ETHAMBUTOL HCL 100 MG PO TABS: 200.0000 mg | ORAL_TABLET | ORAL | 8 refills | Status: DC

## 2019-06-24 MED ORDER — RIFABUTIN 150 MG PO CAPS: 300.0000 mg | ORAL_CAPSULE | ORAL | 8 refills | Status: DC

## 2019-06-24 NOTE — Progress Notes (Signed)
[facility]      Reason for Consult: MAI pulmonary infection    Referring Physician: Dr. Halford Chessman    Patient ID: [name], female    DOB: 21-Jun-1945, 74 y.o.   MRN: [id number]  HPI:    Here for evaluation of pulmonary MAI.  Noted on BAL done by Dr. Halford Chessman done 3/8 and positive for MAI.  Radiographically has had nodules and consolidation and noted since 2019.  She currently is oxygen dependent and on 4L continuously.  Exacerbations about once per year.  No fever or chills.  Here with her husband.  Sensitivities noted.  No significant decline over the last 6-12 months but progressively worsening  Past Medical History:  Diagnosis Date  . Anemia   . Arthritis    right hip  . Atrial fibrillation ([place])   . CAD (coronary artery disease)    mild disease per cath in 2011  . CHF (congestive heart failure) ([place])   . COPD (chronic obstructive pulmonary disease) (HCC)    continuous O2- 2l  . Diverticulitis   . Elbow fracture, left aug 2012  . Erythropoietin deficiency anemia 06/19/2018  . Fibromyalgia    COUPLE OF TIMES A YEAR-PT HAS EPISODES OF CONFUSION-USUALLY INVOLVES DAY/NIGHT REVERSAL AND EPISODES OF TWITCHING AND FALLS--SEES DR. Jacelyn Grip - NEUROLOGIST-LAST SEEN 07/10/11  . Gastroparesis   . GERD (gastroesophageal reflux disease)   . H/O: GI bleed    from Pradaxa  . Hepatitis    hx of in high school   . HTN (hypertension)    under control; has been on med. x "years"  . Hx MRSA infection   . Hx of gastric ulcer   . Hx of stress fx aug 2011   right hip   . Iron malabsorption 08/14/2017  . LBBB (left bundle branch block)   . Oxygen dependent    2 liters via nasal cannula at all times  . Pacemaker    CRT therapy; followed by Dr. Caryl Comes  . Pleural effusion     s/p right thoracentesis 03/09  . Pneumonia - 03/09, 12/09, 02/10, 06/10   MOST RECENT FEB 2013  . Primary dilated cardiomyopathy (HCC)    EF 45 to 50% per echo in Jan 2012  . RLS (restless legs  syndrome)   . Silent aspiration   . Small bowel obstruction ([place])   . Urinary incontinence     -INTERSTIM IMPLANT NOT FUNCTIONING PER PT  . Venous embolism and thrombosis of subclavian vein ([place])    after pacemaker insertion Oct 2011  . Vitamin B12 deficiency   . Wears glasses    reading    Prior to Admission medications   Medication Sig Start Date End Date Taking? Authorizing Provider  acetaminophen (TYLENOL) 650 MG CR tablet Take 650 mg by mouth every 8 (eight) hours as needed for pain.   Yes [provider]  albuterol (VENTOLIN HFA) 108 (90 Base) MCG/ACT inhaler Inhale 2 puffs into the lungs every 6 (six) hours as needed for wheezing or shortness of breath. 09/10/18  Yes Lauraine Rinne, NP  amLODipine (NORVASC) 2.5 MG tablet Take 1 tablet (2.5 mg total) by mouth daily. Patient taking differently: Take 2.5 mg by mouth every evening.  09/05/18  Yes Minus Breeding, MD  apixaban (ELIQUIS) 5 MG TABS tablet Take 1 tablet (5 mg total) by mouth 2 (two) times daily. 08/04/18  Yes Roma Schanz R, DO  atorvastatin (LIPITOR) 20 MG tablet TAKE 1  TABLET BY MOUTH ONCE DAILY AT  6PM Patient taking differently: Take 20 mg by mouth daily at 6 PM.  05/07/19  Yes Lowne Chase, Alferd Apa, DO  carvedilol (COREG) 25 MG tablet Take 1 tablet (25 mg total) by mouth 2 (two) times daily. 03/31/19  Yes Minus Breeding, MD  denosumab (PROLIA) 60 MG/ML SOSY injection Inject 60 mg into the skin every 6 (six) months. 03/25/15  Yes [provider]  furosemide (LASIX) 40 MG tablet TAKE 1 TABLET BY MOUTH IN THE MORNING Patient taking differently: Take 40 mg by mouth daily.  05/01/19  Yes Minus Breeding, MD  gabapentin (NEURONTIN) 600 MG tablet Take 1 tablet by mouth 4 times daily Patient taking differently: Take 600 mg by mouth in the morning, at noon, in the evening, and at bedtime.  05/20/19  Yes Roma Schanz R, DO  lidocaine-prilocaine (EMLA) cream Apply 1 application topically as needed. Patient  taking differently: Apply 1 application topically as needed [facility]).  10/16/17  Yes Volanda Napoleon, MD  methylPREDNISolone (MEDROL) 4 MG tablet Medrol dose pack. Take as instructed 05/27/19  Yes Mcarthur Rossetti, MD  nitrofurantoin (MACRODANTIN) 100 MG capsule Take 100 mg by mouth daily. 06/07/19  Yes [provider]  nitroGLYCERIN (NITROSTAT) 0.4 MG SL tablet Place 1 tablet (0.4 mg total) under the tongue every 5 (five) minutes as needed. CHEST PAIN 06/01/19  Yes Minus Breeding, MD  oxyCODONE-acetaminophen (PERCOCET) 10-325 MG tablet Take 1 tablet by mouth every 4 (four) hours as needed. PAIN 06/02/19  Yes Roma Schanz R, DO  OXYGEN Inhale 3 L into the lungs continuous.   Yes [provider]  pantoprazole (PROTONIX) 40 MG tablet Take 1 tablet by mouth twice daily Patient taking differently: Take 40 mg by mouth 2 (two) times daily.  04/20/19  Yes Gatha Mayer, MD  promethazine (PHENERGAN) 25 MG tablet Take 25 mg by mouth every 6 (six) hours as needed for nausea or vomiting.   Yes [provider]  tiZANidine (ZANAFLEX) 4 MG tablet Take 1 tablet (4 mg total) by mouth 3 (three) times daily as needed. for muscle spams Patient taking differently: Take 4 mg by mouth in the morning and at bedtime. for muscle spams 05/07/19  Yes Roma Schanz R, DO  triamcinolone ointment (KENALOG) 0.1 % Apply 1 application topically daily as needed (psoriasis).  03/19/18  Yes [provider]  umeclidinium bromide (INCRUSE ELLIPTA) 62.5 MCG/INH AEPB Inhale 1 puff into the lungs daily. 09/09/18  Yes Lauraine Rinne, NP  zolpidem (AMBIEN) 5 MG tablet TAKE 1 TABLET BY MOUTH EVERY DAY AT BEDTIME AS NEEDED 06/12/19  Yes Roma Schanz R, DO  iron dextran complex (INFED) 50 MG/ML injection Please give infed infusion (no test dose needed) over 4 hours per pharmacy calculated dose. Ht: 5'2 Wt: 99 pounds Hgb: 12 12/20/10 12/16/18  Lafayette Dragon, MD    Allergies  Allergen  Reactions  . Dabigatran Etexilate Mesylate Other (See Comments)    INTERNAL BLEEDING-pradaxa  . Augmentin [Amoxicillin-Pot Clavulanate] Diarrhea and Nausea Only    Did it involve swelling of the face/tongue/throat, SOB, or low BP? No Did it involve sudden or severe rash/hives, skin peeling, or any reaction on the inside of your mouth or nose? No Did you need to seek medical attention at a hospital or doctor's office? No When did it last happen?02/2019 If all above answers are "NO", may proceed with cephalosporin use.   [name] [Pentazocine] Other (  See Comments)    hallucinations  . Lactate Rash    Social History   Tobacco Use  . Smoking status: Former Smoker    Packs/day: 1.00    Years: 20.00    Pack years: 20.00    Quit date: 03/19/1986    Years since quitting: 33.2  . Smokeless tobacco: Never Used  Substance Use Topics  . Alcohol use: No    Alcohol/week: 0.0 standard drinks  . Drug use: No    Family History  Problem Relation Age of Onset  . Heart disease Father   . Stroke Mother   . Heart disease Mother   . Heart disease Brother   . Breast cancer Maternal Aunt   . Heart disease Paternal Aunt   . Diabetes Paternal Grandfather   . Colon cancer Neg Hx     Review of Systems  Constitutional: negative for fevers and chills Integument/breast: negative for rash Musculoskeletal: negative for myalgias and arthralgias All other systems reviewed and are negative    Constitutional: in no apparent distress  Vitals:   06/24/19 1456  BP: (!) 146/84  Pulse: 75  Temp: 97.6 F (36.4 C)  SpO2: 100%   EYES: anicteric Cardiovascular: Cor RRR Respiratory: distant Skin: negatives: no rash   Labs: Lab Results  Component Value Date   WBC 7.6 06/16/2019   HGB 12.3 06/16/2019   HCT 37.3 06/16/2019   MCV 87 06/16/2019   PLT 207 06/16/2019    Lab Results  Component Value Date   CREATININE 1.13 (H) 06/16/2019   BUN 31 (H) 06/16/2019   NA 135 06/16/2019   K 4.0  06/16/2019   CL 99 06/16/2019   CO2 30 (H) 06/16/2019    Lab Results  Component Value Date   ALT 14 06/01/2019   AST 18 06/01/2019   ALKPHOS 111 06/01/2019   BILITOT 0.3 06/01/2019   INR 0.98 10/14/2017     Assessment: Pulmonary MAI infection.  I discussed treatment options including current medications though rifampin has a significant interaction with Eliquis and she is a poor coumadin candidate with difficult vein access.  I think it is worth treatment and will consider rifabutin with azithromycin and ethambutol. Potentially inhaled amikacin as additional therapy later.    Plan: 1) start 3 times per week Follow up in 4 weeks for evaluation of tolerance

## 2019-06-24 NOTE — Patient Instructions (Addendum)
You will start taking 3 different medications from today's visit:  On Monday, Wednesday, and Friday: - Take azithromycin 500 mg (one tablet)  - Take ethambutol 1000 mg (two 400 mg tablets + two 100 mg tablets)  On Tuesday, Thursday, and Saturday: - Take rifabutin 300 mg (two 150 mg capsules)

## 2019-06-24 NOTE — Progress Notes (Signed)
HPI: [name] is a 74 y.o. female who presents to the [facility] clinic today to initiate treatment for a newly diagnosed MAI infection.  Patient Active Problem List   Diagnosis Date Noted  . MAI (mycobacterium avium-intracellulare) infection ([place]) 06/24/2019  . History of total right hip replacement 05/27/2019  . NICM (nonischemic cardiomyopathy) ([place]) 05/19/2019  . Educated about COVID-19 virus infection 09/07/2018  . Erythropoietin deficiency anemia 06/19/2018  . Swelling of calf 02/04/2018  . Bad odor of urine 12/09/2017  . Iron deficiency anemia due to chronic blood loss 08/14/2017  . Iron malabsorption 08/14/2017  . Hyperlipidemia LDL goal <100 07/08/2017  . Elevated ALT measurement   . Hypotension 06/20/2017  . HLD (hyperlipidemia) 06/20/2017  . CAD (coronary artery disease) 06/20/2017  . Protein-calorie malnutrition, severe ([place]) 06/20/2017  . Generalized weakness   . Weakness 05/30/2017  . Focal neurological deficit 05/30/2017  . Chronic diastolic CHF (congestive heart failure) ([place]) 05/30/2017  . TIA (transient ischemic attack) 05/30/2017  . AKI (acute kidney injury) ([place]) 05/30/2017  . Abnormal LFTs 05/30/2017  . Bronchiectasis ([place]) 04/22/2017  . Right otitis media 07/17/2016  . Cerumen impaction 07/17/2016  . Osteoporosis 03/16/2016  . Headache 11/20/2014  . Skin infection 05/27/2014  . Insomnia 05/27/2014  . Abdominal wall pain 07/09/2013  . Other dysphagia 02/02/2013  . Platelets decreased ([place]) 01/29/2012  . Vitamin D deficiency 12/13/2011  . Hypokalemia 12/13/2011  . Hypocalcemia 12/12/2011  . Adhesions of intestine, very dense, with frozen abdomen 12/11/2011  . SBO (small bowel obstruction), recurrent 12/11/2011  . Protein-calorie malnutrition, moderate ([place]) 12/11/2011  . Oxygen dependent   . Hx of gastric ulcer   . UTI (urinary tract infection) 10/15/2011  . Allergic conjunctivitis 10/15/2011  . Degenerative arthritis of hip 08/03/2011  . B12  deficiency 07/02/2011  . Bilateral leg weakness 01/16/2011  . Anemia, chronic disease 09/14/2010  . Recurrent pneumonia 08/11/2010  . CRT- [employer] 06/26/2010  . DYSURIA 05/19/2010  . Chronic hypoxemic respiratory failure ([place]) 02/21/2010  . DEHYDRATION 01/06/2010  . GASTROENTERITIS 01/06/2010  . Congestive dilated cardiomyopathy ([place]) 11/17/2009  . LBBB 11/17/2009  . SYSTOLIC HEART FAILURE, CHRONIC 11/17/2009  . GERD (gastroesophageal reflux disease) 02/17/2009  . NONSPECIFIC ABN FINDNG RAD&OTH EXAM BILARY TRCT 09/03/2008  . COPD with emphysema ([place]) 06/28/2008  . DIVERTICULOSIS, COLON 06/03/2008  . GASTRITIS 06/02/2008  . Atrial fibrillation/flutter 12/08/2007  . GASTROPARESIS 08/07/2007  . NAUSEA, CHRONIC 08/07/2007  . Abdominal pain 08/07/2007  . SYNCOPE 02/17/2007  . RESTLESS LEG SYNDROME 08/01/2006  . Chronic pain syndrome 08/01/2006  . OSTEOARTHRITIS 07/31/2006  . Degenerative disc disease, lumbar 07/31/2006  . Fibromyalgia 07/31/2006  . CHRONIC FATIGUE SYNDROME 07/31/2006  . URINARY INCONTINENCE 07/31/2006  . ANEMIA-IRON DEFICIENCY 05/01/2006  . Essential hypertension 05/01/2006    Patient's Medications  New Prescriptions   No medications on file  Previous Medications   ACETAMINOPHEN (TYLENOL) 650 MG CR TABLET    Take 650 mg by mouth every 8 (eight) hours as needed for pain.   ALBUTEROL (VENTOLIN HFA) 108 (90 BASE) MCG/ACT INHALER    Inhale 2 puffs into the lungs every 6 (six) hours as needed for wheezing or shortness of breath.   AMLODIPINE (NORVASC) 2.5 MG TABLET    Take 1 tablet (2.5 mg total) by mouth daily.   APIXABAN (ELIQUIS) 5 MG TABS TABLET    Take 1 tablet (5 mg total) by mouth 2 (two) times daily.   ATORVASTATIN (LIPITOR) 20 MG TABLET    TAKE  1 TABLET BY MOUTH ONCE DAILY AT  6PM   CARVEDILOL (COREG) 25 MG TABLET    Take 1 tablet (25 mg total) by mouth 2 (two) times daily.   DENOSUMAB (PROLIA) 60 MG/ML SOSY INJECTION    Inject 60 mg into the skin every 6  (six) months.   FUROSEMIDE (LASIX) 40 MG TABLET    TAKE 1 TABLET BY MOUTH IN THE MORNING   GABAPENTIN (NEURONTIN) 600 MG TABLET    Take 1 tablet by mouth 4 times daily   LIDOCAINE-PRILOCAINE (EMLA) CREAM    Apply 1 application topically as needed.   METHYLPREDNISOLONE (MEDROL) 4 MG TABLET    Medrol dose pack. Take as instructed   NITROFURANTOIN (MACRODANTIN) 100 MG CAPSULE    Take 100 mg by mouth daily.   NITROGLYCERIN (NITROSTAT) 0.4 MG SL TABLET    Place 1 tablet (0.4 mg total) under the tongue every 5 (five) minutes as needed. CHEST PAIN   OXYCODONE-ACETAMINOPHEN (PERCOCET) 10-325 MG TABLET    Take 1 tablet by mouth every 4 (four) hours as needed. PAIN   OXYGEN    Inhale 3 L into the lungs continuous.   PANTOPRAZOLE (PROTONIX) 40 MG TABLET    Take 1 tablet by mouth twice daily   PROMETHAZINE (PHENERGAN) 25 MG TABLET    Take 25 mg by mouth every 6 (six) hours as needed for nausea or vomiting.   TIZANIDINE (ZANAFLEX) 4 MG TABLET    Take 1 tablet (4 mg total) by mouth 3 (three) times daily as needed. for muscle spams   TRIAMCINOLONE OINTMENT (KENALOG) 0.1 %    Apply 1 application topically daily as needed (psoriasis).    UMECLIDINIUM BROMIDE (INCRUSE ELLIPTA) 62.5 MCG/INH AEPB    Inhale 1 puff into the lungs daily.   ZOLPIDEM (AMBIEN) 5 MG TABLET    TAKE 1 TABLET BY MOUTH EVERY DAY AT BEDTIME AS NEEDED  Modified Medications   No medications on file  Discontinued Medications   No medications on file    Allergies: Allergies  Allergen Reactions  . Dabigatran Etexilate Mesylate Other (See Comments)    INTERNAL BLEEDING-pradaxa  . Augmentin [Amoxicillin-Pot Clavulanate] Diarrhea and Nausea Only    Did it involve swelling of the face/tongue/throat, SOB, or low BP? No Did it involve sudden or severe rash/hives, skin peeling, or any reaction on the inside of your mouth or nose? No Did you need to seek medical attention at a hospital or doctor's office? No When did it last happen?02/2019 If  all above answers are "NO", may proceed with cephalosporin use.   [name] [Pentazocine] Other (See Comments)    hallucinations  . Lactate Rash    Past Medical History: Past Medical History:  Diagnosis Date  . Anemia   . Arthritis    right hip  . Atrial fibrillation ([place])   . CAD (coronary artery disease)    mild disease per cath in 2011  . CHF (congestive heart failure) ([place])   . COPD (chronic obstructive pulmonary disease) ([facility])    continuous O2- 2l  . Diverticulitis   . Elbow fracture, left aug 2012  . Erythropoietin deficiency anemia 06/19/2018  . Fibromyalgia    COUPLE OF TIMES A YEAR-PT HAS EPISODES OF CONFUSION-USUALLY INVOLVES DAY/NIGHT REVERSAL AND EPISODES OF TWITCHING AND FALLS--SEES DR. Jacelyn Grip - NEUROLOGIST-LAST SEEN 07/10/11  . Gastroparesis   . GERD (gastroesophageal reflux disease)   . H/O: GI bleed    from Pradaxa  . Hepatitis    hx of  in high school   . HTN (hypertension)    under control; has been on med. x "years"  . Hx MRSA infection   . Hx of gastric ulcer   . Hx of stress fx aug 2011   right hip   . Iron malabsorption 08/14/2017  . LBBB (left bundle branch block)   . Oxygen dependent    2 liters via nasal cannula at all times  . Pacemaker    CRT therapy; followed by Dr. Caryl Comes  . Pleural effusion     s/p right thoracentesis 03/09  . Pneumonia - 03/09, 12/09, 02/10, 06/10   MOST RECENT FEB 2013  . Primary dilated cardiomyopathy (HCC)    EF 45 to 50% per echo in Jan 2012  . RLS (restless legs syndrome)   . Silent aspiration   . Small bowel obstruction ([place])   . Urinary incontinence     -INTERSTIM IMPLANT NOT FUNCTIONING PER PT  . Venous embolism and thrombosis of subclavian vein ([place])    after pacemaker insertion Oct 2011  . Vitamin B12 deficiency   . Wears glasses    reading    Social History: Social History   Socioeconomic History  . Marital status: Married    Spouse name: Not on file  . Number of children: 5  . Years of education: Not  on file  . Highest education level: Not on file  Occupational History  . Occupation: disabled    Employer: RETIRED  Tobacco Use  . Smoking status: Former Smoker    Packs/day: 1.00    Years: 20.00    Pack years: 20.00    Quit date: 03/19/1986    Years since quitting: 33.2  . Smokeless tobacco: Never Used  Substance and Sexual Activity  . Alcohol use: No    Alcohol/week: 0.0 standard drinks  . Drug use: No  . Sexual activity: Yes  Other Topics Concern  . Not on file  Social History Narrative   Occupation: Disabled   Married - second time - biologic daughter (in [place]) from first marriage - adopted 4 3 alive with second marriage   Alcohol Use - no   Illicit Drug Use - no   Patient is a former smoker, quit 1988 x51yrs, <1ppd.    Daily Caffeine Use   01/20/2015 - updated   Social Determinants of Health   Financial Resource Strain:   . Difficulty of Paying Living Expenses:   Food Insecurity: No Food Insecurity  . Worried About Charity fundraiser in the Last Year: Never true  . Ran Out of Food in the Last Year: Never true  Transportation Needs: No Transportation Needs  . Lack of Transportation (Medical): No  . Lack of Transportation (Non-Medical): No  Physical Activity:   . Days of Exercise per Week:   . Minutes of Exercise per Session:   Stress:   . Feeling of Stress :   Social Connections:   . Frequency of Communication with Friends and Family:   . Frequency of Social Gatherings with Friends and Family:   . Attends Religious Services:   . Active Member of Clubs or Organizations:   . Attends Archivist Meetings:   [name] Marital Status:    Assessment: [name] is here today for newly diagnosed MAI infection. After reviewing her medication list and assessing the drug interaction between rifampin and her home Eliquis for atrial fibrillation, the decision was made to start this patient on combination therapy with rifabutin 300 mg PO  thrice weekly, azithromycin 500 mg PO  thrice weekly, and ethambutol 1000 mg PO thrice weekly. This regimen consists of: - rifabutin 2 x 150 mg capsules on Tuesdays, Thursdays, and Saturdays - ethambutol 2 x 400 mg tablets + 2 x 100 mg tablets on Mondays, Wednesdays, Fridays - azithromycin 1 x 500 mg tablet on Mondays, Wednesdays, Fridays  The patient was educated to take these medications with her largest meal of the day - at supper. The most common adverse effects of nausea, vomiting, diarrhea, headache, red-colored urine, and vision changes were described. She was advised to contact the [facility] clinic if she has blurred vision, intolerable adverse effects, or persistent vomiting/diarrhea causing dehydration.   Plan: - Start rifabutin, ethambutol, and azithromycin as described above - Patient will come in for follow-up appointment with Dr. Linus Salmons in 4 weeks - Will monitor LFTs and tolerability  Agnes Lawrence, PharmD PGY1 Pharmacy Resident [facility]ious Disease 06/24/2019, 3:57 PM

## 2019-06-25 ENCOUNTER — Other Ambulatory Visit: Payer: Self-pay | Admitting: Pharmacy Technician

## 2019-06-25 NOTE — Patient Outreach (Addendum)
[facility]) Care Management  06/25/2019  [name] [birth year] [id number]   Successful call placed to patient regarding patient assistance receipt of attestation form from [facility] for Proventil HFA, HIPAA identifiers verified.   Patient informed she thought they had sent in the form but she is no  100% sure. She inquired if [employer] would be willing to mail them another attestation form.  Follow up:  Will follow up with [employer] in 2-3 days to inquire if attestation form has been received and if not if a duplicate could be mailed to them.  Cortavius Montesinos P. Tylin Stradley, [place]  [phone]

## 2019-06-26 ENCOUNTER — Other Ambulatory Visit: Payer: Self-pay | Admitting: Pharmacy Technician

## 2019-06-26 NOTE — Patient Outreach (Signed)
[facility]) Care Management  06/26/2019  [name] [birth year] [id number]  Care coordination call placed to [employer] in regards to patient's Proventil HFA application.  Spoke to [place] and informed that patient has not received the attestation form that was mailed to her on 06/10/2019. Mateo Flow was able to get approval to have another attestation form mailed to the patient. She informed that attestation letter would be mailed out on June 29, 2019.  Will follow up with patient in 5-7 business days to confirm the attestation form was received.  Dozier Berkovich P. Desman Polak, [place]  [phone]

## 2019-06-30 ENCOUNTER — Telehealth: Payer: Self-pay | Admitting: Emergency Medicine

## 2019-06-30 ENCOUNTER — Other Ambulatory Visit: Payer: Self-pay

## 2019-06-30 ENCOUNTER — Ambulatory Visit (INDEPENDENT_AMBULATORY_CARE_PROVIDER_SITE_OTHER): Payer: HMO | Admitting: *Deleted

## 2019-06-30 DIAGNOSIS — I429 Cardiomyopathy, unspecified: Secondary | ICD-10-CM

## 2019-06-30 DIAGNOSIS — I428 Other cardiomyopathies: Secondary | ICD-10-CM | POA: Diagnosis not present

## 2019-06-30 LAB — CUP PACEART INCLINIC DEVICE CHECK
Battery Remaining Longevity: 121 mo
Battery Voltage: 3.21 V
Brady Statistic AP VP Percent: 0.2 %
Brady Statistic AP VS Percent: 0 %
Brady Statistic AS VP Percent: 99.7 %
Brady Statistic AS VS Percent: 0.1 %
Brady Statistic RA Percent Paced: 0.22 %
Brady Statistic RV Percent Paced: 99.9 %
Date Time Interrogation Session: 20210413101300
Implantable Lead Implant Date: 20111005
Implantable Lead Implant Date: 20111005
Implantable Lead Implant Date: 20111005
Implantable Lead Location: 753858
Implantable Lead Location: 753859
Implantable Lead Location: 753860
Implantable Lead Model: 4196
Implantable Lead Model: 5076
Implantable Lead Model: 5076
Implantable Pulse Generator Implant Date: 20210402
Lead Channel Impedance Value: 285 Ohm
Lead Channel Impedance Value: 323 Ohm
Lead Channel Impedance Value: 380 Ohm
Lead Channel Impedance Value: 399 Ohm
Lead Channel Impedance Value: 399 Ohm
Lead Channel Impedance Value: 418 Ohm
Lead Channel Impedance Value: 456 Ohm
Lead Channel Impedance Value: 494 Ohm
Lead Channel Impedance Value: 646 Ohm
Lead Channel Pacing Threshold Amplitude: 0.5 V
Lead Channel Pacing Threshold Amplitude: 0.5 V
Lead Channel Pacing Threshold Amplitude: 0.75 V
Lead Channel Pacing Threshold Pulse Width: 0.4 ms
Lead Channel Pacing Threshold Pulse Width: 0.4 ms
Lead Channel Pacing Threshold Pulse Width: 0.8 ms
Lead Channel Sensing Intrinsic Amplitude: 1 mV
Lead Channel Sensing Intrinsic Amplitude: 7.5 mV
Lead Channel Setting Pacing Amplitude: 1.25 V
Lead Channel Setting Pacing Amplitude: 2 V
Lead Channel Setting Pacing Amplitude: 2.5 V
Lead Channel Setting Pacing Pulse Width: 0.4 ms
Lead Channel Setting Pacing Pulse Width: 0.8 ms
Lead Channel Setting Sensing Sensitivity: 0.9 mV

## 2019-06-30 NOTE — Telephone Encounter (Signed)
Per Dr Caryl Comes patient instructed to hold Eliquis. Restart Eliquis  Friday morning.

## 2019-06-30 NOTE — Patient Instructions (Signed)
Call office for any increased swelling, or any drainage, bleeding or redness at wound site. Hold Eliquis until you receive a call from the device clinic to resume Eliquis. Keep pocket pall in place unless showering or bathing.

## 2019-06-30 NOTE — Progress Notes (Signed)
Wound check appointment. Steri-strips removed. Wound without redness. Edema present, hematoma soft. Bruising present at incision site that extends along rib cage to left axilla region. Incision edges approximated, wound well healed. Pocket Pal placed ,+ Eliquis, placed on hold until Dr Caryl Comes consulted. Normal device function. Thresholds, sensing, and impedances consistent with implant measurements. Device programmed at 3.5V/auto capture programmed on for extra safety margin until 3 month visit.FFRWs present , blanking programmed to partial +. Histogram distribution appropriate for patient and level of activity. No mode switches or high ventricular rates noted. Patient educated about wound care, arm mobility, lifting restrictions. ROV in 1 week for wound check. Follow up with Dr Caryl Comes  09/22/19. Next remote due on 09/18/19 .

## 2019-07-01 ENCOUNTER — Other Ambulatory Visit: Payer: Self-pay

## 2019-07-01 ENCOUNTER — Ambulatory Visit: Payer: HMO | Admitting: Pharmacist

## 2019-07-01 DIAGNOSIS — R52 Pain, unspecified: Secondary | ICD-10-CM

## 2019-07-01 NOTE — Telephone Encounter (Signed)
Patient called in to see if DR. Lowne could send in a prescription for  oxyCODONE-acetaminophen (PERCOCET) 10-325 MG tablet [[id number]]   Please send it to [facility], [address]  [place], [place] [address]  Phone:  [phone] Fax:  [phone]  DEA #:  --

## 2019-07-01 NOTE — Chronic Care Management (AMB) (Signed)
Chronic Care Management Pharmacy  Name: [name]  MRN: [id number] DOB: Aug 05, 1945   Chief Complaint/ HPI  [name],  74 y.o. , female presents for their Follow-Up CCM visit with the clinical pharmacist via telephone visit.  PCP : Ann Held, DO  Their chronic conditions include: Afib, COPD, HF, HTN, HLD, CAD, Chronic Pain Syndrome, GERD, Insomnia  Office Visits: 05/07/19: Visit w/ Dr. Etter Sjogren - Pain med refill. No med changes noted. Labs ordered but not drawn (cmp, lipid)  05/04/19: DEXA Scan ordered by Dr. Etter Sjogren  Consult Visit: 06/24/19:  ethambutol 437m #2 M/W/F 1022m#2 M/W/F Azithromycin 5003m tab three times weekly M/WF Rifabutin 150m37m tabs T/Th/Sa  06/01/19: Hem/Onc visit w/ SaraLaverna Peace - Hgb stable. RTC in 4 weeks.  05/27/19: Ortho visit w/ Dr. BlacNinfa Lindenain in right hip. Steroid injection given plus 6 Suleika Donavan steroid taper.  05/22/19: Ortho visit w/ Dr. HiltJunius Roadso med changes noted. Labs ordered.   05/21/19: Cardio visit w/ Dr. KleiCaryl Comeso med changes noted. Defer to [facility] Afib diagnosis and indication for Eliquis   05/15/19: Pulmonary visit w/ BriaWyn Quaker - Pt to complete bronchoscopy to obtain better sputum samples  05/11/19: Hem/Onc visit w/ SaraLaverna Peace - Pt received Retacrit for Hgb 9.9  05/05/19: Patient Assistance with JillSharee Pimplecox, CPhT - Proventil PAP completed and mailed to [employer]    Medications: No facility-administered encounter medications on file as of 07/01/2019.   Outpatient Encounter Medications as of 07/01/2019  Medication Sig Note  . acetaminophen (TYLENOL) 650 MG CR tablet Take 650 mg by mouth every 8 (eight) hours as needed for pain.   . amMarland KitchenODipine (NORVASC) 2.5 MG tablet Take 1 tablet (2.5 mg total) by mouth daily.   . apMarland Kitchenxaban (ELIQUIS) 5 MG TABS tablet Take 1 tablet (5 mg total) by mouth 2 (two) times daily. 07/15/2019: Per the patient, this has been on hold since approx a week ago  . atorvastatin  (LIPITOR) 20 MG tablet TAKE 1 TABLET BY MOUTH ONCE DAILY AT  6PM (Patient taking differently: Take 20 mg by mouth every evening. )   . azithromycin (ZITHROMAX) 500 MG tablet Take 1 tablet (500 mg total) by mouth 3 (three) times a week. (Patient taking differently: Take 500 mg by mouth See admin instructions. Take 500 mg by mouth on Tues/Thurs/Sat)   . carvedilol (COREG) 25 MG tablet Take 1 tablet (25 mg total) by mouth 2 (two) times daily. (Patient taking differently: Take 25 mg by mouth at bedtime. )   . denosumab (PROLIA) 60 MG/ML SOSY injection Inject 60 mg into the skin every 6 (six) months. 07/15/2019: Not yet re-started, per the patient  . ethambutol (MYAMBUTOL) 100 MG tablet Take 2 tablets (200 mg total) by mouth 3 (three) times a week. Take with 2 tablets of 400 mg of ethambutol to equal 1000 mg total. (Patient taking differently: Take 200 mg by mouth every Monday, Wednesday, and Friday. ) 07/15/2019: Patient takes with 2 tablets of 400 mg of ethambutol to equal a total dose of 1,000 mg  . ethambutol (MYAMBUTOL) 400 MG tablet Take 2 tablets (800 mg total) by mouth 3 (three) times a week. Take with 2 tablets of 100 mg of ethambutol to equal 1000 mg total. (Patient taking differently: Take 800 mg by mouth every Monday, Wednesday, and Friday. ) 07/15/2019: Patient takes with 2 tablets of 100 mg of ethambutol to equal a total dose of  1,000 mg  . furosemide (LASIX) 40 MG tablet TAKE 1 TABLET BY MOUTH IN THE MORNING (Patient taking differently: Take 40 mg by mouth in the morning. )   . gabapentin (NEURONTIN) 600 MG tablet Take 1 tablet by mouth 4 times daily (Patient taking differently: Take 600 mg by mouth 4 (four) times daily. )   . lidocaine-prilocaine (EMLA) cream Apply 1 application topically as needed. (Patient taking differently: Apply 1 application topically as needed (for port access- to numb). )   . nitroGLYCERIN (NITROSTAT) 0.4 MG SL tablet Place 1 tablet (0.4 mg total) under the tongue every 5  (five) minutes as needed. CHEST PAIN (Patient taking differently: Place 0.4 mg under the tongue every 5 (five) minutes as needed for chest pain. )   . OXYGEN Inhale 3 L/min into the lungs continuous.    . pantoprazole (PROTONIX) 40 MG tablet Take 1 tablet by mouth twice daily (Patient taking differently: Take 40 mg by mouth 2 (two) times daily before a meal. )   . promethazine (PHENERGAN) 25 MG tablet Take 25 mg by mouth every 6 (six) hours as needed for nausea or vomiting.   . rifabutin (MYCOBUTIN) 150 MG capsule Take 2 capsules (300 mg total) by mouth 3 (three) times a week. (Patient taking differently: Take 300 mg by mouth every Monday, Wednesday, and Friday. )   . tiZANidine (ZANAFLEX) 4 MG tablet Take 1 tablet (4 mg total) by mouth 3 (three) times daily as needed. for muscle spams (Patient taking differently: Take 4 mg by mouth 3 (three) times daily as needed for muscle spasms. )   . triamcinolone ointment (KENALOG) 0.1 % Apply 1 application topically daily as needed (to affected areas- for psoriasis).    [name] umeclidinium bromide (INCRUSE ELLIPTA) 62.5 MCG/INH AEPB Inhale 1 puff into the lungs daily. 05/04/2019: Gets through [place] PAP  . [DISCONTINUED] albuterol (VENTOLIN HFA) 108 (90 Base) MCG/ACT inhaler Inhale 2 puffs into the lungs every 6 (six) hours as needed for wheezing or shortness of breath. (Patient not taking: Reported on 07/15/2019)   . [DISCONTINUED] iron dextran complex (INFED) 50 MG/ML injection Please give infed infusion (no test dose needed) over 4 hours per pharmacy calculated dose. Ht: 5'2 Wt: 99 pounds Hgb: 12   . [DISCONTINUED] methylPREDNISolone (MEDROL) 4 MG tablet Medrol dose pack. Take as instructed (Patient not taking: Reported on 07/15/2019)   . [DISCONTINUED] nitrofurantoin (MACRODANTIN) 100 MG capsule Take 100 mg by mouth daily.   . [DISCONTINUED] oxyCODONE-acetaminophen (PERCOCET) 10-325 MG tablet Take 1 tablet by mouth every 4 (four) hours as needed. PAIN   .  [DISCONTINUED] zolpidem (AMBIEN) 5 MG tablet TAKE 1 TABLET BY MOUTH EVERY Asiah Befort AT BEDTIME AS NEEDED    Immunization History  Administered Date(s) Administered  . DTaP 11/03/1996, 01/04/1997, 05/03/1997, 10/19/1997, 08/05/2001  . Fluad Quad(high Dose 65+) 11/06/2018  . HPV Quadrivalent 09/05/2009, 09/13/2010, 09/26/2011  . Hepatitis A, Ped/Adol-2 Dose 09/21/2004, 03/29/2005  . Hepatitis B, ped/adol 04/30/1996, 11/03/1996, 10/19/1997  . HiB (PRP-T) 11/03/1996, 01/04/1997, 05/03/1997, 10/19/1997  . IPV 11/03/1996, 01/04/1997, 05/03/1997, 08/05/2001  . Influenza Split 01/16/2011, 12/12/2011  . Influenza Whole 01/15/2007, 12/08/2007, 12/28/2008, 12/17/2009  . Influenza, High Dose Seasonal PF 11/28/2015, 12/07/2016, 11/15/2017  . Influenza, Seasonal, Injecte, Preservative Fre 12/09/2012  . Influenza,Quad,Nasal, Live 03/17/2007, 03/06/2009, 01/08/2010, 01/30/2011, 02/09/2012, 01/18/2013  . Influenza,inj,Quad PF,6+ Mos 03/29/2005, 04/30/2005, 03/01/2006, 01/28/2008, 12/01/2013, 01/16/2014, 12/22/2014, 12/28/2014  . MMR 05/03/1997, 08/05/2001  . Meningococcal B, OMV 12/22/2014  . Pneumococcal Conjugate-13 06/10/2014, 11/15/2014  .  Pneumococcal Polysaccharide-23 12/17/2009  . Tdap 09/17/2007, 05/15/2014, 12/09/2018  . Varicella 11/16/1997, 05/18/2008  . Zoster 12/31/2013    Current Diagnosis/Assessment:  Goals Addressed            This Visit's Progress   . Complete Bone Density Scan (DEXA)   On track    -DEXA ordered by Dr. Etter Sjogren on 05/04/19 -Last reported DEXA from 03/17/2015 showed osteoporosis with recommendation to repeat in 2 years    . Pharmacy Care Plan       Current Barriers:  . Chronic Disease Management support, education, and care coordination needs related to Afib, COPD, HF, HTN, HLD, CAD, Chronic Pain Syndrome, GERD, Insomnia  Pharmacist Clinical Goal(s):  [name] Hypertension o Over the next 90 days patient's blood pressure will be less than 140/90 while not having hypotension  or blood pressure less than 90/60 . Osteoporosis o Over the next 180 days patient will reduce risk of osteoporotic fracture by re-initiating Prolia . Health Maintenance  o Over the next 180 days patient will complete Shingrix vaccine series o Over the next 180 days patient will complete COVID vaccine series if agreeable  Interventions: . Comprehensive medication review performed. . Check blood pressure 2-3 times per week . Get DEXA Scan ordered (completed by Dr. Etter Sjogren)  Patient Self Care Activities:  . Patient verbalizes understanding of plan to follow as described above, Self administers medications as prescribed, Calls pharmacy for medication refills, and Calls provider office for new concerns or questions . Hypertension o Over the next 90 days patient will check blood pressure 2-3 times per week and record readings . Osteoporosis o Over the next 180 days patient will get DEXA Scan o Over the next 180 days patient will patient will restart Prolia injections for osteoporosis . Health Maintenance  o Over the next 180 days patient will complete Shingrix vaccine series o Over the next 180 days patient will complete COVID vaccine series if agreeable  Please see past updates related to this goal by clicking on the "Past Updates" button in the selected goal        Social Hx Husband. 5 children (4 adopted (1 deceased), 1 biological) 2 god-children. 3 grandchildren and oldest Daughter lives in [place], [place]. Brother lives in [place], [place].   Gets her hair done every Friday.   PAP Meds = ~ 1 month supply remaining  Morning meds in ziploc bag PM meds in ziploc bag PRN meds on kitchen counter Takes morning medicine between 7-8am Takes night medicine around 9-10pm   AFIB   Patient is currently controlled via pacemaker.  Patient has failed these meds in past: amiodarone 228m Patient is currently controlled on the following medications: apixaban 529mBID  Not currently taking eliquis  due to concerns with bruising.  Told on 06/30/19 to D/C and restart on 4/16.  Had to go through pacemaker replacement without anesthesia due to concern with lungs.  Plan -Continue current medications  COPD / MAI / Tobacco   Last spirometry score:  10/10/2011-pulmonary function test-FVC 1.47 (54% predicted), postbronchodilator ratio 64, postbronchodilator FEV1 1.15 (59% predicted), positive bronchodilator response, DLCO 5.3 (35% predicted)  Gold Grade: Gold 2 (FEV1 50-79%) Current COPD Classification:  B (high sx, <2 exacerbations/yr)  Eosinophil count:   Lab Results  Component Value Date/Time   EOSPCT 0 07/15/2019 07:28 PM   EOSPCT 1.6 04/01/2009 10:48 AM   EOSPCT 0.8 05/01/2007 08:45 AM  %  Eos (Absolute):  Lab Results  Component Value Date/Time   EOSABS 0.0 07/15/2019 07:28 PM   EOSABS 0.1 04/01/2009 10:48 AM    Tobacco Status:  Social History   Tobacco Use  Smoking Status Former Smoker  . Packs/Ovetta Bazzano: 1.00  . Years: 20.00  . Pack years: 20.00  . Quit date: 03/19/1986  . Years since quitting: 33.3  Smokeless Tobacco Never Used    Patient has failed these meds in past: None noted (Spiriva changed to [employer]) Patient is currently controlled on the following medications: incruse ellipta 62.46m/inh once daily, proventil  Using maintenance inhaler regularly? Yes Frequency of rescue inhaler use:  daily after supper  Pt reports that her lung CT showed bacteria in lungs. Has televisit with Dr. SHalford Chessmanto come up with plan  Update Will take antibiotics for 1.5 years to 2 years for MAI Has pill box for regimen outlined Met with pharmacist in ID office   Plan -Continue current medications  Heart Failure   Type: Diastolic  Last ejection fraction: 06/01/2017 55-60% NYHA Class: I (no activity limitation) AHA HF Stage: A (HF risk factors present)  Patient has failed these meds in past: None noted Patient is currently controlled on  the following medications: furosemide 419mdaily   Pt weighs daily Wt on 05/03/19 - 90.6  We discussed weighing daily; if you gain more than 3 pounds in one Tiwanna Tuch or 5 pounds in one week call your doctor  Plan -Continue current medications  Hypertension   BP today is:  Unable to assess due to phone visit  Office blood pressures are  BP Readings from Last 3 Encounters:  07/16/19 (!) 124/59  07/14/19 (!) 108/48  07/09/19 (!) 112/58    Patient has failed these meds in the past: None noted  Patient is currently uncontrolled on the following medications: amlodipine 2.64m10maily PM, carvedilol 264m70mD  Patient checks BP at home infrequently   Denies dizziness, lightheadedness  Update Currently taking 1 tab at night and 1/2 tab in the morning Has not been checking BP at home Wants to reduce BP medications Dr. [facility]  We discussed: How her BP may have been lower previously, but is now elevated  Plan -Check blood pressure 2-3 times a week at home to compare readings to clinic BP -Continue current medications     Osteoporosis   03/17/15: DEXA = Osteoporosis T-Scores Lumbar spine: -4.3 R femoral neck: -3.2 L femoral neck: -3.3  Patient has failed these meds in past: None noted Patient is currently uncontrolled on the following medications: Prolia 60mg34mmonths  Pt reports she has not had injection in over a year, but is interested in restarting therapy  Plan -Complete DEXA scan -Restart Prolia Injections  Miscellaneous Meds Triamcinolone ointment - rarely uses (for psoriasis)

## 2019-07-01 NOTE — Telephone Encounter (Signed)
Requesting: Percocet Contract: 08/20/202 UDS: 10/2018 Last OV: 05/07/19 Next OV: N/A Last Refill: 06/02/2019, #[id number] RF Database:   Please advise

## 2019-07-02 MED ORDER — OXYCODONE-ACETAMINOPHEN 10-325 MG PO TABS: 1.0000 | ORAL_TABLET | ORAL | 0 refills | Status: DC | PRN

## 2019-07-06 ENCOUNTER — Other Ambulatory Visit: Payer: Self-pay | Admitting: Pharmacist

## 2019-07-06 ENCOUNTER — Inpatient Hospital Stay: Payer: HMO

## 2019-07-06 ENCOUNTER — Encounter: Payer: Self-pay | Admitting: Family

## 2019-07-06 ENCOUNTER — Other Ambulatory Visit: Payer: Self-pay

## 2019-07-06 ENCOUNTER — Inpatient Hospital Stay: Payer: HMO | Attending: Family | Admitting: Family

## 2019-07-06 VITALS — BP 119/53 | HR 73 | Temp 97.5°F | Resp 16 | Ht 60.0 in | Wt 90.2 lb

## 2019-07-06 DIAGNOSIS — N189 Chronic kidney disease, unspecified: Secondary | ICD-10-CM | POA: Insufficient documentation

## 2019-07-06 DIAGNOSIS — D500 Iron deficiency anemia secondary to blood loss (chronic): Secondary | ICD-10-CM

## 2019-07-06 DIAGNOSIS — D631 Anemia in chronic kidney disease: Secondary | ICD-10-CM | POA: Diagnosis not present

## 2019-07-06 DIAGNOSIS — E611 Iron deficiency: Secondary | ICD-10-CM | POA: Insufficient documentation

## 2019-07-06 LAB — CBC WITH DIFFERENTIAL (CANCER CENTER ONLY)
Abs Immature Granulocytes: 0.07 10*3/uL (ref 0.00–0.07)
Basophils Absolute: 0 10*3/uL (ref 0.0–0.1)
Basophils Relative: 0 %
Eosinophils Absolute: 0.1 10*3/uL (ref 0.0–0.5)
Eosinophils Relative: 3 %
HCT: 30.5 % — ABNORMAL LOW (ref 36.0–46.0)
Hemoglobin: 9.4 g/dL — ABNORMAL LOW (ref 12.0–15.0)
Immature Granulocytes: 1 %
Lymphocytes Relative: 12 %
Lymphs Abs: 0.6 10*3/uL — ABNORMAL LOW (ref 0.7–4.0)
MCH: 28.2 pg (ref 26.0–34.0)
MCHC: 30.8 g/dL (ref 30.0–36.0)
MCV: 91.6 fL (ref 80.0–100.0)
Monocytes Absolute: 0.9 10*3/uL (ref 0.1–1.0)
Monocytes Relative: 17 %
Neutro Abs: 3.5 10*3/uL (ref 1.7–7.7)
Neutrophils Relative %: 67 %
Platelet Count: 227 10*3/uL (ref 150–400)
RBC: 3.33 MIL/uL — ABNORMAL LOW (ref 3.87–5.11)
RDW: 15 % (ref 11.5–15.5)
WBC Count: 5.1 10*3/uL (ref 4.0–10.5)
nRBC: 0 % (ref 0.0–0.2)

## 2019-07-06 LAB — CMP (CANCER CENTER ONLY)
ALT: 12 U/L (ref 0–44)
AST: 22 U/L (ref 15–41)
Albumin: 3.7 g/dL (ref 3.5–5.0)
Alkaline Phosphatase: 86 U/L (ref 38–126)
Anion gap: 7 (ref 5–15)
BUN: 23 mg/dL (ref 8–23)
CO2: 31 mmol/L (ref 22–32)
Calcium: 8.1 mg/dL — ABNORMAL LOW (ref 8.9–10.3)
Chloride: 99 mmol/L (ref 98–111)
Creatinine: 0.91 mg/dL (ref 0.44–1.00)
GFR, Est AFR Am: >60 mL/min (ref >60)
GFR, Estimated: >60 mL/min (ref >60)
Glucose, Bld: 103 mg/dL — ABNORMAL HIGH (ref 70–99)
Potassium: 4.1 mmol/L (ref 3.5–5.1)
Sodium: 137 mmol/L (ref 135–145)
Total Bilirubin: 0.4 mg/dL (ref 0.3–1.2)
Total Protein: 6.7 g/dL (ref 6.5–8.1)

## 2019-07-06 LAB — RETICULOCYTES
Immature Retic Fract: 9 % (ref 2.3–15.9)
RBC.: 3.31 MIL/uL — ABNORMAL LOW (ref 3.87–5.11)
Retic Count, Absolute: 42 10*3/uL (ref 19.0–186.0)
Retic Ct Pct: 1.3 % (ref 0.4–3.1)

## 2019-07-06 MED ORDER — SODIUM CHLORIDE 0.9% FLUSH
10.0000 mL | INTRAVENOUS | Status: DC | PRN
  Administered 2019-07-06: 10 mL
  Filled 2019-07-06: qty 10

## 2019-07-06 MED ORDER — HEPARIN SOD (PORK) LOCK FLUSH 100 UNIT/ML IV SOLN
500.0000 [IU] | Freq: Once | INTRAVENOUS | Status: AC | PRN
  Administered 2019-07-06: 500 [IU]
  Filled 2019-07-06: qty 5

## 2019-07-06 MED ORDER — EPOETIN ALFA-EPBX 40000 UNIT/ML IJ SOLN
40000.0000 [IU] | Freq: Once | INTRAMUSCULAR | Status: AC
  Administered 2019-07-06: 40000 [IU] via SUBCUTANEOUS

## 2019-07-06 MED ORDER — EPOETIN ALFA-EPBX 40000 UNIT/ML IJ SOLN
INTRAMUSCULAR | Status: AC
  Filled 2019-07-06: qty 1

## 2019-07-06 NOTE — Progress Notes (Signed)
Hematology and Oncology Follow Up Visit  [name] [id number] [birth year] 74 y.o. 07/06/2019   Principle Diagnosis:  Iron deficiency anemia Erythropoietin deficient anemia  Current Therapy:        IV iron as indicated  Retacrit 40,000 units SQfor hgb <11   Interim History:  [name] is here today for follow-up and injection. She is feeling fatigued. She had her BIV pacemaker generator changed out 2 weeks ago and still has some swelling and dark purple bruising at the site. No redness or drainage noted on exam. She did hold her Eliquis 2 days prior to this procedure.  She states that Dr. Caryl Comes is aware and evaluated a week or so ago. She has another follow-up appointment with them tomorrow.  She has not noted any blood loss. No other bruising noted. No petechiae.  She denies pain at this time.  SOB with any exertion is unchanged. She is currently on 3L [place] supplemental O2 24 hours a day.  No fever, chills, n/v, cough, rash, dizziness, chest pain, palpitations, abdominal pain or changes in bowel or bladder habits.  No swelling or tenderness in her extremities.  No falls or syncopal episodes to report.  Neuropathy in her hands and feet comes and goes. She states that this is stable.  She is eating well and hydrating properly. Her weight is stable at 90 lbs.   ECOG Performance Status: 1 - Symptomatic but completely ambulatory  Medications:  Allergies as of 07/06/2019      Reactions   Dabigatran Etexilate Mesylate Other (See Comments)   INTERNAL BLEEDING-pradaxa   Augmentin [amoxicillin-pot Clavulanate] Diarrhea, Nausea Only   Did it involve swelling of the face/tongue/throat, SOB, or low BP? No Did it involve sudden or severe rash/hives, skin peeling, or any reaction on the inside of your mouth or nose? No Did you need to seek medical attention at a hospital or doctor's office? No When did it last happen?02/2019 If all above answers are "NO", may proceed with cephalosporin  use.   Talwin [pentazocine] Other (See Comments)   hallucinations   Lactate Rash      Medication List       Accurate as of July 06, 2019 12:00 PM. If you have any questions, ask your nurse or doctor.        acetaminophen 650 MG CR tablet Commonly known as: TYLENOL Take 650 mg by mouth every 8 (eight) hours as needed for pain.   albuterol 108 (90 Base) MCG/ACT inhaler Commonly known as: VENTOLIN HFA Inhale 2 puffs into the lungs every 6 (six) hours as needed for wheezing or shortness of breath.   amLODipine 2.5 MG tablet Commonly known as: NORVASC Take 1 tablet (2.5 mg total) by mouth daily.   apixaban 5 MG Tabs tablet Commonly known as: Eliquis Take 1 tablet (5 mg total) by mouth 2 (two) times daily.   atorvastatin 20 MG tablet Commonly known as: LIPITOR TAKE 1 TABLET BY MOUTH ONCE DAILY AT  6PM   azithromycin 500 MG tablet Commonly known as: Zithromax Take 1 tablet (500 mg total) by mouth 3 (three) times a week.   carvedilol 25 MG tablet Commonly known as: COREG Take 1 tablet (25 mg total) by mouth 2 (two) times daily.   denosumab 60 MG/ML Sosy injection Commonly known as: PROLIA Inject 60 mg into the skin every 6 (six) months.   ethambutol 400 MG tablet Commonly known as: MYAMBUTOL Take 2 tablets (800 mg total) by mouth 3 (three) times  a week. Take with 2 tablets of 100 mg of ethambutol to equal 1000 mg total.   ethambutol 100 MG tablet Commonly known as: MYAMBUTOL Take 2 tablets (200 mg total) by mouth 3 (three) times a week. Take with 2 tablets of 400 mg of ethambutol to equal 1000 mg total.   furosemide 40 MG tablet Commonly known as: LASIX TAKE 1 TABLET BY MOUTH IN THE MORNING   gabapentin 600 MG tablet Commonly known as: NEURONTIN Take 1 tablet by mouth 4 times daily   Incruse Ellipta 62.5 MCG/INH Aepb Generic drug: umeclidinium bromide Inhale 1 puff into the lungs daily.   lidocaine-prilocaine cream Commonly known as: EMLA Apply 1  application topically as needed.   methylPREDNISolone 4 MG tablet Commonly known as: Medrol Medrol dose pack. Take as instructed   nitrofurantoin 100 MG capsule Commonly known as: MACRODANTIN Take 100 mg by mouth daily.   nitroGLYCERIN 0.4 MG SL tablet Commonly known as: NITROSTAT Place 1 tablet (0.4 mg total) under the tongue every 5 (five) minutes as needed. CHEST PAIN   oxyCODONE-acetaminophen 10-325 MG tablet Commonly known as: PERCOCET Take 1 tablet by mouth every 4 (four) hours as needed. PAIN   OXYGEN Inhale 3 L into the lungs continuous.   pantoprazole 40 MG tablet Commonly known as: PROTONIX Take 1 tablet by mouth twice daily   promethazine 25 MG tablet Commonly known as: PHENERGAN Take 25 mg by mouth every 6 (six) hours as needed for nausea or vomiting.   rifabutin 150 MG capsule Commonly known as: MYCOBUTIN Take 2 capsules (300 mg total) by mouth 3 (three) times a week.   tiZANidine 4 MG tablet Commonly known as: ZANAFLEX Take 1 tablet (4 mg total) by mouth 3 (three) times daily as needed. for muscle spams   triamcinolone ointment 0.1 % Commonly known as: KENALOG Apply 1 application topically daily as needed (psoriasis).   zolpidem 5 MG tablet Commonly known as: AMBIEN TAKE 1 TABLET BY MOUTH EVERY DAY AT BEDTIME AS NEEDED       Allergies:  Allergies  Allergen Reactions  . Dabigatran Etexilate Mesylate Other (See Comments)    INTERNAL BLEEDING-pradaxa  . Augmentin [Amoxicillin-Pot Clavulanate] Diarrhea and Nausea Only    Did it involve swelling of the face/tongue/throat, SOB, or low BP? No Did it involve sudden or severe rash/hives, skin peeling, or any reaction on the inside of your mouth or nose? No Did you need to seek medical attention at a hospital or doctor's office? No When did it last happen?02/2019 If all above answers are "NO", may proceed with cephalosporin use.   [name] [Pentazocine] Other (See Comments)    hallucinations  .  Lactate Rash    Past Medical History, Surgical history, Social history, and Family History were reviewed and updated.  Review of Systems: All other 10 point review of systems is negative.   Physical Exam:  vitals were not taken for this visit.   Wt Readings from Last 3 Encounters:  06/24/19 89 lb (40.4 kg)  06/19/19 89 lb (40.4 kg)  06/01/19 87 lb (39.5 kg)    Ocular: Sclerae unicteric, pupils equal, round and reactive to light Ear-nose-throat: Oropharynx clear, dentition fair Lymphatic: No cervical or supraclavicular adenopathy Lungs no rales or rhonchi, good excursion bilaterally Heart regular rate and rhythm, no murmur appreciated Abd soft, nontender, positive bowel sounds, no liver or spleen tip palpated on exam, no fluid wave  MSK no focal spinal tenderness, no joint edema Neuro: non-focal, well-oriented, appropriate  affect Breasts: Deferred   Lab Results  Component Value Date   WBC 5.1 07/06/2019   HGB 9.4 (L) 07/06/2019   HCT 30.5 (L) 07/06/2019   MCV 91.6 07/06/2019   PLT 227 07/06/2019   Lab Results  Component Value Date   FERRITIN 619 (H) 06/01/2019   IRON 109 06/01/2019   TIBC 246 06/01/2019   UIBC 137 06/01/2019   IRONPCTSAT 44 06/01/2019   Lab Results  Component Value Date   RETICCTPCT 1.3 07/06/2019   RBC 3.31 (L) 07/06/2019   RBC 3.33 (L) 07/06/2019   RETICCTABS 64.6 04/01/2009   No results found for: KPAFRELGTCHN, LAMBDASER, KAPLAMBRATIO No results found for: IGGSERUM, IGA, IGMSERUM No results found for: Odetta Pink, SPEI   Chemistry      Component Value Date/Time   NA 135 06/16/2019 1333   K 4.0 06/16/2019 1333   CL 99 06/16/2019 1333   CO2 30 (H) 06/16/2019 1333   BUN 31 (H) 06/16/2019 1333   CREATININE 1.13 (H) 06/16/2019 1333   CREATININE 0.91 06/01/2019 1337   CREATININE 0.91 03/16/2016 1539      Component Value Date/Time   CALCIUM 8.5 (L) 06/16/2019 1333   ALKPHOS 111  06/01/2019 1337   AST 18 06/01/2019 1337   ALT 14 06/01/2019 1337   BILITOT 0.3 06/01/2019 1337      Impression and Plan: Ms. Mattox is a very pleasant 74yo caucasian female with iron deficiency anemia secondary to malabsorption and possible GI blood loss with Eliquis. Hgb dropped from 12.3 to 9.4. Her last injection was 4 weeks ago and she had her pacemaker generator replaced with significant bruising.  We will proceed with Retacrit injection today.  Iron studies are pending and we will replace next week if needed.   Laverna Peace, NP 4/19/202112:00 PM

## 2019-07-06 NOTE — Patient Outreach (Signed)
[facility])  [place] Team    [facility] pharmacy case will be closed as our team is transitioning from the [place] Management Department into the [facility] Quality Department and will no longer be using [facility] for documentation purposes.   [facility] pharmacy technician will continue to assist patient with medication assistance program applications until complete.       Elayne Guerin, PharmD, [place] Clinical Pharmacist [phone]

## 2019-07-06 NOTE — Patient Instructions (Signed)

## 2019-07-07 ENCOUNTER — Other Ambulatory Visit: Payer: Self-pay | Admitting: Family

## 2019-07-07 ENCOUNTER — Ambulatory Visit: Payer: HMO | Admitting: *Deleted

## 2019-07-07 DIAGNOSIS — I428 Other cardiomyopathies: Secondary | ICD-10-CM

## 2019-07-07 DIAGNOSIS — I429 Cardiomyopathy, unspecified: Secondary | ICD-10-CM

## 2019-07-07 LAB — IRON AND TIBC
Iron: 30 ug/dL — ABNORMAL LOW (ref 41–142)
Saturation Ratios: 14 % — ABNORMAL LOW (ref 21–57)
TIBC: 220 ug/dL — ABNORMAL LOW (ref 236–444)
UIBC: 190 ug/dL (ref 120–384)

## 2019-07-07 LAB — FERRITIN: Ferritin: 746 ng/mL — ABNORMAL HIGH (ref 11–307)

## 2019-07-07 NOTE — Progress Notes (Signed)
Wound check follow up in clinic. Wound site has increased edema and hematoma firm to touch. No drainage, or bleeding from wound site. Dr Curt Bears in to assess hematoma.  Compression dressing applied to wound site and patient instructed to leave dressing in place until follow up.  Per Dr Curt Bears Eliquis to be held for 5 days. Return office visit for wound check Monday 07/13/19.

## 2019-07-07 NOTE — Patient Instructions (Signed)
Keep pressure dressing in place until return wound check. Stop Eliquis for 5 days. Restart Eliquis Monday 07/13/19. Call the office if you develop drainge, bledding, fever, chills or any signs of infection.

## 2019-07-08 ENCOUNTER — Other Ambulatory Visit: Payer: Self-pay | Admitting: Pharmacy Technician

## 2019-07-08 NOTE — Patient Outreach (Signed)
[facility]) Care Management  07/08/2019  [name] 1945/03/27 [id number]    Successful call placed to patient regarding patient assistance receipt of attestation form from [employer] for Proventil HFA, HIPAA identifiers verified.   Informed patient that [employer] mailed her another attestation form on 06/29/2019. Patient informed she will keep a check on her mail as she has not received anything from them as of today.  Follow up:  Will follow up with patient in 10-14 business days if call is not returned.  Georgann Bramble P. Davion Meara, [place]  [phone]

## 2019-07-09 ENCOUNTER — Other Ambulatory Visit: Payer: Self-pay

## 2019-07-09 ENCOUNTER — Inpatient Hospital Stay: Payer: HMO

## 2019-07-09 VITALS — BP 112/58 | HR 69 | Temp 97.8°F | Resp 19

## 2019-07-09 DIAGNOSIS — D500 Iron deficiency anemia secondary to blood loss (chronic): Secondary | ICD-10-CM

## 2019-07-09 DIAGNOSIS — N189 Chronic kidney disease, unspecified: Secondary | ICD-10-CM | POA: Diagnosis not present

## 2019-07-09 MED ORDER — SODIUM CHLORIDE 0.9 % IV SOLN
Freq: Once | INTRAVENOUS | Status: AC
  Filled 2019-07-09: qty 250

## 2019-07-09 MED ORDER — SODIUM CHLORIDE 0.9% FLUSH
10.0000 mL | INTRAVENOUS | Status: DC | PRN
  Administered 2019-07-09: 10 mL
  Filled 2019-07-09: qty 10

## 2019-07-09 MED ORDER — HEPARIN SOD (PORK) LOCK FLUSH 100 UNIT/ML IV SOLN
500.0000 [IU] | Freq: Once | INTRAVENOUS | Status: AC | PRN
  Administered 2019-07-09: 500 [IU]
  Filled 2019-07-09: qty 5

## 2019-07-09 MED ORDER — SODIUM CHLORIDE 0.9 % IV SOLN
200.0000 mg | Freq: Once | INTRAVENOUS | Status: AC
  Administered 2019-07-09: 200 mg via INTRAVENOUS
  Filled 2019-07-09: qty 200

## 2019-07-12 DIAGNOSIS — J449 Chronic obstructive pulmonary disease, unspecified: Secondary | ICD-10-CM | POA: Diagnosis not present

## 2019-07-12 DIAGNOSIS — I4891 Unspecified atrial fibrillation: Secondary | ICD-10-CM | POA: Diagnosis not present

## 2019-07-13 ENCOUNTER — Ambulatory Visit (INDEPENDENT_AMBULATORY_CARE_PROVIDER_SITE_OTHER): Payer: HMO | Admitting: *Deleted

## 2019-07-13 ENCOUNTER — Other Ambulatory Visit: Payer: Self-pay

## 2019-07-13 ENCOUNTER — Other Ambulatory Visit: Payer: Self-pay | Admitting: Family Medicine

## 2019-07-13 DIAGNOSIS — G4700 Insomnia, unspecified: Secondary | ICD-10-CM

## 2019-07-13 DIAGNOSIS — Z950 Presence of cardiac pacemaker: Secondary | ICD-10-CM

## 2019-07-13 DIAGNOSIS — I428 Other cardiomyopathies: Secondary | ICD-10-CM

## 2019-07-13 NOTE — Patient Instructions (Signed)
Hold Eliquis until return visit 07/20/19. Keep pressure dressing in place until follow up visit. Call office in swelling increases , or you develop a fever , chills, or drainage from site.

## 2019-07-13 NOTE — Progress Notes (Signed)
Pressure dressing removed. Wound site with no change from previous check. Edematous , soft , bruising present. No drainage or bleeding from wound site. Dr Curt Bears in to assess wound. Pressure dressing reapplied and Eliquis placed on hold until 07/20/19 when  patient is reassessed. ED precautions given.

## 2019-07-13 NOTE — Telephone Encounter (Signed)
Ambien refill.   Last OV: 05/07/2019  Last Fill: 06/12/2019 #30 and 0RF Pt sig: 1 tab qhs prn UDS: None recently seen

## 2019-07-14 ENCOUNTER — Inpatient Hospital Stay: Payer: HMO

## 2019-07-14 VITALS — BP 108/48 | HR 78 | Temp 97.6°F | Resp 19

## 2019-07-14 DIAGNOSIS — A310 Pulmonary mycobacterial infection: Secondary | ICD-10-CM | POA: Diagnosis not present

## 2019-07-14 DIAGNOSIS — R072 Precordial pain: Secondary | ICD-10-CM | POA: Diagnosis not present

## 2019-07-14 DIAGNOSIS — D500 Iron deficiency anemia secondary to blood loss (chronic): Secondary | ICD-10-CM

## 2019-07-14 MED ORDER — HEPARIN SOD (PORK) LOCK FLUSH 100 UNIT/ML IV SOLN
500.0000 [IU] | Freq: Once | INTRAVENOUS | Status: AC | PRN
  Administered 2019-07-14: 500 [IU]
  Filled 2019-07-14: qty 5

## 2019-07-14 MED ORDER — ALTEPLASE 2 MG IJ SOLR
2.0000 mg | Freq: Once | INTRAMUSCULAR | Status: DC | PRN
  Filled 2019-07-14: qty 2

## 2019-07-14 MED ORDER — SODIUM CHLORIDE 0.9 % IV SOLN
200.0000 mg | Freq: Once | INTRAVENOUS | Status: AC
  Administered 2019-07-14: 200 mg via INTRAVENOUS
  Filled 2019-07-14: qty 200

## 2019-07-14 MED ORDER — SODIUM CHLORIDE 0.9 % IV SOLN
Freq: Once | INTRAVENOUS | Status: AC
  Filled 2019-07-14: qty 250

## 2019-07-14 MED ORDER — SODIUM CHLORIDE 0.9% FLUSH
10.0000 mL | Freq: Once | INTRAVENOUS | Status: AC | PRN
  Administered 2019-07-14: 10 mL
  Filled 2019-07-14: qty 10

## 2019-07-14 NOTE — Progress Notes (Signed)
Pt presents today in wheelchair, stating she has grown increasingly weaker. Upon further assessment, BP found to be 80/42. Normal HR.   Pt has contacted cardiologist d/t recent hypotensive findings. Also taking Percocet prn for lt chest hematoma.   Per Dr Marin Olp, 1L IVF to be given with Venofer today. Pt aware to f/u with cardiologist if she doesn't receive a return call.   dph

## 2019-07-14 NOTE — Patient Instructions (Signed)
Hypotension As your heart beats, it forces blood through your body. This force is called blood pressure. If you have hypotension, you have low blood pressure. When your blood pressure is too low, you may not get enough blood to your brain or other parts of your body. This may cause you to feel weak, light-headed, have a fast heartbeat, or even pass out (faint). Low blood pressure may be harmless, or it may cause serious problems. What are the causes?  Blood loss.  Not enough water in the body (dehydration).  Heart problems.  Hormone problems.  Pregnancy.  A very bad infection.  Not having enough of certain nutrients.  Very bad allergic reactions.  Certain medicines. What increases the risk?  Age. The risk increases as you get older.  Conditions that affect the heart or the brain and spinal cord (central nervous system).  Taking certain medicines.  Being pregnant. What are the signs or symptoms?  Feeling: ? Weak. ? Light-headed. ? Dizzy. ? Tired (fatigued).  Blurred vision.  Fast heartbeat.  Passing out, in very bad cases. How is this treated?  Changing your diet. This may involve eating more salt (sodium) or drinking more water.  Taking medicines to raise your blood pressure.  Changing how much you take (the dosage) of some of your medicines.  Wearing compression stockings. These stockings help to prevent blood clots and reduce swelling in your legs. In some cases, you may need to go to the hospital for:  Fluid replacement. This means you will receive fluids through an IV tube.  Blood replacement. This means you will receive donated blood through an IV tube (transfusion).  Treating an infection or heart problems, if this applies.  Monitoring. You may need to be monitored while medicines that you are taking wear off. Follow these instructions at home: Eating and drinking   Drink enough fluids to keep your pee (urine) pale yellow.  Eat a healthy diet.  Follow instructions from your doctor about what you can eat or drink. A healthy diet includes: ? Fresh fruits and vegetables. ? Whole grains. ? Low-fat (lean) meats. ? Low-fat dairy products.  Eat extra salt only as told. Do not add extra salt to your diet unless your doctor tells you to.  Eat small meals often.  Avoid standing up quickly after you eat. Medicines  Take over-the-counter and prescription medicines only as told by your doctor. ? Follow instructions from your doctor about changing how much you take of your medicines, if this applies. ? Do not stop or change any of your medicines on your own. General instructions   Wear compression stockings as told by your doctor.  Get up slowly from lying down or sitting.  Avoid hot showers and a lot of heat as told by your doctor.  Return to your normal activities as told by your doctor. Ask what activities are safe for you.  Do not use any products that contain nicotine or tobacco, such as cigarettes, e-cigarettes, and chewing tobacco. If you need help quitting, ask your doctor.  Keep all follow-up visits as told by your doctor. This is important. Contact a doctor if:  You throw up (vomit).  You have watery poop (diarrhea).  You have a fever for more than 2-3 days.  You feel more thirsty than normal.  You feel weak and tired. Get help right away if:  You have chest pain.  You have a fast or uneven heartbeat.  You lose feeling (have numbness) in any  part of your body.  You cannot move your arms or your legs.  You have trouble talking.  You get sweaty or feel light-headed.  You pass out.  You have trouble breathing.  You have trouble staying awake.  You feel mixed up (confused). Summary  Hypotension is also called low blood pressure. It is when the force of blood pumping through your arteries is too weak.  Hypotension may be harmless, or it may cause serious problems.  Treatment may include changing  your diet and medicines, and wearing compression stockings.  In very bad cases, you may need to go to the hospital. This information is not intended to replace advice given to you by your health care provider. Make sure you discuss any questions you have with your health care provider. Document Revised: 08/29/2017 Document Reviewed: 08/29/2017 Elsevier Patient Education  [place]. Iron Sucrose injection What is this medicine? IRON SUCROSE (AHY ern SOO krohs) is an iron complex. Iron is used to make healthy red blood cells, which carry oxygen and nutrients throughout the body. This medicine is used to treat iron deficiency anemia in people with chronic kidney disease. This medicine may be used for other purposes; ask your health care provider or pharmacist if you have questions. COMMON BRAND NAME(S): Venofer What should I tell my health care provider before I take this medicine? They need to know if you have any of these conditions:  anemia not caused by low iron levels  heart disease  high levels of iron in the blood  kidney disease  liver disease  an unusual or allergic reaction to iron, other medicines, foods, dyes, or preservatives  pregnant or trying to get pregnant  breast-feeding How should I use this medicine? This medicine is for infusion into a vein. It is given by a health care professional in a hospital or clinic setting. Talk to your pediatrician regarding the use of this medicine in children. While this drug may be prescribed for children as young as 2 years for selected conditions, precautions do apply. Overdosage: If you think you have taken too much of this medicine contact a poison control center or emergency room at once. NOTE: This medicine is only for you. Do not share this medicine with others. What if I miss a dose? It is important not to miss your dose. Call your doctor or health care professional if you are unable to keep an appointment. What may  interact with this medicine? Do not take this medicine with any of the following medications:  deferoxamine  dimercaprol  other iron products This medicine may also interact with the following medications:  chloramphenicol  deferasirox This list may not describe all possible interactions. Give your health care provider a list of all the medicines, herbs, non-prescription drugs, or dietary supplements you use. Also tell them if you smoke, drink alcohol, or use illegal drugs. Some items may interact with your medicine. What should I watch for while using this medicine? Visit your doctor or healthcare professional regularly. Tell your doctor or healthcare professional if your symptoms do not start to get better or if they get worse. You may need blood work done while you are taking this medicine. You may need to follow a special diet. Talk to your doctor. Foods that contain iron include: whole grains/cereals, dried fruits, beans, or peas, leafy green vegetables, and organ meats (liver, kidney). What side effects may I notice from receiving this medicine? Side effects that you should report to your  doctor or health care professional as soon as possible:  allergic reactions like skin rash, itching or hives, swelling of the face, lips, or tongue  breathing problems  changes in blood pressure  cough  fast, irregular heartbeat  feeling faint or lightheaded, falls  fever or chills  flushing, sweating, or hot feelings  joint or muscle aches/pains  seizures  swelling of the ankles or feet  unusually weak or tired Side effects that usually do not require medical attention (report to your doctor or health care professional if they continue or are bothersome):  diarrhea  feeling achy  headache  irritation at site where injected  nausea, vomiting  stomach upset  tiredness This list may not describe all possible side effects. Call your doctor for medical advice about side  effects. You may report side effects to FDA at 1-800-FDA-1088. Where should I keep my medicine? This drug is given in a hospital or clinic and will not be stored at home. NOTE: This sheet is a summary. It may not cover all possible information. If you have questions about this medicine, talk to your doctor, pharmacist, or health care provider.  2020 Elsevier/Gold Standard (2010-12-14 17:14:35)

## 2019-07-15 ENCOUNTER — Other Ambulatory Visit: Payer: Self-pay

## 2019-07-15 ENCOUNTER — Encounter (HOSPITAL_COMMUNITY): Payer: Self-pay

## 2019-07-15 ENCOUNTER — Emergency Department (HOSPITAL_COMMUNITY): Payer: HMO

## 2019-07-15 ENCOUNTER — Inpatient Hospital Stay (HOSPITAL_COMMUNITY)
Admission: EM | Admit: 2019-07-15 | Discharge: 2019-07-18 | DRG: 178 | Disposition: A | Discharge status: Home / Self Care | Payer: HMO | Attending: Internal Medicine | Admitting: Internal Medicine

## 2019-07-15 DIAGNOSIS — Z86718 Personal history of other venous thrombosis and embolism: Secondary | ICD-10-CM | POA: Diagnosis not present

## 2019-07-15 DIAGNOSIS — I340 Nonrheumatic mitral (valve) insufficiency: Secondary | ICD-10-CM | POA: Diagnosis not present

## 2019-07-15 DIAGNOSIS — Z950 Presence of cardiac pacemaker: Secondary | ICD-10-CM

## 2019-07-15 DIAGNOSIS — E161 Other hypoglycemia: Secondary | ICD-10-CM | POA: Diagnosis not present

## 2019-07-15 DIAGNOSIS — I5032 Chronic diastolic (congestive) heart failure: Secondary | ICD-10-CM | POA: Diagnosis present

## 2019-07-15 DIAGNOSIS — Z881 Allergy status to other antibiotic agents status: Secondary | ICD-10-CM | POA: Diagnosis not present

## 2019-07-15 DIAGNOSIS — J9611 Chronic respiratory failure with hypoxia: Secondary | ICD-10-CM | POA: Diagnosis present

## 2019-07-15 DIAGNOSIS — M797 Fibromyalgia: Secondary | ICD-10-CM | POA: Diagnosis present

## 2019-07-15 DIAGNOSIS — I480 Paroxysmal atrial fibrillation: Secondary | ICD-10-CM | POA: Diagnosis not present

## 2019-07-15 DIAGNOSIS — I110 Hypertensive heart disease with heart failure: Secondary | ICD-10-CM | POA: Diagnosis present

## 2019-07-15 DIAGNOSIS — R079 Chest pain, unspecified: Secondary | ICD-10-CM | POA: Diagnosis not present

## 2019-07-15 DIAGNOSIS — I2510 Atherosclerotic heart disease of native coronary artery without angina pectoris: Secondary | ICD-10-CM | POA: Diagnosis present

## 2019-07-15 DIAGNOSIS — R05 Cough: Secondary | ICD-10-CM | POA: Diagnosis not present

## 2019-07-15 DIAGNOSIS — I081 Rheumatic disorders of both mitral and tricuspid valves: Secondary | ICD-10-CM | POA: Diagnosis not present

## 2019-07-15 DIAGNOSIS — R509 Fever, unspecified: Secondary | ICD-10-CM | POA: Diagnosis not present

## 2019-07-15 DIAGNOSIS — Z95810 Presence of automatic (implantable) cardiac defibrillator: Secondary | ICD-10-CM | POA: Diagnosis not present

## 2019-07-15 DIAGNOSIS — Z888 Allergy status to other drugs, medicaments and biological substances status: Secondary | ICD-10-CM | POA: Diagnosis not present

## 2019-07-15 DIAGNOSIS — I4892 Unspecified atrial flutter: Secondary | ICD-10-CM | POA: Diagnosis present

## 2019-07-15 DIAGNOSIS — Z79899 Other long term (current) drug therapy: Secondary | ICD-10-CM

## 2019-07-15 DIAGNOSIS — Z8249 Family history of ischemic heart disease and other diseases of the circulatory system: Secondary | ICD-10-CM | POA: Diagnosis not present

## 2019-07-15 DIAGNOSIS — Z8614 Personal history of Methicillin resistant Staphylococcus aureus infection: Secondary | ICD-10-CM

## 2019-07-15 DIAGNOSIS — M545 Low back pain: Secondary | ICD-10-CM | POA: Diagnosis not present

## 2019-07-15 DIAGNOSIS — I4891 Unspecified atrial fibrillation: Secondary | ICD-10-CM | POA: Diagnosis present

## 2019-07-15 DIAGNOSIS — Z20822 Contact with and (suspected) exposure to covid-19: Secondary | ICD-10-CM | POA: Diagnosis present

## 2019-07-15 DIAGNOSIS — Z95828 Presence of other vascular implants and grafts: Secondary | ICD-10-CM

## 2019-07-15 DIAGNOSIS — J449 Chronic obstructive pulmonary disease, unspecified: Secondary | ICD-10-CM | POA: Diagnosis present

## 2019-07-15 DIAGNOSIS — R072 Precordial pain: Secondary | ICD-10-CM | POA: Diagnosis present

## 2019-07-15 DIAGNOSIS — G4489 Other headache syndrome: Secondary | ICD-10-CM | POA: Diagnosis not present

## 2019-07-15 DIAGNOSIS — E162 Hypoglycemia, unspecified: Secondary | ICD-10-CM | POA: Diagnosis not present

## 2019-07-15 DIAGNOSIS — Z833 Family history of diabetes mellitus: Secondary | ICD-10-CM

## 2019-07-15 DIAGNOSIS — Z96641 Presence of right artificial hip joint: Secondary | ICD-10-CM | POA: Diagnosis present

## 2019-07-15 DIAGNOSIS — R7881 Bacteremia: Secondary | ICD-10-CM | POA: Diagnosis not present

## 2019-07-15 DIAGNOSIS — Z8701 Personal history of pneumonia (recurrent): Secondary | ICD-10-CM | POA: Diagnosis not present

## 2019-07-15 DIAGNOSIS — Z803 Family history of malignant neoplasm of breast: Secondary | ICD-10-CM

## 2019-07-15 DIAGNOSIS — Z87891 Personal history of nicotine dependence: Secondary | ICD-10-CM | POA: Diagnosis not present

## 2019-07-15 DIAGNOSIS — Z823 Family history of stroke: Secondary | ICD-10-CM | POA: Diagnosis not present

## 2019-07-15 DIAGNOSIS — Z9981 Dependence on supplemental oxygen: Secondary | ICD-10-CM

## 2019-07-15 DIAGNOSIS — Z90710 Acquired absence of both cervix and uterus: Secondary | ICD-10-CM

## 2019-07-15 DIAGNOSIS — D708 Other neutropenia: Secondary | ICD-10-CM | POA: Diagnosis not present

## 2019-07-15 DIAGNOSIS — I10 Essential (primary) hypertension: Secondary | ICD-10-CM | POA: Diagnosis present

## 2019-07-15 DIAGNOSIS — A310 Pulmonary mycobacterial infection: Principal | ICD-10-CM | POA: Diagnosis present

## 2019-07-15 DIAGNOSIS — Z8711 Personal history of peptic ulcer disease: Secondary | ICD-10-CM | POA: Diagnosis not present

## 2019-07-15 DIAGNOSIS — R0602 Shortness of breath: Secondary | ICD-10-CM | POA: Diagnosis not present

## 2019-07-15 DIAGNOSIS — G2581 Restless legs syndrome: Secondary | ICD-10-CM | POA: Diagnosis present

## 2019-07-15 DIAGNOSIS — R0789 Other chest pain: Secondary | ICD-10-CM | POA: Diagnosis not present

## 2019-07-15 LAB — CBC WITH DIFFERENTIAL/PLATELET
Abs Immature Granulocytes: 0.03 10*3/uL (ref 0.00–0.07)
Basophils Absolute: 0 10*3/uL (ref 0.0–0.1)
Basophils Relative: 0 %
Eosinophils Absolute: 0 10*3/uL (ref 0.0–0.5)
Eosinophils Relative: 0 %
HCT: 37.7 % (ref 36.0–46.0)
Hemoglobin: 11.3 g/dL — ABNORMAL LOW (ref 12.0–15.0)
Immature Granulocytes: 1 %
Lymphocytes Relative: 4 %
Lymphs Abs: 0.2 10*3/uL — ABNORMAL LOW (ref 0.7–4.0)
MCH: 28.3 pg (ref 26.0–34.0)
MCHC: 30 g/dL (ref 30.0–36.0)
MCV: 94.5 fL (ref 80.0–100.0)
Monocytes Absolute: 0.4 10*3/uL (ref 0.1–1.0)
Monocytes Relative: 8 %
Neutro Abs: 4 10*3/uL (ref 1.7–7.7)
Neutrophils Relative %: 87 %
Platelets: 163 10*3/uL (ref 150–400)
RBC: 3.99 MIL/uL (ref 3.87–5.11)
RDW: 15.7 % — ABNORMAL HIGH (ref 11.5–15.5)
WBC: 4.7 10*3/uL (ref 4.0–10.5)
nRBC: 0 % (ref 0.0–0.2)

## 2019-07-15 LAB — COMPREHENSIVE METABOLIC PANEL
ALT: 10 U/L (ref 0–44)
AST: 28 U/L (ref 15–41)
Albumin: 3.3 g/dL — ABNORMAL LOW (ref 3.5–5.0)
Alkaline Phosphatase: 67 U/L (ref 38–126)
Anion gap: 15 (ref 5–15)
BUN: 21 mg/dL (ref 8–23)
CO2: 25 mmol/L (ref 22–32)
Calcium: 7.6 mg/dL — ABNORMAL LOW (ref 8.9–10.3)
Chloride: 104 mmol/L (ref 98–111)
Creatinine, Ser: 0.98 mg/dL (ref 0.44–1.00)
GFR calc Af Amer: >60 mL/min (ref >60)
GFR calc non Af Amer: 57 mL/min — ABNORMAL LOW (ref >60)
Glucose, Bld: 82 mg/dL (ref 70–99)
Potassium: 4.1 mmol/L (ref 3.5–5.1)
Sodium: 144 mmol/L (ref 135–145)
Total Bilirubin: 0.6 mg/dL (ref 0.3–1.2)
Total Protein: 6.6 g/dL (ref 6.5–8.1)

## 2019-07-15 LAB — PROTIME-INR
INR: 1.1 (ref 0.8–1.2)
Prothrombin Time: 13.6 seconds (ref 11.4–15.2)

## 2019-07-15 LAB — TROPONIN I (HIGH SENSITIVITY): Troponin I (High Sensitivity): 21 ng/L — ABNORMAL HIGH (ref <18)

## 2019-07-15 LAB — RESPIRATORY PANEL BY RT PCR (FLU A&B, COVID)
Influenza A by PCR: NEGATIVE
Influenza B by PCR: NEGATIVE
SARS Coronavirus 2 by RT PCR: NEGATIVE

## 2019-07-15 LAB — LACTIC ACID, PLASMA
Lactic Acid, Venous: 1.2 mmol/L (ref 0.5–1.9)
Lactic Acid, Venous: 0.9 mmol/L (ref 0.5–1.9)

## 2019-07-15 LAB — APTT: aPTT: 31 seconds (ref 24–36)

## 2019-07-15 MED ORDER — FENTANYL CITRATE (PF) 100 MCG/2ML IJ SOLN
50.0000 ug | INTRAMUSCULAR | Status: DC | PRN
  Administered 2019-07-15: 50 ug via INTRAVENOUS
  Filled 2019-07-15: qty 2

## 2019-07-15 MED ORDER — ACETAMINOPHEN 500 MG PO TABS
1000.0000 mg | ORAL_TABLET | Freq: Once | ORAL | Status: AC
  Administered 2019-07-15: 1000 mg via ORAL
  Filled 2019-07-15: qty 2

## 2019-07-15 NOTE — ED Triage Notes (Signed)
Pt BIB [facility] for eval of chest pain and fever onset this afternoon. EMS reports that CP started this afternoon around 1600. Pt reports associated SOB. Hx of pacemaker w/ battery replacement on 4/2 but reports no issues with that until this afternoon when the chest pain. Reports not vaccinated against Covid, states she has no plans to.

## 2019-07-15 NOTE — ED Provider Notes (Signed)
Plan to follow-up on CT chest and urinalysis Patient will need to be admitted.  Patient can await blood cultures and defer antibiotics for now   Ripley Fraise, MD 07/15/19 [id number]

## 2019-07-15 NOTE — ED Provider Notes (Signed)
Emergency Department Provider Note   I have reviewed the triage vital signs and the nursing notes.   HISTORY  Chief Complaint Fever and Chest Pain   HPI [name] is a 74 y.o. 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  Symptoms began abruptly this evening.  She denies shortness of breath.  She has had some cough.  She describes the chest pain is stabbing and in the lower left chest.  She has had pain like this in the past but did not seek medical treatment at that time because "I didn't want to make a fuss" but states it wasn't recent.  She has been taking her home antibiotics but did miss her doses this morning.  She denies any abdominal pain or vomiting.  She has had some mild loose stools since starting antibiotics but nothing severe per patient.  She denies any dysuria, hesitancy, urgency.  Denies headaches. She has not received the COVID vaccine.   Past Medical History:  Diagnosis Date  . Anemia   . Arthritis    right hip  . Atrial fibrillation ([place])   . CAD (coronary artery disease)    mild disease per cath in 2011  . CHF (congestive heart failure) ([place])   . COPD (chronic obstructive pulmonary disease) (HCC)    continuous O2- 2l  . Diverticulitis   . Elbow fracture, left aug 2012  . Erythropoietin deficiency anemia 06/19/2018  . Fibromyalgia    COUPLE OF TIMES A YEAR-PT HAS EPISODES OF CONFUSION-USUALLY INVOLVES DAY/NIGHT REVERSAL AND EPISODES OF TWITCHING AND FALLS--SEES DR. Jacelyn Grip - NEUROLOGIST-LAST SEEN 07/10/11  . Gastroparesis   . GERD (gastroesophageal reflux disease)   . H/O: GI bleed    from Pradaxa  . Hepatitis    hx of in high school   . HTN (hypertension)    under control; has been on med. x "years"  . Hx MRSA infection   . Hx of gastric ulcer   . Hx of stress fx aug  2011   right hip   . Iron malabsorption 08/14/2017  . LBBB (left bundle branch block)   . Oxygen dependent    2 liters via nasal cannula at all times  . Pacemaker    CRT therapy; followed by Dr. Caryl Comes  . Pleural effusion     s/p right thoracentesis 03/09  . Pneumonia - 03/09, 12/09, 02/10, 06/10   MOST RECENT FEB 2013  . Primary dilated cardiomyopathy (HCC)    EF 45 to 50% per echo in Jan 2012  . RLS (restless legs syndrome)   . Silent aspiration   . Small bowel obstruction ([place])   . Urinary incontinence     -INTERSTIM IMPLANT NOT FUNCTIONING PER PT  . Venous embolism and thrombosis of subclavian vein ([place])    after pacemaker insertion Oct 2011  . Vitamin B12 deficiency   . Wears glasses    reading    Patient Active Problem List   Diagnosis Date Noted  . Fever 07/16/2019  . MAI (mycobacterium avium-intracellulare) infection ([place]) 06/24/2019  . History of total right hip replacement 05/27/2019  . NICM (nonischemic cardiomyopathy) ([place]) 05/19/2019  . Educated about COVID-19 virus infection 09/07/2018  . Erythropoietin deficiency anemia 06/19/2018  . Swelling of calf 02/04/2018  . Bad odor of urine 12/09/2017  . Iron deficiency anemia due to chronic blood loss  08/14/2017  . Iron malabsorption 08/14/2017  . Hyperlipidemia LDL goal <100 07/08/2017  . Elevated ALT measurement   . Hypotension 06/20/2017  . HLD (hyperlipidemia) 06/20/2017  . CAD (coronary artery disease) 06/20/2017  . Protein-calorie malnutrition, severe ([place]) 06/20/2017  . Generalized weakness   . Weakness 05/30/2017  . Focal neurological deficit 05/30/2017  . Chronic diastolic CHF (congestive heart failure) ([place]) 05/30/2017  . TIA (transient ischemic attack) 05/30/2017  . AKI (acute kidney injury) ([place]) 05/30/2017  . Abnormal LFTs 05/30/2017  . Bronchiectasis ([place]) 04/22/2017  . Right otitis media 07/17/2016  . Cerumen impaction 07/17/2016  . Osteoporosis 03/16/2016  . Headache 11/20/2014  . Skin  infection 05/27/2014  . Insomnia 05/27/2014  . Abdominal wall pain 07/09/2013  . Other dysphagia 02/02/2013  . Platelets decreased ([place]) 01/29/2012  . Vitamin D deficiency 12/13/2011  . Hypokalemia 12/13/2011  . Hypocalcemia 12/12/2011  . Adhesions of intestine, very dense, with frozen abdomen 12/11/2011  . SBO (small bowel obstruction), recurrent 12/11/2011  . Protein-calorie malnutrition, moderate ([place]) 12/11/2011  . Oxygen dependent   . Hx of gastric ulcer   . UTI (urinary tract infection) 10/15/2011  . Allergic conjunctivitis 10/15/2011  . Degenerative arthritis of hip 08/03/2011  . B12 deficiency 07/02/2011  . Bilateral leg weakness 01/16/2011  . Anemia, chronic disease 09/14/2010  . Recurrent pneumonia 08/11/2010  . CRT- [employer] 06/26/2010  . DYSURIA 05/19/2010  . Chronic hypoxemic respiratory failure ([place]) 02/21/2010  . DEHYDRATION 01/06/2010  . GASTROENTERITIS 01/06/2010  . Congestive dilated cardiomyopathy ([place]) 11/17/2009  . LBBB 11/17/2009  . SYSTOLIC HEART FAILURE, CHRONIC 11/17/2009  . GERD (gastroesophageal reflux disease) 02/17/2009  . NONSPECIFIC ABN FINDNG RAD&OTH EXAM BILARY TRCT 09/03/2008  . COPD with emphysema ([place]) 06/28/2008  . DIVERTICULOSIS, COLON 06/03/2008  . GASTRITIS 06/02/2008  . Atrial fibrillation/flutter 12/08/2007  . GASTROPARESIS 08/07/2007  . NAUSEA, CHRONIC 08/07/2007  . Abdominal pain 08/07/2007  . SYNCOPE 02/17/2007  . RESTLESS LEG SYNDROME 08/01/2006  . Chronic pain syndrome 08/01/2006  . OSTEOARTHRITIS 07/31/2006  . Degenerative disc disease, lumbar 07/31/2006  . Fibromyalgia 07/31/2006  . CHRONIC FATIGUE SYNDROME 07/31/2006  . URINARY INCONTINENCE 07/31/2006  . ANEMIA-IRON DEFICIENCY 05/01/2006  . Essential hypertension 05/01/2006    Past Surgical History:  Procedure Laterality Date  . APPENDECTOMY    . BIV PACEMAKER GENERATOR CHANGEOUT N/A 06/19/2019   Procedure: BIV PACEMAKER GENERATOR CHANGEOUT;  Surgeon: Deboraha Sprang, MD;  Location: [facility];  Service: Cardiovascular;  Laterality: N/A;  . BRONCHIAL WASHINGS  05/25/2019   Procedure: BRONCHIAL WASHINGS;  Surgeon: Chesley Mires, MD;  Location: [facility];  Service: Endoscopy;;  . CARDIAC CATHETERIZATION  04/08/2006, 11/16/2009  . CHOLECYSTECTOMY N/A 06/27/2017   Procedure: OPEN CHOLECYSTECTOMY;  Surgeon: Ralene Ok, MD;  Location: [facility];  Service: General;  Laterality: N/A;  . COLECTOMY     Intestinal resection (5times)  . COLECTOMY  02/04/2000   ex. lap., intra-abd. subtotal colectomy with ileosigmoid colon anastomosies and lysis of adhesions  . CYSTOSCOPY  11/11/2008  . CYSTOSCOPY W/ RETROGRADES  11/11/2008   right  . CYSTOSCOPY WITH INJECTION  04/30/2006   transurethral collagen injection; incision vaginal stenosis  . ERCP N/A 06/23/2017   Procedure: ENDOSCOPIC RETROGRADE CHOLANGIOPANCREATOGRAPHY (ERCP);  Surgeon: Carol Ada, MD;  Location: [facility];  Service: Endoscopy;  Laterality: N/A;  . ERCP N/A 06/24/2017   Procedure: ENDOSCOPIC RETROGRADE CHOLANGIOPANCREATOGRAPHY (ERCP);  Surgeon: [name], MD;  Location: [facility];  Service: Endoscopy;  Laterality: N/A;  . ESOPHAGOGASTRODUODENOSCOPY N/A  02/02/2013   Procedure: ESOPHAGOGASTRODUODENOSCOPY (EGD);  Surgeon: Lafayette Dragon, MD;  Location: [facility];  Service: Endoscopy;  Laterality: N/A;  . EXPLORATORY LAPAROTOMY  04/27/2009   lysis of adhesions, gastrostomy tube  . GASTROCUTANEOUS FISTULA CLOSURE    . INTERSTIM IMPLANT PLACEMENT  05/28/2006 - stage I   06/05/2006 - stage II  . INTERSTIM IMPLANT PLACEMENT  02/05/2012   Procedure: [name] IMPLANT FIRST STAGE;  Surgeon: Reece Packer, MD;  Location: [facility];  Service: Urology;  Laterality: N/A;  Replacement of Interstim Lead     . INTERSTIM IMPLANT REVISION  03/06/2011   Procedure: REVISION OF [name];  Surgeon: Reece Packer, MD;  Location: [facility];  Service: Urology;  Laterality: N/A;  Replacement of  Neurostimulator  . INTERSTIM IMPLANT REVISION  10/23/2007  . IR IMAGING GUIDED PORT INSERTION  10/14/2017  . PACEMAKER PLACEMENT  12/21/2009  . Peg removed     with complications  . PORT-A-CATH REMOVAL  07/27/2011   Procedure: REMOVAL PORT-A-CATH;  Surgeon: Rolm Bookbinder, MD;  Location: [facility];  Service: General;  Laterality: N/A;  . PORTACATH PLACEMENT  12/16/2009  . SAVORY DILATION N/A 02/02/2013   Procedure: SAVORY DILATION;  Surgeon: Lafayette Dragon, MD;  [facility];  Service: Endoscopy;  Laterality: N/A;  . SMALL INTESTINE SURGERY  05/20/2001   ex. lap., resection of small bowel stricture; gastrostomy; insertion central line  . TONSILLECTOMY  age 47  . TOTAL ABDOMINAL HYSTERECTOMY     complete  . TOTAL HIP ARTHROPLASTY  08/03/2011   Procedure: TOTAL HIP ARTHROPLASTY ANTERIOR APPROACH;  Surgeon: Mcarthur Rossetti, MD;  Location: [facility];  Service: Orthopedics;  Laterality: Right;  [name] VIDEO BRONCHOSCOPY N/A 05/25/2019   Procedure: VIDEO BRONCHOSCOPY WITHOUT FLUORO;  Surgeon: Chesley Mires, MD;  [facility];  Service: Endoscopy;  Laterality: N/A;    Allergies Dabigatran etexilate mesylate, Augmentin [amoxicillin-pot clavulanate], and Talwin [pentazocine]  Family History  Problem Relation Age of Onset  . Heart disease Father   . Stroke Mother   . Heart disease Mother   . Heart disease Brother   . Breast cancer Maternal Aunt   . Heart disease Paternal Aunt   . Diabetes Paternal Grandfather   . Colon cancer Neg Hx     Social History Social History   Tobacco Use  . Smoking status: Former Smoker    Packs/day: 1.00    Years: 20.00    Pack years: 20.00    Quit date: 03/19/1986    Years since quitting: 33.3  . Smokeless tobacco: Never Used  Substance Use Topics  . Alcohol use: No    Alcohol/week: 0.0 standard drinks  . Drug use: No    Review of Systems  Constitutional: Positive fever. Eyes: No visual changes. ENT: No sore  throat. Cardiovascular: Positive chest pain. Respiratory: Denies shortness of breath. Gastrointestinal: No abdominal pain.  No nausea, no vomiting.  No diarrhea.  No constipation. Genitourinary: Negative for dysuria. Musculoskeletal: Negative for back pain. Skin: Negative for rash. Neurological: Negative for headaches, focal weakness or numbness.  10-point ROS otherwise negative.  ____________________________________________   PHYSICAL EXAM:  VITAL SIGNS: ED Triage Vitals  Enc Vitals Group     BP 07/15/19 1909 121/72     Pulse Rate 07/15/19 1909 (!) 104     Resp 07/15/19 1909 18     Temp 07/15/19 1909 (!) 102.7 F (39.3 C)     Temp Source 07/15/19 1909 Oral     SpO2  07/15/19 1905 98 %     Weight 07/15/19 1909 90 lb 6.2 oz (41 kg)     Height 07/15/19 1909 5\' 2"  (1.575 m)   Constitutional: Alert and oriented. Well appearing and in no acute distress. Eyes: Conjunctivae are normal.  Head: Atraumatic. Nose: No congestion/rhinnorhea. Mouth/Throat: Mucous membranes are moist.   Neck: No stridor.  Cardiovascular: Normal rate, regular rhythm. Good peripheral circulation. Grossly normal heart sounds.  Pacemaker site visualized with no erythema, drainage, warmth.  Respiratory: Normal respiratory effort.  No retractions. Lungs CTAB. Gastrointestinal: Soft and nontender. No distention.  Musculoskeletal: No lower extremity tenderness nor edema. No gross deformities of extremities. Neurologic:  Normal speech and language. Skin:  Skin is warm, dry and intact. No rash noted.   ____________________________________________   LABS (all labs ordered are listed, but only abnormal results are displayed)  Labs Reviewed  COMPREHENSIVE METABOLIC PANEL - Abnormal; Notable for the following components:      Result Value   Calcium 7.6 (*)    Albumin 3.3 (*)    GFR calc non Af Amer 57 (*)    All other components within normal limits  CBC WITH DIFFERENTIAL/PLATELET - Abnormal; Notable for the  following components:   Hemoglobin 11.3 (*)    RDW 15.7 (*)    Lymphs Abs 0.2 (*)    All other components within normal limits  URINALYSIS, ROUTINE W REFLEX MICROSCOPIC - Abnormal; Notable for the following components:   APPearance CLOUDY (*)    Specific Gravity, Urine 1.044 (*)    Hgb urine dipstick SMALL (*)    Protein, ur 30 (*)    Leukocytes,Ua LARGE (*)    WBC, UA >50 (*)    Bacteria, UA MANY (*)    All other components within normal limits  TROPONIN I (HIGH SENSITIVITY) - Abnormal; Notable for the following components:   Troponin I (High Sensitivity) 21 (*)    All other components within normal limits  TROPONIN I (HIGH SENSITIVITY) - Abnormal; Notable for the following components:   Troponin I (High Sensitivity) 19 (*)    All other components within normal limits  CULTURE, BLOOD (ROUTINE X 2)  CULTURE, BLOOD (ROUTINE X 2)  RESPIRATORY PANEL BY RT PCR (FLU A&B, COVID)  URINE CULTURE  LACTIC ACID, PLASMA  LACTIC ACID, PLASMA  PROTIME-INR  APTT  CBC  BASIC METABOLIC PANEL  BASIC METABOLIC PANEL  CBC   ____________________________________________  EKG  Atrial sensed, V-paced rhythm. No further interpretation.  ____________________________________________  RADIOLOGY  CT Angio Chest PE W and/or Wo Contrast  Result Date: 07/16/2019 CLINICAL DATA:  Chest pain and fever. EXAM: CT ANGIOGRAPHY CHEST WITH CONTRAST TECHNIQUE: Multidetector CT imaging of the chest was performed using the standard protocol during bolus administration of intravenous contrast. Multiplanar CT image reconstructions and MIPs were obtained to evaluate the vascular anatomy. CONTRAST:  98mL OMNIPAQUE IOHEXOL 350 MG/ML SOLN COMPARISON:  April 27, 2019 FINDINGS: Cardiovascular: There is a dual lead AICD. There is mild to moderate severity calcification of the thoracic aorta. Satisfactory opacification of the pulmonary arteries to the segmental level. No evidence of pulmonary embolism. Normal heart size. No  pericardial effusion. Mediastinum/Nodes: There is mild right hilar lymphadenopathy. Lungs/Pleura: Stable moderate to marked severity areas of diffuse bilateral peribronchovascular nodularity and nodular consolidation are seen. There is no evidence of a pleural effusion or pneumothorax. Upper Abdomen: No acute abnormality. Musculoskeletal: Degenerative changes seen within the thoracic spine. Review of the MIP images confirms the above findings. IMPRESSION: 1. No  evidence of acute pulmonary embolism. 2. Stable moderate to marked severity areas of diffuse bilateral peribronchovascular nodularity and nodular consolidation. 3. Aortic atherosclerosis. Aortic Atherosclerosis (ICD10-I70.0). Electronically Signed   By: Virgina Norfolk M.D.   On: 07/16/2019 00:37   DG Chest Port 1 View  Result Date: 07/15/2019 CLINICAL DATA:  Cough, shortness of breath and fever. EXAM: PORTABLE CHEST 1 VIEW COMPARISON:  Chest radiograph 10/02/2017. FINDINGS: Right anterior chest wall Port-A-Cath is present with tip projecting over the superior vena cava. Multi lead pacer apparatus overlies the left hemithorax, leads stable in position. Monitoring leads overlie the patient. Stable cardiac and mediastinal contours. Aortic atherosclerosis. Similar-appearing nodular opacities predominately involving the central aspect of the lungs bilaterally. No pleural effusion or pneumothorax. IMPRESSION: Scattered nodular opacities favored to correspond with history of MAI. Electronically Signed   By: Lovey Newcomer M.D.   On: 07/15/2019 19:59    ____________________________________________   PROCEDURES  Procedure(s) performed:   Procedures  None ____________________________________________   INITIAL IMPRESSION / ASSESSMENT AND PLAN / ED COURSE  Pertinent labs & imaging results that were available during my care of the patient were reviewed by me and considered in my medical decision making (see chart for details).   Patient presents to  the emergency department with fever, chest pain which began this afternoon.  She is currently undergoing treatment for pulmonary MAI.  She has not been vaccinated against COVID-19.  She is overall well-appearing and on her baseline 3 L nasal cannula oxygen.  The pacemaker site is well-appearing and does not appear infected.  No abdominal tenderness, UTI symptoms, or clear source for infection at this time.  Plan for sepsis work-up with chest x-ray and Covid swab including flu.   Labs and CXR reviewed. No sepsis. UA and CTA pending. CTA ordered with sharp CP and increased PE risk. Care transferred to Dr. Christy Gentles.  ____________________________________________  FINAL CLINICAL IMPRESSION(S) / ED DIAGNOSES  Final diagnoses:  Pulmonary MAI (mycobacterium avium-intracellulare) infection ([place])  Precordial pain  Acute febrile illness     MEDICATIONS GIVEN DURING THIS VISIT:  Medications  fentaNYL (SUBLIMAZE) injection 50 mcg (50 mcg Intravenous Given 07/15/19 2323)  sodium chloride flush (NS) 0.9 % injection 10-40 mL (has no administration in time range)  Chlorhexidine Gluconate Cloth 2 % PADS 6 each (0 each Topical Hold 07/16/19 1001)  rifabutin (MYCOBUTIN) capsule 300 mg (has no administration in time range)  zolpidem (AMBIEN) tablet 5 mg (has no administration in time range)  umeclidinium bromide (INCRUSE ELLIPTA) 62.5 MCG/INH 1 puff (1 puff Inhalation Given 07/16/19 1002)  pantoprazole (PROTONIX) EC tablet 40 mg (40 mg Oral Given 07/16/19 1703)  gabapentin (NEURONTIN) tablet 600 mg (600 mg Oral Given 07/16/19 1703)  tiZANidine (ZANAFLEX) tablet 4 mg (has no administration in time range)  azithromycin (ZITHROMAX) tablet 500 mg (500 mg Oral Given 07/16/19 0958)  albuterol (PROVENTIL) (2.5 MG/3ML) 0.083% nebulizer solution 2.5 mg (has no administration in time range)  atorvastatin (LIPITOR) tablet 20 mg (20 mg Oral Given 07/16/19 1703)  ethambutol (MYAMBUTOL) tablet 1,000 mg (has no administration in  time range)  ondansetron (ZOFRAN) tablet 4 mg (has no administration in time range)    Or  ondansetron (ZOFRAN) injection 4 mg (has no administration in time range)  acetaminophen (TYLENOL) tablet 650 mg (has no administration in time range)    Or  acetaminophen (TYLENOL) suppository 650 mg (has no administration in time range)  oxyCODONE-acetaminophen (PERCOCET/ROXICET) 5-325 MG per tablet 1 tablet (1 tablet Oral Given 07/16/19  1341)    And  oxyCODONE (Oxy IR/ROXICODONE) immediate release tablet 5 mg (5 mg Oral Given 07/16/19 0406)  carvedilol (COREG) tablet 12.5 mg (12.5 mg Oral Given 07/16/19 1703)  acetaminophen (TYLENOL) tablet 1,000 mg (1,000 mg Oral Given 07/15/19 1949)  iohexol (OMNIPAQUE) 350 MG/ML injection 80 mL (80 mLs Intravenous Contrast Given 07/16/19 0017)  ethambutol (MYAMBUTOL) tablet 1,000 mg (1,000 mg Oral Given 07/16/19 0959)  rifabutin (MYCOBUTIN) capsule 300 mg (300 mg Oral Given 07/16/19 0956)    Note:  This document was prepared using Dragon voice recognition software and may include unintentional dictation errors.  Nanda Quinton, MD, [facility] Emergency Medicine    Senetra Dillin, [facility], MD 07/16/19 [phone]

## 2019-07-16 ENCOUNTER — Other Ambulatory Visit: Payer: Self-pay

## 2019-07-16 ENCOUNTER — Emergency Department (HOSPITAL_COMMUNITY): Payer: HMO

## 2019-07-16 ENCOUNTER — Encounter (HOSPITAL_COMMUNITY): Payer: Self-pay | Admitting: Internal Medicine

## 2019-07-16 DIAGNOSIS — Z9981 Dependence on supplemental oxygen: Secondary | ICD-10-CM | POA: Diagnosis not present

## 2019-07-16 DIAGNOSIS — Z888 Allergy status to other drugs, medicaments and biological substances status: Secondary | ICD-10-CM

## 2019-07-16 DIAGNOSIS — I2510 Atherosclerotic heart disease of native coronary artery without angina pectoris: Secondary | ICD-10-CM | POA: Diagnosis present

## 2019-07-16 DIAGNOSIS — I4892 Unspecified atrial flutter: Secondary | ICD-10-CM | POA: Diagnosis present

## 2019-07-16 DIAGNOSIS — M545 Low back pain: Secondary | ICD-10-CM | POA: Diagnosis not present

## 2019-07-16 DIAGNOSIS — I5032 Chronic diastolic (congestive) heart failure: Secondary | ICD-10-CM | POA: Diagnosis present

## 2019-07-16 DIAGNOSIS — Z87891 Personal history of nicotine dependence: Secondary | ICD-10-CM

## 2019-07-16 DIAGNOSIS — Z8614 Personal history of Methicillin resistant Staphylococcus aureus infection: Secondary | ICD-10-CM | POA: Diagnosis not present

## 2019-07-16 DIAGNOSIS — I110 Hypertensive heart disease with heart failure: Secondary | ICD-10-CM | POA: Diagnosis present

## 2019-07-16 DIAGNOSIS — G2581 Restless legs syndrome: Secondary | ICD-10-CM | POA: Diagnosis present

## 2019-07-16 DIAGNOSIS — I10 Essential (primary) hypertension: Secondary | ICD-10-CM | POA: Diagnosis not present

## 2019-07-16 DIAGNOSIS — J449 Chronic obstructive pulmonary disease, unspecified: Secondary | ICD-10-CM | POA: Diagnosis present

## 2019-07-16 DIAGNOSIS — Z20822 Contact with and (suspected) exposure to covid-19: Secondary | ICD-10-CM | POA: Diagnosis present

## 2019-07-16 DIAGNOSIS — Z833 Family history of diabetes mellitus: Secondary | ICD-10-CM | POA: Diagnosis not present

## 2019-07-16 DIAGNOSIS — R509 Fever, unspecified: Secondary | ICD-10-CM | POA: Diagnosis not present

## 2019-07-16 DIAGNOSIS — Z90710 Acquired absence of both cervix and uterus: Secondary | ICD-10-CM | POA: Diagnosis not present

## 2019-07-16 DIAGNOSIS — I480 Paroxysmal atrial fibrillation: Secondary | ICD-10-CM | POA: Diagnosis present

## 2019-07-16 DIAGNOSIS — Z79899 Other long term (current) drug therapy: Secondary | ICD-10-CM | POA: Diagnosis not present

## 2019-07-16 DIAGNOSIS — R7881 Bacteremia: Secondary | ICD-10-CM | POA: Diagnosis not present

## 2019-07-16 DIAGNOSIS — R072 Precordial pain: Secondary | ICD-10-CM | POA: Diagnosis present

## 2019-07-16 DIAGNOSIS — Z881 Allergy status to other antibiotic agents status: Secondary | ICD-10-CM

## 2019-07-16 DIAGNOSIS — A310 Pulmonary mycobacterial infection: Principal | ICD-10-CM

## 2019-07-16 DIAGNOSIS — Z823 Family history of stroke: Secondary | ICD-10-CM | POA: Diagnosis not present

## 2019-07-16 DIAGNOSIS — J9611 Chronic respiratory failure with hypoxia: Secondary | ICD-10-CM | POA: Diagnosis present

## 2019-07-16 DIAGNOSIS — Z950 Presence of cardiac pacemaker: Secondary | ICD-10-CM | POA: Diagnosis not present

## 2019-07-16 DIAGNOSIS — Z95810 Presence of automatic (implantable) cardiac defibrillator: Secondary | ICD-10-CM

## 2019-07-16 DIAGNOSIS — I340 Nonrheumatic mitral (valve) insufficiency: Secondary | ICD-10-CM | POA: Diagnosis not present

## 2019-07-16 DIAGNOSIS — Z803 Family history of malignant neoplasm of breast: Secondary | ICD-10-CM | POA: Diagnosis not present

## 2019-07-16 DIAGNOSIS — Z86718 Personal history of other venous thrombosis and embolism: Secondary | ICD-10-CM | POA: Diagnosis not present

## 2019-07-16 DIAGNOSIS — Z8249 Family history of ischemic heart disease and other diseases of the circulatory system: Secondary | ICD-10-CM | POA: Diagnosis not present

## 2019-07-16 DIAGNOSIS — Z8711 Personal history of peptic ulcer disease: Secondary | ICD-10-CM | POA: Diagnosis not present

## 2019-07-16 DIAGNOSIS — Z8701 Personal history of pneumonia (recurrent): Secondary | ICD-10-CM | POA: Diagnosis not present

## 2019-07-16 DIAGNOSIS — M797 Fibromyalgia: Secondary | ICD-10-CM | POA: Diagnosis present

## 2019-07-16 LAB — URINALYSIS, ROUTINE W REFLEX MICROSCOPIC
Bilirubin Urine: NEGATIVE
Glucose, UA: NEGATIVE mg/dL
Ketones, ur: NEGATIVE mg/dL
Nitrite: NEGATIVE
Protein, ur: 30 mg/dL — AB
Specific Gravity, Urine: 1.044 — ABNORMAL HIGH (ref 1.005–1.030)
WBC, UA: >50 WBC/hpf — ABNORMAL HIGH (ref 0–5)
pH: 5 (ref 5.0–8.0)

## 2019-07-16 LAB — BASIC METABOLIC PANEL
Anion gap: 8 (ref 5–15)
BUN: 24 mg/dL — ABNORMAL HIGH (ref 8–23)
CO2: 29 mmol/L (ref 22–32)
Calcium: 7.5 mg/dL — ABNORMAL LOW (ref 8.9–10.3)
Chloride: 100 mmol/L (ref 98–111)
Creatinine, Ser: 1.22 mg/dL — ABNORMAL HIGH (ref 0.44–1.00)
GFR calc Af Amer: 51 mL/min — ABNORMAL LOW (ref >60)
GFR calc non Af Amer: 44 mL/min — ABNORMAL LOW (ref >60)
Glucose, Bld: 115 mg/dL — ABNORMAL HIGH (ref 70–99)
Potassium: 4.5 mmol/L (ref 3.5–5.1)
Sodium: 137 mmol/L (ref 135–145)

## 2019-07-16 LAB — CBC
HCT: 30.4 % — ABNORMAL LOW (ref 36.0–46.0)
Hemoglobin: 9.1 g/dL — ABNORMAL LOW (ref 12.0–15.0)
MCH: 28 pg (ref 26.0–34.0)
MCHC: 29.9 g/dL — ABNORMAL LOW (ref 30.0–36.0)
MCV: 93.5 fL (ref 80.0–100.0)
Platelets: 154 10*3/uL (ref 150–400)
RBC: 3.25 MIL/uL — ABNORMAL LOW (ref 3.87–5.11)
RDW: 15.5 % (ref 11.5–15.5)
WBC: 4 10*3/uL (ref 4.0–10.5)
nRBC: 0 % (ref 0.0–0.2)

## 2019-07-16 LAB — TROPONIN I (HIGH SENSITIVITY): Troponin I (High Sensitivity): 19 ng/L — ABNORMAL HIGH (ref <18)

## 2019-07-16 IMAGING — CT CT LUMBAR SPINE WITH CONTRAST
1 of 6 series · 6 of 14 positions shown, 8 images · non-contrast
Comparison: Lumbar spine x-rays dated June 10, 2018. CT abdomen
pelvis dated June 21, 2017.

CLINICAL DATA: Chronic left-sided low back pain radiating to the
left hip. No leg pain. No prior surgery.
TECHNIQUE: Contiguous axial images were obtained through the lumbar spine after
the intrathecal infusion of contrast. Coronal and sagittal
reconstructions were obtained of the axial image sets.

[Series 3: l spine soft · axial · 0.27mm/px · z∈[-206,-50]mm · 6 of 74 slices shown, 8 images]
[im 11/74  soft-tissue]
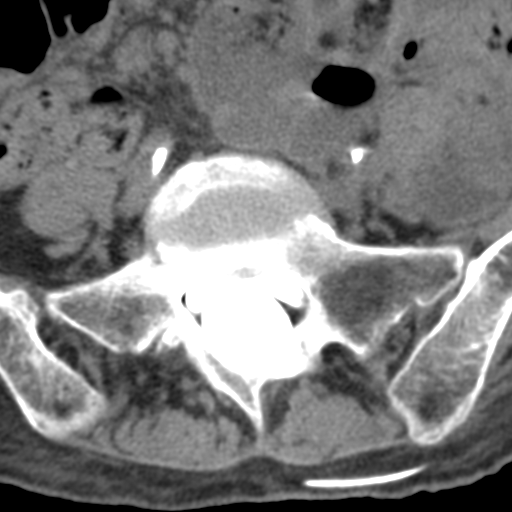
[im 11/74  bone]
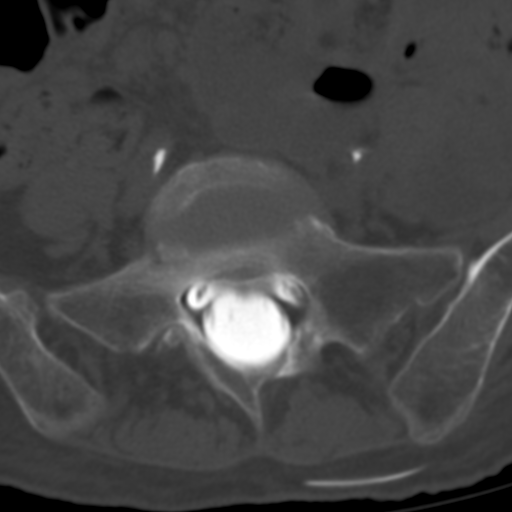
[im 21/74  bone]
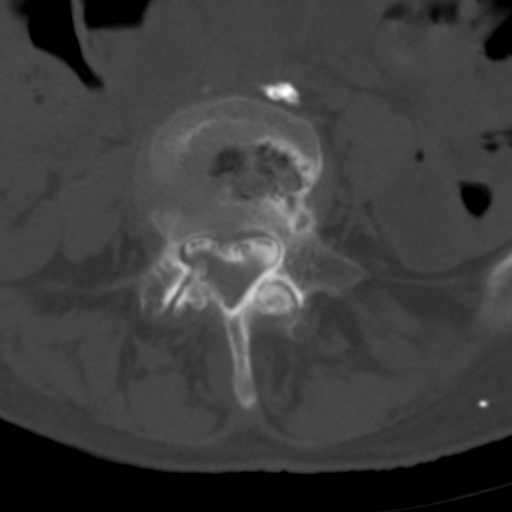
[im 32/74  bone]
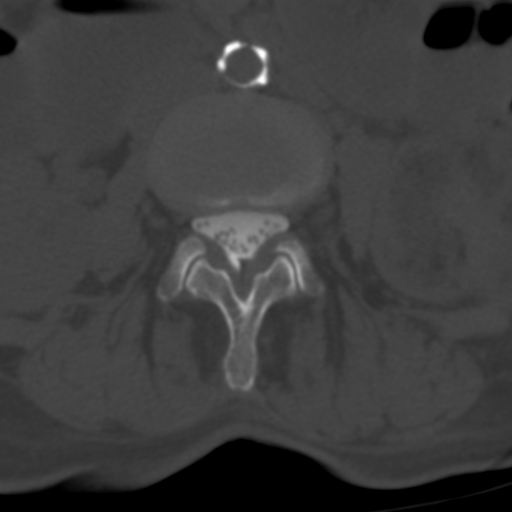
[im 42/74  bone]
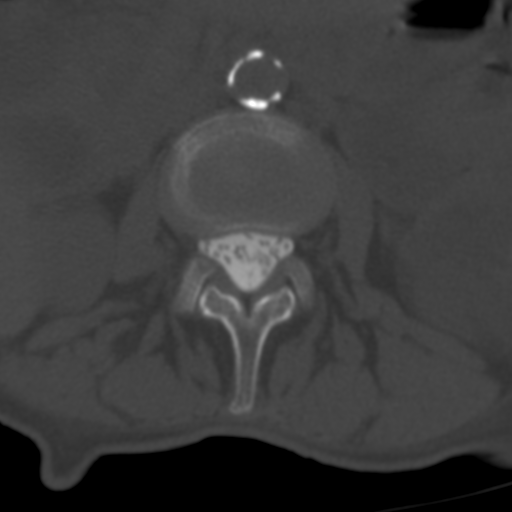
[im 53/74  soft-tissue]
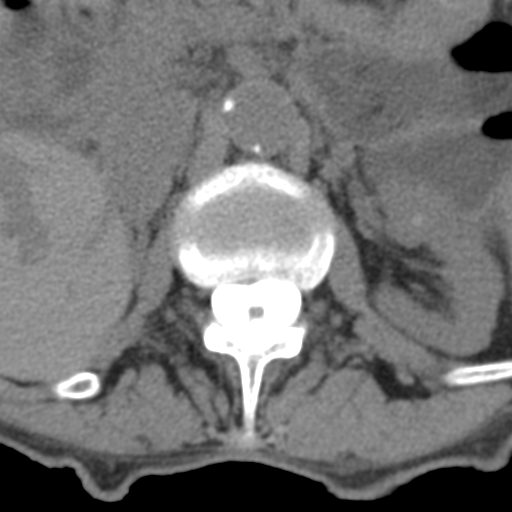
[im 53/74  bone]
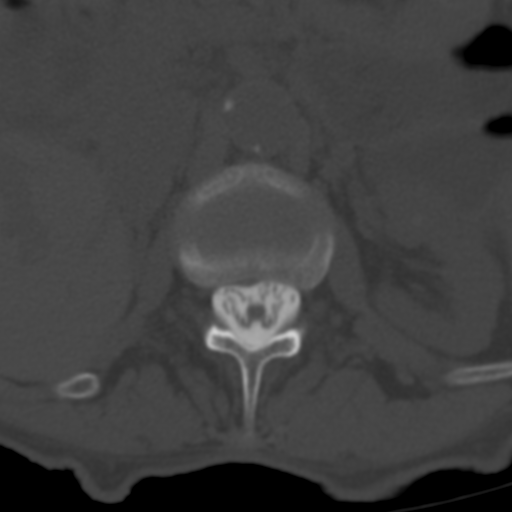
[im 63/74  bone]
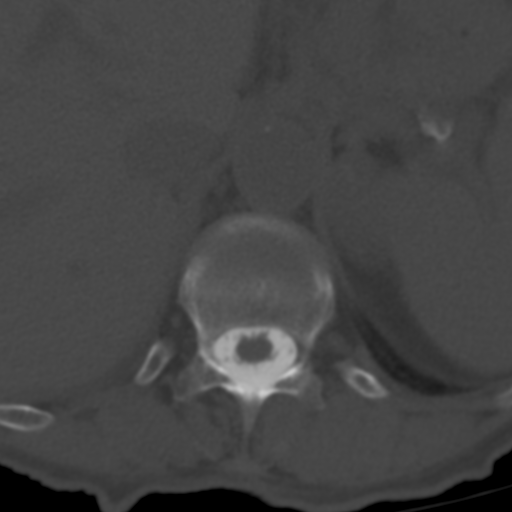

[6 of 14 positions shown; findings below may reference images not displayed]

EXAM:
LUMBAR MYELOGRAM

CT LUMBAR MYELOGRAM

FLUOROSCOPY TIME:  Radiation Exposure Index (as provided by the
fluoroscopic device): 14.3 mGy

Fluoroscopy Time:  1 minutes, 19 seconds

Number of Acquired Images:  15

PROCEDURE:
After thorough discussion of risks and benefits of the procedure
including bleeding, infection, injury to nerves, blood vessels,
adjacent structures as well as headache and CSF leak, written and
oral informed consent was obtained. Consent was obtained by Dr.
Nikocevic Mujagic. Time out form was completed.

Patient was positioned prone on the fluoroscopy table. Local
anesthesia was provided with 1% lidocaine without epinephrine after
prepped and draped in the usual sterile fashion. Puncture was
performed at L3-L4 using a 3 1/2 inch 22-gauge spinal needle via
right interlaminar approach. Using a single pass through the dura,
the needle was placed within the thecal sac, with return of clear
CSF. 15 mL of Isovue M-RDD was injected into the thecal sac, with
normal opacification of the nerve roots and cauda equina consistent
with free flow within the subarachnoid space.

I personally performed the lumbar puncture and administered the
intrathecal contrast. I also personally supervised acquisition of
the myelogram images.
FINDINGS: LUMBAR MYELOGRAM FINDINGS:

10 mm anterolisthesis at L4-L5 when prone, not significantly changed
with standing, flexion, or extension. Prominent ventral extradural
defect at L4-L5. Small ventral extradural defects at T12-L1 and
L1-L2. Severe spinal canal stenosis at L4-L5 when prone, improved
with standing, flexion, and extension. No nerve root effacement.

CT LUMBAR MYELOGRAM FINDINGS:

Segmentation: Standard.

Alignment: Unchanged 7 mm anterolisthesis at L4-L5.

Vertebrae: No acute fracture or other focal pathologic process.

Conus medullaris and cauda equina: Conus extends to the L1-L2 level.
Conus and cauda equina appear normal.

Paraspinal and other soft tissues: Scarring at the right lung base.
Mild central intrahepatic and common bile duct dilatation is likely
due to post cholecystectomy state. Aortoiliac atherosclerotic
vascular disease.

Disc levels:

T11-T12: Negative.

T12-L1:  Small right paracentral disc protrusion.  No stenosis.

L1-L2:  Small right paracentral disc protrusion.  No stenosis.

L2-L3:  Negative disc.  Ligamentum flavum hypertrophy.  No stenosis.

L3-L4:  Negative disc.  Ligamentum flavum hypertrophy.  No stenosis.

L4-L5: Disc uncovering with diffuse disc bulging. Moderate bilateral
facet arthropathy with prominent ligamentum flavum hypertrophy.
Moderate spinal canal stenosis. Severe left and mild right lateral
recess stenosis. Mild to moderate left and mild right neuroforaminal
stenosis.

L5-S1: Small shallow broad-based posterior disc protrusion. Mild
bilateral facet arthropathy. No stenosis.
IMPRESSION: 1. 10 mm anterolisthesis at L4-L5 when prone, not significantly
changed with dynamic maneuvers, but increased from 7 mm when supine.
2. Severe spinal canal stenosis at L4-L5 when prone, moderate when
standing or supine.
3. Severe left lateral recess stenosis at L4-L5 with impingement of
the descending left L4 nerve root.

## 2019-07-16 IMAGING — XA DG MYELOGRAPHY LUMBAR INJ LUMBOSACRAL
7 of 18 series · 7 of 18 positions shown · non-contrast
Comparison: Lumbar spine x-rays dated June 10, 2018. CT abdomen
pelvis dated June 21, 2017.

CLINICAL DATA: Chronic left-sided low back pain radiating to the
left hip. No leg pain. No prior surgery.
TECHNIQUE: Contiguous axial images were obtained through the lumbar spine after
the intrathecal infusion of contrast. Coronal and sagittal
reconstructions were obtained of the axial image sets.

[Series 1: vasc standard · 1 of 1 slices shown (1 of 4)]
[im 1/1]
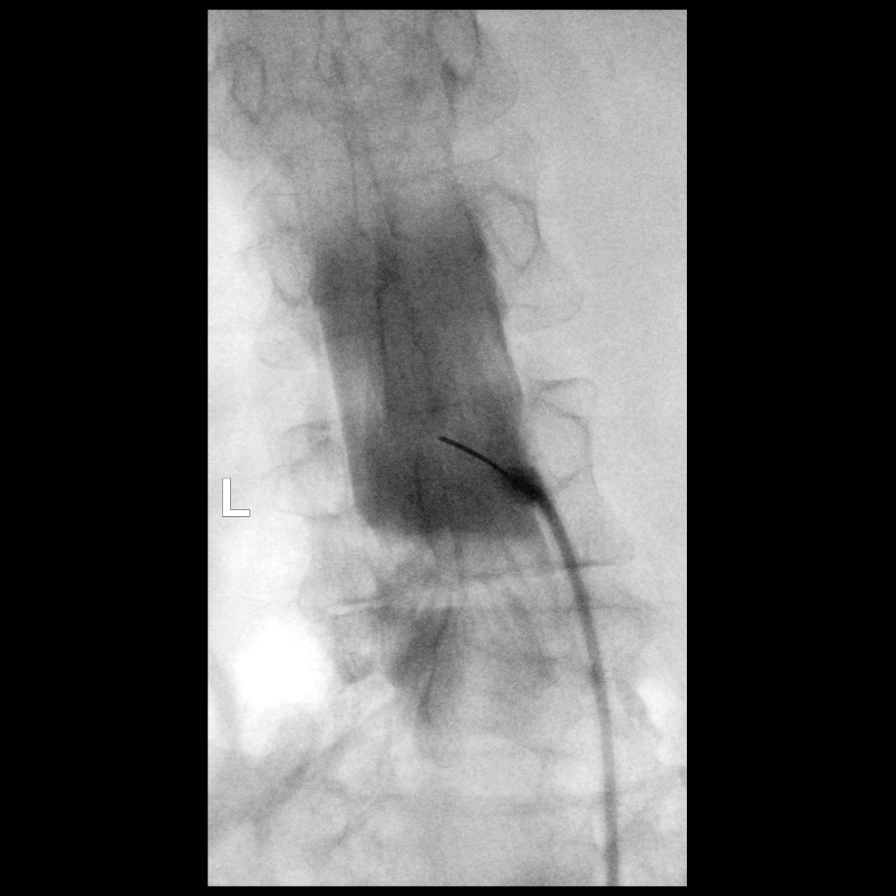

[Series 1: w lumbar spine lat · 0.15mm/px · 1 of 1 slices shown]
[im 1/1]
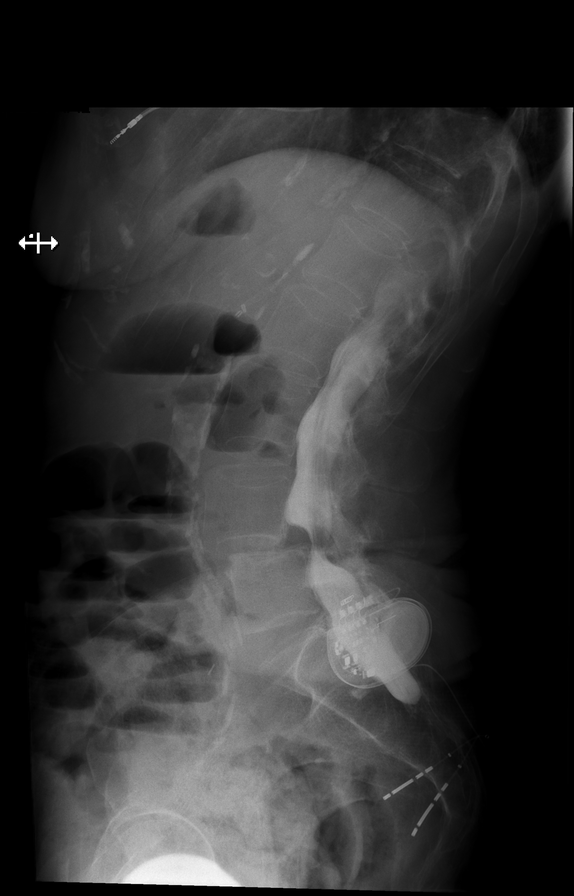

[Series 2: vasc standard · 1 of 1 slices shown (2 of 4)]
[im 1/1]
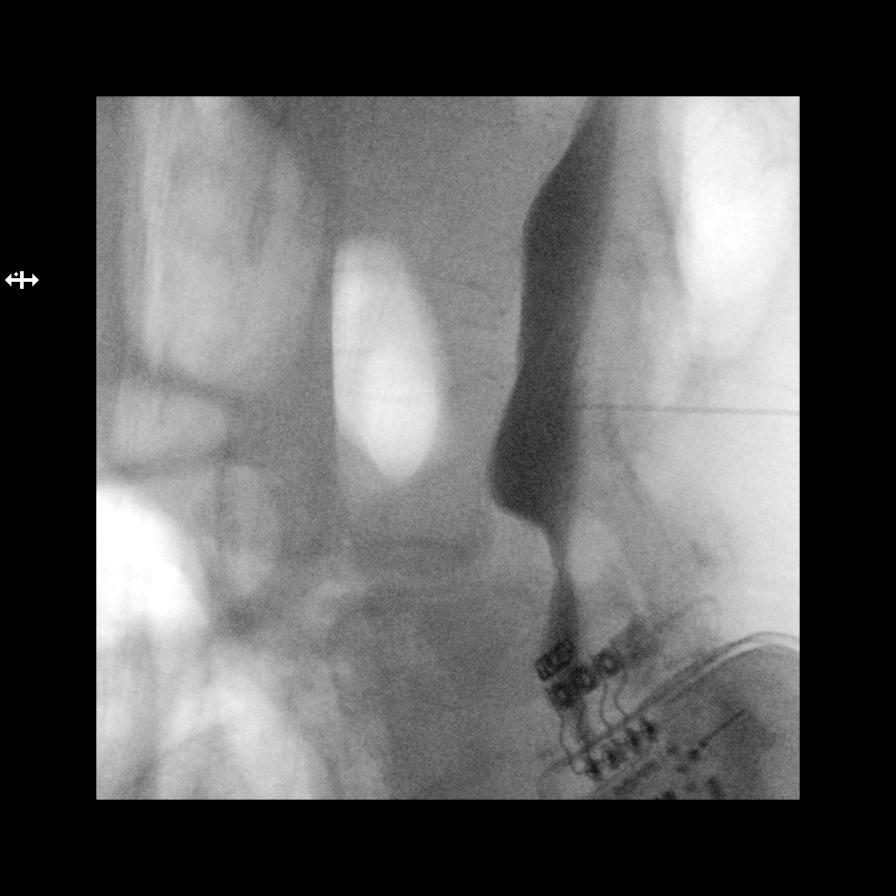

[Series 3: vasc standard · 1 of 1 slices shown (3 of 4)]
[im 1/1]
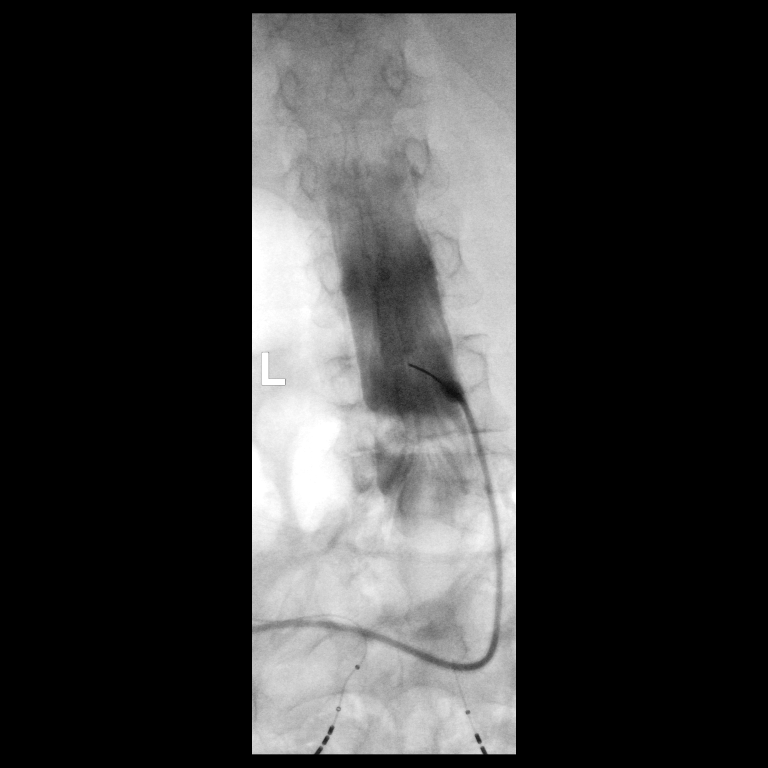

[Series 3: w lumbar spine flexion · 0.15mm/px · 1 of 1 slices shown]
[im 1/1]
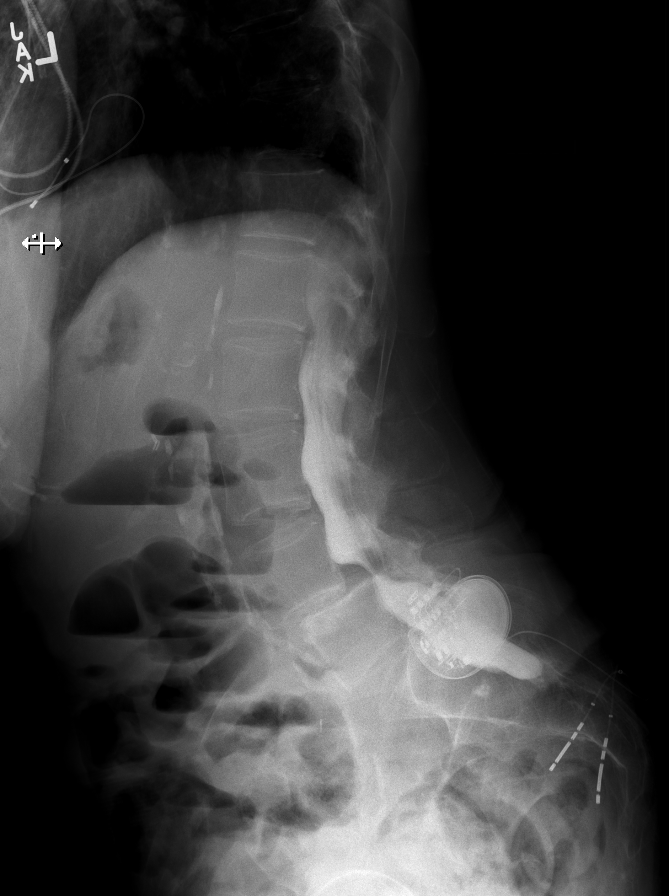

[Series 4: w lumbar spine extension · 0.15mm/px · 1 of 1 slices shown]
[im 1/1]
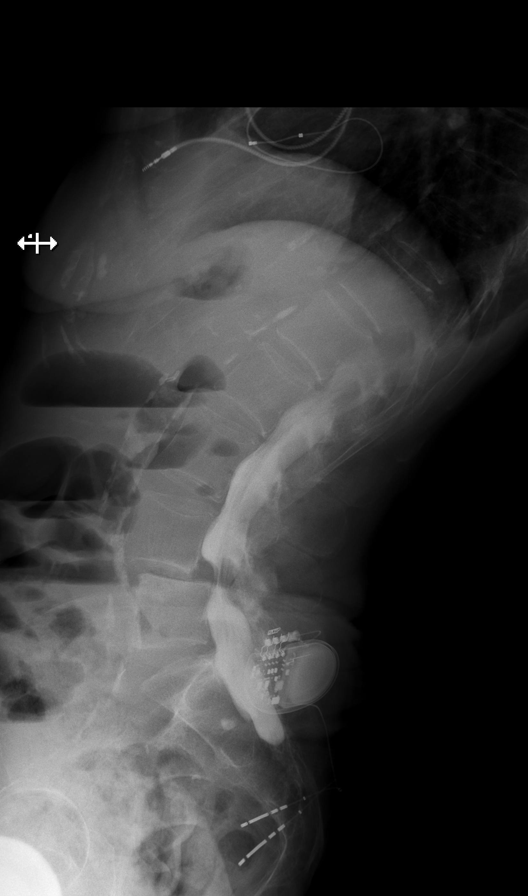

[Series 4: vasc standard · 1 of 1 slices shown (4 of 4)]
[im 1/1]
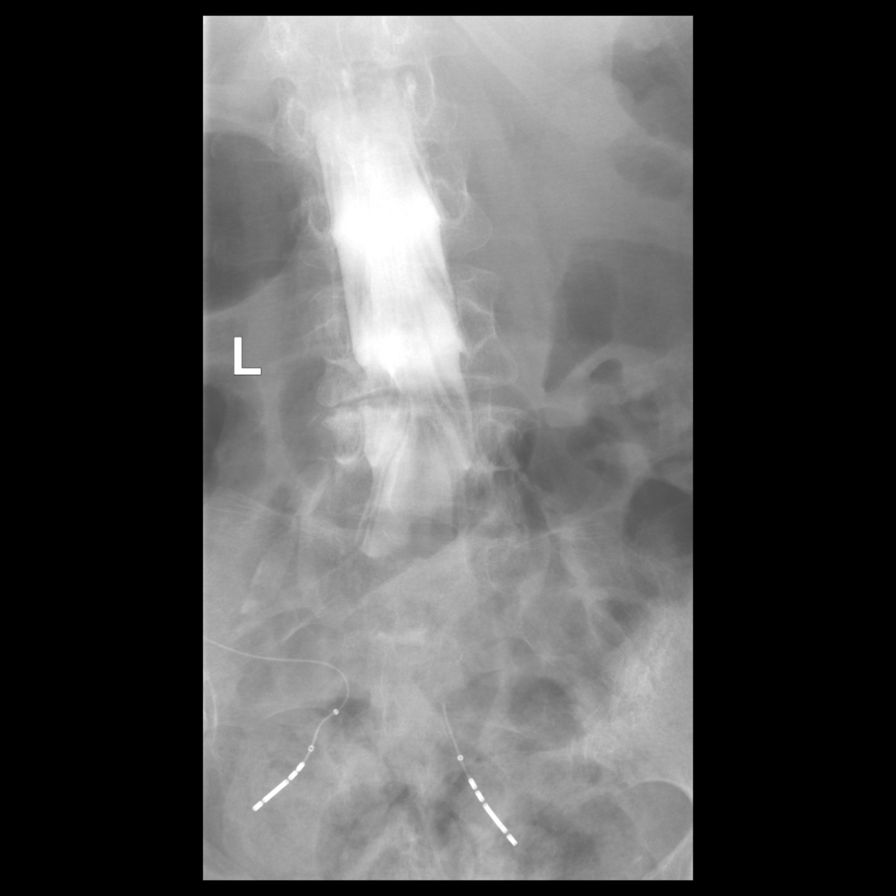

[7 of 18 positions shown; findings below may reference images not displayed]

EXAM:
LUMBAR MYELOGRAM

CT LUMBAR MYELOGRAM

FLUOROSCOPY TIME:  Radiation Exposure Index (as provided by the
fluoroscopic device): 14.3 mGy

Fluoroscopy Time:  1 minutes, 19 seconds

Number of Acquired Images:  15

PROCEDURE:
After thorough discussion of risks and benefits of the procedure
including bleeding, infection, injury to nerves, blood vessels,
adjacent structures as well as headache and CSF leak, written and
oral informed consent was obtained. Consent was obtained by Dr.
Nikocevic Mujagic. Time out form was completed.

Patient was positioned prone on the fluoroscopy table. Local
anesthesia was provided with 1% lidocaine without epinephrine after
prepped and draped in the usual sterile fashion. Puncture was
performed at L3-L4 using a 3 1/2 inch 22-gauge spinal needle via
right interlaminar approach. Using a single pass through the dura,
the needle was placed within the thecal sac, with return of clear
CSF. 15 mL of Isovue M-RDD was injected into the thecal sac, with
normal opacification of the nerve roots and cauda equina consistent
with free flow within the subarachnoid space.

I personally performed the lumbar puncture and administered the
intrathecal contrast. I also personally supervised acquisition of
the myelogram images.
FINDINGS: LUMBAR MYELOGRAM FINDINGS:

10 mm anterolisthesis at L4-L5 when prone, not significantly changed
with standing, flexion, or extension. Prominent ventral extradural
defect at L4-L5. Small ventral extradural defects at T12-L1 and
L1-L2. Severe spinal canal stenosis at L4-L5 when prone, improved
with standing, flexion, and extension. No nerve root effacement.

CT LUMBAR MYELOGRAM FINDINGS:

Segmentation: Standard.

Alignment: Unchanged 7 mm anterolisthesis at L4-L5.

Vertebrae: No acute fracture or other focal pathologic process.

Conus medullaris and cauda equina: Conus extends to the L1-L2 level.
Conus and cauda equina appear normal.

Paraspinal and other soft tissues: Scarring at the right lung base.
Mild central intrahepatic and common bile duct dilatation is likely
due to post cholecystectomy state. Aortoiliac atherosclerotic
vascular disease.

Disc levels:

T11-T12: Negative.

T12-L1:  Small right paracentral disc protrusion.  No stenosis.

L1-L2:  Small right paracentral disc protrusion.  No stenosis.

L2-L3:  Negative disc.  Ligamentum flavum hypertrophy.  No stenosis.

L3-L4:  Negative disc.  Ligamentum flavum hypertrophy.  No stenosis.

L4-L5: Disc uncovering with diffuse disc bulging. Moderate bilateral
facet arthropathy with prominent ligamentum flavum hypertrophy.
Moderate spinal canal stenosis. Severe left and mild right lateral
recess stenosis. Mild to moderate left and mild right neuroforaminal
stenosis.

L5-S1: Small shallow broad-based posterior disc protrusion. Mild
bilateral facet arthropathy. No stenosis.
IMPRESSION: 1. 10 mm anterolisthesis at L4-L5 when prone, not significantly
changed with dynamic maneuvers, but increased from 7 mm when supine.
2. Severe spinal canal stenosis at L4-L5 when prone, moderate when
standing or supine.
3. Severe left lateral recess stenosis at L4-L5 with impingement of
the descending left L4 nerve root.

## 2019-07-16 MED ORDER — CARVEDILOL 12.5 MG PO TABS
12.5000 mg | ORAL_TABLET | Freq: Two times a day (BID) | ORAL | Status: DC
  Administered 2019-07-16 – 2019-07-18 (×5): 12.5 mg via ORAL
  Filled 2019-07-16 (×5): qty 1

## 2019-07-16 MED ORDER — SODIUM CHLORIDE 0.9% FLUSH: 10.0000 mL | INTRAVENOUS | Status: DC | PRN

## 2019-07-16 MED ORDER — PANTOPRAZOLE SODIUM 40 MG PO TBEC
40.0000 mg | DELAYED_RELEASE_TABLET | Freq: Two times a day (BID) | ORAL | Status: DC
  Administered 2019-07-16 – 2019-07-18 (×5): 40 mg via ORAL
  Filled 2019-07-16 (×5): qty 1

## 2019-07-16 MED ORDER — IOHEXOL 350 MG/ML SOLN
80.0000 mL | Freq: Once | INTRAVENOUS | Status: AC | PRN
  Administered 2019-07-16: 80 mL via INTRAVENOUS

## 2019-07-16 MED ORDER — AZITHROMYCIN 500 MG PO TABS
500.0000 mg | ORAL_TABLET | ORAL | Status: DC
  Administered 2019-07-16: 500 mg via ORAL
  Filled 2019-07-16: qty 2

## 2019-07-16 MED ORDER — ETHAMBUTOL HCL 400 MG PO TABS
1000.0000 mg | ORAL_TABLET | Freq: Once | ORAL | Status: AC
  Administered 2019-07-16: 1000 mg via ORAL
  Filled 2019-07-16: qty 2

## 2019-07-16 MED ORDER — ETHAMBUTOL HCL 100 MG PO TABS: 200.0000 mg | ORAL_TABLET | ORAL | Status: DC

## 2019-07-16 MED ORDER — ETHAMBUTOL HCL 400 MG PO TABS
1000.0000 mg | ORAL_TABLET | ORAL | Status: DC
  Administered 2019-07-17: 1000 mg via ORAL
  Filled 2019-07-16: qty 2

## 2019-07-16 MED ORDER — ONDANSETRON HCL 4 MG/2ML IJ SOLN
4.0000 mg | Freq: Four times a day (QID) | INTRAMUSCULAR | Status: DC | PRN
  Administered 2019-07-17: 4 mg via INTRAVENOUS
  Filled 2019-07-16: qty 2

## 2019-07-16 MED ORDER — TIZANIDINE HCL 2 MG PO TABS: 4.0000 mg | ORAL_TABLET | Freq: Three times a day (TID) | ORAL | Status: DC | PRN

## 2019-07-16 MED ORDER — ACETAMINOPHEN 650 MG RE SUPP: 650.0000 mg | Freq: Four times a day (QID) | RECTAL | Status: DC | PRN

## 2019-07-16 MED ORDER — ZOLPIDEM TARTRATE 5 MG PO TABS
5.0000 mg | ORAL_TABLET | Freq: Every evening | ORAL | Status: DC | PRN
  Administered 2019-07-16 – 2019-07-17 (×2): 5 mg via ORAL
  Filled 2019-07-16 (×2): qty 1

## 2019-07-16 MED ORDER — UMECLIDINIUM BROMIDE 62.5 MCG/INH IN AEPB
1.0000 | INHALATION_SPRAY | Freq: Every day | RESPIRATORY_TRACT | Status: DC
  Administered 2019-07-16 – 2019-07-18 (×2): 1 via RESPIRATORY_TRACT
  Filled 2019-07-16: qty 7

## 2019-07-16 MED ORDER — RIFABUTIN 150 MG PO CAPS
300.0000 mg | ORAL_CAPSULE | ORAL | Status: DC
  Filled 2019-07-16: qty 2

## 2019-07-16 MED ORDER — OXYCODONE HCL 5 MG PO TABS
5.0000 mg | ORAL_TABLET | ORAL | Status: DC | PRN
  Administered 2019-07-16 – 2019-07-18 (×9): 5 mg via ORAL
  Filled 2019-07-16 (×9): qty 1

## 2019-07-16 MED ORDER — OXYCODONE-ACETAMINOPHEN 10-325 MG PO TABS: 1.0000 | ORAL_TABLET | ORAL | Status: DC | PRN

## 2019-07-16 MED ORDER — CHLORHEXIDINE GLUCONATE CLOTH 2 % EX PADS
6.0000 | MEDICATED_PAD | Freq: Every day | CUTANEOUS | Status: DC
  Administered 2019-07-17 – 2019-07-18 (×2): 6 via TOPICAL

## 2019-07-16 MED ORDER — ACETAMINOPHEN 325 MG PO TABS: 650.0000 mg | ORAL_TABLET | Freq: Four times a day (QID) | ORAL | Status: DC | PRN

## 2019-07-16 MED ORDER — RIFABUTIN 150 MG PO CAPS
300.0000 mg | ORAL_CAPSULE | Freq: Once | ORAL | Status: AC
  Administered 2019-07-16: 10:00:00 300 mg via ORAL
  Filled 2019-07-16: qty 2

## 2019-07-16 MED ORDER — ALBUTEROL SULFATE (2.5 MG/3ML) 0.083% IN NEBU: 2.5000 mg | INHALATION_SOLUTION | Freq: Four times a day (QID) | RESPIRATORY_TRACT | Status: DC | PRN

## 2019-07-16 MED ORDER — GABAPENTIN 600 MG PO TABS
600.0000 mg | ORAL_TABLET | Freq: Three times a day (TID) | ORAL | Status: DC
  Administered 2019-07-16 – 2019-07-17 (×6): 600 mg via ORAL
  Filled 2019-07-16 (×10): qty 1

## 2019-07-16 MED ORDER — ONDANSETRON HCL 4 MG PO TABS
4.0000 mg | ORAL_TABLET | Freq: Four times a day (QID) | ORAL | Status: DC | PRN
  Administered 2019-07-18 (×2): 4 mg via ORAL
  Filled 2019-07-16 (×3): qty 1

## 2019-07-16 MED ORDER — OXYCODONE-ACETAMINOPHEN 5-325 MG PO TABS
1.0000 | ORAL_TABLET | ORAL | Status: DC | PRN
  Administered 2019-07-16 – 2019-07-18 (×10): 1 via ORAL
  Filled 2019-07-16 (×10): qty 1

## 2019-07-16 MED ORDER — ATORVASTATIN CALCIUM 10 MG PO TABS
20.0000 mg | ORAL_TABLET | Freq: Every evening | ORAL | Status: DC
  Administered 2019-07-16 – 2019-07-17 (×2): 20 mg via ORAL
  Filled 2019-07-16 (×2): qty 2

## 2019-07-16 MED ORDER — ETHAMBUTOL HCL 400 MG PO TABS: 800.0000 mg | ORAL_TABLET | ORAL | Status: DC

## 2019-07-16 NOTE — Consult Note (Signed)
Date of Admission:  07/15/2019          Reason for Consult: Fevers    Referring Provider: Dr. Cruzita Lederer   Assessment:  1. Fevers likely due to  Generator pocket infection possible AICD infection 2. Mycobacterium avium pulmonary  Infection 3. Low back pain   Plan:  1. Would consult EP re I and D of hematoma (more likely abscess), my understanding they are planning TEE as well 2. Followup blood cultures 3. OK to continue chronic M avium antibiotics 4.   Principal Problem:   MAI (mycobacterium avium-intracellulare) infection ([place]) Active Problems:   Essential hypertension   Atrial fibrillation/flutter   Chronic hypoxemic respiratory failure (HCC)   Chronic diastolic CHF (congestive heart failure) (HCC)   Fever   Scheduled Meds: . atorvastatin  20 mg Oral QPM  . azithromycin  500 mg Oral Once per day on Tue Thu Sat  . carvedilol  12.5 mg Oral BID WC  . Chlorhexidine Gluconate Cloth  6 each Topical Daily  . [START ON 07/17/2019] ethambutol  1,000 mg Oral Q M,W,F  . gabapentin  600 mg Oral TID AC & HS  . pantoprazole  40 mg Oral BID AC  . [START ON 07/17/2019] rifabutin  300 mg Oral Q M,W,F  . umeclidinium bromide  1 puff Inhalation Daily   Continuous Infusions: PRN Meds:.acetaminophen **OR** acetaminophen, albuterol, fentaNYL (SUBLIMAZE) injection, ondansetron **OR** ondansetron (ZOFRAN) IV, oxyCODONE-acetaminophen **AND** oxyCODONE, sodium chloride flush, tiZANidine, zolpidem  HPI: [name] is a 74 y.o. female with past medical history significant for COPD on 3 L of nasal cannula at home, paroxysmal atrial fibrillation, AICD PPM, port in place who was recently diagnosed with Mycobacterium AVM pulmonary infection and started on 3 drug therapy by my partner Dr. Linus Salmons with ethambutol rifabutin and azithromycin.  She has tolerated his medications without difficulties and has possibly had slight improvement in her cough.  Certainly cough has not worsened.  On 2 April  she also underwent biventricular pacemaker generator change out due to the device and is at end-of-life.  Since then she has been suffering from a hematoma that has been watched by Dr Olin Pia team.  In the past few days she had progressive symptoms of weakness and then developed a fever up to 103 degrees.  She is brought to the hospital where she was also found to be febrile in the ER.  CT scan of the lungs shows stability of her chronic and AVM findings.  Blood cultures have been drawn she has been continued on therapy for Mycobacterium avium.  Given her recent intervention and her obvious swelling around her ICD generator site, it would seem to be the most likely culprit for cause of her fever would be a generator pocket infection.  Possibly a deeper infection.  So far fortunately her blood cultures have been sterile.  Would strongly recommend getting in touch with electrophysiology so that they can I&D the generator pocket and sent for culture.  My understanding is there also did perform a transesophageal echocardiogram.  We will follow closely   Review of Systems: Review of Systems  Constitutional: Positive for chills, fever and malaise/fatigue. Negative for weight loss.  HENT: Negative for congestion and sore throat.   Eyes: Negative for blurred vision and photophobia.  Respiratory: Positive for cough and shortness of breath. Negative for wheezing.   Cardiovascular: Negative for chest pain, palpitations and leg swelling.  Gastrointestinal: Negative for abdominal pain, blood in stool, constipation, diarrhea, heartburn,  melena, nausea and vomiting.  Genitourinary: Negative for dysuria, flank pain and hematuria.  Musculoskeletal: Positive for myalgias. Negative for back pain, falls and joint pain.  Skin: Negative for itching and rash.  Neurological: Positive for dizziness and weakness. Negative for focal weakness, loss of consciousness and headaches.  Endo/Heme/Allergies: Does not  bruise/bleed easily.  Psychiatric/Behavioral: Negative for depression and suicidal ideas. The patient does not have insomnia.     Past Medical History:  Diagnosis Date  . Anemia   . Arthritis    right hip  . Atrial fibrillation ([place])   . CAD (coronary artery disease)    mild disease per cath in 2011  . CHF (congestive heart failure) ([place])   . COPD (chronic obstructive pulmonary disease) (HCC)    continuous O2- 2l  . Diverticulitis   . Elbow fracture, left aug 2012  . Erythropoietin deficiency anemia 06/19/2018  . Fibromyalgia    COUPLE OF TIMES A YEAR-PT HAS EPISODES OF CONFUSION-USUALLY INVOLVES DAY/NIGHT REVERSAL AND EPISODES OF TWITCHING AND FALLS--SEES DR. Jacelyn Grip - NEUROLOGIST-LAST SEEN 07/10/11  . Gastroparesis   . GERD (gastroesophageal reflux disease)   . H/O: GI bleed    from Pradaxa  . Hepatitis    hx of in high school   . HTN (hypertension)    under control; has been on med. x "years"  . Hx MRSA infection   . Hx of gastric ulcer   . Hx of stress fx aug 2011   right hip   . Iron malabsorption 08/14/2017  . LBBB (left bundle branch block)   . Oxygen dependent    2 liters via nasal cannula at all times  . Pacemaker    CRT therapy; followed by Dr. Caryl Comes  . Pleural effusion     s/p right thoracentesis 03/09  . Pneumonia - 03/09, 12/09, 02/10, 06/10   MOST RECENT FEB 2013  . Primary dilated cardiomyopathy (HCC)    EF 45 to 50% per echo in Jan 2012  . RLS (restless legs syndrome)   . Silent aspiration   . Small bowel obstruction ([place])   . Urinary incontinence     -INTERSTIM IMPLANT NOT FUNCTIONING PER PT  . 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 Vitamin B12 deficiency   . Wears glasses    reading    Social History   Tobacco Use  . Smoking status: Former Smoker    Packs/day: 1.00    Years: 20.00    Pack years: 20.00    Quit date: 03/19/1986    Years since quitting: 33.3  . Smokeless tobacco: Never  Used  Substance Use Topics  . Alcohol use: No    Alcohol/week: 0.0 standard drinks  . Drug use: No    Family History  Problem Relation Age of Onset  . Heart disease Father   . Stroke Mother   . Heart disease Mother   . Heart disease Brother   . Breast cancer Maternal Aunt   . Heart disease Paternal Aunt   . Diabetes Paternal Grandfather   . Colon cancer Neg Hx    Allergies  Allergen Reactions  . Dabigatran Etexilate Mesylate Other (See Comments)    INTERNAL BLEEDING-pradaxa  . Augmentin [Amoxicillin-Pot Clavulanate] Diarrhea and Nausea Only    Did it involve swelling of the face/tongue/throat, SOB, or low BP? No Did it involve sudden or severe rash/hives, skin peeling, or any reaction on the inside of your mouth or  nose? No Did you need to seek medical attention at a hospital or doctor's office? No When did it last happen?02/2019 If all above answers are "NO", may proceed with cephalosporin use.   [name] [Pentazocine] Other (See Comments)    Hallucinations     OBJECTIVE: Blood pressure (!) 93/54, pulse 73, temperature 98 F (36.7 C), resp. rate 16, height 5' (1.524 m), weight 40.8 kg, SpO2 96 %.  Physical Exam Constitutional:      General: She is not in acute distress.    Appearance: Normal appearance. She is well-developed. She is not ill-appearing or diaphoretic.  HENT:     Head: Normocephalic and atraumatic.     Right Ear: Hearing and external ear normal.     Left Ear: Hearing and external ear normal.     Nose: No nasal deformity or rhinorrhea.  Eyes:     General: No scleral icterus.    Extraocular Movements: Extraocular movements intact.     Conjunctiva/sclera: Conjunctivae normal.     Right eye: Right conjunctiva is not injected.     Left eye: Left conjunctiva is not injected.     Pupils: Pupils are equal, round, and reactive to light.  Neck:     Vascular: No JVD.  Cardiovascular:     Rate and Rhythm: Normal rate and regular rhythm.     Heart sounds:  Normal heart sounds, S1 normal and S2 normal. No murmur. No systolic murmur. No friction rub. No gallop.   Pulmonary:     Effort: Pulmonary effort is normal. No tachypnea, accessory muscle usage or respiratory distress.     Breath sounds: No wheezing or rhonchi.  Chest:     Chest wall: Tenderness present.  Abdominal:     General: Bowel sounds are normal. There is no distension.     Palpations: Abdomen is soft.     Tenderness: There is no abdominal tenderness.  Musculoskeletal:        General: Normal range of motion.     Right shoulder: Normal.     Left shoulder: Normal.     Cervical back: Normal range of motion and neck supple.     Right hip: Normal.     Left hip: Normal.     Right knee: Normal.     Left knee: Normal.  Lymphadenopathy:     Head:     Right side of head: No submandibular, preauricular or posterior auricular adenopathy.     Left side of head: No submandibular, preauricular or posterior auricular adenopathy.     Cervical: No cervical adenopathy.     Right cervical: No superficial or deep cervical adenopathy.    Left cervical: No superficial or deep cervical adenopathy.  Skin:    General: Skin is warm and dry.     Coloration: Skin is not pale.     Findings: No abrasion, bruising, ecchymosis, erythema, lesion or rash.     Nails: There is no clubbing.  Neurological:     General: No focal deficit present.     Mental Status: She is alert and oriented to person, place, and time.     Sensory: No sensory deficit.     Coordination: Coordination normal.     Gait: Gait normal.  Psychiatric:        Attention and Perception: She is attentive.        Mood and Affect: Mood normal.        Speech: Speech normal.        Behavior:  Behavior normal. Behavior is cooperative.        Thought Content: Thought content normal.        Judgment: Judgment normal.    Port Is  Clean  ICD pocket 07/16/2019:      Lab Results Lab Results  Component Value Date   WBC 4.7 07/15/2019    HGB 11.3 (L) 07/15/2019   HCT 37.7 07/15/2019   MCV 94.5 07/15/2019   PLT 163 07/15/2019    Lab Results  Component Value Date   CREATININE 0.98 07/15/2019   BUN 21 07/15/2019   NA 144 07/15/2019   K 4.1 07/15/2019   CL 104 07/15/2019   CO2 25 07/15/2019    Lab Results  Component Value Date   ALT 10 07/15/2019   AST 28 07/15/2019   ALKPHOS 67 07/15/2019   BILITOT 0.6 07/15/2019     Microbiology: Recent Results (from the past 240 hour(s))  Blood Culture (routine x 2)     Status: None (Preliminary result)   Collection Time: 07/15/19  7:21 PM   Specimen: BLOOD RIGHT FOREARM  Result Value Ref Range Status   Specimen Description BLOOD RIGHT FOREARM  Final   Special Requests   Final    BOTTLES DRAWN AEROBIC ONLY Blood Culture adequate volume   Culture   Final    NO GROWTH < 12 HOURS Performed at [facility], [place] [address], [place], [place] [address]    Report Status PENDING  Incomplete  Blood Culture (routine x 2)     Status: None (Preliminary result)   Collection Time: 07/15/19  7:32 PM   Specimen: BLOOD LEFT HAND  Result Value Ref Range Status   Specimen Description BLOOD LEFT HAND  Final   Special Requests   Final    BOTTLES DRAWN AEROBIC AND ANAEROBIC Blood Culture adequate volume   Culture   Final    NO GROWTH < 12 HOURS Performed at [facility], [place] [address] [place], [place] [address]    Report Status PENDING  Incomplete  Respiratory Panel by RT PCR (Flu A&B, Covid) - Nasopharyngeal Swab     Status: None   Collection Time: 07/15/19  7:55 PM   Specimen: Nasopharyngeal Swab  Result Value Ref Range Status   SARS Coronavirus 2 by RT PCR NEGATIVE NEGATIVE Final    Comment: (NOTE) SARS-CoV-2 target nucleic acids are NOT DETECTED. The SARS-CoV-2 RNA is generally detectable in upper respiratoy specimens during the acute phase of infection. The lowest concentration of SARS-CoV-2 viral copies this assay can detect is 131 copies/mL. A negative result  does not preclude SARS-Cov-2 infection and should not be used as the sole basis for treatment or other patient management decisions. A negative result may occur with  improper specimen collection/handling, submission of specimen other than nasopharyngeal swab, presence of viral mutation(s) within the areas targeted by this assay, and inadequate number of viral copies (<131 copies/mL). A negative result must be combined with clinical observations, patient history, and epidemiological information. The expected result is Negative. Fact Sheet for Patients:  PinkCheek.be Fact Sheet for Healthcare Providers:  GravelBags.it This test is not yet ap proved or cleared by the [place] FDA and  has been authorized for detection and/or diagnosis of SARS-CoV-2 by FDA under an Emergency Use Authorization (EUA). This EUA will remain  in effect (meaning this test can be used) for the duration of the COVID-19 declaration under Section 564(b)(1) of the Act, 21 U.S.C. section 360bbb-3(b)(1), unless the authorization  is terminated or revoked sooner.    Influenza A by PCR NEGATIVE NEGATIVE Final   Influenza B by PCR NEGATIVE NEGATIVE Final    Comment: (NOTE) The Xpert Xpress SARS-CoV-2/FLU/RSV assay is intended as an aid in  the diagnosis of influenza from Nasopharyngeal swab specimens and  should not be used as a sole basis for treatment. Nasal washings and  aspirates are unacceptable for Xpert Xpress SARS-CoV-2/FLU/RSV  testing. Fact Sheet for Patients: PinkCheek.be Fact Sheet for Healthcare Providers: GravelBags.it This test is not yet approved or cleared by the [place] FDA and  has been authorized for detection and/or diagnosis of SARS-CoV-2 by  FDA under an Emergency Use Authorization (EUA). This EUA will remain  in effect (meaning this test can be used) for the duration of  the  Covid-19 declaration under Section 564(b)(1) of the Act, 21  U.S.C. section 360bbb-3(b)(1), unless the authorization is  terminated or revoked. Performed at [facility], [place] [address] [place], [place] [address]     Alcide Evener, [place] [facility] [phone] pager  07/16/2019, 3:05 PM

## 2019-07-16 NOTE — H&P (Signed)
History and Physical    [name] JKK:[id number] DOB: 23-Oct-1945 DOA: 07/15/2019  PCP: Ann Held, DO  Patient coming from: Home  I have personally briefly reviewed patient's old medical records in [place]  Chief Complaint: Fever  HPI: [name] is a 74 y.o. female with medical history significant of PAF (eliquis on hold since earlier this month), COPD, 3L home O2 baseline, recurrent PNAs ultimately determined to be due to MAI.  In March pt had bronchoscopy done, BAL would ultimately come back positive for MAI.  Pt started on rifabutin with azithromycin and ethambutol 3 times weekly as of 4/7.  Pt had AICD / PPM replaced on 4/2 (end of battery life).  Pt presents to ED with c/o fever and CP.  Symptoms onset this evening.  No increased SOB, does have some cough. She has had pain like this in the past but did not seek medical treatment at that time because "I didn't want to make a fuss" but states it wasn't recent.  She has been taking her home antibiotics but did miss her doses this morning.  States she hasnt been feeling great for past couple of days, and actually hasnt taken coreg since the 26th.   ED Course: WBC nl, Tm 102.7, CT chest shows basically unchanged MAI, no PE.  BP running low 100s.  Pt non-toxic appearing.  Lactate nl.  BCx pending, hospitalist asked to admit.   Review of Systems: As per HPI, otherwise all review of systems negative.  Past Medical History:  Diagnosis Date  . Anemia   . Arthritis    right hip  . Atrial fibrillation ([place])   . CAD (coronary artery disease)    mild disease per cath in 2011  . CHF (congestive heart failure) ([place])   . COPD (chronic obstructive pulmonary disease) (HCC)    continuous O2- 2l  . Diverticulitis   . Elbow fracture, left aug 2012  . Erythropoietin deficiency anemia 06/19/2018  . Fibromyalgia    COUPLE OF TIMES A YEAR-PT HAS EPISODES OF CONFUSION-USUALLY INVOLVES DAY/NIGHT REVERSAL AND  EPISODES OF TWITCHING AND FALLS--SEES DR. Jacelyn Grip - NEUROLOGIST-LAST SEEN 07/10/11  . Gastroparesis   . GERD (gastroesophageal reflux disease)   . H/O: GI bleed    from Pradaxa  . Hepatitis    hx of in high school   . HTN (hypertension)    under control; has been on med. x "years"  . Hx MRSA infection   . Hx of gastric ulcer   . Hx of stress fx aug 2011   right hip   . Iron malabsorption 08/14/2017  . LBBB (left bundle branch block)   . Oxygen dependent    2 liters via nasal cannula at all times  . Pacemaker    CRT therapy; followed by Dr. Caryl Comes  . Pleural effusion     s/p right thoracentesis 03/09  . Pneumonia - 03/09, 12/09, 02/10, 06/10   MOST RECENT FEB 2013  . Primary dilated cardiomyopathy (HCC)    EF 45 to 50% per echo in Jan 2012  . RLS (restless legs syndrome)   . Silent aspiration   . Small bowel obstruction ([place])   . Urinary incontinence     -INTERSTIM IMPLANT NOT FUNCTIONING PER PT  . Venous embolism and thrombosis of subclavian vein ([place])    after pacemaker insertion Oct 2011  . Vitamin B12 deficiency   . Wears glasses    reading    Past Surgical  History:  Procedure Laterality Date  . APPENDECTOMY    . BIV PACEMAKER GENERATOR CHANGEOUT N/A 06/19/2019   Procedure: BIV PACEMAKER GENERATOR CHANGEOUT;  Surgeon: Deboraha Sprang, MD;  Location: [facility];  Service: Cardiovascular;  Laterality: N/A;  . BRONCHIAL WASHINGS  05/25/2019   Procedure: BRONCHIAL WASHINGS;  Surgeon: Chesley Mires, MD;  Location: [facility];  Service: Endoscopy;;  . CARDIAC CATHETERIZATION  04/08/2006, 11/16/2009  . CHOLECYSTECTOMY N/A 06/27/2017   Procedure: OPEN CHOLECYSTECTOMY;  Surgeon: Ralene Ok, MD;  Location: [facility];  Service: General;  Laterality: N/A;  . COLECTOMY     Intestinal resection (5times)  . COLECTOMY  02/04/2000   ex. lap., intra-abd. subtotal colectomy with ileosigmoid colon anastomosies and lysis of adhesions  . CYSTOSCOPY  11/11/2008  . CYSTOSCOPY W/  RETROGRADES  11/11/2008   right  . CYSTOSCOPY WITH INJECTION  04/30/2006   transurethral collagen injection; incision vaginal stenosis  . ERCP N/A 06/23/2017   Procedure: ENDOSCOPIC RETROGRADE CHOLANGIOPANCREATOGRAPHY (ERCP);  Surgeon: Carol Ada, MD;  Location: [facility];  Service: Endoscopy;  Laterality: N/A;  . ERCP N/A 06/24/2017   Procedure: ENDOSCOPIC RETROGRADE CHOLANGIOPANCREATOGRAPHY (ERCP);  Surgeon: [name], MD;  Location: [facility];  Service: Endoscopy;  Laterality: N/A;  . ESOPHAGOGASTRODUODENOSCOPY N/A 02/02/2013   Procedure: ESOPHAGOGASTRODUODENOSCOPY (EGD);  Surgeon: Lafayette Dragon, MD;  Location: [facility];  Service: Endoscopy;  Laterality: N/A;  . EXPLORATORY LAPAROTOMY  04/27/2009   lysis of adhesions, gastrostomy tube  . GASTROCUTANEOUS FISTULA CLOSURE    . INTERSTIM IMPLANT PLACEMENT  05/28/2006 - stage I   06/05/2006 - stage II  . INTERSTIM IMPLANT PLACEMENT  02/05/2012   Procedure: [name] IMPLANT FIRST STAGE;  Surgeon: Reece Packer, MD;  [facility];  Service: Urology;  Laterality: N/A;  Replacement of Interstim Lead     . INTERSTIM IMPLANT REVISION  03/06/2011   Procedure: REVISION OF [name];  Surgeon: Reece Packer, MD;  [facility];  Service: Urology;  Laterality: N/A;  Replacement of Neurostimulator  . INTERSTIM IMPLANT REVISION  10/23/2007  . IR IMAGING GUIDED PORT INSERTION  10/14/2017  . PACEMAKER PLACEMENT  12/21/2009  . Peg removed     with complications  . PORT-A-CATH REMOVAL  07/27/2011   Procedure: REMOVAL PORT-A-CATH;  Surgeon: Rolm Bookbinder, MD;  Location: [facility];  Service: General;  Laterality: N/A;  . PORTACATH PLACEMENT  12/16/2009  . SAVORY DILATION N/A 02/02/2013   Procedure: SAVORY DILATION;  Surgeon: Lafayette Dragon, MD;  [facility];  Service: Endoscopy;  Laterality: N/A;  . SMALL INTESTINE SURGERY  05/20/2001   ex. lap., resection of small bowel stricture; gastrostomy; insertion  central line  . TONSILLECTOMY  age 47  . TOTAL ABDOMINAL HYSTERECTOMY     complete  . TOTAL HIP ARTHROPLASTY  08/03/2011   Procedure: TOTAL HIP ARTHROPLASTY ANTERIOR APPROACH;  Surgeon: Mcarthur Rossetti, MD;  [facility];  Service: Orthopedics;  Laterality: Right;  [name] VIDEO BRONCHOSCOPY N/A 05/25/2019   Procedure: VIDEO BRONCHOSCOPY WITHOUT FLUORO;  Surgeon: Chesley Mires, MD;  [facility];  Service: Endoscopy;  Laterality: N/A;     reports that she quit smoking about 33 years ago. She has a 20.00 pack-year smoking history. She has never used smokeless tobacco. She reports that she does not drink alcohol or use drugs.  Allergies  Allergen Reactions  . Dabigatran Etexilate Mesylate Other (See Comments)    INTERNAL BLEEDING-pradaxa  . Augmentin [Amoxicillin-Pot Clavulanate] Diarrhea and Nausea Only  Did it involve swelling of the face/tongue/throat, SOB, or low BP? No Did it involve sudden or severe rash/hives, skin peeling, or any reaction on the inside of your mouth or nose? No Did you need to seek medical attention at a hospital or doctor's office? No When did it last happen?02/2019 If all above answers are "NO", may proceed with cephalosporin use.   [name] [Pentazocine] Other (See Comments)    Hallucinations     Family History  Problem Relation Age of Onset  . Heart disease Father   . Stroke Mother   . Heart disease Mother   . Heart disease Brother   . Breast cancer Maternal Aunt   . Heart disease Paternal Aunt   . Diabetes Paternal Grandfather   . Colon cancer Neg Hx      Prior to Admission medications   Medication Sig Start Date End Date Taking? Authorizing Provider  acetaminophen (TYLENOL) 650 MG CR tablet Take 650 mg by mouth every 8 (eight) hours as needed for pain.   Yes [provider]  albuterol (PROVENTIL HFA) 108 (90 Base) MCG/ACT inhaler Inhale 2 puffs into the lungs every 6 (six) hours as needed for wheezing or shortness of  breath.   Yes [provider]  amLODipine (NORVASC) 2.5 MG tablet Take 1 tablet (2.5 mg total) by mouth daily. 09/05/18  Yes Minus Breeding, MD  atorvastatin (LIPITOR) 20 MG tablet TAKE 1 TABLET BY MOUTH ONCE DAILY AT  6PM Patient taking differently: Take 20 mg by mouth every evening.  05/07/19  Yes Roma Schanz R, DO  azithromycin (ZITHROMAX) 500 MG tablet Take 1 tablet (500 mg total) by mouth 3 (three) times a week. Patient taking differently: Take 500 mg by mouth See admin instructions. Take 500 mg by mouth on Tues/Thurs/Sat 06/24/19  Yes Comer, Okey Regal, MD  carvedilol (COREG) 25 MG tablet Take 1 tablet (25 mg total) by mouth 2 (two) times daily. Patient taking differently: Take 25 mg by mouth at bedtime.  03/31/19  Yes Minus Breeding, MD  ethambutol (MYAMBUTOL) 100 MG tablet Take 2 tablets (200 mg total) by mouth 3 (three) times a week. Take with 2 tablets of 400 mg of ethambutol to equal 1000 mg total. Patient taking differently: Take 200 mg by mouth every Monday, Wednesday, and Friday.  06/24/19  Yes Comer, Okey Regal, MD  ethambutol (MYAMBUTOL) 400 MG tablet Take 2 tablets (800 mg total) by mouth 3 (three) times a week. Take with 2 tablets of 100 mg of ethambutol to equal 1000 mg total. Patient taking differently: Take 800 mg by mouth every Monday, Wednesday, and Friday.  06/24/19  Yes Comer, Okey Regal, MD  furosemide (LASIX) 40 MG tablet TAKE 1 TABLET BY MOUTH IN THE MORNING Patient taking differently: Take 40 mg by mouth in the morning.  05/01/19  Yes Minus Breeding, MD  gabapentin (NEURONTIN) 600 MG tablet Take 1 tablet by mouth 4 times daily Patient taking differently: Take 600 mg by mouth 4 (four) times daily.  05/20/19  Yes Roma Schanz R, DO  lidocaine-prilocaine (EMLA) cream Apply 1 application topically as needed. Patient taking differently: Apply 1 application topically as needed (for port access- to numb).  10/16/17  Yes Ennever, Rudell Cobb, MD  nitroGLYCERIN (NITROSTAT)  0.4 MG SL tablet Place 1 tablet (0.4 mg total) under the tongue every 5 (five) minutes as needed. CHEST PAIN Patient taking differently: Place 0.4 mg under the tongue every 5 (five) minutes as needed for chest  pain.  06/01/19  Yes Minus Breeding, MD  oxyCODONE-acetaminophen (PERCOCET) 10-325 MG tablet Take 1 tablet by mouth every 4 (four) hours as needed. PAIN Patient taking differently: Take 1 tablet by mouth every 4 (four) hours as needed for pain.  07/02/19  Yes Roma Schanz R, DO  OXYGEN Inhale 3 L/min into the lungs continuous.    Yes [provider]  pantoprazole (PROTONIX) 40 MG tablet Take 1 tablet by mouth twice daily Patient taking differently: Take 40 mg by mouth 2 (two) times daily before a meal.  04/20/19  Yes Gatha Mayer, MD  promethazine (PHENERGAN) 25 MG tablet Take 25 mg by mouth every 6 (six) hours as needed for nausea or vomiting.   Yes [provider]  rifabutin (MYCOBUTIN) 150 MG capsule Take 2 capsules (300 mg total) by mouth 3 (three) times a week. Patient taking differently: Take 300 mg by mouth every Monday, Wednesday, and Friday.  06/24/19  Yes Comer, Okey Regal, MD  tiZANidine (ZANAFLEX) 4 MG tablet Take 1 tablet (4 mg total) by mouth 3 (three) times daily as needed. for muscle spams Patient taking differently: Take 4 mg by mouth 3 (three) times daily as needed for muscle spasms.  05/07/19  Yes Roma Schanz R, DO  triamcinolone ointment (KENALOG) 0.1 % Apply 1 application topically daily as needed (to affected areas- for psoriasis).  03/19/18  Yes [provider]  umeclidinium bromide (INCRUSE ELLIPTA) 62.5 MCG/INH AEPB Inhale 1 puff into the lungs daily. 09/09/18  Yes Lauraine Rinne, NP  zolpidem (AMBIEN) 5 MG tablet TAKE 1 TABLET BY MOUTH EVERY DAY AT BEDTIME AS NEEDED Patient taking differently: Take 5 mg by mouth at bedtime.  07/13/19  Yes Ann Held, DO  apixaban (ELIQUIS) 5 MG TABS tablet Take 1 tablet (5 mg total) by mouth 2  (two) times daily. 08/04/18   Ann Held, DO  denosumab (PROLIA) 60 MG/ML SOSY injection Inject 60 mg into the skin every 6 (six) months. 03/25/15   [provider]  iron dextran complex (INFED) 50 MG/ML injection Please give infed infusion (no test dose needed) over 4 hours per pharmacy calculated dose. Ht: 5'2 Wt: 99 pounds Hgb: 12 12/20/10 12/16/18  Lafayette Dragon, MD    Physical Exam: Vitals:   07/16/19 0100 07/16/19 0115 07/16/19 0130 07/16/19 0145  BP: (!) 110/59 (!) 110/57 (!) 106/53 109/64  Pulse: 77 75 74 76  Resp: _0 Temp:      TempSrc:      SpO2: 96% 97% 97% 97%  Weight:      Height:        Constitutional: NAD, calm, comfortable, non-toxic appearing Eyes: PERRL, lids and conjunctivae normal ENMT: Mucous membranes are moist. Posterior pharynx clear of any exudate or lesions.Normal dentition.  Neck: normal, supple, no masses, no thyromegaly Respiratory: clear to auscultation bilaterally, no wheezing, no crackles. Normal respiratory effort. No accessory muscle use.  Cardiovascular: Regular rate and rhythm, no murmurs / rubs / gallops. No extremity edema. 2+ pedal pulses. No carotid bruits.  Abdomen: no tenderness, no masses palpated. No hepatosplenomegaly. Bowel sounds positive.  Musculoskeletal: no clubbing / cyanosis. No joint deformity upper and lower extremities. Good ROM, no contractures. Normal muscle tone.  Skin: no rashes, lesions, ulcers. No induration Neurologic: CN 2-12 grossly intact. Sensation intact, DTR normal. Strength 5/5 in all 4.  Psychiatric: Normal judgment and insight. Alert and oriented x 3. Normal mood.  Labs on Admission: I have personally reviewed following labs and imaging studies  CBC: Recent Labs  Lab 07/15/19 1928  WBC 4.7  NEUTROABS 4.0  HGB 11.3*  HCT 37.7  MCV 94.5  PLT 655   Basic Metabolic Panel: Recent Labs  Lab 07/15/19 1928  NA 144  K 4.1  CL 104  CO2 25  GLUCOSE 82  BUN 21  CREATININE  0.98  CALCIUM 7.6*   GFR: Estimated Creatinine Clearance: 32.6 mL/min (by C-G formula based on SCr of 0.98 mg/dL). Liver Function Tests: Recent Labs  Lab 07/15/19 1928  AST 28  ALT 10  ALKPHOS 67  BILITOT 0.6  PROT 6.6  ALBUMIN 3.3*   No results for input(s): LIPASE, AMYLASE in the last 168 hours. No results for input(s): AMMONIA in the last 168 hours. Coagulation Profile: Recent Labs  Lab 07/15/19 1958  INR 1.1   Cardiac Enzymes: No results for input(s): CKTOTAL, CKMB, CKMBINDEX, TROPONINI in the last 168 hours. BNP (last 3 results) No results for input(s): PROBNP in the last 8760 hours. HbA1C: No results for input(s): HGBA1C in the last 72 hours. CBG: No results for input(s): GLUCAP in the last 168 hours. Lipid Profile: No results for input(s): CHOL, HDL, LDLCALC, TRIG, CHOLHDL, LDLDIRECT in the last 72 hours. Thyroid Function Tests: No results for input(s): TSH, T4TOTAL, FREET4, T3FREE, THYROIDAB in the last 72 hours. Anemia Panel: No results for input(s): VITAMINB12, FOLATE, FERRITIN, TIBC, IRON, RETICCTPCT in the last 72 hours. Urine analysis:    Component Value Date/Time   COLORURINE YELLOW 10/08/2017 1330   APPEARANCEUR CLEAR 10/08/2017 1330   LABSPEC 1.011 10/08/2017 1330   PHURINE 6.0 10/08/2017 1330   GLUCOSEU NEGATIVE 10/08/2017 1330   GLUCOSEU NEGATIVE 07/24/2011 0908   HGBUR NEGATIVE 10/08/2017 1330   HGBUR negative 05/19/2010 1609   BILIRUBINUR neg 12/09/2017 1512   KETONESUR NEGATIVE 10/08/2017 1330   PROTEINUR Negative 12/09/2017 1512   PROTEINUR NEGATIVE 10/08/2017 1330   UROBILINOGEN 0.2 12/09/2017 1512   UROBILINOGEN 0.2 05/17/2014 1217   NITRITE neg 12/09/2017 1512   NITRITE NEGATIVE 10/08/2017 1330   LEUKOCYTESUR Negative 12/09/2017 1512    Radiological Exams on Admission: CT Angio Chest PE W and/or Wo Contrast  Result Date: 07/16/2019 CLINICAL DATA:  Chest pain and fever. EXAM: CT ANGIOGRAPHY CHEST WITH CONTRAST TECHNIQUE:  Multidetector CT imaging of the chest was performed using the standard protocol during bolus administration of intravenous contrast. Multiplanar CT image reconstructions and MIPs were obtained to evaluate the vascular anatomy. CONTRAST:  77m OMNIPAQUE IOHEXOL 350 MG/ML SOLN COMPARISON:  April 27, 2019 FINDINGS: Cardiovascular: There is a dual lead AICD. There is mild to moderate severity calcification of the thoracic aorta. Satisfactory opacification of the pulmonary arteries to the segmental level. No evidence of pulmonary embolism. Normal heart size. No pericardial effusion. Mediastinum/Nodes: There is mild right hilar lymphadenopathy. Lungs/Pleura: Stable moderate to marked severity areas of diffuse bilateral peribronchovascular nodularity and nodular consolidation are seen. There is no evidence of a pleural effusion or pneumothorax. Upper Abdomen: No acute abnormality. Musculoskeletal: Degenerative changes seen within the thoracic spine. Review of the MIP images confirms the above findings. IMPRESSION: 1. No evidence of acute pulmonary embolism. 2. Stable moderate to marked severity areas of diffuse bilateral peribronchovascular nodularity and nodular consolidation. 3. Aortic atherosclerosis. Aortic Atherosclerosis (ICD10-I70.0). Electronically Signed   By: TVirgina NorfolkM.D.   On: 07/16/2019 00:37   DG Chest Port 1 View  Result Date: 07/15/2019 CLINICAL DATA:  Cough, shortness of  breath and fever. EXAM: PORTABLE CHEST 1 VIEW COMPARISON:  Chest radiograph 10/02/2017. FINDINGS: Right anterior chest wall Port-A-Cath is present with tip projecting over the superior vena cava. Multi lead pacer apparatus overlies the left hemithorax, leads stable in position. Monitoring leads overlie the patient. Stable cardiac and mediastinal contours. Aortic atherosclerosis. Similar-appearing nodular opacities predominately involving the central aspect of the lungs bilaterally. No pleural effusion or pneumothorax.  IMPRESSION: Scattered nodular opacities favored to correspond with history of MAI. Electronically Signed   By: Lovey Newcomer M.D.   On: 07/15/2019 19:59    EKG: Independently reviewed.  Assessment/Plan Principal Problem:   MAI (mycobacterium avium-intracellulare) infection (HCC) Active Problems:   Essential hypertension   Atrial fibrillation/flutter   Chronic hypoxemic respiratory failure (HCC)   Chronic diastolic CHF (congestive heart failure) (HCC)   Fever    1. Fever - 1. Suspect ongoing MAI as cause of todays fever 2. DDx includes bacteremia related to recent PPM placement 1. Though PPM site looks fine 2. And PPM looked fine on CT scan 3. BCx pending 4. Will hold of on additional ABx other than MAI treatment for the moment. 5. Repeat CBC/BMP in AM 2. MAI - 1. Continue Rifabutin, azithromycin, and ethambutol on MWF 2. MAI doesn't look to have improved at all on CT despite a month of treatment now. 3. Looks like next med planned for this is Inhaled Amikacin (per Dr. Linus Salmons office note on 4/7). 4. PharmD is looking into getting this and will touch base with ID in AM 5. Consult ID in AM (also looks like she has ID office visit on 5/5 scheduled). 3. Chronic hypoxic resp failure - cont O2 via [place] 4. Chronic diastolic CHF - 1. Holding lasix for the moment 5. HTN - 1. BPs running on low side today in ED 2. Will hold norvasc and coreg 6. H/o A.Fib - 1. NSR currently 2. Tele monitor 3. Coreg on hold due to soft BPs (pt hasnt taken in several days anyhow) 4. Eliquis on hold since earlier this month per patient (touch base with cards when she should resume this med, looks like she has cardiology office appt on 5/3).  DVT prophylaxis: SCDs Code Status: Full Family Communication: No family in room Disposition Plan: Pending ID recs Consults called: None, call ID in AM for consult Admission status: Place in obs    Lashundra Shiveley, [place] [facility]  How to contact the [facility]  Attending or Consulting provider [place] or covering provider during after hours [place], for this patient?  1. Check the care team in [facility] and look for a) attending/consulting [facility] provider listed and b) the [facility] team listed 2. Log into [email or web address]  Amion Physician Scheduling and messaging for groups and whole hospitals  On call and physician scheduling software for group practices, residents, hospitalists and other medical providers for call, clinic, rotation and shift schedules. On[employer] is a hospital-wide system for scheduling doctors and paging doctors on call. EasyPlot is for scientific plotting and data analysis.  [email or web address]  and use [place]'s universal password to access. If you do not have the password, please contact the hospital operator.  3. Locate the [facility] provider you are looking for under [facility] and page to a number that you can be directly reached. 4. If you still have difficulty reaching the provider, please page the [facility] (Director on Call) for the Hospitalists listed on amion for assistance.  07/16/2019, 1:55 AM

## 2019-07-16 NOTE — Progress Notes (Signed)
Called [phone] to give report.  Receiving RN unable to take report at this time.

## 2019-07-16 NOTE — ED Provider Notes (Signed)
Patient stable at this time.  She is no longer febrile.  CT chest is negative.  Plan for admission to await blood cultures.  Patient does not appear septic  will defer new antibiotics to inpatient and consulting team.  Discussed with Dr. Alcario Drought for admission   Ripley Fraise, MD 07/16/19 [phone]

## 2019-07-16 NOTE — ED Notes (Signed)
Lunch Tray Ordered @ 1119. 

## 2019-07-16 NOTE — Progress Notes (Signed)
    [facility] has been requested to perform a transesophageal echocardiogram on [name] for bacteremia.  After careful review of history and examination, the risks and benefits of transesophageal echocardiogram have been explained including risks of esophageal damage, perforation (1:10,000 risk), bleeding, pharyngeal hematoma as well as other potential complications associated with conscious sedation including aspiration, arrhythmia, respiratory failure and death. Alternatives to treatment were discussed, questions were answered. Patient is willing to proceed.   Kathyrn Drown, NP  07/16/2019 4:17 PM

## 2019-07-16 NOTE — Progress Notes (Signed)
Patient seen today, admitted overnight by Dr. Alcario Drought, H&P reviewed and agree with the assessment and plan.  In brief she has a history of PAF, COPD on 3 L at home, recurrent pneumonias ultimately determined to be to MAI.  She saw ID as an outpatient and was started on antibiotics a month ago, however she had persistent symptoms and recurrent fevers at home and came to the emergency room.  She also has an AICD/PPM replaced on 4/2 due to end of battery.   Fever-she was febrile in the ED, infectious disease consulted and awaiting their input.  For now continue MAI treatment but hold off further antibiotics.  Cultures were obtained.  MAI-continue rifabutin, azithromycin, ethambutol.  CT scan of the chest looks stable without significant improvement  Chronic hypoxic respiratory failure with underlying COPD-continue home oxygen, this is baseline  Chronic diastolic CHF -Lasix is on hold due to soft blood pressure on admission  Essential hypertension -Hold Norvasc due to soft blood pressures on admission, blood pressure improved and resume Coreg  History of A. fib, paroxysmal -She is normal sinus, resume Coreg  Nonischemic cardiomyopathy -interval resolution  Atrial arrhythmiaLittle by device interrogation  Pacemaker-CRT-[employer] -in place, just changed on 4/2.  Postop course complicated by concern for small hematoma and Eliquis has been on hold for a few days  Roberth Berling M. Cruzita Lederer, MD, PhD [facility]  Between 7 am - 7 pm I am available, please contact me via Amion or Securechat Between 7 pm - 7 am I am not available, please contact night coverage MD/APP via Amion

## 2019-07-16 NOTE — ED Notes (Signed)
Breakfast ordered 

## 2019-07-16 NOTE — Progress Notes (Signed)
EP team asked to assess PPM site with bulky dressing in place and evaluation of hematoma Pt had CRT-P generator change on 06/19/2019 with Dr. Caryl Comes She has been seen in the devic clinic, 4/13, 4/20, and 4/26 noting swelling/hematom and placement/replacements of her pressure dressing  Dressing was nearly off, only remained attached at her back, and I removed this No drainage Later stage ecchymosis noted darker green/blue over the device and areas around There is a scab, no open areas of incision Area is warm, though so is the other side of her chest. (no longer febrile by vitals though on arrival was 102)  She has been off her Eliquis since 06/30/2019 The patient feels like the site is unchanged as far as swelling, no bigger or smaller I replaced with pressure dressing using medipor given she has had tensoplast on.off now for a couple weeks and doubt she will have any enlargement given off her a/c,  will reevaluate tomorrow Discuss with Dr. Lovena Le pending TEE findings/Blood cultures noting ID worries about site infection.  Tommye Standard, PA-C

## 2019-07-16 NOTE — Patient Instructions (Signed)
Visit Information  Goals Addressed            This Visit's Progress   . Complete Bone Density Scan (DEXA)   On track    -DEXA ordered by Dr. Etter Sjogren on 05/04/19 -Last reported DEXA from 03/17/2015 showed osteoporosis with recommendation to repeat in 2 years    . Pharmacy Care Plan       Current Barriers:  . Chronic Disease Management support, education, and care coordination needs related to Afib, COPD, HF, HTN, HLD, CAD, Chronic Pain Syndrome, GERD, Insomnia  Pharmacist Clinical Goal(s):  [name] Hypertension o Over the next 90 days patient's blood pressure will be less than 140/90 while not having hypotension or blood pressure less than 90/60 . Osteoporosis o Over the next 180 days patient will reduce risk of osteoporotic fracture by re-initiating Prolia . Health Maintenance  o Over the next 180 days patient will complete Shingrix vaccine series o Over the next 180 days patient will complete COVID vaccine series if agreeable  Interventions: . Comprehensive medication review performed. . Check blood pressure 2-3 times per week . Get DEXA Scan ordered (completed by Dr. Etter Sjogren)  Patient Self Care Activities:  . Patient verbalizes understanding of plan to follow as described above, Self administers medications as prescribed, Calls pharmacy for medication refills, and Calls provider office for new concerns or questions . Hypertension o Over the next 90 days patient will check blood pressure 2-3 times per week and record readings . Osteoporosis o Over the next 180 days patient will get DEXA Scan o Over the next 180 days patient will patient will restart Prolia injections for osteoporosis . Health Maintenance  o Over the next 180 days patient will complete Shingrix vaccine series o Over the next 180 days patient will complete COVID vaccine series if agreeable  Please see past updates related to this goal by clicking on the "Past Updates" button in the selected goal         The patient  verbalized understanding of instructions provided today and agreed to receive a mailed copy of patient instruction and/or educational materials.  Telephone follow up appointment with pharmacy team member scheduled for: 07/30/2019  [name], PharmD Clinical Pharmacist [facility] [phone]

## 2019-07-16 NOTE — Plan of Care (Signed)
  Problem: Education: Goal: Knowledge of General Education information will improve Description: Including pain rating scale, medication(s)/side effects and non-pharmacologic comfort measures Outcome: Progressing   Problem: Activity: Goal: Ability to tolerate increased activity will improve Outcome: Progressing   

## 2019-07-17 ENCOUNTER — Other Ambulatory Visit: Payer: Self-pay

## 2019-07-17 ENCOUNTER — Inpatient Hospital Stay: Payer: HMO

## 2019-07-17 ENCOUNTER — Encounter (HOSPITAL_COMMUNITY): Payer: Self-pay | Admitting: Internal Medicine

## 2019-07-17 ENCOUNTER — Inpatient Hospital Stay (HOSPITAL_COMMUNITY): Payer: HMO | Admitting: Certified Registered Nurse Anesthetist

## 2019-07-17 ENCOUNTER — Encounter (HOSPITAL_COMMUNITY)
Admission: EM | Disposition: A | Discharge status: Home / Self Care | Payer: Self-pay | Source: Home / Self Care | Attending: Internal Medicine

## 2019-07-17 ENCOUNTER — Inpatient Hospital Stay (HOSPITAL_COMMUNITY): Payer: HMO

## 2019-07-17 DIAGNOSIS — J9611 Chronic respiratory failure with hypoxia: Secondary | ICD-10-CM

## 2019-07-17 DIAGNOSIS — I340 Nonrheumatic mitral (valve) insufficiency: Secondary | ICD-10-CM

## 2019-07-17 DIAGNOSIS — I10 Essential (primary) hypertension: Secondary | ICD-10-CM

## 2019-07-17 DIAGNOSIS — I5032 Chronic diastolic (congestive) heart failure: Secondary | ICD-10-CM

## 2019-07-17 DIAGNOSIS — R7881 Bacteremia: Secondary | ICD-10-CM

## 2019-07-17 HISTORY — PX: TEE WITHOUT CARDIOVERSION: SHX5443

## 2019-07-17 LAB — BASIC METABOLIC PANEL
Anion gap: 7 (ref 5–15)
BUN: 23 mg/dL (ref 8–23)
CO2: 28 mmol/L (ref 22–32)
Calcium: 7.6 mg/dL — ABNORMAL LOW (ref 8.9–10.3)
Chloride: 102 mmol/L (ref 98–111)
Creatinine, Ser: 1.01 mg/dL — ABNORMAL HIGH (ref 0.44–1.00)
GFR calc Af Amer: >60 mL/min (ref >60)
GFR calc non Af Amer: 55 mL/min — ABNORMAL LOW (ref >60)
Glucose, Bld: 88 mg/dL (ref 70–99)
Potassium: 4.6 mmol/L (ref 3.5–5.1)
Sodium: 137 mmol/L (ref 135–145)

## 2019-07-17 LAB — CBC
HCT: 30.9 % — ABNORMAL LOW (ref 36.0–46.0)
Hemoglobin: 9.3 g/dL — ABNORMAL LOW (ref 12.0–15.0)
MCH: 28.2 pg (ref 26.0–34.0)
MCHC: 30.1 g/dL (ref 30.0–36.0)
MCV: 93.6 fL (ref 80.0–100.0)
Platelets: 156 10*3/uL (ref 150–400)
RBC: 3.3 MIL/uL — ABNORMAL LOW (ref 3.87–5.11)
RDW: 15.5 % (ref 11.5–15.5)
WBC: 3.6 10*3/uL — ABNORMAL LOW (ref 4.0–10.5)
nRBC: 0 % (ref 0.0–0.2)

## 2019-07-17 SURGERY — ECHOCARDIOGRAM, TRANSESOPHAGEAL
Anesthesia: Monitor Anesthesia Care

## 2019-07-17 MED ORDER — SODIUM CHLORIDE 0.9 % IV SOLN: INTRAVENOUS | Status: DC | PRN

## 2019-07-17 MED ORDER — SODIUM CHLORIDE 0.9 % IV SOLN: INTRAVENOUS | Status: DC

## 2019-07-17 MED ORDER — PROPOFOL 10 MG/ML IV BOLUS
INTRAVENOUS | Status: DC | PRN
  Administered 2019-07-17 (×2): 10 mg via INTRAVENOUS

## 2019-07-17 MED ORDER — AZITHROMYCIN 500 MG PO TABS
500.0000 mg | ORAL_TABLET | ORAL | Status: DC
  Administered 2019-07-17: 500 mg via ORAL
  Filled 2019-07-17 (×2): qty 1

## 2019-07-17 MED ORDER — PHENYLEPHRINE 40 MCG/ML (10ML) SYRINGE FOR IV PUSH (FOR BLOOD PRESSURE SUPPORT)
PREFILLED_SYRINGE | INTRAVENOUS | Status: DC | PRN
  Administered 2019-07-17: 80 ug via INTRAVENOUS

## 2019-07-17 MED ORDER — RIFABUTIN 150 MG PO CAPS
300.0000 mg | ORAL_CAPSULE | ORAL | Status: DC
  Administered 2019-07-18: 300 mg via ORAL
  Filled 2019-07-17: qty 2

## 2019-07-17 MED ORDER — GABAPENTIN 600 MG PO TABS
600.0000 mg | ORAL_TABLET | Freq: Three times a day (TID) | ORAL | Status: DC
  Administered 2019-07-17 – 2019-07-18 (×2): 600 mg via ORAL
  Filled 2019-07-17 (×2): qty 1

## 2019-07-17 MED ORDER — PROPOFOL 500 MG/50ML IV EMUL
INTRAVENOUS | Status: DC | PRN
  Administered 2019-07-17: 75 ug/kg/min via INTRAVENOUS

## 2019-07-17 NOTE — Progress Notes (Signed)
Chaplain engaged in initial visit with [name].  Chaplain explained the Advanced Directive.  Through talking further, [name] stated she wants to have her daughter added to [employer].  Chaplain told [name]'s nurse about [name]'s request.    Chaplain asked how [name] is doing concerning a procedure she is having today.  [name] expressed feeling ok but wanting to know what is happening with her body.  Chaplain offered prayer over [name].  Chaplain will follow-up as needed.

## 2019-07-17 NOTE — Anesthesia Preprocedure Evaluation (Addendum)
Anesthesia Evaluation  Patient identified by MRN, date of birth, ID band Patient awake    Reviewed: Allergy & Precautions, NPO status , Patient's Chart, lab work & pertinent test results, reviewed documented beta blocker date and time   History of Anesthesia Complications Negative for: history of anesthetic complications  Airway Mallampati: II  TM Distance: >3 FB Neck ROM: Full    Dental  (+) Dental Advisory Given   Pulmonary COPD,  oxygen dependent, former smoker,    Pulmonary exam normal        Cardiovascular hypertension, Pt. on medications and Pt. on home beta blockers + CAD and +CHF  Normal cardiovascular exam+ dysrhythmias Atrial Fibrillation + pacemaker      Neuro/Psych  Headaches, TIA Neuromuscular disease (RLS) negative psych ROS   GI/Hepatic Neg liver ROS, GERD  Medicated and Controlled,  Endo/Other  negative endocrine ROS  Renal/GU negative Renal ROS  Female GU complaint     Musculoskeletal  (+) Arthritis , Fibromyalgia -  Abdominal   Peds  Hematology  (+) anemia ,  On eliquis    Anesthesia Other Findings Covid neg 4/28   Reproductive/Obstetrics                            Anesthesia Physical Anesthesia Plan  ASA: III  Anesthesia Plan: MAC   Post-op Pain Management:    Induction: Intravenous  PONV Risk Score and Plan: 2 and Propofol infusion and Treatment may vary due to age or medical condition  Airway Management Planned: Nasal Cannula and Natural Airway  Additional Equipment: None  Intra-op Plan:   Post-operative Plan:   Informed Consent: I have reviewed the patients History and Physical, chart, labs and discussed the procedure including the risks, benefits and alternatives for the proposed anesthesia with the patient or authorized representative who has indicated his/her understanding and acceptance.       Plan Discussed with: CRNA and  Anesthesiologist  Anesthesia Plan Comments:        Anesthesia Quick Evaluation

## 2019-07-17 NOTE — Anesthesia Procedure Notes (Signed)
Procedure Name: MAC Date/Time: 07/17/2019 1:55 PM Performed by: Harden Mo, CRNA Pre-anesthesia Checklist: Patient identified, Emergency Drugs available, Suction available and Patient being monitored Patient Re-evaluated:Patient Re-evaluated prior to induction Oxygen Delivery Method: Nasal cannula Preoxygenation: Pre-oxygenation with 100% oxygen Induction Type: IV induction Placement Confirmation: positive ETCO2 and breath sounds checked- equal and bilateral Dental Injury: Teeth and Oropharynx as per pre-operative assessment

## 2019-07-17 NOTE — H&P (View-Only) (Signed)
Cardiology Consultation:   Patient ID: MIRIANA GAERTNER MRN: [id number]; DOB: Jan 21, 1946  Admit date: 07/15/2019 Date of Consult: 07/17/2019  Primary Care Provider: Carollee Herter, Alferd Apa, DO Primary Cardiologist: Minus Breeding, MD (previously Dr. Doreatha Lew) Primary Electrophysiologist:  Dr. Caryl Comes   Patient Profile:   JOMARIE GELLIS is a 74 y.o. female with a hx of PAFib and TIAs, non-obstructive CAD by cath 2011, COPD > recurrent pneumonias >> MAI (follows with Dr. Linus Salmons and Dr. Halford Chessman) on home O2, longstanding h/o anemia (iron def, erythropoietin def) gets regular iron infusions (port R chest, follows with [facility]), chronic CHF, NICM, LBBB > CRT-P with normalization of her EF   who is being seen today for the evaluation of possible pacer pocket infection at the request of Dr. Tommy Medal.   Device information:  MDT CRT-P, implanted 12/21/09 >> gen change 06/19/2019 Dr. Caryl Comes Hx of diaphragmatic stmulation (notes mentions a variety of programming changes to eliminate) Initial implant [id number] was complicated by DVT of LUE  Current generator: Percepta CRT-P A: [id number] implanted 12/21/2009 V: [id number] implanted 12/21/2009 LV: [id number] implanted 12/21/2009   AF Hx GIB on Pradaxa and stopped TIAs 2020 > Eliquis Amiodarone d/c appears secondary to concern given her lung issues years ago  History of Present Illness:   Ms. Cressler was admitted to [facility] yesterday with fever and CP by notes.  She reports 103 by home visiting nurse and urged to go to the hospital.  In the ER temp was 102.7 and admitted for further evaluation and management.  Her home coreg held with some lower BPs BC drawn  The patient tells me she has some ongoing aching discomfort at the site of her PPM (bulky pressure dressing in place), though no exertional or positional Chest discomfort.  She has baseline mild SOB and cough unchanged. She has not generally felt well for a couple days, tired, no energy.  EP was asked to  evaluate her site with ongoing evaluation.management outpatient since her gen change of a hematoma.   ID was consulted to the case and mentioned concerns of pocket infection, an abscess rather then hematoma, recommended EP consult for consideration of I&D of pocket to send for culture She is pending TEE today BC so far negative (outpatient has been on azithromycin, ethambutol, and rifabutin for her MAI)  She has been afebrile here since the ER (she has been getting percocet)  LABS K+ 4.6 BUN/Creat 23/1.01 HS Trop 21, 19 Lactic Acid 1.2 > 0.9 WBC 4.7 > 4.0 > 3.6 H/H 9/30 Plts 156  COVID/flu negative  BC (x2) negative so far (07/15/2019)    Past Medical History:  Diagnosis Date   Anemia    Arthritis    right hip   Atrial fibrillation ([place])    CAD (coronary artery disease)    mild disease per cath in 2011   CHF (congestive heart failure) (HCC)    COPD (chronic obstructive pulmonary disease) ([place])    continuous O2- 2l   Diverticulitis    Elbow fracture, left aug 2012   Erythropoietin deficiency anemia 06/19/2018   Fibromyalgia    COUPLE OF TIMES A YEAR-PT HAS EPISODES OF CONFUSION-USUALLY INVOLVES DAY/NIGHT REVERSAL AND EPISODES OF TWITCHING AND FALLS--SEES DR. Jacelyn Grip - NEUROLOGIST-LAST SEEN 07/10/11   Gastroparesis    GERD (gastroesophageal reflux disease)    H/O: GI bleed    from Pradaxa   Hepatitis    hx of in high school    HTN (hypertension)  under control; has been on med. x "years"   Hx MRSA infection    Hx of gastric ulcer    Hx of stress fx aug 2011   right hip    Iron malabsorption 08/14/2017   LBBB (left bundle branch block)    Oxygen dependent    2 liters via nasal cannula at all times   Pacemaker    CRT therapy; followed by Dr. Caryl Comes   Pleural effusion     s/p right thoracentesis 03/09   Pneumonia - 03/09, 12/09, 02/10, 06/10   MOST RECENT FEB 2013   Primary dilated cardiomyopathy ([place])    EF 45 to 50% per echo in Jan 2012   RLS (restless legs  syndrome)    Silent aspiration    Small bowel obstruction (HCC)    Urinary incontinence     -INTERSTIM IMPLANT NOT FUNCTIONING PER PT   Venous embolism and thrombosis of subclavian vein ([place])    after pacemaker insertion Oct 2011   Vitamin B12 deficiency    Wears glasses    reading    Past Surgical History:  Procedure Laterality Date   APPENDECTOMY     BIV PACEMAKER GENERATOR CHANGEOUT N/A 06/19/2019   Procedure: BIV PACEMAKER GENERATOR CHANGEOUT;  Surgeon: Deboraha Sprang, MD;  Location: [place] [facility];  Service: Cardiovascular;  Laterality: N/A;   BRONCHIAL WASHINGS  05/25/2019   Procedure: BRONCHIAL WASHINGS;  Surgeon: Chesley Mires, MD;  Location: [facility];  Service: Endoscopy;;   CARDIAC CATHETERIZATION  04/08/2006, 11/16/2009   CHOLECYSTECTOMY N/A 06/27/2017   Procedure: OPEN CHOLECYSTECTOMY;  Surgeon: Ralene Ok, MD;  Location: [place];  Service: General;  Laterality: N/A;   COLECTOMY     Intestinal resection (5times)   COLECTOMY  02/04/2000   ex. lap., intra-abd. subtotal colectomy with ileosigmoid colon anastomosies and lysis of adhesions   CYSTOSCOPY  11/11/2008   CYSTOSCOPY W/ RETROGRADES  11/11/2008   right   CYSTOSCOPY WITH INJECTION  04/30/2006   transurethral collagen injection; incision vaginal stenosis   ERCP N/A 06/23/2017   Procedure: ENDOSCOPIC RETROGRADE CHOLANGIOPANCREATOGRAPHY (ERCP);  Surgeon: Carol Ada, MD;  Location: [place];  Service: Endoscopy;  Laterality: N/A;   ERCP N/A 06/24/2017   Procedure: ENDOSCOPIC RETROGRADE CHOLANGIOPANCREATOGRAPHY (ERCP);  Surgeon: [name], MD;  Location: [facility];  Service: Endoscopy;  Laterality: N/A;   ESOPHAGOGASTRODUODENOSCOPY N/A 02/02/2013   Procedure: ESOPHAGOGASTRODUODENOSCOPY (EGD);  Surgeon: Lafayette Dragon, MD;  Location: [facility];  Service: Endoscopy;  Laterality: N/A;   EXPLORATORY LAPAROTOMY  04/27/2009   lysis of adhesions, gastrostomy tube   GASTROCUTANEOUS FISTULA CLOSURE     INTERSTIM  IMPLANT PLACEMENT  05/28/2006 - stage I   06/05/2006 - stage II   INTERSTIM IMPLANT PLACEMENT  02/05/2012   Procedure: [name] IMPLANT FIRST STAGE;  Surgeon: Reece Packer, MD;  Location[facility];  Service: Urology;  Laterality: N/A;  Replacement of Interstim Lead      INTERSTIM IMPLANT REVISION  03/06/2011   Procedure: REVISION OF [name];  Surgeon: Reece Packer, MD;  Location: [facility];  Service: Urology;  Laterality: N/A;  Replacement of Neurostimulator   INTERSTIM IMPLANT REVISION  10/23/2007   IR IMAGING GUIDED PORT INSERTION  10/14/2017   PACEMAKER PLACEMENT  12/21/2009   Peg removed     with complications   PORT-A-CATH REMOVAL  07/27/2011   Procedure: REMOVAL PORT-A-CATH;  Surgeon: Rolm Bookbinder, MD;  Location: [place];  Service: General;  Laterality: N/A;   Narcissa  12/16/2009   SAVORY DILATION N/A 02/02/2013   Procedure: SAVORY DILATION;  Surgeon: Lafayette Dragon, MD;  [facility];  Service: Endoscopy;  Laterality: N/A;   SMALL INTESTINE SURGERY  05/20/2001   ex. lap., resection of small bowel stricture; gastrostomy; insertion central line   TONSILLECTOMY  age 89   TOTAL ABDOMINAL HYSTERECTOMY     complete   TOTAL HIP ARTHROPLASTY  08/03/2011   Procedure: TOTAL HIP ARTHROPLASTY ANTERIOR APPROACH;  Surgeon: Mcarthur Rossetti, MD;  [facility];  Service: Orthopedics;  Laterality: Right;   VIDEO BRONCHOSCOPY N/A 05/25/2019   Procedure: VIDEO BRONCHOSCOPY WITHOUT FLUORO;  Surgeon: Chesley Mires, MD;  [facility];  Service: Endoscopy;  Laterality: N/A;     Home Medications:  Prior to Admission medications   Medication Sig Start Date End Date Taking? Authorizing Provider  acetaminophen (TYLENOL) 650 MG CR tablet Take 650 mg by mouth every 8 (eight) hours as needed for pain.   Yes [provider]  albuterol (PROVENTIL HFA) 108 (90 Base) MCG/ACT inhaler Inhale 2 puffs into the lungs every 6 (six) hours as needed  for wheezing or shortness of breath.   Yes [provider]  amLODipine (NORVASC) 2.5 MG tablet Take 1 tablet (2.5 mg total) by mouth daily. 09/05/18  Yes Minus Breeding, MD  atorvastatin (LIPITOR) 20 MG tablet TAKE 1 TABLET BY MOUTH ONCE DAILY AT  6PM Patient taking differently: Take 20 mg by mouth every evening.  05/07/19  Yes Roma Schanz R, DO  azithromycin (ZITHROMAX) 500 MG tablet Take 1 tablet (500 mg total) by mouth 3 (three) times a week. Patient taking differently: Take 500 mg by mouth See admin instructions. Take 500 mg by mouth on Tues/Thurs/Sat 06/24/19  Yes Comer, Okey Regal, MD  carvedilol (COREG) 25 MG tablet Take 1 tablet (25 mg total) by mouth 2 (two) times daily. Patient taking differently: Take 25 mg by mouth at bedtime.  03/31/19  Yes Minus Breeding, MD  ethambutol (MYAMBUTOL) 100 MG tablet Take 2 tablets (200 mg total) by mouth 3 (three) times a week. Take with 2 tablets of 400 mg of ethambutol to equal 1000 mg total. Patient taking differently: Take 200 mg by mouth every Monday, Wednesday, and Friday.  06/24/19  Yes Comer, Okey Regal, MD  ethambutol (MYAMBUTOL) 400 MG tablet Take 2 tablets (800 mg total) by mouth 3 (three) times a week. Take with 2 tablets of 100 mg of ethambutol to equal 1000 mg total. Patient taking differently: Take 800 mg by mouth every Monday, Wednesday, and Friday.  06/24/19  Yes Comer, Okey Regal, MD  furosemide (LASIX) 40 MG tablet TAKE 1 TABLET BY MOUTH IN THE MORNING Patient taking differently: Take 40 mg by mouth in the morning.  05/01/19  Yes Minus Breeding, MD  gabapentin (NEURONTIN) 600 MG tablet Take 1 tablet by mouth 4 times daily Patient taking differently: Take 600 mg by mouth 4 (four) times daily.  05/20/19  Yes Roma Schanz R, DO  lidocaine-prilocaine (EMLA) cream Apply 1 application topically as needed. Patient taking differently: Apply 1 application topically as needed (for port access- to numb).  10/16/17  Yes Ennever, Rudell Cobb, MD    nitroGLYCERIN (NITROSTAT) 0.4 MG SL tablet Place 1 tablet (0.4 mg total) under the tongue every 5 (five) minutes as needed. CHEST PAIN Patient taking differently: Place 0.4 mg under the tongue every 5 (five) minutes as needed for chest pain.  06/01/19  Yes Minus Breeding, MD  oxyCODONE-acetaminophen (  PERCOCET) 10-325 MG tablet Take 1 tablet by mouth every 4 (four) hours as needed. PAIN Patient taking differently: Take 1 tablet by mouth every 4 (four) hours as needed for pain.  07/02/19  Yes Roma Schanz R, DO  OXYGEN Inhale 3 L/min into the lungs continuous.    Yes [provider]  pantoprazole (PROTONIX) 40 MG tablet Take 1 tablet by mouth twice daily Patient taking differently: Take 40 mg by mouth 2 (two) times daily before a meal.  04/20/19  Yes Gatha Mayer, MD  promethazine (PHENERGAN) 25 MG tablet Take 25 mg by mouth every 6 (six) hours as needed for nausea or vomiting.   Yes [provider]  rifabutin (MYCOBUTIN) 150 MG capsule Take 2 capsules (300 mg total) by mouth 3 (three) times a week. Patient taking differently: Take 300 mg by mouth every Monday, Wednesday, and Friday.  06/24/19  Yes Comer, Okey Regal, MD  tiZANidine (ZANAFLEX) 4 MG tablet Take 1 tablet (4 mg total) by mouth 3 (three) times daily as needed. for muscle spams Patient taking differently: Take 4 mg by mouth 3 (three) times daily as needed for muscle spasms.  05/07/19  Yes Roma Schanz R, DO  triamcinolone ointment (KENALOG) 0.1 % Apply 1 application topically daily as needed (to affected areas- for psoriasis).  03/19/18  Yes [provider]  umeclidinium bromide (INCRUSE ELLIPTA) 62.5 MCG/INH AEPB Inhale 1 puff into the lungs daily. 09/09/18  Yes Lauraine Rinne, NP  zolpidem (AMBIEN) 5 MG tablet TAKE 1 TABLET BY MOUTH EVERY DAY AT BEDTIME AS NEEDED Patient taking differently: Take 5 mg by mouth at bedtime.  07/13/19  Yes Ann Held, DO  apixaban (ELIQUIS) 5 MG TABS tablet Take 1  tablet (5 mg total) by mouth 2 (two) times daily. 08/04/18   Ann Held, DO  denosumab (PROLIA) 60 MG/ML SOSY injection Inject 60 mg into the skin every 6 (six) months. 03/25/15   [provider]  iron dextran complex (INFED) 50 MG/ML injection Please give infed infusion (no test dose needed) over 4 hours per pharmacy calculated dose. Ht: 5'2 Wt: 99 pounds Hgb: 12 12/20/10 12/16/18  Lafayette Dragon, MD    Inpatient Medications: Scheduled Meds:  atorvastatin  20 mg Oral QPM   azithromycin  500 mg Oral Q M,W,F   carvedilol  12.5 mg Oral BID WC   Chlorhexidine Gluconate Cloth  6 each Topical Daily   ethambutol  1,000 mg Oral Q M,W,F   gabapentin  600 mg Oral TID AC & HS   pantoprazole  40 mg Oral BID AC   [START ON 07/18/2019] rifabutin  300 mg Oral Once per day on Tue Thu Sat   umeclidinium bromide  1 puff Inhalation Daily   Continuous Infusions:   PRN Meds: acetaminophen **OR** acetaminophen, albuterol, fentaNYL (SUBLIMAZE) injection, ondansetron **OR** ondansetron (ZOFRAN) IV, oxyCODONE-acetaminophen **AND** oxyCODONE, sodium chloride flush, tiZANidine, zolpidem  Allergies:    Allergies  Allergen Reactions   Dabigatran Etexilate Mesylate Other (See Comments)    INTERNAL BLEEDING-pradaxa   Augmentin [Amoxicillin-Pot Clavulanate] Diarrhea and Nausea Only    Did it involve swelling of the face/tongue/throat, SOB, or low BP? No Did it involve sudden or severe rash/hives, skin peeling, or any reaction on the inside of your mouth or nose? No Did you need to seek medical attention at a hospital or doctor's office? No When did it last happen? 02/2019 If all above answers are "NO", may proceed  with cephalosporin use.    Talwin [Pentazocine] Other (See Comments)    Hallucinations     Social History:   Social History   Socioeconomic History   Marital status: Married    Spouse name: Not on file   Number of children: 5   Years of education: Not on file   Highest  education level: Not on file  Occupational History   Occupation: disabled    Employer: RETIRED  Tobacco Use   Smoking status: Former Smoker    Packs/day: 1.00    Years: 20.00    Pack years: 20.00    Quit date: 03/19/1986    Years since quitting: 33.3   Smokeless tobacco: Never Used  Substance and Sexual Activity   Alcohol use: No    Alcohol/week: 0.0 standard drinks   Drug use: No   Sexual activity: Yes  Other Topics Concern   Not on file  Social History Narrative   Occupation: Disabled   Married - second time - biologic daughter (in [place]) from first marriage - adopted 4 3 alive with second marriage   Alcohol Use - no   Illicit Drug Use - no   Patient is a former smoker, quit 1988 x41yrs, <1ppd.    Daily Caffeine Use   01/20/2015 - updated   Social Determinants of Health   Financial Resource Strain:    Difficulty of Paying Living Expenses:   Food Insecurity: No Food Insecurity   Worried About Charity fundraiser in the Last Year: Never true   Ran Out of Food in the Last Year: Never true  Transportation Needs: No Transportation Needs   Lack of Transportation (Medical): No   Lack of Transportation (Non-Medical): No  Physical Activity:    Days of Exercise per Week:    Minutes of Exercise per Session:   Stress:    Feeling of Stress :   Social Connections:    Frequency of Communication with Friends and Family:    Frequency of Social Gatherings with Friends and Family:    Attends Religious Services:    Active Member of Clubs or Organizations:    Attends Music therapist:    Marital Status:   Intimate Partner Violence:    Fear of Current or Ex-Partner:    Emotionally Abused:    Physically Abused:    Sexually Abused:     Family History:    Family History  Problem Relation Age of Onset   Heart disease Father    Stroke Mother    Heart disease Mother    Heart disease Brother    Breast cancer Maternal Aunt    Heart disease Paternal Aunt    Diabetes  Paternal Grandfather    Colon cancer Neg Hx      ROS:  Please see the history of present illness.  All other ROS reviewed and negative.     Physical Exam/Data:   Vitals:   07/16/19 1935 07/16/19 2312 07/17/19 0358 07/17/19 0905  BP: 113/63 129/71 140/87 124/71  Pulse: 78 81 80 87  Resp: 16 16 16 18   Temp: 98.3 F (36.8 C) 98.1 F (36.7 C) 98.7 F (37.1 C) 99.5 F (37.5 C)  TempSrc: Oral Oral Oral   SpO2: 95% 92% 100% 98%  Weight: 40.1 kg     Height:        Intake/Output Summary (Last 24 hours) at 07/17/2019 1004 Last data filed at 07/17/2019 0700 Gross per 24 hour  Intake 420 ml  Output 1100  ml  Net -680 ml   Last 3 Weights 07/16/2019 07/16/2019 07/15/2019  Weight (lbs) 88 lb 8 oz 90 lb 90 lb 6.2 oz  Weight (kg) 40.143 kg 40.824 kg 41 kg     Body mass index is 17.28 kg/m.  General:  Well nourished, well developed, in no acute distress, frail, very thin body habitus, looks older then her age 51: normal Lymph: no adenopathy Neck: no JVD Endocrine:  No thryomegaly Vascular: No carotid bruits Cardiac:  RRR; soft SM, no gallops or rubs Port R chest Lungs:  CTA b/l, no wheezing, rhonchi or rales  Abd: soft, nontender  Ext: no edema Musculoskeletal:  No deformities Skin: warm and dry  Neuro:  no focal abnormalities noted Psych:  Normal affect   R chest, PPM site: Dressing from yesterday removed No change in appearance, old ecchymosis discoloration, dark over the generator only, remains swollen, is soft, mildly tender to palpation Scab remains, no open areas or drainage No erythema    EKG:  The EKG was personally reviewed and demonstrates:   SR/V paced ST 103bpm, V paced  Telemetry:  Telemetry was personally reviewed and demonstrates:   SR, V paced  Relevant CV Studies:  06/02/2019: TTE Study Conclusions  - Left ventricle: The cavity size was normal. Systolic function was    normal. The estimated ejection fraction was in the range of 55%    to 60%.  Wall motion was normal; there were no regional wall    motion abnormalities. Doppler parameters are consistent with    abnormal left ventricular relaxation (grade 1 diastolic    dysfunction).  - Mitral valve: Structurally normal valve. Chordal calcification.    There was moderate regurgitation.  - Left atrium: The atrium was mildly dilated.  - Pulmonary arteries: Systolic pressure was mildly increased. PA    peak pressure: 33 mm Hg (S).   Impressions:  - No cardiac source of emboli was indentified.     Laboratory Data:  High Sensitivity Troponin:   Recent Labs  Lab 07/15/19 1928 07/15/19 2256  TROPONINIHS 21* 19*     Chemistry Recent Labs  Lab 07/15/19 1928 07/16/19 1921 07/17/19 0412  NA 144 137 137  K 4.1 4.5 4.6  CL 104 100 102  CO2 25 29 28   GLUCOSE 82 115* 88  BUN 21 24* 23  CREATININE 0.98 1.22* 1.01*  CALCIUM 7.6* 7.5* 7.6*  GFRNONAA 57* 44* 55*  GFRAA >60 51* >60  ANIONGAP 15 8 7     Recent Labs  Lab 07/15/19 1928  PROT 6.6  ALBUMIN 3.3*  AST 28  ALT 10  ALKPHOS 67  BILITOT 0.6   Hematology Recent Labs  Lab 07/15/19 1928 07/16/19 1921 07/17/19 0412  WBC 4.7 4.0 3.6*  RBC 3.99 3.25* 3.30*  HGB 11.3* 9.1* 9.3*  HCT 37.7 30.4* 30.9*  MCV 94.5 93.5 93.6  MCH 28.3 28.0 28.2  MCHC 30.0 29.9* 30.1  RDW 15.7* 15.5 15.5  PLT 163 154 156   BNPNo results for input(s): BNP, PROBNP in the last 168 hours.  DDimer No results for input(s): DDIMER in the last 168 hours.   Radiology/Studies:   CT Angio Chest PE W and/or Wo Contrast Result Date: 07/16/2019 CLINICAL DATA:  Chest pain and fever. EXAM: CT ANGIOGRAPHY CHEST WITH CONTRAST TECHNIQUE: Multidetector CT imaging of the chest was performed using the standard protocol during bolus administration of intravenous contrast. Multiplanar CT image reconstructions and MIPs were obtained to evaluate the vascular anatomy. CONTRAST:  58mL OMNIPAQUE IOHEXOL 350 MG/ML SOLN COMPARISON:  April 27, 2019 FINDINGS:  Cardiovascular: There is a dual lead AICD. There is mild to moderate severity calcification of the thoracic aorta. Satisfactory opacification of the pulmonary arteries to the segmental level. No evidence of pulmonary embolism. Normal heart size. No pericardial effusion. Mediastinum/Nodes: There is mild right hilar lymphadenopathy. Lungs/Pleura: Stable moderate to marked severity areas of diffuse bilateral peribronchovascular nodularity and nodular consolidation are seen. There is no evidence of a pleural effusion or pneumothorax. Upper Abdomen: No acute abnormality. Musculoskeletal: Degenerative changes seen within the thoracic spine. Review of the MIP images confirms the above findings. IMPRESSION: 1. No evidence of acute pulmonary embolism. 2. Stable moderate to marked severity areas of diffuse bilateral peribronchovascular nodularity and nodular consolidation. 3. Aortic atherosclerosis. Aortic Atherosclerosis (ICD10-I70.0). Electronically Signed   By: Virgina Norfolk M.D.   On: 07/16/2019 00:37     DG Chest Port 1 View Result Date: 07/15/2019 CLINICAL DATA:  Cough, shortness of breath and fever. EXAM: PORTABLE CHEST 1 VIEW COMPARISON:  Chest radiograph 10/02/2017. FINDINGS: Right anterior chest wall Port-A-Cath is present with tip projecting over the superior vena cava. Multi lead pacer apparatus overlies the left hemithorax, leads stable in position. Monitoring leads overlie the patient. Stable cardiac and mediastinal contours. Aortic atherosclerosis. Similar-appearing nodular opacities predominately involving the central aspect of the lungs bilaterally. No pleural effusion or pneumothorax. IMPRESSION: Scattered nodular opacities favored to correspond with history of MAI. Electronically Signed   By: Lovey Newcomer M.D.   On: 07/15/2019 19:59     Assessment and Plan:   1. Febrile illness 2. PPM pocket hematoma post gen change 4/2//21      Dr. Lovena Le has seen and examined the patient this morning No  obvious signs of infection at the site, hematoma remains stable in size (by pt's account)  Continue off eliquis, she remains in SR No need for further pressure dressing  Await TEE findings, blood cultures  Will not plan to enter the pocket unless there is indication for device extraction by + cultures or evidence of vegetation by her TEE today   For questions or updates, please contact [facility] Please consult [email or web address] for contact info under   Signed, Baldwin Jamaica, PA-C  07/17/2019 10:04 AM  EP attending  Patient seen and examined.  Agree with the findings as noted above.  The patient presents with a fever and noted to have a swollen pacemaker pocket.  She is a very pleasant 74 year old woman with a history of chronic systolic heart failure and left bundle branch block status post biventricular pacemaker insertion who underwent pacemaker generator change out approximately 4 weeks ago.  She was on systemic anticoagulation.  The patient developed a pocket hematoma and she has had bandage and discontinuation of her anticoagulation.  Unfortunately the patient has an indwelling right Port-A-Cath and she is using this for intravenous iron infusions.  The patient has had subjective fevers and a temperature of 102.  Her blood cultures have remained sterile.  She has continued to have a pocket hematoma although it appears that this has been resolving for several days.  On physical exam she is a frail-appearing woman in no acute distress the exam is accurately noted above.  I should note that her pacemaker pocket is fluctuant but nontender and not particularly warm.  There is no drainage.  There is no erythema.  It appears to have an underlying hematoma present.  Her transesophageal echo is pending at this time.  Labs are unrevealing.  Her blood cultures have been sterile.  Assessment and plan 1.  Pacemaker pocket swelling with concerns of possible indolent infection -the patient's pocket  itself does not particularly look bad to me.  I think she has a resolving hematoma.  I do not think there is an infection in her pacemaker pocket.  The patient has had a fever and agree with plans for work-up including transesophageal echo.  Her blood cultures have remained sterile.  She is not dependent on her pacemaker and if needed extraction would be an option although we would prefer to avoid this if at all possible as per surgical risk is not trivial. 2.  Chronic systolic heart failure -she is status post biventricular pacemaker with normalization of her left ventricular systolic function.  She will continue on guideline directed medical therapy.  Crissie Sickles, MD

## 2019-07-17 NOTE — Progress Notes (Signed)
Subjective: No new complaints   Antibiotics:  Anti-infectives (From admission, onward)   Start     Dose/Rate Route Frequency Ordered Stop   07/18/19 1000  [MAR Hold]  rifabutin (MYCOBUTIN) capsule 300 mg     (MAR Hold since Fri 07/17/2019 at 1304.Hold Reason: Transfer to a Procedural area.)   300 mg Oral Once per day on Tue Thu Sat 07/17/19 0842     07/17/19 1000  ethambutol (MYAMBUTOL) tablet 200 mg  Status:  Discontinued    Note to Pharmacy: Take with 2 tablets of 400 mg of ethambutol to equal 1000 mg total.     200 mg Oral Every M-W-F 07/16/19 0126 07/16/19 0213   07/17/19 1000  ethambutol (MYAMBUTOL) tablet 800 mg  Status:  Discontinued    Note to Pharmacy: Take with 2 tablets of 100 mg of ethambutol to equal 1000 mg total.     800 mg Oral Every M-W-F 07/16/19 0126 07/16/19 0213   07/17/19 1000  rifabutin (MYCOBUTIN) capsule 300 mg  Status:  Discontinued     300 mg Oral Every M-W-F 07/16/19 0126 07/17/19 0842   07/17/19 1000  [MAR Hold]  ethambutol (MYAMBUTOL) tablet 1,000 mg     (MAR Hold since Fri 07/17/2019 at 1304.Hold Reason: Transfer to a Procedural area.)   1,000 mg Oral Every M-W-F 07/16/19 0146     07/17/19 1000  [MAR Hold]  azithromycin (ZITHROMAX) tablet 500 mg     (MAR Hold since Fri 07/17/2019 at 1304.Hold Reason: Transfer to a Procedural area.)  Note to Pharmacy: Patient taking differently: Take 500 mg by mouth on Tues/Thurs/Sat     500 mg Oral Every M-W-F 07/17/19 0842     07/16/19 1000  azithromycin (ZITHROMAX) tablet 500 mg  Status:  Discontinued    Note to Pharmacy: Patient taking differently: Take 500 mg by mouth on Tues/Thurs/Sat     500 mg Oral Once per day on Tue Thu Sat 07/16/19 0126 07/17/19 0842   07/16/19 0800  ethambutol (MYAMBUTOL) tablet 1,000 mg     1,000 mg Oral  Once 07/16/19 0233 07/16/19 0959   07/16/19 0800  rifabutin (MYCOBUTIN) capsule 300 mg     300 mg Oral  Once 07/16/19 0233 07/16/19 0956      Medications: Scheduled Meds:  [MAR  Hold] atorvastatin  20 mg Oral QPM   [MAR Hold] azithromycin  500 mg Oral Q M,W,F   [MAR Hold] carvedilol  12.5 mg Oral BID WC   [MAR Hold] Chlorhexidine Gluconate Cloth  6 each Topical Daily   [MAR Hold] ethambutol  1,000 mg Oral Q M,W,F   [MAR Hold] gabapentin  600 mg Oral TID   [MAR Hold] pantoprazole  40 mg Oral BID AC   [MAR Hold] rifabutin  300 mg Oral Once per day on Tue Thu Sat   [facility] Hold] umeclidinium bromide  1 puff Inhalation Daily   Continuous Infusions:  sodium chloride 20 mL/hr at 07/17/19 1320   PRN Meds:.[MAR Hold] acetaminophen **OR** [MAR Hold] acetaminophen, [MAR Hold] albuterol, [MAR Hold] fentaNYL (SUBLIMAZE) injection, [MAR Hold] ondansetron **OR** [MAR Hold] ondansetron (ZOFRAN) IV, [MAR Hold] oxyCODONE-acetaminophen **AND** [MAR Hold] oxyCODONE, [MAR Hold] sodium chloride flush, [MAR Hold] tiZANidine, [MAR Hold] zolpidem    Objective: Weight change: -0.176 kg  Intake/Output Summary (Last 24 hours) at 07/17/2019 1334 Last data filed at 07/17/2019 0700 Gross per 24 hour  Intake 420 ml  Output 1100 ml  Net -680 ml   Blood pressure [name])  138/59, pulse 75, temperature 99.5 F (37.5 C), resp. rate 18, height 5' (1.524 m), weight 40.1 kg, SpO2 100 %. Temp:  [97.9 F (36.6 C)-99.5 F (37.5 C)] 99.5 F (37.5 C) (04/30 0905) Pulse Rate:  [73-87] 75 (04/30 1318) Resp:  [14-20] 18 (04/30 1318) BP: (93-140)/(47-87) 138/59 (04/30 1318) SpO2:  [92 %-100 %] 100 % (04/30 1318) Weight:  [40.1 kg] 40.1 kg (04/29 1935)  Physical Exam: General: Alert and awake, oriented x3, not in any acute distress. HEENT: anicteric sclera, EOMI CVS regular rate, normal no mgr, generator site with large swollen area Chest: , no wheezing, no respiratory distress no wheezes rhonchi rales Abdomen: soft non-distended,  Neuro: nonfocal  CBC:    BMET Recent Labs    07/16/19 1921 07/17/19 0412  NA 137 137  K 4.5 4.6  CL 100 102  CO2 29 28  GLUCOSE 115* 88  BUN 24* 23    CREATININE 1.22* 1.01*  CALCIUM 7.5* 7.6*     Liver Panel  Recent Labs    07/15/19 1928  PROT 6.6  ALBUMIN 3.3*  AST 28  ALT 10  ALKPHOS 67  BILITOT 0.6       Sedimentation Rate No results for input(s): ESRSEDRATE in the last 72 hours. C-Reactive Protein No results for input(s): CRP in the last 72 hours.  Micro Results: Recent Results (from the past 720 hour(s))  Blood Culture (routine x 2)     Status: None (Preliminary result)   Collection Time: 07/15/19  7:21 PM   Specimen: BLOOD RIGHT FOREARM  Result Value Ref Range Status   Specimen Description BLOOD RIGHT FOREARM  Final   Special Requests   Final    BOTTLES DRAWN AEROBIC ONLY Blood Culture adequate volume   Culture   Final    NO GROWTH < 12 HOURS Performed at [facility], [address] [place], [place] [address]    Report Status PENDING  Incomplete  Blood Culture (routine x 2)     Status: None (Preliminary result)   Collection Time: 07/15/19  7:32 PM   Specimen: BLOOD LEFT HAND  Result Value Ref Range Status   Specimen Description BLOOD LEFT HAND  Final   Special Requests   Final    BOTTLES DRAWN AEROBIC AND ANAEROBIC Blood Culture adequate volume   Culture   Final    NO GROWTH < 12 HOURS Performed at [facility], [place] [address] [place], [place] [address]    Report Status PENDING  Incomplete  Respiratory Panel by RT PCR (Flu A&B, Covid) - Nasopharyngeal Swab     Status: None   Collection Time: 07/15/19  7:55 PM   Specimen: Nasopharyngeal Swab  Result Value Ref Range Status   SARS Coronavirus 2 by RT PCR NEGATIVE NEGATIVE Final    Comment: (NOTE) SARS-CoV-2 target nucleic acids are NOT DETECTED. The SARS-CoV-2 RNA is generally detectable in upper respiratoy specimens during the acute phase of infection. The lowest concentration of SARS-CoV-2 viral copies this assay can detect is 131 copies/mL. A negative result does not preclude SARS-Cov-2 infection and should not be used as the  sole basis for treatment or other patient management decisions. A negative result may occur with  improper specimen collection/handling, submission of specimen other than nasopharyngeal swab, presence of viral mutation(s) within the areas targeted by this assay, and inadequate number of viral copies (<131 copies/mL). A negative result must be combined with clinical observations, patient history, and epidemiological information. The expected result  is Negative. Fact Sheet for Patients:  PinkCheek.be Fact Sheet for Healthcare Providers:  GravelBags.it This test is not yet ap proved or cleared by the [place] FDA and  has been authorized for detection and/or diagnosis of SARS-CoV-2 by FDA under an Emergency Use Authorization (EUA). This EUA will remain  in effect (meaning this test can be used) for the duration of the COVID-19 declaration under Section 564(b)(1) of the Act, 21 U.S.C. section 360bbb-3(b)(1), unless the authorization is terminated or revoked sooner.    Influenza A by PCR NEGATIVE NEGATIVE Final   Influenza B by PCR NEGATIVE NEGATIVE Final    Comment: (NOTE) The Xpert Xpress SARS-CoV-2/FLU/RSV assay is intended as an aid in  the diagnosis of influenza from Nasopharyngeal swab specimens and  should not be used as a sole basis for treatment. Nasal washings and  aspirates are unacceptable for Xpert Xpress SARS-CoV-2/FLU/RSV  testing. Fact Sheet for Patients: PinkCheek.be Fact Sheet for Healthcare Providers: GravelBags.it This test is not yet approved or cleared by the [place] FDA and  has been authorized for detection and/or diagnosis of SARS-CoV-2 by  FDA under an Emergency Use Authorization (EUA). This EUA will remain  in effect (meaning this test can be used) for the duration of the  Covid-19 declaration under Section 564(b)(1) of the Act, 21    U.S.C. section 360bbb-3(b)(1), unless the authorization is  terminated or revoked. Performed at [facility], [place] [address], [place], [place] [address]   Urine culture     Status: Abnormal (Preliminary result)   Collection Time: 07/16/19  3:12 PM   Specimen: In/Out Cath Urine  Result Value Ref Range Status   Specimen Description IN/OUT CATH URINE  Final   Special Requests   Final    NONE Performed at [facility], [place] [address], [place], [place] [address]    Culture >=100,000 COLONIES/mL GRAM NEGATIVE RODS (A)  Final   Report Status PENDING  Incomplete    Studies/Results: CT Angio Chest PE W and/or Wo Contrast  Result Date: 07/16/2019 CLINICAL DATA:  Chest pain and fever. EXAM: CT ANGIOGRAPHY CHEST WITH CONTRAST TECHNIQUE: Multidetector CT imaging of the chest was performed using the standard protocol during bolus administration of intravenous contrast. Multiplanar CT image reconstructions and MIPs were obtained to evaluate the vascular anatomy. CONTRAST:  53mL OMNIPAQUE IOHEXOL 350 MG/ML SOLN COMPARISON:  April 27, 2019 FINDINGS: Cardiovascular: There is a dual lead AICD. There is mild to moderate severity calcification of the thoracic aorta. Satisfactory opacification of the pulmonary arteries to the segmental level. No evidence of pulmonary embolism. Normal heart size. No pericardial effusion. Mediastinum/Nodes: There is mild right hilar lymphadenopathy. Lungs/Pleura: Stable moderate to marked severity areas of diffuse bilateral peribronchovascular nodularity and nodular consolidation are seen. There is no evidence of a pleural effusion or pneumothorax. Upper Abdomen: No acute abnormality. Musculoskeletal: Degenerative changes seen within the thoracic spine. Review of the MIP images confirms the above findings. IMPRESSION: 1. No evidence of acute pulmonary embolism. 2. Stable moderate to marked severity areas of diffuse bilateral peribronchovascular nodularity and nodular  consolidation. 3. Aortic atherosclerosis. Aortic Atherosclerosis (ICD10-I70.0). Electronically Signed   By: Virgina Norfolk M.D.   On: 07/16/2019 00:37   DG Chest Port 1 View  Result Date: 07/15/2019 CLINICAL DATA:  Cough, shortness of breath and fever. EXAM: PORTABLE CHEST 1 VIEW COMPARISON:  Chest radiograph 10/02/2017. FINDINGS: Right anterior chest wall Port-A-Cath is present with tip projecting over the superior vena cava. Multi lead  pacer apparatus overlies the left hemithorax, leads stable in position. Monitoring leads overlie the patient. Stable cardiac and mediastinal contours. Aortic atherosclerosis. Similar-appearing nodular opacities predominately involving the central aspect of the lungs bilaterally. No pleural effusion or pneumothorax. IMPRESSION: Scattered nodular opacities favored to correspond with history of MAI. Electronically Signed   By: Lovey Newcomer M.D.   On: 07/15/2019 19:59      Assessment/Plan:  INTERVAL HISTORY:   Pt having TEE today   Principal Problem:   MAI (mycobacterium avium-intracellulare) infection ([place]) Active Problems:   Essential hypertension   Atrial fibrillation/flutter   Chronic hypoxemic respiratory failure (HCC)   Chronic diastolic CHF (congestive heart failure) ([place])   Fever    [name] is a 74 y.o. 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 My concern is that her generator pocket could be infected  #1 FUO: concern for ICD site infection Seen by Dr. Lovena Le as well. Blood cultures NG. TEE pending  Continue to hold antibiotics and await further data and evaluation  #2 M avium: continue current 3 drug regimen.  LOS: 1 day   Alcide Evener 07/17/2019, 1:34 PM

## 2019-07-17 NOTE — Consult Note (Addendum)
Cardiology Consultation:   Patient ID: [name] MRN: [id number]; DOB: 08-30-1945  Admit date: 07/15/2019 Date of Consult: 07/17/2019  Primary Care Provider: Carollee Herter, Alferd Apa, DO Primary Cardiologist: Minus Breeding, MD (previously Dr. Doreatha Lew) Primary Electrophysiologist:  Dr. Caryl Comes   Patient Profile:   [name] is a 74 y.o. female with a hx of PAFib and TIAs, non-obstructive CAD by cath 2011, COPD > recurrent pneumonias >> MAI (follows with Dr. Linus Salmons and Dr. Halford Chessman) on home O2, longstanding h/o anemia (iron def, erythropoietin def) gets regular iron infusions (port R chest, follows with [facility]), chronic CHF, NICM, LBBB > CRT-P with normalization of her EF   who is being seen today for the evaluation of possible pacer pocket infection at the request of Dr. Tommy Medal.   Device information:  MDT CRT-P, implanted 12/21/09 >> gen change 06/19/2019 Dr. Caryl Comes Hx of diaphragmatic stmulation (notes mentions a variety of programming changes to eliminate) Initial implant [id number] was complicated by DVT of LUE  Current generator: Percepta CRT-P A: [id number] implanted 12/21/2009 V: [id number] implanted 12/21/2009 LV: [id number] implanted 12/21/2009   AF Hx GIB on Pradaxa and stopped TIAs 2020 > Eliquis Amiodarone d/c appears secondary to concern given her lung issues years ago  History of Present Illness:   [name] was admitted to [facility] yesterday with fever and CP by notes.  She reports 103 by home visiting nurse and urged to go to the hospital.  In the ER temp was 102.7 and admitted for further evaluation and management.  Her home coreg held with some lower BPs BC drawn  The patient tells me she has some ongoing aching discomfort at the site of her PPM (bulky pressure dressing in place), though no exertional or positional Chest discomfort.  She has baseline mild SOB and cough unchanged. She has not generally felt well for a couple days, tired, no energy.  EP was asked to  evaluate her site with ongoing evaluation.management outpatient since her gen change of a hematoma.   ID was consulted to the case and mentioned concerns of pocket infection, an abscess rather then hematoma, recommended EP consult for consideration of I&D of pocket to send for culture She is pending TEE today BC so far negative (outpatient has been on azithromycin, ethambutol, and rifabutin for her MAI)  She has been afebrile here since the ER (she has been getting percocet)  LABS K+ 4.6 BUN/Creat 23/1.01 HS Trop 21, 19 Lactic Acid 1.2 > 0.9 WBC 4.7 > 4.0 > 3.6 H/H 9/30 Plts 156  COVID/flu negative  BC (x2) negative so far (07/15/2019)    Past Medical History:  Diagnosis Date  . Anemia   . Arthritis    right hip  . Atrial fibrillation ([place])   . CAD (coronary artery disease)    mild disease per cath in 2011  . CHF (congestive heart failure) ([place])   . COPD (chronic obstructive pulmonary disease) (HCC)    continuous O2- 2l  . Diverticulitis   . Elbow fracture, left aug 2012  . Erythropoietin deficiency anemia 06/19/2018  . Fibromyalgia    COUPLE OF TIMES A YEAR-PT HAS EPISODES OF CONFUSION-USUALLY INVOLVES DAY/NIGHT REVERSAL AND EPISODES OF TWITCHING AND FALLS--SEES DR. Jacelyn Grip - NEUROLOGIST-LAST SEEN 07/10/11  . Gastroparesis   . GERD (gastroesophageal reflux disease)   . H/O: GI bleed    from Pradaxa  . Hepatitis    hx of in high school   . HTN (hypertension)  under control; has been on med. x "years"  . Hx MRSA infection   . Hx of gastric ulcer   . Hx of stress fx aug 2011   right hip   . Iron malabsorption 08/14/2017  . LBBB (left bundle branch block)   . Oxygen dependent    2 liters via nasal cannula at all times  . Pacemaker    CRT therapy; followed by Dr. Caryl Comes  . Pleural effusion     s/p right thoracentesis 03/09  . Pneumonia - 03/09, 12/09, 02/10, 06/10   MOST RECENT FEB 2013  . Primary dilated cardiomyopathy (HCC)    EF 45 to 50% per echo in Jan 2012  .  RLS (restless legs syndrome)   . Silent aspiration   . Small bowel obstruction ([place])   . Urinary incontinence     -INTERSTIM IMPLANT NOT FUNCTIONING PER PT  . Venous embolism and thrombosis of subclavian vein ([place])    after pacemaker insertion Oct 2011  . Vitamin B12 deficiency   . Wears glasses    reading    Past Surgical History:  Procedure Laterality Date  . APPENDECTOMY    . BIV PACEMAKER GENERATOR CHANGEOUT N/A 06/19/2019   Procedure: BIV PACEMAKER GENERATOR CHANGEOUT;  Surgeon: Deboraha Sprang, MD;  Location: [place] [facility];  Service: Cardiovascular;  Laterality: N/A;  . BRONCHIAL WASHINGS  05/25/2019   Procedure: BRONCHIAL WASHINGS;  Surgeon: Chesley Mires, MD;  Location: [facility];  Service: Endoscopy;;  . CARDIAC CATHETERIZATION  04/08/2006, 11/16/2009  . CHOLECYSTECTOMY N/A 06/27/2017   Procedure: OPEN CHOLECYSTECTOMY;  Surgeon: Ralene Ok, MD;  Location: [place];  Service: General;  Laterality: N/A;  . COLECTOMY     Intestinal resection (5times)  . COLECTOMY  02/04/2000   ex. lap., intra-abd. subtotal colectomy with ileosigmoid colon anastomosies and lysis of adhesions  . CYSTOSCOPY  11/11/2008  . CYSTOSCOPY W/ RETROGRADES  11/11/2008   right  . CYSTOSCOPY WITH INJECTION  04/30/2006   transurethral collagen injection; incision vaginal stenosis  . ERCP N/A 06/23/2017   Procedure: ENDOSCOPIC RETROGRADE CHOLANGIOPANCREATOGRAPHY (ERCP);  Surgeon: Carol Ada, MD;  Location: [place];  Service: Endoscopy;  Laterality: N/A;  . ERCP N/A 06/24/2017   Procedure: ENDOSCOPIC RETROGRADE CHOLANGIOPANCREATOGRAPHY (ERCP);  Surgeon: [name], MD;  Location: [facility];  Service: Endoscopy;  Laterality: N/A;  . ESOPHAGOGASTRODUODENOSCOPY N/A 02/02/2013   Procedure: ESOPHAGOGASTRODUODENOSCOPY (EGD);  Surgeon: Lafayette Dragon, MD;  Location: [facility];  Service: Endoscopy;  Laterality: N/A;  . EXPLORATORY LAPAROTOMY  04/27/2009   lysis of adhesions, gastrostomy tube  .  GASTROCUTANEOUS FISTULA CLOSURE    . INTERSTIM IMPLANT PLACEMENT  05/28/2006 - stage I   06/05/2006 - stage II  . INTERSTIM IMPLANT PLACEMENT  02/05/2012   Procedure: [name] IMPLANT FIRST STAGE;  Surgeon: Reece Packer, MD;  Location[facility];  Service: Urology;  Laterality: N/A;  Replacement of Interstim Lead     . INTERSTIM IMPLANT REVISION  03/06/2011   Procedure: REVISION OF [name];  Surgeon: Reece Packer, MD;  [facility];  Service: Urology;  Laterality: N/A;  Replacement of Neurostimulator  . INTERSTIM IMPLANT REVISION  10/23/2007  . IR IMAGING GUIDED PORT INSERTION  10/14/2017  . PACEMAKER PLACEMENT  12/21/2009  . Peg removed     with complications  . PORT-A-CATH REMOVAL  07/27/2011   Procedure: REMOVAL PORT-A-CATH;  Surgeon: Rolm Bookbinder, MD;  Location: [place];  Service: General;  Laterality: N/A;  . PORTACATH PLACEMENT  12/16/2009  . SAVORY DILATION N/A 02/02/2013   Procedure: SAVORY DILATION;  Surgeon: Lafayette Dragon, MD;  [facility];  Service: Endoscopy;  Laterality: N/A;  . SMALL INTESTINE SURGERY  05/20/2001   ex. lap., resection of small bowel stricture; gastrostomy; insertion central line  . TONSILLECTOMY  age 34  . TOTAL ABDOMINAL HYSTERECTOMY     complete  . TOTAL HIP ARTHROPLASTY  08/03/2011   Procedure: TOTAL HIP ARTHROPLASTY ANTERIOR APPROACH;  Surgeon: Mcarthur Rossetti, MD;  [facility];  Service: Orthopedics;  Laterality: Right;  [name] VIDEO BRONCHOSCOPY N/A 05/25/2019   Procedure: VIDEO BRONCHOSCOPY WITHOUT FLUORO;  Surgeon: Chesley Mires, MD;  [facility];  Service: Endoscopy;  Laterality: N/A;     Home Medications:  Prior to Admission medications   Medication Sig Start Date End Date Taking? Authorizing Provider  acetaminophen (TYLENOL) 650 MG CR tablet Take 650 mg by mouth every 8 (eight) hours as needed for pain.   Yes [provider]  albuterol (PROVENTIL HFA) 108 (90 Base) MCG/ACT  inhaler Inhale 2 puffs into the lungs every 6 (six) hours as needed for wheezing or shortness of breath.   Yes [provider]  amLODipine (NORVASC) 2.5 MG tablet Take 1 tablet (2.5 mg total) by mouth daily. 09/05/18  Yes Minus Breeding, MD  atorvastatin (LIPITOR) 20 MG tablet TAKE 1 TABLET BY MOUTH ONCE DAILY AT  6PM Patient taking differently: Take 20 mg by mouth every evening.  05/07/19  Yes Roma Schanz R, DO  azithromycin (ZITHROMAX) 500 MG tablet Take 1 tablet (500 mg total) by mouth 3 (three) times a week. Patient taking differently: Take 500 mg by mouth See admin instructions. Take 500 mg by mouth on Tues/Thurs/Sat 06/24/19  Yes Comer, Okey Regal, MD  carvedilol (COREG) 25 MG tablet Take 1 tablet (25 mg total) by mouth 2 (two) times daily. Patient taking differently: Take 25 mg by mouth at bedtime.  03/31/19  Yes Minus Breeding, MD  ethambutol (MYAMBUTOL) 100 MG tablet Take 2 tablets (200 mg total) by mouth 3 (three) times a week. Take with 2 tablets of 400 mg of ethambutol to equal 1000 mg total. Patient taking differently: Take 200 mg by mouth every Monday, Wednesday, and Friday.  06/24/19  Yes Comer, Okey Regal, MD  ethambutol (MYAMBUTOL) 400 MG tablet Take 2 tablets (800 mg total) by mouth 3 (three) times a week. Take with 2 tablets of 100 mg of ethambutol to equal 1000 mg total. Patient taking differently: Take 800 mg by mouth every Monday, Wednesday, and Friday.  06/24/19  Yes Comer, Okey Regal, MD  furosemide (LASIX) 40 MG tablet TAKE 1 TABLET BY MOUTH IN THE MORNING Patient taking differently: Take 40 mg by mouth in the morning.  05/01/19  Yes Minus Breeding, MD  gabapentin (NEURONTIN) 600 MG tablet Take 1 tablet by mouth 4 times daily Patient taking differently: Take 600 mg by mouth 4 (four) times daily.  05/20/19  Yes Roma Schanz R, DO  lidocaine-prilocaine (EMLA) cream Apply 1 application topically as needed. Patient taking differently: Apply 1 application topically as  needed (for port access- to numb).  10/16/17  Yes Ennever, Rudell Cobb, MD  nitroGLYCERIN (NITROSTAT) 0.4 MG SL tablet Place 1 tablet (0.4 mg total) under the tongue every 5 (five) minutes as needed. CHEST PAIN Patient taking differently: Place 0.4 mg under the tongue every 5 (five) minutes as needed for chest pain.  06/01/19  Yes Minus Breeding, MD  oxyCODONE-acetaminophen (PERCOCET)  10-325 MG tablet Take 1 tablet by mouth every 4 (four) hours as needed. PAIN Patient taking differently: Take 1 tablet by mouth every 4 (four) hours as needed for pain.  07/02/19  Yes Roma Schanz R, DO  OXYGEN Inhale 3 L/min into the lungs continuous.    Yes [provider]  pantoprazole (PROTONIX) 40 MG tablet Take 1 tablet by mouth twice daily Patient taking differently: Take 40 mg by mouth 2 (two) times daily before a meal.  04/20/19  Yes Gatha Mayer, MD  promethazine (PHENERGAN) 25 MG tablet Take 25 mg by mouth every 6 (six) hours as needed for nausea or vomiting.   Yes [provider]  rifabutin (MYCOBUTIN) 150 MG capsule Take 2 capsules (300 mg total) by mouth 3 (three) times a week. Patient taking differently: Take 300 mg by mouth every Monday, Wednesday, and Friday.  06/24/19  Yes Comer, Okey Regal, MD  tiZANidine (ZANAFLEX) 4 MG tablet Take 1 tablet (4 mg total) by mouth 3 (three) times daily as needed. for muscle spams Patient taking differently: Take 4 mg by mouth 3 (three) times daily as needed for muscle spasms.  05/07/19  Yes Roma Schanz R, DO  triamcinolone ointment (KENALOG) 0.1 % Apply 1 application topically daily as needed (to affected areas- for psoriasis).  03/19/18  Yes [provider]  umeclidinium bromide (INCRUSE ELLIPTA) 62.5 MCG/INH AEPB Inhale 1 puff into the lungs daily. 09/09/18  Yes Lauraine Rinne, NP  zolpidem (AMBIEN) 5 MG tablet TAKE 1 TABLET BY MOUTH EVERY DAY AT BEDTIME AS NEEDED Patient taking differently: Take 5 mg by mouth at bedtime.  07/13/19  Yes  Ann Held, DO  apixaban (ELIQUIS) 5 MG TABS tablet Take 1 tablet (5 mg total) by mouth 2 (two) times daily. 08/04/18   Ann Held, DO  denosumab (PROLIA) 60 MG/ML SOSY injection Inject 60 mg into the skin every 6 (six) months. 03/25/15   [provider]  iron dextran complex (INFED) 50 MG/ML injection Please give infed infusion (no test dose needed) over 4 hours per pharmacy calculated dose. Ht: 5'2 Wt: 99 pounds Hgb: 12 12/20/10 12/16/18  Lafayette Dragon, MD    Inpatient Medications: Scheduled Meds: . atorvastatin  20 mg Oral QPM  . azithromycin  500 mg Oral Q M,W,F  . carvedilol  12.5 mg Oral BID WC  . Chlorhexidine Gluconate Cloth  6 each Topical Daily  . ethambutol  1,000 mg Oral Q M,W,F  . gabapentin  600 mg Oral TID AC & HS  . pantoprazole  40 mg Oral BID AC  . [START ON 07/18/2019] rifabutin  300 mg Oral Once per day on Tue Thu Sat  . umeclidinium bromide  1 puff Inhalation Daily   Continuous Infusions:   PRN Meds: acetaminophen **OR** acetaminophen, albuterol, fentaNYL (SUBLIMAZE) injection, ondansetron **OR** ondansetron (ZOFRAN) IV, oxyCODONE-acetaminophen **AND** oxyCODONE, sodium chloride flush, tiZANidine, zolpidem  Allergies:    Allergies  Allergen Reactions  . Dabigatran Etexilate Mesylate Other (See Comments)    INTERNAL BLEEDING-pradaxa  . Augmentin [Amoxicillin-Pot Clavulanate] Diarrhea and Nausea Only    Did it involve swelling of the face/tongue/throat, SOB, or low BP? No Did it involve sudden or severe rash/hives, skin peeling, or any reaction on the inside of your mouth or nose? No Did you need to seek medical attention at a hospital or doctor's office? No When did it last happen? 02/2019 If all above answers are "NO", may proceed with  cephalosporin use.   [name] [Pentazocine] Other (See Comments)    Hallucinations     Social History:   Social History   Socioeconomic History  . Marital status: Married    Spouse name: Not  on file  . Number of children: 5  . Years of education: Not on file  . Highest education level: Not on file  Occupational History  . Occupation: disabled    Employer: RETIRED  Tobacco Use  . Smoking status: Former Smoker    Packs/day: 1.00    Years: 20.00    Pack years: 20.00    Quit date: 03/19/1986    Years since quitting: 33.3  . Smokeless tobacco: Never Used  Substance and Sexual Activity  . Alcohol use: No    Alcohol/week: 0.0 standard drinks  . Drug use: No  . Sexual activity: Yes  Other Topics Concern  . Not on file  Social History Narrative   Occupation: Disabled   Married - second time - biologic daughter (in [place]) from first marriage - adopted 4 3 alive with second marriage   Alcohol Use - no   Illicit Drug Use - no   Patient is a former smoker, quit 1988 x50yrs, <1ppd.    Daily Caffeine Use   01/20/2015 - updated   Social Determinants of Health   Financial Resource Strain:   . Difficulty of Paying Living Expenses:   Food Insecurity: No Food Insecurity  . Worried About Charity fundraiser in the Last Year: Never true  . Ran Out of Food in the Last Year: Never true  Transportation Needs: No Transportation Needs  . Lack of Transportation (Medical): No  . Lack of Transportation (Non-Medical): No  Physical Activity:   . Days of Exercise per Week:   . Minutes of Exercise per Session:   Stress:   . Feeling of Stress :   Social Connections:   . Frequency of Communication with Friends and Family:   . Frequency of Social Gatherings with Friends and Family:   . Attends Religious Services:   . Active Member of Clubs or Organizations:   . Attends Archivist Meetings:   [name] Marital Status:   Intimate Partner Violence:   . Fear of Current or Ex-Partner:   . Emotionally Abused:   [name] Physically Abused:   . Sexually Abused:     Family History:    Family History  Problem Relation Age of Onset  . Heart disease Father   . Stroke Mother   . Heart disease Mother    . Heart disease Brother   . Breast cancer Maternal Aunt   . Heart disease Paternal Aunt   . Diabetes Paternal Grandfather   . Colon cancer Neg Hx      ROS:  Please see the history of present illness.  All other ROS reviewed and negative.     Physical Exam/Data:   Vitals:   07/16/19 1935 07/16/19 2312 07/17/19 0358 07/17/19 0905  BP: 113/63 129/71 140/87 124/71  Pulse: 78 81 80 87  Resp: 16 16 16 18   Temp: 98.3 F (36.8 C) 98.1 F (36.7 C) 98.7 F (37.1 C) 99.5 F (37.5 C)  TempSrc: Oral Oral Oral   SpO2: 95% 92% 100% 98%  Weight: 40.1 kg     Height:        Intake/Output Summary (Last 24 hours) at 07/17/2019 1004 Last data filed at 07/17/2019 0700 Gross per 24 hour  Intake 420 ml  Output 1100 ml  Net -680 ml   Last 3 Weights 07/16/2019 07/16/2019 07/15/2019  Weight (lbs) 88 lb 8 oz 90 lb 90 lb 6.2 oz  Weight (kg) 40.143 kg 40.824 kg 41 kg     Body mass index is 17.28 kg/m.  General:  Well nourished, well developed, in no acute distress, frail, very thin body habitus, looks older then her age 66: normal Lymph: no adenopathy Neck: no JVD Endocrine:  No thryomegaly Vascular: No carotid bruits Cardiac:  RRR; soft SM, no gallops or rubs Port R chest Lungs:  CTA b/l, no wheezing, rhonchi or rales  Abd: soft, nontender  Ext: no edema Musculoskeletal:  No deformities Skin: warm and dry  Neuro:  no focal abnormalities noted Psych:  Normal affect   R chest, PPM site: Dressing from yesterday removed No change in appearance, old ecchymosis discoloration, dark over the generator only, remains swollen, is soft, mildly tender to palpation Scab remains, no open areas or drainage No erythema    EKG:  The EKG was personally reviewed and demonstrates:   SR/V paced ST 103bpm, V paced  Telemetry:  Telemetry was personally reviewed and demonstrates:   SR, V paced  Relevant CV Studies:  06/02/2019: TTE Study Conclusions  - Left ventricle: The cavity size was  normal. Systolic function was    normal. The estimated ejection fraction was in the range of 55%    to 60%. Wall motion was normal; there were no regional wall    motion abnormalities. Doppler parameters are consistent with    abnormal left ventricular relaxation (grade 1 diastolic    dysfunction).  - Mitral valve: Structurally normal valve. Chordal calcification.    There was moderate regurgitation.  - Left atrium: The atrium was mildly dilated.  - Pulmonary arteries: Systolic pressure was mildly increased. PA    peak pressure: 33 mm Hg (S).   Impressions:  - No cardiac source of emboli was indentified.     Laboratory Data:  High Sensitivity Troponin:   Recent Labs  Lab 07/15/19 1928 07/15/19 2256  TROPONINIHS 21* 19*     Chemistry Recent Labs  Lab 07/15/19 1928 07/16/19 1921 07/17/19 0412  NA 144 137 137  K 4.1 4.5 4.6  CL 104 100 102  CO2 25 29 28   GLUCOSE 82 115* 88  BUN 21 24* 23  CREATININE 0.98 1.22* 1.01*  CALCIUM 7.6* 7.5* 7.6*  GFRNONAA 57* 44* 55*  GFRAA >60 51* >60  ANIONGAP 15 8 7     Recent Labs  Lab 07/15/19 1928  PROT 6.6  ALBUMIN 3.3*  AST 28  ALT 10  ALKPHOS 67  BILITOT 0.6   Hematology Recent Labs  Lab 07/15/19 1928 07/16/19 1921 07/17/19 0412  WBC 4.7 4.0 3.6*  RBC 3.99 3.25* 3.30*  HGB 11.3* 9.1* 9.3*  HCT 37.7 30.4* 30.9*  MCV 94.5 93.5 93.6  MCH 28.3 28.0 28.2  MCHC 30.0 29.9* 30.1  RDW 15.7* 15.5 15.5  PLT 163 154 156   BNPNo results for input(s): BNP, PROBNP in the last 168 hours.  DDimer No results for input(s): DDIMER in the last 168 hours.   Radiology/Studies:   CT Angio Chest PE W and/or Wo Contrast Result Date: 07/16/2019 CLINICAL DATA:  Chest pain and fever. EXAM: CT ANGIOGRAPHY CHEST WITH CONTRAST TECHNIQUE: Multidetector CT imaging of the chest was performed using the standard protocol during bolus administration of intravenous contrast. Multiplanar CT image reconstructions and MIPs were obtained to evaluate  the vascular anatomy. CONTRAST:  20mL  OMNIPAQUE IOHEXOL 350 MG/ML SOLN COMPARISON:  April 27, 2019 FINDINGS: Cardiovascular: There is a dual lead AICD. There is mild to moderate severity calcification of the thoracic aorta. Satisfactory opacification of the pulmonary arteries to the segmental level. No evidence of pulmonary embolism. Normal heart size. No pericardial effusion. Mediastinum/Nodes: There is mild right hilar lymphadenopathy. Lungs/Pleura: Stable moderate to marked severity areas of diffuse bilateral peribronchovascular nodularity and nodular consolidation are seen. There is no evidence of a pleural effusion or pneumothorax. Upper Abdomen: No acute abnormality. Musculoskeletal: Degenerative changes seen within the thoracic spine. Review of the MIP images confirms the above findings. IMPRESSION: 1. No evidence of acute pulmonary embolism. 2. Stable moderate to marked severity areas of diffuse bilateral peribronchovascular nodularity and nodular consolidation. 3. Aortic atherosclerosis. Aortic Atherosclerosis (ICD10-I70.0). Electronically Signed   By: Virgina Norfolk M.D.   On: 07/16/2019 00:37     DG Chest Port 1 View Result Date: 07/15/2019 CLINICAL DATA:  Cough, shortness of breath and fever. EXAM: PORTABLE CHEST 1 VIEW COMPARISON:  Chest radiograph 10/02/2017. FINDINGS: Right anterior chest wall Port-A-Cath is present with tip projecting over the superior vena cava. Multi lead pacer apparatus overlies the left hemithorax, leads stable in position. Monitoring leads overlie the patient. Stable cardiac and mediastinal contours. Aortic atherosclerosis. Similar-appearing nodular opacities predominately involving the central aspect of the lungs bilaterally. No pleural effusion or pneumothorax. IMPRESSION: Scattered nodular opacities favored to correspond with history of MAI. Electronically Signed   By: Lovey Newcomer M.D.   On: 07/15/2019 19:59     Assessment and Plan:   1. Febrile illness 2.  PPM pocket hematoma post gen change 4/2//21      Dr. Lovena Le has seen and examined the patient this morning No obvious signs of infection at the site, hematoma remains stable in size (by pt's account)  Continue off eliquis, she remains in SR No need for further pressure dressing  Await TEE findings, blood cultures  Will not plan to enter the pocket unless there is indication for device extraction by + cultures or evidence of vegetation by her TEE today   For questions or updates, please contact [facility] Please consult [email or web address] for contact info under   Signed, Baldwin Jamaica, PA-C  07/17/2019 10:04 AM  EP attending  Patient seen and examined.  Agree with the findings as noted above.  The patient presents with a fever and noted to have a swollen pacemaker pocket.  She is a very pleasant 74 year old woman with a history of chronic systolic heart failure and left bundle branch block status post biventricular pacemaker insertion who underwent pacemaker generator change out approximately 4 weeks ago.  She was on systemic anticoagulation.  The patient developed a pocket hematoma and she has had bandage and discontinuation of her anticoagulation.  Unfortunately the patient has an indwelling right Port-A-Cath and she is using this for intravenous iron infusions.  The patient has had subjective fevers and a temperature of 102.  Her blood cultures have remained sterile.  She has continued to have a pocket hematoma although it appears that this has been resolving for several days.  On physical exam she is a frail-appearing woman in no acute distress the exam is accurately noted above.  I should note that her pacemaker pocket is fluctuant but nontender and not particularly warm.  There is no drainage.  There is no erythema.  It appears to have an underlying hematoma present.  Her transesophageal echo is pending at this time.  Labs  are unrevealing.  Her blood cultures have been  sterile.  Assessment and plan 1.  Pacemaker pocket swelling with concerns of possible indolent infection -the patient's pocket itself does not particularly look bad to me.  I think she has a resolving hematoma.  I do not think there is an infection in her pacemaker pocket.  The patient has had a fever and agree with plans for work-up including transesophageal echo.  Her blood cultures have remained sterile.  She is not dependent on her pacemaker and if needed extraction would be an option although we would prefer to avoid this if at all possible as per surgical risk is not trivial. In addition, I would not recommend trying to drain or culture the incision by needle aspirate 2.  Chronic systolic heart failure -she is status post biventricular pacemaker with normalization of her left ventricular systolic function.  She will continue on guideline directed medical therapy.  Cristopher Peru, MD

## 2019-07-17 NOTE — Interval H&P Note (Signed)
History and Physical Interval Note:  07/17/2019 1:45 PM  [name]  has presented today for surgery, with the diagnosis of BACTEREMIA.  The various methods of treatment have been discussed with the patient and family. After consideration of risks, benefits and other options for treatment, the patient has consented to  Procedure(s): TRANSESOPHAGEAL ECHOCARDIOGRAM (TEE) (N/A) as a surgical intervention.  The patient's history has been reviewed, patient examined, no change in status, stable for surgery.  I have reviewed the patient's chart and labs.  Questions were answered to the patient's satisfaction.     Donato Heinz

## 2019-07-17 NOTE — Progress Notes (Signed)
PROGRESS NOTE  [name] AJO:[id number] DOB: [birth year] DOA: 07/15/2019 PCP: Ann Held, DO   LOS: 1 day   Brief Narrative / Interim history: Patient is a 74 year old female with history of PAF, COPD on 3 L at home, recurrent pneumonias ultimately determined to be to MAI.  She saw ID as an outpatient and was started on antibiotics a month ago, however she had persistent symptoms and recurrent fevers at home and came to the emergency room.  She also has an AICD/PPM replaced on 4/2 due to end of battery.  Subjective / 24h Interval events: She is doing well this morning, denies any fevers or chills overnight.  No chest pain.  No abdominal pain, no nausea or vomiting  Assessment & Plan: Principal Problem Febrile illness with recently diagnosed MAI pneumonia 1 month ago-ID was consulted, patient is to continue rifabutin, azithromycin, ethambutol.  CT scan of the chest on admission looks stable however he did not show any significant improvement.  There is concern about the fever coming from recently replaced AICD/PPM and EP was consulted, patient is to undergo TEE today, and blood cultures have been obtained on admission and will continue to monitor.  Active Problems Pacemaker-CRT-[employer] -in place, just changed on 4/2.  Postop course complicated by concern for small hematoma.  EP consulted, plan for TEE today.  Chronic hypoxic respiratory failure with underlying COPD-continue home oxygen, this is baseline, no wheezing, no respiratory distress today  Chronic diastolic CHF -Lasix is on hold due to soft blood pressure on admission  Essential hypertension -Hold Norvasc due to soft blood pressures on admission, blood pressure improved and she is back on her Coreg  History of A. fib, paroxysmal -She is normal sinus, resume Coreg, defer anticoagulation to cardiology given small hematoma at [facility]  Nonischemic cardiomyopathy -interval resolution  Scheduled Meds: .  atorvastatin  20 mg Oral QPM  . azithromycin  500 mg Oral Q M,W,F  . carvedilol  12.5 mg Oral BID WC  . Chlorhexidine Gluconate Cloth  6 each Topical Daily  . ethambutol  1,000 mg Oral Q M,W,F  . gabapentin  600 mg Oral TID AC & HS  . pantoprazole  40 mg Oral BID AC  . [START ON 07/18/2019] rifabutin  300 mg Oral Once per day on Tue Thu Sat  . umeclidinium bromide  1 puff Inhalation Daily   Continuous Infusions: PRN Meds:.acetaminophen **OR** acetaminophen, albuterol, fentaNYL (SUBLIMAZE) injection, ondansetron **OR** ondansetron (ZOFRAN) IV, oxyCODONE-acetaminophen **AND** oxyCODONE, sodium chloride flush, tiZANidine, zolpidem  DVT prophylaxis: SCDs Code Status: Full code Family Communication: No family at bedside  Status is: Inpatient  Remains inpatient appropriate because:Ongoing diagnostic testing needed not appropriate for outpatient work up   Dispo: The patient is from: Home              Anticipated d/c is to: Home              Anticipated d/c date is: 2 days              Patient currently is not medically stable to d/c.  Consultants:  Cardiology-EP Infectious disease  Procedures:  TEE-pending  Microbiology  Blood cultures-pending, no growth to date  Antimicrobials: MAI treatment  Objective: Vitals:   07/16/19 1935 07/16/19 2312 07/17/19 0358 07/17/19 0905  BP: 113/63 129/71 140/87 124/71  Pulse: 78 81 80 87  Resp: 16 16 16 18   Temp: 98.3 F (36.8 C) 98.1 F (36.7 C) 98.7 F (37.1 C)  99.5 F (37.5 C)  TempSrc: Oral Oral Oral   SpO2: 95% 92% 100% 98%  Weight: 40.1 kg     Height:        Intake/Output Summary (Last 24 hours) at 07/17/2019 1120 Last data filed at 07/17/2019 0700 Gross per 24 hour  Intake 420 ml  Output 1100 ml  Net -680 ml   Filed Weights   07/15/19 1909 07/16/19 0948 07/16/19 1935  Weight: 41 kg 40.8 kg 40.1 kg    Examination:  Constitutional: NAD Eyes: no scleral icterus ENMT: Mucous membranes are moist.  Neck: normal,  supple Respiratory: clear to auscultation bilaterally, no wheezing, no crackles.  Cardiovascular: Regular rate and rhythm, no murmurs / rubs / gallops. No LE edema. Good peripheral pulses Abdomen: non distended, no tenderness. Bowel sounds positive.  Musculoskeletal: no clubbing / cyanosis.  Skin: no rashes, small hematoma noted at the pacemaker insertion site, mild tenderness to palpation, no cellulitic changes or discharges noted Neurologic: Nonfocal  Data Reviewed: I have independently reviewed following labs and imaging studies   CBC: Recent Labs  Lab 07/15/19 1928 07/16/19 1921 07/17/19 0412  WBC 4.7 4.0 3.6*  NEUTROABS 4.0  --   --   HGB 11.3* 9.1* 9.3*  HCT 37.7 30.4* 30.9*  MCV 94.5 93.5 93.6  PLT 163 154 433   Basic Metabolic Panel: Recent Labs  Lab 07/15/19 1928 07/16/19 1921 07/17/19 0412  NA 144 137 137  K 4.1 4.5 4.6  CL 104 100 102  CO2 25 29 28   GLUCOSE 82 115* 88  BUN 21 24* 23  CREATININE 0.98 1.22* 1.01*  CALCIUM 7.6* 7.5* 7.6*   Liver Function Tests: Recent Labs  Lab 07/15/19 1928  AST 28  ALT 10  ALKPHOS 67  BILITOT 0.6  PROT 6.6  ALBUMIN 3.3*   Coagulation Profile: Recent Labs  Lab 07/15/19 1958  INR 1.1   HbA1C: No results for input(s): HGBA1C in the last 72 hours. CBG: No results for input(s): GLUCAP in the last 168 hours.  Recent Results (from the past 240 hour(s))  Blood Culture (routine x 2)     Status: None (Preliminary result)   Collection Time: 07/15/19  7:21 PM   Specimen: BLOOD RIGHT FOREARM  Result Value Ref Range Status   Specimen Description BLOOD RIGHT FOREARM  Final   Special Requests   Final    BOTTLES DRAWN AEROBIC ONLY Blood Culture adequate volume   Culture   Final    NO GROWTH < 12 HOURS Performed at [facility], [address] [place], [place] [address]    Report Status PENDING  Incomplete  Blood Culture (routine x 2)     Status: None (Preliminary result)   Collection Time: 07/15/19  7:32 PM    Specimen: BLOOD LEFT HAND  Result Value Ref Range Status   Specimen Description BLOOD LEFT HAND  Final   Special Requests   Final    BOTTLES DRAWN AEROBIC AND ANAEROBIC Blood Culture adequate volume   Culture   Final    NO GROWTH < 12 HOURS Performed at [facility], [place] [address], [place], [place] [address]    Report Status PENDING  Incomplete  Respiratory Panel by RT PCR (Flu A&B, Covid) - Nasopharyngeal Swab     Status: None   Collection Time: 07/15/19  7:55 PM   Specimen: Nasopharyngeal Swab  Result Value Ref Range Status   SARS Coronavirus 2 by RT PCR NEGATIVE NEGATIVE Final  Comment: (NOTE) SARS-CoV-2 target nucleic acids are NOT DETECTED. The SARS-CoV-2 RNA is generally detectable in upper respiratoy specimens during the acute phase of infection. The lowest concentration of SARS-CoV-2 viral copies this assay can detect is 131 copies/mL. A negative result does not preclude SARS-Cov-2 infection and should not be used as the sole basis for treatment or other patient management decisions. A negative result may occur with  improper specimen collection/handling, submission of specimen other than nasopharyngeal swab, presence of viral mutation(s) within the areas targeted by this assay, and inadequate number of viral copies (<131 copies/mL). A negative result must be combined with clinical observations, patient history, and epidemiological information. The expected result is Negative. Fact Sheet for Patients:  PinkCheek.be Fact Sheet for Healthcare Providers:  GravelBags.it This test is not yet ap proved or cleared by the [place] FDA and  has been authorized for detection and/or diagnosis of SARS-CoV-2 by FDA under an Emergency Use Authorization (EUA). This EUA will remain  in effect (meaning this test can be used) for the duration of the COVID-19 declaration under Section 564(b)(1) of the Act, 21  U.S.C. section 360bbb-3(b)(1), unless the authorization is terminated or revoked sooner.    Influenza A by PCR NEGATIVE NEGATIVE Final   Influenza B by PCR NEGATIVE NEGATIVE Final    Comment: (NOTE) The Xpert Xpress SARS-CoV-2/FLU/RSV assay is intended as an aid in  the diagnosis of influenza from Nasopharyngeal swab specimens and  should not be used as a sole basis for treatment. Nasal washings and  aspirates are unacceptable for Xpert Xpress SARS-CoV-2/FLU/RSV  testing. Fact Sheet for Patients: PinkCheek.be Fact Sheet for Healthcare Providers: GravelBags.it This test is not yet approved or cleared by the [place] FDA and  has been authorized for detection and/or diagnosis of SARS-CoV-2 by  FDA under an Emergency Use Authorization (EUA). This EUA will remain  in effect (meaning this test can be used) for the duration of the  Covid-19 declaration under Section 564(b)(1) of the Act, 21  U.S.C. section 360bbb-3(b)(1), unless the authorization is  terminated or revoked. Performed at [facility], [place] [address], [place], [place] [address]      Radiology Studies: No results found.   Marzetta Board, MD, PhD [facility]  Between 7 am - 7 pm I am available, please contact me via Amion or Securechat  Between 7 pm - 7 am I am not available, please contact night coverage MD/APP via Amion

## 2019-07-17 NOTE — CV Procedure (Signed)
     TRANSESOPHAGEAL ECHOCARDIOGRAM   NAME:  [name]   MRN: [id number] DOB:  [birth year]   ADMIT DATE: 07/15/2019  INDICATIONS: Fever  PROCEDURE:   Informed consent was obtained prior to the procedure. The risks, benefits and alternatives for the procedure were discussed and the patient comprehended these risks.  Risks include, but are not limited to, cough, sore throat, vomiting, nausea, somnolence, esophageal and stomach trauma or perforation, bleeding, low blood pressure, aspiration, pneumonia, infection, trauma to the teeth and death.    After a procedural time-out, the oropharynx was anesthetized and the patient was sedated by the anesthesia service. The transesophageal probe was inserted in the esophagus and stomach without difficulty and multiple views were obtained. Anesthesia was monitored by Garrison Columbus, CRNA.    COMPLICATIONS:    There were no immediate complications.  FINDINGS:  No vegetation seen   Oswaldo Milian MD [facility]  [address], [place], [place] [address] [phone]   2:55 PM

## 2019-07-17 NOTE — Progress Notes (Addendum)
  Echocardiogram Echocardiogram Transesophageal has been performed.  Darryel Diodato A Charmika Macdonnell 07/17/2019, 2:47 PM

## 2019-07-17 NOTE — Patient Outreach (Signed)
  [facility]) Care Management Chronic Special Needs Program    07/17/2019  Name: [name], DOB: Nov 11, 1945  MRN: [id number]   [name] is enrolled in a chronic special needs plan for Heart Failure. Client admitted to [facility] on 07/16/19 with dx of fever and MAI (mycobacterium avium-intracellulare) infection. Individualized care plan sent to [facility] for admission  [facility] will continue to follow.  Client active with [facility] and [facility] aware of admission per review of [facility] records.   Peter Garter RN, [name], CDE Chronic Care Management Coordinator [facility] [phone]

## 2019-07-18 LAB — COMPREHENSIVE METABOLIC PANEL
ALT: 9 U/L (ref 0–44)
AST: 19 U/L (ref 15–41)
Albumin: 2.5 g/dL — ABNORMAL LOW (ref 3.5–5.0)
Alkaline Phosphatase: 50 U/L (ref 38–126)
Anion gap: 8 (ref 5–15)
BUN: 17 mg/dL (ref 8–23)
CO2: 27 mmol/L (ref 22–32)
Calcium: 7.8 mg/dL — ABNORMAL LOW (ref 8.9–10.3)
Chloride: 105 mmol/L (ref 98–111)
Creatinine, Ser: 0.88 mg/dL (ref 0.44–1.00)
GFR calc Af Amer: >60 mL/min (ref >60)
GFR calc non Af Amer: >60 mL/min (ref >60)
Glucose, Bld: 105 mg/dL — ABNORMAL HIGH (ref 70–99)
Potassium: 4.3 mmol/L (ref 3.5–5.1)
Sodium: 140 mmol/L (ref 135–145)
Total Bilirubin: 0.7 mg/dL (ref 0.3–1.2)
Total Protein: 5.7 g/dL — ABNORMAL LOW (ref 6.5–8.1)

## 2019-07-18 LAB — CBC
HCT: 30.7 % — ABNORMAL LOW (ref 36.0–46.0)
Hemoglobin: 9 g/dL — ABNORMAL LOW (ref 12.0–15.0)
MCH: 27.7 pg (ref 26.0–34.0)
MCHC: 29.3 g/dL — ABNORMAL LOW (ref 30.0–36.0)
MCV: 94.5 fL (ref 80.0–100.0)
Platelets: 149 10*3/uL — ABNORMAL LOW (ref 150–400)
RBC: 3.25 MIL/uL — ABNORMAL LOW (ref 3.87–5.11)
RDW: 15.3 % (ref 11.5–15.5)
WBC: 3.3 10*3/uL — ABNORMAL LOW (ref 4.0–10.5)
nRBC: 0 % (ref 0.0–0.2)

## 2019-07-18 LAB — URINE CULTURE: Culture: >=100000 — AB

## 2019-07-18 LAB — MAGNESIUM: Magnesium: 2 mg/dL (ref 1.7–2.4)

## 2019-07-18 MED ORDER — CEPHALEXIN 500 MG PO CAPS: 500.0000 mg | ORAL_CAPSULE | Freq: Four times a day (QID) | ORAL | 0 refills | Status: DC

## 2019-07-18 MED ORDER — HEPARIN SOD (PORK) LOCK FLUSH 100 UNIT/ML IV SOLN
500.0000 [IU] | INTRAVENOUS | Status: AC | PRN
  Administered 2019-07-18: 500 [IU]
  Filled 2019-07-18: qty 5

## 2019-07-18 NOTE — Plan of Care (Signed)
  Problem: Education: Goal: Knowledge of General Education information will improve Description Including pain rating scale, medication(s)/side effects and non-pharmacologic comfort measures Outcome: Progressing   

## 2019-07-18 NOTE — Discharge Summary (Signed)
Physician Discharge Summary  RAMSHA LONIGRO VQQ:[id number] DOB: 1945-11-04 DOA: 07/15/2019  PCP: Ann Held, DO  Admit date: 07/15/2019 Discharge date: 07/18/2019  Admitted From: home Disposition:  home  Recommendations for Outpatient Follow-up:  1. Follow up with PCP in 1-2 weeks 2. Follow-up with cardiology as scheduled 3. Follow-up with infectious disease as scheduled  Home Health: None Equipment/Devices: None  Discharge Condition: Stable CODE STATUS: Full code Diet recommendation: Heart healthy diet  HPI: Per admitting MD, TRACYE SZUCH is a 74 y.o. female with medical history significant of PAF (eliquis on hold since earlier this month), COPD, 3L home O2 baseline, recurrent PNAs ultimately determined to be due to MAI. In March pt had bronchoscopy done, BAL would ultimately come back positive for MAI.  Pt started on rifabutin with azithromycin and ethambutol 3 times weekly as of 4/7. Pt had AICD / PPM replaced on 4/2 (end of battery life). Pt presents to ED with c/o fever and CP.  Symptoms onset this evening.  No increased SOB, does have some cough.She has had pain like this in the past but did not seek medical treatment at that time because "I didn't want to make a fuss" but states it wasn't recent. She has been taking her home antibiotics but did miss her doses this morning. States she hasnt been feeling great for past couple of days, and actually hasnt taken coreg since the 26th. ED Course: WBC nl, Tm 102.7, CT chest shows basically unchanged MAI, no PE. BP running low 100s.  Pt non-toxic appearing.  Lactate nl. BCx pending, hospitalist asked to admit.   Hospital Course / Discharge diagnoses: Principal Problem Febrile illness with recently diagnosed MAI pneumonia 1 month ago-patient was admitted to the hospital with febrile illness.  Infectious disease consulted and followed patient while hospitalized.  She was maintained on her MAI antibiotics.  Blood cultures were  obtained in the ER without growth at the time of discharge.  Patient recently had a pacemaker replaced on 4/2, postop course was complicated by small amount of hematoma for which her Eliquis has been held.  Cardiology was consulted and evaluated pacemaker, patient underwent a TEE and did not show any evidence of vegetations/active infections.  There is some concern still regarding whether she has a pocket infection or not per infectious disease.  After discussing with Dr. Tommy Medal, he recommended patient be placed on Keflex for 10 days at the time of discharge.  Active Problems Pacemaker-CRT-[employer]-in place, just changed on 4/2.  Patient is off her Eliquis due to hematoma, she will follow-up with cardiology as an outpatient Chronic hypoxic respiratory failure with underlying COPD-continue home oxygen, this is baseline, no wheezing, no respiratory distress today Chronic diastolic CHF -resume home medications on discharge Essential hypertension -resume home medications History of A. fib, paroxysmal -She is normal sinus, resume Coreg, defer anticoagulation to cardiology as an outpatient given small hematoma at the [facility] site Urinary tract infection-patient initially did report any symptoms, however on further questioning she did express that had some dysuria.  She grew Klebsiella which will be covered by Keflex as above. Nonischemic cardiomyopathy-interval resolution  Discharge Instructions   Allergies as of 07/18/2019      Reactions   Dabigatran Etexilate Mesylate Other (See Comments)   INTERNAL BLEEDING-pradaxa   Augmentin [amoxicillin-pot Clavulanate] Diarrhea, Nausea Only   Did it involve swelling of the face/tongue/throat, SOB, or low BP? No Did it involve sudden or severe rash/hives, skin peeling, or any reaction on  the inside of your mouth or nose? No Did you need to seek medical attention at a hospital or doctor's office? No When did it last happen?02/2019 If all above answers are "NO",  may proceed with cephalosporin use.   Talwin [pentazocine] Other (See Comments)   Hallucinations      Medication List    STOP taking these medications   apixaban 5 MG Tabs tablet Commonly known as: Eliquis     TAKE these medications   acetaminophen 650 MG CR tablet Commonly known as: TYLENOL Take 650 mg by mouth every 8 (eight) hours as needed for pain.   amLODipine 2.5 MG tablet Commonly known as: NORVASC Take 1 tablet (2.5 mg total) by mouth daily.   atorvastatin 20 MG tablet Commonly known as: LIPITOR TAKE 1 TABLET BY MOUTH ONCE DAILY AT  6PM What changed:   how much to take  how to take this  when to take this  additional instructions   azithromycin 500 MG tablet Commonly known as: Zithromax Take 1 tablet (500 mg total) by mouth 3 (three) times a week. What changed:   when to take this  additional instructions   carvedilol 25 MG tablet Commonly known as: COREG Take 1 tablet (25 mg total) by mouth 2 (two) times daily. What changed: when to take this   cephALEXin 500 MG capsule Commonly known as: KEFLEX Take 1 capsule (500 mg total) by mouth 4 (four) times daily for 10 days.   denosumab 60 MG/ML Sosy injection Commonly known as: PROLIA Inject 60 mg into the skin every 6 (six) months.   ethambutol 400 MG tablet Commonly known as: MYAMBUTOL Take 2 tablets (800 mg total) by mouth 3 (three) times a week. Take with 2 tablets of 100 mg of ethambutol to equal 1000 mg total. What changed:   when to take this  additional instructions   ethambutol 100 MG tablet Commonly known as: MYAMBUTOL Take 2 tablets (200 mg total) by mouth 3 (three) times a week. Take with 2 tablets of 400 mg of ethambutol to equal 1000 mg total. What changed:   when to take this  additional instructions   furosemide 40 MG tablet Commonly known as: LASIX TAKE 1 TABLET BY MOUTH IN THE MORNING What changed: when to take this   gabapentin 600 MG tablet Commonly known as:  NEURONTIN Take 1 tablet by mouth 4 times daily What changed: See the new instructions.   Incruse Ellipta 62.5 MCG/INH Aepb Generic drug: umeclidinium bromide Inhale 1 puff into the lungs daily.   lidocaine-prilocaine cream Commonly known as: EMLA Apply 1 application topically as needed. What changed: reasons to take this   nitroGLYCERIN 0.4 MG SL tablet Commonly known as: NITROSTAT Place 1 tablet (0.4 mg total) under the tongue every 5 (five) minutes as needed. CHEST PAIN What changed:   reasons to take this  additional instructions   oxyCODONE-acetaminophen 10-325 MG tablet Commonly known as: PERCOCET Take 1 tablet by mouth every 4 (four) hours as needed. PAIN What changed:   reasons to take this  additional instructions   OXYGEN Inhale 3 L/min into the lungs continuous.   pantoprazole 40 MG tablet Commonly known as: PROTONIX Take 1 tablet by mouth twice daily What changed: when to take this   promethazine 25 MG tablet Commonly known as: PHENERGAN Take 25 mg by mouth every 6 (six) hours as needed for nausea or vomiting.   Proventil HFA 108 (90 Base) MCG/ACT inhaler Generic drug: albuterol Inhale 2  puffs into the lungs every 6 (six) hours as needed for wheezing or shortness of breath.   rifabutin 150 MG capsule Commonly known as: MYCOBUTIN Take 2 capsules (300 mg total) by mouth 3 (three) times a week. What changed: when to take this   tiZANidine 4 MG tablet Commonly known as: ZANAFLEX Take 1 tablet (4 mg total) by mouth 3 (three) times daily as needed. for muscle spams What changed:   reasons to take this  additional instructions   triamcinolone ointment 0.1 % Commonly known as: KENALOG Apply 1 application topically daily as needed (to affected areas- for psoriasis).   zolpidem 5 MG tablet Commonly known as: AMBIEN TAKE 1 TABLET BY MOUTH EVERY DAY AT BEDTIME AS NEEDED What changed: See the new instructions.         Consultations:  Cardiology  ID  Procedures/Studies:  TEE IMPRESSIONS  1. Left ventricular ejection fraction, by estimation, is 60 to 65%. The left ventricle has normal function. The left ventricle has no regional wall motion abnormalities. There is mild left ventricular hypertrophy.  2. Right ventricular systolic function is normal. The right ventricular size is normal.  3. Left atrial size was mildly dilated. No left atrial/left atrial appendage thrombus was detected.  4. The mitral valve is normal in structure. Mild mitral valve regurgitation.  5. The aortic valve is tricuspid. Aortic valve regurgitation is not visualized.  6. No vegetation seen   CT Angio Chest PE W and/or Wo Contrast  Result Date: 07/16/2019 CLINICAL DATA:  Chest pain and fever. EXAM: CT ANGIOGRAPHY CHEST WITH CONTRAST TECHNIQUE: Multidetector CT imaging of the chest was performed using the standard protocol during bolus administration of intravenous contrast. Multiplanar CT image reconstructions and MIPs were obtained to evaluate the vascular anatomy. CONTRAST:  53m OMNIPAQUE IOHEXOL 350 MG/ML SOLN COMPARISON:  April 27, 2019 FINDINGS: Cardiovascular: There is a dual lead AICD. There is mild to moderate severity calcification of the thoracic aorta. Satisfactory opacification of the pulmonary arteries to the segmental level. No evidence of pulmonary embolism. Normal heart size. No pericardial effusion. Mediastinum/Nodes: There is mild right hilar lymphadenopathy. Lungs/Pleura: Stable moderate to marked severity areas of diffuse bilateral peribronchovascular nodularity and nodular consolidation are seen. There is no evidence of a pleural effusion or pneumothorax. Upper Abdomen: No acute abnormality. Musculoskeletal: Degenerative changes seen within the thoracic spine. Review of the MIP images confirms the above findings. IMPRESSION: 1. No evidence of acute pulmonary embolism. 2. Stable moderate to marked  severity areas of diffuse bilateral peribronchovascular nodularity and nodular consolidation. 3. Aortic atherosclerosis. Aortic Atherosclerosis (ICD10-I70.0). Electronically Signed   By: TVirgina NorfolkM.D.   On: 07/16/2019 00:37   DG Chest Port 1 View  Result Date: 07/15/2019 CLINICAL DATA:  Cough, shortness of breath and fever. EXAM: PORTABLE CHEST 1 VIEW COMPARISON:  Chest radiograph 10/02/2017. FINDINGS: Right anterior chest wall Port-A-Cath is present with tip projecting over the superior vena cava. Multi lead pacer apparatus overlies the left hemithorax, leads stable in position. Monitoring leads overlie the patient. Stable cardiac and mediastinal contours. Aortic atherosclerosis. Similar-appearing nodular opacities predominately involving the central aspect of the lungs bilaterally. No pleural effusion or pneumothorax. IMPRESSION: Scattered nodular opacities favored to correspond with history of MAI. Electronically Signed   By: DLovey NewcomerM.D.   On: 07/15/2019 19:59   ECHO TEE  Result Date: 07/17/2019    TRANSESOPHOGEAL ECHO REPORT   Patient Name:   DMASSA PEDate of Exam: 07/17/2019 Medical Rec #:  [id number]  Height:       60.0 in Accession #:    [id number]      Weight:       88.5 lb Date of Birth:  1945/06/14       BSA:          1.320 m Patient Age:    1 years        BP:           114/61 mmHg Patient Gender: F               HR:           77 bpm. Exam Location:  Inpatient Procedure: Transesophageal Echo, Cardiac Doppler and Color Doppler Indications:     Bacteremia 790.7 / R78.81  History:         Patient has prior history of Echocardiogram examinations, most                  recent 06/01/2017. CHF, CAD, COPD and TIA; Risk                  Factors:Hypertension and Dyslipidemia.  Sonographer:     Jaquita Folds Referring Phys:  [place] Phys: Oswaldo Milian MD PROCEDURE: The transesophogeal probe was passed without difficulty through the esophogus of the  patient. Sedation performed by different physician. The patient was monitored while under deep sedation. Anesthestetic sedation was provided intravenously by Anesthesiology: 128m of Propofol. The patient developed no complications during the procedure. IMPRESSIONS  1. Left ventricular ejection fraction, by estimation, is 60 to 65%. The left ventricle has normal function. The left ventricle has no regional wall motion abnormalities. There is mild left ventricular hypertrophy.  2. Right ventricular systolic function is normal. The right ventricular size is normal.  3. Left atrial size was mildly dilated. No left atrial/left atrial appendage thrombus was detected.  4. The mitral valve is normal in structure. Mild mitral valve regurgitation.  5. The aortic valve is tricuspid. Aortic valve regurgitation is not visualized.  6. No vegetation seen FINDINGS  Left Ventricle: Left ventricular ejection fraction, by estimation, is 60 to 65%. The left ventricle has normal function. The left ventricle has no regional wall motion abnormalities. The left ventricular internal cavity size was normal in size. There is  mild left ventricular hypertrophy. Right Ventricle: The right ventricular size is normal. Right vetricular wall thickness was not assessed. Right ventricular systolic function is normal. The tricuspid regurgitant velocity is 2.01 m/s, and with an assumed right atrial pressure of 10 mmHg, the estimated right ventricular systolic pressure is 296.7mmHg. Left Atrium: Left atrial size was mildly dilated. No left atrial/left atrial appendage thrombus was detected. Right Atrium: Right atrial size was normal in size. Prominent Chiari network. Pericardium: There is no evidence of pericardial effusion. Mitral Valve: The mitral valve is normal in structure. Mild mitral valve regurgitation. Tricuspid Valve: The tricuspid valve is normal in structure. Tricuspid valve regurgitation is mild. Aortic Valve: The aortic valve is  tricuspid. Aortic valve regurgitation is not visualized. Pulmonic Valve: The pulmonic valve was grossly normal. Pulmonic valve regurgitation is trivial. Aorta: The aortic root and ascending aorta are structurally normal, with no evidence of dilitation. IAS/Shunts: No atrial level shunt detected by color flow Doppler. Additional Comments: A pacer wire is visualized.  TRICUSPID VALVE TR Peak grad:   16.2 mmHg TR Vmax:        201.00 cm/s COswaldo MilianMD Electronically signed by COswaldo MilianMD Signature  Date/Time: 07/17/2019/5:00:40 PM    Final    CUP PACEART INCLINIC DEVICE CHECK  Result Date: 06/30/2019 Wound check appointment. Steri-strips removed. Wound without redness. Edema present, hematoma soft. Bruising present at incision site that extends along rib cage to left axilla region. Incision edges approximated, wound well healed. Pocket Pal placed ,+ Eliquis, placed on hold until Dr Caryl Comes consulted. Normal device function. Thresholds, sensing, and impedances consistent with implant measurements. Device programmed at 3.5V/auto capture programmed on for extra safety margin until 3 month visit.FFRWs present , blanking programmed to partial +. Histogram distribution appropriate for patient and level of activity. No mode switches or high ventricular rates noted. Patient educated about wound care, arm mobility, lifting restrictions. ROV in 1 week for wound check. Follow up with Dr Caryl Comes  09/22/19. Next remote due on 09/18/19 .     Subjective: - no chest pain, shortness of breath, no abdominal pain, nausea or vomiting.   Discharge Exam: BP (!) 121/102 (BP Location: Left Arm)   Pulse 68   Temp 98.1 F (36.7 C) (Oral)   Resp 18   Ht 5' (1.524 m)   Wt 39.5 kg   SpO2 96%   BMI 16.99 kg/m   General: Pt is alert, awake, not in acute distress Cardiovascular: RRR, S1/S2 +, no rubs, no gallops Respiratory: CTA bilaterally, no wheezing, no rhonchi Abdominal: Soft, NT, ND, bowel sounds  + Extremities: no edema, no cyanosis    The results of significant diagnostics from this hospitalization (including imaging, microbiology, ancillary and laboratory) are listed below for reference.     Microbiology: Recent Results (from the past 240 hour(s))  Blood Culture (routine x 2)     Status: None (Preliminary result)   Collection Time: 07/15/19  7:21 PM   Specimen: BLOOD RIGHT FOREARM  Result Value Ref Range Status   Specimen Description BLOOD RIGHT FOREARM  Final   Special Requests   Final    BOTTLES DRAWN AEROBIC ONLY Blood Culture adequate volume   Culture   Final    NO GROWTH 3 DAYS Performed at [facility], [address] [place], [place] [address]    Report Status PENDING  Incomplete  Blood Culture (routine x 2)     Status: None (Preliminary result)   Collection Time: 07/15/19  7:32 PM   Specimen: BLOOD LEFT HAND  Result Value Ref Range Status   Specimen Description BLOOD LEFT HAND  Final   Special Requests   Final    BOTTLES DRAWN AEROBIC AND ANAEROBIC Blood Culture adequate volume   Culture   Final    NO GROWTH 3 DAYS Performed at [facility], [place] [address], [place], [place] [address]    Report Status PENDING  Incomplete  Respiratory Panel by RT PCR (Flu A&B, Covid) - Nasopharyngeal Swab     Status: None   Collection Time: 07/15/19  7:55 PM   Specimen: Nasopharyngeal Swab  Result Value Ref Range Status   SARS Coronavirus 2 by RT PCR NEGATIVE NEGATIVE Final    Comment: (NOTE) SARS-CoV-2 target nucleic acids are NOT DETECTED. The SARS-CoV-2 RNA is generally detectable in upper respiratoy specimens during the acute phase of infection. The lowest concentration of SARS-CoV-2 viral copies this assay can detect is 131 copies/mL. A negative result does not preclude SARS-Cov-2 infection and should not be used as the sole basis for treatment or other patient management decisions. A negative result may occur with  improper specimen  collection/handling, submission of specimen other  than nasopharyngeal swab, presence of viral mutation(s) within the areas targeted by this assay, and inadequate number of viral copies (<131 copies/mL). A negative result must be combined with clinical observations, patient history, and epidemiological information. The expected result is Negative. Fact Sheet for Patients:  PinkCheek.be Fact Sheet for Healthcare Providers:  GravelBags.it This test is not yet ap proved or cleared by the [place] FDA and  has been authorized for detection and/or diagnosis of SARS-CoV-2 by FDA under an Emergency Use Authorization (EUA). This EUA will remain  in effect (meaning this test can be used) for the duration of the COVID-19 declaration under Section 564(b)(1) of the Act, 21 U.S.C. section 360bbb-3(b)(1), unless the authorization is terminated or revoked sooner.    Influenza A by PCR NEGATIVE NEGATIVE Final   Influenza B by PCR NEGATIVE NEGATIVE Final    Comment: (NOTE) The Xpert Xpress SARS-CoV-2/FLU/RSV assay is intended as an aid in  the diagnosis of influenza from Nasopharyngeal swab specimens and  should not be used as a sole basis for treatment. Nasal washings and  aspirates are unacceptable for Xpert Xpress SARS-CoV-2/FLU/RSV  testing. Fact Sheet for Patients: PinkCheek.be Fact Sheet for Healthcare Providers: GravelBags.it This test is not yet approved or cleared by the [place] FDA and  has been authorized for detection and/or diagnosis of SARS-CoV-2 by  FDA under an Emergency Use Authorization (EUA). This EUA will remain  in effect (meaning this test can be used) for the duration of the  Covid-19 declaration under Section 564(b)(1) of the Act, 21  U.S.C. section 360bbb-3(b)(1), unless the authorization is  terminated or revoked. Performed at [facility] [place], [place] [address], [place], [place] [address]   Urine culture     Status: Abnormal   Collection Time: 07/16/19  3:12 PM   Specimen: In/Out Cath Urine  Result Value Ref Range Status   Specimen Description IN/OUT CATH URINE  Final   Special Requests   Final    NONE Performed at [facility], [place] [address], [place], [place] [address]    Culture >=100,000 COLONIES/mL KLEBSIELLA PNEUMONIAE (A)  Final   Report Status 07/18/2019 FINAL  Final   Organism ID, Bacteria KLEBSIELLA PNEUMONIAE (A)  Final      Susceptibility   Klebsiella pneumoniae - MIC*    AMPICILLIN RESISTANT Resistant     CEFAZOLIN <=4 SENSITIVE Sensitive     CEFTRIAXONE <=1 SENSITIVE Sensitive     CIPROFLOXACIN <=0.25 SENSITIVE Sensitive     GENTAMICIN <=1 SENSITIVE Sensitive     IMIPENEM <=0.25 SENSITIVE Sensitive     NITROFURANTOIN 64 INTERMEDIATE Intermediate     TRIMETH/SULFA <=20 SENSITIVE Sensitive     AMPICILLIN/SULBACTAM 4 SENSITIVE Sensitive     PIP/TAZO <=4 SENSITIVE Sensitive     * >=100,000 COLONIES/mL KLEBSIELLA PNEUMONIAE     Labs: Basic Metabolic Panel: Recent Labs  Lab 07/15/19 1928 07/16/19 1921 07/17/19 0412 07/18/19 0445  NA 144 137 137 140  K 4.1 4.5 4.6 4.3  CL 104 100 102 105  CO2 _0 GLUCOSE 82 115* 88 105*  BUN 21 24* 23 17  CREATININE 0.98 1.22* 1.01* 0.88  CALCIUM 7.6* 7.5* 7.6* 7.8*  MG  --   --   --  2.0   Liver Function Tests: Recent Labs  Lab 07/15/19 1928 07/18/19 0445  AST 28 19  ALT 10 9  ALKPHOS 67 50  BILITOT 0.6 0.7  PROT 6.6 5.7*  ALBUMIN 3.3* 2.5*  CBC: Recent Labs  Lab 07/15/19 1928 07/16/19 1921 07/17/19 0412 07/18/19 0445  WBC 4.7 4.0 3.6* 3.3*  NEUTROABS 4.0  --   --   --   HGB 11.3* 9.1* 9.3* 9.0*  HCT 37.7 30.4* 30.9* 30.7*  MCV 94.5 93.5 93.6 94.5  PLT 163 154 156 149*   CBG: No results for input(s): GLUCAP in the last 168 hours. Hgb A1c No results for input(s): HGBA1C in the last 72 hours. Lipid Profile No results for  input(s): CHOL, HDL, LDLCALC, TRIG, CHOLHDL, LDLDIRECT in the last 72 hours. Thyroid function studies No results for input(s): TSH, T4TOTAL, T3FREE, THYROIDAB in the last 72 hours.  Invalid input(s): FREET3 Urinalysis    Component Value Date/Time   COLORURINE YELLOW 07/16/2019 1517   APPEARANCEUR CLOUDY (A) 07/16/2019 1517   LABSPEC 1.044 (H) 07/16/2019 1517   PHURINE 5.0 07/16/2019 1517   GLUCOSEU NEGATIVE 07/16/2019 1517   GLUCOSEU NEGATIVE 07/24/2011 0908   HGBUR SMALL (A) 07/16/2019 1517   HGBUR negative 05/19/2010 1609   BILIRUBINUR NEGATIVE 07/16/2019 1517   BILIRUBINUR neg 12/09/2017 1512   KETONESUR NEGATIVE 07/16/2019 1517   PROTEINUR 30 (A) 07/16/2019 1517   UROBILINOGEN 0.2 12/09/2017 1512   UROBILINOGEN 0.2 05/17/2014 1217   NITRITE NEGATIVE 07/16/2019 1517   LEUKOCYTESUR LARGE (A) 07/16/2019 1517    FURTHER DISCHARGE INSTRUCTIONS:   Get Medicines reviewed and adjusted: Please take all your medications with you for your next visit with your Primary MD   Laboratory/radiological data: Please request your Primary MD to go over all hospital tests and procedure/radiological results at the follow up, please ask your Primary MD to get all Hospital records sent to his/her office.   In some cases, they will be blood work, cultures and biopsy results pending at the time of your discharge. Please request that your primary care M.D. goes through all the records of your hospital data and follows up on these results.   Also Note the following: If you experience worsening of your admission symptoms, develop shortness of breath, life threatening emergency, suicidal or homicidal thoughts you must seek medical attention immediately by calling 911 or calling your MD immediately  if symptoms less severe.   You must read complete instructions/literature along with all the possible adverse reactions/side effects for all the Medicines you take and that have been prescribed to you. Take  any new Medicines after you have completely understood and accpet all the possible adverse reactions/side effects.    Do not drive when taking Pain medications or sleeping medications (Benzodaizepines)   Do not take more than prescribed Pain, Sleep and Anxiety Medications. It is not advisable to combine anxiety,sleep and pain medications without talking with your primary care practitioner   Special Instructions: If you have smoked or chewed Tobacco  in the last 2 yrs please stop smoking, stop any regular Alcohol  and or any Recreational drug use.   Wear Seat belts while driving.   Please note: You were cared for by a hospitalist during your hospital stay. Once you are discharged, your primary care physician will handle any further medical issues. Please note that NO REFILLS for any discharge medications will be authorized once you are discharged, as it is imperative that you return to your primary care physician (or establish a relationship with a primary care physician if you do not have one) for your post hospital discharge needs so that they can reassess your need for medications and monitor your lab values.  Time coordinating  discharge: 40 minutes  SIGNED:  Marzetta Board, MD, PhD 07/18/2019, 11:01 AM

## 2019-07-18 NOTE — Transfer of Care (Signed)
Immediate Anesthesia Transfer of Care Note  Patient: [name]  Procedure(s) Performed: TRANSESOPHAGEAL ECHOCARDIOGRAM (TEE) (N/A )  Patient Location: Endoscopy Unit  Anesthesia Type:MAC  Level of Consciousness: drowsy and patient cooperative  Airway & Oxygen Therapy: Patient Spontanous Breathing and Patient connected to nasal cannula oxygen  Post-op Assessment: Report given to RN and Post -op Vital signs reviewed and stable  Post vital signs: Reviewed and stable  Last Vitals:  Vitals Value Taken Time  BP 121/102 07/18/19 0757  Temp 36.7 C 07/18/19 0757  Pulse 68 07/18/19 0757  Resp 18 07/18/19 0409  SpO2 98 % 07/18/19 0757    Last Pain:  Vitals:   07/18/19 0757  TempSrc: Oral  PainSc: 7       Patients Stated Pain Goal: 0 (16/01/09 3235)  Complications: No apparent anesthesia complications

## 2019-07-20 ENCOUNTER — Telehealth: Payer: Self-pay | Admitting: *Deleted

## 2019-07-20 ENCOUNTER — Other Ambulatory Visit: Payer: Self-pay | Admitting: Family Medicine

## 2019-07-20 ENCOUNTER — Telehealth: Payer: Self-pay

## 2019-07-20 ENCOUNTER — Ambulatory Visit (INDEPENDENT_AMBULATORY_CARE_PROVIDER_SITE_OTHER): Payer: HMO | Admitting: *Deleted

## 2019-07-20 ENCOUNTER — Other Ambulatory Visit: Payer: Self-pay

## 2019-07-20 ENCOUNTER — Inpatient Hospital Stay (HOSPITAL_COMMUNITY)
Admission: AD | Admit: 2019-07-20 | Discharge: 2019-07-23 | DRG: 864 | Disposition: A | Discharge status: Home / Self Care | Payer: HMO | Attending: Internal Medicine | Admitting: Internal Medicine

## 2019-07-20 ENCOUNTER — Inpatient Hospital Stay (HOSPITAL_COMMUNITY): Payer: HMO

## 2019-07-20 ENCOUNTER — Other Ambulatory Visit: Payer: Self-pay | Admitting: Internal Medicine

## 2019-07-20 DIAGNOSIS — J158 Pneumonia due to other specified bacteria: Secondary | ICD-10-CM | POA: Diagnosis not present

## 2019-07-20 DIAGNOSIS — I10 Essential (primary) hypertension: Secondary | ICD-10-CM

## 2019-07-20 DIAGNOSIS — N390 Urinary tract infection, site not specified: Secondary | ICD-10-CM | POA: Diagnosis not present

## 2019-07-20 DIAGNOSIS — D708 Other neutropenia: Secondary | ICD-10-CM | POA: Diagnosis present

## 2019-07-20 DIAGNOSIS — J440 Chronic obstructive pulmonary disease with acute lower respiratory infection: Secondary | ICD-10-CM | POA: Diagnosis present

## 2019-07-20 DIAGNOSIS — Z8679 Personal history of other diseases of the circulatory system: Secondary | ICD-10-CM | POA: Diagnosis not present

## 2019-07-20 DIAGNOSIS — R5082 Postprocedural fever: Secondary | ICD-10-CM | POA: Diagnosis present

## 2019-07-20 DIAGNOSIS — K219 Gastro-esophageal reflux disease without esophagitis: Secondary | ICD-10-CM | POA: Diagnosis present

## 2019-07-20 DIAGNOSIS — M797 Fibromyalgia: Secondary | ICD-10-CM | POA: Diagnosis not present

## 2019-07-20 DIAGNOSIS — I447 Left bundle-branch block, unspecified: Secondary | ICD-10-CM | POA: Diagnosis present

## 2019-07-20 DIAGNOSIS — T827XXA Infection and inflammatory reaction due to other cardiac and vascular devices, implants and grafts, initial encounter: Secondary | ICD-10-CM | POA: Diagnosis not present

## 2019-07-20 DIAGNOSIS — I420 Dilated cardiomyopathy: Secondary | ICD-10-CM | POA: Diagnosis not present

## 2019-07-20 DIAGNOSIS — I5042 Chronic combined systolic (congestive) and diastolic (congestive) heart failure: Secondary | ICD-10-CM | POA: Diagnosis not present

## 2019-07-20 DIAGNOSIS — G2581 Restless legs syndrome: Secondary | ICD-10-CM | POA: Diagnosis not present

## 2019-07-20 DIAGNOSIS — Z8701 Personal history of pneumonia (recurrent): Secondary | ICD-10-CM

## 2019-07-20 DIAGNOSIS — I428 Other cardiomyopathies: Secondary | ICD-10-CM

## 2019-07-20 DIAGNOSIS — Z79899 Other long term (current) drug therapy: Secondary | ICD-10-CM

## 2019-07-20 DIAGNOSIS — Z87891 Personal history of nicotine dependence: Secondary | ICD-10-CM

## 2019-07-20 DIAGNOSIS — J9611 Chronic respiratory failure with hypoxia: Secondary | ICD-10-CM | POA: Diagnosis not present

## 2019-07-20 DIAGNOSIS — D61818 Other pancytopenia: Secondary | ICD-10-CM | POA: Diagnosis not present

## 2019-07-20 DIAGNOSIS — I480 Paroxysmal atrial fibrillation: Secondary | ICD-10-CM | POA: Diagnosis not present

## 2019-07-20 DIAGNOSIS — I2510 Atherosclerotic heart disease of native coronary artery without angina pectoris: Secondary | ICD-10-CM | POA: Diagnosis not present

## 2019-07-20 DIAGNOSIS — Z8614 Personal history of Methicillin resistant Staphylococcus aureus infection: Secondary | ICD-10-CM | POA: Diagnosis not present

## 2019-07-20 DIAGNOSIS — Z79891 Long term (current) use of opiate analgesic: Secondary | ICD-10-CM

## 2019-07-20 DIAGNOSIS — I110 Hypertensive heart disease with heart failure: Secondary | ICD-10-CM | POA: Diagnosis not present

## 2019-07-20 DIAGNOSIS — Z96641 Presence of right artificial hip joint: Secondary | ICD-10-CM | POA: Diagnosis present

## 2019-07-20 DIAGNOSIS — Z8249 Family history of ischemic heart disease and other diseases of the circulatory system: Secondary | ICD-10-CM

## 2019-07-20 DIAGNOSIS — Z20822 Contact with and (suspected) exposure to covid-19: Secondary | ICD-10-CM | POA: Diagnosis not present

## 2019-07-20 DIAGNOSIS — G629 Polyneuropathy, unspecified: Secondary | ICD-10-CM | POA: Diagnosis not present

## 2019-07-20 DIAGNOSIS — Z950 Presence of cardiac pacemaker: Secondary | ICD-10-CM | POA: Diagnosis not present

## 2019-07-20 DIAGNOSIS — R509 Fever, unspecified: Principal | ICD-10-CM | POA: Diagnosis present

## 2019-07-20 DIAGNOSIS — R52 Pain, unspecified: Secondary | ICD-10-CM

## 2019-07-20 DIAGNOSIS — Z9981 Dependence on supplemental oxygen: Secondary | ICD-10-CM

## 2019-07-20 DIAGNOSIS — Z8673 Personal history of transient ischemic attack (TIA), and cerebral infarction without residual deficits: Secondary | ICD-10-CM

## 2019-07-20 DIAGNOSIS — Z888 Allergy status to other drugs, medicaments and biological substances status: Secondary | ICD-10-CM

## 2019-07-20 DIAGNOSIS — I429 Cardiomyopathy, unspecified: Secondary | ICD-10-CM

## 2019-07-20 DIAGNOSIS — Z8711 Personal history of peptic ulcer disease: Secondary | ICD-10-CM

## 2019-07-20 DIAGNOSIS — A310 Pulmonary mycobacterial infection: Secondary | ICD-10-CM | POA: Diagnosis not present

## 2019-07-20 DIAGNOSIS — Z881 Allergy status to other antibiotic agents status: Secondary | ICD-10-CM

## 2019-07-20 DIAGNOSIS — T82837D Hemorrhage of cardiac prosthetic devices, implants and grafts, subsequent encounter: Secondary | ICD-10-CM | POA: Diagnosis not present

## 2019-07-20 DIAGNOSIS — Z7901 Long term (current) use of anticoagulants: Secondary | ICD-10-CM

## 2019-07-20 DIAGNOSIS — J439 Emphysema, unspecified: Secondary | ICD-10-CM | POA: Diagnosis present

## 2019-07-20 HISTORY — DX: Personal history of pneumonia (recurrent): Z87.01

## 2019-07-20 HISTORY — DX: Essential (primary) hypertension: I10

## 2019-07-20 LAB — COMPREHENSIVE METABOLIC PANEL
ALT: 14 U/L (ref 0–44)
AST: 22 U/L (ref 15–41)
Albumin: 2.8 g/dL — ABNORMAL LOW (ref 3.5–5.0)
Alkaline Phosphatase: 60 U/L (ref 38–126)
Anion gap: 6 (ref 5–15)
BUN: 16 mg/dL (ref 8–23)
CO2: 26 mmol/L (ref 22–32)
Calcium: 7.7 mg/dL — ABNORMAL LOW (ref 8.9–10.3)
Chloride: 112 mmol/L — ABNORMAL HIGH (ref 98–111)
Creatinine, Ser: 0.95 mg/dL (ref 0.44–1.00)
GFR calc Af Amer: >60 mL/min (ref >60)
GFR calc non Af Amer: 59 mL/min — ABNORMAL LOW (ref >60)
Glucose, Bld: 98 mg/dL (ref 70–99)
Potassium: 5.1 mmol/L (ref 3.5–5.1)
Sodium: 144 mmol/L (ref 135–145)
Total Bilirubin: 0.5 mg/dL (ref 0.3–1.2)
Total Protein: 6.1 g/dL — ABNORMAL LOW (ref 6.5–8.1)

## 2019-07-20 LAB — TYPE AND SCREEN
ABO/RH(D): A NEG
Antibody Screen: NEGATIVE

## 2019-07-20 LAB — CBC WITH DIFFERENTIAL/PLATELET
Abs Immature Granulocytes: 0.02 10*3/uL (ref 0.00–0.07)
Basophils Absolute: 0 10*3/uL (ref 0.0–0.1)
Basophils Relative: 0 %
Eosinophils Absolute: 0.1 10*3/uL (ref 0.0–0.5)
Eosinophils Relative: 3 %
HCT: 34.1 % — ABNORMAL LOW (ref 36.0–46.0)
Hemoglobin: 10 g/dL — ABNORMAL LOW (ref 12.0–15.0)
Immature Granulocytes: 1 %
Lymphocytes Relative: 15 %
Lymphs Abs: 0.4 10*3/uL — ABNORMAL LOW (ref 0.7–4.0)
MCH: 27.7 pg (ref 26.0–34.0)
MCHC: 29.3 g/dL — ABNORMAL LOW (ref 30.0–36.0)
MCV: 94.5 fL (ref 80.0–100.0)
Monocytes Absolute: 0.4 10*3/uL (ref 0.1–1.0)
Monocytes Relative: 14 %
Neutro Abs: 1.9 10*3/uL (ref 1.7–7.7)
Neutrophils Relative %: 67 %
Platelets: 232 10*3/uL (ref 150–400)
RBC: 3.61 MIL/uL — ABNORMAL LOW (ref 3.87–5.11)
RDW: 15 % (ref 11.5–15.5)
WBC: 2.8 10*3/uL — ABNORMAL LOW (ref 4.0–10.5)
nRBC: 0 % (ref 0.0–0.2)

## 2019-07-20 LAB — RETICULOCYTES
Immature Retic Fract: 6.6 % (ref 2.3–15.9)
RBC.: 3.57 MIL/uL — ABNORMAL LOW (ref 3.87–5.11)
Retic Count, Absolute: 34.5 10*3/uL (ref 19.0–186.0)
Retic Ct Pct: 1 % (ref 0.4–3.1)

## 2019-07-20 LAB — IRON AND TIBC
Iron: 21 ug/dL — ABNORMAL LOW (ref 28–170)
Saturation Ratios: 12 % (ref 10.4–31.8)
TIBC: 181 ug/dL — ABNORMAL LOW (ref 250–450)
UIBC: 160 ug/dL

## 2019-07-20 LAB — CULTURE, BLOOD (ROUTINE X 2)
Culture: NO GROWTH
Culture: NO GROWTH
Special Requests: ADEQUATE
Special Requests: ADEQUATE

## 2019-07-20 LAB — VITAMIN B12: Vitamin B-12: 128 pg/mL — ABNORMAL LOW (ref 180–914)

## 2019-07-20 LAB — C-REACTIVE PROTEIN: CRP: 2.2 mg/dL — ABNORMAL HIGH (ref <1.0)

## 2019-07-20 LAB — LACTATE DEHYDROGENASE: LDH: 279 U/L — ABNORMAL HIGH (ref 98–192)

## 2019-07-20 LAB — FOLATE: Folate: 9.2 ng/mL (ref >5.9)

## 2019-07-20 LAB — TSH: TSH: 7.708 u[IU]/mL — ABNORMAL HIGH (ref 0.350–4.500)

## 2019-07-20 LAB — FERRITIN: Ferritin: 1432 ng/mL — ABNORMAL HIGH (ref 11–307)

## 2019-07-20 LAB — SEDIMENTATION RATE: Sed Rate: 49 mm/hr — ABNORMAL HIGH (ref 0–22)

## 2019-07-20 MED ORDER — ZOLPIDEM TARTRATE 5 MG PO TABS
5.0000 mg | ORAL_TABLET | Freq: Every day | ORAL | Status: DC
  Administered 2019-07-20 – 2019-07-22 (×3): 5 mg via ORAL
  Filled 2019-07-20 (×3): qty 1

## 2019-07-20 MED ORDER — CARVEDILOL 25 MG PO TABS
25.0000 mg | ORAL_TABLET | Freq: Every day | ORAL | Status: DC
  Administered 2019-07-20 – 2019-07-22 (×3): 25 mg via ORAL
  Filled 2019-07-20 (×3): qty 1

## 2019-07-20 MED ORDER — ETHAMBUTOL HCL 100 MG PO TABS: 200.0000 mg | ORAL_TABLET | ORAL | Status: DC

## 2019-07-20 MED ORDER — PANTOPRAZOLE SODIUM 40 MG PO TBEC
40.0000 mg | DELAYED_RELEASE_TABLET | Freq: Two times a day (BID) | ORAL | Status: DC
  Administered 2019-07-20 – 2019-07-23 (×6): 40 mg via ORAL
  Filled 2019-07-20 (×6): qty 1

## 2019-07-20 MED ORDER — ATORVASTATIN CALCIUM 10 MG PO TABS
20.0000 mg | ORAL_TABLET | Freq: Every evening | ORAL | Status: DC
  Administered 2019-07-20 – 2019-07-22 (×3): 20 mg via ORAL
  Filled 2019-07-20 (×3): qty 2

## 2019-07-20 MED ORDER — ALBUTEROL SULFATE (2.5 MG/3ML) 0.083% IN NEBU: 2.5000 mg | INHALATION_SOLUTION | Freq: Four times a day (QID) | RESPIRATORY_TRACT | Status: DC | PRN

## 2019-07-20 MED ORDER — OXYCODONE-ACETAMINOPHEN 10-325 MG PO TABS: 1.0000 | ORAL_TABLET | ORAL | Status: DC

## 2019-07-20 MED ORDER — GABAPENTIN 600 MG PO TABS
600.0000 mg | ORAL_TABLET | Freq: Four times a day (QID) | ORAL | Status: DC
  Administered 2019-07-20 – 2019-07-23 (×10): 600 mg via ORAL
  Filled 2019-07-20 (×10): qty 1

## 2019-07-20 MED ORDER — AZITHROMYCIN 250 MG PO TABS
500.0000 mg | ORAL_TABLET | ORAL | Status: DC
  Administered 2019-07-21: 500 mg via ORAL
  Filled 2019-07-20: qty 2

## 2019-07-20 MED ORDER — TIZANIDINE HCL 4 MG PO TABS
4.0000 mg | ORAL_TABLET | Freq: Three times a day (TID) | ORAL | Status: DC | PRN
  Administered 2019-07-21: 4 mg via ORAL
  Filled 2019-07-20 (×2): qty 1

## 2019-07-20 MED ORDER — FUROSEMIDE 40 MG PO TABS
40.0000 mg | ORAL_TABLET | Freq: Every morning | ORAL | Status: DC
  Administered 2019-07-21 – 2019-07-23 (×3): 40 mg via ORAL
  Filled 2019-07-20 (×3): qty 1

## 2019-07-20 MED ORDER — OXYCODONE HCL 5 MG PO TABS
5.0000 mg | ORAL_TABLET | Freq: Four times a day (QID) | ORAL | Status: DC | PRN
  Administered 2019-07-20 – 2019-07-21 (×2): 5 mg via ORAL
  Filled 2019-07-20 (×2): qty 1

## 2019-07-20 MED ORDER — ETHAMBUTOL HCL 400 MG PO TABS: 800.0000 mg | ORAL_TABLET | ORAL | Status: DC

## 2019-07-20 MED ORDER — OXYCODONE-ACETAMINOPHEN 5-325 MG PO TABS
1.0000 | ORAL_TABLET | Freq: Four times a day (QID) | ORAL | Status: DC | PRN
  Administered 2019-07-20 – 2019-07-21 (×2): 1 via ORAL
  Filled 2019-07-20 (×2): qty 1

## 2019-07-20 MED ORDER — UMECLIDINIUM BROMIDE 62.5 MCG/INH IN AEPB
1.0000 | INHALATION_SPRAY | Freq: Every day | RESPIRATORY_TRACT | Status: DC
  Administered 2019-07-22 – 2019-07-23 (×2): 1 via RESPIRATORY_TRACT
  Filled 2019-07-20: qty 7

## 2019-07-20 MED ORDER — ONDANSETRON HCL 4 MG PO TABS: 4.0000 mg | ORAL_TABLET | Freq: Four times a day (QID) | ORAL | Status: DC | PRN

## 2019-07-20 MED ORDER — ETHAMBUTOL HCL 400 MG PO TABS: 1000.0000 mg | ORAL_TABLET | ORAL | Status: DC

## 2019-07-20 MED ORDER — CEPHALEXIN 500 MG PO CAPS: 500.0000 mg | ORAL_CAPSULE | Freq: Four times a day (QID) | ORAL | Status: DC

## 2019-07-20 MED ORDER — AMLODIPINE BESYLATE 2.5 MG PO TABS
2.5000 mg | ORAL_TABLET | Freq: Every day | ORAL | Status: DC
  Administered 2019-07-20 – 2019-07-22 (×3): 2.5 mg via ORAL
  Filled 2019-07-20 (×3): qty 1

## 2019-07-20 MED ORDER — RIFABUTIN 150 MG PO CAPS
300.0000 mg | ORAL_CAPSULE | ORAL | Status: DC
  Administered 2019-07-22: 300 mg via ORAL
  Filled 2019-07-20: qty 2

## 2019-07-20 MED ORDER — NITROGLYCERIN 0.4 MG SL SUBL: 0.4000 mg | SUBLINGUAL_TABLET | SUBLINGUAL | Status: DC | PRN

## 2019-07-20 MED ORDER — ALBUTEROL SULFATE HFA 108 (90 BASE) MCG/ACT IN AERS: 2.0000 | INHALATION_SPRAY | Freq: Four times a day (QID) | RESPIRATORY_TRACT | Status: DC | PRN

## 2019-07-20 MED ORDER — CEPHALEXIN 500 MG PO CAPS
500.0000 mg | ORAL_CAPSULE | Freq: Four times a day (QID) | ORAL | Status: AC
  Administered 2019-07-20 – 2019-07-22 (×9): 500 mg via ORAL
  Filled 2019-07-20 (×9): qty 1

## 2019-07-20 MED ORDER — ONDANSETRON HCL 4 MG/2ML IJ SOLN: 4.0000 mg | Freq: Four times a day (QID) | INTRAMUSCULAR | Status: DC | PRN

## 2019-07-20 NOTE — Progress Notes (Signed)
Wound recheck today. Hematoma slightly smaller than last visit, skin blue in color at hematoma site. Incision without drainage, redness or bleeding. Eliquis on hold due to hematoma. Patient was admitted 07/15/19 due to fever of 103 F . Patient was diagnosed with UTI and placed on Keflex 500 mg TID at discharge 07/18/19. Reports fever of 103 F 07/19/19. Patient reports generalized Malaise. CBC 07/18/19 showed HGB 9.0, Hct 30.7, and PLT 149. Dr Caryl Comes I to assess patient. Will consult with Dr Tommy Medal to determine treatment plan. Patient will receive call with instructions after that call. Follow up wound check in 10 days.

## 2019-07-20 NOTE — Patient Outreach (Signed)
  [facility]) Care Management Chronic Special Needs Program  07/20/2019  Name: [name] DOB: 1945-07-18  MRN: [id number]  [name] is enrolled in a chronic special needs plan for Heart Failure. Client admitted to [facility] on 07/16/19 with dx of fever and MAI(mycobacterium avium-intracellulare infection.  Discharged home on 07/18/19 Updated care plan.   Goals Addressed            This Visit's Progress   . General - Client will not be readmitted within 30 days (C-SNP)       Please follow discharge instructions and call provider if you have any questions. Take antibiotics as directed  Call provider for fever  Please attend all follow up appointments as scheduled. Please take your medications as prescribed. Please call 24 Hour nurse advice line as needed [phone]).  Continue to work with [facility] for complex management and call as needed Transitions of care call completed by EMMI general discharge  Communicated with [facility] of clients discharge and [facility] will complete discharge follow up call     Communicated to [facility] team of clients discharge and [facility] will complete post hospitalization call/visit  Plan:  Send  outreach letter with a copy of their individualized care plan and Send individual care plan to provider Plan to follow up on any EMMI red flags Chronic care management coordinator will review [facility]'s plan of care and will collaborate with [facility] team as indicated    Chronic care management coordinator will outreach in:  One month     [place], [name], [place] Management Coordinator [place] [facility] [phone]

## 2019-07-20 NOTE — Telephone Encounter (Signed)
Spoke with pt and advised per Dr Caryl Comes and infectious disease pt needs to be a direct admission for temp of 103 and possible infection.  She will need to go to ED and inform them she is a direct admission for Dr Caryl Comes.  Pt is agreeable and verbalizes understanding.  Pt states she will leave for the hospital now.

## 2019-07-20 NOTE — Consult Note (Signed)
Cardiology Consultation:   Patient ID: [name]; [id number]; 04-10-1945   Admit date: 07/20/2019 Date of Consult: 07/20/2019  Primary Care Provider: Carollee Herter, Alferd Apa, DO Primary Cardiologist: Minus Breeding, MD Primary Electrophysiologist: Virl Axe, MD   Patient Profile:   [name] is a 74 y.o. female with a history of PAF with previous TIA, mild nonobstructive CAD as of 2011, COPD with chronic hypoxic respiratory failure on oxygen, recurrent pneumonia with MAI, iron deficiency anemia, chronic systolic heart failure with nonischemic cardiomyopathy, left bundle branch block status post CRT P, who is being seen today for the evaluation of recurrent fevers.  History of Present Illness:   [name] was recently discharged from the hospital on May 1 after admission with recurrent fevers in the face of ongoing antibiotic therapy for MAI pneumonia.  Blood cultures were negative during hospital stay, she was also noted to have a pacer pocket hematoma following device replacement on April 2 and was taken off Eliquis.  She did undergo a TEE that did not demonstrate any valvular vegetations or other signs of infection.  She was seen by the infectious disease team and discharged on Keflex.  She is now referred back to the hospital by Dr. Caryl Comes with continued fevers, reportedly temperature to 103 degrees yesterday.  She has been treated for concurrent UTI, does not report any abdominal pain or worsening dysuria.  No progressive cough or hemoptysis.  Pacer pocket is tender but has not been enlarging and there has been no drainage at the site.  Per discussion with [name] who spoke with Dr. Caryl Comes by phone this evening, the patient is to be admitted to the hospitalist service and have consultation with the infectious disease team.  Dr. Caryl Comes has reportedly already spoken with the hospitalist service and also with Dr. Tommy Medal.  Past Medical History:  Diagnosis Date  . Anemia   .  Arthritis   . Atrial fibrillation ([place])   . CAD (coronary artery disease)    Mild disease per cath in 2011  . COPD (chronic obstructive pulmonary disease) ([place])    Home O2  . Diverticulitis   . Elbow fracture, left aug 2012  . Erythropoietin deficiency anemia 06/19/2018  . Essential hypertension   . Fibromyalgia   . Gastroparesis   . GERD (gastroesophageal reflux disease)   . H/O: GI bleed    from Pradaxa  . Hepatitis    Hx of in high school   . History of pneumonia    Recurrent  . Hx MRSA infection   . Hx of gastric ulcer   . Hx of stress fx 10/2009   Right hip   . Iron malabsorption 08/14/2017  . LBBB (left bundle branch block)   . Oxygen dependent    2 liters via nasal cannula at all times  . Pacemaker    CRT therapy; followed by Dr. Caryl Comes  . Pleural effusion     s/p right thoracentesis 03/09  . Primary dilated cardiomyopathy (HCC)    EF 45 to 50% per echo in Jan 2012  . RLS (restless legs syndrome)   . Silent aspiration   . Small bowel obstruction ([place])   . Urinary incontinence   . Venous embolism and thrombosis of subclavian vein [facility])    After pacemaker insertion Oct 2011  . Vitamin B12 deficiency   . Wears glasses    Reading    Past Surgical History:  Procedure Laterality Date  . APPENDECTOMY    .  BIV PACEMAKER GENERATOR CHANGEOUT N/A 06/19/2019   Procedure: BIV PACEMAKER GENERATOR CHANGEOUT;  Surgeon: Deboraha Sprang, MD;  Location: [facility];  Service: Cardiovascular;  Laterality: N/A;  . BRONCHIAL WASHINGS  05/25/2019   Procedure: BRONCHIAL WASHINGS;  Surgeon: Chesley Mires, MD;  Location: [facility];  Service: Endoscopy;;  . CARDIAC CATHETERIZATION  04/08/2006, 11/16/2009  . CHOLECYSTECTOMY N/A 06/27/2017   Procedure: OPEN CHOLECYSTECTOMY;  Surgeon: Ralene Ok, MD;  Location: [place];  Service: General;  Laterality: N/A;  . COLECTOMY     Intestinal resection (5times)  . COLECTOMY  02/04/2000   ex. lap., intra-abd. subtotal colectomy with  ileosigmoid colon anastomosies and lysis of adhesions  . CYSTOSCOPY  11/11/2008  . CYSTOSCOPY W/ RETROGRADES  11/11/2008   right  . CYSTOSCOPY WITH INJECTION  04/30/2006   transurethral collagen injection; incision vaginal stenosis  . ERCP N/A 06/23/2017   Procedure: ENDOSCOPIC RETROGRADE CHOLANGIOPANCREATOGRAPHY (ERCP);  Surgeon: Carol Ada, MD;  Location: [place];  Service: Endoscopy;  Laterality: N/A;  . ERCP N/A 06/24/2017   Procedure: ENDOSCOPIC RETROGRADE CHOLANGIOPANCREATOGRAPHY (ERCP);  Surgeon: [name], MD;  Location: [facility] END[facility];  Service: Endoscopy;  Laterality: N/A;  . ESOPHAGOGASTRODUODENOSCOPY N/A 02/02/2013   Procedure: ESOPHAGOGASTRODUODENOSCOPY (EGD);  Surgeon: Lafayette Dragon, MD;  Location: [facility];  Service: Endoscopy;  Laterality: N/A;  . EXPLORATORY LAPAROTOMY  04/27/2009   lysis of adhesions, gastrostomy tube  . GASTROCUTANEOUS FISTULA CLOSURE    . INTERSTIM IMPLANT PLACEMENT  05/28/2006 - stage I   06/05/2006 - stage II  . INTERSTIM IMPLANT PLACEMENT  02/05/2012   Procedure: [name] IMPLANT FIRST STAGE;  Surgeon: Reece Packer, MD;  Location: [facility];  Service: Urology;  Laterality: N/A;  Replacement of Interstim Lead     . INTERSTIM IMPLANT REVISION  03/06/2011   Procedure: REVISION OF [name];  Surgeon: Reece Packer, MD;  Location: [facility];  Service: Urology;  Laterality: N/A;  Replacement of Neurostimulator  . INTERSTIM IMPLANT REVISION  10/23/2007  . IR IMAGING GUIDED PORT INSERTION  10/14/2017  . PACEMAKER PLACEMENT  12/21/2009  . Peg removed     with complications  . PORT-A-CATH REMOVAL  07/27/2011   Procedure: REMOVAL PORT-A-CATH;  Surgeon: Rolm Bookbinder, MD;  Location: [place];  Service: General;  Laterality: N/A;  . PORTACATH PLACEMENT  12/16/2009  . SAVORY DILATION N/A 02/02/2013   Procedure: SAVORY DILATION;  Surgeon: Lafayette Dragon, MD;  [facility];  Service: Endoscopy;  Laterality: N/A;  . SMALL  INTESTINE SURGERY  05/20/2001   ex. lap., resection of small bowel stricture; gastrostomy; insertion central line  . TEE WITHOUT CARDIOVERSION N/A 07/17/2019   Procedure: TRANSESOPHAGEAL ECHOCARDIOGRAM (TEE);  Surgeon: Donato Heinz, MD;  Location: [facility] ENDOSCOPY;  Service: Cardiovascular;  Laterality: N/A;  . TONSILLECTOMY  age 86  . TOTAL ABDOMINAL HYSTERECTOMY     complete  . TOTAL HIP ARTHROPLASTY  08/03/2011   Procedure: TOTAL HIP ARTHROPLASTY ANTERIOR APPROACH;  Surgeon: Mcarthur Rossetti, MD;  Location: [facility];  Service: Orthopedics;  Laterality: Right;  [name] VIDEO BRONCHOSCOPY N/A 05/25/2019   Procedure: VIDEO BRONCHOSCOPY WITHOUT FLUORO;  Surgeon: Chesley Mires, MD;  [facility];  Service: Endoscopy;  Laterality: N/A;     Outpatient Medications: No current facility-administered medications on file prior to encounter.   Current Outpatient Medications on File Prior to Encounter  Medication Sig Dispense Refill  . acetaminophen (TYLENOL) 650 MG CR tablet Take 650 mg by mouth every 8 (eight) hours as  needed for pain.    [name] albuterol (PROVENTIL HFA) 108 (90 Base) MCG/ACT inhaler Inhale 2 puffs into the lungs every 6 (six) hours as needed for wheezing or shortness of breath.    [name] amLODipine (NORVASC) 2.5 MG tablet Take 1 tablet (2.5 mg total) by mouth daily. (Patient taking differently: Take 2.5 mg by mouth at bedtime. ) 90 tablet 3  . atorvastatin (LIPITOR) 20 MG tablet TAKE 1 TABLET BY MOUTH ONCE DAILY AT  6PM (Patient taking differently: Take 20 mg by mouth every evening. ) 90 tablet 1  . azithromycin (ZITHROMAX) 500 MG tablet Take 1 tablet (500 mg total) by mouth 3 (three) times a week. (Patient taking differently: Take 500 mg by mouth See admin instructions. Take 500 mg by mouth on Tues/Thurs/Sat) 12 tablet 8  . carvedilol (COREG) 25 MG tablet Take 1 tablet (25 mg total) by mouth 2 (two) times daily. (Patient taking differently: Take 25 mg by mouth at bedtime. ) 180 tablet 0   . cephALEXin (KEFLEX) 500 MG capsule Take 1 capsule (500 mg total) by mouth 4 (four) times daily for 10 days. 40 capsule 0  . denosumab (PROLIA) 60 MG/ML SOSY injection Inject 60 mg into the skin every 6 (six) months.    . ethambutol (MYAMBUTOL) 100 MG tablet Take 2 tablets (200 mg total) by mouth 3 (three) times a week. Take with 2 tablets of 400 mg of ethambutol to equal 1000 mg total. (Patient taking differently: Take 200 mg by mouth every Monday, Wednesday, and Friday. ) 24 tablet 8  . ethambutol (MYAMBUTOL) 400 MG tablet Take 2 tablets (800 mg total) by mouth 3 (three) times a week. Take with 2 tablets of 100 mg of ethambutol to equal 1000 mg total. (Patient taking differently: Take 800 mg by mouth every Monday, Wednesday, and Friday. ) 24 tablet 8  . furosemide (LASIX) 40 MG tablet TAKE 1 TABLET BY MOUTH IN THE MORNING (Patient taking differently: Take 40 mg by mouth in the morning. ) 90 tablet 2  . gabapentin (NEURONTIN) 600 MG tablet Take 1 tablet by mouth 4 times daily (Patient taking differently: Take 600 mg by mouth 4 (four) times daily. ) 120 tablet 1  . lidocaine-prilocaine (EMLA) cream Apply 1 application topically as needed. (Patient taking differently: Apply 1 application topically as needed (for port access- to numb). ) 30 g 6  . nitrofurantoin, macrocrystal-monohydrate, (MACROBID) 100 MG capsule Take 100 mg by mouth in the morning.    . nitroGLYCERIN (NITROSTAT) 0.4 MG SL tablet Place 1 tablet (0.4 mg total) under the tongue every 5 (five) minutes as needed. CHEST PAIN (Patient taking differently: Place 0.4 mg under the tongue every 5 (five) minutes as needed for chest pain. ) 25 tablet 6  . oxyCODONE-acetaminophen (PERCOCET) 10-325 MG tablet Take 1 tablet by mouth every 4 (four) hours as needed. PAIN (Patient taking differently: Take 1 tablet by mouth See admin instructions. Take 1 tablet by mouth 4 times a day and an additional 1 tablet twice a day as needed for pain) 120 tablet 0  .  OXYGEN Inhale 3 L/min into the lungs continuous.     . pantoprazole (PROTONIX) 40 MG tablet Take 1 tablet by mouth twice daily (Patient taking differently: Take 40 mg by mouth 2 (two) times daily. ) 180 tablet 0  . promethazine (PHENERGAN) 25 MG tablet Take 25 mg by mouth every 6 (six) hours as needed for nausea or vomiting.    . rifabutin (MYCOBUTIN) 150  MG capsule Take 2 capsules (300 mg total) by mouth 3 (three) times a week. (Patient taking differently: Take 300 mg by mouth every Monday, Wednesday, and Friday. ) 24 capsule 8  . tiZANidine (ZANAFLEX) 4 MG tablet Take 1 tablet (4 mg total) by mouth 3 (three) times daily as needed. for muscle spams (Patient taking differently: Take 4 mg by mouth 3 (three) times daily as needed for muscle spasms. ) 90 tablet 1  . triamcinolone ointment (KENALOG) 0.1 % Apply 1 application topically daily as needed (to affected areas- for psoriasis).     [name] umeclidinium bromide (INCRUSE ELLIPTA) 62.5 MCG/INH AEPB Inhale 1 puff into the lungs daily. 1 each 12  . zolpidem (AMBIEN) 5 MG tablet TAKE 1 TABLET BY MOUTH EVERY DAY AT BEDTIME AS NEEDED (Patient taking differently: Take 5 mg by mouth at bedtime. ) 30 tablet 0  . apixaban (ELIQUIS) 5 MG TABS tablet Take 5 mg by mouth 2 (two) times daily.    . [DISCONTINUED] iron dextran complex (INFED) 50 MG/ML injection Please give infed infusion (no test dose needed) over 4 hours per pharmacy calculated dose. Ht: 5'2 Wt: 99 pounds Hgb: 12 1 mL 0     Allergies:    Allergies  Allergen Reactions  . Dabigatran Etexilate Mesylate Other (See Comments)    INTERNAL BLEEDING- Pradaxa  . Augmentin [Amoxicillin-Pot Clavulanate] Diarrhea and Nausea Only    Did it involve swelling of the face/tongue/throat, SOB, or low BP? No Did it involve sudden or severe rash/hives, skin peeling, or any reaction on the inside of your mouth or nose? No Did you need to seek medical attention at a hospital or doctor's office? No When did it last  happen?02/2019 If all above answers are "NO", may proceed with cephalosporin use.   [name] [Pentazocine] Other (See Comments)    Hallucinations     Social History:   Social History   Tobacco Use  . Smoking status: Former Smoker    Packs/day: 1.00    Years: 20.00    Pack years: 20.00    Quit date: 03/19/1986    Years since quitting: 33.3  . Smokeless tobacco: Never Used  Substance Use Topics  . Alcohol use: No    Alcohol/week: 0.0 standard drinks  . Drug use: No    Family History:   The patient's family history includes Breast cancer in her maternal aunt; Diabetes in her paternal grandfather; Heart disease in her brother, father, mother, and paternal aunt; Stroke in her mother. There is no history of Colon cancer.  ROS:  No palpitations or chest pain.  No syncope.  No orthopnea or PND.  Physical Exam/Data:   Vitals:   07/20/19 1845  BP: (!) 168/75  Pulse: 79  Resp: 18  Temp: 98 F (36.7 C)  TempSrc: Oral  SpO2: 99%   No intake or output data in the 24 hours ending 07/20/19 1942 There were no vitals filed for this visit. There is no height or weight on file to calculate BMI.   Gen: Elderly woman in no distress. HEENT: Conjunctiva and lids normal, wearing a mask. Neck: Supple, no elevated JVP or carotid bruits, no thyromegaly. Lungs: Coarse breath sounds without wheezing, nonlabored breathing at rest. Thorax: Hematoma present at right upper chest pacer pocket site with fluctuance, no erythema or drainage.  Mildly tender to palpation. Cardiac: Regular rate and rhythm, no S3 or significant systolic murmur, no pericardial rub. Abdomen: Soft, nontender, bowel sounds present. Extremities: No pitting edema,  distal pulses 2+. Skin: Warm and dry. Musculoskeletal: No kyphosis. Neuropsychiatric: Alert and oriented x3, affect grossly appropriate.  EKG:  An ECG dated 07/20/2019 was personally reviewed today and demonstrated:  Ventricular paced rhythm with atrial  sensing.  Relevant CV Studies:  TEE 07/17/2019: 1. Left ventricular ejection fraction, by estimation, is 60 to 65%. The  left ventricle has normal function. The left ventricle has no regional  wall motion abnormalities. There is mild left ventricular hypertrophy.  2. Right ventricular systolic function is normal. The right ventricular  size is normal.  3. Left atrial size was mildly dilated. No left atrial/left atrial  appendage thrombus was detected.  4. The mitral valve is normal in structure. Mild mitral valve  regurgitation.  5. The aortic valve is tricuspid. Aortic valve regurgitation is not  visualized.  6. No vegetation seen   Laboratory Data:  Chemistry Recent Labs  Lab 07/16/19 1921 07/17/19 0412 07/18/19 0445  NA 137 137 140  K 4.5 4.6 4.3  CL 100 102 105  CO2 29 28 27   GLUCOSE 115* 88 105*  BUN 24* 23 17  CREATININE 1.22* 1.01* 0.88  CALCIUM 7.5* 7.6* 7.8*  GFRNONAA 44* 55* >60  GFRAA 51* >60 >60  ANIONGAP 8 7 8     Recent Labs  Lab 07/15/19 1928 07/18/19 0445  PROT 6.6 5.7*  ALBUMIN 3.3* 2.5*  AST 28 19  ALT 10 9  ALKPHOS 67 50  BILITOT 0.6 0.7   Hematology Recent Labs  Lab 07/16/19 1921 07/17/19 0412 07/18/19 0445  WBC 4.0 3.6* 3.3*  RBC 3.25* 3.30* 3.25*  HGB 9.1* 9.3* 9.0*  HCT 30.4* 30.9* 30.7*  MCV 93.5 93.6 94.5  MCH 28.0 28.2 27.7  MCHC 29.9* 30.1 29.3*  RDW 15.5 15.5 15.3  PLT 154 156 149*   Cardiac Enzymes Recent Labs  Lab 07/15/19 1928 07/15/19 2256  TROPONINIHS 21* 19*   Radiology/Studies:   Chest CTA 07/16/2019: IMPRESSION: 1. No evidence of acute pulmonary embolism. 2. Stable moderate to marked severity areas of diffuse bilateral peribronchovascular nodularity and nodular consolidation. 3. Aortic atherosclerosis.  Assessment and Plan:   1.  Recurrent fevers as outlined above.  Patient recently treated for UTI and also MAI pneumonia.  There is also question of possible pacemaker pocket infection.  Recent TEE  did not show any valvular vegetations.  She has recently been on rifabutin, Macrobid, azithromycin and Keflex.  2.  Nonischemic cardiomyopathy with left bundle branch block status post CRT-P, recent [employer] generator change on April 2.  LVEF in the range of 60 to 65% most recently.  She is on Coreg, Lasix, and Norvasc.  3.  Paroxysmal atrial fibrillation, Eliquis has been held in the setting of pacer pocket hematoma.  CHA2DS2-VASc score is 7.  Patient is being admitted to the hospitalist service with anticipated consultation with the infectious disease service as well.  Would repeat blood cultures, defer to infectious disease but vancomycin is likely indicated at this point.  Dr. Caryl Comes will follow up on the patient regarding question of pacemaker pocket infection and potential need for device extraction.  Signed, Rozann Lesches, MD  07/20/2019 7:42 PM

## 2019-07-20 NOTE — Anesthesia Postprocedure Evaluation (Signed)
Anesthesia Post Note  Patient: [name]  Procedure(s) Performed: TRANSESOPHAGEAL ECHOCARDIOGRAM (TEE) (N/A )     Patient location during evaluation: PACU Anesthesia Type: MAC Level of consciousness: awake and alert Pain management: pain level controlled Vital Signs Assessment: post-procedure vital signs reviewed and stable Respiratory status: spontaneous breathing, nonlabored ventilation and respiratory function stable Cardiovascular status: stable and blood pressure returned to baseline Anesthetic complications: no    Last Vitals:  Vitals:   07/18/19 0757 07/18/19 0913  BP: (!) 121/102   Pulse: 68   Resp:    Temp: 36.7 C   SpO2: 98% 96%                  Audry Pili

## 2019-07-20 NOTE — Telephone Encounter (Signed)
Pt states that she is still running a fever and that her Infectious disease doctor was readmitting her to hospital as we speak. Pt states she will let us know when she gets out of the hospital.

## 2019-07-20 NOTE — H&P (Signed)
History and Physical    [name] IWO:[id number] DOB: [birth year] DOA: 07/20/2019  PCP: Ann Held, DO  Patient coming from: Home.  Chief Complaint: Fever.  HPI: [name] is a 74 y.o. female with history of chronic respiratory failure on home oxygen 3 L secondary COPD, paroxysmal atrial fibrillation, pacemaker placement, CHF who was recently admitted for fever and discharged home on Jul 18, 2019 was admitted directly to the floor after patient started having persistent fever at home recording temperatures were 103 F.  Denies any chest pain productive cough nausea vomiting abdominal pain.  Did have one episode of diarrhea today.  It was nonbloody watery.  Patient recently had a pacemaker change following which which was complicated by hematoma formation and during last admission last week patient undergone TEE which did not show any signs of infection and since there was some suspicion for possible contribution to patient's fever patient was placed on Keflex for 10 days.  Patient in addition also was recently diagnosed with Mycobacterium avium intracellular complex infection for which patient is on rifabutin ethambutol and azithromycin.  ED Course: Patient is a direct admit.  Review of Systems: As per HPI, rest all negative.   Past Medical History:  Diagnosis Date  . Anemia   . Arthritis   . Atrial fibrillation ([place])   . CAD (coronary artery disease)    Mild disease per cath in 2011  . COPD (chronic obstructive pulmonary disease) ([place])    Home O2  . Diverticulitis   . Elbow fracture, left aug 2012  . Erythropoietin deficiency anemia 06/19/2018  . Essential hypertension   . Fibromyalgia   . Gastroparesis   . GERD (gastroesophageal reflux disease)   . H/O: GI bleed    from Pradaxa  . Hepatitis    Hx of in high school   . History of pneumonia    Recurrent  . Hx MRSA infection   . Hx of gastric ulcer   . Hx of stress fx 10/2009   Right hip   . Iron  malabsorption 08/14/2017  . LBBB (left bundle branch block)   . Oxygen dependent    2 liters via nasal cannula at all times  . Pacemaker    CRT therapy; followed by Dr. Caryl Comes  . Pleural effusion     s/p right thoracentesis 03/09  . Primary dilated cardiomyopathy (HCC)    EF 45 to 50% per echo in Jan 2012  . RLS (restless legs syndrome)   . Silent aspiration   . Small bowel obstruction ([place])   . Urinary incontinence   . Venous embolism and thrombosis of subclavian vein [facility])    After pacemaker insertion Oct 2011  . Vitamin B12 deficiency   . Wears glasses    [place]    Past Surgical History:  Procedure Laterality Date  . APPENDECTOMY    . BIV PACEMAKER GENERATOR CHANGEOUT N/A 06/19/2019   Procedure: BIV PACEMAKER GENERATOR CHANGEOUT;  Surgeon: Deboraha Sprang, MD;  Location: [facility];  Service: Cardiovascular;  Laterality: N/A;  . BRONCHIAL WASHINGS  05/25/2019   Procedure: BRONCHIAL WASHINGS;  Surgeon: Chesley Mires, MD;  [facility];  Service: Endoscopy;;  . CARDIAC CATHETERIZATION  04/08/2006, 11/16/2009  . CHOLECYSTECTOMY N/A 06/27/2017   Procedure: OPEN CHOLECYSTECTOMY;  Surgeon: Ralene Ok, MD;  Location: [facility];  Service: General;  Laterality: N/A;  . COLECTOMY     Intestinal resection (5times)  . COLECTOMY  02/04/2000   ex. lap.,  intra-abd. subtotal colectomy with ileosigmoid colon anastomosies and lysis of adhesions  . CYSTOSCOPY  11/11/2008  . CYSTOSCOPY W/ RETROGRADES  11/11/2008   right  . CYSTOSCOPY WITH INJECTION  04/30/2006   transurethral collagen injection; incision vaginal stenosis  . ERCP N/A 06/23/2017   Procedure: ENDOSCOPIC RETROGRADE CHOLANGIOPANCREATOGRAPHY (ERCP);  Surgeon: Carol Ada, MD;  Location: [facility];  Service: Endoscopy;  Laterality: N/A;  . ERCP N/A 06/24/2017   Procedure: ENDOSCOPIC RETROGRADE CHOLANGIOPANCREATOGRAPHY (ERCP);  Surgeon: [name], MD;  Location: [facility];  Service: Endoscopy;  Laterality: N/A;  .  ESOPHAGOGASTRODUODENOSCOPY N/A 02/02/2013   Procedure: ESOPHAGOGASTRODUODENOSCOPY (EGD);  Surgeon: Lafayette Dragon, MD;  Location: [facility];  Service: Endoscopy;  Laterality: N/A;  . EXPLORATORY LAPAROTOMY  04/27/2009   lysis of adhesions, gastrostomy tube  . GASTROCUTANEOUS FISTULA CLOSURE    . INTERSTIM IMPLANT PLACEMENT  05/28/2006 - stage I   06/05/2006 - stage II  . INTERSTIM IMPLANT PLACEMENT  02/05/2012   Procedure: [name] IMPLANT FIRST STAGE;  Surgeon: Reece Packer, MD;  [facility];  Service: Urology;  Laterality: N/A;  Replacement of Interstim Lead     . INTERSTIM IMPLANT REVISION  03/06/2011   Procedure: REVISION OF [name];  Surgeon: Reece Packer, MD;  [facility];  Service: Urology;  Laterality: N/A;  Replacement of Neurostimulator  . INTERSTIM IMPLANT REVISION  10/23/2007  . IR IMAGING GUIDED PORT INSERTION  10/14/2017  . PACEMAKER PLACEMENT  12/21/2009  . Peg removed     with complications  . PORT-A-CATH REMOVAL  07/27/2011   Procedure: REMOVAL PORT-A-CATH;  Surgeon: Rolm Bookbinder, MD;  Location: [facility];  Service: General;  Laterality: N/A;  . PORTACATH PLACEMENT  12/16/2009  . SAVORY DILATION N/A 02/02/2013   Procedure: SAVORY DILATION;  Surgeon: Lafayette Dragon, MD;  [facility];  Service: Endoscopy;  Laterality: N/A;  . SMALL INTESTINE SURGERY  05/20/2001   ex. lap., resection of small bowel stricture; gastrostomy; insertion central line  . TEE WITHOUT CARDIOVERSION N/A 07/17/2019   Procedure: TRANSESOPHAGEAL ECHOCARDIOGRAM (TEE);  Surgeon: Donato Heinz, MD;  Location: [facility] ENDOSCOPY;  Service: Cardiovascular;  Laterality: N/A;  . TONSILLECTOMY  age 43  . TOTAL ABDOMINAL HYSTERECTOMY     complete  . TOTAL HIP ARTHROPLASTY  08/03/2011   Procedure: TOTAL HIP ARTHROPLASTY ANTERIOR APPROACH;  Surgeon: Mcarthur Rossetti, MD;  [facility];  Service: Orthopedics;  Laterality: Right;  [name] VIDEO BRONCHOSCOPY N/A  05/25/2019   Procedure: VIDEO BRONCHOSCOPY WITHOUT FLUORO;  Surgeon: Chesley Mires, MD;  [facility];  Service: Endoscopy;  Laterality: N/A;     reports that she quit smoking about 33 years ago. She has a 20.00 pack-year smoking history. She has never used smokeless tobacco. She reports that she does not drink alcohol or use drugs.  Allergies  Allergen Reactions  . Dabigatran Etexilate Mesylate Other (See Comments)    INTERNAL BLEEDING- Pradaxa  . Augmentin [Amoxicillin-Pot Clavulanate] Diarrhea and Nausea Only    Did it involve swelling of the face/tongue/throat, SOB, or low BP? No Did it involve sudden or severe rash/hives, skin peeling, or any reaction on the inside of your mouth or nose? No Did you need to seek medical attention at a hospital or doctor's office? No When did it last happen?02/2019 If all above answers are "NO", may proceed with cephalosporin use.   [name] [Pentazocine] Other (See Comments)    Hallucinations     Family History  Problem Relation Age  of Onset  . Heart disease Father   . Stroke Mother   . Heart disease Mother   . Heart disease Brother   . Breast cancer Maternal Aunt   . Heart disease Paternal Aunt   . Diabetes Paternal Grandfather   . Colon cancer Neg Hx     Prior to Admission medications   Medication Sig Start Date End Date Taking? Authorizing Provider  acetaminophen (TYLENOL) 650 MG CR tablet Take 650 mg by mouth every 8 (eight) hours as needed for pain.   Yes [provider]  albuterol (PROVENTIL HFA) 108 (90 Base) MCG/ACT inhaler Inhale 2 puffs into the lungs every 6 (six) hours as needed for wheezing or shortness of breath.   Yes [provider]  amLODipine (NORVASC) 2.5 MG tablet Take 1 tablet (2.5 mg total) by mouth daily. Patient taking differently: Take 2.5 mg by mouth at bedtime.  09/05/18  Yes Minus Breeding, MD  atorvastatin (LIPITOR) 20 MG tablet TAKE 1 TABLET BY MOUTH ONCE DAILY AT  6PM Patient taking  differently: Take 20 mg by mouth every evening.  05/07/19  Yes Roma Schanz R, DO  azithromycin (ZITHROMAX) 500 MG tablet Take 1 tablet (500 mg total) by mouth 3 (three) times a week. Patient taking differently: Take 500 mg by mouth See admin instructions. Take 500 mg by mouth on Tues/Thurs/Sat 06/24/19  Yes Comer, Okey Regal, MD  carvedilol (COREG) 25 MG tablet Take 1 tablet (25 mg total) by mouth 2 (two) times daily. Patient taking differently: Take 25 mg by mouth at bedtime.  03/31/19  Yes Minus Breeding, MD  cephALEXin (KEFLEX) 500 MG capsule Take 1 capsule (500 mg total) by mouth 4 (four) times daily for 10 days. 07/18/19 07/28/19 Yes Gherghe, Vella Redhead, MD  denosumab (PROLIA) 60 MG/ML SOSY injection Inject 60 mg into the skin every 6 (six) months. 03/25/15  Yes [provider]  ethambutol (MYAMBUTOL) 100 MG tablet Take 2 tablets (200 mg total) by mouth 3 (three) times a week. Take with 2 tablets of 400 mg of ethambutol to equal 1000 mg total. Patient taking differently: Take 200 mg by mouth every Monday, Wednesday, and Friday.  06/24/19  Yes Comer, Okey Regal, MD  ethambutol (MYAMBUTOL) 400 MG tablet Take 2 tablets (800 mg total) by mouth 3 (three) times a week. Take with 2 tablets of 100 mg of ethambutol to equal 1000 mg total. Patient taking differently: Take 800 mg by mouth every Monday, Wednesday, and Friday.  06/24/19  Yes Comer, Okey Regal, MD  furosemide (LASIX) 40 MG tablet TAKE 1 TABLET BY MOUTH IN THE MORNING Patient taking differently: Take 40 mg by mouth in the morning.  05/01/19  Yes Minus Breeding, MD  gabapentin (NEURONTIN) 600 MG tablet Take 1 tablet by mouth 4 times daily Patient taking differently: Take 600 mg by mouth 4 (four) times daily.  07/20/19  Yes Roma Schanz R, DO  lidocaine-prilocaine (EMLA) cream Apply 1 application topically as needed. Patient taking differently: Apply 1 application topically as needed (for port access- to numb).  10/16/17  Yes Ennever, Rudell Cobb,  MD  nitrofurantoin, macrocrystal-monohydrate, (MACROBID) 100 MG capsule Take 100 mg by mouth in the morning.   Yes [provider]  nitroGLYCERIN (NITROSTAT) 0.4 MG SL tablet Place 1 tablet (0.4 mg total) under the tongue every 5 (five) minutes as needed. CHEST PAIN Patient taking differently: Place 0.4 mg under the tongue every 5 (five) minutes as needed for chest pain.  06/01/19  Yes Minus Breeding, MD  oxyCODONE-acetaminophen (PERCOCET) 10-325 MG tablet Take 1 tablet by mouth every 4 (four) hours as needed. PAIN Patient taking differently: Take 1 tablet by mouth See admin instructions. Take 1 tablet by mouth 4 times a day and an additional 1 tablet twice a day as needed for pain 07/02/19  Yes Lowne Lyndal Pulley R, DO  OXYGEN Inhale 3 L/min into the lungs continuous.    Yes [provider]  pantoprazole (PROTONIX) 40 MG tablet Take 1 tablet by mouth twice daily Patient taking differently: Take 40 mg by mouth 2 (two) times daily.  07/20/19  Yes Gatha Mayer, MD  promethazine (PHENERGAN) 25 MG tablet Take 25 mg by mouth every 6 (six) hours as needed for nausea or vomiting.   Yes [provider]  rifabutin (MYCOBUTIN) 150 MG capsule Take 2 capsules (300 mg total) by mouth 3 (three) times a week. Patient taking differently: Take 300 mg by mouth every Monday, Wednesday, and Friday.  06/24/19  Yes Comer, Okey Regal, MD  tiZANidine (ZANAFLEX) 4 MG tablet Take 1 tablet (4 mg total) by mouth 3 (three) times daily as needed. for muscle spams Patient taking differently: Take 4 mg by mouth 3 (three) times daily as needed for muscle spasms.  05/07/19  Yes Roma Schanz R, DO  triamcinolone ointment (KENALOG) 0.1 % Apply 1 application topically daily as needed (to affected areas- for psoriasis).  03/19/18  Yes [provider]  umeclidinium bromide (INCRUSE ELLIPTA) 62.5 MCG/INH AEPB Inhale 1 puff into the lungs daily. 09/09/18  Yes Lauraine Rinne, NP  zolpidem (AMBIEN) 5 MG  tablet TAKE 1 TABLET BY MOUTH EVERY DAY AT BEDTIME AS NEEDED Patient taking differently: Take 5 mg by mouth at bedtime.  07/13/19  Yes Ann Held, DO  apixaban (ELIQUIS) 5 MG TABS tablet Take 5 mg by mouth 2 (two) times daily.    [provider]  iron dextran complex (INFED) 50 MG/ML injection Please give infed infusion (no test dose needed) over 4 hours per pharmacy calculated dose. Ht: 5'2 Wt: 99 pounds Hgb: 12 12/20/10 12/16/18  Lafayette Dragon, MD    Physical Exam: Constitutional: Moderately built and nourished. Vitals:   07/20/19 1845  BP: (!) 168/75  Pulse: 79  Resp: 18  Temp: 98 F (36.7 C)  TempSrc: Oral  SpO2: 99%   Eyes: Anicteric no pallor. ENMT: No discharge from the ears eyes nose or mouth. Neck: No mass felt.  No neck rigidity. Respiratory: No rhonchi or crepitations. Cardiovascular: S1-S2 heard. Abdomen: Soft nontender bowel sounds present. Musculoskeletal: No edema. Skin: No rash. Neurologic: Alert awake oriented time place and person.  Moves all extremities. Psychiatric: Appears normal per normal affect.   Labs on Admission: I have personally reviewed following labs and imaging studies  CBC: Recent Labs  Lab 07/15/19 1928 07/16/19 1921 07/17/19 0412 07/18/19 0445  WBC 4.7 4.0 3.6* 3.3*  NEUTROABS 4.0  --   --   --   HGB 11.3* 9.1* 9.3* 9.0*  HCT 37.7 30.4* 30.9* 30.7*  MCV 94.5 93.5 93.6 94.5  PLT 163 154 156 469*   Basic Metabolic Panel: Recent Labs  Lab 07/15/19 1928 07/16/19 1921 07/17/19 0412 07/18/19 0445  NA 144 137 137 140  K 4.1 4.5 4.6 4.3  CL 104 100 102 105  CO2 25 29 28 27   GLUCOSE 82 115* 88 105*  BUN 21 24* 23 17  CREATININE 0.98 1.22* 1.01* 0.88  CALCIUM  7.6* 7.5* 7.6* 7.8*  MG  --   --   --  2.0   GFR: Estimated Creatinine Clearance: 35 mL/min (by C-G formula based on SCr of 0.88 mg/dL). Liver Function Tests: Recent Labs  Lab 07/15/19 1928 07/18/19 0445  AST 28 19  ALT 10 9  ALKPHOS 67 50    BILITOT 0.6 0.7  PROT 6.6 5.7*  ALBUMIN 3.3* 2.5*   No results for input(s): LIPASE, AMYLASE in the last 168 hours. No results for input(s): AMMONIA in the last 168 hours. Coagulation Profile: Recent Labs  Lab 07/15/19 1958  INR 1.1   Cardiac Enzymes: No results for input(s): CKTOTAL, CKMB, CKMBINDEX, TROPONINI in the last 168 hours. BNP (last 3 results) No results for input(s): PROBNP in the last 8760 hours. HbA1C: No results for input(s): HGBA1C in the last 72 hours. CBG: No results for input(s): GLUCAP in the last 168 hours. Lipid Profile: No results for input(s): CHOL, HDL, LDLCALC, TRIG, CHOLHDL, LDLDIRECT in the last 72 hours. Thyroid Function Tests: No results for input(s): TSH, T4TOTAL, FREET4, T3FREE, THYROIDAB in the last 72 hours. Anemia Panel: No results for input(s): VITAMINB12, FOLATE, FERRITIN, TIBC, IRON, RETICCTPCT in the last 72 hours. Urine analysis:    Component Value Date/Time   COLORURINE YELLOW 07/16/2019 1517   APPEARANCEUR CLOUDY (A) 07/16/2019 1517   LABSPEC 1.044 (H) 07/16/2019 1517   PHURINE 5.0 07/16/2019 1517   GLUCOSEU NEGATIVE 07/16/2019 1517   GLUCOSEU NEGATIVE 07/24/2011 0908   HGBUR SMALL (A) 07/16/2019 1517   HGBUR negative 05/19/2010 1609   BILIRUBINUR NEGATIVE 07/16/2019 1517   BILIRUBINUR neg 12/09/2017 1512   KETONESUR NEGATIVE 07/16/2019 1517   PROTEINUR 30 (A) 07/16/2019 1517   UROBILINOGEN 0.2 12/09/2017 1512   UROBILINOGEN 0.2 05/17/2014 1217   NITRITE NEGATIVE 07/16/2019 1517   LEUKOCYTESUR LARGE (A) 07/16/2019 1517   Sepsis Labs: @LABRCNTIP (procalcitonin:4,lacticidven:4) ) Recent Results (from the past 240 hour(s))  Blood Culture (routine x 2)     Status: None   Collection Time: 07/15/19  7:21 PM   Specimen: BLOOD RIGHT FOREARM  Result Value Ref Range Status   Specimen Description BLOOD RIGHT FOREARM  Final   Special Requests   Final    BOTTLES DRAWN AEROBIC ONLY Blood Culture adequate volume   Culture   Final     NO GROWTH 5 DAYS Performed at [facility], [place] [address] [place], [place] [address]    Report Status 07/20/2019 FINAL  Final  Blood Culture (routine x 2)     Status: None   Collection Time: 07/15/19  7:32 PM   Specimen: BLOOD LEFT HAND  Result Value Ref Range Status   Specimen Description BLOOD LEFT HAND  Final   Special Requests   Final    BOTTLES DRAWN AEROBIC AND ANAEROBIC Blood Culture adequate volume   Culture   Final    NO GROWTH 5 DAYS Performed at [facility], [place] [address], [place], [place] [address]    Report Status 07/20/2019 FINAL  Final  Respiratory Panel by RT PCR (Flu A&B, Covid) - Nasopharyngeal Swab     Status: None   Collection Time: 07/15/19  7:55 PM   Specimen: Nasopharyngeal Swab  Result Value Ref Range Status   SARS Coronavirus 2 by RT PCR NEGATIVE NEGATIVE Final    Comment: (NOTE) SARS-CoV-2 target nucleic acids are NOT DETECTED. The SARS-CoV-2 RNA is generally detectable in upper respiratoy specimens during the acute phase of infection. The lowest concentration of  SARS-CoV-2 viral copies this assay can detect is 131 copies/mL. A negative result does not preclude SARS-Cov-2 infection and should not be used as the sole basis for treatment or other patient management decisions. A negative result may occur with  improper specimen collection/handling, submission of specimen other than nasopharyngeal swab, presence of viral mutation(s) within the areas targeted by this assay, and inadequate number of viral copies (<131 copies/mL). A negative result must be combined with clinical observations, patient history, and epidemiological information. The expected result is Negative. Fact Sheet for Patients:  PinkCheek.be Fact Sheet for Healthcare Providers:  GravelBags.it This test is not yet ap proved or cleared by the [place] FDA and  has been authorized for detection and/or diagnosis  of SARS-CoV-2 by FDA under an Emergency Use Authorization (EUA). This EUA will remain  in effect (meaning this test can be used) for the duration of the COVID-19 declaration under Section 564(b)(1) of the Act, 21 U.S.C. section 360bbb-3(b)(1), unless the authorization is terminated or revoked sooner.    Influenza A by PCR NEGATIVE NEGATIVE Final   Influenza B by PCR NEGATIVE NEGATIVE Final    Comment: (NOTE) The Xpert Xpress SARS-CoV-2/FLU/RSV assay is intended as an aid in  the diagnosis of influenza from Nasopharyngeal swab specimens and  should not be used as a sole basis for treatment. Nasal washings and  aspirates are unacceptable for Xpert Xpress SARS-CoV-2/FLU/RSV  testing. Fact Sheet for Patients: PinkCheek.be Fact Sheet for Healthcare Providers: GravelBags.it This test is not yet approved or cleared by the [place] FDA and  has been authorized for detection and/or diagnosis of SARS-CoV-2 by  FDA under an Emergency Use Authorization (EUA). This EUA will remain  in effect (meaning this test can be used) for the duration of the  Covid-19 declaration under Section 564(b)(1) of the Act, 21  U.S.C. section 360bbb-3(b)(1), unless the authorization is  terminated or revoked. Performed at [facility], [place] [address], [place], [place] [address]   Urine culture     Status: Abnormal   Collection Time: 07/16/19  3:12 PM   Specimen: In/Out Cath Urine  Result Value Ref Range Status   Specimen Description IN/OUT CATH URINE  Final   Special Requests   Final    NONE Performed at [facility], [place] [address], [place],  [address]    Culture >=100,000 COLONIES/mL KLEBSIELLA PNEUMONIAE (A)  Final   Report Status 07/18/2019 FINAL  Final   Organism ID, Bacteria KLEBSIELLA PNEUMONIAE (A)  Final      Susceptibility   Klebsiella pneumoniae - MIC*    AMPICILLIN RESISTANT Resistant     CEFAZOLIN <=4 SENSITIVE  Sensitive     CEFTRIAXONE <=1 SENSITIVE Sensitive     CIPROFLOXACIN <=0.25 SENSITIVE Sensitive     GENTAMICIN <=1 SENSITIVE Sensitive     IMIPENEM <=0.25 SENSITIVE Sensitive     NITROFURANTOIN 64 INTERMEDIATE Intermediate     TRIMETH/SULFA <=20 SENSITIVE Sensitive     AMPICILLIN/SULBACTAM 4 SENSITIVE Sensitive     PIP/TAZO <=4 SENSITIVE Sensitive     * >=100,000 COLONIES/mL KLEBSIELLA PNEUMONIAE     Radiological Exams on Admission: No results found.   Assessment/Plan Principal Problem:   Acute febrile illness Active Problems:   Essential hypertension   Congestive dilated cardiomyopathy (HCC)   COPD with emphysema (HCC)   Chronic hypoxemic respiratory failure (HCC)   Pulmonary MAI (mycobacterium avium-intracellulare) infection (HCC)   Fever    1. Fever of unknown origin -I have ordered  blood cultures sed rate CRP and will continue present antibiotics including Keflex ethambutol rifabutin and azithromycin which are taken for MAI.  Keflex was empirically given by infectious disease.  Please consult infectious disease in the morning for further recommendations.  Patient's chest x-ray UA and basic labs are pending at this time.  Note that patient did have one episode of diarrhea today if there is any further episode may need further stool study checks. 2. A. fib presently apixaban on hold secondary to hematoma forming at the pacemaker lead.  On beta-blockers for rate control. 3. COPD not actively wheezing continue home medications. 4. Hypertension on beta-blockers and amlodipine. 5. Pancytopenia from recent labs.  Pending labs. 6. CHF appears compensated continue Lasix in the morning.  Since patient has persistent fever with cough not clear with admitted as inpatient for further work-up.  Chest x-ray UA basic metabolic panel LFTs CBC blood cultures sed rate CRP are pending.   DVT prophylaxis: SCDs for now since patient has hematoma around the pacemaker bed. Code Status: Full  code. Family Communication: Discussed with patient. Disposition Plan: Home. Consults called: None. Admission status: Inpatient.   Rise Patience MD [facility] Pager [phone].  If 7PM-7AM, please contact night-coverage [email or web address] Password [facility]  07/20/2019, 8:13 PM

## 2019-07-21 ENCOUNTER — Other Ambulatory Visit: Payer: Self-pay

## 2019-07-21 ENCOUNTER — Ambulatory Visit: Payer: HMO | Admitting: Pharmacist

## 2019-07-21 DIAGNOSIS — A310 Pulmonary mycobacterial infection: Secondary | ICD-10-CM

## 2019-07-21 DIAGNOSIS — D708 Other neutropenia: Secondary | ICD-10-CM

## 2019-07-21 DIAGNOSIS — R509 Fever, unspecified: Secondary | ICD-10-CM | POA: Diagnosis not present

## 2019-07-21 DIAGNOSIS — T82837D Hemorrhage of cardiac prosthetic devices, implants and grafts, subsequent encounter: Secondary | ICD-10-CM

## 2019-07-21 LAB — SARS CORONAVIRUS 2 (TAT 6-24 HRS): SARS Coronavirus 2: NEGATIVE

## 2019-07-21 LAB — URINALYSIS, ROUTINE W REFLEX MICROSCOPIC
Bilirubin Urine: NEGATIVE
Glucose, UA: NEGATIVE mg/dL
Hgb urine dipstick: NEGATIVE
Ketones, ur: NEGATIVE mg/dL
Nitrite: NEGATIVE
Protein, ur: 30 mg/dL — AB
Specific Gravity, Urine: 1.018 (ref 1.005–1.030)
pH: 5 (ref 5.0–8.0)

## 2019-07-21 LAB — T4, FREE: Free T4: 0.68 ng/dL (ref 0.61–1.12)

## 2019-07-21 MED ORDER — IPRATROPIUM-ALBUTEROL 0.5-2.5 (3) MG/3ML IN SOLN: 3.0000 mL | RESPIRATORY_TRACT | Status: DC | PRN

## 2019-07-21 MED ORDER — SODIUM CHLORIDE 0.9% FLUSH
10.0000 mL | Freq: Two times a day (BID) | INTRAVENOUS | Status: DC
  Administered 2019-07-21 – 2019-07-23 (×5): 10 mL

## 2019-07-21 MED ORDER — POLYETHYLENE GLYCOL 3350 17 G PO PACK
17.0000 g | PACK | Freq: Every day | ORAL | Status: DC | PRN
  Filled 2019-07-21: qty 1

## 2019-07-21 MED ORDER — SODIUM CHLORIDE 0.9% FLUSH: 10.0000 mL | INTRAVENOUS | Status: DC | PRN

## 2019-07-21 MED ORDER — OXYCODONE HCL 5 MG PO TABS
5.0000 mg | ORAL_TABLET | ORAL | Status: DC
  Administered 2019-07-21 – 2019-07-23 (×10): 5 mg via ORAL
  Filled 2019-07-21 (×10): qty 1

## 2019-07-21 MED ORDER — OXYCODONE-ACETAMINOPHEN 5-325 MG PO TABS
1.0000 | ORAL_TABLET | Freq: Four times a day (QID) | ORAL | Status: DC | PRN
  Administered 2019-07-21 – 2019-07-23 (×3): 1 via ORAL
  Filled 2019-07-21 (×3): qty 1

## 2019-07-21 MED ORDER — SENNOSIDES-DOCUSATE SODIUM 8.6-50 MG PO TABS
2.0000 | ORAL_TABLET | Freq: Every evening | ORAL | Status: DC | PRN
  Filled 2019-07-21: qty 2

## 2019-07-21 MED ORDER — ACETAMINOPHEN 325 MG PO TABS
650.0000 mg | ORAL_TABLET | Freq: Four times a day (QID) | ORAL | Status: DC | PRN
  Administered 2019-07-21 – 2019-07-22 (×3): 650 mg via ORAL
  Filled 2019-07-21 (×3): qty 2

## 2019-07-21 MED ORDER — CHLORHEXIDINE GLUCONATE CLOTH 2 % EX PADS
6.0000 | MEDICATED_PAD | Freq: Every day | CUTANEOUS | Status: DC
  Administered 2019-07-21 – 2019-07-22 (×2): 6 via TOPICAL

## 2019-07-21 NOTE — Consult Note (Addendum)
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 female with COPD on 4L [place], afib on anticoagulation, pulmonary MAI on treatment (azithro, rifabutin, emb TIW) through dr comer beginning of april, and hx of pacemaker battery replacement in early April as well.  Since device change due to end of battery life, she has had a pocket of fluid develop left upper chest wall. She was initially admitted on 4/29 for fever and chest pain. Had TEE which was negative for endocarditis and discharged on 5/1. She is readmitted for repeat isolated fever with new pain to her axilla near her PPM pocket and fluid collection. It is not warm or erythematous but tender to raising her arm. Labs show that her WBC is slightly lower than her baseline.  Past Medical History:  Diagnosis Date  . Anemia   . Arthritis   . Atrial fibrillation ([place])   . CAD (coronary artery disease)    Mild disease per cath in 2011  . COPD (chronic obstructive pulmonary disease) ([place])    Home O2  . Diverticulitis   . Elbow fracture, left aug 2012  . Erythropoietin deficiency anemia 06/19/2018  . Essential hypertension   . Fibromyalgia   . Gastroparesis   . GERD (gastroesophageal reflux disease)   . H/O: GI bleed    from Pradaxa  . Hepatitis    Hx of in high school   . History of pneumonia    Recurrent  . Hx MRSA infection   . Hx of gastric ulcer   . Hx of stress fx 10/2009   Right hip   . Iron malabsorption 08/14/2017  . LBBB (left bundle branch block)   . Oxygen dependent    2 liters via nasal cannula at  all times  . Pacemaker    CRT therapy; followed by Dr. Caryl Comes  . Pleural effusion     s/p right thoracentesis 03/09  . Primary dilated cardiomyopathy (HCC)    EF 45 to 50% per echo in Jan 2012  . RLS (restless legs syndrome)   . Silent aspiration   . Small bowel obstruction ([place])   . Urinary incontinence   . Venous embolism and thrombosis of subclavian vein [facility])    After pacemaker insertion Oct 2011  . Vitamin B12 deficiency   . Wears glasses    Reading    Allergies:  Allergies  Allergen Reactions  . Dabigatran Etexilate Mesylate Other (See Comments)    INTERNAL BLEEDING- Pradaxa  . Augmentin [Amoxicillin-Pot Clavulanate] Diarrhea and Nausea Only    Did it involve swelling of the face/tongue/throat, SOB, or low BP? No Did it involve sudden or severe rash/hives, skin peeling, or any reaction on the inside of your mouth or nose? No Did you need to seek medical attention at a hospital or doctor's office? No When did it last happen?02/2019 If all above answers are "NO", may proceed with cephalosporin use.   [name] [Pentazocine] Other (See Comments)    Hallucinations     MEDICATIONS: . amLODipine  2.5 mg Oral QHS  . atorvastatin  20 mg Oral QPM  . azithromycin  500 mg Oral Q T,Th,Sat-1800  . carvedilol  25 mg Oral QHS  . cephALEXin  500 mg Oral QID  . Chlorhexidine Gluconate Cloth  6 each Topical Daily  . [START ON 07/22/2019] ethambutol  1,000 mg Oral Q M,W,F  . furosemide  40 mg Oral q AM  . gabapentin  600 mg Oral QID  . oxyCODONE  5 mg Oral Q4H  . pantoprazole  40 mg Oral BID  . [START ON 07/22/2019] rifabutin  300 mg Oral Q M,W,F  . sodium chloride flush  10-40 mL Intracatheter Q12H  . umeclidinium bromide  1 puff Inhalation Daily  . zolpidem  5 mg Oral QHS    Social History   Tobacco Use  . Smoking status: Former Smoker    Packs/day: 1.00    Years: 20.00    Pack years: 20.00    Quit date: 03/19/1986    Years since quitting: 33.3  . Smokeless tobacco: Never  Used  Substance Use Topics  . Alcohol use: No    Alcohol/week: 0.0 standard drinks  . Drug use: No    Family History  Problem Relation Age of Onset  . Heart disease Father   . Stroke Mother   . Heart disease Mother   . Heart disease Brother   . Breast cancer Maternal Aunt   . Heart disease Paternal Aunt   . Diabetes Paternal Grandfather   . Colon cancer Neg Hx     Review of Systems -  12 point ros is negative except what is mentioned in hpi with right axilla pain and pacemaker pocket fullness and fevers. Still has ongoing baseline cough.  OBJECTIVE: Temp:  [97.8 F (36.6 C)-98.4 F (36.9 C)] 98.2 F (36.8 C) (05/04 1424) Pulse Rate:  [73-79] 73 (05/04 1424) Resp:  [13-18] 15 (05/04 1645) BP: (118-168)/(56-90) 125/90 (05/04 1424) SpO2:  [98 %-99 %] 99 % (05/04 1424) Weight:  [41.2 kg] 41.2 kg (05/04 0430) Physical Exam  Constitutional:  oriented to person, place, and time. appears well-developed and well-nourished. No distress.  HENT: [place]/AT, PERRLA, no scleral icterus Mouth/Throat: Oropharynx is clear and moist. No oropharyngeal exudate.  Cardiovascular: Normal rate, regular rhythm and normal heart sounds. Exam reveals no gallop and no friction rub.  No murmur heard.  Chest wall = fluctuant pacemaker pocket slight erythema on axillary edge Pulmonary/Chest: Effort normal and breath sounds normal. No respiratory distress.  has no wheezes.  Neck = supple, no nuchal rigidity Abdominal: Soft. Bowel sounds are normal.  exhibits no distension. There is no tenderness.  Lymphadenopathy: tender axillary adenopathy Neurological: alert and oriented to person, place, and time.  Skin: Skin is warm and dry. No rash noted. No erythema.  Psychiatric: a normal mood and affect.  behavior is normal.    LABS: Results for orders placed or performed during the hospital encounter of 07/20/19 (from the past 48 hour(s))  SARS CORONAVIRUS 2 (TAT 6-24 HRS) Nasopharyngeal Nasopharyngeal Swab      Status: None   Collection Time: 07/20/19  7:03 PM   Specimen: Nasopharyngeal Swab  Result Value Ref Range   SARS Coronavirus 2 NEGATIVE NEGATIVE    Comment: (NOTE) SARS-CoV-2 target nucleic acids are NOT DETECTED. The SARS-CoV-2 RNA is generally detectable in upper and lower respiratory specimens during the acute phase of infection. Negative results do not preclude SARS-CoV-2 infection, do not rule out co-infections with other pathogens, and should not  be used as the sole basis for treatment or other patient management decisions. Negative results must be combined with clinical observations, patient history, and epidemiological information. The expected result is Negative. Fact Sheet for Patients: SugarRoll.be Fact Sheet for Healthcare Providers: https://www.woods-mathews.com/ This test is not yet approved or cleared by the [place] FDA and  has been authorized for detection and/or diagnosis of SARS-CoV-2 by FDA under an Emergency Use Authorization (EUA). This EUA will remain  in effect (meaning this test can be used) for the duration of the COVID-19 declaration under Section 56 4(b)(1) of the Act, 21 U.S.C. section 360bbb-3(b)(1), unless the authorization is terminated or revoked sooner. Performed at [facility], [place] [address] [place], [place] [address]   Folate     Status: None   Collection Time: 07/20/19  8:05 PM  Result Value Ref Range   Folate 9.2 >5.9 ng/mL    Comment: Performed at [facility], [place] [address], [place], [place] [address]  Reticulocytes     Status: Abnormal   Collection Time: 07/20/19  8:05 PM  Result Value Ref Range   Retic Ct Pct 1.0 0.4 - 3.1 %   RBC. 3.57 (L) 3.87 - 5.11 MIL/uL    Comment: RESULTS CONFIRMED BY MANUAL DILUTION   Retic Count, Absolute 34.5 19.0 - 186.0 K/uL   Immature Retic Fract 6.6 2.3 - 15.9 %    Comment: Performed at [facility], [place] [address] [place], [place]  [address]  TSH     Status: Abnormal   Collection Time: 07/20/19  8:05 PM  Result Value Ref Range   TSH 7.708 (H) 0.350 - 4.500 uIU/mL    Comment: Performed by a 3rd Generation assay with a functional sensitivity of <=0.01 uIU/mL. Performed at [facility], [place] [address], [place], [place] [address]   Culture, blood (routine x 2)     Status: None (Preliminary result)   Collection Time: 07/20/19  8:13 PM   Specimen: BLOOD  Result Value Ref Range   Specimen Description BLOOD RIGHT ANTECUBITAL    Special Requests      BOTTLES DRAWN AEROBIC AND ANAEROBIC Blood Culture results may not be optimal due to an excessive volume of blood received in culture bottles   Culture      NO GROWTH < 12 HOURS Performed at [facility] [address] [place], [place] [address]    Report Status PENDING   Type and screen [place]     Status: None   Collection Time: 07/20/19  8:20 PM  Result Value Ref Range   ABO/RH(D) A NEG    Antibody Screen NEG    Sample Expiration      07/23/2019,2359 Performed at [facility], [place] [address] [place], [place] [address]   Comprehensive metabolic panel     Status: Abnormal   Collection Time: 07/20/19  8:30 PM  Result Value Ref Range   Sodium 144 135 - 145 mmol/L   Potassium 5.1 3.5 - 5.1 mmol/L   Chloride 112 (H) 98 - 111 mmol/L   CO2 26 22 - 32 mmol/L   Glucose, Bld 98 70 - 99 mg/dL    Comment: Glucose reference range applies only to samples taken after fasting for at least 8 hours.   BUN 16 8 - 23 mg/dL   Creatinine, Ser 0.95 0.44 - 1.00 mg/dL   Calcium 7.7 (L) 8.9 - 10.3 mg/dL   Total Protein  6.1 (L) 6.5 - 8.1 g/dL   Albumin 2.8 (L) 3.5 - 5.0 g/dL   AST 22 15 - 41 U/L   ALT 14 0 - 44 U/L   Alkaline Phosphatase 60 38 - 126 U/L   Total Bilirubin 0.5 0.3 - 1.2 mg/dL   GFR calc non Af Amer 59 (L) >60 mL/min   GFR calc Af Amer >60 >60 mL/min   Anion gap 6 5 - 15    Comment: Performed at [facility] [address], [place], [place] [address]  CBC with Differential/Platelet     Status: Abnormal   Collection Time: 07/20/19  8:30 PM  Result Value Ref Range   WBC 2.8 (L) 4.0 - 10.5 K/uL   RBC 3.61 (L) 3.87 - 5.11 MIL/uL   Hemoglobin 10.0 (L) 12.0 - 15.0 g/dL   HCT 34.1 (L) 36.0 - 46.0 %   MCV 94.5 80.0 - 100.0 fL   MCH 27.7 26.0 - 34.0 pg   MCHC 29.3 (L) 30.0 - 36.0 g/dL   RDW 15.0 11.5 - 15.5 %   Platelets 232 150 - 400 K/uL   nRBC 0.0 0.0 - 0.2 %   Neutrophils Relative % 67 %   Neutro Abs 1.9 1.7 - 7.7 K/uL   Lymphocytes Relative 15 %   Lymphs Abs 0.4 (L) 0.7 - 4.0 K/uL   Monocytes Relative 14 %   Monocytes Absolute 0.4 0.1 - 1.0 K/uL   Eosinophils Relative 3 %   Eosinophils Absolute 0.1 0.0 - 0.5 K/uL   Basophils Relative 0 %   Basophils Absolute 0.0 0.0 - 0.1 K/uL   Immature Granulocytes 1 %   Abs Immature Granulocytes 0.02 0.00 - 0.07 K/uL    Comment: Performed at [facility], [place] [address] [place] [address] [place], [place] [address]  Vitamin B12     Status: Abnormal   Collection Time: 07/20/19  8:30 PM  Result Value Ref Range   Vitamin B-12 128 (L) 180 - 914 pg/mL    Comment: (NOTE) This assay is not validated for testing neonatal or myeloproliferative syndrome specimens for Vitamin B12 levels. Performed at [facility], [place] [address], [place], [place] [address]   Iron and TIBC     Status: Abnormal   Collection Time: 07/20/19  8:30 PM  Result Value Ref Range   Iron 21 (L) 28 - 170 ug/dL   TIBC 181 (L) 250 - 450 ug/dL   Saturation Ratios 12 10.4 - 31.8 %   UIBC 160 ug/dL    Comment: Performed at [facility] [address], [place], [place] [address]  Ferritin     Status: Abnormal   Collection Time: 07/20/19  8:30 PM  Result Value Ref Range   Ferritin 1,432 (H) 11 - 307 ng/mL    Comment: Performed at [facility] [address], [place], [place] [address]  Lactate dehydrogenase     Status: Abnormal   Collection Time: 07/20/19  8:30 PM  Result Value Ref Range    LDH 279 (H) 98 - 192 U/L    Comment: Performed at [facility] [address], [place], [place] [address]  C-reactive protein     Status: Abnormal   Collection Time: 07/20/19  8:30 PM  Result Value Ref Range   CRP 2.2 (H) <1.0 mg/dL    Comment: Performed at [facility] [address] [place], [place] [address]  Sedimentation rate  Status: Abnormal   Collection Time: 07/20/19  8:30 PM  Result Value Ref Range   Sed Rate 49 (H) 0 - 22 mm/hr    Comment: Performed at [facility] [address], [place], [place] [address]  T4, free     Status: None   Collection Time: 07/20/19  8:30 PM  Result Value Ref Range   Free T4 0.68 0.61 - 1.12 ng/dL    Comment: (NOTE) Biotin ingestion may interfere with free T4 tests. If the results are inconsistent with the TSH level, previous test results, or the clinical presentation, then consider biotin interference. If needed, order repeat testing after stopping biotin. Performed at [facility], [place] [address], [place], [place] [address]   Culture, blood (routine x 2)     Status: None (Preliminary result)   Collection Time: 07/20/19  9:36 PM   Specimen: BLOOD RIGHT ARM  Result Value Ref Range   Specimen Description BLOOD RIGHT ARM    Special Requests      BOTTLES DRAWN AEROBIC AND ANAEROBIC Blood Culture adequate volume   Culture      NO GROWTH < 12 HOURS Performed at [facility], [place] [address] [place] [address], [place], [place] [address]    Report Status PENDING   Urinalysis, Routine w reflex microscopic     Status: Abnormal   Collection Time: 07/21/19  5:05 AM  Result Value Ref Range   Color, Urine YELLOW YELLOW   APPearance CLEAR CLEAR   Specific Gravity, Urine 1.018 1.005 - 1.030   pH 5.0 5.0 - 8.0   Glucose, UA NEGATIVE NEGATIVE mg/dL   Hgb urine dipstick NEGATIVE NEGATIVE   Bilirubin Urine NEGATIVE NEGATIVE   Ketones, ur NEGATIVE NEGATIVE mg/dL   Protein, ur 30 (A) NEGATIVE mg/dL   Nitrite NEGATIVE NEGATIVE    Leukocytes,Ua TRACE (A) NEGATIVE   RBC / HPF 0-5 0 - 5 RBC/hpf   WBC, UA 11-20 0 - 5 WBC/hpf   Bacteria, UA RARE (A) NONE SEEN   Squamous Epithelial / LPF 0-5 0 - 5   Mucus PRESENT    Hyaline Casts, UA PRESENT     Comment: Performed at [facility], [address] [place], [place] [address]   *Note: Due to a large number of results and/or encounters for the requested time period, some results have not been displayed. A complete set of results can be found in Results Review.    MICRO: reviewed IMAGING: DG CHEST PORT 1 VIEW  Result Date: 07/20/2019 CLINICAL DATA:  Fever EXAM: PORTABLE CHEST 1 VIEW COMPARISON:  07/15/2019 FINDINGS: Right Port-A-Cath and left pacer remain in place, unchanged. Heart is normal size. Chronic nodular opacities within the lungs bilaterally, particularly upper lobes, unchanged. No effusions. No acute bony abnormality. IMPRESSION: Stable nodular opacities in the lungs, particularly upper lobes. This likely reflects previously seen mycobacterium avium infection changes. Electronically Signed   By: Rolm Baptise M.D.   On: 07/20/2019 20:39    Assessment/Plan:  74yo F with pacemaker device implant replacement in early April who has developed fluid within pocket possibly thought to be seroma however has intermittent fever and now axillary pain/discomfort. Recent echo <7d did not show any endocarditis  - recommend to stop MAI treatment for the time being to see if it causing leukopenia - will need to discuss with dr Olin Pia team that this is presumed cardiac device infection, best course of action is to explant device. would advocate for removal of device -  alternatively if we presumably treat it still would need to have source control with pocket being washed out vs. decompression of fluid pocket so that she can have pain relief and more mobility of left arm - possibly this is indolent infection that would cause intermittent fever. Currently on cephalexin for uti but  also would treat tissue/skin infections - uti - nearly complete in finishing her course that was started on 4/30 - will follow up blood cx   Rachna Schonberger B. [facility] for Infectious Diseases [phone]

## 2019-07-21 NOTE — Progress Notes (Signed)
Progress Note  Patient Name: [name] Date of Encounter: 07/21/2019  Primary Cardiologist: Minus Breeding, MD  Patient Profile:    [name] is a 74 y.o. female with a history of PAF with previous TIA, mild nonobstructive CAD as of 2011, COPD with chronic hypoxic respiratory failure on oxygen, recurrent pneumonia with MAI, iron deficiency anemia, chronic systolic heart failure with nonischemic cardiomyopathy, left bundle branch block status post CRT P,admitted for  recurrent fevers.    Subjective  Without complaints   No fevers overnight  Inpatient Medications    Scheduled Meds:  amLODipine  2.5 mg Oral QHS   atorvastatin  20 mg Oral QPM   azithromycin  500 mg Oral Q T,Th,Sat-1800   carvedilol  25 mg Oral QHS   cephALEXin  500 mg Oral QID   Chlorhexidine Gluconate Cloth  6 each Topical Daily   [START ON 07/22/2019] ethambutol  1,000 mg Oral Q M,W,F   furosemide  40 mg Oral q AM   gabapentin  600 mg Oral QID   pantoprazole  40 mg Oral BID   [START ON 07/22/2019] rifabutin  300 mg Oral Q M,W,F   sodium chloride flush  10-40 mL Intracatheter Q12H   umeclidinium bromide  1 puff Inhalation Daily   zolpidem  5 mg Oral QHS   Continuous Infusions:   PRN Meds: albuterol, nitroGLYCERIN, ondansetron **OR** ondansetron (ZOFRAN) IV, oxyCODONE-acetaminophen **AND** oxyCODONE, sodium chloride flush, tiZANidine   Vital Signs    Vitals:   07/20/19 1845 07/20/19 2052 07/21/19 0042 07/21/19 0430  BP: (!) 168/75 133/72 (!) 118/56 118/70  Pulse: 79 74 73 73  Resp: 18 16 13 15   Temp: 98 F (36.7 C) 98.4 F (36.9 C) 98.1 F (36.7 C) 98 F (36.7 C)  TempSrc: Oral Oral Oral Oral  SpO2: 99% 99% 98% 99%  Weight:    41.2 kg    Intake/Output Summary (Last 24 hours) at 07/21/2019 0646 Last data filed at 07/21/2019 0500 Gross per 24 hour  Intake --  Output 325 ml  Net -325 ml   Last 3 Weights 07/21/2019 07/18/2019 07/16/2019  Weight (lbs) 90 lb 13.3 oz 87 lb 88 lb 8 oz  Weight (kg)  41.2 kg 39.463 kg 40.143 kg      Telemetry    Sinus w P-synchronous/ AV  pacing - Personally Reviewed  ECG     Physical Exam    GEN: No acute distress.  Wearing O2 --her norm  cachectic Chest pocket fluid filled, but not warm to touch nor erythematous Cardiac: RRR, no murmurs, rubs, or gallops.  Respiratory: Clear to auscultation bilaterally. GI: Soft, nontender, non-distended  MS: No edema; No deformity. Neuro:  Nonfocal  Psych: Normal affect   Labs    High Sensitivity Troponin:   Recent Labs  Lab 07/15/19 1928 07/15/19 2256  TROPONINIHS 21* 19*      Chemistry Recent Labs  Lab 07/15/19 1928 07/16/19 1921 07/17/19 0412 07/18/19 0445 07/20/19 2030  NA 144   < > 137 140 144  K 4.1   < > 4.6 4.3 5.1  CL 104   < > 102 105 112*  CO2 25   < > 28 27 26   GLUCOSE 82   < > 88 105* 98  BUN 21   < > 23 17 16   CREATININE 0.98   < > 1.01* 0.88 0.95  CALCIUM 7.6*   < > 7.6* 7.8* 7.7*  PROT 6.6  --   --  5.7* 6.1*  ALBUMIN 3.3*  --   --  2.5* 2.8*  AST 28  --   --  19 22  ALT 10  --   --  9 14  ALKPHOS 67  --   --  50 60  BILITOT 0.6  --   --  0.7 0.5  GFRNONAA 57*   < > 55* >60 59*  GFRAA >60   < > >60 >60 >60  ANIONGAP 15   < > 7 8 6    < > = values in this interval not displayed.     Hematology Recent Labs  Lab 07/17/19 0412 07/17/19 0412 07/18/19 0445 07/20/19 2005 07/20/19 2030  WBC 3.6*  --  3.3*  --  2.8*  RBC 3.30*   < > 3.25* 3.57* 3.61*  HGB 9.3*  --  9.0*  --  10.0*  HCT 30.9*  --  30.7*  --  34.1*  MCV 93.6  --  94.5  --  94.5  MCH 28.2  --  27.7  --  27.7  MCHC 30.1  --  29.3*  --  29.3*  RDW 15.5  --  15.3  --  15.0  PLT 156  --  149*  --  232   < > = values in this interval not displayed.    BNPNo results for input(s): BNP, PROBNP in the last 168 hours.   DDimer No results for input(s): DDIMER in the last 168 hours.   Radiology    DG CHEST PORT 1 VIEW  Result Date: 07/20/2019 CLINICAL DATA:  Fever EXAM: PORTABLE CHEST 1 VIEW  COMPARISON:  07/15/2019 FINDINGS: Right Port-A-Cath and left pacer remain in place, unchanged. Heart is normal size. Chronic nodular opacities within the lungs bilaterally, particularly upper lobes, unchanged. No effusions. No acute bony abnormality. IMPRESSION: Stable nodular opacities in the lungs, particularly upper lobes. This likely reflects previously seen mycobacterium avium infection changes. Electronically Signed   By: Rolm Baptise M.D.   On: 07/20/2019 20:39    Cardiac Studies        Assessment & Plan  Recurrent fevers  CRT-P implant w recent generator change/ persistent pocket fluid  NICM  PAF   TIA  MAI  Leukopenia     Not clear as to the source of the fevers nor the cause of the leukopenia-- are they the same?  Systemic/drug rxn?? Await ID input; not inclined towards incriminating the fluid in the pocket as the cause of ofevers of >103 The fact that her [facility] were neg is not so encouraging thinking about the drugs for her MAI   Holding off on anticoagulation given the pocket but does not seem to be actively bleeding so will think about resuming soon     For questions or updates, please contact [place] Please consult [email or web address] for contact info under        Signed, Virl Axe, MD  07/21/2019, 6:46 AM

## 2019-07-21 NOTE — Patient Outreach (Signed)
  [facility]) Care Management Chronic Special Needs Program    07/21/2019  Name: [name], DOB: 1945/11/30  MRN: [id number]   [name] is enrolled in a chronic special needs plan for Heart Failure. Client admitted to [facility] with fever Individualized care plan sent to [facility] for admission  [facility] will continue to follow. Communicated with [place] of clients admission  Peter Garter RN, [facility], [place] Management [phone]

## 2019-07-21 NOTE — Progress Notes (Signed)
PROGRESS NOTE    [name]  WFU:[id number] DOB: April 07, 1945 DOA: 07/20/2019 PCP: Ann Held, DO   Brief Narrative:  74 year old with history of chronic respiratory failure on 3 L nasal cannula, COPD, paroxysmal A. fib, pacemaker, CHF, recent MAI infection admitted for fevers as high as 103 at home.  Recently admitted here for pacemaker change complicated by hematoma underwent TEE without any evidence of infection.  But due to 1 known cause of fever she was discharged on Keflex for 10 days.  Infectious disease consulted   Assessment & Plan:   Principal Problem:   Acute febrile illness Active Problems:   Essential hypertension   Congestive dilated cardiomyopathy (HCC)   COPD with emphysema (HCC)   Chronic hypoxemic respiratory failure (HCC)   Pulmonary MAI (mycobacterium avium-intracellulare) infection (HCC)   Fever  Fever, unknown exact etiology Recently diagnosed MAI pneumonia -Seen by infectious disease previously.  We consulted this admission -Already on rifabutin, ethambutol and azithromycin for MAI.  Also on Keflex -LFTs normal, elevated inflammatory markers, still positive UA with previously grown Klebsiella sensitive to Keflex -Chest x-ray-nodular opacities -Repeat COVID-19-negative -CTA chest 4/29-negative for PE, showed severe bilateral pulmonary nodules  Pacemaker CRT [employer] History of paroxysmal atrial fibrillation -Recently changed generator on 4/2.  Complicated by hematoma therefore Eliquis stopped -TEE did not show evidence of infection but empirically went home on 10 days of Keflex  Urinary tract infection, Klebsiella; POA -Diagnosis on previous admission on Keflex.  Elevated TSH -Check free T4  Chronic diastolic congestive heart failure, EF 65% Nonischemic cardiomyopathy, nonobstructive CAD -Coreg, Lasix, Norvasc  Chronic hypoxic respiratory failure, 2 L nasal cannula History of COPD -As needed bronchodilators  Peripheral  neuropathy -Gabapentin 600 mg 4 times daily  GERD -PPI   DVT prophylaxis: SCDs Code Status: Full code Family Communication: None  Status is: Inpatient  Remains inpatient appropriate because:Ongoing diagnostic testing needed not appropriate for outpatient work up.  Still undergoing evaluation for recurrent fever   Dispo: The patient is from: Home              Anticipated d/c is to: Home              Anticipated d/c date is: 2 days              Patient currently is not medically stable to d/c.  Still undergoing evaluation for recurrent fevers.  Infectious disease team has been consulted for their input.  Consultants:   Cardiology  Infectious disease   Subjective: Patient does not have any complaints.  Tells me after going home she had intermittently continue to spike fever, as high as 103 degrees.  This morning she feels okay.  No new complaints.  Review of Systems Otherwise negative except as per HPI, including: General: Denies fever, chills, night sweats or unintended weight loss. Resp: Denies cough, wheezing, shortness of breath. Cardiac: Denies chest pain, palpitations, orthopnea, paroxysmal nocturnal dyspnea. GI: Denies abdominal pain, nausea, vomiting, diarrhea or constipation GU: Denies dysuria, frequency, hesitancy or incontinence MS: Denies muscle aches, joint pain or swelling Neuro: Denies headache, neurologic deficits (focal weakness, numbness, tingling), abnormal gait Psych: Denies anxiety, depression, SI/HI/AVH Skin: Denies new rashes or lesions ID: Denies sick contacts, exotic exposures, travel  Examination:  General exam: Appears calm and comfortable, elderly. Respiratory system: Clear to auscultation. Respiratory effort normal. Cardiovascular system: S1 & S2 heard, RRR. No JVD, murmurs, rubs, gallops or clicks. No pedal edema. Gastrointestinal system: Abdomen is nondistended, soft and nontender.  No organomegaly or masses felt. Normal bowel sounds  heard. Central nervous system: Alert and oriented. No focal neurological deficits. Extremities: Symmetric 5 x 5 power. Skin: No rashes, lesions or ulcers, recent surgical site noted in the right chest-some fluctuance but no erythema or drainage.  Slightly tender to palpation. Psychiatry: Judgement and insight appear normal. Mood & affect appropriate.   Objective: Vitals:   07/20/19 1845 07/20/19 2052 07/21/19 0042 07/21/19 0430  BP: (!) 168/75 133/72 (!) 118/56 118/70  Pulse: 79 74 73 73  Resp: 18 16 13 15   Temp: 98 F (36.7 C) 98.4 F (36.9 C) 98.1 F (36.7 C) 98 F (36.7 C)  TempSrc: Oral Oral Oral Oral  SpO2: 99% 99% 98% 99%  Weight:    41.2 kg    Intake/Output Summary (Last 24 hours) at 07/21/2019 0749 Last data filed at 07/21/2019 0500 Gross per 24 hour  Intake --  Output 325 ml  Net -325 ml   Filed Weights   07/21/19 0430  Weight: 41.2 kg     Data Reviewed:   CBC: Recent Labs  Lab 07/15/19 1928 07/16/19 1921 07/17/19 0412 07/18/19 0445 07/20/19 2030  WBC 4.7 4.0 3.6* 3.3* 2.8*  NEUTROABS 4.0  --   --   --  1.9  HGB 11.3* 9.1* 9.3* 9.0* 10.0*  HCT 37.7 30.4* 30.9* 30.7* 34.1*  MCV 94.5 93.5 93.6 94.5 94.5  PLT 163 154 156 149* 664   Basic Metabolic Panel: Recent Labs  Lab 07/15/19 1928 07/16/19 1921 07/17/19 0412 07/18/19 0445 07/20/19 2030  NA 144 137 137 140 144  K 4.1 4.5 4.6 4.3 5.1  CL 104 100 102 105 112*  CO2 25 29 28 27 26   GLUCOSE 82 115* 88 105* 98  BUN 21 24* 23 17 16   CREATININE 0.98 1.22* 1.01* 0.88 0.95  CALCIUM 7.6* 7.5* 7.6* 7.8* 7.7*  MG  --   --   --  2.0  --    GFR: Estimated Creatinine Clearance: 33.8 mL/min (by C-G formula based on SCr of 0.95 mg/dL). Liver Function Tests: Recent Labs  Lab 07/15/19 1928 07/18/19 0445 07/20/19 2030  AST 28 19 22   ALT 10 9 14   ALKPHOS 67 50 60  BILITOT 0.6 0.7 0.5  PROT 6.6 5.7* 6.1*  ALBUMIN 3.3* 2.5* 2.8*   No results for input(s): LIPASE, AMYLASE in the last 168 hours. No  results for input(s): AMMONIA in the last 168 hours. Coagulation Profile: Recent Labs  Lab 07/15/19 1958  INR 1.1   Cardiac Enzymes: No results for input(s): CKTOTAL, CKMB, CKMBINDEX, TROPONINI in the last 168 hours. BNP (last 3 results) No results for input(s): PROBNP in the last 8760 hours. HbA1C: No results for input(s): HGBA1C in the last 72 hours. CBG: No results for input(s): GLUCAP in the last 168 hours. Lipid Profile: No results for input(s): CHOL, HDL, LDLCALC, TRIG, CHOLHDL, LDLDIRECT in the last 72 hours. Thyroid Function Tests: Recent Labs    07/20/19 2005  TSH 7.708*   Anemia Panel: Recent Labs    07/20/19 2005 07/20/19 2030  VITAMINB12  --  128*  FOLATE 9.2  --   FERRITIN  --  1,432*  TIBC  --  181*  IRON  --  21*  RETICCTPCT 1.0  --    Sepsis Labs: Recent Labs  Lab 07/15/19 1928 07/15/19 2256  LATICACIDVEN 1.2 0.9    Recent Results (from the past 240 hour(s))  Blood Culture (routine x 2)  Status: None   Collection Time: 07/15/19  7:21 PM   Specimen: BLOOD RIGHT FOREARM  Result Value Ref Range Status   Specimen Description BLOOD RIGHT FOREARM  Final   Special Requests   Final    BOTTLES DRAWN AEROBIC ONLY Blood Culture adequate volume   Culture   Final    NO GROWTH 5 DAYS Performed at [facility], [address] [place], [place] [address]    Report Status 07/20/2019 FINAL  Final  Blood Culture (routine x 2)     Status: None   Collection Time: 07/15/19  7:32 PM   Specimen: BLOOD LEFT HAND  Result Value Ref Range Status   Specimen Description BLOOD LEFT HAND  Final   Special Requests   Final    BOTTLES DRAWN AEROBIC AND ANAEROBIC Blood Culture adequate volume   Culture   Final    NO GROWTH 5 DAYS Performed at [facility], [place] [address], [place], [place] [address]    Report Status 07/20/2019 FINAL  Final  Respiratory Panel by RT PCR (Flu A&B, Covid) - Nasopharyngeal Swab     Status: None   Collection Time: 07/15/19   7:55 PM   Specimen: Nasopharyngeal Swab  Result Value Ref Range Status   SARS Coronavirus 2 by RT PCR NEGATIVE NEGATIVE Final    Comment: (NOTE) SARS-CoV-2 target nucleic acids are NOT DETECTED. The SARS-CoV-2 RNA is generally detectable in upper respiratoy specimens during the acute phase of infection. The lowest concentration of SARS-CoV-2 viral copies this assay can detect is 131 copies/mL. A negative result does not preclude SARS-Cov-2 infection and should not be used as the sole basis for treatment or other patient management decisions. A negative result may occur with  improper specimen collection/handling, submission of specimen other than nasopharyngeal swab, presence of viral mutation(s) within the areas targeted by this assay, and inadequate number of viral copies (<131 copies/mL). A negative result must be combined with clinical observations, patient history, and epidemiological information. The expected result is Negative. Fact Sheet for Patients:  PinkCheek.be Fact Sheet for Healthcare Providers:  GravelBags.it This test is not yet ap proved or cleared by the [place] FDA and  has been authorized for detection and/or diagnosis of SARS-CoV-2 by FDA under an Emergency Use Authorization (EUA). This EUA will remain  in effect (meaning this test can be used) for the duration of the COVID-19 declaration under Section 564(b)(1) of the Act, 21 U.S.C. section 360bbb-3(b)(1), unless the authorization is terminated or revoked sooner.    Influenza A by PCR NEGATIVE NEGATIVE Final   Influenza B by PCR NEGATIVE NEGATIVE Final    Comment: (NOTE) The Xpert Xpress SARS-CoV-2/FLU/RSV assay is intended as an aid in  the diagnosis of influenza from Nasopharyngeal swab specimens and  should not be used as a sole basis for treatment. Nasal washings and  aspirates are unacceptable for Xpert Xpress SARS-CoV-2/FLU/RSV   testing. Fact Sheet for Patients: PinkCheek.be Fact Sheet for Healthcare Providers: GravelBags.it This test is not yet approved or cleared by the [place] FDA and  has been authorized for detection and/or diagnosis of SARS-CoV-2 by  FDA under an Emergency Use Authorization (EUA). This EUA will remain  in effect (meaning this test can be used) for the duration of the  Covid-19 declaration under Section 564(b)(1) of the Act, 21  U.S.C. section 360bbb-3(b)(1), unless the authorization is  terminated or revoked. Performed at [facility], [place] [address] [place], [place] [address]  Urine culture     Status: Abnormal   Collection Time: 07/16/19  3:12 PM   Specimen: In/Out Cath Urine  Result Value Ref Range Status   Specimen Description IN/OUT CATH URINE  Final   Special Requests   Final    NONE Performed at [facility], [address] [place], [place] [address]    Culture >=100,000 COLONIES/mL KLEBSIELLA PNEUMONIAE (A)  Final   Report Status 07/18/2019 FINAL  Final   Organism ID, Bacteria KLEBSIELLA PNEUMONIAE (A)  Final      Susceptibility   Klebsiella pneumoniae - MIC*    AMPICILLIN RESISTANT Resistant     CEFAZOLIN <=4 SENSITIVE Sensitive     CEFTRIAXONE <=1 SENSITIVE Sensitive     CIPROFLOXACIN <=0.25 SENSITIVE Sensitive     GENTAMICIN <=1 SENSITIVE Sensitive     IMIPENEM <=0.25 SENSITIVE Sensitive     NITROFURANTOIN 64 INTERMEDIATE Intermediate     TRIMETH/SULFA <=20 SENSITIVE Sensitive     AMPICILLIN/SULBACTAM 4 SENSITIVE Sensitive     PIP/TAZO <=4 SENSITIVE Sensitive     * >=100,000 COLONIES/mL KLEBSIELLA PNEUMONIAE  SARS CORONAVIRUS 2 (TAT 6-24 HRS) Nasopharyngeal Nasopharyngeal Swab     Status: None   Collection Time: 07/20/19  7:03 PM   Specimen: Nasopharyngeal Swab  Result Value Ref Range Status   SARS Coronavirus 2 NEGATIVE NEGATIVE Final    Comment: (NOTE) SARS-CoV-2 target nucleic  acids are NOT DETECTED. The SARS-CoV-2 RNA is generally detectable in upper and lower respiratory specimens during the acute phase of infection. Negative results do not preclude SARS-CoV-2 infection, do not rule out co-infections with other pathogens, and should not be used as the sole basis for treatment or other patient management decisions. Negative results must be combined with clinical observations, patient history, and epidemiological information. The expected result is Negative. Fact Sheet for Patients: SugarRoll.be Fact Sheet for Healthcare Providers: https://www.woods-mathews.com/ This test is not yet approved or cleared by the [place] FDA and  has been authorized for detection and/or diagnosis of SARS-CoV-2 by FDA under an Emergency Use Authorization (EUA). This EUA will remain  in effect (meaning this test can be used) for the duration of the COVID-19 declaration under Section 56 4(b)(1) of the Act, 21 U.S.C. section 360bbb-3(b)(1), unless the authorization is terminated or revoked sooner. Performed at [facility], [place] [address], [place], [place] [address]   Culture, blood (routine x 2)     Status: None (Preliminary result)   Collection Time: 07/20/19  8:13 PM   Specimen: BLOOD  Result Value Ref Range Status   Specimen Description BLOOD RIGHT ANTECUBITAL  Final   Special Requests   Final    BOTTLES DRAWN AEROBIC AND ANAEROBIC Blood Culture results may not be optimal due to an excessive volume of blood received in culture bottles   Culture   Final    NO GROWTH < 12 HOURS Performed at [facility], [place] [address], [place], [place] [address]    Report Status PENDING  Incomplete  Culture, blood (routine x 2)     Status: None (Preliminary result)   Collection Time: 07/20/19  9:36 PM   Specimen: BLOOD RIGHT ARM  Result Value Ref Range Status   Specimen Description BLOOD RIGHT ARM  Final   Special Requests   Final     BOTTLES DRAWN AEROBIC AND ANAEROBIC Blood Culture adequate volume   Culture   Final    NO GROWTH < 12 HOURS Performed at [facility],  [address] [place], [place] [address]    Report Status PENDING  Incomplete         Radiology Studies: DG CHEST PORT 1 VIEW  Result Date: 07/20/2019 CLINICAL DATA:  Fever EXAM: PORTABLE CHEST 1 VIEW COMPARISON:  07/15/2019 FINDINGS: Right Port-A-Cath and left pacer remain in place, unchanged. Heart is normal size. Chronic nodular opacities within the lungs bilaterally, particularly upper lobes, unchanged. No effusions. No acute bony abnormality. IMPRESSION: Stable nodular opacities in the lungs, particularly upper lobes. This likely reflects previously seen mycobacterium avium infection changes. Electronically Signed   By: Rolm Baptise M.D.   On: 07/20/2019 20:39        Scheduled Meds: . amLODipine  2.5 mg Oral QHS  . atorvastatin  20 mg Oral QPM  . azithromycin  500 mg Oral Q T,Th,Sat-1800  . carvedilol  25 mg Oral QHS  . cephALEXin  500 mg Oral QID  . Chlorhexidine Gluconate Cloth  6 each Topical Daily  . [START ON 07/22/2019] ethambutol  1,000 mg Oral Q M,W,F  . furosemide  40 mg Oral q AM  . gabapentin  600 mg Oral QID  . pantoprazole  40 mg Oral BID  . [START ON 07/22/2019] rifabutin  300 mg Oral Q M,W,F  . sodium chloride flush  10-40 mL Intracatheter Q12H  . umeclidinium bromide  1 puff Inhalation Daily  . zolpidem  5 mg Oral QHS   Continuous Infusions:   LOS: 1 day   Time spent= 35 mins    Jet Armbrust Arsenio Loader, MD [facility]  If 7PM-7AM, please contact night-coverage  07/21/2019, 7:49 AM

## 2019-07-22 ENCOUNTER — Ambulatory Visit: Payer: HMO | Admitting: Internal Medicine

## 2019-07-22 DIAGNOSIS — R509 Fever, unspecified: Secondary | ICD-10-CM | POA: Diagnosis not present

## 2019-07-22 DIAGNOSIS — T82837D Hemorrhage of cardiac prosthetic devices, implants and grafts, subsequent encounter: Secondary | ICD-10-CM | POA: Diagnosis not present

## 2019-07-22 DIAGNOSIS — A310 Pulmonary mycobacterial infection: Secondary | ICD-10-CM | POA: Diagnosis not present

## 2019-07-22 LAB — CBC
HCT: 32.3 % — ABNORMAL LOW (ref 36.0–46.0)
Hemoglobin: 9.9 g/dL — ABNORMAL LOW (ref 12.0–15.0)
MCH: 28.1 pg (ref 26.0–34.0)
MCHC: 30.7 g/dL (ref 30.0–36.0)
MCV: 91.8 fL (ref 80.0–100.0)
Platelets: 245 10*3/uL (ref 150–400)
RBC: 3.52 MIL/uL — ABNORMAL LOW (ref 3.87–5.11)
RDW: 14.6 % (ref 11.5–15.5)
WBC: 5.2 10*3/uL (ref 4.0–10.5)
nRBC: 0 % (ref 0.0–0.2)

## 2019-07-22 LAB — BASIC METABOLIC PANEL
Anion gap: 6 (ref 5–15)
BUN: 10 mg/dL (ref 8–23)
CO2: 29 mmol/L (ref 22–32)
Calcium: 8 mg/dL — ABNORMAL LOW (ref 8.9–10.3)
Chloride: 102 mmol/L (ref 98–111)
Creatinine, Ser: 0.76 mg/dL (ref 0.44–1.00)
GFR calc Af Amer: >60 mL/min (ref >60)
GFR calc non Af Amer: >60 mL/min (ref >60)
Glucose, Bld: 101 mg/dL — ABNORMAL HIGH (ref 70–99)
Potassium: 4.5 mmol/L (ref 3.5–5.1)
Sodium: 137 mmol/L (ref 135–145)

## 2019-07-22 LAB — MAGNESIUM: Magnesium: 2 mg/dL (ref 1.7–2.4)

## 2019-07-22 NOTE — Progress Notes (Signed)
PROGRESS NOTE    [name]  DGL:[id number] DOB: 1945/07/25 DOA: 07/20/2019 PCP: Ann Held, DO   Brief Narrative:  74 year old with history of chronic respiratory failure on 3 L nasal cannula, COPD, paroxysmal A. fib, pacemaker, CHF, recent MAI infection admitted for fevers as high as 103 at home.  Recently admitted here for pacemaker change complicated by hematoma underwent TEE without any evidence of infection.  But due to 1 known cause of fever she was discharged on Keflex for 10 days.  Infectious disease consulted   Assessment & Plan:   Principal Problem:   Acute febrile illness Active Problems:   Essential hypertension   Congestive dilated cardiomyopathy (HCC)   COPD with emphysema ([place])   Chronic hypoxemic respiratory failure (HCC)   Pulmonary MAI (mycobacterium avium-intracellulare) infection (HCC)   Fever   Other neutropenia (HCC)   Fever, unknown etiology, presumed UTI (POA) with questionable pacemaker pocket infection Recently diagnosed MAI pneumonia - ID continues to follow - appreciate insight and recommendations - continue holding MACs per ID (previously on rifabutin, ethambutol and azithromycin) through 07/29/19. - Keflex to complete today for UTI - LFTs normal, elevated inflammatory markers, still positive UA with previously grown Klebsiella sensitive to Keflex (completed therapy 07/22/19) - Chest x-ray-nodular opacities - Repeat COVID-19-negative - CTA chest 4/29-negative for PE, showed several bilateral pulmonary nodules - Leukopenia worsening - monitor overnight  Pacemaker CRT [employer] History of paroxysmal atrial fibrillation - Recently changed generator on 4/2.  Complicated by hematoma therefore Eliquis stopped - TEE did not show evidence of infection but empirically went home on 10 days of Keflex  Urinary tract infection, Klebsiella; POA - Diagnosis on previous admission on Keflex.  Elevated TSH - TSH 7.7 - Free T4: 0.68  Chronic  diastolic congestive heart failure, EF 65% Nonischemic cardiomyopathy, nonobstructive CAD -Coreg, Lasix, Norvasc  Chronic hypoxic respiratory failure, 3L nasal cannula History of COPD -As needed bronchodilators  Peripheral neuropathy -Gabapentin 600 mg 4 times daily  GERD -PPI  DVT prophylaxis: SCDs Code Status: Full code Family Communication: None  Status is: Inpatient  Remains inpatient appropriate because:Ongoing diagnostic testing needed not appropriate for outpatient work up.  Still undergoing evaluation for recurrent fever  Dispo: The patient is from: Home              Anticipated d/c is to: Home               Anticipated d/c date is: 1 day              Patient currently is not medically stable to d/c.  Still undergoing evaluation for recurrent fevers, possible source for infection and close monitoring  Consultants:   Cardiology  Infectious disease   Subjective: No acute issues/events overnight - continues to complain of pain in her L axilla worse with ROM of left arm. Denies fevers/chills/chest pain.  Examination:  General exam: Appears calm and comfortable, elderly. Respiratory system: Clear to auscultation. Respiratory effort normal. Cardiovascular system: S1 & S2 heard, RRR. No JVD, murmurs, rubs, gallops or clicks. No pedal edema. Gastrointestinal system: Abdomen is nondistended, soft and nontender. No organomegaly or masses felt. Normal bowel sounds heard. Central nervous system: Alert and oriented. No focal neurological deficits. Extremities: Symmetric 5 x 5 power. Skin: No rashes, lesions or ulcers, recent surgical site noted in the right chest-some fluctuance but no erythema or drainage.  Moderately tender to palpation. Psychiatry: Judgement and insight appear normal. Mood & affect appropriate.   Objective: Vitals:  07/21/19 1333 07/21/19 1424 07/21/19 1645 07/21/19 2023  BP:  125/90  134/77  Pulse:  73  82  Resp: 14 14 15 17   Temp:  98.2 F (36.8  C)  98.8 F (37.1 C)  TempSrc:  Oral  Oral  SpO2:  99%  99%  Weight:        Intake/Output Summary (Last 24 hours) at 07/22/2019 0733 Last data filed at 07/21/2019 1509 Gross per 24 hour  Intake 444 ml  Output 1450 ml  Net -1006 ml   Filed Weights   07/21/19 0430  Weight: 41.2 kg     Data Reviewed:   CBC: Recent Labs  Lab 07/15/19 1928 07/16/19 1921 07/17/19 0412 07/18/19 0445 07/20/19 2030  WBC 4.7 4.0 3.6* 3.3* 2.8*  NEUTROABS 4.0  --   --   --  1.9  HGB 11.3* 9.1* 9.3* 9.0* 10.0*  HCT 37.7 30.4* 30.9* 30.7* 34.1*  MCV 94.5 93.5 93.6 94.5 94.5  PLT 163 154 156 149* 676   Basic Metabolic Panel: Recent Labs  Lab 07/15/19 1928 07/16/19 1921 07/17/19 0412 07/18/19 0445 07/20/19 2030  NA 144 137 137 140 144  K 4.1 4.5 4.6 4.3 5.1  CL 104 100 102 105 112*  CO2 25 29 28 27 26   GLUCOSE 82 115* 88 105* 98  BUN 21 24* 23 17 16   CREATININE 0.98 1.22* 1.01* 0.88 0.95  CALCIUM 7.6* 7.5* 7.6* 7.8* 7.7*  MG  --   --   --  2.0  --    GFR: Estimated Creatinine Clearance: 33.8 mL/min (by C-G formula based on SCr of 0.95 mg/dL). Liver Function Tests: Recent Labs  Lab 07/15/19 1928 07/18/19 0445 07/20/19 2030  AST 28 19 22   ALT 10 9 14   ALKPHOS 67 50 60  BILITOT 0.6 0.7 0.5  PROT 6.6 5.7* 6.1*  ALBUMIN 3.3* 2.5* 2.8*   No results for input(s): LIPASE, AMYLASE in the last 168 hours. No results for input(s): AMMONIA in the last 168 hours. Coagulation Profile: Recent Labs  Lab 07/15/19 1958  INR 1.1   Cardiac Enzymes: No results for input(s): CKTOTAL, CKMB, CKMBINDEX, TROPONINI in the last 168 hours. BNP (last 3 results) No results for input(s): PROBNP in the last 8760 hours. HbA1C: No results for input(s): HGBA1C in the last 72 hours. CBG: No results for input(s): GLUCAP in the last 168 hours. Lipid Profile: No results for input(s): CHOL, HDL, LDLCALC, TRIG, CHOLHDL, LDLDIRECT in the last 72 hours. Thyroid Function Tests: Recent Labs    07/20/19 2005  07/20/19 2030  TSH 7.708*  --   FREET4  --  0.68   Anemia Panel: Recent Labs    07/20/19 2005 07/20/19 2030  VITAMINB12  --  128*  FOLATE 9.2  --   FERRITIN  --  1,432*  TIBC  --  181*  IRON  --  21*  RETICCTPCT 1.0  --    Sepsis Labs: Recent Labs  Lab 07/15/19 1928 07/15/19 2256  LATICACIDVEN 1.2 0.9    Recent Results (from the past 240 hour(s))  Blood Culture (routine x 2)     Status: None   Collection Time: 07/15/19  7:21 PM   Specimen: BLOOD RIGHT FOREARM  Result Value Ref Range Status   Specimen Description BLOOD RIGHT FOREARM  Final   Special Requests   Final    BOTTLES DRAWN AEROBIC ONLY Blood Culture adequate volume   Culture   Final    NO GROWTH 5 DAYS Performed  at [facility], [place] [address] [place], [place] [address]    Report Status 07/20/2019 FINAL  Final  Blood Culture (routine x 2)     Status: None   Collection Time: 07/15/19  7:32 PM   Specimen: BLOOD LEFT HAND  Result Value Ref Range Status   Specimen Description BLOOD LEFT HAND  Final   Special Requests   Final    BOTTLES DRAWN AEROBIC AND ANAEROBIC Blood Culture adequate volume   Culture   Final    NO GROWTH 5 DAYS Performed at [facility], [place] [address] [place], [place] [address]    Report Status 07/20/2019 FINAL  Final  Respiratory Panel by RT PCR (Flu A&B, Covid) - Nasopharyngeal Swab     Status: None   Collection Time: 07/15/19  7:55 PM   Specimen: Nasopharyngeal Swab  Result Value Ref Range Status   SARS Coronavirus 2 by RT PCR NEGATIVE NEGATIVE Final    Comment: (NOTE) SARS-CoV-2 target nucleic acids are NOT DETECTED. The SARS-CoV-2 RNA is generally detectable in upper respiratoy specimens during the acute phase of infection. The lowest concentration of SARS-CoV-2 viral copies this assay can detect is 131 copies/mL. A negative result does not preclude SARS-Cov-2 infection and should not be used as the sole basis for treatment or other patient management decisions.  A negative result may occur with  improper specimen collection/handling, submission of specimen other than nasopharyngeal swab, presence of viral mutation(s) within the areas targeted by this assay, and inadequate number of viral copies (<131 copies/mL). A negative result must be combined with clinical observations, patient history, and epidemiological information. The expected result is Negative. Fact Sheet for Patients:  PinkCheek.be Fact Sheet for Healthcare Providers:  GravelBags.it This test is not yet ap proved or cleared by the [place] FDA and  has been authorized for detection and/or diagnosis of SARS-CoV-2 by FDA under an Emergency Use Authorization (EUA). This EUA will remain  in effect (meaning this test can be used) for the duration of the COVID-19 declaration under Section 564(b)(1) of the Act, 21 U.S.C. section 360bbb-3(b)(1), unless the authorization is terminated or revoked sooner.    Influenza A by PCR NEGATIVE NEGATIVE Final   Influenza B by PCR NEGATIVE NEGATIVE Final    Comment: (NOTE) The Xpert Xpress SARS-CoV-2/FLU/RSV assay is intended as an aid in  the diagnosis of influenza from Nasopharyngeal swab specimens and  should not be used as a sole basis for treatment. Nasal washings and  aspirates are unacceptable for Xpert Xpress SARS-CoV-2/FLU/RSV  testing. Fact Sheet for Patients: PinkCheek.be Fact Sheet for Healthcare Providers: GravelBags.it This test is not yet approved or cleared by the [place] FDA and  has been authorized for detection and/or diagnosis of SARS-CoV-2 by  FDA under an Emergency Use Authorization (EUA). This EUA will remain  in effect (meaning this test can be used) for the duration of the  Covid-19 declaration under Section 564(b)(1) of the Act, 21  U.S.C. section 360bbb-3(b)(1), unless the authorization is    terminated or revoked. Performed at [facility], [place] [address] [place],  [address]   Urine culture     Status: Abnormal   Collection Time: 07/16/19  3:12 PM   Specimen: In/Out Cath Urine  Result Value Ref Range Status   Specimen Description IN/OUT CATH URINE  Final   Special Requests   Final    NONE Performed at [facility], [place] [address] [place], [place]  [address]    Culture >=100,000 COLONIES/mL KLEBSIELLA PNEUMONIAE (A)  Final   Report Status 07/18/2019 FINAL  Final   Organism ID, Bacteria KLEBSIELLA PNEUMONIAE (A)  Final      Susceptibility   Klebsiella pneumoniae - MIC*    AMPICILLIN RESISTANT Resistant     CEFAZOLIN <=4 SENSITIVE Sensitive     CEFTRIAXONE <=1 SENSITIVE Sensitive     CIPROFLOXACIN <=0.25 SENSITIVE Sensitive     GENTAMICIN <=1 SENSITIVE Sensitive     IMIPENEM <=0.25 SENSITIVE Sensitive     NITROFURANTOIN 64 INTERMEDIATE Intermediate     TRIMETH/SULFA <=20 SENSITIVE Sensitive     AMPICILLIN/SULBACTAM 4 SENSITIVE Sensitive     PIP/TAZO <=4 SENSITIVE Sensitive     * >=100,000 COLONIES/mL KLEBSIELLA PNEUMONIAE  SARS CORONAVIRUS 2 (TAT 6-24 HRS) Nasopharyngeal Nasopharyngeal Swab     Status: None   Collection Time: 07/20/19  7:03 PM   Specimen: Nasopharyngeal Swab  Result Value Ref Range Status   SARS Coronavirus 2 NEGATIVE NEGATIVE Final    Comment: (NOTE) SARS-CoV-2 target nucleic acids are NOT DETECTED. The SARS-CoV-2 RNA is generally detectable in upper and lower respiratory specimens during the acute phase of infection. Negative results do not preclude SARS-CoV-2 infection, do not rule out co-infections with other pathogens, and should not be used as the sole basis for treatment or other patient management decisions. Negative results must be combined with clinical observations, patient history, and epidemiological information. The expected result is Negative. Fact Sheet for  Patients: SugarRoll.be Fact Sheet for Healthcare Providers: https://www.woods-mathews.com/ This test is not yet approved or cleared by the [place] FDA and  has been authorized for detection and/or diagnosis of SARS-CoV-2 by FDA under an Emergency Use Authorization (EUA). This EUA will remain  in effect (meaning this test can be used) for the duration of the COVID-19 declaration under Section 56 4(b)(1) of the Act, 21 U.S.C. section 360bbb-3(b)(1), unless the authorization is terminated or revoked sooner. Performed at [facility], [place] [address], [place], [place] [address]   Culture, blood (routine x 2)     Status: None (Preliminary result)   Collection Time: 07/20/19  8:13 PM   Specimen: BLOOD  Result Value Ref Range Status   Specimen Description BLOOD RIGHT ANTECUBITAL  Final   Special Requests   Final    BOTTLES DRAWN AEROBIC AND ANAEROBIC Blood Culture results may not be optimal due to an excessive volume of blood received in culture bottles   Culture   Final    NO GROWTH 2 DAYS Performed at [facility], [place] [address] [place], [place] [address]    Report Status PENDING  Incomplete  Culture, blood (routine x 2)     Status: None (Preliminary result)   Collection Time: 07/20/19  9:36 PM   Specimen: BLOOD RIGHT ARM  Result Value Ref Range Status   Specimen Description BLOOD RIGHT ARM  Final   Special Requests   Final    BOTTLES DRAWN AEROBIC AND ANAEROBIC Blood Culture adequate volume   Culture   Final    NO GROWTH 2 DAYS Performed at [facility], [place] [address], [place], [place] [address]    Report Status PENDING  Incomplete    Radiology Studies: DG CHEST PORT 1 VIEW  Result Date: 07/20/2019 CLINICAL DATA:  Fever EXAM: PORTABLE CHEST 1 VIEW COMPARISON:  07/15/2019 FINDINGS: Right Port-A-Cath and left pacer remain in place, unchanged. Heart is normal size. Chronic nodular opacities within the lungs  bilaterally,  particularly upper lobes, unchanged. No effusions. No acute bony abnormality. IMPRESSION: Stable nodular opacities in the lungs, particularly upper lobes. This likely reflects previously seen mycobacterium avium infection changes. Electronically Signed   By: Rolm Baptise M.D.   On: 07/20/2019 20:39   Scheduled Meds: . amLODipine  2.5 mg Oral QHS  . atorvastatin  20 mg Oral QPM  . carvedilol  25 mg Oral QHS  . cephALEXin  500 mg Oral QID  . Chlorhexidine Gluconate Cloth  6 each Topical Daily  . furosemide  40 mg Oral q AM  . gabapentin  600 mg Oral QID  . oxyCODONE  5 mg Oral Q4H  . pantoprazole  40 mg Oral BID  . rifabutin  300 mg Oral Q M,W,F  . sodium chloride flush  10-40 mL Intracatheter Q12H  . umeclidinium bromide  1 puff Inhalation Daily  . zolpidem  5 mg Oral QHS   Continuous Infusions:   LOS: 2 days   Time spent= 35 mins  Little Ishikawa, DO [facility]  If 7PM-7AM, please contact night-coverage  07/22/2019, 7:33 AM

## 2019-07-22 NOTE — Progress Notes (Signed)
[facility]  Date of Admission:  07/20/2019     Total days of antibiotics 3         ASSESSMENT:  [name] continues to remain afebrile and clinically stable with concern for pacemaker pocket infection. Pulmonary MAC medications temporarily stopped to evaluate as cause of thrombocytopenia. Blood cultures from 5/3 without growth to date. Most recent TEE on 4/30 was without significant findings. [name] has been afebrile since being here in the hospital. Will discuss with Dr. Caryl Comes as to recommended next steps for evaluation. Will continue current dose of Keflex for now pending further evaluation.   PLAN:  1. Continue Keflex. 2. Consider additional interventions with cardiology if indicated.  3. Monitor fever curve and culture results.   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 amLODipine  2.5 mg Oral QHS  . atorvastatin  20 mg Oral QPM  . carvedilol  25 mg Oral QHS  . cephALEXin  500 mg Oral QID  . Chlorhexidine Gluconate Cloth  6 each Topical Daily  . furosemide  40 mg Oral q AM  . gabapentin  600 mg Oral QID  . oxyCODONE  5 mg Oral Q4H  . pantoprazole  40 mg Oral BID  . sodium chloride flush  10-40 mL Intracatheter Q12H  . umeclidinium bromide  1 puff Inhalation Daily  . zolpidem  5 mg Oral QHS    SUBJECTIVE:  Feeling about the same today. Afebrile overnight with no acute events. Having discomfort around lateral aspect of site and axilla. Ready to go home when able.   Allergies  Allergen Reactions  . Dabigatran Etexilate Mesylate Other (See Comments)    INTERNAL BLEEDING- Pradaxa  . Augmentin [Amoxicillin-Pot Clavulanate] Diarrhea and Nausea Only    Did it involve  swelling of the face/tongue/throat, SOB, or low BP? No Did it involve sudden or severe rash/hives, skin peeling, or any reaction on the inside of your mouth or nose? No Did you need to seek medical attention at a hospital or doctor's office? No When did it last happen?02/2019 If all above answers are "NO", may proceed with cephalosporin use.   [name] [Pentazocine] Other (See Comments)    Hallucinations      Review of Systems: Review of Systems  Constitutional: Negative for chills, fever and weight loss.  Respiratory: Negative for cough, shortness of breath and wheezing.   Cardiovascular: Negative for chest pain and leg swelling.  Gastrointestinal: Negative for abdominal pain, constipation, diarrhea, nausea and vomiting.  Skin: Negative for rash.      OBJECTIVE: Vitals:   07/22/19 0650 07/22/19 0754 07/22/19 1200 07/22/19 1352  BP: (!) 141/86 127/71    Pulse: 76     Resp: _0 Temp: 98.2 F (36.8 C)     TempSrc: Oral     SpO2: 100%     Weight: 40.5 kg      Body mass index is 17.44 kg/m.  Physical Exam Constitutional:      General: She is not in acute distress.    Appearance: She is well-developed.  Cardiovascular:     Rate and Rhythm: Normal rate and regular rhythm.     Heart sounds: Normal heart sounds.  Comments: Tenderness along lateral aspect of inserted pacer and left axilla. No drainage.  Pulmonary:     Effort: Pulmonary effort is normal.     Breath sounds: Normal breath sounds.  Skin:    General: Skin is warm and dry.  Neurological:     Mental Status: She is alert and oriented to person, place, and time.  Psychiatric:        Mood and Affect: Mood normal.        Behavior: Behavior normal.        Thought Content: Thought content normal.        Judgment: Judgment normal.     Lab Results Lab Results  Component Value Date   WBC 5.2 07/22/2019   HGB 9.9 (L) 07/22/2019   HCT 32.3 (L) 07/22/2019   MCV 91.8 07/22/2019   PLT 245 07/22/2019     Lab Results  Component Value Date   CREATININE 0.76 07/22/2019   BUN 10 07/22/2019   NA 137 07/22/2019   K 4.5 07/22/2019   CL 102 07/22/2019   CO2 29 07/22/2019    Lab Results  Component Value Date   ALT 14 07/20/2019   AST 22 07/20/2019   ALKPHOS 60 07/20/2019   BILITOT 0.5 07/20/2019     Microbiology: Recent Results (from the past 240 hour(s))  Blood Culture (routine x 2)     Status: None   Collection Time: 07/15/19  7:21 PM   Specimen: BLOOD RIGHT FOREARM  Result Value Ref Range Status   Specimen Description BLOOD RIGHT FOREARM  Final   Special Requests   Final    BOTTLES DRAWN AEROBIC ONLY Blood Culture adequate volume   Culture   Final    NO GROWTH 5 DAYS Performed at [facility], [address], [place], [place] [address]    Report Status 07/20/2019 FINAL  Final  Blood Culture (routine x 2)     Status: None   Collection Time: 07/15/19  7:32 PM   Specimen: BLOOD LEFT HAND  Result Value Ref Range Status   Specimen Description BLOOD LEFT HAND  Final   Special Requests   Final    BOTTLES DRAWN AEROBIC AND ANAEROBIC Blood Culture adequate volume   Culture   Final    NO GROWTH 5 DAYS Performed at [facility], [place] [address], [place], [place] [address]    Report Status 07/20/2019 FINAL  Final  Respiratory Panel by RT PCR (Flu A&B, Covid) - Nasopharyngeal Swab     Status: None   Collection Time: 07/15/19  7:55 PM   Specimen: Nasopharyngeal Swab  Result Value Ref Range Status   SARS Coronavirus 2 by RT PCR NEGATIVE NEGATIVE Final    Comment: (NOTE) SARS-CoV-2 target nucleic acids are NOT DETECTED. The SARS-CoV-2 RNA is generally detectable in upper respiratoy specimens during the acute phase of infection. The lowest concentration of SARS-CoV-2 viral copies this assay can detect is 131 copies/mL. A negative result does not preclude SARS-Cov-2 infection and should not be used as the sole basis for treatment or other patient management decisions. A  negative result may occur with  improper specimen collection/handling, submission of specimen other than nasopharyngeal swab, presence of viral mutation(s) within the areas targeted by this assay, and inadequate number of viral copies (<131 copies/mL). A negative result must be combined with clinical observations, patient history, and epidemiological information. The expected result is Negative. Fact Sheet for Patients:  PinkCheek.be Fact Sheet for Healthcare Providers:  GravelBags.it  This test is not yet ap proved or cleared by the [place] and  has been authorized for detection and/or diagnosis of SARS-CoV-2 by FDA under an Emergency Use Authorization (EUA). This EUA will remain  in effect (meaning this test can be used) for the duration of the COVID-19 declaration under Section 564(b)(1) of the Act, 21 U.S.C. section 360bbb-3(b)(1), unless the authorization is terminated or revoked sooner.    Influenza A by PCR NEGATIVE NEGATIVE Final   Influenza B by PCR NEGATIVE NEGATIVE Final    Comment: (NOTE) The Xpert Xpress SARS-CoV-2/FLU/RSV assay is intended as an aid in  the diagnosis of influenza from Nasopharyngeal swab specimens and  should not be used as a sole basis for treatment. Nasal washings and  aspirates are unacceptable for Xpert Xpress SARS-CoV-2/FLU/RSV  testing. Fact Sheet for Patients: PinkCheek.be Fact Sheet for Healthcare Providers: GravelBags.it This test is not yet approved or cleared by the [place] FDA and  has been authorized for detection and/or diagnosis of SARS-CoV-2 by  FDA under an Emergency Use Authorization (EUA). This EUA will remain  in effect (meaning this test can be used) for the duration of the  Covid-19 declaration under Section 564(b)(1) of the Act, 21  U.S.C. section 360bbb-3(b)(1), unless the authorization is    terminated or revoked. Performed at [facility], [place] [address] [place], [place] [address]   Urine culture     Status: Abnormal   Collection Time: 07/16/19  3:12 PM   Specimen: In/Out Cath Urine  Result Value Ref Range Status   Specimen Description IN/OUT CATH URINE  Final   Special Requests   Final    NONE Performed at [facility], [place] [address], [place], [place] [address]    Culture >=100,000 COLONIES/mL KLEBSIELLA PNEUMONIAE (A)  Final   Report Status 07/18/2019 FINAL  Final   Organism ID, Bacteria KLEBSIELLA PNEUMONIAE (A)  Final      Susceptibility   Klebsiella pneumoniae - MIC*    AMPICILLIN RESISTANT Resistant     CEFAZOLIN <=4 SENSITIVE Sensitive     CEFTRIAXONE <=1 SENSITIVE Sensitive     CIPROFLOXACIN <=0.25 SENSITIVE Sensitive     GENTAMICIN <=1 SENSITIVE Sensitive     IMIPENEM <=0.25 SENSITIVE Sensitive     NITROFURANTOIN 64 INTERMEDIATE Intermediate     TRIMETH/SULFA <=20 SENSITIVE Sensitive     AMPICILLIN/SULBACTAM 4 SENSITIVE Sensitive     PIP/TAZO <=4 SENSITIVE Sensitive     * >=100,000 COLONIES/mL KLEBSIELLA PNEUMONIAE  SARS CORONAVIRUS 2 (TAT 6-24 HRS) Nasopharyngeal Nasopharyngeal Swab     Status: None   Collection Time: 07/20/19  7:03 PM   Specimen: Nasopharyngeal Swab  Result Value Ref Range Status   SARS Coronavirus 2 NEGATIVE NEGATIVE Final    Comment: (NOTE) SARS-CoV-2 target nucleic acids are NOT DETECTED. The SARS-CoV-2 RNA is generally detectable in upper and lower respiratory specimens during the acute phase of infection. Negative results do not preclude SARS-CoV-2 infection, do not rule out co-infections with other pathogens, and should not be used as the sole basis for treatment or other patient management decisions. Negative results must be combined with clinical observations, patient history, and epidemiological information. The expected result is Negative. Fact Sheet for  Patients: SugarRoll.be Fact Sheet for Healthcare Providers: https://www.woods-mathews.com/ This test is not yet approved or cleared by the [place] FDA and  has been authorized for detection and/or diagnosis of SARS-CoV-2 by FDA under an Emergency Use Authorization (EUA). This EUA will remain  in effect (meaning this test can be used) for the duration of the COVID-19 declaration under Section 56 4(b)(1) of the Act, 21 U.S.C. section 360bbb-3(b)(1), unless the authorization is terminated or revoked sooner. Performed at [facility], [place] [address], [place], [place] [address]   Culture, blood (routine x 2)     Status: None (Preliminary result)   Collection Time: 07/20/19  8:13 PM   Specimen: BLOOD  Result Value Ref Range Status   Specimen Description BLOOD RIGHT ANTECUBITAL  Final   Special Requests   Final    BOTTLES DRAWN AEROBIC AND ANAEROBIC Blood Culture results may not be optimal due to an excessive volume of blood received in culture bottles   Culture   Final    NO GROWTH 2 DAYS Performed at [facility], [place] [address] [place], [place] [address]    Report Status PENDING  Incomplete  Culture, blood (routine x 2)     Status: None (Preliminary result)   Collection Time: 07/20/19  9:36 PM   Specimen: BLOOD RIGHT ARM  Result Value Ref Range Status   Specimen Description BLOOD RIGHT ARM  Final   Special Requests   Final    BOTTLES DRAWN AEROBIC AND ANAEROBIC Blood Culture adequate volume   Culture   Final    NO GROWTH 2 DAYS Performed at [facility], [place] [address], [place], [place] [address]    Report Status PENDING  Incomplete     Terri Piedra, [facility]  07/22/2019  2:52 PM

## 2019-07-22 NOTE — Progress Notes (Addendum)
.   Progress Note  Patient Name: [name] Date of Encounter: 07/22/2019  Primary Cardiologist: Minus Breeding, MD   Subjective   No CP, mild tenderness at site and towards axilla  Inpatient Medications    Scheduled Meds: . amLODipine  2.5 mg Oral QHS  . atorvastatin  20 mg Oral QPM  . carvedilol  25 mg Oral QHS  . cephALEXin  500 mg Oral QID  . Chlorhexidine Gluconate Cloth  6 each Topical Daily  . furosemide  40 mg Oral q AM  . gabapentin  600 mg Oral QID  . oxyCODONE  5 mg Oral Q4H  . pantoprazole  40 mg Oral BID  . rifabutin  300 mg Oral Q M,W,F  . sodium chloride flush  10-40 mL Intracatheter Q12H  . umeclidinium bromide  1 puff Inhalation Daily  . zolpidem  5 mg Oral QHS   Continuous Infusions:  PRN Meds: acetaminophen, ipratropium-albuterol, nitroGLYCERIN, ondansetron **OR** ondansetron (ZOFRAN) IV, oxyCODONE-acetaminophen **AND** oxyCODONE, polyethylene glycol, sodium chloride flush, tiZANidine   Vital Signs    Vitals:   07/21/19 1645 07/21/19 2023 07/22/19 0650 07/22/19 0754  BP:  134/77 (!) 141/86 127/71  Pulse:  82 76   Resp: 15 17 14 17   Temp:  98.8 F (37.1 C) 98.2 F (36.8 C)   TempSrc:  Oral Oral   SpO2:  99% 100%   Weight:   40.5 kg     Intake/Output Summary (Last 24 hours) at 07/22/2019 0805 Last data filed at 07/22/2019 0630 Gross per 24 hour  Intake 444 ml  Output 2000 ml  Net -1556 ml   Last 3 Weights 07/22/2019 07/21/2019 07/18/2019  Weight (lbs) 89 lb 4.6 oz 90 lb 13.3 oz 87 lb  Weight (kg) 40.5 kg 41.2 kg 39.463 kg      Telemetry    SR/V pacing - Personally Reviewed  ECG    No new EKGs - Personally Reviewed  Physical Exam   GEN: No acute distress.   Neck: No JVD Cardiac: RRR, no murmurs, rubs, or gallops.  Respiratory: Clear to auscultation bilaterally. GI: Soft, nontender, non-distended  MS: No edema; No deformity. Neuro:  Nonfocal  Psych: Normal affect  Tenderness in the axilla, LN not appreciated nor erythema  ICD  site: remains swollen, unchanged from last visit, dark skin discoloration of older ecchymosis.  No erythema, heat  Labs    High Sensitivity Troponin:   Recent Labs  Lab 07/15/19 1928 07/15/19 2256  TROPONINIHS 21* 19*      Chemistry Recent Labs  Lab 07/15/19 1928 07/16/19 1921 07/17/19 0412 07/18/19 0445 07/20/19 2030  NA 144   < > 137 140 144  K 4.1   < > 4.6 4.3 5.1  CL 104   < > 102 105 112*  CO2 25   < > 28 27 26   GLUCOSE 82   < > 88 105* 98  BUN 21   < > 23 17 16   CREATININE 0.98   < > 1.01* 0.88 0.95  CALCIUM 7.6*   < > 7.6* 7.8* 7.7*  PROT 6.6  --   --  5.7* 6.1*  ALBUMIN 3.3*  --   --  2.5* 2.8*  AST 28  --   --  19 22  ALT 10  --   --  9 14  ALKPHOS 67  --   --  50 60  BILITOT 0.6  --   --  0.7 0.5  GFRNONAA 57*   < >  55* >60 59*  GFRAA >60   < > >60 >60 >60  ANIONGAP 15   < > 7 8 6    < > = values in this interval not displayed.     Hematology Recent Labs  Lab 07/17/19 0412 07/17/19 0412 07/18/19 0445 07/20/19 2005 07/20/19 2030  WBC 3.6*  --  3.3*  --  2.8*  RBC 3.30*   < > 3.25* 3.57* 3.61*  HGB 9.3*  --  9.0*  --  10.0*  HCT 30.9*  --  30.7*  --  34.1*  MCV 93.6  --  94.5  --  94.5  MCH 28.2  --  27.7  --  27.7  MCHC 30.1  --  29.3*  --  29.3*  RDW 15.5  --  15.3  --  15.0  PLT 156  --  149*  --  232   < > = values in this interval not displayed.    BNPNo results for input(s): BNP, PROBNP in the last 168 hours.   DDimer No results for input(s): DDIMER in the last 168 hours.   Radiology    DG CHEST PORT 1 VIEW  Result Date: 07/20/2019 CLINICAL DATA:  Fever EXAM: PORTABLE CHEST 1 VIEW COMPARISON:  07/15/2019 FINDINGS: Right Port-A-Cath and left pacer remain in place, unchanged. Heart is normal size. Chronic nodular opacities within the lungs bilaterally, particularly upper lobes, unchanged. No effusions. No acute bony abnormality. IMPRESSION: Stable nodular opacities in the lungs, particularly upper lobes. This likely reflects previously  seen mycobacterium avium infection changes. Electronically Signed   By: Rolm Baptise M.D.   On: 07/20/2019 20:39    Cardiac Studies   07/17/2019: TEE IMPRESSIONS 1. Left ventricular ejection fraction, by estimation, is 60 to 65%. The  left ventricle has normal function. The left ventricle has no regional  wall motion abnormalities. There is mild left ventricular hypertrophy.  2. Right ventricular systolic function is normal. The right ventricular  size is normal.  3. Left atrial size was mildly dilated. No left atrial/left atrial  appendage thrombus was detected.  4. The mitral valve is normal in structure. Mild mitral valve  regurgitation.  5. The aortic valve is tricuspid. Aortic valve regurgitation is not  visualized.  6. No vegetation seen   Patient Profile     74 y.o. female with a hx of PAFib and TIAs, non-obstructive CAD by cath 2011, COPD > recurrent pneumonias >> MAI (follows with Dr. Linus Salmons and Dr. Halford Chessman) on home O2, longstanding h/o anemia (iron def, erythropoietin def) gets regular iron infusions (port R chest, follows with [facility]), chronic CHF, NICM, LBBB > CRT-P with normalization of her EF   Admitted with recurrent fevers  Device information:  MDT CRT-P, implanted 12/21/09 >> gen change 06/19/2019 Dr. Caryl Comes Hx of diaphragmatic stmulation (notes mentions a variety of programming changes to eliminate) Initial implant [id number] was complicated by DVT of LUE  Current generator: Percepta CRT-P A: [id number] implanted 12/21/2009  V: 5076 implanted 12/21/2009 LV: [id number] implanted 12/21/2009  AF Hx GIB on Pradaxa and stopped TIAs 2020 > Eliquis Amiodarone d/c appears secondary to concern given her lung issues years ago  Assessment & Plan     1. Febrile illness 2. PPM pocket hematoma post gen change 4/2//21  Afebrile here though getting percocet Neg TEE last admission for veg, LVEF 60-65% Neg BC x5 days last admission Neg so far this admission (has been on  antibiotics  Site appears unchanged from last time  I saw here (last admission) Dr. Caryl Comes has seen the patient, will communicate with ID She is not device dependent   3. PAFib     Has been off Eliquis for weeks     maintaining SR on tele  4. NICM     Has had recovery of her LVEF with CRT/meds     euvolemic by exam  For questions or updates, please contact [facility] Please consult [email or web address] for contact info under        Signed, Baldwin [place], PA-C  07/22/2019, 8:05 AM   As above  Discussed with Dr Elliot Cousin-- he and I both share reservations about invading the pocket notwithstanding the concerns.  It does not seem to be the source of intermittent fevers to 103  Discussed with Dr CS-- MAI drugs can cause leukopenia, perhaps mask BC --she also is not strongly inclined towards the pocket although it remains a possible source.   Thoughts were to stop ABX, incl MAI drugs, allow for discharge and then undertake serial BCs looking for further evidence of infection  This seems reasonable to me

## 2019-07-23 LAB — CBC
HCT: 31.6 % — ABNORMAL LOW (ref 36.0–46.0)
Hemoglobin: 9.5 g/dL — ABNORMAL LOW (ref 12.0–15.0)
MCH: 27.5 pg (ref 26.0–34.0)
MCHC: 30.1 g/dL (ref 30.0–36.0)
MCV: 91.6 fL (ref 80.0–100.0)
Platelets: 231 10*3/uL (ref 150–400)
RBC: 3.45 MIL/uL — ABNORMAL LOW (ref 3.87–5.11)
RDW: 14.5 % (ref 11.5–15.5)
WBC: 5.5 10*3/uL (ref 4.0–10.5)
nRBC: 0 % (ref 0.0–0.2)

## 2019-07-23 LAB — BASIC METABOLIC PANEL
Anion gap: 7 (ref 5–15)
BUN: 17 mg/dL (ref 8–23)
CO2: 30 mmol/L (ref 22–32)
Calcium: 7.9 mg/dL — ABNORMAL LOW (ref 8.9–10.3)
Chloride: 99 mmol/L (ref 98–111)
Creatinine, Ser: 1.05 mg/dL — ABNORMAL HIGH (ref 0.44–1.00)
GFR calc Af Amer: >60 mL/min (ref >60)
GFR calc non Af Amer: 52 mL/min — ABNORMAL LOW (ref >60)
Glucose, Bld: 107 mg/dL — ABNORMAL HIGH (ref 70–99)
Potassium: 4.1 mmol/L (ref 3.5–5.1)
Sodium: 136 mmol/L (ref 135–145)

## 2019-07-23 LAB — MAGNESIUM: Magnesium: 1.9 mg/dL (ref 1.7–2.4)

## 2019-07-23 MED ORDER — OXYCODONE-ACETAMINOPHEN 5-325 MG PO TABS: 1.0000 | ORAL_TABLET | Freq: Four times a day (QID) | ORAL | 0 refills | Status: DC | PRN

## 2019-07-23 MED ORDER — HEPARIN SOD (PORK) LOCK FLUSH 100 UNIT/ML IV SOLN
500.0000 [IU] | INTRAVENOUS | Status: AC | PRN
  Administered 2019-07-23: 500 [IU]
  Filled 2019-07-23: qty 5

## 2019-07-23 MED ORDER — OXYCODONE HCL 5 MG PO TABS: 5.0000 mg | ORAL_TABLET | ORAL | 0 refills | Status: AC

## 2019-07-23 NOTE — Discharge Summary (Signed)
Physician Discharge Summary  [name]:[id number] DOB: [birth year] DOA: 07/20/2019  PCP: Ann Held, DO  Admit date: 07/20/2019 Discharge date: 07/23/2019  Admitted From: Home Disposition: Home  Recommendations for Outpatient Follow-up:  1. Follow up with PCP in 1-2 weeks 2. Please obtain BMP/CBC in one week 3. Please follow up on the following pending results:  Discharge Condition: Stable CODE STATUS: Full Diet recommendation: Cardiac prudent  Brief/Interim Summary: [name] is a 74 y.o. female with medical history significant of PAF (eliquis on hold since earlier this month), COPD, 3L home O2 baseline, recurrent PNAs ultimately determined to be due to MAI. In March pt had bronchoscopy done, BAL would ultimately come back positive for MAI.  Pt started on rifabutin with azithromycin and ethambutol 3 times weekly as of 4/7. Pt had AICD / PPM replaced on 4/2 (end of battery life). Pt presents to ED with c/o fever and CP.  Symptoms onset this evening.  No increased SOB, does have some cough.She has had pain like this in the past but did not seek medical treatment at that time because "I didn't want to make a fuss" but states it wasn't recent. She has been taking her home antibiotics but did miss her doses this morning. States she hasnt been feeling great for past couple of days, and actually hasnt taken coreg since the 26th. In ED: WBC nl, Tm 102.7, CT chest shows basically unchanged MAI, no PE. BP running low 100s.  Pt non-toxic appearing.  Lactate nl. BCx pending, hospitalist asked to admit.  74 year old with history of chronic respiratory failure on 3 L nasal cannula, COPD, paroxysmal A. fib, pacemaker, CHF, recent MAI infection admitted for fevers as high as 103 at home.  Recently admitted here for pacemaker change complicated by hematoma underwent TEE without any evidence of infection. Due to single episode of fever at previous hospitalization she was discharged on Keflex  for 10 days.  Infectious disease consulted at admission in hopes to help discern possible source of infection.  Patient had some swelling around recently exchanged pacemaker, possible UTI admission.  UA, completed cephalexin on 07/22/2019.  Repeat cultures remain negative, ID and cardiology following closely, given negative findings on TEE we discussed holding patient MAI antibiotics for an additional 3 to 4 weeks with close follow-up in the outpatient setting with PCP and infectious disease with repeat blood culture on 07/29/2019 per their expertise.  Patient otherwise feels quite well and stable and agreeable for discharge home.   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)   Fever   Other neutropenia (HCC)    Discharge Instructions  Discharge Instructions    Call MD for:  difficulty breathing, headache or visual disturbances   Complete by: As directed    Call MD for:  extreme fatigue   Complete by: As directed    Call MD for:  hives   Complete by: As directed    Call MD for:  persistant dizziness or light-headedness   Complete by: As directed    Call MD for:  persistant nausea and vomiting   Complete by: As directed    Call MD for:  redness, tenderness, or signs of infection (pain, swelling, redness, odor or green/yellow discharge around incision site)   Complete by: As  directed    Call MD for:  severe uncontrolled pain   Complete by: As directed    Call MD for:  temperature >100.4   Complete by: As directed    Diet - low sodium heart healthy   Complete by: As directed    Increase activity slowly   Complete by: As directed      Allergies as of 07/23/2019      Reactions   Dabigatran Etexilate Mesylate Other (See Comments)    INTERNAL BLEEDING- Pradaxa   Augmentin [amoxicillin-pot Clavulanate] Diarrhea, Nausea Only   Did it involve swelling of the face/tongue/throat, SOB, or low BP? No Did it involve sudden or severe rash/hives, skin peeling, or any reaction on the inside of your mouth or nose? No Did you need to seek medical attention at a hospital or doctor's office? No When did it last happen?02/2019 If all above answers are "NO", may proceed with cephalosporin use.   Talwin [pentazocine] Other (See Comments)   Hallucinations      Medication List    STOP taking these medications   azithromycin 500 MG tablet Commonly known as: Zithromax   cephALEXin 500 MG capsule Commonly known as: KEFLEX   Eliquis 5 MG Tabs tablet Generic drug: apixaban   ethambutol 100 MG tablet Commonly known as: MYAMBUTOL   ethambutol 400 MG tablet Commonly known as: MYAMBUTOL   nitrofurantoin (macrocrystal-monohydrate) 100 MG capsule Commonly known as: MACROBID   oxyCODONE-acetaminophen 10-325 MG tablet Commonly known as: PERCOCET Replaced by: oxyCODONE-acetaminophen 5-325 MG tablet   promethazine 25 MG tablet Commonly known as: PHENERGAN   rifabutin 150 MG capsule Commonly known as: MYCOBUTIN     TAKE these medications   acetaminophen 650 MG CR tablet Commonly known as: TYLENOL Take 650 mg by mouth every 8 (eight) hours as needed for pain.   amLODipine 2.5 MG tablet Commonly known as: NORVASC Take 1 tablet (2.5 mg total) by mouth daily. What changed: when to take this   atorvastatin 20 MG tablet Commonly known as: LIPITOR TAKE 1 TABLET BY MOUTH ONCE DAILY AT  6PM What changed:   how much to take  how to take this  when to take this  additional instructions   carvedilol 25 MG tablet Commonly known as: COREG Take 1 tablet (25 mg total) by mouth 2 (two) times daily. What changed: when to take this   denosumab 60 MG/ML Sosy injection Commonly known as: PROLIA Inject 60 mg into the skin every 6  (six) months.   furosemide 40 MG tablet Commonly known as: LASIX TAKE 1 TABLET BY MOUTH IN THE MORNING What changed: when to take this   gabapentin 600 MG tablet Commonly known as: NEURONTIN Take 1 tablet by mouth 4 times daily What changed: See the new instructions.   Incruse Ellipta 62.5 MCG/INH Aepb Generic drug: umeclidinium bromide Inhale 1 puff into the lungs daily.   lidocaine-prilocaine cream Commonly known as: EMLA Apply 1 application topically as needed. What changed: reasons to take this   nitroGLYCERIN 0.4 MG SL tablet Commonly known as: NITROSTAT Place 1 tablet (0.4 mg total) under the tongue every 5 (five) minutes as needed. CHEST PAIN What changed:   reasons to take this  additional instructions   oxyCODONE 5 MG immediate release tablet Commonly known as: Oxy IR/ROXICODONE Take 1 tablet (5 mg total) by mouth every 4 (four) hours for 3 days.   oxyCODONE-acetaminophen 5-325 MG tablet Commonly known as: PERCOCET/ROXICET Take 1 tablet by mouth every 6 (six)  hours as needed for up to 5 days for severe pain. Replaces: oxyCODONE-acetaminophen 10-325 MG tablet   OXYGEN Inhale 3 L/min into the lungs continuous.   pantoprazole 40 MG tablet Commonly known as: PROTONIX Take 1 tablet by mouth twice daily   Proventil HFA 108 (90 Base) MCG/ACT inhaler Generic drug: albuterol Inhale 2 puffs into the lungs every 6 (six) hours as needed for wheezing or shortness of breath.   tiZANidine 4 MG tablet Commonly known as: ZANAFLEX Take 1 tablet (4 mg total) by mouth 3 (three) times daily as needed. for muscle spams What changed:   reasons to take this  additional instructions   triamcinolone ointment 0.1 % Commonly known as: KENALOG Apply 1 application topically daily as needed (to affected areas- for psoriasis).   zolpidem 5 MG tablet Commonly known as: AMBIEN TAKE 1 TABLET BY MOUTH EVERY DAY AT BEDTIME AS NEEDED What changed: See the new instructions.       Follow-up Information    Carlyle Basques, MD Follow up.   Specialty: Infectious Diseases Why: 5/12 at 3pm. Please call to reschedule if you are not able to make this appointment. Contact information: [address] [place] [address] [phone]          Allergies  Allergen Reactions  . Dabigatran Etexilate Mesylate Other (See Comments)    INTERNAL BLEEDING- Pradaxa  . Augmentin [Amoxicillin-Pot Clavulanate] Diarrhea and Nausea Only    Did it involve swelling of the face/tongue/throat, SOB, or low BP? No Did it involve sudden or severe rash/hives, skin peeling, or any reaction on the inside of your mouth or nose? No Did you need to seek medical attention at a hospital or doctor's office? No When did it last happen?02/2019 If all above answers are "NO", may proceed with cephalosporin use.   [name] [Pentazocine] Other (See Comments)    Hallucinations     Consultations: Infectious disease, cardiology  Procedures/Studies: CT Angio Chest PE W and/or Wo Contrast  Result Date: 07/16/2019 CLINICAL DATA:  Chest pain and fever. EXAM: CT ANGIOGRAPHY CHEST WITH CONTRAST TECHNIQUE: Multidetector CT imaging of the chest was performed using the standard protocol during bolus administration of intravenous contrast. Multiplanar CT image reconstructions and MIPs were obtained to evaluate the vascular anatomy. CONTRAST:  51m OMNIPAQUE IOHEXOL 350 MG/ML SOLN COMPARISON:  April 27, 2019 FINDINGS: Cardiovascular: There is a dual lead AICD. There is mild to moderate severity calcification of the thoracic aorta. Satisfactory opacification of the pulmonary arteries to the segmental level. No evidence of pulmonary embolism. Normal heart size. No pericardial effusion. Mediastinum/Nodes: There is mild right hilar lymphadenopathy. Lungs/Pleura: Stable moderate to marked severity areas of diffuse bilateral peribronchovascular nodularity and nodular consolidation are seen. There is  no evidence of a pleural effusion or pneumothorax. Upper Abdomen: No acute abnormality. Musculoskeletal: Degenerative changes seen within the thoracic spine. Review of the MIP images confirms the above findings. IMPRESSION: 1. No evidence of acute pulmonary embolism. 2. Stable moderate to marked severity areas of diffuse bilateral peribronchovascular nodularity and nodular consolidation. 3. Aortic atherosclerosis. Aortic Atherosclerosis (ICD10-I70.0). Electronically Signed   By: TVirgina NorfolkM.D.   On: 07/16/2019 00:37   DG CHEST PORT 1 VIEW  Result Date: 07/20/2019 CLINICAL DATA:  Fever EXAM: PORTABLE CHEST 1 VIEW COMPARISON:  07/15/2019 FINDINGS: Right Port-A-Cath and left pacer remain in place, unchanged. Heart is normal size. Chronic nodular opacities within the lungs bilaterally, particularly upper lobes, unchanged. No effusions. No acute bony abnormality. IMPRESSION: Stable  nodular opacities in the lungs, particularly upper lobes. This likely reflects previously seen mycobacterium avium infection changes. Electronically Signed   By: Rolm Baptise M.D.   On: 07/20/2019 20:39   DG Chest Port 1 View  Result Date: 07/15/2019 CLINICAL DATA:  Cough, shortness of breath and fever. EXAM: PORTABLE CHEST 1 VIEW COMPARISON:  Chest radiograph 10/02/2017. FINDINGS: Right anterior chest wall Port-A-Cath is present with tip projecting over the superior vena cava. Multi lead pacer apparatus overlies the left hemithorax, leads stable in position. Monitoring leads overlie the patient. Stable cardiac and mediastinal contours. Aortic atherosclerosis. Similar-appearing nodular opacities predominately involving the central aspect of the lungs bilaterally. No pleural effusion or pneumothorax. IMPRESSION: Scattered nodular opacities favored to correspond with history of MAI. Electronically Signed   By: Lovey Newcomer M.D.   On: 07/15/2019 19:59   ECHO TEE  Result Date: 07/17/2019    TRANSESOPHOGEAL ECHO REPORT   Patient  Name:   [name] Date of Exam: 07/17/2019 Medical Rec #:  [id number]       Height:       60.0 in Accession #:    [id number]      Weight:       88.5 lb Date of Birth:  Aug 03, 1945       BSA:          1.320 m Patient Age:    47 years        BP:           114/61 mmHg Patient Gender: F               HR:           77 bpm. Exam Location:  Inpatient Procedure: Transesophageal Echo, Cardiac Doppler and Color Doppler Indications:     Bacteremia 790.7 / R78.81  History:         Patient has prior history of Echocardiogram examinations, most                  recent 06/01/2017. CHF, CAD, COPD and TIA; Risk                  Factors:Hypertension and Dyslipidemia.  Sonographer:     Jaquita Folds Referring Phys:  [place] Phys: Oswaldo Milian MD PROCEDURE: The transesophogeal probe was passed without difficulty through the esophogus of the patient. Sedation performed by different physician. The patient was monitored while under deep sedation. Anesthestetic sedation was provided intravenously by Anesthesiology: 150m of Propofol. The patient developed no complications during the procedure. IMPRESSIONS  1. Left ventricular ejection fraction, by estimation, is 60 to 65%. The left ventricle has normal function. The left ventricle has no regional wall motion abnormalities. There is mild left ventricular hypertrophy.  2. Right ventricular systolic function is normal. The right ventricular size is normal.  3. Left atrial size was mildly dilated. No left atrial/left atrial appendage thrombus was detected.  4. The mitral valve is normal in structure. Mild mitral valve regurgitation.  5. The aortic valve is tricuspid. Aortic valve regurgitation is not visualized.  6. No vegetation seen FINDINGS  Left Ventricle: Left ventricular ejection fraction, by estimation, is 60 to 65%. The left ventricle has normal function. The left ventricle has no regional wall motion abnormalities. The left ventricular internal  cavity size was normal in size. There is  mild left ventricular hypertrophy. Right Ventricle: The right ventricular size is normal. Right vetricular wall thickness was not assessed. Right ventricular systolic  function is normal. The tricuspid regurgitant velocity is 2.01 m/s, and with an assumed right atrial pressure of 10 mmHg, the estimated right ventricular systolic pressure is 76.2 mmHg. Left Atrium: Left atrial size was mildly dilated. No left atrial/left atrial appendage thrombus was detected. Right Atrium: Right atrial size was normal in size. Prominent Chiari network. Pericardium: There is no evidence of pericardial effusion. Mitral Valve: The mitral valve is normal in structure. Mild mitral valve regurgitation. Tricuspid Valve: The tricuspid valve is normal in structure. Tricuspid valve regurgitation is mild. Aortic Valve: The aortic valve is tricuspid. Aortic valve regurgitation is not visualized. Pulmonic Valve: The pulmonic valve was grossly normal. Pulmonic valve regurgitation is trivial. Aorta: The aortic root and ascending aorta are structurally normal, with no evidence of dilitation. IAS/Shunts: No atrial level shunt detected by color flow Doppler. Additional Comments: A pacer wire is visualized.  TRICUSPID VALVE TR Peak grad:   16.2 mmHg TR Vmax:        201.00 cm/s Oswaldo Milian MD Electronically signed by Oswaldo Milian MD Signature Date/Time: 07/17/2019/5:00:40 PM    Final    CUP PACEART INCLINIC DEVICE CHECK  Result Date: 06/30/2019 Wound check appointment. Steri-strips removed. Wound without redness. Edema present, hematoma soft. Bruising present at incision site that extends along rib cage to left axilla region. Incision edges approximated, wound well healed. Pocket Pal placed ,+ Eliquis, placed on hold until Dr Caryl Comes consulted. Normal device function. Thresholds, sensing, and impedances consistent with implant measurements. Device programmed at 3.5V/auto capture programmed on  for extra safety margin until 3 month visit.FFRWs present , blanking programmed to partial +. Histogram distribution appropriate for patient and level of activity. No mode switches or high ventricular rates noted. Patient educated about wound care, arm mobility, lifting restrictions. ROV in 1 week for wound check. Follow up with Dr Caryl Comes  09/22/19. Next remote due on 09/18/19 .     Subjective: No acute issues or events overnight, denies fevers, chills, nausea, vomiting, diarrhea, constipation, headache.   Discharge Exam: Vitals:   07/22/19 2021 07/23/19 0500  BP: 114/80 102/61  Pulse: 78 76  Resp: 13 13  Temp: 98.2 F (36.8 C) 97.7 F (36.5 C)  SpO2: 99% 99%   Vitals:   07/22/19 1352 07/22/19 1657 07/22/19 2021 07/23/19 0500  BP:   114/80 102/61  Pulse:   78 76  Resp: _0 Temp:   98.2 F (36.8 C) 97.7 F (36.5 C)  TempSrc:   Oral Oral  SpO2:   99% 99%  Weight:    38.6 kg    General: Pt is alert, awake, not in acute distress Cardiovascular: RRR, S1/S2 +, no rubs, no gallops Respiratory: CTA bilaterally, no wheezing, no rhonchi Abdominal: Soft, NT, ND, bowel sounds + Extremities: no edema, no cyanosis    The results of significant diagnostics from this hospitalization (including imaging, microbiology, ancillary and laboratory) are listed below for reference.     Microbiology: Recent Results (from the past 240 hour(s))  Blood Culture (routine x 2)     Status: None   Collection Time: 07/15/19  7:21 PM   Specimen: BLOOD RIGHT FOREARM  Result Value Ref Range Status   Specimen Description BLOOD RIGHT FOREARM  Final   Special Requests   Final    BOTTLES DRAWN AEROBIC ONLY Blood Culture adequate volume   Culture   Final    NO GROWTH 5 DAYS Performed at [facility], [address], [place], [place] [address]  Report Status 07/20/2019 FINAL  Final  Blood Culture (routine x 2)     Status: None   Collection Time: 07/15/19  7:32 PM   Specimen: BLOOD LEFT  HAND  Result Value Ref Range Status   Specimen Description BLOOD LEFT HAND  Final   Special Requests   Final    BOTTLES DRAWN AEROBIC AND ANAEROBIC Blood Culture adequate volume   Culture   Final    NO GROWTH 5 DAYS Performed at [facility], [place] [address] [place], [place] [address]    Report Status 07/20/2019 FINAL  Final  Respiratory Panel by RT PCR (Flu A&B, Covid) - Nasopharyngeal Swab     Status: None   Collection Time: 07/15/19  7:55 PM   Specimen: Nasopharyngeal Swab  Result Value Ref Range Status   SARS Coronavirus 2 by RT PCR NEGATIVE NEGATIVE Final    Comment: (NOTE) SARS-CoV-2 target nucleic acids are NOT DETECTED. The SARS-CoV-2 RNA is generally detectable in upper respiratoy specimens during the acute phase of infection. The lowest concentration of SARS-CoV-2 viral copies this assay can detect is 131 copies/mL. A negative result does not preclude SARS-Cov-2 infection and should not be used as the sole basis for treatment or other patient management decisions. A negative result may occur with  improper specimen collection/handling, submission of specimen other than nasopharyngeal swab, presence of viral mutation(s) within the areas targeted by this assay, and inadequate number of viral copies (<131 copies/mL). A negative result must be combined with clinical observations, patient history, and epidemiological information. The expected result is Negative. Fact Sheet for Patients:  PinkCheek.be Fact Sheet for Healthcare Providers:  GravelBags.it This test is not yet ap proved or cleared by the [place] FDA and  has been authorized for detection and/or diagnosis of SARS-CoV-2 by FDA under an Emergency Use Authorization (EUA). This EUA will remain  in effect (meaning this test can be used) for the duration of the COVID-19 declaration under Section 564(b)(1) of the Act, 21 U.S.C. section 360bbb-3(b)(1),  unless the authorization is terminated or revoked sooner.    Influenza A by PCR NEGATIVE NEGATIVE Final   Influenza B by PCR NEGATIVE NEGATIVE Final    Comment: (NOTE) The Xpert Xpress SARS-CoV-2/FLU/RSV assay is intended as an aid in  the diagnosis of influenza from Nasopharyngeal swab specimens and  should not be used as a sole basis for treatment. Nasal washings and  aspirates are unacceptable for Xpert Xpress SARS-CoV-2/FLU/RSV  testing. Fact Sheet for Patients: PinkCheek.be Fact Sheet for Healthcare Providers: GravelBags.it This test is not yet approved or cleared by the [place] FDA and  has been authorized for detection and/or diagnosis of SARS-CoV-2 by  FDA under an Emergency Use Authorization (EUA). This EUA will remain  in effect (meaning this test can be used) for the duration of the  Covid-19 declaration under Section 564(b)(1) of the Act, 21  U.S.C. section 360bbb-3(b)(1), unless the authorization is  terminated or revoked. Performed at [facility], [place] [address] [place], [place] [address]   Urine culture     Status: Abnormal   Collection Time: 07/16/19  3:12 PM   Specimen: In/Out Cath Urine  Result Value Ref Range Status   Specimen Description IN/OUT CATH URINE  Final   Special Requests   Final    NONE Performed at [facility], [place] [address], [place], [place] [address]    Culture >=100,000 COLONIES/mL KLEBSIELLA PNEUMONIAE (A)  Final   Report Status  07/18/2019 FINAL  Final   Organism ID, Bacteria KLEBSIELLA PNEUMONIAE (A)  Final      Susceptibility   Klebsiella pneumoniae - MIC*    AMPICILLIN RESISTANT Resistant     CEFAZOLIN <=4 SENSITIVE Sensitive     CEFTRIAXONE <=1 SENSITIVE Sensitive     CIPROFLOXACIN <=0.25 SENSITIVE Sensitive     GENTAMICIN <=1 SENSITIVE Sensitive     IMIPENEM <=0.25 SENSITIVE Sensitive     NITROFURANTOIN 64 INTERMEDIATE Intermediate     TRIMETH/SULFA <=20  SENSITIVE Sensitive     AMPICILLIN/SULBACTAM 4 SENSITIVE Sensitive     PIP/TAZO <=4 SENSITIVE Sensitive     * >=100,000 COLONIES/mL KLEBSIELLA PNEUMONIAE  SARS CORONAVIRUS 2 (TAT 6-24 HRS) Nasopharyngeal Nasopharyngeal Swab     Status: None   Collection Time: 07/20/19  7:03 PM   Specimen: Nasopharyngeal Swab  Result Value Ref Range Status   SARS Coronavirus 2 NEGATIVE NEGATIVE Final    Comment: (NOTE) SARS-CoV-2 target nucleic acids are NOT DETECTED. The SARS-CoV-2 RNA is generally detectable in upper and lower respiratory specimens during the acute phase of infection. Negative results do not preclude SARS-CoV-2 infection, do not rule out co-infections with other pathogens, and should not be used as the sole basis for treatment or other patient management decisions. Negative results must be combined with clinical observations, patient history, and epidemiological information. The expected result is Negative. Fact Sheet for Patients: SugarRoll.be Fact Sheet for Healthcare Providers: https://www.woods-mathews.com/ This test is not yet approved or cleared by the [place] FDA and  has been authorized for detection and/or diagnosis of SARS-CoV-2 by FDA under an Emergency Use Authorization (EUA). This EUA will remain  in effect (meaning this test can be used) for the duration of the COVID-19 declaration under Section 56 4(b)(1) of the Act, 21 U.S.C. section 360bbb-3(b)(1), unless the authorization is terminated or revoked sooner. Performed at [facility], [place] [address] [place], [place] [address]   Culture, blood (routine x 2)     Status: None (Preliminary result)   Collection Time: 07/20/19  8:13 PM   Specimen: BLOOD  Result Value Ref Range Status   Specimen Description BLOOD RIGHT ANTECUBITAL  Final   Special Requests   Final    BOTTLES DRAWN AEROBIC AND ANAEROBIC Blood Culture results may not be optimal due to an excessive  volume of blood received in culture bottles   Culture   Final    NO GROWTH 3 DAYS Performed at [facility], [place] [address] [place], [place] [address]    Report Status PENDING  Incomplete  Culture, blood (routine x 2)     Status: None (Preliminary result)   Collection Time: 07/20/19  9:36 PM   Specimen: BLOOD RIGHT ARM  Result Value Ref Range Status   Specimen Description BLOOD RIGHT ARM  Final   Special Requests   Final    BOTTLES DRAWN AEROBIC AND ANAEROBIC Blood Culture adequate volume   Culture   Final    NO GROWTH 3 DAYS Performed at [facility], [place] [address], [place], [place] [address]    Report Status PENDING  Incomplete     Labs: BNP (last 3 results) No results for input(s): BNP in the last 8760 hours. Basic Metabolic Panel: Recent Labs  Lab 07/17/19 0412 07/18/19 0445 07/20/19 2030 07/22/19 0725 07/23/19 0545  NA 137 140 144 137 136  K 4.6 4.3 5.1 4.5 4.1  CL 102 105 112* 102 99  CO2 _0 29  30  GLUCOSE 88 105* 98 101* 107*  BUN _0 CREATININE 1.01* 0.88 0.95 0.76 1.05*  CALCIUM 7.6* 7.8* 7.7* 8.0* 7.9*  MG  --  2.0  --  2.0 1.9   Liver Function Tests: Recent Labs  Lab 07/18/19 0445 07/20/19 2030  AST 19 22  ALT 9 14  ALKPHOS 50 60  BILITOT 0.7 0.5  PROT 5.7* 6.1*  ALBUMIN 2.5* 2.8*   No results for input(s): LIPASE, AMYLASE in the last 168 hours. No results for input(s): AMMONIA in the last 168 hours. CBC: Recent Labs  Lab 07/17/19 0412 07/18/19 0445 07/20/19 2030 07/22/19 0725 07/23/19 0545  WBC 3.6* 3.3* 2.8* 5.2 5.5  NEUTROABS  --   --  1.9  --   --   HGB 9.3* 9.0* 10.0* 9.9* 9.5*  HCT 30.9* 30.7* 34.1* 32.3* 31.6*  MCV 93.6 94.5 94.5 91.8 91.6  PLT 156 149* 232 245 231   Cardiac Enzymes: No results for input(s): CKTOTAL, CKMB, CKMBINDEX, TROPONINI in the last 168 hours. BNP: Invalid input(s): POCBNP CBG: No results for input(s): GLUCAP in the last 168 hours. D-Dimer No results for input(s):  DDIMER in the last 72 hours. Hgb A1c No results for input(s): HGBA1C in the last 72 hours. Lipid Profile No results for input(s): CHOL, HDL, LDLCALC, TRIG, CHOLHDL, LDLDIRECT in the last 72 hours. Thyroid function studies Recent Labs    07/20/19 2005  TSH 7.708*   Anemia work up Recent Labs    07/20/19 2005 07/20/19 2030  VITAMINB12  --  128*  FOLATE 9.2  --   FERRITIN  --  1,432*  TIBC  --  181*  IRON  --  21*  RETICCTPCT 1.0  --    Urinalysis    Component Value Date/Time   COLORURINE YELLOW 07/21/2019 0505   APPEARANCEUR CLEAR 07/21/2019 0505   LABSPEC 1.018 07/21/2019 0505   PHURINE 5.0 07/21/2019 0505   GLUCOSEU NEGATIVE 07/21/2019 0505   GLUCOSEU NEGATIVE 07/24/2011 0908   HGBUR NEGATIVE 07/21/2019 0505   HGBUR negative 05/19/2010 1609   BILIRUBINUR NEGATIVE 07/21/2019 0505   BILIRUBINUR neg 12/09/2017 1512   KETONESUR NEGATIVE 07/21/2019 0505   PROTEINUR 30 (A) 07/21/2019 0505   UROBILINOGEN 0.2 12/09/2017 1512   UROBILINOGEN 0.2 05/17/2014 1217   NITRITE NEGATIVE 07/21/2019 0505   LEUKOCYTESUR TRACE (A) 07/21/2019 0505   Sepsis Labs Invalid input(s): PROCALCITONIN,  WBC,  LACTICIDVEN Microbiology Recent Results (from the past 240 hour(s))  Blood Culture (routine x 2)     Status: None   Collection Time: 07/15/19  7:21 PM   Specimen: BLOOD RIGHT FOREARM  Result Value Ref Range Status   Specimen Description BLOOD RIGHT FOREARM  Final   Special Requests   Final    BOTTLES DRAWN AEROBIC ONLY Blood Culture adequate volume   Culture   Final    NO GROWTH 5 DAYS Performed at [facility], [address] [place], [place] [address]    Report Status 07/20/2019 FINAL  Final  Blood Culture (routine x 2)     Status: None   Collection Time: 07/15/19  7:32 PM   Specimen: BLOOD LEFT HAND  Result Value Ref Range Status   Specimen Description BLOOD LEFT HAND  Final   Special Requests   Final    BOTTLES DRAWN AEROBIC AND ANAEROBIC Blood Culture adequate  volume   Culture   Final    NO GROWTH 5 DAYS Performed at [facility]  Lab, [address] [place], [place] [address]    Report Status 07/20/2019 FINAL  Final  Respiratory Panel by RT PCR (Flu A&B, Covid) - Nasopharyngeal Swab     Status: None   Collection Time: 07/15/19  7:55 PM   Specimen: Nasopharyngeal Swab  Result Value Ref Range Status   SARS Coronavirus 2 by RT PCR NEGATIVE NEGATIVE Final    Comment: (NOTE) SARS-CoV-2 target nucleic acids are NOT DETECTED. The SARS-CoV-2 RNA is generally detectable in upper respiratoy specimens during the acute phase of infection. The lowest concentration of SARS-CoV-2 viral copies this assay can detect is 131 copies/mL. A negative result does not preclude SARS-Cov-2 infection and should not be used as the sole basis for treatment or other patient management decisions. A negative result may occur with  improper specimen collection/handling, submission of specimen other than nasopharyngeal swab, presence of viral mutation(s) within the areas targeted by this assay, and inadequate number of viral copies (<131 copies/mL). A negative result must be combined with clinical observations, patient history, and epidemiological information. The expected result is Negative. Fact Sheet for Patients:  PinkCheek.be Fact Sheet for Healthcare Providers:  GravelBags.it This test is not yet ap proved or cleared by the [place] FDA and  has been authorized for detection and/or diagnosis of SARS-CoV-2 by FDA under an Emergency Use Authorization (EUA). This EUA will remain  in effect (meaning this test can be used) for the duration of the COVID-19 declaration under Section 564(b)(1) of the Act, 21 U.S.C. section 360bbb-3(b)(1), unless the authorization is terminated or revoked sooner.    Influenza A by PCR NEGATIVE NEGATIVE Final   Influenza B by PCR NEGATIVE NEGATIVE Final    Comment:  (NOTE) The Xpert Xpress SARS-CoV-2/FLU/RSV assay is intended as an aid in  the diagnosis of influenza from Nasopharyngeal swab specimens and  should not be used as a sole basis for treatment. Nasal washings and  aspirates are unacceptable for Xpert Xpress SARS-CoV-2/FLU/RSV  testing. Fact Sheet for Patients: PinkCheek.be Fact Sheet for Healthcare Providers: GravelBags.it This test is not yet approved or cleared by the [place] FDA and  has been authorized for detection and/or diagnosis of SARS-CoV-2 by  FDA under an Emergency Use Authorization (EUA). This EUA will remain  in effect (meaning this test can be used) for the duration of the  Covid-19 declaration under Section 564(b)(1) of the Act, 21  U.S.C. section 360bbb-3(b)(1), unless the authorization is  terminated or revoked. Performed at [facility], [place] [address] [place], [place] [address]   Urine culture     Status: Abnormal   Collection Time: 07/16/19  3:12 PM   Specimen: In/Out Cath Urine  Result Value Ref Range Status   Specimen Description IN/OUT CATH URINE  Final   Special Requests   Final    NONE Performed at [facility], [place] [address] [place],  [address]    Culture >=100,000 COLONIES/mL KLEBSIELLA PNEUMONIAE (A)  Final   Report Status 07/18/2019 FINAL  Final   Organism ID, Bacteria KLEBSIELLA PNEUMONIAE (A)  Final      Susceptibility   Klebsiella pneumoniae - MIC*    AMPICILLIN RESISTANT Resistant     CEFAZOLIN <=4 SENSITIVE Sensitive     CEFTRIAXONE <=1 SENSITIVE Sensitive     CIPROFLOXACIN <=0.25 SENSITIVE Sensitive     GENTAMICIN <=1 SENSITIVE Sensitive     IMIPENEM <=0.25 SENSITIVE Sensitive     NITROFURANTOIN 64 INTERMEDIATE Intermediate     TRIMETH/SULFA <=  20 SENSITIVE Sensitive     AMPICILLIN/SULBACTAM 4 SENSITIVE Sensitive     PIP/TAZO <=4 SENSITIVE Sensitive     * >=100,000 COLONIES/mL KLEBSIELLA PNEUMONIAE  SARS  CORONAVIRUS 2 (TAT 6-24 HRS) Nasopharyngeal Nasopharyngeal Swab     Status: None   Collection Time: 07/20/19  7:03 PM   Specimen: Nasopharyngeal Swab  Result Value Ref Range Status   SARS Coronavirus 2 NEGATIVE NEGATIVE Final    Comment: (NOTE) SARS-CoV-2 target nucleic acids are NOT DETECTED. The SARS-CoV-2 RNA is generally detectable in upper and lower respiratory specimens during the acute phase of infection. Negative results do not preclude SARS-CoV-2 infection, do not rule out co-infections with other pathogens, and should not be used as the sole basis for treatment or other patient management decisions. Negative results must be combined with clinical observations, patient history, and epidemiological information. The expected result is Negative. Fact Sheet for Patients: SugarRoll.be Fact Sheet for Healthcare Providers: https://www.woods-mathews.com/ This test is not yet approved or cleared by the [place] FDA and  has been authorized for detection and/or diagnosis of SARS-CoV-2 by FDA under an Emergency Use Authorization (EUA). This EUA will remain  in effect (meaning this test can be used) for the duration of the COVID-19 declaration under Section 56 4(b)(1) of the Act, 21 U.S.C. section 360bbb-3(b)(1), unless the authorization is terminated or revoked sooner. Performed at [facility], [place] [address], [place], [place] [address]   Culture, blood (routine x 2)     Status: None (Preliminary result)   Collection Time: 07/20/19  8:13 PM   Specimen: BLOOD  Result Value Ref Range Status   Specimen Description BLOOD RIGHT ANTECUBITAL  Final   Special Requests   Final    BOTTLES DRAWN AEROBIC AND ANAEROBIC Blood Culture results may not be optimal due to an excessive volume of blood received in culture bottles   Culture   Final    NO GROWTH 3 DAYS Performed at [facility], [place] [address], [place], [place] [address]     Report Status PENDING  Incomplete  Culture, blood (routine x 2)     Status: None (Preliminary result)   Collection Time: 07/20/19  9:36 PM   Specimen: BLOOD RIGHT ARM  Result Value Ref Range Status   Specimen Description BLOOD RIGHT ARM  Final   Special Requests   Final    BOTTLES DRAWN AEROBIC AND ANAEROBIC Blood Culture adequate volume   Culture   Final    NO GROWTH 3 DAYS Performed at [facility], [place] [address], [place],  [address]    Report Status PENDING  Incomplete     Time coordinating discharge: Over 30 minutes  SIGNED:   Little Ishikawa, DO [facility] 07/23/2019, 4:01 PM Pager   If 7PM-7AM, please contact night-coverage [email or web address]

## 2019-07-23 NOTE — Telephone Encounter (Signed)
Patient's daughter, Ebony Hail, is calling to follow up in regards to hematoma and findings during ED visit. Ebony Hail states the patient is leaving the hospital today and she is requesting to update Dr. Caryl Comes. Please call to discuss.

## 2019-07-23 NOTE — Progress Notes (Signed)
PT Cancellation Note  Patient Details Name: [name] MRN: [id number] DOB: 03-03-1946   Cancelled Treatment:    Reason Eval/Treat Not Completed: PT screened, no needs identified, will sign off.  Pt states she is independent and has assist at home.  MD agreed to defer PT eval.     Godfrey Pick Cornellius Kropp 07/23/2019, 10:51 AM [name] W,PT Acute Rehabilitation Services Pager:  [phone]  Office:  [phone]

## 2019-07-24 ENCOUNTER — Other Ambulatory Visit: Payer: Self-pay

## 2019-07-24 NOTE — Telephone Encounter (Signed)
Spoke with pt and advised Dr Caryl Comes is at the hospital performing procedures today.  Pt states she is doing well and was discharged from hospital yesterday.  Pt states she has a follow up appointment scheduled for 07/28/2019 for wound check and she has another provider appointment that day and wishes to reschedule.  Pt advised RN will speak with device clinic and someone will call her back to reschedule appointment.  Pt verbalizes understanding and agrees with current plan.

## 2019-07-24 NOTE — Patient Outreach (Signed)
  [facility]) Care Management Chronic Special Needs Program  07/24/2019  Name: [name] DOB: 24-May-1945  MRN: [id number]  [name] is enrolled in a chronic special needs plan for Heart Failure. Reviewed and updated care plan.  Client discharged to home on 07/23/19.  Client admitted on 07/20/19 with dx of fever  Goals Addressed            This Visit's Progress   . COMPLETED: General - Client will not be readmitted within 30 days (C-SNP)   Not on track    Goal not met client readmitted on 07/20/19    . General - Client will not be readmitted within 30 days (C-SNP)discharged 07/23/19       Discharged on 07/23/19 with diagnosis of acute febrile illness [facility] completed transition of care call on 07/23/19  Please follow discharge instructions and call provider if you have any questions. Call provider for fever  Please attend all follow up appointments as scheduled. Please take your medications as prescribed. Please call 24 Hour nurse advice line as needed [phone]).  Continue to work with [facility] for complex management and call as needed at [phone] Communicated with [facility] of clients discharge and [facility] continue follow post discharge     Care plan updated Communicated to [facility] team of clients discharge and [facility] will complete post discharge visit [facility] completed transition of care call on 07/23/19 per [facility] record review  Plan:  Send  outreach letter with a copy of their individualized care plan and Send individual care plan to provider Plan to follow up on any EMMI red flags Chronic care management coordinator will review [facility]'s plan of care and will collaborate with [facility] team as indicated               Chronic care management coordinator will outreach in:  One month    Garvin, [name], [place] Management Coordinator [facility] [phone]

## 2019-07-25 LAB — CULTURE, BLOOD (ROUTINE X 2)
Culture: NO GROWTH
Culture: NO GROWTH
Special Requests: ADEQUATE

## 2019-07-27 ENCOUNTER — Encounter: Payer: Self-pay | Admitting: Family

## 2019-07-27 ENCOUNTER — Other Ambulatory Visit: Payer: Self-pay | Admitting: Pharmacy Technician

## 2019-07-27 ENCOUNTER — Inpatient Hospital Stay: Payer: HMO | Attending: Family

## 2019-07-27 ENCOUNTER — Other Ambulatory Visit: Payer: Self-pay

## 2019-07-27 ENCOUNTER — Telehealth: Payer: Self-pay | Admitting: Family

## 2019-07-27 ENCOUNTER — Inpatient Hospital Stay: Payer: HMO

## 2019-07-27 ENCOUNTER — Inpatient Hospital Stay (HOSPITAL_BASED_OUTPATIENT_CLINIC_OR_DEPARTMENT_OTHER): Payer: HMO | Admitting: Family

## 2019-07-27 VITALS — BP 122/54 | HR 66 | Temp 98.0°F | Resp 16 | Ht 60.0 in | Wt 91.0 lb

## 2019-07-27 VITALS — BP 122/54 | HR 66 | Temp 98.0°F | Resp 16 | Ht 60.0 in | Wt 89.0 lb

## 2019-07-27 DIAGNOSIS — D631 Anemia in chronic kidney disease: Secondary | ICD-10-CM

## 2019-07-27 DIAGNOSIS — Z95828 Presence of other vascular implants and grafts: Secondary | ICD-10-CM

## 2019-07-27 DIAGNOSIS — D500 Iron deficiency anemia secondary to blood loss (chronic): Secondary | ICD-10-CM

## 2019-07-27 DIAGNOSIS — N189 Chronic kidney disease, unspecified: Secondary | ICD-10-CM | POA: Insufficient documentation

## 2019-07-27 LAB — CMP (CANCER CENTER ONLY)
ALT: 46 U/L — ABNORMAL HIGH (ref 0–44)
AST: 35 U/L (ref 15–41)
Albumin: 3.5 g/dL (ref 3.5–5.0)
Alkaline Phosphatase: 222 U/L — ABNORMAL HIGH (ref 38–126)
Anion gap: 5 (ref 5–15)
BUN: 17 mg/dL (ref 8–23)
CO2: 33 mmol/L — ABNORMAL HIGH (ref 22–32)
Calcium: 8.2 mg/dL — ABNORMAL LOW (ref 8.9–10.3)
Chloride: 104 mmol/L (ref 98–111)
Creatinine: 0.83 mg/dL (ref 0.44–1.00)
GFR, Est AFR Am: >60 mL/min (ref >60)
GFR, Estimated: >60 mL/min (ref >60)
Glucose, Bld: 120 mg/dL — ABNORMAL HIGH (ref 70–99)
Potassium: 4.3 mmol/L (ref 3.5–5.1)
Sodium: 142 mmol/L (ref 135–145)
Total Bilirubin: 0.3 mg/dL (ref 0.3–1.2)
Total Protein: 6.4 g/dL — ABNORMAL LOW (ref 6.5–8.1)

## 2019-07-27 LAB — CBC WITH DIFFERENTIAL (CANCER CENTER ONLY)
Abs Immature Granulocytes: 0.01 10*3/uL (ref 0.00–0.07)
Basophils Absolute: 0 10*3/uL (ref 0.0–0.1)
Basophils Relative: 0 %
Eosinophils Absolute: 0.1 10*3/uL (ref 0.0–0.5)
Eosinophils Relative: 3 %
HCT: 31.5 % — ABNORMAL LOW (ref 36.0–46.0)
Hemoglobin: 9.3 g/dL — ABNORMAL LOW (ref 12.0–15.0)
Immature Granulocytes: 0 %
Lymphocytes Relative: 10 %
Lymphs Abs: 0.5 10*3/uL — ABNORMAL LOW (ref 0.7–4.0)
MCH: 27.3 pg (ref 26.0–34.0)
MCHC: 29.5 g/dL — ABNORMAL LOW (ref 30.0–36.0)
MCV: 92.4 fL (ref 80.0–100.0)
Monocytes Absolute: 0.4 10*3/uL (ref 0.1–1.0)
Monocytes Relative: 8 %
Neutro Abs: 4.1 10*3/uL (ref 1.7–7.7)
Neutrophils Relative %: 79 %
Platelet Count: 259 10*3/uL (ref 150–400)
RBC: 3.41 MIL/uL — ABNORMAL LOW (ref 3.87–5.11)
RDW: 14.3 % (ref 11.5–15.5)
WBC Count: 5.2 10*3/uL (ref 4.0–10.5)
nRBC: 0 % (ref 0.0–0.2)

## 2019-07-27 LAB — RETICULOCYTES
Immature Retic Fract: 10.3 % (ref 2.3–15.9)
RBC.: 3.15 MIL/uL — ABNORMAL LOW (ref 3.87–5.11)
Retic Count, Absolute: 29.5 10*3/uL (ref 19.0–186.0)
Retic Ct Pct: 0.9 % (ref 0.4–3.1)

## 2019-07-27 MED ORDER — EPOETIN ALFA-EPBX 40000 UNIT/ML IJ SOLN
40000.0000 [IU] | Freq: Once | INTRAMUSCULAR | Status: AC
  Administered 2019-07-27: 40000 [IU] via SUBCUTANEOUS

## 2019-07-27 MED ORDER — HEPARIN SOD (PORK) LOCK FLUSH 100 UNIT/ML IV SOLN
500.0000 [IU] | Freq: Once | INTRAVENOUS | Status: AC
  Administered 2019-07-27: 500 [IU] via INTRAVENOUS
  Filled 2019-07-27: qty 5

## 2019-07-27 MED ORDER — EPOETIN ALFA-EPBX 40000 UNIT/ML IJ SOLN
INTRAMUSCULAR | Status: AC
  Filled 2019-07-27: qty 1

## 2019-07-27 MED ORDER — SODIUM CHLORIDE 0.9% FLUSH
10.0000 mL | Freq: Once | INTRAVENOUS | Status: AC
  Administered 2019-07-27: 10 mL via INTRAVENOUS
  Filled 2019-07-27: qty 10

## 2019-07-27 NOTE — Patient Outreach (Signed)
[facility]) Care Management  07/27/2019  [name] 20-Feb-1946 [id number]   Care coordination call placed to [employer] in regards to patient's application for Proventil HFA.  Spoke to [name] who informed patient was APPROVED 07/07/2019-12/3/12021. [name] informed the medication was filled on 07/20/2019 and delivered on 07/25/2019 to the patient's front door.  Will follow up with patient with this information.  [name], [place]  [phone]

## 2019-07-27 NOTE — Patient Instructions (Signed)

## 2019-07-27 NOTE — Telephone Encounter (Signed)
Called and spoke with patient about appoiintments being added she was OK with both date/times

## 2019-07-27 NOTE — Patient Outreach (Signed)
[facility]) Care Management  07/27/2019  [name] 1945-11-13 [id number]  ADDENDUM   Unsuccessful call placed to patient regarding patient assistance medication delivery of Proventil HFA from [employer], HIPAA compliant voicemail left.   Was calling to confirm patient has received the medication that was delivered on 07/25/2019 per [employer].  Follow up:  Will follow up with 2nd outreach call in 5-10 business days if call is not returned.  Krystalle Pilkington P. Tommaso Cavitt, [place]  [phone]

## 2019-07-27 NOTE — Progress Notes (Signed)
Hematology and Oncology Follow Up Visit  [name] [id number] May 24, 1945 74 y.o. 07/27/2019   Principle Diagnosis:  Iron deficiency anemia Erythropoietin deficient anemia  Current Therapy: IV iron as indicated Retacrit 40,000 units SQfor hgb <11   Interim History:  [name] is here today for follow-up and Retacrit. She states that she has been hospitalized twice since we last saw her for UTI.  She is home now and slowly recuperating. She is still fatigued and SOB with any exertion.  She is on 3L [place] supplemental O2 24 hours a day.  Hgb is 9.3, MCV 92.  She has not noted any blood loss. No petechiae.  No fever, chills, n/v, cough, rash, dizziness, chest pain, palpitations, abdominal pain or changes in bowel or bladder habits.  No swelling or tenderness in her extremities.  The neuropathy in her hands and feet still waxes and wanes.  No falls or syncopal episodes to report.  Her appetite is improving and she is staying well hydrated. Her weight is stable.   ECOG Performance Status: 1 - Symptomatic but completely ambulatory  Medications:  Allergies as of 07/27/2019      Reactions   Dabigatran Etexilate Mesylate Other (See Comments)   INTERNAL BLEEDING- Pradaxa   Augmentin [amoxicillin-pot Clavulanate] Diarrhea, Nausea Only   Did it involve swelling of the face/tongue/throat, SOB, or low BP? No Did it involve sudden or severe rash/hives, skin peeling, or any reaction on the inside of your mouth or nose? No Did you need to seek medical attention at a hospital or doctor's office? No When did it last happen?02/2019 If all above answers are "NO", may proceed with cephalosporin use.   Talwin [pentazocine] Other (See Comments)   Hallucinations      Medication List       Accurate as of Jul 27, 2019 12:53 PM. If you have any questions, ask your nurse or doctor.        acetaminophen 650 MG CR tablet Commonly known as: TYLENOL Take 650 mg by mouth every 8  (eight) hours as needed for pain.   amLODipine 2.5 MG tablet Commonly known as: NORVASC Take 1 tablet (2.5 mg total) by mouth daily. What changed: when to take this   atorvastatin 20 MG tablet Commonly known as: LIPITOR TAKE 1 TABLET BY MOUTH ONCE DAILY AT  6PM What changed:   how much to take  how to take this  when to take this  additional instructions   carvedilol 25 MG tablet Commonly known as: COREG Take 1 tablet (25 mg total) by mouth 2 (two) times daily. What changed: when to take this   denosumab 60 MG/ML Sosy injection Commonly known as: PROLIA Inject 60 mg into the skin every 6 (six) months.   furosemide 40 MG tablet Commonly known as: LASIX TAKE 1 TABLET BY MOUTH IN THE MORNING What changed: when to take this   gabapentin 600 MG tablet Commonly known as: NEURONTIN Take 1 tablet by mouth 4 times daily What changed: See the new instructions.   Incruse Ellipta 62.5 MCG/INH Aepb Generic drug: umeclidinium bromide Inhale 1 puff into the lungs daily.   lidocaine-prilocaine cream Commonly known as: EMLA Apply 1 application topically as needed. What changed: reasons to take this   nitroGLYCERIN 0.4 MG SL tablet Commonly known as: NITROSTAT Place 1 tablet (0.4 mg total) under the tongue every 5 (five) minutes as needed. CHEST PAIN What changed:   reasons to take this  additional instructions   oxyCODONE-acetaminophen  5-325 MG tablet Commonly known as: PERCOCET/ROXICET Take 1 tablet by mouth every 6 (six) hours as needed for up to 5 days for severe pain.   OXYGEN Inhale 3 L/min into the lungs continuous.   pantoprazole 40 MG tablet Commonly known as: PROTONIX Take 1 tablet by mouth twice daily   Proventil HFA 108 (90 Base) MCG/ACT inhaler Generic drug: albuterol Inhale 2 puffs into the lungs every 6 (six) hours as needed for wheezing or shortness of breath.   tiZANidine 4 MG tablet Commonly known as: ZANAFLEX Take 1 tablet (4 mg total) by  mouth 3 (three) times daily as needed. for muscle spams What changed:   reasons to take this  additional instructions   triamcinolone ointment 0.1 % Commonly known as: KENALOG Apply 1 application topically daily as needed (to affected areas- for psoriasis).   zolpidem 5 MG tablet Commonly known as: AMBIEN TAKE 1 TABLET BY MOUTH EVERY DAY AT BEDTIME AS NEEDED What changed: See the new instructions.       Allergies:  Allergies  Allergen Reactions  . Dabigatran Etexilate Mesylate Other (See Comments)    INTERNAL BLEEDING- Pradaxa  . Augmentin [Amoxicillin-Pot Clavulanate] Diarrhea and Nausea Only    Did it involve swelling of the face/tongue/throat, SOB, or low BP? No Did it involve sudden or severe rash/hives, skin peeling, or any reaction on the inside of your mouth or nose? No Did you need to seek medical attention at a hospital or doctor's office? No When did it last happen?02/2019 If all above answers are "NO", may proceed with cephalosporin use.   [name] [Pentazocine] Other (See Comments)    Hallucinations     Past Medical History, Surgical history, Social history, and Family History were reviewed and updated.  Review of Systems: All other 10 point review of systems is negative.   Physical Exam:  height is 5' (1.524 m) and weight is 91 lb (41.3 kg). Her temporal temperature is 98 F (36.7 C). Her blood pressure is 122/54 (abnormal) and her pulse is 66. Her respiration is 16 and oxygen saturation is 100%.   Wt Readings from Last 3 Encounters:  07/27/19 91 lb (41.3 kg)  07/27/19 89 lb (40.4 kg)  07/23/19 85 lb 1.6 oz (38.6 kg)    Ocular: Sclerae unicteric, pupils equal, round and reactive to light Ear-nose-throat: Oropharynx clear, dentition fair Lymphatic: No cervical or supraclavicular adenopathy Lungs no rales or rhonchi, good excursion bilaterally Heart regular rate and rhythm, no murmur appreciated Abd soft, nontender, positive bowel sounds, no liver  or spleen tip palpated on exam, no fluid wave  MSK no focal spinal tenderness, no joint edema Neuro: non-focal, well-oriented, appropriate affect Breasts: Deferred   Lab Results  Component Value Date   WBC 5.2 07/27/2019   HGB 9.3 (L) 07/27/2019   HCT 31.5 (L) 07/27/2019   MCV 92.4 07/27/2019   PLT 259 07/27/2019   Lab Results  Component Value Date   FERRITIN 1,432 (H) 07/20/2019   IRON 21 (L) 07/20/2019   TIBC 181 (L) 07/20/2019   UIBC 160 07/20/2019   IRONPCTSAT 12 07/20/2019   Lab Results  Component Value Date   RETICCTPCT 0.9 07/27/2019   RBC 3.41 (L) 07/27/2019   RBC 3.15 (L) 07/27/2019   RETICCTABS 64.6 04/01/2009   No results found for: KPAFRELGTCHN, LAMBDASER, KAPLAMBRATIO No results found for: IGGSERUM, IGA, IGMSERUM No results found for: TOTALPROTELP, ALBUMINELP, A1GS, A2GS, BETS, BETA2SER, [place], [place], SPEI   Chemistry  Component Value Date/Time   NA 142 07/27/2019 1130   NA 135 06/16/2019 1333   K 4.3 07/27/2019 1130   CL 104 07/27/2019 1130   CO2 33 (H) 07/27/2019 1130   BUN 17 07/27/2019 1130   BUN 31 (H) 06/16/2019 1333   CREATININE 0.83 07/27/2019 1130   CREATININE 0.91 03/16/2016 1539      Component Value Date/Time   CALCIUM 8.2 (L) 07/27/2019 1130   ALKPHOS 222 (H) 07/27/2019 1130   AST 35 07/27/2019 1130   ALT 46 (H) 07/27/2019 1130   BILITOT 0.3 07/27/2019 1130       Impression and Plan: Ms. Omara is a very pleasant 74yo caucasian female with iron deficiency anemia secondary to malabsorption and possible GI blood loss with Eliquis. She received Retacrit today for Hgb 9.3.  Iron studies are pending and we will replace next week if needed.  We will plan to see her again in another 3 weeks.  She will contact our office with any questions or concerns. We can certainly see her sooner if needed.   Laverna Peace, NP 5/10/202112:53 PM

## 2019-07-28 ENCOUNTER — Ambulatory Visit (INDEPENDENT_AMBULATORY_CARE_PROVIDER_SITE_OTHER): Payer: HMO | Admitting: Emergency Medicine

## 2019-07-28 ENCOUNTER — Telehealth: Payer: Self-pay | Admitting: Emergency Medicine

## 2019-07-28 DIAGNOSIS — I495 Sick sinus syndrome: Secondary | ICD-10-CM

## 2019-07-28 LAB — IRON AND TIBC
Iron: 71 ug/dL (ref 41–142)
Saturation Ratios: 41 % (ref 21–57)
TIBC: 175 ug/dL — ABNORMAL LOW (ref 236–444)
UIBC: 104 ug/dL — ABNORMAL LOW (ref 120–384)

## 2019-07-28 LAB — FERRITIN: Ferritin: 1794 ng/mL — ABNORMAL HIGH (ref 11–307)

## 2019-07-28 NOTE — Telephone Encounter (Signed)
Per Dr Caryl Comes , patient notified to resume Eliquis . Will contact device clinic if she develops increased edema at wound site.

## 2019-07-29 ENCOUNTER — Inpatient Hospital Stay: Payer: HMO | Admitting: Internal Medicine

## 2019-07-30 ENCOUNTER — Ambulatory Visit: Payer: HMO | Admitting: Pharmacist

## 2019-07-30 ENCOUNTER — Other Ambulatory Visit: Payer: Self-pay

## 2019-07-30 DIAGNOSIS — I10 Essential (primary) hypertension: Secondary | ICD-10-CM

## 2019-07-30 DIAGNOSIS — M810 Age-related osteoporosis without current pathological fracture: Secondary | ICD-10-CM

## 2019-07-30 NOTE — Patient Instructions (Signed)
Visit Information  Goals Addressed            This Visit's Progress   . Pharmacy Care Plan            CARE PLAN ENTRY  Current Barriers:  . Chronic Disease Management support, education, and care coordination needs related to Afib, COPD, HF, HTN, HLD, CAD, Chronic Pain Syndrome, GERD, Insomnia  Pharmacist Clinical Goal(s):  [name] Hypertension o Over the next 90 days patient's blood pressure will be less than 140/90 while not having hypotension or blood pressure less than 90/60 . Osteoporosis o Over the next 180 days patient will reduce risk of osteoporotic fracture by re-initiating Prolia . Health Maintenance  o Over the next 180 days patient will complete Shingrix vaccine series o Over the next 180 days patient will complete COVID vaccine series if agreeable  Interventions: . Comprehensive medication review performed. . Check blood pressure 2-3 times per week . Get DEXA Scan ordered (completed by Dr. Etter Sjogren)  Patient Self Care Activities:  . Patient verbalizes understanding of plan to follow as described above, Self administers medications as prescribed, Calls pharmacy for medication refills, and Calls provider office for new concerns or questions . Hypertension o Over the next 90 days patient will check blood pressure 2-3 times per week and record readings . Osteoporosis o Over the next 180 days patient will get DEXA Scan o Over the next 180 days patient will patient will restart Prolia injections for osteoporosis . Health Maintenance  o Over the next 180 days patient will complete Shingrix vaccine series o Over the next 180 days patient will complete COVID vaccine series if agreeable  Please see past updates related to this goal by clicking on the "Past Updates" button in the selected goal         Patient verbalizes understanding of instructions provided today.   Telephone follow up appointment with pharmacy team member scheduled for: 11/02/2019  Melvenia Beam Lee-Ann Gal,  PharmD Clinical Pharmacist [facility] [phone]

## 2019-07-30 NOTE — Chronic Care Management (AMB) (Signed)
Chronic Care Management Pharmacy  Name: [name]  MRN: [id number] DOB: 11/28/1945   Chief Complaint/ HPI  [name],  74 y.o. , female presents for their Follow-Up CCM visit with the clinical pharmacist via telephone visit.  PCP : Ann Held, DO  Their chronic conditions include: Afib, COPD, HF, HTN, HLD, CAD, Chronic Pain Syndrome, GERD, Insomnia  Office Visits: None since last CCM visit on 07/01/19.  Consult Visit: 07/28/19: Cardio call - Pt notified to resume Eliquis  07/27/19: Hem/Onc visit w/ Laverna Peace, NP - Iron deficiency anemia. Pt received retacrit. RTC in 3 weeks.  07/17/19: TEE performed by Dr. Gardiner Rhyme. No vegetation seen  07/13/19: Cardio visit w/ Levander Campion, RN - Wound check post pacemaker change out. No change from previous. Dr. Curt Bears recommended hold Eliquis until 07/20/19.   07/07/19: Cardio visit w/ Levander Campion, RN - Wound check post pacemaker change out. Increased edema and hematoma firm to touch. Dr. Curt Bears recommended Eliquis hold for 5 days. RTC for wound check 07/13/19.  07/06/19: Hem/Onc visit w/ Laverna Peace, NP - Iron deficiency anemia - BIV pacemaker generator changed out 2 weeks prior. Hbg dropped from 12.3 to 9.4 in 4 weeks. Pt given retacrit injections  ED Visit:  07/20/19: [facility] -  Fever. Repeat cultures remain negative. Given negative findings on TEE discussed holding MAI abx for additional 3-4 weeks with close follow up with PCP and ID with repeat blood culture on 07/29/19 per ID expertise. Pt stable for discharge  07/15/19: [facility] - Pulmonary MAI, Precordial pain, acute febrile illness. Dx with UTI and placed on cephalexin 572m TID at discharge 07/18/19.   Medications: Outpatient Encounter Medications as of 07/30/2019  Medication Sig Note  . acetaminophen (TYLENOL) 650 MG CR tablet Take 650 mg by mouth every 8 (eight) hours as needed for pain.   .Marland Kitchenalbuterol (PROVENTIL  HFA) 108 (90 Base) MCG/ACT inhaler Inhale 2 puffs into the lungs every 6 (six) hours as needed for wheezing or shortness of breath. 07/15/2019: [facility] [place] [facility] [place] amLODipine (NORVASC) 2.5 MG tablet Take 1 tablet (2.5 mg total) by mouth daily. (Patient taking differently: Take 2.5 mg by mouth at bedtime. )   . atorvastatin (LIPITOR) 20 MG tablet TAKE 1 TABLET BY MOUTH ONCE DAILY AT  6PM (Patient taking differently: Take 20 mg by mouth every evening. )   . carvedilol (COREG) 25 MG tablet Take 1 tablet (25 mg total) by mouth 2 (two) times daily. (Patient taking differently: Take 25 mg by mouth at bedtime. ) 07/20/2019: Regimen confirmed to be accurate by the patient- takes only at bedtime (25 mg)  . denosumab (PROLIA) 60 MG/ML SOSY injection Inject 60 mg into the skin every 6 (six) months. 07/15/2019: Not yet re-started, per the patient  . furosemide (LASIX) 40 MG tablet TAKE 1 TABLET BY MOUTH IN THE MORNING (Patient taking differently: Take 40 mg by mouth in the morning. )   . gabapentin (NEURONTIN) 600 MG tablet Take 1 tablet by mouth 4 times daily (Patient taking differently: Take 600 mg by mouth 4 (four) times daily. )   . lidocaine-prilocaine (EMLA) cream Apply 1 application topically as needed. (Patient taking differently: Apply 1 application topically as needed (for port access- to numb). )   . nitroGLYCERIN (NITROSTAT) 0.4 MG SL tablet Place 1 tablet (0.4 mg total) under the tongue every 5 (five) minutes as needed. CHEST PAIN (Patient taking differently:  Place 0.4 mg under the tongue every 5 (five) minutes as needed for chest pain. )   . OXYGEN Inhale 3 L/min into the lungs continuous.    . pantoprazole (PROTONIX) 40 MG tablet Take 1 tablet by mouth twice daily (Patient taking differently: Take 40 mg by mouth 2 (two) times daily. )   . tiZANidine (ZANAFLEX) 4 MG tablet Take 1 tablet (4 mg total) by mouth 3 (three) times daily as needed. for muscle spams (Patient taking  differently: Take 4 mg by mouth 3 (three) times daily as needed for muscle spasms. )   . triamcinolone ointment (KENALOG) 0.1 % Apply 1 application topically daily as needed (to affected areas- for psoriasis).    [name] umeclidinium bromide (INCRUSE ELLIPTA) 62.5 MCG/INH AEPB Inhale 1 puff into the lungs daily. 05/04/2019: Gets through [place] P[facility]  . zolpidem (AMBIEN) 5 MG tablet TAKE 1 TABLET BY MOUTH EVERY Neri Samek AT BEDTIME AS NEEDED (Patient taking differently: Take 5 mg by mouth at bedtime. )   . [DISCONTINUED] iron dextran complex (INFED) 50 MG/ML injection Please give infed infusion (no test dose needed) over 4 hours per pharmacy calculated dose. Ht: 5'2 Wt: 99 pounds Hgb: 12    No facility-administered encounter medications on file as of 07/30/2019.   Immunization History  Administered Date(s) Administered  . DTaP 11/03/1996, 01/04/1997, 05/03/1997, 10/19/1997, 08/05/2001  . Fluad Quad(high Dose 65+) 11/06/2018  . HPV Quadrivalent 09/05/2009, 09/13/2010, 09/26/2011  . Hepatitis A, Ped/Adol-2 Dose 09/21/2004, 03/29/2005  . Hepatitis B, ped/adol 04/30/1996, 11/03/1996, 10/19/1997  . HiB (PRP-T) 11/03/1996, 01/04/1997, 05/03/1997, 10/19/1997  . IPV 11/03/1996, 01/04/1997, 05/03/1997, 08/05/2001  . Influenza Split 01/16/2011, 12/12/2011  . Influenza Whole 01/15/2007, 12/08/2007, 12/28/2008, 12/17/2009  . Influenza, High Dose Seasonal PF 11/28/2015, 12/07/2016, 11/15/2017  . Influenza, Seasonal, Injecte, Preservative Fre 12/09/2012  . Influenza,Quad,Nasal, Live 03/17/2007, 03/06/2009, 01/08/2010, 01/30/2011, 02/09/2012, 01/18/2013  . Influenza,inj,Quad PF,6+ Mos 03/29/2005, 04/30/2005, 03/01/2006, 01/28/2008, 12/01/2013, 01/16/2014, 12/22/2014, 12/28/2014  . MMR 05/03/1997, 08/05/2001  . Meningococcal B, OMV 12/22/2014  . Pneumococcal Conjugate-13 06/10/2014, 11/15/2014  . Pneumococcal Polysaccharide-23 12/17/2009  . Tdap 09/17/2007, 05/15/2014, 12/09/2018  . Varicella 11/16/1997, 05/18/2008  .  Zoster 12/31/2013   SDOH Screenings   Alcohol Screen:   . Last Alcohol Screening Score (AUDIT):   Depression (PHQ2-9): Low Risk   . PHQ-2 Score: 0  Financial Resource Strain: Low Risk   . Difficulty of Paying Living Expenses: Not very hard  Food Insecurity: No Food Insecurity  . Worried About Charity fundraiser in the Last Year: Never true  . Ran Out of Food in the Last Year: Never true  Housing:   . Last Housing Risk Score:   Physical Activity:   . Days of Exercise per Week:   . Minutes of Exercise per Session:   Social Connections:   . Frequency of Communication with Friends and Family:   . Frequency of Social Gatherings with Friends and Family:   . Attends Religious Services:   . Active Member of Clubs or Organizations:   . Attends Archivist Meetings:   [name] Marital Status:   Stress:   . Feeling of Stress :   Tobacco Use: Medium Risk  . Smoking Tobacco Use: Former Smoker  . Smokeless Tobacco Use: Never Used  Transportation Needs: No Transportation Needs  . Lack of Transportation (Medical): No  . Lack of Transportation (Non-Medical): No     Current Diagnosis/Assessment:  Goals Addressed  This Visit's Progress   . Pharmacy Care Plan            CARE PLAN ENTRY  Current Barriers:  . Chronic Disease Management support, education, and care coordination needs related to Afib, COPD, HF, HTN, HLD, CAD, Chronic Pain Syndrome, GERD, Insomnia  Pharmacist Clinical Goal(s):  [name] Hypertension o Over the next 90 days patient's blood pressure will be less than 140/90 while not having hypotension or blood pressure less than 90/60 . Osteoporosis o Over the next 180 days patient will reduce risk of osteoporotic fracture by re-initiating Prolia . Health Maintenance  o Over the next 180 days patient will complete Shingrix vaccine series o Over the next 180 days patient will complete COVID vaccine series if agreeable  Interventions: . Comprehensive medication  review performed. . Check blood pressure 2-3 times per week . Get DEXA Scan ordered (completed by Dr. Etter Sjogren)  Patient Self Care Activities:  . Patient verbalizes understanding of plan to follow as described above, Self administers medications as prescribed, Calls pharmacy for medication refills, and Calls provider office for new concerns or questions . Hypertension o Over the next 90 days patient will check blood pressure 2-3 times per week and record readings . Osteoporosis o Over the next 180 days patient will get DEXA Scan o Over the next 180 days patient will patient will restart Prolia injections for osteoporosis . Health Maintenance  o Over the next 180 days patient will complete Shingrix vaccine series o Over the next 180 days patient will complete COVID vaccine series if agreeable  Please see past updates related to this goal by clicking on the "Past Updates" button in the selected goal        Social Hx Husband. 5 children (4 adopted (1 deceased), 1 biological) 2 god-children. 3 grandchildren and oldest Daughter lives in [place], [place]. Brother lives in [place], [place].   Gets her hair done every Friday.   PAP Meds = ~ 1 month supply remaining  Morning meds in ziploc bag PM meds in ziploc bag PRN meds on kitchen counter Takes morning medicine between 7-8am Takes night medicine around 9-10pm    COPD / MAI / Tobacco   Last spirometry score:  10/10/2011-pulmonary function test-FVC 1.47 (54% predicted), postbronchodilator ratio 64, postbronchodilator FEV1 1.15 (59% predicted), positive bronchodilator response, DLCO 5.3 (35% predicted)  Gold Grade: Gold 2 (FEV1 50-79%) Current COPD Classification:  B (high sx, <2 exacerbations/yr)  Eosinophil count:   Lab Results  Component Value Date/Time   EOSPCT 3 07/27/2019 11:30 AM   EOSPCT 1.6 04/01/2009 10:48 AM   EOSPCT 0.8 05/01/2007 08:45 AM  %                               Eos (Absolute):  Lab Results  Component Value  Date/Time   EOSABS 0.1 07/27/2019 11:30 AM   EOSABS 0.1 04/01/2009 10:48 AM    Tobacco Status:  Social History   Tobacco Use  Smoking Status Former Smoker  . Packs/Travelle Mcclimans: 1.00  . Years: 20.00  . Pack years: 20.00  . Quit date: 03/19/1986  . Years since quitting: 33.3  Smokeless Tobacco Never Used    Patient has failed these meds in past: None noted (Spiriva changed to [employer]) Patient is currently controlled on the following medications: incruse ellipta 62.18m/inh once daily, proventil  Using maintenance inhaler regularly? Yes Frequency of rescue inhaler use:  daily after supper  Pt reports that her lung CT showed bacteria in lungs. Has televisit with Dr. Halford Chessman to come up with plan  From 07/01/19 CCM Visit Will take antibiotics for 1.5 years to 2 years for MAI Has pill box for regimen outlined Met with pharmacist in ID office   Update Was supposed to go to ID appt yesterday, but did not due to the weather. She was told to stop the MAI abx since her white count was trending down. Rescheduled to go back June 15 for ID follow up. States she is waiting until this appt to resume abx therapy. She is only taking the abx for her UTI (cephalexin 579m)  Plan -Continue current medications    Hypertension   BP today is:  Unable to assess due to phone visit  Office blood pressures are  BP Readings from Last 3 Encounters:  07/27/19 (!) 122/54  07/27/19 (!) 122/54  07/23/19 102/61    Patient has failed these meds in the past: None noted  Patient is currently uncontrolled on the following medications: amlodipine 2.572mdaily PM, carvedilol 2571mID (taking daily at bedtime)  Patient checks BP at home infrequently   Denies dizziness, lightheadedness  From 07/01/19 CCM Visit Carvedilol: Currently taking 1 tab at night, no longer taking the half tab in the morning Has not been checking BP at home Wants to reduce BP medications Dr. HocPercival Spanishgeneral [facility]re[facility] surgery  Update She is not checking home BP due to all that is going on with her health. Understanding of this and encouraged patient to contact me if there is anything I can do to assist.   Plan -Continue current medications     Osteoporosis   03/17/15: DEXA = Osteoporosis T-Scores Lumbar spine: -4.3 R femoral neck: -3.2 L femoral neck: -3.3  Patient has failed these meds in past: None noted Patient is currently uncontrolled on the following medications: Prolia 23m23m months  From 07/01/19 CCM Visit Pt reports she has not had injection in over a year, but is interested in restarting therapy  Update She would like to wait until things are resolved or more steady with her MAI treatment before completing DEXA scan. Agreeable with this  Plan -Complete DEXA scan after meeting with ID and resuming abx regimen for MAI -Restart Prolia Injections  Pain    Patient has failed these meds in past: None noted  Patient is currently controlled on the following medications: gabapentin 600mg85mimes daily, oxycodone 10-325mg 67mmes daily, tizanidine 4mg TI92monly takes BID), Acetaminophen 325 2 tabs PRN - uses every other Elgar Scoggins  Patient is concerned with there being confusion with the oxycodone scripts sent in from her ED visits vs the usual supply she receives from Dr. Lowne. Etter [facility]as [facility] not [facility] those [facility].  Encouraged pt to ask [facility] to cancel her current oxy scripts to not interfere with prescribing from Dr. Lowne  Etter Sjogren-Continue current medications   Miscellaneous Meds Triamcinolone ointment - rarely uses (for psoriasis)

## 2019-07-31 ENCOUNTER — Other Ambulatory Visit: Payer: Self-pay

## 2019-07-31 DIAGNOSIS — R52 Pain, unspecified: Secondary | ICD-10-CM

## 2019-07-31 NOTE — Progress Notes (Signed)
Wound recheck. Area of device with bruising,skin purple in color, soft to touch, able to palpate outline of device under skin, decreased edema from previous visit. Dr Caryl Comes given report on wound assessment. Patient to resume Eliquis tonight. Patient will contact office if she has any change in appearance of pacemaker site or any s/sx of infection.

## 2019-07-31 NOTE — Telephone Encounter (Incomplete)
Patient called in to get a prescription refill for  oxyCODONE-acetaminophen (PERCOCET/ROXICET) 5-325 MG tablet [[id number]] ENDED   Please send it to

## 2019-08-03 MED ORDER — OXYCODONE-ACETAMINOPHEN 5-325 MG PO TABS: 1.0000 | ORAL_TABLET | Freq: Four times a day (QID) | ORAL | 0 refills | Status: AC | PRN

## 2019-08-03 NOTE — Telephone Encounter (Signed)
Requesting: Percocet Contract: 11/06/2018 UDS: 11/06/2018 Last OV: 05/07/2019 Next OV: N/A Last Refill:  07/23/2019, #20--0 RF Database:   Please advise

## 2019-08-03 NOTE — Telephone Encounter (Signed)
Patient called again to make sure her request was sent.

## 2019-08-04 ENCOUNTER — Other Ambulatory Visit: Payer: Self-pay

## 2019-08-04 ENCOUNTER — Ambulatory Visit (INDEPENDENT_AMBULATORY_CARE_PROVIDER_SITE_OTHER): Payer: HMO | Admitting: Emergency Medicine

## 2019-08-04 ENCOUNTER — Encounter: Payer: Self-pay | Admitting: Family Medicine

## 2019-08-04 DIAGNOSIS — I428 Other cardiomyopathies: Secondary | ICD-10-CM

## 2019-08-04 DIAGNOSIS — I495 Sick sinus syndrome: Secondary | ICD-10-CM

## 2019-08-04 DIAGNOSIS — Z950 Presence of cardiac pacemaker: Secondary | ICD-10-CM

## 2019-08-04 MED ORDER — OXYCODONE-ACETAMINOPHEN 10-325 MG PO TABS: 1.0000 | ORAL_TABLET | ORAL | 0 refills | Status: DC | PRN

## 2019-08-04 NOTE — Progress Notes (Signed)
Hematoma reassessment in clinic with Dr. Caryl Comes. Minimal diffuse edema noted. Patient resumed Eliquis on 07/28/19 as instructed. Per Dr. Caryl Comes, plan to keep appointment on 09/22/19 as scheduled. Patient aware to call in the interim with any wound concerns.

## 2019-08-04 NOTE — Telephone Encounter (Signed)
Patient states that the wrong dosage of medication was sent in to [facility]. Patient states that she takes 10-325 MG  oxyCODONE-acetaminophen (PERCOCET/ROXICET) 5-325 MG tablet [[id number]]    Please Advise

## 2019-08-04 NOTE — Telephone Encounter (Signed)
Called back , requesting status of prescription

## 2019-08-05 ENCOUNTER — Other Ambulatory Visit: Payer: Self-pay

## 2019-08-05 ENCOUNTER — Other Ambulatory Visit: Payer: Self-pay | Admitting: Pharmacy Technician

## 2019-08-05 NOTE — Patient Outreach (Signed)
[facility]) Care Management  08/05/2019  ELAYSHA BEVARD [birth year] [phone]   Successful call placed to patient regarding patient assistance medication delivery of Proventil HFA from [place], HIPAA identifiers verified.   Patient confirms receiving the Proventil from [employer]. Discussed refill procedure with patient which will require the patient to call [facility], to order another supply. Informed patient to do this when she has approximately 2 weeks supply remaining to avoid a delay in therapy. Patient verbalized understanding. Patient confirmed having name and number as she had no other questions relating to patient assistance.  Follow up:  Will remove myself from care team as patient assistance is completed.  Tarris Delbene P. Amando Chaput, [place]  [phone]

## 2019-08-05 NOTE — Patient Outreach (Signed)
  [facility]) Care Management Chronic Special Needs Program   08/05/2019  Name: [name], DOB: October 17, 1945  MRN: [id number]  The client was discussed in today's interdisciplinary care team meeting.  The following issues were discussed:  Client's needs, Changes in health status, Coordination of care and Care transitions  Participants present:   Thea Silversmith, MSN, RN, CCM   Lorrinda Ramstad RN,BSN,CCM, CDE  Quinn Plowman RN, BSN, CCM Jacqlyn Larsen RN, BSN, CCM Maryella Shivers, MD  Karrie Meres PharmD, RPh Bary Castilla, RN, BSN, MS, CCM Coralie Carpen, MD Cherre Huger, MD [facility]/CMAA Roney Mans RD, LDN [facility] Karma Lew RN Teresa Pelton RN, BSN  Recommendations:  [facility] to continue to follow client   Plan:  RNCM to coordinate with [place] as indicated  Follow-up:  As scheduled per tier level  Peter Garter RN, [name], [place] Management Coordinator [facility] Care Management [phone]

## 2019-08-07 ENCOUNTER — Encounter: Payer: Self-pay | Admitting: Family Medicine

## 2019-08-07 ENCOUNTER — Other Ambulatory Visit: Payer: Self-pay

## 2019-08-07 ENCOUNTER — Ambulatory Visit (INDEPENDENT_AMBULATORY_CARE_PROVIDER_SITE_OTHER): Payer: HMO | Admitting: Family Medicine

## 2019-08-07 VITALS — Ht 60.0 in | Wt 90.0 lb

## 2019-08-07 DIAGNOSIS — J9611 Chronic respiratory failure with hypoxia: Secondary | ICD-10-CM

## 2019-08-07 DIAGNOSIS — R52 Pain, unspecified: Secondary | ICD-10-CM | POA: Diagnosis not present

## 2019-08-07 DIAGNOSIS — T148XXA Other injury of unspecified body region, initial encounter: Secondary | ICD-10-CM

## 2019-08-07 DIAGNOSIS — I5032 Chronic diastolic (congestive) heart failure: Secondary | ICD-10-CM | POA: Diagnosis not present

## 2019-08-07 DIAGNOSIS — G894 Chronic pain syndrome: Secondary | ICD-10-CM

## 2019-08-07 DIAGNOSIS — I10 Essential (primary) hypertension: Secondary | ICD-10-CM | POA: Diagnosis not present

## 2019-08-07 DIAGNOSIS — R5082 Postprocedural fever: Secondary | ICD-10-CM | POA: Diagnosis not present

## 2019-08-07 DIAGNOSIS — D631 Anemia in chronic kidney disease: Secondary | ICD-10-CM | POA: Diagnosis not present

## 2019-08-07 DIAGNOSIS — E785 Hyperlipidemia, unspecified: Secondary | ICD-10-CM

## 2019-08-07 MED ORDER — TIZANIDINE HCL 4 MG PO TABS: 4.0000 mg | ORAL_TABLET | Freq: Three times a day (TID) | ORAL | 1 refills | Status: DC | PRN

## 2019-08-07 NOTE — Progress Notes (Signed)
Virtual Visit via Video Note  I connected with [name] on 08/07/19 at 11:20 AM EDT by a video enabled telemedicine application and verified that I am speaking with the correct person using two identifiers.  Location: Patient: home  Provider: office    I discussed the limitations of evaluation and management by telemedicine and the availability of in person appointments. The patient expressed understanding and agreed to proceed.  History of Present Illness: Pt is home f/u from er ---  Admitted 5/3-5/6 for fever and hematoma after pacemaker generator replaced.   Pt was on po abx in hosp and d/c with keflex.  She has already seen Dr Marin Olp and Dr Caryl Comes for f/u  Pt is slowly feeling herself again .   No new complaints    Observations/Objective: There were no vitals filed for this visit. Afebrile   Wt 90 lbs   Assessment and Plan: 1. Pain Stable  Refill med - tiZANidine (ZANAFLEX) 4 MG tablet; Take 1 tablet (4 mg total) by mouth 3 (three) times daily as needed. for muscle spams  Dispense: 90 tablet; Refill: 1  2. Chronic hypoxemic respiratory failure (HCC) On O2   3. Chronic diastolic CHF (congestive heart failure) ([place]) Per cardiolgy   4. Chronic pain syndrome Stable No refills needed now   5. Erythropoietin deficiency anemia Per hematology  Lab Results  Component Value Date   WBC 5.2 07/27/2019   HGB 9.3 (L) 07/27/2019   HCT 31.5 (L) 07/27/2019   MCV 92.4 07/27/2019   PLT 259 07/27/2019     6. Essential hypertension Well controlled, no changes to meds. Encouraged heart healthy diet such as the DASH diet and exercise as tolerated.  Pt states bp has been fine   7. Hyperlipidemia LDL goal <100 Encouraged heart healthy diet, increase exercise, avoid trans fats, consider a krill oil cap daily   8. Hematoma Cardiology f/u with pt  Resolving per pt   9. Post-procedural fever Resolved---  Finish abx per hospital  F/u ID per hosp d/c    Follow Up  Instructions:    I discussed the assessment and treatment plan with the patient. The patient was provided an opportunity to ask questions and all were answered. The patient agreed with the plan and demonstrated an understanding of the instructions.   The patient was advised to call back or seek an in-person evaluation if the symptoms worsen or if the condition fails to improve as anticipated.  I provided 30 minutes of non-face-to-face time during this encounter.   Ann Held, DO

## 2019-08-07 NOTE — Assessment & Plan Note (Signed)
Stable Pt does not need refill now except muscle relaxers

## 2019-08-07 NOTE — Assessment & Plan Note (Signed)
Per cardiology 

## 2019-08-07 NOTE — Assessment & Plan Note (Signed)
On home O2 Stable

## 2019-08-07 NOTE — Progress Notes (Deleted)
Patient ID: [name], female    DOB: 1946/02/22  Age: 74 y.o. MRN: [id number]    Subjective:  Subjective  HPI [name] presents for hosp f/u for fever and hematoma around pace maker.  Pt had pacemaker generator replaced 4/2 and she developed a hematoma and then fevers developed -- -up to 103.      Pt was placed on PO abx in hospital and d/c 5/6 -- she was d/c on keflex and has already seen Dr Caryl Comes and Dr Marin Olp.    Pt is feeling tired but is slowly getting back to herself.     Review of Systems  Constitutional: Negative for appetite change, diaphoresis, fatigue and unexpected weight change.  Eyes: Negative for pain, redness and visual disturbance.  Respiratory: Negative for cough, chest tightness, shortness of breath and wheezing.   Cardiovascular: Negative for chest pain, palpitations and leg swelling.  Endocrine: Negative for cold intolerance, heat intolerance, polydipsia, polyphagia and polyuria.  Genitourinary: Negative for difficulty urinating, dysuria and frequency.  Musculoskeletal: Positive for back pain.  Neurological: Negative for dizziness, light-headedness, numbness and headaches.    History Past Medical History:  Diagnosis Date  . Anemia   . Arthritis   . Atrial fibrillation ([place])   . CAD (coronary artery disease)    Mild disease per cath in 2011  . COPD (chronic obstructive pulmonary disease) ([place])    Home O2  . Diverticulitis   . Elbow fracture, left aug 2012  . Erythropoietin deficiency anemia 06/19/2018  . Essential hypertension   . Fibromyalgia   . Gastroparesis   . GERD (gastroesophageal reflux disease)   . H/O: GI bleed    from Pradaxa  . Hepatitis    Hx of in high school   . History of pneumonia    Recurrent  . Hx MRSA infection   . Hx of gastric ulcer   . Hx of stress fx 10/2009   Right hip   . Iron malabsorption 08/14/2017  . LBBB (left bundle branch block)   . Oxygen dependent    2 liters via nasal cannula at all times  . Pacemaker     CRT therapy; followed by Dr. Caryl Comes  . Pleural effusion     s/p right thoracentesis 03/09  . Primary dilated cardiomyopathy (HCC)    EF 45 to 50% per echo in Jan 2012  . RLS (restless legs syndrome)   . Silent aspiration   . Small bowel obstruction ([place])   . Urinary incontinence   . 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 Vitamin B12 deficiency   . Wears glasses    Reading    She has a past surgical history that includes Appendectomy; pacemaker placement (12/21/2009); Peg removed; Tonsillectomy (age 49); Interstim implant revision (03/06/2011); Portacath placement (12/16/2009); Total abdominal hysterectomy; Interstim Implant placement (05/28/2006 - stage I); Interstim implant revision (10/23/2007); Cystoscopy with injection (04/30/2006); Small intestine surgery (05/20/2001); Cardiac catheterization (04/08/2006, 11/16/2009); Cystoscopy (11/11/2008); Cystoscopy w/ retrogrades (11/11/2008); Colectomy; Colectomy (02/04/2000); Gastrocutaneous fistula closure; Exploratory laparotomy (04/27/2009); Port-a-cath removal (07/27/2011); Total hip arthroplasty (08/03/2011); Interstim Implant placement (02/05/2012); Esophagogastroduodenoscopy (N/A, 02/02/2013); Savory dilation (N/A, 02/02/2013); ERCP (N/A, 06/23/2017); ERCP (N/A, 06/24/2017); Cholecystectomy (N/A, 06/27/2017); IR IMAGING GUIDED PORT INSERTION (10/14/2017); Video bronchoscopy (N/A, 05/25/2019); Bronchial washings (05/25/2019); BIV PACEMAKER GENERATOR CHANGEOUT (N/A, 06/19/2019); and TEE without cardioversion (N/A, 07/17/2019).   Her family history includes Breast cancer in her maternal aunt; Diabetes in her paternal grandfather;  Heart disease in her brother, father, mother, and paternal aunt; Stroke in her mother.She reports that she quit smoking about 33 years ago. She has a 20.00 pack-year smoking history. She has never used smokeless tobacco. She reports that she does not drink alcohol or use drugs.  Current  Outpatient Medications on File Prior to Visit  Medication Sig Dispense Refill  . acetaminophen (TYLENOL) 650 MG CR tablet Take 650 mg by mouth every 8 (eight) hours as needed for pain.    [name] albuterol (PROVENTIL HFA) 108 (90 Base) MCG/ACT inhaler Inhale 2 puffs into the lungs every 6 (six) hours as needed for wheezing or shortness of breath.    [name] amLODipine (NORVASC) 2.5 MG tablet Take 1 tablet (2.5 mg total) by mouth daily. (Patient taking differently: Take 2.5 mg by mouth at bedtime. ) 90 tablet 3  . apixaban (ELIQUIS) 5 MG TABS tablet Take 5 mg by mouth 2 (two) times daily.    [name] atorvastatin (LIPITOR) 20 MG tablet TAKE 1 TABLET BY MOUTH ONCE DAILY AT  6PM (Patient taking differently: Take 20 mg by mouth every evening. ) 90 tablet 1  . carvedilol (COREG) 25 MG tablet Take 1 tablet (25 mg total) by mouth 2 (two) times daily. (Patient taking differently: Take 25 mg by mouth at bedtime. ) 180 tablet 0  . denosumab (PROLIA) 60 MG/ML SOSY injection Inject 60 mg into the skin every 6 (six) months.    . furosemide (LASIX) 40 MG tablet TAKE 1 TABLET BY MOUTH IN THE MORNING (Patient taking differently: Take 40 mg by mouth in the morning. ) 90 tablet 2  . gabapentin (NEURONTIN) 600 MG tablet Take 1 tablet by mouth 4 times daily (Patient taking differently: Take 600 mg by mouth 4 (four) times daily. ) 120 tablet 1  . lidocaine-prilocaine (EMLA) cream Apply 1 application topically as needed. (Patient taking differently: Apply 1 application topically as needed (for port access- to numb). ) 30 g 6  . nitroGLYCERIN (NITROSTAT) 0.4 MG SL tablet Place 1 tablet (0.4 mg total) under the tongue every 5 (five) minutes as needed. CHEST PAIN (Patient taking differently: Place 0.4 mg under the tongue every 5 (five) minutes as needed for chest pain. ) 25 tablet 6  . oxyCODONE-acetaminophen (PERCOCET) 10-325 MG tablet Take 1 tablet by mouth every 4 (four) hours as needed. PAIN 120 tablet 0  . oxyCODONE-acetaminophen  (PERCOCET/ROXICET) 5-325 MG tablet Take 1 tablet by mouth every 6 (six) hours as needed for up to 5 days for severe pain. 20 tablet 0  . OXYGEN Inhale 3 L/min into the lungs continuous.     . pantoprazole (PROTONIX) 40 MG tablet Take 1 tablet by mouth twice daily (Patient taking differently: Take 40 mg by mouth 2 (two) times daily. ) 180 tablet 0  . tiZANidine (ZANAFLEX) 4 MG tablet Take 1 tablet (4 mg total) by mouth 3 (three) times daily as needed. for muscle spams (Patient taking differently: Take 4 mg by mouth 3 (three) times daily as needed for muscle spasms. ) 90 tablet 1  . triamcinolone ointment (KENALOG) 0.1 % Apply 1 application topically daily as needed (to affected areas- for psoriasis).     [name] umeclidinium bromide (INCRUSE ELLIPTA) 62.5 MCG/INH AEPB Inhale 1 puff into the lungs daily. 1 each 12  . zolpidem (AMBIEN) 5 MG tablet TAKE 1 TABLET BY MOUTH EVERY DAY AT BEDTIME AS NEEDED (Patient taking differently: Take 5 mg by mouth at bedtime. ) 30 tablet 0  . [  DISCONTINUED] iron dextran complex (INFED) 50 MG/ML injection Please give infed infusion (no test dose needed) over 4 hours per pharmacy calculated dose. Ht: 5'2 Wt: 99 pounds Hgb: 12 1 mL 0   No current facility-administered medications on file prior to visit.     Objective:  Objective  Physical Exam Ht 5' (1.524 m)   Wt 90 lb (40.8 kg)   BMI 17.58 kg/m  Wt Readings from Last 3 Encounters:  08/07/19 90 lb (40.8 kg)  07/27/19 91 lb (41.3 kg)  07/27/19 89 lb (40.4 kg)     Lab Results  Component Value Date   WBC 5.2 07/27/2019   HGB 9.3 (L) 07/27/2019   HCT 31.5 (L) 07/27/2019   PLT 259 07/27/2019   GLUCOSE 120 (H) 07/27/2019   CHOL 102 05/11/2019   TRIG 97 05/11/2019   HDL 29 (L) 05/11/2019   LDLCALC 54 05/11/2019   ALT 46 (H) 07/27/2019   AST 35 07/27/2019   NA 142 07/27/2019   K 4.3 07/27/2019   CL 104 07/27/2019   CREATININE 0.83 07/27/2019   BUN 17 07/27/2019   CO2 33 (H) 07/27/2019   TSH 7.708 (H)  07/20/2019   INR 1.1 07/15/2019   HGBA1C 5.2 05/31/2017    DG CHEST PORT 1 VIEW  Result Date: 07/20/2019 CLINICAL DATA:  Fever EXAM: PORTABLE CHEST 1 VIEW COMPARISON:  07/15/2019 FINDINGS: Right Port-A-Cath and left pacer remain in place, unchanged. Heart is normal size. Chronic nodular opacities within the lungs bilaterally, particularly upper lobes, unchanged. No effusions. No acute bony abnormality. IMPRESSION: Stable nodular opacities in the lungs, particularly upper lobes. This likely reflects previously seen mycobacterium avium infection changes. Electronically Signed   By: Rolm Baptise M.D.   On: 07/20/2019 20:39     Assessment & Plan:  Plan  I am having [name] maintain her denosumab, lidocaine-prilocaine, triamcinolone ointment, amLODipine, Incruse Ellipta, carvedilol, acetaminophen, furosemide, tiZANidine, atorvastatin, OXYGEN, nitroGLYCERIN, zolpidem, albuterol, pantoprazole, gabapentin, oxyCODONE-acetaminophen, oxyCODONE-acetaminophen, and apixaban.  No orders of the defined types were placed in this encounter.   Problem List Items Addressed This Visit    None      Follow-up: No follow-ups on file.  Ann Held, DO

## 2019-08-07 NOTE — Assessment & Plan Note (Signed)
Per hematology  Lab Results  Component Value Date   WBC 5.2 07/27/2019   HGB 9.3 (L) 07/27/2019   HCT 31.5 (L) 07/27/2019   MCV 92.4 07/27/2019   PLT 259 07/27/2019

## 2019-08-07 NOTE — Assessment & Plan Note (Signed)
Well controlled, no changes to meds. Encouraged heart healthy diet such as the DASH diet and exercise as tolerated.  °

## 2019-08-07 NOTE — Assessment & Plan Note (Signed)
Encouraged heart healthy diet, increase exercise, avoid trans fats, consider a krill oil cap daily 

## 2019-08-11 DIAGNOSIS — I4891 Unspecified atrial fibrillation: Secondary | ICD-10-CM | POA: Diagnosis not present

## 2019-08-11 DIAGNOSIS — J449 Chronic obstructive pulmonary disease, unspecified: Secondary | ICD-10-CM | POA: Diagnosis not present

## 2019-08-17 DIAGNOSIS — J449 Chronic obstructive pulmonary disease, unspecified: Secondary | ICD-10-CM | POA: Diagnosis not present

## 2019-08-17 DIAGNOSIS — I4891 Unspecified atrial fibrillation: Secondary | ICD-10-CM | POA: Diagnosis not present

## 2019-08-18 ENCOUNTER — Inpatient Hospital Stay: Payer: HMO

## 2019-08-18 ENCOUNTER — Inpatient Hospital Stay: Payer: HMO | Attending: Family

## 2019-08-18 ENCOUNTER — Telehealth: Payer: Self-pay | Admitting: Family

## 2019-08-18 ENCOUNTER — Other Ambulatory Visit: Payer: Self-pay

## 2019-08-18 ENCOUNTER — Inpatient Hospital Stay (HOSPITAL_BASED_OUTPATIENT_CLINIC_OR_DEPARTMENT_OTHER): Payer: HMO | Admitting: Family

## 2019-08-18 ENCOUNTER — Encounter: Payer: Self-pay | Admitting: Family

## 2019-08-18 VITALS — BP 147/58 | HR 72 | Temp 97.3°F | Resp 18 | Ht 60.0 in | Wt 89.0 lb

## 2019-08-18 DIAGNOSIS — D631 Anemia in chronic kidney disease: Secondary | ICD-10-CM | POA: Diagnosis not present

## 2019-08-18 DIAGNOSIS — Z881 Allergy status to other antibiotic agents status: Secondary | ICD-10-CM | POA: Insufficient documentation

## 2019-08-18 DIAGNOSIS — Z9981 Dependence on supplemental oxygen: Secondary | ICD-10-CM | POA: Insufficient documentation

## 2019-08-18 DIAGNOSIS — R0602 Shortness of breath: Secondary | ICD-10-CM | POA: Diagnosis not present

## 2019-08-18 DIAGNOSIS — N189 Chronic kidney disease, unspecified: Secondary | ICD-10-CM | POA: Diagnosis not present

## 2019-08-18 DIAGNOSIS — D500 Iron deficiency anemia secondary to blood loss (chronic): Secondary | ICD-10-CM | POA: Diagnosis not present

## 2019-08-18 DIAGNOSIS — Z79899 Other long term (current) drug therapy: Secondary | ICD-10-CM | POA: Insufficient documentation

## 2019-08-18 DIAGNOSIS — G629 Polyneuropathy, unspecified: Secondary | ICD-10-CM | POA: Diagnosis not present

## 2019-08-18 DIAGNOSIS — K909 Intestinal malabsorption, unspecified: Secondary | ICD-10-CM | POA: Insufficient documentation

## 2019-08-18 DIAGNOSIS — Z95828 Presence of other vascular implants and grafts: Secondary | ICD-10-CM

## 2019-08-18 DIAGNOSIS — Z7901 Long term (current) use of anticoagulants: Secondary | ICD-10-CM | POA: Insufficient documentation

## 2019-08-18 DIAGNOSIS — Z880 Allergy status to penicillin: Secondary | ICD-10-CM | POA: Diagnosis not present

## 2019-08-18 LAB — IRON AND TIBC
Iron: 70 ug/dL (ref 28–170)
Saturation Ratios: 28 % (ref 10.4–31.8)
TIBC: 250 ug/dL (ref 250–450)
UIBC: 180 ug/dL

## 2019-08-18 LAB — CBC WITH DIFFERENTIAL (CANCER CENTER ONLY)
Abs Immature Granulocytes: 0.02 10*3/uL (ref 0.00–0.07)
Basophils Absolute: 0.1 10*3/uL (ref 0.0–0.1)
Basophils Relative: 1 %
Eosinophils Absolute: 0.1 10*3/uL (ref 0.0–0.5)
Eosinophils Relative: 2 %
HCT: 36.2 % (ref 36.0–46.0)
Hemoglobin: 11.1 g/dL — ABNORMAL LOW (ref 12.0–15.0)
Immature Granulocytes: 0 %
Lymphocytes Relative: 10 %
Lymphs Abs: 0.6 10*3/uL — ABNORMAL LOW (ref 0.7–4.0)
MCH: 27.9 pg (ref 26.0–34.0)
MCHC: 30.7 g/dL (ref 30.0–36.0)
MCV: 91 fL (ref 80.0–100.0)
Monocytes Absolute: 0.5 10*3/uL (ref 0.1–1.0)
Monocytes Relative: 9 %
Neutro Abs: 4.8 10*3/uL (ref 1.7–7.7)
Neutrophils Relative %: 78 %
Platelet Count: 231 10*3/uL (ref 150–400)
RBC: 3.98 MIL/uL (ref 3.87–5.11)
RDW: 15.6 % — ABNORMAL HIGH (ref 11.5–15.5)
WBC Count: 6.1 10*3/uL (ref 4.0–10.5)
nRBC: 0 % (ref 0.0–0.2)

## 2019-08-18 LAB — RETICULOCYTES
Immature Retic Fract: 9.7 % (ref 2.3–15.9)
RBC.: 3.99 MIL/uL (ref 3.87–5.11)
Retic Count, Absolute: 33.1 10*3/uL (ref 19.0–186.0)
Retic Ct Pct: 0.8 % (ref 0.4–3.1)

## 2019-08-18 LAB — CMP (CANCER CENTER ONLY)
ALT: 7 U/L (ref 0–44)
AST: 17 U/L (ref 15–41)
Albumin: 4.1 g/dL (ref 3.5–5.0)
Alkaline Phosphatase: 124 U/L (ref 38–126)
Anion gap: 8 (ref 5–15)
BUN: 16 mg/dL (ref 8–23)
CO2: 31 mmol/L (ref 22–32)
Calcium: 8.6 mg/dL — ABNORMAL LOW (ref 8.9–10.3)
Chloride: 99 mmol/L (ref 98–111)
Creatinine: 0.9 mg/dL (ref 0.44–1.00)
GFR, Est AFR Am: >60 mL/min (ref >60)
GFR, Estimated: >60 mL/min (ref >60)
Glucose, Bld: 105 mg/dL — ABNORMAL HIGH (ref 70–99)
Potassium: 4.2 mmol/L (ref 3.5–5.1)
Sodium: 138 mmol/L (ref 135–145)
Total Bilirubin: 0.5 mg/dL (ref 0.3–1.2)
Total Protein: 7.3 g/dL (ref 6.5–8.1)

## 2019-08-18 LAB — FERRITIN: Ferritin: 645 ng/mL — ABNORMAL HIGH (ref 11–307)

## 2019-08-18 MED ORDER — HEPARIN SOD (PORK) LOCK FLUSH 100 UNIT/ML IV SOLN
500.0000 [IU] | Freq: Once | INTRAVENOUS | Status: AC
  Administered 2019-08-18: 500 [IU] via INTRAVENOUS
  Filled 2019-08-18: qty 5

## 2019-08-18 MED ORDER — SODIUM CHLORIDE 0.9% FLUSH
10.0000 mL | Freq: Once | INTRAVENOUS | Status: AC
  Administered 2019-08-18: 10 mL via INTRAVENOUS
  Filled 2019-08-18: qty 10

## 2019-08-18 NOTE — Progress Notes (Signed)
Hematology and Oncology Follow Up Visit  [name] [id number] 10-23-1945 74 y.o. 08/18/2019   Principle Diagnosis:  Iron deficiency anemia Erythropoietin deficient anemia  Current Therapy: IV iron as indicated Retacrit 40,000 units SQfor hgb <11   Interim History:  [name] is here today for follow-up. She is doing well and has no complaints at this time.  Hgb today is 11.1!. She states that she is feeling good.  No episodes of bleeding. No petechiae. The skin of her arms is thin and she does bruise easily.  SOB is stable on 3L supplemental O2 24 hours a day.  No fever, chills, n/v, cough, rash, dizziness, chest pain, palpitations, abdominal pain or changes in bowel or bladder habits.  No swelling or tenderness in her extremities.  The neuropathy in her hands and feet is unchanged.  No falls or syncopal episodes to report. She is ambulating well with a cane for added support.  She has maintained a good appetite and is staying well hydrated. Her weight is stable.   ECOG Performance Status: 1 - Symptomatic but completely ambulatory  Medications:  Allergies as of 08/18/2019      Reactions   Dabigatran Etexilate Mesylate Other (See Comments)   INTERNAL BLEEDING- Pradaxa   Augmentin [amoxicillin-pot Clavulanate] Diarrhea, Nausea Only   Did it involve swelling of the face/tongue/throat, SOB, or low BP? No Did it involve sudden or severe rash/hives, skin peeling, or any reaction on the inside of your mouth or nose? No Did you need to seek medical attention at a hospital or doctor's office? No When did it last happen?02/2019 If all above answers are "NO", may proceed with cephalosporin use.   Talwin [pentazocine] Other (See Comments)   Hallucinations      Medication List       Accurate as of August 18, 2019 11:32 AM. If you have any questions, ask your nurse or doctor.        acetaminophen 650 MG CR tablet Commonly known as: TYLENOL Take 650 mg by mouth every  8 (eight) hours as needed for pain.   amLODipine 2.5 MG tablet Commonly known as: NORVASC Take 1 tablet (2.5 mg total) by mouth daily. What changed: when to take this   atorvastatin 20 MG tablet Commonly known as: LIPITOR TAKE 1 TABLET BY MOUTH ONCE DAILY AT  6PM What changed:   how much to take  how to take this  when to take this  additional instructions   carvedilol 25 MG tablet Commonly known as: COREG Take 1 tablet (25 mg total) by mouth 2 (two) times daily. What changed: when to take this   denosumab 60 MG/ML Sosy injection Commonly known as: PROLIA Inject 60 mg into the skin every 6 (six) months.   Eliquis 5 MG Tabs tablet Generic drug: apixaban Take 5 mg by mouth 2 (two) times daily.   furosemide 40 MG tablet Commonly known as: LASIX TAKE 1 TABLET BY MOUTH IN THE MORNING What changed: when to take this   gabapentin 600 MG tablet Commonly known as: NEURONTIN Take 1 tablet by mouth 4 times daily What changed: See the new instructions.   Incruse Ellipta 62.5 MCG/INH Aepb Generic drug: umeclidinium bromide Inhale 1 puff into the lungs daily.   lidocaine-prilocaine cream Commonly known as: EMLA Apply 1 application topically as needed. What changed: reasons to take this   nitroGLYCERIN 0.4 MG SL tablet Commonly known as: NITROSTAT Place 1 tablet (0.4 mg total) under the tongue every 5 (  five) minutes as needed. CHEST PAIN What changed:   reasons to take this  additional instructions   oxyCODONE-acetaminophen 10-325 MG tablet Commonly known as: PERCOCET Take 1 tablet by mouth every 4 (four) hours as needed. PAIN   OXYGEN Inhale 3 L/min into the lungs continuous.   pantoprazole 40 MG tablet Commonly known as: PROTONIX Take 1 tablet by mouth twice daily   Proventil HFA 108 (90 Base) MCG/ACT inhaler Generic drug: albuterol Inhale 2 puffs into the lungs every 6 (six) hours as needed for wheezing or shortness of breath.   tiZANidine 4 MG tablet  Commonly known as: ZANAFLEX Take 1 tablet (4 mg total) by mouth 3 (three) times daily as needed. for muscle spams   triamcinolone ointment 0.1 % Commonly known as: KENALOG Apply 1 application topically daily as needed (to affected areas- for psoriasis).   zolpidem 5 MG tablet Commonly known as: AMBIEN TAKE 1 TABLET BY MOUTH EVERY DAY AT BEDTIME AS NEEDED What changed: See the new instructions.       Allergies:  Allergies  Allergen Reactions  . Dabigatran Etexilate Mesylate Other (See Comments)    INTERNAL BLEEDING- Pradaxa  . Augmentin [Amoxicillin-Pot Clavulanate] Diarrhea and Nausea Only    Did it involve swelling of the face/tongue/throat, SOB, or low BP? No Did it involve sudden or severe rash/hives, skin peeling, or any reaction on the inside of your mouth or nose? No Did you need to seek medical attention at a hospital or doctor's office? No When did it last happen?02/2019 If all above answers are "NO", may proceed with cephalosporin use.   [name] [Pentazocine] Other (See Comments)    Hallucinations     Past Medical History, Surgical history, Social history, and Family History were reviewed and updated.  Review of Systems: All other 10 point review of systems is negative.   Physical Exam:  vitals were not taken for this visit.   Wt Readings from Last 3 Encounters:  08/07/19 90 lb (40.8 kg)  07/27/19 91 lb (41.3 kg)  07/27/19 89 lb (40.4 kg)    Ocular: Sclerae unicteric, pupils equal, round and reactive to light Ear-nose-throat: Oropharynx clear, dentition fair Lymphatic: No cervical or supraclavicular adenopathy Lungs no rales or rhonchi, good excursion bilaterally Heart regular rate and rhythm, no murmur appreciated Abd soft, nontender, positive bowel sounds, no liver or spleen tip palpated on exam, no fluid wave  MSK no focal spinal tenderness, no joint edema Neuro: non-focal, well-oriented, appropriate affect Breasts: Deferred   Lab Results   Component Value Date   WBC 6.1 08/18/2019   HGB 11.1 (L) 08/18/2019   HCT 36.2 08/18/2019   MCV 91.0 08/18/2019   PLT 231 08/18/2019   Lab Results  Component Value Date   FERRITIN 1,794 (H) 07/27/2019   IRON 71 07/27/2019   TIBC 175 (L) 07/27/2019   UIBC 104 (L) 07/27/2019   IRONPCTSAT 41 07/27/2019   Lab Results  Component Value Date   RETICCTPCT 0.8 08/18/2019   RBC 3.99 08/18/2019   RETICCTABS 64.6 04/01/2009   No results found for: KPAFRELGTCHN, LAMBDASER, KAPLAMBRATIO No results found for: IGGSERUM, IGA, IGMSERUM No results found for: Ronnald Ramp, A1GS, Gilford Silvius, MSPIKE, SPEI   Chemistry      Component Value Date/Time   NA 142 07/27/2019 1130   NA 135 06/16/2019 1333   K 4.3 07/27/2019 1130   CL 104 07/27/2019 1130   CO2 33 (H) 07/27/2019 1130   BUN 17 07/27/2019 1130  BUN 31 (H) 06/16/2019 1333   CREATININE 0.83 07/27/2019 1130   CREATININE 0.91 03/16/2016 1539      Component Value Date/Time   CALCIUM 8.2 (L) 07/27/2019 1130   ALKPHOS 222 (H) 07/27/2019 1130   AST 35 07/27/2019 1130   ALT 46 (H) 07/27/2019 1130   BILITOT 0.3 07/27/2019 1130       Impression and Plan: Ms. Weinert is a very pleasant 74yo caucasian female with iron deficiency anemia secondary to malabsorption and possible GI blood loss with Eliquis. No Retacrit needed today for Hgb 11.1! Iron studies are pending and we will replace next week if needed. We will plan to see her again in another 3 weeks for lab/injection and follow-up in 6 weeks.  She will contact our office with any questions or concerns. We can certainly see her sooner if needed.   Laverna Peace, NP 6/1/202111:32 AM

## 2019-08-18 NOTE — Patient Instructions (Signed)
Tunneled Central Venous Catheter Flushing Guide  It is important to flush your tunneled central venous catheter each time you use it, both before and after you use it. Flushing your catheter will help prevent it from clogging. What are the risks? Risks may include:  Infection.  Air getting into the catheter and bloodstream. Supplies needed:  A clean pair of gloves.  A disinfecting wipe. Use an alcohol wipe, chlorhexidine wipe, or iodine wipe as told by your health care provider.  A 10 mL syringe that has been prefilled with saline solution.  An empty 10 mL syringe, if a substance called heparin was injected into your catheter. How to flush your catheter When you flush your catheter, make sure you follow any specific instructions from your health care provider or the manufacturer. These are general guidelines. Flushing your catheter before use If there is heparin in your catheter: 1. Wash your hands with soap and water. 2. Put on gloves. 3. Scrub the injection cap for a minimum of 15 seconds with a disinfecting wipe. 4. Unclamp the catheter. 5. Attach the empty syringe to the injection cap. 6. Pull the syringe plunger back and withdraw 10 mL of blood. 7. Place the syringe into an appropriate waste container. 8. Scrub the injection cap for 15 seconds with a disinfecting wipe. 9. Attach the prefilled syringe to the injection cap. 10. Flush the catheter by pushing the plunger forward until all the liquid from the syringe is in the catheter. 11. Remove the syringe from the injection cap. 12. Clamp the catheter. If there is no heparin in your catheter: 1. Wash your hands with soap and water. 2. Put on gloves. 3. Scrub the injection cap for 15 seconds with a disinfecting wipe. 4. Unclamp the catheter. 5. Attach the prefilled syringe to the injection cap. 6. Flush the catheter by pushing the plunger forward until 5 mL of the liquid from the syringe is in the catheter. 7. Pull back on  the syringe until you see blood in the catheter. 8. If you have been asked to collect any blood, follow your health care provider's instructions. Otherwise, flush the catheter with the rest of the solution from the syringe. 9. Remove the syringe from the injection cap. 10. Clamp the catheter.  Flushing your catheter after use 1. Wash your hands with soap and water. 2. Put on gloves. 3. Scrub the injection cap for 15 seconds with a disinfecting wipe. 4. Unclamp the catheter. 5. Attach the prefilled syringe to the injection cap. 6. Flush the catheter by pushing the plunger forward until all of the liquid from the syringe is in the catheter. 7. Remove the syringe from the injection cap. 8. Clamp the catheter. Problems and solutions  If blood cannot be completely cleared from the injection cap, you may need to have the injection cap replaced.  If the catheter is difficult to flush, use the pulsing method. The pulsing method involves pushing only a few milliliters of solution into the catheter at a time and pausing between pushes.  If you do not see blood in the catheter when you pull back on the syringe, change your body position, such as by raising your arms above your head. Take a deep breath and cough. Then, pull back on the syringe. If you still do not see blood, flush the catheter with a small amount of solution. Then, change positions again and take a breath or cough. Pull back on the syringe again. If you still do not see   blood, finish flushing the catheter and contact your health care provider. Do not use your catheter until your health care provider says it is okay. General tips  Have someone help you flush your catheter, if possible.  Do not force fluid through your catheter.  Do not use a syringe that is larger or smaller than 10 mL. Using a smaller syringe can make the catheter burst.  Do not use your catheter without flushing it first if it has heparin in it. Contact a health  care provider if:  You cannot see any blood in the catheter when you flush it before using it.  Your catheter is difficult to flush. Get help right away if:  You cannot flush the catheter.  The catheter leaks when you flush it or when there is fluid in it.  There are cracks or breaks in the catheter. Summary  It is important to flush your tunneled central venous catheter each time you use it, both before and after you use it.  Scrub the injection cap for 15 seconds with a disinfecting wipe before and after you flush it.  When you flush your catheter, make sure you follow any specific instructions from your health care provider or the manufacturer.  Get help right away if you cannot flush the catheter. This information is not intended to replace advice given to you by your health care provider. Make sure you discuss any questions you have with your health care provider. Document Revised: 11/28/2018 Document Reviewed: 05/21/2018 Elsevier Patient Education  2020 [employer]  

## 2019-08-18 NOTE — Telephone Encounter (Signed)
Appointments scheduled calendar printed & mailed per 6/1 los

## 2019-08-19 ENCOUNTER — Other Ambulatory Visit: Payer: Self-pay | Admitting: Family Medicine

## 2019-08-19 DIAGNOSIS — G4700 Insomnia, unspecified: Secondary | ICD-10-CM

## 2019-08-19 NOTE — Telephone Encounter (Signed)
Requesting: Ambien Contract: 11/06/18 UDS: 11/06/2018 Last OV: 08/07/19 Next OV: N/A Last Refill: 07/13/2019, #30--0 RF Database:   Please advise

## 2019-08-20 ENCOUNTER — Ambulatory Visit: Payer: HMO

## 2019-08-24 ENCOUNTER — Other Ambulatory Visit: Payer: Self-pay

## 2019-08-24 NOTE — Patient Outreach (Signed)
[place] [facility]) Care Management Chronic Special Needs Program  08/24/2019  Name: [name] DOB: [birth year]  MRN: [id number]  [name] is enrolled in a chronic special needs plan for Heart Failure. Reviewed and updated care plan.  Subjective: Client states that she is feeling much better and stronger.  Denies any fevers or chills.  States she is to see the infectious disease doctor on 09/08/19.  States her breathing is improved some since her hemoglobin is up some.  States she paces her activities but she has been able to do a few more things around the house.  States her appetite is improved and she tries to eat a well balanced diet.  States her weights have been stable from 89-92 lbs.  Denies any swelling.  States her defibrillator site is healed and the hematoma is gone.  Client states that she has some questions about her medication assistance for Eliquis.  Goals Addressed            This Visit's Progress   . Client understands the importance of follow-up with providers by attending scheduled visits   On track    Plan to keep scheduled appointments with providers Reinforced to keep scheduled appointments with providers    . Client will report no worsening of symptoms of Atrial Fibrillation within the next 6 months(continued 05/14/19)   On track    Educated on signs, symptoms and monitoring for worsening atrial fibrillation Reviewed atrial fibrillation action plan Reviewed importance of taking anticoagulant to prevent stroke    . Client will report weighing and recording weights daily within next 6 months(continue 05/14/19   On track    Reinforced importance of weighting daily and to record weights Reviewed heart failure zones and when to call MD    . Client will verbalize knowledge of chronic lung disease as evidenced by no ED visits or Inpatient stays related to chronic lung disease    On track    Reports no changes in COPD with no ED or hospital  visits Reviewed COPD action plan in [employer]([facility]) calendar  Sent EMMI: COPD What you can do    . Client will verbalize knowledge of self management of Hypertension as evidences by BP reading of 140/90 or less; or as defined by provider   On track    Reports very good B/P control last reading of 96/57 at provider visit Plan to check B/P regularly Take B/P medications as ordered Plan to follow a low salt diet  Increase activity as tolerated EMMI: High Blood Pressure(Hypertension) What you can do    . Decrease inpatient Heart Failure admissions/ readmissions with in the year   On track    No Heart failure admission in 2020 Signs and symptoms of heart failure reviewed Education provided for self-management of Heart Disease. Advised to notify doctor for symptoms Call 911 for severe symptoms Weigh daily and record weighs Follow a low salt diet    . COMPLETED: General - Client will not be readmitted within 30 days (C-SNP)discharged 07/23/19   On track    Discharged on 07/23/19 with diagnosis of acute febrile illness No readmission or ED visits Goal completed 08/24/19    . Maintain timely refills of Heart Failure medication as prescribed within the year    On track    Maintaining timely refills of medications per dispense report Reinforced importance of getting medications refilled on time    . Obtain annual  Lipid Profile, LDL-C   On  track    Last completed 05/11/19 LDL 54 Plan to take statin as ordered    . Patient Stated plan to walk 15 minutes 3 x a week for the next 6 months (pt-stated)   Not on track    Plan to pace your activity Plan to take your cell phone and or family member with you    . Visit Primary Care Provider or Cardiologist at least 2 times per year   On track    Primary care provider 05/07/19 Please schedule your annual wellness visit     [facility] completed last visit with client on 08/12/19 and next visit scheduled on 10/09/19  Referred back to  pharmacy to follow up on Eliquis pharmacy assistance Plan:  Send successful outreach letter with a copy of their individualized care plan and Send individual care plan to provider  Chronic care management coordinator will review [facility]'s plan of care and will collaborate with [facility] team as indicated  Chronic care management coordinator will outreach as scheduled per tier level or sooner if needed  Will refer to:  Pharmacy to follow up on previous assistance for Eliquis   Peter Garter RN, [name], [facility] [phone]

## 2019-08-24 NOTE — Patient Outreach (Signed)
Sent pharmacy assistance referral for Eliquis to [employer] through their [employer] portal. Ticket saved successfully with Tracking ID: [id number]

## 2019-08-25 ENCOUNTER — Other Ambulatory Visit: Payer: Self-pay | Admitting: Cardiology

## 2019-08-27 NOTE — Patient Outreach (Signed)
Message from [place]: Reviewed this member's claim history in [employer] and did not note any claim attempts for Eliquis in 2021, last paid claim was 06/2018. 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 I provided an email communication via secure email to colleagues at [employer] to reach out to this member regarding the medication patient assistance request. This ticket will be closed at this time. 08/26/2019 [email or web address]

## 2019-08-31 ENCOUNTER — Other Ambulatory Visit: Payer: Self-pay | Admitting: Family Medicine

## 2019-08-31 DIAGNOSIS — R52 Pain, unspecified: Secondary | ICD-10-CM

## 2019-08-31 MED ORDER — OXYCODONE-ACETAMINOPHEN 10-325 MG PO TABS: 1.0000 | ORAL_TABLET | ORAL | 0 refills | Status: DC | PRN

## 2019-08-31 NOTE — Telephone Encounter (Signed)
Requesting: Oxycodone Contract: 11/06/2018 UDS: 11/06/2018 Last OV: 08/07/2019 Next OV: N/A Last Refill: 08/04/2019, #120--0 RF Database:   Please advise

## 2019-08-31 NOTE — Telephone Encounter (Signed)
Medication: oxyCODONE-acetaminophen (PERCOCET) 10-325 MG tablet   Has the patient contacted their pharmacy? Yes.   (If no, request that the patient contact the pharmacy for the refill.) (If yes, when and what did the pharmacy advise?)  Preferred Pharmacy (with phone number or street name):   [facility], [address] [place], [place] [address]  Phone:  [phone] Fax:  [phone]   Agent: Please be advised that RX refills may take up to 3 business days. We ask that you follow-up with your pharmacy.

## 2019-09-01 ENCOUNTER — Inpatient Hospital Stay: Payer: HMO | Admitting: Internal Medicine

## 2019-09-01 ENCOUNTER — Other Ambulatory Visit: Payer: Self-pay | Admitting: Internal Medicine

## 2019-09-07 ENCOUNTER — Other Ambulatory Visit: Payer: Self-pay | Admitting: Family

## 2019-09-07 DIAGNOSIS — D631 Anemia in chronic kidney disease: Secondary | ICD-10-CM

## 2019-09-07 DIAGNOSIS — D500 Iron deficiency anemia secondary to blood loss (chronic): Secondary | ICD-10-CM

## 2019-09-08 ENCOUNTER — Inpatient Hospital Stay: Payer: HMO

## 2019-09-08 ENCOUNTER — Encounter: Payer: Self-pay | Admitting: Internal Medicine

## 2019-09-08 ENCOUNTER — Ambulatory Visit: Payer: HMO | Admitting: Internal Medicine

## 2019-09-08 ENCOUNTER — Other Ambulatory Visit: Payer: Self-pay

## 2019-09-08 VITALS — BP 119/53 | HR 65 | Temp 97.4°F | Resp 19

## 2019-09-08 VITALS — HR 77 | Temp 97.9°F | Wt 90.2 lb

## 2019-09-08 DIAGNOSIS — A310 Pulmonary mycobacterial infection: Secondary | ICD-10-CM | POA: Diagnosis not present

## 2019-09-08 DIAGNOSIS — N189 Chronic kidney disease, unspecified: Secondary | ICD-10-CM | POA: Diagnosis not present

## 2019-09-08 DIAGNOSIS — D631 Anemia in chronic kidney disease: Secondary | ICD-10-CM

## 2019-09-08 DIAGNOSIS — D500 Iron deficiency anemia secondary to blood loss (chronic): Secondary | ICD-10-CM

## 2019-09-08 LAB — CMP (CANCER CENTER ONLY)
ALT: 13 U/L (ref 0–44)
AST: 26 U/L (ref 15–41)
Albumin: 3.9 g/dL (ref 3.5–5.0)
Alkaline Phosphatase: 123 U/L (ref 38–126)
Anion gap: 5 (ref 5–15)
BUN: 17 mg/dL (ref 8–23)
CO2: 30 mmol/L (ref 22–32)
Calcium: 8.7 mg/dL — ABNORMAL LOW (ref 8.9–10.3)
Chloride: 103 mmol/L (ref 98–111)
Creatinine: 0.92 mg/dL (ref 0.44–1.00)
GFR, Est AFR Am: >60 mL/min (ref >60)
GFR, Estimated: >60 mL/min (ref >60)
Glucose, Bld: 110 mg/dL — ABNORMAL HIGH (ref 70–99)
Potassium: 4.8 mmol/L (ref 3.5–5.1)
Sodium: 138 mmol/L (ref 135–145)
Total Bilirubin: 0.4 mg/dL (ref 0.3–1.2)
Total Protein: 7.1 g/dL (ref 6.5–8.1)

## 2019-09-08 LAB — CBC WITH DIFFERENTIAL (CANCER CENTER ONLY)
Abs Immature Granulocytes: 0.03 10*3/uL (ref 0.00–0.07)
Basophils Absolute: 0 10*3/uL (ref 0.0–0.1)
Basophils Relative: 1 %
Eosinophils Absolute: 0.3 10*3/uL (ref 0.0–0.5)
Eosinophils Relative: 5 %
HCT: 31.2 % — ABNORMAL LOW (ref 36.0–46.0)
Hemoglobin: 9.6 g/dL — ABNORMAL LOW (ref 12.0–15.0)
Immature Granulocytes: 1 %
Lymphocytes Relative: 10 %
Lymphs Abs: 0.6 10*3/uL — ABNORMAL LOW (ref 0.7–4.0)
MCH: 28.3 pg (ref 26.0–34.0)
MCHC: 30.8 g/dL (ref 30.0–36.0)
MCV: 92 fL (ref 80.0–100.0)
Monocytes Absolute: 0.7 10*3/uL (ref 0.1–1.0)
Monocytes Relative: 11 %
Neutro Abs: 4.8 10*3/uL (ref 1.7–7.7)
Neutrophils Relative %: 72 %
Platelet Count: 236 10*3/uL (ref 150–400)
RBC: 3.39 MIL/uL — ABNORMAL LOW (ref 3.87–5.11)
RDW: 15.6 % — ABNORMAL HIGH (ref 11.5–15.5)
WBC Count: 6.5 10*3/uL (ref 4.0–10.5)
nRBC: 0 % (ref 0.0–0.2)

## 2019-09-08 MED ORDER — EPOETIN ALFA-EPBX 40000 UNIT/ML IJ SOLN
INTRAMUSCULAR | Status: AC
  Filled 2019-09-08: qty 1

## 2019-09-08 MED ORDER — EPOETIN ALFA-EPBX 40000 UNIT/ML IJ SOLN
40000.0000 [IU] | Freq: Once | INTRAMUSCULAR | Status: AC
  Administered 2019-09-08: 40000 [IU] via SUBCUTANEOUS

## 2019-09-08 NOTE — Progress Notes (Signed)
RFV: follow up  Patient ID: [name], female   DOB: 1945-10-20, 74 y.o.   MRN: [id number]  HPI   In early April was seen by my partner, dr comer, and started on pulm MAI treatment: [name] is here today for newly diagnosed MAI infection. After reviewing her medication list and assessing the drug interaction between rifampin and her home Eliquis for atrial fibrillation, the decision was made to start this patient on combination therapy with rifabutin 300 mg PO thrice weekly, azithromycin 500 mg PO thrice weekly, and ethambutol 1000 mg PO thrice weekly. This regimen consists of: - rifabutin 2 x 150 mg capsules on Tuesdays, Thursdays, and Saturdays - ethambutol 2 x 400 mg tablets + 2 x 100 mg tablets on Mondays, Wednesdays, Fridays - azithromycin 1 x 500 mg tablet on Mondays, Wednesdays, Fridays  Admitted in late April for new onset fevers, concern for pacemaker infection. As well as early may. Seen by dr Caryl Comes weekly to ensure fluid collection by pacemaker pocket resolved.  Gets monthly epo shot to help hgb -- on a side note  3LNC but still having some shortness of breath with activity. Uses rescue inhaler to help with symptoms       Outpatient Encounter Medications as of 09/08/2019  Medication Sig  . acetaminophen (TYLENOL) 650 MG CR tablet Take 650 mg by mouth every 8 (eight) hours as needed for pain.  [name] albuterol (PROVENTIL HFA) 108 (90 Base) MCG/ACT inhaler Inhale 2 puffs into the lungs every 6 (six) hours as needed for wheezing or shortness of breath.  [name] amLODipine (NORVASC) 2.5 MG tablet Take 1 tablet (2.5 mg total) by mouth daily. (Patient taking differently: Take 2.5 mg by mouth at bedtime. )  . apixaban (ELIQUIS) 5 MG TABS tablet Take 5 mg by mouth 2 (two) times daily.  [name] atorvastatin (LIPITOR) 20 MG tablet TAKE 1 TABLET BY MOUTH ONCE DAILY AT  6PM (Patient taking differently: Take 20 mg by mouth every evening. )  . carvedilol (COREG) 25 MG tablet Take 1 tablet (25 mg  total) by mouth at bedtime.  . furosemide (LASIX) 40 MG tablet TAKE 1 TABLET BY MOUTH IN THE MORNING (Patient taking differently: Take 40 mg by mouth in the morning. )  . gabapentin (NEURONTIN) 600 MG tablet Take 1 tablet by mouth 4 times daily (Patient taking differently: Take 600 mg by mouth 4 (four) times daily. )  . lidocaine-prilocaine (EMLA) cream Apply 1 application topically as needed. (Patient taking differently: Apply 1 application topically as needed (for port access- to numb). )  . nitrofurantoin (MACRODANTIN) 100 MG capsule Take 100 mg by mouth daily.  . nitroGLYCERIN (NITROSTAT) 0.4 MG SL tablet Place 1 tablet (0.4 mg total) under the tongue every 5 (five) minutes as needed. CHEST PAIN (Patient taking differently: Place 0.4 mg under the tongue every 5 (five) minutes as needed for chest pain. )  . oxyCODONE-acetaminophen (PERCOCET) 10-325 MG tablet Take 1 tablet by mouth every 4 (four) hours as needed. PAIN  . OXYGEN Inhale 3 L/min into the lungs continuous.   . pantoprazole (PROTONIX) 40 MG tablet Take 1 tablet by mouth twice daily (Patient taking differently: Take 40 mg by mouth 2 (two) times daily. )  . tiZANidine (ZANAFLEX) 4 MG tablet Take 1 tablet (4 mg total) by mouth 3 (three) times daily as needed. for muscle spams  . triamcinolone ointment (KENALOG) 0.1 % Apply 1 application topically daily as needed (to affected areas- for psoriasis).   [name]  umeclidinium bromide (INCRUSE ELLIPTA) 62.5 MCG/INH AEPB Inhale 1 puff into the lungs daily.  [name] zolpidem (AMBIEN) 5 MG tablet TAKE 1 TABLET BY MOUTH EVERY DAY AT BEDTIME AS NEEDED  . denosumab (PROLIA) 60 MG/ML SOSY injection Inject 60 mg into the skin every 6 (six) months. (Patient not taking: Reported on 09/08/2019)  . [DISCONTINUED] iron dextran complex (INFED) 50 MG/ML injection Please give infed infusion (no test dose needed) over 4 hours per pharmacy calculated dose. Ht: 5'2 Wt: 99 pounds Hgb: 12   No facility-administered encounter  medications on file as of 09/08/2019.     Patient Active Problem List   Diagnosis Date Noted  . Hematoma 08/07/2019  . Other neutropenia ([place])   . Post-procedural fever 07/20/2019  . Acute febrile illness 07/16/2019  . Pulmonary MAI (mycobacterium avium-intracellulare) infection ([place]) 06/24/2019  . History of total right hip replacement 05/27/2019  . NICM (nonischemic cardiomyopathy) ([place]) 05/19/2019  . Educated about COVID-19 virus infection 09/07/2018  . Erythropoietin deficiency anemia 06/19/2018  . Swelling of calf 02/04/2018  . Bad odor of urine 12/09/2017  . Iron deficiency anemia due to chronic blood loss 08/14/2017  . Iron malabsorption 08/14/2017  . Hyperlipidemia LDL goal <100 07/08/2017  . Elevated ALT measurement   . Hypotension 06/20/2017  . HLD (hyperlipidemia) 06/20/2017  . CAD (coronary artery disease) 06/20/2017  . Protein-calorie malnutrition, severe ([place]) 06/20/2017  . Generalized weakness   . Weakness 05/30/2017  . Focal neurological deficit 05/30/2017  . Chronic diastolic CHF (congestive heart failure) ([place]) 05/30/2017  . TIA (transient ischemic attack) 05/30/2017  . AKI (acute kidney injury) ([place]) 05/30/2017  . Abnormal LFTs 05/30/2017  . Bronchiectasis ([place]) 04/22/2017  . Right otitis media 07/17/2016  . Cerumen impaction 07/17/2016  . Osteoporosis 03/16/2016  . Headache 11/20/2014  . Skin infection 05/27/2014  . Insomnia 05/27/2014  . Abdominal wall pain 07/09/2013  . Other dysphagia 02/02/2013  . Platelets decreased ([place]) 01/29/2012  . Vitamin D deficiency 12/13/2011  . Hypokalemia 12/13/2011  . Hypocalcemia 12/12/2011  . Adhesions of intestine, very dense, with frozen abdomen 12/11/2011  . SBO (small bowel obstruction), recurrent 12/11/2011  . Protein-calorie malnutrition, moderate ([place]) 12/11/2011  . Oxygen dependent   . Hx of gastric ulcer   . UTI (urinary tract infection) 10/15/2011  . Allergic conjunctivitis 10/15/2011  . Degenerative  arthritis of hip 08/03/2011  . B12 deficiency 07/02/2011  . Bilateral leg weakness 01/16/2011  . Anemia, chronic disease 09/14/2010  . Recurrent pneumonia 08/11/2010  . CRT- [employer]tr[employer] 06/26/2010  . DYSURIA 05/19/2010  . Chronic hypoxemic respiratory failure ([place]) 02/21/2010  . DEHYDRATION 01/06/2010  . GASTROENTERITIS 01/06/2010  . Congestive dilated cardiomyopathy ([place]) 11/17/2009  . LBBB 11/17/2009  . SYSTOLIC HEART FAILURE, CHRONIC 11/17/2009  . GERD (gastroesophageal reflux disease) 02/17/2009  . NONSPECIFIC ABN FINDNG RAD&OTH EXAM BILARY TRCT 09/03/2008  . COPD with emphysema ([place]) 06/28/2008  . DIVERTICULOSIS, COLON 06/03/2008  . GASTRITIS 06/02/2008  . Atrial fibrillation/flutter 12/08/2007  . GASTROPARESIS 08/07/2007  . NAUSEA, CHRONIC 08/07/2007  . Abdominal pain 08/07/2007  . SYNCOPE 02/17/2007  . RESTLESS LEG SYNDROME 08/01/2006  . Chronic pain syndrome 08/01/2006  . OSTEOARTHRITIS 07/31/2006  . Degenerative disc disease, lumbar 07/31/2006  . Fibromyalgia 07/31/2006  . CHRONIC FATIGUE SYNDROME 07/31/2006  . URINARY INCONTINENCE 07/31/2006  . ANEMIA-IRON DEFICIENCY 05/01/2006  . Essential hypertension 05/01/2006     Health Maintenance Due  Topic Date Due  . COVID-19 Vaccine (1) Never done  . MAMMOGRAM  09/14/2015  Review of Systems  Physical Exam   Pulse 77   Temp 97.9 F (36.6 C)   Wt 90 lb 3.2 oz (40.9 kg)   SpO2 98%   BMI 17.62 kg/m   Physical Exam  Constitutional:  oriented to person, place, and time. appears well-developed and well-nourished. No distress.  HENT: [place]/AT, PERRLA, no scleral icterus Mouth/Throat: Oropharynx is clear and moist. No oropharyngeal exudate.  Cardiovascular: Normal rate, regular rhythm and normal heart sounds. Exam reveals no gallop and no friction rub.  No murmur heard.  Pulmonary/Chest: Effort normal and breath sounds normal. No respiratory distress.  has no wheezes.  Chest wall = pacemaker pocket no fluid  collection nor erythema Abdominal: Soft. Bowel sounds are normal.  exhibits no distension. There is no tenderness.  Lymphadenopathy: no cervical adenopathy. No axillary adenopathy Neurological: alert and oriented to person, place, and time.  Skin: Skin is warm and dry. No rash noted. No erythema.  Psychiatric: a normal mood and affect.  behavior is normal.   CBC Lab Results  Component Value Date   WBC 6.5 09/08/2019   RBC 3.39 (L) 09/08/2019   HGB 9.6 (L) 09/08/2019   HCT 31.2 (L) 09/08/2019   PLT 236 09/08/2019   MCV 92.0 09/08/2019   MCH 28.3 09/08/2019   MCHC 30.8 09/08/2019   RDW 15.6 (H) 09/08/2019   LYMPHSABS 0.6 (L) 09/08/2019   MONOABS 0.7 09/08/2019   EOSABS 0.3 09/08/2019    BMET Lab Results  Component Value Date   NA 138 09/08/2019   K 4.8 09/08/2019   CL 103 09/08/2019   CO2 30 09/08/2019   GLUCOSE 110 (H) 09/08/2019   BUN 17 09/08/2019   CREATININE 0.92 09/08/2019   CALCIUM 8.7 (L) 09/08/2019   GFRNONAA >60 09/08/2019   GFRAA >60 09/08/2019      Assessment and Plan  Plan to restart regimen and follow up with combination therapy with rifabutin 300 mg PO thrice weekly, azithromycin 500 mg PO thrice weekly, and ethambutol 1000 mg PO thrice weekly. This regimen consists of: - rifabutin 2 x 150 mg capsules on Tuesdays, Thursdays, and Saturdays - ethambutol 2 x 400 mg tablets + 2 x 100 mg tablets on Mondays, Wednesdays, Fridays - azithromycin 1 x 500 mg tablet on Mondays, Wednesdays, Fridays  Follow up with Dr Halford Chessman in July

## 2019-09-08 NOTE — Patient Instructions (Signed)

## 2019-09-08 NOTE — Patient Instructions (Signed)
Please start back on: This regimen consists of: - rifabutin 2 x 150 mg capsules on Tuesdays, Thursdays, and Saturdays - ethambutol 2 x 400 mg tablets + 2 x 100 mg tablets on Mondays, Wednesdays, Fridays - azithromycin 1 x 500 mg tablet on Mondays, Wednesdays, Fridays  Also can use immodium if you have loose stools  If you have nausea from medication, can take at night, and or using phenergan

## 2019-09-08 NOTE — Patient Instructions (Signed)

## 2019-09-09 ENCOUNTER — Encounter: Payer: Self-pay | Admitting: Family Medicine

## 2019-09-11 DIAGNOSIS — J449 Chronic obstructive pulmonary disease, unspecified: Secondary | ICD-10-CM | POA: Diagnosis not present

## 2019-09-11 DIAGNOSIS — I4891 Unspecified atrial fibrillation: Secondary | ICD-10-CM | POA: Diagnosis not present

## 2019-09-14 ENCOUNTER — Other Ambulatory Visit: Payer: Self-pay | Admitting: Family Medicine

## 2019-09-14 DIAGNOSIS — R52 Pain, unspecified: Secondary | ICD-10-CM

## 2019-09-16 DIAGNOSIS — I4891 Unspecified atrial fibrillation: Secondary | ICD-10-CM | POA: Diagnosis not present

## 2019-09-16 DIAGNOSIS — J449 Chronic obstructive pulmonary disease, unspecified: Secondary | ICD-10-CM | POA: Diagnosis not present

## 2019-09-18 ENCOUNTER — Ambulatory Visit (INDEPENDENT_AMBULATORY_CARE_PROVIDER_SITE_OTHER): Payer: HMO | Admitting: *Deleted

## 2019-09-18 DIAGNOSIS — I428 Other cardiomyopathies: Secondary | ICD-10-CM | POA: Diagnosis not present

## 2019-09-22 ENCOUNTER — Encounter: Payer: Self-pay | Admitting: Internal Medicine

## 2019-09-22 ENCOUNTER — Other Ambulatory Visit: Payer: Self-pay

## 2019-09-22 ENCOUNTER — Ambulatory Visit: Payer: HMO | Admitting: Internal Medicine

## 2019-09-22 ENCOUNTER — Other Ambulatory Visit: Payer: Self-pay | Admitting: Family Medicine

## 2019-09-22 VITALS — BP 122/64 | HR 70 | Ht 60.0 in | Wt 88.0 lb

## 2019-09-22 DIAGNOSIS — I5032 Chronic diastolic (congestive) heart failure: Secondary | ICD-10-CM

## 2019-09-22 DIAGNOSIS — I480 Paroxysmal atrial fibrillation: Secondary | ICD-10-CM

## 2019-09-22 DIAGNOSIS — Z950 Presence of cardiac pacemaker: Secondary | ICD-10-CM | POA: Diagnosis not present

## 2019-09-22 DIAGNOSIS — G4700 Insomnia, unspecified: Secondary | ICD-10-CM

## 2019-09-22 DIAGNOSIS — I428 Other cardiomyopathies: Secondary | ICD-10-CM | POA: Diagnosis not present

## 2019-09-22 NOTE — Patient Instructions (Signed)
Medication Instructions:  Your physician has recommended you make the following change in your medication:   ** Stop Carvedilol  Labwork: None ordered.  Testing/Procedures: None ordered.  Follow-Up: Your physician wants you to follow-up in: 9 months with Dr Caryl Comes. You will receive a reminder letter in the mail two months in advance. If you don't receive a letter, please call our office to schedule the follow-up appointment.  Remote monitoring is used to monitor your Pacemaker of ICD from home. This monitoring reduces the number of office visits required to check your device to one time per year. It allows [place] to keep an eye on the functioning of your device to ensure it is working properly.  Any Other Special Instructions Will Be Listed Below (If Applicable).  If you need a refill on your cardiac medications before your next appointment, please call your pharmacy.

## 2019-09-22 NOTE — Progress Notes (Signed)
Patient Care Team: Carollee Herter, Alferd Apa, DO as PCP - General Minus Breeding, MD as PCP - Cardiology (Cardiology) Deboraha Sprang, MD as PCP - Electrophysiology (Cardiology) Rolm Bookbinder, MD as Consulting Physician (General Surgery) Lafayette Dragon, MD (Inactive) as Consulting Physician (Gastroenterology) Gatha Mayer, MD as Consulting Physician (Gastroenterology) Rutherford Guys, MD as Consulting Physician (Ophthalmology) Crista Luria, MD as Consulting Physician (Dermatology) Deboraha Sprang, MD as Consulting Physician (Cardiology) Minus Breeding, MD as Consulting Physician (Cardiology) Dimitri Ped, RN as [facility], Melvenia Beam, [facility] as Pharmacist (Pharmacist)   HPI  [name] is a 74 y.o. female seen in followup for congestive heart failure in the setting of nonischemic cardiomyopathy. She underwent CRT-P implantation October 2011. Generator replacement 2021   CRT assoc w  significant improvement although complicated by left upper extremity deep venous thrombosis.   She also has a history of atrial arrhythmias for which she was previously  treated with amiodarone now discontinued   DATE TEST EF   12/09 Myoview   34 %   3/16 Echo   60-65 %   4/21 TEE  60-65%    She has oxygen-dependent COPD    5/21 admitted with high fevers with some pocket fluid; blood cultures and TEE were negative.     Recent initiation of therapy for pulmonary MAI as an explanation for recurrent pneumonia  Overall doing pretty well.  Breathing is at baseline.  No edema.  No chest pain.  Complains of hands and feet being cold.  Some tingling.     Date Cr K Hgb  1/19/  1.01 4.5 10.6   6/21 0.92 4.8 9.6     Past Medical History:  Diagnosis Date  . Anemia   . Arthritis   . Atrial fibrillation ([place])   . CAD (coronary artery disease)    Mild disease per cath in 2011  . COPD (chronic obstructive pulmonary disease) ([place])    Home O2  . Diverticulitis    . Elbow fracture, left aug 2012  . Erythropoietin deficiency anemia 06/19/2018  . Essential hypertension   . Fibromyalgia   . Gastroparesis   . GERD (gastroesophageal reflux disease)   . H/O: GI bleed    from Pradaxa  . Hepatitis    Hx of in high school   . History of pneumonia    Recurrent  . Hx MRSA infection   . Hx of gastric ulcer   . Hx of stress fx 10/2009   Right hip   . Iron malabsorption 08/14/2017  . LBBB (left bundle branch block)   . Oxygen dependent    2 liters via nasal cannula at all times  . Pacemaker    CRT therapy; followed by Dr. Caryl Comes  . Pleural effusion     s/p right thoracentesis 03/09  . Primary dilated cardiomyopathy (HCC)    EF 45 to 50% per echo in Jan 2012  . RLS (restless legs syndrome)   . Silent aspiration   . Small bowel obstruction ([place])   . Urinary incontinence   . Venous embolism and thrombosis of subclavian vein [facility])    After pacemaker insertion Oct 2011  . Vitamin B12 deficiency   . Wears glasses    Reading    Past Surgical History:  Procedure Laterality Date  . APPENDECTOMY    . BIV PACEMAKER GENERATOR CHANGEOUT N/A 06/19/2019   Procedure: BIV PACEMAKER GENERATOR CHANGEOUT;  Surgeon: Deboraha Sprang, MD;  Location: [facility]  CV LAB;  Service: Cardiovascular;  Laterality: N/A;  . BRONCHIAL WASHINGS  05/25/2019   Procedure: BRONCHIAL WASHINGS;  Surgeon: Chesley Mires, MD;  Location: [facility] ENDOS[facility];  Service: Endoscopy;;  . CARDIAC CATHETERIZATION  04/08/2006, 11/16/2009  . CHOLECYSTECTOMY N/A 06/27/2017   Procedure: OPEN CHOLECYSTECTOMY;  Surgeon: Ralene Ok, MD;  Location: [facility];  Service: General;  Laterality: N/A;  . COLECTOMY     Intestinal resection (5times)  . COLECTOMY  02/04/2000   ex. lap., intra-abd. subtotal colectomy with ileosigmoid colon anastomosies and lysis of adhesions  . CYSTOSCOPY  11/11/2008  . CYSTOSCOPY W/ RETROGRADES  11/11/2008   right  . CYSTOSCOPY WITH INJECTION  04/30/2006   transurethral collagen  injection; incision vaginal stenosis  . ERCP N/A 06/23/2017   Procedure: ENDOSCOPIC RETROGRADE CHOLANGIOPANCREATOGRAPHY (ERCP);  Surgeon: Carol Ada, MD;  Location: [facility];  Service: Endoscopy;  Laterality: N/A;  . ERCP N/A 06/24/2017   Procedure: ENDOSCOPIC RETROGRADE CHOLANGIOPANCREATOGRAPHY (ERCP);  Surgeon: [name], MD;  Location: [facility] ENDOSCOPY;  Service: Endoscopy;  Laterality: N/A;  . ESOPHAGOGASTRODUODENOSCOPY N/A 02/02/2013   Procedure: ESOPHAGOGASTRODUODENOSCOPY (EGD);  Surgeon: Lafayette Dragon, MD;  Location: [facility];  Service: Endoscopy;  Laterality: N/A;  . EXPLORATORY LAPAROTOMY  04/27/2009   lysis of adhesions, gastrostomy tube  . GASTROCUTANEOUS FISTULA CLOSURE    . INTERSTIM IMPLANT PLACEMENT  05/28/2006 - stage I   06/05/2006 - stage II  . INTERSTIM IMPLANT PLACEMENT  02/05/2012   Procedure: [name] IMPLANT FIRST STAGE;  Surgeon: Reece Packer, MD;  [facility];  Service: Urology;  Laterality: N/A;  Replacement of Interstim Lead     . INTERSTIM IMPLANT REVISION  03/06/2011   Procedure: REVISION OF [name];  Surgeon: Reece Packer, MD;  [facility];  Service: Urology;  Laterality: N/A;  Replacement of Neurostimulator  . INTERSTIM IMPLANT REVISION  10/23/2007  . IR IMAGING GUIDED PORT INSERTION  10/14/2017  . PACEMAKER PLACEMENT  12/21/2009  . Peg removed     with complications  . PORT-A-CATH REMOVAL  07/27/2011   Procedure: REMOVAL PORT-A-CATH;  Surgeon: Rolm Bookbinder, MD;  Location: [facility];  Service: General;  Laterality: N/A;  . PORTACATH PLACEMENT  12/16/2009  . SAVORY DILATION N/A 02/02/2013   Procedure: SAVORY DILATION;  Surgeon: Lafayette Dragon, MD;  [facility];  Service: Endoscopy;  Laterality: N/A;  . SMALL INTESTINE SURGERY  05/20/2001   ex. lap., resection of small bowel stricture; gastrostomy; insertion central line  . TEE WITHOUT CARDIOVERSION N/A 07/17/2019   Procedure: TRANSESOPHAGEAL ECHOCARDIOGRAM  (TEE);  Surgeon: Donato Heinz, MD;  [facility] ENDOSCOPY;  Service: Cardiovascular;  Laterality: N/A;  . TONSILLECTOMY  age 35  . TOTAL ABDOMINAL HYSTERECTOMY     complete  . TOTAL HIP ARTHROPLASTY  08/03/2011   Procedure: TOTAL HIP ARTHROPLASTY ANTERIOR APPROACH;  Surgeon: Mcarthur Rossetti, MD;  [facility];  Service: Orthopedics;  Laterality: Right;  [name] VIDEO BRONCHOSCOPY N/A 05/25/2019   Procedure: VIDEO BRONCHOSCOPY WITHOUT FLUORO;  Surgeon: Chesley Mires, MD;  [facility];  Service: Endoscopy;  Laterality: N/A;    Current Outpatient Medications  Medication Sig Dispense Refill  . acetaminophen (TYLENOL) 650 MG CR tablet Take 650 mg by mouth every 8 (eight) hours as needed for pain.    [name] albuterol (PROVENTIL HFA) 108 (90 Base) MCG/ACT inhaler Inhale 2 puffs into the lungs every 6 (six) hours as needed for wheezing or shortness of breath.    [name] amLODipine (NORVASC) 2.5 MG tablet  Take 1 tablet (2.5 mg total) by mouth daily. (Patient taking differently: Take 2.5 mg by mouth at bedtime. ) 90 tablet 3  . atorvastatin (LIPITOR) 20 MG tablet TAKE 1 TABLET BY MOUTH ONCE DAILY AT  6PM (Patient taking differently: Take 20 mg by mouth every evening. ) 90 tablet 1  . carvedilol (COREG) 25 MG tablet Take 1 tablet (25 mg total) by mouth at bedtime. 90 tablet 0  . denosumab (PROLIA) 60 MG/ML SOSY injection Inject 60 mg into the skin every 6 (six) months.     [name] ELIQUIS 5 MG TABS tablet Take 1 tablet by mouth twice daily 180 tablet 1  . furosemide (LASIX) 40 MG tablet TAKE 1 TABLET BY MOUTH IN THE MORNING (Patient taking differently: Take 40 mg by mouth in the morning. ) 90 tablet 2  . gabapentin (NEURONTIN) 600 MG tablet Take 1 tablet by mouth 4 times daily 120 tablet 5  . lidocaine-prilocaine (EMLA) cream Apply 1 application topically as needed. (Patient taking differently: Apply 1 application topically as needed (for port access- to numb). ) 30 g 6  . nitrofurantoin  (MACRODANTIN) 100 MG capsule Take 100 mg by mouth daily.    . nitroGLYCERIN (NITROSTAT) 0.4 MG SL tablet Place 1 tablet (0.4 mg total) under the tongue every 5 (five) minutes as needed. CHEST PAIN (Patient taking differently: Place 0.4 mg under the tongue every 5 (five) minutes as needed for chest pain. ) 25 tablet 6  . oxyCODONE-acetaminophen (PERCOCET) 10-325 MG tablet Take 1 tablet by mouth every 4 (four) hours as needed. PAIN 120 tablet 0  . OXYGEN Inhale 3 L/min into the lungs continuous.     . pantoprazole (PROTONIX) 40 MG tablet Take 1 tablet by mouth twice daily (Patient taking differently: Take 40 mg by mouth 2 (two) times daily. ) 180 tablet 0  . tiZANidine (ZANAFLEX) 4 MG tablet Take 1 tablet (4 mg total) by mouth 3 (three) times daily as needed. for muscle spams 90 tablet 1  . triamcinolone ointment (KENALOG) 0.1 % Apply 1 application topically daily as needed (to affected areas- for psoriasis).     [name] umeclidinium bromide (INCRUSE ELLIPTA) 62.5 MCG/INH AEPB Inhale 1 puff into the lungs daily. 1 each 12  . zolpidem (AMBIEN) 5 MG tablet TAKE 1 TABLET BY MOUTH EVERY DAY AT BEDTIME AS NEEDED 30 tablet 0   No current facility-administered medications for this visit.    Allergies  Allergen Reactions  . Dabigatran Etexilate Mesylate Other (See Comments)    INTERNAL BLEEDING- Pradaxa  . Augmentin [Amoxicillin-Pot Clavulanate] Diarrhea and Nausea Only    Did it involve swelling of the face/tongue/throat, SOB, or low BP? No Did it involve sudden or severe rash/hives, skin peeling, or any reaction on the inside of your mouth or nose? No Did you need to seek medical attention at a hospital or doctor's office? No When did it last happen?02/2019 If all above answers are "NO", may proceed with cephalosporin use.   [name] [Pentazocine] Other (See Comments)    Hallucinations     Review of Systems negative except from HPI and PMH  Physical Exam BP 122/64   Pulse 70   Ht 5' (1.524 m)    Wt 88 lb (39.9 kg)   SpO2 97%   BMI 17.19 kg/m  Tiny cachectic in no acute distress HENT normal Neck supple with JVP-flat Clear Device pocket well healed; without hematoma or erythema.  There is no tethering  Regular rate and rhythm,  no murmur Abd-soft with active BS No Clubbing cyanosis   edema Skin-warm and dry A & Oriented  Grossly normal sensory and motor function  ECG sinus w P-synchronous/ AV  pacing  Qr lead I and QS lead V1  QRS d 96 msec   Assessment and  Plan  Nonischemic cardiomyopathy interval resolution  Congestive heart failure diastolic     Atrial arrhythmia    Pacemaker-CRT-Medtronic     Anemia chronic    Euvolemic continue current meds OptiVol flat   Blood pressure at home has been low.  This prompted decrease of her carvedilol from 25 at bedtime--12.5 at bedtime.  I wonder whether it is contributing to her cold fingers and toes via relative beta-blockade.  At this point with interval normalization of LV function we will discontinue the carvedilol.  She will let us know in a few weeks how she is doing.  \No significant arrhythmia by device interrogation.

## 2019-09-22 NOTE — Progress Notes (Signed)
Remote pacemaker transmission.   

## 2019-09-23 NOTE — Telephone Encounter (Signed)
Ambien refill. [place] Pt.   Last OV: 08/07/2019 Last Fill: 08/19/2019 #30 and 0RF Pt sig: 1 tab qhs prn UDS: 05/30/2017

## 2019-09-25 ENCOUNTER — Other Ambulatory Visit: Payer: Self-pay | Admitting: Family Medicine

## 2019-09-25 DIAGNOSIS — R52 Pain, unspecified: Secondary | ICD-10-CM

## 2019-09-25 MED ORDER — OXYCODONE-ACETAMINOPHEN 10-325 MG PO TABS: 1.0000 | ORAL_TABLET | ORAL | 0 refills | Status: DC | PRN

## 2019-09-25 NOTE — Telephone Encounter (Signed)
Medication: oxyCODONE-acetaminophen (PERCOCET) 10-325 MG tablet [[id number]]  Has the patient contacted their pharmacy? No. (If no, request that the patient contact the pharmacy for the refill.) (If yes, when and what did the pharmacy advise?)  Preferred Pharmacy (with phone number or street name):  [facility] [address], [place] [address]  Phone:  [phone] Fax:  [phone]  DEA #:  --        Agent: Please be advised that RX refills may take up to 3 business days. We ask that you follow-up with your pharmacy.

## 2019-09-25 NOTE — Telephone Encounter (Signed)
Requesting: Percocet Contract: 11/06/2018 UDS: 11/06/2018 Last OV: 08/07/2019 Next OV: 11/02/2019 w/ Melvenia Beam Last Refill: 08/31/2019, #120--0 RF Database:   Please advise

## 2019-09-26 LAB — CUP PACEART REMOTE DEVICE CHECK
Battery Remaining Longevity: 119 mo
Battery Voltage: 3.17 V
Brady Statistic AP VP Percent: 0.82 %
Brady Statistic AP VS Percent: 0.01 %
Brady Statistic AS VP Percent: 99.06 %
Brady Statistic AS VS Percent: 0.11 %
Brady Statistic RA Percent Paced: 0.86 %
Brady Statistic RV Percent Paced: 99.88 %
Date Time Interrogation Session: 20210701231537
Implantable Lead Implant Date: 20111005
Implantable Lead Implant Date: 20111005
Implantable Lead Implant Date: 20111005
Implantable Lead Location: 753858
Implantable Lead Location: 753859
Implantable Lead Location: 753860
Implantable Lead Model: 4196
Implantable Lead Model: 5076
Implantable Lead Model: 5076
Implantable Pulse Generator Implant Date: 20210402
Lead Channel Impedance Value: 285 Ohm
Lead Channel Impedance Value: 285 Ohm
Lead Channel Impedance Value: 361 Ohm
Lead Channel Impedance Value: 361 Ohm
Lead Channel Impedance Value: 380 Ohm
Lead Channel Impedance Value: 399 Ohm
Lead Channel Impedance Value: 437 Ohm
Lead Channel Impedance Value: 456 Ohm
Lead Channel Impedance Value: 589 Ohm
Lead Channel Pacing Threshold Amplitude: 0.625 V
Lead Channel Pacing Threshold Amplitude: 0.625 V
Lead Channel Pacing Threshold Pulse Width: 0.4 ms
Lead Channel Pacing Threshold Pulse Width: 0.4 ms
Lead Channel Sensing Intrinsic Amplitude: 0.75 mV
Lead Channel Sensing Intrinsic Amplitude: 0.75 mV
Lead Channel Sensing Intrinsic Amplitude: 7.5 mV
Lead Channel Setting Pacing Amplitude: 1.25 V
Lead Channel Setting Pacing Amplitude: 2 V
Lead Channel Setting Pacing Amplitude: 2.5 V
Lead Channel Setting Pacing Pulse Width: 0.4 ms
Lead Channel Setting Pacing Pulse Width: 0.8 ms
Lead Channel Setting Sensing Sensitivity: 0.9 mV

## 2019-09-29 ENCOUNTER — Inpatient Hospital Stay: Payer: HMO

## 2019-09-29 ENCOUNTER — Other Ambulatory Visit: Payer: Self-pay

## 2019-09-29 ENCOUNTER — Telehealth: Payer: Self-pay | Admitting: Family

## 2019-09-29 ENCOUNTER — Inpatient Hospital Stay (HOSPITAL_BASED_OUTPATIENT_CLINIC_OR_DEPARTMENT_OTHER): Payer: HMO | Admitting: Family

## 2019-09-29 ENCOUNTER — Inpatient Hospital Stay: Payer: HMO | Attending: Family

## 2019-09-29 VITALS — BP 117/68 | HR 71 | Temp 97.8°F | Resp 18 | Wt 86.0 lb

## 2019-09-29 DIAGNOSIS — N189 Chronic kidney disease, unspecified: Secondary | ICD-10-CM | POA: Diagnosis not present

## 2019-09-29 DIAGNOSIS — K909 Intestinal malabsorption, unspecified: Secondary | ICD-10-CM | POA: Diagnosis not present

## 2019-09-29 DIAGNOSIS — Z79899 Other long term (current) drug therapy: Secondary | ICD-10-CM | POA: Diagnosis not present

## 2019-09-29 DIAGNOSIS — D508 Other iron deficiency anemias: Secondary | ICD-10-CM | POA: Diagnosis not present

## 2019-09-29 DIAGNOSIS — D631 Anemia in chronic kidney disease: Secondary | ICD-10-CM | POA: Diagnosis not present

## 2019-09-29 DIAGNOSIS — D500 Iron deficiency anemia secondary to blood loss (chronic): Secondary | ICD-10-CM | POA: Diagnosis not present

## 2019-09-29 DIAGNOSIS — Z7901 Long term (current) use of anticoagulants: Secondary | ICD-10-CM | POA: Insufficient documentation

## 2019-09-29 LAB — CBC WITH DIFFERENTIAL (CANCER CENTER ONLY)
Abs Immature Granulocytes: 0.03 10*3/uL (ref 0.00–0.07)
Basophils Absolute: 0 10*3/uL (ref 0.0–0.1)
Basophils Relative: 1 %
Eosinophils Absolute: 0.1 10*3/uL (ref 0.0–0.5)
Eosinophils Relative: 3 %
HCT: 39.1 % (ref 36.0–46.0)
Hemoglobin: 11.9 g/dL — ABNORMAL LOW (ref 12.0–15.0)
Immature Granulocytes: 1 %
Lymphocytes Relative: 15 %
Lymphs Abs: 0.5 10*3/uL — ABNORMAL LOW (ref 0.7–4.0)
MCH: 28.3 pg (ref 26.0–34.0)
MCHC: 30.4 g/dL (ref 30.0–36.0)
MCV: 93.1 fL (ref 80.0–100.0)
Monocytes Absolute: 0.4 10*3/uL (ref 0.1–1.0)
Monocytes Relative: 12 %
Neutro Abs: 2.1 10*3/uL (ref 1.7–7.7)
Neutrophils Relative %: 68 %
Platelet Count: 218 10*3/uL (ref 150–400)
RBC: 4.2 MIL/uL (ref 3.87–5.11)
RDW: 15.1 % (ref 11.5–15.5)
WBC Count: 3.1 10*3/uL — ABNORMAL LOW (ref 4.0–10.5)
nRBC: 0 % (ref 0.0–0.2)

## 2019-09-29 LAB — CMP (CANCER CENTER ONLY)
ALT: 46 U/L — ABNORMAL HIGH (ref 0–44)
AST: 53 U/L — ABNORMAL HIGH (ref 15–41)
Albumin: 3.9 g/dL (ref 3.5–5.0)
Alkaline Phosphatase: 215 U/L — ABNORMAL HIGH (ref 38–126)
Anion gap: 7 (ref 5–15)
BUN: 27 mg/dL — ABNORMAL HIGH (ref 8–23)
CO2: 30 mmol/L (ref 22–32)
Calcium: 8.5 mg/dL — ABNORMAL LOW (ref 8.9–10.3)
Chloride: 103 mmol/L (ref 98–111)
Creatinine: 0.96 mg/dL (ref 0.44–1.00)
GFR, Est AFR Am: >60 mL/min (ref >60)
GFR, Estimated: 58 mL/min — ABNORMAL LOW (ref >60)
Glucose, Bld: 105 mg/dL — ABNORMAL HIGH (ref 70–99)
Potassium: 4.6 mmol/L (ref 3.5–5.1)
Sodium: 140 mmol/L (ref 135–145)
Total Bilirubin: 0.4 mg/dL (ref 0.3–1.2)
Total Protein: 7.1 g/dL (ref 6.5–8.1)

## 2019-09-29 LAB — RETICULOCYTES
Immature Retic Fract: 3.6 % (ref 2.3–15.9)
RBC.: 4.16 MIL/uL (ref 3.87–5.11)
Retic Count, Absolute: 20.8 10*3/uL (ref 19.0–186.0)
Retic Ct Pct: 0.5 % (ref 0.4–3.1)

## 2019-09-29 MED ORDER — HEPARIN SOD (PORK) LOCK FLUSH 100 UNIT/ML IV SOLN
500.0000 [IU] | Freq: Once | INTRAVENOUS | Status: AC | PRN
  Administered 2019-09-29: 500 [IU]
  Filled 2019-09-29: qty 5

## 2019-09-29 MED ORDER — SODIUM CHLORIDE 0.9% FLUSH
10.0000 mL | Freq: Once | INTRAVENOUS | Status: AC | PRN
  Administered 2019-09-29: 10 mL
  Filled 2019-09-29: qty 10

## 2019-09-29 NOTE — Telephone Encounter (Signed)
Appointments scheduled calendar printed & mailed per 7/13 los

## 2019-09-29 NOTE — Patient Instructions (Signed)

## 2019-09-29 NOTE — Progress Notes (Signed)
Hematology and Oncology Follow Up Visit  [name] [id number] 1945-05-01 74 y.o. 09/29/2019   Principle Diagnosis:  Iron deficiency anemia Erythropoietin deficient anemia  Current Therapy: IV iron as indicated Retacrit 40,000 units SQfor hgb <11   Interim History:  [name] is here today for follow-up. She is feeling fatigued most days.  Hgb is improved at 11.9, MCV 93, platelets 218 and WBC count 3.1.  She has not noted any blood loss. No petechiae.  She states that her SOB seems stable.  No fever, chills, n/v, cough, rash, dizziness, chest pain, palpitations, abdominal pain or changes in bowel or bladder habits.  No swelling or tenderness in her extremities.  The numbness and tingling in her hands and feet remains stable.  No falls or syncopal episodes to report.  She states that she is eating well and staying properly hydrated. Her weight is stable.   ECOG Performance Status: 1 - Symptomatic but completely ambulatory  Medications:  Allergies as of 09/29/2019      Reactions   Dabigatran Etexilate Mesylate Other (See Comments)   INTERNAL BLEEDING- Pradaxa   Augmentin [amoxicillin-pot Clavulanate] Diarrhea, Nausea Only   Did it involve swelling of the face/tongue/throat, SOB, or low BP? No Did it involve sudden or severe rash/hives, skin peeling, or any reaction on the inside of your mouth or nose? No Did you need to seek medical attention at a hospital or doctor's office? No When did it last happen?02/2019 If all above answers are "NO", may proceed with cephalosporin use.   Talwin [pentazocine] Other (See Comments)   Hallucinations      Medication List       Accurate as of September 29, 2019 10:55 AM. If you have any questions, ask your nurse or doctor.        acetaminophen 650 MG CR tablet Commonly known as: TYLENOL Take 650 mg by mouth every 8 (eight) hours as needed for pain.   amLODipine 2.5 MG tablet Commonly known as: NORVASC Take 1 tablet  (2.5 mg total) by mouth daily. What changed: when to take this   atorvastatin 20 MG tablet Commonly known as: LIPITOR TAKE 1 TABLET BY MOUTH ONCE DAILY AT  6PM What changed:   how much to take  how to take this  when to take this  additional instructions   denosumab 60 MG/ML Sosy injection Commonly known as: PROLIA Inject 60 mg into the skin every 6 (six) months.   Eliquis 5 MG Tabs tablet Generic drug: apixaban Take 1 tablet by mouth twice daily   furosemide 40 MG tablet Commonly known as: LASIX TAKE 1 TABLET BY MOUTH IN THE MORNING What changed: when to take this   gabapentin 600 MG tablet Commonly known as: NEURONTIN Take 1 tablet by mouth 4 times daily   Incruse Ellipta 62.5 MCG/INH Aepb Generic drug: umeclidinium bromide Inhale 1 puff into the lungs daily.   lidocaine-prilocaine cream Commonly known as: EMLA Apply 1 application topically as needed. What changed: reasons to take this   nitrofurantoin 100 MG capsule Commonly known as: MACRODANTIN Take 100 mg by mouth daily.   nitroGLYCERIN 0.4 MG SL tablet Commonly known as: NITROSTAT Place 1 tablet (0.4 mg total) under the tongue every 5 (five) minutes as needed. CHEST PAIN What changed:   reasons to take this  additional instructions   oxyCODONE-acetaminophen 10-325 MG tablet Commonly known as: PERCOCET Take 1 tablet by mouth every 4 (four) hours as needed. PAIN   OXYGEN Inhale 3  L/min into the lungs continuous.   pantoprazole 40 MG tablet Commonly known as: PROTONIX Take 1 tablet by mouth twice daily   Proventil HFA 108 (90 Base) MCG/ACT inhaler Generic drug: albuterol Inhale 2 puffs into the lungs every 6 (six) hours as needed for wheezing or shortness of breath.   tiZANidine 4 MG tablet Commonly known as: ZANAFLEX Take 1 tablet (4 mg total) by mouth 3 (three) times daily as needed. for muscle spams   triamcinolone ointment 0.1 % Commonly known as: KENALOG Apply 1 application  topically daily as needed (to affected areas- for psoriasis).   zolpidem 5 MG tablet Commonly known as: AMBIEN TAKE 1 TABLET BY MOUTH EVERY DAY AT BEDTIME AS NEEDED       Allergies:  Allergies  Allergen Reactions  . Dabigatran Etexilate Mesylate Other (See Comments)    INTERNAL BLEEDING- Pradaxa  . Augmentin [Amoxicillin-Pot Clavulanate] Diarrhea and Nausea Only    Did it involve swelling of the face/tongue/throat, SOB, or low BP? No Did it involve sudden or severe rash/hives, skin peeling, or any reaction on the inside of your mouth or nose? No Did you need to seek medical attention at a hospital or doctor's office? No When did it last happen?02/2019 If all above answers are "NO", may proceed with cephalosporin use.   [name] [Pentazocine] Other (See Comments)    Hallucinations     Past Medical History, Surgical history, Social history, and Family History were reviewed and updated.  Review of Systems: All other 10 point review of systems is negative.   Physical Exam:  vitals were not taken for this visit.   Wt Readings from Last 3 Encounters:  09/22/19 88 lb (39.9 kg)  09/08/19 90 lb 3.2 oz (40.9 kg)  08/18/19 89 lb (40.4 kg)    Ocular: Sclerae unicteric, pupils equal, round and reactive to light Ear-nose-throat: Oropharynx clear, dentition fair Lymphatic: No cervical or supraclavicular adenopathy Lungs no rales or rhonchi, good excursion bilaterally Heart regular rate and rhythm, no murmur appreciated Abd soft, nontender, positive bowel sounds, no liver or spleen tip palpated on exam, no fluid wave  MSK no focal spinal tenderness, no joint edema Neuro: non-focal, well-oriented, appropriate affect Breasts: Deferred   Lab Results  Component Value Date   WBC 6.5 09/08/2019   HGB 9.6 (L) 09/08/2019   HCT 31.2 (L) 09/08/2019   MCV 92.0 09/08/2019   PLT 236 09/08/2019   Lab Results  Component Value Date   FERRITIN 645 (H) 08/18/2019   IRON 70 08/18/2019    TIBC 250 08/18/2019   UIBC 180 08/18/2019   IRONPCTSAT 28 08/18/2019   Lab Results  Component Value Date   RETICCTPCT 0.8 08/18/2019   RBC 3.39 (L) 09/08/2019   RETICCTABS 64.6 04/01/2009   No results found for: KPAFRELGTCHN, LAMBDASER, KAPLAMBRATIO No results found for: IGGSERUM, IGA, IGMSERUM No results found for: Odetta Pink, SPEI   Chemistry      Component Value Date/Time   NA 138 09/08/2019 1109   NA 135 06/16/2019 1333   K 4.8 09/08/2019 1109   CL 103 09/08/2019 1109   CO2 30 09/08/2019 1109   BUN 17 09/08/2019 1109   BUN 31 (H) 06/16/2019 1333   CREATININE 0.92 09/08/2019 1109   CREATININE 0.91 03/16/2016 1539      Component Value Date/Time   CALCIUM 8.7 (L) 09/08/2019 1109   ALKPHOS 123 09/08/2019 1109   AST 26 09/08/2019 1109   ALT 13  09/08/2019 1109   BILITOT 0.4 09/08/2019 1109       Impression and Plan: Ms. Granderson is a very pleasant 72yo caucasian female with iron deficiency anemia secondary to malabsorption and possible GI blood loss with Eliquis. No Retacrit needed for Hgb 11.9.  Iron studies are pending and we will replace next week if needed. We will plan to see her again in another 3 weeks for lab/injection and follow-up in 6 weeks.  She will contact our office with any questions or concerns. We can certainly see her sooner if needed.  Laverna Peace, NP 7/13/202110:55 AM

## 2019-09-30 LAB — IRON AND TIBC
Iron: 76 ug/dL (ref 41–142)
Saturation Ratios: 42 % (ref 21–57)
TIBC: 181 ug/dL — ABNORMAL LOW (ref 236–444)
UIBC: 105 ug/dL — ABNORMAL LOW (ref 120–384)

## 2019-09-30 LAB — FERRITIN: Ferritin: 1542 ng/mL — ABNORMAL HIGH (ref 11–307)

## 2019-10-01 ENCOUNTER — Other Ambulatory Visit: Payer: Self-pay | Admitting: Cardiology

## 2019-10-01 DIAGNOSIS — I10 Essential (primary) hypertension: Secondary | ICD-10-CM

## 2019-10-07 ENCOUNTER — Telehealth: Payer: HMO | Admitting: Internal Medicine

## 2019-10-07 IMAGING — CT CT HEAD W/O CM
3 series · 15 of 46 positions shown, 18 images · non-contrast
Comparison: 06/19/2017

CLINICAL DATA: Tripped over dog and fell striking her forehead on
the kitchen counter top, laceration to forehead, denies loss of
consciousness; history atrial fibrillation, coronary artery disease,
CHF, COPD, fibromyalgia, hypertension, primary dilated
cardiomyopathy

EXAM:
CT HEAD WITHOUT CONTRAST
TECHNIQUE: Contiguous axial images were obtained from the base of the skull
through the vertex without intravenous contrast. Sagittal and
coronal MPR images reconstructed from axial data set.

[Series 2: head wo · axial · 0.38mm/px · z∈[-179,-59]mm · 9 of 29 slices shown, 12 images]
[im 3/29  brain]
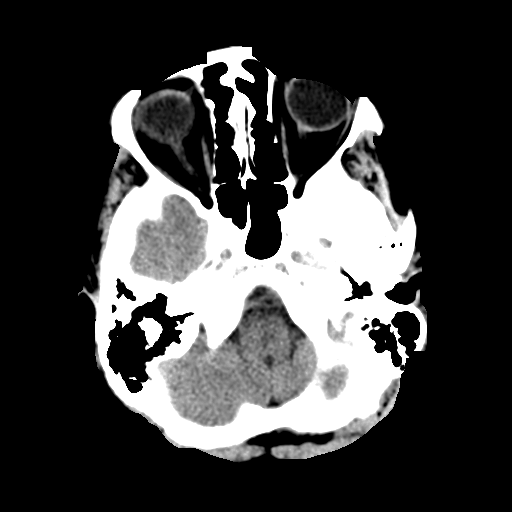
[im 3/29  bone]
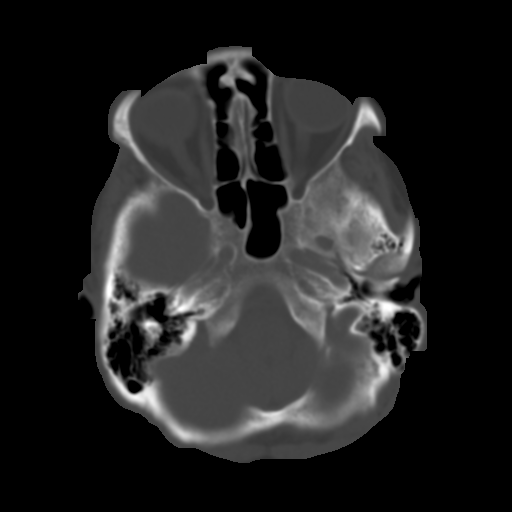
[im 6/29  brain]
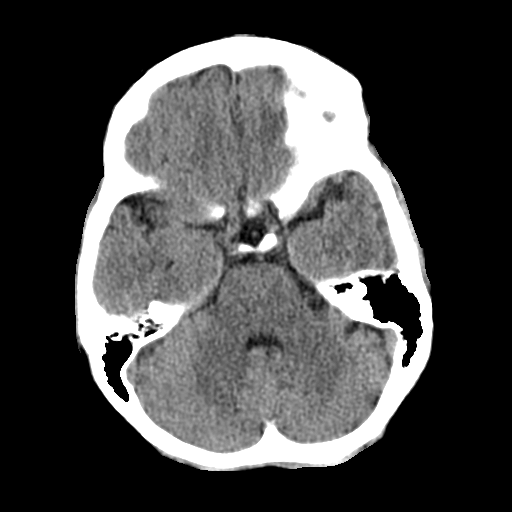
[im 9/29  brain]
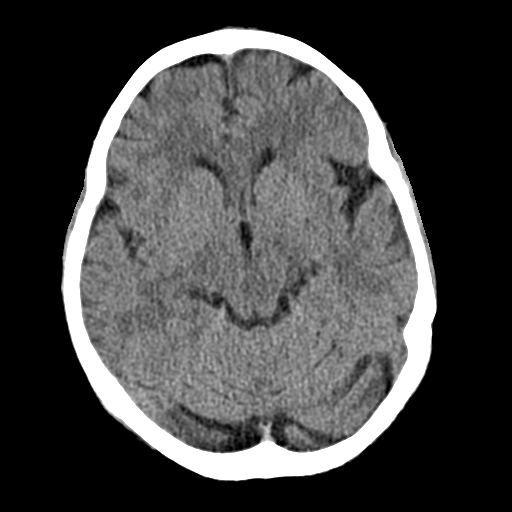
[im 12/29  brain]
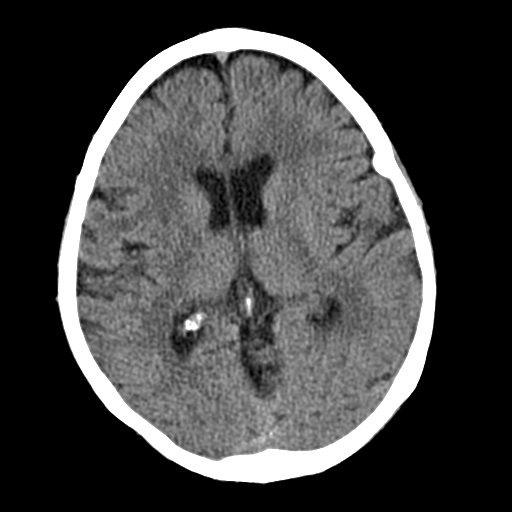
[im 15/29  brain]
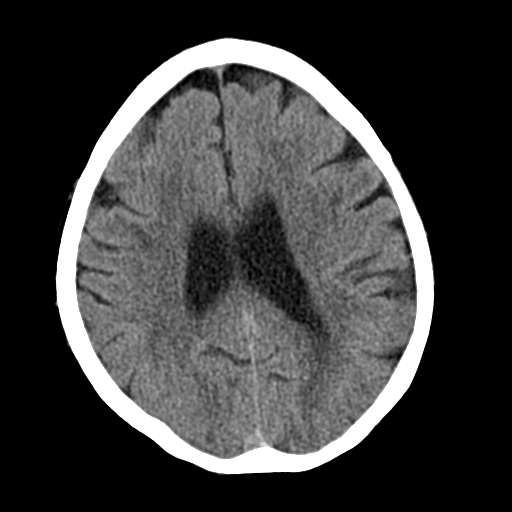
[im 15/29  bone]
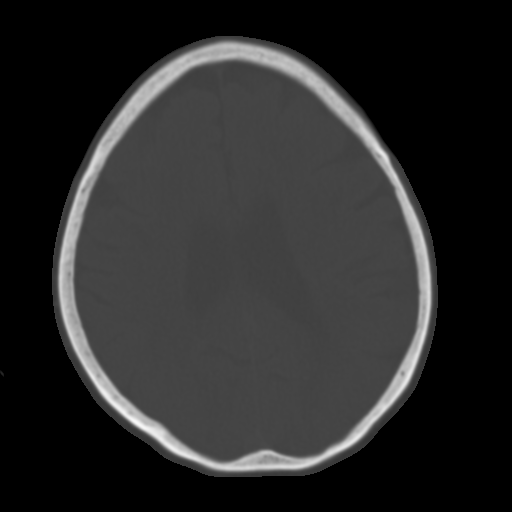
[im 18/29  brain]
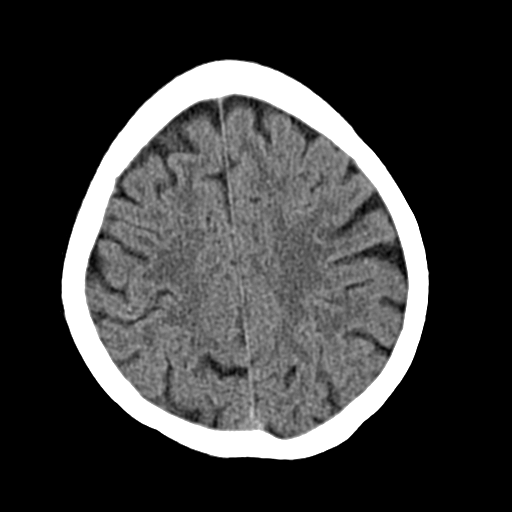
[im 21/29  brain]
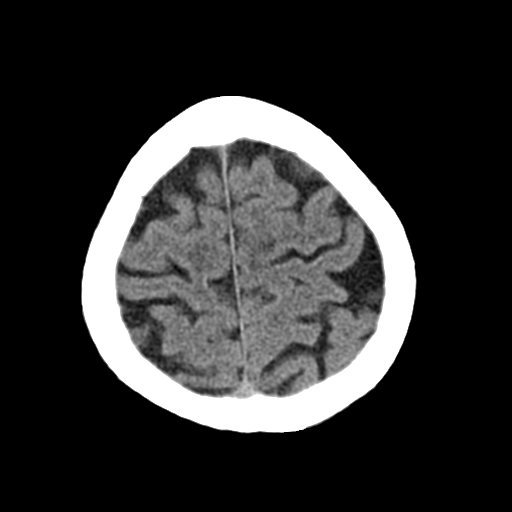
[im 24/29  brain]
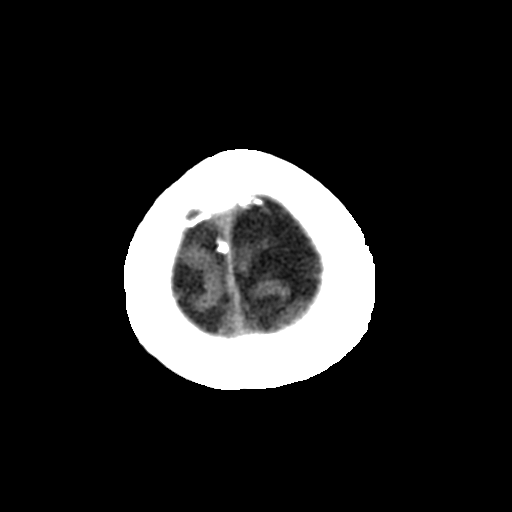
[im 27/29  brain]
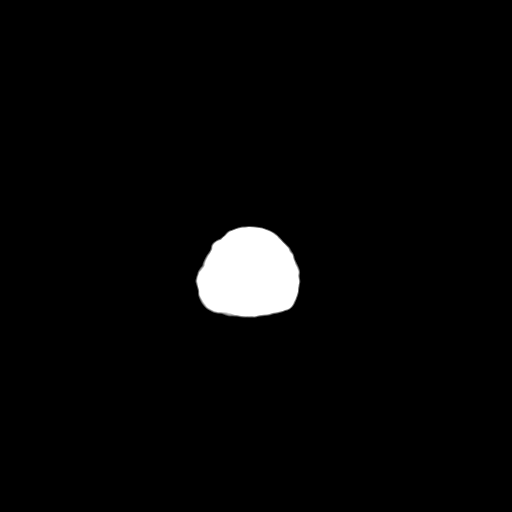
[im 27/29  bone]
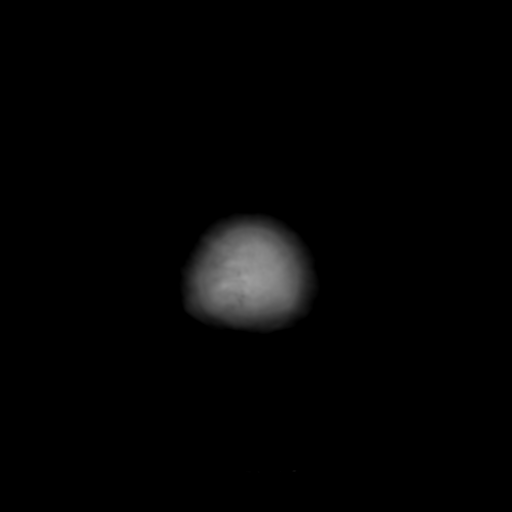

[Series 4: cor soft · coronal · 0.28mm/px · 3 of 64 slices shown]
[im 22/64  brain]
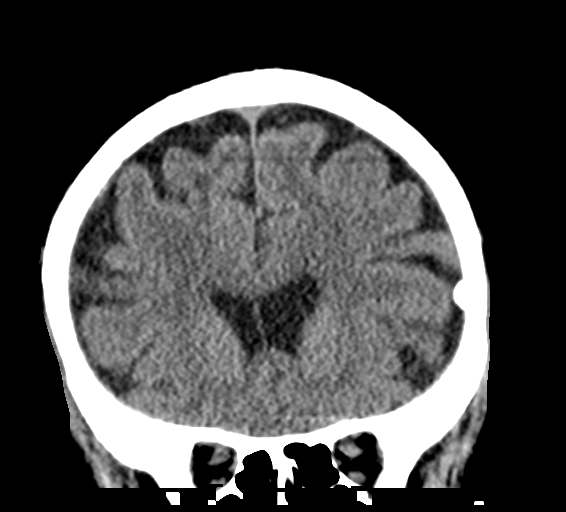
[im 29/64  brain]
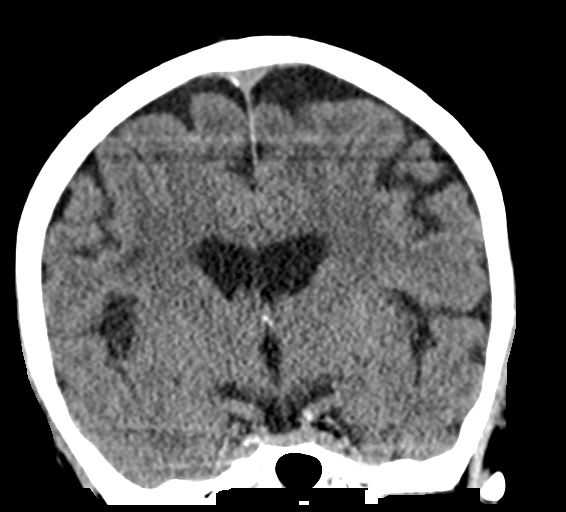
[im 36/64  brain]
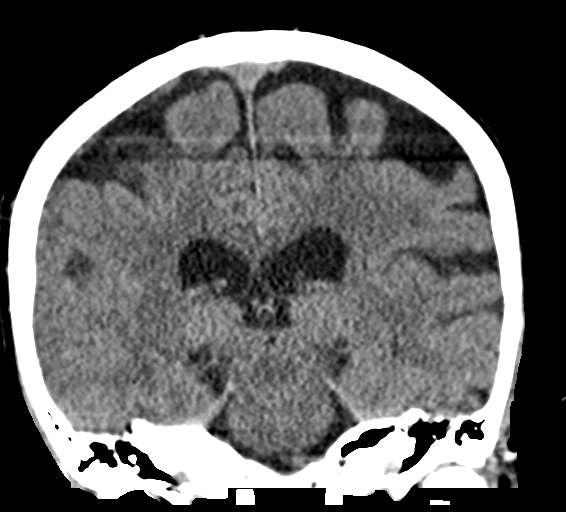

[Series 5: sag soft · sagittal · 0.29mm/px · 3 of 53 slices shown]
[im 18/53  brain]
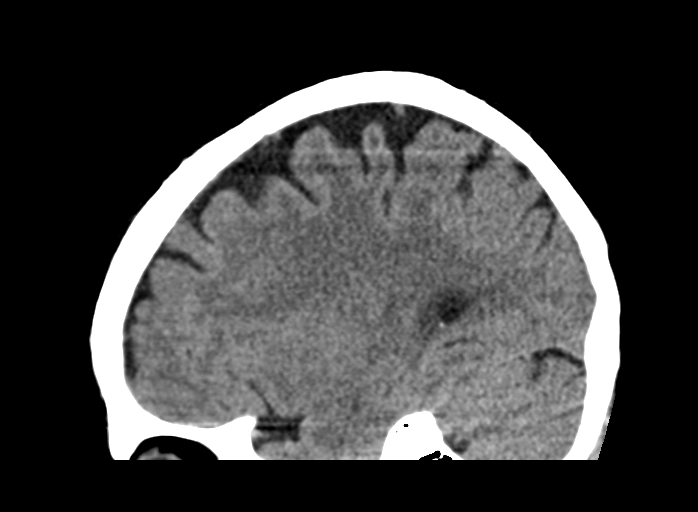
[im 27/53  brain]
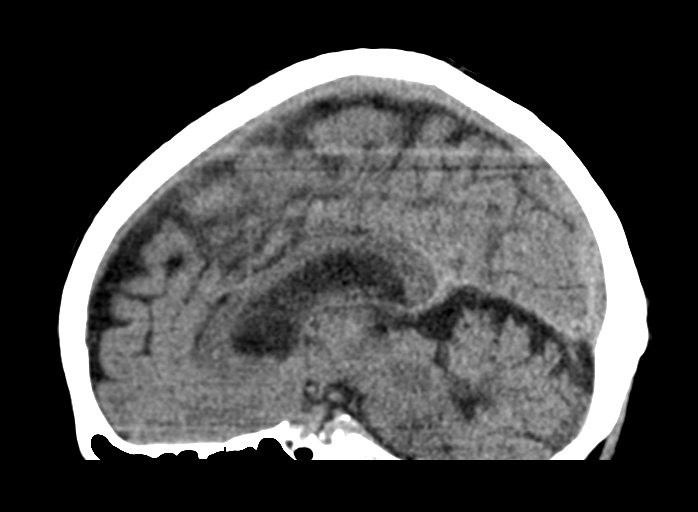
[im 35/53  brain]
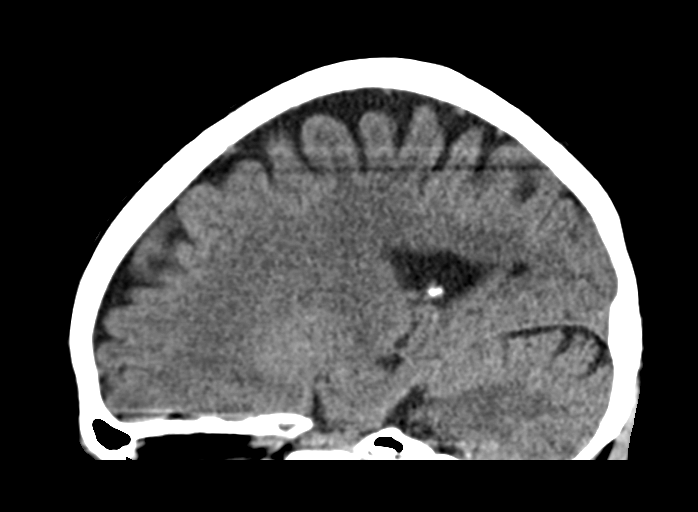

[15 of 46 positions shown; findings below may reference images not displayed]

FINDINGS: Brain: Normal ventricular morphology. No midline shift or mass
effect. Small vessel chronic ischemic changes of deep cerebral white
matter. No intracranial hemorrhage, mass lesion or evidence of acute
infarction. No extra-axial fluid collections. Lateral LEFT frontal
dural calcification unchanged.

Vascular: No hyperdense vessels

Skull: Small medial LEFT frontal scalp hematoma.  Calvaria intact.

Sinuses/Orbits: Clear

Other: N/A
IMPRESSION: Small vessel chronic ischemic changes of deep cerebral white matter.

No acute intracranial abnormalities.

## 2019-10-11 DIAGNOSIS — I4891 Unspecified atrial fibrillation: Secondary | ICD-10-CM | POA: Diagnosis not present

## 2019-10-11 DIAGNOSIS — J449 Chronic obstructive pulmonary disease, unspecified: Secondary | ICD-10-CM | POA: Diagnosis not present

## 2019-10-15 ENCOUNTER — Telehealth: Payer: HMO | Admitting: Internal Medicine

## 2019-10-19 ENCOUNTER — Other Ambulatory Visit: Payer: Self-pay | Admitting: *Deleted

## 2019-10-19 DIAGNOSIS — D500 Iron deficiency anemia secondary to blood loss (chronic): Secondary | ICD-10-CM

## 2019-10-19 DIAGNOSIS — D631 Anemia in chronic kidney disease: Secondary | ICD-10-CM

## 2019-10-19 DIAGNOSIS — D638 Anemia in other chronic diseases classified elsewhere: Secondary | ICD-10-CM

## 2019-10-20 ENCOUNTER — Inpatient Hospital Stay: Payer: HMO

## 2019-10-20 ENCOUNTER — Other Ambulatory Visit: Payer: Self-pay | Admitting: Family Medicine

## 2019-10-20 DIAGNOSIS — G4700 Insomnia, unspecified: Secondary | ICD-10-CM

## 2019-10-20 NOTE — Telephone Encounter (Signed)
Ambien refill.   Last OV: 08/07/2019 Last Fill: 09/23/2019 #30 and 0RF Pt sig: 1 tab qhs prn UDS 03/07/2017 Low risk

## 2019-10-22 ENCOUNTER — Other Ambulatory Visit: Payer: Self-pay | Admitting: Internal Medicine

## 2019-10-22 ENCOUNTER — Other Ambulatory Visit: Payer: Self-pay

## 2019-10-22 ENCOUNTER — Telehealth (INDEPENDENT_AMBULATORY_CARE_PROVIDER_SITE_OTHER): Payer: HMO | Admitting: Internal Medicine

## 2019-10-22 ENCOUNTER — Other Ambulatory Visit: Payer: Self-pay | Admitting: Family Medicine

## 2019-10-22 ENCOUNTER — Encounter: Payer: Self-pay | Admitting: Internal Medicine

## 2019-10-22 DIAGNOSIS — A310 Pulmonary mycobacterial infection: Secondary | ICD-10-CM

## 2019-10-22 DIAGNOSIS — R52 Pain, unspecified: Secondary | ICD-10-CM

## 2019-10-22 MED ORDER — OXYCODONE-ACETAMINOPHEN 10-325 MG PO TABS: 1.0000 | ORAL_TABLET | ORAL | 0 refills | Status: DC | PRN

## 2019-10-22 NOTE — Telephone Encounter (Signed)
Medication: oxyCODONE-acetaminophen (PERCOCET) 10-325 MG tablet [[id number]]       Has the patient contacted their pharmacy?  (If no, request that the patient contact the pharmacy for the refill.) (If yes, when and what did the pharmacy advise?)     Preferred Pharmacy (with phone number or street name): [facility], [address], [place] [address]  Phone:  [phone] Fax:  [phone]      Agent: Please be advised that RX refills may take up to 3 business days. We ask that you follow-up with your pharmacy.

## 2019-10-22 NOTE — Telephone Encounter (Signed)
Requesting: Percocet Contract: 11/06/2018 UDS: 11/06/2018 Last OV: 08/07/2019 Next OV: 11/02/19 w/ Melvenia Beam Last Refill: 09/25/2019, #120--0 RF Database:   Please advise

## 2019-10-22 NOTE — Progress Notes (Signed)
Virtual Visit via Telephone Note  I connected with [name] on 10/22/19 at  3:00 PM EDT by a video enabled telemedicine application and verified that I am speaking with the correct person using two identifiers.  Location: Patient: at home Provider: at clinic   I discussed the limitations of evaluation and management by telemedicine and the availability of in person appointments. The patient expressed understanding and agreed to proceed.  History of Present Illness: Started last Tuesday and appears to being okay with the current regimen   - rifabutin 2 x 150 mg capsules TIW - ethambutol 2 x 400 mg tablets + 2 x 100 mg tablets TIW - azithromycin 1 x 500 mg tablet TIW Observations/Objective:  Some diarrhea takes immodium as needed sometimes twice aday  Not having fever  no cough Some allergic rhinitis -- not been around in 3 wks from her friend ( friend who is positive) Wears mask all the time  She has come around about getting covid vaccine  Assessment and Plan: Telephone in 6 wks  Follow Up Instructions:  Will be getting covid vaccine Continue on current abtx   I discussed the assessment and treatment plan with the patient. The patient was provided an opportunity to ask questions and all were answered. The patient agreed with the plan and demonstrated an understanding of the instructions.   The patient was advised to call back or seek an in-person evaluation if the symptoms worsen or if the condition fails to improve as anticipated.  I provided 15 minutes of non-face-to-face time during this encounter.   Carlyle Basques, MD

## 2019-10-27 ENCOUNTER — Telehealth: Payer: Self-pay | Admitting: Internal Medicine

## 2019-10-27 MED ORDER — PANTOPRAZOLE SODIUM 40 MG PO TBEC: 40.0000 mg | DELAYED_RELEASE_TABLET | Freq: Two times a day (BID) | ORAL | 0 refills | Status: DC

## 2019-10-27 NOTE — Telephone Encounter (Signed)
I spoke with Mrs Frayne and set her up an October appointment and refilled her pantoprazole to cover her.

## 2019-10-27 NOTE — Telephone Encounter (Signed)
Patient requesting refill on Pantoprazole

## 2019-11-02 ENCOUNTER — Other Ambulatory Visit: Payer: Self-pay | Admitting: Family Medicine

## 2019-11-02 ENCOUNTER — Ambulatory Visit: Payer: HMO | Admitting: Pharmacist

## 2019-11-02 DIAGNOSIS — R52 Pain, unspecified: Secondary | ICD-10-CM

## 2019-11-02 DIAGNOSIS — I10 Essential (primary) hypertension: Secondary | ICD-10-CM

## 2019-11-02 DIAGNOSIS — I480 Paroxysmal atrial fibrillation: Secondary | ICD-10-CM

## 2019-11-02 DIAGNOSIS — J9611 Chronic respiratory failure with hypoxia: Secondary | ICD-10-CM

## 2019-11-02 NOTE — Chronic Care Management (AMB) (Signed)
Chronic Care Management Pharmacy  Name: [name]  MRN: [id number] DOB: 03-19-1946   Chief Complaint/ HPI  [name],  74 y.o. , female presents for their Follow-Up CCM visit with the clinical pharmacist via telephone visit.  PCP : Ann Held, DO  Their chronic conditions include: Afib, COPD, HF, HTN, HLD, CAD, Chronic Pain Syndrome, GERD, Insomnia  Office Visits: 08/07/19: Visit w/ Dr. Etter Sjogren [facility] follow up - No med changes noted.  Consult Visit: 10/22/19: ID visit w/ Dr. Baxter Flattery - No med changes noted.  09/29/19: Hem/onc visit w/ Laverna Peace, NP - No retacrit needed. RTC 3wks and 6 wks.   09/22/19: Cardio visit w/ Dr. Caryl Comes - D/C carvedilol.  09/08/19: ID visit w/ Dr. Baxter Flattery - MAI infection abx regimen:  - rifabutin 2 x 150 mg capsules on Tuesdays, Thursdays, and Saturdays - ethambutol 2 x 400 mg tablets + 2 x 100 mg tablets on Mondays, Wednesdays, Fridays - azithromycin 1 x 500 mg tablet on Mondays, Wednesdays, Fridays Resume MAI treatment  08/18/19: Hem/onc visit w/ Laverna Peace, NP - No retacrit needed. RTC 3wks and 6 wks.   08/05/19: Patient received Proventil HFA from [employer]  08/04/19: Cardio visit w/ Jodi Geralds, RN - hematoma reassessment. Minimal diffuse edema noted. Pt resumed Eliquis on 07/28/19 as instructed.  Medications: Outpatient Encounter Medications as of 11/02/2019  Medication Sig Note  . acetaminophen (TYLENOL) 650 MG CR tablet Take 650 mg by mouth every 8 (eight) hours as needed for pain.   [name] albuterol (PROVENTIL HFA) 108 (90 Base) MCG/ACT inhaler Inhale 2 puffs into the lungs every 6 (six) hours as needed for wheezing or shortness of breath. 07/15/2019: Gets from the [place]  . amLODipine (NORVASC) 2.5 MG tablet Take 1 tablet (2.5 mg total) by mouth at bedtime.   [name] atorvastatin (LIPITOR) 20 MG tablet TAKE 1 TABLET BY MOUTH ONCE DAILY AT  6PM (Patient taking differently: Take 20 mg by mouth every  evening. )   . denosumab (PROLIA) 60 MG/ML SOSY injection Inject 60 mg into the skin every 6 (six) months.  07/15/2019: Not yet re-started, per the patient  . ELIQUIS 5 MG TABS tablet Take 1 tablet by mouth twice daily   . furosemide (LASIX) 40 MG tablet TAKE 1 TABLET BY MOUTH IN THE MORNING (Patient taking differently: Take 40 mg by mouth in the morning. )   . gabapentin (NEURONTIN) 600 MG tablet Take 1 tablet by mouth 4 times daily   . lidocaine-prilocaine (EMLA) cream Apply 1 application topically as needed. (Patient taking differently: Apply 1 application topically as needed (for port access- to numb). )   . nitrofurantoin (MACRODANTIN) 100 MG capsule Take 100 mg by mouth daily.   . nitroGLYCERIN (NITROSTAT) 0.4 MG SL tablet Place 1 tablet (0.4 mg total) under the tongue every 5 (five) minutes as needed. CHEST PAIN (Patient taking differently: Place 0.4 mg under the tongue every 5 (five) minutes as needed for chest pain. )   . oxyCODONE-acetaminophen (PERCOCET) 10-325 MG tablet Take 1 tablet by mouth every 4 (four) hours as needed. PAIN   . OXYGEN Inhale 3 L/min into the lungs continuous.    . pantoprazole (PROTONIX) 40 MG tablet Take 1 tablet (40 mg total) by mouth 2 (two) times daily.   [name] tiZANidine (ZANAFLEX) 4 MG tablet Take 1 tablet (4 mg total) by mouth 3 (three) times daily as needed. for muscle spams   . triamcinolone ointment (KENALOG)  0.1 % Apply 1 application topically daily as needed (to affected areas- for psoriasis).    [name] umeclidinium bromide (INCRUSE ELLIPTA) 62.5 MCG/INH AEPB Inhale 1 puff into the lungs daily. 05/04/2019: Gets through [place] PAP  . zolpidem (AMBIEN) 5 MG tablet TAKE 1 TABLET BY MOUTH EVERY [name] AT BEDTIME AS NEEDED   . [DISCONTINUED] iron dextran complex (INFED) 50 MG/ML injection Please give infed infusion (no test dose needed) over 4 hours per pharmacy calculated dose. Ht: 5'2 Wt: 99 pounds Hgb: 12    No facility-administered encounter medications on file as of  11/02/2019.   Immunization History  Administered Date(s) Administered  . DTaP 11/03/1996, 01/04/1997, 05/03/1997, 10/19/1997, 08/05/2001  . Fluad Quad(high Dose 65+) 11/06/2018  . HPV Quadrivalent 09/05/2009, 09/13/2010, 09/26/2011  . Hepatitis A, Ped/Adol-2 Dose 09/21/2004, 03/29/2005  . Hepatitis B, ped/adol 04/30/1996, 11/03/1996, 10/19/1997  . HiB (PRP-T) 11/03/1996, 01/04/1997, 05/03/1997, 10/19/1997  . IPV 11/03/1996, 01/04/1997, 05/03/1997, 08/05/2001  . Influenza Split 01/16/2011, 12/12/2011  . Influenza Whole 01/15/2007, 12/08/2007, 12/28/2008, 12/17/2009  . Influenza, High Dose Seasonal PF 11/28/2015, 12/07/2016, 11/15/2017  . Influenza, Seasonal, Injecte, Preservative Fre 12/09/2012  . Influenza,Quad,Nasal, Live 03/17/2007, 03/06/2009, 01/08/2010, 01/30/2011, 02/09/2012, 01/18/2013  . Influenza,inj,Quad PF,6+ Mos 03/29/2005, 04/30/2005, 03/01/2006, 01/28/2008, 12/01/2013, 01/16/2014, 12/22/2014, 12/28/2014  . MMR 05/03/1997, 08/05/2001  . Meningococcal B, OMV 12/22/2014  . Pneumococcal Conjugate-13 06/10/2014, 11/15/2014  . Pneumococcal Polysaccharide-23 12/17/2009  . Tdap 09/17/2007, 05/15/2014, 12/09/2018  . Varicella 11/16/1997, 05/18/2008  . Zoster 12/31/2013   SDOH Screenings   Alcohol Screen:   . Last Alcohol Screening Score (AUDIT):   Depression (PHQ2-9): Low Risk   . PHQ-2 Score: 0  Financial Resource Strain: Low Risk   . Difficulty of Paying Living Expenses: Not very hard  Food Insecurity: No Food Insecurity  . Worried About Charity fundraiser in the Last Year: Never true  . Ran Out of Food in the Last Year: Never true  Housing:   . Last Housing Risk Score:   Physical Activity:   . Days of Exercise per Week:   . Minutes of Exercise per Session:   Social Connections:   . Frequency of Communication with Friends and Family:   . Frequency of Social Gatherings with Friends and Family:   . Attends Religious Services:   . Active Member of Clubs or  Organizations:   . Attends Archivist Meetings:   [name] Marital Status:   Stress:   . Feeling of Stress :   Tobacco Use: Medium Risk  . Smoking Tobacco Use: Former Smoker  . Smokeless Tobacco Use: Never Used  Transportation Needs: No Transportation Needs  . Lack of Transportation (Medical): No  . Lack of Transportation (Non-Medical): No     Current Diagnosis/Assessment:  Goals Addressed            This Visit's Progress   . Chronic Care Management Pharmacy Care Plan       CARE PLAN ENTRY (see longitudinal plan of care for additional care plan information)  Current Barriers:  . Chronic Disease Management support, education, and care coordination needs related to Afib, COPD, HF, HTN, HLD, CAD, Chronic Pain Syndrome, GERD, Insomnia   Hypertension BP Readings from Last 3 Encounters:  09/29/19 117/68  09/22/19 122/64  09/08/19 (!) 119/53   . Pharmacist Clinical Goal(s): o Over the next 90 days, patient will work with PharmD and providers to maintain BP goal <140/90 . Current regimen:  o Amlodipine 2.80m daily PM . Patient self care activities -  Over the next 90 days, patient will: o Maintain hypertension medication regimen.   Hyperlipidemia Lab Results  Component Value Date/Time   LDLCALC 54 05/11/2019 10:45 AM   . Pharmacist Clinical Goal(s): o Over the next 90 days, patient will work with PharmD and providers to maintain LDL goal < 70 . Current regimen:  o Atorvastatin 84m daily . Patient self care activities - Over the next 90 days, patient will: o Maintain cholesterol medication regimen.   Osteoporosis  . Pharmacist Clinical Goal(s) o Over the next 90 days, patient will work with PharmD and providers to reduce risk of fracture due to osteoporosis  . Current regimen:  o None . Interventions: o Discussed options of Prolia, but will defer until patient has completed MAI treatement . Patient self care activities - Over the next 180 days, patient  will: o Practice fall prevention strategies  Health Maintenance  . Pharmacist Clinical Goal(s) o Over the next year, patient will work with PharmD and providers to complete health maintenance screenings/vaccinations . Interventions: o Discussed consideration of completing Shingrix vaccine series (would wait until completion of MAI abx treatment) o Discussed consideration of completing COVID Vaccine Series . Patient self care activities - Over the next year, patient will: o Consider completion of Shingrix vaccine series (after completion of MAI treatment) o Consider completion of COVID Vaccine series   Medication management . Pharmacist Clinical Goal(s): o Over the next 90 days, patient will work with PharmD and providers to maintain optimal medication adherence . Current pharmacy: [facility] . Interventions o Comprehensive medication review performed. o Utilize [facility] pharmacy for medication synchronization, packaging and delivery . Patient self care activities - Over the next 90 days, patient will: o Focus on medication adherence by filling and taking medications appropriately  o Take medications as prescribed o Report any questions or concerns to PharmD and/or provider(s)  Please see past updates related to this goal by clicking on the "Past Updates" button in the selected goal       Social Hx Husband. 5 children (4 adopted (1 deceased), 1 biological) 2 god-children. 3 grandchildren and oldest [place]   Gets her hair done every Friday.   PAP Meds = ~ 1 month supply remaining  Morning meds in ziploc bag PM meds in ziploc bag PRN meds on kitchen counter Takes morning medicine between 7-8am Takes night medicine around 9-10pm   COPD / MAI / Tobacco   Last spirometry score:  10/10/2011-pulmonary function test-FVC 1.47 (54% predicted), postbronchodilator ratio 64, postbronchodilator FEV1 1.15 (59%  predicted), positive bronchodilator response, DLCO 5.3 (35% predicted)  Gold Grade: Gold 2 (FEV1 50-79%) Current COPD Classification:  B (high sx, <2 exacerbations/yr)  Eosinophil count:   Lab Results  Component Value Date/Time   EOSPCT 3 09/29/2019 10:50 AM   EOSPCT 1.6 04/01/2009 10:48 AM   EOSPCT 0.8 05/01/2007 08:45 AM  %                               Eos (Absolute):  Lab Results  Component Value Date/Time   EOSABS 0.1 09/29/2019 10:50 AM   EOSABS 0.1 04/01/2009 10:48 AM    Tobacco Status:  Social History   Tobacco Use  Smoking Status Former Smoker  . Packs/S[place]a Toothaker: 1.00  . Years: 20.00  . Pack years: 20.00  . Quit date: 03/19/1986  . Years since quitting: 33.6  Smokeless  Tobacco Never Used    Patient has failed these meds in past: None noted (S[employer] changed to [employer]) Patient is currently controlled on the following medications:   Incruse ellipta 62.53m/inh once daily (per Sood)  Proventil (per [place] Using maintenance inhaler regularly? Yes Frequency of rescue inhaler use:  daily after supper  Pt reports that her lung CT showed bacteria in lungs. Has televisit with Dr. SHalford Chessmanto come up with plan  Update 07/01/19  Will take antibiotics for 1.5 years to 2 years for MAI Has pill box for regimen outlined Met with pharmacist in ID office   Update 07/30/19 Was supposed to go to ID appt yesterday, but did not due to the weather. She was told to stop the MAI abx since her white count was trending down. Rescheduled to go back June 15 for ID follow up. States she is waiting until this appt to resume abx therapy. She is only taking the abx for her UTI (cephalexin 5063m  Update 11/02/19 States the abx are affecting her bowels, but she takes immodium and this helps. May need coordination with Incruse Ellipta and Proventil PAP.   Called Incruse Ellipta PAP ([facility]- Patient needs new PAP application. Will coordinate this for patient.  Called Proventil PAP ([employer])  - Eligible until 02/2020 and patient has 12 refills remaining with one available to be filled now. Requested refill for patient. She should receive it in 7-10 business days (8/25 - 8/30)   Plan -Continue current medications  -Complete [employer] PAP application Needs  Script from Dr. SoJuanetta Goslingffice  Patient signature/information  Pharmacy Out of Pocket expense of $600    Hypertension   BP Goal <140/90  Office blood pressures are  BP Readings from Last 3 Encounters:  09/29/19 117/68  09/22/19 122/64  09/08/19 (!) 119/53    Patient has failed these meds in the past: None noted  Patient is currently controlled on the following medications:   Amlodipine 2.59m859maily PM  Patient checks BP at home infrequently   Denies dizziness, lightheadedness  Update 07/01/19 Carvedilol: Currently taking 1 tab at night, no longer taking the half tab in the morning Has not been checking BP at home Wants to reduce BP medications Dr. HocPercival Spanishgeneral [facility]  Update 07/30/19 She is not checking home BP due to all that is going on with her health. Understanding of this and encouraged patient to contact me if there is anything I can do to assist.   Update 11/02/19 She was D/C on carvedilol due to low BP by Dr. KleCaryl Comestate she still has coldness at her fingertips, but her BP has been running on the low side without the carvedilol. "116/70 something...never over 120 for top or over 80 range for bottom"  Plan -Continue current medications   Pain    Patient has failed these meds in past: None noted  Patient is currently controlled on the following medications:   Gabapentin 600m23mtimes daily  Oxycodone 10-3259mg33mimes daily  Tizanidine 4mg T73m(only takes BID)  Acetaminophen 325 2 tabs PRN - uses every other Orissa Arreaga  Patient is concerned with there being confusion with the oxycodone scripts sent in from her ED visits vs the usual supply she receives from Dr. Lowne.Etter Sjogrenhas [facility]  [facility] not plan to pick up those prescriptions.  Encouraged pt to ask [facility] to cancel her current oxy scripts to not interfere with  prescribing from Dr. Etter Sjogren  Update 11/02/19 Did get her oxy scripts resolved.  Plan -Continue current medications   AFIB   Patient is currently controlled via pacemaker.  Patient has failed these meds in past: amiodarone, carvedilol  Patient is currently controlled on the following medications:   Eliquis 84m twice daily (per Dr. LEtter Sjogren  Do you have enough Eliquis through PAP??  She is on her last bottle of Eliquis. She has about 20 tabs remaining.  Will work to coordinate Eliquis PAP. Patient states she is willing to purchase at pharmacy if PAP medication not received in time.   Called Eliquis PAP ([place] and patient's application was no longer active as of December 2020. Will fill out new PAP application to get patient free supply of Eliquis if approved.   Plan -Continue current medications -Complete Eliquis PAP application Needs  Patient signature/information  Pharmacy out of pocket spend  Proof of Income  Signature page from Dr. LEtter Sjogren  Miscellaneous Meds Triamcinolone ointment - rarely uses (for psoriasis)

## 2019-11-02 NOTE — Patient Instructions (Signed)
Visit Information  Goals Addressed            This Visit's Progress   . Chronic Care Management Pharmacy Care Plan       CARE PLAN ENTRY (see longitudinal plan of care for additional care plan information)  Current Barriers:  . Chronic Disease Management support, education, and care coordination needs related to Afib, COPD, HF, HTN, HLD, CAD, Chronic Pain Syndrome, GERD, Insomnia   Hypertension BP Readings from Last 3 Encounters:  09/29/19 117/68  09/22/19 122/64  09/08/19 (!) 119/53   . Pharmacist Clinical Goal(s): o Over the next 90 days, patient will work with PharmD and providers to maintain BP goal <140/90 . Current regimen:  o Amlodipine 2.5mg  daily PM . Patient self care activities - Over the next 90 days, patient will: o Maintain hypertension medication regimen.   Hyperlipidemia Lab Results  Component Value Date/Time   LDLCALC 54 05/11/2019 10:45 AM   . Pharmacist Clinical Goal(s): o Over the next 90 days, patient will work with PharmD and providers to maintain LDL goal < 70 . Current regimen:  o Atorvastatin 20mg  daily . Patient self care activities - Over the next 90 days, patient will: o Maintain cholesterol medication regimen.   Osteoporosis  . Pharmacist Clinical Goal(s) o Over the next 90 days, patient will work with PharmD and providers to reduce risk of fracture due to osteoporosis  . Current regimen:  o None . Interventions: o Discussed options of Prolia, but will defer until patient has completed MAI treatement . Patient self care activities - Over the next 180 days, patient will: o Practice fall prevention strategies  Health Maintenance  . Pharmacist Clinical Goal(s) o Over the next year, patient will work with PharmD and providers to complete health maintenance screenings/vaccinations . Interventions: o Discussed consideration of completing Shingrix vaccine series (would wait until completion of MAI abx treatment) o Discussed consideration of  completing COVID Vaccine Series . Patient self care activities - Over the next year, patient will: o Consider completion of Shingrix vaccine series (after completion of MAI treatment) o Consider completion of COVID Vaccine series   Medication management . Pharmacist Clinical Goal(s): o Over the next 90 days, patient will work with PharmD and providers to maintain optimal medication adherence . Current pharmacy: [facility]  . Interventions o Comprehensive medication review performed. o Utilize [facility] pharmacy for medication synchronization, packaging and delivery . Patient self care activities - Over the next 90 days, patient will: o Focus on medication adherence by filling and taking medications appropriately  o Take medications as prescribed o Report any questions or concerns to PharmD and/or provider(s)  Please see past updates related to this goal by clicking on the "Past Updates" button in the selected goal        The patient verbalized understanding of instructions provided today and agreed to receive a mailed copy of patient instruction and/or educational materials.  Telephone follow up appointment with pharmacy team member scheduled for: 02/03/2020  Melvenia Beam Day, PharmD Clinical Pharmacist [facility] [phone]

## 2019-11-10 ENCOUNTER — Inpatient Hospital Stay: Payer: HMO

## 2019-11-10 ENCOUNTER — Other Ambulatory Visit: Payer: Self-pay

## 2019-11-10 ENCOUNTER — Inpatient Hospital Stay: Payer: HMO | Attending: Family

## 2019-11-10 ENCOUNTER — Telehealth: Payer: Self-pay | Admitting: Family Medicine

## 2019-11-10 ENCOUNTER — Inpatient Hospital Stay (HOSPITAL_BASED_OUTPATIENT_CLINIC_OR_DEPARTMENT_OTHER): Payer: HMO | Admitting: Family

## 2019-11-10 VITALS — BP 146/87 | HR 76 | Resp 20 | Wt 88.0 lb

## 2019-11-10 DIAGNOSIS — Z95828 Presence of other vascular implants and grafts: Secondary | ICD-10-CM

## 2019-11-10 DIAGNOSIS — D500 Iron deficiency anemia secondary to blood loss (chronic): Secondary | ICD-10-CM

## 2019-11-10 DIAGNOSIS — N189 Chronic kidney disease, unspecified: Secondary | ICD-10-CM | POA: Diagnosis not present

## 2019-11-10 DIAGNOSIS — D631 Anemia in chronic kidney disease: Secondary | ICD-10-CM | POA: Diagnosis not present

## 2019-11-10 LAB — CBC WITH DIFFERENTIAL (CANCER CENTER ONLY)
Abs Immature Granulocytes: 0.04 10*3/uL (ref 0.00–0.07)
Basophils Absolute: 0 10*3/uL (ref 0.0–0.1)
Basophils Relative: 0 %
Eosinophils Absolute: 0.2 10*3/uL (ref 0.0–0.5)
Eosinophils Relative: 8 %
HCT: 31.6 % — ABNORMAL LOW (ref 36.0–46.0)
Hemoglobin: 9.9 g/dL — ABNORMAL LOW (ref 12.0–15.0)
Immature Granulocytes: 2 %
Lymphocytes Relative: 19 %
Lymphs Abs: 0.5 10*3/uL — ABNORMAL LOW (ref 0.7–4.0)
MCH: 28.4 pg (ref 26.0–34.0)
MCHC: 31.3 g/dL (ref 30.0–36.0)
MCV: 90.8 fL (ref 80.0–100.0)
Monocytes Absolute: 0.4 10*3/uL (ref 0.1–1.0)
Monocytes Relative: 17 %
Neutro Abs: 1.4 10*3/uL — ABNORMAL LOW (ref 1.7–7.7)
Neutrophils Relative %: 54 %
Platelet Count: 172 10*3/uL (ref 150–400)
RBC: 3.48 MIL/uL — ABNORMAL LOW (ref 3.87–5.11)
RDW: 15.2 % (ref 11.5–15.5)
WBC Count: 2.5 10*3/uL — ABNORMAL LOW (ref 4.0–10.5)
nRBC: 0 % (ref 0.0–0.2)

## 2019-11-10 LAB — CMP (CANCER CENTER ONLY)
ALT: 10 U/L (ref 0–44)
AST: 18 U/L (ref 15–41)
Albumin: 3.9 g/dL (ref 3.5–5.0)
Alkaline Phosphatase: 122 U/L (ref 38–126)
Anion gap: 6 (ref 5–15)
BUN: 16 mg/dL (ref 8–23)
CO2: 29 mmol/L (ref 22–32)
Calcium: 7.9 mg/dL — ABNORMAL LOW (ref 8.9–10.3)
Chloride: 103 mmol/L (ref 98–111)
Creatinine: 0.74 mg/dL (ref 0.44–1.00)
GFR, Est AFR Am: >60 mL/min (ref >60)
GFR, Estimated: >60 mL/min (ref >60)
Glucose, Bld: 83 mg/dL (ref 70–99)
Potassium: 4 mmol/L (ref 3.5–5.1)
Sodium: 138 mmol/L (ref 135–145)
Total Bilirubin: 0.3 mg/dL (ref 0.3–1.2)
Total Protein: 6.9 g/dL (ref 6.5–8.1)

## 2019-11-10 LAB — RETICULOCYTES
Immature Retic Fract: 5.8 % (ref 2.3–15.9)
RBC.: 3.5 MIL/uL — ABNORMAL LOW (ref 3.87–5.11)
Retic Count, Absolute: 23.8 10*3/uL (ref 19.0–186.0)
Retic Ct Pct: 0.7 % (ref 0.4–3.1)

## 2019-11-10 MED ORDER — SODIUM CHLORIDE 0.9% FLUSH
10.0000 mL | INTRAVENOUS | Status: DC | PRN
  Administered 2019-11-10: 10 mL via INTRAVENOUS
  Filled 2019-11-10: qty 10

## 2019-11-10 MED ORDER — EPOETIN ALFA-EPBX 40000 UNIT/ML IJ SOLN
INTRAMUSCULAR | Status: AC
  Filled 2019-11-10: qty 1

## 2019-11-10 MED ORDER — HEPARIN SOD (PORK) LOCK FLUSH 100 UNIT/ML IV SOLN
500.0000 [IU] | Freq: Once | INTRAVENOUS | Status: AC
  Administered 2019-11-10: 500 [IU] via INTRAVENOUS
  Filled 2019-11-10: qty 5

## 2019-11-10 MED ORDER — EPOETIN ALFA-EPBX 40000 UNIT/ML IJ SOLN
40000.0000 [IU] | Freq: Once | INTRAMUSCULAR | Status: AC
  Administered 2019-11-10: 40000 [IU] via SUBCUTANEOUS

## 2019-11-10 NOTE — Patient Instructions (Signed)
Implanted Port Insertion, Care After °This sheet gives you information about how to care for yourself after your procedure. Your health care provider may also give you more specific instructions. If you have problems or questions, contact your health care provider. °What can I expect after the procedure? °After the procedure, it is common to have: °· Discomfort at the port insertion site. °· Bruising on the skin over the port. This should improve over 3-4 days. °Follow these instructions at home: °Port care °· After your port is placed, you will get a manufacturer's information card. The card has information about your port. Keep this card with you at all times. °· Take care of the port as told by your health care provider. Ask your health care provider if you or a family member can get training for taking care of the port at home. A home health care nurse may also take care of the port. °· Make sure to remember what type of port you have. °Incision care ° °  ° °· Follow instructions from your health care provider about how to take care of your port insertion site. Make sure you: °? Wash your hands with soap and water before and after you change your bandage (dressing). If soap and water are not available, use hand sanitizer. °? Change your dressing as told by your health care provider. °? Leave stitches (sutures), skin glue, or adhesive strips in place. These skin closures may need to stay in place for 2 weeks or longer. If adhesive strip edges start to loosen and curl up, you may trim the loose edges. Do not remove adhesive strips completely unless your health care provider tells you to do that. °· Check your port insertion site every day for signs of infection. Check for: °? Redness, swelling, or pain. °? Fluid or blood. °? Warmth. °? Pus or a bad smell. °Activity °· Return to your normal activities as told by your health care provider. Ask your health care provider what activities are safe for you. °· Do not  lift anything that is heavier than 10 lb (4.5 kg), or the limit that you are told, until your health care provider says that it is safe. °General instructions °· Take over-the-counter and prescription medicines only as told by your health care provider. °· Do not take baths, swim, or use a hot tub until your health care provider approves. Ask your health care provider if you may take showers. You may only be allowed to take sponge baths. °· Do not drive for 24 hours if you were given a sedative during your procedure. °· Wear a medical alert bracelet in case of an emergency. This will tell any health care providers that you have a port. °· Keep all follow-up visits as told by your health care provider. This is important. °Contact a health care provider if: °· You cannot flush your port with saline as directed, or you cannot draw blood from the port. °· You have a fever or chills. °· You have redness, swelling, or pain around your port insertion site. °· You have fluid or blood coming from your port insertion site. °· Your port insertion site feels warm to the touch. °· You have pus or a bad smell coming from the port insertion site. °Get help right away if: °· You have chest pain or shortness of breath. °· You have bleeding from your port that you cannot control. °Summary °· Take care of the port as told by your health   care provider. Keep the manufacturer's information card with you at all times. °· Change your dressing as told by your health care provider. °· Contact a health care provider if you have a fever or chills or if you have redness, swelling, or pain around your port insertion site. °· Keep all follow-up visits as told by your health care provider. °This information is not intended to replace advice given to you by your health care provider. Make sure you discuss any questions you have with your health care provider. °Document Revised: 10/01/2017 Document Reviewed: 10/01/2017 °Elsevier Patient Education ©  2020 [employer] Inc. ° °

## 2019-11-10 NOTE — Progress Notes (Signed)
Called patient and thanked her for completing the necessary documentation for her Eliquis and Incruse Ellipta Patient Child psychotherapist.   She still needs to provide her out of pocket pharmacy spend printout. Informed patient of this and she should be bringing it to the clinic.   Will place her original income documentation back in the envelope provided with her name on it to retrieve when she brings in her pharmacy spend printout.

## 2019-11-10 NOTE — Progress Notes (Signed)
Hematology and Oncology Follow Up Visit  [name] [id number] Aug 26, 1945 74 y.o. 11/10/2019   Principle Diagnosis:  Iron deficiency anemia Erythropoietin deficient anemia  Current Therapy: IV iron as indicated Retacrit 40,000 units SQfor hgb <11   Interim History:  [name] is here today for follow-up and Retacrit injection. Hgb today is 9.9.  She has noted some fatigue and mild increase in SOB with exertion. O2 sat on 3L [place] supplemental O2 was 100%.  She is on antibiotics with infectious disease for her lung infection (MAI) and expects this to be long term. She states that her low platelet count is a result of the antibiotics and has had this issue in the past and that the benefit of the antibiotic therapy outweigh the risks.  No fever, chills, n/v, rash, dizziness, chest pain, palpitations, abdominal pain or changes in bowel or bladder habits.  She has not noted any episodes of blood loss. No petechiae. She does bruise easily on Eliquis.  She takes imodium AD as needed for loose stool.  No swelling, tenderness in her extremities.  The numbness and tingling in her fingertips is stable/unchanged.  No falls or syncope.  She states that she has maintained a good appetite and is staying hydrated. Her weight is stable at 88 lbs.   ECOG Performance Status: 1 - Symptomatic but completely ambulatory  Medications:  Allergies as of 11/10/2019      Reactions   Dabigatran Etexilate Mesylate Other (See Comments)   INTERNAL BLEEDING- Pradaxa   Augmentin [amoxicillin-pot Clavulanate] Diarrhea, Nausea Only   Did it involve swelling of the face/tongue/throat, SOB, or low BP? No Did it involve sudden or severe rash/hives, skin peeling, or any reaction on the inside of your mouth or nose? No Did you need to seek medical attention at a hospital or doctor's office? No When did it last happen?02/2019 If all above answers are "NO", may proceed with cephalosporin use.   Talwin  [pentazocine] Other (See Comments)   Hallucinations      Medication List       Accurate as of November 10, 2019 11:22 AM. If you have any questions, ask your nurse or doctor.        acetaminophen 650 MG CR tablet Commonly known as: TYLENOL Take 650 mg by mouth every 8 (eight) hours as needed for pain.   amLODipine 2.5 MG tablet Commonly known as: NORVASC Take 1 tablet (2.5 mg total) by mouth at bedtime.   atorvastatin 20 MG tablet Commonly known as: LIPITOR TAKE 1 TABLET BY MOUTH ONCE DAILY AT  6PM What changed:   how much to take  how to take this  when to take this  additional instructions   denosumab 60 MG/ML Sosy injection Commonly known as: PROLIA Inject 60 mg into the skin every 6 (six) months.   Eliquis 5 MG Tabs tablet Generic drug: apixaban Take 1 tablet by mouth twice daily   furosemide 40 MG tablet Commonly known as: LASIX TAKE 1 TABLET BY MOUTH IN THE MORNING What changed: when to take this   gabapentin 600 MG tablet Commonly known as: NEURONTIN Take 1 tablet by mouth 4 times daily   Incruse Ellipta 62.5 MCG/INH Aepb Generic drug: umeclidinium bromide Inhale 1 puff into the lungs daily.   lidocaine-prilocaine cream Commonly known as: EMLA Apply 1 application topically as needed. What changed: reasons to take this   nitrofurantoin 100 MG capsule Commonly known as: MACRODANTIN Take 100 mg by mouth daily.  nitroGLYCERIN 0.4 MG SL tablet Commonly known as: NITROSTAT Place 1 tablet (0.4 mg total) under the tongue every 5 (five) minutes as needed. CHEST PAIN What changed:   reasons to take this  additional instructions   oxyCODONE-acetaminophen 10-325 MG tablet Commonly known as: PERCOCET Take 1 tablet by mouth every 4 (four) hours as needed. PAIN   OXYGEN Inhale 3 L/min into the lungs continuous.   pantoprazole 40 MG tablet Commonly known as: PROTONIX Take 1 tablet (40 mg total) by mouth 2 (two) times daily.   Proventil HFA 108  (90 Base) MCG/ACT inhaler Generic drug: albuterol Inhale 2 puffs into the lungs every 6 (six) hours as needed for wheezing or shortness of breath.   tiZANidine 4 MG tablet Commonly known as: ZANAFLEX Take 1 tablet by mouth three times daily as needed for muscle spasm   triamcinolone ointment 0.1 % Commonly known as: KENALOG Apply 1 application topically daily as needed (to affected areas- for psoriasis).   zolpidem 5 MG tablet Commonly known as: AMBIEN TAKE 1 TABLET BY MOUTH EVERY DAY AT BEDTIME AS NEEDED       Allergies:  Allergies  Allergen Reactions   Dabigatran Etexilate Mesylate Other (See Comments)    INTERNAL BLEEDING- Pradaxa   Augmentin [Amoxicillin-Pot Clavulanate] Diarrhea and Nausea Only    Did it involve swelling of the face/tongue/throat, SOB, or low BP? No Did it involve sudden or severe rash/hives, skin peeling, or any reaction on the inside of your mouth or nose? No Did you need to seek medical attention at a hospital or doctor's office? No When did it last happen?02/2019 If all above answers are "NO", may proceed with cephalosporin use.    Talwin [Pentazocine] Other (See Comments)    Hallucinations     Past Medical History, Surgical history, Social history, and Family History were reviewed and updated.  Review of Systems: All other 10 point review of systems is negative.   Physical Exam:  vitals were not taken for this visit.   Wt Readings from Last 3 Encounters:  09/29/19 86 lb (39 kg)  09/22/19 88 lb (39.9 kg)  09/08/19 90 lb 3.2 oz (40.9 kg)    Ocular: Sclerae unicteric, pupils equal, round and reactive to light Ear-nose-throat: Oropharynx clear, dentition fair Lymphatic: No cervical or supraclavicular adenopathy Lungs no rales or rhonchi, good excursion bilaterally Heart regular rate and rhythm, no murmur appreciated Abd soft, nontender, positive bowel sounds, no liver or spleen tip palpated on exam, no fluid wave  MSK no focal spinal  tenderness, no joint edema Neuro: non-focal, well-oriented, appropriate affect Breasts: Deferred   Lab Results  Component Value Date   WBC 3.1 (L) 09/29/2019   HGB 11.9 (L) 09/29/2019   HCT 39.1 09/29/2019   MCV 93.1 09/29/2019   PLT 218 09/29/2019   Lab Results  Component Value Date   FERRITIN 1,542 (H) 09/29/2019   IRON 76 09/29/2019   TIBC 181 (L) 09/29/2019   UIBC 105 (L) 09/29/2019   IRONPCTSAT 42 09/29/2019   Lab Results  Component Value Date   RETICCTPCT 0.5 09/29/2019   RBC 4.20 09/29/2019   RBC 4.16 09/29/2019   RETICCTABS 64.6 04/01/2009   No results found for: KPAFRELGTCHN, LAMBDASER, KAPLAMBRATIO No results found for: IGGSERUM, IGA, IGMSERUM No results found for: TOTALPROTELP, ALBUMINELP, A1GS, A2GS, BETS, BETA2SER, GAMS, MSPIKE, SPEI   Chemistry      Component Value Date/Time   NA 140 09/29/2019 1050   NA 135 06/16/2019 1333   K  4.6 09/29/2019 1050   CL 103 09/29/2019 1050   CO2 30 09/29/2019 1050   BUN 27 (H) 09/29/2019 1050   BUN 31 (H) 06/16/2019 1333   CREATININE 0.96 09/29/2019 1050   CREATININE 0.91 03/16/2016 1539      Component Value Date/Time   CALCIUM 8.5 (L) 09/29/2019 1050   ALKPHOS 215 (H) 09/29/2019 1050   AST 53 (H) 09/29/2019 1050   ALT 46 (H) 09/29/2019 1050   BILITOT 0.4 09/29/2019 1050       Impression and Plan: [name] is a very pleasant 74yo caucasian female with iron deficiency anemia secondary to malabsorption and possible GI blood loss with Eliquis. She received Retacrit today for Hgb 9.9.  We will see how her iron studies look and replace if needed.  We will see her again in another 3 weeks for lab and injection and follow-up with lab and injection in 6 weeks.  She can contact our office with any questions or concerns. We can certainly see her sooner if needed.   Laverna Peace, NP 8/24/202111:22 AM'

## 2019-11-10 NOTE — Telephone Encounter (Signed)
noted 

## 2019-11-10 NOTE — Telephone Encounter (Signed)
Pt's spouse dropped off document to be filled out by provider ( In Yellow big envelope - [place] [facility] [place] 8 pages) Pt would like to be called if any questions Tel [phone]. Pt wanted to leave document with Pharmacist first then with provider. Document put at front office tray under pharmacist name.

## 2019-11-11 ENCOUNTER — Other Ambulatory Visit: Payer: Self-pay

## 2019-11-11 DIAGNOSIS — J449 Chronic obstructive pulmonary disease, unspecified: Secondary | ICD-10-CM | POA: Diagnosis not present

## 2019-11-11 DIAGNOSIS — I4891 Unspecified atrial fibrillation: Secondary | ICD-10-CM | POA: Diagnosis not present

## 2019-11-11 LAB — IRON AND TIBC
Iron: 56 ug/dL (ref 41–142)
Saturation Ratios: 28 % (ref 21–57)
TIBC: 199 ug/dL — ABNORMAL LOW (ref 236–444)
UIBC: 143 ug/dL (ref 120–384)

## 2019-11-11 LAB — FERRITIN: Ferritin: 771 ng/mL — ABNORMAL HIGH (ref 11–307)

## 2019-11-11 NOTE — Patient Outreach (Signed)
[facility]) Care Management Chronic Special Needs Program  11/11/2019  Name: [name] DOB: Aug 05, 1945  MRN: [id number]  [name] is enrolled in a chronic special needs plan for Heart Failure. Reviewed and updated care plan.  Subjective:Client states that she has been doing as good as she can.  States she is wearing her O2 most of the time and she gets short of breath is she tries to go any time without it.  States she gets short of breath with walking but it is at her normal level.  Denies any swelling and she states she is weighing daily.  States her weight ranges from 88-89 lbs.  States she does not like supplements but tries to eat low sodium healthy diet. States her defibrillator site is completely healed.  States she has applied for assistance with her Eliquis. Denies any issues with atrial fibrillation.  States her B/P is usually good when she checks it at home.  States she is checking her B/P about once a week.  States the [facility] doctor saw her in July.  States she has not gotten her COVID shots and has decided to not take them for now.  Goals Addressed              This Visit's Progress   .  Check Blood Pressure 2 to 3 times per week   Not on track     Reports checking once a week Encouraged to check 2-3 times a week    .  COMPLETED: Client understands the importance of follow-up with providers by attending scheduled visits   On track     Keeping scheduled appointments with providers Reinforced to keep scheduled appointments with providers Goal completed 11/11/19    .  Client will report no worsening of symptoms of Atrial Fibrillation within the next 6 months(continued 11/11/19)   On track     Reviewed signs, symptoms and monitoring for worsening atrial fibrillation Reviewed atrial fibrillation action plan Reviewed importance of taking anticoagulant to prevent stroke    .  Client will report weighing and recording weights daily within next 6  months(continue 11/11/19   On track     Reinforced importance of weighting daily and to record weights Reinforced heart failure zones and when to call MD    .  Client will verbalize knowledge of chronic lung disease as evidenced by no ED visits or Inpatient stays related to chronic lung disease    On track     Reports no changes in COPD with no ED or hospital visits Reinforced COPD action plan in [employer]([facility]) calendar      .  Client will verbalize knowledge of self management of Hypertension as evidences by BP reading of 140/90 or less; or as defined by provider   On track     Reports very good B/P control when checking at home Plan to check B/P regularly Take B/P medications as ordered Plan to follow a low salt diet  Increase activity as tolerated    .  Consider Getting COVID Vaccine Series   Not on track     Discussed benefits of getting COVID vaccination Encouraged to consider getting vaccination and to discuss with her primary care doctor    .  Decrease inpatient Heart Failure admissions/ readmissions with in the year   On track     No Heart failure admission in 2020 Signs and symptoms of heart failure reviewed Education provided for self-management of Heart  Disease. Advised to notify doctor for symptoms Call 911 for severe symptoms Weigh daily and record weighs Follow a low salt diet    .  COMPLETED: Maintain timely refills of Heart Failure medication as prescribed within the year    On track     Maintaining timely refills of medications per dispense report Reinforced importance of getting medications refilled on time Goal completed 11/11/19    .  COMPLETED: Obtain annual  Lipid Profile, LDL-C   On track     Last completed 05/11/19 LDL 54 Plan to take statin as ordered Goal completed 11/11/19    .  Patient Stated plan to walk 15 minutes 3 x a week for the next 6 months(continued 11/11/19) (pt-stated)   Not on track     Plan to pace your activity Plan to take  your cell phone and or family member with you Plan to avoid walking when weather is hot     .  Visit Primary Care Provider or Cardiologist at least 2 times per year   On track     Primary care provider 05/07/19, 08/07/19 Cardiology 09/22/19 [facility] provider 10/09/19 next scheduled visit 12/25/19 Please schedule your annual wellness visit     [facility] completed visit on 10/09/19 and next visit scheduled for 12/25/19  Plan:  Send successful outreach letter with a copy of their individualized care plan and Send individual care plan to provider  Chronic care management coordinator will review [facility]'s plan of care and will collaborate with [facility] team as indicated  Client will be outreached by a [employer]) RNCM in 6 months per tier level     [place], [name], [place] Management Coordinator [place] [facility] [phone]

## 2019-11-18 ENCOUNTER — Other Ambulatory Visit: Payer: Self-pay | Admitting: Family Medicine

## 2019-11-18 DIAGNOSIS — R52 Pain, unspecified: Secondary | ICD-10-CM

## 2019-11-18 DIAGNOSIS — G4700 Insomnia, unspecified: Secondary | ICD-10-CM

## 2019-11-18 MED ORDER — OXYCODONE-ACETAMINOPHEN 10-325 MG PO TABS: 1.0000 | ORAL_TABLET | ORAL | 0 refills | Status: DC | PRN

## 2019-11-18 NOTE — Telephone Encounter (Signed)
Ambien refill.   Last OV: 08/07/2019 Last Fill; 10/20/2019 #30 and 0RF Pt sig: 1 tab qhs prn UDS: None recent

## 2019-11-18 NOTE — Telephone Encounter (Signed)
Requesting: Percocet Contract: 11/06/2018 UDS: 11/06/2018 Last OV: 08/07/2019 Next OV: 02/03/2020 w/ Melvenia Beam Last Refill: 10/22/2019, #120--0 RF Database:   Please advise

## 2019-11-18 NOTE — Telephone Encounter (Signed)
Medication: oxyCODONE-acetaminophen (PERCOCET) 10-325 MG tablet   Has the patient contacted their pharmacy? No. (If no, request that the patient contact the pharmacy for the refill.) (If yes, when and what did the pharmacy advise?)  Preferred Pharmacy (with phone number or street name):  [facility], [address] [place], [place] [address]  Phone:  [phone] Fax:  [phone]  Agent: Please be advised that RX refills may take up to 3 business days. We ask that you follow-up with your pharmacy.

## 2019-11-26 ENCOUNTER — Other Ambulatory Visit: Payer: Self-pay

## 2019-11-26 ENCOUNTER — Telehealth (INDEPENDENT_AMBULATORY_CARE_PROVIDER_SITE_OTHER): Payer: HMO | Admitting: Internal Medicine

## 2019-11-26 DIAGNOSIS — A310 Pulmonary mycobacterial infection: Secondary | ICD-10-CM

## 2019-11-26 NOTE — Progress Notes (Signed)
Virtual Visit via Telephone Note  I connected with [name] on 11/26/19 at  4:15 PM EDT by telephone and verified that I am speaking with the correct person using two identifiers.  Location: Patient: at home Provider: in clinic   I discussed the limitations, risks, security and privacy concerns of performing an evaluation and management service by telephone and the availability of in person appointments. I also discussed with the patient that there may be a patient responsible charge related to this service. The patient expressed understanding and agreed to proceed.   History of Present Illness: 74yo F with bronchiectasis, pulmonary MAC restarted on 3 drug TIW    tolerating medications. occ loose stool No blurry vision Not having croup like cough anymore Observations/Objective:   Assessment and Plan: Lets plan on continue medications as is Lets plan seeing back in 6-8 wks  Follow Up Instructions:    I discussed the assessment and treatment plan with the patient. The patient was provided an opportunity to ask questions and all were answered. The patient agreed with the plolan and demonstrated an understanding of the instructions.   The patient was advised to call back or seek an in-person evaluation if the symptoms worsen or if the condition fails to improve as anticipated.  I provided 10 minutes of non-face-to-face time during this encounter.   Carlyle Basques, MD

## 2019-12-01 ENCOUNTER — Inpatient Hospital Stay: Payer: HMO

## 2019-12-01 ENCOUNTER — Other Ambulatory Visit: Payer: Self-pay

## 2019-12-01 ENCOUNTER — Inpatient Hospital Stay: Payer: HMO | Attending: Family

## 2019-12-01 VITALS — BP 141/63 | HR 79 | Temp 98.2°F | Resp 22

## 2019-12-01 DIAGNOSIS — D500 Iron deficiency anemia secondary to blood loss (chronic): Secondary | ICD-10-CM

## 2019-12-01 DIAGNOSIS — N189 Chronic kidney disease, unspecified: Secondary | ICD-10-CM | POA: Insufficient documentation

## 2019-12-01 DIAGNOSIS — D631 Anemia in chronic kidney disease: Secondary | ICD-10-CM

## 2019-12-01 LAB — CBC WITH DIFFERENTIAL (CANCER CENTER ONLY)
Abs Immature Granulocytes: 0.01 10*3/uL (ref 0.00–0.07)
Basophils Absolute: 0 10*3/uL (ref 0.0–0.1)
Basophils Relative: 1 %
Eosinophils Absolute: 0.1 10*3/uL (ref 0.0–0.5)
Eosinophils Relative: 4 %
HCT: 34.2 % — ABNORMAL LOW (ref 36.0–46.0)
Hemoglobin: 10.8 g/dL — ABNORMAL LOW (ref 12.0–15.0)
Immature Granulocytes: 0 %
Lymphocytes Relative: 18 %
Lymphs Abs: 0.5 10*3/uL — ABNORMAL LOW (ref 0.7–4.0)
MCH: 28.6 pg (ref 26.0–34.0)
MCHC: 31.6 g/dL (ref 30.0–36.0)
MCV: 90.5 fL (ref 80.0–100.0)
Monocytes Absolute: 0.5 10*3/uL (ref 0.1–1.0)
Monocytes Relative: 17 %
Neutro Abs: 1.6 10*3/uL — ABNORMAL LOW (ref 1.7–7.7)
Neutrophils Relative %: 60 %
Platelet Count: 227 10*3/uL (ref 150–400)
RBC: 3.78 MIL/uL — ABNORMAL LOW (ref 3.87–5.11)
RDW: 15.1 % (ref 11.5–15.5)
WBC Count: 2.8 10*3/uL — ABNORMAL LOW (ref 4.0–10.5)
nRBC: 0 % (ref 0.0–0.2)

## 2019-12-01 LAB — CMP (CANCER CENTER ONLY)
ALT: 8 U/L (ref 0–44)
AST: 18 U/L (ref 15–41)
Albumin: 3.9 g/dL (ref 3.5–5.0)
Alkaline Phosphatase: 113 U/L (ref 38–126)
Anion gap: 7 (ref 5–15)
BUN: 24 mg/dL — ABNORMAL HIGH (ref 8–23)
CO2: 30 mmol/L (ref 22–32)
Calcium: 8.2 mg/dL — ABNORMAL LOW (ref 8.9–10.3)
Chloride: 102 mmol/L (ref 98–111)
Creatinine: 0.9 mg/dL (ref 0.44–1.00)
GFR, Est AFR Am: >60 mL/min (ref >60)
GFR, Estimated: >60 mL/min (ref >60)
Glucose, Bld: 88 mg/dL (ref 70–99)
Potassium: 4.3 mmol/L (ref 3.5–5.1)
Sodium: 139 mmol/L (ref 135–145)
Total Bilirubin: 0.4 mg/dL (ref 0.3–1.2)
Total Protein: 6.9 g/dL (ref 6.5–8.1)

## 2019-12-01 MED ORDER — SODIUM CHLORIDE 0.9% FLUSH
10.0000 mL | INTRAVENOUS | Status: DC | PRN
  Administered 2019-12-01: 10 mL
  Filled 2019-12-01: qty 10

## 2019-12-01 MED ORDER — HEPARIN SOD (PORK) LOCK FLUSH 100 UNIT/ML IV SOLN
500.0000 [IU] | Freq: Once | INTRAVENOUS | Status: AC | PRN
  Administered 2019-12-01: 500 [IU]
  Filled 2019-12-01: qty 5

## 2019-12-01 MED ORDER — EPOETIN ALFA-EPBX 40000 UNIT/ML IJ SOLN
40000.0000 [IU] | Freq: Once | INTRAMUSCULAR | Status: AC
  Administered 2019-12-01: 40000 [IU] via SUBCUTANEOUS

## 2019-12-01 NOTE — Patient Instructions (Signed)

## 2019-12-01 NOTE — Patient Instructions (Signed)

## 2019-12-07 ENCOUNTER — Telehealth: Payer: Self-pay | Admitting: Family Medicine

## 2019-12-07 NOTE — Telephone Encounter (Signed)
If we have she can have some Also tell them to google --- eliquis pat assistance there is a phone number they can call to see if they can get it from company

## 2019-12-07 NOTE — Telephone Encounter (Signed)
Pt's spouse stated pt is needing if possible sample of meds Eliquis, pt has to take it and it is too expensive for pt to buy and wanted to get some samples if any at the office. Please advise. Pt tel [phone].

## 2019-12-07 NOTE — Telephone Encounter (Signed)
Okay for samples? Please advise

## 2019-12-07 NOTE — Telephone Encounter (Signed)
Pt's spouse dropped off document to be filled out by provider ( [place] - 19 pages attached with yellow large envelope) Pt would like document to be mailed out or faxed when ready - pt included yellow large envelope with stamps. Document put at front office tray under providers name.

## 2019-12-08 NOTE — Telephone Encounter (Signed)
Samples placed at the front. Pt notified.

## 2019-12-08 NOTE — Telephone Encounter (Signed)
done

## 2019-12-08 NOTE — Telephone Encounter (Signed)
Forms received and given to [facility]

## 2019-12-08 NOTE — Telephone Encounter (Signed)
Forms returned and faxed to [facility] Patient assistance [employer]

## 2019-12-11 ENCOUNTER — Other Ambulatory Visit: Payer: Self-pay | Admitting: Family Medicine

## 2019-12-11 DIAGNOSIS — R52 Pain, unspecified: Secondary | ICD-10-CM

## 2019-12-12 DIAGNOSIS — I4891 Unspecified atrial fibrillation: Secondary | ICD-10-CM | POA: Diagnosis not present

## 2019-12-12 DIAGNOSIS — J449 Chronic obstructive pulmonary disease, unspecified: Secondary | ICD-10-CM | POA: Diagnosis not present

## 2019-12-14 ENCOUNTER — Other Ambulatory Visit: Payer: Self-pay | Admitting: Family Medicine

## 2019-12-14 DIAGNOSIS — R52 Pain, unspecified: Secondary | ICD-10-CM

## 2019-12-14 MED ORDER — OXYCODONE-ACETAMINOPHEN 10-325 MG PO TABS: 1.0000 | ORAL_TABLET | ORAL | 0 refills | Status: DC | PRN

## 2019-12-14 NOTE — Telephone Encounter (Signed)
Oxycodone refill.   Last OV: 08/07/2019 Last Fill: 11/18/2019 #120 and 0RF Pt sig: 1 tab q4h prn UDS: 03/07/2017

## 2019-12-14 NOTE — Telephone Encounter (Signed)
Medication: oxyCODONE-acetaminophen (PERCOCET) 10-325 MG tablet [[id number]]      Has the patient contacted their pharmacy?  (If no, request that the patient contact the pharmacy for the refill.) (If yes, when and what did the pharmacy advise?)     Preferred Pharmacy (with phone number or street name): [facility], [address] [place], [place] [address]  Phone:  [phone] Fax:  [phone]      Agent: Please be advised that RX refills may take up to 3 business days. We ask that you follow-up with your pharmacy.

## 2019-12-18 ENCOUNTER — Ambulatory Visit (INDEPENDENT_AMBULATORY_CARE_PROVIDER_SITE_OTHER): Payer: HMO

## 2019-12-18 ENCOUNTER — Other Ambulatory Visit: Payer: Self-pay | Admitting: Family Medicine

## 2019-12-18 DIAGNOSIS — I480 Paroxysmal atrial fibrillation: Secondary | ICD-10-CM

## 2019-12-18 DIAGNOSIS — G4700 Insomnia, unspecified: Secondary | ICD-10-CM

## 2019-12-18 DIAGNOSIS — I428 Other cardiomyopathies: Secondary | ICD-10-CM | POA: Diagnosis not present

## 2019-12-18 NOTE — Telephone Encounter (Signed)
Requesting: Ambien 5mg  Contract: 11/06/2018 UDS: 05/30/2017 in hospital Last Visit: 08/07/2019 Next Visit: None scheduled Last Refill: 11/18/2019 #30 and 0RF Pt sig: 1 tab qhs prn  Please Advise

## 2019-12-22 ENCOUNTER — Inpatient Hospital Stay: Payer: HMO

## 2019-12-22 ENCOUNTER — Inpatient Hospital Stay (HOSPITAL_BASED_OUTPATIENT_CLINIC_OR_DEPARTMENT_OTHER): Payer: HMO | Admitting: Family

## 2019-12-22 ENCOUNTER — Encounter: Payer: Self-pay | Admitting: Family

## 2019-12-22 ENCOUNTER — Other Ambulatory Visit: Payer: Self-pay

## 2019-12-22 ENCOUNTER — Inpatient Hospital Stay: Payer: HMO | Attending: Family

## 2019-12-22 VITALS — BP 143/77 | HR 72 | Temp 98.4°F | Resp 18 | Ht 60.0 in | Wt 87.0 lb

## 2019-12-22 DIAGNOSIS — Z79899 Other long term (current) drug therapy: Secondary | ICD-10-CM | POA: Diagnosis not present

## 2019-12-22 DIAGNOSIS — N189 Chronic kidney disease, unspecified: Secondary | ICD-10-CM | POA: Diagnosis not present

## 2019-12-22 DIAGNOSIS — Z9981 Dependence on supplemental oxygen: Secondary | ICD-10-CM | POA: Insufficient documentation

## 2019-12-22 DIAGNOSIS — D508 Other iron deficiency anemias: Secondary | ICD-10-CM | POA: Diagnosis not present

## 2019-12-22 DIAGNOSIS — D631 Anemia in chronic kidney disease: Secondary | ICD-10-CM

## 2019-12-22 DIAGNOSIS — D500 Iron deficiency anemia secondary to blood loss (chronic): Secondary | ICD-10-CM

## 2019-12-22 DIAGNOSIS — Z95828 Presence of other vascular implants and grafts: Secondary | ICD-10-CM

## 2019-12-22 DIAGNOSIS — Z7901 Long term (current) use of anticoagulants: Secondary | ICD-10-CM | POA: Diagnosis not present

## 2019-12-22 DIAGNOSIS — K909 Intestinal malabsorption, unspecified: Secondary | ICD-10-CM | POA: Diagnosis not present

## 2019-12-22 LAB — CUP PACEART REMOTE DEVICE CHECK
Battery Remaining Longevity: 115 mo
Battery Voltage: 3.1 V
Brady Statistic AP VP Percent: 0.32 %
Brady Statistic AP VS Percent: 0.01 %
Brady Statistic AS VP Percent: 99.47 %
Brady Statistic AS VS Percent: 0.21 %
Brady Statistic RA Percent Paced: 0.33 %
Brady Statistic RV Percent Paced: 99.79 %
Date Time Interrogation Session: 20211001055359
Implantable Lead Implant Date: 20111005
Implantable Lead Implant Date: 20111005
Implantable Lead Implant Date: 20111005
Implantable Lead Location: 753858
Implantable Lead Location: 753859
Implantable Lead Location: 753860
Implantable Lead Model: 4196
Implantable Lead Model: 5076
Implantable Lead Model: 5076
Implantable Pulse Generator Implant Date: 20210402
Lead Channel Impedance Value: 266 Ohm
Lead Channel Impedance Value: 304 Ohm
Lead Channel Impedance Value: 342 Ohm
Lead Channel Impedance Value: 380 Ohm
Lead Channel Impedance Value: 380 Ohm
Lead Channel Impedance Value: 399 Ohm
Lead Channel Impedance Value: 456 Ohm
Lead Channel Impedance Value: 475 Ohm
Lead Channel Impedance Value: 608 Ohm
Lead Channel Pacing Threshold Amplitude: 0.5 V
Lead Channel Pacing Threshold Amplitude: 0.625 V
Lead Channel Pacing Threshold Pulse Width: 0.4 ms
Lead Channel Pacing Threshold Pulse Width: 0.4 ms
Lead Channel Sensing Intrinsic Amplitude: 0.625 mV
Lead Channel Sensing Intrinsic Amplitude: 0.625 mV
Lead Channel Sensing Intrinsic Amplitude: 17.375 mV
Lead Channel Sensing Intrinsic Amplitude: 17.375 mV
Lead Channel Setting Pacing Amplitude: 1.25 V
Lead Channel Setting Pacing Amplitude: 2 V
Lead Channel Setting Pacing Amplitude: 2.5 V
Lead Channel Setting Pacing Pulse Width: 0.4 ms
Lead Channel Setting Pacing Pulse Width: 0.8 ms
Lead Channel Setting Sensing Sensitivity: 0.9 mV

## 2019-12-22 LAB — CMP (CANCER CENTER ONLY)
ALT: 92 U/L — ABNORMAL HIGH (ref 0–44)
AST: 109 U/L — ABNORMAL HIGH (ref 15–41)
Albumin: 4.1 g/dL (ref 3.5–5.0)
Alkaline Phosphatase: 224 U/L — ABNORMAL HIGH (ref 38–126)
Anion gap: 6 (ref 5–15)
BUN: 17 mg/dL (ref 8–23)
CO2: 30 mmol/L (ref 22–32)
Calcium: 8.6 mg/dL — ABNORMAL LOW (ref 8.9–10.3)
Chloride: 101 mmol/L (ref 98–111)
Creatinine: 0.88 mg/dL (ref 0.44–1.00)
GFR, Estimated: >60 mL/min (ref >60)
Glucose, Bld: 96 mg/dL (ref 70–99)
Potassium: 4.3 mmol/L (ref 3.5–5.1)
Sodium: 137 mmol/L (ref 135–145)
Total Bilirubin: 0.6 mg/dL (ref 0.3–1.2)
Total Protein: 7 g/dL (ref 6.5–8.1)

## 2019-12-22 LAB — CBC WITH DIFFERENTIAL (CANCER CENTER ONLY)
Abs Immature Granulocytes: 0.05 10*3/uL (ref 0.00–0.07)
Basophils Absolute: 0 10*3/uL (ref 0.0–0.1)
Basophils Relative: 1 %
Eosinophils Absolute: 0.2 10*3/uL (ref 0.0–0.5)
Eosinophils Relative: 6 %
HCT: 35.3 % — ABNORMAL LOW (ref 36.0–46.0)
Hemoglobin: 11.1 g/dL — ABNORMAL LOW (ref 12.0–15.0)
Immature Granulocytes: 1 %
Lymphocytes Relative: 11 %
Lymphs Abs: 0.4 10*3/uL — ABNORMAL LOW (ref 0.7–4.0)
MCH: 28.8 pg (ref 26.0–34.0)
MCHC: 31.4 g/dL (ref 30.0–36.0)
MCV: 91.7 fL (ref 80.0–100.0)
Monocytes Absolute: 0.5 10*3/uL (ref 0.1–1.0)
Monocytes Relative: 13 %
Neutro Abs: 2.6 10*3/uL (ref 1.7–7.7)
Neutrophils Relative %: 68 %
Platelet Count: 200 10*3/uL (ref 150–400)
RBC: 3.85 MIL/uL — ABNORMAL LOW (ref 3.87–5.11)
RDW: 14.8 % (ref 11.5–15.5)
WBC Count: 3.8 10*3/uL — ABNORMAL LOW (ref 4.0–10.5)
nRBC: 0 % (ref 0.0–0.2)

## 2019-12-22 LAB — RETICULOCYTES
Immature Retic Fract: 2.4 % (ref 2.3–15.9)
RBC.: 3.78 MIL/uL — ABNORMAL LOW (ref 3.87–5.11)
Retic Count, Absolute: 25.3 10*3/uL (ref 19.0–186.0)
Retic Ct Pct: 0.7 % (ref 0.4–3.1)

## 2019-12-22 MED ORDER — SODIUM CHLORIDE 0.9% FLUSH
10.0000 mL | Freq: Once | INTRAVENOUS | Status: AC
  Administered 2019-12-22: 10 mL via INTRAVENOUS
  Filled 2019-12-22: qty 10

## 2019-12-22 MED ORDER — HEPARIN SOD (PORK) LOCK FLUSH 100 UNIT/ML IV SOLN
500.0000 [IU] | Freq: Once | INTRAVENOUS | Status: AC
  Administered 2019-12-22: 500 [IU] via INTRAVENOUS
  Filled 2019-12-22: qty 5

## 2019-12-22 NOTE — Patient Instructions (Signed)
Tunneled Central Venous Catheter Flushing Guide  It is important to flush your tunneled central venous catheter each time you use it, both before and after you use it. Flushing your catheter will help prevent it from clogging. What are the risks? Risks may include:  Infection.  Air getting into the catheter and bloodstream. Supplies needed:  A clean pair of gloves.  A disinfecting wipe. Use an alcohol wipe, chlorhexidine wipe, or iodine wipe as told by your health care provider.  A 10 mL syringe that has been prefilled with saline solution.  An empty 10 mL syringe, if a substance called heparin was injected into your catheter. How to flush your catheter When you flush your catheter, make sure you follow any specific instructions from your health care provider or the manufacturer. These are general guidelines. Flushing your catheter before use If there is heparin in your catheter: 1. Wash your hands with soap and water. 2. Put on gloves. 3. Scrub the injection cap for a minimum of 15 seconds with a disinfecting wipe. 4. Unclamp the catheter. 5. Attach the empty syringe to the injection cap. 6. Pull the syringe plunger back and withdraw 10 mL of blood. 7. Place the syringe into an appropriate waste container. 8. Scrub the injection cap for 15 seconds with a disinfecting wipe. 9. Attach the prefilled syringe to the injection cap. 10. Flush the catheter by pushing the plunger forward until all the liquid from the syringe is in the catheter. 11. Remove the syringe from the injection cap. 12. Clamp the catheter. If there is no heparin in your catheter: 1. Wash your hands with soap and water. 2. Put on gloves. 3. Scrub the injection cap for 15 seconds with a disinfecting wipe. 4. Unclamp the catheter. 5. Attach the prefilled syringe to the injection cap. 6. Flush the catheter by pushing the plunger forward until 5 mL of the liquid from the syringe is in the catheter. 7. Pull back on  the syringe until you see blood in the catheter. 8. If you have been asked to collect any blood, follow your health care provider's instructions. Otherwise, flush the catheter with the rest of the solution from the syringe. 9. Remove the syringe from the injection cap. 10. Clamp the catheter.  Flushing your catheter after use 1. Wash your hands with soap and water. 2. Put on gloves. 3. Scrub the injection cap for 15 seconds with a disinfecting wipe. 4. Unclamp the catheter. 5. Attach the prefilled syringe to the injection cap. 6. Flush the catheter by pushing the plunger forward until all of the liquid from the syringe is in the catheter. 7. Remove the syringe from the injection cap. 8. Clamp the catheter. Problems and solutions  If blood cannot be completely cleared from the injection cap, you may need to have the injection cap replaced.  If the catheter is difficult to flush, use the pulsing method. The pulsing method involves pushing only a few milliliters of solution into the catheter at a time and pausing between pushes.  If you do not see blood in the catheter when you pull back on the syringe, change your body position, such as by raising your arms above your head. Take a deep breath and cough. Then, pull back on the syringe. If you still do not see blood, flush the catheter with a small amount of solution. Then, change positions again and take a breath or cough. Pull back on the syringe again. If you still do not see   blood, finish flushing the catheter and contact your health care provider. Do not use your catheter until your health care provider says it is okay. General tips  Have someone help you flush your catheter, if possible.  Do not force fluid through your catheter.  Do not use a syringe that is larger or smaller than 10 mL. Using a smaller syringe can make the catheter burst.  Do not use your catheter without flushing it first if it has heparin in it. Contact a health  care provider if:  You cannot see any blood in the catheter when you flush it before using it.  Your catheter is difficult to flush. Get help right away if:  You cannot flush the catheter.  The catheter leaks when you flush it or when there is fluid in it.  There are cracks or breaks in the catheter. Summary  It is important to flush your tunneled central venous catheter each time you use it, both before and after you use it.  Scrub the injection cap for 15 seconds with a disinfecting wipe before and after you flush it.  When you flush your catheter, make sure you follow any specific instructions from your health care provider or the manufacturer.  Get help right away if you cannot flush the catheter. This information is not intended to replace advice given to you by your health care provider. Make sure you discuss any questions you have with your health care provider. Document Revised: 11/28/2018 Document Reviewed: 05/21/2018 Elsevier Patient Education  2020 [employer]  

## 2019-12-22 NOTE — Progress Notes (Signed)
Hematology and Oncology Follow Up Visit  [name] [id number] [birth year] 74 y.o. 12/22/2019   Principle Diagnosis:  Iron deficiency anemia Erythropoietin deficient anemia  Current Therapy: IV iron as indicated Retacrit 40,000 units SQfor hgb <11   Interim History:  [name] is here today for follow-up. She is doping fairly well but has been having random episodes of mid abdominal pain that will radiate into her back. She is asymptomatic right now.  Her LFT's are quite elevated today. I have routed today's lab work to Dr. Carlean Purl with GI for further eval. She has an appointment with him on Monday 12/28/2019. Hgb is stable at 11.1, MCV 91, WBC count 3.8, platelets 200.  She states that her SOB is stable on 3L [place] 24 hours a day.  No fever, chills, n/v, cough, rash, dizziness, chest pain, palpitations, abdominal pain or changes in bowel or bladder habits.  No swelling or tenderness in her extremities.  The neuropathy in her fingertips is unchanged/stable.  No falls or syncopal episodes to report.  She is eating well and is staying well hydrated. Her weight is stable at 87 lbs.   ECOG Performance Status: 1 - Symptomatic but completely ambulatory  Medications:  Allergies as of 12/22/2019      Reactions   Dabigatran Etexilate Mesylate Other (See Comments)   INTERNAL BLEEDING- Pradaxa   Augmentin [amoxicillin-pot Clavulanate] Diarrhea, Nausea Only   Did it involve swelling of the face/tongue/throat, SOB, or low BP? No Did it involve sudden or severe rash/hives, skin peeling, or any reaction on the inside of your mouth or nose? No Did you need to seek medical attention at a hospital or doctor's office? No When did it last happen?02/2019 If all above answers are "NO", may proceed with cephalosporin use.   Talwin [pentazocine] Other (See Comments)   Hallucinations      Medication List       Accurate as of December 22, 2019 11:27 AM. If you have any questions, ask  your nurse or doctor.        acetaminophen 650 MG CR tablet Commonly known as: TYLENOL Take 650 mg by mouth every 8 (eight) hours as needed for pain.   amLODipine 2.5 MG tablet Commonly known as: NORVASC Take 1 tablet (2.5 mg total) by mouth at bedtime.   atorvastatin 20 MG tablet Commonly known as: LIPITOR TAKE 1 TABLET BY MOUTH ONCE DAILY AT  6PM What changed:   how much to take  how to take this  when to take this  additional instructions   azithromycin 500 MG tablet Commonly known as: ZITHROMAX Take 500 mg by mouth 3 (three) times a week.   denosumab 60 MG/ML Sosy injection Commonly known as: PROLIA Inject 60 mg into the skin every 6 (six) months.   Eliquis 5 MG Tabs tablet Generic drug: apixaban Take 1 tablet by mouth twice daily   ethambutol 100 MG tablet Commonly known as: MYAMBUTOL Take by mouth.   ethambutol 400 MG tablet Commonly known as: MYAMBUTOL Take by mouth.   furosemide 40 MG tablet Commonly known as: LASIX TAKE 1 TABLET BY MOUTH IN THE MORNING What changed: when to take this   gabapentin 600 MG tablet Commonly known as: NEURONTIN Take 1 tablet by mouth 4 times daily   Incruse Ellipta 62.5 MCG/INH Aepb Generic drug: umeclidinium bromide Inhale 1 puff into the lungs daily.   lidocaine-prilocaine cream Commonly known as: EMLA Apply 1 application topically as needed. What changed: reasons to take  this   nitrofurantoin 100 MG capsule Commonly known as: MACRODANTIN Take 100 mg by mouth daily.   nitroGLYCERIN 0.4 MG SL tablet Commonly known as: NITROSTAT Place 1 tablet (0.4 mg total) under the tongue every 5 (five) minutes as needed. CHEST PAIN What changed:   reasons to take this  additional instructions   oxyCODONE-acetaminophen 10-325 MG tablet Commonly known as: PERCOCET Take 1 tablet by mouth every 4 (four) hours as needed. PAIN   OXYGEN Inhale 3 L/min into the lungs continuous.   pantoprazole 40 MG tablet Commonly  known as: PROTONIX Take 1 tablet (40 mg total) by mouth 2 (two) times daily.   Proventil HFA 108 (90 Base) MCG/ACT inhaler Generic drug: albuterol Inhale 2 puffs into the lungs every 6 (six) hours as needed for wheezing or shortness of breath.   rifabutin 150 MG capsule Commonly known as: MYCOBUTIN SMARTSIG:2 Capsule(s) By Mouth 3 Times a Week   tiZANidine 4 MG tablet Commonly known as: ZANAFLEX Take 1 tablet by mouth three times daily as needed for muscle spasm   triamcinolone ointment 0.1 % Commonly known as: KENALOG Apply 1 application topically daily as needed (to affected areas- for psoriasis).   zolpidem 5 MG tablet Commonly known as: AMBIEN TAKE 1 TABLET BY MOUTH EVERY DAY AT BEDTIME AS NEEDED       Allergies:  Allergies  Allergen Reactions  . Dabigatran Etexilate Mesylate Other (See Comments)    INTERNAL BLEEDING- Pradaxa  . Augmentin [Amoxicillin-Pot Clavulanate] Diarrhea and Nausea Only    Did it involve swelling of the face/tongue/throat, SOB, or low BP? No Did it involve sudden or severe rash/hives, skin peeling, or any reaction on the inside of your mouth or nose? No Did you need to seek medical attention at a hospital or doctor's office? No When did it last happen?02/2019 If all above answers are "NO", may proceed with cephalosporin use.   [name] [Pentazocine] Other (See Comments)    Hallucinations     Past Medical History, Surgical history, Social history, and Family History were reviewed and updated.  Review of Systems: All other 10 point review of systems is negative.   Physical Exam:  vitals were not taken for this visit.   Wt Readings from Last 3 Encounters:  11/10/19 88 lb (39.9 kg)  09/29/19 86 lb (39 kg)  09/22/19 88 lb (39.9 kg)    Ocular: Sclerae unicteric, pupils equal, round and reactive to light Ear-nose-throat: Oropharynx clear, dentition fair Lymphatic: No cervical or supraclavicular adenopathy Lungs no rales or rhonchi, good  excursion bilaterally Heart regular rate and rhythm, no murmur appreciated Abd soft, nontender, positive bowel sounds, no liver or spleen tip palpated on exam, no fluid wave  MSK no focal spinal tenderness, no joint edema Neuro: non-focal, well-oriented, appropriate affect Breasts: Deferred   Lab Results  Component Value Date   WBC 2.8 (L) 12/01/2019   HGB 10.8 (L) 12/01/2019   HCT 34.2 (L) 12/01/2019   MCV 90.5 12/01/2019   PLT 227 12/01/2019   Lab Results  Component Value Date   FERRITIN 771 (H) 11/10/2019   IRON 56 11/10/2019   TIBC 199 (L) 11/10/2019   UIBC 143 11/10/2019   IRONPCTSAT 28 11/10/2019   Lab Results  Component Value Date   RETICCTPCT 0.7 11/10/2019   RBC 3.78 (L) 12/01/2019   RETICCTABS 64.6 04/01/2009   No results found for: KPAFRELGTCHN, LAMBDASER, KAPLAMBRATIO No results found for: IGGSERUM, IGA, IGMSERUM No results found for: TOTALPROTELP, ALBUMINELP, A1GS, A2GS,  [place] Sers, SPEI   Chemistry      Component Value Date/Time   NA 139 12/01/2019 1435   NA 135 06/16/2019 1333   K 4.3 12/01/2019 1435   CL 102 12/01/2019 1435   CO2 30 12/01/2019 1435   BUN 24 (H) 12/01/2019 1435   BUN 31 (H) 06/16/2019 1333   CREATININE 0.90 12/01/2019 1435   CREATININE 0.91 03/16/2016 1539      Component Value Date/Time   CALCIUM 8.2 (L) 12/01/2019 1435   ALKPHOS 113 12/01/2019 1435   AST 18 12/01/2019 1435   ALT 8 12/01/2019 1435   BILITOT 0.4 12/01/2019 1435       Impression and Plan: [name] is a very pleasant 74yo caucasian female with iron deficiency anemia secondary to malabsorption and possible GI blood loss with Eliquis. No Retacrit needed today for Hgb 11.1.  Iron studies are pending and we will replace if needed.  Lab and injection in 3 weeks, follow-up in 6 weeks.  She can contact our office with any questions or concerns. We can certainly see her sooner of needed.   Laverna Peace, NP 10/5/202111:27 AM

## 2019-12-23 LAB — IRON AND TIBC
Iron: 107 ug/dL (ref 41–142)
Saturation Ratios: 57 % (ref 21–57)
TIBC: 189 ug/dL — ABNORMAL LOW (ref 236–444)
UIBC: 81 ug/dL — ABNORMAL LOW (ref 120–384)

## 2019-12-23 LAB — FERRITIN: Ferritin: 1026 ng/mL — ABNORMAL HIGH (ref 11–307)

## 2019-12-24 ENCOUNTER — Telehealth: Payer: Self-pay | Admitting: Pharmacist

## 2019-12-24 ENCOUNTER — Other Ambulatory Visit: Payer: Self-pay

## 2019-12-24 ENCOUNTER — Ambulatory Visit (HOSPITAL_COMMUNITY)
Admission: RE | Admit: 2019-12-24 | Discharge: 2019-12-24 | Disposition: A | Discharge status: Home / Self Care | Payer: HMO | Source: Ambulatory Visit | Attending: Internal Medicine | Admitting: Internal Medicine

## 2019-12-24 DIAGNOSIS — K8050 Calculus of bile duct without cholangitis or cholecystitis without obstruction: Secondary | ICD-10-CM | POA: Diagnosis not present

## 2019-12-24 DIAGNOSIS — R945 Abnormal results of liver function studies: Secondary | ICD-10-CM

## 2019-12-24 DIAGNOSIS — R1011 Right upper quadrant pain: Secondary | ICD-10-CM | POA: Diagnosis not present

## 2019-12-24 DIAGNOSIS — R7989 Other specified abnormal findings of blood chemistry: Secondary | ICD-10-CM

## 2019-12-24 NOTE — Progress Notes (Addendum)
Chronic Care Management Pharmacy Assistant   Name: [name]  MRN: [id number] DOB: Mar 13, 1946  Reason for Encounter: Disease State  Patient Questions:  1.  Have you seen any other providers since your last visit? Yes  2.  Any changes in your medicines or health? No   PCP : Ann Held, DO   Their chronic conditions include: Afib, COPD, HF, HTN, HLD, CAD, Chronic Pain Syndrome, GERD, Insomnia.  Office Visits: 11-10-2019 (PCP) Patient presented   Consults: 12-22-2019 (Oncology) Patient presented in the office for f/u with iron deficiency anemia secondary to malabsorption and possible GI blood loss with Eliquis.No Retacrit needed today for Hgb 11.1. Notes indicate Iron studies are pending and we will replace if needed. Lab and injection in 3 weeks, follow-up in 6 weeks.  12-22-2019 (Oncology) Portacath flushed 12-01-2019 Oncology) Iron infusion  11-26-2019 (Inf Disease) Patient presented via video with Dr. Baxter Flattery for evaluation of bronchiectasis, pulmonary MAC. Patient reported tolerating medications. occ loose stool No blurry vision Not having croup like cough anymore There were no medication changes. Patient to f/u in 6-8 weeks.  11-10-2019 (Oncology) - Portacath placed 11-10-2019 (Oncology) Patient presented in the office for f/u. She received Retacrit for Hgb 9.9 at the time of the visit. No medication changes.    Allergies:   Allergies  Allergen Reactions   Dabigatran Etexilate Mesylate Other (See Comments)    INTERNAL BLEEDING- Pradaxa   Augmentin [Amoxicillin-Pot Clavulanate] Diarrhea and Nausea Only    Did it involve swelling of the face/tongue/throat, SOB, or low BP? No Did it involve sudden or severe rash/hives, skin peeling, or any reaction on the inside of your mouth or nose? No Did you need to seek medical attention at a hospital or doctor's office? No When did it last happen?02/2019 If all above answers are "NO", may proceed with cephalosporin  use.    Talwin [Pentazocine] Other (See Comments)    Hallucinations     Medications: Outpatient Encounter Medications as of 12/24/2019  Medication Sig Note   acetaminophen (TYLENOL) 650 MG CR tablet Take 650 mg by mouth every 8 (eight) hours as needed for pain.    albuterol (PROVENTIL HFA) 108 (90 Base) MCG/ACT inhaler Inhale 2 puffs into the lungs every 6 (six) hours as needed for wheezing or shortness of breath. 07/15/2019: Gets from the [employer] Patient Assistance Program   amLODipine (NORVASC) 2.5 MG tablet Take 1 tablet (2.5 mg total) by mouth at bedtime.    atorvastatin (LIPITOR) 20 MG tablet TAKE 1 TABLET BY MOUTH ONCE DAILY AT  6PM (Patient taking differently: Take 20 mg by mouth every evening. )    azithromycin (ZITHROMAX) 500 MG tablet Take 500 mg by mouth 3 (three) times a week.    denosumab (PROLIA) 60 MG/ML SOSY injection Inject 60 mg into the skin every 6 (six) months.  07/15/2019: Not yet re-started, per the patient   ELIQUIS 5 MG TABS tablet Take 1 tablet by mouth twice daily    ethambutol (MYAMBUTOL) 100 MG tablet Take by mouth.    ethambutol (MYAMBUTOL) 400 MG tablet Take by mouth.    furosemide (LASIX) 40 MG tablet TAKE 1 TABLET BY MOUTH IN THE MORNING (Patient taking differently: Take 40 mg by mouth in the morning. )    gabapentin (NEURONTIN) 600 MG tablet Take 1 tablet by mouth 4 times daily    lidocaine-prilocaine (EMLA) cream Apply 1 application topically as needed. (Patient taking differently: Apply 1 application topically as needed (for  port access- to numb). )    nitrofurantoin (MACRODANTIN) 100 MG capsule Take 100 mg by mouth daily.    nitroGLYCERIN (NITROSTAT) 0.4 MG SL tablet Place 1 tablet (0.4 mg total) under the tongue every 5 (five) minutes as needed. CHEST PAIN (Patient not taking: Reported on 12/22/2019)    oxyCODONE-acetaminophen (PERCOCET) 10-325 MG tablet Take 1 tablet by mouth every 4 (four) hours as needed. PAIN    OXYGEN Inhale 3 L/min into  the lungs continuous.     pantoprazole (PROTONIX) 40 MG tablet Take 1 tablet (40 mg total) by mouth 2 (two) times daily.    rifabutin (MYCOBUTIN) 150 MG capsule SMARTSIG:2 Capsule(s) By Mouth 3 Times a Week    tiZANidine (ZANAFLEX) 4 MG tablet Take 1 tablet by mouth three times daily as needed for muscle spasm    triamcinolone ointment (KENALOG) 0.1 % Apply 1 application topically daily as needed (to affected areas- for psoriasis).     umeclidinium bromide (INCRUSE ELLIPTA) 62.5 MCG/INH AEPB Inhale 1 puff into the lungs daily. 05/04/2019: Gets through [place] PAP   zolpidem (AMBIEN) 5 MG tablet TAKE 1 TABLET BY MOUTH EVERY DAY AT BEDTIME AS NEEDED    [DISCONTINUED] iron dextran complex (INFED) 50 MG/ML injection Please give infed infusion (no test dose needed) over 4 hours per pharmacy calculated dose. Ht: 5'2 Wt: 99 pounds Hgb: 12    No facility-administered encounter medications on file as of 12/24/2019.    Current Diagnosis: Patient Active Problem List   Diagnosis Date Noted   Hematoma 08/07/2019   Other neutropenia ([place])    Post-procedural fever 07/20/2019   Acute febrile illness 07/16/2019   Pulmonary MAI (mycobacterium avium-intracellulare) infection ([place]) 06/24/2019   History of total right hip replacement 05/27/2019   NICM (nonischemic cardiomyopathy) ([place]) 05/19/2019   Educated about COVID-19 virus infection 09/07/2018   Erythropoietin deficiency anemia 06/19/2018   Swelling of calf 02/04/2018   Bad odor of urine 12/09/2017   Iron deficiency anemia due to chronic blood loss 08/14/2017   Iron malabsorption 08/14/2017   Hyperlipidemia LDL goal <100 07/08/2017   Elevated ALT measurement    Hypotension 06/20/2017   HLD (hyperlipidemia) 06/20/2017   CAD (coronary artery disease) 06/20/2017   Protein-calorie malnutrition, severe ([place]) 06/20/2017   Generalized weakness    Weakness 05/30/2017   Focal neurological deficit 05/30/2017   Chronic diastolic  CHF (congestive heart failure) ([place]) 05/30/2017   TIA (transient ischemic attack) 05/30/2017   AKI (acute kidney injury) ([place]) 05/30/2017   Abnormal LFTs 05/30/2017   Bronchiectasis ([place]) 04/22/2017   Right otitis media 07/17/2016   Cerumen impaction 07/17/2016   Osteoporosis 03/16/2016   Headache 11/20/2014   Skin infection 05/27/2014   Insomnia 05/27/2014   Abdominal wall pain 07/09/2013   Other dysphagia 02/02/2013   Platelets decreased ([place]) 01/29/2012   Vitamin D deficiency 12/13/2011   Hypokalemia 12/13/2011   Hypocalcemia 12/12/2011   Adhesions of intestine, very dense, with frozen abdomen 12/11/2011   SBO (small bowel obstruction), recurrent 12/11/2011   Protein-calorie malnutrition, moderate ([place]) 12/11/2011   Oxygen dependent    Hx of gastric ulcer    UTI (urinary tract infection) 10/15/2011   Allergic conjunctivitis 10/15/2011   Degenerative arthritis of hip 08/03/2011   B12 deficiency 07/02/2011   Bilateral leg weakness 01/16/2011   Anemia, chronic disease 09/14/2010   Recurrent pneumonia 08/11/2010   CRT- [employer] 06/26/2010   DYSURIA 05/19/2010   Chronic hypoxemic respiratory failure ([place]) 02/21/2010   DEHYDRATION 01/06/2010   GASTROENTERITIS 01/06/2010  Congestive dilated cardiomyopathy ([place]) 11/17/2009   LBBB 03/54/6568   SYSTOLIC HEART FAILURE, CHRONIC 11/17/2009   GERD (gastroesophageal reflux disease) 02/17/2009   NONSPECIFIC ABN FINDNG RAD&OTH EXAM BILARY TRCT 09/03/2008   COPD with emphysema ([place]) 06/28/2008   DIVERTICULOSIS, COLON 06/03/2008   GASTRITIS 06/02/2008   Atrial fibrillation/flutter 12/08/2007   GASTROPARESIS 08/07/2007   NAUSEA, CHRONIC 08/07/2007   Abdominal pain 08/07/2007   SYNCOPE 02/17/2007   RESTLESS LEG SYNDROME 08/01/2006   Chronic pain syndrome 08/01/2006   OSTEOARTHRITIS 07/31/2006   Degenerative disc disease, lumbar 07/31/2006   Fibromyalgia 07/31/2006   CHRONIC  FATIGUE SYNDROME 07/31/2006   URINARY INCONTINENCE 07/31/2006   ANEMIA-IRON DEFICIENCY 05/01/2006   Essential hypertension 05/01/2006    Goals Addressed   None    Reviewed chart prior to disease state call. Spoke with patient regarding BP  Recent Office Vitals: BP Readings from Last 3 Encounters:  12/22/19 (!) 143/77  12/01/19 (!) 141/63  11/10/19 (!) 146/87   Pulse Readings from Last 3 Encounters:  12/22/19 72  12/01/19 79  11/10/19 76    Wt Readings from Last 3 Encounters:  12/22/19 87 lb (39.5 kg)  11/10/19 88 lb (39.9 kg)  09/29/19 86 lb (39 kg)     Kidney Function Lab Results  Component Value Date/Time   CREATININE 0.88 12/22/2019 11:42 AM   CREATININE 0.90 12/01/2019 02:35 PM   CREATININE 0.91 03/16/2016 03:39 PM   CREATININE 0.74 01/09/2013 02:53 PM   GFR 71.19 08/18/2018 01:08 PM   GFRNONAA >60 12/22/2019 11:42 AM   GFRAA >60 12/01/2019 02:35 PM    BMP Latest Ref Rng & Units 12/22/2019 12/01/2019 11/10/2019  Glucose 70 - 99 mg/dL 96 88 83  BUN 8 - 23 mg/dL 17 24(H) 16  Creatinine 0.44 - 1.00 mg/dL 0.88 0.90 0.74  BUN/Creat Ratio 12 - 28 - - -  Sodium 135 - 145 mmol/L 137 139 138  Potassium 3.5 - 5.1 mmol/L 4.3 4.3 4.0  Chloride 98 - 111 mmol/L 101 102 103  CO2 22 - 32 mmol/L _0 Calcium 8.9 - 10.3 mg/dL 8.6(L) 8.2(L) 7.9(L)     Current antihypertensive regimen:  o Amlodipine 2.84m daily PM patient reports taking her medication daily without side effects   How often are you checking your Blood Pressure? infrequently. Patient states she doesn't check her BP at home often because she is at the doctor's office so frequently.   Current home BP readings: None provided at the time of the call   What recent interventions/DTPs have been made by any provider to improve Blood Pressure control since last CPP Visit: None at this time   Any recent hospitalizations or ED visits since last visit with CPP? No    What diet changes have been made to  improve Blood Pressure Control?  o None   What exercise is being done to improve your Blood Pressure Control?  o None  Adherence Review: Is the patient currently on ACE/ARB medication? No Does the patient have >5 day gap between last estimated fill dates? No    Follow-Up:  Pharmacist Review   IFanny Skates CEscondidaPharmacist Assistant [phone] Noted >5 day gap history per dispense report. Need to assure adherence. --CBSWFill Dates Per Dispense Report - Filled Inappropriately  Atorvastatin: 08/04/19 - 90DS  Reviewed by: KDe Blanch PharmD Clinical Pharmacist LHanoverPrimary Care at [phone]

## 2019-12-28 ENCOUNTER — Other Ambulatory Visit (INDEPENDENT_AMBULATORY_CARE_PROVIDER_SITE_OTHER): Payer: HMO

## 2019-12-28 ENCOUNTER — Ambulatory Visit: Payer: HMO | Admitting: Internal Medicine

## 2019-12-28 ENCOUNTER — Encounter: Payer: Self-pay | Admitting: Internal Medicine

## 2019-12-28 VITALS — BP 140/80 | HR 80 | Ht 60.0 in | Wt 89.3 lb

## 2019-12-28 DIAGNOSIS — R1013 Epigastric pain: Secondary | ICD-10-CM | POA: Diagnosis not present

## 2019-12-28 DIAGNOSIS — K219 Gastro-esophageal reflux disease without esophagitis: Secondary | ICD-10-CM | POA: Diagnosis not present

## 2019-12-28 DIAGNOSIS — R945 Abnormal results of liver function studies: Secondary | ICD-10-CM | POA: Diagnosis not present

## 2019-12-28 DIAGNOSIS — R7989 Other specified abnormal findings of blood chemistry: Secondary | ICD-10-CM

## 2019-12-28 LAB — HEPATIC FUNCTION PANEL
ALT: 21 U/L (ref 0–35)
AST: 24 U/L (ref 0–37)
Albumin: 4.7 g/dL (ref 3.5–5.2)
Alkaline Phosphatase: 179 U/L — ABNORMAL HIGH (ref 39–117)
Bilirubin, Direct: 0.1 mg/dL (ref 0.0–0.3)
Total Bilirubin: 0.4 mg/dL (ref 0.2–1.2)
Total Protein: 8.5 g/dL — ABNORMAL HIGH (ref 6.0–8.3)

## 2019-12-28 NOTE — Progress Notes (Signed)
[name] 74 y.o. 1945-09-11 [id number]  Assessment & Plan:   Encounter Diagnoses  Name Primary?  . Abnormal LFTs Yes  . Abdominal pain, epigastric   . Gastroesophageal reflux disease, unspecified whether esophagitis present     Recheck liver test today.  Her clinical scenario is compatible with a common bile duct stone, perhaps she passed it.  Fortunately she feels well now.  Further plans pending these results.  We will refill her reflux medications and promethazine as needed.  Sometimes MAI and the treatment for it may cause abnormal LFTs but that should not cause abdominal pain.  Keep that in mind i.e. potential drug toxicity from ethambutol  I did encourage her to try to take the COVID-19 vaccine.  Given her comorbidities it seems like she is clearly at high risk of complications and death from Covid.  I appreciate the opportunity to care for this patient. CC: Ann Held, DO Laverna Peace NP   Subjective:   Chief Complaint: Reflux needs refills, recent abdominal pain abnormal LFTs  HPI The patient is a 74 year old white woman on oxygen with chronic MAI of the lungs and chronic iron deficiency anemia as well as GERD.  She is here for a routine follow-up in order to continue to obtain pantoprazole and promethazine refills.  As far as GERD that is under control.  I was messaged by Laverna Peace NP out of the hematology clinic last week because the patient had abnormal LFTs and had complained of abdominal pain.  She has a history of cholecystitis cholelithiasis and choledocholithiasis treated with cholecystectomy and ERCP in 2019.  Lab Results  Component Value Date   ALT 92 (H) 12/22/2019   AST 109 (H) 12/22/2019   ALKPHOS 224 (H) 12/22/2019   BILITOT 0.6 12/22/2019   After receiving that message I contacted the patient and ordered an ultrasound as below  US Abdomen Limited RUQ CLINICAL DATA:  Right upper quadrant pain  EXAM: ULTRASOUND  ABDOMEN LIMITED RIGHT UPPER QUADRANT  COMPARISON:  Abdominal ultrasound 06/20/2017  FINDINGS: Gallbladder:  Surgically absent.  Common bile duct:  Diameter: 0.9 cm, within normal limits given history of cholecystectomy.  Liver:  No focal lesion identified. Within normal limits in parenchymal echogenicity. Portal vein is patent on color Doppler imaging with normal direction of blood flow towards the liver.  Other: Mild intrahepatic biliary duct dilation.  IMPRESSION: 1. No definite acute findings to explain the patient's right upper quadrant pain.  2. There is mild intrahepatic biliary duct dilation which may be secondary to patient's cholecystectomy.  Electronically Signed   By: Audie Pinto M.D.   On: 12/24/2019 16:15   She had 1 day of pain a week ago in the epigastric and right upper quadrant area radiating into the back.  She could not say if it reminded her of her gallstone pain.  She has been fine since.  Allergies  Allergen Reactions  . Dabigatran Etexilate Mesylate Other (See Comments)    INTERNAL BLEEDING- Pradaxa  . Augmentin [Amoxicillin-Pot Clavulanate] Diarrhea and Nausea Only    Did it involve swelling of the face/tongue/throat, SOB, or low BP? No Did it involve sudden or severe rash/hives, skin peeling, or any reaction on the inside of your mouth or nose? No Did you need to seek medical attention at a hospital or doctor's office? No When did it last happen?02/2019 If all above answers are "NO", may proceed with cephalosporin use.   [name] [Pentazocine] Other (See Comments)  Hallucinations    Current Meds  Medication Sig  . acetaminophen (TYLENOL) 650 MG CR tablet Take 650 mg by mouth every 8 (eight) hours as needed for pain.  [name] albuterol (PROVENTIL HFA) 108 (90 Base) MCG/ACT inhaler Inhale 2 puffs into the lungs every 6 (six) hours as needed for wheezing or shortness of breath.  [name] amLODipine (NORVASC) 2.5 MG tablet Take 1 tablet (2.5 mg  total) by mouth at bedtime.  [name] atorvastatin (LIPITOR) 20 MG tablet TAKE 1 TABLET BY MOUTH ONCE DAILY AT  6PM (Patient taking differently: Take 20 mg by mouth every evening. )  . azithromycin (ZITHROMAX) 500 MG tablet Take 500 mg by mouth 3 (three) times a week.  . denosumab (PROLIA) 60 MG/ML SOSY injection Inject 60 mg into the skin every 6 (six) months.   [name] ELIQUIS 5 MG TABS tablet Take 1 tablet by mouth twice daily  . ethambutol (MYAMBUTOL) 100 MG tablet Take by mouth.  . ethambutol (MYAMBUTOL) 400 MG tablet Take by mouth.  . furosemide (LASIX) 40 MG tablet TAKE 1 TABLET BY MOUTH IN THE MORNING (Patient taking differently: Take 40 mg by mouth in the morning. )  . gabapentin (NEURONTIN) 600 MG tablet Take 1 tablet by mouth 4 times daily  . lidocaine-prilocaine (EMLA) cream Apply 1 application topically as needed. (Patient taking differently: Apply 1 application topically as needed (for port access- to numb). )  . nitrofurantoin (MACRODANTIN) 100 MG capsule Take 100 mg by mouth daily.  . nitroGLYCERIN (NITROSTAT) 0.4 MG SL tablet Place 1 tablet (0.4 mg total) under the tongue every 5 (five) minutes as needed. CHEST PAIN  . oxyCODONE-acetaminophen (PERCOCET) 10-325 MG tablet Take 1 tablet by mouth every 4 (four) hours as needed. PAIN  . OXYGEN Inhale 3 L/min into the lungs continuous.   . pantoprazole (PROTONIX) 40 MG tablet Take 1 tablet (40 mg total) by mouth 2 (two) times daily.  . rifabutin (MYCOBUTIN) 150 MG capsule SMARTSIG:2 Capsule(s) By Mouth 3 Times a Week  . tiZANidine (ZANAFLEX) 4 MG tablet Take 1 tablet by mouth three times daily as needed for muscle spasm  . triamcinolone ointment (KENALOG) 0.1 % Apply 1 application topically daily as needed (to affected areas- for psoriasis).   [name] umeclidinium bromide (INCRUSE ELLIPTA) 62.5 MCG/INH AEPB Inhale 1 puff into the lungs daily.  [name] zolpidem (AMBIEN) 5 MG tablet TAKE 1 TABLET BY MOUTH EVERY DAY AT BEDTIME AS NEEDED   Past Medical History:    Diagnosis Date  . Anemia   . Arthritis   . Atrial fibrillation ([place])   . CAD (coronary artery disease)    Mild disease per cath in 2011  . COPD (chronic obstructive pulmonary disease) ([place])    Home O2  . Diverticulitis   . Elbow fracture, left aug 2012  . Erythropoietin deficiency anemia 06/19/2018  . Essential hypertension   . Fibromyalgia   . Gastroparesis   . GERD (gastroesophageal reflux disease)   . H/O: GI bleed    from Pradaxa  . Hepatitis    Hx of in high school   . History of pneumonia    Recurrent  . Hx MRSA infection   . Hx of gastric ulcer   . Hx of stress fx 10/2009   Right hip   . Iron malabsorption 08/14/2017  . LBBB (left bundle branch block)   . Oxygen dependent    2 liters via nasal cannula at all times  . Pacemaker    CRT therapy; followed  by Dr. Caryl Comes  . Pleural effusion     s/p right thoracentesis 03/09  . Primary dilated cardiomyopathy (HCC)    EF 45 to 50% per echo in Jan 2012  . RLS (restless legs syndrome)   . Silent aspiration   . Small bowel obstruction ([place])   . Urinary incontinence   . Venous embolism and thrombosis of subclavian vein [facility])    After pacemaker insertion Oct 2011  . Vitamin B12 deficiency   . Wears glasses    Reading   Past Surgical History:  Procedure Laterality Date  . APPENDECTOMY    . BIV PACEMAKER GENERATOR CHANGEOUT N/A 06/19/2019   Procedure: BIV PACEMAKER GENERATOR CHANGEOUT;  Surgeon: Deboraha Sprang, MD;  Location: [place] [facility];  Service: Cardiovascular;  Laterality: N/A;  . BRONCHIAL WASHINGS  05/25/2019   Procedure: BRONCHIAL WASHINGS;  Surgeon: Chesley Mires, MD;  [facility];  Service: Endoscopy;;  . CARDIAC CATHETERIZATION  04/08/2006, 11/16/2009  . CHOLECYSTECTOMY N/A 06/27/2017   Procedure: OPEN CHOLECYSTECTOMY;  Surgeon: Ralene Ok, MD;  Location: [place];  Service: General;  Laterality: N/A;  . COLECTOMY     Intestinal resection (5times)  . COLECTOMY  02/04/2000   ex. lap., intra-abd.  subtotal colectomy with ileosigmoid colon anastomosies and lysis of adhesions  . CYSTOSCOPY  11/11/2008  . CYSTOSCOPY W/ RETROGRADES  11/11/2008   right  . CYSTOSCOPY WITH INJECTION  04/30/2006   transurethral collagen injection; incision vaginal stenosis  . ERCP N/A 06/23/2017   Procedure: ENDOSCOPIC RETROGRADE CHOLANGIOPANCREATOGRAPHY (ERCP);  Surgeon: Carol Ada, MD;  Location: [place];  Service: Endoscopy;  Laterality: N/A;  . ERCP N/A 06/24/2017   Procedure: ENDOSCOPIC RETROGRADE CHOLANGIOPANCREATOGRAPHY (ERCP);  Surgeon: [name], MD;  [facility];  Service: Endoscopy;  Laterality: N/A;  . ESOPHAGOGASTRODUODENOSCOPY N/A 02/02/2013   Procedure: ESOPHAGOGASTRODUODENOSCOPY (EGD);  Surgeon: Lafayette Dragon, MD;  Location: [facility];  Service: Endoscopy;  Laterality: N/A;  . EXPLORATORY LAPAROTOMY  04/27/2009   lysis of adhesions, gastrostomy tube  . GASTROCUTANEOUS FISTULA CLOSURE    . INTERSTIM IMPLANT PLACEMENT  05/28/2006 - stage I   06/05/2006 - stage II  . INTERSTIM IMPLANT PLACEMENT  02/05/2012   Procedure: [name] IMPLANT FIRST STAGE;  Surgeon: Reece Packer, MD;  [facility];  Service: Urology;  Laterality: N/A;  Replacement of Interstim Lead     . INTERSTIM IMPLANT REVISION  03/06/2011   Procedure: REVISION OF [name];  Surgeon: Reece Packer, MD;  [facility];  Service: Urology;  Laterality: N/A;  Replacement of Neurostimulator  . INTERSTIM IMPLANT REVISION  10/23/2007  . IR IMAGING GUIDED PORT INSERTION  10/14/2017  . PACEMAKER PLACEMENT  12/21/2009  . Peg removed     with complications  . PORT-A-CATH REMOVAL  07/27/2011   Procedure: REMOVAL PORT-A-CATH;  Surgeon: Rolm Bookbinder, MD;  Location: [place];  Service: General;  Laterality: N/A;  . PORTACATH PLACEMENT  12/16/2009  . SAVORY DILATION N/A 02/02/2013   Procedure: SAVORY DILATION;  Surgeon: Lafayette Dragon, MD;  [facility];  Service: Endoscopy;   Laterality: N/A;  . SMALL INTESTINE SURGERY  05/20/2001   ex. lap., resection of small bowel stricture; gastrostomy; insertion central line  . TEE WITHOUT CARDIOVERSION N/A 07/17/2019   Procedure: TRANSESOPHAGEAL ECHOCARDIOGRAM (TEE);  Surgeon: Donato Heinz, MD;  Location: [facility] ENDOSCOPY;  Service: Cardiovascular;  Laterality: N/A;  . TONSILLECTOMY  age 15  . TOTAL ABDOMINAL HYSTERECTOMY     complete  .  TOTAL HIP ARTHROPLASTY  08/03/2011   Procedure: TOTAL HIP ARTHROPLASTY ANTERIOR APPROACH;  Surgeon: Mcarthur Rossetti, MD;  [facility];  Service: Orthopedics;  Laterality: Right;  [name] VIDEO BRONCHOSCOPY N/A 05/25/2019   Procedure: VIDEO BRONCHOSCOPY WITHOUT FLUORO;  Surgeon: Chesley Mires, MD;  [facility];  Service: Endoscopy;  Laterality: N/A;   Social History   Social History Narrative   Occupation: Disabled   Married - second time - biologic daughter (in [place]) from first marriage - adopted 4 3 alive with second marriage   Alcohol Use - no   Illicit Drug Use - no   Patient is a former smoker, quit 1988 x12yrs, <1ppd.    Daily Caffeine Use   01/20/2015 - updated   family history includes Breast cancer in her maternal aunt; Diabetes in her paternal grandfather; Heart disease in her brother, father, mother, and paternal aunt; Stroke in her mother.   Review of Systems As above  Objective:   Physical Exam BP 140/80   Pulse 80   Ht 5' (1.524 m)   Wt 89 lb 5 oz (40.5 kg)   BMI 17.44 kg/m  Chronically ill petite white woman on oxygen Abdomen is soft nontender no organomegaly or mass

## 2019-12-28 NOTE — Patient Instructions (Signed)
Your provider has requested that you go to the basement level for lab work before leaving today. Press "B" on the elevator. The lab is located at the first door on the left as you exit the elevator.   When you need medicine refilled just call the pharmacy and they will send it to [place]. We will refill your meds for 2 years.   I appreciate the opportunity to care for you. Silvano Rusk, MD, [facility]

## 2019-12-31 ENCOUNTER — Ambulatory Visit: Payer: HMO

## 2019-12-31 ENCOUNTER — Ambulatory Visit (INDEPENDENT_AMBULATORY_CARE_PROVIDER_SITE_OTHER): Payer: HMO

## 2019-12-31 ENCOUNTER — Other Ambulatory Visit: Payer: Self-pay

## 2019-12-31 DIAGNOSIS — Z23 Encounter for immunization: Secondary | ICD-10-CM

## 2020-01-04 NOTE — Progress Notes (Signed)
Remote pacemaker transmission.   

## 2020-01-11 ENCOUNTER — Other Ambulatory Visit: Payer: Self-pay | Admitting: Family Medicine

## 2020-01-11 DIAGNOSIS — R52 Pain, unspecified: Secondary | ICD-10-CM

## 2020-01-11 DIAGNOSIS — J449 Chronic obstructive pulmonary disease, unspecified: Secondary | ICD-10-CM | POA: Diagnosis not present

## 2020-01-11 DIAGNOSIS — I4891 Unspecified atrial fibrillation: Secondary | ICD-10-CM | POA: Diagnosis not present

## 2020-01-11 MED ORDER — OXYCODONE-ACETAMINOPHEN 10-325 MG PO TABS: 1.0000 | ORAL_TABLET | ORAL | 0 refills | Status: DC | PRN

## 2020-01-11 NOTE — Telephone Encounter (Signed)
Medication: oxyCODONE-acetaminophen (PERCOCET) 10-325 MG tablet [[id number]]    Has the patient contacted their pharmacy? No. (If no, request that the patient contact the pharmacy for the refill.) (If yes, when and what did the pharmacy advise?)  Preferred Pharmacy (with phone number or street name): [facility] [address] [place], [place] [address]  Phone:  [phone] Fax:  [phone]   Agent: Please be advised that RX refills may take up to 3 business days. We ask that you follow-up with your pharmacy.

## 2020-01-11 NOTE — Telephone Encounter (Signed)
Requesting: Percocet Contract: 11/06/2018   UDS: 11/06/18 Last OV: 08/07/19 Next OV: N/A Last Refill: 12/14/19, #120--0 RF Database:   Please advise

## 2020-01-12 ENCOUNTER — Other Ambulatory Visit: Payer: Self-pay

## 2020-01-12 ENCOUNTER — Inpatient Hospital Stay: Payer: HMO

## 2020-01-12 VITALS — BP 151/82 | HR 78 | Temp 97.9°F | Resp 18

## 2020-01-12 DIAGNOSIS — D631 Anemia in chronic kidney disease: Secondary | ICD-10-CM

## 2020-01-12 DIAGNOSIS — N189 Chronic kidney disease, unspecified: Secondary | ICD-10-CM | POA: Diagnosis not present

## 2020-01-12 DIAGNOSIS — Z95828 Presence of other vascular implants and grafts: Secondary | ICD-10-CM

## 2020-01-12 LAB — CMP (CANCER CENTER ONLY)
ALT: 35 U/L (ref 0–44)
AST: 42 U/L — ABNORMAL HIGH (ref 15–41)
Albumin: 3.9 g/dL (ref 3.5–5.0)
Alkaline Phosphatase: 171 U/L — ABNORMAL HIGH (ref 38–126)
Anion gap: 7 (ref 5–15)
BUN: 25 mg/dL — ABNORMAL HIGH (ref 8–23)
CO2: 29 mmol/L (ref 22–32)
Calcium: 8.4 mg/dL — ABNORMAL LOW (ref 8.9–10.3)
Chloride: 104 mmol/L (ref 98–111)
Creatinine: 0.93 mg/dL (ref 0.44–1.00)
GFR, Estimated: >60 mL/min (ref >60)
Glucose, Bld: 92 mg/dL (ref 70–99)
Potassium: 4.2 mmol/L (ref 3.5–5.1)
Sodium: 140 mmol/L (ref 135–145)
Total Bilirubin: 0.3 mg/dL (ref 0.3–1.2)
Total Protein: 7.2 g/dL (ref 6.5–8.1)

## 2020-01-12 LAB — CBC WITH DIFFERENTIAL (CANCER CENTER ONLY)
Abs Immature Granulocytes: 0.01 10*3/uL (ref 0.00–0.07)
Basophils Absolute: 0 10*3/uL (ref 0.0–0.1)
Basophils Relative: 1 %
Eosinophils Absolute: 0.1 10*3/uL (ref 0.0–0.5)
Eosinophils Relative: 3 %
HCT: 34.6 % — ABNORMAL LOW (ref 36.0–46.0)
Hemoglobin: 11 g/dL — ABNORMAL LOW (ref 12.0–15.0)
Immature Granulocytes: 0 %
Lymphocytes Relative: 18 %
Lymphs Abs: 0.5 10*3/uL — ABNORMAL LOW (ref 0.7–4.0)
MCH: 29.3 pg (ref 26.0–34.0)
MCHC: 31.8 g/dL (ref 30.0–36.0)
MCV: 92 fL (ref 80.0–100.0)
Monocytes Absolute: 0.5 10*3/uL (ref 0.1–1.0)
Monocytes Relative: 17 %
Neutro Abs: 1.7 10*3/uL (ref 1.7–7.7)
Neutrophils Relative %: 61 %
Platelet Count: 186 10*3/uL (ref 150–400)
RBC: 3.76 MIL/uL — ABNORMAL LOW (ref 3.87–5.11)
RDW: 14.4 % (ref 11.5–15.5)
WBC Count: 2.8 10*3/uL — ABNORMAL LOW (ref 4.0–10.5)
nRBC: 0 % (ref 0.0–0.2)

## 2020-01-12 MED ORDER — SODIUM CHLORIDE 0.9% FLUSH
10.0000 mL | INTRAVENOUS | Status: DC | PRN
  Administered 2020-01-12: 10 mL via INTRAVENOUS
  Filled 2020-01-12: qty 10

## 2020-01-12 MED ORDER — HEPARIN SOD (PORK) LOCK FLUSH 100 UNIT/ML IV SOLN
500.0000 [IU] | Freq: Once | INTRAVENOUS | Status: AC
  Administered 2020-01-12: 500 [IU] via INTRAVENOUS
  Filled 2020-01-12: qty 5

## 2020-01-12 NOTE — Progress Notes (Signed)
Pt. Does not need the shot, Hgb is 11.0. Discharged stable and asymptomatic.

## 2020-01-15 ENCOUNTER — Other Ambulatory Visit: Payer: Self-pay | Admitting: Family Medicine

## 2020-01-15 DIAGNOSIS — G4700 Insomnia, unspecified: Secondary | ICD-10-CM

## 2020-01-15 NOTE — Telephone Encounter (Signed)
Requesting: Ambien 5mg  Contract: 11/06/2018 UDS: None recently seen Last Visit: 08/07/2019 Next Visit: None scheduled Last Refill: 12/18/2019 #30 and RF Pt sig: 1 tab qhs prn  Please Advise

## 2020-01-27 DIAGNOSIS — R350 Frequency of micturition: Secondary | ICD-10-CM | POA: Diagnosis not present

## 2020-01-27 DIAGNOSIS — N3941 Urge incontinence: Secondary | ICD-10-CM | POA: Diagnosis not present

## 2020-01-28 ENCOUNTER — Telehealth: Payer: Self-pay | Admitting: Pharmacist

## 2020-01-28 NOTE — Progress Notes (Addendum)
Chronic Care Management Pharmacy Assistant   Name: [name]  MRN: [id number] DOB: September 11, 1945  Reason for Encounter: Medication Review  PCP : Ann Held, DO  Allergies:   Allergies  Allergen Reactions  . Dabigatran Etexilate Mesylate Other (See Comments)    INTERNAL BLEEDING- Pradaxa  . Augmentin [Amoxicillin-Pot Clavulanate] Diarrhea and Nausea Only    Did it involve swelling of the face/tongue/throat, SOB, or low BP? No Did it involve sudden or severe rash/hives, skin peeling, or any reaction on the inside of your mouth or nose? No Did you need to seek medical attention at a hospital or doctor's office? No When did it last happen?02/2019 If all above answers are "NO", may proceed with cephalosporin use.   [name] [Pentazocine] Other (See Comments)    Hallucinations     Medications: Outpatient Encounter Medications as of 01/28/2020  Medication Sig Note  . acetaminophen (TYLENOL) 650 MG CR tablet Take 650 mg by mouth every 8 (eight) hours as needed for pain.   [name] albuterol (PROVENTIL HFA) 108 (90 Base) MCG/ACT inhaler Inhale 2 puffs into the lungs every 6 (six) hours as needed for wheezing or shortness of breath. 07/15/2019: Gets from the [facility]  . amLODipine (NORVASC) 2.5 MG tablet Take 1 tablet (2.5 mg total) by mouth at bedtime.   [name] atorvastatin (LIPITOR) 20 MG tablet TAKE 1 TABLET BY MOUTH ONCE DAILY AT  6PM (Patient taking differently: Take 20 mg by mouth every evening. )   . azithromycin (ZITHROMAX) 500 MG tablet Take 500 mg by mouth 3 (three) times a week.   . denosumab (PROLIA) 60 MG/ML SOSY injection Inject 60 mg into the skin every 6 (six) months.  07/15/2019: Not yet re-started, per the patient  . ELIQUIS 5 MG TABS tablet Take 1 tablet by mouth twice daily   . ethambutol (MYAMBUTOL) 100 MG tablet Take by mouth.   . ethambutol (MYAMBUTOL) 400 MG tablet Take by mouth.   . furosemide (LASIX) 40 MG tablet TAKE 1 TABLET BY  MOUTH IN THE MORNING (Patient taking differently: Take 40 mg by mouth in the morning. )   . gabapentin (NEURONTIN) 600 MG tablet Take 1 tablet by mouth 4 times daily   . lidocaine-prilocaine (EMLA) cream Apply 1 application topically as needed. (Patient taking differently: Apply 1 application topically as needed (for port access- to numb). )   . nitrofurantoin (MACRODANTIN) 100 MG capsule Take 100 mg by mouth daily.   . nitroGLYCERIN (NITROSTAT) 0.4 MG SL tablet Place 1 tablet (0.4 mg total) under the tongue every 5 (five) minutes as needed. CHEST PAIN   . oxyCODONE-acetaminophen (PERCOCET) 10-325 MG tablet Take 1 tablet by mouth every 4 (four) hours as needed. PAIN   . OXYGEN Inhale 3 L/min into the lungs continuous.    . pantoprazole (PROTONIX) 40 MG tablet Take 1 tablet (40 mg total) by mouth 2 (two) times daily.   . rifabutin (MYCOBUTIN) 150 MG capsule SMARTSIG:2 Capsule(s) By Mouth 3 Times a Week   . tiZANidine (ZANAFLEX) 4 MG tablet Take 1 tablet by mouth three times daily as needed for muscle spasm   . triamcinolone ointment (KENALOG) 0.1 % Apply 1 application topically daily as needed (to affected areas- for psoriasis).    [name] umeclidinium bromide (INCRUSE ELLIPTA) 62.5 MCG/INH AEPB Inhale 1 puff into the lungs daily. 05/04/2019: Gets through [place] [facility]  . zolpidem (AMBIEN) 5 MG tablet TAKE 1 TABLET BY MOUTH EVERY DAY AT  BEDTIME AS NEEDED   . [DISCONTINUED] iron dextran complex (INFED) 50 MG/ML injection Please give infed infusion (no test dose needed) over 4 hours per pharmacy calculated dose. Ht: 5'2 Wt: 99 pounds Hgb: 12    No facility-administered encounter medications on file as of 01/28/2020.    Current Diagnosis: Patient Active Problem List   Diagnosis Date Noted  . Hematoma 08/07/2019  . Other neutropenia ([place])   . Post-procedural fever 07/20/2019  . Acute febrile illness 07/16/2019  . Pulmonary MAI (mycobacterium avium-intracellulare) infection ([place]) 06/24/2019  . History of  total right hip replacement 05/27/2019  . NICM (nonischemic cardiomyopathy) ([place]) 05/19/2019  . Educated about COVID-19 virus infection 09/07/2018  . Erythropoietin deficiency anemia 06/19/2018  . Swelling of calf 02/04/2018  . Bad odor of urine 12/09/2017  . Iron deficiency anemia due to chronic blood loss 08/14/2017  . Iron malabsorption 08/14/2017  . Hyperlipidemia LDL goal <100 07/08/2017  . Elevated ALT measurement   . Hypotension 06/20/2017  . HLD (hyperlipidemia) 06/20/2017  . CAD (coronary artery disease) 06/20/2017  . Protein-calorie malnutrition, severe ([place]) 06/20/2017  . Generalized weakness   . Weakness 05/30/2017  . Focal neurological deficit 05/30/2017  . Chronic diastolic CHF (congestive heart failure) ([place]) 05/30/2017  . TIA (transient ischemic attack) 05/30/2017  . AKI (acute kidney injury) ([place]) 05/30/2017  . Abnormal LFTs 05/30/2017  . Bronchiectasis ([place]) 04/22/2017  . Right otitis media 07/17/2016  . Cerumen impaction 07/17/2016  . Osteoporosis 03/16/2016  . Headache 11/20/2014  . Skin infection 05/27/2014  . Insomnia 05/27/2014  . Abdominal wall pain 07/09/2013  . Other dysphagia 02/02/2013  . Platelets decreased ([place]) 01/29/2012  . Vitamin D deficiency 12/13/2011  . Hypokalemia 12/13/2011  . Hypocalcemia 12/12/2011  . Adhesions of intestine, very dense, with frozen abdomen 12/11/2011  . SBO (small bowel obstruction), recurrent 12/11/2011  . Protein-calorie malnutrition, moderate ([place]) 12/11/2011  . Oxygen dependent   . Hx of gastric ulcer   . UTI (urinary tract infection) 10/15/2011  . Allergic conjunctivitis 10/15/2011  . Degenerative arthritis of hip 08/03/2011  . B12 deficiency 07/02/2011  . Bilateral leg weakness 01/16/2011  . Anemia, chronic disease 09/14/2010  . Recurrent pneumonia 08/11/2010  . CRT- [employer] 06/26/2010  . DYSURIA 05/19/2010  . Chronic hypoxemic respiratory failure ([place]) 02/21/2010  . DEHYDRATION 01/06/2010  .  GASTROENTERITIS 01/06/2010  . Congestive dilated cardiomyopathy ([place]) 11/17/2009  . LBBB 11/17/2009  . SYSTOLIC HEART FAILURE, CHRONIC 11/17/2009  . GERD (gastroesophageal reflux disease) 02/17/2009  . NONSPECIFIC ABN FINDNG RAD&OTH EXAM BILARY TRCT 09/03/2008  . COPD with emphysema ([place]) 06/28/2008  . DIVERTICULOSIS, COLON 06/03/2008  . GASTRITIS 06/02/2008  . Atrial fibrillation/flutter 12/08/2007  . GASTROPARESIS 08/07/2007  . NAUSEA, CHRONIC 08/07/2007  . Abdominal pain 08/07/2007  . SYNCOPE 02/17/2007  . RESTLESS LEG SYNDROME 08/01/2006  . Chronic pain syndrome 08/01/2006  . OSTEOARTHRITIS 07/31/2006  . Degenerative disc disease, lumbar 07/31/2006  . Fibromyalgia 07/31/2006  . CHRONIC FATIGUE SYNDROME 07/31/2006  . URINARY INCONTINENCE 07/31/2006  . ANEMIA-IRON DEFICIENCY 05/01/2006  . Essential hypertension 05/01/2006    Goals Addressed   None    Reviewing adherence to Atorvastatin 20 mg Patient reports Atorvastatin was discontinued by Dr. Caryl Comes or Dr. Percival Spanish.  Follow-Up:  Pharmacist Review   [place] Shannon, [place] [facility] [place] Pharmacist Assistant [phone]  Will have to assess this further at visit on 02/03/20. Reviewed by: [place], PharmD Clinical Pharmacist [facility] at [facility] [phone]

## 2020-02-01 ENCOUNTER — Other Ambulatory Visit: Payer: Self-pay | Admitting: Family Medicine

## 2020-02-01 ENCOUNTER — Other Ambulatory Visit: Payer: Self-pay | Admitting: Internal Medicine

## 2020-02-01 DIAGNOSIS — R52 Pain, unspecified: Secondary | ICD-10-CM

## 2020-02-02 ENCOUNTER — Inpatient Hospital Stay: Payer: HMO

## 2020-02-02 ENCOUNTER — Inpatient Hospital Stay: Payer: HMO | Admitting: Family

## 2020-02-02 ENCOUNTER — Telehealth: Payer: Self-pay

## 2020-02-02 NOTE — Telephone Encounter (Signed)
Returned pts call as she fell this morning and has hurt her back/ribs, req to r/s, lm with new appt for 02/08/20, will send a message to the desk nurse regarding fall... AOM

## 2020-02-03 ENCOUNTER — Ambulatory Visit: Payer: HMO | Admitting: Orthopaedic Surgery

## 2020-02-03 ENCOUNTER — Encounter: Payer: Self-pay | Admitting: Orthopaedic Surgery

## 2020-02-03 ENCOUNTER — Telehealth: Payer: Self-pay | Admitting: Pulmonary Disease

## 2020-02-03 ENCOUNTER — Ambulatory Visit: Payer: HMO | Admitting: Pharmacist

## 2020-02-03 ENCOUNTER — Ambulatory Visit (INDEPENDENT_AMBULATORY_CARE_PROVIDER_SITE_OTHER): Payer: HMO

## 2020-02-03 DIAGNOSIS — J9611 Chronic respiratory failure with hypoxia: Secondary | ICD-10-CM

## 2020-02-03 DIAGNOSIS — I480 Paroxysmal atrial fibrillation: Secondary | ICD-10-CM

## 2020-02-03 DIAGNOSIS — S2232XA Fracture of one rib, left side, initial encounter for closed fracture: Secondary | ICD-10-CM | POA: Diagnosis not present

## 2020-02-03 DIAGNOSIS — R0781 Pleurodynia: Secondary | ICD-10-CM

## 2020-02-03 DIAGNOSIS — I10 Essential (primary) hypertension: Secondary | ICD-10-CM

## 2020-02-03 MED ORDER — INCRUSE ELLIPTA 62.5 MCG/INH IN AEPB: 1.0000 | INHALATION_SPRAY | Freq: Every day | RESPIRATORY_TRACT | 12 refills | Status: DC

## 2020-02-03 NOTE — Patient Instructions (Addendum)
Visit Information  Goals Addressed            This Visit's Progress   . Chronic Care Management Pharmacy Care Plan       CARE PLAN ENTRY (see longitudinal plan of care for additional care plan information)  Current Barriers:  . Chronic Disease Management support, education, and care coordination needs related to Afib, COPD, HF, HTN, HLD, CAD, Chronic Pain Syndrome, GERD, Insomnia   Hypertension BP Readings from Last 3 Encounters:  01/12/20 (!) 151/82  12/28/19 140/80  12/22/19 (!) 143/77   . Pharmacist Clinical Goal(s): o Over the next 90 days, patient will work with PharmD and providers to maintain BP goal <140/90 . Current regimen:  o Amlodipine 2.5mg  daily PM . Patient self care activities - Over the next 90 days, patient will: o Maintain hypertension medication regimen.   Hyperlipidemia Lab Results  Component Value Date/Time   LDLCALC 54 05/11/2019 10:45 AM   . Pharmacist Clinical Goal(s): o Over the next 90 days, patient will work with PharmD and providers to maintain LDL goal < 70 . Current regimen:  o Atorvastatin 20mg  daily . Patient self care activities - Over the next 90 days, patient will: o Maintain cholesterol medication regimen.   COPD . Pharmacist Clinical Goal(s) o Over the next 90 days, patient will work with PharmD and providers to reduce symptoms associated with COPD and reduce barrier to receiving medication . Current regimen:   Incruse ellipta 62.5mg /inh once daily (per [employer])  Proventil (per [facility]) . Interventions: o Complete paperwork for Incruse Ellipta patient assistance . Patient self care activities - Over the next 90 days, patient will: o Provide out of pocket pharmacy expense report to include with Incruse Ellipta application  Osteoporosis  . Pharmacist Clinical Goal(s) o Over the next 90 days, patient will work with PharmD and providers to reduce risk of fracture due to osteoporosis  . Current regimen:   o None . Interventions: o Discussed options of Prolia, but will defer until patient has completed MAI treatment . Patient self care activities - Over the next 180 days, patient will: o Practice fall prevention strategies  Health Maintenance  . Pharmacist Clinical Goal(s) o Over the next year, patient will work with PharmD and providers to complete health maintenance screenings/vaccinations . Interventions: o Discussed consideration of completing Shingrix vaccine series (would wait until completion of MAI abx treatment) o Discussed consideration of completing COVID Vaccine Series . Patient self care activities - Over the next year, patient will: o Consider completion of Shingrix vaccine series (after completion of MAI treatment) o Consider completion of COVID Vaccine series   Medication management . Pharmacist Clinical Goal(s): o Over the next 90 days, patient will work with PharmD and providers to maintain optimal medication adherence . Current pharmacy: [facility]  . Interventions o Comprehensive medication review performed. o Utilize [facility] pharmacy for medication synchronization, packaging and delivery . Patient self care activities - Over the next 90 days, patient will: o Focus on medication adherence by filling and taking medications appropriately  o Take medications as prescribed o Report any questions or concerns to PharmD and/or provider(s)  Please see past updates related to this goal by clicking on the "Past Updates" button in the selected goal        The patient verbalized understanding of instructions, educational materials, and care plan.  Telephone follow up appointment with pharmacy team member scheduled for:  05/10/2020 [name], PharmD Clinical Pharmacist [facility] [phone]

## 2020-02-03 NOTE — Telephone Encounter (Signed)
Spoke with Melvenia Beam. She is needing a hard copy of the pt's Incruse prescription faxed to her for patient assistance forms. This has been faxed to [phone]. Nothing further was needed.

## 2020-02-03 NOTE — Chronic Care Management (AMB) (Signed)
Chronic Care Management Pharmacy  Name: [name]  MRN: [id number] DOB: 04/01/1945   Chief Complaint/ HPI  [name],  74 y.o. , female presents for their Follow-Up CCM visit with the clinical pharmacist via telephone visit.  PCP : Ann Held, DO  Their chronic conditions include: Afib, COPD, HF, HTN, HLD, CAD, Chronic Pain Syndrome, GERD, Insomnia  Office Visits: None since last CCM visit on 11/02/19.   Consult Visit: 02/03/20: Ortho visit w/ Dr. Ninfa Linden - Reviewed Xrays, pt does have rib fracture. Follow up as needed.   12/28/19: Gastro visit w/ Dr. Carlean Purl - Recheck liver test. Clinical scenario compatible with a common bile duct stone. Pt well at visit. No med changes noted.   11/26/19: ID visit w/ Dr. Baxter Flattery - Continue current regimen. RTC 6-8weeks MAI infection abx regimen:  - rifabutin 2 x 150 mg capsules on Tuesdays, Thursdays, and Saturdays - ethambutol 2 x 400 mg tablets + 2 x 100 mg tablets on Mondays, Wednesdays, Fridays - azithromycin 1 x 500 mg tablet on Mondays, Wednesdays, Fridays Resume MAI treatment  Medications: Outpatient Encounter Medications as of 02/03/2020  Medication Sig Note  . acetaminophen (TYLENOL) 650 MG CR tablet Take 650 mg by mouth every 8 (eight) hours as needed for pain.   [name] albuterol (PROVENTIL HFA) 108 (90 Base) MCG/ACT inhaler Inhale 2 puffs into the lungs every 6 (six) hours as needed for wheezing or shortness of breath. 07/15/2019: Gets from the [facility]  . amLODipine (NORVASC) 2.5 MG tablet Take 1 tablet (2.5 mg total) by mouth at bedtime.   [name] atorvastatin (LIPITOR) 20 MG tablet TAKE 1 TABLET BY MOUTH ONCE DAILY AT  6PM (Patient taking differently: Take 20 mg by mouth every evening. )   . azithromycin (ZITHROMAX) 500 MG tablet Take 500 mg by mouth 3 (three) times a week.   . denosumab (PROLIA) 60 MG/ML SOSY injection Inject 60 mg into the skin every 6 (six) months.  07/15/2019: Not yet  re-started, per the patient  . ELIQUIS 5 MG TABS tablet Take 1 tablet by mouth twice daily   . ethambutol (MYAMBUTOL) 100 MG tablet Take by mouth.   . ethambutol (MYAMBUTOL) 400 MG tablet Take by mouth.   . furosemide (LASIX) 40 MG tablet TAKE 1 TABLET BY MOUTH IN THE MORNING (Patient taking differently: Take 40 mg by mouth in the morning. )   . gabapentin (NEURONTIN) 600 MG tablet Take 1 tablet by mouth 4 times daily   . lidocaine-prilocaine (EMLA) cream Apply 1 application topically as needed. (Patient taking differently: Apply 1 application topically as needed (for port access- to numb). )   . nitrofurantoin (MACRODANTIN) 100 MG capsule Take 100 mg by mouth daily.   . nitroGLYCERIN (NITROSTAT) 0.4 MG SL tablet Place 1 tablet (0.4 mg total) under the tongue every 5 (five) minutes as needed. CHEST PAIN   . oxyCODONE-acetaminophen (PERCOCET) 10-325 MG tablet Take 1 tablet by mouth every 4 (four) hours as needed. PAIN   . OXYGEN Inhale 3 L/min into the lungs continuous.    . pantoprazole (PROTONIX) 40 MG tablet Take 1 tablet by mouth twice daily   . rifabutin (MYCOBUTIN) 150 MG capsule SMARTSIG:2 Capsule(s) By Mouth 3 Times a Week   . tiZANidine (ZANAFLEX) 4 MG tablet Take 1 tablet (4 mg total) by mouth 3 (three) times daily as needed for muscle spasms.   [name] triamcinolone ointment (KENALOG) 0.1 % Apply 1 application topically daily as  needed (to affected areas- for psoriasis).    [name] zolpidem (AMBIEN) 5 MG tablet TAKE 1 TABLET BY MOUTH EVERY Theophil Thivierge AT BEDTIME AS NEEDED   . [DISCONTINUED] iron dextran complex (INFED) 50 MG/ML injection Please give infed infusion (no test dose needed) over 4 hours per pharmacy calculated dose. Ht: 5'2 Wt: 99 pounds Hgb: 12   . [DISCONTINUED] umeclidinium bromide (INCRUSE ELLIPTA) 62.5 MCG/INH AEPB Inhale 1 puff into the lungs daily. 05/04/2019: Gets through [place] PAP   No facility-administered encounter medications on file as of 02/03/2020.   Immunization History    Administered Date(s) Administered  . DTaP 11/03/1996, 01/04/1997, 05/03/1997, 10/19/1997, 08/05/2001  . Fluad Quad(high Dose 65+) 11/06/2018, 12/31/2019  . HPV Quadrivalent 09/05/2009, 09/13/2010, 09/26/2011  . Hepatitis A, Ped/Adol-2 Dose 09/21/2004, 03/29/2005  . Hepatitis B, ped/adol 04/30/1996, 11/03/1996, 10/19/1997  . HiB (PRP-T) 11/03/1996, 01/04/1997, 05/03/1997, 10/19/1997  . IPV 11/03/1996, 01/04/1997, 05/03/1997, 08/05/2001  . Influenza Split 01/16/2011, 12/12/2011  . Influenza Whole 01/15/2007, 12/08/2007, 12/28/2008, 12/17/2009  . Influenza, High Dose Seasonal PF 11/28/2015, 12/07/2016, 11/15/2017  . Influenza, Seasonal, Injecte, Preservative Fre 12/09/2012  . Influenza,Quad,Nasal, Live 03/17/2007, 03/06/2009, 01/08/2010, 01/30/2011, 02/09/2012, 01/18/2013  . Influenza,inj,Quad PF,6+ Mos 03/29/2005, 04/30/2005, 03/01/2006, 01/28/2008, 12/01/2013, 01/16/2014, 12/22/2014, 12/28/2014  . MMR 05/03/1997, 08/05/2001  . Meningococcal B, OMV 12/22/2014  . Pneumococcal Conjugate-13 06/10/2014, 11/15/2014  . Pneumococcal Polysaccharide-23 12/17/2009  . Tdap 09/17/2007, 05/15/2014, 12/09/2018  . Varicella 11/16/1997, 05/18/2008  . Zoster 12/31/2013   SDOH Screenings   Alcohol Screen:   . Last Alcohol Screening Score (AUDIT): Not on file  Depression (PHQ2-9): Low Risk   . PHQ-2 Score: 0  Financial Resource Strain: Medium Risk  . Difficulty of Paying Living Expenses: Somewhat hard  Food Insecurity: No Food Insecurity  . Worried About Charity fundraiser in the Last Year: Never true  . Ran Out of Food in the Last Year: Never true  Housing:   . Last Housing Risk Score: Not on file  Physical Activity:   . Days of Exercise per Week: Not on file  . Minutes of Exercise per Session: Not on file  Social Connections:   . Frequency of Communication with Friends and Family: Not on file  . Frequency of Social Gatherings with Friends and Family: Not on file  . Attends Religious  Services: Not on file  . Active Member of Clubs or Organizations: Not on file  . Attends Archivist Meetings: Not on file  . Marital Status: Not on file  Stress:   . Feeling of Stress : Not on file  Tobacco Use: Medium Risk  . Smoking Tobacco Use: Former Smoker  . Smokeless Tobacco Use: Never Used  Transportation Needs: No Transportation Needs  . Lack of Transportation (Medical): No  . Lack of Transportation (Non-Medical): No     Current Diagnosis/Assessment:  Goals Addressed            This Visit's Progress   . Chronic Care Management Pharmacy Care Plan       CARE PLAN ENTRY (see longitudinal plan of care for additional care plan information)  Current Barriers:  . Chronic Disease Management support, education, and care coordination needs related to Afib, COPD, HF, HTN, HLD, CAD, Chronic Pain Syndrome, GERD, Insomnia   Hypertension BP Readings from Last 3 Encounters:  01/12/20 (!) 151/82  12/28/19 140/80  12/22/19 (!) 143/77   . Pharmacist Clinical Goal(s): o Over the next 90 days, patient will work with PharmD and providers to maintain BP goal <  140/90 . Current regimen:  o Amlodipine 2.22m daily PM . Patient self care activities - Over the next 90 days, patient will: o Maintain hypertension medication regimen.   Hyperlipidemia Lab Results  Component Value Date/Time   LDLCALC 54 05/11/2019 10:45 AM   . Pharmacist Clinical Goal(s): o Over the next 90 days, patient will work with PharmD and providers to maintain LDL goal < 70 . Current regimen:  o Atorvastatin 249mdaily . Patient self care activities - Over the next 90 days, patient will: o Maintain cholesterol medication regimen.   COPD . Pharmacist Clinical Goal(s) o Over the next 90 days, patient will work with PharmD and providers to reduce symptoms associated with COPD and reduce barrier to receiving medication . Current regimen:   Incruse ellipta 62.96m56mnh once daily (per Sood)  Proventil  (per [place] Interventions: o Complete paperwork for Incruse Ellipta patient assistance . Patient self care activities - Over the next 90 days, patient will: o Provide out of pocket pharmacy expense report to include with Incruse Ellipta application  Osteoporosis  . Pharmacist Clinical Goal(s) o Over the next 90 days, patient will work with PharmD and providers to reduce risk of fracture due to osteoporosis  . Current regimen:  o None . Interventions: o Discussed options of Prolia, but will defer until patient has completed MAI treatment . Patient self care activities - Over the next 180 days, patient will: o Practice fall prevention strategies  Health Maintenance  . Pharmacist Clinical Goal(s) o Over the next year, patient will work with PharmD and providers to complete health maintenance screenings/vaccinations . Interventions: o Discussed consideration of completing Shingrix vaccine series (would wait until completion of MAI abx treatment) o Discussed consideration of completing COVID Vaccine Series . Patient self care activities - Over the next year, patient will: o Consider completion of Shingrix vaccine series (after completion of MAI treatment) o Consider completion of COVID Vaccine series   Medication management . Pharmacist Clinical Goal(s): o Over the next 90 days, patient will work with PharmD and providers to maintain optimal medication adherence . [facility] pharmacy[facility] Interventions o Comprehensive medication review performed. o Utilize [facility] pharmacy for medication synchronization, packaging and delivery . Patient self care activities - Over the next 90 days, patient will: o Focus on medication adherence by filling and taking medications appropriately  o Take medications as prescribed o Report any questions or concerns to PharmD and/or provider(s)  Please see past updates related to this goal by clicking on the "Past Updates" button in  the selected goal       Social Hx Husband. 5 children (4 adopted (1 deceased), 1 biological) 2 god-children. 3 grandchildren and oldest Daughter [place] Gets her hair done every Friday.   PAP Meds = ~ 1 month supply remaining  Morning meds in ziploc bag PM meds in ziploc bag PRN meds on kitchen counter Takes morning medicine between 7-8am Takes night medicine around 9-10pm   COPD / MAI / Tobacco   Last spirometry score:  10/10/2011-pulmonary function test-FVC 1.47 (54% predicted), postbronchodilator ratio 64, postbronchodilator FEV1 1.15 (59% predicted), positive bronchodilator response, DLCO 5.3 (35% predicted)  Gold Grade: Gold 2 (FEV1 50-79%) Current COPD Classification:  B (high sx, <2 exacerbations/yr)  Eosinophil count:   Lab Results  Component Value Date/Time   EOSPCT 3 01/12/2020 11:30 AM   EOSPCT 1.6 04/01/2009 10:48 AM   EOSPCT 0.8 05/01/2007  08:45 AM  %                               Eos (Absolute):  Lab Results  Component Value Date/Time   EOSABS 0.1 01/12/2020 11:30 AM   EOSABS 0.1 04/01/2009 10:48 AM    Tobacco Status:  Social History   Tobacco Use  Smoking Status Former Smoker  . Packs/Coner Gibbard: 1.00  . Years: 20.00  . Pack years: 20.00  . Quit date: 03/19/1986  . Years since quitting: 33.9  Smokeless Tobacco Never Used    Patient has failed these meds in past: None noted (Spiriva changed to [employer]) Patient is currently controlled on the following medications:   Incruse ellipta 62.79m/inh once daily (per Sood)  Proventil (per [place] Using maintenance inhaler regularly? Yes Frequency of rescue inhaler use:  daily after supper  Pt reports that her lung CT showed bacteria in lungs. Has televisit with Dr. SHalford Chessmanto come up with plan  Update 07/01/19  Will take antibiotics for 1.5 years to 2 years for MAI Has pill box for regimen outlined Met with pharmacist in ID office   Update 07/30/19 Was  supposed to go to ID appt yesterday, but did not due to the weather. She was told to stop the MAI abx since her white count was trending down. Rescheduled to go back June 15 for ID follow up. States she is waiting until this appt to resume abx therapy. She is only taking the abx for her UTI (cephalexin 5056m  Update 11/02/19 States the abx are affecting her bowels, but she takes immodium and this helps. May need coordination with Incruse Ellipta and Proventil PAP.   Called Incruse Ellipta PAP ([place]- Patient needs new PAP application. Will coordinate this for patient.  Called Proventil PAP ([employer]) - Eligible until 02/2020 and patient has 12 refills remaining with one available to be filled now. Requested refill for patient. She should receive it in 7-10 business days (8/25 - 8/30)  Update 02/03/20 Ever receive Incruse Ellipta? No,needs a refill Has received Proventil. Called Dr. SoJuanetta Goslingffice to request prescription to be faxed to our office to include in patient's Incruse Ellipta PAP application.   Plan -Continue current medications  -Complete [employer] PAP application Needs  Script from Dr. SoJuanetta Goslingffice  Pharmacy Out of Pocket expense of $600    Hypertension   BP Goal <140/90  Office blood pressures are  BP Readings from Last 3 Encounters:  01/12/20 (!) 151/82  12/28/19 140/80  12/22/19 (!) 143/77    Patient has failed these meds in the past: None noted  Patient is currently controlled on the following medications:   Amlodipine 2.53m77maily PM  Patient checks BP at home infrequently   Denies dizziness, lightheadedness  Update 07/01/19 Carvedilol: Currently taking 1 tab at night, no longer taking the half tab in the morning Has not been checking BP at home Wants to reduce BP medications Dr. HocPercival Spanishgeneral [facility]  Update 07/30/19 She is not checking home BP due to all that is going on with her health. Understanding of this and encouraged patient to contact  me if there is anything I can do to assist.   Update 11/02/19 She was D/C on carvedilol due to low BP by Dr. KleCaryl Comestate she still has coldness at her fingertips, but her BP has been running on the low side without the carvedilol. "116/70 something...never over  120 for top or over 80 range for bottom"  Update 02/03/20 Feels her BP is doing great.  BP was elevated at Dr. Antonieta Pert visit due to having BP taken after walking.  Plan -Continue current medications   Pain    Patient has failed these meds in past: None noted  Patient is currently controlled on the following medications:   Gabapentin 658m 4 times daily  Oxycodone 10-3257m4 times daily  Tizanidine 25m62mID (only takes BID)  Acetaminophen 325 2 tabs PRN - uses every other Priyanka Causey  Patient is concerned with there being confusion with the oxycodone scripts sent in from her ED visits vs the usual supply she receives from Dr. LowEtter Sjogrenhe [facility] does not plan to pick up those prescriptions.  Encouraged pt to ask [facility] to cancel her current oxy scripts to not interfere with prescribing from Dr. LowEtter Sjogrenpdate 11/02/19 Did get her oxy scripts resolved.  Update 02/03/20 Recently revealed that she has a broken rib.  Denied pain meds from ortho due to getting regimen from Dr. LowEtter Sjogren Plan -Continue current medications   AFIB   Patient is currently controlled via pacemaker.  Patient has failed these meds in past: amiodarone, carvedilol  Patient is currently controlled on the following medications:   Eliquis 5mg109mice daily (per Dr. LownEtter Sjogreno you have enough Eliquis through PAP??  She is on her last bottle of Eliquis. She has about 20 tabs remaining.  Will work to coordinate Eliquis PAP. Patient states she is willing to purchase at pharmacy if PAP medication not received in time.   [place] Eliquis [place]AP ([place] patient's application was no longer active  as of December 2020. Will fill out new PAP application to get patient free supply of Eliquis if approved.   Update 02/03/20 Called [employer] and patient was approved for Eliquis 12/15/19 - 03/18/20.  Pt will have to reapply for year 2022 Encouraged patient to make sure she refills medication before the application has to be reapplied.   Plan -Continue current medications  Miscellaneous Meds Triamcinolone ointment - rarely uses (for psoriasis)    KaneDe BlancharmD Clinical Pharmacist [facility]-[phone]

## 2020-02-03 NOTE — Progress Notes (Signed)
Office Visit Note   Patient: [name]           Date of Birth: May 10, 1945           MRN: [id number] Visit Date: 02/03/2020              Requested by: [address], [place], [place] [address] [place],  [place] [address] PCP: Carollee Herter, Alferd Apa, DO   Assessment & Plan: Visit Diagnoses:  1. Rib pain   2. Closed fracture of one rib of left side, initial encounter     Plan: I did go over the x-rays with her.  She does have a rib fracture.  She knows to really take it easy over the next several weeks and she is performing activities.  If she does get any worsening of her breathing or shortness of breath she needs a let us know immediately and go to the emergency room.  Follow-up for this can be as needed.  All questions and concerns were answered and addressed.  Follow-Up Instructions: Return if symptoms worsen or fail to improve.   Orders:  Orders Placed This Encounter  Procedures  . XR Ribs Unilateral Left   No orders of the defined types were placed in this encounter.     Procedures: No procedures performed   Clinical Data: No additional findings.   Subjective: Chief Complaint  Patient presents with  . Chest - Pain  The patient is very well-known to me.  She is a frail 74 years old who is on home oxygen.  She sustained a mechanical fall about a week ago in her bathroom injuring her left side.  She says is been painful to take a very deep breath and is more of a gnawing pain when she first goes to lay down and gets up but overall she is doing well other than this pain.  HPI  Review of Systems She currently denies any headache.  She denies any sternal chest pain.  She does report left-sided rib pain.  She denies any fever, chills, nausea, vomiting  Objective: Vital Signs: There were no vitals taken for this visit.  Physical Exam She is alert and orient x3 and in no acute distress Ortho Exam examination of her left side shows pain to palpation  over the mid ribs. Specialty Comments:  No specialty comments available.  Imaging: XR Ribs Unilateral Left  Result Date: 02/03/2020 2 views of the ribs on the left side does show a nondisplaced rib fracture of one of the mid ribs to the left.  The lung markings go all the way to the periphery and there is no evidence of pneumothorax.    PMFS History: Patient Active Problem List   Diagnosis Date Noted  . Hematoma 08/07/2019  . Other neutropenia ([place])   . Post-procedural fever 07/20/2019  . Acute febrile illness 07/16/2019  . Pulmonary MAI (mycobacterium avium-intracellulare) infection ([place]) 06/24/2019  . History of total right hip replacement 05/27/2019  . NICM (nonischemic cardiomyopathy) ([place]) 05/19/2019  . Educated about COVID-19 virus infection 09/07/2018  . Erythropoietin deficiency anemia 06/19/2018  . Swelling of calf 02/04/2018  . Bad odor of urine 12/09/2017  . Iron deficiency anemia due to chronic blood loss 08/14/2017  . Iron malabsorption 08/14/2017  . Hyperlipidemia LDL goal <100 07/08/2017  . Elevated ALT measurement   . Hypotension 06/20/2017  . HLD (hyperlipidemia) 06/20/2017  . CAD (coronary artery disease) 06/20/2017  . Protein-calorie malnutrition, severe ([place])  06/20/2017  . Generalized weakness   . Weakness 05/30/2017  . Focal neurological deficit 05/30/2017  . Chronic diastolic CHF (congestive heart failure) ([place]) 05/30/2017  . TIA (transient ischemic attack) 05/30/2017  . AKI (acute kidney injury) ([place]) 05/30/2017  . Abnormal LFTs 05/30/2017  . Bronchiectasis ([place]) 04/22/2017  . Right otitis media 07/17/2016  . Cerumen impaction 07/17/2016  . Osteoporosis 03/16/2016  . Headache 11/20/2014  . Skin infection 05/27/2014  . Insomnia 05/27/2014  . Abdominal wall pain 07/09/2013  . Other dysphagia 02/02/2013  . Platelets decreased ([place]) 01/29/2012  . Vitamin D deficiency 12/13/2011  . Hypokalemia 12/13/2011  . Hypocalcemia 12/12/2011  . Adhesions  of intestine, very dense, with frozen abdomen 12/11/2011  . SBO (small bowel obstruction), recurrent 12/11/2011  . Protein-calorie malnutrition, moderate ([place]) 12/11/2011  . Oxygen dependent   . Hx of gastric ulcer   . UTI (urinary tract infection) 10/15/2011  . Allergic conjunctivitis 10/15/2011  . Degenerative arthritis of hip 08/03/2011  . B12 deficiency 07/02/2011  . Bilateral leg weakness 01/16/2011  . Anemia, chronic disease 09/14/2010  . Recurrent pneumonia 08/11/2010  . CRT- [employer] 06/26/2010  . DYSURIA 05/19/2010  . Chronic hypoxemic respiratory failure ([place]) 02/21/2010  . DEHYDRATION 01/06/2010  . GASTROENTERITIS 01/06/2010  . Congestive dilated cardiomyopathy ([place]) 11/17/2009  . LBBB 11/17/2009  . SYSTOLIC HEART FAILURE, CHRONIC 11/17/2009  . GERD (gastroesophageal reflux disease) 02/17/2009  . NONSPECIFIC ABN FINDNG RAD&OTH EXAM BILARY TRCT 09/03/2008  . COPD with emphysema ([place]) 06/28/2008  . DIVERTICULOSIS, COLON 06/03/2008  . GASTRITIS 06/02/2008  . Atrial fibrillation/flutter 12/08/2007  . GASTROPARESIS 08/07/2007  . NAUSEA, CHRONIC 08/07/2007  . Abdominal pain 08/07/2007  . SYNCOPE 02/17/2007  . RESTLESS LEG SYNDROME 08/01/2006  . Chronic pain syndrome 08/01/2006  . OSTEOARTHRITIS 07/31/2006  . Degenerative disc disease, lumbar 07/31/2006  . Fibromyalgia 07/31/2006  . CHRONIC FATIGUE SYNDROME 07/31/2006  . URINARY INCONTINENCE 07/31/2006  . ANEMIA-IRON DEFICIENCY 05/01/2006  . Essential hypertension 05/01/2006   Past Medical History:  Diagnosis Date  . Anemia   . Arthritis   . Atrial fibrillation ([place])   . CAD (coronary artery disease)    Mild disease per cath in 2011  . COPD (chronic obstructive pulmonary disease) ([place])    Home O2  . Diverticulitis   . Elbow fracture, left aug 2012  . Erythropoietin deficiency anemia 06/19/2018  . Essential hypertension   . Fibromyalgia   . Gastroparesis   . GERD (gastroesophageal reflux disease)   . H/O: GI  bleed    from Pradaxa  . Hepatitis    Hx of in high school   . History of pneumonia    Recurrent  . Hx MRSA infection   . Hx of gastric ulcer   . Hx of stress fx 10/2009   Right hip   . Iron malabsorption 08/14/2017  . LBBB (left bundle branch block)   . Oxygen dependent    2 liters via nasal cannula at all times  . Pacemaker    CRT therapy; followed by Dr. Caryl Comes  . Pleural effusion     s/p right thoracentesis 03/09  . Primary dilated cardiomyopathy (H[facility])    EF 45 to 50% per echo in Jan 2012  . RLS (restless legs syndrome)   . Silent aspiration   . Small bowel obstruction ([place])   . Urinary incontinence   . Venous embolism and thrombosis of subclavian vein [facility])    After pacemaker insertion Oct 2011  . Vitamin B12 deficiency   .  Wears glasses    Reading    Family History  Problem Relation Age of Onset  . Heart disease Father   . Stroke Mother   . Heart disease Mother   . Heart disease Brother   . Breast cancer Maternal Aunt   . Heart disease Paternal Aunt   . Diabetes Paternal Grandfather   . Colon cancer Neg Hx     Past Surgical History:  Procedure Laterality Date  . APPENDECTOMY    . BIV PACEMAKER GENERATOR CHANGEOUT N/A 06/19/2019   Procedure: BIV PACEMAKER GENERATOR CHANGEOUT;  Surgeon: Deboraha Sprang, MD;  Location: [facility];  Service: Cardiovascular;  Laterality: N/A;  . BRONCHIAL WASHINGS  05/25/2019   Procedure: BRONCHIAL WASHINGS;  Surgeon: Chesley Mires, MD;  Location: [facility];  Service: Endoscopy;;  . CARDIAC CATHETERIZATION  04/08/2006, 11/16/2009  . CHOLECYSTECTOMY N/A 06/27/2017   Procedure: OPEN CHOLECYSTECTOMY;  Surgeon: Ralene Ok, MD;  Location: [facility];  Service: General;  Laterality: N/A;  . COLECTOMY     Intestinal resection (5times)  . COLECTOMY  02/04/2000   ex. lap., intra-abd. subtotal colectomy with ileosigmoid colon anastomosies and lysis of adhesions  . CYSTOSCOPY  11/11/2008  . CYSTOSCOPY W/ RETROGRADES  11/11/2008   right    . CYSTOSCOPY WITH INJECTION  04/30/2006   transurethral collagen injection; incision vaginal stenosis  . ERCP N/A 06/23/2017   Procedure: ENDOSCOPIC RETROGRADE CHOLANGIOPANCREATOGRAPHY (ERCP);  Surgeon: Carol Ada, MD;  Location: [facility];  Service: Endoscopy;  Laterality: N/A;  . ERCP N/A 06/24/2017   Procedure: ENDOSCOPIC RETROGRADE CHOLANGIOPANCREATOGRAPHY (ERCP);  Surgeon: [name], MD;  Location: [facility] ENDOSCOPY;  Service: Endoscopy;  Laterality: N/A;  . ESOPHAGOGASTRODUODENOSCOPY N/A 02/02/2013   Procedure: ESOPHAGOGASTRODUODENOSCOPY (EGD);  Surgeon: Lafayette Dragon, MD;  Location: [facility];  Service: Endoscopy;  Laterality: N/A;  . EXPLORATORY LAPAROTOMY  04/27/2009   lysis of adhesions, gastrostomy tube  . GASTROCUTANEOUS FISTULA CLOSURE    . INTERSTIM IMPLANT PLACEMENT  05/28/2006 - stage I   06/05/2006 - stage II  . INTERSTIM IMPLANT PLACEMENT  02/05/2012   Procedure: [name] IMPLANT FIRST STAGE;  Surgeon: Reece Packer, MD;  [facility];  Service: Urology;  Laterality: N/A;  Replacement of Interstim Lead     . INTERSTIM IMPLANT REVISION  03/06/2011   Procedure: REVISION OF [name];  Surgeon: Reece Packer, MD;  [facility];  Service: Urology;  Laterality: N/A;  Replacement of Neurostimulator  . INTERSTIM IMPLANT REVISION  10/23/2007  . IR IMAGING GUIDED PORT INSERTION  10/14/2017  . PACEMAKER PLACEMENT  12/21/2009  . Peg removed     with complications  . PORT-A-CATH REMOVAL  07/27/2011   Procedure: REMOVAL PORT-A-CATH;  Surgeon: Rolm Bookbinder, MD;  Location: [facility];  Service: General;  Laterality: N/A;  . PORTACATH PLACEMENT  12/16/2009  . SAVORY DILATION N/A 02/02/2013   Procedure: SAVORY DILATION;  Surgeon: Lafayette Dragon, MD;  [facility];  Service: Endoscopy;  Laterality: N/A;  . SMALL INTESTINE SURGERY  05/20/2001   ex. lap., resection of small bowel stricture; gastrostomy; insertion central line  . TEE WITHOUT  CARDIOVERSION N/A 07/17/2019   Procedure: TRANSESOPHAGEAL ECHOCARDIOGRAM (TEE);  Surgeon: Donato Heinz, MD;  Location[facility] ENDOSCOPY;  Service: Cardiovascular;  Laterality: N/A;  . TONSILLECTOMY  age 18  . TOTAL ABDOMINAL HYSTERECTOMY     complete  . TOTAL HIP ARTHROPLASTY  08/03/2011   Procedure: TOTAL HIP ARTHROPLASTY ANTERIOR APPROACH;  Surgeon: Mcarthur Rossetti, MD;  [facility];  Service: Orthopedics;  Laterality: Right;  [name] VIDEO BRONCHOSCOPY N/A 05/25/2019   Procedure: VIDEO BRONCHOSCOPY WITHOUT FLUORO;  Surgeon: Chesley Mires, MD;  [facility];  Service: Endoscopy;  Laterality: N/A;   Social History   Occupational History  . Occupation: disabled    Employer: RETIRED  Tobacco Use  . Smoking status: Former Smoker    Packs/day: 1.00    Years: 20.00    Pack years: 20.00    Quit date: 03/19/1986    Years since quitting: 33.9  . Smokeless tobacco: Never Used  Vaping Use  . Vaping Use: Never used  Substance and Sexual Activity  . Alcohol use: No    Alcohol/week: 0.0 standard drinks  . Drug use: No  . Sexual activity: Yes

## 2020-02-05 ENCOUNTER — Other Ambulatory Visit: Payer: Self-pay | Admitting: Family Medicine

## 2020-02-05 DIAGNOSIS — R52 Pain, unspecified: Secondary | ICD-10-CM

## 2020-02-05 MED ORDER — OXYCODONE-ACETAMINOPHEN 10-325 MG PO TABS: 1.0000 | ORAL_TABLET | ORAL | 0 refills | Status: DC | PRN

## 2020-02-05 NOTE — Telephone Encounter (Signed)
Requesting:Oxycodone Contract:11/06/2018 MEB:RAXE Last Visit:08/07/2019 Next Visit:none scheduled Last Refill:01/11/2020  Please Advise

## 2020-02-05 NOTE — Telephone Encounter (Signed)
Medication:oxyCODONE-acetaminophen (PERCOCET) 10-325 MG tablet [[id number]]    Has the patient contacted their pharmacy? no (If no, request that the patient contact the pharmacy for the refill.) (If yes, when and what did the pharmacy advise?)  Preferred Pharmacy (with phone number or street name): [facility], [address], [place] [address]  Phone:  [phone] Fax:  [phone]  DEA #:  --  Agent: Please be advised that RX refills may take up to 3 business days. We ask that you follow-up with your pharmacy.

## 2020-02-05 NOTE — Chronic Care Management (AMB) (Signed)
Pt's husband returned out of pocket expense for Incruse Ellipta PAP application.  Received prescription from Dr. Juanetta Gosling office for Iantha Fallen Faxed patient's patient assistance application for Incruse Ellipta today 02/05/20 at 1:13pm.  Will have pharmacy team to follow up on status of approval.  De Blanch, PharmD Clinical Pharmacist [facility] [phone]

## 2020-02-08 ENCOUNTER — Other Ambulatory Visit: Payer: Self-pay

## 2020-02-08 ENCOUNTER — Inpatient Hospital Stay: Payer: HMO

## 2020-02-08 ENCOUNTER — Encounter: Payer: Self-pay | Admitting: Family

## 2020-02-08 ENCOUNTER — Inpatient Hospital Stay (HOSPITAL_BASED_OUTPATIENT_CLINIC_OR_DEPARTMENT_OTHER): Payer: HMO | Admitting: Family

## 2020-02-08 ENCOUNTER — Inpatient Hospital Stay: Payer: HMO | Attending: Family

## 2020-02-08 VITALS — BP 147/78 | HR 86 | Temp 98.0°F | Resp 17 | Ht 60.0 in | Wt 89.0 lb

## 2020-02-08 DIAGNOSIS — N189 Chronic kidney disease, unspecified: Secondary | ICD-10-CM | POA: Insufficient documentation

## 2020-02-08 DIAGNOSIS — D500 Iron deficiency anemia secondary to blood loss (chronic): Secondary | ICD-10-CM

## 2020-02-08 DIAGNOSIS — D631 Anemia in chronic kidney disease: Secondary | ICD-10-CM | POA: Diagnosis not present

## 2020-02-08 LAB — CBC WITH DIFFERENTIAL (CANCER CENTER ONLY)
Abs Immature Granulocytes: 0.02 10*3/uL (ref 0.00–0.07)
Basophils Absolute: 0.1 10*3/uL (ref 0.0–0.1)
Basophils Relative: 1 %
Eosinophils Absolute: 0.2 10*3/uL (ref 0.0–0.5)
Eosinophils Relative: 4 %
HCT: 32.3 % — ABNORMAL LOW (ref 36.0–46.0)
Hemoglobin: 10.4 g/dL — ABNORMAL LOW (ref 12.0–15.0)
Immature Granulocytes: 0 %
Lymphocytes Relative: 10 %
Lymphs Abs: 0.6 10*3/uL — ABNORMAL LOW (ref 0.7–4.0)
MCH: 29.5 pg (ref 26.0–34.0)
MCHC: 32.2 g/dL (ref 30.0–36.0)
MCV: 91.8 fL (ref 80.0–100.0)
Monocytes Absolute: 0.6 10*3/uL (ref 0.1–1.0)
Monocytes Relative: 10 %
Neutro Abs: 4.4 10*3/uL (ref 1.7–7.7)
Neutrophils Relative %: 75 %
Platelet Count: 230 10*3/uL (ref 150–400)
RBC: 3.52 MIL/uL — ABNORMAL LOW (ref 3.87–5.11)
RDW: 13.9 % (ref 11.5–15.5)
WBC Count: 5.9 10*3/uL (ref 4.0–10.5)
nRBC: 0 % (ref 0.0–0.2)

## 2020-02-08 LAB — CMP (CANCER CENTER ONLY)
ALT: 10 U/L (ref 0–44)
AST: 20 U/L (ref 15–41)
Albumin: 4.1 g/dL (ref 3.5–5.0)
Alkaline Phosphatase: 133 U/L — ABNORMAL HIGH (ref 38–126)
Anion gap: 7 (ref 5–15)
BUN: 20 mg/dL (ref 8–23)
CO2: 30 mmol/L (ref 22–32)
Calcium: 8.6 mg/dL — ABNORMAL LOW (ref 8.9–10.3)
Chloride: 101 mmol/L (ref 98–111)
Creatinine: 0.86 mg/dL (ref 0.44–1.00)
GFR, Estimated: >60 mL/min (ref >60)
Glucose, Bld: 98 mg/dL (ref 70–99)
Potassium: 4.1 mmol/L (ref 3.5–5.1)
Sodium: 138 mmol/L (ref 135–145)
Total Bilirubin: 0.3 mg/dL (ref 0.3–1.2)
Total Protein: 7.4 g/dL (ref 6.5–8.1)

## 2020-02-08 LAB — RETICULOCYTES
Immature Retic Fract: 4.8 % (ref 2.3–15.9)
RBC.: 3.5 MIL/uL — ABNORMAL LOW (ref 3.87–5.11)
Retic Count, Absolute: 27 10*3/uL (ref 19.0–186.0)
Retic Ct Pct: 0.8 % (ref 0.4–3.1)

## 2020-02-08 MED ORDER — EPOETIN ALFA-EPBX 40000 UNIT/ML IJ SOLN
INTRAMUSCULAR | Status: AC
  Filled 2020-02-08: qty 1

## 2020-02-08 MED ORDER — EPOETIN ALFA-EPBX 40000 UNIT/ML IJ SOLN
40000.0000 [IU] | Freq: Once | INTRAMUSCULAR | Status: AC
  Administered 2020-02-08: 40000 [IU] via SUBCUTANEOUS

## 2020-02-08 MED ORDER — SODIUM CHLORIDE 0.9% FLUSH
10.0000 mL | INTRAVENOUS | Status: DC | PRN
  Administered 2020-02-08: 10 mL
  Filled 2020-02-08: qty 10

## 2020-02-08 MED ORDER — HEPARIN SOD (PORK) LOCK FLUSH 100 UNIT/ML IV SOLN
500.0000 [IU] | Freq: Once | INTRAVENOUS | Status: AC | PRN
  Administered 2020-02-08: 500 [IU]
  Filled 2020-02-08: qty 5

## 2020-02-08 NOTE — Progress Notes (Signed)
Pt discharged in no apparent distress. Pt left ambulatory without assistance. Pt aware of discharge instructions and verbalized understanding and had no further questions.  

## 2020-02-08 NOTE — Patient Instructions (Signed)

## 2020-02-08 NOTE — Progress Notes (Signed)
Hematology and Oncology Follow Up Visit  [name] [id number] 11-06-1945 74 y.o. 02/08/2020   Principle Diagnosis:  Iron deficiency anemia Erythropoietin deficient anemia  Current Therapy: IV iron as indicated Retacrit 40,000 units SQfor hgb <11   Interim History:  [name] is here today for follow-up. She recently fell (while putting lotion on her legs in the bathroom) and fractured rib on the left side. She is recuperating slowly and resting as needed.  She denies syncope.  SOB is stable on supplemental O2 24 hours a day.  She has not noticed any blood loss. No bruising or petechiae.  No fever, chills, n/v, cough, rash, dizziness, chest pain, palpitations, abdominal pain or changes in bowel or bladder habits.  No swelling or tenderness in her extremities.  The neuropathy in her fingertips is unchanged.  She has maintained a good appetite and is staying well hydrated. Her weight is stable.   ECOG Performance Status: 1 - Symptomatic but completely ambulatory  Medications:  Allergies as of 02/08/2020      Reactions   Dabigatran Etexilate Mesylate Other (See Comments)   INTERNAL BLEEDING- Pradaxa   Augmentin [amoxicillin-pot Clavulanate] Diarrhea, Nausea Only   Did it involve swelling of the face/tongue/throat, SOB, or low BP? No Did it involve sudden or severe rash/hives, skin peeling, or any reaction on the inside of your mouth or nose? No Did you need to seek medical attention at a hospital or doctor's office? No When did it last happen?02/2019 If all above answers are "NO", may proceed with cephalosporin use.   [place] [pentazocine] Other (See Comments)   Hallucinations      Medication List       Accurate as of February 08, 2020  3:23 PM. If you have any questions, ask your nurse or doctor.        acetaminophen 650 MG CR tablet Commonly known as: TYLENOL Take 650 mg by mouth every 8 (eight) hours as needed for pain.   amLODipine 2.5 MG  tablet Commonly known as: NORVASC Take 1 tablet (2.5 mg total) by mouth at bedtime.   atorvastatin 20 MG tablet Commonly known as: LIPITOR TAKE 1 TABLET BY MOUTH ONCE DAILY AT  6PM What changed:   how much to take  how to take this  when to take this  additional instructions   azithromycin 500 MG tablet Commonly known as: ZITHROMAX Take 500 mg by mouth 3 (three) times a week.   denosumab 60 MG/ML Sosy injection Commonly known as: PROLIA Inject 60 mg into the skin every 6 (six) months.   Eliquis 5 MG Tabs tablet Generic drug: apixaban Take 1 tablet by mouth twice daily   ethambutol 100 MG tablet Commonly known as: MYAMBUTOL Take by mouth.   ethambutol 400 MG tablet Commonly known as: MYAMBUTOL Take by mouth.   furosemide 40 MG tablet Commonly known as: LASIX TAKE 1 TABLET BY MOUTH IN THE MORNING What changed: when to take this   gabapentin 600 MG tablet Commonly known as: NEURONTIN Take 1 tablet by mouth 4 times daily   Incruse Ellipta 62.5 MCG/INH Aepb Generic drug: umeclidinium bromide Inhale 1 puff into the lungs daily.   lidocaine-prilocaine cream Commonly known as: EMLA Apply 1 application topically as needed. What changed: reasons to take this   nitrofurantoin 100 MG capsule Commonly known as: MACRODANTIN Take 100 mg by mouth daily.   nitroGLYCERIN 0.4 MG SL tablet Commonly known as: NITROSTAT Place 1 tablet (0.4 mg total) under the tongue  every 5 (five) minutes as needed. CHEST PAIN   oxyCODONE-acetaminophen 10-325 MG tablet Commonly known as: PERCOCET Take 1 tablet by mouth every 4 (four) hours as needed. PAIN   OXYGEN Inhale 3 L/min into the lungs continuous.   pantoprazole 40 MG tablet Commonly known as: PROTONIX Take 1 tablet by mouth twice daily   Proventil HFA 108 (90 Base) MCG/ACT inhaler Generic drug: albuterol Inhale 2 puffs into the lungs every 6 (six) hours as needed for wheezing or shortness of breath.   rifabutin 150 MG  capsule Commonly known as: MYCOBUTIN SMARTSIG:2 Capsule(s) By Mouth 3 Times a Week   tiZANidine 4 MG tablet Commonly known as: ZANAFLEX Take 1 tablet (4 mg total) by mouth 3 (three) times daily as needed for muscle spasms.   triamcinolone ointment 0.1 % Commonly known as: KENALOG Apply 1 application topically daily as needed (to affected areas- for psoriasis).   zolpidem 5 MG tablet Commonly known as: AMBIEN TAKE 1 TABLET BY MOUTH EVERY DAY AT BEDTIME AS NEEDED       Allergies:  Allergies  Allergen Reactions  . Dabigatran Etexilate Mesylate Other (See Comments)    INTERNAL BLEEDING- Pradaxa  . Augmentin [Amoxicillin-Pot Clavulanate] Diarrhea and Nausea Only    Did it involve swelling of the face/tongue/throat, SOB, or low BP? No Did it involve sudden or severe rash/hives, skin peeling, or any reaction on the inside of your mouth or nose? No Did you need to seek medical attention at a hospital or doctor's office? No When did it last happen?02/2019 If all above answers are "NO", may proceed with cephalosporin use.   [name] [Pentazocine] Other (See Comments)    Hallucinations     Past Medical History, Surgical history, Social history, and Family History were reviewed and updated.  Review of Systems: All other 10 point review of systems is negative.   Physical Exam:  height is 5' (1.524 m) and weight is 89 lb (40.4 kg). Her oral temperature is 98 F (36.7 C). Her blood pressure is 147/78 (abnormal) and her pulse is 86. Her respiration is 17 and oxygen saturation is 100%.   Wt Readings from Last 3 Encounters:  02/08/20 89 lb (40.4 kg)  12/28/19 89 lb 5 oz (40.5 kg)  12/22/19 87 lb (39.5 kg)    Ocular: Sclerae unicteric, pupils equal, round and reactive to light Ear-nose-throat: Oropharynx clear, dentition fair Lymphatic: No cervical or supraclavicular adenopathy Lungs no rales or rhonchi, good excursion bilaterally Heart regular rate and rhythm, no murmur  appreciated Abd soft, nontender, positive bowel sounds, no liver or spleen tip palpated on exam, no fluid wave  MSK no focal spinal tenderness, no joint edema Neuro: non-focal, well-oriented, appropriate affect Breasts: Deferred   Lab Results  Component Value Date   WBC 2.8 (L) 01/12/2020   HGB 11.0 (L) 01/12/2020   HCT 34.6 (L) 01/12/2020   MCV 92.0 01/12/2020   PLT 186 01/12/2020   Lab Results  Component Value Date   FERRITIN 1,026 (H) 12/22/2019   IRON 107 12/22/2019   TIBC 189 (L) 12/22/2019   UIBC 81 (L) 12/22/2019   IRONPCTSAT 57 12/22/2019   Lab Results  Component Value Date   RETICCTPCT 0.7 12/22/2019   RBC 3.76 (L) 01/12/2020   RETICCTABS 64.6 04/01/2009   No results found for: KPAFRELGTCHN, LAMBDASER, KAPLAMBRATIO No results found for: IGGSERUM, IGA, IGMSERUM No results found for: TOTALPROTELP, ALBUMINELP, A1GS, A2GS, BETS, BETA2SER, [place], [place], SPEI   Chemistry  Component Value Date/Time   NA 140 01/12/2020 1130   NA 135 06/16/2019 1333   K 4.2 01/12/2020 1130   CL 104 01/12/2020 1130   CO2 29 01/12/2020 1130   BUN 25 (H) 01/12/2020 1130   BUN 31 (H) 06/16/2019 1333   CREATININE 0.93 01/12/2020 1130   CREATININE 0.91 03/16/2016 1539      Component Value Date/Time   CALCIUM 8.4 (L) 01/12/2020 1130   ALKPHOS 171 (H) 01/12/2020 1130   AST 42 (H) 01/12/2020 1130   ALT 35 01/12/2020 1130   BILITOT 0.3 01/12/2020 1130       Impression and Plan: Ms. Quirarte is a very pleasant 74yo caucasian female with iron deficiency anemia secondary to malabsorption and possible GI blood loss with Eliquis. Retacrit given today for Hgb 10.4.  Iron studies are pending. We will replace if needed.  Lab/injection in 3 weeks and follow-up in 6 weeks.  She can contact our office with any questions or concerns.   Laverna Peace, NP 11/22/20213:23 PM

## 2020-02-08 NOTE — Patient Instructions (Signed)

## 2020-02-09 ENCOUNTER — Encounter: Payer: Self-pay | Admitting: Internal Medicine

## 2020-02-09 ENCOUNTER — Telehealth: Payer: Self-pay | Admitting: Hematology & Oncology

## 2020-02-09 ENCOUNTER — Ambulatory Visit: Payer: HMO | Admitting: Internal Medicine

## 2020-02-09 VITALS — BP 150/79 | HR 84 | Wt 90.6 lb

## 2020-02-09 DIAGNOSIS — I10 Essential (primary) hypertension: Secondary | ICD-10-CM

## 2020-02-09 DIAGNOSIS — Z79899 Other long term (current) drug therapy: Secondary | ICD-10-CM | POA: Diagnosis not present

## 2020-02-09 DIAGNOSIS — J479 Bronchiectasis, uncomplicated: Secondary | ICD-10-CM

## 2020-02-09 DIAGNOSIS — A310 Pulmonary mycobacterial infection: Secondary | ICD-10-CM | POA: Diagnosis not present

## 2020-02-09 LAB — FERRITIN: Ferritin: 818 ng/mL — ABNORMAL HIGH (ref 11–307)

## 2020-02-09 LAB — IRON AND TIBC
Iron: 68 ug/dL (ref 41–142)
Saturation Ratios: 28 % (ref 21–57)
TIBC: 240 ug/dL (ref 236–444)
UIBC: 172 ug/dL (ref 120–384)

## 2020-02-09 NOTE — Progress Notes (Signed)
RFV: follow up for pulmonary MAC  Patient ID: [name], female   DOB: Apr 02, 1945, 74 y.o.   MRN: [id number]  HPI [name] is a 74yo F with bronchiectasis with pulmonary MAC currently on TIW 3 drug regimen. She has noticed a Decrease in productive cough. And frequency is also decreased. Coughs mostly in the morning  Also takes macrobid 179m daily through her urologist for UTI proph.  Since we last saw her she has Had GLF where she fell on left rib cage for rib fracture, which is improving. In addition, roughly 1 month ago had elevated LFTs-- which was thought to be due Gallstones, despite hx of cholecystectomy. Has improvement in abdominal discomfort. LFTS normalized now.  No change in ehr vision. Tolerates her medications.  No change in weight. -- likes resource drink but hasn't taken it recently- a year ago the last time.   Outpatient Encounter Medications as of 02/09/2020  Medication Sig  . acetaminophen (TYLENOL) 650 MG CR tablet Take 650 mg by mouth every 8 (eight) hours as needed for pain.  .Marland Kitchenalbuterol (PROVENTIL HFA) 108 (90 Base) MCG/ACT inhaler Inhale 2 puffs into the lungs every 6 (six) hours as needed for wheezing or shortness of breath.  .Marland KitchenamLODipine (NORVASC) 2.5 MG tablet Take 1 tablet (2.5 mg total) by mouth at bedtime.  .Marland Kitchenatorvastatin (LIPITOR) 20 MG tablet TAKE 1 TABLET BY MOUTH ONCE DAILY AT  6PM (Patient taking differently: Take 20 mg by mouth every evening. )  . azithromycin (ZITHROMAX) 500 MG tablet Take 500 mg by mouth 3 (three) times a week.  .Marland KitchenELIQUIS 5 MG TABS tablet Take 1 tablet by mouth twice daily  . ethambutol (MYAMBUTOL) 100 MG tablet Take by mouth.  . furosemide (LASIX) 40 MG tablet TAKE 1 TABLET BY MOUTH IN THE MORNING (Patient taking differently: Take 40 mg by mouth in the morning. )  . gabapentin (NEURONTIN) 600 MG tablet Take 1 tablet by mouth 4 times daily  . nitrofurantoin (MACRODANTIN) 100 MG capsule Take 100 mg by mouth daily.  .  nitroGLYCERIN (NITROSTAT) 0.4 MG SL tablet Place 1 tablet (0.4 mg total) under the tongue every 5 (five) minutes as needed. CHEST PAIN  . oxyCODONE-acetaminophen (PERCOCET) 10-325 MG tablet Take 1 tablet by mouth every 4 (four) hours as needed. PAIN  . OXYGEN Inhale 3 L/min into the lungs continuous.   . pantoprazole (PROTONIX) 40 MG tablet Take 1 tablet by mouth twice daily  . rifabutin (MYCOBUTIN) 150 MG capsule SMARTSIG:2 Capsule(s) By Mouth 3 Times a Week  . tiZANidine (ZANAFLEX) 4 MG tablet Take 1 tablet (4 mg total) by mouth 3 (three) times daily as needed for muscle spasms.  .Marland Kitchentriamcinolone ointment (KENALOG) 0.1 % Apply 1 application topically daily as needed (to affected areas- for psoriasis).   .Marland Kitchenzolpidem (AMBIEN) 5 MG tablet TAKE 1 TABLET BY MOUTH EVERY DAY AT BEDTIME AS NEEDED  . denosumab (PROLIA) 60 MG/ML SOSY injection Inject 60 mg into the skin every 6 (six) months.  (Patient not taking: Reported on 02/09/2020)  . ethambutol (MYAMBUTOL) 400 MG tablet Take by mouth.  . lidocaine-prilocaine (EMLA) cream Apply 1 application topically as needed. (Patient taking differently: Apply 1 application topically as needed (for port access- to numb). )  . umeclidinium bromide (INCRUSE ELLIPTA) 62.5 MCG/INH AEPB Inhale 1 puff into the lungs daily.  . [DISCONTINUED] iron dextran complex (INFED) 50 MG/ML injection Please give infed infusion (no test dose needed) over 4 hours per pharmacy  calculated dose. Ht: 5'2 Wt: 99 pounds Hgb: 12   No facility-administered encounter medications on file as of 02/09/2020.     Patient Active Problem List   Diagnosis Date Noted  . Hematoma 08/07/2019  . Other neutropenia ([place])   . Post-procedural fever 07/20/2019  . Acute febrile illness 07/16/2019  . Pulmonary MAI (mycobacterium avium-intracellulare) infection ([place]) 06/24/2019  . History of total right hip replacement 05/27/2019  . NICM (nonischemic cardiomyopathy) ([place]) 05/19/2019  . Educated about  COVID-19 virus infection 09/07/2018  . Erythropoietin deficiency anemia 06/19/2018  . Swelling of calf 02/04/2018  . Bad odor of urine 12/09/2017  . Iron deficiency anemia due to chronic blood loss 08/14/2017  . Iron malabsorption 08/14/2017  . Hyperlipidemia LDL goal <100 07/08/2017  . Elevated ALT measurement   . Hypotension 06/20/2017  . HLD (hyperlipidemia) 06/20/2017  . CAD (coronary artery disease) 06/20/2017  . Protein-calorie malnutrition, severe ([place]) 06/20/2017  . Generalized weakness   . Weakness 05/30/2017  . Focal neurological deficit 05/30/2017  . Chronic diastolic CHF (congestive heart failure) ([place]) 05/30/2017  . TIA (transient ischemic attack) 05/30/2017  . AKI (acute kidney injury) ([place]) 05/30/2017  . Abnormal LFTs 05/30/2017  . Bronchiectasis ([place]) 04/22/2017  . Right otitis media 07/17/2016  . Cerumen impaction 07/17/2016  . Osteoporosis 03/16/2016  . Headache 11/20/2014  . Skin infection 05/27/2014  . Insomnia 05/27/2014  . Abdominal wall pain 07/09/2013  . Other dysphagia 02/02/2013  . Platelets decreased ([place]) 01/29/2012  . Vitamin D deficiency 12/13/2011  . Hypokalemia 12/13/2011  . Hypocalcemia 12/12/2011  . Adhesions of intestine, very dense, with frozen abdomen 12/11/2011  . SBO (small bowel obstruction), recurrent 12/11/2011  . Protein-calorie malnutrition, moderate ([place]) 12/11/2011  . Oxygen dependent   . Hx of gastric ulcer   . UTI (urinary tract infection) 10/15/2011  . Allergic conjunctivitis 10/15/2011  . Degenerative arthritis of hip 08/03/2011  . B12 deficiency 07/02/2011  . Bilateral leg weakness 01/16/2011  . Anemia, chronic disease 09/14/2010  . Recurrent pneumonia 08/11/2010  . CRT- Medtronic 06/26/2010  . DYSURIA 05/19/2010  . Chronic hypoxemic respiratory failure ([place]) 02/21/2010  . DEHYDRATION 01/06/2010  . GASTROENTERITIS 01/06/2010  . Congestive dilated cardiomyopathy ([place]) 11/17/2009  . LBBB 11/17/2009  . SYSTOLIC HEART  FAILURE, CHRONIC 11/17/2009  . GERD (gastroesophageal reflux disease) 02/17/2009  . NONSPECIFIC ABN FINDNG RAD&OTH EXAM BILARY TRCT 09/03/2008  . COPD with emphysema ([place]) 06/28/2008  . DIVERTICULOSIS, COLON 06/03/2008  . GASTRITIS 06/02/2008  . Atrial fibrillation/flutter 12/08/2007  . GASTROPARESIS 08/07/2007  . NAUSEA, CHRONIC 08/07/2007  . Abdominal pain 08/07/2007  . SYNCOPE 02/17/2007  . RESTLESS LEG SYNDROME 08/01/2006  . Chronic pain syndrome 08/01/2006  . OSTEOARTHRITIS 07/31/2006  . Degenerative disc disease, lumbar 07/31/2006  . Fibromyalgia 07/31/2006  . CHRONIC FATIGUE SYNDROME 07/31/2006  . URINARY INCONTINENCE 07/31/2006  . ANEMIA-IRON DEFICIENCY 05/01/2006  . Essential hypertension 05/01/2006     Health Maintenance Due  Topic Date Due  . COVID-19 Vaccine (1) Never done  . MAMMOGRAM  09/14/2015     Review of Systems Other than healing rib fracture, and pain associated with fracture, baseline shortness of breath, 12 point ros is otherwise negative Physical Exam   BP (!) 150/79   Pulse 84   Wt 90 lb 9.6 oz (41.1 kg)   BMI 17.69 kg/m   Physical Exam  Constitutional:  oriented to person, place, and time. appears well-developed and well-nourished. No distress.  HENT: [place]/AT, PERRLA, no scleral icterus Mouth/Throat: Oropharynx is clear  and moist. No oropharyngeal exudate.  Cardiovascular: Normal rate, regular rhythm and normal heart sounds. Exam reveals no gallop and no friction rub.  No murmur heard.  Pulmonary/Chest: Effort normal and breath sounds normal. No respiratory distress.  has no wheezes.  Neck = supple, no nuchal rigidity Skin: Skin is warm and dry. No rash noted. No erythema.  Psychiatric: a normal mood and affect.  behavior is normal.   CBC Lab Results  Component Value Date   WBC 5.9 02/08/2020   RBC 3.52 (L) 02/08/2020   RBC 3.50 (L) 02/08/2020   HGB 10.4 (L) 02/08/2020   HCT 32.3 (L) 02/08/2020   PLT 230 02/08/2020   MCV 91.8  02/08/2020   MCH 29.5 02/08/2020   MCHC 32.2 02/08/2020   RDW 13.9 02/08/2020   LYMPHSABS 0.6 (L) 02/08/2020   MONOABS 0.6 02/08/2020   EOSABS 0.2 02/08/2020    BMET Lab Results  Component Value Date   NA 138 02/08/2020   K 4.1 02/08/2020   CL 101 02/08/2020   CO2 30 02/08/2020   GLUCOSE 98 02/08/2020   BUN 20 02/08/2020   CREATININE 0.86 02/08/2020   CALCIUM 8.6 (L) 02/08/2020   GFRNONAA >60 02/08/2020   GFRAA >60 12/01/2019      Assessment and Plan  Bronchiectasis/pulmonary MAC = continue with current 3 drug regimen for pulmonary mac. But also Recommend to see pulmonary as part of annual follow up, with Dr Halford Chessman.  Long-term medication management = reviewed her labs and she has not had any untoward side effects  Hypertension = recommend to discuss with pcp if need medication adjustment  Health maintenance = she reports that she has Not had covid vaccine. Recommended for her to get it given her co-morbidities and age. Also has had flu vaccine.`   Will see her in virtual appt in 3 months

## 2020-02-09 NOTE — Telephone Encounter (Signed)
Appointments scheduled calendar printed & mailed per 11/22 los

## 2020-02-11 DIAGNOSIS — J449 Chronic obstructive pulmonary disease, unspecified: Secondary | ICD-10-CM | POA: Diagnosis not present

## 2020-02-11 DIAGNOSIS — I4891 Unspecified atrial fibrillation: Secondary | ICD-10-CM | POA: Diagnosis not present

## 2020-02-16 ENCOUNTER — Other Ambulatory Visit: Payer: Self-pay

## 2020-02-16 ENCOUNTER — Other Ambulatory Visit: Payer: Self-pay | Admitting: Family Medicine

## 2020-02-16 DIAGNOSIS — G4700 Insomnia, unspecified: Secondary | ICD-10-CM

## 2020-02-16 NOTE — Patient Outreach (Signed)
  [facility]) Care Management Chronic Special Needs Program    02/16/2020  Name: [name], DOB: 08-19-1945  MRN: [id number]   [name] is enrolled in a chronic special needs plan for Diabetes.  [facility] Management will continue to provide services for this client through 03/18/2020. [facility] care management team will assume care 03/19/2020.  Peter Garter RN, [name], CDE Chronic Care Management Coordinator [facility] Care Management [phone]

## 2020-02-16 NOTE — Telephone Encounter (Signed)
Requesting: Ambien 5mg  Contract: 11/06/2018 UDS:  None recently seen Last Visit: 08/07/2019 Next Visit: None scheduled  Last Refill: 01/15/2020 #30 and 0RF Pt sig: 1 tab qhs prn  Please Advise

## 2020-02-19 ENCOUNTER — Telehealth: Payer: Self-pay | Admitting: Pharmacist

## 2020-02-19 NOTE — Progress Notes (Addendum)
Chronic Care Management Pharmacy Assistant   Name: SEHER SCHLAGEL  MRN: [id number] DOB: August 31, 1945  Reason for Encounter: Patient Assistance Documentation    PCP : Ann Held, DO  Allergies:   Allergies  Allergen Reactions   Dabigatran Etexilate Mesylate Other (See Comments)    INTERNAL BLEEDING- Pradaxa   Augmentin [Amoxicillin-Pot Clavulanate] Diarrhea and Nausea Only    Did it involve swelling of the face/tongue/throat, SOB, or low BP? No Did it involve sudden or severe rash/hives, skin peeling, or any reaction on the inside of your mouth or nose? No Did you need to seek medical attention at a hospital or doctor's office? No When did it last happen? 02/2019 If all above answers are "NO", may proceed with cephalosporin use.    [place] [Pentazocine] Other (See Comments)    Hallucinations     Medications: Outpatient Encounter Medications as of 02/19/2020  Medication Sig Note   zolpidem (AMBIEN) 5 MG tablet TAKE 1 TABLET BY MOUTH EVERY DAY AT BEDTIME AS NEEDED    acetaminophen (TYLENOL) 650 MG CR tablet Take 650 mg by mouth every 8 (eight) hours as needed for pain.    albuterol (PROVENTIL HFA) 108 (90 Base) MCG/ACT inhaler Inhale 2 puffs into the lungs every 6 (six) hours as needed for wheezing or shortness of breath. 07/15/2019: Gets from the [employer] Patient Assistance Program   amLODipine (NORVASC) 2.5 MG tablet Take 1 tablet (2.5 mg total) by mouth at bedtime.    atorvastatin (LIPITOR) 20 MG tablet TAKE 1 TABLET BY MOUTH ONCE DAILY AT  6PM (Patient taking differently: Take 20 mg by mouth every evening. )    azithromycin (ZITHROMAX) 500 MG tablet Take 500 mg by mouth 3 (three) times a week.    denosumab (PROLIA) 60 MG/ML SOSY injection Inject 60 mg into the skin every 6 (six) months.  (Patient not taking: Reported on 02/09/2020) 07/15/2019: Not yet re-started, per the patient   ELIQUIS 5 MG TABS tablet Take 1 tablet by mouth twice daily    ethambutol (MYAMBUTOL) 100  MG tablet Take by mouth.    ethambutol (MYAMBUTOL) 400 MG tablet Take by mouth.    furosemide (LASIX) 40 MG tablet TAKE 1 TABLET BY MOUTH IN THE MORNING (Patient taking differently: Take 40 mg by mouth in the morning. )    gabapentin (NEURONTIN) 600 MG tablet Take 1 tablet by mouth 4 times daily    lidocaine-prilocaine (EMLA) cream Apply 1 application topically as needed. (Patient taking differently: Apply 1 application topically as needed (for port access- to numb). )    nitrofurantoin (MACRODANTIN) 100 MG capsule Take 100 mg by mouth daily.    nitroGLYCERIN (NITROSTAT) 0.4 MG SL tablet Place 1 tablet (0.4 mg total) under the tongue every 5 (five) minutes as needed. CHEST PAIN    oxyCODONE-acetaminophen (PERCOCET) 10-325 MG tablet Take 1 tablet by mouth every 4 (four) hours as needed. PAIN    OXYGEN Inhale 3 L/min into the lungs continuous.     pantoprazole (PROTONIX) 40 MG tablet Take 1 tablet by mouth twice daily    rifabutin (MYCOBUTIN) 150 MG capsule SMARTSIG:2 Capsule(s) By Mouth 3 Times a Week    tiZANidine (ZANAFLEX) 4 MG tablet Take 1 tablet (4 mg total) by mouth 3 (three) times daily as needed for muscle spasms.    triamcinolone ointment (KENALOG) 0.1 % Apply 1 application topically daily as needed (to affected areas- for psoriasis).     umeclidinium bromide (INCRUSE ELLIPTA) 62.5 MCG/INH  AEPB Inhale 1 puff into the lungs daily.    [DISCONTINUED] iron dextran complex (INFED) 50 MG/ML injection Please give infed infusion (no test dose needed) over 4 hours per pharmacy calculated dose. Ht: 5'2 Wt: 99 pounds Hgb: 12    No facility-administered encounter medications on file as of 02/19/2020.    Current Diagnosis: Patient Active Problem List   Diagnosis Date Noted   Hematoma 08/07/2019   Other neutropenia ([place])    Post-procedural fever 07/20/2019   Acute febrile illness 07/16/2019   Pulmonary MAI (mycobacterium avium-intracellulare) infection ([place]) 06/24/2019   History of total  right hip replacement 05/27/2019   NICM (nonischemic cardiomyopathy) ([place]) 05/19/2019   Educated about COVID-19 virus infection 09/07/2018   Erythropoietin deficiency anemia 06/19/2018   Swelling of calf 02/04/2018   Bad odor of urine 12/09/2017   Iron deficiency anemia due to chronic blood loss 08/14/2017   Iron malabsorption 08/14/2017   Hyperlipidemia LDL goal <100 07/08/2017   Elevated ALT measurement    Hypotension 06/20/2017   HLD (hyperlipidemia) 06/20/2017   CAD (coronary artery disease) 06/20/2017   Protein-calorie malnutrition, severe ([place]) 06/20/2017   Generalized weakness    Weakness 05/30/2017   Focal neurological deficit 05/30/2017   Chronic diastolic CHF (congestive heart failure) ([place]) 05/30/2017   TIA (transient ischemic attack) 05/30/2017   AKI (acute kidney injury) ([place]) 05/30/2017   Abnormal LFTs 05/30/2017   Bronchiectasis ([place]) 04/22/2017   Right otitis media 07/17/2016   Cerumen impaction 07/17/2016   Osteoporosis 03/16/2016   Headache 11/20/2014   Skin infection 05/27/2014   Insomnia 05/27/2014   Abdominal wall pain 07/09/2013   Other dysphagia 02/02/2013   Platelets decreased ([place]) 01/29/2012   Vitamin D deficiency 12/13/2011   Hypokalemia 12/13/2011   Hypocalcemia 12/12/2011   Adhesions of intestine, very dense, with frozen abdomen 12/11/2011   SBO (small bowel obstruction), recurrent 12/11/2011   Protein-calorie malnutrition, moderate ([place]) 12/11/2011   Oxygen dependent    Hx of gastric ulcer    UTI (urinary tract infection) 10/15/2011   Allergic conjunctivitis 10/15/2011   Degenerative arthritis of hip 08/03/2011   B12 deficiency 07/02/2011   Bilateral leg weakness 01/16/2011   Anemia, chronic disease 09/14/2010   Recurrent pneumonia 08/11/2010   CRT- Medtronic 06/26/2010   DYSURIA 05/19/2010   Chronic hypoxemic respiratory failure ([place]) 02/21/2010   DEHYDRATION 01/06/2010   GASTROENTERITIS 01/06/2010   Congestive dilated cardiomyopathy  ([place]) 11/17/2009   LBBB 83/41/9622   SYSTOLIC HEART FAILURE, CHRONIC 11/17/2009   GERD (gastroesophageal reflux disease) 02/17/2009   NONSPECIFIC ABN FINDNG RAD&OTH EXAM BILARY TRCT 09/03/2008   COPD with emphysema ([place]) 06/28/2008   DIVERTICULOSIS, COLON 06/03/2008   GASTRITIS 06/02/2008   Atrial fibrillation/flutter 12/08/2007   GASTROPARESIS 08/07/2007   NAUSEA, CHRONIC 08/07/2007   Abdominal pain 08/07/2007   SYNCOPE 02/17/2007   RESTLESS LEG SYNDROME 08/01/2006   Chronic pain syndrome 08/01/2006   OSTEOARTHRITIS 07/31/2006   Degenerative disc disease, lumbar 07/31/2006   Fibromyalgia 07/31/2006   CHRONIC FATIGUE SYNDROME 07/31/2006   URINARY INCONTINENCE 07/31/2006   ANEMIA-IRON DEFICIENCY 05/01/2006   Essential hypertension 05/01/2006    Goals Addressed   None    Contacted the patient to inquire if she had returned out of pocket expenses to attach to her PAP application for [employer]. Patient stated her husband just completed the remainder of the application and gathered the out of pocket expenses as requested. She stated the original paperwork completed had been misplaced and this was the second time it needed to be  done. She stated they would drop the paperwork off to the clinic on Monday morning. Also inquired if she had an appointment made to see Dr. Etter Sjogren in March. I was unable to verify an appointment made with the PCP in March, but did remind the Ms. Jamie-Lee of her appointment with the clinical pharmacist on Tuesday February 22nd at 4:00 pm over the telephone. Advised that she contact her PCP to schedule an appointment.  Follow-Up:  Pharmacist Review   Fanny Skates, [place] Pharmacist Assistant [phone]  Clarified with Ermalinda Barrios that patient has indeed submitted the required paperwork.  Original fax submission on 02/05/20 failed. Resent fax today 02/19/20.  Will have [place] to follow up on receipt to [place].   20 minutes spent in review, coordination, and documentation.     Reviewed by: De Blanch, PharmD, BCACP Clinical Pharmacist [facility] [phone]

## 2020-02-23 IMAGING — CT CT CHEST HIGH RESOLUTION W/O CM
2 of 8 series · 14 of 36 positions shown, 17 images · non-contrast
Comparison: 03/28/2017.

CLINICAL DATA: Persistent cough, bronchiectasis.

EXAM:
CT CHEST WITHOUT CONTRAST
TECHNIQUE: Multidetector CT imaging of the chest was performed following the
standard protocol without intravenous contrast. High resolution
imaging of the lungs, as well as inspiratory and expiratory imaging,
was performed.

[Series 5: high resolution · axial · 0.57mm/px · z∈[-308,-54]mm · 11 of 306 slices shown, 14 images]
[im 26/306  mediastinal]
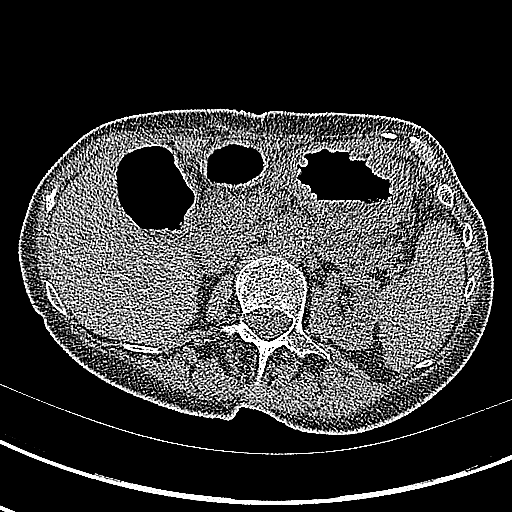
[im 26/306  lung]
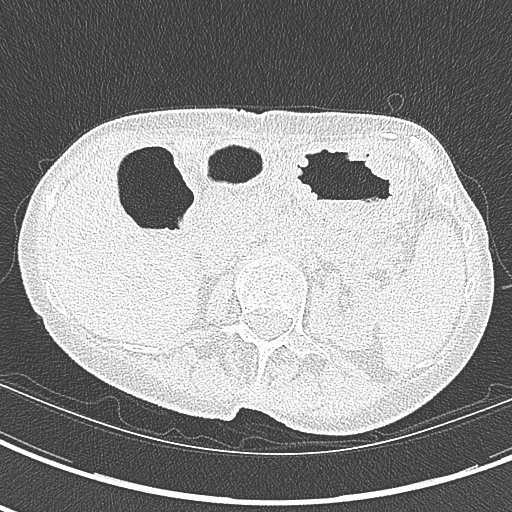
[im 51/306  lung]
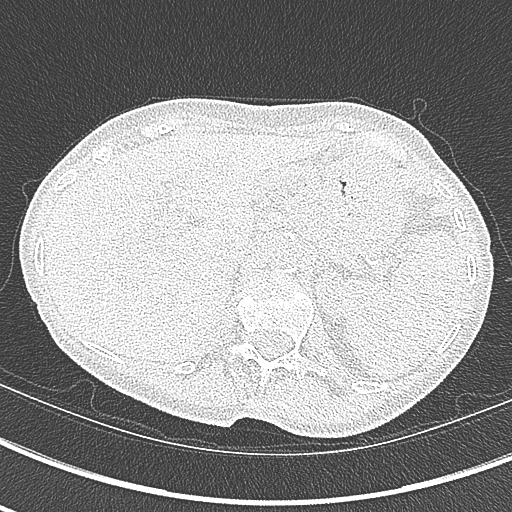
[im 77/306  lung]
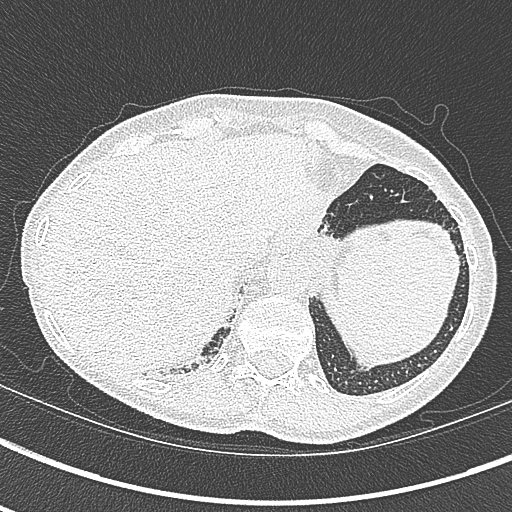
[im 102/306  lung]
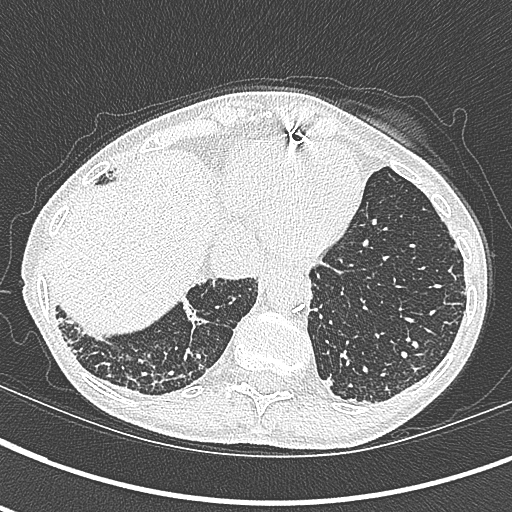
[im 128/306  mediastinal]
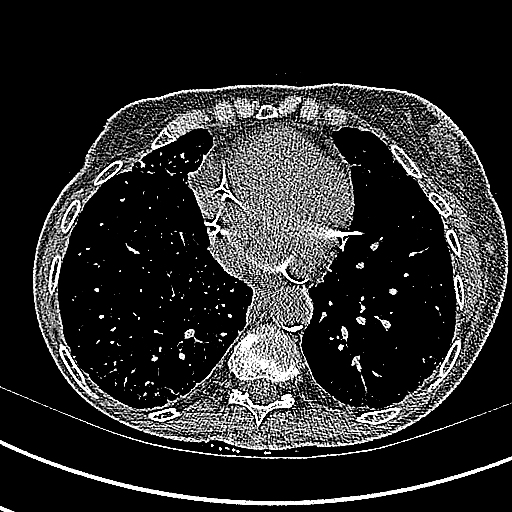
[im 128/306  lung]
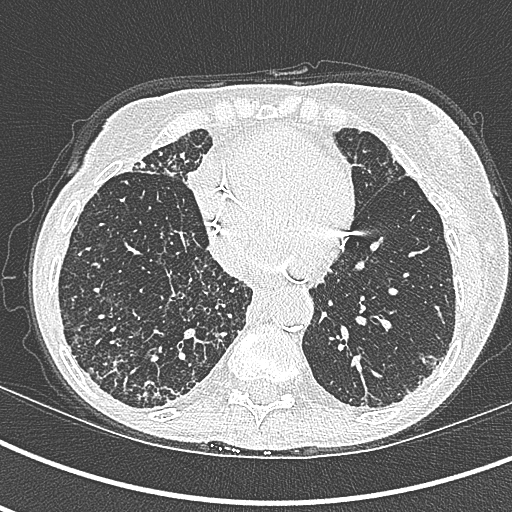
[im 153/306  lung]
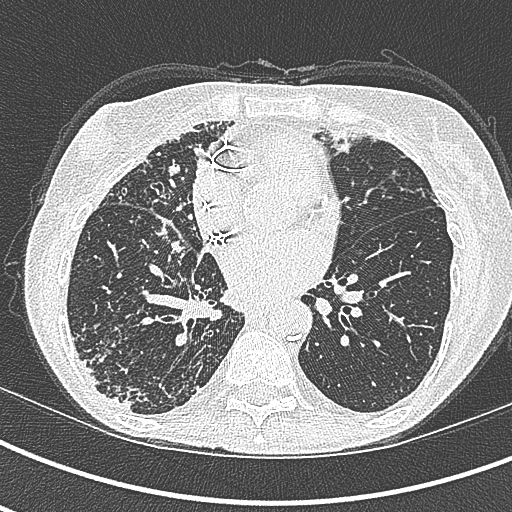
[im 178/306  lung]
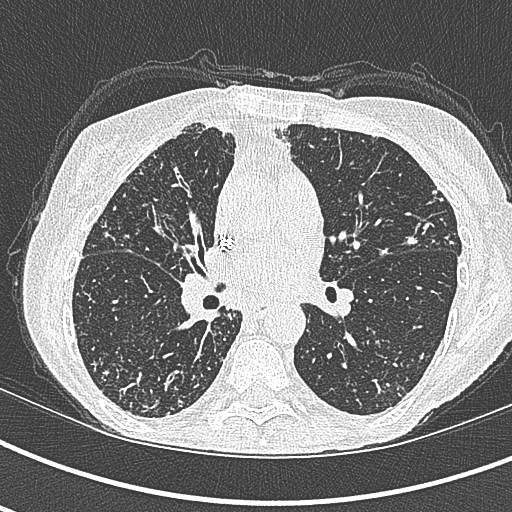
[im 204/306  lung]
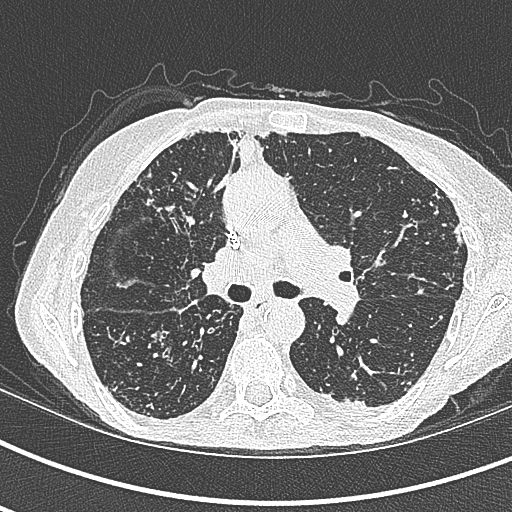
[im 229/306  mediastinal]
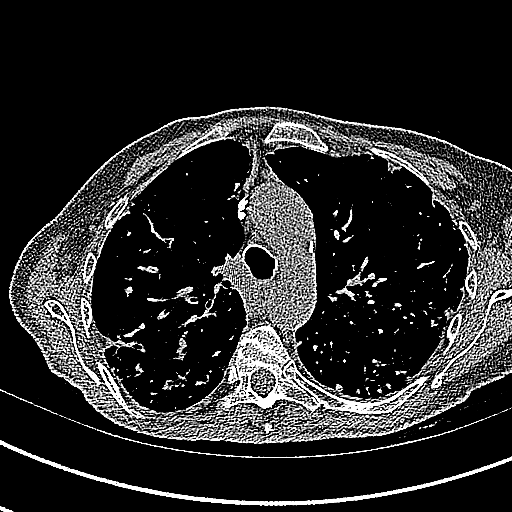
[im 229/306  lung]
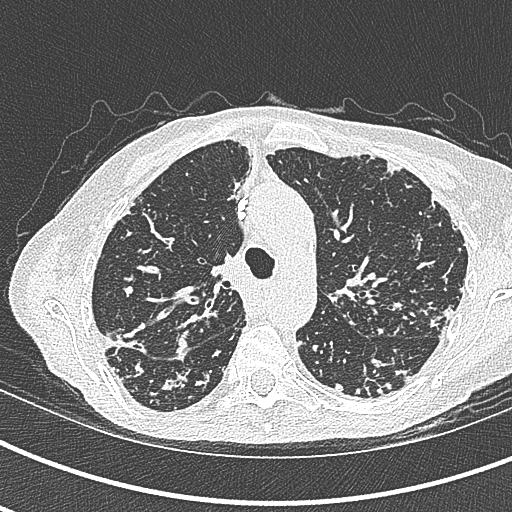
[im 255/306  lung]
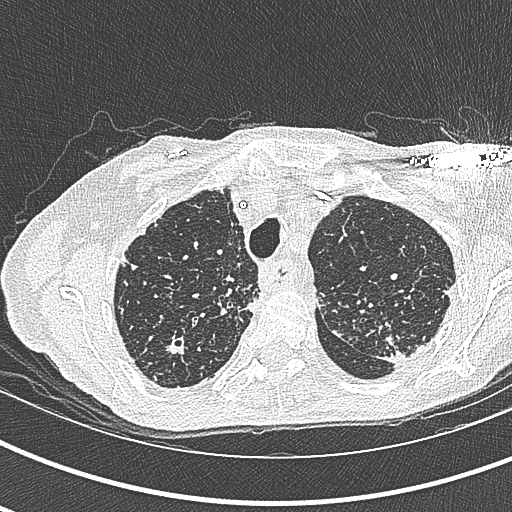
[im 280/306  lung]
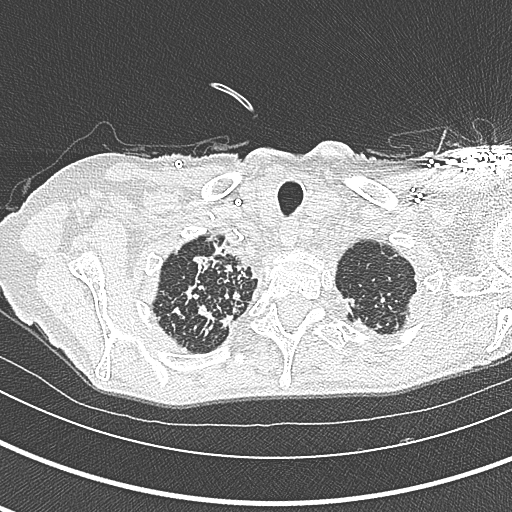

[Series 7: coronal · coronal · 0.57mm/px · 3 of 113 slices shown]
[im 23/113  lung]
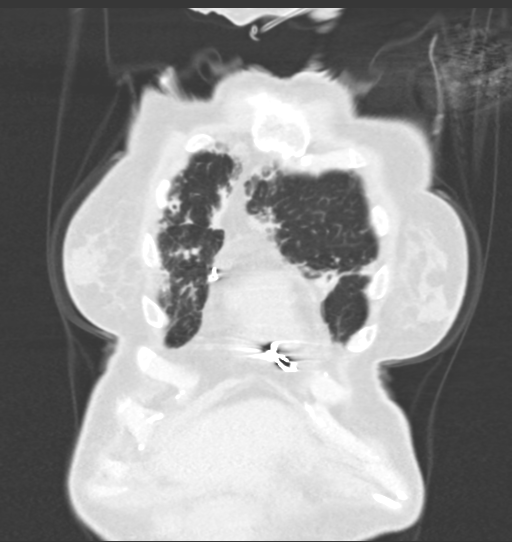
[im 45/113  lung]
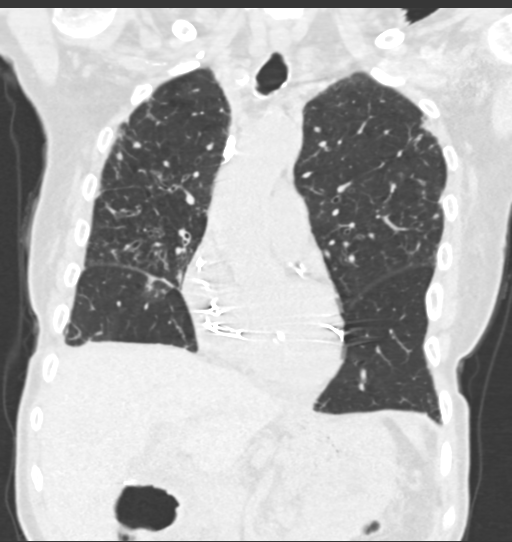
[im 68/113  lung]
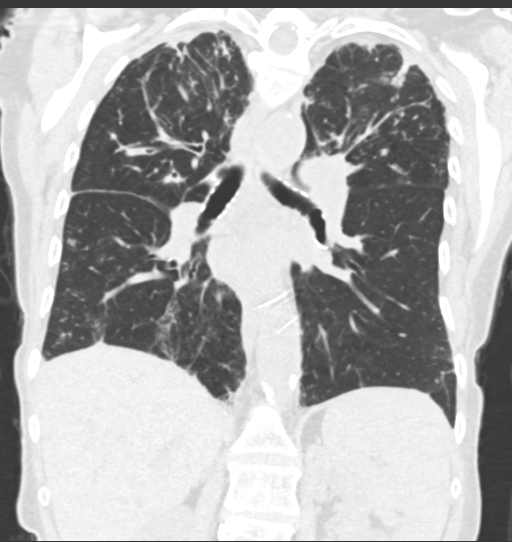

[14 of 36 positions shown; findings below may reference images not displayed]

FINDINGS: Cardiovascular: Right IJ Port-A-Cath terminates in the right atrium.
Atherosclerotic calcification of the aorta, aortic valve and
coronary arteries. Heart size normal. No pericardial effusion.

Mediastinum/Nodes: Mediastinal lymph nodes measure up to 10 mm in
the high right paratracheal station, as before. Hilar regions are
difficult to definitively evaluate without IV contrast. No axillary
adenopathy. Esophagus is grossly unremarkable.

Lungs/Pleura: Extensive peribronchovascular nodularity and nodular
consolidation with cylindrical bronchiectasis, progressive from
03/28/2017. No pleural fluid. Airway is unremarkable. Expiratory
phase imaging was not performed in true expiration, limiting the
evaluation for air trapping.

Upper Abdomen: Visualized portions of the liver, adrenal glands and
right kidney are unremarkable. Subcentimeter hyperattenuating lesion
in the left kidney is too small to characterize. Subcentimeter
low-attenuation lesions in the spleen are too small to characterize.
There may be a 7 mm calcified splenic artery aneurysm. Visualized
portion of the pancreas is unremarkable. Difficult to exclude
gastric fold thickening.

Musculoskeletal: No worrisome lytic or sclerotic lesions.
IMPRESSION: 1. Mycobacterium avium complex, progressive from 03/28/2017.
2. Possible 7 mm splenic artery aneurysm.
3. Aortic atherosclerosis ([id number]). Coronary artery
calcification.

## 2020-02-29 ENCOUNTER — Inpatient Hospital Stay: Payer: HMO

## 2020-02-29 ENCOUNTER — Other Ambulatory Visit: Payer: Self-pay | Admitting: Family Medicine

## 2020-02-29 ENCOUNTER — Telehealth: Payer: Self-pay | Admitting: Family Medicine

## 2020-02-29 DIAGNOSIS — R52 Pain, unspecified: Secondary | ICD-10-CM

## 2020-02-29 MED ORDER — GABAPENTIN 600 MG PO TABS: ORAL_TABLET | ORAL | 5 refills | Status: DC

## 2020-02-29 NOTE — Telephone Encounter (Signed)
Medication: gabapentin (NEURONTIN) 600 MG tablet [[id number]]    Has the patient contacted their pharmacy? No. (If no, request that the patient contact the pharmacy for the refill.) (If yes, when and what did the pharmacy advise?)  Preferred Pharmacy (with phone number or street name):  [facility], [place] [address] [place], [place] [address]  Phone:  [phone] Fax:  [phone]  DEA #:  [id number]  [id number] Reason: --    Agent: Please be advised that RX refills may take up to 3 business days. We ask that you follow-up with your pharmacy.

## 2020-02-29 NOTE — Telephone Encounter (Signed)
Okay to refill? 

## 2020-02-29 NOTE — Telephone Encounter (Signed)
Patient husband calling to check the status of medication

## 2020-02-29 NOTE — Telephone Encounter (Signed)
Done

## 2020-03-01 ENCOUNTER — Other Ambulatory Visit: Payer: Self-pay | Admitting: Family Medicine

## 2020-03-01 DIAGNOSIS — R52 Pain, unspecified: Secondary | ICD-10-CM

## 2020-03-01 MED ORDER — OXYCODONE-ACETAMINOPHEN 10-325 MG PO TABS: 1.0000 | ORAL_TABLET | ORAL | 0 refills | Status: DC | PRN

## 2020-03-01 NOTE — Telephone Encounter (Signed)
Requesting: Percocet Contract: 11/06/18 UDS: 11/06/18 Last OV: 08/07/19 Next OV: 05/10/20 w/ Melvenia Beam Last Refill: 02/05/20, #120--0 RF Database:   Please advise

## 2020-03-01 NOTE — Telephone Encounter (Signed)
Medication:oxyCODONE-acetaminophen (PERCOCET) 10-325 MG tablet [[id number]]  Has the patient contacted their pharmacy? No. (If no, request that the patient contact the pharmacy for the refill.) (If yes, when and what did the pharmacy advise?)  Preferred Pharmacy (with phone number or street name): [facility], [address] [place], [place] [address]  Phone:  [phone] Fax:  [phone]  DEA #:  [id number]  Agent: Please be advised that RX refills may take up to 3 business days. We ask that you follow-up with your pharmacy.

## 2020-03-16 ENCOUNTER — Other Ambulatory Visit: Payer: Self-pay | Admitting: Family Medicine

## 2020-03-16 ENCOUNTER — Other Ambulatory Visit: Payer: Self-pay | Admitting: Cardiology

## 2020-03-16 DIAGNOSIS — G4700 Insomnia, unspecified: Secondary | ICD-10-CM

## 2020-03-16 NOTE — Telephone Encounter (Signed)
Requesting:zolpidem Contract: 11/06/18 UDS: 11/06/18 Last Visit: 08/07/19 Next Visit: n/a Last Refill: 02/16/20  Please Advise

## 2020-03-21 ENCOUNTER — Inpatient Hospital Stay: Payer: HMO

## 2020-03-21 ENCOUNTER — Other Ambulatory Visit: Payer: Self-pay

## 2020-03-21 ENCOUNTER — Inpatient Hospital Stay (HOSPITAL_BASED_OUTPATIENT_CLINIC_OR_DEPARTMENT_OTHER): Payer: HMO | Admitting: Family

## 2020-03-21 ENCOUNTER — Inpatient Hospital Stay: Payer: HMO | Attending: Family

## 2020-03-21 ENCOUNTER — Encounter: Payer: Self-pay | Admitting: Family

## 2020-03-21 VITALS — BP 151/70 | HR 107 | Temp 98.2°F | Resp 18 | Wt 88.0 lb

## 2020-03-21 DIAGNOSIS — Z79899 Other long term (current) drug therapy: Secondary | ICD-10-CM | POA: Insufficient documentation

## 2020-03-21 DIAGNOSIS — M797 Fibromyalgia: Secondary | ICD-10-CM | POA: Diagnosis not present

## 2020-03-21 DIAGNOSIS — D631 Anemia in chronic kidney disease: Secondary | ICD-10-CM

## 2020-03-21 DIAGNOSIS — Z95828 Presence of other vascular implants and grafts: Secondary | ICD-10-CM

## 2020-03-21 DIAGNOSIS — D500 Iron deficiency anemia secondary to blood loss (chronic): Secondary | ICD-10-CM

## 2020-03-21 DIAGNOSIS — D508 Other iron deficiency anemias: Secondary | ICD-10-CM | POA: Diagnosis not present

## 2020-03-21 DIAGNOSIS — K909 Intestinal malabsorption, unspecified: Secondary | ICD-10-CM | POA: Diagnosis not present

## 2020-03-21 DIAGNOSIS — Z7901 Long term (current) use of anticoagulants: Secondary | ICD-10-CM | POA: Insufficient documentation

## 2020-03-21 DIAGNOSIS — N189 Chronic kidney disease, unspecified: Secondary | ICD-10-CM | POA: Insufficient documentation

## 2020-03-21 LAB — CBC WITH DIFFERENTIAL (CANCER CENTER ONLY)
Abs Immature Granulocytes: 0.01 10*3/uL (ref 0.00–0.07)
Basophils Absolute: 0 10*3/uL (ref 0.0–0.1)
Basophils Relative: 1 %
Eosinophils Absolute: 0.1 10*3/uL (ref 0.0–0.5)
Eosinophils Relative: 2 %
HCT: 36.2 % (ref 36.0–46.0)
Hemoglobin: 11.4 g/dL — ABNORMAL LOW (ref 12.0–15.0)
Immature Granulocytes: 0 %
Lymphocytes Relative: 9 %
Lymphs Abs: 0.3 10*3/uL — ABNORMAL LOW (ref 0.7–4.0)
MCH: 28.8 pg (ref 26.0–34.0)
MCHC: 31.5 g/dL (ref 30.0–36.0)
MCV: 91.4 fL (ref 80.0–100.0)
Monocytes Absolute: 0.6 10*3/uL (ref 0.1–1.0)
Monocytes Relative: 15 %
Neutro Abs: 2.9 10*3/uL (ref 1.7–7.7)
Neutrophils Relative %: 73 %
Platelet Count: 191 10*3/uL (ref 150–400)
RBC: 3.96 MIL/uL (ref 3.87–5.11)
RDW: 15.1 % (ref 11.5–15.5)
WBC Count: 3.9 10*3/uL — ABNORMAL LOW (ref 4.0–10.5)
nRBC: 0 % (ref 0.0–0.2)

## 2020-03-21 LAB — CMP (CANCER CENTER ONLY)
ALT: 14 U/L (ref 0–44)
AST: 21 U/L (ref 15–41)
Albumin: 4.1 g/dL (ref 3.5–5.0)
Alkaline Phosphatase: 143 U/L — ABNORMAL HIGH (ref 38–126)
Anion gap: 8 (ref 5–15)
BUN: 17 mg/dL (ref 8–23)
CO2: 30 mmol/L (ref 22–32)
Calcium: 8.5 mg/dL — ABNORMAL LOW (ref 8.9–10.3)
Chloride: 99 mmol/L (ref 98–111)
Creatinine: 0.94 mg/dL (ref 0.44–1.00)
GFR, Estimated: >60 mL/min (ref >60)
Glucose, Bld: 103 mg/dL — ABNORMAL HIGH (ref 70–99)
Potassium: 4.2 mmol/L (ref 3.5–5.1)
Sodium: 137 mmol/L (ref 135–145)
Total Bilirubin: 0.3 mg/dL (ref 0.3–1.2)
Total Protein: 7.1 g/dL (ref 6.5–8.1)

## 2020-03-21 LAB — RETICULOCYTES
Immature Retic Fract: 4 % (ref 2.3–15.9)
RBC.: 3.94 MIL/uL (ref 3.87–5.11)
Retic Count, Absolute: 19.3 10*3/uL (ref 19.0–186.0)
Retic Ct Pct: 0.5 % (ref 0.4–3.1)

## 2020-03-21 MED ORDER — HEPARIN SOD (PORK) LOCK FLUSH 100 UNIT/ML IV SOLN
500.0000 [IU] | Freq: Once | INTRAVENOUS | Status: AC
  Administered 2020-03-21: 500 [IU] via INTRAVENOUS
  Filled 2020-03-21: qty 5

## 2020-03-21 MED ORDER — SODIUM CHLORIDE 0.9% FLUSH
10.0000 mL | INTRAVENOUS | Status: DC | PRN
  Administered 2020-03-21: 10 mL via INTRAVENOUS
  Filled 2020-03-21: qty 10

## 2020-03-21 NOTE — Progress Notes (Signed)
Hematology and Oncology Follow Up Visit  ERIE SICA [id number] 1945-10-25 75 y.o. 03/21/2020   Principle Diagnosis:  Iron deficiency anemia Erythropoietin deficient anemia  Current Therapy: IV iron as indicated Retacrit 40,000 units SQfor hgb <11   Interim History:  [name] is here today for follow-up. She is doing fairly well. SOB is stable at this time on 3L [place] 24 hours a day.  She has not noted any blood loss. No abnormal bruising. No petechiae.  No fever, chills, n/v, cough, rash, dizziness, chest pain, palpitations, abdominal pain or changes in bowel or bladder habits.  She has generalized aches and pains with fibromyalgia. Neurontin is effective in managing her pain.  No falls or syncopal episodes to report since her last visit.  She has neuropathy in her hands which she describes as stable/unchanged.  She has maintained a good appetite and is staying well hydrated. Her weight is stable at 88 lbs.   ECOG Performance Status: 1 - Symptomatic but completely ambulatory  Medications:  Allergies as of 03/21/2020      Reactions   Dabigatran Etexilate Mesylate Other (See Comments)   INTERNAL BLEEDING- Pradaxa   Augmentin [amoxicillin-pot Clavulanate] Diarrhea, Nausea Only   Did it involve swelling of the face/tongue/throat, SOB, or low BP? No Did it involve sudden or severe rash/hives, skin peeling, or any reaction on the inside of your mouth or nose? No Did you need to seek medical attention at a hospital or doctor's office? No When did it last happen?02/2019 If all above answers are "NO", may proceed with cephalosporin use.   Talwin [pentazocine] Other (See Comments)   Hallucinations      Medication List       Accurate as of March 21, 2020  1:47 PM. If you have any questions, ask your nurse or doctor.        acetaminophen 650 MG CR tablet Commonly known as: TYLENOL Take 650 mg by mouth every 8 (eight) hours as needed for pain.   amLODipine 2.5 MG  tablet Commonly known as: NORVASC Take 1 tablet (2.5 mg total) by mouth at bedtime.   atorvastatin 20 MG tablet Commonly known as: LIPITOR TAKE 1 TABLET BY MOUTH ONCE DAILY AT  6PM What changed:   how much to take  how to take this  when to take this  additional instructions   azithromycin 500 MG tablet Commonly known as: ZITHROMAX Take 500 mg by mouth 3 (three) times a week.   denosumab 60 MG/ML Sosy injection Commonly known as: PROLIA Inject 60 mg into the skin every 6 (six) months.   Eliquis 5 MG Tabs tablet Generic drug: apixaban Take 1 tablet by mouth twice daily   ethambutol 100 MG tablet Commonly known as: MYAMBUTOL Take by mouth.   ethambutol 400 MG tablet Commonly known as: MYAMBUTOL Take by mouth.   furosemide 40 MG tablet Commonly known as: LASIX TAKE 1 TABLET BY MOUTH IN THE MORNING   gabapentin 600 MG tablet Commonly known as: NEURONTIN 1 po qid   Incruse Ellipta 62.5 MCG/INH Aepb Generic drug: umeclidinium bromide Inhale 1 puff into the lungs daily.   lidocaine-prilocaine cream Commonly known as: EMLA Apply 1 application topically as needed. What changed: reasons to take this   nitrofurantoin 100 MG capsule Commonly known as: MACRODANTIN Take 100 mg by mouth daily.   nitroGLYCERIN 0.4 MG SL tablet Commonly known as: NITROSTAT Place 1 tablet (0.4 mg total) under the tongue every 5 (five) minutes as needed. CHEST  PAIN   oxyCODONE-acetaminophen 10-325 MG tablet Commonly known as: PERCOCET Take 1 tablet by mouth every 4 (four) hours as needed. PAIN   OXYGEN Inhale 3 L/min into the lungs continuous.   pantoprazole 40 MG tablet Commonly known as: PROTONIX Take 1 tablet by mouth twice daily   Proventil HFA 108 (90 Base) MCG/ACT inhaler Generic drug: albuterol Inhale 2 puffs into the lungs every 6 (six) hours as needed for wheezing or shortness of breath.   rifabutin 150 MG capsule Commonly known as: MYCOBUTIN SMARTSIG:2 Capsule(s)  By Mouth 3 Times a Week   tiZANidine 4 MG tablet Commonly known as: ZANAFLEX Take 1 tablet (4 mg total) by mouth 3 (three) times daily as needed for muscle spasms.   triamcinolone ointment 0.1 % Commonly known as: KENALOG Apply 1 application topically daily as needed (to affected areas- for psoriasis).   zolpidem 5 MG tablet Commonly known as: AMBIEN TAKE 1 TABLET BY MOUTH EVERY DAY AT BEDTIME AS NEEDED       Allergies:  Allergies  Allergen Reactions  . Dabigatran Etexilate Mesylate Other (See Comments)    INTERNAL BLEEDING- Pradaxa  . Augmentin [Amoxicillin-Pot Clavulanate] Diarrhea and Nausea Only    Did it involve swelling of the face/tongue/throat, SOB, or low BP? No Did it involve sudden or severe rash/hives, skin peeling, or any reaction on the inside of your mouth or nose? No Did you need to seek medical attention at a hospital or doctor's office? No When did it last happen?02/2019 If all above answers are "NO", may proceed with cephalosporin use.   [name] [Pentazocine] Other (See Comments)    Hallucinations     Past Medical History, Surgical history, Social history, and Family History were reviewed and updated.  Review of Systems: All other 10 point review of systems is negative.   Physical Exam:  vitals were not taken for this visit.   Wt Readings from Last 3 Encounters:  02/09/20 90 lb 9.6 oz (41.1 kg)  02/08/20 89 lb (40.4 kg)  12/28/19 89 lb 5 oz (40.5 kg)    Ocular: Sclerae unicteric, pupils equal, round and reactive to light Ear-nose-throat: Oropharynx clear, dentition fair Lymphatic: No cervical or supraclavicular adenopathy Lungs no rales or rhonchi, good excursion bilaterally Heart regular rate and rhythm, no murmur appreciated Abd soft, nontender, positive bowel sounds MSK no focal spinal tenderness, no joint edema Neuro: non-focal, well-oriented, appropriate affect Breasts: Deferred   Lab Results  Component Value Date   WBC 5.9  02/08/2020   HGB 10.4 (L) 02/08/2020   HCT 32.3 (L) 02/08/2020   MCV 91.8 02/08/2020   PLT 230 02/08/2020   Lab Results  Component Value Date   FERRITIN 818 (H) 02/08/2020   IRON 68 02/08/2020   TIBC 240 02/08/2020   UIBC 172 02/08/2020   IRONPCTSAT 28 02/08/2020   Lab Results  Component Value Date   RETICCTPCT 0.8 02/08/2020   RBC 3.52 (L) 02/08/2020   RBC 3.50 (L) 02/08/2020   RETICCTABS 64.6 04/01/2009   No results found for: KPAFRELGTCHN, LAMBDASER, KAPLAMBRATIO No results found for: IGGSERUM, IGA, IGMSERUM No results found for: Ronnald Ramp, A1GS, A2GS, Violet Baldy, MSPIKE, SPEI   Chemistry      Component Value Date/Time   NA 138 02/08/2020 1513   NA 135 06/16/2019 1333   K 4.1 02/08/2020 1513   CL 101 02/08/2020 1513   CO2 30 02/08/2020 1513   BUN 20 02/08/2020 1513   BUN 31 (H) 06/16/2019 1333  CREATININE 0.86 02/08/2020 1513   CREATININE 0.91 03/16/2016 1539      Component Value Date/Time   CALCIUM 8.6 (L) 02/08/2020 1513   ALKPHOS 133 (H) 02/08/2020 1513   AST 20 02/08/2020 1513   ALT 10 02/08/2020 1513   BILITOT 0.3 02/08/2020 1513       Impression and Plan: Ms. Louis is a very pleasant 75yo caucasian female with iron deficiency anemia secondary to malabsorption and possible GI blood loss with Eliquis. No ESA needed this visit, Hgb 11.4.  Iron studies are pending. We will replace if needed.  Lab and injection in 3 weeks, follow-up in 6 weeks.  She will contact our office with any questions or concerns.   Laverna Peace, NP 1/3/20221:47 PM

## 2020-03-22 ENCOUNTER — Telehealth: Payer: Self-pay

## 2020-03-22 LAB — IRON AND TIBC
Iron: 50 ug/dL (ref 41–142)
Saturation Ratios: 23 % (ref 21–57)
TIBC: 221 ug/dL — ABNORMAL LOW (ref 236–444)
UIBC: 171 ug/dL (ref 120–384)

## 2020-03-22 LAB — FERRITIN: Ferritin: 795 ng/mL — ABNORMAL HIGH (ref 11–307)

## 2020-03-22 NOTE — Telephone Encounter (Signed)
S/w pt per 03/21/20 los and she is aware of all appts    aom

## 2020-03-24 ENCOUNTER — Other Ambulatory Visit: Payer: Self-pay

## 2020-03-24 ENCOUNTER — Other Ambulatory Visit: Payer: Self-pay | Admitting: Family Medicine

## 2020-03-24 DIAGNOSIS — R52 Pain, unspecified: Secondary | ICD-10-CM

## 2020-03-24 MED ORDER — OXYCODONE-ACETAMINOPHEN 10-325 MG PO TABS: 1.0000 | ORAL_TABLET | ORAL | 0 refills | Status: DC | PRN

## 2020-03-24 NOTE — Telephone Encounter (Signed)
Medication: oxyCODONE-acetaminophen (PERCOCET) 10-325 MG tablet [[id number]]       Has the patient contacted their pharmacy?  (If no, request that the patient contact the pharmacy for the refill.) (If yes, when and what did the pharmacy advise?)     Preferred Pharmacy (with phone number or street name):  [facility], [address] [place], [place] [address]  Phone:  [phone] Fax:  [phone]     Agent: Please be advised that RX refills may take up to 3 business days. We ask that you follow-up with your pharmacy.

## 2020-03-24 NOTE — Telephone Encounter (Signed)
Requesting: oxycodone Contract: None recent UDS: 11/06/2018 Last Visit:  08/07/2019 Next Visit: None Last Refill: 03/01/2020 #120 and 0RF  Pt sig: 1 tab q4h prn  Please Advise

## 2020-04-05 ENCOUNTER — Other Ambulatory Visit: Payer: Self-pay | Admitting: Cardiology

## 2020-04-05 DIAGNOSIS — I10 Essential (primary) hypertension: Secondary | ICD-10-CM

## 2020-04-08 ENCOUNTER — Other Ambulatory Visit: Payer: Self-pay | Admitting: *Deleted

## 2020-04-08 DIAGNOSIS — D500 Iron deficiency anemia secondary to blood loss (chronic): Secondary | ICD-10-CM

## 2020-04-11 ENCOUNTER — Inpatient Hospital Stay: Payer: HMO

## 2020-04-11 ENCOUNTER — Other Ambulatory Visit: Payer: Self-pay

## 2020-04-11 VITALS — BP 146/77 | HR 85 | Resp 19

## 2020-04-11 DIAGNOSIS — D500 Iron deficiency anemia secondary to blood loss (chronic): Secondary | ICD-10-CM

## 2020-04-11 DIAGNOSIS — N189 Chronic kidney disease, unspecified: Secondary | ICD-10-CM | POA: Diagnosis not present

## 2020-04-11 LAB — CBC WITH DIFFERENTIAL (CANCER CENTER ONLY)
Abs Immature Granulocytes: 0.02 10*3/uL (ref 0.00–0.07)
Basophils Absolute: 0.1 10*3/uL (ref 0.0–0.1)
Basophils Relative: 1 %
Eosinophils Absolute: 0.1 10*3/uL (ref 0.0–0.5)
Eosinophils Relative: 2 %
HCT: 31.8 % — ABNORMAL LOW (ref 36.0–46.0)
Hemoglobin: 10.2 g/dL — ABNORMAL LOW (ref 12.0–15.0)
Immature Granulocytes: 0 %
Lymphocytes Relative: 6 %
Lymphs Abs: 0.4 10*3/uL — ABNORMAL LOW (ref 0.7–4.0)
MCH: 29.1 pg (ref 26.0–34.0)
MCHC: 32.1 g/dL (ref 30.0–36.0)
MCV: 90.9 fL (ref 80.0–100.0)
Monocytes Absolute: 0.7 10*3/uL (ref 0.1–1.0)
Monocytes Relative: 10 %
Neutro Abs: 6.1 10*3/uL (ref 1.7–7.7)
Neutrophils Relative %: 81 %
Platelet Count: 277 10*3/uL (ref 150–400)
RBC: 3.5 MIL/uL — ABNORMAL LOW (ref 3.87–5.11)
RDW: 15 % (ref 11.5–15.5)
WBC Count: 7.4 10*3/uL (ref 4.0–10.5)
nRBC: 0 % (ref 0.0–0.2)

## 2020-04-11 LAB — CMP (CANCER CENTER ONLY)
ALT: 6 U/L (ref 0–44)
AST: 15 U/L (ref 15–41)
Albumin: 4.1 g/dL (ref 3.5–5.0)
Alkaline Phosphatase: 119 U/L (ref 38–126)
Anion gap: 9 (ref 5–15)
BUN: 17 mg/dL (ref 8–23)
CO2: 30 mmol/L (ref 22–32)
Calcium: 9 mg/dL (ref 8.9–10.3)
Chloride: 99 mmol/L (ref 98–111)
Creatinine: 0.79 mg/dL (ref 0.44–1.00)
GFR, Estimated: >60 mL/min (ref >60)
Glucose, Bld: 104 mg/dL — ABNORMAL HIGH (ref 70–99)
Potassium: 4.1 mmol/L (ref 3.5–5.1)
Sodium: 138 mmol/L (ref 135–145)
Total Bilirubin: 0.4 mg/dL (ref 0.3–1.2)
Total Protein: 7.3 g/dL (ref 6.5–8.1)

## 2020-04-11 MED ORDER — HEPARIN SOD (PORK) LOCK FLUSH 100 UNIT/ML IV SOLN
500.0000 [IU] | Freq: Once | INTRAVENOUS | Status: AC
  Administered 2020-04-11: 500 [IU] via INTRAVENOUS
  Filled 2020-04-11: qty 5

## 2020-04-11 MED ORDER — EPOETIN ALFA-EPBX 40000 UNIT/ML IJ SOLN
INTRAMUSCULAR | Status: AC
  Filled 2020-04-11: qty 1

## 2020-04-11 MED ORDER — SODIUM CHLORIDE 0.9% FLUSH
10.0000 mL | Freq: Once | INTRAVENOUS | Status: AC
  Administered 2020-04-11: 10 mL via INTRAVENOUS
  Filled 2020-04-11: qty 10

## 2020-04-11 MED ORDER — EPOETIN ALFA-EPBX 40000 UNIT/ML IJ SOLN
40000.0000 [IU] | Freq: Once | INTRAMUSCULAR | Status: AC
  Administered 2020-04-11: 40000 [IU] via SUBCUTANEOUS

## 2020-04-11 NOTE — Patient Instructions (Signed)
[employer] medicine (25th ed., pp. [phone]). [place], [place]: Elsevier.">  Anemia  Anemia is a condition in which there is not enough red blood cells or hemoglobin in the blood. Hemoglobin is a substance in red blood cells that carries oxygen. When you do not have enough red blood cells or hemoglobin (are anemic), your body cannot get enough oxygen and your organs may not work properly. As a result, you may feel very tired or have other problems. What are the causes? Common causes of anemia include:  Excessive bleeding. Anemia can be caused by excessive bleeding inside or outside the body, including bleeding from the intestines or from heavy menstrual periods in females.  Poor nutrition.  Long-lasting (chronic) kidney, thyroid, and liver disease.  Bone marrow disorders, spleen problems, and blood disorders.  Cancer and treatments for cancer.  HIV (human immunodeficiency virus) and AIDS (acquired immunodeficiency syndrome).  Infections, medicines, and autoimmune disorders that destroy red blood cells. What are the signs or symptoms? Symptoms of this condition include:  Minor weakness.  Dizziness.  Headache, or difficulties concentrating and sleeping.  Heartbeats that feel irregular or faster than normal (palpitations).  Shortness of breath, especially with exercise.  Pale skin, lips, and nails, or cold hands and feet.  Indigestion and nausea. Symptoms may occur suddenly or develop slowly. If your anemia is mild, you may not have symptoms. How is this diagnosed? This condition is diagnosed based on blood tests, your medical history, and a physical exam. In some cases, a test may be needed in which cells are removed from the soft tissue inside of a bone and looked at under a microscope (bone marrow biopsy). Your health care provider may also check your stool (feces) for blood and may do additional testing to look for the cause of your bleeding. Other tests may  include:  Imaging tests, such as a CT scan or MRI.  A procedure to see inside your esophagus and stomach (endoscopy).  A procedure to see inside your colon and rectum (colonoscopy). How is this treated? Treatment for this condition depends on the cause. If you continue to lose a lot of blood, you may need to be treated at a hospital. Treatment may include:  Taking supplements of iron, vitamin Q68, or folic acid.  Taking a hormone medicine (erythropoietin) that can help to stimulate red blood cell growth.  Having a blood transfusion. This may be needed if you lose a lot of blood.  Making changes to your diet.  Having surgery to remove your spleen. Follow these instructions at home:  Take over-the-counter and prescription medicines only as told by your health care provider.  Take supplements only as told by your health care provider.  Follow any diet instructions that you were given by your health care provider.  Keep all follow-up visits as told by your health care provider. This is important. Contact a health care provider if:  You develop new bleeding anywhere in the body. Get help right away if:  You are very weak.  You are short of breath.  You have pain in your abdomen or chest.  You are dizzy or feel faint.  You have trouble concentrating.  You have bloody stools, black stools, or tarry stools.  You vomit repeatedly or you vomit up blood. These symptoms may represent a serious problem that is an emergency. Do not wait to see if the symptoms will go away. Get medical help right away. Call your local emergency services (911 in the [place] Do not  drive yourself to the hospital. Summary  Anemia is a condition in which you do not have enough red blood cells or enough of a substance in your red blood cells that carries oxygen (hemoglobin).  Symptoms may occur suddenly or develop slowly.  If your anemia is mild, you may not have symptoms.  This condition is  diagnosed with blood tests, a medical history, and a physical exam. Other tests may be needed.  Treatment for this condition depends on the cause of the anemia. This information is not intended to replace advice given to you by your health care provider. Make sure you discuss any questions you have with your health care provider. Document Revised: 02/10/2019 Document Reviewed: 02/10/2019 Elsevier Patient Education  2021 Elsevier Inc.  

## 2020-04-18 ENCOUNTER — Other Ambulatory Visit: Payer: Self-pay | Admitting: Family Medicine

## 2020-04-18 DIAGNOSIS — G4700 Insomnia, unspecified: Secondary | ICD-10-CM

## 2020-04-18 NOTE — Telephone Encounter (Signed)
Requesting: Ambien 5mg  Contract: 11/06/2018 UDS: None seen recently Last Visit: 08/07/2019 Next Visit: None Last Refill: 03/16/2020 #30 and 0RF  Please Advise

## 2020-04-19 ENCOUNTER — Other Ambulatory Visit: Payer: Self-pay | Admitting: Family Medicine

## 2020-04-19 DIAGNOSIS — R52 Pain, unspecified: Secondary | ICD-10-CM

## 2020-04-19 MED ORDER — OXYCODONE-ACETAMINOPHEN 10-325 MG PO TABS: 1.0000 | ORAL_TABLET | ORAL | 0 refills | Status: DC | PRN

## 2020-04-19 NOTE — Telephone Encounter (Signed)
Medication: oxyCODONE-acetaminophen (PERCOCET) 10-325 MG tablet [[id number]]       Has the patient contacted their pharmacy?  (If no, request that the patient contact the pharmacy for the refill.) (If yes, when and what did the pharmacy advise?)     Preferred Pharmacy (with phone number or street name): [facility], [address] [place], [place] [address]  Phone:  [phone] Fax:  [phone]      Agent: Please be advised that RX refills may take up to 3 business days. We ask that you follow-up with your pharmacy.

## 2020-04-19 NOTE — Telephone Encounter (Signed)
Per [place],  Pt needs a f/u visit please

## 2020-04-19 NOTE — Telephone Encounter (Signed)
Requesting: Percocet Contract: 11/06/2018 UDS: 11/06/18 Last OV: 08/07/2019 Next OV: N/A Last Refill:  03/24/2020, #120--0 RF Database:   Please advise

## 2020-04-19 NOTE — Telephone Encounter (Signed)
I will refill but we really need to be seeing pt either virtually or in person q3 months who are on opioids

## 2020-04-21 NOTE — Telephone Encounter (Signed)
Appt scheduled 04/25/20.

## 2020-04-25 ENCOUNTER — Encounter: Payer: Self-pay | Admitting: Family Medicine

## 2020-04-25 ENCOUNTER — Ambulatory Visit (INDEPENDENT_AMBULATORY_CARE_PROVIDER_SITE_OTHER): Payer: HMO | Admitting: Family Medicine

## 2020-04-25 ENCOUNTER — Other Ambulatory Visit: Payer: Self-pay

## 2020-04-25 VITALS — BP 140/80 | HR 92 | Temp 98.1°F | Resp 16 | Ht 60.0 in | Wt 84.4 lb

## 2020-04-25 DIAGNOSIS — M797 Fibromyalgia: Secondary | ICD-10-CM

## 2020-04-25 DIAGNOSIS — G894 Chronic pain syndrome: Secondary | ICD-10-CM | POA: Diagnosis not present

## 2020-04-25 DIAGNOSIS — M159 Polyosteoarthritis, unspecified: Secondary | ICD-10-CM

## 2020-04-25 DIAGNOSIS — M5136 Other intervertebral disc degeneration, lumbar region: Secondary | ICD-10-CM

## 2020-04-25 DIAGNOSIS — Z79899 Other long term (current) drug therapy: Secondary | ICD-10-CM | POA: Diagnosis not present

## 2020-04-25 DIAGNOSIS — M5126 Other intervertebral disc displacement, lumbar region: Secondary | ICD-10-CM

## 2020-04-25 NOTE — Patient Instructions (Signed)

## 2020-04-25 NOTE — Assessment & Plan Note (Signed)
Database reviewed Refills utd New contract/ uds done  F/u 3 months

## 2020-04-25 NOTE — Progress Notes (Signed)
Patient ID: [name], female    DOB: Aug 15, 1945  Age: 75 y.o. MRN: [id number]    Subjective:  Subjective  HPI [name] presents for f/u chronic pain meds for fibromyalgia, oa and chronic back pain   Review of Systems  Constitutional: Negative for appetite change, diaphoresis, fatigue and unexpected weight change.  Eyes: Negative for pain, redness and visual disturbance.  Respiratory: Negative for cough, chest tightness, shortness of breath and wheezing.   Cardiovascular: Negative for chest pain, palpitations and leg swelling.  Endocrine: Negative for cold intolerance, heat intolerance, polydipsia, polyphagia and polyuria.  Genitourinary: Negative for difficulty urinating, dysuria and frequency.  Neurological: Negative for dizziness, light-headedness, numbness and headaches.    History Past Medical History:  Diagnosis Date  . Anemia   . Arthritis   . Atrial fibrillation ([facility])   . CAD (coronary artery disease)    Mild disease per cath in 2011  . COPD (chronic obstructive pulmonary disease) ([facility])    Home O2  . Diverticulitis   . Elbow fracture, left aug 2012  . Erythropoietin deficiency anemia 06/19/2018  . Essential hypertension   . Fibromyalgia   . Gastroparesis   . GERD (gastroesophageal reflux disease)   . H/O: GI bleed    from Pradaxa  . Hepatitis    Hx of in high school   . History of pneumonia    Recurrent  . Hx MRSA infection   . Hx of gastric ulcer   . Hx of stress fx 10/2009   Right hip   . Iron malabsorption 08/14/2017  . LBBB (left bundle branch block)   . Oxygen dependent    2 liters via nasal cannula at all times  . Pacemaker    CRT therapy; followed by Dr. Caryl Comes  . Pleural effusion     s/p right thoracentesis 03/09  . Primary dilated cardiomyopathy ([facility])    EF 45 to 50% per echo in Jan 2012  . RLS (restless legs syndrome)   . Silent aspiration   . Small bowel obstruction ([facility])   . Urinary incontinence   . Venous embolism and thrombosis  of subclavian vein [facility])    After pacemaker insertion Oct 2011  . Vitamin B12 deficiency   . Wears glasses    Reading    She has a past surgical history that includes Appendectomy; pacemaker placement (12/21/2009); Peg removed; Tonsillectomy (age 69); Interstim implant revision (03/06/2011); Portacath placement (12/16/2009); Total abdominal hysterectomy; Interstim Implant placement (05/28/2006 - stage I); Interstim implant revision (10/23/2007); Cystoscopy with injection (04/30/2006); Small intestine surgery (05/20/2001); Cardiac catheterization (04/08/2006, 11/16/2009); Cystoscopy (11/11/2008); Cystoscopy w/ retrogrades (11/11/2008); Colectomy; Colectomy (02/04/2000); Gastrocutaneous fistula closure; Exploratory laparotomy (04/27/2009); Port-a-cath removal (07/27/2011); Total hip arthroplasty (08/03/2011); Interstim Implant placement (02/05/2012); Esophagogastroduodenoscopy (N/A, 02/02/2013); Savory dilation (N/A, 02/02/2013); ERCP (N/A, 06/23/2017); ERCP (N/A, 06/24/2017); Cholecystectomy (N/A, 06/27/2017); IR IMAGING GUIDED PORT INSERTION (10/14/2017); Video bronchoscopy (N/A, 05/25/2019); Bronchial washings (05/25/2019); BIV PACEMAKER GENERATOR CHANGEOUT (N/A, 06/19/2019); and TEE without cardioversion (N/A, 07/17/2019).   Her family history includes Breast cancer in her maternal aunt; Diabetes in her paternal grandfather; Heart disease in her brother, father, mother, and paternal aunt; Stroke in her mother.She reports that she quit smoking about 34 years ago. She has a 20.00 pack-year smoking history. She has never used smokeless tobacco. She reports that she does not drink alcohol and does not use drugs.  Current Outpatient Medications on File Prior to Visit  Medication Sig Dispense Refill  . acetaminophen (TYLENOL) 650 MG CR tablet  Take 650 mg by mouth every 8 (eight) hours as needed for pain.    [name] albuterol (VENTOLIN HFA) 108 (90 Base) MCG/ACT inhaler Inhale 2 puffs into the lungs every 6 (six) hours as needed for wheezing  or shortness of breath.    [name] amLODipine (NORVASC) 2.5 MG tablet TAKE 1 TABLET BY MOUTH AT BEDTIME 90 tablet 0  . atorvastatin (LIPITOR) 20 MG tablet TAKE 1 TABLET BY MOUTH ONCE DAILY AT  6PM (Patient taking differently: Take 20 mg by mouth every evening.) 90 tablet 1  . azithromycin (ZITHROMAX) 500 MG tablet Take 500 mg by mouth 3 (three) times a week.    [name] ELIQUIS 5 MG TABS tablet Take 1 tablet by mouth twice daily 180 tablet 1  . ethambutol (MYAMBUTOL) 100 MG tablet Take by mouth.    . ethambutol (MYAMBUTOL) 400 MG tablet Take by mouth.    . furosemide (LASIX) 40 MG tablet TAKE 1 TABLET BY MOUTH IN THE MORNING 90 tablet 0  . gabapentin (NEURONTIN) 600 MG tablet 1 po qid 120 tablet 5  . lidocaine-prilocaine (EMLA) cream Apply 1 application topically as needed. (Patient taking differently: Apply 1 application topically as needed (for port access- to numb).) 30 g 6  . nitrofurantoin (MACRODANTIN) 100 MG capsule Take 100 mg by mouth daily.    . nitroGLYCERIN (NITROSTAT) 0.4 MG SL tablet Place 1 tablet (0.4 mg total) under the tongue every 5 (five) minutes as needed. CHEST PAIN 25 tablet 6  . oxyCODONE-acetaminophen (PERCOCET) 10-325 MG tablet Take 1 tablet by mouth every 4 (four) hours as needed. PAIN 120 tablet 0  . OXYGEN Inhale 3 L/min into the lungs continuous.     . pantoprazole (PROTONIX) 40 MG tablet Take 1 tablet by mouth twice daily 180 tablet 0  . rifabutin (MYCOBUTIN) 150 MG capsule SMARTSIG:2 Capsule(s) By Mouth 3 Times a Week    . tiZANidine (ZANAFLEX) 4 MG tablet Take 1 tablet (4 mg total) by mouth 3 (three) times daily as needed for muscle spasms. 90 tablet 3  . triamcinolone ointment (KENALOG) 0.1 % Apply 1 application topically daily as needed (to affected areas- for psoriasis).     [name] umeclidinium bromide (INCRUSE ELLIPTA) 62.5 MCG/INH AEPB Inhale 1 puff into the lungs daily. 30 each 12  . zolpidem (AMBIEN) 5 MG tablet TAKE 1 TABLET BY MOUTH EVERY DAY AT BEDTIME AS NEEDED 30 tablet  0  . denosumab (PROLIA) 60 MG/ML SOSY injection Inject 60 mg into the skin every 6 (six) months.  (Patient not taking: No sig reported)    . [DISCONTINUED] iron dextran complex (INFED) 50 MG/ML injection Please give infed infusion (no test dose needed) over 4 hours per pharmacy calculated dose. Ht: 5'2 Wt: 99 pounds Hgb: 12 1 mL 0   No current facility-administered medications on file prior to visit.     Objective:  Objective  Physical Exam Vitals and nursing note reviewed.  Constitutional:      Appearance: She is well-developed and well-nourished.  HENT:     Head: Normocephalic and atraumatic.  Eyes:     Extraocular Movements: EOM normal.     Conjunctiva/sclera: Conjunctivae normal.  Neck:     Thyroid: No thyromegaly.     Vascular: No carotid bruit or JVD.  Cardiovascular:     Rate and Rhythm: Normal rate and regular rhythm.     Heart sounds: Normal heart sounds. No murmur heard.   Pulmonary:     Effort: Pulmonary effort is normal. No  respiratory distress.     Breath sounds: Normal breath sounds. No wheezing or rales.     Comments: On 3 L O2 via [place] Chest:     Chest wall: No tenderness.  Musculoskeletal:        General: No edema.     Cervical back: Normal range of motion and neck supple.  Neurological:     Mental Status: She is alert and oriented to person, place, and time.  Psychiatric:        Mood and Affect: Mood and affect normal.    BP 140/80 (BP Location: Right Arm, Patient Position: Sitting, Cuff Size: Small)   Pulse 92   Temp 98.1 F (36.7 C) (Oral)   Resp 16   Ht 5' (1.524 m)   Wt 84 lb 6.4 oz (38.3 kg)   SpO2 98%   BMI 16.48 kg/m  Wt Readings from Last 3 Encounters:  04/25/20 84 lb 6.4 oz (38.3 kg)  03/21/20 88 lb (39.9 kg)  02/09/20 90 lb 9.6 oz (41.1 kg)     Lab Results  Component Value Date   WBC 7.4 04/11/2020   HGB 10.2 (L) 04/11/2020   HCT 31.8 (L) 04/11/2020   PLT 277 04/11/2020   GLUCOSE 104 (H) 04/11/2020   CHOL 102 05/11/2019    TRIG 97 05/11/2019   HDL 29 (L) 05/11/2019   LDLCALC 54 05/11/2019   ALT 6 04/11/2020   AST 15 04/11/2020   NA 138 04/11/2020   K 4.1 04/11/2020   CL 99 04/11/2020   CREATININE 0.79 04/11/2020   BUN 17 04/11/2020   CO2 30 04/11/2020   TSH 7.708 (H) 07/20/2019   INR 1.1 07/15/2019   HGBA1C 5.2 05/31/2017    US Abdomen Limited RUQ  Result Date: 12/24/2019 CLINICAL DATA:  Right upper quadrant pain EXAM: ULTRASOUND ABDOMEN LIMITED RIGHT UPPER QUADRANT COMPARISON:  Abdominal ultrasound 06/20/2017 FINDINGS: Gallbladder: Surgically absent. Common bile duct: Diameter: 0.9 cm, within normal limits given history of cholecystectomy. Liver: No focal lesion identified. Within normal limits in parenchymal echogenicity. Portal vein is patent on color Doppler imaging with normal direction of blood flow towards the liver. Other: Mild intrahepatic biliary duct dilation. IMPRESSION: 1. No definite acute findings to explain the patient's right upper quadrant pain. 2. There is mild intrahepatic biliary duct dilation which may be secondary to patient's cholecystectomy. Electronically Signed   By: Audie Pinto M.D.   On: 12/24/2019 16:15     Assessment & Plan:  Plan  I am having [name] maintain her denosumab, lidocaine-prilocaine, triamcinolone ointment, acetaminophen, atorvastatin, OXYGEN, nitroGLYCERIN, albuterol, nitrofurantoin, Eliquis, ethambutol, ethambutol, rifabutin, azithromycin, tiZANidine, pantoprazole, Incruse Ellipta, gabapentin, furosemide, amLODipine, zolpidem, and oxyCODONE-acetaminophen.  No orders of the defined types were placed in this encounter.   Problem List Items Addressed This Visit      Unprioritized   Fibromyalgia - Primary    Other Visit Diagnoses    Osteoarthritis of multiple joints, unspecified osteoarthritis type       Bulging lumbar disc          Follow-up: Return in about 3 months (around 07/23/2020).  Ann Held, DO

## 2020-04-26 NOTE — Addendum Note (Signed)
Addended by: Sanda Linger on: 04/26/2020 09:23 AM   Modules accepted: Orders

## 2020-04-27 ENCOUNTER — Other Ambulatory Visit: Payer: Self-pay | Admitting: Internal Medicine

## 2020-04-28 LAB — DRUG MONITORING, PANEL 8 WITH CONFIRMATION, URINE
6 Acetylmorphine: NEGATIVE ng/mL (ref <10)
Alcohol Metabolites: NEGATIVE ng/mL
Amphetamines: NEGATIVE ng/mL (ref <500)
Benzodiazepines: NEGATIVE ng/mL (ref <100)
Buprenorphine, Urine: NEGATIVE ng/mL (ref <5)
Cocaine Metabolite: NEGATIVE ng/mL (ref <150)
Codeine: NEGATIVE ng/mL (ref <50)
Creatinine: 32 mg/dL
Hydrocodone: NEGATIVE ng/mL (ref <50)
Hydromorphone: NEGATIVE ng/mL (ref <50)
MDMA: NEGATIVE ng/mL (ref <500)
Marijuana Metabolite: NEGATIVE ng/mL (ref <20)
Morphine: NEGATIVE ng/mL (ref <50)
Norhydrocodone: NEGATIVE ng/mL (ref <50)
Noroxycodone: 3858 ng/mL — ABNORMAL HIGH (ref <50)
Opiates: NEGATIVE ng/mL (ref <100)
Oxidant: NEGATIVE ug/mL
Oxycodone: 805 ng/mL — ABNORMAL HIGH (ref <50)
Oxycodone: POSITIVE ng/mL — AB (ref <100)
Oxymorphone: 681 ng/mL — ABNORMAL HIGH (ref <50)
pH: 6.7 (ref 4.5–9.0)

## 2020-04-28 LAB — DM TEMPLATE

## 2020-05-02 ENCOUNTER — Inpatient Hospital Stay: Payer: HMO | Admitting: Family

## 2020-05-02 ENCOUNTER — Inpatient Hospital Stay: Payer: HMO

## 2020-05-04 NOTE — Progress Notes (Signed)
Chronic Care Management Pharmacy Note  05/10/2020 Name:  [name] MRN:  [id number] DOB:  04/08/1945  Subjective: [name] is an 75 y.o. year old female who is a primary patient of Ann Held, DO.  The CCM team was consulted for assistance with disease management and care coordination needs.    Engaged with patient by telephone for follow up visit in response to provider referral for pharmacy case management and/or care coordination services.   Consent to Services:  The patient was given the following information about Chronic Care Management services today, agreed to services, and gave verbal consent: 1. CCM service includes personalized support from designated clinical staff supervised by the primary care provider, including individualized plan of care and coordination with other care providers 2. 24/7 contact phone numbers for assistance for urgent and routine care needs. 3. Service will only be billed when office clinical staff spend 20 minutes or more in a month to coordinate care. 4. Only one practitioner may furnish and bill the service in a calendar month. 5.The patient may stop CCM services at any time (effective at the end of the month) by phone call to the office staff. 6. The patient will be responsible for cost sharing (co-pay) of up to 20% of the service fee (after annual deductible is met). Patient agreed to services and consent obtained.  Patient Care Team: Carollee Herter, Alferd Apa, DO as PCP - General Minus Breeding, MD as PCP - Cardiology (Cardiology) Deboraha Sprang, MD as PCP - Electrophysiology (Cardiology) Rolm Bookbinder, MD as Consulting Physician (General Surgery) Lafayette Dragon, MD (Inactive) as Consulting Physician (Gastroenterology) Gatha Mayer, MD as Consulting Physician (Gastroenterology) Rutherford Guys, MD as Consulting Physician (Ophthalmology) Crista Luria, MD as Consulting Physician (Dermatology) Deboraha Sprang, MD as Consulting  Physician (Cardiology) Minus Breeding, MD as Consulting Physician (Cardiology) Edythe Clarity, [facility] (Pharmacist)  Recent office visits: 04/25/2020 Etter Sjogren) -  No med changes, continue regular follow up   Recent consult visits: 02/03/20: Ortho visit w/ Dr. Ninfa Linden - Reviewed Xrays, pt does have rib fracture. Follow up as needed.   12/28/19: Gastro visit w/ Dr. Carlean Purl - Recheck liver test. Clinical scenario compatible with a common bile duct stone. Pt well at visit. No med changes noted.   11/26/19: ID visit w/ Dr. Baxter Flattery - Continue current regimen. RTC 6-8weeks MAI infection abx regimen:  - rifabutin 2 x 150 mg capsules on Tuesdays, Thursdays, and Saturdays - ethambutol 2 x 400 mg tablets + 2 x 100 mg tablets on Mondays, Wednesdays, Fridays - azithromycin 1 x 500 mg tablet on Mondays, Wednesdays, Fridays Resume [facility] visits: None in previous 6 months  Social Hx Husband. 5 children (4 adopted (1 deceased), 1 biological) 2 god-children. 3 grandchildren and oldest Daughter lives in [place], [place]. Brother lives in [place], [place].   Gets her hair done every Friday.   PAP Meds = ~ 1 month supply remaining  Morning meds in ziploc bag PM meds in ziploc bag PRN meds on kitchen counter Takes morning medicine between 7-8am Takes night medicine around 9-10pm   Objective:  Lab Results  Component Value Date   CREATININE 0.79 05/09/2020   BUN 20 05/09/2020   GFR 71.19 08/18/2018   GFRNONAA >60 05/09/2020   GFRAA >60 12/01/2019   NA 138 05/09/2020   K 3.9 05/09/2020   CALCIUM 8.9 05/09/2020   CO2 30 05/09/2020    Lab Results  Component Value  Date/Time   HGBA1C 5.2 05/31/2017 12:46 AM   HGBA1C  08/17/2007 05:00 PM    5.6 (NOTE)   The ADA recommends the following therapeutic goals for glycemic   control related to Hgb A1C measurement:   Goal of Therapy:   < 7.0% Hgb A1C   Action Suggested:  > 8.0% Hgb A1C   Ref:  Diabetes Care, 22, Suppl. 1, 1999   GFR  71.19 08/18/2018 01:08 PM   GFR 77.02 07/10/2017 10:49 AM    Last diabetic Eye exam: No results found for: HMDIABEYEEXA  Last diabetic Foot exam: No results found for: HMDIABFOOTEX   Lab Results  Component Value Date   CHOL 102 05/11/2019   HDL 29 (L) 05/11/2019   LDLCALC 54 05/11/2019   TRIG 97 05/11/2019   CHOLHDL 3.5 05/11/2019    Hepatic Function Latest Ref Rng & Units 05/09/2020 04/11/2020 03/21/2020  Total Protein 6.5 - 8.1 g/dL 7.5 7.3 7.1  Albumin 3.5 - 5.0 g/dL 4.6 4.1 4.1  AST 15 - 41 U/L _0 ALT 0 - 44 U/L _1 Alk Phosphatase 38 - 126 U/L 138(H) 119 143(H)  Total Bilirubin 0.3 - 1.2 mg/dL 0.4 0.4 0.3  Bilirubin, Direct 0.0 - 0.3 mg/dL - - -    Lab Results  Component Value Date/Time   TSH 7.708 (H) 07/20/2019 08:05 PM   TSH 4.43 06/26/2010 01:55 PM   TSH 1.05 02/15/2009 10:23 AM   FREET4 0.68 07/20/2019 08:30 PM    CBC Latest Ref Rng & Units 05/09/2020 04/11/2020 03/21/2020  WBC 4.0 - 10.5 K/uL 6.3 7.4 3.9(L)  Hemoglobin 12.0 - 15.0 g/dL 11.8(L) 10.2(L) 11.4(L)  Hematocrit 36.0 - 46.0 % 37.1 31.8(L) 36.2  Platelets 150 - 400 K/uL 214 277 191    Lab Results  Component Value Date/Time   VD25OH 16.46 (L) 09/06/2015 02:16 PM   VD25OH 4.8 (L) 05/20/2014 03:33 PM   VD25OH 15 (L) 12/12/2011 06:45 PM    Clinical ASCVD: Yes  The ASCVD Risk score Mikey Bussing DC Jr., et al., 2013) failed to calculate for the following reasons:   The valid total cholesterol range is 130 to 320 mg/dL    Depression screen [facility] 2/9 02/09/2020 11/11/2019 09/08/2019  Decreased Interest 0 0 0  Down, Depressed, Hopeless 0 0 0  PHQ - 2 Score 0 0 0  Some recent data might be hidden      Social History   Tobacco Use  Smoking Status Former Smoker  . Packs/day: 1.00  . Years: 20.00  . Pack years: 20.00  . Quit date: 03/19/1986  . Years since quitting: 34.1  Smokeless Tobacco Never Used   BP Readings from Last 3 Encounters:  05/09/20 (!) 161/88  04/25/20 140/80  04/11/20 (!) 146/77    Pulse Readings from Last 3 Encounters:  05/09/20 92  04/25/20 92  04/11/20 85   Wt Readings from Last 3 Encounters:  05/09/20 87 lb 14.4 oz (39.9 kg)  04/25/20 84 lb 6.4 oz (38.3 kg)  03/21/20 88 lb (39.9 kg)    Assessment/Interventions: Review of patient past medical history, allergies, medications, health status, including review of consultants reports, laboratory and other test data, was performed as part of comprehensive evaluation and provision of chronic care management services.   SDOH:  (Social Determinants of Health) assessments and interventions performed: No   CCM Care Plan  Allergies  Allergen Reactions  . Dabigatran Etexilate Mesylate Other (See Comments)    INTERNAL BLEEDING- Pradaxa  .  Augmentin [Amoxicillin-Pot Clavulanate] Diarrhea and Nausea Only    Did it involve swelling of the face/tongue/throat, SOB, or low BP? No Did it involve sudden or severe rash/hives, skin peeling, or any reaction on the inside of your mouth or nose? No Did you need to seek medical attention at a hospital or doctor's office? No When did it last happen?02/2019 If all above answers are "NO", may proceed with cephalosporin use.   [name] [Pentazocine] Other (See Comments)    Hallucinations     Medications Reviewed Today    Reviewed by Edythe Clarity, [facility] (Pharmacist) on 05/10/20 at 1128  Med List Status: <None>  Medication Order Taking? Sig Documenting Provider Last Dose Status Informant  acetaminophen (TYLENOL) 650 MG CR tablet [id number] Yes Take 650 mg by mouth every 8 (eight) hours as needed for pain. [provider] Taking Active Self  albuterol (VENTOLIN HFA) 108 (90 Base) MCG/ACT inhaler [id number] Yes Inhale 2 puffs into the lungs every 6 (six) hours as needed for wheezing or shortness of breath. [provider] Taking Active Self           Med Note Duffy Bruce, Legrand Como   Wed Jul 15, 2019  9:26 PM) Gets from the [employer] Patient Assistance Program  amLODipine  (NORVASC) 2.5 MG tablet [id number] Yes TAKE 1 TABLET BY MOUTH AT BEDTIME Minus Breeding, MD Taking Active   atorvastatin (LIPITOR) 20 MG tablet [id number] Yes TAKE 1 TABLET BY MOUTH ONCE DAILY AT  6PM  Patient taking differently: Take 20 mg by mouth every evening.   Roma Schanz R, DO Taking Active   azithromycin (ZITHROMAX) 500 MG tablet [id number] Yes Take 500 mg by mouth 3 (three) times a week. [provider] Taking Active   ELIQUIS 5 MG TABS tablet [id number] Yes Take 1 tablet by mouth twice daily Carollee Herter, Alferd Apa, DO Taking Active   ethambutol (MYAMBUTOL) 100 MG tablet [id number] Yes Take by mouth. [provider] Taking Active   ethambutol (MYAMBUTOL) 400 MG tablet [id number] Yes Take by mouth. [provider] Taking Active   furosemide (LASIX) 40 MG tablet [id number] Yes TAKE 1 TABLET BY MOUTH IN THE [name], MD Taking Active   gabapentin (NEURONTIN) 600 MG tablet [id number] Yes 1 po qid Ann Held, DO Taking Active         Discontinued 06/05/11 1433 (Error)   lidocaine-prilocaine (EMLA) cream [id number] Yes Apply 1 application topically as needed.  Patient taking differently: Apply 1 application topically as needed (for port access- to numb).   Volanda Napoleon, MD Taking Active   nitrofurantoin (MACRODANTIN) 100 MG capsule [id number] Yes Take 100 mg by mouth daily. [provider] Taking Active   nitroGLYCERIN (NITROSTAT) 0.4 MG SL tablet [id number] Yes Place 1 tablet (0.4 mg total) under the tongue every 5 (five) minutes as needed. CHEST PAIN Minus Breeding, MD Taking Active Self  oxyCODONE-acetaminophen (PERCOCET) 10-325 MG tablet [id number] Yes Take 1 tablet by mouth every 4 (four) hours as needed. PAIN Ann Held, DO Taking Active   OXYGEN [id number] Yes Inhale 3 L/min into the lungs continuous.  [provider] Taking Active Self  pantoprazole (PROTONIX) 40 MG tablet [id number] Yes Take 1 tablet by  mouth twice daily Gatha Mayer, MD Taking Active   rifabutin [facility]) 150 MG capsule [id number] Yes SMARTSIG:2 Capsule(s) By Mouth 3 Times a Week [provider] Taking Active   tiZANidine (ZANAFLEX) 4 MG tablet [id number] Yes Take 1  tablet (4 mg total) by mouth 3 (three) times daily as needed for muscle spasms. Ann Held, DO Taking Active   triamcinolone ointment (KENALOG) 0.1 % [id number] Yes Apply 1 application topically daily as needed (to affected areas- for psoriasis).  [provider] Taking Active Self  umeclidinium bromide (INCRUSE ELLIPTA) 62.5 MCG/INH AEPB [id number] Yes Inhale 1 puff into the lungs daily. Lauraine Rinne, NP Taking Active   zolpidem (AMBIEN) 5 MG tablet [id number] Yes TAKE 1 TABLET BY MOUTH EVERY DAY AT BEDTIME AS NEEDED Ann Held, DO Taking Active           Patient Active Problem List   Diagnosis Date Noted  . Hematoma 08/07/2019  . Other neutropenia ([place])   . Post-procedural fever 07/20/2019  . Acute febrile illness 07/16/2019  . Pulmonary MAI (mycobacterium avium-intracellulare) infection ([place]) 06/24/2019  . History of total right hip replacement 05/27/2019  . NICM (nonischemic cardiomyopathy) ([place]) 05/19/2019  . Educated about COVID-19 virus infection 09/07/2018  . Erythropoietin deficiency anemia 06/19/2018  . Swelling of calf 02/04/2018  . Bad odor of urine 12/09/2017  . Iron deficiency anemia due to chronic blood loss 08/14/2017  . Iron malabsorption 08/14/2017  . Hyperlipidemia LDL goal <100 07/08/2017  . Elevated ALT measurement   . Hypotension 06/20/2017  . HLD (hyperlipidemia) 06/20/2017  . CAD (coronary artery disease) 06/20/2017  . Protein-calorie malnutrition, severe ([place]) 06/20/2017  . Generalized weakness   . Weakness 05/30/2017  . Focal neurological deficit 05/30/2017  . Chronic diastolic CHF (congestive heart failure) ([place]) 05/30/2017  . TIA (transient ischemic attack) 05/30/2017  . AKI (acute  kidney injury) ([place]) 05/30/2017  . Abnormal LFTs 05/30/2017  . Bronchiectasis ([place]) 04/22/2017  . Right otitis media 07/17/2016  . Cerumen impaction 07/17/2016  . Osteoporosis 03/16/2016  . Headache 11/20/2014  . Skin infection 05/27/2014  . Insomnia 05/27/2014  . Abdominal wall pain 07/09/2013  . Other dysphagia 02/02/2013  . Platelets decreased ([place]) 01/29/2012  . Vitamin D deficiency 12/13/2011  . Hypokalemia 12/13/2011  . Hypocalcemia 12/12/2011  . Adhesions of intestine, very dense, with frozen abdomen 12/11/2011  . SBO (small bowel obstruction), recurrent 12/11/2011  . Protein-calorie malnutrition, moderate ([place]) 12/11/2011  . Oxygen dependent   . Hx of gastric ulcer   . UTI (urinary tract infection) 10/15/2011  . Allergic conjunctivitis 10/15/2011  . Degenerative arthritis of hip 08/03/2011  . B12 deficiency 07/02/2011  . Bilateral leg weakness 01/16/2011  . Anemia, chronic disease 09/14/2010  . Recurrent pneumonia 08/11/2010  . CRT- [employer] 06/26/2010  . DYSURIA 05/19/2010  . Chronic hypoxemic respiratory failure ([place]) 02/21/2010  . DEHYDRATION 01/06/2010  . GASTROENTERITIS 01/06/2010  . Congestive dilated cardiomyopathy ([place]) 11/17/2009  . LBBB 11/17/2009  . SYSTOLIC HEART FAILURE, CHRONIC 11/17/2009  . GERD (gastroesophageal reflux disease) 02/17/2009  . NONSPECIFIC ABN FINDNG RAD&OTH EXAM BILARY TRCT 09/03/2008  . COPD with emphysema ([place]) 06/28/2008  . DIVERTICULOSIS, COLON 06/03/2008  . GASTRITIS 06/02/2008  . Atrial fibrillation/flutter 12/08/2007  . GASTROPARESIS 08/07/2007  . NAUSEA, CHRONIC 08/07/2007  . Abdominal pain 08/07/2007  . SYNCOPE 02/17/2007  . RESTLESS LEG SYNDROME 08/01/2006  . Chronic pain syndrome 08/01/2006  . OSTEOARTHRITIS 07/31/2006  . Degenerative disc disease, lumbar 07/31/2006  . Fibromyalgia 07/31/2006  . CHRONIC FATIGUE SYNDROME 07/31/2006  . URINARY INCONTINENCE 07/31/2006  . ANEMIA-IRON DEFICIENCY 05/01/2006  . Essential  hypertension 05/01/2006    Immunization History  Administered Date(s) Administered  . DTaP 11/03/1996, 01/04/1997, 05/03/1997, 10/19/1997, 08/05/2001  .  Fluad Quad(high Dose 65+) 11/06/2018, 12/31/2019  . HPV Quadrivalent 09/05/2009, 09/13/2010, 09/26/2011  . Hepatitis A, Ped/Adol-2 Dose 09/21/2004, 03/29/2005  . Hepatitis B, ped/adol 04/30/1996, 11/03/1996, 10/19/1997  . HiB (PRP-T) 11/03/1996, 01/04/1997, 05/03/1997, 10/19/1997  . IPV 11/03/1996, 01/04/1997, 05/03/1997, 08/05/2001  . Influenza Split 01/16/2011, 12/12/2011  . Influenza Whole 01/15/2007, 12/08/2007, 12/28/2008, 12/17/2009  . Influenza, High Dose Seasonal PF 11/28/2015, 12/07/2016, 11/15/2017  . Influenza, Seasonal, Injecte, Preservative Fre 12/09/2012  . Influenza,Quad,Nasal, Live 03/17/2007, 03/06/2009, 01/08/2010, 01/30/2011, 02/09/2012, 01/18/2013  . Influenza,inj,Quad PF,6+ Mos 03/29/2005, 04/30/2005, 03/01/2006, 01/28/2008, 12/01/2013, 01/16/2014, 12/22/2014, 12/28/2014  . MMR 05/03/1997, 08/05/2001  . Meningococcal B, OMV 12/22/2014  . Pneumococcal Conjugate-13 06/10/2014, 11/15/2014  . Pneumococcal Polysaccharide-23 12/17/2009  . Tdap 09/17/2007, 05/15/2014, 12/09/2018  . Varicella 11/16/1997, 05/18/2008  . Zoster 12/31/2013    Conditions to be addressed/monitored:  Afib, COPD, HF, HTN, HLD, CAD, Chronic Pain Syndrome, GERD, Insomnia   Care Plan : General Pharmacy (Adult)  Updates made by Edythe Clarity, RPH since 05/10/2020 12:00 AM    Problem: Afib, COPD, HF, HTN, HLD, CAD, Chronic Pain Syndrome, GERD, Insomnia   Priority: High  Onset Date: 05/10/2020    Long-Range Goal: Patient-Specific Goal   Start Date: 05/10/2020  Expected End Date: 08/07/2020  This Visit's Progress: On track  Priority: High  Note:   Current Barriers:  . Unable to independently afford treatment regimen   Pharmacist Clinical Goal(s):  [name] Over the next 90 days, patient will verbalize ability to afford treatment  regimen . achieve adherence to monitoring guidelines and medication adherence to achieve therapeutic efficacy . contact provider office for questions/concerns as evidenced notation of same in electronic health record through collaboration with PharmD and provider.    Interventions: . 1:1 collaboration with Carollee Herter, Alferd Apa, DO regarding development and update of comprehensive plan of care as evidenced by provider attestation and co-signature . Inter-disciplinary care team collaboration (see longitudinal plan of care) . Comprehensive medication review performed; medication list updated in electronic medical record  Hypertension (BP goal <140/90) -controlled -Current treatment:  Amlodipine 2.33m daily PM -Medications previously tried: none noted  -Current home readings: checks infrequently, noted BP usually < 140/80  -Denies hypotensive/hypertensive symptoms -Educated on BP goals and benefits of medications for prevention of heart attack, stroke and kidney damage; Importance of home blood pressure monitoring; -Counseled to monitor BP at home periodically, document, and provide log at future appointments -Recommended to continue current medication  Atrial Fibrillation (Goal: prevent stroke and major bleeding) -controlled  -Current treatment:  Anticoagulation: Eliquis 578mtwice daily (per Dr. LoEtter Sjogren-Medications previously tried: amiodarone, carvedilol -Home BP and HR readings: none noted  -Counseled on increased risk of stroke due to Afib and benefits of anticoagulation for stroke prevention; bleeding risk associated with Eliquis and importance of self-monitoring for signs/symptoms of bleeding; Most recent Hgb was close to lower range of normal, trending up recently -Recommended to continue current medication  -Patient was on PAP in 2021, has not applied this year will send paperwork she can send off after 4% OOP has been met.  COPD (Goal: control symptoms and prevent  exacerbations) -controlled -Current treatment   Incruse ellipta 62.16m88mnh once daily (per Sood)  Proventil (per SooOxbowMedications previously tried: none noted  -Gold Grade: Gold 2 (FEV1 50-79%) -Current COPD Classification:  B (high sx, <2 exacerbations/yr) -Pulmonary function testing: 10/10/2011-pulmonary function test-FVC 1.47 (54% predicted), postbronchodilator ratio 64, postbronchodilator FEV1 1.15 (59% predicted), positive bronchodilator response, DLCO 5.3 (35% predicted) -Exacerbations requiring treatment in  last 6 months: none -Patient reports consistent use of maintenance inhaler -Frequency of rescue inhaler use: prn -Counseled on Proper inhaler technique; Benefits of consistent maintenance inhaler use -Recommended to continue current medication Assessed patient finances. She was receiving assistance with Incruse in 2021.  Will mail patient paperwork for incruse for her to re apply for 2022.  Pain (Goal: Minimize symptoms) -controlled -Current treatment   Gabapentin 619m 4 times daily  Oxycodone 10-3291m4 times daily  Tizanidine 60m76mID (only takes BID)  Acetaminophen 325 2 tabs PRN - uses every other day -Medications previously tried: none noted -Patient reports pain currently controlled as it can be, she recovered well from broken rib -She will call in the refills she needs early next week -Recommended to continue current medication   Patient Goals/Self-Care Activities . Over the next 90 days, patient will:  - take medications as prescribed check blood pressure periodically, document, and provide at future appointments collaborate with provider on medication access solutions  Follow Up Plan: The care management team will reach out to the patient again over the next 90 days.         Medication Assistance: Application for Eliquis and Incruse Ellipta  medication assistance program. in process.  Anticipated assistance start date March 2022.  See plan of care  for additional detail.  [facility] [id number]: [phone]: [phone] pill box? No - has meds split in ziploc bags Pt endorses 100% compliance  We discussed: Benefits of medication synchronization, packaging and delivery as well as enhanced pharmacist oversight with [employer]. Patient decided to: Continue current medication management strategy  Care Plan and Follow Up Patient Decision:  Patient agrees to Care Plan and Follow-up.  Plan: The care management team will reach out to the patient again over the next 90 days.  ChrBeverly MilchharmD Clinical Pharmacist [phone]

## 2020-05-09 ENCOUNTER — Telehealth: Payer: Self-pay

## 2020-05-09 ENCOUNTER — Inpatient Hospital Stay: Payer: HMO | Attending: Family

## 2020-05-09 ENCOUNTER — Other Ambulatory Visit: Payer: Self-pay

## 2020-05-09 ENCOUNTER — Inpatient Hospital Stay (HOSPITAL_BASED_OUTPATIENT_CLINIC_OR_DEPARTMENT_OTHER): Payer: HMO | Admitting: Family

## 2020-05-09 ENCOUNTER — Inpatient Hospital Stay: Payer: HMO

## 2020-05-09 ENCOUNTER — Encounter: Payer: Self-pay | Admitting: Family

## 2020-05-09 VITALS — BP 161/88 | HR 92 | Temp 98.1°F | Resp 18 | Ht 60.0 in | Wt 87.9 lb

## 2020-05-09 DIAGNOSIS — Z79899 Other long term (current) drug therapy: Secondary | ICD-10-CM | POA: Insufficient documentation

## 2020-05-09 DIAGNOSIS — D631 Anemia in chronic kidney disease: Secondary | ICD-10-CM | POA: Insufficient documentation

## 2020-05-09 DIAGNOSIS — D508 Other iron deficiency anemias: Secondary | ICD-10-CM | POA: Insufficient documentation

## 2020-05-09 DIAGNOSIS — N189 Chronic kidney disease, unspecified: Secondary | ICD-10-CM | POA: Insufficient documentation

## 2020-05-09 DIAGNOSIS — D500 Iron deficiency anemia secondary to blood loss (chronic): Secondary | ICD-10-CM

## 2020-05-09 DIAGNOSIS — Z95828 Presence of other vascular implants and grafts: Secondary | ICD-10-CM

## 2020-05-09 DIAGNOSIS — K909 Intestinal malabsorption, unspecified: Secondary | ICD-10-CM | POA: Insufficient documentation

## 2020-05-09 LAB — RETICULOCYTES
Immature Retic Fract: 2.9 % (ref 2.3–15.9)
RBC.: 4.04 MIL/uL (ref 3.87–5.11)
Retic Count, Absolute: 16.6 10*3/uL — ABNORMAL LOW (ref 19.0–186.0)
Retic Ct Pct: 0.4 % (ref 0.4–3.1)

## 2020-05-09 LAB — CBC WITH DIFFERENTIAL (CANCER CENTER ONLY)
Abs Immature Granulocytes: 0.02 10*3/uL (ref 0.00–0.07)
Basophils Absolute: 0 10*3/uL (ref 0.0–0.1)
Basophils Relative: 1 %
Eosinophils Absolute: 0.2 10*3/uL (ref 0.0–0.5)
Eosinophils Relative: 2 %
HCT: 37.1 % (ref 36.0–46.0)
Hemoglobin: 11.8 g/dL — ABNORMAL LOW (ref 12.0–15.0)
Immature Granulocytes: 0 %
Lymphocytes Relative: 6 %
Lymphs Abs: 0.4 10*3/uL — ABNORMAL LOW (ref 0.7–4.0)
MCH: 28.9 pg (ref 26.0–34.0)
MCHC: 31.8 g/dL (ref 30.0–36.0)
MCV: 90.9 fL (ref 80.0–100.0)
Monocytes Absolute: 0.7 10*3/uL (ref 0.1–1.0)
Monocytes Relative: 11 %
Neutro Abs: 5 10*3/uL (ref 1.7–7.7)
Neutrophils Relative %: 80 %
Platelet Count: 214 10*3/uL (ref 150–400)
RBC: 4.08 MIL/uL (ref 3.87–5.11)
RDW: 14.4 % (ref 11.5–15.5)
WBC Count: 6.3 10*3/uL (ref 4.0–10.5)
nRBC: 0 % (ref 0.0–0.2)

## 2020-05-09 LAB — CMP (CANCER CENTER ONLY)
ALT: 15 U/L (ref 0–44)
AST: 19 U/L (ref 15–41)
Albumin: 4.6 g/dL (ref 3.5–5.0)
Alkaline Phosphatase: 138 U/L — ABNORMAL HIGH (ref 38–126)
Anion gap: 8 (ref 5–15)
BUN: 20 mg/dL (ref 8–23)
CO2: 30 mmol/L (ref 22–32)
Calcium: 8.9 mg/dL (ref 8.9–10.3)
Chloride: 100 mmol/L (ref 98–111)
Creatinine: 0.79 mg/dL (ref 0.44–1.00)
GFR, Estimated: >60 mL/min (ref >60)
Glucose, Bld: 101 mg/dL — ABNORMAL HIGH (ref 70–99)
Potassium: 3.9 mmol/L (ref 3.5–5.1)
Sodium: 138 mmol/L (ref 135–145)
Total Bilirubin: 0.4 mg/dL (ref 0.3–1.2)
Total Protein: 7.5 g/dL (ref 6.5–8.1)

## 2020-05-09 MED ORDER — HEPARIN SOD (PORK) LOCK FLUSH 100 UNIT/ML IV SOLN
500.0000 [IU] | Freq: Once | INTRAVENOUS | Status: AC
  Administered 2020-05-09: 500 [IU] via INTRAVENOUS
  Filled 2020-05-09: qty 5

## 2020-05-09 MED ORDER — SODIUM CHLORIDE 0.9% FLUSH
10.0000 mL | Freq: Once | INTRAVENOUS | Status: AC
  Administered 2020-05-09: 10 mL via INTRAVENOUS
  Filled 2020-05-09: qty 10

## 2020-05-09 NOTE — Progress Notes (Signed)
Hematology and Oncology Follow Up Visit  [name] [id number] Aug 09, 1945 75 y.o. 05/09/2020   Principle Diagnosis:  Iron deficiency anemia Erythropoietin deficient anemia  Current Therapy: IV iron as indicated Retacrit 40,000 units SQfor hgb <11   Interim History:  [name] is here today for follow-up. Hgb is 11.8! No ESA needed at this time.  She has mild fatigue at times.  SOB stable on supplemental O2 24 hours a day.  She has not noted any blood loss. No bruising or petechiae.  No fever, chills, n/v, cough, rash, dizziness, chest pain, palpitations, abdominal pain or changes in bowel or bladder habits.  No swelling or tenderness in her extremities.  Neuropathy in her hands is stable/unchanged.  She has maintained a good appetite and is staying well hydrated. Her weight is stable at 87 lbs.   ECOG Performance Status: 1 - Symptomatic but completely ambulatory  Medications:  Allergies as of 05/09/2020      Reactions   Dabigatran Etexilate Mesylate Other (See Comments)   INTERNAL BLEEDING- Pradaxa   Augmentin [amoxicillin-pot Clavulanate] Diarrhea, Nausea Only   Did it involve swelling of the face/tongue/throat, SOB, or low BP? No Did it involve sudden or severe rash/hives, skin peeling, or any reaction on the inside of your mouth or nose? No Did you need to seek medical attention at a hospital or doctor's office? No When did it last happen?02/2019 If all above answers are "NO", may proceed with cephalosporin use.   Talwin [pentazocine] Other (See Comments)   Hallucinations      Medication List       Accurate as of May 09, 2020 11:51 AM. If you have any questions, ask your nurse or doctor.        acetaminophen 650 MG CR tablet Commonly known as: TYLENOL Take 650 mg by mouth every 8 (eight) hours as needed for pain.   albuterol 108 (90 Base) MCG/ACT inhaler Commonly known as: VENTOLIN HFA Inhale 2 puffs into the lungs every 6 (six) hours as  needed for wheezing or shortness of breath.   amLODipine 2.5 MG tablet Commonly known as: NORVASC TAKE 1 TABLET BY MOUTH AT BEDTIME   atorvastatin 20 MG tablet Commonly known as: LIPITOR TAKE 1 TABLET BY MOUTH ONCE DAILY AT  6PM What changed:   how much to take  how to take this  when to take this  additional instructions   azithromycin 500 MG tablet Commonly known as: ZITHROMAX Take 500 mg by mouth 3 (three) times a week.   Eliquis 5 MG Tabs tablet Generic drug: apixaban Take 1 tablet by mouth twice daily   ethambutol 100 MG tablet Commonly known as: MYAMBUTOL Take by mouth.   ethambutol 400 MG tablet Commonly known as: MYAMBUTOL Take by mouth.   furosemide 40 MG tablet Commonly known as: LASIX TAKE 1 TABLET BY MOUTH IN THE MORNING   gabapentin 600 MG tablet Commonly known as: NEURONTIN 1 po qid   Incruse Ellipta 62.5 MCG/INH Aepb Generic drug: umeclidinium bromide Inhale 1 puff into the lungs daily.   lidocaine-prilocaine cream Commonly known as: EMLA Apply 1 application topically as needed. What changed: reasons to take this   nitrofurantoin 100 MG capsule Commonly known as: MACRODANTIN Take 100 mg by mouth daily.   nitroGLYCERIN 0.4 MG SL tablet Commonly known as: NITROSTAT Place 1 tablet (0.4 mg total) under the tongue every 5 (five) minutes as needed. CHEST PAIN   oxyCODONE-acetaminophen 10-325 MG tablet Commonly known as: PERCOCET Take  1 tablet by mouth every 4 (four) hours as needed. PAIN   OXYGEN Inhale 3 L/min into the lungs continuous.   pantoprazole 40 MG tablet Commonly known as: PROTONIX Take 1 tablet by mouth twice daily   rifabutin 150 MG capsule Commonly known as: MYCOBUTIN SMARTSIG:2 Capsule(s) By Mouth 3 Times a Week   tiZANidine 4 MG tablet Commonly known as: ZANAFLEX Take 1 tablet (4 mg total) by mouth 3 (three) times daily as needed for muscle spasms.   triamcinolone ointment 0.1 % Commonly known as: KENALOG Apply  1 application topically daily as needed (to affected areas- for psoriasis).   zolpidem 5 MG tablet Commonly known as: AMBIEN TAKE 1 TABLET BY MOUTH EVERY DAY AT BEDTIME AS NEEDED       Allergies:  Allergies  Allergen Reactions  . Dabigatran Etexilate Mesylate Other (See Comments)    INTERNAL BLEEDING- Pradaxa  . Augmentin [Amoxicillin-Pot Clavulanate] Diarrhea and Nausea Only    Did it involve swelling of the face/tongue/throat, SOB, or low BP? No Did it involve sudden or severe rash/hives, skin peeling, or any reaction on the inside of your mouth or nose? No Did you need to seek medical attention at a hospital or doctor's office? No When did it last happen?02/2019 If all above answers are "NO", may proceed with cephalosporin use.   [name] [Pentazocine] Other (See Comments)    Hallucinations     Past Medical History, Surgical history, Social history, and Family History were reviewed and updated.  Review of Systems: All other 10 point review of systems is negative.   Physical Exam:  height is 5' (1.524 m) and weight is 87 lb 14.4 oz (39.9 kg). Her oral temperature is 98.1 F (36.7 C). Her blood pressure is 161/88 (abnormal) and her pulse is 92. Her respiration is 18 and oxygen saturation is 100%.   Wt Readings from Last 3 Encounters:  05/09/20 87 lb 14.4 oz (39.9 kg)  04/25/20 84 lb 6.4 oz (38.3 kg)  03/21/20 88 lb (39.9 kg)    Ocular: Sclerae unicteric, pupils equal, round and reactive to light Ear-nose-throat: Oropharynx clear, dentition fair Lymphatic: No cervical or supraclavicular adenopathy Lungs no rales or rhonchi, good excursion bilaterally Heart regular rate and rhythm, no murmur appreciated Abd soft, nontender, positive bowel sounds MSK no focal spinal tenderness, no joint edema Neuro: non-focal, well-oriented, appropriate affect Breasts: Deferred   Lab Results  Component Value Date   WBC 6.3 05/09/2020   HGB 11.8 (L) 05/09/2020   HCT 37.1  05/09/2020   MCV 90.9 05/09/2020   PLT 214 05/09/2020   Lab Results  Component Value Date   FERRITIN 795 (H) 03/21/2020   IRON 50 03/21/2020   TIBC 221 (L) 03/21/2020   UIBC 171 03/21/2020   IRONPCTSAT 23 03/21/2020   Lab Results  Component Value Date   RETICCTPCT 0.4 05/09/2020   RBC 4.04 05/09/2020   RBC 4.08 05/09/2020   RETICCTABS 64.6 04/01/2009   No results found for: KPAFRELGTCHN, LAMBDASER, KAPLAMBRATIO No results found for: IGGSERUM, IGA, IGMSERUM No results found for: Kathrynn Ducking, MSPIKE, SPEI   Chemistry      Component Value Date/Time   NA 138 04/11/2020 1350   NA 135 06/16/2019 1333   K 4.1 04/11/2020 1350   CL 99 04/11/2020 1350   CO2 30 04/11/2020 1350   BUN 17 04/11/2020 1350   BUN 31 (H) 06/16/2019 1333   CREATININE 0.79 04/11/2020 1350   CREATININE 0.91  03/16/2016 1539      Component Value Date/Time   CALCIUM 9.0 04/11/2020 1350   ALKPHOS 119 04/11/2020 1350   AST 15 04/11/2020 1350   ALT 6 04/11/2020 1350   BILITOT 0.4 04/11/2020 1350       Impression and Plan: Ms. Reith is a very pleasant 75yo caucasian female with iron deficiency anemia secondary to malabsorption and possible GI blood loss with Eliquis. No ESA needed, Hgb 11.8.  Iron studies pending. We will replace if needed.  Lab and injection only in 4 weeks, follow-up in 8 weeks.  She can contact our office with any questions or concerns.   Laverna Peace, NP 2/21/202211:51 AM

## 2020-05-09 NOTE — Telephone Encounter (Signed)
Los orders were not in at time of check out and pt stated she would view them on my chart   [name]

## 2020-05-09 NOTE — Patient Instructions (Signed)
Tunneled Central Venous Catheter Flushing Guide  It is important to flush your tunneled central venous catheter each time you use it, both before and after you use it. Flushing your catheter will help prevent it from clogging. What are the risks? Risks may include:  Infection.  Air getting into the catheter and bloodstream. Supplies needed:  A clean pair of gloves.  A disinfecting wipe. Use an alcohol wipe, chlorhexidine wipe, or iodine wipe as told by your health care provider.  A 10 mL syringe that has been prefilled with saline solution.  An empty 10 mL syringe, if a substance called heparin was injected into your catheter. How to flush your catheter When you flush your catheter, make sure you follow any specific instructions from your health care provider or the manufacturer. These are general guidelines. Flushing your catheter before use If there is heparin in your catheter: 1. Wash your hands with soap and water. 2. Put on gloves. 3. Scrub the injection cap for a minimum of 15 seconds with a disinfecting wipe. 4. Unclamp the catheter. 5. Attach the empty syringe to the injection cap. 6. Pull the syringe plunger back and withdraw 10 mL of blood. 7. Place the syringe into an appropriate waste container. 8. Scrub the injection cap for 15 seconds with a disinfecting wipe. 9. Attach the prefilled syringe to the injection cap. 10. Flush the catheter by pushing the plunger forward until all the liquid from the syringe is in the catheter. 11. Remove the syringe from the injection cap. 12. Clamp the catheter. If there is no heparin in your catheter: 1. Wash your hands with soap and water. 2. Put on gloves. 3. Scrub the injection cap for 15 seconds with a disinfecting wipe. 4. Unclamp the catheter. 5. Attach the prefilled syringe to the injection cap. 6. Flush the catheter by pushing the plunger forward until 5 mL of the liquid from the syringe is in the catheter. 7. Pull back on  the syringe until you see blood in the catheter. 8. If you have been asked to collect any blood, follow your health care provider's instructions. Otherwise, flush the catheter with the rest of the solution from the syringe. 9. Remove the syringe from the injection cap. 10. Clamp the catheter.   Flushing your catheter after use 1. Wash your hands with soap and water. 2. Put on gloves. 3. Scrub the injection cap for 15 seconds with a disinfecting wipe. 4. Unclamp the catheter. 5. Attach the prefilled syringe to the injection cap. 6. Flush the catheter by pushing the plunger forward until all of the liquid from the syringe is in the catheter. 7. Remove the syringe from the injection cap. 8. Clamp the catheter. Problems and solutions  If blood cannot be completely cleared from the injection cap, you may need to have the injection cap replaced.  If the catheter is difficult to flush, use the pulsing method. The pulsing method involves pushing only a few milliliters of solution into the catheter at a time and pausing between pushes.  If you do not see blood in the catheter when you pull back on the syringe, change your body position, such as by raising your arms above your head. Take a deep breath and cough. Then, pull back on the syringe. If you still do not see blood, flush the catheter with a small amount of solution. Then, change positions again and take a breath or cough. Pull back on the syringe again. If you still do not   see blood, finish flushing the catheter and contact your health care provider. Do not use your catheter until your health care provider says it is okay. General tips  Have someone help you flush your catheter, if possible.  Do not force fluid through your catheter.  Do not use a syringe that is larger or smaller than 10 mL. Using a smaller syringe can make the catheter burst.  Do not use your catheter without flushing it first if it has heparin in it. Contact a health  care provider if:  You cannot see any blood in the catheter when you flush it before using it.  Your catheter is difficult to flush. Get help right away if:  You cannot flush the catheter.  The catheter leaks when you flush it or when there is fluid in it.  There are cracks or breaks in the catheter. Summary  It is important to flush your tunneled central venous catheter each time you use it, both before and after you use it.  Scrub the injection cap for 15 seconds with a disinfecting wipe before and after you flush it.  When you flush your catheter, make sure you follow any specific instructions from your health care provider or the manufacturer.  Get help right away if you cannot flush the catheter. This information is not intended to replace advice given to you by your health care provider. Make sure you discuss any questions you have with your health care provider. Document Revised: 05/14/2019 Document Reviewed: 05/21/2018 Elsevier Patient Education  2021 Elsevier Inc[employer]  

## 2020-05-10 ENCOUNTER — Ambulatory Visit (INDEPENDENT_AMBULATORY_CARE_PROVIDER_SITE_OTHER): Payer: HMO

## 2020-05-10 DIAGNOSIS — I10 Essential (primary) hypertension: Secondary | ICD-10-CM | POA: Diagnosis not present

## 2020-05-10 DIAGNOSIS — I480 Paroxysmal atrial fibrillation: Secondary | ICD-10-CM | POA: Diagnosis not present

## 2020-05-10 LAB — IRON AND TIBC
Iron: 77 ug/dL (ref 41–142)
Saturation Ratios: 34 % (ref 21–57)
TIBC: 225 ug/dL — ABNORMAL LOW (ref 236–444)
UIBC: 148 ug/dL (ref 120–384)

## 2020-05-10 LAB — FERRITIN: Ferritin: 1032 ng/mL — ABNORMAL HIGH (ref 11–307)

## 2020-05-10 NOTE — Patient Instructions (Addendum)
Visit Information  Goals Addressed            This Visit's Progress   . Manage My Medicine       Timeframe:  Short-Term Goal Priority:  High Start Date:      05/10/20                       Expected End Date:    06/07/20                   Follow Up Date 06/16/20   - call for medicine refill 2 or 3 days before it runs out - keep a list of all the medicines I take; vitamins and herbals too - use a pillbox to sort medicine    Why is this important?   . These steps will help you keep on track with your medicines.   Notes: PharmD sending PAP forms for Incruse and Eliquis.      Patient Care Plan: General Pharmacy (Adult)    Problem Identified: Afib, COPD, HF, HTN, HLD, CAD, Chronic Pain Syndrome, GERD, Insomnia   Priority: High  Onset Date: 05/10/2020    Long-Range Goal: Patient-Specific Goal   Start Date: 05/10/2020  Expected End Date: 08/07/2020  This Visit's Progress: On track  Priority: High  Note:   Current Barriers:  . Unable to independently afford treatment regimen   Pharmacist Clinical Goal(s):  [name] Over the next 90 days, patient will verbalize ability to afford treatment regimen . achieve adherence to monitoring guidelines and medication adherence to achieve therapeutic efficacy . contact provider office for questions/concerns as evidenced notation of same in electronic health record through collaboration with PharmD and provider.    Interventions: . 1:1 collaboration with Carollee Herter, Alferd Apa, DO regarding development and update of comprehensive plan of care as evidenced by provider attestation and co-signature . Inter-disciplinary care team collaboration (see longitudinal plan of care) . Comprehensive medication review performed; medication list updated in electronic medical record  Hypertension (BP goal <140/90) -controlled -Current treatment:  Amlodipine 2.32m daily PM -Medications previously tried: none noted  -Current home readings: checks  infrequently, noted BP usually < 140/80  -Denies hypotensive/hypertensive symptoms -Educated on BP goals and benefits of medications for prevention of heart attack, stroke and kidney damage; Importance of home blood pressure monitoring; -Counseled to monitor BP at home periodically, document, and provide log at future appointments -Recommended to continue current medication  Atrial Fibrillation (Goal: prevent stroke and major bleeding) -controlled  -Current treatment:  Anticoagulation: Eliquis 579mtwice daily (per Dr. LoEtter Sjogren-Medications previously tried: amiodarone, carvedilol -Home BP and HR readings: none noted  -Counseled on increased risk of stroke due to Afib and benefits of anticoagulation for stroke prevention; bleeding risk associated with Eliquis and importance of self-monitoring for signs/symptoms of bleeding; Most recent Hgb was close to lower range of normal, trending up recently -Recommended to continue current medication  -Patient was on PAP in 2021, has not applied this year will send paperwork she can send off after 4% OOP has been met.  COPD (Goal: control symptoms and prevent exacerbations) -controlled -Current treatment   Incruse ellipta 62.12m101mnh once daily (per Sood)  Proventil (per SooParisMedications previously tried: none noted  -Gold Grade: Gold 2 (FEV1 50-79%) -Current COPD Classification:  B (high sx, <2 exacerbations/yr) -Pulmonary function testing: 10/10/2011-pulmonary function test-FVC 1.47 (54% predicted), postbronchodilator ratio 64, postbronchodilator FEV1 1.15 (59% predicted), positive bronchodilator response, DLCO 5.3 (35% predicted) -Exacerbations requiring treatment  in last 6 months: none -Patient reports consistent use of maintenance inhaler -Frequency of rescue inhaler use: prn -Counseled on Proper inhaler technique; Benefits of consistent maintenance inhaler use -Recommended to continue current medication Assessed patient finances. She was  receiving assistance with Incruse in 2021.  Will mail patient paperwork for incruse for her to re apply for 2022.  Pain (Goal: Minimize symptoms) -controlled -Current treatment   Gabapentin $RemoveBef'600mg'KsvxhqXkPx$  4 times daily  Oxycodone 10-$RemoveBefor'325mg'XVrAtwoXfsxI$  4 times daily  Tizanidine $RemoveBef'4mg'yxOnivWlWW$  TID (only takes BID)  Acetaminophen 325 2 tabs PRN - uses every other day -Medications previously tried: none noted -Patient reports pain currently controlled as it can be, she recovered well from broken rib -She will call in the refills she needs early next week -Recommended to continue current medication   Patient Goals/Self-Care Activities . Over the next 90 days, patient will:  - take medications as prescribed check blood pressure periodically, document, and provide at future appointments collaborate with provider on medication access solutions  Follow Up Plan: The care management team will reach out to the patient again over the next 90 days.         The patient verbalized understanding of instructions, educational materials, and care plan provided today and agreed to receive a mailed copy of patient instructions, educational materials, and care plan.  Telephone follow up appointment with pharmacy team member scheduled for: 3 months  Edythe Clarity, [facility]  Atrial Fibrillation  Atrial fibrillation is a type of irregular or rapid heartbeat (arrhythmia). In atrial fibrillation, the top part of the heart (atria) beats in an irregular pattern. This makes the heart unable to pump blood normally and effectively. The goal of treatment is to prevent blood clots from forming, control your heart rate, or restore your heartbeat to a normal rhythm. If this condition is not treated, it can cause serious problems, such as a weakened heart muscle (cardiomyopathy) or a stroke. What are the causes? This condition is often caused by medical conditions that damage the heart's electrical system. These include:  High blood pressure  (hypertension). This is the most common cause.  Certain heart problems or conditions, such as heart failure, coronary artery disease, heart valve problems, or heart surgery.  Diabetes.  Overactive thyroid (hyperthyroidism).  Obesity.  Chronic kidney disease. In some cases, the cause of this condition is not known. What increases the risk? This condition is more likely to develop in:  Older people.  People who smoke.  Athletes who do endurance exercise.  People who have a family history of atrial fibrillation.  Men.  People who use drugs.  People who drink a lot of alcohol.  People who have lung conditions, such as emphysema, pneumonia, or COPD.  People who have obstructive sleep apnea. What are the signs or symptoms? Symptoms of this condition include:  A feeling that your heart is racing or beating irregularly.  Discomfort or pain in your chest.  Shortness of breath.  Sudden light-headedness or weakness.  Tiring easily during exercise or activity.  Fatigue.  Syncope (fainting).  Sweating. In some cases, there are no symptoms. How is this diagnosed? Your health care provider may detect atrial fibrillation when taking your pulse. If detected, this condition may be diagnosed with:  An electrocardiogram (ECG) to check electrical signals of the heart.  An ambulatory cardiac monitor to record your heart's activity for a few days.  A transthoracic echocardiogram (TTE) to create pictures of your heart.  A transesophageal echocardiogram (TEE) to create even  closer pictures of your heart.  A stress test to check your blood supply while you exercise.  Imaging tests, such as a CT scan or chest X-ray.  Blood tests. How is this treated? Treatment depends on underlying conditions and how you feel when you experience atrial fibrillation. This condition may be treated with:  Medicines to prevent blood clots or to treat heart rate or heart rhythm  problems.  Electrical cardioversion to reset the heart's rhythm.  A pacemaker to correct abnormal heart rhythm.  Ablation to remove the heart tissue that sends abnormal signals.  Left atrial appendage closure to seal the area where blood clots can form. In some cases, underlying conditions will be treated. Follow these instructions at home: Medicines  Take over-the counter and prescription medicines only as told by your health care provider.  Do not take any new medicines without talking to your health care provider.  If you are taking blood thinners: ? Talk with your health care provider before you take any medicines that contain aspirin or NSAIDs, such as ibuprofen. These medicines increase your risk for dangerous bleeding. ? Take your medicine exactly as told, at the same time every day. ? Avoid activities that could cause injury or bruising, and follow instructions about how to prevent falls. ? Wear a medical alert bracelet or carry a card that lists what medicines you take. Lifestyle  Do not use any products that contain nicotine or tobacco, such as cigarettes, e-cigarettes, and chewing tobacco. If you need help quitting, ask your health care provider.  Eat heart-healthy foods. Talk with a dietitian to make an eating plan that is right for you.  Exercise regularly as told by your health care provider.  Do not drink alcohol.  Lose weight if you are overweight.  Do not use drugs, including cannabis.      General instructions  If you have obstructive sleep apnea, manage your condition as told by your health care provider.  Do not use diet pills unless your health care provider approves. Diet pills can make heart problems worse.  Keep all follow-up visits as told by your health care provider. This is important. Contact a health care provider if you:  Notice a change in the rate, rhythm, or strength of your heartbeat.  Are taking a blood thinner and you notice more  bruising.  Tire more easily when you exercise or do heavy work.  Have a sudden change in weight. Get help right away if you have:  Chest pain, abdominal pain, sweating, or weakness.  Trouble breathing.  Side effects of blood thinners, such as blood in your vomit, stool, or urine, or bleeding that cannot stop.  Any symptoms of a stroke. "BE FAST" is an easy way to remember the main warning signs of a stroke: ? B - Balance. Signs are dizziness, sudden trouble walking, or loss of balance. ? E - Eyes. Signs are trouble seeing or a sudden change in vision. ? F - Face. Signs are sudden weakness or numbness of the face, or the face or eyelid drooping on one side. ? A - Arms. Signs are weakness or numbness in an arm. This happens suddenly and usually on one side of the body. ? S - Speech. Signs are sudden trouble speaking, slurred speech, or trouble understanding what people say. ? T - Time. Time to call emergency services. Write down what time symptoms started.  Other signs of a stroke, such as: ? A sudden, severe headache with no known  cause. ? Nausea or vomiting. ? Seizure. These symptoms may represent a serious problem that is an emergency. Do not wait to see if the symptoms will go away. Get medical help right away. Call your local emergency services (911 in the [place] Do not drive yourself to the hospital.   Summary  Atrial fibrillation is a type of irregular or rapid heartbeat (arrhythmia).  Symptoms include a feeling that your heart is beating fast or irregularly.  You may be given medicines to prevent blood clots or to treat heart rate or heart rhythm problems.  Get help right away if you have signs or symptoms of a stroke.  Get help right away if you cannot catch your breath or have chest pain or pressure. This information is not intended to replace advice given to you by your health care provider. Make sure you discuss any questions you have with your health care  provider. Document Revised: 08/27/2018 Document Reviewed: 08/27/2018 Elsevier Patient Education  [place].

## 2020-05-11 ENCOUNTER — Other Ambulatory Visit: Payer: Self-pay

## 2020-05-11 ENCOUNTER — Telehealth (INDEPENDENT_AMBULATORY_CARE_PROVIDER_SITE_OTHER): Payer: HMO | Admitting: Internal Medicine

## 2020-05-11 DIAGNOSIS — J479 Bronchiectasis, uncomplicated: Secondary | ICD-10-CM

## 2020-05-11 DIAGNOSIS — A310 Pulmonary mycobacterial infection: Secondary | ICD-10-CM

## 2020-05-11 NOTE — Progress Notes (Signed)
Virtual Visit via Telephone Note  I connected with [name] on 05/11/20 at  2:00 PM EST by telephone and verified that I am speaking with the correct person using two identifiers.  Location: Patient: at home Provider: at clinic   I discussed the limitations, risks, security and privacy concerns of performing an evaluation and management service by telephone and the availability of in person appointments. I also discussed with the patient that there may be a patient responsible charge related to this service. The patient expressed understanding and agreed to proceed.   History of Present Illness: occ cough On low dose abtx proph for recurrent uti Uses albuterol rescue inhaler once a day for shortness of breath at night Sometimes thick mucus in tnroat   Observations/Objective: Fluent speech No coughing noted during interview  Assessment and Plan: mucinex Continue TIW treatment for mac Try to get sputum cx for  afb cx to document clearance RTC 3 months  Follow Up Instructions:    I discussed the assessment and treatment plan with the patient. The patient was provided an opportunity to ask questions and all were answered. The patient agreed with the plan and demonstrated an understanding of the instructions.   The patient was advised to call back or seek an in-person evaluation if the symptoms worsen or if the condition fails to improve as anticipated.  I provided 10 minutes of non-face-to-face time during this encounter.   Carlyle Basques, MD

## 2020-05-12 IMAGING — DX DG CHEST 1V PORT
1 series · 1 of 1 positions shown · non-contrast
Comparison: Chest radiograph 10/02/2017.

CLINICAL DATA: Cough, shortness of breath and fever.

EXAM:
PORTABLE CHEST 1 VIEW

[chest]
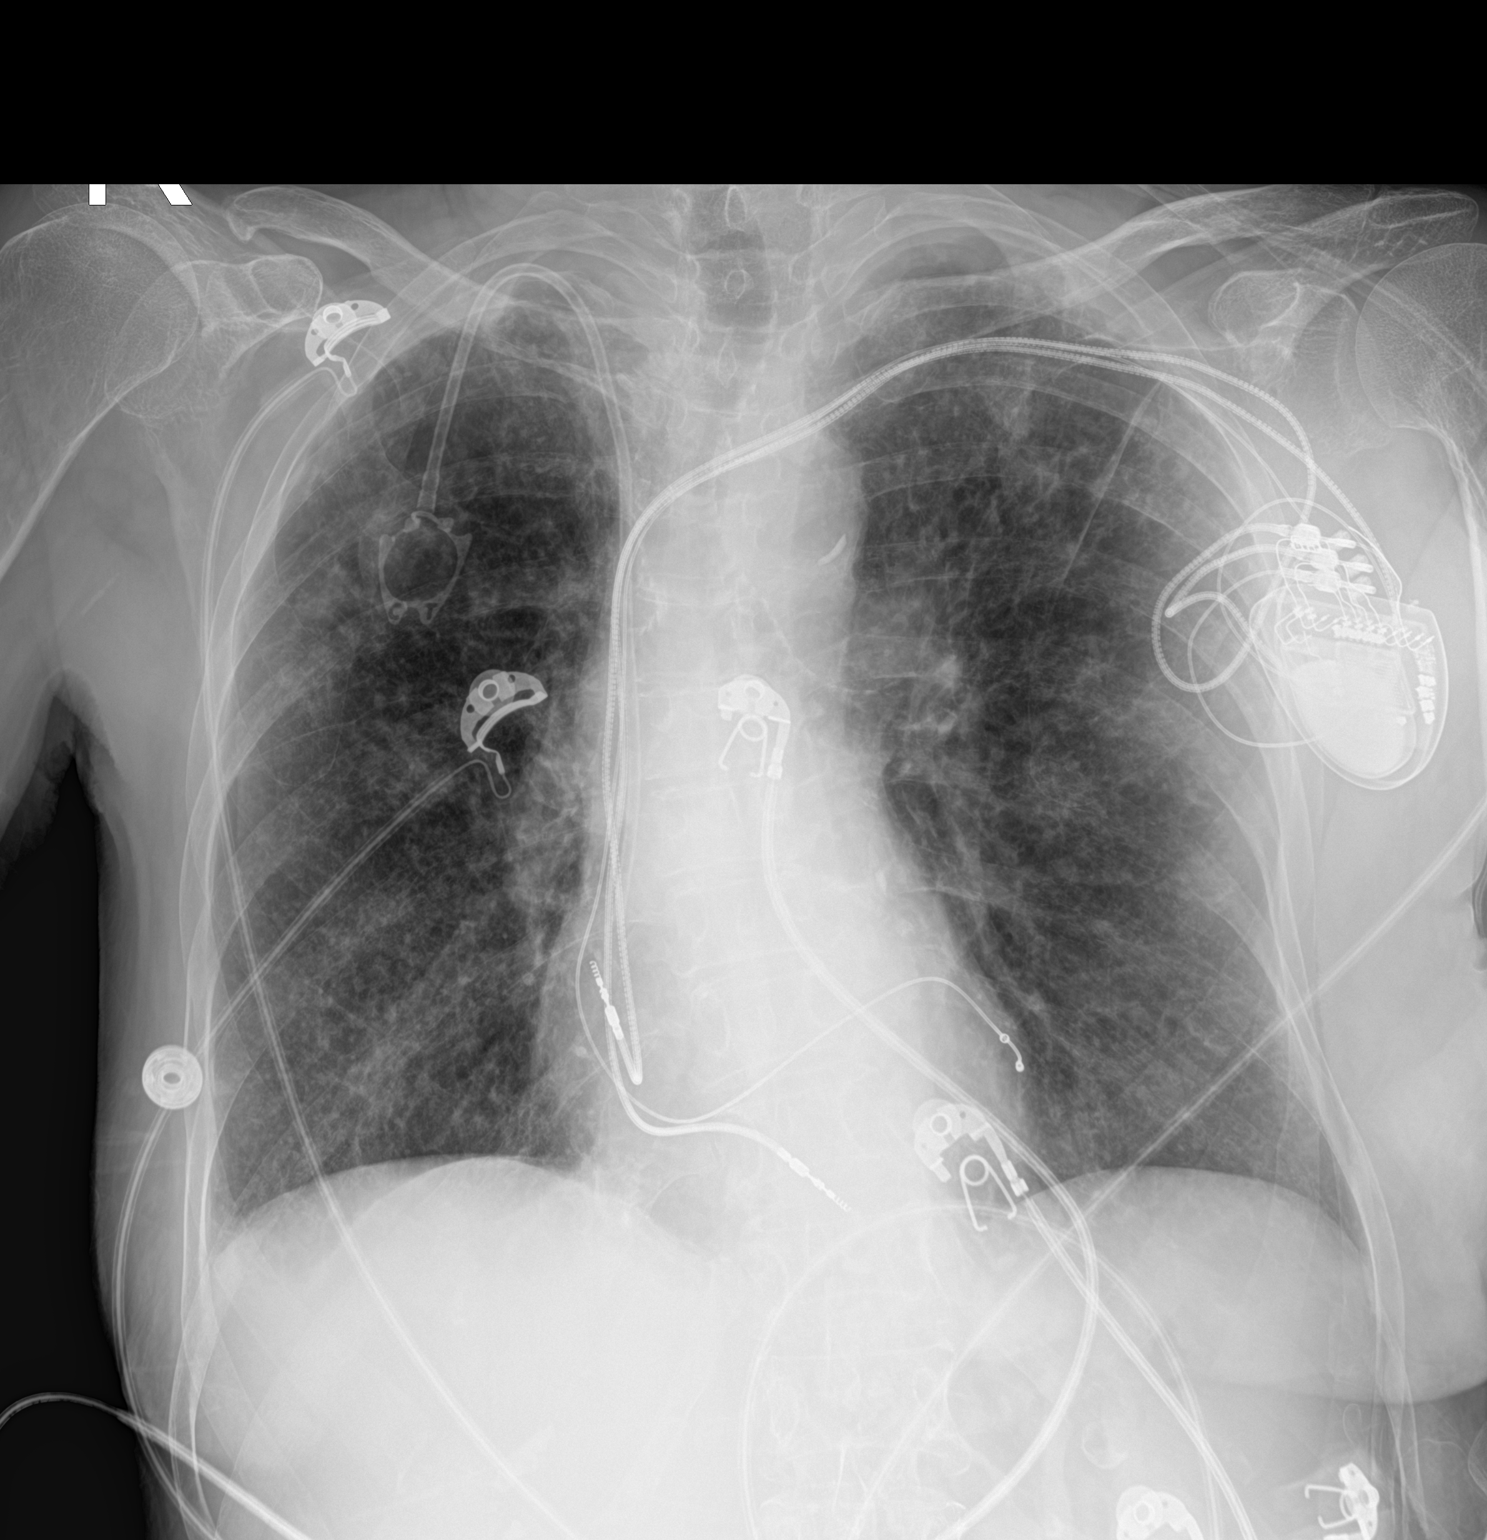

[1 of 1 positions shown; findings below may reference images not displayed]

FINDINGS: Right anterior chest wall Port-A-Cath is present with tip projecting
over the superior vena cava. Multi lead pacer apparatus overlies the
left hemithorax, leads stable in position. Monitoring leads overlie
the patient. Stable cardiac and mediastinal contours. Aortic
atherosclerosis. Similar-appearing nodular opacities predominately
involving the central aspect of the lungs bilaterally. No pleural
effusion or pneumothorax.
IMPRESSION: Scattered nodular opacities favored to correspond with history of
AITONURMI.

## 2020-05-13 ENCOUNTER — Telehealth: Payer: Self-pay | Admitting: Pharmacist

## 2020-05-13 ENCOUNTER — Other Ambulatory Visit: Payer: Self-pay | Admitting: Family Medicine

## 2020-05-13 DIAGNOSIS — R52 Pain, unspecified: Secondary | ICD-10-CM

## 2020-05-13 IMAGING — CT CT ANGIO CHEST
2 of 6 series · 19 of 46 positions shown · IV contrast (omnipaque)
Comparison: April 27, 2019

CLINICAL DATA: Chest pain and fever.

EXAM:
CT ANGIOGRAPHY CHEST WITH CONTRAST
TECHNIQUE: Multidetector CT imaging of the chest was performed using the
standard protocol during bolus administration of intravenous
contrast. Multiplanar CT image reconstructions and MIPs were
obtained to evaluate the vascular anatomy.
CONTRAST:  80mL OMNIPAQUE IOHEXOL 350 MG/ML SOLN

[Series 6: thins · axial · 0.62mm/px · z∈[-324,-64]mm · 16 of 286 slices shown]
[im 13/286  lung]
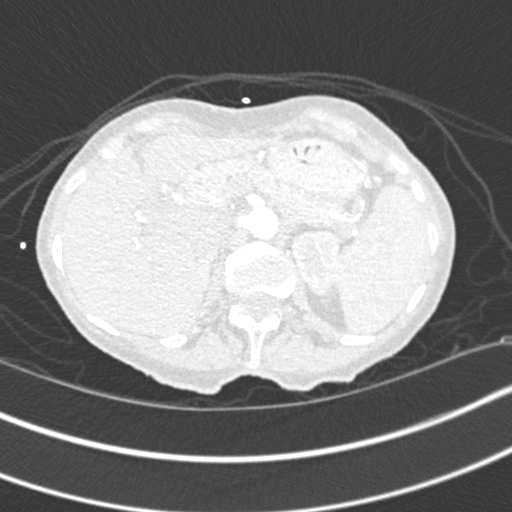
[im 38/286  soft-tissue]
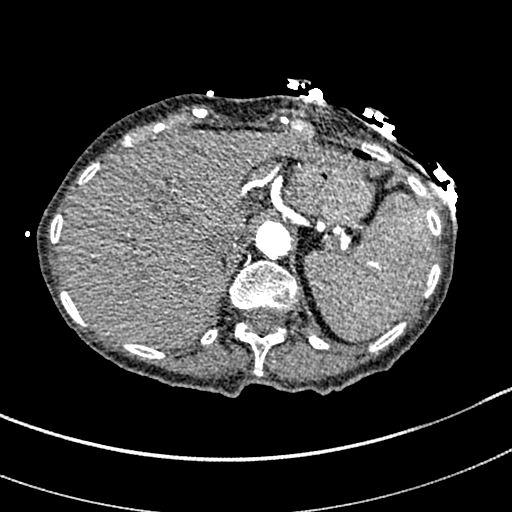
[im 50/286  lung]
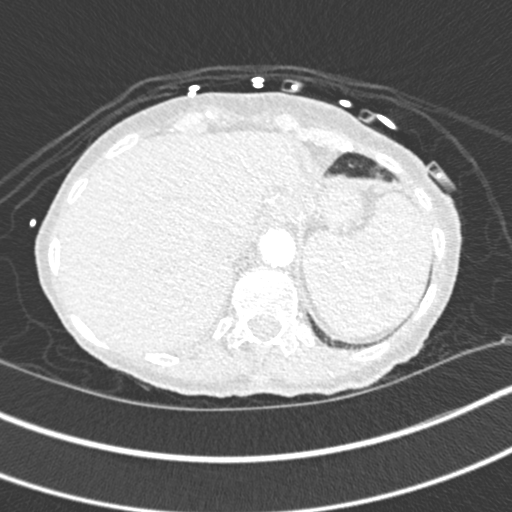
[im 62/286  soft-tissue]
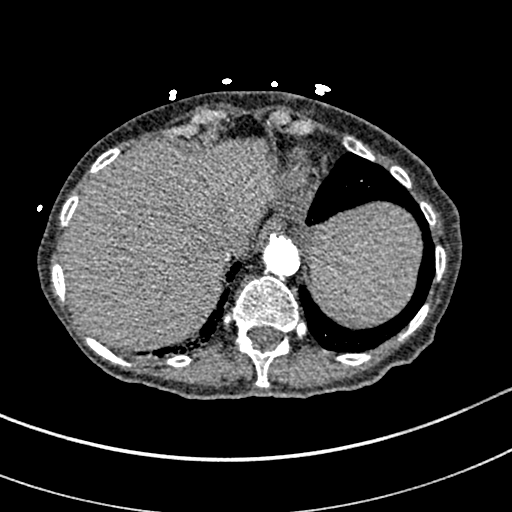
[im 87/286  lung]
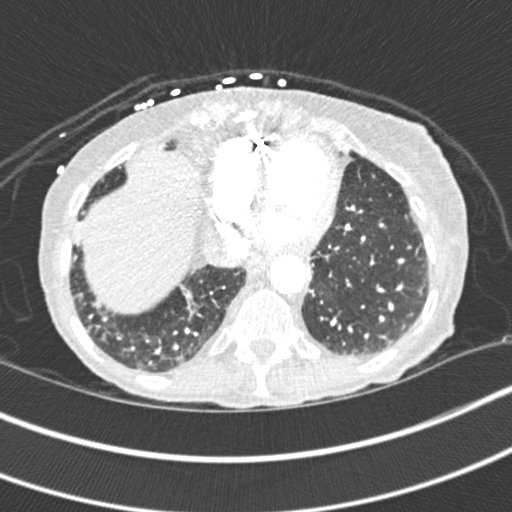
[im 100/286  soft-tissue]
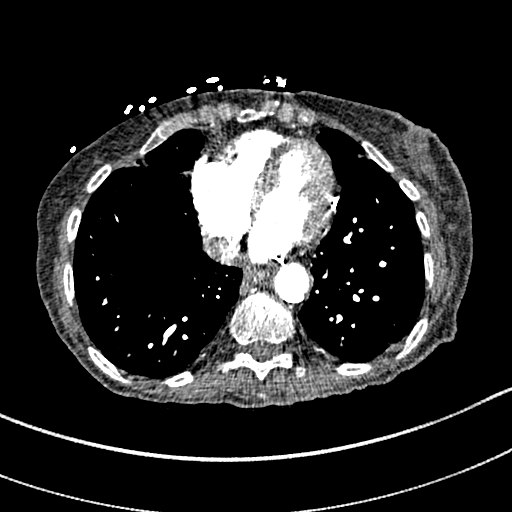
[im 112/286  lung]
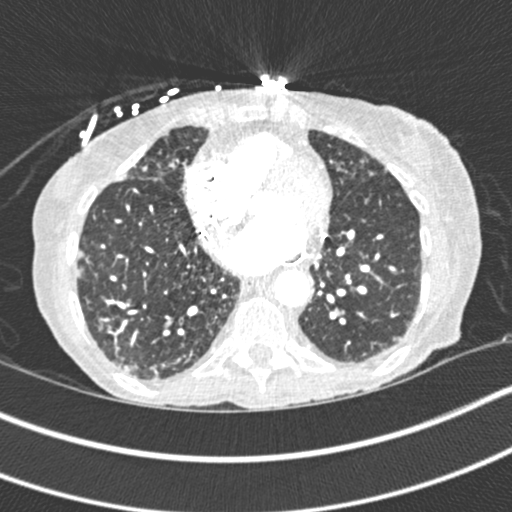
[im 137/286  soft-tissue]
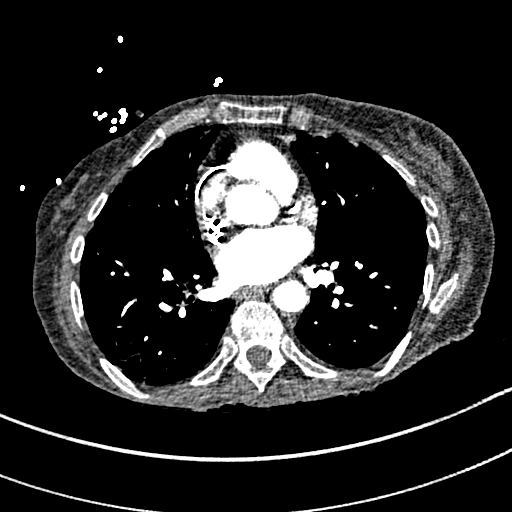
[im 149/286  lung]
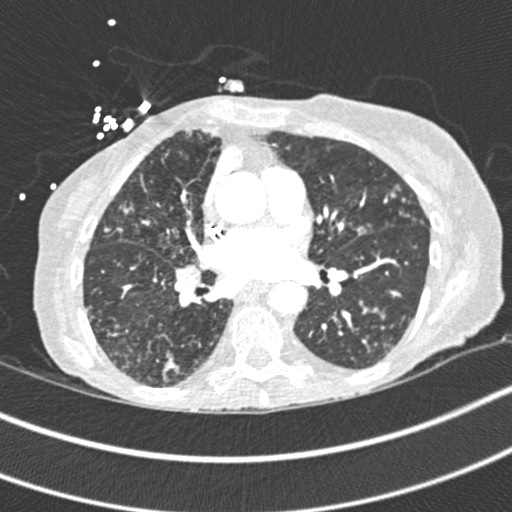
[im 174/286  soft-tissue]
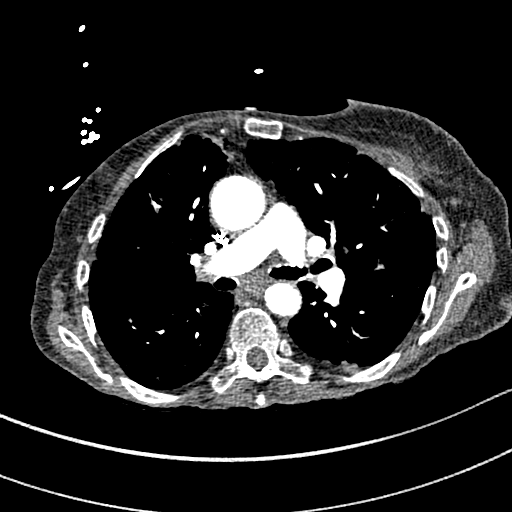
[im 186/286  lung]
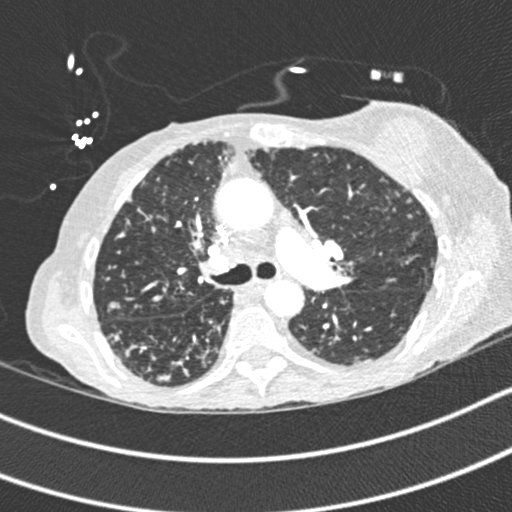
[im 199/286  soft-tissue]
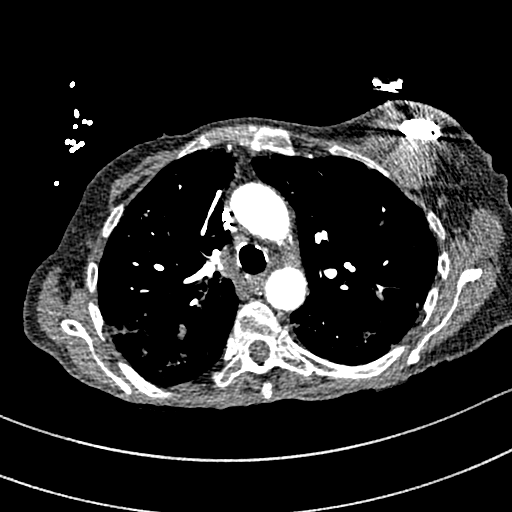
[im 224/286  lung]
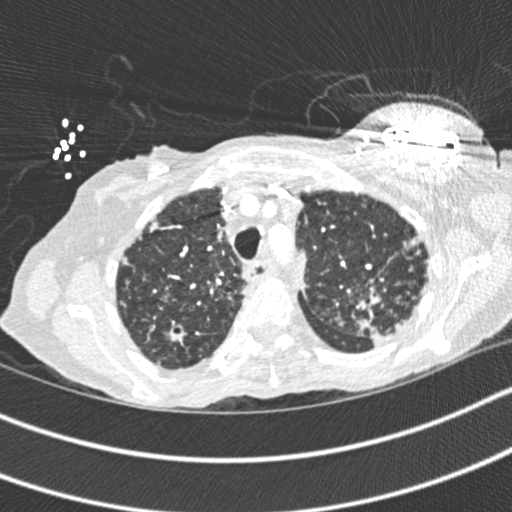
[im 236/286  soft-tissue]
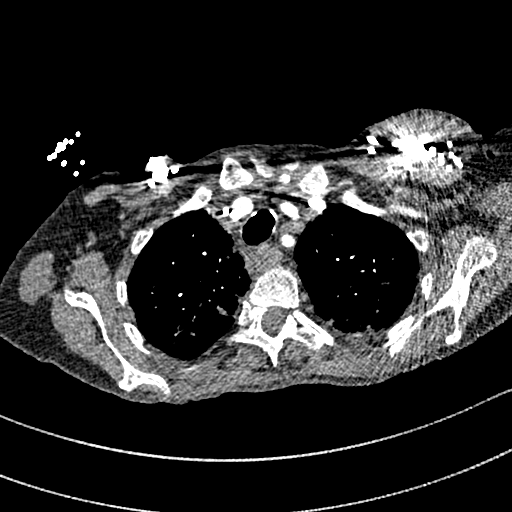
[im 248/286  lung]
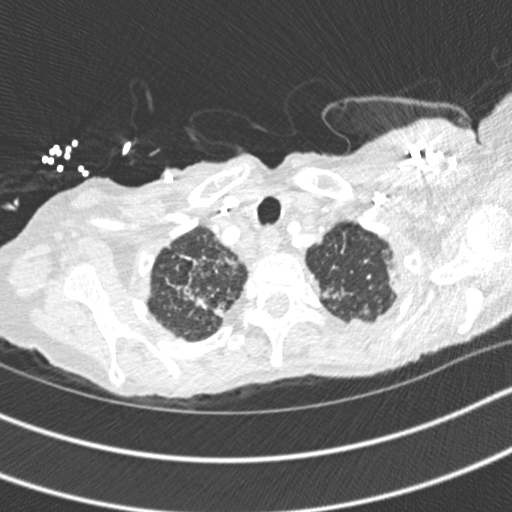
[im 273/286  soft-tissue]
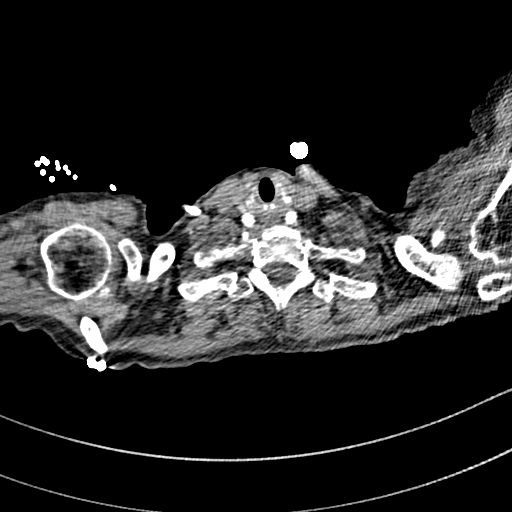

[Series 8: coronal mpr · coronal · 0.57mm/px · 3 of 123 slices shown]
[im 31/123  soft-tissue]
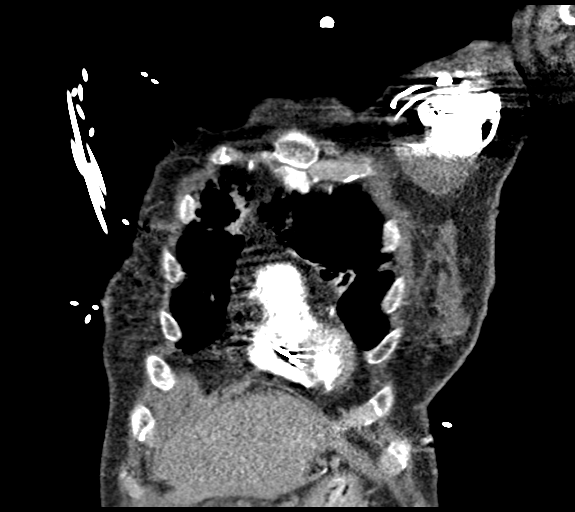
[im 62/123  soft-tissue]
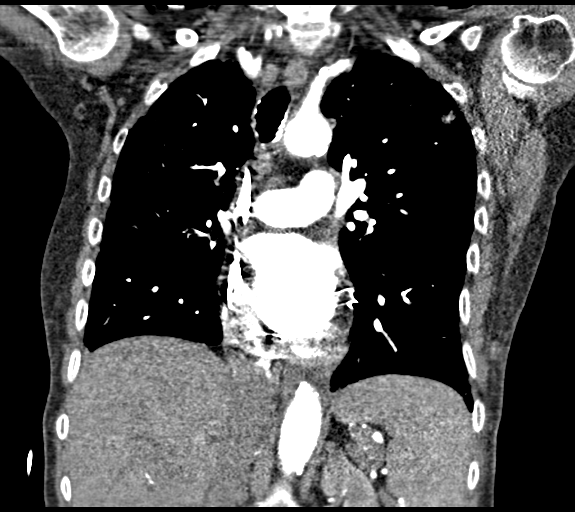
[im 92/123  soft-tissue]
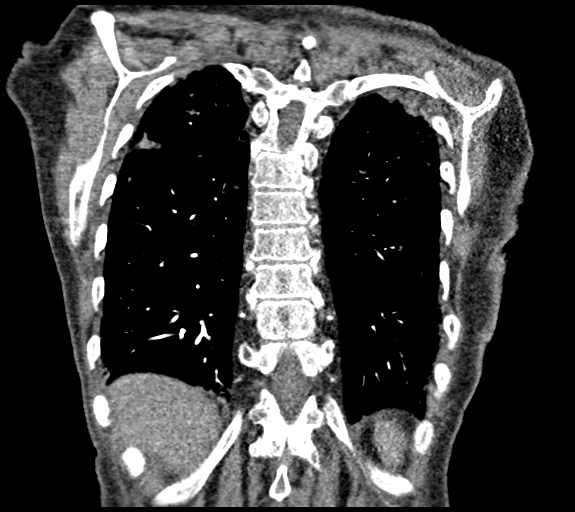

[19 of 46 positions shown; findings below may reference images not displayed]

FINDINGS: Cardiovascular: There is a dual lead AICD. There is mild to moderate
severity calcification of the thoracic aorta. Satisfactory
opacification of the pulmonary arteries to the segmental level. No
evidence of pulmonary embolism. Normal heart size. No pericardial
effusion.

Mediastinum/Nodes: There is mild right hilar lymphadenopathy.

Lungs/Pleura: Stable moderate to marked severity areas of diffuse
bilateral peribronchovascular nodularity and nodular consolidation
are seen.

There is no evidence of a pleural effusion or pneumothorax.

Upper Abdomen: No acute abnormality.

Musculoskeletal: Degenerative changes seen within the thoracic
spine.

Review of the MIP images confirms the above findings.
IMPRESSION: 1. No evidence of acute pulmonary embolism.
2. Stable moderate to marked severity areas of diffuse bilateral
peribronchovascular nodularity and nodular consolidation.
3. Aortic atherosclerosis.

Aortic Atherosclerosis (IYBLL-VEX.X).

## 2020-05-13 MED ORDER — OXYCODONE-ACETAMINOPHEN 10-325 MG PO TABS: 1.0000 | ORAL_TABLET | ORAL | 0 refills | Status: DC | PRN

## 2020-05-13 NOTE — Telephone Encounter (Signed)
Medication: oxyCODONE-acetaminophen (PERCOCET) 10-325 MG tablet [[id number]]    Has the patient contacted their pharmacy? No. (If no, request that the patient contact the pharmacy for the refill.) (If yes, when and what did the pharmacy advise?)  Preferred Pharmacy (with phone number or street name):  [facility], [address] [place], [place] [address]  Phone:  [phone] Fax:  [phone]  DEA #:  --  DAW Reason: --     Agent: Please be advised that RX refills may take up to 3 business days. We ask that you follow-up with your pharmacy.

## 2020-05-13 NOTE — Progress Notes (Addendum)
Chronic Care Management Pharmacy Assistant   Name: [name]  MRN: [id number] DOB: 08-31-1945  Reason for Encounter: PAP  PCP : Ann Held, DO  Allergies:   Allergies  Allergen Reactions   Dabigatran Etexilate Mesylate Other (See Comments)    INTERNAL BLEEDING- Pradaxa   Augmentin [Amoxicillin-Pot Clavulanate] Diarrhea and Nausea Only    Did it involve swelling of the face/tongue/throat, SOB, or low BP? No Did it involve sudden or severe rash/hives, skin peeling, or any reaction on the inside of your mouth or nose? No Did you need to seek medical attention at a hospital or doctor's office? No When did it last happen? 02/2019 If all above answers are "NO", may proceed with cephalosporin use.    Talwin [Pentazocine] Other (See Comments)    Hallucinations     Medications: Outpatient Encounter Medications as of 05/13/2020  Medication Sig Note   acetaminophen (TYLENOL) 650 MG CR tablet Take 650 mg by mouth every 8 (eight) hours as needed for pain.    albuterol (VENTOLIN HFA) 108 (90 Base) MCG/ACT inhaler Inhale 2 puffs into the lungs every 6 (six) hours as needed for wheezing or shortness of breath. 07/15/2019: Gets from the [employer] Patient Assistance Program   amLODipine (NORVASC) 2.5 MG tablet TAKE 1 TABLET BY MOUTH AT BEDTIME    atorvastatin (LIPITOR) 20 MG tablet TAKE 1 TABLET BY MOUTH ONCE DAILY AT  6PM (Patient taking differently: Take 20 mg by mouth every evening.)    azithromycin (ZITHROMAX) 500 MG tablet Take 500 mg by mouth 3 (three) times a week.    ELIQUIS 5 MG TABS tablet Take 1 tablet by mouth twice daily    ethambutol (MYAMBUTOL) 100 MG tablet Take by mouth.    ethambutol (MYAMBUTOL) 400 MG tablet Take by mouth.    furosemide (LASIX) 40 MG tablet TAKE 1 TABLET BY MOUTH IN THE MORNING    gabapentin (NEURONTIN) 600 MG tablet 1 po qid    lidocaine-prilocaine (EMLA) cream Apply 1 application topically as needed. (Patient taking differently: Apply 1  application topically as needed (for port access- to numb).)    nitrofurantoin (MACRODANTIN) 100 MG capsule Take 100 mg by mouth daily.    nitroGLYCERIN (NITROSTAT) 0.4 MG SL tablet Place 1 tablet (0.4 mg total) under the tongue every 5 (five) minutes as needed. CHEST PAIN    oxyCODONE-acetaminophen (PERCOCET) 10-325 MG tablet Take 1 tablet by mouth every 4 (four) hours as needed. PAIN    OXYGEN Inhale 3 L/min into the lungs continuous.     pantoprazole (PROTONIX) 40 MG tablet Take 1 tablet by mouth twice daily    rifabutin (MYCOBUTIN) 150 MG capsule SMARTSIG:2 Capsule(s) By Mouth 3 Times a Week    tiZANidine (ZANAFLEX) 4 MG tablet Take 1 tablet (4 mg total) by mouth 3 (three) times daily as needed for muscle spasms.    triamcinolone ointment (KENALOG) 0.1 % Apply 1 application topically daily as needed (to affected areas- for psoriasis).     umeclidinium bromide (INCRUSE ELLIPTA) 62.5 MCG/INH AEPB Inhale 1 puff into the lungs daily.    zolpidem (AMBIEN) 5 MG tablet TAKE 1 TABLET BY MOUTH EVERY DAY AT BEDTIME AS NEEDED    [DISCONTINUED] iron dextran complex (INFED) 50 MG/ML injection Please give infed infusion (no test dose needed) over 4 hours per pharmacy calculated dose. Ht: 5'2 Wt: 99 pounds Hgb: 12    No facility-administered encounter medications on file as of 05/13/2020.    Current Diagnosis:  Patient Active Problem List   Diagnosis Date Noted   Hematoma 08/07/2019   Other neutropenia ([place])    Post-procedural fever 07/20/2019   Acute febrile illness 07/16/2019   Pulmonary MAI (mycobacterium avium-intracellulare) infection ([place]) 06/24/2019   History of total right hip replacement 05/27/2019   NICM (nonischemic cardiomyopathy) ([place]) 05/19/2019   Educated about COVID-19 virus infection 09/07/2018   Erythropoietin deficiency anemia 06/19/2018   Swelling of calf 02/04/2018   Bad odor of urine 12/09/2017   Iron deficiency anemia due to chronic blood loss 08/14/2017   Iron  malabsorption 08/14/2017   Hyperlipidemia LDL goal <100 07/08/2017   Elevated ALT measurement    Hypotension 06/20/2017   HLD (hyperlipidemia) 06/20/2017   CAD (coronary artery disease) 06/20/2017   Protein-calorie malnutrition, severe ([place]) 06/20/2017   Generalized weakness    Weakness 05/30/2017   Focal neurological deficit 05/30/2017   Chronic diastolic CHF (congestive heart failure) ([place]) 05/30/2017   TIA (transient ischemic attack) 05/30/2017   AKI (acute kidney injury) ([place]) 05/30/2017   Abnormal LFTs 05/30/2017   Bronchiectasis ([place]) 04/22/2017   Right otitis media 07/17/2016   Cerumen impaction 07/17/2016   Osteoporosis 03/16/2016   Headache 11/20/2014   Skin infection 05/27/2014   Insomnia 05/27/2014   Abdominal wall pain 07/09/2013   Other dysphagia 02/02/2013   Platelets decreased ([place]) 01/29/2012   Vitamin D deficiency 12/13/2011   Hypokalemia 12/13/2011   Hypocalcemia 12/12/2011   Adhesions of intestine, very dense, with frozen abdomen 12/11/2011   SBO (small bowel obstruction), recurrent 12/11/2011   Protein-calorie malnutrition, moderate ([place]) 12/11/2011   Oxygen dependent    Hx of gastric ulcer    UTI (urinary tract infection) 10/15/2011   Allergic conjunctivitis 10/15/2011   Degenerative arthritis of hip 08/03/2011   B12 deficiency 07/02/2011   Bilateral leg weakness 01/16/2011   Anemia, chronic disease 09/14/2010   Recurrent pneumonia 08/11/2010   CRT- [employer] 06/26/2010   DYSURIA 05/19/2010   Chronic hypoxemic respiratory failure ([place]) 02/21/2010   DEHYDRATION 01/06/2010   GASTROENTERITIS 01/06/2010   Congestive dilated cardiomyopathy ([place]) 11/17/2009   LBBB 03/27/3233   SYSTOLIC HEART FAILURE, CHRONIC 11/17/2009   GERD (gastroesophageal reflux disease) 02/17/2009   NONSPECIFIC ABN FINDNG RAD&OTH EXAM BILARY TRCT 09/03/2008   COPD with emphysema ([place]) 06/28/2008   DIVERTICULOSIS, COLON 06/03/2008   GASTRITIS 06/02/2008   Atrial  fibrillation/flutter 12/08/2007   GASTROPARESIS 08/07/2007   NAUSEA, CHRONIC 08/07/2007   Abdominal pain 08/07/2007   SYNCOPE 02/17/2007   RESTLESS LEG SYNDROME 08/01/2006   Chronic pain syndrome 08/01/2006   OSTEOARTHRITIS 07/31/2006   Degenerative disc disease, lumbar 07/31/2006   Fibromyalgia 07/31/2006   CHRONIC FATIGUE SYNDROME 07/31/2006   URINARY INCONTINENCE 07/31/2006   ANEMIA-IRON DEFICIENCY 05/01/2006   Essential hypertension 05/01/2006    Goals Addressed   None    New patient assistance application form mailed out to [employer] home  for patients Eliquis and Incruse Ellipita. Patient is aware both applications are blank and is aware she has to fill them out, have proof of income and take them to the doctors office once she's done all of these things for the doctor to sign. She would like it mailed to their residence address [place] [address].   Follow-Up:  Patient Assistance Coordination and Pharmacist Review   Charlann Lange, [place] Pharmacist Assistant [phone]

## 2020-05-13 NOTE — Telephone Encounter (Signed)
Requesting: Percocet  Contract: 04/25/2020 UDS: 04/25/2020 Last OV: 04/25/2020 Next OV: 07/22/2020 Last Refill: 04/19/2020, #120--0 RF Database:   Please advise

## 2020-05-17 ENCOUNTER — Other Ambulatory Visit: Payer: Self-pay | Admitting: Internal Medicine

## 2020-05-17 ENCOUNTER — Other Ambulatory Visit: Payer: Self-pay | Admitting: Family Medicine

## 2020-05-17 DIAGNOSIS — G4700 Insomnia, unspecified: Secondary | ICD-10-CM

## 2020-05-17 IMAGING — DX DG CHEST 1V PORT
1 series · 1 of 1 positions shown · non-contrast
Comparison: 07/15/2019

CLINICAL DATA: Fever

EXAM:
PORTABLE CHEST 1 VIEW

[chest ap]
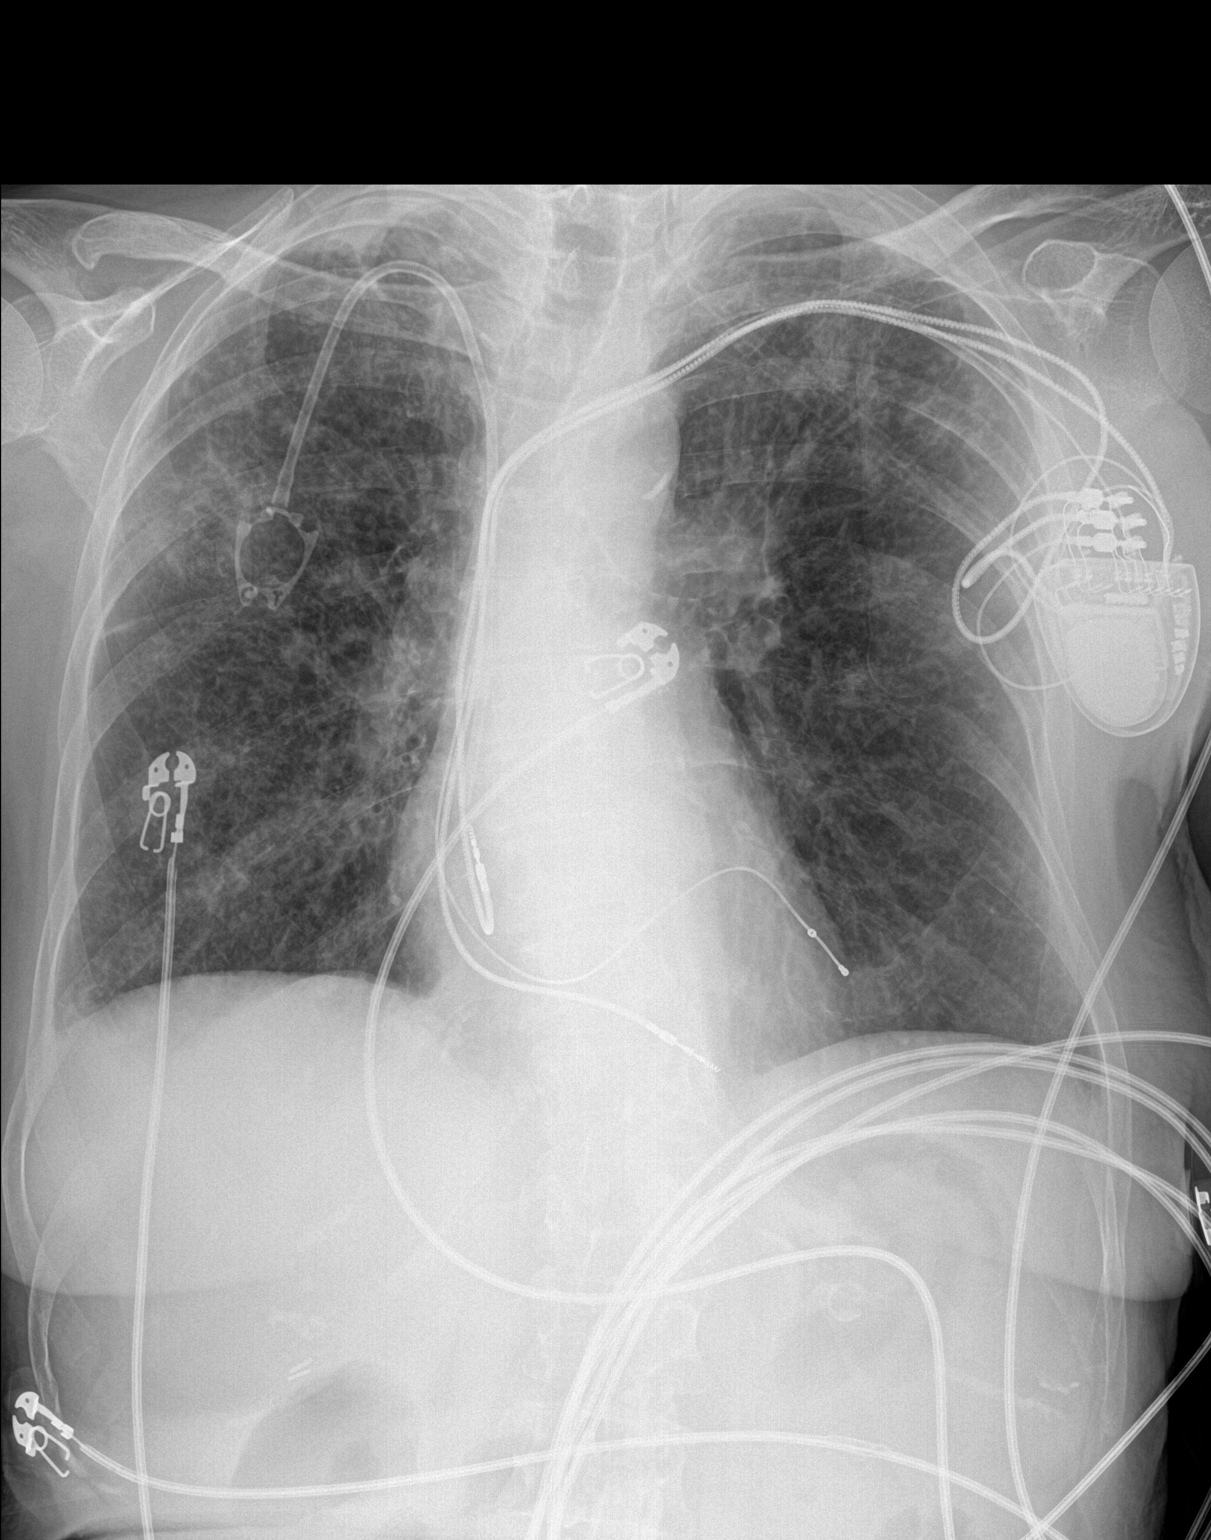

[1 of 1 positions shown; findings below may reference images not displayed]

FINDINGS: Right Port-A-Cath and left pacer remain in place, unchanged. Heart
is normal size. Chronic nodular opacities within the lungs
bilaterally, particularly upper lobes, unchanged. No effusions. No
acute bony abnormality.
IMPRESSION: Stable nodular opacities in the lungs, particularly upper lobes.
This likely reflects previously seen mycobacterium avium infection
changes.

## 2020-05-17 NOTE — Telephone Encounter (Signed)
Requesting: Ambien 5mg  Contract: 04/25/2020 UDS: 04/25/2020 Last Visit: 04/25/2020 Next Visit:07/22/2020 Last Refill: 04/18/2020 #30 and 0RF  Please Advise

## 2020-06-03 ENCOUNTER — Telehealth: Payer: Self-pay | Admitting: Pulmonary Disease

## 2020-06-03 NOTE — Telephone Encounter (Signed)
lmtcb for pt. Per phone note from 11.17.2021 pt was getting her inhaler through pt assistance. Pt last seen 2.26.2021 by Wyn Quaker, NP. Pt has pending appt in 06/2020 to see Dr. Halford Chessman.

## 2020-06-06 ENCOUNTER — Inpatient Hospital Stay: Payer: HMO

## 2020-06-06 ENCOUNTER — Inpatient Hospital Stay: Payer: HMO | Attending: Family

## 2020-06-06 ENCOUNTER — Other Ambulatory Visit: Payer: Self-pay

## 2020-06-06 VITALS — BP 141/65 | HR 82 | Temp 98.2°F | Resp 18

## 2020-06-06 DIAGNOSIS — D500 Iron deficiency anemia secondary to blood loss (chronic): Secondary | ICD-10-CM

## 2020-06-06 DIAGNOSIS — Z95828 Presence of other vascular implants and grafts: Secondary | ICD-10-CM

## 2020-06-06 DIAGNOSIS — N189 Chronic kidney disease, unspecified: Secondary | ICD-10-CM | POA: Insufficient documentation

## 2020-06-06 DIAGNOSIS — D631 Anemia in chronic kidney disease: Secondary | ICD-10-CM | POA: Insufficient documentation

## 2020-06-06 DIAGNOSIS — Z79899 Other long term (current) drug therapy: Secondary | ICD-10-CM | POA: Diagnosis not present

## 2020-06-06 LAB — CMP (CANCER CENTER ONLY)
ALT: 52 U/L — ABNORMAL HIGH (ref 0–44)
AST: 54 U/L — ABNORMAL HIGH (ref 15–41)
Albumin: 4.2 g/dL (ref 3.5–5.0)
Alkaline Phosphatase: 202 U/L — ABNORMAL HIGH (ref 38–126)
Anion gap: 7 (ref 5–15)
BUN: 16 mg/dL (ref 8–23)
CO2: 30 mmol/L (ref 22–32)
Calcium: 8.4 mg/dL — ABNORMAL LOW (ref 8.9–10.3)
Chloride: 101 mmol/L (ref 98–111)
Creatinine: 0.85 mg/dL (ref 0.44–1.00)
GFR, Estimated: >60 mL/min (ref >60)
Glucose, Bld: 102 mg/dL — ABNORMAL HIGH (ref 70–99)
Potassium: 4.1 mmol/L (ref 3.5–5.1)
Sodium: 138 mmol/L (ref 135–145)
Total Bilirubin: 0.4 mg/dL (ref 0.3–1.2)
Total Protein: 6.9 g/dL (ref 6.5–8.1)

## 2020-06-06 LAB — CBC WITH DIFFERENTIAL (CANCER CENTER ONLY)
Abs Immature Granulocytes: 0.07 10*3/uL (ref 0.00–0.07)
Basophils Absolute: 0 10*3/uL (ref 0.0–0.1)
Basophils Relative: 0 %
Eosinophils Absolute: 0.2 10*3/uL (ref 0.0–0.5)
Eosinophils Relative: 5 %
HCT: 32.3 % — ABNORMAL LOW (ref 36.0–46.0)
Hemoglobin: 10.4 g/dL — ABNORMAL LOW (ref 12.0–15.0)
Immature Granulocytes: 2 %
Lymphocytes Relative: 11 %
Lymphs Abs: 0.5 10*3/uL — ABNORMAL LOW (ref 0.7–4.0)
MCH: 29.4 pg (ref 26.0–34.0)
MCHC: 32.2 g/dL (ref 30.0–36.0)
MCV: 91.2 fL (ref 80.0–100.0)
Monocytes Absolute: 0.4 10*3/uL (ref 0.1–1.0)
Monocytes Relative: 9 %
Neutro Abs: 3.3 10*3/uL (ref 1.7–7.7)
Neutrophils Relative %: 73 %
Platelet Count: 174 10*3/uL (ref 150–400)
RBC: 3.54 MIL/uL — ABNORMAL LOW (ref 3.87–5.11)
RDW: 14.5 % (ref 11.5–15.5)
WBC Count: 4.5 10*3/uL (ref 4.0–10.5)
nRBC: 0 % (ref 0.0–0.2)

## 2020-06-06 LAB — RETICULOCYTES
Immature Retic Fract: 4.8 % (ref 2.3–15.9)
RBC.: 3.56 MIL/uL — ABNORMAL LOW (ref 3.87–5.11)
Retic Count, Absolute: 17.8 10*3/uL — ABNORMAL LOW (ref 19.0–186.0)
Retic Ct Pct: 0.5 % (ref 0.4–3.1)

## 2020-06-06 MED ORDER — HEPARIN SOD (PORK) LOCK FLUSH 100 UNIT/ML IV SOLN
500.0000 [IU] | Freq: Once | INTRAVENOUS | Status: AC
  Administered 2020-06-06: 500 [IU] via INTRAVENOUS
  Filled 2020-06-06: qty 5

## 2020-06-06 MED ORDER — INCRUSE ELLIPTA 62.5 MCG/INH IN AEPB: 1.0000 | INHALATION_SPRAY | Freq: Every day | RESPIRATORY_TRACT | 5 refills | Status: DC

## 2020-06-06 MED ORDER — SODIUM CHLORIDE 0.9% FLUSH
10.0000 mL | Freq: Once | INTRAVENOUS | Status: AC
  Administered 2020-06-06: 10 mL via INTRAVENOUS
  Filled 2020-06-06: qty 10

## 2020-06-06 MED ORDER — EPOETIN ALFA-EPBX 40000 UNIT/ML IJ SOLN
40000.0000 [IU] | Freq: Once | INTRAMUSCULAR | Status: AC
  Administered 2020-06-06: 40000 [IU] via SUBCUTANEOUS

## 2020-06-06 MED ORDER — EPOETIN ALFA-EPBX 40000 UNIT/ML IJ SOLN
INTRAMUSCULAR | Status: AC
  Filled 2020-06-06: qty 1

## 2020-06-06 NOTE — Telephone Encounter (Signed)
Called and spoke with patient. She stated that she did apply for patient assistance through [place] but was told that [place] no longer provides assistance for Incruse. Because of this, she is having to pay out of pocket for her medication. She requested to have a RX sent to [facility] on [facility]. I attempted to see if we had any Incruse samples but we did not.   RX has been sent in for patient. Nothing further needed at time of call.

## 2020-06-06 NOTE — Telephone Encounter (Signed)
Pt returning ap hone call. Pt can be reached at at [phone]

## 2020-06-06 NOTE — Patient Instructions (Signed)

## 2020-06-06 NOTE — Patient Instructions (Signed)

## 2020-06-07 ENCOUNTER — Other Ambulatory Visit: Payer: Self-pay | Admitting: Family Medicine

## 2020-06-07 DIAGNOSIS — R52 Pain, unspecified: Secondary | ICD-10-CM

## 2020-06-07 LAB — IRON AND TIBC
Iron: 81 ug/dL (ref 41–142)
Saturation Ratios: 41 % (ref 21–57)
TIBC: 198 ug/dL — ABNORMAL LOW (ref 236–444)
UIBC: 117 ug/dL — ABNORMAL LOW (ref 120–384)

## 2020-06-07 LAB — FERRITIN: Ferritin: 1242 ng/mL — ABNORMAL HIGH (ref 11–307)

## 2020-06-07 MED ORDER — OXYCODONE-ACETAMINOPHEN 10-325 MG PO TABS: 1.0000 | ORAL_TABLET | ORAL | 0 refills | Status: DC | PRN

## 2020-06-07 NOTE — Telephone Encounter (Signed)
Requesting: Percocet  Contract: 11/06/2018 UDS: 04/25/20  Last Visit: 04/25/2020 Next Visit: 07/22/2020 Last Refill:  05/13/2020

## 2020-06-07 NOTE — Telephone Encounter (Signed)
Medication: oxyCODONE-acetaminophen (PERCOCET) 10-325 MG tablet [[id number]]       Has the patient contacted their pharmacy?  (If no, request that the patient contact the pharmacy for the refill.) (If yes, when and what did the pharmacy advise?)     Preferred Pharmacy (with phone number or street name): [facility], [address] [place], [place] [address]  Phone:  [phone] Fax:  [phone]      Agent: Please be advised that RX refills may take up to 3 business days. We ask that you follow-up with your pharmacy.

## 2020-06-13 ENCOUNTER — Telehealth: Payer: Self-pay

## 2020-06-13 DIAGNOSIS — Z596 Low income: Secondary | ICD-10-CM

## 2020-06-13 NOTE — Progress Notes (Signed)
[facility])                                            [place] Team    06/13/2020  [name] Ross 28-Mar-1945 [id number]   [name] was contacted for medication adherence by my pharmacy technician and requested help with medication assistance for Eliquis and Incruse Ellipta. I contacted the patient to see what her needs were. The patient was sent a PAP application for Eliquis and Incruse from Leata Mouse, PharmD, which she was still in possession of. I have spoken with the patient and her husband today and he reports that the patient portion of the PAP applications are complete and he will bring them into her PCP's office tomorrow.   I have obtained the patient's out of pocket (OOP) expense summary from [employer] and faxed that to Tessie Fass, PharmD this morning. Tammy and I have spoken and she will be completing the PAP process for [name] for [employer].   Thanks,  Reed Breech, [place] [phone]

## 2020-06-14 ENCOUNTER — Other Ambulatory Visit: Payer: Self-pay | Admitting: Family Medicine

## 2020-06-14 DIAGNOSIS — G4700 Insomnia, unspecified: Secondary | ICD-10-CM

## 2020-06-15 NOTE — Telephone Encounter (Signed)
Requesting: Ambien  Contract: 04/25/2020 UDS: 04/25/2020 Last OV: 0207/2022 Next OV: 07/22/2020 Last Refill: 05/17/2020, #30--0 Rf Database:   Please advise

## 2020-06-17 ENCOUNTER — Telehealth: Payer: Self-pay | Admitting: Family Medicine

## 2020-06-17 ENCOUNTER — Ambulatory Visit (INDEPENDENT_AMBULATORY_CARE_PROVIDER_SITE_OTHER): Payer: HMO

## 2020-06-17 DIAGNOSIS — I428 Other cardiomyopathies: Secondary | ICD-10-CM

## 2020-06-17 DIAGNOSIS — I480 Paroxysmal atrial fibrillation: Secondary | ICD-10-CM

## 2020-06-17 NOTE — Telephone Encounter (Signed)
Patient spouse came by office dropped off Rx assistance forms from [place] for provider completion & fax back to each pharmaceutical [place] program.  Placing packets in provider tray for review & completion.  Please contact pt/spouse if there are any questions or concerns.  --glh

## 2020-06-20 ENCOUNTER — Ambulatory Visit (INDEPENDENT_AMBULATORY_CARE_PROVIDER_SITE_OTHER): Payer: HMO | Admitting: Pharmacist

## 2020-06-20 ENCOUNTER — Other Ambulatory Visit: Payer: Self-pay | Admitting: Family Medicine

## 2020-06-20 DIAGNOSIS — I10 Essential (primary) hypertension: Secondary | ICD-10-CM

## 2020-06-20 DIAGNOSIS — J439 Emphysema, unspecified: Secondary | ICD-10-CM

## 2020-06-20 DIAGNOSIS — I480 Paroxysmal atrial fibrillation: Secondary | ICD-10-CM

## 2020-06-20 MED ORDER — INCRUSE ELLIPTA 62.5 MCG/INH IN AEPB: 1.0000 | INHALATION_SPRAY | Freq: Every day | RESPIRATORY_TRACT | 3 refills | Status: DC

## 2020-06-20 NOTE — Chronic Care Management (AMB) (Signed)
Chronic Care Management Pharmacy Note  06/20/2020 Name:  [name] MRN:  [id number] DOB:  [birth year]  Subjective: [name] is an 75 y.o. year old female who is a primary patient of Ann Held, DO.  The CCM team was consulted for assistance with disease management and care coordination needs.    Engaged with patient by telephone for follow up visit in response to provider referral for pharmacy case management and/or care coordination services.   Consent to Services:  The patient was given information about Chronic Care Management services, agreed to services, and gave verbal consent prior to initiation of services.  Please see initial visit note for detailed documentation.   Patient Care Team: Carollee Herter, Alferd Apa, DO as PCP - General Minus Breeding, MD as PCP - Cardiology (Cardiology) Deboraha Sprang, MD as PCP - Electrophysiology (Cardiology) Rolm Bookbinder, MD as Consulting Physician (General Surgery) Lafayette Dragon, MD (Inactive) as Consulting Physician (Gastroenterology) Gatha Mayer, MD as Consulting Physician (Gastroenterology) Rutherford Guys, MD as Consulting Physician (Ophthalmology) Crista Luria, MD as Consulting Physician (Dermatology) Deboraha Sprang, MD as Consulting Physician (Cardiology) Minus Breeding, MD as Consulting Physician (Cardiology) Cherre Robins, PharmD (Pharmacist)  Recent office visits: 04/25/2020 - PCP (Dr Etter Sjogren) F/U chronic pain / fibrolyalgia. No medication changes made.  Recent consult visits: 05/11/2020 - ID (Dr Baxter Flattery) video visit for MAI infection. Recommendations: mucinex. Continue TIW treatment for MAC. Sputum cx for afb cx to document clearance. RTC 3 months  05/09/2020 - hematology (Dr Vergia Alcon) f/u iron def anemia and EPO deficient anemia; Currently receiving  IV iron as indicated and Retacrit 40,000 units SQfor hgb <11; HGB ws 11.8 and no ESA needed at this visit. Dr Vergia Alcon to continue to check labs  and replace iron as needed.   Hospital visits: None in previous 6 months  Objective:  Lab Results  Component Value Date   CREATININE 0.85 06/06/2020   CREATININE 0.79 05/09/2020   CREATININE 0.79 04/11/2020    Lab Results  Component Value Date   HGBA1C 5.2 05/31/2017   Last diabetic Eye exam: No results found for: HMDIABEYEEXA  Last diabetic Foot exam: No results found for: HMDIABFOOTEX      Component Value Date/Time   CHOL 102 05/11/2019 1045   TRIG 97 05/11/2019 1045   TRIG 76 03/11/2006 1059   HDL 29 (L) 05/11/2019 1045   CHOLHDL 3.5 05/11/2019 1045   VLDL 19 05/11/2019 1045   LDLCALC 54 05/11/2019 1045    Hepatic Function Latest Ref Rng & Units 06/06/2020 05/09/2020 04/11/2020  Total Protein 6.5 - 8.1 g/dL 6.9 7.5 7.3  Albumin 3.5 - 5.0 g/dL 4.2 4.6 4.1  AST 15 - 41 U/L 54(H) 19 15  ALT 0 - 44 U/L 52(H) 15 6  Alk Phosphatase 38 - 126 U/L 202(H) 138(H) 119  Total Bilirubin 0.3 - 1.2 mg/dL 0.4 0.4 0.4  Bilirubin, Direct 0.0 - 0.3 mg/dL - - -    Lab Results  Component Value Date/Time   TSH 7.708 (H) 07/20/2019 08:05 PM   TSH 4.43 06/26/2010 01:55 PM   TSH 1.05 02/15/2009 10:23 AM   FREET4 0.68 07/20/2019 08:30 PM    CBC Latest Ref Rng & Units 06/06/2020 05/09/2020 04/11/2020  WBC 4.0 - 10.5 K/uL 4.5 6.3 7.4  Hemoglobin 12.0 - 15.0 g/dL 10.4(L) 11.8(L) 10.2(L)  Hematocrit 36.0 - 46.0 % 32.3(L) 37.1 31.8(L)  Platelets 150 - 400 K/uL 174 214 277    Lab Results  Component Value Date/Time   VD25OH 16.46 (L) 09/06/2015 02:16 PM   VD25OH 4.8 (L) 05/20/2014 03:33 PM   VD25OH 15 (L) 12/12/2011 06:45 PM    Clinical ASCVD: Yes  The ASCVD Risk score Mikey Bussing DC Jr., et al., 2013) failed to calculate for the following reasons:   The valid total cholesterol range is 130 to 320 mg/dL    Other: (CHADS2VASc if Afib, PHQ9 if depression, MMRC or CAT for COPD, ACT, DEXA)  Social History   Tobacco Use  Smoking Status Former Smoker  . Packs/day: 1.00  . Years: 20.00  .  Pack years: 20.00  . Quit date: 03/19/1986  . Years since quitting: 34.2  Smokeless Tobacco Never Used   BP Readings from Last 3 Encounters:  06/06/20 (!) 141/65  05/09/20 (!) 161/88  04/25/20 140/80   Pulse Readings from Last 3 Encounters:  06/06/20 82  05/09/20 92  04/25/20 92   Wt Readings from Last 3 Encounters:  05/09/20 87 lb 14.4 oz (39.9 kg)  04/25/20 84 lb 6.4 oz (38.3 kg)  03/21/20 88 lb (39.9 kg)    Assessment: Review of patient past medical history, allergies, medications, health status, including review of consultants reports, laboratory and other test data, was performed as part of comprehensive evaluation and provision of chronic care management services.   SDOH:  (Social Determinants of Health) assessments and interventions performed:  SDOH Interventions   Flowsheet Row Most Recent Value  SDOH Interventions   Financial Strain Interventions Other (Comment)  [CCM clinical pharmacist assisting with applying for patient assistance.]      CCM Care Plan  Allergies  Allergen Reactions  . Dabigatran Etexilate Mesylate Other (See Comments)    INTERNAL BLEEDING- Pradaxa  . Augmentin [Amoxicillin-Pot Clavulanate] Diarrhea and Nausea Only    Did it involve swelling of the face/tongue/throat, SOB, or low BP? No Did it involve sudden or severe rash/hives, skin peeling, or any reaction on the inside of your mouth or nose? No Did you need to seek medical attention at a hospital or doctor's office? No When did it last happen?02/2019 If all above answers are "NO", may proceed with cephalosporin use.   [name] [Pentazocine] Other (See Comments)    Hallucinations     Medications Reviewed Today    Reviewed by Cherre Robins, PharmD (Pharmacist) on 06/20/20 at 74  Med List Status: <None>  Medication Order Taking? Sig Documenting Provider Last Dose Status Informant  acetaminophen (TYLENOL) 650 MG CR tablet [id number] Yes Take 650 mg by mouth every 8 (eight) hours as  needed for pain. [provider] Taking Active Self  albuterol (VENTOLIN HFA) 108 (90 Base) MCG/ACT inhaler [id number] Yes Inhale 2 puffs into the lungs every 6 (six) hours as needed for wheezing or shortness of breath. [provider] Taking Active Self           Med Note Antony Contras, Curtez Brallier B   Mon Jun 20, 2020 11:19 AM) No longer getting from PAP - now available generically  amLODipine (NORVASC) 2.5 MG tablet [id number] Yes TAKE 1 TABLET BY MOUTH AT BEDTIME Minus Breeding, MD Taking Active   atorvastatin (LIPITOR) 20 MG tablet [id number] Yes TAKE 1 TABLET BY MOUTH ONCE DAILY AT  6PM  Patient taking differently: Take 20 mg by mouth every evening.   Carollee Herter, Kendrick Fries R, DO Taking Active   azithromycin (ZITHROMAX) 500 MG tablet [id number] Yes TAKE 1 TABLETS BY MOUTH THREE TIMES A WEEK Carlyle Basques, MD Taking Active   ELIQUIS 5  MG TABS tablet [id number] Yes Take 1 tablet by mouth twice daily Carollee Herter, Yvonne R, DO Taking Active   ethambutol (MYAMBUTOL) 100 MG tablet [id number] Yes TAKE 2 TABLETS BY MOUTH THREE TIMES A WEEK. TAKE WITH 2 TABLETS OF 400 MG OF ETHAMBUTOL TO EQUAL 1000 MG TOTAL Carlyle Basques, MD Taking Active   ethambutol (MYAMBUTOL) 400 MG tablet [id number] Yes TAKE 2 TABLETS BY MOUTH THREE TIMES A WEEK **TAKE WITH 2 TABLETS OF 100 MG OF ETHAMBUTOL TO EQUAL 1000 MG TOTALCarlyle Basques, MD Taking Active   furosemide (LASIX) 40 MG tablet [id number] Yes TAKE 1 TABLET BY MOUTH IN THE [name], MD Taking Active   gabapentin (NEURONTIN) 600 MG tablet [id number] Yes 1 po qid Roma Schanz R, DO Taking Active         Discontinued 06/05/11 1433 (Error)   lidocaine-prilocaine (EMLA) cream [id number] Yes Apply 1 application topically as needed.  Patient taking differently: Apply 1 application topically as needed (for port access- to numb).   Volanda Napoleon, MD Taking Active   nitrofurantoin (MACRODANTIN) 100 MG capsule [id number] Yes Take 100 mg by mouth  daily. [provider] Taking Active   nitroGLYCERIN (NITROSTAT) 0.4 MG SL tablet [id number] Yes Place 1 tablet (0.4 mg total) under the tongue every 5 (five) minutes as needed. CHEST PAIN Minus Breeding, MD Taking Active Self  oxyCODONE-acetaminophen (PERCOCET) 10-325 MG tablet [id number]  Take 1 tablet by mouth every 4 (four) hours as needed. PAIN Ann Held, [place]  Active   OXYGEN [id number] Yes Inhale 3 L/min into the lungs continuous.  [provider] Taking Active Self  pantoprazole (PROTONIX) 40 MG tablet [id number] Yes Take 1 tablet by mouth twice daily Gatha Mayer, MD Taking Active   rifabutin [facility]) 150 MG capsule [id number] Yes TAKE 2 CAPSULES (300 MG TOTAL) BY MOUTH THREE TIMES A Marquette Old, MD Taking Active   tiZANidine (ZANAFLEX) 4 MG tablet [id number] Yes Take 1 tablet (4 mg total) by mouth 3 (three) times daily as needed for muscle spasms. Ann Held, DO Taking Active   triamcinolone ointment (KENALOG) 0.1 % [id number] Yes Apply 1 application topically daily as needed (to affected areas- for psoriasis).  [provider] Taking Active Self  umeclidinium bromide (INCRUSE ELLIPTA) 62.5 MCG/INH AEPB [id number] Yes Inhale 1 puff into the lungs daily. Chesley Mires, MD Taking Active   zolpidem (AMBIEN) 5 MG tablet [id number] Yes TAKE 1 TABLET BY MOUTH EVERY DAY AT BEDTIME AS NEEDED Ann Held, DO Taking Active           Patient Active Problem List   Diagnosis Date Noted  . Hematoma 08/07/2019  . Other neutropenia ([place])   . Post-procedural fever 07/20/2019  . Acute febrile illness 07/16/2019  . Pulmonary MAI (mycobacterium avium-intracellulare) infection ([place]) 06/24/2019  . History of total right hip replacement 05/27/2019  . NICM (nonischemic cardiomyopathy) ([place]) 05/19/2019  . Educated about COVID-19 virus infection 09/07/2018  . Erythropoietin deficiency anemia 06/19/2018  . Swelling of calf 02/04/2018  . Bad  odor of urine 12/09/2017  . Iron deficiency anemia due to chronic blood loss 08/14/2017  . Iron malabsorption 08/14/2017  . Hyperlipidemia LDL goal <100 07/08/2017  . Elevated ALT measurement   . Hypotension 06/20/2017  . HLD (hyperlipidemia) 06/20/2017  . CAD (coronary artery disease) 06/20/2017  . Protein-calorie malnutrition, severe ([name]) 06/20/2017  . Generalized weakness   . Weakness 05/30/2017  . Focal neurological deficit 05/30/2017  .  Chronic diastolic CHF (congestive heart failure) ([place]) 05/30/2017  . TIA (transient ischemic attack) 05/30/2017  . AKI (acute kidney injury) ([place]) 05/30/2017  . Abnormal LFTs 05/30/2017  . Bronchiectasis ([place]) 04/22/2017  . Right otitis media 07/17/2016  . Cerumen impaction 07/17/2016  . Osteoporosis 03/16/2016  . Headache 11/20/2014  . Skin infection 05/27/2014  . Insomnia 05/27/2014  . Abdominal wall pain 07/09/2013  . Other dysphagia 02/02/2013  . Platelets decreased ([place]) 01/29/2012  . Vitamin D deficiency 12/13/2011  . Hypokalemia 12/13/2011  . Hypocalcemia 12/12/2011  . Adhesions of intestine, very dense, with frozen abdomen 12/11/2011  . SBO (small bowel obstruction), recurrent 12/11/2011  . Protein-calorie malnutrition, moderate ([place]) 12/11/2011  . Oxygen dependent   . Hx of gastric ulcer   . UTI (urinary tract infection) 10/15/2011  . Allergic conjunctivitis 10/15/2011  . Degenerative arthritis of hip 08/03/2011  . B12 deficiency 07/02/2011  . Bilateral leg weakness 01/16/2011  . Anemia, chronic disease 09/14/2010  . Recurrent pneumonia 08/11/2010  . CRT- [employer] 06/26/2010  . DYSURIA 05/19/2010  . Chronic hypoxemic respiratory failure ([place]) 02/21/2010  . DEHYDRATION 01/06/2010  . GASTROENTERITIS 01/06/2010  . Congestive dilated cardiomyopathy ([place]) 11/17/2009  . LBBB 11/17/2009  . SYSTOLIC HEART FAILURE, CHRONIC 11/17/2009  . GERD (gastroesophageal reflux disease) 02/17/2009  . NONSPECIFIC ABN FINDNG RAD&OTH EXAM  BILARY TRCT 09/03/2008  . COPD with emphysema ([place]) 06/28/2008  . DIVERTICULOSIS, COLON 06/03/2008  . GASTRITIS 06/02/2008  . Atrial fibrillation/flutter 12/08/2007  . GASTROPARESIS 08/07/2007  . NAUSEA, CHRONIC 08/07/2007  . Abdominal pain 08/07/2007  . SYNCOPE 02/17/2007  . RESTLESS LEG SYNDROME 08/01/2006  . Chronic pain syndrome 08/01/2006  . OSTEOARTHRITIS 07/31/2006  . Degenerative disc disease, lumbar 07/31/2006  . Fibromyalgia 07/31/2006  . CHRONIC FATIGUE SYNDROME 07/31/2006  . URINARY INCONTINENCE 07/31/2006  . ANEMIA-IRON DEFICIENCY 05/01/2006  . Essential hypertension 05/01/2006    Immunization History  Administered Date(s) Administered  . DTaP 11/03/1996, 01/04/1997, 05/03/1997, 10/19/1997, 08/05/2001  . Fluad Quad(high Dose 65+) 11/06/2018, 12/31/2019  . HPV Quadrivalent 09/05/2009, 09/13/2010, 09/26/2011  . Hepatitis A, Ped/Adol-2 Dose 09/21/2004, 03/29/2005  . Hepatitis B, ped/adol 04/30/1996, 11/03/1996, 10/19/1997  . HiB (PRP-T) 11/03/1996, 01/04/1997, 05/03/1997, 10/19/1997  . IPV 11/03/1996, 01/04/1997, 05/03/1997, 08/05/2001  . Influenza Split 01/16/2011, 12/12/2011  . Influenza Whole 01/15/2007, 12/08/2007, 12/28/2008, 12/17/2009  . Influenza, High Dose Seasonal PF 11/28/2015, 12/07/2016, 11/15/2017  . Influenza, Seasonal, Injecte, Preservative Fre 12/09/2012  . Influenza,Quad,Nasal, Live 03/17/2007, 03/06/2009, 01/08/2010, 01/30/2011, 02/09/2012, 01/18/2013  . Influenza,inj,Quad PF,6+ Mos 03/29/2005, 04/30/2005, 03/01/2006, 01/28/2008, 12/01/2013, 01/16/2014, 12/22/2014, 12/28/2014  . MMR 05/03/1997, 08/05/2001  . Meningococcal B, OMV 12/22/2014  . Pneumococcal Conjugate-13 06/10/2014, 11/15/2014  . Pneumococcal Polysaccharide-23 12/17/2009  . Tdap 09/17/2007, 05/15/2014, 12/09/2018  . Varicella 11/16/1997, 05/18/2008  . Zoster 12/31/2013    Conditions to be addressed/monitored: Atrial Fibrillation, CHF, CAD, HTN, HLD, COPD and osteoporosis; MAI  infection  Care Plan : General Pharmacy (Adult)  Updates made by Cherre Robins, PHARMD since 06/20/2020 12:00 AM    Problem: Afib, COPD, HF, HTN, HLD, CAD, Chronic Pain Syndrome, GERD, Insomnia   Priority: High  Onset Date: 05/10/2020    Long-Range Goal: Patient-Specific Goal   Start Date: 05/10/2020  Expected End Date: 08/07/2020  Recent Progress: On track  Priority: High  Note:   Current Barriers:  . Unable to independently afford treatment regimen   Pharmacist Clinical Goal(s):  [name] Over the next 90 days, patient will verbalize ability to afford treatment regimen . achieve adherence to monitoring guidelines and  medication adherence to achieve therapeutic efficacy . contact provider office for questions/concerns as evidenced notation of same in electronic health record through collaboration with PharmD and provider.    Interventions: . 1:1 collaboration with Carollee Herter, Alferd Apa, DO regarding development and update of comprehensive plan of care as evidenced by provider attestation and co-signature . Inter-disciplinary care team collaboration (see longitudinal plan of care) . Comprehensive medication review performed; medication list updated in electronic medical record  Hypertension (BP goal <140/90) -controlled -Current treatment:  Amlodipine 2.50m daily PM -Medications previously tried: none noted  -Current home readings: checks infrequently, noted BP usually < 140/80 -Educated on BP goals and benefits of medications for prevention of heart attack, stroke and kidney damage; Reminded about the importance of home blood pressure monitoring; -Recommended to continue current medication  Atrial Fibrillation (Goal: prevent stroke and major bleeding) -controlled  -Current treatment:  Anticoagulation: Eliquis 572mtwice daily (per Dr. LoEtter Sjogren-Medications previously tried: amiodarone, carvedilol -Home BP and HR readings: none noted  -Counseled on increased risk of stroke due to Afib  and benefits of anticoagulation for stroke prevention; Reviewed bleeding risk associated with Eliquis and importance of self-monitoring for signs/symptoms of bleeding; Most recent Hgb was 10.4 - slightly downtrend form January 2022 when was 11.4. Continue to monitor CBC every 3 to 6 months and for s/s of bleeding.  -Recommended to continue current anticoagulation therapy -Patient completed PAP application for Eliquis and brought to office 06/17/2020. Reviewed paperwork today. Completed all areas of application. Forwarded to PCP to for review and signature.   COPD with MAI infection (Goal: control symptoms and prevent exacerbations) -controlled -Current treatment   Incruse ellipta 62.12m50mnh once daily (per Sood)  Proventil (per Sood)  Azithromycin 500m92m3 times per week  Ethambutol 1000mg90m times per week  Rifambutin 300mg 212mtimes per week.  -Medications previously tried: none noted  -Gold Grade: Gold 2 (FEV1 50-79%) -Current COPD Classification:  B (high sx, <2 exacerbations/yr) -Pulmonary function testing: 10/10/2011-pulmonary function test-FVC 1.47 (54% predicted), postbronchodilator ratio 64, postbronchodilator FEV1 1.15 (59% predicted), positive bronchodilator response, DLCO 5.3 (35% predicted) -Exacerbations requiring treatment in last 6 months: none -Patient reports consistent use of maintenance inhaler -Frequency of rescue inhaler use: as needed; usually 1 or 2 times per week -Recommended continue current inhalers for COPD and antibiotics / antiinfectives for MAI per ID physicians.  -Patient brought in PAP 06/17/2020. Application reviewed and completed needed areas. Forwarded to PCP to review and provide Rx for Incruse.  -Patient has gotten Proventil from PAP in past but she states she was told Proventil is no longer available in the program. Reviewed her [employer] formulary and generic albuterol inhaler is available to $15/30 days or $30/90 days supply from mail order.  Patient reports this is affordable.  - Patient is taking nitrofurantoin daily for UTI prevention. Consider risk versus benefits in patient with COPD given that nitrofurantoin is associated with pulmonary injury.   Pain (Goal: Minimize symptoms) -controlled -Current treatment   Gabapentin 600mg 438mes daily  Oxycodone 10-3212mg 4 67ms daily  Tizanidine 4mg TID 41mly takes BID)  Acetaminophen 325 2 tabs PRN - uses every other day -Medications previously tried: none noted -Recommended to continue current medication   Patient Goals/Self-Care Activities . Over the next 90 days, patient will:  - take medications as prescribed check blood pressure periodically, document, and provide at future appointments collaborate with provider on medication access solutions  Follow Up Plan: The care management team  will reach out to the patient again over the next 90 days.        Medication Assistance: Application for Incruse and Eliquis  medication assistance program. in process.  Anticipated assistance start date 07/17/2020.  See plan of care for additional detail.  Patient's preferred pharmacy is:  [facility], [address] [place] [address] Phone: [phone] Fax: [phone]  **patient to see Dr Etter Sjogren 07/22/2020. She has been taking low dose nitrofurantoin for UTI prophylaxis. Consider assess risk versus benefits due to potential pulmonary injury from long term nitrofurantoin use**  Follow Up:  Patient agrees to Care Plan and Follow-up.  Plan: Telephone follow up appointment with care management team member scheduled for:  3 months fro CCM review / will check in with patient in 1 mont by phone to see if  has received medications from PAP   Cherre Robins, PharmD Clinical Pharmacist [facility]

## 2020-06-20 NOTE — Patient Instructions (Addendum)
Visit Information  PATIENT GOALS: Goals Addressed            This Visit's Progress   . Chronic Care Management Pharmacy Care Plan       CARE PLAN ENTRY (see longitudinal plan of care for additional care plan information)  Current Barriers:  . Chronic Disease Management support, education, and care coordination needs related to Afib, COPD, HF, HTN, HLD, CAD, Chronic Pain Syndrome, GERD, Insomnia   Hypertension BP Readings from Last 3 Encounters:  01/12/20 (!) 151/82  12/28/19 140/80  12/22/19 (!) 143/77   . Pharmacist Clinical Goal(s): o Over the next 90 days, patient will work with PharmD and providers to maintain BP goal <140/90 . Current regimen:  o Amlodipine 2.5mg  daily PM . Patient self care activities - Over the next 90 days, patient will: o Maintain hypertension medication regimen.  o Check blood pressure at home 2 ot 3 times per week, record and bring to office appointments  Hyperlipidemia Lab Results  Component Value Date/Time   LDLCALC 54 05/11/2019 10:45 AM   . Pharmacist Clinical Goal(s): o Over the next 90 days, patient will work with PharmD and providers to maintain LDL goal < 70 . Current regimen:  o Atorvastatin 20mg  daily . Patient self care activities - Over the next 90 days, patient will: o Maintain cholesterol medication regimen.   COPD . Pharmacist Clinical Goal(s) o Over the next 90 days, patient will work with PharmD and providers to reduce symptoms associated with COPD and reduce barrier to receiving medication . Current regimen:   Incruse ellipta 62.5mg /inh once daily (per [employer])  Proventil (per [place]) . Interventions: o Completed paperwork for Incruse PAP o Reviewed insurance formulary - generic Proventil / albuterol inhaler is $15/ 30 days at [facility] on your plan or $30/90 days by [employer] . Patient self care activities - Over the next 90 days, patient will: o Continue current therapy o Notify clinical pharmacist once you have received  letter from patient assistance program regarding approval or denial for Incruse.   Osteoporosis  . Pharmacist Clinical Goal(s) o Over the next 180 days, patient will work with PharmD and providers to reduce risk of fracture due to osteoporosis  . Current regimen:  o None . Interventions: o Reviewed benefits of Prolia, but will defer until patient has completed MAI treatment and DEXA rechecked . Patient self care activities - Over the next 180 days, patient will: o Consider restarting Prolia pending next bone density results.   Health Maintenance  . Pharmacist Clinical Goal(s) o Over the next year, patient will work with PharmD and providers to complete health maintenance screenings/vaccinations . Interventions: o Discussed consideration of completing Shingrix vaccine series (would wait until completion of MAI abx treatment) o Discussed consideration of completing COVID Vaccine Series . Patient self care activities - Over the next year, patient will: o Consider completion of Shingrix vaccine series (after completion of MAI treatment) o Consider completion of COVID Vaccine series   Medication management . Pharmacist Clinical Goal(s): o Over the next 90 days, patient will work with PharmD and providers to maintain optimal medication adherence . Current pharmacy: [facility]  . Interventions o Comprehensive medication review performed. o Utilize [facility] pharmacy for medication synchronization, packaging and delivery . Patient self care activities - Over the next 90 days, patient will: o Focus on medication adherence by filling and taking medications appropriately  o Take medications as prescribed o Report any questions or concerns to PharmD and/or provider(s)  Please  see past updates related to this goal by clicking on the "Past Updates" button in the selected goal        The patient verbalized understanding of instructions, educational materials, and care plan  provided today and declined offer to receive copy of patient instructions, educational materials, and care plan.   Telephone follow up appointment with care management team member scheduled for: 3 months for CCM / 1 month to follow up PAP status  Cherre Robins, PharmD Clinical Pharmacist [facility] [phone]

## 2020-06-23 ENCOUNTER — Other Ambulatory Visit: Payer: Self-pay

## 2020-06-23 ENCOUNTER — Telehealth: Payer: Self-pay

## 2020-06-23 ENCOUNTER — Telehealth (INDEPENDENT_AMBULATORY_CARE_PROVIDER_SITE_OTHER): Payer: HMO | Admitting: Internal Medicine

## 2020-06-23 VITALS — BP 140/90 | Ht 60.0 in | Wt 88.0 lb

## 2020-06-23 DIAGNOSIS — I480 Paroxysmal atrial fibrillation: Secondary | ICD-10-CM

## 2020-06-23 DIAGNOSIS — I428 Other cardiomyopathies: Secondary | ICD-10-CM

## 2020-06-23 DIAGNOSIS — Z950 Presence of cardiac pacemaker: Secondary | ICD-10-CM | POA: Diagnosis not present

## 2020-06-23 DIAGNOSIS — I495 Sick sinus syndrome: Secondary | ICD-10-CM

## 2020-06-23 DIAGNOSIS — I5032 Chronic diastolic (congestive) heart failure: Secondary | ICD-10-CM

## 2020-06-23 LAB — CUP PACEART REMOTE DEVICE CHECK
Battery Remaining Longevity: 107 mo
Battery Voltage: 3.02 V
Brady Statistic AP VP Percent: 0.5 %
Brady Statistic AP VS Percent: 0.01 %
Brady Statistic AS VP Percent: 99.16 %
Brady Statistic AS VS Percent: 0.33 %
Brady Statistic RA Percent Paced: 0.57 %
Brady Statistic RV Percent Paced: 99.66 %
Date Time Interrogation Session: 20220401032748
Implantable Lead Implant Date: 20111005
Implantable Lead Implant Date: 20111005
Implantable Lead Implant Date: 20111005
Implantable Lead Location: 753858
Implantable Lead Location: 753859
Implantable Lead Location: 753860
Implantable Lead Model: 4196
Implantable Lead Model: 5076
Implantable Lead Model: 5076
Implantable Pulse Generator Implant Date: 20210402
Lead Channel Impedance Value: 266 Ohm
Lead Channel Impedance Value: 323 Ohm
Lead Channel Impedance Value: 342 Ohm
Lead Channel Impedance Value: 380 Ohm
Lead Channel Impedance Value: 380 Ohm
Lead Channel Impedance Value: 418 Ohm
Lead Channel Impedance Value: 437 Ohm
Lead Channel Impedance Value: 456 Ohm
Lead Channel Impedance Value: 627 Ohm
Lead Channel Pacing Threshold Amplitude: 0.375 V
Lead Channel Pacing Threshold Amplitude: 0.625 V
Lead Channel Pacing Threshold Pulse Width: 0.4 ms
Lead Channel Pacing Threshold Pulse Width: 0.4 ms
Lead Channel Sensing Intrinsic Amplitude: 0.625 mV
Lead Channel Sensing Intrinsic Amplitude: 0.625 mV
Lead Channel Sensing Intrinsic Amplitude: 20.375 mV
Lead Channel Sensing Intrinsic Amplitude: 20.375 mV
Lead Channel Setting Pacing Amplitude: 1.25 V
Lead Channel Setting Pacing Amplitude: 2 V
Lead Channel Setting Pacing Amplitude: 2.5 V
Lead Channel Setting Pacing Pulse Width: 0.4 ms
Lead Channel Setting Pacing Pulse Width: 0.8 ms
Lead Channel Setting Sensing Sensitivity: 0.9 mV

## 2020-06-23 NOTE — Progress Notes (Signed)
Electrophysiology TeleHealth Note   Due to national recommendations of social distancing due to COVID 19, an audio/video telehealth visit is felt to be most appropriate for this patient at this time.  See MyChart message from today for the patient's consent to telehealth for [name].   Date:  06/23/2020   ID:  [name], DOB 04-06-1945, MRN [id number]  Location: patient's home  Provider location: [address], [place]  Evaluation Performed: Follow-up visit  PCP:  Ann Held, DO  Cardiologist:   [facility] Electrophysiologist:  SK   Chief Complaint: chf   History of Present Illness:    [name] is a 75 y.o. female who presents via audio/video conferencing for a telehealth visit today.  Since last being seen in our clinic for congestive heart failure in the setting of nonischemic cardiomyopathy w? CRT-P implantation 2011. Generator replacement 2021.  CRT assoc w  significant improvement although complicated by left upper extremity deep venous thrombosis.   History of atrial arrhythmias for which she was previously  treated with amiodarone now discontinued; O2 dependent COPD, the patient reports that her functional limitations, primarily SOB is stable and she is on  A little higher O2 (3<<2L) no chest pain or edema  Therapy for pulmonary MAI as an explanation for recurrent pneumonia-- she is interested in stopping this   DATE TEST EF   12/09 Myoview   34 %   3/16 Echo   60-65 %   4/21 TEE  60-65%         Date Cr K Hgb  1/19 1.01 4.5 10.6   6/21 0.92 4.8 9.6  3/22 0.85 4.1 10.4<<11.2      The patient denies symptoms of fevers, chills, cough, or new SOB worrisome for COVID 19.    Past Medical History:  Diagnosis Date  . Anemia   . Arthritis   . Atrial fibrillation ([name])   . CAD (coronary artery disease)    Mild disease per cath in 2011  . COPD (chronic obstructive pulmonary disease) ([place])    Home O2  . Diverticulitis   .  Elbow fracture, left aug 2012  . Erythropoietin deficiency anemia 06/19/2018  . Essential hypertension   . Fibromyalgia   . Gastroparesis   . GERD (gastroesophageal reflux disease)   . H/O: GI bleed    from Pradaxa  . Hepatitis    Hx of in high school   . History of pneumonia    Recurrent  . Hx MRSA infection   . Hx of gastric ulcer   . Hx of stress fx 10/2009   Right hip   . Iron malabsorption 08/14/2017  . LBBB (left bundle branch block)   . Oxygen dependent    2 liters via nasal cannula at all times  . Pacemaker    CRT therapy; followed by Dr. Caryl Comes  . Pleural effusion     s/p right thoracentesis 03/09  . Primary dilated cardiomyopathy (HCC)    EF 45 to 50% per echo in Jan 2012  . RLS (restless legs syndrome)   . Silent aspiration   . Small bowel obstruction ([name])   . Urinary incontinence   . Venous embolism and thrombosis of subclavian vein [facility])    After pacemaker insertion Oct 2011  . Vitamin B12 deficiency   . Wears glasses    Reading    Past Surgical History:  Procedure Laterality Date  . APPENDECTOMY    . BIV  PACEMAKER GENERATOR CHANGEOUT N/A 06/19/2019   Procedure: BIV PACEMAKER GENERATOR CHANGEOUT;  Surgeon: Deboraha Sprang, MD;  Location: [facility];  Service: Cardiovascular;  Laterality: N/A;  . BRONCHIAL WASHINGS  05/25/2019   Procedure: BRONCHIAL WASHINGS;  Surgeon: Chesley Mires, MD;  Location: [facility];  Service: Endoscopy;;  . CARDIAC CATHETERIZATION  04/08/2006, 11/16/2009  . CHOLECYSTECTOMY N/A 06/27/2017   Procedure: OPEN CHOLECYSTECTOMY;  Surgeon: Ralene Ok, MD;  Location: [facility];  Service: General;  Laterality: N/A;  . COLECTOMY     Intestinal resection (5times)  . COLECTOMY  02/04/2000   ex. lap., intra-abd. subtotal colectomy with ileosigmoid colon anastomosies and lysis of adhesions  . CYSTOSCOPY  11/11/2008  . CYSTOSCOPY W/ RETROGRADES  11/11/2008   right  . CYSTOSCOPY WITH INJECTION  04/30/2006   transurethral collagen injection;  incision vaginal stenosis  . ERCP N/A 06/23/2017   Procedure: ENDOSCOPIC RETROGRADE CHOLANGIOPANCREATOGRAPHY (ERCP);  Surgeon: Carol Ada, MD;  Location: [facility];  Service: Endoscopy;  Laterality: N/A;  . ERCP N/A 06/24/2017   Procedure: ENDOSCOPIC RETROGRADE CHOLANGIOPANCREATOGRAPHY (ERCP);  Surgeon: [name], MD;  Location: [facility];  Service: Endoscopy;  Laterality: N/A;  . ESOPHAGOGASTRODUODENOSCOPY N/A 02/02/2013   Procedure: ESOPHAGOGASTRODUODENOSCOPY (EGD);  Surgeon: Lafayette Dragon, MD;  Location: [facility];  Service: Endoscopy;  Laterality: N/A;  . EXPLORATORY LAPAROTOMY  04/27/2009   lysis of adhesions, gastrostomy tube  . GASTROCUTANEOUS FISTULA CLOSURE    . INTERSTIM IMPLANT PLACEMENT  05/28/2006 - stage I   06/05/2006 - stage II  . INTERSTIM IMPLANT PLACEMENT  02/05/2012   Procedure: [name] IMPLANT FIRST STAGE;  Surgeon: Reece Packer, MD;  [facility];  Service: Urology;  Laterality: N/A;  Replacement of Interstim Lead     . INTERSTIM IMPLANT REVISION  03/06/2011   Procedure: REVISION OF [name];  Surgeon: Reece Packer, MD;  [facility];  Service: Urology;  Laterality: N/A;  Replacement of Neurostimulator  . INTERSTIM IMPLANT REVISION  10/23/2007  . IR IMAGING GUIDED PORT INSERTION  10/14/2017  . PACEMAKER PLACEMENT  12/21/2009  . Peg removed     with complications  . PORT-A-CATH REMOVAL  07/27/2011   Procedure: REMOVAL PORT-A-CATH;  Surgeon: Rolm Bookbinder, MD;  Location: [facility];  Service: General;  Laterality: N/A;  . PORTACATH PLACEMENT  12/16/2009  . SAVORY DILATION N/A 02/02/2013   Procedure: SAVORY DILATION;  Surgeon: Lafayette Dragon, MD;  [facility];  Service: Endoscopy;  Laterality: N/A;  . SMALL INTESTINE SURGERY  05/20/2001   ex. lap., resection of small bowel stricture; gastrostomy; insertion central line  . TEE WITHOUT CARDIOVERSION N/A 07/17/2019   Procedure: TRANSESOPHAGEAL ECHOCARDIOGRAM (TEE);   Surgeon: Donato Heinz, MD;  Location: [facility];  Service: Cardiovascular;  Laterality: N/A;  . TONSILLECTOMY  age 88  . TOTAL ABDOMINAL HYSTERECTOMY     complete  . TOTAL HIP ARTHROPLASTY  08/03/2011   Procedure: TOTAL HIP ARTHROPLASTY ANTERIOR APPROACH;  Surgeon: Mcarthur Rossetti, MD;  [facility];  Service: Orthopedics;  Laterality: Right;  [name] VIDEO BRONCHOSCOPY N/A 05/25/2019   Procedure: VIDEO BRONCHOSCOPY WITHOUT FLUORO;  Surgeon: Chesley Mires, MD;  [facility];  Service: Endoscopy;  Laterality: N/A;    Current Outpatient Medications  Medication Sig Dispense Refill  . acetaminophen (TYLENOL) 650 MG CR tablet Take 650 mg by mouth every 8 (eight) hours as needed for pain.    [name] albuterol (VENTOLIN HFA) 108 (90 Base) MCG/ACT inhaler Inhale 2 puffs into the  lungs every 6 (six) hours as needed for wheezing or shortness of breath.    [name] amLODipine (NORVASC) 2.5 MG tablet TAKE 1 TABLET BY MOUTH AT BEDTIME 90 tablet 0  . azithromycin (ZITHROMAX) 500 MG tablet TAKE 1 TABLETS BY MOUTH THREE TIMES A WEEK 12 tablet 0  . ELIQUIS 5 MG TABS tablet Take 1 tablet by mouth twice daily 180 tablet 1  . ethambutol (MYAMBUTOL) 100 MG tablet TAKE 2 TABLETS BY MOUTH THREE TIMES A WEEK. TAKE WITH 2 TABLETS OF 400 MG OF ETHAMBUTOL TO EQUAL 1000 MG TOTAL 24 tablet 0  . ethambutol (MYAMBUTOL) 400 MG tablet TAKE 2 TABLETS BY MOUTH THREE TIMES A WEEK **TAKE WITH 2 TABLETS OF 100 MG OF ETHAMBUTOL TO EQUAL 1000 MG TOTAL** 24 tablet 0  . furosemide (LASIX) 40 MG tablet TAKE 1 TABLET BY MOUTH IN THE MORNING 90 tablet 0  . gabapentin (NEURONTIN) 600 MG tablet 1 po qid 120 tablet 5  . lidocaine-prilocaine (EMLA) cream Apply 1 application topically as needed. (Patient taking differently: Apply 1 application topically as needed (for port access- to numb).) 30 g 6  . nitrofurantoin (MACRODANTIN) 100 MG capsule Take 100 mg by mouth daily.    . nitroGLYCERIN (NITROSTAT) 0.4 MG SL tablet Place 1  tablet (0.4 mg total) under the tongue every 5 (five) minutes as needed. CHEST PAIN 25 tablet 6  . oxyCODONE-acetaminophen (PERCOCET) 10-325 MG tablet Take 1 tablet by mouth every 4 (four) hours as needed. PAIN 120 tablet 0  . OXYGEN Inhale 3 L/min into the lungs continuous.     . pantoprazole (PROTONIX) 40 MG tablet Take 1 tablet by mouth twice daily 180 tablet 0  . rifabutin (MYCOBUTIN) 150 MG capsule TAKE 2 CAPSULES (300 MG TOTAL) BY MOUTH THREE TIMES A WEEK 24 capsule 0  . tiZANidine (ZANAFLEX) 4 MG tablet Take 1 tablet (4 mg total) by mouth 3 (three) times daily as needed for muscle spasms. 90 tablet 3  . triamcinolone ointment (KENALOG) 0.1 % Apply 1 application topically daily as needed (to affected areas- for psoriasis).     [name] umeclidinium bromide (INCRUSE ELLIPTA) 62.5 MCG/INH AEPB Inhale 1 puff into the lungs daily. 30 each 3  . zolpidem (AMBIEN) 5 MG tablet TAKE 1 TABLET BY MOUTH EVERY DAY AT BEDTIME AS NEEDED 30 tablet 0  . atorvastatin (LIPITOR) 20 MG tablet TAKE 1 TABLET BY MOUTH ONCE DAILY AT  6PM (Patient not taking: Reported on 06/23/2020) 90 tablet 1   No current facility-administered medications for this visit.    Allergies:   Dabigatran etexilate mesylate, Augmentin [amoxicillin-pot clavulanate], and Talwin [pentazocine]  ROS:  Please see the history of present illness.   All other systems are personally reviewed and negative.    Exam:    Vital Signs:  BP 140/90   Ht 5' (1.524 m)   Wt 88 lb (39.9 kg)   BMI 17.19 kg/m     Well appearing, alert and conversant, regular work of breathing  On O2 ,  good skin color Eyes- anicteric, neuro- grossly intact, skin- no apparent rash or lesions or cyanosis, mouth- oral mucosa is pink   Labs/Other Tests and Data Reviewed:    Recent Labs: 07/20/2019: TSH 7.708 07/23/2019: Magnesium 1.9 06/06/2020: ALT 52; BUN 16; Creatinine 0.85; Hemoglobin 10.4; Platelet Count 174; Potassium 4.1; Sodium 138   Wt Readings from Last 3 Encounters:   06/23/20 88 lb (39.9 kg)  05/09/20 87 lb 14.4 oz (39.9 kg)  04/25/20 84 lb  6.4 oz (38.3 kg)     Other studies personally reviewed: Additional studies/ records that were reviewed today include (As above)      Last device remote is reviewed from [facility] PDF dated 10/21* which reveals normal device function,   arrhythmias - nonsustained atrial tach   ASSESSMENT & PLAN:   Nonischemic cardiomyopathy interval resolution  Congestive heart failure diastolic     Atrial arrhythmia    Pacemaker-CRT-[employer]tronic     Anemia chronic   Hypertension  Functional status is stable  Continue current meds.  Her BP is high and consistently so; have reached out to her PCP to increase amlodipine 2.5>>12  Remotes not being received..>> reached out to device clinic to reestablish connectivity      COVID 19 screen The patient denies symptoms of COVID 19 at this time.  The importance of social distancing was discussed today.  Follow-up:  20 m Next remote: As Scheduled   Current medicines are reviewed at length with the patient today.   The patient does not have concerns regarding her medicines.  The following changes were made today:  none  Labs/ tests ordered today include:   No orders of the defined types were placed in this encounter.    Patient Risk:  after full review of this patients clinical status, I feel that they are at moderate risk at this time.  Today, I have spent 11 minutes with the patient with telehealth technology discussing the above.  Signed, Virl Axe, MD  06/23/2020 2:18 PM     [address] [place] [address] [phone] (office) [phone] (fax)

## 2020-06-23 NOTE — Telephone Encounter (Signed)
I spoke with the patient and realized her monitor was incorrect in [employer]. I got the patient model/serial number and fixed it in [facility]. We can now see her transmissions.

## 2020-06-26 NOTE — Patient Instructions (Signed)
Medication Instructions:  °Your physician recommends that you continue on your current medications as directed. Please refer to the Current Medication list given to you today. ° °*If you need a refill on your cardiac medications before your next appointment, please call your pharmacy* ° ° °Lab Work: °None ordered. ° °If you have labs (blood work) drawn today and your tests are completely normal, you will receive your results only by: °MyChart Message (if you have MyChart) OR °A paper copy in the mail °If you have any lab test that is abnormal or we need to change your treatment, we will call you to review the results. ° ° °Testing/Procedures: °None ordered. ° ° ° °Follow-Up: °At [facility], you and your health needs are our priority.  As part of our continuing mission to provide you with exceptional heart care, we have created designated Provider Care Teams.  These Care Teams include your primary Cardiologist (physician) and Advanced Practice Providers (APPs -  Physician Assistants and Nurse Practitioners) who all work together to provide you with the care you need, when you need it. ° °We recommend signing up for the patient portal called "MyChart".  Sign up information is provided on this After Visit Summary.  MyChart is used to connect with patients for Virtual Visits (Telemedicine).  Patients are able to view lab/test results, encounter notes, upcoming appointments, etc.  Non-urgent messages can be sent to your provider as well.   °To learn more about what you can do with MyChart, go to [email or web address].   ° °Your next appointment:   °12 months with Dr Klein °

## 2020-06-28 ENCOUNTER — Ambulatory Visit: Payer: HMO | Admitting: Pharmacist

## 2020-06-28 DIAGNOSIS — I480 Paroxysmal atrial fibrillation: Secondary | ICD-10-CM

## 2020-06-28 DIAGNOSIS — J439 Emphysema, unspecified: Secondary | ICD-10-CM | POA: Diagnosis not present

## 2020-06-28 DIAGNOSIS — I10 Essential (primary) hypertension: Secondary | ICD-10-CM

## 2020-06-28 NOTE — Patient Instructions (Signed)
Visit Information  PATIENT GOALS: Goals Addressed            This Visit's Progress   . Chronic Care Management Pharmacy Care Plan   On track    CARE PLAN ENTRY (see longitudinal plan of care for additional care plan information)  Current Barriers:  . Chronic Disease Management support, education, and care coordination needs related to Afib, COPD, HF, HTN, HLD, CAD, Chronic Pain Syndrome, GERD, Insomnia   Hypertension BP Readings from Last 3 Encounters:  06/23/20 140/90  06/06/20 (!) 141/65  05/09/20 (!) 161/88   . Pharmacist Clinical Goal(s): o Over the next 90 days, patient will work with PharmD and providers to achieve BP goal <140/90 . Current regimen:  o Amlodipine 2.5mg  daily each evening . Intervention:  o If BP remains >140/90 when checked at appt in May 2022; consider increasing amlodipine to 5mg  daily. . Patient self care activities - Over the next 90 days, patient will: o Maintain hypertension medication regimen.  o Check blood pressure at home 2 ot 3 times per week, record and bring to office appointments  Atrial Fibrillation:  . Pharmacist Clinical Goal(s): o Over the next 90 days, patient will work with PharmD and providers to improve access to Eliquis (patient is in Medicare coverage gap) . Current regimen:  o Eliquis 5mg  twice a day . Intervention:  o Coordination of completion of patient assistance application for Eliquis.  o Contacted patient's husband to bring in financial documentation needed . Patient self care activities - Over the next 90 days, patient will: o Maintain Eliquis therapy o Notify office once you receive letter with decision for patient assistance for Eliquis.    Hyperlipidemia:   Lab Results  Component Value Date/Time   LDLCALC 54 05/11/2019 10:45 AM   . Pharmacist Clinical Goal(s): o Over the next 90 days, patient will work with PharmD and providers to maintain LDL goal < 70 . Current regimen:  o Atorvastatin 20mg   daily . Patient self care activities - Over the next 90 days, patient will: o Maintain cholesterol medication regimen.   COPD . Pharmacist Clinical Goal(s) o Over the next 90 days, patient will work with PharmD and providers to reduce symptoms associated with COPD and reduce barrier to receiving medication . Current regimen:   Incruse ellipta 62.5mg /inh once daily   Proventil  . Interventions: o Requested updated 90 day supply Rx from Dr Etter Sjogren for [place] - generic Proventil / albuterol inhaler is $15/ 30 days at [facility] on your plan or $30/90 days by [employer] . Patient self care activities - Over the next 90 days, patient will: o Continue current therapy o Notify clinical pharmacist once you have received letter from patient assistance program regarding approval or denial for Incruse.   Osteoporosis  . Pharmacist Clinical Goal(s) o Over the next 180 days, patient will work with PharmD and providers to reduce risk of fracture due to osteoporosis  . Current regimen:  o None . Interventions: o Reviewed benefits of Prolia, but will defer until patient has completed MAI treatment and DEXA rechecked . Patient self care activities - Over the next 180 days, patient will: o Consider restarting Prolia pending next bone density results.   Health Maintenance  . Pharmacist Clinical Goal(s) o Over the next year, patient will work with PharmD and providers to complete health maintenance screenings/vaccinations . Interventions: o Discussed consideration of completing Shingrix vaccine series (would wait until completion of MAI abx treatment) o Discussed  consideration of completing COVID Vaccine Series . Patient self care activities - Over the next year, patient will: o Consider completion of Shingrix vaccine series (after completion of MAI treatment) o Consider completion of COVID Vaccine series   Medication management . Pharmacist Clinical Goal(s): o Over  the next 90 days, patient will work with PharmD and providers to maintain optimal medication adherence . Current pharmacy: [facility]  . Interventions o Comprehensive medication review performed. o Reviewed patient assistance applications for Eliquis and Incruse. Need financial documents to complete Eliquis application and updated Rx for Incruse for 90 day supply. Financial documents requested from patient's husband and plan to bring to office this week. Coordinated new Rx for Incruse with Dr Etter Sjogren.  . Patient self care activities - Over the next 90 days, patient will: o Focus on medication adherence by filling and taking medications appropriately  o Take medications as prescribed o Report any questions or concerns to PharmD and/or provider(s) o Bring in financial documentation for Eliquis patient assistance application  Please see past updates related to this goal by clicking on the "Past Updates" button in the selected goal        The patient verbalized understanding of instructions, educational materials, and care plan provided today and declined offer to receive copy of patient instructions, educational materials, and care plan.   Telephone follow up appointment with care management team member scheduled for: 1 month to check on PAP applications and 3 months for CCM reassessment of chronic conditions.   Cherre Robins, PharmD Clinical Pharmacist [facility] [phone]

## 2020-06-28 NOTE — Progress Notes (Signed)
Remote pacemaker transmission.   

## 2020-06-28 NOTE — Chronic Care Management (AMB) (Signed)
Chronic Care Management Pharmacy Note  06/28/2020 Name:  [name] MRN:  [id number] DOB:  1945-06-11  Subjective: [name] is an 75 y.o. year old female who is a primary patient of Ann Held, DO.  The CCM team was consulted for assistance with disease management and care coordination needs.  CCM team is assisting patient to apply for PAP for Incruse and Eliquis.  Engaged with patient by telephone for follow up visit in response to provider referral for pharmacy case management and/or care coordination services.   Consent to Services:  The patient was given information about Chronic Care Management services, agreed to services, and gave verbal consent prior to initiation of services.  Please see initial visit note for detailed documentation.   Patient Care Team: Carollee Herter, Alferd Apa, DO as PCP - General Minus Breeding, MD as PCP - Cardiology (Cardiology) Deboraha Sprang, MD as PCP - Electrophysiology (Cardiology) Rolm Bookbinder, MD as Consulting Physician (General Surgery) Lafayette Dragon, MD (Inactive) as Consulting Physician (Gastroenterology) Gatha Mayer, MD as Consulting Physician (Gastroenterology) Rutherford Guys, MD as Consulting Physician (Ophthalmology) Crista Luria, MD as Consulting Physician (Dermatology) Deboraha Sprang, MD as Consulting Physician (Cardiology) Minus Breeding, MD as Consulting Physician (Cardiology) Cherre Robins, PharmD (Pharmacist)  Recent office visits: 04/25/2020 - PCP (Dr Etter Sjogren) F/U chronic pain / fibromyalgia. No medication changes made.  Recent consult visits: 06/23/2020 - Cardio (Dr Caryl Comes) Video Visit for f/u CHF with CRT-Pacemaker implantation in 2011 and replacement in 2021. Atrial arrhythmias and h/o upper extremity DVT - on Eliquis. Noted BP elevated. Dr Jens Som planned to send message to PCP to discuss increasing amlodipine 2.62m daily. No change in dose noted as of 4/1 Also noted remote reading for pacemaker not  being recorded. Device clinic notified to reestablish connectivity  06/06/2020 - received iron infusion  05/11/2020 - ID (Dr SBaxter Flattery video visit for MAI infection. Recommendations: mucinex. Continue TIW treatment for MAC. Sputum cx for afb cx to document clearance. RTC 3 months  05/09/2020 - hematology (Dr CVergia Alcon f/u iron def anemia and EPO deficient anemia; Currently receiving  IV iron as indicated and Retacrit 40,000 units SQfor hgb <11; HGB ws 11.8 and no ESA needed at this visit. Dr CVergia Alconto continue to check labs and replace iron as needed.   Hospital visits: None in previous 6 months  Objective:  Lab Results  Component Value Date   CREATININE 0.85 06/06/2020   CREATININE 0.79 05/09/2020   CREATININE 0.79 04/11/2020    Lab Results  Component Value Date   HGBA1C 5.2 05/31/2017       Component Value Date/Time   CHOL 102 05/11/2019 1045   TRIG 97 05/11/2019 1045   TRIG 76 03/11/2006 1059   HDL 29 (L) 05/11/2019 1045   CHOLHDL 3.5 05/11/2019 1045   VLDL 19 05/11/2019 1045   LDLCALC 54 05/11/2019 1045    Hepatic Function Latest Ref Rng & Units 06/06/2020 05/09/2020 04/11/2020  Total Protein 6.5 - 8.1 g/dL 6.9 7.5 7.3  Albumin 3.5 - 5.0 g/dL 4.2 4.6 4.1  AST 15 - 41 U/L 54(H) 19 15  ALT 0 - 44 U/L 52(H) 15 6  Alk Phosphatase 38 - 126 U/L 202(H) 138(H) 119  Total Bilirubin 0.3 - 1.2 mg/dL 0.4 0.4 0.4  Bilirubin, Direct 0.0 - 0.3 mg/dL - - -    Lab Results  Component Value Date/Time   TSH 7.708 (H) 07/20/2019 08:05 PM   TSH 4.43 06/26/2010 01:55  PM   TSH 1.05 02/15/2009 10:23 AM   FREET4 0.68 07/20/2019 08:30 PM    CBC Latest Ref Rng & Units 06/06/2020 05/09/2020 04/11/2020  WBC 4.0 - 10.5 K/uL 4.5 6.3 7.4  Hemoglobin 12.0 - 15.0 g/dL 10.4(L) 11.8(L) 10.2(L)  Hematocrit 36.0 - 46.0 % 32.3(L) 37.1 31.8(L)  Platelets 150 - 400 K/uL 174 214 277    Lab Results  Component Value Date/Time   VD25OH 16.46 (L) 09/06/2015 02:16 PM   VD25OH 4.8 (L) 05/20/2014  03:33 PM   VD25OH 15 (L) 12/12/2011 06:45 PM    Clinical ASCVD: Yes  The ASCVD Risk score Mikey Bussing DC Jr., et al., 2013) failed to calculate for the following reasons:   The valid total cholesterol range is 130 to 320 mg/dL     Social History   Tobacco Use  Smoking Status Former Smoker  . Packs/day: 1.00  . Years: 20.00  . Pack years: 20.00  . Quit date: 03/19/1986  . Years since quitting: 34.3  Smokeless Tobacco Never Used   BP Readings from Last 3 Encounters:  06/23/20 140/90  06/06/20 (!) 141/65  05/09/20 (!) 161/88   Pulse Readings from Last 3 Encounters:  06/06/20 82  05/09/20 92  04/25/20 92   Wt Readings from Last 3 Encounters:  06/23/20 88 lb (39.9 kg)  05/09/20 87 lb 14.4 oz (39.9 kg)  04/25/20 84 lb 6.4 oz (38.3 kg)    Assessment: Review of patient past medical history, allergies, medications, health status, including review of consultants reports, laboratory and other test data, was performed as part of comprehensive evaluation and provision of chronic care management services.   SDOH:  (Social Determinants of Health) assessments and interventions performed:  SDOH Interventions   Flowsheet Row Most Recent Value  SDOH Interventions   Financial Strain Interventions Other (Comment)  [Currently assisting patient in applications for Incruse and Eliquis]  Housing Interventions Intervention Not Indicated      CCM Care Plan  Allergies  Allergen Reactions  . Dabigatran Etexilate Mesylate Other (See Comments)    INTERNAL BLEEDING- Pradaxa  . Augmentin [Amoxicillin-Pot Clavulanate] Diarrhea and Nausea Only    Did it involve swelling of the face/tongue/throat, SOB, or low BP? No Did it involve sudden or severe rash/hives, skin peeling, or any reaction on the inside of your mouth or nose? No Did you need to seek medical attention at a hospital or doctor's office? No When did it last happen?02/2019 If all above answers are "NO", may proceed with cephalosporin use.    [name] [Pentazocine] Other (See Comments)    Hallucinations     Medications Reviewed Today    Reviewed by Cherre Robins, PharmD (Pharmacist) on 06/28/20 at 1136  Med List Status: <None>  Medication Order Taking? Sig Documenting Provider Last Dose Status Informant  acetaminophen (TYLENOL) 650 MG CR tablet [id number] Yes Take 650 mg by mouth every 8 (eight) hours as needed for pain. [provider] Taking Active Spouse/Significant Other  albuterol (VENTOLIN HFA) 108 (90 Base) MCG/ACT inhaler [id number] Yes Inhale 2 puffs into the lungs every 6 (six) hours as needed for wheezing or shortness of breath. [provider] Taking Active Spouse/Significant Other           Med Note Antony Contras, Ebbie Cherry B   Mon Jun 20, 2020 11:19 AM) No longer getting from PAP - now available generically  amLODipine (NORVASC) 2.5 MG tablet [id number] Yes TAKE 1 TABLET BY MOUTH AT BEDTIME Minus Breeding, MD Taking Active Spouse/Significant Other  atorvastatin (LIPITOR) 20 MG tablet [id number] Yes TAKE 1 TABLET BY MOUTH ONCE DAILY AT  6PM  Patient taking differently: TAKE 1 TABLET BY MOUTH ONCE DAILY AT  6PM   Roma Schanz R, DO Taking Active Spouse/Significant Other  azithromycin (ZITHROMAX) 500 MG tablet [id number] Yes TAKE 1 TABLETS BY MOUTH THREE TIMES A Marquette Old, MD Taking Active Spouse/Significant Other  ELIQUIS 5 MG TABS tablet [id number] Yes Take 1 tablet by mouth twice daily Carollee Herter, Kendrick Fries R, DO Taking Active Spouse/Significant Other  ethambutol (MYAMBUTOL) 100 MG tablet [id number] Yes TAKE 2 TABLETS BY MOUTH THREE TIMES A WEEK. TAKE WITH 2 TABLETS OF 400 MG OF ETHAMBUTOL TO EQUAL 1000 MG TOTAL Carlyle Basques, MD Taking Active Spouse/Significant Other  ethambutol (MYAMBUTOL) 400 MG tablet [id number] Yes TAKE 2 TABLETS BY MOUTH THREE TIMES A WEEK **TAKE WITH 2 TABLETS OF 100 MG OF ETHAMBUTOL TO EQUAL 1000 MG TOTALCarlyle Basques, MD Taking Active Spouse/Significant Other  furosemide  (LASIX) 40 MG tablet [id number] Yes TAKE 1 TABLET BY MOUTH IN THE [name], MD Taking Active Spouse/Significant Other  gabapentin (NEURONTIN) 600 MG tablet [id number] Yes 1 po qid Roma Schanz R, DO Taking Active Spouse/Significant Other        Discontinued 06/05/11 1433 (Error)   lidocaine-prilocaine (EMLA) cream [id number] Yes Apply 1 application topically as needed.  Patient taking differently: Apply 1 application topically as needed (for port access- to numb).   Volanda Napoleon, MD Taking Active Spouse/Significant Other  nitrofurantoin (MACRODANTIN) 100 MG capsule [id number] Yes Take 100 mg by mouth daily. [provider] Taking Active Spouse/Significant Other  nitroGLYCERIN (NITROSTAT) 0.4 MG SL tablet [id number] Yes Place 1 tablet (0.4 mg total) under the tongue every 5 (five) minutes as needed. CHEST PAIN Minus Breeding, MD Taking Active Spouse/Significant Other  oxyCODONE-acetaminophen (PERCOCET) 10-325 MG tablet [id number] Yes Take 1 tablet by mouth every 4 (four) hours as needed. PAIN Ann Held, DO Taking Active Spouse/Significant Other  OXYGEN [id number] Yes Inhale 3 L/min into the lungs continuous.  [provider] Taking Active Spouse/Significant Other  pantoprazole (PROTONIX) 40 MG tablet [id number] Yes Take 1 tablet by mouth twice daily Gatha Mayer, MD Taking Active Spouse/Significant Other  rifabutin (MYCOBUTIN) 150 MG capsule [id number] Yes TAKE 2 CAPSULES (300 MG TOTAL) BY MOUTH THREE TIMES A Marquette Old, MD Taking Active Spouse/Significant Other  tiZANidine (ZANAFLEX) 4 MG tablet [id number] Yes Take 1 tablet (4 mg total) by mouth 3 (three) times daily as needed for muscle spasms. Ann Held, DO Taking Active Spouse/Significant Other  triamcinolone ointment (KENALOG) 0.1 % [id number] Yes Apply 1 application topically daily as needed (to affected areas- for psoriasis).  [provider] Taking Active  Spouse/Significant Other  umeclidinium bromide (INCRUSE ELLIPTA) 62.5 MCG/INH AEPB [id number] Yes Inhale 1 puff into the lungs daily. Roma Schanz R, DO Taking Active Spouse/Significant Other  zolpidem (AMBIEN) 5 MG tablet [id number] Yes TAKE 1 TABLET BY MOUTH EVERY DAY AT BEDTIME AS NEEDED Ann Held, DO Taking Active Spouse/Significant Other          Patient Active Problem List   Diagnosis Date Noted  . Hematoma 08/07/2019  . Other neutropenia ([place])   . Post-procedural fever 07/20/2019  . Acute febrile illness 07/16/2019  . Pulmonary MAI (mycobacterium avium-intracellulare) infection ([place]) 06/24/2019  . History of total right hip replacement 05/27/2019  . NICM (nonischemic cardiomyopathy) ([place]) 05/19/2019  . Educated about COVID-19 virus infection 09/07/2018  .  Erythropoietin deficiency anemia 06/19/2018  . Swelling of calf 02/04/2018  . Bad odor of urine 12/09/2017  . Iron deficiency anemia due to chronic blood loss 08/14/2017  . Iron malabsorption 08/14/2017  . Hyperlipidemia LDL goal <100 07/08/2017  . Elevated ALT measurement   . Hypotension 06/20/2017  . HLD (hyperlipidemia) 06/20/2017  . CAD (coronary artery disease) 06/20/2017  . Protein-calorie malnutrition, severe ([place]) 06/20/2017  . Generalized weakness   . Weakness 05/30/2017  . Focal neurological deficit 05/30/2017  . Chronic diastolic CHF (congestive heart failure) ([place]) 05/30/2017  . TIA (transient ischemic attack) 05/30/2017  . AKI (acute kidney injury) ([place]) 05/30/2017  . Abnormal LFTs 05/30/2017  . Bronchiectasis ([place]) 04/22/2017  . Right otitis media 07/17/2016  . Cerumen impaction 07/17/2016  . Osteoporosis 03/16/2016  . Headache 11/20/2014  . Skin infection 05/27/2014  . Insomnia 05/27/2014  . Abdominal wall pain 07/09/2013  . Other dysphagia 02/02/2013  . Platelets decreased ([place]) 01/29/2012  . Vitamin D deficiency 12/13/2011  . Hypokalemia 12/13/2011  . Hypocalcemia 12/12/2011   . Adhesions of intestine, very dense, with frozen abdomen 12/11/2011  . SBO (small bowel obstruction), recurrent 12/11/2011  . Protein-calorie malnutrition, moderate ([place]) 12/11/2011  . Oxygen dependent   . Hx of gastric ulcer   . UTI (urinary tract infection) 10/15/2011  . Allergic conjunctivitis 10/15/2011  . Degenerative arthritis of hip 08/03/2011  . B12 deficiency 07/02/2011  . Bilateral leg weakness 01/16/2011  . Anemia, chronic disease 09/14/2010  . Recurrent pneumonia 08/11/2010  . CRT- [employer] 06/26/2010  . DYSURIA 05/19/2010  . Chronic hypoxemic respiratory failure ([place]) 02/21/2010  . DEHYDRATION 01/06/2010  . GASTROENTERITIS 01/06/2010  . Congestive dilated cardiomyopathy ([place]) 11/17/2009  . LBBB 11/17/2009  . SYSTOLIC HEART FAILURE, CHRONIC 11/17/2009  . GERD (gastroesophageal reflux disease) 02/17/2009  . NONSPECIFIC ABN FINDNG RAD&OTH EXAM BILARY TRCT 09/03/2008  . COPD with emphysema ([place]) 06/28/2008  . DIVERTICULOSIS, COLON 06/03/2008  . GASTRITIS 06/02/2008  . Atrial fibrillation/flutter 12/08/2007  . GASTROPARESIS 08/07/2007  . NAUSEA, CHRONIC 08/07/2007  . Abdominal pain 08/07/2007  . SYNCOPE 02/17/2007  . RESTLESS LEG SYNDROME 08/01/2006  . Chronic pain syndrome 08/01/2006  . OSTEOARTHRITIS 07/31/2006  . Degenerative disc disease, lumbar 07/31/2006  . Fibromyalgia 07/31/2006  . CHRONIC FATIGUE SYNDROME 07/31/2006  . URINARY INCONTINENCE 07/31/2006  . ANEMIA-IRON DEFICIENCY 05/01/2006  . Essential hypertension 05/01/2006    Immunization History  Administered Date(s) Administered  . DTaP 11/03/1996, 01/04/1997, 05/03/1997, 10/19/1997, 08/05/2001  . Fluad Quad(high Dose 65+) 11/06/2018, 12/31/2019  . HPV Quadrivalent 09/05/2009, 09/13/2010, 09/26/2011  . Hepatitis A, Ped/Adol-2 Dose 09/21/2004, 03/29/2005  . Hepatitis B, ped/adol 04/30/1996, 11/03/1996, 10/19/1997  . HiB (PRP-T) 11/03/1996, 01/04/1997, 05/03/1997, 10/19/1997  . IPV 11/03/1996,  01/04/1997, 05/03/1997, 08/05/2001  . Influenza Split 01/16/2011, 12/12/2011  . Influenza Whole 01/15/2007, 12/08/2007, 12/28/2008, 12/17/2009  . Influenza, High Dose Seasonal PF 11/28/2015, 12/07/2016, 11/15/2017  . Influenza, Seasonal, Injecte, Preservative Fre 12/09/2012  . Influenza,Quad,Nasal, Live 03/17/2007, 03/06/2009, 01/08/2010, 01/30/2011, 02/09/2012, 01/18/2013  . Influenza,inj,Quad PF,6+ Mos 03/29/2005, 04/30/2005, 03/01/2006, 01/28/2008, 12/01/2013, 01/16/2014, 12/22/2014, 12/28/2014  . MMR 05/03/1997, 08/05/2001  . Meningococcal B, OMV 12/22/2014  . Pneumococcal Conjugate-13 06/10/2014, 11/15/2014  . Pneumococcal Polysaccharide-23 12/17/2009  . Tdap 09/17/2007, 05/15/2014, 12/09/2018  . Varicella 11/16/1997, 05/18/2008  . Zoster 12/31/2013    Conditions to be addressed/monitored: Atrial Fibrillation, CHF, CAD, HTN, HLD, COPD and osteoporosis; MAI infection  Care Plan : General Pharmacy (Adult)  Updates made by Cherre Robins, PHARMD since 06/28/2020 12:00 AM  Problem: Afib, COPD, HF, HTN, HLD, CAD, Chronic Pain Syndrome, GERD, Insomnia   Priority: High  Onset Date: 05/10/2020    Long-Range Goal: Patient-Specific Goal   Start Date: 05/10/2020  Expected End Date: 09/23/2020  Recent Progress: On track  Priority: High  Note:   Current Barriers:  . Unable to independently afford treatment regimen   Pharmacist Clinical Goal(s):  [name] Over the next 90 days, patient will verbalize ability to afford treatment regimen . achieve adherence to monitoring guidelines and medication adherence to achieve therapeutic efficacy . contact provider office for questions/concerns as evidenced notation of same in electronic health record through collaboration with PharmD and provider.    Interventions: . 1:1 collaboration with Carollee Herter, Alferd Apa, DO regarding development and update of comprehensive plan of care as evidenced by provider attestation and co-signature . Inter-disciplinary  care team collaboration (see longitudinal plan of care) . Comprehensive medication review performed; medication list updated in electronic medical record  Hypertension (BP goal <140/90) -controlled -Current treatment:  Amlodipine 2.70m daily PM -Medications previously tried: none noted  -Current home readings: checks infrequently, noted BP usually < 140/80 -Educated on BP goals and benefits of medications for prevention of heart attack, stroke and kidney damage; Reminded about the importance of home blood pressure monitoring; -Recommended to continue current medication  Atrial Fibrillation (Goal: prevent stroke and major bleeding) -controlled  -Current treatment:  Anticoagulation: Eliquis 572mtwice daily (per Dr. LoEtter Sjogren-Medications previously tried: amiodarone, carvedilol -Home BP and HR readings: none noted  -Recommended to continue current anticoagulation therapy -Patient completed PAP application for Eliquis and brought to office 06/17/2020. Reviewed paperwork today. Still in need for financial documents. Patient's husband to bring by office today.   COPD with MAI infection (Goal: control symptoms and prevent exacerbations) -controlled -Current treatment   Incruse ellipta 62.40m89mnh once daily (per Sood)  Proventil (per Sood)  Azithromycin 500m81m3 times per week  Ethambutol 1000mg140m times per week  Rifambutin 300mg 82mtimes per week.  -Medications previously tried: none noted  -Gold Grade: Gold 2 (FEV1 50-79%) -Current COPD Classification:  B (high sx, <2 exacerbations/yr) -Pulmonary function testing: 10/10/2011-pulmonary function test-FVC 1.47 (54% predicted), postbronchodilator ratio 64, postbronchodilator FEV1 1.15 (59% predicted), positive bronchodilator response, DLCO 5.3 (35% predicted) -Exacerbations requiring treatment in last 6 months: none -Patient reports consistent use of maintenance inhaler -Frequency of rescue inhaler use: as needed; usually 1 or 2 times  per week -Recommended continue current inhalers for COPD and antibiotics / antiinfectives for MAI per ID physicians.  -PAP application for Incruse reviewed. All information is complete except needto request updated Rx for Incruse from PCP - need Rx for 90 DS instead of 30 DS. Will coordinate with Dr Lowne.Etter Sjogrentient has gotten Proventil from PAP in past but she states she was told Proventil is no longer available in the program. Reviewed her [employer] formulary and generic albuterol inhaler is available to $15/30 days or $30/90 days supply from mail order. Patient reports this is affordable.  - Patient is taking nitrofurantoin daily for UTI prevention. Consider risk versus benefits in patient with COPD given that nitrofurantoin is associated with pulmonary injury.   Pain (Goal: Minimize symptoms) -controlled -Current treatment   Gabapentin 600mg 432mes daily  Oxycodone 10-3240mg 4 53ms daily  Tizanidine 4mg TID 74mly takes BID)  Acetaminophen 325 2 tabs PRN - uses every other day -Medications previously tried: none noted -Recommended to continue current medication  Patient Goals/Self-Care Activities .  Over the next 90 days, patient will:  - take medications as prescribed check blood pressure periodically, document, and provide at future appointments collaborate with provider on medication access solutions  Follow Up Plan: The care management team will reach out to the patient again over the next 90 days.        Medication Assistance: Application for Incruse and Eliquis  medication assistance program. in process.  Anticipated assistance start date 07/17/2020.  See plan of care for additional detail.  Patient's preferred pharmacy is:  [facility] [place], [place] [address] [address] Phone: [phone] Fax: [phone]  **patient to see Dr Etter Sjogren 07/22/2020. She has been taking low dose nitrofurantoin for  UTI prophylaxis. Consider assess risk versus benefits due to potential pulmonary injury from long term nitrofurantoin use**  Follow Up:  Patient agrees to Care Plan and Follow-up.  Plan: Telephone follow up appointment with care management team member scheduled for:  3 months for CCM review. Will check in with patient in 1 mont by phone to see if has received medications from PAP   Cherre Robins, PharmD Clinical Pharmacist [facility]

## 2020-06-30 ENCOUNTER — Telehealth: Payer: Self-pay | Admitting: Pulmonary Disease

## 2020-06-30 NOTE — Telephone Encounter (Signed)
Pt said that she has had the cough for about 1 week now, using rescue inhaler at least once, maybe twice daily to see if it would help with her symptoms. She is concerned that she may be getting pna. Stated to her at [facility] that we can order a cxr to further evaluate if Dr. Halford Chessman wants to have one done and she verbalized understanding. Nothing further needed.

## 2020-07-01 ENCOUNTER — Telehealth: Payer: Self-pay | Admitting: *Deleted

## 2020-07-01 NOTE — Telephone Encounter (Signed)
Patient called to cancel her appointment for 07/04/20. She stated that she has a very bad cold and is seeing the pulmonologist Tuesday.

## 2020-07-04 ENCOUNTER — Ambulatory Visit: Payer: HMO | Admitting: Family

## 2020-07-04 ENCOUNTER — Other Ambulatory Visit: Payer: Self-pay | Admitting: Cardiology

## 2020-07-04 ENCOUNTER — Other Ambulatory Visit: Payer: HMO

## 2020-07-04 ENCOUNTER — Ambulatory Visit: Payer: HMO

## 2020-07-04 ENCOUNTER — Other Ambulatory Visit: Payer: Self-pay | Admitting: Family Medicine

## 2020-07-04 DIAGNOSIS — R52 Pain, unspecified: Secondary | ICD-10-CM

## 2020-07-04 DIAGNOSIS — I10 Essential (primary) hypertension: Secondary | ICD-10-CM

## 2020-07-04 NOTE — Telephone Encounter (Signed)
Medication: oxyCODONE-acetaminophen (PERCOCET) 10-325 MG tablet [[id number]]     Has the patient contacted their pharmacy? no (If no, request that the patient contact the pharmacy for the refill.) (If yes, when and what did the pharmacy advise?)    Preferred Pharmacy (with phone number or street name [facility] [place], [place] Phone:  [phone]  Fax:  [phone]        Agent: Please be advised that RX refills may take up to 3 business days. We ask that you follow-up with your pharmacy.

## 2020-07-04 NOTE — Telephone Encounter (Signed)
Rx has been sent to the pharmacy electronically. ° °

## 2020-07-04 NOTE — Telephone Encounter (Signed)
This is Dr. Hochrein's pt. °

## 2020-07-05 ENCOUNTER — Other Ambulatory Visit: Payer: Self-pay

## 2020-07-05 ENCOUNTER — Ambulatory Visit: Payer: HMO | Admitting: Pulmonary Disease

## 2020-07-05 ENCOUNTER — Encounter: Payer: Self-pay | Admitting: Pulmonary Disease

## 2020-07-05 VITALS — BP 150/76 | HR 110 | Temp 97.3°F | Ht 60.0 in | Wt 87.2 lb

## 2020-07-05 DIAGNOSIS — J849 Interstitial pulmonary disease, unspecified: Secondary | ICD-10-CM

## 2020-07-05 DIAGNOSIS — J9611 Chronic respiratory failure with hypoxia: Secondary | ICD-10-CM

## 2020-07-05 DIAGNOSIS — J432 Centrilobular emphysema: Secondary | ICD-10-CM | POA: Diagnosis not present

## 2020-07-05 MED ORDER — OXYCODONE-ACETAMINOPHEN 10-325 MG PO TABS: 1.0000 | ORAL_TABLET | ORAL | 0 refills | Status: DC | PRN

## 2020-07-05 NOTE — Progress Notes (Signed)
[facility]  Chief Complaint  Patient presents with  . Follow-up    Cough x 1 week    Constitutional:  BP (!) 150/76 (BP Location: Right Arm, Patient Position: Sitting, Cuff Size: Normal)   Pulse (!) 110   Temp (!) 97.3 F (36.3 C) (Skin)   Ht 5' (1.524 m)   Wt 87 lb 3.2 oz (39.6 kg)   SpO2 97% Comment: 4 L pulsed O2  BMI 17.03 kg/m   Past Medical History:  B12 deficiency, SBO, GERD, Silent Aspiration, RLS, dilated CM, LBBB, Malabsorption, HTN, Gastroparesis, Fibromyalgia, Diverticulitis, CAD, A fib  Past Surgical History:  She  has a past surgical history that includes Appendectomy; pacemaker placement (12/21/2009); Peg removed; Tonsillectomy (age 8); Interstim implant revision (03/06/2011); Portacath placement (12/16/2009); Total abdominal hysterectomy; Interstim Implant placement (05/28/2006 - stage I); Interstim implant revision (10/23/2007); Cystoscopy with injection (04/30/2006); Small intestine surgery (05/20/2001); Cardiac catheterization (04/08/2006, 11/16/2009); Cystoscopy (11/11/2008); Cystoscopy w/ retrogrades (11/11/2008); Colectomy; Colectomy (02/04/2000); Gastrocutaneous fistula closure; Exploratory laparotomy (04/27/2009); Port-a-cath removal (07/27/2011); Total hip arthroplasty (08/03/2011); Interstim Implant placement (02/05/2012); Esophagogastroduodenoscopy (N/A, 02/02/2013); Savory dilation (N/A, 02/02/2013); ERCP (N/A, 06/23/2017); ERCP (N/A, 06/24/2017); Cholecystectomy (N/A, 06/27/2017); IR IMAGING GUIDED PORT INSERTION (10/14/2017); Video bronchoscopy (N/A, 05/25/2019); Bronchial washings (05/25/2019); BIV PACEMAKER GENERATOR CHANGEOUT (N/A, 06/19/2019); and TEE without cardioversion (N/A, 07/17/2019).  Brief Summary:  [name] is a 75 y.o. female former smoker with COPD/emphysema, chronic hypoxic respiratory failure, and recurrent pneumonia with focal bronchiectasis with MAI.      Subjective:   She is here with her husband.  She  developed sinus pressure and cough about a week ago.  She was getting runny nose.  No fever, sweats, or chest pain.  Wasn't bringing up sputum.  Symptoms have improved over past couple of days.  Uses 4 liters continuous flow oxygen.  Asked about inogen device and whether she can get a second concentrator at home.  Uses incruse daily.  Uses albuterol intermittently.  She is anxious to find out when she can come off therapy for MAI.  Has follow up with Dr. Baxter Flattery in May.  Physical Exam:   Appearance - thin, wearing oxygen, in wheelchair  ENMT - no sinus tenderness, no oral exudate, no LAN, Mallampati 2 airway, no stridor  Respiratory - decreased breath sounds bilaterally, no wheezing or rales  CV - s1s2 regular rate and rhythm, no murmurs  Ext - no clubbing, no edema  Skin - no rashes  Psych - normal mood and affect   Pulmonary testing:   PFT4/12/10>>FEV1 1.95 (97%), FEV1% 70, TLC 3.88 (87%), DLCO 64%  PFT7/24/13>> FEV1 1.15 (59%), FEV1% 64, TLC 3.26 (73%), DLCO 35%, + BD  Bronchoscopy 05/25/19 >> MAI  Chest Imaging:   CT chest 04/30/2011 >> Chronic multifocal nodular and patchy airspace densities throughout the right lung with mild BTX  CT chest 03/28/17 >> mild/mod cylindrical BTX diffuse and b/l, tree in bud, nodular infiltrates  CT angio chest 07/16/19 >> diffuse b/l peribronchovascular nodularity  Sleep Tests:   ONO 02/11/10 >> Test time 9 hrs 9 min, Mean SpO2 87%, low SpO2 80%, Spent 5hrs 57 min (65%) with SpO2 < 88%.  Cardiac Tests:   TEE 07/17/19 >> EF 60 to 65%, mild LVH, mild MR  Social History:  She  reports that she quit smoking about 34 years ago. She has a 20.00 pack-year smoking history. She has never used smokeless tobacco. She reports that she does not drink alcohol  and does not use drugs.  Family History:  Her family history includes Breast cancer in her maternal aunt; Diabetes in her paternal grandfather; Heart disease in her brother, father,  mother, and paternal aunt; Stroke in her mother.     Assessment/Plan:   Allergic rhinitis. - recent symptoms likely related to tree pollen burst that is typical for this time of year in [place] - improved since her symptoms started - prn OTC antihistamine pills  ILD, bronchiectasis with MAI. - followed by Dr. Carlyle Basques with ID - has been on zithromax, and ethambutol  - prn flutter valve, mucinex - will arrange for high resolution CT chest to assess status  COPD with emphysema. - spiriva wasn't covered by her insurance plan - continue incruse - prn albuterol  Chronic respiratory failure with hypoxia. - 4 liters oxygen - goal SpO2 > 90% - will place order to see if she can get a second oxygen concentrator at home, but explained this likely would not be covered by insurance - explained that inogen device likely wouldn't be sufficient to meet her O2 needs  Plaque psoriasis. - followed by Dr. Renda Rolls with dermatology - she was getting frequent respiratory infections when she was previously on MTX  Iron and erythropoietin deficiency anemia. - f/u with Dr. Marin Olp  Nonischemic CM, Atrial arrhythmia, s/p pacemaker. - followed by Dr. Virl Axe with [facility]  Time Spent Involved in Patient Care on Day of Examination:  31 minutes  Follow up:  Patient Instructions  Will arrange for CT chest  Will see if [employer] can get approval for a second oxygen concentrator  Follow up in 2 months   Medication List:   Allergies as of 07/05/2020      Reactions   Dabigatran Etexilate Mesylate Other (See Comments)   INTERNAL BLEEDING- Pradaxa   Augmentin [amoxicillin-pot Clavulanate] Diarrhea, Nausea Only   Did it involve swelling of the face/tongue/throat, SOB, or low BP? No Did it involve sudden or severe rash/hives, skin peeling, or any reaction on the inside of your mouth or nose? No Did you need to seek medical attention at a hospital or doctor's office?  No When did it last happen?02/2019 If all above answers are "NO", may proceed with cephalosporin use.   Talwin [pentazocine] Other (See Comments)   Hallucinations      Medication List       Accurate as of July 05, 2020 11:41 AM. If you have any questions, ask your nurse or doctor.        acetaminophen 650 MG CR tablet Commonly known as: TYLENOL Take 650 mg by mouth every 8 (eight) hours as needed for pain.   albuterol 108 (90 Base) MCG/ACT inhaler Commonly known as: VENTOLIN HFA Inhale 2 puffs into the lungs every 6 (six) hours as needed for wheezing or shortness of breath.   amLODipine 2.5 MG tablet Commonly known as: NORVASC TAKE 1 TABLET BY MOUTH AT BEDTIME   atorvastatin 20 MG tablet Commonly known as: LIPITOR TAKE 1 TABLET BY MOUTH ONCE DAILY AT  6PM   azithromycin 500 MG tablet Commonly known as: ZITHROMAX TAKE 1 TABLETS BY MOUTH THREE TIMES A WEEK   Eliquis 5 MG Tabs tablet Generic drug: apixaban Take 1 tablet by mouth twice daily   ethambutol 100 MG tablet Commonly known as: MYAMBUTOL TAKE 2 TABLETS BY MOUTH THREE TIMES A WEEK. TAKE WITH 2 TABLETS OF 400 MG OF ETHAMBUTOL TO EQUAL 1000 MG TOTAL   ethambutol 400 MG  tablet Commonly known as: MYAMBUTOL TAKE 2 TABLETS BY MOUTH THREE TIMES A WEEK **TAKE WITH 2 TABLETS OF 100 MG OF ETHAMBUTOL TO EQUAL 1000 MG TOTAL**   furosemide 40 MG tablet Commonly known as: LASIX TAKE 1 TABLET BY MOUTH IN THE MORNING   gabapentin 600 MG tablet Commonly known as: NEURONTIN 1 po qid   Incruse Ellipta 62.5 MCG/INH Aepb Generic drug: umeclidinium bromide Inhale 1 puff into the lungs daily.   lidocaine-prilocaine cream Commonly known as: EMLA Apply 1 application topically as needed. What changed: reasons to take this   nitrofurantoin 100 MG capsule Commonly known as: MACRODANTIN Take 100 mg by mouth daily.   nitroGLYCERIN 0.4 MG SL tablet Commonly known as: NITROSTAT Place 1 tablet (0.4 mg total) under the tongue  every 5 (five) minutes as needed. CHEST PAIN   oxyCODONE-acetaminophen 10-325 MG tablet Commonly known as: PERCOCET Take 1 tablet by mouth every 4 (four) hours as needed. PAIN   OXYGEN Inhale 3 L/min into the lungs continuous.   pantoprazole 40 MG tablet Commonly known as: PROTONIX Take 1 tablet by mouth twice daily   rifabutin 150 MG capsule Commonly known as: MYCOBUTIN TAKE 2 CAPSULES (300 MG TOTAL) BY MOUTH THREE TIMES A WEEK   tiZANidine 4 MG tablet Commonly known as: ZANAFLEX Take 1 tablet (4 mg total) by mouth 3 (three) times daily as needed for muscle spasms.   triamcinolone ointment 0.1 % Commonly known as: KENALOG Apply 1 application topically daily as needed (to affected areas- for psoriasis).   zolpidem 5 MG tablet Commonly known as: AMBIEN TAKE 1 TABLET BY MOUTH EVERY DAY AT BEDTIME AS NEEDED       Signature:  Chesley Mires, MD [facility] Pager - [phone] 07/05/2020, 11:41 AM

## 2020-07-05 NOTE — Telephone Encounter (Signed)
Requesting: oxycodone/acetaminophen Contract: 04/25/20 UDS: 04/25/20 Last Visit: 04/25/20 Next Visit:  07/22/20 Last Refill: 06/07/20  Please Advise

## 2020-07-05 NOTE — Patient Instructions (Signed)
Will arrange for CT chest  Will see if [employer] can get approval for a second oxygen concentrator  Follow up in 2 months

## 2020-07-07 ENCOUNTER — Telehealth: Payer: Self-pay

## 2020-07-07 NOTE — Telephone Encounter (Signed)
Pt called in to r/s her cancelled appts   [name]

## 2020-07-08 ENCOUNTER — Ambulatory Visit: Payer: HMO | Admitting: Pharmacist

## 2020-07-08 ENCOUNTER — Other Ambulatory Visit: Payer: Self-pay

## 2020-07-08 DIAGNOSIS — I10 Essential (primary) hypertension: Secondary | ICD-10-CM | POA: Diagnosis not present

## 2020-07-08 DIAGNOSIS — I480 Paroxysmal atrial fibrillation: Secondary | ICD-10-CM

## 2020-07-08 DIAGNOSIS — J439 Emphysema, unspecified: Secondary | ICD-10-CM

## 2020-07-08 MED ORDER — ELIQUIS 5 MG PO TABS: 1.0000 | ORAL_TABLET | Freq: Two times a day (BID) | ORAL | 3 refills | Status: DC

## 2020-07-08 MED ORDER — INCRUSE ELLIPTA 62.5 MCG/INH IN AEPB: 1.0000 | INHALATION_SPRAY | Freq: Every day | RESPIRATORY_TRACT | 3 refills | Status: DC

## 2020-07-08 NOTE — Patient Instructions (Signed)
Visit Information  PATIENT GOALS: Goals Addressed            This Visit's Progress   . Chronic Care Management Pharmacy Care Plan   On track    CARE PLAN ENTRY (see longitudinal plan of care for additional care plan information)  Current Barriers:  . Chronic Disease Management support, education, and care coordination needs related to Afib, COPD, HF, HTN, HLD, CAD, Chronic Pain Syndrome, GERD, Insomnia   Hypertension BP Readings from Last 3 Encounters:  07/05/20 (!) 150/76  06/23/20 140/90  06/06/20 (!) 141/65   . Pharmacist Clinical Goal(s): o Over the next 90 days, patient will work with PharmD and providers to achieve BP goal <140/90 . Current regimen:  o Amlodipine 2.5mg  daily each evening . Intervention:  o If BP remains >140/90 when checked at appt in May 2022; consider increasing amlodipine to 5mg  daily. . Patient self care activities - Over the next 90 days, patient will: o Maintain hypertension medication regimen.  o Check blood pressure at home 2 ot 3 times per week, record and bring to office appointments  Atrial Fibrillation:  . Pharmacist Clinical Goal(s): o Over the next 90 days, patient will work with PharmD and providers to improve access to Eliquis (patient is in Medicare coverage gap) . Current regimen:  o Eliquis 5mg  twice a day . Intervention:  o Coordination of completion of patient assistance application for Eliquis.  o Contacted patient's husband to bring in financial documentation needed . Patient self care activities - Over the next 90 days, patient will: o Maintain Eliquis therapy o Notify office once you receive letter with decision for patient assistance for Eliquis.    Hyperlipidemia:   Lab Results  Component Value Date/Time   LDLCALC 54 05/11/2019 10:45 AM   . Pharmacist Clinical Goal(s): o Over the next 90 days, patient will work with PharmD and providers to maintain LDL goal < 70 . Current regimen:  o Atorvastatin 20mg   daily . Patient self care activities - Over the next 90 days, patient will: o Maintain cholesterol medication regimen.   COPD . Pharmacist Clinical Goal(s) o Over the next 90 days, patient will work with PharmD and providers to reduce symptoms associated with COPD and reduce barrier to receiving medication . Current regimen:   Incruse ellipta 62.5mg /inh once daily   Proventil  . Interventions: o Requested updated 90 day supply Rx from Dr Etter Sjogren for [place] - generic Proventil / albuterol inhaler is $15/ 30 days at [facility] on your plan or $30/90 days by [employer] . Patient self care activities - Over the next 90 days, patient will: o Continue current therapy o Notify clinical pharmacist once you have received letter from patient assistance program regarding approval or denial for Incruse.   Osteoporosis  . Pharmacist Clinical Goal(s) o Over the next 180 days, patient will work with PharmD and providers to reduce risk of fracture due to osteoporosis  . Current regimen:  o None . Interventions: o Reviewed benefits of Prolia, but will defer until patient has completed MAI treatment and DEXA rechecked . Patient self care activities - Over the next 180 days, patient will: o Consider restarting Prolia pending next bone density results.   Health Maintenance  . Pharmacist Clinical Goal(s) o Over the next year, patient will work with PharmD and providers to complete health maintenance screenings/vaccinations . Interventions: o Discussed consideration of completing Shingrix vaccine series (would wait until completion of MAI abx treatment) o Discussed  consideration of completing COVID Vaccine Series . Patient self care activities - Over the next year, patient will: o Consider completion of Shingrix vaccine series (after completion of MAI treatment) o Consider completion of COVID Vaccine series   Medication management . Pharmacist Clinical Goal(s): o Over  the next 90 days, patient will work with PharmD and providers to maintain optimal medication adherence . Current pharmacy: [facility]  . Interventions o Comprehensive medication review performed. o Reviewed patient assistance applications for Eliquis and Incruse. Need financial documents to complete Eliquis application and updated Rx for Incruse for 90 day supply. Financial documents requested from patient's husband and plan to bring to office this week. Coordinated new Rx for Incruse with Dr Etter Sjogren.  . Patient self care activities - Over the next 90 days, patient will: o Focus on medication adherence by filling and taking medications appropriately  o Take medications as prescribed o Report any questions or concerns to PharmD and/or provider(s) o Bring in financial documentation for Eliquis patient assistance application  Please see past updates related to this goal by clicking on the "Past Updates" button in the selected goal        The patient verbalized understanding of instructions, educational materials, and care plan provided today and declined offer to receive copy of patient instructions, educational materials, and care plan.   Telephone follow up appointment with care management team member scheduled for: July 2022  Cherre Robins, PharmD Clinical Pharmacist [facility] [phone]

## 2020-07-08 NOTE — Chronic Care Management (AMB) (Signed)
Chronic Care Management Pharmacy Note  07/08/2020 Name:  [name] MRN:  [id number] DOB:  06-30-1945  Subjective: [name] is an 75 y.o. year old female who is a primary patient of Ann Held, DO.  The CCM team was consulted for assistance with disease management and care coordination needs.    Collaboration with provider, office staff and patient assistance program for completion of patient assitance applications and submission to PAP in response to provider referral for pharmacy case management and/or care coordination services.   Consent to Services:  The patient was given information about Chronic Care Management services, agreed to services, and gave verbal consent prior to initiation of services.  Please see initial visit note for detailed documentation.   Patient Care Team: Carollee Herter, Alferd Apa, DO as PCP - General Minus Breeding, MD as PCP - Cardiology (Cardiology) Deboraha Sprang, MD as PCP - Electrophysiology (Cardiology) Rolm Bookbinder, MD as Consulting Physician (General Surgery) Lafayette Dragon, MD (Inactive) as Consulting Physician (Gastroenterology) Gatha Mayer, MD as Consulting Physician (Gastroenterology) Rutherford Guys, MD as Consulting Physician (Ophthalmology) Crista Luria, MD as Consulting Physician (Dermatology) Deboraha Sprang, MD as Consulting Physician (Cardiology) Minus Breeding, MD as Consulting Physician (Cardiology) Cherre Robins, PharmD (Pharmacist)  Recent office visits: 04/25/2020 - PCP (Dr Etter Sjogren) F/U chronic pain / fibromyalgia. No medication changes.  Recent consult visits: 07/05/2020 - Pulmonology (Dr Halford Chessman): F/u cough and sinus pressure x 1 week; prn OTC antihistamine tablets; ordered high resolution CT of chest to assess bronchiectasis with MAI status;  Spiriva not covered by insurance - recommended continue [facility] visits: None in previous 6 months  Objective:  Lab Results  Component Value Date    CREATININE 0.85 06/06/2020   CREATININE 0.79 05/09/2020   CREATININE 0.79 04/11/2020    Lab Results  Component Value Date   HGBA1C 5.2 05/31/2017   Last diabetic Eye exam: No results found for: HMDIABEYEEXA  Last diabetic Foot exam: No results found for: HMDIABFOOTEX      Component Value Date/Time   CHOL 102 05/11/2019 1045   TRIG 97 05/11/2019 1045   TRIG 76 03/11/2006 1059   HDL 29 (L) 05/11/2019 1045   CHOLHDL 3.5 05/11/2019 1045   VLDL 19 05/11/2019 1045   LDLCALC 54 05/11/2019 1045    Hepatic Function Latest Ref Rng & Units 06/06/2020 05/09/2020 04/11/2020  Total Protein 6.5 - 8.1 g/dL 6.9 7.5 7.3  Albumin 3.5 - 5.0 g/dL 4.2 4.6 4.1  AST 15 - 41 U/L 54(H) 19 15  ALT 0 - 44 U/L 52(H) 15 6  Alk Phosphatase 38 - 126 U/L 202(H) 138(H) 119  Total Bilirubin 0.3 - 1.2 mg/dL 0.4 0.4 0.4  Bilirubin, Direct 0.0 - 0.3 mg/dL - - -    Lab Results  Component Value Date/Time   TSH 7.708 (H) 07/20/2019 08:05 PM   TSH 4.43 06/26/2010 01:55 PM   TSH 1.05 02/15/2009 10:23 AM   FREET4 0.68 07/20/2019 08:30 PM    CBC Latest Ref Rng & Units 06/06/2020 05/09/2020 04/11/2020  WBC 4.0 - 10.5 K/uL 4.5 6.3 7.4  Hemoglobin 12.0 - 15.0 g/dL 10.4(L) 11.8(L) 10.2(L)  Hematocrit 36.0 - 46.0 % 32.3(L) 37.1 31.8(L)  Platelets 150 - 400 K/uL 174 214 277    Lab Results  Component Value Date/Time   VD25OH 16.46 (L) 09/06/2015 02:16 PM   VD25OH 4.8 (L) 05/20/2014 03:33 PM   VD25OH 15 (L) 12/12/2011 06:45 PM  Clinical ASCVD: Yes  The ASCVD Risk score Mikey Bussing DC Jr., et al., 2013) failed to calculate for the following reasons:   The valid total cholesterol range is 130 to 320 mg/dL    Other: (CHADS2VASc if Afib, PHQ9 if depression, MMRC or CAT for COPD, ACT, DEXA)  Social History   Tobacco Use  Smoking Status Former Smoker  . Packs/day: 1.00  . Years: 20.00  . Pack years: 20.00  . Quit date: 03/19/1986  . Years since quitting: 34.3  Smokeless Tobacco Never Used   BP Readings from Last  3 Encounters:  07/05/20 (!) 150/76  06/23/20 140/90  06/06/20 (!) 141/65   Pulse Readings from Last 3 Encounters:  07/05/20 (!) 110  06/06/20 82  05/09/20 92   Wt Readings from Last 3 Encounters:  07/05/20 87 lb 3.2 oz (39.6 kg)  06/23/20 88 lb (39.9 kg)  05/09/20 87 lb 14.4 oz (39.9 kg)    Assessment: Review of patient past medical history, allergies, medications, health status, including review of consultants reports, laboratory and other test data, was performed as part of comprehensive evaluation and provision of chronic care management services.   SDOH:  (Social Determinants of Health) assessments and interventions performed:    CCM Care Plan  Allergies  Allergen Reactions  . Dabigatran Etexilate Mesylate Other (See Comments)    INTERNAL BLEEDING- Pradaxa  . Augmentin [Amoxicillin-Pot Clavulanate] Diarrhea and Nausea Only    Did it involve swelling of the face/tongue/throat, SOB, or low BP? No Did it involve sudden or severe rash/hives, skin peeling, or any reaction on the inside of your mouth or nose? No Did you need to seek medical attention at a hospital or doctor's office? No When did it last happen?02/2019 If all above answers are "NO", may proceed with cephalosporin use.   [name] [Pentazocine] Other (See Comments)    Hallucinations     Medications Reviewed Today    Reviewed by Chesley Mires, MD (Physician) on 07/05/20 at 1120  Med List Status: <None>  Medication Order Taking? Sig Documenting Provider Last Dose Status Informant  acetaminophen (TYLENOL) 650 MG CR tablet [id number] Yes Take 650 mg by mouth every 8 (eight) hours as needed for pain. [provider] Taking Active Spouse/Significant Other  albuterol (VENTOLIN HFA) 108 (90 Base) MCG/ACT inhaler [id number] Yes Inhale 2 puffs into the lungs every 6 (six) hours as needed for wheezing or shortness of breath. [provider] Taking Active Spouse/Significant Other           Med Note  Antony Contras, Tamkia Temples B   Mon Jun 20, 2020 11:19 AM) No longer getting from PAP - now available generically  amLODipine (NORVASC) 2.5 MG tablet [id number] Yes TAKE 1 TABLET BY MOUTH AT BEDTIME Minus Breeding, MD Taking Active   atorvastatin (LIPITOR) 20 MG tablet [id number] Yes TAKE 1 TABLET BY MOUTH ONCE DAILY AT  6PM  Patient taking differently: TAKE 1 TABLET BY MOUTH ONCE DAILY AT  [address], Kendrick Fries R, DO Taking Active   azithromycin (ZITHROMAX) 500 MG tablet [id number] Yes TAKE 1 TABLETS BY MOUTH THREE TIMES A Marquette Old, MD Taking Active Spouse/Significant Other  ELIQUIS 5 MG TABS tablet [id number] Yes Take 1 tablet by mouth twice daily Carollee Herter, Kendrick Fries R, DO Taking Active Spouse/Significant Other  ethambutol (MYAMBUTOL) 100 MG tablet [id number] Yes TAKE 2 TABLETS BY MOUTH THREE TIMES A WEEK. TAKE WITH 2 TABLETS OF 400 MG OF ETHAMBUTOL TO EQUAL 1000 MG TOTAL Carlyle Basques, MD  Taking Active Spouse/Significant Other  ethambutol (MYAMBUTOL) 400 MG tablet [id number] Yes TAKE 2 TABLETS BY MOUTH THREE TIMES A WEEK **TAKE WITH 2 TABLETS OF 100 MG OF ETHAMBUTOL TO EQUAL 1000 MG TOTALCarlyle Basques, MD Taking Active Spouse/Significant Other  furosemide (LASIX) 40 MG tablet [id number] Yes TAKE 1 TABLET BY MOUTH IN THE [name], MD Taking Active Spouse/Significant Other  gabapentin (NEURONTIN) 600 MG tablet [id number] Yes 1 po qid Carollee Herter, Kendrick Fries R, DO Taking Active Spouse/Significant Other        Discontinued 06/05/11 1433 (Error)   lidocaine-prilocaine (EMLA) cream [id number] Yes Apply 1 application topically as needed.  Patient taking differently: Apply 1 application topically as needed (for port access- to numb).   Volanda Napoleon, MD Taking Active   nitrofurantoin (MACRODANTIN) 100 MG capsule [id number] Yes Take 100 mg by mouth daily. [provider] Taking Active Spouse/Significant Other  nitroGLYCERIN (NITROSTAT) 0.4 MG SL tablet [id number] Yes Place 1 tablet  (0.4 mg total) under the tongue every 5 (five) minutes as needed. CHEST PAIN Minus Breeding, MD Taking Active Spouse/Significant Other  oxyCODONE-acetaminophen (PERCOCET) 10-325 MG tablet [id number] Yes Take 1 tablet by mouth every 4 (four) hours as needed. PAIN Ann Held, DO Taking Active Spouse/Significant Other  OXYGEN [id number] Yes Inhale 3 L/min into the lungs continuous.  [provider] Taking Active Spouse/Significant Other  pantoprazole (PROTONIX) 40 MG tablet [id number] Yes Take 1 tablet by mouth twice daily Gatha Mayer, MD Taking Active Spouse/Significant Other  rifabutin (MYCOBUTIN) 150 MG capsule [id number] Yes TAKE 2 CAPSULES (300 MG TOTAL) BY MOUTH THREE TIMES A Marquette Old, MD Taking Active Spouse/Significant Other  tiZANidine (ZANAFLEX) 4 MG tablet [id number] Yes Take 1 tablet (4 mg total) by mouth 3 (three) times daily as needed for muscle spasms. Ann Held, DO Taking Active Spouse/Significant Other  triamcinolone ointment (KENALOG) 0.1 % [id number] Yes Apply 1 application topically daily as needed (to affected areas- for psoriasis).  [provider] Taking Active Spouse/Significant Other  umeclidinium bromide (INCRUSE ELLIPTA) 62.5 MCG/INH AEPB [id number] Yes Inhale 1 puff into the lungs daily. Roma Schanz R, DO Taking Active Spouse/Significant Other  zolpidem (AMBIEN) 5 MG tablet [id number] Yes TAKE 1 TABLET BY MOUTH EVERY DAY AT BEDTIME AS NEEDED Ann Held, DO Taking Active Spouse/Significant Other          Patient Active Problem List   Diagnosis Date Noted  . Hematoma 08/07/2019  . Other neutropenia ([place])   . Post-procedural fever 07/20/2019  . Acute febrile illness 07/16/2019  . Pulmonary MAI (mycobacterium avium-intracellulare) infection ([place]) 06/24/2019  . History of total right hip replacement 05/27/2019  . NICM (nonischemic cardiomyopathy) ([place]) 05/19/2019  . Educated about COVID-19 virus infection  09/07/2018  . Erythropoietin deficiency anemia 06/19/2018  . Swelling of calf 02/04/2018  . Bad odor of urine 12/09/2017  . Iron deficiency anemia due to chronic blood loss 08/14/2017  . Iron malabsorption 08/14/2017  . Hyperlipidemia LDL goal <100 07/08/2017  . Elevated ALT measurement   . Hypotension 06/20/2017  . HLD (hyperlipidemia) 06/20/2017  . CAD (coronary artery disease) 06/20/2017  . Protein-calorie malnutrition, severe ([place]) 06/20/2017  . Generalized weakness   . Weakness 05/30/2017  . Focal neurological deficit 05/30/2017  . Chronic diastolic CHF (congestive heart failure) ([place]) 05/30/2017  . TIA (transient ischemic attack) 05/30/2017  . AKI (acute kidney injury) ([place]) 05/30/2017  . Abnormal LFTs 05/30/2017  . Bronchiectasis ([place]) 04/22/2017  . Right otitis  media 07/17/2016  . Cerumen impaction 07/17/2016  . Osteoporosis 03/16/2016  . Headache 11/20/2014  . Skin infection 05/27/2014  . Insomnia 05/27/2014  . Abdominal wall pain 07/09/2013  . Other dysphagia 02/02/2013  . Platelets decreased ([place]) 01/29/2012  . Vitamin D deficiency 12/13/2011  . Hypokalemia 12/13/2011  . Hypocalcemia 12/12/2011  . Adhesions of intestine, very dense, with frozen abdomen 12/11/2011  . SBO (small bowel obstruction), recurrent 12/11/2011  . Protein-calorie malnutrition, moderate ([place]) 12/11/2011  . Oxygen dependent   . Hx of gastric ulcer   . UTI (urinary tract infection) 10/15/2011  . Allergic conjunctivitis 10/15/2011  . Degenerative arthritis of hip 08/03/2011  . B12 deficiency 07/02/2011  . Bilateral leg weakness 01/16/2011  . Anemia, chronic disease 09/14/2010  . Recurrent pneumonia 08/11/2010  . CRT- [employer] 06/26/2010  . DYSURIA 05/19/2010  . Chronic hypoxemic respiratory failure ([place]) 02/21/2010  . DEHYDRATION 01/06/2010  . GASTROENTERITIS 01/06/2010  . Congestive dilated cardiomyopathy ([place]) 11/17/2009  . LBBB 11/17/2009  . SYSTOLIC HEART FAILURE, CHRONIC  11/17/2009  . GERD (gastroesophageal reflux disease) 02/17/2009  . NONSPECIFIC ABN FINDNG RAD&OTH EXAM BILARY TRCT 09/03/2008  . COPD with emphysema ([place]) 06/28/2008  . DIVERTICULOSIS, COLON 06/03/2008  . GASTRITIS 06/02/2008  . Atrial fibrillation/flutter 12/08/2007  . GASTROPARESIS 08/07/2007  . NAUSEA, CHRONIC 08/07/2007  . Abdominal pain 08/07/2007  . SYNCOPE 02/17/2007  . RESTLESS LEG SYNDROME 08/01/2006  . Chronic pain syndrome 08/01/2006  . OSTEOARTHRITIS 07/31/2006  . Degenerative disc disease, lumbar 07/31/2006  . Fibromyalgia 07/31/2006  . CHRONIC FATIGUE SYNDROME 07/31/2006  . URINARY INCONTINENCE 07/31/2006  . ANEMIA-IRON DEFICIENCY 05/01/2006  . Essential hypertension 05/01/2006    Immunization History  Administered Date(s) Administered  . DTaP 11/03/1996, 01/04/1997, 05/03/1997, 10/19/1997, 08/05/2001  . Fluad Quad(high Dose 65+) 11/06/2018, 12/31/2019  . HPV Quadrivalent 09/05/2009, 09/13/2010, 09/26/2011  . Hepatitis A, Ped/Adol-2 Dose 09/21/2004, 03/29/2005  . Hepatitis B, ped/adol 04/30/1996, 11/03/1996, 10/19/1997  . HiB (PRP-T) 11/03/1996, 01/04/1997, 05/03/1997, 10/19/1997  . IPV 11/03/1996, 01/04/1997, 05/03/1997, 08/05/2001  . Influenza Split 01/16/2011, 12/12/2011  . Influenza Whole 01/15/2007, 12/08/2007, 12/28/2008, 12/17/2009  . Influenza, High Dose Seasonal PF 11/28/2015, 12/07/2016, 11/15/2017  . Influenza, Seasonal, Injecte, Preservative Fre 12/09/2012  . Influenza,Quad,Nasal, Live 03/17/2007, 03/06/2009, 01/08/2010, 01/30/2011, 02/09/2012, 01/18/2013  . Influenza,inj,Quad PF,6+ Mos 03/29/2005, 04/30/2005, 03/01/2006, 01/28/2008, 12/01/2013, 01/16/2014, 12/22/2014, 12/28/2014  . MMR 05/03/1997, 08/05/2001  . Meningococcal B, OMV 12/22/2014  . Pneumococcal Conjugate-13 06/10/2014, 11/15/2014  . Pneumococcal Polysaccharide-23 12/17/2009  . Tdap 09/17/2007, 05/15/2014, 12/09/2018  . Varicella 11/16/1997, 05/18/2008  . Zoster 12/31/2013     Conditions to be addressed/monitored: Atrial Fibrillation, CHF, CAD, HTN, HLD, COPD and Pulmonary Disease  Care Plan : General Pharmacy (Adult)  Updates made by Cherre Robins, PHARMD since 07/08/2020 12:00 AM    Problem: Afib, COPD, HF, HTN, HLD, CAD, Chronic Pain Syndrome, GERD, Insomnia   Priority: High  Onset Date: 05/10/2020    Long-Range Goal: Patient-Specific Goal   Start Date: 05/10/2020  Expected End Date: 09/23/2020  Recent Progress: On track  Priority: High  Note:   Current Barriers:  . Unable to independently afford treatment regimen   Pharmacist Clinical Goal(s):  [name] Over the next 90 days, patient will verbalize ability to afford treatment regimen . achieve adherence to monitoring guidelines and medication adherence to achieve therapeutic efficacy . contact provider office for questions/concerns as evidenced notation of same in electronic health record through collaboration with PharmD and provider.    Interventions: . 1:1 collaboration with Carollee Herter, Kendrick Fries  R, DO regarding development and update of comprehensive plan of care as evidenced by provider attestation and co-signature . Inter-disciplinary care team collaboration (see longitudinal plan of care) . Comprehensive medication review performed; medication list updated in electronic medical record  Hypertension (BP goal <140/90) - Uncontrolled BP Readings from Last 3 Encounters:  07/05/20 (!) 150/76  06/23/20 140/90  06/06/20 (!) 141/65   -Current treatment:  Amlodipine 2.13m daily in the evening -Medications previously tried: none noted  -Current home readings: checks infrequently, noted BP usually < 140/80 -Educated on BP goals and benefits of medications for prevention of heart attack, stroke and kidney damage; Reminded about the importance of home blood pressure monitoring; -Recommended to continue current medication - if BP still >140/90 at appt with PCP in May; consider increasing amlodipine to 543m daily   Atrial Fibrillation (Goal: prevent stroke and major bleeding) -controlled  -Current treatment:  Anticoagulation: Eliquis 59m50mwice daily (per Dr. LowEtter SjogrenMedications previously tried: amiodarone, carvedilol -Home BP and HR readings: none noted  -Recommended to continue current anticoagulation therapy -Patient completed PAP application for Eliquis; husband brought financial information to office. Rx from PCP for 90 DS of Eliquis was received. Application and needed information faxed to [employer] patient assistance program today.  COPD with MAI infection (Goal: control symptoms and prevent exacerbations) -controlled -Current treatment   Incruse ellipta 62.59mg20mh once daily (per Sood)  Proventil (per Sood)  Azithromycin 500mg53m times per week  Ethambutol 1000mg 82mtimes per week  Rifambutin 300mg -17mimes per week.  -Medications previously tried: none noted  -Gold Grade: Gold 2 (FEV1 50-79%) -Current COPD Classification:  B (high sx, <2 exacerbations/yr) -Pulmonary function testing: 10/10/2011-pulmonary function test-FVC 1.47 (54% predicted), postbronchodilator ratio 64, postbronchodilator FEV1 1.15 (59% predicted), positive bronchodilator response, DLCO 5.3 (35% predicted) -Exacerbations requiring treatment in last 6 months: none -Patient reports consistent use of maintenance inhaler -Frequency of rescue inhaler use: as needed; usually 1 or 2 times per week -Recommended continue current inhalers for COPD and antibiotics / antiinfectives for MAI per ID physicians.  -PAP application for Incruse reviewed. Received updated Rx today for 90 DS for Incruse from PCP. Application with needed additional information faced [place] [employer] [place] today -Patient has gotten Proventil from PAP in past but she states she was told Proventil is no longer available in the program. Reviewed her [employer] formulary and generic albuterol inhaler is available to $15/30 days or  $30/90 days supply from mail order. Patient reports this is affordable.  - Patient is taking nitrofurantoin daily for UTI prevention. Consider risk versus benefits in patient with COPD given that nitrofurantoin is associated with pulmonary injury.   Pain (Goal: Minimize symptoms) -controlled -Current treatment   Gabapentin 600mg 4 40ms daily  Oxycodone 10-3259mg 4 t13m daily  Tizanidine 4mg TID (53my takes BID)  Acetaminophen 325 2 tabs PRN - uses every other day -Medications previously tried: none noted -Recommended to continue current medication  Patient Goals/Self-Care Activities . Over the next 90 days, patient will:  - take medications as prescribed check blood pressure periodically, document, and provide at future appointments collaborate with provider on medication access solutions  Follow Up Plan: The care management team will reach out to the patient again over the next 90 days.        Medication Assistance: Application for incruse and eliquis  medication assistance program. in process.  Anticipated assistance start date 07/22/2020.  See plan of care for additional detail.  Patient's preferred pharmacy is:  [facility] [place], [address] [place] [address] Phone: [phone] Fax: [phone]   Follow Up:  Patient agrees to Care Plan and Follow-up.  Plan: [employer] PAP application for Incruse completed; Rx received from PCP and application was faxed to PAP for review.  [employer] PAP application for Eliquis was complete; Rx received form PAP and application was faxed to PAP for review.  Telephone follow up appointment with care management team member scheduled for:  CPA will follow up PAP application / approval in May; patient sees PAP in May; PharmD will call again in July unless need arises sooner.   Cherre Robins, PharmD Clinical Pharmacist [facility]

## 2020-07-13 ENCOUNTER — Telehealth: Payer: HMO

## 2020-07-15 ENCOUNTER — Other Ambulatory Visit: Payer: Self-pay

## 2020-07-15 ENCOUNTER — Other Ambulatory Visit: Payer: Self-pay | Admitting: Family Medicine

## 2020-07-15 ENCOUNTER — Ambulatory Visit (INDEPENDENT_AMBULATORY_CARE_PROVIDER_SITE_OTHER)
Admission: RE | Admit: 2020-07-15 | Discharge: 2020-07-15 | Disposition: A | Discharge status: Home / Self Care | Payer: HMO | Source: Ambulatory Visit | Attending: Pulmonary Disease | Admitting: Pulmonary Disease

## 2020-07-15 DIAGNOSIS — I2510 Atherosclerotic heart disease of native coronary artery without angina pectoris: Secondary | ICD-10-CM | POA: Diagnosis not present

## 2020-07-15 DIAGNOSIS — G4700 Insomnia, unspecified: Secondary | ICD-10-CM

## 2020-07-15 DIAGNOSIS — J479 Bronchiectasis, uncomplicated: Secondary | ICD-10-CM | POA: Diagnosis not present

## 2020-07-15 DIAGNOSIS — R918 Other nonspecific abnormal finding of lung field: Secondary | ICD-10-CM | POA: Diagnosis not present

## 2020-07-15 DIAGNOSIS — J849 Interstitial pulmonary disease, unspecified: Secondary | ICD-10-CM | POA: Diagnosis not present

## 2020-07-15 NOTE — Telephone Encounter (Signed)
Requesting: Ambien Contract: 04/25/20 UDS: 04/25/20 Last Visit:  04/25/20 Next Visit: 07/22/20 Last Refill:  06/16/20  Please Advise

## 2020-07-18 ENCOUNTER — Ambulatory Visit: Payer: HMO | Admitting: Family

## 2020-07-18 ENCOUNTER — Ambulatory Visit: Payer: HMO

## 2020-07-18 ENCOUNTER — Other Ambulatory Visit: Payer: HMO

## 2020-07-18 ENCOUNTER — Telehealth: Payer: Self-pay | Admitting: Pharmacist

## 2020-07-18 NOTE — Chronic Care Management (AMB) (Signed)
Chronic Care Management Pharmacy Assistant   Name: [name]  MRN: [id number] DOB: Aug 10, 1945    Reason for Encounter: Patient Assistance Application    Medications: Outpatient Encounter Medications as of 07/18/2020  Medication Sig Note  . acetaminophen (TYLENOL) 650 MG CR tablet Take 650 mg by mouth every 8 (eight) hours as needed for pain.   [name] albuterol (VENTOLIN HFA) 108 (90 Base) MCG/ACT inhaler Inhale 2 puffs into the lungs every 6 (six) hours as needed for wheezing or shortness of breath. 06/20/2020: No longer getting from PAP - now available generically  . amLODipine (NORVASC) 2.5 MG tablet TAKE 1 TABLET BY MOUTH AT BEDTIME   . apixaban (ELIQUIS) 5 MG TABS tablet Take 1 tablet (5 mg total) by mouth 2 (two) times daily.   [name] atorvastatin (LIPITOR) 20 MG tablet TAKE 1 TABLET BY MOUTH ONCE DAILY AT  6PM (Patient taking differently: TAKE 1 TABLET BY MOUTH ONCE DAILY AT  6PM)   . azithromycin (ZITHROMAX) 500 MG tablet TAKE 1 TABLETS BY MOUTH THREE TIMES A WEEK   . ethambutol (MYAMBUTOL) 100 MG tablet TAKE 2 TABLETS BY MOUTH THREE TIMES A WEEK. TAKE WITH 2 TABLETS OF 400 MG OF ETHAMBUTOL TO EQUAL 1000 MG TOTAL   . ethambutol (MYAMBUTOL) 400 MG tablet TAKE 2 TABLETS BY MOUTH THREE TIMES A WEEK **TAKE WITH 2 TABLETS OF 100 MG OF ETHAMBUTOL TO EQUAL 1000 MG TOTAL**   . furosemide (LASIX) 40 MG tablet TAKE 1 TABLET BY MOUTH IN THE MORNING   . gabapentin (NEURONTIN) 600 MG tablet 1 po qid   . lidocaine-prilocaine (EMLA) cream Apply 1 application topically as needed. (Patient taking differently: Apply 1 application topically as needed (for port access- to numb).)   . nitrofurantoin (MACRODANTIN) 100 MG capsule Take 100 mg by mouth daily.   . nitroGLYCERIN (NITROSTAT) 0.4 MG SL tablet Place 1 tablet (0.4 mg total) under the tongue every 5 (five) minutes as needed. CHEST PAIN   . oxyCODONE-acetaminophen (PERCOCET) 10-325 MG tablet Take 1 tablet by mouth every 4 (four) hours as needed. PAIN    . OXYGEN Inhale 3 L/min into the lungs continuous.    . pantoprazole (PROTONIX) 40 MG tablet Take 1 tablet by mouth twice daily   . rifabutin (MYCOBUTIN) 150 MG capsule TAKE 2 CAPSULES (300 MG TOTAL) BY MOUTH THREE TIMES A WEEK   . tiZANidine (ZANAFLEX) 4 MG tablet Take 1 tablet (4 mg total) by mouth 3 (three) times daily as needed for muscle spasms.   [name] triamcinolone ointment (KENALOG) 0.1 % Apply 1 application topically daily as needed (to affected areas- for psoriasis).    [name] umeclidinium bromide (INCRUSE ELLIPTA) 62.5 MCG/INH AEPB Inhale 1 puff into the lungs daily.   [name] zolpidem (AMBIEN) 5 MG tablet TAKE 1 TABLET BY MOUTH EVERY DAY AT BEDTIME AS NEEDED   . [DISCONTINUED] iron dextran complex (INFED) 50 MG/ML injection Please give infed infusion (no test dose needed) over 4 hours per pharmacy calculated dose. Ht: 5'2 Wt: 99 pounds Hgb: 12    No facility-administered encounter medications on file as of 07/18/2020.   Called [employer] patient assistance program regarding patients application for Incruse ellipta. Patient was approved from 07/12/20-03/18/21. Medication will be shipped to patients address in approx. 7-10 business days.  [name] regarding patients eliquis application. Patient wasd denied on 07/13/20 due to not meeting out of pocket expenses of [phone] for entire household.   [name] Clinical Pharmacist  Assistant [phone]

## 2020-07-20 ENCOUNTER — Other Ambulatory Visit: Payer: Self-pay

## 2020-07-20 ENCOUNTER — Encounter: Payer: Self-pay | Admitting: Family

## 2020-07-20 ENCOUNTER — Inpatient Hospital Stay: Payer: HMO | Attending: Family

## 2020-07-20 ENCOUNTER — Inpatient Hospital Stay: Payer: HMO

## 2020-07-20 ENCOUNTER — Inpatient Hospital Stay (HOSPITAL_BASED_OUTPATIENT_CLINIC_OR_DEPARTMENT_OTHER): Payer: HMO | Admitting: Family

## 2020-07-20 VITALS — BP 130/74 | HR 85 | Resp 18

## 2020-07-20 DIAGNOSIS — D631 Anemia in chronic kidney disease: Secondary | ICD-10-CM | POA: Insufficient documentation

## 2020-07-20 DIAGNOSIS — D500 Iron deficiency anemia secondary to blood loss (chronic): Secondary | ICD-10-CM

## 2020-07-20 DIAGNOSIS — N189 Chronic kidney disease, unspecified: Secondary | ICD-10-CM | POA: Diagnosis not present

## 2020-07-20 LAB — CBC WITH DIFFERENTIAL (CANCER CENTER ONLY)
Abs Immature Granulocytes: 0.09 10*3/uL — ABNORMAL HIGH (ref 0.00–0.07)
Basophils Absolute: 0.1 10*3/uL (ref 0.0–0.1)
Basophils Relative: 1 %
Eosinophils Absolute: 0.1 10*3/uL (ref 0.0–0.5)
Eosinophils Relative: 2 %
HCT: 33.9 % — ABNORMAL LOW (ref 36.0–46.0)
Hemoglobin: 10.9 g/dL — ABNORMAL LOW (ref 12.0–15.0)
Immature Granulocytes: 1 %
Lymphocytes Relative: 8 %
Lymphs Abs: 0.6 10*3/uL — ABNORMAL LOW (ref 0.7–4.0)
MCH: 29.2 pg (ref 26.0–34.0)
MCHC: 32.2 g/dL (ref 30.0–36.0)
MCV: 90.9 fL (ref 80.0–100.0)
Monocytes Absolute: 0.7 10*3/uL (ref 0.1–1.0)
Monocytes Relative: 10 %
Neutro Abs: 5.3 10*3/uL (ref 1.7–7.7)
Neutrophils Relative %: 78 %
Platelet Count: 330 10*3/uL (ref 150–400)
RBC: 3.73 MIL/uL — ABNORMAL LOW (ref 3.87–5.11)
RDW: 13.8 % (ref 11.5–15.5)
WBC Count: 6.8 10*3/uL (ref 4.0–10.5)
nRBC: 0 % (ref 0.0–0.2)

## 2020-07-20 LAB — CMP (CANCER CENTER ONLY)
ALT: 12 U/L (ref 0–44)
AST: 20 U/L (ref 15–41)
Albumin: 4.1 g/dL (ref 3.5–5.0)
Alkaline Phosphatase: 146 U/L — ABNORMAL HIGH (ref 38–126)
Anion gap: 8 (ref 5–15)
BUN: 20 mg/dL (ref 8–23)
CO2: 30 mmol/L (ref 22–32)
Calcium: 8.8 mg/dL — ABNORMAL LOW (ref 8.9–10.3)
Chloride: 100 mmol/L (ref 98–111)
Creatinine: 0.92 mg/dL (ref 0.44–1.00)
GFR, Estimated: >60 mL/min (ref >60)
Glucose, Bld: 100 mg/dL — ABNORMAL HIGH (ref 70–99)
Potassium: 4.3 mmol/L (ref 3.5–5.1)
Sodium: 138 mmol/L (ref 135–145)
Total Bilirubin: 0.3 mg/dL (ref 0.3–1.2)
Total Protein: 7.3 g/dL (ref 6.5–8.1)

## 2020-07-20 LAB — FERRITIN: Ferritin: 528 ng/mL — ABNORMAL HIGH (ref 11–307)

## 2020-07-20 LAB — IRON AND TIBC
Iron: 95 ug/dL (ref 28–170)
Saturation Ratios: 36 % — ABNORMAL HIGH (ref 10.4–31.8)
TIBC: 268 ug/dL (ref 250–450)
UIBC: 173 ug/dL

## 2020-07-20 LAB — RETICULOCYTES
Immature Retic Fract: 5.2 % (ref 2.3–15.9)
RBC.: 3.77 MIL/uL — ABNORMAL LOW (ref 3.87–5.11)
Retic Count, Absolute: 24.1 10*3/uL (ref 19.0–186.0)
Retic Ct Pct: 0.6 % (ref 0.4–3.1)

## 2020-07-20 MED ORDER — EPOETIN ALFA-EPBX 40000 UNIT/ML IJ SOLN
40000.0000 [IU] | Freq: Once | INTRAMUSCULAR | Status: AC
  Administered 2020-07-20: 40000 [IU] via SUBCUTANEOUS

## 2020-07-20 MED ORDER — EPOETIN ALFA-EPBX 40000 UNIT/ML IJ SOLN
INTRAMUSCULAR | Status: AC
  Filled 2020-07-20: qty 1

## 2020-07-20 NOTE — Patient Instructions (Signed)

## 2020-07-20 NOTE — Patient Instructions (Signed)

## 2020-07-20 NOTE — Progress Notes (Signed)
Hematology and Oncology Follow Up Visit  ANTHONY ROLAND [id number] 1945-05-17 75 y.o. 07/20/2020   Principle Diagnosis:  Iron deficiency anemia Erythropoietin deficient anemia  Current Therapy: IV iron as indicated Retacrit 40,000 units SQfor hgb <11   Interim History:  Ms. Cleckley is here today for follow-up. She is feeling fatigued and states that she did not sleep well last night.  She states that she recently completed an antibiotic for bronchitis. She has followed up with pulmonology and states that her lungs have gotten a little worse over the last year. She is on 4L [place] supplemental O2 24 hours a day.  She feels that her SOB is stable. She has a dry cough at times.  No blood loss noted. No bruising or petechiae.  No fever, chills, n/v, rash, dizziness, chest pain, palpitations, abdominal pain or changes in bowel or bladder habits.  No swelling or tenderness in her extremities. Neuropathy in her hands is stable.  No falls or syncope.  She has a good appetite and is staying well hydrated. Her weight is stable at 90 lbs.   ECOG Performance Status: 1 - Symptomatic but completely ambulatory  Medications:  Allergies as of 07/20/2020      Reactions   Dabigatran Etexilate Mesylate Other (See Comments)   INTERNAL BLEEDING- Pradaxa   Augmentin [amoxicillin-pot Clavulanate] Diarrhea, Nausea Only   Did it involve swelling of the face/tongue/throat, SOB, or low BP? No Did it involve sudden or severe rash/hives, skin peeling, or any reaction on the inside of your mouth or nose? No Did you need to seek medical attention at a hospital or doctor's office? No When did it last happen?02/2019 If all above answers are "NO", may proceed with cephalosporin use.   Talwin [pentazocine] Other (See Comments)   Hallucinations      Medication List       Accurate as of Jul 20, 2020 10:51 AM. If you have any questions, ask your nurse or doctor.        acetaminophen 650 MG CR  tablet Commonly known as: TYLENOL Take 650 mg by mouth every 8 (eight) hours as needed for pain.   albuterol 108 (90 Base) MCG/ACT inhaler Commonly known as: VENTOLIN HFA Inhale 2 puffs into the lungs every 6 (six) hours as needed for wheezing or shortness of breath.   amLODipine 2.5 MG tablet Commonly known as: NORVASC TAKE 1 TABLET BY MOUTH AT BEDTIME   atorvastatin 20 MG tablet Commonly known as: LIPITOR TAKE 1 TABLET BY MOUTH ONCE DAILY AT  6PM   azithromycin 500 MG tablet Commonly known as: ZITHROMAX TAKE 1 TABLETS BY MOUTH THREE TIMES A WEEK   Eliquis 5 MG Tabs tablet Generic drug: apixaban Take 1 tablet (5 mg total) by mouth 2 (two) times daily.   ethambutol 100 MG tablet Commonly known as: MYAMBUTOL TAKE 2 TABLETS BY MOUTH THREE TIMES A WEEK. TAKE WITH 2 TABLETS OF 400 MG OF ETHAMBUTOL TO EQUAL 1000 MG TOTAL   ethambutol 400 MG tablet Commonly known as: MYAMBUTOL TAKE 2 TABLETS BY MOUTH THREE TIMES A WEEK **TAKE WITH 2 TABLETS OF 100 MG OF ETHAMBUTOL TO EQUAL 1000 MG TOTAL**   fluconazole 150 MG tablet Commonly known as: DIFLUCAN Take 1 tablet by mouth daily.   furosemide 40 MG tablet Commonly known as: LASIX TAKE 1 TABLET BY MOUTH IN THE MORNING   gabapentin 600 MG tablet Commonly known as: NEURONTIN 1 po qid   Incruse Ellipta 62.5 MCG/INH Aepb Generic drug: umeclidinium  bromide Inhale 1 puff into the lungs daily.   lidocaine-prilocaine cream Commonly known as: EMLA Apply 1 application topically as needed. What changed: reasons to take this   nitrofurantoin 100 MG capsule Commonly known as: MACRODANTIN Take 100 mg by mouth daily.   nitroGLYCERIN 0.4 MG SL tablet Commonly known as: NITROSTAT Place 1 tablet (0.4 mg total) under the tongue every 5 (five) minutes as needed. CHEST PAIN   oxyCODONE-acetaminophen 10-325 MG tablet Commonly known as: PERCOCET Take 1 tablet by mouth every 4 (four) hours as needed. PAIN   OXYGEN Inhale 3 L/min into the  lungs continuous.   pantoprazole 40 MG tablet Commonly known as: PROTONIX Take 1 tablet by mouth twice daily   rifabutin 150 MG capsule Commonly known as: MYCOBUTIN TAKE 2 CAPSULES (300 MG TOTAL) BY MOUTH THREE TIMES A WEEK   tiZANidine 4 MG tablet Commonly known as: ZANAFLEX Take 1 tablet (4 mg total) by mouth 3 (three) times daily as needed for muscle spasms.   triamcinolone ointment 0.1 % Commonly known as: KENALOG Apply 1 application topically daily as needed (to affected areas- for psoriasis).   zolpidem 5 MG tablet Commonly known as: AMBIEN TAKE 1 TABLET BY MOUTH EVERY DAY AT BEDTIME AS NEEDED       Allergies:  Allergies  Allergen Reactions  . Dabigatran Etexilate Mesylate Other (See Comments)    INTERNAL BLEEDING- Pradaxa  . Augmentin [Amoxicillin-Pot Clavulanate] Diarrhea and Nausea Only    Did it involve swelling of the face/tongue/throat, SOB, or low BP? No Did it involve sudden or severe rash/hives, skin peeling, or any reaction on the inside of your mouth or nose? No Did you need to seek medical attention at a hospital or doctor's office? No When did it last happen?02/2019 If all above answers are "NO", may proceed with cephalosporin use.   [name] [Pentazocine] Other (See Comments)    Hallucinations     Past Medical History, Surgical history, Social history, and Family History were reviewed and updated.  Review of Systems: All other 10 point review of systems is negative.   Physical Exam:  vitals were not taken for this visit.   Wt Readings from Last 3 Encounters:  07/05/20 87 lb 3.2 oz (39.6 kg)  06/23/20 88 lb (39.9 kg)  05/09/20 87 lb 14.4 oz (39.9 kg)    Ocular: Sclerae unicteric, pupils equal, round and reactive to light Ear-nose-throat: Oropharynx clear, dentition fair Lymphatic: No cervical or supraclavicular adenopathy Lungs no rales or rhonchi, good excursion bilaterally Heart regular rate and rhythm, no murmur appreciated Abd soft,  nontender, positive bowel sounds MSK no focal spinal tenderness, no joint edema Neuro: non-focal, well-oriented, appropriate affect Breasts: Deferred   Lab Results  Component Value Date   WBC 6.8 07/20/2020   HGB 10.9 (L) 07/20/2020   HCT 33.9 (L) 07/20/2020   MCV 90.9 07/20/2020   PLT 330 07/20/2020   Lab Results  Component Value Date   FERRITIN 1,242 (H) 06/06/2020   IRON 81 06/06/2020   TIBC 198 (L) 06/06/2020   UIBC 117 (L) 06/06/2020   IRONPCTSAT 41 06/06/2020   Lab Results  Component Value Date   RETICCTPCT 0.6 07/20/2020   RBC 3.77 (L) 07/20/2020   RETICCTABS 64.6 04/01/2009   No results found for: KPAFRELGTCHN, LAMBDASER, KAPLAMBRATIO No results found for: IGGSERUM, IGA, IGMSERUM No results found for: TOTALPROTELP, ALBUMINELP, A1GS, A2GS, BETS, BETA2SER, GAMS, MSPIKE, SPEI   Chemistry      Component Value Date/Time   NA  138 07/20/2020 1005   NA 135 06/16/2019 1333   K 4.3 07/20/2020 1005   CL 100 07/20/2020 1005   CO2 30 07/20/2020 1005   BUN 20 07/20/2020 1005   BUN 31 (H) 06/16/2019 1333   CREATININE 0.92 07/20/2020 1005   CREATININE 0.91 03/16/2016 1539      Component Value Date/Time   CALCIUM 8.8 (L) 07/20/2020 1005   ALKPHOS 146 (H) 07/20/2020 1005   AST 20 07/20/2020 1005   ALT 12 07/20/2020 1005   BILITOT 0.3 07/20/2020 1005       Impression and Plan: Ms. Bradstreet is a very pleasant 75yo caucasian female with iron deficiency anemia secondary to malabsorption and possible GI blood loss with Eliquis. She received ESA today for Hgb 10.9.  Iron studies are pending. We will replace if needed.  Lab and injection every 4 weeks with follow-up in 8 weeks.  She was encouraged to contact our office with any questions or concerns.   Laverna Peace, NP 5/4/202210:51 AM

## 2020-07-22 ENCOUNTER — Telehealth (INDEPENDENT_AMBULATORY_CARE_PROVIDER_SITE_OTHER): Payer: HMO | Admitting: Family Medicine

## 2020-07-22 ENCOUNTER — Encounter: Payer: Self-pay | Admitting: Family Medicine

## 2020-07-22 ENCOUNTER — Other Ambulatory Visit: Payer: Self-pay

## 2020-07-22 DIAGNOSIS — M797 Fibromyalgia: Secondary | ICD-10-CM

## 2020-07-22 NOTE — Assessment & Plan Note (Signed)
Database reviewed Uds/ contract utd F/u 3 months

## 2020-07-22 NOTE — Progress Notes (Signed)
Virtual telephone visit    Virtual Visit via Telephone Note   This visit type was conducted due to national recommendations for restrictions regarding the COVID-19 Pandemic (e.g. social distancing) in an effort to limit this patient's exposure and mitigate transmission in our community. Due to her co-morbid illnesses, this patient is at least at moderate risk for complications without adequate follow up. This format is felt to be most appropriate for this patient at this time. The patient did not have access to video technology or had technical difficulties with video requiring transitioning to audio format only (telephone). Physical exam was limited to content and character of the telephone converstion. [name] was able to get the patient set up on a telephone visit.   Patient location: home alone Patient and provider in visit Provider location: Office  I discussed the limitations of evaluation and management by telemedicine and the availability of in person appointments. The patient expressed understanding and agreed to proceed.   Visit Date: 07/22/2020  Today's healthcare provider: Ann Held, DO     Subjective:    Patient ID: [name], female    DOB: [birth year], 75 y.o.   MRN: [id number]  Chief Complaint  Patient presents with  . Fibromyalgia  . Follow-up    HPI Patient is in today for f/u fibromyalgia and pain med refill -- no complaints  Past Medical History:  Diagnosis Date  . Anemia   . Arthritis   . Atrial fibrillation ([place])   . CAD (coronary artery disease)    Mild disease per cath in 2011  . COPD (chronic obstructive pulmonary disease) ([place])    Home O2  . Diverticulitis   . Elbow fracture, left aug 2012  . Erythropoietin deficiency anemia 06/19/2018  . Essential hypertension   . Fibromyalgia   . Gastroparesis   . GERD (gastroesophageal reflux disease)   . H/O: GI bleed    from Pradaxa  . Hepatitis    Hx of in high school   .  History of pneumonia    Recurrent  . Hx MRSA infection   . Hx of gastric ulcer   . Hx of stress fx 10/2009   Right hip   . Iron malabsorption 08/14/2017  . LBBB (left bundle branch block)   . Oxygen dependent    2 liters via nasal cannula at all times  . Pacemaker    CRT therapy; followed by Dr. Caryl Comes  . Pleural effusion     s/p right thoracentesis 03/09  . Primary dilated cardiomyopathy ([facility])    EF 45 to 50% per echo in Jan 2012  . RLS (restless legs syndrome)   . Silent aspiration   . Small bowel obstruction ([place])   . Urinary incontinence   . Venous embolism and thrombosis of subclavian vein [facility])    After pacemaker insertion Oct 2011  . Vitamin B12 deficiency   . Wears glasses    Reading    Past Surgical History:  Procedure Laterality Date  . APPENDECTOMY    . BIV PACEMAKER GENERATOR CHANGEOUT N/A 06/19/2019   Procedure: BIV PACEMAKER GENERATOR CHANGEOUT;  Surgeon: Deboraha Sprang, MD;  Location: [facility];  Service: Cardiovascular;  Laterality: N/A;  . BRONCHIAL WASHINGS  05/25/2019   Procedure: BRONCHIAL WASHINGS;  Surgeon: Chesley Mires, MD;  Location: [facility];  Service: Endoscopy;;  . CARDIAC CATHETERIZATION  04/08/2006, 11/16/2009  . CHOLECYSTECTOMY N/A 06/27/2017   Procedure: OPEN CHOLECYSTECTOMY;  Surgeon: Ralene Ok, MD;  Location: [facility];  Service: General;  Laterality: N/A;  . COLECTOMY     Intestinal resection (5times)  . COLECTOMY  02/04/2000   ex. lap., intra-abd. subtotal colectomy with ileosigmoid colon anastomosies and lysis of adhesions  . CYSTOSCOPY  11/11/2008  . CYSTOSCOPY W/ RETROGRADES  11/11/2008   right  . CYSTOSCOPY WITH INJECTION  04/30/2006   transurethral collagen injection; incision vaginal stenosis  . ERCP N/A 06/23/2017   Procedure: ENDOSCOPIC RETROGRADE CHOLANGIOPANCREATOGRAPHY (ERCP);  Surgeon: Carol Ada, MD;  Location: [place] [facility];  Service: Endoscopy;  Laterality: N/A;  . ERCP N/A 06/24/2017   Procedure: ENDOSCOPIC  RETROGRADE CHOLANGIOPANCREATOGRAPHY (ERCP);  Surgeon: [name], MD;  Location: [facility];  Service: Endoscopy;  Laterality: N/A;  . ESOPHAGOGASTRODUODENOSCOPY N/A 02/02/2013   Procedure: ESOPHAGOGASTRODUODENOSCOPY (EGD);  Surgeon: Lafayette Dragon, MD;  Location: [facility];  Service: Endoscopy;  Laterality: N/A;  . EXPLORATORY LAPAROTOMY  04/27/2009   lysis of adhesions, gastrostomy tube  . GASTROCUTANEOUS FISTULA CLOSURE    . INTERSTIM IMPLANT PLACEMENT  05/28/2006 - stage I   06/05/2006 - stage II  . INTERSTIM IMPLANT PLACEMENT  02/05/2012   Procedure: [name] IMPLANT FIRST STAGE;  Surgeon: Reece Packer, MD;  Location: [facility];  Service: Urology;  Laterality: N/A;  Replacement of Interstim Lead     . INTERSTIM IMPLANT REVISION  03/06/2011   Procedure: REVISION OF [name];  Surgeon: Reece Packer, MD;  Location: [facility];  Service: Urology;  Laterality: N/A;  Replacement of Neurostimulator  . INTERSTIM IMPLANT REVISION  10/23/2007  . IR IMAGING GUIDED PORT INSERTION  10/14/2017  . PACEMAKER PLACEMENT  12/21/2009  . Peg removed     with complications  . PORT-A-CATH REMOVAL  07/27/2011   Procedure: REMOVAL PORT-A-CATH;  Surgeon: Rolm Bookbinder, MD;  Location: [facility];  Service: General;  Laterality: N/A;  . PORTACATH PLACEMENT  12/16/2009  . SAVORY DILATION N/A 02/02/2013   Procedure: SAVORY DILATION;  Surgeon: Lafayette Dragon, MD;  [facility];  Service: Endoscopy;  Laterality: N/A;  . SMALL INTESTINE SURGERY  05/20/2001   ex. lap., resection of small bowel stricture; gastrostomy; insertion central line  . TEE WITHOUT CARDIOVERSION N/A 07/17/2019   Procedure: TRANSESOPHAGEAL ECHOCARDIOGRAM (TEE);  Surgeon: Donato Heinz, MD;  Location[facility] ENDOSCOPY;  Service: Cardiovascular;  Laterality: N/A;  . TONSILLECTOMY  age 41  . TOTAL ABDOMINAL HYSTERECTOMY     complete  . TOTAL HIP ARTHROPLASTY  08/03/2011   Procedure: TOTAL HIP ARTHROPLASTY  ANTERIOR APPROACH;  Surgeon: Mcarthur Rossetti, MD;  Location: [facility];  Service: Orthopedics;  Laterality: Right;  [name] VIDEO BRONCHOSCOPY N/A 05/25/2019   Procedure: VIDEO BRONCHOSCOPY WITHOUT FLUORO;  Surgeon: Chesley Mires, MD;  [facility];  Service: Endoscopy;  Laterality: N/A;    Family History  Problem Relation Age of Onset  . Heart disease Father   . Stroke Mother   . Heart disease Mother   . Heart disease Brother   . Breast cancer Maternal Aunt   . Heart disease Paternal Aunt   . Diabetes Paternal Grandfather   . Colon cancer Neg Hx     Social History   Socioeconomic History  . Marital status: Married    Spouse name: Not on file  . Number of children: 5  . Years of education: Not on file  . Highest education level: Not on file  Occupational History  . Occupation: disabled    Employer: RETIRED  Tobacco Use  . Smoking status: Former  Smoker    Packs/day: 1.00    Years: 20.00    Pack years: 20.00    Quit date: 03/19/1986    Years since quitting: 34.3  . Smokeless tobacco: Never Used  Vaping Use  . Vaping Use: Never used  Substance and Sexual Activity  . Alcohol use: No    Alcohol/week: 0.0 standard drinks  . Drug use: No  . Sexual activity: Yes  Other Topics Concern  . Not on file  Social History Narrative   Occupation: Disabled   Married - second time - biologic daughter (in [place]) from first marriage - adopted 4 3 alive with second marriage   Alcohol Use - no   Illicit Drug Use - no   Patient is a former smoker, quit 1988 x5yrs, <1ppd.    Daily Caffeine Use   01/20/2015 - updated   Social Determinants of Health   Financial Resource Strain: Medium Risk  . Difficulty of Paying Living Expenses: Somewhat hard  Food Insecurity: No Food Insecurity  . Worried About Charity fundraiser in the Last Year: Never true  . Ran Out of Food in the Last Year: Never true  Transportation Needs: No Transportation Needs  . Lack of Transportation (Medical): No  .  Lack of Transportation (Non-Medical): No  Physical Activity: Not on file  Stress: Not on file  Social Connections: Not on file  Intimate Partner Violence: Not on file    Outpatient Medications Prior to Visit  Medication Sig Dispense Refill  . acetaminophen (TYLENOL) 650 MG CR tablet Take 650 mg by mouth every 8 (eight) hours as needed for pain.    [name] albuterol (VENTOLIN HFA) 108 (90 Base) MCG/ACT inhaler Inhale 2 puffs into the lungs every 6 (six) hours as needed for wheezing or shortness of breath.    [name] amLODipine (NORVASC) 2.5 MG tablet TAKE 1 TABLET BY MOUTH AT BEDTIME 90 tablet 2  . apixaban (ELIQUIS) 5 MG TABS tablet Take 1 tablet (5 mg total) by mouth 2 (two) times daily. 180 tablet 3  . atorvastatin (LIPITOR) 20 MG tablet TAKE 1 TABLET BY MOUTH ONCE DAILY AT  6PM (Patient taking differently: TAKE 1 TABLET BY MOUTH ONCE DAILY AT  6PM) 90 tablet 1  . azithromycin (ZITHROMAX) 500 MG tablet TAKE 1 TABLETS BY MOUTH THREE TIMES A WEEK 12 tablet 0  . ethambutol (MYAMBUTOL) 100 MG tablet TAKE 2 TABLETS BY MOUTH THREE TIMES A WEEK. TAKE WITH 2 TABLETS OF 400 MG OF ETHAMBUTOL TO EQUAL 1000 MG TOTAL 24 tablet 0  . ethambutol (MYAMBUTOL) 400 MG tablet TAKE 2 TABLETS BY MOUTH THREE TIMES A WEEK **TAKE WITH 2 TABLETS OF 100 MG OF ETHAMBUTOL TO EQUAL 1000 MG TOTAL** 24 tablet 0  . fluconazole (DIFLUCAN) 150 MG tablet Take 1 tablet by mouth daily.    . furosemide (LASIX) 40 MG tablet TAKE 1 TABLET BY MOUTH IN THE MORNING 90 tablet 0  . gabapentin (NEURONTIN) 600 MG tablet 1 po qid 120 tablet 5  . lidocaine-prilocaine (EMLA) cream Apply 1 application topically as needed. (Patient taking differently: Apply 1 application topically as needed (for port access- to numb).) 30 g 6  . nitrofurantoin (MACRODANTIN) 100 MG capsule Take 100 mg by mouth daily.    . nitroGLYCERIN (NITROSTAT) 0.4 MG SL tablet Place 1 tablet (0.4 mg total) under the tongue every 5 (five) minutes as needed. CHEST PAIN 25 tablet 6  .  oxyCODONE-acetaminophen (PERCOCET) 10-325 MG tablet Take 1 tablet by mouth every  4 (four) hours as needed. PAIN 120 tablet 0  . OXYGEN Inhale 3 L/min into the lungs continuous.     . pantoprazole (PROTONIX) 40 MG tablet Take 1 tablet by mouth twice daily 180 tablet 0  . rifabutin (MYCOBUTIN) 150 MG capsule TAKE 2 CAPSULES (300 MG TOTAL) BY MOUTH THREE TIMES A WEEK 24 capsule 0  . tiZANidine (ZANAFLEX) 4 MG tablet Take 1 tablet (4 mg total) by mouth 3 (three) times daily as needed for muscle spasms. 90 tablet 3  . triamcinolone ointment (KENALOG) 0.1 % Apply 1 application topically daily as needed (to affected areas- for psoriasis).     [name] umeclidinium bromide (INCRUSE ELLIPTA) 62.5 MCG/INH AEPB Inhale 1 puff into the lungs daily. 90 each 3  . zolpidem (AMBIEN) 5 MG tablet TAKE 1 TABLET BY MOUTH EVERY DAY AT BEDTIME AS NEEDED 30 tablet 0   No facility-administered medications prior to visit.    Allergies  Allergen Reactions  . Dabigatran Etexilate Mesylate Other (See Comments)    INTERNAL BLEEDING- Pradaxa  . Augmentin [Amoxicillin-Pot Clavulanate] Diarrhea and Nausea Only    Did it involve swelling of the face/tongue/throat, SOB, or low BP? No Did it involve sudden or severe rash/hives, skin peeling, or any reaction on the inside of your mouth or nose? No Did you need to seek medical attention at a hospital or doctor's office? No When did it last happen?02/2019 If all above answers are "NO", may proceed with cephalosporin use.   [name] [Pentazocine] Other (See Comments)    Hallucinations     Review of Systems  Constitutional: Negative for chills, fever and malaise/fatigue.  HENT: Negative for congestion and hearing loss.   Eyes: Negative for discharge.  Respiratory: Negative for cough, sputum production and shortness of breath.   Cardiovascular: Negative for chest pain, palpitations and leg swelling.  Gastrointestinal: Negative for abdominal pain, blood in stool, constipation,  diarrhea, heartburn, nausea and vomiting.  Genitourinary: Negative for dysuria, frequency, hematuria and urgency.  Musculoskeletal: Positive for joint pain. Negative for back pain, falls and myalgias.  Skin: Negative for rash.  Neurological: Negative for dizziness, sensory change, loss of consciousness, weakness and headaches.  Endo/Heme/Allergies: Negative for environmental allergies. Does not bruise/bleed easily.  Psychiatric/Behavioral: Negative for depression and suicidal ideas. The patient is not nervous/anxious and does not have insomnia.        Objective:    Physical Exam Vitals and nursing note reviewed.  Musculoskeletal:        General: Tenderness present.  Psychiatric:        Mood and Affect: Mood normal.        Behavior: Behavior normal.        Thought Content: Thought content normal.        Judgment: Judgment normal.     There were no vitals taken for this visit. Wt Readings from Last 3 Encounters:  07/05/20 87 lb 3.2 oz (39.6 kg)  06/23/20 88 lb (39.9 kg)  05/09/20 87 lb 14.4 oz (39.9 kg)    Diabetic Foot Exam - Simple   No data filed    Lab Results  Component Value Date   WBC 6.8 07/20/2020   HGB 10.9 (L) 07/20/2020   HCT 33.9 (L) 07/20/2020   PLT 330 07/20/2020   GLUCOSE 100 (H) 07/20/2020   CHOL 102 05/11/2019   TRIG 97 05/11/2019   HDL 29 (L) 05/11/2019   LDLCALC 54 05/11/2019   ALT 12 07/20/2020   AST 20 07/20/2020  NA 138 07/20/2020   K 4.3 07/20/2020   CL 100 07/20/2020   CREATININE 0.92 07/20/2020   BUN 20 07/20/2020   CO2 30 07/20/2020   TSH 7.708 (H) 07/20/2019   INR 1.1 07/15/2019   HGBA1C 5.2 05/31/2017    Lab Results  Component Value Date   TSH 7.708 (H) 07/20/2019   Lab Results  Component Value Date   WBC 6.8 07/20/2020   HGB 10.9 (L) 07/20/2020   HCT 33.9 (L) 07/20/2020   MCV 90.9 07/20/2020   PLT 330 07/20/2020   Lab Results  Component Value Date   NA 138 07/20/2020   K 4.3 07/20/2020   CO2 30 07/20/2020    GLUCOSE 100 (H) 07/20/2020   BUN 20 07/20/2020   CREATININE 0.92 07/20/2020   BILITOT 0.3 07/20/2020   ALKPHOS 146 (H) 07/20/2020   AST 20 07/20/2020   ALT 12 07/20/2020   PROT 7.3 07/20/2020   ALBUMIN 4.1 07/20/2020   CALCIUM 8.8 (L) 07/20/2020   ANIONGAP 8 07/20/2020   GFR 71.19 08/18/2018   Lab Results  Component Value Date   CHOL 102 05/11/2019   Lab Results  Component Value Date   HDL 29 (L) 05/11/2019   Lab Results  Component Value Date   LDLCALC 54 05/11/2019   Lab Results  Component Value Date   TRIG 97 05/11/2019   Lab Results  Component Value Date   CHOLHDL 3.5 05/11/2019   Lab Results  Component Value Date   HGBA1C 5.2 05/31/2017       Assessment & Plan:   Problem List Items Addressed This Visit      Unprioritized   Fibromyalgia - Primary    Database reviewed Uds/ contract utd F/u 3 months          I am having [name] maintain her lidocaine-prilocaine, triamcinolone ointment, acetaminophen, atorvastatin, OXYGEN, nitroGLYCERIN, albuterol, nitrofurantoin, tiZANidine, gabapentin, furosemide, pantoprazole, ethambutol, rifabutin, ethambutol, azithromycin, amLODipine, oxyCODONE-acetaminophen, Eliquis, Incruse Ellipta, zolpidem, and fluconazole.  No orders of the defined types were placed in this encounter.    I discussed the assessment and treatment plan with the patient. The patient was provided an opportunity to ask questions and all were answered. The patient agreed with the plan and demonstrated an understanding of the instructions.   The patient was advised to call back or seek an in-person evaluation if the symptoms worsen or if the condition fails to improve as anticipated.  I provided 25 minutes of non-face-to-face time during this encounter.   Ann Held, DO [place] [facility] [employer] [facility] [phone] (phone) [phone] (fax)  [place]

## 2020-07-26 ENCOUNTER — Other Ambulatory Visit: Payer: Self-pay | Admitting: Family Medicine

## 2020-07-26 ENCOUNTER — Ambulatory Visit (INDEPENDENT_AMBULATORY_CARE_PROVIDER_SITE_OTHER): Payer: HMO | Admitting: Pharmacist

## 2020-07-26 ENCOUNTER — Other Ambulatory Visit: Payer: Self-pay | Admitting: Cardiology

## 2020-07-26 DIAGNOSIS — J439 Emphysema, unspecified: Secondary | ICD-10-CM

## 2020-07-26 DIAGNOSIS — G894 Chronic pain syndrome: Secondary | ICD-10-CM

## 2020-07-26 DIAGNOSIS — R52 Pain, unspecified: Secondary | ICD-10-CM

## 2020-07-26 DIAGNOSIS — I480 Paroxysmal atrial fibrillation: Secondary | ICD-10-CM

## 2020-07-26 DIAGNOSIS — I10 Essential (primary) hypertension: Secondary | ICD-10-CM | POA: Diagnosis not present

## 2020-07-26 NOTE — Chronic Care Management (AMB) (Signed)
Chronic Care Management Pharmacy Note  07/26/2020 Name:  [name] MRN:  [id number] DOB:  02-10-1946  Subjective: [name] is an 75 y.o. year old female who is a primary patient of Ann Held, DO.  The CCM team was consulted for assistance with disease management and care coordination needs.    Engaged with patient by telephone for follow up patient assistance applications and follow up chronic conditions in response to provider referral for pharmacy case management and/or care coordination services.   Consent to Services:  The patient was given information about Chronic Care Management services, agreed to services, and gave verbal consent prior to initiation of services.  Please see initial visit note for detailed documentation.   Patient Care Team: Carollee Herter, Alferd Apa, DO as PCP - General Minus Breeding, MD as PCP - Cardiology (Cardiology) Deboraha Sprang, MD as PCP - Electrophysiology (Cardiology) Rolm Bookbinder, MD as Consulting Physician (General Surgery) Lafayette Dragon, MD (Inactive) as Consulting Physician (Gastroenterology) Gatha Mayer, MD as Consulting Physician (Gastroenterology) Rutherford Guys, MD as Consulting Physician (Ophthalmology) Crista Luria, MD as Consulting Physician (Dermatology) Deboraha Sprang, MD as Consulting Physician (Cardiology) Minus Breeding, MD as Consulting Physician (Cardiology) Cherre Robins, PharmD (Pharmacist)  Recent office visits: 07/22/2020 - PCP (Dr Etter Sjogren) - video visit to f/u fibromyalgia. No medication changes  Recent consult visits: 07/20/2020 - Hematology [facility], NP) f/u iron deficiency anemia and erythropoietin deficient anemia. She received ESA today for Hgb 10.9.  Iron studies are pending. We will replace if needed.  Lab and injection every 4 weeks with follow-up in 8 weeks.    Hospital visits: None in previous 6 months  Objective:  Lab Results  Component Value Date   CREATININE 0.92  07/20/2020   CREATININE 0.85 06/06/2020   CREATININE 0.79 05/09/2020    Lab Results  Component Value Date   HGBA1C 5.2 05/31/2017   Last diabetic Eye exam: No results found for: HMDIABEYEEXA  Last diabetic Foot exam: No results found for: HMDIABFOOTEX      Component Value Date/Time   CHOL 102 05/11/2019 1045   TRIG 97 05/11/2019 1045   TRIG 76 03/11/2006 1059   HDL 29 (L) 05/11/2019 1045   CHOLHDL 3.5 05/11/2019 1045   VLDL 19 05/11/2019 1045   LDLCALC 54 05/11/2019 1045    Hepatic Function Latest Ref Rng & Units 07/20/2020 06/06/2020 05/09/2020  Total Protein 6.5 - 8.1 g/dL 7.3 6.9 7.5  Albumin 3.5 - 5.0 g/dL 4.1 4.2 4.6  AST 15 - 41 U/L 20 54(H) 19  ALT 0 - 44 U/L 12 52(H) 15  Alk Phosphatase 38 - 126 U/L 146(H) 202(H) 138(H)  Total Bilirubin 0.3 - 1.2 mg/dL 0.3 0.4 0.4  Bilirubin, Direct 0.0 - 0.3 mg/dL - - -    Lab Results  Component Value Date/Time   TSH 7.708 (H) 07/20/2019 08:05 PM   TSH 4.43 06/26/2010 01:55 PM   TSH 1.05 02/15/2009 10:23 AM   FREET4 0.68 07/20/2019 08:30 PM    CBC Latest Ref Rng & Units 07/20/2020 06/06/2020 05/09/2020  WBC 4.0 - 10.5 K/uL 6.8 4.5 6.3  Hemoglobin 12.0 - 15.0 g/dL 10.9(L) 10.4(L) 11.8(L)  Hematocrit 36.0 - 46.0 % 33.9(L) 32.3(L) 37.1  Platelets 150 - 400 K/uL 330 174 214    Lab Results  Component Value Date/Time   VD25OH 16.46 (L) 09/06/2015 02:16 PM   VD25OH 4.8 (L) 05/20/2014 03:33 PM   VD25OH 15 (L) 12/12/2011 06:45 PM  Clinical ASCVD: Yes  The ASCVD Risk score Mikey Bussing DC Jr., et al., 2013) failed to calculate for the following reasons:   The valid total cholesterol range is 130 to 320 mg/dL    Social History   Tobacco Use  Smoking Status Former Smoker  . Packs/day: 1.00  . Years: 20.00  . Pack years: 20.00  . Quit date: 03/19/1986  . Years since quitting: 34.3  Smokeless Tobacco Never Used   BP Readings from Last 3 Encounters:  07/20/20 130/74  07/05/20 (!) 150/76  06/23/20 140/90   Pulse Readings from Last  3 Encounters:  07/20/20 85  07/05/20 (!) 110  06/06/20 82   Wt Readings from Last 3 Encounters:  07/05/20 87 lb 3.2 oz (39.6 kg)  06/23/20 88 lb (39.9 kg)  05/09/20 87 lb 14.4 oz (39.9 kg)    Assessment: Review of patient past medical history, allergies, medications, health status, including review of consultants reports, laboratory and other test data, was performed as part of comprehensive evaluation and provision of chronic care management services.   SDOH:  (Social Determinants of Health) assessments and interventions performed:  SDOH Interventions   Flowsheet Row Most Recent Value  SDOH Interventions   Financial Strain Interventions --  [Patient approved for PAP with [employer] for Incruse,  awaiting her to meet OOP expense for assistance with BMS for Eliquis]      CCM Care Plan  Allergies  Allergen Reactions  . Dabigatran Etexilate Mesylate Other (See Comments)    INTERNAL BLEEDING- Pradaxa  . Augmentin [Amoxicillin-Pot Clavulanate] Diarrhea and Nausea Only    Did it involve swelling of the face/tongue/throat, SOB, or low BP? No Did it involve sudden or severe rash/hives, skin peeling, or any reaction on the inside of your mouth or nose? No Did you need to seek medical attention at a hospital or doctor's office? No When did it last happen?02/2019 If all above answers are "NO", may proceed with cephalosporin use.   [name] [Pentazocine] Other (See Comments)    Hallucinations     Medications Reviewed Today    Reviewed by Cherre Robins, PharmD (Pharmacist) on 07/26/20 at 1138  Med List Status: <None>  Medication Order Taking? Sig Documenting Provider Last Dose Status Informant  acetaminophen (TYLENOL) 650 MG CR tablet [id number] Yes Take 650 mg by mouth every 8 (eight) hours as needed for pain. [provider] Taking Active Spouse/Significant Other  albuterol (VENTOLIN HFA) 108 (90 Base) MCG/ACT inhaler [id number] Yes Inhale 2 puffs into the lungs every 6 (six) hours  as needed for wheezing or shortness of breath. [provider] Taking Active Spouse/Significant Other           Med Note Antony Contras, Rahmah Mccamy B   Mon Jun 20, 2020 11:19 AM) No longer getting from PAP - now available generically  amLODipine (NORVASC) 2.5 MG tablet [id number] Yes TAKE 1 TABLET BY MOUTH AT BEDTIME Minus Breeding, MD Taking Active   apixaban (ELIQUIS) 5 MG TABS tablet [id number] Yes Take 1 tablet (5 mg total) by mouth 2 (two) times daily. Carollee Herter, Kendrick Fries R, DO Taking Active   atorvastatin (LIPITOR) 20 MG tablet [id number] Yes TAKE 1 TABLET BY MOUTH ONCE DAILY AT  6PM  Patient taking differently: TAKE 1 TABLET BY MOUTH ONCE DAILY AT  [address] R, DO Taking Active   azithromycin (ZITHROMAX) 500 MG tablet [id number] No TAKE 1 TABLETS BY MOUTH THREE TIMES A WEEK  Patient not taking: Reported on 07/26/2020  Carlyle Basques, MD Not Taking Consider Medication Status and Discontinue Spouse/Significant Other  ethambutol (MYAMBUTOL) 100 MG tablet [id number] No TAKE 2 TABLETS BY MOUTH THREE TIMES A WEEK. TAKE WITH 2 TABLETS OF 400 MG OF ETHAMBUTOL TO EQUAL 1000 MG TOTAL  Patient not taking: Reported on 07/26/2020   Carlyle Basques, MD Not Taking Consider Medication Status and Discontinue Spouse/Significant Other  ethambutol (MYAMBUTOL) 400 MG tablet [id number] No TAKE 2 TABLETS BY MOUTH THREE TIMES A WEEK **TAKE WITH 2 TABLETS OF 100 MG OF ETHAMBUTOL TO EQUAL 1000 MG TOTAL**  Patient not taking: Reported on 07/26/2020   Carlyle Basques, MD Not Taking Consider Medication Status and Discontinue Spouse/Significant Other  fluconazole (DIFLUCAN) 150 MG tablet [id number] No Take 1 tablet by mouth daily.  Patient not taking: Reported on 07/26/2020   [provider] Not Taking Active   furosemide (LASIX) 40 MG tablet [id number] Yes TAKE 1 TABLET BY MOUTH IN THE [name], MD Taking Active   gabapentin (NEURONTIN) 600 MG tablet [id number] Yes 1 po qid Roma Schanz R, DO Taking Active Spouse/Significant Other        Discontinued 06/05/11 1433 (Error)   lidocaine-prilocaine (EMLA) cream [id number] Yes Apply 1 application topically as needed.  Patient taking differently: Apply 1 application topically as needed (for port access- to numb).   Volanda Napoleon, MD Taking Active   nitrofurantoin (MACRODANTIN) 100 MG capsule [id number] Yes Take 100 mg by mouth daily. [provider] Taking Active Spouse/Significant Other  nitroGLYCERIN (NITROSTAT) 0.4 MG SL tablet [id number] Yes Place 1 tablet (0.4 mg total) under the tongue every 5 (five) minutes as needed. CHEST PAIN Minus Breeding, MD Taking Active Spouse/Significant Other  oxyCODONE-acetaminophen (PERCOCET) 10-325 MG tablet [id number] Yes Take 1 tablet by mouth every 4 (four) hours as needed. PAIN Ann Held, DO Taking Active   OXYGEN [id number] Yes Inhale 3 L/min into the lungs continuous.  [provider] Taking Active Spouse/Significant Other  pantoprazole (PROTONIX) 40 MG tablet [id number] Yes Take 1 tablet by mouth twice daily Gatha Mayer, MD Taking Active Spouse/Significant Other  rifabutin (MYCOBUTIN) 150 MG capsule [id number] No TAKE 2 CAPSULES (300 MG TOTAL) BY MOUTH THREE TIMES A WEEK  Patient not taking: Reported on 07/26/2020   Carlyle Basques, MD Not Taking Consider Medication Status and Discontinue Spouse/Significant Other  tiZANidine (ZANAFLEX) 4 MG tablet [id number] Yes Take 1 tablet by mouth three times daily as needed for muscle spasm Carollee Herter, Alferd Apa, DO Taking Active   triamcinolone ointment (KENALOG) 0.1 % [id number] Yes Apply 1 application topically daily as needed (to affected areas- for psoriasis).  [provider] Taking Active Spouse/Significant Other  umeclidinium bromide (INCRUSE ELLIPTA) 62.5 MCG/INH AEPB [id number] Yes Inhale 1 puff into the lungs daily. Ann Held, DO Taking Active            Med Note Antony Contras, [place] Jul 26, 2020 11:11 AM) Approved to received [employer] patient assistance 07/13/2020 to 03/18/2021  zolpidem (AMBIEN) 5 MG tablet [id number] Yes TAKE 1 TABLET BY MOUTH EVERY DAY AT BEDTIME AS NEEDED Shelda Pal, DO Taking Active           Patient Active Problem List   Diagnosis Date Noted  . Hematoma 08/07/2019  . Other neutropenia ([place])   . Post-procedural fever 07/20/2019  . Acute febrile illness 07/16/2019  . Pulmonary MAI (mycobacterium avium-intracellulare) infection ([place]) 06/24/2019  . History of total right hip replacement 05/27/2019  .  NICM (nonischemic cardiomyopathy) ([place]) 05/19/2019  . Educated about COVID-19 virus infection 09/07/2018  . Erythropoietin deficiency anemia 06/19/2018  . Swelling of calf 02/04/2018  . Bad odor of urine 12/09/2017  . Iron deficiency anemia due to chronic blood loss 08/14/2017  . Iron malabsorption 08/14/2017  . Hyperlipidemia LDL goal <100 07/08/2017  . Elevated ALT measurement   . Hypotension 06/20/2017  . HLD (hyperlipidemia) 06/20/2017  . CAD (coronary artery disease) 06/20/2017  . Protein-calorie malnutrition, severe ([place]) 06/20/2017  . Generalized weakness   . Weakness 05/30/2017  . Focal neurological deficit 05/30/2017  . Chronic diastolic CHF (congestive heart failure) ([place]) 05/30/2017  . TIA (transient ischemic attack) 05/30/2017  . AKI (acute kidney injury) ([place]) 05/30/2017  . Abnormal LFTs 05/30/2017  . Bronchiectasis ([place]) 04/22/2017  . Right otitis media 07/17/2016  . Cerumen impaction 07/17/2016  . Osteoporosis 03/16/2016  . Headache 11/20/2014  . Skin infection 05/27/2014  . Insomnia 05/27/2014  . Abdominal wall pain 07/09/2013  . Other dysphagia 02/02/2013  . Platelets decreased ([place]) 01/29/2012  . Vitamin D deficiency 12/13/2011  . Hypokalemia 12/13/2011  . Hypocalcemia 12/12/2011  . Adhesions of intestine, very dense, with frozen abdomen 12/11/2011  . SBO (small bowel obstruction), recurrent 12/11/2011   . Protein-calorie malnutrition, moderate ([place]) 12/11/2011  . Oxygen dependent   . Hx of gastric ulcer   . UTI (urinary tract infection) 10/15/2011  . Allergic conjunctivitis 10/15/2011  . Degenerative arthritis of hip 08/03/2011  . B12 deficiency 07/02/2011  . Bilateral leg weakness 01/16/2011  . Anemia, chronic disease 09/14/2010  . Recurrent pneumonia 08/11/2010  . CRT- [employer] 06/26/2010  . DYSURIA 05/19/2010  . Chronic hypoxemic respiratory failure ([place]) 02/21/2010  . DEHYDRATION 01/06/2010  . GASTROENTERITIS 01/06/2010  . Congestive dilated cardiomyopathy ([place]) 11/17/2009  . LBBB 11/17/2009  . SYSTOLIC HEART FAILURE, CHRONIC 11/17/2009  . GERD (gastroesophageal reflux disease) 02/17/2009  . NONSPECIFIC ABN FINDNG RAD&OTH EXAM BILARY TRCT 09/03/2008  . COPD with emphysema ([place]) 06/28/2008  . DIVERTICULOSIS, COLON 06/03/2008  . GASTRITIS 06/02/2008  . Atrial fibrillation/flutter 12/08/2007  . GASTROPARESIS 08/07/2007  . NAUSEA, CHRONIC 08/07/2007  . Abdominal pain 08/07/2007  . SYNCOPE 02/17/2007  . RESTLESS LEG SYNDROME 08/01/2006  . Chronic pain syndrome 08/01/2006  . OSTEOARTHRITIS 07/31/2006  . Degenerative disc disease, lumbar 07/31/2006  . Fibromyalgia 07/31/2006  . CHRONIC FATIGUE SYNDROME 07/31/2006  . URINARY INCONTINENCE 07/31/2006  . ANEMIA-IRON DEFICIENCY 05/01/2006  . Essential hypertension 05/01/2006    Immunization History  Administered Date(s) Administered  . DTaP 11/03/1996, 01/04/1997, 05/03/1997, 10/19/1997, 08/05/2001  . Fluad Quad(high Dose 65+) 11/06/2018, 12/31/2019  . HPV Quadrivalent 09/05/2009, 09/13/2010, 09/26/2011  . Hepatitis A, Ped/Adol-2 Dose 09/21/2004, 03/29/2005  . Hepatitis B, ped/adol 04/30/1996, 11/03/1996, 10/19/1997  . HiB (PRP-T) 11/03/1996, 01/04/1997, 05/03/1997, 10/19/1997  . IPV 11/03/1996, 01/04/1997, 05/03/1997, 08/05/2001  . Influenza Split 01/16/2011, 12/12/2011  . Influenza Whole 01/15/2007, 12/08/2007,  12/28/2008, 12/17/2009  . Influenza, High Dose Seasonal PF 11/28/2015, 12/07/2016, 11/15/2017  . Influenza, Seasonal, Injecte, Preservative Fre 12/09/2012  . Influenza,Quad,Nasal, Live 03/17/2007, 03/06/2009, 01/08/2010, 01/30/2011, 02/09/2012, 01/18/2013  . Influenza,inj,Quad PF,6+ Mos 03/29/2005, 04/30/2005, 03/01/2006, 01/28/2008, 12/01/2013, 01/16/2014, 12/22/2014, 12/28/2014  . MMR 05/03/1997, 08/05/2001  . Meningococcal B, OMV 12/22/2014  . Pneumococcal Conjugate-13 06/10/2014, 11/15/2014  . Pneumococcal Polysaccharide-23 12/17/2009  . Tdap 09/17/2007, 05/15/2014, 12/09/2018  . Varicella 11/16/1997, 05/18/2008  . Zoster 12/31/2013    Conditions to be addressed/monitored: CHF, HTN, HLD, COPD, Pulmonary Disease and GERD, insomnia; chronic pain; CAD  Care Plan : General  Pharmacy (Adult)  Updates made by Cherre Robins, PHARMD since 07/26/2020 12:00 AM    Problem: Afib, COPD, HF, HTN, HLD, CAD, Chronic Pain Syndrome, GERD, Insomnia   Priority: High  Onset Date: 05/10/2020    Long-Range Goal: Patient-Specific Goal   Start Date: 05/10/2020  Expected End Date: 09/23/2020  Recent Progress: On track  Priority: High  Note:   Current Barriers:  . Unable to independently afford treatment regimen . Chronic Disease Management support, education, and care coordination needs related to Afib, COPD, HF, HTN, HLD, CAD, Chronic Pain Syndrome, GERD, Insomnia  Pharmacist Clinical Goal(s):  [name] Over the next 90 days, patient will verbalize ability to afford treatment regimen . achieve adherence to monitoring guidelines and medication adherence to achieve therapeutic efficacy . contact provider office for questions/concerns as evidenced notation of same in electronic health record through collaboration with PharmD and provider.   Interventions: . 1:1 collaboration with Carollee Herter, Alferd Apa, DO regarding development and update of comprehensive plan of care as evidenced by provider attestation and  co-signature . Inter-disciplinary care team collaboration (see longitudinal plan of care) . Comprehensive medication review performed; medication list updated in electronic medical record  Hypertension (BP goal <140/90) - Controlled BP Readings from Last 3 Encounters:  07/20/20 130/74  07/05/20 (!) 150/76  06/23/20 140/90   . Current treatment: o Amlodipine 2.31m daily in the evening . Medications previously tried: none noted  . Current home readings: checks infrequently, noted BP usually < 140/80 . Pharmacist interventions:  o Educated on BP goals and benefits of medications for prevention of heart attack, stroke and kidney damage; Reminded about the importance of home blood pressure monitoring; o Recommended to continue current medication.    Atrial Fibrillation (Goal: prevent stroke and major bleeding) . Controlled  . Current treatment: o Anticoagulation: Eliquis 5576mtwice daily (per Dr. LoEtter Sjogren. Medications previously tried: amiodarone, carvedilol . Patient is in Medicare coverage gap but has to spend $675 (3% of household income) on medications per manufacturer assistance program specifications) . Intervention:  o Discussed amount that manufacturer assistance program estimated patient will needed to spend to qualify to received Eliquis through their program with her caregiver (husband). He will check insurance reports to see if she had met this yet and send reports to me once she had.  o Maintain Eliquis therapy o Patient to notify office once meets 3% out of pocket spend [phone] we will forward report to patient assistance program  COPD with past MAI infection (Goal: control symptoms and prevent exacerbations) . Controlled . Completed antibiotics for MAI infection in April 2022.  . Current treatment  o Incruse ellipta 62.76m46mnh once daily (per Sood) o Proventil (per [place] Medications previously tried: none noted  . Gold Grade: Gold 2 (FEV1 50-79%) . Current COPD  Classification:  B (high sx, <2 exacerbations/yr) . Pulmonary function testing: 10/10/2011-pulmonary function test-FVC 1.47 (54% predicted), postbronchodilator ratio 64, postbronchodilator FEV1 1.15 (59% predicted), positive bronchodilator response, DLCO 5.3 (35% predicted) . Exacerbations requiring treatment in last 6 months: none . Patient reports consistent use of maintenance inhaler . Frequency of rescue inhaler use: as needed; usually 1 or 2 times per week . Pharmacist Interventions:  o Recommended continue current inhalers for COPD and antibiotics / antiinfectives for MAI per ID physicians.  o PAP application for Incruse has been approved. Patient was supposed to received 07/25/2020 but has not. Tracking number is [id number] [email or web address] reports should be delivered today 07/26/2020.   o  Patient is taking nitrofurantoin daily for UTI prevention. Consider risk versus benefits in patient with COPD given that nitrofurantoin is associated with pulmonary injury.   Pain (Goal: Minimize symptoms) . Controlled . Current treatment  o Gabapentin 665m 4 times daily o Oxycodone 10-3280m4 times daily o Tizanidine 58m37mID (only takes BID) o Acetaminophen 325m107mtake 2 tabs PRN - uses every other day . Medications previously tried: none noted . Pharmacist interventions:  o Recommended to continue current medication  Osteoporosis  . Goal: reduce risk of fracture due to osteoporosis  . Current regimen:  o None . Interventions: o Reviewed benefits of Prolia, but will defer until patient has completed MAI treatment and DEXA rechecked   Health Maintenance  . Interventions: o Discussed consideration of completing Shingrix vaccine series (would wait until completion of MAI abx treatment) o Discussed consideration of completing COVID Vaccine Series . Patient self care activities - Over the next year, patient will: o Consider completion of Shingrix vaccine series (after  completion of MAI treatment) o Consider completion of COVID Vaccine series  Patient Goals/Self-Care Activities . Over the next 90 days, patient will:  o take medications as prescribed o check blood pressure periodically, document, and provide at future appointments o collaborate with provider on medication access solutions  Follow Up Plan: The care management team will reach out to the patient again over the next 90 days.       Medication Assistance: Approved for assistance for Incruse thru [employer] [place] program; awaiting deliver of first shipment - should arrive today 07/26/20; Application for Eliquis has been sent and reviewed - patient has to been OOP spend of 3% of income or $675.   Patient's [facility]:  [facility]: [phone]: [phone] Up:  Patient agrees to Care Plan and Follow-up.  Plan: Telephone follow up appointment with care management team member scheduled for:  2 to 3 months  TammCherre RobinsarmD Clinical Pharmacist [facility]

## 2020-07-26 NOTE — Patient Instructions (Signed)
Visit Information  PATIENT GOALS: Goals Addressed            This Visit's Progress   . Chronic Care Management Pharmacy Care Plan   On track    CARE PLAN ENTRY (see longitudinal plan of care for additional care plan information)  Current Barriers:  . Chronic Disease Management support, education, and care coordination needs related to Afib, COPD, HF, HTN, HLD, CAD, Chronic Pain Syndrome, GERD, Insomnia   Hypertension BP Readings from Last 3 Encounters:  07/20/20 130/74  07/05/20 (!) 150/76  06/23/20 140/90   . Pharmacist Clinical Goal(s): o Over the next 90 days, patient will work with PharmD and providers to achieve BP goal <140/90 . Current regimen:  o Amlodipine 2.85m daily each evening . Intervention:  o Discussed blood pressure goals . Patient self care activities - Over the next 90 days, patient will: o Maintain hypertension medication regimen.  o Check blood pressure at home 2 to 3 times per week, record and bring to office appointments  Atrial Fibrillation:  . Pharmacist Clinical Goal(s): o Over the next 90 days, patient will work with PharmD and providers to improve access to Eliquis (patient is in Medicare coverage gap but has to spend $675 (3% of household income) on medications per manufacturer assistance program specifications) . Current regimen:  o Eliquis 548mtwice a day . Intervention:  o Discussed amount that manufacturer assistance program estimated patient will needed to spend to qualify to received Eliquis through their program with her caregiver (husband). He will check insurance reports to see if she had met this yet and send reports to me once she had.  . Patient self care activities - Over the next 90 days, patient will: o Maintain Eliquis therapy o Notify office once you meet 3% out of pocket spend [phone] we will forward report to patient assistance program.    Hyperlipidemia:   Lab Results  Component Value Date/Time   LDLCALC 54 05/11/2019  10:45 AM   . Pharmacist Clinical Goal(s): o Over the next 90 days, patient will work with PharmD and providers to maintain LDL goal < 70 . Current regimen:  o Atorvastatin 209maily . Patient self care activities - Over the next 90 days, patient will: o Maintain cholesterol medication regimen.   COPD . Pharmacist Clinical Goal(s) o Over the next 90 days, patient will work with PharmD and providers to reduce symptoms associated with COPD and reduce barrier to receiving medication . Current regimen:   Incruse ellipta 62.5mg50mh once daily   Proventil  . Interventions: o Verified that patient was approved for patient assitance for Incruse on 07/13/20. She has not received shipment yet. Tracking number is [id number] [employer] [place] and is supposed to be delivered today 07/26/2020.  . PaMarland Kitchenient self care activities - Over the next 90 days, patient will: o Continue current therapy o Notify clinical pharmacist once you have received delivery of Incruse.  Osteoporosis  . Pharmacist Clinical Goal(s) o Over the next 180 days, patient will work with PharmD and providers to reduce risk of fracture due to osteoporosis  . Current regimen:  o None . Interventions: o Reviewed benefits of Prolia, but will defer until patient has completed MAI treatment and DEXA rechecked . Patient self care activities - Over the next 180 days, patient will: o Consider restarting Prolia pending next bone density results.   Health Maintenance  . Pharmacist Clinical Goal(s) o Over the next year, patient will work  with PharmD and providers to complete health maintenance screenings/vaccinations . Interventions: o Discussed consideration of completing Shingrix vaccine series (would wait until completion of MAI abx treatment) o Discussed consideration of completing COVID Vaccine Series . Patient self care activities - Over the next year, patient will: o Consider  completion of Shingrix vaccine series (after completion of MAI treatment) o Consider completion of COVID Vaccine series  Medication management . Pharmacist Clinical Goal(s): o Over the next 90 days, patient will work with PharmD and providers to maintain optimal medication adherence . Current pharmacy: [facility]  . Interventions o Comprehensive medication review performed. o Approved for PAP for Incruse; awaiting patient to meet out of pocket spend requirement for Eliquis.  . Patient self care activities - Over the next 90 days, patient will: o Focus on medication adherence by filling and taking medications appropriately  o Take medications as prescribed o Report any questions or concerns to PharmD and/or provider(s) o Bring in financial documentation for Eliquis patient assistance application  Please see past updates related to this goal by clicking on the "Past Updates" button in the selected goal        The patient verbalized understanding of instructions, educational materials, and care plan provided today and declined offer to receive copy of patient instructions, educational materials, and care plan.   Telephone follow up appointment with care management team member scheduled for: 2 to 3 months  Cherre Robins, PharmD Clinical Pharmacist [place] [facility]

## 2020-07-29 ENCOUNTER — Other Ambulatory Visit: Payer: Self-pay | Admitting: Family Medicine

## 2020-07-29 DIAGNOSIS — R52 Pain, unspecified: Secondary | ICD-10-CM

## 2020-07-29 MED ORDER — OXYCODONE-ACETAMINOPHEN 10-325 MG PO TABS: 1.0000 | ORAL_TABLET | ORAL | 0 refills | Status: DC | PRN

## 2020-07-29 NOTE — Telephone Encounter (Signed)
Medication:oxyCODONE-acetaminophen (PERCOCET) 10-325 MG tablet [[id number]]       Has the patient contacted their pharmacy? no (If no, request that the patient contact the pharmacy for the refill.) (If yes, when and what did the pharmacy advise?)    Preferred Pharmacy (with phone number or street name):   [facility], [address] [place], [place] [address]  Phone:  [phone] Fax:  [phone]     Agent: Please be advised that RX refills may take up to 3 business days. We ask that you follow-up with your pharmacy.

## 2020-07-29 NOTE — Telephone Encounter (Signed)
Requesting: Percocet Contract: 04/25/2020 UDS: 04/25/2020 Last OV: 07/22/2020 Next OV: N/A Last Refill: 07/05/2020, #120--0 RF Database:   Please advise

## 2020-08-01 ENCOUNTER — Other Ambulatory Visit: Payer: Self-pay | Admitting: Family Medicine

## 2020-08-01 DIAGNOSIS — R52 Pain, unspecified: Secondary | ICD-10-CM

## 2020-08-02 ENCOUNTER — Other Ambulatory Visit: Payer: Self-pay | Admitting: Internal Medicine

## 2020-08-08 ENCOUNTER — Ambulatory Visit: Payer: HMO | Admitting: Internal Medicine

## 2020-08-11 ENCOUNTER — Telehealth: Payer: HMO

## 2020-08-12 ENCOUNTER — Other Ambulatory Visit: Payer: Self-pay

## 2020-08-12 ENCOUNTER — Telehealth: Payer: Self-pay

## 2020-08-12 MED ORDER — AZITHROMYCIN 500 MG PO TABS: ORAL_TABLET | ORAL | 0 refills | Status: DC

## 2020-08-12 MED ORDER — ETHAMBUTOL HCL 100 MG PO TABS: ORAL_TABLET | ORAL | 0 refills | Status: DC

## 2020-08-12 MED ORDER — RIFABUTIN 150 MG PO CAPS
ORAL_CAPSULE | ORAL | 0 refills | Status: DC
Start: 2020-08-12 — End: 2020-08-24

## 2020-08-12 MED ORDER — ETHAMBUTOL HCL 400 MG PO TABS: ORAL_TABLET | ORAL | 0 refills | Status: DC

## 2020-08-12 NOTE — Telephone Encounter (Signed)
Reviewed chart and see that these meds should be refilled. Will discuss more when she is seen in June./ [facility]

## 2020-08-12 NOTE — Telephone Encounter (Signed)
Patient is requesting refills on 4 antibiotics. She states Dr Baxter Flattery will not want her off of her abx and reports she has missed 7 days worth. I see next to the medications it is marked as not taking. Just want to se if we need to refill Azithromycin, ethambutol 100 and 400 mg, and Rifabutin. Patient was given refills 05/2020 with zero refills so I am not sure how she still had meds or if these need to be given again. Please advise.

## 2020-08-15 ENCOUNTER — Other Ambulatory Visit: Payer: Self-pay | Admitting: Family Medicine

## 2020-08-15 DIAGNOSIS — G4700 Insomnia, unspecified: Secondary | ICD-10-CM

## 2020-08-16 ENCOUNTER — Other Ambulatory Visit: Payer: Self-pay | Admitting: Family

## 2020-08-16 DIAGNOSIS — D631 Anemia in chronic kidney disease: Secondary | ICD-10-CM

## 2020-08-16 NOTE — Telephone Encounter (Signed)
Requesting: Ambien  Contract: 04/25/20 UDS: 04/25/20 Last OV: 07/22/2020 Next OV: N/A Last Refill: 07/15/2020, #30--0 RF Database:   Please advise

## 2020-08-17 ENCOUNTER — Inpatient Hospital Stay: Payer: HMO

## 2020-08-17 ENCOUNTER — Other Ambulatory Visit: Payer: Self-pay

## 2020-08-17 ENCOUNTER — Inpatient Hospital Stay: Payer: HMO | Attending: Family

## 2020-08-17 DIAGNOSIS — N189 Chronic kidney disease, unspecified: Secondary | ICD-10-CM | POA: Insufficient documentation

## 2020-08-17 DIAGNOSIS — D500 Iron deficiency anemia secondary to blood loss (chronic): Secondary | ICD-10-CM

## 2020-08-17 DIAGNOSIS — D631 Anemia in chronic kidney disease: Secondary | ICD-10-CM | POA: Insufficient documentation

## 2020-08-17 LAB — CBC WITH DIFFERENTIAL (CANCER CENTER ONLY)
Abs Immature Granulocytes: 0.02 10*3/uL (ref 0.00–0.07)
Basophils Absolute: 0 10*3/uL (ref 0.0–0.1)
Basophils Relative: 1 %
Eosinophils Absolute: 0.3 10*3/uL (ref 0.0–0.5)
Eosinophils Relative: 3 %
HCT: 39.3 % (ref 36.0–46.0)
Hemoglobin: 12.5 g/dL (ref 12.0–15.0)
Immature Granulocytes: 0 %
Lymphocytes Relative: 8 %
Lymphs Abs: 0.6 10*3/uL — ABNORMAL LOW (ref 0.7–4.0)
MCH: 29 pg (ref 26.0–34.0)
MCHC: 31.8 g/dL (ref 30.0–36.0)
MCV: 91.2 fL (ref 80.0–100.0)
Monocytes Absolute: 0.7 10*3/uL (ref 0.1–1.0)
Monocytes Relative: 9 %
Neutro Abs: 6 10*3/uL (ref 1.7–7.7)
Neutrophils Relative %: 79 %
Platelet Count: 265 10*3/uL (ref 150–400)
RBC: 4.31 MIL/uL (ref 3.87–5.11)
RDW: 14 % (ref 11.5–15.5)
WBC Count: 7.7 10*3/uL (ref 4.0–10.5)
nRBC: 0 % (ref 0.0–0.2)

## 2020-08-17 LAB — CMP (CANCER CENTER ONLY)
ALT: 13 U/L (ref 0–44)
AST: 21 U/L (ref 15–41)
Albumin: 4.4 g/dL (ref 3.5–5.0)
Alkaline Phosphatase: 106 U/L (ref 38–126)
Anion gap: 8 (ref 5–15)
BUN: 19 mg/dL (ref 8–23)
CO2: 32 mmol/L (ref 22–32)
Calcium: 9 mg/dL (ref 8.9–10.3)
Chloride: 99 mmol/L (ref 98–111)
Creatinine: 0.79 mg/dL (ref 0.44–1.00)
GFR, Estimated: >60 mL/min (ref >60)
Glucose, Bld: 101 mg/dL — ABNORMAL HIGH (ref 70–99)
Potassium: 4 mmol/L (ref 3.5–5.1)
Sodium: 139 mmol/L (ref 135–145)
Total Bilirubin: 0.3 mg/dL (ref 0.3–1.2)
Total Protein: 7.3 g/dL (ref 6.5–8.1)

## 2020-08-17 MED ORDER — EPOETIN ALFA-EPBX 40000 UNIT/ML IJ SOLN: 40000.0000 [IU] | Freq: Once | INTRAMUSCULAR | Status: DC

## 2020-08-17 NOTE — Patient Instructions (Signed)

## 2020-08-17 NOTE — Progress Notes (Signed)
Patient was given retacrit today per 5/4 lab work HGB at 10.9 not 08/17/2020 labwork. Okayed per pharmacy via secure chat as [facility] wasn't updating. Patient okayed to go ahead and give retacrit. Pharmacy rechecked labwork after patient d/c'd as MAR still wasn't updating and HGB resulted for 12.5. Charge RN aware.

## 2020-08-24 ENCOUNTER — Encounter: Payer: Self-pay | Admitting: Internal Medicine

## 2020-08-24 ENCOUNTER — Other Ambulatory Visit: Payer: Self-pay

## 2020-08-24 ENCOUNTER — Ambulatory Visit: Payer: HMO | Admitting: Internal Medicine

## 2020-08-24 VITALS — BP 132/77 | HR 86 | Wt 85.8 lb

## 2020-08-24 DIAGNOSIS — J471 Bronchiectasis with (acute) exacerbation: Secondary | ICD-10-CM

## 2020-08-24 DIAGNOSIS — R630 Anorexia: Secondary | ICD-10-CM | POA: Diagnosis not present

## 2020-08-24 DIAGNOSIS — A310 Pulmonary mycobacterial infection: Secondary | ICD-10-CM

## 2020-08-24 MED ORDER — AMOXICILLIN 500 MG PO CAPS: 500.0000 mg | ORAL_CAPSULE | Freq: Three times a day (TID) | ORAL | 0 refills | Status: DC

## 2020-08-24 NOTE — Progress Notes (Signed)
RFV: follow up for pulmonary MAC  Patient ID: [name], female   DOB: 08-Aug-1945, 75 y.o.   MRN: [id number]  HPI [name] is a 75yo F with hx of bronchiectasis and pulmonary MAc. She has been on treatment with Azithromycin, ethambutol and rifabutin. Also On 3-4L supplemental oxygen. She is Having 2-3 weeks of croupy cough with fatigue. Some phlegm production.  Has had weight loss of late   Outpatient Encounter Medications as of 08/24/2020  Medication Sig   albuterol (VENTOLIN HFA) 108 (90 Base) MCG/ACT inhaler Inhale 2 puffs into the lungs every 6 (six) hours as needed for wheezing or shortness of breath.   azithromycin (ZITHROMAX) 500 MG tablet TAKE 1 TABLETS BY MOUTH THREE TIMES A WEEK   ethambutol (MYAMBUTOL) 100 MG tablet TAKE 2 TABLETS BY MOUTH THREE TIMES A WEEK. TAKE WITH 2 TABLETS OF 400 MG OF ETHAMBUTOL TO EQUAL 1000 MG TOTAL   gabapentin (NEURONTIN) 600 MG tablet Take 1 tablet (600 mg total) by mouth in the morning, at noon, in the evening, and at bedtime.   rifabutin (MYCOBUTIN) 150 MG capsule TAKE 2 CAPSULES (300 MG TOTAL) BY MOUTH THREE TIMES A WEEK   umeclidinium bromide (INCRUSE ELLIPTA) 62.5 MCG/INH AEPB Inhale 1 puff into the lungs daily.   zolpidem (AMBIEN) 5 MG tablet TAKE 1 TABLET BY MOUTH EVERY DAY AT BEDTIME AS NEEDED   acetaminophen (TYLENOL) 650 MG CR tablet Take 650 mg by mouth every 8 (eight) hours as needed for pain.   amLODipine (NORVASC) 2.5 MG tablet TAKE 1 TABLET BY MOUTH AT BEDTIME   apixaban (ELIQUIS) 5 MG TABS tablet Take 1 tablet (5 mg total) by mouth 2 (two) times daily.   atorvastatin (LIPITOR) 20 MG tablet TAKE 1 TABLET BY MOUTH ONCE DAILY AT  6PM (Patient taking differently: TAKE 1 TABLET BY MOUTH ONCE DAILY AT  6PM)   ethambutol (MYAMBUTOL) 400 MG tablet TAKE 2 TABLETS BY MOUTH THREE TIMES A WEEK **TAKE WITH 2 TABLETS OF 100 MG OF ETHAMBUTOL TO EQUAL 1000 MG TOTAL**   fluconazole (DIFLUCAN) 150 MG tablet Take 1 tablet by mouth daily. (Patient  not taking: Reported on 07/26/2020)   furosemide (LASIX) 40 MG tablet TAKE 1 TABLET BY MOUTH IN THE MORNING   lidocaine-prilocaine (EMLA) cream Apply 1 application topically as needed. (Patient taking differently: Apply 1 application topically as needed (for port access- to numb).)   nitrofurantoin (MACRODANTIN) 100 MG capsule Take 100 mg by mouth daily.   nitroGLYCERIN (NITROSTAT) 0.4 MG SL tablet Place 1 tablet (0.4 mg total) under the tongue every 5 (five) minutes as needed. CHEST PAIN   oxyCODONE-acetaminophen (PERCOCET) 10-325 MG tablet Take 1 tablet by mouth every 4 (four) hours as needed. PAIN   OXYGEN Inhale 3 L/min into the lungs continuous.    pantoprazole (PROTONIX) 40 MG tablet Take 1 tablet by mouth twice daily   tiZANidine (ZANAFLEX) 4 MG tablet Take 1 tablet by mouth three times daily as needed for muscle spasm   triamcinolone ointment (KENALOG) 0.1 % Apply 1 application topically daily as needed (to affected areas- for psoriasis).    [DISCONTINUED] iron dextran complex (INFED) 50 MG/ML injection Please give infed infusion (no test dose needed) over 4 hours per pharmacy calculated dose. Ht: 5'2 Wt: 99 pounds Hgb: 12   No facility-administered encounter medications on file as of 08/24/2020.     Patient Active Problem List   Diagnosis Date Noted   Hematoma 08/07/2019   Other neutropenia ([place])  Post-procedural fever 07/20/2019   Acute febrile illness 07/16/2019   Pulmonary MAI (mycobacterium avium-intracellulare) infection ([place]) 06/24/2019   History of total right hip replacement 05/27/2019   NICM (nonischemic cardiomyopathy) ([place]) 05/19/2019   Educated about COVID-19 virus infection 09/07/2018   Erythropoietin deficiency anemia 06/19/2018   Swelling of calf 02/04/2018   Bad odor of urine 12/09/2017   Iron deficiency anemia due to chronic blood loss 08/14/2017   Iron malabsorption 08/14/2017   Hyperlipidemia LDL goal <100 07/08/2017   Elevated ALT measurement     Hypotension 06/20/2017   HLD (hyperlipidemia) 06/20/2017   CAD (coronary artery disease) 06/20/2017   Protein-calorie malnutrition, severe ([place]) 06/20/2017   Generalized weakness    Weakness 05/30/2017   Focal neurological deficit 05/30/2017   Chronic diastolic CHF (congestive heart failure) ([place]) 05/30/2017   TIA (transient ischemic attack) 05/30/2017   AKI (acute kidney injury) ([place]) 05/30/2017   Abnormal LFTs 05/30/2017   Bronchiectasis ([place]) 04/22/2017   Right otitis media 07/17/2016   Cerumen impaction 07/17/2016   Osteoporosis 03/16/2016   Headache 11/20/2014   Skin infection 05/27/2014   Insomnia 05/27/2014   Abdominal wall pain 07/09/2013   Other dysphagia 02/02/2013   Platelets decreased ([place]) 01/29/2012   Vitamin D deficiency 12/13/2011   Hypokalemia 12/13/2011   Hypocalcemia 12/12/2011   Adhesions of intestine, very dense, with frozen abdomen 12/11/2011   SBO (small bowel obstruction), recurrent 12/11/2011   Protein-calorie malnutrition, moderate ([place]) 12/11/2011   Oxygen dependent    Hx of gastric ulcer    UTI (urinary tract infection) 10/15/2011   Allergic conjunctivitis 10/15/2011   Degenerative arthritis of hip 08/03/2011   B12 deficiency 07/02/2011   Bilateral leg weakness 01/16/2011   Anemia, chronic disease 09/14/2010   Recurrent pneumonia 08/11/2010   CRT- [employer] 06/26/2010   DYSURIA 05/19/2010   Chronic hypoxemic respiratory failure ([place]) 02/21/2010   DEHYDRATION 01/06/2010   GASTROENTERITIS 01/06/2010   Congestive dilated cardiomyopathy ([place]) 11/17/2009   LBBB 40/34/7425   SYSTOLIC HEART FAILURE, CHRONIC 11/17/2009   GERD (gastroesophageal reflux disease) 02/17/2009   NONSPECIFIC ABN FINDNG RAD&OTH EXAM BILARY TRCT 09/03/2008   COPD with emphysema ([place]) 06/28/2008   DIVERTICULOSIS, COLON 06/03/2008   GASTRITIS 06/02/2008   Atrial fibrillation/flutter 12/08/2007   GASTROPARESIS 08/07/2007   NAUSEA, CHRONIC 08/07/2007   Abdominal pain  08/07/2007   SYNCOPE 02/17/2007   RESTLESS LEG SYNDROME 08/01/2006   Chronic pain syndrome 08/01/2006   OSTEOARTHRITIS 07/31/2006   Degenerative disc disease, lumbar 07/31/2006   Fibromyalgia 07/31/2006   CHRONIC FATIGUE SYNDROME 07/31/2006   URINARY INCONTINENCE 07/31/2006   ANEMIA-IRON DEFICIENCY 05/01/2006   Essential hypertension 05/01/2006     Health Maintenance Due  Topic Date Due   Pneumococcal Vaccine 42-84 Years old (1 of 4 - PCV13) Never done   COVID-19 Vaccine (1) Never done   Zoster Vaccines- Shingrix (1 of 2) Never done   MAMMOGRAM  09/14/2015     Review of Systems 12 point ros is negative except what is mentioned in hpi Physical Exam   BP 132/77   Pulse 86   Wt 85 lb 12.8 oz (38.9 kg)   BMI 16.76 kg/m   Physical Exam  Constitutional:  oriented to person, place, and time. appears well-developed and well-nourished. No distress.  HENT: [place]/AT, PERRLA, no scleral icterus Mouth/Throat: Oropharynx is clear and moist. No oropharyngeal exudate.  Cardiovascular: Normal rate, regular rhythm and normal heart sounds. Exam reveals no gallop and no friction rub.  No murmur heard.  Pulmonary/Chest: Effort normal  and breath sounds normal. No respiratory distress.  Mild rhonchi Neck = supple, no nuchal rigidity Abdominal: Soft. Bowel sounds are normal.  exhibits no distension. There is no tenderness.  Lymphadenopathy: no cervical adenopathy. No axillary adenopathy Neurological: alert and oriented to person, place, and time.  Skin: Skin is warm and dry. No rash noted. No erythema.  Psychiatric: a normal mood and affect.  behavior is normal.    CBC Lab Results  Component Value Date   WBC 7.7 08/17/2020   RBC 4.31 08/17/2020   HGB 12.5 08/17/2020   HCT 39.3 08/17/2020   PLT 265 08/17/2020   MCV 91.2 08/17/2020   MCH 29.0 08/17/2020   MCHC 31.8 08/17/2020   RDW 14.0 08/17/2020   LYMPHSABS 0.6 (L) 08/17/2020   MONOABS 0.7 08/17/2020   EOSABS 0.3 08/17/2020     BMET Lab Results  Component Value Date   NA 139 08/17/2020   K 4.0 08/17/2020   CL 99 08/17/2020   CO2 32 08/17/2020   GLUCOSE 101 (H) 08/17/2020   BUN 19 08/17/2020   CREATININE 0.79 08/17/2020   CALCIUM 9.0 08/17/2020   GFRNONAA >60 08/17/2020   GFRAA >60 12/01/2019      Assessment and Plan  Bronchiectasis flare= will prescribe her amoxicillin  x 7 d  Pulmonary MAC infection= will have her stop abtx to give a break to see if can increase her weight. She has been on therapy >  6 months.   Appetite = likely due to side effect from MAC treatment. Will recommend to increase try ensure, protein drink -- we get 90 lb  rtc 5 wk

## 2020-08-26 ENCOUNTER — Other Ambulatory Visit: Payer: Self-pay

## 2020-08-26 DIAGNOSIS — R52 Pain, unspecified: Secondary | ICD-10-CM

## 2020-08-26 MED ORDER — OXYCODONE-ACETAMINOPHEN 10-325 MG PO TABS: 1.0000 | ORAL_TABLET | ORAL | 0 refills | Status: DC | PRN

## 2020-08-26 NOTE — Telephone Encounter (Signed)
Requesting: Percocet Contract: 04/25/2020 UDS: 04/25/20 Last OV: 07/22/20 Next OV:  N/A Last Refill: 07/29/20, #120--0 RF Database:   Please advise

## 2020-08-26 NOTE — Telephone Encounter (Signed)
Pt requesting refill on  oxycodone 10MG .  Pharmacy is [place].

## 2020-09-08 ENCOUNTER — Ambulatory Visit (INDEPENDENT_AMBULATORY_CARE_PROVIDER_SITE_OTHER): Payer: HMO | Admitting: Pharmacist

## 2020-09-08 ENCOUNTER — Telehealth: Payer: Self-pay | Admitting: Family Medicine

## 2020-09-08 ENCOUNTER — Other Ambulatory Visit: Payer: Self-pay

## 2020-09-08 ENCOUNTER — Other Ambulatory Visit: Payer: Self-pay | Admitting: Family Medicine

## 2020-09-08 DIAGNOSIS — G4700 Insomnia, unspecified: Secondary | ICD-10-CM

## 2020-09-08 DIAGNOSIS — J439 Emphysema, unspecified: Secondary | ICD-10-CM | POA: Diagnosis not present

## 2020-09-08 DIAGNOSIS — I480 Paroxysmal atrial fibrillation: Secondary | ICD-10-CM | POA: Diagnosis not present

## 2020-09-08 MED ORDER — ELIQUIS 5 MG PO TABS: 1.0000 | ORAL_TABLET | Freq: Two times a day (BID) | ORAL | 0 refills | Status: AC

## 2020-09-08 NOTE — Telephone Encounter (Signed)
Rx sent 

## 2020-09-08 NOTE — Telephone Encounter (Signed)
Requesting: Ambien 5mg  Contract: 04/25/2020 UDS: 04/25/2020 Last Visit: 07/22/2020 Next Visit: None Last Refill: 08/16/2020 #30 and 0RF  Please Advise

## 2020-09-08 NOTE — Chronic Care Management (AMB) (Signed)
Chronic Care Management Pharmacy Note  09/08/2020 Name:  [name] MRN:  [id number] DOB:  Feb 11, 1946  Subjective: [name] is an 75 y.o. year old female who is a primary patient of Ann Held, DO.  The CCM team was consulted for assistance with disease management and care coordination needs.    Engaged with patient and her husband by telephone for  follow up patient assistance applications and follow up chronic conditions  in response to provider referral for pharmacy case management and/or care coordination services.   Consent to Services:  The patient was given information about Chronic Care Management services, agreed to services, and gave verbal consent prior to initiation of services.  Please see initial visit note for detailed documentation.   Patient Care Team: Carollee Herter, Alferd Apa, DO as PCP - General Minus Breeding, MD as PCP - Cardiology (Cardiology) Deboraha Sprang, MD as PCP - Electrophysiology (Cardiology) Rolm Bookbinder, MD as Consulting Physician (General Surgery) Lafayette Dragon, MD (Inactive) as Consulting Physician (Gastroenterology) Gatha Mayer, MD as Consulting Physician (Gastroenterology) Rutherford Guys, MD as Consulting Physician (Ophthalmology) Crista Luria, MD as Consulting Physician (Dermatology) Deboraha Sprang, MD as Consulting Physician (Cardiology) Minus Breeding, MD as Consulting Physician (Cardiology) Cherre Robins, PharmD (Pharmacist)  Recent office visits: 07/22/2020 - PCP (Dr Etter Sjogren) - video visit to f/u fibromyalgia. No medication changes  Recent consult visits: 08/24/2020 - ID (Dr Baxter Flattery)  F/U MAC infections. Patient noted weight loss and increased croupy cough. Noted bronchiectasis flare. Prescribed Amoxicillin 565m 3 times a day for 7 days; Stopped MAC antibiotics to give a break to see if can increase her weight; Recommended Ensure or protein drink to increase weight.  08/17/2020 - Hematology. Patient was given  retacrit today per 5/4 lab work HGB at 10.9 07/20/2020 - Hematology ([facility] NP) f/u iron deficiency anemia and erythropoietin deficient anemia. She received ESA today for Hgb 10.9.  Iron studies are pending. We will replace if needed.  Lab and injection every 4 weeks with follow-up in 8 weeks.    Hospital visits: None in previous 6 months  Objective:  Lab Results  Component Value Date   CREATININE 0.79 08/17/2020   CREATININE 0.92 07/20/2020   CREATININE 0.85 06/06/2020    Lab Results  Component Value Date   HGBA1C 5.2 05/31/2017   Last diabetic Eye exam: No results found for: HMDIABEYEEXA  Last diabetic Foot exam: No results found for: HMDIABFOOTEX      Component Value Date/Time   CHOL 102 05/11/2019 1045   TRIG 97 05/11/2019 1045   TRIG 76 03/11/2006 1059   HDL 29 (L) 05/11/2019 1045   CHOLHDL 3.5 05/11/2019 1045   VLDL 19 05/11/2019 1045   LDLCALC 54 05/11/2019 1045    Hepatic Function Latest Ref Rng & Units 08/17/2020 07/20/2020 06/06/2020  Total Protein 6.5 - 8.1 g/dL 7.3 7.3 6.9  Albumin 3.5 - 5.0 g/dL 4.4 4.1 4.2  AST 15 - 41 U/L 21 20 54(H)  ALT 0 - 44 U/L 13 12 52(H)  Alk Phosphatase 38 - 126 U/L 106 146(H) 202(H)  Total Bilirubin 0.3 - 1.2 mg/dL 0.3 0.3 0.4  Bilirubin, Direct 0.0 - 0.3 mg/dL - - -    Lab Results  Component Value Date/Time   TSH 7.708 (H) 07/20/2019 08:05 PM   TSH 4.43 06/26/2010 01:55 PM   TSH 1.05 02/15/2009 10:23 AM   FREET4 0.68 07/20/2019 08:30 PM    CBC Latest Ref Rng &  Units 08/17/2020 07/20/2020 06/06/2020  WBC 4.0 - 10.5 K/uL 7.7 6.8 4.5  Hemoglobin 12.0 - 15.0 g/dL 12.5 10.9(L) 10.4(L)  Hematocrit 36.0 - 46.0 % 39.3 33.9(L) 32.3(L)  Platelets 150 - 400 K/uL 265 330 174    Lab Results  Component Value Date/Time   VD25OH 16.46 (L) 09/06/2015 02:16 PM   VD25OH 4.8 (L) 05/20/2014 03:33 PM   VD25OH 15 (L) 12/12/2011 06:45 PM    Clinical ASCVD: Yes  The ASCVD Risk score Mikey Bussing DC Jr., et al., 2013) failed to calculate for the  following reasons:   The valid total cholesterol range is 130 to 320 mg/dL    Social History   Tobacco Use  Smoking Status Former   Packs/day: 1.00   Years: 20.00   Pack years: 20.00   Types: Cigarettes   Quit date: 03/19/1986   Years since quitting: 34.4  Smokeless Tobacco Never   BP Readings from Last 3 Encounters:  08/24/20 132/77  08/17/20 (!) 179/76  07/20/20 130/74   Pulse Readings from Last 3 Encounters:  08/24/20 86  08/17/20 80  07/20/20 85   Wt Readings from Last 3 Encounters:  08/24/20 85 lb 12.8 oz (38.9 kg)  07/05/20 87 lb 3.2 oz (39.6 kg)  06/23/20 88 lb (39.9 kg)    Assessment: Review of patient past medical history, allergies, medications, health status, including review of consultants reports, laboratory and other test data, was performed as part of comprehensive evaluation and provision of chronic care management services.   SDOH:  (Social Determinants of Health) assessments and interventions performed:     CCM Care Plan  Allergies  Allergen Reactions   Dabigatran Etexilate Mesylate Other (See Comments)    INTERNAL BLEEDING- Pradaxa   Augmentin [Amoxicillin-Pot Clavulanate] Diarrhea and Nausea Only    Did it involve swelling of the face/tongue/throat, SOB, or low BP? No Did it involve sudden or severe rash/hives, skin peeling, or any reaction on the inside of your mouth or nose? No Did you need to seek medical attention at a hospital or doctor's office? No When did it last happen? 02/2019 If all above answers are "NO", may proceed with cephalosporin use.    Talwin [Pentazocine] Other (See Comments)    Hallucinations     Medications Reviewed Today     Reviewed by Cherre Robins, PharmD (Pharmacist) on 09/08/20 at 1214  Med List Status: <None>   Medication Order Taking? Sig Documenting Provider Last Dose Status Informant  acetaminophen (TYLENOL) 650 MG CR tablet [id number] Yes Take 650 mg by mouth every 8 (eight) hours as needed for pain.  [provider] Taking Active Spouse/Significant Other  albuterol (VENTOLIN HFA) 108 (90 Base) MCG/ACT inhaler [id number] Yes Inhale 2 puffs into the lungs every 6 (six) hours as needed for wheezing or shortness of breath. [provider] Taking Active Spouse/Significant Other           Med Note Antony Contras, Brooklee Michelin B   Mon Jun 20, 2020 11:19 AM) No longer getting from PAP - now available generically  amLODipine (NORVASC) 2.5 MG tablet [id number] Yes TAKE 1 TABLET BY MOUTH AT BEDTIME Minus Breeding, MD Taking Active   apixaban (ELIQUIS) 5 MG TABS tablet [id number] Yes Take 1 tablet (5 mg total) by mouth 2 (two) times daily. Roma Schanz R, DO Taking Active   atorvastatin (LIPITOR) 20 MG tablet [id number] Yes TAKE 1 TABLET BY MOUTH ONCE DAILY AT  6PM  Patient taking differently: TAKE 1 TABLET BY MOUTH ONCE DAILY AT  6PM   Roma Schanz R, DO Taking Active   fluconazole (DIFLUCAN) 150 MG tablet [id number] Yes Take 1 tablet by mouth daily. [provider] Taking Active   furosemide (LASIX) 40 MG tablet [id number] Yes TAKE 1 TABLET BY MOUTH IN THE [name], MD Taking Active   gabapentin (NEURONTIN) 600 MG tablet [id number] Yes Take 1 tablet (600 mg total) by mouth in the morning, at noon, in the evening, and at bedtime. Ann Held, DO Taking Active     Discontinued 06/05/11 1433 (Error) lidocaine-prilocaine (EMLA) cream [id number] Yes Apply 1 application topically as needed.  Patient taking differently: Apply 1 application topically as needed (for port access- to numb).   Volanda Napoleon, MD Taking Active   nitroGLYCERIN (NITROSTAT) 0.4 MG SL tablet [id number] Yes Place 1 tablet (0.4 mg total) under the tongue every 5 (five) minutes as needed. CHEST PAIN Minus Breeding, MD Taking Active Spouse/Significant Other  oxyCODONE-acetaminophen (PERCOCET) 10-325 MG tablet [id number] Yes Take 1 tablet by mouth every 4 (four) hours as needed. PAIN Ann Held, DO Taking Active   OXYGEN [id number] Yes Inhale 3 L/min into the lungs continuous.  [provider] Taking Active Spouse/Significant Other  pantoprazole (PROTONIX) 40 MG tablet [id number] Yes Take 1 tablet by mouth twice daily Gatha Mayer, MD Taking Active   tiZANidine (ZANAFLEX) 4 MG tablet [id number] Yes Take 1 tablet by mouth three times daily as needed for muscle spasm Carollee Herter, Alferd Apa, DO Taking Active   triamcinolone ointment (KENALOG) 0.1 % [id number] Yes Apply 1 application topically daily as needed (to affected areas- for psoriasis).  [provider] Taking Active Spouse/Significant Other  umeclidinium bromide (INCRUSE ELLIPTA) 62.5 MCG/INH AEPB [id number] Yes Inhale 1 puff into the lungs daily. Ann Held, DO Taking Active            Med Note Antony Contras, Aleenah Homen B   Tue Jul 26, 2020 11:11 AM) Approved to received [id number] patient assistance 07/13/2020 to 03/18/2021  zolpidem (AMBIEN) 5 MG tablet [id number] Yes TAKE 1 TABLET BY MOUTH EVERY DAY AT BEDTIME AS NEEDED Ann Held, DO Taking Active             Patient Active Problem List   Diagnosis Date Noted   Hematoma 08/07/2019   Other neutropenia ([place])    Post-procedural fever 07/20/2019   Acute febrile illness 07/16/2019   Pulmonary MAI (mycobacterium avium-intracellulare) infection ([place]) 06/24/2019   History of total right hip replacement 05/27/2019   NICM (nonischemic cardiomyopathy) ([place]) 05/19/2019   Educated about COVID-19 virus infection 09/07/2018   Erythropoietin deficiency anemia 06/19/2018   Swelling of calf 02/04/2018   Bad odor of urine 12/09/2017   Iron deficiency anemia due to chronic blood loss 08/14/2017   Iron malabsorption 08/14/2017   Hyperlipidemia LDL goal <100 07/08/2017   Elevated ALT measurement    Hypotension 06/20/2017   HLD (hyperlipidemia) 06/20/2017   CAD (coronary artery disease) 06/20/2017   Protein-calorie malnutrition, severe ([place])  06/20/2017   Generalized weakness    Weakness 05/30/2017   Focal neurological deficit 05/30/2017   Chronic diastolic CHF (congestive heart failure) ([place]) 05/30/2017   TIA (transient ischemic attack) 05/30/2017   AKI (acute kidney injury) ([place]) 05/30/2017   Abnormal LFTs 05/30/2017   Bronchiectasis ([place]) 04/22/2017   Right otitis media 07/17/2016   Cerumen impaction 07/17/2016   Osteoporosis 03/16/2016   Headache 11/20/2014   Skin infection 05/27/2014   Insomnia 05/27/2014  Abdominal wall pain 07/09/2013   Other dysphagia 02/02/2013   Platelets decreased ([place]) 01/29/2012   Vitamin D deficiency 12/13/2011   Hypokalemia 12/13/2011   Hypocalcemia 12/12/2011   Adhesions of intestine, very dense, with frozen abdomen 12/11/2011   SBO (small bowel obstruction), recurrent 12/11/2011   Protein-calorie malnutrition, moderate ([place]) 12/11/2011   Oxygen dependent    Hx of gastric ulcer    UTI (urinary tract infection) 10/15/2011   Allergic conjunctivitis 10/15/2011   Degenerative arthritis of hip 08/03/2011   B12 deficiency 07/02/2011   Bilateral leg weakness 01/16/2011   Anemia, chronic disease 09/14/2010   Recurrent pneumonia 08/11/2010   CRT- [employer] 06/26/2010   DYSURIA 05/19/2010   Chronic hypoxemic respiratory failure ([name]) 02/21/2010   DEHYDRATION 01/06/2010   GASTROENTERITIS 01/06/2010   Congestive dilated cardiomyopathy ([place]) 11/17/2009   LBBB 60/63/0160   SYSTOLIC HEART FAILURE, CHRONIC 11/17/2009   GERD (gastroesophageal reflux disease) 02/17/2009   NONSPECIFIC ABN FINDNG RAD&OTH EXAM BILARY TRCT 09/03/2008   COPD with emphysema ([place]) 06/28/2008   DIVERTICULOSIS, COLON 06/03/2008   GASTRITIS 06/02/2008   Atrial fibrillation/flutter 12/08/2007   GASTROPARESIS 08/07/2007   NAUSEA, CHRONIC 08/07/2007   Abdominal pain 08/07/2007   SYNCOPE 02/17/2007   RESTLESS LEG SYNDROME 08/01/2006   Chronic pain syndrome 08/01/2006   OSTEOARTHRITIS 07/31/2006   Degenerative disc  disease, lumbar 07/31/2006   Fibromyalgia 07/31/2006   CHRONIC FATIGUE SYNDROME 07/31/2006   URINARY INCONTINENCE 07/31/2006   ANEMIA-IRON DEFICIENCY 05/01/2006   Essential hypertension 05/01/2006    Immunization History  Administered Date(s) Administered   DTaP 11/03/1996, 01/04/1997, 05/03/1997, 10/19/1997, 08/05/2001   Fluad Quad(high Dose 65+) 11/06/2018, 12/31/2019   HPV Quadrivalent 09/05/2009, 09/13/2010, 09/26/2011   Hepatitis A, Ped/Adol-2 Dose 09/21/2004, 03/29/2005   Hepatitis B, ped/adol 04/30/1996, 11/03/1996, 10/19/1997   HiB (PRP-T) 11/03/1996, 01/04/1997, 05/03/1997, 10/19/1997   IPV 11/03/1996, 01/04/1997, 05/03/1997, 08/05/2001   Influenza Split 01/16/2011, 12/12/2011   Influenza Whole 01/15/2007, 12/08/2007, 12/28/2008, 12/17/2009   Influenza, High Dose Seasonal PF 11/28/2015, 12/07/2016, 11/15/2017   Influenza, Seasonal, Injecte, Preservative Fre 12/09/2012   Influenza,Quad,Nasal, Live 03/17/2007, 03/06/2009, 01/08/2010, 01/30/2011, 02/09/2012, 01/18/2013   Influenza,inj,Quad PF,6+ Mos 03/29/2005, 04/30/2005, 03/01/2006, 01/28/2008, 12/01/2013, 01/16/2014, 12/22/2014, 12/28/2014   MMR 05/03/1997, 08/05/2001   Meningococcal B, OMV 12/22/2014   Pneumococcal Conjugate-13 06/10/2014, 11/15/2014   Pneumococcal Polysaccharide-23 12/17/2009   Tdap 09/17/2007, 05/15/2014, 12/09/2018   Varicella 11/16/1997, 05/18/2008   Zoster, Live 12/31/2013    Conditions to be addressed/monitored: CHF, HTN, HLD, COPD, Pulmonary Disease and GERD, insomnia; chronic pain; CAD  Care Plan : General Pharmacy (Adult)  Updates made by Cherre Robins, PHARMD since 09/08/2020 12:00 AM     Problem: Afib, COPD, HF, HTN, HLD, CAD, Chronic Pain Syndrome, GERD, Insomnia   Priority: High  Onset Date: 05/10/2020     Long-Range Goal: Patient-Specific Goal   Start Date: 05/10/2020  Expected End Date: 09/23/2020  Recent Progress: On track  Priority: High  Note:   Current Barriers:  Unable to  independently afford treatment regimen Chronic Disease Management support, education, and care coordination needs related to Afib, COPD, HF, HTN, HLD, CAD, Chronic Pain Syndrome, GERD, Insomnia  Pharmacist Clinical Goal(s):  Over the next 90 days, patient will verbalize ability to afford treatment regimen achieve adherence to monitoring guidelines and medication adherence to achieve therapeutic efficacy contact provider office for questions/concerns as evidenced notation of same in electronic health record through collaboration with PharmD and provider.   Interventions: 1:1 collaboration with Carollee Herter, Alferd Apa, DO regarding development and update  of comprehensive plan of care as evidenced by provider attestation and co-signature Inter-disciplinary care team collaboration (see longitudinal plan of care) Comprehensive medication review performed; medication list updated in electronic medical record  Hypertension (BP goal <140/90) Controlled Current treatment: Amlodipine 2.274m daily in the evening Medications previously tried: none noted  Current home readings: checks infrequently, noted BP usually < 140/80 Pharmacist interventions:  Educated on BP goals and benefits of medications for prevention of heart attack, stroke and kidney damage; Reminded about the importance of home blood pressure monitoring; Recommended to continue current medication.    Atrial Fibrillation (Goal: prevent stroke and major bleeding) Controlled  Current treatment: Anticoagulation: Eliquis 561mtwice daily (per Dr. LoEtter SjogrenMedications previously tried: amiodarone, carvedilol Patient is in Medicare coverage gap but has to spend $675 (3% of household income) on medications per manufacturer assistance program specifications) Intervention:  Discussed amount that manufacturer assistance program estimated patient will needed to spend to qualify to received Eliquis through their program with her caregiver (husband). He  will check insurance reports to see if she had met this yet and send reports to me once she had.  Maintain Eliquis therapy Patient has met 3% OOP spend require for [employer] patient assistance program for Eliquis. Faxed application and most recent medication cost report for 2022 to [employer] patient assistance.   COPD with past MAI infection (Goal: control symptoms and prevent exacerbations) Controlled Completed antibiotics for MAI infection in April 2022.  Current treatment  Incruse ellipta 62.74m374mnh once daily (per Sood) Proventil (per [place]ications previously tried: none noted  Gold Grade: Gold 2 (FEV1 50-79%) Current COPD Classification:  B (high sx, <2 exacerbations/yr) Pulmonary function testing: 10/10/2011-pulmonary function test-FVC 1.47 (54% predicted), postbronchodilator ratio 64, postbronchodilator FEV1 1.15 (59% predicted), positive bronchodilator response, DLCO 5.3 (35% predicted) Exacerbations requiring treatment in last 6 months: none Patient reports consistent use of maintenance inhaler Frequency of rescue inhaler use: as needed; usually 1 or 2 times per week Pharmacist Interventions:  Recommended continue current inhalers for COPD and antibiotics / antiinfectives for MAI per ID physicians.  PAP application for Incruse has been approved. Patient received first shipment in May 2022.    Patient is taking nitrofurantoin daily for UTI prevention. Consider risk versus benefits in patient with COPD given that nitrofurantoin is associated with pulmonary injury.   Pain (Goal: Minimize symptoms) Controlled Current treatment  Gabapentin 600m674mtimes daily Oxycodone 10-3274mg58mimes daily Tizanidine 4mg T374m(only takes BID) Acetaminophen 3274mg -21me 2 tabs PRN - uses every other day Medications previously tried: none noted Pharmacist interventions:  Recommended to continue current medication  Osteoporosis  Goal: reduce risk of fracture due to osteoporosis  Current regimen:   None Interventions: Reviewed benefits of Prolia, but will defer until patient has completed MAI treatment and DEXA rechecked   Health Maintenance  Interventions: Discussed consideration of completing Shingrix vaccine series (would wait until completion of MAI abx treatment) Discussed consideration of completing COVID Vaccine Series Patient self care activities - Over the next year, patient will: Consider completion of Shingrix vaccine series (after completion of MAI treatment) Consider completion of COVID Vaccine series  Patient Goals/Self-Care Activities Over the next 90 days, patient will:  take medications as prescribed check blood pressure periodically, document, and provide at future appointments collaborate with provider on medication access solutions  Follow Up Plan: The care management team will reach out to the patient again over the next 90 days.        Medication Assistance: Approved  for assistance for Incruse thru [place] assistance program- received first delivery 15/8063;  Application for Eliquis resent by fax today with most recent out of pocket spend for 2022 medications. Awaiting approval.   Patient's preferred pharmacy is:  [facility], [address] [place] [address] Phone: [phone] Fax: [phone]  Follow Up:  Patient agrees to Care Plan and Follow-up.  Plan: Telephone follow up appointment with care management team member scheduled for:  2 to 3 months  Cherre Robins, PharmD Clinical Pharmacist [facility] [phone]

## 2020-09-08 NOTE — Telephone Encounter (Signed)
Medication: apixaban (ELIQUIS) 5 MG TABS tablet [[id number]]     Has the patient contacted their pharmacy? no (If no, request that the patient contact the pharmacy for the refill.) (If yes, when and what did the pharmacy advise?)    Preferred Pharmacy (with phone number or street name):  [facility], [address] [place] Phone:  [phone]  Fax:  [phone]        Agent: Please be advised that RX refills may take up to 3 business days. We ask that you follow-up with your pharmacy.

## 2020-09-08 NOTE — Patient Instructions (Signed)
Visit Information  PATIENT GOALS:  Goals Addressed             This Visit's Progress    Chronic Care Management Pharmacy Care Plan   On track    CARE PLAN ENTRY (see longitudinal plan of care for additional care plan information)  Current Barriers:  Chronic Disease Management support, education, and care coordination needs related to Afib, COPD, HF, Hypertension, elevated cholesterol, Heart disease, Chronic Pain Syndrome, GERD, Insomnia   Hypertension BP Readings from Last 3 Encounters:  08/24/20 132/77  08/17/20 (!) 179/76  07/20/20 130/74  Pharmacist Clinical Goal(s): Over the next 90 days, patient will work with PharmD and providers to achieve BP goal <140/90 Current regimen:  Amlodipine 2.5mg  daily each evening Intervention:  Discussed blood pressure goals Patient self care activities - Over the next 90 days, patient will: Maintain hypertension medication regimen.  Check blood pressure at home 2 to 3 times per week, record and bring to office appointments  Atrial Fibrillation:  Pharmacist Clinical Goal(s): Over the next 90 days, patient will work with PharmD and providers to improve access to Eliquis (patient is in Medicare coverage gap but has to spend $675 (3% of household income) on medications per manufacturer assistance program specifications) Current regimen:  Eliquis 5mg  twice a day Intervention:  Faxed updated out of pocket expense to patient assistance program for Eliquis.  Patient self care activities - Over the next 90 days, patient will: Maintain Eliquis therapy Notify me once you received shipment of Eliquis   Hyperlipidemia:   Lab Results  Component Value Date/Time   LDLCALC 54 05/11/2019 10:45 AM  Pharmacist Clinical Goal(s): Over the next 90 days, patient will work with PharmD and providers to maintain LDL goal < 70 Current regimen:  Atorvastatin 20mg  daily Patient self care activities - Over the next 90 days, patient will: Maintain  cholesterol medication regimen.   COPD Pharmacist Clinical Goal(s) Over the next 90 days, patient will work with PharmD and providers to reduce symptoms associated with COPD and reduce barrier to receiving medication Current regimen:  Incruse ellipta 62.5mg /inh once daily  Proventil as needed Patient self care activities - Over the next 90 days, patient will: Continue current therapy Notify clinical pharmacist once you have received delivery of Incruse.  Osteoporosis  Pharmacist Clinical Goal(s) Over the next 180 days, patient will work with PharmD and providers to reduce risk of fracture due to osteoporosis  Current regimen:  None Interventions: Reviewed benefits of Prolia, but will defer until patient has completed MAI treatment and DEXA rechecked Patient self care activities - Over the next 180 days, patient will: Consider restarting Prolia pending next bone density results.   Health Maintenance  Pharmacist Clinical Goal(s) Over the next year, patient will work with PharmD and providers to complete health maintenance screenings/vaccinations Interventions: Discussed consideration of completing Shingrix vaccine series (would wait until completion of MAI abx treatment) Discussed consideration of completing COVID Vaccine Series Patient self care activities - Over the next year, patient will: Consider completion of Shingrix vaccine series (after completion of MAI treatment) Consider completion of COVID Vaccine series  Medication management Pharmacist Clinical Goal(s): Over the next 90 days, patient will work with PharmD and providers to maintain optimal medication adherence Current pharmacy: [facility]  Interventions Comprehensive medication review performed. Approved for PAP for Incruse; awaiting patient to meet out of pocket spend requirement for Eliquis.  Patient self care activities - Over the next 90 days, patient will: Focus on medication adherence by  filling and taking medications appropriately  Take medications as prescribed Report any questions or concerns to PharmD and/or provider(s) Bring in financial documentation for Eliquis patient assistance application  Please see past updates related to this goal by clicking on the "Past Updates" button in the selected goal          The patient verbalized understanding of instructions, educational materials, and care plan provided today and declined offer to receive copy of patient instructions, educational materials, and care plan.   Follow up with PharmD planned for 1 to 2 months.   Cherre Robins, PharmD Clinical Pharmacist [facility] [phone]

## 2020-09-16 ENCOUNTER — Ambulatory Visit (INDEPENDENT_AMBULATORY_CARE_PROVIDER_SITE_OTHER): Payer: HMO

## 2020-09-16 DIAGNOSIS — I428 Other cardiomyopathies: Secondary | ICD-10-CM

## 2020-09-17 ENCOUNTER — Other Ambulatory Visit: Payer: Self-pay | Admitting: Family Medicine

## 2020-09-17 DIAGNOSIS — R52 Pain, unspecified: Secondary | ICD-10-CM

## 2020-09-19 LAB — CUP PACEART REMOTE DEVICE CHECK
Battery Remaining Longevity: 102 mo
Battery Voltage: 3.01 V
Brady Statistic AP VP Percent: 0.69 %
Brady Statistic AP VS Percent: 0.03 %
Brady Statistic AS VP Percent: 98.97 %
Brady Statistic AS VS Percent: 0.32 %
Brady Statistic RA Percent Paced: 0.74 %
Brady Statistic RV Percent Paced: 99.65 %
Date Time Interrogation Session: 20220701035021
Implantable Lead Implant Date: 20111005
Implantable Lead Implant Date: 20111005
Implantable Lead Implant Date: 20111005
Implantable Lead Location: 753858
Implantable Lead Location: 753859
Implantable Lead Location: 753860
Implantable Lead Model: 4196
Implantable Lead Model: 5076
Implantable Lead Model: 5076
Implantable Pulse Generator Implant Date: 20210402
Lead Channel Impedance Value: 285 Ohm
Lead Channel Impedance Value: 323 Ohm
Lead Channel Impedance Value: 342 Ohm
Lead Channel Impedance Value: 342 Ohm
Lead Channel Impedance Value: 380 Ohm
Lead Channel Impedance Value: 399 Ohm
Lead Channel Impedance Value: 418 Ohm
Lead Channel Impedance Value: 456 Ohm
Lead Channel Impedance Value: 627 Ohm
Lead Channel Pacing Threshold Amplitude: 0.375 V
Lead Channel Pacing Threshold Amplitude: 0.625 V
Lead Channel Pacing Threshold Pulse Width: 0.4 ms
Lead Channel Pacing Threshold Pulse Width: 0.4 ms
Lead Channel Sensing Intrinsic Amplitude: 0.875 mV
Lead Channel Sensing Intrinsic Amplitude: 0.875 mV
Lead Channel Sensing Intrinsic Amplitude: 19.125 mV
Lead Channel Sensing Intrinsic Amplitude: 19.125 mV
Lead Channel Setting Pacing Amplitude: 1.25 V
Lead Channel Setting Pacing Amplitude: 2 V
Lead Channel Setting Pacing Amplitude: 2.5 V
Lead Channel Setting Pacing Pulse Width: 0.4 ms
Lead Channel Setting Pacing Pulse Width: 0.8 ms
Lead Channel Setting Sensing Sensitivity: 0.9 mV

## 2020-09-20 ENCOUNTER — Inpatient Hospital Stay: Payer: HMO

## 2020-09-20 ENCOUNTER — Inpatient Hospital Stay: Payer: HMO | Admitting: Hematology & Oncology

## 2020-09-20 ENCOUNTER — Encounter: Payer: Self-pay | Admitting: Hematology & Oncology

## 2020-09-20 ENCOUNTER — Inpatient Hospital Stay: Payer: HMO | Admitting: Family

## 2020-09-20 ENCOUNTER — Inpatient Hospital Stay: Payer: HMO | Attending: Family

## 2020-09-20 ENCOUNTER — Other Ambulatory Visit: Payer: Self-pay

## 2020-09-20 VITALS — BP 150/65 | HR 74 | Temp 98.0°F | Resp 18 | Wt 83.0 lb

## 2020-09-20 DIAGNOSIS — D631 Anemia in chronic kidney disease: Secondary | ICD-10-CM

## 2020-09-20 DIAGNOSIS — K909 Intestinal malabsorption, unspecified: Secondary | ICD-10-CM | POA: Diagnosis not present

## 2020-09-20 DIAGNOSIS — N189 Chronic kidney disease, unspecified: Secondary | ICD-10-CM | POA: Insufficient documentation

## 2020-09-20 DIAGNOSIS — Z95828 Presence of other vascular implants and grafts: Secondary | ICD-10-CM

## 2020-09-20 DIAGNOSIS — D508 Other iron deficiency anemias: Secondary | ICD-10-CM | POA: Diagnosis not present

## 2020-09-20 DIAGNOSIS — Z9981 Dependence on supplemental oxygen: Secondary | ICD-10-CM | POA: Diagnosis not present

## 2020-09-20 DIAGNOSIS — D500 Iron deficiency anemia secondary to blood loss (chronic): Secondary | ICD-10-CM

## 2020-09-20 LAB — CBC WITH DIFFERENTIAL (CANCER CENTER ONLY)
Abs Immature Granulocytes: 0.02 10*3/uL (ref 0.00–0.07)
Basophils Absolute: 0 10*3/uL (ref 0.0–0.1)
Basophils Relative: 1 %
Eosinophils Absolute: 0.2 10*3/uL (ref 0.0–0.5)
Eosinophils Relative: 4 %
HCT: 35.3 % — ABNORMAL LOW (ref 36.0–46.0)
Hemoglobin: 11.3 g/dL — ABNORMAL LOW (ref 12.0–15.0)
Immature Granulocytes: 0 %
Lymphocytes Relative: 14 %
Lymphs Abs: 0.7 10*3/uL (ref 0.7–4.0)
MCH: 29 pg (ref 26.0–34.0)
MCHC: 32 g/dL (ref 30.0–36.0)
MCV: 90.7 fL (ref 80.0–100.0)
Monocytes Absolute: 0.5 10*3/uL (ref 0.1–1.0)
Monocytes Relative: 10 %
Neutro Abs: 3.3 10*3/uL (ref 1.7–7.7)
Neutrophils Relative %: 71 %
Platelet Count: 205 10*3/uL (ref 150–400)
RBC: 3.89 MIL/uL (ref 3.87–5.11)
RDW: 14.5 % (ref 11.5–15.5)
WBC Count: 4.7 10*3/uL (ref 4.0–10.5)
nRBC: 0 % (ref 0.0–0.2)

## 2020-09-20 LAB — CMP (CANCER CENTER ONLY)
ALT: 31 U/L (ref 0–44)
AST: 30 U/L (ref 15–41)
Albumin: 4.3 g/dL (ref 3.5–5.0)
Alkaline Phosphatase: 186 U/L — ABNORMAL HIGH (ref 38–126)
Anion gap: 9 (ref 5–15)
BUN: 15 mg/dL (ref 8–23)
CO2: 30 mmol/L (ref 22–32)
Calcium: 8.9 mg/dL (ref 8.9–10.3)
Chloride: 99 mmol/L (ref 98–111)
Creatinine: 0.84 mg/dL (ref 0.44–1.00)
GFR, Estimated: >60 mL/min (ref >60)
Glucose, Bld: 99 mg/dL (ref 70–99)
Potassium: 4.2 mmol/L (ref 3.5–5.1)
Sodium: 138 mmol/L (ref 135–145)
Total Bilirubin: 0.4 mg/dL (ref 0.3–1.2)
Total Protein: 7.2 g/dL (ref 6.5–8.1)

## 2020-09-20 LAB — RETICULOCYTES
Immature Retic Fract: 4.3 % (ref 2.3–15.9)
RBC.: 3.93 MIL/uL (ref 3.87–5.11)
Retic Count, Absolute: 32.2 10*3/uL (ref 19.0–186.0)
Retic Ct Pct: 0.8 % (ref 0.4–3.1)

## 2020-09-20 MED ORDER — SODIUM CHLORIDE 0.9% FLUSH
10.0000 mL | INTRAVENOUS | Status: DC | PRN
  Administered 2020-09-20: 10 mL via INTRAVENOUS
  Filled 2020-09-20: qty 10

## 2020-09-20 MED ORDER — HEPARIN SOD (PORK) LOCK FLUSH 100 UNIT/ML IV SOLN
500.0000 [IU] | Freq: Once | INTRAVENOUS | Status: AC
  Administered 2020-09-20: 500 [IU] via INTRAVENOUS
  Filled 2020-09-20: qty 5

## 2020-09-20 NOTE — Progress Notes (Signed)
Hematology and Oncology Follow Up Visit  [name] [id number] [birth year] 75 y.o. 09/20/2020   Principle Diagnosis:  Iron deficiency anemia Erythropoietin deficient anemia    Current Therapy:        IV iron as indicated  Retacrit 40,000 units SQ for hgb < 11   Interim History:  [name] is here today for follow-up.  She feels pretty good.  She had a wonderful July 4 weekend.  She was home.  She is still wearing oxygen.  She has been on antibiotics for her lungs.  She is followed closely by Pulmonary Medicine.  When we last saw her in May, her ferritin was 528 with a iron saturation of 36%.  She has had no change in bowel or bladder habits.  She has had no bleeding.  There is been no fever.  Thankfully, she has been very cautious against COVID.  Currently, her performance status is ECOG 2.    Medications:  Allergies as of 09/20/2020       Reactions   Dabigatran Etexilate Mesylate Other (See Comments)   INTERNAL BLEEDING- Pradaxa   Augmentin [amoxicillin-pot Clavulanate] Diarrhea, Nausea Only   Did it involve swelling of the face/tongue/throat, SOB, or low BP? No Did it involve sudden or severe rash/hives, skin peeling, or any reaction on the inside of your mouth or nose? No Did you need to seek medical attention at a hospital or doctor's office? No When did it last happen? 02/2019 If all above answers are "NO", may proceed with cephalosporin use.   Talwin [pentazocine] Other (See Comments)   Hallucinations        Medication List        Accurate as of September 20, 2020 12:02 PM. If you have any questions, ask your nurse or doctor.          acetaminophen 650 MG CR tablet Commonly known as: TYLENOL Take 650 mg by mouth every 8 (eight) hours as needed for pain.   albuterol 108 (90 Base) MCG/ACT inhaler Commonly known as: VENTOLIN HFA Inhale 2 puffs into the lungs every 6 (six) hours as needed for wheezing or shortness of breath.   amLODipine 2.5 MG  tablet Commonly known as: NORVASC TAKE 1 TABLET BY MOUTH AT BEDTIME   atorvastatin 20 MG tablet Commonly known as: LIPITOR TAKE 1 TABLET BY MOUTH ONCE DAILY AT  6PM   Eliquis 5 MG Tabs tablet Generic drug: apixaban Take 1 tablet (5 mg total) by mouth 2 (two) times daily.   fluconazole 150 MG tablet Commonly known as: DIFLUCAN Take 1 tablet by mouth daily.   furosemide 40 MG tablet Commonly known as: LASIX TAKE 1 TABLET BY MOUTH IN THE MORNING   gabapentin 600 MG tablet Commonly known as: NEURONTIN Take 1 tablet (600 mg total) by mouth in the morning, at noon, in the evening, and at bedtime.   Incruse Ellipta 62.5 MCG/INH Aepb Generic drug: umeclidinium bromide Inhale 1 puff into the lungs daily.   lidocaine-prilocaine cream Commonly known as: EMLA Apply 1 application topically as needed. What changed: reasons to take this   nitroGLYCERIN 0.4 MG SL tablet Commonly known as: NITROSTAT Place 1 tablet (0.4 mg total) under the tongue every 5 (five) minutes as needed. CHEST PAIN   oxyCODONE-acetaminophen 10-325 MG tablet Commonly known as: PERCOCET Take 1 tablet by mouth every 4 (four) hours as needed. PAIN   OXYGEN Inhale 3 L/min into the lungs continuous.   pantoprazole 40 MG tablet Commonly known as:  PROTONIX Take 1 tablet by mouth twice daily   tiZANidine 4 MG tablet Commonly known as: ZANAFLEX Take 1 tablet by mouth three times daily as needed for muscle spasm   triamcinolone ointment 0.1 % Commonly known as: KENALOG Apply 1 application topically daily as needed (to affected areas- for psoriasis).   zolpidem 5 MG tablet Commonly known as: AMBIEN TAKE 1 TABLET BY MOUTH EVERY DAY AT BEDTIME AS NEEDED        Allergies:  Allergies  Allergen Reactions   Dabigatran Etexilate Mesylate Other (See Comments)    INTERNAL BLEEDING- Pradaxa   Augmentin [Amoxicillin-Pot Clavulanate] Diarrhea and Nausea Only    Did it involve swelling of the face/tongue/throat,  SOB, or low BP? No Did it involve sudden or severe rash/hives, skin peeling, or any reaction on the inside of your mouth or nose? No Did you need to seek medical attention at a hospital or doctor's office? No When did it last happen? 02/2019 If all above answers are "NO", may proceed with cephalosporin use.    Talwin [Pentazocine] Other (See Comments)    Hallucinations     Past Medical History, Surgical history, Social history, and Family History were reviewed and updated.  Review of Systems: All other 10 point review of systems is negative.   Physical Exam:  weight is 83 lb (37.6 kg). Her oral temperature is 98 F (36.7 C). Her blood pressure is 150/65 (abnormal) and her pulse is 74. Her respiration is 18 and oxygen saturation is 100%.   Wt Readings from Last 3 Encounters:  09/20/20 83 lb (37.6 kg)  08/24/20 85 lb 12.8 oz (38.9 kg)  07/05/20 87 lb 3.2 oz (39.6 kg)    Ocular: Sclerae unicteric, pupils equal, round and reactive to light Ear-nose-throat: Oropharynx clear, dentition fair Lymphatic: No cervical or supraclavicular adenopathy Lungs no rales or rhonchi, good excursion bilaterally Heart regular rate and rhythm, no murmur appreciated Abd soft, nontender, positive bowel sounds MSK no focal spinal tenderness, no joint edema Neuro: non-focal, well-oriented, appropriate affect Breasts: Deferred   Lab Results  Component Value Date   WBC 4.7 09/20/2020   HGB 11.3 (L) 09/20/2020   HCT 35.3 (L) 09/20/2020   MCV 90.7 09/20/2020   PLT 205 09/20/2020   Lab Results  Component Value Date   FERRITIN 528 (H) 07/20/2020   IRON 95 07/20/2020   TIBC 268 07/20/2020   UIBC 173 07/20/2020   IRONPCTSAT 36 (H) 07/20/2020   Lab Results  Component Value Date   RETICCTPCT 0.8 09/20/2020   RBC 3.89 09/20/2020   RBC 3.93 09/20/2020   RETICCTABS 64.6 04/01/2009   No results found for: KPAFRELGTCHN, LAMBDASER, KAPLAMBRATIO No results found for: IGGSERUM, IGA, IGMSERUM No  results found for: Odetta Pink, SPEI   Chemistry      Component Value Date/Time   NA 138 09/20/2020 1110   NA 135 06/16/2019 1333   K 4.2 09/20/2020 1110   CL 99 09/20/2020 1110   CO2 30 09/20/2020 1110   BUN 15 09/20/2020 1110   BUN 31 (H) 06/16/2019 1333   CREATININE 0.84 09/20/2020 1110   CREATININE 0.91 03/16/2016 1539      Component Value Date/Time   CALCIUM 8.9 09/20/2020 1110   ALKPHOS 186 (H) 09/20/2020 1110   AST 30 09/20/2020 1110   ALT 31 09/20/2020 1110   BILITOT 0.4 09/20/2020 1110       Impression and Plan: [name] is a very pleasant 75  yo caucasian female with iron deficiency anemia secondary to malabsorption and possible GI blood loss with Eliquis.   She does not need an injection today of Retacrit.  I would think that her iron studies should be okay.  We have her come back monthly for an evaluation with labs.  We will plan to see her back ourselves in 2 months.    Volanda Napoleon, MD 7/5/202212:02 PM

## 2020-09-21 LAB — IRON AND TIBC
Iron: 68 ug/dL (ref 41–142)
Saturation Ratios: 30 % (ref 21–57)
TIBC: 225 ug/dL — ABNORMAL LOW (ref 236–444)
UIBC: 157 ug/dL (ref 120–384)

## 2020-09-21 LAB — FERRITIN: Ferritin: 974 ng/mL — ABNORMAL HIGH (ref 11–307)

## 2020-09-22 ENCOUNTER — Telehealth: Payer: Self-pay | Admitting: *Deleted

## 2020-09-22 NOTE — Telephone Encounter (Signed)
Per 12/22 los - called patient and gave upcoming appointments  - patient confirmed

## 2020-09-23 ENCOUNTER — Other Ambulatory Visit: Payer: Self-pay | Admitting: Family Medicine

## 2020-09-23 DIAGNOSIS — R52 Pain, unspecified: Secondary | ICD-10-CM

## 2020-09-23 MED ORDER — OXYCODONE-ACETAMINOPHEN 10-325 MG PO TABS: 1.0000 | ORAL_TABLET | ORAL | 0 refills | Status: DC | PRN

## 2020-09-23 NOTE — Telephone Encounter (Signed)
Medication: oxyCODONE-acetaminophen (PERCOCET) 10-325 MG tablet  Has the patient contacted their pharmacy? No. (If no, request that the patient contact the pharmacy for the refill.) (If yes, when and what did the pharmacy advise?)  Preferred Pharmacy (with phone number or street name): [facility], [address] [place], [place] [address]  Phone:  [phone]  Fax:  [phone]   Agent: Please be advised that RX refills may take up to 3 business days. We ask that you follow-up with your pharmacy.

## 2020-09-23 NOTE — Telephone Encounter (Signed)
Requesting: Percocet Contract: 04/25/20 UDS: 04/25/20 Last OV: 07/22/20 Next OV: N/A Last Refill: 08/18/20, #[id number] RF Database:   Please advise

## 2020-09-27 ENCOUNTER — Ambulatory Visit (INDEPENDENT_AMBULATORY_CARE_PROVIDER_SITE_OTHER): Payer: HMO | Admitting: Pharmacist

## 2020-09-27 DIAGNOSIS — J439 Emphysema, unspecified: Secondary | ICD-10-CM

## 2020-09-27 DIAGNOSIS — I480 Paroxysmal atrial fibrillation: Secondary | ICD-10-CM

## 2020-09-27 NOTE — Chronic Care Management (AMB) (Signed)
Chronic Care Management Pharmacy Note  09/27/2020 Name:  [name] MRN:  [id number] DOB:  May 14, 1945  Subjective: [name] is an 75 y.o. year old female who is a primary patient of Ann Held, DO.  The CCM team was consulted for assistance with disease management and care coordination needs.    Coordinated with patient assistance program for Eliquis and engaged with patient and her husband by telephone for  follow up patient assistance applications and follow up chronic conditions  in response to provider referral for pharmacy case management and/or care coordination services.   Consent to Services:  The patient was given information about Chronic Care Management services, agreed to services, and gave verbal consent prior to initiation of services.  Please see initial visit note for detailed documentation.   Patient Care Team: Carollee Herter, Alferd Apa, DO as PCP - General Minus Breeding, MD as PCP - Cardiology (Cardiology) Deboraha Sprang, MD as PCP - Electrophysiology (Cardiology) Rolm Bookbinder, MD as Consulting Physician (General Surgery) Lafayette Dragon, MD (Inactive) as Consulting Physician (Gastroenterology) Gatha Mayer, MD as Consulting Physician (Gastroenterology) Rutherford Guys, MD as Consulting Physician (Ophthalmology) Crista Luria, MD as Consulting Physician (Dermatology) Deboraha Sprang, MD as Consulting Physician (Cardiology) Minus Breeding, MD as Consulting Physician (Cardiology) Cherre Robins, PharmD (Pharmacist)  Recent office visits: 07/22/2020 - PCP (Dr Etter Sjogren) - video visit to f/u fibromyalgia. No medication changes  Recent consult visits: 08/24/2020 - ID (Dr Baxter Flattery)  F/U MAC infections. Patient noted weight loss and increased croupy cough. Noted bronchiectasis flare. Prescribed Amoxicillin 527m 3 times a day for 7 days; Stopped MAC antibiotics to give a break to see if can increase her weight; Recommended Ensure or protein drink to  increase weight.  08/17/2020 - Hematology. Patient was given retacrit today per 5/4 lab work HGB at 10.9 07/20/2020 - Hematology ([facility] NP) f/u iron deficiency anemia and erythropoietin deficient anemia. She received ESA today for Hgb 10.9.  Iron studies are pending. We will replace if needed.  Lab and injection every 4 weeks with follow-up in 8 weeks.    Hospital visits: None in previous 6 months  Objective:  Lab Results  Component Value Date   CREATININE 0.84 09/20/2020   CREATININE 0.79 08/17/2020   CREATININE 0.92 07/20/2020    Lab Results  Component Value Date   HGBA1C 5.2 05/31/2017   Last diabetic Eye exam: No results found for: HMDIABEYEEXA  Last diabetic Foot exam: No results found for: HMDIABFOOTEX      Component Value Date/Time   CHOL 102 05/11/2019 1045   TRIG 97 05/11/2019 1045   TRIG 76 03/11/2006 1059   HDL 29 (L) 05/11/2019 1045   CHOLHDL 3.5 05/11/2019 1045   VLDL 19 05/11/2019 1045   LDLCALC 54 05/11/2019 1045    Hepatic Function Latest Ref Rng & Units 09/20/2020 08/17/2020 07/20/2020  Total Protein 6.5 - 8.1 g/dL 7.2 7.3 7.3  Albumin 3.5 - 5.0 g/dL 4.3 4.4 4.1  AST 15 - 41 U/L _0 ALT 0 - 44 U/L _1 Alk Phosphatase 38 - 126 U/L 186(H) 106 146(H)  Total Bilirubin 0.3 - 1.2 mg/dL 0.4 0.3 0.3  Bilirubin, Direct 0.0 - 0.3 mg/dL - - -    Lab Results  Component Value Date/Time   TSH 7.708 (H) 07/20/2019 08:05 PM   TSH 4.43 06/26/2010 01:55 PM   TSH 1.05 02/15/2009 10:23 AM   FREET4 0.68 07/20/2019 08:30 PM  CBC Latest Ref Rng & Units 09/20/2020 08/17/2020 07/20/2020  WBC 4.0 - 10.5 K/uL 4.7 7.7 6.8  Hemoglobin 12.0 - 15.0 g/dL 11.3(L) 12.5 10.9(L)  Hematocrit 36.0 - 46.0 % 35.3(L) 39.3 33.9(L)  Platelets 150 - 400 K/uL 205 265 330    Lab Results  Component Value Date/Time   VD25OH 16.46 (L) 09/06/2015 02:16 PM   VD25OH 4.8 (L) 05/20/2014 03:33 PM   VD25OH 15 (L) 12/12/2011 06:45 PM    Clinical ASCVD: Yes  The ASCVD Risk score  Mikey Bussing DC Jr., et al., 2013) failed to calculate for the following reasons:   The valid total cholesterol range is 130 to 320 mg/dL    Social History   Tobacco Use  Smoking Status Former   Packs/day: 1.00   Years: 20.00   Pack years: 20.00   Types: Cigarettes   Quit date: 03/19/1986   Years since quitting: 34.5  Smokeless Tobacco Never   BP Readings from Last 3 Encounters:  09/20/20 (!) 150/65  08/24/20 132/77  08/17/20 (!) 179/76   Pulse Readings from Last 3 Encounters:  09/20/20 74  08/24/20 86  08/17/20 80   Wt Readings from Last 3 Encounters:  09/20/20 83 lb (37.6 kg)  08/24/20 85 lb 12.8 oz (38.9 kg)  07/05/20 87 lb 3.2 oz (39.6 kg)    Assessment: Review of patient past medical history, allergies, medications, health status, including review of consultants reports, laboratory and other test data, was performed as part of comprehensive evaluation and provision of chronic care management services.   SDOH:  (Social Determinants of Health) assessments and interventions performed:     CCM Care Plan  Allergies  Allergen Reactions   Dabigatran Etexilate Mesylate Other (See Comments)    INTERNAL BLEEDING- Pradaxa   Augmentin [Amoxicillin-Pot Clavulanate] Diarrhea and Nausea Only    Did it involve swelling of the face/tongue/throat, SOB, or low BP? No Did it involve sudden or severe rash/hives, skin peeling, or any reaction on the inside of your mouth or nose? No Did you need to seek medical attention at a hospital or doctor's office? No When did it last happen? 02/2019 If all above answers are "NO", may proceed with cephalosporin use.    Talwin [Pentazocine] Other (See Comments)    Hallucinations     Medications Reviewed Today     Reviewed by Cherre Robins, PharmD (Pharmacist) on 09/27/20 at 1058  Med List Status: <None>   Medication Order Taking? Sig Documenting Provider Last Dose Status Informant  acetaminophen (TYLENOL) 650 MG CR tablet [id number] Yes Take 650  mg by mouth every 8 (eight) hours as needed for pain. [provider] Taking Active Spouse/Significant Other  albuterol (VENTOLIN HFA) 108 (90 Base) MCG/ACT inhaler [id number] Yes Inhale 2 puffs into the lungs every 6 (six) hours as needed for wheezing or shortness of breath. [provider] Taking Active Spouse/Significant Other           Med Note Antony Contras, Zhanae Proffit B   Mon Jun 20, 2020 11:19 AM) No longer getting from PAP - now available generically  amLODipine (NORVASC) 2.5 MG tablet [id number] Yes TAKE 1 TABLET BY MOUTH AT BEDTIME Minus Breeding, MD Taking Active   apixaban (ELIQUIS) 5 MG TABS tablet [id number] Yes Take 1 tablet (5 mg total) by mouth 2 (two) times daily. Ann Held, DO Taking Active            Med Note Antony Contras, Yesha Muchow B   Tue Sep 27, 2020 10:56 AM) Approved  to received thru [employer] PAP from 09/08/2020 thru 03/18/2021  atorvastatin (LIPITOR) 20 MG tablet [id number] Yes TAKE 1 TABLET BY MOUTH ONCE DAILY AT  6PM  Patient taking differently: TAKE 1 TABLET BY MOUTH ONCE DAILY AT  [address] DO Taking Active   fluconazole (DIFLUCAN) 150 MG tablet [id number] Yes Take 1 tablet by mouth daily. [provider] Taking Active   furosemide (LASIX) 40 MG tablet [id number] Yes TAKE 1 TABLET BY MOUTH IN THE [name], MD Taking Active   gabapentin (NEURONTIN) 600 MG tablet [id number] Yes Take 1 tablet (600 mg total) by mouth in the morning, at noon, in the evening, and at bedtime. Ann Held, DO Taking Active     Discontinued 06/05/11 1433 (Error) lidocaine-prilocaine (EMLA) cream [id number] Yes Apply 1 application topically as needed.  Patient taking differently: Apply 1 application topically as needed (for port access- to numb).   Volanda Napoleon, MD Taking Active   nitroGLYCERIN (NITROSTAT) 0.4 MG SL tablet [id number] Yes Place 1 tablet (0.4 mg total) under the tongue every 5 (five) minutes as needed. CHEST PAIN  Minus Breeding, MD Taking Active Spouse/Significant Other  oxyCODONE-acetaminophen (PERCOCET) 10-325 MG tablet [id number] Yes Take 1 tablet by mouth every 4 (four) hours as needed. PAIN Ann Held, DO Taking Active   OXYGEN [id number] Yes Inhale 3 L/min into the lungs continuous.  [provider] Taking Active Spouse/Significant Other  pantoprazole (PROTONIX) 40 MG tablet [id number] Yes Take 1 tablet by mouth twice daily Gatha Mayer, MD Taking Active   tiZANidine (ZANAFLEX) 4 MG tablet [id number] Yes Take 1 tablet by mouth three times daily as needed for muscle spasm Carollee Herter, Alferd Apa, DO Taking Active   triamcinolone ointment (KENALOG) 0.1 % [id number] Yes Apply 1 application topically daily as needed (to affected areas- for psoriasis).  [provider] Taking Active Spouse/Significant Other  umeclidinium bromide (INCRUSE ELLIPTA) 62.5 MCG/INH AEPB [id number] Yes Inhale 1 puff into the lungs daily. Ann Held, DO Taking Active            Med Note Antony Contras, Norell Brisbin B   Tue Jul 26, 2020 11:11 AM) Approved to received [employer] patient assistance 07/13/2020 to 03/18/2021  zolpidem (AMBIEN) 5 MG tablet [id number] Yes TAKE 1 TABLET BY MOUTH EVERY DAY AT BEDTIME AS NEEDED Ann Held, DO Taking Active             Patient Active Problem List   Diagnosis Date Noted   Hematoma 08/07/2019   Other neutropenia ([place])    Post-procedural fever 07/20/2019   Acute febrile illness 07/16/2019   Pulmonary MAI (mycobacterium avium-intracellulare) infection ([place]) 06/24/2019   History of total right hip replacement 05/27/2019   NICM (nonischemic cardiomyopathy) ([place]) 05/19/2019   Educated about COVID-19 virus infection 09/07/2018   Erythropoietin deficiency anemia 06/19/2018   Swelling of calf 02/04/2018   Bad odor of urine 12/09/2017   Iron deficiency anemia due to chronic blood loss 08/14/2017   Iron malabsorption 08/14/2017   Hyperlipidemia LDL goal  <100 07/08/2017   Elevated ALT measurement    Hypotension 06/20/2017   HLD (hyperlipidemia) 06/20/2017   CAD (coronary artery disease) 06/20/2017   Protein-calorie malnutrition, severe ([place]) 06/20/2017   Generalized weakness    Weakness 05/30/2017   Focal neurological deficit 05/30/2017   Chronic diastolic CHF (congestive heart failure) ([place]) 05/30/2017   TIA (transient ischemic attack) 05/30/2017   AKI (acute kidney injury) (  [place]) 05/30/2017   Abnormal LFTs 05/30/2017   Bronchiectasis ([place]) 04/22/2017   Right otitis media 07/17/2016   Cerumen impaction 07/17/2016   Osteoporosis 03/16/2016   Headache 11/20/2014   Skin infection 05/27/2014   Insomnia 05/27/2014   Abdominal wall pain 07/09/2013   Other dysphagia 02/02/2013   Platelets decreased ([place]) 01/29/2012   Vitamin D deficiency 12/13/2011   Hypokalemia 12/13/2011   Hypocalcemia 12/12/2011   Adhesions of intestine, very dense, with frozen abdomen 12/11/2011   SBO (small bowel obstruction), recurrent 12/11/2011   Protein-calorie malnutrition, moderate ([place]) 12/11/2011   Oxygen dependent    Hx of gastric ulcer    UTI (urinary tract infection) 10/15/2011   Allergic conjunctivitis 10/15/2011   Degenerative arthritis of hip 08/03/2011   B12 deficiency 07/02/2011   Bilateral leg weakness 01/16/2011   Anemia, chronic disease 09/14/2010   Recurrent pneumonia 08/11/2010   CRT- [employer] 06/26/2010   DYSURIA 05/19/2010   Chronic hypoxemic respiratory failure ([place]) 02/21/2010   DEHYDRATION 01/06/2010   GASTROENTERITIS 01/06/2010   Congestive dilated cardiomyopathy ([place]) 11/17/2009   LBBB 64/40/3474   SYSTOLIC HEART FAILURE, CHRONIC 11/17/2009   GERD (gastroesophageal reflux disease) 02/17/2009   NONSPECIFIC ABN FINDNG RAD&OTH EXAM BILARY TRCT 09/03/2008   COPD with emphysema ([place]) 06/28/2008   DIVERTICULOSIS, COLON 06/03/2008   GASTRITIS 06/02/2008   Atrial fibrillation/flutter 12/08/2007   GASTROPARESIS 08/07/2007    NAUSEA, CHRONIC 08/07/2007   Abdominal pain 08/07/2007   SYNCOPE 02/17/2007   RESTLESS LEG SYNDROME 08/01/2006   Chronic pain syndrome 08/01/2006   OSTEOARTHRITIS 07/31/2006   Degenerative disc disease, lumbar 07/31/2006   Fibromyalgia 07/31/2006   CHRONIC FATIGUE SYNDROME 07/31/2006   URINARY INCONTINENCE 07/31/2006   ANEMIA-IRON DEFICIENCY 05/01/2006   Essential hypertension 05/01/2006    Immunization History  Administered Date(s) Administered   DTaP 11/03/1996, 01/04/1997, 05/03/1997, 10/19/1997, 08/05/2001   Fluad Quad(high Dose 65+) 11/06/2018, 12/31/2019   HPV Quadrivalent 09/05/2009, 09/13/2010, 09/26/2011   Hepatitis A, Ped/Adol-2 Dose 09/21/2004, 03/29/2005   Hepatitis B, ped/adol 04/30/1996, 11/03/1996, 10/19/1997   HiB (PRP-T) 11/03/1996, 01/04/1997, 05/03/1997, 10/19/1997   IPV 11/03/1996, 01/04/1997, 05/03/1997, 08/05/2001   Influenza Split 01/16/2011, 12/12/2011   Influenza Whole 01/15/2007, 12/08/2007, 12/28/2008, 12/17/2009   Influenza, High Dose Seasonal PF 11/28/2015, 12/07/2016, 11/15/2017   Influenza, Seasonal, Injecte, Preservative Fre 12/09/2012   Influenza,Quad,Nasal, Live 03/17/2007, 03/06/2009, 01/08/2010, 01/30/2011, 02/09/2012, 01/18/2013   Influenza,inj,Quad PF,6+ Mos 03/29/2005, 04/30/2005, 03/01/2006, 01/28/2008, 12/01/2013, 01/16/2014, 12/22/2014, 12/28/2014   MMR 05/03/1997, 08/05/2001   Meningococcal B, OMV 12/22/2014   Pneumococcal Conjugate-13 06/10/2014, 11/15/2014   Pneumococcal Polysaccharide-23 12/17/2009   Tdap 09/17/2007, 05/15/2014, 12/09/2018   Varicella 11/16/1997, 05/18/2008   Zoster, Live 12/31/2013    Conditions to be addressed/monitored: CHF, HTN, HLD, COPD, Pulmonary Disease and GERD, insomnia; chronic pain; CAD  Care Plan : General Pharmacy (Adult)  Updates made by Cherre Robins, PHARMD since 09/27/2020 12:00 AM     Problem: Afib, COPD, HF, HTN, HLD, CAD, Chronic Pain Syndrome, GERD, Insomnia   Priority: High  Onset Date:  05/10/2020     Long-Range Goal: Patient-Specific Goal   Start Date: 05/10/2020  Expected End Date: 09/23/2020  Recent Progress: On track  Priority: High  Note:   Current Barriers:  Unable to independently afford treatment regimen Chronic Disease Management support, education, and care coordination needs related to Afib, COPD, HF, HTN, HLD, CAD, Chronic Pain Syndrome, GERD, Insomnia  Pharmacist Clinical Goal(s):  Over the next 90 days, patient will verbalize ability to afford treatment regimen achieve adherence to monitoring guidelines  and medication adherence to achieve therapeutic efficacy contact provider office for questions/concerns as evidenced notation of same in electronic health record through collaboration with PharmD and provider.   Interventions: 1:1 collaboration with Carollee Herter, Alferd Apa, DO regarding development and update of comprehensive plan of care as evidenced by provider attestation and co-signature Inter-disciplinary care team collaboration (see longitudinal plan of care) Comprehensive medication review performed; medication list updated in electronic medical record  Hypertension (BP goal <140/90) Controlled Current treatment: Amlodipine 2.86m daily in the evening Medications previously tried: none noted  Current home readings: checks infrequently, noted BP usually < 140/80 Pharmacist interventions:  Educated on BP goals and benefits of medications for prevention of heart attack, stroke and kidney damage; Reminded about the importance of home blood pressure monitoring; Recommended to continue current medication.    Atrial Fibrillation (Goal: prevent stroke and major bleeding) Controlled  Current treatment: Anticoagulation: Eliquis 539mtwice daily (per Dr. LoEtter SjogrenMedications previously tried: amiodarone, carvedilol Patient is in Medicare coverage gap but has to spend $675 (3% of household income) on medications per manufacturer assistance program  specifications) Intervention:  Discussed amount that manufacturer assistance program estimated patient will needed to spend to qualify to received Eliquis through their program (previous visit) Maintain Eliquis therapy Patient has met 3% OOP spend require for [employer] patient assistance program for Eliquis. Faxed application and most recent medication cost report for 2022 to [employer] patient assistance. (Previous visit)  Confirmed with [employer] patient assistance program that she has been approved form 09/08/2020 thru 03/18/2021;  Confirmed with patient she received first shipment 09/16/2020  COPD with past MAI infection (Goal: control symptoms and prevent exacerbations) Controlled Completed antibiotics for MAI infection in April 2022.  Current treatment  Incruse ellipta 62.59m359mnh once daily (per S[place]) Proventil (per SooDeseretedications previously tried: none noted  Gold Grade: Gold 2 (FEV1 50-79%) Current COPD Classification:  B (high sx, <2 exacerbations/yr) Pulmonary function testing: 10/10/2011-pulmonary function test-FVC 1.47 (54% predicted), postbronchodilator ratio 64, postbronchodilator FEV1 1.15 (59% predicted), positive bronchodilator response, DLCO 5.3 (35% predicted) Exacerbations requiring treatment in last 6 months: none Patient reports consistent use of maintenance inhaler Frequency of rescue inhaler use: as needed; usually 1 or 2 times per week Pharmacist Interventions:  Recommended continue current inhalers for COPD and antibiotics / antiinfectives for MAI per ID physicians.  PAP application for Incruse has been approved. Patient received first shipment in May 2022.    Patient is taking nitrofurantoin daily for UTI prevention. Consider risk versus benefits in patient with COPD given that nitrofurantoin is associated with pulmonary injury.   Pain (Goal: Minimize symptoms) Controlled Current treatment  Gabapentin 600m16mtimes daily Oxycodone 10-3259mg259mimes daily Tizanidine 4mg T30m(only  takes BID) Acetaminophen 3259mg -759me 2 tabs PRN - uses every other day Medications previously tried: none noted Pharmacist interventions:  Recommended to continue current medication  Osteoporosis  Goal: reduce risk of fracture due to osteoporosis  Current regimen:  None Interventions: Reviewed benefits of Prolia, but will defer until patient has completed MAI treatment and DEXA rechecked   Health Maintenance  Interventions: Discussed consideration of completing Shingrix vaccine series (would wait until completion of MAI abx treatment) Discussed consideration of completing COVID Vaccine Series Patient self care activities - Over the next year, patient will: Consider completion of Shingrix vaccine series (after completion of MAI treatment) Consider completion of COVID Vaccine series  Patient Goals/Self-Care Activities Over the next 90 days, patient will:  take medications as prescribed check blood pressure  periodically, document, and provide at future appointments collaborate with provider on medication access solutions  Follow Up Plan: The care management team will reach out to the patient again over the next 90 days.        Medication Assistance: Approved for assistance for Incruse thru [place] assistance program- received first delivery 07/2020;   Approved for assistance for Eliquis thru [employer] assistance program - received first delivery 09/16/2020.  Patient's preferred pharmacy is:  [facility], [place] [address] Phone: [phone] Fax: [phone]  Follow Up:  Patient agrees to Care Plan and Follow-up.  Plan: Telephone follow up appointment with care management team member scheduled for:  2 to 3 months  Cherre Robins, PharmD Clinical Pharmacist [place] [facility] [phone]

## 2020-09-27 NOTE — Patient Instructions (Signed)
Visit Information  PATIENT GOALS:  Goals Addressed             This Visit's Progress    Chronic Care Management Pharmacy Care Plan   On track    CARE PLAN ENTRY (see longitudinal plan of care for additional care plan information)  Current Barriers:  Chronic Disease Management support, education, and care coordination needs related to Afib, COPD, HF, Hypertension, elevated cholesterol, Heart disease, Chronic Pain Syndrome, GERD, Insomnia   Hypertension BP Readings from Last 3 Encounters:  09/20/20 (!) 150/65  08/24/20 132/77  08/17/20 (!) 179/76  Pharmacist Clinical Goal(s): Over the next 90 days, patient will work with PharmD and providers to achieve BP goal <140/90 Current regimen:  Amlodipine 2.5mg  daily each evening Intervention:  Discussed blood pressure goals Patient self care activities - Over the next 90 days, patient will: Maintain hypertension medication regimen.  Check blood pressure at home 2 to 3 times per week, record and bring to office appointments  Atrial Fibrillation:  Pharmacist Clinical Goal(s): Over the next 90 days, patient will work with PharmD and providers to improve access to Eliquis (patient is in Medicare coverage gap but has to spend $675 (3% of household income) on medications per manufacturer assistance program specifications) Current regimen:  Eliquis 5mg  twice a day Intervention:  Faxed updated out of pocket expense to patient assistance program for Eliquis. (Done 09/08/2020) Verified with patient assistance program that she was approved 09/08/2020; First shipment received 07/0/2022 Patient self care activities - Over the next 90 days, patient will: Maintain Eliquis therapy Call if any questions regarding reordering Eliquis from patient assistance program   Hyperlipidemia:   Lab Results  Component Value Date/Time   LDLCALC 54 05/11/2019 10:45 AM  Pharmacist Clinical Goal(s): Over the next 90 days, patient will work with PharmD and  providers to maintain LDL goal < 70 Current regimen:  Atorvastatin 20mg  daily Patient self care activities - Over the next 90 days, patient will: Maintain cholesterol medication regimen.   COPD Pharmacist Clinical Goal(s) Over the next 90 days, patient will work with PharmD and providers to reduce symptoms associated with COPD and reduce barrier to receiving medication Current regimen:  Incruse ellipta 62.5mg /inh once daily  Proventil as needed Patient self care activities - Over the next 90 days, patient will: Continue current therapy Call if any questions regarding reordering Eliquis from patient assistance program  Osteoporosis  Pharmacist Clinical Goal(s) Over the next 180 days, patient will work with PharmD and providers to reduce risk of fracture due to osteoporosis  Current regimen:  None Interventions: Reviewed benefits of Prolia, but will defer until patient has completed MAI treatment and DEXA rechecked Patient self care activities - Over the next 180 days, patient will: Consider restarting Prolia pending next bone density results.   Health Maintenance  Pharmacist Clinical Goal(s) Over the next year, patient will work with PharmD and providers to complete health maintenance screenings/vaccinations Interventions: Discussed consideration of completing Shingrix vaccine series (would wait until completion of MAI abx treatment) Discussed consideration of completing COVID Vaccine Series Patient self care activities - Over the next year, patient will: Consider completion of Shingrix vaccine series (after completion of MAI treatment) Consider completion of COVID Vaccine series  Medication management Pharmacist Clinical Goal(s): Over the next 90 days, patient will work with PharmD and providers to maintain optimal medication adherence Current pharmacy: [facility]  Interventions Comprehensive medication review performed. Approved for PAP for Incruse and  Eliquis.  Patient self care activities -  Over the next 90 days, patient will: Focus on medication adherence by filling and taking medications appropriately  Take medications as prescribed Report any questions or concerns to PharmD and/or provider(s) Bring in financial documentation for Eliquis patient assistance application  Please see past updates related to this goal by clicking on the "Past Updates" button in the selected goal          The patient verbalized understanding of instructions, educational materials, and care plan provided today and declined offer to receive copy of patient instructions, educational materials, and care plan.   Telephone follow up appointment with care management team member scheduled for:  2 to 3 months  Cherre Robins, PharmD Clinical Pharmacist [facility] [phone]

## 2020-09-28 ENCOUNTER — Ambulatory Visit: Payer: HMO | Admitting: Internal Medicine

## 2020-09-28 ENCOUNTER — Other Ambulatory Visit: Payer: Self-pay

## 2020-09-28 VITALS — Wt 85.0 lb

## 2020-09-28 DIAGNOSIS — R630 Anorexia: Secondary | ICD-10-CM

## 2020-09-28 DIAGNOSIS — J479 Bronchiectasis, uncomplicated: Secondary | ICD-10-CM

## 2020-09-28 DIAGNOSIS — A310 Pulmonary mycobacterial infection: Secondary | ICD-10-CM | POA: Diagnosis not present

## 2020-09-28 MED ORDER — NITROFURANTOIN MONOHYD MACRO 100 MG PO CAPS: 100.0000 mg | ORAL_CAPSULE | Freq: Every day | ORAL | Status: DC

## 2020-09-28 NOTE — Progress Notes (Signed)
RFV: follow up for pulmonary mac  Patient ID: [name], female   DOB: [birth year], 75 y.o.   MRN: [id number]  HPI 75yo F with pulmonary mac who had unintended weight loss, more side effects with medication thus I had asked her to stop taking her meds at our last visit. She has been off of treatment for +4 months. She does not feel like her cough is any worse. She reports only 2 lb weight gain since her last visit  Outpatient Encounter Medications as of 09/28/2020  Medication Sig   acetaminophen (TYLENOL) 650 MG CR tablet Take 650 mg by mouth every 8 (eight) hours as needed for pain.   albuterol (VENTOLIN HFA) 108 (90 Base) MCG/ACT inhaler Inhale 2 puffs into the lungs every 6 (six) hours as needed for wheezing or shortness of breath.   amLODipine (NORVASC) 2.5 MG tablet TAKE 1 TABLET BY MOUTH AT BEDTIME   apixaban (ELIQUIS) 5 MG TABS tablet Take 1 tablet (5 mg total) by mouth 2 (two) times daily.   atorvastatin (LIPITOR) 20 MG tablet TAKE 1 TABLET BY MOUTH ONCE DAILY AT  6PM (Patient taking differently: TAKE 1 TABLET BY MOUTH ONCE DAILY AT  6PM)   fluconazole (DIFLUCAN) 150 MG tablet Take 1 tablet by mouth daily.   furosemide (LASIX) 40 MG tablet TAKE 1 TABLET BY MOUTH IN THE MORNING   gabapentin (NEURONTIN) 600 MG tablet Take 1 tablet (600 mg total) by mouth in the morning, at noon, in the evening, and at bedtime.   lidocaine-prilocaine (EMLA) cream Apply 1 application topically as needed. (Patient taking differently: Apply 1 application topically as needed (for port access- to numb).)   nitroGLYCERIN (NITROSTAT) 0.4 MG SL tablet Place 1 tablet (0.4 mg total) under the tongue every 5 (five) minutes as needed. CHEST PAIN   oxyCODONE-acetaminophen (PERCOCET) 10-325 MG tablet Take 1 tablet by mouth every 4 (four) hours as needed. PAIN   OXYGEN Inhale 3 L/min into the lungs continuous.    pantoprazole (PROTONIX) 40 MG tablet Take 1 tablet by mouth twice daily   tiZANidine (ZANAFLEX) 4 MG  tablet Take 1 tablet by mouth three times daily as needed for muscle spasm   triamcinolone ointment (KENALOG) 0.1 % Apply 1 application topically daily as needed (to affected areas- for psoriasis).    umeclidinium bromide (INCRUSE ELLIPTA) 62.5 MCG/INH AEPB Inhale 1 puff into the lungs daily.   zolpidem (AMBIEN) 5 MG tablet TAKE 1 TABLET BY MOUTH EVERY DAY AT BEDTIME AS NEEDED   [DISCONTINUED] iron dextran complex (INFED) 50 MG/ML injection Please give infed infusion (no test dose needed) over 4 hours per pharmacy calculated dose. Ht: 5'2 Wt: 99 pounds Hgb: 12   No facility-administered encounter medications on file as of 09/28/2020.     Patient Active Problem List   Diagnosis Date Noted   Hematoma 08/07/2019   Other neutropenia ([place])    Post-procedural fever 07/20/2019   Acute febrile illness 07/16/2019   Pulmonary MAI (mycobacterium avium-intracellulare) infection ([place]) 06/24/2019   History of total right hip replacement 05/27/2019   NICM (nonischemic cardiomyopathy) ([place]) 05/19/2019   Educated about COVID-19 virus infection 09/07/2018   Erythropoietin deficiency anemia 06/19/2018   Swelling of calf 02/04/2018   Bad odor of urine 12/09/2017   Iron deficiency anemia due to chronic blood loss 08/14/2017   Iron malabsorption 08/14/2017   Hyperlipidemia LDL goal <100 07/08/2017   Elevated ALT measurement    Hypotension 06/20/2017   HLD (hyperlipidemia) 06/20/2017   CAD (  coronary artery disease) 06/20/2017   Protein-calorie malnutrition, severe ([place]) 06/20/2017   Generalized weakness    Weakness 05/30/2017   Focal neurological deficit 05/30/2017   Chronic diastolic CHF (congestive heart failure) ([place]) 05/30/2017   TIA (transient ischemic attack) 05/30/2017   AKI (acute kidney injury) ([place]) 05/30/2017   Abnormal LFTs 05/30/2017   Bronchiectasis ([place]) 04/22/2017   Right otitis media 07/17/2016   Cerumen impaction 07/17/2016   Osteoporosis 03/16/2016   Headache 11/20/2014   Skin  infection 05/27/2014   Insomnia 05/27/2014   Abdominal wall pain 07/09/2013   Other dysphagia 02/02/2013   Platelets decreased ([place]) 01/29/2012   Vitamin D deficiency 12/13/2011   Hypokalemia 12/13/2011   Hypocalcemia 12/12/2011   Adhesions of intestine, very dense, with frozen abdomen 12/11/2011   SBO (small bowel obstruction), recurrent 12/11/2011   Protein-calorie malnutrition, moderate ([place]) 12/11/2011   Oxygen dependent    Hx of gastric ulcer    UTI (urinary tract infection) 10/15/2011   Allergic conjunctivitis 10/15/2011   Degenerative arthritis of hip 08/03/2011   B12 deficiency 07/02/2011   Bilateral leg weakness 01/16/2011   Anemia, chronic disease 09/14/2010   Recurrent pneumonia 08/11/2010   CRT- [employer] 06/26/2010   DYSURIA 05/19/2010   Chronic hypoxemic respiratory failure ([place]) 02/21/2010   DEHYDRATION 01/06/2010   GASTROENTERITIS 01/06/2010   Congestive dilated cardiomyopathy ([place]) 11/17/2009   LBBB 85/27/7824   SYSTOLIC HEART FAILURE, CHRONIC 11/17/2009   GERD (gastroesophageal reflux disease) 02/17/2009   NONSPECIFIC ABN FINDNG RAD&OTH EXAM BILARY TRCT 09/03/2008   COPD with emphysema ([place]) 06/28/2008   DIVERTICULOSIS, COLON 06/03/2008   GASTRITIS 06/02/2008   Atrial fibrillation/flutter 12/08/2007   GASTROPARESIS 08/07/2007   NAUSEA, CHRONIC 08/07/2007   Abdominal pain 08/07/2007   SYNCOPE 02/17/2007   RESTLESS LEG SYNDROME 08/01/2006   Chronic pain syndrome 08/01/2006   OSTEOARTHRITIS 07/31/2006   Degenerative disc disease, lumbar 07/31/2006   Fibromyalgia 07/31/2006   CHRONIC FATIGUE SYNDROME 07/31/2006   URINARY INCONTINENCE 07/31/2006   ANEMIA-IRON DEFICIENCY 05/01/2006   Essential hypertension 05/01/2006     Health Maintenance Due  Topic Date Due   COVID-19 Vaccine (1) Never done   Zoster Vaccines- Shingrix (1 of 2) Never done   MAMMOGRAM  09/14/2015     Review of Systems +weight loss ,fatigue, DOE, decreased energy. 12 point ROS is  otherwise negative Physical Exam   Wt 85 lb (38.6 kg)   BMI 16.60 kg/m   Physical Exam  Constitutional:  oriented to person, place, and time. appears well-developed and well-nourished. No distress.  HENT: [place]/AT, PERRLA, no scleral icterus Mouth/Throat: Oropharynx is clear and moist. No oropharyngeal exudate.  Cardiovascular: Normal rate, regular rhythm and normal heart sounds. Exam reveals no gallop and no friction rub.  No murmur heard.  Pulmonary/Chest: Effort normal and breath sounds normal. No respiratory distress.  has no wheezes.  Neck = supple, no nuchal rigidity Lymphadenopathy: no cervical adenopathy. No axillary adenopathy Neurological: alert and oriented to person, place, and time.  Skin: Skin is warm and dry. No rash noted. No erythema.  Psychiatric: a normal mood and affect.  behavior is normal.   CBC Lab Results  Component Value Date   WBC 4.7 09/20/2020   RBC 3.89 09/20/2020   RBC 3.93 09/20/2020   HGB 11.3 (L) 09/20/2020   HCT 35.3 (L) 09/20/2020   PLT 205 09/20/2020   MCV 90.7 09/20/2020   MCH 29.0 09/20/2020   MCHC 32.0 09/20/2020   RDW 14.5 09/20/2020   LYMPHSABS 0.7 09/20/2020  MONOABS 0.5 09/20/2020   EOSABS 0.2 09/20/2020    BMET Lab Results  Component Value Date   NA 138 09/20/2020   K 4.2 09/20/2020   CL 99 09/20/2020   CO2 30 09/20/2020   GLUCOSE 99 09/20/2020   BUN 15 09/20/2020   CREATININE 0.84 09/20/2020   CALCIUM 8.9 09/20/2020   GFRNONAA >60 09/20/2020   GFRAA >60 12/01/2019   Assessment and Plan  Bronchiectasis/plumonary MAC = continue with observation off of antibiotic. Continue with inhalers  Unintentional weight loss/appetite loss = I was hoping that she would have gained more weight since has been off meds for 4 months. We discussed the use of appetite stimulant but she was not in favor of taking more medications. She will try to supplement her meals with protein drinks and try to make more effort for calorie rich  foods

## 2020-10-06 NOTE — Progress Notes (Signed)
Remote pacemaker transmission.   

## 2020-10-13 ENCOUNTER — Telehealth: Payer: Self-pay | Admitting: Family Medicine

## 2020-10-13 NOTE — Telephone Encounter (Signed)
Pt had documents at front desk to pick up ([employer] Benefit statement - 3 pages pt had brought in to fill out document) Pt never picked up document (11-10-2019). Document was mailed to pt.

## 2020-10-14 ENCOUNTER — Other Ambulatory Visit: Payer: Self-pay | Admitting: Family Medicine

## 2020-10-14 DIAGNOSIS — G4700 Insomnia, unspecified: Secondary | ICD-10-CM

## 2020-10-17 ENCOUNTER — Other Ambulatory Visit: Payer: Self-pay

## 2020-10-17 ENCOUNTER — Telehealth: Payer: Self-pay | Admitting: Family Medicine

## 2020-10-17 ENCOUNTER — Telehealth: Payer: Self-pay | Admitting: Pharmacist

## 2020-10-17 DIAGNOSIS — K909 Intestinal malabsorption, unspecified: Secondary | ICD-10-CM

## 2020-10-17 NOTE — Telephone Encounter (Signed)
Medication: zolpidem (AMBIEN) 5 MG tablet   Has the patient contacted their pharmacy? Yes.   (If no, request that the patient contact the pharmacy for the refill.) (If yes, when and what did the pharmacy advise?) 10/14/20   Preferred Pharmacy (with phone number or street name): [facility], [place] [address], [place] [address]  Phone:  [phone]  Fax:  [phone]   Agent: Please be advised that RX refills may take up to 3 business days. We ask that you follow-up with your pharmacy.

## 2020-10-17 NOTE — Chronic Care Management (AMB) (Addendum)
Chronic Care Management Pharmacy Assistant   Name: [name]  MRN: [id number] DOB: 1945-07-05  Reason for Encounter: COPD Disease State Call  Recent office visits:  No visits noted  Recent consult visits:  09/28/20- [facility] (Infectious Diseases)- seen for follow up of pulmonary MAC,  started Macrobid 100 mg daily, follow up 4 months  Hospital visits:  None in previous 6 months  Medications: Outpatient Encounter Medications as of 10/17/2020  Medication Sig Note   acetaminophen (TYLENOL) 650 MG CR tablet Take 650 mg by mouth every 8 (eight) hours as needed for pain.    albuterol (VENTOLIN HFA) 108 (90 Base) MCG/ACT inhaler Inhale 2 puffs into the lungs every 6 (six) hours as needed for wheezing or shortness of breath. 06/20/2020: No longer getting from PAP - now available generically   amLODipine (NORVASC) 2.5 MG tablet TAKE 1 TABLET BY MOUTH AT BEDTIME    apixaban (ELIQUIS) 5 MG TABS tablet Take 1 tablet (5 mg total) by mouth 2 (two) times daily. 09/27/2020: Approved to received thru [facility] PAP from 09/08/2020 thru 03/18/2021   atorvastatin (LIPITOR) 20 MG tablet TAKE 1 TABLET BY MOUTH ONCE DAILY AT  6PM (Patient taking differently: TAKE 1 TABLET BY MOUTH ONCE DAILY AT  6PM)    fluconazole (DIFLUCAN) 150 MG tablet Take 1 tablet by mouth daily.    furosemide (LASIX) 40 MG tablet TAKE 1 TABLET BY MOUTH IN THE MORNING    gabapentin (NEURONTIN) 600 MG tablet Take 1 tablet (600 mg total) by mouth in the morning, at noon, in the evening, and at bedtime.    lidocaine-prilocaine (EMLA) cream Apply 1 application topically as needed. (Patient taking differently: Apply 1 application topically as needed (for port access- to numb).)    nitrofurantoin, macrocrystal-monohydrate, (MACROBID) 100 MG capsule Take 1 capsule (100 mg total) by mouth daily.    nitroGLYCERIN (NITROSTAT) 0.4 MG SL tablet Place 1 tablet (0.4 mg total) under the tongue every 5 (five) minutes as needed.  CHEST PAIN    oxyCODONE-acetaminophen (PERCOCET) 10-325 MG tablet Take 1 tablet by mouth every 4 (four) hours as needed. PAIN    OXYGEN Inhale 3 L/min into the lungs continuous.     pantoprazole (PROTONIX) 40 MG tablet Take 1 tablet by mouth twice daily    tiZANidine (ZANAFLEX) 4 MG tablet Take 1 tablet by mouth three times daily as needed for muscle spasm    triamcinolone ointment (KENALOG) 0.1 % Apply 1 application topically daily as needed (to affected areas- for psoriasis).     umeclidinium bromide (INCRUSE ELLIPTA) 62.5 MCG/INH AEPB Inhale 1 puff into the lungs daily. 07/26/2020: Approved to received [facility] patient assistance 07/13/2020 to 03/18/2021   zolpidem (AMBIEN) 5 MG tablet TAKE 1 TABLET BY MOUTH EVERY DAY AT BEDTIME AS NEEDED    [DISCONTINUED] iron dextran complex (INFED) 50 MG/ML injection Please give infed infusion (no test dose needed) over 4 hours per pharmacy calculated dose. Ht: 5'2 Wt: 99 pounds Hgb: 12    No facility-administered encounter medications on file as of 10/17/2020.   Current COPD regimen:  Incruse ellipta 62.36m/inh once daily  Proventil   Any recent hospitalizations or ED visits since last visit with CPP? No  What recent interventions/DTPs have been made by any provider to improve breathing since last visit: No recent interventions  Have you had exacerbation/flare-up since last visit?  Patient stated she has extreme flare ups at least 3-4 times weekly  What do you do when  you are short of breath?   Patient stated she uses a combination of inhaler, oxygen, and rest dependent on the situation   Respiratory Devices/Equipment Do you have a nebulizer?  Yes Do you use a Peak Flow Meter? Yes, but does not actively use it  Do you use a maintenance inhaler? Yes, every morning  How often do you forget to use your daily inhaler? Never Do you use a rescue inhaler? Yes How often do you use your rescue inhaler?   at least 3-4 times weekly Do you use a  spacer with your inhaler? No  Adherence Review: Does the patient have >5 day gap between last estimated fill date for maintenance inhaler medications?  No fill hx, Incruse filled by patient assistance, proventil obtained OTC  Star Rating Drugs: Atorvastatin 20 mg- Patient reported no longer taking   [employer], CMA

## 2020-10-17 NOTE — Telephone Encounter (Signed)
Requesting: Ambien  Contract: 04/25/2020  UDS: 04/25/20 Last OV: 07/22/20 Next OV: N/A Last Refill: 09/08/20, #30-0 RF Database:   Please advise

## 2020-10-17 NOTE — Telephone Encounter (Signed)
Refill request sent to PCP via Rx request from pharmacy

## 2020-10-18 ENCOUNTER — Other Ambulatory Visit: Payer: Self-pay

## 2020-10-18 ENCOUNTER — Inpatient Hospital Stay: Payer: HMO

## 2020-10-18 ENCOUNTER — Inpatient Hospital Stay: Payer: HMO | Attending: Family

## 2020-10-18 VITALS — BP 139/58 | HR 71 | Temp 98.1°F | Resp 18

## 2020-10-18 DIAGNOSIS — N189 Chronic kidney disease, unspecified: Secondary | ICD-10-CM | POA: Insufficient documentation

## 2020-10-18 DIAGNOSIS — Z95828 Presence of other vascular implants and grafts: Secondary | ICD-10-CM

## 2020-10-18 DIAGNOSIS — K909 Intestinal malabsorption, unspecified: Secondary | ICD-10-CM

## 2020-10-18 DIAGNOSIS — D500 Iron deficiency anemia secondary to blood loss (chronic): Secondary | ICD-10-CM

## 2020-10-18 DIAGNOSIS — D631 Anemia in chronic kidney disease: Secondary | ICD-10-CM | POA: Diagnosis not present

## 2020-10-18 LAB — CBC WITH DIFFERENTIAL (CANCER CENTER ONLY)
Abs Immature Granulocytes: 0.03 10*3/uL (ref 0.00–0.07)
Basophils Absolute: 0.1 10*3/uL (ref 0.0–0.1)
Basophils Relative: 1 %
Eosinophils Absolute: 0.2 10*3/uL (ref 0.0–0.5)
Eosinophils Relative: 3 %
HCT: 31.1 % — ABNORMAL LOW (ref 36.0–46.0)
Hemoglobin: 10 g/dL — ABNORMAL LOW (ref 12.0–15.0)
Immature Granulocytes: 1 %
Lymphocytes Relative: 9 %
Lymphs Abs: 0.6 10*3/uL — ABNORMAL LOW (ref 0.7–4.0)
MCH: 29.3 pg (ref 26.0–34.0)
MCHC: 32.2 g/dL (ref 30.0–36.0)
MCV: 91.2 fL (ref 80.0–100.0)
Monocytes Absolute: 0.6 10*3/uL (ref 0.1–1.0)
Monocytes Relative: 9 %
Neutro Abs: 5.1 10*3/uL (ref 1.7–7.7)
Neutrophils Relative %: 77 %
Platelet Count: 225 10*3/uL (ref 150–400)
RBC: 3.41 MIL/uL — ABNORMAL LOW (ref 3.87–5.11)
RDW: 14.6 % (ref 11.5–15.5)
WBC Count: 6.6 10*3/uL (ref 4.0–10.5)
nRBC: 0 % (ref 0.0–0.2)

## 2020-10-18 LAB — CMP (CANCER CENTER ONLY)
ALT: 14 U/L (ref 0–44)
AST: 25 U/L (ref 15–41)
Albumin: 4.2 g/dL (ref 3.5–5.0)
Alkaline Phosphatase: 119 U/L (ref 38–126)
Anion gap: 8 (ref 5–15)
BUN: 20 mg/dL (ref 8–23)
CO2: 31 mmol/L (ref 22–32)
Calcium: 8.9 mg/dL (ref 8.9–10.3)
Chloride: 98 mmol/L (ref 98–111)
Creatinine: 0.95 mg/dL (ref 0.44–1.00)
GFR, Estimated: >60 mL/min (ref >60)
Glucose, Bld: 93 mg/dL (ref 70–99)
Potassium: 4.5 mmol/L (ref 3.5–5.1)
Sodium: 137 mmol/L (ref 135–145)
Total Bilirubin: 0.4 mg/dL (ref 0.3–1.2)
Total Protein: 7 g/dL (ref 6.5–8.1)

## 2020-10-18 MED ORDER — EPOETIN ALFA-EPBX 40000 UNIT/ML IJ SOLN
INTRAMUSCULAR | Status: AC
  Filled 2020-10-18: qty 1

## 2020-10-18 MED ORDER — EPOETIN ALFA-EPBX 40000 UNIT/ML IJ SOLN
40000.0000 [IU] | Freq: Once | INTRAMUSCULAR | Status: AC
  Administered 2020-10-18: 40000 [IU] via SUBCUTANEOUS

## 2020-10-18 MED ORDER — HEPARIN SOD (PORK) LOCK FLUSH 100 UNIT/ML IV SOLN
500.0000 [IU] | Freq: Once | INTRAVENOUS | Status: AC
  Administered 2020-10-18: 500 [IU] via INTRAVENOUS
  Filled 2020-10-18: qty 5

## 2020-10-18 MED ORDER — SODIUM CHLORIDE 0.9% FLUSH
10.0000 mL | Freq: Once | INTRAVENOUS | Status: AC
Start: 2020-10-18 — End: 2020-10-18
  Administered 2020-10-18: 10 mL via INTRAVENOUS
  Filled 2020-10-18: qty 10

## 2020-10-18 NOTE — Patient Instructions (Signed)
Epoetin Alfa injection What is this medication? EPOETIN ALFA (e POE e tin AL fa) helps your body make more red blood cells. This medicine is used to treat anemia caused by chronic kidney disease, cancer chemotherapy, or HIV-therapy. It may also be used before surgery if you have anemia. This medicine may be used for other purposes; ask your health care provider or pharmacist if you have questions. COMMON BRAND NAME(S): Epogen, Procrit, Retacrit What should I tell my care team before I take this medication? They need to know if you have any of these conditions: cancer heart disease high blood pressure history of blood clots history of stroke low levels of folate, iron, or vitamin B12 in the blood seizures an unusual or allergic reaction to erythropoietin, albumin, benzyl alcohol, hamster proteins, other medicines, foods, dyes, or preservatives pregnant or trying to get pregnant breast-feeding How should I use this medication? This medicine is for injection into a vein or under the skin. It is usually given by a health care professional in a hospital or clinic setting. If you get this medicine at home, you will be taught how to prepare and give this medicine. Use exactly as directed. Take your medicine at regular intervals. Do not take your medicine more often than directed. It is important that you put your used needles and syringes in a special sharps container. Do not put them in a trash can. If you do not have a sharps container, call your pharmacist or healthcare provider to get one. A special MedGuide will be given to you by the pharmacist with each prescription and refill. Be sure to read this information carefully each time. Talk to your pediatrician regarding the use of this medicine in children. While this drug may be prescribed for selected conditions, precautions do apply. Overdosage: If you think you have taken too much of this medicine contact a poison control center or emergency  room at once. NOTE: This medicine is only for you. Do not share this medicine with others. What if I miss a dose? If you miss a dose, take it as soon as you can. If it is almost time for your next dose, take only that dose. Do not take double or extra doses. What may interact with this medication? Interactions have not been studied. This list may not describe all possible interactions. Give your health care provider a list of all the medicines, herbs, non-prescription drugs, or dietary supplements you use. Also tell them if you smoke, drink alcohol, or use illegal drugs. Some items may interact with your medicine. What should I watch for while using this medication? Your condition will be monitored carefully while you are receiving this medicine. You may need blood work done while you are taking this medicine. This medicine may cause a decrease in vitamin B6. You should make sure that you get enough vitamin B6 while you are taking this medicine. Discuss the foods you eat and the vitamins you take with your health care professional. What side effects may I notice from receiving this medication? Side effects that you should report to your doctor or health care professional as soon as possible: allergic reactions like skin rash, itching or hives, swelling of the face, lips, or tongue seizures signs and symptoms of a blood clot such as breathing problems; changes in vision; chest pain; severe, sudden headache; pain, swelling, warmth in the leg; trouble speaking; sudden numbness or weakness of the face, arm or leg signs and symptoms of a stroke like   changes in vision; confusion; trouble speaking or understanding; severe headaches; sudden numbness or weakness of the face, arm or leg; trouble walking; dizziness; loss of balance or coordination Side effects that usually do not require medical attention (report to your doctor or health care professional if they continue or are  bothersome): chills cough dizziness fever headaches joint pain muscle cramps muscle pain nausea, vomiting pain, redness, or irritation at site where injected This list may not describe all possible side effects. Call your doctor for medical advice about side effects. You may report side effects to FDA at 1-800-FDA-1088. Where should I keep my medication? Keep out of the reach of children. Store in a refrigerator between 2 and 8 degrees C (36 and 46 degrees F). Do not freeze or shake. Throw away any unused portion if using a single-dose vial. Multi-dose vials can be kept in the refrigerator for up to 21 days after the initial dose. Throw away unused medicine. NOTE: This sheet is a summary. It may not cover all possible information. If you have questions about this medicine, talk to your doctor, pharmacist, or health care provider.  2022 Elsevier/Gold Standard (2016-10-12 08:35:19)  

## 2020-10-18 NOTE — Patient Instructions (Signed)

## 2020-10-21 ENCOUNTER — Telehealth: Payer: Self-pay

## 2020-10-21 ENCOUNTER — Other Ambulatory Visit: Payer: Self-pay | Admitting: Family Medicine

## 2020-10-21 DIAGNOSIS — R52 Pain, unspecified: Secondary | ICD-10-CM

## 2020-10-21 IMAGING — US US ABDOMEN LIMITED
1 series · 14 of 25 positions shown · non-contrast
Comparison: Abdominal ultrasound 06/20/2017

CLINICAL DATA: Right upper quadrant pain

EXAM:
ULTRASOUND ABDOMEN LIMITED RIGHT UPPER QUADRANT

[Series 1: us abdomen limited · 14 of 26 slices shown]
[im 1/26]
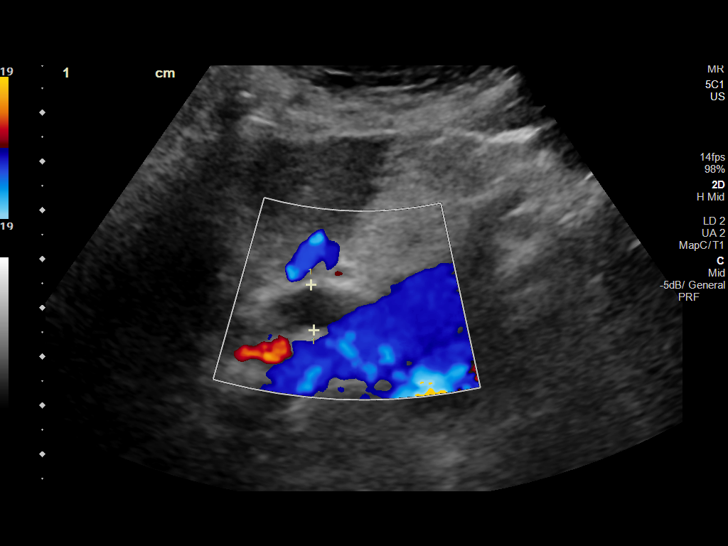
[im 3/26]
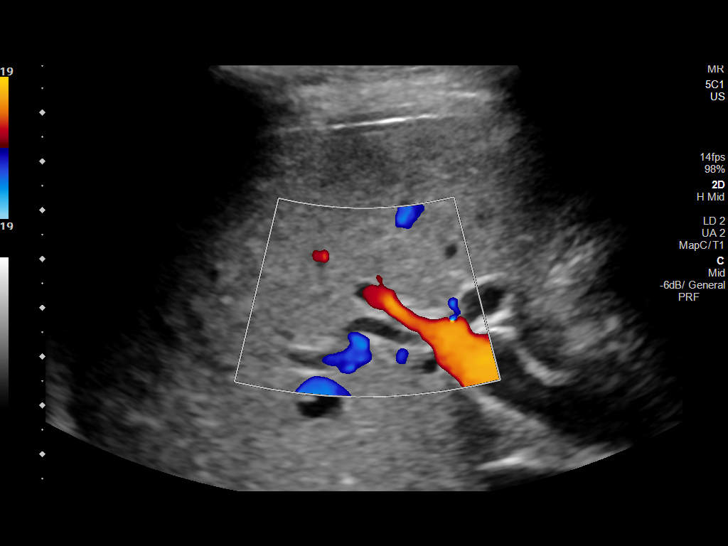
[im 5/26]
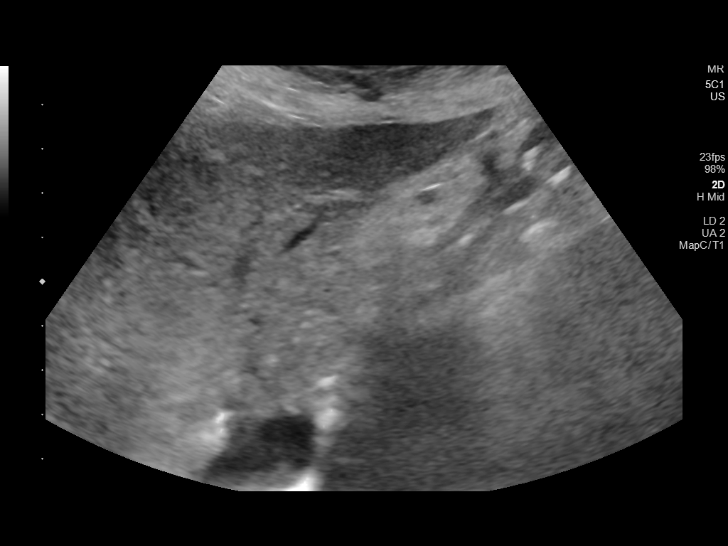
[im 7/26]
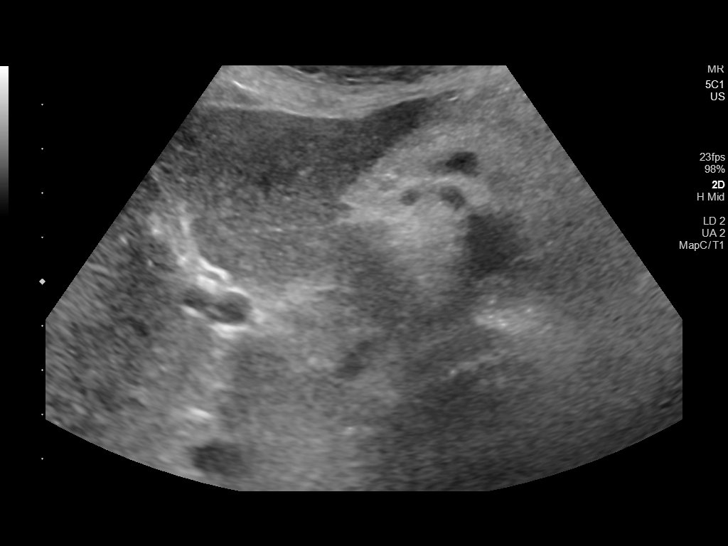
[im 9/26]
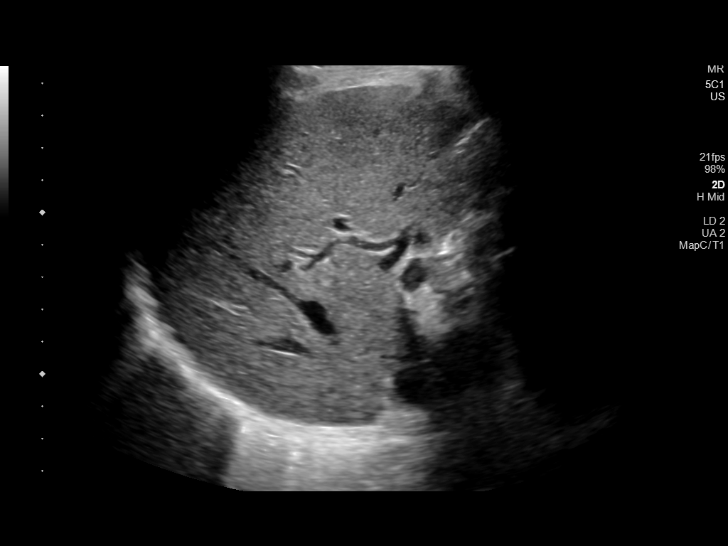
[im 10/26]
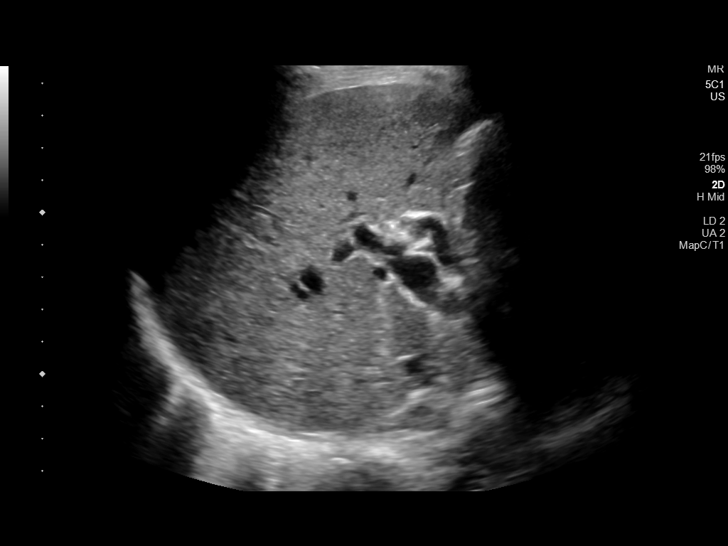
[im 12/26]
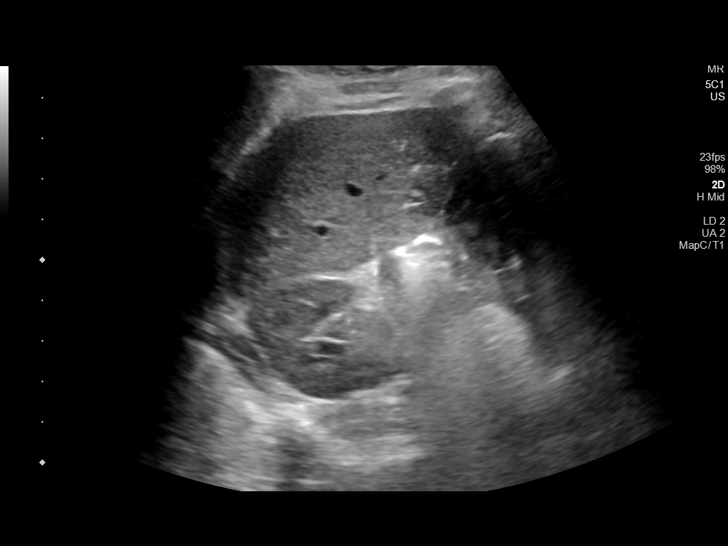
[im 14/26]
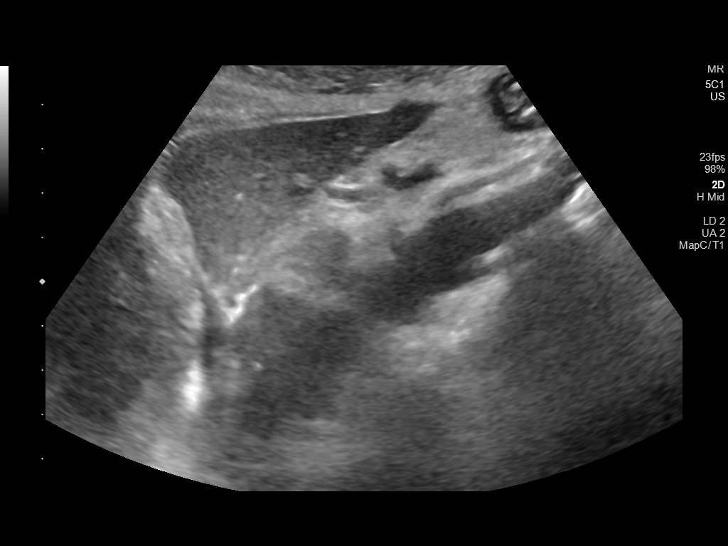
[im 16/26]
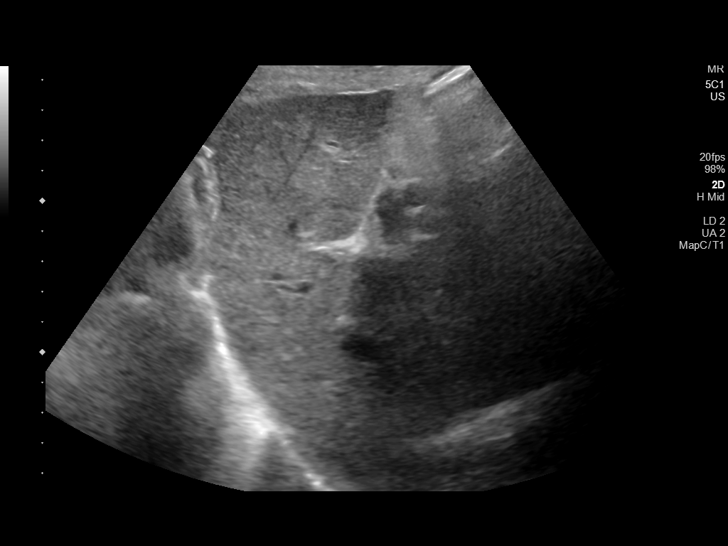
[im 17/26]
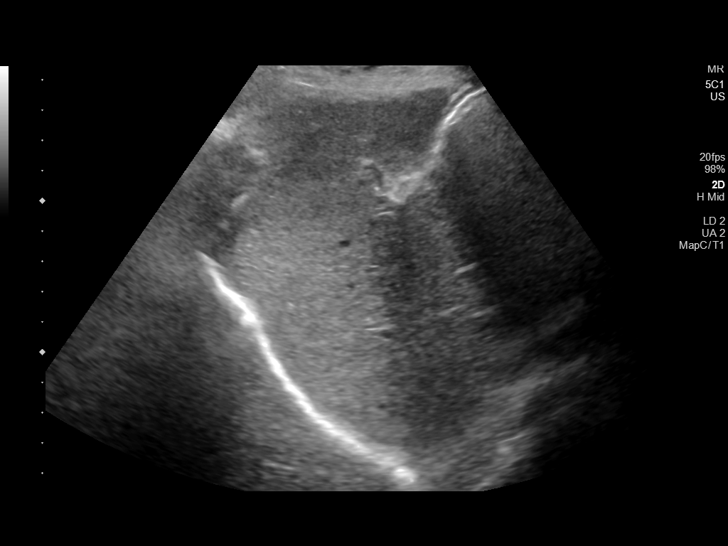
[im 19/26]
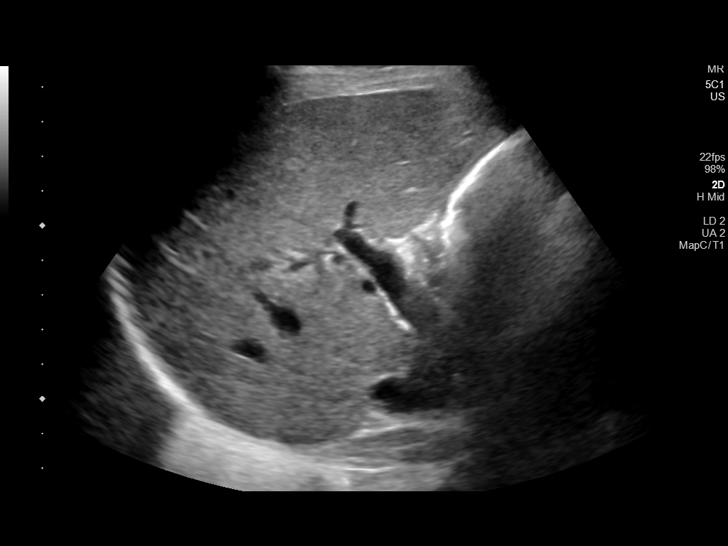
[im 21/26]
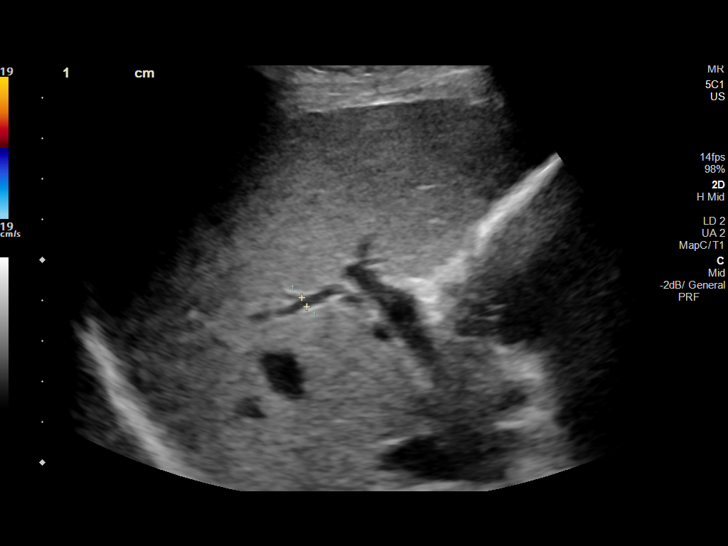
[im 23/26]
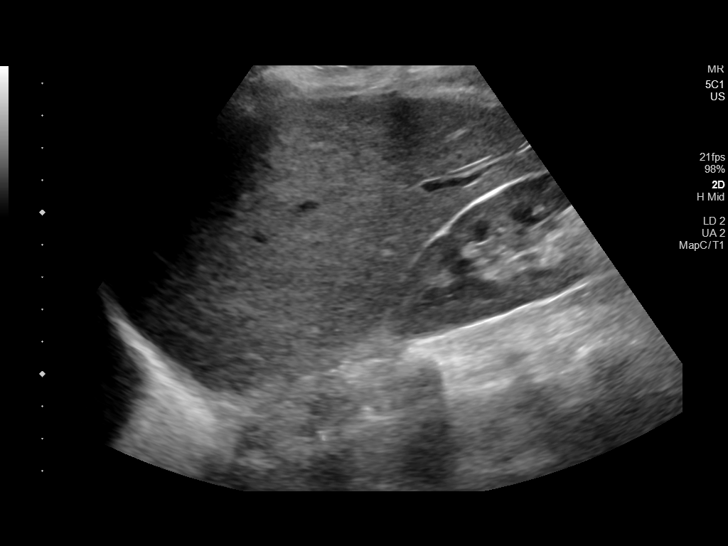
[im 26/26]
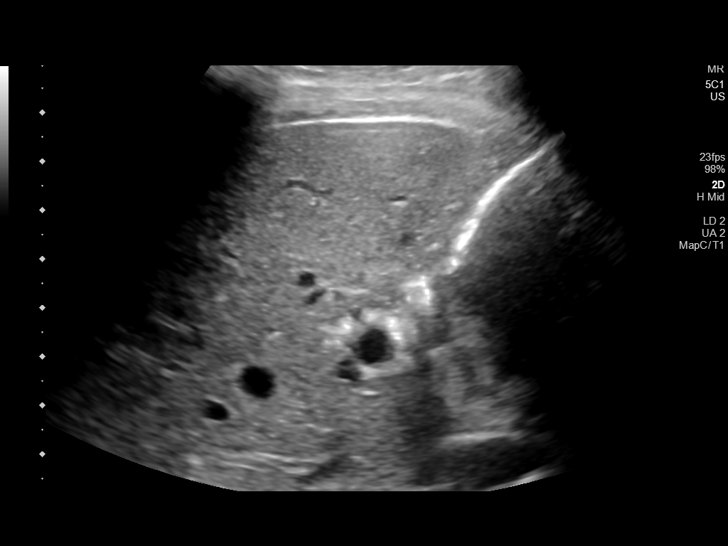

[14 of 25 positions shown; findings below may reference images not displayed]

FINDINGS: Gallbladder:

Surgically absent.

Common bile duct:

Diameter: 0.9 cm, within normal limits given history of
cholecystectomy.

Liver:

No focal lesion identified. Within normal limits in parenchymal
echogenicity. Portal vein is patent on color Doppler imaging with
normal direction of blood flow towards the liver.

Other: Mild intrahepatic biliary duct dilation.
IMPRESSION: 1. No definite acute findings to explain the patient's right upper
quadrant pain.

2. There is mild intrahepatic biliary duct dilation which may be
secondary to patient's cholecystectomy.

## 2020-10-21 MED ORDER — OXYCODONE-ACETAMINOPHEN 10-325 MG PO TABS: 1.0000 | ORAL_TABLET | ORAL | 0 refills | Status: DC | PRN

## 2020-10-21 NOTE — Telephone Encounter (Signed)
Medication: oxyCODONE-acetaminophen (PERCOCET) 10-325 MG tablet   Has the patient contacted their pharmacy? Yes.   (If no, request that the patient contact the pharmacy for the refill.) (If yes, when and what did the pharmacy advise?)  Preferred Pharmacy (with phone number or street name):  [facility], [address] [place], [place] [address]  Phone:  [phone]  Fax:  [phone]   Agent: Please be advised that RX refills may take up to 3 business days. We ask that you follow-up with your pharmacy.

## 2020-10-21 NOTE — Telephone Encounter (Signed)
Requesting:percocet 10-325 mg Contract:04/25/20 UDS:04/25/20 Last Visit:07/22/20 Next Visit:unknown Last Refill:09/23/20  Please Advise

## 2020-10-24 NOTE — Telephone Encounter (Signed)
Left message on machine that rx has been sent in. 

## 2020-10-31 ENCOUNTER — Other Ambulatory Visit: Payer: Self-pay | Admitting: Family Medicine

## 2020-10-31 ENCOUNTER — Other Ambulatory Visit: Payer: Self-pay | Admitting: Internal Medicine

## 2020-10-31 DIAGNOSIS — R52 Pain, unspecified: Secondary | ICD-10-CM

## 2020-11-07 ENCOUNTER — Other Ambulatory Visit: Payer: Self-pay | Admitting: Cardiology

## 2020-11-15 ENCOUNTER — Other Ambulatory Visit: Payer: Self-pay | Admitting: Family Medicine

## 2020-11-15 DIAGNOSIS — G4700 Insomnia, unspecified: Secondary | ICD-10-CM

## 2020-11-15 MED ORDER — ZOLPIDEM TARTRATE 5 MG PO TABS: ORAL_TABLET | ORAL | 0 refills | Status: DC

## 2020-11-15 NOTE — Telephone Encounter (Signed)
Requesting: Ambien  Contract: 04/25/20 UDS: 04/25/20 Last OV: 07/22/20 Next OV: N/A Last Refill: 10/17/20 Database:   Please advise

## 2020-11-17 ENCOUNTER — Other Ambulatory Visit: Payer: Self-pay | Admitting: Family Medicine

## 2020-11-17 DIAGNOSIS — R52 Pain, unspecified: Secondary | ICD-10-CM

## 2020-11-17 MED ORDER — OXYCODONE-ACETAMINOPHEN 10-325 MG PO TABS: 1.0000 | ORAL_TABLET | ORAL | 0 refills | Status: DC | PRN

## 2020-11-17 NOTE — Telephone Encounter (Signed)
Requesting: Oxycodone-acetaminophen  Contract: 02/2017 UDS: 02/2017 Last Visit: 07/22/2020 Next Visit: none scheduled with pcp Last Refill:10/21/2020  Please Advise

## 2020-11-17 NOTE — Telephone Encounter (Signed)
Medication: oxyCODONE-acetaminophen (PERCOCET) 10-325 MG tablet   Has the patient contacted their pharmacy? No. (If no, request that the patient contact the pharmacy for the refill.) (If yes, when and what did the pharmacy advise?)  Preferred Pharmacy (with phone number or street name):  [facility], [place] [place], [place] [address]  Phone:  [phone]     Agent: Please be advised that RX refills may take up to 3 business days. We ask that you follow-up with your pharmacy.

## 2020-11-22 ENCOUNTER — Inpatient Hospital Stay: Payer: HMO | Attending: Family

## 2020-11-22 ENCOUNTER — Inpatient Hospital Stay: Payer: HMO | Admitting: Family

## 2020-11-22 ENCOUNTER — Other Ambulatory Visit: Payer: Self-pay

## 2020-11-22 ENCOUNTER — Inpatient Hospital Stay: Payer: HMO

## 2020-11-22 ENCOUNTER — Ambulatory Visit: Payer: HMO

## 2020-11-22 ENCOUNTER — Telehealth: Payer: Self-pay | Admitting: *Deleted

## 2020-11-22 VITALS — BP 136/73 | HR 82 | Temp 98.0°F | Wt 90.0 lb

## 2020-11-22 DIAGNOSIS — D500 Iron deficiency anemia secondary to blood loss (chronic): Secondary | ICD-10-CM

## 2020-11-22 DIAGNOSIS — D638 Anemia in other chronic diseases classified elsewhere: Secondary | ICD-10-CM | POA: Diagnosis not present

## 2020-11-22 DIAGNOSIS — N189 Chronic kidney disease, unspecified: Secondary | ICD-10-CM | POA: Diagnosis not present

## 2020-11-22 DIAGNOSIS — D631 Anemia in chronic kidney disease: Secondary | ICD-10-CM | POA: Diagnosis not present

## 2020-11-22 DIAGNOSIS — K909 Intestinal malabsorption, unspecified: Secondary | ICD-10-CM

## 2020-11-22 LAB — CBC WITH DIFFERENTIAL (CANCER CENTER ONLY)
Abs Immature Granulocytes: 0.05 10*3/uL (ref 0.00–0.07)
Basophils Absolute: 0.1 10*3/uL (ref 0.0–0.1)
Basophils Relative: 1 %
Eosinophils Absolute: 0.2 10*3/uL (ref 0.0–0.5)
Eosinophils Relative: 3 %
HCT: 32.9 % — ABNORMAL LOW (ref 36.0–46.0)
Hemoglobin: 10.4 g/dL — ABNORMAL LOW (ref 12.0–15.0)
Immature Granulocytes: 1 %
Lymphocytes Relative: 9 %
Lymphs Abs: 0.5 10*3/uL — ABNORMAL LOW (ref 0.7–4.0)
MCH: 29.1 pg (ref 26.0–34.0)
MCHC: 31.6 g/dL (ref 30.0–36.0)
MCV: 91.9 fL (ref 80.0–100.0)
Monocytes Absolute: 0.5 10*3/uL (ref 0.1–1.0)
Monocytes Relative: 9 %
Neutro Abs: 4.3 10*3/uL (ref 1.7–7.7)
Neutrophils Relative %: 77 %
Platelet Count: 212 10*3/uL (ref 150–400)
RBC: 3.58 MIL/uL — ABNORMAL LOW (ref 3.87–5.11)
RDW: 14.4 % (ref 11.5–15.5)
WBC Count: 5.5 10*3/uL (ref 4.0–10.5)
nRBC: 0 % (ref 0.0–0.2)

## 2020-11-22 LAB — CMP (CANCER CENTER ONLY)
ALT: 19 U/L (ref 0–44)
AST: 27 U/L (ref 15–41)
Albumin: 3.9 g/dL (ref 3.5–5.0)
Alkaline Phosphatase: 117 U/L (ref 38–126)
Anion gap: 4 — ABNORMAL LOW (ref 5–15)
BUN: 18 mg/dL (ref 8–23)
CO2: 33 mmol/L — ABNORMAL HIGH (ref 22–32)
Calcium: 8.5 mg/dL — ABNORMAL LOW (ref 8.9–10.3)
Chloride: 102 mmol/L (ref 98–111)
Creatinine: 0.84 mg/dL (ref 0.44–1.00)
GFR, Estimated: >60 mL/min (ref >60)
Glucose, Bld: 106 mg/dL — ABNORMAL HIGH (ref 70–99)
Potassium: 4.1 mmol/L (ref 3.5–5.1)
Sodium: 139 mmol/L (ref 135–145)
Total Bilirubin: 0.4 mg/dL (ref 0.3–1.2)
Total Protein: 6.3 g/dL — ABNORMAL LOW (ref 6.5–8.1)

## 2020-11-22 LAB — SAVE SMEAR(SSMR), FOR PROVIDER SLIDE REVIEW

## 2020-11-22 LAB — RETICULOCYTES
Immature Retic Fract: 3.7 % (ref 2.3–15.9)
RBC.: 3.54 MIL/uL — ABNORMAL LOW (ref 3.87–5.11)
Retic Count, Absolute: 22.3 10*3/uL (ref 19.0–186.0)
Retic Ct Pct: 0.6 % (ref 0.4–3.1)

## 2020-11-22 MED ORDER — HEPARIN SOD (PORK) LOCK FLUSH 100 UNIT/ML IV SOLN
500.0000 [IU] | Freq: Once | INTRAVENOUS | Status: AC | PRN
  Administered 2020-11-22: 500 [IU]

## 2020-11-22 MED ORDER — HEPARIN SOD (PORK) LOCK FLUSH 100 UNIT/ML IV SOLN: 500.0000 [IU] | Freq: Once | INTRAVENOUS | Status: DC | PRN

## 2020-11-22 MED ORDER — SODIUM CHLORIDE 0.9% FLUSH: 10.0000 mL | INTRAVENOUS | Status: DC | PRN

## 2020-11-22 MED ORDER — EPOETIN ALFA-EPBX 40000 UNIT/ML IJ SOLN
40000.0000 [IU] | Freq: Once | INTRAMUSCULAR | Status: AC
  Administered 2020-11-22: 40000 [IU] via SUBCUTANEOUS

## 2020-11-22 MED ORDER — SODIUM CHLORIDE 0.9% FLUSH
10.0000 mL | INTRAVENOUS | Status: DC | PRN
  Administered 2020-11-22: 10 mL

## 2020-11-22 NOTE — Patient Instructions (Signed)

## 2020-11-22 NOTE — Patient Instructions (Signed)
Epoetin Alfa injection What is this medication? EPOETIN ALFA (e POE e tin AL fa) helps your body make more red blood cells. This medicine is used to treat anemia caused by chronic kidney disease, cancer chemotherapy, or HIV-therapy. It may also be used before surgery if you have anemia. This medicine may be used for other purposes; ask your health care provider or pharmacist if you have questions. COMMON BRAND NAME(S): Epogen, Procrit, Retacrit What should I tell my care team before I take this medication? They need to know if you have any of these conditions: cancer heart disease high blood pressure history of blood clots history of stroke low levels of folate, iron, or vitamin B12 in the blood seizures an unusual or allergic reaction to erythropoietin, albumin, benzyl alcohol, hamster proteins, other medicines, foods, dyes, or preservatives pregnant or trying to get pregnant breast-feeding How should I use this medication? This medicine is for injection into a vein or under the skin. It is usually given by a health care professional in a hospital or clinic setting. If you get this medicine at home, you will be taught how to prepare and give this medicine. Use exactly as directed. Take your medicine at regular intervals. Do not take your medicine more often than directed. It is important that you put your used needles and syringes in a special sharps container. Do not put them in a trash can. If you do not have a sharps container, call your pharmacist or healthcare provider to get one. A special MedGuide will be given to you by the pharmacist with each prescription and refill. Be sure to read this information carefully each time. Talk to your pediatrician regarding the use of this medicine in children. While this drug may be prescribed for selected conditions, precautions do apply. Overdosage: If you think you have taken too much of this medicine contact a poison control center or emergency  room at once. NOTE: This medicine is only for you. Do not share this medicine with others. What if I miss a dose? If you miss a dose, take it as soon as you can. If it is almost time for your next dose, take only that dose. Do not take double or extra doses. What may interact with this medication? Interactions have not been studied. This list may not describe all possible interactions. Give your health care provider a list of all the medicines, herbs, non-prescription drugs, or dietary supplements you use. Also tell them if you smoke, drink alcohol, or use illegal drugs. Some items may interact with your medicine. What should I watch for while using this medication? Your condition will be monitored carefully while you are receiving this medicine. You may need blood work done while you are taking this medicine. This medicine may cause a decrease in vitamin B6. You should make sure that you get enough vitamin B6 while you are taking this medicine. Discuss the foods you eat and the vitamins you take with your health care professional. What side effects may I notice from receiving this medication? Side effects that you should report to your doctor or health care professional as soon as possible: allergic reactions like skin rash, itching or hives, swelling of the face, lips, or tongue seizures signs and symptoms of a blood clot such as breathing problems; changes in vision; chest pain; severe, sudden headache; pain, swelling, warmth in the leg; trouble speaking; sudden numbness or weakness of the face, arm or leg signs and symptoms of a stroke like   changes in vision; confusion; trouble speaking or understanding; severe headaches; sudden numbness or weakness of the face, arm or leg; trouble walking; dizziness; loss of balance or coordination Side effects that usually do not require medical attention (report to your doctor or health care professional if they continue or are  bothersome): chills cough dizziness fever headaches joint pain muscle cramps muscle pain nausea, vomiting pain, redness, or irritation at site where injected This list may not describe all possible side effects. Call your doctor for medical advice about side effects. You may report side effects to FDA at 1-800-FDA-1088. Where should I keep my medication? Keep out of the reach of children. Store in a refrigerator between 2 and 8 degrees C (36 and 46 degrees F). Do not freeze or shake. Throw away any unused portion if using a single-dose vial. Multi-dose vials can be kept in the refrigerator for up to 21 days after the initial dose. Throw away unused medicine. NOTE: This sheet is a summary. It may not cover all possible information. If you have questions about this medicine, talk to your doctor, pharmacist, or health care provider.  2022 Elsevier/Gold Standard (2016-10-12 08:35:19)  

## 2020-11-22 NOTE — Progress Notes (Signed)
Hematology and Oncology Follow Up Visit  [name] [id number] 06-04-1945 75 y.o. 11/22/2020   Principle Diagnosis:  Iron deficiency anemia Erythropoietin deficient anemia    Current Therapy:        IV iron as indicated  Retacrit 40,000 units SQ for hgb < 11   Interim History:  [name] is here today for follow-up. She is doing well but does note fatigue at times.  She takes a break to rest when needed.  She enjoyed having a surprise visit from her daughter over the weekend.  Hgb is 10.4, MCV 91, platelets 212 and WBC count 5.5.  She has not noted any blood loss. No abnormal bruising, no petechiae.  She is doing well on 3L [place] supplemental O2 24 hours a day. SOB is stable.  No fever, chills, n/v, cough, rash, dizziness, chest pain, palpitations, abdominal pain or changes in bowel or bladder habits.  No swelling, tenderness in her extremities at this time.  She has intermittent tingling in her fingertips.  No falls or syncope to report.  She has maintained a good appetite and is staying well hydrated. Her weight is stable at 90 lbs.   ECOG Performance Status: 1 - Symptomatic but completely ambulatory  Medications:  Allergies as of 11/22/2020       Reactions   Dabigatran Etexilate Mesylate Other (See Comments)   INTERNAL BLEEDING- Pradaxa   Augmentin [amoxicillin-pot Clavulanate] Diarrhea, Nausea Only   Did it involve swelling of the face/tongue/throat, SOB, or low BP? No Did it involve sudden or severe rash/hives, skin peeling, or any reaction on the inside of your mouth or nose? No Did you need to seek medical attention at a hospital or doctor's office? No When did it last happen? 02/2019 If all above answers are "NO", may proceed with cephalosporin use.   Talwin [pentazocine] Other (See Comments)   Hallucinations        Medication List        Accurate as of November 22, 2020 11:16 AM. If you have any questions, ask your nurse or doctor.           acetaminophen 650 MG CR tablet Commonly known as: TYLENOL Take 650 mg by mouth every 8 (eight) hours as needed for pain.   albuterol 108 (90 Base) MCG/ACT inhaler Commonly known as: VENTOLIN HFA Inhale 2 puffs into the lungs every 6 (six) hours as needed for wheezing or shortness of breath.   amLODipine 2.5 MG tablet Commonly known as: NORVASC TAKE 1 TABLET BY MOUTH AT BEDTIME   atorvastatin 20 MG tablet Commonly known as: LIPITOR TAKE 1 TABLET BY MOUTH ONCE DAILY AT  6PM   Eliquis 5 MG Tabs tablet Generic drug: apixaban Take 1 tablet (5 mg total) by mouth 2 (two) times daily.   fluconazole 150 MG tablet Commonly known as: DIFLUCAN Take 1 tablet by mouth daily.   furosemide 40 MG tablet Commonly known as: LASIX TAKE 1 TABLET BY MOUTH ONCE DAILY IN THE MORNING   gabapentin 600 MG tablet Commonly known as: NEURONTIN Take 1 tablet (600 mg total) by mouth in the morning, at noon, in the evening, and at bedtime.   Incruse Ellipta 62.5 MCG/INH Aepb Generic drug: umeclidinium bromide Inhale 1 puff into the lungs daily.   lidocaine-prilocaine cream Commonly known as: EMLA Apply 1 application topically as needed. What changed: reasons to take this   nitrofurantoin (macrocrystal-monohydrate) 100 MG capsule Commonly known as: Macrobid Take 1 capsule (100 mg total) by  mouth daily.   nitroGLYCERIN 0.4 MG SL tablet Commonly known as: NITROSTAT Place 1 tablet (0.4 mg total) under the tongue every 5 (five) minutes as needed. CHEST PAIN   oxyCODONE-acetaminophen 10-325 MG tablet Commonly known as: PERCOCET Take 1 tablet by mouth every 4 (four) hours as needed. PAIN   OXYGEN Inhale 3 L/min into the lungs continuous.   pantoprazole 40 MG tablet Commonly known as: PROTONIX Take 1 tablet by mouth twice daily   tiZANidine 4 MG tablet Commonly known as: ZANAFLEX Take 1 tablet by mouth three times daily as needed for muscle spasm   triamcinolone ointment 0.1 % Commonly  known as: KENALOG Apply 1 application topically daily as needed (to affected areas- for psoriasis).   zolpidem 5 MG tablet Commonly known as: AMBIEN 1 po qhs prn        Allergies:  Allergies  Allergen Reactions   Dabigatran Etexilate Mesylate Other (See Comments)    INTERNAL BLEEDING- Pradaxa   Augmentin [Amoxicillin-Pot Clavulanate] Diarrhea and Nausea Only    Did it involve swelling of the face/tongue/throat, SOB, or low BP? No Did it involve sudden or severe rash/hives, skin peeling, or any reaction on the inside of your mouth or nose? No Did you need to seek medical attention at a hospital or doctor's office? No When did it last happen? 02/2019 If all above answers are "NO", may proceed with cephalosporin use.    Talwin [Pentazocine] Other (See Comments)    Hallucinations     Past Medical History, Surgical history, Social history, and Family History were reviewed and updated.  Review of Systems: All other 10 point review of systems is negative.   Physical Exam:  vitals were not taken for this visit.   Wt Readings from Last 3 Encounters:  09/28/20 85 lb (38.6 kg)  09/20/20 83 lb (37.6 kg)  08/24/20 85 lb 12.8 oz (38.9 kg)    Ocular: Sclerae unicteric, pupils equal, round and reactive to light Ear-nose-throat: Oropharynx clear, dentition fair Lymphatic: No cervical or supraclavicular adenopathy Lungs no rales or rhonchi, good excursion bilaterally Heart regular rate and rhythm, no murmur appreciated Abd soft, nontender, positive bowel sounds MSK no focal spinal tenderness, no joint edema Neuro: non-focal, well-oriented, appropriate affect Breasts: Deferred   Lab Results  Component Value Date   WBC 5.5 11/22/2020   HGB 10.4 (L) 11/22/2020   HCT 32.9 (L) 11/22/2020   MCV 91.9 11/22/2020   PLT 212 11/22/2020   Lab Results  Component Value Date   FERRITIN 974 (H) 09/20/2020   IRON 68 09/20/2020   TIBC 225 (L) 09/20/2020   UIBC 157 09/20/2020    IRONPCTSAT 30 09/20/2020   Lab Results  Component Value Date   RETICCTPCT 0.6 11/22/2020   RBC 3.58 (L) 11/22/2020   RBC 3.54 (L) 11/22/2020   RETICCTABS 64.6 04/01/2009   No results found for: KPAFRELGTCHN, LAMBDASER, KAPLAMBRATIO No results found for: IGGSERUM, IGA, IGMSERUM No results found for: Odetta Pink, SPEI   Chemistry      Component Value Date/Time   NA 137 10/18/2020 1027   NA 135 06/16/2019 1333   K 4.5 10/18/2020 1027   CL 98 10/18/2020 1027   CO2 31 10/18/2020 1027   BUN 20 10/18/2020 1027   BUN 31 (H) 06/16/2019 1333   CREATININE 0.95 10/18/2020 1027   CREATININE 0.91 03/16/2016 1539      Component Value Date/Time   CALCIUM 8.9 10/18/2020 1027   ALKPHOS 119  10/18/2020 1027   AST 25 10/18/2020 1027   ALT 14 10/18/2020 1027   BILITOT 0.4 10/18/2020 1027       Impression and Plan: Ms. Flaharty is a very pleasant 75 yo caucasian female with iron deficiency anemia secondary to malabsorption and possible GI blood loss with Eliquis.  Iron studies are pending.  She received her ESA for Hgb 10.4.  Monthly lab and injection with follow-up in 2 months.  She can contact our office with any questions or concerns.    Laverna Peace, NP 9/6/202211:16 AM

## 2020-11-22 NOTE — Telephone Encounter (Signed)
Per 11/22/20 los - called and lvm of upcoming appointments - requested call back to confirm - mailed calendar

## 2020-11-23 ENCOUNTER — Telehealth: Payer: Self-pay | Admitting: Pharmacist

## 2020-11-23 ENCOUNTER — Ambulatory Visit (INDEPENDENT_AMBULATORY_CARE_PROVIDER_SITE_OTHER): Payer: HMO | Admitting: Pharmacist

## 2020-11-23 DIAGNOSIS — I10 Essential (primary) hypertension: Secondary | ICD-10-CM

## 2020-11-23 DIAGNOSIS — M810 Age-related osteoporosis without current pathological fracture: Secondary | ICD-10-CM

## 2020-11-23 DIAGNOSIS — E785 Hyperlipidemia, unspecified: Secondary | ICD-10-CM

## 2020-11-23 DIAGNOSIS — I480 Paroxysmal atrial fibrillation: Secondary | ICD-10-CM

## 2020-11-23 DIAGNOSIS — J439 Emphysema, unspecified: Secondary | ICD-10-CM

## 2020-11-23 LAB — IRON AND TIBC
Iron: 71 ug/dL (ref 41–142)
Saturation Ratios: 32 % (ref 21–57)
TIBC: 224 ug/dL — ABNORMAL LOW (ref 236–444)
UIBC: 153 ug/dL (ref 120–384)

## 2020-11-23 LAB — FERRITIN: Ferritin: 484 ng/mL — ABNORMAL HIGH (ref 11–307)

## 2020-11-23 MED ORDER — ATORVASTATIN CALCIUM 20 MG PO TABS: ORAL_TABLET | ORAL | 1 refills | Status: AC

## 2020-11-23 NOTE — Telephone Encounter (Signed)
Patient with CAD and history of TIA.  Past due to have statin / atorvastatin 20mg  daily refilled.  Per [facility] patient needs new / updated prescription.

## 2020-11-23 NOTE — Telephone Encounter (Signed)
She has not been taking because she ran out but should be taking.

## 2020-11-23 NOTE — Patient Instructions (Signed)
Visit Information  PATIENT GOALS:  Goals Addressed             This Visit's Progress    Chronic Care Management Pharmacy Care Plan   Not on track    [place] (see longitudinal plan of care for additional care plan information)  Current Barriers:  Chronic Disease Management support, education, and care coordination needs related to Afib, COPD, HF, Hypertension, elevated cholesterol, Heart disease, Chronic Pain Syndrome, GERD, Insomnia   Hypertension BP Readings from Last 3 Encounters:  11/22/20 136/73  10/18/20 (!) 139/58  09/20/20 (!) 150/65  Pharmacist Clinical Goal(s): Over the next 90 days, patient will work with PharmD and providers to achieve BP goal <140/90 Current regimen:  Amlodipine 2.5mg  daily each evening Intervention:  Discussed blood pressure goals Patient self care activities - Over the next 90 days, patient will: Maintain hypertension medication regimen.  Check blood pressure at home 2 to 3 times per week, record and bring to office appointments  Atrial Fibrillation:  Pharmacist Clinical Goal(s): Over the next 90 days, patient will work with PharmD and providers to improve access to Eliquis (patient is in Medicare coverage gap but has to spend $675 (3% of household income) on medications per manufacturer assistance program specifications) Current regimen:  Eliquis 5mg  twice a day Intervention:  Faxed updated out of pocket expense to patient assistance program for Eliquis. (Done 09/08/2020) Reminded to reorder Eliquis form patient assistance program  Patient self care activities - Over the next 90 days, patient will: Maintain Eliquis therapy Call if any questions regarding reordering Eliquis from patient assistance program   Hyperlipidemia:   Lab Results  Component Value Date/Time   LDLCALC 54 05/11/2019 10:45 AM  Pharmacist Clinical Goal(s): Over the next 90 days, patient will work with PharmD and providers to maintain LDL goal < 70 Current  regimen:  Atorvastatin 20mg  daily Patient self care activities - Over the next 90 days, patient will: Restart taking atorvastatin 20mg  daily Coordinated new prescription to [facility].   COPD Pharmacist Clinical Goal(s) Over the next 90 days, patient will work with PharmD and providers to reduce symptoms associated with COPD and reduce barrier to receiving medication Current regimen:  Incruse ellipta 62.5mg /inh once daily  Proventil as needed Patient self care activities - Over the next 90 days, patient will: Continue current therapy Call if any questions regarding reordering Eliquis from patient assistance program  Osteoporosis  Pharmacist Clinical Goal(s) Over the next 180 days, patient will work with PharmD and providers to reduce risk of fracture due to osteoporosis  Current regimen:  None Interventions: Reviewed benefits of Prolia, but will defer until patient has completed MAI treatment and DEXA rechecked Patient self care activities - Over the next 180 days, patient will: Consider restarting Prolia pending next bone density results.   Health Maintenance  Pharmacist Clinical Goal(s) Over the next year, patient will work with PharmD and providers to complete health maintenance screenings/vaccinations Interventions: Discussed consideration of completing Shingrix vaccine series (would wait until completion of MAI abx treatment) Discussed consideration of completing COVID Vaccine Series Patient self care activities - Over the next year, patient will: Consider completion of Shingrix vaccine series (after completion of MAI treatment) Consider completion of COVID Vaccine series Consider getting annual flu vaccines  Medication management Pharmacist Clinical Goal(s): Over the next 90 days, patient will work with PharmD and providers to maintain optimal medication adherence Current pharmacy: [facility]  Interventions Comprehensive medication review  performed. Approved for PAP for Incruse and  Eliquis.  Patient self care activities - Over the next 90 days, patient will: Focus on medication adherence by filling and taking medications appropriately  Take medications as prescribed Report any questions or concerns to PharmD and/or provider(s) Bring in financial documentation for Eliquis patient assistance application  Please see past updates related to this goal by clicking on the "Past Updates" button in the selected goal         Patient verbalizes understanding of instructions provided today and agrees to view in [place].   Telephone follow up appointment with care management team member scheduled for: 2 to 3 months  Cherre Robins, PharmD Clinical Pharmacist [facility]

## 2020-11-23 NOTE — Telephone Encounter (Signed)
Refill sent.

## 2020-11-23 NOTE — Chronic Care Management (AMB) (Signed)
Chronic Care Management Pharmacy Note  11/23/2020 Name:  [name] MRN:  [id number] DOB:  [birth year]  Subjective: [name] is an 75 y.o. year old female who is a primary patient of Ann Held, DO.  The CCM team was consulted for assistance with disease management and care coordination needs.    Engaged with patient by telephone for  follow up patient assistance applications and follow up chronic conditions  in response to provider referral for pharmacy case management and/or care coordination services.   Consent to Services:  The patient was given information about Chronic Care Management services, agreed to services, and gave verbal consent prior to initiation of services.  Please see initial visit note for detailed documentation.   Patient Care Team: Carollee Herter, Alferd Apa, DO as PCP - General Minus Breeding, MD as PCP - Cardiology (Cardiology) Deboraha Sprang, MD as PCP - Electrophysiology (Cardiology) Rolm Bookbinder, MD as Consulting Physician (General Surgery) Lafayette Dragon, MD (Inactive) as Consulting Physician (Gastroenterology) Gatha Mayer, MD as Consulting Physician (Gastroenterology) Rutherford Guys, MD as Consulting Physician (Ophthalmology) Crista Luria, MD as Consulting Physician (Dermatology) Deboraha Sprang, MD as Consulting Physician (Cardiology) Minus Breeding, MD as Consulting Physician (Cardiology) Cherre Robins, PharmD (Pharmacist)  Recent office visits: 07/22/2020 - PCP (Dr Etter Sjogren) - video visit to f/u fibromyalgia. No medication changes  Recent consult visits: 11/22/2020 - Hematology - Received IV iron infusion and EPO 10/18/2020 - EPO administration and Port A Cath flush 09/28/20- ID (Dr Baxter Flattery)  follow up of pulmonary mycobacteriu avium-intracellulare infection. Continue observation for bronchiectasis/pulmonary MAC. Started nitrofurantoin 100 mg daily, follow up 4 months 08/24/2020 - ID (Dr Baxter Flattery)  F/U MAC infections. Patient  noted weight loss and increased croupy cough. Noted bronchiectasis flare. Prescribed Amoxicillin 522m 3 times a day for 7 days; Stopped MAC antibiotics to give a break to see if can increase her weight; Recommended Ensure or protein drink to increase weight.  08/17/2020 - Hematology. Patient was given retacrit today per 5/4 lab work HGB at 10.9 07/20/2020 - Hematology ([facility] NP) f/u iron deficiency anemia and erythropoietin deficient anemia. She received ESA today for Hgb 10.9.  Iron studies are pending. We will replace if needed.  Lab and injection every 4 weeks with follow-up in 8 weeks.    Hospital visits: None in previous 6 months  Objective:  Lab Results  Component Value Date   CREATININE 0.84 11/22/2020   CREATININE 0.95 10/18/2020   CREATININE 0.84 09/20/2020    Lab Results  Component Value Date   HGBA1C 5.2 05/31/2017   Last diabetic Eye exam: No results found for: HMDIABEYEEXA  Last diabetic Foot exam: No results found for: HMDIABFOOTEX      Component Value Date/Time   CHOL 102 05/11/2019 1045   TRIG 97 05/11/2019 1045   TRIG 76 03/11/2006 1059   HDL 29 (L) 05/11/2019 1045   CHOLHDL 3.5 05/11/2019 1045   VLDL 19 05/11/2019 1045   LDLCALC 54 05/11/2019 1045    Hepatic Function Latest Ref Rng & Units 11/22/2020 10/18/2020 09/20/2020  Total Protein 6.5 - 8.1 g/dL 6.3(L) 7.0 7.2  Albumin 3.5 - 5.0 g/dL 3.9 4.2 4.3  AST 15 - 41 U/L _0 ALT 0 - 44 U/L _1 Alk Phosphatase 38 - 126 U/L 117 119 186(H)  Total Bilirubin 0.3 - 1.2 mg/dL 0.4 0.4 0.4  Bilirubin, Direct 0.0 - 0.3 mg/dL - - -    Lab  Results  Component Value Date/Time   TSH 7.708 (H) 07/20/2019 08:05 PM   TSH 4.43 06/26/2010 01:55 PM   TSH 1.05 02/15/2009 10:23 AM   FREET4 0.68 07/20/2019 08:30 PM    CBC Latest Ref Rng & Units 11/22/2020 10/18/2020 09/20/2020  WBC 4.0 - 10.5 K/uL 5.5 6.6 4.7  Hemoglobin 12.0 - 15.0 g/dL 10.4(L) 10.0(L) 11.3(L)  Hematocrit 36.0 - 46.0 % 32.9(L) 31.1(L) 35.3(L)   Platelets 150 - 400 K/uL 212 225 205    Lab Results  Component Value Date/Time   VD25OH 16.46 (L) 09/06/2015 02:16 PM   VD25OH 4.8 (L) 05/20/2014 03:33 PM   VD25OH 15 (L) 12/12/2011 06:45 PM    Clinical ASCVD: Yes  The ASCVD Risk score Mikey Bussing DC Jr., et al., 2013) failed to calculate for the following reasons:   The valid total cholesterol range is 130 to 320 mg/dL    Social History   Tobacco Use  Smoking Status Former   Packs/day: 1.00   Years: 20.00   Pack years: 20.00   Types: Cigarettes   Quit date: 03/19/1986   Years since quitting: 34.7  Smokeless Tobacco Never   BP Readings from Last 3 Encounters:  11/22/20 136/73  10/18/20 (!) 139/58  09/20/20 (!) 150/65   Pulse Readings from Last 3 Encounters:  11/22/20 82  10/18/20 71  09/20/20 74   Wt Readings from Last 3 Encounters:  11/22/20 90 lb (40.8 kg)  09/28/20 85 lb (38.6 kg)  09/20/20 83 lb (37.6 kg)    Assessment: Review of patient past medical history, allergies, medications, health status, including review of consultants reports, laboratory and other test data, was performed as part of comprehensive evaluation and provision of chronic care management services.   SDOH:  (Social Determinants of Health) assessments and interventions performed:     CCM Care Plan  Allergies  Allergen Reactions   Dabigatran Etexilate Mesylate Other (See Comments)    INTERNAL BLEEDING- Pradaxa   Augmentin [Amoxicillin-Pot Clavulanate] Diarrhea and Nausea Only    Did it involve swelling of the face/tongue/throat, SOB, or low BP? No Did it involve sudden or severe rash/hives, skin peeling, or any reaction on the inside of your mouth or nose? No Did you need to seek medical attention at a hospital or doctor's office? No When did it last happen? 02/2019 If all above answers are "NO", may proceed with cephalosporin use.    Talwin [Pentazocine] Other (See Comments)    Hallucinations     Medications Reviewed Today      Reviewed by Cherre Robins, PharmD (Pharmacist) on 11/23/20 at 1313  Med List Status: <None>   Medication Order Taking? Sig Documenting Provider Last Dose Status Informant  acetaminophen (TYLENOL) 650 MG CR tablet [id number] Yes Take 650 mg by mouth every 8 (eight) hours as needed for pain. [provider] Taking Active Spouse/Significant Other  albuterol (VENTOLIN HFA) 108 (90 Base) MCG/ACT inhaler [id number] Yes Inhale 2 puffs into the lungs every 6 (six) hours as needed for wheezing or shortness of breath. [provider] Taking Active Spouse/Significant Other           Med Note Antony Contras, Marcellous Snarski B   Mon Jun 20, 2020 11:19 AM) No longer getting from PAP - now available generically  amLODipine (NORVASC) 2.5 MG tablet [id number] Yes TAKE 1 TABLET BY MOUTH AT BEDTIME Minus Breeding, MD Taking Active   apixaban (ELIQUIS) 5 MG TABS tablet [id number] Yes Take 1 tablet (5 mg total) by mouth 2 (two) times  daily. Ann Held, DO Taking Active            Med Note Antony Contras, [place] Sep 27, 2020 10:56 AM) Approved to received thru [place] PAP from 09/08/2020 thru 03/18/2021  atorvastatin (LIPITOR) 20 MG tablet [id number] Yes TAKE 1 TABLET BY MOUTH ONCE DAILY AT  6PM  Patient taking differently: TAKE 1 TABLET BY MOUTH ONCE DAILY AT  [address], DO Taking Active   furosemide (LASIX) 40 MG tablet [id number] Yes TAKE 1 TABLET BY MOUTH ONCE DAILY IN THE [name], MD Taking Active   gabapentin (NEURONTIN) 600 MG tablet [id number] Yes Take 1 tablet (600 mg total) by mouth in the morning, at noon, in the evening, and at bedtime. Ann Held, DO Taking Active     Discontinued 06/05/11 1433 (Error) lidocaine-prilocaine (EMLA) cream [id number] Yes Apply 1 application topically as needed.  Patient taking differently: Apply 1 application topically as needed (for port access- to numb).   Volanda Napoleon, MD Taking Active   nitrofurantoin,  macrocrystal-monohydrate, (MACROBID) 100 MG capsule [id number] Yes Take 1 capsule (100 mg total) by mouth daily. Carlyle Basques, MD Taking Active   nitroGLYCERIN (NITROSTAT) 0.4 MG SL tablet [id number] Yes Place 1 tablet (0.4 mg total) under the tongue every 5 (five) minutes as needed. CHEST PAIN Minus Breeding, MD Taking Active Spouse/Significant Other  oxyCODONE-acetaminophen (PERCOCET) 10-325 MG tablet [id number] Yes Take 1 tablet by mouth every 4 (four) hours as needed. PAIN Ann Held, DO Taking Active   OXYGEN [id number] Yes Inhale 3 L/min into the lungs continuous.  [provider] Taking Active Spouse/Significant Other  pantoprazole (PROTONIX) 40 MG tablet [id number] Yes Take 1 tablet by mouth twice daily Gatha Mayer, MD Taking Active   tiZANidine (ZANAFLEX) 4 MG tablet [id number] Yes Take 1 tablet by mouth three times daily as needed for muscle spasm Carollee Herter, Alferd Apa, DO Taking Active   triamcinolone ointment (KENALOG) 0.1 % [id number] Yes Apply 1 application topically daily as needed (to affected areas- for psoriasis).  [provider] Taking Active Spouse/Significant Other  umeclidinium bromide (INCRUSE ELLIPTA) 62.5 MCG/INH AEPB [id number] Yes Inhale 1 puff into the lungs daily. Ann Held, DO Taking Active            Med Note Antony Contras, Shann Merrick B   Tue Jul 26, 2020 11:11 AM) Approved to received [employer] patient assistance 07/13/2020 to 03/18/2021  zolpidem (AMBIEN) 5 MG tablet [id number] Yes 1 po qhs prn Ann Held, DO Taking Active             Patient Active Problem List   Diagnosis Date Noted   Hematoma 08/07/2019   Other neutropenia ([place])    Post-procedural fever 07/20/2019   Acute febrile illness 07/16/2019   Pulmonary MAI (mycobacterium avium-intracellulare) infection ([place]) 06/24/2019   History of total right hip replacement 05/27/2019   NICM (nonischemic cardiomyopathy) ([place]) 05/19/2019   Educated about COVID-19 virus  infection 09/07/2018   Erythropoietin deficiency anemia 06/19/2018   Swelling of calf 02/04/2018   Bad odor of urine 12/09/2017   Iron deficiency anemia due to chronic blood loss 08/14/2017   Iron malabsorption 08/14/2017   Hyperlipidemia LDL goal <100 07/08/2017   Elevated ALT measurement    Hypotension 06/20/2017   HLD (hyperlipidemia) 06/20/2017   CAD (coronary artery disease) 06/20/2017   Protein-calorie malnutrition, severe ([place]) 06/20/2017   Generalized weakness  Weakness 05/30/2017   Focal neurological deficit 05/30/2017   Chronic diastolic CHF (congestive heart failure) ([place]) 05/30/2017   TIA (transient ischemic attack) 05/30/2017   AKI (acute kidney injury) ([place]) 05/30/2017   Abnormal LFTs 05/30/2017   Bronchiectasis ([place]) 04/22/2017   Right otitis media 07/17/2016   Cerumen impaction 07/17/2016   Osteoporosis 03/16/2016   Headache 11/20/2014   Skin infection 05/27/2014   Insomnia 05/27/2014   Abdominal wall pain 07/09/2013   Other dysphagia 02/02/2013   Platelets decreased ([place]) 01/29/2012   Vitamin D deficiency 12/13/2011   Hypokalemia 12/13/2011   Hypocalcemia 12/12/2011   Adhesions of intestine, very dense, with frozen abdomen 12/11/2011   SBO (small bowel obstruction), recurrent 12/11/2011   Protein-calorie malnutrition, moderate (HCC) 12/11/2011   Oxygen dependent    Hx of gastric ulcer    UTI (urinary tract infection) 10/15/2011   Allergic conjunctivitis 10/15/2011   Degenerative arthritis of hip 08/03/2011   B12 deficiency 07/02/2011   Bilateral leg weakness 01/16/2011   Anemia, chronic disease 09/14/2010   Recurrent pneumonia 08/11/2010   CRT- [employer] 06/26/2010   DYSURIA 05/19/2010   Chronic hypoxemic respiratory failure ([place]) 02/21/2010   DEHYDRATION 01/06/2010   GASTROENTERITIS 01/06/2010   Congestive dilated cardiomyopathy ([place]) 11/17/2009   LBBB 97/35/3299   SYSTOLIC HEART FAILURE, CHRONIC 11/17/2009   GERD (gastroesophageal reflux  disease) 02/17/2009   NONSPECIFIC ABN FINDNG RAD&OTH EXAM BILARY TRCT 09/03/2008   COPD with emphysema ([place]) 06/28/2008   DIVERTICULOSIS, COLON 06/03/2008   GASTRITIS 06/02/2008   Atrial fibrillation/flutter 12/08/2007   GASTROPARESIS 08/07/2007   NAUSEA, CHRONIC 08/07/2007   Abdominal pain 08/07/2007   SYNCOPE 02/17/2007   RESTLESS LEG SYNDROME 08/01/2006   Chronic pain syndrome 08/01/2006   OSTEOARTHRITIS 07/31/2006   Degenerative disc disease, lumbar 07/31/2006   Fibromyalgia 07/31/2006   CHRONIC FATIGUE SYNDROME 07/31/2006   URINARY INCONTINENCE 07/31/2006   ANEMIA-IRON DEFICIENCY 05/01/2006   Essential hypertension 05/01/2006    Immunization History  Administered Date(s) Administered   DTaP 11/03/1996, 01/04/1997, 05/03/1997, 10/19/1997, 08/05/2001   Fluad Quad(high Dose 65+) 11/06/2018, 12/31/2019   HPV Quadrivalent 09/05/2009, 09/13/2010, 09/26/2011   Hepatitis A, Ped/Adol-2 Dose 09/21/2004, 03/29/2005   Hepatitis B, ped/adol 04/30/1996, 11/03/1996, 10/19/1997   HiB (PRP-T) 11/03/1996, 01/04/1997, 05/03/1997, 10/19/1997   IPV 11/03/1996, 01/04/1997, 05/03/1997, 08/05/2001   Influenza Split 01/16/2011, 12/12/2011   Influenza Whole 01/15/2007, 12/08/2007, 12/28/2008, 12/17/2009   Influenza, High Dose Seasonal PF 11/28/2015, 12/07/2016, 11/15/2017   Influenza, Seasonal, Injecte, Preservative Fre 12/09/2012   Influenza,Quad,Nasal, Live 03/17/2007, 03/06/2009, 01/08/2010, 01/30/2011, 02/09/2012, 01/18/2013   Influenza,inj,Quad PF,6+ Mos 03/29/2005, 04/30/2005, 03/01/2006, 01/28/2008, 12/01/2013, 01/16/2014, 12/22/2014, 12/28/2014   MMR 05/03/1997, 08/05/2001   Meningococcal B, OMV 12/22/2014   Pneumococcal Conjugate-13 06/10/2014, 11/15/2014   Pneumococcal Polysaccharide-23 12/17/2009   Tdap 09/17/2007, 05/15/2014, 12/09/2018   Varicella 11/16/1997, 05/18/2008   Zoster, Live 12/31/2013    Conditions to be addressed/monitored: CHF, HTN, HLD, COPD, Pulmonary Disease and  GERD, insomnia; chronic pain; CAD  Care Plan : General Pharmacy (Adult)  Updates made by Cherre Robins, PHARMD since 11/23/2020 12:00 AM     Problem: Afib, COPD, HF, HTN, HLD, CAD, Chronic Pain Syndrome, GERD, Insomnia   Priority: High  Onset Date: 05/10/2020     Long-Range Goal: Patient-Specific Goal   Start Date: 05/10/2020  Expected End Date: 09/23/2020  Recent Progress: On track  Priority: High  Note:   Current Barriers:  Unable to independently afford treatment regimen Chronic Disease Management support, education, and care coordination needs related to Afib, COPD, HF,  HTN, HLD, CAD, Chronic Pain Syndrome, GERD, Insomnia Not taking statin with history of CAD and TIA  Pharmacist Clinical Goal(s):  Over the next 90 days, patient will verbalize ability to afford treatment regimen achieve adherence to monitoring guidelines and medication adherence to achieve therapeutic efficacy contact provider office for questions/concerns as evidenced notation of same in electronic health record through collaboration with PharmD and provider.   Interventions: 1:1 collaboration with Carollee Herter, Alferd Apa, DO regarding development and update of comprehensive plan of care as evidenced by provider attestation and co-signature Inter-disciplinary care team collaboration (see longitudinal plan of care) Comprehensive medication review performed; medication list updated in electronic medical record  Hypertension (BP goal <140/90) Controlled Current treatment: Amlodipine 2.84m daily in the evening Medications previously tried: none noted  Current home readings: checks infrequentl but BP yesterday was 136/73 Pharmacist interventions:  Educated on BP goals and benefits of medications for prevention of heart attack, stroke and kidney damage; Reminded about the importance of home blood pressure monitoring; Recommended to continue current medication.    Hyperlipidemia / CAD / history of TIA:  Not currently  taking statin LDL goal <70 but last lipid panel checked 04/2019 when patient was still taking statin Prescribed treatment:  Atorvastatin 260mdaily (has not been filled in 2022 yet)  Interventions:  Discussed benefits of statin therapy in prevention of stroke and heart attack Coordinated updated prescription to be sent to pharmacy Consider rechecking lipid panel with next labs.   Atrial Fibrillation (Goal: prevent stroke and major bleeding) Controlled  Current treatment: Anticoagulation: Eliquis 17m52mwice daily (per Dr. LowEtter Sjogrenedications previously tried: amiodarone, carvedilol Patient is in Medicare coverage gap. She is gettign Eliquis from patient assistance program through 03/18/2021 Intervention:  Maintain Eliquis therapy Patient has met 3% OOP spend require for [place] patient assistance program for Eliquis and will continue to receive Eliquis form PAP through 03/18/2021 Confirmed with patient received first shipment 09/16/2020 - reminded her to order next shipment now.   COPD with past MAI infection (Goal: control symptoms and prevent exacerbations) Controlled Completed antibiotics for MAI infection in April 2022.  Current treatment  Incruse ellipta 62.17mg76mh once daily (per Sood) Proventil (per SoodOrangevilledications previously tried: none noted  Gold Grade: Gold 2 (FEV1 50-79%) Current COPD Classification:  B (high sx, <2 exacerbations/yr) Pulmonary function testing: 10/10/2011-pulmonary function test-FVC 1.47 (54% predicted), postbronchodilator ratio 64, postbronchodilator FEV1 1.15 (59% predicted), positive bronchodilator response, DLCO 5.3 (35% predicted) Exacerbations requiring treatment in last 6 months: none Patient reports consistent use of maintenance inhaler Frequency of rescue inhaler use: rare - only about 1 or 2 times per month now Pharmacist Interventions:  Recommended continue current inhalers for COPD PAP application for Incruse has been approved. Patient received first  shipment in May 2022.    Patient is taking nitrofurantoin daily for UTI prevention. Consider risk versus benefits in patient with COPD given that nitrofurantoin is associated with pulmonary injury.   Pain (Goal: Minimize symptoms) Controlled Current treatment  Gabapentin 600mg52mimes daily Oxycodone 10-3217mg 42mmes daily Tizanidine 4mg TI717monly takes BID) Acetaminophen 3217mg - 74m 2 tabs PRN - uses every other day Medications previously tried: none noted Pharmacist interventions:  Recommended to continue current medication  Osteoporosis  Goal: reduce risk of fracture due to osteoporosis  Current regimen:  None Interventions: Reviewed benefits of Prolia, but will defer until patient has completed MAI treatment and DEXA rechecked   Health Maintenance  Interventions: Discussed consideration of completing Shingrix vaccine series (would  wait until completion of MAI abx treatment) Discussed consideration of completing COVID Vaccine Series Patient self care activities - Over the next year, patient will: Consider completion of Shingrix vaccine series (after completion of MAI treatment) Consider completion of COVID Vaccine series Reminded to get annual flu vaccine  Patient Goals/Self-Care Activities Over the next 90 days, patient will:  take medications as prescribed check blood pressure periodically, document, and provide at future appointments collaborate with provider on medication access solutions  Follow Up Plan: The care management team will reach out to the patient again over the next 90 days.        Medication Assistance: Approved for assistance for Incruse thru [place] assistance program- received first delivery 07/2020;   Approved for assistance for Eliquis thru [employer] assistance program - received first delivery 09/16/2020.  Patient's preferred pharmacy is:  [facility], [address] [place]  [address] Phone: [phone] Fax: [phone]  Follow Up:  Patient agrees to Care Plan and Follow-up.  Plan: Telephone follow up appointment with care management team member scheduled for:  2 to 3 months  Cherre Robins, PharmD Clinical Pharmacist [place] [phone]

## 2020-11-29 ENCOUNTER — Ambulatory Visit (INDEPENDENT_AMBULATORY_CARE_PROVIDER_SITE_OTHER): Payer: HMO

## 2020-11-29 VITALS — Ht 60.0 in | Wt 89.0 lb

## 2020-11-29 DIAGNOSIS — Z780 Asymptomatic menopausal state: Secondary | ICD-10-CM | POA: Diagnosis not present

## 2020-11-29 DIAGNOSIS — Z0000 Encounter for general adult medical examination without abnormal findings: Secondary | ICD-10-CM | POA: Diagnosis not present

## 2020-11-29 NOTE — Patient Instructions (Signed)
[name] , Thank you for taking time to complete your Medicare Wellness Visit. I appreciate your ongoing commitment to your health goals. Please review the following plan we discussed and let me know if I can assist you in the future.   Screening recommendations/referrals: Colonoscopy: No longer required Mammogram: Declined today. Bone Density: Orered today. Someone will call you to schedule. Recommended yearly ophthalmology/optometry visit for glaucoma screening and checkup Recommended yearly dental visit for hygiene and checkup  Vaccinations: Influenza vaccine: Due-May obtain vaccine at our office or your local pharmacy. Pneumococcal vaccine: Up to date Tdap vaccine: Up to date-Due-12/08/2028 Shingles vaccine: Discuss with pharmacy   Covid-19:Declined  Advanced directives: Please bring a copy for your chart  Conditions/risks identified: See problem list  Next appointment: Follow up in one year for your annual wellness visit    Preventive Care 76 Years and Older, Female Preventive care refers to lifestyle choices and visits with your health care provider that can promote health and wellness. What does preventive care include? A yearly physical exam. This is also called an annual well check. Dental exams once or twice a year. Routine eye exams. Ask your health care provider how often you should have your eyes checked. Personal lifestyle choices, including: Daily care of your teeth and gums. Regular physical activity. Eating a healthy diet. Avoiding tobacco and drug use. Limiting alcohol use. Practicing safe sex. Taking low-dose aspirin every day. Taking vitamin and mineral supplements as recommended by your health care provider. What happens during an annual well check? The services and screenings done by your health care provider during your annual well check will depend on your age, overall health, lifestyle risk factors, and family history of disease. Counseling  Your  health care provider may ask you questions about your: Alcohol use. Tobacco use. Drug use. Emotional well-being. Home and relationship well-being. Sexual activity. Eating habits. History of falls. Memory and ability to understand (cognition). Work and work Statistician. Reproductive health. Screening  You may have the following tests or measurements: Height, weight, and BMI. Blood pressure. Lipid and cholesterol levels. These may be checked every 5 years, or more frequently if you are over 13 years old. Skin check. Lung cancer screening. You may have this screening every year starting at age 62 if you have a 30-pack-year history of smoking and currently smoke or have quit within the past 15 years. Fecal occult blood test (FOBT) of the stool. You may have this test every year starting at age 41. Flexible sigmoidoscopy or colonoscopy. You may have a sigmoidoscopy every 5 years or a colonoscopy every 10 years starting at age 40. Hepatitis C blood test. Hepatitis B blood test. Sexually transmitted disease (STD) testing. Diabetes screening. This is done by checking your blood sugar (glucose) after you have not eaten for a while (fasting). You may have this done every 1-3 years. Bone density scan. This is done to screen for osteoporosis. You may have this done starting at age 4. Mammogram. This may be done every 1-2 years. Talk to your health care provider about how often you should have regular mammograms. Talk with your health care provider about your test results, treatment options, and if necessary, the need for more tests. Vaccines  Your health care provider may recommend certain vaccines, such as: Influenza vaccine. This is recommended every year. Tetanus, diphtheria, and acellular pertussis (Tdap, Td) vaccine. You may need a Td booster every 10 years. Zoster vaccine. You may need this after age 76. Pneumococcal 13-valent conjugate (  PCV13) vaccine. One dose is recommended after age  76. Pneumococcal polysaccharide (PPSV23) vaccine. One dose is recommended after age 76. Talk to your health care provider about which screenings and vaccines you need and how often you need them. This information is not intended to replace advice given to you by your health care provider. Make sure you discuss any questions you have with your health care provider. Document Released: 04/01/2015 Document Revised: 11/23/2015 Document Reviewed: 01/04/2015 Elsevier Interactive Patient Education  2017 [name] Prevention in the Home Falls can cause injuries. They can happen to people of all ages. There are many things you can do to make your home safe and to help prevent falls. What can I do on the outside of my home? Regularly fix the edges of walkways and driveways and fix any cracks. Remove anything that might make you trip as you walk through a door, such as a raised step or threshold. Trim any bushes or trees on the path to your home. Use bright outdoor lighting. Clear any walking paths of anything that might make someone trip, such as rocks or tools. Regularly check to see if handrails are loose or broken. Make sure that both sides of any steps have handrails. Any raised decks and porches should have guardrails on the edges. Have any leaves, snow, or ice cleared regularly. Use sand or salt on walking paths during winter. Clean up any spills in your garage right away. This includes oil or grease spills. What can I do in the bathroom? Use night lights. Install grab bars by the toilet and in the tub and shower. Do not use towel bars as grab bars. Use non-skid mats or decals in the tub or shower. If you need to sit down in the shower, use a plastic, non-slip stool. Keep the floor dry. Clean up any water that spills on the floor as soon as it happens. Remove soap buildup in the tub or shower regularly. Attach bath mats securely with double-sided non-slip rug tape. Do not have throw  rugs and other things on the floor that can make you trip. What can I do in the bedroom? Use night lights. Make sure that you have a light by your bed that is easy to reach. Do not use any sheets or blankets that are too big for your bed. They should not hang down onto the floor. Have a firm chair that has side arms. You can use this for support while you get dressed. Do not have throw rugs and other things on the floor that can make you trip. What can I do in the kitchen? Clean up any spills right away. Avoid walking on wet floors. Keep items that you use a lot in easy-to-reach places. If you need to reach something above you, use a strong step stool that has a grab bar. Keep electrical cords out of the way. Do not use floor polish or wax that makes floors slippery. If you must use wax, use non-skid floor wax. Do not have throw rugs and other things on the floor that can make you trip. What can I do with my stairs? Do not leave any items on the stairs. Make sure that there are handrails on both sides of the stairs and use them. Fix handrails that are broken or loose. Make sure that handrails are as long as the stairways. Check any carpeting to make sure that it is firmly attached to the stairs. Fix any carpet that is  loose or worn. Avoid having throw rugs at the top or bottom of the stairs. If you do have throw rugs, attach them to the floor with carpet tape. Make sure that you have a light switch at the top of the stairs and the bottom of the stairs. If you do not have them, ask someone to add them for you. What else can I do to help prevent falls? Wear shoes that: Do not have high heels. Have rubber bottoms. Are comfortable and fit you well. Are closed at the toe. Do not wear sandals. If you use a stepladder: Make sure that it is fully opened. Do not climb a closed stepladder. Make sure that both sides of the stepladder are locked into place. Ask someone to hold it for you, if  possible. Clearly mark and make sure that you can see: Any grab bars or handrails. First and last steps. Where the edge of each step is. Use tools that help you move around (mobility aids) if they are needed. These include: Canes. Walkers. Scooters. Crutches. Turn on the lights when you go into a dark area. Replace any light bulbs as soon as they burn out. Set up your furniture so you have a clear path. Avoid moving your furniture around. If any of your floors are uneven, fix them. If there are any pets around you, be aware of where they are. Review your medicines with your doctor. Some medicines can make you feel dizzy. This can increase your chance of falling. Ask your doctor what other things that you can do to help prevent falls. This information is not intended to replace advice given to you by your health care provider. Make sure you discuss any questions you have with your health care provider. Document Released: 12/30/2008 Document Revised: 08/11/2015 Document Reviewed: 04/09/2014 Elsevier Interactive Patient Education  2017 [employer].

## 2020-11-29 NOTE — Progress Notes (Signed)
Subjective:   [name] is a 75 y.o. female who presents for Medicare Annual (Subsequent) preventive examination.  I connected with Cing today by telephone and verified that I am speaking with the correct person using two identifiers. Location patient: home Location provider: work Persons participating in the virtual visit: patient, Marine scientist.    I discussed the limitations, risks, security and privacy concerns of performing an evaluation and management service by telephone and the availability of in person appointments. I also discussed with the patient that there may be a patient responsible charge related to this service. The patient expressed understanding and verbally consented to this telephonic visit.    Interactive audio and video telecommunications were attempted between this provider and patient, however failed, due to patient having technical difficulties OR patient did not have access to video capability.  We continued and completed visit with audio only.  Some vital signs may be absent or patient reported.   Time Spent with patient on telephone encounter: 20 minutes   Review of Systems     Cardiac Risk Factors include: advanced age (>24mn, >>57women);hypertension;dyslipidemia     Objective:    Today's Vitals   11/29/20 1420 11/29/20 1421  Weight: 89 lb (40.4 kg)   Height: 5' (1.524 m)   PainSc:  3    Body mass index is 17.38 kg/m.  Advanced Directives 11/29/2020 11/22/2020 09/20/2020 07/20/2020 05/09/2020 02/08/2020 12/22/2019  Does Patient Have a Medical Advance Directive? _0  Yes Yes  Type of AParamedicof AEdenLiving will [place]ansingLiving will Living will;Healthcare Power of Attorney Living will;Healthcare Power of Attorney Living will;Healthcare Power of Attorney Living will;Healthcare Power of AWinthropLiving will  Does patient want to make changes to medical advance  directive? - No - Patient declined No - Patient declined - - - No - Patient declined  Copy of [place] Chart? No - copy requested No - copy requested - No - copy requested No - copy requested No - copy requested No - copy requested  Would patient like information on creating a medical advance directive? - No - Patient declined - No - Patient declined - - No - Patient declined  Pre-existing out of facility DNR order (yellow form or pink MOST form) - - - - - - -    Current Medications (verified) Outpatient Encounter Medications as of 11/29/2020  Medication Sig   acetaminophen (TYLENOL) 650 MG CR tablet Take 650 mg by mouth every 8 (eight) hours as needed for pain.   albuterol (VENTOLIN HFA) 108 (90 Base) MCG/ACT inhaler Inhale 2 puffs into the lungs every 6 (six) hours as needed for wheezing or shortness of breath.   amLODipine (NORVASC) 2.5 MG tablet TAKE 1 TABLET BY MOUTH AT BEDTIME   apixaban (ELIQUIS) 5 MG TABS tablet Take 1 tablet (5 mg total) by mouth 2 (two) times daily.   atorvastatin (LIPITOR) 20 MG tablet TAKE 1 TABLET BY MOUTH ONCE DAILY AT  6PM   furosemide (LASIX) 40 MG tablet TAKE 1 TABLET BY MOUTH ONCE DAILY IN THE MORNING   gabapentin (NEURONTIN) 600 MG tablet Take 1 tablet (600 mg total) by mouth in the morning, at noon, in the evening, and at bedtime.   lidocaine-prilocaine (EMLA) cream Apply 1 application topically as needed. (Patient taking differently: Apply 1 application topically as needed (for port access- to numb).)   nitrofurantoin, macrocrystal-monohydrate, (MACROBID) 100 MG capsule Take  1 capsule (100 mg total) by mouth daily.   nitroGLYCERIN (NITROSTAT) 0.4 MG SL tablet Place 1 tablet (0.4 mg total) under the tongue every 5 (five) minutes as needed. CHEST PAIN   oxyCODONE-acetaminophen (PERCOCET) 10-325 MG tablet Take 1 tablet by mouth every 4 (four) hours as needed. PAIN   OXYGEN Inhale 3 L/min into the lungs continuous.    pantoprazole  (PROTONIX) 40 MG tablet Take 1 tablet by mouth twice daily   tiZANidine (ZANAFLEX) 4 MG tablet Take 1 tablet by mouth three times daily as needed for muscle spasm   triamcinolone ointment (KENALOG) 0.1 % Apply 1 application topically daily as needed (to affected areas- for psoriasis).    umeclidinium bromide (INCRUSE ELLIPTA) 62.5 MCG/INH AEPB Inhale 1 puff into the lungs daily.   zolpidem (AMBIEN) 5 MG tablet 1 po qhs prn   [DISCONTINUED] iron dextran complex (INFED) 50 MG/ML injection Please give infed infusion (no test dose needed) over 4 hours per pharmacy calculated dose. Ht: 5'2 Wt: 99 pounds Hgb: 12   No facility-administered encounter medications on file as of 11/29/2020.    Allergies (verified) Dabigatran etexilate mesylate, Augmentin [amoxicillin-pot clavulanate], and Talwin [pentazocine]   History: Past Medical History:  Diagnosis Date   Anemia    Arthritis    Atrial fibrillation ([place])    CAD (coronary artery disease)    Mild disease per cath in 2011   COPD (chronic obstructive pulmonary disease) ([place])    Home O2   Diverticulitis    Elbow fracture, left aug 2012   Erythropoietin deficiency anemia 06/19/2018   Essential hypertension    Fibromyalgia    Gastroparesis    GERD (gastroesophageal reflux disease)    H/O: GI bleed    from Pradaxa   Hepatitis    Hx of in high school    History of pneumonia    Recurrent   Hx MRSA infection    Hx of gastric ulcer    Hx of stress fx 10/2009   Right hip    Iron malabsorption 08/14/2017   LBBB (left bundle branch block)    Oxygen dependent    2 liters via nasal cannula at all times   Pacemaker    CRT therapy; followed by Dr. Caryl Comes   Pleural effusion     s/p right thoracentesis 03/09   Primary dilated cardiomyopathy ([place])    EF 45 to 50% per echo in Jan 2012   RLS (restless legs syndrome)    Silent aspiration    Small bowel obstruction ([facility])    Urinary incontinence    Venous embolism and thrombosis of subclavian vein  ([place])    After pacemaker insertion Oct 2011   Vitamin B12 deficiency    Wears glasses    Reading   Past Surgical History:  Procedure Laterality Date   APPENDECTOMY     BIV PACEMAKER GENERATOR CHANGEOUT N/A 06/19/2019   Procedure: BIV PACEMAKER GENERATOR CHANGEOUT;  Surgeon: Deboraha Sprang, MD;  Location: [facility];  Service: Cardiovascular;  Laterality: N/A;   BRONCHIAL WASHINGS  05/25/2019   Procedure: BRONCHIAL WASHINGS;  Surgeon: Chesley Mires, MD;  Location: [facility];  Service: Endoscopy;;   CARDIAC CATHETERIZATION  04/08/2006, 11/16/2009   CHOLECYSTECTOMY N/A 06/27/2017   Procedure: OPEN CHOLECYSTECTOMY;  Surgeon: Ralene Ok, MD;  Location: [facility];  Service: General;  Laterality: N/A;   COLECTOMY     Intestinal resection (5times)   COLECTOMY  02/04/2000   ex. lap., intra-abd. subtotal colectomy with ileosigmoid colon  anastomosies and lysis of adhesions   CYSTOSCOPY  11/11/2008   CYSTOSCOPY W/ RETROGRADES  11/11/2008   right   CYSTOSCOPY WITH INJECTION  04/30/2006   transurethral collagen injection; incision vaginal stenosis   ERCP N/A 06/23/2017   Procedure: ENDOSCOPIC RETROGRADE CHOLANGIOPANCREATOGRAPHY (ERCP);  Surgeon: Carol Ada, MD;  Location: [facility];  Service: Endoscopy;  Laterality: N/A;   ERCP N/A 06/24/2017   Procedure: ENDOSCOPIC RETROGRADE CHOLANGIOPANCREATOGRAPHY (ERCP);  Surgeon: [name], MD;  [facility];  Service: Endoscopy;  Laterality: N/A;   ESOPHAGOGASTRODUODENOSCOPY N/A 02/02/2013   Procedure: ESOPHAGOGASTRODUODENOSCOPY (EGD);  Surgeon: Lafayette Dragon, MD;  Location: [facility];  Service: Endoscopy;  Laterality: N/A;   EXPLORATORY LAPAROTOMY  04/27/2009   lysis of adhesions, gastrostomy tube   GASTROCUTANEOUS FISTULA CLOSURE     INTERSTIM IMPLANT PLACEMENT  05/28/2006 - stage I   06/05/2006 - stage II   INTERSTIM IMPLANT PLACEMENT  02/05/2012   Procedure: [name] IMPLANT FIRST STAGE;  Surgeon: Reece Packer, MD;   [facility];  Service: Urology;  Laterality: N/A;  Replacement of Interstim Lead      INTERSTIM IMPLANT REVISION  03/06/2011   Procedure: REVISION OF [name];  Surgeon: Reece Packer, MD;  [facility]: [facility];  Service: Urology;  Laterality: N/A;  Replacement of Neurostimulator   INTERSTIM IMPLANT REVISION  10/23/2007   IR IMAGING GUIDED PORT INSERTION  10/14/2017   PACEMAKER PLACEMENT  12/21/2009   Peg removed     with complications   PORT-A-CATH REMOVAL  07/27/2011   Procedure: REMOVAL PORT-A-CATH;  Surgeon: Rolm Bookbinder, MD;  Location: [facility];  Service: General;  Laterality: N/A;   PORTACATH PLACEMENT  12/16/2009   SAVORY DILATION N/A 02/02/2013   Procedure: SAVORY DILATION;  Surgeon: Lafayette Dragon, MD;  [facility];  Service: Endoscopy;  Laterality: N/A;   SMALL INTESTINE SURGERY  05/20/2001   ex. lap., resection of small bowel stricture; gastrostomy; insertion central line   TEE WITHOUT CARDIOVERSION N/A 07/17/2019   Procedure: TRANSESOPHAGEAL ECHOCARDIOGRAM (TEE);  Surgeon: Donato Heinz, MD;  Location: [facility];  Service: Cardiovascular;  Laterality: N/A;   TONSILLECTOMY  age 39   TOTAL ABDOMINAL HYSTERECTOMY     complete   TOTAL HIP ARTHROPLASTY  08/03/2011   Procedure: TOTAL HIP ARTHROPLASTY ANTERIOR APPROACH;  Surgeon: Mcarthur Rossetti, MD;  [facility];  Service: Orthopedics;  Laterality: Right;   VIDEO BRONCHOSCOPY N/A 05/25/2019   Procedure: VIDEO BRONCHOSCOPY WITHOUT FLUORO;  Surgeon: Chesley Mires, MD;  [facility];  Service: Endoscopy;  Laterality: N/A;   Family History  Problem Relation Age of Onset   Heart disease Father    Stroke Mother    Heart disease Mother    Heart disease Brother    Breast cancer Maternal Aunt    Heart disease Paternal Aunt    Diabetes Paternal Grandfather    Colon cancer Neg Hx    Social History   Socioeconomic History   Marital status: Married    Spouse name: Not on  file   Number of children: 5   Years of education: Not on file   Highest education level: Not on file  Occupational History   Occupation: disabled    Employer: RETIRED  Tobacco Use   Smoking status: Former    Packs/day: 1.00    Years: 20.00    Pack years: 20.00    Types: Cigarettes    Quit date: 03/19/1986    Years since quitting: 13.7  Smokeless tobacco: Never  Vaping Use   Vaping Use: Never used  Substance and Sexual Activity   Alcohol use: No    Alcohol/week: 0.0 standard drinks   Drug use: No   Sexual activity: Yes  Other Topics Concern   Not on file  Social History Narrative   Occupation: Disabled   Married - second time - biologic daughter (in [place]) from first marriage - adopted 4 3 alive with second marriage   Alcohol Use - no   Illicit Drug Use - no   Patient is a former smoker, quit 1988 x32yr, <1ppd.    Daily Caffeine Use   01/20/2015 - updated   Social Determinants of Health   Financial Resource Strain: Medium Risk   Difficulty of Paying Living Expenses: Somewhat hard  Food Insecurity: No Food Insecurity   Worried About RCharity fundraiserin the Last Year: Never true   Ran Out of Food in the Last Year: Never true  Transportation Needs: No Transportation Needs   Lack of Transportation (Medical): No   Lack of Transportation (Non-Medical): No  Physical Activity: Insufficiently Active   Days of Exercise per Week: 6 days   Minutes of Exercise per Session: 20 min  Stress: No Stress Concern Present   Feeling of Stress : Not at all  Social Connections: Moderately Integrated   Frequency of Communication with Friends and Family: More than three times a week   Frequency of Social Gatherings with Friends and Family: More than three times a week   Attends Religious Services: More than 4 times per year   Active Member of [employer] Organizations: No   Attends CMusic therapist Never   Marital Status: Married    Tobacco Counseling Counseling given: Not  Answered   Clinical Intake:  Pre-visit preparation completed: Yes  Pain : 0-10 Pain Score: 3  Pain Type: Chronic pain Pain Location: Back Pain Onset: More than a month ago Pain Frequency: Constant     BMI - recorded: 17.38 Nutritional Status: BMI <19  Underweight Nutritional Risks: None Diabetes: No  How often do you need to have someone help you when you read instructions, pamphlets, or other written materials from your doctor or pharmacy?: 1 - Never  Diabetic?No  Interpreter Needed?: No  Information entered by :: MCaroleen HammanLPN   Activities of Daily Living In your present state of health, do you have any difficulty performing the following activities: 11/29/2020  Hearing? N  Vision? N  Difficulty concentrating or making decisions? N  Walking or climbing stairs? N  Dressing or bathing? N  Doing errands, shopping? N  Preparing Food and eating ? N  Using the Toilet? N  In the past six months, have you accidently leaked urine? Y  Do you have problems with loss of bowel control? N  Managing your Medications? N  Managing your Finances? N  Housekeeping or managing your Housekeeping? N  Some recent data might be hidden    Patient Care Team: LCarollee Herter YAlferd Apa DO as PCP - General HMinus Breeding MD as PCP - Cardiology (Cardiology) KDeboraha Sprang MD as PCP - Electrophysiology (Cardiology) WRolm Bookbinder MD as Consulting Physician (General Surgery) BLafayette Dragon MD (Inactive) as Consulting Physician (Gastroenterology) GGatha Mayer MD as Consulting Physician (Gastroenterology) SRutherford Guys MD as Consulting Physician (Ophthalmology) GCrista Luria MD as Consulting Physician (Dermatology) KDeboraha Sprang MD as Consulting Physician (Cardiology) HMinus Breeding MD as Consulting Physician (Cardiology) ECherre Robins PharmD (Pharmacist)  Indicate any recent Medical Services you may have received from other than [facility] providers in the past year (date  may be approximate).     Assessment:   This is a routine wellness examination for Kim[place].  Hearing/Vision screen Hearing Screening - Comments:: No issues Vision Screening - Comments:: Last eye exam-2021-Dr. Gers[facility]  Dietary issues and exercise activities discussed: Current Exercise Habits: Home exercise routine, Type of exercise: walking, Time (Minutes): 20, Frequency (Times/Week): 7, Weekly Exercise (Minutes/Week): 140, Intensity: Mild, Exercise limited by: None identified   Goals Addressed             This Visit's Progress    Patient Stated       Walk more       Depression Screen PHQ 2/9 Scores 11/29/2020 08/24/2020 02/09/2020 11/11/2019 09/08/2019 05/14/2019 01/01/2019  PHQ - 2 Score 0 0 0 0 0 0 0    Fall Risk Fall Risk  11/29/2020 08/24/2020 02/09/2020 09/08/2019 02/09/2019  Falls in the past year? 0 0 _0 Comment - - - - Emmi Telephone Survey: data to providers prior to load  Number falls in past yr: 0 - 0 1 1  Comment - - - - Emmi Telephone Survey Actual Response = 1  Injury with Fall? 0 - 1 0 1  Risk for fall due to : - - History of fall(s);Impaired balance/gait History of fall(s);Impaired mobility -  Risk for fall due to: Comment - - - - -  Follow up Falls prevention discussed Falls evaluation completed Falls evaluation completed Falls evaluation completed -    FALL RISK PREVENTION PERTAINING TO THE HOME:  Any stairs in or around the home? No  Home free of loose throw rugs in walkways, pet beds, electrical cords, etc? Yes  Adequate lighting in your home to reduce risk of falls? Yes   ASSISTIVE DEVICES UTILIZED TO PREVENT FALLS:  Life alert? No  Use of a cane, walker or w/c? No  Grab bars in the bathroom? Yes  Shower chair or bench in shower? No  Elevated toilet seat or a handicapped toilet? No   TIMED UP AND GO:  Was the test performed? No . Phone visit   Cognitive Function:Normal cognitive status assessed by this Nurse Health Advisor. No abnormalities  found.   MMSE - Mini Mental State Exam 12/28/2014  Orientation to time 5  Orientation to Place 5  Registration 3  Attention/ Calculation 5  Recall 3  Language- name 2 objects 2  Language- repeat 1  Language- follow 3 step command 3  Language- read & follow direction 1  Write a sentence 1  Copy design 1  Total score 30        Immunizations Immunization History  Administered Date(s) Administered   DTaP 11/03/1996, 01/04/1997, 05/03/1997, 10/19/1997, 08/05/2001   Fluad Quad(high Dose 65+) 11/06/2018, 12/31/2019   HPV Quadrivalent 09/05/2009, 09/13/2010, 09/26/2011   Hepatitis A, Ped/Adol-2 Dose 09/21/2004, 03/29/2005   Hepatitis B, ped/adol 04/30/1996, 11/03/1996, 10/19/1997   HiB (PRP-T) 11/03/1996, 01/04/1997, 05/03/1997, 10/19/1997   IPV 11/03/1996, 01/04/1997, 05/03/1997, 08/05/2001   Influenza Split 01/16/2011, 12/12/2011   Influenza Whole 01/15/2007, 12/08/2007, 12/28/2008, 12/17/2009   Influenza, High Dose Seasonal PF 11/28/2015, 12/07/2016, 11/15/2017   Influenza, Seasonal, Injecte, Preservative Fre 12/09/2012   Influenza,Quad,Nasal, Live 03/17/2007, 03/06/2009, 01/08/2010, 01/30/2011, 02/09/2012, 01/18/2013   Influenza,inj,Quad PF,6+ Mos 03/29/2005, 04/30/2005, 03/01/2006, 01/28/2008, 12/01/2013, 01/16/2014, 12/22/2014, 12/28/2014   MMR 05/03/1997, 08/05/2001   Meningococcal B, OMV 12/22/2014   Pneumococcal Conjugate-13 06/10/2014, 11/15/2014   Pneumococcal  Polysaccharide-23 12/17/2009   Tdap 09/17/2007, 05/15/2014, 12/09/2018   Varicella 11/16/1997, 05/18/2008   Zoster, Live 12/31/2013    TDAP status: Up to date  Flu Vaccine status: Due, Education has been provided regarding the importance of this vaccine. Advised may receive this vaccine at local pharmacy or Health Dept. Aware to provide a copy of the vaccination record if obtained from local pharmacy or Health Dept. Verbalized acceptance and understanding.  Pneumococcal vaccine status: Up to date  Covid-19  vaccine status: Declined, Education has been provided regarding the importance of this vaccine but patient still declined. Advised may receive this vaccine at local pharmacy or Health Dept.or vaccine clinic. Aware to provide a copy of the vaccination record if obtained from local pharmacy or Health Dept. Verbalized acceptance and understanding.  Qualifies for Shingles Vaccine? Yes   Zostavax completed Yes   Shingrix Completed?: No.    Education has been provided regarding the importance of this vaccine. Patient has been advised to call insurance company to determine out of pocket expense if they have not yet received this vaccine. Advised may also receive vaccine at local pharmacy or Health Dept. Verbalized acceptance and understanding.  Screening Tests Health Maintenance  Topic Date Due   COVID-19 Vaccine (1) Never done   Zoster Vaccines- Shingrix (1 of 2) Never done   MAMMOGRAM  09/14/2015   INFLUENZA VACCINE  10/17/2020   TETANUS/TDAP  12/08/2028   DEXA SCAN  Completed   Hepatitis C Screening  Completed   PNA vac Low Risk Adult  Completed   HPV VACCINES  Aged Out    Health Maintenance  Health Maintenance Due  Topic Date Due   COVID-19 Vaccine (1) Never done   Zoster Vaccines- Shingrix (1 of 2) Never done   MAMMOGRAM  09/14/2015   INFLUENZA VACCINE  10/17/2020    Colorectal cancer screening: No longer required.   Mammogram status: Declined  Bone Density status: Ordered today. Pt provided with contact info and advised to call to schedule appt.  Lung Cancer Screening: (Low Dose CT Chest recommended if Age 55-80 years, 30 pack-year currently smoking OR have quit w/in 15years.) does not qualify.     Additional Screening:  Hepatitis C Screening: Completed 05/31/2017  Vision Screening: Recommended annual ophthalmology exams for early detection of glaucoma and other disorders of the eye. Is the patient up to date with their annual eye exam?  Yes  Who is the provider or what is  the name of the office in which the patient attends annual eye exams? Dr. Gershon [facility]   Dental Screening: Recommended annual dental exams for proper oral hygiene  Community Resource Referral / Chronic Care Management: CRR required this visit?  No   CCM required this visit?  No      Plan:     I have personally reviewed and noted the following in the patient's chart:   Medical and social history Use of alcohol, tobacco or illicit drugs  Current medications and supplements including opioid prescriptions.  Functional ability and status Nutritional status Physical activity Advanced directives List of other physicians Hospitalizations, surgeries, and ER visits in previous 12 months Vitals Screenings to include cognitive, depression, and falls Referrals and appointments  In addition, I have reviewed and discussed with patient certain preventive protocols, quality metrics, and best practice recommendations. A written personalized care plan for preventive services as well as general preventive health recommendations were provided to patient.   Due to this being a telephonic visit, the after visit summary with  patients personalized plan was offered to patient via mail or my-chart. Patient would like to access on my-chart.   Marta Antu, LPN   11/20/8331  Nurse Health Advisor  Nurse Notes: None

## 2020-12-13 ENCOUNTER — Other Ambulatory Visit: Payer: Self-pay | Admitting: Family Medicine

## 2020-12-13 DIAGNOSIS — R52 Pain, unspecified: Secondary | ICD-10-CM

## 2020-12-13 MED ORDER — OXYCODONE-ACETAMINOPHEN 10-325 MG PO TABS: 1.0000 | ORAL_TABLET | ORAL | 0 refills | Status: DC | PRN

## 2020-12-13 NOTE — Telephone Encounter (Signed)
Medication:  oxyCODONE-acetaminophen (PERCOCET) 10-325 MG tablet  Has the patient contacted their pharmacy? No. (If no, request that the patient contact the pharmacy for the refill.) (If yes, when and what did the pharmacy advise?)  Preferred Pharmacy (with phone number or street name):  [facility], [address] [place], [place] [address]  Phone:  [phone]  Fax:  [phone]

## 2020-12-13 NOTE — Telephone Encounter (Signed)
Requesting: oxycodone 10-325mg  Contract: 04/25/2020 UDS: 04/25/2020 Last Visit: 07/22/2020 Next Visit: None Last Refill: 11/17/2020 #120 and 0RF  Please Advise

## 2020-12-14 ENCOUNTER — Other Ambulatory Visit: Payer: Self-pay | Admitting: Family Medicine

## 2020-12-14 DIAGNOSIS — G4700 Insomnia, unspecified: Secondary | ICD-10-CM

## 2020-12-14 NOTE — Telephone Encounter (Signed)
Requesting: Ambien 5mg   Contract: 04/25/2020 UDS: 04/25/2020 Last Visit: 07/22/2020 Next Visit: None Last Refill: 11/15/2020 #30 and 0RF  Please Advise

## 2020-12-16 ENCOUNTER — Ambulatory Visit (INDEPENDENT_AMBULATORY_CARE_PROVIDER_SITE_OTHER): Payer: HMO

## 2020-12-16 DIAGNOSIS — E785 Hyperlipidemia, unspecified: Secondary | ICD-10-CM | POA: Diagnosis not present

## 2020-12-16 DIAGNOSIS — I480 Paroxysmal atrial fibrillation: Secondary | ICD-10-CM | POA: Diagnosis not present

## 2020-12-16 DIAGNOSIS — M810 Age-related osteoporosis without current pathological fracture: Secondary | ICD-10-CM | POA: Diagnosis not present

## 2020-12-16 DIAGNOSIS — I428 Other cardiomyopathies: Secondary | ICD-10-CM

## 2020-12-16 DIAGNOSIS — J439 Emphysema, unspecified: Secondary | ICD-10-CM

## 2020-12-16 DIAGNOSIS — I10 Essential (primary) hypertension: Secondary | ICD-10-CM

## 2020-12-16 LAB — CUP PACEART REMOTE DEVICE CHECK
Battery Remaining Longevity: 100 mo
Battery Voltage: 3 V
Brady Statistic AP VP Percent: 1.21 %
Brady Statistic AP VS Percent: 0.03 %
Brady Statistic AS VP Percent: 98.58 %
Brady Statistic AS VS Percent: 0.18 %
Brady Statistic RA Percent Paced: 1.27 %
Brady Statistic RV Percent Paced: 99.79 %
Date Time Interrogation Session: 20220930023454
Implantable Lead Implant Date: 20111005
Implantable Lead Implant Date: 20111005
Implantable Lead Implant Date: 20111005
Implantable Lead Location: 753858
Implantable Lead Location: 753859
Implantable Lead Location: 753860
Implantable Lead Model: 4196
Implantable Lead Model: 5076
Implantable Lead Model: 5076
Implantable Pulse Generator Implant Date: 20210402
Lead Channel Impedance Value: 266 Ohm
Lead Channel Impedance Value: 323 Ohm
Lead Channel Impedance Value: 342 Ohm
Lead Channel Impedance Value: 361 Ohm
Lead Channel Impedance Value: 380 Ohm
Lead Channel Impedance Value: 418 Ohm
Lead Channel Impedance Value: 418 Ohm
Lead Channel Impedance Value: 456 Ohm
Lead Channel Impedance Value: 627 Ohm
Lead Channel Pacing Threshold Amplitude: 0.375 V
Lead Channel Pacing Threshold Amplitude: 0.625 V
Lead Channel Pacing Threshold Pulse Width: 0.4 ms
Lead Channel Pacing Threshold Pulse Width: 0.4 ms
Lead Channel Sensing Intrinsic Amplitude: 0.625 mV
Lead Channel Sensing Intrinsic Amplitude: 0.625 mV
Lead Channel Sensing Intrinsic Amplitude: 16.5 mV
Lead Channel Sensing Intrinsic Amplitude: 16.5 mV
Lead Channel Setting Pacing Amplitude: 1.25 V
Lead Channel Setting Pacing Amplitude: 2 V
Lead Channel Setting Pacing Amplitude: 2.5 V
Lead Channel Setting Pacing Pulse Width: 0.4 ms
Lead Channel Setting Pacing Pulse Width: 0.8 ms
Lead Channel Setting Sensing Sensitivity: 0.9 mV

## 2020-12-20 ENCOUNTER — Encounter: Payer: Self-pay | Admitting: Internal Medicine

## 2020-12-20 ENCOUNTER — Ambulatory Visit: Payer: HMO | Admitting: Internal Medicine

## 2020-12-20 VITALS — BP 110/70 | HR 80 | Ht 60.0 in | Wt 91.2 lb

## 2020-12-20 DIAGNOSIS — K219 Gastro-esophageal reflux disease without esophagitis: Secondary | ICD-10-CM | POA: Diagnosis not present

## 2020-12-20 DIAGNOSIS — K56609 Unspecified intestinal obstruction, unspecified as to partial versus complete obstruction: Secondary | ICD-10-CM

## 2020-12-20 MED ORDER — PROMETHAZINE HCL 25 MG PO TABS: 25.0000 mg | ORAL_TABLET | Freq: Four times a day (QID) | ORAL | 0 refills | Status: AC | PRN

## 2020-12-20 MED ORDER — PANTOPRAZOLE SODIUM 40 MG PO TBEC: 40.0000 mg | DELAYED_RELEASE_TABLET | Freq: Two times a day (BID) | ORAL | 3 refills | Status: AC

## 2020-12-20 NOTE — Patient Instructions (Signed)
We have sent the following medications to your pharmacy for you to pick up at your convenience: pantoprazole and promethazine.   Follow up with Dr. Carlean Purl in 1-2 years.   Thank you for choosing [facility] and Dr. Carlean Purl.

## 2020-12-20 NOTE — Progress Notes (Signed)
[name] 75 y.o. 01/28/1946 [id number]  Assessment & Plan:   Encounter Diagnoses  Name Primary?   Gastroesophageal reflux disease, unspecified whether esophagitis present Yes   SBO (small bowel obstruction), recurrent     Meds ordered this encounter  Medications   pantoprazole (PROTONIX) 40 MG tablet    Sig: Take 1 tablet (40 mg total) by mouth 2 (two) times daily.    Dispense:  180 tablet    Refill:  3    Wait for request to refill   promethazine (PHENERGAN) 25 MG tablet    Sig: Take 1 tablet (25 mg total) by mouth every 6 (six) hours as needed for nausea or vomiting.    Dispense:  30 tablet    Refill:  0     Return in 1 to 2 years sooner if needed.  Subjective:   Chief Complaint: Follow-up of GERD recurrent SBO issues  HPI [name] reports she is doing well in the last year or so she has had 3 episodes of small bowel obstruction managed at home.  She wants a refill on her Phenergan when she self treats these episodes.  GERD is under control.   Wt Readings from Last 3 Encounters:  12/20/20 91 lb 4 oz (41.4 kg)  11/29/20 89 lb (40.4 kg)  11/22/20 90 lb (40.8 kg)     Allergies  Allergen Reactions   Dabigatran Etexilate Mesylate Other (See Comments)    INTERNAL BLEEDING- Pradaxa   Augmentin [Amoxicillin-Pot Clavulanate] Diarrhea and Nausea Only    Did it involve swelling of the face/tongue/throat, SOB, or low BP? No Did it involve sudden or severe rash/hives, skin peeling, or any reaction on the inside of your mouth or nose? No Did you need to seek medical attention at a hospital or doctor's office? No When did it last happen? 02/2019 If all above answers are "NO", may proceed with cephalosporin use.    Talwin [Pentazocine] Other (See Comments)    Hallucinations    Current Meds  Medication Sig   acetaminophen (TYLENOL) 650 MG CR tablet Take 650 mg by mouth every 8 (eight) hours as needed for pain.   albuterol (VENTOLIN HFA) 108 (90 Base)  MCG/ACT inhaler Inhale 2 puffs into the lungs every 6 (six) hours as needed for wheezing or shortness of breath.   amLODipine (NORVASC) 2.5 MG tablet TAKE 1 TABLET BY MOUTH AT BEDTIME   apixaban (ELIQUIS) 5 MG TABS tablet Take 1 tablet (5 mg total) by mouth 2 (two) times daily.   atorvastatin (LIPITOR) 20 MG tablet TAKE 1 TABLET BY MOUTH ONCE DAILY AT  6PM   furosemide (LASIX) 40 MG tablet TAKE 1 TABLET BY MOUTH ONCE DAILY IN THE MORNING   gabapentin (NEURONTIN) 600 MG tablet Take 1 tablet (600 mg total) by mouth in the morning, at noon, in the evening, and at bedtime.   lidocaine-prilocaine (EMLA) cream Apply 1 application topically as needed. (Patient taking differently: Apply 1 application topically as needed (for port access- to numb).)   nitrofurantoin, macrocrystal-monohydrate, (MACROBID) 100 MG capsule Take 1 capsule (100 mg total) by mouth daily.   oxyCODONE-acetaminophen (PERCOCET) 10-325 MG tablet Take 1 tablet by mouth every 4 (four) hours as needed. PAIN   OXYGEN Inhale 3 L/min into the lungs continuous.    pantoprazole (PROTONIX) 40 MG tablet Take 1 tablet by mouth twice daily   tiZANidine (ZANAFLEX) 4 MG tablet Take 1 tablet by mouth three times daily as needed for muscle spasm  triamcinolone ointment (KENALOG) 0.1 % Apply 1 application topically daily as needed (to affected areas- for psoriasis).    umeclidinium bromide (INCRUSE ELLIPTA) 62.5 MCG/INH AEPB Inhale 1 puff into the lungs daily.   zolpidem (AMBIEN) 5 MG tablet TAKE 1 TABLET BY MOUTH EVERY DAY AT BEDTIME AS NEEDED   Past Medical History:  Diagnosis Date   Anemia    Arthritis    Atrial fibrillation (HCC)    CAD (coronary artery disease)    Mild disease per cath in 2011   COPD (chronic obstructive pulmonary disease) ([place])    Home O2   Diverticulitis    Elbow fracture, left aug 2012   Erythropoietin deficiency anemia 06/19/2018   Essential hypertension    Fibromyalgia    Gastroparesis    GERD (gastroesophageal  reflux disease)    H/O: GI bleed    from Pradaxa   Hepatitis    Hx of in high school    History of pneumonia    Recurrent   Hx MRSA infection    Hx of gastric ulcer    Hx of stress fx 10/2009   Right hip    Iron malabsorption 08/14/2017   LBBB (left bundle branch block)    Oxygen dependent    2 liters via nasal cannula at all times   Pacemaker    CRT therapy; followed by Dr. Caryl Comes   Pleural effusion     s/p right thoracentesis 03/09   Primary dilated cardiomyopathy ([place])    EF 45 to 50% per echo in Jan 2012   RLS (restless legs syndrome)    Silent aspiration    Small bowel obstruction (HCC)    Urinary incontinence    Venous embolism and thrombosis of subclavian vein ([place])    After pacemaker insertion Oct 2011   Vitamin B12 deficiency    Wears glasses    Reading   Past Surgical History:  Procedure Laterality Date   APPENDECTOMY     BIV PACEMAKER GENERATOR CHANGEOUT N/A 06/19/2019   Procedure: BIV PACEMAKER GENERATOR CHANGEOUT;  Surgeon: Deboraha Sprang, MD;  Location: [facility];  Service: Cardiovascular;  Laterality: N/A;   BRONCHIAL WASHINGS  05/25/2019   Procedure: BRONCHIAL WASHINGS;  Surgeon: Chesley Mires, MD;  Location: [facility];  Service: Endoscopy;;   CARDIAC CATHETERIZATION  04/08/2006, 11/16/2009   CHOLECYSTECTOMY N/A 06/27/2017   Procedure: OPEN CHOLECYSTECTOMY;  Surgeon: Ralene Ok, MD;  Location: [facility];  Service: General;  Laterality: N/A;   COLECTOMY     Intestinal resection (5times)   COLECTOMY  02/04/2000   ex. lap., intra-abd. subtotal colectomy with ileosigmoid colon anastomosies and lysis of adhesions   CYSTOSCOPY  11/11/2008   CYSTOSCOPY W/ RETROGRADES  11/11/2008   right   CYSTOSCOPY WITH INJECTION  04/30/2006   transurethral collagen injection; incision vaginal stenosis   ERCP N/A 06/23/2017   Procedure: ENDOSCOPIC RETROGRADE CHOLANGIOPANCREATOGRAPHY (ERCP);  Surgeon: Carol Ada, MD;  Location: [facility];  Service: Endoscopy;   Laterality: N/A;   ERCP N/A 06/24/2017   Procedure: ENDOSCOPIC RETROGRADE CHOLANGIOPANCREATOGRAPHY (ERCP);  Surgeon: [name], MD;  [facility];  Service: Endoscopy;  Laterality: N/A;   ESOPHAGOGASTRODUODENOSCOPY N/A 02/02/2013   Procedure: ESOPHAGOGASTRODUODENOSCOPY (EGD);  Surgeon: Lafayette Dragon, MD;  Location: [facility];  Service: Endoscopy;  Laterality: N/A;   EXPLORATORY LAPAROTOMY  04/27/2009   lysis of adhesions, gastrostomy tube   GASTROCUTANEOUS FISTULA CLOSURE     INTERSTIM IMPLANT PLACEMENT  05/28/2006 - stage I   06/05/2006 - stage  II   INTERSTIM IMPLANT PLACEMENT  02/05/2012   Procedure: [name] IMPLANT FIRST STAGE;  Surgeon: Reece Packer, MD;  Location: [facility];  Service: Urology;  Laterality: N/A;  Replacement of Interstim Lead      INTERSTIM IMPLANT REVISION  03/06/2011   Procedure: REVISION OF [name];  Surgeon: Reece Packer, MD;  Location: [facility];  Service: Urology;  Laterality: N/A;  Replacement of Neurostimulator   INTERSTIM IMPLANT REVISION  10/23/2007   IR IMAGING GUIDED PORT INSERTION  10/14/2017   PACEMAKER PLACEMENT  12/21/2009   Peg removed     with complications   PORT-A-CATH REMOVAL  07/27/2011   Procedure: REMOVAL PORT-A-CATH;  Surgeon: Rolm Bookbinder, MD;  Location: [facility];  Service: General;  Laterality: N/A;   PORTACATH PLACEMENT  12/16/2009   SAVORY DILATION N/A 02/02/2013   Procedure: SAVORY DILATION;  Surgeon: Lafayette Dragon, MD;  Location: [facility];  Service: Endoscopy;  Laterality: N/A;   SMALL INTESTINE SURGERY  05/20/2001   ex. lap., resection of small bowel stricture; gastrostomy; insertion central line   TEE WITHOUT CARDIOVERSION N/A 07/17/2019   Procedure: TRANSESOPHAGEAL ECHOCARDIOGRAM (TEE);  Surgeon: Donato Heinz, MD;  Location: [facility];  Service: Cardiovascular;  Laterality: N/A;   TONSILLECTOMY  age 57   TOTAL ABDOMINAL HYSTERECTOMY     complete   TOTAL HIP ARTHROPLASTY   08/03/2011   Procedure: TOTAL HIP ARTHROPLASTY ANTERIOR APPROACH;  Surgeon: Mcarthur Rossetti, MD;  Location: [facility];  Service: Orthopedics;  Laterality: Right;   VIDEO BRONCHOSCOPY N/A 05/25/2019   Procedure: VIDEO BRONCHOSCOPY WITHOUT FLUORO;  Surgeon: Chesley Mires, MD;  Location: [facility];  Service: Endoscopy;  Laterality: N/A;   Social History   Social History Narrative   Occupation: Disabled   Married - second time - biologic daughter (in [place]) from first marriage - adopted 4 3 alive with second marriage   Alcohol Use - no   Illicit Drug Use - no   Patient is a former smoker, quit 1988 x35yrs, <1ppd.    Daily Caffeine Use   01/20/2015 - updated   family history includes Breast cancer in her maternal aunt; Diabetes in her paternal grandfather; Heart disease in her brother, father, mother, and paternal aunt; Stroke in her mother.   Review of Systems As per HPI  Objective:   Physical Exam BP 110/70 (BP Location: Left Arm, Patient Position: Sitting, Cuff Size: Normal)   Pulse 80   Ht 5' (1.524 m) Comment: height measured without shoes  Wt 91 lb 4 oz (41.4 kg)   BMI 17.82 kg/m  Chronically ill elderly white woman on oxygen in no acute distress

## 2020-12-21 NOTE — Progress Notes (Signed)
Remote pacemaker transmission.   

## 2020-12-22 ENCOUNTER — Inpatient Hospital Stay: Payer: HMO | Attending: Family

## 2020-12-22 ENCOUNTER — Inpatient Hospital Stay: Payer: HMO

## 2020-12-22 ENCOUNTER — Other Ambulatory Visit: Payer: Self-pay

## 2020-12-22 DIAGNOSIS — D500 Iron deficiency anemia secondary to blood loss (chronic): Secondary | ICD-10-CM

## 2020-12-22 DIAGNOSIS — N189 Chronic kidney disease, unspecified: Secondary | ICD-10-CM | POA: Insufficient documentation

## 2020-12-22 DIAGNOSIS — D638 Anemia in other chronic diseases classified elsewhere: Secondary | ICD-10-CM

## 2020-12-22 DIAGNOSIS — D631 Anemia in chronic kidney disease: Secondary | ICD-10-CM | POA: Insufficient documentation

## 2020-12-22 LAB — CMP (CANCER CENTER ONLY)
ALT: 19 U/L (ref 0–44)
AST: 34 U/L (ref 15–41)
Albumin: 4.1 g/dL (ref 3.5–5.0)
Alkaline Phosphatase: 118 U/L (ref 38–126)
Anion gap: 7 (ref 5–15)
BUN: 24 mg/dL — ABNORMAL HIGH (ref 8–23)
CO2: 32 mmol/L (ref 22–32)
Calcium: 8.7 mg/dL — ABNORMAL LOW (ref 8.9–10.3)
Chloride: 100 mmol/L (ref 98–111)
Creatinine: 0.97 mg/dL (ref 0.44–1.00)
GFR, Estimated: >60 mL/min (ref >60)
Glucose, Bld: 95 mg/dL (ref 70–99)
Potassium: 4.1 mmol/L (ref 3.5–5.1)
Sodium: 139 mmol/L (ref 135–145)
Total Bilirubin: 0.3 mg/dL (ref 0.3–1.2)
Total Protein: 6.7 g/dL (ref 6.5–8.1)

## 2020-12-22 LAB — CBC WITH DIFFERENTIAL (CANCER CENTER ONLY)
Abs Immature Granulocytes: 0.02 10*3/uL (ref 0.00–0.07)
Basophils Absolute: 0.1 10*3/uL (ref 0.0–0.1)
Basophils Relative: 1 %
Eosinophils Absolute: 0.1 10*3/uL (ref 0.0–0.5)
Eosinophils Relative: 2 %
HCT: 34.4 % — ABNORMAL LOW (ref 36.0–46.0)
Hemoglobin: 10.9 g/dL — ABNORMAL LOW (ref 12.0–15.0)
Immature Granulocytes: 0 %
Lymphocytes Relative: 12 %
Lymphs Abs: 0.6 10*3/uL — ABNORMAL LOW (ref 0.7–4.0)
MCH: 28.8 pg (ref 26.0–34.0)
MCHC: 31.7 g/dL (ref 30.0–36.0)
MCV: 90.8 fL (ref 80.0–100.0)
Monocytes Absolute: 0.5 10*3/uL (ref 0.1–1.0)
Monocytes Relative: 9 %
Neutro Abs: 4.2 10*3/uL (ref 1.7–7.7)
Neutrophils Relative %: 76 %
Platelet Count: 205 10*3/uL (ref 150–400)
RBC: 3.79 MIL/uL — ABNORMAL LOW (ref 3.87–5.11)
RDW: 13.4 % (ref 11.5–15.5)
WBC Count: 5.5 10*3/uL (ref 4.0–10.5)
nRBC: 0 % (ref 0.0–0.2)

## 2020-12-22 MED ORDER — EPOETIN ALFA-EPBX 40000 UNIT/ML IJ SOLN
40000.0000 [IU] | Freq: Once | INTRAMUSCULAR | Status: AC
  Administered 2020-12-22: 40000 [IU] via SUBCUTANEOUS
  Filled 2020-12-22: qty 1

## 2020-12-22 NOTE — Patient Instructions (Signed)
Epoetin Alfa injection What is this medication? EPOETIN ALFA (e POE e tin AL fa) helps your body make more red blood cells. This medicine is used to treat anemia caused by chronic kidney disease, cancer chemotherapy, or HIV-therapy. It may also be used before surgery if you have anemia. This medicine may be used for other purposes; ask your health care provider or pharmacist if you have questions. COMMON BRAND NAME(S): Epogen, Procrit, Retacrit What should I tell my care team before I take this medication? They need to know if you have any of these conditions: cancer heart disease high blood pressure history of blood clots history of stroke low levels of folate, iron, or vitamin B12 in the blood seizures an unusual or allergic reaction to erythropoietin, albumin, benzyl alcohol, hamster proteins, other medicines, foods, dyes, or preservatives pregnant or trying to get pregnant breast-feeding How should I use this medication? This medicine is for injection into a vein or under the skin. It is usually given by a health care professional in a hospital or clinic setting. If you get this medicine at home, you will be taught how to prepare and give this medicine. Use exactly as directed. Take your medicine at regular intervals. Do not take your medicine more often than directed. It is important that you put your used needles and syringes in a special sharps container. Do not put them in a trash can. If you do not have a sharps container, call your pharmacist or healthcare provider to get one. A special MedGuide will be given to you by the pharmacist with each prescription and refill. Be sure to read this information carefully each time. Talk to your pediatrician regarding the use of this medicine in children. While this drug may be prescribed for selected conditions, precautions do apply. Overdosage: If you think you have taken too much of this medicine contact a poison control center or emergency  room at once. NOTE: This medicine is only for you. Do not share this medicine with others. What if I miss a dose? If you miss a dose, take it as soon as you can. If it is almost time for your next dose, take only that dose. Do not take double or extra doses. What may interact with this medication? Interactions have not been studied. This list may not describe all possible interactions. Give your health care provider a list of all the medicines, herbs, non-prescription drugs, or dietary supplements you use. Also tell them if you smoke, drink alcohol, or use illegal drugs. Some items may interact with your medicine. What should I watch for while using this medication? Your condition will be monitored carefully while you are receiving this medicine. You may need blood work done while you are taking this medicine. This medicine may cause a decrease in vitamin B6. You should make sure that you get enough vitamin B6 while you are taking this medicine. Discuss the foods you eat and the vitamins you take with your health care professional. What side effects may I notice from receiving this medication? Side effects that you should report to your doctor or health care professional as soon as possible: allergic reactions like skin rash, itching or hives, swelling of the face, lips, or tongue seizures signs and symptoms of a blood clot such as breathing problems; changes in vision; chest pain; severe, sudden headache; pain, swelling, warmth in the leg; trouble speaking; sudden numbness or weakness of the face, arm or leg signs and symptoms of a stroke like   changes in vision; confusion; trouble speaking or understanding; severe headaches; sudden numbness or weakness of the face, arm or leg; trouble walking; dizziness; loss of balance or coordination Side effects that usually do not require medical attention (report to your doctor or health care professional if they continue or are  bothersome): chills cough dizziness fever headaches joint pain muscle cramps muscle pain nausea, vomiting pain, redness, or irritation at site where injected This list may not describe all possible side effects. Call your doctor for medical advice about side effects. You may report side effects to FDA at 1-800-FDA-1088. Where should I keep my medication? Keep out of the reach of children. Store in a refrigerator between 2 and 8 degrees C (36 and 46 degrees F). Do not freeze or shake. Throw away any unused portion if using a single-dose vial. Multi-dose vials can be kept in the refrigerator for up to 21 days after the initial dose. Throw away unused medicine. NOTE: This sheet is a summary. It may not cover all possible information. If you have questions about this medicine, talk to your doctor, pharmacist, or health care provider.  2022 Elsevier/Gold Standard (2016-10-12 08:35:19)  

## 2020-12-22 NOTE — Patient Instructions (Signed)
Implanted Port Home Guide An implanted port is a device that is placed under the skin. It is usually placed in the chest. The device can be used to give IV medicine, to take blood, or for dialysis. You may have an implanted port if: You need IV medicine that would be irritating to the small veins in your hands or arms. You need IV medicines, such as antibiotics, for a long period of time. You need IV nutrition for a long period of time. You need dialysis. When you have a port, your health care provider can choose to use the port instead of veins in your arms for these procedures. You may have fewer limitations when using a port than you would if you used other types of long-term IVs, and you will likely be able to return to normal activities after your incision heals. An implanted port has two main parts: Reservoir. The reservoir is the part where a needle is inserted to give medicines or draw blood. The reservoir is round. After it is placed, it appears as a small, raised area under your skin. Catheter. The catheter is a thin, flexible tube that connects the reservoir to a vein. Medicine that is inserted into the reservoir goes into the catheter and then into the vein. How is my port accessed? To access your port: A numbing cream may be placed on the skin over the port site. Your health care provider will put on a mask and sterile gloves. The skin over your port will be cleaned carefully with a germ-killing soap and allowed to dry. Your health care provider will gently pinch the port and insert a needle into it. Your health care provider will check for a blood return to make sure the port is in the vein and is not clogged. If your port needs to remain accessed to get medicine continuously (constant infusion), your health care provider will place a clear bandage (dressing) over the needle site. The dressing and needle will need to be changed every week, or as told by your health care provider. What  is flushing? Flushing helps keep the port from getting clogged. Follow instructions from your health care provider about how and when to flush the port. Ports are usually flushed with saline solution or a medicine called heparin. The need for flushing will depend on how the port is used: If the port is only used from time to time to give medicines or draw blood, the port may need to be flushed: Before and after medicines have been given. Before and after blood has been drawn. As part of routine maintenance. Flushing may be recommended every 4-6 weeks. If a constant infusion is running, the port may not need to be flushed. Throw away any syringes in a disposal container that is meant for sharp items (sharps container). You can buy a sharps container from a pharmacy, or you can make one by using an empty hard plastic bottle with a cover. How long will my port stay implanted? The port can stay in for as long as your health care provider thinks it is needed. When it is time for the port to come out, a surgery will be done to remove it. The surgery will be similar to the procedure that was done to put the port in. Follow these instructions at home:  Flush your port as told by your health care provider. If you need an infusion over several days, follow instructions from your health care provider about how   to take care of your port site. Make sure you: Wash your hands with soap and water before you change your dressing. If soap and water are not available, use alcohol-based hand sanitizer. Change your dressing as told by your health care provider. Place any used dressings or infusion bags into a plastic bag. Throw that bag in the trash. Keep the dressing that covers the needle clean and dry. Do not get it wet. Do not use scissors or sharp objects near the tube. Keep the tube clamped, unless it is being used. Check your port site every day for signs of infection. Check for: Redness, swelling, or  pain. Fluid or blood. Pus or a bad smell. Protect the skin around the port site. Avoid wearing bra straps that rub or irritate the site. Protect the skin around your port from seat belts. Place a soft pad over your chest if needed. Bathe or shower as told by your health care provider. The site may get wet as long as you are not actively receiving an infusion. Return to your normal activities as told by your health care provider. Ask your health care provider what activities are safe for you. Carry a medical alert card or wear a medical alert bracelet at all times. This will let health care providers know that you have an implanted port in case of an emergency. Get help right away if: You have redness, swelling, or pain at the port site. You have fluid or blood coming from your port site. You have pus or a bad smell coming from the port site. You have a fever. Summary Implanted ports are usually placed in the chest for long-term IV access. Follow instructions from your health care provider about flushing the port and changing bandages (dressings). Take care of the area around your port by avoiding clothing that puts pressure on the area, and by watching for signs of infection. Protect the skin around your port from seat belts. Place a soft pad over your chest if needed. Get help right away if you have a fever or you have redness, swelling, pain, drainage, or a bad smell at the port site. This information is not intended to replace advice given to you by your health care provider. Make sure you discuss any questions you have with your health care provider. Document Revised: 05/25/2020 Document Reviewed: 07/20/2019 Elsevier Patient Education  2022 [employer]  

## 2020-12-23 LAB — IRON AND TIBC
Iron: 68 ug/dL (ref 41–142)
Saturation Ratios: 29 % (ref 21–57)
TIBC: 231 ug/dL — ABNORMAL LOW (ref 236–444)
UIBC: 163 ug/dL (ref 120–384)

## 2020-12-23 LAB — FERRITIN: Ferritin: 453 ng/mL — ABNORMAL HIGH (ref 11–307)

## 2021-01-06 ENCOUNTER — Other Ambulatory Visit: Payer: Self-pay

## 2021-01-06 DIAGNOSIS — R52 Pain, unspecified: Secondary | ICD-10-CM

## 2021-01-06 NOTE — Telephone Encounter (Signed)
Requesting: Percocet  Contract: 05/15/20 UDS: 04/25/20 Last OV: 07/22/2020 Next OV: N/A Last Refill: 12/13/2020, #[id number] RF Database:   Please advise

## 2021-01-06 NOTE — Telephone Encounter (Signed)
Pt called requesting monthly refill on oxycodone.  Pharmacy is [facility]

## 2021-01-06 NOTE — Telephone Encounter (Signed)
At [facility]

## 2021-01-08 MED ORDER — OXYCODONE-ACETAMINOPHEN 10-325 MG PO TABS: 1.0000 | ORAL_TABLET | ORAL | 0 refills | Status: DC | PRN

## 2021-01-11 ENCOUNTER — Other Ambulatory Visit: Payer: Self-pay | Admitting: Family Medicine

## 2021-01-11 DIAGNOSIS — R52 Pain, unspecified: Secondary | ICD-10-CM

## 2021-01-16 ENCOUNTER — Encounter: Payer: Self-pay | Admitting: Family Medicine

## 2021-01-16 ENCOUNTER — Ambulatory Visit (INDEPENDENT_AMBULATORY_CARE_PROVIDER_SITE_OTHER): Payer: HMO | Admitting: Family Medicine

## 2021-01-16 ENCOUNTER — Other Ambulatory Visit: Payer: Self-pay

## 2021-01-16 VITALS — BP 125/102 | HR 90 | Temp 98.0°F | Resp 16 | Wt 91.8 lb

## 2021-01-16 DIAGNOSIS — D500 Iron deficiency anemia secondary to blood loss (chronic): Secondary | ICD-10-CM | POA: Diagnosis not present

## 2021-01-16 DIAGNOSIS — M5440 Lumbago with sciatica, unspecified side: Secondary | ICD-10-CM | POA: Diagnosis not present

## 2021-01-16 DIAGNOSIS — D696 Thrombocytopenia, unspecified: Secondary | ICD-10-CM

## 2021-01-16 DIAGNOSIS — E785 Hyperlipidemia, unspecified: Secondary | ICD-10-CM

## 2021-01-16 DIAGNOSIS — E440 Moderate protein-calorie malnutrition: Secondary | ICD-10-CM

## 2021-01-16 DIAGNOSIS — G8929 Other chronic pain: Secondary | ICD-10-CM

## 2021-01-16 DIAGNOSIS — G4700 Insomnia, unspecified: Secondary | ICD-10-CM

## 2021-01-16 DIAGNOSIS — I480 Paroxysmal atrial fibrillation: Secondary | ICD-10-CM | POA: Diagnosis not present

## 2021-01-16 DIAGNOSIS — D708 Other neutropenia: Secondary | ICD-10-CM

## 2021-01-16 DIAGNOSIS — Z9981 Dependence on supplemental oxygen: Secondary | ICD-10-CM

## 2021-01-16 DIAGNOSIS — G894 Chronic pain syndrome: Secondary | ICD-10-CM

## 2021-01-16 DIAGNOSIS — Z23 Encounter for immunization: Secondary | ICD-10-CM | POA: Diagnosis not present

## 2021-01-16 DIAGNOSIS — I10 Essential (primary) hypertension: Secondary | ICD-10-CM

## 2021-01-16 DIAGNOSIS — I428 Other cardiomyopathies: Secondary | ICD-10-CM | POA: Diagnosis not present

## 2021-01-16 DIAGNOSIS — M797 Fibromyalgia: Secondary | ICD-10-CM | POA: Diagnosis not present

## 2021-01-16 MED ORDER — ZOLPIDEM TARTRATE 5 MG PO TABS: ORAL_TABLET | ORAL | 2 refills | Status: DC

## 2021-01-16 NOTE — Assessment & Plan Note (Signed)
Encourage heart healthy diet such as MIND or DASH diet, increase exercise, avoid trans fats, simple carbohydrates and processed foods, consider a krill or fish or flaxseed oil cap daily.  °

## 2021-01-16 NOTE — Assessment & Plan Note (Signed)
Stable

## 2021-01-16 NOTE — Assessment & Plan Note (Signed)
Well controlled, no changes to meds. Encouraged heart healthy diet such as the DASH diet and exercise as tolerated.  °

## 2021-01-16 NOTE — Assessment & Plan Note (Signed)
Per hematology 

## 2021-01-16 NOTE — Progress Notes (Signed)
Established Patient Office Visit  Subjective:  Patient ID: [name], female    DOB: 31-Oct-1945  Age: 75 y.o. MRN: [id number]  CC:  Chief Complaint  Patient presents with   Fibromyalgia    Here for follow up   Back Pain    Follow up    HPI [name] presents for f/u fibromyalgia / back pain and refills on meds.   No new complaints.   Uds/ contract utd   Past Medical History:  Diagnosis Date   Anemia    Arthritis    Atrial fibrillation ([place])    CAD (coronary artery disease)    Mild disease per cath in 2011   COPD (chronic obstructive pulmonary disease) ([place])    Home O2   Diverticulitis    Elbow fracture, left aug 2012   Erythropoietin deficiency anemia 06/19/2018   Essential hypertension    Fibromyalgia    Gastroparesis    GERD (gastroesophageal reflux disease)    H/O: GI bleed    from Pradaxa   Hepatitis    Hx of in high school    History of pneumonia    Recurrent   Hx MRSA infection    Hx of gastric ulcer    Hx of stress fx 10/2009   Right hip    Iron malabsorption 08/14/2017   LBBB (left bundle branch block)    Oxygen dependent    2 liters via nasal cannula at all times   Pacemaker    CRT therapy; followed by Dr. Caryl Comes   Pleural effusion     s/p right thoracentesis 03/09   Primary dilated cardiomyopathy ([place])    EF 45 to 50% per echo in Jan 2012   RLS (restless legs syndrome)    Silent aspiration    Small bowel obstruction (HCC)    Urinary incontinence    Venous embolism and thrombosis of subclavian vein ([place])    After pacemaker insertion Oct 2011   Vitamin B12 deficiency    Wears glasses    Reading    Past Surgical History:  Procedure Laterality Date   APPENDECTOMY     BIV PACEMAKER GENERATOR CHANGEOUT N/A 06/19/2019   Procedure: BIV PACEMAKER GENERATOR CHANGEOUT;  Surgeon: Deboraha Sprang, MD;  [facility];  Service: Cardiovascular;  Laterality: N/A;   BRONCHIAL WASHINGS  05/25/2019   Procedure: BRONCHIAL WASHINGS;   Surgeon: Chesley Mires, MD;  [facility];  Service: Endoscopy;;   CARDIAC CATHETERIZATION  04/08/2006, 11/16/2009   CHOLECYSTECTOMY N/A 06/27/2017   Procedure: OPEN CHOLECYSTECTOMY;  Surgeon: Ralene Ok, MD;  Location: [facility];  Service: General;  Laterality: N/A;   COLECTOMY     Intestinal resection (5times)   COLECTOMY  02/04/2000   ex. lap., intra-abd. subtotal colectomy with ileosigmoid colon anastomosies and lysis of adhesions   CYSTOSCOPY  11/11/2008   CYSTOSCOPY W/ RETROGRADES  11/11/2008   right   CYSTOSCOPY WITH INJECTION  04/30/2006   transurethral collagen injection; incision vaginal stenosis   ERCP N/A 06/23/2017   Procedure: ENDOSCOPIC RETROGRADE CHOLANGIOPANCREATOGRAPHY (ERCP);  Surgeon: Carol Ada, MD;  Location: [facility] [place];  Service: Endoscopy;  Laterality: N/A;   ERCP N/A 06/24/2017   Procedure: ENDOSCOPIC RETROGRADE CHOLANGIOPANCREATOGRAPHY (ERCP);  Surgeon: [name], MD;  Location: [facility];  Service: Endoscopy;  Laterality: N/A;   ESOPHAGOGASTRODUODENOSCOPY N/A 02/02/2013   Procedure: ESOPHAGOGASTRODUODENOSCOPY (EGD);  Surgeon: Lafayette Dragon, MD;  Location: [facility];  Service: Endoscopy;  Laterality: N/A;   EXPLORATORY LAPAROTOMY  04/27/2009  lysis of adhesions, gastrostomy tube   GASTROCUTANEOUS FISTULA CLOSURE     INTERSTIM IMPLANT PLACEMENT  05/28/2006 - stage I   06/05/2006 - stage II   INTERSTIM IMPLANT PLACEMENT  02/05/2012   Procedure: [name] IMPLANT FIRST STAGE;  Surgeon: Reece Packer, MD;  [facility];  Service: Urology;  Laterality: N/A;  Replacement of Interstim Lead      INTERSTIM IMPLANT REVISION  03/06/2011   Procedure: REVISION OF [name];  Surgeon: Reece Packer, MD;  [facility];  Service: Urology;  Laterality: N/A;  Replacement of Neurostimulator   INTERSTIM IMPLANT REVISION  10/23/2007   IR IMAGING GUIDED PORT INSERTION  10/14/2017   PACEMAKER PLACEMENT  12/21/2009   Peg removed     with complications    PORT-A-CATH REMOVAL  07/27/2011   Procedure: REMOVAL PORT-A-CATH;  Surgeon: Rolm Bookbinder, MD;  Location: [place];  Service: General;  Laterality: N/A;   PORTACATH PLACEMENT  12/16/2009   SAVORY DILATION N/A 02/02/2013   Procedure: SAVORY DILATION;  Surgeon: Lafayette Dragon, MD;  [facility];  Service: Endoscopy;  Laterality: N/A;   SMALL INTESTINE SURGERY  05/20/2001   ex. lap., resection of small bowel stricture; gastrostomy; insertion central line   TEE WITHOUT CARDIOVERSION N/A 07/17/2019   Procedure: TRANSESOPHAGEAL ECHOCARDIOGRAM (TEE);  Surgeon: Donato Heinz, MD;  Location: [facility];  Service: Cardiovascular;  Laterality: N/A;   TONSILLECTOMY  age 57   TOTAL ABDOMINAL HYSTERECTOMY     complete   TOTAL HIP ARTHROPLASTY  08/03/2011   Procedure: TOTAL HIP ARTHROPLASTY ANTERIOR APPROACH;  Surgeon: Mcarthur Rossetti, MD;  [facility];  Service: Orthopedics;  Laterality: Right;   VIDEO BRONCHOSCOPY N/A 05/25/2019   Procedure: VIDEO BRONCHOSCOPY WITHOUT FLUORO;  Surgeon: Chesley Mires, MD;  [facility];  Service: Endoscopy;  Laterality: N/A;    Family History  Problem Relation Age of Onset   Heart disease Father    Stroke Mother    Heart disease Mother    Heart disease Brother    Breast cancer Maternal Aunt    Heart disease Paternal Aunt    Diabetes Paternal Grandfather    Colon cancer Neg Hx     Social History   Socioeconomic History   Marital status: Married    Spouse name: Not on file   Number of children: 5   Years of education: Not on file   Highest education level: Not on file  Occupational History   Occupation: disabled    Employer: RETIRED  Tobacco Use   Smoking status: Former    Packs/day: 1.00    Years: 20.00    Pack years: 20.00    Types: Cigarettes    Quit date: 03/19/1986    Years since quitting: 34.8   Smokeless tobacco: Never  Vaping Use   Vaping Use: Never used  Substance and Sexual Activity    Alcohol use: No    Alcohol/week: 0.0 standard drinks   Drug use: No   Sexual activity: Yes  Other Topics Concern   Not on file  Social History Narrative   Occupation: Disabled   Married - second time - biologic daughter (in [place]) from first marriage - adopted 4 3 alive with second marriage   Alcohol Use - no   Illicit Drug Use - no   Patient is a former smoker, quit 1988 x35yrs, <1ppd.    Daily Caffeine Use   01/20/2015 - updated   Social Determinants of Health  Financial Resource Strain: Medium Risk   Difficulty of Paying Living Expenses: Somewhat hard  Food Insecurity: No Food Insecurity   Worried About Charity fundraiser in the Last Year: Never true   Ran Out of Food in the Last Year: Never true  Transportation Needs: No Transportation Needs   Lack of Transportation (Medical): No   Lack of Transportation (Non-Medical): No  Physical Activity: Insufficiently Active   Days of Exercise per Week: 6 days   Minutes of Exercise per Session: 20 min  Stress: No Stress Concern Present   Feeling of Stress : Not at all  Social Connections: Moderately Integrated   Frequency of Communication with Friends and Family: More than three times a week   Frequency of Social Gatherings with Friends and Family: More than three times a week   Attends Religious Services: More than 4 times per year   Active Member of [employer] or Organizations: No   Attends Archivist Meetings: Never   Marital Status: Married  Human resources officer Violence: Not At Risk   Fear of Current or Ex-Partner: No   Emotionally Abused: No   Physically Abused: No   Sexually Abused: No    Outpatient Medications Prior to Visit  Medication Sig Dispense Refill   acetaminophen (TYLENOL) 650 MG CR tablet Take 650 mg by mouth every 8 (eight) hours as needed for pain.     albuterol (VENTOLIN HFA) 108 (90 Base) MCG/ACT inhaler Inhale 2 puffs into the lungs every 6 (six) hours as needed for wheezing or shortness of breath.      amLODipine (NORVASC) 2.5 MG tablet TAKE 1 TABLET BY MOUTH AT BEDTIME 90 tablet 2   apixaban (ELIQUIS) 5 MG TABS tablet Take 1 tablet (5 mg total) by mouth 2 (two) times daily. 180 tablet 0   atorvastatin (LIPITOR) 20 MG tablet TAKE 1 TABLET BY MOUTH ONCE DAILY AT  6PM 90 tablet 1   furosemide (LASIX) 40 MG tablet TAKE 1 TABLET BY MOUTH ONCE DAILY IN THE MORNING 90 tablet 0   gabapentin (NEURONTIN) 600 MG tablet TAKE 1 TABLET BY MOUTH ONCE DAILY IN THE MORNING, AND AT NOON, AND IN THE EVENING, AND AT BEDTIME 120 tablet 0   nitrofurantoin, macrocrystal-monohydrate, (MACROBID) 100 MG capsule Take 1 capsule (100 mg total) by mouth daily.     nitroGLYCERIN (NITROSTAT) 0.4 MG SL tablet Place 1 tablet (0.4 mg total) under the tongue every 5 (five) minutes as needed. CHEST PAIN 25 tablet 6   oxyCODONE-acetaminophen (PERCOCET) 10-325 MG tablet Take 1 tablet by mouth every 4 (four) hours as needed. PAIN 120 tablet 0   OXYGEN Inhale 3 L/min into the lungs continuous.      pantoprazole (PROTONIX) 40 MG tablet Take 1 tablet (40 mg total) by mouth 2 (two) times daily. 180 tablet 3   promethazine (PHENERGAN) 25 MG tablet Take 1 tablet (25 mg total) by mouth every 6 (six) hours as needed for nausea or vomiting. 30 tablet 0   tiZANidine (ZANAFLEX) 4 MG tablet Take 1 tablet by mouth three times daily as needed for muscle spasm 90 tablet 2   triamcinolone ointment (KENALOG) 0.1 % Apply 1 application topically daily as needed (to affected areas- for psoriasis).      umeclidinium bromide (INCRUSE ELLIPTA) 62.5 MCG/INH AEPB Inhale 1 puff into the lungs daily. 90 each 3   lidocaine-prilocaine (EMLA) cream Apply 1 application topically as needed. (Patient taking differently: Apply 1 application topically as needed (for port access- to  numb).) 30 g 6   zolpidem (AMBIEN) 5 MG tablet TAKE 1 TABLET BY MOUTH EVERY DAY AT BEDTIME AS NEEDED 30 tablet 0   No facility-administered medications prior to visit.    Allergies   Allergen Reactions   Dabigatran Etexilate Mesylate Other (See Comments)    INTERNAL BLEEDING- Pradaxa   Augmentin [Amoxicillin-Pot Clavulanate] Diarrhea and Nausea Only    Did it involve swelling of the face/tongue/throat, SOB, or low BP? No Did it involve sudden or severe rash/hives, skin peeling, or any reaction on the inside of your mouth or nose? No Did you need to seek medical attention at a hospital or doctor's office? No When did it last happen? 02/2019 If all above answers are "NO", may proceed with cephalosporin use.    Talwin [Pentazocine] Other (See Comments)    Hallucinations     ROS Review of Systems  Constitutional:  Negative for chills and fever.  HENT:  Negative for congestion and hearing loss.   Eyes:  Negative for discharge.  Respiratory:  Negative for cough and shortness of breath.   Cardiovascular:  Negative for chest pain, palpitations and leg swelling.  Gastrointestinal:  Negative for abdominal pain, blood in stool, constipation, diarrhea, nausea and vomiting.  Genitourinary:  Negative for dysuria, frequency, hematuria and urgency.  Musculoskeletal:  Positive for arthralgias, back pain and myalgias.  Skin:  Negative for rash.  Allergic/Immunologic: Negative for environmental allergies.  Neurological:  Negative for dizziness, weakness and headaches.  Hematological:  Does not bruise/bleed easily.  Psychiatric/Behavioral:  Negative for suicidal ideas. The patient is not nervous/anxious.      Objective:    Physical Exam Vitals and nursing note reviewed.  Constitutional:      Appearance: She is well-developed.  HENT:     Head: Normocephalic and atraumatic.  Eyes:     Conjunctiva/sclera: Conjunctivae normal.  Neck:     Thyroid: No thyromegaly.     Vascular: No carotid bruit or JVD.  Cardiovascular:     Rate and Rhythm: Normal rate and regular rhythm.     Heart sounds: Normal heart sounds. No murmur heard. Pulmonary:     Effort: Pulmonary effort is  normal. No respiratory distress.     Breath sounds: Normal breath sounds. No wheezing or rales.  Chest:     Chest wall: No tenderness.  Musculoskeletal:     Cervical back: Normal range of motion and neck supple.  Neurological:     Mental Status: She is alert and oriented to person, place, and time.    BP (!) 125/102 (BP Location: Right Arm, Patient Position: Sitting, Cuff Size: Small)   Pulse 90   Temp 98 F (36.7 C) (Oral)   Resp 16   Wt 91 lb 12.8 oz (41.6 kg)   SpO2 100%   BMI 17.93 kg/m  Wt Readings from Last 3 Encounters:  01/16/21 91 lb 12.8 oz (41.6 kg)  12/20/20 91 lb 4 oz (41.4 kg)  11/29/20 89 lb (40.4 kg)     Health Maintenance Due  Topic Date Due   COVID-19 Vaccine (1) Never done   Zoster Vaccines- Shingrix (1 of 2) Never done   MAMMOGRAM  09/14/2015    There are no preventive care reminders to display for this patient.  Lab Results  Component Value Date   TSH 7.708 (H) 07/20/2019   Lab Results  Component Value Date   WBC 5.5 12/22/2020   HGB 10.9 (L) 12/22/2020   HCT 34.4 (L) 12/22/2020   MCV  90.8 12/22/2020   PLT 205 12/22/2020   Lab Results  Component Value Date   NA 139 12/22/2020   K 4.1 12/22/2020   CO2 32 12/22/2020   GLUCOSE 95 12/22/2020   BUN 24 (H) 12/22/2020   CREATININE 0.97 12/22/2020   BILITOT 0.3 12/22/2020   ALKPHOS 118 12/22/2020   AST 34 12/22/2020   ALT 19 12/22/2020   PROT 6.7 12/22/2020   ALBUMIN 4.1 12/22/2020   CALCIUM 8.7 (L) 12/22/2020   ANIONGAP 7 12/22/2020   GFR 71.19 08/18/2018   Lab Results  Component Value Date   CHOL 102 05/11/2019   Lab Results  Component Value Date   HDL 29 (L) 05/11/2019   Lab Results  Component Value Date   LDLCALC 54 05/11/2019   Lab Results  Component Value Date   TRIG 97 05/11/2019   Lab Results  Component Value Date   CHOLHDL 3.5 05/11/2019   Lab Results  Component Value Date   HGBA1C 5.2 05/31/2017      Assessment & Plan:   Problem List Items Addressed  This Visit       Unprioritized   Atrial fibrillation/flutter (Chronic)   Essential hypertension (Chronic)    Well controlled, no changes to meds. Encouraged heart healthy diet such as the DASH diet and exercise as tolerated.       Insomnia   Relevant Medications   zolpidem (AMBIEN) 5 MG tablet   HLD (hyperlipidemia) - Primary   Relevant Orders   Comprehensive metabolic panel   Lipid panel   Protein-calorie malnutrition, moderate (HCC)   NICM (nonischemic cardiomyopathy) (HCC)   Other neutropenia (HCC)   Chronic pain syndrome    uds and contract utd        Fibromyalgia    Well controlled, no changes to meds. Encouraged heart healthy diet such as the DASH diet and exercise as tolerated.       Hyperlipidemia LDL goal <100    Encourage heart healthy diet such as MIND or DASH diet, increase exercise, avoid trans fats, simple carbohydrates and processed foods, consider a krill or fish or flaxseed oil cap daily.       Iron deficiency anemia due to chronic blood loss    Iron infusions per hematology      Oxygen dependent    Stable       Platelets decreased (HCC)    Per hematology      Other Visit Diagnoses     Needs flu shot       Relevant Orders   Flu Vaccine QUAD High Dose(Fluad) (Completed)   Chronic bilateral low back pain with sciatica, sciatica laterality unspecified       Relevant Medications   zolpidem (AMBIEN) 5 MG tablet       Meds ordered this encounter  Medications   zolpidem (AMBIEN) 5 MG tablet    Sig: TAKE 1 TABLET BY MOUTH EVERY DAY AT BEDTIME AS NEEDED    Dispense:  30 tablet    Refill:  2    Follow-up: Return in about 3 months (around 04/18/2021), or if symptoms worsen or fail to improve, for annual exam, fasting.    Ann Held, DO

## 2021-01-16 NOTE — Assessment & Plan Note (Signed)
uds and contract utd

## 2021-01-16 NOTE — Patient Instructions (Signed)

## 2021-01-16 NOTE — Assessment & Plan Note (Signed)
Iron infusions per hematology

## 2021-01-17 LAB — LIPID PANEL
Cholesterol: 135 mg/dL (ref 0–200)
HDL: 53.1 mg/dL (ref >39.00)
LDL Cholesterol: 66 mg/dL (ref 0–99)
NonHDL: 81.76
Total CHOL/HDL Ratio: 3
Triglycerides: 79 mg/dL (ref 0.0–149.0)
VLDL: 15.8 mg/dL (ref 0.0–40.0)

## 2021-01-17 LAB — COMPREHENSIVE METABOLIC PANEL
ALT: 23 U/L (ref 0–35)
AST: 36 U/L (ref 0–37)
Albumin: 4.4 g/dL (ref 3.5–5.2)
Alkaline Phosphatase: 118 U/L — ABNORMAL HIGH (ref 39–117)
BUN: 20 mg/dL (ref 6–23)
CO2: 33 mEq/L — ABNORMAL HIGH (ref 19–32)
Calcium: 8.6 mg/dL (ref 8.4–10.5)
Chloride: 98 mEq/L (ref 96–112)
Creatinine, Ser: 0.94 mg/dL (ref 0.40–1.20)
GFR: 59.07 mL/min — ABNORMAL LOW (ref >60.00)
Glucose, Bld: 83 mg/dL (ref 70–99)
Potassium: 4.4 mEq/L (ref 3.5–5.1)
Sodium: 138 mEq/L (ref 135–145)
Total Bilirubin: 0.4 mg/dL (ref 0.2–1.2)
Total Protein: 7 g/dL (ref 6.0–8.3)

## 2021-01-23 ENCOUNTER — Inpatient Hospital Stay: Payer: HMO | Admitting: Family

## 2021-01-23 ENCOUNTER — Inpatient Hospital Stay: Payer: HMO

## 2021-01-23 ENCOUNTER — Inpatient Hospital Stay: Payer: HMO | Attending: Family

## 2021-01-23 ENCOUNTER — Other Ambulatory Visit: Payer: Self-pay

## 2021-01-23 ENCOUNTER — Encounter: Payer: Self-pay | Admitting: Family

## 2021-01-23 VITALS — BP 129/65 | HR 85 | Temp 97.3°F | Resp 19 | Wt 90.8 lb

## 2021-01-23 DIAGNOSIS — N189 Chronic kidney disease, unspecified: Secondary | ICD-10-CM | POA: Diagnosis not present

## 2021-01-23 DIAGNOSIS — K909 Intestinal malabsorption, unspecified: Secondary | ICD-10-CM | POA: Diagnosis not present

## 2021-01-23 DIAGNOSIS — D508 Other iron deficiency anemias: Secondary | ICD-10-CM | POA: Diagnosis not present

## 2021-01-23 DIAGNOSIS — D500 Iron deficiency anemia secondary to blood loss (chronic): Secondary | ICD-10-CM | POA: Diagnosis not present

## 2021-01-23 DIAGNOSIS — Z79899 Other long term (current) drug therapy: Secondary | ICD-10-CM | POA: Diagnosis not present

## 2021-01-23 DIAGNOSIS — D631 Anemia in chronic kidney disease: Secondary | ICD-10-CM | POA: Diagnosis not present

## 2021-01-23 DIAGNOSIS — Z7901 Long term (current) use of anticoagulants: Secondary | ICD-10-CM | POA: Insufficient documentation

## 2021-01-23 DIAGNOSIS — D509 Iron deficiency anemia, unspecified: Secondary | ICD-10-CM | POA: Diagnosis present

## 2021-01-23 DIAGNOSIS — D638 Anemia in other chronic diseases classified elsewhere: Secondary | ICD-10-CM

## 2021-01-23 LAB — CMP (CANCER CENTER ONLY)
ALT: 16 U/L (ref 0–44)
AST: 22 U/L (ref 15–41)
Albumin: 4.3 g/dL (ref 3.5–5.0)
Alkaline Phosphatase: 109 U/L (ref 38–126)
Anion gap: 8 (ref 5–15)
BUN: 24 mg/dL — ABNORMAL HIGH (ref 8–23)
CO2: 31 mmol/L (ref 22–32)
Calcium: 8.8 mg/dL — ABNORMAL LOW (ref 8.9–10.3)
Chloride: 99 mmol/L (ref 98–111)
Creatinine: 0.96 mg/dL (ref 0.44–1.00)
GFR, Estimated: >60 mL/min
Glucose, Bld: 106 mg/dL — ABNORMAL HIGH (ref 70–99)
Potassium: 4.1 mmol/L (ref 3.5–5.1)
Sodium: 138 mmol/L (ref 135–145)
Total Bilirubin: 0.4 mg/dL (ref 0.3–1.2)
Total Protein: 6.9 g/dL (ref 6.5–8.1)

## 2021-01-23 LAB — CBC WITH DIFFERENTIAL (CANCER CENTER ONLY)
Abs Immature Granulocytes: 0.03 K/uL (ref 0.00–0.07)
Basophils Absolute: 0.1 K/uL (ref 0.0–0.1)
Basophils Relative: 1 %
Eosinophils Absolute: 0.2 K/uL (ref 0.0–0.5)
Eosinophils Relative: 2 %
HCT: 35.9 % — ABNORMAL LOW (ref 36.0–46.0)
Hemoglobin: 11.5 g/dL — ABNORMAL LOW (ref 12.0–15.0)
Immature Granulocytes: 1 %
Lymphocytes Relative: 9 %
Lymphs Abs: 0.6 K/uL — ABNORMAL LOW (ref 0.7–4.0)
MCH: 28.5 pg (ref 26.0–34.0)
MCHC: 32 g/dL (ref 30.0–36.0)
MCV: 89.1 fL (ref 80.0–100.0)
Monocytes Absolute: 0.7 K/uL (ref 0.1–1.0)
Monocytes Relative: 10 %
Neutro Abs: 5 K/uL (ref 1.7–7.7)
Neutrophils Relative %: 77 %
Platelet Count: 228 K/uL (ref 150–400)
RBC: 4.03 MIL/uL (ref 3.87–5.11)
RDW: 14.2 % (ref 11.5–15.5)
WBC Count: 6.5 K/uL (ref 4.0–10.5)
nRBC: 0 % (ref 0.0–0.2)

## 2021-01-23 NOTE — Progress Notes (Signed)
Hematology and Oncology Follow Up Visit  [name] [id number] 1945/06/19 75 y.o. 01/23/2021   Principle Diagnosis:  Iron deficiency anemia Erythropoietin deficient anemia    Current Therapy:        IV iron as indicated  Retacrit 40,000 units SQ for hgb < 11   Interim History:  [name] is here today for follow-up. She is doing well and excited to go to [place] this weekend for her granddaughter's wedding.  She has some mild fatigue at times.  She has not noted any blood loss. No bruising or petechiae. Her SOB has remained stable on supplemental O2.  No fever, chills, n/v, cough, rash, dizziness, chest pain, palpitations, abdominal pain or changes in bowel or bladder habits.  No swelling or tenderness in her extremities extremities.  She has intermittent numbness or tingling in her hands and feet as well as leg cramps. She notes that sipping pickle juice helps get rid of the leg cramps.  No falls or syncope to report.  She is eating well and doing her best to stay well hydrated. Her weight is stable at 90 lbs.   ECOG Performance Status: 1 - Symptomatic but completely ambulatory  Medications:  Allergies as of 01/23/2021       Reactions   Dabigatran Etexilate Mesylate Other (See Comments)   INTERNAL BLEEDING- Pradaxa   Augmentin [amoxicillin-pot Clavulanate] Diarrhea, Nausea Only   Did it involve swelling of the face/tongue/throat, SOB, or low BP? No Did it involve sudden or severe rash/hives, skin peeling, or any reaction on the inside of your mouth or nose? No Did you need to seek medical attention at a hospital or doctor's office? No When did it last happen? 02/2019 If all above answers are "NO", may proceed with cephalosporin use.   Talwin [pentazocine] Other (See Comments)   Hallucinations        Medication List        Accurate as of January 23, 2021 11:41 AM. If you have any questions, ask your nurse or doctor.          acetaminophen 650 MG CR  tablet Commonly known as: TYLENOL Take 650 mg by mouth every 8 (eight) hours as needed for pain.   albuterol 108 (90 Base) MCG/ACT inhaler Commonly known as: VENTOLIN HFA Inhale 2 puffs into the lungs every 6 (six) hours as needed for wheezing or shortness of breath.   amLODipine 2.5 MG tablet Commonly known as: NORVASC TAKE 1 TABLET BY MOUTH AT BEDTIME   atorvastatin 20 MG tablet Commonly known as: LIPITOR TAKE 1 TABLET BY MOUTH ONCE DAILY AT  6PM   Eliquis 5 MG Tabs tablet Generic drug: apixaban Take 1 tablet (5 mg total) by mouth 2 (two) times daily.   furosemide 40 MG tablet Commonly known as: LASIX TAKE 1 TABLET BY MOUTH ONCE DAILY IN THE MORNING   gabapentin 600 MG tablet Commonly known as: NEURONTIN TAKE 1 TABLET BY MOUTH ONCE DAILY IN THE MORNING, AND AT NOON, AND IN THE EVENING, AND AT BEDTIME   Incruse Ellipta 62.5 MCG/ACT Aepb Generic drug: umeclidinium bromide Inhale 1 puff into the lungs daily.   nitrofurantoin (macrocrystal-monohydrate) 100 MG capsule Commonly known as: Macrobid Take 1 capsule (100 mg total) by mouth daily.   nitroGLYCERIN 0.4 MG SL tablet Commonly known as: NITROSTAT Place 1 tablet (0.4 mg total) under the tongue every 5 (five) minutes as needed. CHEST PAIN   oxyCODONE-acetaminophen 10-325 MG tablet Commonly known as: PERCOCET Take 1 tablet  by mouth every 4 (four) hours as needed. PAIN   OXYGEN Inhale 3 L/min into the lungs continuous.   pantoprazole 40 MG tablet Commonly known as: PROTONIX Take 1 tablet (40 mg total) by mouth 2 (two) times daily.   promethazine 25 MG tablet Commonly known as: PHENERGAN Take 1 tablet (25 mg total) by mouth every 6 (six) hours as needed for nausea or vomiting.   tiZANidine 4 MG tablet Commonly known as: ZANAFLEX Take 1 tablet by mouth three times daily as needed for muscle spasm   triamcinolone ointment 0.1 % Commonly known as: KENALOG Apply 1 application topically daily as needed (to  affected areas- for psoriasis).   zolpidem 5 MG tablet Commonly known as: AMBIEN TAKE 1 TABLET BY MOUTH EVERY DAY AT BEDTIME AS NEEDED        Allergies:  Allergies  Allergen Reactions   Dabigatran Etexilate Mesylate Other (See Comments)    INTERNAL BLEEDING- Pradaxa   Augmentin [Amoxicillin-Pot Clavulanate] Diarrhea and Nausea Only    Did it involve swelling of the face/tongue/throat, SOB, or low BP? No Did it involve sudden or severe rash/hives, skin peeling, or any reaction on the inside of your mouth or nose? No Did you need to seek medical attention at a hospital or doctor's office? No When did it last happen? 02/2019 If all above answers are "NO", may proceed with cephalosporin use.    Talwin [Pentazocine] Other (See Comments)    Hallucinations     Past Medical History, Surgical history, Social history, and Family History were reviewed and updated.  Review of Systems: All other 10 point review of systems is negative.   Physical Exam:  weight is 90 lb 12.8 oz (41.2 kg). Her oral temperature is 97.3 F (36.3 C) (abnormal). Her blood pressure is 129/65 and her pulse is 85. Her respiration is 19 and oxygen saturation is 98%.   Wt Readings from Last 3 Encounters:  01/23/21 90 lb 12.8 oz (41.2 kg)  01/16/21 91 lb 12.8 oz (41.6 kg)  12/20/20 91 lb 4 oz (41.4 kg)    Ocular: Sclerae unicteric, pupils equal, round and reactive to light Ear-nose-throat: Oropharynx clear, dentition fair Lymphatic: No cervical or supraclavicular adenopathy Lungs no rales or rhonchi, good excursion bilaterally Heart regular rate and rhythm, no murmur appreciated Abd soft, nontender, positive bowel sounds MSK no focal spinal tenderness, no joint edema Neuro: non-focal, well-oriented, appropriate affect Breasts: Deferred   Lab Results  Component Value Date   WBC 6.5 01/23/2021   HGB 11.5 (L) 01/23/2021   HCT 35.9 (L) 01/23/2021   MCV 89.1 01/23/2021   PLT 228 01/23/2021   Lab Results   Component Value Date   FERRITIN 453 (H) 12/22/2020   IRON 68 12/22/2020   TIBC 231 (L) 12/22/2020   UIBC 163 12/22/2020   IRONPCTSAT 29 12/22/2020   Lab Results  Component Value Date   RETICCTPCT 0.6 11/22/2020   RBC 4.03 01/23/2021   RETICCTABS 64.6 04/01/2009   No results found for: KPAFRELGTCHN, LAMBDASER, KAPLAMBRATIO No results found for: IGGSERUM, IGA, IGMSERUM No results found for: Odetta Pink, SPEI   Chemistry      Component Value Date/Time   NA 138 01/16/2021 1422   NA 135 06/16/2019 1333   K 4.4 01/16/2021 1422   CL 98 01/16/2021 1422   CO2 33 (H) 01/16/2021 1422   BUN 20 01/16/2021 1422   BUN 31 (H) 06/16/2019 1333   CREATININE 0.94 01/16/2021 1422  CREATININE 0.97 12/22/2020 1114   CREATININE 0.91 03/16/2016 1539      Component Value Date/Time   CALCIUM 8.6 01/16/2021 1422   ALKPHOS 118 (H) 01/16/2021 1422   AST 36 01/16/2021 1422   AST 34 12/22/2020 1114   ALT 23 01/16/2021 1422   ALT 19 12/22/2020 1114   BILITOT 0.4 01/16/2021 1422   BILITOT 0.3 12/22/2020 1114       Impression and Plan: Ms. Artiga is a very pleasant 75 yo caucasian female with iron deficiency anemia secondary to malabsorption and possible GI blood loss with Eliquis.  Iron studies are pending. We will replace if needed.  No ESA needed this visit, Hgb 11.5.  Monthly lab and injection and follow-up in 3 months.  She can contact our office with any questions or concerns.   Lottie Dawson, NP 11/7/202211:41 AM

## 2021-01-23 NOTE — Patient Instructions (Signed)

## 2021-01-24 ENCOUNTER — Telehealth: Payer: Self-pay | Admitting: *Deleted

## 2021-01-24 LAB — IRON AND TIBC
Iron: 48 ug/dL (ref 41–142)
Saturation Ratios: 21 % (ref 21–57)
TIBC: 231 ug/dL — ABNORMAL LOW (ref 236–444)
UIBC: 183 ug/dL (ref 120–384)

## 2021-01-24 LAB — FERRITIN: Ferritin: 467 ng/mL — ABNORMAL HIGH (ref 11–307)

## 2021-01-24 NOTE — Telephone Encounter (Signed)
Per 01/23/21 los - called and lvm of upcoming appointments - requested call back to confirm - mailed calendar

## 2021-01-30 ENCOUNTER — Ambulatory Visit: Payer: HMO | Admitting: Internal Medicine

## 2021-02-06 ENCOUNTER — Other Ambulatory Visit: Payer: Self-pay | Admitting: Family Medicine

## 2021-02-06 DIAGNOSIS — R52 Pain, unspecified: Secondary | ICD-10-CM

## 2021-02-06 MED ORDER — OXYCODONE-ACETAMINOPHEN 10-325 MG PO TABS: 1.0000 | ORAL_TABLET | ORAL | 0 refills | Status: DC | PRN

## 2021-02-06 NOTE — Telephone Encounter (Signed)
Medication: oxyCODONE-acetaminophen (PERCOCET) 10-325 MG tablet   Has the patient contacted their pharmacy? No. (If no, request that the patient contact the pharmacy for the refill.) (If yes, when and what did the pharmacy advise?)  Preferred Pharmacy (with phone number or street name):  [facility], [address], [place] [address]  Phone:  [phone]  Fax:  [phone] Agent:  Please be advised that RX refills may take up to 3 business days. We ask that you follow-up with your pharmacy.

## 2021-02-06 NOTE — Telephone Encounter (Signed)
Requesting: Percocet Contract: 04/25/20 UDS: 04/25/20  Last OV: 01/16/21 Next OV: 04/18/21 Last Refill: 01/08/21, #[id number] RF Database:   Please advise

## 2021-02-07 ENCOUNTER — Telehealth: Payer: Self-pay | Admitting: Family Medicine

## 2021-02-07 DIAGNOSIS — R52 Pain, unspecified: Secondary | ICD-10-CM

## 2021-02-07 MED ORDER — GABAPENTIN 600 MG PO TABS: ORAL_TABLET | ORAL | 0 refills | Status: DC

## 2021-02-07 NOTE — Telephone Encounter (Signed)
Medication: gabapentin (NEURONTIN) 600 MG tablet   Has the patient contacted their pharmacy? No. (If no, request that the patient contact the pharmacy for the refill.) (If yes, when and what did the pharmacy advise?)  Preferred Pharmacy (with phone number or street name): [facility], [address], [place] [address]  Phone:  [phone]  Fax:  [phone]   Agent: Please be advised that RX refills may take up to 3 business days. We ask that you follow-up with your pharmacy.

## 2021-02-07 NOTE — Telephone Encounter (Signed)
Refill sent.

## 2021-02-14 ENCOUNTER — Ambulatory Visit (INDEPENDENT_AMBULATORY_CARE_PROVIDER_SITE_OTHER): Payer: HMO | Admitting: Pharmacist

## 2021-02-14 DIAGNOSIS — I480 Paroxysmal atrial fibrillation: Secondary | ICD-10-CM

## 2021-02-14 DIAGNOSIS — M810 Age-related osteoporosis without current pathological fracture: Secondary | ICD-10-CM

## 2021-02-14 DIAGNOSIS — I10 Essential (primary) hypertension: Secondary | ICD-10-CM

## 2021-02-14 DIAGNOSIS — J439 Emphysema, unspecified: Secondary | ICD-10-CM

## 2021-02-14 DIAGNOSIS — E785 Hyperlipidemia, unspecified: Secondary | ICD-10-CM

## 2021-02-14 MED ORDER — FUROSEMIDE 40 MG PO TABS: ORAL_TABLET | ORAL | 0 refills | Status: AC

## 2021-02-14 NOTE — Chronic Care Management (AMB) (Signed)
Chronic Care Management Pharmacy Note  02/14/2021 Name:  [name] MRN:  [id number] DOB:  27-Jan-1946  Subjective: [name] is an 75 y.o. year old female who is a primary patient of Ann Held, DO.  The CCM team was consulted for assistance with disease management and care coordination needs.    Engaged with patient by telephone for  follow up chronic conditions  in response to provider referral for pharmacy case management and/or care coordination services.   Consent to Services:  The patient was given information about Chronic Care Management services, agreed to services, and gave verbal consent prior to initiation of services.  Please see initial visit note for detailed documentation.   Patient Care Team: Carollee Herter, Alferd Apa, DO as PCP - General Minus Breeding, MD as PCP - Cardiology (Cardiology) Deboraha Sprang, MD as PCP - Electrophysiology (Cardiology) Rolm Bookbinder, MD as Consulting Physician (General Surgery) Lafayette Dragon, MD (Inactive) as Consulting Physician (Gastroenterology) Gatha Mayer, MD as Consulting Physician (Gastroenterology) Rutherford Guys, MD as Consulting Physician (Ophthalmology) Crista Luria, MD as Consulting Physician (Dermatology) Deboraha Sprang, MD as Consulting Physician (Cardiology) Minus Breeding, MD as Consulting Physician (Cardiology) Cherre Robins, RPH-CPP (Pharmacist)  Recent office visits: 01/16/2021 - Fam Med (Dr Carollee Herter) F/U back pain and fibromylgia. No med changes. Received flu vaccine. 07/22/2020 - PCP (Dr Etter Sjogren) - video visit to f/u fibromyalgia. No medication changes  Recent consult visits: 11/07/022 - Hematology Eulas Post, NP) Seen for iron Deficiency anemia and erythropoietin deficiency anemia. Labs checked. Continue Iron infusion and EPO administration as indicated. 12/20/2020 - GI (Dr Carlean Purl) Seen for GERD and SBO. Refilled promethazine 51m every 6 hours as needed for nausea and pantoprozole  468mbid.  11/22/2020 - Hematology - Received IV iron infusion and EPO 10/18/2020 - EPO administration and Port A Cath flush 09/28/20- ID (Dr SnBaxter Flattery follow up of pulmonary mycobacteriu avium-intracellulare infection. Continue observation for bronchiectasis/pulmonary MAC. Started nitrofurantoin 100 mg daily, follow up 4 months 08/24/2020 - ID (Dr SnBaxter Flattery F/U MAC infections. Patient noted weight loss and increased croupy cough. Noted bronchiectasis flare. Prescribed Amoxicillin 50064m times a day for 7 days; Stopped MAC antibiotics to give a break to see if can increase her weight; Recommended Ensure or protein drink to increase weight.  08/17/2020 - Hematology. Patient was given retacrit today per 5/4 lab work HGB at 10.9  Hospital visits: None in previous 6 months  Objective:  Lab Results  Component Value Date   CREATININE 0.96 01/23/2021   CREATININE 0.94 01/16/2021   CREATININE 0.97 12/22/2020    Lab Results  Component Value Date   HGBA1C 5.2 05/31/2017   Last diabetic Eye exam: No results found for: HMDIABEYEEXA  Last diabetic Foot exam: No results found for: HMDIABFOOTEX      Component Value Date/Time   CHOL 135 01/16/2021 1422   TRIG 79.0 01/16/2021 1422   TRIG 76 03/11/2006 1059   HDL 53.10 01/16/2021 1422   CHOLHDL 3 01/16/2021 1422   VLDL 15.8 01/16/2021 1422   LDLCALC 66 01/16/2021 1422    Hepatic Function Latest Ref Rng & Units 01/23/2021 01/16/2021 12/22/2020  Total Protein 6.5 - 8.1 g/dL 6.9 7.0 6.7  Albumin 3.5 - 5.0 g/dL 4.3 4.4 4.1  AST 15 - 41 U/L 22 36 34  ALT 0 - 44 U/L _0 Alk Phosphatase 38 - 126 U/L 109 118(H) 118  Total Bilirubin 0.3 - 1.2 mg/dL 0.4 0.4  0.3  Bilirubin, Direct 0.0 - 0.3 mg/dL - - -    Lab Results  Component Value Date/Time   TSH 7.708 (H) 07/20/2019 08:05 PM   TSH 4.43 06/26/2010 01:55 PM   TSH 1.05 02/15/2009 10:23 AM   FREET4 0.68 07/20/2019 08:30 PM    CBC Latest Ref Rng & Units 01/23/2021 12/22/2020 11/22/2020  WBC  4.0 - 10.5 K/uL 6.5 5.5 5.5  Hemoglobin 12.0 - 15.0 g/dL 11.5(L) 10.9(L) 10.4(L)  Hematocrit 36.0 - 46.0 % 35.9(L) 34.4(L) 32.9(L)  Platelets 150 - 400 K/uL 228 205 212    Lab Results  Component Value Date/Time   VD25OH 16.46 (L) 09/06/2015 02:16 PM   VD25OH 4.8 (L) 05/20/2014 03:33 PM   VD25OH 15 (L) 12/12/2011 06:45 PM    Clinical ASCVD: Yes  The 10-year ASCVD risk score (Arnett DK, et al., 2019) is: 23.1%   Values used to calculate the score:     Age: 45 years     Sex: Female     Is Non-Hispanic African American: No     Diabetic: No     Tobacco smoker: No     Systolic Blood Pressure: 102 mmHg     Is BP treated: Yes     HDL Cholesterol: 53.1 mg/dL     Total Cholesterol: 135 mg/dL    Social History   Tobacco Use  Smoking Status Former   Packs/day: 1.00   Years: 20.00   Pack years: 20.00   Types: Cigarettes   Quit date: 03/19/1986   Years since quitting: 34.9  Smokeless Tobacco Never   BP Readings from Last 3 Encounters:  01/23/21 129/65  01/16/21 (!) 125/102  12/22/20 (!) 139/59   Pulse Readings from Last 3 Encounters:  01/23/21 85  01/16/21 90  12/22/20 75   Wt Readings from Last 3 Encounters:  01/23/21 90 lb 12.8 oz (41.2 kg)  01/16/21 91 lb 12.8 oz (41.6 kg)  12/20/20 91 lb 4 oz (41.4 kg)    Assessment: Review of patient past medical history, allergies, medications, health status, including review of consultants reports, laboratory and other test data, was performed as part of comprehensive evaluation and provision of chronic care management services.   SDOH:  (Social Determinants of Health) assessments and interventions performed:  SDOH Interventions    Flowsheet Row Most Recent Value  SDOH Interventions   Financial Strain Interventions Other (Comment)  [see above comments]        CCM Care Plan  Allergies  Allergen Reactions   Dabigatran Etexilate Mesylate Other (See Comments)    INTERNAL BLEEDING- Pradaxa   Augmentin [Amoxicillin-Pot  Clavulanate] Diarrhea and Nausea Only    Did it involve swelling of the face/tongue/throat, SOB, or low BP? No Did it involve sudden or severe rash/hives, skin peeling, or any reaction on the inside of your mouth or nose? No Did you need to seek medical attention at a hospital or doctor's office? No When did it last happen? 02/2019 If all above answers are "NO", may proceed with cephalosporin use.    Talwin [Pentazocine] Other (See Comments)    Hallucinations     Medications Reviewed Today     Reviewed by Cherre Robins, RPH-CPP (Pharmacist) on 02/14/21 at 62  Med List Status: <None>   Medication Order Taking? Sig Documenting Provider Last Dose Status Informant  acetaminophen (TYLENOL) 650 MG CR tablet [id number] Yes Take 650 mg by mouth every 8 (eight) hours as needed for pain. [provider] Taking Active Spouse/Significant Other  albuterol (VENTOLIN HFA) 108 (90 Base) MCG/ACT inhaler [id number] Yes Inhale 2 puffs into the lungs every 6 (six) hours as needed for wheezing or shortness of breath. [provider] Taking Active Spouse/Significant Other           Med Note Antony Contras, Laurieann Friddle B   Mon Jun 20, 2020 11:19 AM) No longer getting from PAP - now available generically  amLODipine (NORVASC) 2.5 MG tablet [id number] Yes TAKE 1 TABLET BY MOUTH AT BEDTIME Minus Breeding, MD Taking Active   apixaban (ELIQUIS) 5 MG TABS tablet [id number] Yes Take 1 tablet (5 mg total) by mouth 2 (two) times daily. Ann Held, DO Taking Active            Med Note Antony Contras, [place] Sep 27, 2020 10:56 AM) Approved to received thru [facility] PAP from 09/08/2020 thru 03/18/2021  atorvastatin (LIPITOR) 20 MG tablet [id number] Yes TAKE 1 TABLET BY MOUTH ONCE DAILY AT  [address], Kendrick Fries R, DO Taking Active   furosemide (LASIX) 40 MG tablet [id number] Yes TAKE 1 TABLET BY MOUTH ONCE DAILY IN THE [name], MD Taking Active   gabapentin (NEURONTIN) 600 MG tablet  [id number] Yes TAKE 1 TABLET BY MOUTH ONCE DAILY IN THE MORNING, AND AT NOON, AND IN THE EVENING, AND AT BEDTIME Roma Schanz R, DO Taking Active     Discontinued 06/05/11 1433 (Error) nitrofurantoin, macrocrystal-monohydrate, (MACROBID) 100 MG capsule [id number] Yes Take 1 capsule (100 mg total) by mouth daily. Carlyle Basques, MD Taking Active   nitroGLYCERIN (NITROSTAT) 0.4 MG SL tablet [id number] Yes Place 1 tablet (0.4 mg total) under the tongue every 5 (five) minutes as needed. CHEST PAIN Minus Breeding, MD Taking Active            Med Note (DAVIS, SOPHIA A   Tue Dec 20, 2020 11:21 AM) On hand  oxyCODONE-acetaminophen (PERCOCET) 10-325 MG tablet [id number] Yes Take 1 tablet by mouth every 4 (four) hours as needed. PAIN Ann Held, DO Taking Active   OXYGEN [id number] Yes Inhale 3 L/min into the lungs continuous.  [provider] Taking Active Spouse/Significant Other  pantoprazole (PROTONIX) 40 MG tablet [id number] Yes Take 1 tablet (40 mg total) by mouth 2 (two) times daily. Gatha Mayer, MD Taking Active   promethazine (PHENERGAN) 25 MG tablet [id number] Yes Take 1 tablet (25 mg total) by mouth every 6 (six) hours as needed for nausea or vomiting. Gatha Mayer, MD Taking Active   tiZANidine (ZANAFLEX) 4 MG tablet [id number] Yes Take 1 tablet by mouth three times daily as needed for muscle spasm Carollee Herter, Alferd Apa, DO Taking Active   triamcinolone ointment (KENALOG) 0.1 % [id number] Yes Apply 1 application topically daily as needed (to affected areas- for psoriasis).  [provider] Taking Active Spouse/Significant Other  umeclidinium bromide (INCRUSE ELLIPTA) 62.5 MCG/INH AEPB [id number] Yes Inhale 1 puff into the lungs daily. Ann Held, DO Taking Active            Med Note Antony Contras, [name] Jul 26, 2020 11:11 AM) Approved to received [employer] patient assistance 07/13/2020 to 03/18/2021  zolpidem (AMBIEN) 5 MG tablet [id number] Yes TAKE 1  TABLET BY MOUTH EVERY DAY AT BEDTIME AS NEEDED Ann Held, DO Taking Active             Patient Active Problem List   Diagnosis Date Noted   Hematoma 08/07/2019  Other neutropenia ([place])    Post-procedural fever 07/20/2019   Acute febrile illness 07/16/2019   Pulmonary MAI (mycobacterium avium-intracellulare) infection ([place]) 06/24/2019   History of total right hip replacement 05/27/2019   NICM (nonischemic cardiomyopathy) ([place]) 05/19/2019   Educated about COVID-19 virus infection 09/07/2018   Erythropoietin deficiency anemia 06/19/2018   Swelling of calf 02/04/2018   Bad odor of urine 12/09/2017   Iron deficiency anemia due to chronic blood loss 08/14/2017   Iron malabsorption 08/14/2017   Hyperlipidemia LDL goal <100 07/08/2017   Elevated ALT measurement    Hypotension 06/20/2017   HLD (hyperlipidemia) 06/20/2017   CAD (coronary artery disease) 06/20/2017   Protein-calorie malnutrition, severe ([place]) 06/20/2017   Generalized weakness    Weakness 05/30/2017   Focal neurological deficit 05/30/2017   Chronic diastolic CHF (congestive heart failure) ([place]) 05/30/2017   TIA (transient ischemic attack) 05/30/2017   AKI (acute kidney injury) ([place]) 05/30/2017   Abnormal LFTs 05/30/2017   Bronchiectasis ([place]) 04/22/2017   Right otitis media 07/17/2016   Cerumen impaction 07/17/2016   Osteoporosis 03/16/2016   Headache 11/20/2014   Skin infection 05/27/2014   Insomnia 05/27/2014   Abdominal wall pain 07/09/2013   Other dysphagia 02/02/2013   Platelets decreased ([place]) 01/29/2012   Vitamin D deficiency 12/13/2011   Hypokalemia 12/13/2011   Hypocalcemia 12/12/2011   Adhesions of intestine, very dense, with frozen abdomen 12/11/2011   SBO (small bowel obstruction), recurrent 12/11/2011   Protein-calorie malnutrition, moderate ([place]) 12/11/2011   Oxygen dependent    Hx of gastric ulcer    UTI (urinary tract infection) 10/15/2011   Allergic conjunctivitis 10/15/2011    Degenerative arthritis of hip 08/03/2011   B12 deficiency 07/02/2011   Bilateral leg weakness 01/16/2011   Anemia, chronic disease 09/14/2010   Recurrent pneumonia 08/11/2010   CRT- [employer] 06/26/2010   DYSURIA 05/19/2010   Chronic hypoxemic respiratory failure ([place]) 02/21/2010   DEHYDRATION 01/06/2010   GASTROENTERITIS 01/06/2010   Congestive dilated cardiomyopathy ([place]) 11/17/2009   LBBB 95/28/4132   SYSTOLIC HEART FAILURE, CHRONIC 11/17/2009   GERD (gastroesophageal reflux disease) 02/17/2009   NONSPECIFIC ABN FINDNG RAD&OTH EXAM BILARY TRCT 09/03/2008   COPD with emphysema ([place]) 06/28/2008   DIVERTICULOSIS, COLON 06/03/2008   GASTRITIS 06/02/2008   Atrial fibrillation/flutter 12/08/2007   GASTROPARESIS 08/07/2007   NAUSEA, CHRONIC 08/07/2007   Abdominal pain 08/07/2007   SYNCOPE 02/17/2007   RESTLESS LEG SYNDROME 08/01/2006   Chronic pain syndrome 08/01/2006   OSTEOARTHRITIS 07/31/2006   Degenerative disc disease, lumbar 07/31/2006   Fibromyalgia 07/31/2006   CHRONIC FATIGUE SYNDROME 07/31/2006   URINARY INCONTINENCE 07/31/2006   ANEMIA-IRON DEFICIENCY 05/01/2006   Essential hypertension 05/01/2006    Immunization History  Administered Date(s) Administered   DTaP 11/03/1996, 01/04/1997, 05/03/1997, 10/19/1997, 08/05/2001   Fluad Quad(high Dose 65+) 11/06/2018, 12/31/2019, 01/16/2021   HPV Quadrivalent 09/05/2009, 09/13/2010, 09/26/2011   Hepatitis A, Ped/Adol-2 Dose 09/21/2004, 03/29/2005   Hepatitis B, ped/adol 04/30/1996, 11/03/1996, 10/19/1997   HiB (PRP-T) 11/03/1996, 01/04/1997, 05/03/1997, 10/19/1997   IPV 11/03/1996, 01/04/1997, 05/03/1997, 08/05/2001   Influenza Split 01/16/2011, 12/12/2011   Influenza Whole 01/15/2007, 12/08/2007, 12/28/2008, 12/17/2009   Influenza, High Dose Seasonal PF 11/28/2015, 12/07/2016, 11/15/2017   Influenza, Seasonal, Injecte, Preservative Fre 12/09/2012   Influenza,Quad,Nasal, Live 03/17/2007, 03/06/2009, 01/08/2010,  01/30/2011, 02/09/2012, 01/18/2013   Influenza,inj,Quad PF,6+ Mos 03/29/2005, 04/30/2005, 03/01/2006, 01/28/2008, 12/01/2013, 01/16/2014, 12/22/2014, 12/28/2014   MMR 05/03/1997, 08/05/2001   Meningococcal B, OMV 12/22/2014   Pneumococcal Conjugate-13 06/10/2014, 11/15/2014   Pneumococcal Polysaccharide-23 12/17/2009   Tdap  09/17/2007, 05/15/2014, 12/09/2018   Varicella 11/16/1997, 05/18/2008   Zoster, Live 12/31/2013    Conditions to be addressed/monitored: CHF, HTN, HLD, COPD, Pulmonary Disease and GERD, insomnia; chronic pain; CAD  Care Plan : General Pharmacy (Adult)  Updates made by Cherre Robins, RPH-CPP since 02/14/2021 12:00 AM     Problem: Afib, COPD, HF, HTN, HLD, CAD, Chronic Pain Syndrome, GERD, Insomnia   Priority: High  Onset Date: 05/10/2020     Long-Range Goal: Provide education, support and care coordination for medication therapy and chronic conditions   Start Date: 05/10/2020  Expected End Date: 09/23/2020  Recent Progress: On track  Priority: High  Note:   Current Barriers:  Unable to independently afford treatment regimen Chronic Disease Management support, education, and care coordination needs related to Afib, COPD, HF, HTN, HLD, CAD, Chronic Pain Syndrome, GERD, Insomnia Not taking statin with history of CAD and TIA (restarted atorvastatin 11/23/2020)  Pharmacist Clinical Goal(s):  Over the next 90 days, patient will verbalize ability to afford treatment regimen achieve adherence to monitoring guidelines and medication adherence to achieve therapeutic efficacy contact provider office for questions/concerns as evidenced notation of same in electronic health record through collaboration with PharmD and provider.   Interventions: 1:1 collaboration with Carollee Herter, Alferd Apa, DO regarding development and update of comprehensive plan of care as evidenced by provider attestation and co-signature Inter-disciplinary care team collaboration (see longitudinal plan  of care) Comprehensive medication review performed; medication list updated in electronic medical record  Hypertension (BP goal <140/90) Controlled Current treatment: Amlodipine 2.4m daily in the evening Medications previously tried: none noted  Current home readings: checks infrequently. Reports home blood pressure on pats month has been 130-140 / 70's Pharmacist interventions:  Educated on BP goals and benefits of medications for prevention of heart attack, stroke and kidney damage; Reminded about the importance of home blood pressure monitoring; Recommended to continue current medication.    Hyperlipidemia / CAD / history of TIA:  LDL at goal since restarted statin in Spetember 2022; LDL goal <70  Prescribed treatment:  Atorvastatin 223mdaily  Interventions:  Discussed benefits of statin therapy in prevention of stroke and heart attack [facility] (Goal: prevent stroke and major bleeding) Controlled  Current treatment: Anticoagulation: Eliquis 55m90mwice daily (per Dr. LowEtter Sjogrenedications previously tried: amiodarone, carvedilol Patient is in Medicare coverage gap. She is getting Eliquis from patient assistance program through 03/18/2021.  Intervention:  Maintain Eliquis therapy Patient will continue to receive Eliquis from PAP through 03/18/2021. Discussed that we will reapply for Eliquis if needed in 2023 (program requirements do not allow reapplication until reaches Medicare coverage gap and meets 3% out of pocket spend for medications in calendar year. Confirmed with patient received first shipment 09/16/2020; Second shipment received in October 2022. Patient states she has about 60 days of Eliquis left.   COPD with past MAI infection (Goal: control symptoms and prevent exacerbations) Controlled Completed antibiotics for MAI infection in April 2022.  Current treatment  Incruse ellipta 62.55mg57mh once daily (per  Sood) Proventil (per SoodCrab Orcharddications previously tried: none noted  Gold Grade: Gold 2 (FEV1 50-79%) Current COPD Classification:  B (high sx, <2 exacerbations/yr) Pulmonary function testing: 10/10/2011-pulmonary function test-FVC 1.47 (54% predicted), postbronchodilator ratio 64, postbronchodilator FEV1 1.15 (59% predicted), positive bronchodilator response, DLCO 5.3 (35% predicted) Exacerbations requiring treatment in last 6 months: none Patient reports consistent use of maintenance inhaler Frequency of rescue inhaler use: rare - only about 1 or  2 times per month now Pharmacist Interventions:  Recommended continue current inhalers for COPD PAP application for Incruse has been approved. Patient received first shipment in May 2022.  Completed provider portion of patient assistance program application for Incruse for 2023 today. Patient will come by office next week to sign when she has hematology appointment.  Patient is taking nitrofurantoin daily for UTI prevention. Consider risk versus benefits in patient with COPD given that nitrofurantoin is associated with pulmonary injury.   Pain (Goal: Minimize symptoms) Controlled Current treatment  Gabapentin 677m 4 times daily Oxycodone 10-3272m4 times daily Tizanidine 20m72mID (only takes BID) Acetaminophen 325m98mtake 2 tabs PRN - uses every other day Medications previously tried: none noted Pharmacist interventions:  Recommended to continue current medication  Osteoporosis  Goal: reduce risk of fracture due to osteoporosis  Current regimen:  None Interventions: Consider recheck DEXA - order has been place September 2022 by Dr LownCarollee HerterHealth Maintenance  Interventions: Discussed consideration of completing Shingrix vaccine series (would wait until completion of MAI abx treatment) Discussed consideration of completing COVID Vaccine Series Patient self care activities - Over the next year, patient will: Consider completion of  Shingrix vaccine series in 2023 when anticipate coverage thru Medicare will improve Consider completion of COVID Vaccine series - patient declines   Medication management Current pharmacy: [facility] Interventions Comprehensive medication review performed. Reviewed refill history and assessed adherence Continue current medication management strategy [facility] Activities Over the next 90 days, patient will:  take medications as prescribed check blood pressure periodically, document, and provide at future appointments collaborate with provider on medication access solutions Come by office to sign application for Incruse patient assistance program for 2023.   Follow Up Plan: The care management team will reach out to the patient again over the next 90 days.        Medication Assistance: Approved for assistance for Incruse thru [employer] program- received first delivery 07/2020;   Approved for assistance for Eliquis thru [employer] assistance program - received first delivery 09/16/2020. Started application for [employer] / Incruse for 2023 today.  [id number]: [phone]: [phone] Up:  Patient agrees to Care Plan and Follow-up.  Plan: Telephone follow up appointment with care management team member scheduled for:  2 to 3 months  TammCherre RobinsarmD Clinical Pharmacist [place]-[phone]

## 2021-02-14 NOTE — Patient Instructions (Signed)
[name],  It was a pleasure speaking with you today.  I have attached a summary of our visit today and information about your health goals.   Our next appointment is by telephone on May 16, 2021 at 10:00 am  Please call the care guide team at [phone] if you need to cancel or reschedule your appointment.     If you have any questions or concerns, please feel free to contact me either at the phone number below or with a MyChart message.   Keep up the good work!  Cherre Robins, PharmD Clinical Pharmacist [place] [phone] (direct line)  [phone] (main office number)   Patient verbalizes understanding of instructions provided today and agrees to view in [place].     Hypertension (BP goal <140/90) Controlled Current treatment: Amlodipine 2.5mg  daily in the evening Pharmacist interventions:  Educated on blood pressure  goals and benefits of medications for prevention of heart attack, stroke and kidney damage; Reminded about the importance of home blood pressure monitoring; Recommended to continue current medication.    Hyperlipidemia / CAD / history of TIA:  LDL at goal since restarted statin in Spetember 2022; LDL goal <70   Lab Results  Component Value Date   HDL 53.10 01/16/2021   HDL 29 (L) 05/11/2019   HDL 36.80 (L) 08/18/2018  Prescribed treatment:  Atorvastatin 20mg  daily  Interventions:  Discussed benefits of statin therapy in prevention of stroke and heart attack [facility]atin [facility]  Atrial Fibrillation (Goal: prevent stroke and major bleeding) Controlled  Current treatment: Anticoagulation: Eliquis 5mg  twice daily (per Dr. Etter Sjogren) Medications previously tried: amiodarone, carvedilol Patient is in Medicare coverage gap. She is getting Eliquis from patient assistance program through 03/18/2021.  Intervention:  Maintain Eliquis therapy Patient will continue to receive Eliquis  from PAP through 03/18/2021. Discussed that we will reapply for Eliquis if needed in 2023 (program requirements do not allow reapplication until reaches Medicare coverage gap and meets 3% out of pocket spend for medications in calendar year.  COPD with past MAI infection (Goal: control symptoms and prevent exacerbations) Controlled Current treatment  Incruse ellipta 62.5mg /inh once daily (per Halford Chessman) Proventil (per [place]) Pharmacist Interventions:  Recommended continue current inhalers for COPD Completed provider portion of patient assistance program application for Incruse for 2023 today. Patient will come by office next week to sign when she has hematology appointment.   Pain (Goal: Minimize symptoms) Controlled Current treatment  Gabapentin 600mg  4 times daily Oxycodone 10-325mg  4 times daily Tizanidine 4mg  3 times a day (only takes twice a day) Acetaminophen 325mg  - take 2 tabs PRN - uses every other day Medications previously tried: none noted Pharmacist interventions:  Recommended to continue current medication  Osteoporosis  Goal: reduce risk of fracture due to osteoporosis  Current regimen:  None Interventions: Consider recheck DEXA  Health Maintenance  Interventions: Discussed consideration of completing Shingrix vaccine series (would wait until completion of MAI abx treatment) Discussed consideration of completing COVID Vaccine Series Patient self care activities - Over the next year, patient will: Consider completion of Shingrix vaccine series in 2023 when anticipate coverage thru Medicare will improve Consider completion of COVID Vaccine series - patient declines   Medication management Current pharmacy: [facility] Interventions Comprehensive medication review performed. Reviewed refill history and assessed adherence Continue current medication management strategy Coordinated refills for atorvastatin and furosemide at [facility].  Patient Goals/Self-Care  Activities Over the next 90 days, patient will:  take medications as  prescribed check blood pressure periodically, document, and provide at future appointments collaborate with provider on medication access solutions Come by office to sign application for Incruse patient assistance program for 2023.   Follow Up Plan: The care management team will reach out to the patient again over the next 90 days.

## 2021-02-15 DIAGNOSIS — I10 Essential (primary) hypertension: Secondary | ICD-10-CM | POA: Diagnosis not present

## 2021-02-15 DIAGNOSIS — J439 Emphysema, unspecified: Secondary | ICD-10-CM

## 2021-02-15 DIAGNOSIS — I480 Paroxysmal atrial fibrillation: Secondary | ICD-10-CM

## 2021-02-15 DIAGNOSIS — E785 Hyperlipidemia, unspecified: Secondary | ICD-10-CM | POA: Diagnosis not present

## 2021-02-15 DIAGNOSIS — M810 Age-related osteoporosis without current pathological fracture: Secondary | ICD-10-CM

## 2021-02-22 ENCOUNTER — Inpatient Hospital Stay: Payer: HMO | Attending: Family

## 2021-02-22 ENCOUNTER — Other Ambulatory Visit: Payer: Self-pay

## 2021-02-22 ENCOUNTER — Inpatient Hospital Stay: Payer: HMO

## 2021-02-22 VITALS — BP 154/65 | HR 80 | Temp 98.7°F | Resp 17

## 2021-02-22 DIAGNOSIS — N183 Chronic kidney disease, stage 3 unspecified: Secondary | ICD-10-CM | POA: Insufficient documentation

## 2021-02-22 DIAGNOSIS — D500 Iron deficiency anemia secondary to blood loss (chronic): Secondary | ICD-10-CM

## 2021-02-22 DIAGNOSIS — D631 Anemia in chronic kidney disease: Secondary | ICD-10-CM | POA: Insufficient documentation

## 2021-02-22 DIAGNOSIS — D509 Iron deficiency anemia, unspecified: Secondary | ICD-10-CM | POA: Insufficient documentation

## 2021-02-22 LAB — CMP (CANCER CENTER ONLY)
ALT: 17 U/L (ref 0–44)
AST: 19 U/L (ref 15–41)
Albumin: 4 g/dL (ref 3.5–5.0)
Alkaline Phosphatase: 122 U/L (ref 38–126)
Anion gap: 7 (ref 5–15)
BUN: 19 mg/dL (ref 8–23)
CO2: 31 mmol/L (ref 22–32)
Calcium: 8.9 mg/dL (ref 8.9–10.3)
Chloride: 100 mmol/L (ref 98–111)
Creatinine: 1.01 mg/dL — ABNORMAL HIGH (ref 0.44–1.00)
GFR, Estimated: 58 mL/min — ABNORMAL LOW (ref >60)
Glucose, Bld: 101 mg/dL — ABNORMAL HIGH (ref 70–99)
Potassium: 4.6 mmol/L (ref 3.5–5.1)
Sodium: 138 mmol/L (ref 135–145)
Total Bilirubin: 0.3 mg/dL (ref 0.3–1.2)
Total Protein: 7 g/dL (ref 6.5–8.1)

## 2021-02-22 LAB — CBC WITH DIFFERENTIAL (CANCER CENTER ONLY)
Abs Immature Granulocytes: 0.03 10*3/uL (ref 0.00–0.07)
Basophils Absolute: 0.1 10*3/uL (ref 0.0–0.1)
Basophils Relative: 1 %
Eosinophils Absolute: 0.2 10*3/uL (ref 0.0–0.5)
Eosinophils Relative: 3 %
HCT: 33.2 % — ABNORMAL LOW (ref 36.0–46.0)
Hemoglobin: 10.6 g/dL — ABNORMAL LOW (ref 12.0–15.0)
Immature Granulocytes: 0 %
Lymphocytes Relative: 9 %
Lymphs Abs: 0.7 10*3/uL (ref 0.7–4.0)
MCH: 28.6 pg (ref 26.0–34.0)
MCHC: 31.9 g/dL (ref 30.0–36.0)
MCV: 89.7 fL (ref 80.0–100.0)
Monocytes Absolute: 0.7 10*3/uL (ref 0.1–1.0)
Monocytes Relative: 9 %
Neutro Abs: 5.7 10*3/uL (ref 1.7–7.7)
Neutrophils Relative %: 78 %
Platelet Count: 268 10*3/uL (ref 150–400)
RBC: 3.7 MIL/uL — ABNORMAL LOW (ref 3.87–5.11)
RDW: 15.4 % (ref 11.5–15.5)
WBC Count: 7.2 10*3/uL (ref 4.0–10.5)
nRBC: 0 % (ref 0.0–0.2)

## 2021-02-22 LAB — RETICULOCYTES
Immature Retic Fract: 8.3 % (ref 2.3–15.9)
RBC.: 3.66 MIL/uL — ABNORMAL LOW (ref 3.87–5.11)
Retic Count, Absolute: 37.3 10*3/uL (ref 19.0–186.0)
Retic Ct Pct: 1 % (ref 0.4–3.1)

## 2021-02-22 MED ORDER — EPOETIN ALFA-EPBX 40000 UNIT/ML IJ SOLN
40000.0000 [IU] | Freq: Once | INTRAMUSCULAR | Status: AC
  Administered 2021-02-22: 40000 [IU] via SUBCUTANEOUS
  Filled 2021-02-22: qty 1

## 2021-02-22 MED ORDER — ALTEPLASE 2 MG IJ SOLR: 2.0000 mg | Freq: Once | INTRAMUSCULAR | Status: DC | PRN

## 2021-02-22 MED ORDER — HEPARIN SOD (PORK) LOCK FLUSH 100 UNIT/ML IV SOLN
500.0000 [IU] | Freq: Once | INTRAVENOUS | Status: AC | PRN
  Administered 2021-02-22: 500 [IU]

## 2021-02-22 MED ORDER — SODIUM CHLORIDE 0.9% FLUSH
10.0000 mL | INTRAVENOUS | Status: DC | PRN
  Administered 2021-02-22: 10 mL

## 2021-02-22 NOTE — Patient Instructions (Signed)

## 2021-02-22 NOTE — Patient Instructions (Signed)
Epoetin Alfa injection What is this medication? EPOETIN ALFA (e POE e tin AL fa) helps your body make more red blood cells. This medicine is used to treat anemia caused by chronic kidney disease, cancer chemotherapy, or HIV-therapy. It may also be used before surgery if you have anemia. This medicine may be used for other purposes; ask your health care provider or pharmacist if you have questions. COMMON BRAND NAME(S): Epogen, Procrit, Retacrit What should I tell my care team before I take this medication? They need to know if you have any of these conditions: cancer heart disease high blood pressure history of blood clots history of stroke low levels of folate, iron, or vitamin B12 in the blood seizures an unusual or allergic reaction to erythropoietin, albumin, benzyl alcohol, hamster proteins, other medicines, foods, dyes, or preservatives pregnant or trying to get pregnant breast-feeding How should I use this medication? This medicine is for injection into a vein or under the skin. It is usually given by a health care professional in a hospital or clinic setting. If you get this medicine at home, you will be taught how to prepare and give this medicine. Use exactly as directed. Take your medicine at regular intervals. Do not take your medicine more often than directed. It is important that you put your used needles and syringes in a special sharps container. Do not put them in a trash can. If you do not have a sharps container, call your pharmacist or healthcare provider to get one. A special MedGuide will be given to you by the pharmacist with each prescription and refill. Be sure to read this information carefully each time. Talk to your pediatrician regarding the use of this medicine in children. While this drug may be prescribed for selected conditions, precautions do apply. Overdosage: If you think you have taken too much of this medicine contact a poison control center or emergency  room at once. NOTE: This medicine is only for you. Do not share this medicine with others. What if I miss a dose? If you miss a dose, take it as soon as you can. If it is almost time for your next dose, take only that dose. Do not take double or extra doses. What may interact with this medication? Interactions have not been studied. This list may not describe all possible interactions. Give your health care provider a list of all the medicines, herbs, non-prescription drugs, or dietary supplements you use. Also tell them if you smoke, drink alcohol, or use illegal drugs. Some items may interact with your medicine. What should I watch for while using this medication? Your condition will be monitored carefully while you are receiving this medicine. You may need blood work done while you are taking this medicine. This medicine may cause a decrease in vitamin B6. You should make sure that you get enough vitamin B6 while you are taking this medicine. Discuss the foods you eat and the vitamins you take with your health care professional. What side effects may I notice from receiving this medication? Side effects that you should report to your doctor or health care professional as soon as possible: allergic reactions like skin rash, itching or hives, swelling of the face, lips, or tongue seizures signs and symptoms of a blood clot such as breathing problems; changes in vision; chest pain; severe, sudden headache; pain, swelling, warmth in the leg; trouble speaking; sudden numbness or weakness of the face, arm or leg signs and symptoms of a stroke like   changes in vision; confusion; trouble speaking or understanding; severe headaches; sudden numbness or weakness of the face, arm or leg; trouble walking; dizziness; loss of balance or coordination Side effects that usually do not require medical attention (report to your doctor or health care professional if they continue or are  bothersome): chills cough dizziness fever headaches joint pain muscle cramps muscle pain nausea, vomiting pain, redness, or irritation at site where injected This list may not describe all possible side effects. Call your doctor for medical advice about side effects. You may report side effects to FDA at 1-800-FDA-1088. Where should I keep my medication? Keep out of the reach of children. Store in a refrigerator between 2 and 8 degrees C (36 and 46 degrees F). Do not freeze or shake. Throw away any unused portion if using a single-dose vial. Multi-dose vials can be kept in the refrigerator for up to 21 days after the initial dose. Throw away unused medicine. NOTE: This sheet is a summary. It may not cover all possible information. If you have questions about this medicine, talk to your doctor, pharmacist, or health care provider.  2022 Elsevier/Gold Standard (2016-10-12 08:35:19)  

## 2021-02-23 LAB — IRON AND TIBC
Iron: 37 ug/dL — ABNORMAL LOW (ref 41–142)
Saturation Ratios: 16 % — ABNORMAL LOW (ref 21–57)
TIBC: 239 ug/dL (ref 236–444)
UIBC: 202 ug/dL (ref 120–384)

## 2021-02-23 LAB — FERRITIN: Ferritin: 364 ng/mL — ABNORMAL HIGH (ref 11–307)

## 2021-02-24 ENCOUNTER — Other Ambulatory Visit: Payer: Self-pay | Admitting: Family

## 2021-02-27 ENCOUNTER — Inpatient Hospital Stay: Payer: HMO

## 2021-02-27 ENCOUNTER — Other Ambulatory Visit: Payer: Self-pay

## 2021-02-27 VITALS — BP 132/65 | HR 71 | Resp 16

## 2021-02-27 DIAGNOSIS — D500 Iron deficiency anemia secondary to blood loss (chronic): Secondary | ICD-10-CM

## 2021-02-27 DIAGNOSIS — N183 Chronic kidney disease, stage 3 unspecified: Secondary | ICD-10-CM | POA: Diagnosis not present

## 2021-02-27 MED ORDER — SODIUM CHLORIDE 0.9 % IV SOLN
200.0000 mg | Freq: Once | INTRAVENOUS | Status: DC
  Filled 2021-02-27: qty 10

## 2021-02-27 MED ORDER — SODIUM CHLORIDE 0.9% FLUSH
3.0000 mL | Freq: Once | INTRAVENOUS | Status: AC | PRN
  Administered 2021-02-27: 10 mL

## 2021-02-27 MED ORDER — EPINEPHRINE 0.3 MG/0.3ML IJ SOAJ: 0.3000 mg | Freq: Once | INTRAMUSCULAR | Status: DC | PRN

## 2021-02-27 MED ORDER — SODIUM CHLORIDE 0.9 % IV SOLN: Freq: Once | INTRAVENOUS | Status: DC | PRN

## 2021-02-27 MED ORDER — SODIUM CHLORIDE 0.9 % IV SOLN
Freq: Once | INTRAVENOUS | Status: AC
Start: 2021-02-27 — End: 2021-02-27

## 2021-02-27 MED ORDER — METHYLPREDNISOLONE SODIUM SUCC 125 MG IJ SOLR: 125.0000 mg | Freq: Once | INTRAMUSCULAR | Status: DC | PRN

## 2021-02-27 MED ORDER — DIPHENHYDRAMINE HCL 50 MG/ML IJ SOLN: 50.0000 mg | Freq: Once | INTRAMUSCULAR | Status: DC | PRN

## 2021-02-27 MED ORDER — FAMOTIDINE IN NACL 20-0.9 MG/50ML-% IV SOLN: 20.0000 mg | Freq: Once | INTRAVENOUS | Status: DC | PRN

## 2021-02-27 MED ORDER — SODIUM CHLORIDE 0.9 % IV SOLN
200.0000 mg | Freq: Once | INTRAVENOUS | Status: AC
  Administered 2021-02-27: 200 mg via INTRAVENOUS
  Filled 2021-02-27: qty 200

## 2021-02-27 MED ORDER — ALTEPLASE 2 MG IJ SOLR: 2.0000 mg | Freq: Once | INTRAMUSCULAR | Status: DC | PRN

## 2021-02-27 MED ORDER — HEPARIN SOD (PORK) LOCK FLUSH 100 UNIT/ML IV SOLN
250.0000 [IU] | Freq: Once | INTRAVENOUS | Status: AC | PRN
  Administered 2021-02-27: 500 [IU]

## 2021-02-27 MED ORDER — ALBUTEROL SULFATE (2.5 MG/3ML) 0.083% IN NEBU: 2.5000 mg | INHALATION_SOLUTION | Freq: Once | RESPIRATORY_TRACT | Status: DC | PRN

## 2021-02-27 NOTE — Progress Notes (Signed)
Pt declined to stay for post infusion observation period. Pt stated she has tolerated medication multiple times prior without difficulty. Pt aware to call clinic with any questions or concerns. Pt verbalized understanding and had no further questions.  ? ?

## 2021-02-27 NOTE — Patient Instructions (Signed)

## 2021-03-02 ENCOUNTER — Telehealth: Payer: HMO

## 2021-03-03 ENCOUNTER — Other Ambulatory Visit: Payer: Self-pay | Admitting: Pharmacist

## 2021-03-03 ENCOUNTER — Ambulatory Visit (INDEPENDENT_AMBULATORY_CARE_PROVIDER_SITE_OTHER): Payer: HMO | Admitting: Pharmacist

## 2021-03-03 DIAGNOSIS — R52 Pain, unspecified: Secondary | ICD-10-CM

## 2021-03-03 DIAGNOSIS — J439 Emphysema, unspecified: Secondary | ICD-10-CM

## 2021-03-03 DIAGNOSIS — I10 Essential (primary) hypertension: Secondary | ICD-10-CM

## 2021-03-03 DIAGNOSIS — M797 Fibromyalgia: Secondary | ICD-10-CM

## 2021-03-03 DIAGNOSIS — M810 Age-related osteoporosis without current pathological fracture: Secondary | ICD-10-CM

## 2021-03-03 MED ORDER — OXYCODONE-ACETAMINOPHEN 10-325 MG PO TABS: 1.0000 | ORAL_TABLET | ORAL | 0 refills | Status: DC | PRN

## 2021-03-03 MED ORDER — UMECLIDINIUM BROMIDE 62.5 MCG/ACT IN AEPB: 1.0000 | INHALATION_SPRAY | Freq: Every day | RESPIRATORY_TRACT | 3 refills | Status: AC

## 2021-03-03 MED ORDER — GABAPENTIN 600 MG PO TABS
ORAL_TABLET | ORAL | 0 refills | Status: DC
Start: 2021-03-03 — End: 2021-04-04

## 2021-03-03 NOTE — Chronic Care Management (AMB) (Signed)
Chronic Care Management Pharmacy Note  03/03/2021 Name:  [name] MRN:  [id number] DOB:  1945/09/06  Subjective: [name] is an 75 y.o. year old female who is a primary patient of Ann Held, DO.  The CCM team was consulted for assistance with disease management and care coordination needs.    Engaged with patient by telephone for  follow up chronic conditions  in response to provider referral for pharmacy case management and/or care coordination services.   Consent to Services:  The patient was given information about Chronic Care Management services, agreed to services, and gave verbal consent prior to initiation of services.  Please see initial visit note for detailed documentation.   Patient Care Team: Carollee Herter, Alferd Apa, DO as PCP - General Minus Breeding, MD as PCP - Cardiology (Cardiology) Deboraha Sprang, MD as PCP - Electrophysiology (Cardiology) Rolm Bookbinder, MD as Consulting Physician (General Surgery) Lafayette Dragon, MD (Inactive) as Consulting Physician (Gastroenterology) Gatha Mayer, MD as Consulting Physician (Gastroenterology) Rutherford Guys, MD as Consulting Physician (Ophthalmology) Crista Luria, MD as Consulting Physician (Dermatology) Deboraha Sprang, MD as Consulting Physician (Cardiology) Minus Breeding, MD as Consulting Physician (Cardiology) Cherre Robins, RPH-CPP (Pharmacist)  Recent office visits: 01/16/2021 - Fam Med (Dr Carollee Herter) F/U back pain and fibromylgia. No med changes. Received flu vaccine.  Recent consult visits: 11/07/022 - Hematology Eulas Post, NP) Seen for iron Deficiency anemia and erythropoietin deficiency anemia. Labs checked. Continue Iron infusion and EPO administration as indicated. 12/20/2020 - GI (Dr Carlean Purl) Seen for GERD and SBO. Refilled promethazine 23m every 6 hours as needed for nausea and pantoprozole 451mbid.  11/22/2020 - Hematology - Received IV iron infusion and EPO 10/18/2020 -  EPO administration and Port A Cath flush 09/28/20- ID (Dr SnBaxter Flattery follow up of pulmonary mycobacteriu avium-intracellulare infection. Continue observation for bronchiectasis/pulmonary MAC. Started nitrofurantoin 100 mg daily, follow up 4 months   Hospital visits: None in previous 6 months  Objective:  Lab Results  Component Value Date   CREATININE 1.01 (H) 02/22/2021   CREATININE 0.96 01/23/2021   CREATININE 0.94 01/16/2021    Lab Results  Component Value Date   HGBA1C 5.2 05/31/2017   Last diabetic Eye exam: No results found for: HMDIABEYEEXA  Last diabetic Foot exam: No results found for: HMDIABFOOTEX      Component Value Date/Time   CHOL 135 01/16/2021 1422   TRIG 79.0 01/16/2021 1422   TRIG 76 03/11/2006 1059   HDL 53.10 01/16/2021 1422   CHOLHDL 3 01/16/2021 1422   VLDL 15.8 01/16/2021 1422   LDLCALC 66 01/16/2021 1422    Hepatic Function Latest Ref Rng & Units 02/22/2021 01/23/2021 01/16/2021  Total Protein 6.5 - 8.1 g/dL 7.0 6.9 7.0  Albumin 3.5 - 5.0 g/dL 4.0 4.3 4.4  AST 15 - 41 U/L 19 22 36  ALT 0 - 44 U/L _0 Alk Phosphatase 38 - 126 U/L 122 109 118(H)  Total Bilirubin 0.3 - 1.2 mg/dL 0.3 0.4 0.4  Bilirubin, Direct 0.0 - 0.3 mg/dL - - -    Lab Results  Component Value Date/Time   TSH 7.708 (H) 07/20/2019 08:05 PM   TSH 4.43 06/26/2010 01:55 PM   TSH 1.05 02/15/2009 10:23 AM   FREET4 0.68 07/20/2019 08:30 PM    CBC Latest Ref Rng & Units 02/22/2021 01/23/2021 12/22/2020  WBC 4.0 - 10.5 K/uL 7.2 6.5 5.5  Hemoglobin 12.0 - 15.0 g/dL 10.6(L) 11.5(L) 10.9(L)  Hematocrit 36.0 - 46.0 % 33.2(L) 35.9(L) 34.4(L)  Platelets 150 - 400 K/uL 268 228 205    Lab Results  Component Value Date/Time   VD25OH 16.46 (L) 09/06/2015 02:16 PM   VD25OH 4.8 (L) 05/20/2014 03:33 PM   VD25OH 15 (L) 12/12/2011 06:45 PM    Clinical ASCVD: Yes  The 10-year ASCVD risk score (Arnett DK, et al., 2019) is: 24.1%   Values used to calculate the score:     Age: 43 years      Sex: Female     Is Non-Hispanic African American: No     Diabetic: No     Tobacco smoker: No     Systolic Blood Pressure: 800 mmHg     Is BP treated: Yes     HDL Cholesterol: 53.1 mg/dL     Total Cholesterol: 135 mg/dL    Social History   Tobacco Use  Smoking Status Former   Packs/day: 1.00   Years: 20.00   Pack years: 20.00   Types: Cigarettes   Quit date: 03/19/1986   Years since quitting: 34.9  Smokeless Tobacco Never   BP Readings from Last 3 Encounters:  02/27/21 132/65  02/22/21 (!) 154/65  01/23/21 129/65   Pulse Readings from Last 3 Encounters:  02/27/21 71  02/22/21 80  01/23/21 85   Wt Readings from Last 3 Encounters:  01/23/21 90 lb 12.8 oz (41.2 kg)  01/16/21 91 lb 12.8 oz (41.6 kg)  12/20/20 91 lb 4 oz (41.4 kg)    Assessment: Review of patient past medical history, allergies, medications, health status, including review of consultants reports, laboratory and other test data, was performed as part of comprehensive evaluation and provision of chronic care management services.   SDOH:  (Social Determinants of Health) assessments and interventions performed:      CCM Care Plan  Allergies  Allergen Reactions   Dabigatran Etexilate Mesylate Other (See Comments)    INTERNAL BLEEDING- Pradaxa   Augmentin [Amoxicillin-Pot Clavulanate] Diarrhea and Nausea Only    Did it involve swelling of the face/tongue/throat, SOB, or low BP? No Did it involve sudden or severe rash/hives, skin peeling, or any reaction on the inside of your mouth or nose? No Did you need to seek medical attention at a hospital or doctor's office? No When did it last happen? 02/2019 If all above answers are "NO", may proceed with cephalosporin use.    Talwin [Pentazocine] Other (See Comments)    Hallucinations     Medications Reviewed Today     Reviewed by Cherre Robins, RPH-CPP (Pharmacist) on 03/03/21 at [phone]  Med List Status: <None>   Medication Order Taking? Sig Documenting  Provider Last Dose Status Informant  acetaminophen (TYLENOL) 650 MG CR tablet [id number] Yes Take 650 mg by mouth every 8 (eight) hours as needed for pain. [provider] Taking Active Spouse/Significant Other  albuterol (VENTOLIN HFA) 108 (90 Base) MCG/ACT inhaler [id number] Yes Inhale 2 puffs into the lungs every 6 (six) hours as needed for wheezing or shortness of breath. [provider] Taking Active Spouse/Significant Other           Med Note Antony Contras, Whisper Kurka B   Mon Jun 20, 2020 11:19 AM) No longer getting from PAP - now available generically  amLODipine (NORVASC) 2.5 MG tablet [id number] Yes TAKE 1 TABLET BY MOUTH AT BEDTIME Minus Breeding, MD Taking Active   apixaban (ELIQUIS) 5 MG TABS tablet [id number] Yes Take 1 tablet (5 mg total) by mouth 2 (two) times  daily. Ann Held, DO Taking Active            Med Note Antony Contras, [place] Sep 27, 2020 10:56 AM) Approved to received thru [facility] PAP from 09/08/2020 thru 03/18/2021  atorvastatin (LIPITOR) 20 MG tablet [id number] Yes TAKE 1 TABLET BY MOUTH ONCE DAILY AT  [address] R, DO Taking Active   furosemide (LASIX) 40 MG tablet [id number] Yes TAKE 1 TABLET BY MOUTH ONCE DAILY IN THE MORNING Ann Held, DO Taking Active   gabapentin (NEURONTIN) 600 MG tablet [id number] Yes TAKE 1 TABLET BY MOUTH ONCE DAILY IN THE MORNING, AND AT NOON, AND IN THE EVENING, AND AT BEDTIME Roma Schanz R, DO Taking Active     Discontinued 06/05/11 1433 (Error) nitrofurantoin, macrocrystal-monohydrate, (MACROBID) 100 MG capsule [id number] Yes Take 1 capsule (100 mg total) by mouth daily. Carlyle Basques, MD Taking Active   nitroGLYCERIN (NITROSTAT) 0.4 MG SL tablet [id number] Yes Place 1 tablet (0.4 mg total) under the tongue every 5 (five) minutes as needed. CHEST PAIN Minus Breeding, MD Taking Active            Med Note (DAVIS, SOPHIA A   Tue Dec 20, 2020 11:21 AM) On hand  oxyCODONE-acetaminophen  (PERCOCET) 10-325 MG tablet [id number] Yes Take 1 tablet by mouth every 4 (four) hours as needed. PAIN Ann Held, DO Taking Active   OXYGEN [id number] Yes Inhale 3 L/min into the lungs continuous.  [provider] Taking Active Spouse/Significant Other  pantoprazole (PROTONIX) 40 MG tablet [id number] Yes Take 1 tablet (40 mg total) by mouth 2 (two) times daily. Gatha Mayer, MD Taking Active   promethazine (PHENERGAN) 25 MG tablet [id number] No Take 1 tablet (25 mg total) by mouth every 6 (six) hours as needed for nausea or vomiting. Gatha Mayer, MD Unknown Active   tiZANidine (ZANAFLEX) 4 MG tablet [id number] Yes Take 1 tablet by mouth three times daily as needed for muscle spasm Carollee Herter, Alferd Apa, DO Taking Active   triamcinolone ointment (KENALOG) 0.1 % [id number] Yes Apply 1 application topically daily as needed (to affected areas- for psoriasis).  [provider] Taking Active Spouse/Significant Other  umeclidinium bromide (INCRUSE ELLIPTA) 62.5 MCG/INH AEPB [id number] Yes Inhale 1 puff into the lungs daily. Ann Held, DO Taking Active            Med Note Antony Contras, [place] Nickson B   Tue Jul 26, 2020 11:11 AM) Approved to received [employer] patient assistance 07/13/2020 to 03/18/2021  zolpidem (AMBIEN) 5 MG tablet [id number] Yes TAKE 1 TABLET BY MOUTH EVERY DAY AT BEDTIME AS NEEDED Ann Held, DO Taking Active             Patient Active Problem List   Diagnosis Date Noted   Hematoma 08/07/2019   Other neutropenia ([place])    Post-procedural fever 07/20/2019   Acute febrile illness 07/16/2019   Pulmonary MAI (mycobacterium avium-intracellulare) infection ([place]) 06/24/2019   History of total right hip replacement 05/27/2019   NICM (nonischemic cardiomyopathy) ([place]) 05/19/2019   Educated about COVID-19 virus infection 09/07/2018   Erythropoietin deficiency anemia 06/19/2018   Swelling of calf 02/04/2018   Bad odor of urine 12/09/2017   Iron  deficiency anemia due to chronic blood loss 08/14/2017   Iron malabsorption 08/14/2017   Hyperlipidemia LDL goal <100 07/08/2017   Elevated ALT measurement    Hypotension 06/20/2017   HLD (hyperlipidemia) 06/20/2017  CAD (coronary artery disease) 06/20/2017   Protein-calorie malnutrition, severe ([place]) 06/20/2017   Generalized weakness    Weakness 05/30/2017   Focal neurological deficit 05/30/2017   Chronic diastolic CHF (congestive heart failure) ([place]) 05/30/2017   TIA (transient ischemic attack) 05/30/2017   AKI (acute kidney injury) ([place]) 05/30/2017   Abnormal LFTs 05/30/2017   Bronchiectasis ([place]) 04/22/2017   Right otitis media 07/17/2016   Cerumen impaction 07/17/2016   Osteoporosis 03/16/2016   Headache 11/20/2014   Skin infection 05/27/2014   Insomnia 05/27/2014   Abdominal wall pain 07/09/2013   Other dysphagia 02/02/2013   Platelets decreased ([place]) 01/29/2012   Vitamin D deficiency 12/13/2011   Hypokalemia 12/13/2011   Hypocalcemia 12/12/2011   Adhesions of intestine, very dense, with frozen abdomen 12/11/2011   SBO (small bowel obstruction), recurrent 12/11/2011   Protein-calorie malnutrition, moderate (H[facility]) 12/11/2011   Oxygen dependent    Hx of gastric ulcer    UTI (urinary tract infection) 10/15/2011   Allergic conjunctivitis 10/15/2011   Degenerative arthritis of hip 08/03/2011   B12 deficiency 07/02/2011   Bilateral leg weakness 01/16/2011   Anemia, chronic disease 09/14/2010   Recurrent pneumonia 08/11/2010   CRT- [employer] 06/26/2010   DYSURIA 05/19/2010   Chronic hypoxemic respiratory failure ([place]) 02/21/2010   DEHYDRATION 01/06/2010   GASTROENTERITIS 01/06/2010   Congestive dilated cardiomyopathy ([place]) 11/17/2009   LBBB 81/82/9937   SYSTOLIC HEART FAILURE, CHRONIC 11/17/2009   GERD (gastroesophageal reflux disease) 02/17/2009   NONSPECIFIC ABN FINDNG RAD&OTH EXAM BILARY TRCT 09/03/2008   COPD with emphysema ([place]) 06/28/2008   DIVERTICULOSIS,  COLON 06/03/2008   GASTRITIS 06/02/2008   Atrial fibrillation/flutter 12/08/2007   GASTROPARESIS 08/07/2007   NAUSEA, CHRONIC 08/07/2007   Abdominal pain 08/07/2007   SYNCOPE 02/17/2007   RESTLESS LEG SYNDROME 08/01/2006   Chronic pain syndrome 08/01/2006   OSTEOARTHRITIS 07/31/2006   Degenerative disc disease, lumbar 07/31/2006   Fibromyalgia 07/31/2006   CHRONIC FATIGUE SYNDROME 07/31/2006   URINARY INCONTINENCE 07/31/2006   ANEMIA-IRON DEFICIENCY 05/01/2006   Essential hypertension 05/01/2006    Immunization History  Administered Date(s) Administered   DTaP 11/03/1996, 01/04/1997, 05/03/1997, 10/19/1997, 08/05/2001   Fluad Quad(high Dose 65+) 11/06/2018, 12/31/2019, 01/16/2021   HPV Quadrivalent 09/05/2009, 09/13/2010, 09/26/2011   Hepatitis A, Ped/Adol-2 Dose 09/21/2004, 03/29/2005   Hepatitis B, ped/adol 04/30/1996, 11/03/1996, 10/19/1997   HiB (PRP-T) 11/03/1996, 01/04/1997, 05/03/1997, 10/19/1997   IPV 11/03/1996, 01/04/1997, 05/03/1997, 08/05/2001   Influenza Split 01/16/2011, 12/12/2011   Influenza Whole 01/15/2007, 12/08/2007, 12/28/2008, 12/17/2009   Influenza, High Dose Seasonal PF 11/28/2015, 12/07/2016, 11/15/2017   Influenza, Seasonal, Injecte, Preservative Fre 12/09/2012   Influenza,Quad,Nasal, Live 03/17/2007, 03/06/2009, 01/08/2010, 01/30/2011, 02/09/2012, 01/18/2013   Influenza,inj,Quad PF,6+ Mos 03/29/2005, 04/30/2005, 03/01/2006, 01/28/2008, 12/01/2013, 01/16/2014, 12/22/2014, 12/28/2014   MMR 05/03/1997, 08/05/2001   Meningococcal B, OMV 12/22/2014   Pneumococcal Conjugate-13 06/10/2014, 11/15/2014   Pneumococcal Polysaccharide-23 12/17/2009   Tdap 09/17/2007, 05/15/2014, 12/09/2018   Varicella 11/16/1997, 05/18/2008   Zoster, Live 12/31/2013    Conditions to be addressed/monitored: CHF, HTN, HLD, COPD, Pulmonary Disease and GERD, insomnia; chronic pain; CAD  Care Plan : General Pharmacy (Adult)  Updates made by Cherre Robins, RPH-CPP since  03/03/2021 12:00 AM     Problem: Afib, COPD, HF, HTN, HLD, CAD, Chronic Pain Syndrome, GERD, Insomnia   Priority: High  Onset Date: 05/10/2020     Long-Range Goal: Provide education, support and care coordination for medication therapy and chronic conditions   Start Date: 05/10/2020  Expected End Date: 09/23/2020  Recent Progress: On track  Priority: High  Note:   Current Barriers:  Unable to independently afford treatment regimen Chronic Disease Management support, education, and care coordination needs related to Afib, COPD, HF, HTN, HLD, CAD, Chronic Pain Syndrome, GERD, Insomnia Not taking statin with history of CAD and TIA (restarted atorvastatin 11/23/2020)  Pharmacist Clinical Goal(s):  Over the next 90 days, patient will verbalize ability to afford treatment regimen achieve adherence to monitoring guidelines and medication adherence to achieve therapeutic efficacy contact provider office for questions/concerns as evidenced notation of same in electronic health record through collaboration with PharmD and provider.   Interventions: 1:1 collaboration with Carollee Herter, Alferd Apa, DO regarding development and update of comprehensive plan of care as evidenced by provider attestation and co-signature Inter-disciplinary care team collaboration (see longitudinal plan of care) Comprehensive medication review performed; medication list updated in electronic medical record  Hypertension (BP goal <140/90) Controlled Current treatment: Amlodipine 2.53m daily in the evening Medications previously tried: none noted  Current home readings: checks infrequently. Reports home blood pressure on pats month has been 130-140 / 70's Pharmacist interventions:  Educated on BP goals and benefits of medications for prevention of heart attack, stroke and kidney damage; Reminded about the importance of home blood pressure monitoring; Recommended to continue current medication.    Hyperlipidemia / CAD /  history of TIA:  LDL at goal since restarted statin in Spetember 2022; LDL goal <70  Prescribed treatment:  Atorvastatin 268mdaily  Interventions:  Discussed benefits of statin therapy in prevention of stroke and heart attack [facility] (Goal: prevent stroke and major bleeding) Controlled  Current treatment: Anticoagulation: Eliquis 33m66mwice daily  Medications previously tried: amiodarone, carvedilol Patient received Eliquis from patient assistance program through 03/18/2021.  Intervention:  Maintain Eliquis therapy Patient will continue to receive Eliquis from PAP through 03/18/2021. Discussed that we will reapply for Eliquis if needed in 2023 (program requirements do not allow reapplication until reaches Medicare coverage gap and meets 3% out of pocket spend for medications in calendar year.   COPD with past MAI infection (Goal: control symptoms and prevent exacerbations) Controlled Completed antibiotics for MAI infection in April 2022.  Current treatment  Incruse ellipta 62.33mg52mh once daily (per Sood) Proventil (per SoodBancroftdications previously tried: none noted  Gold Grade: Gold 2 (FEV1 50-79%) Current COPD Classification:  B (high sx, <2 exacerbations/yr) Pulmonary function testing: 10/10/2011-pulmonary function test-FVC 1.47 (54% predicted), postbronchodilator ratio 64, postbronchodilator FEV1 1.15 (59% predicted), positive bronchodilator response, DLCO 5.3 (35% predicted) Exacerbations requiring treatment in last 6 months: none Patient reports consistent use of maintenance inhaler Frequency of rescue inhaler use: rare - only about 1 or 2 times per month now Pharmacist Interventions:  Recommended continue current inhalers for COPD PAP application for Incruse has been returned ([place] [place] and advise patient to complete before 03/18/2021) however due to patient having Medicare coverage she will need to spend $600 out  of pocket for medications in 2023 before we can officially apply for patient assistance program through [employer] [place] her Incruse.  Patient is taking nitrofurantoin daily for UTI prevention. Consider risk versus benefits in patient with COPD given that nitrofurantoin is associated with pulmonary injury.   Pain (Goal: Minimize symptoms) Controlled Current treatment  Gabapentin 600mg733mimes daily Oxycodone / acetaminophen 10-3233mg 80mmes daily Tizanidine 4mg TI1monly takes BID) Acetaminophen 3233mg - 59m 2 tabs PRN - uses every other day Medications previously tried: none noted Pharmacist interventions:  Recommended to  continue current medication Requested refills for oxycodone / acetaminophen and gabapentin from PCP at patient request - due around 03/07/2021  Osteoporosis  Goal: reduce risk of fracture due to osteoporosis  Current regimen:  None Interventions: Consider recheck DEXA - order was been place September 2022 by Dr Carollee Herter.   Health Maintenance  Interventions: Discussed consideration of completing Shingrix vaccine series (would wait until completion of MAI abx treatment) Discussed consideration of completing COVID Vaccine Series Patient self care activities - Over the next year, patient will: Consider completion of Shingrix vaccine series in 2023 when anticipate coverage thru Medicare will improve Consider completion of COVID Vaccine series - patient declines   Medication management Current pharmacy: [facility] Interventions Comprehensive medication review performed. Reviewed refill history and assessed adherence Continue current medication management strategy  Patient Goals/Self-Care Activities Over the next 90 days, patient will:  take medications as prescribed check blood pressure periodically, document, and provide at future appointments collaborate with provider on medication access solutions  Follow Up Plan: The care management team will reach out to the  patient again over the next 90 days.        Medication Assistance: Approved for assistance for Incruse thru [place] assistance program- received first delivery 07/2020;   Approved for assistance for Eliquis thru [employer] assistance program - received first delivery 09/16/2020. Started application for [place] / Incruse for 2023 will need to spend $600 out of pocket in 2023 to qualify for [place] assistance. Also will monitor for 3% out of pocket spend needed in 2023 to reapply for Eliquis patient assistance program as well.  Patient's preferred pharmacy is:  [facility], [address] [place] [address] Phone: [phone] Fax: [phone]  Follow Up:  Patient agrees to Care Plan and Follow-up.  Plan: Telephone follow up appointment with care management team member scheduled for:  2 to 3 months  Cherre Robins, PharmD Clinical Pharmacist [place] [phone]

## 2021-03-03 NOTE — Telephone Encounter (Signed)
During Chronic Care Management visit today patient requested refills for the medications below:  Oxycodone / acetaminophen 10/325mg  / SIG: 1 tablet q4h Last filled #120 = 20 day supply 02/06/2021  Gabapentin 600mg  / SIG: 1 capsule each morning, noon, evening and bedtime Last filled #120 = 30 days supply 02/07/2021   Pharmacy: [place], [place]

## 2021-03-03 NOTE — Patient Instructions (Addendum)
[name] It was a pleasure speaking with you today.  I have attached a summary of our visit today and information about your health goals.   Our next appointment is by telephone on May 16, 2021 at 10:30am  Please call the care guide team at [phone] if you need to cancel or reschedule your appointment.   If you have any questions or concerns, please feel free to contact me either at the phone number below or with a MyChart message.   Keep up the good work!  Cherre Robins, PharmD Clinical Pharmacist [facility] [phone] (direct line)  [phone] (main office number)   The patient verbalized understanding of instructions, educational materials, and care plan provided today and declined offer to receive copy of patient instructions, educational materials, and care plan.    CARE PLAN ENTRY (see longitudinal plan of care for additional care plan information)  Current Barriers:  Chronic Disease Management support, education, and care coordination needs related to Afib, COPD, HF, Hypertension, elevated cholesterol, Heart disease, Chronic Pain Syndrome, GERD, Insomnia   Hypertension BP Readings from Last 3 Encounters:  02/27/21 132/65  02/22/21 (!) 154/65  01/23/21 129/65   Pharmacist Clinical Goal(s): Over the next 90 days, patient will work with PharmD and providers to achieve BP goal <140/90 Current regimen:  Amlodipine 2.5mg  daily each evening Intervention:  Discussed blood pressure goals Patient self care activities - Over the next 90 days, patient will: Maintain hypertension medication regimen.  Check blood pressure at home 2 to 3 times per week, record and bring to office appointments  Atrial Fibrillation:  Pharmacist Clinical Goal(s): Over the next 90 days, patient will work with PharmD and providers to improve access to Eliquis Current regimen:  Eliquis 5mg  twice a day Intervention:  Reminded to reorder Eliquis form  patient assistance program  Patient self care activities - Over the next 90 days, patient will: Maintain Eliquis therapy Will continue to monitor in 2023 for patient assistance program for Eliquis (must reach Medicare coverage gap and spend 3% of income on medications.   Hyperlipidemia:   Lab Results  Component Value Date/Time   [facility] 01/16/2021 02:22 PM   Pharmacist Clinical Goal(s): Over the next 90 days, patient will work with PharmD and providers to maintain LDL goal < 70 Current regimen:  Atorvastatin 20mg  daily Patient self care activities - Over the next 90 days, patient will: Continue atorvastatin 20mg  daily Coordinated refill for atorvastatin  COPD Pharmacist Clinical Goal(s) Over the next 90 days, patient will work with PharmD and providers to reduce symptoms associated with COPD and reduce barrier to receiving medication Current regimen:  Incruse ellipta 62.5mg /inh once daily  Proventil as needed Patient self care activities - Over the next 90 days, patient will: Continue current therapy Started application for 2355 for Incruse patient assistance program. Will have to spend $600 out of pocket before she can apply for patient assistance program.  Osteoporosis  Pharmacist Clinical Goal(s) Over the next 180 days, patient will work with PharmD and providers to reduce risk of fracture due to osteoporosis  Current regimen:  None Patient self care activities - Over the next 180 days, patient will: Consider getting DEXA - Dr Carollee Herter has already ordered.  Health Maintenance  Pharmacist Clinical Goal(s) Over the next year, patient will work with PharmD and providers to complete health maintenance screenings/vaccinations Interventions: Discussed consideration of completing Shingrix vaccine series (would wait until completion of MAI abx treatment) Discussed consideration of completing  COVID Vaccine Series Patient self care activities - Over the next year, patient  will: Consider completion of Shingrix vaccine series - 2023 Consider completion of COVID Vaccine series  Medication management Pharmacist Clinical Goal(s): Over the next 90 days, patient will work with PharmD and providers to maintain optimal medication adherence Current pharmacy: [facility]  Interventions Comprehensive medication review performed. Approved for patient assistance program for Incruse and Eliquis thru 2022 Patient self care activities - Over the next 90 days, patient will: Focus on medication adherence by filling and taking medications appropriately  Take medications as prescribed Report any questions or concerns to PharmD and/or provider(s) Requested refills for gabapentin and Oxycodone / acetaminophen from Dr Carollee Herter (due around 02/05/2021)

## 2021-03-06 ENCOUNTER — Other Ambulatory Visit: Payer: Self-pay

## 2021-03-06 ENCOUNTER — Inpatient Hospital Stay: Payer: HMO

## 2021-03-06 VITALS — BP 168/67 | HR 75 | Temp 97.6°F | Resp 17

## 2021-03-06 DIAGNOSIS — N183 Chronic kidney disease, stage 3 unspecified: Secondary | ICD-10-CM | POA: Diagnosis not present

## 2021-03-06 DIAGNOSIS — R52 Pain, unspecified: Secondary | ICD-10-CM

## 2021-03-06 DIAGNOSIS — D500 Iron deficiency anemia secondary to blood loss (chronic): Secondary | ICD-10-CM

## 2021-03-06 MED ORDER — SODIUM CHLORIDE 0.9% FLUSH
10.0000 mL | INTRAVENOUS | Status: DC | PRN
  Administered 2021-03-06: 12:00:00 10 mL

## 2021-03-06 MED ORDER — HEPARIN SOD (PORK) LOCK FLUSH 100 UNIT/ML IV SOLN
500.0000 [IU] | Freq: Once | INTRAVENOUS | Status: AC | PRN
  Administered 2021-03-06: 12:00:00 500 [IU]

## 2021-03-06 MED ORDER — OXYCODONE-ACETAMINOPHEN 10-325 MG PO TABS: 1.0000 | ORAL_TABLET | ORAL | 0 refills | Status: DC | PRN

## 2021-03-06 MED ORDER — SODIUM CHLORIDE 0.9 % IV SOLN
Freq: Once | INTRAVENOUS | Status: AC
Start: 2021-03-06 — End: 2021-03-06

## 2021-03-06 MED ORDER — SODIUM CHLORIDE 0.9 % IV SOLN
200.0000 mg | Freq: Once | INTRAVENOUS | Status: AC
  Administered 2021-03-06: 12:00:00 200 mg via INTRAVENOUS
  Filled 2021-03-06: qty 200

## 2021-03-06 NOTE — Patient Instructions (Signed)

## 2021-03-06 NOTE — Telephone Encounter (Signed)
Pt calling in for her monthly refill of oxycodone.  Pharmacy is [facility] on [address] of [address]

## 2021-03-06 NOTE — Telephone Encounter (Signed)
Requesting: Percocet Contract: 04/25/2020 UDS: 04/25/2020 Last OV: 01/16/2021 Next OV: 04/18/2020 Last Refill: 03/03/2021, #[id number] RF Database:   Please advise

## 2021-03-06 NOTE — Progress Notes (Signed)
Pt declined to stay for post infusion observation period. Pt stated she has tolerated medication multiple times prior without difficulty. Pt aware to call clinic with any questions or concerns. Pt verbalized understanding and had no further questions.  ? ?

## 2021-03-17 ENCOUNTER — Ambulatory Visit (INDEPENDENT_AMBULATORY_CARE_PROVIDER_SITE_OTHER): Payer: HMO

## 2021-03-17 DIAGNOSIS — I428 Other cardiomyopathies: Secondary | ICD-10-CM

## 2021-03-17 LAB — CUP PACEART REMOTE DEVICE CHECK
Battery Remaining Longevity: 98 mo
Battery Voltage: 3 V
Brady Statistic AP VP Percent: 2.93 %
Brady Statistic AP VS Percent: 0.04 %
Brady Statistic AS VP Percent: 96.9 %
Brady Statistic AS VS Percent: 0.14 %
Brady Statistic RA Percent Paced: 2.98 %
Brady Statistic RV Percent Paced: 99.83 %
Date Time Interrogation Session: 20221230013220
Implantable Lead Implant Date: 20111005
Implantable Lead Implant Date: 20111005
Implantable Lead Implant Date: 20111005
Implantable Lead Location: 753858
Implantable Lead Location: 753859
Implantable Lead Location: 753860
Implantable Lead Model: 4196
Implantable Lead Model: 5076
Implantable Lead Model: 5076
Implantable Pulse Generator Implant Date: 20210402
Lead Channel Impedance Value: 266 Ohm
Lead Channel Impedance Value: 323 Ohm
Lead Channel Impedance Value: 342 Ohm
Lead Channel Impedance Value: 361 Ohm
Lead Channel Impedance Value: 380 Ohm
Lead Channel Impedance Value: 418 Ohm
Lead Channel Impedance Value: 418 Ohm
Lead Channel Impedance Value: 475 Ohm
Lead Channel Impedance Value: 627 Ohm
Lead Channel Pacing Threshold Amplitude: 0.375 V
Lead Channel Pacing Threshold Amplitude: 0.625 V
Lead Channel Pacing Threshold Pulse Width: 0.4 ms
Lead Channel Pacing Threshold Pulse Width: 0.4 ms
Lead Channel Sensing Intrinsic Amplitude: 1.125 mV
Lead Channel Sensing Intrinsic Amplitude: 1.125 mV
Lead Channel Sensing Intrinsic Amplitude: 19.25 mV
Lead Channel Sensing Intrinsic Amplitude: 19.25 mV
Lead Channel Setting Pacing Amplitude: 1.25 V
Lead Channel Setting Pacing Amplitude: 2 V
Lead Channel Setting Pacing Amplitude: 2.5 V
Lead Channel Setting Pacing Pulse Width: 0.4 ms
Lead Channel Setting Pacing Pulse Width: 0.8 ms
Lead Channel Setting Sensing Sensitivity: 0.9 mV

## 2021-03-18 DIAGNOSIS — I10 Essential (primary) hypertension: Secondary | ICD-10-CM | POA: Diagnosis not present

## 2021-03-18 DIAGNOSIS — M810 Age-related osteoporosis without current pathological fracture: Secondary | ICD-10-CM

## 2021-03-18 DIAGNOSIS — J439 Emphysema, unspecified: Secondary | ICD-10-CM | POA: Diagnosis not present

## 2021-03-24 ENCOUNTER — Other Ambulatory Visit: Payer: Self-pay

## 2021-03-24 ENCOUNTER — Inpatient Hospital Stay: Payer: HMO

## 2021-03-24 ENCOUNTER — Inpatient Hospital Stay: Payer: HMO | Attending: Family

## 2021-03-24 VITALS — BP 162/59 | HR 80 | Temp 98.0°F | Resp 17

## 2021-03-24 DIAGNOSIS — D500 Iron deficiency anemia secondary to blood loss (chronic): Secondary | ICD-10-CM

## 2021-03-24 DIAGNOSIS — N183 Chronic kidney disease, stage 3 unspecified: Secondary | ICD-10-CM | POA: Diagnosis not present

## 2021-03-24 DIAGNOSIS — D631 Anemia in chronic kidney disease: Secondary | ICD-10-CM | POA: Diagnosis not present

## 2021-03-24 LAB — RETICULOCYTES
Immature Retic Fract: 5.9 % (ref 2.3–15.9)
RBC.: 3.61 MIL/uL — ABNORMAL LOW (ref 3.87–5.11)
Retic Count, Absolute: 26 10*3/uL (ref 19.0–186.0)
Retic Ct Pct: 0.7 % (ref 0.4–3.1)

## 2021-03-24 LAB — CBC WITH DIFFERENTIAL (CANCER CENTER ONLY)
Abs Immature Granulocytes: 0.03 10*3/uL (ref 0.00–0.07)
Basophils Absolute: 0.1 10*3/uL (ref 0.0–0.1)
Basophils Relative: 1 %
Eosinophils Absolute: 0.1 10*3/uL (ref 0.0–0.5)
Eosinophils Relative: 2 %
HCT: 32.6 % — ABNORMAL LOW (ref 36.0–46.0)
Hemoglobin: 10.4 g/dL — ABNORMAL LOW (ref 12.0–15.0)
Immature Granulocytes: 0 %
Lymphocytes Relative: 6 %
Lymphs Abs: 0.4 10*3/uL — ABNORMAL LOW (ref 0.7–4.0)
MCH: 28.9 pg (ref 26.0–34.0)
MCHC: 31.9 g/dL (ref 30.0–36.0)
MCV: 90.6 fL (ref 80.0–100.0)
Monocytes Absolute: 0.7 10*3/uL (ref 0.1–1.0)
Monocytes Relative: 11 %
Neutro Abs: 5.3 10*3/uL (ref 1.7–7.7)
Neutrophils Relative %: 80 %
Platelet Count: 253 10*3/uL (ref 150–400)
RBC: 3.6 MIL/uL — ABNORMAL LOW (ref 3.87–5.11)
RDW: 14.9 % (ref 11.5–15.5)
WBC Count: 6.7 10*3/uL (ref 4.0–10.5)
nRBC: 0 % (ref 0.0–0.2)

## 2021-03-24 LAB — CMP (CANCER CENTER ONLY)
ALT: 10 U/L (ref 0–44)
AST: 22 U/L (ref 15–41)
Albumin: 4 g/dL (ref 3.5–5.0)
Alkaline Phosphatase: 126 U/L (ref 38–126)
Anion gap: 7 (ref 5–15)
BUN: 17 mg/dL (ref 8–23)
CO2: 32 mmol/L (ref 22–32)
Calcium: 9 mg/dL (ref 8.9–10.3)
Chloride: 99 mmol/L (ref 98–111)
Creatinine: 0.87 mg/dL (ref 0.44–1.00)
GFR, Estimated: >60 mL/min (ref >60)
Glucose, Bld: 100 mg/dL — ABNORMAL HIGH (ref 70–99)
Potassium: 4 mmol/L (ref 3.5–5.1)
Sodium: 138 mmol/L (ref 135–145)
Total Bilirubin: 0.5 mg/dL (ref 0.3–1.2)
Total Protein: 6.8 g/dL (ref 6.5–8.1)

## 2021-03-24 MED ORDER — HEPARIN SOD (PORK) LOCK FLUSH 100 UNIT/ML IV SOLN
500.0000 [IU] | Freq: Once | INTRAVENOUS | Status: AC | PRN
  Administered 2021-03-24: 500 [IU]

## 2021-03-24 MED ORDER — EPOETIN ALFA-EPBX 40000 UNIT/ML IJ SOLN
40000.0000 [IU] | Freq: Once | INTRAMUSCULAR | Status: AC
  Administered 2021-03-24: 40000 [IU] via SUBCUTANEOUS
  Filled 2021-03-24: qty 1

## 2021-03-24 MED ORDER — SODIUM CHLORIDE 0.9% FLUSH
10.0000 mL | INTRAVENOUS | Status: DC | PRN
  Administered 2021-03-24: 10 mL

## 2021-03-24 NOTE — Patient Instructions (Signed)
Epoetin Alfa injection °What is this medication? °EPOETIN ALFA (e POE e tin AL fa) helps your body make more red blood cells. This medicine is used to treat anemia caused by chronic kidney disease, cancer chemotherapy, or HIV-therapy. It may also be used before surgery if you have anemia. °This medicine may be used for other purposes; ask your health care provider or pharmacist if you have questions. °COMMON BRAND NAME(S): Epogen, Procrit, Retacrit °What should I tell my care team before I take this medication? °They need to know if you have any of these conditions: °cancer °heart disease °high blood pressure °history of blood clots °history of stroke °low levels of folate, iron, or vitamin B12 in the blood °seizures °an unusual or allergic reaction to erythropoietin, albumin, benzyl alcohol, hamster proteins, other medicines, foods, dyes, or preservatives °pregnant or trying to get pregnant °breast-feeding °How should I use this medication? °This medicine is for injection into a vein or under the skin. It is usually given by a health care professional in a hospital or clinic setting. °If you get this medicine at home, you will be taught how to prepare and give this medicine. Use exactly as directed. Take your medicine at regular intervals. Do not take your medicine more often than directed. °It is important that you put your used needles and syringes in a special sharps container. Do not put them in a trash can. If you do not have a sharps container, call your pharmacist or healthcare provider to get one. °A special MedGuide will be given to you by the pharmacist with each prescription and refill. Be sure to read this information carefully each time. °Talk to your pediatrician regarding the use of this medicine in children. While this drug may be prescribed for selected conditions, precautions do apply. °Overdosage: If you think you have taken too much of this medicine contact a poison control center or emergency  room at once. °NOTE: This medicine is only for you. Do not share this medicine with others. °What if I miss a dose? °If you miss a dose, take it as soon as you can. If it is almost time for your next dose, take only that dose. Do not take double or extra doses. °What may interact with this medication? °Interactions have not been studied. °This list may not describe all possible interactions. Give your health care provider a list of all the medicines, herbs, non-prescription drugs, or dietary supplements you use. Also tell them if you smoke, drink alcohol, or use illegal drugs. Some items may interact with your medicine. °What should I watch for while using this medication? °Your condition will be monitored carefully while you are receiving this medicine. °You may need blood work done while you are taking this medicine. °This medicine may cause a decrease in vitamin B6. You should make sure that you get enough vitamin B6 while you are taking this medicine. Discuss the foods you eat and the vitamins you take with your health care professional. °What side effects may I notice from receiving this medication? °Side effects that you should report to your doctor or health care professional as soon as possible: °allergic reactions like skin rash, itching or hives, swelling of the face, lips, or tongue °seizures °signs and symptoms of a blood clot such as breathing problems; changes in vision; chest pain; severe, sudden headache; pain, swelling, warmth in the leg; trouble speaking; sudden numbness or weakness of the face, arm or leg °signs and symptoms of a stroke like   changes in vision; confusion; trouble speaking or understanding; severe headaches; sudden numbness or weakness of the face, arm or leg; trouble walking; dizziness; loss of balance or coordination °Side effects that usually do not require medical attention (report to your doctor or health care professional if they continue or are  bothersome): °chills °cough °dizziness °fever °headaches °joint pain °muscle cramps °muscle pain °nausea, vomiting °pain, redness, or irritation at site where injected °This list may not describe all possible side effects. Call your doctor for medical advice about side effects. You may report side effects to FDA at 1-800-FDA-1088. °Where should I keep my medication? °Keep out of the reach of children. °Store in a refrigerator between 2 and 8 degrees C (36 and 46 degrees F). Do not freeze or shake. Throw away any unused portion if using a single-dose vial. Multi-dose vials can be kept in the refrigerator for up to 21 days after the initial dose. Throw away unused medicine. °NOTE: This sheet is a summary. It may not cover all possible information. If you have questions about this medicine, talk to your doctor, pharmacist, or health care provider. °© 2022 Elsevier/Gold Standard (2016-11-06 00:00:00) ° °

## 2021-03-27 LAB — IRON AND IRON BINDING CAPACITY (CC-WL,HP ONLY)
Iron: 34 ug/dL (ref 28–170)
Saturation Ratios: 15 % (ref 10.4–31.8)
TIBC: 223 ug/dL — ABNORMAL LOW (ref 250–450)
UIBC: 189 ug/dL (ref 148–442)

## 2021-03-27 LAB — FERRITIN: Ferritin: 917 ng/mL — ABNORMAL HIGH (ref 11–307)

## 2021-03-29 ENCOUNTER — Other Ambulatory Visit: Payer: Self-pay | Admitting: Cardiology

## 2021-03-29 DIAGNOSIS — I10 Essential (primary) hypertension: Secondary | ICD-10-CM

## 2021-03-29 NOTE — Progress Notes (Signed)
Remote pacemaker transmission.   

## 2021-03-31 ENCOUNTER — Other Ambulatory Visit: Payer: Self-pay | Admitting: Cardiology

## 2021-03-31 DIAGNOSIS — I10 Essential (primary) hypertension: Secondary | ICD-10-CM

## 2021-04-03 ENCOUNTER — Telehealth: Payer: Self-pay | Admitting: Family Medicine

## 2021-04-03 DIAGNOSIS — R52 Pain, unspecified: Secondary | ICD-10-CM

## 2021-04-03 NOTE — Telephone Encounter (Signed)
Medication: gabapentin (NEURONTIN) 600 MG tablet  Has the patient contacted their pharmacy? Yes.   Preferred Pharmacy: [facility], [address]  [place], [place] [address]  Phone:  [phone]  Fax:  [phone]

## 2021-04-04 MED ORDER — GABAPENTIN 600 MG PO TABS: ORAL_TABLET | ORAL | 0 refills | Status: DC

## 2021-04-04 NOTE — Telephone Encounter (Signed)
Refill sent.

## 2021-04-10 ENCOUNTER — Encounter: Payer: Self-pay | Admitting: Primary Care

## 2021-04-10 ENCOUNTER — Ambulatory Visit: Payer: HMO | Admitting: Primary Care

## 2021-04-10 ENCOUNTER — Telehealth: Payer: Self-pay | Admitting: Primary Care

## 2021-04-10 ENCOUNTER — Ambulatory Visit (INDEPENDENT_AMBULATORY_CARE_PROVIDER_SITE_OTHER): Payer: HMO

## 2021-04-10 ENCOUNTER — Other Ambulatory Visit: Payer: Self-pay

## 2021-04-10 VITALS — BP 110/62 | HR 94 | Temp 97.3°F | Ht 60.0 in | Wt 92.2 lb

## 2021-04-10 DIAGNOSIS — J439 Emphysema, unspecified: Secondary | ICD-10-CM | POA: Diagnosis not present

## 2021-04-10 DIAGNOSIS — J9611 Chronic respiratory failure with hypoxia: Secondary | ICD-10-CM

## 2021-04-10 DIAGNOSIS — A310 Pulmonary mycobacterial infection: Secondary | ICD-10-CM | POA: Diagnosis not present

## 2021-04-10 DIAGNOSIS — J189 Pneumonia, unspecified organism: Secondary | ICD-10-CM

## 2021-04-10 DIAGNOSIS — J471 Bronchiectasis with (acute) exacerbation: Secondary | ICD-10-CM | POA: Diagnosis not present

## 2021-04-10 DIAGNOSIS — J479 Bronchiectasis, uncomplicated: Secondary | ICD-10-CM | POA: Diagnosis not present

## 2021-04-10 MED ORDER — LEVOFLOXACIN 500 MG PO TABS: 500.0000 mg | ORAL_TABLET | Freq: Every day | ORAL | 0 refills | Status: DC

## 2021-04-10 NOTE — Assessment & Plan Note (Signed)
-   Stable; Continue 3L supplemental oxygen to maintain O2 > 88-92%

## 2021-04-10 NOTE — Assessment & Plan Note (Addendum)
-   Patient reports new pleuritic pain on right side x 1 week with associated NP cough. She is not consistently using flutter valve or taking mucinex. Recommend checking CXR today, depending on results we will likely send in abx for acute exacerbation. Advised she return to see ID as she is overdue for follow-up.   Recommendations:  - Resume Flutter valve 3 times a day on lowest setting - Take mucinex 600mg  twice daily with glass of water - You are overdue for follow-up with Dr. Baxter Flattery with Infectious disease (due November 2022), please call and set up visit with her  - Depending on CXR results we may send in antibiotic course   Orders: - CXR re: pleuritic pain/hx bronchiectasis (ordered)  Follow-up: - First available with Dr. Halford Chessman in 1-3 months

## 2021-04-10 NOTE — Addendum Note (Signed)
Addended by: Martyn Ehrich on: 04/10/2021 12:46 PM   Modules accepted: Orders

## 2021-04-10 NOTE — Progress Notes (Addendum)
_0  ID: [name], female    DOB: 06/20/1945, 76 y.o.   MRN: [id number]  Chief Complaint  Patient presents with   Follow-up    Pin in lower side on the right side below the shoulder blade and pt states it might be pneumonia. Is currently on 3L     Referring provider: Ann Held, *  HPI: 76 year old female, former smoker. PMH significant for bronchiectasis, COPD with emphysema, recurrent pneumonia, chronic respiratory failure. Patient of Dr. Halford Chessman, last seen in April 2022. Maintained on flutter valve and mucinex prn. Spiriva changed to [employer] requirements.   04/10/2021- Interim hx  Patient present today for acute OV. She was last seen in April 2022, overdue for 2 month follow-up. HRCT in April 2022 showed changes from MAI. Continues to follow with Dr. Baxter Flattery with ID, last seen in July 2022.  She has been on therapt for 6 months, stopped in June d.t anorexia. Continues with observation off antibiotic.   She reports pain on right side under her shoulder blade x 1-2 weeks. She feels like she needs to cough but can't because of the pain. She occasionally get up mucus, unsure color. Her breathing is baseline, she is no more short of breath than usual. She is compliant with Incruse inhaler. She uses her flutter valve once a week. She does not take mucinex regularly. She is wearing 2L supplemental oxygen. Denies f/c/s, chest pain, wheezing, falls or trauma.    Allergies  Allergen Reactions   Dabigatran Etexilate Mesylate Other (See Comments)    INTERNAL BLEEDING- Pradaxa   Augmentin [Amoxicillin-Pot Clavulanate] Diarrhea and Nausea Only    Did it involve swelling of the face/tongue/throat, SOB, or low BP? No Did it involve sudden or severe rash/hives, skin peeling, or any reaction on the inside of your mouth or nose? No Did you need to seek medical attention at a hospital or doctor's office? No When did it last happen? 02/2019 If all above answers are "NO",  may proceed with cephalosporin use.    Talwin [Pentazocine] Other (See Comments)    Hallucinations     Immunization History  Administered Date(s) Administered   DTaP 11/03/1996, 01/04/1997, 05/03/1997, 10/19/1997, 08/05/2001   Fluad Quad(high Dose 65+) 11/06/2018, 12/31/2019, 01/16/2021   HPV Quadrivalent 09/05/2009, 09/13/2010, 09/26/2011   Hepatitis A, Ped/Adol-2 Dose 09/21/2004, 03/29/2005   Hepatitis B, ped/adol 04/30/1996, 11/03/1996, 10/19/1997   HiB (PRP-T) 11/03/1996, 01/04/1997, 05/03/1997, 10/19/1997   IPV 11/03/1996, 01/04/1997, 05/03/1997, 08/05/2001   Influenza Split 01/16/2011, 12/12/2011   Influenza Whole 01/15/2007, 12/08/2007, 12/28/2008, 12/17/2009   Influenza, High Dose Seasonal PF 11/28/2015, 12/07/2016, 11/15/2017   Influenza, Seasonal, Injecte, Preservative Fre 12/09/2012   Influenza,Quad,Nasal, Live 03/17/2007, 03/06/2009, 01/08/2010, 01/30/2011, 02/09/2012, 01/18/2013   Influenza,inj,Quad PF,6+ Mos 03/29/2005, 04/30/2005, 03/01/2006, 01/28/2008, 12/01/2013, 01/16/2014, 12/22/2014, 12/28/2014   MMR 05/03/1997, 08/05/2001   Meningococcal B, OMV 12/22/2014   Pneumococcal Conjugate-13 06/10/2014, 11/15/2014   Pneumococcal Polysaccharide-23 12/17/2009   Tdap 09/17/2007, 05/15/2014, 12/09/2018   Varicella 11/16/1997, 05/18/2008   Zoster, Live 12/31/2013    Past Medical History:  Diagnosis Date   Anemia    Arthritis    Atrial fibrillation ([place])    CAD (coronary artery disease)    Mild disease per cath in 2011   COPD (chronic obstructive pulmonary disease) ([place])    Home O2   Diverticulitis    Elbow fracture, left aug 2012   Erythropoietin deficiency anemia 06/19/2018   Essential hypertension    Fibromyalgia  Gastroparesis    GERD (gastroesophageal reflux disease)    H/O: GI bleed    from Pradaxa   Hepatitis    Hx of in high school    History of pneumonia    Recurrent   Hx MRSA infection    Hx of gastric ulcer    Hx of stress fx 10/2009   Right  hip    Iron malabsorption 08/14/2017   LBBB (left bundle branch block)    Oxygen dependent    2 liters via nasal cannula at all times   Pacemaker    CRT therapy; followed by Dr. 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     albuterol (VENTOLIN HFA) 108 (90 Base) MCG/ACT inhaler Inhale 2 puffs into the lungs every 6 (six) hours as needed for wheezing or shortness of breath.     amLODipine (NORVASC) 2.5 MG tablet TAKE 1 TABLET BY MOUTH AT BEDTIME 90 tablet 0   apixaban (ELIQUIS) 5 MG TABS tablet Take 1 tablet (5 mg total) by mouth 2 (two) times daily. 180 tablet 0   atorvastatin (LIPITOR) 20 MG tablet TAKE 1 TABLET BY MOUTH ONCE DAILY AT  6PM 90 tablet 1   furosemide (LASIX) 40 MG tablet TAKE 1 TABLET BY MOUTH ONCE DAILY IN THE MORNING 90 tablet 0   gabapentin (NEURONTIN) 600 MG tablet TAKE 1 TABLET BY MOUTH ONCE DAILY IN THE MORNING, AND AT NOON, AND IN THE EVENING, AND AT BEDTIME 120 tablet 0   nitrofurantoin, macrocrystal-monohydrate, (MACROBID) 100 MG capsule Take 1 capsule (100 mg total) by mouth daily.     nitroGLYCERIN (NITROSTAT) 0.4 MG SL tablet Place 1 tablet (0.4 mg total) under the tongue  every 5 (five) minutes as needed. CHEST PAIN 25 tablet 6   oxyCODONE-acetaminophen (PERCOCET) 10-325 MG tablet Take 1 tablet by mouth every 4 (four) hours as needed. PAIN 120 tablet 0   OXYGEN Inhale 3 L/min into the lungs continuous.      pantoprazole (PROTONIX) 40 MG tablet Take 1 tablet (40 mg total) by mouth 2 (two) times daily. 180 tablet 3   promethazine (PHENERGAN) 25 MG tablet Take 1 tablet (25 mg total) by mouth every 6 (six) hours as needed for nausea or vomiting. 30 tablet 0   tiZANidine (ZANAFLEX) 4 MG tablet Take 1 tablet by mouth three times daily as needed for muscle spasm 90 tablet 2   triamcinolone ointment (KENALOG) 0.1 % Apply 1 application topically daily as needed (to affected areas- for psoriasis).  umeclidinium bromide (INCRUSE ELLIPTA) 62.5 MCG/ACT AEPB Inhale 1 puff into the lungs daily. 90 each 3   zolpidem (AMBIEN) 5 MG tablet TAKE 1 TABLET BY MOUTH EVERY DAY AT BEDTIME AS NEEDED 30 tablet 2   No facility-administered medications prior to visit.    Review of Systems  Review of Systems  Constitutional: Negative.  Negative for fever.  Respiratory:  Positive for cough. Negative for chest tightness and wheezing.     Physical Exam  BP 110/62 (BP Location: Left Arm, Patient Position: Sitting, Cuff Size: Normal)    Pulse 94    Temp (!) 97.3 F (36.3 C) (Oral)    Ht 5' (1.524 m)    Wt 92 lb 3.2 oz (41.8 kg)    SpO2 96%    BMI 18.01 kg/m  Physical Exam Constitutional:      General: She is not in acute distress.    Appearance: Normal appearance.  HENT:     Mouth/Throat:     Mouth: Mucous membranes are moist.     Pharynx: Oropharynx is clear.  Cardiovascular:     Rate and Rhythm: Normal rate and regular rhythm.  Pulmonary:     Effort: Pulmonary effort is normal.     Breath sounds: Normal breath sounds. No wheezing.     Comments: Distant rales right middle lobe  Musculoskeletal:        General: Normal range of motion.  Skin:    General: Skin is warm and  dry.  Neurological:     General: No focal deficit present.     Mental Status: She is alert and oriented to person, place, and time. Mental status is at baseline.  Psychiatric:        Mood and Affect: Mood normal.        Behavior: Behavior normal.        Thought Content: Thought content normal.        Judgment: Judgment normal.     Lab Results:  CBC    Component Value Date/Time   WBC 6.7 03/24/2021 1247   WBC 5.5 07/23/2019 0545   RBC 3.60 (L) 03/24/2021 1247   RBC 3.61 (L) 03/24/2021 1246   HGB 10.4 (L) 03/24/2021 1247   HGB 12.3 06/16/2019 1333   HGB 10.7 (L) 04/01/2009 1048   HGB 11.8 05/01/2007 0845   HCT 32.6 (L) 03/24/2021 1247   HCT 37.3 06/16/2019 1333   HCT 32.4 (L) 04/01/2009 1048   HCT 37.7 05/01/2007 0845   PLT 253 03/24/2021 1247   PLT 207 06/16/2019 1333   MCV 90.6 03/24/2021 1247   MCV 87 06/16/2019 1333   MCV 83 04/01/2009 1048   MCV 87.8 05/01/2007 0845   MCH 28.9 03/24/2021 1247   MCHC 31.9 03/24/2021 1247   RDW 14.9 03/24/2021 1247   RDW 15.4 06/16/2019 1333   RDW 13.6 04/01/2009 1048   RDW 13.7 05/01/2007 0845   LYMPHSABS 0.4 (L) 03/24/2021 1247   LYMPHSABS 1.0 04/01/2009 1048   LYMPHSABS 0.6 (L) 05/01/2007 0845   MONOABS 0.7 03/24/2021 1247   MONOABS 0.5 05/01/2007 0845   EOSABS 0.1 03/24/2021 1247   EOSABS 0.1 04/01/2009 1048   BASOSABS 0.1 03/24/2021 1247   BASOSABS 0.0 04/01/2009 1048   BASOSABS 0.0 05/01/2007 0845    BMET    Component Value Date/Time   NA 138 03/24/2021 1247   NA 135 06/16/2019 1333   K 4.0 03/24/2021 1247   CL 99 03/24/2021 1247   CO2 32 03/24/2021 1247  GLUCOSE 100 (H) 03/24/2021 1247   GLUCOSE 104 (H) 03/11/2006 1059   BUN 17 03/24/2021 1247   BUN 31 (H) 06/16/2019 1333   CREATININE 0.87 03/24/2021 1247   CREATININE 0.91 03/16/2016 1539   CALCIUM 9.0 03/24/2021 1247   GFRNONAA >60 03/24/2021 1247   GFRAA >60 12/01/2019 1435    BNP    Component Value Date/Time   BNP 180.8 (H) 03/06/2018 1035     ProBNP    Component Value Date/Time   PROBNP 70.8 06/26/2010 1355    Imaging: DG Chest 2 View  Result Date: 04/10/2021 CLINICAL DATA:  Right-sided pleuritic chest pain. History of bronchiectasis. EXAM: CHEST - 2 VIEW COMPARISON:  07/20/2019 radiography.  07/15/2020 CT. FINDINGS: Pacemaker remains in place. Power port in place with the tip in the SVC above the right atrium. Background pattern of underlying bronchiectasis and pulmonary scarring. Acute pneumonia present on the right, probably in the right middle lobe and with lesser patchy involvement of the right upper lobe. No pleural effusion. IMPRESSION: Background pulmonary scarring and bronchiectasis. Acute pneumonia in the right mid lung, probably in the right middle lobe. Electronically Signed   By: Nelson Chimes M.D.   On: 04/10/2021 10:27   CUP PACEART REMOTE DEVICE CHECK  Result Date: 03/17/2021 Scheduled remote reviewed. Normal device function.  Next remote 91 days. LR    Assessment & Plan:   Bronchiectasis ([place]) - Patient reports new pleuritic pain on right side x 1 week with associated NP cough. She is not consistently using flutter valve or taking mucinex. Recommend checking CXR today, depending on results we will likely send in abx for acute exacerbation. Advised she return to see ID as she is overdue for follow-up.   Recommendations:  - Resume Flutter valve 3 times a day on lowest setting - Take mucinex 643m twice daily with glass of water - You are overdue for follow-up with Dr. SBaxter Flatterywith Infectious disease (due November 2022), please call and set up visit with her  - Depending on CXR results we may send in antibiotic course   Orders: - CXR re: pleuritic pain/hx bronchiectasis (ordered)  Follow-up: - First available with Dr. SHalford Chessmanin 1-3 months    COPD with emphysema ([place] - Does not appear exacerbated. 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 Stopped treatment in June 2022 d.t anorexia, observing off antibiotic.   Recurrent pneumonia - Patient had rales right middle lobe on exam  - CXR obtained today showed acute pneumonia in the right mid lung, probably in the right middle lobe - Sending in course Levaquin 5028mqd x 7 days   40 mins spent on case; > 50% face to face    ElMartyn EhrichNP 04/10/2021

## 2021-04-10 NOTE — Progress Notes (Signed)
Reviewed and agree with assessment/plan.   Chesley Mires, MD [facility] Pulmonary/Critical Care 04/10/2021, 11:28 AM Pager:  [phone]

## 2021-04-10 NOTE — Assessment & Plan Note (Signed)
-   Patient had rales right middle lobe on exam  - CXR obtained today showed acute pneumonia in the right mid lung, probably in the right middle lobe - Sending in course Levaquin 500mg  qd x 7 days

## 2021-04-10 NOTE — Patient Instructions (Addendum)
°  Recommendations: - Continue Incruse Ellipta two puffs once daily in the morning - Use Albuterol 2 puff every 4-6 hours for breakthrough shortness of breath - Resume Flutter valve 3 times a day on lowest setting - Take mucinex 600mg  twice daily with glass of water - You are overdue for follow-up with Dr. Baxter Flattery with Infectious disease (due November 2022), please call and set up visit with her  - Depending on CXR results we may send in antibiotic course   Orders: - CXR re: pleuritic pain/hx bronchiectasis (ordered)  Follow-up: - First available with Dr. Halford Chessman in 1-3 months

## 2021-04-10 NOTE — Assessment & Plan Note (Signed)
-   HX MAI completed treatment course x 6 months. Stopped treatment in June 2022 d.t anorexia, observing off antibiotic.

## 2021-04-10 NOTE — Assessment & Plan Note (Signed)
-   Does not appear exacerbated. Continue Incruse Ellipta one puff daily

## 2021-04-10 NOTE — Progress Notes (Signed)
CXR showed acute pneumonia right mid lung. I will send in abx. Needs follow-up in 2-4 weeks with repeat imaging.

## 2021-04-11 NOTE — Telephone Encounter (Signed)
Called and spoke with patient and gave her her chest xray results. Also did her follow up in 3 weeks with Beth. And her yearly follow up in [place] with Dr Halford Chessman. Nothing further needed as this time

## 2021-04-13 NOTE — Progress Notes (Signed)
Spoke with pt and she voices understanding. Pt did not have any further questions.

## 2021-04-18 ENCOUNTER — Encounter: Payer: Self-pay | Admitting: Family Medicine

## 2021-04-18 ENCOUNTER — Ambulatory Visit (INDEPENDENT_AMBULATORY_CARE_PROVIDER_SITE_OTHER): Payer: HMO | Admitting: Family Medicine

## 2021-04-18 VITALS — BP 138/76 | HR 98 | Temp 98.0°F | Resp 18 | Ht 60.0 in | Wt 91.6 lb

## 2021-04-18 DIAGNOSIS — I10 Essential (primary) hypertension: Secondary | ICD-10-CM

## 2021-04-18 DIAGNOSIS — J189 Pneumonia, unspecified organism: Secondary | ICD-10-CM | POA: Diagnosis not present

## 2021-04-18 DIAGNOSIS — Z79899 Other long term (current) drug therapy: Secondary | ICD-10-CM | POA: Diagnosis not present

## 2021-04-18 DIAGNOSIS — Z0001 Encounter for general adult medical examination with abnormal findings: Secondary | ICD-10-CM | POA: Diagnosis not present

## 2021-04-18 DIAGNOSIS — R52 Pain, unspecified: Secondary | ICD-10-CM

## 2021-04-18 DIAGNOSIS — E785 Hyperlipidemia, unspecified: Secondary | ICD-10-CM

## 2021-04-18 DIAGNOSIS — G4700 Insomnia, unspecified: Secondary | ICD-10-CM | POA: Diagnosis not present

## 2021-04-18 DIAGNOSIS — Z0000 Encounter for general adult medical examination without abnormal findings: Secondary | ICD-10-CM

## 2021-04-18 MED ORDER — OXYCODONE-ACETAMINOPHEN 10-325 MG PO TABS: 1.0000 | ORAL_TABLET | ORAL | 0 refills | Status: DC | PRN

## 2021-04-18 MED ORDER — ZOLPIDEM TARTRATE 5 MG PO TABS: ORAL_TABLET | ORAL | 2 refills | Status: AC

## 2021-04-18 MED ORDER — LEVOFLOXACIN 500 MG PO TABS: 500.0000 mg | ORAL_TABLET | Freq: Every day | ORAL | 0 refills | Status: DC

## 2021-04-18 NOTE — Progress Notes (Deleted)
Subjective:     [name] is a 76 y.o. female and is here for a comprehensive physical exam. The patient reports {problems:16946}.  Social History   Socioeconomic History   Marital status: Married    Spouse name: Not on file   Number of children: 5   Years of education: Not on file   Highest education level: Not on file  Occupational History   Occupation: disabled    Employer: RETIRED  Tobacco Use   Smoking status: Former    Packs/day: 1.00    Years: 20.00    Pack years: 20.00    Types: Cigarettes    Quit date: 03/19/1986    Years since quitting: 35.1   Smokeless tobacco: Never  Vaping Use   Vaping Use: Never used  Substance and Sexual Activity   Alcohol use: No    Alcohol/week: 0.0 standard drinks   Drug use: No   Sexual activity: Yes  Other Topics Concern   Not on file  Social History Narrative   Occupation: Disabled   Married - second time - biologic daughter (in [place]) from first marriage - adopted 4 3 alive with second marriage   Alcohol Use - no   Illicit Drug Use - no   Patient is a former smoker, quit 1988 x18yrs, <1ppd.    Daily Caffeine Use   01/20/2015 - updated   Social Determinants of Health   Financial Resource Strain: Medium Risk   Difficulty of Paying Living Expenses: Somewhat hard  Food Insecurity: No Food Insecurity   Worried About Charity fundraiser in the Last Year: Never true   Ran Out of Food in the Last Year: Never true  Transportation Needs: No Transportation Needs   Lack of Transportation (Medical): No   Lack of Transportation (Non-Medical): No  Physical Activity: Insufficiently Active   Days of Exercise per Week: 6 days   Minutes of Exercise per Session: 20 min  Stress: No Stress Concern Present   Feeling of Stress : Not at all  Social Connections: Moderately Integrated   Frequency of Communication with Friends and Family: More than three times a week   Frequency of Social Gatherings with Friends and Family: More than three times a  week   Attends Religious Services: More than 4 times per year   Active Member of [employer] or Organizations: No   Attends Music therapist: Never   Marital Status: Married  Human resources officer Violence: Not At Risk   Fear of Current or Ex-Partner: No   Emotionally Abused: No   Physically Abused: No   Sexually Abused: No   Health Maintenance  Topic Date Due   COVID-19 Vaccine (1) Never done   Zoster Vaccines- Shingrix (1 of 2) Never done   MAMMOGRAM  09/14/2015   TETANUS/TDAP  12/08/2028   Pneumonia Vaccine 81+ Years old  Completed   INFLUENZA VACCINE  Completed   DEXA SCAN  Completed   Hepatitis C Screening  Completed   HPV VACCINES  Aged Out    {Common ambulatory SmartLinks:19316}  Review of Systems {ros; complete:30496}   Objective:    {Exam, [phone]}    Assessment:    Healthy female exam. ***     Plan:     See After Visit Summary for Counseling Recommendations

## 2021-04-18 NOTE — Patient Instructions (Signed)
Preventive Care 65 Years and Older, Female °Preventive care refers to lifestyle choices and visits with your health care provider that can promote health and wellness. Preventive care visits are also called wellness exams. °What can I expect for my preventive care visit? °Counseling °Your health care provider may ask you questions about your: °Medical history, including: °Past medical problems. °Family medical history. °Pregnancy and menstrual history. °History of falls. °Current health, including: °Memory and ability to understand (cognition). °Emotional well-being. °Home life and relationship well-being. °Sexual activity and sexual health. °Lifestyle, including: °Alcohol, nicotine or tobacco, and drug use. °Access to firearms. °Diet, exercise, and sleep habits. °Work and work environment. °Sunscreen use. °Safety issues such as seatbelt and bike helmet use. °Physical exam °Your health care provider will check your: °Height and weight. These may be used to calculate your BMI (body mass index). BMI is a measurement that tells if you are at a healthy weight. °Waist circumference. This measures the distance around your waistline. This measurement also tells if you are at a healthy weight and may help predict your risk of certain diseases, such as type 2 diabetes and high blood pressure. °Heart rate and blood pressure. °Body temperature. °Skin for abnormal spots. °What immunizations do I need? °Vaccines are usually given at various ages, according to a schedule. Your health care provider will recommend vaccines for you based on your age, medical history, and lifestyle or other factors, such as travel or where you work. °What tests do I need? °Screening °Your health care provider may recommend screening tests for certain conditions. This may include: °Lipid and cholesterol levels. °Hepatitis C test. °Hepatitis B test. °HIV (human immunodeficiency virus) test. °STI (sexually transmitted infection) testing, if you are at  risk. °Lung cancer screening. °Colorectal cancer screening. °Diabetes screening. This is done by checking your blood sugar (glucose) after you have not eaten for a while (fasting). °Mammogram. Talk with your health care provider about how often you should have regular mammograms. °BRCA-related cancer screening. This may be done if you have a family history of breast, ovarian, tubal, or peritoneal cancers. °Bone density scan. This is done to screen for osteoporosis. °Talk with your health care provider about your test results, treatment options, and if necessary, the need for more tests. °Follow these instructions at home: °Eating and drinking ° °Eat a diet that includes fresh fruits and vegetables, whole grains, lean protein, and low-fat dairy products. Limit your intake of foods with high amounts of sugar, saturated fats, and salt. °Take vitamin and mineral supplements as recommended by your health care provider. °Do not drink alcohol if your health care provider tells you not to drink. °If you drink alcohol: °Limit how much you have to 0-1 drink a day. °Know how much alcohol is in your drink. In the [place] one drink equals one 12 oz bottle of beer (355 mL), one 5 oz glass of wine (148 mL), or one 1½ oz glass of hard liquor (44 mL). °Lifestyle °Brush your teeth every morning and night with fluoride toothpaste. Floss one time each day. °Exercise for at least 30 minutes 5 or more days each week. °Do not use any products that contain nicotine or tobacco. These products include cigarettes, chewing tobacco, and vaping devices, such as e-cigarettes. If you need help quitting, ask your health care provider. °Do not use drugs. °If you are sexually active, practice safe sex. Use a condom or other form of protection in order to prevent STIs. °Take aspirin only as told by your   health care provider. Make sure that you understand how much to take and what form to take. Work with your health care provider to find out whether it  is safe and beneficial for you to take aspirin daily. Ask your health care provider if you need to take a cholesterol-lowering medicine (statin). Find healthy ways to manage stress, such as: Meditation, yoga, or listening to music. Journaling. Talking to a trusted person. Spending time with friends and family. Minimize exposure to UV radiation to reduce your risk of skin cancer. Safety Always wear your seat belt while driving or riding in a vehicle. Do not drive: If you have been drinking alcohol. Do not ride with someone who has been drinking. When you are tired or distracted. While texting. If you have been using any mind-altering substances or drugs. Wear a helmet and other protective equipment during sports activities. If you have firearms in your house, make sure you follow all gun safety procedures. What's next? Visit your health care provider once a year for an annual wellness visit. Ask your health care provider how often you should have your eyes and teeth checked. Stay up to date on all vaccines. This information is not intended to replace advice given to you by your health care provider. Make sure you discuss any questions you have with your health care provider. Document Revised: 08/31/2020 Document Reviewed: 08/31/2020 Elsevier Patient Education  [place].

## 2021-04-18 NOTE — Progress Notes (Signed)
Subjective:     [name] is a 76 y.o. female and is here for a comprehensive physical exam. The patient reports problems - she was recently diagnosed with pneumonia and finished 7 days of levaquin--- she feels little improvement    no fever , no cough  . And still c/o back pain  Social History   Socioeconomic History   Marital status: Married    Spouse name: Not on file   Number of children: 5   Years of education: Not on file   Highest education level: Not on file  Occupational History   Occupation: disabled    Employer: RETIRED  Tobacco Use   Smoking status: Former    Packs/day: 1.00    Years: 20.00    Pack years: 20.00    Types: Cigarettes    Quit date: 03/19/1986    Years since quitting: 35.1   Smokeless tobacco: Never  Vaping Use   Vaping Use: Never used  Substance and Sexual Activity   Alcohol use: No    Alcohol/week: 0.0 standard drinks   Drug use: No   Sexual activity: Yes  Other Topics Concern   Not on file  Social History Narrative   Occupation: Disabled   Married - second time - biologic daughter (in [place]) from first marriage - adopted 4 3 alive with second marriage   Alcohol Use - no   Illicit Drug Use - no   Patient is a former smoker, quit 1988 x77yrs, <1ppd.    Daily Caffeine Use   01/20/2015 - updated   Social Determinants of Health   Financial Resource Strain: Medium Risk   Difficulty of Paying Living Expenses: Somewhat hard  Food Insecurity: No Food Insecurity   Worried About Charity fundraiser in the Last Year: Never true   Ran Out of Food in the Last Year: Never true  Transportation Needs: No Transportation Needs   Lack of Transportation (Medical): No   Lack of Transportation (Non-Medical): No  Physical Activity: Insufficiently Active   Days of Exercise per Week: 6 days   Minutes of Exercise per Session: 20 min  Stress: No Stress Concern Present   Feeling of Stress : Not at all  Social Connections: Moderately Integrated   Frequency of  Communication with Friends and Family: More than three times a week   Frequency of Social Gatherings with Friends and Family: More than three times a week   Attends Religious Services: More than 4 times per year   Active Member of [employer] or Organizations: No   Attends Music therapist: Never   Marital Status: Married  Human resources officer Violence: Not At Risk   Fear of Current or Ex-Partner: No   Emotionally Abused: No   Physically Abused: No   Sexually Abused: No   Health Maintenance  Topic Date Due   COVID-19 Vaccine (1) Never done   Zoster Vaccines- Shingrix (1 of 2) Never done   MAMMOGRAM  09/14/2015   TETANUS/TDAP  12/08/2028   Pneumonia Vaccine 68+ Years old  Completed   INFLUENZA VACCINE  Completed   DEXA SCAN  Completed   Hepatitis C Screening  Completed   HPV VACCINES  Aged Out    The following portions of the patient's history were reviewed and updated as appropriate: She  has a past medical history of Anemia, Arthritis, Atrial fibrillation ([place]), CAD (coronary artery disease), COPD (chronic obstructive pulmonary disease) ([place]), Diverticulitis, Elbow fracture, left (aug 2012), Erythropoietin deficiency anemia (06/19/2018), Essential  hypertension, Fibromyalgia, Gastroparesis, GERD (gastroesophageal reflux disease), H/O: GI bleed, Hepatitis, History of pneumonia, MRSA infection, gastric ulcer, stress fx (10/2009), Iron malabsorption (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 She does not have any pertinent problems on file. She  has a past surgical history that includes Appendectomy; pacemaker placement (12/21/2009); Peg removed; Tonsillectomy (age 12); Interstim implant revision  (03/06/2011); Portacath placement (12/16/2009); Total abdominal hysterectomy; Interstim Implant placement (05/28/2006 - stage I); Interstim implant revision (10/23/2007); Cystoscopy with injection (04/30/2006); Small intestine surgery (05/20/2001); Cardiac catheterization (04/08/2006, 11/16/2009); Cystoscopy (11/11/2008); Cystoscopy w/ retrogrades (11/11/2008); Colectomy; Colectomy (02/04/2000); Gastrocutaneous fistula closure; Exploratory laparotomy (04/27/2009); Port-a-cath removal (07/27/2011); Total hip arthroplasty (08/03/2011); Interstim Implant placement (02/05/2012); Esophagogastroduodenoscopy (N/A, 02/02/2013); Savory dilation (N/A, 02/02/2013); ERCP (N/A, 06/23/2017); ERCP (N/A, 06/24/2017); Cholecystectomy (N/A, 06/27/2017); IR IMAGING GUIDED PORT INSERTION (10/14/2017); Video bronchoscopy (N/A, 05/25/2019); Bronchial washings (05/25/2019); BIV PACEMAKER GENERATOR CHANGEOUT (N/A, 06/19/2019); and TEE without cardioversion (N/A, 07/17/2019). Her family history includes Breast cancer in her maternal aunt; Diabetes in her paternal grandfather; Heart disease in her brother, father, mother, and paternal aunt; Stroke in her mother. She  reports that she quit smoking about 35 years ago. Her smoking use included cigarettes. She has a 20.00 pack-year smoking history. She has never used smokeless tobacco. She reports that she does not drink alcohol and does not use drugs. She has a current medication list which includes the following prescription(s): acetaminophen, albuterol, amlodipine, eliquis, atorvastatin, furosemide, gabapentin, nitrofurantoin (macrocrystal-monohydrate), nitroglycerin, oxygen-helium, pantoprazole, promethazine, tizanidine, triamcinolone ointment, umeclidinium bromide, levofloxacin, oxycodone-acetaminophen, zolpidem, and [DISCONTINUED] iron dextran complex. Current Outpatient Medications on File Prior to Visit  Medication Sig Dispense Refill   acetaminophen (TYLENOL) 650 MG CR tablet Take 650 mg by mouth every 8  (eight) hours as needed for pain.     albuterol (VENTOLIN HFA) 108 (90 Base) MCG/ACT inhaler Inhale 2 puffs into the lungs every 6 (six) hours as needed for wheezing or shortness of breath.     amLODipine (NORVASC) 2.5 MG tablet TAKE 1 TABLET BY MOUTH AT BEDTIME 90 tablet 0   apixaban (ELIQUIS) 5 MG TABS tablet Take 1 tablet (5 mg total) by mouth 2 (two) times daily. 180 tablet 0   atorvastatin (LIPITOR) 20 MG tablet TAKE 1 TABLET BY MOUTH ONCE DAILY AT  6PM 90 tablet 1   furosemide (LASIX) 40 MG tablet TAKE 1 TABLET BY MOUTH ONCE DAILY IN THE MORNING 90 tablet 0   gabapentin (NEURONTIN) 600 MG tablet TAKE 1 TABLET BY MOUTH ONCE DAILY IN THE MORNING, AND AT NOON, AND IN THE EVENING, AND AT BEDTIME 120 tablet 0   nitrofurantoin, macrocrystal-monohydrate, (MACROBID) 100 MG capsule Take 1 capsule (100 mg total) by mouth daily.     nitroGLYCERIN (NITROSTAT) 0.4 MG SL tablet Place 1 tablet (0.4 mg total) under the tongue every 5 (five) minutes as needed. CHEST PAIN 25 tablet 6   OXYGEN Inhale 3 L/min into the lungs continuous.      pantoprazole (PROTONIX) 40 MG tablet Take 1 tablet (40 mg total) by mouth 2 (two) times daily. 180 tablet 3   promethazine (PHENERGAN) 25 MG tablet Take 1 tablet (25 mg total) by mouth every 6 (six) hours as needed for nausea or vomiting. 30 tablet 0   tiZANidine (ZANAFLEX) 4 MG tablet Take 1 tablet by mouth three  times daily as needed for muscle spasm 90 tablet 2   triamcinolone ointment (KENALOG) 0.1 % Apply 1 application topically daily as needed (to affected areas- for psoriasis).      umeclidinium bromide (INCRUSE ELLIPTA) 62.5 MCG/ACT AEPB Inhale 1 puff into the lungs daily. 90 each 3   [DISCONTINUED] iron dextran complex (INFED) 50 MG/ML injection Please give infed infusion (no test dose needed) over 4 hours per pharmacy calculated dose. Ht: 5'2 Wt: 99 pounds Hgb: 12 1 mL 0   No current facility-administered medications on file prior to visit.   She is allergic to  dabigatran etexilate mesylate, augmentin [amoxicillin-pot clavulanate], and talwin [pentazocine]..  Review of Systems Review of Systems  Constitutional: Negative for activity change, appetite change and fatigue.  HENT: Negative for hearing loss, congestion, tinnitus and ear discharge.  dentist q62m Eyes: Negative for visual disturbance (see optho q1y -- vision corrected to 20/20 with glasses).  Respiratory: Negative for cough,  + on O2,  chest tightness  Cardiovascular: Negative for chest pain, palpitations and leg swelling.  Gastrointestinal: Negative for abdominal pain, diarrhea, constipation and abdominal distention.  Genitourinary: Negative for urgency, frequency, decreased urine volume and difficulty urinating.  Musculoskeletal: Negative for back pain, arthralgias and gait problem.  Skin: Negative for color change, pallor and rash.  Neurological: Negative for dizziness, light-headedness, numbness and headaches.  Hematological: Negative for adenopathy. Does not bruise/bleed easily.  Psychiatric/Behavioral: Negative for suicidal ideas, confusion, sleep disturbance, self-injury, dysphoric mood, decreased concentration and agitation.      Objective:.    BP 138/76 (BP Location: Right Arm, Patient Position: Sitting, Cuff Size: Normal)    Pulse 98    Temp 98 F (36.7 C) (Oral)    Resp 18    Ht 5' (1.524 m)    Wt 91 lb 9.6 oz (41.5 kg)    SpO2 98%    BMI 17.89 kg/m  General appearance: alert, cooperative, appears stated age, and no distress Head: Normocephalic, without obvious abnormality, atraumatic Eyes: conjunctivae/corneas clear. PERRL, EOM's intact. Fundi benign. Ears: normal TM's and external ear canals both ears Nose: Nares normal. Septum midline. Mucosa normal. No drainage or sinus tenderness. Throat: lips, mucosa, and tongue normal; teeth and gums normal Neck: no adenopathy, no carotid bruit, no JVD, supple, symmetrical, trachea midline, and thyroid not enlarged, symmetric, no  tenderness/mass/nodules Back: symmetric, no curvature. ROM normal. No CVA tenderness. Lungs: rales RML Heart: regular rate and rhythm, S1, S2 normal, no murmur, click, rub or gallop Abdomen: soft, non-tender; bowel sounds normal; no masses,  no organomegaly Extremities: extremities normal, atraumatic, no cyanosis or edema Pulses: 2+ and symmetric Skin: Skin color, texture, turgor normal. No rashes or lesions Lymph nodes: Cervical, supraclavicular, and axillary nodes normal. Neurologic: Grossly normal    Assessment:    Healthy female exam.      Plan:    Ghm utd Check labs  See After Visit Summary for Counseling Recommendations   1. Pain Slightly worse due to pneumonia - oxyCODONE-acetaminophen (PERCOCET) 10-325 MG tablet; Take 1 tablet by mouth every 4 (four) hours as needed. PAIN  Dispense: 120 tablet; Refill: 0  2. Insomnia, unspecified type Stable  - zolpidem (AMBIEN) 5 MG tablet; TAKE 1 TABLET BY MOUTH EVERY DAY AT BEDTIME AS NEEDED  Dispense: 30 tablet; Refill: 2  3. Pneumonia of right middle lobe due to infectious organism Refill levaquin F/u pulmonary - levofloxacin (LEVAQUIN) 500 MG tablet; Take 1 tablet (500 mg total) by mouth daily.  Dispense: 7 tablet; Refill:  0  4. Hyperlipidemia, unspecified hyperlipidemia type Encourage heart healthy diet such as MIND or DASH diet, increase exercise, avoid trans fats, simple carbohydrates and processed foods, consider a krill or fish or flaxseed oil cap daily.   - CBC with Differential/Platelet; Future - Comprehensive metabolic panel; Future - Lipid panel; Future  5. Primary hypertension Well controlled, no changes to meds. Encouraged heart healthy diet such as the DASH diet and exercise as tolerated.   - CBC with Differential/Platelet; Future - Comprehensive metabolic panel; Future - Lipid panel; Future  6. Preventative health care See above   7. High risk medication use  - Drug Tox Monitor 1 w/Conf, Oral Fld

## 2021-04-19 ENCOUNTER — Other Ambulatory Visit: Payer: Self-pay | Admitting: Orthopaedic Surgery

## 2021-04-19 ENCOUNTER — Telehealth: Payer: Self-pay | Admitting: Orthopaedic Surgery

## 2021-04-19 DIAGNOSIS — R52 Pain, unspecified: Secondary | ICD-10-CM

## 2021-04-19 MED ORDER — METHYLPREDNISOLONE 4 MG PO TABS: ORAL_TABLET | ORAL | 0 refills | Status: DC

## 2021-04-19 NOTE — Telephone Encounter (Signed)
Patient aware, we made her an appt for next Monday but will call back and cancel if the prednisone is helping her pain

## 2021-04-19 NOTE — Telephone Encounter (Signed)
Pt called in on triage line for an appt today with Dr. Ninfa Linden or Artis Delay. She said Dr. Ninfa Linden diagnosed her with a protruding disc. Can't put pressure on her right leg. I told her there were no appts available today for this problem. I told her possible med could be sent in for her while she waits for an appt for f/u.

## 2021-04-21 LAB — DRUG TOX MONITOR 1 W/CONF, ORAL FLD
Amphetamines: NEGATIVE ng/mL (ref <10)
Barbiturates: NEGATIVE ng/mL (ref <10)
Benzodiazepines: NEGATIVE ng/mL (ref <0.50)
Buprenorphine: NEGATIVE ng/mL (ref <0.10)
Cocaine: NEGATIVE ng/mL (ref <5.0)
Codeine: NEGATIVE ng/mL (ref <2.5)
Dihydrocodeine: NEGATIVE ng/mL (ref <2.5)
Fentanyl: NEGATIVE ng/mL (ref <0.10)
Heroin Metabolite: NEGATIVE ng/mL (ref <1.0)
Hydrocodone: NEGATIVE ng/mL (ref <2.5)
Hydromorphone: NEGATIVE ng/mL (ref <2.5)
MARIJUANA: NEGATIVE ng/mL (ref <2.5)
MDMA: NEGATIVE ng/mL (ref <10)
Meprobamate: NEGATIVE ng/mL (ref <2.5)
Methadone: NEGATIVE ng/mL (ref <5.0)
Morphine: NEGATIVE ng/mL (ref <2.5)
Nicotine Metabolite: NEGATIVE ng/mL (ref <5.0)
Norhydrocodone: NEGATIVE ng/mL (ref <2.5)
Noroxycodone: 31.2 ng/mL — ABNORMAL HIGH (ref <2.5)
Opiates: POSITIVE ng/mL — AB (ref <2.5)
Oxycodone: 89.3 ng/mL — ABNORMAL HIGH (ref <2.5)
Oxymorphone: NEGATIVE ng/mL (ref <2.5)
Phencyclidine: NEGATIVE ng/mL (ref <10)
Tapentadol: NEGATIVE ng/mL (ref <5.0)
Tramadol: NEGATIVE ng/mL (ref <5.0)
Zolpidem: NEGATIVE ng/mL (ref <5.0)

## 2021-04-24 ENCOUNTER — Ambulatory Visit (INDEPENDENT_AMBULATORY_CARE_PROVIDER_SITE_OTHER): Payer: HMO

## 2021-04-24 ENCOUNTER — Other Ambulatory Visit: Payer: Self-pay | Admitting: Physical Medicine and Rehabilitation

## 2021-04-24 ENCOUNTER — Other Ambulatory Visit: Payer: Self-pay

## 2021-04-24 ENCOUNTER — Ambulatory Visit (INDEPENDENT_AMBULATORY_CARE_PROVIDER_SITE_OTHER): Payer: HMO | Admitting: Orthopaedic Surgery

## 2021-04-24 ENCOUNTER — Other Ambulatory Visit: Payer: Self-pay | Admitting: Family Medicine

## 2021-04-24 DIAGNOSIS — M545 Low back pain, unspecified: Secondary | ICD-10-CM | POA: Diagnosis not present

## 2021-04-24 DIAGNOSIS — G8929 Other chronic pain: Secondary | ICD-10-CM | POA: Diagnosis not present

## 2021-04-24 DIAGNOSIS — R52 Pain, unspecified: Secondary | ICD-10-CM

## 2021-04-24 DIAGNOSIS — M5416 Radiculopathy, lumbar region: Secondary | ICD-10-CM

## 2021-04-24 NOTE — Progress Notes (Signed)
The patient is a 76 year old female we have seen before.  She is very frail and on supplemental oxygen.  She has been having right-sided low back pain which is new to her.  We have had a CT scan of her lumbar spine in the past that showed severe stenosis at L4-L5 more to the left.  She had had injections by Dr. Ernestina Patches twice in the past to the left side and those helped greatly.  She tried a steroid taper and that did help some.  She is on chronic oxycodone.  She says is very painful to walk and is in the right buttocks area where she hurts.  On exam her right hip which is a total hip moves smoothly and fluidly.  There is no pain of the trochanteric area at all.  Her pain seems to be at the lower facet joints to the right side and some over the SI joint.  2 views lumbar spine are compared to previous films and showed no acute findings.  There is degenerative changes with spondylolisthesis that is degenerative L4-L5 and a degenerative scoliosis.  Previous films do show degenerative changes of the SI joint to the right side.  I would like to send her to Dr. Ernestina Patches for an intervention in her spine.  It looks like this needs to be to the right side at either L5-S1 facets or the SI joint considering where her pain is clinically.  There was not a radicular component of her pain.

## 2021-04-25 ENCOUNTER — Inpatient Hospital Stay: Payer: HMO | Admitting: Family

## 2021-04-25 ENCOUNTER — Inpatient Hospital Stay: Payer: HMO | Attending: Family

## 2021-04-25 ENCOUNTER — Other Ambulatory Visit: Payer: Self-pay

## 2021-04-25 ENCOUNTER — Inpatient Hospital Stay: Payer: HMO

## 2021-04-25 ENCOUNTER — Other Ambulatory Visit: Payer: HMO

## 2021-04-25 ENCOUNTER — Ambulatory Visit: Payer: HMO | Admitting: Family

## 2021-04-25 ENCOUNTER — Encounter: Payer: Self-pay | Admitting: Family

## 2021-04-25 VITALS — BP 157/66 | HR 90 | Temp 97.3°F | Resp 18 | Ht 60.0 in | Wt 89.6 lb

## 2021-04-25 DIAGNOSIS — D500 Iron deficiency anemia secondary to blood loss (chronic): Secondary | ICD-10-CM

## 2021-04-25 DIAGNOSIS — D631 Anemia in chronic kidney disease: Secondary | ICD-10-CM | POA: Insufficient documentation

## 2021-04-25 DIAGNOSIS — N183 Chronic kidney disease, stage 3 unspecified: Secondary | ICD-10-CM | POA: Diagnosis not present

## 2021-04-25 LAB — CBC WITH DIFFERENTIAL (CANCER CENTER ONLY)
Abs Immature Granulocytes: 0.35 10*3/uL — ABNORMAL HIGH (ref 0.00–0.07)
Basophils Absolute: 0 10*3/uL (ref 0.0–0.1)
Basophils Relative: 0 %
Eosinophils Absolute: 0.1 10*3/uL (ref 0.0–0.5)
Eosinophils Relative: 1 %
HCT: 31.1 % — ABNORMAL LOW (ref 36.0–46.0)
Hemoglobin: 9.8 g/dL — ABNORMAL LOW (ref 12.0–15.0)
Immature Granulocytes: 3 %
Lymphocytes Relative: 4 %
Lymphs Abs: 0.5 10*3/uL — ABNORMAL LOW (ref 0.7–4.0)
MCH: 28.1 pg (ref 26.0–34.0)
MCHC: 31.5 g/dL (ref 30.0–36.0)
MCV: 89.1 fL (ref 80.0–100.0)
Monocytes Absolute: 1 10*3/uL (ref 0.1–1.0)
Monocytes Relative: 7 %
Neutro Abs: 11.4 10*3/uL — ABNORMAL HIGH (ref 1.7–7.7)
Neutrophils Relative %: 85 %
Platelet Count: 349 10*3/uL (ref 150–400)
RBC: 3.49 MIL/uL — ABNORMAL LOW (ref 3.87–5.11)
RDW: 15.6 % — ABNORMAL HIGH (ref 11.5–15.5)
WBC Count: 13.4 10*3/uL — ABNORMAL HIGH (ref 4.0–10.5)
nRBC: 0 % (ref 0.0–0.2)

## 2021-04-25 LAB — CMP (CANCER CENTER ONLY)
ALT: 7 U/L (ref 0–44)
AST: 20 U/L (ref 15–41)
Albumin: 3.9 g/dL (ref 3.5–5.0)
Alkaline Phosphatase: 116 U/L (ref 38–126)
Anion gap: 8 (ref 5–15)
BUN: 20 mg/dL (ref 8–23)
CO2: 29 mmol/L (ref 22–32)
Calcium: 8.8 mg/dL — ABNORMAL LOW (ref 8.9–10.3)
Chloride: 99 mmol/L (ref 98–111)
Creatinine: 0.73 mg/dL (ref 0.44–1.00)
GFR, Estimated: >60 mL/min (ref >60)
Glucose, Bld: 110 mg/dL — ABNORMAL HIGH (ref 70–99)
Potassium: 4.5 mmol/L (ref 3.5–5.1)
Sodium: 136 mmol/L (ref 135–145)
Total Bilirubin: 0.6 mg/dL (ref 0.3–1.2)
Total Protein: 7 g/dL (ref 6.5–8.1)

## 2021-04-25 LAB — RETICULOCYTES
Immature Retic Fract: 16.3 % — ABNORMAL HIGH (ref 2.3–15.9)
RBC.: 3.51 MIL/uL — ABNORMAL LOW (ref 3.87–5.11)
Retic Count, Absolute: 55.1 10*3/uL (ref 19.0–186.0)
Retic Ct Pct: 1.6 % (ref 0.4–3.1)

## 2021-04-25 LAB — IRON AND IRON BINDING CAPACITY (CC-WL,HP ONLY)
Iron: 45 ug/dL (ref 28–170)
Saturation Ratios: 18 % (ref 10.4–31.8)
TIBC: 256 ug/dL (ref 250–450)
UIBC: 211 ug/dL (ref 148–442)

## 2021-04-25 MED ORDER — HEPARIN SOD (PORK) LOCK FLUSH 100 UNIT/ML IV SOLN
500.0000 [IU] | Freq: Once | INTRAVENOUS | Status: AC | PRN
  Administered 2021-04-25: 500 [IU]

## 2021-04-25 MED ORDER — EPOETIN ALFA-EPBX 40000 UNIT/ML IJ SOLN
40000.0000 [IU] | Freq: Once | INTRAMUSCULAR | Status: AC
  Administered 2021-04-25: 40000 [IU] via SUBCUTANEOUS
  Filled 2021-04-25: qty 1

## 2021-04-25 MED ORDER — SODIUM CHLORIDE 0.9% FLUSH
10.0000 mL | INTRAVENOUS | Status: DC | PRN
  Administered 2021-04-25: 10 mL

## 2021-04-25 NOTE — Patient Instructions (Signed)
Epoetin Alfa injection °What is this medication? °EPOETIN ALFA (e POE e tin AL fa) helps your body make more red blood cells. This medicine is used to treat anemia caused by chronic kidney disease, cancer chemotherapy, or HIV-therapy. It may also be used before surgery if you have anemia. °This medicine may be used for other purposes; ask your health care provider or pharmacist if you have questions. °COMMON BRAND NAME(S): Epogen, Procrit, Retacrit °What should I tell my care team before I take this medication? °They need to know if you have any of these conditions: °cancer °heart disease °high blood pressure °history of blood clots °history of stroke °low levels of folate, iron, or vitamin B12 in the blood °seizures °an unusual or allergic reaction to erythropoietin, albumin, benzyl alcohol, hamster proteins, other medicines, foods, dyes, or preservatives °pregnant or trying to get pregnant °breast-feeding °How should I use this medication? °This medicine is for injection into a vein or under the skin. It is usually given by a health care professional in a hospital or clinic setting. °If you get this medicine at home, you will be taught how to prepare and give this medicine. Use exactly as directed. Take your medicine at regular intervals. Do not take your medicine more often than directed. °It is important that you put your used needles and syringes in a special sharps container. Do not put them in a trash can. If you do not have a sharps container, call your pharmacist or healthcare provider to get one. °A special MedGuide will be given to you by the pharmacist with each prescription and refill. Be sure to read this information carefully each time. °Talk to your pediatrician regarding the use of this medicine in children. While this drug may be prescribed for selected conditions, precautions do apply. °Overdosage: If you think you have taken too much of this medicine contact a poison control center or emergency  room at once. °NOTE: This medicine is only for you. Do not share this medicine with others. °What if I miss a dose? °If you miss a dose, take it as soon as you can. If it is almost time for your next dose, take only that dose. Do not take double or extra doses. °What may interact with this medication? °Interactions have not been studied. °This list may not describe all possible interactions. Give your health care provider a list of all the medicines, herbs, non-prescription drugs, or dietary supplements you use. Also tell them if you smoke, drink alcohol, or use illegal drugs. Some items may interact with your medicine. °What should I watch for while using this medication? °Your condition will be monitored carefully while you are receiving this medicine. °You may need blood work done while you are taking this medicine. °This medicine may cause a decrease in vitamin B6. You should make sure that you get enough vitamin B6 while you are taking this medicine. Discuss the foods you eat and the vitamins you take with your health care professional. °What side effects may I notice from receiving this medication? °Side effects that you should report to your doctor or health care professional as soon as possible: °allergic reactions like skin rash, itching or hives, swelling of the face, lips, or tongue °seizures °signs and symptoms of a blood clot such as breathing problems; changes in vision; chest pain; severe, sudden headache; pain, swelling, warmth in the leg; trouble speaking; sudden numbness or weakness of the face, arm or leg °signs and symptoms of a stroke like   changes in vision; confusion; trouble speaking or understanding; severe headaches; sudden numbness or weakness of the face, arm or leg; trouble walking; dizziness; loss of balance or coordination °Side effects that usually do not require medical attention (report to your doctor or health care professional if they continue or are  bothersome): °chills °cough °dizziness °fever °headaches °joint pain °muscle cramps °muscle pain °nausea, vomiting °pain, redness, or irritation at site where injected °This list may not describe all possible side effects. Call your doctor for medical advice about side effects. You may report side effects to FDA at 1-800-FDA-1088. °Where should I keep my medication? °Keep out of the reach of children. °Store in a refrigerator between 2 and 8 degrees C (36 and 46 degrees F). Do not freeze or shake. Throw away any unused portion if using a single-dose vial. Multi-dose vials can be kept in the refrigerator for up to 21 days after the initial dose. Throw away unused medicine. °NOTE: This sheet is a summary. It may not cover all possible information. If you have questions about this medicine, talk to your doctor, pharmacist, or health care provider. °© 2022 Elsevier/Gold Standard (2016-11-06 00:00:00) ° °

## 2021-04-25 NOTE — Progress Notes (Signed)
Hematology and Oncology Follow Up Visit  [name] [id number] April 27, 1945 76 y.o. 04/25/2021   Principle Diagnosis:  Iron deficiency anemia Erythropoietin deficient anemia    Current Therapy:        IV iron as indicated  Retacrit 40,000 units SQ for hgb < 11   Interim History:  [name] is here today for follow-up and injection. She is recuperating from pneumonia. She is currently on her second round of antibiotics and completed her medrol dose pack today. Lung sounds are diminished slightly on the right side upper and lower lobes.  SOB has been increased and she is sore in her right side. She increased her supplemental O2 to 4L and pulmonology is aware.  No fever, chills, n/v, rash, dizziness, palpitations, abdominal pain or changes in bowel or bladder habits.  No swelling, tenderness, numbness or tingling in her extremities at this time.  No falls or syncope to report.  Appetite is down. She is doing her best to stay well hydrated. Weight is 89 lbs.   ECOG Performance Status: 2 - Symptomatic, <50% confined to bed  Medications:  Allergies as of 04/25/2021       Reactions   Dabigatran Etexilate Mesylate Other (See Comments)   INTERNAL BLEEDING- Pradaxa   Augmentin [amoxicillin-pot Clavulanate] Diarrhea, Nausea Only   Did it involve swelling of the face/tongue/throat, SOB, or low BP? No Did it involve sudden or severe rash/hives, skin peeling, or any reaction on the inside of your mouth or nose? No Did you need to seek medical attention at a hospital or doctor's office? No When did it last happen? 02/2019 If all above answers are "NO", may proceed with cephalosporin use.   Talwin [pentazocine] Other (See Comments)   Hallucinations        Medication List        Accurate as of April 25, 2021  1:18 PM. If you have any questions, ask your nurse or doctor.          STOP taking these medications    levofloxacin 500 MG tablet Commonly known as: LEVAQUIN Stopped  by: Lottie Dawson, NP   methylPREDNISolone 4 MG tablet Commonly known as: Medrol Stopped by: Lottie Dawson, NP       TAKE these medications    acetaminophen 650 MG CR tablet Commonly known as: TYLENOL Take 650 mg by mouth every 8 (eight) hours as needed for pain.   albuterol 108 (90 Base) MCG/ACT inhaler Commonly known as: VENTOLIN HFA Inhale 2 puffs into the lungs every 6 (six) hours as needed for wheezing or shortness of breath.   amLODipine 2.5 MG tablet Commonly known as: NORVASC TAKE 1 TABLET BY MOUTH AT BEDTIME   atorvastatin 20 MG tablet Commonly known as: LIPITOR TAKE 1 TABLET BY MOUTH ONCE DAILY AT  6PM   Eliquis 5 MG Tabs tablet Generic drug: apixaban Take 1 tablet (5 mg total) by mouth 2 (two) times daily.   furosemide 40 MG tablet Commonly known as: LASIX TAKE 1 TABLET BY MOUTH ONCE DAILY IN THE MORNING   gabapentin 600 MG tablet Commonly known as: NEURONTIN TAKE 1 TABLET BY MOUTH ONCE DAILY IN THE MORNING, AND  AT  NOON,  AND  IN  THE  EVENING,  AND  AT  BEDTIME   nitrofurantoin (macrocrystal-monohydrate) 100 MG capsule Commonly known as: Macrobid Take 1 capsule (100 mg total) by mouth daily.   nitroGLYCERIN 0.4 MG SL tablet Commonly known as: NITROSTAT Place 1 tablet (0.4 mg total)  under the tongue every 5 (five) minutes as needed. CHEST PAIN   oxyCODONE-acetaminophen 10-325 MG tablet Commonly known as: PERCOCET Take 1 tablet by mouth every 4 (four) hours as needed. PAIN   OXYGEN Inhale 3 L/min into the lungs continuous.   pantoprazole 40 MG tablet Commonly known as: PROTONIX Take 1 tablet (40 mg total) by mouth 2 (two) times daily.   promethazine 25 MG tablet Commonly known as: PHENERGAN Take 1 tablet (25 mg total) by mouth every 6 (six) hours as needed for nausea or vomiting.   tiZANidine 4 MG tablet Commonly known as: ZANAFLEX Take 1 tablet by mouth three times daily as needed for muscle spasm   triamcinolone ointment 0.1 % Commonly  known as: KENALOG Apply 1 application topically daily as needed (to affected areas- for psoriasis).   umeclidinium bromide 62.5 MCG/ACT Aepb Commonly known as: Incruse Ellipta Inhale 1 puff into the lungs daily.   zolpidem 5 MG tablet Commonly known as: AMBIEN TAKE 1 TABLET BY MOUTH EVERY DAY AT BEDTIME AS NEEDED        Allergies:  Allergies  Allergen Reactions   Dabigatran Etexilate Mesylate Other (See Comments)    INTERNAL BLEEDING- Pradaxa   Augmentin [Amoxicillin-Pot Clavulanate] Diarrhea and Nausea Only    Did it involve swelling of the face/tongue/throat, SOB, or low BP? No Did it involve sudden or severe rash/hives, skin peeling, or any reaction on the inside of your mouth or nose? No Did you need to seek medical attention at a hospital or doctor's office? No When did it last happen? 02/2019 If all above answers are "NO", may proceed with cephalosporin use.    Talwin [Pentazocine] Other (See Comments)    Hallucinations     Past Medical History, Surgical history, Social history, and Family History were reviewed and updated.  Review of Systems: All other 10 point review of systems is negative.   Physical Exam:  vitals were not taken for this visit.   Wt Readings from Last 3 Encounters:  04/18/21 91 lb 9.6 oz (41.5 kg)  04/10/21 92 lb 3.2 oz (41.8 kg)  01/23/21 90 lb 12.8 oz (41.2 kg)    Ocular: Sclerae unicteric, pupils equal, round and reactive to light Ear-nose-throat: Oropharynx clear, dentition fair Lymphatic: No cervical or supraclavicular adenopathy Lungs no rales or rhonchi, good excursion bilaterally Heart regular rate and rhythm, no murmur appreciated Abd soft, nontender, positive bowel sounds MSK no focal spinal tenderness, no joint edema Neuro: non-focal, well-oriented, appropriate affect Breasts: Deferred   Lab Results  Component Value Date   WBC 13.4 (H) 04/25/2021   HGB 9.8 (L) 04/25/2021   HCT 31.1 (L) 04/25/2021   MCV 89.1 04/25/2021    PLT 349 04/25/2021   Lab Results  Component Value Date   FERRITIN 917 (H) 03/24/2021   IRON 34 03/24/2021   TIBC 223 (L) 03/24/2021   UIBC 189 03/24/2021   IRONPCTSAT 15 03/24/2021   Lab Results  Component Value Date   RETICCTPCT 1.6 04/25/2021   RBC 3.51 (L) 04/25/2021   RETICCTABS 64.6 04/01/2009   No results found for: KPAFRELGTCHN, LAMBDASER, KAPLAMBRATIO No results found for: IGGSERUM, IGA, IGMSERUM No results found for: Ronnald Ramp, A1GS, A2GS, Violet Baldy, MSPIKE, SPEI   Chemistry      Component Value Date/Time   NA 138 03/24/2021 1247   NA 135 06/16/2019 1333   K 4.0 03/24/2021 1247   CL 99 03/24/2021 1247   CO2 32 03/24/2021 1247   BUN 17  03/24/2021 1247   BUN 31 (H) 06/16/2019 1333   CREATININE 0.87 03/24/2021 1247   CREATININE 0.91 03/16/2016 1539      Component Value Date/Time   CALCIUM 9.0 03/24/2021 1247   ALKPHOS 126 03/24/2021 1247   AST 22 03/24/2021 1247   ALT 10 03/24/2021 1247   BILITOT 0.5 03/24/2021 1247       Impression and Plan: Ms. Fagin is a very pleasant 76 yo caucasian female with iron deficiency anemia secondary to malabsorption and possible GI blood loss with Eliquis.  Iron studies are pending.  ESA given, Hgb 9.8.  Monthly lab and injection and follow-up in 3 months.   Lottie Dawson, NP 2/7/20231:18 PM

## 2021-04-25 NOTE — Patient Instructions (Signed)

## 2021-04-26 ENCOUNTER — Encounter: Payer: Self-pay | Admitting: Primary Care

## 2021-04-26 ENCOUNTER — Ambulatory Visit: Payer: HMO | Admitting: Primary Care

## 2021-04-26 ENCOUNTER — Ambulatory Visit (INDEPENDENT_AMBULATORY_CARE_PROVIDER_SITE_OTHER): Payer: HMO

## 2021-04-26 VITALS — BP 106/58 | HR 82 | Temp 97.8°F | Ht 60.0 in | Wt 89.0 lb

## 2021-04-26 DIAGNOSIS — D72829 Elevated white blood cell count, unspecified: Secondary | ICD-10-CM | POA: Diagnosis not present

## 2021-04-26 DIAGNOSIS — J849 Interstitial pulmonary disease, unspecified: Secondary | ICD-10-CM

## 2021-04-26 DIAGNOSIS — I5032 Chronic diastolic (congestive) heart failure: Secondary | ICD-10-CM

## 2021-04-26 DIAGNOSIS — J189 Pneumonia, unspecified organism: Secondary | ICD-10-CM

## 2021-04-26 LAB — FERRITIN: Ferritin: 1325 ng/mL — ABNORMAL HIGH (ref 11–307)

## 2021-04-26 NOTE — Progress Notes (Signed)
_0  ID: [name], female    DOB: 23-Nov-1945, 76 y.o.   MRN: [id number]  Chief Complaint  Patient presents with   Follow-up    Pt had an appt with cancer center yesterday 2/7 and was told that her WBC was elevated.     Referring provider: Ann Held, *  HPI: 76 year old female, former smoker. PMH significant for bronchiectasis, COPD with emphysema, recurrent pneumonia, chronic respiratory failure. Patient of Dr. Halford Chessman, last seen in April 2022. Maintained on flutter valve and mucinex prn. Spiriva changed to [employer] requirements.   Previous [facility] pulmonary encounter: 04/10/2021 Patient present today for acute OV. She was last seen in April 2022, overdue for 2 month follow-up. HRCT in April 2022 showed changes from MAI. Continues to follow with Dr. Baxter Flattery with ID, last seen in July 2022.  She has been on therapt for 6 months, stopped in June d.t anorexia. Continues with observation off antibiotic.   She reports pain on right side under her shoulder blade x 1-2 weeks. She feels like she needs to cough but can't because of the pain. She occasionally get up mucus, unsure color. Her breathing is baseline, she is no more short of breath than usual. She is compliant with Incruse inhaler. She uses her flutter valve once a week. She does not take mucinex regularly. She is wearing 2L supplemental oxygen. Denies f/c/s, chest pain, wheezing, falls or trauma.   CXR showed acute pneumonia right mid lung. I will send in abx. Needs follow-up in 2-4 weeks with repeat imaging  04/26/2021- Interim hx  Patient presents today for 2 week follow-up pneumonia. CXR 04/10/21 showed acute pneumonia right mid lung, treated with Levaquin 53m qd x 7 days. She's doing some better today. She still gets a little short winded but is better. She is now able to cough up a small amount of mucus, unsure color but denies foul taste. She is not having as much pain in her lung area. She is using 4L  oxygen as needed. Wbc was elevated on lab work with oncology, she was on oral steroids at that time. Recommend repeating off prednisone in 2 weeks. No fevers. Needs repeat imaging.   Allergies  Allergen Reactions   Dabigatran Etexilate Mesylate Other (See Comments)    INTERNAL BLEEDING- Pradaxa   Augmentin [Amoxicillin-Pot Clavulanate] Diarrhea and Nausea Only    Did it involve swelling of the face/tongue/throat, SOB, or low BP? No Did it involve sudden or severe rash/hives, skin peeling, or any reaction on the inside of your mouth or nose? No Did you need to seek medical attention at a hospital or doctor's office? No When did it last happen? 02/2019 If all above answers are "NO", may proceed with cephalosporin use.    Talwin [Pentazocine] Other (See Comments)    Hallucinations     Immunization History  Administered Date(s) Administered   DTaP 11/03/1996, 01/04/1997, 05/03/1997, 10/19/1997, 08/05/2001   Fluad Quad(high Dose 65+) 11/06/2018, 12/31/2019, 01/16/2021   HPV Quadrivalent 09/05/2009, 09/13/2010, 09/26/2011   Hepatitis A, Ped/Adol-2 Dose 09/21/2004, 03/29/2005   Hepatitis B, ped/adol 04/30/1996, 11/03/1996, 10/19/1997   HiB (PRP-T) 11/03/1996, 01/04/1997, 05/03/1997, 10/19/1997   IPV 11/03/1996, 01/04/1997, 05/03/1997, 08/05/2001   Influenza Split 01/16/2011, 12/12/2011   Influenza Whole 01/15/2007, 12/08/2007, 12/28/2008, 12/17/2009   Influenza, High Dose Seasonal PF 11/28/2015, 12/07/2016, 11/15/2017   Influenza, Seasonal, Injecte, Preservative Fre 12/09/2012   Influenza,Quad,Nasal, Live 03/17/2007, 03/06/2009, 01/08/2010, 01/30/2011, 02/09/2012, 01/18/2013   Influenza,inj,Quad PF,6+ Mos  03/29/2005, 04/30/2005, 03/01/2006, 01/28/2008, 12/01/2013, 01/16/2014, 12/22/2014, 12/28/2014   MMR 05/03/1997, 08/05/2001   Meningococcal B, OMV 12/22/2014   Pneumococcal Conjugate-13 06/10/2014, 11/15/2014   Pneumococcal Polysaccharide-23 12/17/2009   Tdap 09/17/2007, 05/15/2014,  12/09/2018   Varicella 11/16/1997, 05/18/2008   Zoster, Live 12/31/2013    Past Medical History:  Diagnosis Date   Anemia    Arthritis    Atrial fibrillation (HCC)    CAD (coronary artery disease)    Mild disease per cath in 2011   COPD (chronic obstructive pulmonary disease) ([place])    Home O2   Diverticulitis    Elbow fracture, left aug 2012   Erythropoietin deficiency anemia 06/19/2018   Essential hypertension    Fibromyalgia    Gastroparesis    GERD (gastroesophageal reflux disease)    H/O: GI bleed    from Pradaxa   Hepatitis    Hx of in high school    History of pneumonia    Recurrent   Hx MRSA infection    Hx of gastric ulcer    Hx of stress fx 10/2009   Right hip    Iron malabsorption 08/14/2017   LBBB (left bundle branch block)    Oxygen dependent    2 liters via nasal cannula at all times   Pacemaker    CRT therapy; followed by Dr. Caryl Comes   Pleural effusion     s/p right thoracentesis 03/09   Primary dilated cardiomyopathy (HCC)    EF 45 to 50% per echo in Jan 2012   RLS (restless legs syndrome)    Silent aspiration    Small bowel obstruction (HCC)    Urinary incontinence    Venous embolism and thrombosis of subclavian vein (HCC)    After pacemaker insertion Oct 2011   Vitamin B12 deficiency    Wears glasses    Reading    Tobacco History: Social History   Tobacco Use  Smoking Status Former   Packs/day: 1.00   Years: 20.00   Pack years: 20.00   Types: Cigarettes   Quit date: 03/19/1986   Years since quitting: 35.1  Smokeless Tobacco Never   Counseling given: Not Answered   Outpatient Medications Prior to Visit  Medication Sig Dispense Refill   acetaminophen (TYLENOL) 650 MG CR tablet Take 650 mg by mouth every 8 (eight) hours as needed for pain.     albuterol (VENTOLIN HFA) 108 (90 Base) MCG/ACT inhaler Inhale 2 puffs into the lungs every 6 (six) hours as needed for wheezing or shortness of breath.     amLODipine (NORVASC) 2.5 MG tablet TAKE 1  TABLET BY MOUTH AT BEDTIME 90 tablet 0   apixaban (ELIQUIS) 5 MG TABS tablet Take 1 tablet (5 mg total) by mouth 2 (two) times daily. 180 tablet 0   atorvastatin (LIPITOR) 20 MG tablet TAKE 1 TABLET BY MOUTH ONCE DAILY AT  6PM 90 tablet 1   furosemide (LASIX) 40 MG tablet TAKE 1 TABLET BY MOUTH ONCE DAILY IN THE MORNING 90 tablet 0   gabapentin (NEURONTIN) 600 MG tablet TAKE 1 TABLET BY MOUTH ONCE DAILY IN THE MORNING, AND  AT  NOON,  AND  IN  THE  EVENING,  AND  AT  BEDTIME 120 tablet 0   nitrofurantoin, macrocrystal-monohydrate, (MACROBID) 100 MG capsule Take 1 capsule (100 mg total) by mouth daily.     nitroGLYCERIN (NITROSTAT) 0.4 MG SL tablet Place 1 tablet (0.4 mg total) under the tongue every 5 (five) minutes as needed. CHEST PAIN 25  tablet 6   oxyCODONE-acetaminophen (PERCOCET) 10-325 MG tablet Take 1 tablet by mouth every 4 (four) hours as needed. PAIN 120 tablet 0   OXYGEN Inhale 3 L/min into the lungs continuous.      pantoprazole (PROTONIX) 40 MG tablet Take 1 tablet (40 mg total) by mouth 2 (two) times daily. 180 tablet 3   promethazine (PHENERGAN) 25 MG tablet Take 1 tablet (25 mg total) by mouth every 6 (six) hours as needed for nausea or vomiting. 30 tablet 0   tiZANidine (ZANAFLEX) 4 MG tablet Take 1 tablet by mouth three times daily as needed for muscle spasm 90 tablet 2   triamcinolone ointment (KENALOG) 0.1 % Apply 1 application topically daily as needed (to affected areas- for psoriasis).      umeclidinium bromide (INCRUSE ELLIPTA) 62.5 MCG/ACT AEPB Inhale 1 puff into the lungs daily. 90 each 3   zolpidem (AMBIEN) 5 MG tablet TAKE 1 TABLET BY MOUTH EVERY DAY AT BEDTIME AS NEEDED 30 tablet 2   No facility-administered medications prior to visit.    Review of Systems  Review of Systems  Constitutional: Negative.   HENT: Negative.    Respiratory:  Positive for cough and shortness of breath.     Physical Exam  BP (!) 106/58 (BP Location: Left Arm, Patient Position:  Sitting, Cuff Size: Normal)    Pulse 82    Temp 97.8 F (36.6 C) (Oral)    Ht 5' (1.524 m)    Wt 89 lb (40.4 kg)    SpO2 96% Comment: 4L pulse   BMI 17.38 kg/m  Physical Exam Constitutional:      General: She is not in acute distress.    Appearance: Normal appearance. She is not ill-appearing.  HENT:     Mouth/Throat:     Mouth: Mucous membranes are moist.     Pharynx: Oropharynx is clear.  Cardiovascular:     Rate and Rhythm: Normal rate and regular rhythm.  Pulmonary:     Effort: Pulmonary effort is normal. No respiratory distress.     Breath sounds: No wheezing.     Comments: Distant rales right middle lobe Musculoskeletal:        General: Normal range of motion.  Skin:    General: Skin is warm and dry.  Neurological:     General: No focal deficit present.     Mental Status: She is alert and oriented to person, place, and time. Mental status is at baseline.  Psychiatric:        Mood and Affect: Mood normal.        Behavior: Behavior normal.        Thought Content: Thought content normal.        Judgment: Judgment normal.     Lab Results:  CBC    Component Value Date/Time   WBC 13.4 (H) 04/25/2021 1300   WBC 5.5 07/23/2019 0545   RBC 3.51 (L) 04/25/2021 1302   RBC 3.49 (L) 04/25/2021 1300   HGB 9.8 (L) 04/25/2021 1300   HGB 12.3 06/16/2019 1333   HGB 10.7 (L) 04/01/2009 1048   HGB 11.8 05/01/2007 0845   HCT 31.1 (L) 04/25/2021 1300   HCT 37.3 06/16/2019 1333   HCT 32.4 (L) 04/01/2009 1048   HCT 37.7 05/01/2007 0845   PLT 349 04/25/2021 1300   PLT 207 06/16/2019 1333   MCV 89.1 04/25/2021 1300   MCV 87 06/16/2019 1333   MCV 83 04/01/2009 1048   MCV 87.8 05/01/2007 0845  MCH 28.1 04/25/2021 1300   MCHC 31.5 04/25/2021 1300   RDW 15.6 (H) 04/25/2021 1300   RDW 15.4 06/16/2019 1333   RDW 13.6 04/01/2009 1048   RDW 13.7 05/01/2007 0845   LYMPHSABS 0.5 (L) 04/25/2021 1300   LYMPHSABS 1.0 04/01/2009 1048   LYMPHSABS 0.6 (L) 05/01/2007 0845   MONOABS 1.0  04/25/2021 1300   MONOABS 0.5 05/01/2007 0845   EOSABS 0.1 04/25/2021 1300   EOSABS 0.1 04/01/2009 1048   BASOSABS 0.0 04/25/2021 1300   BASOSABS 0.0 04/01/2009 1048   BASOSABS 0.0 05/01/2007 0845    BMET    Component Value Date/Time   NA 136 04/25/2021 1300   NA 135 06/16/2019 1333   K 4.5 04/25/2021 1300   CL 99 04/25/2021 1300   CO2 29 04/25/2021 1300   GLUCOSE 110 (H) 04/25/2021 1300   GLUCOSE 104 (H) 03/11/2006 1059   BUN 20 04/25/2021 1300   BUN 31 (H) 06/16/2019 1333   CREATININE 0.73 04/25/2021 1300   CREATININE 0.91 03/16/2016 1539   CALCIUM 8.8 (L) 04/25/2021 1300   GFRNONAA >60 04/25/2021 1300   GFRAA >60 12/01/2019 1435    BNP    Component Value Date/Time   BNP 180.8 (H) 03/06/2018 1035    ProBNP    Component Value Date/Time   PROBNP 70.8 06/26/2010 1355    Imaging: DG Chest 2 View  Result Date: 04/26/2021 CLINICAL DATA:  Follow-up of right-sided pneumonia EXAM: CHEST - 2 VIEW COMPARISON:  04/10/2021 FINDINGS: Right Port-A-Cath tip at low SVC. Pacer with leads at right atrium, right ventricle, coronary sinus. Midline trachea. Normal heart size. Atherosclerosis in the transverse aorta. Trace right pleural fluid or thickening blunts the costophrenic angle. No pneumothorax. Underlying pattern of diffuse marked interstitial thickening with scattered more confluent opacities bilaterally. The right mid lung consolidation is similar to minimally improved. Felt to be within the inferior right upper lobe and possibly the right middle lobe when correlated with the lateral view. No new pulmonary opacities identified. Osteopenia. IMPRESSION: Similar to minimal improvement in right-sided pneumonia, in the setting of diffuse chronic atypical/mycobacterial infection. Aortic Atherosclerosis (ICD10-I70.0). Trace right pleural fluid or thickening, similar. Electronically Signed   By: Abigail Miyamoto M.D.   On: 04/26/2021 11:52   DG Chest 2 View  Result Date: 04/10/2021 CLINICAL  DATA:  Right-sided pleuritic chest pain. History of bronchiectasis. EXAM: CHEST - 2 VIEW COMPARISON:  07/20/2019 radiography.  07/15/2020 CT. FINDINGS: Pacemaker remains in place. Power port in place with the tip in the SVC above the right atrium. Background pattern of underlying bronchiectasis and pulmonary scarring. Acute pneumonia present on the right, probably in the right middle lobe and with lesser patchy involvement of the right upper lobe. No pleural effusion. IMPRESSION: Background pulmonary scarring and bronchiectasis. Acute pneumonia in the right mid lung, probably in the right middle lobe. Electronically Signed   By: Nelson Chimes M.D.   On: 04/10/2021 10:27   XR Hand Complete Right  Result Date: 05/01/2021 3 views of the left wrist show a distal radius and distal ulnar fracture with dorsal angulation and shortening.  XR Lumbar Spine 2-3 Views  Result Date: 04/24/2021 2 views lumbar spine show no acute findings.  There is L4 on L5 chronic spondylolisthesis that has not changed from previous films.  There is a degenerative scoliosis as well.    Assessment & Plan:   Recurrent pneumonia - Clinically improving, completed course Levaquin - Advised she continue Mucinex twice daily and use flutter valve  three times a day  Orders: CXR today   Follow-up: 2 months with Dr. Halford Chessman (recall should already be in)  Chronic diastolic CHF (congestive heart failure) ([place]) - Advised patient to take prn lasix 33m twice this week    Leukocytosis - Wbc was elevated on lab work with oncology, she was on oral steroids at that time. Recommend repeating off prednisone in 2 weeks.      EMartyn Ehrich NP 05/02/2021

## 2021-04-26 NOTE — Patient Instructions (Signed)
Recommendations: Continue Mucinex twice daily Continue to use flutter valve three times a day Take lasix 40mg  twice this week  Labs with oncology in 2-3 weeks, if white count is elevated off steroids please let us know   Orders: CXR today   Follow-up: 2 months with Dr. Halford Chessman (recall should already be in)

## 2021-04-27 ENCOUNTER — Telehealth: Payer: Self-pay | Admitting: Pulmonary Disease

## 2021-04-27 NOTE — Progress Notes (Signed)
CXR showed similar to minimal improvement in right sided pneumonia. Please send in additional 3 days Levaquin 500mg  daily.

## 2021-04-28 ENCOUNTER — Telehealth: Payer: Self-pay | Admitting: *Deleted

## 2021-04-28 MED ORDER — LEVOFLOXACIN 500 MG PO TABS: 500.0000 mg | ORAL_TABLET | Freq: Every day | ORAL | 0 refills | Status: AC

## 2021-04-28 NOTE — Progress Notes (Signed)
Pt aware of results- see phone note 04/28/21.

## 2021-04-28 NOTE — Telephone Encounter (Signed)
Becky Ehrich, NP  04/27/2021  9:19 AM EST     CXR showed similar to minimal improvement in right sided pneumonia. Please send in additional 3 days Levaquin 500mg  daily   Spoke with pt and notified of results per Derl Barrow, NP. Pt verbalized understanding and denied any questions. Rx for levaquin was sent to pharm.

## 2021-04-28 NOTE — Telephone Encounter (Signed)
Per 04/25/21 los - called and gave upcoming appointments - confirmed

## 2021-05-01 ENCOUNTER — Other Ambulatory Visit: Payer: Self-pay

## 2021-05-01 ENCOUNTER — Encounter: Payer: Self-pay | Admitting: Orthopaedic Surgery

## 2021-05-01 ENCOUNTER — Ambulatory Visit (INDEPENDENT_AMBULATORY_CARE_PROVIDER_SITE_OTHER): Payer: HMO

## 2021-05-01 ENCOUNTER — Ambulatory Visit: Payer: HMO | Admitting: Orthopaedic Surgery

## 2021-05-01 DIAGNOSIS — M79641 Pain in right hand: Secondary | ICD-10-CM

## 2021-05-01 DIAGNOSIS — S52531A Colles' fracture of right radius, initial encounter for closed fracture: Secondary | ICD-10-CM

## 2021-05-01 NOTE — Progress Notes (Signed)
The patient is a 76 year old female well-known to me.  She had a mechanical fall this morning slipping and landing on an outstretched right wrist.  She is on oxygen.  She is a frail 76 year old female.  On exam there is significant bruising and swelling about the dorsum of her right hand and wrist.  There is dorsal angulation of an obvious distal radius fracture.  She is neurovascular intact and the skin is intact.  3 views of the right hand and wrist show a dorsally angulated extra-articular distal radius fracture with shortening.  The distal ulna is also fractured.  I was able to manipulate her wrist into a little better position.  She needs a sugar-tong today with elevation and ice.  She is already on oxycodone.  We will see her back next week to transition her to a short arm cast.  We will have a repeat 2 views of the right wrist out of her splint next week.  All questions and concerns were answered and addressed.

## 2021-05-02 DIAGNOSIS — D72829 Elevated white blood cell count, unspecified: Secondary | ICD-10-CM | POA: Insufficient documentation

## 2021-05-02 NOTE — Assessment & Plan Note (Signed)
-   Clinically improving, completed course Levaquin - Advised she continue Mucinex twice daily and use flutter valve three times a day  Orders: CXR today   Follow-up: 2 months with Dr. Halford Chessman (recall should already be in)

## 2021-05-02 NOTE — Assessment & Plan Note (Signed)
-   Wbc was elevated on lab work with oncology, she was on oral steroids at that time. Recommend repeating off prednisone in 2 weeks.

## 2021-05-02 NOTE — Assessment & Plan Note (Signed)
-   Advised patient to take prn lasix 40mg  twice this week

## 2021-05-03 ENCOUNTER — Telehealth: Payer: Self-pay | Admitting: Physical Medicine and Rehabilitation

## 2021-05-03 NOTE — Telephone Encounter (Signed)
Patient's husband called. Patient would like to schedule with Dr. Ernestina Patches for an injection. Call back number is [phone]

## 2021-05-05 ENCOUNTER — Telehealth: Payer: Self-pay | Admitting: Physical Medicine and Rehabilitation

## 2021-05-05 ENCOUNTER — Telehealth: Payer: Self-pay | Admitting: *Deleted

## 2021-05-05 ENCOUNTER — Telehealth: Payer: Self-pay | Admitting: Family Medicine

## 2021-05-05 ENCOUNTER — Telehealth: Payer: Self-pay

## 2021-05-05 NOTE — Telephone Encounter (Signed)
Patient's husband called asked for a call back. The number to contact Kyung Rudd is [phone]

## 2021-05-05 NOTE — Telephone Encounter (Signed)
FYI

## 2021-05-05 NOTE — Telephone Encounter (Signed)
Pt's husband states she will be getting an injection for back pain and will need to be off her blood thinners 3 days prior. He was unsure of the name of the medication but thinks it is elliquis, and was unsure if this was prescribed by dr.Lowne. He stated she will be getting this done at [facility]. Please advise.

## 2021-05-05 NOTE — Telephone Encounter (Signed)
Pt's Spouse, Kyung Rudd, called stating pt is sleeping a lot, is confused, not talking and may be dehydrated.  He is thinking she may need fluids.  He stated he had called Oncology and asked if pt could get fluids and was advised they could not administer fluids and I advised we could not give fluids either.  I spoke with Heahter, CMA, and she advised pt could possible have a UTI and should be seen in the ER.  I relayed message to Kyung Rudd and he advised he really did not want to go there, but I advised it is early and going now would be less of a wait than later on in the evening.  I also offered to transfer him down to the ER so he could check on the wait times, but he declined.  He stated he would just try to get the patient to drink something.

## 2021-05-05 NOTE — Telephone Encounter (Signed)
Patient's husband called. He wants to know if she should stop taking her blood thinners. His call back number is [phone]

## 2021-05-05 NOTE — Telephone Encounter (Signed)
Forms received. Placed in folder

## 2021-05-05 NOTE — Telephone Encounter (Signed)
Call received from patient's husband stating that pt is "weak, incoherent at times, sleeping a lot and is requesting for pt to receive IVF's."  Kyung Rudd notified per order of Dr. Marin Olp to take pt to the [facility] ER now.  Pt.'s husband states that he does not want to take pt to the ER at this time d/t pt.'s decreased immune system.  Instructed Kyung Rudd again that Dr. Antonieta Pert recommendation is to take pt to the ER at [facility].  This RN does not know if pt.'s husband will follow this recommendation or not.

## 2021-05-05 NOTE — Telephone Encounter (Signed)
Forms placed in folder

## 2021-05-05 NOTE — Telephone Encounter (Signed)
[place] office called in regards to paperwork faxed over on 04/25/21 , just need the paperwork faxed back about the patient taking her blood thinners prior a procedure     [phone]

## 2021-05-08 ENCOUNTER — Other Ambulatory Visit: Payer: Self-pay

## 2021-05-08 ENCOUNTER — Ambulatory Visit (INDEPENDENT_AMBULATORY_CARE_PROVIDER_SITE_OTHER): Payer: HMO

## 2021-05-08 ENCOUNTER — Encounter: Payer: Self-pay | Admitting: Physician Assistant

## 2021-05-08 ENCOUNTER — Ambulatory Visit (INDEPENDENT_AMBULATORY_CARE_PROVIDER_SITE_OTHER): Payer: HMO | Admitting: Physician Assistant

## 2021-05-08 DIAGNOSIS — S52531A Colles' fracture of right radius, initial encounter for closed fracture: Secondary | ICD-10-CM

## 2021-05-08 NOTE — Progress Notes (Signed)
HPI: [name] returns today for follow-up of her right wrist fracture.  She has been in a sugar-tong splint.  She is states overall that the wrist is feeling better.  This point she is now 2 weeks post mechanical fall in which she sustained a wrist fracture.  Physical exam: Right wrist swelling obvious deformity.  There is no tenting skin no skin breakdown.  She is able to wiggle her fingers and has full sensation throughout radial pulses intact.  Radiographs 3 views right wrist shows no change in overall position of the distal radius and ulna fractures.  The radius remains shortened and dorsally angulated.  There is no intra-articular involvement.  No significant consolidation.   Impression: Right wrist fracture  Plan: We will place her in a short arm cast today.  Elevation wiggling fingers encouraged.  We will see her back in 4 weeks for removal of the cast and repeat x-rays at that time.  Questions were encouraged and answered at length.

## 2021-05-09 ENCOUNTER — Ambulatory Visit: Payer: HMO | Admitting: Internal Medicine

## 2021-05-09 NOTE — Telephone Encounter (Signed)
Per PCP, contacted [facility] and requested they contact patients cardiologist for medication change/temporary discontinuation.

## 2021-05-10 ENCOUNTER — Telehealth: Payer: Self-pay | Admitting: Internal Medicine

## 2021-05-10 NOTE — Telephone Encounter (Signed)
° °  Pre-operative Risk Assessment    Patient Name: [name]  DOB: 13-Nov-1945 MRN: [id number]      Request for Surgical Clearance    Procedure:   Interlaminar epidural injection   Date of Surgery:  Clearance 05/22/21                                 Surgeon:  Dr. Nonnie Done Surgeon's Group or Practice Name:  [facility] Phone number:  [phone] Fax number:  [phone]   Type of Clearance Requested:   - Pharmacy:  Hold Apixaban (Eliquis) stop for two days prior    Type of Anesthesia:  None    Additional requests/questions:   n/a  Signed, Kamira J Martinique   05/10/2021, 10:54 AM

## 2021-05-10 NOTE — Telephone Encounter (Signed)
Patient's daughter called and was informed that the request needs to be sent to the pts cardiologist per PCP. She stated she understood and would contact [facility].

## 2021-05-10 NOTE — Telephone Encounter (Signed)
Patient with diagnosis of afib on Eliquis for anticoagulation.    Procedure: Interlaminar epidural injection Date of procedure: 05/22/21   CHA2DS2-VASc Score = 8   This indicates a 10.8% annual risk of stroke. The patient's score is based upon: CHF History: 1 HTN History: 1 Diabetes History: 0 Stroke History: 2 (TIA) Vascular Disease History: 1 Age Score: 2 Gender Score: 1      CrCl 42 ml/min  Patient had TIA when she was not on anticoagulation. Her anticoagulation has been previously held. ACC/AHA recommends against bridging with lovenox in DOAC patients.  Per office protocol, patient can hold Eliquis for 2 days prior to procedure.

## 2021-05-10 NOTE — Telephone Encounter (Signed)
° °  Name: [name]  DOB: 1945-09-26  MRN: [id number]   Primary Cardiologist: Minus Breeding, MD  Chart reviewed as part of pre-operative protocol coverage. Patient was contacted 05/10/2021 in reference to pre-operative risk assessment for pending surgery as outlined below. Permission received from patient to speak with patient's husband. [name] was last seen virtually on 06/23/2020 by Dr. Caryl Comes. Since that day, [name] has been stable from a cardiac standpoint. She denies any new or concerning symptoms.  According to the Revised Cardiac Risk Index (RCRI), her perioperative risk of major cardiac event is 6.6%.  Her METs are 3.63 according to the Duke Activity Status Index (DASI).  Her activity is limited in the setting of orthopedic issues.Therefore, based on ACC/AHA guidelines, the patient would be at acceptable risk for the planned procedure without further cardiovascular testing.   The patient and patient's husband were advised that if she develops new symptoms prior to surgery to contact our office to arrange for a follow-up visit, they verbalized understanding.  Patient is soon to be due for routine outpatient visit. Advised patient's husband to schedule routine follow-up appointment with our office, he verbalized understanding.  Patient with diagnosis of afib on Eliquis for anticoagulation.     Procedure: Interlaminar epidural injection Date of procedure: 05/22/21     CHA2DS2-VASc Score = 8   This indicates a 10.8% annual risk of stroke. The patient's score is based upon: CHF History: 1 HTN History: 1 Diabetes History: 0 Stroke History: 2 (TIA) Vascular Disease History: 1 Age Score: 2 Gender Score: 1       CrCl 42 ml/min   Patient had TIA when she was not on anticoagulation. Her anticoagulation has been previously held. ACC/AHA recommends against bridging with lovenox in DOAC patients.   Per office protocol, patient can hold Eliquis for 2 days prior to  procedure.     I will route this recommendation to the requesting party via Epic fax function and remove from pre-op pool. Please call with questions.  [name], NP 05/10/2021, 1:18 PM

## 2021-05-12 DIAGNOSIS — R300 Dysuria: Secondary | ICD-10-CM | POA: Diagnosis not present

## 2021-05-13 IMAGING — CT CT CHEST HIGH RESOLUTION W/O CM
2 of 7 series · 14 of 36 positions shown, 17 images · non-contrast
Comparison: 07/15/2019, 04/27/2019

CLINICAL DATA: Interstitial lung disease, history of LAKMINI

EXAM:
CT CHEST WITHOUT CONTRAST
TECHNIQUE: Multidetector CT imaging of the chest was performed following the
standard protocol without intravenous contrast. High resolution
imaging of the lungs, as well as inspiratory and expiratory imaging,
was performed.

[Series 4: high resolution · axial · 0.58mm/px · z∈[-286,-46]mm · 11 of 287 slices shown, 14 images]
[im 24/287  mediastinal]
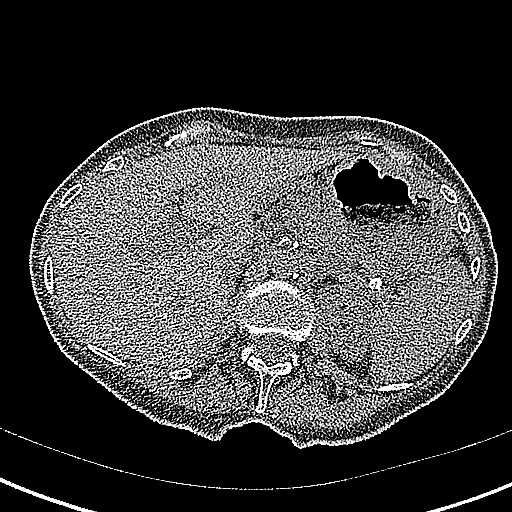
[im 24/287  lung]
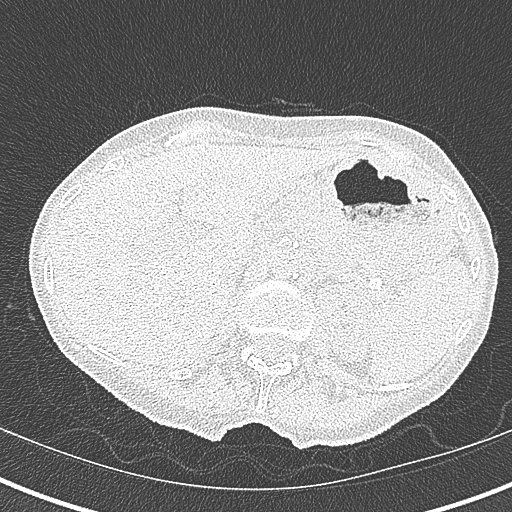
[im 48/287  lung]
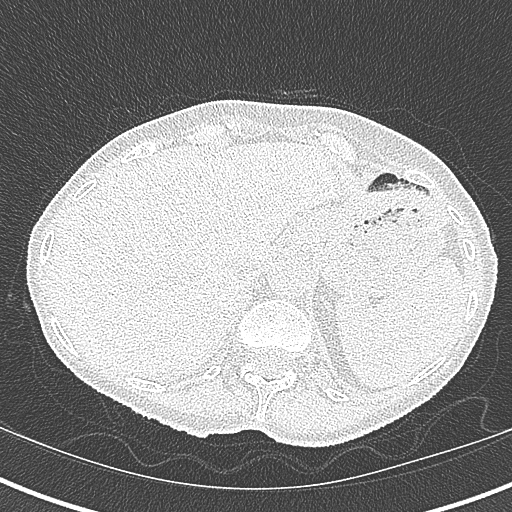
[im 72/287  lung]
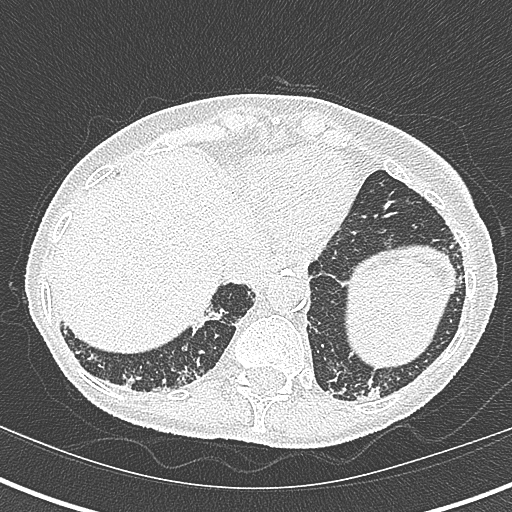
[im 96/287  lung]
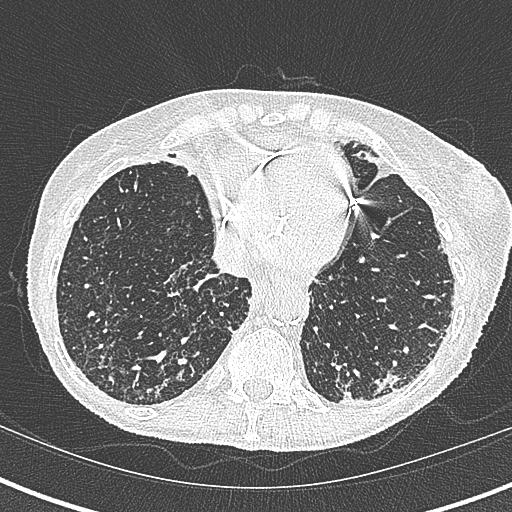
[im 120/287  mediastinal]
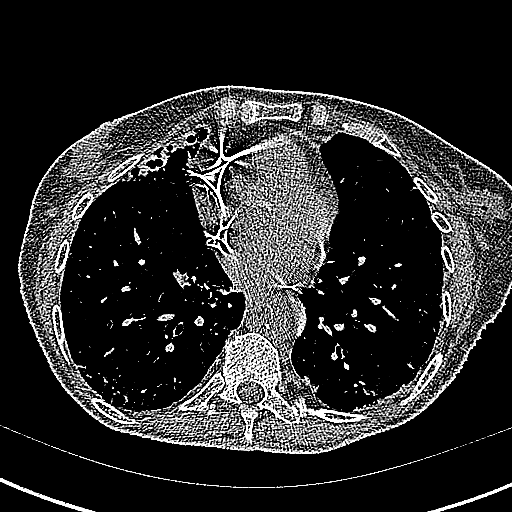
[im 120/287  lung]
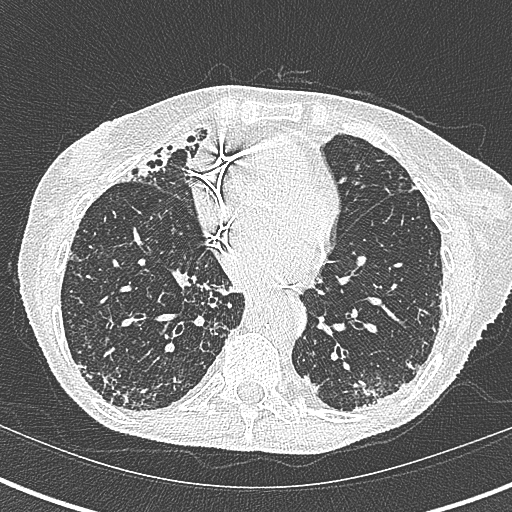
[im 144/287  lung]
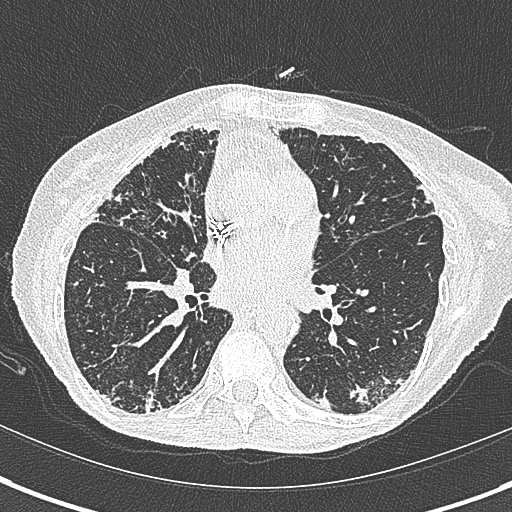
[im 167/287  lung]
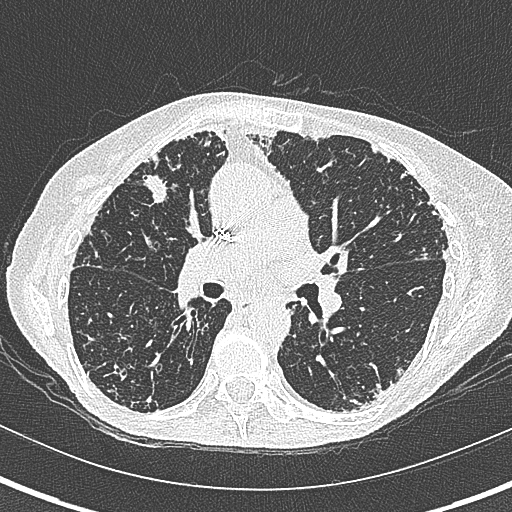
[im 191/287  lung]
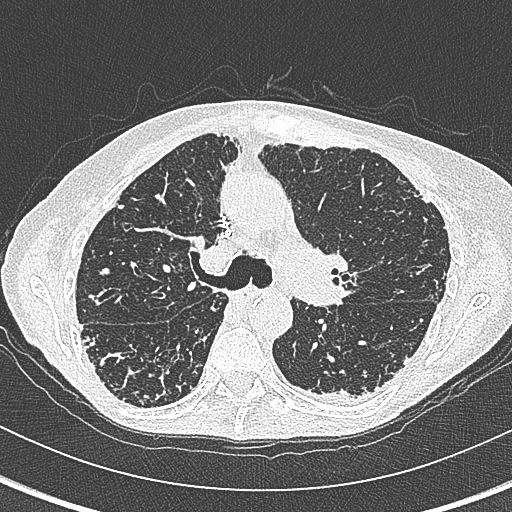
[im 215/287  mediastinal]
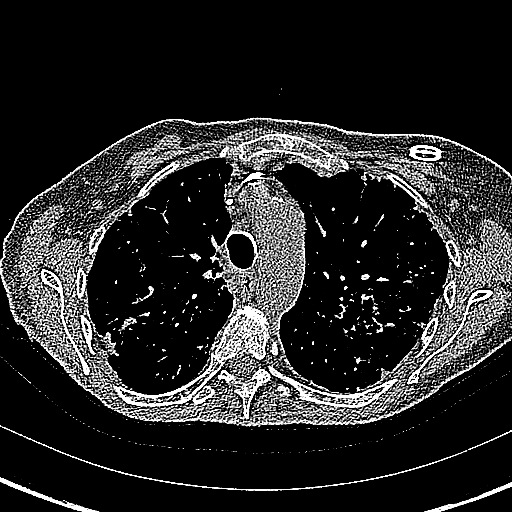
[im 215/287  lung]
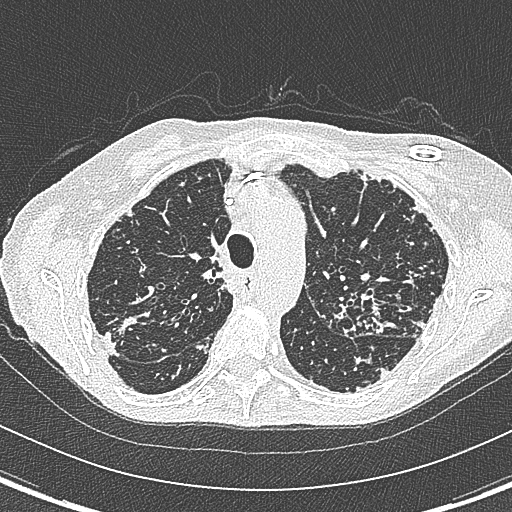
[im 239/287  lung]
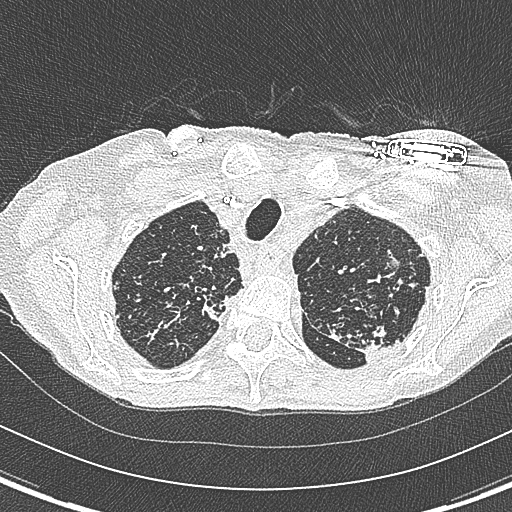
[im 263/287  lung]
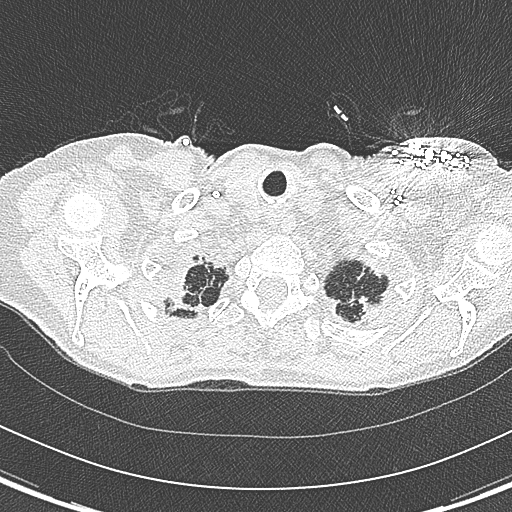

[Series 6: coronal · coronal · 0.53mm/px · 3 of 114 slices shown]
[im 23/114  lung]
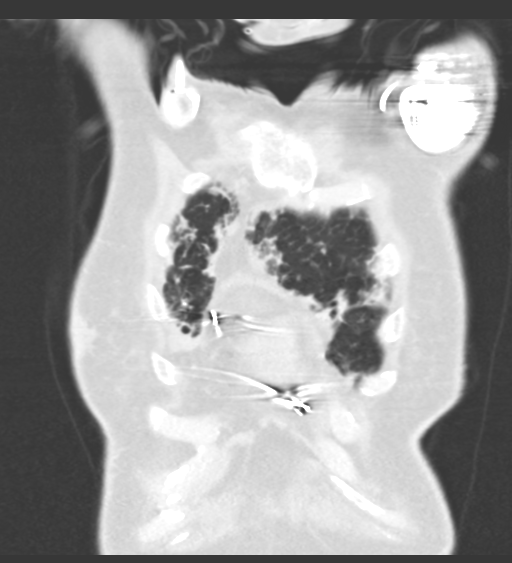
[im 46/114  lung]
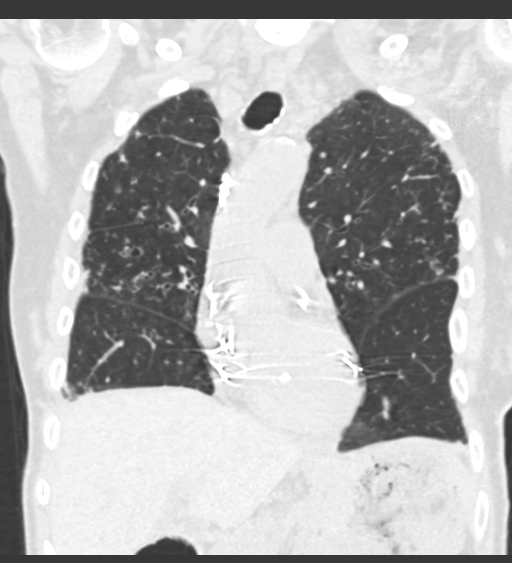
[im 68/114  lung]
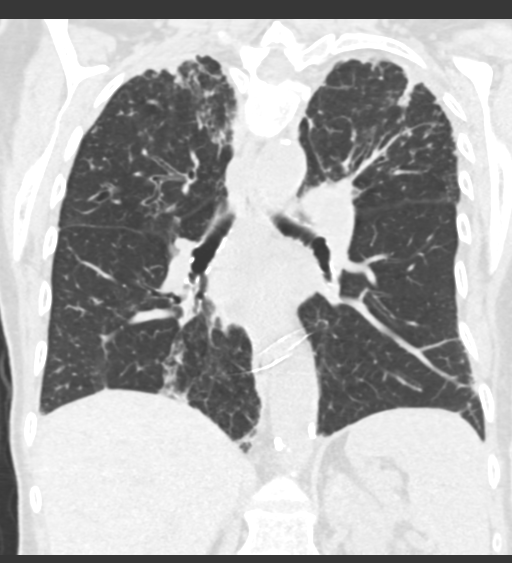

[14 of 36 positions shown; findings below may reference images not displayed]

FINDINGS: Cardiovascular: Right chest port catheter. Left chest multi lead
pacer defibrillator. Normal heart size. Left coronary artery
calcifications. No pericardial effusion.

Mediastinum/Nodes: No enlarged mediastinal, hilar, or axillary lymph
nodes. Thyroid gland, trachea, and esophagus demonstrate no
significant findings.

Lungs/Pleura: Innumerable tiny, clustered centrilobular and
tree-in-bud pulmonary nodules throughout the lungs, as well as mild,
varicoid bronchiectasis and bronchiolectasis throughout and areas of
dominant consolidation, for example in the medial segment right
middle lobe (series 4, image 122) and in the posterior right upper
lobe (series 4, image 80). There is new heterogeneous airspace
opacity and nodularity of the dependent left lower lobe (series 4,
image 152). Findings elsewhere are essentially stable compared to
prior examination. No significant air trapping on expiratory phase
imaging. No pleural effusion or pneumothorax.

Upper Abdomen: No acute abnormality. Redemonstrated rim calcified
aneurysm of the distal splenic artery or a branch vessel in the
splenic hilum measuring approximately 1.0 cm (series 2, image 131).

Musculoskeletal: No chest wall mass or suspicious bone lesions
identified.
IMPRESSION: 1. Innumerable tiny, clustered centrilobular and tree-in-bud
pulmonary nodules throughout the lungs, as well as mild, varicoid
bronchiectasis and bronchiolectasis throughout and occasional areas
of dominant consolidation. There is new heterogeneous airspace
opacity and nodularity of the dependent left lower lobe. Findings
elsewhere are essentially stable compared to prior examination.
Findings are consistent with ongoing atypical mycobacterial
infection.
2. Coronary artery disease.

## 2021-05-15 DIAGNOSIS — R300 Dysuria: Secondary | ICD-10-CM | POA: Diagnosis not present

## 2021-05-15 DIAGNOSIS — N390 Urinary tract infection, site not specified: Secondary | ICD-10-CM | POA: Diagnosis not present

## 2021-05-16 ENCOUNTER — Other Ambulatory Visit: Payer: Self-pay | Admitting: *Deleted

## 2021-05-16 ENCOUNTER — Telehealth: Payer: HMO

## 2021-05-16 DIAGNOSIS — R52 Pain, unspecified: Secondary | ICD-10-CM

## 2021-05-16 MED ORDER — TIZANIDINE HCL 4 MG PO TABS: 4.0000 mg | ORAL_TABLET | Freq: Three times a day (TID) | ORAL | 2 refills | Status: AC | PRN

## 2021-05-17 ENCOUNTER — Other Ambulatory Visit: Payer: Self-pay | Admitting: Family Medicine

## 2021-05-17 DIAGNOSIS — R52 Pain, unspecified: Secondary | ICD-10-CM

## 2021-05-17 MED ORDER — OXYCODONE-ACETAMINOPHEN 10-325 MG PO TABS: 1.0000 | ORAL_TABLET | ORAL | 0 refills | Status: AC | PRN

## 2021-05-17 NOTE — Telephone Encounter (Signed)
Medication: oxyCODONE-acetaminophen (PERCOCET) 10-325 MG tablet ? ?Has the patient contacted their pharmacy? No. ? ? ?Preferred Pharmacy: [facility], [address] [place], [place] [address]  ?Phone:  [phone]  Fax:  [phone]  ? ?

## 2021-05-17 NOTE — Telephone Encounter (Signed)
Requesting: Percocet ?Contract: 04/18/21 ?UDS: 04/18/21 ?Last OV: 04/18/2021 ?Next OV: 07/18/21 ?Last Refill: 04/18/21, #[id number] RF ?Database: ? ? ?Please advise  ? ?

## 2021-05-18 ENCOUNTER — Emergency Department (HOSPITAL_COMMUNITY): Payer: HMO

## 2021-05-18 ENCOUNTER — Ambulatory Visit: Payer: HMO | Admitting: Physical Medicine and Rehabilitation

## 2021-05-18 ENCOUNTER — Ambulatory Visit: Payer: Self-pay

## 2021-05-18 ENCOUNTER — Observation Stay (HOSPITAL_COMMUNITY): Payer: HMO

## 2021-05-18 ENCOUNTER — Encounter: Payer: Self-pay | Admitting: Physical Medicine and Rehabilitation

## 2021-05-18 ENCOUNTER — Encounter (HOSPITAL_COMMUNITY): Payer: Self-pay

## 2021-05-18 ENCOUNTER — Other Ambulatory Visit: Payer: Self-pay

## 2021-05-18 ENCOUNTER — Inpatient Hospital Stay (HOSPITAL_COMMUNITY)
Admission: EM | Admit: 2021-05-18 | Discharge: 2021-06-17 | DRG: 166 | Disposition: E | Payer: HMO | Attending: Student | Admitting: Student

## 2021-05-18 ENCOUNTER — Ambulatory Visit: Payer: HMO | Admitting: Internal Medicine

## 2021-05-18 VITALS — BP 114/71 | HR 125

## 2021-05-18 DIAGNOSIS — R599 Enlarged lymph nodes, unspecified: Secondary | ICD-10-CM

## 2021-05-18 DIAGNOSIS — Z823 Family history of stroke: Secondary | ICD-10-CM

## 2021-05-18 DIAGNOSIS — Z9889 Other specified postprocedural states: Secondary | ICD-10-CM

## 2021-05-18 DIAGNOSIS — C3411 Malignant neoplasm of upper lobe, right bronchus or lung: Secondary | ICD-10-CM | POA: Diagnosis present

## 2021-05-18 DIAGNOSIS — N281 Cyst of kidney, acquired: Secondary | ICD-10-CM | POA: Diagnosis not present

## 2021-05-18 DIAGNOSIS — M797 Fibromyalgia: Secondary | ICD-10-CM | POA: Diagnosis present

## 2021-05-18 DIAGNOSIS — J90 Pleural effusion, not elsewhere classified: Secondary | ICD-10-CM

## 2021-05-18 DIAGNOSIS — D500 Iron deficiency anemia secondary to blood loss (chronic): Secondary | ICD-10-CM

## 2021-05-18 DIAGNOSIS — G2581 Restless legs syndrome: Secondary | ICD-10-CM | POA: Diagnosis present

## 2021-05-18 DIAGNOSIS — R079 Chest pain, unspecified: Secondary | ICD-10-CM | POA: Diagnosis not present

## 2021-05-18 DIAGNOSIS — Z79899 Other long term (current) drug therapy: Secondary | ICD-10-CM

## 2021-05-18 DIAGNOSIS — J439 Emphysema, unspecified: Secondary | ICD-10-CM

## 2021-05-18 DIAGNOSIS — C7951 Secondary malignant neoplasm of bone: Secondary | ICD-10-CM | POA: Diagnosis present

## 2021-05-18 DIAGNOSIS — R627 Adult failure to thrive: Secondary | ICD-10-CM | POA: Diagnosis not present

## 2021-05-18 DIAGNOSIS — I110 Hypertensive heart disease with heart failure: Secondary | ICD-10-CM | POA: Diagnosis present

## 2021-05-18 DIAGNOSIS — D72829 Elevated white blood cell count, unspecified: Secondary | ICD-10-CM

## 2021-05-18 DIAGNOSIS — I10 Essential (primary) hypertension: Secondary | ICD-10-CM | POA: Diagnosis not present

## 2021-05-18 DIAGNOSIS — I4892 Unspecified atrial flutter: Secondary | ICD-10-CM | POA: Diagnosis present

## 2021-05-18 DIAGNOSIS — C771 Secondary and unspecified malignant neoplasm of intrathoracic lymph nodes: Secondary | ICD-10-CM | POA: Diagnosis present

## 2021-05-18 DIAGNOSIS — Z66 Do not resuscitate: Secondary | ICD-10-CM | POA: Diagnosis not present

## 2021-05-18 DIAGNOSIS — Z96641 Presence of right artificial hip joint: Secondary | ICD-10-CM | POA: Diagnosis present

## 2021-05-18 DIAGNOSIS — Z881 Allergy status to other antibiotic agents status: Secondary | ICD-10-CM

## 2021-05-18 DIAGNOSIS — J479 Bronchiectasis, uncomplicated: Secondary | ICD-10-CM | POA: Diagnosis not present

## 2021-05-18 DIAGNOSIS — R296 Repeated falls: Secondary | ICD-10-CM

## 2021-05-18 DIAGNOSIS — J9620 Acute and chronic respiratory failure, unspecified whether with hypoxia or hypercapnia: Secondary | ICD-10-CM | POA: Diagnosis not present

## 2021-05-18 DIAGNOSIS — J869 Pyothorax without fistula: Secondary | ICD-10-CM | POA: Diagnosis not present

## 2021-05-18 DIAGNOSIS — E785 Hyperlipidemia, unspecified: Secondary | ICD-10-CM | POA: Diagnosis present

## 2021-05-18 DIAGNOSIS — K921 Melena: Secondary | ICD-10-CM | POA: Diagnosis present

## 2021-05-18 DIAGNOSIS — Z888 Allergy status to other drugs, medicaments and biological substances status: Secondary | ICD-10-CM

## 2021-05-18 DIAGNOSIS — J9611 Chronic respiratory failure with hypoxia: Secondary | ICD-10-CM | POA: Diagnosis present

## 2021-05-18 DIAGNOSIS — E860 Dehydration: Secondary | ICD-10-CM | POA: Diagnosis not present

## 2021-05-18 DIAGNOSIS — D638 Anemia in other chronic diseases classified elsewhere: Secondary | ICD-10-CM | POA: Diagnosis not present

## 2021-05-18 DIAGNOSIS — K909 Intestinal malabsorption, unspecified: Secondary | ICD-10-CM

## 2021-05-18 DIAGNOSIS — R918 Other nonspecific abnormal finding of lung field: Secondary | ICD-10-CM | POA: Diagnosis present

## 2021-05-18 DIAGNOSIS — N189 Chronic kidney disease, unspecified: Secondary | ICD-10-CM | POA: Diagnosis not present

## 2021-05-18 DIAGNOSIS — R1310 Dysphagia, unspecified: Secondary | ICD-10-CM | POA: Diagnosis present

## 2021-05-18 DIAGNOSIS — I447 Left bundle-branch block, unspecified: Secondary | ICD-10-CM | POA: Diagnosis present

## 2021-05-18 DIAGNOSIS — C187 Malignant neoplasm of sigmoid colon: Secondary | ICD-10-CM | POA: Diagnosis not present

## 2021-05-18 DIAGNOSIS — M5416 Radiculopathy, lumbar region: Secondary | ICD-10-CM | POA: Diagnosis not present

## 2021-05-18 DIAGNOSIS — Z9981 Dependence on supplemental oxygen: Secondary | ICD-10-CM

## 2021-05-18 DIAGNOSIS — A310 Pulmonary mycobacterial infection: Secondary | ICD-10-CM | POA: Diagnosis not present

## 2021-05-18 DIAGNOSIS — I959 Hypotension, unspecified: Secondary | ICD-10-CM | POA: Diagnosis not present

## 2021-05-18 DIAGNOSIS — R590 Localized enlarged lymph nodes: Secondary | ICD-10-CM

## 2021-05-18 DIAGNOSIS — J181 Lobar pneumonia, unspecified organism: Secondary | ICD-10-CM

## 2021-05-18 DIAGNOSIS — K219 Gastro-esophageal reflux disease without esophagitis: Secondary | ICD-10-CM | POA: Diagnosis present

## 2021-05-18 DIAGNOSIS — L98429 Non-pressure chronic ulcer of back with unspecified severity: Secondary | ICD-10-CM

## 2021-05-18 DIAGNOSIS — C3491 Malignant neoplasm of unspecified part of right bronchus or lung: Secondary | ICD-10-CM | POA: Diagnosis not present

## 2021-05-18 DIAGNOSIS — J470 Bronchiectasis with acute lower respiratory infection: Secondary | ICD-10-CM | POA: Diagnosis present

## 2021-05-18 DIAGNOSIS — I5032 Chronic diastolic (congestive) heart failure: Secondary | ICD-10-CM | POA: Diagnosis present

## 2021-05-18 DIAGNOSIS — J9621 Acute and chronic respiratory failure with hypoxia: Secondary | ICD-10-CM | POA: Diagnosis present

## 2021-05-18 DIAGNOSIS — C188 Malignant neoplasm of overlapping sites of colon: Secondary | ICD-10-CM | POA: Diagnosis present

## 2021-05-18 DIAGNOSIS — E440 Moderate protein-calorie malnutrition: Secondary | ICD-10-CM | POA: Diagnosis present

## 2021-05-18 DIAGNOSIS — I4891 Unspecified atrial fibrillation: Secondary | ICD-10-CM | POA: Diagnosis not present

## 2021-05-18 DIAGNOSIS — Z20822 Contact with and (suspected) exposure to covid-19: Secondary | ICD-10-CM | POA: Diagnosis present

## 2021-05-18 DIAGNOSIS — J910 Malignant pleural effusion: Secondary | ICD-10-CM | POA: Diagnosis not present

## 2021-05-18 DIAGNOSIS — I509 Heart failure, unspecified: Secondary | ICD-10-CM | POA: Diagnosis not present

## 2021-05-18 DIAGNOSIS — Z515 Encounter for palliative care: Secondary | ICD-10-CM

## 2021-05-18 DIAGNOSIS — I480 Paroxysmal atrial fibrillation: Secondary | ICD-10-CM | POA: Diagnosis present

## 2021-05-18 DIAGNOSIS — R000 Tachycardia, unspecified: Secondary | ICD-10-CM | POA: Diagnosis not present

## 2021-05-18 DIAGNOSIS — Z681 Body mass index (BMI) 19 or less, adult: Secondary | ICD-10-CM

## 2021-05-18 DIAGNOSIS — R710 Precipitous drop in hematocrit: Secondary | ICD-10-CM | POA: Diagnosis not present

## 2021-05-18 DIAGNOSIS — I420 Dilated cardiomyopathy: Secondary | ICD-10-CM | POA: Diagnosis present

## 2021-05-18 DIAGNOSIS — R531 Weakness: Secondary | ICD-10-CM | POA: Diagnosis not present

## 2021-05-18 DIAGNOSIS — G894 Chronic pain syndrome: Secondary | ICD-10-CM | POA: Diagnosis present

## 2021-05-18 DIAGNOSIS — I5A Non-ischemic myocardial injury (non-traumatic): Secondary | ICD-10-CM

## 2021-05-18 DIAGNOSIS — Z87891 Personal history of nicotine dependence: Secondary | ICD-10-CM

## 2021-05-18 DIAGNOSIS — R64 Cachexia: Secondary | ICD-10-CM | POA: Diagnosis present

## 2021-05-18 DIAGNOSIS — G4700 Insomnia, unspecified: Secondary | ICD-10-CM | POA: Diagnosis present

## 2021-05-18 DIAGNOSIS — C801 Malignant (primary) neoplasm, unspecified: Secondary | ICD-10-CM | POA: Diagnosis not present

## 2021-05-18 DIAGNOSIS — Z980 Intestinal bypass and anastomosis status: Secondary | ICD-10-CM | POA: Diagnosis not present

## 2021-05-18 DIAGNOSIS — M549 Dorsalgia, unspecified: Secondary | ICD-10-CM | POA: Diagnosis not present

## 2021-05-18 DIAGNOSIS — Z7401 Bed confinement status: Secondary | ICD-10-CM

## 2021-05-18 DIAGNOSIS — D649 Anemia, unspecified: Secondary | ICD-10-CM | POA: Diagnosis not present

## 2021-05-18 DIAGNOSIS — Z8249 Family history of ischemic heart disease and other diseases of the circulatory system: Secondary | ICD-10-CM

## 2021-05-18 DIAGNOSIS — C3490 Malignant neoplasm of unspecified part of unspecified bronchus or lung: Secondary | ICD-10-CM | POA: Insufficient documentation

## 2021-05-18 DIAGNOSIS — Z85038 Personal history of other malignant neoplasm of large intestine: Secondary | ICD-10-CM | POA: Diagnosis not present

## 2021-05-18 DIAGNOSIS — D631 Anemia in chronic kidney disease: Secondary | ICD-10-CM | POA: Diagnosis not present

## 2021-05-18 DIAGNOSIS — J449 Chronic obstructive pulmonary disease, unspecified: Secondary | ICD-10-CM | POA: Diagnosis not present

## 2021-05-18 DIAGNOSIS — Z7901 Long term (current) use of anticoagulants: Secondary | ICD-10-CM | POA: Diagnosis not present

## 2021-05-18 DIAGNOSIS — Z803 Family history of malignant neoplasm of breast: Secondary | ICD-10-CM

## 2021-05-18 DIAGNOSIS — K6389 Other specified diseases of intestine: Secondary | ICD-10-CM | POA: Diagnosis not present

## 2021-05-18 DIAGNOSIS — J189 Pneumonia, unspecified organism: Secondary | ICD-10-CM | POA: Insufficient documentation

## 2021-05-18 DIAGNOSIS — D62 Acute posthemorrhagic anemia: Secondary | ICD-10-CM | POA: Diagnosis present

## 2021-05-18 DIAGNOSIS — L8990 Pressure ulcer of unspecified site, unspecified stage: Secondary | ICD-10-CM | POA: Diagnosis present

## 2021-05-18 DIAGNOSIS — I2510 Atherosclerotic heart disease of native coronary artery without angina pectoris: Secondary | ICD-10-CM | POA: Diagnosis not present

## 2021-05-18 DIAGNOSIS — J9690 Respiratory failure, unspecified, unspecified whether with hypoxia or hypercapnia: Secondary | ICD-10-CM | POA: Diagnosis not present

## 2021-05-18 DIAGNOSIS — M48062 Spinal stenosis, lumbar region with neurogenic claudication: Secondary | ICD-10-CM

## 2021-05-18 DIAGNOSIS — R778 Other specified abnormalities of plasma proteins: Secondary | ICD-10-CM | POA: Diagnosis not present

## 2021-05-18 DIAGNOSIS — K922 Gastrointestinal hemorrhage, unspecified: Principal | ICD-10-CM

## 2021-05-18 DIAGNOSIS — R0602 Shortness of breath: Secondary | ICD-10-CM | POA: Diagnosis not present

## 2021-05-18 DIAGNOSIS — I482 Chronic atrial fibrillation, unspecified: Secondary | ICD-10-CM | POA: Diagnosis present

## 2021-05-18 DIAGNOSIS — L89159 Pressure ulcer of sacral region, unspecified stage: Secondary | ICD-10-CM | POA: Diagnosis not present

## 2021-05-18 DIAGNOSIS — R54 Age-related physical debility: Secondary | ICD-10-CM | POA: Diagnosis present

## 2021-05-18 DIAGNOSIS — L89150 Pressure ulcer of sacral region, unstageable: Secondary | ICD-10-CM | POA: Diagnosis present

## 2021-05-18 DIAGNOSIS — J9601 Acute respiratory failure with hypoxia: Secondary | ICD-10-CM | POA: Diagnosis not present

## 2021-05-18 DIAGNOSIS — E538 Deficiency of other specified B group vitamins: Secondary | ICD-10-CM | POA: Diagnosis present

## 2021-05-18 DIAGNOSIS — Z833 Family history of diabetes mellitus: Secondary | ICD-10-CM

## 2021-05-18 DIAGNOSIS — K2970 Gastritis, unspecified, without bleeding: Secondary | ICD-10-CM | POA: Diagnosis present

## 2021-05-18 DIAGNOSIS — Z86718 Personal history of other venous thrombosis and embolism: Secondary | ICD-10-CM

## 2021-05-18 DIAGNOSIS — L89152 Pressure ulcer of sacral region, stage 2: Secondary | ICD-10-CM | POA: Diagnosis present

## 2021-05-18 DIAGNOSIS — Z950 Presence of cardiac pacemaker: Secondary | ICD-10-CM

## 2021-05-18 DIAGNOSIS — N390 Urinary tract infection, site not specified: Secondary | ICD-10-CM | POA: Diagnosis present

## 2021-05-18 DIAGNOSIS — C189 Malignant neoplasm of colon, unspecified: Secondary | ICD-10-CM

## 2021-05-18 DIAGNOSIS — Z7189 Other specified counseling: Secondary | ICD-10-CM | POA: Diagnosis not present

## 2021-05-18 DIAGNOSIS — B59 Pneumocystosis: Secondary | ICD-10-CM | POA: Diagnosis not present

## 2021-05-18 DIAGNOSIS — D509 Iron deficiency anemia, unspecified: Secondary | ICD-10-CM | POA: Diagnosis not present

## 2021-05-18 DIAGNOSIS — C785 Secondary malignant neoplasm of large intestine and rectum: Secondary | ICD-10-CM | POA: Diagnosis not present

## 2021-05-18 DIAGNOSIS — Z8619 Personal history of other infectious and parasitic diseases: Secondary | ICD-10-CM

## 2021-05-18 LAB — COMPREHENSIVE METABOLIC PANEL
ALT: 12 U/L (ref 0–44)
AST: 52 U/L — ABNORMAL HIGH (ref 15–41)
Albumin: 2.9 g/dL — ABNORMAL LOW (ref 3.5–5.0)
Alkaline Phosphatase: 115 U/L (ref 38–126)
Anion gap: 12 (ref 5–15)
BUN: 30 mg/dL — ABNORMAL HIGH (ref 8–23)
CO2: 29 mmol/L (ref 22–32)
Calcium: 8.9 mg/dL (ref 8.9–10.3)
Chloride: 97 mmol/L — ABNORMAL LOW (ref 98–111)
Creatinine, Ser: 1.03 mg/dL — ABNORMAL HIGH (ref 0.44–1.00)
GFR, Estimated: 56 mL/min — ABNORMAL LOW (ref >60)
Glucose, Bld: 99 mg/dL (ref 70–99)
Potassium: 4.1 mmol/L (ref 3.5–5.1)
Sodium: 138 mmol/L (ref 135–145)
Total Bilirubin: 1.2 mg/dL (ref 0.3–1.2)
Total Protein: 6.7 g/dL (ref 6.5–8.1)

## 2021-05-18 LAB — CBC WITH DIFFERENTIAL/PLATELET
Abs Immature Granulocytes: 0.63 10*3/uL — ABNORMAL HIGH (ref 0.00–0.07)
Basophils Absolute: 0 10*3/uL (ref 0.0–0.1)
Basophils Relative: 0 %
Eosinophils Absolute: 0 10*3/uL (ref 0.0–0.5)
Eosinophils Relative: 0 %
HCT: 20.3 % — ABNORMAL LOW (ref 36.0–46.0)
Hemoglobin: 6 g/dL — CL (ref 12.0–15.0)
Immature Granulocytes: 6 %
Lymphocytes Relative: 4 %
Lymphs Abs: 0.4 10*3/uL — ABNORMAL LOW (ref 0.7–4.0)
MCH: 27.6 pg (ref 26.0–34.0)
MCHC: 29.6 g/dL — ABNORMAL LOW (ref 30.0–36.0)
MCV: 93.5 fL (ref 80.0–100.0)
Monocytes Absolute: 0.9 10*3/uL (ref 0.1–1.0)
Monocytes Relative: 8 %
Neutro Abs: 8.9 10*3/uL — ABNORMAL HIGH (ref 1.7–7.7)
Neutrophils Relative %: 82 %
Platelets: 308 10*3/uL (ref 150–400)
RBC: 2.17 MIL/uL — ABNORMAL LOW (ref 3.87–5.11)
RDW: 19.9 % — ABNORMAL HIGH (ref 11.5–15.5)
WBC: 10.8 10*3/uL — ABNORMAL HIGH (ref 4.0–10.5)
nRBC: 0.8 % — ABNORMAL HIGH (ref 0.0–0.2)

## 2021-05-18 LAB — BLOOD GAS, VENOUS
Acid-Base Excess: 5.1 mmol/L — ABNORMAL HIGH (ref 0.0–2.0)
Bicarbonate: 30.4 mmol/L — ABNORMAL HIGH (ref 20.0–28.0)
O2 Saturation: 73.2 %
Patient temperature: 37
pCO2, Ven: 49 mmHg (ref 44–60)
pH, Ven: 7.4 (ref 7.25–7.43)
pO2, Ven: 42 mmHg (ref 32–45)

## 2021-05-18 LAB — POC OCCULT BLOOD, ED: Fecal Occult Bld: POSITIVE — AB

## 2021-05-18 LAB — RESP PANEL BY RT-PCR (FLU A&B, COVID) ARPGX2
Influenza A by PCR: NEGATIVE
Influenza B by PCR: NEGATIVE
SARS Coronavirus 2 by RT PCR: NEGATIVE

## 2021-05-18 LAB — RESPIRATORY PANEL BY PCR

## 2021-05-18 LAB — PROTIME-INR
INR: 1.4 — ABNORMAL HIGH (ref 0.8–1.2)
Prothrombin Time: 16.7 seconds — ABNORMAL HIGH (ref 11.4–15.2)

## 2021-05-18 LAB — LACTIC ACID, PLASMA: Lactic Acid, Venous: 0.8 mmol/L (ref 0.5–1.9)

## 2021-05-18 LAB — PREPARE RBC (CROSSMATCH)

## 2021-05-18 MED ORDER — PANTOPRAZOLE INFUSION (NEW) - SIMPLE MED
8.0000 mg/h | INTRAVENOUS | Status: DC
Start: 2021-05-18 — End: 2021-05-20
  Administered 2021-05-18 – 2021-05-20 (×4): 8 mg/h via INTRAVENOUS
  Filled 2021-05-18: qty 100
  Filled 2021-05-18: qty 80
  Filled 2021-05-18: qty 100
  Filled 2021-05-18 (×2): qty 80

## 2021-05-18 MED ORDER — MORPHINE SULFATE (PF) 4 MG/ML IV SOLN
4.0000 mg | Freq: Once | INTRAVENOUS | Status: AC
Start: 2021-05-18 — End: 2021-05-18
  Administered 2021-05-18: 4 mg via INTRAVENOUS
  Filled 2021-05-18: qty 1

## 2021-05-18 MED ORDER — SODIUM CHLORIDE 0.9% FLUSH: 10.0000 mL | INTRAVENOUS | Status: DC | PRN

## 2021-05-18 MED ORDER — UMECLIDINIUM BROMIDE 62.5 MCG/ACT IN AEPB
1.0000 | INHALATION_SPRAY | Freq: Every day | RESPIRATORY_TRACT | Status: DC
  Administered 2021-05-19 – 2021-05-28 (×10): 1 via RESPIRATORY_TRACT
  Filled 2021-05-18 (×3): qty 7

## 2021-05-18 MED ORDER — HYDRALAZINE HCL 20 MG/ML IJ SOLN
10.0000 mg | Freq: Four times a day (QID) | INTRAMUSCULAR | Status: DC | PRN
  Administered 2021-05-27: 10 mg via INTRAVENOUS
  Filled 2021-05-18: qty 1

## 2021-05-18 MED ORDER — SODIUM CHLORIDE 0.9% FLUSH
10.0000 mL | Freq: Two times a day (BID) | INTRAVENOUS | Status: DC
  Administered 2021-05-18 – 2021-05-31 (×20): 10 mL

## 2021-05-18 MED ORDER — SODIUM CHLORIDE 0.9 % IV SOLN: 1.0000 g | INTRAVENOUS | Status: DC

## 2021-05-18 MED ORDER — SODIUM CHLORIDE 0.9 % IV SOLN
500.0000 mg | Freq: Once | INTRAVENOUS | Status: AC
  Administered 2021-05-18: 500 mg via INTRAVENOUS
  Filled 2021-05-18: qty 5

## 2021-05-18 MED ORDER — SODIUM CHLORIDE 0.9 % IV SOLN: 500.0000 mg | INTRAVENOUS | Status: DC

## 2021-05-18 MED ORDER — ACETAMINOPHEN 325 MG PO TABS
325.0000 mg | ORAL_TABLET | ORAL | Status: DC | PRN
  Administered 2021-05-20 – 2021-05-25 (×8): 325 mg via ORAL
  Filled 2021-05-18 (×8): qty 1

## 2021-05-18 MED ORDER — IOHEXOL 300 MG/ML  SOLN
65.0000 mL | Freq: Once | INTRAMUSCULAR | Status: AC | PRN
  Administered 2021-05-19: 65 mL via INTRAVENOUS

## 2021-05-18 MED ORDER — MORPHINE SULFATE (PF) 4 MG/ML IV SOLN
4.0000 mg | Freq: Once | INTRAVENOUS | Status: AC
  Administered 2021-05-18: 4 mg via INTRAVENOUS
  Filled 2021-05-18: qty 1

## 2021-05-18 MED ORDER — OXYCODONE HCL 5 MG PO TABS
10.0000 mg | ORAL_TABLET | ORAL | Status: DC | PRN
  Administered 2021-05-18 – 2021-05-27 (×26): 10 mg via ORAL
  Filled 2021-05-18 (×26): qty 2

## 2021-05-18 MED ORDER — SODIUM CHLORIDE 0.9 % IV SOLN
10.0000 mL/h | Freq: Once | INTRAVENOUS | Status: AC
  Administered 2021-05-18: 10 mL/h via INTRAVENOUS

## 2021-05-18 MED ORDER — CHLORHEXIDINE GLUCONATE CLOTH 2 % EX PADS
6.0000 | MEDICATED_PAD | Freq: Every day | CUTANEOUS | Status: DC
  Administered 2021-05-18 – 2021-05-29 (×11): 6 via TOPICAL

## 2021-05-18 MED ORDER — ONDANSETRON HCL 4 MG PO TABS
4.0000 mg | ORAL_TABLET | Freq: Four times a day (QID) | ORAL | Status: DC | PRN
  Administered 2021-05-24: 4 mg via ORAL
  Filled 2021-05-18 (×2): qty 1

## 2021-05-18 MED ORDER — GABAPENTIN 300 MG PO CAPS
600.0000 mg | ORAL_CAPSULE | ORAL | Status: AC
  Administered 2021-05-18: 600 mg via ORAL
  Filled 2021-05-18: qty 2

## 2021-05-18 MED ORDER — PANTOPRAZOLE 80MG IVPB - SIMPLE MED
80.0000 mg | Freq: Once | INTRAVENOUS | Status: AC
Start: 2021-05-18 — End: 2021-05-18
  Administered 2021-05-18: 80 mg via INTRAVENOUS
  Filled 2021-05-18: qty 80

## 2021-05-18 MED ORDER — ZOLPIDEM TARTRATE 5 MG PO TABS
5.0000 mg | ORAL_TABLET | Freq: Every evening | ORAL | Status: DC | PRN
  Administered 2021-05-18 – 2021-05-29 (×6): 5 mg via ORAL
  Filled 2021-05-18 (×6): qty 1

## 2021-05-18 MED ORDER — ATORVASTATIN CALCIUM 20 MG PO TABS
20.0000 mg | ORAL_TABLET | Freq: Every day | ORAL | Status: DC
  Administered 2021-05-18 – 2021-05-28 (×11): 20 mg via ORAL
  Filled 2021-05-18 (×11): qty 1

## 2021-05-18 MED ORDER — ONDANSETRON HCL 4 MG/2ML IJ SOLN
4.0000 mg | Freq: Four times a day (QID) | INTRAMUSCULAR | Status: DC | PRN
  Administered 2021-05-21 – 2021-05-28 (×13): 4 mg via INTRAVENOUS
  Filled 2021-05-18 (×14): qty 2

## 2021-05-18 MED ORDER — CEFTRIAXONE SODIUM 1 G IJ SOLR
1.0000 g | Freq: Once | INTRAMUSCULAR | Status: AC
Start: 2021-05-18 — End: 2021-05-18
  Administered 2021-05-18: 1 g via INTRAVENOUS
  Filled 2021-05-18: qty 10

## 2021-05-18 MED ORDER — METHYLPREDNISOLONE ACETATE 80 MG/ML IJ SUSP: 80.0000 mg | Freq: Once | INTRAMUSCULAR | Status: AC

## 2021-05-18 MED ORDER — GABAPENTIN 600 MG PO TABS
600.0000 mg | ORAL_TABLET | Freq: Two times a day (BID) | ORAL | Status: DC
Start: 2021-05-18 — End: 2021-05-19

## 2021-05-18 MED ORDER — SODIUM CHLORIDE 0.9 % IV BOLUS
1000.0000 mL | Freq: Once | INTRAVENOUS | Status: AC
  Administered 2021-05-18: 1000 mL via INTRAVENOUS

## 2021-05-18 MED ORDER — ALBUTEROL SULFATE (2.5 MG/3ML) 0.083% IN NEBU: 2.5000 mg | INHALATION_SOLUTION | Freq: Four times a day (QID) | RESPIRATORY_TRACT | Status: DC | PRN

## 2021-05-18 MED ORDER — SODIUM CHLORIDE (PF) 0.9 % IJ SOLN
INTRAMUSCULAR | Status: AC
  Filled 2021-05-18: qty 50

## 2021-05-18 MED ORDER — OXYCODONE-ACETAMINOPHEN 10-325 MG PO TABS: 1.0000 | ORAL_TABLET | ORAL | Status: DC | PRN

## 2021-05-18 NOTE — ED Notes (Signed)
ED TO INPATIENT HANDOFF REPORT  ED Nurse Name and Phone #: Erick Colace, RN [phone]  S Name/Age/Gender [name] 76 y.o. female Room/Bed: WA19/WA19  Code Status   Code Status: Prior  Home/SNF/Other Home Patient oriented to: self, place, time, and situation Is this baseline? Yes   Triage Complete: Triage complete  Chief Complaint Symptomatic anemia [D64.9]  Triage Note Pt coming from Dr's office via EMS for dehydration. Pt also c/o weakness, black tarry stools, and poor PO intake for the last two weeks since getting home from hospital after a fall.    Allergies Allergies  Allergen Reactions   Dabigatran Etexilate Mesylate Other (See Comments)    INTERNAL BLEEDING- Pradaxa   Augmentin [Amoxicillin-Pot Clavulanate] Diarrhea and Nausea Only    Did it involve swelling of the face/tongue/throat, SOB, or low BP? No Did it involve sudden or severe rash/hives, skin peeling, or any reaction on the inside of your mouth or nose? No Did you need to seek medical attention at a hospital or doctor's office? No When did it last happen? 02/2019 If all above answers are "NO", may proceed with cephalosporin use.    Talwin [Pentazocine] Other (See Comments)    Hallucinations     Level of Care/Admitting Diagnosis ED Disposition     ED Disposition  Admit   Condition  --   Comment  Hospital Area: [place] [[id number]]  Level of Care: Telemetry [5]  Admit to tele based on following criteria: Complex arrhythmia (Bradycardia/Tachycardia)  May place patient in observation at [facility] or [place] if equivalent level of care is available:: No  Covid Evaluation: Confirmed COVID Negative  Diagnosis: Symptomatic anemia [[id number]]  Admitting Physician: Mariel Aloe [phone]  Attending Physician: Mariel Aloe [phone]          B Medical/Surgery History Past Medical History:  Diagnosis Date   Anemia    Arthritis    Atrial fibrillation ([place])    CAD  (coronary artery disease)    Mild disease per cath in 2011   COPD (chronic obstructive pulmonary disease) ([place])    Home O2   Diverticulitis    Elbow fracture, left aug 2012   Erythropoietin deficiency anemia 06/19/2018   Essential hypertension    Fibromyalgia    Gastroparesis    GERD (gastroesophageal reflux disease)    H/O: GI bleed    from Pradaxa   Hepatitis    Hx of in high school    History of pneumonia    Recurrent   Hx MRSA infection    Hx of gastric ulcer    Hx of stress fx 10/2009   Right hip    Iron malabsorption 08/14/2017   LBBB (left bundle branch block)    Oxygen dependent    2 liters via nasal cannula at all times   Pacemaker    CRT therapy; followed by Dr. Caryl Comes   Pleural effusion     s/p right thoracentesis 03/09   Primary dilated cardiomyopathy ([place])    EF 45 to 50% per echo in Jan 2012   RLS (restless legs syndrome)    Silent aspiration    Small bowel obstruction ([facility])    Urinary incontinence    Venous embolism and thrombosis of subclavian vein ([place])    After pacemaker insertion Oct 2011   Vitamin B12 deficiency    Wears glasses    Reading   Past Surgical History:  Procedure Laterality Date   APPENDECTOMY  BIV PACEMAKER GENERATOR CHANGEOUT N/A 06/19/2019   Procedure: BIV PACEMAKER GENERATOR CHANGEOUT;  Surgeon: Deboraha Sprang, MD;  Location: [facility];  Service: Cardiovascular;  Laterality: N/A;   BRONCHIAL WASHINGS  05/25/2019   Procedure: BRONCHIAL WASHINGS;  Surgeon: Chesley Mires, MD;  [facility];  Service: Endoscopy;;   CARDIAC CATHETERIZATION  04/08/2006, 11/16/2009   CHOLECYSTECTOMY N/A 06/27/2017   Procedure: OPEN CHOLECYSTECTOMY;  Surgeon: Ralene Ok, MD;  Location: [facility];  Service: General;  Laterality: N/A;   COLECTOMY     Intestinal resection (5times)   COLECTOMY  02/04/2000   ex. lap., intra-abd. subtotal colectomy with ileosigmoid colon anastomosies and lysis of adhesions   CYSTOSCOPY  11/11/2008   CYSTOSCOPY  W/ RETROGRADES  11/11/2008   right   CYSTOSCOPY WITH INJECTION  04/30/2006   transurethral collagen injection; incision vaginal stenosis   ERCP N/A 06/23/2017   Procedure: ENDOSCOPIC RETROGRADE CHOLANGIOPANCREATOGRAPHY (ERCP);  Surgeon: Carol Ada, MD;  Location: [facility];  Service: Endoscopy;  Laterality: N/A;   ERCP N/A 06/24/2017   Procedure: ENDOSCOPIC RETROGRADE CHOLANGIOPANCREATOGRAPHY (ERCP);  Surgeon: [name], MD;  Location: [facility];  Service: Endoscopy;  Laterality: N/A;   ESOPHAGOGASTRODUODENOSCOPY N/A 02/02/2013   Procedure: ESOPHAGOGASTRODUODENOSCOPY (EGD);  Surgeon: Lafayette Dragon, MD;  Location: [facility];  Service: Endoscopy;  Laterality: N/A;   EXPLORATORY LAPAROTOMY  04/27/2009   lysis of adhesions, gastrostomy tube   GASTROCUTANEOUS FISTULA CLOSURE     INTERSTIM IMPLANT PLACEMENT  05/28/2006 - stage I   06/05/2006 - stage II   INTERSTIM IMPLANT PLACEMENT  02/05/2012   Procedure: [name] IMPLANT FIRST STAGE;  Surgeon: Reece Packer, MD;  [facility];  Service: Urology;  Laterality: N/A;  Replacement of Interstim Lead      INTERSTIM IMPLANT REVISION  03/06/2011   Procedure: REVISION OF [name];  Surgeon: Reece Packer, MD;  [facility];  Service: Urology;  Laterality: N/A;  Replacement of Neurostimulator   INTERSTIM IMPLANT REVISION  10/23/2007   IR IMAGING GUIDED PORT INSERTION  10/14/2017   PACEMAKER PLACEMENT  12/21/2009   Peg removed     with complications   PORT-A-CATH REMOVAL  07/27/2011   Procedure: REMOVAL PORT-A-CATH;  Surgeon: Rolm Bookbinder, MD;  Location: [facility];  Service: General;  Laterality: N/A;   PORTACATH PLACEMENT  12/16/2009   SAVORY DILATION N/A 02/02/2013   Procedure: SAVORY DILATION;  Surgeon: Lafayette Dragon, MD;  [facility];  Service: Endoscopy;  Laterality: N/A;   SMALL INTESTINE SURGERY  05/20/2001   ex. lap., resection of small bowel stricture; gastrostomy; insertion central line    TEE WITHOUT CARDIOVERSION N/A 07/17/2019   Procedure: TRANSESOPHAGEAL ECHOCARDIOGRAM (TEE);  Surgeon: Donato Heinz, MD;  Location: [facility];  Service: Cardiovascular;  Laterality: N/A;   TONSILLECTOMY  age 74   TOTAL ABDOMINAL HYSTERECTOMY     complete   TOTAL HIP ARTHROPLASTY  08/03/2011   Procedure: TOTAL HIP ARTHROPLASTY ANTERIOR APPROACH;  Surgeon: Mcarthur Rossetti, MD;  [facility];  Service: Orthopedics;  Laterality: Right;   VIDEO BRONCHOSCOPY N/A 05/25/2019   Procedure: VIDEO BRONCHOSCOPY WITHOUT FLUORO;  Surgeon: Chesley Mires, MD;  [facility];  Service: Endoscopy;  Laterality: N/A;     A IV Location/Drains/Wounds Patient Lines/Drains/Airways Status     Active Line/Drains/Airways     Name Placement date Placement time Site Days   Implanted Port 10/14/17 Right Chest 10/14/17  1548  Chest  1312   Peripheral IV --  --  --  --  Incision (Closed) 07/20/17 Chest Other (Comment);Left;Anterior;Upper 07/20/17  0932  -- 1398            Intake/Output Last 24 hours No intake or output data in the 24 hours ending 05/22/2021 1911  Labs/Imaging Results for orders placed or performed during the hospital encounter of 06/13/2021 (from the past 48 hour(s))  POC occult blood, ED     Status: Abnormal   Collection Time: 06/08/2021  3:44 PM  Result Value Ref Range   Fecal Occult Bld POSITIVE (A) NEGATIVE  CBC with Differential/Platelet     Status: Abnormal (Preliminary result)   Collection Time: 06/08/2021  4:05 PM  Result Value Ref Range   WBC 10.8 (H) 4.0 - 10.5 K/uL   RBC 2.17 (L) 3.87 - 5.11 MIL/uL   Hemoglobin 6.0 (LL) 12.0 - 15.0 g/dL    Comment: REPEATED TO VERIFY THIS CRITICAL RESULT HAS VERIFIED AND BEEN CALLED TO D.BRATU, EMT BY NATHAN THOMPSON ON 03 02 2023 AT 1710, AND HAS BEEN READ BACK. CRITICAL RESULT VERIFIED    HCT 20.3 (L) 36.0 - 46.0 %   MCV 93.5 80.0 - 100.0 fL   MCH 27.6 26.0 - 34.0 pg   MCHC 29.6 (L) 30.0 - 36.0 g/dL   RDW 19.9 (H)  11.5 - 15.5 %   Platelets 308 150 - 400 K/uL   nRBC 0.8 (H) 0.0 - 0.2 %    Comment: Performed at [facility] [place] [address], [place], [place] [address]   Neutrophils Relative % PENDING %   Neutro Abs PENDING 1.7 - 7.7 K/uL   Band Neutrophils PENDING %   Lymphocytes Relative PENDING %   Lymphs Abs PENDING 0.7 - 4.0 K/uL   Monocytes Relative PENDING %   Monocytes Absolute PENDING 0.1 - 1.0 K/uL   Eosinophils Relative PENDING %   Eosinophils Absolute PENDING 0.0 - 0.5 K/uL   Basophils Relative PENDING %   Basophils Absolute PENDING 0.0 - 0.1 K/uL   WBC Morphology PENDING    RBC Morphology PENDING    Smear Review PENDING    Other PENDING %   nRBC PENDING 0 /100 WBC   Metamyelocytes Relative PENDING %   Myelocytes PENDING %   Promyelocytes Relative PENDING %   Blasts PENDING %   Immature Granulocytes PENDING %   Abs Immature Granulocytes PENDING 0.00 - 0.07 K/uL  Comprehensive metabolic panel     Status: Abnormal   Collection Time: 05/29/2021  4:05 PM  Result Value Ref Range   Sodium 138 135 - 145 mmol/L   Potassium 4.1 3.5 - 5.1 mmol/L   Chloride 97 (L) 98 - 111 mmol/L   CO2 29 22 - 32 mmol/L   Glucose, Bld 99 70 - 99 mg/dL    Comment: Glucose reference range applies only to samples taken after fasting for at least 8 hours.   BUN 30 (H) 8 - 23 mg/dL   Creatinine, Ser 1.03 (H) 0.44 - 1.00 mg/dL   Calcium 8.9 8.9 - 10.3 mg/dL   Total Protein 6.7 6.5 - 8.1 g/dL   Albumin 2.9 (L) 3.5 - 5.0 g/dL   AST 52 (H) 15 - 41 U/L   ALT 12 0 - 44 U/L   Alkaline Phosphatase 115 38 - 126 U/L   Total Bilirubin 1.2 0.3 - 1.2 mg/dL   GFR, Estimated 56 (L) >60 mL/min    Comment: (NOTE) Calculated using the CKD-EPI Creatinine Equation (2021)    Anion gap 12 5 - 15  Comment: Performed at [facility], [place] [address] [place], [place] [address]  Type and screen     Status: None (Preliminary result)   Collection Time: 06/05/2021  4:05 PM  Result Value Ref  Range   ABO/RH(D) A NEG    Antibody Screen NEG    Sample Expiration 05/21/2021,2359    Unit Number [id number]    Blood Component Type RED CELLS,LR    Unit division 00    Status of Unit ISSUED    Transfusion Status OK TO TRANSFUSE    Crossmatch Result      Compatible Performed at [facility] [address], [place], [place] [address]   Protime-INR     Status: Abnormal   Collection Time: 06/05/2021  4:05 PM  Result Value Ref Range   Prothrombin Time 16.7 (H) 11.4 - 15.2 seconds   INR 1.4 (H) 0.8 - 1.2    Comment: (NOTE) INR goal varies based on device and disease states. Performed at [facility], [place] [address], [place], [place] [address]   Blood gas, venous     Status: Abnormal   Collection Time: 05/30/2021  4:05 PM  Result Value Ref Range   pH, Ven 7.4 7.25 - 7.43   pCO2, Ven 49 44 - 60 mmHg   pO2, Ven 42 32 - 45 mmHg   Bicarbonate 30.4 (H) 20.0 - 28.0 mmol/L   Acid-Base Excess 5.1 (H) 0.0 - 2.0 mmol/L   O2 Saturation 73.2 %   Patient temperature 37.0     Comment: Performed at [facility], [place] [address] [place], [place] [address]  Resp Panel by RT-PCR (Flu A&B, Covid) Nasopharyngeal Swab     Status: None   Collection Time: 05/24/2021  4:05 PM   Specimen: Nasopharyngeal Swab; Nasopharyngeal(NP) swabs in vial transport medium  Result Value Ref Range   SARS Coronavirus 2 by RT PCR NEGATIVE NEGATIVE    Comment: (NOTE) SARS-CoV-2 target nucleic acids are NOT DETECTED.  The SARS-CoV-2 RNA is generally detectable in upper respiratory specimens during the acute phase of infection. The lowest concentration of SARS-CoV-2 viral copies this assay can detect is 138 copies/mL. A negative result does not preclude SARS-Cov-2 infection and should not be used as the sole basis for treatment or other patient management decisions. A negative result may occur with  improper specimen collection/handling, submission of specimen  other than nasopharyngeal swab, presence of viral mutation(s) within the areas targeted by this assay, and inadequate number of viral copies(<138 copies/mL). A negative result must be combined with clinical observations, patient history, and epidemiological information. The expected result is Negative.  Fact Sheet for Patients:  EntrepreneurPulse.com.au  Fact Sheet for Healthcare Providers:  IncredibleEmployment.be  This test is no t yet approved or cleared by the [place] FDA and  has been authorized for detection and/or diagnosis of SARS-CoV-2 by FDA under an Emergency Use Authorization (EUA). This EUA will remain  in effect (meaning this test can be used) for the duration of the COVID-19 declaration under Section 564(b)(1) of the Act, 21 U.S.C.section 360bbb-3(b)(1), unless the authorization is terminated  or revoked sooner.       Influenza A by PCR NEGATIVE NEGATIVE   Influenza B by PCR NEGATIVE NEGATIVE    Comment: (NOTE) The Xpert Xpress SARS-CoV-2/FLU/RSV plus assay is intended as an aid in the diagnosis of influenza from Nasopharyngeal swab specimens and should not be used as a sole basis for treatment. Nasal washings and aspirates  are unacceptable for Xpert Xpress SARS-CoV-2/FLU/RSV testing.  Fact Sheet for Patients: EntrepreneurPulse.com.au  Fact Sheet for Healthcare Providers: IncredibleEmployment.be  This test is not yet approved or cleared by the [place] FDA and has been authorized for detection and/or diagnosis of SARS-CoV-2 by FDA under an Emergency Use Authorization (EUA). This EUA will remain in effect (meaning this test can be used) for the duration of the COVID-19 declaration under Section 564(b)(1) of the Act, 21 U.S.C. section 360bbb-3(b)(1), unless the authorization is terminated or revoked.  Performed at [facility], [place] [address] [place], [place] [address]   Prepare RBC (crossmatch)     Status: None   Collection Time: 06/16/2021  5:13 PM  Result Value Ref Range   Order Confirmation      ORDER PROCESSED BY BLOOD BANK Performed at [facility] [address] [place], [place] [address]   Lactic acid, plasma     Status: None   Collection Time: 05/25/2021  5:27 PM  Result Value Ref Range   Lactic Acid, Venous 0.8 0.5 - 1.9 mmol/L    Comment: Performed at [facility], [place] [address], [place], [place] [address]   *Note: Due to a large number of results and/or encounters for the requested time period, some results have not been displayed. A complete set of results can be found in Results Review.   DG Chest 1 View  Result Date: 06/07/2021 CLINICAL DATA:  [place], dehydration EXAM: CHEST  1 VIEW COMPARISON:  04/26/2021 FINDINGS: Single frontal view of the chest demonstrates right chest wall port unchanged. Multi lead pacer is stable. Cardiac silhouette is unremarkable. Slight progression of the multifocal right-sided pneumonia seen previously. Trace right pleural effusion unchanged. Slight increased interstitial and ground-glass opacity within the left upper lobe. Right apical pleural thickening is unchanged since 04/10/2021, but has noticeably progressed since the 07/15/2020 CT and 02/03/2020 x-ray. No acute bony abnormalities. IMPRESSION: 1. Progressive multifocal bilateral airspace disease as above, consistent with worsening pneumonia. 2. Asymmetric right apical pleural thickening, which has progressed since the 11/05/2020 chest CT. 3. Given the persistent airspace disease and prominent right apical pleural thickening, follow-up chest CT with IV contrast may be useful to exclude underlying neoplasm. Electronically Signed   By: Randa Ngo M.D.   On: 05/28/2021 17:09   DG Lumbar Spine Complete  Result Date: 05/25/2021 CLINICAL DATA:  Dehydration, previous fall EXAM: LUMBAR SPINE - COMPLETE  4+ VIEW COMPARISON:  04/24/2021 FINDINGS: Negative for fracture. Moderate narrowing of the interspace L4-5 with stable grade 1 anterolisthesis. Implanted sacral stimulator device. Right hip arthroplasty components partially visualized. Cholecystectomy clips. Aortic Atherosclerosis (ICD10-170.0). IMPRESSION: No acute findings.  Stable grade 1 anterolisthesis L4-5. Electronically Signed   By: Lucrezia Europe M.D.   On: 06/08/2021 16:59   DG Pelvis 1-2 Views  Result Date: 06/05/2021 CLINICAL DATA:  [place], weakness EXAM: PELVIS - 1-2 VIEW COMPARISON:  08/03/2011 FINDINGS: 2 frontal views of the pelvis and bilateral hips are obtained. Right hip arthroplasty is identified, with no evidence of acute complication. Mild left hip osteoarthritis. No acute displaced fracture, subluxation, or dislocation. Abandoned fragmented stimulator wire overlies the right sacral ala. Nerve stimulator and contiguous lead within the left lower abdomen and pelvis. Soft tissues are unremarkable. IMPRESSION: 1. No acute displaced fracture. Electronically Signed   By: Randa Ngo M.D.   On: 05/25/2021 17:05   XR C-ARM NO REPORT  Result Date: 06/09/2021 Please see Notes tab for imaging impression.  Pending Labs Unresulted Labs (From admission, onward)     Start     Ordered   05/25/2021 1747  Respiratory (~20 pathogens) panel by PCR  (Respiratory panel by PCR (~20 pathogens, ~24 hr TAT)  w precautions)  Once,   STAT        06/13/2021 1746   05/22/2021 1727  Lactic acid, plasma  Now then every 2 hours,   STAT      05/22/2021 1726   05/27/2021 1727  Blood culture (routine x 2)  BLOOD CULTURE X 2,   STAT      06/02/2021 1726            Vitals/Pain Today's Vitals   05/30/2021 1716 05/19/2021 1800 06/13/2021 1830 06/15/2021 1845  BP:  (!) 141/71 (!) 147/68 (!) 143/85  Pulse:  (!) 112 (!) 113 (!) 115  Resp:  18 18 18   Temp:   98.2 F (36.8 C) 98.4 F (36.9 C)  TempSrc:   Oral Oral  SpO2:  100%  100%  Weight:      Height:      PainSc: 5         Isolation Precautions Droplet precaution  Medications Medications  pantoprozole (PROTONIX) 80 mg /NS 100 mL infusion (has no administration in time range)  cefTRIAXone (ROCEPHIN) 1 g in sodium chloride 0.9 % 100 mL IVPB (has no administration in time range)  azithromycin (ZITHROMAX) 500 mg in sodium chloride 0.9 % 250 mL IVPB (has no administration in time range)  morphine (PF) 4 MG/ML injection 4 mg (has no administration in time range)  oxyCODONE-acetaminophen (PERCOCET) 10-325 MG per tablet 1 tablet (has no administration in time range)  sodium chloride 0.9 % bolus 1,000 mL (0 mLs Intravenous Stopped 06/10/2021 1717)  morphine (PF) 4 MG/ML injection 4 mg (4 mg Intravenous Given 06/04/2021 1601)  0.9 %  sodium chloride infusion (10 mL/hr Intravenous New Bag/Given 06/14/2021 1836)  pantoprazole (PROTONIX) 80 mg /NS 100 mL IVPB (0 mg Intravenous Stopped 05/22/2021 1809)    Mobility walks with device Moderate fall risk   Focused Assessments    R Recommendations: See Admitting Provider Note  Report given to:   Additional Notes:

## 2021-05-18 NOTE — Assessment & Plan Note (Deleted)
See problem, Multifocal pneumonia ?

## 2021-05-18 NOTE — Assessment & Plan Note (Addendum)
Patient follows with hematology/oncology, Dr. Marin Olp. Baseline hemoglobin of about 10. ?-Hold Eliquis ?

## 2021-05-18 NOTE — Assessment & Plan Note (Addendum)
Hypotensive today -Hold oral Lasix - IV Lasix after transfusions  -Hold amlodipine

## 2021-05-18 NOTE — Assessment & Plan Note (Addendum)
-   Continue home oxygen at 4 L/min

## 2021-05-18 NOTE — Assessment & Plan Note (Deleted)
See problem, Chronic pain syndrome ?

## 2021-05-18 NOTE — ED Notes (Signed)
Attempted for [place] IV placement for 2nd line, unsuccessfully. Tried for regular IV, unsuccessful.  ?

## 2021-05-18 NOTE — ED Provider Notes (Signed)
[name] Provider Note   CSN: [id number] Arrival date & time: 05/31/2021  1432     History  Chief Complaint  Patient presents with   Dehydration   Weakness   Melena    [name] is a 76 y.o. female hx of HTN, afib on eliquis, here was ending with a fall and back pain and melena.  Patient states that she had a fall about a month ago.  She fell and injured her back.  She also states that she broke her right wrist and has a cast on.  Patient states that since then she was unable to walk.  She even developed a stage II sacral decub ulcer.  She states that over the last month, she has been having worsening back pain and melanotic stools.  Patient has a history of anemia requiring iron transfusions.  Patient is also still on Eliquis for A-fib.  Due to worsening weakness and persistent pain, patient came to the ED for evaluation.  Of note, patient had back injection today by orthopedic doctor.  Also patient is on 4 L nasal cannula at baseline for her chronic COPD  The history is provided by the patient.      Home Medications Prior to Admission medications   Medication Sig Start Date End Date Taking? Authorizing Provider  acetaminophen (TYLENOL) 650 MG CR tablet Take 650 mg by mouth every 8 (eight) hours as needed for pain.    [provider]  albuterol (VENTOLIN HFA) 108 (90 Base) MCG/ACT inhaler Inhale 2 puffs into the lungs every 6 (six) hours as needed for wheezing or shortness of breath.    [provider]  amLODipine (NORVASC) 2.5 MG tablet TAKE 1 TABLET BY MOUTH AT BEDTIME 03/29/21   Minus Breeding, MD  apixaban (ELIQUIS) 5 MG TABS tablet Take 1 tablet (5 mg total) by mouth 2 (two) times daily. 09/08/20   Roma Schanz R, DO  atorvastatin (LIPITOR) 20 MG tablet TAKE 1 TABLET BY MOUTH ONCE DAILY AT  [facility] 11/23/20   Carollee Herter, Yvonne R, DO  furosemide (LASIX) 40 MG tablet TAKE 1 TABLET BY MOUTH ONCE DAILY IN THE MORNING  02/14/21   Carollee Herter, Yvonne R, DO  gabapentin (NEURONTIN) 600 MG tablet TAKE 1 TABLET BY MOUTH ONCE DAILY IN THE MORNING, AND  AT  NOON,  AND  IN  THE  EVENING,  AND  AT  BEDTIME 04/24/21   Carollee Herter, Alferd Apa, DO  levofloxacin (LEVAQUIN) 500 MG tablet Take 1 tablet (500 mg total) by mouth daily. 04/28/21   Martyn Ehrich, NP  nitrofurantoin, macrocrystal-monohydrate, (MACROBID) 100 MG capsule Take 1 capsule (100 mg total) by mouth daily. 09/28/20   Carlyle Basques, MD  nitroGLYCERIN (NITROSTAT) 0.4 MG SL tablet Place 1 tablet (0.4 mg total) under the tongue every 5 (five) minutes as needed. CHEST PAIN 06/01/19   Minus Breeding, MD  oxyCODONE-acetaminophen (PERCOCET) 10-325 MG tablet Take 1 tablet by mouth every 4 (four) hours as needed. PAIN 05/17/21   Roma Schanz R, DO  OXYGEN Inhale 3 L/min into the lungs continuous.     [provider]  pantoprazole (PROTONIX) 40 MG tablet Take 1 tablet (40 mg total) by mouth 2 (two) times daily. 12/20/20   Gatha Mayer, MD  promethazine (PHENERGAN) 25 MG tablet Take 1 tablet (25 mg total) by mouth every 6 (six) hours as needed for nausea or vomiting. 12/20/20   Gatha Mayer, MD  tiZANidine (ZANAFLEX) 4 MG tablet Take 1 tablet (4 mg total) by mouth 3 (three) times daily as needed. for muscle spams 05/16/21   Carollee Herter, Alferd Apa, DO  triamcinolone ointment (KENALOG) 0.1 % Apply 1 application topically daily as needed (to affected areas- for psoriasis).  03/19/18   [provider]  umeclidinium bromide (INCRUSE ELLIPTA) 62.5 MCG/ACT AEPB Inhale 1 puff into the lungs daily. 03/03/21   Ann Held, DO  zolpidem (AMBIEN) 5 MG tablet TAKE 1 TABLET BY MOUTH EVERY DAY AT BEDTIME AS NEEDED 04/18/21   Ann Held, DO  iron dextran complex (INFED) 50 MG/ML injection Please give infed infusion (no test dose needed) over 4 hours per pharmacy calculated dose. Ht: 5'2 Wt: 99 pounds Hgb: 12 12/20/10 12/16/18  Lafayette Dragon, MD       Allergies    Dabigatran etexilate mesylate, Augmentin [amoxicillin-pot clavulanate], and Talwin [pentazocine]    Review of Systems   Review of Systems  Musculoskeletal:  Positive for back pain.  Neurological:  Positive for weakness.  All other systems reviewed and are negative.  Physical Exam Updated Vital Signs BP (!) 141/68    Pulse (!) 113    Temp 98.4 F (36.9 C) (Oral)    Resp 18    Ht 5' (1.524 m)    Wt 38.6 kg    SpO2 99%    BMI 16.60 kg/m  Physical Exam Vitals and nursing note reviewed.  Constitutional:      Appearance: Normal appearance.     Comments: Chronically ill, pale   HENT:     Head: Normocephalic.     Nose: Nose normal.     Mouth/Throat:     Mouth: Mucous membranes are dry.  Eyes:     Comments: Conjunctiva is pale  Cardiovascular:     Rate and Rhythm: Normal rate and regular rhythm.     Pulses: Normal pulses.     Heart sounds: Normal heart sounds.  Pulmonary:     Effort: Pulmonary effort is normal.     Comments: Diminished bilateral bases Abdominal:     General: Abdomen is flat.     Palpations: Abdomen is soft.  Genitourinary:    Comments: Patient has some dark stools but no obvious hemorrhoids Musculoskeletal:     Cervical back: Normal range of motion and neck supple.     Comments: Patient has mild diffuse lower lumbar tenderness.  Patient had injection site in the lower lumbar area that was done earlier today and there is no obvious erythema or drainage.  Patient does have a stage II sacral decub ulcer  Skin:    General: Skin is warm.  Neurological:     General: No focal deficit present.     Mental Status: She is alert and oriented to person, place, and time.  Psychiatric:        Mood and Affect: Mood normal.        Behavior: Behavior normal.    ED Results / Procedures / Treatments   Labs (all labs ordered are listed, but only abnormal results are displayed) Labs Reviewed  CBC WITH DIFFERENTIAL/PLATELET - Abnormal; Notable for the  following components:      Result Value   WBC 10.8 (*)    RBC 2.17 (*)    Hemoglobin 6.0 (*)    HCT 20.3 (*)    MCHC 29.6 (*)    RDW 19.9 (*)    nRBC 0.8 (*)    All other  components within normal limits  COMPREHENSIVE METABOLIC PANEL - Abnormal; Notable for the following components:   Chloride 97 (*)    BUN 30 (*)    Creatinine, Ser 1.03 (*)    Albumin 2.9 (*)    AST 52 (*)    GFR, Estimated 56 (*)    All other components within normal limits  PROTIME-INR - Abnormal; Notable for the following components:   Prothrombin Time 16.7 (*)    INR 1.4 (*)    All other components within normal limits  BLOOD GAS, VENOUS - Abnormal; Notable for the following components:   Bicarbonate 30.4 (*)    Acid-Base Excess 5.1 (*)    All other components within normal limits  POC OCCULT BLOOD, ED - Abnormal; Notable for the following components:   Fecal Occult Bld POSITIVE (*)    All other components within normal limits  RESP PANEL BY RT-PCR (FLU A&B, COVID) ARPGX2  CULTURE, BLOOD (ROUTINE X 2)  CULTURE, BLOOD (ROUTINE X 2)  RESPIRATORY PANEL BY PCR  LACTIC ACID, PLASMA  LACTIC ACID, PLASMA  TYPE AND SCREEN  PREPARE RBC (CROSSMATCH)    EKG None  Radiology DG Chest 1 View  Result Date: 06/16/2021 CLINICAL DATA:  [place], dehydration EXAM: CHEST  1 VIEW COMPARISON:  04/26/2021 FINDINGS: Single frontal view of the chest demonstrates right chest wall port unchanged. Multi lead pacer is stable. Cardiac silhouette is unremarkable. Slight progression of the multifocal right-sided pneumonia seen previously. Trace right pleural effusion unchanged. Slight increased interstitial and ground-glass opacity within the left upper lobe. Right apical pleural thickening is unchanged since 04/10/2021, but has noticeably progressed since the 07/15/2020 CT and 02/03/2020 x-ray. No acute bony abnormalities. IMPRESSION: 1. Progressive multifocal bilateral airspace disease as above, consistent with worsening pneumonia. 2.  Asymmetric right apical pleural thickening, which has progressed since the 11/05/2020 chest CT. 3. Given the persistent airspace disease and prominent right apical pleural thickening, follow-up chest CT with IV contrast may be useful to exclude underlying neoplasm. Electronically Signed   By: Randa Ngo M.D.   On: 06/14/2021 17:09   DG Lumbar Spine Complete  Result Date: 05/28/2021 CLINICAL DATA:  Dehydration, previous fall EXAM: LUMBAR SPINE - COMPLETE 4+ VIEW COMPARISON:  04/24/2021 FINDINGS: Negative for fracture. Moderate narrowing of the interspace L4-5 with stable grade 1 anterolisthesis. Implanted sacral stimulator device. Right hip arthroplasty components partially visualized. Cholecystectomy clips. Aortic Atherosclerosis (ICD10-170.0). IMPRESSION: No acute findings.  Stable grade 1 anterolisthesis L4-5. Electronically Signed   By: Lucrezia Europe M.D.   On: 05/31/2021 16:59   DG Pelvis 1-2 Views  Result Date: 05/17/2021 CLINICAL DATA:  [place], weakness EXAM: PELVIS - 1-2 VIEW COMPARISON:  08/03/2011 FINDINGS: 2 frontal views of the pelvis and bilateral hips are obtained. Right hip arthroplasty is identified, with no evidence of acute complication. Mild left hip osteoarthritis. No acute displaced fracture, subluxation, or dislocation. Abandoned fragmented stimulator wire overlies the right sacral ala. Nerve stimulator and contiguous lead within the left lower abdomen and pelvis. Soft tissues are unremarkable. IMPRESSION: 1. No acute displaced fracture. Electronically Signed   By: Randa Ngo M.D.   On: 06/10/2021 17:05   XR C-ARM NO REPORT  Result Date: 05/22/2021 Please see Notes tab for imaging impression.   Procedures Procedures    CRITICAL CARE Performed by: Wandra Arthurs   Total critical care time: 30 minutes  Critical care time was exclusive of separately billable procedures and treating other patients.  Critical care was necessary to treat  or prevent imminent or life-threatening  deterioration.  Critical care was time spent personally by me on the following activities: development of treatment plan with patient and/or surrogate as well as nursing, discussions with consultants, evaluation of patient's response to treatment, examination of patient, obtaining history from patient or surrogate, ordering and performing treatments and interventions, ordering and review of laboratory studies, ordering and review of radiographic studies, pulse oximetry and re-evaluation of patient's condition.   Medications Ordered in ED Medications  0.9 %  sodium chloride infusion (has no administration in time range)  pantoprazole (PROTONIX) 80 mg /NS 100 mL IVPB (has no administration in time range)  pantoprozole (PROTONIX) 80 mg /NS 100 mL infusion (has no administration in time range)  cefTRIAXone (ROCEPHIN) 1 g in sodium chloride 0.9 % 100 mL IVPB (has no administration in time range)  azithromycin (ZITHROMAX) 500 mg in sodium chloride 0.9 % 250 mL IVPB (has no administration in time range)  sodium chloride 0.9 % bolus 1,000 mL (0 mLs Intravenous Stopped 05/22/2021 1717)  morphine (PF) 4 MG/ML injection 4 mg (4 mg Intravenous Given 05/22/2021 1601)    ED Course/ Medical Decision Making/ A&P                           Medical Decision Making DEANDRA GOERING is a 76 y.o. female here with back pain and fall and melena.  Patient fell about a month ago now has sacral decub ulcers and also has melena.  Patient has a history of anemia requiring iron transfusions.  I am concerned that she may have ongoing upper GI bleed since she is on Eliquis.  Patient is tachycardic. I am also concerned that she may have a sacral fracture or lumbar fractures after the fall as she is not ambulatory after the fall.  Plan to get CBC and CMP and type and screen and PT/INR and lumbar x-rays and pelvis x-rays.  We will also give pain meds and reassess   5:47 PM Patient's hemoglobin is 6.  Patient is guaiac positive.  I  ordered Protonix IV bolus and drip.  I also ordered 2 units PRBC.  Moreover, patient has multifocal pneumonia as well.  Patient does not have a pelvic fracture or lumbar fracture.  I consulted Dr. Henrene Pastor from GI.  He states that he will see patient tomorrow.  Hospitalist to admit for pneumonia, upper GI bleed. Will hold eliquis.   Problems Addressed: Acute upper gastrointestinal bleeding: acute illness or injury Anemia, unspecified type: acute illness or injury Community acquired pneumonia of right lung, unspecified part of lung: acute illness or injury  Amount and/or Complexity of Data Reviewed Labs: ordered. Decision-making details documented in ED Course. Radiology: ordered and independent interpretation performed. Decision-making details documented in ED Course.  Risk Prescription drug management. Decision regarding hospitalization.  Final Clinical Impression(s) / ED Diagnoses Final diagnoses:  Acute upper gastrointestinal bleeding  Anemia, unspecified type  Community acquired pneumonia of right lung, unspecified part of lung    Rx / DC Orders ED Discharge Orders     None         Drenda Freeze, MD 05/31/2021 1749

## 2021-05-18 NOTE — Assessment & Plan Note (Addendum)
-  Hold home Ambien ?

## 2021-05-18 NOTE — ED Notes (Signed)
MD notified of pt's need for additional IV access.  ?

## 2021-05-18 NOTE — ED Triage Notes (Signed)
Pt coming from Dr's office via EMS for dehydration. Pt also c/o weakness, black tarry stools, and poor PO intake for the last two weeks since getting home from hospital after a fall.  ?

## 2021-05-18 NOTE — Progress Notes (Signed)
Pt state lower back pain that travels to her right leg. Pt state walking, standing and laying down makes the pain worse. Pt state she takes pain meds to help ease her pain. Pt has falling and has an bruise on her right thigh and pt would like to see if she could get an x-ray. ? ?Numeric Pain Rating Scale and Functional Assessment ?Average Pain 7 ? ? ?In the last MONTH (on 0-10 scale) has pain interfered with the following? ? ?1. General activity like being  able to carry out your everyday physical activities such as walking, climbing stairs, carrying groceries, or moving a chair?  ?Rating(10) ? ? ?+Driver, +BT pt has been off her Blood thinner for four days, -Dye Allergies. ? ?

## 2021-05-18 NOTE — H&P (Signed)
History and Physical    Patient: [name]:[id number] DOB: [birth year] DOA: 06/07/2021 DOS: the patient was seen and examined on 06/10/2021 PCP: Ann Held, DO  Patient coming from: Home  Chief Complaint:  Chief Complaint  Patient presents with   Dehydration   Weakness   Melena  Back pain   HPI: [name] is a 76 y.o. female with medical history significant of atrial fibrillation/flutter on Eliquis, . Paitent presented secondary worsening back pain for the last 2.5 weeks. She reports difficulty with standing with subsequent falls. She reports decreased eating and drinking secondary to lack of desire. She tried to eat and drink to help with symptoms but was not able to eat much. About one month prior, she fell about one month ago, breaking her wrist. With her falls, she has not had any syncope but does feel lightheaded/dizzy. She has also had melena for at least the past two weeks. No hematochezia noted. No nausea/vomiting. No palpitations, chest pain, or dyspnea. She reports a chronic cough with some production that is unchanged from baseline.  Review of Systems: As mentioned in the history of present illness. All other systems reviewed and are negative. Past Medical History:  Diagnosis Date   Anemia    Arthritis    Atrial fibrillation ([place])    CAD (coronary artery disease)    Mild disease per cath in 2011   COPD (chronic obstructive pulmonary disease) ([place])    Home O2   Diverticulitis    Elbow fracture, left aug 2012   Erythropoietin deficiency anemia 06/19/2018   Essential hypertension    Fibromyalgia    Gastroparesis    GERD (gastroesophageal reflux disease)    H/O: GI bleed    from Pradaxa   Hepatitis    Hx of in high school    History of pneumonia    Recurrent   Hx MRSA infection    Hx of gastric ulcer    Hx of stress fx 10/2009   Right hip    Iron malabsorption 08/14/2017   LBBB (left bundle branch block)    Oxygen dependent    2 liters via  nasal cannula at all times   Pacemaker    CRT therapy; followed by Dr. Caryl Comes   Pleural effusion     s/p right thoracentesis 03/09   Primary dilated cardiomyopathy ([place])    EF 45 to 50% per echo in Jan 2012   RLS (restless legs syndrome)    Silent aspiration    Small bowel obstruction (HCC)    Urinary incontinence    Venous embolism and thrombosis of subclavian vein ([place])    After pacemaker insertion Oct 2011   Vitamin B12 deficiency    Wears glasses    Reading   Past Surgical History:  Procedure Laterality Date   APPENDECTOMY     BIV PACEMAKER GENERATOR CHANGEOUT N/A 06/19/2019   Procedure: BIV PACEMAKER GENERATOR CHANGEOUT;  Surgeon: Deboraha Sprang, MD;  Location: [facility];  Service: Cardiovascular;  Laterality: N/A;   BRONCHIAL WASHINGS  05/25/2019   Procedure: BRONCHIAL WASHINGS;  Surgeon: Chesley Mires, MD;  Location: [facility];  Service: Endoscopy;;   CARDIAC CATHETERIZATION  04/08/2006, 11/16/2009   CHOLECYSTECTOMY N/A 06/27/2017   Procedure: OPEN CHOLECYSTECTOMY;  Surgeon: Ralene Ok, MD;  Location: [facility];  Service: General;  Laterality: N/A;   COLECTOMY     Intestinal resection (5times)   COLECTOMY  02/04/2000   ex. lap., intra-abd. subtotal colectomy with ileosigmoid  colon anastomosies and lysis of adhesions   CYSTOSCOPY  11/11/2008   CYSTOSCOPY W/ RETROGRADES  11/11/2008   right   CYSTOSCOPY WITH INJECTION  04/30/2006   transurethral collagen injection; incision vaginal stenosis   ERCP N/A 06/23/2017   Procedure: ENDOSCOPIC RETROGRADE CHOLANGIOPANCREATOGRAPHY (ERCP);  Surgeon: Carol Ada, MD;  Location: [facility];  Service: Endoscopy;  Laterality: N/A;   ERCP N/A 06/24/2017   Procedure: ENDOSCOPIC RETROGRADE CHOLANGIOPANCREATOGRAPHY (ERCP);  Surgeon: [name], MD;  [facility];  Service: Endoscopy;  Laterality: N/A;   ESOPHAGOGASTRODUODENOSCOPY N/A 02/02/2013   Procedure: ESOPHAGOGASTRODUODENOSCOPY (EGD);  Surgeon: Lafayette Dragon, MD;   Location: [facility];  Service: Endoscopy;  Laterality: N/A;   EXPLORATORY LAPAROTOMY  04/27/2009   lysis of adhesions, gastrostomy tube   GASTROCUTANEOUS FISTULA CLOSURE     INTERSTIM IMPLANT PLACEMENT  05/28/2006 - stage I   06/05/2006 - stage II   INTERSTIM IMPLANT PLACEMENT  02/05/2012   Procedure: [name] IMPLANT FIRST STAGE;  Surgeon: Reece Packer, MD;  Location: [facility];  Service: Urology;  Laterality: N/A;  Replacement of Interstim Lead      INTERSTIM IMPLANT REVISION  03/06/2011   Procedure: REVISION OF [name];  Surgeon: Reece Packer, MD;  Location: [facility];  Service: Urology;  Laterality: N/A;  Replacement of Neurostimulator   INTERSTIM IMPLANT REVISION  10/23/2007   IR IMAGING GUIDED PORT INSERTION  10/14/2017   PACEMAKER PLACEMENT  12/21/2009   Peg removed     with complications   PORT-A-CATH REMOVAL  07/27/2011   Procedure: REMOVAL PORT-A-CATH;  Surgeon: Rolm Bookbinder, MD;  Location: [facility];  Service: General;  Laterality: N/A;   PORTACATH PLACEMENT  12/16/2009   SAVORY DILATION N/A 02/02/2013   Procedure: SAVORY DILATION;  Surgeon: Lafayette Dragon, MD;  Location: [facility];  Service: Endoscopy;  Laterality: N/A;   SMALL INTESTINE SURGERY  05/20/2001   ex. lap., resection of small bowel stricture; gastrostomy; insertion central line   TEE WITHOUT CARDIOVERSION N/A 07/17/2019   Procedure: TRANSESOPHAGEAL ECHOCARDIOGRAM (TEE);  Surgeon: Donato Heinz, MD;  Location: [facility];  Service: Cardiovascular;  Laterality: N/A;   TONSILLECTOMY  age 40   TOTAL ABDOMINAL HYSTERECTOMY     complete   TOTAL HIP ARTHROPLASTY  08/03/2011   Procedure: TOTAL HIP ARTHROPLASTY ANTERIOR APPROACH;  Surgeon: Mcarthur Rossetti, MD;  Location: [facility];  Service: Orthopedics;  Laterality: Right;   VIDEO BRONCHOSCOPY N/A 05/25/2019   Procedure: VIDEO BRONCHOSCOPY WITHOUT FLUORO;  Surgeon: Chesley Mires, MD;  Location: [facility];  Service: Endoscopy;   Laterality: N/A;   Social History:  reports that she quit smoking about 35 years ago. Her smoking use included cigarettes. She has a 20.00 pack-year smoking history. She has never used smokeless tobacco. She reports that she does not drink alcohol and does not use drugs.  Allergies  Allergen Reactions   Dabigatran Etexilate Mesylate Other (See Comments)    INTERNAL BLEEDING- Pradaxa   Augmentin [Amoxicillin-Pot Clavulanate] Diarrhea and Nausea Only    Did it involve swelling of the face/tongue/throat, SOB, or low BP? No Did it involve sudden or severe rash/hives, skin peeling, or any reaction on the inside of your mouth or nose? No Did you need to seek medical attention at a hospital or doctor's office? No When did it last happen? 02/2019 If all above answers are "NO", may proceed with cephalosporin use.    Talwin [Pentazocine] Other (See Comments)    Hallucinations     Family History  Problem Relation Age of Onset   Heart disease Father    Stroke Mother    Heart disease Mother    Heart disease Brother    Breast cancer Maternal Aunt    Heart disease Paternal Aunt    Diabetes Paternal Grandfather    Colon cancer Neg Hx     Prior to Admission medications   Medication Sig Start Date End Date Taking? Authorizing Provider  acetaminophen (TYLENOL) 650 MG CR tablet Take 650 mg by mouth every 8 (eight) hours as needed for pain.    [provider]  albuterol (VENTOLIN HFA) 108 (90 Base) MCG/ACT inhaler Inhale 2 puffs into the lungs every 6 (six) hours as needed for wheezing or shortness of breath.    [provider]  amLODipine (NORVASC) 2.5 MG tablet TAKE 1 TABLET BY MOUTH AT BEDTIME 03/29/21   Minus Breeding, MD  apixaban (ELIQUIS) 5 MG TABS tablet Take 1 tablet (5 mg total) by mouth 2 (two) times daily. 09/08/20   Roma Schanz R, DO  atorvastatin (LIPITOR) 20 MG tablet TAKE 1 TABLET BY MOUTH ONCE DAILY AT  [facility] 11/23/20   Carollee Herter, Yvonne R, DO  furosemide  (LASIX) 40 MG tablet TAKE 1 TABLET BY MOUTH ONCE DAILY IN THE MORNING 02/14/21   Carollee Herter, Yvonne R, DO  gabapentin (NEURONTIN) 600 MG tablet TAKE 1 TABLET BY MOUTH ONCE DAILY IN THE MORNING, AND  AT  NOON,  AND  IN  THE  EVENING,  AND  AT  BEDTIME 04/24/21   Carollee Herter, Alferd Apa, DO  levofloxacin (LEVAQUIN) 500 MG tablet Take 1 tablet (500 mg total) by mouth daily. 04/28/21   Martyn Ehrich, NP  nitrofurantoin, macrocrystal-monohydrate, (MACROBID) 100 MG capsule Take 1 capsule (100 mg total) by mouth daily. 09/28/20   Carlyle Basques, MD  nitroGLYCERIN (NITROSTAT) 0.4 MG SL tablet Place 1 tablet (0.4 mg total) under the tongue every 5 (five) minutes as needed. CHEST PAIN 06/01/19   Minus Breeding, MD  oxyCODONE-acetaminophen (PERCOCET) 10-325 MG tablet Take 1 tablet by mouth every 4 (four) hours as needed. PAIN 05/17/21   Roma Schanz R, DO  OXYGEN Inhale 3 L/min into the lungs continuous.     [provider]  pantoprazole (PROTONIX) 40 MG tablet Take 1 tablet (40 mg total) by mouth 2 (two) times daily. 12/20/20   Gatha Mayer, MD  promethazine (PHENERGAN) 25 MG tablet Take 1 tablet (25 mg total) by mouth every 6 (six) hours as needed for nausea or vomiting. 12/20/20   Gatha Mayer, MD  tiZANidine (ZANAFLEX) 4 MG tablet Take 1 tablet (4 mg total) by mouth 3 (three) times daily as needed. for muscle spams 05/16/21   Carollee Herter, Alferd Apa, DO  triamcinolone ointment (KENALOG) 0.1 % Apply 1 application topically daily as needed (to affected areas- for psoriasis).  03/19/18   [provider]  umeclidinium bromide (INCRUSE ELLIPTA) 62.5 MCG/ACT AEPB Inhale 1 puff into the lungs daily. 03/03/21   Ann Held, DO  zolpidem (AMBIEN) 5 MG tablet TAKE 1 TABLET BY MOUTH EVERY DAY AT BEDTIME AS NEEDED 04/18/21   Ann Held, DO  iron dextran complex (INFED) 50 MG/ML injection Please give infed infusion (no test dose needed) over 4 hours per pharmacy calculated  dose. Ht: 5'2 Wt: 99 pounds Hgb: 12 12/20/10 12/16/18  Lafayette Dragon, MD    Physical Exam: Vitals:   06/09/2021 1440 05/19/2021 1513 05/17/2021 1600 06/15/2021 1800  BP: 137/68  (!) 141/68 (!) 141/71  Pulse: (!) 123  (!) 113 (!) 112  Resp: 18  18 18   Temp: 98.4 F (36.9 C)     TempSrc: Oral     SpO2: 94%  99% 100%  Weight: 38.6 kg 38.6 kg    Height: 5' (1.524 m) 5' (1.524 m)     General exam: Appears calm and comfortable and in no acute distress. Thi appearing. Conversant Respiratory: Rales bilaterally. Respiratory effort normal with no intercostal retractions or use of accessory muscles Cardiovascular: S1 & S2 heard, RRR. 2/6 systolic murmurs, rubs, gallops or clicks. No LE edema Gastrointestinal: Abdomen is non-distended, soft and non-tender. No masses felt. Normal bowel sounds heard Neurologic: No focal neurological deficits Musculoskeletal: No calf tenderness Skin: No cyanosis. No new rashes Psychiatry: Alert and oriented x3. Memory intact. Mood & affect appropriate  Data Reviewed:  BMP significant for a creatinine of 1.03 CBC significant for a WBC of 10,800, hemoglobin of 6.0 INR of 1.4 FOBT positive  Assessment and Plan: * Symptomatic anemia Secondary to GI bleeding as evidenced by melena and guaiac positive stool test. [facility] consulted on admission. Patient started empirically on Protonix IV drip. Hemoglobin of 6 on admission, down from baseline of about 10. 2 units of PRBC ordered by EDP. -Continue blood transfusions -Serial H&H/CBC -Follow-up GI recommendations for management -Clear liquid diet, NPO after midnight -Continue Protonix IV drip  Recurrent falls Unsure of etiology. Possibly related to anemia. Patient is also on gabapentin with low renal function. Back pain is also a barrier to successful mobility -PT/OT eval  Multifocal pneumonia Recurrent issues with pneumonia. Patient with a history of MAI not currently on treatment (completed treatment in 2022).  Last seen by ID in July 2022. Negative influenza/COVID. Started empirically on Ceftriaxone and azithromycin. Recently complete a 7 day treatment course of Levaquin for pneumonia. History of MAI and bronchiectasis makes this more complicated. Symptoms, per patient, are at baseline. -Check MRSA PCR -Continue Ceftriaxone/azithromycin for now -Check RVP -Check CT chest -Sputum culture, urine strep/legionella  Pulmonary MAI (mycobacterium avium-intracellulare) infection ([place]) See problem, Multifocal pneumonia  Hyperlipidemia LDL goal <100 -Continue Lipitor  Chronic diastolic CHF (congestive heart failure) (HCC) Stable. Patient is euvolemic. She is on Lasix as an outpatient. -Daily weights/strict in and out  Bronchiectasis ([place]) Chronic issue. Patient follows with pulmonology.  Insomnia -Continue Ambien qhs PRN  Anemia, chronic disease Patient follows with hematology/oncology, Dr. Marin Olp. Right port placed in upper chest. Patient receives intermittent blood transfusions. Baseline hemoglobin of about 10.  Chronic hypoxemic respiratory failure (HCC) -Continue home oxygen at 4 L/min  Fibromyalgia See problem, Chronic pain syndrome  COPD with emphysema (HCC) -Continue Incruse Ellipta and albuterol  Atrial fibrillation/flutter Currently seems to be in sinus rhythm with increased rate. Patient is on Eliquis for stroke prevention. She is not on rate or rhythm control medication. -Telemetry while having acute GI bleding  Essential hypertension Currently mostly normotensive. Patient is on amlodipine as an outpatient. -Hold amlodipine for now in setting of acute GI bleeding  Chronic pain syndrome -Continue home Percocet and gabapentin (decrease dose for renal function)     Advance Care Planning: Full code   Consults: [facility] (EDP)  Family Communication: Husband at bedside   Author: Cordelia Poche, MD 06/04/2021 6:30 PM  For on call review [email or web address].

## 2021-05-18 NOTE — Assessment & Plan Note (Addendum)
Unsure of etiology. Possibly related to anemia. Patient is also on gabapentin with low renal function. Back pain is also a barrier to successful mobility. PT recommending SNF. -Consulted TOC for SNF discharge

## 2021-05-18 NOTE — Assessment & Plan Note (Deleted)
Hypotension today ?- Transfuse blood and post transfusion Lasix IV ?- Hold amlodipine ?

## 2021-05-18 NOTE — Assessment & Plan Note (Addendum)
No active flare  - Continue Incruse Ellipta and albuterol

## 2021-05-18 NOTE — Assessment & Plan Note (Addendum)
Continue atorvastatin

## 2021-05-18 NOTE — Assessment & Plan Note (Addendum)
-   Continue home Percocet and gabapentin

## 2021-05-18 NOTE — Progress Notes (Signed)
[name] - 76 y.o. female MRN [id number]  Date of birth: 09-20-1945  Office Visit Note: Visit Date: 06/12/2021 PCP: Ann Held, DO Referred by: Ann Held, *  Subjective: Chief Complaint  Patient presents with   Lower Back - Pain   Right Leg - Pain   Left Leg - Pain   HPI: [name] is a 76 y.o. female who comes in todayFor planned L5-S1 interlaminar epidural steroid injection.  She has been off her anticoagulation for 4 days.  She is accompanied by her husband who provides most of the history.  The husband states when he comes in today that the patient has not been doing well status post fall.  He reports after the fall she was seen by Dr. Ninfa Linden and sustained a lower forearm fracture which is casted.  X-rays of the lumbar spine showed no fractures.  She had injection in the past that was scheduled for epidural injection.  Her case is complicated by severe COPD and hypoxic respiratory failure.  He reports over the last several days she has not really been eating or drinking very much.  She is arousable today but somnolent.  She appears dehydrated with skin tenting.  I can talk to her when I arouse her and she is consentable.  She does want to proceed with injection if it would help her back pain.  Patient's husband stated that he was going to take her to the emergency room today but wanted to get an injection done for her back first.  I did talk to him that that is probably somewhat problematic that if he felt like her condition was worsening to that point they should have just gone to the emergency room.  She reports not having much in the way of energy and basically not wanting to eat Sharol Given the severe pain she is having.  She does have a chronic pain history.  No prior back surgery.  They have not noted any shortness of breath other than her normal shortness of breath.  She has not had any change in her typical bowel or bladder behavior.     Review of  Systems  Constitutional:  Positive for malaise/fatigue.  Musculoskeletal:  Positive for back pain and joint pain.  Skin:        Tenting  Neurological:  Positive for weakness.  All other systems reviewed and are negative. Otherwise per HPI.  Assessment & Plan: Visit Diagnoses:    ICD-10-CM   1. Lumbar radiculopathy  M54.16 XR C-ARM NO REPORT    Epidural Steroid injection    methylPREDNISolone acetate (DEPO-MEDROL) injection 80 mg    2. Spinal stenosis of lumbar region with neurogenic claudication  M48.062 XR C-ARM NO REPORT    Epidural Steroid injection    methylPREDNISolone acetate (DEPO-MEDROL) injection 80 mg    3. Dehydration  E86.0     4. Failure to thrive in adult  R62.7     5. Pulmonary emphysema, unspecified emphysema type ([place])  J43.9     6. Skin ulcer of sacrum, unspecified ulcer stage ([place])  L98.429        Plan: Findings:  1.  Chronic severe low back pain some referral into the right hip but also left leg.  History of listhesis of L4 on L5 with severe stenosis.  CT myelogram 2020.  Patient with defibrillator and InterStim implant cannot have MRI.  Prior epidural injection beneficial with her pain historically.  Recent fall with  increased pain.  X-rays of lumbar spine does not show any compression fracture.  Patient has been off anticoagulant for proposed epidural injection.  See below for other issues today.  Case complicated by multiple severe medical issues.  Could be a sacral insufficiency fracture not evident on x-ray.  2.  Failure to thrive with chronic hypoxemic respiratory failure and dehydration.  We did call EMS for transportation over to the hospital for evaluation at the patient and her husband's request.  Again multiple medical issues status post fall.  3.  As of note during the epidural procedure I did see a sacral decubitus ulcer.  This was about the size of a quarter.  The patient's family did not know she had that ulcer and this is likely due to chronic  incontinence and immobility recently with sitting on the sacrum.  This will need to be addressed by at the ED visit and likely admission to the hospital.   Meds & Orders:  Meds ordered this encounter  Medications   methylPREDNISolone acetate (DEPO-MEDROL) injection 80 mg    Orders Placed This Encounter  Procedures   XR C-ARM NO REPORT   Epidural Steroid injection    Follow-up: Return for visit to requesting provider as needed.   Procedures: No procedures performed  Lumbar Epidural Steroid Injection - Interlaminar Approach with Fluoroscopic Guidance  Patient: [name]      Date of Birth: 10-05-1945 MRN: [id number] PCP: Ann Held, DO      Visit Date: 06/15/2021   Universal Protocol:     Consent Given By: the patient  Position: PRONE  Additional Comments: Vital signs were monitored before and after the procedure. Patient was prepped and draped in the usual sterile fashion. The correct patient, procedure, and site was verified.   Injection Procedure Details:   Procedure diagnoses: Lumbar radiculopathy [M54.16]   Meds Administered:  Meds ordered this encounter  Medications   methylPREDNISolone acetate (DEPO-MEDROL) injection 80 mg     Laterality: Right  Location/Site:  L5-S1  Needle: 3.5 in., 20 ga. Tuohy  Needle Placement: Paramedian epidural  Findings:   -Comments: Excellent flow of contrast into the epidural space.  Procedure Details: Using a paramedian approach from the side mentioned above, the region overlying the inferior lamina was localized under fluoroscopic visualization and the soft tissues overlying this structure were infiltrated with 4 ml. of 1% Lidocaine without Epinephrine. The Tuohy needle was inserted into the epidural space using a paramedian approach.   The epidural space was localized using loss of resistance along with counter oblique bi-planar fluoroscopic views.  After negative aspirate for air, blood, and CSF, a 2 ml.  volume of Isovue-250 was injected into the epidural space and the flow of contrast was observed. Radiographs were obtained for documentation purposes.    The injectate was administered into the level noted above.   Additional Comments:  The patient tolerated the procedure well Dressing: 2 x 2 sterile gauze and Band-Aid    Post-procedure details: Patient was observed during the procedure. Post-procedure instructions were reviewed.  Patient left the clinic in stable condition.    Clinical History: CT LUMBAR MYELOGRAM FINDINGS:   Segmentation: Standard.   Alignment: Unchanged 7 mm anterolisthesis at L4-L5.   Vertebrae: No acute fracture or other focal pathologic process.   Conus medullaris and cauda equina: Conus extends to the L1-L2 level. Conus and cauda equina appear normal.   Paraspinal and other soft tissues: Scarring at the right lung base. Mild central  intrahepatic and common bile duct dilatation is likely due to post cholecystectomy state. Aortoiliac atherosclerotic vascular disease.   Disc levels:   T11-T12: Negative.   T12-L1:  Small right paracentral disc protrusion.  No stenosis.   L1-L2:  Small right paracentral disc protrusion.  No stenosis.   L2-L3:  Negative disc.  Ligamentum flavum hypertrophy.  No stenosis.   L3-L4:  Negative disc.  Ligamentum flavum hypertrophy.  No stenosis.   L4-L5: Disc uncovering with diffuse disc bulging. Moderate bilateral facet arthropathy with prominent ligamentum flavum hypertrophy. Moderate spinal canal stenosis. Severe left and mild right lateral recess stenosis. Mild to moderate left and mild right neuroforaminal stenosis.   L5-S1: Small shallow broad-based posterior disc protrusion. Mild bilateral facet arthropathy. No stenosis.   IMPRESSION: 1. 10 mm anterolisthesis at L4-L5 when prone, not significantly changed with dynamic maneuvers, but increased from 7 mm when supine. 2. Severe spinal canal stenosis at L4-L5  when prone, moderate when standing or supine. 3. Severe left lateral recess stenosis at L4-L5 with impingement of the descending left L4 nerve root.     Electronically Signed   By: Titus Dubin M.D.   On: 09/17/2018 11:25   She reports that she quit smoking about 35 years ago. Her smoking use included cigarettes. She has a 20.00 pack-year smoking history. She has never used smokeless tobacco. No results for input(s): HGBA1C, LABURIC in the last 8760 hours.  Objective:  VS:  HT:     WT:    BMI:      BP:114/71   HR:(!) 125bpm   TEMP: ( )   RESP:  Physical Exam Vitals and nursing note reviewed.  Constitutional:      General: She is not in acute distress.    Appearance: Normal appearance. She is ill-appearing.  HENT:     Head: Normocephalic and atraumatic.     Right Ear: External ear normal.     Left Ear: External ear normal.     Nose:     Comments: Nasal cannula oxygen Eyes:     Extraocular Movements: Extraocular movements intact.  Cardiovascular:     Rate and Rhythm: Normal rate.     Pulses: Normal pulses.  Pulmonary:     Effort: Pulmonary effort is normal. No respiratory distress.  Abdominal:     General: There is no distension.     Palpations: Abdomen is soft.  Musculoskeletal:        General: Tenderness present.     Cervical back: Neck supple.     Right lower leg: No edema.     Left lower leg: No edema.     Comments: Patient has good distal strength with no pain over the greater trochanters.  No clonus or focal weakness.  Skin:    Findings: No erythema, lesion or rash.  Neurological:     General: No focal deficit present.     Sensory: No sensory deficit.     Motor: No weakness or abnormal muscle tone.     Coordination: Coordination normal.     Gait: Gait abnormal.     Comments: Patient's somnolent but arousable.  She can answer and talk to me when she is rales but will immediately close her eyes without talking with her.  Psychiatric:     Comments: Somnolent but  arousable patient did understand issues today in terms of emergency room evaluation and epidural injection.    Ortho Exam  Imaging: No results found.  Past Medical/Family/Surgical/Social History: Medications & Allergies reviewed  per EMR, new medications updated. Patient Active Problem List   Diagnosis Date Noted   Pressure injury of skin 05/19/2021   Right upper lobe consolidation ([place]) 05/19/2021   Acute on chronic respiratory failure with hypoxemia (HCC) 05/19/2021   Community acquired pneumonia of right lung    Symptomatic anemia 05/29/2021   Multifocal pneumonia 06/10/2021   Recurrent falls 05/17/2021   Leukocytosis 05/02/2021   Hematoma 08/07/2019   Other neutropenia ([place])    Post-procedural fever 07/20/2019   Acute febrile illness 07/16/2019   Pulmonary MAI (mycobacterium avium-intracellulare) infection ([place]) 06/24/2019   History of total right hip replacement 05/27/2019   NICM (nonischemic cardiomyopathy) ([place]) 05/19/2019   Educated about COVID-19 virus infection 09/07/2018   Erythropoietin deficiency anemia 06/19/2018   Swelling of calf 02/04/2018   Bad odor of urine 12/09/2017   Iron deficiency anemia due to chronic blood loss 08/14/2017   Iron malabsorption 08/14/2017   Hyperlipidemia LDL goal <100 07/08/2017   Elevated ALT measurement    Hypotension 06/20/2017   HLD (hyperlipidemia) 06/20/2017   CAD (coronary artery disease) 06/20/2017   Protein-calorie malnutrition, severe ([place]) 06/20/2017   Generalized weakness    Weakness 05/30/2017   Focal neurological deficit 05/30/2017   Chronic diastolic CHF (congestive heart failure) ([place]) 05/30/2017   TIA (transient ischemic attack) 05/30/2017   AKI (acute kidney injury) ([place]) 05/30/2017   Abnormal LFTs 05/30/2017   Bronchiectasis ([place]) 04/22/2017   Right otitis media 07/17/2016   Cerumen impaction 07/17/2016   Osteoporosis 03/16/2016   Headache 11/20/2014   Skin infection 05/27/2014   Insomnia 05/27/2014    Abdominal wall pain 07/09/2013   Other dysphagia 02/02/2013   Platelets decreased ([place]) 01/29/2012   Vitamin D deficiency 12/13/2011   Hypokalemia 12/13/2011   Hypocalcemia 12/12/2011   Adhesions of intestine, very dense, with frozen abdomen 12/11/2011   SBO (small bowel obstruction), recurrent 12/11/2011   Protein-calorie malnutrition, moderate ([place]) 12/11/2011   Oxygen dependent    Hx of gastric ulcer    UTI (urinary tract infection) 10/15/2011   Allergic conjunctivitis 10/15/2011   Degenerative arthritis of hip 08/03/2011   B12 deficiency 07/02/2011   Bilateral leg weakness 01/16/2011   Anemia, chronic disease 09/14/2010   Recurrent pneumonia 08/11/2010   CRT- [employer] 06/26/2010   DYSURIA 05/19/2010   Chronic hypoxemic respiratory failure ([place]) 02/21/2010   DEHYDRATION 01/06/2010   GASTROENTERITIS 01/06/2010   Congestive dilated cardiomyopathy ([place]) 11/17/2009   LBBB 16/60/6301   SYSTOLIC HEART FAILURE, CHRONIC 11/17/2009   GERD (gastroesophageal reflux disease) 02/17/2009   NONSPECIFIC ABN FINDNG RAD&OTH EXAM BILARY TRCT 09/03/2008   COPD with emphysema ([place]) 06/28/2008   DIVERTICULOSIS, COLON 06/03/2008   GASTRITIS 06/02/2008   Atrial fibrillation/flutter 12/08/2007   GASTROPARESIS 08/07/2007   NAUSEA, CHRONIC 08/07/2007   Abdominal pain 08/07/2007   SYNCOPE 02/17/2007   RESTLESS LEG SYNDROME 08/01/2006   Chronic pain syndrome 08/01/2006   OSTEOARTHRITIS 07/31/2006   Degenerative disc disease, lumbar 07/31/2006   Fibromyalgia 07/31/2006   CHRONIC FATIGUE SYNDROME 07/31/2006   URINARY INCONTINENCE 07/31/2006   ANEMIA-IRON DEFICIENCY 05/01/2006   Essential hypertension 05/01/2006   Past Medical History:  Diagnosis Date   Anemia    Arthritis    Atrial fibrillation ([place])    CAD (coronary artery disease)    Mild disease per cath in 2011   COPD (chronic obstructive pulmonary disease) ([place])    Home O2   Diverticulitis    Elbow fracture, left aug 2012    Erythropoietin deficiency anemia 06/19/2018   Essential  hypertension    Fibromyalgia    Gastroparesis    GERD (gastroesophageal reflux disease)    H/O: GI bleed    from Pradaxa   Hepatitis    Hx of in high school    History of pneumonia    Recurrent   Hx MRSA infection    Hx of gastric ulcer    Hx of stress fx 10/2009   Right hip    Iron malabsorption 08/14/2017   LBBB (left bundle branch block)    Oxygen dependent    2 liters via nasal cannula at all times   Pacemaker    CRT therapy; followed by Dr. Caryl Comes   Pleural effusion     s/p right thoracentesis 03/09   Primary dilated cardiomyopathy ([place])    EF 45 to 50% per echo in Jan 2012   RLS (restless legs syndrome)    Silent aspiration    Small bowel obstruction (HCC)    Urinary incontinence    Venous embolism and thrombosis of subclavian vein ([place])    After pacemaker insertion Oct 2011   Vitamin B12 deficiency    Wears glasses    Reading   Family History  Problem Relation Age of Onset   Heart disease Father    Stroke Mother    Heart disease Mother    Heart disease Brother    Breast cancer Maternal Aunt    Heart disease Paternal Aunt    Diabetes Paternal Grandfather    Colon cancer Neg Hx    Past Surgical History:  Procedure Laterality Date   APPENDECTOMY     BIV PACEMAKER GENERATOR CHANGEOUT N/A 06/19/2019   Procedure: BIV PACEMAKER GENERATOR CHANGEOUT;  Surgeon: Deboraha Sprang, MD;  Location: [facility];  Service: Cardiovascular;  Laterality: N/A;   BRONCHIAL WASHINGS  05/25/2019   Procedure: BRONCHIAL WASHINGS;  Surgeon: Chesley Mires, MD;  Location: [facility];  Service: Endoscopy;;   CARDIAC CATHETERIZATION  04/08/2006, 11/16/2009   CHOLECYSTECTOMY N/A 06/27/2017   Procedure: OPEN CHOLECYSTECTOMY;  Surgeon: Ralene Ok, MD;  Location: [facility];  Service: General;  Laterality: N/A;   COLECTOMY     Intestinal resection (5times)   COLECTOMY  02/04/2000   ex. lap., intra-abd. subtotal colectomy with  ileosigmoid colon anastomosies and lysis of adhesions   CYSTOSCOPY  11/11/2008   CYSTOSCOPY W/ RETROGRADES  11/11/2008   right   CYSTOSCOPY WITH INJECTION  04/30/2006   transurethral collagen injection; incision vaginal stenosis   ERCP N/A 06/23/2017   Procedure: ENDOSCOPIC RETROGRADE CHOLANGIOPANCREATOGRAPHY (ERCP);  Surgeon: Carol Ada, MD;  Location: [facility];  Service: Endoscopy;  Laterality: N/A;   ERCP N/A 06/24/2017   Procedure: ENDOSCOPIC RETROGRADE CHOLANGIOPANCREATOGRAPHY (ERCP);  Surgeon: [name], MD;  Location: [facility];  Service: Endoscopy;  Laterality: N/A;   ESOPHAGOGASTRODUODENOSCOPY N/A 02/02/2013   Procedure: ESOPHAGOGASTRODUODENOSCOPY (EGD);  Surgeon: Lafayette Dragon, MD;  Location: [facility];  Service: Endoscopy;  Laterality: N/A;   ESOPHAGOGASTRODUODENOSCOPY (EGD) WITH PROPOFOL N/A 05/17/2021   Procedure: ESOPHAGOGASTRODUODENOSCOPY (EGD) WITH PROPOFOL;  Surgeon: Juanita Craver, MD;  Location: [facility];  Service: Gastroenterology;  Laterality: N/A;   EXPLORATORY LAPAROTOMY  04/27/2009   lysis of adhesions, gastrostomy tube   GASTROCUTANEOUS FISTULA CLOSURE     INTERSTIM IMPLANT PLACEMENT  05/28/2006 - stage I   06/05/2006 - stage II   INTERSTIM IMPLANT PLACEMENT  02/05/2012   Procedure: [name] IMPLANT FIRST STAGE;  Surgeon: Reece Packer, MD;  Location: [facility];  Service: Urology;  Laterality: N/A;  Replacement  of Interstim Lead      INTERSTIM IMPLANT REVISION  03/06/2011   Procedure: REVISION OF [name];  Surgeon: Reece Packer, MD;  Location: [facility];  Service: Urology;  Laterality: N/A;  Replacement of Neurostimulator   INTERSTIM IMPLANT REVISION  10/23/2007   IR IMAGING GUIDED PORT INSERTION  10/14/2017   PACEMAKER PLACEMENT  12/21/2009   Peg removed     with complications   PORT-A-CATH REMOVAL  07/27/2011   Procedure: REMOVAL PORT-A-CATH;  Surgeon: Rolm Bookbinder, MD;  Location: [name];  Service: General;  Laterality: N/A;    PORTACATH PLACEMENT  12/16/2009   SAVORY DILATION N/A 02/02/2013   Procedure: SAVORY DILATION;  Surgeon: Lafayette Dragon, MD;  [facility];  Service: Endoscopy;  Laterality: N/A;   SMALL INTESTINE SURGERY  05/20/2001   ex. lap., resection of small bowel stricture; gastrostomy; insertion central line   TEE WITHOUT CARDIOVERSION N/A 07/17/2019   Procedure: TRANSESOPHAGEAL ECHOCARDIOGRAM (TEE);  Surgeon: Donato Heinz, MD;  Location: [facility]COPY;  Service: Cardiovascular;  Laterality: N/A;   TONSILLECTOMY  age 41   TOTAL ABDOMINAL HYSTERECTOMY     complete   TOTAL HIP ARTHROPLASTY  08/03/2011   Procedure: TOTAL HIP ARTHROPLASTY ANTERIOR APPROACH;  Surgeon: Mcarthur Rossetti, MD;  Location: [facility];  Service: Orthopedics;  Laterality: Right;   VIDEO BRONCHOSCOPY N/A 05/25/2019   Procedure: VIDEO BRONCHOSCOPY WITHOUT FLUORO;  Surgeon: Chesley Mires, MD;  Location: [facility];  Service: Endoscopy;  Laterality: N/A;   Social History   Occupational History   Occupation: disabled    Employer: RETIRED  Tobacco Use   Smoking status: Former    Packs/day: 1.00    Years: 20.00    Pack years: 20.00    Types: Cigarettes    Quit date: 03/19/1986    Years since quitting: 35.2   Smokeless tobacco: Never  Vaping Use   Vaping Use: Never used  Substance and Sexual Activity   Alcohol use: No    Alcohol/week: 0.0 standard drinks   Drug use: No   Sexual activity: Yes

## 2021-05-18 NOTE — Assessment & Plan Note (Addendum)
See prior summary.  Heme transfusing again today, we will reduce to 1 unit. - Continue Protonix  - IV Iron

## 2021-05-18 NOTE — Assessment & Plan Note (Addendum)
Rate controlled Given recurrent anemia and unclear prognosis, I favor holding Elqiuis for a short time more -Hold Eliquis

## 2021-05-18 NOTE — Assessment & Plan Note (Addendum)
See summary from 3/10 Today more tachycardic, clinically worse - Broaden to vancomycin, cefepime, Flagyl - Check lactate, procal, troponin - Obtain cultures of blood

## 2021-05-19 DIAGNOSIS — R64 Cachexia: Secondary | ICD-10-CM | POA: Diagnosis present

## 2021-05-19 DIAGNOSIS — E860 Dehydration: Secondary | ICD-10-CM | POA: Diagnosis not present

## 2021-05-19 DIAGNOSIS — R531 Weakness: Secondary | ICD-10-CM | POA: Diagnosis not present

## 2021-05-19 DIAGNOSIS — B59 Pneumocystosis: Secondary | ICD-10-CM | POA: Diagnosis not present

## 2021-05-19 DIAGNOSIS — A310 Pulmonary mycobacterial infection: Secondary | ICD-10-CM | POA: Diagnosis not present

## 2021-05-19 DIAGNOSIS — R778 Other specified abnormalities of plasma proteins: Secondary | ICD-10-CM | POA: Diagnosis not present

## 2021-05-19 DIAGNOSIS — I4892 Unspecified atrial flutter: Secondary | ICD-10-CM | POA: Diagnosis present

## 2021-05-19 DIAGNOSIS — L8990 Pressure ulcer of unspecified site, unspecified stage: Secondary | ICD-10-CM | POA: Diagnosis present

## 2021-05-19 DIAGNOSIS — I509 Heart failure, unspecified: Secondary | ICD-10-CM | POA: Diagnosis not present

## 2021-05-19 DIAGNOSIS — J9620 Acute and chronic respiratory failure, unspecified whether with hypoxia or hypercapnia: Secondary | ICD-10-CM | POA: Diagnosis not present

## 2021-05-19 DIAGNOSIS — I110 Hypertensive heart disease with heart failure: Secondary | ICD-10-CM | POA: Diagnosis not present

## 2021-05-19 DIAGNOSIS — C3411 Malignant neoplasm of upper lobe, right bronchus or lung: Secondary | ICD-10-CM | POA: Diagnosis present

## 2021-05-19 DIAGNOSIS — D509 Iron deficiency anemia, unspecified: Secondary | ICD-10-CM | POA: Diagnosis not present

## 2021-05-19 DIAGNOSIS — C7951 Secondary malignant neoplasm of bone: Secondary | ICD-10-CM | POA: Diagnosis present

## 2021-05-19 DIAGNOSIS — J90 Pleural effusion, not elsewhere classified: Secondary | ICD-10-CM | POA: Diagnosis not present

## 2021-05-19 DIAGNOSIS — Z66 Do not resuscitate: Secondary | ICD-10-CM | POA: Diagnosis not present

## 2021-05-19 DIAGNOSIS — I5A Non-ischemic myocardial injury (non-traumatic): Secondary | ICD-10-CM | POA: Diagnosis not present

## 2021-05-19 DIAGNOSIS — J470 Bronchiectasis with acute lower respiratory infection: Secondary | ICD-10-CM

## 2021-05-19 DIAGNOSIS — J439 Emphysema, unspecified: Secondary | ICD-10-CM

## 2021-05-19 DIAGNOSIS — C187 Malignant neoplasm of sigmoid colon: Secondary | ICD-10-CM | POA: Diagnosis not present

## 2021-05-19 DIAGNOSIS — J189 Pneumonia, unspecified organism: Secondary | ICD-10-CM | POA: Insufficient documentation

## 2021-05-19 DIAGNOSIS — I5032 Chronic diastolic (congestive) heart failure: Secondary | ICD-10-CM | POA: Diagnosis not present

## 2021-05-19 DIAGNOSIS — Z7189 Other specified counseling: Secondary | ICD-10-CM | POA: Diagnosis not present

## 2021-05-19 DIAGNOSIS — L89150 Pressure ulcer of sacral region, unstageable: Secondary | ICD-10-CM | POA: Diagnosis present

## 2021-05-19 DIAGNOSIS — C188 Malignant neoplasm of overlapping sites of colon: Secondary | ICD-10-CM | POA: Diagnosis present

## 2021-05-19 DIAGNOSIS — R0602 Shortness of breath: Secondary | ICD-10-CM | POA: Diagnosis not present

## 2021-05-19 DIAGNOSIS — I482 Chronic atrial fibrillation, unspecified: Secondary | ICD-10-CM | POA: Diagnosis present

## 2021-05-19 DIAGNOSIS — G2581 Restless legs syndrome: Secondary | ICD-10-CM | POA: Diagnosis present

## 2021-05-19 DIAGNOSIS — D649 Anemia, unspecified: Secondary | ICD-10-CM | POA: Diagnosis not present

## 2021-05-19 DIAGNOSIS — K921 Melena: Secondary | ICD-10-CM | POA: Diagnosis not present

## 2021-05-19 DIAGNOSIS — D638 Anemia in other chronic diseases classified elsewhere: Secondary | ICD-10-CM | POA: Diagnosis not present

## 2021-05-19 DIAGNOSIS — C771 Secondary and unspecified malignant neoplasm of intrathoracic lymph nodes: Secondary | ICD-10-CM | POA: Diagnosis present

## 2021-05-19 DIAGNOSIS — I480 Paroxysmal atrial fibrillation: Secondary | ICD-10-CM | POA: Diagnosis present

## 2021-05-19 DIAGNOSIS — J9611 Chronic respiratory failure with hypoxia: Secondary | ICD-10-CM | POA: Diagnosis not present

## 2021-05-19 DIAGNOSIS — Z20822 Contact with and (suspected) exposure to covid-19: Secondary | ICD-10-CM | POA: Diagnosis present

## 2021-05-19 DIAGNOSIS — C3490 Malignant neoplasm of unspecified part of unspecified bronchus or lung: Secondary | ICD-10-CM | POA: Diagnosis not present

## 2021-05-19 DIAGNOSIS — I420 Dilated cardiomyopathy: Secondary | ICD-10-CM | POA: Diagnosis present

## 2021-05-19 DIAGNOSIS — J479 Bronchiectasis, uncomplicated: Secondary | ICD-10-CM | POA: Diagnosis not present

## 2021-05-19 DIAGNOSIS — C3491 Malignant neoplasm of unspecified part of right bronchus or lung: Secondary | ICD-10-CM | POA: Diagnosis not present

## 2021-05-19 DIAGNOSIS — J181 Lobar pneumonia, unspecified organism: Secondary | ICD-10-CM

## 2021-05-19 DIAGNOSIS — Z980 Intestinal bypass and anastomosis status: Secondary | ICD-10-CM | POA: Diagnosis not present

## 2021-05-19 DIAGNOSIS — R590 Localized enlarged lymph nodes: Secondary | ICD-10-CM | POA: Diagnosis not present

## 2021-05-19 DIAGNOSIS — E440 Moderate protein-calorie malnutrition: Secondary | ICD-10-CM | POA: Diagnosis present

## 2021-05-19 DIAGNOSIS — I4891 Unspecified atrial fibrillation: Secondary | ICD-10-CM | POA: Diagnosis not present

## 2021-05-19 DIAGNOSIS — J9621 Acute and chronic respiratory failure with hypoxia: Secondary | ICD-10-CM

## 2021-05-19 DIAGNOSIS — C189 Malignant neoplasm of colon, unspecified: Secondary | ICD-10-CM | POA: Diagnosis not present

## 2021-05-19 DIAGNOSIS — J9601 Acute respiratory failure with hypoxia: Secondary | ICD-10-CM | POA: Diagnosis not present

## 2021-05-19 DIAGNOSIS — R918 Other nonspecific abnormal finding of lung field: Secondary | ICD-10-CM | POA: Diagnosis not present

## 2021-05-19 DIAGNOSIS — Z681 Body mass index (BMI) 19 or less, adult: Secondary | ICD-10-CM | POA: Diagnosis not present

## 2021-05-19 DIAGNOSIS — I10 Essential (primary) hypertension: Secondary | ICD-10-CM

## 2021-05-19 DIAGNOSIS — D62 Acute posthemorrhagic anemia: Secondary | ICD-10-CM | POA: Diagnosis present

## 2021-05-19 DIAGNOSIS — I2510 Atherosclerotic heart disease of native coronary artery without angina pectoris: Secondary | ICD-10-CM | POA: Diagnosis not present

## 2021-05-19 DIAGNOSIS — J869 Pyothorax without fistula: Secondary | ICD-10-CM | POA: Diagnosis not present

## 2021-05-19 DIAGNOSIS — Z515 Encounter for palliative care: Secondary | ICD-10-CM | POA: Diagnosis not present

## 2021-05-19 DIAGNOSIS — K219 Gastro-esophageal reflux disease without esophagitis: Secondary | ICD-10-CM | POA: Diagnosis not present

## 2021-05-19 LAB — BASIC METABOLIC PANEL
Anion gap: 11 (ref 5–15)
BUN: 20 mg/dL (ref 8–23)
CO2: 27 mmol/L (ref 22–32)
Calcium: 8.4 mg/dL — ABNORMAL LOW (ref 8.9–10.3)
Chloride: 101 mmol/L (ref 98–111)
Creatinine, Ser: 0.62 mg/dL (ref 0.44–1.00)
GFR, Estimated: >60 mL/min (ref >60)
Glucose, Bld: 99 mg/dL (ref 70–99)
Potassium: 4.6 mmol/L (ref 3.5–5.1)
Sodium: 139 mmol/L (ref 135–145)

## 2021-05-19 LAB — CBC
HCT: 27.7 % — ABNORMAL LOW (ref 36.0–46.0)
Hemoglobin: 8.9 g/dL — ABNORMAL LOW (ref 12.0–15.0)
MCH: 27.9 pg (ref 26.0–34.0)
MCHC: 32.1 g/dL (ref 30.0–36.0)
MCV: 86.8 fL (ref 80.0–100.0)
Platelets: 276 10*3/uL (ref 150–400)
RBC: 3.19 MIL/uL — ABNORMAL LOW (ref 3.87–5.11)
RDW: 18.3 % — ABNORMAL HIGH (ref 11.5–15.5)
WBC: 10 10*3/uL (ref 4.0–10.5)
nRBC: 0.6 % — ABNORMAL HIGH (ref 0.0–0.2)

## 2021-05-19 LAB — STREP PNEUMONIAE URINARY ANTIGEN: Strep Pneumo Urinary Antigen: NEGATIVE

## 2021-05-19 LAB — MRSA NEXT GEN BY PCR, NASAL: MRSA by PCR Next Gen: NOT DETECTED

## 2021-05-19 LAB — PROCALCITONIN: Procalcitonin: 0.38 ng/mL

## 2021-05-19 MED ORDER — LEVOFLOXACIN 500 MG PO TABS
500.0000 mg | ORAL_TABLET | Freq: Every day | ORAL | Status: DC
  Administered 2021-05-19 – 2021-05-27 (×7): 500 mg via ORAL
  Filled 2021-05-19 (×7): qty 1

## 2021-05-19 MED ORDER — ASCORBIC ACID 500 MG PO TABS
250.0000 mg | ORAL_TABLET | Freq: Two times a day (BID) | ORAL | Status: DC
  Administered 2021-05-19 – 2021-05-28 (×17): 250 mg via ORAL
  Filled 2021-05-19 (×17): qty 1

## 2021-05-19 MED ORDER — GABAPENTIN 300 MG PO CAPS
600.0000 mg | ORAL_CAPSULE | Freq: Every day | ORAL | Status: DC
  Administered 2021-05-19 – 2021-05-28 (×10): 600 mg via ORAL
  Filled 2021-05-19 (×10): qty 2

## 2021-05-19 MED ORDER — COLLAGENASE 250 UNIT/GM EX OINT
TOPICAL_OINTMENT | Freq: Every day | CUTANEOUS | Status: DC
  Administered 2021-05-27 – 2021-05-28 (×2): 1 via TOPICAL
  Filled 2021-05-19: qty 30

## 2021-05-19 MED ORDER — JUVEN PO PACK
1.0000 | PACK | Freq: Two times a day (BID) | ORAL | Status: DC
  Administered 2021-05-19 – 2021-05-27 (×12): 1 via ORAL
  Filled 2021-05-19 (×27): qty 1

## 2021-05-19 MED ORDER — ZINC SULFATE 220 (50 ZN) MG PO CAPS
220.0000 mg | ORAL_CAPSULE | Freq: Every day | ORAL | Status: DC
  Administered 2021-05-19 – 2021-05-28 (×10): 220 mg via ORAL
  Filled 2021-05-19 (×10): qty 1

## 2021-05-19 MED ORDER — MORPHINE SULFATE (PF) 4 MG/ML IV SOLN
4.0000 mg | INTRAVENOUS | Status: DC | PRN
  Administered 2021-05-22 – 2021-05-27 (×20): 4 mg via INTRAVENOUS
  Filled 2021-05-19 (×21): qty 1

## 2021-05-19 MED ORDER — GABAPENTIN 300 MG PO CAPS
300.0000 mg | ORAL_CAPSULE | Freq: Two times a day (BID) | ORAL | Status: DC
  Administered 2021-05-19 – 2021-05-28 (×17): 300 mg via ORAL
  Filled 2021-05-19 (×18): qty 1

## 2021-05-19 MED ORDER — BOOST / RESOURCE BREEZE PO LIQD CUSTOM
1.0000 | Freq: Two times a day (BID) | ORAL | Status: DC
  Administered 2021-05-19 – 2021-05-28 (×9): 1 via ORAL

## 2021-05-19 MED ORDER — ADULT MULTIVITAMIN W/MINERALS CH
1.0000 | ORAL_TABLET | Freq: Every day | ORAL | Status: DC
  Administered 2021-05-19 – 2021-05-28 (×8): 1 via ORAL
  Filled 2021-05-19 (×8): qty 1

## 2021-05-19 NOTE — Consult Note (Signed)
NAME:  [name], MRN:  [id number], DOB:  1945-07-21, LOS: 0 ADMISSION DATE:  05/30/2021, CONSULTATION DATE:  05/19/21 REFERRING MD:  [facility], CHIEF COMPLAINT:  MAI/ bronchiectasis  with new effusion, lung nodule   History of Present Illness:  76 yowf quit smoking 60 y PTA with melena and followed by [facility] for MAI with last rx June 2022 tol poorly and since then general state of decline, uses w/c to go anyhere outside the house with rattling cough productive of white mucus and doe x more than room to room though also limited by weakness.   Denies dysphagia or cp fever chills or NS   Pertinent  Medical History  Anemia  Arthritis, Atrial fibrillation  CAD  COPD\  Diverticulitis  Erythropoietin deficiency anemia   Essential hypertension Fibromyalgia Gastroparesis GERD Hepatitis History of pneumonia MRSA infection,  gastric ulcer Iron malabsorption   LBBB (left bundle branch block)  Oxygen dependent Pacemaker Pleural effusion,  Primary dilated cardiomyopathy  Silent aspiration Small bowel obstruction Urinary incontinence Venous embolism and thrombosis of subclavian vein (  Vitamin B12 deficiency   Significant Hospital Events: Including procedures, antibiotic start and stop dates in addition to other pertinent events   Admit 6E   Scheduled Meds:  atorvastatin  20 mg Oral QHS   Chlorhexidine Gluconate Cloth  6 each Topical Daily   collagenase   Topical Daily   gabapentin  300 mg Oral BID   And   gabapentin  600 mg Oral QHS   sodium chloride flush  10-40 mL Intracatheter Q12H   umeclidinium bromide  1 puff Inhalation Daily   Continuous Infusions:  azithromycin (ZITHROMAX) 500 MG IVPB (Vial-Mate [employer]or)     cefTRIAXone (ROCEPHIN)  IV     pantoprazole 8 mg/hr (05/19/21 0500)   PRN Meds:.oxyCODONE **AND** acetaminophen, albuterol, hydrALAZINE, morphine injection, ondansetron **OR** ondansetron (ZOFRAN) IV, sodium chloride flush, zolpidem   Interim History /  Subjective:  Lying quietly in bed on 4lpm NP nad cc dry mouth as npo for endoscopy  Objective   Blood pressure (S) (!) 142/84, pulse (S) (!) 105, temperature (S) 99.9 F (37.7 C), temperature source (S) Oral, resp. rate (S) 14, height 5' (1.524 m), weight 43.4 kg, SpO2 98 %.        Intake/Output Summary (Last 24 hours) at 05/19/2021 1147 Last data filed at 05/19/2021 1011 Gross per 24 hour  Intake 890.53 ml  Output 570 ml  Net 320.53 ml   Filed Weights   05/22/2021 1513 06/11/2021 2105 05/19/21 0420  Weight: 38.6 kg 39.8 kg 43.4 kg    Examination: General: chronically ill appearly elderly wf > state age in appearance HENT: orophx with dry mucsa Lungs: minimal insp /exp rhonchi Cardiovascular: RRR no s3 Abdomen: soft/ benign Extremities: no clubbing or edema Neuro: intact sensorium, no obvious motor def   Ct chest with contrast 06/05/2021  Interval development of masslike consolidation within the a right upper lobe in an area of more focal consolidation noted on prior examination suspicious for primary bronchogenic malignancy. Associated shotty right pre-vascular, and periaortic adenopathy as well as necrotic right hilar adenopathy. Interval development of numerous lytic lesions throughout the visualized axial skeleton. PET CT examination may be helpful for further evaluation.   Superimposed changes of a chronic atypical infection or infectious bronchiolitis with bronchiolectasis, airway impaction, and peripheral subpleural consolidation reticulation grossly stable since prior examination.   Interval development of partially loculated right pleural effusion. Diagnostic thoracentesis may be helpful for further management.  Extensive multi-vessel coronary artery calcification. Morphologic changes in keeping with pulmonary arterial hypertension.   Aortic Atherosclerosis (ICD10-I70.0).       Assessment & Plan:  1) New lung nodule/ adenopathy in pt with MAI/ bronchiectasis and  remote smoking hx will need w/u as out pt with PET but would rx with levaquin 500 mg daily x 10 days and then do the PET at 2 weeks with f/u by Dr Halford Chessman to follow.  2) new small anterior loculated R effusion - no need to attempt t centesis yet, will see if hot on PET 1st. If question of septic source it would make sense to attempt drainage, though this is not the case here.   Nothing further to add  at this point to  inpatient care.    Best Practice (right click and "Reselect all SmartList Selections" daily)  Per [facility]  Labs   CBC: Recent Labs  Lab 06/10/2021 1605 05/19/21 0523  WBC 10.8* 10.0  NEUTROABS 8.9*  --   HGB 6.0* 8.9*  HCT 20.3* 27.7*  MCV 93.5 86.8  PLT 308 119    Basic Metabolic Panel: Recent Labs  Lab 05/29/2021 1605 05/19/21 0523  NA 138 139  K 4.1 4.6  CL 97* 101  CO2 29 27  GLUCOSE 99 99  BUN 30* 20  CREATININE 1.03* 0.62  CALCIUM 8.9 8.4*   GFR: Estimated Creatinine Clearance: 41 mL/min (by C-G formula based on SCr of 0.62 mg/dL). Recent Labs  Lab 06/15/2021 1605 05/30/2021 1727 05/19/21 0523  PROCALCITON  --   --  0.38  WBC 10.8*  --  10.0  LATICACIDVEN  --  0.8  --     Liver Function Tests: Recent Labs  Lab 05/25/2021 1605  AST 52*  ALT 12  ALKPHOS 115  BILITOT 1.2  PROT 6.7  ALBUMIN 2.9*   No results for input(s): LIPASE, AMYLASE in the last 168 hours. No results for input(s): AMMONIA in the last 168 hours.  ABG    Component Value Date/Time   PHART 7.396 12/24/2009 2245   PCO2ART 48.9 (H) 12/24/2009 2245   PO2ART 67.0 (L) 12/24/2009 2245   HCO3 30.4 (H) 06/04/2021 1605   TCO2 30 06/19/2017 2336   O2SAT 73.2 05/17/2021 1605     Coagulation Profile: Recent Labs  Lab 06/07/2021 1605  INR 1.4*    Cardiac Enzymes: No results for input(s): CKTOTAL, CKMB, CKMBINDEX, TROPONINI in the last 168 hours.  HbA1C: Hgb A1c MFr Bld  Date/Time Value Ref Range Status  05/31/2017 12:46 AM 5.2 4.8 - 5.6 % Final    Comment:    (NOTE) Pre  diabetes:          5.7%-6.4% Diabetes:              >6.4% Glycemic control for   <7.0% adults with diabetes   08/17/2007 05:00 PM   Final   5.6 (NOTE)   The ADA recommends the following therapeutic goals for glycemic   control related to Hgb A1C measurement:   Goal of Therapy:   < 7.0% Hgb A1C   Action Suggested:  > 8.0% Hgb A1C   Ref:  Diabetes Care, 22, Suppl. 1, 1999    CBG: No results for input(s): GLUCAP in the last 168 hours.     Past Medical History:  She,  has a past medical history of Anemia, Arthritis, Atrial fibrillation ([place]), CAD (coronary artery disease), COPD (chronic obstructive pulmonary disease) ([place]), Diverticulitis, Elbow fracture, left (aug 2012), Erythropoietin deficiency  anemia (06/19/2018), Essential hypertension, Fibromyalgia, Gastroparesis, GERD (gastroesophageal reflux disease), H/O: GI bleed, Hepatitis, History of pneumonia, MRSA infection, gastric ulcer, stress fx (10/2009), Iron malabsorption (08/14/2017), LBBB (left bundle branch block), Oxygen dependent, Pacemaker, Pleural effusion, Primary dilated cardiomyopathy ([place]), RLS (restless legs syndrome), Silent aspiration, Small bowel obstruction ([place]), Urinary incontinence, Venous embolism and thrombosis of subclavian vein ([place]), Vitamin B12 deficiency, and Wears glasses.   Surgical History:   Past Surgical History:  Procedure Laterality Date   APPENDECTOMY     BIV PACEMAKER GENERATOR CHANGEOUT N/A 06/19/2019   Procedure: BIV PACEMAKER GENERATOR CHANGEOUT;  Surgeon: Deboraha Sprang, MD;  Location: [facility];  Service: Cardiovascular;  Laterality: N/A;   BRONCHIAL WASHINGS  05/25/2019   Procedure: BRONCHIAL WASHINGS;  Surgeon: Chesley Mires, MD;  Location: [facility];  Service: Endoscopy;;   CARDIAC CATHETERIZATION  04/08/2006, 11/16/2009   CHOLECYSTECTOMY N/A 06/27/2017   Procedure: OPEN CHOLECYSTECTOMY;  Surgeon: Ralene Ok, MD;  Location: [facility];  Service: General;  Laterality: N/A;   COLECTOMY      Intestinal resection (5times)   COLECTOMY  02/04/2000   ex. lap., intra-abd. subtotal colectomy with ileosigmoid colon anastomosies and lysis of adhesions   CYSTOSCOPY  11/11/2008   CYSTOSCOPY W/ RETROGRADES  11/11/2008   right   CYSTOSCOPY WITH INJECTION  04/30/2006   transurethral collagen injection; incision vaginal stenosis   ERCP N/A 06/23/2017   Procedure: ENDOSCOPIC RETROGRADE CHOLANGIOPANCREATOGRAPHY (ERCP);  Surgeon: Carol Ada, MD;  Location: [facility];  Service: Endoscopy;  Laterality: N/A;   ERCP N/A 06/24/2017   Procedure: ENDOSCOPIC RETROGRADE CHOLANGIOPANCREATOGRAPHY (ERCP);  Surgeon: [name], MD;  Location[facility];  Service: Endoscopy;  Laterality: N/A;   ESOPHAGOGASTRODUODENOSCOPY N/A 02/02/2013   Procedure: ESOPHAGOGASTRODUODENOSCOPY (EGD);  Surgeon: Lafayette Dragon, MD;  Location: [facility];  Service: Endoscopy;  Laterality: N/A;   EXPLORATORY LAPAROTOMY  04/27/2009   lysis of adhesions, gastrostomy tube   GASTROCUTANEOUS FISTULA CLOSURE     INTERSTIM IMPLANT PLACEMENT  05/28/2006 - stage I   06/05/2006 - stage II   INTERSTIM IMPLANT PLACEMENT  02/05/2012   Procedure: [name] IMPLANT FIRST STAGE;  Surgeon: Reece Packer, MD;  Location: [facility];  Service: Urology;  Laterality: N/A;  Replacement of Interstim Lead      INTERSTIM IMPLANT REVISION  03/06/2011   Procedure: REVISION OF [name];  Surgeon: Reece Packer, MD;  Location: [facility];  Service: Urology;  Laterality: N/A;  Replacement of Neurostimulator   INTERSTIM IMPLANT REVISION  10/23/2007   IR IMAGING GUIDED PORT INSERTION  10/14/2017   PACEMAKER PLACEMENT  12/21/2009   Peg removed     with complications   PORT-A-CATH REMOVAL  07/27/2011   Procedure: REMOVAL PORT-A-CATH;  Surgeon: Rolm Bookbinder, MD;  Location: [facility];  Service: General;  Laterality: N/A;   PORTACATH PLACEMENT  12/16/2009   SAVORY DILATION N/A 02/02/2013   Procedure: SAVORY DILATION;  Surgeon: Lafayette Dragon, MD;  Location[facility];  Service: Endoscopy;  Laterality: N/A;   SMALL INTESTINE SURGERY  05/20/2001   ex. lap., resection of small bowel stricture; gastrostomy; insertion central line   TEE WITHOUT CARDIOVERSION N/A 07/17/2019   Procedure: TRANSESOPHAGEAL ECHOCARDIOGRAM (TEE);  Surgeon: Donato Heinz, MD;  [facility];  Service: Cardiovascular;  Laterality: N/A;   TONSILLECTOMY  age 42   TOTAL ABDOMINAL HYSTERECTOMY     complete   TOTAL HIP ARTHROPLASTY  08/03/2011   Procedure: TOTAL HIP ARTHROPLASTY ANTERIOR APPROACH;  Surgeon: Mcarthur Rossetti,  MD;  Location: [facility];  Service: Orthopedics;  Laterality: Right;   VIDEO BRONCHOSCOPY N/A 05/25/2019   Procedure: VIDEO BRONCHOSCOPY WITHOUT FLUORO;  Surgeon: Chesley Mires, MD;  Location[facility];  Service: Endoscopy;  Laterality: N/A;     Social History:   reports that she quit smoking about 35 years ago. Her smoking use included cigarettes. She has a 20.00 pack-year smoking history. She has never used smokeless tobacco. She reports that she does not drink alcohol and does not use drugs.   Family History:  Her family history includes Breast cancer in her maternal aunt; Diabetes in her paternal grandfather; Heart disease in her brother, father, mother, and paternal aunt; Stroke in her mother. There is no history of Colon cancer.   Allergies Allergies  Allergen Reactions   Dabigatran Etexilate Mesylate Other (See Comments)    INTERNAL BLEEDING- Pradaxa   Augmentin [Amoxicillin-Pot Clavulanate] Diarrhea and Nausea Only    Did it involve swelling of the face/tongue/throat, SOB, or low BP? No Did it involve sudden or severe rash/hives, skin peeling, or any reaction on the inside of your mouth or nose? No Did you need to seek medical attention at a hospital or doctor's office? No When did it last happen? 02/2019 If all above answers are "NO", may proceed with cephalosporin use.    Talwin [Pentazocine] Other  (See Comments)    Hallucinations      Home Medications  Prior to Admission medications   Medication Sig Start Date End Date Taking? Authorizing Provider  acetaminophen (TYLENOL) 650 MG CR tablet Take 650 mg by mouth every 8 (eight) hours as needed for pain.   Yes [provider]  albuterol (VENTOLIN HFA) 108 (90 Base) MCG/ACT inhaler Inhale 2 puffs into the lungs every 6 (six) hours as needed for wheezing or shortness of breath.   Yes [provider]  amLODipine (NORVASC) 2.5 MG tablet TAKE 1 TABLET BY MOUTH AT BEDTIME Patient taking differently: Take 2.5 mg by mouth at bedtime. 03/29/21  Yes Minus Breeding, MD  apixaban (ELIQUIS) 5 MG TABS tablet Take 1 tablet (5 mg total) by mouth 2 (two) times daily. 09/08/20  Yes Roma Schanz R, DO  atorvastatin (LIPITOR) 20 MG tablet TAKE 1 TABLET BY MOUTH ONCE DAILY AT  6PM Patient taking differently: Take 20 mg by mouth every evening. 11/23/20  Yes Roma Schanz R, DO  gabapentin (NEURONTIN) 600 MG tablet TAKE 1 TABLET BY MOUTH ONCE DAILY IN THE MORNING, AND  AT  NOON,  AND  IN  THE  EVENING,  AND  AT  BEDTIME Patient taking differently: Take 600 mg by mouth See admin instructions. TAKE 600mg   BY MOUTH ONCE DAILY IN THE MORNING, AND AT NOON, AND IN THE EVENING, AND AT BEDTIME 04/24/21  Yes Lowne Chase, Yvonne R, DO  levofloxacin (LEVAQUIN) 500 MG tablet Take 1 tablet (500 mg total) by mouth daily. 04/28/21  Yes Martyn Ehrich, NP  oxyCODONE-acetaminophen (PERCOCET) 10-325 MG tablet Take 1 tablet by mouth every 4 (four) hours as needed. PAIN 05/17/21  Yes Roma Schanz R, DO  OXYGEN Inhale 4 L/min into the lungs continuous.   Yes [provider]  pantoprazole (PROTONIX) 40 MG tablet Take 1 tablet (40 mg total) by mouth 2 (two) times daily. Patient taking differently: Take 40 mg by mouth daily. 12/20/20  Yes Gatha Mayer, MD  promethazine (PHENERGAN) 25 MG tablet Take 1 tablet (25 mg total) by mouth every 6 (six)  hours  as needed for nausea or vomiting. Patient taking differently: Take 12.5-25 mg by mouth every 6 (six) hours as needed for nausea or vomiting. 12/20/20  Yes Gatha Mayer, MD  tiZANidine (ZANAFLEX) 4 MG tablet Take 1 tablet (4 mg total) by mouth 3 (three) times daily as needed. for muscle spams 05/16/21  Yes Roma Schanz R, DO  triamcinolone ointment (KENALOG) 0.1 % Apply 1 application topically daily as needed (to affected areas- for psoriasis).  03/19/18  Yes [provider]  umeclidinium bromide (INCRUSE ELLIPTA) 62.5 MCG/ACT AEPB Inhale 1 puff into the lungs daily. Patient taking differently: Inhale 1 puff into the lungs daily as needed (for shortness of breath). 03/03/21  Yes Roma Schanz R, DO  zolpidem (AMBIEN) 5 MG tablet TAKE 1 TABLET BY MOUTH EVERY DAY AT BEDTIME AS NEEDED Patient taking differently: Take 5 mg by mouth at bedtime as needed for sleep. 04/18/21  Yes Roma Schanz R, DO  furosemide (LASIX) 40 MG tablet TAKE 1 TABLET BY MOUTH ONCE DAILY IN THE MORNING Patient not taking: Reported on 05/24/2021 02/14/21   Carollee Herter, Alferd Apa, DO  nitroGLYCERIN (NITROSTAT) 0.4 MG SL tablet Place 1 tablet (0.4 mg total) under the tongue every 5 (five) minutes as needed. CHEST PAIN Patient not taking: Reported on 05/29/2021 06/01/19   Minus Breeding, MD  iron dextran complex (INFED) 50 MG/ML injection Please give infed infusion (no test dose needed) over 4 hours per pharmacy calculated dose. Ht: 5'2 Wt: 99 pounds Hgb: 12 12/20/10 12/16/18  Lafayette Dragon, MD        Christinia Gully, MD Pulmonary and [place] Cell [phone]   After 7:00 pm call Elink  [phone]

## 2021-05-19 NOTE — Assessment & Plan Note (Addendum)
-   Continue local wound care

## 2021-05-19 NOTE — Progress Notes (Signed)
Initial Nutrition Assessment ? ?DOCUMENTATION CODES:  ? ?Non-severe (moderate) malnutrition in context of chronic illness ? ?INTERVENTION:  ?- will order Boost Breeze BID, each supplement provides 250 kcal and 9 grams of protein ?- will order 1 packet Juven BID, each packet provides 95 calories, 2.5 grams of protein (collagen), and 9.8 grams of carbohydrate (3 grams sugar); also contains 7 grams of L-arginine and L-glutamine, 300 mg vitamin C, 15 mg vitamin E, 1.2 mcg vitamin B-12, 9.5 mg zinc, 200 mg calcium, and 1.5 g  Calcium Beta-hydroxy-Beta-methylbutyrate to support wound healing. ?- will order 1 tablet Multivitamin with minerals daily ?- diet advancement as medically feasible.  ?- will check serum copper d/t wound. ?- recommend 250 mg ascorbic acid BID and 220 mg zinc sulfate/day x14 days to aid in wound healing. ? ? ?NUTRITION DIAGNOSIS:  ? ?Moderate Malnutrition related to chronic illness as evidenced by moderate fat depletion, moderate muscle depletion. ? ?GOAL:  ? ?Patient will meet greater than or equal to 90% of their needs ? ?MONITOR:  ? ?PO intake, Supplement acceptance, Diet advancement, Labs, Weight trends, Skin ? ?REASON FOR ASSESSMENT:  ? ?Consult ?Assessment of nutrition requirement/status ? ?ASSESSMENT:  ? ?76 y.o. female with medical history of afib/flutter on Eliquis, CAD, COPD, recent wrist fracture, s/p PPM, MAI infection, chronic pain, bronchiectasis, GERD, RLS, urinary incontinence, gastroparesis, vitamin B12 deficiency, anemia, arthritis, diverticulitis, fibromyalgia, LBBB, erythropoietin deficiency, HTN, and chronic CHF. She presented to the ED due to worsening back pain and weakness. Concern for melena and upper GIB. In the ED, FOBT was positive and CXR was concerning for PNA. CT chest showed RUL consolidation concerning for mass and Pulmonology was consulted. ? ?Patient laying in bed with son at bedside. Diet advanced to CLD at 24 and at the time of RD visit the only thing she had  had was ice chips. Reviewed menu with patient and ordered her cranberry juice, apple juice, strawberry icee, and chicken broth. ? ?Patient denies abdominal discomfort or nausea at the time of RD visit. She reports decreased appetite x2-3 weeks PTA. She did not necessarily eat less during that time but did not feel as up to eating.  ? ?At home she drinks Resource/Boost Breeze. Talked with her about Juven and she is agreeable to this supplement also. She does not take a multivitamin or other vitamin or mineral supplements but is agreeable to aid in wound healing. ? ?Weight today is 96 lb and weight on 2/7 was 89 lb which indicates 7 lb weight gain x1 month.  ? ?Per notes: ?- symptomatic anemia s/p 2 units PRBC ?- RUL consolidation concerning for malignancy ?- recurrent falls ?- multifocal PNA ? ? ?Labs reviewed; Ca: 8.4 mg/dl. ?Medications reviewed; 8 mg/hr IV protonix. ?  ? ?NUTRITION - FOCUSED PHYSICAL EXAM: ? ?Flowsheet Row Most Recent Value  ?Orbital Region Mild depletion  ?Upper Arm Region Moderate depletion  ?Thoracic and Lumbar Region Unable to assess  ?Buccal Region Mild depletion  ?Temple Region Moderate depletion  ?Clavicle Bone Region Moderate depletion  ?Clavicle and Acromion Bone Region Moderate depletion  ?Scapular Bone Region Moderate depletion  ?Dorsal Hand Mild depletion  ?Patellar Region Moderate depletion  ?Anterior Thigh Region Mild depletion  ?Posterior Calf Region Mild depletion  ?Edema (RD Assessment) Mild  [BLE]  ?Hair Reviewed  ?Eyes Reviewed  ?Mouth Reviewed  ?Skin Reviewed  ?Nails Unable to assess  [nail polish]  ? ?  ? ? ?Diet Order:   ?Diet Order   ? ?       ?  Diet clear liquid Room service appropriate? Yes; Fluid consistency: Thin  Diet effective now       ?  ? ?  ?  ? ?  ? ? ?EDUCATION NEEDS:  ? ?Education needs have been addressed ? ?Skin:  Skin Assessment: Skin Integrity Issues: ?Skin Integrity Issues:: Stage III ?Stage III: sacrum ? ?Last BM:  PTA/unknown ? ?Height:  ? ?Ht Readings  from Last 1 Encounters:  ?05/31/2021 5' (1.524 m)  ? ? ?Weight:  ? ?Wt Readings from Last 1 Encounters:  ?05/19/21 43.4 kg  ? ? ? ?BMI:  Body mass index is 18.69 kg/m?. ? ?Estimated Nutritional Needs:  ?Kcal:  1700-1900 kcal ?Protein:  80-95 grams ?Fluid:  >/= 1.8 L/day ? ? ? ? ?Jarome Matin, MS, RD, LDN ?Inpatient Clinical Dietitian ?RD pager # available in [place]  ?After hours/weekend pager # available in [place] ? ?

## 2021-05-19 NOTE — Consult Note (Signed)
Referring Provider: EDP Primary Care Physician:  Carollee Herter, Alferd Apa, DO Primary Gastroenterologist:  Dr. Carlean Purl  Reason for Consultation:  Symptomatic anemia and heme positive stool  HPI: [name] is a 76 y.o. female with past medical history of atrial fibrillation on Eliquis at home with last dose on 2/27 AM as she had to hold it for a back injection that she had performed yesterday, history of coronary artery disease, COPD/emphysema on home O2, hypertension, fibromyalgia/chronic pain, chronic anemia for which she is followed by Dr. Marin Olp, GERD/gastroparesis and slow gut motility, history of small bowel obstructions and several bowel resections.  She presented to [facility] ED with complaints of worsening back pain, difficulty with standing/falls at home, poor PO intake, lightheadedness.  Her husband reported dark stools lat week.  No red blood per rectum.  No vomiting.  No abdominal pain.  Hgb was down to 6.0 g from her baseline of 10.5 g just a month ago.  She received 2 units of packed red blood cells and this morning hemoglobin is up to 8.9 g.  She was heme positive in the ED.  She is on pantoprazole 40 mg twice daily at home.  Denies any NSAID use.  She denies heartburn or reflux and feels like her PPI controls the symptoms well, but over the past 3 weeks or so she does admit to dysphagia with both solids and liquids.  They say that it used to be intermittent, but has been more frequent/more regular over the past few weeks.  Past Medical History:  Diagnosis Date   Anemia    Arthritis    Atrial fibrillation ([place])    CAD (coronary artery disease)    Mild disease per cath in 2011   COPD (chronic obstructive pulmonary disease) ([place])    Home O2   Diverticulitis    Elbow fracture, left aug 2012   Erythropoietin deficiency anemia 06/19/2018   Essential hypertension    Fibromyalgia    Gastroparesis    GERD (gastroesophageal reflux disease)    H/O: GI bleed    from Pradaxa   Hepatitis     Hx of in high school    History of pneumonia    Recurrent   Hx MRSA infection    Hx of gastric ulcer    Hx of stress fx 10/2009   Right hip    Iron malabsorption 08/14/2017   LBBB (left bundle branch block)    Oxygen dependent    2 liters via nasal cannula at all times   Pacemaker    CRT therapy; followed by Dr. Caryl Comes   Pleural effusion     s/p right thoracentesis 03/09   Primary dilated cardiomyopathy ([place])    EF 45 to 50% per echo in Jan 2012   RLS (restless legs syndrome)    Silent aspiration    Small bowel obstruction ([facility])    Urinary incontinence    Venous embolism and thrombosis of subclavian vein ([place])    After pacemaker insertion Oct 2011   Vitamin B12 deficiency    Wears glasses    Reading    Past Surgical History:  Procedure Laterality Date   APPENDECTOMY     BIV PACEMAKER GENERATOR CHANGEOUT N/A 06/19/2019   Procedure: BIV PACEMAKER GENERATOR CHANGEOUT;  Surgeon: Deboraha Sprang, MD;  Location: [place] [facility];  Service: Cardiovascular;  Laterality: N/A;   BRONCHIAL WASHINGS  05/25/2019   Procedure: BRONCHIAL WASHINGS;  Surgeon: Chesley Mires, MD;  Location: [facility];  Service: Endoscopy;;   CARDIAC CATHETERIZATION  04/08/2006, 11/16/2009   CHOLECYSTECTOMY N/A 06/27/2017   Procedure: OPEN CHOLECYSTECTOMY;  Surgeon: Ralene Ok, MD;  Location: [place];  Service: General;  Laterality: N/A;   COLECTOMY     Intestinal resection (5times)   COLECTOMY  02/04/2000   ex. lap., intra-abd. subtotal colectomy with ileosigmoid colon anastomosies and lysis of adhesions   CYSTOSCOPY  11/11/2008   CYSTOSCOPY W/ RETROGRADES  11/11/2008   right   CYSTOSCOPY WITH INJECTION  04/30/2006   transurethral collagen injection; incision vaginal stenosis   ERCP N/A 06/23/2017   Procedure: ENDOSCOPIC RETROGRADE CHOLANGIOPANCREATOGRAPHY (ERCP);  Surgeon: Carol Ada, MD;  Location: [address];  Service: Endoscopy;  Laterality: N/A;   ERCP N/A 06/24/2017   Procedure: ENDOSCOPIC  RETROGRADE CHOLANGIOPANCREATOGRAPHY (ERCP);  Surgeon: [name], MD;  Location: [facility] ENDOSCOPY;  Service: Endoscopy;  Laterality: N/A;   ESOPHAGOGASTRODUODENOSCOPY N/A 02/02/2013   Procedure: ESOPHAGOGASTRODUODENOSCOPY (EGD);  Surgeon: Lafayette Dragon, MD;  Location: [facility];  Service: Endoscopy;  Laterality: N/A;   EXPLORATORY LAPAROTOMY  04/27/2009   lysis of adhesions, gastrostomy tube   GASTROCUTANEOUS FISTULA CLOSURE     INTERSTIM IMPLANT PLACEMENT  05/28/2006 - stage I   06/05/2006 - stage II   INTERSTIM IMPLANT PLACEMENT  02/05/2012   Procedure: [name] IMPLANT FIRST STAGE;  Surgeon: Reece Packer, MD;  Location: [facility];  Service: Urology;  Laterality: N/A;  Replacement of Interstim Lead      INTERSTIM IMPLANT REVISION  03/06/2011   Procedure: REVISION OF [name];  Surgeon: Reece Packer, MD;  Location: [facility];  Service: Urology;  Laterality: N/A;  Replacement of Neurostimulator   INTERSTIM IMPLANT REVISION  10/23/2007   IR IMAGING GUIDED PORT INSERTION  10/14/2017   PACEMAKER PLACEMENT  12/21/2009   Peg removed     with complications   PORT-A-CATH REMOVAL  07/27/2011   Procedure: REMOVAL PORT-A-CATH;  Surgeon: Rolm Bookbinder, MD;  Location: [place];  Service: General;  Laterality: N/A;   PORTACATH PLACEMENT  12/16/2009   SAVORY DILATION N/A 02/02/2013   Procedure: SAVORY DILATION;  Surgeon: Lafayette Dragon, MD;  [facility];  Service: Endoscopy;  Laterality: N/A;   SMALL INTESTINE SURGERY  05/20/2001   ex. lap., resection of small bowel stricture; gastrostomy; insertion central line   TEE WITHOUT CARDIOVERSION N/A 07/17/2019   Procedure: TRANSESOPHAGEAL ECHOCARDIOGRAM (TEE);  Surgeon: Donato Heinz, MD;  Location: [facility] ENDOSCOPY;  Service: Cardiovascular;  Laterality: N/A;   TONSILLECTOMY  age 39   TOTAL ABDOMINAL HYSTERECTOMY     complete   TOTAL HIP ARTHROPLASTY  08/03/2011   Procedure: TOTAL HIP ARTHROPLASTY ANTERIOR APPROACH;   Surgeon: Mcarthur Rossetti, MD;  Location: [facility];  Service: Orthopedics;  Laterality: Right;   VIDEO BRONCHOSCOPY N/A 05/25/2019   Procedure: VIDEO BRONCHOSCOPY WITHOUT FLUORO;  Surgeon: Chesley Mires, MD;  Location: [facility];  Service: Endoscopy;  Laterality: N/A;    Prior to Admission medications   Medication Sig Start Date End Date Taking? Authorizing Provider  acetaminophen (TYLENOL) 650 MG CR tablet Take 650 mg by mouth every 8 (eight) hours as needed for pain.   Yes [provider]  albuterol (VENTOLIN HFA) 108 (90 Base) MCG/ACT inhaler Inhale 2 puffs into the lungs every 6 (six) hours as needed for wheezing or shortness of breath.   Yes [provider]  amLODipine (NORVASC) 2.5 MG tablet TAKE 1 TABLET BY MOUTH AT BEDTIME Patient taking differently: Take 2.5 mg by mouth at bedtime.  03/29/21  Yes Minus Breeding, MD  apixaban (ELIQUIS) 5 MG TABS tablet Take 1 tablet (5 mg total) by mouth 2 (two) times daily. 09/08/20  Yes Roma Schanz R, DO  atorvastatin (LIPITOR) 20 MG tablet TAKE 1 TABLET BY MOUTH ONCE DAILY AT  6PM Patient taking differently: Take 20 mg by mouth every evening. 11/23/20  Yes Roma Schanz R, DO  gabapentin (NEURONTIN) 600 MG tablet TAKE 1 TABLET BY MOUTH ONCE DAILY IN THE MORNING, AND  AT  NOON,  AND  IN  THE  EVENING,  AND  AT  BEDTIME Patient taking differently: Take 600 mg by mouth See admin instructions. TAKE 600mg   BY MOUTH ONCE DAILY IN THE MORNING, AND AT NOON, AND IN THE EVENING, AND AT BEDTIME 04/24/21  Yes Lowne Chase, Yvonne R, DO  levofloxacin (LEVAQUIN) 500 MG tablet Take 1 tablet (500 mg total) by mouth daily. 04/28/21  Yes Martyn Ehrich, NP  oxyCODONE-acetaminophen (PERCOCET) 10-325 MG tablet Take 1 tablet by mouth every 4 (four) hours as needed. PAIN 05/17/21  Yes Roma Schanz R, DO  OXYGEN Inhale 4 L/min into the lungs continuous.   Yes [provider]  pantoprazole (PROTONIX) 40 MG tablet Take 1 tablet (40  mg total) by mouth 2 (two) times daily. Patient taking differently: Take 40 mg by mouth daily. 12/20/20  Yes Gatha Mayer, MD  promethazine (PHENERGAN) 25 MG tablet Take 1 tablet (25 mg total) by mouth every 6 (six) hours as needed for nausea or vomiting. Patient taking differently: Take 12.5-25 mg by mouth every 6 (six) hours as needed for nausea or vomiting. 12/20/20  Yes Gatha Mayer, MD  tiZANidine (ZANAFLEX) 4 MG tablet Take 1 tablet (4 mg total) by mouth 3 (three) times daily as needed. for muscle spams 05/16/21  Yes Roma Schanz R, DO  triamcinolone ointment (KENALOG) 0.1 % Apply 1 application topically daily as needed (to affected areas- for psoriasis).  03/19/18  Yes [provider]  umeclidinium bromide (INCRUSE ELLIPTA) 62.5 MCG/ACT AEPB Inhale 1 puff into the lungs daily. Patient taking differently: Inhale 1 puff into the lungs daily as needed (for shortness of breath). 03/03/21  Yes Roma Schanz R, DO  zolpidem (AMBIEN) 5 MG tablet TAKE 1 TABLET BY MOUTH EVERY DAY AT BEDTIME AS NEEDED Patient taking differently: Take 5 mg by mouth at bedtime as needed for sleep. 04/18/21  Yes Roma Schanz R, DO  furosemide (LASIX) 40 MG tablet TAKE 1 TABLET BY MOUTH ONCE DAILY IN THE MORNING Patient not taking: Reported on 06/11/2021 02/14/21   Carollee Herter, Alferd Apa, DO  nitroGLYCERIN (NITROSTAT) 0.4 MG SL tablet Place 1 tablet (0.4 mg total) under the tongue every 5 (five) minutes as needed. CHEST PAIN Patient not taking: Reported on 06/15/2021 06/01/19   Minus Breeding, MD  iron dextran complex (INFED) 50 MG/ML injection Please give infed infusion (no test dose needed) over 4 hours per pharmacy calculated dose. Ht: 5'2 Wt: 99 pounds Hgb: 12 12/20/10 12/16/18  Lafayette Dragon, MD    Current Facility-Administered Medications  Medication Dose Route Frequency Provider Last Rate Last Admin   oxyCODONE (Oxy IR/ROXICODONE) immediate release tablet 10 mg  10 mg Oral Q4H PRN Mariel Aloe, MD   10 mg at 05/19/21 0277   And   acetaminophen (TYLENOL) tablet 325 mg  325 mg Oral Q4H PRN Mariel Aloe, MD       albuterol (PROVENTIL) (2.5 MG/3ML) 0.083%  nebulizer solution 2.5 mg  2.5 mg Nebulization Q6H PRN Mariel Aloe, MD       atorvastatin (LIPITOR) tablet 20 mg  20 mg Oral QHS Mariel Aloe, MD   20 mg at 05/22/2021 2257   azithromycin (ZITHROMAX) 500 mg in sodium chloride 0.9 % 250 mL IVPB  500 mg Intravenous Q24H Mariel Aloe, MD       cefTRIAXone (ROCEPHIN) 1 g in sodium chloride 0.9 % 100 mL IVPB  1 g Intravenous Q24H Mariel Aloe, MD       Chlorhexidine Gluconate Cloth 2 % PADS 6 each  6 each Topical Daily Mariel Aloe, MD   6 each at 05/17/2021 2100   gabapentin (NEURONTIN) capsule 300 mg  300 mg Oral BID Mariel Aloe, MD       And   gabapentin (NEURONTIN) capsule 600 mg  600 mg Oral QHS Mariel Aloe, MD       hydrALAZINE (APRESOLINE) injection 10 mg  10 mg Intravenous Q6H PRN Mariel Aloe, MD       ondansetron (ZOFRAN) tablet 4 mg  4 mg Oral Q6H PRN Mariel Aloe, MD       Or   ondansetron (ZOFRAN) injection 4 mg  4 mg Intravenous Q6H PRN Mariel Aloe, MD       pantoprozole (PROTONIX) 80 mg /NS 100 mL infusion  8 mg/hr Intravenous Continuous Drenda Freeze, MD 10 mL/hr at 05/19/21 0500 8 mg/hr at 05/19/21 0500   sodium chloride flush (NS) 0.9 % injection 10-40 mL  10-40 mL Intracatheter Q12H Mariel Aloe, MD   10 mL at 05/17/2021 2142   sodium chloride flush (NS) 0.9 % injection 10-40 mL  10-40 mL Intracatheter PRN Mariel Aloe, MD       umeclidinium bromide (INCRUSE ELLIPTA) 62.5 MCG/ACT 1 puff  1 puff Inhalation Daily Mariel Aloe, MD   1 puff at 05/19/21 0848   zolpidem (AMBIEN) tablet 5 mg  5 mg Oral QHS PRN Mariel Aloe, MD   5 mg at 06/03/2021 2257    Allergies as of 05/31/2021 - Review Complete 05/22/2021  Allergen Reaction Noted   Dabigatran etexilate mesylate Other (See Comments) 06/26/2010   Augmentin  [amoxicillin-pot clavulanate] Diarrhea and Nausea Only 11/06/2018   Talwin [pentazocine] Other (See Comments) 09/12/2011    Family History  Problem Relation Age of Onset   Heart disease Father    Stroke Mother    Heart disease Mother    Heart disease Brother    Breast cancer Maternal Aunt    Heart disease Paternal Aunt    Diabetes Paternal Grandfather    Colon cancer Neg Hx     Social History   Socioeconomic History   Marital status: Married    Spouse name: Not on file   Number of children: 5   Years of education: Not on file   Highest education level: Not on file  Occupational History   Occupation: disabled    Employer: RETIRED  Tobacco Use   Smoking status: Former    Packs/day: 1.00    Years: 20.00    Pack years: 20.00    Types: Cigarettes    Quit date: 03/19/1986    Years since quitting: 35.1   Smokeless tobacco: Never  Vaping Use   Vaping Use: Never used  Substance and Sexual Activity   Alcohol use: No    Alcohol/week: 0.0 standard drinks   Drug use: No  Sexual activity: Yes  Other Topics Concern   Not on file  Social History Narrative   Occupation: Disabled   Married - second time - biologic daughter (in [place]) from first marriage - adopted 4 3 alive with second marriage   Alcohol Use - no   Illicit Drug Use - no   Patient is a former smoker, quit 1988 x25yrs, <1ppd.    Daily Caffeine Use   01/20/2015 - updated   Social Determinants of Health   Financial Resource Strain: Medium Risk   Difficulty of Paying Living Expenses: Somewhat hard  Food Insecurity: No Food Insecurity   Worried About Charity fundraiser in the Last Year: Never true   Ran Out of Food in the Last Year: Never true  Transportation Needs: No Transportation Needs   Lack of Transportation (Medical): No   Lack of Transportation (Non-Medical): No  Physical Activity: Insufficiently Active   Days of Exercise per Week: 6 days   Minutes of Exercise per Session: 20 min  Stress: No Stress  Concern Present   Feeling of Stress : Not at all  Social Connections: Moderately Integrated   Frequency of Communication with Friends and Family: More than three times a week   Frequency of Social Gatherings with Friends and Family: More than three times a week   Attends Religious Services: More than 4 times per year   Active Member of [employer] or Organizations: No   Attends Archivist Meetings: Never   Marital Status: Married  Human resources officer Violence: Not At Risk   Fear of Current or Ex-Partner: No   Emotionally Abused: No   Physically Abused: No   Sexually Abused: No    Review of Systems: ROS is O/W negative except as mentioned in HPI.  Physical Exam: Vital signs in last 24 hours: Temp:  [98 F (36.7 C)-99.9 F (37.7 C)] 99.9 F (37.7 C) (03/03 0420) Pulse Rate:  [105-125] 105 (03/03 0420) Resp:  [14-20] 14 (03/03 0420) BP: (114-161)/(68-85) 142/84 (03/03 0420) SpO2:  [94 %-100 %] 98 % (03/03 0848) Weight:  [38.6 kg-43.4 kg] 43.4 kg (03/03 0420)   General:  Alert, Chronically ill-appearing, pleasant and cooperative in NAD Head:  Normocephalic and atraumatic. Eyes:  Sclera clear, no icterus.  Conjunctiva pink. Ears:  Normal auditory acuity. Mouth:  No deformity or lesions.   Lungs:  Clear throughout to auscultation.  No wheezes, crackles, or rhonchi heard anteriorly. Heart:  Slightly tachy; no murmurs, clicks, rubs, or gallops. Abdomen:  Soft, non-distended.  BS present.  Non-tender.   Rectal:  Heme positive per EDP. Msk:  Symmetrical without gross deformities. Pulses:  Normal pulses noted. Extremities:  Without clubbing or edema. Neurologic:  Alert and oriented x 4;  grossly normal neurologically. Skin:  Intact without significant lesions or rashes. Psych:  Alert and cooperative. Normal mood and affect.  Intake/Output from previous day: 03/02 0701 - 03/03 0700 In: 890.5 [I.V.:30.7; Blood:630; IV Piggyback:229.8] Out: 570 [Urine:570]  Lab Results: Recent  Labs    06/06/2021 1605 05/19/21 0523  WBC 10.8* 10.0  HGB 6.0* 8.9*  HCT 20.3* 27.7*  PLT 308 276   BMET Recent Labs    05/21/2021 1605 05/19/21 0523  NA 138 139  K 4.1 4.6  CL 97* 101  CO2 29 27  GLUCOSE 99 99  BUN 30* 20  CREATININE 1.03* 0.62  CALCIUM 8.9 8.4*   LFT Recent Labs    05/19/2021 1605  PROT 6.7  ALBUMIN 2.9*  AST 52*  ALT 12  ALKPHOS 115  BILITOT 1.2   PT/INR Recent Labs    05/22/2021 1605  LABPROT 16.7*  INR 1.4*   Studies/Results: DG Chest 1 View  Result Date: 06/03/2021 CLINICAL DATA:  [place], dehydration EXAM: CHEST  1 VIEW COMPARISON:  04/26/2021 FINDINGS: Single frontal view of the chest demonstrates right chest wall port unchanged. Multi lead pacer is stable. Cardiac silhouette is unremarkable. Slight progression of the multifocal right-sided pneumonia seen previously. Trace right pleural effusion unchanged. Slight increased interstitial and ground-glass opacity within the left upper lobe. Right apical pleural thickening is unchanged since 04/10/2021, but has noticeably progressed since the 07/15/2020 CT and 02/03/2020 x-ray. No acute bony abnormalities. IMPRESSION: 1. Progressive multifocal bilateral airspace disease as above, consistent with worsening pneumonia. 2. Asymmetric right apical pleural thickening, which has progressed since the 11/05/2020 chest CT. 3. Given the persistent airspace disease and prominent right apical pleural thickening, follow-up chest CT with IV contrast may be useful to exclude underlying neoplasm. Electronically Signed   By: Randa Ngo M.D.   On: 06/06/2021 17:09   DG Lumbar Spine Complete  Result Date: 06/14/2021 CLINICAL DATA:  Dehydration, previous fall EXAM: LUMBAR SPINE - COMPLETE 4+ VIEW COMPARISON:  04/24/2021 FINDINGS: Negative for fracture. Moderate narrowing of the interspace L4-5 with stable grade 1 anterolisthesis. Implanted sacral stimulator device. Right hip arthroplasty components partially visualized.  Cholecystectomy clips. Aortic Atherosclerosis (ICD10-170.0). IMPRESSION: No acute findings.  Stable grade 1 anterolisthesis L4-5. Electronically Signed   By: Lucrezia Europe M.D.   On: 06/12/2021 16:59   DG Pelvis 1-2 Views  Result Date: 06/15/2021 CLINICAL DATA:  [place], weakness EXAM: PELVIS - 1-2 VIEW COMPARISON:  08/03/2011 FINDINGS: 2 frontal views of the pelvis and bilateral hips are obtained. Right hip arthroplasty is identified, with no evidence of acute complication. Mild left hip osteoarthritis. No acute displaced fracture, subluxation, or dislocation. Abandoned fragmented stimulator wire overlies the right sacral ala. Nerve stimulator and contiguous lead within the left lower abdomen and pelvis. Soft tissues are unremarkable. IMPRESSION: 1. No acute displaced fracture. Electronically Signed   By: Randa Ngo M.D.   On: 06/10/2021 17:05   CT CHEST W CONTRAST  Result Date: 05/19/2021 CLINICAL DATA:  Pneumonia, respiratory illness, abnormal chest x-ray EXAM: CT CHEST WITH CONTRAST TECHNIQUE: Multidetector CT imaging of the chest was performed during intravenous contrast administration. RADIATION DOSE REDUCTION: This exam was performed according to the departmental dose-optimization program which includes automated exposure control, adjustment of the mA and/or kV according to patient size and/or use of iterative reconstruction technique. CONTRAST:  97mL OMNIPAQUE IOHEXOL 300 MG/ML  SOLN COMPARISON:  07/15/2020 FINDINGS: Cardiovascular: Extensive multi-vessel coronary artery calcification. Global cardiac size within normal limits. Left subclavian pacemaker in place with leads within the right heart and left ventricular venous outflow. No pericardial effusion. The central pulmonary arteries are enlarged in keeping with changes of pulmonary arterial hypertension, stable since prior examination. Moderate atherosclerotic calcification noted within the thoracic aorta. No aortic aneurysm. 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 Right internal jugular chest port tip within the right atrium. Mediastinum/Nodes: The visualized thyroid is unremarkable. There is shotty right paratracheal and pre-vascular adenopathy without frankly pathologic enlargement. There is necrotic right hilar adenopathy best seen on image # 74/2. The esophagus is unremarkable. Lungs/Pleura: Right-sided volume loss with chronic collapse and traction bronchiectasis of the right middle lobe again noted. There has developed masslike consolidation within the anterior segment of the right upper lobe un wit vaults in pleural retraction.  This involves an area of more focal consolidation on a prior examination and measures up to 2.5 x 2.6 cm in size on axial image # 63/5. There is associated partially loculated right pleural effusion noted anteriorly. Asymmetric bronchial wall thickening noted within the right upper lobe in keeping with airway inflammation. There is again noted diffuse bronchiectasis, airway impaction, and peripheral, scattered areas of subpleural reticulation and consolidation most suggestive of chronic atypical infection and/or infectious bronchiolitis. This is not well assessed due to respiratory motion artifact, but appears grossly stable since prior examination. No pneumothorax. No pleural effusion on the left. Upper Abdomen: No acute abnormality. Musculoskeletal: Lytic lesion has developed within the right seventh rib posterolaterally the left clavicle both medially and distally as well as throughout the thoracic spine best appreciated within T1. Several additional foci of periosteal reaction or noted within several right ribs suspicious for additional foci of metastatic disease. IMPRESSION: Interval development of masslike consolidation within the a right upper lobe in an area of more focal consolidation noted on prior examination suspicious for primary bronchogenic malignancy. Associated shotty right pre-vascular, and periaortic  adenopathy as well as necrotic right hilar adenopathy. Interval development of numerous lytic lesions throughout the visualized axial skeleton. PET CT examination may be helpful for further evaluation. Superimposed changes of a chronic atypical infection or infectious bronchiolitis with bronchiolectasis, airway impaction, and peripheral subpleural consolidation reticulation grossly stable since prior examination. Interval development of partially loculated right pleural effusion. Diagnostic thoracentesis may be helpful for further management. Extensive multi-vessel coronary artery calcification. Morphologic changes in keeping with pulmonary arterial hypertension. Aortic Atherosclerosis (ICD10-I70.0). Electronically Signed   By: Fidela Salisbury M.D.   On: 05/19/2021 00:59   XR C-ARM NO REPORT  Result Date: 06/10/2021 Please see Notes tab for imaging impression.   IMPRESSION:  *Acute on chronic anemia: Chronically follows for her anemia with hematology.  Presented with dizziness and weakness, symptomatic from drop in hemoglobin to 6.0 g.  Heme positive on exam.  Husband reports dark stools last week.  She received 2 units of packed red blood cells and hemoglobin is up to 8.9 g this morning.  Suspect upper GI bleed in the setting of Eliquis. *Dysphagia: Has been occurring over the past 3 weeks to both solids and liquids.  Initially was intermittent, now more regular/more frequent. *Atrial fibrillation on Eliquis at home: Last dose 2/27 AM because she had a back injection yesterday. *Chronic oxygen therapy, COPD/emphysema, chronic hypoxemic respiratory failure *Multifocal pneumonia:  On abx. *Chronic pain:  Complaining of a lot of back pain and asking for adjustment in her pain medication.  I sent a page to the hospitalist to make him aware.  PLAN: -EGD orders placed and was hoping for later today but endoscopy cannot accommodate.  Will likely have to plan for 3/4 with Dr. Collene Mares or Dr. Benson Norway.  If not today  then can have clear liquids and NPO after midnight.   -IV PPI gtt for now. -Monitor Hgb and transfuse further prn.  Laban Emperor. Corrion Stirewalt  05/19/2021, 9:19 AM

## 2021-05-19 NOTE — Consult Note (Signed)
WOC Nurse Consult Note: ?Reason for Consult:Unstageable pressure injury to sacrum ?Wound type:pressure ?Pressure Injury POA: Yes ?Measurement:5cm x 4cm pressure injury with 2cm x 3cm area of nonviable tissue in center (Black eschar surrounded by white slough) ?Wound bed:As described above ?Drainage (amount, consistency, odor) small amount of light yellow exudate due to autolysis noted on old dressing ?Periwound: intact. Patient is using an external urinary incontinence device with suction (PurWick) to manage UI. ?Dressing procedure/placement/frequency: I will provide the patient with a mattress replacement with low air loss feature today and provide Nursing with guidance to turn from side to side and minimize the supine position. Heels are to be floated. Topical care is with collagenase (Santyl) ointment applied once daily and PRN soiling. This is to be topped with a saline dampened gauze dressing, dry gauze and secured with the silicone foam. The "tip" of the silicone foam is to be oriented away from the anus. Nutrition will need to be maximized to enhance wound healing. ? ?[facility] nursing team will not follow, but will remain available to this patient, the nursing and medical teams.  Please re-consult if needed. ?Thanks, ?Maudie Flakes, MSN, RN, [place], [place], CWON-AP, [place]  ?Pager# [phone]  ? ? ? ?  ?

## 2021-05-19 NOTE — Progress Notes (Signed)
PROGRESS NOTE    [name]  BMW:[id number] DOB: 1945-11-21 DOA: 06/08/2021 PCP: Ann Held, DO   Brief Narrative: [name] is a 76 y.o. female with a history of atrial fibrillation/flutter on Eliquis, CAD, COPD, recent wrist fracture, s/p PPM, MAI infection, chronic pain, bronchiectasis, chronic CHF. Patient presented secondary to worsening back pain and weakness but found to also have a history of melena and concern for upper GI bleeding. FOBT was positive in the ED. GI consulted and patient given 2 units of PRBC for a hemoglobin of 6 with symptoms. Incidentally, concern for multifocal pneumonia on chest x-ray. CT chest concerning for RUL consolidation concerning for mass. Pulmonology consulted.   Assessment and Plan: * Symptomatic anemia Secondary to GI bleeding as evidenced by melena and guaiac positive stool test. [facility] consulted on admission. Patient started empirically on Protonix IV drip. Hemoglobin of 6 on admission, down from baseline of about 10. 2 units of PRBC ordered by EDP with rebound hemoglobin of 8.9. -Daily CBC -Follow-up GI recommendations for management -NPO pending GI recommendations for management -Continue Protonix IV drip  Right upper lobe consolidation ([place]) Seen on CT scan, concerning for malignancy, but in setting of MAI. Also noted was a small loculated effusion. -Pulmonology consult  Pressure injury of skin Likely related to recent prolonged immobility. Located on medial sacrum. -Wound consult  Recurrent falls Unsure of etiology. Possibly related to anemia. Patient is also on gabapentin with low renal function. Back pain is also a barrier to successful mobility -PT/OT eval  Multifocal pneumonia Recurrent issues with pneumonia. Patient with a history of MAI not currently on treatment (completed treatment in 2022). Last seen by ID in July 2022. Negative influenza/COVID. Started empirically on Ceftriaxone and azithromycin.  Recently complete a 7 day treatment course of Levaquin for pneumonia. History of MAI and bronchiectasis makes this more complicated. Symptoms, per patient, are at baseline. RVP negative. MRSA PCR negative. -Continue Ceftriaxone/azithromycin for now -Sputum culture, urine strep/legionella -Procalcitonin; if negative, may consider discontinuing antibiotics  Pulmonary MAI (mycobacterium avium-intracellulare) infection ([place]) See problem, Multifocal pneumonia  Hyperlipidemia LDL goal <100 -Continue Lipitor  Chronic diastolic CHF (congestive heart failure) (HCC) Stable. Patient is euvolemic. She is on Lasix as an outpatient. -Daily weights/strict in and out  Bronchiectasis ([place]) Chronic issue. Patient follows with pulmonology.  Insomnia -Continue Ambien qhs PRN  Anemia, chronic disease Patient follows with hematology/oncology, Dr. Marin Olp. Right port placed in upper chest. Patient receives intermittent blood transfusions. Baseline hemoglobin of about 10.  Chronic hypoxemic respiratory failure (HCC) -Continue home oxygen at 4 L/min  Fibromyalgia See problem, Chronic pain syndrome  COPD with emphysema (HCC) -Continue Incruse Ellipta and albuterol  Atrial fibrillation/flutter Currently seems to be in sinus rhythm with increased rate. Patient is on Eliquis for stroke prevention. She is not on rate or rhythm control medication. -Telemetry while having acute GI bleeding  Essential hypertension Currently mostly normotensive. Patient is on amlodipine as an outpatient. -Hold amlodipine for now in setting of acute GI bleeding  Chronic pain syndrome -Continue home Percocet and gabapentin (decrease dose for renal function)    DVT prophylaxis: SCDs Code Status:   Code Status: Full Code Family Communication: None at bedside Disposition Plan: Discharge home likely in 3-5 days pending GI recommendations/management, stable hemoglobin, improvement of GI bleeding and pulmonology  recommendations   Consultants:   GI Pulmonology  Procedures:  None  Antimicrobials: Ceftriaxone Azithromycin    Subjective: Significant back pain this morning. Otherwise feeling  okay. Cough is stable. No dyspnea.  Objective: BP (S) (!) 142/84 (BP Location: Left Arm)    Pulse (S) (!) 105    Temp (S) 99.9 F (37.7 C) (Oral) Comment: post infusion vitals   Resp (S) 14    Ht 5' (1.524 m)    Wt 43.4 kg    SpO2 98%    BMI 18.69 kg/m   Examination:  General exam: Appears calm and comfortable Respiratory system: Clear to anterior auscultation. Respiratory effort normal. Cardiovascular system: S1 & S2 heard, RRR Gastrointestinal system: Abdomen is nondistended, soft and nontender. No organomegaly or masses felt. Normal bowel sounds heard. Central nervous system: Alert and oriented. No focal neurological deficits. Musculoskeletal: No edema. No calf tenderness Skin: No cyanosis. No rashes Psychiatry: Judgement and insight appear normal. Mood & affect appropriate.    Data Reviewed: I have personally reviewed following labs and imaging studies  CBC Lab Results  Component Value Date   WBC 10.0 05/19/2021   RBC 3.19 (L) 05/19/2021   HGB 8.9 (L) 05/19/2021   HCT 27.7 (L) 05/19/2021   MCV 86.8 05/19/2021   MCH 27.9 05/19/2021   PLT 276 05/19/2021   MCHC 32.1 05/19/2021   RDW 18.3 (H) 05/19/2021   LYMPHSABS 0.4 (L) 06/16/2021   MONOABS 0.9 05/31/2021   EOSABS 0.0 06/11/2021   BASOSABS 0.0 70/26/3785     Last metabolic panel Lab Results  Component Value Date   NA 139 05/19/2021   K 4.6 05/19/2021   CL 101 05/19/2021   CO2 27 05/19/2021   BUN 20 05/19/2021   CREATININE 0.62 05/19/2021   GLUCOSE 99 05/19/2021   GFRNONAA >60 05/19/2021   GFRAA >60 12/01/2019   CALCIUM 8.4 (L) 05/19/2021   PHOS 3.0 05/17/2014   PROT 6.7 06/06/2021   ALBUMIN 2.9 (L) 05/24/2021   BILITOT 1.2 05/24/2021   ALKPHOS 115 06/11/2021   AST 52 (H) 06/02/2021   ALT 12 06/16/2021    ANIONGAP 11 05/19/2021    GFR: Estimated Creatinine Clearance: 41 mL/min (by C-G formula based on SCr of 0.62 mg/dL).  Recent Results (from the past 240 hour(s))  Resp Panel by RT-PCR (Flu A&B, Covid) Nasopharyngeal Swab     Status: None   Collection Time: 05/21/2021  4:05 PM   Specimen: Nasopharyngeal Swab; Nasopharyngeal(NP) swabs in vial transport medium  Result Value Ref Range Status   SARS Coronavirus 2 by RT PCR NEGATIVE NEGATIVE Final    Comment: (NOTE) SARS-CoV-2 target nucleic acids are NOT DETECTED.  The SARS-CoV-2 RNA is generally detectable in upper respiratory specimens during the acute phase of infection. The lowest concentration of SARS-CoV-2 viral copies this assay can detect is 138 copies/mL. A negative result does not preclude SARS-Cov-2 infection and should not be used as the sole basis for treatment or other patient management decisions. A negative result may occur with  improper specimen collection/handling, submission of specimen other than nasopharyngeal swab, presence of viral mutation(s) within the areas targeted by this assay, and inadequate number of viral copies(<138 copies/mL). A negative result must be combined with clinical observations, patient history, and epidemiological information. The expected result is Negative.  Fact Sheet for Patients:  EntrepreneurPulse.com.au  Fact Sheet for Healthcare Providers:  IncredibleEmployment.be  This test is no t yet approved or cleared by the [place] FDA and  has been authorized for detection and/or diagnosis of SARS-CoV-2 by FDA under an Emergency Use Authorization (EUA). This EUA will remain  in effect (meaning this test can be used)  for the duration of the COVID-19 declaration under Section 564(b)(1) of the Act, 21 U.S.C.section 360bbb-3(b)(1), unless the authorization is terminated  or revoked sooner.       Influenza A by PCR NEGATIVE NEGATIVE Final   Influenza  B by PCR NEGATIVE NEGATIVE Final    Comment: (NOTE) The Xpert Xpress SARS-CoV-2/FLU/RSV plus assay is intended as an aid in the diagnosis of influenza from Nasopharyngeal swab specimens and should not be used as a sole basis for treatment. Nasal washings and aspirates are unacceptable for Xpert Xpress SARS-CoV-2/FLU/RSV testing.  Fact Sheet for Patients: EntrepreneurPulse.com.au  Fact Sheet for Healthcare Providers: IncredibleEmployment.be  This test is not yet approved or cleared by the [place] FDA and has been authorized for detection and/or diagnosis of SARS-CoV-2 by FDA under an Emergency Use Authorization (EUA). This EUA will remain in effect (meaning this test can be used) for the duration of the COVID-19 declaration under Section 564(b)(1) of the Act, 21 U.S.C. section 360bbb-3(b)(1), unless the authorization is terminated or revoked.  Performed at [facility], [place] [address] [place], [place] [address]   Respiratory (~20 pathogens) panel by PCR     Status: None   Collection Time: 05/31/2021  4:05 PM   Specimen: Nasopharyngeal Swab; Respiratory  Result Value Ref Range Status   Adenovirus NOT DETECTED NOT DETECTED Final   Coronavirus 229E NOT DETECTED NOT DETECTED Final    Comment: (NOTE) The Coronavirus on the Respiratory Panel, DOES NOT test for the novel  Coronavirus (2019 nCoV)    Coronavirus HKU1 NOT DETECTED NOT DETECTED Final   Coronavirus NL63 NOT DETECTED NOT DETECTED Final   Coronavirus OC43 NOT DETECTED NOT DETECTED Final   Metapneumovirus NOT DETECTED NOT DETECTED Final   Rhinovirus / Enterovirus NOT DETECTED NOT DETECTED Final   Influenza A NOT DETECTED NOT DETECTED Final   Influenza B NOT DETECTED NOT DETECTED Final   Parainfluenza Virus 1 NOT DETECTED NOT DETECTED Final   Parainfluenza Virus 2 NOT DETECTED NOT DETECTED Final   Parainfluenza Virus 3 NOT DETECTED NOT DETECTED Final    Parainfluenza Virus 4 NOT DETECTED NOT DETECTED Final   Respiratory Syncytial Virus NOT DETECTED NOT DETECTED Final   Bordetella pertussis NOT DETECTED NOT DETECTED Final   Bordetella Parapertussis NOT DETECTED NOT DETECTED Final   Chlamydophila pneumoniae NOT DETECTED NOT DETECTED Final   Mycoplasma pneumoniae NOT DETECTED NOT DETECTED Final    Comment: Performed at [facility], [address], [place], [place] [address]  Blood culture (routine x 2)     Status: None (Preliminary result)   Collection Time: 05/29/2021  5:38 PM   Specimen: BLOOD  Result Value Ref Range Status   Specimen Description   Final    BLOOD PORTA CATH Performed at [facility] [address] [place], [place] [address]    Special Requests   Final    BOTTLES DRAWN AEROBIC AND ANAEROBIC Blood Culture adequate volume Performed at [facility] [address], [place], [place] [address]    Culture   Final    NO GROWTH < 12 HOURS Performed at [facility] [address] [place], [place] [address]    Report Status PENDING  Incomplete  Blood culture (routine x 2)     Status: None (Preliminary result)   Collection Time: 06/12/2021  6:28 PM   Specimen: BLOOD  Result Value Ref Range Status   Specimen Description   Final    BLOOD  SPECIMEN SOURCE NOT MARKED ON REQUISITION Performed at [facility], [place] [address] [place], [place] [address]    Special Requests   Final    BOTTLES DRAWN AEROBIC ONLY Blood Culture adequate volume Performed at [facility] [address] [place], [place] [address]    Culture   Final    NO GROWTH < 12 HOURS Performed at [facility] [address], [place], [place] [address]    Report Status PENDING  Incomplete  MRSA Next Gen by PCR, Nasal     Status: None   Collection Time: 06/02/2021 11:38 PM   Specimen: Nasal Mucosa; Nasal Swab  Result Value Ref Range Status   MRSA by PCR Next Gen NOT  DETECTED NOT DETECTED Final    Comment: (NOTE) The GeneXpert MRSA Assay (FDA approved for NASAL specimens only), is one component of a comprehensive MRSA colonization surveillance program. It is not intended to diagnose MRSA infection nor to guide or monitor treatment for MRSA infections. Test performance is not FDA approved in patients less than 75 years old. Performed at [facility], [place] [address], [place],  [address]       Radiology Studies: DG Chest 1 View  Result Date: 05/19/2021 CLINICAL DATA:  [place], dehydration EXAM: CHEST  1 VIEW COMPARISON:  04/26/2021 FINDINGS: Single frontal view of the chest demonstrates right chest wall port unchanged. Multi lead pacer is stable. Cardiac silhouette is unremarkable. Slight progression of the multifocal right-sided pneumonia seen previously. Trace right pleural effusion unchanged. Slight increased interstitial and ground-glass opacity within the left upper lobe. Right apical pleural thickening is unchanged since 04/10/2021, but has noticeably progressed since the 07/15/2020 CT and 02/03/2020 x-ray. No acute bony abnormalities. IMPRESSION: 1. Progressive multifocal bilateral airspace disease as above, consistent with worsening pneumonia. 2. Asymmetric right apical pleural thickening, which has progressed since the 11/05/2020 chest CT. 3. Given the persistent airspace disease and prominent right apical pleural thickening, follow-up chest CT with IV contrast may be useful to exclude underlying neoplasm. Electronically Signed   By: Randa Ngo M.D.   On: 06/12/2021 17:09   DG Lumbar Spine Complete  Result Date: 06/15/2021 CLINICAL DATA:  Dehydration, previous fall EXAM: LUMBAR SPINE - COMPLETE 4+ VIEW COMPARISON:  04/24/2021 FINDINGS: Negative for fracture. Moderate narrowing of the interspace L4-5 with stable grade 1 anterolisthesis. Implanted sacral stimulator device. Right hip arthroplasty components partially visualized.  Cholecystectomy clips. Aortic Atherosclerosis (ICD10-170.0). IMPRESSION: No acute findings.  Stable grade 1 anterolisthesis L4-5. Electronically Signed   By: Lucrezia Europe M.D.   On: 06/13/2021 16:59   DG Pelvis 1-2 Views  Result Date: 05/27/2021 CLINICAL DATA:  [place], weakness EXAM: PELVIS - 1-2 VIEW COMPARISON:  08/03/2011 FINDINGS: 2 frontal views of the pelvis and bilateral hips are obtained. Right hip arthroplasty is identified, with no evidence of acute complication. Mild left hip osteoarthritis. No acute displaced fracture, subluxation, or dislocation. Abandoned fragmented stimulator wire overlies the right sacral ala. Nerve stimulator and contiguous lead within the left lower abdomen and pelvis. Soft tissues are unremarkable. IMPRESSION: 1. No acute displaced fracture. Electronically Signed   By: Randa Ngo M.D.   On: 05/17/2021 17:05   CT CHEST W CONTRAST  Result Date: 05/19/2021 CLINICAL DATA:  Pneumonia, respiratory illness, abnormal chest x-ray EXAM: CT CHEST WITH CONTRAST TECHNIQUE: Multidetector CT imaging of the chest was performed during intravenous contrast administration. RADIATION DOSE REDUCTION: This exam was performed according to the departmental dose-optimization program which  includes automated exposure control, adjustment of the mA and/or kV according to patient size and/or use of iterative reconstruction technique. CONTRAST:  66mL OMNIPAQUE IOHEXOL 300 MG/ML  SOLN COMPARISON:  07/15/2020 FINDINGS: Cardiovascular: Extensive multi-vessel coronary artery calcification. Global cardiac size within normal limits. Left subclavian pacemaker in place with leads within the right heart and left ventricular venous outflow. No pericardial effusion. The central pulmonary arteries are enlarged in keeping with changes of pulmonary arterial hypertension, stable since prior examination. Moderate atherosclerotic calcification noted within the thoracic aorta. No aortic aneurysm. 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 Right internal jugular chest port tip within the right atrium. Mediastinum/Nodes: The visualized thyroid is unremarkable. There is shotty right paratracheal and pre-vascular adenopathy without frankly pathologic enlargement. There is necrotic right hilar adenopathy best seen on image # 74/2. The esophagus is unremarkable. Lungs/Pleura: Right-sided volume loss with chronic collapse and traction bronchiectasis of the right middle lobe again noted. There has developed masslike consolidation within the anterior segment of the right upper lobe un wit vaults in pleural retraction. This involves an area of more focal consolidation on a prior examination and measures up to 2.5 x 2.6 cm in size on axial image # 63/5. There is associated partially loculated right pleural effusion noted anteriorly. Asymmetric bronchial wall thickening noted within the right upper lobe in keeping with airway inflammation. There is again noted diffuse bronchiectasis, airway impaction, and peripheral, scattered areas of subpleural reticulation and consolidation most suggestive of chronic atypical infection and/or infectious bronchiolitis. This is not well assessed due to respiratory motion artifact, but appears grossly stable since prior examination. No pneumothorax. No pleural effusion on the left. Upper Abdomen: No acute abnormality. Musculoskeletal: Lytic lesion has developed within the right seventh rib posterolaterally the left clavicle both medially and distally as well as throughout the thoracic spine best appreciated within T1. Several additional foci of periosteal reaction or noted within several right ribs suspicious for additional foci of metastatic disease. IMPRESSION: Interval development of masslike consolidation within the a right upper lobe in an area of more focal consolidation noted on prior examination suspicious for primary bronchogenic malignancy. Associated shotty right pre-vascular, and periaortic  adenopathy as well as necrotic right hilar adenopathy. Interval development of numerous lytic lesions throughout the visualized axial skeleton. PET CT examination may be helpful for further evaluation. Superimposed changes of a chronic atypical infection or infectious bronchiolitis with bronchiolectasis, airway impaction, and peripheral subpleural consolidation reticulation grossly stable since prior examination. Interval development of partially loculated right pleural effusion. Diagnostic thoracentesis may be helpful for further management. Extensive multi-vessel coronary artery calcification. Morphologic changes in keeping with pulmonary arterial hypertension. Aortic Atherosclerosis (ICD10-I70.0). Electronically Signed   By: Fidela Salisbury M.D.   On: 05/19/2021 00:59   XR C-ARM NO REPORT  Result Date: 06/11/2021 Please see Notes tab for imaging impression.     LOS: 0 days    Cordelia Poche, MD [facility] 05/19/2021, 9:41 AM   If 7PM-7AM, please contact night-coverage [email or web address]

## 2021-05-19 NOTE — Evaluation (Signed)
Physical Therapy Evaluation ?Patient Details ?Name: [name] ?MRN: [id number] ?DOB: 1945/10/16 ?Today's Date: 05/19/2021 ? ?History of Present Illness ? Patient is a 76 year old female admitted on 05/19/2021 with worsening back pain and weakness as well as anemia hemoglobin of 6 (has received PRBC) and PNE. CT chest concerning for RUL consolidation concerning for mass. PMH:  atrial fibrillation/flutter on Eliquis, CAD, COPD, recent wrist fracture, s/p PPM, MAI infection, chronic pain, bronchiectasis, chronic CHF  ?Clinical Impression ? Pt admitted with above diagnosis. At baseline, pt is independent - reports weaker over the past 2 weeks and fall 3 weeks ago with wrist fx.  Today, pt limited by dizziness and nausea in sitting (BP was elevated).  She sat EOB 5 mins and unable to progress to standing.  Pt currently with functional limitations due to the deficits listed below (see PT Problem List). Pt will benefit from skilled PT to increase their independence and safety with mobility to allow discharge to the venue listed below.   ?   ?   ? ?Recommendations for follow up therapy are one component of a multi-disciplinary discharge planning process, led by the attending physician.  Recommendations may be updated based on patient status, additional functional criteria and insurance authorization. ? ?Follow Up Recommendations Skilled nursing-short term rehab (<3 hours/day) ? ?  ?Assistance Recommended at Discharge Frequent or constant Supervision/Assistance  ?Patient can return home with the following ? A lot of help with bathing/dressing/bathroom;A lot of help with walking and/or transfers;Assistance with cooking/housework ? ?  ?Equipment Recommendations Other (comment) (needs further assessment)  ?Recommendations for Other Services ?    ?  ?Functional Status Assessment Patient has had a recent decline in their functional status and demonstrates the ability to make significant improvements in function in a reasonable and  predictable amount of time.  ? ?  ?Precautions / Restrictions Precautions ?Precautions: Fall  ? ?  ? ?Mobility ? Bed Mobility ?Overal bed mobility: Needs Assistance ?Bed Mobility: Sidelying to Sit, Sit to Sidelying ?  ?Sidelying to sit: Mod assist, +2 for safety/equipment ?  ?  ?Sit to sidelying: Mod assist, +2 for safety/equipment ?General bed mobility comments: Cues for all aspects, assist with legs and trunk ?  ? ?Transfers ?  ?  ?  ?  ?  ?  ?  ?  ?  ?General transfer comment: unable ?  ? ?Ambulation/Gait ?  ?  ?  ?  ?  ?  ?  ?  ? ?Stairs ?  ?  ?  ?  ?  ? ?Wheelchair Mobility ?  ? ?Modified Rankin (Stroke Patients Only) ?  ? ?  ? ?Balance Overall balance assessment: Needs assistance, History of Falls ?Sitting-balance support: Feet supported, No upper extremity supported ?Sitting balance-Leahy Scale: Fair ?  ?  ?  ?  ?Standing balance comment: unable - nausea/dizzy/weak ?  ?  ?  ?  ?  ?  ?  ?  ?  ?  ?  ?   ? ? ? ?Pertinent Vitals/Pain Pain Assessment ?Pain Assessment: No/denies pain  ? ? ?Home Living Family/patient expects to be discharged to:: Unsure ?Living Arrangements: Spouse/significant other ?Available Help at Discharge: Family;Available 24 hours/day ?Type of Home: House ?Home Access: Stairs to enter ?Entrance Stairs-Rails: None ?Entrance Stairs-Number of Steps: 2 ?  ?Home Layout: One level ?Home Equipment: Conservation officer, nature (2 wheels);Cane - single point ?   ?  ?Prior Function   ?  ?  ?  ?  ?  ?  ?  Mobility Comments: Could ambulate without AD ?ADLs Comments: Independent with ADLs; some assist with IADLs ?  ? ? ?Hand Dominance  ?   ? ?  ?Extremity/Trunk Assessment  ? Upper Extremity Assessment ?Upper Extremity Assessment: Defer to OT evaluation ?RUE Deficits / Details: recent wrist fracture in hard cast ?  ? ?Lower Extremity Assessment ?Lower Extremity Assessment:  (Bil LE ROM WFL; MMT at least 3/5 but pt not feeling like further testing) ?  ? ?Cervical / Trunk Assessment ?Cervical / Trunk Assessment: Kyphotic   ?Communication  ? Communication: No difficulties  ?Cognition Arousal/Alertness: Awake/alert ?Behavior During Therapy: [facility] for tasks assessed/performed ?Overall Cognitive Status: Within Functional Limits for tasks assessed ?  ?  ?  ?  ?  ?  ?  ?  ?  ?  ?  ?  ?  ?  ?  ?  ?General Comments: Asleep but easily arousable; oriented; now reports broke her wrist 3 weeks ago when opening son's door ?  ?  ? ?  ?General Comments General comments (skin integrity, edema, etc.): Pt reports weak, dizzy, and nauseated sitting.  Also, reports fear of falling.  Unable to progress to standing. BP was 167/95 (notified RN) ? ?  ?Exercises    ? ?Assessment/Plan  ?  ?PT Assessment Patient needs continued PT services  ?PT Problem List Decreased strength;Decreased mobility;Decreased coordination;Decreased activity tolerance;Cardiopulmonary status limiting activity;Decreased balance;Decreased knowledge of use of DME ? ?   ?  ?PT Treatment Interventions DME instruction;Therapeutic activities;Gait training;Therapeutic exercise;Patient/family education;Stair training;Balance training;Functional mobility training   ? ?PT Goals (Current goals can be found in the Care Plan section)  ?Acute Rehab PT Goals ?Patient Stated Goal: get stronger ?PT Goal Formulation: With patient ?Time For Goal Achievement: 06/02/21 ?Potential to Achieve Goals: Fair ? ?  ?Frequency Min 2X/week ?  ? ? ?Co-evaluation   ?  ?  ?  ?  ? ? ?  ?AM-PAC PT "6 Clicks" Mobility  ?Outcome Measure Help needed turning from your back to your side while in a flat bed without using bedrails?: A Little ?Help needed moving from lying on your back to sitting on the side of a flat bed without using bedrails?: A Lot ?Help needed moving to and from a bed to a chair (including a wheelchair)?: Total ?Help needed standing up from a chair using your arms (e.g., wheelchair or bedside chair)?: Total ?Help needed to walk in hospital room?: Total ?Help needed climbing 3-5 steps with a railing? :  Total ?6 Click Score: 9 ? ?  ?End of Session Equipment Utilized During Treatment: Gait belt ?Activity Tolerance: Other (comment) (limited by nausea/weakness/dizziness) ?Patient left: in bed;with call bell/phone within reach;with bed alarm set ?Nurse Communication: Mobility status ?PT Visit Diagnosis: Other abnormalities of gait and mobility (R26.89);Muscle weakness (generalized) (M62.81) ?  ? ?Time: 0102-7253 ?PT Time Calculation (min) (ACUTE ONLY): 20 min ? ? ?Charges:   PT Evaluation ?$PT Eval Low Complexity: 1 Low ?  ?  ?   ? ? ?Abran Richard, PT ?Acute Rehab Services ?Pager [phone] ?[facility] [phone] ? ? ?Mikael Spray Chemeka Filice ?05/19/2021, 4:40 PM ? ?

## 2021-05-19 NOTE — Assessment & Plan Note (Addendum)
Seen on CT scan, concerning for malignancy, but in setting of MAI. Also noted was a small loculated effusion. Pulmonology consulted with recommendation for outpatient PET CT in 2 weeks and follow-up with pulmonology. Recommendation for no aspiration of right-sided loculated effusion at this time. ?

## 2021-05-19 NOTE — Hospital Course (Addendum)
[name] is a 76 y.o. F with permAF on Eliquis, COPD/bronchiectasis/MAI and chronic respiratory failure on 4L at home, CAD, hx PPM, chronic pain and dCHF who presented with weakness and back pain.  Also found incidentally to report melena and have FOBT in the ER.     3/2: Admitted, started on PPI given melena, transfused 2 units; given antibiotics given multifocal PNA on CXR 3/3: GI consulted for melena; CT chest showed RUL mass, loculated effusion and lytic lesions scattered in bone --> Pulmonology consulted 3/4: EGD significant for gastritis only  3/7: Colonoscopy showed a subtle mucosal irregularity at her anastomosis, biopsied, but no definite source of bleeding 3/8: Biopsy from colonoscopy resulted adenoCA 3/9: Gen Surg, Palliative Care and Oncology consulted 3/10: Bronchoscopic biopsy of mediastinal lymph node by Pulmonology 3/11: More tachycardia overnight to 120s, 130s, very weak today, continue limited scope care but code status changed to DNR 3/12: Worsening again, mentation worse 3/13: Weaker, mentation poor

## 2021-05-19 NOTE — Evaluation (Signed)
Occupational Therapy Evaluation ?Patient Details ?Name: ANWYN KRIEGEL ?MRN: [id number] ?DOB: [birth year] ?Today's Date: 05/19/2021 ? ? ?History of Present Illness Patient is a 76 year old female admitted with worsening back pain and weakness as well as anemia hemoglobin of 6. CT chest concerning for RUL consolidation concerning for mass. PMH:  atrial fibrillation/flutter on Eliquis, CAD, COPD, recent wrist fracture, s/p PPM, MAI infection, chronic pain, bronchiectasis, chronic CHF  ? ?Clinical Impression ?  ?Patient lives at home with spouse per chart review. Patient disoriented to month/year and groggy likely from sleeping medication she was given the night previous. When asked how she broke her wrist patient states she fell when trying to wake her son up for school. Overall patient needing mod A for bed mobility and two attempts of sit to stand at edge of bed. Patient with flexed posture and difficulty maintaining balance, unable to weight shift or take side steps to head of bed. Patient also reporting back pain. At this time patient needing min to mod A for upper body ADLs and total A for lower body ADLs. Unsure of assist levels available at home however at this time would recommend short term rehab at [facility]. Acute OT to follow.   ?   ? ?Recommendations for follow up therapy are one component of a multi-disciplinary discharge planning process, led by the attending physician.  Recommendations may be updated based on patient status, additional functional criteria and insurance authorization.  ? ?Follow Up Recommendations ? Skilled nursing-short term rehab (<3 hours/day)  ?  ?Assistance Recommended at Discharge Frequent or constant Supervision/Assistance  ?Patient can return home with the following A lot of help with walking and/or transfers;A lot of help with bathing/dressing/bathroom;Assistance with cooking/housework;Direct supervision/assist for medications management;Direct supervision/assist for financial  management;Assist for transportation;Help with stairs or ramp for entrance ? ?  ?Functional Status Assessment ? Patient has had a recent decline in their functional status and demonstrates the ability to make significant improvements in function in a reasonable and predictable amount of time.  ?Equipment Recommendations ? Other (comment) (unsure of home DME)  ?  ?   ?Precautions / Restrictions Precautions ?Precautions: Fall ?Restrictions ?Weight Bearing Restrictions: No  ? ?  ? ?Mobility Bed Mobility ?Overal bed mobility: Needs Assistance ?Bed Mobility: Sidelying to Sit, Sit to Sidelying ?  ?Sidelying to sit: Mod assist, HOB elevated ?  ?  ?Sit to sidelying: Mod assist, HOB elevated ?General bed mobility comments: Patient able to bring legs towards edge of bed, mod A to upright trunk to sitting. Patient needing assist to lift legs back onto the bed ?  ? ? ? ?  ?Balance Overall balance assessment: Needs assistance, History of Falls ?Sitting-balance support: Feet supported ?Sitting balance-Leahy Scale: Fair ?  ?  ?Standing balance support: Single extremity supported ?Standing balance-Leahy Scale: Poor ?Standing balance comment: mod A HHA, unable to maintain upright posture ?  ?  ?  ?  ?  ?  ?  ?  ?  ?  ?  ?   ? ?ADL either performed or assessed with clinical judgement  ? ?ADL Overall ADL's : Needs assistance/impaired ?Eating/Feeding: NPO ?  ?Grooming: Wash/dry face;Minimal assistance;Bed level ?Grooming Details (indicate cue type and reason): For thoroughness, pt groggy ?Upper Body Bathing: Moderate assistance;Sitting ?  ?Lower Body Bathing: Total assistance;Sitting/lateral leans ?  ?Upper Body Dressing : Moderate assistance;Sitting ?  ?Lower Body Dressing: Total assistance;Sitting/lateral leans;Sit to/from stand ?  ?Toilet Transfer: Moderate assistance ?Toilet Transfer Details (indicate cue type and  reason): Patient needing mod A to power up to standing from edge of bed. Flexed posture with hand held assist +1 unable  to upright posture with cues. Attempted this twice at edge of bed and patient unable to side step. ?Toileting- Water quality scientist and Hygiene: Total assistance;Sitting/lateral lean;Bed level ?  ?  ?  ?Functional mobility during ADLs: Moderate assistance ?General ADL Comments: Patient needing significant assistance with self care tasks due to fatigue/grogginess, back pain, poor standing balance and activity tolerance  ? ? ? ? ?Pertinent Vitals/Pain Pain Assessment ?Pain Assessment: 0-10 ?Pain Score: 8  ?Pain Location: Back ?Pain Descriptors / Indicators: Aching, Moaning, Grimacing ?Pain Intervention(s): Monitored during session, Limited activity within patient's tolerance  ? ? ? ?Hand Dominance  (Did not specify) ?  ?Extremity/Trunk Assessment Upper Extremity Assessment ?Upper Extremity Assessment: RUE deficits/detail ?RUE Deficits / Details: recent wrist fracture in hard cast ?  ?Lower Extremity Assessment ?Lower Extremity Assessment: Defer to PT evaluation ?  ?  ?  ?Communication Communication ?Communication: No difficulties ?  ?Cognition Arousal/Alertness: Lethargic, Suspect due to medications (received sleeping medication last night) ?Behavior During Therapy: [facility] for tasks assessed/performed ?Overall Cognitive Status: No family/caregiver present to determine baseline cognitive functioning ?  ?  ?  ?  ?  ?  ?  ?  ?  ?  ?  ?  ?  ?  ?  ?  ?General Comments: Patient unable to state month/year. When trying to explain how she broke her wrist states she was trying to wake her son for school ?  ?  ?   ?   ?   ? ? ?Home Living Family/patient expects to be discharged to:: Unsure ?  ?  ?  ?  ?  ?  ?  ?  ?  ?  ?  ?  ?  ?  ?  ?  ?Additional Comments: Per patient she lives with spouse and her children, unreliable narrator therefore unable to obtain further info ?  ? ?  ?Prior Functioning/Environment Prior Level of Function : Patient poor historian/Family not available ?  ?  ?  ?  ?  ?  ?  ?  ?  ? ?  ?  ?OT Problem List:  Decreased strength;Decreased activity tolerance;Impaired balance (sitting and/or standing);Decreased cognition;Decreased safety awareness;Pain;Impaired UE functional use ?  ?   ?OT Treatment/Interventions: Self-care/ADL training;Therapeutic exercise;Energy conservation;DME and/or AE instruction;Therapeutic activities;Patient/family education;Balance training  ?  ?OT Goals(Current goals can be found in the care plan section) Acute Rehab OT Goals ?Patient Stated Goal: "I haven't stood in 4 weeks" ?OT Goal Formulation: With patient ?Time For Goal Achievement: 06/02/21 ?Potential to Achieve Goals: Good  ?OT Frequency: Min 2X/week ?  ? ?   ?AM-PAC OT "6 Clicks" Daily Activity     ?Outcome Measure Help from another person eating meals?: Total (NPO) ?Help from another person taking care of personal grooming?: A Little ?Help from another person toileting, which includes using toliet, bedpan, or urinal?: A Lot ?Help from another person bathing (including washing, rinsing, drying)?: A Lot ?Help from another person to put on and taking off regular upper body clothing?: A Lot ?Help from another person to put on and taking off regular lower body clothing?: Total ?6 Click Score: 11 ?  ?End of Session Equipment Utilized During Treatment: Gait belt ? ?Activity Tolerance: Patient limited by lethargy;Patient limited by fatigue;Patient limited by pain ?Patient left: in bed;with call bell/phone within reach;with bed alarm set ? ?OT Visit Diagnosis: Unsteadiness on  feet (R26.81);Other abnormalities of gait and mobility (R26.89);History of falling (Z91.81);Muscle weakness (generalized) (M62.81)  ?              ?Time: 6578-4696 ?OT Time Calculation (min): 20 min ?Charges:  OT General Charges ?$OT Visit: 1 Visit ?OT Evaluation ?$OT Eval Low Complexity: 1 Low ? ?Delbert Phenix OT ?OT pager: [phone] ? ?Rosemary Holms ?05/19/2021, 10:36 AM ?

## 2021-05-20 ENCOUNTER — Inpatient Hospital Stay (HOSPITAL_COMMUNITY): Payer: HMO | Admitting: Anesthesiology

## 2021-05-20 ENCOUNTER — Encounter (HOSPITAL_COMMUNITY): Payer: Self-pay | Admitting: Family Medicine

## 2021-05-20 ENCOUNTER — Encounter (HOSPITAL_COMMUNITY): Admission: EM | Disposition: E | Payer: Self-pay | Source: Home / Self Care | Attending: Family Medicine

## 2021-05-20 DIAGNOSIS — K219 Gastro-esophageal reflux disease without esophagitis: Secondary | ICD-10-CM

## 2021-05-20 DIAGNOSIS — D638 Anemia in other chronic diseases classified elsewhere: Secondary | ICD-10-CM | POA: Diagnosis not present

## 2021-05-20 DIAGNOSIS — I5032 Chronic diastolic (congestive) heart failure: Secondary | ICD-10-CM | POA: Diagnosis not present

## 2021-05-20 DIAGNOSIS — D649 Anemia, unspecified: Secondary | ICD-10-CM | POA: Diagnosis not present

## 2021-05-20 DIAGNOSIS — K921 Melena: Secondary | ICD-10-CM

## 2021-05-20 DIAGNOSIS — I2510 Atherosclerotic heart disease of native coronary artery without angina pectoris: Secondary | ICD-10-CM

## 2021-05-20 DIAGNOSIS — I480 Paroxysmal atrial fibrillation: Secondary | ICD-10-CM | POA: Diagnosis not present

## 2021-05-20 DIAGNOSIS — D509 Iron deficiency anemia, unspecified: Secondary | ICD-10-CM

## 2021-05-20 HISTORY — PX: ESOPHAGOGASTRODUODENOSCOPY (EGD) WITH PROPOFOL: SHX5813

## 2021-05-20 LAB — CBC
HCT: 31.9 % — ABNORMAL LOW (ref 36.0–46.0)
Hemoglobin: 10.2 g/dL — ABNORMAL LOW (ref 12.0–15.0)
MCH: 28 pg (ref 26.0–34.0)
MCHC: 32 g/dL (ref 30.0–36.0)
MCV: 87.6 fL (ref 80.0–100.0)
Platelets: 255 10*3/uL (ref 150–400)
RBC: 3.64 MIL/uL — ABNORMAL LOW (ref 3.87–5.11)
RDW: 19.6 % — ABNORMAL HIGH (ref 11.5–15.5)
WBC: 16.3 10*3/uL — ABNORMAL HIGH (ref 4.0–10.5)
nRBC: 0.4 % — ABNORMAL HIGH (ref 0.0–0.2)

## 2021-05-20 SURGERY — ESOPHAGOGASTRODUODENOSCOPY (EGD) WITH PROPOFOL
Anesthesia: Monitor Anesthesia Care

## 2021-05-20 MED ORDER — LACTATED RINGERS IV SOLN: INTRAVENOUS | Status: DC | PRN

## 2021-05-20 MED ORDER — PROPOFOL 10 MG/ML IV BOLUS
INTRAVENOUS | Status: DC | PRN
  Administered 2021-05-20: 40 mg via INTRAVENOUS

## 2021-05-20 MED ORDER — LACTATED RINGERS IV SOLN
INTRAVENOUS | Status: AC | PRN
  Administered 2021-05-20: 20 mL/h via INTRAVENOUS

## 2021-05-20 MED ORDER — LIDOCAINE HCL (CARDIAC) PF 100 MG/5ML IV SOSY
PREFILLED_SYRINGE | INTRAVENOUS | Status: DC | PRN
  Administered 2021-05-20: 100 mg via INTRAVENOUS

## 2021-05-20 MED ORDER — PANTOPRAZOLE SODIUM 40 MG PO TBEC
40.0000 mg | DELAYED_RELEASE_TABLET | Freq: Every day | ORAL | Status: DC
  Administered 2021-05-20 – 2021-05-28 (×7): 40 mg via ORAL
  Filled 2021-05-20 (×7): qty 1

## 2021-05-20 MED ORDER — FUROSEMIDE 40 MG PO TABS
40.0000 mg | ORAL_TABLET | Freq: Every day | ORAL | Status: DC
  Administered 2021-05-20 – 2021-05-25 (×5): 40 mg via ORAL
  Filled 2021-05-20 (×5): qty 1

## 2021-05-20 MED ORDER — PROPOFOL 500 MG/50ML IV EMUL
INTRAVENOUS | Status: DC | PRN
  Administered 2021-05-20: 100 ug/kg/min via INTRAVENOUS

## 2021-05-20 SURGICAL SUPPLY — 15 items

## 2021-05-20 NOTE — Progress Notes (Signed)
PROGRESS NOTE    [name]  TJQ:[id number] DOB: [birth year] DOA: 06/03/2021 PCP: Ann Held, DO   Brief Narrative: [name] is a 76 y.o. female with a history of atrial fibrillation/flutter on Eliquis, CAD, COPD, recent wrist fracture, s/p PPM, MAI infection, chronic pain, bronchiectasis, chronic CHF. Patient presented secondary to worsening back pain and weakness but found to also have a history of melena and concern for upper GI bleeding. FOBT was positive in the ED. GI consulted and patient given 2 units of PRBC for a hemoglobin of 6 with symptoms. Incidentally, concern for multifocal pneumonia on chest x-ray. CT chest concerning for RUL consolidation concerning for mass. Pulmonology consulted with recommendation for outpatient PET CT and have transitioned the patient to Levaquin. Upper endoscopy significant for gastritis.   Assessment and Plan: * Symptomatic anemia Secondary to GI bleeding as evidenced by melena and guaiac positive stool test. [facility] consulted on admission. Patient started empirically on Protonix IV drip. Hemoglobin of 6 on admission, down from baseline of about 10. 2 units of PRBC ordered by EDP with rebound hemoglobin of 8.9 >10.2. Patient with continued melena. GI performed upper endoscopy on 3/4 which was significant for gastritis -Daily CBC -Follow-up GI recommendations: Continue protonix  Right upper lobe consolidation ([place]) Seen on CT scan, concerning for malignancy, but in setting of MAI. Also noted was a small loculated effusion. Pulmonology consulted with recommendation for outpatient PET CT in 2 weeks and follow-up with pulmonology. Recommendation for no aspiration of right-sided loculated effusion at this time.  Pressure injury of skin Likely related to recent prolonged immobility. Located on medial sacrum. Wound nurse recommendation (3/3): I will provide the patient with a mattress replacement with low air loss feature today and  provide Nursing with guidance to turn from side to side and minimize the supine position. Heels are to be floated. Topical care is with collagenase (Santyl) ointment applied once daily and PRN soiling. This is to be topped with a saline dampened gauze dressing, dry gauze and secured with the silicone foam. The "tip" of the silicone foam is to be oriented away from the anus. Nutrition will need to be maximized to enhance wound healing.  Recurrent falls Unsure of etiology. Possibly related to anemia. Patient is also on gabapentin with low renal function. Back pain is also a barrier to successful mobility. PT recommending SNF. -Consult TOC for SNF discharge  Multifocal pneumonia Recurrent issues with pneumonia. Patient with a history of MAI not currently on treatment (completed treatment in 2022). Last seen by ID in July 2022. Negative influenza/COVID. Started empirically on Ceftriaxone and azithromycin. Recently complete a 7 day treatment course of Levaquin for pneumonia. History of MAI and bronchiectasis makes this more complicated. Symptoms, per patient, are at baseline. RVP negative. MRSA PCR negative. Pulmonology transitioned to [place] with recommendation for 10 day course -Levaquin 500 mg daily -Sputum culture, urine strep/legionella  Pulmonary MAI (mycobacterium avium-intracellulare) infection ([place]) See problem, Multifocal pneumonia  Hyperlipidemia LDL goal <100 -Continue Lipitor  Chronic diastolic CHF (congestive heart failure) (HCC) Stable. Patient is euvolemic. She is on Lasix as an outpatient. -Daily weights/strict in and out -Resume home Lasix  Bronchiectasis ([place]) Chronic issue. Patient follows with pulmonology.  Insomnia -Continue Ambien qhs PRN  Anemia, chronic disease Patient follows with hematology/oncology, Dr. Marin Olp. Right port placed in upper chest. Patient receives intermittent blood transfusions. Baseline hemoglobin of about 10.  Chronic hypoxemic respiratory  failure (HCC) -Continue home oxygen at 4 L/min  Fibromyalgia See problem, Chronic pain syndrome  COPD with emphysema ([place]) -Continue Incruse Ellipta and albuterol  Atrial fibrillation/flutter Currently seems to be in sinus rhythm with increased rate. Patient is on Eliquis for stroke prevention. She is not on rate or rhythm control medication. -Telemetry while having acute GI bleeding  Essential hypertension Currently mostly normotensive. Patient is on amlodipine as an outpatient. -Hold amlodipine for now in setting of acute GI bleeding  Chronic pain syndrome -Continue home Percocet and gabapentin (decrease dose for renal function)    DVT prophylaxis: SCDs Code Status:   Code Status: Full Code Family Communication: None at bedside Disposition Plan: Discharge home likely in 3-5 days pending GI recommendations/management, stable hemoglobin, improvement of GI bleeding and pulmonology recommendations   Consultants:  [facility]  Procedures:  Upper endoscopy (3/4)  Antimicrobials: Ceftriaxone Azithromycin    Subjective: No significant issues overnight. Documentation of melenotic stool overnight.  Objective: BP 133/79    Pulse (!) 117    Temp 98.3 F (36.8 C)    Resp 14    Ht 5' (1.524 m)    Wt 41.7 kg    SpO2 99%    BMI 17.95 kg/m   Examination:  General exam: Appears calm and comfortable Respiratory system: Respiratory effort normal. Cardiovascular system: S1 & S2 heard, regular rhythm with tachycardia. Gastrointestinal system: Abdomen is nondistended, soft and nontender. No organomegaly or masses felt. Normal bowel sounds heard. Central nervous system: Alert and oriented. No focal neurological deficits. Musculoskeletal: No edema. No calf tenderness Skin: No cyanosis. No rashes Psychiatry: Judgement and insight appear normal. Mood & affect appropriate.    Data Reviewed: I have personally reviewed following labs and imaging studies  CBC Lab Results   Component Value Date   WBC 16.3 (H) 06/09/2021   RBC 3.64 (L) 05/19/2021   HGB 10.2 (L) 05/19/2021   HCT 31.9 (L) 05/21/2021   MCV 87.6 06/02/2021   MCH 28.0 05/29/2021   PLT 255 05/29/2021   MCHC 32.0 06/09/2021   RDW 19.6 (H) 05/24/2021   LYMPHSABS 0.4 (L) 05/17/2021   MONOABS 0.9 06/08/2021   EOSABS 0.0 05/28/2021   BASOSABS 0.0 88/41/6606     Last metabolic panel Lab Results  Component Value Date   NA 139 05/19/2021   K 4.6 05/19/2021   CL 101 05/19/2021   CO2 27 05/19/2021   BUN 20 05/19/2021   CREATININE 0.62 05/19/2021   GLUCOSE 99 05/19/2021   GFRNONAA >60 05/19/2021   GFRAA >60 12/01/2019   CALCIUM 8.4 (L) 05/19/2021   PHOS 3.0 05/17/2014   PROT 6.7 05/29/2021   ALBUMIN 2.9 (L) 06/08/2021   BILITOT 1.2 06/03/2021   ALKPHOS 115 05/25/2021   AST 52 (H) 05/22/2021   ALT 12 05/19/2021   ANIONGAP 11 05/19/2021    GFR: Estimated Creatinine Clearance: 39.4 mL/min (by C-G formula based on SCr of 0.62 mg/dL).  Recent Results (from the past 240 hour(s))  Resp Panel by RT-PCR (Flu A&B, Covid) Nasopharyngeal Swab     Status: None   Collection Time: 06/08/2021  4:05 PM   Specimen: Nasopharyngeal Swab; Nasopharyngeal(NP) swabs in vial transport medium  Result Value Ref Range Status   SARS Coronavirus 2 by RT PCR NEGATIVE NEGATIVE Final    Comment: (NOTE) SARS-CoV-2 target nucleic acids are NOT DETECTED.  The SARS-CoV-2 RNA is generally detectable in upper respiratory specimens during the acute phase of infection. The lowest concentration of SARS-CoV-2 viral copies this assay can detect is 138 copies/mL. A negative  result does not preclude SARS-Cov-2 infection and should not be used as the sole basis for treatment or other patient management decisions. A negative result may occur with  improper specimen collection/handling, submission of specimen other than nasopharyngeal swab, presence of viral mutation(s) within the areas targeted by this assay, and inadequate  number of viral copies(<138 copies/mL). A negative result must be combined with clinical observations, patient history, and epidemiological information. The expected result is Negative.  Fact Sheet for Patients:  EntrepreneurPulse.com.au  Fact Sheet for Healthcare Providers:  IncredibleEmployment.be  This test is no t yet approved or cleared by the [place] FDA and  has been authorized for detection and/or diagnosis of SARS-CoV-2 by FDA under an Emergency Use Authorization (EUA). This EUA will remain  in effect (meaning this test can be used) for the duration of the COVID-19 declaration under Section 564(b)(1) of the Act, 21 U.S.C.section 360bbb-3(b)(1), unless the authorization is terminated  or revoked sooner.       Influenza A by PCR NEGATIVE NEGATIVE Final   Influenza B by PCR NEGATIVE NEGATIVE Final    Comment: (NOTE) The Xpert Xpress SARS-CoV-2/FLU/RSV plus assay is intended as an aid in the diagnosis of influenza from Nasopharyngeal swab specimens and should not be used as a sole basis for treatment. Nasal washings and aspirates are unacceptable for Xpert Xpress SARS-CoV-2/FLU/RSV testing.  Fact Sheet for Patients: EntrepreneurPulse.com.au  Fact Sheet for Healthcare Providers: IncredibleEmployment.be  This test is not yet approved or cleared by the [place] FDA and has been authorized for detection and/or diagnosis of SARS-CoV-2 by FDA under an Emergency Use Authorization (EUA). This EUA will remain in effect (meaning this test can be used) for the duration of the COVID-19 declaration under Section 564(b)(1) of the Act, 21 U.S.C. section 360bbb-3(b)(1), unless the authorization is terminated or revoked.  Performed at [facility], [place] [address], [place], [place] [address]   Respiratory (~20 pathogens) panel by PCR     Status: None   Collection Time: 05/22/2021  4:05  PM   Specimen: Nasopharyngeal Swab; Respiratory  Result Value Ref Range Status   Adenovirus NOT DETECTED NOT DETECTED Final   Coronavirus 229E NOT DETECTED NOT DETECTED Final    Comment: (NOTE) The Coronavirus on the Respiratory Panel, DOES NOT test for the novel  Coronavirus (2019 nCoV)    Coronavirus HKU1 NOT DETECTED NOT DETECTED Final   Coronavirus NL63 NOT DETECTED NOT DETECTED Final   Coronavirus OC43 NOT DETECTED NOT DETECTED Final   Metapneumovirus NOT DETECTED NOT DETECTED Final   Rhinovirus / Enterovirus NOT DETECTED NOT DETECTED Final   Influenza A NOT DETECTED NOT DETECTED Final   Influenza B NOT DETECTED NOT DETECTED Final   Parainfluenza Virus 1 NOT DETECTED NOT DETECTED Final   Parainfluenza Virus 2 NOT DETECTED NOT DETECTED Final   Parainfluenza Virus 3 NOT DETECTED NOT DETECTED Final   Parainfluenza Virus 4 NOT DETECTED NOT DETECTED Final   Respiratory Syncytial Virus NOT DETECTED NOT DETECTED Final   Bordetella pertussis NOT DETECTED NOT DETECTED Final   Bordetella Parapertussis NOT DETECTED NOT DETECTED Final   Chlamydophila pneumoniae NOT DETECTED NOT DETECTED Final   Mycoplasma pneumoniae NOT DETECTED NOT DETECTED Final    Comment: Performed at [facility], [address], [place],  [address]  Blood culture (routine x 2)     Status: None (Preliminary result)   Collection Time: 05/22/2021  5:38 PM   Specimen: BLOOD  Result Value Ref Range Status  Specimen Description   Final    BLOOD PORTA CATH Performed at [facility] [address] [place], [place] [address]    Special Requests   Final    BOTTLES DRAWN AEROBIC AND ANAEROBIC Blood Culture adequate volume Performed at [facility] [address] [place], [place] [address]    Culture   Final    NO GROWTH 2 DAYS Performed at [facility] [address], [place], [place] [address]    Report Status PENDING  Incomplete  Blood culture (routine x 2)      Status: None (Preliminary result)   Collection Time: 05/31/2021  6:28 PM   Specimen: BLOOD  Result Value Ref Range Status   Specimen Description   Final    BLOOD SPECIMEN SOURCE NOT MARKED ON REQUISITION Performed at [facility] [address] [place], [place] [address]    Special Requests   Final    BOTTLES DRAWN AEROBIC ONLY Blood Culture adequate volume Performed at [facility] [address], [place], [place] [address]    Culture   Final    NO GROWTH 2 DAYS Performed at [facility] [address] [place], [place] [address]    Report Status PENDING  Incomplete  MRSA Next Gen by PCR, Nasal     Status: None   Collection Time: 06/03/2021 11:38 PM   Specimen: Nasal Mucosa; Nasal Swab  Result Value Ref Range Status   MRSA by PCR Next Gen NOT DETECTED NOT DETECTED Final    Comment: (NOTE) The GeneXpert MRSA Assay (FDA approved for NASAL specimens only), is one component of a comprehensive MRSA colonization surveillance program. It is not intended to diagnose MRSA infection nor to guide or monitor treatment for MRSA infections. Test performance is not FDA approved in patients less than 7 years old. Performed at [facility], [place] [address] [place], [place] [address]       Radiology Studies: DG Chest 1 View  Result Date: 06/10/2021 CLINICAL DATA:  [place], dehydration EXAM: CHEST  1 VIEW COMPARISON:  04/26/2021 FINDINGS: Single frontal view of the chest demonstrates right chest wall port unchanged. Multi lead pacer is stable. Cardiac silhouette is unremarkable. Slight progression of the multifocal right-sided pneumonia seen previously. Trace right pleural effusion unchanged. Slight increased interstitial and ground-glass opacity within the left upper lobe. Right apical pleural thickening is unchanged since 04/10/2021, but has noticeably progressed since the 07/15/2020 CT and 02/03/2020 x-ray. No acute bony  abnormalities. IMPRESSION: 1. Progressive multifocal bilateral airspace disease as above, consistent with worsening pneumonia. 2. Asymmetric right apical pleural thickening, which has progressed since the 11/05/2020 chest CT. 3. Given the persistent airspace disease and prominent right apical pleural thickening, follow-up chest CT with IV contrast may be useful to exclude underlying neoplasm. Electronically Signed   By: Randa Ngo M.D.   On: 05/19/2021 17:09   DG Lumbar Spine Complete  Result Date: 05/31/2021 CLINICAL DATA:  Dehydration, previous fall EXAM: LUMBAR SPINE - COMPLETE 4+ VIEW COMPARISON:  04/24/2021 FINDINGS: Negative for fracture. Moderate narrowing of the interspace L4-5 with stable grade 1 anterolisthesis. Implanted sacral stimulator device. Right hip arthroplasty components partially visualized. Cholecystectomy clips. Aortic Atherosclerosis (ICD10-170.0). IMPRESSION: No acute findings.  Stable grade 1 anterolisthesis L4-5. Electronically Signed   By: Lucrezia Europe M.D.   On: 06/07/2021 16:59   DG Pelvis 1-2 Views  Result Date: 05/17/2021 CLINICAL DATA:  [place], weakness EXAM: PELVIS - 1-2  VIEW COMPARISON:  08/03/2011 FINDINGS: 2 frontal views of the pelvis and bilateral hips are obtained. Right hip arthroplasty is identified, with no evidence of acute complication. Mild left hip osteoarthritis. No acute displaced fracture, subluxation, or dislocation. Abandoned fragmented stimulator wire overlies the right sacral ala. Nerve stimulator and contiguous lead within the left lower abdomen and pelvis. Soft tissues are unremarkable. IMPRESSION: 1. No acute displaced fracture. Electronically Signed   By: Randa Ngo M.D.   On: 05/22/2021 17:05   CT CHEST W CONTRAST  Result Date: 05/19/2021 CLINICAL DATA:  Pneumonia, respiratory illness, abnormal chest x-ray EXAM: CT CHEST WITH CONTRAST TECHNIQUE: Multidetector CT imaging of the chest was performed during intravenous contrast administration.  RADIATION DOSE REDUCTION: This exam was performed according to the departmental dose-optimization program which includes automated exposure control, adjustment of the mA and/or kV according to patient size and/or use of iterative reconstruction technique. CONTRAST:  55mL OMNIPAQUE IOHEXOL 300 MG/ML  SOLN COMPARISON:  07/15/2020 FINDINGS: Cardiovascular: Extensive multi-vessel coronary artery calcification. Global cardiac size within normal limits. Left subclavian pacemaker in place with leads within the right heart and left ventricular venous outflow. No pericardial effusion. The central pulmonary arteries are enlarged in keeping with changes of pulmonary arterial hypertension, stable since prior examination. Moderate atherosclerotic calcification noted within the thoracic aorta. No aortic aneurysm. 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 Right internal jugular chest port tip within the right atrium. Mediastinum/Nodes: The visualized thyroid is unremarkable. There is shotty right paratracheal and pre-vascular adenopathy without frankly pathologic enlargement. There is necrotic right hilar adenopathy best seen on image # 74/2. The esophagus is unremarkable. Lungs/Pleura: Right-sided volume loss with chronic collapse and traction bronchiectasis of the right middle lobe again noted. There has developed masslike consolidation within the anterior segment of the right upper lobe un wit vaults in pleural retraction. This involves an area of more focal consolidation on a prior examination and measures up to 2.5 x 2.6 cm in size on axial image # 63/5. There is associated partially loculated right pleural effusion noted anteriorly. Asymmetric bronchial wall thickening noted within the right upper lobe in keeping with airway inflammation. There is again noted diffuse bronchiectasis, airway impaction, and peripheral, scattered areas of subpleural reticulation and consolidation most suggestive of chronic atypical  infection and/or infectious bronchiolitis. This is not well assessed due to respiratory motion artifact, but appears grossly stable since prior examination. No pneumothorax. No pleural effusion on the left. Upper Abdomen: No acute abnormality. Musculoskeletal: Lytic lesion has developed within the right seventh rib posterolaterally the left clavicle both medially and distally as well as throughout the thoracic spine best appreciated within T1. Several additional foci of periosteal reaction or noted within several right ribs suspicious for additional foci of metastatic disease. IMPRESSION: Interval development of masslike consolidation within the a right upper lobe in an area of more focal consolidation noted on prior examination suspicious for primary bronchogenic malignancy. Associated shotty right pre-vascular, and periaortic adenopathy as well as necrotic right hilar adenopathy. Interval development of numerous lytic lesions throughout the visualized axial skeleton. PET CT examination may be helpful for further evaluation. Superimposed changes of a chronic atypical infection or infectious bronchiolitis with bronchiolectasis, airway impaction, and peripheral subpleural consolidation reticulation grossly stable since prior examination. Interval development of partially loculated right pleural effusion. Diagnostic thoracentesis may be helpful for further management. Extensive multi-vessel coronary artery calcification. Morphologic changes in keeping with pulmonary arterial hypertension. Aortic Atherosclerosis (ICD10-I70.0). Electronically Signed   By: Linwood Dibbles.D.  On: 05/19/2021 00:59   XR C-ARM NO REPORT  Result Date: 06/09/2021 Please see Notes tab for imaging impression.     LOS: 1 day    Cordelia Poche, MD [facility] 05/24/2021, 2:46 PM   If 7PM-7AM, please contact night-coverage [email or web address]

## 2021-05-20 NOTE — Anesthesia Postprocedure Evaluation (Signed)
Anesthesia Post Note ? ?Patient: KAMILLAH DIDONATO ? ?Procedure(s) Performed: ESOPHAGOGASTRODUODENOSCOPY (EGD) WITH PROPOFOL ? ?  ? ?Patient location during evaluation: Endoscopy ?Anesthesia Type: MAC ?Level of consciousness: awake and alert ?Pain management: pain level controlled ?Vital Signs Assessment: post-procedure vital signs reviewed and stable ?Respiratory status: spontaneous breathing, nonlabored ventilation, respiratory function stable and patient connected to nasal cannula oxygen ?Cardiovascular status: stable and blood pressure returned to baseline ?Postop Assessment: no apparent nausea or vomiting ?Anesthetic complications: no ? ? ?No notable events documented. ? ?Last Vitals:  ?Vitals:  ? 05/29/2021 1000 06/05/2021 1008  ?BP: 133/79   ?Pulse: (!) 120 (!) 117  ?Resp: 19 14  ?Temp:    ?SpO2: 98% 99%  ?  ?Last Pain:  ?Vitals:  ? 06/15/2021 1401  ?TempSrc:   ?PainSc: 8   ? ? ?  ?  ?  ?  ?  ?  ? ?Julyana Woolverton ? ? ? ? ?

## 2021-05-20 NOTE — Anesthesia Preprocedure Evaluation (Signed)
Anesthesia Evaluation  ?Patient identified by MRN, date of birth, ID band ?Patient awake ? ? ? ?Reviewed: ?Allergy & Precautions, NPO status , Patient's Chart, lab work & pertinent test results ? ?History of Anesthesia Complications ?Negative for: history of anesthetic complications ? ?Airway ?Mallampati: IV ? ?TM Distance: >3 FB ?Neck ROM: Full ? ?Mouth opening: Limited Mouth Opening ? Dental ? ?(+) Missing, Dental Advisory Given,  ?  ?Pulmonary ?shortness of breath, neg sleep apnea, COPD,  oxygen dependent, neg recent URI, former smoker,  ?  ? ?+ decreased breath sounds ? ? ? ? ? Cardiovascular ?hypertension, Pt. on medications ?(-) angina+ CAD and +CHF  ?+ dysrhythmias Atrial Fibrillation + pacemaker  ?Rhythm:Irregular Rate:Tachycardia ? ? ?  ?Neuro/Psych ?TIA Neuromuscular disease negative psych ROS  ? GI/Hepatic ?GERD  ,(+) Hepatitis -Lab Results ?     Component                Value               Date                 ?     ALT                      12                  06/08/2021           ?     AST                      52 (H)              06/07/2021           ?     ALKPHOS                  115                 06/03/2021           ?     BILITOT                  1.2                 06/14/2021           ? ?? GI bleed ?  ?Endo/Other  ?negative endocrine ROS ? Renal/GU ?Renal diseaseLab Results ?     Component                Value               Date                 ?     CREATININE               0.62                05/19/2021           ?Lab Results ?     Component                Value               Date                 ?     K  4.6                 05/19/2021           ?  ? ?  ?Musculoskeletal ? ?(+) Arthritis , Fibromyalgia - ? Abdominal ?  ?Peds ? Hematology ? ?(+) Blood dyscrasia, anemia , Lab Results ?     Component                Value               Date                 ?     WBC                      16.3 (H)            05/25/2021           ?     HGB                       10.2 (L)            06/16/2021           ?     HCT                      31.9 (L)            05/27/2021           ?     MCV                      87.6                05/27/2021           ?     PLT                      255                 06/13/2021           ? ?S/p blood transfusions ?eliquis held 2/27   ?Anesthesia Other Findings ? ? Reproductive/Obstetrics ? ?  ? ? ? ? ? ? ? ? ? ? ? ? ? ?  ?  ? ? ? ? ? ? ? ? ?Anesthesia Physical ?Anesthesia Plan ? ?ASA: 3 ? ?Anesthesia Plan: MAC  ? ?Post-op Pain Management: Minimal or no pain anticipated  ? ?Induction:  ? ?PONV Risk Score and Plan: 2 and Propofol infusion and Treatment may vary due to age or medical condition ? ?Airway Management Planned: Mask ? ?Additional Equipment: None ? ?Intra-op Plan:  ? ?Post-operative Plan:  ? ?Informed Consent: I have reviewed the patients History and Physical, chart, labs and discussed the procedure including the risks, benefits and alternatives for the proposed anesthesia with the patient or authorized representative who has indicated his/her understanding and acceptance.  ? ? ? ?Dental advisory given ? ?Plan Discussed with: CRNA and Anesthesiologist ? ?Anesthesia Plan Comments:   ? ? ? ? ? ? ?Anesthesia Quick Evaluation ? ?

## 2021-05-20 NOTE — Transfer of Care (Signed)
Immediate Anesthesia Transfer of Care Note ? ?Patient: [name] ? ?Procedure(s) Performed: ESOPHAGOGASTRODUODENOSCOPY (EGD) WITH PROPOFOL ? ?Patient Location: PACU ? ?Anesthesia Type:MAC ? ?Level of Consciousness: awake and drowsy ? ?Airway & Oxygen Therapy: Patient Spontanous Breathing and Patient connected to face mask oxygen ? ?Post-op Assessment: Report given to RN and Post -op Vital signs reviewed and stable ? ?Post vital signs: Reviewed and stable ? ?Last Vitals:  ?Vitals Value Taken Time  ?BP 128/77 06/10/2021 0945  ?Temp 36.8 ?C 06/09/2021 0945  ?Pulse 127 05/17/2021 0952  ?Resp 23 05/27/2021 0952  ?SpO2 99 % 05/30/2021 0952  ?Vitals shown include unvalidated device data. ? ?Last Pain:  ?Vitals:  ? 06/12/2021 0910  ?TempSrc: Temporal  ?PainSc: 8   ?   ? ?Patients Stated Pain Goal: 7 (05/17/2021 2105) ? ?Complications: No notable events documented. ?

## 2021-05-20 NOTE — Plan of Care (Signed)
Pt alert and oriented x 4. Med compliant. Last bm 3/3. Pt is incontinent. Sacral wound. Pt has received 2 dose of oxycodone for pain this shift ?Problem: Skin Integrity: ?Goal: Risk for impaired skin integrity will decrease ?Outcome: Not Progressing ? Wound to sacrum stg 2. Nonhealing with black tissue. Dsg changed this am.  ? ?Problem: Education: ?Goal: Knowledge of General Education information will improve ?Description: Including pain rating scale, medication(s)/side effects and non-pharmacologic comfort measures ?Outcome: Progressing ?  ?Problem: Health Behavior/Discharge Planning: ?Goal: Ability to manage health-related needs will improve ?Outcome: Progressing ?  ?Problem: Clinical Measurements: ?Goal: Ability to maintain clinical measurements within normal limits will improve ?Outcome: Progressing ?Goal: Will remain free from infection ?Outcome: Progressing ?Goal: Diagnostic test results will improve ?Outcome: Progressing ?Goal: Respiratory complications will improve ?Outcome: Progressing ?Goal: Cardiovascular complication will be avoided ?Outcome: Progressing ?  ?Problem: Activity: ?Goal: Risk for activity intolerance will decrease ?Outcome: Progressing ?  ?Problem: Nutrition: ?Goal: Adequate nutrition will be maintained ?Outcome: Progressing ?  ?Problem: Coping: ?Goal: Level of anxiety will decrease ?Outcome: Progressing ?  ?Problem: Elimination: ?Goal: Will not experience complications related to bowel motility ?Outcome: Progressing ?Goal: Will not experience complications related to urinary retention ?Outcome: Progressing ?  ?Problem: Pain Managment: ?Goal: General experience of comfort will improve ?Outcome: Progressing ?  ?Problem: Safety: ?Goal: Ability to remain free from injury will improve ?Outcome: Progressing ?  ?

## 2021-05-20 NOTE — Op Note (Signed)
[facility] ?Patient Name: [name] ?Procedure Date: 05/19/2021 ?MRN: [id number] ?Attending MD: Juanita Craver , MD ?Date of Birth: [birth year] ?CSN: [id number] ?Age: 76 ?Admit Type: Inpatient ?Procedure:                Diagnostic EGD. ?Indications:              Melena, Iron deficiency anemia, GERD. ?Providers:                Juanita Craver, MD, Grace Isaac, RN, William Dalton,  ?                          Technician, Stephanie [place] CRNA ?Referring MD:             Ann Held, MD ?Medicines:                Monitored Anesthesia Care ?Complications:            No immediate complications. ?Estimated Blood Loss:     Estimated blood loss: none. ?Procedure:                Pre-Anesthesia Assessment: - Prior to the  ?                          procedure, a history and physical was performed,  ?                          and patient medications and allergies were  ?                          reviewed. The patient's tolerance of previous  ?                          anesthesia was also reviewed. The risks and  ?                          benefits of the procedure and the sedation options  ?                          and risks were discussed with the patient. All  ?                          questions were answered, and informed consent was  ?                          obtained. Prior Anticoagulants: The patient has  ?                          taken Eliquis (apixaban), last dose was 4 days  ?                          prior to procedure. ASA Grade Assessment: IV - A  ?                          patient with severe systemic disease that is a  ?  constant threat to life. After reviewing the risks  ?                          and benefits, the patient was deemed in  ?                          satisfactory condition to undergo the procedure.  ?                          After obtaining informed consent, the endoscope was  ?                          passed under direct vision. Throughout the   ?                          procedure, the patient's blood pressure, pulse, and  ?                          oxygen saturations were monitored continuously. The  ?                          [id number]-[id number] ([id number]) Olympus endoscope was introduced  ?                          through the mouth, and advanced to the second part  ?                          of duodenum. The EGD was accomplished without  ?                          difficulty. The patient tolerated the procedure  ?                          well. ?Scope In: ?Scope Out: ?Findings: ?     The examined esophagus and the GEJ appeared widely patent and normal;  ?     SCJ was measured at 36 cm. ?     Patchy mildly erythematous mucosa without bleeding was found in the  ?     entire examined stomach-consistent with mild gastritis. ?     The cardia and gastric fundus were normal on retroflexion. ?     The examined duodenum was normal. ?     No fresh or old heme noted on exam; no AVM's ulcers noted ?Impression:               - Normal appearing, widely patent esophagus and GEJ. ?                          - Erythematous mucosa in the stomach-mild patchy  ?                          gastritis. ?                          - Normal examined duodenum. ?                          -  No specimens collected. ?Moderate Sedation: ?     MAC used. ?Recommendation:           - Full liquid diet today. ?                          - Continue present medications. ?                          - Serial CBC's. ?Procedure Code(s):        --- Professional --- ?                          [phone], Esophagogastroduodenoscopy, flexible,  ?                          transoral; diagnostic, including collection of  ?                          specimen(s) by brushing or washing, when performed  ?                          (separate procedure) ?Diagnosis Code(s):        --- Professional --- ?                          K92.1, Melena (includes Hematochezia) ?                          D50.9, Iron deficiency anemia,  unspecified ?                          K21.9, Gastro-esophageal reflux disease without  ?                          esophagitis ?CPT copyright 2019 [employer]. All rights reserved. ?The codes documented in this report are preliminary and upon coder review may  ?be revised to meet current compliance requirements. ?Juanita Craver, MD ?Juanita Craver, MD ?05/19/2021 9:52:21 AM ?This report has been signed electronically. ?Number of Addenda: 0 ?

## 2021-05-20 NOTE — Assessment & Plan Note (Addendum)
Diagnosed prior to admission. Urine culture from 2/27 significant for enterococcus faecium Currently no symptoms - Continue Vancomycin IV day 3 of 3

## 2021-05-21 ENCOUNTER — Encounter (HOSPITAL_COMMUNITY): Payer: Self-pay | Admitting: Gastroenterology

## 2021-05-21 DIAGNOSIS — D638 Anemia in other chronic diseases classified elsewhere: Secondary | ICD-10-CM | POA: Diagnosis not present

## 2021-05-21 DIAGNOSIS — D649 Anemia, unspecified: Secondary | ICD-10-CM | POA: Diagnosis not present

## 2021-05-21 DIAGNOSIS — I5032 Chronic diastolic (congestive) heart failure: Secondary | ICD-10-CM | POA: Diagnosis not present

## 2021-05-21 DIAGNOSIS — I480 Paroxysmal atrial fibrillation: Secondary | ICD-10-CM | POA: Diagnosis not present

## 2021-05-21 LAB — CBC
HCT: 30.2 % — ABNORMAL LOW (ref 36.0–46.0)
Hemoglobin: 9.6 g/dL — ABNORMAL LOW (ref 12.0–15.0)
MCH: 28 pg (ref 26.0–34.0)
MCHC: 31.8 g/dL (ref 30.0–36.0)
MCV: 88 fL (ref 80.0–100.0)
Platelets: 146 10*3/uL — ABNORMAL LOW (ref 150–400)
RBC: 3.43 MIL/uL — ABNORMAL LOW (ref 3.87–5.11)
RDW: 19.4 % — ABNORMAL HIGH (ref 11.5–15.5)
WBC: 20.3 10*3/uL — ABNORMAL HIGH (ref 4.0–10.5)
nRBC: 0.1 % (ref 0.0–0.2)

## 2021-05-21 LAB — BASIC METABOLIC PANEL
Anion gap: 9 (ref 5–15)
BUN: 28 mg/dL — ABNORMAL HIGH (ref 8–23)
CO2: 30 mmol/L (ref 22–32)
Calcium: 9.2 mg/dL (ref 8.9–10.3)
Chloride: 93 mmol/L — ABNORMAL LOW (ref 98–111)
Creatinine, Ser: 0.5 mg/dL (ref 0.44–1.00)
GFR, Estimated: >60 mL/min (ref >60)
Glucose, Bld: 155 mg/dL — ABNORMAL HIGH (ref 70–99)
Potassium: 3.7 mmol/L (ref 3.5–5.1)
Sodium: 132 mmol/L — ABNORMAL LOW (ref 135–145)

## 2021-05-21 LAB — COPPER, SERUM: Copper: 176 ug/dL — ABNORMAL HIGH (ref 80–158)

## 2021-05-21 MED ORDER — PEG 3350-KCL-NA BICARB-NACL 420 G PO SOLR
4000.0000 mL | Freq: Once | ORAL | Status: AC
  Administered 2021-05-21: 4000 mL via ORAL
  Filled 2021-05-21: qty 4000

## 2021-05-21 NOTE — Assessment & Plan Note (Addendum)
This was steadily worsening this week, until today, down to 16K.  My suspicion for empyema is low.  She is not a good candidate for chest tube or decortication.  She also has other potential explanations for leukocytosis

## 2021-05-21 NOTE — Progress Notes (Signed)
[facility] ?Subjective: ?Since I last evaluated the patient, she seems to be fairly stable.  She denies having abdominal pain nausea vomiting.  He says she has not had a BM.  Her EGD done yesterday was unrevealing with no source of bleeding identified. ?Objective: ?Vital signs in last 24 hours: ?Temp:  [97.5 ?F (36.4 ?C)-98.5 ?F (36.9 ?C)] 97.8 ?F (36.6 ?C) (03/05 0504) ?Pulse Rate:  [117-132] 118 (03/05 0504) ?Resp:  [14-36] 18 (03/05 0504) ?BP: (128-164)/(77-102) 144/100 (03/05 0504) ?SpO2:  [89 %-100 %] 96 % (03/05 0504) ?Last BM Date : 05/19/21 ? ?Intake/Output from previous day: ?03/04 0701 - 03/05 0700 ?In: 200 [I.V.:200] ?Out: 865 [Urine:865] ?Intake/Output this shift: ?No intake/output data recorded. ? ?General appearance: alert, cooperative, appears stated age, fatigued, no distress, and pale ?Resp: clear to auscultation bilaterally ?Cardio: regular rate and rhythm, S1, S2 normal, no murmur, click, rub or gallop ?GI: soft, non-tender; bowel sounds normal; no masses,  no organomegaly ? ?Lab Results: ?Recent Labs  ?  05/22/2021 ?4562 05/19/21 ?5638 05/28/2021 ?0436  ?WBC 10.8* 10.0 16.3*  ?HGB 6.0* 8.9* 10.2*  ?HCT 20.3* 27.7* 31.9*  ?PLT 308 276 255  ? ?BMET ?Recent Labs  ?  06/14/2021 ?1605 05/19/21 ?9373  ?NA 138 139  ?K 4.1 4.6  ?CL 97* 101  ?CO2 29 27  ?GLUCOSE 99 99  ?BUN 30* 20  ?CREATININE 1.03* 0.62  ?CALCIUM 8.9 8.4*  ? ?LFT ?Recent Labs  ?  05/22/2021 ?4287  ?PROT 6.7  ?ALBUMIN 2.9*  ?AST 52*  ?ALT 12  ?ALKPHOS 115  ?BILITOT 1.2  ? ?PT/INR ?Recent Labs  ?  06/03/2021 ?6811  ?LABPROT 16.7*  ?INR 1.4*  ? ?Medications: I have reviewed the patient's current medications. ?Prior to Admission:  ?Facility-Administered Medications Prior to Admission  ?Medication Dose Route Frequency Provider Last Rate Last Admin  ? methylPREDNISolone acetate (DEPO-MEDROL) injection 80 mg  80 mg Other Once Magnus Sinning, MD      ? ?Medications Prior to Admission  ?Medication Sig Dispense Refill Last Dose  ? acetaminophen  (TYLENOL) 650 MG CR tablet Take 650 mg by mouth every 8 (eight) hours as needed for pain.   Past Month  ? albuterol (VENTOLIN HFA) 108 (90 Base) MCG/ACT inhaler Inhale 2 puffs into the lungs every 6 (six) hours as needed for wheezing or shortness of breath.   Past Week  ? amLODipine (NORVASC) 2.5 MG tablet TAKE 1 TABLET BY MOUTH AT BEDTIME (Patient taking differently: Take 2.5 mg by mouth at bedtime.) 90 tablet 0 05/15/2021  ? apixaban (ELIQUIS) 5 MG TABS tablet Take 1 tablet (5 mg total) by mouth 2 (two) times daily. 180 tablet 0 05/15/2021 at 0800  ? atorvastatin (LIPITOR) 20 MG tablet TAKE 1 TABLET BY MOUTH ONCE DAILY AT  6PM (Patient taking differently: Take 20 mg by mouth every evening.) 90 tablet 1 05/17/2021  ? gabapentin (NEURONTIN) 600 MG tablet TAKE 1 TABLET BY MOUTH ONCE DAILY IN THE MORNING, AND  AT  NOON,  AND  IN  THE  EVENING,  AND  AT  BEDTIME (Patient taking differently: Take 600 mg by mouth See admin instructions. TAKE 600mg   BY MOUTH ONCE DAILY IN THE MORNING, AND AT NOON, AND IN THE EVENING, AND AT BEDTIME) 120 tablet 0 06/09/2021 at 1200  ? levofloxacin (LEVAQUIN) 500 MG tablet Take 1 tablet (500 mg total) by mouth daily. 3 tablet 0 06/11/2021  ? oxyCODONE-acetaminophen (PERCOCET) 10-325 MG tablet Take 1 tablet by mouth every 4 (  four) hours as needed. PAIN 120 tablet 0 06/12/2021 at 1200  ? OXYGEN Inhale 4 L/min into the lungs continuous.   CONTINUOS  ? pantoprazole (PROTONIX) 40 MG tablet Take 1 tablet (40 mg total) by mouth 2 (two) times daily. (Patient taking differently: Take 40 mg by mouth daily.) 180 tablet 3 05/17/2021  ? promethazine (PHENERGAN) 25 MG tablet Take 1 tablet (25 mg total) by mouth every 6 (six) hours as needed for nausea or vomiting. (Patient taking differently: Take 12.5-25 mg by mouth every 6 (six) hours as needed for nausea or vomiting.) 30 tablet 0 05/19/2021  ? tiZANidine (ZANAFLEX) 4 MG tablet Take 1 tablet (4 mg total) by mouth 3 (three) times daily as needed. for muscle spams 90  tablet 2 05/24/2021  ? triamcinolone ointment (KENALOG) 0.1 % Apply 1 application topically daily as needed (to affected areas- for psoriasis).    Past Week  ? umeclidinium bromide (INCRUSE ELLIPTA) 62.5 MCG/ACT AEPB Inhale 1 puff into the lungs daily. (Patient taking differently: Inhale 1 puff into the lungs daily as needed (for shortness of breath).) 90 each 3 05/16/2021  ? zolpidem (AMBIEN) 5 MG tablet TAKE 1 TABLET BY MOUTH EVERY DAY AT BEDTIME AS NEEDED (Patient taking differently: Take 5 mg by mouth at bedtime as needed for sleep.) 30 tablet 2 05/17/2021  ? furosemide (LASIX) 40 MG tablet TAKE 1 TABLET BY MOUTH ONCE DAILY IN THE MORNING (Patient not taking: Reported on 05/19/2021) 90 tablet 0 Not Taking  ? nitroGLYCERIN (NITROSTAT) 0.4 MG SL tablet Place 1 tablet (0.4 mg total) under the tongue every 5 (five) minutes as needed. CHEST PAIN (Patient not taking: Reported on 05/25/2021) 25 tablet 6 Not Taking  ? ?Scheduled: ? vitamin C  250 mg Oral BID  ? atorvastatin  20 mg Oral QHS  ? Chlorhexidine Gluconate Cloth  6 each Topical Daily  ? collagenase   Topical Daily  ? feeding supplement  1 Container Oral BID BM  ? furosemide  40 mg Oral Daily  ? gabapentin  300 mg Oral BID  ? And  ? gabapentin  600 mg Oral QHS  ? levofloxacin  500 mg Oral Daily  ? multivitamin with minerals  1 tablet Oral Daily  ? nutrition supplement (JUVEN)  1 packet Oral BID BM  ? pantoprazole  40 mg Oral Daily  ? sodium chloride flush  10-40 mL Intracatheter Q12H  ? umeclidinium bromide  1 puff Inhalation Daily  ? zinc sulfate  220 mg Oral Daily  ? ?Continuous: ? ?Assessment/Plan: ?1) Acute on chronic anemia-EGD unrevealing.  Will prep for colonoscopy tomorrow orders written. ?2) Dysphagia-no strictures or esophagitis recognized on EGD she may have component of dysmotility. ?3) Atrial fibrillation on Eliquis at home. ?4) COPD/emphysema/ chronic respiratory failure on chronic oxygen therapy. ?5) Multifocal pneumonia on antibiotics. ?6) Chronic back  pain. ? LOS: 2 days  ? ?Juanita Craver ?05/21/2021, 7:48 AM ? ? ?

## 2021-05-21 NOTE — Progress Notes (Addendum)
PROGRESS NOTE    [name]  BZJ:[id number] DOB: 01-12-1946 DOA: 06/08/2021 PCP: Ann Held, DO   Brief Narrative: [name] is a 76 y.o. female with a history of atrial fibrillation/flutter on Eliquis, CAD, COPD, recent wrist fracture, s/p PPM, MAI infection, chronic pain, bronchiectasis, chronic CHF. Patient presented secondary to worsening back pain and weakness but found to also have a history of melena and concern for upper GI bleeding. FOBT was positive in the ED. GI consulted and patient given 2 units of PRBC for a hemoglobin of 6 with symptoms. Incidentally, concern for multifocal pneumonia on chest x-ray. CT chest concerning for RUL consolidation concerning for mass. Pulmonology consulted with recommendation for outpatient PET CT and have transitioned the patient to Levaquin. Upper endoscopy significant for gastritis.   Assessment and Plan: * Symptomatic anemia Secondary to GI bleeding as evidenced by melena and guaiac positive stool test. [facility] consulted on admission. Patient started empirically on Protonix IV drip. Hemoglobin of 6 on admission, down from baseline of about 10. 2 units of PRBC ordered by EDP with rebound hemoglobin of 8.9 >10.2. Patient with continued melena. GI performed upper endoscopy on 3/4 which was significant for gastritis but no other source for anemia -Daily CBC -Follow-up GI recommendations: Continue protonix, plan for colonoscopy on 3/6  Right upper lobe consolidation ([place]) Seen on CT scan, concerning for malignancy, but in setting of MAI. Also noted was a small loculated effusion. Pulmonology consulted with recommendation for outpatient PET CT in 2 weeks and follow-up with pulmonology. Recommendation for no aspiration of right-sided loculated effusion at this time.  Pressure injury of skin Likely related to recent prolonged immobility. Located on medial sacrum. Wound nurse recommendation (3/3): I will provide the patient with a  mattress replacement with low air loss feature today and provide Nursing with guidance to turn from side to side and minimize the supine position. Heels are to be floated. Topical care is with collagenase (Santyl) ointment applied once daily and PRN soiling. This is to be topped with a saline dampened gauze dressing, dry gauze and secured with the silicone foam. The "tip" of the silicone foam is to be oriented away from the anus. Nutrition will need to be maximized to enhance wound healing.  Recurrent falls Unsure of etiology. Possibly related to anemia. Patient is also on gabapentin with low renal function. Back pain is also a barrier to successful mobility. PT recommending SNF. -Consult TOC for SNF discharge  Multifocal pneumonia Recurrent issues with pneumonia. Patient with a history of MAI not currently on treatment (completed treatment in 2022). Last seen by ID in July 2022. Negative influenza/COVID. Started empirically on Ceftriaxone and azithromycin. Recently complete a 7 day treatment course of Levaquin for pneumonia. History of MAI and bronchiectasis makes this more complicated. Symptoms, per patient, are at baseline. RVP negative. MRSA PCR negative. Pulmonology transitioned to [place] with recommendation for 10 day course -Levaquin 500 mg daily x10 days, per pulmonology recommendations -Sputum culture, urine strep/legionella  Leukocytosis Worsening. Afebrile. In setting of pneumonia. -Obtain CBC with differential  Pulmonary MAI (mycobacterium avium-intracellulare) infection ([place]) See problem, Multifocal pneumonia  Hyperlipidemia LDL goal <100 -Continue Lipitor  Chronic diastolic CHF (congestive heart failure) (HCC) Stable. Patient is euvolemic. She is on Lasix as an outpatient. -Daily weights/strict in and out -Continue home Lasix  Bronchiectasis ([place]) Chronic issue. Patient follows with pulmonology.  Insomnia -Continue Ambien qhs PRN  UTI (urinary tract  infection) Diagnosed prior to admission. Urine  culture from 2/27 significant for enterococcus faecium, susceptible to ampicillin. Patient received Levaquin for treatment. Unsure if this regimen would have treated infection. Since patient has no symptoms, repeat urine culture ordered. If repeat urine culture is positive, will treat. -Follow-up urine culture  Anemia, chronic disease Patient follows with hematology/oncology, Dr. Marin Olp. Right port placed in upper chest. Patient receives intermittent blood transfusions. Baseline hemoglobin of about 10.  Chronic hypoxemic respiratory failure (HCC) -Continue home oxygen at 4 L/min  Fibromyalgia See problem, Chronic pain syndrome  COPD with emphysema (HCC) -Continue Incruse Ellipta and albuterol  Atrial fibrillation/flutter Currently seems to be in sinus rhythm with increased rate. Patient is on Eliquis for stroke prevention. She is not on rate or rhythm control medication. -Telemetry while having acute GI bleeding  Essential hypertension Currently mostly normotensive. Patient is on amlodipine as an outpatient. -Hold amlodipine for now in setting of acute GI bleeding  Chronic pain syndrome -Continue home Percocet and gabapentin (decrease dose for renal function)    DVT prophylaxis: SCDs Code Status:   Code Status: Full Code Family Communication: None at bedside Disposition Plan: Discharge home likely in 3-5 days pending GI recommendations/management, stable hemoglobin, improvement of GI bleeding and pulmonology recommendations   Consultants:  [facility]  Procedures:  Upper endoscopy (3/4)  Antimicrobials: Ceftriaxone Azithromycin    Subjective: Feeling weak. No bowel movement overnight.  Objective: BP 131/77 (BP Location: Left Arm)    Pulse (!) 109    Temp 97.8 F (36.6 C)    Resp 17    Ht 5' (1.524 m)    Wt 41.7 kg    SpO2 94%    BMI 17.95 kg/m   Examination:  General exam: Appears calm and comfortable.  Weak appearing. Respiratory system: Clear to auscultation. Respiratory effort normal. Cardiovascular system: S1 & S2 heard, regular  rhythm with increased rate. Gastrointestinal system: Abdomen is nondistended, soft and nontender. No organomegaly or masses felt. Normal bowel sounds heard. Central nervous system: Alert and oriented. No focal neurological deficits. Musculoskeletal: No calf tenderness Skin: No cyanosis. No rashes Psychiatry: Judgement and insight appear normal. Mood & affect appropriate.    Data Reviewed: I have personally reviewed following labs and imaging studies  CBC Lab Results  Component Value Date   WBC 20.3 (H) 05/21/2021   RBC 3.43 (L) 05/21/2021   HGB 9.6 (L) 05/21/2021   HCT 30.2 (L) 05/21/2021   MCV 88.0 05/21/2021   MCH 28.0 05/21/2021   PLT 146 (L) 05/21/2021   MCHC 31.8 05/21/2021   RDW 19.4 (H) 05/21/2021   LYMPHSABS 0.4 (L) 05/19/2021   MONOABS 0.9 06/11/2021   EOSABS 0.0 05/25/2021   BASOSABS 0.0 14/97/0263     Last metabolic panel Lab Results  Component Value Date   NA 132 (L) 05/21/2021   K 3.7 05/21/2021   CL 93 (L) 05/21/2021   CO2 30 05/21/2021   BUN 28 (H) 05/21/2021   CREATININE 0.50 05/21/2021   GLUCOSE 155 (H) 05/21/2021   GFRNONAA >60 05/21/2021   GFRAA >60 12/01/2019   CALCIUM 9.2 05/21/2021   PHOS 3.0 05/17/2014   PROT 6.7 06/08/2021   ALBUMIN 2.9 (L) 06/02/2021   BILITOT 1.2 06/04/2021   ALKPHOS 115 06/02/2021   AST 52 (H) 06/02/2021   ALT 12 05/19/2021   ANIONGAP 9 05/21/2021    GFR: Estimated Creatinine Clearance: 39.4 mL/min (by C-G formula based on SCr of 0.5 mg/dL).  Recent Results (from the past 240 hour(s))  Resp Panel by RT-PCR (  Flu A&B, Covid) Nasopharyngeal Swab     Status: None   Collection Time: 06/14/2021  4:05 PM   Specimen: Nasopharyngeal Swab; Nasopharyngeal(NP) swabs in vial transport medium  Result Value Ref Range Status   SARS Coronavirus 2 by RT PCR NEGATIVE NEGATIVE Final    Comment:  (NOTE) SARS-CoV-2 target nucleic acids are NOT DETECTED.  The SARS-CoV-2 RNA is generally detectable in upper respiratory specimens during the acute phase of infection. The lowest concentration of SARS-CoV-2 viral copies this assay can detect is 138 copies/mL. A negative result does not preclude SARS-Cov-2 infection and should not be used as the sole basis for treatment or other patient management decisions. A negative result may occur with  improper specimen collection/handling, submission of specimen other than nasopharyngeal swab, presence of viral mutation(s) within the areas targeted by this assay, and inadequate number of viral copies(<138 copies/mL). A negative result must be combined with clinical observations, patient history, and epidemiological information. The expected result is Negative.  Fact Sheet for Patients:  EntrepreneurPulse.com.au  Fact Sheet for Healthcare Providers:  IncredibleEmployment.be  This test is no t yet approved or cleared by the [place] FDA and  has been authorized for detection and/or diagnosis of SARS-CoV-2 by FDA under an Emergency Use Authorization (EUA). This EUA will remain  in effect (meaning this test can be used) for the duration of the COVID-19 declaration under Section 564(b)(1) of the Act, 21 U.S.C.section 360bbb-3(b)(1), unless the authorization is terminated  or revoked sooner.       Influenza A by PCR NEGATIVE NEGATIVE Final   Influenza B by PCR NEGATIVE NEGATIVE Final    Comment: (NOTE) The Xpert Xpress SARS-CoV-2/FLU/RSV plus assay is intended as an aid in the diagnosis of influenza from Nasopharyngeal swab specimens and should not be used as a sole basis for treatment. Nasal washings and aspirates are unacceptable for Xpert Xpress SARS-CoV-2/FLU/RSV testing.  Fact Sheet for Patients: EntrepreneurPulse.com.au  Fact Sheet for Healthcare  Providers: IncredibleEmployment.be  This test is not yet approved or cleared by the [place] FDA and has been authorized for detection and/or diagnosis of SARS-CoV-2 by FDA under an Emergency Use Authorization (EUA). This EUA will remain in effect (meaning this test can be used) for the duration of the COVID-19 declaration under Section 564(b)(1) of the Act, 21 U.S.C. section 360bbb-3(b)(1), unless the authorization is terminated or revoked.  Performed at [facility], [place] [address] [place], [place] [address]   Respiratory (~20 pathogens) panel by PCR     Status: None   Collection Time: 06/16/2021  4:05 PM   Specimen: Nasopharyngeal Swab; Respiratory  Result Value Ref Range Status   Adenovirus NOT DETECTED NOT DETECTED Final   Coronavirus 229E NOT DETECTED NOT DETECTED Final    Comment: (NOTE) The Coronavirus on the Respiratory Panel, DOES NOT test for the novel  Coronavirus (2019 nCoV)    Coronavirus HKU1 NOT DETECTED NOT DETECTED Final   Coronavirus NL63 NOT DETECTED NOT DETECTED Final   Coronavirus OC43 NOT DETECTED NOT DETECTED Final   Metapneumovirus NOT DETECTED NOT DETECTED Final   Rhinovirus / Enterovirus NOT DETECTED NOT DETECTED Final   Influenza A NOT DETECTED NOT DETECTED Final   Influenza B NOT DETECTED NOT DETECTED Final   Parainfluenza Virus 1 NOT DETECTED NOT DETECTED Final   Parainfluenza Virus 2 NOT DETECTED NOT DETECTED Final   Parainfluenza Virus 3 NOT DETECTED NOT DETECTED Final   Parainfluenza Virus 4 NOT DETECTED NOT DETECTED Final   Respiratory  Syncytial Virus NOT DETECTED NOT DETECTED Final   Bordetella pertussis NOT DETECTED NOT DETECTED Final   Bordetella Parapertussis NOT DETECTED NOT DETECTED Final   Chlamydophila pneumoniae NOT DETECTED NOT DETECTED Final   Mycoplasma pneumoniae NOT DETECTED NOT DETECTED Final    Comment: Performed at [facility], [place] [address] [place], [place] [address]  Blood  culture (routine x 2)     Status: None (Preliminary result)   Collection Time: 05/29/2021  5:38 PM   Specimen: BLOOD  Result Value Ref Range Status   Specimen Description   Final    BLOOD PORTA CATH Performed at [facility] [address], [place], [place] [address]    Special Requests   Final    BOTTLES DRAWN AEROBIC AND ANAEROBIC Blood Culture adequate volume Performed at [facility] [address] [place], [place] [address]    Culture   Final    NO GROWTH 3 DAYS Performed at [facility], [place] [address], [place], [place] [address]    Report Status PENDING  Incomplete  Blood culture (routine x 2)     Status: None (Preliminary result)   Collection Time: 06/14/2021  6:28 PM   Specimen: BLOOD  Result Value Ref Range Status   Specimen Description   Final    BLOOD SPECIMEN SOURCE NOT MARKED ON REQUISITION Performed at [facility] [address], [place], [place] [address]    Special Requests   Final    BOTTLES DRAWN AEROBIC ONLY Blood Culture adequate volume Performed at [facility] [address] [place], [place] [address]    Culture   Final    NO GROWTH 3 DAYS Performed at [facility], [place] [address], [place],  [address]    Report Status PENDING  Incomplete  MRSA Next Gen by PCR, Nasal     Status: None   Collection Time: 06/14/2021 11:38 PM   Specimen: Nasal Mucosa; Nasal Swab  Result Value Ref Range Status   MRSA by PCR Next Gen NOT DETECTED NOT DETECTED Final    Comment: (NOTE) The GeneXpert MRSA Assay (FDA approved for NASAL specimens only), is one component of a comprehensive MRSA colonization surveillance program. It is not intended to diagnose MRSA infection nor to guide or monitor treatment for MRSA infections. Test performance is not FDA approved in patients less than 9 years old. Performed at [facility], [place] [address] [place],   [address]       Radiology Studies: No results found.    LOS: 2 days    Cordelia Poche, MD [facility] 05/21/2021, 12:58 PM   If 7PM-7AM, please contact night-coverage [email or web address]

## 2021-05-22 DIAGNOSIS — I5032 Chronic diastolic (congestive) heart failure: Secondary | ICD-10-CM | POA: Diagnosis not present

## 2021-05-22 DIAGNOSIS — D649 Anemia, unspecified: Secondary | ICD-10-CM | POA: Diagnosis not present

## 2021-05-22 DIAGNOSIS — I480 Paroxysmal atrial fibrillation: Secondary | ICD-10-CM | POA: Diagnosis not present

## 2021-05-22 DIAGNOSIS — D638 Anemia in other chronic diseases classified elsewhere: Secondary | ICD-10-CM | POA: Diagnosis not present

## 2021-05-22 LAB — TYPE AND SCREEN
ABO/RH(D): A NEG
Antibody Screen: NEGATIVE
Unit division: 0
Unit division: 0

## 2021-05-22 LAB — BASIC METABOLIC PANEL
Anion gap: 7 (ref 5–15)
BUN: 29 mg/dL — ABNORMAL HIGH (ref 8–23)
CO2: 31 mmol/L (ref 22–32)
Calcium: 8.8 mg/dL — ABNORMAL LOW (ref 8.9–10.3)
Chloride: 94 mmol/L — ABNORMAL LOW (ref 98–111)
Creatinine, Ser: 0.47 mg/dL (ref 0.44–1.00)
GFR, Estimated: >60 mL/min (ref >60)
Glucose, Bld: 106 mg/dL — ABNORMAL HIGH (ref 70–99)
Potassium: 3.8 mmol/L (ref 3.5–5.1)
Sodium: 132 mmol/L — ABNORMAL LOW (ref 135–145)

## 2021-05-22 LAB — BPAM RBC
Blood Product Expiration Date: 202303192359
Blood Product Expiration Date: 202303192359
ISSUE DATE / TIME: 202303021820
ISSUE DATE / TIME: 202303030121
Unit Type and Rh: 600
Unit Type and Rh: 600

## 2021-05-22 LAB — CBC WITH DIFFERENTIAL/PLATELET
Abs Immature Granulocytes: 0.53 10*3/uL — ABNORMAL HIGH (ref 0.00–0.07)
Basophils Absolute: 0.1 10*3/uL (ref 0.0–0.1)
Basophils Relative: 0 %
Eosinophils Absolute: 0 10*3/uL (ref 0.0–0.5)
Eosinophils Relative: 0 %
HCT: 26.9 % — ABNORMAL LOW (ref 36.0–46.0)
Hemoglobin: 8.6 g/dL — ABNORMAL LOW (ref 12.0–15.0)
Immature Granulocytes: 3 %
Lymphocytes Relative: 4 %
Lymphs Abs: 0.6 10*3/uL — ABNORMAL LOW (ref 0.7–4.0)
MCH: 28.1 pg (ref 26.0–34.0)
MCHC: 32 g/dL (ref 30.0–36.0)
MCV: 87.9 fL (ref 80.0–100.0)
Monocytes Absolute: 1 10*3/uL (ref 0.1–1.0)
Monocytes Relative: 6 %
Neutro Abs: 15.1 10*3/uL — ABNORMAL HIGH (ref 1.7–7.7)
Neutrophils Relative %: 87 %
Platelets: 133 10*3/uL — ABNORMAL LOW (ref 150–400)
RBC: 3.06 MIL/uL — ABNORMAL LOW (ref 3.87–5.11)
RDW: 19.1 % — ABNORMAL HIGH (ref 11.5–15.5)
WBC: 17.4 10*3/uL — ABNORMAL HIGH (ref 4.0–10.5)
nRBC: 0 % (ref 0.0–0.2)

## 2021-05-22 MED ORDER — METOCLOPRAMIDE HCL 5 MG/ML IJ SOLN
10.0000 mg | Freq: Two times a day (BID) | INTRAMUSCULAR | Status: AC
  Administered 2021-05-22 (×2): 10 mg via INTRAVENOUS
  Filled 2021-05-22 (×2): qty 2

## 2021-05-22 MED ORDER — SODIUM CHLORIDE 0.9 % IV SOLN: INTRAVENOUS | Status: DC

## 2021-05-22 NOTE — NC FL2 (Signed)
[place] LEVEL OF CARE SCREENING TOOL     IDENTIFICATION  Patient Name: [name] Birthdate: 13-Jun-1945 Sex: female Admission Date (Current Location): 05/17/2021  [facility] and [place] Number:  Herbalist and Address:  [facility],  [place], [place]      Provider Number: [id number]  Attending Physician Name and Address:  Mariel Aloe, MD  Relative Name and Phone Number:       Current Level of Care: Hospital Recommended Level of Care: [place] Prior Approval Number:    Date Approved/Denied:   PASRR Number: [id number]  Discharge Plan: SNF    Current Diagnoses: Patient Active Problem List   Diagnosis Date Noted   Pressure injury of skin 05/19/2021   Right upper lobe consolidation ([place]) 05/19/2021   Acute on chronic respiratory failure with hypoxemia ([place]) 05/19/2021   Community acquired pneumonia of right lung    Symptomatic anemia 06/04/2021   Multifocal pneumonia 05/25/2021   Recurrent falls 05/17/2021   Leukocytosis 05/02/2021   Hematoma 08/07/2019   Other neutropenia ([place])    Post-procedural fever 07/20/2019   Acute febrile illness 07/16/2019   Pulmonary MAI (mycobacterium avium-intracellulare) infection ([place]) 06/24/2019   History of total right hip replacement 05/27/2019   NICM (nonischemic cardiomyopathy) ([place]) 05/19/2019   Educated about COVID-19 virus infection 09/07/2018   Erythropoietin deficiency anemia 06/19/2018   Swelling of calf 02/04/2018   Bad odor of urine 12/09/2017   Iron deficiency anemia due to chronic blood loss 08/14/2017   Iron malabsorption 08/14/2017   Hyperlipidemia LDL goal <100 07/08/2017   Elevated ALT measurement    Hypotension 06/20/2017   HLD (hyperlipidemia) 06/20/2017   CAD (coronary artery disease) 06/20/2017   Protein-calorie malnutrition, severe ([place]) 06/20/2017   Generalized weakness    Weakness 05/30/2017   Focal neurological deficit  05/30/2017   Chronic diastolic CHF (congestive heart failure) ([place]) 05/30/2017   TIA (transient ischemic attack) 05/30/2017   AKI (acute kidney injury) ([place]) 05/30/2017   Abnormal LFTs 05/30/2017   Bronchiectasis ([place]) 04/22/2017   Right otitis media 07/17/2016   Cerumen impaction 07/17/2016   Osteoporosis 03/16/2016   Headache 11/20/2014   Skin infection 05/27/2014   Insomnia 05/27/2014   Abdominal wall pain 07/09/2013   Other dysphagia 02/02/2013   Platelets decreased ([place]) 01/29/2012   Vitamin D deficiency 12/13/2011   Hypokalemia 12/13/2011   Hypocalcemia 12/12/2011   Adhesions of intestine, very dense, with frozen abdomen 12/11/2011   SBO (small bowel obstruction), recurrent 12/11/2011   Protein-calorie malnutrition, moderate ([place]) 12/11/2011   Oxygen dependent    Hx of gastric ulcer    UTI (urinary tract infection) 10/15/2011   Allergic conjunctivitis 10/15/2011   Degenerative arthritis of hip 08/03/2011   B12 deficiency 07/02/2011   Bilateral leg weakness 01/16/2011   Anemia, chronic disease 09/14/2010   Recurrent pneumonia 08/11/2010   CRT- [employer] 06/26/2010   DYSURIA 05/19/2010   Chronic hypoxemic respiratory failure ([place]) 02/21/2010   DEHYDRATION 01/06/2010   GASTROENTERITIS 01/06/2010   Congestive dilated cardiomyopathy ([place]) 11/17/2009   LBBB 38/12/1749   SYSTOLIC HEART FAILURE, CHRONIC 11/17/2009   GERD (gastroesophageal reflux disease) 02/17/2009   NONSPECIFIC ABN FINDNG RAD&OTH EXAM BILARY TRCT 09/03/2008   COPD with emphysema ([place]) 06/28/2008   DIVERTICULOSIS, COLON 06/03/2008   GASTRITIS 06/02/2008   Atrial fibrillation/flutter 12/08/2007   GASTROPARESIS 08/07/2007   NAUSEA, CHRONIC 08/07/2007   Abdominal pain 08/07/2007   SYNCOPE 02/17/2007   RESTLESS LEG SYNDROME 08/01/2006  Chronic pain syndrome 08/01/2006   OSTEOARTHRITIS 07/31/2006   Degenerative disc disease, lumbar 07/31/2006   Fibromyalgia 07/31/2006   CHRONIC FATIGUE SYNDROME  07/31/2006   URINARY INCONTINENCE 07/31/2006   ANEMIA-IRON DEFICIENCY 05/01/2006   Essential hypertension 05/01/2006    Orientation RESPIRATION BLADDER Height & Weight     Self, Time, Situation, Place  O2 Incontinent Weight: 41.6 kg Height:  5' (152.4 cm)  BEHAVIORAL SYMPTOMS/MOOD NEUROLOGICAL BOWEL NUTRITION STATUS      Incontinent Diet  AMBULATORY STATUS COMMUNICATION OF NEEDS Skin   Extensive Assist Verbally Bruising                       Personal Care Assistance Level of Assistance  Bathing, Feeding, Dressing Bathing Assistance: Limited assistance Feeding assistance: Independent Dressing Assistance: Limited assistance     Functional Limitations Info  Sight, Hearing, Speech Sight Info: Impaired Hearing Info: Adequate Speech Info: Adequate    SPECIAL CARE FACTORS FREQUENCY  OT (By licensed OT), PT (By licensed PT)     PT Frequency: 5 x weekly OT Frequency: 5 x weekly            Contractures Contractures Info: Not present    Additional Factors Info  Code Status, Allergies Code Status Info: Full Allergies Info: Dabigatran Etexilate Mesylate, Augmentin, [place]           Current Medications (05/22/2021):  This is the current hospital active medication list Current Facility-Administered Medications  Medication Dose Route Frequency Provider Last Rate Last Admin   0.9 %  sodium chloride infusion   Intravenous Continuous Mariel Aloe, MD 75 mL/hr at 05/22/21 1010 New Bag at 05/22/21 1010   oxyCODONE (Oxy IR/ROXICODONE) immediate release tablet 10 mg  10 mg Oral Q4H PRN Mariel Aloe, MD   10 mg at 05/22/21 1155   And   acetaminophen (TYLENOL) tablet 325 mg  325 mg Oral Q4H PRN Mariel Aloe, MD   325 mg at 05/21/21 2134   albuterol (PROVENTIL) (2.5 MG/3ML) 0.083% nebulizer solution 2.5 mg  2.5 mg Nebulization Q6H PRN Mariel Aloe, MD       ascorbic acid (VITAMIN C) tablet 250 mg  250 mg Oral BID Mariel Aloe, MD   250 mg at 05/22/21 1010    atorvastatin (LIPITOR) tablet 20 mg  20 mg Oral QHS Mariel Aloe, MD   20 mg at 05/21/21 2134   Chlorhexidine Gluconate Cloth 2 % PADS 6 each  6 each Topical Daily Mariel Aloe, MD   6 each at 05/22/21 1011   collagenase (SANTYL) ointment   Topical Daily Mariel Aloe, MD   Given at 05/22/21 1011   feeding supplement (BOOST / RESOURCE BREEZE) liquid 1 Container  1 Container Oral BID BM Mariel Aloe, MD   1 Container at 05/22/21 1011   furosemide (LASIX) tablet 40 mg  40 mg Oral Daily Mariel Aloe, MD   40 mg at 05/22/21 1010   gabapentin (NEURONTIN) capsule 300 mg  300 mg Oral BID Mariel Aloe, MD   300 mg at 05/22/21 1009   And   gabapentin (NEURONTIN) capsule 600 mg  600 mg Oral QHS Mariel Aloe, MD   600 mg at 05/21/21 2134   hydrALAZINE (APRESOLINE) injection 10 mg  10 mg Intravenous Q6H PRN Mariel Aloe, MD       levofloxacin (LEVAQUIN) tablet 500 mg  500 mg Oral Daily Tanda Rockers, MD   500  mg at 05/22/21 1010   metoCLOPramide (REGLAN) injection 10 mg  10 mg Intravenous BID Ellouise Newer [place], [place]   10 mg at 05/22/21 1011   morphine (PF) 4 MG/ML injection 4 mg  4 mg Intravenous Q4H PRN Mariel Aloe, MD   4 mg at 05/22/21 0502   multivitamin with minerals tablet 1 tablet  1 tablet Oral Daily Mariel Aloe, MD   1 tablet at 05/22/21 1009   nutrition supplement (JUVEN) (JUVEN) powder packet 1 packet  1 packet Oral BID BM Mariel Aloe, MD   1 packet at 05/22/21 1156   ondansetron (ZOFRAN) tablet 4 mg  4 mg Oral Q6H PRN Mariel Aloe, MD       Or   ondansetron [facility]) injection 4 mg  4 mg Intravenous Q6H PRN Mariel Aloe, MD   4 mg at 05/22/21 0502   pantoprazole (PROTONIX) EC tablet 40 mg  40 mg Oral Daily Juanita Craver, MD   40 mg at 05/22/21 1010   sodium chloride flush (NS) 0.9 % injection 10-40 mL  10-40 mL Intracatheter Q12H Mariel Aloe, MD   10 mL at 05/22/21 1011   sodium chloride flush (NS) 0.9 % injection 10-40 mL  10-40 mL Intracatheter PRN  Mariel Aloe, MD       umeclidinium bromide (INCRUSE ELLIPTA) 62.5 MCG/ACT 1 puff  1 puff Inhalation Daily Mariel Aloe, MD   1 puff at 05/22/21 0753   zinc sulfate capsule 220 mg  220 mg Oral Daily Mariel Aloe, MD   220 mg at 05/21/21 2114   zolpidem (AMBIEN) tablet 5 mg  5 mg Oral QHS PRN Mariel Aloe, MD   5 mg at 05/19/21 2115     Discharge Medications: Please see discharge summary for a list of discharge medications.  Relevant Imaging Results:  Relevant Lab Results:   Additional Information [id number]  Delsa Walder, Marjie Skiff, RN

## 2021-05-22 NOTE — H&P (View-Only) (Signed)
? ?   Progress Note ? ? Subjective  ?Chief Complaint:Anemia ? ?Passed by the patient's room this morning to see if she was prepped for her colonoscopy scheduled later today.  She only drank about a half of the bowel prep.  Tells me that her bowel movements are watery but still brown looking.  She is complaining of increased weakness and nausea.  Tells me the nursing staff told her Zofran was not due again until 11 so they have not given her anything else.  She would really like to try and get the colonoscopy done today if possible. ? ? Objective  ? ?Vital signs in last 24 hours: ?Temp:  [97.7 ?F (36.5 ?C)-98.1 ?F (36.7 ?C)] 98 ?F (36.7 ?C) (03/06 0439) ?Pulse Rate:  [105-123] 105 (03/06 0439) ?Resp:  [16-18] 16 (03/06 0439) ?BP: (105-142)/(63-88) 135/74 (03/06 0439) ?SpO2:  [94 %-99 %] 97 % (03/06 0753) ?Weight:  [41.6 kg] 41.6 kg (03/06 0439) ?Last BM Date : 05/21/21 ?General:    Elderly, pale, frail appearing, white female in NAD ?Heart:  Regular rate and rhythm; no murmurs ?Lungs: Respirations even and unlabored, lungs CTA bilaterally ?Abdomen:  Soft, nontender and nondistended. Normal bowel sounds. ?Psych:  Cooperative. Normal mood and affect. ? ?Lab Results: ?Recent Labs  ?  06/16/2021 ?0436 05/21/21 ?1054 05/22/21 ?0515  ?WBC 16.3* 20.3* 17.4*  ?HGB 10.2* 9.6* 8.6*  ?HCT 31.9* 30.2* 26.9*  ?PLT 255 146* 133*  ? ?BMET ?Recent Labs  ?  05/21/21 ?1054 05/22/21 ?0515  ?NA 132* 132*  ?K 3.7 3.8  ?CL 93* 94*  ?CO2 30 31  ?GLUCOSE 155* 106*  ?BUN 28* 29*  ?CREATININE 0.50 0.47  ?CALCIUM 9.2 8.8*  ? ? ? Assessment / Plan:   ?Assessment: ?1.  Acute on chronic anemia: EGD unrevealing, patient was prepping for colonoscopy today but was unable to finish prep ?2.  Dysphagia: No strictures or esophagitis on EGD; likely element of dysmotility ?3.  A-fib on Eliquis at home: Eliquis on hold ?4.  COPD/emphysema/chronic respiratory failure: On chronic oxygen ?5.  Multifocal pneumonia on antibiotics ?6.  Chronic back pain ?7.  CHF ? ?Plan: ?1.  Discussed with Dr. Tarri Glenn.  We are going to wait until tomorrow to do the patient's colonoscopy in order to reduce risk of possibly having to put the patient under anesthesia twice.  Explained that we will start Reglan which can be scheduled out twice during her bowel prep today.  Encouraged her to take small sips throughout the day so that we can try and get her clear. ?2.  Patient has second half of bowel prep in her room.  She needs to finish this before tomorrow. ?3.  Patient can be on clears and n.p.o. at midnight ?4.  Ordered Reglan 10 mg IV twice ?5.  Continue to monitor hemoglobin with transfusion as needed less than 7 ? ?Thank you for kind consultation, we will continue to follow. ? ? LOS: 3 days  ? ?[name]  05/22/2021, 9:08 AM ? ?Attending Attestation: ? ?I saw and evaluated the patient. Her husband was present at the time of my evaluation.  I agree with the Advanced Practitioner's note, impression and recommendations. Rescheduling colonoscopy for tomorrow to allow for additional purgative today with the use of antiemetics.   ? ?Thornton Park, MD, MPH ?[facility]   ?

## 2021-05-22 NOTE — Progress Notes (Addendum)
? ?   Progress Note ? ? Subjective  ?Chief Complaint:Anemia ? ?Passed by the patient's room this morning to see if she was prepped for her colonoscopy scheduled later today.  She only drank about a half of the bowel prep.  Tells me that her bowel movements are watery but still brown looking.  She is complaining of increased weakness and nausea.  Tells me the nursing staff told her Zofran was not due again until 11 so they have not given her anything else.  She would really like to try and get the colonoscopy done today if possible. ? ? Objective  ? ?Vital signs in last 24 hours: ?Temp:  [97.7 ?F (36.5 ?C)-98.1 ?F (36.7 ?C)] 98 ?F (36.7 ?C) (03/06 0439) ?Pulse Rate:  [105-123] 105 (03/06 0439) ?Resp:  [16-18] 16 (03/06 0439) ?BP: (105-142)/(63-88) 135/74 (03/06 0439) ?SpO2:  [94 %-99 %] 97 % (03/06 0753) ?Weight:  [41.6 kg] 41.6 kg (03/06 0439) ?Last BM Date : 05/21/21 ?General:    Elderly, pale, frail appearing, white female in NAD ?Heart:  Regular rate and rhythm; no murmurs ?Lungs: Respirations even and unlabored, lungs CTA bilaterally ?Abdomen:  Soft, nontender and nondistended. Normal bowel sounds. ?Psych:  Cooperative. Normal mood and affect. ? ?Lab Results: ?Recent Labs  ?  06/09/2021 ?0436 05/21/21 ?1054 05/22/21 ?0515  ?WBC 16.3* 20.3* 17.4*  ?HGB 10.2* 9.6* 8.6*  ?HCT 31.9* 30.2* 26.9*  ?PLT 255 146* 133*  ? ?BMET ?Recent Labs  ?  05/21/21 ?1054 05/22/21 ?0515  ?NA 132* 132*  ?K 3.7 3.8  ?CL 93* 94*  ?CO2 30 31  ?GLUCOSE 155* 106*  ?BUN 28* 29*  ?CREATININE 0.50 0.47  ?CALCIUM 9.2 8.8*  ? ? ? Assessment / Plan:   ?Assessment: ?1.  Acute on chronic anemia: EGD unrevealing, patient was prepping for colonoscopy today but was unable to finish prep ?2.  Dysphagia: No strictures or esophagitis on EGD; likely element of dysmotility ?3.  A-fib on Eliquis at home: Eliquis on hold ?4.  COPD/emphysema/chronic respiratory failure: On chronic oxygen ?5.  Multifocal pneumonia on antibiotics ?6.  Chronic back pain ?7.  CHF ? ?Plan: ?1.  Discussed with Dr. Tarri Glenn.  We are going to wait until tomorrow to do the patient's colonoscopy in order to reduce risk of possibly having to put the patient under anesthesia twice.  Explained that we will start Reglan which can be scheduled out twice during her bowel prep today.  Encouraged her to take small sips throughout the day so that we can try and get her clear. ?2.  Patient has second half of bowel prep in her room.  She needs to finish this before tomorrow. ?3.  Patient can be on clears and n.p.o. at midnight ?4.  Ordered Reglan 10 mg IV twice ?5.  Continue to monitor hemoglobin with transfusion as needed less than 7 ? ?Thank you for kind consultation, we will continue to follow. ? ? LOS: 3 days  ? ?[name]  05/22/2021, 9:08 AM ? ?Attending Attestation: ? ?I saw and evaluated the patient. Her husband was present at the time of my evaluation.  I agree with the Advanced Practitioner's note, impression and recommendations. Rescheduling colonoscopy for tomorrow to allow for additional purgative today with the use of antiemetics.   ? ?Thornton Park, MD, MPH ?[facility]   ?

## 2021-05-22 NOTE — TOC Initial Note (Signed)
Transition of Care (TOC) - Initial/Assessment Note  ? ? ?Patient Details  ?Name: [name] ?MRN: [id number] ?Date of Birth: 1945-04-16 ? ?Transition of Care (TOC) CM/SW Contact:    ?Patric Vanpelt, Marjie Skiff, RN ?Phone Number: ?05/22/2021, 1:21 PM ? ?Clinical Narrative:                 ?Spoke with pt and husband at bedside for dc planning. Agreeable to SNF placement at dc. Permission received to fax out FL2. Insurance auth to be started with [facility] for SNF and PTAR. TOC will continue to follow. ? ?Expected Discharge Plan: [place] ?Barriers to Discharge: Continued Medical Work up ? ? ?Patient Goals and CMS Choice ?Patient states their goals for this hospitalization and ongoing recovery are:: To go home ?  ?  ? ?Expected Discharge Plan and Services ?Expected Discharge Plan: [place] ?  ?Discharge Planning Services: CM Consult ?  ?Living arrangements for the past 2 months: [place] ?                ?  ?  ?  ?  ?  ?  ?  ?  ?  ?  ? ?Prior Living Arrangements/Services ?Living arrangements for the past 2 months: [place] ?Lives with:: Spouse ?Patient language and need for interpreter reviewed:: Yes ?       ?Need for Family Participation in Patient Care: Yes (Comment) ?Care giver support system in place?: Yes (comment) ?  ?Criminal Activity/Legal Involvement Pertinent to Current Situation/Hospitalization: No - Comment as needed ? ?Activities of Daily Living ?  ?  ? ?Permission Sought/Granted ?  ?Permission granted to share information with : Yes, Verbal Permission Granted ?   ? Permission granted to share info w AGENCY: Faxed out in Hub ?   ?   ? ?Emotional Assessment ?Appearance:: Appears stated age ?Attitude/Demeanor/Rapport: Engaged ?Affect (typically observed): Stable ?Orientation: : Oriented to Self, Oriented to Place, Oriented to  Time, Oriented to Situation ?Alcohol / Substance Use: Not Applicable ?Psych Involvement: No (comment) ? ?Admission diagnosis:  Acute upper  gastrointestinal bleeding [K92.2] ?Symptomatic anemia [D64.9] ?Anemia, unspecified type [D64.9] ?Community acquired pneumonia of right lung, unspecified part of lung [J18.9] ?Patient Active Problem List  ? Diagnosis Date Noted  ? Pressure injury of skin 05/19/2021  ? Right upper lobe consolidation ([place]) 05/19/2021  ? Acute on chronic respiratory failure with hypoxemia ([place]) 05/19/2021  ? Community acquired pneumonia of right lung   ? Symptomatic anemia 06/06/2021  ? Multifocal pneumonia 05/17/2021  ? Recurrent falls 05/25/2021  ? Leukocytosis 05/02/2021  ? Hematoma 08/07/2019  ? Other neutropenia ([place])   ? Post-procedural fever 07/20/2019  ? Acute febrile illness 07/16/2019  ? Pulmonary MAI (mycobacterium avium-intracellulare) infection ([place]) 06/24/2019  ? History of total right hip replacement 05/27/2019  ? NICM (nonischemic cardiomyopathy) ([place]) 05/19/2019  ? Educated about COVID-19 virus infection 09/07/2018  ? Erythropoietin deficiency anemia 06/19/2018  ? Swelling of calf 02/04/2018  ? Bad odor of urine 12/09/2017  ? Iron deficiency anemia due to chronic blood loss 08/14/2017  ? Iron malabsorption 08/14/2017  ? Hyperlipidemia LDL goal <100 07/08/2017  ? Elevated ALT measurement   ? Hypotension 06/20/2017  ? HLD (hyperlipidemia) 06/20/2017  ? CAD (coronary artery disease) 06/20/2017  ? Protein-calorie malnutrition, severe ([place]) 06/20/2017  ? Generalized weakness   ? Weakness 05/30/2017  ? Focal neurological deficit 05/30/2017  ? Chronic diastolic CHF (congestive heart failure) ([place]) 05/30/2017  ? TIA (transient ischemic attack) 05/30/2017  ?  AKI (acute kidney injury) ([place]) 05/30/2017  ? Abnormal LFTs 05/30/2017  ? Bronchiectasis ([place]) 04/22/2017  ? Right otitis media 07/17/2016  ? Cerumen impaction 07/17/2016  ? Osteoporosis 03/16/2016  ? Headache 11/20/2014  ? Skin infection 05/27/2014  ? Insomnia 05/27/2014  ? Abdominal wall pain 07/09/2013  ? Other dysphagia 02/02/2013  ? Platelets decreased ([place]) 01/29/2012   ? Vitamin D deficiency 12/13/2011  ? Hypokalemia 12/13/2011  ? Hypocalcemia 12/12/2011  ? Adhesions of intestine, very dense, with frozen abdomen 12/11/2011  ? SBO (small bowel obstruction), recurrent 12/11/2011  ? Protein-calorie malnutrition, moderate ([place]) 12/11/2011  ? Oxygen dependent   ? Hx of gastric ulcer   ? UTI (urinary tract infection) 10/15/2011  ? Allergic conjunctivitis 10/15/2011  ? Degenerative arthritis of hip 08/03/2011  ? B12 deficiency 07/02/2011  ? Bilateral leg weakness 01/16/2011  ? Anemia, chronic disease 09/14/2010  ? Recurrent pneumonia 08/11/2010  ? CRT- [employer] 06/26/2010  ? DYSURIA 05/19/2010  ? Chronic hypoxemic respiratory failure ([place]) 02/21/2010  ? DEHYDRATION 01/06/2010  ? GASTROENTERITIS 01/06/2010  ? Congestive dilated cardiomyopathy ([place]) 11/17/2009  ? LBBB 11/17/2009  ? SYSTOLIC HEART FAILURE, CHRONIC 11/17/2009  ? GERD (gastroesophageal reflux disease) 02/17/2009  ? NONSPECIFIC ABN FINDNG RAD&OTH EXAM BILARY TRCT 09/03/2008  ? COPD with emphysema ([place]) 06/28/2008  ? DIVERTICULOSIS, COLON 06/03/2008  ? GASTRITIS 06/02/2008  ? Atrial fibrillation/flutter 12/08/2007  ? GASTROPARESIS 08/07/2007  ? NAUSEA, CHRONIC 08/07/2007  ? Abdominal pain 08/07/2007  ? SYNCOPE 02/17/2007  ? RESTLESS LEG SYNDROME 08/01/2006  ? Chronic pain syndrome 08/01/2006  ? OSTEOARTHRITIS 07/31/2006  ? Degenerative disc disease, lumbar 07/31/2006  ? Fibromyalgia 07/31/2006  ? CHRONIC FATIGUE SYNDROME 07/31/2006  ? URINARY INCONTINENCE 07/31/2006  ? ANEMIA-IRON DEFICIENCY 05/01/2006  ? Essential hypertension 05/01/2006  ? ?PCP:  Ann Held, DO ?Pharmacy:   ?[place], [place] - [address] ?[place] ?[place] [address] ?Phone: [phone] Fax: [phone] ? ? ? ? ?Social Determinants of Health (SDOH) Interventions ?  ? ?Readmission Risk Interventions ?No flowsheet data found. ? ? ?

## 2021-05-22 NOTE — Care Management Important Message (Signed)
Important Message ? ?Patient Details IM Letter placed in Patients room. ?Name: [name] ?MRN: [id number] ?Date of Birth: 01/08/1946 ? ? ?Medicare Important Message Given:  Yes ? ? ? ? ?Kerin Salen ?05/22/2021, 9:47 AM ?

## 2021-05-22 NOTE — Progress Notes (Signed)
Physical Therapy Treatment ?Patient Details ?Name: [name] ?MRN: [id number] ?DOB: June 02, 1945 ?Today's Date: 05/22/2021 ? ? ?History of Present Illness Patient is a 76 year old female admitted on 06/03/2021 with worsening back pain and weakness as well as anemia hemoglobin of 6 (has received PRBC) and PNE. CT chest concerning for RUL consolidation concerning for mass. Pt had EGD 05/21/21 and plan for colonscopy 05/28/2021.  PMH:  atrial fibrillation/flutter on Eliquis, CAD, COPD, recent wrist fracture, s/p PPM, MAI infection, chronic pain, bronchiectasis, chronic CHF ? ?  ?PT Comments  ? ? Pt making slow progress.  She was still very weak, reports just generally feeling bad, weak, dizzy, and hurting all over (back mostly).  She tolerated sitting EOB 10 mins.  Had assist of 2 for transfers for safety.  Continue to progress as able.  ?   ?Recommendations for follow up therapy are one component of a multi-disciplinary discharge planning process, led by the attending physician.  Recommendations may be updated based on patient status, additional functional criteria and insurance authorization. ? ?Follow Up Recommendations ? Skilled nursing-short term rehab (<3 hours/day) ?  ?  ?Assistance Recommended at Discharge Frequent or constant Supervision/Assistance  ?Patient can return home with the following A lot of help with bathing/dressing/bathroom;A lot of help with walking and/or transfers;Assistance with cooking/housework ?  ?Equipment Recommendations ? Wheelchair (measurements PT);Wheelchair cushion (measurements PT);BSC/3in1  ?  ?Recommendations for Other Services   ? ? ?  ?Precautions / Restrictions Precautions ?Precautions: Fall  ?  ? ?Mobility ? Bed Mobility ?Overal bed mobility: Needs Assistance ?Bed Mobility: Sidelying to Sit, Sit to Sidelying ?  ?Sidelying to sit: Mod assist, +2 for safety/equipment ?  ?  ?Sit to sidelying: Mod assist, +2 for safety/equipment ?General bed mobility comments: Cues for all aspects, assist  with legs and trunk ?  ? ?Transfers ?Overall transfer level: Needs assistance ?Equipment used: 2 person hand held assist ?Transfers: Sit to/from Stand ?Sit to Stand: +2 physical assistance, Mod assist ?  ?  ?  ?  ?  ?General transfer comment: Sit to stand with gait belt and bed pad to facilitate ?  ? ?Ambulation/Gait ?  ?  ?  ?  ?  ?  ?  ?General Gait Details: Unable ? ? ?Stairs ?  ?  ?  ?  ?  ? ? ?Wheelchair Mobility ?  ? ?Modified Rankin (Stroke Patients Only) ?  ? ? ?  ?Balance Overall balance assessment: Needs assistance, History of Falls ?Sitting-balance support: Feet supported, No upper extremity supported ?Sitting balance-Leahy Scale: Fair ?Sitting balance - Comments: Pt maintaining balance but with slumped posture and hanging her head - generally due to fatigue and not feeling well, could correct partially with cues.  Sat EOB for ~10 mins. ?  ?Standing balance support: Bilateral upper extremity supported ?Standing balance-Leahy Scale: Poor ?Standing balance comment: requiring mod A x 2 ?  ?  ?  ?  ?  ?  ?  ?  ?  ?  ?  ?  ? ?  ?Cognition Arousal/Alertness: Awake/alert ?Behavior During Therapy: Flat affect ?Overall Cognitive Status: Within Functional Limits for tasks assessed ?  ?  ?  ?  ?  ?  ?  ?  ?  ?  ?  ?  ?  ?  ?  ?  ?  ?  ?  ? ?  ?Exercises General Exercises - Lower Extremity ?Ankle Circles/Pumps: AROM, Both, 10 reps, Seated ?Long Arc Quad: AAROM, Both, 10 reps,  Seated ? ?  ?General Comments General comments (skin integrity, edema, etc.): Pt's spouse present and encouraging her to work with therapy. ? ?Pt reports dizziness with sitting and with all movement.  Orthostatics were stable at prior visit, not reassessed due to similar symptoms.  Hgb 8.6 today. Pt not able to tolerate vestibular testing due to weakness/fatigue - symptoms not seeming consistent with vestibular with other medical issues at this time likely contributing.  ?  ? ?Pertinent Vitals/Pain Pain Assessment ?Pain Assessment: Faces ?Faces  Pain Scale: Hurts even more ?Pain Location: Back ?Pain Descriptors / Indicators: Discomfort, Sore (some chronic pain but exacerbated; spouse reports arthritis L4-5 and fibromyalgia) ?Pain Intervention(s): Limited activity within patient's tolerance, Monitored during session  ? ? ?Home Living   ?  ?  ?  ?  ?  ?  ?  ?  ?  ?   ?  ?Prior Function    ?  ?  ?   ? ?PT Goals (current goals can now be found in the care plan section) Progress towards PT goals: Progressing toward goals ? ?  ?Frequency ? ? ? Min 2X/week ? ? ? ?  ?PT Plan Current plan remains appropriate  ? ? ?Co-evaluation   ?  ?  ?  ?  ? ?  ?AM-PAC PT "6 Clicks" Mobility   ?Outcome Measure ? Help needed turning from your back to your side while in a flat bed without using bedrails?: A Little ?Help needed moving from lying on your back to sitting on the side of a flat bed without using bedrails?: A Lot ?Help needed moving to and from a bed to a chair (including a wheelchair)?: Total ?Help needed standing up from a chair using your arms (e.g., wheelchair or bedside chair)?: Total ?Help needed to walk in hospital room?: Total ?Help needed climbing 3-5 steps with a railing? : Total ?6 Click Score: 9 ? ?  ?End of Session Equipment Utilized During Treatment: Gait belt ?Activity Tolerance: Patient limited by fatigue;Patient limited by pain ?Patient left: in bed;with call bell/phone within reach;with bed alarm set;with family/visitor present ?Nurse Communication: Mobility status ?PT Visit Diagnosis: Other abnormalities of gait and mobility (R26.89);Muscle weakness (generalized) (M62.81) ?  ? ? ?Time: 1220-1240 ?PT Time Calculation (min) (ACUTE ONLY): 20 min ? ?Charges:  $Therapeutic Activity: 8-22 mins          ?          ? ?Abran Richard, PT ?Acute Rehab Services ?Pager [phone] ?[facility] [phone] ? ? ? ?Mikael Spray [name] ?05/22/2021, 1:47 PM ? ?

## 2021-05-22 NOTE — Progress Notes (Signed)
PROGRESS NOTE    [name]  DBZ:[id number] DOB: 1945/10/19 DOA: 05/30/2021 PCP: Ann Held, DO   Brief Narrative: [name] is a 76 y.o. female with a history of atrial fibrillation/flutter on Eliquis, CAD, COPD, recent wrist fracture, s/p PPM, MAI infection, chronic pain, bronchiectasis, chronic CHF. Patient presented secondary to worsening back pain and weakness but found to also have a history of melena and concern for upper GI bleeding. FOBT was positive in the ED. GI consulted and patient given 2 units of PRBC for a hemoglobin of 6 with symptoms. Incidentally, concern for multifocal pneumonia on chest x-ray. CT chest concerning for RUL consolidation concerning for mass. Pulmonology consulted with recommendation for outpatient PET CT and have transitioned the patient to Levaquin. Upper endoscopy significant for gastritis. Plan for colonoscopy.   Assessment and Plan: * Symptomatic anemia Secondary to GI bleeding as evidenced by melena and guaiac positive stool test. [facility] consulted on admission. Patient started empirically on Protonix IV drip. Hemoglobin of 6 on admission, down from baseline of about 10. 2 units of PRBC ordered by EDP with rebound hemoglobin of 8.9 >10.2 and is now trending down. GI performed upper endoscopy on 3/4 which was significant for gastritis but no other source for anemia. Patient with no melena over the last 24 hours and has no documentation of hematochezia/melena with bowel prep. -Daily CBC -Follow-up GI recommendations: Continue protonix, plan for colonoscopy on 3/7  Right upper lobe consolidation [facility]) Seen on CT scan, concerning for malignancy, but in setting of MAI. Also noted was a small loculated effusion. Pulmonology consulted with recommendation for outpatient PET CT in 2 weeks and follow-up with pulmonology. Recommendation for no aspiration of right-sided loculated effusion at this time.  Pressure injury of skin Likely related  to recent prolonged immobility. Located on medial sacrum. Wound nurse recommendation (3/3): I will provide the patient with a mattress replacement with low air loss feature today and provide Nursing with guidance to turn from side to side and minimize the supine position. Heels are to be floated. Topical care is with collagenase (Santyl) ointment applied once daily and PRN soiling. This is to be topped with a saline dampened gauze dressing, dry gauze and secured with the silicone foam. The "tip" of the silicone foam is to be oriented away from the anus. Nutrition will need to be maximized to enhance wound healing.  Recurrent falls Unsure of etiology. Possibly related to anemia. Patient is also on gabapentin with low renal function. Back pain is also a barrier to successful mobility. PT recommending SNF. -Consulted TOC for SNF discharge  Multifocal pneumonia Recurrent issues with pneumonia. Patient with a history of MAI not currently on treatment (completed treatment in 2022). Last seen by ID in July 2022. Negative influenza/COVID. Started empirically on Ceftriaxone and azithromycin. Recently complete a 7 day treatment course of Levaquin for pneumonia. History of MAI and bronchiectasis makes this more complicated. Symptoms, per patient, are at baseline. RVP negative. MRSA PCR negative. Strep pneumo urine antigen negative. Pulmonology transitioned to [place] with recommendation for 10 day course -Levaquin 500 mg daily x10 days, per pulmonology recommendations -Sputum culture, urine legionella  Leukocytosis Worsened. Afebrile. In setting of pneumonia. Peak of 20,300 and is now downtrending. -CBC in AM  Pulmonary MAI (mycobacterium avium-intracellulare) infection ([place]) See problem, Multifocal pneumonia  Hyperlipidemia LDL goal <100 -Continue Lipitor  Chronic diastolic CHF (congestive heart failure) (HCC) Stable. Patient is euvolemic. She is on Lasix as an outpatient. -Daily  weights/strict in and  out -Continue home Lasix  Bronchiectasis ([place]) Chronic issue. Patient follows with pulmonology.  Insomnia -Continue Ambien qhs PRN  UTI (urinary tract infection) Diagnosed prior to admission. Urine culture from 2/27 significant for enterococcus faecium, susceptible to ampicillin. Patient received Levaquin for treatment. Unsure if this regimen would have treated infection. Since patient has no symptoms, repeat urine culture ordered. If repeat urine culture is positive, will treat. -Follow-up urine culture  Anemia, chronic disease Patient follows with hematology/oncology, Dr. Marin Olp. Right port placed in upper chest. Patient receives intermittent blood transfusions. Baseline hemoglobin of about 10.  Chronic hypoxemic respiratory failure (HCC) -Continue home oxygen at 4 L/min  Fibromyalgia See problem, Chronic pain syndrome  COPD with emphysema (HCC) -Continue Incruse Ellipta and albuterol  Atrial fibrillation/flutter Currently seems to be in sinus rhythm with increased rate. Patient is on Eliquis for stroke prevention. She is not on rate or rhythm control medication. -Telemetry while having acute GI bleeding  Essential hypertension Currently mostly normotensive. Patient is on amlodipine as an outpatient. -Hold amlodipine for now in setting of acute GI bleeding and being normotensive currently  Chronic pain syndrome -Continue home Percocet and gabapentin (decrease dose for renal function)    DVT prophylaxis: SCDs Code Status:   Code Status: Full Code Family Communication: None at bedside Disposition Plan: Discharge home likely in 3-5 days pending GI recommendations/management, stable hemoglobin, improvement of GI bleeding and pulmonology recommendations   Consultants:  [facility]  Procedures:  Upper endoscopy (3/4)  Antimicrobials: Ceftriaxone Azithromycin    Subjective: Continues to feel weak. Not eating much, per husband. Completed half of her  bowel prep. Multiple bowel movements overnight.  Objective: BP 135/74 (BP Location: Left Arm)    Pulse (!) 105    Temp 98 F (36.7 C) (Oral)    Resp 16    Ht 5' (1.524 m)    Wt 41.6 kg    SpO2 97%    BMI 17.91 kg/m   Examination:  General exam: Appears calm and comfortable Respiratory system: Clear to auscultation. Respiratory effort normal. Cardiovascular system: S1 & S2 heard, normal rhythm with tachycardia Gastrointestinal system: Abdomen is nondistended, soft and nontender. No organomegaly or masses felt. Normal bowel sounds heard. Central nervous system: Alert and oriented. No focal neurological deficits. Musculoskeletal: No edema. No calf tenderness Skin: No cyanosis. No rashes Psychiatry: Judgement and insight appear normal.    Data Reviewed: I have personally reviewed following labs and imaging studies  CBC Lab Results  Component Value Date   WBC 17.4 (H) 05/22/2021   RBC 3.06 (L) 05/22/2021   HGB 8.6 (L) 05/22/2021   HCT 26.9 (L) 05/22/2021   MCV 87.9 05/22/2021   MCH 28.1 05/22/2021   PLT 133 (L) 05/22/2021   MCHC 32.0 05/22/2021   RDW 19.1 (H) 05/22/2021   LYMPHSABS 0.6 (L) 05/22/2021   MONOABS 1.0 05/22/2021   EOSABS 0.0 05/22/2021   BASOSABS 0.1 56/38/7564     Last metabolic panel Lab Results  Component Value Date   NA 132 (L) 05/22/2021   K 3.8 05/22/2021   CL 94 (L) 05/22/2021   CO2 31 05/22/2021   BUN 29 (H) 05/22/2021   CREATININE 0.47 05/22/2021   GLUCOSE 106 (H) 05/22/2021   GFRNONAA >60 05/22/2021   GFRAA >60 12/01/2019   CALCIUM 8.8 (L) 05/22/2021   PHOS 3.0 05/17/2014   PROT 6.7 06/02/2021   ALBUMIN 2.9 (L) 06/15/2021   BILITOT 1.2 06/07/2021   ALKPHOS 115 06/15/2021  AST 52 (H) 05/19/2021   ALT 12 06/16/2021   ANIONGAP 7 05/22/2021    GFR: Estimated Creatinine Clearance: 39.3 mL/min (by C-G formula based on SCr of 0.47 mg/dL).  Recent Results (from the past 240 hour(s))  Resp Panel by RT-PCR (Flu A&B, Covid) Nasopharyngeal  Swab     Status: None   Collection Time: 05/21/2021  4:05 PM   Specimen: Nasopharyngeal Swab; Nasopharyngeal(NP) swabs in vial transport medium  Result Value Ref Range Status   SARS Coronavirus 2 by RT PCR NEGATIVE NEGATIVE Final    Comment: (NOTE) SARS-CoV-2 target nucleic acids are NOT DETECTED.  The SARS-CoV-2 RNA is generally detectable in upper respiratory specimens during the acute phase of infection. The lowest concentration of SARS-CoV-2 viral copies this assay can detect is 138 copies/mL. A negative result does not preclude SARS-Cov-2 infection and should not be used as the sole basis for treatment or other patient management decisions. A negative result may occur with  improper specimen collection/handling, submission of specimen other than nasopharyngeal swab, presence of viral mutation(s) within the areas targeted by this assay, and inadequate number of viral copies(<138 copies/mL). A negative result must be combined with clinical observations, patient history, and epidemiological information. The expected result is Negative.  Fact Sheet for Patients:  EntrepreneurPulse.com.au  Fact Sheet for Healthcare Providers:  IncredibleEmployment.be  This test is no t yet approved or cleared by the [place] FDA and  has been authorized for detection and/or diagnosis of SARS-CoV-2 by FDA under an Emergency Use Authorization (EUA). This EUA will remain  in effect (meaning this test can be used) for the duration of the COVID-19 declaration under Section 564(b)(1) of the Act, 21 U.S.C.section 360bbb-3(b)(1), unless the authorization is terminated  or revoked sooner.       Influenza A by PCR NEGATIVE NEGATIVE Final   Influenza B by PCR NEGATIVE NEGATIVE Final    Comment: (NOTE) The Xpert Xpress SARS-CoV-2/FLU/RSV plus assay is intended as an aid in the diagnosis of influenza from Nasopharyngeal swab specimens and should not be used as a sole  basis for treatment. Nasal washings and aspirates are unacceptable for Xpert Xpress SARS-CoV-2/FLU/RSV testing.  Fact Sheet for Patients: EntrepreneurPulse.com.au  Fact Sheet for Healthcare Providers: IncredibleEmployment.be  This test is not yet approved or cleared by the [place] FDA and has been authorized for detection and/or diagnosis of SARS-CoV-2 by FDA under an Emergency Use Authorization (EUA). This EUA will remain in effect (meaning this test can be used) for the duration of the COVID-19 declaration under Section 564(b)(1) of the Act, 21 U.S.C. section 360bbb-3(b)(1), unless the authorization is terminated or revoked.  Performed at [facility], [place] [address] [place], [place] [address]   Respiratory (~20 pathogens) panel by PCR     Status: None   Collection Time: 05/17/2021  4:05 PM   Specimen: Nasopharyngeal Swab; Respiratory  Result Value Ref Range Status   Adenovirus NOT DETECTED NOT DETECTED Final   Coronavirus 229E NOT DETECTED NOT DETECTED Final    Comment: (NOTE) The Coronavirus on the Respiratory Panel, DOES NOT test for the novel  Coronavirus (2019 nCoV)    Coronavirus HKU1 NOT DETECTED NOT DETECTED Final   Coronavirus NL63 NOT DETECTED NOT DETECTED Final   Coronavirus OC43 NOT DETECTED NOT DETECTED Final   Metapneumovirus NOT DETECTED NOT DETECTED Final   Rhinovirus / Enterovirus NOT DETECTED NOT DETECTED Final   Influenza A NOT DETECTED NOT DETECTED Final   Influenza B NOT DETECTED NOT  DETECTED Final   Parainfluenza Virus 1 NOT DETECTED NOT DETECTED Final   Parainfluenza Virus 2 NOT DETECTED NOT DETECTED Final   Parainfluenza Virus 3 NOT DETECTED NOT DETECTED Final   Parainfluenza Virus 4 NOT DETECTED NOT DETECTED Final   Respiratory Syncytial Virus NOT DETECTED NOT DETECTED Final   Bordetella pertussis NOT DETECTED NOT DETECTED Final   Bordetella Parapertussis NOT DETECTED NOT DETECTED Final    Chlamydophila pneumoniae NOT DETECTED NOT DETECTED Final   Mycoplasma pneumoniae NOT DETECTED NOT DETECTED Final    Comment: Performed at [facility], [place] [address], [place], [place] [address]  Blood culture (routine x 2)     Status: None (Preliminary result)   Collection Time: 05/29/2021  5:38 PM   Specimen: BLOOD  Result Value Ref Range Status   Specimen Description   Final    BLOOD PORTA CATH Performed at [facility] [address], [place], [place] [address]    Special Requests   Final    BOTTLES DRAWN AEROBIC AND ANAEROBIC Blood Culture adequate volume Performed at [facility] [address] [place], [place] [address]    Culture   Final    NO GROWTH 4 DAYS Performed at [facility], [place] [address] [place], [place] [address]    Report Status PENDING  Incomplete  Blood culture (routine x 2)     Status: None (Preliminary result)   Collection Time: 06/05/2021  6:28 PM   Specimen: BLOOD  Result Value Ref Range Status   Specimen Description   Final    BLOOD SPECIMEN SOURCE NOT MARKED ON REQUISITION Performed at [place] [address] [place], [place] [address]    Special Requests   Final    BOTTLES DRAWN AEROBIC ONLY Blood Culture adequate volume Performed at [facility] [address], [place], [place] [address]    Culture   Final    NO GROWTH 4 DAYS Performed at [facility], [place] [address], [place], [place] [address]    Report Status PENDING  Incomplete  MRSA Next Gen by PCR, Nasal     Status: None   Collection Time: 06/16/2021 11:38 PM   Specimen: Nasal Mucosa; Nasal Swab  Result Value Ref Range Status   MRSA by PCR Next Gen NOT DETECTED NOT DETECTED Final    Comment: (NOTE) The GeneXpert MRSA Assay (FDA approved for NASAL specimens only), is one component of a comprehensive MRSA colonization surveillance program. It is not intended to diagnose MRSA infection nor  to guide or monitor treatment for MRSA infections. Test performance is not FDA approved in patients less than 68 years old. Performed at [facility], [place] [address] [place], [place] [address]   Urine Culture     Status: None (Preliminary result)   Collection Time: 05/28/2021  7:06 PM   Specimen: Urine, Clean Catch  Result Value Ref Range Status   Specimen Description   Final    URINE, CLEAN CATCH Performed at [facility], [place] [address], [place], [place] [address]    Special Requests   Final    NONE Performed at [facility], [place] [address] [place], [place] [address]    Culture   Final    CULTURE REINCUBATED FOR BETTER GROWTH Performed at [facility], [place] [address], [place],  [address]    Report Status PENDING  Incomplete      Radiology  Studies: No results found.    LOS: 3 days    Cordelia Poche, MD [facility] 05/22/2021, 12:18 PM   If 7PM-7AM, please contact night-coverage [email or web address]

## 2021-05-22 NOTE — Procedures (Signed)
Lumbar Epidural Steroid Injection - Interlaminar Approach with Fluoroscopic Guidance ? ?Patient: [name]      ?Date of Birth: 07/27/1945 ?MRN: [id number] ?PCP: Ann Held, DO      ?Visit Date: 05/22/2021 ?  ?Universal Protocol:    ? ?Consent Given By: the patient ? ?Position: PRONE ? ?Additional Comments: ?Vital signs were monitored before and after the procedure. ?Patient was prepped and draped in the usual sterile fashion. ?The correct patient, procedure, and site was verified. ? ? ?Injection Procedure Details:  ? ?Procedure diagnoses: Lumbar radiculopathy [M54.16]  ? ?Meds Administered:  ?Meds ordered this encounter  ?Medications  ? methylPREDNISolone acetate (DEPO-MEDROL) injection 80 mg  ?  ? ?Laterality: Right ? ?Location/Site:  L5-S1 ? ?Needle: 3.5 in., 20 ga. Tuohy ? ?Needle Placement: Paramedian epidural ? ?Findings:  ? -Comments: Excellent flow of contrast into the epidural space. ? ?Procedure Details: ?Using a paramedian approach from the side mentioned above, the region overlying the inferior lamina was localized under fluoroscopic visualization and the soft tissues overlying this structure were infiltrated with 4 ml. of 1% Lidocaine without Epinephrine. The Tuohy needle was inserted into the epidural space using a paramedian approach.  ? ?The epidural space was localized using loss of resistance along with counter oblique bi-planar fluoroscopic views.  After negative aspirate for air, blood, and CSF, a 2 ml. volume of Isovue-250 was injected into the epidural space and the flow of contrast was observed. Radiographs were obtained for documentation purposes.   ? ?The injectate was administered into the level noted above. ? ? ?Additional Comments:  ?The patient tolerated the procedure well ?Dressing: 2 x 2 sterile gauze and Band-Aid ?  ? ?Post-procedure details: ?Patient was observed during the procedure. ?Post-procedure instructions were reviewed. ? ?Patient left the clinic in stable  condition.  ?

## 2021-05-23 ENCOUNTER — Inpatient Hospital Stay: Payer: HMO

## 2021-05-23 ENCOUNTER — Inpatient Hospital Stay (HOSPITAL_COMMUNITY): Payer: HMO | Admitting: Certified Registered"

## 2021-05-23 ENCOUNTER — Encounter (HOSPITAL_COMMUNITY): Admission: EM | Disposition: E | Payer: Self-pay | Source: Home / Self Care | Attending: Family Medicine

## 2021-05-23 ENCOUNTER — Encounter (HOSPITAL_COMMUNITY): Payer: Self-pay | Admitting: Family Medicine

## 2021-05-23 DIAGNOSIS — I480 Paroxysmal atrial fibrillation: Secondary | ICD-10-CM | POA: Diagnosis not present

## 2021-05-23 DIAGNOSIS — I110 Hypertensive heart disease with heart failure: Secondary | ICD-10-CM

## 2021-05-23 DIAGNOSIS — Z980 Intestinal bypass and anastomosis status: Secondary | ICD-10-CM

## 2021-05-23 DIAGNOSIS — I5032 Chronic diastolic (congestive) heart failure: Secondary | ICD-10-CM | POA: Diagnosis not present

## 2021-05-23 DIAGNOSIS — D638 Anemia in other chronic diseases classified elsewhere: Secondary | ICD-10-CM | POA: Diagnosis not present

## 2021-05-23 DIAGNOSIS — I2510 Atherosclerotic heart disease of native coronary artery without angina pectoris: Secondary | ICD-10-CM

## 2021-05-23 DIAGNOSIS — D649 Anemia, unspecified: Secondary | ICD-10-CM | POA: Diagnosis not present

## 2021-05-23 HISTORY — PX: BIOPSY: SHX5522

## 2021-05-23 HISTORY — PX: COLONOSCOPY WITH PROPOFOL: SHX5780

## 2021-05-23 LAB — CULTURE, BLOOD (ROUTINE X 2)
Culture: NO GROWTH
Culture: NO GROWTH
Special Requests: ADEQUATE
Special Requests: ADEQUATE

## 2021-05-23 LAB — CBC
HCT: 25.1 % — ABNORMAL LOW (ref 36.0–46.0)
Hemoglobin: 8.2 g/dL — ABNORMAL LOW (ref 12.0–15.0)
MCH: 28.3 pg (ref 26.0–34.0)
MCHC: 32.7 g/dL (ref 30.0–36.0)
MCV: 86.6 fL (ref 80.0–100.0)
Platelets: 159 10*3/uL (ref 150–400)
RBC: 2.9 MIL/uL — ABNORMAL LOW (ref 3.87–5.11)
RDW: 19.9 % — ABNORMAL HIGH (ref 11.5–15.5)
WBC: 23.6 10*3/uL — ABNORMAL HIGH (ref 4.0–10.5)
nRBC: 0.3 % — ABNORMAL HIGH (ref 0.0–0.2)

## 2021-05-23 LAB — URINE CULTURE: Culture: 50000 — AB

## 2021-05-23 LAB — FERRITIN: Ferritin: 4167 ng/mL — ABNORMAL HIGH (ref 11–307)

## 2021-05-23 LAB — IRON AND TIBC
Iron: 37 ug/dL (ref 28–170)
Saturation Ratios: 18 % (ref 10.4–31.8)
TIBC: 210 ug/dL — ABNORMAL LOW (ref 250–450)
UIBC: 173 ug/dL

## 2021-05-23 LAB — LEGIONELLA PNEUMOPHILA SEROGP 1 UR AG: L. pneumophila Serogp 1 Ur Ag: NEGATIVE

## 2021-05-23 SURGERY — COLONOSCOPY WITH PROPOFOL
Anesthesia: Monitor Anesthesia Care

## 2021-05-23 MED ORDER — VANCOMYCIN HCL 500 MG/100ML IV SOLN
500.0000 mg | INTRAVENOUS | Status: AC
  Administered 2021-05-24 – 2021-05-25 (×2): 500 mg via INTRAVENOUS
  Filled 2021-05-23 (×2): qty 100

## 2021-05-23 MED ORDER — PHENYLEPHRINE 40 MCG/ML (10ML) SYRINGE FOR IV PUSH (FOR BLOOD PRESSURE SUPPORT)
PREFILLED_SYRINGE | INTRAVENOUS | Status: DC | PRN
  Administered 2021-05-23: 160 ug via INTRAVENOUS
  Administered 2021-05-23: 200 ug via INTRAVENOUS
  Administered 2021-05-23: 160 ug via INTRAVENOUS
  Administered 2021-05-23: 200 ug via INTRAVENOUS

## 2021-05-23 MED ORDER — VANCOMYCIN HCL 750 MG/150ML IV SOLN
750.0000 mg | Freq: Once | INTRAVENOUS | Status: AC
  Administered 2021-05-23: 750 mg via INTRAVENOUS
  Filled 2021-05-23: qty 150

## 2021-05-23 MED ORDER — LIDOCAINE 2% (20 MG/ML) 5 ML SYRINGE
INTRAMUSCULAR | Status: DC | PRN
  Administered 2021-05-23: 40 mg via INTRAVENOUS

## 2021-05-23 MED ORDER — PROPOFOL 500 MG/50ML IV EMUL
INTRAVENOUS | Status: DC | PRN
  Administered 2021-05-23: 100 ug/kg/min via INTRAVENOUS

## 2021-05-23 SURGICAL SUPPLY — 22 items

## 2021-05-23 NOTE — Anesthesia Preprocedure Evaluation (Addendum)
Anesthesia Evaluation  ?Patient identified by MRN, date of birth, ID band ?Patient awake ? ? ? ?Reviewed: ?Allergy & Precautions, NPO status , Patient's Chart, lab work & pertinent test results ? ?Airway ?Mallampati: III ? ?TM Distance: >3 FB ?Neck ROM: Full ? ? ? Dental ?no notable dental hx. ?(+) Teeth Intact, Dental Advisory Given ?  ?Pulmonary ?COPD,  oxygen dependent, former smoker,  ?  ?Pulmonary exam normal ?breath sounds clear to auscultation ? ? ? ? ? ? Cardiovascular ?hypertension, Pt. on medications ?+ CAD, +CHF and + DVT  ?Normal cardiovascular exam+ dysrhythmias Atrial Fibrillation + pacemaker  ?Rhythm:Regular Rate:Normal ? ?TEE 2021 ??1. Left ventricular ejection fraction, by estimation, is 60 to 65%. The  ?left ventricle has normal function. The left ventricle has no regional  ?wall motion abnormalities. There is mild left ventricular hypertrophy.  ??2. Right ventricular systolic function is normal. The right ventricular  ?size is normal.  ??3. Left atrial size was mildly dilated. No left atrial/left atrial  ?appendage thrombus was detected.  ??4. The mitral valve is normal in structure. Mild mitral valve  ?regurgitation.  ??5. The aortic valve is tricuspid. Aortic valve regurgitation is not  ?visualized.  ??6. No vegetation seen  ?  ?Neuro/Psych ? Headaches, TIAnegative psych ROS  ? GI/Hepatic ?Neg liver ROS, GERD  Medicated,  ?Endo/Other  ?negative endocrine ROS ? Renal/GU ?negative Renal ROS  ?negative genitourinary ?  ?Musculoskeletal ? ?(+) Arthritis , Fibromyalgia - ? Abdominal ?  ?Peds ? Hematology ? ?(+) Blood dyscrasia (on eliquis), anemia , Lab Results ?     Component                Value               Date                 ?     WBC                      23.6 (H)            06/13/2021           ?     HGB                      8.2 (L)             06/15/2021           ?     HCT                      25.1 (L)            06/04/2021           ?     MCV                       86.6                06/13/2021           ?     PLT                      159                 05/30/2021           ?   ?Anesthesia Other Findings ? ? Reproductive/Obstetrics ? ?  ? ? ? ? ? ? ? ? ? ? ? ? ? ?  ?  ? ? ? ? ? ? ? ?  Anesthesia Physical ?Anesthesia Plan ? ?ASA: 3 ? ?Anesthesia Plan: MAC  ? ?Post-op Pain Management:   ? ?Induction: Intravenous ? ?PONV Risk Score and Plan: Propofol infusion and Treatment may vary due to age or medical condition ? ?Airway Management Planned: Natural Airway ? ?Additional Equipment:  ? ?Intra-op Plan:  ? ?Post-operative Plan:  ? ?Informed Consent: I have reviewed the patients History and Physical, chart, labs and discussed the procedure including the risks, benefits and alternatives for the proposed anesthesia with the patient or authorized representative who has indicated his/her understanding and acceptance.  ? ? ? ?Dental advisory given ? ?Plan Discussed with: CRNA ? ?Anesthesia Plan Comments:   ? ? ? ? ? ? ?Anesthesia Quick Evaluation ? ?

## 2021-05-23 NOTE — Progress Notes (Addendum)
Pharmacy Antibiotic Note ? ?[name] is a 76 y.o. female admitted on 05/30/2021 with melena, worsening back pain/weakness.  Patient is currently receiving Levofloxacin for recurrent pneumonia.  Urine culture from 2/27 with Enterococcus faecium and seen again on repeat urine culture 3/4. Patient does not endorse urinary symptoms.  Pharmacy has been consulted for vancomycin dosing. ? ?Discussed with provider - he would like to treat for 3 days.  ? ?Plan: ?Vancomycin 750mg  IV x1, followed by ?Vancomycin 500mg  IV q24 hours x 2 doses ?Monitor renal function and clinical improvement ? ?Height: 5' (152.4 cm) ?Weight: 40 kg (88 lb 2.9 oz) ?IBW/kg (Calculated) : 45.5 ? ?Temp (24hrs), Avg:98 ?F (36.7 ?C), Min:97.6 ?F (36.4 ?C), Max:98.2 ?F (36.8 ?C) ? ?Recent Labs  ?Lab 06/09/2021 ?1605 05/30/2021 ?1727 05/19/21 ?9311 05/28/2021 ?0436 05/21/21 ?1054 05/22/21 ?2162 05/31/2021 ?4469  ?WBC 10.8*  --  10.0 16.3* 20.3* 17.4* 23.6*  ?CREATININE 1.03*  --  0.62  --  0.50 0.47  --   ?LATICACIDVEN  --  0.8  --   --   --   --   --   ?  ?Estimated Creatinine Clearance: 37.8 mL/min (by C-G formula based on SCr of 0.47 mg/dL).   ? ?Allergies  ?Allergen Reactions  ? Dabigatran Etexilate Mesylate Other (See Comments)  ?  INTERNAL BLEEDING- Pradaxa  ? Augmentin [Amoxicillin-Pot Clavulanate] Diarrhea and Nausea Only  ?  Did it involve swelling of the face/tongue/throat, SOB, or low BP? No ?Did it involve sudden or severe rash/hives, skin peeling, or any reaction on the inside of your mouth or nose? No ?Did you need to seek medical attention at a hospital or doctor's office? No ?When did it last happen? 02/2019 ?If all above answers are "NO", may proceed with cephalosporin use. ?  ? Talwin [Pentazocine] Other (See Comments)  ?  Hallucinations ?  ? ? ?Antimicrobials this admission: ?Levofloxacin 3/3 >> 3/12 ?Vanc 3/7 >> 3/9 ? ?Dose adjustments this admission: ? ? ?Microbiology results: ?3/2 BCx: ngtd ?3/4 UCx: E. Faecium (R-Macrobid)  ? ?Thank you  for allowing pharmacy to be a part of this patient?s care. ? ?Dimple Nanas, PharmD ?06/06/2021 10:47 AM ? ?

## 2021-05-23 NOTE — Assessment & Plan Note (Addendum)
-   Nutrition supplements

## 2021-05-23 NOTE — Progress Notes (Signed)
PROGRESS NOTE    [name]  UXN:[id number] DOB: [birth year] DOA: 05/19/2021 PCP: Ann Held, DO   Brief Narrative: [name] is a 76 y.o. female with a history of atrial fibrillation/flutter on Eliquis, CAD, COPD, recent wrist fracture, s/p PPM, MAI infection, chronic pain, bronchiectasis, chronic CHF. Patient presented secondary to worsening back pain and weakness but found to also have a history of melena and concern for upper GI bleeding. FOBT was positive in the ED. GI consulted and patient given 2 units of PRBC for a hemoglobin of 6 with symptoms. Incidentally, concern for multifocal pneumonia on chest x-ray. CT chest concerning for RUL consolidation concerning for mass. Pulmonology consulted with recommendation for outpatient PET CT and have transitioned the patient to Levaquin. Upper endoscopy significant for gastritis. Plan for colonoscopy.   Assessment and Plan: * Symptomatic anemia Secondary to GI bleeding as evidenced by melena and guaiac positive stool test. [facility] consulted on admission. Patient started empirically on Protonix IV drip. Hemoglobin of 6 on admission, down from baseline of about 10. 2 units of PRBC ordered by EDP with rebound hemoglobin of 8.9 >10.2 and is now trending down. GI performed upper endoscopy on 3/4 which was significant for gastritis but no other source for anemia. Patient with no melena over the last 24 hours and has no documentation of hematochezia/melena with bowel prep. -Daily CBC -Follow-up GI recommendations: Continue protonix, plan for colonoscopy on 3/7  Multifocal pneumonia Recurrent issues with pneumonia. Patient with a history of MAI not currently on treatment (completed treatment in 2022). Last seen by ID in July 2022. Negative influenza/COVID. Started empirically on Ceftriaxone and azithromycin. Recently complete a 7 day treatment course of Levaquin for pneumonia. History of MAI and bronchiectasis makes this more  complicated. Symptoms, per patient, are at baseline. RVP negative. MRSA PCR negative. Strep pneumo urine antigen negative. Pulmonology transitioned to [facility] with recommendation for 10 day course -Levaquin 500 mg daily x10 days, per pulmonology recommendations -Sputum culture, urine legionella  UTI (urinary tract infection) Diagnosed prior to admission. Urine culture from 2/27 significant for enterococcus faecium, susceptible to ampicillin. Patient received Levaquin for treatment. Unsure if this regimen would have treated infection. Since patient has no symptoms, repeat urine culture ordered and significant for persistent bacteruria suggesting incomplete treatment. -Vancomycin IV x3 days  Right upper lobe consolidation ([place]) Seen on CT scan, concerning for malignancy, but in setting of MAI. Also noted was a small loculated effusion. Pulmonology consulted with recommendation for outpatient PET CT in 2 weeks and follow-up with pulmonology. Recommendation for no aspiration of right-sided loculated effusion at this time.  Leukocytosis Worsened during admission. Afebrile. In setting of pneumonia. Peak of 20,300 and is now downtrending. -CBC pending  Pulmonary MAI (mycobacterium avium-intracellulare) infection ([facility]) See problem, Multifocal pneumonia  Bronchiectasis ([place]) Chronic issue. Patient follows with pulmonology.  Protein-calorie malnutrition, moderate (HCC) Copper elevated. Dietitian recommendations (05/19/2021): - will order Boost Breeze BID, each supplement provides 250 kcal and 9 grams of protein - will order 1 packet Juven BID, each packet provides 95 calories, 2.5 grams of protein (collagen), and 9.8 grams of carbohydrate (3 grams sugar); also contains 7 grams of L-arginine and L-glutamine, 300 mg vitamin C, 15 mg vitamin E, 1.2 mcg vitamin B-12, 9.5 mg zinc, 200 mg calcium, and 1.5 g  Calcium Beta-hydroxy-Beta-methylbutyrate to support wound healing. - will order 1 tablet Multivitamin  with minerals daily - diet advancement as medically feasible.  - will check serum copper d/t  wound. - recommend 250 mg ascorbic acid BID and 220 mg zinc sulfate/day x14 days to aid in wound healing.  Anemia, chronic disease Patient follows with hematology/oncology, Dr. Marin Olp. Right port placed in upper chest. Patient receives intermittent blood transfusions. Baseline hemoglobin of about 10.  Chronic hypoxemic respiratory failure (HCC) -Continue home oxygen at 4 L/min  Atrial fibrillation/flutter Currently seems to be in sinus rhythm with increased rate. Patient is on Eliquis for stroke prevention. She is not on rate or rhythm control medication. -Telemetry while having acute GI bleeding  Pressure injury of skin Likely related to recent prolonged immobility. Located on medial sacrum. Wound nurse recommendation (3/3): I will provide the patient with a mattress replacement with low air loss feature today and provide Nursing with guidance to turn from side to side and minimize the supine position. Heels are to be floated. Topical care is with collagenase (Santyl) ointment applied once daily and PRN soiling. This is to be topped with a saline dampened gauze dressing, dry gauze and secured with the silicone foam. The "tip" of the silicone foam is to be oriented away from the anus. Nutrition will need to be maximized to enhance wound healing.  Recurrent falls Unsure of etiology. Possibly related to anemia. Patient is also on gabapentin with low renal function. Back pain is also a barrier to successful mobility. PT recommending SNF. -Consulted TOC for SNF discharge  Hyperlipidemia LDL goal <100 -Continue Lipitor  Insomnia -Continue Ambien qhs PRN  Fibromyalgia See problem, Chronic pain syndrome  COPD with emphysema (HCC) -Continue Incruse Ellipta and albuterol  Essential hypertension Currently mostly normotensive. Patient is on amlodipine as an outpatient. -Hold amlodipine for now in  setting of acute GI bleeding and being normotensive currently  Chronic pain syndrome -Continue home Percocet and gabapentin (decrease dose for renal function as needed)  Chronic diastolic CHF (congestive heart failure) (HCC) Stable. Patient is euvolemic. She is on Lasix as an outpatient. -Daily weights/strict in and out -Continue home Lasix    DVT prophylaxis: SCDs Code Status:   Code Status: Full Code Family Communication: None at bedside Disposition Plan: Discharge home likely in 3-5 days pending GI recommendations/management, stable hemoglobin, improvement of GI bleeding, completion of IV antibiotics for UTI   Consultants:  [facility]  Procedures:  Upper endoscopy (3/4)  Antimicrobials: Ceftriaxone Azithromycin Levaquin Vancomycin    Subjective: No issues overnight. Did not complete her bowel prep.  Objective: BP 115/62 (BP Location: Right Arm)    Pulse (!) 110    Temp 97.6 F (36.4 C) (Oral)    Resp 18    Ht 5' (1.524 m)    Wt 40 kg    SpO2 100%    BMI 17.22 kg/m   Examination:  General exam: Appears calm and comfortable Respiratory system: Clear to auscultation. Respiratory effort normal. Cardiovascular system: S1 & S2 heard, normal rhythm, fast rate. Gastrointestinal system: Abdomen is nondistended, soft and nontender. Normal bowel sounds heard. Central nervous system: Alert and oriented. No focal neurological deficits. Musculoskeletal: No edema. No calf tenderness Skin: No cyanosis. No rashes Psychiatry: Judgement and insight appear normal. Mood & affect appropriate.    Data Reviewed: I have personally reviewed following labs and imaging studies  CBC Lab Results  Component Value Date   WBC 17.4 (H) 05/22/2021   RBC 3.06 (L) 05/22/2021   HGB 8.6 (L) 05/22/2021   HCT 26.9 (L) 05/22/2021   MCV 87.9 05/22/2021   MCH 28.1 05/22/2021   PLT 133 (L) 05/22/2021  MCHC 32.0 05/22/2021   RDW 19.1 (H) 05/22/2021   LYMPHSABS 0.6 (L) 05/22/2021    MONOABS 1.0 05/22/2021   EOSABS 0.0 05/22/2021   BASOSABS 0.1 79/04/4095     Last metabolic panel Lab Results  Component Value Date   NA 132 (L) 05/22/2021   K 3.8 05/22/2021   CL 94 (L) 05/22/2021   CO2 31 05/22/2021   BUN 29 (H) 05/22/2021   CREATININE 0.47 05/22/2021   GLUCOSE 106 (H) 05/22/2021   GFRNONAA >60 05/22/2021   GFRAA >60 12/01/2019   CALCIUM 8.8 (L) 05/22/2021   PHOS 3.0 05/17/2014   PROT 6.7 05/22/2021   ALBUMIN 2.9 (L) 05/25/2021   BILITOT 1.2 05/29/2021   ALKPHOS 115 06/13/2021   AST 52 (H) 06/12/2021   ALT 12 06/12/2021   ANIONGAP 7 05/22/2021    GFR: Estimated Creatinine Clearance: 37.8 mL/min (by C-G formula based on SCr of 0.47 mg/dL).  Recent Results (from the past 240 hour(s))  Resp Panel by RT-PCR (Flu A&B, Covid) Nasopharyngeal Swab     Status: None   Collection Time: 06/10/2021  4:05 PM   Specimen: Nasopharyngeal Swab; Nasopharyngeal(NP) swabs in vial transport medium  Result Value Ref Range Status   SARS Coronavirus 2 by RT PCR NEGATIVE NEGATIVE Final    Comment: (NOTE) SARS-CoV-2 target nucleic acids are NOT DETECTED.  The SARS-CoV-2 RNA is generally detectable in upper respiratory specimens during the acute phase of infection. The lowest concentration of SARS-CoV-2 viral copies this assay can detect is 138 copies/mL. A negative result does not preclude SARS-Cov-2 infection and should not be used as the sole basis for treatment or other patient management decisions. A negative result may occur with  improper specimen collection/handling, submission of specimen other than nasopharyngeal swab, presence of viral mutation(s) within the areas targeted by this assay, and inadequate number of viral copies(<138 copies/mL). A negative result must be combined with clinical observations, patient history, and epidemiological information. The expected result is Negative.  Fact Sheet for Patients:   EntrepreneurPulse.com.au  Fact Sheet for Healthcare Providers:  IncredibleEmployment.be  This test is no t yet approved or cleared by the [place] FDA and  has been authorized for detection and/or diagnosis of SARS-CoV-2 by FDA under an Emergency Use Authorization (EUA). This EUA will remain  in effect (meaning this test can be used) for the duration of the COVID-19 declaration under Section 564(b)(1) of the Act, 21 U.S.C.section 360bbb-3(b)(1), unless the authorization is terminated  or revoked sooner.       Influenza A by PCR NEGATIVE NEGATIVE Final   Influenza B by PCR NEGATIVE NEGATIVE Final    Comment: (NOTE) The Xpert Xpress SARS-CoV-2/FLU/RSV plus assay is intended as an aid in the diagnosis of influenza from Nasopharyngeal swab specimens and should not be used as a sole basis for treatment. Nasal washings and aspirates are unacceptable for Xpert Xpress SARS-CoV-2/FLU/RSV testing.  Fact Sheet for Patients: EntrepreneurPulse.com.au  Fact Sheet for Healthcare Providers: IncredibleEmployment.be  This test is not yet approved or cleared by the [place] FDA and has been authorized for detection and/or diagnosis of SARS-CoV-2 by FDA under an Emergency Use Authorization (EUA). This EUA will remain in effect (meaning this test can be used) for the duration of the COVID-19 declaration under Section 564(b)(1) of the Act, 21 U.S.C. section 360bbb-3(b)(1), unless the authorization is terminated or revoked.  Performed at [facility], [place] [address], [place], [place] [address]   Respiratory (~20 pathogens) panel by PCR  Status: None   Collection Time: 05/19/2021  4:05 PM   Specimen: Nasopharyngeal Swab; Respiratory  Result Value Ref Range Status   Adenovirus NOT DETECTED NOT DETECTED Final   Coronavirus 229E NOT DETECTED NOT DETECTED Final    Comment: (NOTE) The Coronavirus  on the Respiratory Panel, DOES NOT test for the novel  Coronavirus (2019 nCoV)    Coronavirus HKU1 NOT DETECTED NOT DETECTED Final   Coronavirus NL63 NOT DETECTED NOT DETECTED Final   Coronavirus OC43 NOT DETECTED NOT DETECTED Final   Metapneumovirus NOT DETECTED NOT DETECTED Final   Rhinovirus / Enterovirus NOT DETECTED NOT DETECTED Final   Influenza A NOT DETECTED NOT DETECTED Final   Influenza B NOT DETECTED NOT DETECTED Final   Parainfluenza Virus 1 NOT DETECTED NOT DETECTED Final   Parainfluenza Virus 2 NOT DETECTED NOT DETECTED Final   Parainfluenza Virus 3 NOT DETECTED NOT DETECTED Final   Parainfluenza Virus 4 NOT DETECTED NOT DETECTED Final   Respiratory Syncytial Virus NOT DETECTED NOT DETECTED Final   Bordetella pertussis NOT DETECTED NOT DETECTED Final   Bordetella Parapertussis NOT DETECTED NOT DETECTED Final   Chlamydophila pneumoniae NOT DETECTED NOT DETECTED Final   Mycoplasma pneumoniae NOT DETECTED NOT DETECTED Final    Comment: Performed at [facility], [address], [place], [place] [address]  Blood culture (routine x 2)     Status: None (Preliminary result)   Collection Time: 05/17/2021  5:38 PM   Specimen: BLOOD  Result Value Ref Range Status   Specimen Description   Final    BLOOD PORTA CATH Performed at [facility] [address], [place], [place] [address]    Special Requests   Final    BOTTLES DRAWN AEROBIC AND ANAEROBIC Blood Culture adequate volume Performed at [facility] [address] [place], [place] [address]    Culture   Final    NO GROWTH 4 DAYS Performed at [facility], [place] [address] [place], [place] [address]    Report Status PENDING  Incomplete  Blood culture (routine x 2)     Status: None (Preliminary result)   Collection Time: 05/22/2021  6:28 PM   Specimen: BLOOD  Result Value Ref Range Status   Specimen Description   Final    BLOOD SPECIMEN SOURCE NOT MARKED ON  REQUISITION Performed at [facility] [address], [place], [place] [address]    Special Requests   Final    BOTTLES DRAWN AEROBIC ONLY Blood Culture adequate volume Performed at [facility] [address], [place], [place] [address]    Culture   Final    NO GROWTH 4 DAYS Performed at [facility], [place] [address], [place], [place] [address]    Report Status PENDING  Incomplete  MRSA Next Gen by PCR, Nasal     Status: None   Collection Time: 05/21/2021 11:38 PM   Specimen: Nasal Mucosa; Nasal Swab  Result Value Ref Range Status   MRSA by PCR Next Gen NOT DETECTED NOT DETECTED Final    Comment: (NOTE) The GeneXpert MRSA Assay (FDA approved for NASAL specimens only), is one component of a comprehensive MRSA colonization surveillance program. It is not intended to diagnose MRSA infection nor to guide or monitor treatment for MRSA infections. Test performance is not FDA approved in patients less than 20 years old. Performed at [facility], [place] [address] [place],  [address]   Urine  Culture     Status: Abnormal   Collection Time: 05/17/2021  7:06 PM   Specimen: Urine, Clean Catch  Result Value Ref Range Status   Specimen Description   Final    URINE, CLEAN CATCH Performed at [facility], [place] [address] [place], [place] [address]    Special Requests   Final    NONE Performed at [facility], [place] [address], [place], [place] [address]    Culture 50,000 COLONIES/mL ENTEROCOCCUS FAECIUM (A)  Final   Report Status 06/06/2021 FINAL  Final   Organism ID, Bacteria ENTEROCOCCUS FAECIUM (A)  Final      Susceptibility   Enterococcus faecium - MIC*    AMPICILLIN <=2 SENSITIVE Sensitive     NITROFURANTOIN 64 INTERMEDIATE Intermediate     VANCOMYCIN <=0.5 SENSITIVE Sensitive     * 50,000 COLONIES/mL ENTEROCOCCUS FAECIUM      Radiology Studies: No results found.    LOS: 4  days    Cordelia Poche, MD [facility] 06/08/2021, 8:35 AM   If 7PM-7AM, please contact night-coverage [email or web address]

## 2021-05-23 NOTE — Interval H&P Note (Signed)
History and Physical Interval Note: ? ?05/25/2021 ?1:18 PM ? ?[name]  has presented today for surgery, with the diagnosis of SYMPTOMATIC ANEMIA, BLOOD IN STOOL.  The various methods of treatment have been discussed with the patient and family. After consideration of risks, benefits and other options for treatment, the patient has consented to  Procedure(s): ?COLONOSCOPY WITH PROPOFOL (N/A) as a surgical intervention.  The patient's history has been reviewed, patient examined, no change in status, stable for surgery.  I have reviewed the patient's chart and labs.  Questions were answered to the patient's satisfaction.   ? ? ?Thornton Park ? ? ?

## 2021-05-23 NOTE — TOC Progression Note (Signed)
Transition of Care (TOC) - Progression Note  ? ? ?Patient Details  ?Name: [name] ?MRN: [id number] ?Date of Birth: [birth year] ? ?Transition of Care (TOC) CM/SW Contact  ?Cathyrn Deas, Marjie Skiff, RN ?Phone Number: ?06/15/2021, 11:18 AM ? ?Clinical Narrative:    ?Received phone call from [facility] to inform me that according to the notes the pt is not ready for DC and would need to be closer to dc prior to doing auth for SNF. Will call back to restart auth once she is closer to being medically ready for dc. ? ? ?Expected Discharge Plan: [place] ?Barriers to Discharge: Continued Medical Work up ? ?Expected Discharge Plan and Services ?Expected Discharge Plan: [place] ?  ?Discharge Planning Services: CM Consult ?  ?Living arrangements for the past 2 months: [place] ?                ?  ?Social Determinants of Health (SDOH) Interventions ?  ? ?Readmission Risk Interventions ?No flowsheet data found. ? ?

## 2021-05-23 NOTE — Op Note (Signed)
[facility] ?Patient Name: [name] ?Procedure Date: 06/10/2021 ?MRN: [id number] ?Attending MD: Thornton Park MD, MD ?Date of Birth: 01/11/1946 ?CSN: [id number] ?Age: 76 ?Admit Type: Inpatient ?Procedure:                Colonoscopy ?Indications:              Acute on chronic anemia ?Providers:                Thornton Park MD, MD, Ladoris Gene, RN,  ?                          William Dalton, Technician ?Referring MD:              ?Medicines:                Monitored Anesthesia Care ?Complications:            No immediate complications. ?Estimated Blood Loss:     Estimated blood loss was minimal. ?Procedure:                Pre-Anesthesia Assessment: ?                          - Prior to the procedure, a History and Physical  ?                          was performed, and patient medications and  ?                          allergies were reviewed. The patient's tolerance of  ?                          previous anesthesia was also reviewed. The risks  ?                          and benefits of the procedure and the sedation  ?                          options and risks were discussed with the patient.  ?                          All questions were answered, and informed consent  ?                          was obtained. Prior Anticoagulants: The patient has  ?                          taken no previous anticoagulant or antiplatelet  ?                          agents. ASA Grade Assessment: III - A patient with  ?                          severe systemic disease. After reviewing the risks  ?  and benefits, the patient was deemed in  ?                          satisfactory condition to undergo the procedure. ?                          After obtaining informed consent, the colonoscope  ?                          was passed under direct vision. Throughout the  ?                          procedure, the patient's blood pressure, pulse, and  ?                          oxygen  saturations were monitored continuously. The  ?                          [id number] ([id number]) Olympus colonoscope was  ?                          introduced through the anus and advanced to the the  ?                          ileocolonic anastomosis. Approximately 10cm of  ?                          ileum was evaluated. The colonoscopy was performed  ?                          without difficulty. The patient tolerated the  ?                          procedure well. The quality of the bowel  ?                          preparation was good. The terminal ileum, ileocecal  ?                          valve, appendiceal orifice, and rectum were  ?                          photographed. ?Scope In: 1:38:13 PM ?Scope Out: 1:48:30 PM ?Scope Withdrawal Time: 0 hours 4 minutes 9 seconds  ?Total Procedure Duration: 0 hours 10 minutes 17 seconds  ?Findings: ?     There was evidence of a prior end-to-end ileo-colonic anastomosis at 20  ?     cm proximal to the anus. This was patent. However, one area of the  ?     anastomosis had a subtle nodularity with associated edema, erythema, and  ?     possible central depression. The anastomosis was easily traversed.  ?     Biopsies were taken from the irregularity at the anastomosis with a cold  ?     forceps for histology. Estimated blood loss was minimal. ?  The entire examined ileum appeared normal. No evidence for recent  ?     bleeding. ?Impression:               - Subtotal colectomy with anastomosis located 20 cm  ?                          from the anal verge. ?                          - Sublte, mucosal irregularity at the anastomosis.  ?                          Unclear clinical significance. Biopsied. ?                          - The examined portion of the ileum was normal. ?                          - No evidence for active or recent bleeding. ?Moderate Sedation: ?     Not Applicable - Patient had care per Anesthesia. ?Recommendation:           - Return patient to hospital  ward for ongoing care. ?                          - Resume regular diet. ?                          - Continue present medications. ?                          - Await pathology results. ?                          No additional inpatient GI evaluation planned at  ?                          this time. Results and recommendations were  ?                          discussed with her husband by phone. ?Procedure Code(s):        --- Professional --- ?                          [id number], Colonoscopy, flexible; with biopsy, single  ?                          or multiple ?Diagnosis Code(s):        --- Professional --- ?                          Z98.0, Intestinal bypass and anastomosis status ?                          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 All rights reserved. ?The codes documented in this report are preliminary and upon coder review may  ?be revised to meet current compliance requirements. ?Thornton Park MD, MD ?05/17/2021 2:09:09 PM ?This report has been signed electronically. ?Number of Addenda: 0 ?

## 2021-05-23 NOTE — Transfer of Care (Signed)
Immediate Anesthesia Transfer of Care Note ? ?Patient: [name] ? ?Procedure(s) Performed: COLONOSCOPY WITH PROPOFOL ?BIOPSY ? ?Patient Location: PACU and Endoscopy Unit ? ?Anesthesia Type:MAC ? ?Level of Consciousness: awake, alert  and oriented ? ?Airway & Oxygen Therapy: Patient Spontanous Breathing and Patient connected to face mask ? ?Post-op Assessment: Report given to RN and Post -op Vital signs reviewed and stable ? ?Post vital signs: Reviewed and stable ? ?Last Vitals:  ?Vitals Value Taken Time  ?BP    ?Temp    ?Pulse    ?Resp 21 05/19/2021 1402  ?SpO2    ?Vitals shown include unvalidated device data. ? ?Last Pain:  ?Vitals:  ? 05/17/2021 1159  ?TempSrc: Temporal  ?PainSc:   ?   ? ?Patients Stated Pain Goal: 2 (05/22/21 2126) ? ?Complications: No notable events documented. ?

## 2021-05-23 NOTE — Progress Notes (Signed)
OT Cancellation Note ? ?Patient Details ?Name: MCKAYLEE DIMALANTA ?MRN: [id number] ?DOB: Feb 11, 1946 ? ? ?Cancelled Treatment:    Reason Eval/Treat Not Completed: Patient at procedure or test/ unavailable. Patient going for colonoscopy within next few minutes. Will re-attempt as schedule permits.  ? ?Delbert Phenix OT ?OT pager: [phone] ? ? ?Rosemary Holms ?05/22/2021, 11:46 AM ?

## 2021-05-23 NOTE — Anesthesia Postprocedure Evaluation (Signed)
Anesthesia Post Note ? ?Patient: [name] ? ?Procedure(s) Performed: COLONOSCOPY WITH PROPOFOL ?BIOPSY ? ?  ? ?Patient location during evaluation: Endoscopy ?Anesthesia Type: MAC ?Level of consciousness: awake and alert ?Pain management: pain level controlled ?Vital Signs Assessment: post-procedure vital signs reviewed and stable ?Respiratory status: spontaneous breathing, nonlabored ventilation, respiratory function stable and patient connected to nasal cannula oxygen ?Cardiovascular status: blood pressure returned to baseline and stable ?Postop Assessment: no apparent nausea or vomiting ?Anesthetic complications: no ? ? ?No notable events documented. ? ?Last Vitals:  ?Vitals:  ? 05/27/2021 1415 05/31/2021 1420  ?BP:  (!) 112/57  ?Pulse: (!) 110 (!) 109  ?Resp: (!) 26 14  ?Temp:    ?SpO2: 100% 99%  ?  ?Last Pain:  ?Vitals:  ? 05/30/2021 1420  ?TempSrc:   ?PainSc: 0-No pain  ? ? ?  ?  ?  ?  ?  ?  ? ?[name] ? ? ? ? ?

## 2021-05-24 ENCOUNTER — Encounter (HOSPITAL_COMMUNITY): Payer: Self-pay | Admitting: Gastroenterology

## 2021-05-24 DIAGNOSIS — C189 Malignant neoplasm of colon, unspecified: Secondary | ICD-10-CM

## 2021-05-24 DIAGNOSIS — D649 Anemia, unspecified: Secondary | ICD-10-CM | POA: Diagnosis not present

## 2021-05-24 DIAGNOSIS — C187 Malignant neoplasm of sigmoid colon: Secondary | ICD-10-CM

## 2021-05-24 LAB — PREPARE RBC (CROSSMATCH)

## 2021-05-24 LAB — CBC
HCT: 22.5 % — ABNORMAL LOW (ref 36.0–46.0)
Hemoglobin: 7 g/dL — ABNORMAL LOW (ref 12.0–15.0)
MCH: 28.2 pg (ref 26.0–34.0)
MCHC: 31.1 g/dL (ref 30.0–36.0)
MCV: 90.7 fL (ref 80.0–100.0)
Platelets: 137 10*3/uL — ABNORMAL LOW (ref 150–400)
RBC: 2.48 MIL/uL — ABNORMAL LOW (ref 3.87–5.11)
RDW: 20.2 % — ABNORMAL HIGH (ref 11.5–15.5)
WBC: 16.6 10*3/uL — ABNORMAL HIGH (ref 4.0–10.5)
nRBC: 0.4 % — ABNORMAL HIGH (ref 0.0–0.2)

## 2021-05-24 LAB — SURGICAL PATHOLOGY

## 2021-05-24 MED ORDER — NYSTATIN 100000 UNIT/GM EX POWD
Freq: Three times a day (TID) | CUTANEOUS | Status: DC
  Administered 2021-05-27 – 2021-05-28 (×4): 1 via TOPICAL
  Filled 2021-05-24: qty 15

## 2021-05-24 MED ORDER — SODIUM CHLORIDE 0.9% IV SOLUTION: Freq: Once | INTRAVENOUS | Status: AC

## 2021-05-24 MED ORDER — PROCHLORPERAZINE EDISYLATE 10 MG/2ML IJ SOLN
10.0000 mg | Freq: Four times a day (QID) | INTRAMUSCULAR | Status: DC | PRN
  Administered 2021-05-24 – 2021-05-25 (×3): 10 mg via INTRAVENOUS
  Filled 2021-05-24 (×3): qty 2

## 2021-05-24 NOTE — Plan of Care (Signed)

## 2021-05-24 NOTE — Assessment & Plan Note (Addendum)
Incidental finding, suspect lung primary and that this is second, unrelated cancer.

## 2021-05-24 NOTE — Progress Notes (Signed)
? ? ? ?Progress Note ? ?Hospital Day: 7 ? ?Chief Complaint: anemia and black stool ? ?Assessment and Plan  ? ?Brief History ?76 yo female with HTN, Afib / Aflutter on Eliquis , pulmonary MAI, chronic hypoxemic respiratory failure, CAD, GERD, prior SBO's, fibromyalgia, chronic pain. Admitted 3/2 with multifocal pneumonia, black stools and symptomatic acute on chronic anemia.  ?   ?# Acute on chronic symptomatic anemia hgb 9.8 >> 6.   EGD >>> non-bleeding mild gastritis ( no biopsies done). Colonoscopy with findings of only subtle, mucosal irregularity at the anastomosis. Followed by Hematology for IDA and Erythropoietin anemia. Get Retacrit as needed. ?--No BMs or evidence for GI bleeding today. No further GI workup planned ?--awaiting anastomosis biopsies results.   ?--Hgb improved from 6 to 8.2 post 2 u PRBC. Husband concerned because post transfusion her hgb rose to 10.2 and now at 8.2. Explained about equilibration and that the 8.2 is an appropriate response ?--Will defer to Dr. Tarri Glenn about when to resume Eliquis ?--Follow up with Dr. Carlean Purl on 4/11 at 3:30pm  ? ? ?Subjective  ? ?Breathing is at baseline but still feels weak ? ?Objective  ? ? ?Endoscopic Studies this admission ? 05/21/21 EGD ?--mild gastritis ? ?05/17/2021 colonoscopy ?--complete exam, good prep. Subtotal colectomy with anastomosis located 20 cm from the anal verge. ?- Sublte, mucosal irregularity at the anastomosis. Unclear clinical significance. Biopsied. ?- The examined portion of the ileum was normal. ?- No evidence for active or recent bleeding ? ? ?Lab Results: ?Recent Labs  ?  05/21/21 ?1054 05/22/21 ?8242 05/19/2021 ?3536  ?WBC 20.3* 17.4* 23.6*  ?HGB 9.6* 8.6* 8.2*  ?HCT 30.2* 26.9* 25.1*  ?PLT 146* 133* 159  ? ?BMET ?Recent Labs  ?  05/21/21 ?1054 05/22/21 ?0515  ?NA 132* 132*  ?K 3.7 3.8  ?CL 93* 94*  ?CO2 30 31  ?GLUCOSE 155* 106*  ?BUN 28* 29*  ?CREATININE 0.50 0.47  ?CALCIUM 9.2 8.8*  ? ?LFT ?No results for input(s): PROT, ALBUMIN, AST,  ALT, ALKPHOS, BILITOT, BILIDIR, IBILI in the last 72 hours. ?PT/INR ?No results for input(s): LABPROT, INR in the last 72 hours. ? ?  ? ?Scheduled inpatient medications:  ? vitamin C  250 mg Oral BID  ? atorvastatin  20 mg Oral QHS  ? Chlorhexidine Gluconate Cloth  6 each Topical Daily  ? collagenase   Topical Daily  ? feeding supplement  1 Container Oral BID BM  ? furosemide  40 mg Oral Daily  ? gabapentin  300 mg Oral BID  ? And  ? gabapentin  600 mg Oral QHS  ? levofloxacin  500 mg Oral Daily  ? multivitamin with minerals  1 tablet Oral Daily  ? nutrition supplement (JUVEN)  1 packet Oral BID BM  ? pantoprazole  40 mg Oral Daily  ? sodium chloride flush  10-40 mL Intracatheter Q12H  ? umeclidinium bromide  1 puff Inhalation Daily  ? zinc sulfate  220 mg Oral Daily  ? ?Continuous inpatient infusions:  ? vancomycin    ? ?PRN inpatient medications: oxyCODONE **AND** acetaminophen, albuterol, hydrALAZINE, morphine injection, ondansetron **OR** ondansetron (ZOFRAN) IV, sodium chloride flush, zolpidem ? ?Vital signs in last 24 hours: ?Temp:  [97.4 ?F (36.3 ?C)-98.9 ?F (37.2 ?C)] 97.4 ?F (36.3 ?C) (03/08 1000) ?Pulse Rate:  [88-121] 109 (03/08 1000) ?Resp:  [14-26] 15 (03/08 1000) ?BP: (110-133)/(57-89) 122/81 (03/08 1000) ?SpO2:  [93 %-100 %] 97 % (03/08 1000) ?Weight:  [41.9 kg] 41.9 kg (03/08 0540) ?Last  BM Date : 05/22/21 ? ? ? ?Physical Exam:  ?General: Alert female in NAD ?Heart:  Regular rate . No lower extremity edema ?Pulmonary: Normal respiratory effort ?Abdomen: Soft, nondistended, nontender. Normal bowel sounds.  ?Neurologic: Alert and oriented ?Psych: Pleasant. Cooperative.  ? ? ?Intake/Output from previous day: ?03/07 0701 - 03/08 0700 ?In: 53 [I.V.:2.5; IV Piggyback:31.5] ?Out: -  ?Intake/Output this shift: ?Total I/O ?In: 120 [P.O.:120] ?Out: -  ? ? ? ?Principal Problem: ?  Symptomatic anemia ?Active Problems: ?  Chronic pain syndrome ?  Essential hypertension ?  Atrial fibrillation/flutter ?  COPD with  emphysema ([place]) ?  Fibromyalgia ?  Chronic hypoxemic respiratory failure (HCC) ?  Anemia, chronic disease ?  UTI (urinary tract infection) ?  Protein-calorie malnutrition, moderate ([place]) ?  Insomnia ?  Bronchiectasis ([place]) ?  Chronic diastolic CHF (congestive heart failure) ([place]) ?  Hyperlipidemia LDL goal <100 ?  Pulmonary MAI (mycobacterium avium-intracellulare) infection ([place]) ?  Leukocytosis ?  Multifocal pneumonia ?  Recurrent falls ?  Pressure injury of skin ? ? ? ? LOS: 5 days  ? ?Tye Savoy ,NP 05/24/2021, 10:36 AM ? ? ? ? ? ? ?

## 2021-05-24 NOTE — Progress Notes (Signed)
Occupational Therapy Treatment ?Patient Details ?Name: [name] ?MRN: [id number] ?DOB: April 28, 1945 ?Today's Date: 05/24/2021 ? ? ?History of present illness Patient is a 76 year old female admitted on 05/31/2021 with worsening back pain and weakness as well as anemia hemoglobin of 6 (has received PRBC) and PNE. CT chest concerning for RUL consolidation concerning for mass. Pt had EGD 05/21/21 and plan for colonscopy 06/13/2021.  PMH:  atrial fibrillation/flutter on Eliquis, CAD, COPD, recent wrist fracture, s/p PPM, MAI infection, chronic pain, bronchiectasis, chronic CHF ?  ?OT comments ? Patient with limited progress today due to fatigue after bed level toileting and need of hygiene and bed mobility.  Pt did agree to bed level bilateral LE and UE exercises with tele monitored and HR from 104-110.  Patient remains limited by continued anemia with Hgb 7.0, generalized weakness and decreased activity tolerance along with deficits noted below. Pt continues to demonstrate good rehab potential and would benefit from continued skilled OT to increase safety and independence with ADLs and functional transfers to allow pt to return home safely and reduce caregiver burden and fall risk. ?  ? ?Recommendations for follow up therapy are one component of a multi-disciplinary discharge planning process, led by the attending physician.  Recommendations may be updated based on patient status, additional functional criteria and insurance authorization. ?   ?Follow Up Recommendations ? Skilled nursing-short term rehab (<3 hours/day)  ?  ?Assistance Recommended at Discharge Frequent or constant Supervision/Assistance  ?Patient can return home with the following ? A lot of help with walking and/or transfers;A lot of help with bathing/dressing/bathroom;Assistance with cooking/housework;Direct supervision/assist for medications management;Direct supervision/assist for financial management;Assist for transportation;Help with stairs or ramp for  entrance ?  ?Equipment Recommendations ? Other (comment)  ?  ?Recommendations for Other Services   ? ?  ?Precautions / Restrictions Precautions ?Precautions: Fall ?Restrictions ?Weight Bearing Restrictions: No  ? ? ?  ? ?Mobility Bed Mobility ?Overal bed mobility: Needs Assistance ?Bed Mobility: Rolling ?Rolling: Min assist ?  ?  ?  ?  ?  ?  ? ?Transfers ?Overall transfer level:  (Pt declined) ?  ?  ?  ?  ?  ?  ?  ?  ?  ?  ?  ?Balance   ?  ?  ?  ?  ?  ?  ?  ?  ?  ?  ?  ?  ?  ?  ?  ?  ?  ?  ?   ? ?ADL either performed or assessed with clinical judgement  ? ?ADL Overall ADL's : Needs assistance/impaired ?  ?  ?  ?  ?  ?  ?  ?  ?  ?  ?Lower Body Dressing: Bed level;Maximal assistance ?Lower Body Dressing Details (indicate cue type and reason): Pt able to bridge but not fully clear hips to allow mesh underwear to be pulled over hips. Pt then able to roll to assist with bringing up all the way in back ?  ?  ?Toileting- Clothing Manipulation and Hygiene: Total assistance;Bed level ?Toileting - Clothing Manipulation Details (indicate cue type and reason): OT entered room and nursing already assisting pt with peri hygiene post bowel movement at bed level.  OT assisted and pt required Total Assist for all hygiene.  Pt declined any other ADLs after hygiene due to fatigue. ?  ?  ?  ?  ?  ? ?Extremity/Trunk Assessment Upper Extremity Assessment ?Upper Extremity Assessment: Generalized weakness ?RUE Deficits / Details: recent wrist  fracture in hard cast ?  ?  ?  ?  ?  ? ?Vision Baseline Vision/History: 1 Wears glasses ?Vision Assessment?: No apparent visual deficits ?  ?Perception Perception ?Perception: Within Functional Limits ?  ?Praxis Praxis ?Praxis: Intact ?  ? ?Cognition Arousal/Alertness: Awake/alert ?Behavior During Therapy: [facility] for tasks assessed/performed ?Overall Cognitive Status: Within Functional Limits for tasks assessed ?  ?  ?  ?  ?  ?  ?  ?  ?  ?  ?  ?  ?  ?  ?  ?  ?General Comments: Distracted by TV. Likes to  have therapy when [employer] and [place] are on. ?  ?  ?   ?Exercises General Exercises - Upper Extremity ?Shoulder Flexion: AROM, Supine, 10 reps ?Elbow Flexion: AROM, Supine, 10 reps, Both ?Elbow Extension: Both, 10 reps, Supine, AROM ?Digit Composite Flexion: AROM, Both, 10 reps, Supine (As able on RT with exposed digits. Cues to slow down to allow for full finger ROM) ?Composite Extension: AROM, Both, 10 reps, Supine ?General Exercises - Lower Extremity ?Hip ABduction/ADduction: AAROM, Right, 5 reps (Pt terminated exercises after 5 reps with RT LE only due to fatigue. HR highest at 110.) ?Straight Leg Raises: AAROM, Both, 10 reps, Supine ?Heel Raises: AAROM, Both, 10 reps ?Other Exercises ?Other Exercises: Pt performed 10 reps submax (due to back apin) bridging exercises in supine. ? ?  ?Shoulder Instructions   ? ? ?  ?General Comments    ? ? ?Pertinent Vitals/ Pain       Pain Assessment ?Faces Pain Scale: Hurts little more ?Pain Location: Back ?Pain Intervention(s): Limited activity within patient's tolerance, Monitored during session (Modified exercises) ? ?Home Living   ?  ?  ?  ?  ?  ?  ?  ?  ?  ?  ?  ?  ?  ?  ?  ?  ?  ?  ? ?  ?Prior Functioning/Environment    ?  ?  ?  ?   ? ?Frequency ? Min 2X/week  ? ? ? ? ?  ?Progress Toward Goals ? ?OT Goals(current goals can now be found in the care plan section) ? Progress towards OT goals: Progressing toward goals ? ?Acute Rehab OT Goals ?Time For Goal Achievement: 06/02/21 ?Potential to Achieve Goals: Good  ?Plan Discharge plan remains appropriate   ? ?Co-evaluation ? ? ?   ?  ?  ?  ?  ? ?  ?AM-PAC OT "6 Clicks" Daily Activity     ?Outcome Measure ? ? Help from another person eating meals?: A Little (Now cleared to advance diet, but unobsereved.) ?Help from another person taking care of personal grooming?: A Little ?Help from another person toileting, which includes using toliet, bedpan, or urinal?: A Lot ?Help from another person bathing (including  washing, rinsing, drying)?: A Lot ?Help from another person to put on and taking off regular upper body clothing?: A Lot ?Help from another person to put on and taking off regular lower body clothing?: A Lot ?6 Click Score: 14 ? ?  ?End of Session Equipment Utilized During Treatment: Oxygen ? ?OT Visit Diagnosis: Unsteadiness on feet (R26.81);Other abnormalities of gait and mobility (R26.89);History of falling (Z91.81);Muscle weakness (generalized) (M62.81) ?  ?Activity Tolerance Patient limited by lethargy;Patient limited by fatigue;Patient limited by pain ?  ?Patient Left in bed;with call bell/phone within reach;with bed alarm set;with family/visitor present ?  ?Nurse Communication   ?  ? ?   ? ?  Time: 4536-4680 ?OT Time Calculation (min): 25 min ? ?Charges: OT General Charges ?$OT Visit: 1 Visit ?OT Treatments ?$Self Care/Home Management : 8-22 mins ?$Therapeutic Exercise: 8-22 mins ? ?Anderson Malta, OT ?Acute Rehab Services ?Office: [phone] ?05/24/2021 ? ?Julien Girt ?05/24/2021, 12:24 PM ?

## 2021-05-24 NOTE — Progress Notes (Addendum)
?Progress Note ? ? ?Patient: [name] NWG:[id number] DOB: 1945-12-25 DOA: 06/14/2021     5 ?DOS: the patient was seen and examined on 05/24/2021 ?  ? ? ? ? ?Brief hospital course: ?[name] is a 76 y.o. female with a history of atrial fibrillation/flutter on Eliquis, CAD, COPD, recent wrist fracture, s/p PPM, MAI infection, chronic pain, bronchiectasis, chronic CHF. Patient presented secondary to worsening back pain and weakness but found to also have a history of melena and concern for upper GI bleeding.  ? ?FOBT was positive in the ED. GI consulted and patient given 2 units of PRBC for a hemoglobin of 6 with symptoms. Incidentally, concern for multifocal pneumonia on chest x-ray. CT chest concerning for RUL consolidation concerning for mass. Pulmonology consulted with recommendation for outpatient PET CT and have transitioned the patient to Levaquin. Upper endoscopy significant for gastritis.  Colonoscopy showed a subtle mucosal irregularity at her anastomosis, biopsied, but no definite source of bleeding. ? ? ? ? ? ?Assessment and Plan: ?* Symptomatic anemia ?Presented with melena and FOBT+ ?GI consulted, and started on PPI.  Transfused 2 units on admission and Hgb down to 7 today ? ?EGD showed gastritis, colonoscopy showed a mucosal defect at her anastomosis but no sign of bleeding.   ? ?Iron replete. ? - Continue Protonix  ?- Transfuse 1 unit today ? ? ? ? ?Multifocal pneumonia ?See summary by Dr. Lonny Prude from 3/7 ?Hx MAI and bronchiectasis.  RVP negative.  Here, no improvement with vanc/Levaquin, WBC rose to 23K, down to 16 today. ? ?- Continue Levaquin 500 mg day 5 of 10, per pulmonology recommendations ?- Follow sputum ?- Consult Pulmonology ? ?UTI (urinary tract infection) ?Diagnosed prior to admission. Urine culture from 2/27 significant for enterococcus faecium ?Currently no symptoms ?- Continue Vancomycin IV day 2 of 3 ? ?Leukocytosis ?This was steadily worsening this week, until today, down to  16K.  My suspicion for empyema is low.  She is not a good candidate for chest tube or decortication.  She also has other potential explanations for leukocytosis ? ?Bronchiectasis ([place]) ?  ? ?Protein-calorie malnutrition, moderate ([place]) ?- Nutrition supplements ? ?Anemia, chronic disease ?Patient follows with hematology/oncology, Dr. Marin Olp. Right port placed in upper chest. Patient receives intermittent blood transfusions. Baseline hemoglobin of about 10. ? ?Today down to 7, symptomatic ? ?Chronic hypoxemic respiratory failure (HCC) ?- Continue home oxygen at 4 L/min ? ?Atrial fibrillation/flutter ?Rate controlled ?-Hold Eliquis ? ?Unstageable pressure ulcer of sacral region [facility]) ?- Continue local wound care ? ?Recurrent falls ?Unsure of etiology. Possibly related to anemia. Patient is also on gabapentin with low renal function. Back pain is also a barrier to successful mobility. PT recommending SNF. ?-Consulted TOC for SNF discharge ? ?Hyperlipidemia LDL goal <100 ?-Continue atorvastatin ? ?Insomnia ?-Hold home Ambien ? ?Fibromyalgia ?See problem, Chronic pain syndrome ? ?COPD with emphysema ([place]) ?No active flare  ?- Continue Incruse Ellipta and albuterol ? ?Essential hypertension ?-Continue furosemide ?-Hold amlodipine ? ?Chronic pain syndrome ?- Continue home Percocet and gabapentin   ? ?Adenocarcinoma, colon ([place]) ?ADDENDUM: new finding on path is adenocA ? ?- CT abdomen and pelvis ?- CEA ?- Consult Palliative Care ? ?Chronic diastolic CHF (congestive heart failure) ([place]) ?Does not appear fluid overloaded ?- Continue Lasix ? ? ? ? ?  ? ?Subjective: Patient is still extremely weak.  Fatigued, no improvement from previous.  No fever.  She has had some intermittent confusion overnight. ? ?Physical Exam: ?Vitals:  ? 05/24/21  0154 05/24/21 0540 05/24/21 1000 05/24/21 1319  ?BP: 127/89 131/76 122/81 108/77  ?Pulse: (!) 121 (!) 111 (!) 109 (!) 110  ?Resp: 18 18 15 18   ?Temp: 98.3 ?F (36.8 ?C) 97.9 ?F (36.6 ?C) (!)  97.4 ?F (36.3 ?C) (!) 97.5 ?F (36.4 ?C)  ?TempSrc: Oral Oral Oral Oral  ?SpO2: 95% 98% 97% 95%  ?Weight:  41.9 kg    ?Height:      ? ?Frail elderly female, lying in bed, appears debilitated ?Normal respiratory rate and rhythm, rales in the right upper lobe, no increased respiratory effort ?RRR, no murmurs, no peripheral edema ?Overall severe loss of subcutaneous muscle mass and fat ?Abdomen soft tenderness palpation or guarding ?Attention seems normal, affect blunted, appears fatigued, generally weak in all 4 extremities, face symmetric and speech fluent ? ? ? ? ?Data Reviewed: ?GI notes reviewed, pulmonology notes reviewed, discussed with pulmonology.  Labs reviewed include iron studies, urine culture, hemograms, metabolic panel ?Hemogram notable for white blood cell count down to 16, hemoglobin down to 7 ? ?Family Communication: Husband at the bedside ? ?Disposition: ?Status is: Inpatient ?Remains inpatient appropriate because: Patient requires ongoing IV antibiotics. ? Planned Discharge Destination: Skilled nursing facility ? ? ? ? ? ?Author: ?Edwin Dada, MD ?05/24/2021 4:14 PM ? ?For on call review [email or web address].  ?

## 2021-05-25 ENCOUNTER — Encounter (HOSPITAL_COMMUNITY): Payer: Self-pay | Admitting: Family Medicine

## 2021-05-25 ENCOUNTER — Inpatient Hospital Stay (HOSPITAL_COMMUNITY): Payer: HMO

## 2021-05-25 ENCOUNTER — Encounter: Payer: Self-pay | Admitting: *Deleted

## 2021-05-25 DIAGNOSIS — D649 Anemia, unspecified: Secondary | ICD-10-CM | POA: Diagnosis not present

## 2021-05-25 DIAGNOSIS — D638 Anemia in other chronic diseases classified elsewhere: Secondary | ICD-10-CM | POA: Diagnosis not present

## 2021-05-25 DIAGNOSIS — Z515 Encounter for palliative care: Secondary | ICD-10-CM

## 2021-05-25 DIAGNOSIS — R918 Other nonspecific abnormal finding of lung field: Secondary | ICD-10-CM | POA: Diagnosis present

## 2021-05-25 DIAGNOSIS — J470 Bronchiectasis with acute lower respiratory infection: Secondary | ICD-10-CM | POA: Diagnosis not present

## 2021-05-25 DIAGNOSIS — B59 Pneumocystosis: Secondary | ICD-10-CM | POA: Diagnosis not present

## 2021-05-25 DIAGNOSIS — C189 Malignant neoplasm of colon, unspecified: Secondary | ICD-10-CM

## 2021-05-25 DIAGNOSIS — Z7189 Other specified counseling: Secondary | ICD-10-CM | POA: Diagnosis not present

## 2021-05-25 DIAGNOSIS — J189 Pneumonia, unspecified organism: Secondary | ICD-10-CM | POA: Diagnosis not present

## 2021-05-25 LAB — CBC
HCT: 27.5 % — ABNORMAL LOW (ref 36.0–46.0)
Hemoglobin: 8.8 g/dL — ABNORMAL LOW (ref 12.0–15.0)
MCH: 28.9 pg (ref 26.0–34.0)
MCHC: 32 g/dL (ref 30.0–36.0)
MCV: 90.5 fL (ref 80.0–100.0)
Platelets: 132 10*3/uL — ABNORMAL LOW (ref 150–400)
RBC: 3.04 MIL/uL — ABNORMAL LOW (ref 3.87–5.11)
RDW: 19.2 % — ABNORMAL HIGH (ref 11.5–15.5)
WBC: 17.2 10*3/uL — ABNORMAL HIGH (ref 4.0–10.5)
nRBC: 0.8 % — ABNORMAL HIGH (ref 0.0–0.2)

## 2021-05-25 LAB — BASIC METABOLIC PANEL
Anion gap: 6 (ref 5–15)
BUN: 27 mg/dL — ABNORMAL HIGH (ref 8–23)
CO2: 27 mmol/L (ref 22–32)
Calcium: 8.9 mg/dL (ref 8.9–10.3)
Chloride: 103 mmol/L (ref 98–111)
Creatinine, Ser: 0.54 mg/dL (ref 0.44–1.00)
GFR, Estimated: >60 mL/min (ref >60)
Glucose, Bld: 110 mg/dL — ABNORMAL HIGH (ref 70–99)
Potassium: 3.8 mmol/L (ref 3.5–5.1)
Sodium: 136 mmol/L (ref 135–145)

## 2021-05-25 MED ORDER — IOHEXOL 300 MG/ML  SOLN
100.0000 mL | Freq: Once | INTRAMUSCULAR | Status: AC | PRN
  Administered 2021-05-25: 12:00:00 75 mL via INTRAVENOUS

## 2021-05-25 MED ORDER — SODIUM CHLORIDE (PF) 0.9 % IJ SOLN
INTRAMUSCULAR | Status: AC
  Filled 2021-05-25: qty 50

## 2021-05-25 MED ORDER — IOHEXOL 9 MG/ML PO SOLN
500.0000 mL | ORAL | Status: AC
  Administered 2021-05-25 (×2): 500 mL via ORAL

## 2021-05-25 MED ORDER — IOHEXOL 9 MG/ML PO SOLN
ORAL | Status: AC
  Filled 2021-05-25: qty 1000

## 2021-05-25 NOTE — Progress Notes (Signed)
Patient is established with this office for IDA. She is currently hospitalized and found to a new diagnosis of colon cancer. Dr Marin Olp notified. She will need follow up in our office to discuss possible treatment. ? ?She is currently hospitalized; appointment made for next Thursday. Will follow up next week once discharged and call patient to discuss appointment.  ? ?Oncology Nurse Navigator Documentation ? ?Oncology Nurse Navigator Flowsheets 05/25/2021  ?Abnormal Finding Date 05/21/2021  ?Confirmed Diagnosis Date 05/31/2021  ?Diagnosis Status Additional Work Up  ?Navigator Follow Up Date: 05/29/2021  ?Navigator Follow Up Reason: Patient Call  ?Navigator Location [facility]  ?Referral Date to RadOnc/MedOnc 05/25/2021  ?Navigator Encounter Type Appt/Treatment Plan Review  ?Patient Visit Type MedOnc  ?Treatment Phase Pre-Tx/Tx Discussion  ?Barriers/Navigation Needs Coordination of Care;Education;Morbidities/Frailty  ?Interventions Coordination of Care  ?Acuity Level 2-Minimal Needs (1-2 Barriers Identified)  ?Coordination of Care Appts  ?Support Groups/Services Friends and Family  ?Time Spent with Patient 30  ?  ?

## 2021-05-25 NOTE — Progress Notes (Signed)
?Progress Note ? ? ?Patient: [name] FGH:[id number] DOB: 1945-03-25 DOA: 06/09/2021     6 ?DOS: the patient was seen and examined on 05/25/2021 ?  ? ? ? ? ?Brief hospital course: ?[name] is a 76 y.o. F with permAF on Eliquis, COPD/bronchiectasis/MAI and chronic respiratory failure on 4L at home, CAD, hx PPM, chronic pain and dCHF who presented with weakness and back pain.  Also found incidentally to report melena and have FOBT in the ER.   ? ? ?3/2: Admitted, started on PPI given melena, transfused 2 units; given antibiotics given multifocal PNA on CXR ?3/3: GI consulted for melena; CT chest showed RUL mass, loculated effusion and lytic lesions scattered in bone --> Pulmonology consulted ?3/4: EGD significant for gastritis only  ?3/7: Colonoscopy showed a subtle mucosal irregularity at her anastomosis, biopsied, but no definite source of bleeding ?3/8: Biopsy from colonoscopy resulted adenoCA ?3/9: Gen Surg, Palliative Care and Oncology consulted ? ? ? ? ? ?Assessment and Plan: ?* Possible right upper lobe pneumonia ?Hx MAI and bronchiectasis.  Here, CT chest showed this chronic subpleural and peripheral infection consistent with previous CT (and known bronchiectasis), but also a new masslike consolidation in the RUL with adjacent loculated effusion.  RVP negative.  S pneumo Uag negative. ? ?Pulm consulted, suspected pneumonia vs. Cancer, recommended Levaquin, outpatient PET.  Deferred thoracentesis.   ? ?Here, she has noted no clinical change with Levaquin. ?- Continue Levaquin 500 mg day 6 of 10  ?  ? ? ? ?Symptomatic anemia ?Acute on chronic anemia due to acute on chronic blood loss was the initial working diagnosis.  Dark black stool in ER and Hgb 6.  GI consulted, and started on PPI.  Transfused 2 units on admission ? ?Suspected source from EGD/colon either gastritis or the colon adenocarcinoma.  Iron replete. ? ?Transfused 3rd unit yesterday ? - Continue Protonix  ? ? ?Mass of upper lobe of right lung  with suspected lytic lesions of bone ?- Consult Oncology, appreciate expertise ?- Consult IR for possible biopsy ? ? ? ?Adenocarcinoma, colon ([place]) ?This was a new and unexpected finding on colonoscopy.  CT abdomen shows further lytic lesions, no soft tissue metastasis.   ?- I would defer work up of this until the lung mass can be differentiated ? ? ? ? ? ?Paroxysmal atrial fibrillation (HCC) ?Rate controlled ?Given recurrent anemia yesterday and unclear prognosis, I favor holding Elqiuis for a short time more ?-Hold Eliquis ? ? ? ? ?Chronic diastolic CHF (congestive heart failure) ([place]) ?Does not appear fluid overloaded ?- Continue Lasix ?-Hold amlodipine ? ?Bronchiectasis ([place]) ?   ? ?Protein-calorie malnutrition, moderate ([place]) ?- Nutrition supplements ? ?UTI (urinary tract infection) ?Diagnosed prior to admission. Urine culture from 2/27 significant for enterococcus faecium ?Currently no symptoms ?- Continue Vancomycin IV day 3 of 3 ? ?Anemia, chronic disease ?Patient follows with hematology/oncology, Dr. Marin Olp. Baseline hemoglobin of about 10. ?-Hold Eliquis ? ?Chronic hypoxemic respiratory failure (HCC) ?- Continue home oxygen at 4 L/min ? ?COPD with emphysema ([place]) ?No active flare  ?- Continue Incruse Ellipta and albuterol ? ?Essential hypertension ?-Continue furosemide ?-Hold amlodipine ? ?Chronic pain syndrome ?- Continue home Percocet and gabapentin   ? ? ? ? ?  ? ?Subjective: Patient extremely weak, has not been out of bed and days, no melena, no fever, no respiratory distress. ? ?Physical Exam: ?Vitals:  ? 05/24/21 2311 05/25/21 0401 05/25/21 0817 05/25/21 1321  ?BP: 130/82 132/71  125/87  ?Pulse: [name])  110 (!) 114  (!) 116  ?Resp: 15 16  18   ?Temp: 97.6 ?F (36.4 ?C) 98 ?F (36.7 ?C)  (!) 97.2 ?F (36.2 ?C)  ?TempSrc: Oral Oral  Oral  ?SpO2: 95% 96% 96% 98%  ?Weight:      ?Height:      ? ?Frail elderly female, lying in bed, appears debilitated ?Normal respiratory rate and rhythm, lung sounds diminished  throughout, no increased respiratory effort ?RRR, no murmurs, no peripheral edema ?Overall severe loss of subcutaneous muscle mass and fat ?Abdomen soft tenderness palpation or guarding ?Attention seems normal, affect blunted, appears fatigued, generally weak in all 4 extremities, face symmetric and speech fluent ? ? ? ? ?Data Reviewed: ?Discussed with oncology, GI.  Vital signs reviewed, nursing notes reviewed, general surgery, GI notes reviewed. ?Labs and imaging reports reviewed. ?Results are notable for electrolytes and renal function normal, white blood cell count up to 17 K, hemoglobin up to 8.8, platelets 132, no change.  CT of the abdomen pelvis report significant for further lytic lesions in the bone ? ?Family Communication: Son at the bedside ? ?Disposition: ?Status is: Inpatient ?Remains inpatient appropriate because: Patient requires significant rehabilitation to return to a point where she could tolerate chemotherapy.  Also requires ongoing goals of care for appropriate disposition ? ? ? Planned Discharge Destination: Skilled nursing facility ? ? ? ? ?Author: ?Edwin Dada, MD ?05/25/2021 2:31 PM ? ?For on call review [email or web address].  ?

## 2021-05-25 NOTE — Progress Notes (Signed)
Nutrition Follow-up ? ?DOCUMENTATION CODES:  ? ?Non-severe (moderate) malnutrition in context of chronic illness ? ?INTERVENTION:  ? ?-Boost Breeze po BID, each supplement provides 250 kcal and 9 grams of protein ? ?-Juven BID, each serving provides 95kcal and 2.5g of protein (amino acids glutamine and arginine)  ? ?-Multivitamin with minerals daily ? ?-Continue Vitamin C and zinc supplementation ? ?NUTRITION DIAGNOSIS:  ? ?Moderate Malnutrition related to chronic illness as evidenced by moderate fat depletion, moderate muscle depletion. ? ?Ongoing. ? ?GOAL:  ? ?Patient will meet greater than or equal to 90% of their needs ? ?Progressing. ? ?MONITOR:  ? ?PO intake, Supplement acceptance, Diet advancement, Labs, Weight trends, Skin ? ?ASSESSMENT:  ? ?76 y.o. female with medical history of afib/flutter on Eliquis, CAD, COPD, recent wrist fracture, s/p PPM, MAI infection, chronic pain, bronchiectasis, GERD, RLS, urinary incontinence, gastroparesis, vitamin B12 deficiency, anemia, arthritis, diverticulitis, fibromyalgia, LBBB, erythropoietin deficiency, HTN, and chronic CHF. She presented to the ED due to worsening back pain and weakness. Concern for melena and upper GIB. In the ED, FOBT was positive and CXR was concerning for PNA. CT chest showed RUL consolidation concerning for mass and Pulmonology was consulted. ? ?Patient currently consuming 10-100% of meals at this time. Accepting Boost Breeze and Juven.  ? ?Copper level resulted on 3/5, elevated at 176. Per RD that initially assessed pt on 3/3, MD was alerted. Noted: receiving zinc supplementation.  ? ?Admission weight: 87 lbs. ?Current weight: 92 lbs. ? ?Medications: Vitamin C, Lasix, Multivitamin with minerals daily, Zinc sulfate, Zofran, Compazine ? ?Labs reviewed. ? ?Diet Order:   ?Diet Order   ? ?       ?  DIET SOFT Room service appropriate? Yes; Fluid consistency: Thin  Diet effective now       ?  ? ?  ?  ? ?  ? ? ?EDUCATION NEEDS:  ? ?Education needs have  been addressed ? ?Skin:  Skin Assessment: Skin Integrity Issues: ?Skin Integrity Issues:: Stage III ?Stage III: sacrum ? ?Last BM:  3/9 ? ?Height:  ? ?Ht Readings from Last 1 Encounters:  ?06/08/2021 5' (1.524 m)  ? ? ?Weight:  ? ?Wt Readings from Last 1 Encounters:  ?05/24/21 41.9 kg  ? ? ?Ideal Body Weight:  45.45 kg ? ?BMI:  Body mass index is 18.04 kg/m?. ? ?Estimated Nutritional Needs:  ? ?Kcal:  1700-1900 kcal ? ?Protein:  80-95 grams ? ?Fluid:  >/= 1.8 L/day ? ? ?Clayton Bibles, MS, RD, LDN ?Inpatient Clinical Dietitian ?Contact information available via [employer] ? ?

## 2021-05-25 NOTE — Progress Notes (Signed)
Physical Therapy Treatment ?Patient Details ?Name: [name] ?MRN: [id number] ?DOB: 1945/08/23 ?Today's Date: 05/25/2021 ? ? ?History of Present Illness Patient is a 76 year old female admitted on 06/16/2021 with worsening back pain and weakness as well as anemia hemoglobin of 6 (has received PRBC) and PNE. CT chest concerning for RUL consolidation concerning for mass. Pt had EGD 05/21/21 and plan for colonscopy 05/17/2021.  PMH:  atrial fibrillation/flutter on Eliquis, CAD, COPD, recent wrist fracture, s/p PPM, MAI infection, chronic pain, bronchiectasis, chronic CHF ? ?  ?PT Comments  ? ? Focus of session today was functional mobility and positional tolerance. The patient tolerated well but had near syncopal episode on first attempt to sit EOB.  Pt. Shows decreased functional mobility, positional tolerance, and vital stability. Strength, ROM, vital stability, and functional endurance are still limiting function.Extensive education on increased LE movement while in bed and upright tolerance performed and acceptable well by pt.  Pt. Would benefit from skilled PT to continue to address these deficits and improve her functional mobility. Plan and discharge setting remains unchanged. Pt to follow acutely as appropriate.  ?  ?Orthostatic BPs ? ?Supine (trendelenburg) 116/80  ?Supine 30 degree 109/78  ?Supine 45 degree 121/81  ?Sitting 9675  ?Sitting after 3 min with arm and leg pumps 128/87  ? ?  ?Recommendations for follow up therapy are one component of a multi-disciplinary discharge planning process, led by the attending physician.  Recommendations may be updated based on patient status, additional functional criteria and insurance authorization. ? ?Follow Up Recommendations ? Skilled nursing-short term rehab (<3 hours/day) ?  ?  ?Assistance Recommended at Discharge Frequent or constant Supervision/Assistance  ?Patient can return home with the following A lot of help with bathing/dressing/bathroom;A lot of help with walking  and/or transfers;Assistance with cooking/housework ?  ?Equipment Recommendations ? Wheelchair (measurements PT);Wheelchair cushion (measurements PT);BSC/3in1  ?  ?Recommendations for Other Services   ? ? ?  ?Precautions / Restrictions Precautions ?Precautions: None ?Restrictions ?Weight Bearing Restrictions: No ?Other Position/Activity Restrictions: Cast on R Wrist  ?  ? ?Mobility ? Bed Mobility ?Overal bed mobility: Needs Assistance ?Bed Mobility: Rolling ?Rolling: Min assist ?Sidelying to sit: Mod assist, HOB elevated ?  ?  ?Sit to sidelying: Mod assist, HOB elevated ?General bed mobility comments: Cues for sequencing LE and trunk managment. Pt. presents with significant orthostatic hypotension when sitting with near syncopal episode. Pt. laid down and BP checked after reports of increased dizziness and reduced response. Pt. repositioned to trendelenburg and BP returned to 116/80. HOB gradually elevated, 30 degrees (109/78), 45 (121/81) with increased LE movements, sitting (96/75) with reported dizziness, after arm and leg pumps in sitting (128/87). Pt. able to tolerated sitting for roughly ~3 min before fatigue. Pt. repositioned to L sidelying to off-load sacrum. ?  ? ?Transfers ?  ?  ?  ?  ?  ?  ?  ?  ?  ?  ?  ? ?Ambulation/Gait ?  ?  ?  ?  ?  ?  ?  ?  ? ? ?Stairs ?  ?  ?  ?  ?  ? ? ?Wheelchair Mobility ?  ? ?Modified Rankin (Stroke Patients Only) ?  ? ? ?  ?Balance Overall balance assessment: Needs assistance, History of Falls ?Sitting-balance support: Feet supported, No upper extremity supported ?Sitting balance-Leahy Scale: Fair ?Sitting balance - Comments: Pt. in slumped posture and reports fatigue after ~3 min, then laid back down. Orthosatic hypotension upon sitting. ?  ?  ?  ?  ?  ?  ?  ?  ?  ?  ?  ?  ?  ?  ?  ?  ? ?  ?  Cognition Arousal/Alertness: Awake/alert ?  ?Overall Cognitive Status: Within Functional Limits for tasks assessed ?  ?  ?  ?  ?  ?  ?  ?  ?  ?  ?  ?  ?  ?  ?  ?  ?General Comments: Easily  distracted throughout session but can quickly reorient ?  ?  ? ?  ?Exercises General Exercises - Upper Extremity ?Shoulder Flexion: AROM, Both, Seated, 10 reps ?General Exercises - Lower Extremity ?Ankle Circles/Pumps: AROM, Both, 20 reps ?Quad Sets: AROM, Both, 20 reps (Pushing into pillows at bottom of bed) ?Heel Slides: AROM, Both, 10 reps ? ?  ?General Comments General comments (skin integrity, edema, etc.): Pt's spouse present and encouraging her througout the session ?  ?  ? ?Pertinent Vitals/Pain Pain Assessment ?Pain Assessment: No/denies pain  ? ? ?Home Living   ?  ?  ?  ?  ?  ?  ?  ?  ?  ?   ?  ?Prior Function    ?  ?  ?   ? ?PT Goals (current goals can now be found in the care plan section) Acute Rehab PT Goals ?Patient Stated Goal: get stronger ?PT Goal Formulation: With patient/family ?Time For Goal Achievement: 06/02/21 ?Potential to Achieve Goals: Fair ?Progress towards PT goals: Not progressing toward goals - comment (Pt. shows overall decreased in positional tolerance and functional capability. Suspect due to new diagnosis of colon cancer.) ? ?  ?Frequency ? ? ? Min 2X/week ? ? ? ?  ?PT Plan Current plan remains appropriate  ? ? ?Co-evaluation   ?  ?  ?  ?  ? ?  ?AM-PAC PT "6 Clicks" Mobility   ?Outcome Measure ? Help needed turning from your back to your side while in a flat bed without using bedrails?: A Little ?Help needed moving from lying on your back to sitting on the side of a flat bed without using bedrails?: A Lot ?Help needed moving to and from a bed to a chair (including a wheelchair)?: Total ?Help needed standing up from a chair using your arms (e.g., wheelchair or bedside chair)?: Total ?Help needed to walk in hospital room?: Total ?Help needed climbing 3-5 steps with a railing? : Total ?6 Click Score: 9 ? ?  ?End of Session   ?Activity Tolerance: Patient limited by fatigue ?Patient left: in bed;with call bell/phone within reach;with bed alarm set;with family/visitor present ?Nurse  Communication: Mobility status ?PT Visit Diagnosis: Other abnormalities of gait and mobility (R26.89);Muscle weakness (generalized) (M62.81) ?  ? ? ?Time:  -  ?  ? ?Charges:             ?          ? ?Thermon Leyland, SPT ?Acute Rehab Services ? ? ? ?Thermon Leyland ?05/25/2021, 10:49 AM ? ?

## 2021-05-25 NOTE — TOC Progression Note (Addendum)
Transition of Care (TOC) - Progression Note  ? ? ?Patient Details  ?Name: SEVANA GRANDINETTI ?MRN: [id number] ?Date of Birth: 12-Jun-1945 ? ?Transition of Care (TOC) CM/SW Contact  ?Stiles Maxcy, Marjie Skiff, RN ?Phone Number: ?05/25/2021, 1:13 PM ? ?Clinical Narrative:    ?SNF bed offers provided to pt and husband at bedside. [employer]. Bed accepted at [facility]. Voicemails x2 left for [facility] RNs to start auth (10:42 and 10:45). No return phone call received yet. Also called On Call RN phone with no answer or ability to leave voicemail. TOC will continue to follow. ? ?Addendum 14:47: After email with leadership I received return call from [place]. Tammy to begin insurance auth for SNF and PTAR. ? ?Expected Discharge Plan: [place] ?Barriers to Discharge: Continued Medical Work up ? ?Expected Discharge Plan and Services ?Expected Discharge Plan: [place] ?  ?Discharge Planning Services: CM Consult ?  ?Living arrangements for the past 2 months: [place] ?                ?  ?Readmission Risk Interventions ?No flowsheet data found. ? ?

## 2021-05-25 NOTE — Care Management Important Message (Signed)
Important Message ? ?Patient Details IM Letter placed in Patients room. ?Name: [name] ?MRN: [id number] ?Date of Birth: 12/23/1945 ? ? ?Medicare Important Message Given:  Yes ? ? ? ? ?Kerin Salen ?05/25/2021, 8:57 AM ?

## 2021-05-25 NOTE — Progress Notes (Signed)
I spoke with Oncology ( Dr. Burr Medico). She will get patient a close follow up with Dr. Marin Olp who follows her for anemia. Staging CT scans have already been ordered inpatient ?

## 2021-05-25 NOTE — Consult Note (Signed)
[name] 1945/09/24  [id number].    Requesting MD: Dr. 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 Marin Olp, a fib on eliquis, multiple prior abdominal surgeries with significant colon resections and OR for SBO's (apparently she was told after her last surgery that she had a significant amount of adhesions and further surgeries would not be advised), HTN, CAD, GERD, CHF, fibromyalgia, and pacemaker who presented to the ED secondary to worsening back pain and weakness.  She admitted to the dark stools and she was found to be heme + in her stool.  She was admitted and given 2 units of pRBCs for a hgb of 6.  She was also noted to have multifocal PNA and a CT scan was done which revealed a RUL consolidation concerning for a mass.  She was evaluated by pulmonology who recommended an outpatient PET CT and was transitioned to levaquin.  GI was also consulted and an upper endo was done revealing gastritis, but a colonoscopy was also done as well.  This revealed a subtle nodularity at the ileosigmoid anastomosis.  This was biopsied and pathology revealed adenocarcinoma with signet ring cell features.  No obvious bleeding was noted at the site of this nodularity.  We have been asked to see the patient for further evaluation and recommendations.    Lives at home with her husband. Planning SNF upon discharge from the hospital  ROS: Review of Systems  Constitutional:  Positive for malaise/fatigue and weight loss.  Respiratory:  Positive for shortness of breath.   Gastrointestinal:  Positive for melena and nausea. Negative for abdominal pain and vomiting. : Please see HPI, otherwise all other systems have been reviewed and are negative.   Family History  Problem  Relation Age of Onset   Heart disease Father    Stroke Mother    Heart disease Mother    Heart disease Brother    Breast cancer Maternal Aunt    Heart disease Paternal Aunt    Diabetes Paternal Grandfather    Colon cancer Neg Hx     Past Medical History:  Diagnosis Date   Anemia    Arthritis    Atrial fibrillation ([place])    CAD (coronary artery disease)    Mild disease per cath in 2011   COPD (chronic obstructive pulmonary disease) ([place])    Home O2   Diverticulitis    Elbow fracture, left aug 2012   Erythropoietin deficiency anemia 06/19/2018   Essential hypertension    Fibromyalgia    Gastroparesis    GERD (gastroesophageal reflux disease)    H/O: GI bleed    from Pradaxa   Hepatitis    Hx of in high school    History of pneumonia    Recurrent   Hx MRSA infection    Hx of gastric ulcer    Hx of stress fx 10/2009   Right hip    Iron malabsorption 08/14/2017   LBBB (left bundle branch block)    Oxygen dependent    2 liters via nasal cannula at all times   Pacemaker    CRT therapy; followed by Dr. Caryl Comes   Pleural effusion     s/p right thoracentesis 03/09   Primary dilated cardiomyopathy (HCC)    EF 45 to 50% per echo in  Jan 2012   RLS (restless legs syndrome)    Silent aspiration    Small bowel obstruction (HCC)    Urinary incontinence    Venous embolism and thrombosis of subclavian vein ([place])    After pacemaker insertion Oct 2011   Vitamin B12 deficiency    Wears glasses    Reading    Past Surgical History:  Procedure Laterality Date   APPENDECTOMY     BIOPSY  05/21/2021   Procedure: BIOPSY;  Surgeon: Thornton Park, MD;  Location: [facility];  Service: Gastroenterology;;   [place] N/A 06/19/2019   Procedure: BIV PACEMAKER GENERATOR CHANGEOUT;  Surgeon: Deboraha Sprang, MD;  Location: [facility];  Service: Cardiovascular;  Laterality: N/A;   BRONCHIAL WASHINGS  05/25/2019   Procedure: BRONCHIAL WASHINGS;  Surgeon: Chesley Mires, MD;  Location: [facility];  Service: Endoscopy;;   CARDIAC CATHETERIZATION  04/08/2006, 11/16/2009   CHOLECYSTECTOMY N/A 06/27/2017   Procedure: OPEN CHOLECYSTECTOMY;  Surgeon: Ralene Ok, MD;  Location: [facility];  Service: General;  Laterality: N/A;   COLECTOMY     Intestinal resection (5times)   COLECTOMY  02/04/2000   ex. lap., intra-abd. subtotal colectomy with ileosigmoid colon anastomosies and lysis of adhesions   COLONOSCOPY WITH PROPOFOL N/A 06/04/2021   Procedure: COLONOSCOPY WITH PROPOFOL;  Surgeon: Thornton Park, MD;  [facility];  Service: Gastroenterology;  Laterality: N/A;   CYSTOSCOPY  11/11/2008   CYSTOSCOPY W/ RETROGRADES  11/11/2008   right   CYSTOSCOPY WITH INJECTION  04/30/2006   transurethral collagen injection; incision vaginal stenosis   ERCP N/A 06/23/2017   Procedure: ENDOSCOPIC RETROGRADE CHOLANGIOPANCREATOGRAPHY (ERCP);  Surgeon: Carol Ada, MD;  Location: [facility];  Service: Endoscopy;  Laterality: N/A;   ERCP N/A 06/24/2017   Procedure: ENDOSCOPIC RETROGRADE CHOLANGIOPANCREATOGRAPHY (ERCP);  Surgeon: [name], MD;  Location: [facility];  Service: Endoscopy;  Laterality: N/A;   ESOPHAGOGASTRODUODENOSCOPY N/A 02/02/2013   Procedure: ESOPHAGOGASTRODUODENOSCOPY (EGD);  Surgeon: Lafayette Dragon, MD;  Location: [facility];  Service: Endoscopy;  Laterality: N/A;   ESOPHAGOGASTRODUODENOSCOPY (EGD) WITH PROPOFOL N/A 05/22/2021   Procedure: ESOPHAGOGASTRODUODENOSCOPY (EGD) WITH PROPOFOL;  Surgeon: Juanita Craver, MD;  [facility];  Service: Gastroenterology;  Laterality: N/A;   EXPLORATORY LAPAROTOMY  04/27/2009   lysis of adhesions, gastrostomy tube   GASTROCUTANEOUS FISTULA CLOSURE     INTERSTIM IMPLANT PLACEMENT  05/28/2006 - stage I   06/05/2006 - stage II   INTERSTIM IMPLANT PLACEMENT  02/05/2012   Procedure: [name] IMPLANT FIRST STAGE;  Surgeon: Reece Packer, MD;  [facility];  Service: Urology;  Laterality: N/A;   Replacement of Interstim Lead      INTERSTIM IMPLANT REVISION  03/06/2011   Procedure: REVISION OF [name];  Surgeon: Reece Packer, MD;  [facility];  Service: Urology;  Laterality: N/A;  Replacement of Neurostimulator   INTERSTIM IMPLANT REVISION  10/23/2007   IR IMAGING GUIDED PORT INSERTION  10/14/2017   PACEMAKER PLACEMENT  12/21/2009   Peg removed     with complications   PORT-A-CATH REMOVAL  07/27/2011   Procedure: REMOVAL PORT-A-CATH;  Surgeon: Rolm Bookbinder, MD;  Location: [facility];  Service: General;  Laterality: N/A;   PORTACATH PLACEMENT  12/16/2009   SAVORY DILATION N/A 02/02/2013   Procedure: SAVORY DILATION;  Surgeon: Lafayette Dragon, MD;  [facility];  Service: Endoscopy;  Laterality: N/A;   SMALL INTESTINE SURGERY  05/20/2001   ex. lap., resection of small bowel stricture; gastrostomy; insertion  central line   TEE WITHOUT CARDIOVERSION N/A 07/17/2019   Procedure: TRANSESOPHAGEAL ECHOCARDIOGRAM (TEE);  Surgeon: Donato Heinz, MD;  Location: [facility]COPY;  Service: Cardiovascular;  Laterality: N/A;   TONSILLECTOMY  age 36   TOTAL ABDOMINAL HYSTERECTOMY     complete   TOTAL HIP ARTHROPLASTY  08/03/2011   Procedure: TOTAL HIP ARTHROPLASTY ANTERIOR APPROACH;  Surgeon: Mcarthur Rossetti, MD;  [facility];  Service: Orthopedics;  Laterality: Right;   VIDEO BRONCHOSCOPY N/A 05/25/2019   Procedure: VIDEO BRONCHOSCOPY WITHOUT FLUORO;  Surgeon: Chesley Mires, MD;  [facility];  Service: Endoscopy;  Laterality: N/A;    Social History:  reports that she quit smoking about 35 years ago. Her smoking use included cigarettes. She has a 20.00 pack-year smoking history. She has never used smokeless tobacco. She reports that she does not drink alcohol and does not use drugs.  Allergies:  Allergies  Allergen Reactions   Dabigatran Etexilate Mesylate Other (See Comments)    INTERNAL BLEEDING- Pradaxa   Augmentin  [Amoxicillin-Pot Clavulanate] Diarrhea and Nausea Only    Did it involve swelling of the face/tongue/throat, SOB, or low BP? No Did it involve sudden or severe rash/hives, skin peeling, or any reaction on the inside of your mouth or nose? No Did you need to seek medical attention at a hospital or doctor's office? No When did it last happen? 02/2019 If all above answers are "NO", may proceed with cephalosporin use.    Talwin [Pentazocine] Other (See Comments)    Hallucinations     Facility-Administered Medications Prior to Admission  Medication Dose Route Frequency Provider Last Rate Last Admin   methylPREDNISolone acetate (DEPO-MEDROL) injection 80 mg  80 mg Other Once Magnus Sinning, MD       Medications Prior to Admission  Medication Sig Dispense Refill   acetaminophen (TYLENOL) 650 MG CR tablet Take 650 mg by mouth every 8 (eight) hours as needed for pain.     albuterol (VENTOLIN HFA) 108 (90 Base) MCG/ACT inhaler Inhale 2 puffs into the lungs every 6 (six) hours as needed for wheezing or shortness of breath.     amLODipine (NORVASC) 2.5 MG tablet TAKE 1 TABLET BY MOUTH AT BEDTIME (Patient taking differently: Take 2.5 mg by mouth at bedtime.) 90 tablet 0   apixaban (ELIQUIS) 5 MG TABS tablet Take 1 tablet (5 mg total) by mouth 2 (two) times daily. 180 tablet 0   atorvastatin (LIPITOR) 20 MG tablet TAKE 1 TABLET BY MOUTH ONCE DAILY AT  6PM (Patient taking differently: Take 20 mg by mouth every evening.) 90 tablet 1   gabapentin (NEURONTIN) 600 MG tablet TAKE 1 TABLET BY MOUTH ONCE DAILY IN THE MORNING, AND  AT  NOON,  AND  IN  THE  EVENING,  AND  AT  BEDTIME (Patient taking differently: Take 600 mg by mouth See admin instructions. TAKE 600mg   BY MOUTH ONCE DAILY IN THE MORNING, AND AT NOON, AND IN THE EVENING, AND AT BEDTIME) 120 tablet 0   levofloxacin (LEVAQUIN) 500 MG tablet Take 1 tablet (500 mg total) by mouth daily. 3 tablet 0   oxyCODONE-acetaminophen (PERCOCET) 10-325 MG tablet  Take 1 tablet by mouth every 4 (four) hours as needed. PAIN 120 tablet 0   OXYGEN Inhale 4 L/min into the lungs continuous.     pantoprazole (PROTONIX) 40 MG tablet Take 1 tablet (40 mg total) by mouth 2 (two) times daily. (Patient taking differently: Take 40 mg by mouth daily.) 180 tablet 3  promethazine (PHENERGAN) 25 MG tablet Take 1 tablet (25 mg total) by mouth every 6 (six) hours as needed for nausea or vomiting. (Patient taking differently: Take 12.5-25 mg by mouth every 6 (six) hours as needed for nausea or vomiting.) 30 tablet 0   tiZANidine (ZANAFLEX) 4 MG tablet Take 1 tablet (4 mg total) by mouth 3 (three) times daily as needed. for muscle spams 90 tablet 2   triamcinolone ointment (KENALOG) 0.1 % Apply 1 application topically daily as needed (to affected areas- for psoriasis).      umeclidinium bromide (INCRUSE ELLIPTA) 62.5 MCG/ACT AEPB Inhale 1 puff into the lungs daily. (Patient taking differently: Inhale 1 puff into the lungs daily as needed (for shortness of breath).) 90 each 3   zolpidem (AMBIEN) 5 MG tablet TAKE 1 TABLET BY MOUTH EVERY DAY AT BEDTIME AS NEEDED (Patient taking differently: Take 5 mg by mouth at bedtime as needed for sleep.) 30 tablet 2   furosemide (LASIX) 40 MG tablet TAKE 1 TABLET BY MOUTH ONCE DAILY IN THE MORNING (Patient not taking: Reported on 05/31/2021) 90 tablet 0   nitroGLYCERIN (NITROSTAT) 0.4 MG SL tablet Place 1 tablet (0.4 mg total) under the tongue every 5 (five) minutes as needed. CHEST PAIN (Patient not taking: Reported on 06/09/2021) 25 tablet 6     Physical Exam: Blood pressure 132/71, pulse (!) 114, temperature 98 F (36.7 C), temperature source Oral, resp. rate 16, height 5' (1.524 m), weight 41.9 kg, SpO2 96 %. General: chronically ill appearing white female who is laying in bed in NAD HEENT: head is normocephalic, atraumatic.  Sclera are noninjected.  PERRL.  Ears and nose without any masses or lesions.  Mouth is pink and moist Heart: regular,  rate, and rhythm.  No obvious murmurs, gallops, or rubs noted.   Lungs: CTAB, no wheezes or rhonchi noted.  Respiratory effort nonlabored on 4L [place]. Port in place right chest Abd: soft, NT, ND, +BS, no masses, hernias, or organomegaly MS: short arm cast to RUE. Calves soft and nontender without edema Skin: warm and dry with no masses, lesions, or rashes Neuro: Cranial nerves 2-12 grossly intact, sensation is normal throughout Psych: A&Ox3 with an appropriate affect.   Results for orders placed or performed during the hospital encounter of 05/25/2021 (from the past 48 hour(s))  Surgical pathology     Status: None   Collection Time: 06/02/2021 11:47 AM  Result Value Ref Range   SURGICAL PATHOLOGY      SURGICAL PATHOLOGY CASE: [id number] PATIENT: [facility]ology Report     Clinical History: Symptomatic anemia, blood in stool (crm)     FINAL MICROSCOPIC DIAGNOSIS:  A. ANASTOMOSIS, BIOPSY: - Poorly differentiated adenocarcinoma with focal signet ring cell features.  See comment      COMMENT:  Dr. Saralyn Pilar reviewed the case and concurs with the diagnosis.  Dr. Tarri Glenn was notified on 05/24/2021.     GROSS DESCRIPTION:  Received in formalin are tan, soft tissue fragments that are submitted in toto. Number: Multiple size: Range from 0.2 to 0.5 cm blocks: 1 Craig Staggers 05/28/2021)    Final Diagnosis performed by Jaquita Folds, MD.   Electronically signed 05/24/2021 Technical and / or Professional components performed at [facility], [place] [address], [place], [place] [address].  Immunohistochemistry Technical component (if applicable) was performed at [facility]. [address], [place], [place], [place] [address].   IMMUNOHISTOCHEMISTRY DISCLAIMER (if applicable): Some of these immunohistochemical stains may have been  developed and the performance characteristics determine by [facility]. Some may not have  been cleared or approved by the U.S. Food and Drug Administration. The FDA has determined that such clearance or approval is not necessary. This test is used for clinical purposes. It should not be regarded as investigational or for research. This laboratory is certified under the [place] (CLIA-88) as qualified to perform high complexity clinical laboratory testing.  The controls stained appropriately.   CBC     Status: Abnormal   Collection Time: 05/24/21 10:43 AM  Result Value Ref Range   WBC 16.6 (H) 4.0 - 10.5 K/uL   RBC 2.48 (L) 3.87 - 5.11 MIL/uL   Hemoglobin 7.0 (L) 12.0 - 15.0 g/dL   HCT 22.5 (L) 36.0 - 46.0 %   MCV 90.7 80.0 - 100.0 fL   MCH 28.2 26.0 - 34.0 pg   MCHC 31.1 30.0 - 36.0 g/dL   RDW 20.2 (H) 11.5 - 15.5 %   Platelets 137 (L) 150 - 400 K/uL   nRBC 0.4 (H) 0.0 - 0.2 %    Comment: Performed at [facility], [place] [address] [place], [place] [address]  Prepare RBC (crossmatch)     Status: None   Collection Time: 05/24/21  4:54 PM  Result Value Ref Range   Order Confirmation      ORDER PROCESSED BY BLOOD BANK Performed at [facility], [place] [address], [place], [place] [address]   Type and screen [place]     Status: None   Collection Time: 05/24/21  4:54 PM  Result Value Ref Range   ABO/RH(D) A NEG    Antibody Screen NEG    Sample Expiration 05/27/2021,2359    Unit Number [id number]    Blood Component Type RED CELLS,LR    Unit division 00    Status of Unit ISSUED,FINAL    Transfusion Status OK TO TRANSFUSE    Crossmatch Result      Compatible Performed at [facility], [place] [address], [place], [place] [address]   CBC     Status: Abnormal   Collection Time: 05/25/21  3:59 AM  Result Value Ref Range   WBC 17.2 (H) 4.0 - 10.5 K/uL   RBC 3.04 (L) 3.87 - 5.11 MIL/uL   Hemoglobin 8.8 (L) 12.0 - 15.0 g/dL    Comment: POST TRANSFUSION  SPECIMEN   HCT 27.5 (L) 36.0 - 46.0 %   MCV 90.5 80.0 - 100.0 fL   MCH 28.9 26.0 - 34.0 pg   MCHC 32.0 30.0 - 36.0 g/dL   RDW 19.2 (H) 11.5 - 15.5 %   Platelets 132 (L) 150 - 400 K/uL   nRBC 0.8 (H) 0.0 - 0.2 %    Comment: Performed at [facility], [place] [address], [place], [place] [address]  Basic metabolic panel     Status: Abnormal   Collection Time: 05/25/21  3:59 AM  Result Value Ref Range   Sodium 136 135 - 145 mmol/L   Potassium 3.8 3.5 - 5.1 mmol/L   Chloride 103 98 - 111 mmol/L   CO2 27 22 - 32 mmol/L   Glucose, Bld 110 (H) 70 - 99 mg/dL    Comment: Glucose reference range applies only to samples taken after fasting for at least 8 hours.   BUN 27 (H) 8 - 23 mg/dL   Creatinine, Ser 0.54 0.44 - 1.00 mg/dL   Calcium  8.9 8.9 - 10.3 mg/dL   GFR, Estimated >60 >60 mL/min    Comment: (NOTE) Calculated using the CKD-EPI Creatinine Equation (2021)    Anion gap 6 5 - 15    Comment: Performed at [facility], [place] [address], [name], [place] [address]   *Note: Due to a large number of results and/or encounters for the requested time period, some results have not been displayed. A complete set of results can be found in Results Review.   No results found.  Anti-infectives (From admission, onward)    Start     Dose/Rate Route Frequency Ordered Stop   05/24/21 1200  vancomycin (VANCOREADY) IVPB 500 mg/100 mL        500 mg 100 mL/hr over 60 Minutes Intravenous Every 24 hours 06/04/2021 1046 06/14/2021 1159   06/07/2021 1145  vancomycin (VANCOREADY) IVPB 750 mg/150 mL        750 mg 150 mL/hr over 60 Minutes Intravenous  Once 06/04/2021 1045 05/27/2021 1600   05/19/21 2200  cefTRIAXone (ROCEPHIN) 1 g in sodium chloride 0.9 % 100 mL IVPB  Status:  Discontinued        1 g 200 mL/hr over 30 Minutes Intravenous Every 24 hours 05/28/2021 2213 05/19/21 1203   05/19/21 2200  azithromycin (ZITHROMAX) 500 mg in sodium chloride 0.9 % 250 mL IVPB  Status:   Discontinued        500 mg 250 mL/hr over 60 Minutes Intravenous Every 24 hours 06/12/2021 2213 05/19/21 1203   05/19/21 1300  levofloxacin (LEVAQUIN) tablet 500 mg        500 mg Oral Daily 05/19/21 1202 05/29/21 0959   06/10/2021 1745  cefTRIAXone (ROCEPHIN) 1 g in sodium chloride 0.9 % 100 mL IVPB        1 g 200 mL/hr over 30 Minutes Intravenous  Once 05/19/2021 1732 05/19/2021 2353   05/24/2021 1745  azithromycin (ZITHROMAX) 500 mg in sodium chloride 0.9 % 250 mL IVPB        500 mg 250 mL/hr over 60 Minutes Intravenous  Once 05/25/2021 1732 05/19/21 0100         Assessment/Plan Adenocarcinoma of colon with signet ring cell features The patient has a complicated past medical history and past surgical history.  Unfortunately, the patient is chronically ill appearing with multiple medical problems including a possible new bronchogenic carcinoma as well.  She is currently not obstructed or likely bleeding from this small area so it is not an emergent thing to operate on.  It allows for determination of goals of care for the patient as well as if she is pulmonary wise able to undergo an operation.  She has a CEA pending and already has a CT of her chest.  She has a CT A/P ordered as well which will also help in guidance pending findings.  She may need to be discussed at tumor board if she wants to proceed with surgery and felt safe to do so. Also agree with palliative medicine involvement for goals of care discussions once we get some more information.  FEN - soft diet VTE - SCDs only due to acute on chronic anemia ID - currently levaquin 3/3>>  COPD/emphysema/MAI/new pulmonary mass on 4L O2 chronically Erythropoietin anemia followed by oncology Dr. Marin Olp A fib/flutter on eliquis CHF Multifocal pneumonia UTI Protein calorie malnutrition Recurrent falls, deconditioned Fibromyalgia/ chronic pain  I reviewed Consultant gastroenterology notes, hospitalist notes, last 24 h vitals and pain scores,  last 48 h intake  and output, last 24 h labs and trends, and last 24 h imaging results  Margie Billet, [facility] 05/25/2021, 11:25 AM Please see Amion for pager number during day hours 7:00am-4:30pm or 7:00am -11:30am on weekends

## 2021-05-25 NOTE — Assessment & Plan Note (Deleted)
-   I would defer work up of this until the lung mass can be differentiated ?

## 2021-05-25 NOTE — Progress Notes (Addendum)
? ? ? ?Progress Note ? ?Hospital Day: 8 ? ?Chief Complaint: anemia, new colon cancer ? ?Assessment and Plan  ? ?Brief History ? ?76 yo female with HTN, Afib / Aflutter on Eliquis , pulmonary MAI, chronic hypoxemic respiratory failure, CAD, CHF, GERD, prior SBO's, fibromyalgia, chronic pain. Admitted 3/2 with multifocal pneumonia, black stools and symptomatic acute on chronic anemia.  ?   ?# Chronic anemia followed by Hematology. Presented with acute drop in hgb from 9.8 >> 6 and black heme positive stool on eliquis.     ?--EGD this admission unrevealing. Colonoscopy >> subtle mucosal irregularity at the anastomosis, biopsies + - Poorly differentiated adenocarcinoma with focal signet ring cell  features.  The acute worsening of anemia / melena possibly related to the colon lesion.   ?--Hgb improved from 6 to 8.2 after 2 units of blood but she required a 3rd unit last night after hgb fell to 7. Hgb now up to 8.8.  ?--Discussed colon biopsy results with husband and patient today.  ?--Will consult Oncology and General Surgery ?--I had made an office follow up with Dr. Carlean Purl on 4/11 but that probably will not be necessary now. I informed the patient that I was going to cancel the follow up.  ? ?# Multifocal PNA. History of pulmonary MAI (mycobacterium avium-intracellulare) infection. New mass like consolidation of right upper lobe of lung. Lytic lesions on CT scan. Pulmonary recommending outpatient PET scan  ?   ? ?Subjective  ? ?Was having a difficult time breathing earlier this am but thinks it is because she was laying flat. Breathing back to baseline now. No BMs or GI bleeding.  ? ? ?Objective  ? ? ?Endoscopic Studies this admission ?05/21/21 EGD ?--mild gastritis ?  ?06/03/2021 colonoscopy ?--complete exam, good prep. Subtotal colectomy with anastomosis located 20 cm from the anal verge. ?- Sublte, mucosal irregularity at the anastomosis. Unclear clinical significance. Biopsied. ?- The examined portion of the ileum was  normal. ?- No evidence for active or recent bleeding ? FINAL MICROSCOPIC DIAGNOSIS:  ?A. ANASTOMOSIS, BIOPSY:  ?- Poorly differentiated adenocarcinoma with focal signet ring cell  ?features.   ? ?IMAGING:   ? ?Chest CT ? ?IMPRESSION: ?Interval development of masslike consolidation within the a right ?upper lobe in an area of more focal consolidation noted on prior ?examination suspicious for primary bronchogenic malignancy. ?Associated shotty right pre-vascular, and periaortic adenopathy as ?well as necrotic right hilar adenopathy. Interval development of ?numerous lytic lesions throughout the visualized axial skeleton. PET ?CT examination may be helpful for further evaluation. ?  ?Superimposed changes of a chronic atypical infection or infectious ?bronchiolitis with bronchiolectasis, airway impaction, and ?peripheral subpleural consolidation reticulation grossly stable ?since prior examination. ?  ?Interval development of partially loculated right pleural effusion. ?Diagnostic thoracentesis may be helpful for further management. ?  ?Extensive multi-vessel coronary artery calcification. Morphologic ?changes in keeping with pulmonary arterial hypertension. ?  ?Aortic Atherosclerosis (ICD10-I70.0). ? ?Lab Results: ?Recent Labs  ?  06/16/2021 ?0852 05/24/21 ?1043 05/25/21 ?0359  ?WBC 23.6* 16.6* 17.2*  ?HGB 8.2* 7.0* 8.8*  ?HCT 25.1* 22.5* 27.5*  ?PLT 159 137* 132*  ? ?BMET ?Recent Labs  ?  05/25/21 ?0359  ?NA 136  ?K 3.8  ?CL 103  ?CO2 27  ?GLUCOSE 110*  ?BUN 27*  ?CREATININE 0.54  ?CALCIUM 8.9  ? ?  ? ?Scheduled inpatient medications:  ? vitamin C  250 mg Oral BID  ? atorvastatin  20 mg Oral QHS  ? Chlorhexidine Gluconate Cloth  6 each Topical Daily  ? collagenase   Topical Daily  ? feeding supplement  1 Container Oral BID BM  ? furosemide  40 mg Oral Daily  ? gabapentin  300 mg Oral BID  ? And  ? gabapentin  600 mg Oral QHS  ? iohexol  500 mL Oral Q1H  ? levofloxacin  500 mg Oral Daily  ? multivitamin with minerals  1  tablet Oral Daily  ? nutrition supplement (JUVEN)  1 packet Oral BID BM  ? nystatin   Topical TID  ? pantoprazole  40 mg Oral Daily  ? sodium chloride flush  10-40 mL Intracatheter Q12H  ? umeclidinium bromide  1 puff Inhalation Daily  ? zinc sulfate  220 mg Oral Daily  ? ?Continuous inpatient infusions:  ? vancomycin 500 mg (05/24/21 1119)  ? ?PRN inpatient medications: oxyCODONE **AND** acetaminophen, albuterol, hydrALAZINE, morphine injection, ondansetron **OR** ondansetron (ZOFRAN) IV, prochlorperazine, sodium chloride flush, zolpidem ? ?Vital signs in last 24 hours: ?Temp:  [97.4 ?F (36.3 ?C)-98 ?F (36.7 ?C)] 98 ?F (36.7 ?C) (03/09 0401) ?Pulse Rate:  [108-120] 114 (03/09 0401) ?Resp:  [15-19] 16 (03/09 0401) ?BP: (108-148)/(71-82) 132/71 (03/09 0401) ?SpO2:  [95 %-97 %] 96 % (03/09 0817) ?FiO2 (%):  [36 %] 36 % (03/09 0817) ?Last BM Date : 05/25/21 ? ?Intake/Output Summary (Last 24 hours) at 05/25/2021 1025 ?Last data filed at 05/24/2021 2305 ?Gross per 24 hour  ?Intake 1040 ml  ?Output 375 ml  ?Net 665 ml  ? ? ? ?Physical Exam:  ?General: Alert female in NAD ?Heart:  Regular rate and rhythm. No lower extremity edema ?Pulmonary: Normal respiratory effort. RLL crackles ?Abdomen: Soft, nondistended, nontender. Normal bowel sounds.  ?Neurologic: Alert and oriented ?Psych: Pleasant. Cooperative.  ? ? ? LOS: 6 days  ? ?Tye Savoy ,NP 05/25/2021, 10:25 AM ? ? ? ?_______________________________________________________________________________________________________________________________________ ? ?[facility] MD note: ? ?I personally examined the patient, reviewed the data and agree with the assessment and plan described above.  I provided a substantive portion of the care of this patient (personally provided more than half of the total time dedicated to the treatment of this patient).  Complicated situation, possibly more than one malignancy, she will need medical and surgical oncology input after all staging tests  are complete.   ? ?Please call or page with any further questions or concerns. ? ?Owens Loffler, MD ?[facility] ?Pager [phone] ? ? ? ?

## 2021-05-26 ENCOUNTER — Inpatient Hospital Stay (HOSPITAL_COMMUNITY): Payer: HMO | Admitting: Certified Registered Nurse Anesthetist

## 2021-05-26 ENCOUNTER — Encounter (HOSPITAL_COMMUNITY): Admission: EM | Disposition: E | Payer: Self-pay | Source: Home / Self Care | Attending: Family Medicine

## 2021-05-26 ENCOUNTER — Encounter (HOSPITAL_COMMUNITY): Payer: Self-pay | Admitting: Family Medicine

## 2021-05-26 ENCOUNTER — Inpatient Hospital Stay (HOSPITAL_COMMUNITY): Payer: HMO

## 2021-05-26 DIAGNOSIS — I509 Heart failure, unspecified: Secondary | ICD-10-CM

## 2021-05-26 DIAGNOSIS — M549 Dorsalgia, unspecified: Secondary | ICD-10-CM

## 2021-05-26 DIAGNOSIS — R0602 Shortness of breath: Secondary | ICD-10-CM

## 2021-05-26 DIAGNOSIS — C7951 Secondary malignant neoplasm of bone: Secondary | ICD-10-CM

## 2021-05-26 DIAGNOSIS — Z7901 Long term (current) use of anticoagulants: Secondary | ICD-10-CM

## 2021-05-26 DIAGNOSIS — I482 Chronic atrial fibrillation, unspecified: Secondary | ICD-10-CM

## 2021-05-26 DIAGNOSIS — I110 Hypertensive heart disease with heart failure: Secondary | ICD-10-CM

## 2021-05-26 DIAGNOSIS — J90 Pleural effusion, not elsewhere classified: Secondary | ICD-10-CM

## 2021-05-26 DIAGNOSIS — R918 Other nonspecific abnormal finding of lung field: Secondary | ICD-10-CM | POA: Diagnosis not present

## 2021-05-26 DIAGNOSIS — I2510 Atherosclerotic heart disease of native coronary artery without angina pectoris: Secondary | ICD-10-CM

## 2021-05-26 DIAGNOSIS — D631 Anemia in chronic kidney disease: Secondary | ICD-10-CM

## 2021-05-26 DIAGNOSIS — E860 Dehydration: Secondary | ICD-10-CM

## 2021-05-26 DIAGNOSIS — J470 Bronchiectasis with acute lower respiratory infection: Secondary | ICD-10-CM | POA: Diagnosis not present

## 2021-05-26 DIAGNOSIS — R599 Enlarged lymph nodes, unspecified: Secondary | ICD-10-CM

## 2021-05-26 DIAGNOSIS — R590 Localized enlarged lymph nodes: Secondary | ICD-10-CM | POA: Diagnosis not present

## 2021-05-26 DIAGNOSIS — N189 Chronic kidney disease, unspecified: Secondary | ICD-10-CM

## 2021-05-26 DIAGNOSIS — J189 Pneumonia, unspecified organism: Secondary | ICD-10-CM | POA: Diagnosis not present

## 2021-05-26 DIAGNOSIS — K921 Melena: Secondary | ICD-10-CM

## 2021-05-26 DIAGNOSIS — C189 Malignant neoplasm of colon, unspecified: Secondary | ICD-10-CM | POA: Diagnosis not present

## 2021-05-26 DIAGNOSIS — R531 Weakness: Secondary | ICD-10-CM

## 2021-05-26 DIAGNOSIS — D638 Anemia in other chronic diseases classified elsewhere: Secondary | ICD-10-CM | POA: Diagnosis not present

## 2021-05-26 DIAGNOSIS — Z9981 Dependence on supplemental oxygen: Secondary | ICD-10-CM

## 2021-05-26 HISTORY — PX: ENDOBRONCHIAL ULTRASOUND: SHX5096

## 2021-05-26 HISTORY — PX: THORACENTESIS: SHX235

## 2021-05-26 HISTORY — PX: FINE NEEDLE ASPIRATION: SHX6590

## 2021-05-26 LAB — BASIC METABOLIC PANEL
Anion gap: 8 (ref 5–15)
BUN: 26 mg/dL — ABNORMAL HIGH (ref 8–23)
CO2: 28 mmol/L (ref 22–32)
Calcium: 9.3 mg/dL (ref 8.9–10.3)
Chloride: 99 mmol/L (ref 98–111)
Creatinine, Ser: 0.57 mg/dL (ref 0.44–1.00)
GFR, Estimated: >60 mL/min (ref >60)
Glucose, Bld: 109 mg/dL — ABNORMAL HIGH (ref 70–99)
Potassium: 3.8 mmol/L (ref 3.5–5.1)
Sodium: 135 mmol/L (ref 135–145)

## 2021-05-26 LAB — PROTEIN, PLEURAL OR PERITONEAL FLUID: Total protein, fluid: <3 g/dL

## 2021-05-26 LAB — CBC
HCT: 25.9 % — ABNORMAL LOW (ref 36.0–46.0)
Hemoglobin: 8.4 g/dL — ABNORMAL LOW (ref 12.0–15.0)
MCH: 29.7 pg (ref 26.0–34.0)
MCHC: 32.4 g/dL (ref 30.0–36.0)
MCV: 91.5 fL (ref 80.0–100.0)
Platelets: 122 10*3/uL — ABNORMAL LOW (ref 150–400)
RBC: 2.83 MIL/uL — ABNORMAL LOW (ref 3.87–5.11)
RDW: 20.3 % — ABNORMAL HIGH (ref 11.5–15.5)
WBC: 15.5 10*3/uL — ABNORMAL HIGH (ref 4.0–10.5)
nRBC: 0.8 % — ABNORMAL HIGH (ref 0.0–0.2)

## 2021-05-26 LAB — BLOOD GAS, VENOUS
Acid-Base Excess: 3.3 mmol/L — ABNORMAL HIGH (ref 0.0–2.0)
Bicarbonate: 29.1 mmol/L — ABNORMAL HIGH (ref 20.0–28.0)
O2 Saturation: 55.2 %
Patient temperature: 37
pCO2, Ven: 48 mmHg (ref 44–60)
pH, Ven: 7.39 (ref 7.25–7.43)
pO2, Ven: 31 mmHg — CL (ref 32–45)

## 2021-05-26 LAB — BODY FLUID CELL COUNT WITH DIFFERENTIAL
Lymphs, Fluid: 13 %
Monocyte-Macrophage-Serous Fluid: 26 % — ABNORMAL LOW (ref 50–90)
Neutrophil Count, Fluid: 61 % — ABNORMAL HIGH (ref 0–25)
Total Nucleated Cell Count, Fluid: 105 cu mm (ref 0–1000)

## 2021-05-26 LAB — CEA: CEA: 349 ng/mL — ABNORMAL HIGH (ref 0.0–4.7)

## 2021-05-26 LAB — LACTATE DEHYDROGENASE: LDH: 681 U/L — ABNORMAL HIGH (ref 98–192)

## 2021-05-26 LAB — PROTEIN, TOTAL: Total Protein: 6.5 g/dL (ref 6.5–8.1)

## 2021-05-26 SURGERY — ENDOBRONCHIAL ULTRASOUND (EBUS)
Anesthesia: General | Laterality: Bilateral

## 2021-05-26 MED ORDER — PROPOFOL 10 MG/ML IV BOLUS
INTRAVENOUS | Status: DC | PRN
  Administered 2021-05-26: 60 mg via INTRAVENOUS

## 2021-05-26 MED ORDER — ROCURONIUM BROMIDE 10 MG/ML (PF) SYRINGE
PREFILLED_SYRINGE | INTRAVENOUS | Status: DC | PRN
  Administered 2021-05-26: 20 mg via INTRAVENOUS

## 2021-05-26 MED ORDER — PROPOFOL 10 MG/ML IV BOLUS
INTRAVENOUS | Status: AC
  Filled 2021-05-26: qty 20

## 2021-05-26 MED ORDER — SUGAMMADEX SODIUM 200 MG/2ML IV SOLN
INTRAVENOUS | Status: DC | PRN
Start: 2021-05-26 — End: 2021-05-26
  Administered 2021-05-26: 100 mg via INTRAVENOUS

## 2021-05-26 MED ORDER — PHENYLEPHRINE 40 MCG/ML (10ML) SYRINGE FOR IV PUSH (FOR BLOOD PRESSURE SUPPORT)
PREFILLED_SYRINGE | INTRAVENOUS | Status: DC | PRN
  Administered 2021-05-26 (×2): 120 ug via INTRAVENOUS
  Administered 2021-05-26: 200 ug via INTRAVENOUS
  Administered 2021-05-26 (×2): 80 ug via INTRAVENOUS

## 2021-05-26 MED ORDER — FENTANYL CITRATE (PF) 250 MCG/5ML IJ SOLN
INTRAMUSCULAR | Status: DC | PRN
  Administered 2021-05-26 (×2): 50 ug via INTRAVENOUS

## 2021-05-26 MED ORDER — LIDOCAINE 2% (20 MG/ML) 5 ML SYRINGE
INTRAMUSCULAR | Status: DC | PRN
  Administered 2021-05-26: 80 mg via INTRAVENOUS

## 2021-05-26 MED ORDER — LACTATED RINGERS IV SOLN: INTRAVENOUS | Status: DC | PRN

## 2021-05-26 MED ORDER — DEXAMETHASONE SODIUM PHOSPHATE 10 MG/ML IJ SOLN
INTRAMUSCULAR | Status: DC | PRN
Start: 2021-05-26 — End: 2021-05-26
  Administered 2021-05-26: 8 mg via INTRAVENOUS

## 2021-05-26 MED ORDER — FENTANYL CITRATE (PF) 100 MCG/2ML IJ SOLN
INTRAMUSCULAR | Status: AC
  Filled 2021-05-26: qty 2

## 2021-05-26 MED ORDER — EPHEDRINE SULFATE-NACL 50-0.9 MG/10ML-% IV SOSY
PREFILLED_SYRINGE | INTRAVENOUS | Status: DC | PRN
Start: 2021-05-26 — End: 2021-05-26
  Administered 2021-05-26: 10 mg via INTRAVENOUS
  Administered 2021-05-26: 15 mg via INTRAVENOUS
  Administered 2021-05-26: 10 mg via INTRAVENOUS

## 2021-05-26 NOTE — Progress Notes (Signed)
?  Progress Note ? ? ?Patient: [name] GQB:[id number] DOB: [birth year] DOA: 05/24/2021     7 ?DOS: the patient was seen and examined on 06/14/2021 ?  ? ? ? ? ?Brief hospital course: ?[name] is a 76 y.o. F with permAF on Eliquis, COPD/bronchiectasis/MAI and chronic respiratory failure on 4L at home, CAD, hx PPM, chronic pain and dCHF who presented with weakness and back pain.  Also found incidentally to report melena and have FOBT in the ER.   ? ? ?3/2: Admitted, started on PPI given melena, transfused 2 units; given antibiotics given multifocal PNA on CXR ?3/3: GI consulted for melena; CT chest showed RUL mass, loculated effusion and lytic lesions scattered in bone --> Pulmonology consulted ?3/4: EGD significant for gastritis only  ?3/7: Colonoscopy showed a subtle mucosal irregularity at her anastomosis, biopsied, but no definite source of bleeding ?3/8: Biopsy from colonoscopy resulted adenoCA ?3/9: Gen Surg, Palliative Care and Oncology consulted ?3/10: Bronchoscopic biopsy of mediastinal lymph node by Pulmonology ? ? ? ? ? ?Assessment and Plan: ?* Possible right upper lobe pneumonia ?See prior summary ?-Continue Levaquin  ?  ? ? ? ?Symptomatic anemia ?See prior summary, Hgb slow trend down ?- Trend Hgb ?-Continue PPI ? ? ?Mass of upper lobe of right lung with suspected lytic lesions of bone ?- Consult Oncology, appreciate expertise ?- Consult pulmonology, biopsy planned today ? ?  ?Paroxysmal atrial fibrillation (HCC) ? -Hold Eliquis for now ? ? ? ? ?Chronic diastolic CHF (congestive heart failure) ([place]) ?- Continue Lasix ?-Hold amlodipine ?   ?COPD with emphysema ([place]) ?Chronic hypoxemic respiratory failure (HCC) ?- Continue home oxygen at 4 L/min ?- Continue Incruse Ellipta and albuterol ? ? ? ? ? ?  ? ?Subjective: Patient extremely weak, has not been out of bed and days, no melena, no fever, no respiratory distress. ? ?Physical Exam: ?Vitals:  ? 06/16/2021 1606 06/06/2021 1615 05/17/2021 1625 05/25/2021 1707   ?BP: (!) 169/108 (!) 161/95 (!) 156/66 119/78  ?Pulse: (!) 125 (!) 123 (!) 122 (!) 123  ?Resp:  18 12 16   ?Temp: 98.2 ?F (36.8 ?C)   (!) 97.5 ?F (36.4 ?C)  ?TempSrc:    Oral  ?SpO2: 100% 100% 94% 92%  ?Weight:      ?Height:      ? ?Frail elderly female, lying in bed, appears debilitated ?Normal respiratory rate and rhythm, lung sounds diminished throughout, no increased respiratory effort ?RRR, no murmurs, no peripheral edema ?Overall severe loss of subcutaneous muscle mass and fat ?Abdomen soft tenderness palpation or guarding ?Attention seems normal, affect blunted, appears fatigued, generally weak in all 4 extremities, face symmetric and speech fluent ? ? ? ? ?Data Reviewed: ?Discussed with oncology, pulmonology.  Vital signs reviewed, nursing notes reviewed, general surgery, GI notes reviewed. ?Labs and imaging reports reviewed. ?Greater than 50 minutes was spent with the patient and on the floor, coordinating care and managing medications. ? ?Family Communication: Husbabnd at the bedside, daughter by phone ? ?Disposition: ?Status is: Inpatient ?Remains inpatient appropriate because: Patient requires significant rehabilitation to return to a point where she could tolerate chemotherapy.  Also requires ongoing goals of care for appropriate disposition ? ? ? Planned Discharge Destination: Skilled nursing facility ? ? ? ? ?Author: ?Edwin Dada, MD ?05/27/2021 6:17 PM ? ?For on call review [email or web address].  ?

## 2021-05-26 NOTE — Op Note (Signed)
Thoracentesis  Procedure Note ? ?[name]  ?[id number]  ?Mar 06, 1946 ? ?Date:05/24/2021  ?Time:4:06 PM  ? ?Provider Performing:Ahmir Bracken L Dejean Tribby  ? ?Procedure: Thoracentesis with imaging guidance (96222) ? ?Indication(s) ?Pleural Effusion ? ?Consent ?Risks of the procedure as well as the alternatives and risks of each were explained to the patient and/or caregiver.  Consent for the procedure was obtained and is signed in the bedside chart ? ?Anesthesia ?Topical only with 1% lidocaine  ? ? ?Time Out ?Verified patient identification, verified procedure, site/side was marked, verified correct patient position, special equipment/implants available, medications/allergies/relevant history reviewed, required imaging and test results available. ? ? ?Sterile Technique ?Maximal sterile technique including full sterile barrier drape, hand hygiene, sterile gown, sterile gloves, mask, hair covering, sterile ultrasound probe cover (if used). ? ?Procedure Description ?Ultrasound was used to identify appropriate pleural anatomy for placement and overlying skin marked.  Area of drainage cleaned and draped in sterile fashion. Lidocaine was used to anesthetize the skin and subcutaneous tissue.  300 cc's of clear yellow fluid appearing fluid was drained from the right pleural space. Catheter then removed and bandaid applied to site. ? ? ?Complications/Tolerance ?None; patient tolerated the procedure well. ?Chest X-ray is ordered to confirm no post-procedural complication. ? ? ?EBL ?Minimal ? ? ?Specimen(s) ?Pleural fluid ? ? ?[name], DO ?[facility] ?06/13/2021 4:07 PM   ? ? ? ? ? ? ? ?

## 2021-05-26 NOTE — Transfer of Care (Signed)
Immediate Anesthesia Transfer of Care Note ? ?Patient: [name] ? ?Procedure(s) Performed: ENDOBRONCHIAL ULTRASOUND (Bilateral) ?THORACENTESIS ?FINE NEEDLE ASPIRATION ? ?Patient Location: PACU ? ?Anesthesia Type:General ? ?Level of Consciousness: awake, alert  and oriented ? ?Airway & Oxygen Therapy: Patient Spontanous Breathing and Patient connected to face mask oxygen ? ?Post-op Assessment: Report given to RN and Post -op Vital signs reviewed and stable ? ?Post vital signs: Reviewed and stable ? ?Last Vitals:  ?Vitals Value Taken Time  ?BP 169/108 05/28/2021 1606  ?Temp 36.8 ?C 06/07/2021 1606  ?Pulse 124 06/10/2021 1608  ?Resp 14 06/12/2021 1608  ?SpO2 100 % 05/30/2021 1608  ?Vitals shown include unvalidated device data. ? ?Last Pain:  ?Vitals:  ? 06/10/2021 1406  ?TempSrc: Temporal  ?PainSc: 0-No pain  ?   ? ?Patients Stated Pain Goal: 0 (05/25/21 1632) ? ?Complications: No notable events documented. ?

## 2021-05-26 NOTE — Consult Note (Signed)
Consultation Note Date: 06/04/2021   Patient Name: [name]  DOB: February 01, 1946  MRN: [id number]  Age / Sex: 76 y.o., female  PCP: Ann Held, DO Referring Physician: Edwin Dada, *  Reason for Consultation: Establishing goals of care  HPI/Patient Profile: 76 y.o. female  with past medical history of COPD/emphysema/MAI on 4 L O2 at home, new pulmonary mass admitted, anemia on erythropoietin followed by Dr. Marin Olp, A-fib on Eliquis, multiple prior abdominal surgeries with significant colon resections, hypertension, CAD, GERD, CHF, fibromyalgia and pacemaker on 05/17/2021 with increased weakness and dark stool.  She had EGD that was revealing for gastritis and then had colonoscopy that revealed adenocarcinoma with signet ring cell features at the anastomosis of prior surgery.  Palliative consulted for goals of care.  Clinical Assessment and Goals of Care: Palliative care consult received.  Chart reviewed including personal review of pertinent labs and imaging.  I saw and examined [name] today.  She tells me that the most important things to her are her family and her faith.  She lives with her husband and they have 6 children.  She worked in the past as an Optometrist.  She is of [employer] and this is of utmost importance to her.  She reports that doctors have been doing a good job explaining things to her, however, she feels she needs more information about everything going on to make decisions about her care moving forward.  We reviewed the fact that she has new lung mass and that IR has been consulted to assess for area of biopsy.  She follows with Dr. Marin Olp as an outpatient for anemia and has a great deal of trust in him.  She has not yet spoken with him about findings of adenocarcinoma a colectomy as anastomosis or consolidation of right upper lobe concerning for masslike  lesion.  At this point, she would like to proceed with biopsy of lung mass, have Dr. Marin Olp weigh in, and then have further conversations regarding options for care long-term goals when she has more information to make decisions.  SUMMARY OF RECOMMENDATIONS   -Full code/full scope -Await input regarding best options for biopsy for lung mass.  She would like to proceed with biopsy and then have further discussion in conjunction with Dr. Marin Olp regarding options for care once we have a better idea exactly what is going on. -Palliative will continue to follow along.  Code Status/Advance Care Planning: Full code     Primary Diagnoses: Present on Admission:  Symptomatic anemia  Paroxysmal atrial fibrillation (HCC)  Anemia, chronic disease  Chronic hypoxemic respiratory failure (HCC)  COPD with emphysema (HCC)  Essential hypertension  Chronic pain syndrome  Chronic diastolic CHF (congestive heart failure) (HCC)  Unstageable pressure ulcer of sacral region [facility])  UTI (urinary tract infection)  (Resolved) Leukocytosis  Protein-calorie malnutrition, moderate ([place])   I have reviewed the medical record, interviewed the patient and family, and examined the patient. The following aspects are pertinent.  Past Medical History:  Diagnosis  Date   Anemia    Arthritis    Atrial fibrillation ([place])    CAD (coronary artery disease)    Mild disease per cath in 2011   COPD (chronic obstructive pulmonary disease) ([place])    Home O2   Diverticulitis    Elbow fracture, left aug 2012   Erythropoietin deficiency anemia 06/19/2018   Essential hypertension    Fibromyalgia    Gastroparesis    GERD (gastroesophageal reflux disease)    H/O: GI bleed    from Pradaxa   Hepatitis    Hx of in high school    History of pneumonia    Recurrent   Hx MRSA infection    Hx of gastric ulcer    Hx of stress fx 10/2009   Right hip    Iron malabsorption 08/14/2017   LBBB (left bundle branch block)    Oxygen  dependent    2 liters via nasal cannula at all times   Pacemaker    CRT therapy; followed by Dr. Caryl Comes   Pleural effusion     s/p right thoracentesis 03/09   Primary dilated cardiomyopathy (HCC)    EF 45 to 50% per echo in Jan 2012   RLS (restless legs syndrome)    Silent aspiration    Small bowel obstruction (HCC)    Urinary incontinence    Venous embolism and thrombosis of subclavian vein (HCC)    After pacemaker insertion Oct 2011   Vitamin B12 deficiency    Wears glasses    Reading   Social History   Socioeconomic History   Marital status: Married    Spouse name: Not on file   Number of children: 5   Years of education: Not on file   Highest education level: Not on file  Occupational History   Occupation: disabled    Employer: RETIRED  Tobacco Use   Smoking status: Former    Packs/day: 1.00    Years: 20.00    Pack years: 20.00    Types: Cigarettes    Quit date: 03/19/1986    Years since quitting: 35.2   Smokeless tobacco: Never  Vaping Use   Vaping Use: Never used  Substance and Sexual Activity   Alcohol use: No    Alcohol/week: 0.0 standard drinks   Drug use: No   Sexual activity: Yes  Other Topics Concern   Not on file  Social History Narrative   Occupation: Disabled   Married - second time - biologic daughter (in [place]) from first marriage - adopted 4 3 alive with second marriage   Alcohol Use - no   Illicit Drug Use - no   Patient is a former smoker, quit 1988 x64yrs, <1ppd.    Daily Caffeine Use   01/20/2015 - updated   Social Determinants of Health   Financial Resource Strain: Medium Risk   Difficulty of Paying Living Expenses: Somewhat hard  Food Insecurity: No Food Insecurity   Worried About Charity fundraiser in the Last Year: Never true   Ran Out of Food in the Last Year: Never true  Transportation Needs: No Transportation Needs   Lack of Transportation (Medical): No   Lack of Transportation (Non-Medical): No  Physical Activity:  Insufficiently Active   Days of Exercise per Week: 6 days   Minutes of Exercise per Session: 20 min  Stress: No Stress Concern Present   Feeling of Stress : Not at all  Social Connections: Moderately Integrated   Frequency of Communication with Friends  and Family: More than three times a week   Frequency of Social Gatherings with Friends and Family: More than three times a week   Attends Religious Services: More than 4 times per year   Active Member of [employer] or Organizations: No   Attends Music therapist: Never   Marital Status: Married   Family History  Problem Relation Age of Onset   Heart disease Father    Stroke Mother    Heart disease Mother    Heart disease Brother    Breast cancer Maternal Aunt    Heart disease Paternal Aunt    Diabetes Paternal Grandfather    Colon cancer Neg Hx    Scheduled Meds:  vitamin C  250 mg Oral BID   atorvastatin  20 mg Oral QHS   Chlorhexidine Gluconate Cloth  6 each Topical Daily   collagenase   Topical Daily   feeding supplement  1 Container Oral BID BM   furosemide  40 mg Oral Daily   gabapentin  300 mg Oral BID   And   gabapentin  600 mg Oral QHS   levofloxacin  500 mg Oral Daily   multivitamin with minerals  1 tablet Oral Daily   nutrition supplement (JUVEN)  1 packet Oral BID BM   nystatin   Topical TID   pantoprazole  40 mg Oral Daily   sodium chloride flush  10-40 mL Intracatheter Q12H   umeclidinium bromide  1 puff Inhalation Daily   zinc sulfate  220 mg Oral Daily   Continuous Infusions: PRN Meds:.oxyCODONE **AND** acetaminophen, albuterol, hydrALAZINE, morphine injection, ondansetron **OR** ondansetron (ZOFRAN) IV, prochlorperazine, sodium chloride flush, zolpidem Medications Prior to Admission:  Prior to Admission medications   Medication Sig Start Date End Date Taking? Authorizing Provider  acetaminophen (TYLENOL) 650 MG CR tablet Take 650 mg by mouth every 8 (eight) hours as needed for pain.   Yes  [provider]  albuterol (VENTOLIN HFA) 108 (90 Base) MCG/ACT inhaler Inhale 2 puffs into the lungs every 6 (six) hours as needed for wheezing or shortness of breath.   Yes [provider]  amLODipine (NORVASC) 2.5 MG tablet TAKE 1 TABLET BY MOUTH AT BEDTIME Patient taking differently: Take 2.5 mg by mouth at bedtime. 03/29/21  Yes Minus Breeding, MD  apixaban (ELIQUIS) 5 MG TABS tablet Take 1 tablet (5 mg total) by mouth 2 (two) times daily. 09/08/20  Yes Roma Schanz R, DO  atorvastatin (LIPITOR) 20 MG tablet TAKE 1 TABLET BY MOUTH ONCE DAILY AT  6PM Patient taking differently: Take 20 mg by mouth every evening. 11/23/20  Yes Roma Schanz R, DO  gabapentin (NEURONTIN) 600 MG tablet TAKE 1 TABLET BY MOUTH ONCE DAILY IN THE MORNING, AND  AT  NOON,  AND  IN  THE  EVENING,  AND  AT  BEDTIME Patient taking differently: Take 600 mg by mouth See admin instructions. TAKE 600mg   BY MOUTH ONCE DAILY IN THE MORNING, AND AT NOON, AND IN THE EVENING, AND AT BEDTIME 04/24/21  Yes Lowne Chase, Yvonne R, DO  levofloxacin (LEVAQUIN) 500 MG tablet Take 1 tablet (500 mg total) by mouth daily. 04/28/21  Yes Martyn Ehrich, NP  oxyCODONE-acetaminophen (PERCOCET) 10-325 MG tablet Take 1 tablet by mouth every 4 (four) hours as needed. PAIN 05/17/21  Yes Roma Schanz R, DO  OXYGEN Inhale 4 L/min into the lungs continuous.   Yes [provider]  pantoprazole (PROTONIX) 40 MG tablet Take 1  tablet (40 mg total) by mouth 2 (two) times daily. Patient taking differently: Take 40 mg by mouth daily. 12/20/20  Yes Gatha Mayer, MD  promethazine (PHENERGAN) 25 MG tablet Take 1 tablet (25 mg total) by mouth every 6 (six) hours as needed for nausea or vomiting. Patient taking differently: Take 12.5-25 mg by mouth every 6 (six) hours as needed for nausea or vomiting. 12/20/20  Yes Gatha Mayer, MD  tiZANidine (ZANAFLEX) 4 MG tablet Take 1 tablet (4 mg total) by mouth 3 (three) times  daily as needed. for muscle spams 05/16/21  Yes Roma Schanz R, DO  triamcinolone ointment (KENALOG) 0.1 % Apply 1 application topically daily as needed (to affected areas- for psoriasis).  03/19/18  Yes [provider]  umeclidinium bromide (INCRUSE ELLIPTA) 62.5 MCG/ACT AEPB Inhale 1 puff into the lungs daily. Patient taking differently: Inhale 1 puff into the lungs daily as needed (for shortness of breath). 03/03/21  Yes Roma Schanz R, DO  zolpidem (AMBIEN) 5 MG tablet TAKE 1 TABLET BY MOUTH EVERY DAY AT BEDTIME AS NEEDED Patient taking differently: Take 5 mg by mouth at bedtime as needed for sleep. 04/18/21  Yes Roma Schanz R, DO  furosemide (LASIX) 40 MG tablet TAKE 1 TABLET BY MOUTH ONCE DAILY IN THE MORNING Patient not taking: Reported on 05/17/2021 02/14/21   Carollee Herter, Alferd Apa, DO  nitroGLYCERIN (NITROSTAT) 0.4 MG SL tablet Place 1 tablet (0.4 mg total) under the tongue every 5 (five) minutes as needed. CHEST PAIN Patient not taking: Reported on 05/22/2021 06/01/19   Minus Breeding, MD  iron dextran complex (INFED) 50 MG/ML injection Please give infed infusion (no test dose needed) over 4 hours per pharmacy calculated dose. Ht: 5'2 Wt: 99 pounds Hgb: 12 12/20/10 12/16/18  Lafayette Dragon, MD   Allergies  Allergen Reactions   Dabigatran Etexilate Mesylate Other (See Comments)    INTERNAL BLEEDING- Pradaxa   Augmentin [Amoxicillin-Pot Clavulanate] Diarrhea and Nausea Only    Did it involve swelling of the face/tongue/throat, SOB, or low BP? No Did it involve sudden or severe rash/hives, skin peeling, or any reaction on the inside of your mouth or nose? No Did you need to seek medical attention at a hospital or doctor's office? No When did it last happen? 02/2019 If all above answers are "NO", may proceed with cephalosporin use.    Talwin [Pentazocine] Other (See Comments)    Hallucinations    Review of Systems  Constitutional:  Positive for activity  change and fatigue.  Respiratory:  Positive for shortness of breath.   Gastrointestinal:  Positive for blood in stool.  Musculoskeletal:  Positive for arthralgias, back pain and myalgias.  Neurological:  Positive for weakness.  Psychiatric/Behavioral:  Positive for sleep disturbance.    Physical Exam General: Alert, awake, in no acute distress.  Pleasant and cooperative to exam.  Frail. Heart: Regular rate and rhythm. No murmur appreciated. Lungs: Diminished air movement, clear Abdomen: Soft, nontender, nondistended, positive bowel sounds.   Ext: No significant edema.  Muscle wasting Skin: Warm and dry   Vital Signs: BP (!) 130/91 (BP Location: Left Arm)    Pulse (!) 114    Temp (!) 97.5 F (36.4 C) (Oral)    Resp 16    Ht 5' (1.524 m)    Wt 41.9 kg    SpO2 96%    BMI 18.04 kg/m  Pain Scale: 0-10 POSS *See Group Information*: S-Acceptable,Sleep, easy to arouse Pain Score:  Asleep   SpO2: SpO2: 96 % O2 Device:SpO2: 96 % O2 Flow Rate: .O2 Flow Rate (L/min): 4 L/min  IO: Intake/output summary:  Intake/Output Summary (Last 24 hours) at 06/14/2021 7915 Last data filed at 05/21/2021 0700 Gross per 24 hour  Intake 410 ml  Output 700 ml  Net -290 ml    LBM: Last BM Date : 05/25/21 Baseline Weight: Weight: 38.6 kg Most recent weight: Weight: 41.9 kg     Palliative Assessment/Data:   Flowsheet Rows    Flowsheet Row Most Recent Value  Intake Tab   Referral Department Hospitalist  Unit at Time of Referral Oncology Unit  Palliative Care Primary Diagnosis Cancer  Clinical Assessment   Psychosocial & Spiritual Assessment   Palliative Care Outcomes       Time Total: 60 minutes Greater than 50%  of this time was spent counseling and coordinating care related to the above assessment and plan.  Signed by: Micheline Rough, MD   Please contact Palliative Medicine Team phone at [phone] for questions and concerns.  For individual provider: See Shea Evans

## 2021-05-26 NOTE — H&P (View-Only) (Signed)
NAME:  [name], MRN:  [id number], DOB:  1945-12-12, LOS: 7 ADMISSION DATE:  06/14/2021, CONSULTATION DATE:  05/19/21 REFERRING MD:  [facility], CHIEF COMPLAINT:  MAI/ bronchiectasis  with new effusion, lung nodule   BRIEF  75 yowf quit smoking 47 y PTA with melena and followed by [facility] for MAI with last rx June 2022 tol poorly and since then general state of decline, uses w/c to go anyhere outside the house with rattling cough productive of white mucus and doe x more than room to room though also limited by weakness.   Denies dysphagia or cp fever chills or NS   Pertinent  Medical History  Anemia  Arthritis, Atrial fibrillation  CAD  COPD\  Diverticulitis  Erythropoietin deficiency anemia   Essential hypertension Fibromyalgia Gastroparesis GERD Hepatitis History of pneumonia MRSA infection,  gastric ulcer Iron malabsorption   LBBB (left bundle branch block)  Oxygen dependent Pacemaker Pleural effusion,  Primary dilated cardiomyopathy  Silent aspiration Small bowel obstruction Urinary incontinence Venous embolism and thrombosis of subclavian vein (  Vitamin B12 deficiency   EVENTS  3/2: Admitted, started on PPI given melena, transfused 2 units; given antibiotics given multifocal PNA on CXR 05/19/21 - Lying quietly in bed on 4lpm NP nad cc dry mouth as npo for endoscopy 3/3: GI consulted for melena; CT chest showed RUL mass, loculated effusion and lytic lesions scattered in bone --> Pulmonology consulted 3/4: EGD significant for gastritis only  3/7: Colonoscopy showed a subtle mucosal irregularity at her anastomosis, biopsied, but no definite source of bleeding 3/8: Biopsy from colonoscopy resulted adenoCA 3/9: Gen Surg, Palliative Care and Oncology consulted  - [facility] consideirng IR consult for RUL mass bx due to RUL mass with Lytic bone lesions    Interim History / Subjective:    3/.10/23 - [facility] recalling PCCM. [name] Has pulmonary findings that needs  ddifferentiating of infection v mets to lung. There is IR consult pending for the lytic bone lesion  She has failure to thrive and is ECOG 04. Is on 4L [place] oper [facility]. PEr family husband and patoient -> on o2 x 2 years at 4L . Not driven in 6 weeks. ECOG 3-4 x 4 weeks and bed bouned 2 weeks prior to admit and failure to thrive and weight loss and deconditioned  Objective   Blood pressure (!) 130/91, pulse (!) 114, temperature (!) 97.5 F (36.4 C), temperature source Oral, resp. rate 16, height 5' (1.524 m), weight 41.9 kg, SpO2 96 %.    FiO2 (%):  [36 %] 36 %   Intake/Output Summary (Last 24 hours) at 05/24/2021 0845 Last data filed at 05/29/2021 0700 Gross per 24 hour  Intake 410 ml  Output 700 ml  Net -290 ml    Estimated body mass index is 18.04 kg/m as calculated from the following:   Height as of this encounter: 5' (1.524 m).   Weight as of this encounter: 41.9 kg.  Filed Weights   05/22/21 0439 05/31/2021 0635 05/24/21 0540  Weight: 41.6 kg 40 kg 41.9 kg    Examination: General Appearance:  Looks deconditioned. Frail. On o2. Lying in bed Head:  Normocephalic, without obvious abnormality, atraumatic Eyes:  PERRL - yes, conjunctiva/corneas - muddy     Ears:  Normal external ear canals, both ears Nose:  G tube - no. 4LNC o2+ Throat:  ETT TUBE - no , OG tube - no.  Neck:  Supple,  No enlargement/tenderness/nodules Lungs: Clear to auscultation bilaterally Heart:  S1  and S2 normal, no murmur, CVP - no.  Pressors - no Abdomen:  Soft, no masses, no organomegaly Genitalia / Rectal:  Not done Extremities:  Extremities- intact Skin:  ntact in exposed areas . Sacral area - per patient has sacral decub and confirme das unstaagle eschar per RN Neurologic:  Sedation - none -> RASS - +1 . Moves all 4s - yes. CAM-ICU - neg . Orientation - x3+     Ct chest with contrast 05/28/2021  - personally visualized.  Interval development since April 2022 of masslike consolidation within the a  right upper lobe in an area of more focal consolidation noted on prior examination suspicious for primary bronchogenic malignancy. Associated shotty right pre-vascular, and periaortic adenopathy as well as necrotic right hilar adenopathy. Interval development of numerous lytic lesions throughout the visualized axial skeleton. PET CT examination may be helpful for further evaluation.   Superimposed changes of a chronic atypical infection or infectious bronchiolitis with bronchiolectasis, airway impaction, and peripheral subpleural consolidation reticulation grossly stable since prior examination.   Interval development of partially loculated right pleural effusion. Diagnostic thoracentesis may be helpful for further management.   Extensive multi-vessel coronary artery calcification. Morphologic changes in keeping with pulmonary arterial hypertension.   Aortic Atherosclerosis (ICD10-I70.0).       Assessment & Plan:  #Background prior to admit and present on admit  - Failure to Thrive with unintentioal weight loss - Cachexia - BMI 18  - Severe protein calorie malnurition - alb 2.9 at admit  - Chronic respirartory fialrue - 4L [place]  - Bed bound eCOG 4 - Hx of sacral deub prior to admit - eschar unstageable per RN - Hx of smokng   #Present on admit  - Colon Cancer  - admission problemn - Lytic bone mets - ? Due to above  - At admission also   - small bilateral pleural effusions  - Hilar node - necrotic  - RUL consolidation with mass and loculated subpleural fluid cllection anteriorky  Plan  - inpatient PET Scan requested  - Regarding pleural effusioss - > too small to tap - Regarding Necrotic mediastinal;/hilar node -> can do EBUS but a) can be non-diagnostic due to necrosis; b) what is the value -> per Oncology  =diagnosis and molecular markers need with view towards immunotherapoy; c) what is the risk -> modeerate but will get ABG - Regarding RUL mass -> this is too necrotic  to give yield with CT guided TTNA. Bronch can help identify any endobronchial lesion  - recommend goals of care and code status   Dr Chase Caller will reach out to Dr Valeta Harms regarding procedures   Best Practice (right click and "Reselect all SmartList Selections" daily)  Per [facility]  D./w  [facility] MD Dr Loleta Books, patient and husband   LABS    PULMONARY No results for input(s): PHART, PCO2ART, PO2ART, HCO3, TCO2, O2SAT in the last 168 hours.  Invalid input(s): PCO2, PO2  CBC Recent Labs  Lab 05/24/21 1043 05/25/21 0359 06/10/2021 0539  HGB 7.0* 8.8* 8.4*  HCT 22.5* 27.5* 25.9*  WBC 16.6* 17.2* 15.5*  PLT 137* 132* 122*    COAGULATION No results for input(s): INR in the last 168 hours.  CARDIAC  No results for input(s): TROPONINI in the last 168 hours. No results for input(s): PROBNP in the last 168 hours.   CHEMISTRY Recent Labs  Lab 05/21/21 1054 05/22/21 0515 05/25/21 0359 05/31/2021 0539  NA 132* 132* 136 135  K 3.7 3.8 3.8 3.8  CL 93* 94* 103 99  CO2 30 31 27 28   GLUCOSE 155* 106* 110* 109*  BUN 28* 29* 27* 26*  CREATININE 0.50 0.47 0.54 0.57  CALCIUM 9.2 8.8* 8.9 9.3   Estimated Creatinine Clearance: 39.6 mL/min (by C-G formula based on SCr of 0.57 mg/dL).   LIVER No results for input(s): AST, ALT, ALKPHOS, BILITOT, PROT, ALBUMIN, INR in the last 168 hours.   INFECTIOUS No results for input(s): LATICACIDVEN, PROCALCITON in the last 168 hours.   ENDOCRINE CBG (last 3)  No results for input(s): GLUCAP in the last 72 hours.       IMAGING x48h  - image(s) personally visualized  -   highlighted in bold CT ABDOMEN PELVIS W CONTRAST  Result Date: 05/25/2021 CLINICAL DATA:  Colon carcinoma.  Staging. EXAM: CT ABDOMEN AND PELVIS WITH CONTRAST TECHNIQUE: Multidetector CT imaging of the abdomen and pelvis was performed using the standard protocol following bolus administration of intravenous contrast. RADIATION DOSE REDUCTION: This exam was performed  according to the departmental dose-optimization program which includes automated exposure control, adjustment of the mA and/or kV according to patient size and/or use of iterative reconstruction technique. CONTRAST:  77mL OMNIPAQUE IOHEXOL 300 MG/ML  SOLN COMPARISON:  06/21/2017 FINDINGS: Lower Chest: Small bilateral pleural effusions, mildly increased in size since previous study. Right middle lobe atelectasis. Hepatobiliary: No hepatic masses identified. Prior cholecystectomy. No evidence of biliary obstruction. Pancreas:  No mass or inflammatory changes. Spleen: Within normal limits in size and appearance. Adrenals/Urinary Tract: No masses identified. Moderate diffuse left renal atrophy. Several tiny sub-cm left renal cyst also noted. No evidence of ureteral calculi or hydronephrosis. Stomach/Bowel: Status post subtotal colectomy. No soft tissue masses identified. No evidence of obstruction, inflammatory process or abnormal fluid collections. Vascular/Lymphatic: No pathologically enlarged lymph nodes. No acute vascular findings. Aortic atherosclerotic calcification noted. Reproductive: Prior hysterectomy noted. Adnexal regions are unremarkable in appearance. Other:  None. Musculoskeletal: Lytic bone metastases are seen throughout the spine and pelvis which are new since previous study. Neurostimulator device seen in the left buttock with leads extending through bilateral sacral foramina. Right hip prosthesis also noted. IMPRESSION: No evidence of soft tissue metastatic disease within the abdomen or pelvis. Lytic bone metastases throughout the spine and pelvis. Small bilateral pleural effusions. Aortic Atherosclerosis (ICD10-I70.0). Electronically Signed   By: Marlaine Hind M.D.   On: 05/25/2021 12:44

## 2021-05-26 NOTE — Progress Notes (Addendum)
[facility] ?Progress Note ? ?3 Days Post-Op  ?Subjective: ?CC: c/o back pain and asking for pain medication. States she is tolerating PO but doesn't have much appetite. Denies nausea this AM. Reports flatus and had a BM yesterday. States she lives at home with her husband who is in good health and her 3 children. ? ?Objective: ?Vital signs in last 24 hours: ?Temp:  [97.2 ?F (36.2 ?C)-97.9 ?F (36.6 ?C)] 97.5 ?F (36.4 ?C) (03/10 0254) ?Pulse Rate:  [114-116] 114 (03/10 0254) ?Resp:  [15-18] 16 (03/10 0254) ?BP: (125-158)/(87-111) 130/91 (03/10 0254) ?SpO2:  [96 %-99 %] 96 % (03/10 0746) ?FiO2 (%):  [36 %] 36 % (03/10 0746) ?Last BM Date : 05/25/21 ? ?Intake/Output from previous day: ?03/09 0701 - 03/10 0700 ?In: 18 [P.O.:410] ?Out: 700 [Urine:700] ?Intake/Output this shift: ?No intake/output data recorded. ? ?PE: ?Gen:  Alert, chronically ill appearing female ?Card:  Regular rate and rhythm ?Pulm: labored with talking on 4L [place], CTAB, R chest wall w/ port-a-cath ?Abd: Soft, non-tender, non-distended, bowel sounds present in all 4 quadrants, no HSM ?Skin: warm and dry, no rashes  ?Psych: A&Ox3  ? ?Lab Results:  ?Recent Labs  ?  05/25/21 ?8841 06/04/2021 ?6606  ?WBC 17.2* 15.5*  ?HGB 8.8* 8.4*  ?HCT 27.5* 25.9*  ?PLT 132* 122*  ? ?BMET ?Recent Labs  ?  05/25/21 ?0359 06/12/2021 ?3016  ?NA 136 135  ?K 3.8 3.8  ?CL 103 99  ?CO2 27 28  ?GLUCOSE 110* 109*  ?BUN 27* 26*  ?CREATININE 0.54 0.57  ?CALCIUM 8.9 9.3  ? ?PT/INR ?No results for input(s): LABPROT, INR in the last 72 hours. ?CMP  ?   ?Component Value Date/Time  ? NA 135 06/06/2021 0539  ? NA 135 06/16/2019 1333  ? K 3.8 05/25/2021 0539  ? CL 99 06/05/2021 0539  ? CO2 28 06/13/2021 0539  ? GLUCOSE 109 (H) 06/02/2021 0539  ? GLUCOSE 104 (H) 03/11/2006 1059  ? BUN 26 (H) 06/03/2021 0539  ? BUN 31 (H) 06/16/2019 1333  ? CREATININE 0.57 06/13/2021 0539  ? CREATININE 0.73 04/25/2021 1300  ? CREATININE 0.91 03/16/2016 1539  ? CALCIUM 9.3 05/25/2021 0539  ? PROT 6.7  06/09/2021 1605  ? ALBUMIN 2.9 (L) 06/05/2021 1605  ? AST 52 (H) 05/25/2021 1605  ? AST 20 04/25/2021 1300  ? ALT 12 06/10/2021 1605  ? ALT 7 04/25/2021 1300  ? ALKPHOS 115 05/30/2021 1605  ? BILITOT 1.2 05/22/2021 1605  ? BILITOT 0.6 04/25/2021 1300  ? GFRNONAA >60 05/22/2021 0539  ? GFRNONAA >60 04/25/2021 1300  ? GFRAA >60 12/01/2019 1435  ? ?Lipase  ?   ?Component Value Date/Time  ? LIPASE 8 (L) 12/11/2011 1455  ? ? ? ? ? ?Studies/Results: ?CT ABDOMEN PELVIS W CONTRAST ? ?Result Date: 05/25/2021 ?CLINICAL DATA:  Colon carcinoma.  Staging. EXAM: CT ABDOMEN AND PELVIS WITH CONTRAST TECHNIQUE: Multidetector CT imaging of the abdomen and pelvis was performed using the standard protocol following bolus administration of intravenous contrast. RADIATION DOSE REDUCTION: This exam was performed according to the departmental dose-optimization program which includes automated exposure control, adjustment of the mA and/or kV according to patient size and/or use of iterative reconstruction technique. CONTRAST:  75mL OMNIPAQUE IOHEXOL 300 MG/ML  SOLN COMPARISON:  06/21/2017 FINDINGS: Lower Chest: Small bilateral pleural effusions, mildly increased in size since previous study. Right middle lobe atelectasis. Hepatobiliary: No hepatic masses identified. Prior cholecystectomy. No evidence of biliary obstruction. Pancreas:  No mass or inflammatory changes. Spleen:  Within normal limits in size and appearance. Adrenals/Urinary Tract: No masses identified. Moderate diffuse left renal atrophy. Several tiny sub-cm left renal cyst also noted. No evidence of ureteral calculi or hydronephrosis. Stomach/Bowel: Status post subtotal colectomy. No soft tissue masses identified. No evidence of obstruction, inflammatory process or abnormal fluid collections. Vascular/Lymphatic: No pathologically enlarged lymph nodes. No acute vascular findings. Aortic atherosclerotic calcification noted. Reproductive: Prior hysterectomy noted. Adnexal regions  are unremarkable in appearance. Other:  None. Musculoskeletal: Lytic bone metastases are seen throughout the spine and pelvis which are new since previous study. Neurostimulator device seen in the left buttock with leads extending through bilateral sacral foramina. Right hip prosthesis also noted. IMPRESSION: No evidence of soft tissue metastatic disease within the abdomen or pelvis. Lytic bone metastases throughout the spine and pelvis. Small bilateral pleural effusions. Aortic Atherosclerosis (ICD10-I70.0). Electronically Signed   By: Marlaine Hind M.D.   On: 05/25/2021 12:44   ? ?Anti-infectives: ?Anti-infectives (From admission, onward)  ? ? Start     Dose/Rate Route Frequency Ordered Stop  ? 05/24/21 1200  vancomycin (VANCOREADY) IVPB 500 mg/100 mL       ? 500 mg ?100 mL/hr over 60 Minutes Intravenous Every 24 hours 06/11/2021 1046 05/25/21 1334  ? 06/11/2021 1145  vancomycin (VANCOREADY) IVPB 750 mg/150 mL       ? 750 mg ?150 mL/hr over 60 Minutes Intravenous  Once 06/15/2021 1045 05/19/2021 1600  ? 05/19/21 2200  cefTRIAXone (ROCEPHIN) 1 g in sodium chloride 0.9 % 100 mL IVPB  Status:  Discontinued       ? 1 g ?200 mL/hr over 30 Minutes Intravenous Every 24 hours 05/19/2021 2213 05/19/21 1203  ? 05/19/21 2200  azithromycin (ZITHROMAX) 500 mg in sodium chloride 0.9 % 250 mL IVPB  Status:  Discontinued       ? 500 mg ?250 mL/hr over 60 Minutes Intravenous Every 24 hours 05/25/2021 2213 05/19/21 1203  ? 05/19/21 1300  levofloxacin (LEVAQUIN) tablet 500 mg       ? 500 mg Oral Daily 05/19/21 1202 05/29/21 0959  ? 05/27/2021 1745  cefTRIAXone (ROCEPHIN) 1 g in sodium chloride 0.9 % 100 mL IVPB       ? 1 g ?200 mL/hr over 30 Minutes Intravenous  Once 05/29/2021 1732 06/09/2021 2353  ? 06/10/2021 1745  azithromycin (ZITHROMAX) 500 mg in sodium chloride 0.9 % 250 mL IVPB       ? 500 mg ?250 mL/hr over 60 Minutes Intravenous  Once 06/15/2021 1732 05/19/21 0100  ? ?  ? ? ? ?Assessment/Plan ?Adenocarcinoma of colon with signet ring cell  features, non-obstructing ?- CEA pending, CT of the abdomen and pelvis without evidence of soft tissue metastasis in the abdomen. ?- Unfortunately the patient also has a new right upper lobe consolidation concerning for bronchogenic malignancy with numerous new lytic lesions throughout her axial skeleton. She has multiple medical co-morbidities, listed below, and appears chronically ill and deconditioned. She is undergoing medical and oncological workup of suspected additional neoplasm with metastasis. Agree with palliative care consult to establish goals of care. ?- no evidence of acute GI bleeding at this time, no evidence of bowel obstruction or ischemia. No emergent indications for surgery today.  ?- I will place this patient on GI tumor board for review, I do not recommend surgery during this admission, and question whether she would be a candidate for surgery in the future. General surgery will sign off.  ?Please call as needed. ? ?FEN - soft diet ?  VTE - SCDs only due to acute on chronic anemia ?ID - currently levaquin 3/3>> ?  ?COPD/emphysema/MAI/new pulmonary mass on 4L O2 chronically ?Erythropoietin anemia followed by oncology Dr. Marin Olp ?A fib/flutter on eliquis (currently held) ?CHF ?Multifocal pneumonia ?UTI ?Protein calorie malnutrition ?Recurrent falls, deconditioned ?Fibromyalgia/ chronic pain ? ? LOS: 7 days  ? ?I reviewed nursing notes, Consultant oncology and GI notes, last 24 h vitals and pain scores, last 48 h intake and output, last 24 h CBC, BMP, CEA, and last 24 h imaging results (CT abdomen/pelvis). ? ? ?Obie Dredge, PA-C ?[facility] ?Please see Amion for pager number during day hours 7:00am-4:30pm ? ? ? ? ? ? ?

## 2021-05-26 NOTE — Consult Note (Addendum)
NAME:  [name], MRN:  [id number], DOB:  07-11-1945, LOS: 7 ADMISSION DATE:  05/24/2021, CONSULTATION DATE:  05/19/21 REFERRING MD:  [facility], CHIEF COMPLAINT:  MAI/ bronchiectasis  with new effusion, lung nodule   BRIEF  75 yowf quit smoking 87 y PTA with melena and followed by [facility] for MAI with last rx June 2022 tol poorly and since then general state of decline, uses w/c to go anyhere outside the house with rattling cough productive of white mucus and doe x more than room to room though also limited by weakness.   Denies dysphagia or cp fever chills or NS   Pertinent  Medical History  Anemia  Arthritis, Atrial fibrillation  CAD  COPD\  Diverticulitis  Erythropoietin deficiency anemia   Essential hypertension Fibromyalgia Gastroparesis GERD Hepatitis History of pneumonia MRSA infection,  gastric ulcer Iron malabsorption   LBBB (left bundle branch block)  Oxygen dependent Pacemaker Pleural effusion,  Primary dilated cardiomyopathy  Silent aspiration Small bowel obstruction Urinary incontinence Venous embolism and thrombosis of subclavian vein (  Vitamin B12 deficiency   EVENTS  3/2: Admitted, started on PPI given melena, transfused 2 units; given antibiotics given multifocal PNA on CXR 05/19/21 - Lying quietly in bed on 4lpm NP nad cc dry mouth as npo for endoscopy 3/3: GI consulted for melena; CT chest showed RUL mass, loculated effusion and lytic lesions scattered in bone --> Pulmonology consulted 3/4: EGD significant for gastritis only  3/7: Colonoscopy showed a subtle mucosal irregularity at her anastomosis, biopsied, but no definite source of bleeding 3/8: Biopsy from colonoscopy resulted adenoCA 3/9: Gen Surg, Palliative Care and Oncology consulted  - [facility] consideirng IR consult for RUL mass bx due to RUL mass with Lytic bone lesions    Interim History / Subjective:    3/.10/23 - [facility] recalling PCCM. [name] Has pulmonary findings that needs  ddifferentiating of infection v mets to lung. There is IR consult pending for the lytic bone lesion  She has failure to thrive and is ECOG 04. Is on 4L [place] oper [facility]. PEr family husband and patoient -> on o2 x 2 years at 4L . Not driven in 6 weeks. ECOG 3-4 x 4 weeks and bed bouned 2 weeks prior to admit and failure to thrive and weight loss and deconditioned  Objective   Blood pressure (!) 130/91, pulse (!) 114, temperature (!) 97.5 F (36.4 C), temperature source Oral, resp. rate 16, height 5' (1.524 m), weight 41.9 kg, SpO2 96 %.    FiO2 (%):  [36 %] 36 %   Intake/Output Summary (Last 24 hours) at 06/14/2021 0845 Last data filed at 05/19/2021 0700 Gross per 24 hour  Intake 410 ml  Output 700 ml  Net -290 ml    Estimated body mass index is 18.04 kg/m as calculated from the following:   Height as of this encounter: 5' (1.524 m).   Weight as of this encounter: 41.9 kg.  Filed Weights   05/22/21 0439 05/28/2021 0635 05/24/21 0540  Weight: 41.6 kg 40 kg 41.9 kg    Examination: General Appearance:  Looks deconditioned. Frail. On o2. Lying in bed Head:  Normocephalic, without obvious abnormality, atraumatic Eyes:  PERRL - yes, conjunctiva/corneas - muddy     Ears:  Normal external ear canals, both ears Nose:  G tube - no. 4LNC o2+ Throat:  ETT TUBE - no , OG tube - no.  Neck:  Supple,  No enlargement/tenderness/nodules Lungs: Clear to auscultation bilaterally Heart:  S1  and S2 normal, no murmur, CVP - no.  Pressors - no Abdomen:  Soft, no masses, no organomegaly Genitalia / Rectal:  Not done Extremities:  Extremities- intact Skin:  ntact in exposed areas . Sacral area - per patient has sacral decub and confirme das unstaagle eschar per RN Neurologic:  Sedation - none -> RASS - +1 . Moves all 4s - yes. CAM-ICU - neg . Orientation - x3+     Ct chest with contrast 06/04/2021  - personally visualized.  Interval development since April 2022 of masslike consolidation within the a  right upper lobe in an area of more focal consolidation noted on prior examination suspicious for primary bronchogenic malignancy. Associated shotty right pre-vascular, and periaortic adenopathy as well as necrotic right hilar adenopathy. Interval development of numerous lytic lesions throughout the visualized axial skeleton. PET CT examination may be helpful for further evaluation.   Superimposed changes of a chronic atypical infection or infectious bronchiolitis with bronchiolectasis, airway impaction, and peripheral subpleural consolidation reticulation grossly stable since prior examination.   Interval development of partially loculated right pleural effusion. Diagnostic thoracentesis may be helpful for further management.   Extensive multi-vessel coronary artery calcification. Morphologic changes in keeping with pulmonary arterial hypertension.   Aortic Atherosclerosis (ICD10-I70.0).       Assessment & Plan:  #Background prior to admit and present on admit  - Failure to Thrive with unintentioal weight loss - Cachexia - BMI 18  - Severe protein calorie malnurition - alb 2.9 at admit  - Chronic respirartory fialrue - 4L [place]  - Bed bound eCOG 4 - Hx of sacral deub prior to admit - eschar unstageable per RN - Hx of smokng   #Present on admit  - Colon Cancer  - admission problemn - Lytic bone mets - ? Due to above  - At admission also   - small bilateral pleural effusions  - Hilar node - necrotic  - RUL consolidation with mass and loculated subpleural fluid cllection anteriorky  Plan  - inpatient PET Scan requested  - Regarding pleural effusioss - > too small to tap - Regarding Necrotic mediastinal;/hilar node -> can do EBUS but a) can be non-diagnostic due to necrosis; b) what is the value -> per Oncology  =diagnosis and molecular markers need with view towards immunotherapoy; c) what is the risk -> modeerate but will get ABG - Regarding RUL mass -> this is too necrotic  to give yield with CT guided TTNA. Bronch can help identify any endobronchial lesion  - recommend goals of care and code status   Dr Chase Caller will reach out to Dr Valeta Harms regarding procedures   Best Practice (right click and "Reselect all SmartList Selections" daily)  Per [facility]  D./w  [facility] MD Dr Loleta Books, patient and husband   LABS    PULMONARY No results for input(s): PHART, PCO2ART, PO2ART, HCO3, TCO2, O2SAT in the last 168 hours.  Invalid input(s): PCO2, PO2  CBC Recent Labs  Lab 05/24/21 1043 05/25/21 0359 05/19/2021 0539  HGB 7.0* 8.8* 8.4*  HCT 22.5* 27.5* 25.9*  WBC 16.6* 17.2* 15.5*  PLT 137* 132* 122*    COAGULATION No results for input(s): INR in the last 168 hours.  CARDIAC  No results for input(s): TROPONINI in the last 168 hours. No results for input(s): PROBNP in the last 168 hours.   CHEMISTRY Recent Labs  Lab 05/21/21 1054 05/22/21 0515 05/25/21 0359 05/30/2021 0539  NA 132* 132* 136 135  K 3.7 3.8 3.8 3.8  CL 93* 94* 103 99  CO2 30 31 27 28   GLUCOSE 155* 106* 110* 109*  BUN 28* 29* 27* 26*  CREATININE 0.50 0.47 0.54 0.57  CALCIUM 9.2 8.8* 8.9 9.3   Estimated Creatinine Clearance: 39.6 mL/min (by C-G formula based on SCr of 0.57 mg/dL).   LIVER No results for input(s): AST, ALT, ALKPHOS, BILITOT, PROT, ALBUMIN, INR in the last 168 hours.   INFECTIOUS No results for input(s): LATICACIDVEN, PROCALCITON in the last 168 hours.   ENDOCRINE CBG (last 3)  No results for input(s): GLUCAP in the last 72 hours.       IMAGING x48h  - image(s) personally visualized  -   highlighted in bold CT ABDOMEN PELVIS W CONTRAST  Result Date: 05/25/2021 CLINICAL DATA:  Colon carcinoma.  Staging. EXAM: CT ABDOMEN AND PELVIS WITH CONTRAST TECHNIQUE: Multidetector CT imaging of the abdomen and pelvis was performed using the standard protocol following bolus administration of intravenous contrast. RADIATION DOSE REDUCTION: This exam was performed  according to the departmental dose-optimization program which includes automated exposure control, adjustment of the mA and/or kV according to patient size and/or use of iterative reconstruction technique. CONTRAST:  35mL OMNIPAQUE IOHEXOL 300 MG/ML  SOLN COMPARISON:  06/21/2017 FINDINGS: Lower Chest: Small bilateral pleural effusions, mildly increased in size since previous study. Right middle lobe atelectasis. Hepatobiliary: No hepatic masses identified. Prior cholecystectomy. No evidence of biliary obstruction. Pancreas:  No mass or inflammatory changes. Spleen: Within normal limits in size and appearance. Adrenals/Urinary Tract: No masses identified. Moderate diffuse left renal atrophy. Several tiny sub-cm left renal cyst also noted. No evidence of ureteral calculi or hydronephrosis. Stomach/Bowel: Status post subtotal colectomy. No soft tissue masses identified. No evidence of obstruction, inflammatory process or abnormal fluid collections. Vascular/Lymphatic: No pathologically enlarged lymph nodes. No acute vascular findings. Aortic atherosclerotic calcification noted. Reproductive: Prior hysterectomy noted. Adnexal regions are unremarkable in appearance. Other:  None. Musculoskeletal: Lytic bone metastases are seen throughout the spine and pelvis which are new since previous study. Neurostimulator device seen in the left buttock with leads extending through bilateral sacral foramina. Right hip prosthesis also noted. IMPRESSION: No evidence of soft tissue metastatic disease within the abdomen or pelvis. Lytic bone metastases throughout the spine and pelvis. Small bilateral pleural effusions. Aortic Atherosclerosis (ICD10-I70.0). Electronically Signed   By: Marlaine Hind M.D.   On: 05/25/2021 12:44

## 2021-05-26 NOTE — Progress Notes (Signed)
?   05/25/21 2045  ?Assess: MEWS Score  ?Temp 97.9 ?F (36.6 ?C)  ?BP (!) 158/107  ?Pulse Rate (!) 116  ?Resp 18  ?SpO2 99 %  ?O2 Device Room Air  ?Assess: MEWS Score  ?MEWS Temp 0  ?MEWS Systolic 0  ?MEWS Pulse 2  ?MEWS RR 0  ?MEWS LOC 0  ?MEWS Score 2  ?MEWS Score Color Yellow  ?Assess: if the MEWS score is Yellow or Red  ?Were vital signs taken at a resting state? Yes  ?Focused Assessment No change from prior assessment  ?Does the patient meet 2 or more of the SIRS criteria? No  ?MEWS guidelines implemented *See Row Information* No, previously yellow, continue vital signs every 4 hours  ?Treat  ?MEWS Interventions Administered scheduled meds/treatments;Administered prn meds/treatments  ?Pain Scale 0-10  ?Pain Score 8  ?Pain Type Chronic pain  ?Pain Location Back  ?Pain Orientation Lower  ?Pain Descriptors / Indicators Aching;Discomfort  ?Pain Frequency Constant  ?Pain Intervention(s) Medication (See eMAR);Repositioned  ?Multiple Pain Sites No  ?Breathing 0  ?Negative Vocalization 0  ?Facial Expression 1  ?Body Language 0  ?Complains of Nausea /  Vomiting  ?Take Vital Signs  ?Increase Vital Sign Frequency   ?(N/A, continue Q4)  ?Escalate  ?MEWS: Escalate Yellow: discuss with charge nurse/RN and consider discussing with provider and RRT  ?Notify: Charge Nurse/RN  ?Name of Charge Nurse/RN Notified Renita RN  ?Date Charge Nurse/RN Notified 05/25/21  ?Time Charge Nurse/RN Notified 2046  ?Document  ?Patient Outcome Stabilized after interventions  ?Progress note created (see row info) Yes  ?Assess: SIRS CRITERIA  ?SIRS Temperature  0  ?SIRS Pulse 1  ?SIRS Respirations  0  ?SIRS WBC 0  ?SIRS Score Sum  1  ? ? ?

## 2021-05-26 NOTE — Anesthesia Procedure Notes (Signed)
Procedure Name: Intubation ?Date/Time: 05/25/2021 2:58 PM ?Performed by: Dariah Mcsorley D, CRNA ?Pre-anesthesia Checklist: Patient identified, Emergency Drugs available, Suction available and Patient being monitored ?Patient Re-evaluated:Patient Re-evaluated prior to induction ?Oxygen Delivery Method: Circle system utilized ?Preoxygenation: Pre-oxygenation with 100% oxygen ?Induction Type: IV induction ?Ventilation: Mask ventilation without difficulty ?Laryngoscope Size: Mac and 3 ?Tube type: Oral ?Tube size: 8.5 mm ?Number of attempts: 1 ?Airway Equipment and Method: Stylet ?Placement Confirmation: ETT inserted through vocal cords under direct vision, positive ETCO2 and breath sounds checked- equal and bilateral ?Secured at: 21 cm ?Tube secured with: Tape ?Dental Injury: Teeth and Oropharynx as per pre-operative assessment  ? ? ? ? ?

## 2021-05-26 NOTE — Progress Notes (Signed)
? ? ?  Interdisciplinary Goals of Care Family Meeting ? ? ?Date carried out:: 05/25/2021 ? ?Location of the meeting: Bedside ? ?Member's involved: Physician, Family Member or next of kin, and Other: Dr danford, husband, dauhter Bryson Ha on phone ? ?Durable Power of Tour manager: husband   ? ?Discussion: We discussed goals of care for [name] .  -During this case conference I went to share the news that Dr. Marin Olp the oncologist feels there is value in doing a pulmonary bronchoscopy procedure to get tissue diagnosis that can potentially aid immunotherapy.  PET scan is not possible according to radiology currently.  I discussed Dr. Leory Plowman Icard or proceduralist and is willing to do a endobronchial ultrasound and flexible video bronchoscopy and possible endobronchial biopsy if there is an endobronchial lesion.  In addition he will do a thoracentesis or chest tube placement for the right anterior wall empyema/fluid collection that is loculated. ? ?Daughter and the family clearly expressed that they believe in the will of God.  They believe in getting all psychological help and medical help to get to the diagnosis.  Did explain to them the risks of bronchoscopy and general anesthesia including inability to come off the respirator, cardiac arrest, bleeding, hemothorax, pneumothorax, infection and also known diagnosis.  They fully understand.  They verbalized agreement to proceed.  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 ? ?Code status: Full Code ? ?Disposition: Continue current acute care ? ?Time spent for the meeting: 20 min  ? ?We will post for bronchoscopy and chest tube placement by DR Icard.  Dr. Loleta Books is confirmed n.p.o. status ? ? ? ? ?SIGNATURE  ? ? ?Dr. Brand Males, M.D., F.C.C.P,  ?Pulmonary and Critical Care Medicine ?Staff Physician, [place] ?Center Director - Interstitial Lung Disease  Program   ?[name] ?[place], [place], [address] ? ?NPI Number:  NPI #[id number] ?DEA Number: [id number] ? ?Pager: [phone], If no answer  -> Check AMION or Try [phone] ?Telephone (clinical office): [phone] ?Telephone (research): [phone] ? ?12:05 PM ?06/16/2021 ? ?

## 2021-05-26 NOTE — Consult Note (Signed)
Referral MD  Reason for Referral: Metastatic adenocarcinoma of the colon-bone metastasis   Chief Complaint  Patient presents with   Dehydration   Weakness   Melena  : As having back in chest pain.  I bled through my rectum.  HPI: [name] is well-known to me.  She is a very charming 76 year old white female.  She has multiple medical problems.  We see her because of anemia of erythropoietin deficiency.  She is on chronic oxygen therapy.  She has chronic atrial fibrillation.  She has been on Eliquis.  She apparently fell a month ago.  She fractured her left wrist.  She has a cast on.  She was admitted on 06/14/2021.  She had melena.  She was having back pain.  When she came in, her white cell count was 10.8.  Hemoglobin was 6.  Platelet count 308K.  Her MCV was 93.  She had the upper endoscopy on 05/17/2021.  This was unremarkable.  She had a colonoscopy on 05/21/2021.  This showed the subsequent total colectomy anastomosis.  There is some subtle thickening.  There is no obvious mass.  Biopsies were done and surprising, at the anastomotic site, there was noted to be poorly differentiated carcinoma with signet cell features. She had a CT scan done of the abdomen pelvis on 05/25/2021.  This showed no obvious soft tissue metastatic disease.  She had lytic bone metastasis throughout the spine and pelvis.  A CT of the chest done showed masslike consolidation within the right upper lobe.  This is suspicious for a primary bronchogenic carcinoma.  She had loculated right pleural effusion.  She had multiple vessel coronary artery disease.  There really was not much in the way of mediastinal adenopathy.  However, there is a necrotic right hilar lymph node.  She is not eating well.  She just does not feel all that well.  I would have to say that her performance status is probably ECOG 2 at best.     Past Medical History:  Diagnosis Date   Anemia    Arthritis    Atrial fibrillation ([place])    CAD  (coronary artery disease)    Mild disease per cath in 2011   COPD (chronic obstructive pulmonary disease) ([place])    Home O2   Diverticulitis    Elbow fracture, left aug 2012   Erythropoietin deficiency anemia 06/19/2018   Essential hypertension    Fibromyalgia    Gastroparesis    GERD (gastroesophageal reflux disease)    H/O: GI bleed    from Pradaxa   Hepatitis    Hx of in high school    History of pneumonia    Recurrent   Hx MRSA infection    Hx of gastric ulcer    Hx of stress fx 10/2009   Right hip    Iron malabsorption 08/14/2017   LBBB (left bundle branch block)    Oxygen dependent    2 liters via nasal cannula at all times   Pacemaker    CRT therapy; followed by Dr. Caryl Comes   Pleural effusion     s/p right thoracentesis 03/09   Primary dilated cardiomyopathy ([place])    EF 45 to 50% per echo in Jan 2012   RLS (restless legs syndrome)    Silent aspiration    Small bowel obstruction ([facility])    Urinary incontinence    Venous embolism and thrombosis of subclavian vein ([place])    After pacemaker insertion Oct 2011   Vitamin  B12 deficiency    Wears glasses    Reading  :   Past Surgical History:  Procedure Laterality Date   APPENDECTOMY     BIOPSY  05/29/2021   Procedure: BIOPSY;  Surgeon: Thornton Park, MD;  Location: [facility];  Service: Gastroenterology;;   [place] N/A 06/19/2019   Procedure: BIV PACEMAKER GENERATOR CHANGEOUT;  Surgeon: Deboraha Sprang, MD;  Location: [facility];  Service: Cardiovascular;  Laterality: N/A;   BRONCHIAL WASHINGS  05/25/2019   Procedure: BRONCHIAL WASHINGS;  Surgeon: Chesley Mires, MD;  Location: [facility];  Service: Endoscopy;;   CARDIAC CATHETERIZATION  04/08/2006, 11/16/2009   CHOLECYSTECTOMY N/A 06/27/2017   Procedure: OPEN CHOLECYSTECTOMY;  Surgeon: Ralene Ok, MD;  Location: [facility];  Service: General;  Laterality: N/A;   COLECTOMY     Intestinal resection (5times)   COLECTOMY  02/04/2000   ex.  lap., intra-abd. subtotal colectomy with ileosigmoid colon anastomosies and lysis of adhesions   COLONOSCOPY WITH PROPOFOL N/A 05/22/2021   Procedure: COLONOSCOPY WITH PROPOFOL;  Surgeon: Thornton Park, MD;  [facility]: [facility];  Service: Gastroenterology;  Laterality: N/A;   CYSTOSCOPY  11/11/2008   CYSTOSCOPY W/ RETROGRADES  11/11/2008   right   CYSTOSCOPY WITH INJECTION  04/30/2006   transurethral collagen injection; incision vaginal stenosis   ERCP N/A 06/23/2017   Procedure: ENDOSCOPIC RETROGRADE CHOLANGIOPANCREATOGRAPHY (ERCP);  Surgeon: Carol Ada, MD;  Location: [facility];  Service: Endoscopy;  Laterality: N/A;   ERCP N/A 06/24/2017   Procedure: ENDOSCOPIC RETROGRADE CHOLANGIOPANCREATOGRAPHY (ERCP);  Surgeon: [name], MD;  Location: [facility];  Service: Endoscopy;  Laterality: N/A;   ESOPHAGOGASTRODUODENOSCOPY N/A 02/02/2013   Procedure: ESOPHAGOGASTRODUODENOSCOPY (EGD);  Surgeon: Lafayette Dragon, MD;  Location: [facility];  Service: Endoscopy;  Laterality: N/A;   ESOPHAGOGASTRODUODENOSCOPY (EGD) WITH PROPOFOL N/A 05/19/2021   Procedure: ESOPHAGOGASTRODUODENOSCOPY (EGD) WITH PROPOFOL;  Surgeon: Juanita Craver, MD;  [facility]: [facility];  Service: Gastroenterology;  Laterality: N/A;   EXPLORATORY LAPAROTOMY  04/27/2009   lysis of adhesions, gastrostomy tube   GASTROCUTANEOUS FISTULA CLOSURE     INTERSTIM IMPLANT PLACEMENT  05/28/2006 - stage I   06/05/2006 - stage II   INTERSTIM IMPLANT PLACEMENT  02/05/2012   Procedure: [name] IMPLANT FIRST STAGE;  Surgeon: Reece Packer, MD;  Location: [facility];  Service: Urology;  Laterality: N/A;  Replacement of Interstim Lead      INTERSTIM IMPLANT REVISION  03/06/2011   Procedure: REVISION OF [name];  Surgeon: Reece Packer, MD;  Location: [facility];  Service: Urology;  Laterality: N/A;  Replacement of Neurostimulator   INTERSTIM IMPLANT REVISION  10/23/2007   IR IMAGING GUIDED PORT INSERTION  10/14/2017   PACEMAKER PLACEMENT   12/21/2009   Peg removed     with complications   PORT-A-CATH REMOVAL  07/27/2011   Procedure: REMOVAL PORT-A-CATH;  Surgeon: Rolm Bookbinder, MD;  Location: [facility];  Service: General;  Laterality: N/A;   PORTACATH PLACEMENT  12/16/2009   SAVORY DILATION N/A 02/02/2013   Procedure: SAVORY DILATION;  Surgeon: Lafayette Dragon, MD;  [facility];  Service: Endoscopy;  Laterality: N/A;   SMALL INTESTINE SURGERY  05/20/2001   ex. lap., resection of small bowel stricture; gastrostomy; insertion central line   TEE WITHOUT CARDIOVERSION N/A 07/17/2019   Procedure: TRANSESOPHAGEAL ECHOCARDIOGRAM (TEE);  Surgeon: Donato Heinz, MD;  Location: [facility] ENDOSCOPY;  Service: Cardiovascular;  Laterality: N/A;   TONSILLECTOMY  age 33   TOTAL ABDOMINAL HYSTERECTOMY  complete   TOTAL HIP ARTHROPLASTY  08/03/2011   Procedure: TOTAL HIP ARTHROPLASTY ANTERIOR APPROACH;  Surgeon: Mcarthur Rossetti, MD;  Location: [facility];  Service: Orthopedics;  Laterality: Right;   VIDEO BRONCHOSCOPY N/A 05/25/2019   Procedure: VIDEO BRONCHOSCOPY WITHOUT FLUORO;  Surgeon: Chesley Mires, MD;  Location: [facility];  Service: Endoscopy;  Laterality: N/A;  :   Current Facility-Administered Medications:    oxyCODONE (Oxy IR/ROXICODONE) immediate release tablet 10 mg, 10 mg, Oral, Q4H PRN, 10 mg at 05/31/2021 0303 **AND** acetaminophen (TYLENOL) tablet 325 mg, 325 mg, Oral, Q4H PRN, Mariel Aloe, MD, 325 mg at 05/25/21 1326   albuterol (PROVENTIL) (2.5 MG/3ML) 0.083% nebulizer solution 2.5 mg, 2.5 mg, Nebulization, Q6H PRN, Mariel Aloe, MD   ascorbic acid (VITAMIN C) tablet 250 mg, 250 mg, Oral, BID, Mariel Aloe, MD, 250 mg at 05/25/21 1933   atorvastatin (LIPITOR) tablet 20 mg, 20 mg, Oral, QHS, Mariel Aloe, MD, 20 mg at 05/25/21 2147   Chlorhexidine Gluconate Cloth 2 % PADS 6 each, 6 each, Topical, Daily, Mariel Aloe, MD, 6 each at 05/25/21 1037   collagenase (SANTYL) ointment, ,  Topical, Daily, Mariel Aloe, MD, Given at 05/25/21 1235   feeding supplement (BOOST / RESOURCE BREEZE) liquid 1 Container, 1 Container, Oral, BID BM, Mariel Aloe, MD, 1 Container at 05/21/2021 1658   furosemide (LASIX) tablet 40 mg, 40 mg, Oral, Daily, Mariel Aloe, MD, 40 mg at 05/25/21 1020   gabapentin (NEURONTIN) capsule 300 mg, 300 mg, Oral, BID, 300 mg at 05/25/21 1529 **AND** gabapentin (NEURONTIN) capsule 600 mg, 600 mg, Oral, QHS, Mariel Aloe, MD, 600 mg at 05/25/21 2147   hydrALAZINE (APRESOLINE) injection 10 mg, 10 mg, Intravenous, Q6H PRN, Mariel Aloe, MD   levofloxacin (LEVAQUIN) tablet 500 mg, 500 mg, Oral, Daily, Nettey, Ralph A, MD, 500 mg at 05/25/21 1019   morphine (PF) 4 MG/ML injection 4 mg, 4 mg, Intravenous, Q4H PRN, Mariel Aloe, MD, 4 mg at 05/25/21 2222   multivitamin with minerals tablet 1 tablet, 1 tablet, Oral, Daily, Mariel Aloe, MD, 1 tablet at 05/25/21 1020   nutrition supplement (JUVEN) (JUVEN) powder packet 1 packet, 1 packet, Oral, BID BM, Mariel Aloe, MD, 1 packet at 05/25/21 1231   nystatin (MYCOSTATIN/NYSTOP) topical powder, , Topical, TID, Danford, Suann Larry, MD, Given at 05/25/21 2150   ondansetron (ZOFRAN) tablet 4 mg, 4 mg, Oral, Q6H PRN, 4 mg at 05/24/21 2057 **OR** ondansetron (ZOFRAN) injection 4 mg, 4 mg, Intravenous, Q6H PRN, Mariel Aloe, MD, 4 mg at 06/15/2021 0306   pantoprazole (PROTONIX) EC tablet 40 mg, 40 mg, Oral, Daily, Collene Mares, Jyothi, MD, 40 mg at 05/25/21 1020   prochlorperazine (COMPAZINE) injection 10 mg, 10 mg, Intravenous, Q6H PRN, Danford, Suann Larry, MD, 10 mg at 05/25/21 1933   sodium chloride flush (NS) 0.9 % injection 10-40 mL, 10-40 mL, Intracatheter, Q12H, Nettey, Ralph A, MD, 10 mL at 05/25/21 2150   sodium chloride flush (NS) 0.9 % injection 10-40 mL, 10-40 mL, Intracatheter, PRN, Mariel Aloe, MD   umeclidinium bromide (INCRUSE ELLIPTA) 62.5 MCG/ACT 1 puff, 1 puff, Inhalation, Daily, Mariel Aloe, MD, 1 puff at 05/25/21 0817   zinc sulfate capsule 220 mg, 220 mg, Oral, Daily, Mariel Aloe, MD, 220 mg at 05/25/21 1933   zolpidem (AMBIEN) tablet 5 mg, 5 mg, Oral, QHS PRN, Mariel Aloe, MD, 5 mg at 05/25/21 2147:   vitamin  C  250 mg Oral BID   atorvastatin  20 mg Oral QHS   Chlorhexidine Gluconate Cloth  6 each Topical Daily   collagenase   Topical Daily   feeding supplement  1 Container Oral BID BM   furosemide  40 mg Oral Daily   gabapentin  300 mg Oral BID   And   gabapentin  600 mg Oral QHS   levofloxacin  500 mg Oral Daily   multivitamin with minerals  1 tablet Oral Daily   nutrition supplement (JUVEN)  1 packet Oral BID BM   nystatin   Topical TID   pantoprazole  40 mg Oral Daily   sodium chloride flush  10-40 mL Intracatheter Q12H   umeclidinium bromide  1 puff Inhalation Daily   zinc sulfate  220 mg Oral Daily  :   Allergies  Allergen Reactions   Dabigatran Etexilate Mesylate Other (See Comments)    INTERNAL BLEEDING- Pradaxa   Augmentin [Amoxicillin-Pot Clavulanate] Diarrhea and Nausea Only    Did it involve swelling of the face/tongue/throat, SOB, or low BP? No Did it involve sudden or severe rash/hives, skin peeling, or any reaction on the inside of your mouth or nose? No Did you need to seek medical attention at a hospital or doctor's office? No When did it last happen? 02/2019 If all above answers are "NO", may proceed with cephalosporin use.    Talwin [Pentazocine] Other (See Comments)    Hallucinations   :   Family History  Problem Relation Age of Onset   Heart disease Father    Stroke Mother    Heart disease Mother    Heart disease Brother    Breast cancer Maternal Aunt    Heart disease Paternal Aunt    Diabetes Paternal Grandfather    Colon cancer Neg Hx   :   Social History   Socioeconomic History   Marital status: Married    Spouse name: Not on file   Number of children: 5   Years of education: Not on file   Highest  education level: Not on file  Occupational History   Occupation: disabled    Employer: RETIRED  Tobacco Use   Smoking status: Former    Packs/day: 1.00    Years: 20.00    Pack years: 20.00    Types: Cigarettes    Quit date: 03/19/1986    Years since quitting: 35.2   Smokeless tobacco: Never  Vaping Use   Vaping Use: Never used  Substance and Sexual Activity   Alcohol use: No    Alcohol/week: 0.0 standard drinks   Drug use: No   Sexual activity: Yes  Other Topics Concern   Not on file  Social History Narrative   Occupation: Disabled   Married - second time - biologic daughter (in [place]) from first marriage - adopted 4 3 alive with second marriage   Alcohol Use - no   Illicit Drug Use - no   Patient is a former smoker, quit 1988 x47yrs, <1ppd.    Daily Caffeine Use   01/20/2015 - updated   Social Determinants of Health   Financial Resource Strain: Medium Risk   Difficulty of Paying Living Expenses: Somewhat hard  Food Insecurity: No Food Insecurity   Worried About Charity fundraiser in the Last Year: Never true   Ran Out of Food in the Last Year: Never true  Transportation Needs: No Transportation Needs   Lack of Transportation (Medical): No   Lack of Transportation (  Non-Medical): No  Physical Activity: Insufficiently Active   Days of Exercise per Week: 6 days   Minutes of Exercise per Session: 20 min  Stress: No Stress Concern Present   Feeling of Stress : Not at all  Social Connections: Moderately Integrated   Frequency of Communication with Friends and Family: More than three times a week   Frequency of Social Gatherings with Friends and Family: More than three times a week   Attends Religious Services: More than 4 times per year   Active Member of [employer] or Organizations: No   Attends Archivist Meetings: Never   Marital Status: Married  Human resources officer Violence: Not At Risk   Fear of Current or Ex-Partner: No   Emotionally Abused: No   Physically  Abused: No   Sexually Abused: No  :  Review of Systems  Constitutional:  Positive for malaise/fatigue.  HENT: Negative.    Eyes: Negative.   Respiratory:  Positive for shortness of breath and wheezing.   Cardiovascular:  Positive for chest pain and palpitations.  Gastrointestinal:  Positive for blood in stool and melena.  Genitourinary: Negative.   Musculoskeletal:  Positive for back pain and myalgias.  Skin: Negative.   Neurological: Negative.   Endo/Heme/Allergies: Negative.   Psychiatric/Behavioral: Negative.      Exam: Patient Vitals for the past 24 hrs:  BP Temp Temp src Pulse Resp SpO2  05/17/2021 0254 (!) 130/91 (!) 97.5 F (36.4 C) Oral (!) 114 16 96 %  05/25/21 2045 (!) 158/107 97.9 F (36.6 C) Oral (!) 116 18 99 %  05/25/21 1740 (!) 157/111 (!) 97.4 F (36.3 C) Oral (!) 114 15 99 %  05/25/21 1321 125/87 (!) 97.2 F (36.2 C) Oral (!) 116 18 98 %  05/25/21 0817 -- -- -- -- -- 96 %   Physical Exam Vitals reviewed.  HENT:     Head: Normocephalic and atraumatic.  Eyes:     Pupils: Pupils are equal, round, and reactive to light.  Cardiovascular:     Rate and Rhythm: Normal rate and regular rhythm.     Heart sounds: Normal heart sounds.  Pulmonary:     Effort: Pulmonary effort is normal.     Breath sounds: Normal breath sounds.  Abdominal:     General: Bowel sounds are normal.     Palpations: Abdomen is soft.  Musculoskeletal:        General: No tenderness or deformity. Normal range of motion.     Cervical back: Normal range of motion.  Lymphadenopathy:     Cervical: No cervical adenopathy.  Skin:    General: Skin is warm and dry.     Findings: No erythema or rash.  Neurological:     Mental Status: She is alert and oriented to person, place, and time.  Psychiatric:        Behavior: Behavior normal.        Thought Content: Thought content normal.        Judgment: Judgment normal.      Recent Labs    05/25/21 0359 05/30/2021 0539  WBC 17.2* 15.5*   HGB 8.8* 8.4*  HCT 27.5* 25.9*  PLT 132* 122*    Recent Labs    05/25/21 0359 06/07/2021 0539  NA 136 135  K 3.8 3.8  CL 103 99  CO2 27 28  GLUCOSE 110* 109*  BUN 27* 26*  CREATININE 0.54 0.57  CALCIUM 8.9 9.3    Blood smear review: None  Pathology: See  above    Assessment and Plan: [name] is a very nice 76 year old white female.  This is quite interesting.  I had to believe that there might be a couple problems here.  I cannot recall the last time I saw a colon cancer that metastasized only to the bones and not to the lung or liver.  As such, I worry about what might be in the bones is not colon cancer.  She may have a localized recurrence.  We will see what a CEA level is.  I do think that she is going to need to have some kind of bronchoscopy to biopsy this right hilar lymph node if possible.  I do not know if any type of bone biopsy could be done.  This would be much more difficult.  I would consider a PET scan on her.  Unfortunately, this cannot be done as an inpatient.  I think the ultimate problem is can be trying to treat her.  She is not in the best shape.  She has multiple health issues.  Will be very difficult to try to get her on therapy given her health issues.  For right now, I do think we have to figure out what is actually going on with her.  Again she has this anastomotic recurrence.  There is nothing on the CT scan that looks like soft tissue recurrence.  There is no obvious fullness that seems noted on the CT scan.  The colonoscopy seems to suggest that this area of anastomosis was subtle with changes.  She clearly has more suggestive changes of bronchogenic carcinoma with the lung.  I think this is where the "money" is going to be.  Hopefully, pulmonary might be able to do a bronchoscopy.  We might think about a bone scan on her see if that shows up with metastatic disease in the bones.  Again, the CEA level will be helpful.  We will follow along.   She had been our patient for several years.  I just hate that she now has to deal with this problem.  Lattie Haw, MD  2 Cor 5:7

## 2021-05-26 NOTE — Progress Notes (Signed)
OT Cancellation Note ? ?Patient Details ?Name: [name] ?MRN: [id number] ?DOB: May 12, 1945 ? ? ?Cancelled Treatment:    Reason Eval/Treat Not Completed: Other (comment). Patient declined. She has been NPO and awaiting a procedure with will occur soon. Will f/u as able. ? ?Lenward Chancellor ?05/19/2021, 1:23 PM ?

## 2021-05-26 NOTE — Anesthesia Preprocedure Evaluation (Addendum)
Anesthesia Evaluation  ?Patient identified by MRN, date of birth, ID band ?Patient awake ? ? ? ?Reviewed: ?Allergy & Precautions, NPO status , Patient's Chart, lab work & pertinent test results ? ?Airway ?Mallampati: II ? ?TM Distance: >3 FB ?Neck ROM: Full ? ? ? Dental ? ?(+) Dental Advisory Given ?  ?Pulmonary ?COPD,  COPD inhaler and oxygen dependent, former smoker,  ?RUL mass and loculated pleural effusion.  ?  ?breath sounds clear to auscultation ? ? ? ? ? ? Cardiovascular ?hypertension, Pt. on medications ?+ CAD and +CHF  ?+ dysrhythmias Atrial Fibrillation + pacemaker  ?Rhythm:Regular Rate:Normal ? ? ?  ?Neuro/Psych ?TIA  ? GI/Hepatic ?Neg liver ROS, GERD  ,Metastatic colon CA ?  ?Endo/Other  ?negative endocrine ROS ? Renal/GU ?Renal disease  ? ?  ?Musculoskeletal ? ?(+) Arthritis , Fibromyalgia - ? Abdominal ?  ?Peds ? Hematology ? ?(+) Blood dyscrasia, anemia ,   ?Anesthesia Other Findings ? ? Reproductive/Obstetrics ? ?  ? ? ? ? ? ? ? ? ? ? ? ? ? ?  ?  ? ? ? ? ? ? ? ?Anesthesia Physical ?Anesthesia Plan ? ?ASA: 4 ? ?Anesthesia Plan: General  ? ?Post-op Pain Management: Minimal or no pain anticipated  ? ?Induction: Intravenous ? ?PONV Risk Score and Plan: 3 and Dexamethasone, Ondansetron and Treatment may vary due to age or medical condition ? ?Airway Management Planned: Oral ETT ? ?Additional Equipment:  ? ?Intra-op Plan:  ? ?Post-operative Plan: Extubation in OR and Possible Post-op intubation/ventilation ? ?Informed Consent: I have reviewed the patients History and Physical, chart, labs and discussed the procedure including the risks, benefits and alternatives for the proposed anesthesia with the patient or authorized representative who has indicated his/her understanding and acceptance.  ? ? ? ?Dental advisory given ? ?Plan Discussed with: CRNA ? ?Anesthesia Plan Comments:   ? ? ? ? ? ? ?Anesthesia Quick Evaluation ? ?

## 2021-05-26 NOTE — Interval H&P Note (Signed)
History and Physical Interval Note: ? ?05/19/2021 ?2:41 PM ? ?[name]  has presented today for surgery, with the diagnosis of lung disease.  The various methods of treatment have been discussed with the patient and family. After consideration of risks, benefits and other options for treatment, the patient has consented to  Procedure(s): ?ENDOBRONCHIAL ULTRASOUND (Bilateral) as a surgical intervention.  The patient's history has been reviewed, patient examined, no change in status, stable for surgery.  I have reviewed the patient's chart and labs.  Questions were answered to the patient's satisfaction.   ? ? ?[name] [name] ? ? ?

## 2021-05-26 NOTE — Op Note (Signed)
Video Bronchoscopy with Endobronchial Ultrasound Procedure Note ? ?Date of Operation: 05/29/2021 ? ?Pre-op Diagnosis: Mediastinal adenopathy ? ?Post-op Diagnosis: Mediastinal adenopathy ? ?Surgeon: [name], DO ? ?Assistants: None ? ?Anesthesia: General endotracheal anesthesia ? ?Operation: Flexible video fiberoptic bronchoscopy with endobronchial ultrasound and biopsies. ? ?Estimated Blood Loss: Minimal ? ?Complications: None ? ?Indications and History: ?[name] is a 76 y.o. female with history of colon cancer now with mediastinal adenopathy and a loculated anterior right-sided pleural effusion.  The risks, benefits, complications, treatment options and expected outcomes were discussed with the patient.  The possibilities of pneumothorax, pneumonia, reaction to medication, pulmonary aspiration, perforation of a viscus, bleeding, failure to diagnose a condition and creating a complication requiring transfusion or operation were discussed with the patient who freely signed the consent.   ? ?Description of Procedure: ?The patient was examined in the preoperative area and history and data from the preprocedure consultation were reviewed. It was deemed appropriate to proceed.  The patient was taken to [facility] long endoscopy room to, identified as [name] and the procedure verified as Flexible Video Fiberoptic Bronchoscopy.  A Time Out was held and the above information confirmed. After being taken to the operating room general anesthesia was initiated and the patient  was orally intubated. The video fiberoptic bronchoscope was introduced via the endotracheal tube and a general inspection was performed which showed normal-appearing right and left lung appears normal with no evidence of endobronchial lesion. The standard scope was then withdrawn and the endobronchial ultrasound was used to identify and characterize the peritracheal, hilar and bronchial lymph nodes. Inspection showed ultrasound  revealed small node within the right hilum 11 R as well as a large subcarinal station 7 node.. Using real-time ultrasound guidance Wang needle biopsies were take from Station 7 nodes and were sent for cytology. The patient tolerated the procedure well without apparent complications. There was no significant blood loss. The bronchoscope was withdrawn. Anesthesia was reversed and the patient was taken to the PACU for recovery.  ? ?Samples: ?1. Wang needle biopsies from Station 7 node ? ?Plans:  ?The patient will be discharged from the PACU to home when recovered from anesthesia. We will review the cytology, pathology results with the patient when they become available. Outpatient followup will be with Medical Oncology.  ? ?[name], DO ?[facility] ?06/03/2021 3:58 PM   ?

## 2021-05-26 NOTE — Discharge Instructions (Signed)
Flexible Bronchoscopy, Care After ?This sheet gives you information about how to care for yourself after your test. Your doctor may also give you more specific instructions. If you have problems or questions, contact your doctor. ?Follow these instructions at home: ?Eating and drinking ?Do not eat or drink anything (not even water) for 2 hours after your test, or until your numbing medicine (local anesthetic) wears off. ?When your numbness is gone and your cough and gag reflexes have come back, you may: ?Eat only soft foods. ?Slowly drink liquids. ?The day after the test, go back to your normal diet. ?Driving ?Do not drive for 24 hours if you were given a medicine to help you relax (sedative). ?Do not drive or use heavy machinery while taking prescription pain medicine. ?General instructions ? ?Take over-the-counter and prescription medicines only as told by your doctor. ?Return to your normal activities as told. Ask what activities are safe for you. ?Do not use any products that have nicotine or tobacco in them. This includes cigarettes and e-cigarettes. If you need help quitting, ask your doctor. ?Keep all follow-up visits as told by your doctor. This is important. It is very important if you had a tissue sample (biopsy) taken. ?Get help right away if: ?You have shortness of breath that gets worse. ?You get light-headed. ?You feel like you are going to pass out (faint). ?You have chest pain. ?You cough up: ?More than a little blood. ?More blood than before. ?Summary ?Do not eat or drink anything (not even water) for 2 hours after your test, or until your numbing medicine wears off. ?Do not use cigarettes. Do not use e-cigarettes. ?Get help right away if you have chest pain. ? ?This information is not intended to replace advice given to you by your health care provider. Make sure you discuss any questions you have with your health care provider. ?Document Released: 12/31/2008 Document Revised: 02/15/2017 Document  Reviewed: 03/23/2016 ?Elsevier Patient Education ? [place]. ? ?

## 2021-05-26 NOTE — TOC Progression Note (Addendum)
Transition of Care (TOC) - Progression Note  ? ? ?Patient Details  ?Name: [name] ?MRN: [id number] ?Date of Birth: [birth year] ? ?Transition of Care (TOC) CM/SW Contact  ?Kaitlen Redford, Marjie Skiff, RN ?Phone Number: ?05/22/2021, 11:55 AM ? ?Clinical Narrative:    ?Spoke with HTA RN to [employer] auth for SNF at this time as pt is not medically ready for dc. TOC will follow and restart auth once pt closer to being medically ready. Plan is for [facility] at [facility]. ? ? ?Expected Discharge Plan: [place] ?Barriers to Discharge: Continued Medical Work up ? ?Expected Discharge Plan and Services ?Expected Discharge Plan: [place] ?  ?Discharge Planning Services: CM Consult ?  ?Living arrangements for the past 2 months: [place] ?                ?  ?  ?Social Determinants of Health (SDOH) Interventions ?  ? ?Readmission Risk Interventions ?No flowsheet data found. ? ?

## 2021-05-27 ENCOUNTER — Inpatient Hospital Stay (HOSPITAL_COMMUNITY): Payer: HMO

## 2021-05-27 DIAGNOSIS — R918 Other nonspecific abnormal finding of lung field: Secondary | ICD-10-CM | POA: Diagnosis not present

## 2021-05-27 DIAGNOSIS — J9601 Acute respiratory failure with hypoxia: Secondary | ICD-10-CM

## 2021-05-27 DIAGNOSIS — J9611 Chronic respiratory failure with hypoxia: Secondary | ICD-10-CM | POA: Diagnosis not present

## 2021-05-27 DIAGNOSIS — J9620 Acute and chronic respiratory failure, unspecified whether with hypoxia or hypercapnia: Secondary | ICD-10-CM | POA: Diagnosis not present

## 2021-05-27 DIAGNOSIS — J189 Pneumonia, unspecified organism: Secondary | ICD-10-CM | POA: Diagnosis not present

## 2021-05-27 DIAGNOSIS — D638 Anemia in other chronic diseases classified elsewhere: Secondary | ICD-10-CM | POA: Diagnosis not present

## 2021-05-27 DIAGNOSIS — C189 Malignant neoplasm of colon, unspecified: Secondary | ICD-10-CM | POA: Diagnosis not present

## 2021-05-27 DIAGNOSIS — J470 Bronchiectasis with acute lower respiratory infection: Secondary | ICD-10-CM | POA: Diagnosis not present

## 2021-05-27 LAB — CBC
HCT: 27.3 % — ABNORMAL LOW (ref 36.0–46.0)
HCT: 29.4 % — ABNORMAL LOW (ref 36.0–46.0)
Hemoglobin: 8.5 g/dL — ABNORMAL LOW (ref 12.0–15.0)
Hemoglobin: 9.4 g/dL — ABNORMAL LOW (ref 12.0–15.0)
MCH: 28.9 pg (ref 26.0–34.0)
MCH: 29.2 pg (ref 26.0–34.0)
MCHC: 31.1 g/dL (ref 30.0–36.0)
MCHC: 32 g/dL (ref 30.0–36.0)
MCV: 92.9 fL (ref 80.0–100.0)
MCV: 91.3 fL (ref 80.0–100.0)
Platelets: 188 10*3/uL (ref 150–400)
Platelets: 150 10*3/uL (ref 150–400)
RBC: 2.94 MIL/uL — ABNORMAL LOW (ref 3.87–5.11)
RBC: 3.22 MIL/uL — ABNORMAL LOW (ref 3.87–5.11)
RDW: 22.5 % — ABNORMAL HIGH (ref 11.5–15.5)
RDW: 20.1 % — ABNORMAL HIGH (ref 11.5–15.5)
WBC: 17.8 10*3/uL — ABNORMAL HIGH (ref 4.0–10.5)
WBC: 14.2 10*3/uL — ABNORMAL HIGH (ref 4.0–10.5)
nRBC: 1.7 % — ABNORMAL HIGH (ref 0.0–0.2)
nRBC: 2.1 % — ABNORMAL HIGH (ref 0.0–0.2)

## 2021-05-27 LAB — HEPATIC FUNCTION PANEL
ALT: 14 U/L (ref 0–44)
AST: 38 U/L (ref 15–41)
Albumin: 2.9 g/dL — ABNORMAL LOW (ref 3.5–5.0)
Alkaline Phosphatase: 96 U/L (ref 38–126)
Bilirubin, Direct: 0.4 mg/dL — ABNORMAL HIGH (ref 0.0–0.2)
Indirect Bilirubin: 1 mg/dL — ABNORMAL HIGH (ref 0.3–0.9)
Total Bilirubin: 1.4 mg/dL — ABNORMAL HIGH (ref 0.3–1.2)
Total Protein: 6.2 g/dL — ABNORMAL LOW (ref 6.5–8.1)

## 2021-05-27 LAB — BLOOD GAS, VENOUS
Acid-Base Excess: 2.3 mmol/L — ABNORMAL HIGH (ref 0.0–2.0)
Bicarbonate: 27.3 mmol/L (ref 20.0–28.0)
O2 Saturation: 99.9 %
Patient temperature: 36.4
pCO2, Ven: 42 mmHg — ABNORMAL LOW (ref 44–60)
pH, Ven: 7.42 (ref 7.25–7.43)
pO2, Ven: 148 mmHg — ABNORMAL HIGH (ref 32–45)

## 2021-05-27 LAB — BASIC METABOLIC PANEL
Anion gap: 12 (ref 5–15)
Anion gap: 8 (ref 5–15)
BUN: 34 mg/dL — ABNORMAL HIGH (ref 8–23)
BUN: 42 mg/dL — ABNORMAL HIGH (ref 8–23)
CO2: 24 mmol/L (ref 22–32)
CO2: 27 mmol/L (ref 22–32)
Calcium: 9.5 mg/dL (ref 8.9–10.3)
Calcium: 8.9 mg/dL (ref 8.9–10.3)
Chloride: 101 mmol/L (ref 98–111)
Chloride: 102 mmol/L (ref 98–111)
Creatinine, Ser: 0.77 mg/dL (ref 0.44–1.00)
Creatinine, Ser: 0.67 mg/dL (ref 0.44–1.00)
GFR, Estimated: >60 mL/min (ref >60)
GFR, Estimated: >60 mL/min (ref >60)
Glucose, Bld: 158 mg/dL — ABNORMAL HIGH (ref 70–99)
Glucose, Bld: 129 mg/dL — ABNORMAL HIGH (ref 70–99)
Potassium: 4.5 mmol/L (ref 3.5–5.1)
Potassium: 3.2 mmol/L — ABNORMAL LOW (ref 3.5–5.1)
Sodium: 137 mmol/L (ref 135–145)
Sodium: 137 mmol/L (ref 135–145)

## 2021-05-27 LAB — PROCALCITONIN: Procalcitonin: 0.51 ng/mL

## 2021-05-27 LAB — LACTIC ACID, PLASMA: Lactic Acid, Venous: 2.6 mmol/L (ref 0.5–1.9)

## 2021-05-27 LAB — PROTIME-INR
INR: 1.5 — ABNORMAL HIGH (ref 0.8–1.2)
Prothrombin Time: 17.8 seconds — ABNORMAL HIGH (ref 11.4–15.2)

## 2021-05-27 LAB — PHOSPHORUS: Phosphorus: 4.6 mg/dL (ref 2.5–4.6)

## 2021-05-27 LAB — CEA: CEA: 381 ng/mL — ABNORMAL HIGH (ref 0.0–4.7)

## 2021-05-27 LAB — PREPARE RBC (CROSSMATCH)

## 2021-05-27 LAB — MAGNESIUM: Magnesium: 2 mg/dL (ref 1.7–2.4)

## 2021-05-27 MED ORDER — HEPARIN (PORCINE) 25000 UT/250ML-% IV SOLN
500.0000 [IU]/h | INTRAVENOUS | Status: DC
  Administered 2021-05-27: 500 [IU]/h via INTRAVENOUS
  Filled 2021-05-27: qty 250

## 2021-05-27 MED ORDER — ACETAMINOPHEN 500 MG PO TABS
1000.0000 mg | ORAL_TABLET | Freq: Three times a day (TID) | ORAL | Status: DC
  Administered 2021-05-27 – 2021-05-29 (×5): 1000 mg via ORAL
  Filled 2021-05-27 (×5): qty 2

## 2021-05-27 MED ORDER — SODIUM CHLORIDE 0.9 % IV SOLN
2.0000 g | Freq: Two times a day (BID) | INTRAVENOUS | Status: DC
  Administered 2021-05-28 – 2021-05-30 (×5): 2 g via INTRAVENOUS
  Filled 2021-05-27 (×6): qty 2

## 2021-05-27 MED ORDER — FUROSEMIDE 10 MG/ML IJ SOLN
20.0000 mg | Freq: Once | INTRAMUSCULAR | Status: AC
  Administered 2021-05-27: 20 mg via INTRAVENOUS
  Filled 2021-05-27: qty 2

## 2021-05-27 MED ORDER — VANCOMYCIN HCL 750 MG/150ML IV SOLN: 750.0000 mg | INTRAVENOUS | Status: DC

## 2021-05-27 MED ORDER — SODIUM CHLORIDE 0.9% IV SOLUTION: Freq: Once | INTRAVENOUS | Status: AC

## 2021-05-27 MED ORDER — METRONIDAZOLE 500 MG/100ML IV SOLN
500.0000 mg | Freq: Two times a day (BID) | INTRAVENOUS | Status: DC
  Administered 2021-05-27 – 2021-05-28 (×4): 500 mg via INTRAVENOUS
  Filled 2021-05-27 (×4): qty 100

## 2021-05-27 MED ORDER — ASPIRIN 325 MG PO TABS
325.0000 mg | ORAL_TABLET | Freq: Once | ORAL | Status: AC
  Administered 2021-05-27: 325 mg via ORAL
  Filled 2021-05-27: qty 1

## 2021-05-27 MED ORDER — VANCOMYCIN HCL IN DEXTROSE 1-5 GM/200ML-% IV SOLN
1000.0000 mg | Freq: Once | INTRAVENOUS | Status: AC
  Administered 2021-05-27: 1000 mg via INTRAVENOUS
  Filled 2021-05-27: qty 200

## 2021-05-27 MED ORDER — VANCOMYCIN HCL 750 MG/150ML IV SOLN
750.0000 mg | INTRAVENOUS | Status: DC
Start: 2021-05-29 — End: 2021-05-29

## 2021-05-27 MED ORDER — OXYCODONE HCL 5 MG PO TABS
12.5000 mg | ORAL_TABLET | ORAL | Status: DC | PRN
  Administered 2021-05-27 – 2021-05-29 (×7): 12.5 mg via ORAL
  Filled 2021-05-27 (×7): qty 3

## 2021-05-27 MED ORDER — OXYCODONE HCL 5 MG PO TABS
2.5000 mg | ORAL_TABLET | Freq: Once | ORAL | Status: AC
  Administered 2021-05-27: 2.5 mg via ORAL
  Filled 2021-05-27: qty 1

## 2021-05-27 MED ORDER — SODIUM CHLORIDE 0.9 % IV SOLN
2.0000 g | Freq: Once | INTRAVENOUS | Status: AC
  Administered 2021-05-27: 2 g via INTRAVENOUS
  Filled 2021-05-27 (×2): qty 2

## 2021-05-27 MED ORDER — SODIUM CHLORIDE 0.9 % IV SOLN
510.0000 mg | Freq: Once | INTRAVENOUS | Status: AC
  Administered 2021-05-27: 510 mg via INTRAVENOUS
  Filled 2021-05-27: qty 17

## 2021-05-27 MED ORDER — FUROSEMIDE 10 MG/ML IJ SOLN: 20.0000 mg | Freq: Once | INTRAMUSCULAR | Status: DC

## 2021-05-27 MED ORDER — LORAZEPAM 0.5 MG PO TABS
0.5000 mg | ORAL_TABLET | Freq: Four times a day (QID) | ORAL | Status: DC | PRN
  Administered 2021-05-27: 0.5 mg via ORAL
  Filled 2021-05-27: qty 1

## 2021-05-27 NOTE — Progress Notes (Signed)
ANTICOAGULATION CONSULT NOTE - Initial Consult ? ?Pharmacy Consult for heparin ?Indication: chest pain/ACS ? ?Allergies  ?Allergen Reactions  ? Dabigatran Etexilate Mesylate Other (See Comments)  ?  INTERNAL BLEEDING- Pradaxa  ? Augmentin [Amoxicillin-Pot Clavulanate] Diarrhea and Nausea Only  ?  Did it involve swelling of the face/tongue/throat, SOB, or low BP? No ?Did it involve sudden or severe rash/hives, skin peeling, or any reaction on the inside of your mouth or nose? No ?Did you need to seek medical attention at a hospital or doctor's office? No ?When did it last happen? 02/2019 ?If all above answers are "NO", may proceed with cephalosporin use. ?  ? Talwin [Pentazocine] Other (See Comments)  ?  Hallucinations ?  ? ? ?Patient Measurements: ?Height: 5' (152.4 cm) ?Weight: 41.9 kg (92 lb 6 oz) ?IBW/kg (Calculated) : 45.5 ?Heparin Dosing Weight: 41.9kg ? ?Vital Signs: ?Temp: 98.3 ?F (36.8 ?C) (03/11 2006) ?Temp Source: Oral (03/11 2006) ?BP: 115/83 (03/11 2021) ?Pulse Rate: 105 (03/11 2021) ? ?Labs: ?Recent Labs  ?  06/02/2021 ?6301 05/27/21 ?6010 05/27/21 ?1720  ?HGB 8.4* 8.5* 9.4*  ?HCT 25.9* 27.3* 29.4*  ?PLT 122* 188 150  ?LABPROT  --   --  17.8*  ?INR  --   --  1.5*  ?CREATININE 0.57 0.77 0.67  ?TROPONINIHS  --   --  1,182*  ? ? ?Estimated Creatinine Clearance: 39.6 mL/min (by C-G formula based on SCr of 0.67 mg/dL). ? ? ?Medical History: ?Past Medical History:  ?Diagnosis Date  ? Anemia   ? Arthritis   ? Atrial fibrillation ([place])   ? CAD (coronary artery disease)   ? Mild disease per cath in 2011  ? COPD (chronic obstructive pulmonary disease) ([place])   ? Home O2  ? Diverticulitis   ? Elbow fracture, left aug 2012  ? Erythropoietin deficiency anemia 06/19/2018  ? Essential hypertension   ? Fibromyalgia   ? Gastroparesis   ? GERD (gastroesophageal reflux disease)   ? H/O: GI bleed   ? from Pradaxa  ? Hepatitis   ? Hx of in high school   ? History of pneumonia   ? Recurrent  ? Hx MRSA infection   ? Hx of gastric  ulcer   ? Hx of stress fx 10/2009  ? Right hip   ? Iron malabsorption 08/14/2017  ? LBBB (left bundle branch block)   ? Oxygen dependent   ? 2 liters via nasal cannula at all times  ? Pacemaker   ? CRT therapy; followed by Dr. Caryl Comes  ? Pleural effusion   ?  s/p right thoracentesis 03/09  ? Primary dilated cardiomyopathy ([place])   ? EF 45 to 50% per echo in Jan 2012  ? RLS (restless legs syndrome)   ? Silent aspiration   ? Small bowel obstruction ([place])   ? Urinary incontinence   ? Venous embolism and thrombosis of subclavian vein ([facility])   ? After pacemaker insertion Oct 2011  ? Vitamin B12 deficiency   ? Wears glasses   ? Reading  ? ? ? ? ?Assessment: ?76 yo female with permAF on eliquis, copd, chronic respiratory failure presented with weakness and back pain.  Found to have FOBT and melena in the ER.  Now troponins are elevated, pharmacy consulted to dose heparin. LD of eliquis on 2/27 ? ?Sign events: ?3/2: Admitted, started on PPI given melena, transfused 2 units; given antibiotics given multifocal PNA on CXR ?3/3: GI consulted for melena; CT chest showed RUL mass, loculated effusion and  lytic lesions scattered in bone --> Pulmonology consulted ?3/4: EGD significant for gastritis only  ?3/7: Colonoscopy showed a subtle mucosal irregularity at her anastomosis, biopsied, but no definite source of bleeding ?3/8: Biopsy from colonoscopy resulted adenoCA ?3/9: Gen Surg, Palliative Care and Oncology consulted ?3/10: Bronchoscopic biopsy of mediastinal lymph node by Pulmonology ?3/11: More tachycardia overnight to 120s, 130s, very weak today ? ?aPTT 17.8, INR 1.5, Hgb 9.4, plts WNL, Trop 1182 ? ? ? ?Goal of Therapy:  ?Heparin level 0.3-0.7 units/ml ?Monitor platelets by anticoagulation protocol: Yes ?  ?Plan:  ?No bolus due to low Hgb  ?Start heparin drip at 500 units/hr ?Heparin level in 8 hours ?Daily CBC ? ?Dolly Rias RPh ?05/27/2021, 9:48 PM ? ? ?

## 2021-05-27 NOTE — Progress Notes (Signed)
Dr. Chase Caller was bedside and advised of new labs placed. Advised they were not pulled due to current transfusion and that patient has another on scheduled afterwards. He advised he would cancel the second blood transfusion, as it may overload the patient. Talked to lab as well. I will pull labs 2 hrs post blood transfusion. Lab attempted to get cultures this morning and were unsuccessful, they will try again today. ?

## 2021-05-27 NOTE — Consult Note (Addendum)
NAME:  [name], MRN:  [id number], DOB:  09/13/1945, LOS: 8 ADMISSION DATE:  05/22/2021, CONSULTATION DATE:  05/19/21 REFERRING MD:  [facility], CHIEF COMPLAINT:  MAI/ bronchiectasis  with new effusion, lung nodule   BRIEF  75 yowf quit smoking 8 y PTA with melena and followed by [facility] for MAI with last rx June 2022 tol poorly and since then general state of decline, uses w/c to go anyhere outside the house with rattling cough productive of white mucus and doe x more than room to room though also limited by weakness.   Denies dysphagia or cp fever chills or NS   Pertinent  Medical History  Anemia  Arthritis, Atrial fibrillation  CAD  COPD\  Diverticulitis  Erythropoietin deficiency anemia   Essential hypertension Fibromyalgia Gastroparesis GERD Hepatitis History of pneumonia MRSA infection,  gastric ulcer Iron malabsorption   LBBB (left bundle branch block)  Oxygen dependent Pacemaker Pleural effusion,  Primary dilated cardiomyopathy  Silent aspiration Small bowel obstruction Urinary incontinence Venous embolism and thrombosis of subclavian vein (  Vitamin B12 deficiency   EVENTS  3/2: Admitted, started on PPI given melena, transfused 2 units; given antibiotics given multifocal PNA on CXR 05/19/21 - Lying quietly in bed on 4lpm NP nad cc dry mouth as npo for endoscopy 3/3: GI consulted for melena; CT chest showed RUL mass, loculated effusion and lytic lesions scattered in bone --> Pulmonology consulted 3/4: EGD significant for gastritis only  3/7: Colonoscopy showed a subtle mucosal irregularity at her anastomosis, biopsied, but no definite source of bleeding 3/8: Biopsy from colonoscopy resulted adenoCA 3/9: Gen Surg, Palliative Care and Oncology consulted  - [facility] consideirng IR consult for RUL mass bx due to RUL mass with Lytic bone lesions   3/10 - 3/.10/23 - [facility] recalling PCCM. [name] Has pulmonary findings that needs ddifferentiating of infection v mets to  lung. There is IR consult pending for the lytic bone lesion. She has failure to thrive and is ECOG 04. Is on 4L [place] oper [facility]. PEr family husband and patoient -> on o2 x 2 years at 4L [place]. Not driven in 6 weeks. ECOG 3-4 x 4 weeks and bed bouned 2 weeks prior to admit and failure to thrive and weight loss and deconditioned  -Multidisciplinary goals of care: Full code  -Right anterior chest wall ultrasound-guided thoracentesis by Dr. Valeta Harms: 300 cc of clear yellow fluid  -Endobronchial ultrasound fine-needle biopsy station 7 node: Positive for adenocarcinoma.  Stains requested.  -PET scan ordered and pending Interim History / Subjective:   05/27/2021: Hemoglobin 8.5 g%.  Blood transfusion ongoing.  Continues on 4 L nasal cannula.  Hospitalist worried that patient is looking more frail and deconditioned and not looking well.  Husband at the bedside.  Afebrile and labs look okay so far.  Oncology concerned about her performance status.  Immunotherapy will be the only option depending on molecular results.  Objective   Blood pressure 101/86, pulse 99, temperature 97.7 F (36.5 C), temperature source Axillary, resp. rate 15, height 5' (1.524 m), weight 41.9 kg, SpO2 99 %.    FiO2 (%):  [36 %] 36 %   Intake/Output Summary (Last 24 hours) at 05/27/2021 1200 Last data filed at 05/27/2021 0500 Gross per 24 hour  Intake 600 ml  Output 600 ml  Net 0 ml    Estimated body mass index is 18.04 kg/m as calculated from the following:   Height as of this encounter: 5' (1.524 m).   Weight as  of this encounter: 41.9 kg.  Filed Weights   05/22/21 0439 06/12/2021 0635 05/24/21 0540  Weight: 41.6 kg 40 kg 41.9 kg    Examination: Extremely frail lady look lying in bed.  Oxygen on.  No distress but looks more deconditioned and more frail than even yesterday.  Her voice tone is much less today.  Blood transfusion going.  Right-sided Port-A-Cath.  Clear to auscultation bilaterally abdomen soft.  Limbs are  intact.  Ct chest with contrast 06/16/2021  - personally visualized.  Interval development since April 2022 of masslike consolidation within the a right upper lobe in an area of more focal consolidation noted on prior examination suspicious for primary bronchogenic malignancy. Associated shotty right pre-vascular, and periaortic adenopathy as well as necrotic right hilar adenopathy. Interval development of numerous lytic lesions throughout the visualized axial skeleton. PET CT examination may be helpful for further evaluation.   Superimposed changes of a chronic atypical infection or infectious bronchiolitis with bronchiolectasis, airway impaction, and peripheral subpleural consolidation reticulation grossly stable since prior examination.   Interval development of partially loculated right pleural effusion. Diagnostic thoracentesis may be helpful for further management.   Extensive multi-vessel coronary artery calcification. Morphologic changes in keeping with pulmonary arterial hypertension.   Aortic Atherosclerosis (ICD10-I70.0).       Assessment & Plan:  #Background prior to admit and present on admit  - Failure to Thrive with unintentioal weight loss - Cachexia - BMI 18  - Severe protein calorie malnurition - alb 2.9 at admit  - Chronic respirartory fialrue - 4L [place]  - Bed bound eCOG 4 with severe physical deconditioning. - Hx of sacral deub prior to admit - eschar unstageable per RN - Hx of smokng   #Present on admit  - Colon Cancer  - admission problemn - Lytic bone mets - ? Due to above  - At admission also   -- Hilar node - necrotic -status post right needle biopsy 06/08/2021: Positive for cancer  - RUL consolidation with mass and loculated subpleural fluid cllection anteriorky   -Status post 300 cc thoracentesis.?  Transudate.  Results for cytology pending.  05/21/2021: Physicality and physical deconditioning and functional status seems to have deteriorated.  Although  mentally alert.  I am worried that she is in terminal decline.  CODE STATUS remains full  Plan  -Okay for 1 unit blood transfusion [we will discontinue the second unit of blood -no evidence of benefit of 2 units of transfusion for hemoglobin less than 7-8 g%] -Check repeat CBC, troponin, lactic acid, procalcitonin, chemistry, lactic acid to assess if there is any incipient sepsis -Agree with antibiotic change by hospitalist  Goals of care: I did explain to the husband that even if the tumors are treatable by immunotherapy her functional status is declining on top of her already very poor functional status.  She does not even have basic strenghth to go to inpatient rehab.  Did indicate to him that in my clinical experience that I would not be surprised if her deterioration is so rapid that she does not even make it to immunotherapy.  He did indicate to me if that terminal prognosis is certain that he would like to take her home and given her extensive home support.  He is more  understanding of her current condition and decline.     Best Practice (right click and "Reselect all SmartList Selections" daily)  Per [facility]  D./w  [facility] MD Dr Loleta Books, and husband: Previsit labs and CODE STATUS today  and goals of care   LABS    PULMONARY Recent Labs  Lab 05/17/2021 1253  HCO3 29.1*  O2SAT 55.2    CBC Recent Labs  Lab 05/25/21 0359 05/17/2021 0539 05/27/21 0529  HGB 8.8* 8.4* 8.5*  HCT 27.5* 25.9* 27.3*  WBC 17.2* 15.5* 17.8*  PLT 132* 122* 188    COAGULATION No results for input(s): INR in the last 168 hours.  CARDIAC  No results for input(s): TROPONINI in the last 168 hours. No results for input(s): PROBNP in the last 168 hours.   CHEMISTRY Recent Labs  Lab 05/21/21 1054 05/22/21 0515 05/25/21 0359 05/28/2021 0539 05/27/21 0529  NA 132* 132* 136 135 137  K 3.7 3.8 3.8 3.8 4.5  CL 93* 94* 103 99 101  CO2 30 31 27 28 24   GLUCOSE 155* 106* 110* 109* 158*  BUN 28* 29* 27*  26* 34*  CREATININE 0.50 0.47 0.54 0.57 0.77  CALCIUM 9.2 8.8* 8.9 9.3 9.5   Estimated Creatinine Clearance: 39.6 mL/min (by C-G formula based on SCr of 0.77 mg/dL).   LIVER Recent Labs  Lab 05/31/2021 2047  PROT 6.5     INFECTIOUS No results for input(s): LATICACIDVEN, PROCALCITON in the last 168 hours.   ENDOCRINE CBG (last 3)  No results for input(s): GLUCAP in the last 72 hours.       IMAGING x48h  - image(s) personally visualized  -   highlighted in bold CT ABDOMEN PELVIS W CONTRAST  Result Date: 05/25/2021 CLINICAL DATA:  Colon carcinoma.  Staging. EXAM: CT ABDOMEN AND PELVIS WITH CONTRAST TECHNIQUE: Multidetector CT imaging of the abdomen and pelvis was performed using the standard protocol following bolus administration of intravenous contrast. RADIATION DOSE REDUCTION: This exam was performed according to the departmental dose-optimization program which includes automated exposure control, adjustment of the mA and/or kV according to patient size and/or use of iterative reconstruction technique. CONTRAST:  15mL OMNIPAQUE IOHEXOL 300 MG/ML  SOLN COMPARISON:  06/21/2017 FINDINGS: Lower Chest: Small bilateral pleural effusions, mildly increased in size since previous study. Right middle lobe atelectasis. Hepatobiliary: No hepatic masses identified. Prior cholecystectomy. No evidence of biliary obstruction. Pancreas:  No mass or inflammatory changes. Spleen: Within normal limits in size and appearance. Adrenals/Urinary Tract: No masses identified. Moderate diffuse left renal atrophy. Several tiny sub-cm left renal cyst also noted. No evidence of ureteral calculi or hydronephrosis. Stomach/Bowel: Status post subtotal colectomy. No soft tissue masses identified. No evidence of obstruction, inflammatory process or abnormal fluid collections. Vascular/Lymphatic: No pathologically enlarged lymph nodes. No acute vascular findings. Aortic atherosclerotic calcification noted. Reproductive:  Prior hysterectomy noted. Adnexal regions are unremarkable in appearance. Other:  None. Musculoskeletal: Lytic bone metastases are seen throughout the spine and pelvis which are new since previous study. Neurostimulator device seen in the left buttock with leads extending through bilateral sacral foramina. Right hip prosthesis also noted. IMPRESSION: No evidence of soft tissue metastatic disease within the abdomen or pelvis. Lytic bone metastases throughout the spine and pelvis. Small bilateral pleural effusions. Aortic Atherosclerosis (ICD10-I70.0). Electronically Signed   By: Marlaine Hind M.D.   On: 05/25/2021 12:44   DG CHEST PORT 1 VIEW  Result Date: 06/10/2021 CLINICAL DATA:  Status post thoracentesis EXAM: PORTABLE CHEST 1 VIEW COMPARISON:  Previous studies including the examination of 06/03/2021 FINDINGS: Cardiac size is within normal limits. Biventricular pacer leads are noted in place. Tip of right IJ chest port is seen at the junction of superior vena cava and right atrium. Increased interstitial  markings are seen in the left upper lung fields, right upper right mid and right lower lung fields. There is blunting of right lateral CP angle. There is questionable minimal blunting of left lateral CP angle. Asymmetric pleural density is seen in the right apex with no significant change. There is no pneumothorax. IMPRESSION: Increased interstitial markings are seen in both lungs, more so on the right side which may suggest underlying scarring and possibly superimposed pneumonia. There is interval decrease in patchy infiltrates in the right upper lung fields. There is small right pleural effusion. Possible minimal left pleural effusion. Electronically Signed   By: Elmer Picker M.D.   On: 05/21/2021 16:19

## 2021-05-27 NOTE — Progress Notes (Signed)
She had a very busy day yesterday.  She underwent bronchoscopy.  She had a thoracentesis.  I would not expect any results back for several days. ? ?Her hemoglobin is 8.5.  I really think she is going need to be transfused given her overall lung status.  Her hemoglobin is 8.5.  Platelet count 188,000.  White cell count 17.8. ? ?I talked her about the transfusion.  She is in agreement. ? ?I think that she will need to have an iron transfusion.  I realize that her ferritin is quite elevated but this is inflammatory.  Her iron saturation is only 18%. ? ?She is quite tired this morning.  Again she had a busy day yesterday. ? ?I think the pathology will be critical.  Hopefully, we will have material for diagnosis and then some for molecular studies.  If there is not enough material for molecular studies, then we will have to see if we can get blood work done to try to get the molecular markers. ? ?I see that a PET scan has been ordered.  This cannot be done in the hospital.  A bone scan can be done in the hospital.  We will see about getting 1 set up for if possible on Monday. ? ?Her vital signs show temperature of 97.3.  Pulse 120.  Blood pressure 91/56. ? ?She does not complain too much in the way of pain. ? ?For right now, I think a blood transfusion will definitely help her out.  We will see about giving her 2 units of blood.  I think this would make her breathing a little bit easier and give her a little bit more energy. ? ?I know I will be quite challenging to treat her.  I would think that the only way that we can potentially treat her malignancy would be immunotherapy or targeted therapy.  I just do not think she would be a great candidate for systemic chemotherapy because of her overall performance status. ? ?Again I would not expect a pathology report to be back until Tuesday at the earliest. ? ?I do appreciate everybody's help with her. ? ?Criss Rosales, MD ? ?[place] 11:28 ?

## 2021-05-27 NOTE — Progress Notes (Signed)
Palliative care brief note ? ?Attempted to check in on [name] today.  She was having procedure at the time I stopped by. ? ?Chart review notes goals of care meeting earlier today with family and desires for proceeding with work-up of lung lesion and then discussion with Dr. Marin Olp about next steps for potential treatment depending upon results of tissue diagnosis. ? ?Palliative will continue to follow peripherally while awaiting results of the studies as [name] had indicated to me yesterday that she wants to know exactly what she is dealing with and her treatment options prior to making any decisions about her long-term goals of care. ? ?Please call if we can be of further assistance in the interim. ? ?Micheline Rough, MD ?[name] ?[phone] ? ?NO CHARGE NOTE ?

## 2021-05-27 NOTE — Progress Notes (Signed)
?Progress Note ? ? ?Patient: [name] UVO:[id number] DOB: 10-14-1945 DOA: 05/25/2021     8 ?DOS: the patient was seen and examined on 05/27/2021 ?  ? ? ? ? ?Brief hospital course: ?[name] is a 76 y.o. F with permAF on Eliquis, COPD/bronchiectasis/MAI and chronic respiratory failure on 4L at home, CAD, hx PPM, chronic pain and dCHF who presented with weakness and back pain.  Also found incidentally to report melena and have FOBT in the ER.   ? ? ?3/2: Admitted, started on PPI given melena, transfused 2 units; given antibiotics given multifocal PNA on CXR ?3/3: GI consulted for melena; CT chest showed RUL mass, loculated effusion and lytic lesions scattered in bone --> Pulmonology consulted ?3/4: EGD significant for gastritis only  ?3/7: Colonoscopy showed a subtle mucosal irregularity at her anastomosis, biopsied, but no definite source of bleeding ?3/8: Biopsy from colonoscopy resulted adenoCA ?3/9: Gen Surg, Palliative Care and Oncology consulted ?3/10: Bronchoscopic biopsy of mediastinal lymph node by Pulmonology ?3/11: More tachycardia overnight to 120s, 130s, very weak today ? ? ? ? ? ?Assessment and Plan: ? ?* Possible right upper lobe pneumonia ?See summary from 3/10 ?Today more tachycardic, clinically worse ?- Broaden to vancomycin, cefepime, Flagyl ?- Check lactate, procal, troponin ?- Obtain cultures of blood ? ? ?   ? ?Symptomatic anemia ?See prior summary. ? ?Heme transfusing again today, we will reduce to 1 unit. ?- Continue Protonix  ?- IV Iron ? ?  ? ? ?Paroxysmal atrial fibrillation (HCC) ?Rate controlled ?Given recurrent anemia and unclear prognosis, I favor holding Elqiuis for a short time more ?-Hold Eliquis ?  ? ?Chronic diastolic CHF (congestive heart failure) ([place]) ?Hypotensive today ?-Hold oral Lasix ?- IV Lasix after transfusions ? -Hold amlodipine ?   ?  ? ?COPD with emphysema ([place]) ?No active flare  ?- Continue Incruse Ellipta and albuterol ? ?Essential hypertension ?Hypotension  today ?- Transfuse blood and post transfusion Lasix IV ?- Hold amlodipine ?  ? ? ? ? ? ? ? ? ? ?  ? ?Subjective: Looks weaker today, more tachycardic overnight, blood pressure soft overnight, poor p.o. intake ? ?Physical Exam: ?Vitals:  ? 05/27/21 0516 05/27/21 0949 05/27/21 1045 05/27/21 1106  ?BP: (!) 91/56 128/74 119/66 101/86  ?Pulse: (!) 120 (!) 117 (!) 117 99  ?Resp: 14 15 16 15   ?Temp: (!) 97.3 ?F (36.3 ?C) (!) 97.4 ?F (36.3 ?C) (!) 97.5 ?F (36.4 ?C) 97.7 ?F (36.5 ?C)  ?TempSrc: Oral Oral Oral Axillary  ?SpO2: 94% 96% 100% 99%  ?Weight:      ?Height:      ? ?Pale, frail elderly female, lying in bed, appears listless ?Respirations shallow, somewhat tachypneic, I do not appreciate rales, or some crackles in the right upper lobe ?Diminished throughout ?Tachycardic, regular, mild nonpitting peripheral edema ?Severe loss of subcutaneous muscle mass and fat diffusely ?Attention diminished, affect blunted, judgment insight appear mildly impaired ? ? ? ?Data Reviewed: ?Discussed with pulmonology.  Vital signs reviewed, nursing notes reviewed, oncology and pulmonary notes reviewed. ?Labs and imaging reports reviewed. ?Complete blood count notable for hemoglobin stable at 8.5, white blood cell slightly up to 17 ?CEA 349 ?Gram stain from her thoracentesis negative ?Basic metabolic panel normal ? ?Family Communication: Husbabnd at the bedside ? ? ?Disposition: ?Status is: Inpatient ?Remains inpatient appropriate because: The patient may have entered terminal decline, she certainly appears worse.  We will try to broaden antibiotics, give 1 unit of blood and see if this improves.  If she continues to decline our I suspect that she would be better served going home with hospice. ? ? ? Planned Discharge Destination: Skilled nursing facility ? ? ? ? ? ?Author: ?Edwin Dada, MD ?05/27/2021 12:19 PM ? ?For on call review [email or web address].  ?

## 2021-05-27 NOTE — Progress Notes (Signed)
Troponin this evening noted to be >1000.  There has been no chest pain but this evidence of MI is consistent with her sudden change of status.  I am uncertain if she is a candidate for PCI, and of the severity of her MI, but will start heparin.  Recent endoscopy suggests this will be safe, but we will closely watch clinically and monitor serial Hgbs.  Low threshold to stop.

## 2021-05-27 NOTE — Progress Notes (Signed)
Pharmacy Antibiotic Note ? ?[name] is a 76 y.o. female admitted on 05/31/2021. Patient has been on antibiotics throughout admission - currently on levofloxacin for PNA. Pharmacy has been consulted for vancomycin + cefepime dosing. ? ?Today, 05/27/21 ?WBC remains elevated ?SCr 0.77, will round to 1 for PK dosing due to TBW of only 42 kg ?Pt was previously on vancomycin, last dose given on 3/9 @ 1234. Levofloxacin has been discontinued ? ?Plan: ?Cefepime 2 g IV q12h ?Vancomycin 1000 mg LD followed by 750 mg IV q48h for estimated AUC of 406 ?Goal vancomycin AUC 400-550 ?Follow renal function. Check vancomycin levels at steady state as needed ? ?Height: 5' (152.4 cm) ?Weight: 41.9 kg (92 lb 6 oz) ?IBW/kg (Calculated) : 45.5 ? ?Temp (24hrs), Avg:97.8 ?F (36.6 ?C), Min:97.3 ?F (36.3 ?C), Max:98.6 ?F (37 ?C) ? ?Recent Labs  ?Lab 05/21/21 ?1054 05/22/21 ?6803 05/25/2021 ?2122 05/24/21 ?1043 05/25/21 ?0359 06/12/2021 ?4825 05/27/21 ?0037  ?WBC 20.3* 17.4* 23.6* 16.6* 17.2* 15.5* 17.8*  ?CREATININE 0.50 0.47  --   --  0.54 0.57 0.77  ?  ?Estimated Creatinine Clearance: 39.6 mL/min (by C-G formula based on SCr of 0.77 mg/dL).   ? ?Allergies  ?Allergen Reactions  ? Dabigatran Etexilate Mesylate Other (See Comments)  ?  INTERNAL BLEEDING- Pradaxa  ? Augmentin [Amoxicillin-Pot Clavulanate] Diarrhea and Nausea Only  ?  Did it involve swelling of the face/tongue/throat, SOB, or low BP? No ?Did it involve sudden or severe rash/hives, skin peeling, or any reaction on the inside of your mouth or nose? No ?Did you need to seek medical attention at a hospital or doctor's office? No ?When did it last happen? 02/2019 ?If all above answers are "NO", may proceed with cephalosporin use. ?  ? Talwin [Pentazocine] Other (See Comments)  ?  Hallucinations ?  ? ? ?[name], PharmD ?05/27/2021 11:40 AM ?

## 2021-05-27 NOTE — Progress Notes (Signed)
OT Cancellation Note ? ?Patient Details ?Name: [name] ?MRN: [id number] ?DOB: [birth year] ? ? ?Cancelled Treatment:    Reason Eval/Treat Not Completed: Fatigue/lethargy limiting ability to participate. Family declines therapy at this time. Patient fatigued and needing to sleep.  ? ?Lenward Chancellor ?05/27/2021, 2:11 PM ?

## 2021-05-27 NOTE — Progress Notes (Signed)
Critical Lab ? ?Results received: 05/27/21 @ 18:25 ?Test: Lactic Acid ?Critical Value:2.6 ?Name of Provider Notified: Myrene Buddy, MD ?Orders Received: None  ?

## 2021-05-27 NOTE — Assessment & Plan Note (Signed)
Fluid with low protein.  Gram stain negative.  Cytology pending - Add on fluid LDH

## 2021-05-27 NOTE — Progress Notes (Signed)
?   05/17/2021 1935  ?Assess: MEWS Score  ?Temp 98.4 ?F (36.9 ?C)  ?BP 122/76  ?Pulse Rate (!) 125  ?Resp 16  ?SpO2 92 %  ?O2 Device Nasal Cannula  ?Assess: MEWS Score  ?MEWS Temp 0  ?MEWS Systolic 0  ?MEWS Pulse 2  ?MEWS RR 0  ?MEWS LOC 0  ?MEWS Score 2  ?MEWS Score Color Yellow  ?Assess: if the MEWS score is Yellow or Red  ?Were vital signs taken at a resting state? Yes  ?Focused Assessment No change from prior assessment  ?Does the patient meet 2 or more of the SIRS criteria? No  ?MEWS guidelines implemented *See Row Information* No, previously yellow, continue vital signs every 4 hours  ?Treat  ?MEWS Interventions Administered prn meds/treatments;Administered scheduled meds/treatments  ?Escalate  ?MEWS: Escalate Yellow: discuss with charge nurse/RN and consider discussing with provider and RRT  ?Notify: Charge Nurse/RN  ?Name of Charge Nurse/RN Notified Renita, RN  ?Date Charge Nurse/RN Notified 06/09/2021  ?Time Charge Nurse/RN Notified 1935  ?Assess: SIRS CRITERIA  ?SIRS Temperature  0  ?SIRS Pulse 1  ?SIRS Respirations  0  ?SIRS WBC 0  ?SIRS Score Sum  1  ? ?Pt remains a yellow MEWs d/t increased pulse. Pts other vital signs WDL. Pt resting comfortably. Agricultural consultant notified. Plan of care continues.  ? ?

## 2021-05-28 ENCOUNTER — Inpatient Hospital Stay (HOSPITAL_COMMUNITY): Payer: HMO

## 2021-05-28 DIAGNOSIS — Z7189 Other specified counseling: Secondary | ICD-10-CM

## 2021-05-28 DIAGNOSIS — I4891 Unspecified atrial fibrillation: Secondary | ICD-10-CM | POA: Diagnosis not present

## 2021-05-28 DIAGNOSIS — R778 Other specified abnormalities of plasma proteins: Secondary | ICD-10-CM | POA: Diagnosis not present

## 2021-05-28 DIAGNOSIS — J9621 Acute and chronic respiratory failure with hypoxia: Secondary | ICD-10-CM | POA: Diagnosis not present

## 2021-05-28 DIAGNOSIS — J9620 Acute and chronic respiratory failure, unspecified whether with hypoxia or hypercapnia: Secondary | ICD-10-CM | POA: Diagnosis not present

## 2021-05-28 DIAGNOSIS — D638 Anemia in other chronic diseases classified elsewhere: Secondary | ICD-10-CM | POA: Diagnosis not present

## 2021-05-28 DIAGNOSIS — R918 Other nonspecific abnormal finding of lung field: Secondary | ICD-10-CM | POA: Diagnosis not present

## 2021-05-28 DIAGNOSIS — Z515 Encounter for palliative care: Secondary | ICD-10-CM

## 2021-05-28 DIAGNOSIS — I5A Non-ischemic myocardial injury (non-traumatic): Secondary | ICD-10-CM

## 2021-05-28 DIAGNOSIS — Z66 Do not resuscitate: Secondary | ICD-10-CM

## 2021-05-28 DIAGNOSIS — J470 Bronchiectasis with acute lower respiratory infection: Secondary | ICD-10-CM | POA: Diagnosis not present

## 2021-05-28 DIAGNOSIS — C189 Malignant neoplasm of colon, unspecified: Secondary | ICD-10-CM | POA: Diagnosis not present

## 2021-05-28 DIAGNOSIS — J189 Pneumonia, unspecified organism: Secondary | ICD-10-CM | POA: Diagnosis not present

## 2021-05-28 LAB — BPAM RBC
Blood Product Expiration Date: 202303202359
Blood Product Expiration Date: 202303202359
Blood Product Expiration Date: 202303212359
ISSUE DATE / TIME: 202303082027
ISSUE DATE / TIME: 202303111031
Unit Type and Rh: 600
Unit Type and Rh: 600
Unit Type and Rh: 600

## 2021-05-28 LAB — COMPREHENSIVE METABOLIC PANEL
ALT: 15 U/L (ref 0–44)
AST: 37 U/L (ref 15–41)
Albumin: 2.9 g/dL — ABNORMAL LOW (ref 3.5–5.0)
Alkaline Phosphatase: 94 U/L (ref 38–126)
Anion gap: 9 (ref 5–15)
BUN: 43 mg/dL — ABNORMAL HIGH (ref 8–23)
CO2: 27 mmol/L (ref 22–32)
Calcium: 9.6 mg/dL (ref 8.9–10.3)
Chloride: 100 mmol/L (ref 98–111)
Creatinine, Ser: 0.76 mg/dL (ref 0.44–1.00)
GFR, Estimated: >60 mL/min (ref >60)
Glucose, Bld: 116 mg/dL — ABNORMAL HIGH (ref 70–99)
Potassium: 3.6 mmol/L (ref 3.5–5.1)
Sodium: 136 mmol/L (ref 135–145)
Total Bilirubin: 1.8 mg/dL — ABNORMAL HIGH (ref 0.3–1.2)
Total Protein: 6 g/dL — ABNORMAL LOW (ref 6.5–8.1)

## 2021-05-28 LAB — TYPE AND SCREEN
ABO/RH(D): A NEG
ABO/RH(D): A NEG
Antibody Screen: NEGATIVE
Antibody Screen: NEGATIVE
Unit division: 0
Unit division: 0
Unit division: 0

## 2021-05-28 LAB — CBC WITH DIFFERENTIAL/PLATELET
Abs Immature Granulocytes: 0.93 10*3/uL — ABNORMAL HIGH (ref 0.00–0.07)
Abs Immature Granulocytes: 1.04 10*3/uL — ABNORMAL HIGH (ref 0.00–0.07)
Basophils Absolute: 0.1 10*3/uL (ref 0.0–0.1)
Basophils Absolute: 0.1 10*3/uL (ref 0.0–0.1)
Basophils Relative: 0 %
Basophils Relative: 1 %
Eosinophils Absolute: 0 10*3/uL (ref 0.0–0.5)
Eosinophils Absolute: 0 10*3/uL (ref 0.0–0.5)
Eosinophils Relative: 0 %
Eosinophils Relative: 0 %
HCT: 29.1 % — ABNORMAL LOW (ref 36.0–46.0)
HCT: 31 % — ABNORMAL LOW (ref 36.0–46.0)
Hemoglobin: 9.6 g/dL — ABNORMAL LOW (ref 12.0–15.0)
Hemoglobin: 9.9 g/dL — ABNORMAL LOW (ref 12.0–15.0)
Immature Granulocytes: 5 %
Immature Granulocytes: 5 %
Lymphocytes Relative: 5 %
Lymphocytes Relative: 6 %
Lymphs Abs: 0.9 10*3/uL (ref 0.7–4.0)
Lymphs Abs: 1.2 10*3/uL (ref 0.7–4.0)
MCH: 30.3 pg (ref 26.0–34.0)
MCH: 29.5 pg (ref 26.0–34.0)
MCHC: 33 g/dL (ref 30.0–36.0)
MCHC: 31.9 g/dL (ref 30.0–36.0)
MCV: 91.8 fL (ref 80.0–100.0)
MCV: 92.3 fL (ref 80.0–100.0)
Monocytes Absolute: 1.6 10*3/uL — ABNORMAL HIGH (ref 0.1–1.0)
Monocytes Absolute: 1.9 10*3/uL — ABNORMAL HIGH (ref 0.1–1.0)
Monocytes Relative: 9 %
Monocytes Relative: 9 %
Neutro Abs: 14 10*3/uL — ABNORMAL HIGH (ref 1.7–7.7)
Neutro Abs: 16.3 10*3/uL — ABNORMAL HIGH (ref 1.7–7.7)
Neutrophils Relative %: 81 %
Neutrophils Relative %: 79 %
Platelet Morphology: NORMAL
Platelet Morphology: NORMAL
Platelets: 154 10*3/uL (ref 150–400)
Platelets: 173 10*3/uL (ref 150–400)
RBC: 3.17 MIL/uL — ABNORMAL LOW (ref 3.87–5.11)
RBC: 3.36 MIL/uL — ABNORMAL LOW (ref 3.87–5.11)
RDW: 21.3 % — ABNORMAL HIGH (ref 11.5–15.5)
RDW: 21.5 % — ABNORMAL HIGH (ref 11.5–15.5)
WBC: 17.5 10*3/uL — ABNORMAL HIGH (ref 4.0–10.5)
WBC: 20.4 10*3/uL — ABNORMAL HIGH (ref 4.0–10.5)
nRBC: 2.7 % — ABNORMAL HIGH (ref 0.0–0.2)
nRBC: 3.1 % — ABNORMAL HIGH (ref 0.0–0.2)

## 2021-05-28 LAB — LACTIC ACID, PLASMA: Lactic Acid, Venous: 1.5 mmol/L (ref 0.5–1.9)

## 2021-05-28 LAB — ECHOCARDIOGRAM LIMITED
Height: 60 in
Weight: 1477.96 oz

## 2021-05-28 LAB — HEPARIN LEVEL (UNFRACTIONATED): Heparin Unfractionated: <0.1 IU/mL — ABNORMAL LOW (ref 0.30–0.70)

## 2021-05-28 LAB — TROPONIN I (HIGH SENSITIVITY)
Troponin I (High Sensitivity): 42 ng/L — ABNORMAL HIGH (ref <18)
Troponin I (High Sensitivity): 46 ng/L — ABNORMAL HIGH (ref <18)

## 2021-05-28 MED ORDER — DEXTROSE 10 % IV SOLN: INTRAVENOUS | Status: DC

## 2021-05-28 NOTE — Progress Notes (Signed)
?Progress Note ? ? ?Patient: [name] WGN:[id number] DOB: 01/14/1946 DOA: 06/03/2021     9 ?DOS: the patient was seen and examined on 05/28/2021 ?  ? ? ? ? ?Brief hospital course: ?[name] is a 76 y.o. F with permAF on Eliquis, COPD/bronchiectasis/MAI and chronic respiratory failure on 4L at home, CAD, hx PPM, chronic pain and dCHF who presented with weakness and back pain.  Also found incidentally to report melena and have FOBT in the ER.   ? ? ?3/2: Admitted, started on PPI given melena, transfused 2 units; given antibiotics given multifocal PNA on CXR ?3/3: GI consulted for melena; CT chest showed RUL mass, loculated effusion and lytic lesions scattered in bone --> Pulmonology consulted ?3/4: EGD significant for gastritis only  ?3/7: Colonoscopy showed a subtle mucosal irregularity at her anastomosis, biopsied, but no definite source of bleeding ?3/8: Biopsy from colonoscopy resulted adenoCA ?3/9: Gen Surg, Palliative Care and Oncology consulted ?3/10: Bronchoscopic biopsy of mediastinal lymph node by Pulmonology ?3/11: More tachycardia overnight to 120s, 130s, very weak today --> troponin 1000?, started heparin, continue limited scope care but code status changed to DNR ? ? ? ? ? ?Assessment and Plan: ? ?* Possible right upper lobe pneumonia ?See summary from 3/10 ?No improvement ?-Continue broad spectrum antibiotics ? ? ? ?Myocardial injury ?Vs NSTEMI ? ?Because of change in clinical status yesterday, troponin drawn.  This resulted late yesterday evening greater than thousand.  Aspirin given.  Heparin was started. ? ?She is not a candidate for cath.  Given her Hgb has been stable, and I have had previous discussions with GI about resuming Eliquis (for her Afib), and that she is >24 hrs from her biopsy, I think it is safe to anticoagulate with heparin in an empiric conservative strategy for NSTEMI.  Low threshold to stop if she continues to deteriorate.    ?- Trend troponin ?- Obtain stat echo ?-Continue  heparin gtt for now ?-Continue home statin ? ? ?Suspected but not confirmed metastatic lung cancer   ?We were hopeful that she was progrssing and could transition to Rehab or home, but she has deteriorated markedly in the last 2 days. ? ?Made DNR last night.  Seems less alert even today. ?- Follow biopsy ?- Palliative  care ? ? ? ?Symptomatic anemia ?Hgb up to 9 today, no clinical bleeding despite starting heparin last night ?- Continue Protonix  ? ?  ? ? ?Paroxysmal atrial fibrillation (HCC) ?Rate controlled ?- Start heparin ?  ? ?Chronic diastolic CHF (congestive heart failure) ([place]) ?Appears edematous.  Got IV lasix yesteday.  Hemodynamics do not allow further diuresis safely at this time. ?- Obtain Echo   ?   ?  ? ? ? ?  ? ? ? ? ? ? ?  ? ?Subjective: Again very weak, restless.  She complains of back pain.  Denies chest pain, dyspnea although she appears out of breath. ? ?Physical Exam: ?Vitals:  ? 05/27/21 2006 05/27/21 2021 05/28/21 0305 05/28/21 0445  ?BP: 115/83 115/83 109/63 115/88  ?Pulse: (!) 120 (!) 105 (!) 120 (!) 117  ?Resp: 16  18 16   ?Temp: 98.3 ?F (36.8 ?C)  97.9 ?F (36.6 ?C) (!) 97.5 ?F (36.4 ?C)  ?TempSrc: Oral  Oral Oral  ?SpO2: 95%  90% 92%  ?Weight:      ?Height:      ? ?Cachectic elderly female, lying in bed, appears ill ?Tachycardic, peripheral edema, no murmurs ?Respirations slow, shallow, not quite agonal but labored.  Diminished throughout. ?Attention diminished, affect blunted. ?She is beginning to have some chemosis of the conjunctiva, I suspect she is terminal  ? ? ? ?Data Reviewed: ?Discussed with pulmonology.  Vital signs reviewed, nursing notes reviewed, oncology and pulmonary notes reviewed. ?Labs and imaging reports reviewed. ?Troponin last night 1000, this morning it is abnormally low, repeat is pending ?Renal function unchanged, electrolytes normal ?Hemoglobin up to 9 ? ?Family Communication:   ? ? ?Disposition: ?Status is: Inpatient ?Remains inpatient appropriate because: The  patient may have entered terminal decline, she certainly appears worse again today.  We will recommend family come to bedside. ? ?Continue to follow.  She may progress faster soon ? ? Planned Discharge Destination: Unclear ? ? ? ? ? ?Author: ?Edwin Dada, MD ?05/28/2021 8:27 AM ? ?For on call review [email or web address]  ?

## 2021-05-28 NOTE — Progress Notes (Signed)
Received call from lab stating troponin was coming back as 49 (ran twice) and yesterday it was 1,182. Notified Myrene Buddy, MD. He states to re-test. Sent new sample to lab. ?

## 2021-05-28 NOTE — Progress Notes (Signed)
Critical troponin 1182 received from lab @2010 . On call provider, Jeannette Corpus NP, notified. New orders for heparin gtt acknowledged and started. ?

## 2021-05-28 NOTE — Progress Notes (Signed)
?  Echocardiogram ?2D Echocardiogram limited has been performed. ? [name] ?05/28/2021, 8:37 AM ?

## 2021-05-28 NOTE — Progress Notes (Signed)
Patient in yellow MEWS due to pulse. She is on several narcotics for pain management. Patient is resting in bed, no signs of distress. Attending doctor and palliative doctor aware of vitals. At this time goals of care are moving more towards comfort care. Yellow MEWS not initiated. ?

## 2021-05-28 NOTE — Care Plan (Signed)
Discussed with lab.  Blood sample from last night re-analyzed.  On re-analysis, the troponin at that time was ~40, NOT >1000.  It appears the reported value >1000 was an error.   ? ?This was confirmed twice and samples from this morning were also reanalyzed and are correct.  A safety zone report has been initiated in the lab. ? ?This confirms myocardial injury, but rules out infarction.  In fact, echo today shows normal EF, which supports this. ? ?Heparin has been stopped.  Although we thought briefly this provided a (likely untreatable) explanation for her decline, the proof of her decline is still obvious in her worsening mentation, oral intake, and strength.  I worry that things are accelerating and the window of opportunity to transfer to home is closing. ?

## 2021-05-28 NOTE — Progress Notes (Signed)
Daily Progress Note   Patient Name: [name]       Date: 05/28/2021 DOB: [birth year]  Age: 76 y.o. MRN#: [id number] Attending Physician: Edwin Dada, * Primary Care Physician: Carollee Herter, Alferd Apa, DO Admit Date: 05/17/2021  Reason for Consultation/Follow-up: Establishing goals of care  Subjective: I saw and examined [name] today and met with her and her goddaughter.  I then followed up later in the day and met with her in conjunction with her husband and oldest grandson.  We discussed clinical course as well as wishes moving forward in regard to her care plan this hospitalization discussed.  Her husband inquired again about thoughts on her prognosis and stated that if she is not likely to recover to a meaningful functional baseline, they would like to discuss taking her home with hospice support.  We discussed difference between a aggressive medical intervention path and a palliative, comfort focused care path.  Values and goals of care important to patient and family were attempted to be elicited.   We discussed plan to continue to follow clinical course closely and plan for family meeting to discuss further tomorrow at [place].  Questions and concerns addressed.   PMT will continue to support holistically.   Length of Stay: 9  Current Medications: Scheduled Meds:   acetaminophen  1,000 mg Oral TID   vitamin C  250 mg Oral BID   atorvastatin  20 mg Oral QHS   Chlorhexidine Gluconate Cloth  6 each Topical Daily   collagenase   Topical Daily   feeding supplement  1 Container Oral BID BM   furosemide  20 mg Intravenous Once   gabapentin  300 mg Oral BID   And   gabapentin  600 mg Oral QHS   multivitamin with minerals  1 tablet Oral Daily   nutrition supplement (JUVEN)   1 packet Oral BID BM   nystatin   Topical TID   pantoprazole  40 mg Oral Daily   sodium chloride flush  10-40 mL Intracatheter Q12H   umeclidinium bromide  1 puff Inhalation Daily   zinc sulfate  220 mg Oral Daily    Continuous Infusions:  ceFEPime (MAXIPIME) IV Stopped (05/28/21 1554)   dextrose 30 mL/hr at 05/28/21 1633   metronidazole Stopped (05/28/21 1205)   [START ON 05/29/2021]  vancomycin      PRN Meds: albuterol, hydrALAZINE, LORazepam, morphine injection, ondansetron **OR** ondansetron (ZOFRAN) IV, oxyCODONE **AND** [DISCONTINUED] acetaminophen, prochlorperazine, sodium chloride flush, zolpidem  Physical Exam        General: Alert, awake, in no acute distress.  Appears more frail and weak Heart: Tachycardic  lungs: Fair air movement  Ext: No significant edema Skin: Warm and dry  Vital Signs: BP 107/70 (BP Location: Right Arm)    Pulse (!) 116    Temp 98.9 F (37.2 C) (Oral)    Resp 16    Ht 5' (1.524 m)    Wt 41.9 kg    SpO2 99%    BMI 18.04 kg/m  SpO2: SpO2: 99 % O2 Device: O2 Device: Nasal Cannula O2 Flow Rate: O2 Flow Rate (L/min): 4 L/min  Intake/output summary:  Intake/Output Summary (Last 24 hours) at 05/28/2021 2203 Last data filed at 05/28/2021 2139 Gross per 24 hour  Intake 687.13 ml  Output 100 ml  Net 587.13 ml    LBM: Last BM Date : 05/27/21 Baseline Weight: Weight: 38.6 kg Most recent weight: Weight: 41.9 kg       Palliative Assessment/Data:    Flowsheet Rows    Flowsheet Row Most Recent Value  Intake Tab   Referral Department Hospitalist  Unit at Time of Referral Oncology Unit  Palliative Care Primary Diagnosis Cancer  Date Notified 05/24/21  Palliative Care Type New Palliative care  Reason for referral Clarify Goals of Care  Date of Admission 06/03/2021  Date first seen by Palliative Care 05/25/21  # of days Palliative referral response time 1 Day(s)  # of days IP prior to Palliative referral 6  Clinical Assessment   Palliative  Performance Scale Score 40%  Psychosocial & Spiritual Assessment   Palliative Care Outcomes   Patient/Family meeting held? Yes  Who was at the meeting? Patient       Patient Active Problem List   Diagnosis Date Noted   Adhesions of intestine, very dense, with frozen abdomen 12/11/2011   Hyperlipidemia LDL goal <100 07/08/2017   Insomnia 05/27/2014   Possible right upper lobe pneumonia 05/19/2021   Myocardial injury 05/28/2021   Symptomatic anemia 05/22/2021   Mass of upper lobe of right lung with suspected lytic lesions of bone 05/25/2021   Adenocarcinoma, colon ([place]) 05/24/2021   Paroxysmal atrial fibrillation ([place]) 12/08/2007   DNR (do not resuscitate) 05/28/2021   Goals of care, counseling/discussion 05/28/2021   Palliative care patient 05/28/2021   Adenopathy 05/17/2021   Mediastinal adenopathy    Pleural effusion    Unstageable pressure ulcer of sacral region ([place]) 05/19/2021   Acute and chronic respiratory failure (acute-on-chronic) ([place]) 05/19/2021   Community acquired pneumonia of right lung    Recurrent falls 06/05/2021   Hematoma 08/07/2019   Other neutropenia ([place])    Post-procedural fever 07/20/2019   Acute febrile illness 07/16/2019   Pulmonary MAI (mycobacterium avium-intracellulare) infection ([place]) 06/24/2019   History of total right hip replacement 05/27/2019   NICM (nonischemic cardiomyopathy) ([place]) 05/19/2019   Educated about COVID-19 virus infection 09/07/2018   Erythropoietin deficiency anemia 06/19/2018   Swelling of calf 02/04/2018   Bad odor of urine 12/09/2017   Iron deficiency anemia due to chronic blood loss 08/14/2017   Iron malabsorption 08/14/2017   Elevated ALT measurement    Hypotension 06/20/2017   HLD (hyperlipidemia) 06/20/2017   CAD (coronary artery disease) 06/20/2017   Protein-calorie malnutrition, severe ([place]) 06/20/2017   Generalized weakness    Weakness  05/30/2017   Focal neurological deficit 05/30/2017   Chronic diastolic CHF  (congestive heart failure) ([place]) 05/30/2017   TIA (transient ischemic attack) 05/30/2017   AKI (acute kidney injury) ([place]) 05/30/2017   Abnormal LFTs 05/30/2017   Bronchiectasis ([place]) 04/22/2017   Right otitis media 07/17/2016   Cerumen impaction 07/17/2016   Osteoporosis 03/16/2016   Headache 11/20/2014   Skin infection 05/27/2014   Abdominal wall pain 07/09/2013   Other dysphagia 02/02/2013   Platelets decreased ([place]) 01/29/2012   Vitamin D deficiency 12/13/2011   Hypokalemia 12/13/2011   Hypocalcemia 12/12/2011   SBO (small bowel obstruction), recurrent 12/11/2011   Protein-calorie malnutrition, moderate ([place]) 12/11/2011   Oxygen dependent    Hx of gastric ulcer    UTI (urinary tract infection) 10/15/2011   Allergic conjunctivitis 10/15/2011   Degenerative arthritis of hip 08/03/2011   B12 deficiency 07/02/2011   Bilateral leg weakness 01/16/2011   Anemia, chronic disease 09/14/2010   Recurrent pneumonia 08/11/2010   CRT- [employer]tr[employer] 06/26/2010   DYSURIA 05/19/2010   Chronic hypoxemic respiratory failure ([place]) 02/21/2010   DEHYDRATION 01/06/2010   GASTROENTERITIS 01/06/2010   Congestive dilated cardiomyopathy ([place]) 11/17/2009   LBBB 91/50/5697   SYSTOLIC HEART FAILURE, CHRONIC 11/17/2009   GERD (gastroesophageal reflux disease) 02/17/2009   NONSPECIFIC ABN FINDNG RAD&OTH EXAM BILARY TRCT 09/03/2008   COPD with emphysema ([place]) 06/28/2008   DIVERTICULOSIS, COLON 06/03/2008   GASTRITIS 06/02/2008   GASTROPARESIS 08/07/2007   NAUSEA, CHRONIC 08/07/2007   Abdominal pain 08/07/2007   SYNCOPE 02/17/2007   RESTLESS LEG SYNDROME 08/01/2006   Chronic pain syndrome 08/01/2006   OSTEOARTHRITIS 07/31/2006   Degenerative disc disease, lumbar 07/31/2006   Fibromyalgia 07/31/2006   CHRONIC FATIGUE SYNDROME 07/31/2006   URINARY INCONTINENCE 07/31/2006   ANEMIA-IRON DEFICIENCY 05/01/2006   Essential hypertension 05/01/2006    Palliative Care Assessment & Plan   Patient  Profile: 76 y.o. female  with past medical history of COPD/emphysema/MAI on 4 L O2 at home, new pulmonary mass admitted, anemia on erythropoietin followed by Dr. Marin Olp, A-fib on Eliquis, multiple prior abdominal surgeries with significant colon resections, hypertension, CAD, GERD, CHF, fibromyalgia and pacemaker on 06/09/2021 with increased weakness and dark stool.  She had EGD that was revealing for gastritis and then had colonoscopy that revealed adenocarcinoma with signet ring cell features at the anastomosis of prior surgery.  She has also undergone bronchoscopic evaluation and biopsy for lung mass. palliative consulted for goals of care.  Recommendations/Plan: DNR/DNI Continue current care. Husband would like to have family meeting tomorrow in conjunction with Dr. Loleta Books at [place] Status:    Code Status Orders  (From admission, onward)           Start     Ordered   05/27/21 1807  Do not attempt resuscitation (DNR)  Continuous       Question Answer Comment  In the event of cardiac or respiratory ARREST Do not call a code blue   In the event of cardiac or respiratory ARREST Do not perform Intubation, CPR, defibrillation or ACLS   In the event of cardiac or respiratory ARREST Use medication by any route, position, wound care, and other measures to relive pain and suffering. May use oxygen, suction and manual treatment of airway obstruction as needed for comfort.      05/27/21 1807           Code Status History     Date Active Date Inactive Code Status Order ID Comments User Context  05/21/2021 2104 05/27/2021 1807 Full Code [id number]  Mariel Aloe, MD Inpatient   07/20/2019 2012 07/23/2019 1615 Full Code [id number]  Rise Patience, MD Inpatient   07/16/2019 0211 07/18/2019 2137 Full Code [id number]  Etta Quill, DO ED   06/19/2019 1405 06/19/2019 2019 Full Code [id number]  Deboraha Sprang, MD Inpatient   06/20/2017 0323 07/02/2017 1623 Full Code [id number]  Ivor Costa, MD  ED   05/31/2017 0012 06/01/2017 1918 Full Code [id number]  Ivor Costa, MD Inpatient   05/17/2014 1633 05/23/2014 1619 Full Code [id number]  Cristal Ford, DO Inpatient   12/11/2011 2340 12/18/2011 1618 Full Code [id number]  Ruben Gottron, RN Inpatient   08/03/2011 1247 08/07/2011 1611 Full Code [id number]  Arletha Pili, RN Inpatient      Advance Directive Documentation    Flowsheet Row Most Recent Value  Type of Advance Directive Living will  Pre-existing out of facility DNR order (yellow form or pink MOST form) --  "MOST" Form in Place? --       Prognosis: Guarded  Discharge Planning: To Be Determined  Care plan was discussed with patient and her family including husband, son, goddaughter  Thank you for allowing the Palliative Medicine Team to assist in the care of this patient.   Total Time 50 Prolonged Time Billed No   Micheline Rough, MD  Please contact Palliative Medicine Team phone at [phone] for questions and concerns.

## 2021-05-28 NOTE — Progress Notes (Signed)
Daily Progress Note   Patient Name: [name]       Date: 05/28/2021 DOB: [birth year]  Age: 76 y.o. MRN#: [id number] Attending Physician: Edwin Dada, * Primary Care Physician: Carollee Herter, Alferd Apa, DO Admit Date: 06/06/2021  Reason for Consultation/Follow-up: Establishing goals of care  Subjective: I saw and examined Ms. Grenier today and met with her husband, goddaughter, and one of her sons in conjunction with Dr. Loleta Books.  We began by reviewing the things most important to Ms. Warr being her family and her faith.  We discussed her clinical course this admission with new diagnosis of colon cancer, lung mass concerning for cancer with biopsy pending, and sclerotic lesions noted on imaging.  We discussed her decline over the past couple of days and we reviewed overall changes in her nutrition, cognition, and functional status.  We discussed that in light of multiple chronic medical problems that have worsened with acute problems this admission, care should be focused on interventions that are likely to allow Ms. Vanderkolk to achieve goal of getting back to home and spending time with family. Discussed with her and her family regarding heroic interventions at the end-of-life and she states this would not be in line with prior expressed wishes for a natural death and understands it would not be likely to lead to getting well enough to go back home.  She was in agreement with changing CODE STATUS to DO NOT RESUSCITATE.  We also discussed pain management and strategies to manage symptoms as best possible.  She reports she is currently still hurting and Dr. Loleta Books is going to adjust her regimen.  Discussed that will be important to see how she does over the next couple of days when  trying to determine next steps and overall prognosis and ability to potentially tolerate disease modifying therapy for her cancer.  Family noted that if this is not something that she would recover from, they would be open to conversation regarding potential for home hospice.  Discussed we will continue to try to determine what goals fit best into what is most important to her best once we have a better idea of her overall clinical course over the next couple of days.  Length of Stay: 9  Current Medications: Scheduled Meds:   acetaminophen  1,000 mg Oral TID  vitamin C  250 mg Oral BID   atorvastatin  20 mg Oral QHS   Chlorhexidine Gluconate Cloth  6 each Topical Daily   collagenase   Topical Daily   feeding supplement  1 Container Oral BID BM   furosemide  20 mg Intravenous Once   gabapentin  300 mg Oral BID   And   gabapentin  600 mg Oral QHS   multivitamin with minerals  1 tablet Oral Daily   nutrition supplement (JUVEN)  1 packet Oral BID BM   nystatin   Topical TID   pantoprazole  40 mg Oral Daily   sodium chloride flush  10-40 mL Intracatheter Q12H   umeclidinium bromide  1 puff Inhalation Daily   zinc sulfate  220 mg Oral Daily    Continuous Infusions:  ceFEPime (MAXIPIME) IV 2 g (05/28/21 0086)   heparin 500 Units/hr (05/27/21 2248)   metronidazole 500 mg (05/27/21 2314)   [START ON 05/29/2021] vancomycin      PRN Meds: albuterol, hydrALAZINE, LORazepam, morphine injection, ondansetron **OR** ondansetron (ZOFRAN) IV, oxyCODONE **AND** [DISCONTINUED] acetaminophen, prochlorperazine, sodium chloride flush, zolpidem  Physical Exam        General: Alert, awake, in no acute distress.  Appears more frail and weak Heart: Tachycardic  lungs: Fair air movement  Ext: No significant edema Skin: Warm and dry  Vital Signs: BP 115/88 (BP Location: Left Arm)    Pulse (!) 117    Temp (!) 97.5 F (36.4 C) (Oral)    Resp 16    Ht 5' (1.524 m)    Wt 41.9 kg    SpO2 92%    BMI 18.04  kg/m  SpO2: SpO2: 92 % O2 Device: O2 Device: Nasal Cannula O2 Flow Rate: O2 Flow Rate (L/min): 4 L/min  Intake/output summary:  Intake/Output Summary (Last 24 hours) at 05/28/2021 0841 Last data filed at 05/28/2021 0600 Gross per 24 hour  Intake 1297.95 ml  Output 300 ml  Net 997.95 ml   LBM: Last BM Date : 05/27/21 Baseline Weight: Weight: 38.6 kg Most recent weight: Weight: 41.9 kg       Palliative Assessment/Data:    Flowsheet Rows    Flowsheet Row Most Recent Value  Intake Tab   Referral Department Hospitalist  Unit at Time of Referral Oncology Unit  Palliative Care Primary Diagnosis Cancer  Date Notified 05/24/21  Palliative Care Type New Palliative care  Reason for referral Clarify Goals of Care  Date of Admission 06/13/2021  Date first seen by Palliative Care 05/25/21  # of days Palliative referral response time 1 Day(s)  # of days IP prior to Palliative referral 6  Clinical Assessment   Palliative Performance Scale Score 40%  Psychosocial & Spiritual Assessment   Palliative Care Outcomes   Patient/Family meeting held? Yes  Who was at the meeting? Patient       Patient Active Problem List   Diagnosis Date Noted   Adhesions of intestine, very dense, with frozen abdomen 12/11/2011   Hyperlipidemia LDL goal <100 07/08/2017   Insomnia 05/27/2014   Possible right upper lobe pneumonia 06/02/2021   Myocardial injury 05/28/2021   Symptomatic anemia 06/16/2021   Mass of upper lobe of right lung with suspected lytic lesions of bone 05/25/2021   Adenocarcinoma, colon ([place]) 05/24/2021   Paroxysmal atrial fibrillation ([place]) 12/08/2007   Adenopathy 06/12/2021   Mediastinal adenopathy    Pleural effusion    Unstageable pressure ulcer of sacral region ([place]) 05/19/2021   Acute on chronic  respiratory failure with hypoxemia ([place]) 05/19/2021   Community acquired pneumonia of right lung    Recurrent falls 06/06/2021   Hematoma 08/07/2019   Other neutropenia ([place])     Post-procedural fever 07/20/2019   Acute febrile illness 07/16/2019   Pulmonary MAI (mycobacterium avium-intracellulare) infection ([place]) 06/24/2019   History of total right hip replacement 05/27/2019   NICM (nonischemic cardiomyopathy) ([place]) 05/19/2019   Educated about COVID-19 virus infection 09/07/2018   Erythropoietin deficiency anemia 06/19/2018   Swelling of calf 02/04/2018   Bad odor of urine 12/09/2017   Iron deficiency anemia due to chronic blood loss 08/14/2017   Iron malabsorption 08/14/2017   Elevated ALT measurement    Hypotension 06/20/2017   HLD (hyperlipidemia) 06/20/2017   CAD (coronary artery disease) 06/20/2017   Protein-calorie malnutrition, severe ([place]) 06/20/2017   Generalized weakness    Weakness 05/30/2017   Focal neurological deficit 05/30/2017   Chronic diastolic CHF (congestive heart failure) ([place]) 05/30/2017   TIA (transient ischemic attack) 05/30/2017   AKI (acute kidney injury) ([place]) 05/30/2017   Abnormal LFTs 05/30/2017   Bronchiectasis ([place]) 04/22/2017   Right otitis media 07/17/2016   Cerumen impaction 07/17/2016   Osteoporosis 03/16/2016   Headache 11/20/2014   Skin infection 05/27/2014   Abdominal wall pain 07/09/2013   Other dysphagia 02/02/2013   Platelets decreased ([place]) 01/29/2012   Vitamin D deficiency 12/13/2011   Hypokalemia 12/13/2011   Hypocalcemia 12/12/2011   SBO (small bowel obstruction), recurrent 12/11/2011   Protein-calorie malnutrition, moderate ([place]) 12/11/2011   Oxygen dependent    Hx of gastric ulcer    UTI (urinary tract infection) 10/15/2011   Allergic conjunctivitis 10/15/2011   Degenerative arthritis of hip 08/03/2011   B12 deficiency 07/02/2011   Bilateral leg weakness 01/16/2011   Anemia, chronic disease 09/14/2010   Recurrent pneumonia 08/11/2010   CRT- [employer]tr[employer] 06/26/2010   DYSURIA 05/19/2010   Chronic hypoxemic respiratory failure ([place]) 02/21/2010   DEHYDRATION 01/06/2010   GASTROENTERITIS 01/06/2010    Congestive dilated cardiomyopathy ([place]) 11/17/2009   LBBB 03/50/0938   SYSTOLIC HEART FAILURE, CHRONIC 11/17/2009   GERD (gastroesophageal reflux disease) 02/17/2009   NONSPECIFIC ABN FINDNG RAD&OTH EXAM BILARY TRCT 09/03/2008   COPD with emphysema ([place]) 06/28/2008   DIVERTICULOSIS, COLON 06/03/2008   GASTRITIS 06/02/2008   GASTROPARESIS 08/07/2007   NAUSEA, CHRONIC 08/07/2007   Abdominal pain 08/07/2007   SYNCOPE 02/17/2007   RESTLESS LEG SYNDROME 08/01/2006   Chronic pain syndrome 08/01/2006   OSTEOARTHRITIS 07/31/2006   Degenerative disc disease, lumbar 07/31/2006   Fibromyalgia 07/31/2006   CHRONIC FATIGUE SYNDROME 07/31/2006   URINARY INCONTINENCE 07/31/2006   ANEMIA-IRON DEFICIENCY 05/01/2006   Essential hypertension 05/01/2006    Palliative Care Assessment & Plan   Patient Profile: 76 y.o. female  with past medical history of COPD/emphysema/MAI on 4 L O2 at home, new pulmonary mass admitted, anemia on erythropoietin followed by Dr. Marin Olp, A-fib on Eliquis, multiple prior abdominal surgeries with significant colon resections, hypertension, CAD, GERD, CHF, fibromyalgia and pacemaker on 06/08/2021 with increased weakness and dark stool.  She had EGD that was revealing for gastritis and then had colonoscopy that revealed adenocarcinoma with signet ring cell features at the anastomosis of prior surgery.  She has also undergone bronchoscopic evaluation and biopsy for lung mass. palliative consulted for goals of care.  Recommendations/Plan: DNR/DNI Continue current care for the next couple days to try and determine her overall goal status with recent modifications to her regimen. Palliative care to continue to follow  Code Status:  Code Status Orders  (From admission, onward)           Start     Ordered   05/27/21 1807  Do not attempt resuscitation (DNR)  Continuous       Question Answer Comment  In the event of cardiac or respiratory ARREST Do not call a code blue    In the event of cardiac or respiratory ARREST Do not perform Intubation, CPR, defibrillation or ACLS   In the event of cardiac or respiratory ARREST Use medication by any route, position, wound care, and other measures to relive pain and suffering. May use oxygen, suction and manual treatment of airway obstruction as needed for comfort.      05/27/21 1807           Code Status History     Date Active Date Inactive Code Status Order ID Comments User Context   06/07/2021 2104 05/27/2021 1807 Full Code [id number]  Mariel Aloe, MD Inpatient   07/20/2019 2012 07/23/2019 1615 Full Code [id number]  Rise Patience, MD Inpatient   07/16/2019 0211 07/18/2019 2137 Full Code [id number]  Etta Quill, DO ED   06/19/2019 1405 06/19/2019 2019 Full Code [id number]  Deboraha Sprang, MD Inpatient   06/20/2017 0323 07/02/2017 1623 Full Code [id number]  Ivor Costa, MD ED   05/31/2017 0012 06/01/2017 1918 Full Code [id number]  Ivor Costa, MD Inpatient   05/17/2014 1633 05/23/2014 1619 Full Code [id number]  Cristal Ford, DO Inpatient   12/11/2011 2340 12/18/2011 1618 Full Code [id number]  Ruben Gottron, RN Inpatient   08/03/2011 1247 08/07/2011 1611 Full Code [id number]  Arletha Pili, RN Inpatient      Advance Directive Documentation    Flowsheet Row Most Recent Value  Type of Advance Directive Living will  Pre-existing out of facility DNR order (yellow form or pink MOST form) --  "MOST" Form in Place? --       Prognosis: Guarded  Discharge Planning: To Be Determined  Care plan was discussed with patient and her family including husband, son, goddaughter  Thank you for allowing the Palliative Medicine Team to assist in the care of this patient.   Total Time 55 Prolonged Time Billed No   Micheline Rough, MD  Please contact Palliative Medicine Team phone at [phone] for questions and concerns.

## 2021-05-28 NOTE — Assessment & Plan Note (Addendum)
Lab error on 3/11 initially suggested NSTEMI, which has now been ruled out.  She was briefly on heparin gtt, now stopped.    EF normal, but tachycardic.

## 2021-05-28 NOTE — Progress Notes (Addendum)
NAME:  [name], MRN:  [id number], DOB:  1946/01/09, LOS: 9 ADMISSION DATE:  06/09/2021, CONSULTATION DATE:  05/19/21 REFERRING MD:  [facility], CHIEF COMPLAINT:  MAI/ bronchiectasis  with new effusion, lung nodule   BRIEF  76 yowf quit smoking 76 y PTA with melena and followed by [facility] for MAI with last rx June 2022 tol poorly and since then general state of decline, uses w/c to go anyhere outside the house with rattling cough productive of white mucus and doe x more than room to room though also limited by weakness.   Denies dysphagia or cp fever chills or NS   Pertinent  Medical History  Anemia  Arthritis, Atrial fibrillation  CAD  COPD\  Diverticulitis  Erythropoietin deficiency anemia   Essential hypertension Fibromyalgia Gastroparesis GERD Hepatitis History of pneumonia MRSA infection,  gastric ulcer Iron malabsorption   LBBB (left bundle branch block)  Oxygen dependent Pacemaker Pleural effusion,  Primary dilated cardiomyopathy  Silent aspiration Small bowel obstruction Urinary incontinence Venous embolism and thrombosis of subclavian vein (  Vitamin B12 deficiency   EVENTS  3/2: Admitted, started on PPI given melena, transfused 2 units; given antibiotics given multifocal PNA on CXR 05/19/21 - Lying quietly in bed on 4lpm NP nad cc dry mouth as npo for endoscopy 3/3: GI consulted for melena; CT chest showed RUL mass, loculated effusion and lytic lesions scattered in bone --> Pulmonology consulted 3/4: EGD significant for gastritis only  3/7: Colonoscopy showed a subtle mucosal irregularity at her anastomosis, biopsied, but no definite source of bleeding 3/8: Biopsy from colonoscopy resulted adenoCA 3/9: Gen Surg, Palliative Care and Oncology consulted  - [facility] consideirng IR consult for RUL mass bx due to RUL mass with Lytic bone lesions   3/10 - 3/.10/23 - [facility] recalling PCCM. [name] Has pulmonary findings that needs ddifferentiating of infection v mets to  lung. There is IR consult pending for the lytic bone lesion. She has failure to thrive and is ECOG 04. Is on 4L [place] oper [facility]. PEr family husband and patoient -> on o2 x 2 years at 4L [place]. Not driven in 6 weeks. ECOG 3-4 x 4 weeks and bed bouned 2 weeks prior to admit and failure to thrive and weight loss and deconditioned  -Multidisciplinary goals of care: Full code  -Right anterior chest wall ultrasound-guided thoracentesis by Dr. Valeta Harms: 300 cc of clear yellow fluid  -Endobronchial ultrasound fine-needle biopsy station 7 node: Positive for adenocarcinoma.  Stains requested.  -PET scan ordered and pending   05/27/2021: Hemoglobin 8.5 g%.  Blood transfusion ongoing.  Continues on 4 L nasal cannula.  Hospitalist worried that patient is looking more frail and deconditioned and not looking well.  Husband at the bedside.  Afebrile and labs look okay so far.  Oncology concerned about her performance status.  Immunotherapy will be the only option depending on molecular results. Interim History / Subjective:   3/12-23 - 4L [place]. Trop > 1000 l;ast night and improve t 46 now. ECHO normal EF without focal wall motion abornalities. On IV heparin. Now DNR. Husband at bedside - processing goals of care and direction. Says he is realistic that she is declining and could pass away soon (Days - weeks) and wants to ensure comfort at all times. Did express to him she is concurrent medical and comfort care but comfort needs are increaseing. He has step daughter (patients biologic) in [place] who is on her way and told me she is interested in TPN. Did  explain to him TPN has side effeects and will not help someone in terminal decline. He is processing that in for  She received ambien for insomnia nad opioids for pain - sleeping most of the time.   Objective   Blood pressure 115/88, pulse (!) 117, temperature (!) 97.5 F (36.4 C), temperature source Oral, resp. rate 16, height 5' (1.524 m), weight 41.9 kg, SpO2 92 %.     FiO2 (%):  [36 %] 36 %   Intake/Output Summary (Last 24 hours) at 05/28/2021 1121 Last data filed at 05/28/2021 0600 Gross per 24 hour  Intake 1197.95 ml  Output 300 ml  Net 897.95 ml    Estimated body mass index is 18.04 kg/m as calculated from the following:   Height as of this encounter: 5' (1.524 m).   Weight as of this encounter: 41.9 kg.  Filed Weights   05/22/21 0439 06/10/2021 0635 05/24/21 0540  Weight: 41.6 kg 40 kg 41.9 kg    Examination: Extremely frail female on 4 L oxygen.  Spent the time just sleeping.  She briefly aroused and told me that she had a good night after her insomnia medication and then drifted back to sleep.  No distress.  Normal heart sounds.  Looks comfortable.  Ct chest with contrast 06/08/2021  - personally visualized.  Interval development since April 2022 of masslike consolidation within the a right upper lobe in an area of more focal consolidation noted on prior examination suspicious for primary bronchogenic malignancy. Associated shotty right pre-vascular, and periaortic adenopathy as well as necrotic right hilar adenopathy. Interval development of numerous lytic lesions throughout the visualized axial skeleton. PET CT examination may be helpful for further evaluation.   Superimposed changes of a chronic atypical infection or infectious bronchiolitis with bronchiolectasis, airway impaction, and peripheral subpleural consolidation reticulation grossly stable since prior examination.   Interval development of partially loculated right pleural effusion. Diagnostic thoracentesis may be helpful for further management.   Extensive multi-vessel coronary artery calcification. Morphologic changes in keeping with pulmonary arterial hypertension.   Aortic Atherosclerosis (ICD10-I70.0).       Assessment & Plan:  #Background prior to admit and present on admit  - Failure to Thrive with unintentioal weight loss - Cachexia - BMI 18  - Severe protein  calorie malnurition - alb 2.9 at admit  - Chronic respirartory fialrue - 4L [place]  - Bed bound eCOG 4 with severe physical deconditioning. - Hx of sacral deub prior to admit - eschar unstageable per RN - Hx of smokng   #Present on admit  - Colon Cancer  - admission problemn - Lytic bone mets - ? Due to above  - At admission also   -- Hilar node - necrotic -status post right needle biopsy 05/21/2021: Positive for cancer  - RUL consolidation with mass and loculated subpleural fluid cllection anteriorky   -Status post 300 cc thoracentesis.?  Transudate.  Results for cytology pending.  -High troponin 05/27/2021 on IV heparin.  Normal EF.  05/28/21 -increasingly concerned that she is in terminal decline.  She is not a candidate for cardiac intervention.  She has increasing pain needs and insomnia needs for which she receives opioids and benzos.  Plan  -Continue oxygen Await lung cytology report Heparin per the hospitalist Agree with opioids for pain Agree with benzos for insomnia  Very likely she is in terminal decline.  I believe the prognosis is probably in the order of days to weeks.  I did encourage her husband to  think about comfort care.  She is entirely comfort care and basic medical care.  Did express to him that TPN is not beneficial.  He is of understanding.  The daughter is awaited from [place].  He has a good relationship with her.  He is expressing interest to take her home if patient is in terminal decline.  But goals of care about TPN and consensus on terminal decline all have to align.  At this point in time CCM will sign off    Best Practice (right click and "Reselect all SmartList Selections" daily)  Per [facility]    SIGNATURE    Dr. Brand Males, M.D., F.C.C.P,  Pulmonary and Critical Care Medicine Staff Physician, [place] Director - Interstitial Lung Disease  Program  Pulmonary [place] [facility] [place], [place], [address]  NPI Number:  NPI #[id number] DEA Number: [id number]  Pager: [phone], If no answer  -> Check AMION or Try [phone] Telephone (clinical office): [phone] Telephone (research): [phone]  11:43 AM 05/28/2021    LABS    PULMONARY Recent Labs  Lab 06/08/2021 1253 05/27/21 1907  HCO3 29.1* 27.3  O2SAT 55.2 99.9    CBC Recent Labs  Lab 05/27/21 1720 05/28/21 0527 05/28/21 0800  HGB 9.4* 9.6* 9.9*  HCT 29.4* 29.1* 31.0*  WBC 14.2* 17.5* 20.4*  PLT 150 154 173    COAGULATION Recent Labs  Lab 05/27/21 1720  INR 1.5*    CARDIAC  No results for input(s): TROPONINI in the last 168 hours. No results for input(s): PROBNP in the last 168 hours.   CHEMISTRY Recent Labs  Lab 05/25/21 0359 05/31/2021 0539 05/27/21 0529 05/27/21 1720 05/28/21 0600  NA 136 135 137 137 136  K 3.8 3.8 4.5 3.2* 3.6  CL 103 99 101 102 100  CO2 27 28 24 27 27   GLUCOSE 110* 109* 158* 129* 116*  BUN 27* 26* 34* 42* 43*  CREATININE 0.54 0.57 0.77 0.67 0.76  CALCIUM 8.9 9.3 9.5 8.9 9.6  MG  --   --   --  2.0  --   PHOS  --   --   --  4.6  --    Estimated Creatinine Clearance: 39.6 mL/min (by C-G formula based on SCr of 0.76 mg/dL).   LIVER Recent Labs  Lab 05/17/2021 2047 05/27/21 1720 05/28/21 0600  AST  --  38 37  ALT  --  14 15  ALKPHOS  --  96 94  BILITOT  --  1.4* 1.8*  PROT 6.5 6.2* 6.0*  ALBUMIN  --  2.9* 2.9*  INR  --  1.5*  --      INFECTIOUS Recent Labs  Lab 05/27/21 1720  LATICACIDVEN 2.6*  PROCALCITON 0.51     ENDOCRINE CBG (last 3)  No results for input(s): GLUCAP in the last 72 hours.       IMAGING x48h  - image(s) personally visualized  -   highlighted in bold DG CHEST PORT 1 VIEW  Result Date: 05/27/2021 CLINICAL DATA:  Acute on chronic respiratory failure. Cough. COPD. Silent aspiration. EXAM: PORTABLE CHEST 1 VIEW COMPARISON:  05/27/2021 FINDINGS: Heart size is within normal limits. Pacemaker and  right-sided power port remain in appropriate position. Aortic atherosclerotic calcification noted. Asymmetric masslike opacity is again noted in the right hilar region. Small layering right pleural effusion is unchanged. Left apical pleural-parenchymal scarring again noted. Thoracic dextroscoliosis again seen. IMPRESSION:  No significant change in small layering right pleural effusion. Asymmetric masslike opacity in right hilar region. Stable left apical pleural-parenchymal scarring. Electronically Signed   By: Marlaine Hind M.D.   On: 05/27/2021 14:25   DG CHEST PORT 1 VIEW  Result Date: 06/03/2021 CLINICAL DATA:  Status post thoracentesis EXAM: PORTABLE CHEST 1 VIEW COMPARISON:  Previous studies including the examination of 06/11/2021 FINDINGS: Cardiac size is within normal limits. Biventricular pacer leads are noted in place. Tip of right IJ chest port is seen at the junction of superior vena cava and right atrium. Increased interstitial markings are seen in the left upper lung fields, right upper right mid and right lower lung fields. There is blunting of right lateral CP angle. There is questionable minimal blunting of left lateral CP angle. Asymmetric pleural density is seen in the right apex with no significant change. There is no pneumothorax. IMPRESSION: Increased interstitial markings are seen in both lungs, more so on the right side which may suggest underlying scarring and possibly superimposed pneumonia. There is interval decrease in patchy infiltrates in the right upper lung fields. There is small right pleural effusion. Possible minimal left pleural effusion. Electronically Signed   By: Elmer Picker M.D.   On: 05/31/2021 16:19   ECHOCARDIOGRAM LIMITED  Result Date: 05/28/2021    ECHOCARDIOGRAM LIMITED REPORT   Patient Name:   GWENEVERE GOGA Date of Exam: 05/28/2021 Medical Rec #:  [id number]       Height:       60.0 in Accession #:    [id number]      Weight:       92.4 lb Date of Birth:   12/23/1945       BSA:          1.344 m Patient Age:    46 years        BP:           115/88 mmHg Patient Gender: F               HR:           117 bpm. Exam Location:  Inpatient Procedure: Limited Echo, Cardiac Doppler and Color Doppler Indications:    Elevated Troponin  History:        Patient has prior history of Echocardiogram examinations, most                 recent 01/21/2013. Chronic diastolic CHF, Pacemaker, COPD and                 Bronchiectasis; Arrythmias:Atrial Fibrillation. Possible right                 upper lobe pneumonia. Suspected but not confirmed metastatic                 lung cancer. Symptomatic anemia.  Sonographer:    Darlina Sicilian RDCS Referring Phys: [id number] CHRISTOPHER P DANFORD IMPRESSIONS  1. Left ventricular ejection fraction, by estimation, is 65 to 70%. The left ventricle has normal function. The left ventricle has no regional wall motion abnormalities. There is moderate concentric left ventricular hypertrophy. Left ventricular diastolic function could not be evaluated. There is the interventricular septum is flattened in systole, consistent with right ventricular pressure overload.  2. Right ventricular systolic function is normal. The right ventricular size is moderately enlarged. There is moderately elevated pulmonary artery systolic pressure. The estimated right ventricular systolic pressure is 97.0 mmHg.  3. The mitral valve is grossly normal. Trivial  mitral valve regurgitation. No evidence of mitral stenosis.  4. Tricuspid valve regurgitation is moderate.  5. The aortic valve is tricuspid. There is mild calcification of the aortic valve. There is mild thickening of the aortic valve. Aortic valve regurgitation is not visualized. Aortic valve sclerosis/calcification is present, without any evidence of aortic stenosis.  6. The inferior vena cava is normal in size with <50% respiratory variability, suggesting right atrial pressure of 8 mmHg. Comparison(s): Changes from prior study  are noted. Conclusion(s)/Recommendation(s): RV moderately enlarged based on visual comparison to LV, appears to have normal function. Moderate TR with elevated RVSP. LV function is normal to hyperdynamic without focal wall motion abnormalities. FINDINGS  Left Ventricle: Left ventricular ejection fraction, by estimation, is 65 to 70%. The left ventricle has normal function. The left ventricle has no regional wall motion abnormalities. The left ventricular internal cavity size was small. There is moderate  concentric left ventricular hypertrophy. The interventricular septum is flattened in systole, consistent with right ventricular pressure overload. Left ventricular diastolic function could not be evaluated. Right Ventricle: The right ventricular size is moderately enlarged. Right ventricular systolic function is normal. There is moderately elevated pulmonary artery systolic pressure. The tricuspid regurgitant velocity is 3.12 m/s, and with an assumed right atrial pressure of 8 mmHg, the estimated right ventricular systolic pressure is 96.2 mmHg. Pericardium: There is no evidence of pericardial effusion. Mitral Valve: Chordal calcification. The mitral valve is grossly normal. Trivial mitral valve regurgitation. No evidence of mitral valve stenosis. Tricuspid Valve: The tricuspid valve is normal in structure. Tricuspid valve regurgitation is moderate . No evidence of tricuspid stenosis. Aortic Valve: The aortic valve is tricuspid. There is mild calcification of the aortic valve. There is mild thickening of the aortic valve. There is mild aortic valve annular calcification. Aortic valve regurgitation is not visualized. Aortic valve sclerosis/calcification is present, without any evidence of aortic stenosis. Pulmonic Valve: The pulmonic valve was not well visualized. Pulmonic valve regurgitation is not visualized. No evidence of pulmonic stenosis. Aorta: The aortic root and ascending aorta are structurally normal, with  no evidence of dilitation. Venous: The inferior vena cava is normal in size with less than 50% respiratory variability, suggesting right atrial pressure of 8 mmHg. Additional Comments: A device lead is visualized in the right atrium and right ventricle. LEFT VENTRICLE PLAX 2D LVOT diam:     1.70 cm LV SV:         34 LV SV Index:   25 LVOT Area:     2.27 cm  AORTIC VALVE LVOT Vmax:   111.00 cm/s LVOT Vmean:  67.100 cm/s LVOT VTI:    0.150 m  AORTA Ao Asc diam: 3.20 cm TRICUSPID VALVE TR Peak grad:   38.9 mmHg TR Vmax:        312.00 cm/s  SHUNTS Systemic VTI:  0.15 m Systemic Diam: 1.70 cm Buford Dresser MD Electronically signed by Buford Dresser MD Signature Date/Time: 05/28/2021/8:50:57 AM    Final

## 2021-05-29 ENCOUNTER — Encounter: Payer: Self-pay | Admitting: *Deleted

## 2021-05-29 ENCOUNTER — Encounter (HOSPITAL_COMMUNITY): Payer: Self-pay | Admitting: Pulmonary Disease

## 2021-05-29 DIAGNOSIS — R918 Other nonspecific abnormal finding of lung field: Secondary | ICD-10-CM | POA: Diagnosis not present

## 2021-05-29 DIAGNOSIS — J9620 Acute and chronic respiratory failure, unspecified whether with hypoxia or hypercapnia: Secondary | ICD-10-CM | POA: Diagnosis not present

## 2021-05-29 LAB — COMPREHENSIVE METABOLIC PANEL
ALT: 14 U/L (ref 0–44)
AST: 41 U/L (ref 15–41)
Albumin: 2.9 g/dL — ABNORMAL LOW (ref 3.5–5.0)
Alkaline Phosphatase: 97 U/L (ref 38–126)
Anion gap: 10 (ref 5–15)
BUN: 42 mg/dL — ABNORMAL HIGH (ref 8–23)
CO2: 25 mmol/L (ref 22–32)
Calcium: 9.5 mg/dL (ref 8.9–10.3)
Chloride: 100 mmol/L (ref 98–111)
Creatinine, Ser: 0.7 mg/dL (ref 0.44–1.00)
GFR, Estimated: >60 mL/min (ref >60)
Glucose, Bld: 143 mg/dL — ABNORMAL HIGH (ref 70–99)
Potassium: 3.5 mmol/L (ref 3.5–5.1)
Sodium: 135 mmol/L (ref 135–145)
Total Bilirubin: 1.6 mg/dL — ABNORMAL HIGH (ref 0.3–1.2)
Total Protein: 6.2 g/dL — ABNORMAL LOW (ref 6.5–8.1)

## 2021-05-29 LAB — CBC
HCT: 28.8 % — ABNORMAL LOW (ref 36.0–46.0)
Hemoglobin: 9.2 g/dL — ABNORMAL LOW (ref 12.0–15.0)
MCH: 30.1 pg (ref 26.0–34.0)
MCHC: 31.9 g/dL (ref 30.0–36.0)
MCV: 94.1 fL (ref 80.0–100.0)
Platelets: 145 10*3/uL — ABNORMAL LOW (ref 150–400)
RBC: 3.06 MIL/uL — ABNORMAL LOW (ref 3.87–5.11)
RDW: 22.6 % — ABNORMAL HIGH (ref 11.5–15.5)
WBC: 22 10*3/uL — ABNORMAL HIGH (ref 4.0–10.5)
nRBC: 4.3 % — ABNORMAL HIGH (ref 0.0–0.2)

## 2021-05-29 LAB — PATHOLOGIST SMEAR REVIEW

## 2021-05-29 LAB — MAGNESIUM: Magnesium: 2.3 mg/dL (ref 1.7–2.4)

## 2021-05-29 LAB — LACTIC ACID, PLASMA: Lactic Acid, Venous: 1.6 mmol/L (ref 0.5–1.9)

## 2021-05-29 LAB — PHOSPHORUS: Phosphorus: 5.6 mg/dL — ABNORMAL HIGH (ref 2.5–4.6)

## 2021-05-29 MED ORDER — LORAZEPAM 0.5 MG PO TABS: 0.5000 mg | ORAL_TABLET | Freq: Four times a day (QID) | ORAL | Status: DC | PRN

## 2021-05-29 MED ORDER — LORAZEPAM 2 MG/ML IJ SOLN
0.5000 mg | Freq: Four times a day (QID) | INTRAMUSCULAR | Status: DC | PRN
  Administered 2021-05-30 – 2021-05-31 (×4): 0.5 mg via INTRAVENOUS
  Filled 2021-05-29 (×4): qty 1

## 2021-05-29 MED ORDER — MORPHINE SULFATE (PF) 2 MG/ML IV SOLN
1.0000 mg | INTRAVENOUS | Status: DC | PRN
  Administered 2021-05-30: 1 mg via INTRAVENOUS
  Administered 2021-05-30 – 2021-05-31 (×9): 2 mg via INTRAVENOUS
  Filled 2021-05-29 (×10): qty 1

## 2021-05-29 MED ORDER — MORPHINE SULFATE (PF) 2 MG/ML IV SOLN
1.0000 mg | INTRAVENOUS | Status: DC | PRN
  Administered 2021-05-29: 2 mg via INTRAVENOUS
  Filled 2021-05-29: qty 1

## 2021-05-29 NOTE — Progress Notes (Signed)
Chaplain engaged in an initial visit with [name] and her brother earlier today.  Brother voiced that he is a Theme park manager. Chaplain offered support by building rapport with Brother through conversation and getting to know them.  He shared that they are from [place], [place] though he moved away to [place].  He is ten years younger than [name] and sees her as a second mom.  [name] has raised several children who she adopted.  She valued being a caretaker and was able to care for her children with special needs.  [name] has a biological daughter coming from [place] today.  ? ?Chaplain offered listening, a compassionate presence, and was able to bring [name] some fresh water.   ? ? ? 05/29/21 1400  ?Clinical Encounter Type  ?Visited With Patient and family together  ?Visit Type Initial;Spiritual support  ?Referral From Palliative care team  ?Consult/Referral To Chaplain  ? ? ?

## 2021-05-29 NOTE — Progress Notes (Signed)
PT Cancellation Note ? ?Patient Details ?Name: [name] ?MRN: [id number] ?DOB: 1945-05-14 ? ? ?Cancelled Treatment:     per RN, Pt is now "Comfort measures".   ? ? ?[name] ?05/29/2021, 1:18 PM ? ? ?

## 2021-05-29 NOTE — Progress Notes (Signed)
Daily Progress Note   Patient Name: [name]       Date: 05/29/2021 DOB: 07-Dec-1945  Age: 76 y.o. MRN#: [id number] Attending Physician: Edwin Dada, * Primary Care Physician: Carollee Herter, Alferd Apa, DO Admit Date: 05/31/2021  Reason for Consultation/Follow-up: Establishing goals of care  Subjective: I saw and examined [name] today.  She was lying in bed in no distress at time of my encounter, however, she is noted to have significant decline today and is now requiring facemask to maintain oxygen saturation.  I met today with her husband, brother, sister-in-law, and son in conjunction with Dr. Loleta Books.  We discussed her acute decline and family understands she is appears to be quickly approaching end-of-life.  The hope is that her daughter who is coming from [place] will get here to spend time with her prior to her passing.  We discussed plan to continue with interventions that focus on comfort and are likely to support her goal of living well enough to see her daughter while discontinuing interventions that are not going to promote her comfort or support short-term goal of her living long enough to spend time with family, particularly her daughter coming from out of town later today.   Length of Stay: 10  Current Medications: Scheduled Meds:   acetaminophen  1,000 mg Oral TID   Chlorhexidine Gluconate Cloth  6 each Topical Daily   collagenase   Topical Daily   feeding supplement  1 Container Oral BID BM   furosemide  20 mg Intravenous Once   multivitamin with minerals  1 tablet Oral Daily   nutrition supplement (JUVEN)  1 packet Oral BID BM   nystatin   Topical TID   sodium chloride flush  10-40 mL Intracatheter Q12H    Continuous Infusions:  ceFEPime (MAXIPIME)  IV Stopped (05/29/21 1556)   dextrose 30 mL/hr at 05/29/21 1600    PRN Meds: albuterol, hydrALAZINE, LORazepam **OR** LORazepam, morphine injection, ondansetron **OR** ondansetron (ZOFRAN) IV, oxyCODONE **AND** [DISCONTINUED] acetaminophen, prochlorperazine, sodium chloride flush, zolpidem  Physical Exam        General: Sleepy, in no acute distress.  Appears more frail and weak and does not meaningfully interact today. Heart: Tachycardic  lungs: Decreased Ext: No significant edema Skin: Warm and dry  Vital Signs: BP (!) 107/52 (BP Location:  Right Arm)    Pulse (!) 116    Temp 97.7 F (36.5 C) (Oral)    Resp 14    Ht 5' (1.524 m)    Wt 41.9 kg    SpO2 96%    BMI 18.04 kg/m  SpO2: SpO2: 96 % O2 Device: O2 Device: Venturi Mask O2 Flow Rate: O2 Flow Rate (L/min): 14 L/min  Intake/output summary:  Intake/Output Summary (Last 24 hours) at 05/29/2021 2036 Last data filed at 05/29/2021 1844 Gross per 24 hour  Intake 1098.67 ml  Output 300 ml  Net 798.67 ml    LBM: Last BM Date : 05/27/21 Baseline Weight: Weight: 38.6 kg Most recent weight: Weight: 41.9 kg       Palliative Assessment/Data:    Flowsheet Rows    Flowsheet Row Most Recent Value  Intake Tab   Referral Department Hospitalist  Unit at Time of Referral Oncology Unit  Palliative Care Primary Diagnosis Cancer  Date Notified 05/24/21  Palliative Care Type New Palliative care  Reason for referral Clarify Goals of Care  Date of Admission 05/27/2021  Date first seen by Palliative Care 05/25/21  # of days Palliative referral response time 1 Day(s)  # of days IP prior to Palliative referral 6  Clinical Assessment   Palliative Performance Scale Score 40%  Psychosocial & Spiritual Assessment   Palliative Care Outcomes   Patient/Family meeting held? Yes  Who was at the meeting? Patient       Patient Active Problem List   Diagnosis Date Noted   Adhesions of intestine, very dense, with frozen abdomen 12/11/2011    Hyperlipidemia LDL goal <100 07/08/2017   Insomnia 05/27/2014   Possible right upper lobe pneumonia 06/03/2021   Myocardial injury 05/28/2021   Symptomatic anemia 06/09/2021   Mass of upper lobe of right lung with suspected lytic lesions of bone 05/25/2021   Adenocarcinoma, colon ([place]) 05/24/2021   Paroxysmal atrial fibrillation ([place]) 12/08/2007   DNR (do not resuscitate) 05/28/2021   Goals of care, counseling/discussion 05/28/2021   Palliative care patient 05/28/2021   Adenopathy 06/02/2021   Mediastinal adenopathy    Pleural effusion    Unstageable pressure ulcer of sacral region ([place]) 05/19/2021   Acute and chronic respiratory failure (acute-on-chronic) ([place]) 05/19/2021   Community acquired pneumonia of right lung    Recurrent falls 06/07/2021   Hematoma 08/07/2019   Other neutropenia ([place])    Post-procedural fever 07/20/2019   Acute febrile illness 07/16/2019   Pulmonary MAI (mycobacterium avium-intracellulare) infection ([place]) 06/24/2019   History of total right hip replacement 05/27/2019   NICM (nonischemic cardiomyopathy) ([place]) 05/19/2019   Educated about COVID-19 virus infection 09/07/2018   Erythropoietin deficiency anemia 06/19/2018   Swelling of calf 02/04/2018   Bad odor of urine 12/09/2017   Iron deficiency anemia due to chronic blood loss 08/14/2017   Iron malabsorption 08/14/2017   Elevated ALT measurement    Hypotension 06/20/2017   HLD (hyperlipidemia) 06/20/2017   CAD (coronary artery disease) 06/20/2017   Protein-calorie malnutrition, severe ([place]) 06/20/2017   Generalized weakness    Weakness 05/30/2017   Focal neurological deficit 05/30/2017   Chronic diastolic CHF (congestive heart failure) ([place]) 05/30/2017   TIA (transient ischemic attack) 05/30/2017   AKI (acute kidney injury) ([place]) 05/30/2017   Abnormal LFTs 05/30/2017   Bronchiectasis ([place]) 04/22/2017   Right otitis media 07/17/2016   Cerumen impaction 07/17/2016   Osteoporosis 03/16/2016    Headache 11/20/2014   Skin infection 05/27/2014   Abdominal wall pain 07/09/2013  Other dysphagia 02/02/2013   Platelets decreased ([place]) 01/29/2012   Vitamin D deficiency 12/13/2011   Hypokalemia 12/13/2011   Hypocalcemia 12/12/2011   SBO (small bowel obstruction), recurrent 12/11/2011   Protein-calorie malnutrition, moderate ([place]) 12/11/2011   Oxygen dependent    Hx of gastric ulcer    UTI (urinary tract infection) 10/15/2011   Allergic conjunctivitis 10/15/2011   Degenerative arthritis of hip 08/03/2011   B12 deficiency 07/02/2011   Bilateral leg weakness 01/16/2011   Anemia, chronic disease 09/14/2010   Recurrent pneumonia 08/11/2010   CRT- [employer] 06/26/2010   DYSURIA 05/19/2010   Chronic hypoxemic respiratory failure ([place]) 02/21/2010   DEHYDRATION 01/06/2010   GASTROENTERITIS 01/06/2010   Congestive dilated cardiomyopathy ([place]) 11/17/2009   LBBB 09/32/3557   SYSTOLIC HEART FAILURE, CHRONIC 11/17/2009   GERD (gastroesophageal reflux disease) 02/17/2009   NONSPECIFIC ABN FINDNG RAD&OTH EXAM BILARY TRCT 09/03/2008   COPD with emphysema ([place]) 06/28/2008   DIVERTICULOSIS, COLON 06/03/2008   GASTRITIS 06/02/2008   GASTROPARESIS 08/07/2007   NAUSEA, CHRONIC 08/07/2007   Abdominal pain 08/07/2007   SYNCOPE 02/17/2007   RESTLESS LEG SYNDROME 08/01/2006   Chronic pain syndrome 08/01/2006   OSTEOARTHRITIS 07/31/2006   Degenerative disc disease, lumbar 07/31/2006   Fibromyalgia 07/31/2006   CHRONIC FATIGUE SYNDROME 07/31/2006   URINARY INCONTINENCE 07/31/2006   ANEMIA-IRON DEFICIENCY 05/01/2006   Essential hypertension 05/01/2006    Palliative Care Assessment & Plan   Patient Profile: 76 y.o. female  with past medical history of COPD/emphysema/MAI on 4 L O2 at home, new pulmonary mass admitted, anemia on erythropoietin followed by Dr. Marin Olp, A-fib on Eliquis, multiple prior abdominal surgeries with significant colon resections, hypertension, CAD, GERD, CHF,  fibromyalgia and pacemaker on 06/10/2021 with increased weakness and dark stool.  She had EGD that was revealing for gastritis and then had colonoscopy that revealed adenocarcinoma with signet ring cell features at the anastomosis of prior surgery.  She has also undergone bronchoscopic evaluation and biopsy for lung mass. palliative consulted for goals of care.  Recommendations/Plan: DNR/DNI Overall goal moving forward is to focus on comfort, but were going to continue with gentle interventions without escalating care to try and support her while awaiting daughter to arrive from [place].  Code Status:    Code Status Orders  (From admission, onward)           Start     Ordered   05/27/21 1807  Do not attempt resuscitation (DNR)  Continuous       Question Answer Comment  In the event of cardiac or respiratory ARREST Do not call a code blue   In the event of cardiac or respiratory ARREST Do not perform Intubation, CPR, defibrillation or ACLS   In the event of cardiac or respiratory ARREST Use medication by any route, position, wound care, and other measures to relive pain and suffering. May use oxygen, suction and manual treatment of airway obstruction as needed for comfort.      05/27/21 1807           Code Status History     Date Active Date Inactive Code Status Order ID Comments User Context   05/17/2021 2104 05/27/2021 1807 Full Code [id number]  Mariel Aloe, MD Inpatient   07/20/2019 2012 07/23/2019 1615 Full Code [id number]  Rise Patience, MD Inpatient   07/16/2019 0211 07/18/2019 2137 Full Code [id number]  Etta Quill, DO ED   06/19/2019 1405 06/19/2019 2019 Full Code [id number]  Deboraha Sprang, MD Inpatient   06/20/2017  5001 07/02/2017 1623 Full Code [id number]  Ivor Costa, MD ED   05/31/2017 0012 06/01/2017 1918 Full Code [id number]  Ivor Costa, MD Inpatient   05/17/2014 1633 05/23/2014 1619 Full Code [id number]  Cristal Ford, DO Inpatient   12/11/2011 2340 12/18/2011 1618 Full  Code [id number]  Ruben Gottron, RN Inpatient   08/03/2011 1247 08/07/2011 1611 Full Code [id number]  Arletha Pili, RN Inpatient      Advance Directive Documentation    Flowsheet Row Most Recent Value  Type of Advance Directive Living will  Pre-existing out of facility DNR order (yellow form or pink MOST form) --  "MOST" Form in Place? --       Prognosis: Hours to days  Discharge Planning: Anticipated Hospital Death  Care plan was discussed with patient and her family including husband, son, goddaughter  Thank you for allowing the Palliative Medicine Team to assist in the care of this patient.   Total Time 50 Prolonged Time Billed No   Micheline Rough, MD  Please contact Palliative Medicine Team phone at [phone] for questions and concerns.

## 2021-05-29 NOTE — Progress Notes (Signed)
?Progress Note ? ? ?Patient: [name] XBJ:[id number] DOB: [birth year] DOA: 06/09/2021     10 ?DOS: the patient was seen and examined on 05/29/2021 ?  ? ? ? ?Brief hospital course: ?[name] is a 76 y.o. F with permAF on Eliquis, COPD/bronchiectasis/MAI and chronic respiratory failure on 4L at home, CAD, hx PPM, chronic pain and dCHF who presented with weakness and back pain.  Also found incidentally to report melena and have FOBT in the ER.   ? ? ?3/2: Admitted, started on PPI given melena, transfused 2 units; given antibiotics given multifocal PNA on CXR ?3/3: GI consulted for melena; CT chest showed RUL mass, loculated effusion and lytic lesions scattered in bone --> Pulmonology consulted ?3/4: EGD significant for gastritis only  ?3/7: Colonoscopy showed a subtle mucosal irregularity at her anastomosis, biopsied, but no definite source of bleeding ?3/8: Biopsy from colonoscopy resulted adenoCA ?3/9: Gen Surg, Palliative Care and Oncology consulted ?3/10: Bronchoscopic biopsy of mediastinal lymph node by Pulmonology ?3/11: More tachycardia overnight to 120s, 130s, very weak today --> troponin 1000?, started heparin, continue limited scope care but code status changed to DNR ? ? ? ? ?Assessment and Plan: ?* Suspected metastatic lung cancer now in terminal decline ?Patient was initially admitted and treated for acute blood loss anemia with transfusion and PPI and antibiotics for possible pneumonia.   ? ?Evaluated by GI with endoscopy and colonoscopy.  There were relatively benign except the unexpected finding of small anastomotic/marginal luminal irregularity which turned out to be adenoCA on biopsy. ? ?Evaluated by Pulmonology who recommended empiric antibiotics and follow up for outpatient PET.   ? ?The patient had subsequent failure to thrive, her weakness progressed and did not improve.  She was hopeful to progress to SNF for STR, and decision was made with Oncology and Pulmonology to obtain biopsy to guide  potential treatment on 3/10. Antibiotics were broadened, IV fluids were given.  She was evaluated for worsening infection, cardiac dysfunction, without clues and it was apparent that her deterioration was due to the likely widely metastatic cancer.   ? ?Met with patient, husband, brother and son [name] today.  Patient too lethargic to interact.  Family in agreement that she is likely terminal and we expect an in hospital death, probably within hours to a few days. ? ? ?-Continue cefepime, dextrose infusion ?-All comfort measures that can be safely given ?-Family to bedside ? ? ? ? ? ? ? ? ?  ? ?Subjective: Patietn too lethargic to respond.  Tachycardic and weak. ? ?Physical Exam: ?Vitals:  ? 05/28/21 2012 05/29/21 0134 05/29/21 0418 05/29/21 0944  ?BP: 107/70 (!) 97/59 134/76 (!) 107/52  ?Pulse: (!) 116 (!) 113 (!) 120 (!) 116  ?Resp: '16 16 14 14  ' ?Temp: 98.9 ?F (37.2 ?C) 97.7 ?F (36.5 ?C) 98.4 ?F (36.9 ?C) 97.7 ?F (36.5 ?C)  ?TempSrc: Oral Oral Oral Oral  ?SpO2: 99% 94% 92% 96%  ?Weight:      ?Height:      ? ?Frail elderly female, pale, lying in bed, opens eyes briefly to family.  Tachycardic, cachectic.  Severe weakness in all four extremities. ? ? ?Data Reviewed: ?There are no new results to review at this time. ?Patient goals of care re-evaluated, we will make comfortable, no escalation of care. ? ?Family Communication: Husband, brother, son at bedside ? ?Disposition: ?Status is: Inpatient ?Remains inpatient appropriate because: She is in terminal decline, likely at end of life in hours to a few days. ? ? ? ? ? ?  Author: ?Edwin Dada, MD ?05/29/2021 4:22 PM ? ?For on call review [email or web address].  ?

## 2021-05-29 NOTE — Progress Notes (Signed)
Patient is still admitted. She has since had a CT scan which indicates the possibility of a second primary. Patient had an additional biopsy on 05/17/2021. Dr Marin Olp would like to receive these results and likely send for molecular studies. He doesn't want to see that patient until those results have returned. New Patient appointment rescheduled.  ? ?Will monitor for path results from bronch and send for molecular studies as requested.  ? ?Oncology Nurse Navigator Documentation ? ?Oncology Nurse Navigator Flowsheets 05/29/2021  ?Abnormal Finding Date -  ?Confirmed Diagnosis Date -  ?Diagnosis Status -  ?Navigator Follow Up Date: 05/30/2021  ?Navigator Follow Up Reason: Pathology  ?Navigator Location [facility]  ?Referral Date to RadOnc/MedOnc -  ?Navigator Encounter Type Appt/Treatment Plan Review  ?Patient Visit Type MedOnc  ?Treatment Phase Pre-Tx/Tx Discussion  ?Barriers/Navigation Needs Coordination of Care;Education;Morbidities/Frailty  ?Interventions Coordination of Care  ?Acuity Level 2-Minimal Needs (1-2 Barriers Identified)  ?Coordination of Care Appts  ?Support Groups/Services Friends and Family  ?Time Spent with Patient 15  ?  ?

## 2021-05-29 NOTE — Progress Notes (Addendum)
Hypoxic with saturations in the 70's on 2L upon vital sign assessment. Placed on venturi mask. No signs of distress but Dr. Loleta Books and Dr. Domingo Cocking had been made aware of lethargy on assessment this AM. Pt recovered quickly with the supplemental oxygen. Charge, rapid, and MDs aware of red mews score. Team to have palliate meeting with the family shortly to decide continued treatment plan.  ?

## 2021-05-29 NOTE — Anesthesia Postprocedure Evaluation (Signed)
Anesthesia Post Note ? ?Patient: DYANE BROBERG ? ?Procedure(s) Performed: ENDOBRONCHIAL ULTRASOUND (Bilateral) ?THORACENTESIS ?FINE NEEDLE ASPIRATION ? ?  ? ?Patient location during evaluation: PACU ?Anesthesia Type: General ?Level of consciousness: awake and alert ?Pain management: pain level controlled ?Vital Signs Assessment: post-procedure vital signs reviewed and stable ?Respiratory status: spontaneous breathing, nonlabored ventilation, respiratory function stable and patient connected to nasal cannula oxygen ?Cardiovascular status: blood pressure returned to baseline and stable ?Postop Assessment: no apparent nausea or vomiting ?Anesthetic complications: no ? ? ?No notable events documented. ? ?Last Vitals:  ?Vitals:  ? 05/29/21 0134 05/29/21 0418  ?BP: (!) 97/59 134/76  ?Pulse: (!) 113 (!) 120  ?Resp: 16 14  ?Temp: 36.5 ?C 36.9 ?C  ?SpO2: 94% 92%  ?  ?Last Pain:  ?Vitals:  ? 05/29/21 0418  ?TempSrc: Oral  ?PainSc:   ? ? ?  ?  ?  ?  ?  ?  ? ?Suzette Battiest E ? ? ? ? ?

## 2021-05-29 NOTE — Progress Notes (Signed)
Chaplain provided follow up support to [name] and [name].  Family is wanting for Becky to be able to rest so that she still has energy to talk with Becky daughter when she arrives from [place] this evening.  [name] was having some pain.  Chaplain notified RN.  Provided reflective listening for family. ? ?[employer], Bcc ?Pager, [phone] ? ?

## 2021-05-30 ENCOUNTER — Encounter: Payer: Self-pay | Admitting: *Deleted

## 2021-05-30 DIAGNOSIS — Z515 Encounter for palliative care: Secondary | ICD-10-CM | POA: Diagnosis not present

## 2021-05-30 DIAGNOSIS — J9620 Acute and chronic respiratory failure, unspecified whether with hypoxia or hypercapnia: Secondary | ICD-10-CM | POA: Diagnosis not present

## 2021-05-30 DIAGNOSIS — Z7189 Other specified counseling: Secondary | ICD-10-CM | POA: Diagnosis not present

## 2021-05-30 LAB — CBC
HCT: 25.9 % — ABNORMAL LOW (ref 36.0–46.0)
Hemoglobin: 8.1 g/dL — ABNORMAL LOW (ref 12.0–15.0)
MCH: 29.7 pg (ref 26.0–34.0)
MCHC: 31.3 g/dL (ref 30.0–36.0)
MCV: 94.9 fL (ref 80.0–100.0)
Platelets: 102 10*3/uL — ABNORMAL LOW (ref 150–400)
RBC: 2.73 MIL/uL — ABNORMAL LOW (ref 3.87–5.11)
RDW: 23.5 % — ABNORMAL HIGH (ref 11.5–15.5)
WBC: 16.3 10*3/uL — ABNORMAL HIGH (ref 4.0–10.5)
nRBC: 3.5 % — ABNORMAL HIGH (ref 0.0–0.2)

## 2021-05-30 LAB — BODY FLUID CULTURE W GRAM STAIN
Culture: NO GROWTH
Gram Stain: NONE SEEN

## 2021-05-30 MED ORDER — GLYCOPYRROLATE 0.2 MG/ML IJ SOLN: 0.1000 mg | Freq: Once | INTRAMUSCULAR | Status: DC

## 2021-05-30 NOTE — Progress Notes (Signed)
Patient's care has transitioned to comfort care due to decline in status. Per Palliative care her prognosis is hours to days. Will discontinue navigation. Dr Marin Olp notified.  ? ?Oncology Nurse Navigator Documentation ? ?Oncology Nurse Navigator Flowsheets 05/30/2021  ?Abnormal Finding Date -  ?Confirmed Diagnosis Date -  ?Diagnosis Status -  ?Navigator Follow Up Date: -  ?Navigator Follow Up Reason: -  ?Navigation Complete Date: 05/30/2021  ?Post Navigation: Continue to Follow Patient? No  ?Reason Not Navigating Patient: Hospice/Death  ?Navigator Location [facility]  ?Referral Date to RadOnc/MedOnc -  ?Navigator Encounter Type Appt/Treatment Plan Review  ?Patient Visit Type MedOnc  ?Treatment Phase Pre-Tx/Tx Discussion  ?Barriers/Navigation Needs -  ?Interventions -  ?Acuity -  ?Coordination of Care -  ?Support Groups/Services -  ?Time Spent with Patient 15  ?  ?

## 2021-05-30 NOTE — Progress Notes (Signed)
?  Progress Note ? ? ?Patient: [name] TWS:[id number] DOB: 08-Apr-1945 DOA: 06/06/2021     11 ?DOS: the patient was seen and examined on 05/30/2021 ?  ? ? ? ?Brief hospital course: ?[name] is a 76 y.o. F with permAF on Eliquis, COPD/bronchiectasis/MAI and chronic respiratory failure on 4L at home, CAD, hx PPM, chronic pain and dCHF who presented with weakness and back pain.  Also found incidentally to report melena and have FOBT in the ER.   ? ? ?3/2: Admitted, started on PPI given melena, transfused 2 units; given antibiotics given multifocal PNA on CXR ?3/3: GI consulted for melena; CT chest showed RUL mass, loculated effusion and lytic lesions scattered in bone --> Pulmonology consulted ?3/4: EGD significant for gastritis only  ?3/7: Colonoscopy showed a subtle mucosal irregularity at her anastomosis, biopsied, but no definite source of bleeding ?3/8: Biopsy from colonoscopy resulted adenoCA ?3/9: Gen Surg, Palliative Care and Oncology consulted ?3/10: Bronchoscopic biopsy of mediastinal lymph node by Pulmonology ?3/11: More tachycardia overnight to 120s, 130s, very weak today, continue limited scope care but code status changed to DNR ?3/12: Worsening again, mentation worse ?3/13: Weaker, mentation poor ? ? ? ? ?Assessment and Plan: ?* Suspected metastatic lung cancer now in terminal decline ?See summary from 3/13 ? ? ?- Stop cefepime ?- Stop dextrose ?- Continue O2 ?- Continue all Ativan, morphine and comfort measures that can be safely given ?- Family to bedside, expect in hospital death in hours to days ? ? ? ? ? ? ? ? ?  ? ?Subjective: She is occasionally able to interact with family, but is otherwise still too lethargic and weak to respond, seems weaker and less alert today. ? ?Physical Exam: ?Vitals:  ? 05/29/21 0134 05/29/21 0418 05/29/21 0944 05/30/21 0610  ?BP: (!) 97/59 134/76 (!) 107/52 125/75  ?Pulse: (!) 113 (!) 120 (!) 116 (!) 108  ?Resp: 16 14 14    ?Temp: 97.7 ?F (36.5 ?C) 98.4 ?F (36.9 ?C)  97.7 ?F (36.5 ?C) 97.7 ?F (36.5 ?C)  ?TempSrc: Oral Oral Oral Oral  ?SpO2: 94% 92% 96% 90%  ?Weight:      ?Height:      ? ?Frail elderly adult female, pale, lethargic.  Stirs minimally to touch.  Heart tachycardic, systolic flow murmur, respirations seem to be slower. ? ? ?Data Reviewed: ?There are no new results to review at this time. ? ?Family Communication: Husband, daughter, sons at the bedside ? ?Disposition: ?Status is: Inpatient ?Remains inpatient appropriate because: She is in terminal decline, likely at end of life in hours to a few days. ? ? ? ? ? ?Author: ?Edwin Dada, MD ?05/30/2021 6:34 PM ? ?For on call review [email or web address].  ?

## 2021-05-30 NOTE — Progress Notes (Signed)
Unfortunately, I just think that [name] is not can be strong enough to tolerate any kind of systemic therapy that we might have to offer her.  She seems to be declining.  She is not eating all that much, if anything.  We have a long talk with her daughter today.  Daughter came up from [place] where she lives.  She is certainly very nice and incredibly knowledgeable and eloquent. ? ?To me, I think we might be looking at comfort care and possibly getting Hospice involved.  I think what may help [place] out in determining this is a prealbumin.  It would not surprise me if her prealbumin is less than 10. ? ?She had some morphine before I came in this morning.  She was resting comfortably. ? ?I think if she is going to need some type of long-acting pain medication, we might think about a Duragesic patch. ? ?I am not sure why a bone scan has not been done on her yet.  Maybe, she just is not able to have a bone scan done. ? ?Her hemoglobin is down to 8.1.  She was transfused over the weekend.  I had to believe that there is still some bleeding going on.  For right now, I would hold off on transfusing her. ? ?Her electrolytes show an albumin of 2.9.  Her BUN is 42 creatinine 0.7.  The elevated BUN could certainly be secondary to her bleeding. ? ?Again, I does have a feeling that regarding have to think about comfort care for [name]. ? ?I am still awaiting the pathology.  I am not sure she would even be able to tolerate immunotherapy or targeted therapy if that would be indicated.  Again, targeted therapy is typically seen in less than 10% of patients who do have lung cancer. ? ?I think her daughter would also be agreeable to getting Hospice involved if we see that she is not going to improve any of her prealbumin is less than 10. ? ?I just hate this for [name].  I know she has had her health issues.  It would not surprise me if she has 2 different malignancies.  What was found in the colon certainly would be  consistent with a colorectal primary.  I would think that metastatic colon cancer would be highly unusual since there is nothing in the liver and that bony metastasis is highly atypical of colon cancer. ? ?I really think that the next couple days will really dictate what we have to recommend for [name]. ? ?She and her daughter have a very strong faith.  We certainly shared that which is nice. ? ?Becky Haw, MD ? ?2 Timothy 4:6-8 ?

## 2021-05-30 NOTE — Progress Notes (Signed)
Physical Therapy Discharge ?Patient Details ?Name: [name] ?MRN: [id number] ?DOB: 1945-10-19 ?Today's Date: 05/30/2021 ?Time:  -  ?  ? ?Patient discharged from PT services secondary to now under comfort care measures. ?Please see latest therapy progress note for current level of functioning and progress toward goals.   ? ?Progress and discharge plan discussed with patient and/or caregiver: N/A ?Tresa Endo PT ?Acute Rehabilitation Services ?Pager [phone] ?Office [phone] ? ? ?GP ?   ? ?Jhoselin Crume, Shella Maxim ?05/30/2021, 7:40 AM  ?

## 2021-05-30 NOTE — Progress Notes (Signed)
? ?                                                                                                                                                     ?                                                   ?Daily Progress Note  ? ?Patient Name: [name]       Date: 05/30/2021 ?DOB: Jan 01, 1946  Age: 76 y.o. MRN#: [id number] ?Attending Physician: Edwin Dada, * ?Primary Care Physician: Carollee Herter, Alferd Apa, DO ?Admit Date: 06/12/2021 ? ?Reason for Consultation/Follow-up: Establishing goals of care ? ?Subjective: ?Appears to be actively dying, family holding vigil at bedside.  ?I met with her husband, sons and several other family members on two occassions today. They are supporting each other during the patient's end of life journey. We talked about continuing comfort care here in the hospital and not transferring the patient to a local hospice house, we also talked about d/c IVF and IV Abx at end of life.  ?Dying signs and symptoms also discussed. Offered my support in a compassionate manner, to the best of my ability.  ?  ?  ?Length of Stay: 11 ? ?Current Medications: ?Scheduled Meds:  ? acetaminophen  1,000 mg Oral TID  ? Chlorhexidine Gluconate Cloth  6 each Topical Daily  ? collagenase   Topical Daily  ? feeding supplement  1 Container Oral BID BM  ? furosemide  20 mg Intravenous Once  ? glycopyrrolate  0.1 mg Intravenous Once  ? multivitamin with minerals  1 tablet Oral Daily  ? nutrition supplement (JUVEN)  1 packet Oral BID BM  ? nystatin   Topical TID  ? sodium chloride flush  10-40 mL Intracatheter Q12H  ? ? ?Continuous Infusions: ? ceFEPime (MAXIPIME) IV 2 g (05/30/21 0400)  ? dextrose 30 mL/hr at 05/30/21 0012  ? ? ?PRN Meds: ?albuterol, hydrALAZINE, LORazepam **OR** LORazepam, morphine injection, ondansetron **OR** ondansetron (ZOFRAN) IV, oxyCODONE **AND** [DISCONTINUED] acetaminophen, prochlorperazine, sodium chloride flush, zolpidem ? ?Physical Exam        ?General: essentially  unresponsive at this point.  ?Heart: Tachycardic  ?lungs: Decreased ?Ext: No significant edema ?Skin: Warm and dry ? ?Vital Signs: BP 125/75   Pulse (!) 108   Temp 97.7 ?F (36.5 ?C) (Oral)   Resp 14   Ht 5' (1.524 m)   Wt 41.9 kg   SpO2 90%   BMI 18.04 kg/m?  ?SpO2: SpO2: 90 % ?O2 Device: O2 Device: Venturi Mask ?O2 Flow Rate: O2 Flow Rate (L/min): 14 L/min ? ?Intake/output summary:  ?Intake/Output Summary (Last 24 hours) at 05/30/2021 1339 ?  Last data filed at 05/30/2021 0012 ?Gross per 24 hour  ?Intake 479.71 ml  ?Output --  ?Net 479.71 ml  ? ? ?LBM: Last BM Date : 05/27/21 ?Baseline Weight: Weight: 38.6 kg ?Most recent weight: Weight: 41.9 kg ? ?     ?Palliative Assessment/Data: ? ? ? ?Flowsheet Rows   ? ?Flowsheet Row Most Recent Value  ?Intake Tab   ?Referral Department Hospitalist  ?Unit at Time of Referral Oncology Unit  ?Palliative Care Primary Diagnosis Cancer  ?Date Notified 05/24/21  ?Palliative Care Type New Palliative care  ?Reason for referral Clarify Goals of Care  ?Date of Admission 05/25/2021  ?Date first seen by Palliative Care 05/25/21  ?# of days Palliative referral response time 1 Day(s)  ?# of days IP prior to Palliative referral 6  ?Clinical Assessment   ?Palliative Performance Scale Score 40%  ?Psychosocial & Spiritual Assessment   ?Palliative Care Outcomes   ?Patient/Family meeting held? Yes  ?Who was at the meeting? Patient  ? ?  ? ? ?Patient Active Problem List  ? Diagnosis Date Noted  ? Myocardial injury 05/28/2021  ? DNR (do not resuscitate) 05/28/2021  ? Goals of care, counseling/discussion 05/28/2021  ? Palliative care patient 05/28/2021  ? Adenopathy 06/14/2021  ? Mediastinal adenopathy   ? Pleural effusion   ? Mass of upper lobe of right lung with suspected lytic lesions of bone 05/25/2021  ? Adenocarcinoma, colon ([place]) 05/24/2021  ? Unstageable pressure ulcer of sacral region ([place]) 05/19/2021  ? Acute and chronic respiratory failure (acute-on-chronic) ([place]) 05/19/2021  ? Community  acquired pneumonia of right lung   ? Symptomatic anemia 06/07/2021  ? Possible right upper lobe pneumonia 06/14/2021  ? Recurrent falls 06/16/2021  ? Hematoma 08/07/2019  ? Other neutropenia ([place])   ? Post-procedural fever 07/20/2019  ? Acute febrile illness 07/16/2019  ? Pulmonary MAI (mycobacterium avium-intracellulare) infection ([place]) 06/24/2019  ? History of total right hip replacement 05/27/2019  ? NICM (nonischemic cardiomyopathy) ([place]) 05/19/2019  ? Educated about COVID-19 virus infection 09/07/2018  ? Erythropoietin deficiency anemia 06/19/2018  ? Swelling of calf 02/04/2018  ? Bad odor of urine 12/09/2017  ? Iron deficiency anemia due to chronic blood loss 08/14/2017  ? Iron malabsorption 08/14/2017  ? Hyperlipidemia LDL goal <100 07/08/2017  ? Elevated ALT measurement   ? Hypotension 06/20/2017  ? HLD (hyperlipidemia) 06/20/2017  ? CAD (coronary artery disease) 06/20/2017  ? Protein-calorie malnutrition, severe ([place]) 06/20/2017  ? Generalized weakness   ? Weakness 05/30/2017  ? Focal neurological deficit 05/30/2017  ? Chronic diastolic CHF (congestive heart failure) ([place]) 05/30/2017  ? TIA (transient ischemic attack) 05/30/2017  ? AKI (acute kidney injury) ([place]) 05/30/2017  ? Abnormal LFTs 05/30/2017  ? Bronchiectasis ([place]) 04/22/2017  ? Right otitis media 07/17/2016  ? Cerumen impaction 07/17/2016  ? Osteoporosis 03/16/2016  ? Headache 11/20/2014  ? Skin infection 05/27/2014  ? Insomnia 05/27/2014  ? Abdominal wall pain 07/09/2013  ? Other dysphagia 02/02/2013  ? Platelets decreased ([place]) 01/29/2012  ? Vitamin D deficiency 12/13/2011  ? Hypokalemia 12/13/2011  ? Hypocalcemia 12/12/2011  ? Adhesions of intestine, very dense, with frozen abdomen 12/11/2011  ? SBO (small bowel obstruction), recurrent 12/11/2011  ? Protein-calorie malnutrition, moderate ([place]) 12/11/2011  ? Oxygen dependent   ? Hx of gastric ulcer   ? UTI (urinary tract infection) 10/15/2011  ? Allergic conjunctivitis 10/15/2011  ?  Degenerative arthritis of hip 08/03/2011  ? B12 deficiency 07/02/2011  ? Bilateral leg weakness 01/16/2011  ? Anemia, chronic  disease 09/14/2010  ? Recurrent pneumonia 08/11/2010  ? CRT- [employer] 06/26/2010  ? DYSURIA 05/19/2010  ? Chronic hypoxemic respiratory failure ([place]) 02/21/2010  ? DEHYDRATION 01/06/2010  ? GASTROENTERITIS 01/06/2010  ? Congestive dilated cardiomyopathy ([place]) 11/17/2009  ? LBBB 11/17/2009  ? SYSTOLIC HEART FAILURE, CHRONIC 11/17/2009  ? GERD (gastroesophageal reflux disease) 02/17/2009  ? NONSPECIFIC ABN FINDNG RAD&OTH EXAM BILARY TRCT 09/03/2008  ? COPD with emphysema ([place]) 06/28/2008  ? DIVERTICULOSIS, COLON 06/03/2008  ? GASTRITIS 06/02/2008  ? Paroxysmal atrial fibrillation ([place]) 12/08/2007  ? GASTROPARESIS 08/07/2007  ? NAUSEA, CHRONIC 08/07/2007  ? Abdominal pain 08/07/2007  ? SYNCOPE 02/17/2007  ? RESTLESS LEG SYNDROME 08/01/2006  ? Chronic pain syndrome 08/01/2006  ? OSTEOARTHRITIS 07/31/2006  ? Degenerative disc disease, lumbar 07/31/2006  ? Fibromyalgia 07/31/2006  ? CHRONIC FATIGUE SYNDROME 07/31/2006  ? URINARY INCONTINENCE 07/31/2006  ? ANEMIA-IRON DEFICIENCY 05/01/2006  ? Essential hypertension 05/01/2006  ? ? ?Palliative Care Assessment & Plan  ? ?Patient Profile: ?76 y.o. female  with past medical history of COPD/emphysema/MAI on 4 L O2 at home, new pulmonary mass admitted, anemia on erythropoietin followed by Dr. Marin Olp, A-fib on Eliquis, multiple prior abdominal surgeries with significant colon resections, hypertension, CAD, GERD, CHF, fibromyalgia and pacemaker on 05/25/2021 with increased weakness and dark stool.  She had EGD that was revealing for gastritis and then had colonoscopy that revealed adenocarcinoma with signet ring cell features at the anastomosis of prior surgery.  She has also undergone bronchoscopic evaluation and biopsy for lung mass. palliative consulted for goals of care. ? ?Recommendations/Plan: ?DNR/DNI ?Comfort measures ?Ok to d/c IVF and IV Abx.   ?Continue supplemental O2 ?Anticipate hospital death ?Prognosis possibly limited to few days in my opinion.  ?  ? ?Code Status: ? ?  ?Code Status Orders  ?(From admission, onward)  ?  ? ? ?  ? ?  Start     Ordered  ? 05/27/21 1807

## 2021-05-30 NOTE — Care Management Important Message (Signed)
Important Message ? ?Patient Details IM Letter placed in Patients room. ?Name: [name] ?MRN: [id number] ?Date of Birth: 1945/11/19 ? ? ?Medicare Important Message Given:  Yes ? ? ? ? ?Kerin Salen ?05/30/2021, 9:24 AM ?

## 2021-05-31 ENCOUNTER — Other Ambulatory Visit: Payer: Self-pay

## 2021-05-31 DIAGNOSIS — Z515 Encounter for palliative care: Secondary | ICD-10-CM

## 2021-05-31 DIAGNOSIS — R531 Weakness: Secondary | ICD-10-CM | POA: Diagnosis not present

## 2021-05-31 DIAGNOSIS — I480 Paroxysmal atrial fibrillation: Secondary | ICD-10-CM | POA: Diagnosis not present

## 2021-05-31 DIAGNOSIS — C3490 Malignant neoplasm of unspecified part of unspecified bronchus or lung: Secondary | ICD-10-CM | POA: Diagnosis not present

## 2021-05-31 DIAGNOSIS — J189 Pneumonia, unspecified organism: Secondary | ICD-10-CM | POA: Diagnosis not present

## 2021-05-31 DIAGNOSIS — C189 Malignant neoplasm of colon, unspecified: Secondary | ICD-10-CM | POA: Diagnosis not present

## 2021-05-31 DIAGNOSIS — K921 Melena: Secondary | ICD-10-CM | POA: Diagnosis not present

## 2021-05-31 DIAGNOSIS — E860 Dehydration: Secondary | ICD-10-CM | POA: Diagnosis not present

## 2021-05-31 MED ORDER — MORPHINE 100MG IN NS 100ML (1MG/ML) PREMIX INFUSION
2.0000 mg/h | INTRAVENOUS | Status: DC
  Administered 2021-05-31: 2 mg/h via INTRAVENOUS
  Filled 2021-05-31: qty 100

## 2021-05-31 MED ORDER — MORPHINE BOLUS VIA INFUSION
1.0000 mg | INTRAVENOUS | Status: DC | PRN
  Filled 2021-05-31: qty 1

## 2021-05-31 NOTE — Progress Notes (Signed)
?PROGRESS NOTE ? ?[name] IRS:[id number] DOB: Jan 22, 1946  ? ?PCP: Ann Held, DO ? ?Patient is from: Home. ? ?DOA: 05/22/2021 LOS: 12 ? ?Chief complaints ?Chief Complaint  ?Patient presents with  ? Dehydration  ? Weakness  ? Melena  ?  ? ?Brief Narrative / Interim history: ?[name] is a 75 y.o. F with permAF on Eliquis, COPD/bronchiectasis/MAI and chronic respiratory failure on 4L at home, CAD, hx PPM, chronic pain and dCHF who presented with weakness and back pain.  Also found incidentally to report melena and have FOBT in the ER.   ? ? ?3/2: Admitted, started on PPI given melena, transfused 2 units; given antibiotics given multifocal PNA on CXR ?3/3: GI consulted for melena; CT chest showed RUL mass, loculated effusion and lytic lesions scattered in bone --> Pulmonology consulted ?3/4: EGD significant for gastritis only  ?3/7: Colonoscopy showed a subtle mucosal irregularity at her anastomosis, biopsied, but no definite source of bleeding ?3/8: Biopsy from colonoscopy resulted adenoCA ?3/9: Gen Surg, Palliative Care and Oncology consulted ?3/10: Bronchoscopic biopsy of mediastinal lymph node by Pulmonology ?3/11: More tachycardia overnight to 120s, 130s, very weak today, continue limited scope care but code status changed to DNR ?3/12: Worsening again, mentation worse ?3/13: Weaker, mentation poor  ? ?Subjective: ?Seen and examined earlier this morning.  Family members including husband and daughter at bedside.   ? ?Objective: ?Vitals:  ? 05/30/21 2026 05/30/21 2055 05/31/21 0100 05/31/21 0900  ?BP:    131/73  ?Pulse: (!) 122   (!) 114  ?Resp:  (!) 30 (!) 22 (!) 24  ?Temp:      ?TempSrc:      ?SpO2:      ?Weight:      ?Height:      ? ? ?Examination: ? ?GENERAL: Frail and chronically ill-appearing. ?RESP:  No IWOB.  ?MSK/EXT: No apparent deformity. No edema.  ?NEURO: Somewhat somnolent. ?PSYCH: Calm.  No distress or agitation. ? ?Assessment and Plan: ?End of life care ?Suspected stage IV lung  cancer with metastasis to colon and bone ?Possible right upper lobe pneumonia ?Pleural effusion ?Paroxysmal atrial fibrillation (HCC) ?Symptomatic anemia ?Unstageable pressure ulcer of sacral region [facility]) ?Recurrent falls ?Chronic diastolic CHF (congestive heart failure) ([place]) ?Bronchiectasis ([place]) ?Protein-calorie malnutrition, moderate ([place]) ?UTI (urinary tract infection) ?Anemia, chronic disease ?Chronic hypoxemic respiratory failure (HCC) ?COPD with emphysema ([place]) ?Chronic pain syndrome ?Moderate malnutrition ?Pressure skin injury: POA ?-Patient is full comfort care.  Palliative medicine following.  Anticipating in hospital death ? ?DVT prophylaxis:  ?SCDs Start: 06/04/2021 2105 ? ?Code Status: DNR/DNI ?Family Communication: Updated patient's husband and daughter at bedside ?Level of care: Med-Surg ?Status is: Inpatient ?Remains inpatient appropriate because: Anticipating in hospital death ? ? ?Final disposition: Anticipating in hospital death ? ?Sch Meds:  ?Scheduled Meds: ? acetaminophen  1,000 mg Oral TID  ? Chlorhexidine Gluconate Cloth  6 each Topical Daily  ? collagenase   Topical Daily  ? feeding supplement  1 Container Oral BID BM  ? furosemide  20 mg Intravenous Once  ? glycopyrrolate  0.1 mg Intravenous Once  ? multivitamin with minerals  1 tablet Oral Daily  ? nutrition supplement (JUVEN)  1 packet Oral BID BM  ? nystatin   Topical TID  ? sodium chloride flush  10-40 mL Intracatheter Q12H  ? ?Continuous Infusions: ? morphine 2 mg/hr (05/31/21 1257)  ? ?PRN Meds:.albuterol, hydrALAZINE, LORazepam **OR** LORazepam, morphine **AND** morphine, ondansetron **OR** ondansetron (ZOFRAN) IV, oxyCODONE **AND** [DISCONTINUED] acetaminophen, prochlorperazine,  sodium chloride flush, zolpidem ? ?Antimicrobials: ?Anti-infectives (From admission, onward)  ? ? Start     Dose/Rate Route Frequency Ordered Stop  ? 05/29/21 1700  vancomycin (VANCOREADY) IVPB 750 mg/150 mL  Status:  Discontinued       ? 750 mg ?150 mL/hr  over 60 Minutes Intravenous Every 48 hours 05/27/21 1139 05/29/21 1020  ? 05/28/21 2200  vancomycin (VANCOREADY) IVPB 750 mg/150 mL  Status:  Discontinued       ? 750 mg ?150 mL/hr over 60 Minutes Intravenous Every 36 hours 05/27/21 1134 05/27/21 1138  ? 05/28/21 0400  ceFEPIme (MAXIPIME) 2 g in sodium chloride 0.9 % 100 mL IVPB  Status:  Discontinued       ? 2 g ?200 mL/hr over 30 Minutes Intravenous Every 12 hours 05/27/21 1134 05/30/21 1415  ? 05/27/21 1130  ceFEPIme (MAXIPIME) 2 g in sodium chloride 0.9 % 100 mL IVPB       ? 2 g ?200 mL/hr over 30 Minutes Intravenous  Once 05/27/21 1043 05/27/21 1447  ? 05/27/21 1130  metroNIDAZOLE (FLAGYL) IVPB 500 mg  Status:  Discontinued       ? 500 mg ?100 mL/hr over 60 Minutes Intravenous Every 12 hours 05/27/21 1043 05/29/21 1020  ? 05/27/21 1130  vancomycin (VANCOCIN) IVPB 1000 mg/200 mL premix       ? 1,000 mg ?200 mL/hr over 60 Minutes Intravenous  Once 05/27/21 1043 05/27/21 1755  ? 05/24/21 1200  vancomycin (VANCOREADY) IVPB 500 mg/100 mL       ? 500 mg ?100 mL/hr over 60 Minutes Intravenous Every 24 hours 05/30/2021 1046 05/25/21 1334  ? 05/19/2021 1145  vancomycin (VANCOREADY) IVPB 750 mg/150 mL       ? 750 mg ?150 mL/hr over 60 Minutes Intravenous  Once 05/25/2021 1045 06/04/2021 1600  ? 05/19/21 2200  cefTRIAXone (ROCEPHIN) 1 g in sodium chloride 0.9 % 100 mL IVPB  Status:  Discontinued       ? 1 g ?200 mL/hr over 30 Minutes Intravenous Every 24 hours 05/24/2021 2213 05/19/21 1203  ? 05/19/21 2200  azithromycin (ZITHROMAX) 500 mg in sodium chloride 0.9 % 250 mL IVPB  Status:  Discontinued       ? 500 mg ?250 mL/hr over 60 Minutes Intravenous Every 24 hours 05/25/2021 2213 05/19/21 1203  ? 05/19/21 1300  levofloxacin (LEVAQUIN) tablet 500 mg  Status:  Discontinued       ? 500 mg Oral Daily 05/19/21 1202 05/27/21 1043  ? 05/21/2021 1745  cefTRIAXone (ROCEPHIN) 1 g in sodium chloride 0.9 % 100 mL IVPB       ? 1 g ?200 mL/hr over 30 Minutes Intravenous  Once 06/08/2021 1732 05/22/2021  2353  ? 05/22/2021 1745  azithromycin (ZITHROMAX) 500 mg in sodium chloride 0.9 % 250 mL IVPB       ? 500 mg ?250 mL/hr over 60 Minutes Intravenous  Once 06/02/2021 1732 05/19/21 0100  ? ?  ? ? ? ?I have personally reviewed the following labs and images: ?CBC: ?Recent Labs  ?Lab 05/27/21 ?1720 05/28/21 ?9675 05/28/21 ?0800 05/29/21 ?9163 05/30/21 ?8466  ?WBC 14.2* 17.5* 20.4* 22.0* 16.3*  ?NEUTROABS  --  14.0* 16.3*  --   --   ?HGB 9.4* 9.6* 9.9* 9.2* 8.1*  ?HCT 29.4* 29.1* 31.0* 28.8* 25.9*  ?MCV 91.3 91.8 92.3 94.1 94.9  ?PLT 150 154 173 145* 102*  ? ?BMP &GFR ?Recent Labs  ?Lab 05/22/2021 ?5993 05/27/21 ?5701 05/27/21 ?1720 05/28/21 ?0600  05/29/21 ?0940  ?NA 135 137 137 136 135  ?K 3.8 4.5 3.2* 3.6 3.5  ?CL 99 101 102 100 100  ?CO2 28 24 27 27 25   ?GLUCOSE 109* 158* 129* 116* 143*  ?BUN 26* 34* 42* 43* 42*  ?CREATININE 0.57 0.77 0.67 0.76 0.70  ?CALCIUM 9.3 9.5 8.9 9.6 9.5  ?MG  --   --  2.0  --  2.3  ?PHOS  --   --  4.6  --  5.6*  ? ?Estimated Creatinine Clearance: 39.6 mL/min (by C-G formula based on SCr of 0.7 mg/dL). ?Liver & Pancreas: ?Recent Labs  ?Lab 06/03/2021 ?2047 05/27/21 ?1720 05/28/21 ?0600 05/29/21 ?7680  ?AST  --  38 37 41  ?ALT  --  14 15 14   ?ALKPHOS  --  96 94 97  ?BILITOT  --  1.4* 1.8* 1.6*  ?PROT 6.5 6.2* 6.0* 6.2*  ?ALBUMIN  --  2.9* 2.9* 2.9*  ? ?No results for input(s): LIPASE, AMYLASE in the last 168 hours. ?No results for input(s): AMMONIA in the last 168 hours. ?Diabetic: ?No results for input(s): HGBA1C in the last 72 hours. ?No results for input(s): GLUCAP in the last 168 hours. ?Cardiac Enzymes: ?No results for input(s): CKTOTAL, CKMB, CKMBINDEX, TROPONINI in the last 168 hours. ?No results for input(s): PROBNP in the last 8760 hours. ?Coagulation Profile: ?Recent Labs  ?Lab 05/27/21 ?1720  ?INR 1.5*  ? ?Thyroid Function Tests: ?No results for input(s): TSH, T4TOTAL, FREET4, T3FREE, THYROIDAB in the last 72 hours. ?Lipid Profile: ?No results for input(s): CHOL, HDL, LDLCALC, TRIG, CHOLHDL,  LDLDIRECT in the last 72 hours. ?Anemia Panel: ?No results for input(s): VITAMINB12, FOLATE, FERRITIN, TIBC, IRON, RETICCTPCT in the last 72 hours. ?Urine analysis: ?   ?Component Value Date/Time  ? COLO

## 2021-05-31 NOTE — Progress Notes (Signed)
OT Cancellation Note ? ?Patient Details ?Name: [name] ?MRN: [id number] ?DOB: [birth year] ? ? ?Cancelled Treatment:    Reason Eval/Treat Not Completed: Other (comment). Patient has been made comfort care. OT will sign off.  ? ?[name] ?05/31/2021, 7:58 AM ?

## 2021-05-31 NOTE — Assessment & Plan Note (Signed)
Full comfort care. Appreciate help by palliative care ? ?

## 2021-05-31 NOTE — Progress Notes (Signed)
The proposed treatment discussed in conference is for discussion purpose only and is not a binding recommendation.  The patients have not been physically examined, or presented with their treatment options.  Therefore, final treatment plans cannot be decided.  

## 2021-05-31 NOTE — Progress Notes (Signed)
? ?                                                                                                                                                     ?                                                   ?Daily Progress Note  ? ?Patient Name: [name]       Date: 05/31/2021 ?DOB: 01-May-1945  Age: 76 y.o. MRN#: [id number] ?Attending Physician: Mercy Riding, MD ?Primary Care Physician: Carollee Herter, Alferd Apa, DO ?Admit Date: 06/12/2021 ? ?Reason for Consultation/Follow-up: Establishing goals of care ? ?Subjective: ?Appears to be actively dying, family holding vigil at bedside.  ?I met with her husband and daughter at bedside, we discussed biopsy results to the best of my ability, re discussed about comfort measures and need for continuous opioids for symptom management.  ?  ?  ?Length of Stay: 12 ? ?Current Medications: ?Scheduled Meds:  ? acetaminophen  1,000 mg Oral TID  ? Chlorhexidine Gluconate Cloth  6 each Topical Daily  ? collagenase   Topical Daily  ? feeding supplement  1 Container Oral BID BM  ? furosemide  20 mg Intravenous Once  ? glycopyrrolate  0.1 mg Intravenous Once  ? multivitamin with minerals  1 tablet Oral Daily  ? nutrition supplement (JUVEN)  1 packet Oral BID BM  ? nystatin   Topical TID  ? sodium chloride flush  10-40 mL Intracatheter Q12H  ? ? ?Continuous Infusions: ? morphine 2 mg/hr (05/31/21 1257)  ? ? ?PRN Meds: ?albuterol, hydrALAZINE, LORazepam **OR** LORazepam, morphine **AND** morphine, ondansetron **OR** ondansetron (ZOFRAN) IV, oxyCODONE **AND** [DISCONTINUED] acetaminophen, prochlorperazine, sodium chloride flush, zolpidem ? ?Physical Exam        ?General: essentially unresponsive at this point.  ?Heart: Tachycardic  ?lungs: Decreased ?Ext: No significant edema ?Skin: Warm and dry ? ?Vital Signs: BP 131/73 (BP Location: Right Arm)   Pulse (!) 114   Temp 97.7 ?F (36.5 ?C) (Oral)   Resp (!) 24   Ht 5' (1.524 m)   Wt 41.9 kg   SpO2 90%   BMI 18.04 kg/m?  ?SpO2: SpO2: 90 % ?O2  Device: O2 Device: Venturi Mask ?O2 Flow Rate: O2 Flow Rate (L/min): 14 L/min ? ?Intake/output summary:  ?Intake/Output Summary (Last 24 hours) at 05/31/2021 1548 ?Last data filed at 05/31/2021 0500 ?Gross per 24 hour  ?Intake --  ?Output 400 ml  ?Net -400 ml  ? ? ?LBM: Last BM Date : 05/27/21 ?Baseline Weight: Weight: 38.6 kg ?Most recent weight: Weight: 41.9 kg ? ?     ?Palliative Assessment/Data: ? ? ? ?  Flowsheet Rows   ? ?Flowsheet Row Most Recent Value  ?Intake Tab   ?Referral Department Hospitalist  ?Unit at Time of Referral Oncology Unit  ?Palliative Care Primary Diagnosis Cancer  ?Date Notified 05/24/21  ?Palliative Care Type New Palliative care  ?Reason for referral Clarify Goals of Care  ?Date of Admission 05/25/2021  ?Date first seen by Palliative Care 05/25/21  ?# of days Palliative referral response time 1 Day(s)  ?# of days IP prior to Palliative referral 6  ?Clinical Assessment   ?Palliative Performance Scale Score 40%  ?Psychosocial & Spiritual Assessment   ?Palliative Care Outcomes   ?Patient/Family meeting held? Yes  ?Who was at the meeting? Patient  ? ?  ? ? ?Patient Active Problem List  ? Diagnosis Date Noted  ? End of life care 05/31/2021  ? Palliative care by specialist   ? Myocardial injury 05/28/2021  ? DNR (do not resuscitate) 05/28/2021  ? Goals of care, counseling/discussion 05/28/2021  ? Palliative care patient 05/28/2021  ? Adenopathy 06/15/2021  ? Mediastinal adenopathy   ? Pleural effusion   ? Mass of upper lobe of right lung with suspected lytic lesions of bone 05/25/2021  ? Suspected stage IV lung cancer with metastasis to colon 05/24/2021  ? Unstageable pressure ulcer of sacral region ([place]) 05/19/2021  ? Acute and chronic respiratory failure (acute-on-chronic) ([place]) 05/19/2021  ? Community acquired pneumonia of right lung   ? Symptomatic anemia 06/07/2021  ? Possible right upper lobe pneumonia 06/04/2021  ? Recurrent falls 05/29/2021  ? Hematoma 08/07/2019  ? Other neutropenia ([place])   ?  Post-procedural fever 07/20/2019  ? Acute febrile illness 07/16/2019  ? Pulmonary MAI (mycobacterium avium-intracellulare) infection ([place]) 06/24/2019  ? History of total right hip replacement 05/27/2019  ? NICM (nonischemic cardiomyopathy) ([place]) 05/19/2019  ? Educated about COVID-19 virus infection 09/07/2018  ? Erythropoietin deficiency anemia 06/19/2018  ? Swelling of calf 02/04/2018  ? Bad odor of urine 12/09/2017  ? Iron deficiency anemia due to chronic blood loss 08/14/2017  ? Iron malabsorption 08/14/2017  ? Hyperlipidemia LDL goal <100 07/08/2017  ? Elevated ALT measurement   ? Hypotension 06/20/2017  ? HLD (hyperlipidemia) 06/20/2017  ? CAD (coronary artery disease) 06/20/2017  ? Protein-calorie malnutrition, severe ([place]) 06/20/2017  ? Generalized weakness   ? Weakness 05/30/2017  ? Focal neurological deficit 05/30/2017  ? Chronic diastolic CHF (congestive heart failure) ([place]) 05/30/2017  ? TIA (transient ischemic attack) 05/30/2017  ? AKI (acute kidney injury) ([place]) 05/30/2017  ? Abnormal LFTs 05/30/2017  ? Bronchiectasis ([place]) 04/22/2017  ? Right otitis media 07/17/2016  ? Cerumen impaction 07/17/2016  ? Osteoporosis 03/16/2016  ? Headache 11/20/2014  ? Skin infection 05/27/2014  ? Insomnia 05/27/2014  ? Abdominal wall pain 07/09/2013  ? Other dysphagia 02/02/2013  ? Platelets decreased ([place]) 01/29/2012  ? Vitamin D deficiency 12/13/2011  ? Hypokalemia 12/13/2011  ? Hypocalcemia 12/12/2011  ? Adhesions of intestine, very dense, with frozen abdomen 12/11/2011  ? SBO (small bowel obstruction), recurrent 12/11/2011  ? Protein-calorie malnutrition, moderate ([place]) 12/11/2011  ? Oxygen dependent   ? Hx of gastric ulcer   ? UTI (urinary tract infection) 10/15/2011  ? Allergic conjunctivitis 10/15/2011  ? Degenerative arthritis of hip 08/03/2011  ? B12 deficiency 07/02/2011  ? Bilateral leg weakness 01/16/2011  ? Anemia, chronic disease 09/14/2010  ? Recurrent pneumonia 08/11/2010  ? CRT- [employer] 06/26/2010  ?  DYSURIA 05/19/2010  ? Chronic hypoxemic respiratory failure ([place]) 02/21/2010  ? DEHYDRATION 01/06/2010  ? GASTROENTERITIS 01/06/2010  ?  Congestive dilated cardiomyopathy ([place]) 11/17/2009  ? LBBB 11/17/2009  ? SYSTOLIC HEART FAILURE, CHRONIC 11/17/2009  ? GERD (gastroesophageal reflux disease) 02/17/2009  ? NONSPECIFIC ABN FINDNG RAD&OTH EXAM BILARY TRCT 09/03/2008  ? COPD with emphysema ([place]) 06/28/2008  ? DIVERTICULOSIS, COLON 06/03/2008  ? GASTRITIS 06/02/2008  ? Paroxysmal atrial fibrillation ([place]) 12/08/2007  ? GASTROPARESIS 08/07/2007  ? NAUSEA, CHRONIC 08/07/2007  ? Abdominal pain 08/07/2007  ? SYNCOPE 02/17/2007  ? RESTLESS LEG SYNDROME 08/01/2006  ? Chronic pain syndrome 08/01/2006  ? OSTEOARTHRITIS 07/31/2006  ? Degenerative disc disease, lumbar 07/31/2006  ? Fibromyalgia 07/31/2006  ? CHRONIC FATIGUE SYNDROME 07/31/2006  ? URINARY INCONTINENCE 07/31/2006  ? ANEMIA-IRON DEFICIENCY 05/01/2006  ? Essential hypertension 05/01/2006  ? ? ?Palliative Care Assessment & Plan  ? ?Patient Profile: ?76 y.o. female  with past medical history of COPD/emphysema/MAI on 4 L O2 at home, new pulmonary mass admitted, anemia on erythropoietin followed by Dr. Marin Olp, A-fib on Eliquis, multiple prior abdominal surgeries with significant colon resections, hypertension, CAD, GERD, CHF, fibromyalgia and pacemaker on 06/05/2021 with increased weakness and dark stool.  She had EGD that was revealing for gastritis and then had colonoscopy that revealed adenocarcinoma with signet ring cell features at the anastomosis of prior surgery.  She has also undergone bronchoscopic evaluation and biopsy for lung mass. palliative consulted for goals of care. ? ?Recommendations/Plan: ?DNR/DNI ?Comfort measures ?Consider opioid infusion ?Continue supplemental O2 ?Anticipate hospital death ?Prognosis possibly limited to few days in my opinion.  ?  ? ?Code Status: ? ?  ?Code Status Orders  ?(From admission, onward)  ?  ? ? ?  ? ?  Start     Ordered  ?  05/27/21 1807  Do not attempt resuscitation (DNR)  Continuous       ?Question Answer Comment  ?In the event of cardiac or respiratory ARREST Do not call a ?code blue?   ?In the event of cardiac or respirat

## 2021-05-31 NOTE — Progress Notes (Signed)
At this point, Ms. Wainright is being transitioned to comfort only.  She appears to be fairly comfortable.  There is no dyspnea.  She is getting morphine.  I do not see that she needs a morphine drip right now. ? ?I just feel bad for her.  She has this metastatic cancer.  Again I have to suspect that this is going to be lung cancer given the findings on her radiologic studies.  The pathology is not back yet. ? ?Her family is with her.  They are very appreciative of the wonderful care that the staff upon [address] is providing. ? ?She likely will not make it to the weekend in my opinion.  She is quite petite.  She does not have much in the way of reserve to be able to survive without eating. ? ?Again, she is comfortable.  She has a strong faith.  Her family has a strong faith.  They are comforted by the fact that they know where she is going.  She has never been afraid.  She has always shown a remarkable strength despite all of the challenges that she has had with her health.  She has been a true inspiration for all of [place]. ? ?Lattie Haw, MD ? ?2 Timothy 4:16-18 ?

## 2021-06-01 ENCOUNTER — Ambulatory Visit: Payer: HMO | Admitting: Hematology & Oncology

## 2021-06-01 ENCOUNTER — Other Ambulatory Visit: Payer: HMO

## 2021-06-01 DIAGNOSIS — D72829 Elevated white blood cell count, unspecified: Secondary | ICD-10-CM

## 2021-06-01 DIAGNOSIS — J9620 Acute and chronic respiratory failure, unspecified whether with hypoxia or hypercapnia: Secondary | ICD-10-CM | POA: Diagnosis not present

## 2021-06-01 DIAGNOSIS — J470 Bronchiectasis with acute lower respiratory infection: Secondary | ICD-10-CM | POA: Diagnosis not present

## 2021-06-01 DIAGNOSIS — Z66 Do not resuscitate: Secondary | ICD-10-CM

## 2021-06-01 DIAGNOSIS — C187 Malignant neoplasm of sigmoid colon: Secondary | ICD-10-CM

## 2021-06-01 DIAGNOSIS — D638 Anemia in other chronic diseases classified elsewhere: Secondary | ICD-10-CM | POA: Diagnosis not present

## 2021-06-01 DIAGNOSIS — L89150 Pressure ulcer of sacral region, unstageable: Secondary | ICD-10-CM

## 2021-06-01 DIAGNOSIS — C3491 Malignant neoplasm of unspecified part of right bronchus or lung: Secondary | ICD-10-CM

## 2021-06-01 DIAGNOSIS — Z515 Encounter for palliative care: Secondary | ICD-10-CM | POA: Diagnosis not present

## 2021-06-01 LAB — CULTURE, BLOOD (ROUTINE X 2)
Culture: NO GROWTH
Culture: NO GROWTH
Special Requests: ADEQUATE
Special Requests: ADEQUATE

## 2021-06-02 ENCOUNTER — Telehealth: Payer: Self-pay

## 2021-06-02 NOTE — Telephone Encounter (Signed)
Per Dr Marin Olp, pt passed away as inpatient. Flowers ordered for husband. dph ?

## 2021-06-05 ENCOUNTER — Ambulatory Visit: Payer: HMO | Admitting: Physician Assistant

## 2021-06-05 DIAGNOSIS — C3490 Malignant neoplasm of unspecified part of unspecified bronchus or lung: Secondary | ICD-10-CM | POA: Diagnosis not present

## 2021-06-07 LAB — CYTOLOGY - NON PAP

## 2021-06-16 ENCOUNTER — Other Ambulatory Visit: Payer: HMO

## 2021-06-16 ENCOUNTER — Ambulatory Visit: Payer: HMO | Admitting: Hematology & Oncology

## 2021-06-17 NOTE — Death Summary Note (Signed)
? ?DEATH SUMMARY  ? ?Patient Details  ?Name: [name] ?MRN: [id number] ?DOB: May 09, 1945 ?TDV:VOHYW [name] ?Admission/Discharge Information  ? ?Admit Date:  June 05, 2021  ?Date of Death: Date of Death: 06-19-2021  ?Time of Death: Time of Death: 7371  ?Length of Stay: 13  ? ?Principle Cause of death: Stage 4 lung cancer ? ?Hospital Diagnoses: ?Principal Problem: ?  Stage 4 lung cancer ([name]) ?Active Problems: ?  End of life care ?  Possible right upper lobe pneumonia ?  Paroxysmal atrial fibrillation (HCC) ?  Pleural effusion ?  Symptomatic anemia ?  Chronic pain syndrome ?  Essential hypertension ?  COPD with emphysema ([name]) ?  Chronic hypoxemic respiratory failure (HCC) ?  Anemia, chronic disease ?  UTI (urinary tract infection) ?  Protein-calorie malnutrition, moderate ([name]) ?  Bronchiectasis ([name]) ?  Chronic diastolic CHF (congestive heart failure) ([name]) ?  Leukocytosis ?  Recurrent falls ?  Unstageable pressure ulcer of sacral region [facility]) ?  Acute and chronic respiratory failure (acute-on-chronic) ([name]) ?  Mediastinal adenopathy ?  Myocardial injury ?  DNR (Ross not resuscitate) ?  Goals of care, counseling/discussion ?  Palliative care patient ?  Palliative care by specialist ? ? ?Hospital Course: ?[name] is a 76 y.o. F with permAF on Eliquis, COPD/bronchiectasis/MAI and chronic respiratory failure on 4L at home, CAD, hx PPM, chronic pain and dCHF who presented with weakness and back pain.  Also found incidentally to report melena and have FOBT in Becky ER.   ?Patient was started on PPI given melena, transfused 2 units; given antibiotics for possible multifocal PNA on CXR.  GI consulted for melena.  EGD significant for gastritis only.  Colonoscopy showed septal mucosal irregularity at her anastomosis which was biopsied.  Biopsy resulted in adenocarcinoma.  CT chest showed RUL mass, loculated effusion and lytic lesions scattered in bone.  Pulmonology consulted.  Patient underwent bronchoscopic  biopsy of mediastinal lymph node by pulmonology.  Pathology showed non-small cell lung cancer.  General surgery, palliative care and oncology consulted.  CODE STATUS changed to DNR/DNI.  She continued to decline with tachycardia, worsening mentation and physical deconditioning.  Eventually, she was transitioned to full comfort care.  She passed away peacefully on 06-20-2022 with family members at bedside. ? ?Assessment and Plan: ?Stage 4 lung cancer ([place]) ?End of life care ?Possible right upper lobe pneumonia ?Pleural effusion ?Paroxysmal atrial fibrillation (HCC) ?Symptomatic anemia ?Unstageable pressure ulcer of sacral region [facility]) ?Recurrent falls ?Chronic diastolic CHF (congestive heart failure) ([name]) ?Bronchiectasis ([name]) ?Protein-calorie malnutrition, moderate ([name]) ?UTI (urinary tract infection) ?Anemia, chronic disease ?Chronic hypoxemic respiratory failure (HCC) ?COPD with emphysema ([name]) ?Chronic pain syndrome ?Moderate malnutrition ?Pressure skin injury: POA ? ? ? ?  ? ? ?Procedures: Endoscopy, colonoscopy and bronchoscopy ? ?Consultations: Gastroenterology, general surgery, pulmonology and palliative medicine ? ?Becky results of significant diagnostics from this hospitalization (including imaging, microbiology, ancillary and laboratory) are listed below for reference.  ? ?Significant Diagnostic Studies: ?Epidural Steroid injection ? ?Result Date: 2021-06-05 ?Magnus Sinning, MD     05/22/2021  5:45 AM Lumbar Epidural Steroid Injection - Interlaminar Approach with Fluoroscopic Guidance Patient: [name]     Date of Birth: [birth year] MRN: [id number] PCP: Ann Held, Ross     Visit Date: 2021/06/05  Universal Protocol:   Consent Given By: Becky patient Position: PRONE Additional Comments: Vital signs were monitored before and after Becky procedure. Patient was prepped and draped in Becky usual sterile fashion. Becky correct  patient, procedure, and site was verified. Injection Procedure Details: Procedure  diagnoses: Lumbar radiculopathy [M54.16] Meds Administered: Meds ordered this encounter Medications  methylPREDNISolone acetate (DEPO-MEDROL) injection 80 mg  Laterality: Right Location/Site:  L5-S1 Needle: 3.5 in., 20 ga. Tuohy Needle Placement: Paramedian epidural Findings:  -Comments: Excellent flow of contrast into Becky epidural space. Procedure Details: Using a paramedian approach from Becky side mentioned above, Becky region overlying Becky inferior lamina was localized under fluoroscopic visualization and Becky soft tissues overlying this structure were infiltrated with 4 ml. of 1% Lidocaine without Epinephrine. Becky Tuohy needle was inserted into Becky epidural space using a paramedian approach. Becky epidural space was localized using loss of resistance along with counter oblique bi-planar fluoroscopic views.  After negative aspirate for air, blood, and CSF, a 2 ml. volume of Isovue-250 was injected into Becky epidural space and Becky flow of contrast was observed. Radiographs were obtained for documentation purposes.  Becky injectate was administered into Becky level noted above. Additional Comments: Becky patient tolerated Becky procedure well Dressing: 2 x 2 sterile gauze and Band-Aid  Post-procedure details: Patient was observed during Becky procedure. Post-procedure instructions were reviewed. Patient left Becky clinic in stable condition.  ? ?DG Chest 1 View ? ?Result Date: 06/15/2021 ?CLINICAL DATA:  [place], dehydration EXAM: CHEST  1 VIEW COMPARISON:  04/26/2021 FINDINGS: Single frontal view of Becky chest demonstrates right chest wall port unchanged. Multi lead pacer is stable. Cardiac silhouette is unremarkable. Slight progression of Becky multifocal right-sided pneumonia seen previously. Trace right pleural effusion unchanged. Slight increased interstitial and ground-glass opacity within Becky left upper lobe. Right apical pleural thickening is unchanged since 04/10/2021, but has noticeably progressed since Becky 07/15/2020 CT and  02/03/2020 x-ray. No acute bony abnormalities. IMPRESSION: 1. Progressive multifocal bilateral airspace disease as above, consistent with worsening pneumonia. 2. Asymmetric right apical pleural thickening, which has progressed since Becky 11/05/2020 chest CT. 3. Given Becky persistent airspace disease and prominent right apical pleural thickening, follow-up chest CT with IV contrast may be useful to exclude underlying neoplasm. Electronically Signed   By: Randa Ngo M.D.   On: 05/22/2021 17:09  ? ?DG Lumbar Spine Complete ? ?Result Date: 05/31/2021 ?CLINICAL DATA:  Dehydration, previous fall EXAM: LUMBAR SPINE - COMPLETE 4+ VIEW COMPARISON:  04/24/2021 FINDINGS: Negative for fracture. Moderate narrowing of Becky interspace L4-5 with stable grade 1 anterolisthesis. Implanted sacral stimulator device. Right hip arthroplasty components partially visualized. Cholecystectomy clips. Aortic Atherosclerosis (ICD10-170.0). IMPRESSION: No acute findings.  Stable grade 1 anterolisthesis L4-5. Electronically Signed   By: Lucrezia Europe M.D.   On: 05/17/2021 16:59  ? ?DG Pelvis 1-2 Views ? ?Result Date: 05/22/2021 ?CLINICAL DATA:  Fell, weakness EXAM: PELVIS - 1-2 VIEW COMPARISON:  08/03/2011 FINDINGS: 2 frontal views of Becky pelvis and bilateral hips are obtained. Right hip arthroplasty is identified, with no evidence of acute complication. Mild left hip osteoarthritis. No acute displaced fracture, subluxation, or dislocation. Abandoned fragmented stimulator wire overlies Becky right sacral ala. Nerve stimulator and contiguous lead within Becky left lower abdomen and pelvis. Soft tissues are unremarkable. IMPRESSION: 1. No acute displaced fracture. Electronically Signed   By: Randa Ngo M.D.   On: 06/04/2021 17:05  ? ?CT CHEST W CONTRAST ? ?Result Date: 05/19/2021 ?CLINICAL DATA:  Pneumonia, respiratory illness, abnormal chest x-ray EXAM: CT CHEST WITH CONTRAST TECHNIQUE: Multidetector CT imaging of Becky chest was performed during  intravenous contrast administration. RADIATION DOSE REDUCTION: This exam was performed according to Becky departmental dose-optimization program which includes automated  exposure control, adjustment of the mA and/or kV according t

## 2021-06-17 NOTE — Progress Notes (Signed)
This nurse called into room per family. No spontaneous movement present. No response to verbal or tactile stimuli. No heart or lungs sounds were auscultated. Patient pronounced dead at 1652 per this nurse and Gerlene Fee RN. Family present at bedside, MD notified. ?

## 2021-06-17 NOTE — Progress Notes (Signed)
This nurse called into room per family. No spontaneous movement present. No response to verbal or tactile stimuli. No heart or lungs sounds were auscultated. Patient pronounced dead at 1652 per this nurse and Loretha Stapler, RN. Family present at bedside, MD notified. ?

## 2021-06-17 NOTE — Progress Notes (Signed)
Nutrition Brief Note ? ?Chart reviewed. ?Pt transitioned to comfort care.  ?No further nutrition interventions planned at this time.  ? ?Becky Bibles, MS, RD, LDN ?Inpatient Clinical Dietitian ?Contact information available via Amion ?  ?

## 2021-06-17 NOTE — Progress Notes (Signed)
? ?                                                                                                                                                     ?                                                   ?Daily Progress Note  ? ?Patient Name: [name]       Date: 23-Jun-2021 ?DOB: May 25, 1945  Age: 76 y.o. MRN#: [id number] ?Attending Physician: Mercy Riding, MD ?Primary Care Physician: Carollee Herter, Alferd Apa, DO ?Admit Date: 05/24/2021 ? ?Reason for Consultation/Follow-up: Establishing goals of care ? ?Subjective: ?Appears to be actively dying, husband at bedside.  ?Shallow breathing, apneic spells.  ?  ?  ?Length of Stay: 13 ? ?Current Medications: ?Scheduled Meds:  ? acetaminophen  1,000 mg Oral TID  ? Chlorhexidine Gluconate Cloth  6 each Topical Daily  ? collagenase   Topical Daily  ? feeding supplement  1 Container Oral BID BM  ? furosemide  20 mg Intravenous Once  ? glycopyrrolate  0.1 mg Intravenous Once  ? multivitamin with minerals  1 tablet Oral Daily  ? nutrition supplement (JUVEN)  1 packet Oral BID BM  ? nystatin   Topical TID  ? sodium chloride flush  10-40 mL Intracatheter Q12H  ? ? ?Continuous Infusions: ? morphine 2 mg/hr (05/31/21 2200)  ? ? ?PRN Meds: ?albuterol, hydrALAZINE, LORazepam **OR** LORazepam, morphine **AND** morphine, ondansetron **OR** ondansetron (ZOFRAN) IV, oxyCODONE **AND** [DISCONTINUED] acetaminophen, prochlorperazine, sodium chloride flush, zolpidem ? ?Physical Exam        ?General: unresponsive ?Heart: Tachycardic  ?lungs: Decreased, +apnea  ?Ext: No significant edema ?Skin: Warm and dry ? ?Vital Signs: BP 131/73 (BP Location: Right Arm)   Pulse (!) 114   Temp 97.7 ?F (36.5 ?C) (Oral)   Resp (!) 8   Ht 5' (1.524 m)   Wt 41.9 kg   SpO2 90%   BMI 18.04 kg/m?  ?SpO2: SpO2: 90 % ?O2 Device: O2 Device: Venturi Mask ?O2 Flow Rate: O2 Flow Rate (L/min): 14 L/min ? ?Intake/output summary:  ?Intake/Output Summary (Last 24 hours) at 06-23-2021 0946 ?Last data filed at 05/31/2021  2200 ?Gross per 24 hour  ?Intake 19.96 ml  ?Output --  ?Net 19.96 ml  ? ? ?LBM: Last BM Date : 05/30/21 ?Baseline Weight: Weight: 38.6 kg ?Most recent weight: Weight: 41.9 kg ? ?     ?Palliative Assessment/Data: ? ? ? ?Flowsheet Rows   ? ?Flowsheet Row Most Recent Value  ?Intake Tab   ?Referral Department Hospitalist  ?Unit at Time of Referral Oncology Unit  ?Palliative Care Primary Diagnosis Cancer  ?  Date Notified 05/24/21  ?Palliative Care Type New Palliative care  ?Reason for referral Clarify Goals of Care  ?Date of Admission 05/29/2021  ?Date first seen by Palliative Care 05/25/21  ?# of days Palliative referral response time 1 Day(s)  ?# of days IP prior to Palliative referral 6  ?Clinical Assessment   ?Palliative Performance Scale Score 40%  ?Psychosocial & Spiritual Assessment   ?Palliative Care Outcomes   ?Patient/Family meeting held? Yes  ?Who was at the meeting? Patient  ? ?  ? ? ?Patient Active Problem List  ? Diagnosis Date Noted  ? End of life care 05/31/2021  ? Palliative care by specialist   ? Myocardial injury 05/28/2021  ? DNR (do not resuscitate) 05/28/2021  ? Goals of care, counseling/discussion 05/28/2021  ? Palliative care patient 05/28/2021  ? Adenopathy 06/03/2021  ? Mediastinal adenopathy   ? Pleural effusion   ? Suspected stage IV lung cancer with metastasis to colon and bone 05/24/2021  ? Unstageable pressure ulcer of sacral region ([place]) 05/19/2021  ? Acute and chronic respiratory failure (acute-on-chronic) ([place]) 05/19/2021  ? Community acquired pneumonia of right lung   ? Symptomatic anemia 05/22/2021  ? Possible right upper lobe pneumonia 05/25/2021  ? Recurrent falls 06/16/2021  ? Leukocytosis 05/02/2021  ? Hematoma 08/07/2019  ? Other neutropenia ([place])   ? Post-procedural fever 07/20/2019  ? Acute febrile illness 07/16/2019  ? Pulmonary MAI (mycobacterium avium-intracellulare) infection ([place]) 06/24/2019  ? History of total right hip replacement 05/27/2019  ? NICM (nonischemic  cardiomyopathy) ([place]) 05/19/2019  ? Educated about COVID-19 virus infection 09/07/2018  ? Erythropoietin deficiency anemia 06/19/2018  ? Swelling of calf 02/04/2018  ? Bad odor of urine 12/09/2017  ? Iron deficiency anemia due to chronic blood loss 08/14/2017  ? Iron malabsorption 08/14/2017  ? Hyperlipidemia LDL goal <100 07/08/2017  ? Elevated ALT measurement   ? Hypotension 06/20/2017  ? HLD (hyperlipidemia) 06/20/2017  ? CAD (coronary artery disease) 06/20/2017  ? Protein-calorie malnutrition, severe ([place]) 06/20/2017  ? Generalized weakness   ? Weakness 05/30/2017  ? Focal neurological deficit 05/30/2017  ? Chronic diastolic CHF (congestive heart failure) ([place]) 05/30/2017  ? TIA (transient ischemic attack) 05/30/2017  ? AKI (acute kidney injury) ([place]) 05/30/2017  ? Abnormal LFTs 05/30/2017  ? Bronchiectasis ([place]) 04/22/2017  ? Right otitis media 07/17/2016  ? Cerumen impaction 07/17/2016  ? Osteoporosis 03/16/2016  ? Headache 11/20/2014  ? Skin infection 05/27/2014  ? Insomnia 05/27/2014  ? Abdominal wall pain 07/09/2013  ? Other dysphagia 02/02/2013  ? Platelets decreased ([place]) 01/29/2012  ? Vitamin D deficiency 12/13/2011  ? Hypokalemia 12/13/2011  ? Hypocalcemia 12/12/2011  ? Adhesions of intestine, very dense, with frozen abdomen 12/11/2011  ? SBO (small bowel obstruction), recurrent 12/11/2011  ? Protein-calorie malnutrition, moderate ([place]) 12/11/2011  ? Oxygen dependent   ? Hx of gastric ulcer   ? UTI (urinary tract infection) 10/15/2011  ? Allergic conjunctivitis 10/15/2011  ? Degenerative arthritis of hip 08/03/2011  ? B12 deficiency 07/02/2011  ? Bilateral leg weakness 01/16/2011  ? Anemia, chronic disease 09/14/2010  ? Recurrent pneumonia 08/11/2010  ? CRT- [employer] 06/26/2010  ? DYSURIA 05/19/2010  ? Chronic hypoxemic respiratory failure ([place]) 02/21/2010  ? DEHYDRATION 01/06/2010  ? GASTROENTERITIS 01/06/2010  ? Congestive dilated cardiomyopathy ([place]) 11/17/2009  ? LBBB 11/17/2009  ? SYSTOLIC HEART  FAILURE, CHRONIC 11/17/2009  ? GERD (gastroesophageal reflux disease) 02/17/2009  ? NONSPECIFIC ABN FINDNG RAD&OTH EXAM BILARY TRCT 09/03/2008  ? COPD with emphysema ([place]) 06/28/2008  ?  DIVERTICULOSIS, COLON 06/03/2008  ? GASTRITIS 06/02/2008  ? Paroxysmal atrial fibrillation ([place]) 12/08/2007  ? GASTROPARESIS 08/07/2007  ? NAUSEA, CHRONIC 08/07/2007  ? Abdominal pain 08/07/2007  ? SYNCOPE 02/17/2007  ? RESTLESS LEG SYNDROME 08/01/2006  ? Chronic pain syndrome 08/01/2006  ? OSTEOARTHRITIS 07/31/2006  ? Degenerative disc disease, lumbar 07/31/2006  ? Fibromyalgia 07/31/2006  ? CHRONIC FATIGUE SYNDROME 07/31/2006  ? URINARY INCONTINENCE 07/31/2006  ? ANEMIA-IRON DEFICIENCY 05/01/2006  ? Essential hypertension 05/01/2006  ? ? ?Palliative Care Assessment & Plan  ? ?Patient Profile: ?76 y.o. female  with past medical history of COPD/emphysema/MAI on 4 L O2 at home, new pulmonary mass admitted, anemia on erythropoietin followed by Dr. Marin Olp, A-fib on Eliquis, multiple prior abdominal surgeries with significant colon resections, hypertension, CAD, GERD, CHF, fibromyalgia and pacemaker on 06/08/2021 with increased weakness and dark stool.  She had EGD that was revealing for gastritis and then had colonoscopy that revealed adenocarcinoma with signet ring cell features at the anastomosis of prior surgery.  She has also undergone bronchoscopic evaluation and biopsy for lung mass. palliative consulted for goals of care. ? ?Recommendations/Plan: ?DNR/DNI ?Comfort measures ?Now on opioid infusion ?Continue supplemental O2 as patient was on O2 at home 24/7 prior to this event.  ?Anticipate hospital death ?Prognosis possibly limited to hours to 1-2 days in my opinion.  ?  ? ?Code Status: ? ?  ?Code Status Orders  ?(From admission, onward)  ?  ? ? ?  ? ?  Start     Ordered  ? 05/27/21 1807  Do not attempt resuscitation (DNR)  Continuous       ?Question Answer Comment  ?In the event of cardiac or respiratory ARREST Do not call a ?code  blue?   ?In the event of cardiac or respiratory ARREST Do not perform Intubation, CPR, defibrillation or ACLS   ?In the event of cardiac or respiratory ARREST Use medication by any route, position, wound care, and other

## 2021-06-17 DEATH — deceased

## 2021-06-23 ENCOUNTER — Other Ambulatory Visit: Payer: HMO

## 2021-06-23 ENCOUNTER — Ambulatory Visit: Payer: HMO

## 2021-06-27 ENCOUNTER — Ambulatory Visit: Payer: HMO | Admitting: Internal Medicine

## 2021-07-04 ENCOUNTER — Ambulatory Visit: Payer: HMO | Admitting: Pulmonary Disease

## 2021-07-11 ENCOUNTER — Encounter (HOSPITAL_COMMUNITY): Payer: Self-pay

## 2021-07-18 ENCOUNTER — Ambulatory Visit: Payer: HMO | Admitting: Family Medicine

## 2021-07-24 ENCOUNTER — Ambulatory Visit: Payer: HMO | Admitting: Family

## 2021-07-24 ENCOUNTER — Ambulatory Visit: Payer: HMO

## 2021-07-24 ENCOUNTER — Other Ambulatory Visit: Payer: HMO

## 2022-02-06 IMAGING — DX DG CHEST 2V
2 series · 2 of 2 positions shown · non-contrast
Comparison: 07/20/2019 radiography.  07/15/2020 CT.

CLINICAL DATA: Right-sided pleuritic chest pain. History of
bronchiectasis.

EXAM:
CHEST - 2 VIEW

[chest pa]
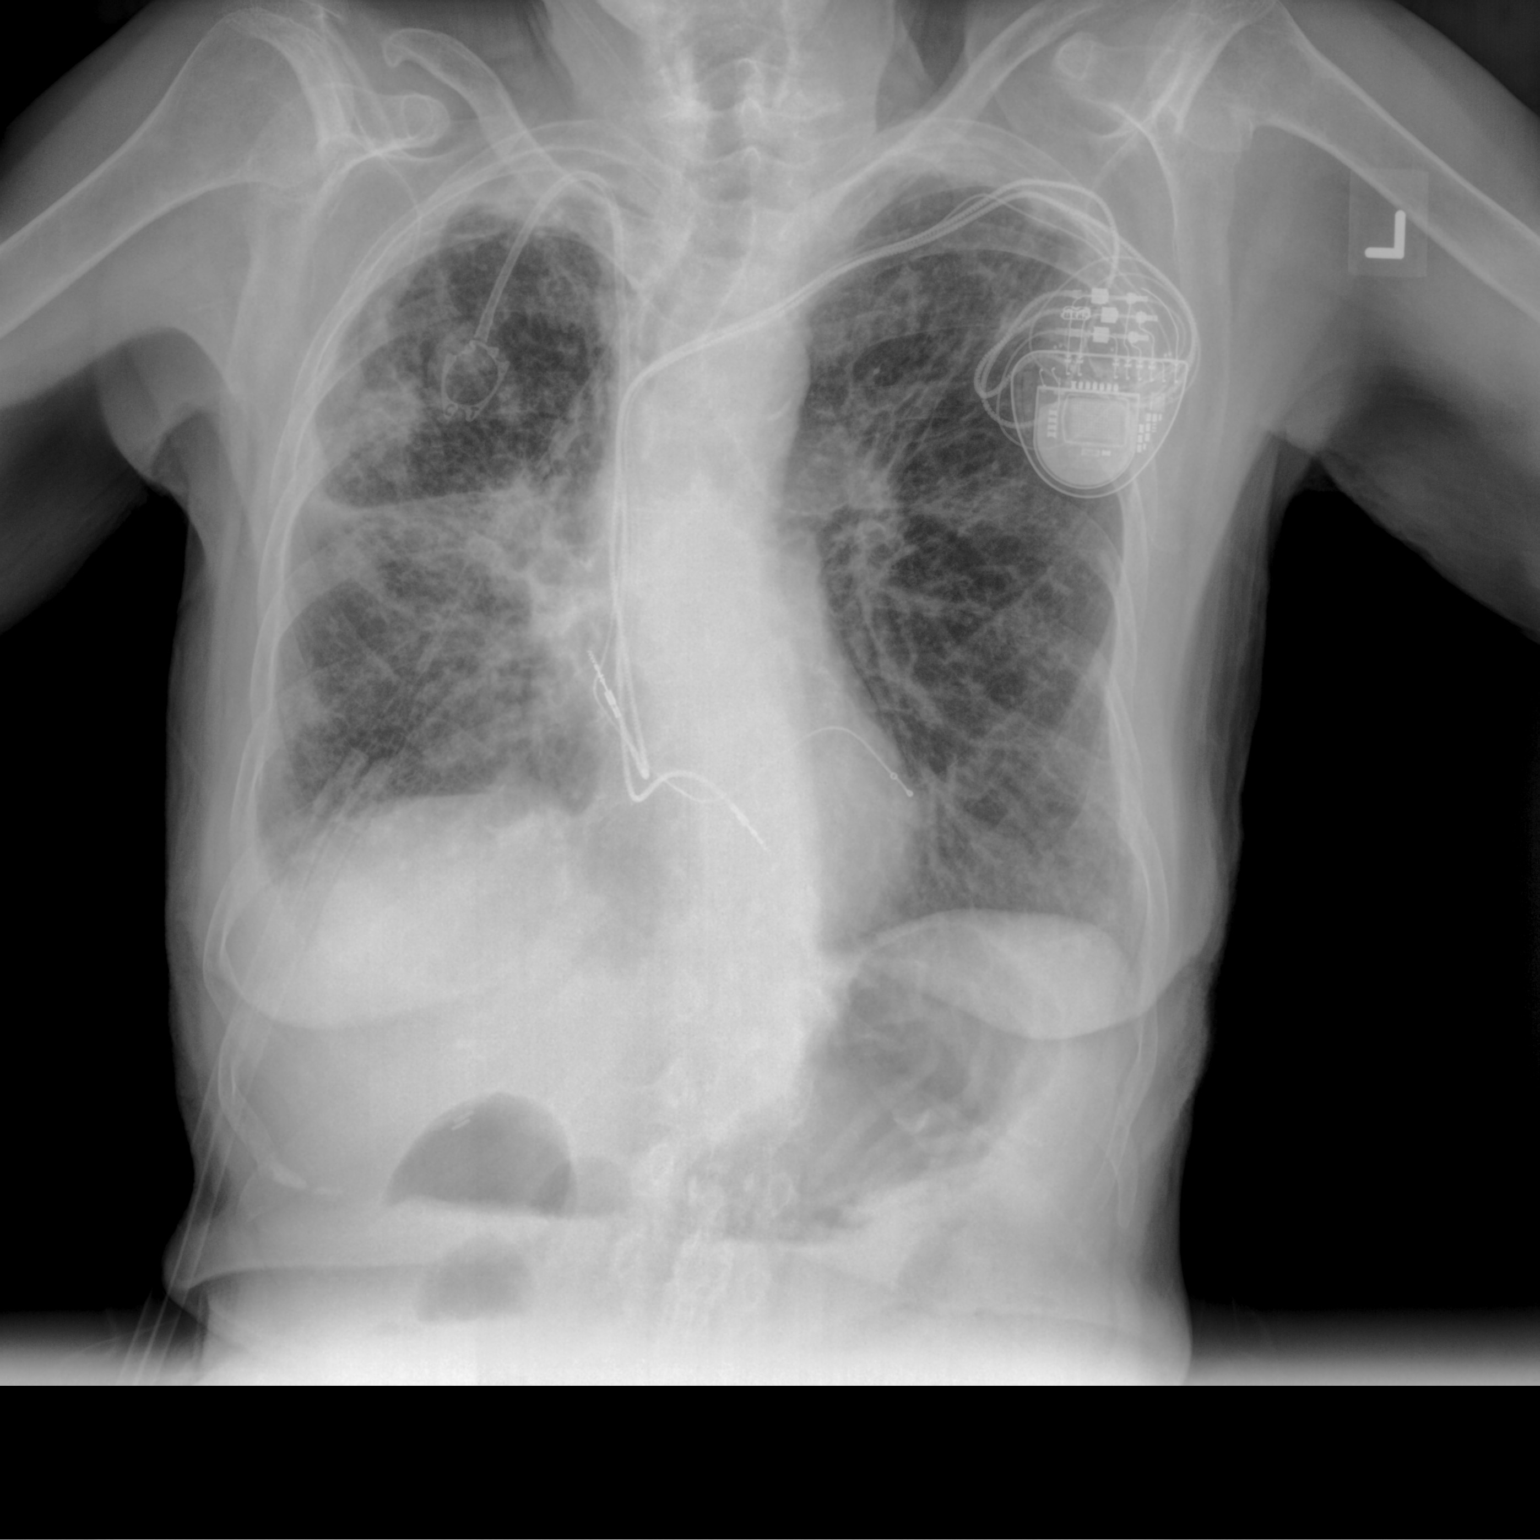

[chest lat]
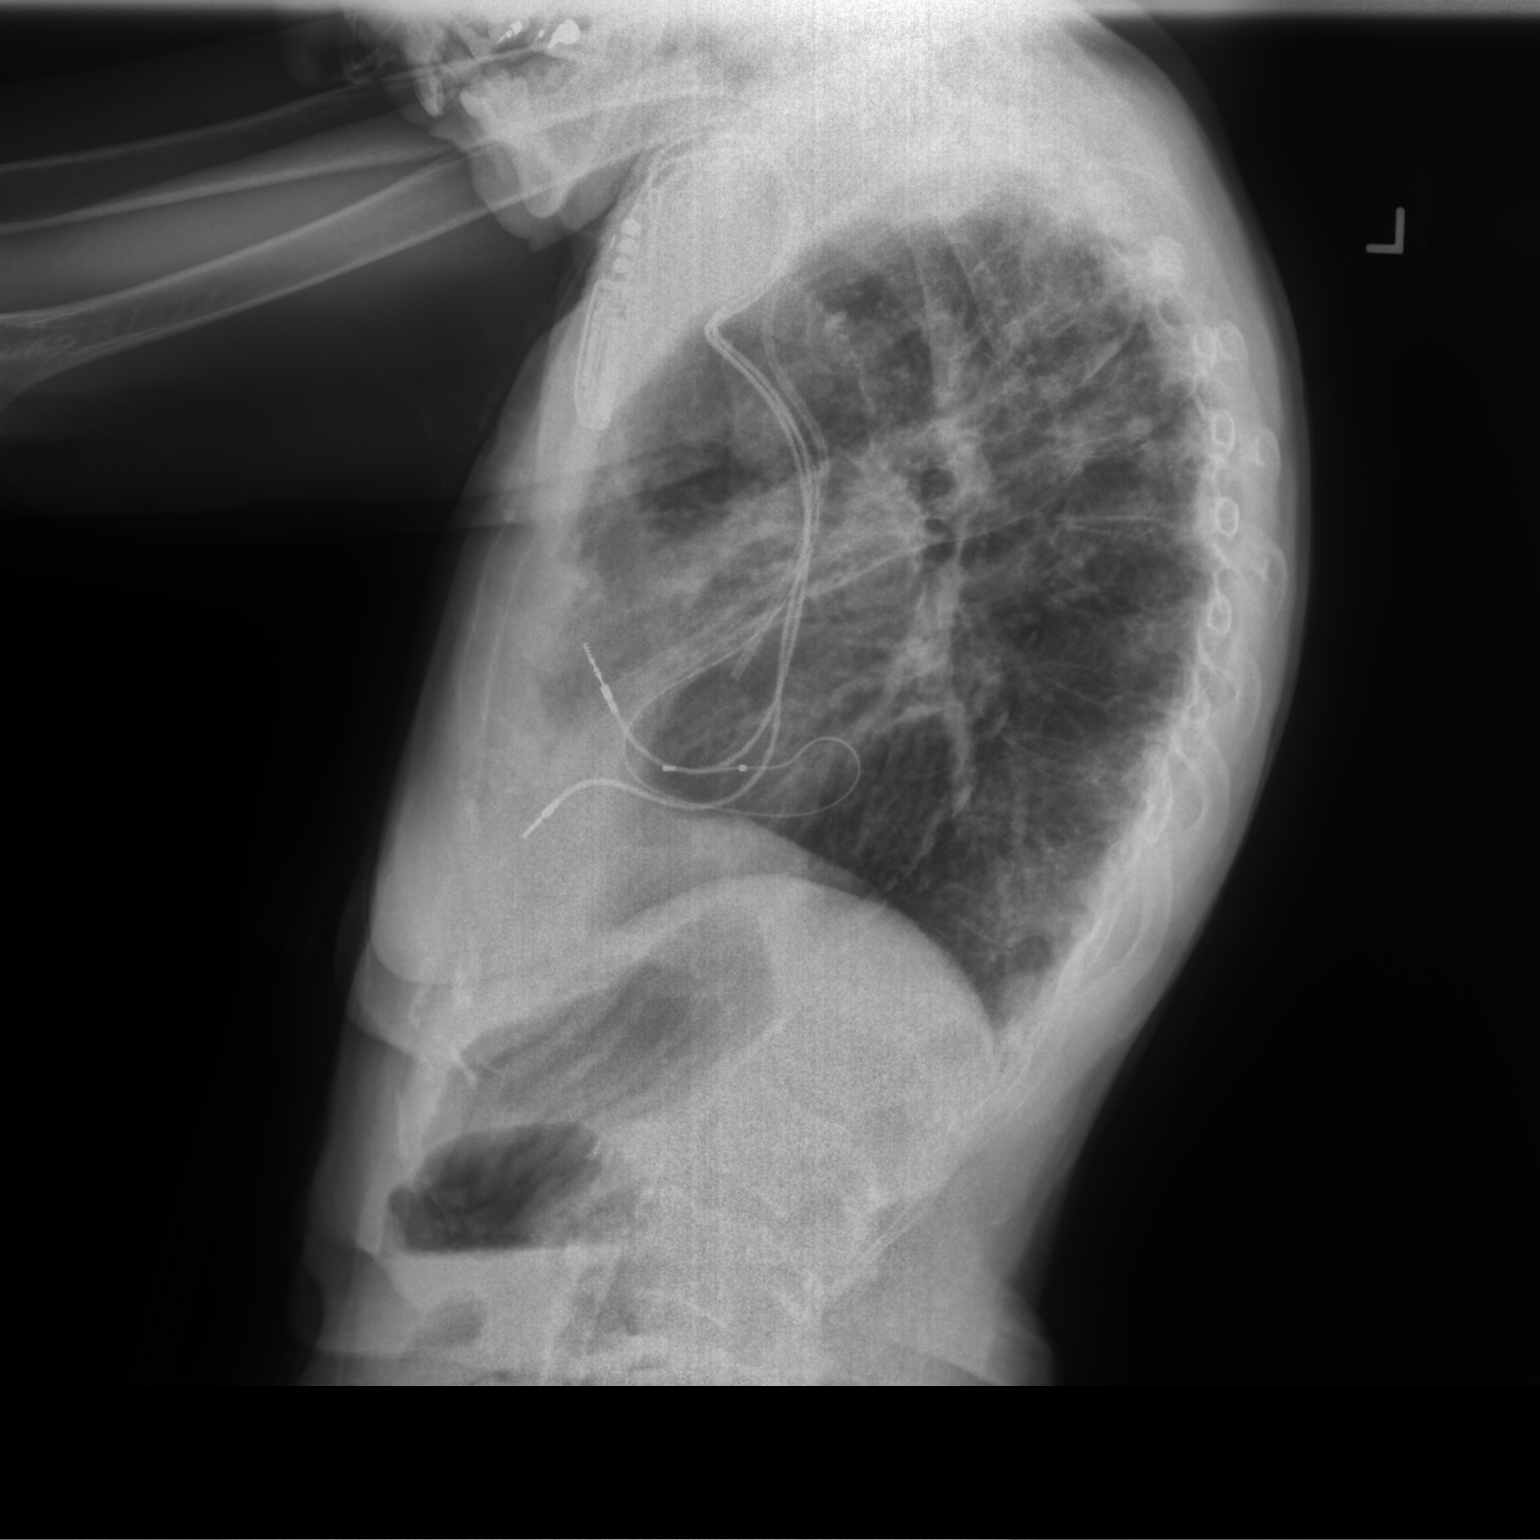

[2 of 2 positions shown; findings below may reference images not displayed]

FINDINGS: Pacemaker remains in place. Power port in place with the tip in the
SVC above the right atrium. Background pattern of underlying
bronchiectasis and pulmonary scarring. Acute pneumonia present on
the right, probably in the right middle lobe and with lesser patchy
involvement of the right upper lobe. No pleural effusion.
IMPRESSION: Background pulmonary scarring and bronchiectasis. Acute pneumonia in
the right mid lung, probably in the right middle lobe.

## 2022-02-22 IMAGING — DX DG CHEST 2V
2 series · 2 of 2 positions shown · non-contrast
Comparison: 04/10/2021

CLINICAL DATA: Follow-up of right-sided pneumonia

EXAM:
CHEST - 2 VIEW

[chest pa]
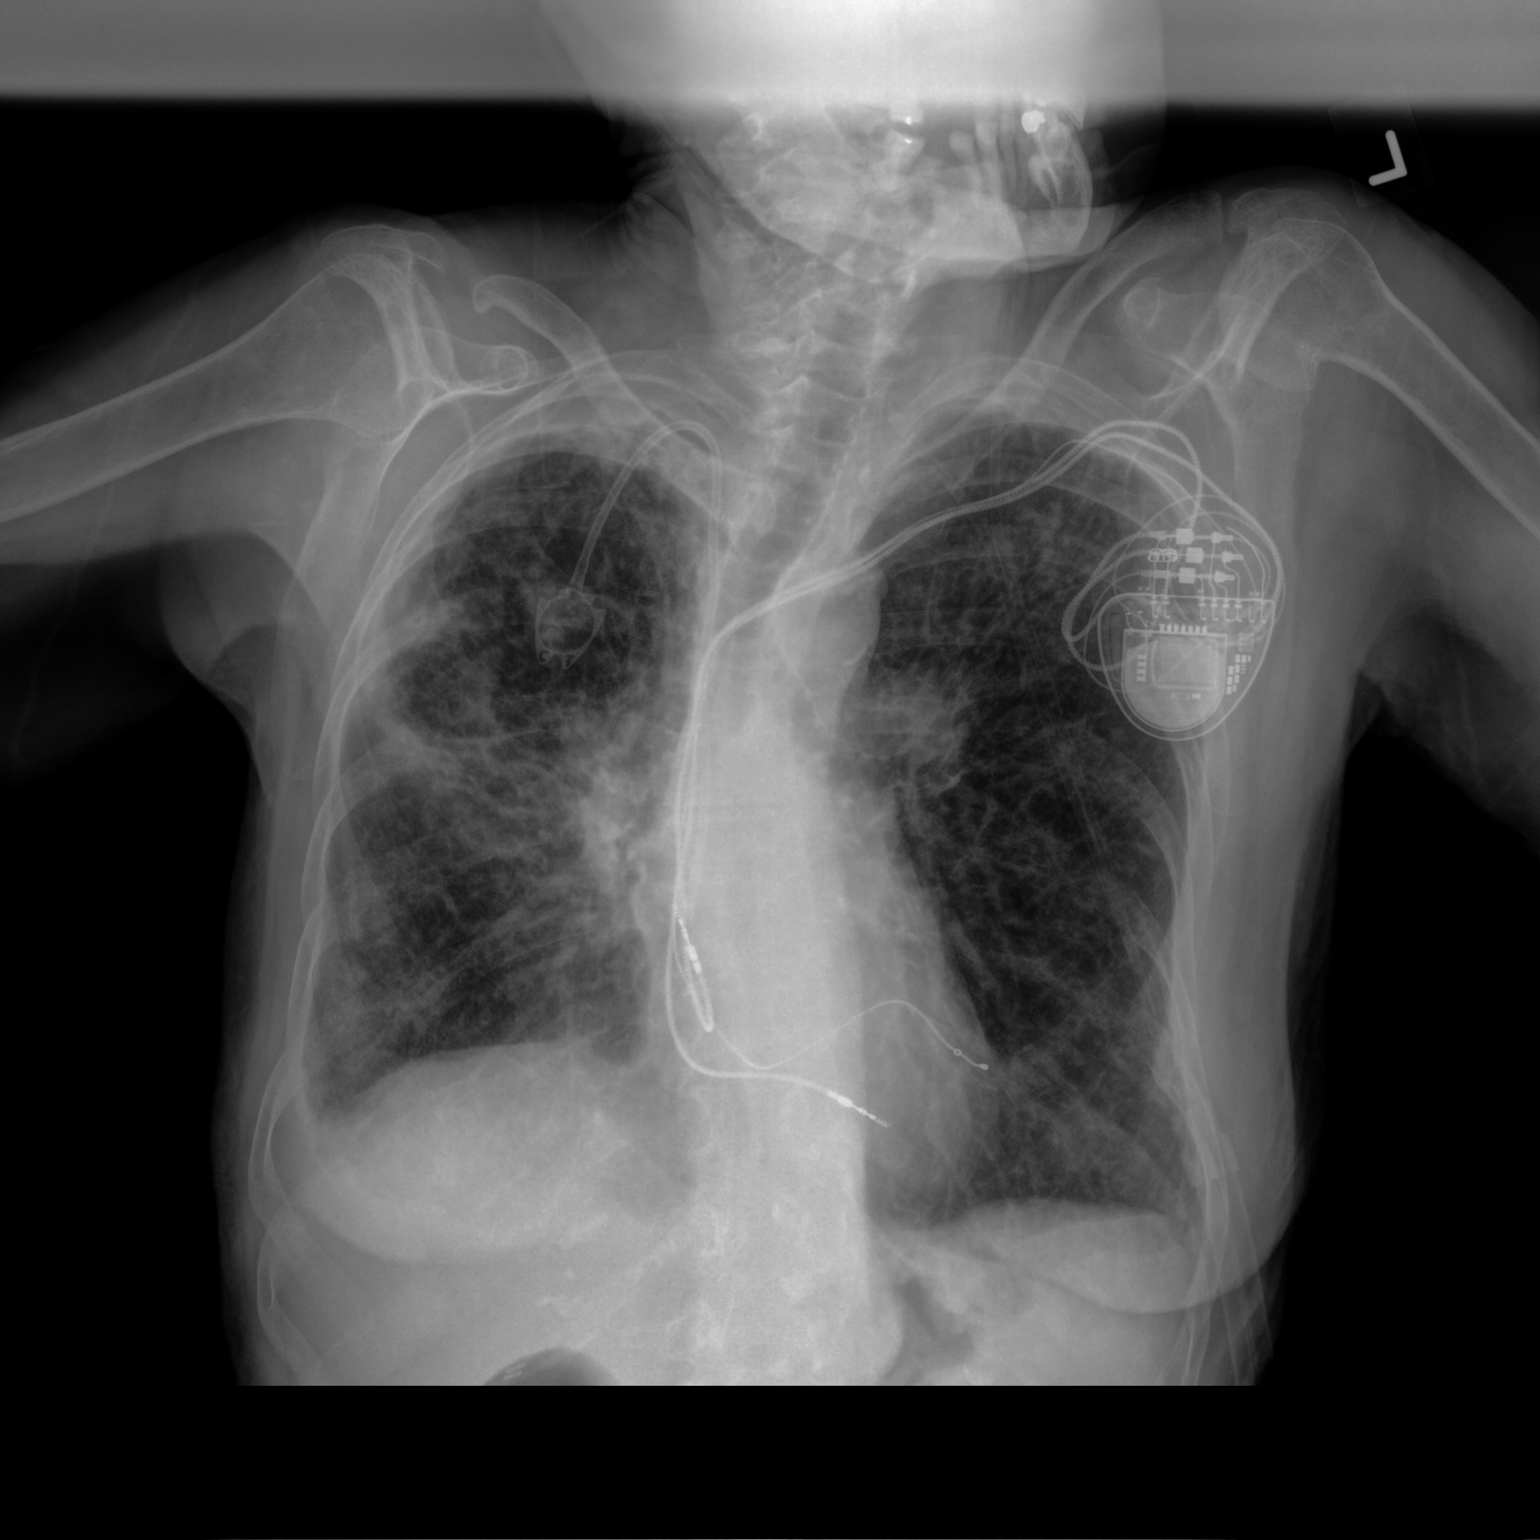

[chest lat]
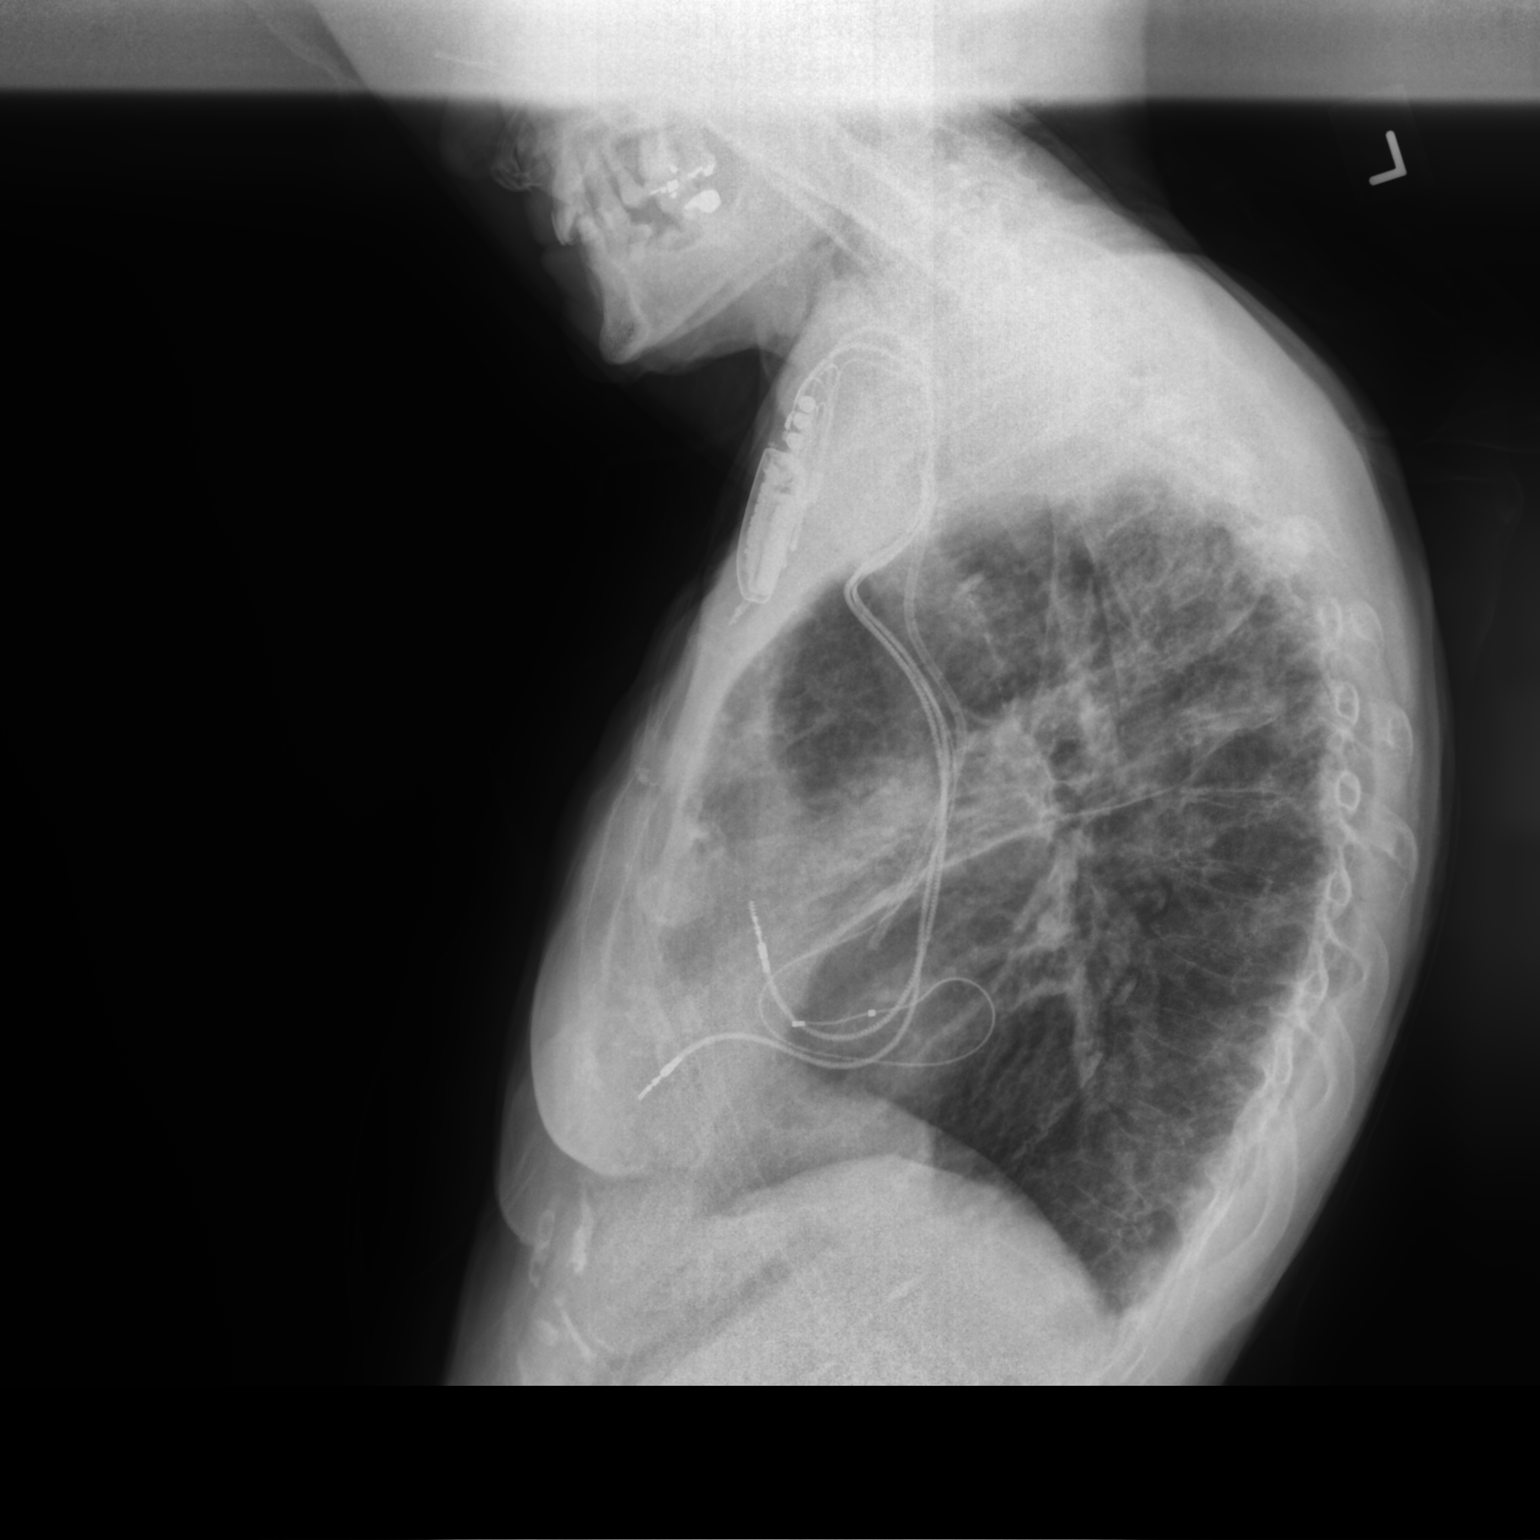

[2 of 2 positions shown; findings below may reference images not displayed]

FINDINGS: Right Port-A-Cath tip at low SVC. Pacer with leads at right atrium,
right ventricle, coronary sinus.

Midline trachea. Normal heart size. Atherosclerosis in the
transverse aorta. Trace right pleural fluid or thickening blunts the
costophrenic angle. No pneumothorax. Underlying pattern of diffuse
marked interstitial thickening with scattered more confluent
opacities bilaterally. The right mid lung consolidation is similar
to minimally improved. Felt to be within the inferior right upper
lobe and possibly the right middle lobe when correlated with the
lateral view. No new pulmonary opacities identified.

Osteopenia.
IMPRESSION: Similar to minimal improvement in right-sided pneumonia, in the
setting of diffuse chronic atypical/mycobacterial infection.

Aortic Atherosclerosis ([id number]-5E6.6).

Trace right pleural fluid or thickening, similar.

## 2022-03-16 IMAGING — CR DG LUMBAR SPINE COMPLETE 4+V
5 series · 5 of 5 positions shown · non-contrast
Comparison: 04/24/2021

CLINICAL DATA: Dehydration, previous fall

EXAM:
LUMBAR SPINE - COMPLETE 4+ VIEW

[t lumbar spine ap]
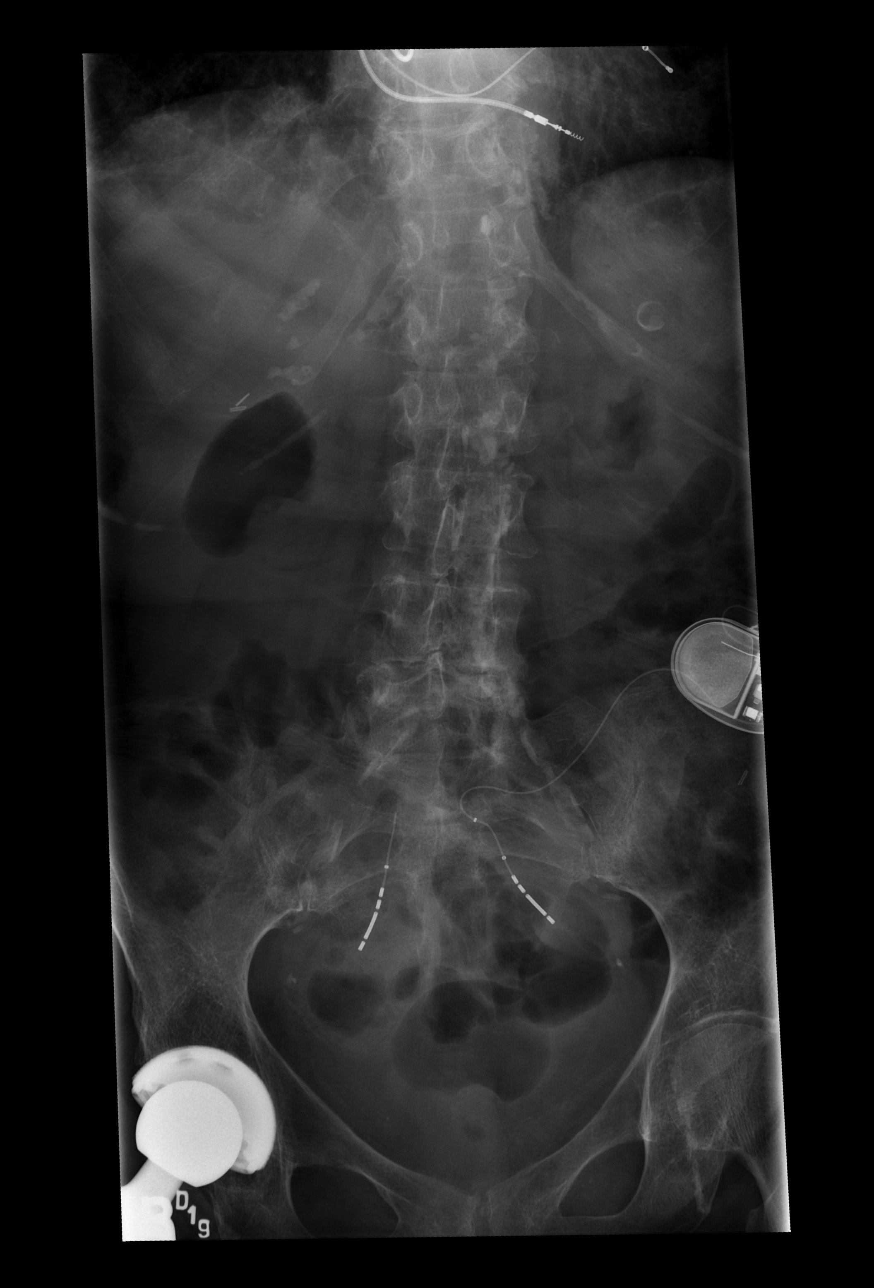

[t lumbar spine obl (1 of 2)]
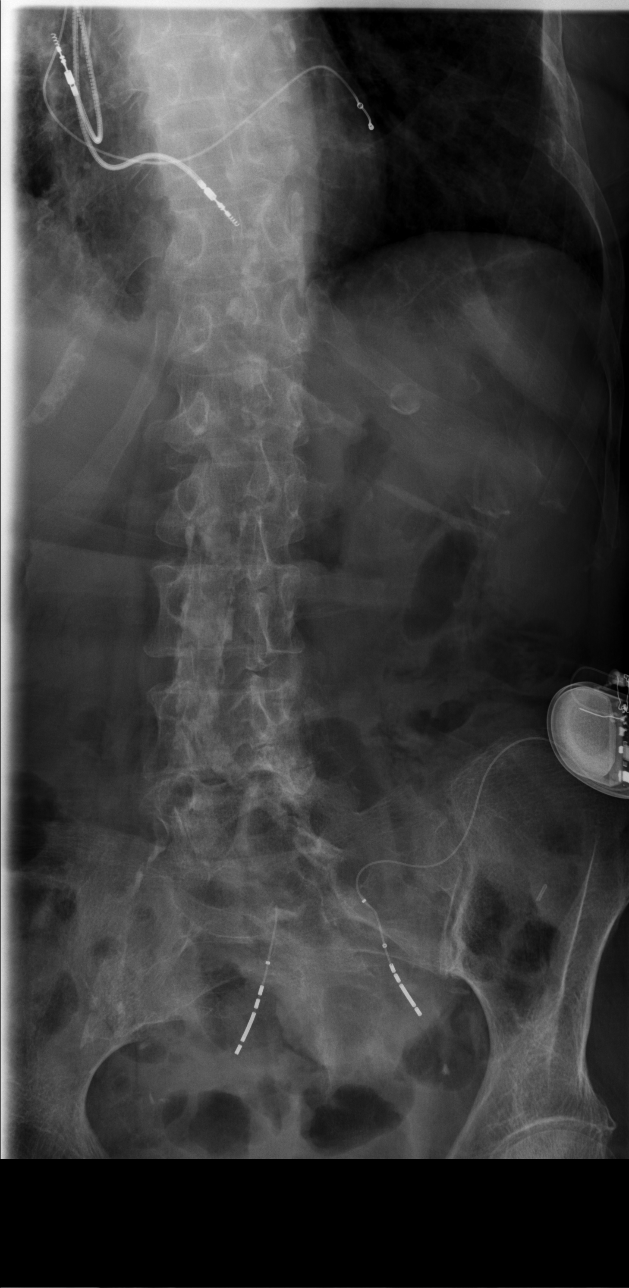

[t lumbar spine obl (2 of 2)]
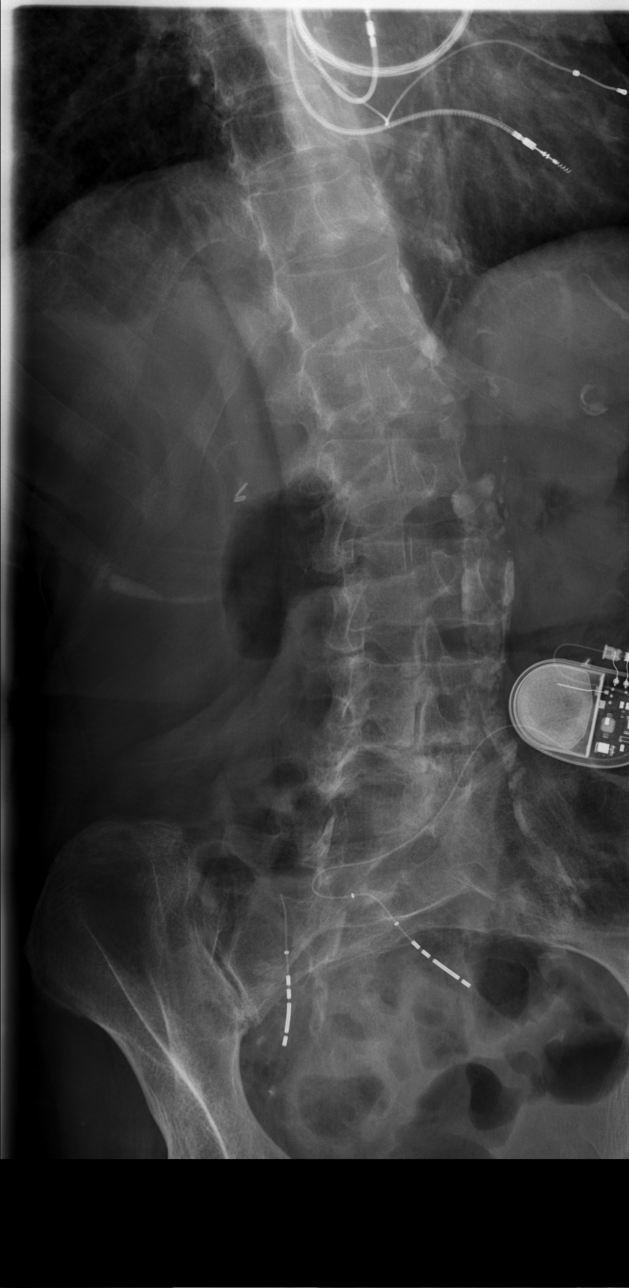

[t lumbar spine lat]
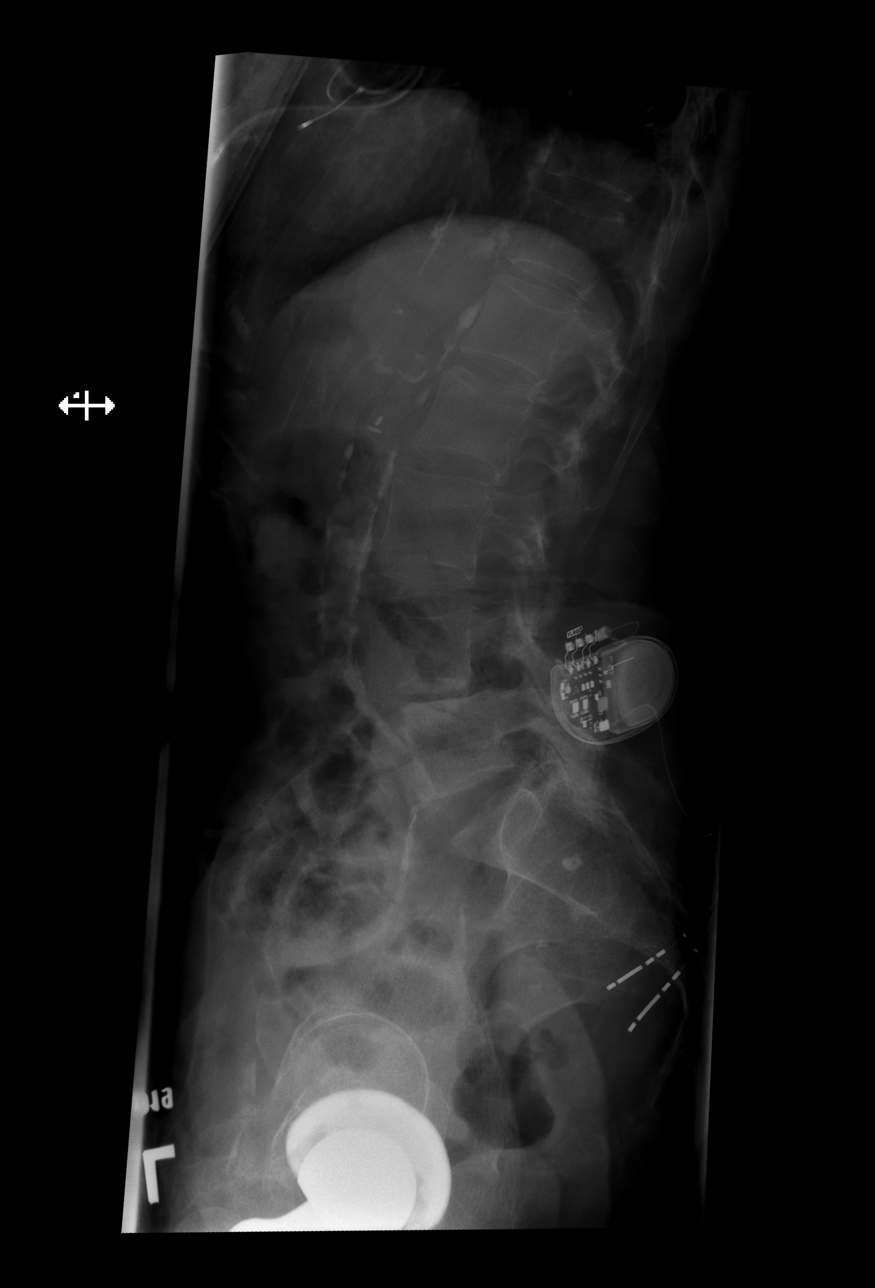

[t lumbar l-5 s-1 spot]
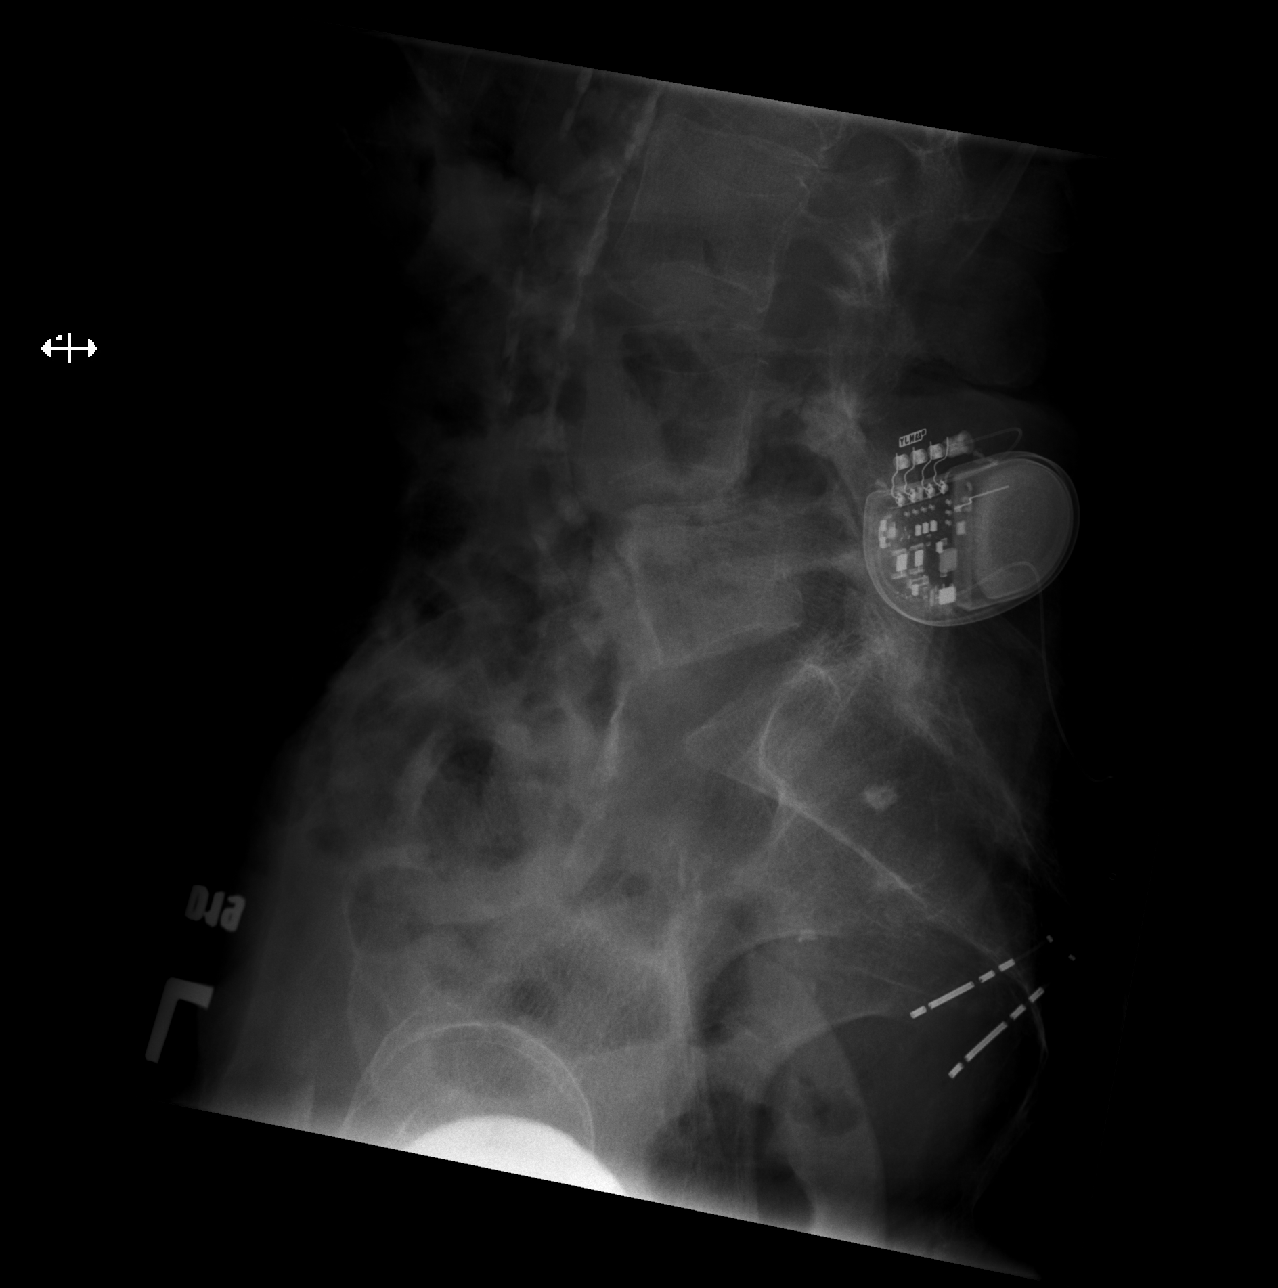

[5 of 5 positions shown; findings below may reference images not displayed]

FINDINGS: Negative for fracture. Moderate narrowing of the interspace L4-5
with stable grade 1 anterolisthesis. Implanted sacral stimulator
device. Right hip arthroplasty components partially visualized.
Cholecystectomy clips. Aortic Atherosclerosis (15OI1-170.0).
IMPRESSION: No acute findings.  Stable grade 1 anterolisthesis L4-5.

## 2022-03-16 IMAGING — CR DG CHEST 1V
1 series · 1 of 1 positions shown · non-contrast
Comparison: 04/26/2021

CLINICAL DATA: Fell, dehydration

EXAM:
CHEST  1 VIEW

[x chest ap]
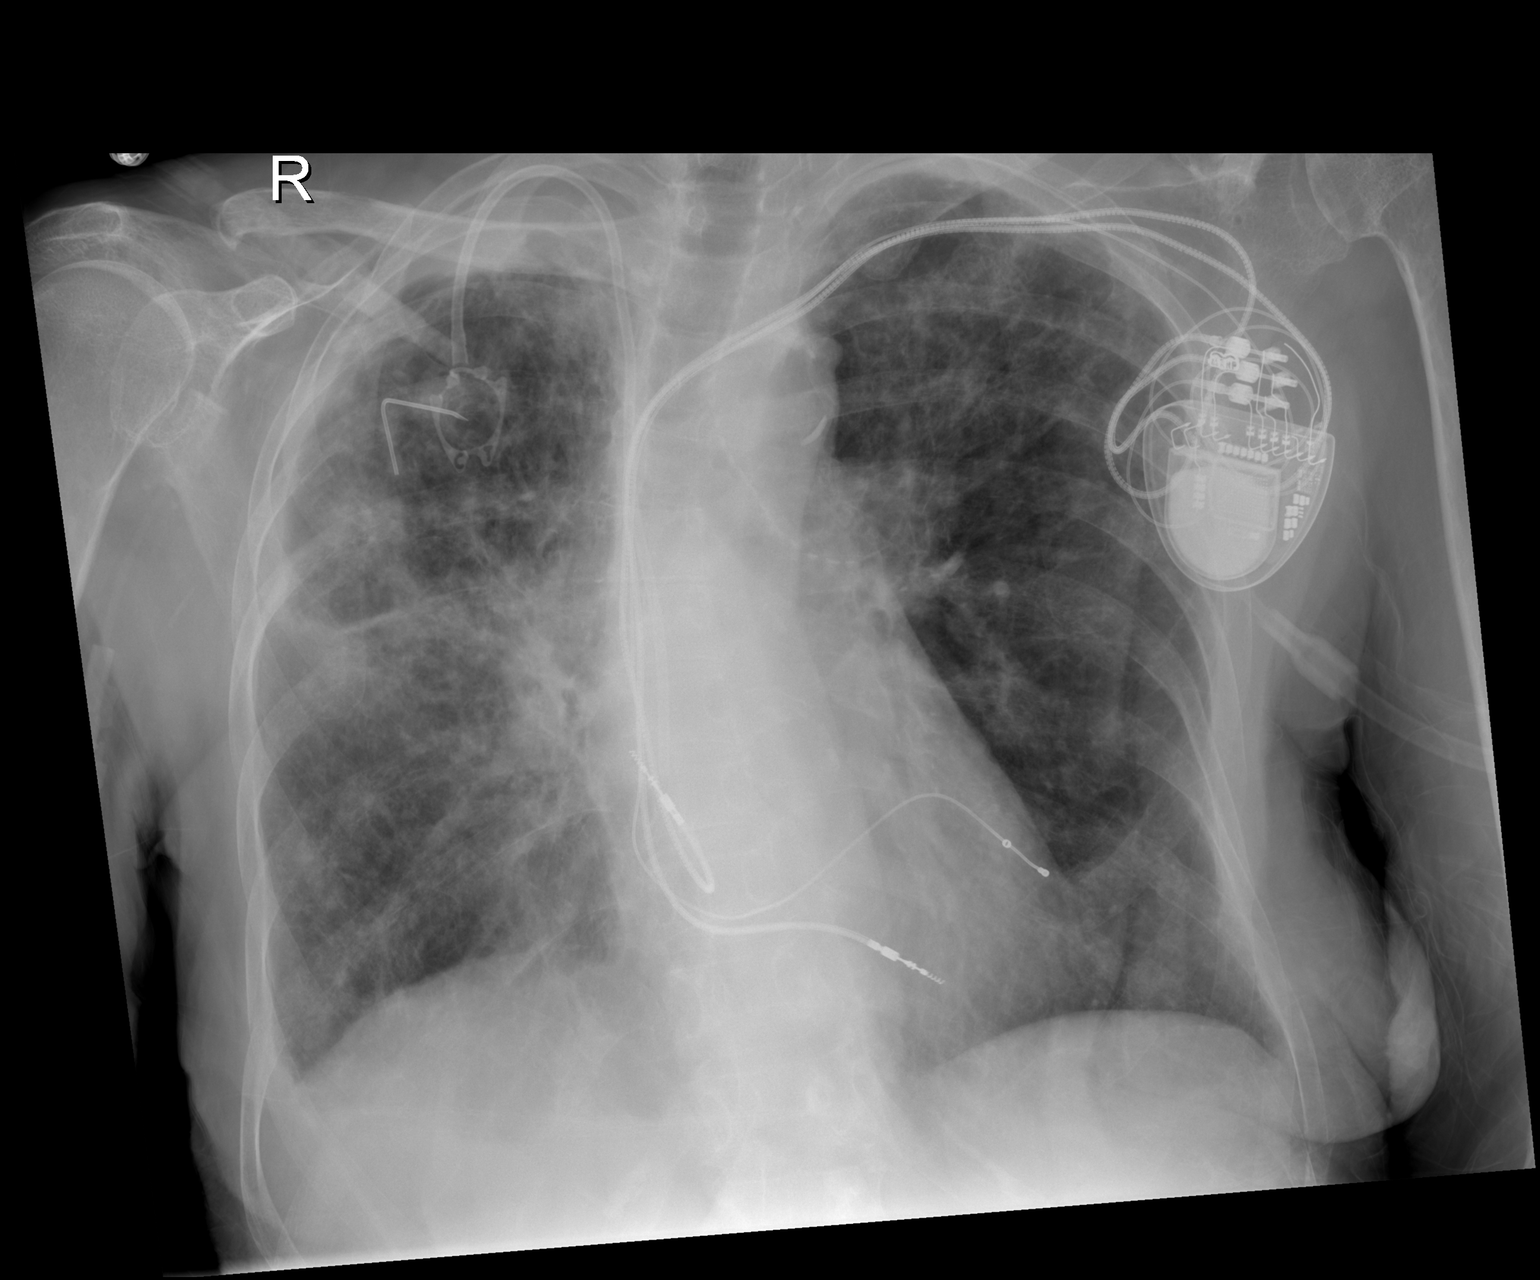

[1 of 1 positions shown; findings below may reference images not displayed]

FINDINGS: Single frontal view of the chest demonstrates right chest wall port
unchanged. Multi lead pacer is stable. Cardiac silhouette is
unremarkable. Slight progression of the multifocal right-sided
pneumonia seen previously. Trace right pleural effusion unchanged.
Slight increased interstitial and ground-glass opacity within the
left upper lobe. Right apical pleural thickening is unchanged since
04/10/2021, but has noticeably progressed since the 07/15/2020 CT
and 02/03/2020 x-ray. No acute bony abnormalities.
IMPRESSION: 1. Progressive multifocal bilateral airspace disease as above,
consistent with worsening pneumonia.
2. Asymmetric right apical pleural thickening, which has progressed
since the 11/05/2020 chest CT.
3. Given the persistent airspace disease and prominent right apical
pleural thickening, follow-up chest CT with IV contrast may be
useful to exclude underlying neoplasm.

## 2022-03-16 IMAGING — CT CT CHEST W/ CM
2 of 4 series · 14 of 36 positions shown, 17 images · IV contrast (OMNIPAQUE)
Comparison: 07/15/2020

CLINICAL DATA: Pneumonia, respiratory illness, abnormal chest x-ray

EXAM:
CT CHEST WITH CONTRAST
TECHNIQUE: Multidetector CT imaging of the chest was performed during
intravenous contrast administration.

[Series 2: axial st · axial · 0.58mm/px · z∈[+1440,+1682]mm · 11 of 143 slices shown, 14 images]
[im 11/143  mediastinal]
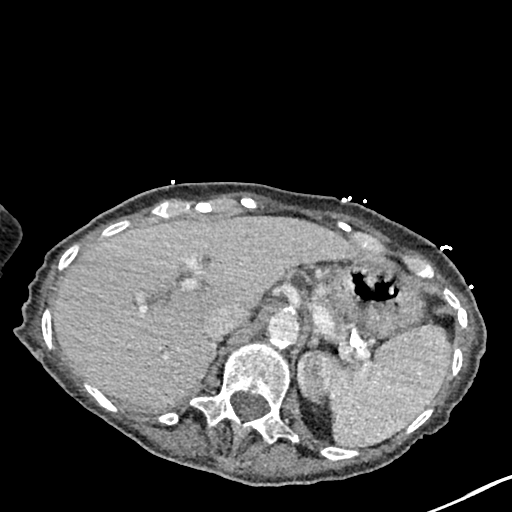
[im 11/143  lung]
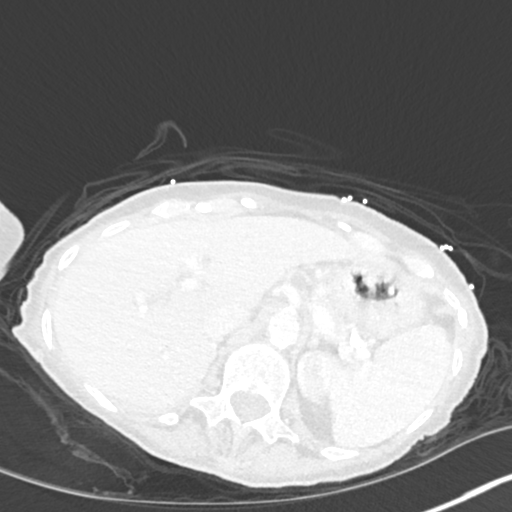
[im 22/143  lung]
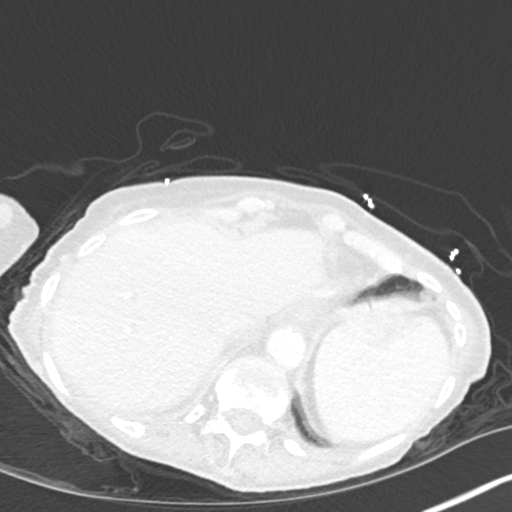
[im 33/143  lung]
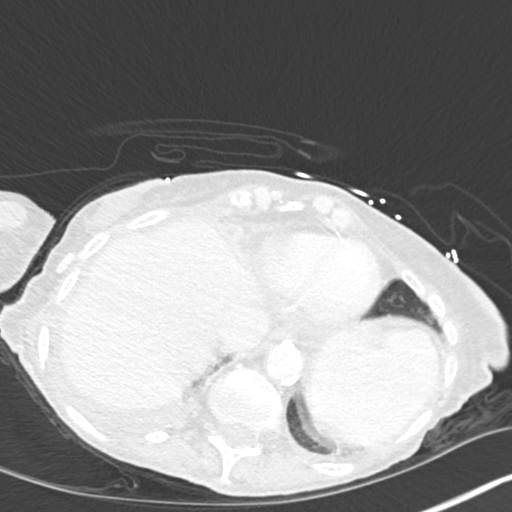
[im 44/143  lung]
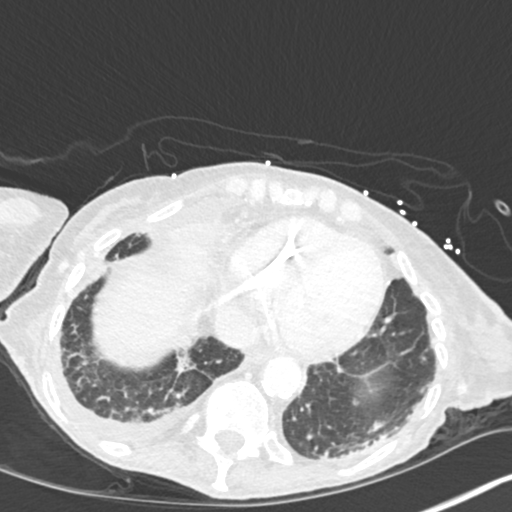
[im 55/143  mediastinal]
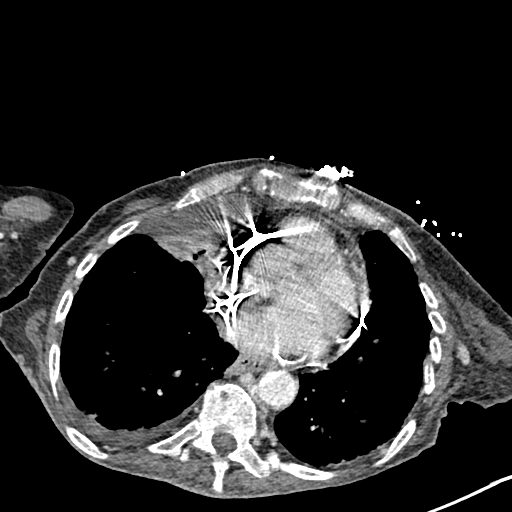
[im 55/143  lung]
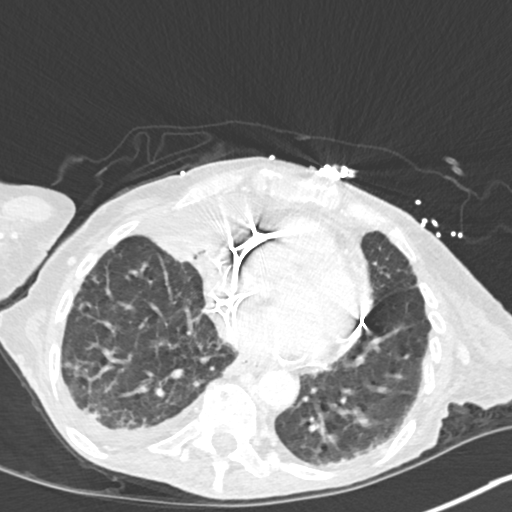
[im 77/143  lung]
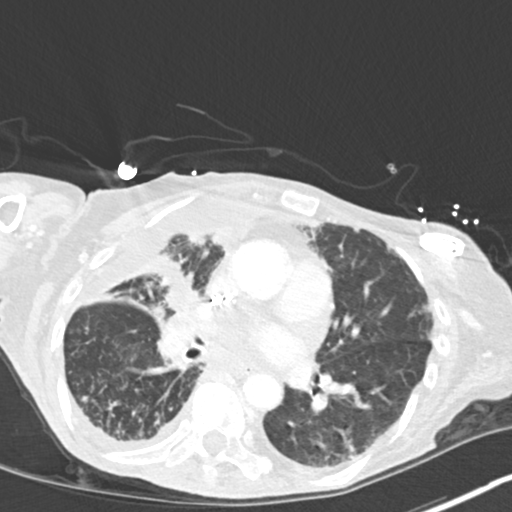
[im 88/143  lung]
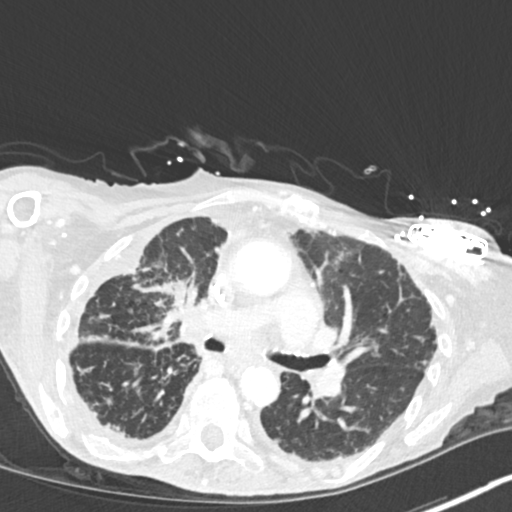
[im 99/143  lung]
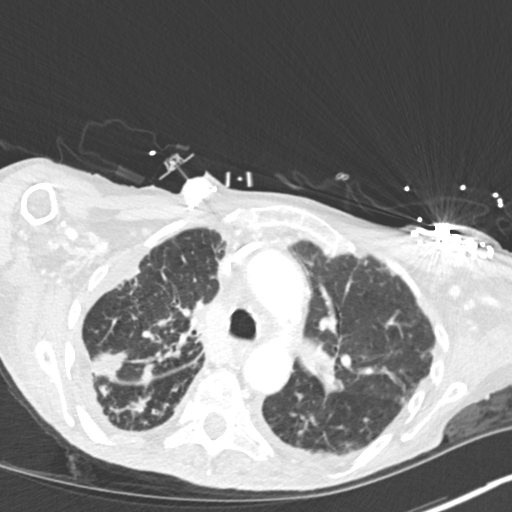
[im 110/143  mediastinal]
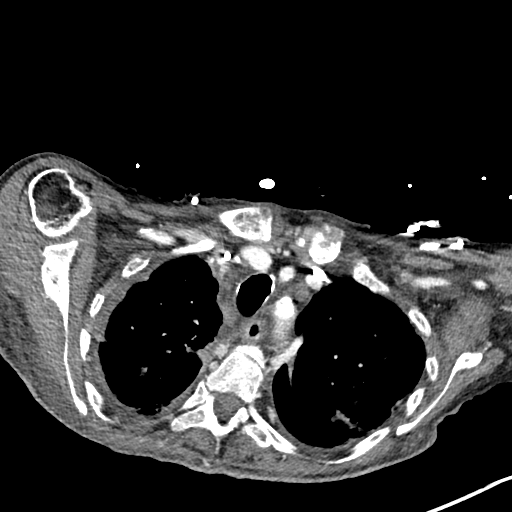
[im 110/143  lung]
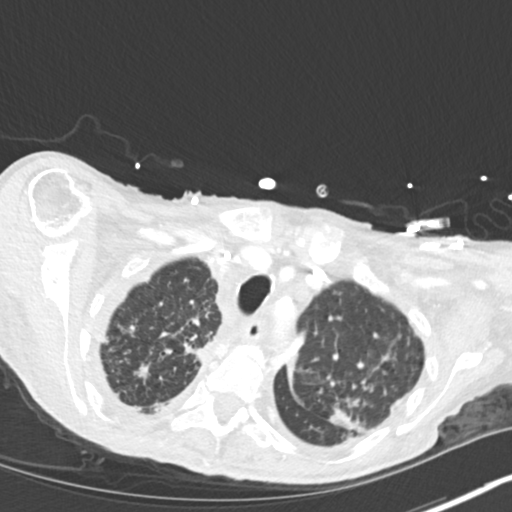
[im 121/143  lung]
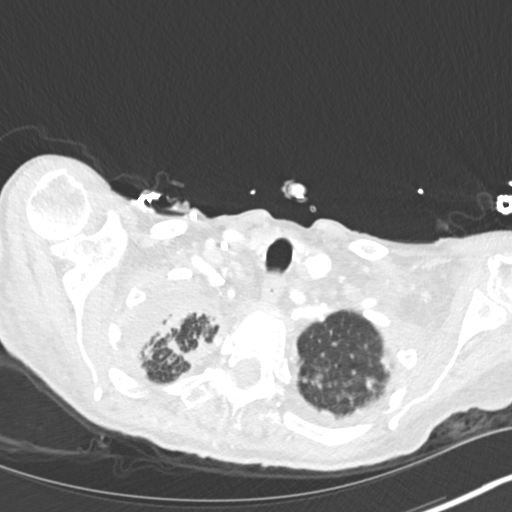
[im 132/143  lung]
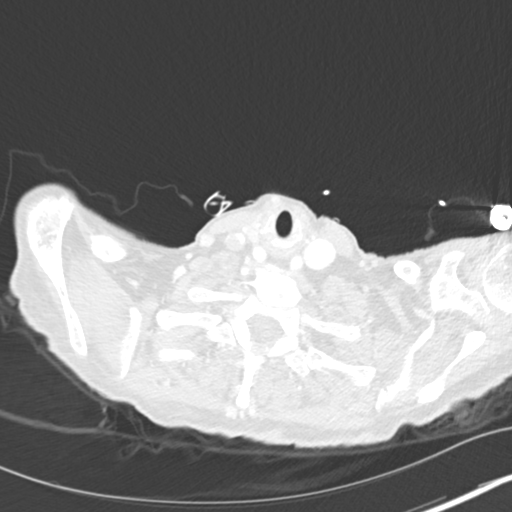

[Series 6: coronal · coronal · 0.57mm/px · 3 of 97 slices shown]
[im 20/97  lung]
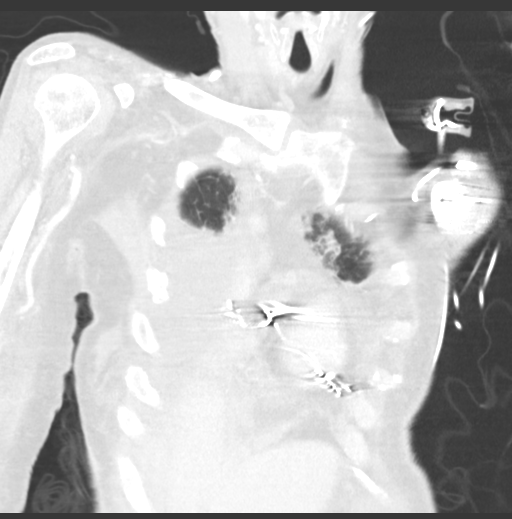
[im 39/97  lung]
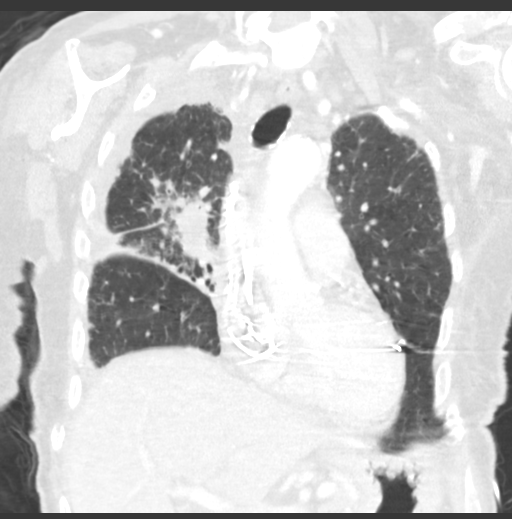
[im 58/97  lung]
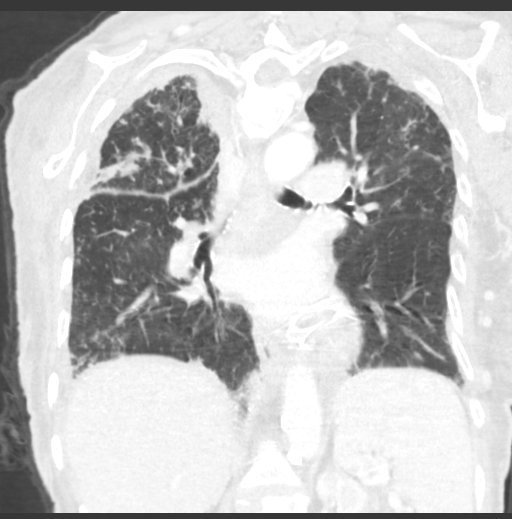

[14 of 36 positions shown; findings below may reference images not displayed]

RADIATION DOSE REDUCTION: This exam was performed according to the
departmental dose-optimization program which includes automated
exposure control, adjustment of the mA and/or kV according to
patient size and/or use of iterative reconstruction technique.

CONTRAST:  65mL OMNIPAQUE IOHEXOL 300 MG/ML  SOLN
FINDINGS: Cardiovascular: Extensive multi-vessel coronary artery
calcification. Global cardiac size within normal limits. Left
subclavian pacemaker in place with leads within the right heart and
left ventricular venous outflow. No pericardial effusion. The
central pulmonary arteries are enlarged in keeping with changes of
pulmonary arterial hypertension, stable since prior examination.
Moderate atherosclerotic calcification noted within the thoracic
aorta. No aortic aneurysm. Chronic thrombosis of the left
brachiocephalic vein. Right internal jugular chest port tip within
the right atrium.

Mediastinum/Nodes: The visualized thyroid is unremarkable. There is
shotty right paratracheal and pre-vascular adenopathy without
frankly pathologic enlargement. There is necrotic right hilar
adenopathy best seen on image # 74/2. The esophagus is unremarkable.

Lungs/Pleura: Right-sided volume loss with chronic collapse and
traction bronchiectasis of the right middle lobe again noted. There
has developed masslike consolidation within the anterior segment of
the right upper lobe un wit vaults in pleural retraction. This
involves an area of more focal consolidation on a prior examination
and measures up to 2.5 x 2.6 cm in size on axial image # 63/5. There
is associated partially loculated right pleural effusion noted
anteriorly. Asymmetric bronchial wall thickening noted within the
right upper lobe in keeping with airway inflammation.

There is again noted diffuse bronchiectasis, airway impaction, and
peripheral, scattered areas of subpleural reticulation and
consolidation most suggestive of chronic atypical infection and/or
infectious bronchiolitis. This is not well assessed due to
respiratory motion artifact, but appears grossly stable since prior
examination. No pneumothorax. No pleural effusion on the left.

Upper Abdomen: No acute abnormality.

Musculoskeletal: Lytic lesion has developed within the right seventh
rib posterolaterally the left clavicle both medially and distally as
well as throughout the thoracic spine best appreciated within T1.
Several additional foci of periosteal reaction or noted within
several right ribs suspicious for additional foci of metastatic
disease.
IMPRESSION: Interval development of masslike consolidation within the a right
upper lobe in an area of more focal consolidation noted on prior
examination suspicious for primary bronchogenic malignancy.
Associated shotty right pre-vascular, and periaortic adenopathy as
well as necrotic right hilar adenopathy. Interval development of
numerous lytic lesions throughout the visualized axial skeleton. PET
CT examination may be helpful for further evaluation.

Superimposed changes of a chronic atypical infection or infectious
bronchiolitis with bronchiolectasis, airway impaction, and
peripheral subpleural consolidation reticulation grossly stable
since prior examination.

Interval development of partially loculated right pleural effusion.
Diagnostic thoracentesis may be helpful for further management.

Extensive multi-vessel coronary artery calcification. Morphologic
changes in keeping with pulmonary arterial hypertension.

Aortic Atherosclerosis ([id number]).

## 2022-03-24 IMAGING — DX DG CHEST 1V PORT
1 series · 1 of 1 positions shown · non-contrast
Comparison: Previous studies including the examination of
05/18/2021

CLINICAL DATA: Status post thoracentesis

EXAM:
PORTABLE CHEST 1 VIEW

[chest ap]
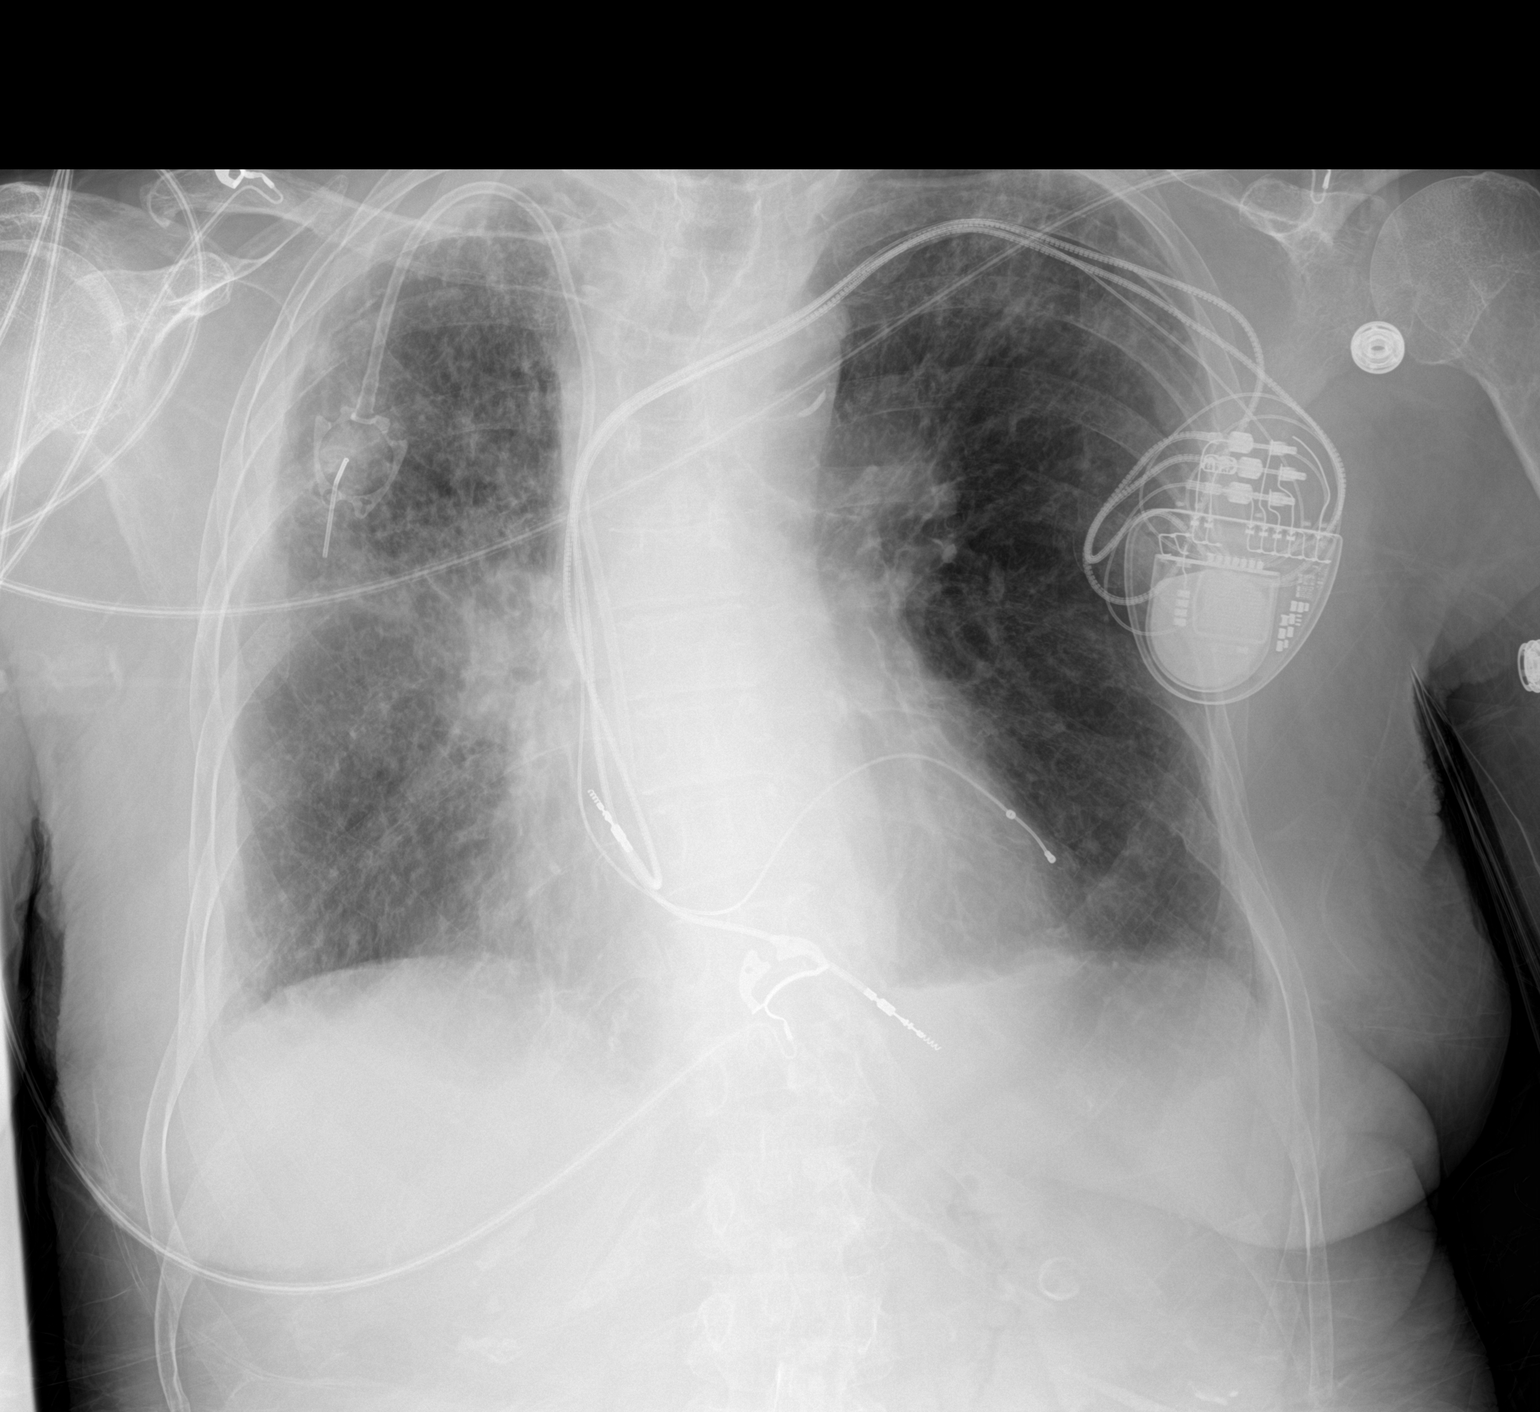

[1 of 1 positions shown; findings below may reference images not displayed]

FINDINGS: Cardiac size is within normal limits. Biventricular pacer leads are
noted in place. Tip of right IJ chest port is seen at the junction
of superior vena cava and right atrium. Increased interstitial
markings are seen in the left upper lung fields, right upper right
mid and right lower lung fields. There is blunting of right lateral
CP angle. There is questionable minimal blunting of left lateral CP
angle. Asymmetric pleural density is seen in the right apex with no
significant change. There is no pneumothorax.
IMPRESSION: Increased interstitial markings are seen in both lungs, more so on
the right side which may suggest underlying scarring and possibly
superimposed pneumonia. There is interval decrease in patchy
infiltrates in the right upper lung fields. There is small right
pleural effusion. Possible minimal left pleural effusion.

## 2022-03-25 IMAGING — DX DG CHEST 1V PORT
1 series · 1 of 1 positions shown · non-contrast
Comparison: 05/26/2021

CLINICAL DATA: Acute on chronic respiratory failure. Cough. COPD.
Silent aspiration.

EXAM:
PORTABLE CHEST 1 VIEW

[chest ap]
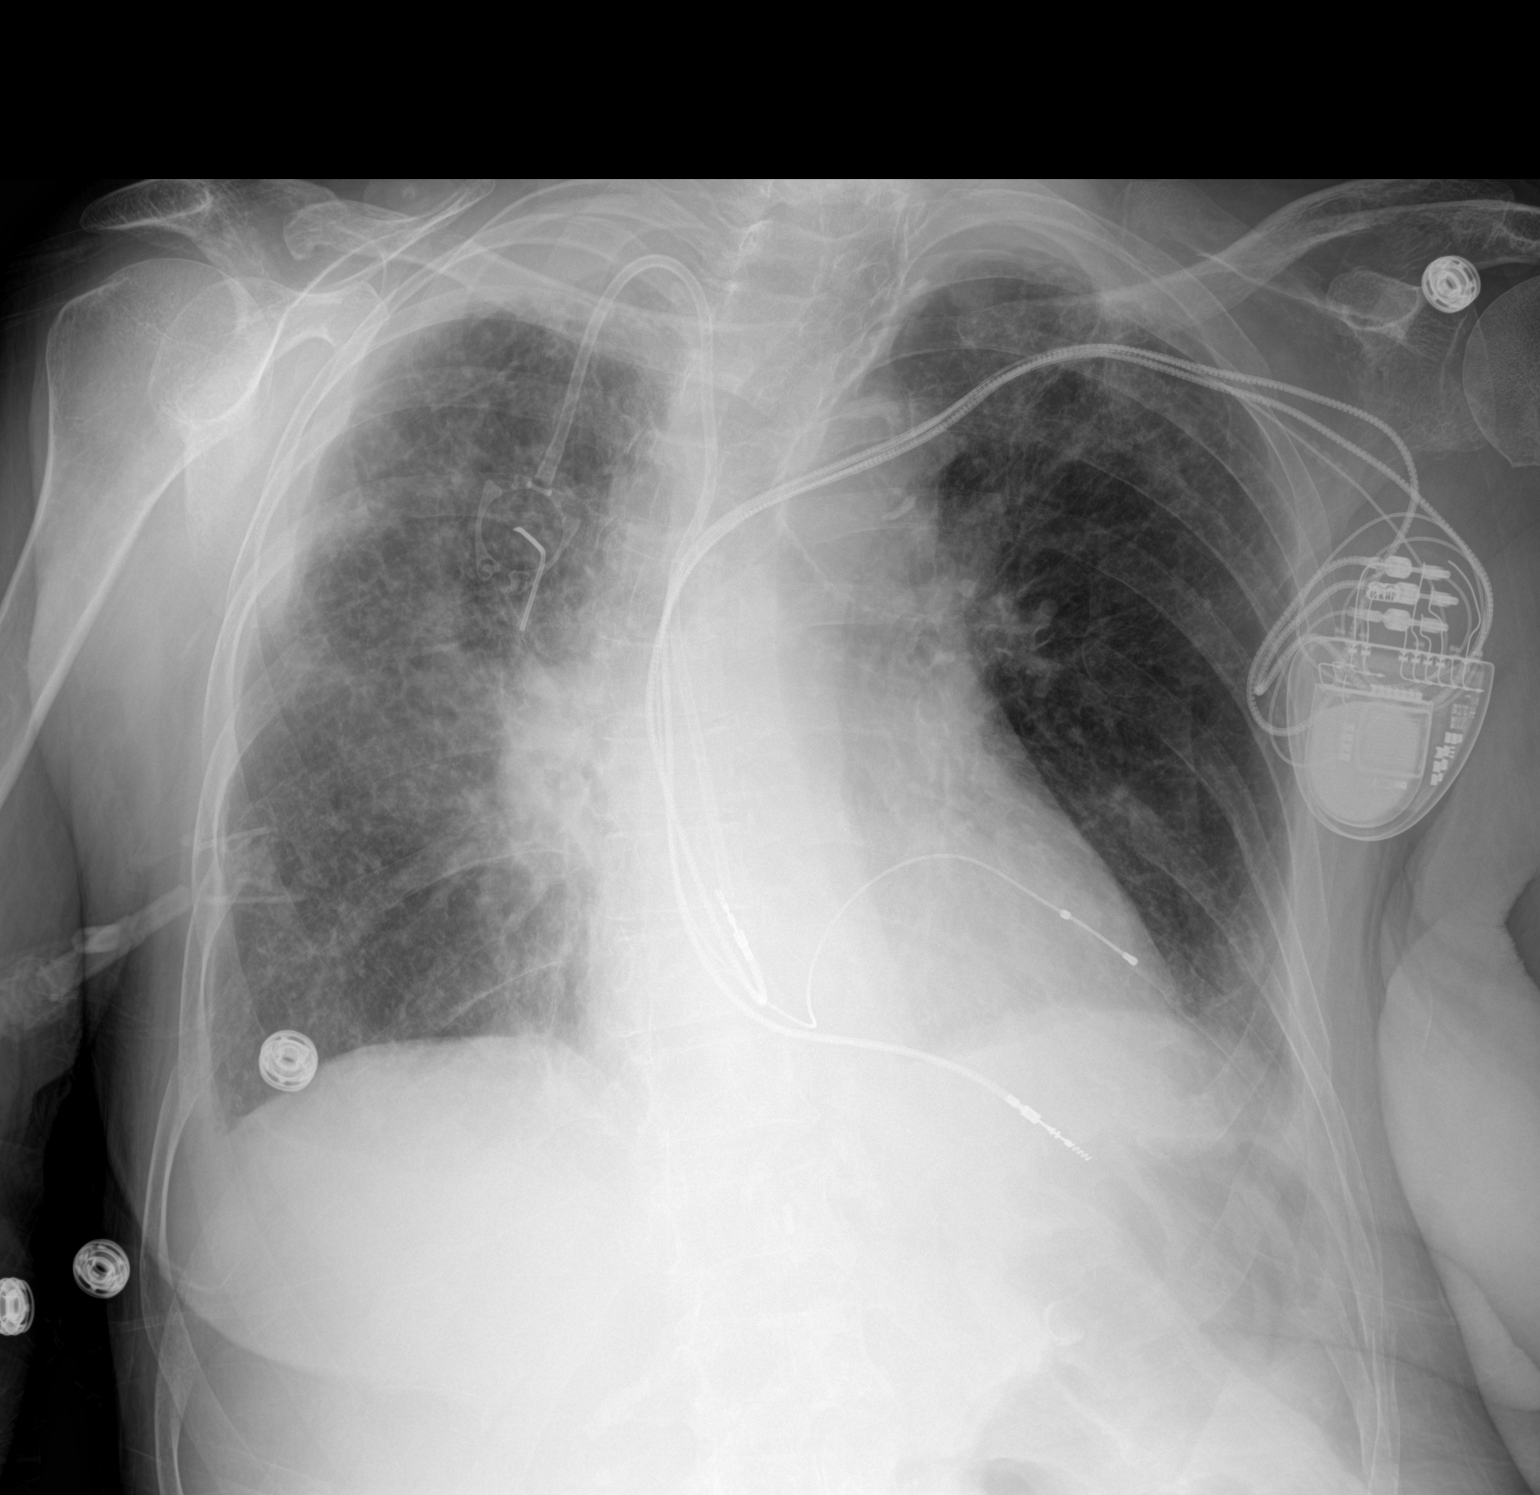

[1 of 1 positions shown; findings below may reference images not displayed]

FINDINGS: Heart size is within normal limits. Pacemaker and right-sided power
port remain in appropriate position. Aortic atherosclerotic
calcification noted.

Asymmetric masslike opacity is again noted in the right hilar
region. Small layering right pleural effusion is unchanged. Left
apical pleural-parenchymal scarring again noted. Thoracic
dextroscoliosis again seen.
IMPRESSION: No significant change in small layering right pleural effusion.

Asymmetric masslike opacity in right hilar region.

Stable left apical pleural-parenchymal scarring.
